# Patient Record
Sex: Female | Born: 1950
Health system: Southern US, Community
[De-identification: ages and names within clinical notes are randomized; demographics above are authoritative.]

## PROBLEM LIST (undated history)

## (undated) DIAGNOSIS — K429 Umbilical hernia without obstruction or gangrene: Secondary | ICD-10-CM

## (undated) DIAGNOSIS — I872 Venous insufficiency (chronic) (peripheral): Secondary | ICD-10-CM

## (undated) DIAGNOSIS — Z8719 Personal history of other diseases of the digestive system: Secondary | ICD-10-CM

## (undated) DIAGNOSIS — D649 Anemia, unspecified: Secondary | ICD-10-CM

## (undated) DIAGNOSIS — I340 Nonrheumatic mitral (valve) insufficiency: Secondary | ICD-10-CM

## (undated) DIAGNOSIS — K449 Diaphragmatic hernia without obstruction or gangrene: Secondary | ICD-10-CM

## (undated) DIAGNOSIS — G5793 Unspecified mononeuropathy of bilateral lower limbs: Secondary | ICD-10-CM

## (undated) DIAGNOSIS — E785 Hyperlipidemia, unspecified: Secondary | ICD-10-CM

## (undated) DIAGNOSIS — IMO0002 Reserved for concepts with insufficient information to code with codable children: Secondary | ICD-10-CM

## (undated) DIAGNOSIS — I739 Peripheral vascular disease, unspecified: Secondary | ICD-10-CM

## (undated) DIAGNOSIS — F32A Depression, unspecified: Secondary | ICD-10-CM

## (undated) DIAGNOSIS — I071 Rheumatic tricuspid insufficiency: Secondary | ICD-10-CM

## (undated) DIAGNOSIS — R011 Cardiac murmur, unspecified: Secondary | ICD-10-CM

## (undated) DIAGNOSIS — M109 Gout, unspecified: Secondary | ICD-10-CM

## (undated) DIAGNOSIS — M1990 Unspecified osteoarthritis, unspecified site: Secondary | ICD-10-CM

## (undated) DIAGNOSIS — F329 Major depressive disorder, single episode, unspecified: Secondary | ICD-10-CM

## (undated) DIAGNOSIS — R0600 Dyspnea, unspecified: Secondary | ICD-10-CM

## (undated) DIAGNOSIS — F1190 Opioid use, unspecified, uncomplicated: Secondary | ICD-10-CM

## (undated) DIAGNOSIS — T8859XA Other complications of anesthesia, initial encounter: Secondary | ICD-10-CM

## (undated) DIAGNOSIS — I509 Heart failure, unspecified: Secondary | ICD-10-CM

## (undated) DIAGNOSIS — R32 Unspecified urinary incontinence: Secondary | ICD-10-CM

## (undated) DIAGNOSIS — I69354 Hemiplegia and hemiparesis following cerebral infarction affecting left non-dominant side: Secondary | ICD-10-CM

## (undated) DIAGNOSIS — G4733 Obstructive sleep apnea (adult) (pediatric): Secondary | ICD-10-CM

## (undated) DIAGNOSIS — R760 Raised antibody titer: Secondary | ICD-10-CM

## (undated) DIAGNOSIS — F419 Anxiety disorder, unspecified: Secondary | ICD-10-CM

## (undated) DIAGNOSIS — N183 Chronic kidney disease, stage 3 unspecified: Secondary | ICD-10-CM

## (undated) DIAGNOSIS — I779 Disorder of arteries and arterioles, unspecified: Secondary | ICD-10-CM

## (undated) DIAGNOSIS — I480 Paroxysmal atrial fibrillation: Secondary | ICD-10-CM

## (undated) DIAGNOSIS — R0789 Other chest pain: Secondary | ICD-10-CM

## (undated) DIAGNOSIS — J45909 Unspecified asthma, uncomplicated: Secondary | ICD-10-CM

## (undated) DIAGNOSIS — E039 Hypothyroidism, unspecified: Secondary | ICD-10-CM

## (undated) DIAGNOSIS — E119 Type 2 diabetes mellitus without complications: Secondary | ICD-10-CM

## (undated) DIAGNOSIS — I82409 Acute embolism and thrombosis of unspecified deep veins of unspecified lower extremity: Secondary | ICD-10-CM

## (undated) DIAGNOSIS — I2721 Secondary pulmonary arterial hypertension: Secondary | ICD-10-CM

## (undated) DIAGNOSIS — K760 Fatty (change of) liver, not elsewhere classified: Secondary | ICD-10-CM

## (undated) DIAGNOSIS — Z87442 Personal history of urinary calculi: Secondary | ICD-10-CM

## (undated) DIAGNOSIS — R609 Edema, unspecified: Secondary | ICD-10-CM

## (undated) DIAGNOSIS — Z8614 Personal history of Methicillin resistant Staphylococcus aureus infection: Secondary | ICD-10-CM

## (undated) DIAGNOSIS — G4730 Sleep apnea, unspecified: Secondary | ICD-10-CM

## (undated) DIAGNOSIS — G4700 Insomnia, unspecified: Secondary | ICD-10-CM

## (undated) DIAGNOSIS — IMO0001 Reserved for inherently not codable concepts without codable children: Secondary | ICD-10-CM

## (undated) DIAGNOSIS — I639 Cerebral infarction, unspecified: Secondary | ICD-10-CM

## (undated) DIAGNOSIS — I5032 Chronic diastolic (congestive) heart failure: Secondary | ICD-10-CM

## (undated) DIAGNOSIS — I82401 Acute embolism and thrombosis of unspecified deep veins of right lower extremity: Secondary | ICD-10-CM

## (undated) DIAGNOSIS — I10 Essential (primary) hypertension: Secondary | ICD-10-CM

## (undated) DIAGNOSIS — Z7901 Long term (current) use of anticoagulants: Secondary | ICD-10-CM

## (undated) DIAGNOSIS — Z8673 Personal history of transient ischemic attack (TIA), and cerebral infarction without residual deficits: Secondary | ICD-10-CM

## (undated) DIAGNOSIS — N200 Calculus of kidney: Secondary | ICD-10-CM

## (undated) DIAGNOSIS — L97529 Non-pressure chronic ulcer of other part of left foot with unspecified severity: Secondary | ICD-10-CM

## (undated) DIAGNOSIS — K219 Gastro-esophageal reflux disease without esophagitis: Secondary | ICD-10-CM

## (undated) DIAGNOSIS — I499 Cardiac arrhythmia, unspecified: Secondary | ICD-10-CM

## (undated) HISTORY — DX: Chronic kidney disease, stage 3 unspecified: N18.30

## (undated) HISTORY — DX: Hypothyroidism, unspecified: E03.9

## (undated) HISTORY — DX: Anxiety disorder, unspecified: F41.9

## (undated) HISTORY — DX: Other chest pain: R07.89

## (undated) HISTORY — DX: Nonrheumatic mitral (valve) insufficiency: I34.0

## (undated) HISTORY — PX: HERNIA REPAIR: SHX51

## (undated) HISTORY — DX: Rheumatic tricuspid insufficiency: I07.1

## (undated) HISTORY — PX: LITHOTRIPSY: SUR834

## (undated) HISTORY — DX: Non-pressure chronic ulcer of other part of left foot with unspecified severity: L97.529

## (undated) HISTORY — DX: Chronic diastolic (congestive) heart failure: I50.32

## (undated) HISTORY — DX: Insomnia, unspecified: G47.00

## (undated) HISTORY — DX: Acute embolism and thrombosis of unspecified deep veins of unspecified lower extremity: I82.409

## (undated) HISTORY — PX: UMBILICAL HERNIA REPAIR: SHX196

## (undated) HISTORY — DX: Venous insufficiency (chronic) (peripheral): I87.2

## (undated) HISTORY — DX: Reserved for concepts with insufficient information to code with codable children: IMO0002

## (undated) HISTORY — DX: Depression, unspecified: F32.A

## (undated) HISTORY — PX: CHOLECYSTECTOMY: SHX55

## (undated) HISTORY — DX: Paroxysmal atrial fibrillation: I48.0

## (undated) HISTORY — PX: KIDNEY STONE SURGERY: SHX686

## (undated) HISTORY — DX: Gastro-esophageal reflux disease without esophagitis: K21.9

## (undated) HISTORY — DX: Calculus of kidney: N20.0

## (undated) HISTORY — PX: TOTAL SHOULDER REPLACEMENT: SUR1217

## (undated) HISTORY — DX: Personal history of transient ischemic attack (TIA), and cerebral infarction without residual deficits: Z86.73

## (undated) HISTORY — DX: Unspecified osteoarthritis, unspecified site: M19.90

## (undated) HISTORY — DX: Secondary pulmonary arterial hypertension: I27.21

## (undated) HISTORY — PX: COLONOSCOPY: SHX174

## (undated) HISTORY — PX: TOTAL KNEE ARTHROPLASTY: SHX125

## (undated) HISTORY — PX: TONSILLECTOMY: SUR1361

## (undated) HISTORY — DX: Disorder of arteries and arterioles, unspecified: I77.9

## (undated) HISTORY — DX: Cardiac murmur, unspecified: R01.1

## (undated) HISTORY — PX: OTHER SURGICAL HISTORY: SHX169

## (undated) HISTORY — DX: Sleep apnea, unspecified: G47.30

## (undated) HISTORY — DX: Unspecified asthma, uncomplicated: J45.909

## (undated) HISTORY — DX: Reserved for inherently not codable concepts without codable children: IMO0001

## (undated) HISTORY — PX: KIDNEY SURGERY: SHX687

## (undated) HISTORY — DX: Type 2 diabetes mellitus without complications: E11.9

## (undated) HISTORY — DX: Major depressive disorder, single episode, unspecified: F32.9

## (undated) HISTORY — DX: Unspecified mononeuropathy of bilateral lower limbs: G57.93

## (undated) HISTORY — PX: ANKLE SURGERY: SHX546

## (undated) HISTORY — PX: HIATAL HERNIA REPAIR: SHX195

## (undated) HISTORY — PX: VAGINAL HYSTERECTOMY: SUR661

## (undated) HISTORY — PX: UPPER GI ENDOSCOPY: SHX6162

## (undated) HISTORY — DX: Cerebral infarction, unspecified: I63.9

## (undated) HISTORY — PX: SLEEVE GASTROPLASTY: SHX1101

---

## 2003-07-16 ENCOUNTER — Other Ambulatory Visit: Payer: Self-pay

## 2003-10-30 ENCOUNTER — Other Ambulatory Visit: Payer: Self-pay

## 2003-12-26 ENCOUNTER — Ambulatory Visit: Payer: Self-pay | Admitting: Anesthesiology

## 2004-02-06 ENCOUNTER — Ambulatory Visit: Payer: Self-pay | Admitting: Anesthesiology

## 2004-02-08 ENCOUNTER — Other Ambulatory Visit: Payer: Self-pay

## 2004-02-12 ENCOUNTER — Ambulatory Visit: Payer: Self-pay

## 2004-04-15 ENCOUNTER — Ambulatory Visit: Payer: Self-pay | Admitting: Internal Medicine

## 2004-04-19 ENCOUNTER — Ambulatory Visit: Payer: Self-pay | Admitting: Internal Medicine

## 2004-05-20 ENCOUNTER — Ambulatory Visit: Payer: Self-pay | Admitting: Anesthesiology

## 2004-09-16 ENCOUNTER — Ambulatory Visit: Payer: Self-pay | Admitting: Urology

## 2004-09-16 ENCOUNTER — Ambulatory Visit: Payer: Self-pay | Admitting: Anesthesiology

## 2004-12-17 ENCOUNTER — Ambulatory Visit: Payer: Self-pay | Admitting: Anesthesiology

## 2004-12-22 ENCOUNTER — Inpatient Hospital Stay: Payer: Self-pay | Admitting: Internal Medicine

## 2005-01-14 ENCOUNTER — Encounter: Admission: RE | Admit: 2005-01-14 | Discharge: 2005-01-14 | Payer: Self-pay | Admitting: Internal Medicine

## 2005-03-23 DIAGNOSIS — I480 Paroxysmal atrial fibrillation: Secondary | ICD-10-CM

## 2005-03-23 HISTORY — DX: Paroxysmal atrial fibrillation: I48.0

## 2005-06-02 ENCOUNTER — Ambulatory Visit: Payer: Self-pay | Admitting: Anesthesiology

## 2005-07-03 ENCOUNTER — Ambulatory Visit: Payer: Self-pay | Admitting: Urology

## 2005-07-16 ENCOUNTER — Other Ambulatory Visit: Payer: Self-pay

## 2005-07-16 ENCOUNTER — Ambulatory Visit: Payer: Self-pay | Admitting: Urology

## 2005-08-24 ENCOUNTER — Ambulatory Visit: Payer: Self-pay | Admitting: Urology

## 2005-11-09 ENCOUNTER — Ambulatory Visit: Payer: Self-pay | Admitting: Anesthesiology

## 2005-11-17 ENCOUNTER — Other Ambulatory Visit: Payer: Self-pay

## 2005-11-19 ENCOUNTER — Ambulatory Visit: Payer: Self-pay | Admitting: Urology

## 2005-12-14 ENCOUNTER — Ambulatory Visit: Payer: Self-pay | Admitting: Urology

## 2006-02-03 ENCOUNTER — Ambulatory Visit: Payer: Self-pay | Admitting: Specialist

## 2006-02-09 ENCOUNTER — Ambulatory Visit: Payer: Self-pay | Admitting: Internal Medicine

## 2006-03-23 DIAGNOSIS — Z8614 Personal history of Methicillin resistant Staphylococcus aureus infection: Secondary | ICD-10-CM

## 2006-03-23 HISTORY — DX: Personal history of Methicillin resistant Staphylococcus aureus infection: Z86.14

## 2006-04-15 ENCOUNTER — Ambulatory Visit: Payer: Self-pay | Admitting: Internal Medicine

## 2006-05-03 ENCOUNTER — Ambulatory Visit: Payer: Self-pay | Admitting: Gastroenterology

## 2006-05-10 ENCOUNTER — Ambulatory Visit: Payer: Self-pay | Admitting: Gastroenterology

## 2006-05-12 ENCOUNTER — Ambulatory Visit: Payer: Self-pay | Admitting: Anesthesiology

## 2006-08-18 ENCOUNTER — Ambulatory Visit: Payer: Self-pay | Admitting: Anesthesiology

## 2006-10-06 ENCOUNTER — Ambulatory Visit: Payer: Self-pay | Admitting: Anesthesiology

## 2006-11-08 ENCOUNTER — Ambulatory Visit: Payer: Self-pay | Admitting: Urology

## 2006-11-24 ENCOUNTER — Ambulatory Visit: Payer: Self-pay | Admitting: Anesthesiology

## 2006-12-08 ENCOUNTER — Ambulatory Visit: Payer: Self-pay | Admitting: Anesthesiology

## 2006-12-08 ENCOUNTER — Other Ambulatory Visit: Payer: Self-pay

## 2006-12-08 ENCOUNTER — Ambulatory Visit: Payer: Self-pay | Admitting: Urology

## 2006-12-09 ENCOUNTER — Ambulatory Visit: Payer: Self-pay | Admitting: Urology

## 2007-01-14 ENCOUNTER — Ambulatory Visit: Payer: Self-pay | Admitting: Internal Medicine

## 2007-02-14 ENCOUNTER — Ambulatory Visit: Payer: Self-pay | Admitting: Anesthesiology

## 2007-02-22 ENCOUNTER — Ambulatory Visit: Payer: Self-pay | Admitting: Gastroenterology

## 2007-03-11 IMAGING — CR DG CHEST 2V
1 series · 2 of 2 positions shown · non-contrast
Comparison: none

REASON FOR EXAM: pneumonia
COMMENTS:

[Series 1: view not recorded · 0.17mm/px · 2 of 2 slices shown]
[im 1/2]
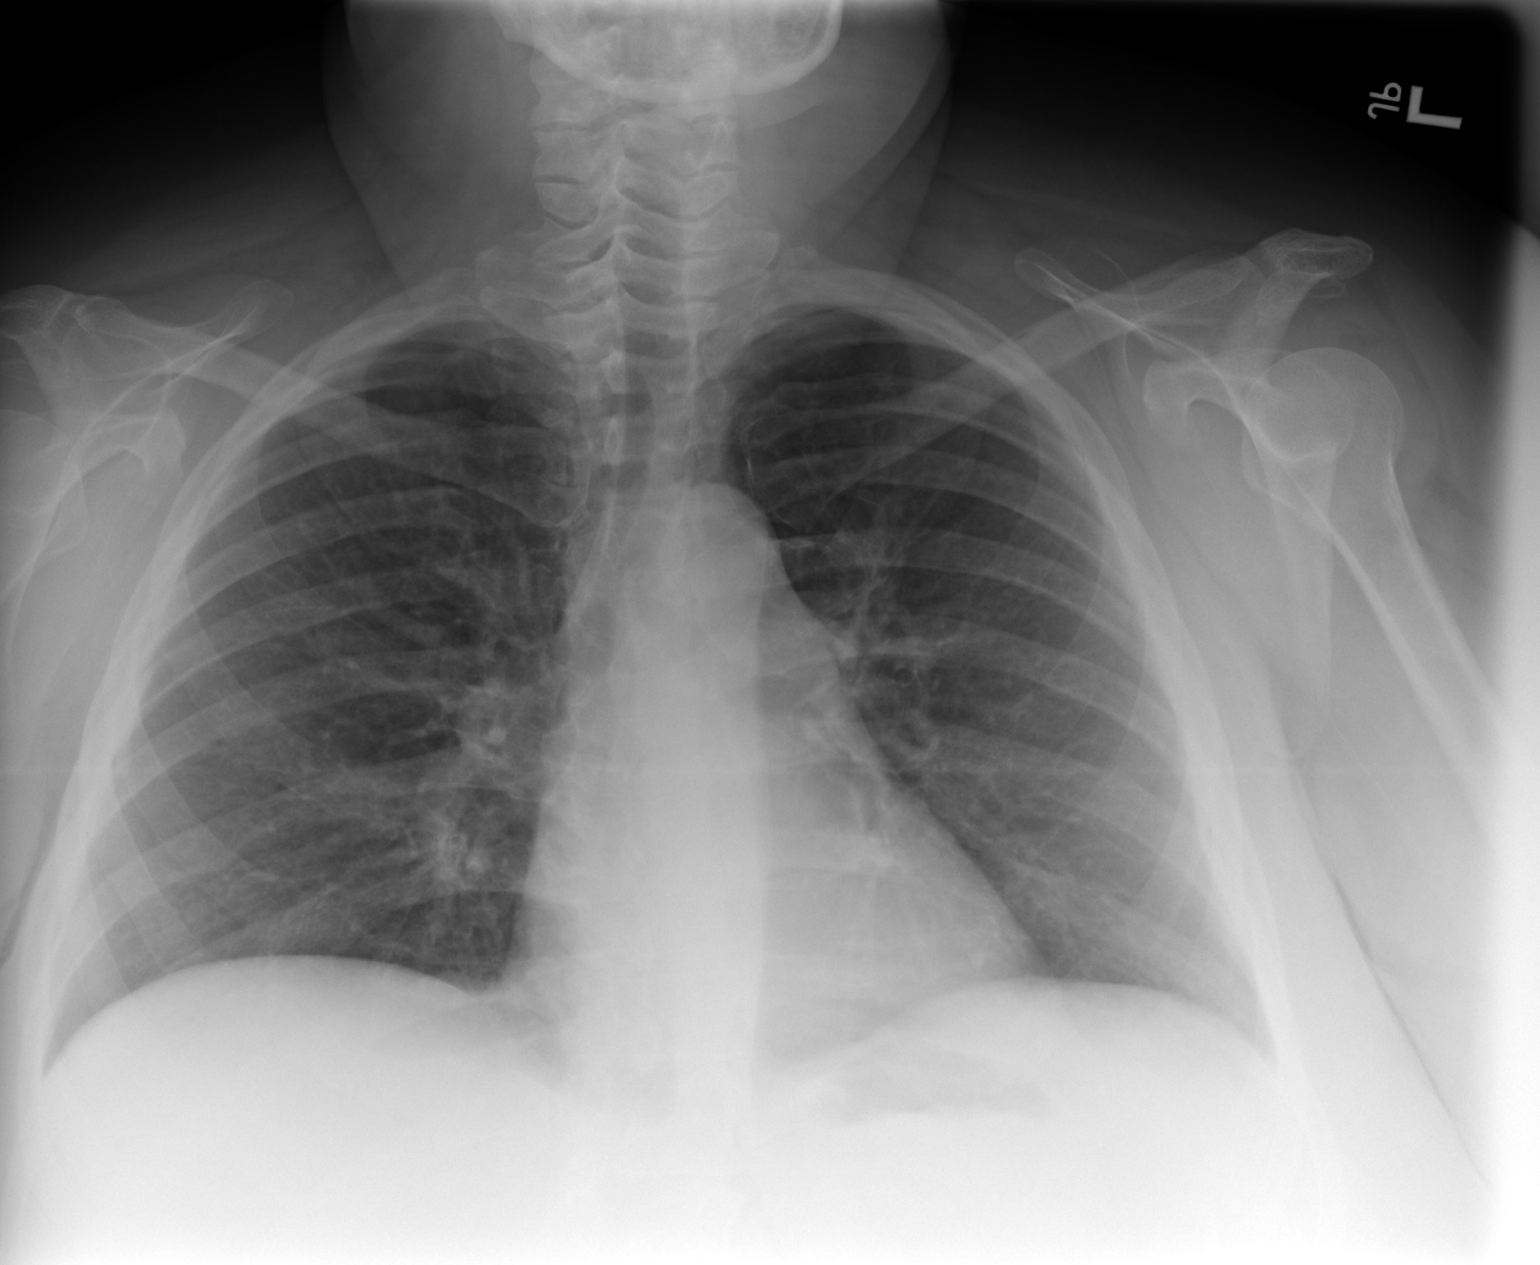
[im 2/2]
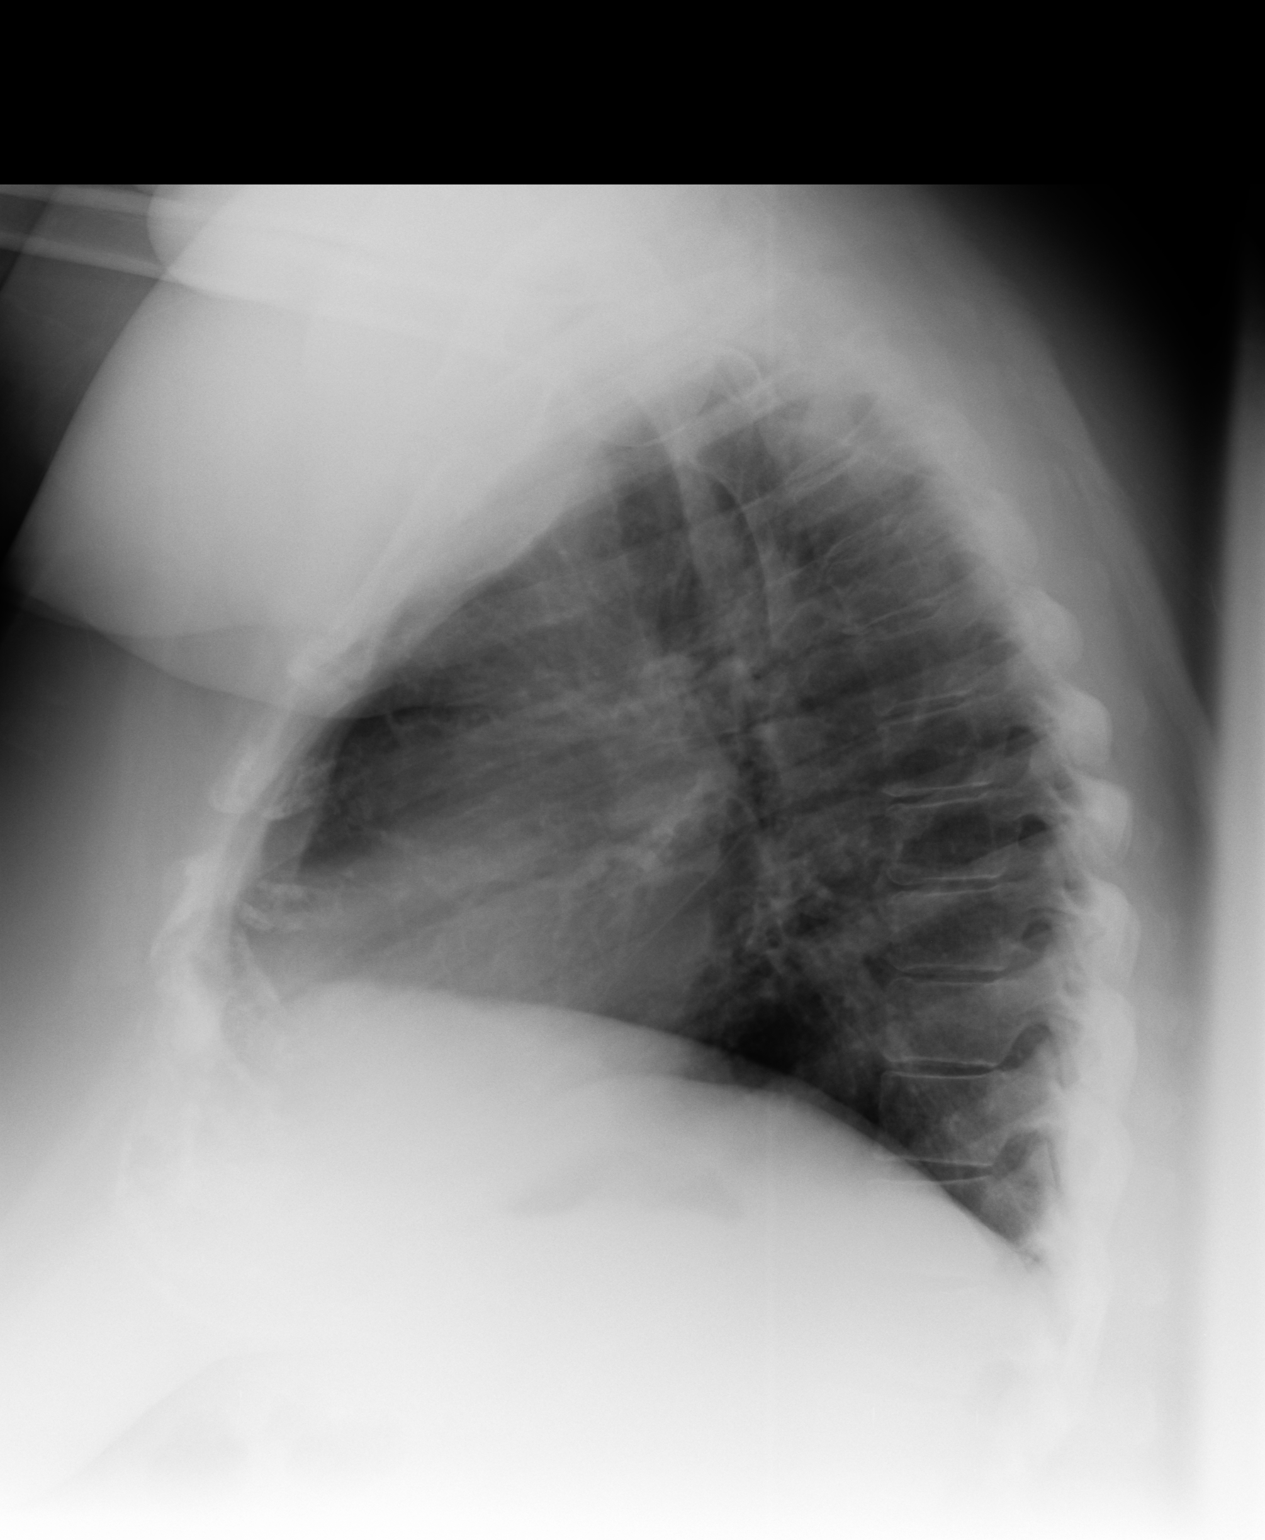

[2 of 2 positions shown; findings below may reference images not displayed]

PROCEDURE:     DXR - DXR CHEST PA (OR AP) AND LATERAL  - December 22, 2004  [DATE]

RESULT:     The current exam is compared to prior exam of 01-07-03.  The
lung fields are clear. Radiographically, no pneumonia is identified on the
current exam.  No pleural effusion or pneumothorax is seen. The heart,
mediastinal and osseous structures are normal in appearance.
IMPRESSION: 1)No significant abnormalities are noted.

## 2007-03-13 IMAGING — CT CT HEAD WITHOUT AND WITH CONTRAST
1 of 2 series · 13 of 30 positions shown, 17 images · non-contrast
Comparison: none

REASON FOR EXAM: Headache
COMMENTS:

[Series 2: without 5.0 h40s · axial · non-contrast · 0.40mm/px · z∈[+218,+343]mm · 13 of 31 slices shown, 17 images]
[im 3/31  brain]
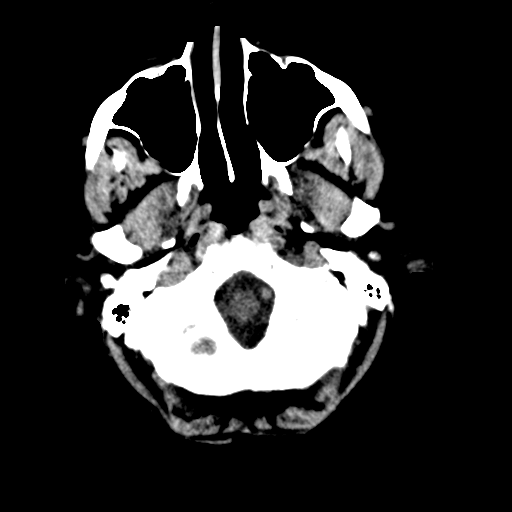
[im 3/31  bone]
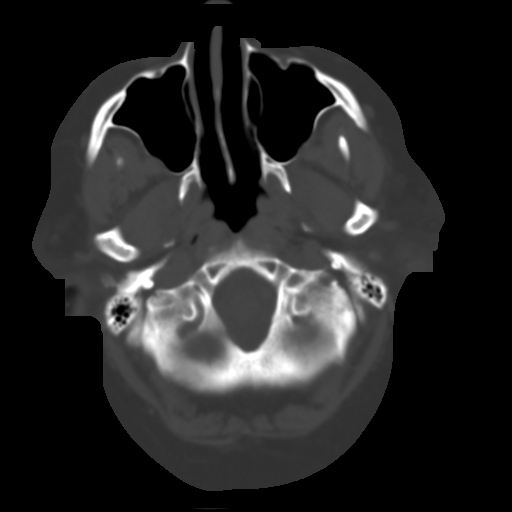
[im 5/31  brain]
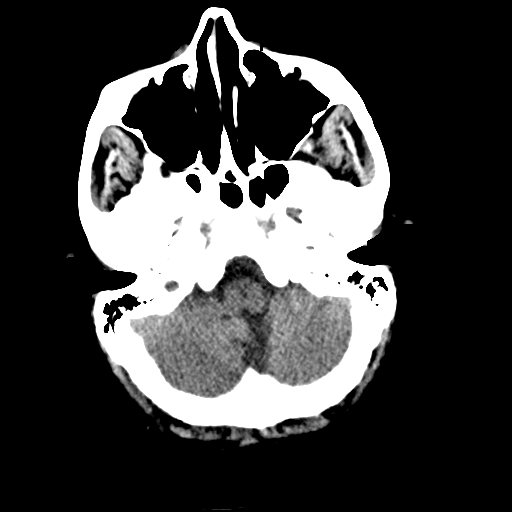
[im 7/31  brain]
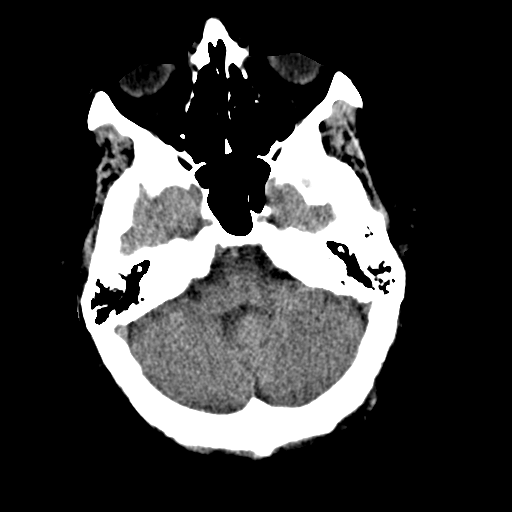
[im 9/31  brain]
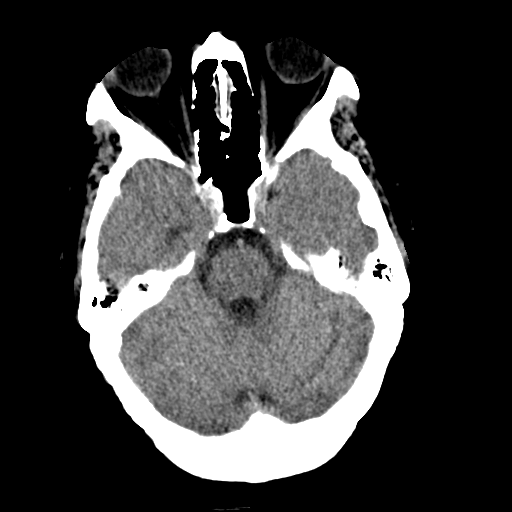
[im 11/31  brain]
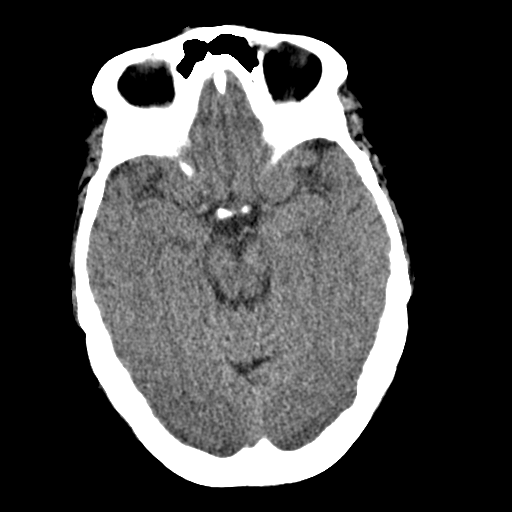
[im 11/31  bone]
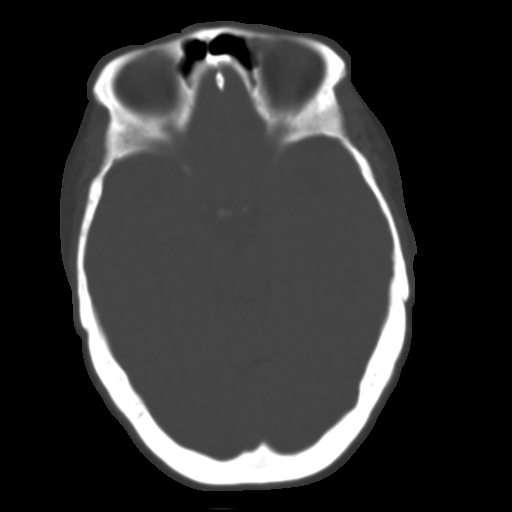
[im 13/31  brain]
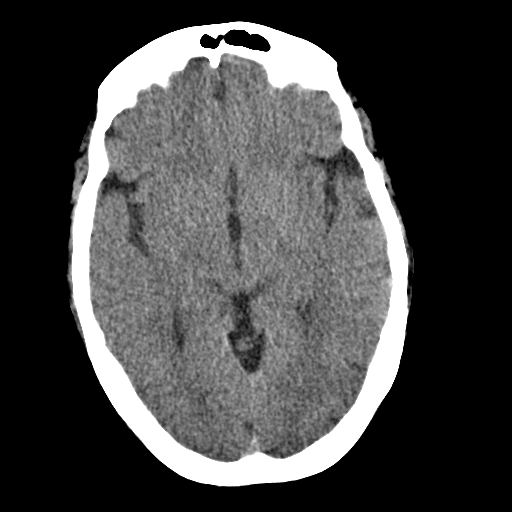
[im 16/31  brain]
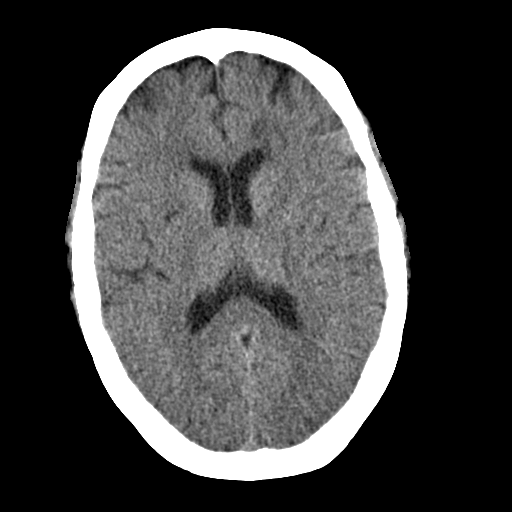
[im 18/31  brain]
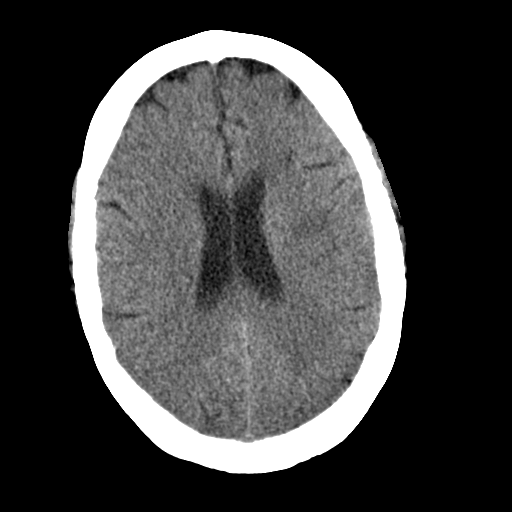
[im 20/31  brain]
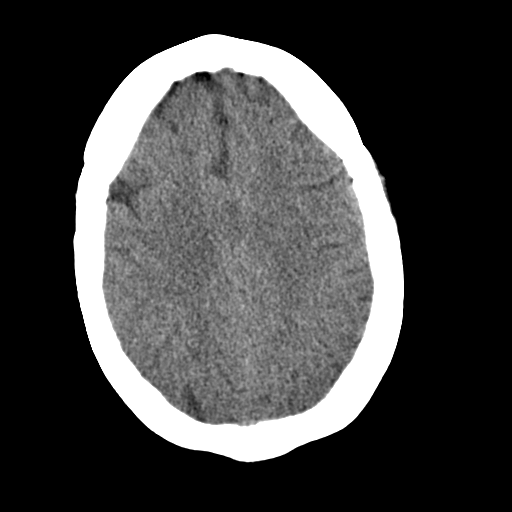
[im 20/31  bone]
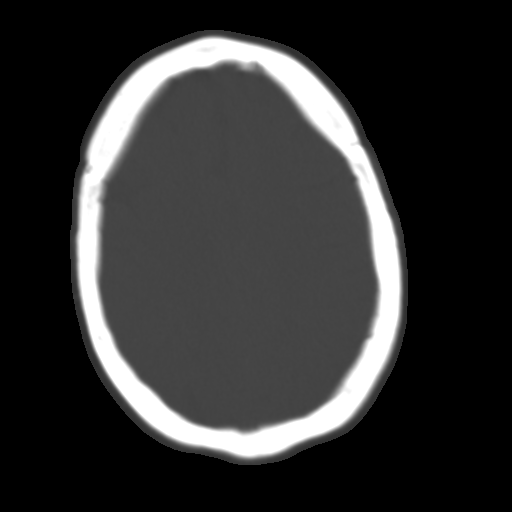
[im 22/31  brain]
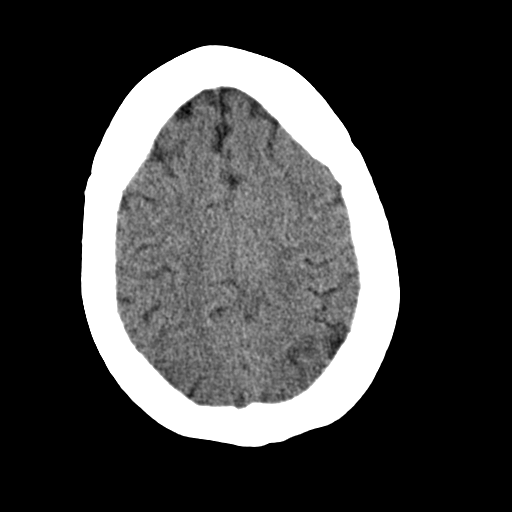
[im 24/31  brain]
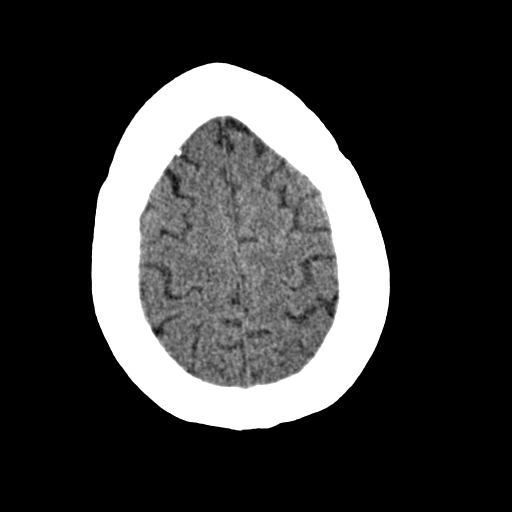
[im 26/31  brain]
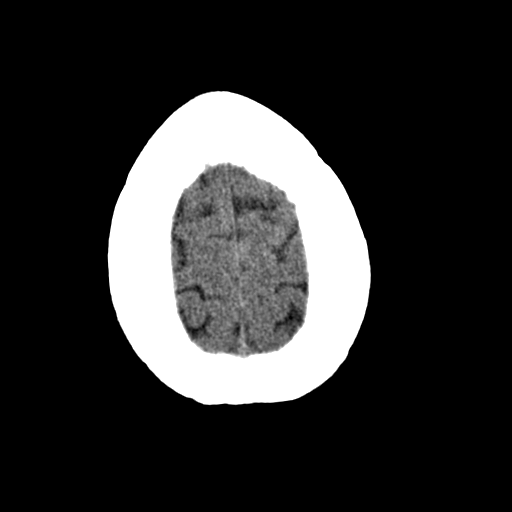
[im 28/31  brain]
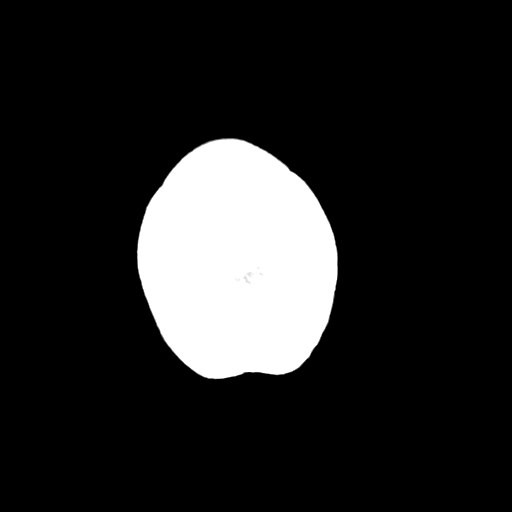
[im 28/31  bone]
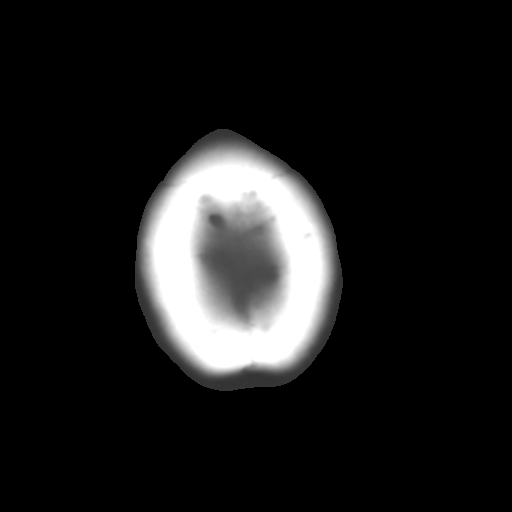

[13 of 30 positions shown; findings below may reference images not displayed]

PROCEDURE:     CT  - CT HEAD W/WO  - December 24, 2004 [DATE]

RESULT:     Pre and post contrast images were obtained of the head pre and
immediate intravenous administration of 50 cc's of Tsovue-DJJ.

There is no evidence of acute hemorrhage. An area of low attenuation
projects along the LEFT apical insular cortex. This does not extend to the
periphery of the gray matter area and appears to be within the white matter
likely representing a region of focal white matter ischemia. A focal region
of infarction other than small vessel ischemia cannot be completely
excluded. A second area of focal low attenuation projects along the anterior
region of the external capsule on the RIGHT. This appears to have the
appearance consistent with a focal region of lacunar infarction. This area
appears subacute to chronic while the area described on the LEFT appears
more subacute. Age related changes are evident demonstrated by mild cortical
atrophy and mild periventricular small vessel white matter ischemia.

Evaluation of the brain status post contrast administration demonstrates no
evidence of enhancing masses or regions of abnormal parenchymal enhancement.
IMPRESSION: 1.     Age related changes.
2.     Findings either representing focal regions of small vessel white
matter disease/lacunar infarction particularly on the RIGHT versus focal
areas of nonlacunar infarction particularly on the LEFT. The region on the
LEFT appears to be more subacute than the RIGHT.

## 2007-04-03 IMAGING — MR MR HEAD WO/W CM
8 of 9 series · 32 of 48 positions shown · IV contrast (omniscan)
Comparison: none

CLINICAL DATA: 54 year old female with headaches and abnormal CT scan performed [HOSPITAL], dated 12/24/04.
MRI BRAIN WITHOUT AND WITH CONTRAST:
TECHNIQUE: Multiplanar and multiecho pulse sequences of the brain and surrounding structures were obtained according to standard protocol before and after administration of intravenous contrast.
Contrast:  20 cc Omniscan.

[Series 2: t1_se_sag · sagittal · 5.0mm · 0.45mm/px · 2 of 19 slices shown]
[im 1/19]
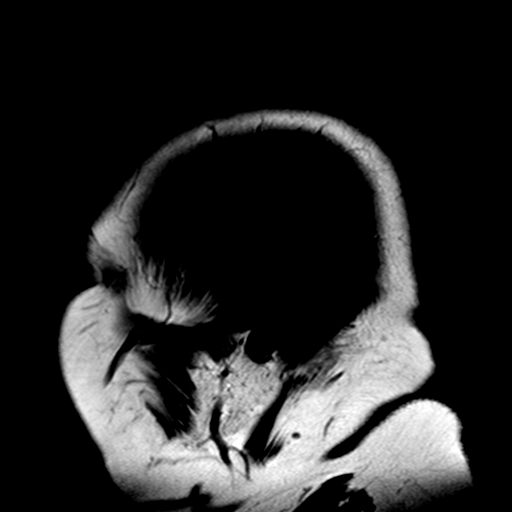
[im 19/19]
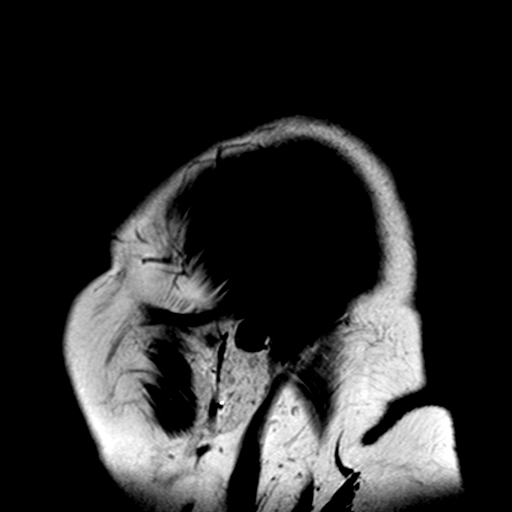

[Series 3: t2_tse_tra_512 · axial · 5.0mm · 0.45mm/px · z∈[-47,+83]mm · 3 of 21 slices shown]
[im 1/21]
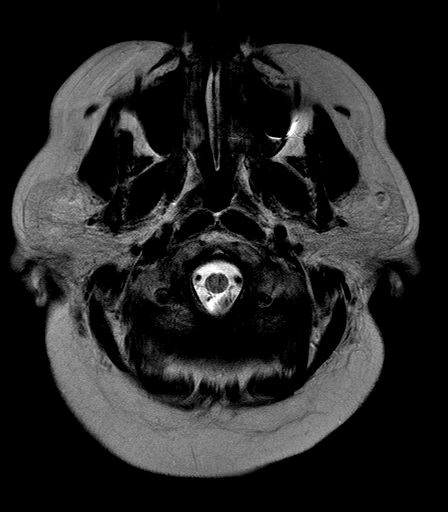
[im 11/21]
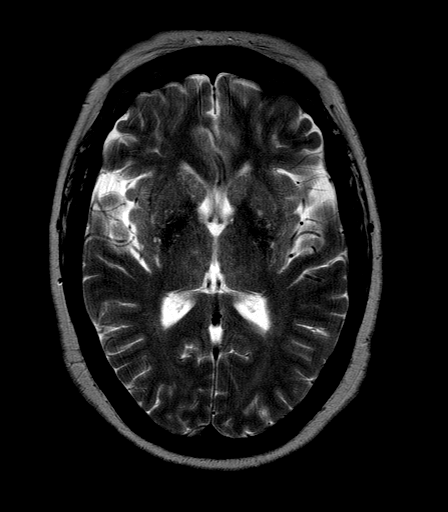
[im 21/21]
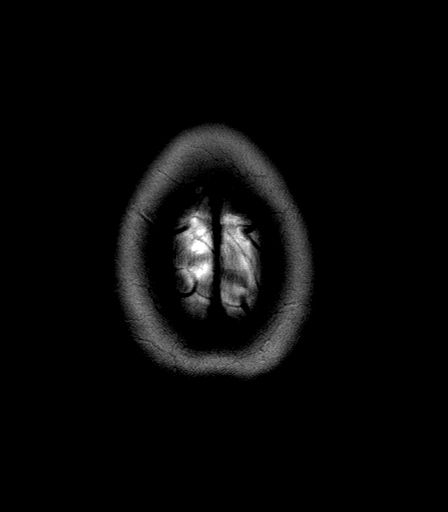

[Series 4: FLAIR · axial · 5.0mm · 0.45mm/px · z∈[-48,+82]mm · 3 of 21 slices shown]
[im 1/21]
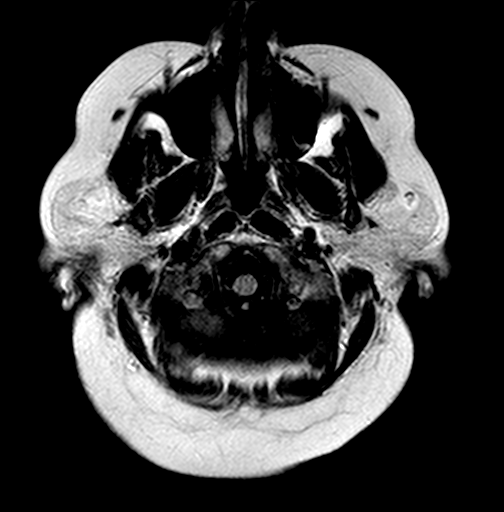
[im 11/21]
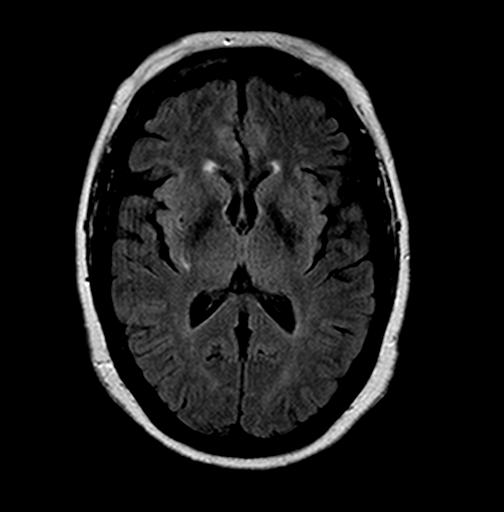
[im 21/21]
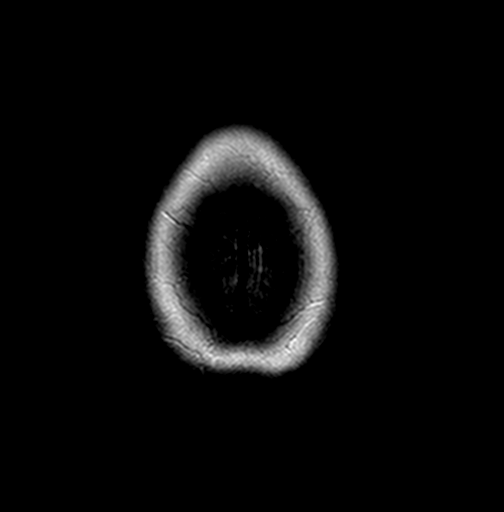

[Series 5: ep2d_diff_(id)_trace · axial · 5.0mm · 1.80mm/px · z∈[-48,+82]mm · 8 of 63 slices shown]
[im 1/63]
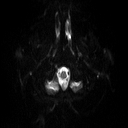
[im 8/63]
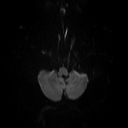
[im 16/63]
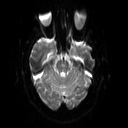
[im 24/63]
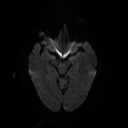
[im 39/63]
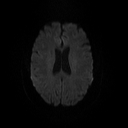
[im 47/63]
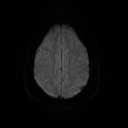
[im 55/63]
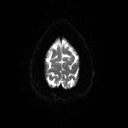
[im 63/63]
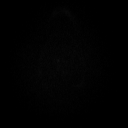

[Series 6: ep2d_diff_(id)_trace_adc · axial · 5.0mm · 1.80mm/px · z∈[-48,+82]mm · 3 of 21 slices shown]
[im 1/21]
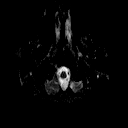
[im 11/21]
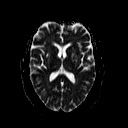
[im 21/21]
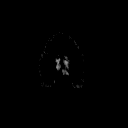

[Series 7: t1_mpr_tra · axial · 2.0mm · 0.45mm/px · z∈[-61,+97]mm · 8 of 80 slices shown]
[im 1/80]
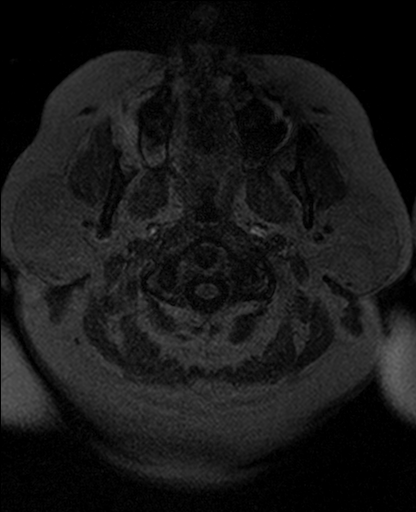
[im 16/80]
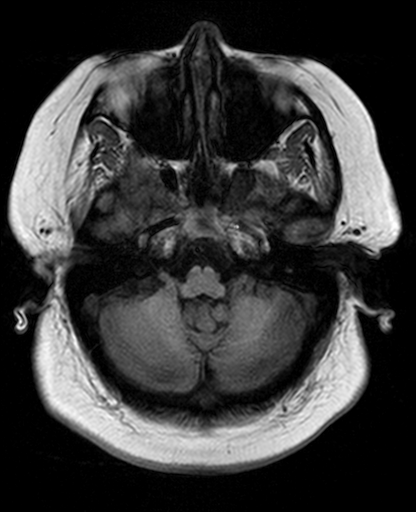
[im 24/80]
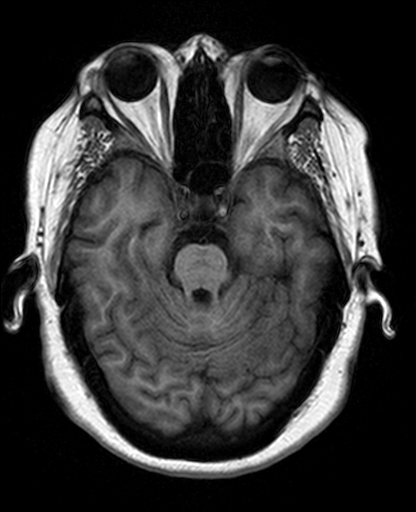
[im 32/80]
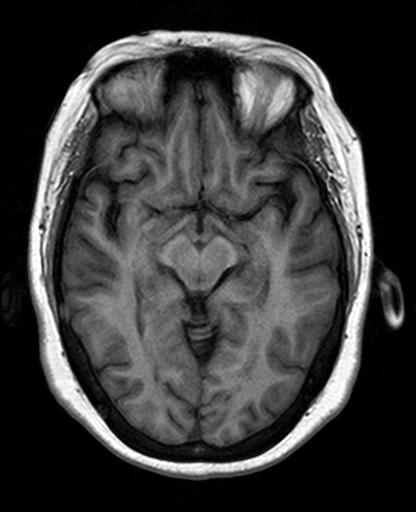
[im 48/80]
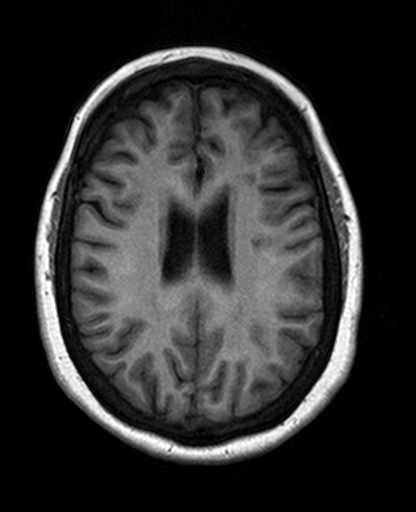
[im 56/80]
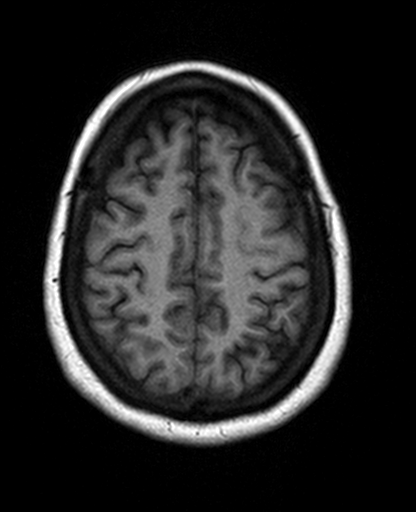
[im 64/80]
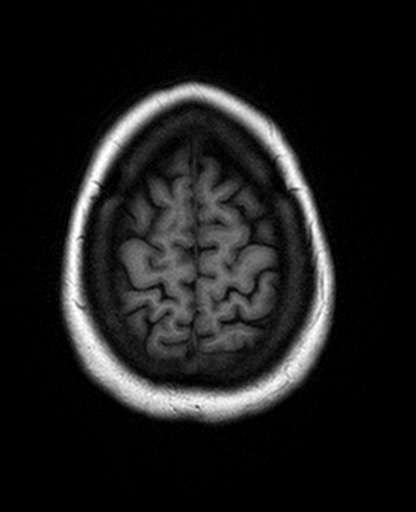
[im 80/80]
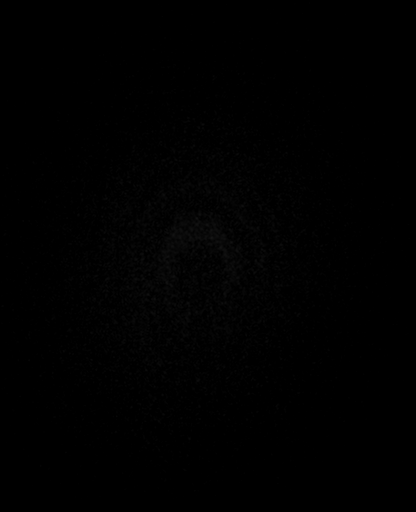

[Series 8: T2 · coronal · non-contrast · 5.0mm · 0.45mm/px · 3 of 22 slices shown]
[im 1/22]
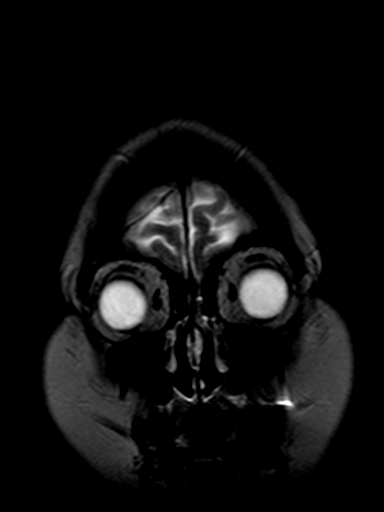
[im 11/22]
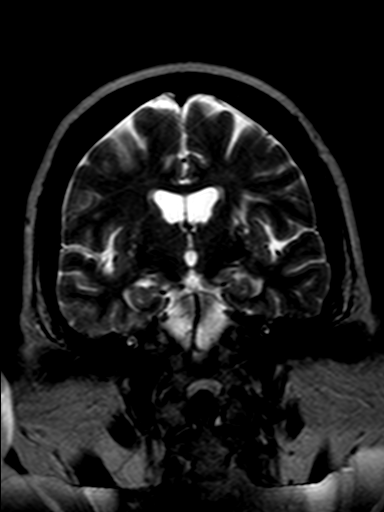
[im 22/22]
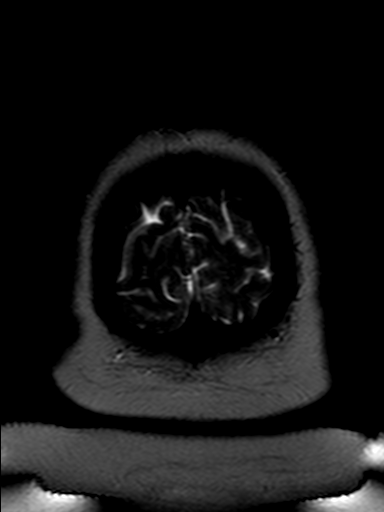

[Series 9: post t1_mpr_tra · axial · 2.0mm · 0.45mm/px · z∈[-61,-31]mm · 2 of 80 slices shown]
[im 1/80]
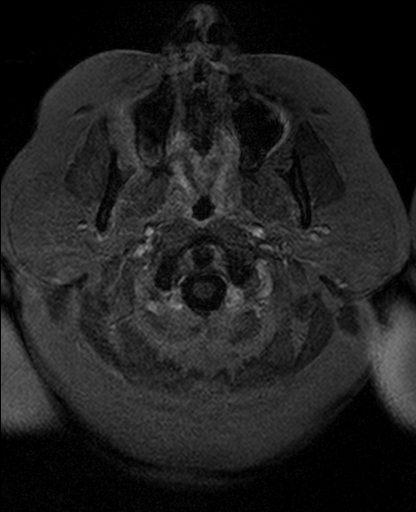
[im 16/80]
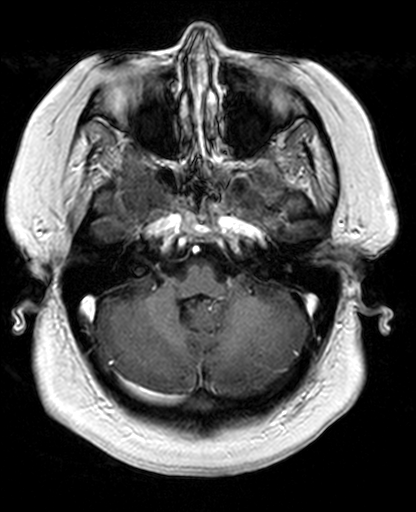

[32 of 48 positions shown; findings below may reference images not displayed]

FINDINGS: Diffusion weighted images demonstrate no evidence for acute infarction.  The areas reported on the previous CT scan within the external capsule and corona radiata bilaterally, left greater than right are compatible with remote lacunar infarcts.  There is only minimal subcortical white matter disease otherwise.  No acute intracranial abnormality is seen.  Specifically, there is no evidence for acute infarct, hemorrhage, mass, hydrocephalus or extra-axial fluid collection.  Flow is present in the major intracranial arteries and dural sinuses.  There is mucosal thickening involving the sphenoid sinuses bilaterally and posterior ethmoid air cells on the left.   Globes and orbits are intact. 
Post contrast images demonstrate no areas of abnormal enhancement.
IMPRESSION: 1.  Bilateral remote lacunar infarcts of the corona radiata, left greater than right. 
2.  Slightly greater than expected other subcortical T2 hyperintensities.  These may also represent an ischemic process.  These most likely reflect the sequela of chronic microvascular ischemia.   It is conceivable that all of these lesions are secondary to a demyelinating process although this is considered much less likely given the lack of lesions at the ventricular surface.

## 2007-04-14 ENCOUNTER — Ambulatory Visit: Payer: Self-pay | Admitting: Anesthesiology

## 2007-07-13 ENCOUNTER — Ambulatory Visit: Payer: Self-pay | Admitting: Anesthesiology

## 2007-09-15 ENCOUNTER — Ambulatory Visit: Payer: Self-pay | Admitting: Anesthesiology

## 2007-09-20 IMAGING — CT CT ABDOMEN AND PELVIS WITHOUT AND WITH CONTRAST
2 of 4 series · 13 of 32 positions shown, 18 images · non-contrast
Comparison: none

REASON FOR EXAM: Bladder CA, Pt diabetic on Metformin
COMMENTS:

[Series 2: without · axial · non-contrast · 0.94mm/px · z∈[-600,-248]mm · 8 of 58 slices shown, 13 images]
[im 7/58  soft-tissue]
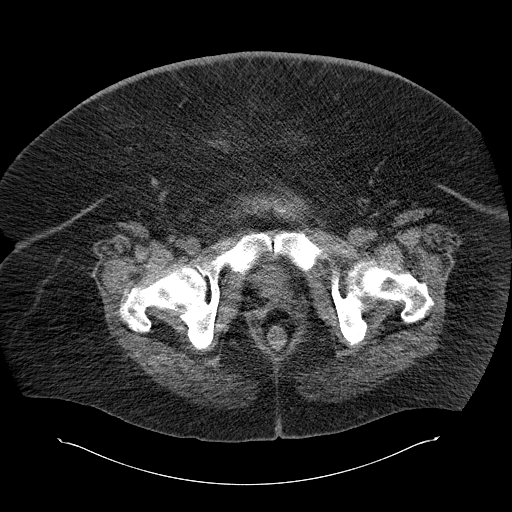
[im 7/58  bone]
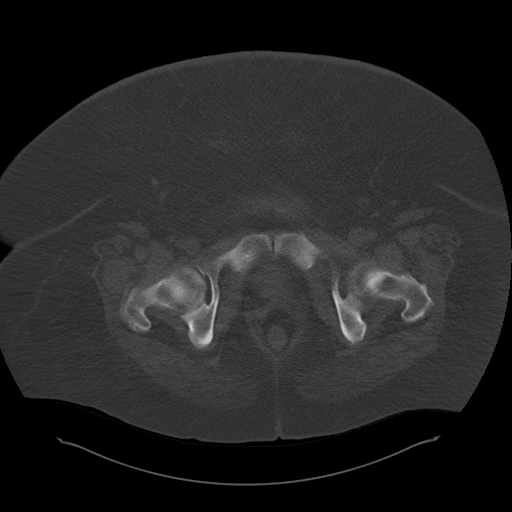
[im 13/58  soft-tissue]
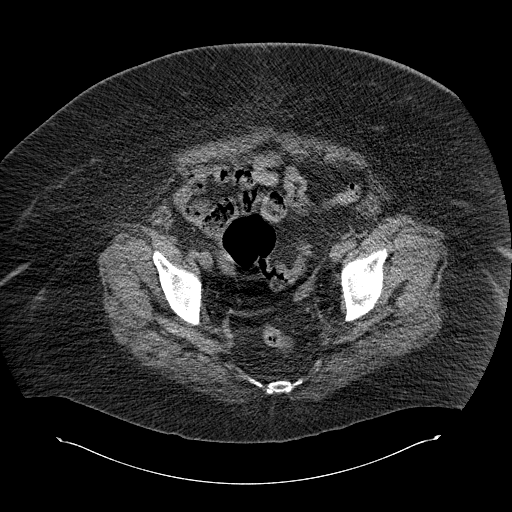
[im 20/58  soft-tissue]
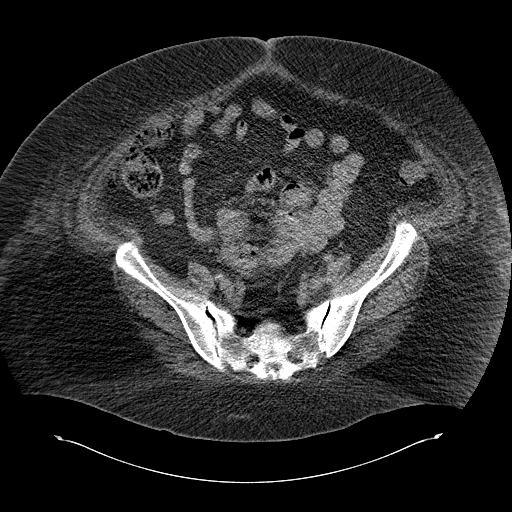
[im 26/58  soft-tissue]
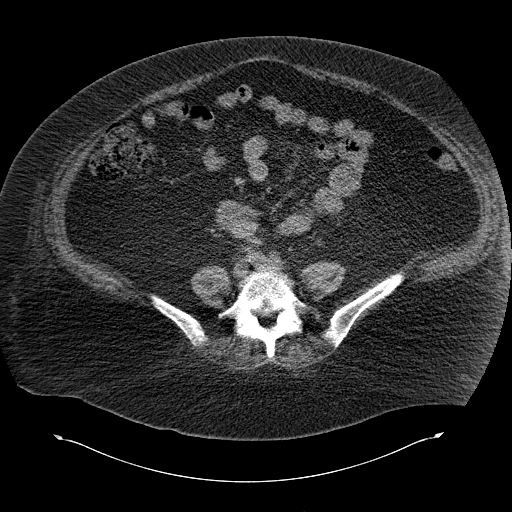
[im 32/58  soft-tissue]
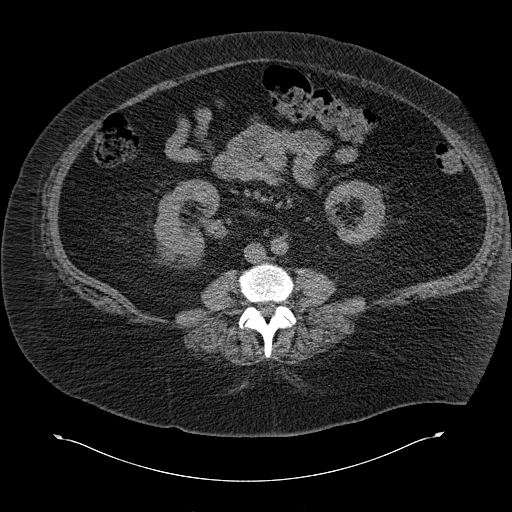
[im 32/58  lung]
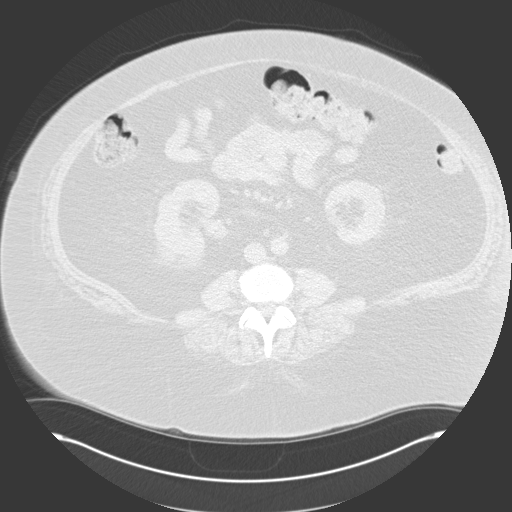
[im 39/58  soft-tissue]
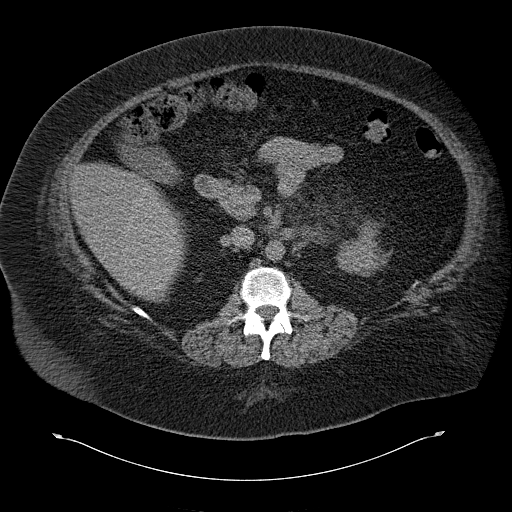
[im 39/58  lung]
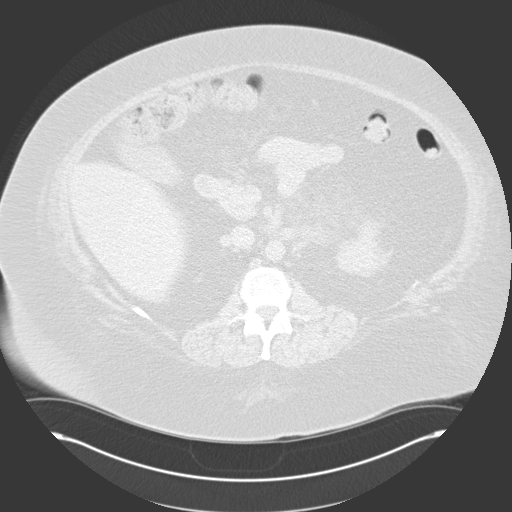
[im 45/58  soft-tissue]
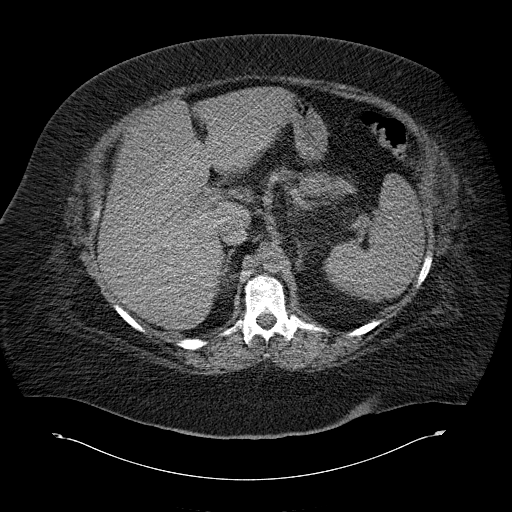
[im 45/58  lung]
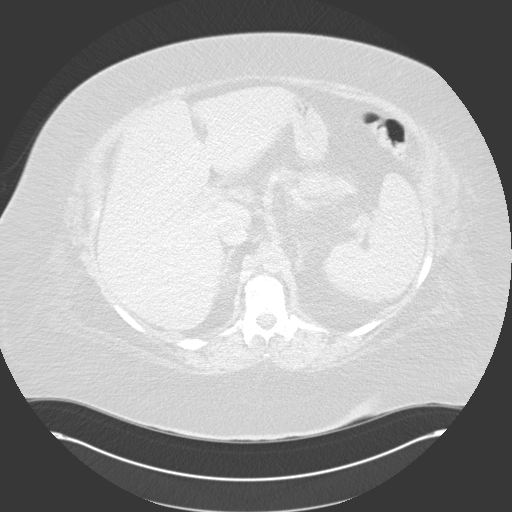
[im 51/58  soft-tissue]
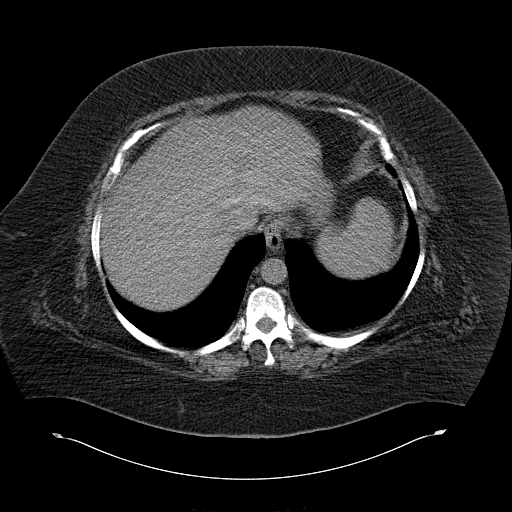
[im 51/58  lung]
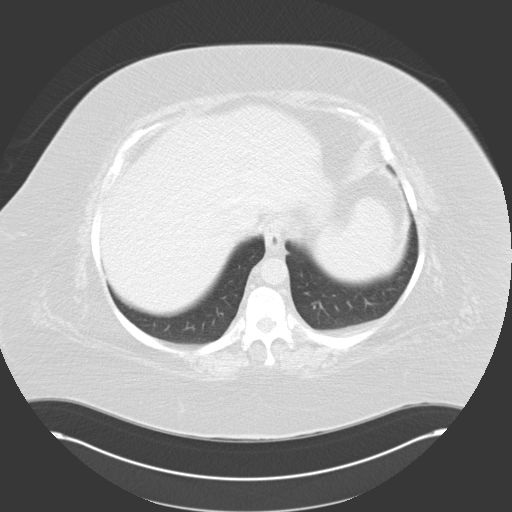

[Series 4: with · axial · 0.94mm/px · z∈[-600,-400]mm · 5 of 58 slices shown]
[im 7/58  soft-tissue]
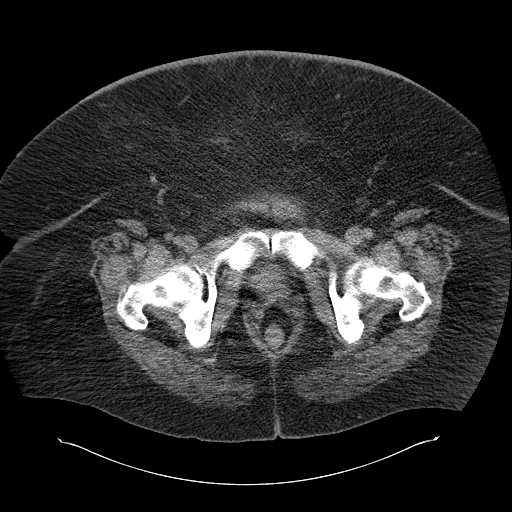
[im 13/58  soft-tissue]
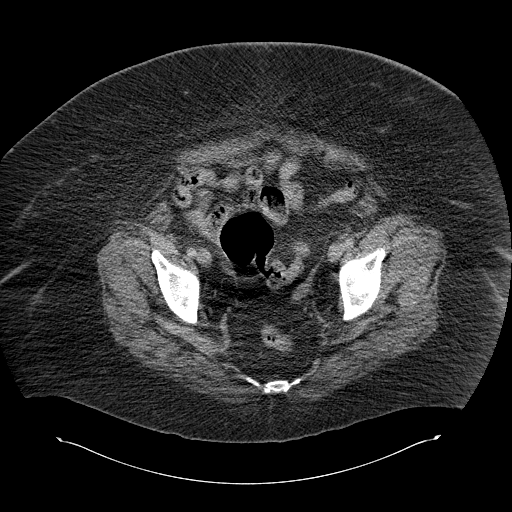
[im 20/58  soft-tissue]
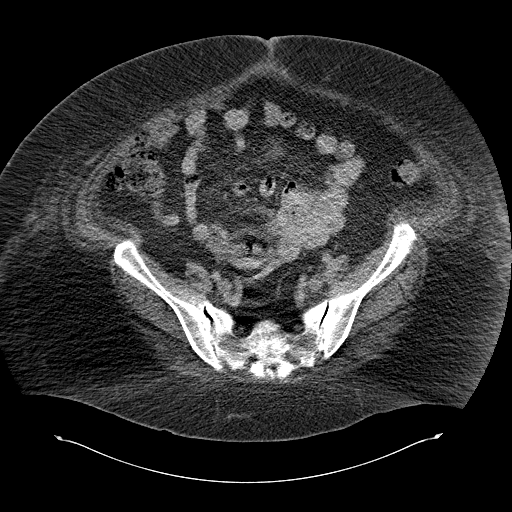
[im 26/58  soft-tissue]
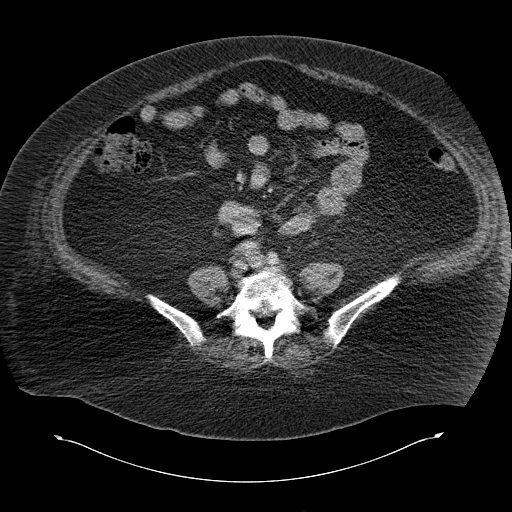
[im 32/58  soft-tissue]
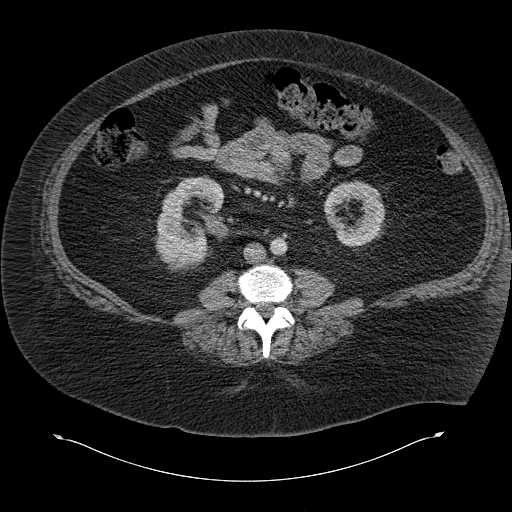

[13 of 32 positions shown; findings below may reference images not displayed]

PROCEDURE:     CT  - CT ABDOMEN / PELVIS  W/WO  - July 03, 2005  [DATE]

RESULT:          Spiral 8 mm sections were obtained from the lung bases
through the pubic symphysis pre, immediate and delay intravenous
administration of 100 ml of Asovue-Q77. (This study was compared to a
previous study dated 09/16/2004).

Evaluation of the lung bases demonstrates no gross abnormalities.

Evaluation of the RIGHT kidney demonstrates small calcified densities
projecting within the renal pelvis.  These appear to correlate with the
previously described calcifications within the posterior central pole
medullary portion of the RIGHT kidney and appear to have migrated into the
region of the renal pelvis.  No further RIGHT renal calculi are identified.
There is dilatation of the renal pelvis without evidence of hydronephrosis.
No evidence of RIGHT renal mass is appreciated.  Evaluation of the LEFT
kidney demonstrates no evidence of hydronephrosis, calculi, nor masses.

Evaluation of the liver, spleen, and LEFT adrenal is unremarkable.

Evaluation of the RIGHT adrenal demonstrates a mass with Hounsfield units of
58, unchanged when compared to the previous study, indicative of a lipoma or
possibly an adrenal adenoma.  Each would represent benign entities.  The
pancreas once again demonstrates an area of mild stranding within the
peripancreatic fat which almost has a loculated appearance.  There appears
to be evidence of possible mild free fluid within this area.  These findings
are unchanged when compared to the previous study dated 09/16/2004 and may
represent the sequela of chronic pancreatic inflammation or possibly
scarring resulting from previous pancreatic inflammation and/or infection.
Alternatively, there does appear to be an element of a peripheral wall.
This area may represent the sequela of a lipoma.  The area has been stable
over various studies at least since 4440, which appears to be consistent
with a benign finding.  Evaluation of the pelvis demonstrates no evidence of
free fluid, drainable loculated fluid collections, masses nor adenopathy.
There is mild diverticulosis within the pelvis.  This study is degraded by
artifact secondary to the patient's body habitus.  The patient is morbidly
obese.
IMPRESSION: 1.     Nonobstructing calculi involving the RIGHT kidney.
2.     No evidence of renal masses nor hydronephrosis.
3.     Findings which appear to be consistent with a RIGHT adrenal lipoma
versus an adenoma.
4.     Stable, loculated area of mixed fat in the region of the pancreatic
tail.  Again this may represent chronic inflammation of the pancreatic tail
or changes secondary to prior inflammation, or possibly even an etiology
such as lipoma; and this area does appear to be stable as compared to
previous studies dated 09/16/2004, 08/30/2003, and 06/12/2003.

## 2007-11-11 IMAGING — CT CT STONE STUDY
1 of 2 series · 14 of 32 positions shown, 18 images · non-contrast
Comparison: none

REASON FOR EXAM: Kidney Stone
COMMENTS:

[Series 3: soft tissue · axial · 0.89mm/px · z∈[-759,-381]mm · 14 of 143 slices shown, 18 images]
[im 11/143  soft-tissue]
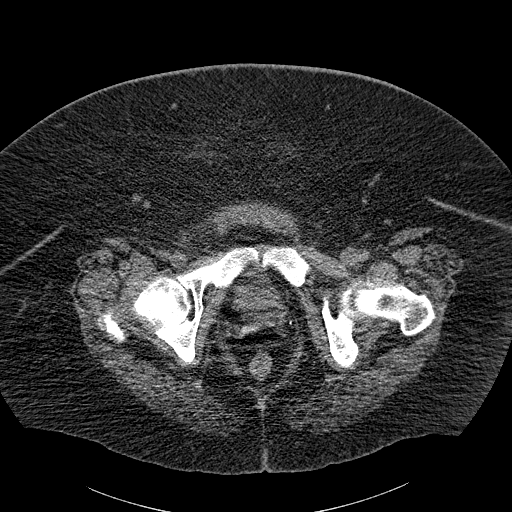
[im 11/143  bone]
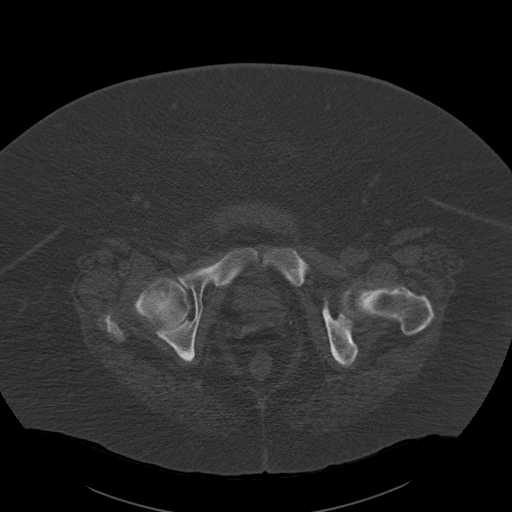
[im 22/143  soft-tissue]
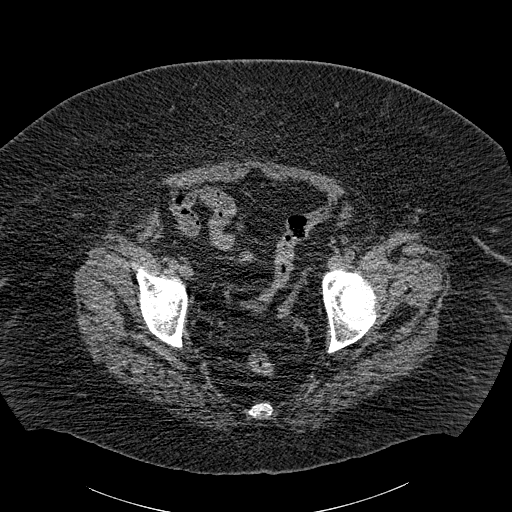
[im 32/143  soft-tissue]
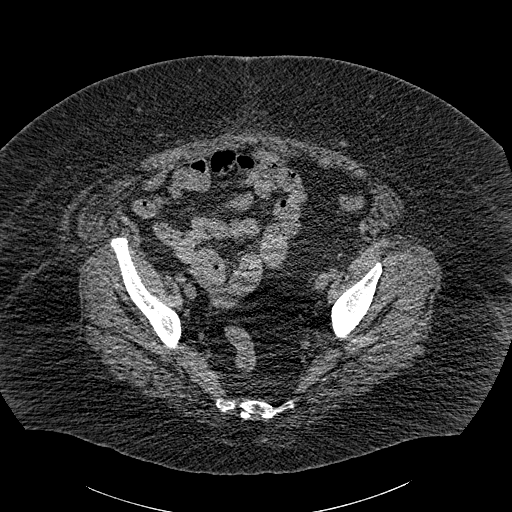
[im 43/143  soft-tissue]
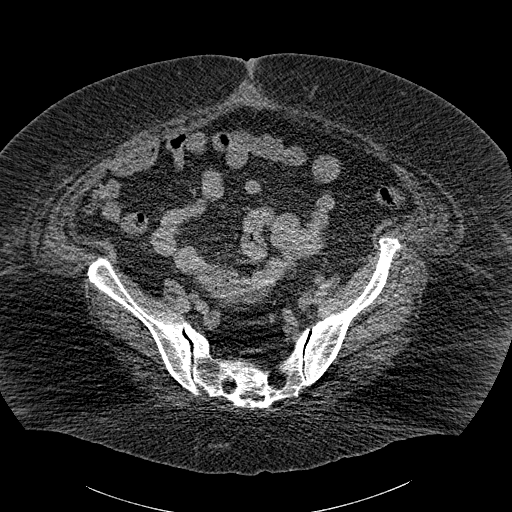
[im 53/143  soft-tissue]
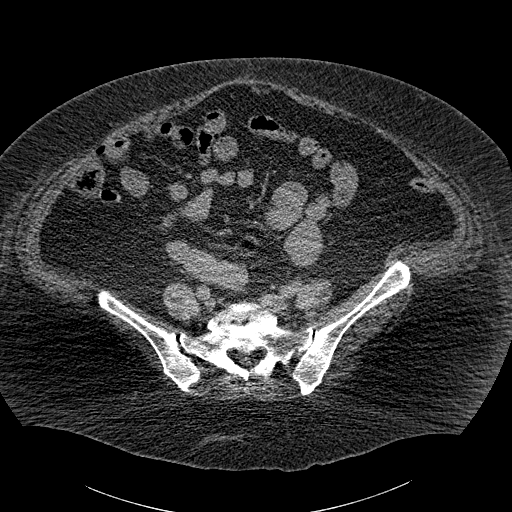
[im 64/143  soft-tissue]
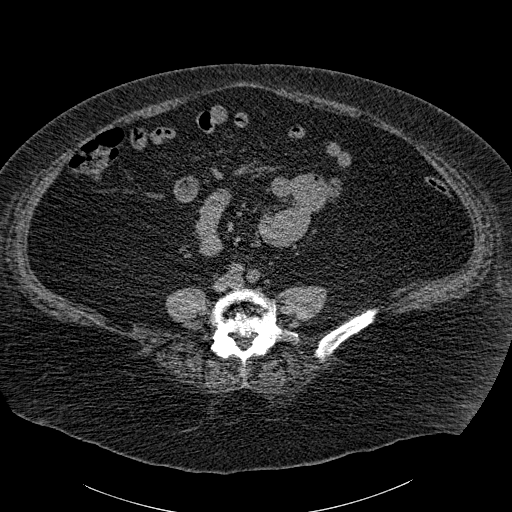
[im 79/143  soft-tissue]
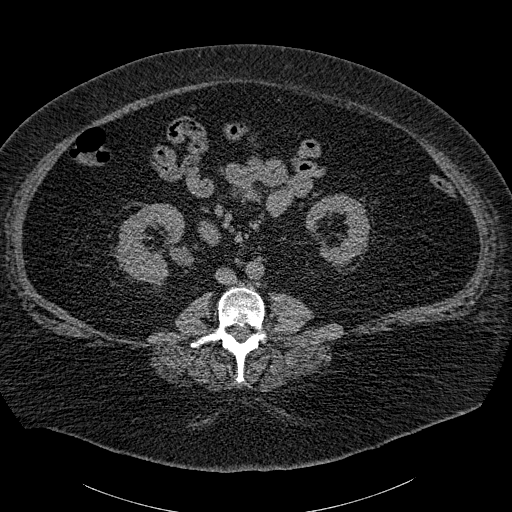
[im 90/143  soft-tissue]
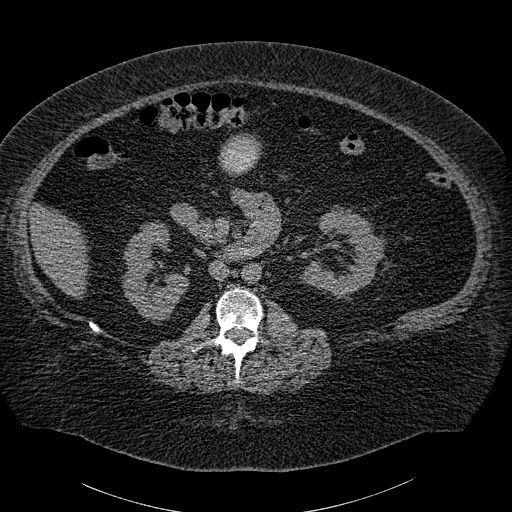
[im 100/143  soft-tissue]
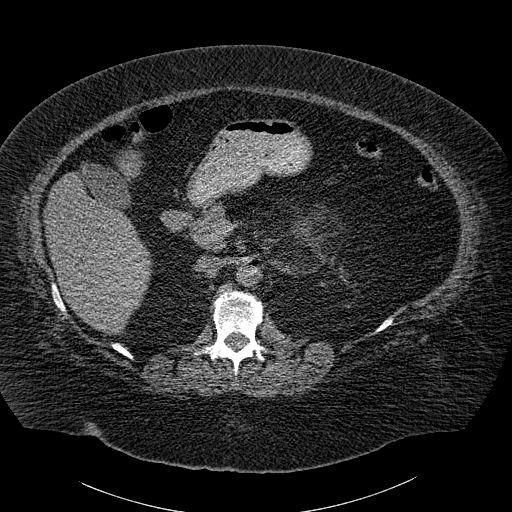
[im 100/143  bone]
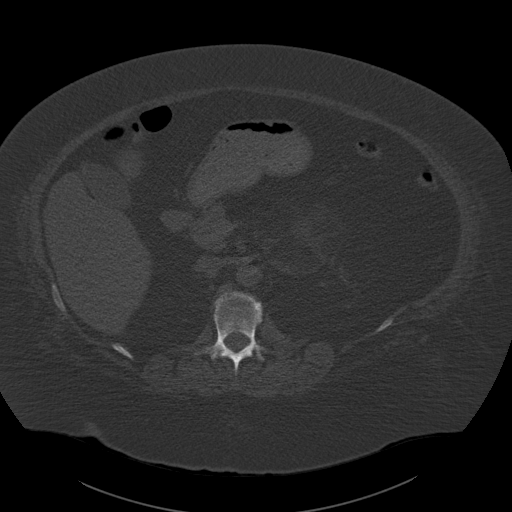
[im 111/143  soft-tissue]
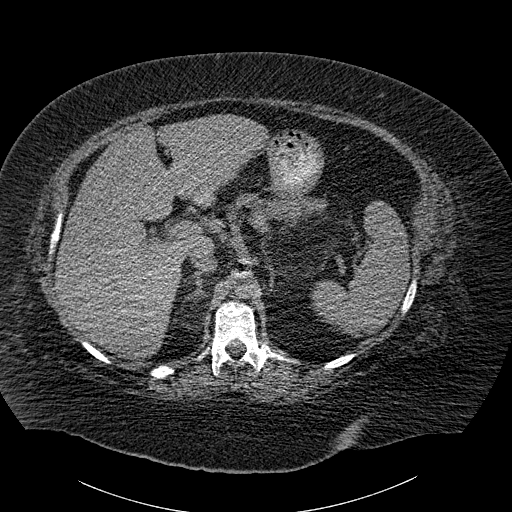
[im 121/143  soft-tissue]
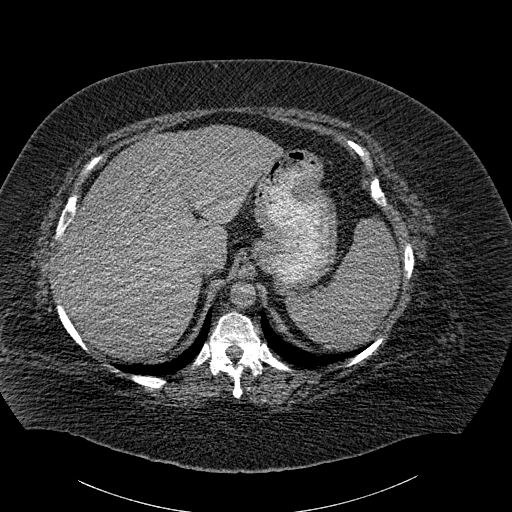
[im 121/143  lung]
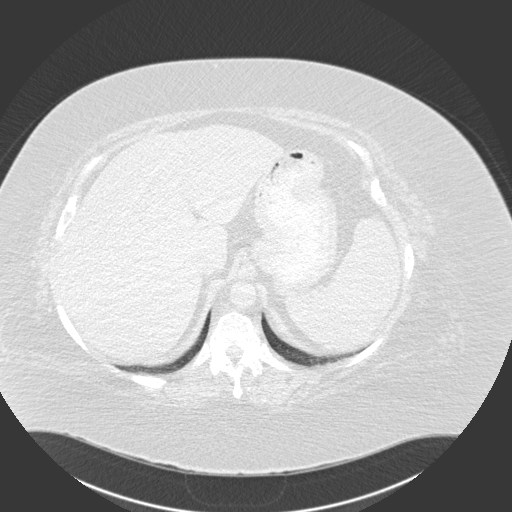
[im 127/143  lung]
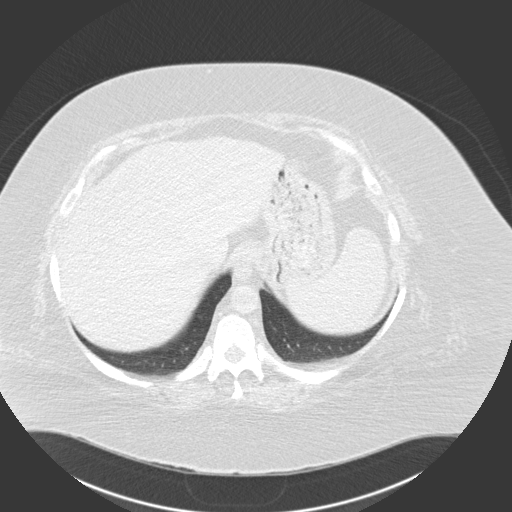
[im 132/143  soft-tissue]
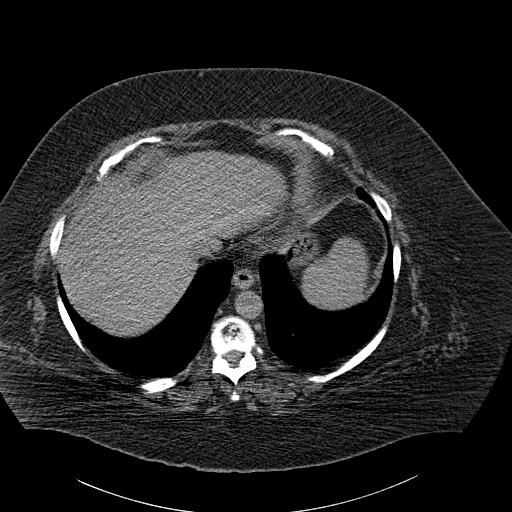
[im 132/143  lung]
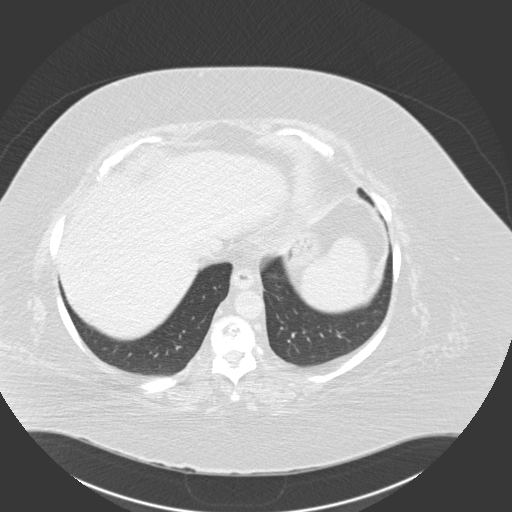
[im 137/143  lung]
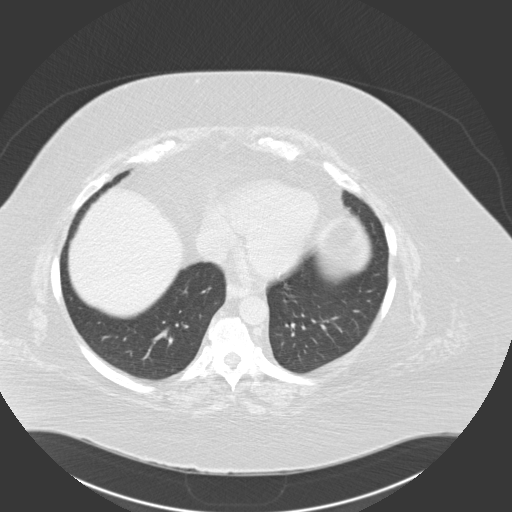

[14 of 32 positions shown; findings below may reference images not displayed]

PROCEDURE:     CT  - CT ABDOMEN /PELVIS WO (STONE)  - August 24, 2005  [DATE]

RESULT:        The patient is being evaluated for RIGHT flank and RIGHT
lower quadrant discomfort.  The study was tailored to evaluate the patient
for urinary tract stones.

There is a calcified stone in the renal pelvis distally on the RIGHT that
appears to lie just proximal to the ureteropelvic junction.  It measures
approximately 6 mm in greatest dimension.  It is not currently producing
obstruction but, if mobile could be expected to intermittently obstruct the
ureteropelvic junction.  There is a punctate calcification in the midpole
cortex of the RIGHT kidney.  The perinephric fat is normal in appearance
bilaterally.  The LEFT renal collecting system is not obstructed.  Along the
expected course of both ureters I see no abnormal calcifications.  The
urinary bladder is nondistended. There is a phlebolith in the LEFT aspect of
the pelvis.  There is no free fluid in the pelvis.  The sigmoid colon and
rectum are normal in appearance.

The unopacified loops of small and large bowel exhibit no acute abnormality.
 There is a structure that likely reflects the appendix seen on images 99
through 103.  No inflammatory change is seen surrounding it.  The caliber of
the abdominal aorta is normal.  The liver, spleen, and partially distended
stomach are normal.  There is a tiny calcified gallstone within the
gallbladder. The gallbladder itself is only partially distended.  The
pancreas is normal in appearance.  I see no adrenal masses.
IMPRESSION: 1.     There is a 6 mm diameter densely calcified stone in the renal pelvis
on the RIGHT just proximal to the ureteropelvic junction.  It is not
currently producing obstruction but could certainly produce such obstruction
if the patient was in the upright position.  No significant change in the
surrounding perinephric fat is seen.  There is a punctate midpole
calcification on the RIGHT.  The LEFT kidney is normal in appearance.
2.     I do not see acute abnormality elsewhere within the abdomen or
pelvic.  There is a punctate calcification in the gallbladder lumen that
likely reflects a tiny stone.

## 2008-01-09 ENCOUNTER — Ambulatory Visit: Payer: Self-pay | Admitting: Anesthesiology

## 2008-03-02 IMAGING — CT CT ABDOMEN AND PELVIS WITHOUT AND WITH CONTRAST
2 of 4 series · 14 of 32 positions shown, 19 images · non-contrast
Comparison: none

REASON FOR EXAM: Right Flank Pain  Diabetic Metformin
COMMENTS:

PROCEDURE:     CT  - CT ABDOMEN / PELVIS  W/WO  - December 14, 2005  [DATE]
RESULT:
REASON FOR CONSULTATION: RIGHT flank pain.
TECHNIQUE: Axial images are obtained from the hemidiaphragm to the pubic
symphysis pre and post intravenous injection of contrast material.

[Series 2: without · axial · non-contrast · 0.90mm/px · z∈[+486,+854]mm · 8 of 60 slices shown, 13 images]
[im 7/60  soft-tissue]
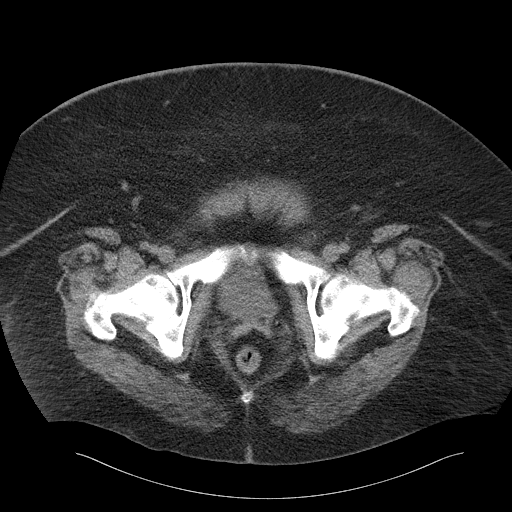
[im 7/60  bone]
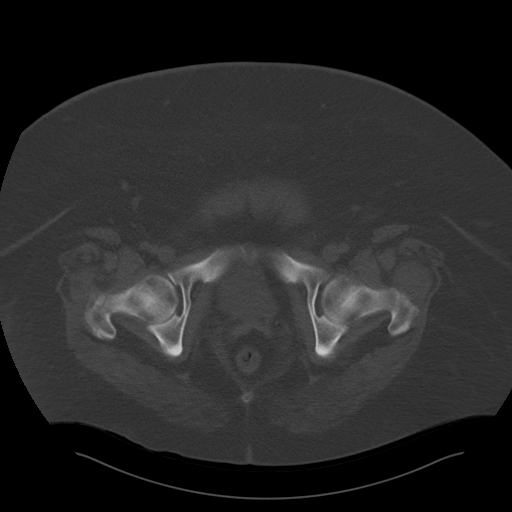
[im 14/60  soft-tissue]
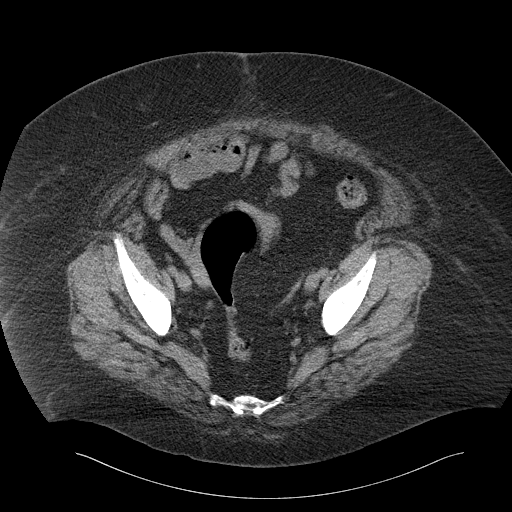
[im 20/60  soft-tissue]
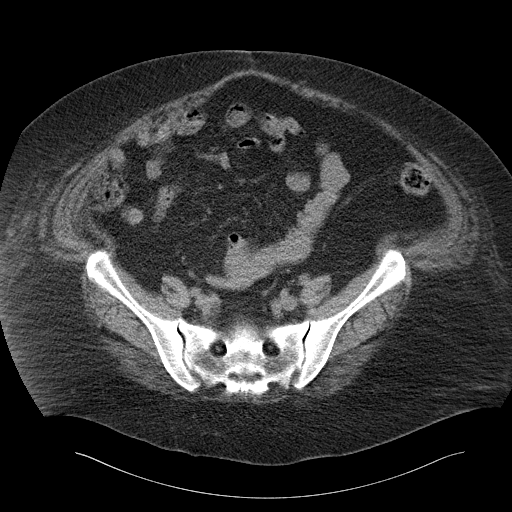
[im 27/60  soft-tissue]
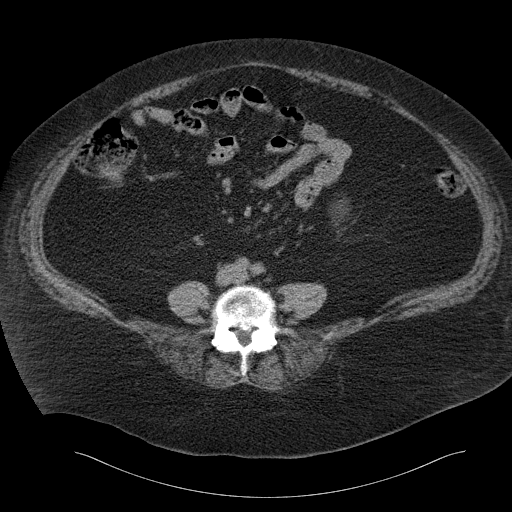
[im 33/60  soft-tissue]
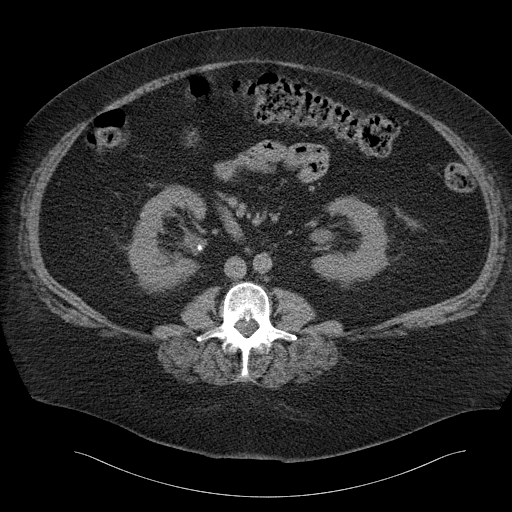
[im 33/60  lung]
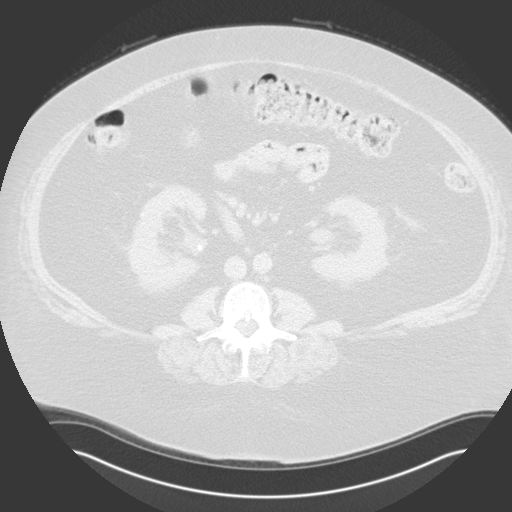
[im 40/60  soft-tissue]
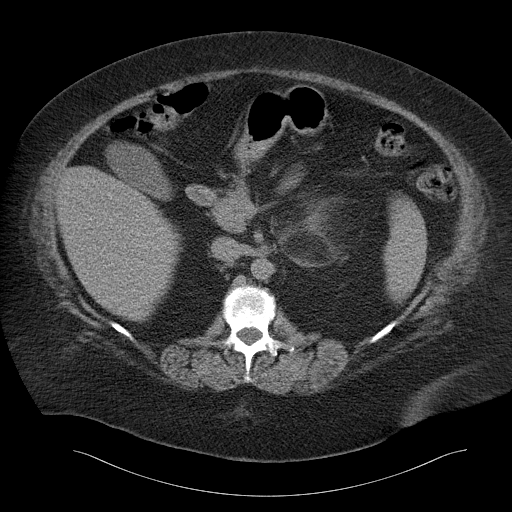
[im 40/60  lung]
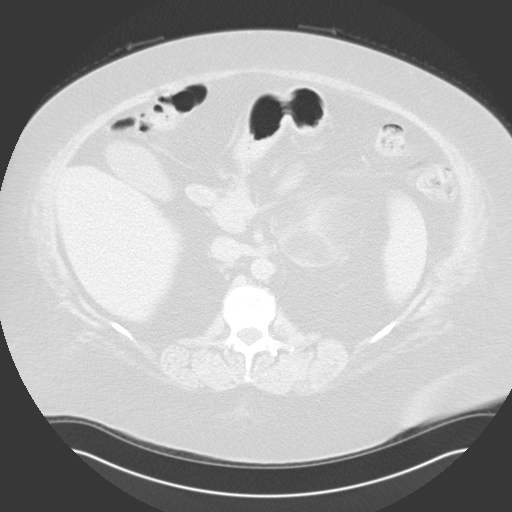
[im 46/60  soft-tissue]
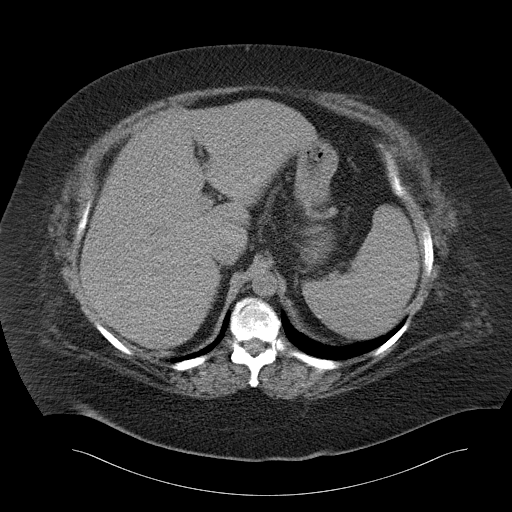
[im 46/60  lung]
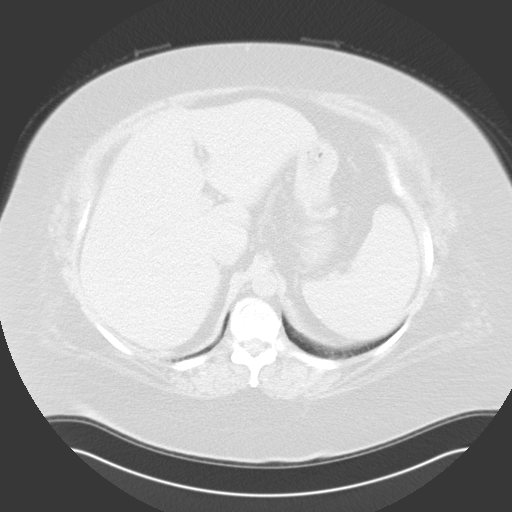
[im 53/60  soft-tissue]
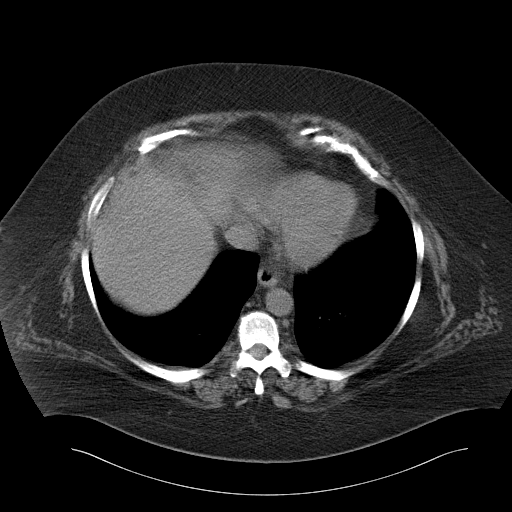
[im 53/60  lung]
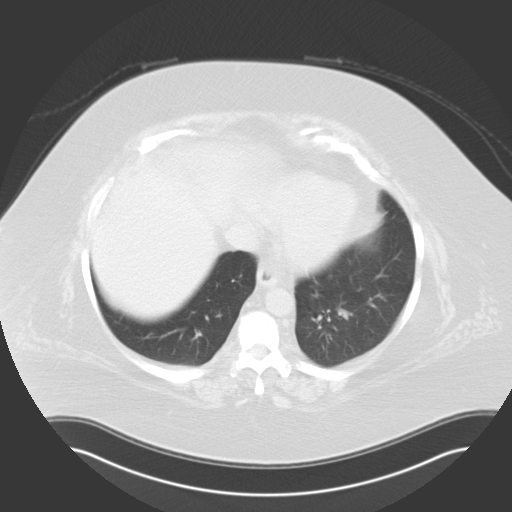

[Series 3: with · axial · 0.90mm/px · z∈[+486,+750]mm · 6 of 60 slices shown]
[im 7/60  soft-tissue]
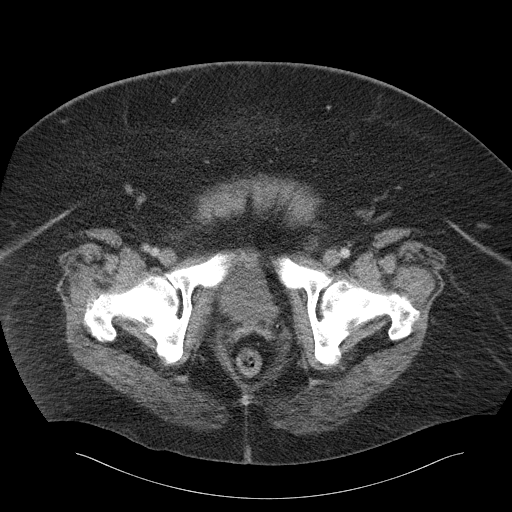
[im 14/60  soft-tissue]
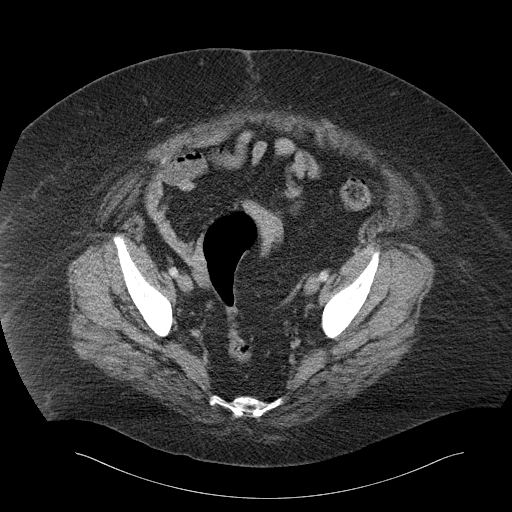
[im 20/60  soft-tissue]
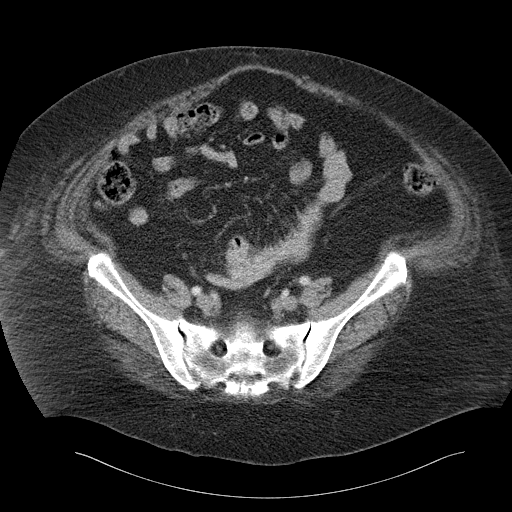
[im 27/60  soft-tissue]
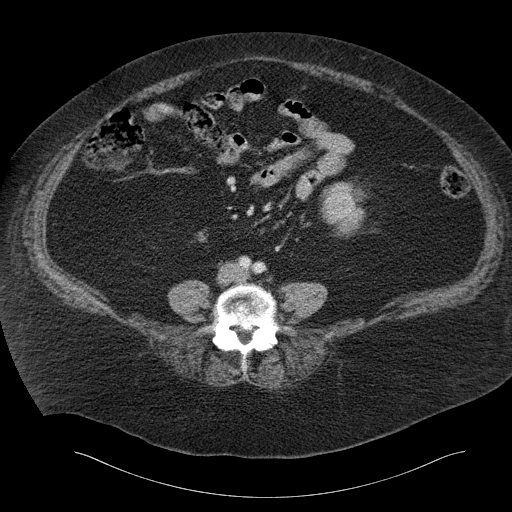
[im 33/60  soft-tissue]
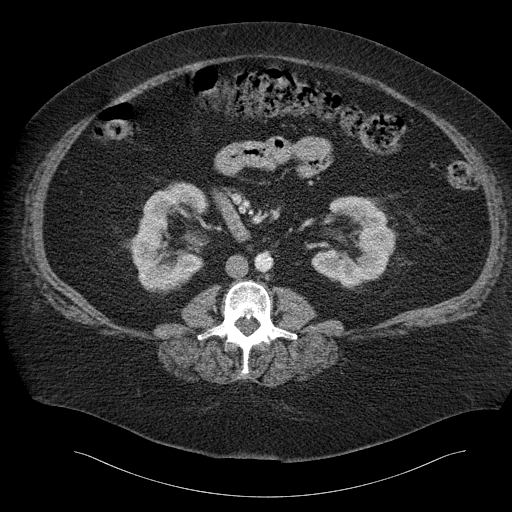
[im 40/60  soft-tissue]
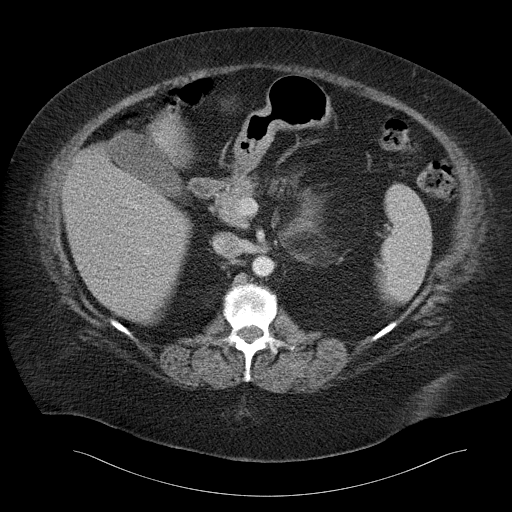

[14 of 32 positions shown; findings below may reference images not displayed]

FINDINGS: On the unenhanced images there is noted an approximately 4.5 mm
calculus in the mid pole of the RIGHT kidney posteriorly as well as an
approximately 3 mm calculus in the RIGHT renal pelvis.  No hydronephrosis is
noted.  There appears there may be some mild distention of the mid pole
calyx posteriorly where the calculus is located.  This calculus was not seen
on the previous study of 08-24-05.  No masses are identified in either kidney.
 Both kidneys excrete the contrast.

There is again noted a lesion in the RIGHT adrenal probably representing a
lipoma appearing unchanged from previous exam. In addition there is an area
near the tail of the pancreas, which reveals some stranding and loculated
appearance of the fat in this region.  It appears similar to prior studies
and may represent post inflammation with scarring.  The possibility of a
lipoma may also be a consideration.  It does appear stable.  Calcification
in the circumflex arteries is identified.
IMPRESSION: 1)4.5 mm calculus in the mid pole of the RIGHT kidney posteriorly with some
mild dilatation of the calyx.  There is also noted a 3 mm calculus in the
RIGHT renal pelvis which is non-obstructing and approximately in the region
seen previously.

2)Stable lesion in the RIGHT adrenal as well as near the tail of the
pancreas as seen on previous exams.

## 2008-03-27 ENCOUNTER — Ambulatory Visit: Payer: Self-pay | Admitting: Internal Medicine

## 2008-05-28 ENCOUNTER — Ambulatory Visit: Payer: Self-pay | Admitting: Anesthesiology

## 2008-06-22 ENCOUNTER — Inpatient Hospital Stay: Payer: Self-pay | Admitting: Internal Medicine

## 2008-07-02 IMAGING — US ABDOMEN ULTRASOUND
1 series · 17 of 25 positions shown · non-contrast
Comparison: none

REASON FOR EXAM: Epigastric Pain
COMMENTS:

[Series 1: abdomen ultrasound · 17 of 51 slices shown]
[im 1/51]
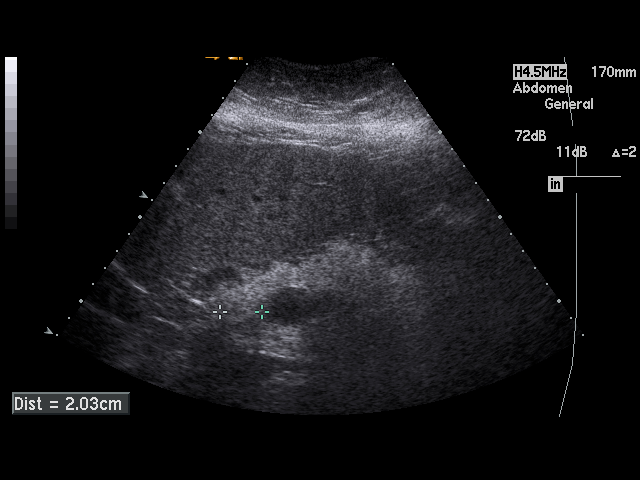
[im 5/51]
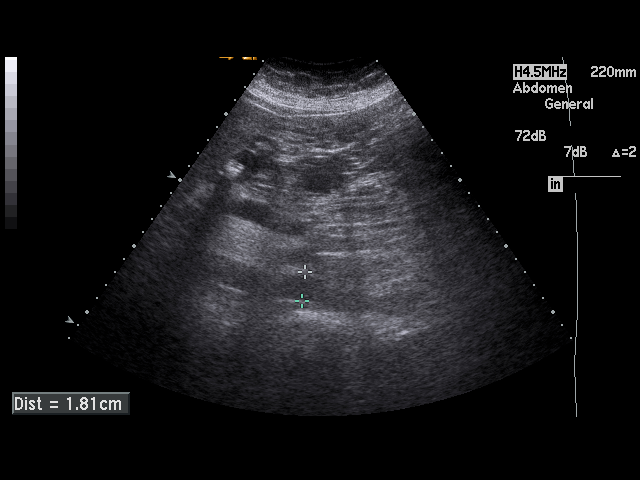
[im 7/51]
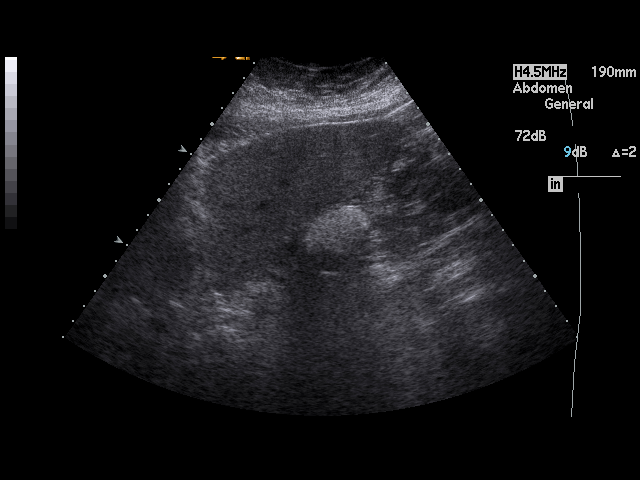
[im 11/51]
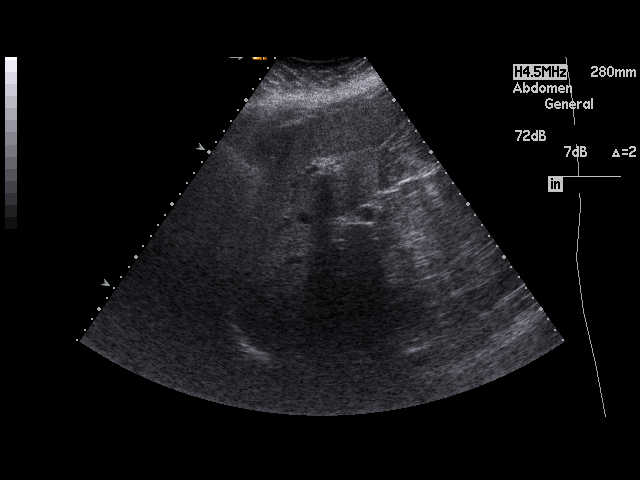
[im 13/51]
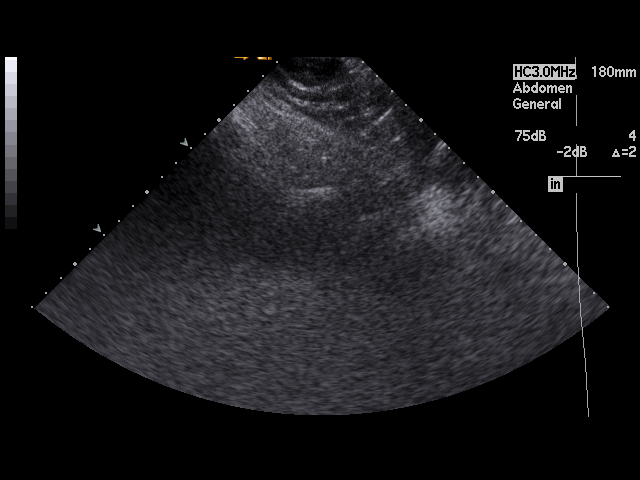
[im 17/51]
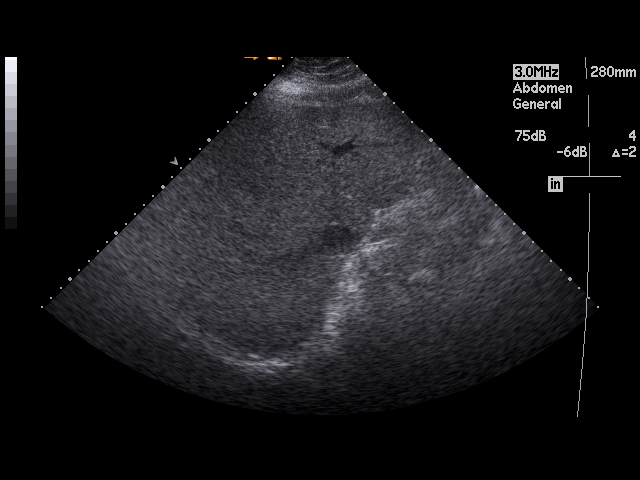
[im 19/51]
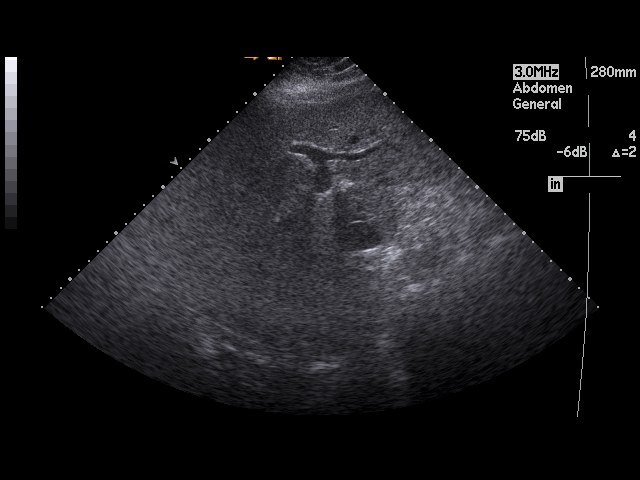
[im 23/51]
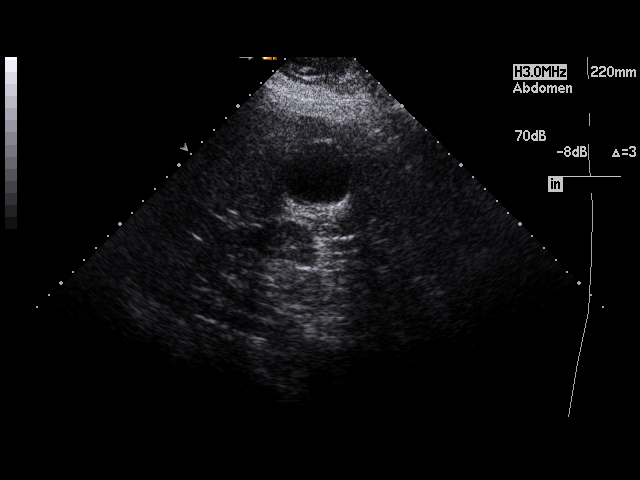
[im 26/51]
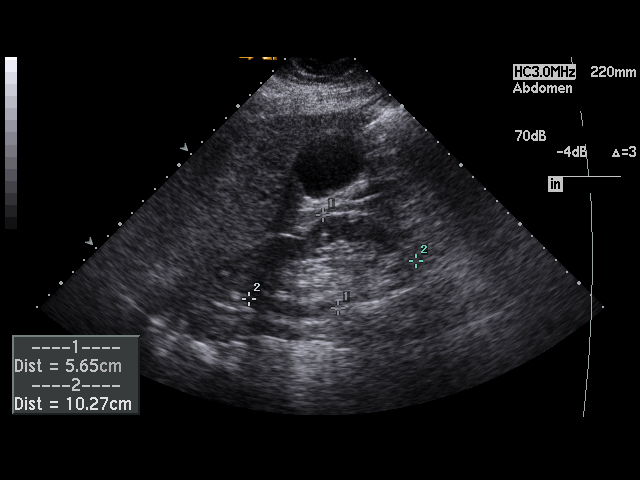
[im 28/51]
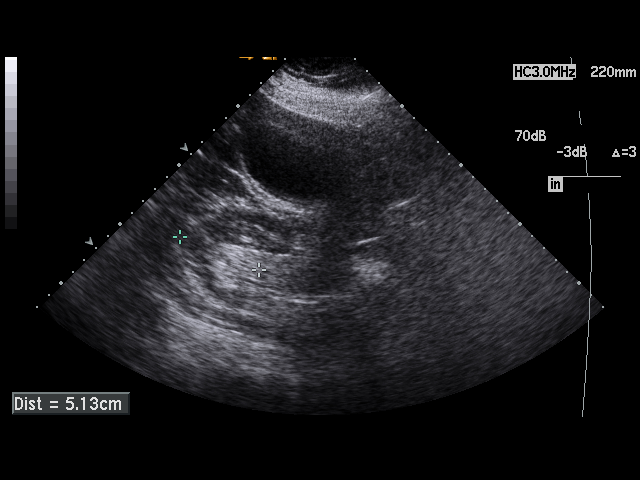
[im 32/51]
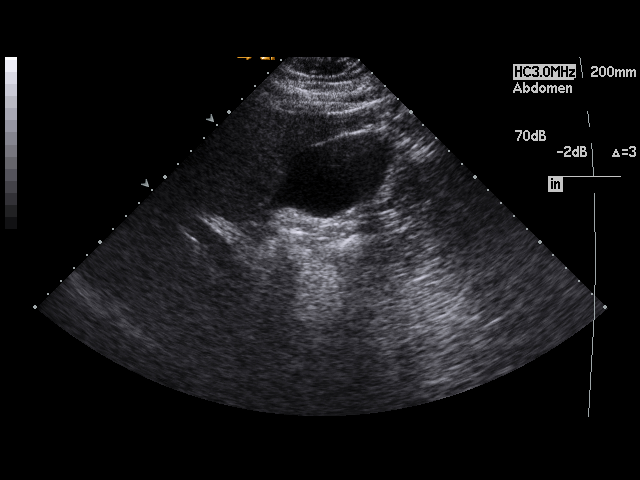
[im 34/51]
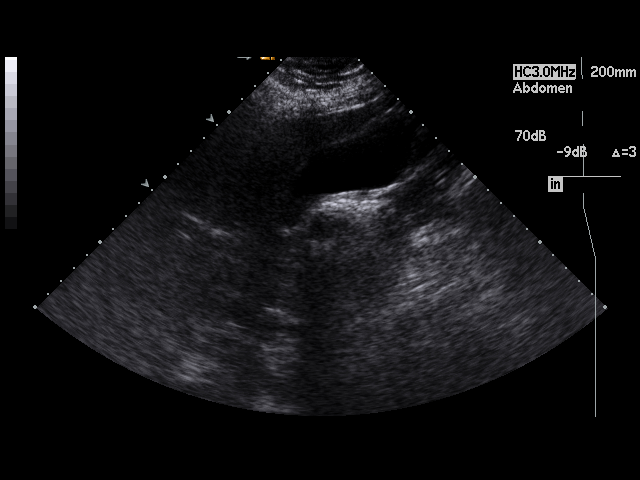
[im 38/51]
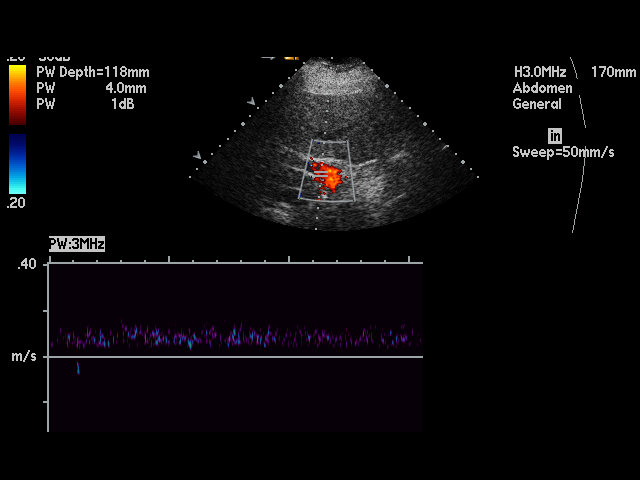
[im 40/51]
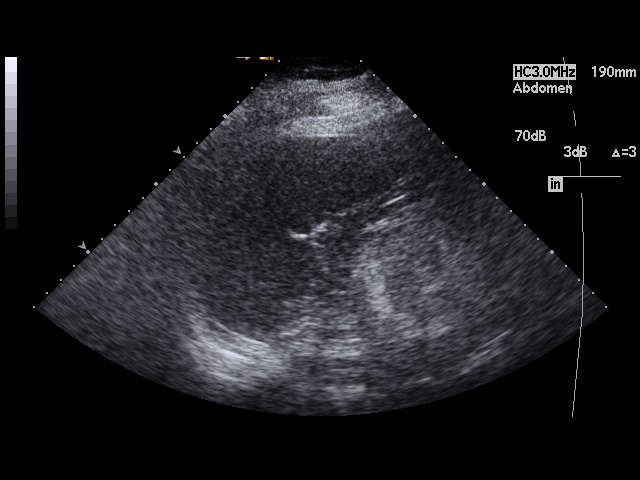
[im 44/51]
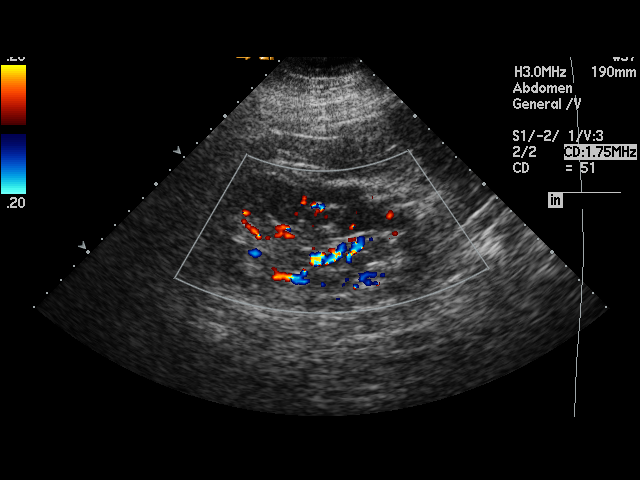
[im 46/51]
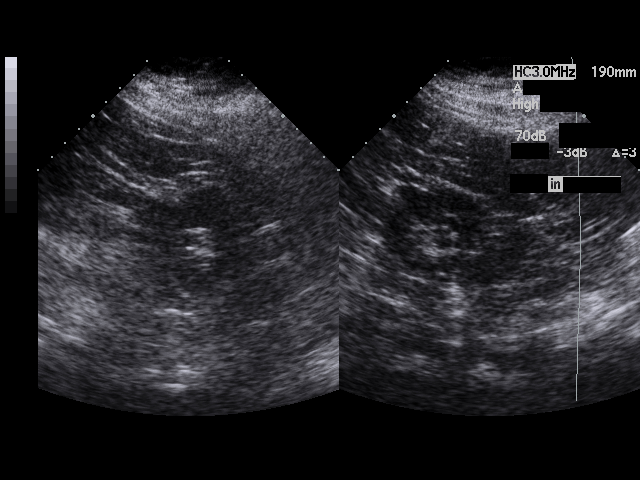
[im 51/51]
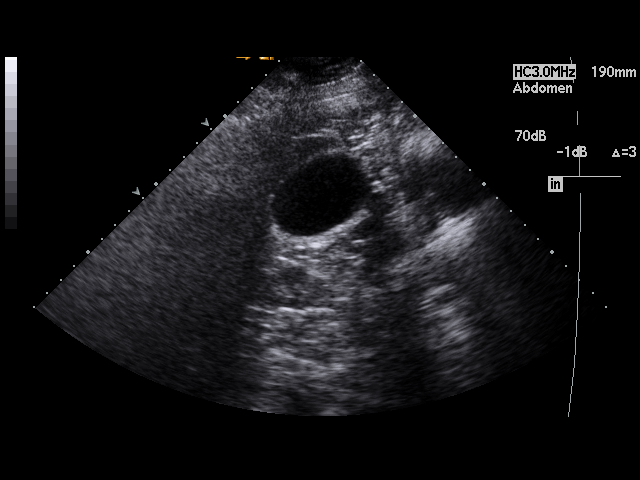

[17 of 25 positions shown; findings below may reference images not displayed]

PROCEDURE:     US  - US ABDOMEN GENERAL SURVEY  - April 15, 2006  [DATE]

RESULT:       The patient is complaining of epigastric discomfort.
The gallbladder is adequately distended with no evidence of stones, wall
thickening or pericholecystic fluid.  The liver demonstrates changes
consistent with fatty infiltration.  Portal venous flow is normal in
direction toward the liver.  The pancreas could only be partially
demonstrated due to the presence of bowel gas.  No pancreatic ductal
dilation is seen.  The spleen is normal in size and echotexture measuring
11.8 cm in length.  Survey views of the abdominal aorta are normal.

The RIGHT kidney measures 10.3 cm in length and the LEFT kidney 12.2 cm in
length.  The patient does have a history of urinary tract stones but
currently there is no evidence of obstruction.   Some cortical thinning of
the RIGHT kidney is noted.  The patient reportedly has undergone multiple
lithotripsies.
IMPRESSION: 1.     The study is somewhat limited due to the patient's body habitus and
bowel gas. I do not see evidence of gallstones or common bile duct dilation.
 The duct measures 3.7 mm in diameter.
2.     The liver demonstrates fatty infiltration.  The observed portions of
the pancreas are normal.

## 2008-07-27 IMAGING — NM NUCLEAR MEDICINE HEPATOHBILIARY INCLUDE GB
2 series · 12 of 12 positions shown · non-contrast
Comparison: none

REASON FOR EXAM: Nausea, vomiting, abdominal pain
COMMENTS:

[Series 1: gallbladder dynamic · 4.80mm/px · 6 of 60 frames shown]
[frame 6/60]
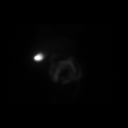
[frame 16/60]
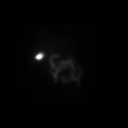
[frame 26/60]
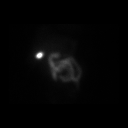
[frame 36/60]
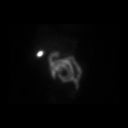
[frame 46/60]
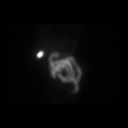
[frame 56/60]
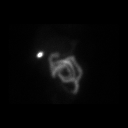

[Series 1: gallbladder dynamic (results) · 4.80mm/px · 6 of 60 frames shown]
[frame 6/60]
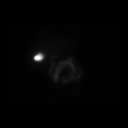
[frame 16/60]
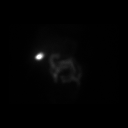
[frame 26/60]
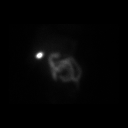
[frame 36/60]
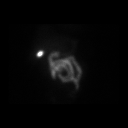
[frame 46/60]
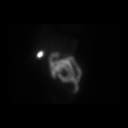
[frame 56/60]
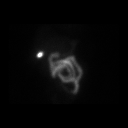

[12 of 12 positions shown; findings below may reference images not displayed]

PROCEDURE:     NM  - NM HEPATO WITH GB EJECT FRACTION  - May 10, 2006 [DATE]

RESULT:     Following intravenous administration of 8.03 mCi of Technetium
99m Choletec, there is noted prompt visualization of tracer activity in the
liver at 3 minutes. Tracer activity is visualized in the gallbladder, common
duct and proximal small bowel at 20 minutes.

The gallbladder ejection fraction at 30 minutes measures 76% which is in the
normal range.
IMPRESSION: 1.     Normal Hepatobiliary Scan.
2.     Normal gallbladder ejection fraction.

## 2008-11-05 ENCOUNTER — Ambulatory Visit: Payer: Self-pay | Admitting: Internal Medicine

## 2008-11-12 ENCOUNTER — Ambulatory Visit: Payer: Self-pay | Admitting: Internal Medicine

## 2008-11-16 ENCOUNTER — Ambulatory Visit: Payer: Self-pay | Admitting: Internal Medicine

## 2008-11-16 ENCOUNTER — Other Ambulatory Visit: Payer: Self-pay | Admitting: Internal Medicine

## 2008-11-23 ENCOUNTER — Ambulatory Visit: Payer: Self-pay | Admitting: Internal Medicine

## 2008-11-30 ENCOUNTER — Ambulatory Visit: Payer: Self-pay | Admitting: Internal Medicine

## 2008-12-07 ENCOUNTER — Ambulatory Visit: Payer: Self-pay | Admitting: Internal Medicine

## 2008-12-14 ENCOUNTER — Ambulatory Visit: Payer: Self-pay | Admitting: Internal Medicine

## 2008-12-18 ENCOUNTER — Ambulatory Visit: Payer: Self-pay | Admitting: Internal Medicine

## 2008-12-25 ENCOUNTER — Ambulatory Visit: Payer: Self-pay | Admitting: Internal Medicine

## 2008-12-28 ENCOUNTER — Ambulatory Visit: Payer: Self-pay | Admitting: Podiatrist

## 2009-01-25 IMAGING — CT CT ABDOMEN AND PELVIS WITHOUT AND WITH CONTRAST
2 of 6 series · 13 of 32 positions shown, 19 images · non-contrast
Comparison: none

REASON FOR EXAM: Kidney stone
        Call report to:  [phone]
COMMENTS:

[Series 2: stone · axial · 0.98mm/px · z∈[-175,+209]mm · 9 of 160 slices shown, 15 images]
[im 16/160  soft-tissue]
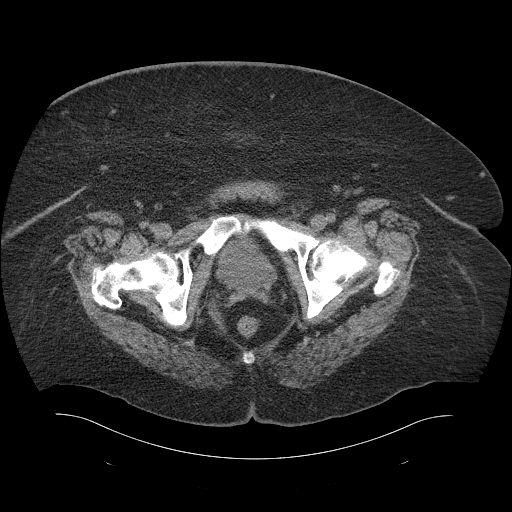
[im 16/160  bone]
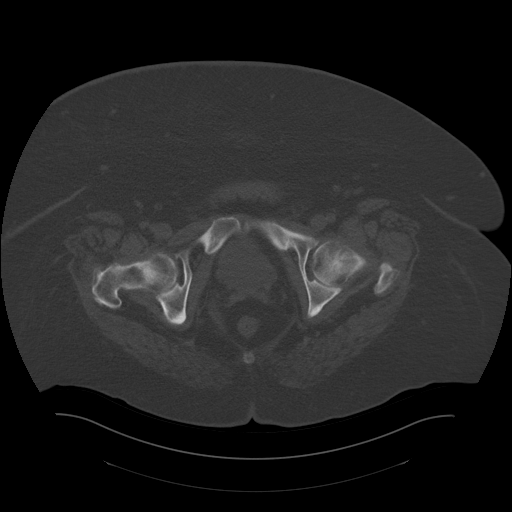
[im 32/160  soft-tissue]
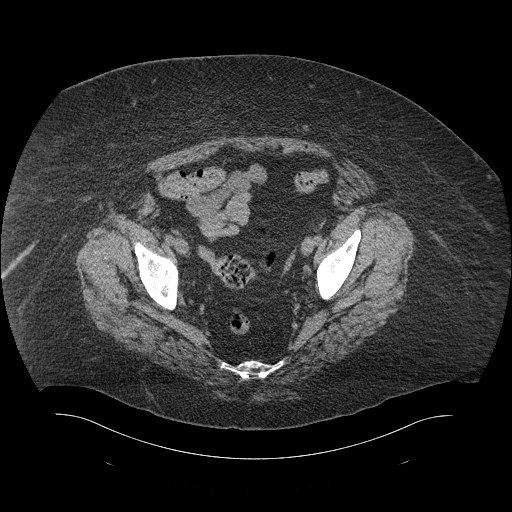
[im 48/160  soft-tissue]
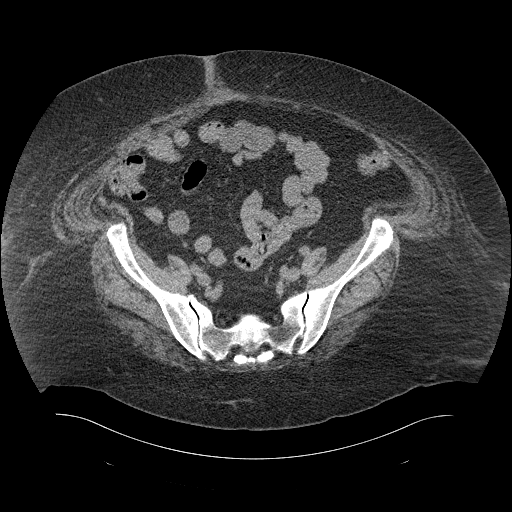
[im 64/160  soft-tissue]
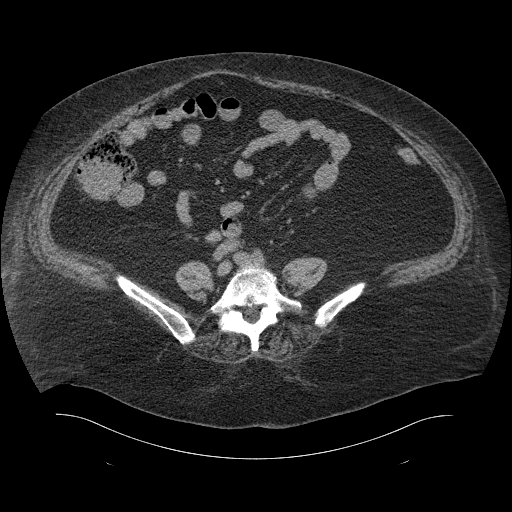
[im 80/160  soft-tissue]
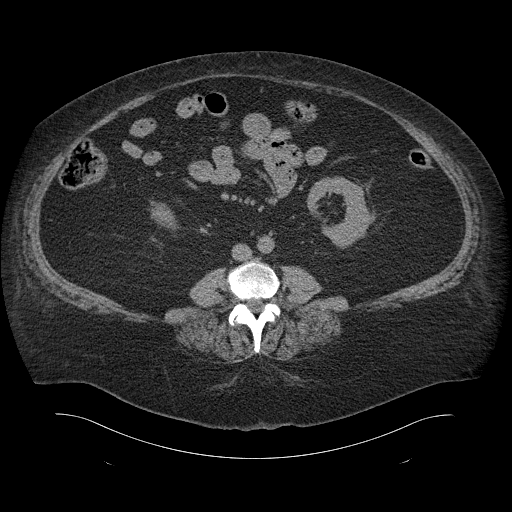
[im 96/160  soft-tissue]
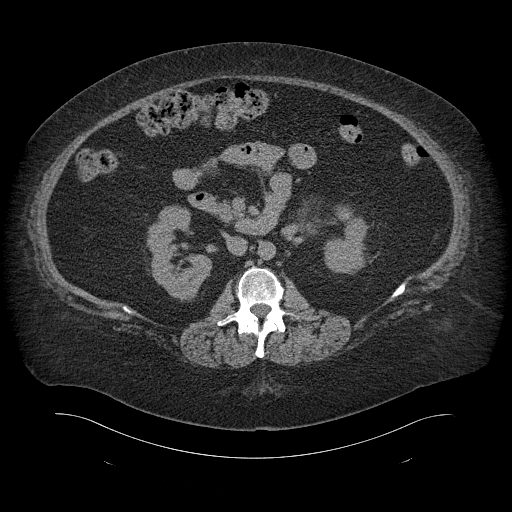
[im 96/160  lung]
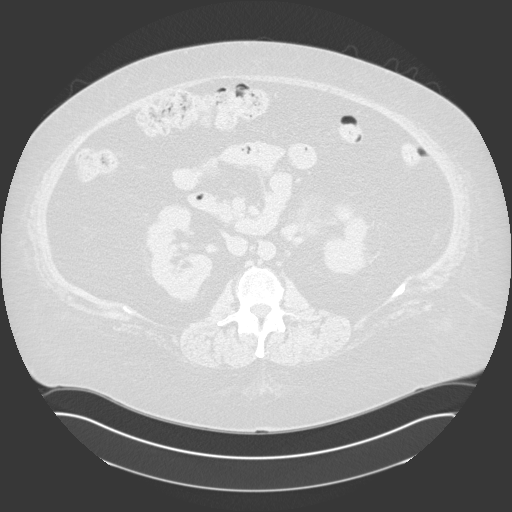
[im 112/160  soft-tissue]
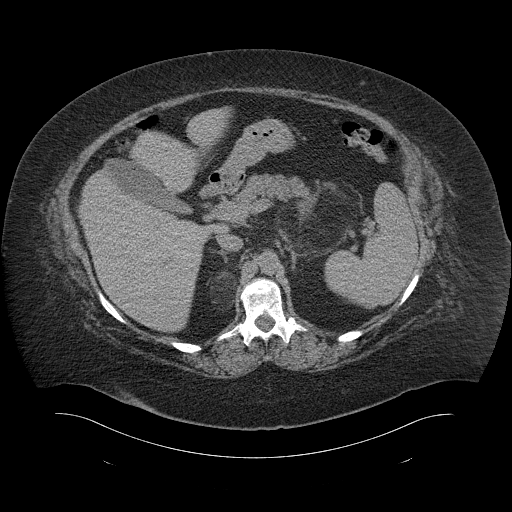
[im 112/160  lung]
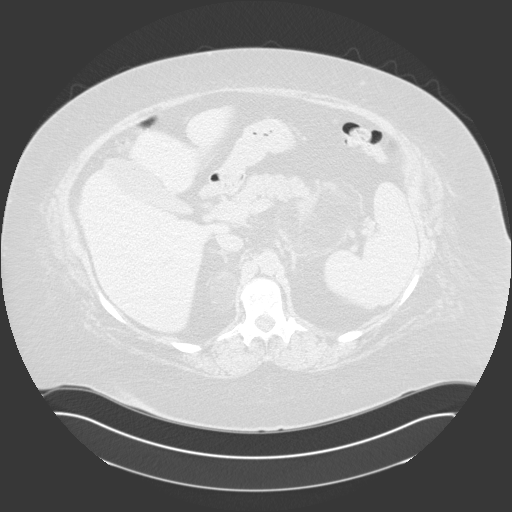
[im 128/160  soft-tissue]
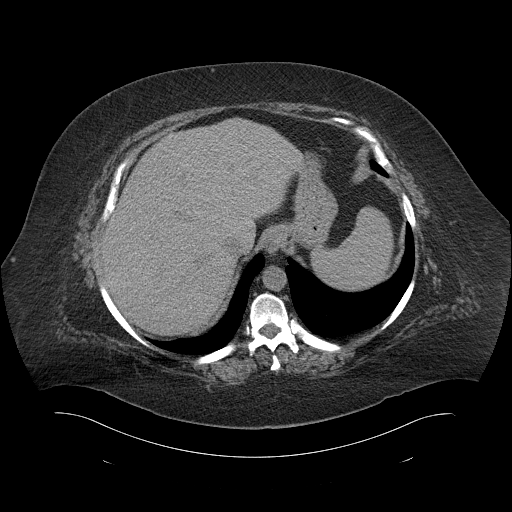
[im 128/160  lung]
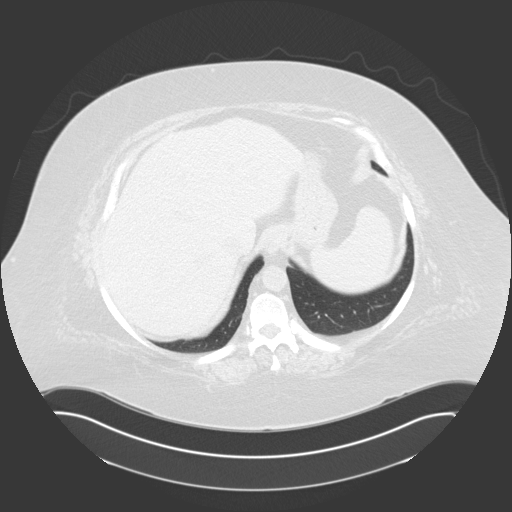
[im 144/160  soft-tissue]
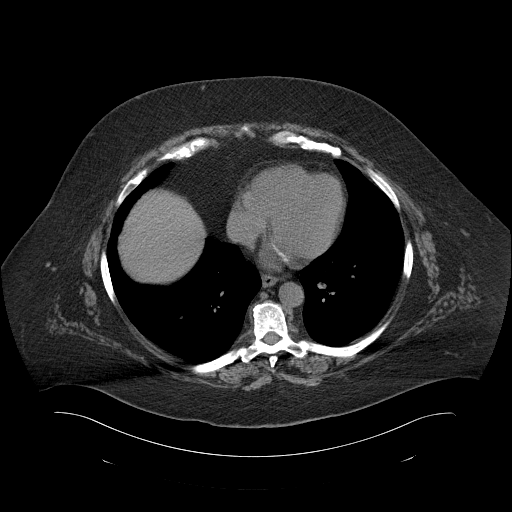
[im 144/160  lung]
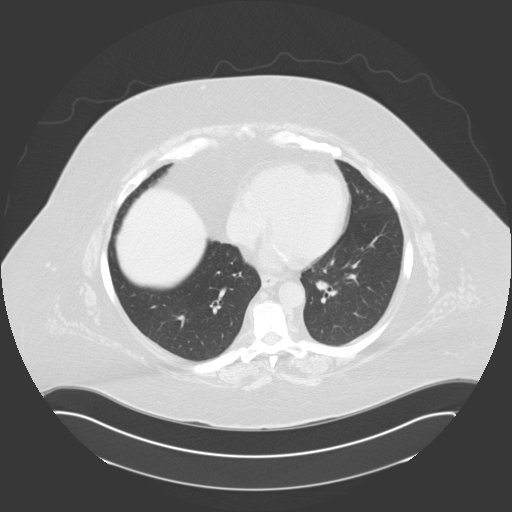
[im 144/160  bone]
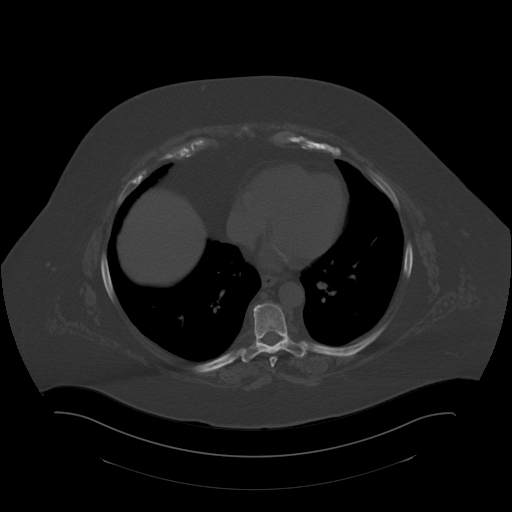

[Series 8: with · axial · 0.98mm/px · z∈[-186,+64]mm · 4 of 101 slices shown]
[im 17/101  soft-tissue]
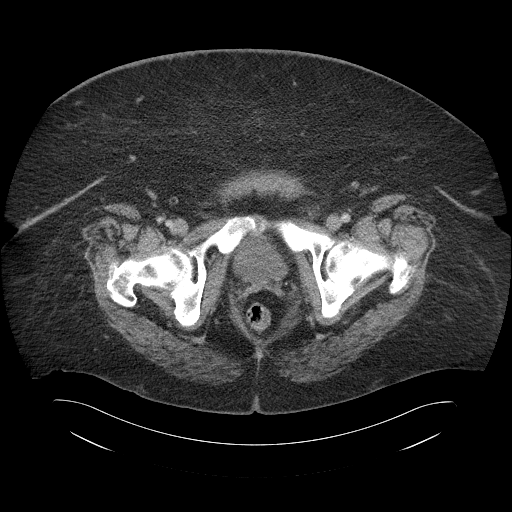
[im 34/101  soft-tissue]
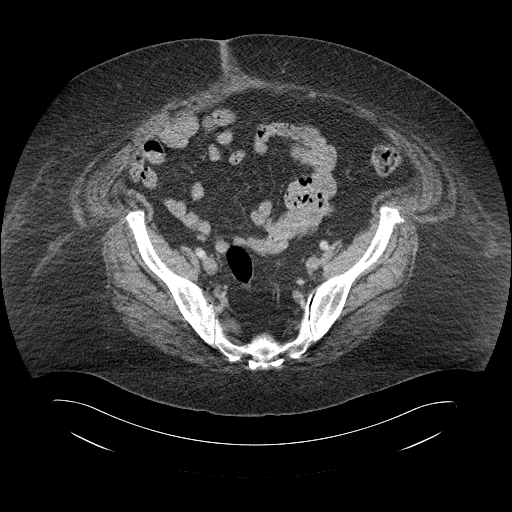
[im 51/101  soft-tissue]
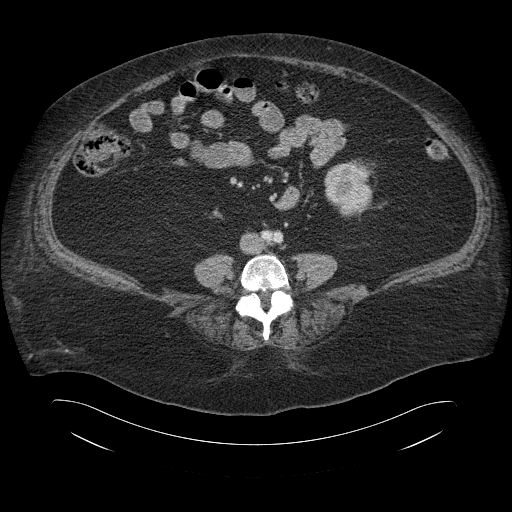
[im 67/101  soft-tissue]
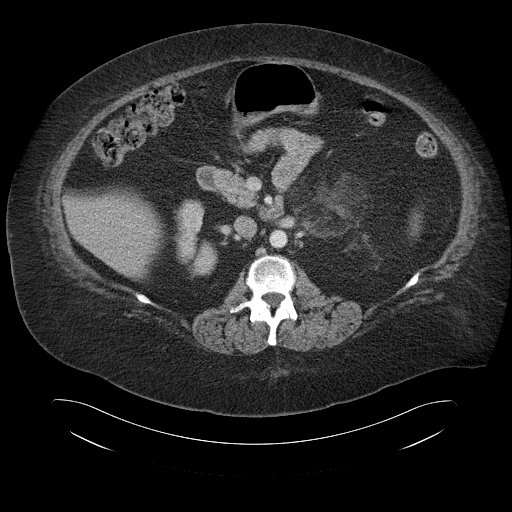

[13 of 32 positions shown; findings below may reference images not displayed]

PROCEDURE:     CT  - CT ABDOMEN / PELVIS  W/WO  - November 08, 2006  [DATE]

RESULT:     Helical, 3.0 mm sections as well as post contrasted and delayed
5.0 mm sections were obtained from the lung bases through the pubic
symphysis. The 3.0 mm sections were performed pre intravenous administration
of contrast. The patient received 100 ml of Psovue-DQE.

Comparison is made to a previous study dated 12/14/2005.

Evaluation of the lung bases demonstrates no gross abnormalities.

Evaluation of the RIGHT kidney demonstrates a 1.01 cm calculus within the
renal pelvis. On the previous study, a calculus was identified in this
region, smaller and measuring approximately 4.0 mm. There is no evidence of
hydronephrosis. No evidence of RIGHT or LEFT renal masses is identified.
Evaluation of the liver, spleen and pancreas is unremarkable. A primarily
fat containing mass is appreciated involving the RIGHT adrenal, stable when
compared to the previous study, and likely representing an angiomyolipoma. A
second, fat containing mass projects within the anterior retroperitoneal
region on the LEFT and again stable when compared to the previous study. No
new masses, free fluid or drainable loculated fluid collections are
identified. Evaluation of the pelvis demonstrates no evidence of free fluid
or drainable, loculated fluid collections, masses or adenopathy.
IMPRESSION: 1.     Non-obstructing calculus involving the RIGHT kidney.
2.     Stable, primarily fat containing masses within the retroperitoneal
region on the LEFT and involving the RIGHT adrenal. Note, these areas of fat
deposition may be secondary to the patient's body habitus. The patient is
morbidly obese.

## 2009-02-25 ENCOUNTER — Ambulatory Visit: Payer: Self-pay | Admitting: Anesthesiology

## 2009-03-28 ENCOUNTER — Ambulatory Visit: Payer: Self-pay | Admitting: Anesthesiology

## 2009-04-02 IMAGING — US ABDOMEN ULTRASOUND
1 series · 17 of 25 positions shown · non-contrast
Comparison: none

REASON FOR EXAM: RUQ pain
COMMENTS:

[Series 1: abdomen ultrasound · 17 of 57 slices shown]
[im 1/57]
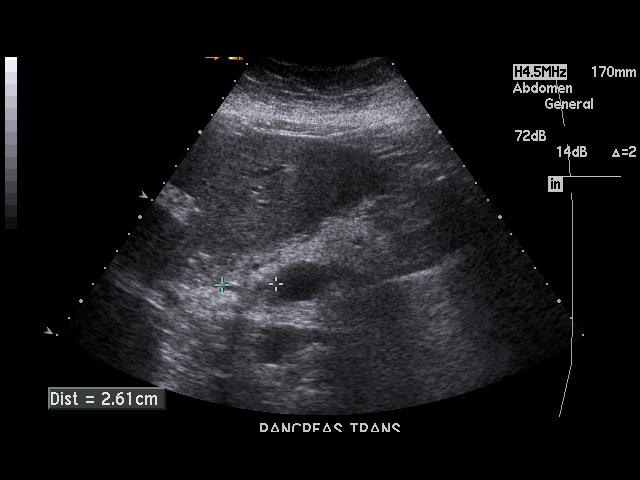
[im 5/57]
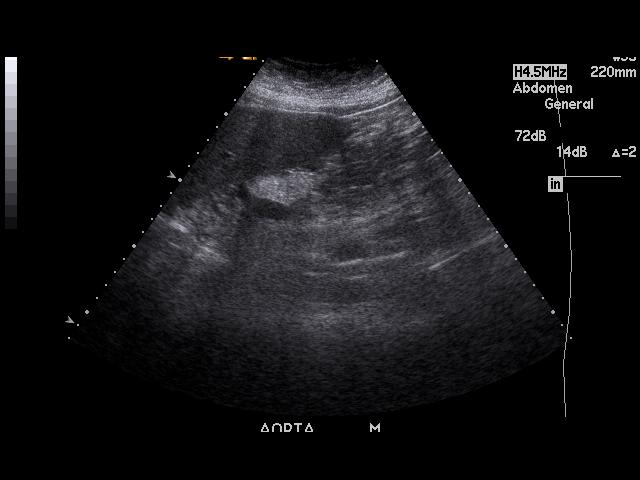
[im 8/57]
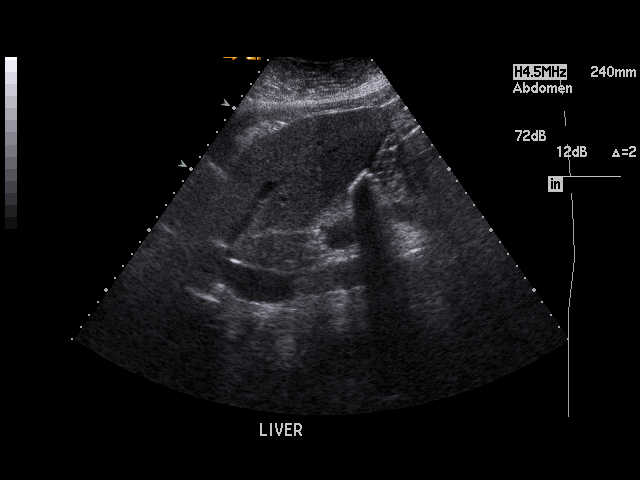
[im 12/57]
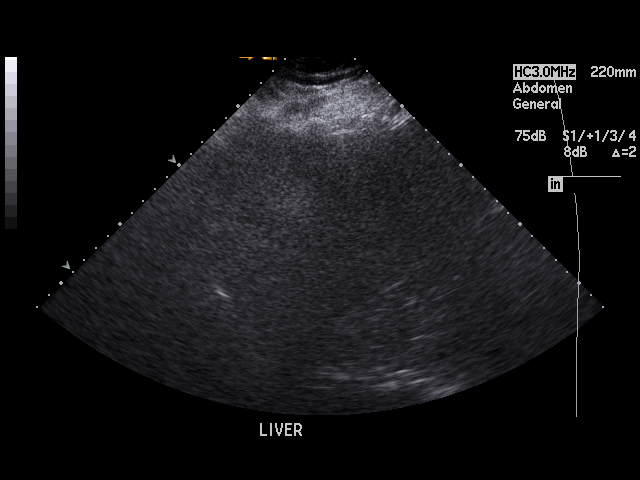
[im 15/57]
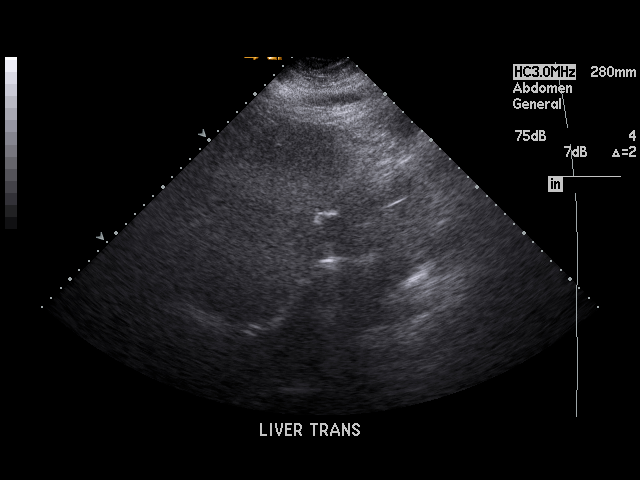
[im 19/57]
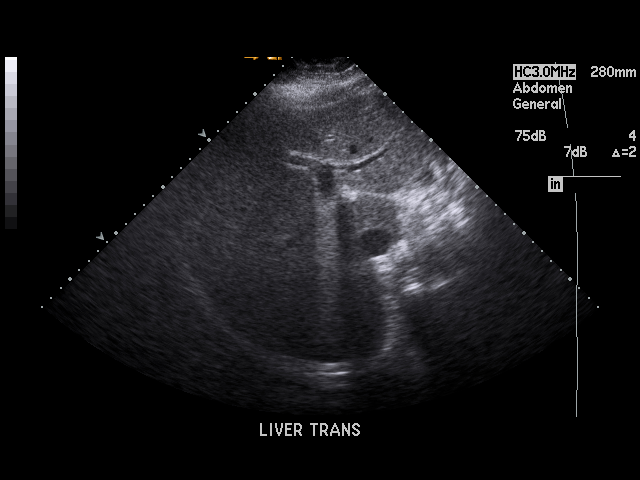
[im 22/57]
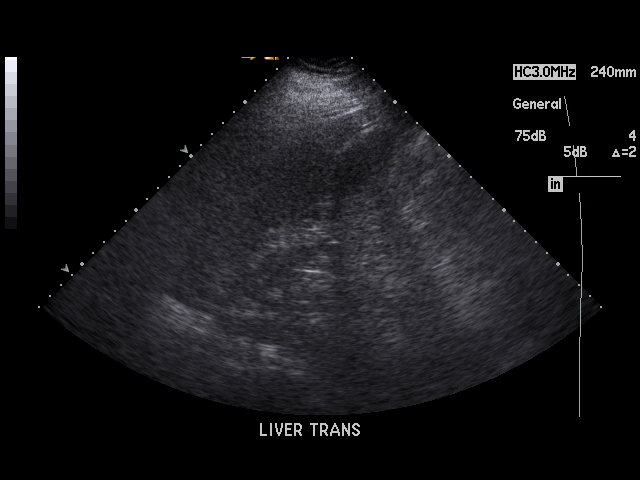
[im 26/57]
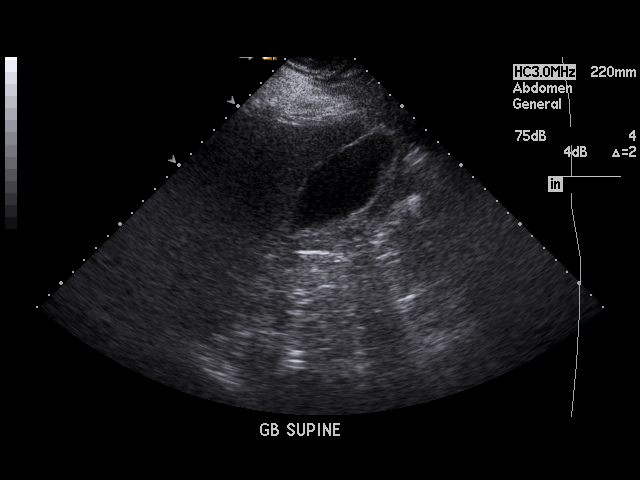
[im 29/57]
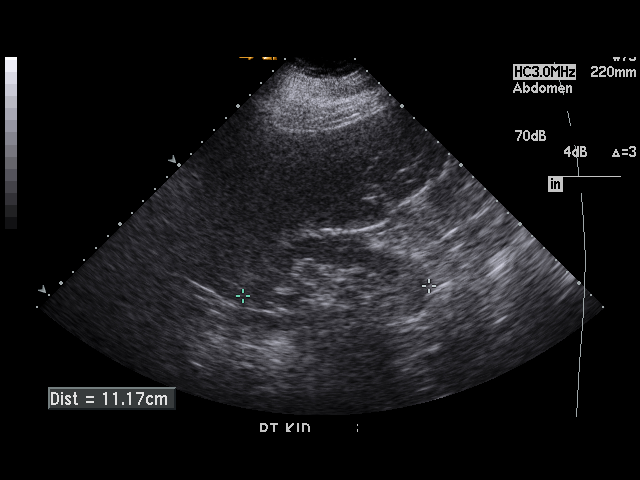
[im 31/57]
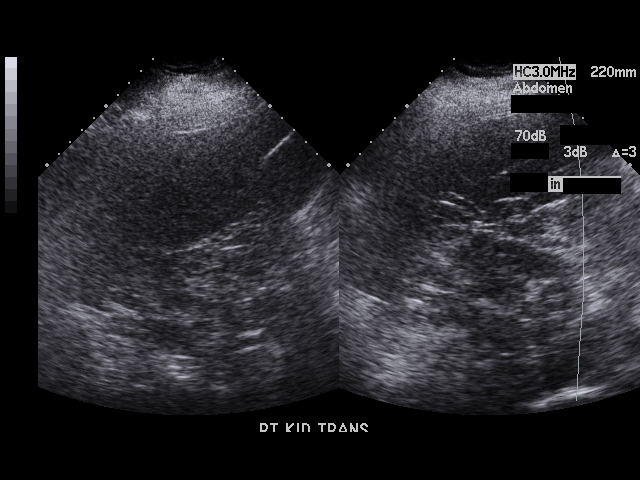
[im 36/57]
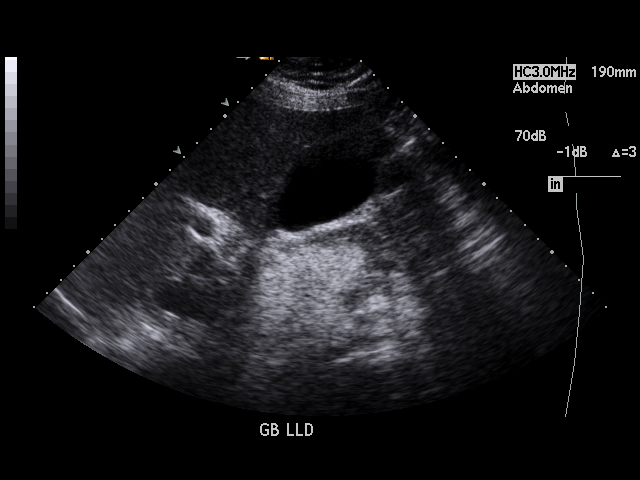
[im 38/57]
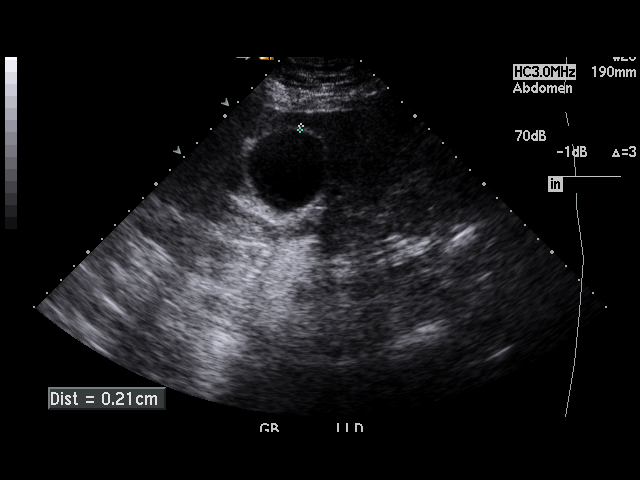
[im 43/57]
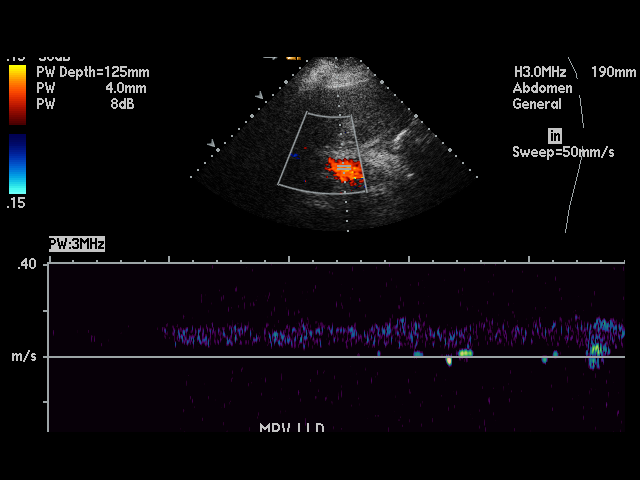
[im 45/57]
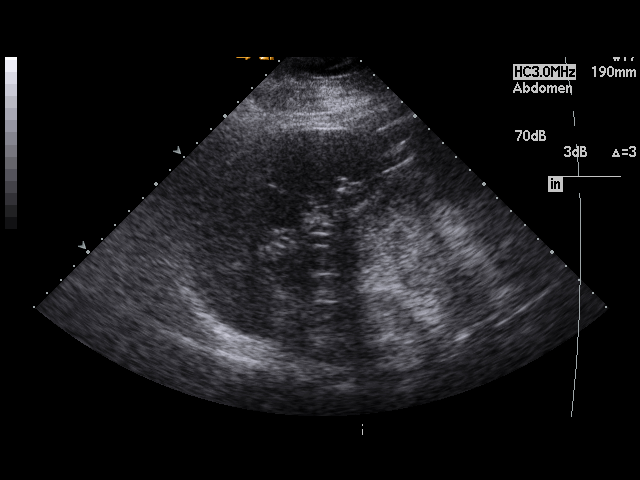
[im 50/57]
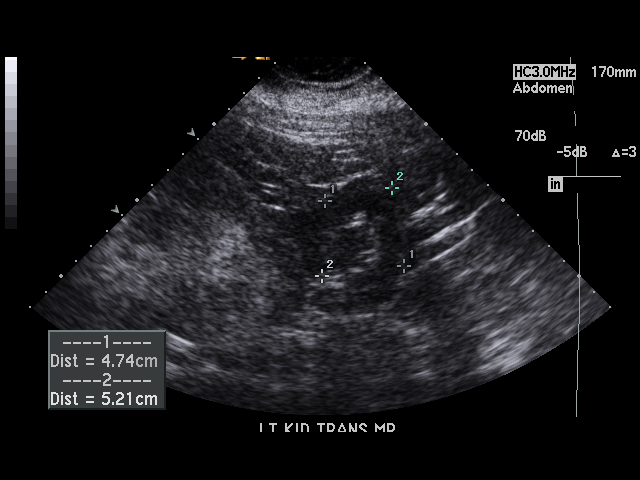
[im 52/57]
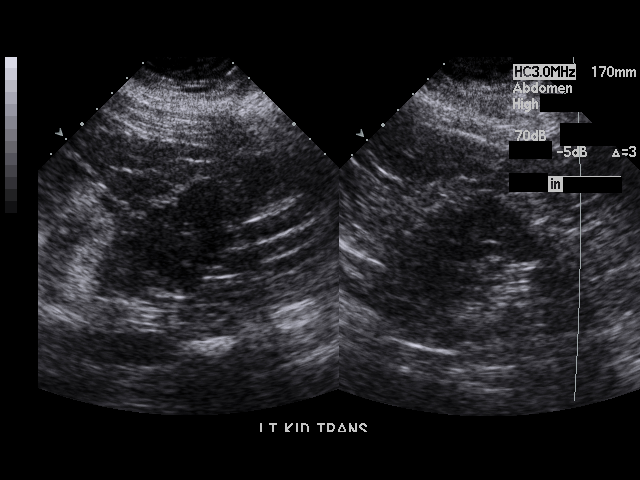
[im 57/57]
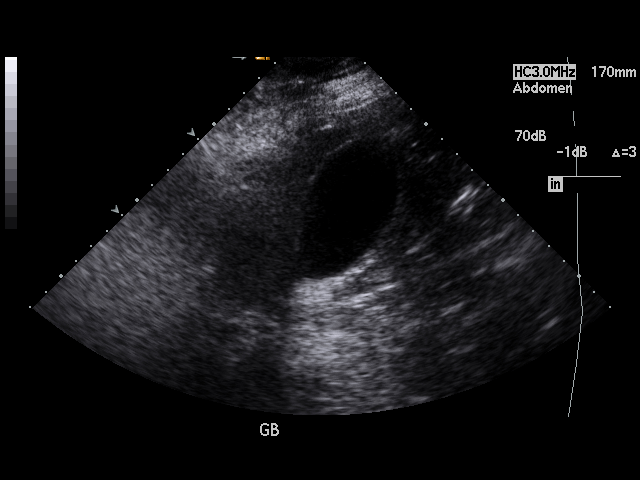

[17 of 25 positions shown; findings below may reference images not displayed]

PROCEDURE:     US  - US ABDOMEN GENERAL SURVEY  - January 14, 2007  [DATE]

RESULT:     The liver exhibits relatively homogeneous echo pattern
suggesting fatty infiltration. No mass or ductal dilation is seen. Portal
venous flow is normal in direction toward the liver. Limited evaluation of
the pancreas was obtained due to bowel gas. The gallbladder is adequately
distended with no evidence of stones, wall thickening, or pericholecystic
fluid. The common bile duct is normal at 2.1 mm in diameter. The spleen,
abdominal aorta, and kidneys are normal in appearance. There is no evidence
of ascites.
IMPRESSION: 1. I see no evidence of gallstones. There is fatty infiltration of the
liver.
2. I see no acute abnormality elsewhere within the abdomen.

## 2009-04-09 ENCOUNTER — Ambulatory Visit: Payer: Self-pay | Admitting: Internal Medicine

## 2009-07-29 ENCOUNTER — Ambulatory Visit: Payer: Self-pay | Admitting: Anesthesiology

## 2009-09-05 ENCOUNTER — Ambulatory Visit: Payer: Self-pay | Admitting: Anesthesiology

## 2009-10-18 ENCOUNTER — Encounter: Payer: Self-pay | Admitting: Orthopedic Surgery

## 2009-10-21 ENCOUNTER — Encounter: Payer: Self-pay | Admitting: Orthopedic Surgery

## 2009-11-21 ENCOUNTER — Encounter: Payer: Self-pay | Admitting: Orthopedic Surgery

## 2009-12-21 ENCOUNTER — Encounter: Payer: Self-pay | Admitting: Orthopedic Surgery

## 2010-02-21 ENCOUNTER — Ambulatory Visit: Payer: Self-pay | Admitting: Anesthesiology

## 2010-06-05 ENCOUNTER — Ambulatory Visit: Payer: Self-pay | Admitting: Internal Medicine

## 2010-06-14 IMAGING — MG MM CAD SCREENING MAMMO
1 series · 8 of 8 positions shown · non-contrast
Comparison: none

REASON FOR EXAM: scr mammo
COMMENTS:

[R CC · right · 8 of 8 slices shown]
[im 1/8]
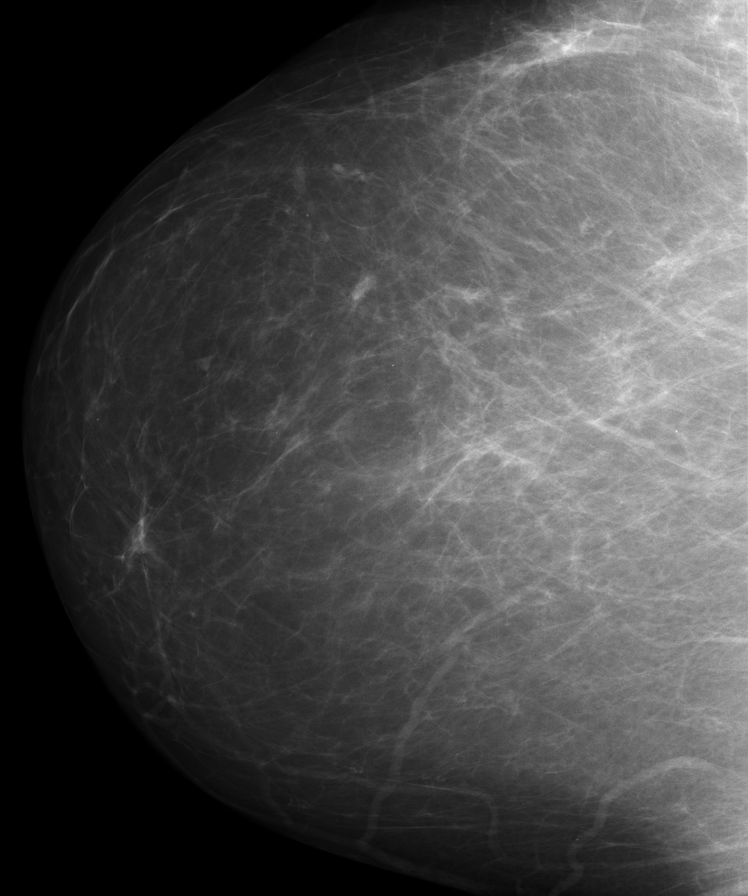
[im 2/8]
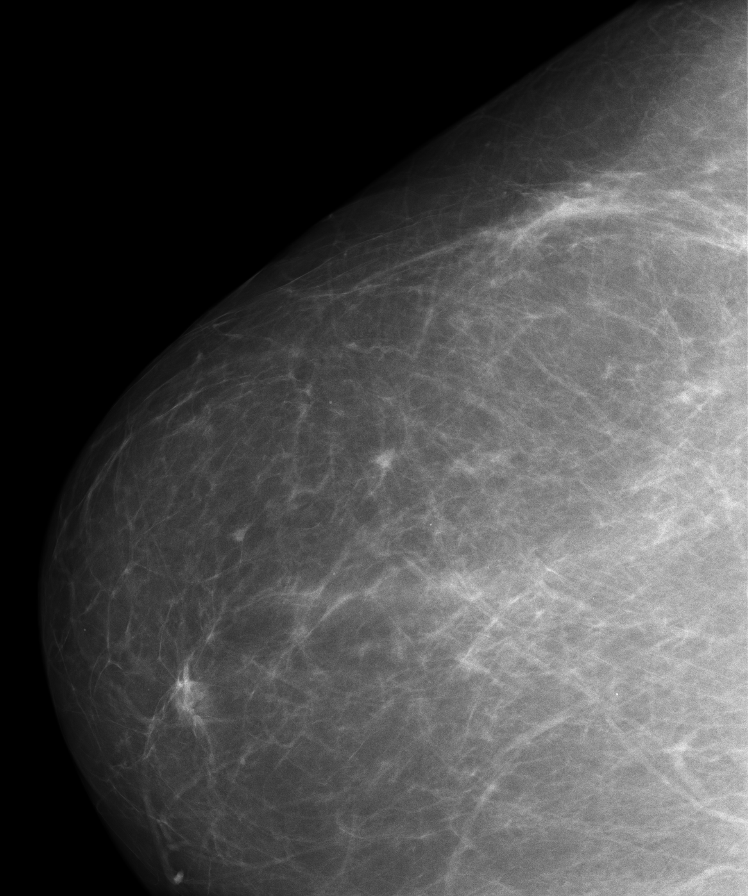
[im 3/8]
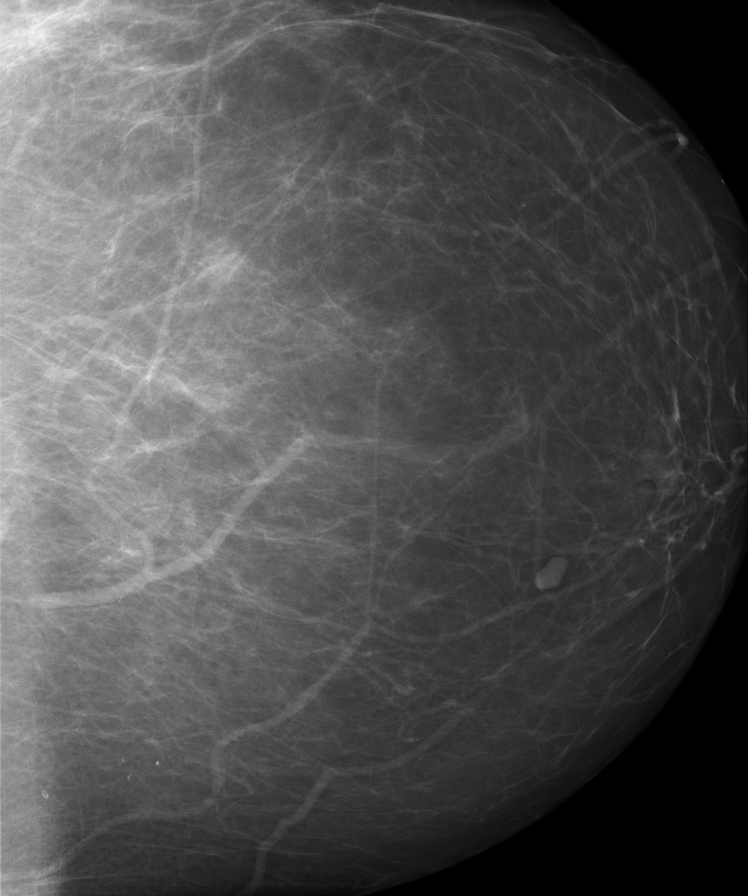
[im 4/8]
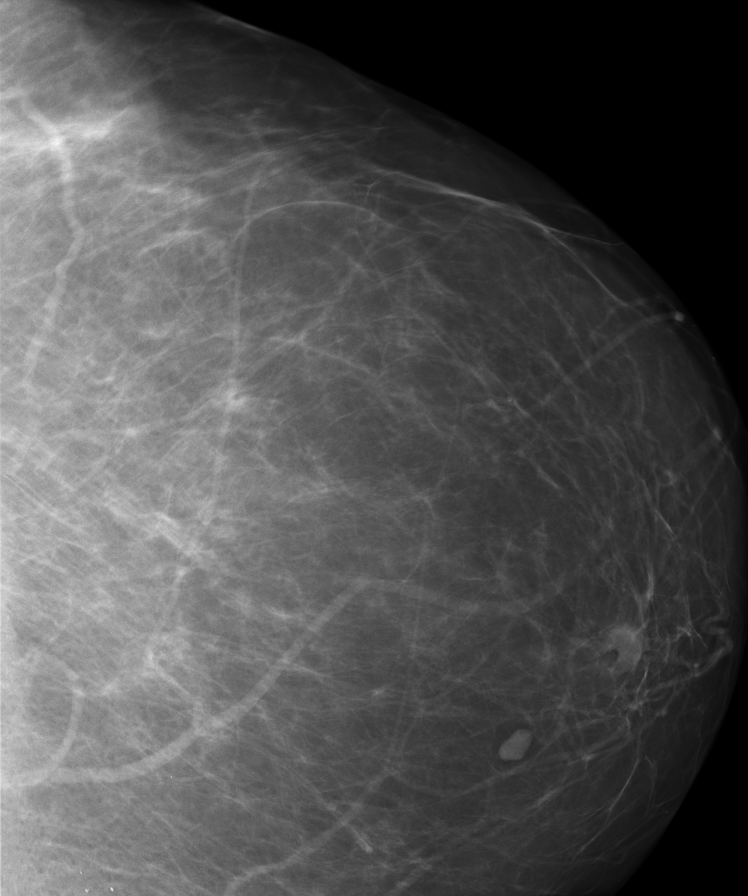
[im 5/8]
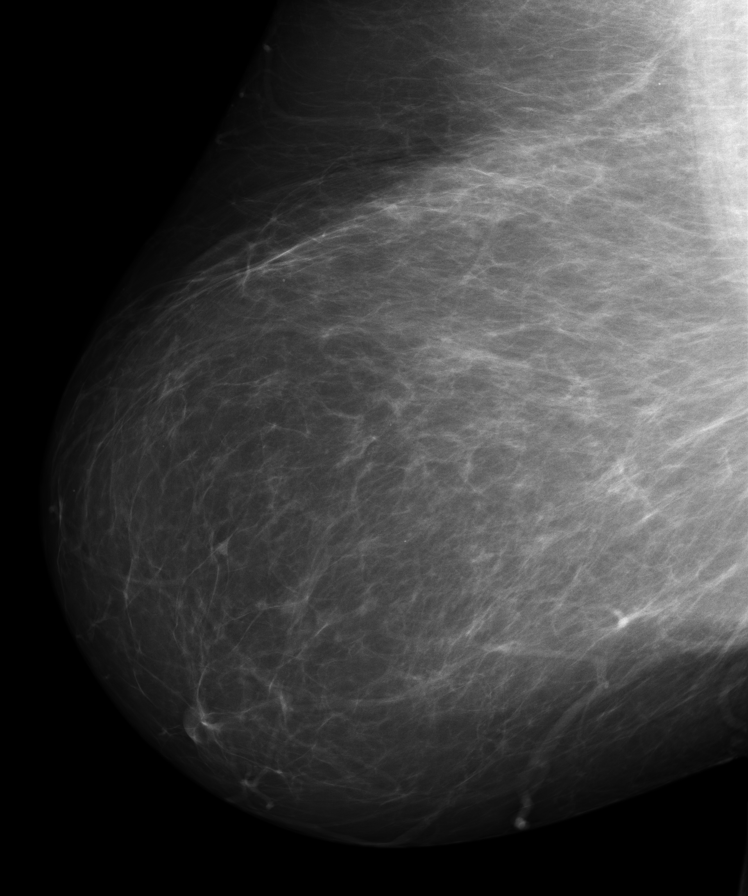
[im 6/8]
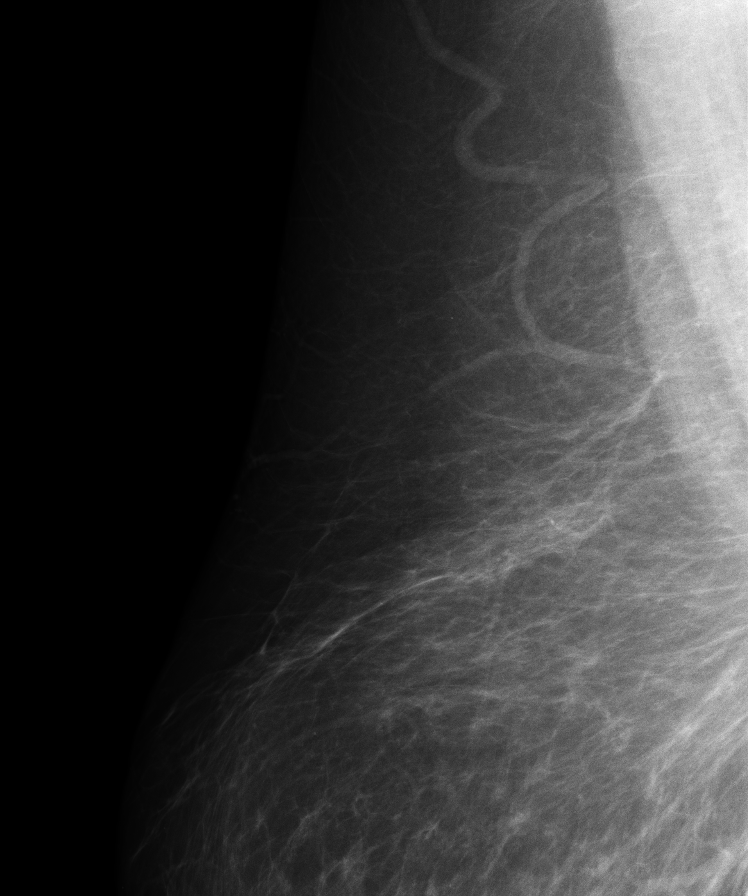
[im 7/8]
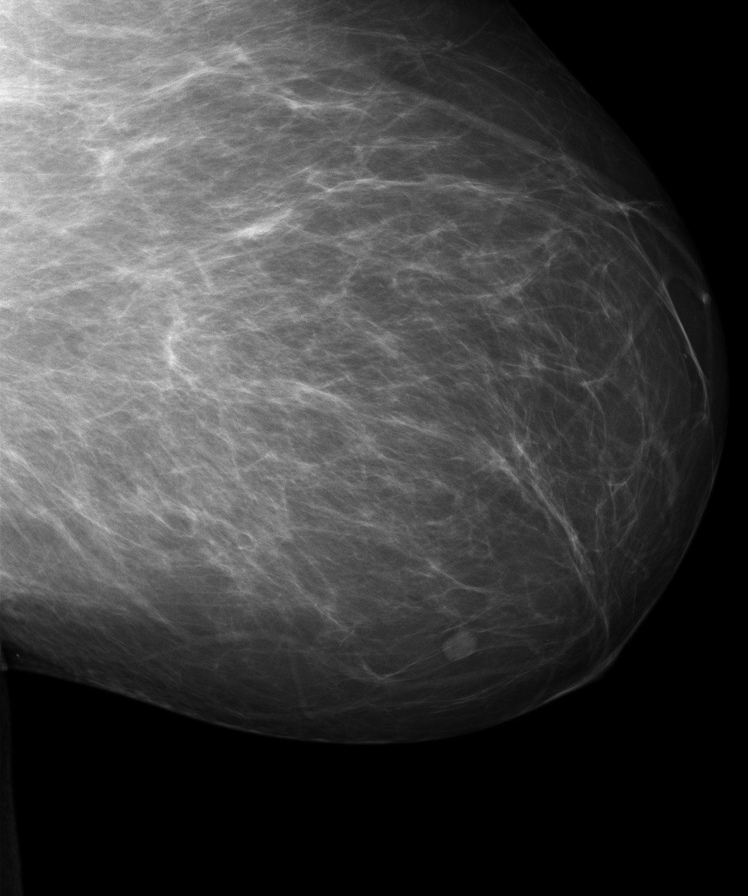
[im 8/8]
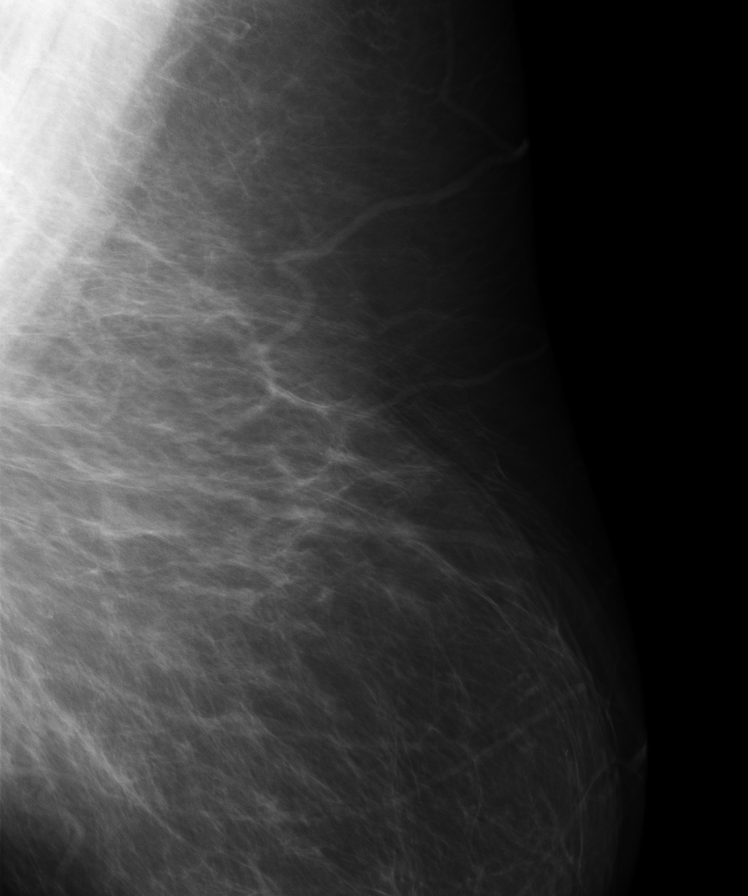

[8 of 8 positions shown; findings below may reference images not displayed]

PROCEDURE:     MAM - MAM DGTL SCREENING MAMMO W/CAD  - March 27, 2008 [DATE]

RESULT:       Film screen images are utilized for comparison dated 04/15/04
as well as 02/09/06.  Nodular density along the inferior medial aspect of
the LEFT breast anteriorly is unchanged. The breasts exhibit a mildly dense
parenchymal pattern.  There is no developing density, dominant mass or
malignant-appearing calcification.
IMPRESSION: 1.     Stable, benign-appearing bilateral mammogram.
2.     BI-RADS:  Category 2-Benign Finding.
3.     Please continue to encourage annual mammographic followup.

A NEGATIVE MAMMOGRAM REPORT DOES NOT PRECLUDE BIOPSY OR OTHER EVALUATION OF
A CLINICALLY PALPABLE OR OTHERWISE SUSPICIOUS MASS OR LESION.  BREAST CANCER
MAY NOT BE DETECTED BY MAMMOGRAPHY IN UP TO 10% OF CASES.

## 2010-07-02 ENCOUNTER — Ambulatory Visit: Payer: Self-pay | Admitting: Anesthesiology

## 2010-08-13 ENCOUNTER — Ambulatory Visit: Payer: Self-pay | Admitting: Urology

## 2010-08-26 ENCOUNTER — Encounter: Payer: Self-pay | Admitting: Nurse Practitioner

## 2010-08-28 ENCOUNTER — Ambulatory Visit: Payer: Self-pay | Admitting: Urology

## 2010-09-01 ENCOUNTER — Ambulatory Visit: Payer: Self-pay | Admitting: Gastroenterology

## 2010-09-03 ENCOUNTER — Ambulatory Visit: Payer: Self-pay | Admitting: Anesthesiology

## 2010-09-09 IMAGING — US US EXTREM LOW VENOUS*L*
1 series · 17 of 23 positions shown · non-contrast
Comparison: none

REASON FOR EXAM: left leg pain/swelling, h/o dvt
COMMENTS:

[Series 1: us extrem low venous*left* · 17 of 23 slices shown]
[im 1/23]
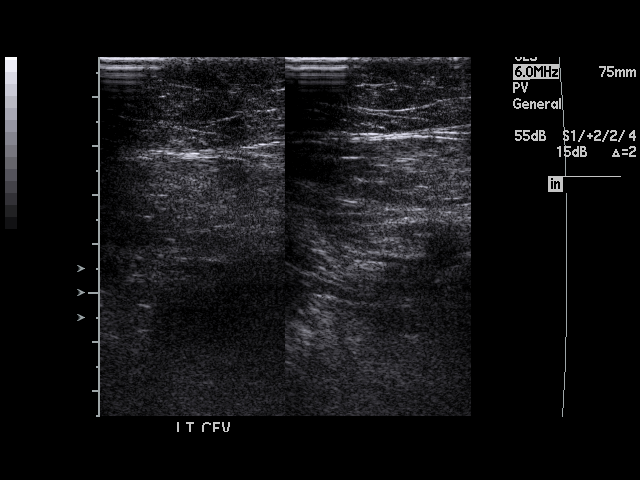
[im 3/23]
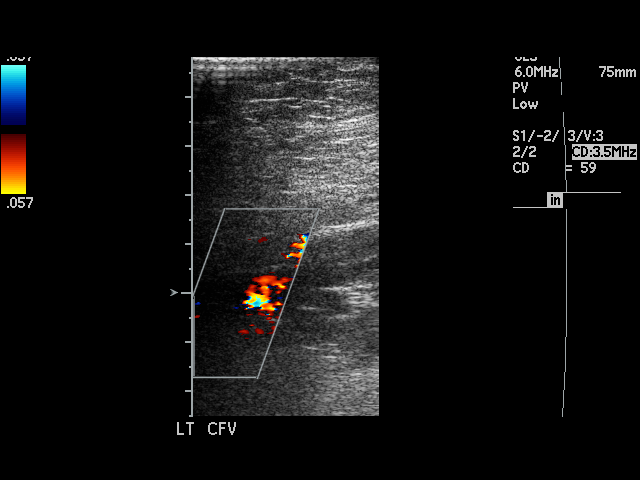
[im 4/23]
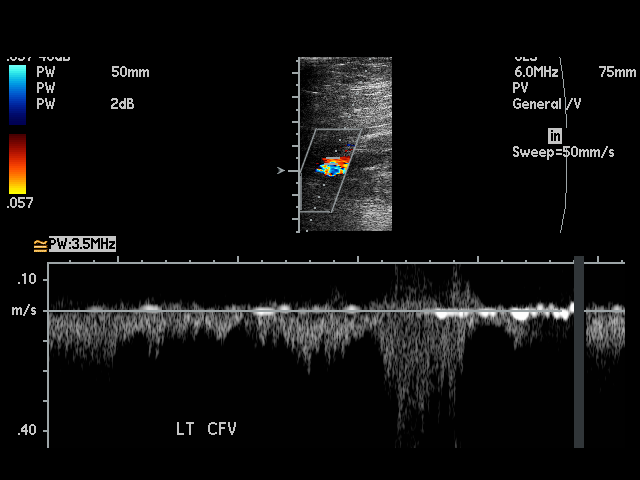
[im 5/23]
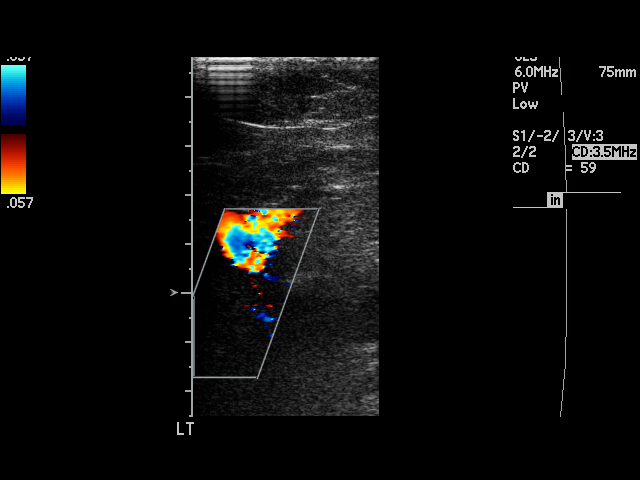
[im 7/23]
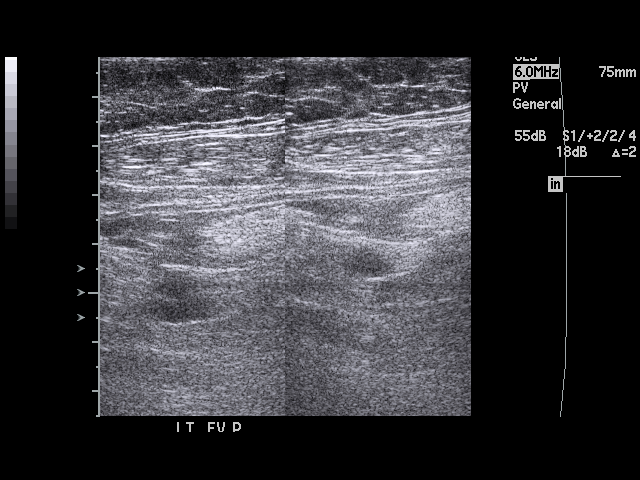
[im 8/23]
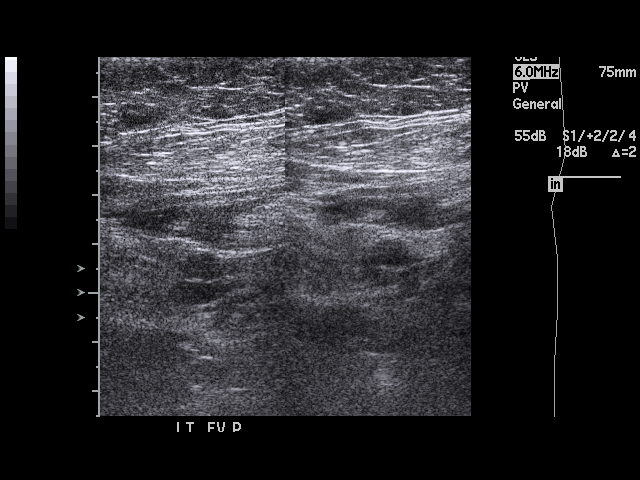
[im 9/23]
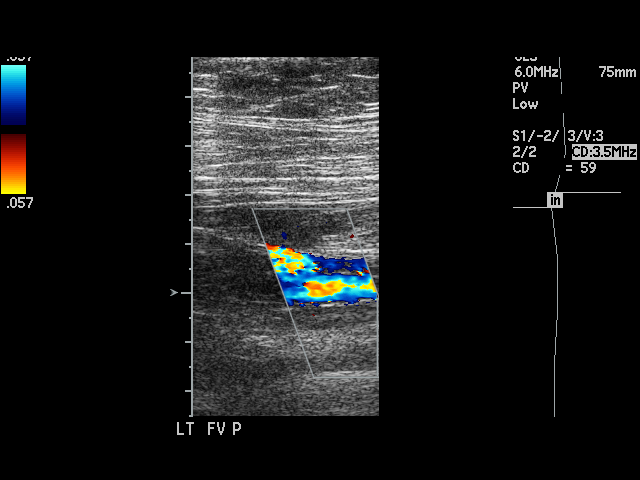
[im 11/23]
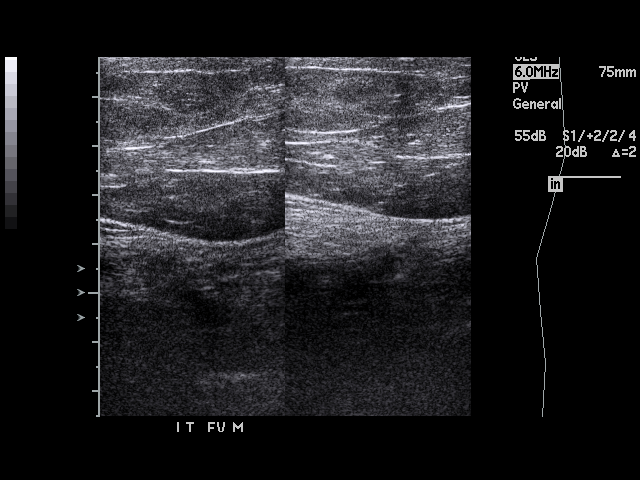
[im 12/23]
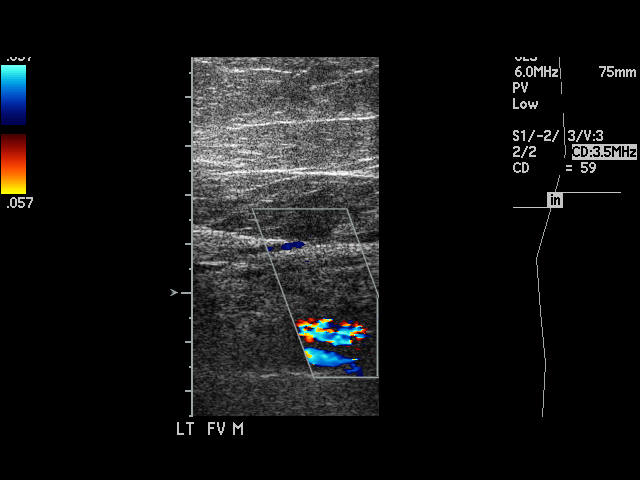
[im 13/23]
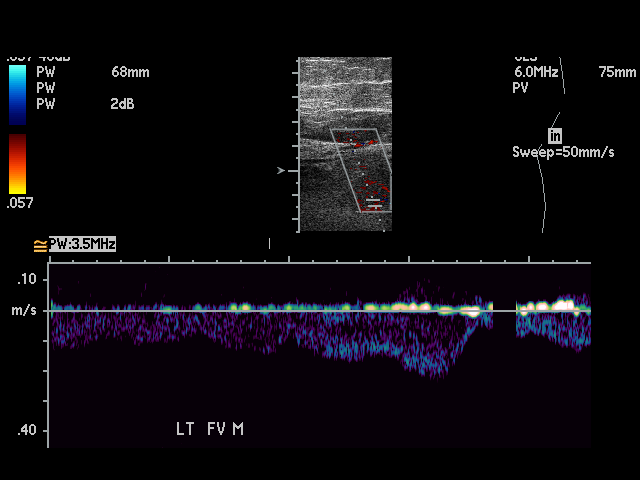
[im 15/23]
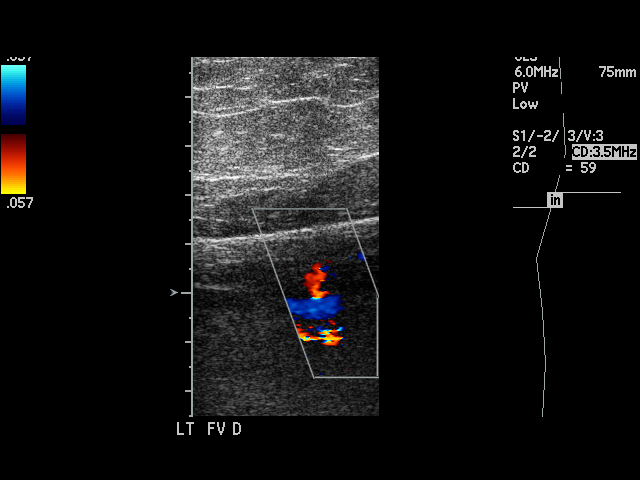
[im 16/23]
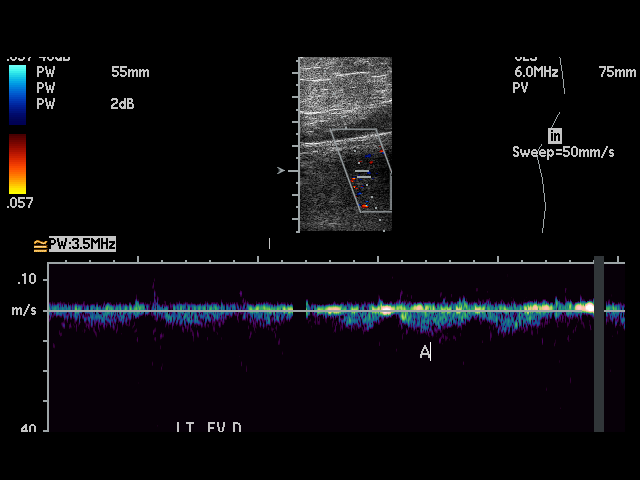
[im 17/23]
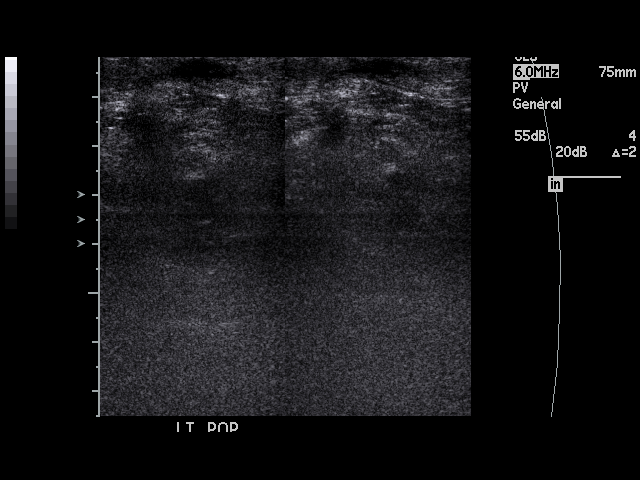
[im 19/23]
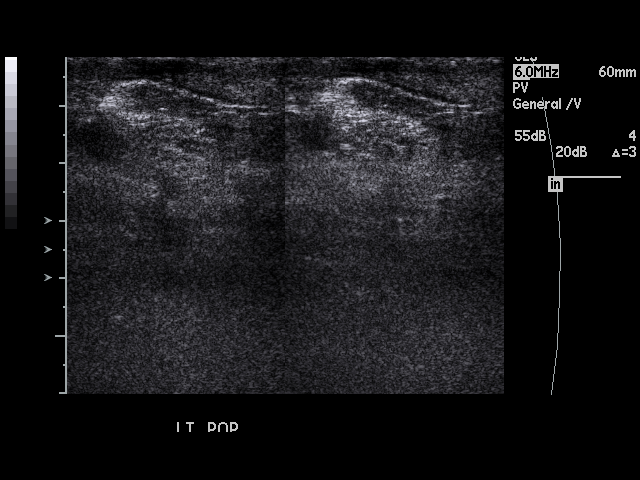
[im 20/23]
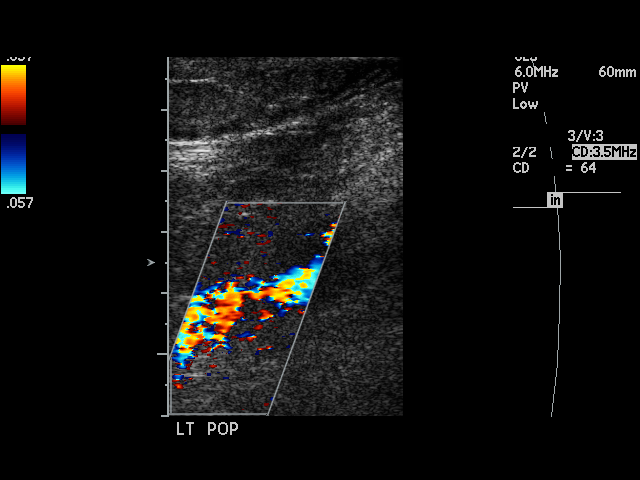
[im 21/23]
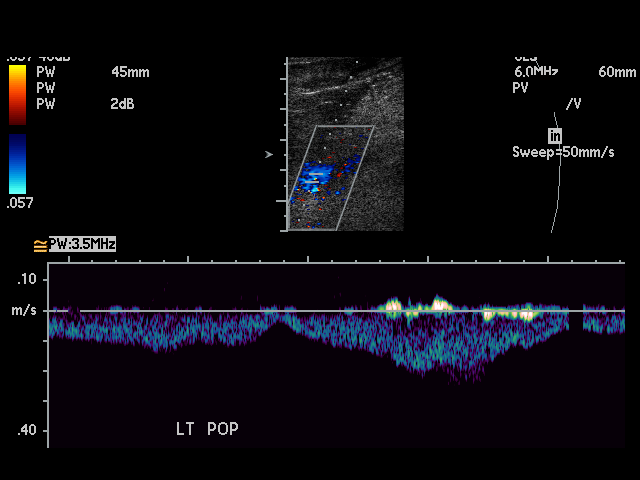
[im 23/23]
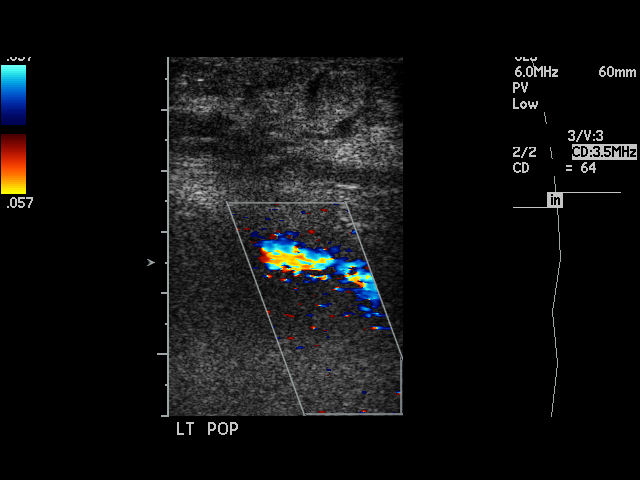

[17 of 23 positions shown; findings below may reference images not displayed]

PROCEDURE:     US  - US DOPPLER LOW EXTR LEFT  - June 22, 2008  [DATE]

RESULT:     Compression Doppler ultrasound of the deep venous system of the
left leg is performed from the inguinal through the popliteal region. The
deep venous system is fully compressible. The color and spectral Doppler
appearance is normal. There is no abnormal fluid collection. There is
increased proximal flow during distal calf compression.
IMPRESSION: 1. No evidence of left lower extremity deep vein thrombosis.

## 2010-09-18 ENCOUNTER — Ambulatory Visit: Payer: Self-pay | Admitting: Gastroenterology

## 2010-10-13 ENCOUNTER — Ambulatory Visit: Payer: Self-pay | Admitting: Urology

## 2011-02-23 ENCOUNTER — Ambulatory Visit: Payer: Self-pay | Admitting: Anesthesiology

## 2011-03-24 HISTORY — PX: TOTAL KNEE ARTHROPLASTY: SHX125

## 2011-03-24 HISTORY — PX: JOINT REPLACEMENT: SHX530

## 2011-04-16 ENCOUNTER — Other Ambulatory Visit: Payer: Self-pay | Admitting: Gastroenterology

## 2011-04-16 LAB — CLOSTRIDIUM DIFFICILE BY PCR

## 2011-04-17 LAB — WBCS, STOOL

## 2011-04-18 LAB — STOOL CULTURE

## 2011-04-22 ENCOUNTER — Ambulatory Visit: Payer: Self-pay | Admitting: Anesthesiology

## 2011-05-29 ENCOUNTER — Ambulatory Visit: Payer: Self-pay | Admitting: Gastroenterology

## 2011-05-29 LAB — PROTIME-INR
INR: 1
Prothrombin Time: 13.1 secs (ref 11.5–14.7)

## 2011-06-02 LAB — PATHOLOGY REPORT

## 2011-06-27 IMAGING — MG MM CAD SCREENING MAMMO
1 series · 8 of 8 positions shown · non-contrast
Comparison: none

REASON FOR EXAM: SCR
COMMENTS:

[Series 654: R CC · right · 8 of 8 slices shown]
[im 1/8]
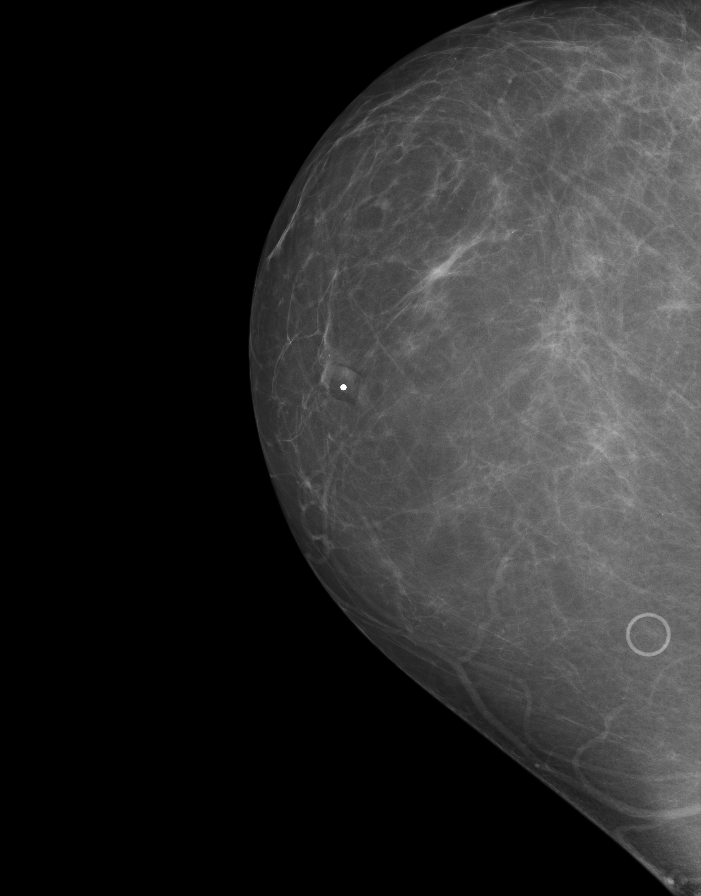
[im 2/8]
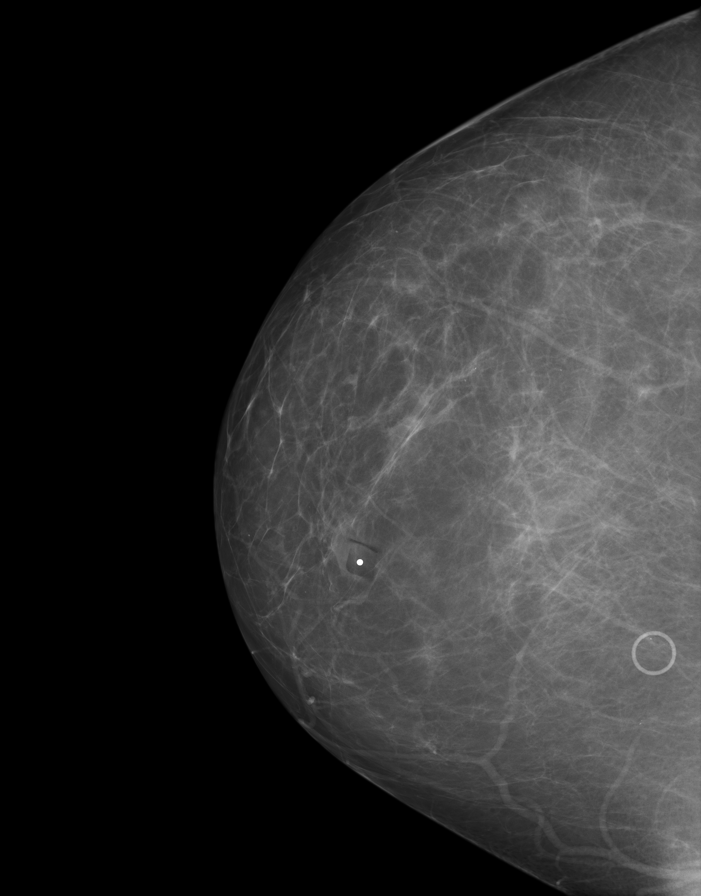
[im 3/8]
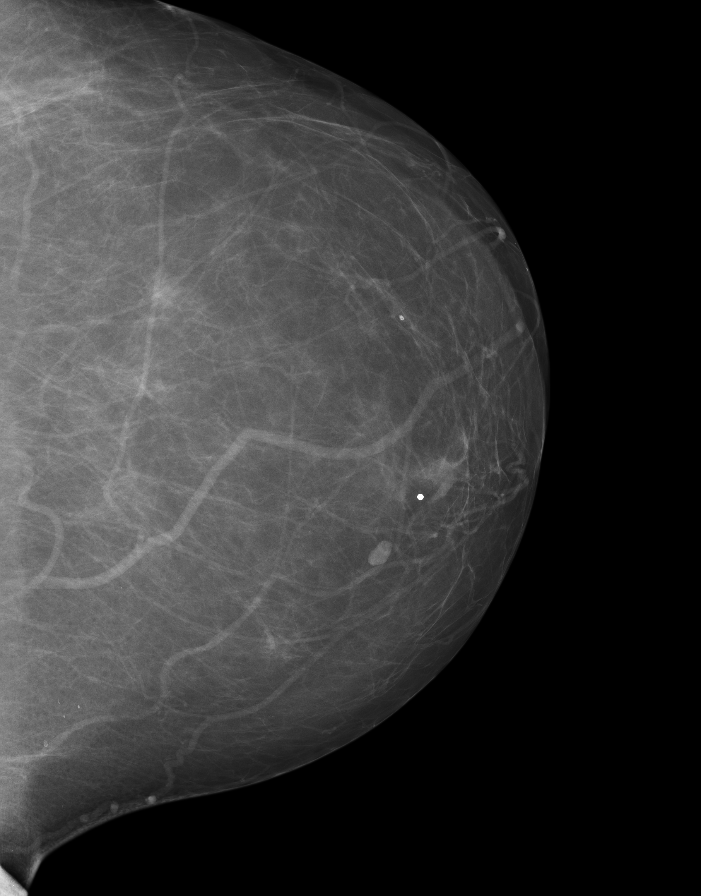
[im 4/8]
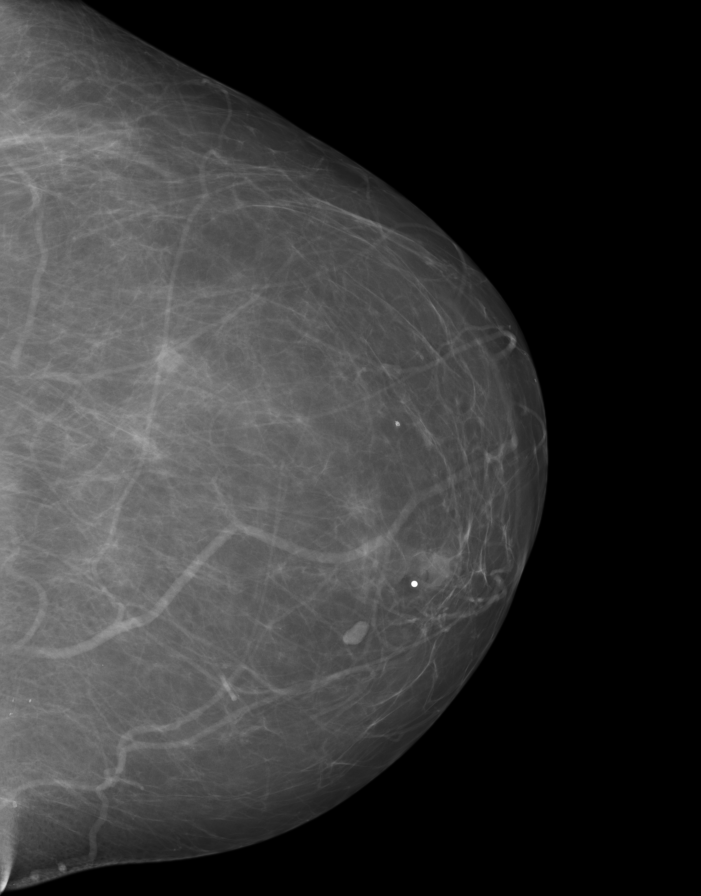
[im 5/8]
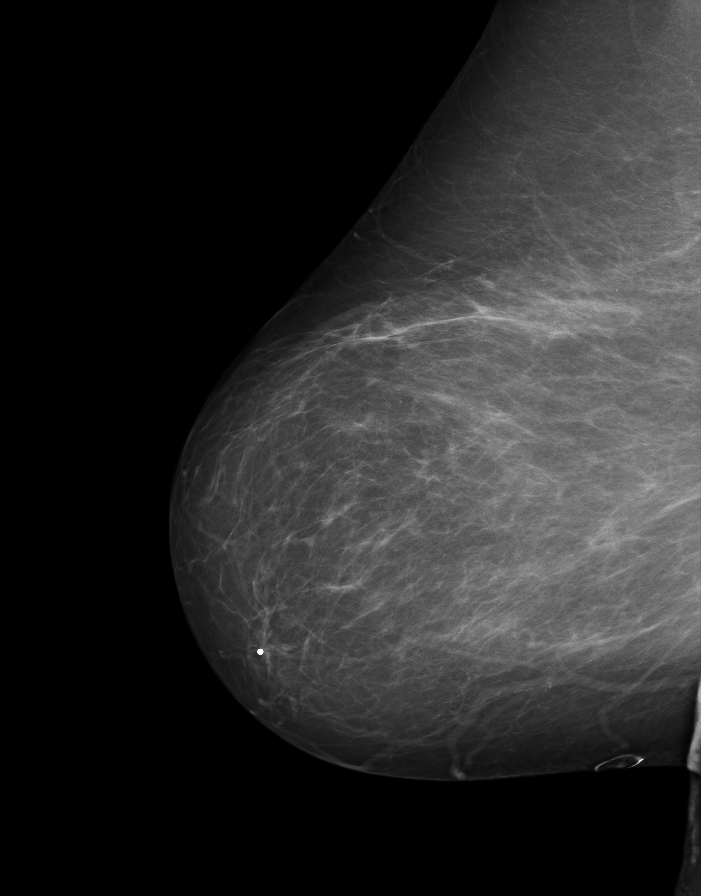
[im 6/8]
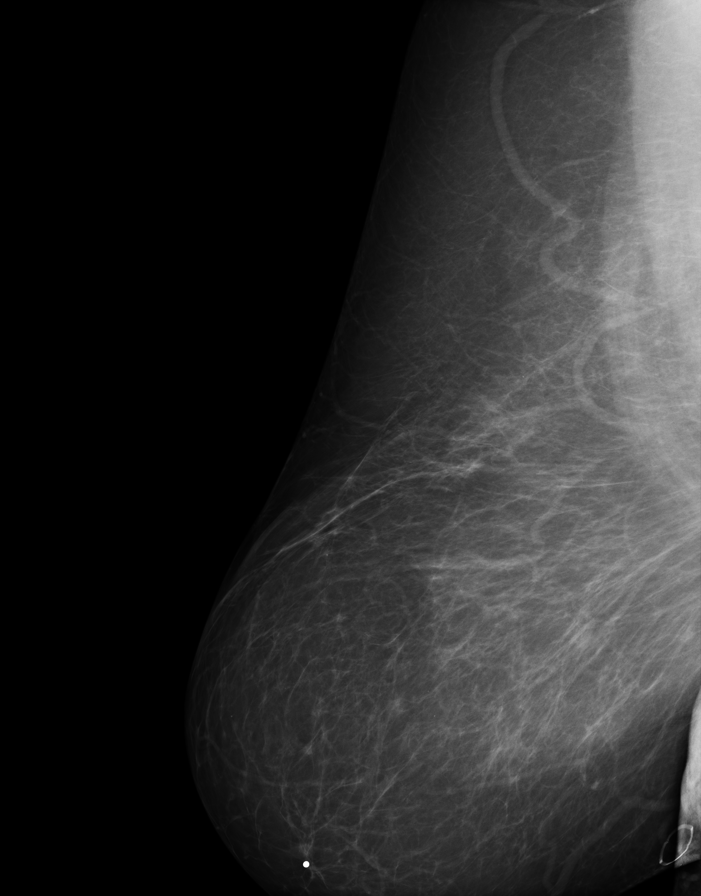
[im 7/8]
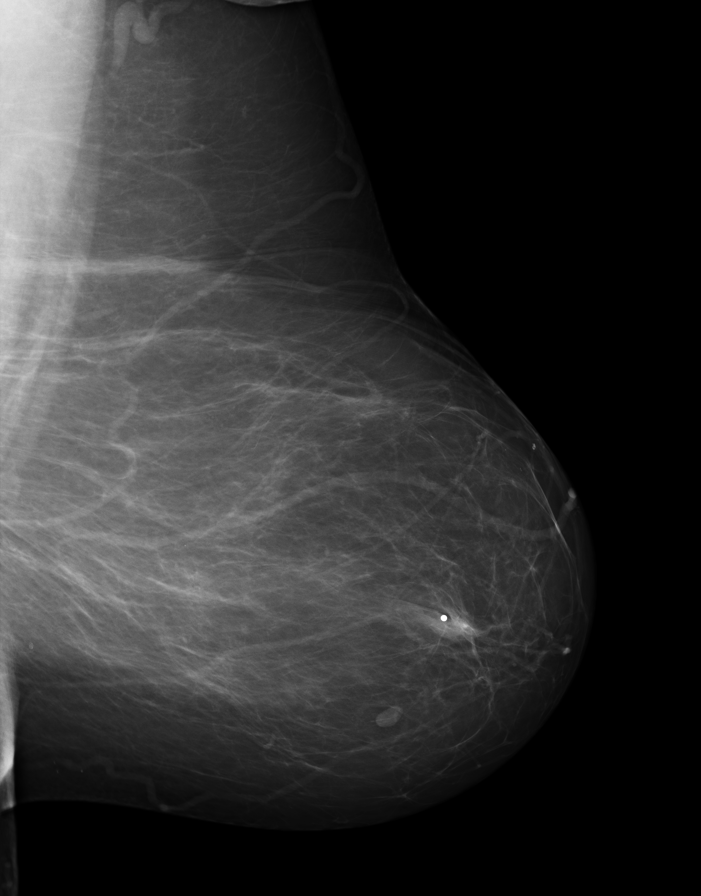
[im 8/8]
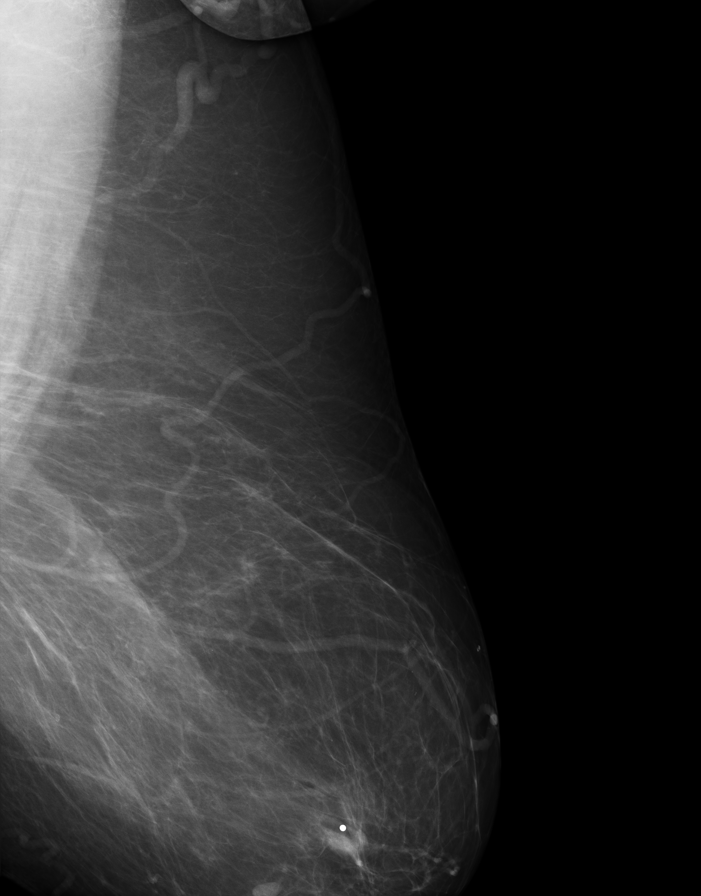

[8 of 8 positions shown; findings below may reference images not displayed]

PROCEDURE:     MAM - MAM DGTL SCREENING MAMMO W/CAD  - April 09, 2009  [DATE]

RESULT:     Comparison is made to the previous analog images of 02/09/2006
and to digital images from 03/27/2008.

The breasts exhibit a mildly dense parenchymal pattern. Lesion markers are
present over the right breast. The appearance is stable with no developing
density, dominant mass or malignant calcification. Nipple markers are
present bilaterally.
IMPRESSION: Stable, benign appearing bilateral mammogram.

BI-RADS: Category 2 - Benign Findings

RECOMMENDATION:  Please continue to encourage annual mammographic follow-up.

Thank you for this opportunity to contribute to the care of your patient.

A NEGATIVE MAMMOGRAM REPORT DOES NOT PRECLUDE BIOPSY OR OTHER EVALUATION OF
A CLINICALLY PALPABLE OR OTHERWISE SUSPICIOUS MASS OR LESION. BREAST CANCER
MAY NOT BE DETECTED BY MAMMOGRAPHY IN UP TO 10% OF CASES.

## 2011-07-21 ENCOUNTER — Ambulatory Visit: Payer: Self-pay | Admitting: Surgery

## 2011-07-21 LAB — CBC WITH DIFFERENTIAL/PLATELET
Basophil #: 0 10*3/uL (ref 0.0–0.1)
Basophil %: 0.7 %
Eosinophil #: 0.1 10*3/uL (ref 0.0–0.7)
Eosinophil %: 2.2 %
HCT: 34.2 % — ABNORMAL LOW (ref 35.0–47.0)
HGB: 11.2 g/dL — ABNORMAL LOW (ref 12.0–16.0)
Lymphocyte #: 0.9 10*3/uL — ABNORMAL LOW (ref 1.0–3.6)
Lymphocyte %: 14.3 %
MCH: 27.2 pg (ref 26.0–34.0)
MCHC: 32.6 g/dL (ref 32.0–36.0)
MCV: 84 fL (ref 80–100)
Monocyte #: 0.4 x10 3/mm (ref 0.2–0.9)
Monocyte %: 7.2 %
Neutrophil #: 4.6 10*3/uL (ref 1.4–6.5)
Neutrophil %: 75.6 %
Platelet: 235 10*3/uL (ref 150–440)
RBC: 4.1 10*6/uL (ref 3.80–5.20)
RDW: 16.3 % — ABNORMAL HIGH (ref 11.5–14.5)
WBC: 6.1 10*3/uL (ref 3.6–11.0)

## 2011-07-28 ENCOUNTER — Ambulatory Visit: Payer: Self-pay | Admitting: Surgery

## 2011-07-28 LAB — PROTIME-INR
INR: 0.9
Prothrombin Time: 12.1 secs (ref 11.5–14.7)

## 2011-07-29 LAB — PATHOLOGY REPORT

## 2011-08-25 ENCOUNTER — Ambulatory Visit: Payer: Self-pay | Admitting: Pain Medicine

## 2011-08-28 ENCOUNTER — Ambulatory Visit: Payer: Self-pay | Admitting: Gastroenterology

## 2011-12-01 IMAGING — CR Imaging study
1 series · 1 of 1 positions shown · non-contrast
Comparison: 02/04/2010 ultrasound

CLINICAL DATA: Abdominal pain

NUCLEAR MEDICINE HEPATOBILIARY IMAGING WITH GALLBLADDER EF
TECHNIQUE: Sequential images of the abdomen were obtained [DATE] minutes following intravenous administration of
radiopharmaceutical.  After slow intravenous infusion of
micrograms Cholecystokinin, gallbladder ejection fraction was
determined.
Radiopharmaceutical:  5.64 mCi Lc-BBm Choletec

[view not recorded]
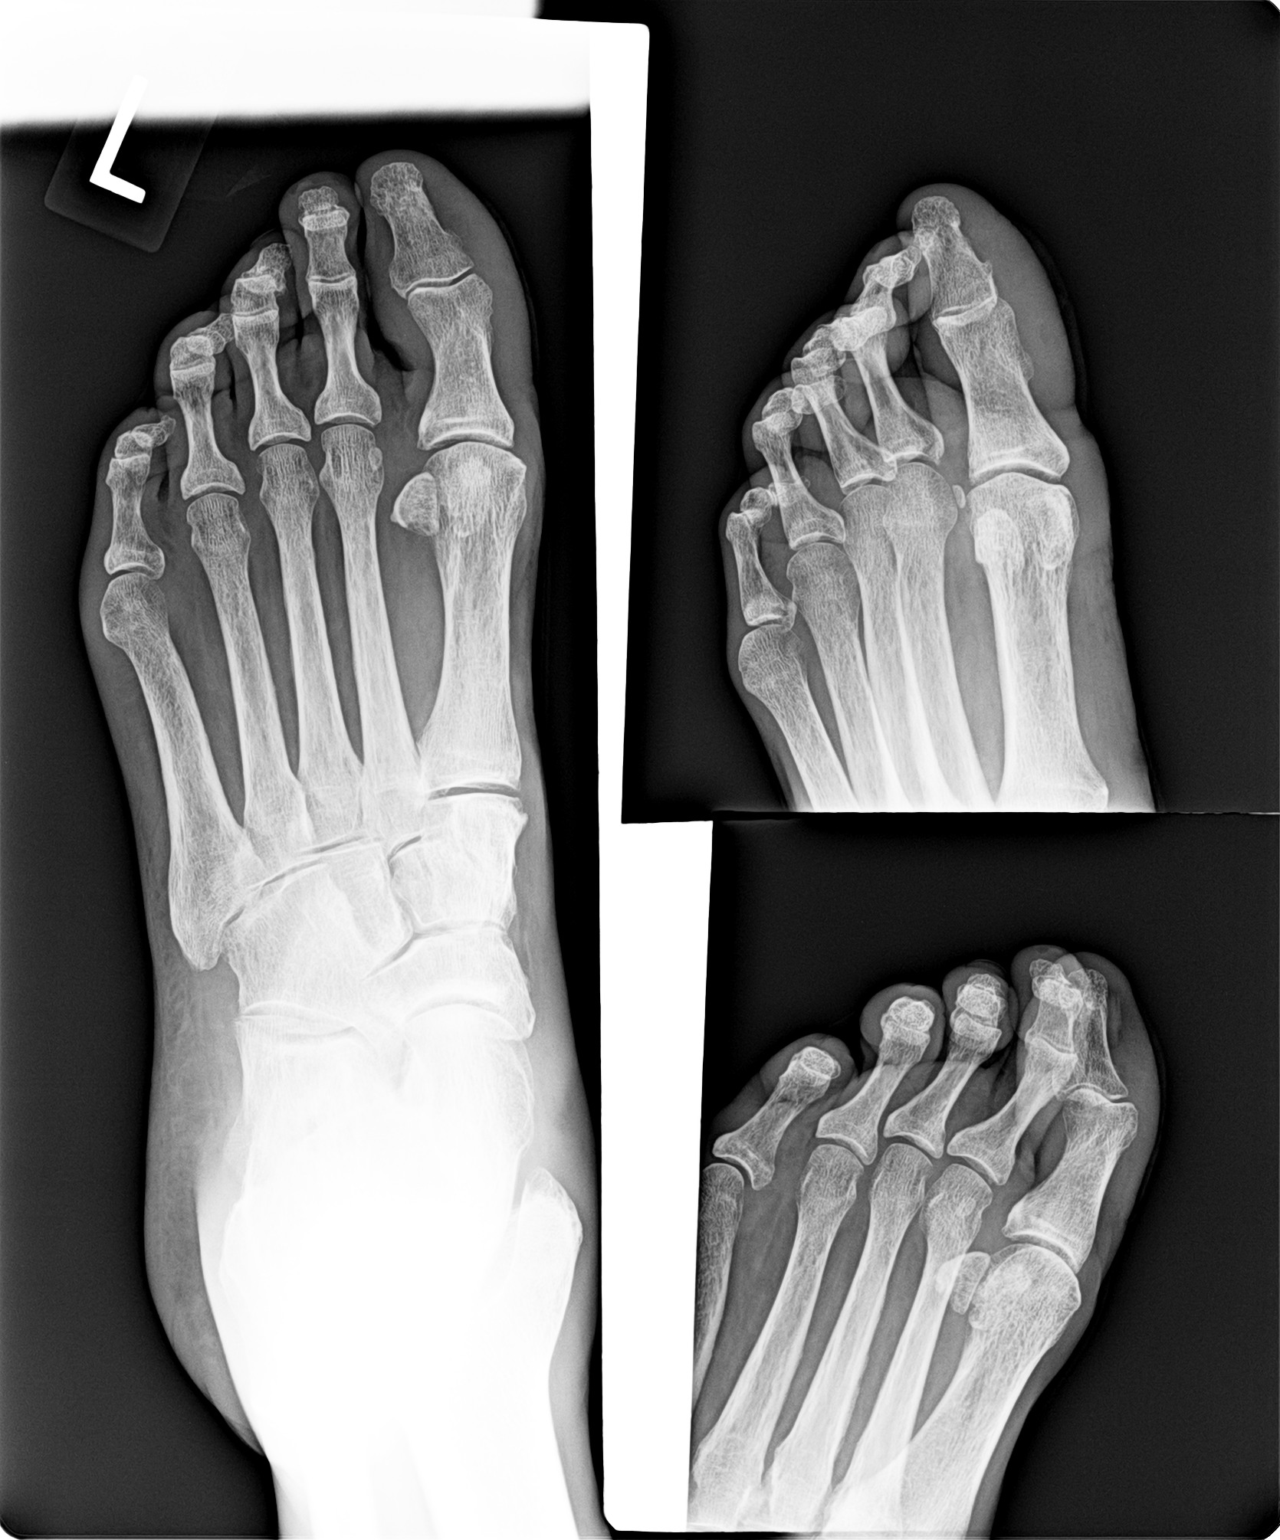

[1 of 1 positions shown; findings below may reference images not displayed]

FINDINGS: Normal hepatic uptake and excretion of the radiotracer
into the biliary system.  Gallbladder is well visualized.  Activity
progresses into the small bowel.  Therefore the cystic duct and
common bile duct are patent.

CCK was administered and a gallbladder ejection fraction was
calculated after 30 minutes measuring 86%.  This is within normal
limits.

The patient did not have symptoms during CCK infusion.
IMPRESSION: Normal hepatobiliary scan.

## 2011-12-22 ENCOUNTER — Ambulatory Visit: Payer: Self-pay | Admitting: Specialist

## 2011-12-28 IMAGING — CR Imaging study
2 series · 2 of 2 positions shown · non-contrast
Comparison: None.

CLINICAL DATA: Preop for surgery on a left gluteal cyst, former
smoker

CHEST - 2 VIEW

[view not recorded (1 of 2)]
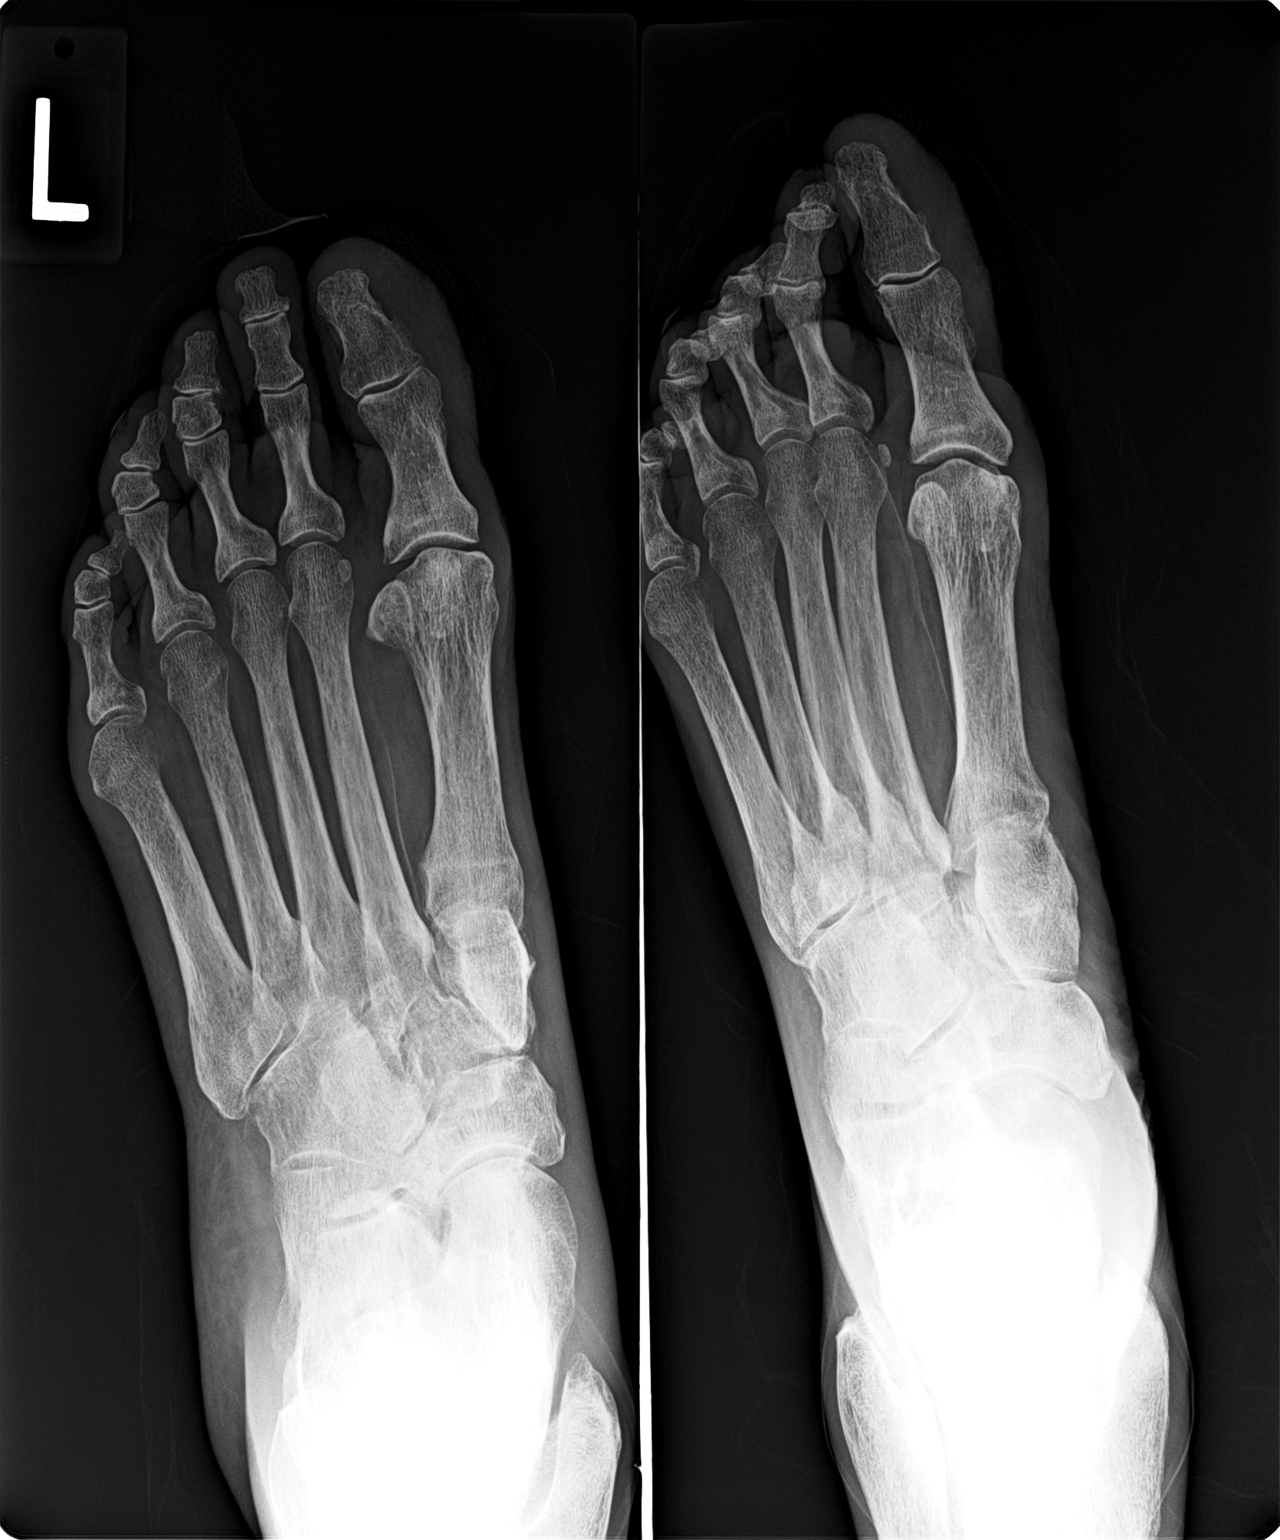

[view not recorded (2 of 2)]
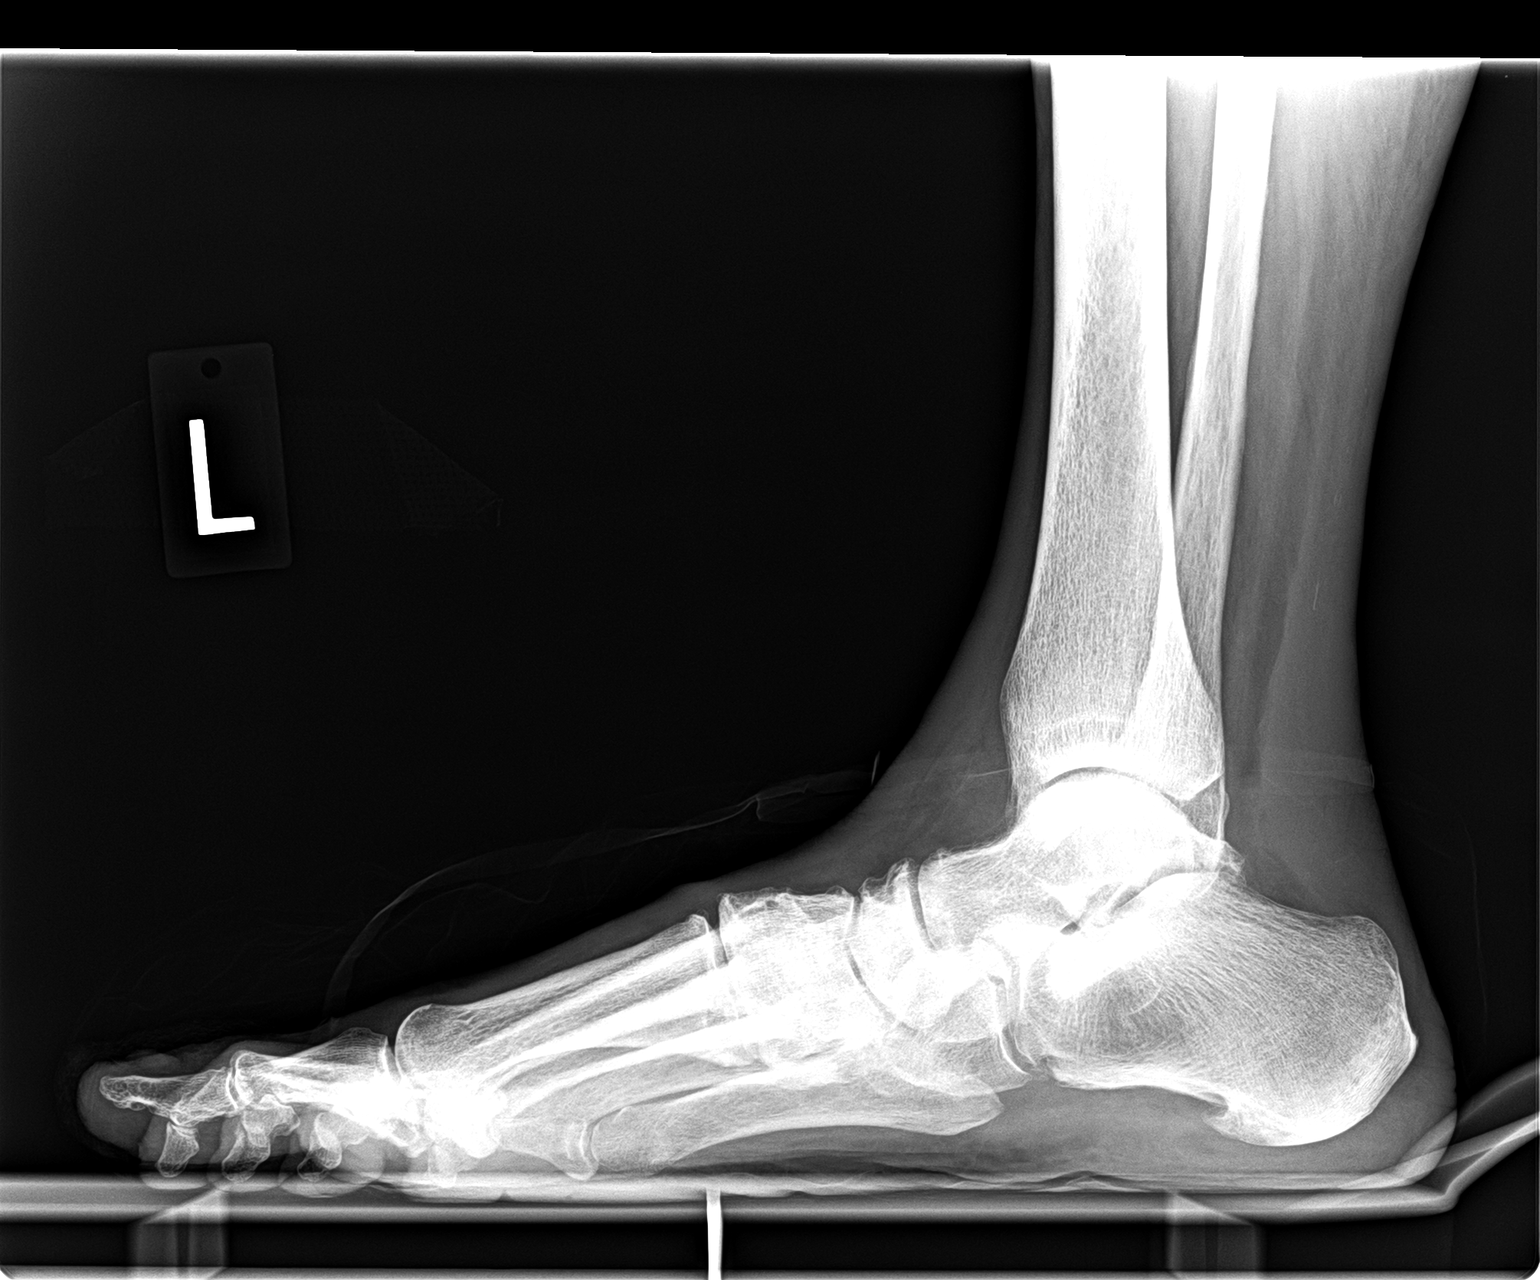

[2 of 2 positions shown; findings below may reference images not displayed]

FINDINGS: The lungs are clear.  Mediastinal contours appear normal.
The heart is within normal limits in size.  No bony abnormality is
seen.
IMPRESSION: No active lung disease.

## 2011-12-29 ENCOUNTER — Ambulatory Visit: Payer: Self-pay | Admitting: Specialist

## 2011-12-29 LAB — POTASSIUM: Potassium: 4.4 mmol/L (ref 3.5–5.1)

## 2012-01-06 ENCOUNTER — Encounter: Payer: Self-pay | Admitting: Specialist

## 2012-01-11 ENCOUNTER — Ambulatory Visit: Payer: Self-pay | Admitting: Pain Medicine

## 2012-01-19 ENCOUNTER — Ambulatory Visit: Payer: Self-pay | Admitting: Pain Medicine

## 2012-01-22 ENCOUNTER — Encounter: Payer: Self-pay | Admitting: Specialist

## 2012-02-11 ENCOUNTER — Ambulatory Visit: Payer: Self-pay | Admitting: Pain Medicine

## 2012-03-25 ENCOUNTER — Encounter: Payer: Self-pay | Admitting: Specialist

## 2012-04-23 ENCOUNTER — Encounter: Payer: Self-pay | Admitting: Specialist

## 2012-05-02 ENCOUNTER — Ambulatory Visit: Payer: Self-pay | Admitting: Pain Medicine

## 2012-05-26 ENCOUNTER — Encounter: Payer: Self-pay | Admitting: Orthopedic Surgery

## 2012-06-14 ENCOUNTER — Encounter: Payer: Self-pay | Admitting: Specialist

## 2012-06-21 ENCOUNTER — Encounter: Payer: Self-pay | Admitting: Specialist

## 2012-07-21 ENCOUNTER — Encounter: Payer: Self-pay | Admitting: Specialist

## 2012-08-08 ENCOUNTER — Ambulatory Visit: Payer: Self-pay

## 2012-08-10 DIAGNOSIS — I482 Chronic atrial fibrillation, unspecified: Secondary | ICD-10-CM | POA: Insufficient documentation

## 2012-08-12 ENCOUNTER — Ambulatory Visit: Payer: Self-pay | Admitting: Bariatrics

## 2012-08-12 LAB — COMPREHENSIVE METABOLIC PANEL
Albumin: 3.7 g/dL (ref 3.4–5.0)
Alkaline Phosphatase: 95 U/L (ref 50–136)
Anion Gap: 6 — ABNORMAL LOW (ref 7–16)
BUN: 22 mg/dL — ABNORMAL HIGH (ref 7–18)
Bilirubin,Total: 0.3 mg/dL (ref 0.2–1.0)
Calcium, Total: 8.8 mg/dL (ref 8.5–10.1)
Chloride: 106 mmol/L (ref 98–107)
Creatinine: 0.72 mg/dL (ref 0.60–1.30)
EGFR (African American): >60
EGFR (Non-African Amer.): >60
SGOT(AST): 26 U/L (ref 15–37)
SGPT (ALT): 33 U/L (ref 12–78)
Sodium: 140 mmol/L (ref 136–145)

## 2012-08-12 LAB — CBC WITH DIFFERENTIAL/PLATELET
Basophil #: 0 10*3/uL (ref 0.0–0.1)
Basophil %: 0.8 %
Eosinophil #: 0.2 10*3/uL (ref 0.0–0.7)
HGB: 11.9 g/dL — ABNORMAL LOW (ref 12.0–16.0)
Lymphocyte %: 12.1 %
MCH: 28.6 pg (ref 26.0–34.0)
MCV: 85 fL (ref 80–100)
Monocyte #: 0.5 x10 3/mm (ref 0.2–0.9)
Platelet: 210 10*3/uL (ref 150–440)
RBC: 4.15 10*6/uL (ref 3.80–5.20)
WBC: 5.8 10*3/uL (ref 3.6–11.0)

## 2012-08-12 LAB — PHOSPHORUS: Phosphorus: 3.4 mg/dL (ref 2.5–4.9)

## 2012-08-12 LAB — HEMOGLOBIN A1C: Hemoglobin A1C: 7.7 % — ABNORMAL HIGH (ref 4.2–6.3)

## 2012-08-12 LAB — LIPASE, BLOOD: Lipase: 120 U/L (ref 73–393)

## 2012-08-12 LAB — FERRITIN: Ferritin (ARMC): 19 ng/mL (ref 8–388)

## 2012-08-12 LAB — IRON AND TIBC
Iron Saturation: 18 %
Iron: 74 ug/dL (ref 50–170)

## 2012-08-12 LAB — PROTIME-INR
INR: 1.1
Prothrombin Time: 14.4 secs (ref 11.5–14.7)

## 2012-08-12 LAB — FOLATE: Folic Acid: 19.2 ng/mL (ref 3.1–100.0)

## 2012-08-18 ENCOUNTER — Ambulatory Visit: Payer: Self-pay | Admitting: Pain Medicine

## 2012-08-21 ENCOUNTER — Encounter: Payer: Self-pay | Admitting: Specialist

## 2012-08-22 IMAGING — MG MM CAD SCREENING MAMMO
1 series · 6 of 6 positions shown · non-contrast
Comparison: none

REASON FOR EXAM: SCR
COMMENTS:

PROCEDURE:     MAM - MAM DGTL SCREENING MAMMO W/CAD  - June 05, 2010  [DATE]
RESULT:

[R CC · right · 6 of 6 slices shown]
[im 1/6]
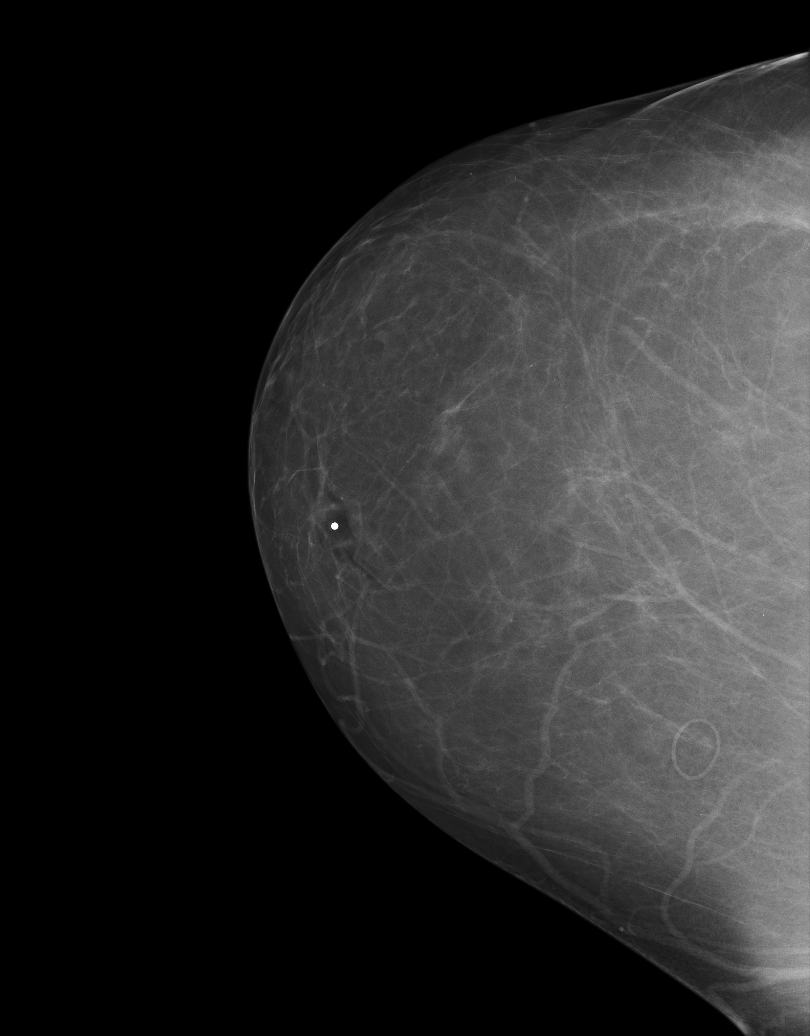
[im 2/6]
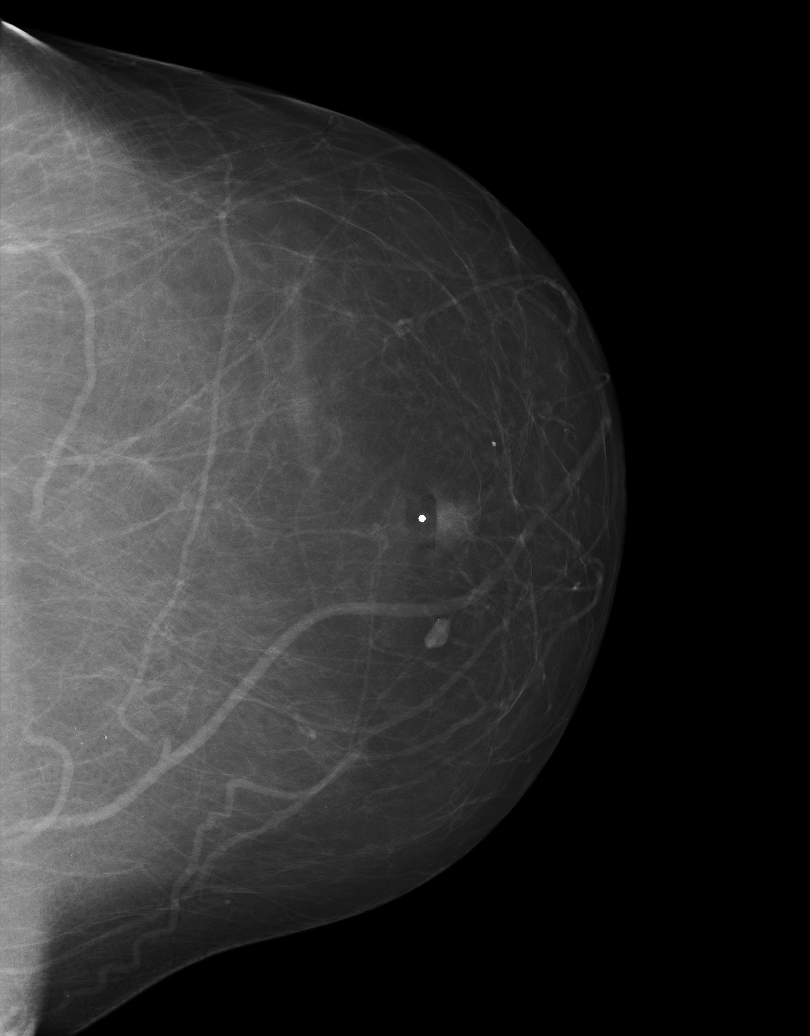
[im 3/6]
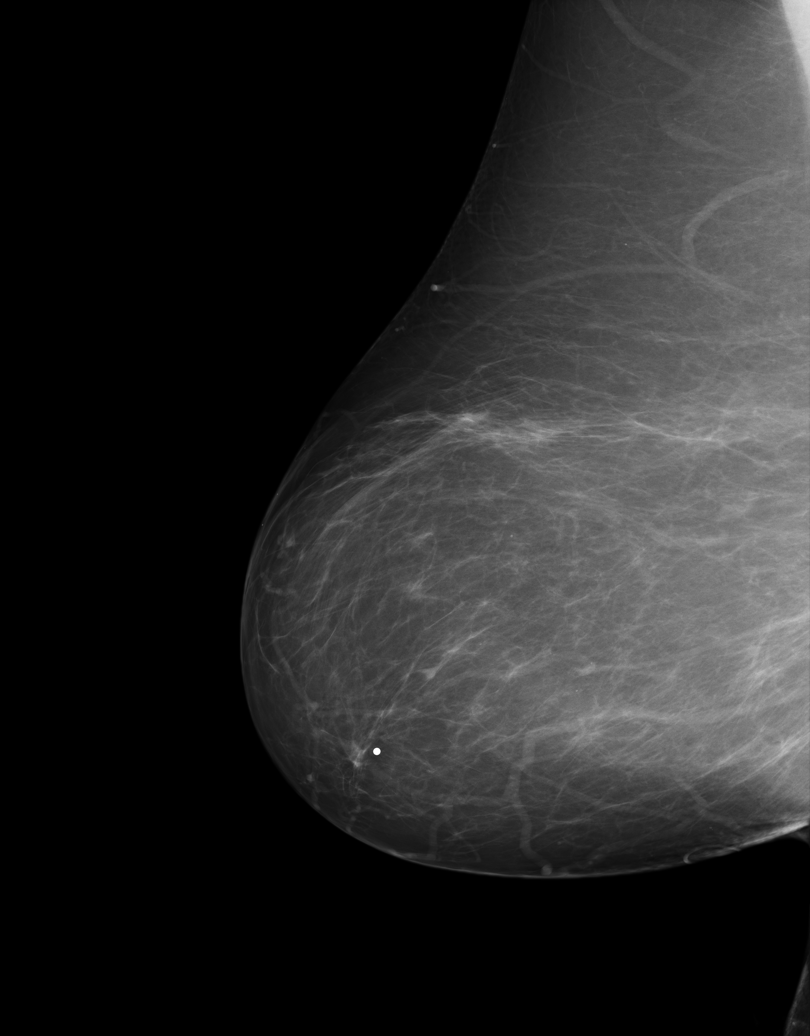
[im 4/6]
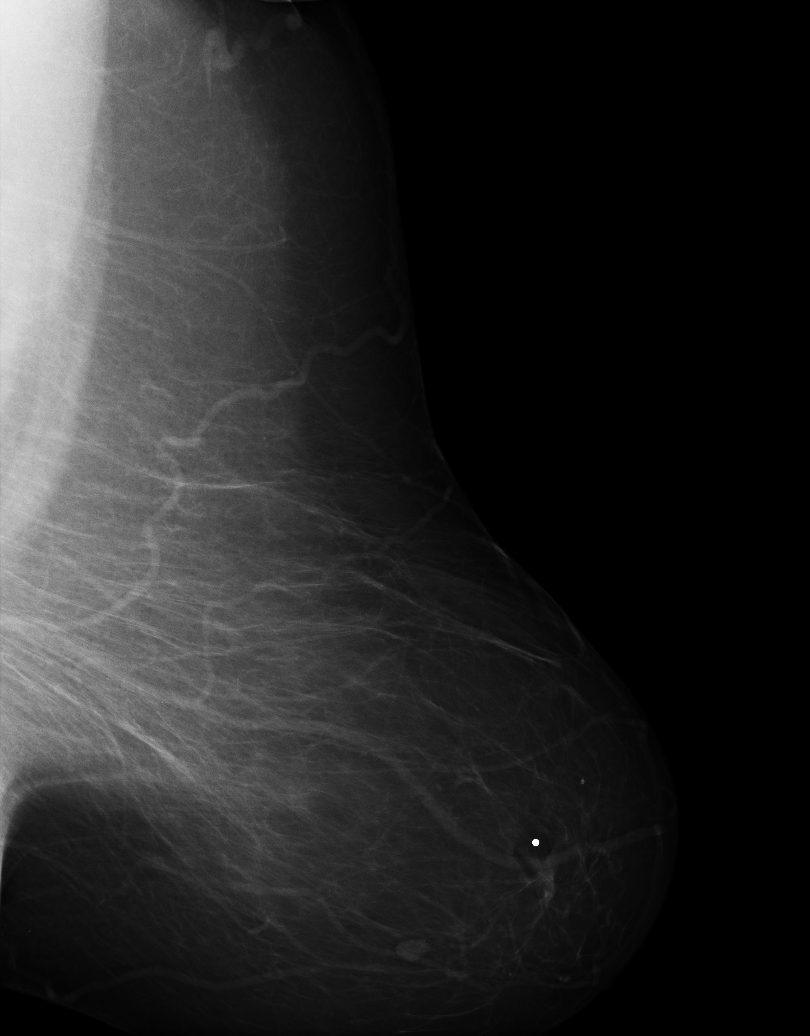
[im 5/6]
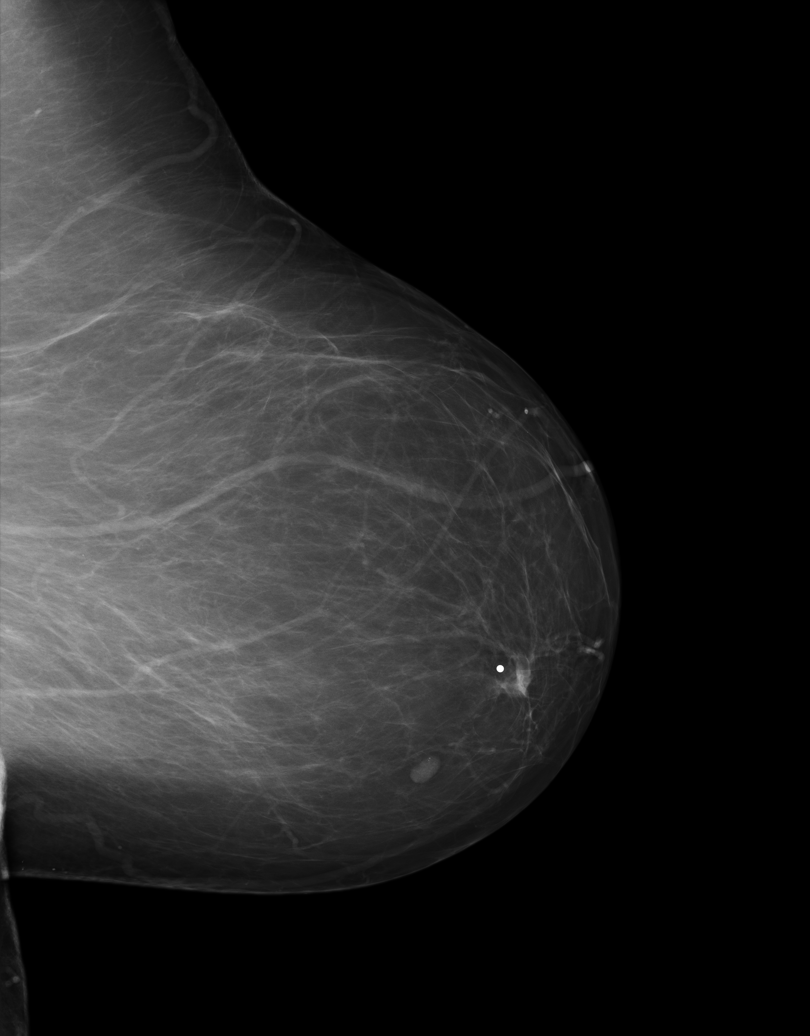
[im 6/6]
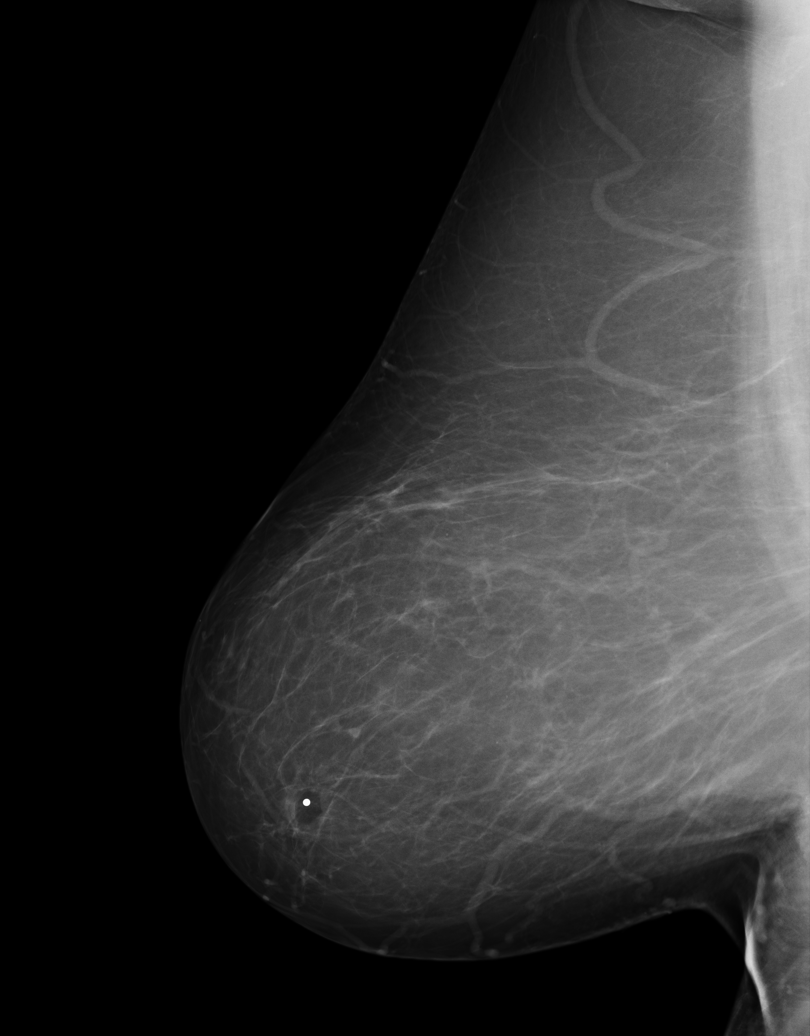

[6 of 6 positions shown; findings below may reference images not displayed]

FINDINGS: Comparison is made to analog images dated 02/09/2006 and to
digital images performed on 04/09/2009 as well as 03/27/2008.

The breasts exhibit a mildly dense parenchymal pattern with a stable,
elliptical nodular density in the anterior third of the left breast inferior
and medial to the nipple. Nipple markers are placed bilaterally as the
nipple is not in profile. Scattered, benign appearing calcifications are
present. A skin lesion is marked medially on the right breast.
IMPRESSION: Stable, benign appearing bilateral mammogram.

BI-RADS: Category 2 - Benign Findings

RECOMMENDATION:  Please continue to encourage annual mammographic follow-up.

Thank you for this opportunity to contribute to the care of your patient.

A NEGATIVE MAMMOGRAM REPORT DOES NOT PRECLUDE BIOPSY OR OTHER EVALUATION OF
A CLINICALLY PALPABLE OR OTHERWISE SUSPICIOUS MASS OR LESION. BREAST CANCER
MAY NOT BE DETECTED BY MAMMOGRAPHY IN UP TO 10% OF CASES.

## 2012-08-23 ENCOUNTER — Ambulatory Visit: Payer: Self-pay

## 2012-08-24 ENCOUNTER — Ambulatory Visit: Payer: Self-pay | Admitting: Pain Medicine

## 2012-08-25 ENCOUNTER — Ambulatory Visit: Payer: Self-pay | Admitting: Bariatrics

## 2012-09-07 ENCOUNTER — Ambulatory Visit: Payer: Self-pay | Admitting: Urology

## 2012-09-20 ENCOUNTER — Ambulatory Visit: Payer: Self-pay | Admitting: Bariatrics

## 2012-09-20 ENCOUNTER — Encounter: Payer: Self-pay | Admitting: Specialist

## 2012-09-29 ENCOUNTER — Other Ambulatory Visit: Payer: Self-pay | Admitting: Podiatry

## 2012-09-30 LAB — SYNOVIAL CELL COUNT + DIFF, W/ CRYSTALS
Basophil: 0 %
Eosinophil: 0 %
Lymphocytes: 44 %
Neutrophils: 50 %
Nucleated Cell Count: 253 /mm3
Other Cells BF: 0 %
Other Mononuclear Cells: 6 %

## 2012-10-21 ENCOUNTER — Encounter: Payer: Self-pay | Admitting: Specialist

## 2012-10-30 IMAGING — CR DG ABDOMEN 1V
1 series · 2 of 2 positions shown · non-contrast
Comparison: none

REASON FOR EXAM: kidney stone
COMMENTS:

[Series 1: view not recorded · 0.17mm/px · 2 of 2 slices shown]
[im 1/2]
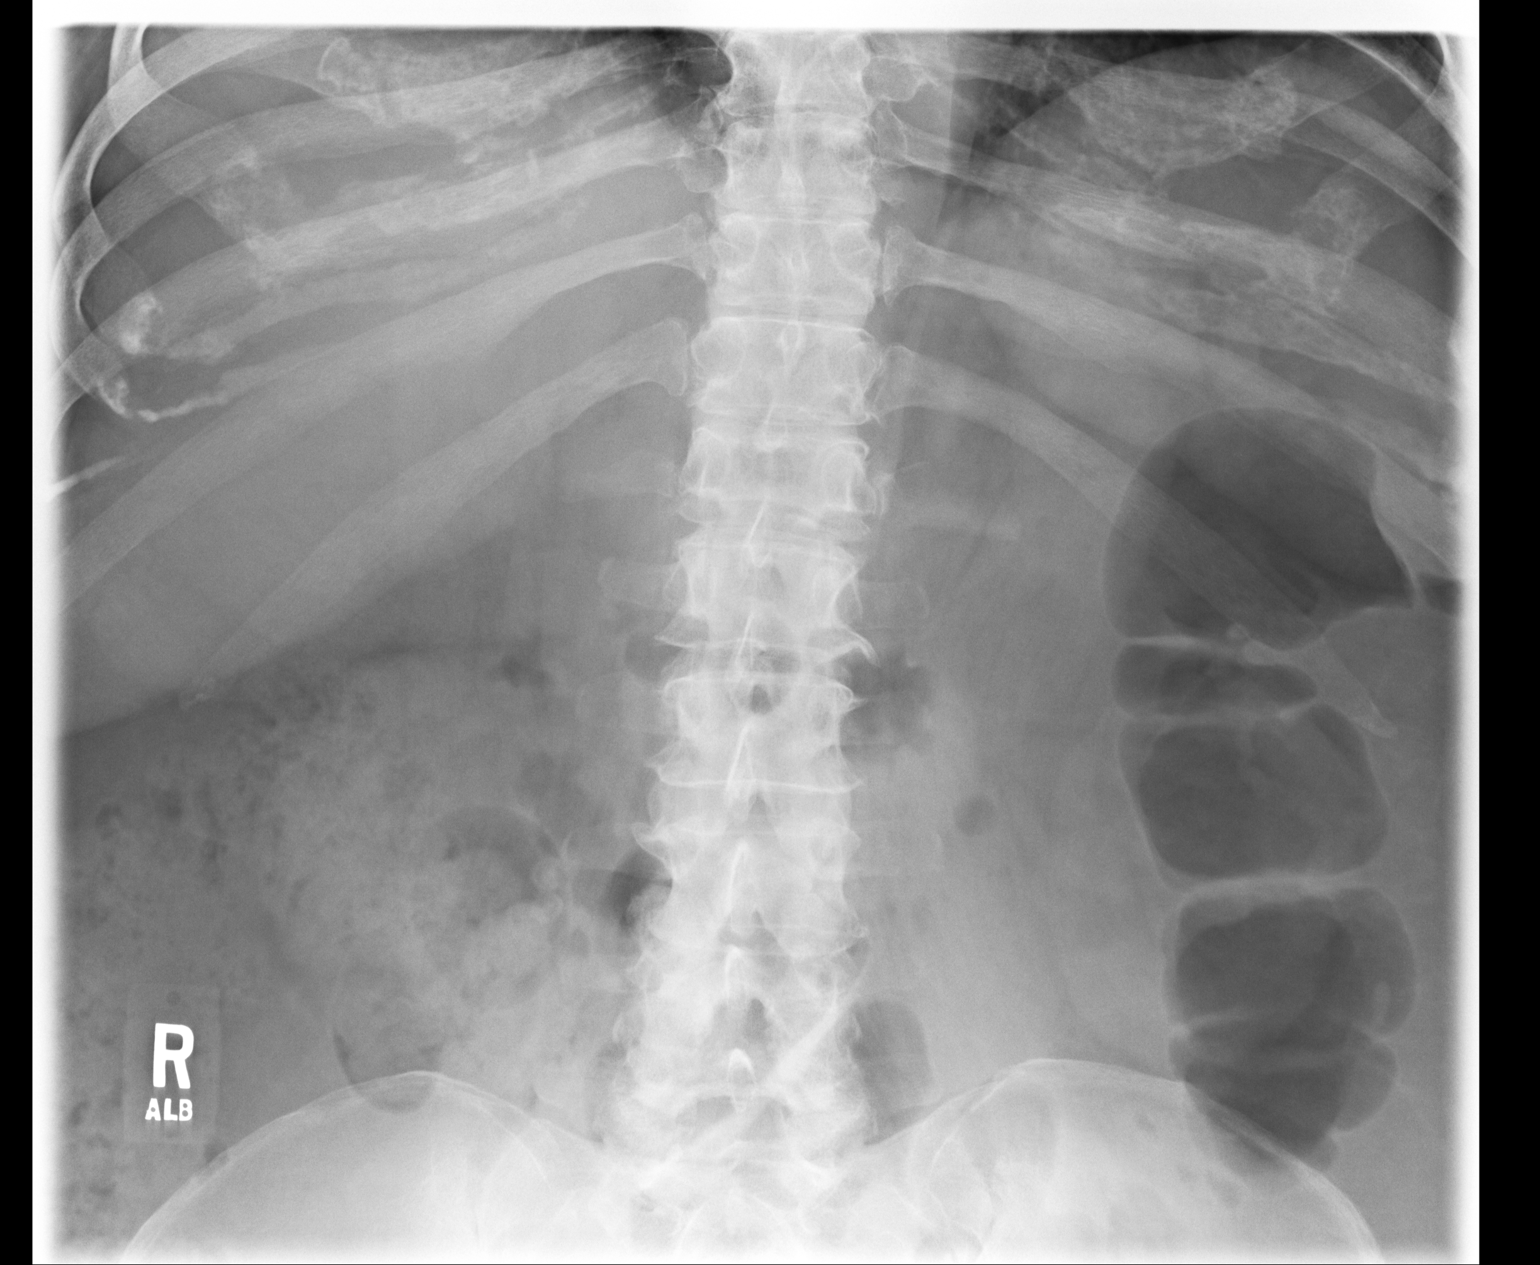
[im 2/2]
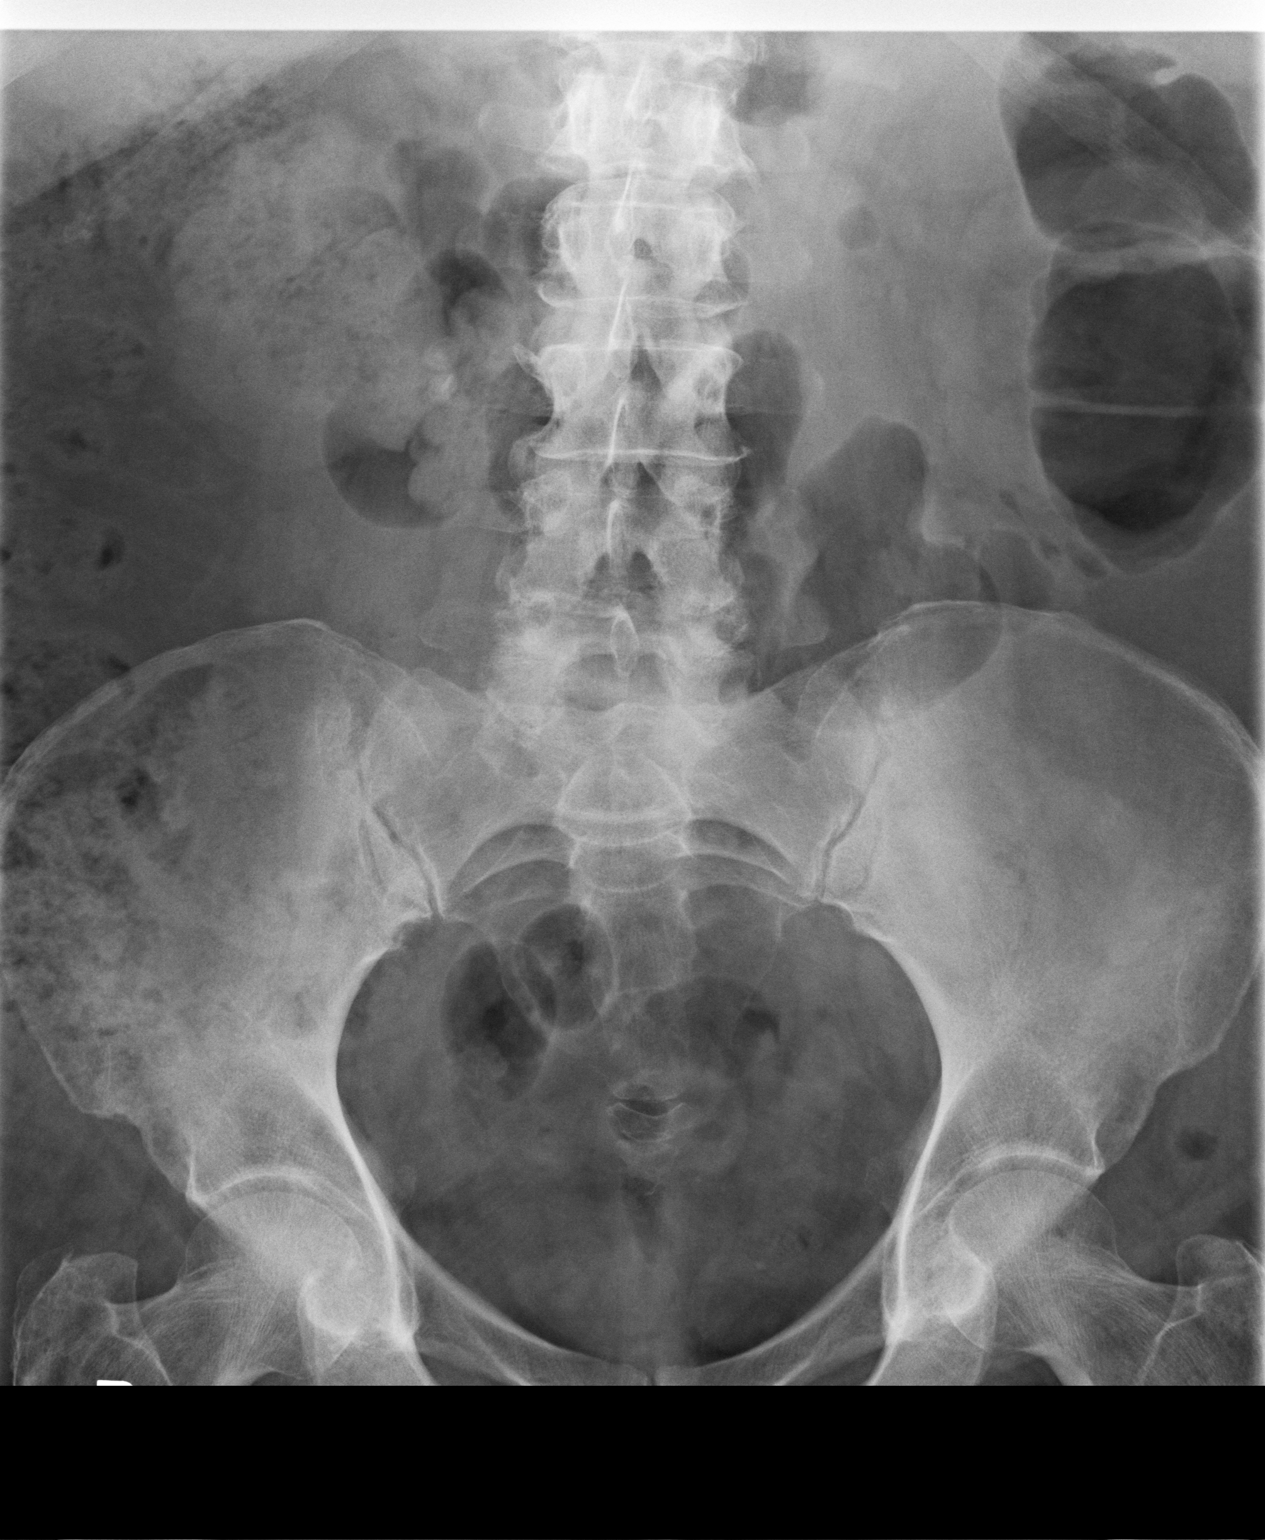

[2 of 2 positions shown; findings below may reference images not displayed]

PROCEDURE:     MDR - MDR KIDNEY URETER BLADDER  - August 13, 2010  [DATE]

RESULT:     Comparison is made to a prior exam of 12/09/2006. The current
exam reveals no definite intrarenal calcifications. Small stones or faint
stones could be present and obscured. There are noted two concentric
densities on the right near the level of the L3 lumbar transverse process.
These likely represent artifact or possibly gallstones.
IMPRESSION: 1. No definite renal or ureteral calcifications are identified by routine
radiography.
2. There are two concentric radiodensities at the level of the L3 lumbar
transverse process on the right and which likely represents artifact or
possibly gallstones.

## 2012-11-09 ENCOUNTER — Ambulatory Visit: Payer: Self-pay | Admitting: Gastroenterology

## 2012-11-14 IMAGING — CR DG IVP HYPERTENSIVE
1 series · 8 of 10 positions shown · non-contrast
Comparison: none

REASON FOR EXAM: gross hematuria
COMMENTS:

[Series 1: view not recorded · 0.17mm/px · 8 of 17 slices shown]
[im 1/17]
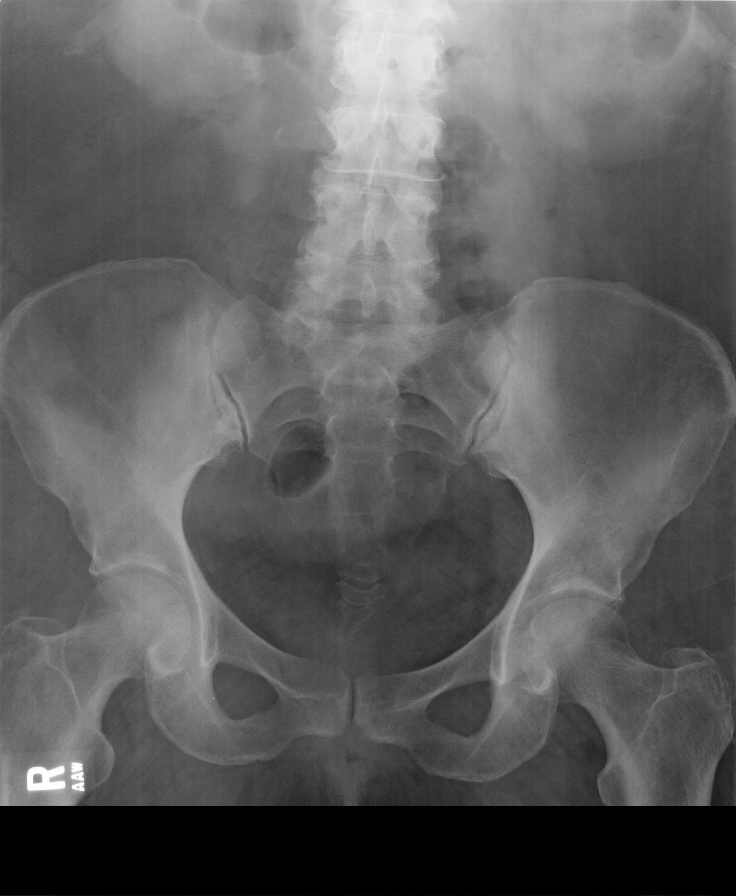
[im 2/17]
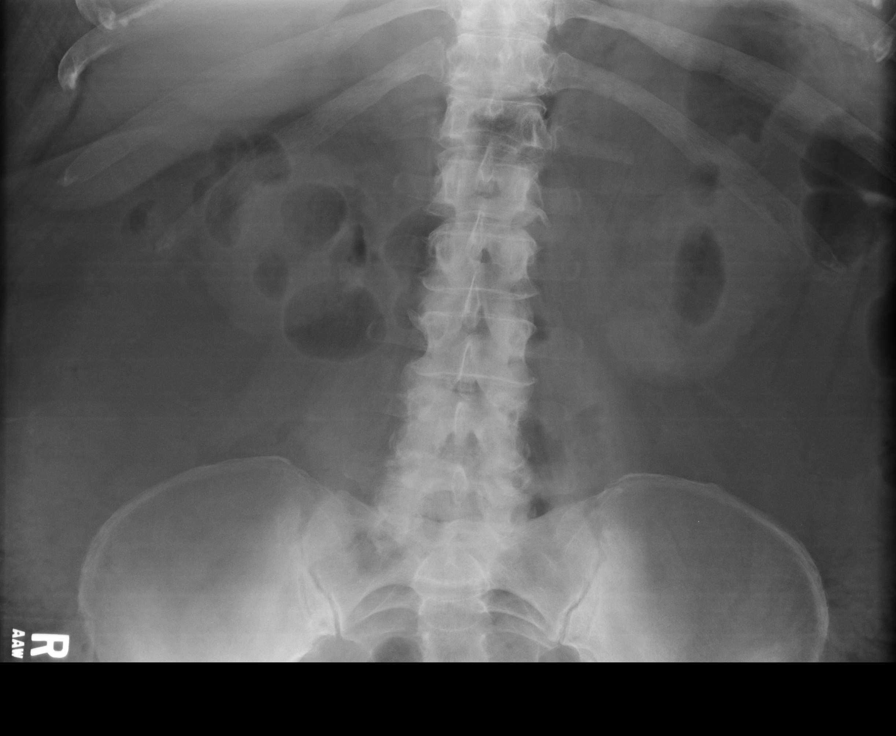
[im 4/17]
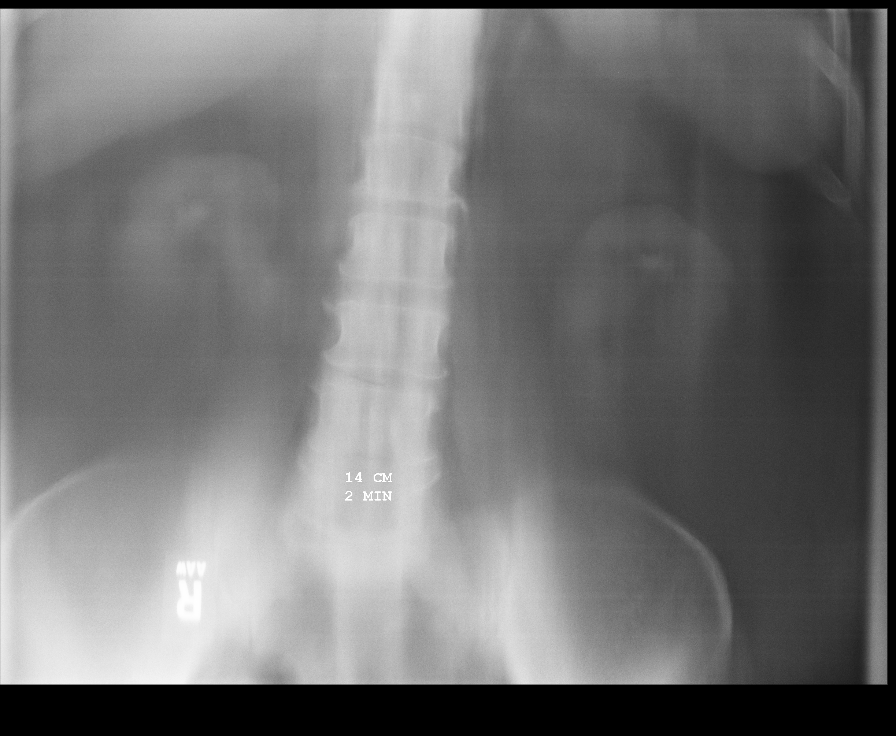
[im 6/17]
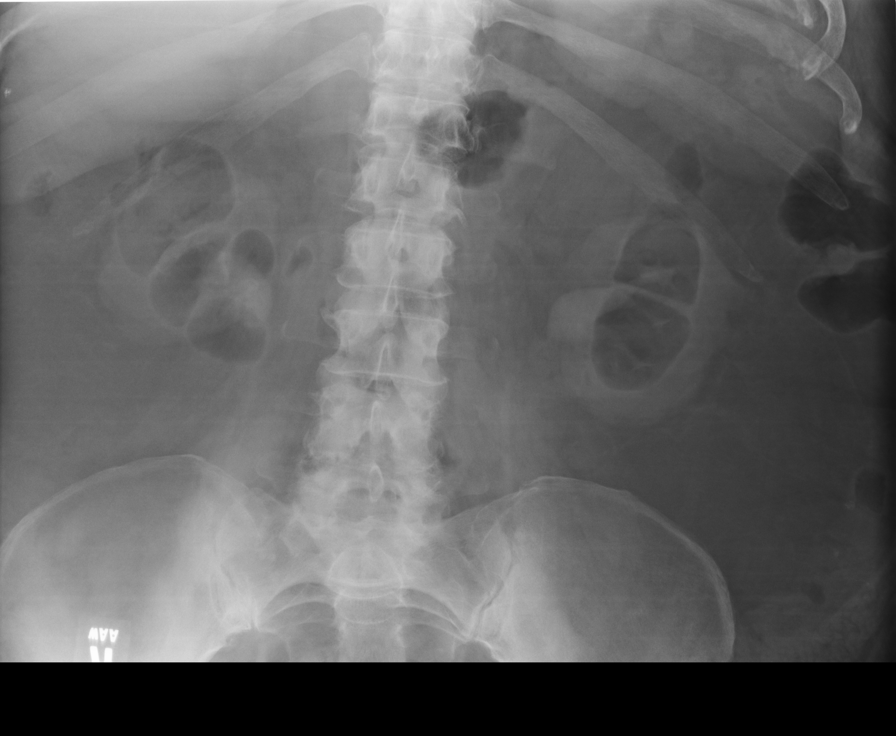
[im 8/17]
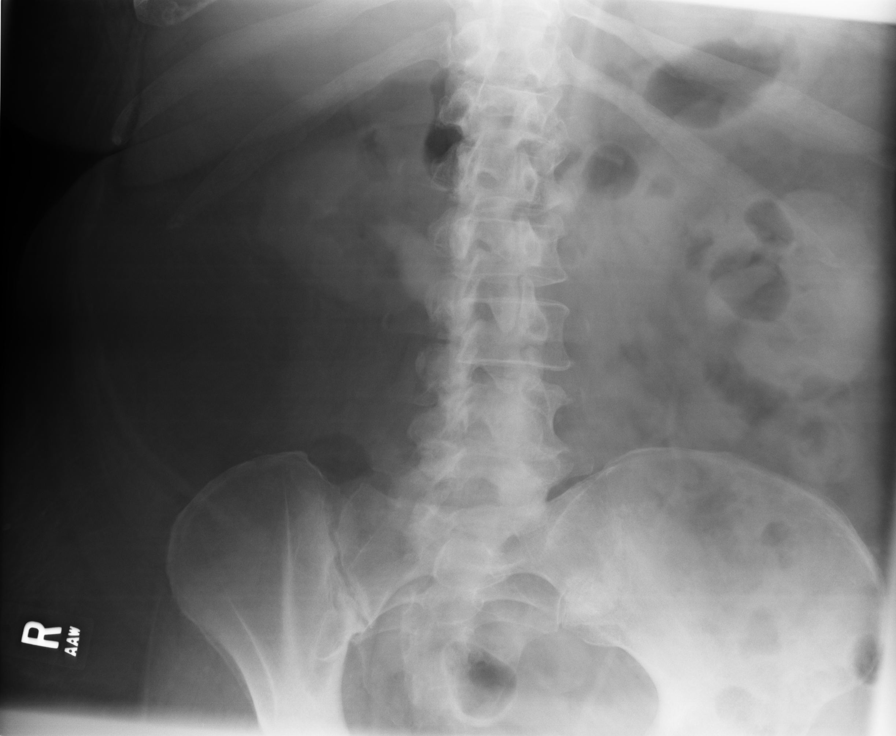
[im 9/17]
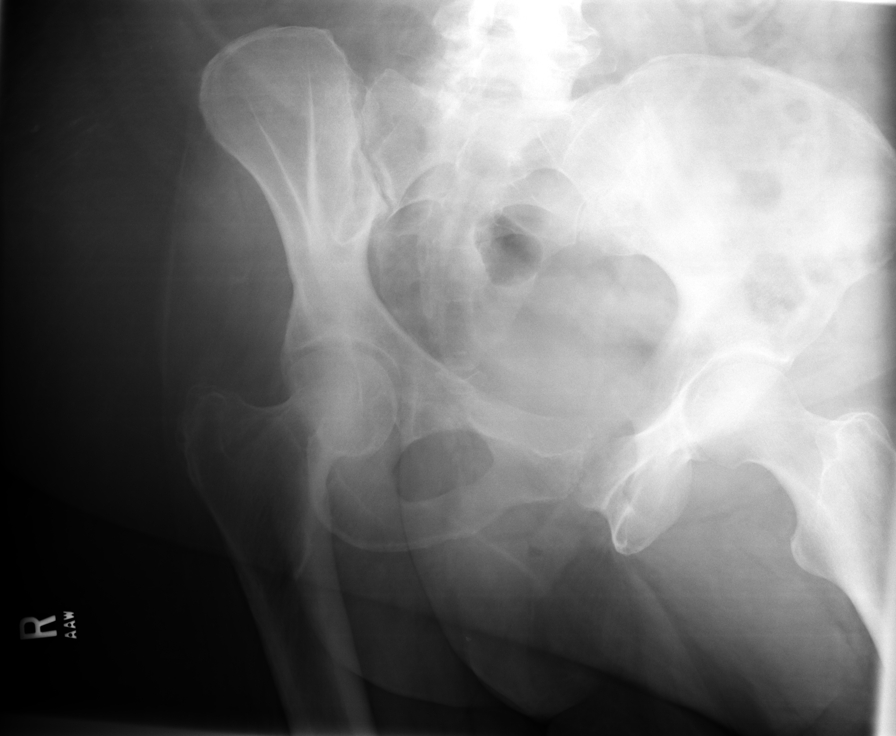
[im 11/17]
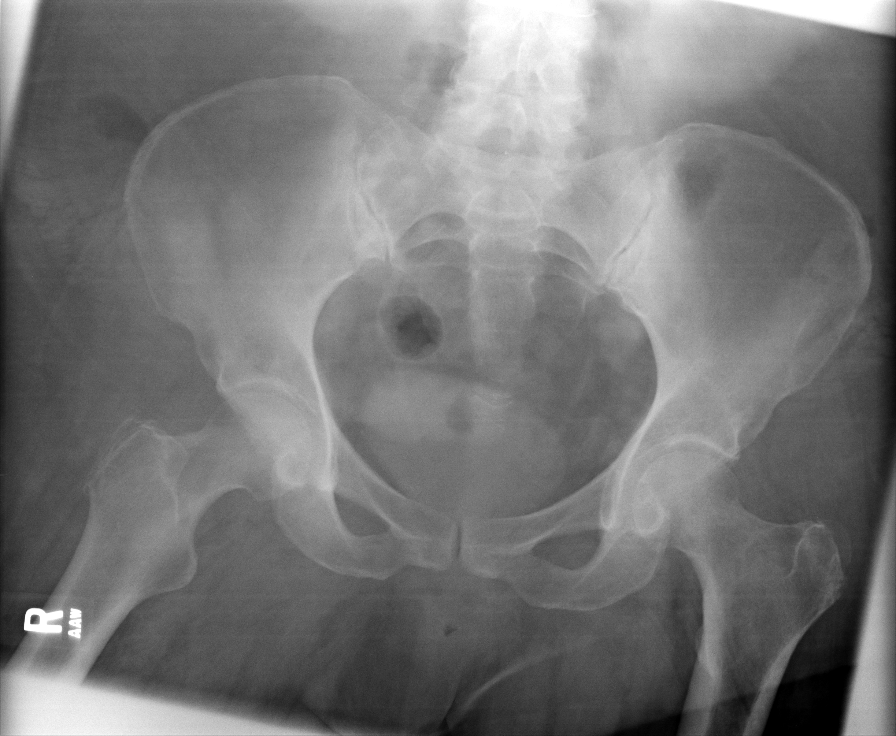
[im 13/17]
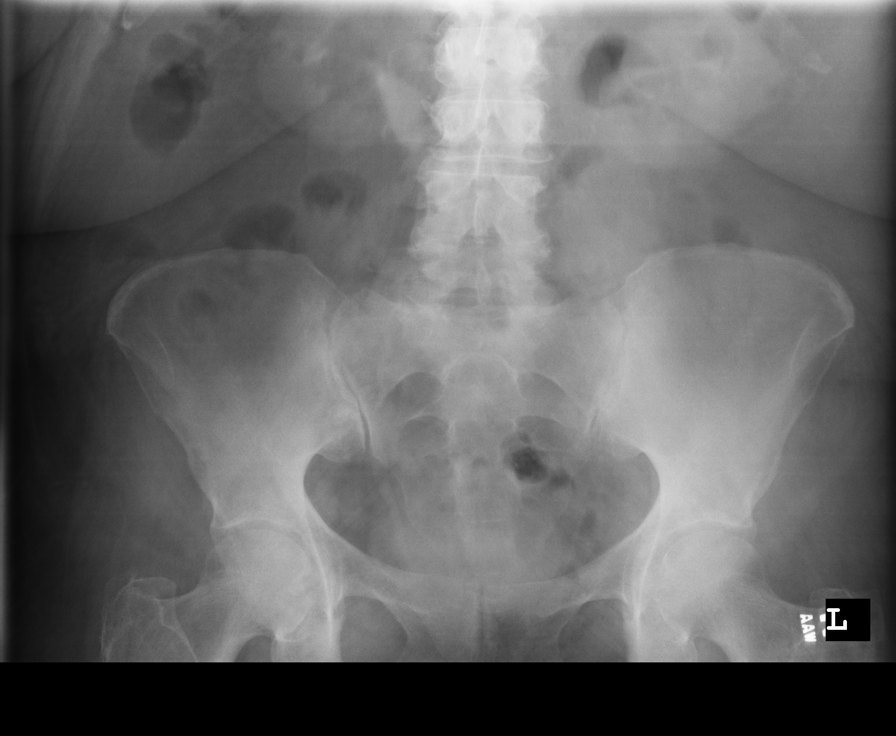

[8 of 10 positions shown; findings below may reference images not displayed]

PROCEDURE:     DXR - DXR INTRAVENOUS UROGRAPHY (IVP)  - August 28, 2010 [DATE]

RESULT:     The anticipated procedure was discussed with Ms. Calva. The
patient voiced her willingness to proceed. The scout film reveals a normal
bowel gas pattern. No abnormal calcifications are demonstrated over the
kidneys. Mild degenerative change of the lumbar spine is present.

Following intravenous administration of 50 cc of [id number] the renal
outlines are reasonably well defined. The right kidney measures
approximately 9.8 cm in length and the left kidney approximately 10.1cm. The
intrarenal collecting systems are poorly demonstrated. There is no evidence
of obstruction. There is fractional visualization of the ureters. The
partially distended urinary bladder is grossly normal. The post void film
reveals no significant residual urinary bladder volume.
IMPRESSION: 1. The study is quite limited due to the patient's body habitus.
2. I do not see definite contour deformities of either kidney. Five detail
of the renal collecting systems is lacking though there is no evidence of
obstruction.
3. There is fractional visualization of the ureters.
4. The limited evaluation of the urinary bladder possible on this study
reveals no acute abnormality.

Consideration could be given performing CT scanning if the patient's
hematuria persists and remains unexplained.

## 2012-11-16 ENCOUNTER — Ambulatory Visit: Payer: Self-pay | Admitting: Bariatrics

## 2012-11-16 DIAGNOSIS — I499 Cardiac arrhythmia, unspecified: Secondary | ICD-10-CM

## 2012-11-16 LAB — CBC WITH DIFFERENTIAL/PLATELET
Basophil #: 0 10*3/uL (ref 0.0–0.1)
Basophil %: 0.5 %
Eosinophil #: 0.3 10*3/uL (ref 0.0–0.7)
Eosinophil %: 4 %
HCT: 41.3 % (ref 35.0–47.0)
HGB: 13.8 g/dL (ref 12.0–16.0)
Lymphocyte #: 1 10*3/uL (ref 1.0–3.6)
Lymphocyte %: 12.9 %
MCH: 29.5 pg (ref 26.0–34.0)
MCHC: 33.5 g/dL (ref 32.0–36.0)
MCV: 88 fL (ref 80–100)
Monocyte #: 0.6 x10 3/mm (ref 0.2–0.9)
Monocyte %: 7.1 %
Neutrophil #: 6 10*3/uL (ref 1.4–6.5)
Neutrophil %: 75.5 %
Platelet: 254 10*3/uL (ref 150–440)
RBC: 4.7 10*6/uL (ref 3.80–5.20)
RDW: 14.2 % (ref 11.5–14.5)
WBC: 8 10*3/uL (ref 3.6–11.0)

## 2012-11-16 LAB — BASIC METABOLIC PANEL
Anion Gap: 7 (ref 7–16)
BUN: 28 mg/dL — ABNORMAL HIGH (ref 7–18)
Calcium, Total: 9.4 mg/dL (ref 8.5–10.1)
Chloride: 103 mmol/L (ref 98–107)
Co2: 28 mmol/L (ref 21–32)
Creatinine: 1.22 mg/dL (ref 0.60–1.30)
EGFR (African American): 55 — ABNORMAL LOW
EGFR (Non-African Amer.): 47 — ABNORMAL LOW
Glucose: 96 mg/dL (ref 65–99)
Osmolality: 281 (ref 275–301)
Potassium: 4.2 mmol/L (ref 3.5–5.1)
Sodium: 138 mmol/L (ref 136–145)

## 2012-11-18 IMAGING — CR DG UGI W/ KUB
2 series · 15 of 24 positions shown · non-contrast
Comparison: none

REASON FOR EXAM: dysphagia epigastric pain
COMMENTS:

[Series 1: run · 14 of 34 slices shown]
[im 1/34]
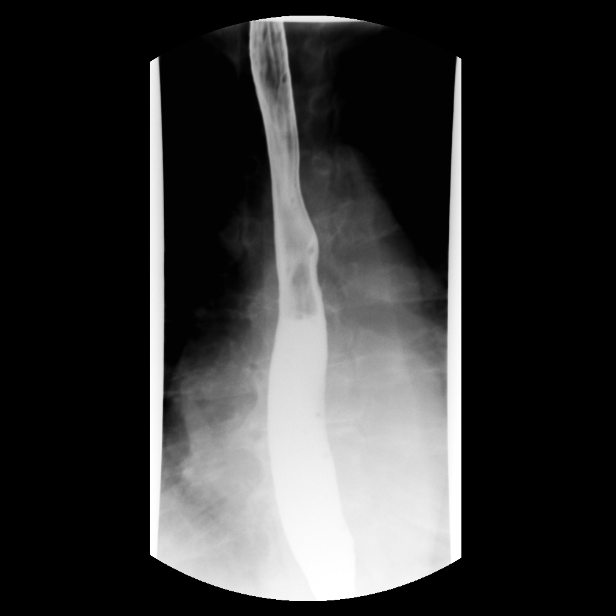
[im 4/34]
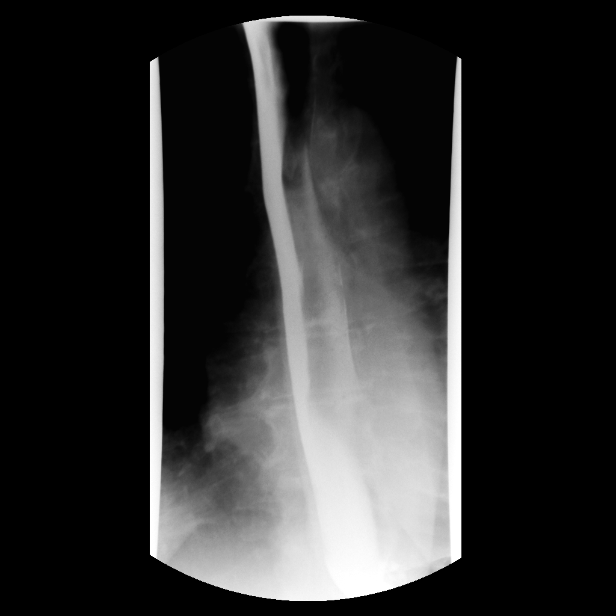
[im 7/34]
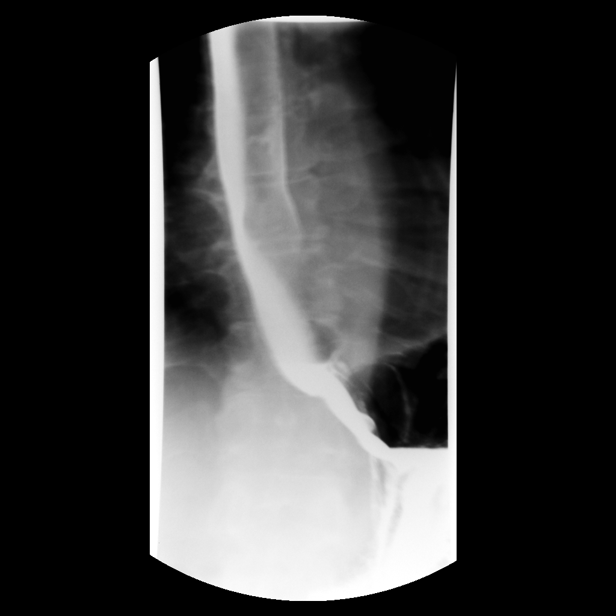
[im 8/34]
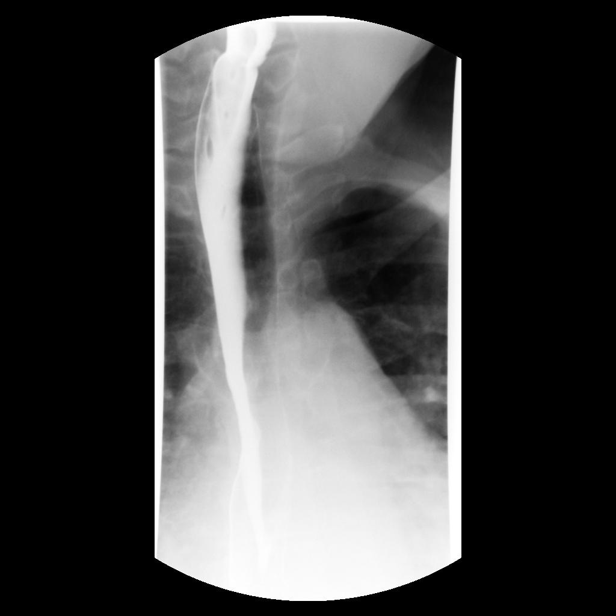
[im 11/34]
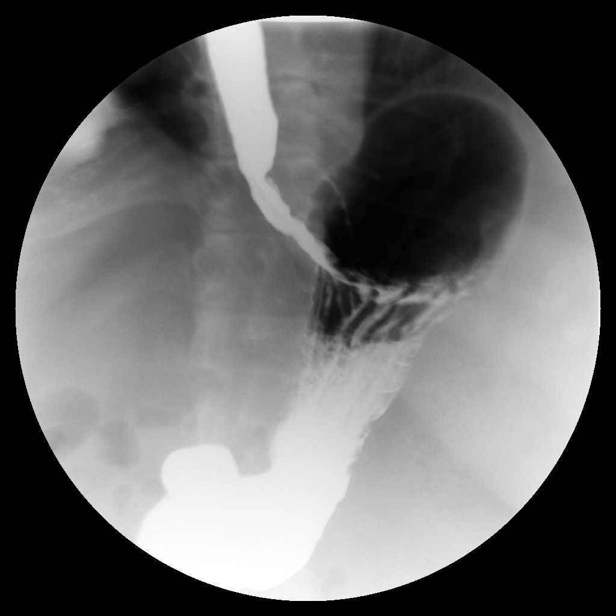
[im 13/34]
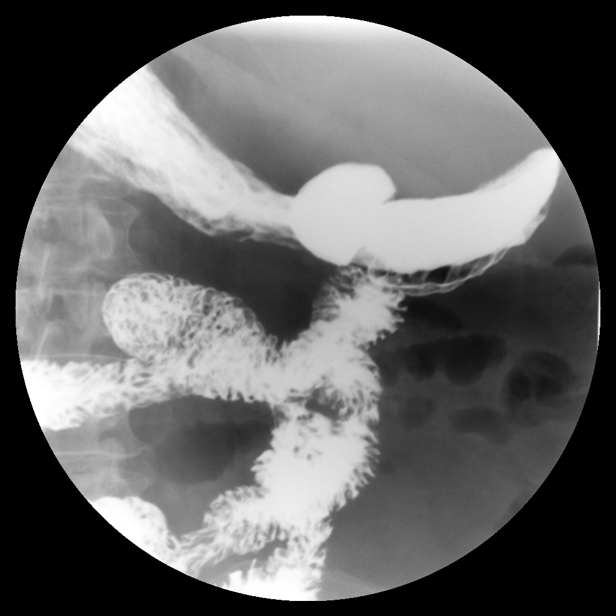
[im 16/34]
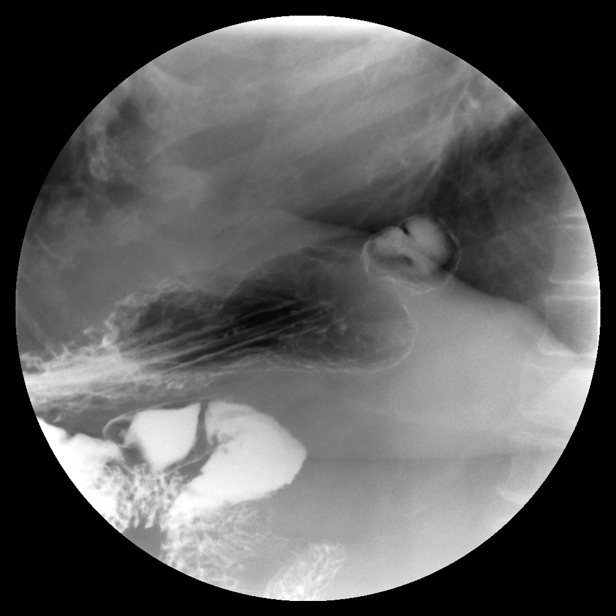
[im 19/34]
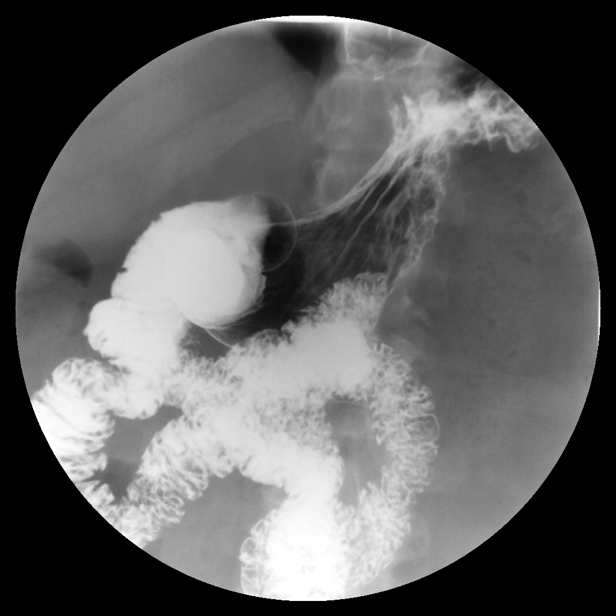
[im 20/34]
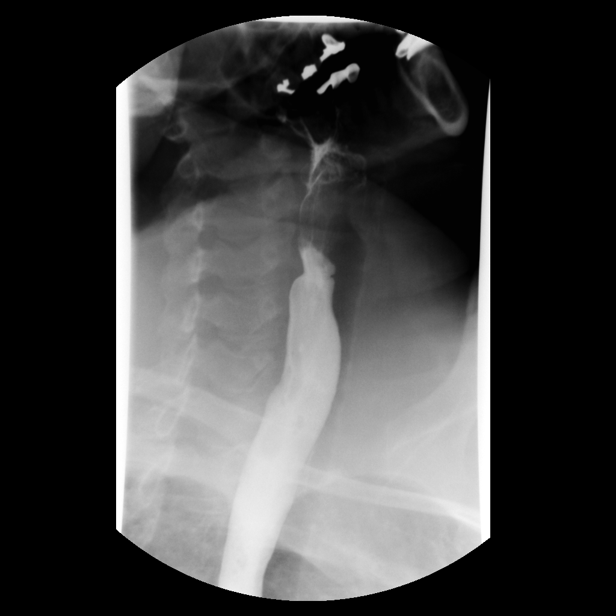
[im 23/34]
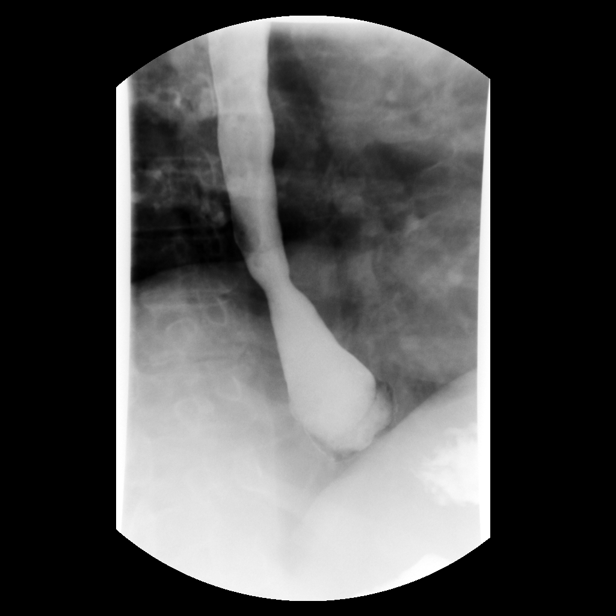
[im 25/34]
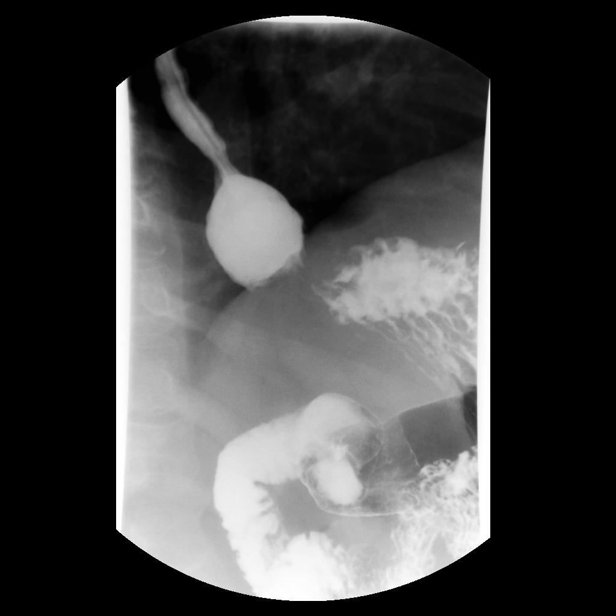
[im 28/34]
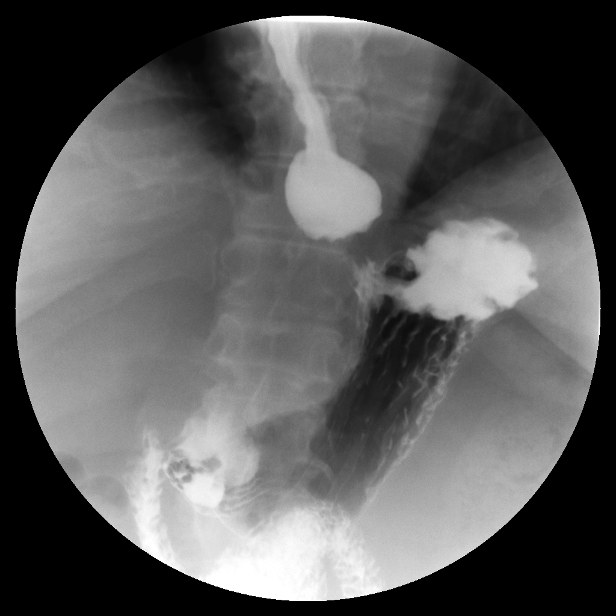
[im 31/34]
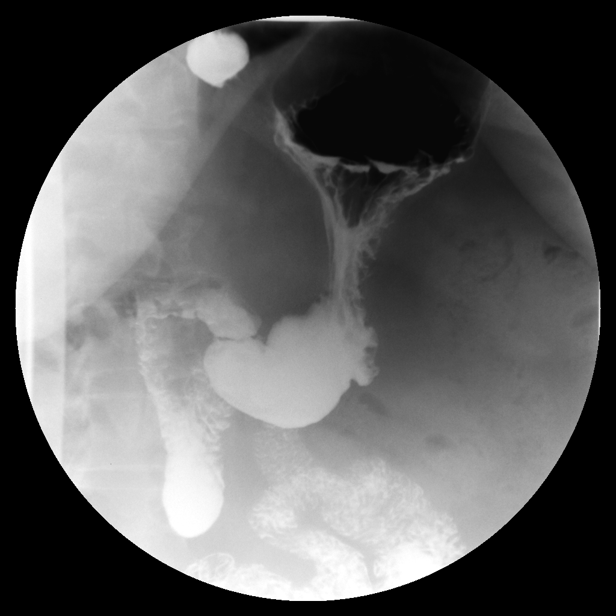
[im 32/34]
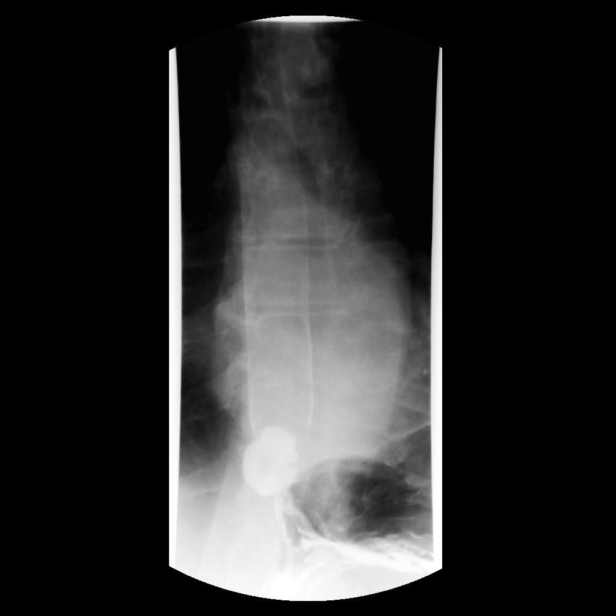

[view not recorded]
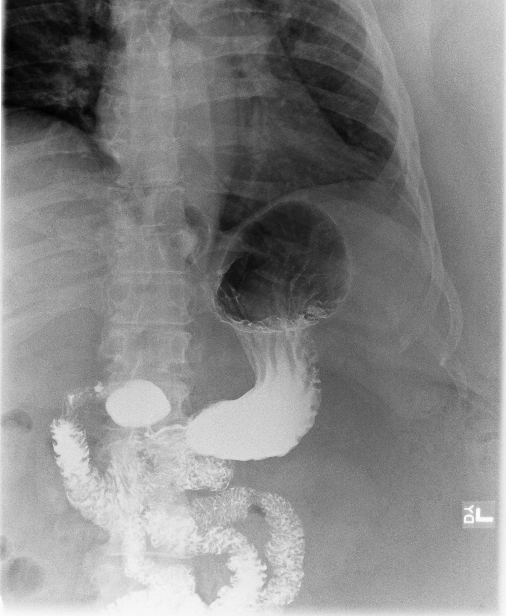

[15 of 24 positions shown; findings below may reference images not displayed]

PROCEDURE:     FL  - FL UPPER GI W/ BARIUM SWALLOW  - September 01, 2010  [DATE]

RESULT:     Upper GI with barium swallow is performed in the standard
fashion. The patient easily ingested the liquid barium. The esophagus
distends normally without evidence of a focal mucosal abnormality. A small
sliding-type hiatal hernia is present. Gastroesophageal reflux is
demonstrated into the proximal esophagus. A 12.5 mm barium-impregnated
tablet passes easily through the esophagus into the stomach. The patient had
difficulty retaining the CO2 distention in the stomach because of burping,
therefore, the mucosal surface is somewhat difficult to evaluate. No
definite gross abnormality is seen. The proximal small bowel is unremarkable.
IMPRESSION: 1. Small sliding-type hiatal hernia.
2. Gastroesophageal reflux.

## 2012-11-21 ENCOUNTER — Encounter: Payer: Self-pay | Admitting: Specialist

## 2012-11-22 ENCOUNTER — Inpatient Hospital Stay: Payer: Self-pay | Admitting: Bariatrics

## 2012-11-22 LAB — TROPONIN I: Troponin-I: <0.02 ng/mL

## 2012-11-23 LAB — CBC WITH DIFFERENTIAL/PLATELET
Basophil %: 0.3 %
Eosinophil #: 0.1 10*3/uL (ref 0.0–0.7)
Eosinophil %: 1.2 %
HCT: 38.9 % (ref 35.0–47.0)
HGB: 13 g/dL (ref 12.0–16.0)
Lymphocyte %: 8.4 %
MCV: 88 fL (ref 80–100)
Monocyte #: 0.5 x10 3/mm (ref 0.2–0.9)
Monocyte %: 5.9 %
Neutrophil #: 7.1 10*3/uL — ABNORMAL HIGH (ref 1.4–6.5)
Platelet: 278 10*3/uL (ref 150–440)
RBC: 4.4 10*6/uL (ref 3.80–5.20)
RDW: 14.6 % — ABNORMAL HIGH (ref 11.5–14.5)
WBC: 8.5 10*3/uL (ref 3.6–11.0)

## 2012-11-23 LAB — BASIC METABOLIC PANEL
BUN: 24 mg/dL — ABNORMAL HIGH (ref 7–18)
Chloride: 106 mmol/L (ref 98–107)
EGFR (African American): >60
Osmolality: 284 (ref 275–301)
Potassium: 3.7 mmol/L (ref 3.5–5.1)

## 2012-11-23 LAB — MAGNESIUM: Magnesium: 2 mg/dL

## 2012-11-24 LAB — PATHOLOGY REPORT

## 2012-12-05 IMAGING — US ABDOMEN ULTRASOUND LIMITED
1 series · 17 of 25 positions shown · non-contrast
Comparison: none

REASON FOR EXAM: epigastric pain
COMMENTS:

[Series 1: abdomen ultrasound limited · 17 of 73 slices shown]
[im 1/73]
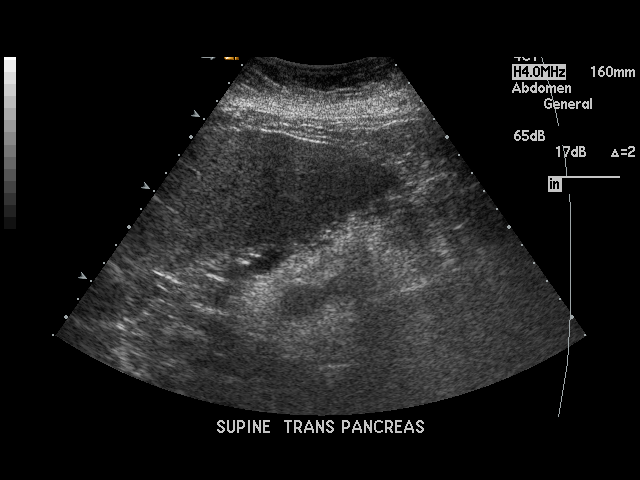
[im 7/73]
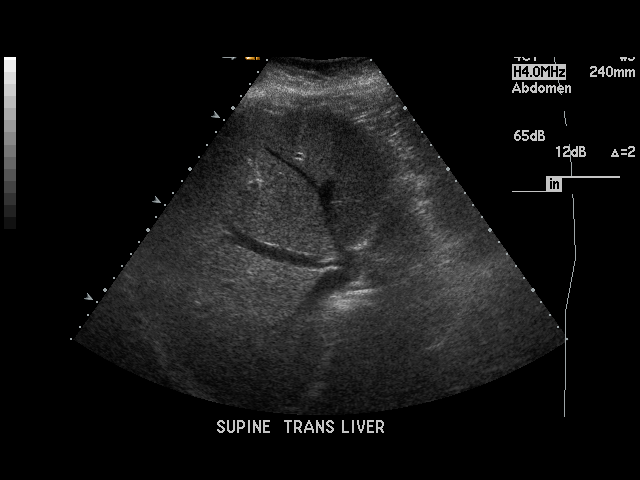
[im 10/73]
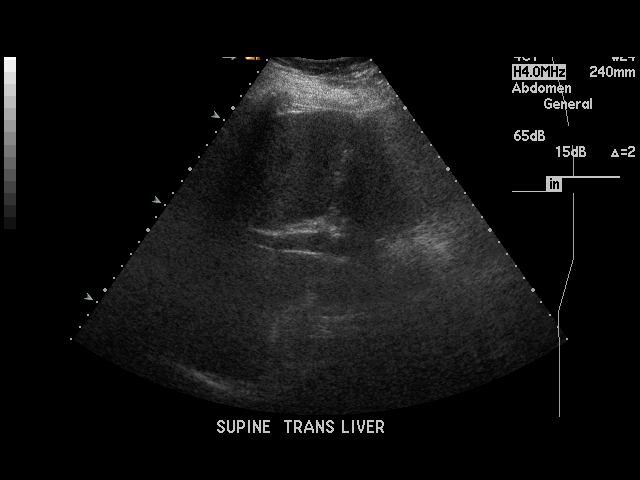
[im 16/73]
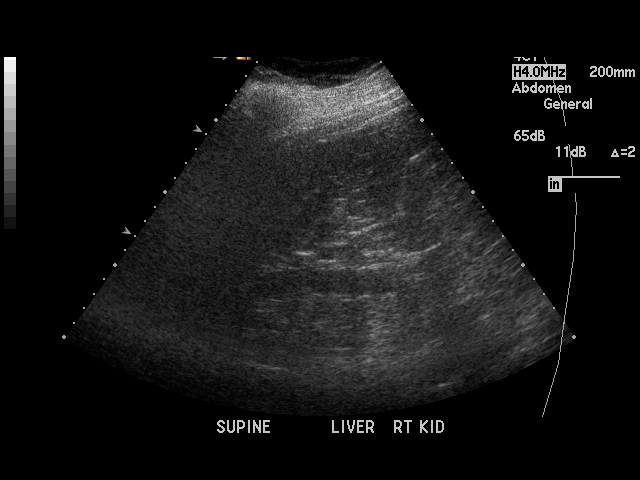
[im 19/73]
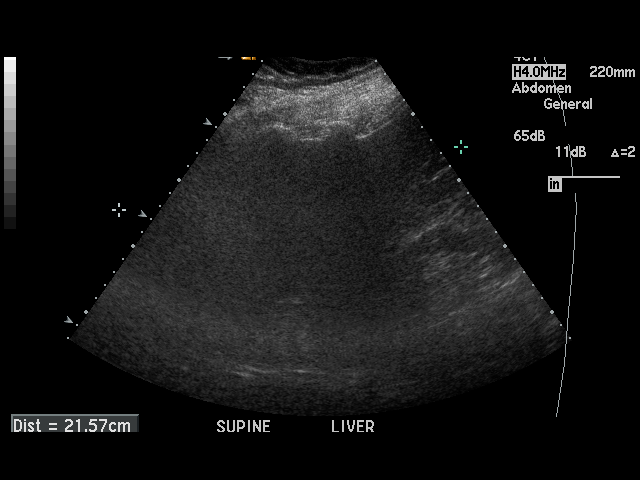
[im 25/73]
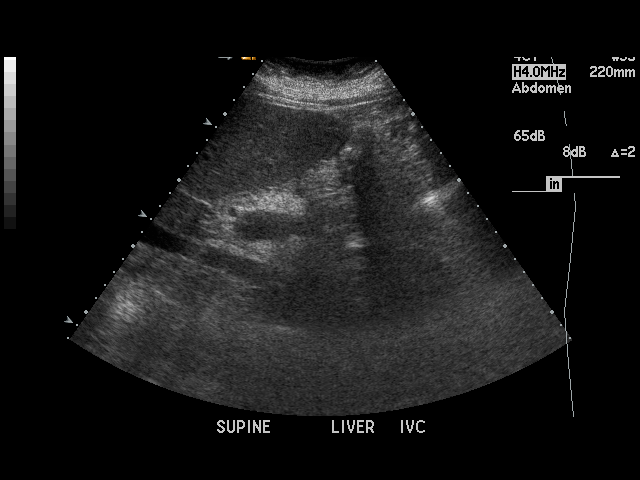
[im 28/73]
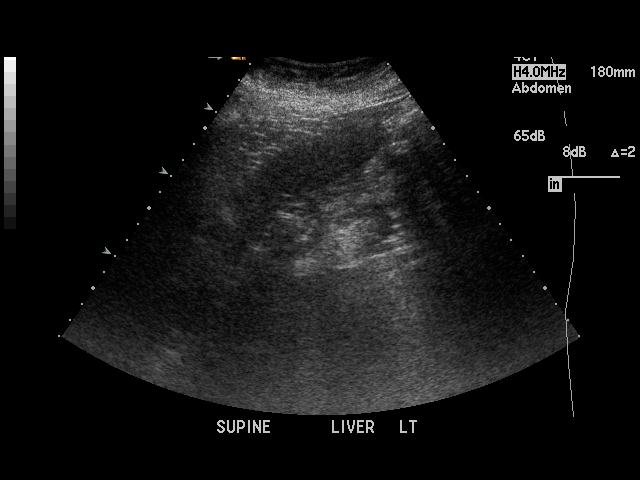
[im 34/73]
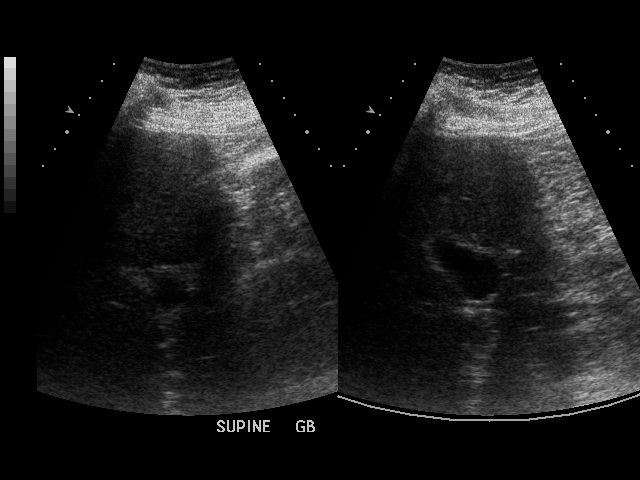
[im 37/73]
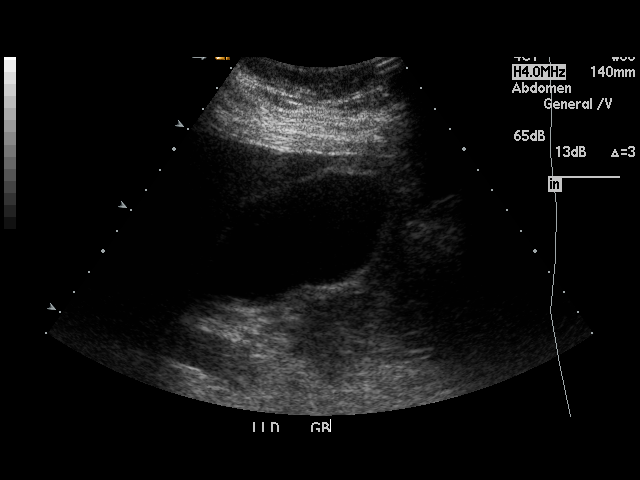
[im 40/73]
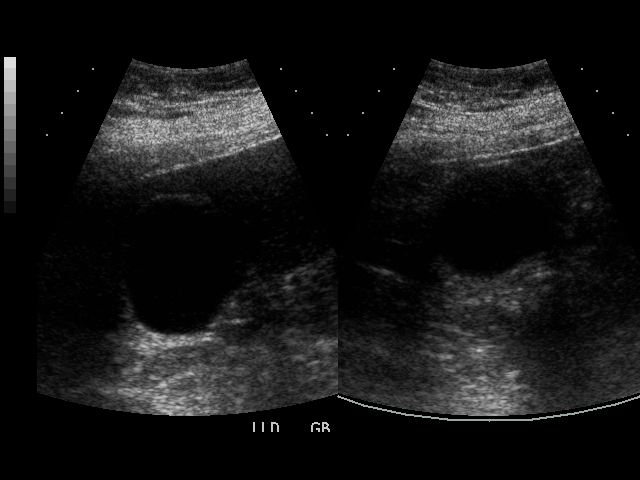
[im 46/73]
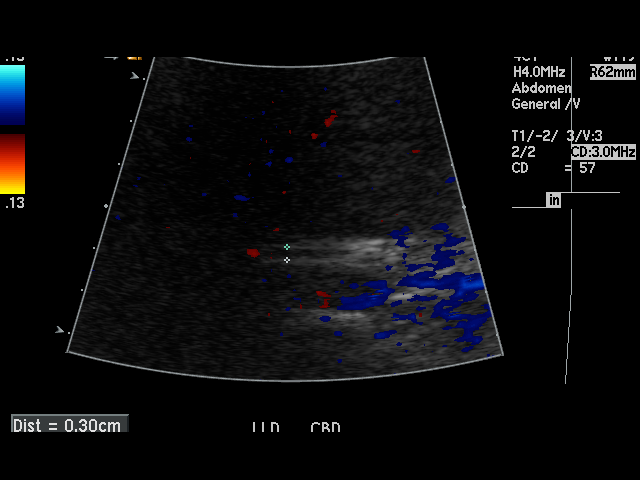
[im 49/73]
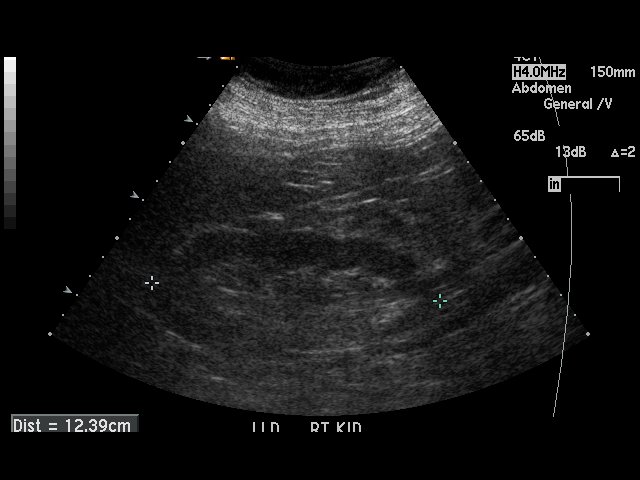
[im 55/73]
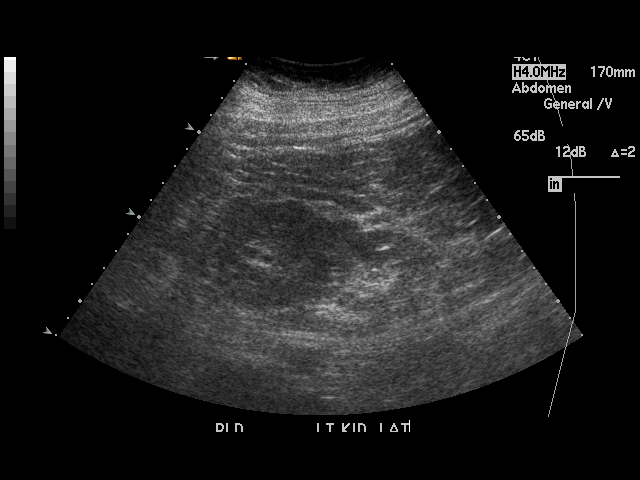
[im 58/73]
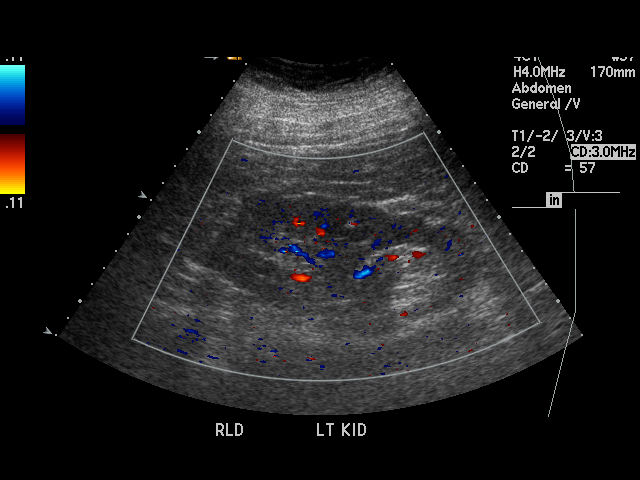
[im 64/73]
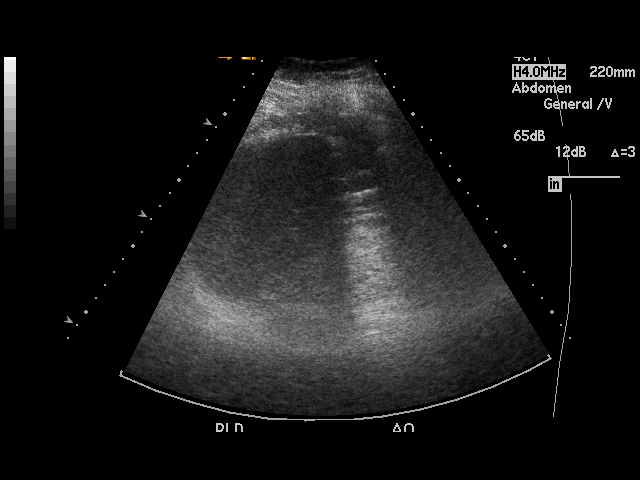
[im 67/73]
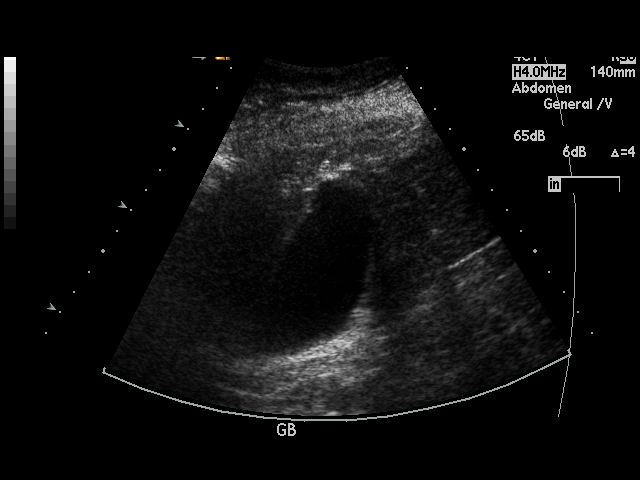
[im 73/73]
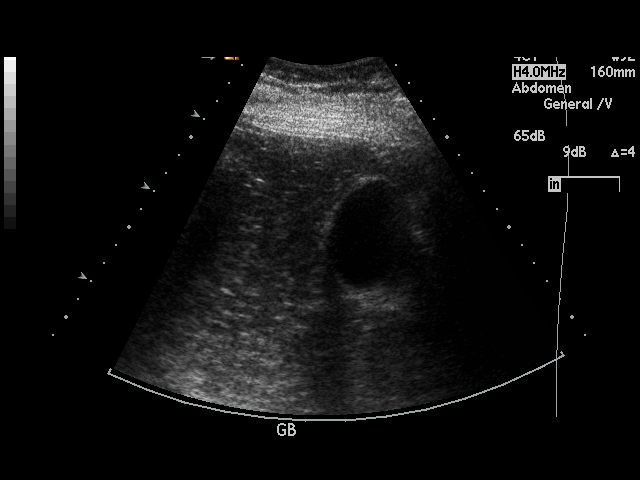

[17 of 25 positions shown; findings below may reference images not displayed]

PROCEDURE:     BAINES - BAINES ABDOMEN UPPER GENERAL  - September 18, 2010 [DATE]

RESULT:     The liver appears hyperechogenic, suspicious for fatty
infiltration. No focal hepatic mass lesions are seen. The pancreas is
obscured by bowel gas and is not visualized adequately for evaluation. The
abdominal aorta and inferior vena cava are partially obscured by the bowel
gas. The visualized portion shows no significant abnormalities. Spleen size
is within normal limits. No gallstones are seen. There is no thickening of
the gallbladder wall. The common bile duct measures 3.7 mm in diameter which
is within normal limits. The kidneys show no hydronephrosis. There is no
ascites. Sagittally, the right kidney measures 12.39 cm and the left also
measures 12.39 cm.
IMPRESSION: 1. Probable fatty infiltration of the liver.
2. No gallstones are seen and no pericholecystic fluid is noted.
3. Common bile duct is normal in size.
4. The pancreas and portions of the abdominal aorta and inferior vena cava
are obscured by bowel gas.

## 2012-12-05 IMAGING — NM NUCLEAR MEDICINE HEPATOHBILIARY INCLUDE GB
2 series · 12 of 12 positions shown · non-contrast
Comparison: none

REASON FOR EXAM: epigastric pain
COMMENTS:

[Series 1000: gallbladder dynamic (results) · 4.80mm/px · 6 of 60 frames shown]
[frame 6/60]
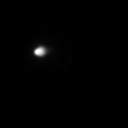
[frame 16/60]
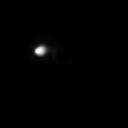
[frame 26/60]
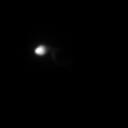
[frame 36/60]
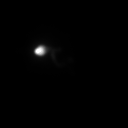
[frame 46/60]
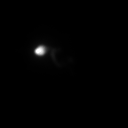
[frame 56/60]
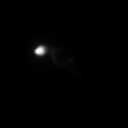

[Series 1000: gallbladder dynamic · 4.80mm/px · 6 of 60 frames shown]
[frame 6/60]
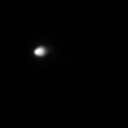
[frame 16/60]
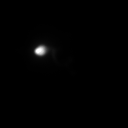
[frame 26/60]
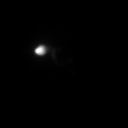
[frame 36/60]
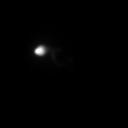
[frame 46/60]
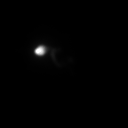
[frame 56/60]
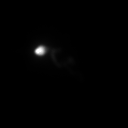

[12 of 12 positions shown; findings below may reference images not displayed]

PROCEDURE:     KNM - KNM HEPATO W/GB EJECT FRACTION  - September 18, 2010 [DATE]

RESULT:     Following intravenous administration of 7.83 mCi Uc-GGm
Choletec, there is noted prompt visualization of tracer activity in the
liver at 3 minutes. At 20 minutes, tracer activity is visualized in the
gallbladder and in the common duct. Tracer activity is observed in the small
bowel at 86 minutes. There is observed progressive clearance of tracer
activity from the liver throughout the examination.

The gallbladder ejection fraction measures 22% which is below the normal
range.
IMPRESSION: 1. Visualization of tracer activity in the small bowel is somewhat delayed
which is a nonspecific finding. There is prompt visualization of tracer
activity in the gallbladder and common duct.
2. The gallbladder ejection fraction measures 22% which is in the
hypokinetic range.

## 2012-12-14 ENCOUNTER — Ambulatory Visit: Payer: Self-pay | Admitting: Bariatrics

## 2012-12-21 ENCOUNTER — Encounter: Payer: Self-pay | Admitting: Specialist

## 2012-12-21 ENCOUNTER — Ambulatory Visit: Payer: Self-pay | Admitting: Bariatrics

## 2012-12-30 IMAGING — CT CT STONE STUDY
1 of 2 series · 15 of 32 positions shown, 19 images · non-contrast
Comparison: 11/08/2006, 12/14/2005

REASON FOR EXAM: right flank pain
COMMENTS:

PROCEDURE:     KCT - KCT ABDOMEN/PELVIS WO (STONE)  - October 13, 2010  [DATE]
RESULT:     Indication: Right flank pain
TECHNIQUE: Multiple axial images from the lung bases to the symphysis pubis
were obtained without oral and without intravenous contrast.

[Series 2: stonelg 3.0 i40s 3 · axial · 0.97mm/px · z∈[-517,-103]mm · 15 of 156 slices shown, 19 images]
[im 12/156  soft-tissue]
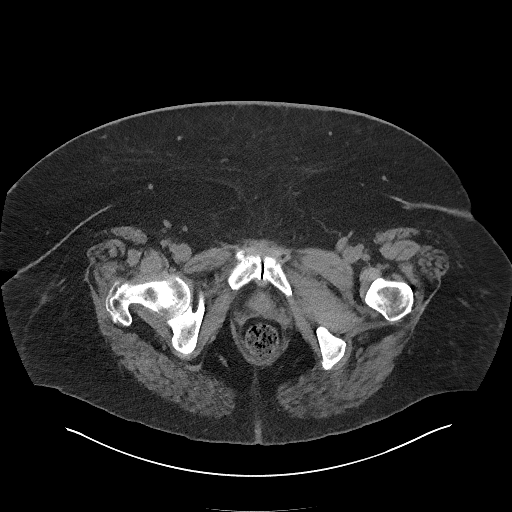
[im 12/156  bone]
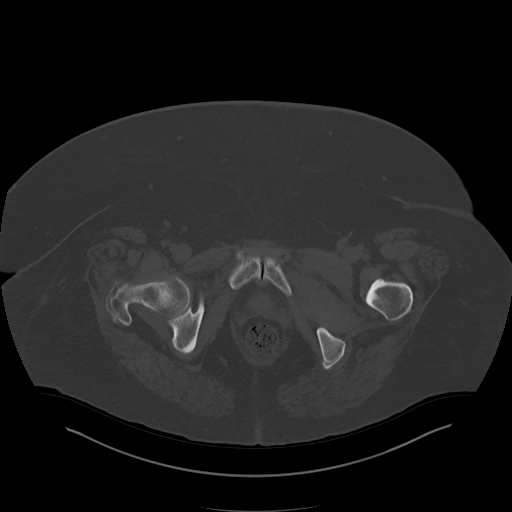
[im 23/156  soft-tissue]
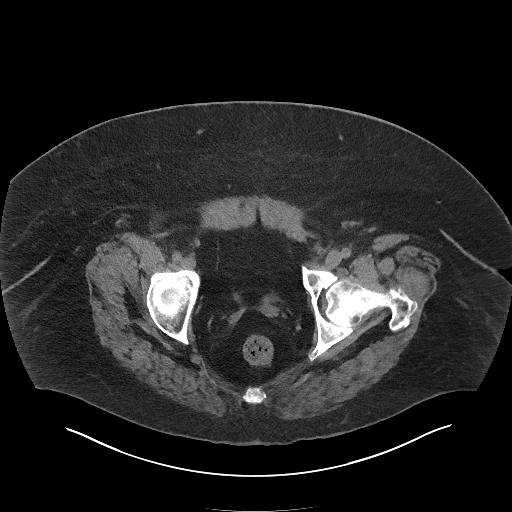
[im 35/156  soft-tissue]
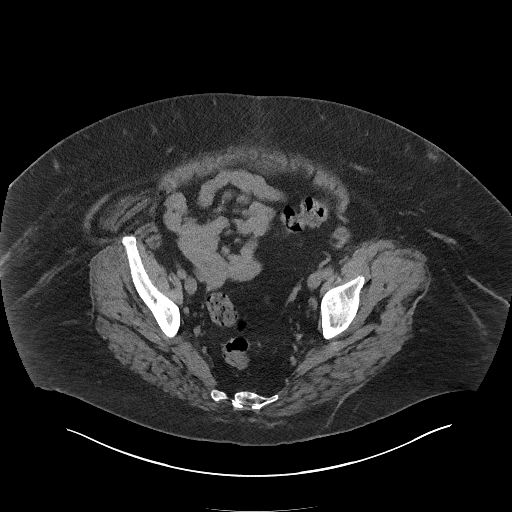
[im 46/156  soft-tissue]
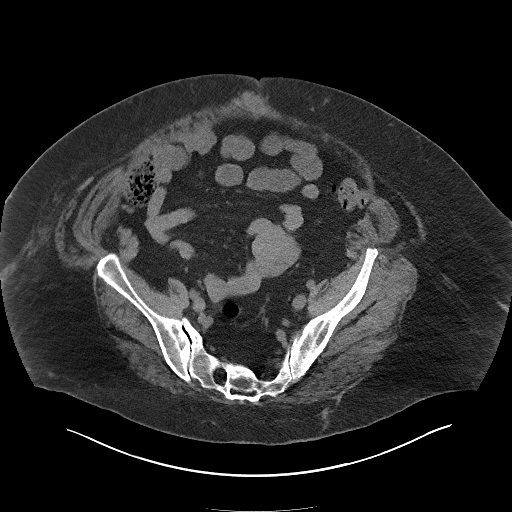
[im 58/156  soft-tissue]
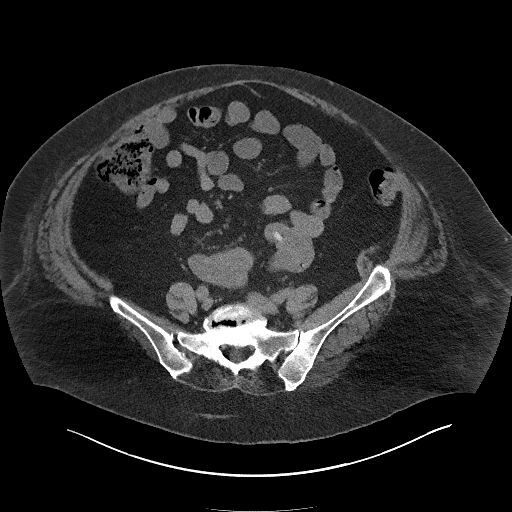
[im 69/156  soft-tissue]
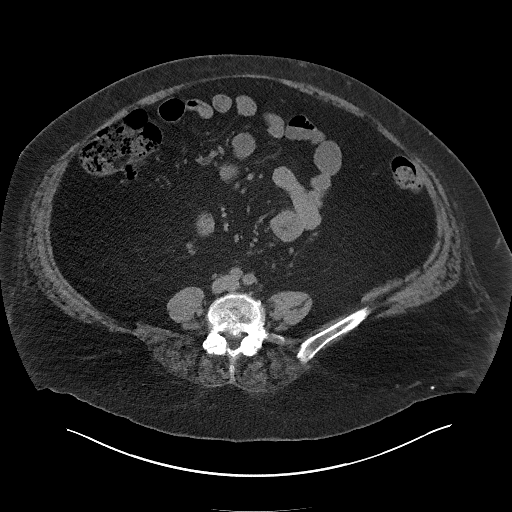
[im 81/156  soft-tissue]
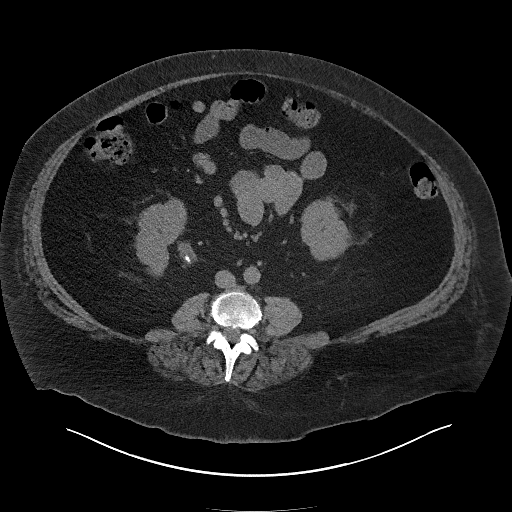
[im 92/156  soft-tissue]
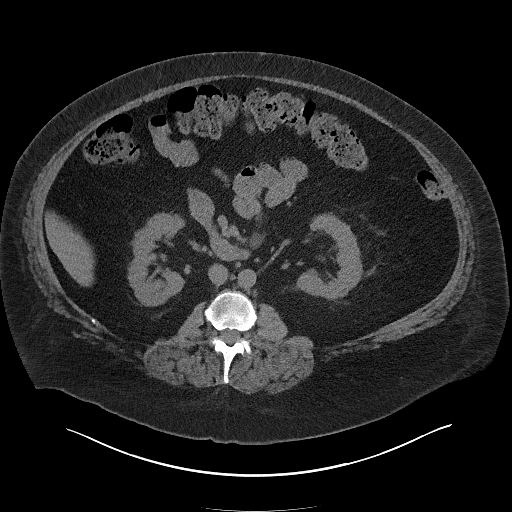
[im 104/156  soft-tissue]
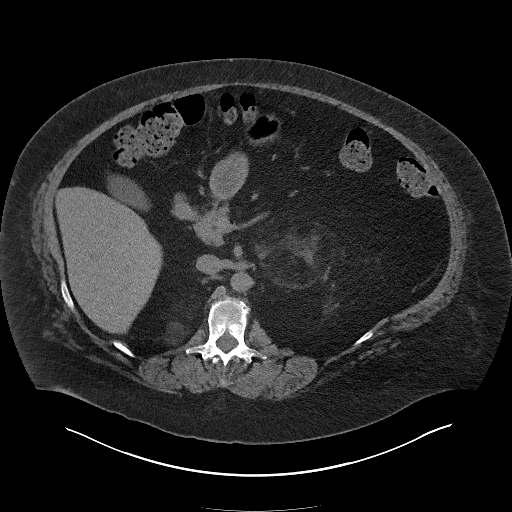
[im 104/156  bone]
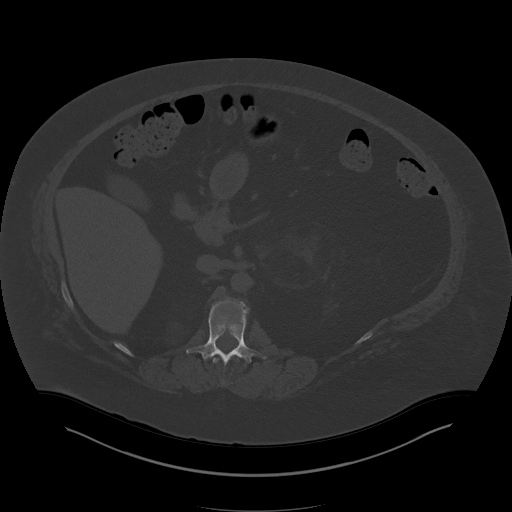
[im 115/156  soft-tissue]
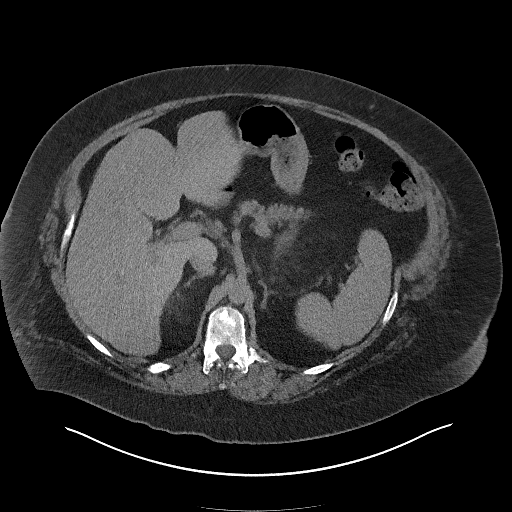
[im 127/156  soft-tissue]
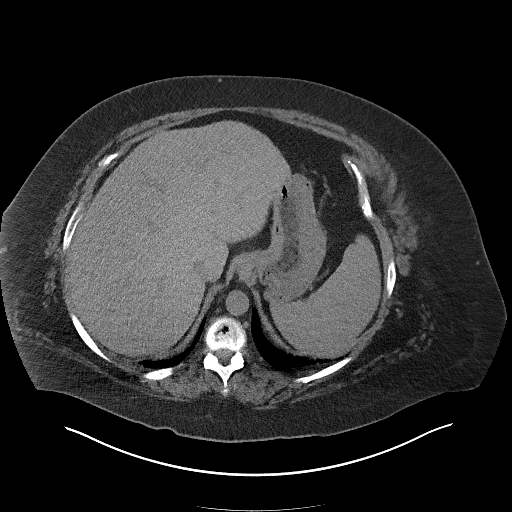
[im 133/156  lung]
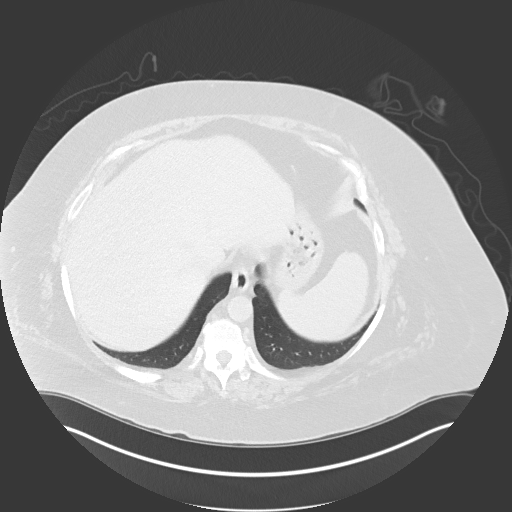
[im 138/156  soft-tissue]
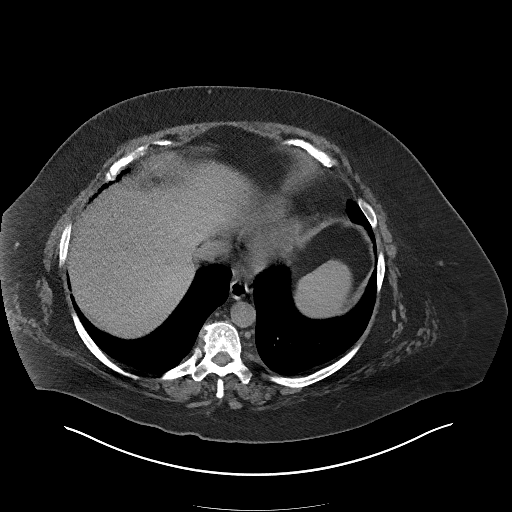
[im 138/156  lung]
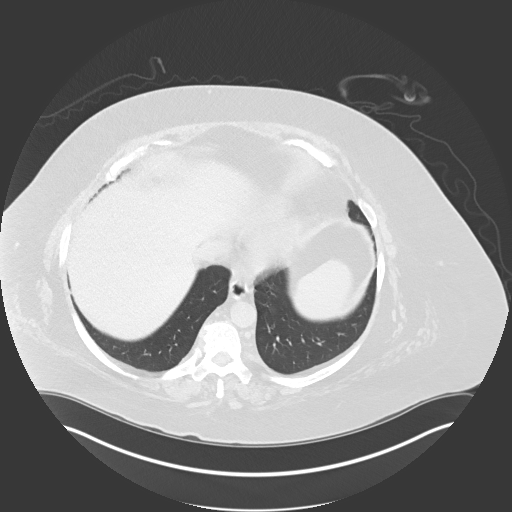
[im 144/156  lung]
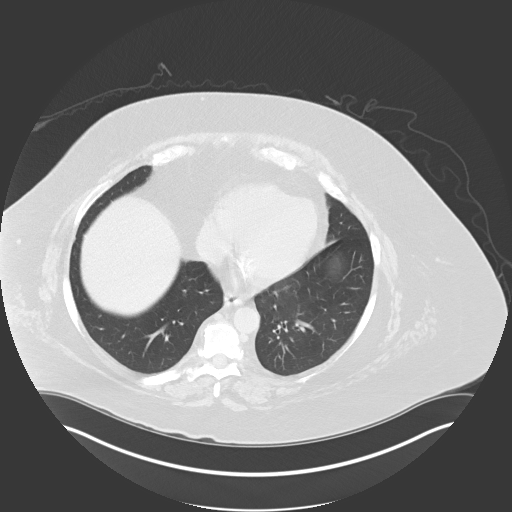
[im 150/156  soft-tissue]
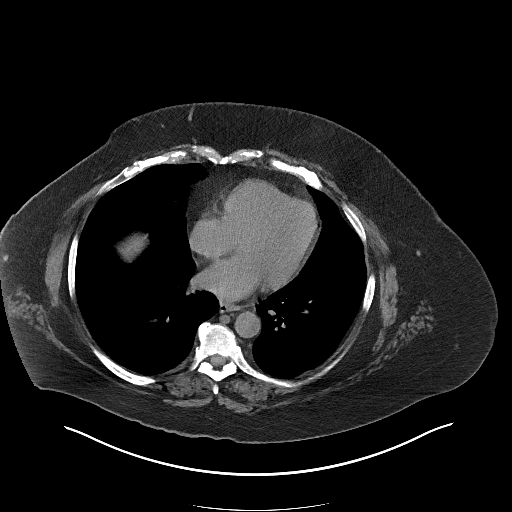
[im 150/156  lung]
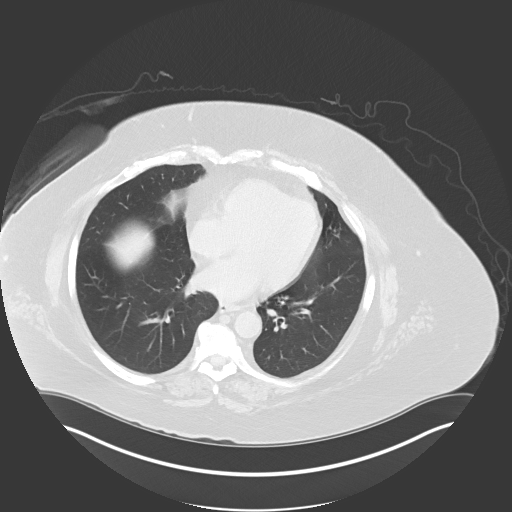

[15 of 32 positions shown; findings below may reference images not displayed]

FINDINGS: The lung bases are clear. There is no pleural or pericardial effusions.

There is a 8mm right UPJ calculus. No obstructive uropathy. No perinephric
stranding is seen. The kidneys are symmetric in size without evidence for
exophytic mass. The bladder is unremarkable.

The liver demonstrates no focal abnormality. The gallbladder is
unremarkable. The spleen demonstrates no focal abnormality. The  pancreas is
normal.

There is a right adrenal mass measures 6.6 x 4.9 cm with macroscopic fat
most consistent with a myolipoma. There is a 8.3 x 7.5 cm heterogeneous left
adrenal mass with macroscopic fat within it most consistent with a
myolipoma. The left adrenal mass is unchanged compared with 11/08/2006. The
right adrenal mass has significantly enlarged compared with the prior
examination at which time the mass measured 4.4 cm.

The unopacified stomach, duodenum, small intestine, and large intestine are
unremarkable, but evaluation is limited by lack of oral contrast.  There is
no pneumoperitoneum, pneumatosis, or portal venous gas. There is no
abdominal or pelvic free fluid. There is no lymphadenopathy.

The abdominal aorta is normal in caliber with atherosclerosis.

The osseous structures are unremarkable.
IMPRESSION: 1. There is an 8mm right UPJ calculus.

2. Bilateral adrenal masses with macroscopic fat within them. There has been
mild interval enlargement of the right adrenal mass. The masses are most
consistent with myolipoma is.

## 2013-01-19 ENCOUNTER — Ambulatory Visit: Payer: Self-pay | Admitting: Pain Medicine

## 2013-01-21 ENCOUNTER — Encounter: Payer: Self-pay | Admitting: Specialist

## 2013-01-23 ENCOUNTER — Encounter: Payer: Self-pay | Admitting: Surgery

## 2013-02-07 ENCOUNTER — Ambulatory Visit: Payer: Self-pay

## 2013-02-09 ENCOUNTER — Ambulatory Visit (INDEPENDENT_AMBULATORY_CARE_PROVIDER_SITE_OTHER): Payer: BC Managed Care – PPO | Admitting: Podiatrist

## 2013-02-09 ENCOUNTER — Encounter: Payer: Self-pay | Admitting: Podiatrist

## 2013-02-09 VITALS — BP 152/83 | HR 78 | Resp 16 | Ht 65.0 in | Wt 260.0 lb

## 2013-02-09 DIAGNOSIS — E1149 Type 2 diabetes mellitus with other diabetic neurological complication: Secondary | ICD-10-CM

## 2013-02-09 DIAGNOSIS — L97509 Non-pressure chronic ulcer of other part of unspecified foot with unspecified severity: Secondary | ICD-10-CM

## 2013-02-09 DIAGNOSIS — M2040 Other hammer toe(s) (acquired), unspecified foot: Secondary | ICD-10-CM

## 2013-02-09 DIAGNOSIS — Q6650 Congenital pes planus, unspecified foot: Secondary | ICD-10-CM

## 2013-02-09 DIAGNOSIS — M2030 Hallux varus (acquired), unspecified foot: Secondary | ICD-10-CM

## 2013-02-09 DIAGNOSIS — M2032 Hallux varus (acquired), left foot: Secondary | ICD-10-CM

## 2013-02-09 NOTE — Patient Instructions (Signed)
[facility] in [place]-- call for an appointment to get fitted for your diabetic shoes and inserts.  Call if any problems or concerns arise.

## 2013-02-09 NOTE — Progress Notes (Signed)
Subjective: [name] presents today for diabetic foot evaluation and for evaluation of right third toe and right hallux. She states she's lost 60 pounds after a gastric surgery and that she's been working out by walking on the treadmill about 4 miles at a time. She states her diabetic shoes and inserts are about 63 years old and she wonders if she needs another pair. Objective: Neurovascular status is unchanged with palpable pedal pulses DP and PT bilateral and profound peripheral neuropathy noted bilateral especially distal digits. 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 The right foot has more rectus appearance of digits however contracture deformity is present. Very thin foot type with fat pad atrophy is noted and pes planovalgus deformity is also present bilateral. Of note there is a stage I pressure ulcer on the distal lateral tip of the right third toe presumably from friction of her shoe gear. No other ulcerations are present especially on this left foot where in the past she's had multiple ulcerations which have required surgical offloading to heal. Assessment: Diabetes with neuropathy, ulceration, hammertoe contracture, hallux malleus, pes planus Plan: Discussed the ulceration on the right third toe debrided the area and applied Iodosorb and a Band-Aid. Instructed her to keep an eye on this area and dispensed a pad for offloading under the toes 2 and 3 right foot. Also wrote a prescription for some diabetic [facility] to [facility] [place]. Gave her a prescription for the shoes and she will call if she has any trouble obtaining them. Otherwise she'll be seen back for recheck as needed.  Marlowe Aschoff DPM

## 2013-02-20 ENCOUNTER — Encounter: Payer: Self-pay | Admitting: Specialist

## 2013-05-12 ENCOUNTER — Ambulatory Visit: Payer: Self-pay | Admitting: Pain Medicine

## 2013-07-25 ENCOUNTER — Encounter: Payer: Self-pay | Admitting: *Deleted

## 2013-07-26 ENCOUNTER — Ambulatory Visit (INDEPENDENT_AMBULATORY_CARE_PROVIDER_SITE_OTHER): Payer: BC Managed Care – PPO | Admitting: Cardiology

## 2013-07-26 ENCOUNTER — Encounter: Payer: Self-pay | Admitting: Cardiology

## 2013-07-26 ENCOUNTER — Encounter (INDEPENDENT_AMBULATORY_CARE_PROVIDER_SITE_OTHER): Payer: Self-pay

## 2013-07-26 VITALS — BP 138/82 | HR 79 | Ht 65.0 in | Wt 256.2 lb

## 2013-07-26 DIAGNOSIS — R011 Cardiac murmur, unspecified: Secondary | ICD-10-CM

## 2013-07-26 DIAGNOSIS — L74519 Primary focal hyperhidrosis, unspecified: Secondary | ICD-10-CM

## 2013-07-26 DIAGNOSIS — I10 Essential (primary) hypertension: Secondary | ICD-10-CM

## 2013-07-26 DIAGNOSIS — I4891 Unspecified atrial fibrillation: Secondary | ICD-10-CM

## 2013-07-26 DIAGNOSIS — R61 Generalized hyperhidrosis: Secondary | ICD-10-CM

## 2013-07-26 NOTE — Patient Instructions (Signed)
Your physician has recommended that you wear an event monitor for 1 week. Event monitors are medical devices that record the heart's electrical activity. Doctors most often [place] these monitors to diagnose arrhythmias. Arrhythmias are problems with the speed or rhythm of the heartbeat. The monitor is a small, portable device. You can wear one while you do your normal daily activities. This is usually used to diagnose what is causing palpitations/syncope (passing out).  Please call if they do not call about your monitor within a week.    Your physician has requested that you have an echocardiogram. Echocardiography is a painless test that uses sound waves to create images of your heart. It provides your doctor with information about the size and shape of your heart and how well your heart's chambers and valves are working. This procedure takes approximately one hour. There are no restrictions for this procedure.   Your physician recommends that you schedule a follow-up appointment in:  1 month

## 2013-07-27 ENCOUNTER — Encounter: Payer: Self-pay | Admitting: Cardiology

## 2013-07-27 DIAGNOSIS — R61 Generalized hyperhidrosis: Secondary | ICD-10-CM | POA: Insufficient documentation

## 2013-07-27 DIAGNOSIS — I10 Essential (primary) hypertension: Secondary | ICD-10-CM | POA: Insufficient documentation

## 2013-07-27 DIAGNOSIS — R011 Cardiac murmur, unspecified: Secondary | ICD-10-CM | POA: Insufficient documentation

## 2013-07-27 DIAGNOSIS — I480 Paroxysmal atrial fibrillation: Secondary | ICD-10-CM | POA: Insufficient documentation

## 2013-07-27 NOTE — Assessment & Plan Note (Signed)
At best I can see any potential cardiac etiology from her history, and the only medication that I can see that has this as a side effect is Cymbalta, which she will not stop it is not listed as a side effect of Pradaxa. To evaluate for possible abnormal cardiac etiology we'll check a 2-D echocardiogram just to make sure the cardiac structure and function is okay I will also have her wear monitor for a week just to ensure that the spells of sweating are not related to paroxysms of atrial fibrillation.

## 2013-07-27 NOTE — Assessment & Plan Note (Addendum)
She is unclear of what her symptoms are with this. Just to confirm that her sweating spells are not related to a her atrial fibrillation, I will have her wear monitor for a week to see if there is any association. With long-standing A. fib, we'll check echocardiogram to assess LV function and size as well as left atrial size. She is stable on Pradaxa for anticoagulation. Not currently on any rate or rhythm control, it does not seem to be symptomatic. We'll not make any adjustments, she proved to have paroxysms.

## 2013-07-27 NOTE — Assessment & Plan Note (Signed)
I did not do this on exam, it was listed as prior history. Basal affect her exam is limited by her body habitus and echocardiogram will help to see if is any structural or valvular abnormalities.

## 2013-07-27 NOTE — Assessment & Plan Note (Signed)
Controlled on ACE inhibitor

## 2013-07-27 NOTE — Progress Notes (Signed)
PATIENT: Cornelious Diven MRN: [id number] DOB: 12/06/1950 PCP: Gillian Scarce, MD  Clinic Note: Chief Complaint  Patient presents with  . OTHER    Ref by Defrancesco c/o uncontrolled sweating. Meds reviewed verbally with pt.    HPI: Keymani Glynn is a 63 y.o. female with a PMH below who presents today for consultation to discuss cause of excessive sweating. She has a long-standing history of what sounds like paroxysmal atrial fibrillation. She is not on any rate or rhythm control agents, but is anticoagulated with Robaxin. She really is unaware of what symptoms she would have from atrial fibrillation. The main point for referral is to decide if there is any potential cardiac or cardiac medication related cause for her uncontrolled swelling.  She is a very pleasant morbidly obese woman who is status post a sleeve gastrectomy back in September 2014 weighted roughly 50 pound weight loss (however from the beginning of the initial evaluation is close to her pounds down). She had initially come off of all her IVs medications, but restarted metformin in the wintertime. She is significant diabetic peripheral neuropathy that is decently controlled with Cymbalta.  She relates that she has been under a lot of social stress at home with difficulties with her spouse. He had a dramatic change in personality after the death of his mother and I think sister. Since then he "has not been the same man she married ". She indicates it is somewhat verbally abusive and very stubborn and unyielding. He is also quite jealous, suggesting that she is trying to lose weight or exercising in order to impress another man.  Over last few weeks she has started noticing "short spells"where she will break out into a profuse sweat despite doing much of anything.  She says this was just as if she didn't come back inside from working in the garden. The spells last several minutes, but not necessarily associated with any sensation  of palpitations or irregular heartbeats. No real dyspnea or chest discomfort associated with them. She will have maybe 4-5 episodes a week. She denies any dizziness or syncope/near-syncope associated with them. She does have intermittent episodes of strange sensations in her chest that may be palpitations or tightness that are not usually with exertion. She actually exercises routinely now several days a week and denies any chest tightness or pressure associated with it. She does have mild exertional dyspnea along with intermittent episodes of resting chest discomfort. She has sleep apnea and uses oxygen plus CPAP at night. Otherwise no PND, orthopnea or or significant edema. She does have venous stasis changes in bilateral lower extremities with the left exiting grade than the right but minimal edema it is usually more on the right. She easily controlled with when necessary diuretic is not listed.  She really does not notice sensation of atrial fibrillation, stating that she was diagnosed about 8 years ago and has been on anticoagulation since. She denies any melena, hematochezia, hematuria, epistaxis or significant bruising. She has peripheral neuropathy the pain in her feet but no claudication.  Past Medical History  Diagnosis Date  . Sinus problem   . Swelling   . Thyroid disease   . Diabetes   . Venous stasis dermatitis of both lower extremities   . Hx of blood clots   . HBP (high blood pressure)   . Kidney disease   . Depression   . Dysthymic disorder   . Paroxysmal atrial fibrillation 2007    On axilla  . Asthma   .  GERD (gastroesophageal reflux disease)   . Heart murmur     No echocardiogram    Prior Cardiac Evaluation and Past Surgical History: Past Surgical History  Procedure Laterality Date  . Hernia repair    . Ankle surgery Right   . Vaginal hysterectomy    . Sleeve gastroplasty    . Lithotripsy    . Kidney surgery      Allergies  Allergen Reactions  . Aspirin Hives   . Penicillins Hives  . Terramycin [Oxytetracycline] Hives    Current Outpatient Prescriptions  Medication Sig Dispense Refill  . ALPRAZolam (XANAX) 0.25 MG tablet Take 0.25 mg by mouth as needed.       Marland Kitchen buPROPion (WELLBUTRIN XL) 300 MG 24 hr tablet Take 300 mg by mouth daily.      . dabigatran (PRADAXA) 150 MG CAPS capsule Take 150 mg by mouth 2 (two) times daily.      . DULoxetine (CYMBALTA) 60 MG capsule Take 60 mg by mouth daily.      . Fluticasone-Salmeterol (ADVAIR) 250-50 MCG/DOSE AEPB Inhale 2 puffs into the lungs 2 (two) times daily.      Marland Kitchen levothyroxine (SYNTHROID, LEVOTHROID) 200 MCG tablet Take 200 mcg by mouth every other day.       . metFORMIN (GLUCOPHAGE) 1000 MG tablet Take 1,000 mg by mouth daily after breakfast.      . metFORMIN (GLUCOPHAGE) 500 MG tablet Take 500 mg by mouth daily at 10 pm.      . montelukast (SINGULAIR) 10 MG tablet Take 10 mg by mouth at bedtime.      . quinapril (ACCUPRIL) 20 MG tablet Take 20 mg by mouth at bedtime.      Marland Kitchen zolpidem (AMBIEN CR) 12.5 MG CR tablet Take 12.5 mg by mouth at bedtime as needed for sleep.       No current facility-administered medications for this visit.    History   Social History Narrative   She is currently married -- for 28 years. Does not work. Does not smoke or take alcohol. She never smoked. She exercises at least 3 days a week since before her gastric surgery.   Marital status reviewed in history of present illness.    family history includes Skin cancer in her father.  ROS: A comprehensive Review of Systems - Negative except Symptoms noted in history of present illness. Additionally she has intermittent abdominal discomfort since her surgery that is rare she denies any problems with her diet, but occasionally has changes in bowel movements. No flushing or rashes associated with eating  PHYSICAL EXAM BP 138/82  Pulse 79  Ht 5\' 5"  (1.651 m)  Wt 256 lb 4 oz (116.234 kg)  BMI 42.64 kg/m2 General appearance:  alert, cooperative, appears stated age, no distress, moderately obese and A pleasant mood with normal affect, but easily moved to tears but certainty she discusses. Answers questions appropriately. Neck: no adenopathy, no carotid bruit, no JVD and supple, symmetrical, trachea midline Lungs: clear to auscultation bilaterally, normal percussion bilaterally and Nonlabored, good air movement Heart: Overall quite distant heart sounds, sounds like normal S1 and S2. I do not hear a significant murmur although there may be a soft systolic murmur heard at the right external border. No rubs or gallops heard. Unable to palpate PMI due to body habitus. Abdomen: Obese but otherwise soft/NT/M.D./NA BS. No HSM palpable due to obesity there Extremities: edema trace (right greater than left), no ulcers, gangrene; varicose veins noted and venous stasis dermatitis  noted Pulses: 2+ and symmetric Neurologic: Alert and oriented X 3, normal strength and tone. Normal symmetric reflexes. Normal coordination and gait   Adult ECG Report  Rate: 79 ;  Rhythm: This appears to be at sinus rhythm but somewhat T waves look inferior biphasic suggesting possible ectopic atrial rhythm; with occasional PACs and PVCs  QRS Axis: 50 ;  PR Interval: 160 msec ;  QRS Duration: 90 msec ; QTc: 413 msec;  Voltages: Normal  Conduction Disturbances: none  Other Abnormalities: Atypicals/left normal T-wave axis suggesting possible ectopic atrial rhythm versus sinus rhythm   Narrative Interpretation: Relatively normal EKG  Recent Labs: None  ASSESSMENT / PLAN: Very pleasant 63 year old morbidly obese woman with a long-standing history of paroxysmal A. fib (mostly asymptomatic) who is referred for hyperhidrosis.   Hyperhidrosis - unclear etiology At best I can see any potential cardiac etiology from her history, and the only medication that I can see that has this as a side effect is Cymbalta, which she will not stop it is not listed as a side  effect of Pradaxa. To evaluate for possible abnormal cardiac etiology we'll check a 2-D echocardiogram just to make sure the cardiac structure and function is okay I will also have her wear monitor for a week just to ensure that the spells of sweating are not related to paroxysms of atrial fibrillation.   A-fib She is unclear of what her symptoms are with this. Just to confirm that her sweating spells are not related to a her atrial fibrillation, I will have her wear monitor for a week to see if there is any association. With long-standing A. fib, we'll check echocardiogram to assess LV function and size as well as left atrial size. She is stable on Pradaxa for anticoagulation. Not currently on any rate or rhythm control, it does not seem to be symptomatic. We'll not make any adjustments, she proved to have paroxysms.  Essential hypertension Controlled on ACE inhibitor  Heart murmur I did not do this on exam, it was listed as prior history. Basal affect her exam is limited by her body habitus and echocardiogram will help to see if is any structural or valvular abnormalities.   Orders Placed This Encounter  Procedures  . EKG 12-Lead    Order Specific Question:  Where should this test be performed    Answer:  [facility]  . Cardiac event monitor    Standing Status: Future     Number of Occurrences:      Standing Expiration Date: 07/26/2014    Scheduling Instructions:     1 week  . 2D Echocardiogram without contrast    Standing Status: Future     Number of Occurrences:      Standing Expiration Date: 07/26/2014    Order Specific Question:  Type of Echo    Answer:  Complete    Order Specific Question:  Where should this test be performed    Answer:  [facility]    Order Specific Question:  Reason for exam-Echo    Answer:  [facility]  427.31    Followup: Roughly 4-6 weeks if study is abnormal, otherwise when necessary    DAVID W. [name], M.D., M.S. Interventional  [facility]

## 2013-08-03 ENCOUNTER — Other Ambulatory Visit (INDEPENDENT_AMBULATORY_CARE_PROVIDER_SITE_OTHER): Payer: BC Managed Care – PPO

## 2013-08-03 ENCOUNTER — Other Ambulatory Visit: Payer: Self-pay

## 2013-08-03 DIAGNOSIS — R011 Cardiac murmur, unspecified: Secondary | ICD-10-CM

## 2013-08-03 DIAGNOSIS — I4891 Unspecified atrial fibrillation: Secondary | ICD-10-CM

## 2013-08-03 DIAGNOSIS — I059 Rheumatic mitral valve disease, unspecified: Secondary | ICD-10-CM

## 2013-08-03 HISTORY — PX: TRANSTHORACIC ECHOCARDIOGRAM: SHX275

## 2013-08-03 NOTE — Progress Notes (Signed)
Quick Note:  Overall heart function looks good.  There is a possible Vegetation - infection -- on one of the heart valves.   To better assess this - we need to do a more invasive Echocardiogram -- TEE. Also need to check blood cultures to evaluate for possible bacteria in the blood that could infect a valve.  [name], MD    ______

## 2013-08-04 ENCOUNTER — Telehealth: Payer: Self-pay | Admitting: *Deleted

## 2013-08-04 ENCOUNTER — Encounter: Payer: Self-pay | Admitting: *Deleted

## 2013-08-04 DIAGNOSIS — Z01812 Encounter for preprocedural laboratory examination: Secondary | ICD-10-CM

## 2013-08-04 NOTE — Telephone Encounter (Signed)
Overall heart function looks good.  There is a possible Vegetation - infection -- on one of the heart valves.   To better assess this - we need to do a more invasive Echocardiogram -- TEE. Also need to check blood cultures to evaluate for possible bacteria in the blood that could infect a valve.  [name], MD

## 2013-08-04 NOTE — Telephone Encounter (Signed)
Your physician has requested that you have a TEE. During a TEE, sound waves are used to create images of your heart. It provides your doctor with information about the size and shape of your heart and how well your heart's chambers and valves are working. In this test, a transducer is attached to the end of a flexible tube that's guided down your throat and into your esophagus (the tube leading from you mouth to your stomach) to get a more detailed image of your heart. You are not awake for the procedure. Please see the instruction sheet given to you today. For further information please visit [email or web address].   Instructions and labs left at front desk for patient  Lab orders left at front desk for patient  Patient verbalized understanding

## 2013-08-06 DIAGNOSIS — I4891 Unspecified atrial fibrillation: Secondary | ICD-10-CM

## 2013-08-07 ENCOUNTER — Other Ambulatory Visit: Payer: Self-pay

## 2013-08-07 ENCOUNTER — Telehealth: Payer: Self-pay

## 2013-08-07 ENCOUNTER — Telehealth: Payer: Self-pay | Admitting: *Deleted

## 2013-08-07 LAB — CBC WITH DIFFERENTIAL/PLATELET
BASOS ABS: 0.1 10*3/uL (ref 0.0–0.1)
Basophil %: 1 %
Eosinophil #: 0.2 10*3/uL (ref 0.0–0.7)
Eosinophil %: 3.8 %
HCT: 38.7 % (ref 35.0–47.0)
HGB: 12.7 g/dL (ref 12.0–16.0)
Lymphocyte #: 1 10*3/uL (ref 1.0–3.6)
Lymphocyte %: 16 %
MCH: 29.8 pg (ref 26.0–34.0)
MCHC: 33 g/dL (ref 32.0–36.0)
MCV: 90 fL (ref 80–100)
MONO ABS: 0.4 x10 3/mm (ref 0.2–0.9)
MONOS PCT: 6.7 %
NEUTROS ABS: 4.7 10*3/uL (ref 1.4–6.5)
NEUTROS PCT: 72.5 %
PLATELETS: 233 10*3/uL (ref 150–440)
RBC: 4.28 10*6/uL (ref 3.80–5.20)
RDW: 14.7 % — ABNORMAL HIGH (ref 11.5–14.5)
WBC: 6.4 10*3/uL (ref 3.6–11.0)

## 2013-08-07 LAB — BASIC METABOLIC PANEL
Anion Gap: 5 — ABNORMAL LOW (ref 7–16)
BUN: 19 mg/dL — AB (ref 7–18)
CREATININE: 0.78 mg/dL (ref 0.60–1.30)
Calcium, Total: 8.6 mg/dL (ref 8.5–10.1)
Chloride: 106 mmol/L (ref 98–107)
Co2: 28 mmol/L (ref 21–32)
EGFR (Non-African Amer.): >60
Glucose: 174 mg/dL — ABNORMAL HIGH (ref 65–99)
Osmolality: 284 (ref 275–301)
POTASSIUM: 4 mmol/L (ref 3.5–5.1)
SODIUM: 139 mmol/L (ref 136–145)

## 2013-08-07 LAB — PROTIME-INR
INR: 1
Prothrombin Time: 13.1 secs (ref 11.5–14.7)

## 2013-08-07 NOTE — Telephone Encounter (Signed)
Pt called and wanted you to know she has a "really really bad gag reflex', also she has a repaird hiatal hearnia. Also, she asked what is done "if there is an infection in my heart".

## 2013-08-07 NOTE — Telephone Encounter (Signed)
[facility] called stating that pt is currently on abx prescribed by her PCP.  They will not be able to draw blood cultures on pt but will proceed w/ drawing other labs.

## 2013-08-07 NOTE — Telephone Encounter (Signed)
Patient just wanted to let Dr. [name] know she had a hiatal hernia repair in the past, she also has terrible "gag reflex" and is concerned this will interfere with her TEE. She would like to know if she has an infection in her heart, what will the treatment be?

## 2013-08-07 NOTE — Telephone Encounter (Signed)
Pt will have cultures drawn tomorrow after procure

## 2013-08-07 NOTE — Telephone Encounter (Signed)
Informed patient that per Dr. Rockey Situ her gag reflex will not be bothered by this procedure  Patient verbalized understanding

## 2013-08-07 NOTE — Telephone Encounter (Signed)
I still wanted cultures   [name], MD

## 2013-08-07 NOTE — Telephone Encounter (Signed)
Patient called and wants to know if start exercise class? And is paperwork for lab ready? Call patient please.

## 2013-08-07 NOTE — Telephone Encounter (Signed)
OK --  thnx

## 2013-08-08 ENCOUNTER — Ambulatory Visit: Payer: Self-pay | Admitting: Cardiovascular Disease

## 2013-08-08 DIAGNOSIS — I059 Rheumatic mitral valve disease, unspecified: Secondary | ICD-10-CM

## 2013-08-08 HISTORY — PX: TRANSESOPHAGEAL ECHOCARDIOGRAM: SHX273

## 2013-08-09 ENCOUNTER — Encounter: Payer: Self-pay | Admitting: Cardiovascular Disease

## 2013-08-09 NOTE — Progress Notes (Signed)
Quick Note:  TEE done 5/19 - looked pretty good.  There was concern about something seen on 2D Echo - involving the Mitral Valve. This did not appear to look like a "vegetation"/infectoin -- so probably not causing the sweating. I.e no Endocarditis. The valve has some minor irregularities -- mild regurgitation (probably normal for age) with mild calcifications.  Good News.  [name], MD   ______

## 2013-08-13 LAB — CULTURE, BLOOD (SINGLE)

## 2013-08-13 IMAGING — CR DG CHOLANGIOGRAM OPERATIVE
2 series · 15 of 20 positions shown · non-contrast
Comparison: none

REASON FOR EXAM: cholelithiasis
COMMENTS:

PROCEDURE:     DXR - DXR CHOLANGIOGRAM OP (INITIAL)  - July 28, 2011  [DATE]
RESULT:     Intra-Operative Cholangiogram reveals a widely patent common
bile duct. Contrast empties into the duodenum. Minimal prominence of the
common bile duct is noted.

[Series 6: cont. · 7 of 17 frames shown (1 of 2)]
[frame 1/17]
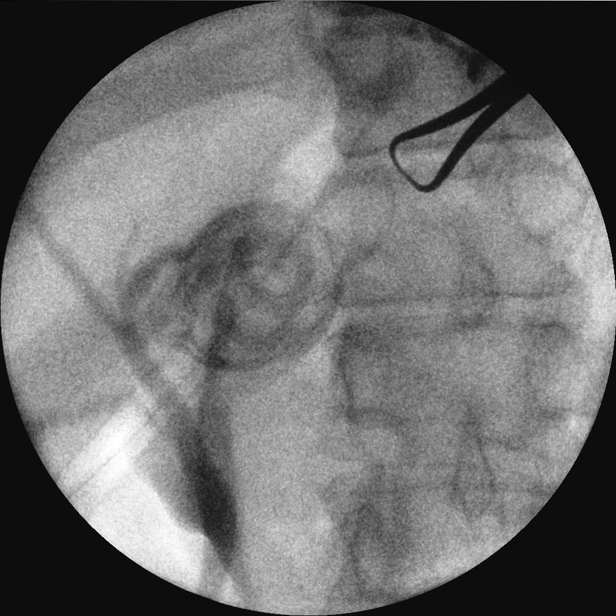
[frame 4/17]
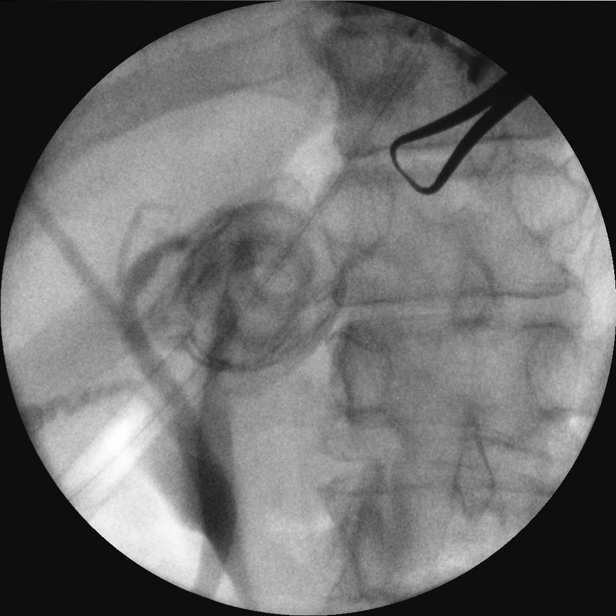
[frame 6/17]
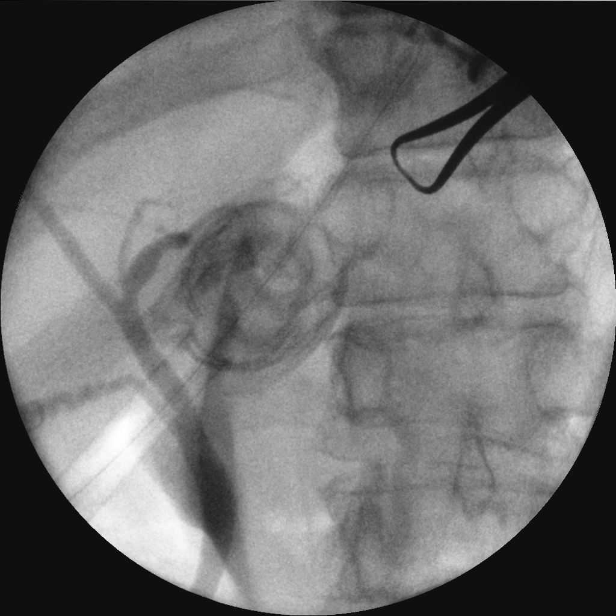
[frame 8/17]
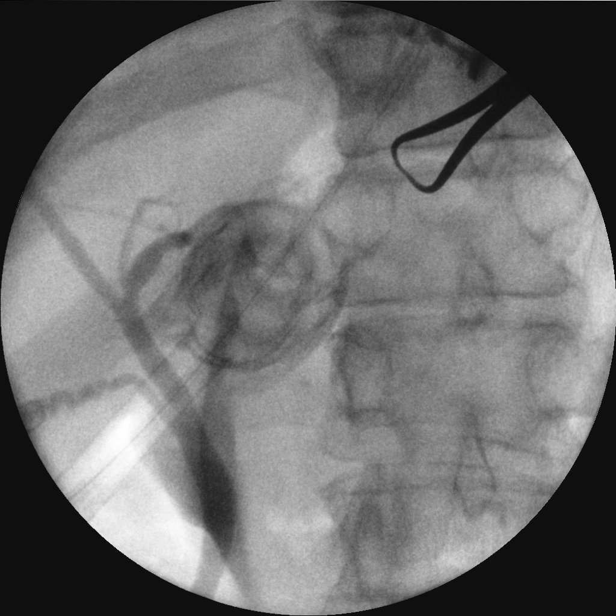
[frame 11/17]
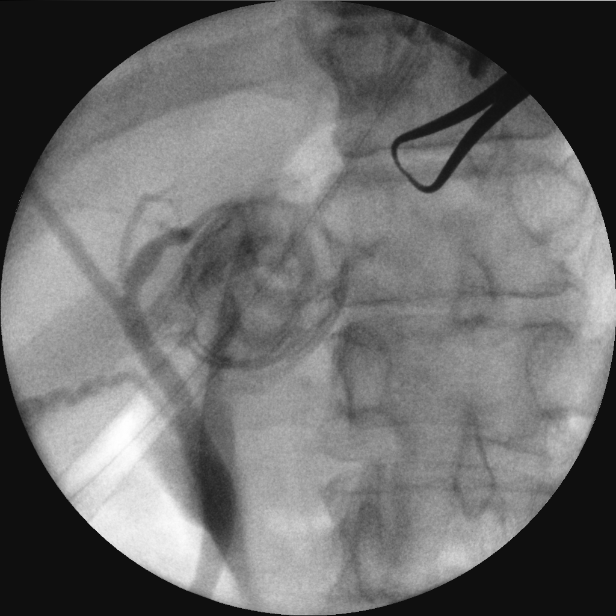
[frame 13/17]
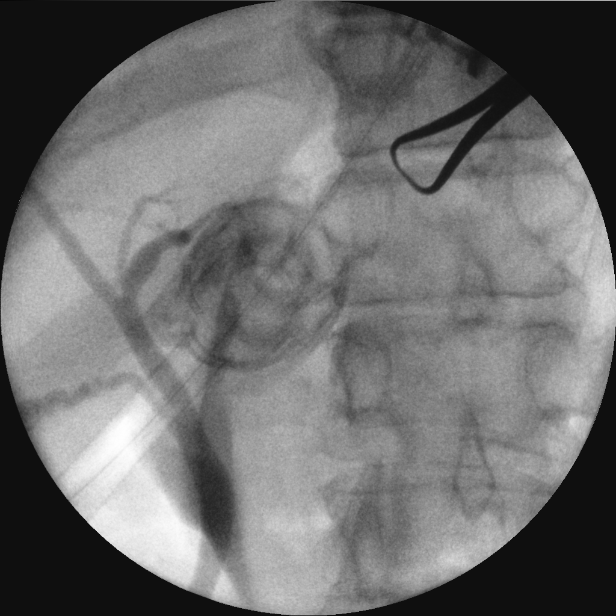
[frame 15/17]
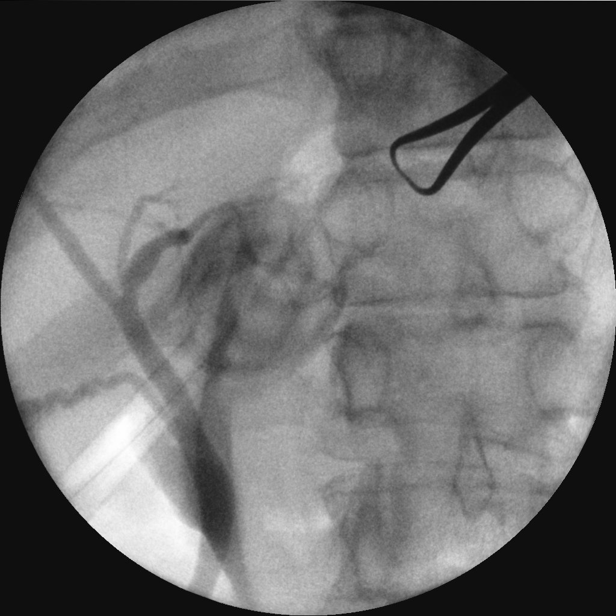

[Series 15: cont. · 8 of 15 frames shown (2 of 2)]
[frame 1/15]
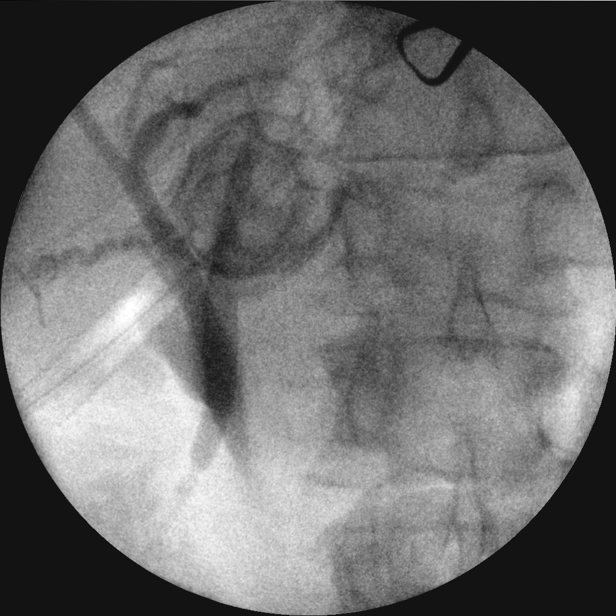
[frame 2/15]
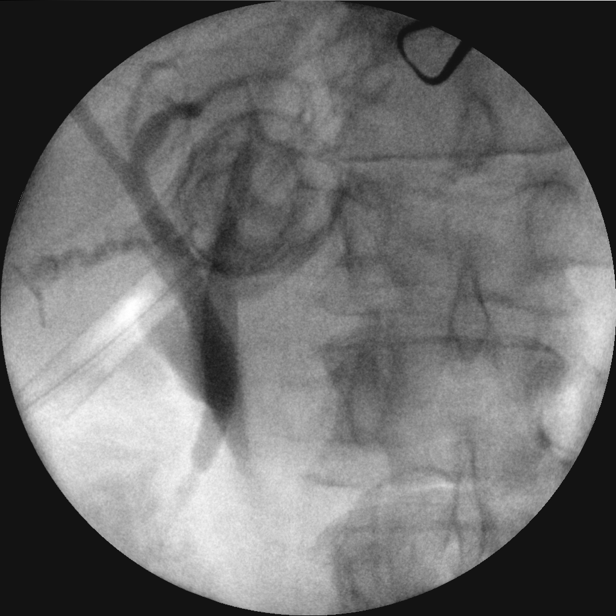
[frame 4/15]
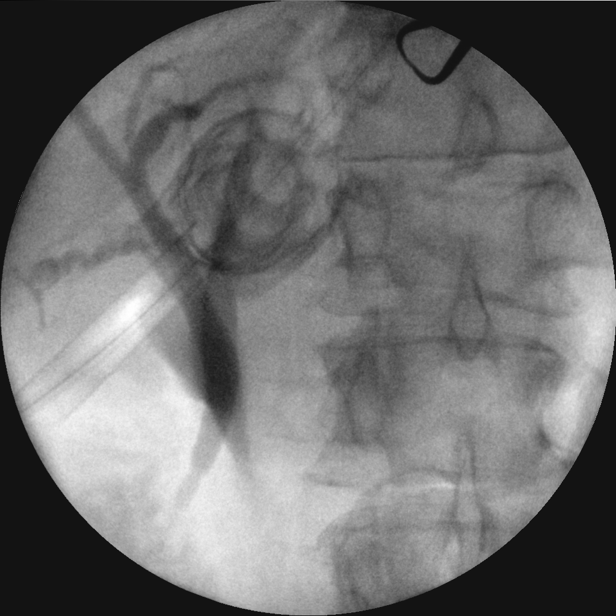
[frame 7/15]
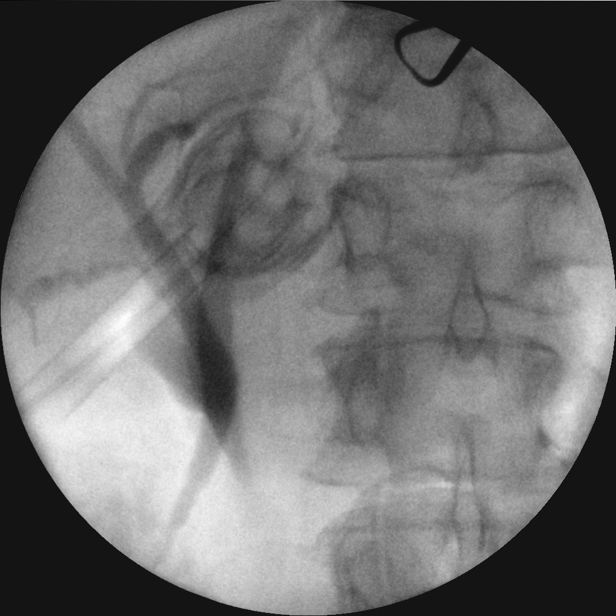
[frame 8/15]
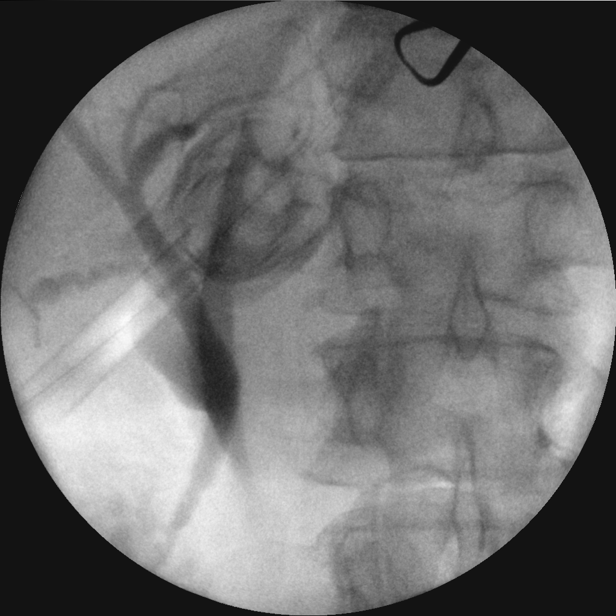
[frame 10/15]
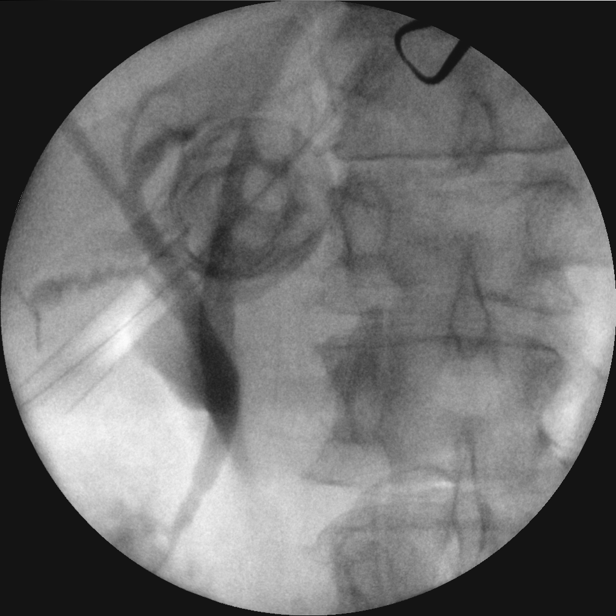
[frame 13/15]
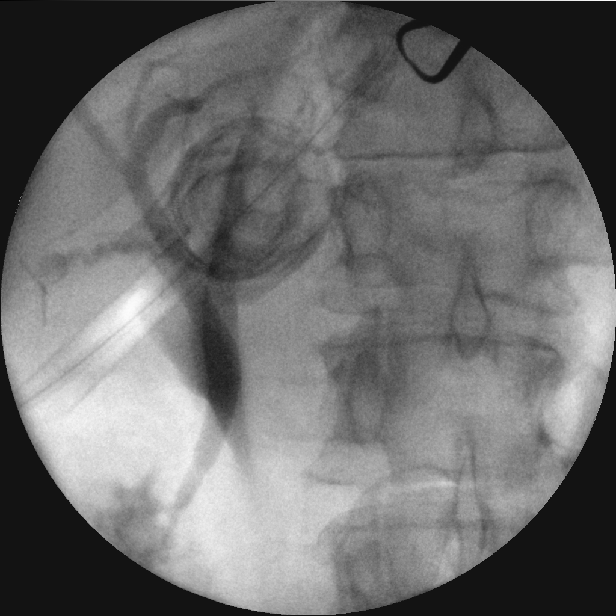
[frame 15/15]
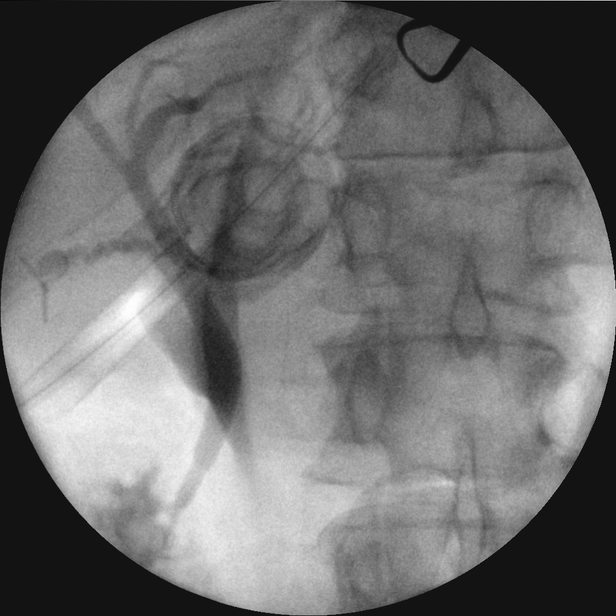

[15 of 20 positions shown; findings below may reference images not displayed]

IMPRESSION: Prominent common bile duct. Widely patent common bile duct.

## 2013-08-29 ENCOUNTER — Telehealth: Payer: Self-pay

## 2013-08-29 NOTE — Telephone Encounter (Signed)
Pt would like monitor results, and also, she has an appt tomorrow she asks if it is ok that she cancel. Please call and advise.

## 2013-08-29 NOTE — Telephone Encounter (Signed)
Informed patient that Dr. [name] will review results with her at appt tomorrow  Patient verbalized understanding

## 2013-08-30 ENCOUNTER — Ambulatory Visit: Payer: BC Managed Care – PPO | Admitting: Cardiology

## 2013-08-31 ENCOUNTER — Other Ambulatory Visit: Payer: Self-pay

## 2013-08-31 ENCOUNTER — Ambulatory Visit (INDEPENDENT_AMBULATORY_CARE_PROVIDER_SITE_OTHER): Payer: BC Managed Care – PPO

## 2013-08-31 DIAGNOSIS — I4891 Unspecified atrial fibrillation: Secondary | ICD-10-CM

## 2013-08-31 NOTE — Telephone Encounter (Signed)
She is on - pradaxa.  My bad.  Only 3 episodes of Afib - will need to correlate with her Sx.  [name], MD

## 2013-08-31 NOTE — Telephone Encounter (Signed)
Informed patient that her holter monitor showed "mostly normal sinus rhythm with sinus arrythmia and PVC's. 3 short runs of afib" Patient verbalized understanding

## 2013-09-01 NOTE — Telephone Encounter (Signed)
LVM 09/01/13

## 2013-09-25 ENCOUNTER — Inpatient Hospital Stay: Payer: Self-pay | Admitting: Family Medicine

## 2013-09-25 LAB — COMPREHENSIVE METABOLIC PANEL
ALT: 27 U/L (ref 12–78)
Albumin: 3.7 g/dL (ref 3.4–5.0)
Alkaline Phosphatase: 102 U/L
Anion Gap: 5 — ABNORMAL LOW (ref 7–16)
BUN: 16 mg/dL (ref 7–18)
Bilirubin,Total: 0.3 mg/dL (ref 0.2–1.0)
CHLORIDE: 103 mmol/L (ref 98–107)
Calcium, Total: 9 mg/dL (ref 8.5–10.1)
Co2: 31 mmol/L (ref 21–32)
Creatinine: 0.87 mg/dL (ref 0.60–1.30)
Glucose: 106 mg/dL — ABNORMAL HIGH (ref 65–99)
Osmolality: 279 (ref 275–301)
POTASSIUM: 4.5 mmol/L (ref 3.5–5.1)
SGOT(AST): 24 U/L (ref 15–37)
SODIUM: 139 mmol/L (ref 136–145)
Total Protein: 7.5 g/dL (ref 6.4–8.2)

## 2013-09-25 LAB — CBC
HCT: 38.5 % (ref 35.0–47.0)
HGB: 13 g/dL (ref 12.0–16.0)
MCH: 30.5 pg (ref 26.0–34.0)
MCHC: 33.7 g/dL (ref 32.0–36.0)
MCV: 91 fL (ref 80–100)
Platelet: 285 10*3/uL (ref 150–440)
RBC: 4.26 10*6/uL (ref 3.80–5.20)
RDW: 13.4 % (ref 11.5–14.5)
WBC: 7.8 10*3/uL (ref 3.6–11.0)

## 2013-09-26 LAB — CBC WITH DIFFERENTIAL/PLATELET
BASOS ABS: 0.1 10*3/uL (ref 0.0–0.1)
Basophil %: 1.2 %
Eosinophil #: 0.3 10*3/uL (ref 0.0–0.7)
Eosinophil %: 6.1 %
HCT: 33.9 % — ABNORMAL LOW (ref 35.0–47.0)
HGB: 11.3 g/dL — AB (ref 12.0–16.0)
LYMPHS ABS: 1.2 10*3/uL (ref 1.0–3.6)
LYMPHS PCT: 22 %
MCH: 30.2 pg (ref 26.0–34.0)
MCHC: 33.4 g/dL (ref 32.0–36.0)
MCV: 91 fL (ref 80–100)
Monocyte #: 0.5 x10 3/mm (ref 0.2–0.9)
Monocyte %: 9.3 %
Neutrophil #: 3.4 10*3/uL (ref 1.4–6.5)
Neutrophil %: 61.4 %
PLATELETS: 212 10*3/uL (ref 150–440)
RBC: 3.73 10*6/uL — ABNORMAL LOW (ref 3.80–5.20)
RDW: 13.5 % (ref 11.5–14.5)
WBC: 5.6 10*3/uL (ref 3.6–11.0)

## 2013-09-26 LAB — BASIC METABOLIC PANEL
Anion Gap: 6 — ABNORMAL LOW (ref 7–16)
BUN: 16 mg/dL (ref 7–18)
CALCIUM: 8.4 mg/dL — AB (ref 8.5–10.1)
CHLORIDE: 105 mmol/L (ref 98–107)
CREATININE: 0.9 mg/dL (ref 0.60–1.30)
Co2: 29 mmol/L (ref 21–32)
EGFR (Non-African Amer.): >60
GLUCOSE: 134 mg/dL — AB (ref 65–99)
Osmolality: 283 (ref 275–301)
POTASSIUM: 3.9 mmol/L (ref 3.5–5.1)
Sodium: 140 mmol/L (ref 136–145)

## 2013-09-27 LAB — VANCOMYCIN, TROUGH
VANCOMYCIN, TROUGH: 15 ug/mL (ref 10–20)
Vancomycin, Trough: 22 ug/mL (ref 10–20)

## 2013-09-27 LAB — CREATININE, SERUM: Creatinine: 0.84 mg/dL (ref 0.60–1.30)

## 2013-09-28 LAB — CBC WITH DIFFERENTIAL/PLATELET
BASOS PCT: 1.1 %
Basophil #: 0.1 10*3/uL (ref 0.0–0.1)
EOS PCT: 6.6 %
Eosinophil #: 0.4 10*3/uL (ref 0.0–0.7)
HCT: 38.2 % (ref 35.0–47.0)
HGB: 12.5 g/dL (ref 12.0–16.0)
LYMPHS PCT: 22.4 %
Lymphocyte #: 1.4 10*3/uL (ref 1.0–3.6)
MCH: 30.8 pg (ref 26.0–34.0)
MCHC: 32.9 g/dL (ref 32.0–36.0)
MCV: 94 fL (ref 80–100)
MONO ABS: 0.5 x10 3/mm (ref 0.2–0.9)
MONOS PCT: 8.2 %
NEUTROS PCT: 61.7 %
Neutrophil #: 4 10*3/uL (ref 1.4–6.5)
PLATELETS: 256 10*3/uL (ref 150–440)
RBC: 4.08 10*6/uL (ref 3.80–5.20)
RDW: 13.4 % (ref 11.5–14.5)
WBC: 6.5 10*3/uL (ref 3.6–11.0)

## 2013-09-29 LAB — WOUND AEROBIC CULTURE

## 2013-09-30 LAB — CULTURE, BLOOD (SINGLE)

## 2013-10-04 ENCOUNTER — Encounter: Payer: Self-pay | Admitting: Cardiology

## 2013-10-04 ENCOUNTER — Ambulatory Visit (INDEPENDENT_AMBULATORY_CARE_PROVIDER_SITE_OTHER): Payer: BC Managed Care – PPO | Admitting: Cardiology

## 2013-10-04 VITALS — BP 124/78 | HR 62 | Ht 65.0 in | Wt 251.2 lb

## 2013-10-04 DIAGNOSIS — R011 Cardiac murmur, unspecified: Secondary | ICD-10-CM

## 2013-10-04 DIAGNOSIS — I872 Venous insufficiency (chronic) (peripheral): Secondary | ICD-10-CM

## 2013-10-04 DIAGNOSIS — R61 Generalized hyperhidrosis: Secondary | ICD-10-CM

## 2013-10-04 DIAGNOSIS — L74519 Primary focal hyperhidrosis, unspecified: Secondary | ICD-10-CM

## 2013-10-04 DIAGNOSIS — I8310 Varicose veins of unspecified lower extremity with inflammation: Secondary | ICD-10-CM

## 2013-10-04 DIAGNOSIS — I4891 Unspecified atrial fibrillation: Secondary | ICD-10-CM

## 2013-10-04 DIAGNOSIS — I10 Essential (primary) hypertension: Secondary | ICD-10-CM

## 2013-10-04 DIAGNOSIS — I480 Paroxysmal atrial fibrillation: Secondary | ICD-10-CM

## 2013-10-04 DIAGNOSIS — R0789 Other chest pain: Secondary | ICD-10-CM

## 2013-10-04 NOTE — Patient Instructions (Signed)
Your physician wants you to follow-up in: 1 year with Dr. [name]. You will receive a reminder letter in the mail two months in advance. If you don't receive a letter, please call our office to schedule the follow-up appointment.   Your physician recommends that you continue on your current medications as directed. Please refer to the Current Medication list given to you today.  If you feel that you are in rapid afib and it is persistent please visit the ED  If you feel like you are having more episodes than normal please call us [phone]

## 2013-10-06 ENCOUNTER — Encounter: Payer: Self-pay | Admitting: Cardiology

## 2013-10-06 NOTE — Assessment & Plan Note (Signed)
Well-controlled on ACE inhibitor. 

## 2013-10-06 NOTE — Assessment & Plan Note (Signed)
I am still not sure what was causing her symptoms. Clearly nothing really cardiac can explain it.  She will is not noticed A. fib for many years, and no suspicion that the A. fib and alcohol to be symptomatic. In the absence of endocarditis or any significant issues with a heart would defer to her other care providers to figure out the swelling.

## 2013-10-06 NOTE — Progress Notes (Signed)
PATIENT: [name] MRN: [id number] DOB: 01/02/1951 PCP: Gillian Scarce, MD  Clinic Note: Chief Complaint  Patient presents with  . other    Follow up from [place], [place] & was at [facility] last week for 4 days with leg infection. Meds reviewed by the patient verbally.     HPI: [name] is a 63 y.o. female with a PMH below who presents today for muscle back in early May to evaluate episodes of excessive sweating and shaking. She presently has a long-standing history of PAF on Pradaxa for anticoagulation with a rate or rhythm control.   We did have her wear a CardioNet event monitor which did show several episodes of short bursts of A. fib as well as frequent PACs and PVCs. She also had an echocardiogram performed which suggested a possible mitral valve lesion. Because there was concern that this could easily represent subacute bacterial endocarditis (SBE), blood cultures were checked and a TEE was performed which did not show any abnormal findings. Blood cultures were negative.  Interval History: Since her last visit she really has not been noticing much in the way of problems as far as the sweating and jitteriness. Those are much less frequent. She is in trouble however with bilateral lower leg infections for which she antibiotics. She apparently has been on 2 long road trips which have led to worsening lower extremity edema and what seems like stasis ulcers. She has been referred to the vein the vascular specialists for evaluation. My suspicion is that this is probably venous stasis and not peripheral arterial disease.  She really does not notice sensation of atrial fibrillation, stating that she was diagnosed about 8 years ago and has been on anticoagulation since -- the monitor suggested A. fib, she didn't notice any problems. She says this is definitely cut back on her coffee intake and they said that made him then or the problem with her sweating. They could also have been  exacerbating her A. fib. She had been drinking lots of iced coffee She denies any melena, hematochezia, hematuria, epistaxis or significant bruising.  She denies any chest tightness/ pressure or dyspnea at rest or exertion unless he overexerts or self he is short of breath.  Past Medical History  Diagnosis Date  . Sinus problem   . Swelling   . Thyroid disease   . Diabetes   . Venous stasis dermatitis of both lower extremities   . Hx of blood clots   . HBP (high blood pressure)   . Kidney disease   . Depression   . Dysthymic disorder   . Paroxysmal atrial fibrillation 2007    In June 2015, Cardiac Event Monitor: Mostly SR/sinus arrhythmia with PVCs that are frequent. Short bursts of A. fib lasting several minutes  . Asthma   . GERD (gastroesophageal reflux disease)   . Heart murmur     Echocardiogram June 2015: Mild MR (possible vegetation seen on TTE, not seen on TEE), normal LV size with moderate concentric LVH. Normal function EF 60-65%. Normal diastolic function. Mild LA dilation.    Prior Cardiac Evaluation and Past Surgical History: Past Surgical History  Procedure Laterality Date  . Hernia repair    . Ankle surgery Right   . Vaginal hysterectomy    . Sleeve gastroplasty    . Lithotripsy    . Kidney surgery    . Transthoracic echocardiogram  08/03/2013    Mild-Moderate concentric LVH, EF 60-65%. Normal diastolic function. Mild LA dilation. Mild  MR with possible vegetation  - not confirmed on TEE   . Transesophageal echocardiogram  08/08/2013    Mild LVH, EF 60-65%. Moderate LA dilation and mild RA dilation. Mild MR with no evidence of stenosis and no evidence of endocarditis. A false chordae is noted.    Allergies  Allergen Reactions  . Aspirin Hives  . Penicillins Hives  . Terramycin [Oxytetracycline] Hives    Current Outpatient Prescriptions  Medication Sig Dispense Refill  . ALPRAZolam (XANAX) 0.25 MG tablet Take 0.5 mg by mouth as needed.       Marland Kitchen buPROPion  (WELLBUTRIN XL) 300 MG 24 hr tablet Take 300 mg by mouth daily.      . dabigatran (PRADAXA) 150 MG CAPS capsule Take 150 mg by mouth 2 (two) times daily.      Marland Kitchen doxycycline (VIBRA-TABS) 100 MG tablet Take 100 mg by mouth 2 (two) times daily.       . DULoxetine (CYMBALTA) 60 MG capsule Take 60 mg by mouth daily.      . Fluticasone-Salmeterol (ADVAIR) 250-50 MCG/DOSE AEPB Inhale 2 puffs into the lungs 2 (two) times daily.      Marland Kitchen levothyroxine (SYNTHROID, LEVOTHROID) 150 MCG tablet Take 150 mcg by mouth daily before breakfast.      . metFORMIN (GLUCOPHAGE) 1000 MG tablet Take 1,000 mg by mouth daily after breakfast.      . metFORMIN (GLUCOPHAGE) 500 MG tablet Take 500 mg by mouth daily at 10 pm.      . montelukast (SINGULAIR) 10 MG tablet Take 10 mg by mouth at bedtime.      . quinapril (ACCUPRIL) 20 MG tablet Take 20 mg by mouth at bedtime.      Marland Kitchen zolpidem (AMBIEN CR) 12.5 MG CR tablet Take 12.5 mg by mouth at bedtime as needed for sleep.       No current facility-administered medications for this visit.   Social and Family History Reviewed in Epic  ROS: A comprehensive Review of Systems - Negative except Symptoms noted in history of present illness. Intermittent abdominal discomfort since her surgery that is rare she denies any problems with her diet, but occasionally has changes in bowel movements. No flushing or rashes associated with eating  PHYSICAL EXAM BP 124/78  Pulse 62  Ht 5\' 5"  (1.651 m)  Wt 251 lb 4 oz (113.966 kg)  BMI 41.81 kg/m2 General appearance: alert, cooperative, appears stated age, no distress, moderately obese and A pleasant mood with normal affect, but easily moved to tears but certainty she discusses. Answers questions appropriately. Neck: no adenopathy, no carotid bruit, no JVD and supple, symmetrical, trachea midline Lungs: clear to auscultation bilaterally, normal percussion bilaterally and Nonlabored, good air movement Heart: Distant, but mostly normal S1 and S2.  No notable murmur heard. No rubs or gallops heard. Unable to palpate PMI due to body habitus. Abdomen: Obese but otherwise soft/NT/M.D./NA BS. No HSM palpable due to obesity there Extremities: She actually does not have any edema but had bilateral venous stasis changes with multiple little bandages from recent infections. Pulses: 2+ and symmetric Neurologic: Alert and oriented X 3, normal strength and tone. Normal symmetric reflexes. Normal coordination and gait   Adult ECG Report - not checked  Cardiac Event Monitor: Mostly SR/sinus arrhythmia with PVCs that are frequent. Short bursts of A. fib lasting several minutes  Recent Labs: None  ASSESSMENT / PLAN: Very pleasant 63 year old morbidly obese woman with a long-standing history of paroxysmal A. fib (mostly asymptomatic) following up  from her initial cardiac evaluation. Still no clear etiology for her seemingly increased swelling episodes. Activities have improved but partly because of her stopping her significant coffee intake. She has asymptomatic A. fib, with no major problems with anticoagulation. She did have what appears to be venous stasis dermatitis with recent exacerbations after long road trips. Scheduled to see vascular surgery.   Hyperhidrosis - unclear etiology I am still not sure what was causing her symptoms. Clearly nothing really cardiac can explain it.  She will is not noticed A. fib for many years, and no suspicion that the A. fib and alcohol to be symptomatic. In the absence of endocarditis or any significant issues with a heart would defer to her other care providers to figure out the swelling.  Paroxysmal atrial fibrillation Relatively asymptomatic. Short bursts noted on monitor. Anticoagulated with Pradaxa, no problems with bleeding. At this point I don't think she needs any rate control or rhythm control.  Essential hypertension Well controlled on ACE inhibitor.  Heart murmur The only potential source for murmur  would be much regurgitation. I do not hear that today, but is most likely what the cause was. The only valvular lesion that could explain it would be mitral regurgitation  Venous stasis dermatitis of both lower extremities Currently referred to vascular surgery. She does not have much leg edema, unless she is on a long trip. His current episode is resulting from her recent trip.  I recommended that she wear support stockings/compression stockings and interest she keeps her feet elevated.   Orders Placed This Encounter  Procedures  . EKG 12-Lead    Followup: Roughly 4-6 weeks if study is abnormal, otherwise when necessary  [name], M.D., M.S. Interventional Cardiologist   Pager # [phone]

## 2013-10-06 NOTE — Assessment & Plan Note (Signed)
Currently referred to vascular surgery. She does not have much leg edema, unless she is on a long trip. His current episode is resulting from her recent trip.  I recommended that she wear support stockings/compression stockings and interest she keeps her feet elevated.

## 2013-10-06 NOTE — Assessment & Plan Note (Signed)
Relatively asymptomatic. Short bursts noted on monitor. Anticoagulated with Pradaxa, no problems with bleeding. At this point I don't think she needs any rate control or rhythm control.

## 2013-10-06 NOTE — Assessment & Plan Note (Signed)
The only potential source for murmur would be much regurgitation. I do not hear that today, but is most likely what the cause was. The only valvular lesion that could explain it would be mitral regurgitation

## 2013-10-19 DIAGNOSIS — G4730 Sleep apnea, unspecified: Secondary | ICD-10-CM | POA: Insufficient documentation

## 2013-10-19 DIAGNOSIS — G4733 Obstructive sleep apnea (adult) (pediatric): Secondary | ICD-10-CM | POA: Insufficient documentation

## 2013-10-27 ENCOUNTER — Ambulatory Visit: Payer: Self-pay | Admitting: Pain Medicine

## 2013-11-14 IMAGING — US ABDOMEN ULTRASOUND
1 series · 14 of 25 positions shown · non-contrast
Comparison: none

REASON FOR EXAM: epigastric pain
COMMENTS:

PROCEDURE:     US  - US ABDOMEN GENERAL SURVEY  - August 28, 2011  [DATE]
RESULT:     Comparison: None
TECHNIQUE: Multiple gray-scale and color-flow Doppler images of the abdomen
are presented for review.

[Series 1: abdomen ultrasound · 0.31mm/px · 14 of 60 slices shown]
[im 1/60]
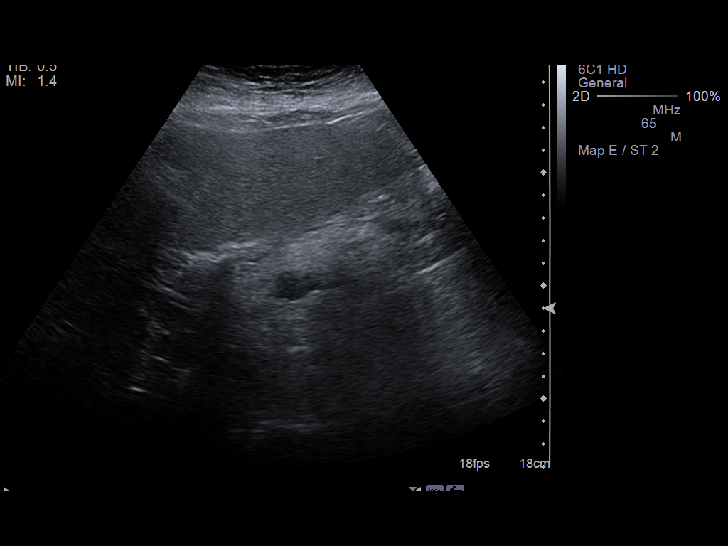
[im 5/60]
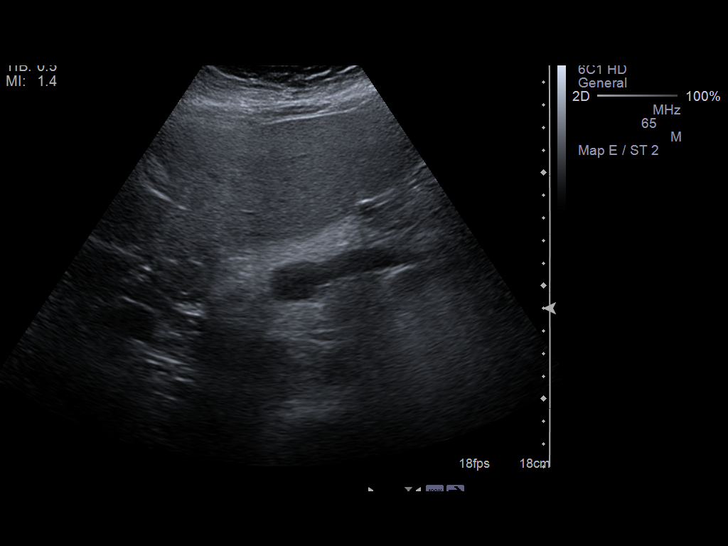
[im 10/60]
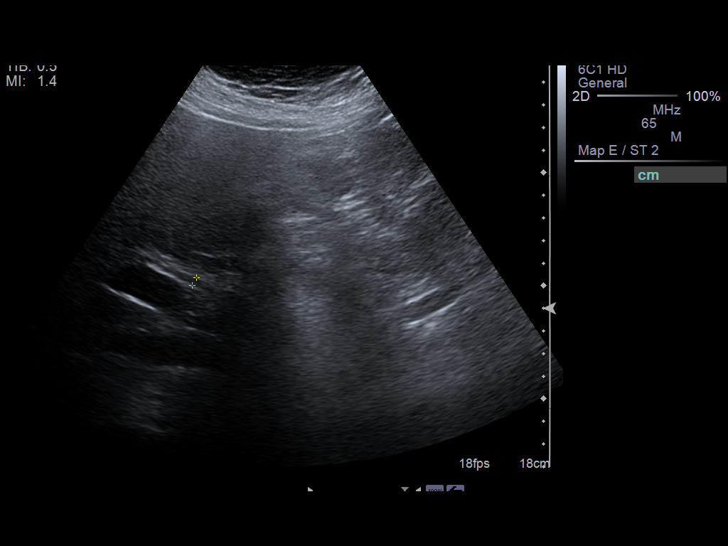
[im 15/60]
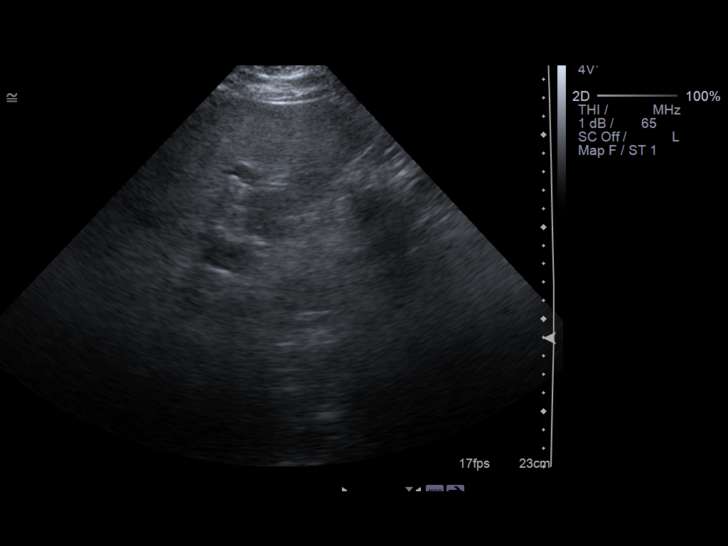
[im 20/60]
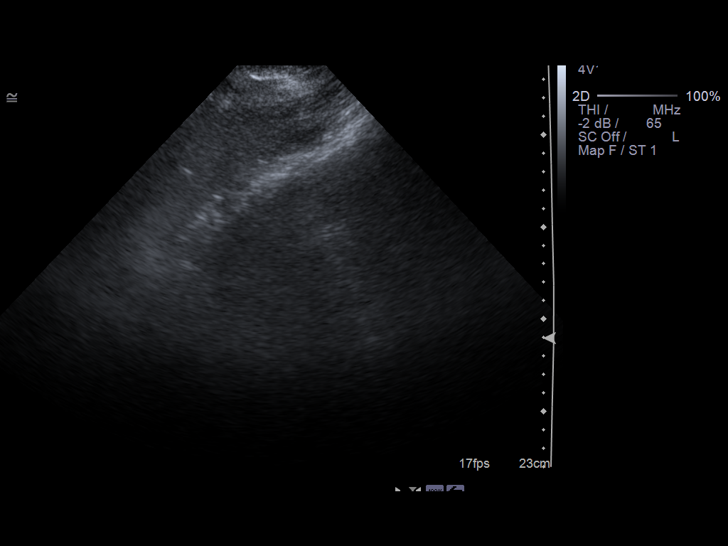
[im 23/60]
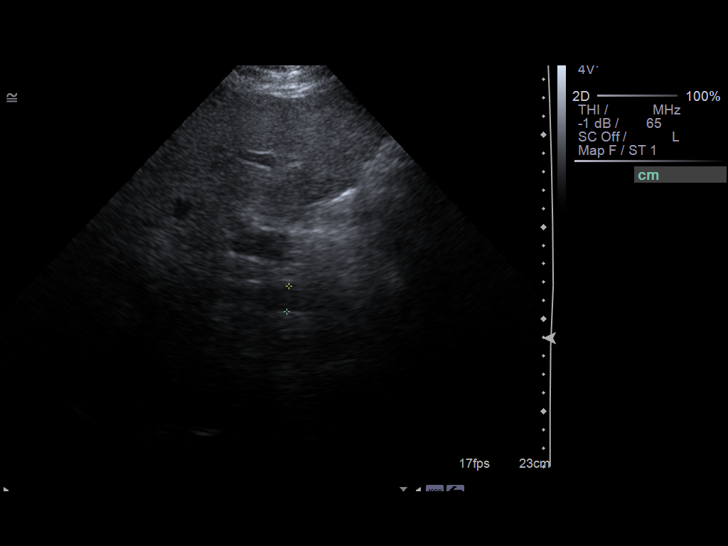
[im 28/60]
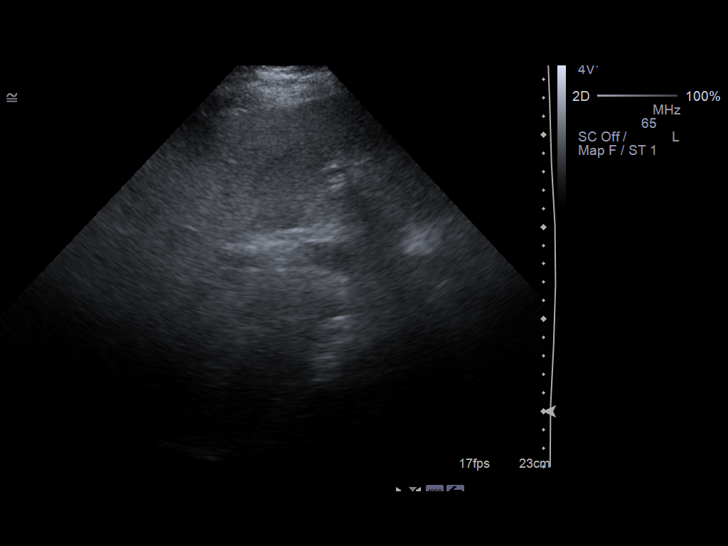
[im 32/60]
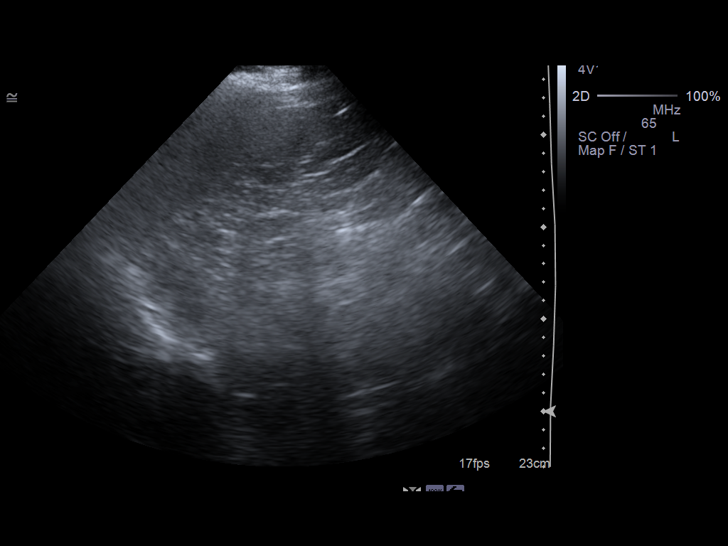
[im 37/60]
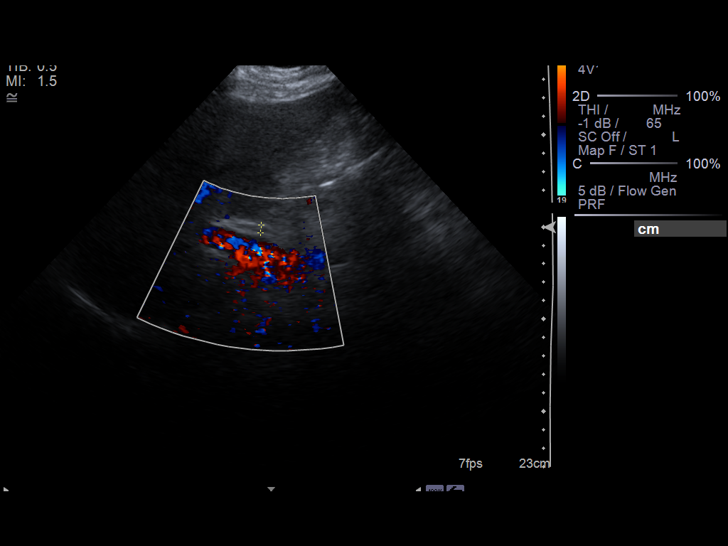
[im 40/60]
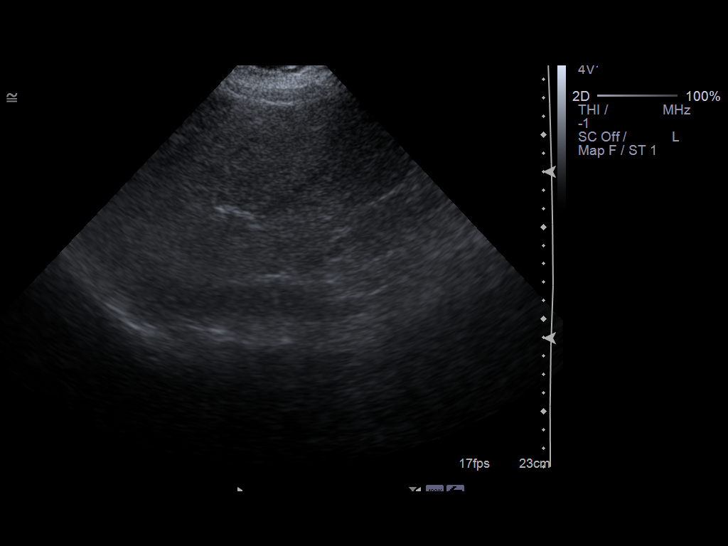
[im 45/60]
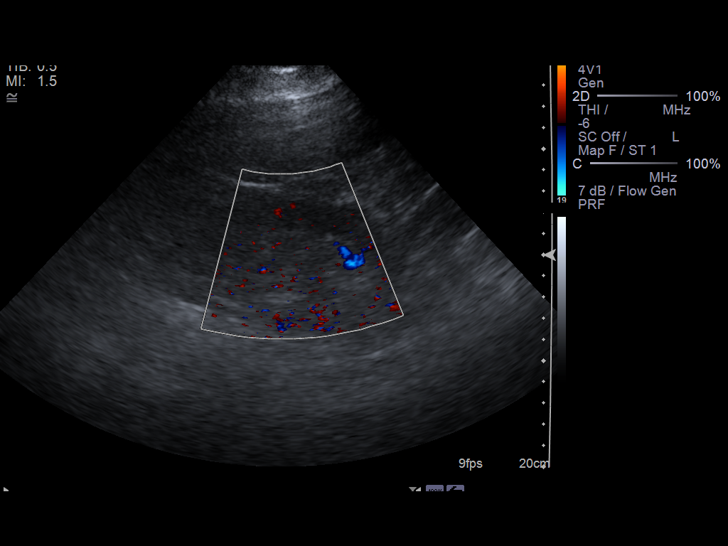
[im 50/60]
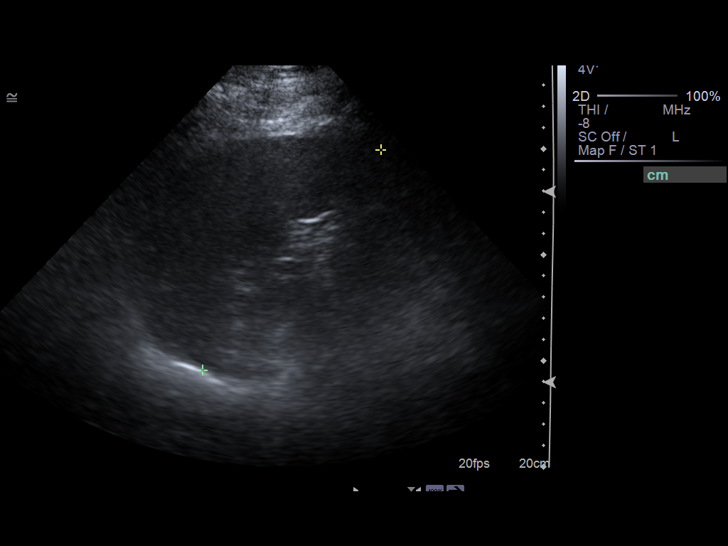
[im 55/60]
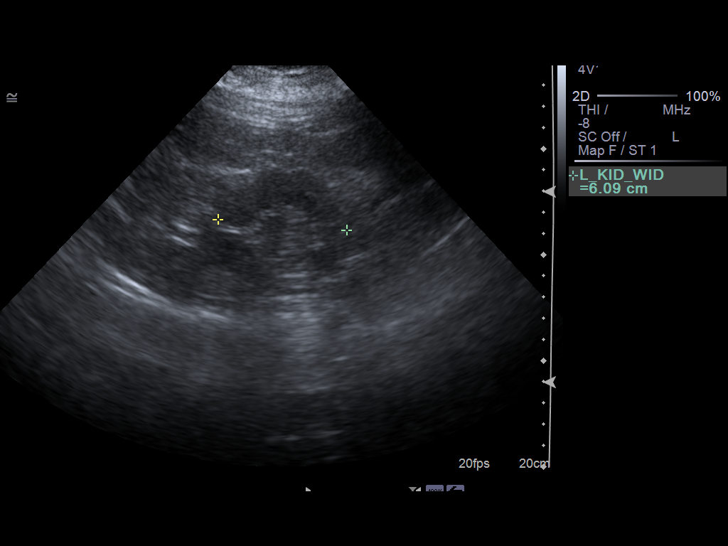
[im 60/60]
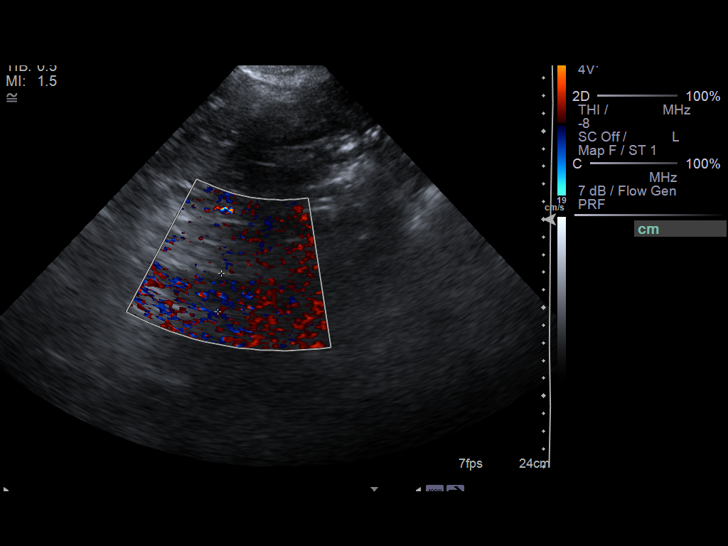

[14 of 25 positions shown; findings below may reference images not displayed]

FINDINGS: The liver is increased in echogenicity as can be seen with hepatic
steatosis. The liver is without evidence of a focal hepatic lesion.

There is evidence of prior cholecystectomy. There is no intra- or
extrahepatic biliary ductal dilatation. The common duct measures up 4.3 mm
in maximal diameter.

The tail of the pancreas is not visualized. The visualized portion of the
pancreas is normal in echogenicity. The spleen is unremarkable. Bilateral
kidneys are normal in echogenicity and size. The right kidney measures
x 6.8 x 6.4 cm. The left kidney measures 12 x 6 x 6 cm. There are no renal
calculi or hydronephrosis. The abdominal aorta and IVC are unremarkable.
IMPRESSION: 1. Prior cholecystectomy. No intrahepatic or extrahepatic biliary ductal
dilatation.
2. Hepatic steatosis.

## 2013-11-17 ENCOUNTER — Encounter: Payer: Self-pay | Admitting: Surgery

## 2013-11-20 ENCOUNTER — Encounter: Payer: Self-pay | Admitting: Surgery

## 2013-11-20 ENCOUNTER — Ambulatory Visit (INDEPENDENT_AMBULATORY_CARE_PROVIDER_SITE_OTHER)
Admission: RE | Admit: 2013-11-20 | Discharge: 2013-11-20 | Disposition: A | Discharge status: Home / Self Care | Payer: BC Managed Care – PPO | Source: Ambulatory Visit | Attending: Surgery | Admitting: Surgery

## 2013-11-20 ENCOUNTER — Ambulatory Visit (HOSPITAL_COMMUNITY)
Admission: RE | Admit: 2013-11-20 | Discharge: 2013-11-20 | Disposition: A | Discharge status: Home / Self Care | Payer: BC Managed Care – PPO | Source: Ambulatory Visit | Attending: Surgery | Admitting: Surgery

## 2013-11-20 ENCOUNTER — Ambulatory Visit (INDEPENDENT_AMBULATORY_CARE_PROVIDER_SITE_OTHER): Payer: BC Managed Care – PPO | Admitting: Surgery

## 2013-11-20 VITALS — BP 143/58 | HR 70 | Ht 65.0 in | Wt 260.0 lb

## 2013-11-20 DIAGNOSIS — E1142 Type 2 diabetes mellitus with diabetic polyneuropathy: Secondary | ICD-10-CM | POA: Insufficient documentation

## 2013-11-20 DIAGNOSIS — Z86718 Personal history of other venous thrombosis and embolism: Secondary | ICD-10-CM | POA: Insufficient documentation

## 2013-11-20 DIAGNOSIS — E1149 Type 2 diabetes mellitus with other diabetic neurological complication: Secondary | ICD-10-CM | POA: Diagnosis not present

## 2013-11-20 DIAGNOSIS — I4891 Unspecified atrial fibrillation: Secondary | ICD-10-CM | POA: Insufficient documentation

## 2013-11-20 DIAGNOSIS — Z9884 Bariatric surgery status: Secondary | ICD-10-CM | POA: Insufficient documentation

## 2013-11-20 DIAGNOSIS — L97909 Non-pressure chronic ulcer of unspecified part of unspecified lower leg with unspecified severity: Secondary | ICD-10-CM

## 2013-11-20 DIAGNOSIS — I739 Peripheral vascular disease, unspecified: Secondary | ICD-10-CM | POA: Insufficient documentation

## 2013-11-20 NOTE — Progress Notes (Signed)
Patient name: [name] MRN: [id number] DOB: 08-15-1950 Sex: female   Referred by: Dr. Nadine Counts  Reason for referral:  Chief Complaint  Patient presents with  . New Evaluation    venous vs. arterial     HISTORY OF PRESENT ILLNESS: This is a 63 yo female who comes in with a 4 week history of non-healing wounds to bilateral lower extremity.  She has a history of a right leg DVT 20 years ago.  She states that she has been on her legs excessively recently which led to worsening edema  And ultimately bilateral ulcers.  She occasionally wears compression stockings.  She has a history of bariatric surgery abd had lost 70 pounds in the last year.  She has diabetes and suffers from peripheral neuropathy in both feet. She has a history of venous stasis ulcers which have healed in the past.  She suffers from atrial fib and is on pradaxa   Past Medical History  Diagnosis Date  . Sinus problem   . Swelling   . Thyroid disease   . Diabetes   . Venous stasis dermatitis of both lower extremities   . Hx of blood clots   . HBP (high blood pressure)   . Kidney disease   . Depression   . Dysthymic disorder   . Paroxysmal atrial fibrillation 2007    In June 2015, Cardiac Event Monitor: Mostly SR/sinus arrhythmia with PVCs that are frequent. Short bursts of A. fib lasting several minutes  . Asthma   . GERD (gastroesophageal reflux disease)   . Heart murmur     Echocardiogram June 2015: Mild MR (possible vegetation seen on TTE, not seen on TEE), normal LV size with moderate concentric LVH. Normal function EF 60-65%. Normal diastolic function. Mild LA dilation.  . DVT (deep venous thrombosis)     Past Surgical History  Procedure Laterality Date  . Hernia repair    . Ankle surgery Right   . Vaginal hysterectomy    . Sleeve gastroplasty    . Lithotripsy    . Kidney surgery    . Transthoracic echocardiogram  08/03/2013    Mild-Moderate concentric LVH, EF 60-65%. Normal diastolic  function. Mild LA dilation. Mild MR with possible vegetation  - not confirmed on TEE   . Transesophageal echocardiogram  08/08/2013    Mild LVH, EF 60-65%. Moderate LA dilation and mild RA dilation. Mild MR with no evidence of stenosis and no evidence of endocarditis. A false chordae is noted.    History   Social History  . Marital Status: Married    Spouse Name: N/A    Number of Children: N/A  . Years of Education: N/A   Occupational History  . Not on file.   Social History Main Topics  . Smoking status: Never Smoker   . Smokeless tobacco: Never Used  . Alcohol Use: No  . Drug Use: No  . Sexual Activity: Not on file   Other Topics Concern  . Not on file   Social History Narrative   She is currently married -- for 28 years. Does not work. Does not smoke or take alcohol. She never smoked. She exercises at least 3 days a week since before her gastric surgery.   Marital status reviewed in history of present illness.    Family History  Problem Relation Age of Onset  . Skin cancer Father   . Deep vein thrombosis Father   . Heart disease Father   .  Diabetes Father   . Hypertension Father   . Peripheral vascular disease Father   . Varicose Veins Mother     Allergies as of 11/20/2013 - Review Complete 11/20/2013  Allergen Reaction Noted  . Aspirin Hives 02/09/2013  . Penicillins Hives 02/09/2013  . Terramycin [oxytetracycline] Hives 02/09/2013    Current Outpatient Prescriptions on File Prior to Visit  Medication Sig Dispense Refill  . ALPRAZolam (XANAX) 0.25 MG tablet Take 0.5 mg by mouth as needed.       Marland Kitchen buPROPion (WELLBUTRIN XL) 300 MG 24 hr tablet Take 300 mg by mouth daily.      . dabigatran (PRADAXA) 150 MG CAPS capsule Take 150 mg by mouth 2 (two) times daily.      . DULoxetine (CYMBALTA) 60 MG capsule Take 60 mg by mouth daily.      . Fluticasone-Salmeterol (ADVAIR) 250-50 MCG/DOSE AEPB Inhale 2 puffs into the lungs 2 (two) times daily.      Marland Kitchen levothyroxine  (SYNTHROID, LEVOTHROID) 150 MCG tablet Take 150 mcg by mouth daily before breakfast.      . metFORMIN (GLUCOPHAGE) 1000 MG tablet Take 1,000 mg by mouth daily after breakfast.      . metFORMIN (GLUCOPHAGE) 500 MG tablet Take 500 mg by mouth daily at 10 pm.      . montelukast (SINGULAIR) 10 MG tablet Take 10 mg by mouth at bedtime.      . quinapril (ACCUPRIL) 20 MG tablet Take 20 mg by mouth at bedtime.      Marland Kitchen zolpidem (AMBIEN CR) 12.5 MG CR tablet Take 12.5 mg by mouth at bedtime as needed for sleep.      Marland Kitchen doxycycline (VIBRA-TABS) 100 MG tablet Take 100 mg by mouth 2 (two) times daily.        No current facility-administered medications on file prior to visit.     REVIEW OF SYSTEMS: Cardiovascular: No chest pain, chest pressure, palpitations, orthopnea, or dyspnea on exertion. No claudication or rest pain, positive history of DVT Pulmonary: No productive cough, asthma or wheezing. Neurologic: No weakness, paresthesias, aphasia, or amaurosis. No dizziness. Hematologic: No bleeding problems or clotting disorders. Musculoskeletal: No joint pain or joint swelling. Gastrointestinal: No blood in stool or hematemesis Genitourinary: No dysuria or hematuria. Psychiatric:: No history of major depression. Integumentary: No rashes or ulcers. Constitutional: No fever or chills.  PHYSICAL EXAMINATION: General: The patient appears their stated age.  Vital signs are BP 143/58  Pulse 70  Ht 5\' 5"  (1.651 m)  Wt 260 lb (117.935 kg)  BMI 43.27 kg/m2  SpO2 98% HEENT:  No gross abnormalities Pulmonary: Respirations are non-labored Abdomen: Soft and non-tender  Musculoskeletal: There are no major deformities.   Neurologic: No focal weakness or paresthesias are detected, Skin: bilateral LE ulcers which are small. Psychiatric: The patient has normal affect. Cardiovascular: There is a regular rate and rhythm without significant murmur appreciated.palpable pedal pulses.  Bilateral edema  Diagnostic  Studies: She is to return later this afternoon for arterial and venous duplex studies  Assessment:  Bilateral lower extremity ulcers Plan: The patient will return later today for venous reflux studies and arterial dopplers.  I am going to place her in bilateral UNNA boots to decrease her leg swelling in an attempt to heal her ulcers.  Once they have healed she will need to go into bilateral compression.  She would be a candidate for laser ablation of her superficial venous system if her doppler studies are positive for reflux.  She  will return for a wound check in a few weeks.     Eldridge Abrahams, M.D. [facility] Office: [phone] Pager:  [phone]

## 2013-11-23 ENCOUNTER — Encounter: Payer: Self-pay | Admitting: Surgery

## 2013-12-08 ENCOUNTER — Emergency Department: Payer: Self-pay | Admitting: Emergency Medicine

## 2013-12-08 ENCOUNTER — Encounter: Payer: Self-pay | Admitting: Surgery

## 2013-12-11 ENCOUNTER — Ambulatory Visit (INDEPENDENT_AMBULATORY_CARE_PROVIDER_SITE_OTHER): Payer: BC Managed Care – PPO | Admitting: Surgery

## 2013-12-11 ENCOUNTER — Encounter: Payer: Self-pay | Admitting: Surgery

## 2013-12-11 VITALS — BP 116/82 | HR 115 | Resp 16 | Ht 65.5 in | Wt 255.0 lb

## 2013-12-11 DIAGNOSIS — L97909 Non-pressure chronic ulcer of unspecified part of unspecified lower leg with unspecified severity: Secondary | ICD-10-CM

## 2013-12-11 DIAGNOSIS — E1351 Other specified diabetes mellitus with diabetic peripheral angiopathy without gangrene: Secondary | ICD-10-CM | POA: Insufficient documentation

## 2013-12-11 NOTE — Progress Notes (Signed)
Patient name: [name] MRN: [id number] DOB: Feb 26, 1951 Sex: female     Chief Complaint  Patient presents with  . Re-evaluation    f/u    HISTORY OF PRESENT ILLNESS: This is a 63 year old female who initially presented with a one month history of nonhealing wounds to both lower extremities.  She has a history of a right leg DVT 20 years ago.  She had recently been on her legs excessively, which led to worsening edema and her ulcers.  She occasionally or compression stockings.  She has a history of bariatric surgery recently and has lost 7 pounds in the last year.  I placed her in bilateral Unna boots to assist with healing.  She also received arterial and venous Doppler studies.  She is back today for followup.  Both legs no longer have nonhealing venous stasis ulcers.  She did recently have an accident on the elliptical which necessitated sutures in her left leg.  Past Medical History  Diagnosis Date  . Sinus problem   . Swelling   . Thyroid disease   . Diabetes   . Venous stasis dermatitis of both lower extremities   . Hx of blood clots   . HBP (high blood pressure)   . Kidney disease   . Depression   . Dysthymic disorder   . Paroxysmal atrial fibrillation 2007    In June 2015, Cardiac Event Monitor: Mostly SR/sinus arrhythmia with PVCs that are frequent. Short bursts of A. fib lasting several minutes  . Asthma   . GERD (gastroesophageal reflux disease)   . Heart murmur     Echocardiogram June 2015: Mild MR (possible vegetation seen on TTE, not seen on TEE), normal LV size with moderate concentric LVH. Normal function EF 60-65%. Normal diastolic function. Mild LA dilation.  . DVT (deep venous thrombosis)     Past Surgical History  Procedure Laterality Date  . Hernia repair    . Ankle surgery Right   . Vaginal hysterectomy    . Sleeve gastroplasty    . Lithotripsy    . Kidney surgery    . Transthoracic echocardiogram  08/03/2013    Mild-Moderate concentric LVH,  EF 60-65%. Normal diastolic function. Mild LA dilation. Mild MR with possible vegetation  - not confirmed on TEE   . Transesophageal echocardiogram  08/08/2013    Mild LVH, EF 60-65%. Moderate LA dilation and mild RA dilation. Mild MR with no evidence of stenosis and no evidence of endocarditis. A false chordae is noted.    History   Social History  . Marital Status: Married    Spouse Name: N/A    Number of Children: N/A  . Years of Education: N/A   Occupational History  . Not on file.   Social History Main Topics  . Smoking status: Never Smoker   . Smokeless tobacco: Never Used  . Alcohol Use: No  . Drug Use: No  . Sexual Activity: Not on file   Other Topics Concern  . Not on file   Social History Narrative   She is currently married -- for 28 years. Does not work. Does not smoke or take alcohol. She never smoked. She exercises at least 3 days a week since before her gastric surgery.   Marital status reviewed in history of present illness.    Family History  Problem Relation Age of Onset  . Skin cancer Father   . Deep vein thrombosis Father   . Heart disease Father   .  Diabetes Father   . Hypertension Father   . Peripheral vascular disease Father   . Varicose Veins Mother     Allergies as of 12/11/2013 - Review Complete 12/11/2013  Allergen Reaction Noted  . Aspirin Hives 02/09/2013  . Penicillins Hives 02/09/2013  . Terramycin [oxytetracycline] Hives 02/09/2013    Current Outpatient Prescriptions on File Prior to Visit  Medication Sig Dispense Refill  . ALPRAZolam (XANAX) 0.25 MG tablet Take 0.5 mg by mouth as needed.       Marland Kitchen buPROPion (WELLBUTRIN XL) 300 MG 24 hr tablet Take 300 mg by mouth daily.      . dabigatran (PRADAXA) 150 MG CAPS capsule Take 150 mg by mouth 2 (two) times daily.      Marland Kitchen dexlansoprazole (DEXILANT) 60 MG capsule Take 60 mg by mouth daily.      . DULoxetine (CYMBALTA) 60 MG capsule Take 60 mg by mouth daily.      . Fluticasone-Salmeterol  (ADVAIR) 250-50 MCG/DOSE AEPB Inhale 2 puffs into the lungs 2 (two) times daily.      Marland Kitchen levothyroxine (SYNTHROID, LEVOTHROID) 150 MCG tablet Take 150 mcg by mouth daily before breakfast.      . metFORMIN (GLUCOPHAGE) 1000 MG tablet Take 1,000 mg by mouth daily after breakfast.      . metFORMIN (GLUCOPHAGE) 500 MG tablet Take 500 mg by mouth daily at 10 pm.      . montelukast (SINGULAIR) 10 MG tablet Take 10 mg by mouth at bedtime.      . quinapril (ACCUPRIL) 20 MG tablet Take 20 mg by mouth at bedtime.      Marland Kitchen zolpidem (AMBIEN CR) 12.5 MG CR tablet Take 12.5 mg by mouth at bedtime as needed for sleep.      Marland Kitchen doxycycline (VIBRA-TABS) 100 MG tablet Take 100 mg by mouth 2 (two) times daily.        No current facility-administered medications on file prior to visit.     REVIEW OF SYSTEMS: Please see history of present illness, otherwise no changes from prior visit  PHYSICAL EXAMINATION:   Vital signs are BP 116/82  Pulse 115  Resp 16  Ht 5' 5.5" (1.664 m)  Wt 255 lb (115.667 kg)  BMI 41.77 kg/m2 General: The patient appears their stated age. HEENT:  No gross abnormalities Pulmonary:  Non labored breathing Musculoskeletal: There are no major deformities. Neurologic: No focal weakness or paresthesias are detected, Skin: Sutures in left lower extremity without erythema.Marland Kitchen Psychiatric: The patient has normal affect. Cardiovascular: Palpable pedal pulse   Diagnostic Studies Venous study: The patient has deep vein reflux in the right leg.  No evidence of thrombosis was identified.  Trace reflux in the superficial system on the right.  None in the left.  Arterial study: No significant arterial disease identified.  Assessment: Bilateral lower extremity ulcers. Plan: The patient has successfully completed a course of the Unna boot therapy which successfully heal the ulcers in both legs.  I have recommended compression stockings to prevent future ulcerations.  I discussed that no significant  superficial system reflux was identified, and therefore she would not be a candidate for laser ablation.  Her reflux is primarily in the deep system on the right.  This is treated with compression stockings.  I also discussed that weight loss may alleviate the need to wear compression stockings.  She will follow up with me on an as-needed basis.  Eldridge Abrahams, M.D. [facility] Office: [phone]  Pager:  [phone]

## 2013-12-18 ENCOUNTER — Ambulatory Visit: Payer: BC Managed Care – PPO | Admitting: Surgery

## 2014-07-04 ENCOUNTER — Emergency Department
Admit: 2014-07-04 | Disposition: A | Discharge status: Home / Self Care | Payer: Self-pay | Admitting: Emergency Medicine

## 2014-07-04 LAB — PROTIME-INR
INR: 1.5
PROTHROMBIN TIME: 18 s — AB

## 2014-07-10 NOTE — Op Note (Signed)
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   PROCEDURE PERFORMED: Repair of the chronic rupture of the left thumb MP joint ulnar collateral ligament rupture.   SURGEON: Park Breed, MD    ANESTHESIA: General endotracheal.   COMPLICATIONS: None.   DRAINS: None.   ESTIMATED BLOOD LOSS: None.   OPERATIVE PROCEDURE: The patient was brought to the operating room where she underwent satisfactory general endotracheal anesthesia in the supine position. The left arm was prepped and draped in a sterile fashion. Esmarch was applied. The tourniquet was inflated to 300 mmHg. Tourniquet time was 44 minutes. A dorsal lateral incision was made over the MP joint of the left thumb and dissection was carried out bluntly through subcutaneous tissue. Sensory nerve was protected and retracted. The investing fascia was identified and the distal end was lifted up and was divided sharply and reflected dorsally and volarly. This showed the scarred remnants of the ulnar collateral ligament. By careful dissection under loupe magnification, the ligament was freed up and followed out to its original attachment area. There was noted to be significant instability. The soft tissues were released from the base of the proximal phalanx and the bone was roughened up. The joint was irrigated and granulation tissue removed. The joint was reduced and a 0.62 C wire was passed across the joint to stabilize it. Next, a mini Mitek anchor was inserted into the base of the proximal phalanx and was felt to be securely fixed. The 0 Ethibond sutures were brought up through the ligament and tied down snugly advancing the ligament back to its original bed along with the scar tissue which had developed. The  4-0 Vicryl was used to reinforce dorsally and volarly. The wound was irrigated. The investing fascia was closed with 4-0 Vicryl. The skin was closed with running 5-0 nylon. 0.5% Marcaine was placed in the wound and a pin was cut off and was bent and buried beneath the skin. A dry sterile dressing with thumb spica splint was applied. The tourniquet was deflated with good return of blood flow to the hand. The patient was awakened and taken to recovery in good condition.   ____________________________ Park Breed, MD hem:drc D: 12/29/2011 10:52:46 ET T: 12/29/2011 11:13:11 ET JOB#: [id number]  cc: Park Breed, MD, <Dictator> Park Breed MD ELECTRONICALLY SIGNED 12/29/2011 13:09
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
## 2014-07-13 NOTE — Op Note (Signed)
PATIENT NAME:  [name], [name] MR#:  [id number] DATE OF BIRTH:  November 30, 1950  DATE OF PROCEDURE:  11/23/2012  PROCEDURE PERFORMED: Laparoscopic sleeve gastrectomy with repair of hiatal hernia.   PHYSICIAN IN ATTENDANCE: Legrand Como A. Duke Salvia, M.D.   ASSISTANT:  Link Snuffer, PA  PREOPERATIVE DIAGNOSIS: Morbid obesity with a BMI greater than 45, associated comorbidities of hypertension, diabetes and obstructive sleep apnea, presence of hiatal hernia with gastroesophageal reflux disease.   POSTOPERATIVE DIAGNOSIS: Morbid obesity with a BMI greater than 45, associated comorbidities of hypertension, diabetes and obstructive sleep apnea, presence of hiatal hernia with gastroesophageal reflux disease.   PROCEDURE IN DETAIL: The patient was brought to the operating room and placed in the supine position. General anesthesia was obtained with orotracheal intubation. The patient had a Foley catheter inserted. TED hose and Thromboguards were applied. A foot board applied at the end of the operative bed. The patient's chest and abdomen were sterilely prepped and draped. A 5 mm Optiview trocar was introduced under direct visualization in the left upper quadrant of the abdomen. Pneumoperitoneum (Dictation Anomaly) <<established>  carbon dioxide. Three additional trocars were then introduced across the upper abdomen. A Nathanson liver retractor introduced through a subxiphoid wound. Upon elevation of the left lobe liver there was noted to be a moderate fat pad in the region of the GE junction. This included full visualization of the hiatus. The fat pad was placed on downward traction and was mobilized from the anterior stomach wall by use of the Harmonic scalpel. It was also freed from the undersurface of the left hemidiaphragm with use of the Harmonic scalpel. This fat pad was excised from the abdominal cavity at this time. The hiatal hernia noted on prior upper gastrointestinal series identified. The patient had division of  the hepatic ligament by use of the Harmonic scalpel. The intact peritoneum across the anterior hiatus was also divided by use of the Harmonic scalpel. Blunt dissection was used to reduce the herniated peritoneum and also separate the anterior esophagus away from the overlying pericardium. Division of the peritoneum was performed just lateral to the right crus, and blunt dissection used to reduce herniated lesser sac fatty tissue. Mobilization of the distal esophagus was then performed sweeping away from the right pleural by use of the Harmonic scalpel and blunt dissection, posterior mobilization away from the aorta was performed in similar fashion. The patient then had division of several phrenoesophageal ligaments along the margin of the left crus. This was then followed by additional mobilization of the lymphovascular attachments to the lateral aspect of the esophagus on both the right and left side. This was extended into the lower mediastinum over a distance of approximately 6 to 7 cm resulting in delivery of approximately 1.5 cm of esophagus lying comfortably in the abdominal cavity. Two interrupted 0 Ethibond sutures placed posteriorly to approximate the crus. Next,  the pylorus was identified. 4 cm proximal to this, several gastric vascular pedicles divided by use of the Harmonic scalpel and peritoneal attachments to the pancreas divided in similar fashion. This was performed proximally over a distance of approximately 6 cm, at which point and ViSiGi  device was directed into the level of the antrum. This was then used as a template for creation of a medially based gastric tube. A green load stapler was placed initially and directed in a relative transverse direction in an effort to avoid narrowing in the region of the incisura. Next, a more vertical line of staples were placed parallel to  the ViSiGi device, also paralleling the lesser curvature of the stomach. The staple line being brought out just lateral  to the angle of His, leaving a small residual dog ear of at this point. The patient had ViSiGi device partially withdrawn and then and insufflation with carbon dioxide performed with the stomach occluded distally. No evidence of air leak noted with a saline bath. The stomach was decompressed and the dilator withdrawn. The patient then had approximation of the divided aspects of the gastrosplenic and portions of the gastrocolic ligament to the lateral aspects of the sleeve was done to fix the sleeve and the stomach and reduce symptoms for reherniation to the lower mediastinum. This was also done in an effort to avoid any rotation of the sleeve beyond the smooth consistent staple line that developed. At this time the lateral stomach delivered through the 12 mm right upper abdominal trocar defect. Following removal, the staple line was inspected for hemostasis and found to be adequate. Pneumoperitoneum relieved, the trocars were removed. The wound was closed with 4-0 Monocryl in the dermis followed by Dermabond. The patient taken to recovery, having tolerated the procedure well with estimated blood loss 25 to 50 mL.    ____________________________ Venia Carbon. Duke Salvia, MD mat:cc D: 11/23/2012 22:52:08 ET T: 11/23/2012 23:27:46 ET JOB#: [id number]  cc: Legrand Como A. Duke Salvia, MD, <Dictator> [name], MD Ladora Daniel MD ELECTRONICALLY SIGNED 11/29/2012 10:26

## 2014-07-14 NOTE — Consult Note (Signed)
CHIEF COMPLAINT and HISTORY:  Subjective/Chief Complaint BLE ulcerations   History of Present Illness Asked to see patient regarding BLE ulcerations and cellulitis.  Started about 3 weeks ago and did have two long car trips prior to her developing these ulcers.  has a history of RLE DVT many years ago.  Has undergone batiatric surgery with good results so far.  Has lost about 100 pounds and this has helped the pain and swelling in her legs.  Ulcers have improved but not resolved with ABx and local dressing/wound care and she is being discharged today. The pain is better but not entirely resolved.  We are asked to see her to set up outpatient arterial and venous work up.   PAST MEDICAL/SURGICAL HISTORY:  Past Medical History:   cellulitis left leg:    depression/anxiety:    CPOD:    OSA:    peripheral neuropathy:    tendonitis:    DVT:    hypothyroidism:    DM  Type II:    HTN:    Other, see comments:    Left foot surgery: 10-Jan-2010   Left knee replacement: 19-Sep-2009   kidney stones with lithotripsy x 4:    colonoscopies with multiple polypectomy:    hysterectomy:    hernia repair:   ALLERGIES:  Allergies:  Penicillin: Hives  Terramycin: Hives  ASA: Hives  Morphine: Alt Ment Status, GI Distress  Tape: Itching  HOME MEDICATIONS:  Home Medications: Medication Instructions Status  Adoxa 100 mg oral tablet 1 tab(s) orally 2 times a day x 7 days Active  acetaminophen-HYDROcodone 325 mg-5 mg oral tablet 1 tab(s) orally every 6 hours, As Needed, pain , As needed, pain Active  fluticasone-salmeterol 250 mcg-50 mcg inhalation powder 1 puff(s) inhaled 2 times a day Active  levothyroxine 200 mcg (0.2 mg) oral tablet 1 tab(s) orally  Active  Cymbalta 60 mg oral delayed release capsule 1 cap(s) orally once a day (at bedtime) at 8pm Active  Singulair 10 mg oral tablet 1 tab(s) orally once a day (in the evening) Active  Wellbutrin XL 300 mg/24 hours oral tablet,  extended release 1 tab(s) orally every 24 hours in the morning Active  Myrbetriq 25 mg oral tablet, extended release 1 tab(s) orally once a day (in the morning) Active  metFORMIN 500 mg oral tablet 1 tab(s) orally once a day (at bedtime) Active  ALPRAZolam 0.5 mg oral tablet 1 tab(s) orally 2 times a day, As Needed Active  gabapentin 600 mg oral tablet 2 tab(s) orally 4 times a day Active  zolpidem 12.5 mg oral tablet, extended release 1 tab(s) orally once a day (at bedtime) Active  Pradaxa 150 mg oral capsule 1 cap(s) orally 2 times a day Active  Dexilant 60 mg oral delayed release capsule 1 cap(s) orally once a day Active  metFORMIN 1000 mg oral tablet 1 tab(s) orally once a day (in the morning) Active  quinapril 20 mg oral tablet 1 tab(s) orally once a day Active   Family and Social History:  Family History Non-Contributory   Social History negative tobacco, negative ETOH   Place of Living Home   Review of Systems:  Subjective/Chief Complaint LE ulcers LE pain and swelling, improving weight loss (intentional)   Fever/Chills Yes   Cough No   Sputum No   Abdominal Pain No   Diarrhea No   Constipation No   Nausea/Vomiting No   SOB/DOE No   Chest Pain No   Telemetry Reviewed NSR  Dysuria No   Tolerating PT Yes   Tolerating Diet Yes   Medications/Allergies Reviewed Medications/Allergies reviewed   Physical Exam:  GEN well developed, well nourished   HEENT pink conjunctivae, moist oral mucosa   NECK No masses  trachea midline   RESP normal resp effort  no use of accessory muscles   CARD regular rate  no JVD   VASCULAR ACCESS none   ABD denies tenderness  soft   GU no superpubic tenderness   LYMPH negative neck, negative axillae   EXTR negative cyanosis/clubbing, positive edema, her swelling is mild at this point.  Her ulcers are mid calf and are dressed.  2+ left DP, 1+ left PT.  1+ right DP and PT   SKIN positive ulcers, skin turgor good    NEURO cranial nerves intact, follows commands, motor/sensory function intact   PSYCH alert, A+O to time, place, person   LABS:  Laboratory Results: Hepatic:    06-Jul-15 15:40, Comprehensive Metabolic Panel  Bilirubin, Total 0.3  Alkaline Phosphatase 102  45-117  NOTE: New Reference Range  02/10/13  SGPT (ALT) 27  SGOT (AST) 24  Total Protein, Serum 7.5  Albumin, Serum 3.7  TDMs:    08-Jul-15 13:39, Vancomycin, Trough LAB  Vancomycin, Trough LAB 22    08-Jul-15 20:28, Vancomycin, Trough LAB  Vancomycin, Trough LAB 15  Result(s) reported on 27 Sep 2013 at 09:19PM.  Routine Micro:    06-Jul-15 19:15, Blood Culture  Micro Text Report   BLOOD CULTURE    COMMENT                   NO GROWTH IN 48 HOURS     ANTIBIOTIC  Culture Comment   NO GROWTH IN 48 HOURS   Result(s) reported on 27 Sep 2013 at 07:00PM.    06-Jul-15 19:16, Wound Aerobic Culture  Micro Text Report   WOUND AEROBIC CULTURE    COMMENT                   NO GROWTH IN 3 DAYS    GRAM STAIN                NO WHITE BLOOD CELLS    GRAM STAIN                NO ORGANISMS SEEN     ANTIBIOTIC  Specimen Source rt leg  Culture Comment   NO GROWTH IN 3 DAYS  Gram Stain 1   NO WHITE BLOOD CELLS  Gram Stain 2   NO ORGANISMS SEEN   Result(s) reported on 28 Sep 2013 at 10:35AM.  Micro Text Report   WOUND AEROBIC CULTURE    COMMENT                   NO GROWTH IN 3 DAYS    GRAM STAIN                NO WHITE BLOOD CELLS    GRAM STAIN                NO ORGANISMS SEEN     ANTIBIOTIC  Specimen Source left leg  Culture Comment   NO GROWTH IN 3 DAYS  Gram Stain 1   NO WHITE BLOOD CELLS  Gram Stain 2   NO ORGANISMS SEEN   Result(s) reported on 28 Sep 2013 at 10:34AM.    06-Jul-15 20:29, Blood Culture  Micro Text Report   BLOOD CULTURE  COMMENT                   NO GROWTH IN 48 HOURS     ANTIBIOTIC  Culture Comment   NO GROWTH IN 48 HOURS   Result(s) reported on 27 Sep 2013 at 08:00PM.  Routine Chem:     06-Jul-15 15:40, Comprehensive Metabolic Panel  Glucose, Serum 106  BUN 16  Creatinine (comp) 0.87  Sodium, Serum 139  Potassium, Serum 4.5  Chloride, Serum 103  CO2, Serum 31  Calcium (Total), Serum 9.0  Osmolality (calc) 279  eGFR (African American) >60  eGFR (Non-African American) >60  eGFR values <1m/min/1.73 m2 may be an indication of chronic  kidney disease (CKD).  Calculated eGFR is useful in patients with stable renal function.  The eGFR calculation will not be reliable in acutely ill patients  when serum creatinine is changing rapidly. It is not useful in   patients on dialysis. The eGFR calculation may not be applicable  to patients at the low and high extremes of body sizes, pregnant  women, and vegetarians.  Anion Gap 5    07-Jul-15 056:31 Basic Metabolic Panel (w/Total Calcium)  Glucose, Serum 134  BUN 16  Creatinine (comp) 0.90  Sodium, Serum 140  Potassium, Serum 3.9  Chloride, Serum 105  CO2, Serum 29  Calcium (Total), Serum 8.4  Anion Gap 6  Osmolality (calc) 283  eGFR (African American) >60  eGFR (Non-African American) >60  eGFR values <662mmin/1.73 m2 may be an indication of chronic  kidney disease (CKD).  Calculated eGFR is useful in patients with stable renal function.  The eGFR calculation will not be reliable in acutely ill patients  when serum creatinine is changing rapidly. It is not useful in   patients on dialysis. The eGFR calculation may not be applicable  to patients at the low and high extremes of body sizes, pregnant  women, and vegetarians.    08-Jul-15 13:39, Vancomycin, Trough LAB  Result Comment   [facility] - RESULTS VERIFIED BY REPEAT TESTING.   - NOTIFIED OF CRITICAL VALUE   - Notified JaVioleta Gelinas 1435 on   - 09/27/13.SFJ   - READ-BACK PROCESS PERFORMED.   Result(s) reported on 27 Sep 2013 at 02:38PM.    08-Jul-15 20:28, Creatinine Serum  Creatinine (comp) 0.84  eGFR (African American) >60  eGFR (Non-African American)  >60  eGFR values <6066min/1.73 m2 may be an indication of chronic  kidney disease (CKD).  Calculated eGFR is useful in patients with stable renal function.  The eGFR calculation will not be reliable in acutely ill patients  when serum creatinine is changing rapidly. It is not useful in   patients on dialysis. The eGFR calculation may not be applicable  to patients at the low and high extremes of body sizes, pregnant  women, and vegetarians.  Routine Hem:    06-Jul-15 15:40, Hemogram, Platelet Count  WBC (CBC) 7.8  RBC (CBC) 4.26  Hemoglobin (CBC) 13.0  Hematocrit (CBC) 38.5  Platelet Count (CBC) 285  Result(s) reported on 25 Sep 2013 at 03:55PM.  MCV 91  MCH 30.5  MCHC 33.7  RDW 13.4    07-Jul-15 04:28, CBC Profile  WBC (CBC) 5.6  RBC (CBC) 3.73  Hemoglobin (CBC) 11.3  Hematocrit (CBC) 33.9  Platelet Count (CBC) 212  MCV 91  MCH 30.2  MCHC 33.4  RDW 13.5  Neutrophil % 61.4  Lymphocyte % 22.0  Monocyte % 9.3  Eosinophil % 6.1  Basophil % 1.2  Neutrophil # 3.4  Lymphocyte # 1.2  Monocyte # 0.5  Eosinophil # 0.3  Basophil # 0.1  Result(s) reported on 26 Sep 2013 at 05:38AM.    09-Jul-15 05:10, CBC Profile  WBC (CBC) 6.5  RBC (CBC) 4.08  Hemoglobin (CBC) 12.5  Hematocrit (CBC) 38.2  Platelet Count (CBC) 256  MCV 94  MCH 30.8  MCHC 32.9  RDW 13.4  Neutrophil % 61.7  Lymphocyte % 22.4  Monocyte % 8.2  Eosinophil % 6.6  Basophil % 1.1  Neutrophil # 4.0  Lymphocyte # 1.4  Monocyte # 0.5  Eosinophil # 0.4  Basophil # 0.1  Result(s) reported on 28 Sep 2013 at 06:51AM.   RADIOLOGY:  Radiology Results: XRay:    20-Aug-14 10:41, Upper GI With Barium Swallow  Upper GI With Barium Swallow  REASON FOR EXAM:    dysphagia and GERD  COMMENTS:       PROCEDURE: FL  - FL UPPER GI W/ BARIUM SWALLOW  - Nov 09 2012 10:41AM     RESULT: Single contrast barium swallow and upper GI exam is performed. No   gas producing crystals were available and therefore the study is  somewhat   limited with regards to the mucosa. The patient easily ingested a liquid   barium which passed through the esophagus to the stomach. There is no   esophageal stenosis. There is moderate prominence of the cricopharyngeus  causing approximately 50% narrowing from the dorsal aspect of the upper   esophagus. No hiatal hernia was identified. The patient ingested a 12.5   mm barium-impregnated tablet which passed through the esophagus into the   stomach without delay. The stomach is nondistended. No gross gastric   mucosal abnormalities appreciated. Contrast flows into normal appearing     proximal small bowel. No gastroesophageal reflux could be demonstrated   with reflux maneuvers. The patient was unable to tolerate prone RAO   swallow.    IMPRESSION:   1. Mild prominence of the cricopharyngeus muscle narrowing the upper   esophagus.  2. Other findings as discussed above.    Dictation Site: 1        Verified By: Sundra Aland, M.D., MD    03-Sep-14 12:29, Upper GI  Upper GI  REASON FOR EXAM:    evaluate for leak s/p sleeve gastrectomy  COMMENTS:       PROCEDURE: FL  - FL UPPER GI  - Nov 23 2012 12:29PM     RESULT: Limited Gastrografin upper GI is performed following gastric   surgery. Gastrografin flows through the esophagus into the stomach and   quickly into the small bowel without evidence of extravasation or   abnormal collection to suggest extravasation. The if the patient develops   symptoms concerning for perforation or extravasation then further   followup with CT would be suggested.    IMPRESSION:  Please see above.    Dictation Site: 1    Verified By: Sundra Aland, M.D., MD  LabUnknown:    20-Aug-14 10:41, Upper GI With Barium Swallow  PACS Image    03-Sep-14 12:29, Upper GI  PACS Image    18-Nov-14 16:06, Screening Digital Mammogram  PACS Image    19-May-15 08:04, TEE  PACS [place]:    18-Nov-14 16:06, Screening Digital  Mammogram  Screening Digital Mammogram  REASON FOR EXAM:    SCR MAMMO NO ORDER  COMMENTS:       PROCEDURE: MAM - MAM DGTL SCRN MAM  NO ORDER W/CAD  - Feb 07 2013  4:06PM     CLINICAL DATA:  Screening.    EXAM:  DIGITAL SCREENING BILATERAL MAMMOGRAM WITH CAD    COMPARISON:  Previous exam(s).    ACR Breast Density Category b: There are scattered areas of  fibroglandular density.  FINDINGS:  There are no findings suspicious for malignancy. Images were  processed with CAD.     IMPRESSION:  No mammographic evidence of malignancy. A result letter of this  screening mammogram will be mailed directly to the patient.    RECOMMENDATION:  Screening mammogram in one year. (Code:SM-B-01Y)    BI-RADS CATEGORY  1: Negative      Electronically Signed    By: Lillia Mountain M.D.    On: 02/08/2013 08:32         Verified By: Mordecai Rasmussen L. ARCEO, M.D.,   ASSESSMENT AND PLAN:  Assessment/Admission Diagnosis BLE ulcerations, likely venous stasis variety Hx DVT RLE other comorbidities as above   Plan Agree with outpatient work up for arterial and venous disease of the LE to assess for causative/contributing factors.  Suspect venous disease and would benefit from elevation and compression I suspect.  Will do ABIs and venous duplex as outpatient.   level 4   Electronic Signatures: [name] (MD)  (Signed 09-Jul-15 10:57)  Authored: Chief Complaint and History, PAST MEDICAL/SURGICAL HISTORY, ALLERGIES, HOME MEDICATIONS, Family and Social History, Review of Systems, Physical Exam, LABS, RADIOLOGY, Assessment and Plan   Last Updated: 09-Jul-15 10:57 by [name] (MD)
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
## 2014-07-15 NOTE — Op Note (Signed)
PATIENT NAME:  [name], WARTH MR#:  [id number] DATE OF BIRTH:  April 30, 1950  DATE OF PROCEDURE:  07/28/2011  PREOPERATIVE DIAGNOSIS: Chronic cholecystitis.   POSTOPERATIVE DIAGNOSIS: Chronic cholecystitis.   PROCEDURE: Laparoscopic cholecystectomy, cholangiogram.   SURGEON: Loreli Dollar, MD   ANESTHESIA: General.   INDICATIONS: This 64 year old female has a history of epigastric pains and abnormally low gallbladder ejection fraction of 22% and surgery was recommended for definitive treatment.   DESCRIPTION OF PROCEDURE: The patient was placed on the operating table in the supine position under general endotracheal anesthesia. The abdomen was very large and was prepared with ChloraPrep and draped in a sterile manner.   A supraumbilical incision was made just approximately 5 cm superior to the umbilicus and carried down to the deep fascia which was grasped with laryngeal hook and elevated. A Veress needle was inserted into the peritoneal cavity, aspirated, and irrigated with a saline solution. Next, the 10 mm cannula was inserted. The 10 mm 0 degree laparoscope was inserted to view the peritoneal cavity. This was beneath the omentum and the scope was manipulated to bring it out from under the omentum and view the liver. It did have an appearance of a fatty liver. The patient was placed in the reverse Trendelenburg position and turned a few degrees to the left. Another incision was made in the epigastrium just to the right of the midline to introduce an 11 mm cannula. Two incisions were made in the lateral aspect of the right upper quadrant to introduce two 5-mm cannulas.   The gallbladder was grasped and retracted towards the right shoulder. Multiple adhesions were taken down with blunt and sharp dissection. Next, another incision was made in the left paramedian area to insert an 11 mm cannula and a five finger fan retractor which was used to retract the transverse colon and omentum for exposure.  Further dissection was carried down to the infundibulum. The pouch of Morison was retracted inferiorly and laterally. The porta hepatis was demonstrated. The cystic artery was dissected free from surrounding structures and follow its course up onto the gallbladder. It appeared that the cystic artery was in the way of further significant dissection and, therefore, the cystic artery was divided between endoclips. This allowed further exposure of the cystic duct which was dissected free from surrounding structures. The neck of the gallbladder was mobilized with incision of visceral peritoneum and subsequently a critical view of safety was demonstrated. An Endoclip was placed across the cystic duct adjacent to the neck of the gallbladder. An incision was made in the cystic duct to introduce a Reddick catheter. Half-strength Conray-60 dye was injected as the cholangiogram was done with fluoroscopy viewing the biliary tree and prompt flow of dye into the duodenum. No retained stones were seen. The Reddick catheter was removed. The cystic duct was doubly ligated with endoclips and divided. The gallbladder was dissected free from the liver with blunt and sharp dissection and also use of electrocautery. Gallbladder was completely separated. Hemostasis was intact. The site was irrigated with heparinized saline solution and aspirated. Gallbladder was delivered out through the supraumbilical port. It was opened and suctioned and with some manipulation was completely removed and submitted in formalin for routine pathology. The right upper quadrant was further inspected. Hemostasis was intact. The cannulas were removed. Carbon dioxide was allowed to escape from the peritoneal cavity. The fascial defect at the supraumbilical incision was closed with a 0 Vicryl figure-of-eight suture. The supraumbilical incision was closed with a single  4-0 chromic suture. However, there appeared to be quite a bit of tension on this and it was  further repaired with interrupted 4-0 nylon simple sutures. The other four incisions were closed with 4-0 and 5-0 chromic subcuticular sutures, benzoin, and Steri-Strips. Dressings were applied with paper tape. The patient tolerated surgery satisfactorily and was then prepared for transfer to the recovery room.    ____________________________ Lenna Sciara. Rochel Brome, MD jws:drc D: 07/28/2011 09:31:53 ET T: 07/28/2011 11:35:25 ET JOB#: [id number]  cc: Loreli Dollar, MD, <Dictator> Loreli Dollar MD ELECTRONICALLY SIGNED 07/28/2011 21:17

## 2014-08-24 ENCOUNTER — Other Ambulatory Visit: Payer: Self-pay | Admitting: Urology

## 2014-08-24 ENCOUNTER — Other Ambulatory Visit: Payer: Self-pay | Admitting: Family Medicine

## 2014-08-24 DIAGNOSIS — J4540 Moderate persistent asthma, uncomplicated: Secondary | ICD-10-CM

## 2014-08-30 ENCOUNTER — Telehealth: Payer: Self-pay

## 2014-08-30 DIAGNOSIS — E1142 Type 2 diabetes mellitus with diabetic polyneuropathy: Secondary | ICD-10-CM

## 2014-08-30 DIAGNOSIS — J4540 Moderate persistent asthma, uncomplicated: Secondary | ICD-10-CM

## 2014-08-30 DIAGNOSIS — E039 Hypothyroidism, unspecified: Secondary | ICD-10-CM

## 2014-08-30 DIAGNOSIS — E1151 Type 2 diabetes mellitus with diabetic peripheral angiopathy without gangrene: Secondary | ICD-10-CM

## 2014-08-30 DIAGNOSIS — K219 Gastro-esophageal reflux disease without esophagitis: Secondary | ICD-10-CM

## 2014-08-30 NOTE — Telephone Encounter (Signed)
I agree that patient should find out if any weight loss medications are covered by her insurance.

## 2014-08-30 NOTE — Telephone Encounter (Signed)
Spoke with patient about new [employer] will not cover Belviq, patient asked if we could switch medication to something that is covered. Informed patient to call Insurance and ask what would be covered by them and call us back with medications names.

## 2014-08-31 DIAGNOSIS — E1151 Type 2 diabetes mellitus with diabetic peripheral angiopathy without gangrene: Secondary | ICD-10-CM

## 2014-08-31 DIAGNOSIS — J4540 Moderate persistent asthma, uncomplicated: Secondary | ICD-10-CM | POA: Insufficient documentation

## 2014-08-31 DIAGNOSIS — E1142 Type 2 diabetes mellitus with diabetic polyneuropathy: Secondary | ICD-10-CM | POA: Insufficient documentation

## 2014-08-31 DIAGNOSIS — E039 Hypothyroidism, unspecified: Secondary | ICD-10-CM | POA: Insufficient documentation

## 2014-08-31 DIAGNOSIS — J45909 Unspecified asthma, uncomplicated: Secondary | ICD-10-CM | POA: Insufficient documentation

## 2014-08-31 HISTORY — DX: Type 2 diabetes mellitus with diabetic peripheral angiopathy without gangrene: E11.51

## 2014-08-31 MED ORDER — PANTOPRAZOLE SODIUM 40 MG PO TBEC: 40.0000 mg | DELAYED_RELEASE_TABLET | Freq: Every day | ORAL | Status: DC

## 2014-08-31 NOTE — Telephone Encounter (Signed)
Protonix ordered. She is most welcome.

## 2014-08-31 NOTE — Telephone Encounter (Signed)
Contacted this patient to inform her of the message listed below. She has tried Omeprazole for 90 days and it does not work. She states that she keeps a book with a list of medication she has tried, but wants [place] to call her back with the alternatives that they are suggesting so she can tell us if she has tried it or not. I told her that I would pass the message on to Dr. Nadine Counts and then I will give her a call back.   She also made mention that she does not like mail order and prefers to use her local pharmacy ([facility])

## 2014-08-31 NOTE — Telephone Encounter (Signed)
Please contact Mrs Piotrowski again and inform her that her new Medicare insurance will not cover Dexilant 60mg  which she is on. Do I have permission to switch it to an alternative medication on their formulary? Send it to local pharmacy or which mail order pharmacy?

## 2014-08-31 NOTE — Telephone Encounter (Signed)
Patient stated that she will try the Protonix and she stated, "please tell her thanks for being so wonderful."

## 2014-08-31 NOTE — Telephone Encounter (Signed)
The letter I received did not mention which medications they would cover as an alternative for Dexilant. If she would prefer for me to send this medication to her local pharmacy as a 90day supply I can try that first before we switch to something other than Omeprazole if that has not worked in the past. Other option would be Protonix.

## 2014-09-07 ENCOUNTER — Other Ambulatory Visit: Payer: Self-pay | Admitting: Obstetrics and Gynecology

## 2014-09-07 NOTE — Telephone Encounter (Signed)
Can pt have refill?  

## 2014-09-08 ENCOUNTER — Other Ambulatory Visit: Payer: Self-pay | Admitting: Family Medicine

## 2014-09-09 NOTE — Telephone Encounter (Signed)
Patient will need to have her primary care physician refill her Wellbutrin.

## 2014-09-10 ENCOUNTER — Other Ambulatory Visit: Payer: Self-pay | Admitting: Family Medicine

## 2014-09-10 DIAGNOSIS — F418 Other specified anxiety disorders: Secondary | ICD-10-CM

## 2014-09-10 NOTE — Telephone Encounter (Signed)
Spoke with pt who stated she was needing a refill and PA on myrbetriq not wellbutrin. Made pt aware we would not be able to refill wellbutrin.

## 2014-09-17 ENCOUNTER — Other Ambulatory Visit: Payer: Self-pay

## 2014-09-18 ENCOUNTER — Ambulatory Visit: Payer: Self-pay | Admitting: Family Medicine

## 2014-09-19 ENCOUNTER — Ambulatory Visit: Payer: Self-pay | Admitting: Family Medicine

## 2014-09-20 ENCOUNTER — Telehealth: Payer: Self-pay | Admitting: *Deleted

## 2014-09-20 NOTE — Telephone Encounter (Signed)
Spoke with pt and made aware nurse does not have PA for myrbetriq. Pt stated husband brought PA paper over June 10. Front desk states they do not have PA paperwork either. Per [name] nurse gave pt 2 weeks worth of myrbetriq samples. Pt stated she would have pharmacy fax over PA again. Cw,lpn

## 2014-09-20 NOTE — Telephone Encounter (Signed)
Pt called to see about the update on the prior auth for her medication Myrbetriq. She states that she has tried to call several times and failed to get through . She has 10 pills left. She is requesting a call back.

## 2014-09-21 ENCOUNTER — Ambulatory Visit (INDEPENDENT_AMBULATORY_CARE_PROVIDER_SITE_OTHER): Payer: PPO | Admitting: Family Medicine

## 2014-09-21 ENCOUNTER — Other Ambulatory Visit: Payer: Self-pay | Admitting: Family Medicine

## 2014-09-21 ENCOUNTER — Encounter: Payer: Self-pay | Admitting: Family Medicine

## 2014-09-21 VITALS — HR 80 | Temp 98.2°F | Resp 18 | Ht 65.75 in | Wt 251.6 lb

## 2014-09-21 DIAGNOSIS — I739 Peripheral vascular disease, unspecified: Secondary | ICD-10-CM | POA: Insufficient documentation

## 2014-09-21 DIAGNOSIS — L97901 Non-pressure chronic ulcer of unspecified part of unspecified lower leg limited to breakdown of skin: Secondary | ICD-10-CM

## 2014-09-21 DIAGNOSIS — R8299 Other abnormal findings in urine: Secondary | ICD-10-CM | POA: Diagnosis not present

## 2014-09-21 DIAGNOSIS — Z872 Personal history of diseases of the skin and subcutaneous tissue: Secondary | ICD-10-CM | POA: Insufficient documentation

## 2014-09-21 DIAGNOSIS — I8312 Varicose veins of left lower extremity with inflammation: Secondary | ICD-10-CM

## 2014-09-21 DIAGNOSIS — F32A Depression, unspecified: Secondary | ICD-10-CM | POA: Insufficient documentation

## 2014-09-21 DIAGNOSIS — F418 Other specified anxiety disorders: Secondary | ICD-10-CM | POA: Insufficient documentation

## 2014-09-21 DIAGNOSIS — I999 Unspecified disorder of circulatory system: Secondary | ICD-10-CM | POA: Insufficient documentation

## 2014-09-21 DIAGNOSIS — I10 Essential (primary) hypertension: Secondary | ICD-10-CM | POA: Diagnosis not present

## 2014-09-21 DIAGNOSIS — IMO0002 Reserved for concepts with insufficient information to code with codable children: Secondary | ICD-10-CM | POA: Insufficient documentation

## 2014-09-21 DIAGNOSIS — Z1239 Encounter for other screening for malignant neoplasm of breast: Secondary | ICD-10-CM | POA: Diagnosis not present

## 2014-09-21 DIAGNOSIS — E1151 Type 2 diabetes mellitus with diabetic peripheral angiopathy without gangrene: Secondary | ICD-10-CM

## 2014-09-21 DIAGNOSIS — B372 Candidiasis of skin and nail: Secondary | ICD-10-CM | POA: Insufficient documentation

## 2014-09-21 DIAGNOSIS — I8311 Varicose veins of right lower extremity with inflammation: Secondary | ICD-10-CM

## 2014-09-21 DIAGNOSIS — N898 Other specified noninflammatory disorders of vagina: Secondary | ICD-10-CM | POA: Insufficient documentation

## 2014-09-21 DIAGNOSIS — Z9889 Other specified postprocedural states: Secondary | ICD-10-CM | POA: Insufficient documentation

## 2014-09-21 DIAGNOSIS — R140 Abdominal distension (gaseous): Secondary | ICD-10-CM | POA: Insufficient documentation

## 2014-09-21 DIAGNOSIS — I872 Venous insufficiency (chronic) (peripheral): Secondary | ICD-10-CM

## 2014-09-21 DIAGNOSIS — E1159 Type 2 diabetes mellitus with other circulatory complications: Secondary | ICD-10-CM | POA: Diagnosis not present

## 2014-09-21 DIAGNOSIS — E039 Hypothyroidism, unspecified: Secondary | ICD-10-CM | POA: Diagnosis not present

## 2014-09-21 DIAGNOSIS — R82998 Other abnormal findings in urine: Secondary | ICD-10-CM | POA: Insufficient documentation

## 2014-09-21 DIAGNOSIS — Z9181 History of falling: Secondary | ICD-10-CM | POA: Insufficient documentation

## 2014-09-21 DIAGNOSIS — Z86718 Personal history of other venous thrombosis and embolism: Secondary | ICD-10-CM | POA: Insufficient documentation

## 2014-09-21 DIAGNOSIS — E119 Type 2 diabetes mellitus without complications: Secondary | ICD-10-CM | POA: Insufficient documentation

## 2014-09-21 DIAGNOSIS — L239 Allergic contact dermatitis, unspecified cause: Secondary | ICD-10-CM | POA: Insufficient documentation

## 2014-09-21 DIAGNOSIS — Z13820 Encounter for screening for osteoporosis: Secondary | ICD-10-CM

## 2014-09-21 DIAGNOSIS — G5793 Unspecified mononeuropathy of bilateral lower limbs: Secondary | ICD-10-CM | POA: Insufficient documentation

## 2014-09-21 DIAGNOSIS — K219 Gastro-esophageal reflux disease without esophagitis: Secondary | ICD-10-CM | POA: Insufficient documentation

## 2014-09-21 DIAGNOSIS — N189 Chronic kidney disease, unspecified: Secondary | ICD-10-CM | POA: Insufficient documentation

## 2014-09-21 DIAGNOSIS — G4700 Insomnia, unspecified: Secondary | ICD-10-CM | POA: Insufficient documentation

## 2014-09-21 DIAGNOSIS — F419 Anxiety disorder, unspecified: Secondary | ICD-10-CM

## 2014-09-21 DIAGNOSIS — F329 Major depressive disorder, single episode, unspecified: Secondary | ICD-10-CM | POA: Insufficient documentation

## 2014-09-21 LAB — POCT URINALYSIS DIPSTICK
BILIRUBIN UA: NEGATIVE
Glucose, UA: NEGATIVE
Ketones, UA: NEGATIVE
LEUKOCYTES UA: NEGATIVE
NITRITE UA: NEGATIVE
PH UA: 6
Protein, UA: NEGATIVE
RBC UA: NEGATIVE
SPEC GRAV UA: 1.02
Urobilinogen, UA: 0.2

## 2014-09-21 NOTE — Progress Notes (Signed)
Name: [name]   MRN: [id number]    DOB: 11-23-1950   Date:09/21/2014       Progress Note  Subjective  Chief Complaint  Chief Complaint  Patient presents with  . Follow-up    patient is here for a 31-month f/u  . Rash    back area that is very itchy. Questions contact dermatitis.  . Sore    patient has bilateral leg sores.  . Referral    patient wants a mammorgram and dexa scan    HPI  Patient is here for routine follow up of Hypertension. First diagnosed with hypertension several years ago. Current anti-hypertension medication regimen includes dietary modification, weight management and quinapril 20mg . Patient is following physician recommended management. Not checking blood pressure outside of physician office.  Associated symptoms do not include headache, dizziness, nausea, lower extremity swelling, shortness of breath, chest pain. Related medical issues include CKD stage II, Diabetes Type II, Peripheral vascular disease.  Patient is here for routine follow up of Diabetes Type II.  Current diabetes medication regimen includes Metformin 1000 mg po bid. Patient is taking medications as instructed. Overall the patient feels that their blood glucose is well controlled. Not checking home blood glucose. She is checking her feet daily and sees a podiatry specialist as well. Has diabetic foot wear. Complications from diabetes include peripheral circulatory issues, neuropathy, CKD stage II, propensity for cellulitis infections. Currently is experiencing some open wounds on her bilateral legs.   Rash: Patient complains of rash involving the trunk. Rash started several weeks ago. Appearance of rash at onset: Texture of lesion(s): raised. Rash has not changed over time Initial distribution: trunk.  Discomfort associated with rash: is pruritic.  Associated symptoms: none. Denies: none. Patient has not had previous evaluation of rash. Patient has had previous treatment.  Response to treatment:  steroid topical cream has worked reasonably well in the past. Patient has not had contacts with similar rash. Patient has not identified precipitant. Patient has not had new exposures (soaps, lotions, laundry detergents, foods, medications, plants, insects or animals.)  Depression: Worsening due to recent familial and financial stressors in life. Denies suicidal ideations.    Past Medical History  Diagnosis Date  . Sinus problem   . Swelling   . Thyroid disease   . Diabetes   . Venous stasis dermatitis of both lower extremities   . Hx of blood clots   . HBP (high blood pressure)   . Kidney disease   . Depression   . Dysthymic disorder   . Paroxysmal atrial fibrillation 2007    In June 2015, Cardiac Event Monitor: Mostly SR/sinus arrhythmia with PVCs that are frequent. Short bursts of A. fib lasting several minutes  . Asthma   . GERD (gastroesophageal reflux disease)   . Heart murmur     Echocardiogram June 2015: Mild MR (possible vegetation seen on TTE, not seen on TEE), normal LV size with moderate concentric LVH. Normal function EF 60-65%. Normal diastolic function. Mild LA dilation.  . DVT (deep venous thrombosis)   . Insomnia   . Atrial fibrillation, controlled   . Cystocele   . Neuropathy involving both lower extremities   . Anxiety and depression   . Obesity, Class II, BMI 35-39.9, with comorbidity     Past Surgical History  Procedure Laterality Date  . Hernia repair    . Ankle surgery Right   . Vaginal hysterectomy    . Sleeve gastroplasty    . Lithotripsy    .  Kidney surgery    . Transthoracic echocardiogram  08/03/2013    Mild-Moderate concentric LVH, EF 60-65%. Normal diastolic function. Mild LA dilation. Mild MR with possible vegetation  - not confirmed on TEE   . Transesophageal echocardiogram  08/08/2013    Mild LVH, EF 60-65%. Moderate LA dilation and mild RA dilation. Mild MR with no evidence of stenosis and no evidence of endocarditis. A false chordae is  noted.    Family History  Problem Relation Age of Onset  . Skin cancer Father   . Diabetes Father   . Hypertension Father   . Peripheral vascular disease Father   . Cancer Father   . Cerebral aneurysm Father   . Varicose Veins Mother   . Kidney disease Mother   . Mental illness Sister   . Cancer Maternal Aunt     breast    History   Social History  . Marital Status: Married    Spouse Name: N/A  . Number of Children: N/A  . Years of Education: N/A   Occupational History  . Not on file.   Social History Main Topics  . Smoking status: Never Smoker   . Smokeless tobacco: Never Used  . Alcohol Use: No  . Drug Use: No  . Sexual Activity: Not on file   Other Topics Concern  . Not on file   Social History Narrative   She is currently married -- for 28 years. Does not work. Does not smoke or take alcohol. She never smoked. She exercises at least 3 days a week since before her gastric surgery.   Marital status reviewed in history of present illness.     Current outpatient prescriptions:  .  ALPRAZolam (XANAX) 0.5 MG tablet, Take 1 tablet by mouth 2 (two) times daily as needed., Disp: , Rfl:  .  buPROPion (WELLBUTRIN XL) 300 MG 24 hr tablet, Take 1 tablet (300 mg total) by mouth daily., Disp: 30 tablet, Rfl: 1 .  dabigatran (PRADAXA) 150 MG CAPS capsule, Take 150 mg by mouth 2 (two) times daily., Disp: , Rfl:  .  DULoxetine (CYMBALTA) 60 MG capsule, TAKE 1 CAPSULE EVERY DAY, Disp: 30 capsule, Rfl: 5 .  estradiol (ESTRACE VAGINAL) 0.1 MG/GM vaginal cream, Place 1 application vaginally 2 (two) times a week., Disp: , Rfl:  .  Fluticasone-Salmeterol (ADVAIR) 250-50 MCG/DOSE AEPB, Inhale 2 puffs into the lungs 2 (two) times daily., Disp: , Rfl:  .  levothyroxine (SYNTHROID, LEVOTHROID) 150 MCG tablet, Take 150 mcg by mouth daily before breakfast., Disp: , Rfl:  .  Lorcaserin HCl (BELVIQ) 10 MG TABS, Take 1 tablet by mouth 2 (two) times daily., Disp: , Rfl:  .  meloxicam (MOBIC)  7.5 MG tablet, Take 7.5 mg by mouth daily., Disp: , Rfl:  .  metFORMIN (GLUCOPHAGE) 1000 MG tablet, Take 1 tablet by mouth 2 (two) times daily., Disp: , Rfl:  .  montelukast (SINGULAIR) 10 MG tablet, TAKE ONE TABLET EVERY DAY, Disp: 90 tablet, Rfl: 2 .  MYRBETRIQ 25 MG TB24 tablet, TAKE ONE TABLET EVERY DAY, Disp: 30 tablet, Rfl: 3 .  pantoprazole (PROTONIX) 40 MG tablet, Take 1 tablet (40 mg total) by mouth daily., Disp: 90 tablet, Rfl: 2 .  quinapril (ACCUPRIL) 20 MG tablet, Take 20 mg by mouth at bedtime., Disp: , Rfl:  .  zolpidem (AMBIEN CR) 12.5 MG CR tablet, Take 12.5 mg by mouth at bedtime as needed for sleep., Disp: , Rfl:   Allergies  Allergen Reactions  .  Aspirin Hives  . Penicillins Hives  . Terramycin [Oxytetracycline] Hives  . Other Rash    pineapple     ROS  CONSTITUTIONAL: No significant weight changes, fever, chills, weakness or fatigue.  HEENT:  - Eyes: No visual changes.  - Ears: No auditory changes. No pain.  - Nose: No sneezing, congestion, runny nose. - Throat: No sore throat. No changes in swallowing. SKIN: Yes rash or itching.  CARDIOVASCULAR: No chest pain, chest pressure or chest discomfort. No palpitations or edema.  RESPIRATORY: No shortness of breath, cough or sputum.  GASTROINTESTINAL: No anorexia, nausea, vomiting. No changes in bowel habits. No abdominal pain or blood.  GENITOURINARY: No dysuria. No frequency. No discharge.  NEUROLOGICAL: No headache, dizziness, syncope, paralysis, ataxia, numbness or tingling in the extremities. No memory changes. No change in bowel or bladder control.  MUSCULOSKELETAL: No joint pain. No muscle pain. HEMATOLOGIC: No anemia, bleeding or bruising.  LYMPHATICS: No enlarged lymph nodes.  PSYCHIATRIC: Yes change in mood. No change in sleep pattern.  ENDOCRINOLOGIC: No reports of sweating, cold or heat intolerance. No polyuria or polydipsia.   Objective  Filed Vitals:   09/21/14 1515  Pulse: 80  Temp: 98.2 F  (36.8 C)  TempSrc: Oral  Resp: 18  Height: 5' 5.75" (1.67 m)  Weight: 251 lb 9.6 oz (114.125 kg)  SpO2: 97%   Body mass index is 40.92 kg/(m^2).  Depression screen PHQ 2/9 09/21/2014  Decreased Interest 1  Down, Depressed, Hopeless 1  PHQ - 2 Score 2  Altered sleeping 0  Tired, decreased energy 0  Change in appetite 0  Feeling bad or failure about yourself  1  Trouble concentrating 2  Moving slowly or fidgety/restless 0  Suicidal thoughts 0  PHQ-9 Score 5  Difficult doing work/chores Somewhat difficult      Physical Exam  Constitutional: Patient is obese and well-nourished. In no distress.  HEENT:  - Head: Normocephalic and atraumatic.  - Ears: Bilateral TMs gray, no erythema or effusion - Nose: Nasal mucosa moist - Mouth/Throat: Oropharynx is clear and moist. No tonsillar hypertrophy or erythema. No post nasal drainage.  - Eyes: Conjunctivae clear, EOM movements normal. PERRLA. No scleral icterus.  Neck: Normal range of motion. Neck supple. No JVD present. No thyromegaly present.  Cardiovascular: Normal rate, regular rhythm and normal heart sounds.  No murmur heard.  Pulmonary/Chest: Effort normal and breath sounds normal. No respiratory distress. Musculoskeletal: Normal range of motion bilateral UE and LE, no joint effusions. Peripheral vascular: Bilateral LE no edema, thinning of skin with 1+ (diminished) pedal pulses. Over bilateral mid tibial region 0.57mm ulcer w/o surrounding skin erythema or tenderness. Neurological: CN II-XII grossly intact with no focal deficits. Alert and oriented to person, place, and time. Coordination, balance, strength, speech and gait are normal.  Skin: Skin is warm and dry. Middle of back behind bra line rough patch w/o vesicles or raised lesions Psychiatric: Patient has a normal mood and affect. Behavior is normal in office today. Judgment and thought content normal in office today.   Assessment & Plan  1. Hypertension goal BP (blood  pressure) < 150/90 Well controled continue current regimen.  - CBC with Differential/Platelet - Comprehensive metabolic panel  2. DM (diabetes mellitus) type II controlled peripheral vascular disorder Due for routine blood work.   - Lipid panel - HgB A1c  3. Venous stasis dermatitis of both lower extremities Needs close monitoring, recommended topical triple abx with daily compression hose wearing.  4. Screening for osteoporosis  -  DG Bone Density; Future  5. Breast cancer screening  - MM Digital Screening; Future  6. Dark urine Udip unremarkable   - POCT Urinalysis Dipstick  7. Hypothyroidism, adult Due for blood work.  - TSH - T3, free - T4, free  8. Ulcer of lower extremity, unspecified laterality, limited to breakdown of skin Needs close monitoring, recommended topical triple abx with daily compression hose wearing. No signs or symptoms of infection today.

## 2014-09-23 ENCOUNTER — Telehealth: Payer: Self-pay | Admitting: Urology

## 2014-09-23 NOTE — Telephone Encounter (Signed)
Patient's insurance is requesting that she try the medications listed on their formulary first before they will pay for Myrbetriq.  I need to know what those medications are.

## 2014-09-24 IMAGING — MR MR ABDOMEN WO/W CM
12 series · 48 of 48 positions shown · IV contrast (multihance)
Comparison: none

REASON FOR EXAM: adrenal masses
COMMENTS:

PROCEDURE:     MMR - MMR ABDOMEN WO/W CONTRAST  - September 07, 2012  [DATE]
RESULT:     Comparison: CT of the abdomen and pelvis 08/23/2012, 11/08/2006
TECHNIQUE: Standard abdominal MRI protocol, before and after the
administration of 20 mL Multihance intravenous contrast.

[Series 2: T2 · axial · 8.0mm · 1.00mm/px · z∈[+50,+155]mm · 3 of 39 slices shown]
[im 1/39]
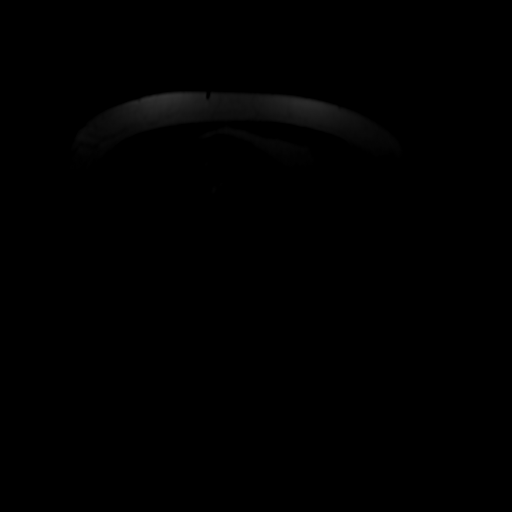
[im 20/39]
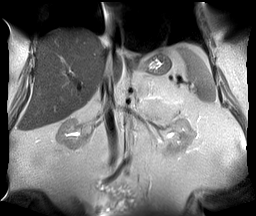
[im 39/39]
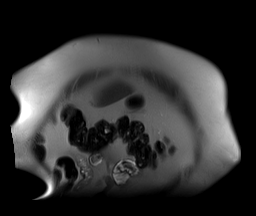

[Series 3: T1 · axial · 8.0mm · 1.00mm/px · z∈[+50,+185]mm · 3 of 39 slices shown (1 of 3)]
[im 1/39]
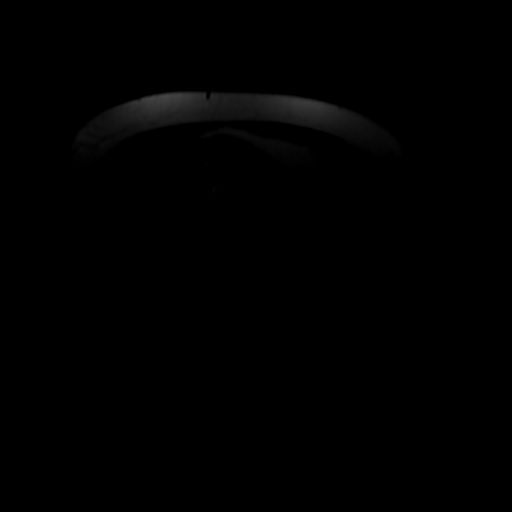
[im 20/39]
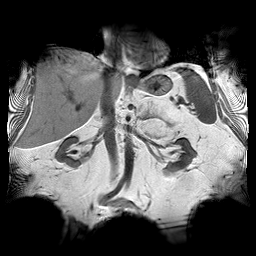
[im 39/39]
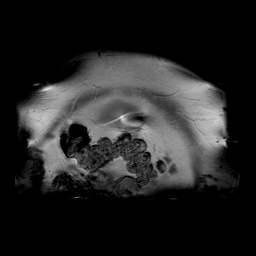

[Series 5: T2 fat-sat · axial · 6.0mm · 1.38mm/px · z∈[-160,+187]mm · 2 of 42 slices shown]
[im 1/42]
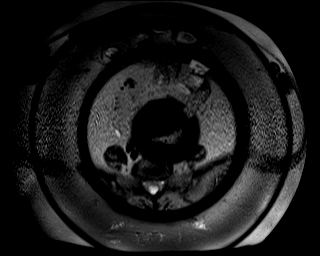
[im 42/42]
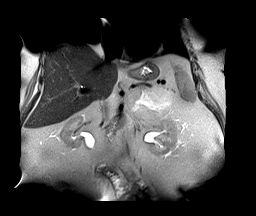

[Series 8: T1 dynamic · axial · 3.5mm · 1.38mm/px · z∈[-151,+186]mm · 5 of 81 slices shown (1 of 2)]
[im 1/81]
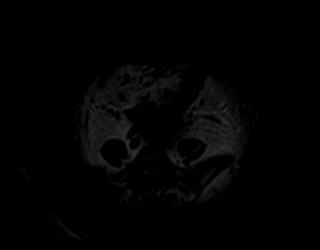
[im 21/81]
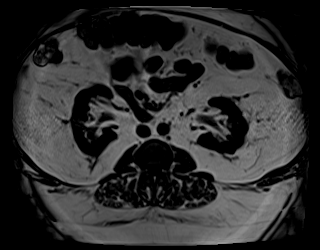
[im 41/81]
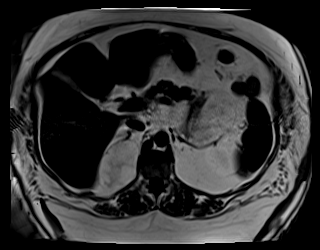
[im 61/81]
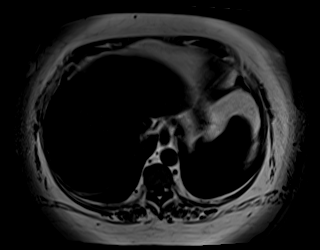
[im 81/81]
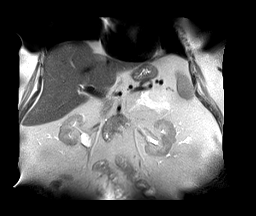

[Series 9: T1 dynamic · axial · 3.5mm · 1.38mm/px · z∈[-151,+186]mm · 5 of 81 slices shown (2 of 2)]
[im 1/81]
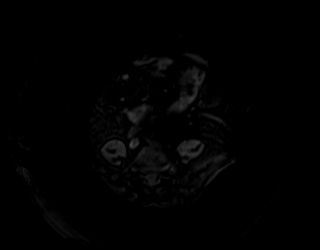
[im 21/81]
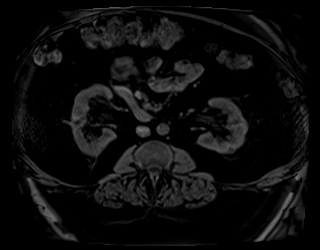
[im 41/81]
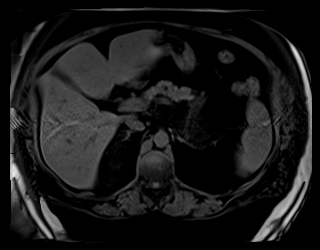
[im 61/81]
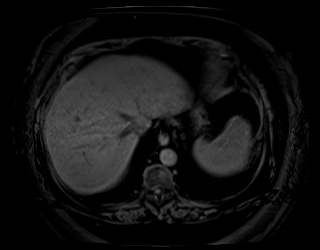
[im 81/81]
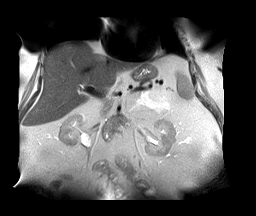

[Series 10: T1 dynamic fat-sat · axial · 3.5mm · 1.38mm/px · z∈[-171,+190]mm · 5 of 89 slices shown (1 of 5)]
[im 1/89]
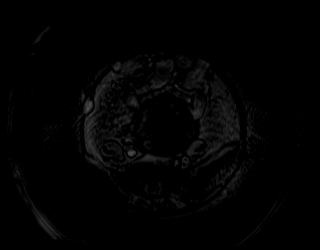
[im 23/89]
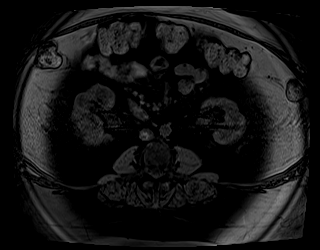
[im 45/89]
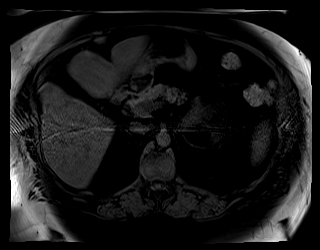
[im 67/89]
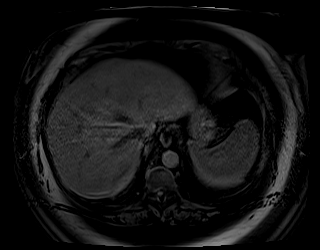
[im 89/89]
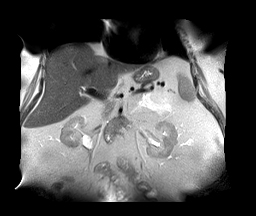

[Series 11: T1 dynamic fat-sat · axial · 3.5mm · 1.41mm/px · z∈[-171,+195]mm · 5 of 89 slices shown (2 of 5)]
[im 1/89]
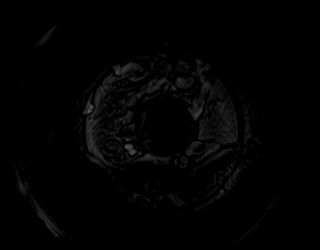
[im 23/89]
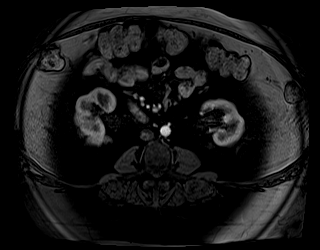
[im 45/89]
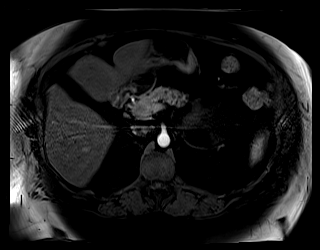
[im 67/89]
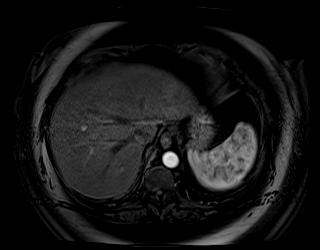
[im 89/89]
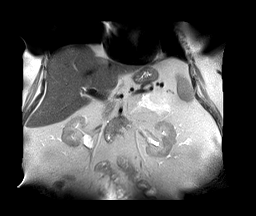

[Series 12: T1 dynamic fat-sat · axial · 3.5mm · 1.41mm/px · z∈[-171,+195]mm · 5 of 89 slices shown (3 of 5)]
[im 1/89]
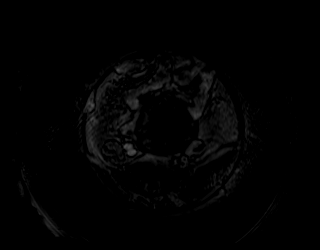
[im 23/89]
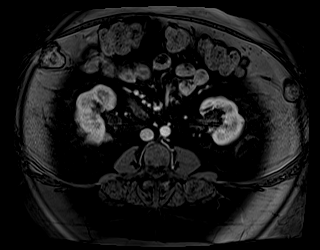
[im 45/89]
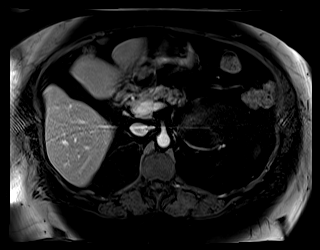
[im 67/89]
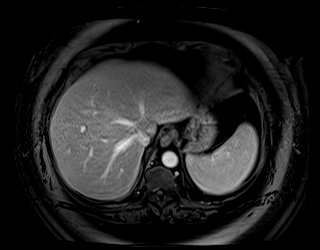
[im 89/89]
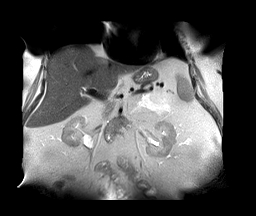

[Series 13: T1 dynamic fat-sat · axial · 3.5mm · 1.41mm/px · z∈[-171,+195]mm · 5 of 89 slices shown (4 of 5)]
[im 1/89]
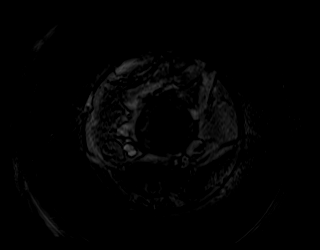
[im 23/89]
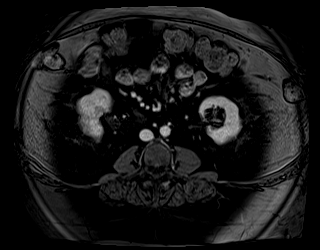
[im 45/89]
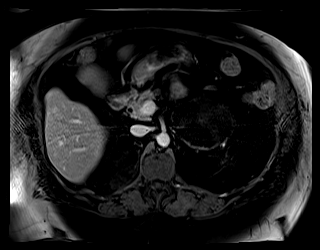
[im 67/89]
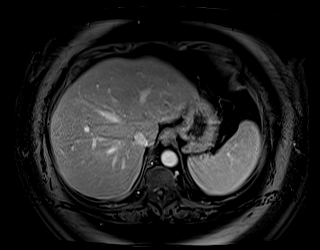
[im 89/89]
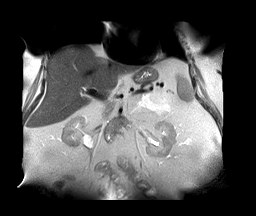

[Series 14: T1 dynamic fat-sat · axial · 8.0mm · 1.00mm/px · z∈[+50,+173]mm · 6 of 97 slices shown (5 of 5)]
[im 1/97]
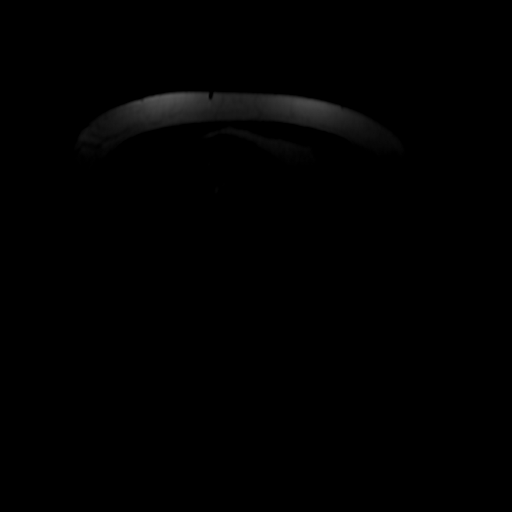
[im 20/97]
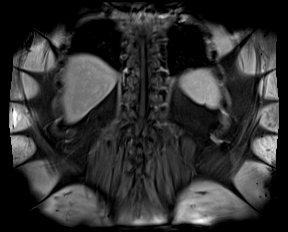
[im 39/97]
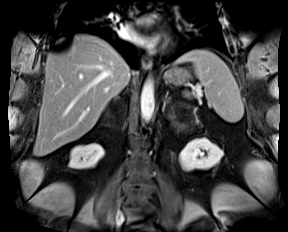
[im 58/97]
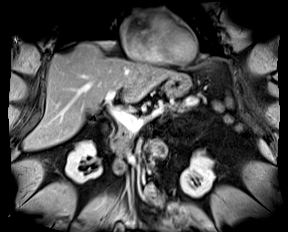
[im 77/97]
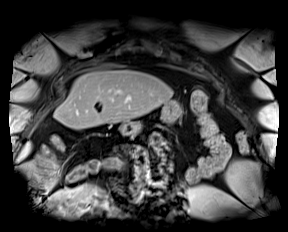
[im 97/97]
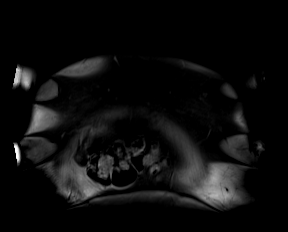

[Series 5003: T1 · axial · 6.0mm · 0.86mm/px · z∈[-160,+192]mm · 2 of 42 slices shown (2 of 3)]
[im 1/42]
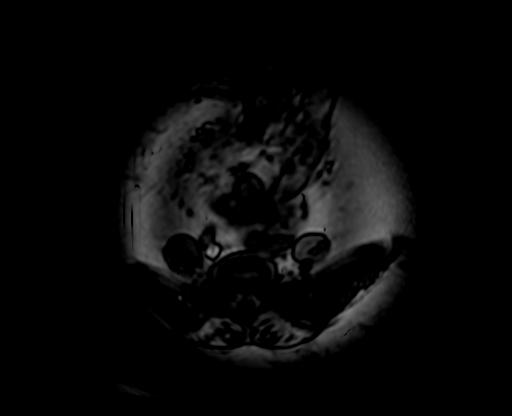
[im 42/42]
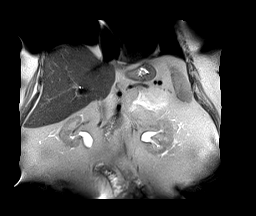

[Series 5005: T1 · axial · 6.0mm · 0.86mm/px · z∈[-160,+192]mm · 2 of 42 slices shown (3 of 3)]
[im 1/42]
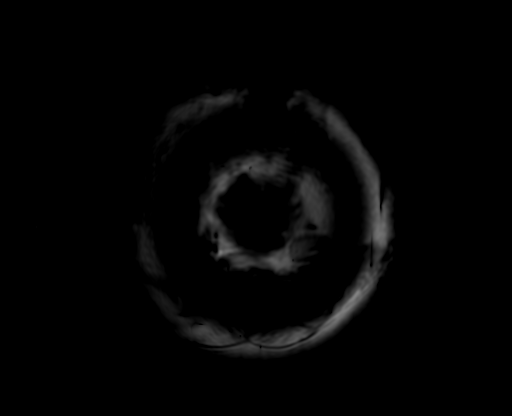
[im 42/42]
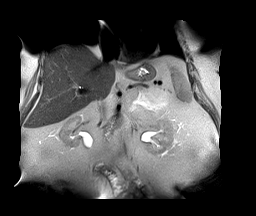

[48 of 48 positions shown; findings below may reference images not displayed]

FINDINGS: There are large masses in the bilateral adrenal glands. The masses contain
macroscopic fat. The right adrenal mass measures 7.3 x 4.9 cm in greatest
axial dimension. The left adrenal mass measures 9.0 x 8.2 cm and greatest
axial dimension. Of the right adrenal mass is increased in size from 1114.
The left adrenal mass is similar to prior. There are does not appear to be
significant areas of enhancement within these masses. Subcentimeter nodule
in the medial limb of the left adrenal gland is too small to characterize,
but similar to priors.

The liver, spleen, and pancreas are unremarkable. There is a small T2
hyperintense structure in the left kidney which is too small to
characterize, but likely represent a cyst.
IMPRESSION: There are large bilateral adrenal masses which contain macroscopic fat.
These most likely represent adrenal myelolipomas. The right adrenal mass has
increased in size from 1114. The left adrenal mass is similar to prior.

## 2014-09-25 NOTE — Telephone Encounter (Signed)
Called insurance co. Waiting for return call/fax

## 2014-09-28 ENCOUNTER — Telehealth: Payer: Self-pay

## 2014-09-28 ENCOUNTER — Other Ambulatory Visit: Payer: Self-pay | Admitting: Bariatrics

## 2014-09-28 DIAGNOSIS — R12 Heartburn: Secondary | ICD-10-CM

## 2014-09-28 NOTE — Telephone Encounter (Signed)
Patient called stating that she needed a copy of her last 2 notes to be faxed to [phone]. She was not sure which of the counselors at the [facility] she will be seeing but their names are Miguel Dibble and Darletta Moll. She stated they were the only ones in [facility] that took her insurance and she really needed to see someone due to her worsening situation at home.  The requested information was printed and faxed. Confirmation was received.

## 2014-10-03 ENCOUNTER — Ambulatory Visit: Payer: Self-pay

## 2014-10-08 ENCOUNTER — Telehealth: Payer: Self-pay | Admitting: Family Medicine

## 2014-10-08 ENCOUNTER — Other Ambulatory Visit: Payer: Self-pay | Admitting: Family Medicine

## 2014-10-08 NOTE — Telephone Encounter (Signed)
Patient was informed and stated if she doesn't get it today then she will get it tomorrow.

## 2014-10-08 NOTE — Telephone Encounter (Signed)
Pt received a letter requesting that she serve for jury duty. She is requesting that you write her a doctor statement stating that she is not able to due to her legs.

## 2014-10-08 NOTE — Telephone Encounter (Signed)
Please let [name] know that her requested letter is ready to be picked up.

## 2014-10-10 ENCOUNTER — Ambulatory Visit
Admission: RE | Admit: 2014-10-10 | Discharge: 2014-10-10 | Disposition: A | Discharge status: Home / Self Care | Payer: PPO | Source: Ambulatory Visit | Attending: Bariatrics | Admitting: Bariatrics

## 2014-10-10 DIAGNOSIS — K449 Diaphragmatic hernia without obstruction or gangrene: Secondary | ICD-10-CM | POA: Diagnosis not present

## 2014-10-10 DIAGNOSIS — R12 Heartburn: Secondary | ICD-10-CM

## 2014-10-10 DIAGNOSIS — K228 Other specified diseases of esophagus: Secondary | ICD-10-CM | POA: Diagnosis not present

## 2014-10-10 LAB — CBC WITH DIFFERENTIAL/PLATELET
Basophils Absolute: 0.1 10*3/uL (ref 0.0–0.2)
Basos: 1 %
EOS (ABSOLUTE): 0.4 10*3/uL (ref 0.0–0.4)
EOS: 6 %
HEMOGLOBIN: 12.5 g/dL (ref 11.1–15.9)
Hematocrit: 36.3 % (ref 34.0–46.6)
Immature Grans (Abs): 0 10*3/uL (ref 0.0–0.1)
Immature Granulocytes: 0 %
Lymphocytes Absolute: 1.2 10*3/uL (ref 0.7–3.1)
Lymphs: 20 %
MCH: 28.7 pg (ref 26.6–33.0)
MCHC: 34.4 g/dL (ref 31.5–35.7)
MCV: 83 fL (ref 79–97)
MONOS ABS: 0.5 10*3/uL (ref 0.1–0.9)
Monocytes: 9 %
Neutrophils Absolute: 3.8 10*3/uL (ref 1.4–7.0)
Neutrophils: 64 %
Platelets: 263 10*3/uL (ref 150–379)
RBC: 4.36 x10E6/uL (ref 3.77–5.28)
RDW: 14.8 % (ref 12.3–15.4)
WBC: 5.9 10*3/uL (ref 3.4–10.8)

## 2014-10-10 LAB — HEMOGLOBIN A1C
Est. average glucose Bld gHb Est-mCnc: 143 mg/dL
HEMOGLOBIN A1C: 6.6 % — AB (ref 4.8–5.6)

## 2014-10-11 ENCOUNTER — Encounter: Payer: Self-pay | Admitting: Family Medicine

## 2014-10-11 LAB — COMPREHENSIVE METABOLIC PANEL
A/G RATIO: 1.8 (ref 1.1–2.5)
ALBUMIN: 4.2 g/dL (ref 3.6–4.8)
ALK PHOS: 88 IU/L (ref 39–117)
ALT: 17 IU/L (ref 0–32)
AST: 22 IU/L (ref 0–40)
BILIRUBIN TOTAL: 0.3 mg/dL (ref 0.0–1.2)
BUN/Creatinine Ratio: 20 (ref 11–26)
BUN: 16 mg/dL (ref 8–27)
CO2: 23 mmol/L (ref 18–29)
Calcium: 9.3 mg/dL (ref 8.7–10.3)
Chloride: 102 mmol/L (ref 97–108)
Creatinine, Ser: 0.79 mg/dL (ref 0.57–1.00)
GFR calc Af Amer: 91 mL/min/{1.73_m2} (ref >59)
GFR calc non Af Amer: 79 mL/min/{1.73_m2} (ref >59)
GLOBULIN, TOTAL: 2.3 g/dL (ref 1.5–4.5)
Glucose: 122 mg/dL — ABNORMAL HIGH (ref 65–99)
Potassium: 4.9 mmol/L (ref 3.5–5.2)
Sodium: 143 mmol/L (ref 134–144)
TOTAL PROTEIN: 6.5 g/dL (ref 6.0–8.5)

## 2014-10-11 LAB — LIPID PANEL
Chol/HDL Ratio: 2 ratio units (ref 0.0–4.4)
Cholesterol, Total: 185 mg/dL (ref 100–199)
HDL: 94 mg/dL (ref >39)
LDL CALC: 73 mg/dL (ref 0–99)
Triglycerides: 89 mg/dL (ref 0–149)
VLDL Cholesterol Cal: 18 mg/dL (ref 5–40)

## 2014-10-11 LAB — T4, FREE: FREE T4: 1.33 ng/dL (ref 0.82–1.77)

## 2014-10-11 LAB — T3, FREE: T3, Free: 2.1 pg/mL (ref 2.0–4.4)

## 2014-10-11 LAB — TSH: TSH: 1.75 u[IU]/mL (ref 0.450–4.500)

## 2014-10-15 ENCOUNTER — Other Ambulatory Visit: Payer: Self-pay | Admitting: Family Medicine

## 2014-10-15 DIAGNOSIS — F418 Other specified anxiety disorders: Secondary | ICD-10-CM

## 2014-10-17 ENCOUNTER — Ambulatory Visit
Admission: RE | Admit: 2014-10-17 | Discharge: 2014-10-17 | Disposition: A | Discharge status: Home / Self Care | Payer: PPO | Source: Ambulatory Visit | Attending: Family Medicine | Admitting: Family Medicine

## 2014-10-17 ENCOUNTER — Encounter: Payer: Self-pay | Admitting: Family Medicine

## 2014-10-17 DIAGNOSIS — Z1231 Encounter for screening mammogram for malignant neoplasm of breast: Secondary | ICD-10-CM | POA: Insufficient documentation

## 2014-10-17 DIAGNOSIS — Z1239 Encounter for other screening for malignant neoplasm of breast: Secondary | ICD-10-CM

## 2014-10-17 DIAGNOSIS — Z13820 Encounter for screening for osteoporosis: Secondary | ICD-10-CM | POA: Insufficient documentation

## 2014-10-18 ENCOUNTER — Telehealth: Payer: Self-pay

## 2014-10-18 NOTE — Telephone Encounter (Signed)
Gerda Diss from [place] called and needs a prior auth [id number]  EOCID# [id number]

## 2014-10-22 ENCOUNTER — Ambulatory Visit: Payer: PPO | Admitting: Licensed Clinical Social Worker

## 2014-10-24 ENCOUNTER — Ambulatory Visit (INDEPENDENT_AMBULATORY_CARE_PROVIDER_SITE_OTHER): Payer: PPO | Admitting: Licensed Clinical Social Worker

## 2014-10-24 DIAGNOSIS — Z630 Problems in relationship with spouse or partner: Secondary | ICD-10-CM | POA: Diagnosis not present

## 2014-10-24 DIAGNOSIS — F32A Depression, unspecified: Secondary | ICD-10-CM

## 2014-10-24 DIAGNOSIS — F329 Major depressive disorder, single episode, unspecified: Secondary | ICD-10-CM

## 2014-10-24 NOTE — Telephone Encounter (Signed)
[name] is not to be done until pt is seen. Pt has an appt on file. Pt is aware of need for visit prior to PA being completed.
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
## 2014-10-25 ENCOUNTER — Encounter: Payer: Self-pay | Admitting: Licensed Clinical Social Worker

## 2014-10-25 DIAGNOSIS — Z630 Problems in relationship with spouse or partner: Secondary | ICD-10-CM | POA: Insufficient documentation

## 2014-10-26 IMAGING — CR DG ABDOMEN 1V
1 series · 3 of 3 positions shown · non-contrast
Comparison: none

REASON FOR EXAM: kidney stone
COMMENTS:

PROCEDURE:     KDR - KDXR KIDNEY URETER BLADDER  - August 08, 2012  [DATE]
RESULT:     Comparison: 08/13/2010

[Series 1: supine kub · 0.17mm/px · 3 of 3 slices shown]
[im 1/3]
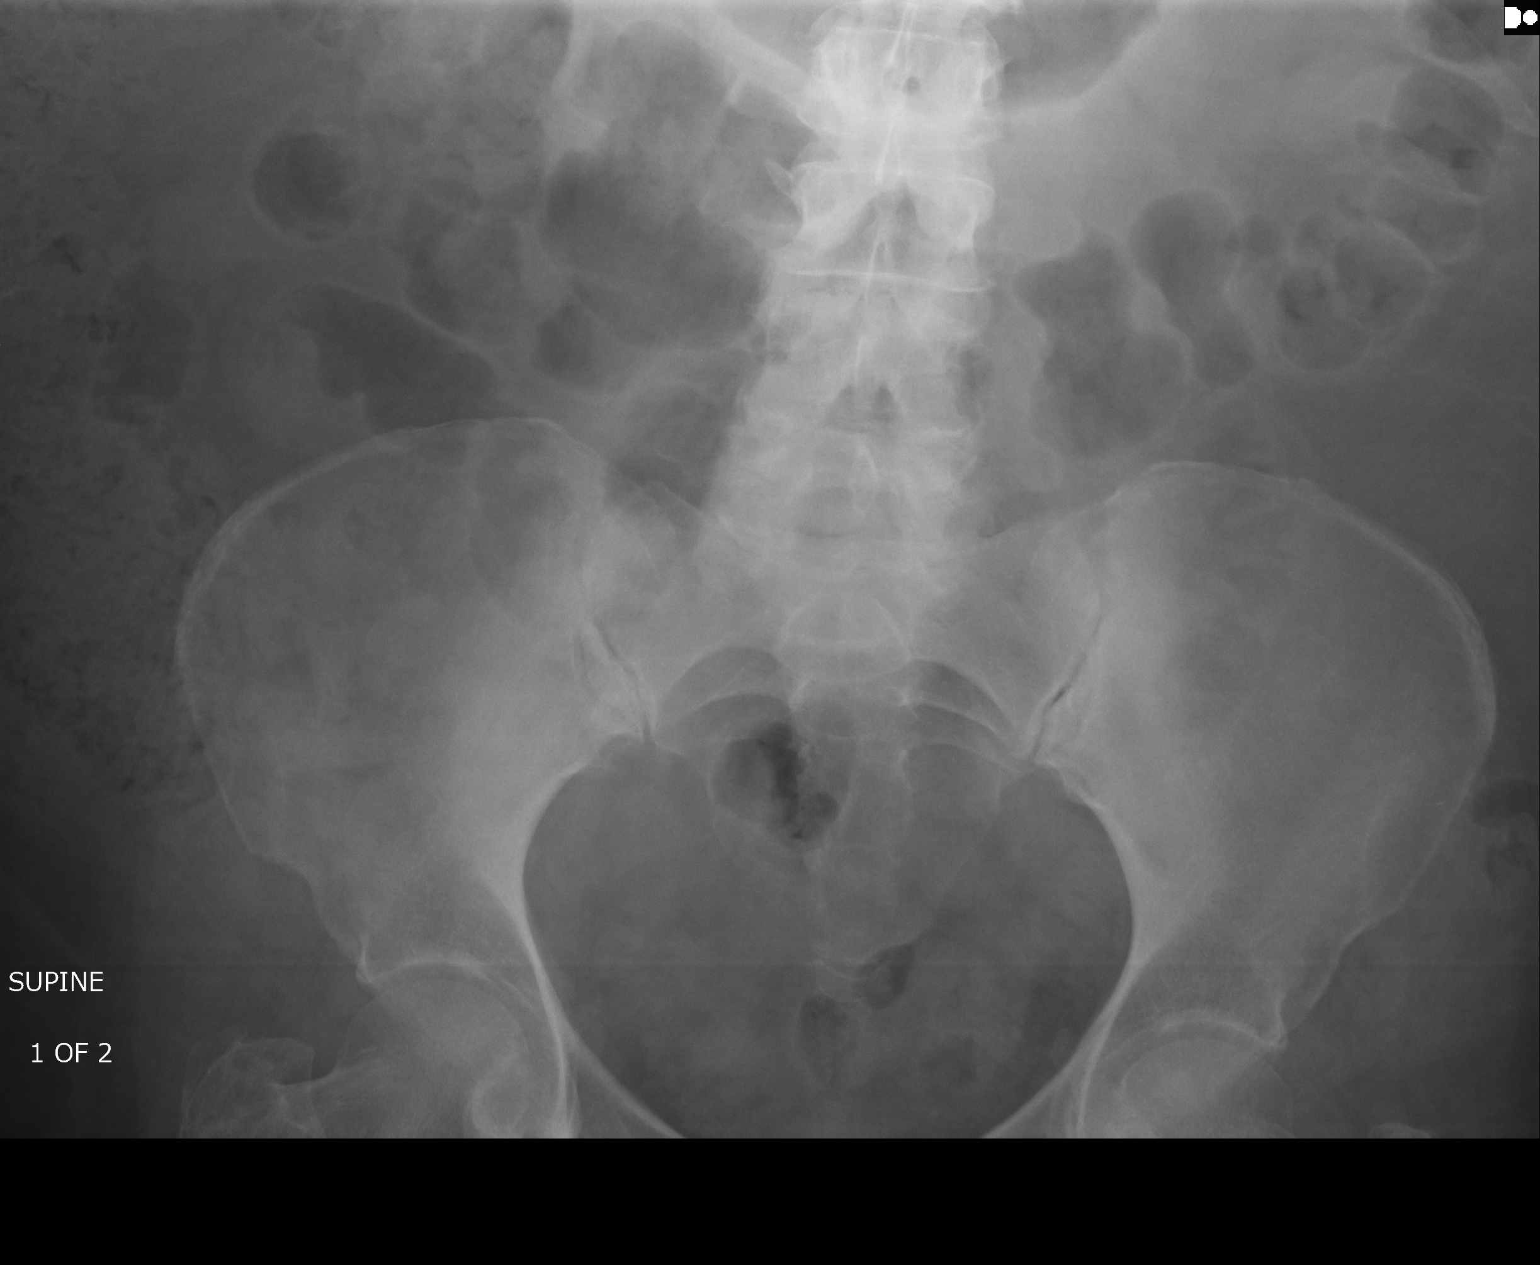
[im 2/3]
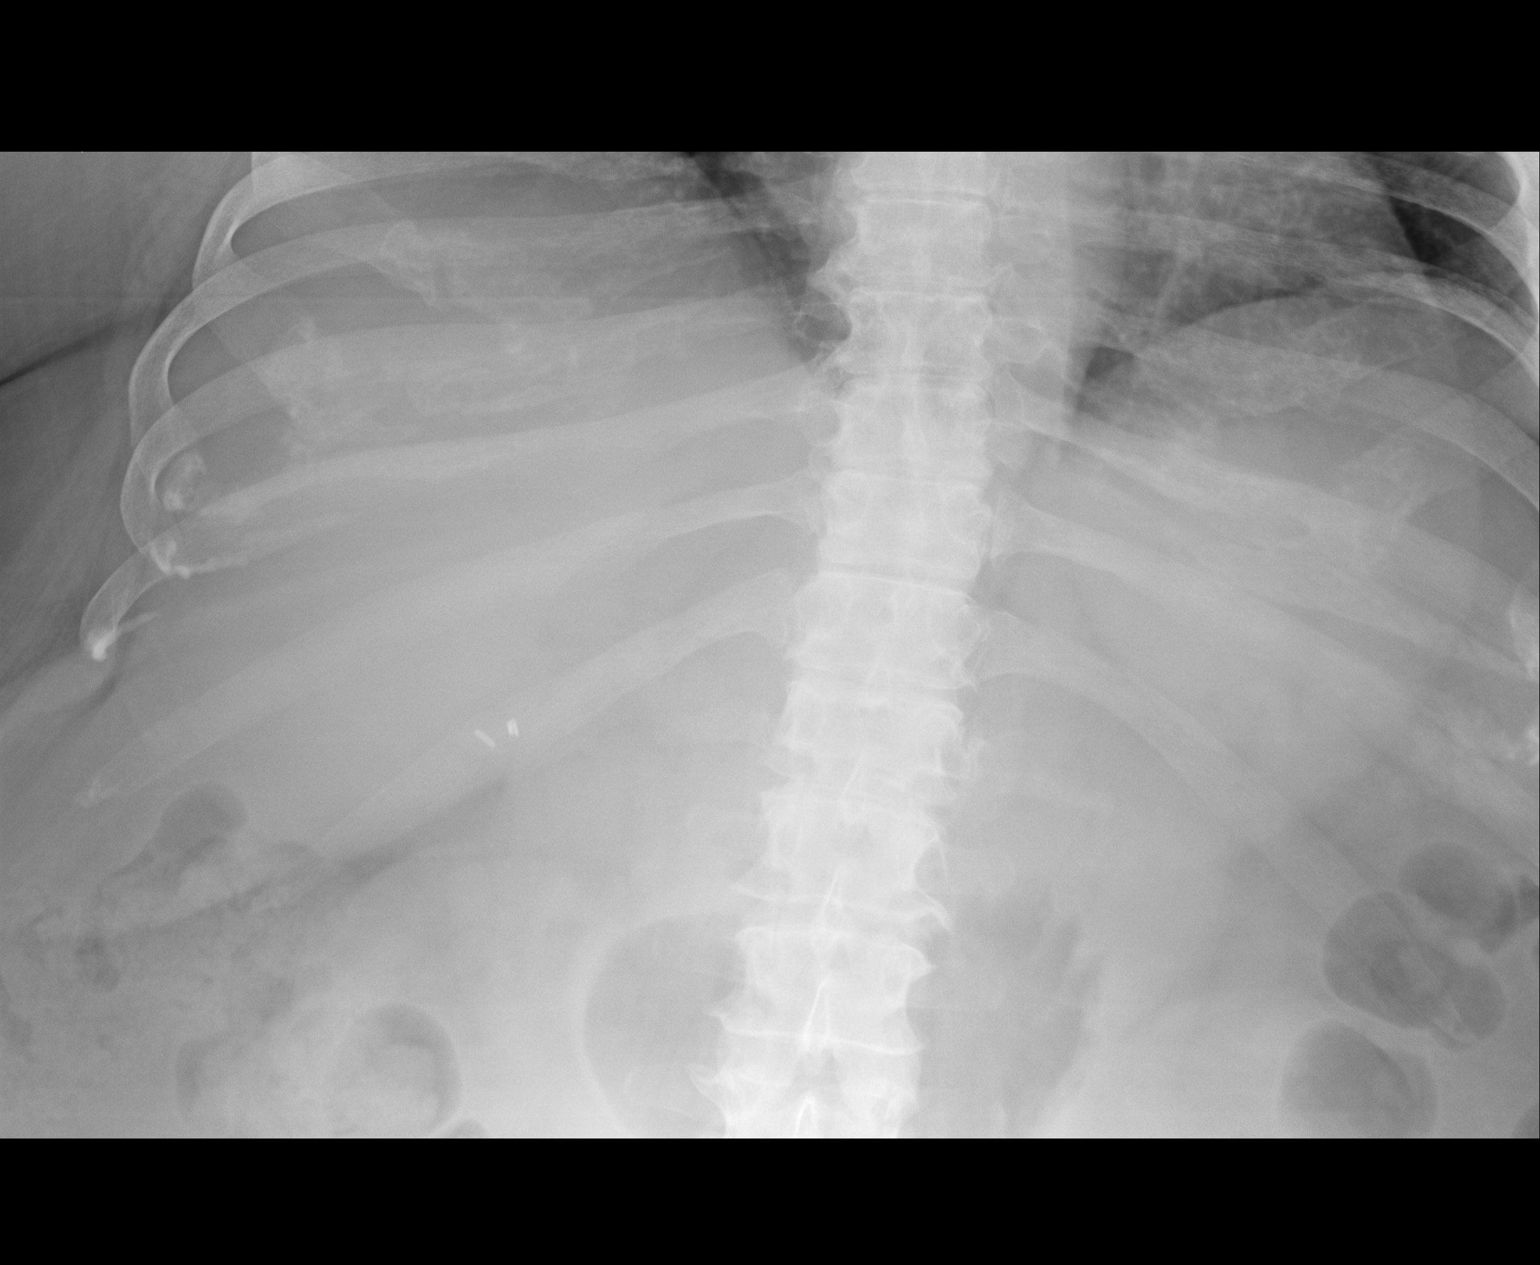
[im 3/3]
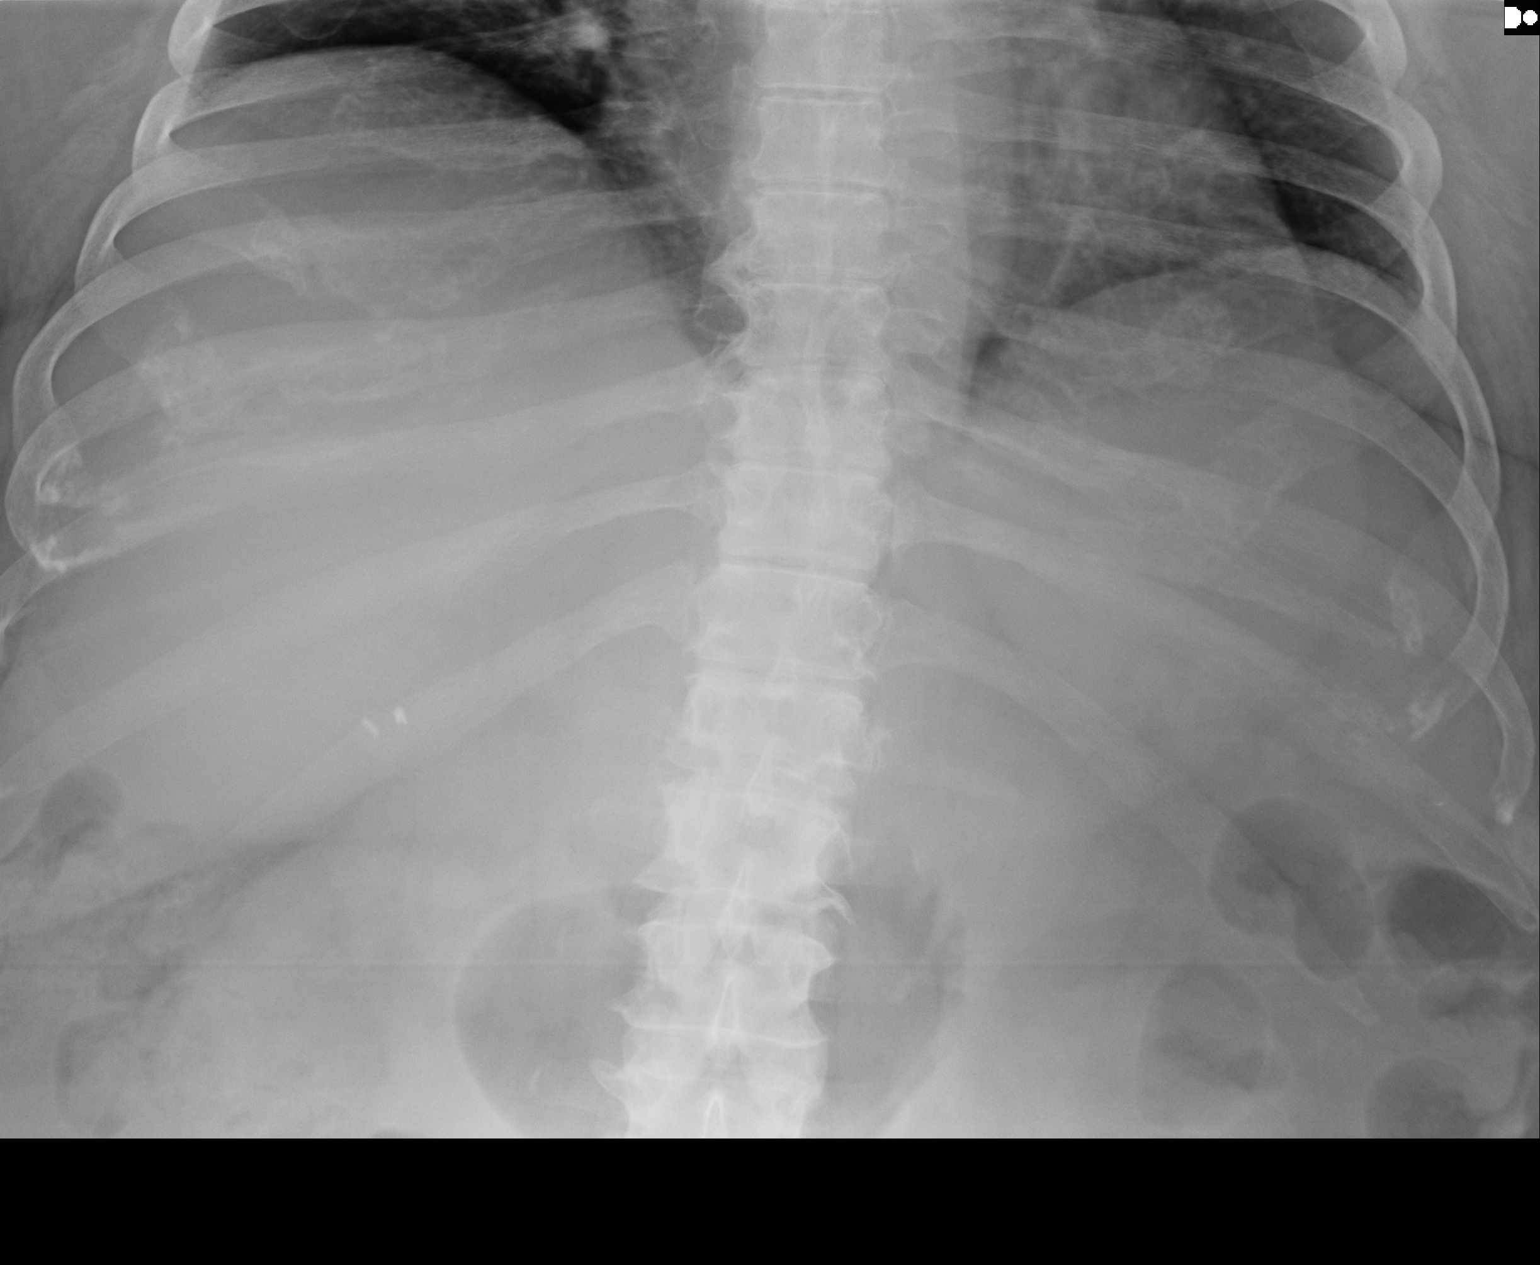

[3 of 3 positions shown; findings below may reference images not displayed]

FINDINGS: Surgical clips are seen from prior cholecystectomy. No definite calculi seen
overlying the kidneys or expected course of the ureters. Nonobstructed bowel
gas pattern.
IMPRESSION: No definite renal calculi.

[REDACTED]

## 2014-10-30 IMAGING — CR DG CHEST 2V
1 series · 2 of 2 positions shown · non-contrast
Comparison: none

REASON FOR EXAM: sleep apnea, DM2, morbid obesity
COMMENTS:

PROCEDURE:     DXR - DXR CHEST PA (OR AP) AND LATERAL  - August 12, 2012 [DATE]
RESULT:     Comparison: None

[Series 2: w chest pa · 0.14mm/px · 2 of 2 slices shown]
[im 1/2]
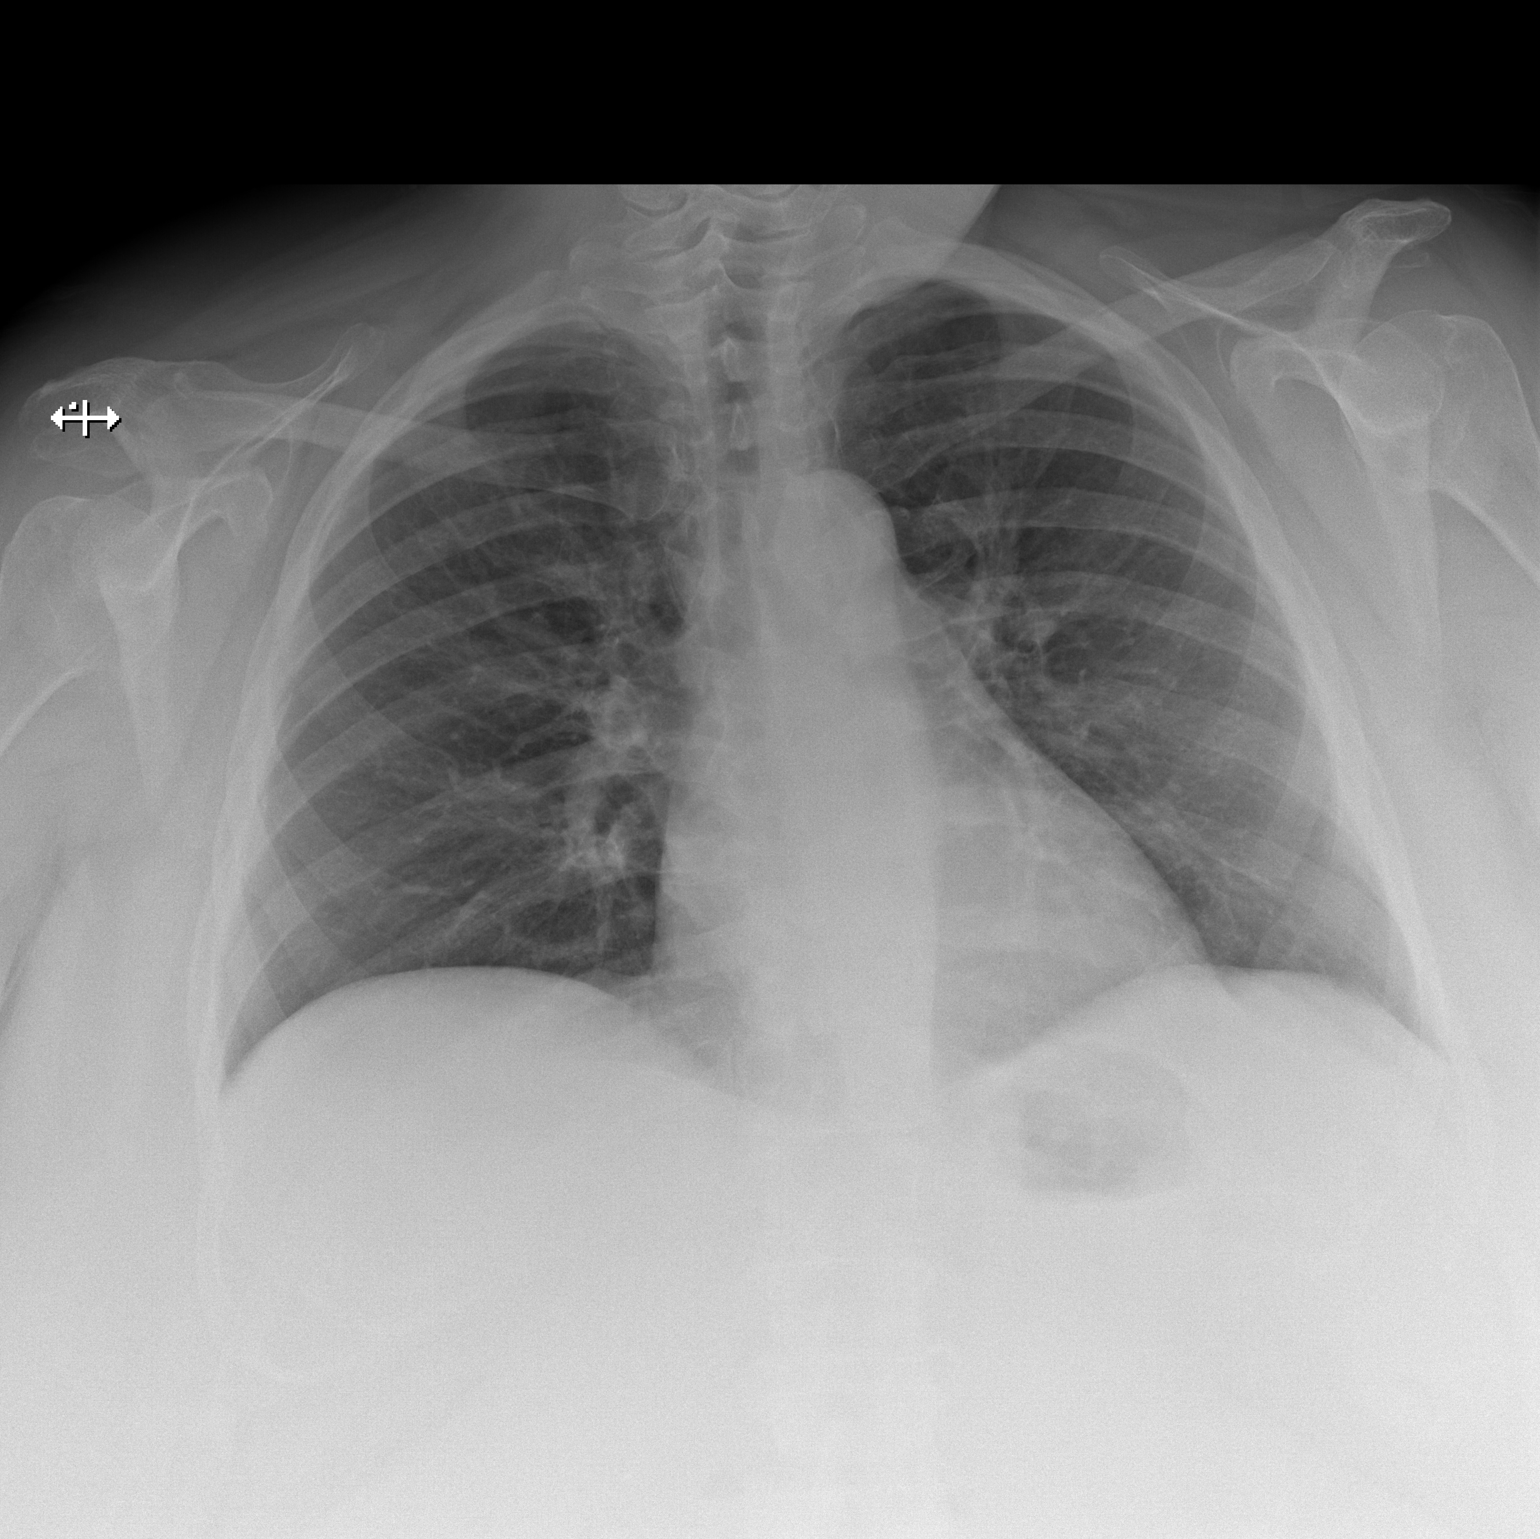
[im 2/2]
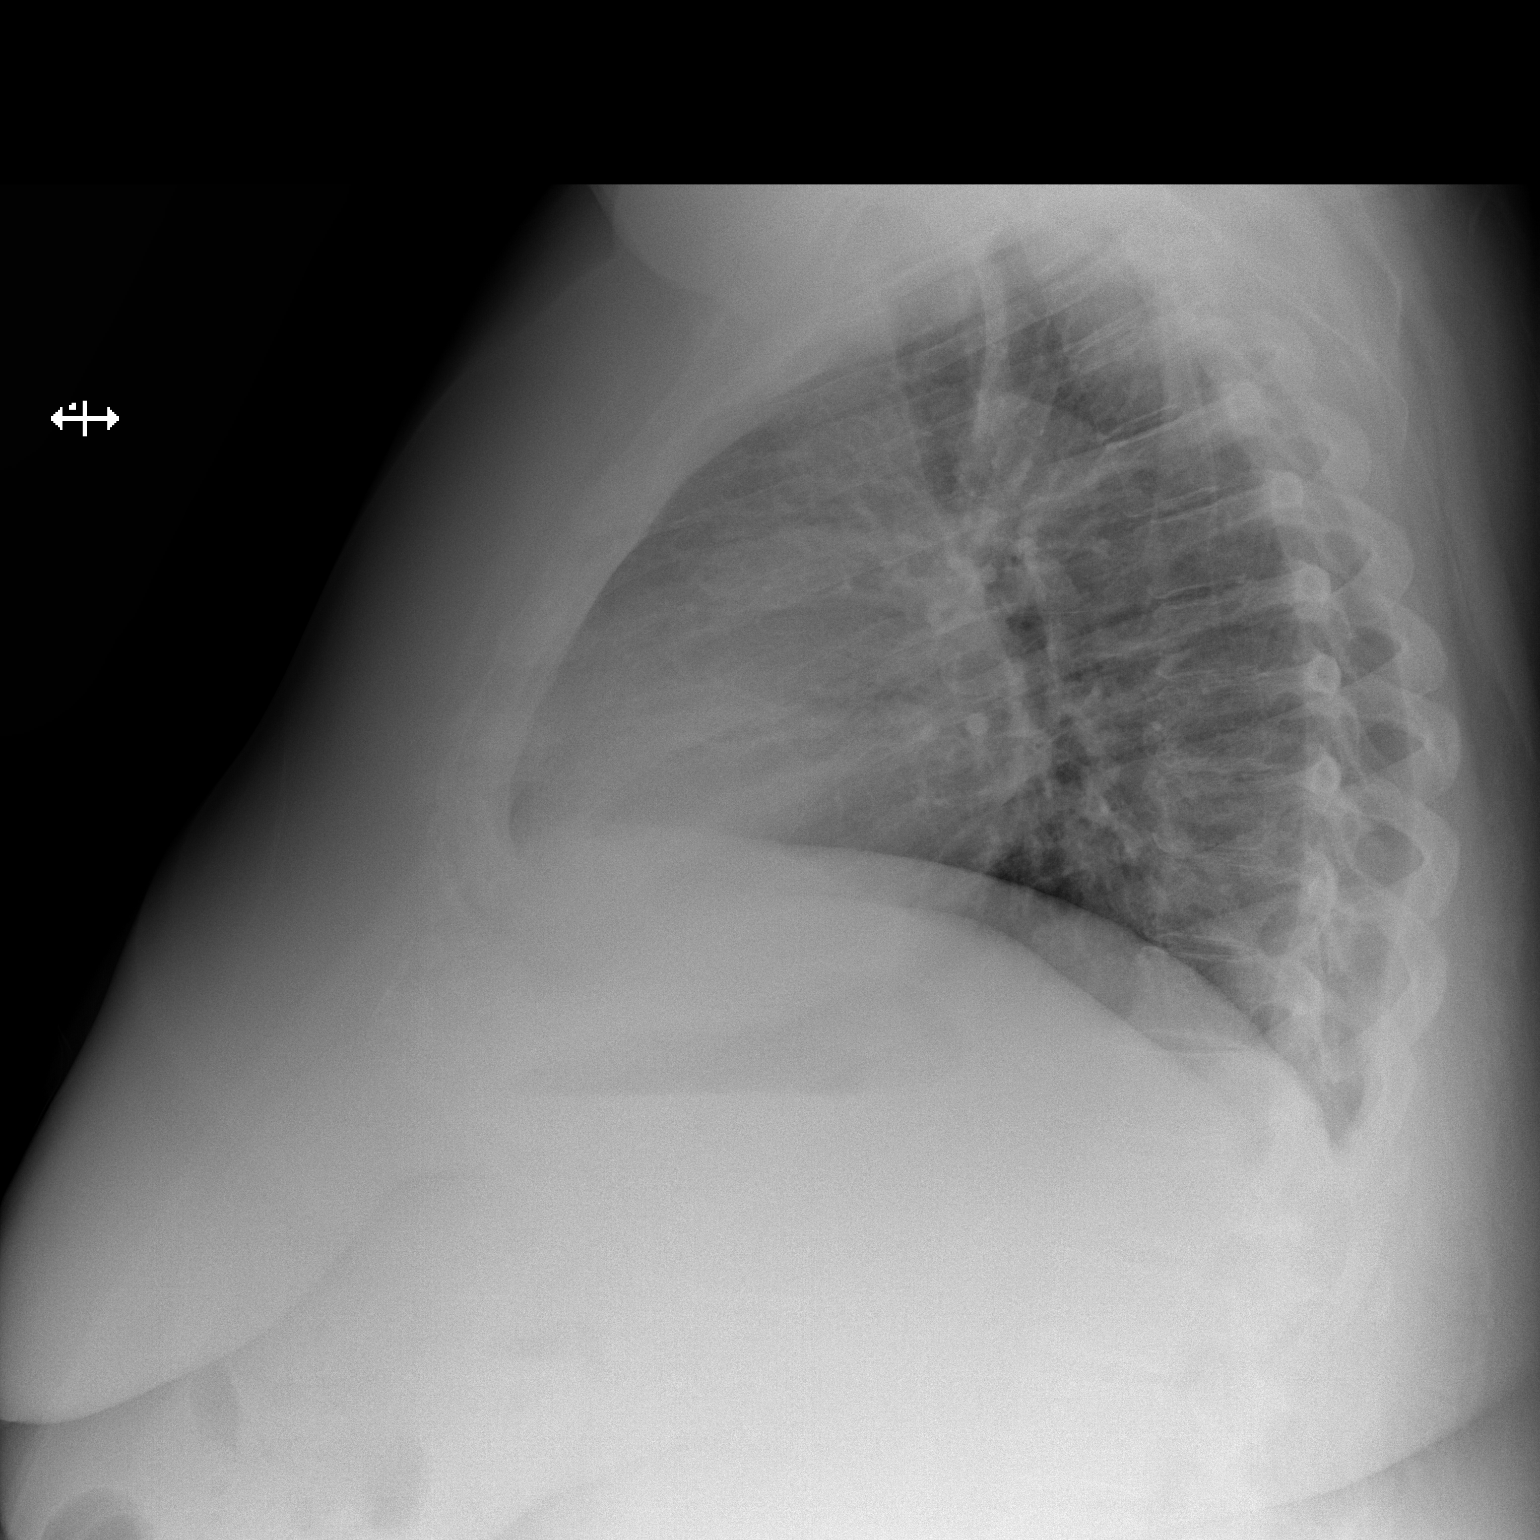

[2 of 2 positions shown; findings below may reference images not displayed]

FINDINGS: PA and lateral chest radiographs are provided.  There is no focal
parenchymal opacity, pleural effusion, or pneumothorax. The heart and
mediastinum are unremarkable.  The osseous structures are unremarkable.
IMPRESSION: No acute disease of the che[REDACTED]

## 2014-11-01 ENCOUNTER — Encounter: Payer: Self-pay | Admitting: *Deleted

## 2014-11-01 ENCOUNTER — Other Ambulatory Visit: Payer: Self-pay | Admitting: *Deleted

## 2014-11-01 ENCOUNTER — Telehealth: Payer: Self-pay

## 2014-11-01 ENCOUNTER — Ambulatory Visit (INDEPENDENT_AMBULATORY_CARE_PROVIDER_SITE_OTHER): Payer: PPO | Admitting: Family Medicine

## 2014-11-01 VITALS — BP 132/82 | HR 80 | Temp 97.8°F | Resp 18 | Wt 249.7 lb

## 2014-11-01 DIAGNOSIS — E1151 Type 2 diabetes mellitus with diabetic peripheral angiopathy without gangrene: Secondary | ICD-10-CM

## 2014-11-01 DIAGNOSIS — E1159 Type 2 diabetes mellitus with other circulatory complications: Secondary | ICD-10-CM | POA: Diagnosis not present

## 2014-11-01 DIAGNOSIS — E119 Type 2 diabetes mellitus without complications: Secondary | ICD-10-CM | POA: Diagnosis not present

## 2014-11-01 NOTE — Telephone Encounter (Signed)
Please speak with [name] before her appointment today. I see that she has made an appointment to discuss diabetic shoes however back in NOv 2015 (see [employer] messages) we decided that any diabetic shoes need to be discussed with [name] as I have no means of order custom diabetic shoes. Please clarify this with her prior to her appointment.   ALSO I messaged her through mychart about her normal DEXA bone density scan but looks like she never read it, please let her know.  Dr. Nadine Counts

## 2014-11-01 NOTE — Progress Notes (Signed)
Name: [name]   MRN: [id number]    DOB: Apr 24, 1950   Date:11/01/2014       Progress Note  Subjective  Chief Complaint  Chief Complaint  Patient presents with  . Diabetes    patient is here to complete paperwork for diabetic shoes and inserts     HPI  [name] is a pleasant 64 year old female who is here today with a form to be completed with exam of her feet for diabetic shoes. She has a current relevant medical diagnosis of DM II Controlled with peripheral neuropathy and circulatory dysfunction, CKD stage II, Hypertension, venous statis dermatitis, history of LE ulcers with reoccurance, Hypothyroidism.  Her most recent Hba1c on 10/10/14 was 6.6% meeting goal of Hba1c <8% age adjusted. Current diabetes medication regimen includes Metformin 1000 mg po bid along with diet and lifestyle changes. Patient is taking medications as instructed. Overall the patient feels that their blood glucose is well controlled. Not checking home blood glucose regularly. She is checking her feet daily and sees a podiatry specialist as well. She notes dryness to her feet, abrasions, early stages on ulcers, crooked toes and flat feet and reduced sensation in her feet.  Patient Active Problem List   Diagnosis Date Noted  . Comprehensive diabetic foot examination, type 2 DM, encounter for 11/01/2014  . Marital problems 10/25/2014  . Depression 10/24/2014  . Anxiety and depression 09/21/2014  . Atrial fibrillation, controlled 09/21/2014  . Chronic kidney disease 09/21/2014  . Allergic contact dermatitis 09/21/2014  . Bladder cystocele 09/21/2014  . Gastro-esophageal reflux disease without esophagitis 09/21/2014  . History of cellulitis 09/21/2014  . H/O deep venous thrombosis 09/21/2014  . Personal history of surgery to heart and great vessels, presenting hazards to health 09/21/2014  . H/O fall 09/21/2014  . Cannot sleep 09/21/2014  . Neuropathy involving both lower extremities 09/21/2014  .  Extreme obesity 09/21/2014  . Hernia, rectovaginal 09/21/2014  . Vascular disorder of lower extremity 09/21/2014  . Candidal dermatitis 09/21/2014  . Dryness of vagina 09/21/2014  . Ulcer of lower extremity 09/21/2014  . DM (diabetes mellitus) type II controlled peripheral vascular disorder 08/31/2014  . Diabetic peripheral neuropathy associated with type 2 diabetes mellitus 08/31/2014  . Asthma, moderate persistent, well-controlled 08/31/2014  . Hypothyroidism, adult 08/31/2014  . Peripheral vascular disease due to secondary diabetes mellitus 12/11/2013  . Apnea, sleep 10/19/2013  . Venous stasis dermatitis of both lower extremities 10/06/2013  . Paroxysmal atrial fibrillation 07/27/2013  . Hypertension goal BP (blood pressure) < 150/90 07/27/2013  . Hyperhidrosis - unclear etiology 07/27/2013    Social History  Substance Use Topics  . Smoking status: Never Smoker   . Smokeless tobacco: Never Used  . Alcohol Use: No     Current outpatient prescriptions:  .  ALPRAZolam (XANAX) 0.5 MG tablet, Take 1 tablet by mouth 2 (two) times daily as needed., Disp: , Rfl:  .  buPROPion (WELLBUTRIN XL) 300 MG 24 hr tablet, TAKE ONE TABLET BY MOUTH EVERY DAY, Disp: 30 tablet, Rfl: 5 .  dabigatran (PRADAXA) 150 MG CAPS capsule, Take 150 mg by mouth 2 (two) times daily., Disp: , Rfl:  .  DULoxetine (CYMBALTA) 60 MG capsule, TAKE 1 CAPSULE EVERY DAY, Disp: 30 capsule, Rfl: 5 .  estradiol (ESTRACE VAGINAL) 0.1 MG/GM vaginal cream, Place 1 application vaginally 2 (two) times a week., Disp: , Rfl:  .  Fluticasone-Salmeterol (ADVAIR) 250-50 MCG/DOSE AEPB, Inhale 2 puffs into the lungs 2 (two) times  daily., Disp: , Rfl:  .  gabapentin (NEURONTIN) 600 MG tablet, , Disp: , Rfl:  .  HYDROcodone-acetaminophen (NORCO/VICODIN) 5-325 MG per tablet, , Disp: , Rfl:  .  levothyroxine (SYNTHROID, LEVOTHROID) 150 MCG tablet, Take 150 mcg by mouth daily before breakfast., Disp: , Rfl:  .  Lorcaserin HCl (BELVIQ) 10  MG TABS, Take 1 tablet by mouth 2 (two) times daily., Disp: , Rfl:  .  meloxicam (MOBIC) 7.5 MG tablet, Take 7.5 mg by mouth daily., Disp: , Rfl:  .  metFORMIN (GLUCOPHAGE) 1000 MG tablet, Take 1 tablet by mouth 2 (two) times daily., Disp: , Rfl:  .  montelukast (SINGULAIR) 10 MG tablet, TAKE ONE TABLET EVERY DAY, Disp: 90 tablet, Rfl: 2 .  MYRBETRIQ 25 MG TB24 tablet, TAKE ONE TABLET EVERY DAY, Disp: 30 tablet, Rfl: 3 .  pantoprazole (PROTONIX) 40 MG tablet, Take 1 tablet (40 mg total) by mouth daily., Disp: 90 tablet, Rfl: 2 .  quinapril (ACCUPRIL) 20 MG tablet, Take 20 mg by mouth at bedtime., Disp: , Rfl:  .  warfarin (COUMADIN) 5 MG tablet, , Disp: , Rfl:  .  zolpidem (AMBIEN CR) 12.5 MG CR tablet, Take 12.5 mg by mouth at bedtime as needed for sleep., Disp: , Rfl:   Past Surgical History  Procedure Laterality Date  . Hernia repair    . Ankle surgery Right   . Vaginal hysterectomy    . Sleeve gastroplasty    . Lithotripsy    . Kidney surgery    . Transthoracic echocardiogram  08/03/2013    Mild-Moderate concentric LVH, EF 60-65%. Normal diastolic function. Mild LA dilation. Mild MR with possible vegetation  - not confirmed on TEE   . Transesophageal echocardiogram  08/08/2013    Mild LVH, EF 60-65%. Moderate LA dilation and mild RA dilation. Mild MR with no evidence of stenosis and no evidence of endocarditis. A false chordae is noted.  . Tonsillectomy    . Total knee arthroplasty      Family History  Problem Relation Age of Onset  . Skin cancer Father   . Diabetes Father   . Hypertension Father   . Peripheral vascular disease Father   . Cancer Father   . Cerebral aneurysm Father   . Varicose Veins Mother   . Kidney disease Mother   . Mental illness Sister   . Cancer Maternal Aunt     breast  . Breast cancer Maternal Aunt     Allergies  Allergen Reactions  . Aspirin Hives  . Penicillins Hives  . Terramycin [Oxytetracycline] Hives  . Other Rash    pineapple      Review of Systems  CONSTITUTIONAL: No significant weight changes, fever, chills, weakness or fatigue.  HEENT:  - Eyes: No visual changes.  - Ears: No auditory changes. No pain.  - Nose: No sneezing, congestion, runny nose. - Throat: No sore throat. No changes in swallowing. SKIN: No rash or itching.  CARDIOVASCULAR: No chest pain, chest pressure or chest discomfort. No palpitations or edema.  RESPIRATORY: No shortness of breath, cough or sputum.  GASTROINTESTINAL: No anorexia, nausea, vomiting. No changes in bowel habits. No abdominal pain or blood.  GENITOURINARY: No dysuria. No frequency. No discharge. NEUROLOGICAL: No headache, dizziness, syncope, paralysis, ataxia. Yes numbness or tingling in the lower extremities. No memory changes. No change in bowel or bladder control.  MUSCULOSKELETAL: No joint pain. No muscle pain. HEMATOLOGIC: No anemia, bleeding or bruising.  LYMPHATICS: No enlarged lymph nodes.  PSYCHIATRIC: No change in mood. No change in sleep pattern.  ENDOCRINOLOGIC: No reports of sweating, cold or heat intolerance. No polyuria or polydipsia.     Objective  BP 132/82 mmHg  Pulse 80  Temp(Src) 97.8 F (36.6 C) (Oral)  Resp 18  Wt 249 lb 11.2 oz (113.263 kg)  SpO2 97% Body mass index is 40.61 kg/(m^2).  Physical Exam  Constitutional: Patient is obese and well-nourished. In no distress.  HEENT:  - Head: Normocephalic and atraumatic.  - Ears: Bilateral TMs gray, no erythema or effusion - Nose: Nasal mucosa moist - Mouth/Throat: Oropharynx is clear and moist. No tonsillar hypertrophy or erythema. No post nasal drainage.  - Eyes: Conjunctivae clear, EOM movements normal. PERRLA. No scleral icterus.  Neck: Normal range of motion. Neck supple. No JVD present. No thyromegaly present.  Cardiovascular: Normal rate, regular rhythm and normal heart sounds.  No murmur heard.  Pulmonary/Chest: Effort normal and breath sounds normal. No respiratory  distress. Musculoskeletal: Normal range of motion bilateral UE and LE, no joint effusions. Peripheral vascular: Bilateral LE no edema.  Atrophic shiny hairless skin on bother lower extremities with early stages of ulcers over tibial region measuring about 0.5cm with no surrounding skin erythema.  Neurological: CN II-XII grossly intact with no focal deficits. Alert and oriented to person, place, and time. Coordination and strength are normal. Poor balance due to diminished sensation in feet, waddling gait.  Skin: Skin is warm and dry.  Psychiatric: Patient has a happy mood and affect today. Behavior is normal in office today. Judgment and thought content normal in office today.  Diabetic Foot Exam - Simple   Simple Foot Form  Diabetic Foot exam was performed with the following findings:  Yes 11/01/2014  3:23 PM  Visual Inspection  See comments:  Yes  Sensation Testing  See comments:  Yes  Pulse Check  See comments:  Yes  Comments  Early stages of flat feet associated with Charcot foot deformity in both feet with RIGHT foot 5th digit folded under, LEFT foot 1st digit overlapping 2nd digit with crooked 2nd-4th digits. Callus formation over right foot distal aspect of toes and plantar surface of left foot 1st metatarsal pad area. Diminished monofilament testing bilaterally with absent posterior tibialis and dorsalis pedis pulses bilaterally. No current open wounds or ulcers but has pre-ulcerative lesion over right foot 5th digit and distal ends of digits 1-3.  Continues to have abrasion to ventral surface of toes due to abrasion against regular shoes as well as continued propensity for lower extremity ulcers and foot ulcers.       Recent Results (from the past 2160 hour(s))  POCT Urinalysis Dipstick     Status: Normal   Collection Time: 09/21/14  4:13 PM  Result Value Ref Range   Color, UA YELLOW    Clarity, UA CLEAR    Glucose, UA NEGATIVE    Bilirubin, UA NEGATIVE    Ketones, UA NEGATIVE     Spec Grav, UA 1.020    Blood, UA NEGATIVE    pH, UA 6.0    Protein, UA NEGATIVE    Urobilinogen, UA 0.2    Nitrite, UA NEGATIVE    Leukocytes, UA Negative Negative  CBC with Differential/Platelet     Status: None   Collection Time: 10/10/14  8:22 AM  Result Value Ref Range   WBC 5.9 3.4 - 10.8 x10E3/uL   RBC 4.36 3.77 - 5.28 x10E6/uL   Hemoglobin 12.5 11.1 - 15.9 g/dL   Hematocrit  36.3 34.0 - 46.6 %   MCV 83 79 - 97 fL   MCH 28.7 26.6 - 33.0 pg   MCHC 34.4 31.5 - 35.7 g/dL   RDW 14.8 12.3 - 15.4 %   Platelets 263 150 - 379 x10E3/uL   Neutrophils 64 %   Lymphs 20 %   Monocytes 9 %   Eos 6 %   Basos 1 %   Neutrophils Absolute 3.8 1.4 - 7.0 x10E3/uL   Lymphocytes Absolute 1.2 0.7 - 3.1 x10E3/uL   Monocytes Absolute 0.5 0.1 - 0.9 x10E3/uL   EOS (ABSOLUTE) 0.4 0.0 - 0.4 x10E3/uL   Basophils Absolute 0.1 0.0 - 0.2 x10E3/uL   Immature Granulocytes 0 %   Immature Grans (Abs) 0.0 0.0 - 0.1 x10E3/uL  Comprehensive metabolic panel     Status: Abnormal   Collection Time: 10/10/14  8:22 AM  Result Value Ref Range   Glucose 122 (H) 65 - 99 mg/dL   BUN 16 8 - 27 mg/dL   Creatinine, Ser 0.79 0.57 - 1.00 mg/dL   GFR calc non Af Amer 79 >59 mL/min/1.73   GFR calc Af Amer 91 >59 mL/min/1.73   BUN/Creatinine Ratio 20 11 - 26   Sodium 143 134 - 144 mmol/L   Potassium 4.9 3.5 - 5.2 mmol/L   Chloride 102 97 - 108 mmol/L   CO2 23 18 - 29 mmol/L   Calcium 9.3 8.7 - 10.3 mg/dL   Total Protein 6.5 6.0 - 8.5 g/dL   Albumin 4.2 3.6 - 4.8 g/dL   Globulin, Total 2.3 1.5 - 4.5 g/dL   Albumin/Globulin Ratio 1.8 1.1 - 2.5   Bilirubin Total 0.3 0.0 - 1.2 mg/dL   Alkaline Phosphatase 88 39 - 117 IU/L   AST 22 0 - 40 IU/L   ALT 17 0 - 32 IU/L  TSH     Status: None   Collection Time: 10/10/14  8:22 AM  Result Value Ref Range   TSH 1.750 0.450 - 4.500 uIU/mL  Lipid panel     Status: None   Collection Time: 10/10/14  8:22 AM  Result Value Ref Range   Cholesterol, Total 185 100 - 199 mg/dL    Triglycerides 89 0 - 149 mg/dL   HDL 94 >39 mg/dL    Comment: According to ATP-III Guidelines, HDL-C >59 mg/dL is considered a negative risk factor for CHD.    VLDL Cholesterol Cal 18 5 - 40 mg/dL   LDL Calculated 73 0 - 99 mg/dL   Chol/HDL Ratio 2.0 0.0 - 4.4 ratio units    Comment:                                   T. Chol/HDL Ratio                                             Men  Women                               1/2 Avg.Risk  3.4    3.3                                   Avg.Risk  5.0    4.4                                2X Avg.Risk  9.6    7.1                                3X Avg.Risk 23.4   11.0   T3, free     Status: None   Collection Time: 10/10/14  8:22 AM  Result Value Ref Range   T3, Free 2.1 2.0 - 4.4 pg/mL  T4, free     Status: None   Collection Time: 10/10/14  8:22 AM  Result Value Ref Range   Free T4 1.33 0.82 - 1.77 ng/dL  HgB A1c     Status: Abnormal   Collection Time: 10/10/14  8:22 AM  Result Value Ref Range   Hgb A1c MFr Bld 6.6 (H) 4.8 - 5.6 %    Comment:          Pre-diabetes: 5.7 - 6.4          Diabetes: >6.4          Glycemic control for adults with diabetes: <7.0    Est. average glucose Bld gHb Est-mCnc 143 mg/dL     Assessment & Plan  1. DM (diabetes mellitus) type II controlled peripheral vascular disorder Patient's Hba1c goal is  <8%, reasonable due to being elderly and is at risk for hypoglycemic events.   Encouraged patient to continue efforts on checking blood glucose on a daily basis. Fasting blood glucose control goal is 100-140mg /dL and post prandial blood glucose control is 180mg /dL.  Reviewed diet, exercise, lifestyle changes and current medication regimen pertaining to diabetes with the patient.    2. Comprehensive diabetic foot examination, type 2 DM, encounter for [name] would benefit from diabetic shoes to prevent further complications from her diabetes type two progressive disorder. She has evidence of poor  circulation, peripheral neuropathy with evidence of callus formation, dryness, pre-ulceration lesions, redness and history of previous foot ulceration.  Custom molded shows with custom fabricated inserts ordered.

## 2014-11-01 NOTE — Telephone Encounter (Signed)
Tried to contact this patient regarding her appt for diabetic shoes but there was no answer. A message was left asking her to give [place] a call prior to her appt.

## 2014-11-08 ENCOUNTER — Other Ambulatory Visit: Payer: Self-pay | Admitting: Family Medicine

## 2014-11-08 DIAGNOSIS — F419 Anxiety disorder, unspecified: Secondary | ICD-10-CM

## 2014-11-08 DIAGNOSIS — F329 Major depressive disorder, single episode, unspecified: Secondary | ICD-10-CM

## 2014-11-09 ENCOUNTER — Encounter: Payer: Self-pay | Admitting: Urology

## 2014-11-09 ENCOUNTER — Ambulatory Visit (INDEPENDENT_AMBULATORY_CARE_PROVIDER_SITE_OTHER): Payer: PPO | Admitting: Urology

## 2014-11-09 VITALS — BP 177/85 | HR 71 | Resp 18 | Ht 64.0 in | Wt 253.1 lb

## 2014-11-09 DIAGNOSIS — D3501 Benign neoplasm of right adrenal gland: Secondary | ICD-10-CM | POA: Diagnosis not present

## 2014-11-09 DIAGNOSIS — N3946 Mixed incontinence: Secondary | ICD-10-CM | POA: Diagnosis not present

## 2014-11-09 DIAGNOSIS — D3502 Benign neoplasm of left adrenal gland: Secondary | ICD-10-CM

## 2014-11-09 DIAGNOSIS — D1779 Benign lipomatous neoplasm of other sites: Secondary | ICD-10-CM

## 2014-11-09 DIAGNOSIS — N952 Postmenopausal atrophic vaginitis: Secondary | ICD-10-CM | POA: Diagnosis not present

## 2014-11-09 LAB — URINALYSIS, COMPLETE
Bilirubin, UA: NEGATIVE
Glucose, UA: NEGATIVE
Ketones, UA: NEGATIVE
LEUKOCYTES UA: NEGATIVE
Nitrite, UA: NEGATIVE
PH UA: 6 (ref 5.0–7.5)
PROTEIN UA: NEGATIVE
RBC, UA: NEGATIVE
Specific Gravity, UA: 1.025 (ref 1.005–1.030)
Urobilinogen, Ur: 0.2 mg/dL (ref 0.2–1.0)

## 2014-11-09 LAB — BLADDER SCAN AMB NON-IMAGING

## 2014-11-09 LAB — MICROSCOPIC EXAMINATION
BACTERIA UA: NONE SEEN
RBC MICROSCOPIC, UA: NONE SEEN /HPF (ref 0–?)
WBC, UA: NONE SEEN /hpf (ref 0–?)

## 2014-11-09 MED ORDER — MIRABEGRON ER 25 MG PO TB24: 25.0000 mg | ORAL_TABLET | Freq: Every day | ORAL | Status: DC

## 2014-11-09 NOTE — Progress Notes (Addendum)
11/09/2014 11:29 AM   [name] [birth year] [id number]  Referring provider: Bobetta Lime, MD [address] [place], [place] [address]  Chief Complaint  Patient presents with  . Urinary Incontinence    HPI: Patient is a 64 year old white female with a history of urinary incontinence, renal stones and bilateral adrenal myolipomas who presents today for 1 year follow-up.  Patient has symptoms of stress and urge incontinence. She loses urine if she laughs, coughs or sneezes. She also loses urine if she cannot make it to the bathroom on time when she feels the urge to urinate.  She had been tried on several anticholinergics in the past, such as: Oxybutynin, Gelnique and Enablex.  She is currently on Myrbetriq and findings this medication the most effective in controlling her urinary incontinence.  She would like to continue this medication.  Her PVR on today's exam is 136 mL.  She is not experiencing suprapubic pain, gross hematuria or dysuria.  She has not had any recent flank pain or passage of stones.  A CT scan performed in 2014 did not note any nephrolithiasis.  UA today is unremarkable.     PMH: Past Medical History  Diagnosis Date  . Sinus problem   . Swelling   . Thyroid disease   . Diabetes   . Venous stasis dermatitis of both lower extremities   . Hx of blood clots   . HBP (high blood pressure)   . Kidney disease   . Depression   . Dysthymic disorder   . Paroxysmal atrial fibrillation 2007    In June 2015, Cardiac Event Monitor: Mostly SR/sinus arrhythmia with PVCs that are frequent. Short bursts of A. fib lasting several minutes  . Asthma   . GERD (gastroesophageal reflux disease)   . Heart murmur     Echocardiogram June 2015: Mild MR (possible vegetation seen on TTE, not seen on TEE), normal LV size with moderate concentric LVH. Normal function EF 60-65%. Normal diastolic function. Mild LA dilation.  . DVT (deep venous thrombosis)   . Insomnia    . Atrial fibrillation, controlled   . Cystocele   . Neuropathy involving both lower extremities   . Anxiety and depression   . Obesity, Class II, BMI 35-39.9, with comorbidity   . Cholecystolithiasis   . Vaginitis   . Kidney stone   . Adrenal mass     Surgical History: Past Surgical History  Procedure Laterality Date  . Hernia repair    . Ankle surgery Right   . Vaginal hysterectomy    . Sleeve gastroplasty    . Lithotripsy    . Kidney surgery    . Transthoracic echocardiogram  08/03/2013    Mild-Moderate concentric LVH, EF 60-65%. Normal diastolic function. Mild LA dilation. Mild MR with possible vegetation  - not confirmed on TEE   . Transesophageal echocardiogram  08/08/2013    Mild LVH, EF 60-65%. Moderate LA dilation and mild RA dilation. Mild MR with no evidence of stenosis and no evidence of endocarditis. A false chordae is noted.  . Tonsillectomy    . Total knee arthroplasty      Home Medications:    Medication List       This list is accurate as of: 11/09/14 11:29 AM.  Always use your most recent med list.               ALPRAZolam 0.5 MG tablet  Commonly known as:  XANAX  TAKE ONE TABLET TWICE DAILY  AS NEEDED     BELVIQ 10 MG Tabs  Generic drug:  Lorcaserin HCl  Take 1 tablet by mouth 2 (two) times daily.     buPROPion 300 MG 24 hr tablet  Commonly known as:  WELLBUTRIN XL  TAKE ONE TABLET BY MOUTH EVERY DAY     dabigatran 150 MG Caps capsule  Commonly known as:  PRADAXA  Take 150 mg by mouth 2 (two) times daily.     DULoxetine 60 MG capsule  Commonly known as:  CYMBALTA  TAKE 1 CAPSULE EVERY DAY     ESTRACE VAGINAL 0.1 MG/GM vaginal cream  Generic drug:  estradiol  Place 1 application vaginally 2 (two) times a week.     gabapentin 600 MG tablet  Commonly known as:  NEURONTIN     HYDROcodone-acetaminophen 5-325 MG per tablet  Commonly known as:  NORCO/VICODIN     levothyroxine 150 MCG tablet  Commonly known as:  SYNTHROID, LEVOTHROID    Take 150 mcg by mouth daily before breakfast.     meloxicam 7.5 MG tablet  Commonly known as:  MOBIC  Take 7.5 mg by mouth daily.     metFORMIN 1000 MG tablet  Commonly known as:  GLUCOPHAGE  Take 1 tablet by mouth 2 (two) times daily.     montelukast 10 MG tablet  Commonly known as:  SINGULAIR  TAKE ONE TABLET EVERY DAY     MYRBETRIQ 25 MG Tb24 tablet  Generic drug:  mirabegron ER  TAKE ONE TABLET EVERY DAY     pantoprazole 40 MG tablet  Commonly known as:  PROTONIX  Take 1 tablet (40 mg total) by mouth daily.     quinapril 20 MG tablet  Commonly known as:  ACCUPRIL  Take 20 mg by mouth at bedtime.     warfarin 5 MG tablet  Commonly known as:  COUMADIN     zolpidem 12.5 MG CR tablet  Commonly known as:  AMBIEN CR  Take 12.5 mg by mouth at bedtime as needed for sleep.        Allergies:  Allergies  Allergen Reactions  . Aspirin Hives  . Penicillins Hives  . Terramycin [Oxytetracycline] Hives    Family History: Family History  Problem Relation Age of Onset  . Skin cancer Father   . Diabetes Father   . Hypertension Father   . Peripheral vascular disease Father   . Cancer Father   . Cerebral aneurysm Father   . Varicose Veins Mother   . Kidney disease Mother   . Mental illness Sister   . Cancer Maternal Aunt     breast  . Breast cancer Maternal Aunt     Social History:  reports that she has never smoked. She has never used smokeless tobacco. She reports that she does not drink alcohol or use illicit drugs.  ROS: UROLOGY Frequent Urination?: No Hard to postpone urination?: Yes Burning/pain with urination?: No Get up at night to urinate?: No Leakage of urine?: Yes Urine stream starts and stops?: No Trouble starting stream?: No Do you have to strain to urinate?: No Blood in urine?: No Urinary tract infection?: No Sexually transmitted disease?: No Injury to kidneys or bladder?: No Painful intercourse?: No Weak stream?: No Currently pregnant?:  No Vaginal bleeding?: No Last menstrual period?: n  Gastrointestinal Nausea?: No Vomiting?: No Indigestion/heartburn?: Yes Diarrhea?: Yes Constipation?: No  Constitutional Fever: No Night sweats?: No Weight loss?: No Fatigue?: No  Skin Skin rash/lesions?: No Itching?: No  Eyes  Blurred vision?: No Double vision?: No  Ears/Nose/Throat Sore throat?: No Sinus problems?: Yes  Hematologic/Lymphatic Swollen glands?: No Easy bruising?: Yes  Cardiovascular Leg swelling?: No Chest pain?: No  Respiratory Cough?: No Shortness of breath?: No  Endocrine Excessive thirst?: No  Musculoskeletal Back pain?: No Joint pain?: No  Neurological Headaches?: No Dizziness?: No  Psychologic Depression?: No Anxiety?: Yes  Physical Exam: BP 177/85 mmHg  Pulse 71  Resp 18  Ht 5\' 4"  (1.626 m)  Wt 253 lb 1.6 oz (114.805 kg)  BMI 43.42 kg/m2  GU:  Atrophic external genitalia.  Normal urethral meatus. No urethral masses and/or tenderness. No bladder fullness or masses. Grade I cystocele noted. No vaginal lesions or discharge. Normal rectal tone, no masses. Normal anus and perineum.   Laboratory Data: Results for orders placed or performed in visit on 11/09/14  Microscopic Examination  Result Value Ref Range   WBC, UA None seen 0 -  5 /hpf   RBC, UA None seen 0 -  2 /hpf   Epithelial Cells (non renal) 0-10 0 - 10 /hpf   Bacteria, UA None seen None seen/Few  Urinalysis, Complete  Result Value Ref Range   Specific Gravity, UA 1.025 1.005 - 1.030   pH, UA 6.0 5.0 - 7.5   Color, UA Yellow Yellow   Appearance Ur Clear Clear   Leukocytes, UA Negative Negative   Protein, UA Negative Negative/Trace   Glucose, UA Negative Negative   Ketones, UA Negative Negative   RBC, UA Negative Negative   Bilirubin, UA Negative Negative   Urobilinogen, Ur 0.2 0.2 - 1.0 mg/dL   Nitrite, UA Negative Negative   Microscopic Examination See below:   BLADDER SCAN AMB NON-IMAGING  Result  Value Ref Range   Scan Result 148ml    Lab Results  Component Value Date   CREATININE 0.79 10/10/2014   Lab Results  Component Value Date   HGBA1C 6.6* 10/10/2014    Assessment & Plan:    1. Mixed urinary incontinence:   Patient is a good success with her Myrbetriq 25 mg daily. I have sent a prescription in to her pharmacy. I also given her samples of Myrbetriq 25 mg (#28) shows she would not lose continuity of therapy well we are acquiring a prior authorization for the Myrbetriq medication.  She will try return in one year for symptom recheck and PVR.  - Urinalysis, Complete - BLADDER SCAN AMB NON-IMAGING  2. Atrophic vaginitis:   Patient was given a sample of vaginal estrogen cream and instructed to apply 0.5mg  (pea-sized amount)  just inside the vaginal introitus with a finger-tip every night for two weeks and then Monday, Wednesday and Friday nights.  I explained to the patient that vaginally administered estrogen, which causes only a slight increase in the blood estrogen levels, have fewer contraindications and adverse systemic effects that oral HT.    3. Bilateral adrenal myelolipomas:   I discussed with Dr. Pilar Jarvis if further evaluation of her bilateral adrenal myelolipoma was warranted. He recommended an MR of the adrenal glands for follow-up.     Zara Council, [facility] [address], [place], [place] [address] [phone]

## 2014-11-10 IMAGING — CT CT ABD-PELV W/O CM
1 of 2 series · 15 of 32 positions shown, 19 images · non-contrast
Comparison: 11/08/2006, 12/14/2005

REASON FOR EXAM: Right flank pain Eval for stone
COMMENTS:

PROCEDURE:     KCT - KCT ABDOMEN/PELVIS WO  - August 23, 2012  [DATE]
RESULT:     Indication: Right flank pain
TECHNIQUE: Multiple axial images from the lung bases to the symphysis pubis
were obtained without oral and without intravenous contrast.

[Series 2: abdpel_xl 3.0 i40s 3 · axial · 0.98mm/px · z∈[-1168,-739]mm · 15 of 161 slices shown, 19 images]
[im 12/161  soft-tissue]
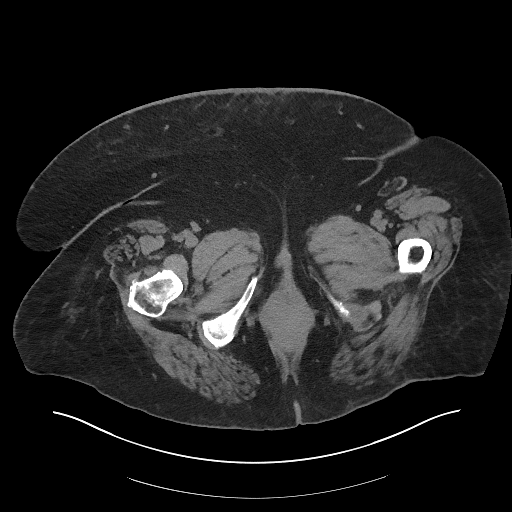
[im 12/161  bone]
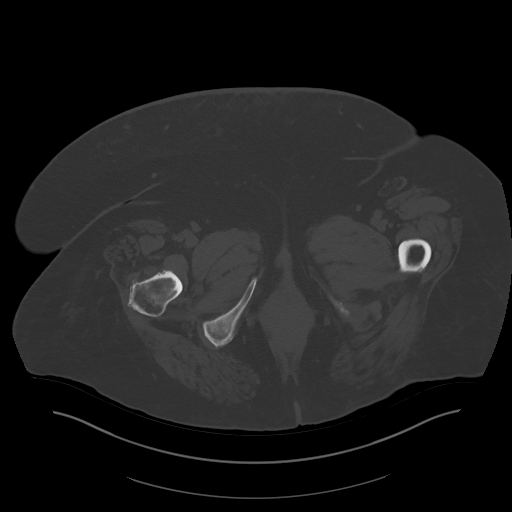
[im 24/161  soft-tissue]
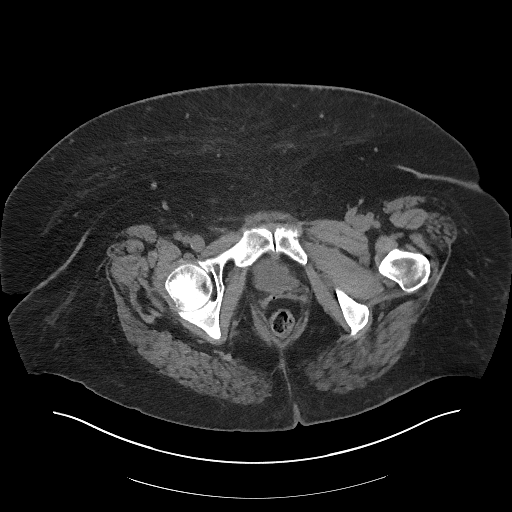
[im 36/161  soft-tissue]
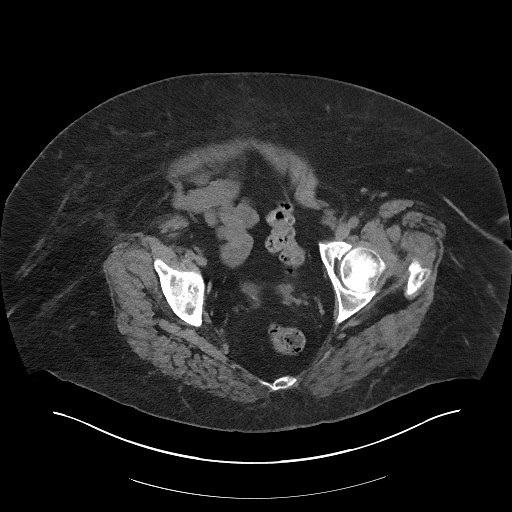
[im 48/161  soft-tissue]
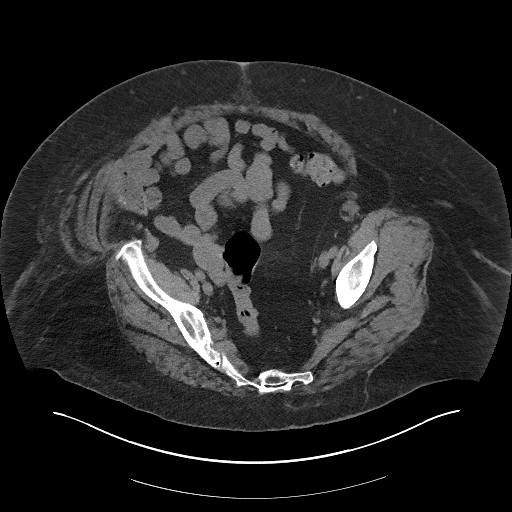
[im 60/161  soft-tissue]
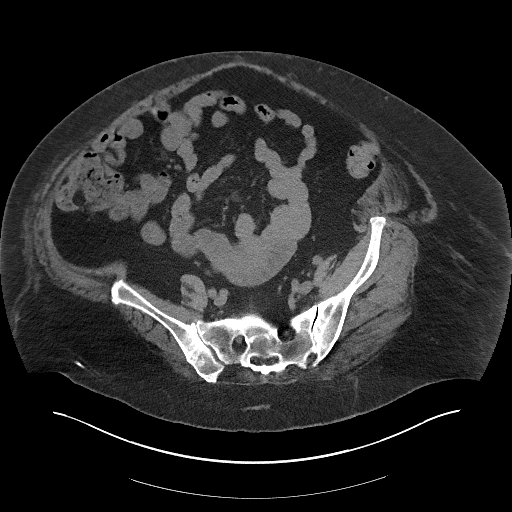
[im 72/161  soft-tissue]
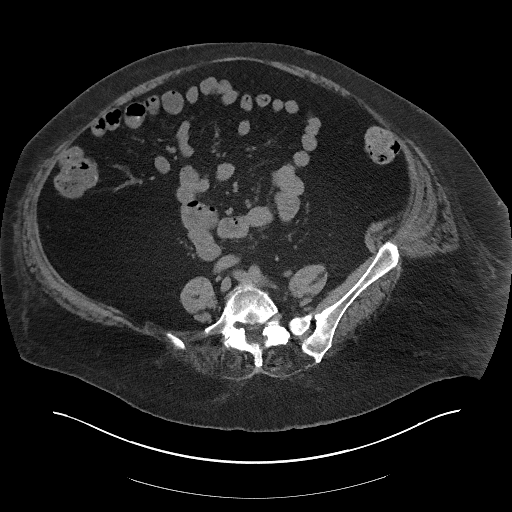
[im 83/161  soft-tissue]
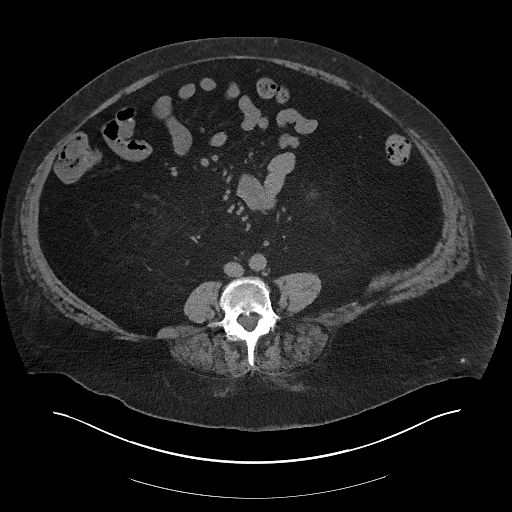
[im 95/161  soft-tissue]
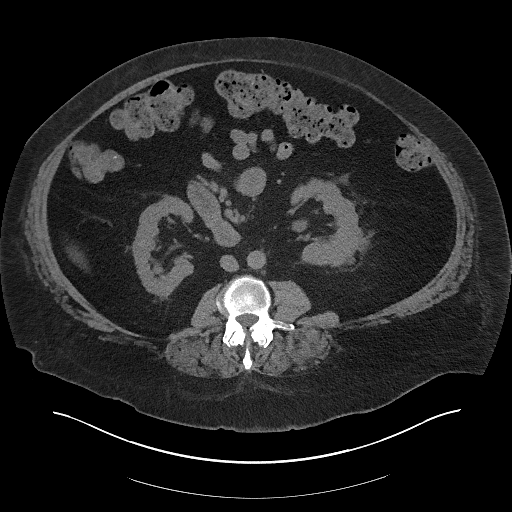
[im 107/161  soft-tissue]
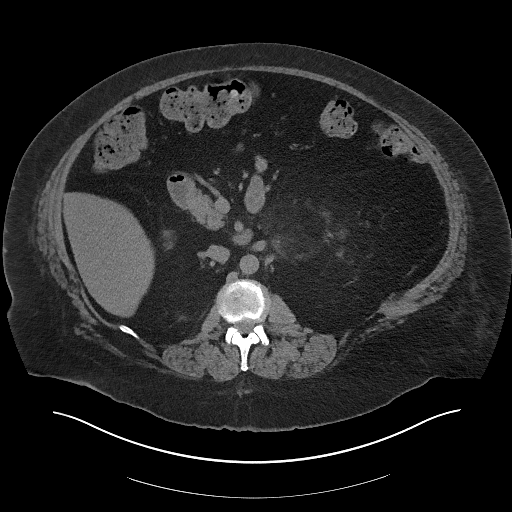
[im 107/161  bone]
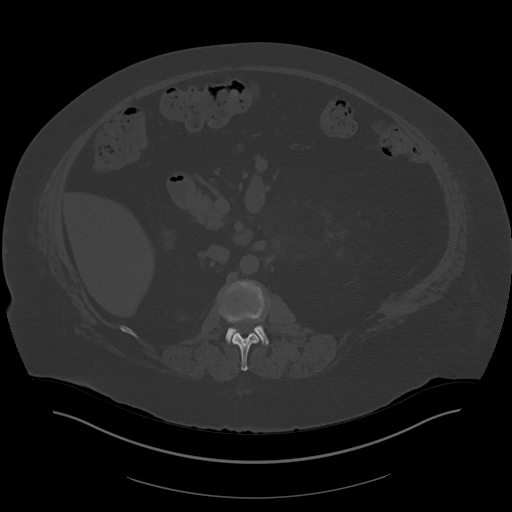
[im 119/161  soft-tissue]
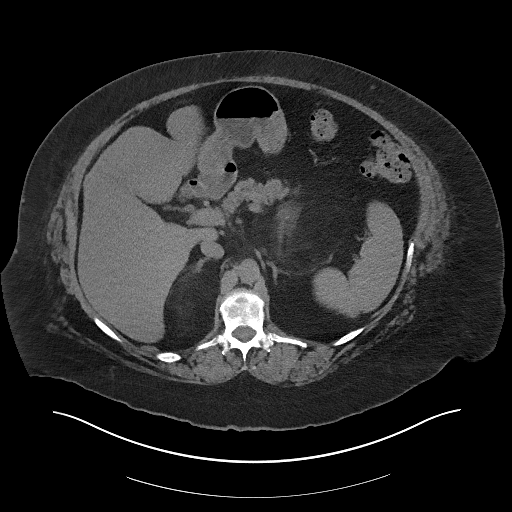
[im 131/161  soft-tissue]
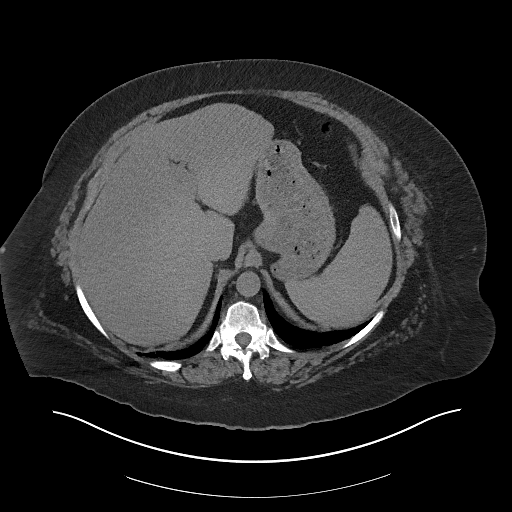
[im 137/161  lung]
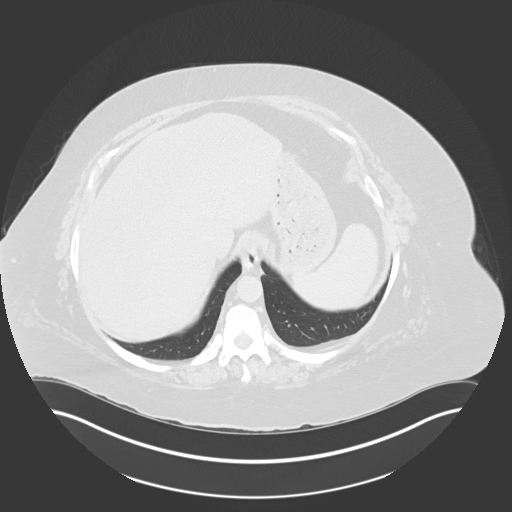
[im 143/161  soft-tissue]
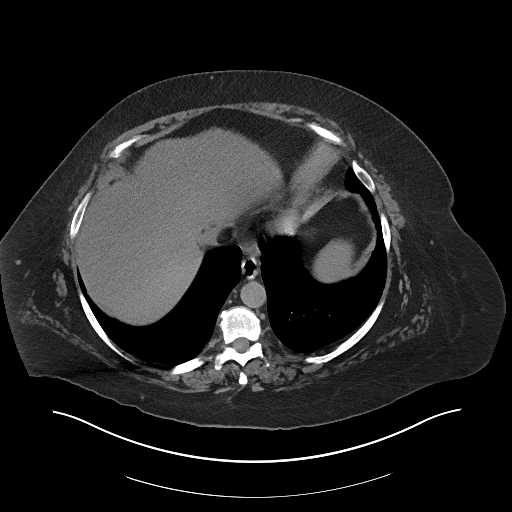
[im 143/161  lung]
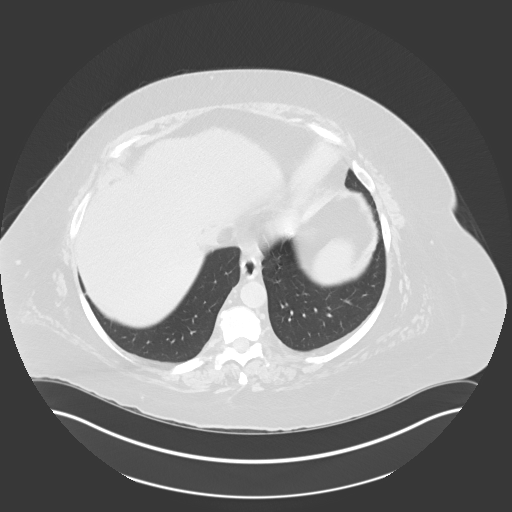
[im 149/161  lung]
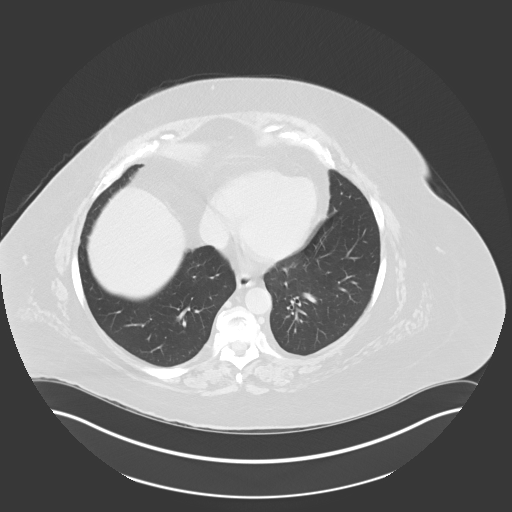
[im 155/161  soft-tissue]
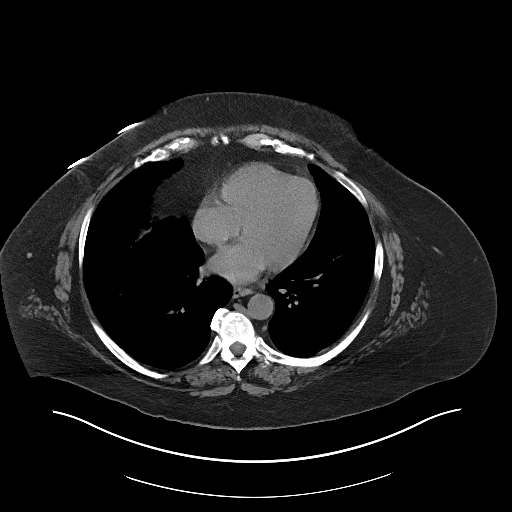
[im 155/161  lung]
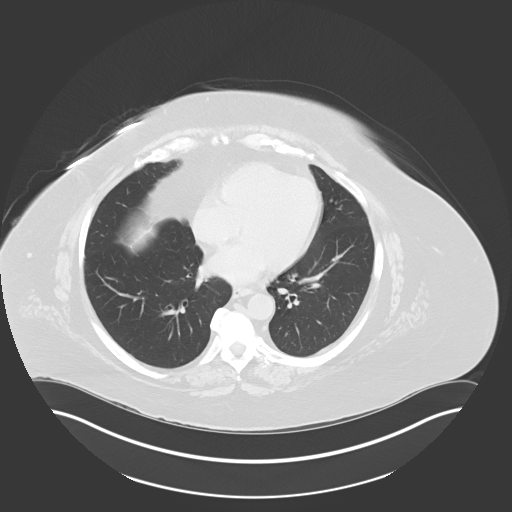

[15 of 32 positions shown; findings below may reference images not displayed]

FINDINGS: The lung bases are clear. There is no pleural or pericardial effusions.

No nephrolithiasis. No obstructive uropathy. No perinephric stranding is
seen. The kidneys are symmetric in size without evidence for exophytic mass.
The bladder is unremarkable.

The liver demonstrates no focal abnormality. The gallbladder is
unremarkable. The spleen demonstrates no focal abnormality. The pancreas is
normal.

There is a right adrenal mass measures 6.6 x 4.9 cm with macroscopic fat
most consistent with a myelolipoma. There is a 8.9 x 8.5 cm heterogeneous
left adrenal mass with macroscopic fat within it most consistent with a
myelolipoma.

The unopacified stomach, duodenum, small intestine, and large intestine are
unremarkable, but evaluation is limited by lack of oral contrast. There is
no pneumoperitoneum, pneumatosis, or portal venous gas. There is no
abdominal or pelvic free fluid. There is no lymphadenopathy.

The abdominal aorta is normal in caliber with atherosclerosis.

The osseous structures are unremarkable.
IMPRESSION: 1. No nephrolithiasis.

2. Bilateral adrenal masses with macroscopic fat within them. The masses are
most consistent with myelolipoma. The right adrenal mass has significantly
enlarged compared with the prior examination of 11/08/2006, at which time the
mass measured 4.4 cm.

## 2014-11-14 ENCOUNTER — Ambulatory Visit: Payer: Self-pay | Admitting: Licensed Clinical Social Worker

## 2014-11-21 ENCOUNTER — Ambulatory Visit: Payer: PPO | Admitting: Licensed Clinical Social Worker

## 2014-11-26 DIAGNOSIS — N952 Postmenopausal atrophic vaginitis: Secondary | ICD-10-CM | POA: Insufficient documentation

## 2014-11-26 DIAGNOSIS — N3946 Mixed incontinence: Secondary | ICD-10-CM | POA: Insufficient documentation

## 2014-11-28 ENCOUNTER — Encounter: Payer: Self-pay | Admitting: Family Medicine

## 2014-11-28 ENCOUNTER — Ambulatory Visit (INDEPENDENT_AMBULATORY_CARE_PROVIDER_SITE_OTHER): Payer: PPO | Admitting: Family Medicine

## 2014-11-28 ENCOUNTER — Ambulatory Visit: Payer: Self-pay | Admitting: Cardiology

## 2014-11-28 VITALS — BP 128/64 | HR 81 | Temp 98.5°F | Resp 16 | Ht 64.0 in | Wt 249.4 lb

## 2014-11-28 DIAGNOSIS — L03115 Cellulitis of right lower limb: Secondary | ICD-10-CM | POA: Insufficient documentation

## 2014-11-28 DIAGNOSIS — Z23 Encounter for immunization: Secondary | ICD-10-CM

## 2014-11-28 MED ORDER — SULFAMETHOXAZOLE-TRIMETHOPRIM 800-160 MG PO TABS: 1.0000 | ORAL_TABLET | Freq: Two times a day (BID) | ORAL | Status: DC

## 2014-11-28 MED ORDER — CEFTRIAXONE SODIUM 1 G IJ SOLR
1.0000 g | Freq: Once | INTRAMUSCULAR | Status: AC
  Administered 2014-11-28: 1 g via INTRAMUSCULAR

## 2014-11-28 NOTE — Patient Instructions (Signed)

## 2014-11-28 NOTE — Progress Notes (Signed)
Name: [name]   MRN: [id number]    DOB: 06/27/1950   Date:11/28/2014       Progress Note  Subjective  Chief Complaint  Chief Complaint  Patient presents with  . Cellulitis    HPI  [name] is a pleasant 64 year old female who is here today with recurrent cellulitis of her lower extremities. Right leg only this time. Not associated with fevers, chills, nausea, vomiting. Does feel fatigued. Onset 3 days ago but rapidly worsened overnight last night into this morning. She has a current relevant medical diagnosis of DM II Controlled with peripheral neuropathy and circulatory dysfunction, CKD stage II, Hypertension, venous statis dermatitis, history of LE ulcers with reoccurance, Hypothyroidism. Her most recent Hba1c on 10/10/14 was 6.6% meeting goal of Hba1c <8% age adjusted. Current diabetes medication regimen includes Metformin 1000 mg po bid along with diet and lifestyle changes. Patient is taking medications as instructed. Overall the patient feels that their blood glucose is well controlled. Not checking home blood glucose regularly. She is checking her feet daily and sees a podiatry specialist as well. She notes dryness to her feet, abrasions, early stages on ulcers, crooked toes and flat feet and reduced sensation in her feet.  Patient Active Problem List   Diagnosis Date Noted  . Mixed incontinence 11/26/2014  . Atrophic vaginitis 11/26/2014  . Myelolipoma of right adrenal gland 11/26/2014  . Myelolipoma of adrenal gland 11/26/2014  . Comprehensive diabetic foot examination, type 2 DM, encounter for 11/01/2014  . Marital problems 10/25/2014  . Depression 10/24/2014  . Anxiety and depression 09/21/2014  . Atrial fibrillation, controlled 09/21/2014  . Chronic kidney disease 09/21/2014  . Allergic contact dermatitis 09/21/2014  . Bladder cystocele 09/21/2014  . Gastro-esophageal reflux disease without esophagitis 09/21/2014  . History of cellulitis 09/21/2014  . H/O  deep venous thrombosis 09/21/2014  . Personal history of surgery to heart and great vessels, presenting hazards to health 09/21/2014  . H/O fall 09/21/2014  . Cannot sleep 09/21/2014  . Neuropathy involving both lower extremities 09/21/2014  . Extreme obesity 09/21/2014  . Hernia, rectovaginal 09/21/2014  . Vascular disorder of lower extremity 09/21/2014  . Candidal dermatitis 09/21/2014  . Dryness of vagina 09/21/2014  . Ulcer of lower extremity 09/21/2014  . DM (diabetes mellitus) type II controlled peripheral vascular disorder 08/31/2014  . Diabetic peripheral neuropathy associated with type 2 diabetes mellitus 08/31/2014  . Asthma, moderate persistent, well-controlled 08/31/2014  . Hypothyroidism, adult 08/31/2014  . Peripheral vascular disease due to secondary diabetes mellitus 12/11/2013  . Apnea, sleep 10/19/2013  . Venous stasis dermatitis of both lower extremities 10/06/2013  . Paroxysmal atrial fibrillation 07/27/2013  . Hypertension goal BP (blood pressure) < 150/90 07/27/2013  . Hyperhidrosis - unclear etiology 07/27/2013    Social History  Substance Use Topics  . Smoking status: Never Smoker   . Smokeless tobacco: Never Used  . Alcohol Use: No     Current outpatient prescriptions:  .  ALPRAZolam (XANAX) 0.5 MG tablet, TAKE ONE TABLET TWICE DAILY AS NEEDED, Disp: 60 tablet, Rfl: 5 .  buPROPion (WELLBUTRIN XL) 300 MG 24 hr tablet, TAKE ONE TABLET BY MOUTH EVERY DAY, Disp: 30 tablet, Rfl: 5 .  dabigatran (PRADAXA) 150 MG CAPS capsule, Take 150 mg by mouth 2 (two) times daily., Disp: , Rfl:  .  DULoxetine (CYMBALTA) 60 MG capsule, TAKE 1 CAPSULE EVERY DAY, Disp: 30 capsule, Rfl: 5 .  estradiol (ESTRACE VAGINAL) 0.1 MG/GM vaginal cream, Place 1  application vaginally 2 (two) times a week., Disp: , Rfl:  .  gabapentin (NEURONTIN) 600 MG tablet, , Disp: , Rfl:  .  HYDROcodone-acetaminophen (NORCO/VICODIN) 5-325 MG per tablet, , Disp: , Rfl:  .  levothyroxine (SYNTHROID,  LEVOTHROID) 150 MCG tablet, Take 150 mcg by mouth daily before breakfast., Disp: , Rfl:  .  Lorcaserin HCl (BELVIQ) 10 MG TABS, Take 1 tablet by mouth 2 (two) times daily., Disp: , Rfl:  .  meloxicam (MOBIC) 7.5 MG tablet, Take 7.5 mg by mouth daily., Disp: , Rfl:  .  metFORMIN (GLUCOPHAGE) 1000 MG tablet, Take 1 tablet by mouth 2 (two) times daily., Disp: , Rfl:  .  mirabegron ER (MYRBETRIQ) 25 MG TB24 tablet, Take 1 tablet (25 mg total) by mouth daily., Disp: 30 tablet, Rfl: 12 .  montelukast (SINGULAIR) 10 MG tablet, TAKE ONE TABLET EVERY DAY, Disp: 90 tablet, Rfl: 2 .  MYRBETRIQ 25 MG TB24 tablet, TAKE ONE TABLET EVERY DAY, Disp: 30 tablet, Rfl: 3 .  pantoprazole (PROTONIX) 40 MG tablet, Take 1 tablet (40 mg total) by mouth daily., Disp: 90 tablet, Rfl: 2 .  quinapril (ACCUPRIL) 20 MG tablet, Take 20 mg by mouth at bedtime., Disp: , Rfl:  .  warfarin (COUMADIN) 5 MG tablet, , Disp: , Rfl:  .  zolpidem (AMBIEN CR) 12.5 MG CR tablet, Take 12.5 mg by mouth at bedtime as needed for sleep., Disp: , Rfl:   Past Surgical History  Procedure Laterality Date  . Hernia repair    . Ankle surgery Right   . Vaginal hysterectomy    . Sleeve gastroplasty    . Lithotripsy    . Kidney surgery    . Transthoracic echocardiogram  08/03/2013    Mild-Moderate concentric LVH, EF 60-65%. Normal diastolic function. Mild LA dilation. Mild MR with possible vegetation  - not confirmed on TEE   . Transesophageal echocardiogram  08/08/2013    Mild LVH, EF 60-65%. Moderate LA dilation and mild RA dilation. Mild MR with no evidence of stenosis and no evidence of endocarditis. A false chordae is noted.  . Tonsillectomy    . Total knee arthroplasty      Family History  Problem Relation Age of Onset  . Skin cancer Father   . Diabetes Father   . Hypertension Father   . Peripheral vascular disease Father   . Cancer Father   . Cerebral aneurysm Father   . Varicose Veins Mother   . Kidney disease Mother   . Mental  illness Sister   . Cancer Maternal Aunt     breast  . Breast cancer Maternal Aunt     Allergies  Allergen Reactions  . Aspirin Hives  . Penicillins Hives  . Terramycin [Oxytetracycline] Hives     Review of Systems  CONSTITUTIONAL: No significant weight changes, fever, chills, weakness. Yes fatigue.  HEENT:  - Eyes: No visual changes.  - Ears: No auditory changes. No pain.  - Nose: No sneezing, congestion, runny nose. - Throat: No sore throat. No changes in swallowing. SKIN: Yes skin infection of right leg. CARDIOVASCULAR: No chest pain, chest pressure or chest discomfort. No palpitations or edema.  RESPIRATORY: No shortness of breath, cough or sputum.  GASTROINTESTINAL: No anorexia, nausea, vomiting. No changes in bowel habits. No abdominal pain or blood.  GENITOURINARY: No dysuria. No frequency. No discharge.  NEUROLOGICAL: No headache, dizziness, syncope, paralysis, ataxia, numbness or tingling in the extremities. No memory changes. No change in bowel or bladder control.  MUSCULOSKELETAL: Chronic joint pain. No muscle pain. HEMATOLOGIC: No anemia, bleeding or bruising.  LYMPHATICS: No enlarged lymph nodes.  PSYCHIATRIC: No change in mood. No change in sleep pattern.  ENDOCRINOLOGIC: No reports of sweating, cold or heat intolerance. No polyuria or polydipsia.     Objective  BP 128/64 mmHg  Pulse 81  Temp(Src) 98.5 F (36.9 C)  Resp 16  Ht 5\' 4"  (1.626 m)  Wt 249 lb 7 oz (113.144 kg)  BMI 42.79 kg/m2  SpO2 94% Body mass index is 42.79 kg/(m^2).  Physical Exam  Constitutional: Patient obese and well-nourished. In no distress.  HEENT:  - Head: Normocephalic and atraumatic.  - Ears: Bilateral TMs gray, no erythema or effusion - Nose: Nasal mucosa moist - Mouth/Throat: Oropharynx is clear and moist. No tonsillar hypertrophy or erythema. No post nasal drainage.  - Eyes: Conjunctivae clear, EOM movements normal. PERRLA. No scleral icterus.  Neck: Normal range of  motion. Neck supple. No JVD present. No thyromegaly present.  Cardiovascular: Normal rate, regular rhythm and normal heart sounds.  No murmur heard.  Pulmonary/Chest: Effort normal and breath sounds normal. No respiratory distress. Musculoskeletal: Normal range of motion bilateral UE and LE, no joint effusions. Peripheral vascular: Bilateral LE no edema. Neurological: CN II-XII grossly intact with no focal deficits. Alert and oriented to person, place, and time. Coordination, balance, strength, speech and gait are normal.  Skin: Right lower extremity with tenderness, warmth, erythema, weeping clear fluid in areas, well demarcated border of area of erythema at upper shins not involving the knee or foot. Entire circumference of leg. Psychiatric: Patient has a normal mood and affect. Behavior is normal in office today. Judgment and thought content normal in office today.   Recent Results (from the past 2160 hour(s))  POCT Urinalysis Dipstick     Status: Normal   Collection Time: 09/21/14  4:13 PM  Result Value Ref Range   Color, UA YELLOW    Clarity, UA CLEAR    Glucose, UA NEGATIVE    Bilirubin, UA NEGATIVE    Ketones, UA NEGATIVE    Spec Grav, UA 1.020    Blood, UA NEGATIVE    pH, UA 6.0    Protein, UA NEGATIVE    Urobilinogen, UA 0.2    Nitrite, UA NEGATIVE    Leukocytes, UA Negative Negative  CBC with Differential/Platelet     Status: None   Collection Time: 10/10/14  8:22 AM  Result Value Ref Range   WBC 5.9 3.4 - 10.8 x10E3/uL   RBC 4.36 3.77 - 5.28 x10E6/uL   Hemoglobin 12.5 11.1 - 15.9 g/dL   Hematocrit 36.3 34.0 - 46.6 %   MCV 83 79 - 97 fL   MCH 28.7 26.6 - 33.0 pg   MCHC 34.4 31.5 - 35.7 g/dL   RDW 14.8 12.3 - 15.4 %   Platelets 263 150 - 379 x10E3/uL   Neutrophils 64 %   Lymphs 20 %   Monocytes 9 %   Eos 6 %   Basos 1 %   Neutrophils Absolute 3.8 1.4 - 7.0 x10E3/uL   Lymphocytes Absolute 1.2 0.7 - 3.1 x10E3/uL   Monocytes Absolute 0.5 0.1 - 0.9 x10E3/uL   EOS  (ABSOLUTE) 0.4 0.0 - 0.4 x10E3/uL   Basophils Absolute 0.1 0.0 - 0.2 x10E3/uL   Immature Granulocytes 0 %   Immature Grans (Abs) 0.0 0.0 - 0.1 x10E3/uL  Comprehensive metabolic panel     Status: Abnormal   Collection Time: 10/10/14  8:22 AM  Result Value Ref Range   Glucose 122 (H) 65 - 99 mg/dL   BUN 16 8 - 27 mg/dL   Creatinine, Ser 0.79 0.57 - 1.00 mg/dL   GFR calc non Af Amer 79 >59 mL/min/1.73   GFR calc Af Amer 91 >59 mL/min/1.73   BUN/Creatinine Ratio 20 11 - 26   Sodium 143 134 - 144 mmol/L   Potassium 4.9 3.5 - 5.2 mmol/L   Chloride 102 97 - 108 mmol/L   CO2 23 18 - 29 mmol/L   Calcium 9.3 8.7 - 10.3 mg/dL   Total Protein 6.5 6.0 - 8.5 g/dL   Albumin 4.2 3.6 - 4.8 g/dL   Globulin, Total 2.3 1.5 - 4.5 g/dL   Albumin/Globulin Ratio 1.8 1.1 - 2.5   Bilirubin Total 0.3 0.0 - 1.2 mg/dL   Alkaline Phosphatase 88 39 - 117 IU/L   AST 22 0 - 40 IU/L   ALT 17 0 - 32 IU/L  TSH     Status: None   Collection Time: 10/10/14  8:22 AM  Result Value Ref Range   TSH 1.750 0.450 - 4.500 uIU/mL  Lipid panel     Status: None   Collection Time: 10/10/14  8:22 AM  Result Value Ref Range   Cholesterol, Total 185 100 - 199 mg/dL   Triglycerides 89 0 - 149 mg/dL   HDL 94 >39 mg/dL    Comment: According to ATP-III Guidelines, HDL-C >59 mg/dL is considered a negative risk factor for CHD.    VLDL Cholesterol Cal 18 5 - 40 mg/dL   LDL Calculated 73 0 - 99 mg/dL   Chol/HDL Ratio 2.0 0.0 - 4.4 ratio units    Comment:                                   T. Chol/HDL Ratio                                             Men  Women                               1/2 Avg.Risk  3.4    3.3                                   Avg.Risk  5.0    4.4                                2X Avg.Risk  9.6    7.1                                3X Avg.Risk 23.4   11.0   T3, free     Status: None   Collection Time: 10/10/14  8:22 AM  Result Value Ref Range   T3, Free 2.1 2.0 - 4.4 pg/mL  T4, free     Status: None    Collection Time: 10/10/14  8:22 AM  Result Value Ref Range   Free T4 1.33 0.82 - 1.77 ng/dL  HgB A1c  Status: Abnormal   Collection Time: 10/10/14  8:22 AM  Result Value Ref Range   Hgb A1c MFr Bld 6.6 (H) 4.8 - 5.6 %    Comment:          Pre-diabetes: 5.7 - 6.4          Diabetes: >6.4          Glycemic control for adults with diabetes: <7.0    Est. average glucose Bld gHb Est-mCnc 143 mg/dL  Urinalysis, Complete     Status: None   Collection Time: 11/09/14 11:00 AM  Result Value Ref Range   Specific Gravity, UA 1.025 1.005 - 1.030   pH, UA 6.0 5.0 - 7.5   Color, UA Yellow Yellow   Appearance Ur Clear Clear   Leukocytes, UA Negative Negative   Protein, UA Negative Negative/Trace   Glucose, UA Negative Negative   Ketones, UA Negative Negative   RBC, UA Negative Negative   Bilirubin, UA Negative Negative   Urobilinogen, Ur 0.2 0.2 - 1.0 mg/dL   Nitrite, UA Negative Negative   Microscopic Examination See below:   Microscopic Examination     Status: None   Collection Time: 11/09/14 11:00 AM  Result Value Ref Range   WBC, UA None seen 0 -  5 /hpf   RBC, UA None seen 0 -  2 /hpf   Epithelial Cells (non renal) 0-10 0 - 10 /hpf   Bacteria, UA None seen None seen/Few  BLADDER SCAN AMB NON-IMAGING     Status: None   Collection Time: 11/09/14 11:28 AM  Result Value Ref Range   Scan Result 156ml      Assessment & Plan  1. Cellulitis of right lower extremity Has cellulitis infection of her legs about 2-3 times a year due to other medical co morbidities such as DM with neuropathy, poor circulation in legs, venous stasis dermatitis. Today I gave her Rocephin 1 gram in office and she is to start Bactrim DS tomorrow for 10 days and follow up with me closely. Vitals stable.  - sulfamethoxazole-trimethoprim (BACTRIM DS,SEPTRA DS) 800-160 MG per tablet; Take 1 tablet by mouth 2 (two) times daily.  Dispense: 20 tablet; Refill: 0 - cefTRIAXone (ROCEPHIN) injection 1 g; Inject 1 g into  the muscle once.  2. Need for influenza vaccination  - Flu vaccine HIGH DOSE PF (Fluzone High dose)

## 2014-12-02 ENCOUNTER — Telehealth: Payer: Self-pay | Admitting: Urology

## 2014-12-02 DIAGNOSIS — E278 Other specified disorders of adrenal gland: Secondary | ICD-10-CM

## 2014-12-02 NOTE — Telephone Encounter (Signed)
Patient has a history of adrenal myelolipomas.  I discussed with Dr. Pilar Jarvis if any further follow-up was needed for these lesions. He recommended an adrenal ultrasound.  These contact the patient to schedule the ultrasound.

## 2014-12-05 ENCOUNTER — Ambulatory Visit (INDEPENDENT_AMBULATORY_CARE_PROVIDER_SITE_OTHER): Payer: PPO | Admitting: Family Medicine

## 2014-12-05 ENCOUNTER — Encounter: Payer: Self-pay | Admitting: Family Medicine

## 2014-12-05 VITALS — BP 148/80 | HR 78 | Temp 98.7°F | Resp 16 | Ht 63.0 in | Wt 248.8 lb

## 2014-12-05 DIAGNOSIS — L03115 Cellulitis of right lower limb: Secondary | ICD-10-CM

## 2014-12-05 DIAGNOSIS — I8311 Varicose veins of right lower extremity with inflammation: Secondary | ICD-10-CM

## 2014-12-05 DIAGNOSIS — R600 Localized edema: Secondary | ICD-10-CM | POA: Diagnosis not present

## 2014-12-05 DIAGNOSIS — I8312 Varicose veins of left lower extremity with inflammation: Secondary | ICD-10-CM

## 2014-12-05 DIAGNOSIS — I872 Venous insufficiency (chronic) (peripheral): Secondary | ICD-10-CM

## 2014-12-05 NOTE — Progress Notes (Signed)
Name: [name]   MRN: [id number]    DOB: March 14, 1951   Date:12/05/2014       Progress Note  Subjective  Chief Complaint  Chief Complaint  Patient presents with  . Follow-up    patient is here for a 63-month follow-up. patient also wants to review her bone density results.    HPI  [name] is a pleasant 64 year old female who is here today with follow up of recurrent cellulitis of her lower extremities. Right leg only this time. Not associated with fevers, chills, nausea, vomiting. Does feel fatigued. Onset 10 days ago slowly improving. Given IM Rocephin 1 gram in office last week and is on oral Bactrim DS. She has a current relevant medical diagnosis of DM II Controlled with peripheral neuropathy and circulatory dysfunction, CKD stage II, Hypertension, venous statis dermatitis, history of LE ulcers with reoccurance, Hypothyroidism. She is checking her feet daily and sees a podiatry specialist as well. She notes dryness to her feet, abrasions, early stages on ulcers, crooked toes and flat feet and reduced sensation in her feet.  Recently DEXA scan was done 10/17/14 showing no osteopenia.   Past Medical History  Diagnosis Date  . Sinus problem   . Swelling   . Thyroid disease   . Diabetes   . Venous stasis dermatitis of both lower extremities   . Hx of blood clots   . HBP (high blood pressure)   . Kidney disease   . Depression   . Dysthymic disorder   . Paroxysmal atrial fibrillation 2007    In June 2015, Cardiac Event Monitor: Mostly SR/sinus arrhythmia with PVCs that are frequent. Short bursts of A. fib lasting several minutes  . Asthma   . GERD (gastroesophageal reflux disease)   . Heart murmur     Echocardiogram June 2015: Mild MR (possible vegetation seen on TTE, not seen on TEE), normal LV size with moderate concentric LVH. Normal function EF 60-65%. Normal diastolic function. Mild LA dilation.  . DVT (deep venous thrombosis)   . Insomnia   . Atrial fibrillation,  controlled   . Cystocele   . Neuropathy involving both lower extremities   . Anxiety and depression   . Obesity, Class II, BMI 35-39.9, with comorbidity   . Cholecystolithiasis   . Vaginitis   . Kidney stone   . Adrenal mass     Past Surgical History  Procedure Laterality Date  . Hernia repair    . Ankle surgery Right   . Vaginal hysterectomy    . Sleeve gastroplasty    . Lithotripsy    . Kidney surgery    . Transthoracic echocardiogram  08/03/2013    Mild-Moderate concentric LVH, EF 60-65%. Normal diastolic function. Mild LA dilation. Mild MR with possible vegetation  - not confirmed on TEE   . Transesophageal echocardiogram  08/08/2013    Mild LVH, EF 60-65%. Moderate LA dilation and mild RA dilation. Mild MR with no evidence of stenosis and no evidence of endocarditis. A false chordae is noted.  . Tonsillectomy    . Total knee arthroplasty      Family History  Problem Relation Age of Onset  . Skin cancer Father   . Diabetes Father   . Hypertension Father   . Peripheral vascular disease Father   . Cancer Father   . Cerebral aneurysm Father   . Varicose Veins Mother   . Kidney disease Mother   . Mental illness Sister   . Cancer  Maternal Aunt     breast  . Breast cancer Maternal Aunt     Social History   Social History  . Marital Status: Married    Spouse Name: N/A  . Number of Children: N/A  . Years of Education: N/A   Occupational History  . Not on file.   Social History Main Topics  . Smoking status: Never Smoker   . Smokeless tobacco: Never Used  . Alcohol Use: No  . Drug Use: No  . Sexual Activity: Not on file   Other Topics Concern  . Not on file   Social History Narrative   She is currently married -- for 28 years. Does not work. Does not smoke or take alcohol. She never smoked. She exercises at least 3 days a week since before her gastric surgery.   Marital status reviewed in history of present illness.     Current outpatient  prescriptions:  .  ALPRAZolam (XANAX) 0.5 MG tablet, TAKE ONE TABLET TWICE DAILY AS NEEDED, Disp: 60 tablet, Rfl: 5 .  buPROPion (WELLBUTRIN XL) 300 MG 24 hr tablet, TAKE ONE TABLET BY MOUTH EVERY DAY, Disp: 30 tablet, Rfl: 5 .  dabigatran (PRADAXA) 150 MG CAPS capsule, Take 150 mg by mouth 2 (two) times daily., Disp: , Rfl:  .  DULoxetine (CYMBALTA) 60 MG capsule, TAKE 1 CAPSULE EVERY DAY, Disp: 30 capsule, Rfl: 5 .  estradiol (ESTRACE VAGINAL) 0.1 MG/GM vaginal cream, Place 1 application vaginally 2 (two) times a week., Disp: , Rfl:  .  gabapentin (NEURONTIN) 600 MG tablet, , Disp: , Rfl:  .  HYDROcodone-acetaminophen (NORCO/VICODIN) 5-325 MG per tablet, , Disp: , Rfl:  .  levothyroxine (SYNTHROID, LEVOTHROID) 150 MCG tablet, Take 150 mcg by mouth daily before breakfast., Disp: , Rfl:  .  Lorcaserin HCl (BELVIQ) 10 MG TABS, Take 1 tablet by mouth 2 (two) times daily., Disp: , Rfl:  .  meloxicam (MOBIC) 7.5 MG tablet, Take 7.5 mg by mouth daily., Disp: , Rfl:  .  metFORMIN (GLUCOPHAGE) 1000 MG tablet, Take 1 tablet by mouth 2 (two) times daily., Disp: , Rfl:  .  mirabegron ER (MYRBETRIQ) 25 MG TB24 tablet, Take 1 tablet (25 mg total) by mouth daily., Disp: 30 tablet, Rfl: 12 .  montelukast (SINGULAIR) 10 MG tablet, TAKE ONE TABLET EVERY DAY, Disp: 90 tablet, Rfl: 2 .  MYRBETRIQ 25 MG TB24 tablet, TAKE ONE TABLET EVERY DAY, Disp: 30 tablet, Rfl: 3 .  pantoprazole (PROTONIX) 40 MG tablet, Take 1 tablet (40 mg total) by mouth daily., Disp: 90 tablet, Rfl: 2 .  quinapril (ACCUPRIL) 20 MG tablet, Take 20 mg by mouth at bedtime., Disp: , Rfl:  .  sulfamethoxazole-trimethoprim (BACTRIM DS,SEPTRA DS) 800-160 MG per tablet, Take 1 tablet by mouth 2 (two) times daily., Disp: 20 tablet, Rfl: 0 .  warfarin (COUMADIN) 5 MG tablet, , Disp: , Rfl:  .  zolpidem (AMBIEN CR) 12.5 MG CR tablet, Take 12.5 mg by mouth at bedtime as needed for sleep., Disp: , Rfl:   Allergies  Allergen Reactions  . Aspirin Hives   . Penicillins Hives  . Terramycin [Oxytetracycline] Hives     ROS  CONSTITUTIONAL: No significant weight changes, fever, chills, weakness. Yes fatigue.  HEENT:  - Eyes: No visual changes.  - Ears: No auditory changes. No pain.  - Nose: No sneezing, congestion, runny nose. - Throat: No sore throat. No changes in swallowing. SKIN: Yes skin infection of right leg. CARDIOVASCULAR: No chest pain, chest  pressure or chest discomfort. No palpitations or edema.  RESPIRATORY: No shortness of breath, cough or sputum.  GASTROINTESTINAL: No anorexia, nausea, vomiting. No changes in bowel habits. No abdominal pain or blood.  GENITOURINARY: No dysuria. No frequency. No discharge.  NEUROLOGICAL: No headache, dizziness, syncope, paralysis, ataxia, numbness or tingling in the extremities. No memory changes. No change in bowel or bladder control.  MUSCULOSKELETAL: Chronic joint pain. No muscle pain. HEMATOLOGIC: No anemia, bleeding or bruising.  LYMPHATICS: No enlarged lymph nodes.  PSYCHIATRIC: No change in mood. No change in sleep pattern.  ENDOCRINOLOGIC: No reports of sweating, cold or heat intolerance. No polyuria or polydipsia.    Objective  Filed Vitals:   12/05/14 1103  BP: 148/80  Pulse: 78  Temp: 98.7 F (37.1 C)  TempSrc: Oral  Resp: 16  Height: 5\' 3"  (1.6 m)  Weight: 248 lb 12.8 oz (112.855 kg)  SpO2: 98%    Physical Exam  Constitutional: Patient obese and well-nourished. In no distress.  HEENT:  - Head: Normocephalic and atraumatic.  - Ears: Bilateral TMs gray, no erythema or effusion - Nose: Nasal mucosa moist - Mouth/Throat: Oropharynx is clear and moist. No tonsillar hypertrophy or erythema. No post nasal drainage.  - Eyes: Conjunctivae clear, EOM movements normal. PERRLA. No scleral icterus.  Neck: Normal range of motion. Neck supple. No JVD present. No thyromegaly present.  Cardiovascular: Normal rate, regular rhythm and normal heart sounds. No murmur  heard.  Pulmonary/Chest: Effort normal and breath sounds normal. No respiratory distress. Musculoskeletal: Normal range of motion bilateral UE and LE, no joint effusions. Peripheral vascular: Bilateral LE no edema. Neurological: CN II-XII grossly intact with no focal deficits. Alert and oriented to person, place, and time. Coordination, balance, strength, speech and gait are normal.  Skin: Right lower extremity with IMPROVED tenderness, warmth, erythema, RECEDING well demarcated border of area of erythema at upper shins not involving the knee or foot. NO LONGER entire circumference of leg. Some mild swelling at right calf area with negative Holman's sign. Psychiatric: Patient has a normal mood and affect. Behavior is normal in office today. Judgment and thought content normal in office today.    Assessment & Plan  1. Venous stasis dermatitis of both lower extremities Keep legs elevated whenever possible to promote circulation.  2. Cellulitis of right lower extremity Significant improvement, finished antibiotics.  3. Edema of right lower extremity Mild edema right calf area, instructed patient to use her Furosemide 20 mg 1/2 to 1 tablets by mouth daily for 3-5 days.

## 2014-12-05 NOTE — Telephone Encounter (Signed)
Spoke with pt and MRI ordered instead of ultrasound, Per Dr. Pilar Jarvis and [name]. Pt made aware.

## 2014-12-14 ENCOUNTER — Ambulatory Visit
Admission: RE | Admit: 2014-12-14 | Discharge: 2014-12-14 | Disposition: A | Discharge status: Home / Self Care | Payer: PPO | Source: Ambulatory Visit | Attending: Urology | Admitting: Urology

## 2014-12-14 ENCOUNTER — Other Ambulatory Visit
Admission: RE | Admit: 2014-12-14 | Discharge: 2014-12-14 | Disposition: A | Discharge status: Home / Self Care | Payer: PPO | Source: Ambulatory Visit | Attending: Urology | Admitting: Urology

## 2014-12-14 ENCOUNTER — Other Ambulatory Visit: Payer: Self-pay | Admitting: Urology

## 2014-12-14 DIAGNOSIS — Z7689 Persons encountering health services in other specified circumstances: Secondary | ICD-10-CM | POA: Insufficient documentation

## 2014-12-14 DIAGNOSIS — E278 Other specified disorders of adrenal gland: Secondary | ICD-10-CM

## 2014-12-14 DIAGNOSIS — E279 Disorder of adrenal gland, unspecified: Secondary | ICD-10-CM | POA: Insufficient documentation

## 2014-12-14 DIAGNOSIS — N289 Disorder of kidney and ureter, unspecified: Secondary | ICD-10-CM

## 2014-12-14 LAB — CREATININE, SERUM
CREATININE: 0.71 mg/dL (ref 0.44–1.00)
GFR calc Af Amer: >60 mL/min (ref >60)
GFR calc non Af Amer: >60 mL/min (ref >60)

## 2014-12-14 LAB — BUN: BUN: 18 mg/dL (ref 6–20)

## 2014-12-14 MED ORDER — GADOBENATE DIMEGLUMINE 529 MG/ML IV SOLN
20.0000 mL | Freq: Once | INTRAVENOUS | Status: AC | PRN
Start: 2014-12-14 — End: 2014-12-14
  Administered 2014-12-14: 20 mL via INTRAVENOUS

## 2014-12-17 ENCOUNTER — Ambulatory Visit
Admission: RE | Admit: 2014-12-17 | Discharge: 2014-12-17 | Disposition: A | Discharge status: Home / Self Care | Payer: PPO | Source: Ambulatory Visit | Attending: Family Medicine | Admitting: Family Medicine

## 2014-12-17 ENCOUNTER — Ambulatory Visit (INDEPENDENT_AMBULATORY_CARE_PROVIDER_SITE_OTHER): Payer: PPO | Admitting: Family Medicine

## 2014-12-17 ENCOUNTER — Encounter: Payer: Self-pay | Admitting: Family Medicine

## 2014-12-17 VITALS — BP 142/76 | HR 68 | Temp 97.0°F | Resp 18 | Ht 64.0 in | Wt 248.3 lb

## 2014-12-17 DIAGNOSIS — R05 Cough: Secondary | ICD-10-CM

## 2014-12-17 DIAGNOSIS — I739 Peripheral vascular disease, unspecified: Secondary | ICD-10-CM | POA: Diagnosis not present

## 2014-12-17 DIAGNOSIS — Z23 Encounter for immunization: Secondary | ICD-10-CM | POA: Diagnosis not present

## 2014-12-17 DIAGNOSIS — R058 Other specified cough: Secondary | ICD-10-CM

## 2014-12-17 DIAGNOSIS — B373 Candidiasis of vulva and vagina: Secondary | ICD-10-CM

## 2014-12-17 DIAGNOSIS — E1159 Type 2 diabetes mellitus with other circulatory complications: Secondary | ICD-10-CM

## 2014-12-17 DIAGNOSIS — L98499 Non-pressure chronic ulcer of skin of other sites with unspecified severity: Secondary | ICD-10-CM

## 2014-12-17 DIAGNOSIS — F339 Major depressive disorder, recurrent, unspecified: Secondary | ICD-10-CM | POA: Diagnosis not present

## 2014-12-17 DIAGNOSIS — I70209 Unspecified atherosclerosis of native arteries of extremities, unspecified extremity: Secondary | ICD-10-CM

## 2014-12-17 DIAGNOSIS — B3731 Acute candidiasis of vulva and vagina: Secondary | ICD-10-CM

## 2014-12-17 DIAGNOSIS — E1151 Type 2 diabetes mellitus with diabetic peripheral angiopathy without gangrene: Secondary | ICD-10-CM

## 2014-12-17 MED ORDER — PREDNISONE 10 MG PO TABS: 10.0000 mg | ORAL_TABLET | Freq: Every day | ORAL | Status: DC

## 2014-12-17 MED ORDER — FLUCONAZOLE 150 MG PO TABS
150.0000 mg | ORAL_TABLET | ORAL | Status: DC
Start: 2014-12-17 — End: 2015-01-02

## 2014-12-17 MED ORDER — LEVOFLOXACIN 500 MG PO TABS: 500.0000 mg | ORAL_TABLET | Freq: Every day | ORAL | Status: DC

## 2014-12-17 NOTE — Progress Notes (Signed)
Name: [name]   MRN: [id number]    DOB: November 18, 1950   Date:12/17/2014       Progress Note  Subjective  Chief Complaint  Chief Complaint  Patient presents with  . URI    Onset Friday, Cough-Green mucus, Right Ear pressure/popping, wheezing and headache. Unchanged.  Been taking Tussionex perles from a previous visit and a old inhaler Proair, which helped symptoms  . Foot Ulcer    Right Foot and on second toe, Onset-2 weeks getting worst and painful.    . Wound Check    Would like to check bilateral calves due to having cellulitis and see her legs have improved since taking the Bactrim.     HPI   URI: started last week, initially just clear rhinorrhea, followed by facial pressure and right ear pain, however over the past few days she has noticed a productive cough, green sputum, and fatigue . She has also noticed some SOB with activity and used a rescue inhaler with mild improvement. Also taking Tessalon perles to help with the cough  PAD with ulceration: seen by Dr. Nadine Counts a couple of weeks ago with cellulitis of right leg, she was given Sulfa and redness and pain has decreased but still has some ulcerations on right calf.  Also has a second left toe ulcer that started after wearing poor fitted shoes, the blood blister ruptured and now has an ulcer, it has been present for the past month. She sees Dr. Jens Som, also has a vascular surgeon in [place]  Major Depression: she is not in remission, symptoms started 35 years ago when she lost twin sons ( premature kids ), she felt devastated.  She remarried, did well for a while, but over the past 8- 10 years she started to feel worse again, also married not doing well over the past couple of years, because of his gambling problems. She has crying spells.   Patient Active Problem List   Diagnosis Date Noted  . Atherosclerotic PVD with ulceration 12/17/2014  . Productive cough 12/17/2014  . Edema of right lower extremity 12/05/2014  .  Need for influenza vaccination 11/28/2014  . Mixed incontinence 11/26/2014  . Atrophic vaginitis 11/26/2014  . Myelolipoma of right adrenal gland 11/26/2014  . Comprehensive diabetic foot examination, type 2 DM, encounter for 11/01/2014  . Marital problems 10/25/2014  . Major depression, recurrent, chronic 10/24/2014  . Anxiety and depression 09/21/2014  . Chronic kidney disease 09/21/2014  . Allergic contact dermatitis 09/21/2014  . Bladder cystocele 09/21/2014  . Gastro-esophageal reflux disease without esophagitis 09/21/2014  . History of cellulitis 09/21/2014  . H/O deep venous thrombosis 09/21/2014  . Personal history of surgery to heart and great vessels, presenting hazards to health 09/21/2014  . H/O fall 09/21/2014  . Cannot sleep 09/21/2014  . Neuropathy involving both lower extremities 09/21/2014  . Extreme obesity 09/21/2014  . Hernia, rectovaginal 09/21/2014  . Vascular disorder of lower extremity 09/21/2014  . Candidal dermatitis 09/21/2014  . Dryness of vagina 09/21/2014  . Ulcer of lower extremity 09/21/2014  . DM (diabetes mellitus) type II controlled peripheral vascular disorder 08/31/2014  . Diabetic peripheral neuropathy associated with type 2 diabetes mellitus 08/31/2014  . Asthma, moderate persistent, well-controlled 08/31/2014  . Hypothyroidism, adult 08/31/2014  . Peripheral vascular disease due to secondary diabetes mellitus 12/11/2013  . Apnea, sleep 10/19/2013  . Venous stasis dermatitis of both lower extremities 10/06/2013  . Paroxysmal atrial fibrillation 07/27/2013  . Hypertension goal BP (blood pressure) < 150/90  07/27/2013  . Hyperhidrosis - unclear etiology 07/27/2013    Past Surgical History  Procedure Laterality Date  . Hernia repair    . Ankle surgery Right   . Vaginal hysterectomy    . Sleeve gastroplasty    . Lithotripsy    . Kidney surgery    . Transthoracic echocardiogram  08/03/2013    Mild-Moderate concentric LVH, EF 60-65%. Normal  diastolic function. Mild LA dilation. Mild MR with possible vegetation  - not confirmed on TEE   . Transesophageal echocardiogram  08/08/2013    Mild LVH, EF 60-65%. Moderate LA dilation and mild RA dilation. Mild MR with no evidence of stenosis and no evidence of endocarditis. A false chordae is noted.  . Tonsillectomy    . Total knee arthroplasty      Family History  Problem Relation Age of Onset  . Skin cancer Father   . Diabetes Father   . Hypertension Father   . Peripheral vascular disease Father   . Cancer Father   . Cerebral aneurysm Father   . Varicose Veins Mother   . Kidney disease Mother   . Mental illness Sister   . Cancer Maternal Aunt     breast  . Breast cancer Maternal Aunt     Social History   Social History  . Marital Status: Married    Spouse Name: N/A  . Number of Children: N/A  . Years of Education: N/A   Occupational History  . Not on file.   Social History Main Topics  . Smoking status: Never Smoker   . Smokeless tobacco: Never Used  . Alcohol Use: No  . Drug Use: No  . Sexual Activity: No   Other Topics Concern  . Not on file   Social History Narrative   She is currently married -- for 28 years. Does not work. Does not smoke or take alcohol. She never smoked. She exercises at least 3 days a week since before her gastric surgery.   Marital status reviewed in history of present illness.     Current outpatient prescriptions:  .  ALPRAZolam (XANAX) 0.5 MG tablet, TAKE ONE TABLET TWICE DAILY AS NEEDED, Disp: 60 tablet, Rfl: 5 .  buPROPion (WELLBUTRIN XL) 300 MG 24 hr tablet, TAKE ONE TABLET BY MOUTH EVERY DAY, Disp: 30 tablet, Rfl: 5 .  dabigatran (PRADAXA) 150 MG CAPS capsule, Take 150 mg by mouth 2 (two) times daily., Disp: , Rfl:  .  DULoxetine (CYMBALTA) 60 MG capsule, TAKE 1 CAPSULE EVERY DAY, Disp: 30 capsule, Rfl: 5 .  estradiol (ESTRACE VAGINAL) 0.1 MG/GM vaginal cream, Place 1 application vaginally 2 (two) times a week., Disp: , Rfl:   .  gabapentin (NEURONTIN) 600 MG tablet, , Disp: , Rfl:  .  HYDROcodone-acetaminophen (NORCO/VICODIN) 5-325 MG per tablet, , Disp: , Rfl:  .  levothyroxine (SYNTHROID, LEVOTHROID) 150 MCG tablet, Take 150 mcg by mouth daily before breakfast., Disp: , Rfl:  .  Lorcaserin HCl (BELVIQ) 10 MG TABS, Take 1 tablet by mouth 2 (two) times daily., Disp: , Rfl:  .  meloxicam (MOBIC) 7.5 MG tablet, Take 7.5 mg by mouth daily., Disp: , Rfl:  .  metFORMIN (GLUCOPHAGE) 1000 MG tablet, Take 1 tablet by mouth 2 (two) times daily., Disp: , Rfl:  .  mirabegron ER (MYRBETRIQ) 25 MG TB24 tablet, Take 1 tablet (25 mg total) by mouth daily., Disp: 30 tablet, Rfl: 12 .  montelukast (SINGULAIR) 10 MG tablet, TAKE ONE TABLET EVERY DAY, Disp: 90  tablet, Rfl: 2 .  pantoprazole (PROTONIX) 40 MG tablet, Take 1 tablet (40 mg total) by mouth daily., Disp: 90 tablet, Rfl: 2 .  quinapril (ACCUPRIL) 20 MG tablet, Take 20 mg by mouth at bedtime., Disp: , Rfl:  .  zolpidem (AMBIEN CR) 12.5 MG CR tablet, Take 12.5 mg by mouth at bedtime as needed for sleep., Disp: , Rfl:  .  fluconazole (DIFLUCAN) 150 MG tablet, Take 1 tablet (150 mg total) by mouth every other day., Disp: 3 tablet, Rfl: 0 .  levofloxacin (LEVAQUIN) 500 MG tablet, Take 1 tablet (500 mg total) by mouth daily., Disp: 7 tablet, Rfl: 0 .  predniSONE (DELTASONE) 10 MG tablet, Take 1 tablet (10 mg total) by mouth daily with breakfast., Disp: 10 tablet, Rfl: 0  Allergies  Allergen Reactions  . Aspirin Hives  . Penicillins Hives  . Terramycin [Oxytetracycline] Hives     ROS  Constitutional: Negative for fever or weight change.  Respiratory:  Positive for cough and shortness of breath.   Cardiovascular: Negative for chest pain or palpitations.  Gastrointestinal: Negative for abdominal pain, she has a history of IBS and having diarrhea, explained risk of c.difi and the need to have stools checked if no improvement  Musculoskeletal: Positive  for gait problem no   joint swelling.  Skin:  Positive  for rash.  Neurological: Negative for dizziness or headache.  No other specific complaints in a complete review of systems (except as listed in HPI above).   Objective  Filed Vitals:   12/17/14 1500  BP: 142/76  Pulse: 68  Temp: 97 F (36.1 C)  TempSrc: Oral  Resp: 18  Height: '5\' 4"'  (1.626 m)  Weight: 248 lb 4.8 oz (112.628 kg)  SpO2: 97%    Body mass index is 42.6 kg/(m^2).  Physical Exam  Constitutional: Patient appears well-developed and well-nourished. Obese  No distress.  HEENT: head atraumatic, normocephalic, pupils equal and reactive to light, ears normal TM neck supple, throat within normal limits Cardiovascular: Normal rate, regular rhythm and normal heart sounds.  No murmur heard. No BLE edema.She has an ulcer on tip of left second toe, stasis dermatitis on both legs, some ulceration on right posterior calf.  Pulmonary/Chest: Effort normal and breath sounds normal. No respiratory distress. Abdominal: Soft.  There is no tenderness. Psychiatric: Patient has a normal mood and affect. behavior is normal. Judgment and thought content normal.   Recent Results (from the past 2160 hour(s))  POCT Urinalysis Dipstick     Status: Normal   Collection Time: 09/21/14  4:13 PM  Result Value Ref Range   Color, UA YELLOW    Clarity, UA CLEAR    Glucose, UA NEGATIVE    Bilirubin, UA NEGATIVE    Ketones, UA NEGATIVE    Spec Grav, UA 1.020    Blood, UA NEGATIVE    pH, UA 6.0    Protein, UA NEGATIVE    Urobilinogen, UA 0.2    Nitrite, UA NEGATIVE    Leukocytes, UA Negative Negative  CBC with Differential/Platelet     Status: None   Collection Time: 10/10/14  8:22 AM  Result Value Ref Range   WBC 5.9 3.4 - 10.8 x10E3/uL   RBC 4.36 3.77 - 5.28 x10E6/uL   Hemoglobin 12.5 11.1 - 15.9 g/dL   Hematocrit 36.3 34.0 - 46.6 %   MCV 83 79 - 97 fL   MCH 28.7 26.6 - 33.0 pg   MCHC 34.4 31.5 - 35.7 g/dL   RDW  14.8 12.3 - 15.4 %   Platelets 263 150  - 379 x10E3/uL   Neutrophils 64 %   Lymphs 20 %   Monocytes 9 %   Eos 6 %   Basos 1 %   Neutrophils Absolute 3.8 1.4 - 7.0 x10E3/uL   Lymphocytes Absolute 1.2 0.7 - 3.1 x10E3/uL   Monocytes Absolute 0.5 0.1 - 0.9 x10E3/uL   EOS (ABSOLUTE) 0.4 0.0 - 0.4 x10E3/uL   Basophils Absolute 0.1 0.0 - 0.2 x10E3/uL   Immature Granulocytes 0 %   Immature Grans (Abs) 0.0 0.0 - 0.1 x10E3/uL  Comprehensive metabolic panel     Status: Abnormal   Collection Time: 10/10/14  8:22 AM  Result Value Ref Range   Glucose 122 (H) 65 - 99 mg/dL   BUN 16 8 - 27 mg/dL   Creatinine, Ser 0.79 0.57 - 1.00 mg/dL   GFR calc non Af Amer 79 >59 mL/min/1.73   GFR calc Af Amer 91 >59 mL/min/1.73   BUN/Creatinine Ratio 20 11 - 26   Sodium 143 134 - 144 mmol/L   Potassium 4.9 3.5 - 5.2 mmol/L   Chloride 102 97 - 108 mmol/L   CO2 23 18 - 29 mmol/L   Calcium 9.3 8.7 - 10.3 mg/dL   Total Protein 6.5 6.0 - 8.5 g/dL   Albumin 4.2 3.6 - 4.8 g/dL   Globulin, Total 2.3 1.5 - 4.5 g/dL   Albumin/Globulin Ratio 1.8 1.1 - 2.5   Bilirubin Total 0.3 0.0 - 1.2 mg/dL   Alkaline Phosphatase 88 39 - 117 IU/L   AST 22 0 - 40 IU/L   ALT 17 0 - 32 IU/L  TSH     Status: None   Collection Time: 10/10/14  8:22 AM  Result Value Ref Range   TSH 1.750 0.450 - 4.500 uIU/mL  Lipid panel     Status: None   Collection Time: 10/10/14  8:22 AM  Result Value Ref Range   Cholesterol, Total 185 100 - 199 mg/dL   Triglycerides 89 0 - 149 mg/dL   HDL 94 >39 mg/dL    Comment: According to ATP-III Guidelines, HDL-C >59 mg/dL is considered a negative risk factor for CHD.    VLDL Cholesterol Cal 18 5 - 40 mg/dL   LDL Calculated 73 0 - 99 mg/dL   Chol/HDL Ratio 2.0 0.0 - 4.4 ratio units    Comment:                                   T. Chol/HDL Ratio                                             Men  Women                               1/2 Avg.Risk  3.4    3.3                                   Avg.Risk  5.0    4.4  2X Avg.Risk  9.6    7.1                                3X Avg.Risk 23.4   11.0   T3, free     Status: None   Collection Time: 10/10/14  8:22 AM  Result Value Ref Range   T3, Free 2.1 2.0 - 4.4 pg/mL  T4, free     Status: None   Collection Time: 10/10/14  8:22 AM  Result Value Ref Range   Free T4 1.33 0.82 - 1.77 ng/dL  HgB A1c     Status: Abnormal   Collection Time: 10/10/14  8:22 AM  Result Value Ref Range   Hgb A1c MFr Bld 6.6 (H) 4.8 - 5.6 %    Comment:          Pre-diabetes: 5.7 - 6.4          Diabetes: >6.4          Glycemic control for adults with diabetes: <7.0    Est. average glucose Bld gHb Est-mCnc 143 mg/dL  Urinalysis, Complete     Status: None   Collection Time: 11/09/14 11:00 AM  Result Value Ref Range   Specific Gravity, UA 1.025 1.005 - 1.030   pH, UA 6.0 5.0 - 7.5   Color, UA Yellow Yellow   Appearance Ur Clear Clear   Leukocytes, UA Negative Negative   Protein, UA Negative Negative/Trace   Glucose, UA Negative Negative   Ketones, UA Negative Negative   RBC, UA Negative Negative   Bilirubin, UA Negative Negative   Urobilinogen, Ur 0.2 0.2 - 1.0 mg/dL   Nitrite, UA Negative Negative   Microscopic Examination See below:   Microscopic Examination     Status: None   Collection Time: 11/09/14 11:00 AM  Result Value Ref Range   WBC, UA None seen 0 -  5 /hpf   RBC, UA None seen 0 -  2 /hpf   Epithelial Cells (non renal) 0-10 0 - 10 /hpf   Bacteria, UA None seen None seen/Few  BLADDER SCAN AMB NON-IMAGING     Status: None   Collection Time: 11/09/14 11:28 AM  Result Value Ref Range   Scan Result 152m   BUN     Status: None   Collection Time: 12/14/14 11:32 AM  Result Value Ref Range   BUN 18 6 - 20 mg/dL  Creatinine, serum     Status: None   Collection Time: 12/14/14 11:32 AM  Result Value Ref Range   Creatinine, Ser 0.71 0.44 - 1.00 mg/dL   GFR calc non Af Amer >60 >60 mL/min   GFR calc Af Amer >60 >60 mL/min    Comment: (NOTE) The eGFR has been  calculated using the CKD EPI equation. This calculation has not been validated in all clinical situations. eGFR's persistently <60 mL/min signify possible Chronic Kidney Disease.      PHQ2/9: [facility] 11/28/2014 09/21/2014  Decreased Interest 0 1  Down, Depressed, Hopeless 0 1  PHQ - 2 Score 0 2  Altered sleeping - 0  Tired, decreased energy - 0  Change in appetite - 0  Feeling bad or failure about yourself  - 1  Trouble concentrating - 2  Moving slowly or fidgety/restless - 0  Suicidal thoughts - 0  PHQ-9 Score - 5  Difficult doing work/chores - Somewhat difficult     Fall Risk:  Fall Risk  11/28/2014 09/21/2014  Falls in the past year? No Yes  Number falls in past yr: - 1  Injury with Fall? - No      Assessment & Plan  1. Productive cough  Possible CAP, versus Bronchitis, we will check CXR, discussed the risk of hyperglycemia with prednisone - predniSONE (DELTASONE) 10 MG tablet; Take 1 tablet (10 mg total) by mouth daily with breakfast.  Dispense: 10 tablet; Refill: 0 - levofloxacin (LEVAQUIN) 500 MG tablet; Take 1 tablet (500 mg total) by mouth daily.  Dispense: 7 tablet; Refill: 0 -CXR  2. Atherosclerotic PVD with ulceration   keep follow up with vascular surgeon - Ambulatory referral to Podiatry - levofloxacin (LEVAQUIN) 500 MG tablet; Take 1 tablet (500 mg total) by mouth daily.  Dispense: 7 tablet; Refill: 0  3. DM (diabetes mellitus) type II controlled peripheral vascular disorder   drink water, eat healthy  4. Major depression, recurrent, chronic  Needs to resume counseling , but can't afford it  5. Yeast vaginitis   from recent antibiotic use - fluconazole (DIFLUCAN) 150 MG tablet; Take 1 tablet (150 mg total) by mouth every other day.  Dispense: 3 tablet; Refill: 0  6. Need for pneumococcal vaccination  - Pneumococcal polysaccharide vaccine 23-valent greater than or equal to 2yo subcutaneous/IM

## 2014-12-18 ENCOUNTER — Telehealth: Payer: Self-pay

## 2014-12-18 NOTE — Telephone Encounter (Signed)
Patient phone line was busy on house phone and cell phone patient was not available.

## 2014-12-18 NOTE — Progress Notes (Signed)
Patient notified

## 2014-12-18 NOTE — Telephone Encounter (Signed)
-----   Message from Steele Sizer, MD sent at 12/18/2014  3:53 PM EDT ----- Normal CXR

## 2014-12-21 ENCOUNTER — Encounter: Payer: Self-pay | Admitting: Family Medicine

## 2014-12-21 ENCOUNTER — Ambulatory Visit (INDEPENDENT_AMBULATORY_CARE_PROVIDER_SITE_OTHER): Payer: PPO | Admitting: Family Medicine

## 2014-12-21 VITALS — BP 140/70 | HR 74 | Temp 97.7°F | Resp 16 | Ht 64.0 in | Wt 246.6 lb

## 2014-12-21 DIAGNOSIS — R058 Other specified cough: Secondary | ICD-10-CM

## 2014-12-21 DIAGNOSIS — R05 Cough: Secondary | ICD-10-CM

## 2014-12-21 DIAGNOSIS — L97521 Non-pressure chronic ulcer of other part of left foot limited to breakdown of skin: Secondary | ICD-10-CM | POA: Diagnosis not present

## 2014-12-21 DIAGNOSIS — L97529 Non-pressure chronic ulcer of other part of left foot with unspecified severity: Secondary | ICD-10-CM | POA: Insufficient documentation

## 2014-12-21 DIAGNOSIS — I999 Unspecified disorder of circulatory system: Secondary | ICD-10-CM

## 2014-12-21 NOTE — Progress Notes (Signed)
Name: [name]   MRN: [id number]    DOB: [birth year]   Date:12/21/2014       Progress Note  Subjective  Chief Complaint  Chief Complaint  Patient presents with  . Follow-up    patient is here to follow-up. she stated that she is getting better, but she was only able to take 2 days of the prednisone due to it making her very sick.     HPI   [name] is a pleasant 64 year old female who is here today with follow up of recurrent cellulitis of her lower extremities, leg and foot ulcers, mood changes, recent URI. She saw my colleague yesterday and was placed on Levaquin plus prednisone but she was not able to tolerate the prednisone. CXR did not show infiltrates or fluid.  Not associated with fevers, chills, nausea, vomiting. She has a current relevant medical diagnosis of DM II reasonably controlled for age with peripheral neuropathy and circulatory dysfunction, CKD stage II, Hypertension, venous statis dermatitis, history of LE ulcers with reoccurance, Hypothyroidism. She is checking her feet daily and sees a podiatry specialist as well at [facility] but she has not returned as she is considering waiting to see a new provider coming in October or go back to Dr. Lucky Cowboy [facility]. She notes dryness to her feet, abrasions, early stages on ulcers, crooked toes and flat feet and reduced sensation in her feet.   Past Medical History  Diagnosis Date  . Sinus problem   . Swelling   . Thyroid disease   . Diabetes   . Venous stasis dermatitis of both lower extremities   . Hx of blood clots   . HBP (high blood pressure)   . Kidney disease   . Depression   . Dysthymic disorder   . Paroxysmal atrial fibrillation 2007    In June 2015, Cardiac Event Monitor: Mostly SR/sinus arrhythmia with PVCs that are frequent. Short bursts of A. fib lasting several minutes  . Asthma   . GERD (gastroesophageal reflux disease)   . Heart murmur     Echocardiogram June 2015: Mild MR (possible  vegetation seen on TTE, not seen on TEE), normal LV size with moderate concentric LVH. Normal function EF 60-65%. Normal diastolic function. Mild LA dilation.  . DVT (deep venous thrombosis)   . Insomnia   . Atrial fibrillation, controlled   . Cystocele   . Neuropathy involving both lower extremities   . Anxiety and depression   . Obesity, Class II, BMI 35-39.9, with comorbidity   . Cholecystolithiasis   . Vaginitis   . Kidney stone   . Adrenal mass     Patient Active Problem List   Diagnosis Date Noted  . Atherosclerotic PVD with ulceration 12/17/2014  . Productive cough 12/17/2014  . Edema of right lower extremity 12/05/2014  . Need for influenza vaccination 11/28/2014  . Mixed incontinence 11/26/2014  . Atrophic vaginitis 11/26/2014  . Myelolipoma of right adrenal gland 11/26/2014  . Comprehensive diabetic foot examination, type 2 DM, encounter for 11/01/2014  . Marital problems 10/25/2014  . Major depression, recurrent, chronic 10/24/2014  . Anxiety and depression 09/21/2014  . Chronic kidney disease 09/21/2014  . Allergic contact dermatitis 09/21/2014  . Bladder cystocele 09/21/2014  . Gastro-esophageal reflux disease without esophagitis 09/21/2014  . History of cellulitis 09/21/2014  . H/O deep venous thrombosis 09/21/2014  . Personal history of surgery to heart and great vessels, presenting hazards to health 09/21/2014  . H/O  fall 09/21/2014  . Cannot sleep 09/21/2014  . Neuropathy involving both lower extremities 09/21/2014  . Extreme obesity 09/21/2014  . Hernia, rectovaginal 09/21/2014  . Vascular disorder of lower extremity 09/21/2014  . Candidal dermatitis 09/21/2014  . Dryness of vagina 09/21/2014  . Ulcer of lower extremity 09/21/2014  . DM (diabetes mellitus) type II controlled peripheral vascular disorder 08/31/2014  . Diabetic peripheral neuropathy associated with type 2 diabetes mellitus 08/31/2014  . Asthma, moderate persistent, well-controlled  08/31/2014  . Hypothyroidism, adult 08/31/2014  . Peripheral vascular disease due to secondary diabetes mellitus 12/11/2013  . Apnea, sleep 10/19/2013  . Venous stasis dermatitis of both lower extremities 10/06/2013  . Paroxysmal atrial fibrillation 07/27/2013  . Hypertension goal BP (blood pressure) < 150/90 07/27/2013  . Hyperhidrosis - unclear etiology 07/27/2013    Social History  Substance Use Topics  . Smoking status: Never Smoker   . Smokeless tobacco: Never Used  . Alcohol Use: No     Current outpatient prescriptions:  .  ALPRAZolam (XANAX) 0.5 MG tablet, TAKE ONE TABLET TWICE DAILY AS NEEDED, Disp: 60 tablet, Rfl: 5 .  buPROPion (WELLBUTRIN XL) 300 MG 24 hr tablet, TAKE ONE TABLET BY MOUTH EVERY DAY, Disp: 30 tablet, Rfl: 5 .  dabigatran (PRADAXA) 150 MG CAPS capsule, Take 150 mg by mouth 2 (two) times daily., Disp: , Rfl:  .  DULoxetine (CYMBALTA) 60 MG capsule, TAKE 1 CAPSULE EVERY DAY, Disp: 30 capsule, Rfl: 5 .  estradiol (ESTRACE VAGINAL) 0.1 MG/GM vaginal cream, Place 1 application vaginally 2 (two) times a week., Disp: , Rfl:  .  fluconazole (DIFLUCAN) 150 MG tablet, Take 1 tablet (150 mg total) by mouth every other day., Disp: 3 tablet, Rfl: 0 .  gabapentin (NEURONTIN) 600 MG tablet, , Disp: , Rfl:  .  HYDROcodone-acetaminophen (NORCO/VICODIN) 5-325 MG per tablet, , Disp: , Rfl:  .  levofloxacin (LEVAQUIN) 500 MG tablet, Take 1 tablet (500 mg total) by mouth daily., Disp: 7 tablet, Rfl: 0 .  levothyroxine (SYNTHROID, LEVOTHROID) 150 MCG tablet, Take 150 mcg by mouth daily before breakfast., Disp: , Rfl:  .  Lorcaserin HCl (BELVIQ) 10 MG TABS, Take 1 tablet by mouth 2 (two) times daily., Disp: , Rfl:  .  meloxicam (MOBIC) 7.5 MG tablet, Take 7.5 mg by mouth daily., Disp: , Rfl:  .  metFORMIN (GLUCOPHAGE) 1000 MG tablet, Take 1 tablet by mouth 2 (two) times daily., Disp: , Rfl:  .  mirabegron ER (MYRBETRIQ) 25 MG TB24 tablet, Take 1 tablet (25 mg total) by mouth daily.,  Disp: 30 tablet, Rfl: 12 .  montelukast (SINGULAIR) 10 MG tablet, TAKE ONE TABLET EVERY DAY, Disp: 90 tablet, Rfl: 2 .  pantoprazole (PROTONIX) 40 MG tablet, Take 1 tablet (40 mg total) by mouth daily., Disp: 90 tablet, Rfl: 2 .  predniSONE (DELTASONE) 10 MG tablet, Take 1 tablet (10 mg total) by mouth daily with breakfast., Disp: 10 tablet, Rfl: 0 .  quinapril (ACCUPRIL) 20 MG tablet, Take 20 mg by mouth at bedtime., Disp: , Rfl:  .  zolpidem (AMBIEN CR) 12.5 MG CR tablet, Take 12.5 mg by mouth at bedtime as needed for sleep., Disp: , Rfl:   Allergies  Allergen Reactions  . Aspirin Hives  . Penicillins Hives  . Terramycin [Oxytetracycline] Hives    Review of Systems  Positive for toe sore, improving cough, chest congestion and nasal congestion as mentioned in HPI, otherwise all systems reviewed and are negative.  Objective  BP 140/70 mmHg  Pulse 74  Temp(Src) 97.7 F (36.5 C) (Oral)  Resp 16  Ht 5\' 4"  (1.626 m)  Wt 246 lb 9.6 oz (111.857 kg)  BMI 42.31 kg/m2  SpO2 97%  Body mass index is 42.31 kg/(m^2).   Physical Exam  Constitutional: Patient obese and well-nourished. In no distress.  Cardiovascular: Normal rate, regular rhythm and normal heart sounds. No murmur heard.  Pulmonary/Chest: Effort normal and breath sounds normal. No respiratory distress. Peripheral vascular: Bilateral LE no edema. Skin: Right lower extremity with RESOLVED tenderness, warmth, erythema, RESOLVED well demarcated border of area of erythema at upper shins not involving the knee or foot. Left foot with 3rd digit tip abrasion non infected, limited to breakdown of skin with good granulation tissue formation.  Psychiatric: Patient has a normal mood and affect. Behavior is normal in office today. Judgment and thought content normal in office today.    Assessment & Plan  1. Vascular disorder of lower extremity She is agreeable to see if we can consult with vascular specialist again near future  (she did not want referral today as she is busy with upcoming weddings in October).  2. Productive cough Finish antibiotic, improving.   3. Ulcer of toe of left foot, limited to breakdown of skin Stable, discussed appropriate foot wear.

## 2014-12-24 ENCOUNTER — Telehealth: Payer: Self-pay | Admitting: *Deleted

## 2014-12-24 ENCOUNTER — Ambulatory Visit: Payer: Self-pay | Admitting: Urology

## 2014-12-24 ENCOUNTER — Encounter: Payer: Self-pay | Admitting: Urology

## 2014-12-24 NOTE — Telephone Encounter (Signed)
-----   Message from Nori Riis, PA-C sent at 12/24/2014 11:23 AM EDT ----- Please let the patient know that her adrenal myelolipomas are stable and no further follow up is noted.

## 2014-12-24 NOTE — Telephone Encounter (Signed)
Spoke with patient and gave results. Patient ok with outcome.

## 2015-01-02 ENCOUNTER — Other Ambulatory Visit: Payer: Self-pay | Admitting: Family Medicine

## 2015-01-02 ENCOUNTER — Telehealth: Payer: Self-pay | Admitting: Family Medicine

## 2015-01-02 DIAGNOSIS — B3731 Acute candidiasis of vulva and vagina: Secondary | ICD-10-CM

## 2015-01-02 DIAGNOSIS — B373 Candidiasis of vulva and vagina: Secondary | ICD-10-CM

## 2015-01-02 MED ORDER — FLUCONAZOLE 150 MG PO TABS: 150.0000 mg | ORAL_TABLET | Freq: Once | ORAL | Status: DC

## 2015-01-02 NOTE — Telephone Encounter (Signed)
Refill request was sent to Dr. Ashany Sundaram for approval and submission.  

## 2015-01-02 NOTE — Telephone Encounter (Signed)
Sent Diflucan to [facility]

## 2015-01-02 NOTE — Telephone Encounter (Signed)
PT JUST FINISHED UP AN ANTIOBITIC AND NOW HAS A YEAST INFECTION AND SHE IS ASKING FOR SOMETHING TO GET RID OF IT. SHE IS USING SOMETHING OVER THE COUNTER AND IT IS NOT WORKING. [place].

## 2015-01-03 NOTE — Telephone Encounter (Signed)
LFT MESS ABOUT RX °

## 2015-01-07 ENCOUNTER — Other Ambulatory Visit: Payer: Self-pay | Admitting: Family Medicine

## 2015-01-07 DIAGNOSIS — M7540 Impingement syndrome of unspecified shoulder: Secondary | ICD-10-CM | POA: Insufficient documentation

## 2015-01-07 DIAGNOSIS — M79673 Pain in unspecified foot: Secondary | ICD-10-CM | POA: Insufficient documentation

## 2015-01-16 ENCOUNTER — Ambulatory Visit: Payer: Self-pay | Admitting: Cardiology

## 2015-01-16 ENCOUNTER — Ambulatory Visit: Payer: PPO | Attending: Pain Medicine | Admitting: Pain Medicine

## 2015-01-16 ENCOUNTER — Encounter: Payer: Self-pay | Admitting: Pain Medicine

## 2015-01-16 ENCOUNTER — Other Ambulatory Visit: Payer: Self-pay | Admitting: Pain Medicine

## 2015-01-16 VITALS — BP 174/75 | HR 62 | Temp 98.2°F | Resp 20 | Ht 65.0 in | Wt 242.0 lb

## 2015-01-16 DIAGNOSIS — M25511 Pain in right shoulder: Secondary | ICD-10-CM | POA: Diagnosis not present

## 2015-01-16 DIAGNOSIS — K219 Gastro-esophageal reflux disease without esophagitis: Secondary | ICD-10-CM | POA: Diagnosis not present

## 2015-01-16 DIAGNOSIS — M79672 Pain in left foot: Secondary | ICD-10-CM | POA: Insufficient documentation

## 2015-01-16 DIAGNOSIS — Z79899 Other long term (current) drug therapy: Secondary | ICD-10-CM

## 2015-01-16 DIAGNOSIS — F1190 Opioid use, unspecified, uncomplicated: Secondary | ICD-10-CM | POA: Diagnosis not present

## 2015-01-16 DIAGNOSIS — M25519 Pain in unspecified shoulder: Secondary | ICD-10-CM | POA: Diagnosis present

## 2015-01-16 DIAGNOSIS — I480 Paroxysmal atrial fibrillation: Secondary | ICD-10-CM | POA: Diagnosis not present

## 2015-01-16 DIAGNOSIS — F418 Other specified anxiety disorders: Secondary | ICD-10-CM | POA: Diagnosis not present

## 2015-01-16 DIAGNOSIS — G5710 Meralgia paresthetica, unspecified lower limb: Secondary | ICD-10-CM | POA: Insufficient documentation

## 2015-01-16 DIAGNOSIS — E669 Obesity, unspecified: Secondary | ICD-10-CM | POA: Diagnosis not present

## 2015-01-16 DIAGNOSIS — E039 Hypothyroidism, unspecified: Secondary | ICD-10-CM | POA: Diagnosis not present

## 2015-01-16 DIAGNOSIS — Z5181 Encounter for therapeutic drug level monitoring: Secondary | ICD-10-CM

## 2015-01-16 DIAGNOSIS — Z79891 Long term (current) use of opiate analgesic: Secondary | ICD-10-CM | POA: Diagnosis not present

## 2015-01-16 DIAGNOSIS — M79671 Pain in right foot: Secondary | ICD-10-CM | POA: Diagnosis not present

## 2015-01-16 DIAGNOSIS — I129 Hypertensive chronic kidney disease with stage 1 through stage 4 chronic kidney disease, or unspecified chronic kidney disease: Secondary | ICD-10-CM | POA: Diagnosis not present

## 2015-01-16 DIAGNOSIS — G8929 Other chronic pain: Secondary | ICD-10-CM | POA: Insufficient documentation

## 2015-01-16 DIAGNOSIS — G5711 Meralgia paresthetica, right lower limb: Secondary | ICD-10-CM

## 2015-01-16 DIAGNOSIS — E1140 Type 2 diabetes mellitus with diabetic neuropathy, unspecified: Secondary | ICD-10-CM | POA: Diagnosis not present

## 2015-01-16 DIAGNOSIS — E1142 Type 2 diabetes mellitus with diabetic polyneuropathy: Secondary | ICD-10-CM

## 2015-01-16 MED ORDER — HYDROCODONE-ACETAMINOPHEN 5-325 MG PO TABS: 1.0000 | ORAL_TABLET | Freq: Three times a day (TID) | ORAL | Status: DC | PRN

## 2015-01-16 MED ORDER — MELATONIN 10 MG PO CAPS: 20.0000 mg | ORAL_CAPSULE | Freq: Every evening | ORAL | Status: DC | PRN

## 2015-01-16 MED ORDER — DULOXETINE HCL 60 MG PO CPEP: 60.0000 mg | ORAL_CAPSULE | Freq: Every day | ORAL | Status: DC

## 2015-01-16 MED ORDER — GABAPENTIN 800 MG PO TABS: 800.0000 mg | ORAL_TABLET | Freq: Four times a day (QID) | ORAL | Status: DC

## 2015-01-16 MED ORDER — ZOLPIDEM TARTRATE ER 12.5 MG PO TBCR: 12.5000 mg | EXTENDED_RELEASE_TABLET | Freq: Every evening | ORAL | Status: DC | PRN

## 2015-01-16 NOTE — Patient Instructions (Signed)
GENERAL RISKS AND COMPLICATIONS  What are the risk, side effects and possible complications? Generally speaking, most procedures are safe.  However, with any procedure there are risks, side effects, and the possibility of complications.  The risks and complications are dependent upon the sites that are lesioned, or the type of nerve block to be performed.  The closer the procedure is to the spine, the more serious the risks are.  Great care is taken when placing the radio frequency needles, block needles or lesioning probes, but sometimes complications can occur. 1. Infection: Any time there is an injection through the skin, there is a risk of infection.  This is why sterile conditions are used for these blocks.  There are four possible types of infection. 1. Localized skin infection. 2. Central Nervous System Infection-This can be in the form of Meningitis, which can be deadly. 3. Epidural Infections-This can be in the form of an epidural abscess, which can cause pressure inside of the spine, causing compression of the spinal cord with subsequent paralysis. This would require an emergency surgery to decompress, and there are no guarantees that the patient would recover from the paralysis. 4. Discitis-This is an infection of the intervertebral discs.  It occurs in about 1% of discography procedures.  It is difficult to treat and it may lead to surgery.        2. Pain: the needles have to go through skin and soft tissues, will cause soreness.       3. Damage to internal structures:  The nerves to be lesioned may be near blood vessels or    other nerves which can be potentially damaged.       4. Bleeding: Bleeding is more common if the patient is taking blood thinners such as  aspirin, Coumadin, Ticiid, Plavix, etc., or if he/she have some genetic predisposition  such as hemophilia. Bleeding into the spinal canal can cause compression of the spinal  cord with subsequent paralysis.  This would require an  emergency surgery to  decompress and there are no guarantees that the patient would recover from the  paralysis.       5. Pneumothorax:  Puncturing of a lung is a possibility, every time a needle is introduced in  the area of the chest or upper back.  Pneumothorax refers to free air around the  collapsed lung(s), inside of the thoracic cavity (chest cavity).  Another two possible  complications related to a similar event would include: Hemothorax and Chylothorax.   These are variations of the Pneumothorax, where instead of air around the collapsed  lung(s), you may have blood or chyle, respectively.       6. Spinal headaches: They may occur with any procedures in the area of the spine.       7. Persistent CSF (Cerebro-Spinal Fluid) leakage: This is a rare problem, but may occur  with prolonged intrathecal or epidural catheters either due to the formation of a fistulous  track or a dural tear.       8. Nerve damage: By working so close to the spinal cord, there is always a possibility of  nerve damage, which could be as serious as a permanent spinal cord injury with  paralysis.       9. Death:  Although rare, severe deadly allergic reactions known as "Anaphylactic  reaction" can occur to any of the medications used.      10. Worsening of the symptoms:  We can always make thing worse.    What are the chances of something like this happening? Chances of any of this occuring are extremely low.  By statistics, you have more of a chance of getting killed in a motor vehicle accident: while driving to the hospital than any of the above occurring .  Nevertheless, you should be aware that they are possibilities.  In general, it is similar to taking a shower.  Everybody knows that you can slip, hit your head and get killed.  Does that mean that you should not shower again?  Nevertheless always keep in mind that statistics do not mean anything if you happen to be on the wrong side of them.  Even if a procedure has a 1  (one) in a 1,000,000 (million) chance of going wrong, it you happen to be that one..Also, keep in mind that by statistics, you have more of a chance of having something go wrong when taking medications.  Who should not have this procedure? If you are on a blood thinning medication (e.g. Coumadin, Plavix, see list of "Blood Thinners"), or if you have an active infection going on, you should not have the procedure.  If you are taking any blood thinners, please inform your physician.  How should I prepare for this procedure?  Do not eat or drink anything at least six hours prior to the procedure.  Bring a driver with you .  It cannot be a taxi.  Come accompanied by an adult that can drive you back, and that is strong enough to help you if your legs get weak or numb from the local anesthetic.  Take all of your medicines the morning of the procedure with just enough water to swallow them.  If you have diabetes, make sure that you are scheduled to have your procedure done first thing in the morning, whenever possible.  If you have diabetes, take only half of your insulin dose and notify our nurse that you have done so as soon as you arrive at the clinic.  If you are diabetic, but only take blood sugar pills (oral hypoglycemic), then do not take them on the morning of your procedure.  You may take them after you have had the procedure.  Do not take aspirin or any aspirin-containing medications, at least eleven (11) days prior to the procedure.  They may prolong bleeding.  Wear loose fitting clothing that may be easy to take off and that you would not mind if it got stained with Betadine or blood.  Do not wear any jewelry or perfume  Remove any nail coloring.  It will interfere with some of our monitoring equipment.  NOTE: Remember that this is not meant to be interpreted as a complete list of all possible complications.  Unforeseen problems may occur.  BLOOD THINNERS The following drugs  contain aspirin or other products, which can cause increased bleeding during surgery and should not be taken for 2 weeks prior to and 1 week after surgery.  If you should need take something for relief of minor pain, you may take acetaminophen which is found in Tylenol,m Datril, Anacin-3 and Panadol. It is not blood thinner. The products listed below are.  Do not take any of the products listed below in addition to any listed on your instruction sheet.  A.P.C or A.P.C with Codeine Codeine Phosphate Capsules #3 Ibuprofen Ridaura  ABC compound Congesprin Imuran rimadil  Advil Cope Indocin Robaxisal  Alka-Seltzer Effervescent Pain Reliever and Antacid Coricidin or Coricidin-D  Indomethacin Rufen    Alka-Seltzer plus Cold Medicine Cosprin Ketoprofen S-A-C Tablets  Anacin Analgesic Tablets or Capsules Coumadin Korlgesic Salflex  Anacin Extra Strength Analgesic tablets or capsules CP-2 Tablets Lanoril Salicylate  Anaprox Cuprimine Capsules Levenox Salocol  Anexsia-D Dalteparin Magan Salsalate  Anodynos Darvon compound Magnesium Salicylate Sine-off  Ansaid Dasin Capsules Magsal Sodium Salicylate  Anturane Depen Capsules Marnal Soma  APF Arthritis pain formula Dewitt's Pills Measurin Stanback  Argesic Dia-Gesic Meclofenamic Sulfinpyrazone  Arthritis Bayer Timed Release Aspirin Diclofenac Meclomen Sulindac  Arthritis pain formula Anacin Dicumarol Medipren Supac  Analgesic (Safety coated) Arthralgen Diffunasal Mefanamic Suprofen  Arthritis Strength Bufferin Dihydrocodeine Mepro Compound Suprol  Arthropan liquid Dopirydamole Methcarbomol with Aspirin Synalgos  ASA tablets/Enseals Disalcid Micrainin Tagament  Ascriptin Doan's Midol Talwin  Ascriptin A/D Dolene Mobidin Tanderil  Ascriptin Extra Strength Dolobid Moblgesic Ticlid  Ascriptin with Codeine Doloprin or Doloprin with Codeine Momentum Tolectin  Asperbuf Duoprin Mono-gesic Trendar  Aspergum Duradyne Motrin or Motrin IB Triminicin  Aspirin  plain, buffered or enteric coated Durasal Myochrisine Trigesic  Aspirin Suppositories Easprin Nalfon Trillsate  Aspirin with Codeine Ecotrin Regular or Extra Strength Naprosyn Uracel  Atromid-S Efficin Naproxen Ursinus  Auranofin Capsules Elmiron Neocylate Vanquish  Axotal Emagrin Norgesic Verin  Azathioprine Empirin or Empirin with Codeine Normiflo Vitamin E  Azolid Emprazil Nuprin Voltaren  Bayer Aspirin plain, buffered or children's or timed BC Tablets or powders Encaprin Orgaran Warfarin Sodium  Buff-a-Comp Enoxaparin Orudis Zorpin  Buff-a-Comp with Codeine Equegesic Os-Cal-Gesic   Buffaprin Excedrin plain, buffered or Extra Strength Oxalid   Bufferin Arthritis Strength Feldene Oxphenbutazone   Bufferin plain or Extra Strength Feldene Capsules Oxycodone with Aspirin   Bufferin with Codeine Fenoprofen Fenoprofen Pabalate or Pabalate-SF   Buffets II Flogesic Panagesic   Buffinol plain or Extra Strength Florinal or Florinal with Codeine Panwarfarin   Buf-Tabs Flurbiprofen Penicillamine   Butalbital Compound Four-way cold tablets Penicillin   Butazolidin Fragmin Pepto-Bismol   Carbenicillin Geminisyn Percodan   Carna Arthritis Reliever Geopen Persantine   Carprofen Gold's salt Persistin   Chloramphenicol Goody's Phenylbutazone   Chloromycetin Haltrain Piroxlcam   Clmetidine heparin Plaquenil   Cllnoril Hyco-pap Ponstel   Clofibrate Hydroxy chloroquine Propoxyphen         Before stopping any of these medications, be sure to consult the physician who ordered them.  Some, such as Coumadin (Warfarin) are ordered to prevent or treat serious conditions such as "deep thrombosis", "pumonary embolisms", and other heart problems.  The amount of time that you may need off of the medication may also vary with the medication and the reason for which you were taking it.  If you are taking any of these medications, please make sure you notify your pain physician before you undergo any  procedures.         Lateral Femoral Cutaneous Nerve Block Patient Information  Description: The lateral femoral cutaneous nerve of the thigh is a purely sensory nerve that can become entrapped or irritated for a number of reasons.  The pain associated with this condition is called meralgia paraesthetica.  Patients affected with this syndrome have burning pain or abnormal sensation along the lateral aspect of the thigh.  The pain can be worsened by prolonged walking, standing, or constrictive garments around the house.   The diagnosis can be confirmed and treatment initiated by blocking the nerve with local anesthetic (like Novocaine).  At times, a steroid solution may be injected at the same time.  The site of injection is through a tiny needle in the left,  lower quadrant of the abdomen.   The entire block usually lasts less than 5 minutes.  Conditions which may be treated by lateral femoral cutaneous nerve block:   Meralagia paraesthetica  Preparation for the injection:  1. Do not eat any solid food or dairy products within 6 hours of your appointment.  2. You may drink clear liquids up to 2 hours before appointment.  Clear liquids include water, black coffee, juice or soda. No milk or cream please. 3. You may take your regular medication, including pain medications, with a sip of water before your appointment.  Diabetics should hold regular insulin (if taken separately) and take 1/2 normal NPH dose the morning of the procedure.  Carry some sugar containing items with you to your appointment. 4. A driver must accompany you and be prepared to drive you home after your procedure. 5. Bring all you current medications with you 6. An IV may be inserted and sedation may be given at the discretion of the physician. 7. A blood pressure cuff, EKG and other monitors will often be applied during the procedure.  Some patients may need to have extra oxygen administered for a short period. 8. You  will be asked to provide medical information, including your allergies and medications, prior to the procedure.  We must know immediately if you are taking blood thinners (like Coumadin/Warfarin) or if you allergic to IV iodine contrast (dye)  We must know if you could possible be pregnant.   Possible side-effects:   Bleeding from needle site  Infection (rate, may require surgery)  Nerve injury (rare)  Numbness and Tingling (temporary)  Light-headedness (temporary)  Pain at injection site (several day)  Decreased blood pressure (rare, temporary)  Weakness in leg (temporary)  Call if you experience:  Hives or difficulty breathing (go to the emergency room)  Inflammation or drainage at the injection site(s)  Please note:  Although the local anesthetic injected can often make your leg feel good for several hours after the injection,  The pain may return.  It takes 3-7 days for steroids to work.  You may not notice any pain relief for at least one week.  If effective, we will often do a series of injections spaced 3-6 weeks apart to maximally decrease your pain.  If you have any questions, please cll [phone] [facility]

## 2015-01-16 NOTE — Progress Notes (Signed)
Safety precautions to be maintained throughout the outpatient stay will include: orient to surroundings, keep bed in low position, maintain call bell within reach at all times, provide assistance with transfer out of bed and ambulation.  

## 2015-01-16 NOTE — Progress Notes (Signed)
Did not bring pills for pill count

## 2015-01-19 NOTE — Progress Notes (Signed)
Patient's Name: [name] MRN: [id number] DOB: [birth year] DOS: 01/16/2015  Primary Reason(s) for Visit: Encounter for Medication Management. CC: Shoulder Pain   HPI:   [name] is a 64 y.o. year old, female patient, who returns today as an established patient. She has Paroxysmal atrial fibrillation ([place]); Hypertension goal BP (blood pressure) < 150/90; Hyperhidrosis - unclear etiology; Venous stasis dermatitis of both lower extremities; Peripheral vascular disease due to secondary diabetes mellitus ([place]); Apnea, sleep; DM (diabetes mellitus) type II controlled peripheral vascular disorder ([place]); Diabetic peripheral neuropathy associated with type 2 diabetes mellitus ([place]); Asthma, moderate persistent, well-controlled; Hypothyroidism, adult; Anxiety and depression; Chronic kidney disease; Allergic contact dermatitis; Bladder cystocele; Gastro-esophageal reflux disease without esophagitis; History of cellulitis; H/O deep venous thrombosis; Personal history of surgery to heart and great vessels, presenting hazards to health; H/O fall; Cannot sleep; Neuropathy involving both lower extremities ([place]); Extreme obesity ([place]); Hernia, rectovaginal; Vascular disorder of lower extremity; Candidal dermatitis; Dryness of vagina; Ulcer of lower extremity ([place]); Major depression, recurrent, chronic ([place]); Marital problems; Comprehensive diabetic foot examination, type 2 DM, encounter for [facility]); Mixed incontinence; Atrophic vaginitis; Myelolipoma of right adrenal gland; Edema of right lower extremity; Atherosclerotic PVD with ulceration ([place]); Productive cough; Ulcer of toe of left foot ([place]); Chronic pain; Long term current use of opiate analgesic; Long term prescription opiate use; Opiate use; Encounter for therapeutic drug level monitoring; Lateral femoral cutaneous entrapment syndrome; Bilateral foot pain; Chronic right shoulder pain; and Diabetic peripheral neuropathy ([place]) on her problem list.. Her primarily  concern today is the Shoulder Pain   Today's Pain Score: 9  Pain Type: Chronic pain Pain Location: Shoulder (right shoulder worse than left.) Pain Orientation: Right Pain Descriptors / Indicators: Sharp, Shooting, Stabbing, Aching, Numbness, Tightness Pain Frequency: Constant     Date of Last Visit: Date of Last Visit: 09/26/14 Service Provided on Last Visit: Service Provided on Last Visit: Med Refill  Pharmacotherapy Review: Side-effects or Adverse reactions: None reported. Effectiveness: Described as relatively effective, allowing for increase in activities of daily living (ADL). Onset of action: Within expected pharmacological parameters. Duration of action: Within normal limits for medication. Peak effect: Timing and results are as within normal expected parameters. [place] PMP: Compliant with practice rules and regulations. DST: Compliant with practice rules and regulations. Lab work: No new labs ordered by our practice. Treatment compliance: Compliant. Substance Use Disorder (SUD) Risk Level: Low Planned course of action: Continue therapy as is.  Allergies: [name] is allergic to aspirin; penicillins; and terramycin.  Meds: The patient has a current medication list which includes the following prescription(s): alprazolam, bupropion, duloxetine, estradiol, fluconazole, gabapentin, hydrocodone-acetaminophen, levothyroxine, meloxicam, metformin, metformin, mirabegron er, montelukast, pantoprazole, pradaxa, quinapril, zolpidem, gabapentin, hydrocodone-acetaminophen, hydrocodone-acetaminophen, and melatonin. Requested Prescriptions   Signed Prescriptions Disp Refills  . HYDROcodone-acetaminophen (NORCO/VICODIN) 5-325 MG tablet 90 tablet 0    Sig: Take 1 tablet by mouth every 8 (eight) hours as needed.  . DULoxetine (CYMBALTA) 60 MG capsule 30 capsule 5    Sig: Take 1 capsule (60 mg total) by mouth daily.  Marland Kitchen gabapentin (NEURONTIN) 800 MG tablet 120 tablet 5    Sig: Take 1  tablet (800 mg total) by mouth every 6 (six) hours.  Marland Kitchen HYDROcodone-acetaminophen (NORCO) 5-325 MG tablet 90 tablet 0    Sig: Take 1 tablet by mouth every 8 (eight) hours as needed for moderate pain.  Marland Kitchen HYDROcodone-acetaminophen (NORCO) 5-325 MG tablet 90 tablet 0    Sig: Take 1 tablet by mouth every 8 (  eight) hours as needed for moderate pain.  Marland Kitchen zolpidem (AMBIEN CR) 12.5 MG CR tablet 30 tablet 2    Sig: Take 1 tablet (12.5 mg total) by mouth at bedtime as needed for sleep.  . Melatonin 10 MG CAPS 60 capsule 2    Sig: Take 20 mg by mouth at bedtime as needed.    ROS: Constitutional: Afebrile, no chills, well hydrated and well nourished Gastrointestinal: negative Musculoskeletal:negative Neurological: negative Behavioral/Psych: negative  PFSH: Medical:  [name]  has a past medical history of Sinus problem; Swelling; Thyroid disease; Diabetes ([place]); Venous stasis dermatitis of both lower extremities; blood clots; HBP (high blood pressure); Kidney disease; Depression; Dysthymic disorder; Paroxysmal atrial fibrillation ([place]) (2007); Asthma; GERD (gastroesophageal reflux disease); Heart murmur; DVT (deep venous thrombosis) ([place]); Insomnia; Atrial fibrillation, controlled ([place]); Cystocele; Neuropathy involving both lower extremities ([place]); Anxiety and depression; Obesity, Class II, BMI 35-39.9, with comorbidity ([place]); Cholecystolithiasis; Vaginitis; Kidney stone; Adrenal mass ([place]); Sleep apnea; Reflux; Anxiety; and Arthritis. Family: family history includes Breast cancer in her maternal aunt; Cancer in her father and maternal aunt; Cerebral aneurysm in her father; Diabetes in her father; Hypertension in her father; Kidney disease in her mother; Mental illness in her sister; Peripheral vascular disease in her father; Skin cancer in her father; Varicose Veins in her mother. Surgical:  has past surgical history that includes Hernia repair; Ankle surgery (Right); Vaginal hysterectomy; Sleeve  Gastroplasty; Lithotripsy; Kidney surgery; transthoracic echocardiogram (08/03/2013); transesophageal echocardiogram (08/08/2013); Tonsillectomy; Total knee arthroplasty; Hiatal hernia repair; and Joint replacement. Tobacco:  reports that she has never smoked. She has never used smokeless tobacco. Alcohol:  reports that she does not drink alcohol. Drug:  reports that she does not use illicit drugs.  Physical Exam: Vitals:  Today's Vitals   01/16/15 1445 01/16/15 1447  BP: 174/75   Pulse: 62   Temp: 98.2 F (36.8 C)   Resp: 20   Height: 5\' 5"  (1.651 m)   Weight: 242 lb (109.77 kg)   SpO2: 100%   PainSc: 9  9   PainLoc: Shoulder   Calculated BMI: Body mass index is 40.27 kg/(m^2). General appearance: alert, cooperative, appears stated age and mild distress Eyes: conjunctivae/corneas clear. PERRL, EOM's intact. Fundi benign. Lungs: No evidence respiratory distress, no audible rales or ronchi and no use of accessory muscles of respiration Neck: no adenopathy, no carotid bruit, no JVD, supple, symmetrical, trachea midline and thyroid not enlarged, symmetric, no tenderness/mass/nodules Back: symmetric, no curvature. ROM normal. No CVA tenderness. Extremities: extremities normal, atraumatic, no cyanosis or edema Pulses: 2+ and symmetric Skin: Skin color, texture, turgor normal. No rashes or lesions Neurologic: Gait: Antalgic    Assessment: Encounter Diagnosis:  Primary Diagnosis: Chronic pain [G89.29]  Plan: [name] was seen today for shoulder pain.  Diagnoses and all orders for this visit:  Chronic pain -     HYDROcodone-acetaminophen (NORCO/VICODIN) 5-325 MG tablet; Take 1 tablet by mouth every 8 (eight) hours as needed. -     HYDROcodone-acetaminophen (NORCO) 5-325 MG tablet; Take 1 tablet by mouth every 8 (eight) hours as needed for moderate pain. -     HYDROcodone-acetaminophen (NORCO) 5-325 MG tablet; Take 1 tablet by mouth every 8 (eight) hours as needed for moderate  pain.  Long term current use of opiate analgesic  Long term prescription opiate use  Opiate use  Encounter for therapeutic drug level monitoring  Lateral femoral cutaneous entrapment syndrome, right  Bilateral foot pain  Chronic right shoulder pain  Depression with anxiety -  DULoxetine (CYMBALTA) 60 MG capsule; Take 1 capsule (60 mg total) by mouth daily.  Diabetic peripheral neuropathy (HCC)  Other orders -     gabapentin (NEURONTIN) 800 MG tablet; Take 1 tablet (800 mg total) by mouth every 6 (six) hours. -     zolpidem (AMBIEN CR) 12.5 MG CR tablet; Take 1 tablet (12.5 mg total) by mouth at bedtime as needed for sleep. -     Melatonin 10 MG CAPS; Take 20 mg by mouth at bedtime as needed.     Patient Instructions   GENERAL RISKS AND COMPLICATIONS  What are the risk, side effects and possible complications? Generally speaking, most procedures are safe.  However, with any procedure there are risks, side effects, and the possibility of complications.  The risks and complications are dependent upon the sites that are lesioned, or the type of nerve block to be performed.  The closer the procedure is to the spine, the more serious the risks are.  Great care is taken when placing the radio frequency needles, block needles or lesioning probes, but sometimes complications can occur. 1. Infection: Any time there is an injection through the skin, there is a risk of infection.  This is why sterile conditions are used for these blocks.  There are four possible types of infection. 1. Localized skin infection. 2. Central Nervous System Infection-This can be in the form of Meningitis, which can be deadly. 3. Epidural Infections-This can be in the form of an epidural abscess, which can cause pressure inside of the spine, causing compression of the spinal cord with subsequent paralysis. This would require an emergency surgery to decompress, and there are no guarantees that the patient would  recover from the paralysis. 4. Discitis-This is an infection of the intervertebral discs.  It occurs in about 1% of discography procedures.  It is difficult to treat and it may lead to surgery.        2. Pain: the needles have to go through skin and soft tissues, will cause soreness.       3. Damage to internal structures:  The nerves to be lesioned may be near blood vessels or    other nerves which can be potentially damaged.       4. Bleeding: Bleeding is more common if the patient is taking blood thinners such as  aspirin, Coumadin, Ticiid, Plavix, etc., or if he/she have some genetic predisposition  such as hemophilia. Bleeding into the spinal canal can cause compression of the spinal  cord with subsequent paralysis.  This would require an emergency surgery to  decompress and there are no guarantees that the patient would recover from the  paralysis.       5. Pneumothorax:  Puncturing of a lung is a possibility, every time a needle is introduced in  the area of the chest or upper back.  Pneumothorax refers to free air around the  collapsed lung(s), inside of the thoracic cavity (chest cavity).  Another two possible  complications related to a similar event would include: Hemothorax and Chylothorax.   These are variations of the Pneumothorax, where instead of air around the collapsed  lung(s), you may have blood or chyle, respectively.       6. Spinal headaches: They may occur with any procedures in the area of the spine.       7. Persistent CSF (Cerebro-Spinal Fluid) leakage: This is a rare problem, but may occur  with prolonged intrathecal or epidural catheters either due to  the formation of a fistulous  track or a dural tear.       8. Nerve damage: By working so close to the spinal cord, there is always a possibility of  nerve damage, which could be as serious as a permanent spinal cord injury with  paralysis.       9. Death:  Although rare, severe deadly allergic reactions known as "Anaphylactic   reaction" can occur to any of the medications used.      10. Worsening of the symptoms:  We can always make thing worse.  What are the chances of something like this happening? Chances of any of this occuring are extremely low.  By statistics, you have more of a chance of getting killed in a motor vehicle accident: while driving to the hospital than any of the above occurring .  Nevertheless, you should be aware that they are possibilities.  In general, it is similar to taking a shower.  Everybody knows that you can slip, hit your head and get killed.  Does that mean that you should not shower again?  Nevertheless always keep in mind that statistics do not mean anything if you happen to be on the wrong side of them.  Even if a procedure has a 1 (one) in a 1,000,000 (million) chance of going wrong, it you happen to be that one..Also, keep in mind that by statistics, you have more of a chance of having something go wrong when taking medications.  Who should not have this procedure? If you are on a blood thinning medication (e.g. Coumadin, Plavix, see list of "Blood Thinners"), or if you have an active infection going on, you should not have the procedure.  If you are taking any blood thinners, please inform your physician.  How should I prepare for this procedure?  Do not eat or drink anything at least six hours prior to the procedure.  Bring a driver with you .  It cannot be a taxi.  Come accompanied by an adult that can drive you back, and that is strong enough to help you if your legs get weak or numb from the local anesthetic.  Take all of your medicines the morning of the procedure with just enough water to swallow them.  If you have diabetes, make sure that you are scheduled to have your procedure done first thing in the morning, whenever possible.  If you have diabetes, take only half of your insulin dose and notify our nurse that you have done so as soon as you arrive at the clinic.  If  you are diabetic, but only take blood sugar pills (oral hypoglycemic), then do not take them on the morning of your procedure.  You may take them after you have had the procedure.  Do not take aspirin or any aspirin-containing medications, at least eleven (11) days prior to the procedure.  They may prolong bleeding.  Wear loose fitting clothing that may be easy to take off and that you would not mind if it got stained with Betadine or blood.  Do not wear any jewelry or perfume  Remove any nail coloring.  It will interfere with some of our monitoring equipment.  NOTE: Remember that this is not meant to be interpreted as a complete list of all possible complications.  Unforeseen problems may occur.  BLOOD THINNERS The following drugs contain aspirin or other products, which can cause increased bleeding during surgery and should not be taken for 2 weeks prior  to and 1 week after surgery.  If you should need take something for relief of minor pain, you may take acetaminophen which is found in Tylenol,m Datril, Anacin-3 and Panadol. It is not blood thinner. The products listed below are.  Do not take any of the products listed below in addition to any listed on your instruction sheet.  A.P.C or A.P.C with Codeine Codeine Phosphate Capsules #3 Ibuprofen Ridaura  ABC compound Congesprin Imuran rimadil  Advil Cope Indocin Robaxisal  Alka-Seltzer Effervescent Pain Reliever and Antacid Coricidin or Coricidin-D  Indomethacin Rufen  Alka-Seltzer plus Cold Medicine Cosprin Ketoprofen S-A-C Tablets  Anacin Analgesic Tablets or Capsules Coumadin Korlgesic Salflex  Anacin Extra Strength Analgesic tablets or capsules CP-2 Tablets Lanoril Salicylate  Anaprox Cuprimine Capsules Levenox Salocol  Anexsia-D Dalteparin Magan Salsalate  Anodynos Darvon compound Magnesium Salicylate Sine-off  Ansaid Dasin Capsules Magsal Sodium Salicylate  Anturane Depen Capsules Marnal Soma  APF Arthritis pain formula Dewitt's  Pills Measurin Stanback  Argesic Dia-Gesic Meclofenamic Sulfinpyrazone  Arthritis Bayer Timed Release Aspirin Diclofenac Meclomen Sulindac  Arthritis pain formula Anacin Dicumarol Medipren Supac  Analgesic (Safety coated) Arthralgen Diffunasal Mefanamic Suprofen  Arthritis Strength Bufferin Dihydrocodeine Mepro Compound Suprol  Arthropan liquid Dopirydamole Methcarbomol with Aspirin Synalgos  ASA tablets/Enseals Disalcid Micrainin Tagament  Ascriptin Doan's Midol Talwin  Ascriptin A/D Dolene Mobidin Tanderil  Ascriptin Extra Strength Dolobid Moblgesic Ticlid  Ascriptin with Codeine Doloprin or Doloprin with Codeine Momentum Tolectin  Asperbuf Duoprin Mono-gesic Trendar  Aspergum Duradyne Motrin or Motrin IB Triminicin  Aspirin plain, buffered or enteric coated Durasal Myochrisine Trigesic  Aspirin Suppositories Easprin Nalfon Trillsate  Aspirin with Codeine Ecotrin Regular or Extra Strength Naprosyn Uracel  Atromid-S Efficin Naproxen Ursinus  Auranofin Capsules Elmiron Neocylate Vanquish  Axotal Emagrin Norgesic Verin  Azathioprine Empirin or Empirin with Codeine Normiflo Vitamin E  Azolid Emprazil Nuprin Voltaren  Bayer Aspirin plain, buffered or children's or timed BC Tablets or powders Encaprin Orgaran Warfarin Sodium  Buff-a-Comp Enoxaparin Orudis Zorpin  Buff-a-Comp with Codeine Equegesic Os-Cal-Gesic   Buffaprin Excedrin plain, buffered or Extra Strength Oxalid   Bufferin Arthritis Strength Feldene Oxphenbutazone   Bufferin plain or Extra Strength Feldene Capsules Oxycodone with Aspirin   Bufferin with Codeine Fenoprofen Fenoprofen Pabalate or Pabalate-SF   Buffets II Flogesic Panagesic   Buffinol plain or Extra Strength Florinal or Florinal with Codeine Panwarfarin   Buf-Tabs Flurbiprofen Penicillamine   Butalbital Compound Four-way cold tablets Penicillin   Butazolidin Fragmin Pepto-Bismol   Carbenicillin Geminisyn Percodan   Carna Arthritis Reliever Geopen Persantine    Carprofen Gold's salt Persistin   Chloramphenicol Goody's Phenylbutazone   Chloromycetin Haltrain Piroxlcam   Clmetidine heparin Plaquenil   Cllnoril Hyco-pap Ponstel   Clofibrate Hydroxy chloroquine Propoxyphen         Before stopping any of these medications, be sure to consult the physician who ordered them.  Some, such as Coumadin (Warfarin) are ordered to prevent or treat serious conditions such as "deep thrombosis", "pumonary embolisms", and other heart problems.  The amount of time that you may need off of the medication may also vary with the medication and the reason for which you were taking it.  If you are taking any of these medications, please make sure you notify your pain physician before you undergo any procedures.         Lateral Femoral Cutaneous Nerve Block Patient Information  Description: The lateral femoral cutaneous nerve of the thigh is a purely sensory nerve that can become entrapped or  irritated for a number of reasons.  The pain associated with this condition is called meralgia paraesthetica.  Patients affected with this syndrome have burning pain or abnormal sensation along the lateral aspect of the thigh.  The pain can be worsened by prolonged walking, standing, or constrictive garments around the house.   The diagnosis can be confirmed and treatment initiated by blocking the nerve with local anesthetic (like Novocaine).  At times, a steroid solution may be injected at the same time.  The site of injection is through a tiny needle in the left, lower quadrant of the abdomen.   The entire block usually lasts less than 5 minutes.  Conditions which may be treated by lateral femoral cutaneous nerve block:   Meralagia paraesthetica  Preparation for the injection:  1. Do not eat any solid food or dairy products within 6 hours of your appointment.  2. You may drink clear liquids up to 2 hours before appointment.  Clear liquids include water, black coffee, juice  or soda. No milk or cream please. 3. You may take your regular medication, including pain medications, with a sip of water before your appointment.  Diabetics should hold regular insulin (if taken separately) and take 1/2 normal NPH dose the morning of the procedure.  Carry some sugar containing items with you to your appointment. 4. A driver must accompany you and be prepared to drive you home after your procedure. 5. Bring all you current medications with you 6. An IV may be inserted and sedation may be given at the discretion of the physician. 7. A blood pressure cuff, EKG and other monitors will often be applied during the procedure.  Some patients may need to have extra oxygen administered for a short period. 8. You will be asked to provide medical information, including your allergies and medications, prior to the procedure.  We must know immediately if you are taking blood thinners (like Coumadin/Warfarin) or if you allergic to IV iodine contrast (dye)  We must know if you could possible be pregnant.   Possible side-effects:   Bleeding from needle site  Infection (rate, may require surgery)  Nerve injury (rare)  Numbness and Tingling (temporary)  Light-headedness (temporary)  Pain at injection site (several day)  Decreased blood pressure (rare, temporary)  Weakness in leg (temporary)  Call if you experience:  Hives or difficulty breathing (go to the emergency room)  Inflammation or drainage at the injection site(s)  Please note:  Although the local anesthetic injected can often make your leg feel good for several hours after the injection,  The pain may return.  It takes 3-7 days for steroids to work.  You may not notice any pain relief for at least one week.  If effective, we will often do a series of injections spaced 3-6 weeks apart to maximally decrease your pain.  If you have any questions, please cll [phone] [facility]     Medications discontinued today:  Medications Discontinued During This Encounter  Medication Reason  . HYDROcodone-acetaminophen (NORCO/VICODIN) 5-325 MG per tablet Reorder  . DULoxetine (CYMBALTA) 60 MG capsule Reorder  . zolpidem (AMBIEN CR) 12.5 MG CR tablet Reorder  . Lorcaserin HCl (BELVIQ) 10 MG TABS Error  . predniSONE (DELTASONE) 10 MG tablet Error  . levofloxacin (LEVAQUIN) 500 MG tablet Error   Medications administered today:  [name] had no medications administered during this visit.  Primary Care Physician: Bobetta Lime, MD Location: [facility]  Facility Note by: Kathlen Brunswick. Dossie Arbour, M.D, DABA, DABAPM, DABPM, DABIPP, FIPP

## 2015-01-24 ENCOUNTER — Other Ambulatory Visit: Payer: Self-pay | Admitting: Specialist

## 2015-01-24 DIAGNOSIS — M7541 Impingement syndrome of right shoulder: Secondary | ICD-10-CM

## 2015-01-25 LAB — TOXASSURE SELECT 13 (MW), URINE: PDF: 0

## 2015-01-27 IMAGING — RF DG UGI W/ KUB
9 of 10 series · 14 of 23 positions shown · non-contrast
Comparison: none

REASON FOR EXAM: dysphagia and GERD
COMMENTS:

[Series 1: fluoro_barium 2fps_bw · 0.17mm/px · 2 of 20 frames shown (1 of 9)]
[frame 4/20]
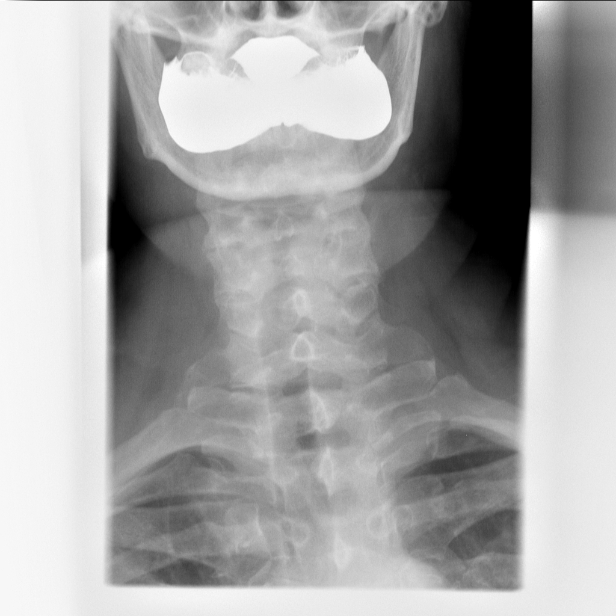
[frame 11/20]
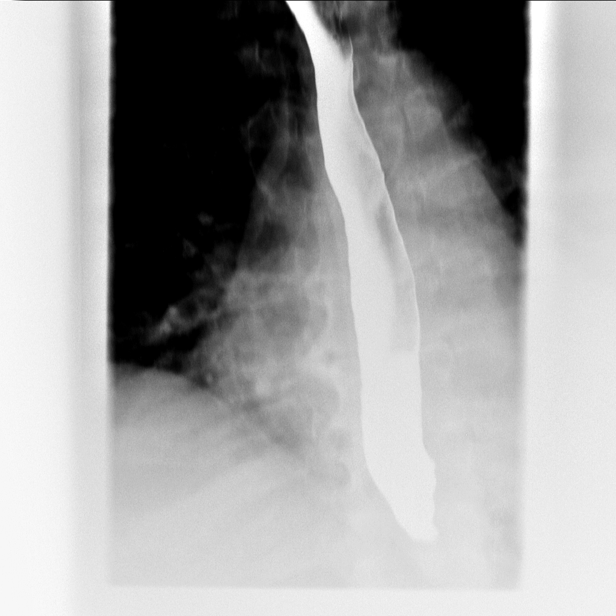

[Series 2: fluoro_barium 2fps_bw · 0.17mm/px · 3 of 18 frames shown (2 of 9)]
[frame 3/18]
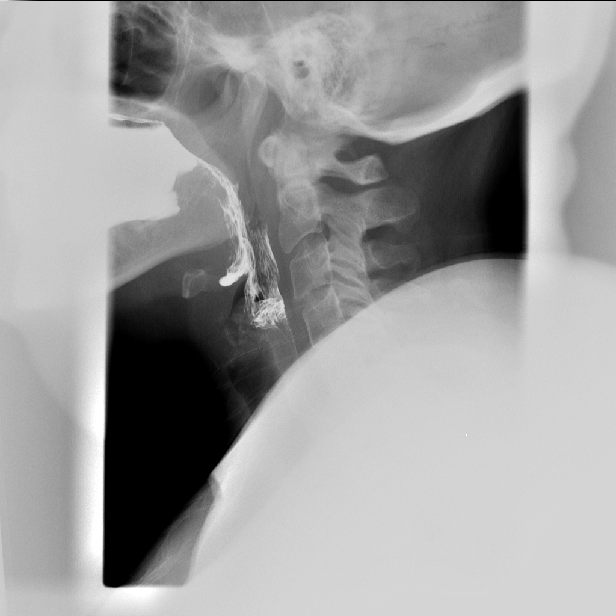
[frame 10/18]
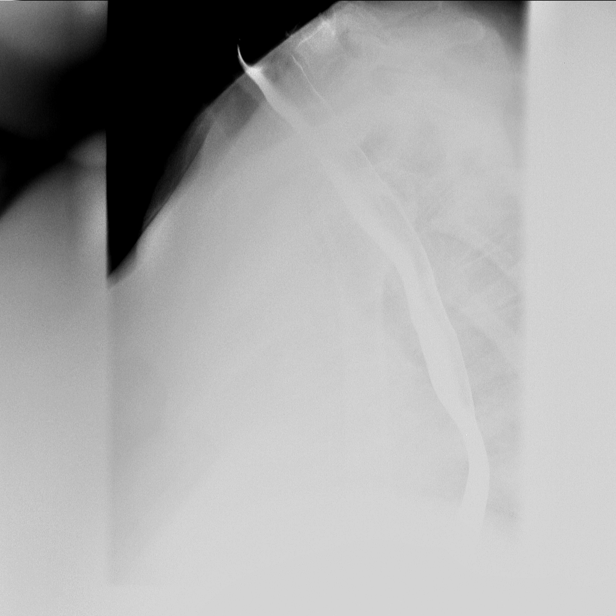
[frame 18/18]
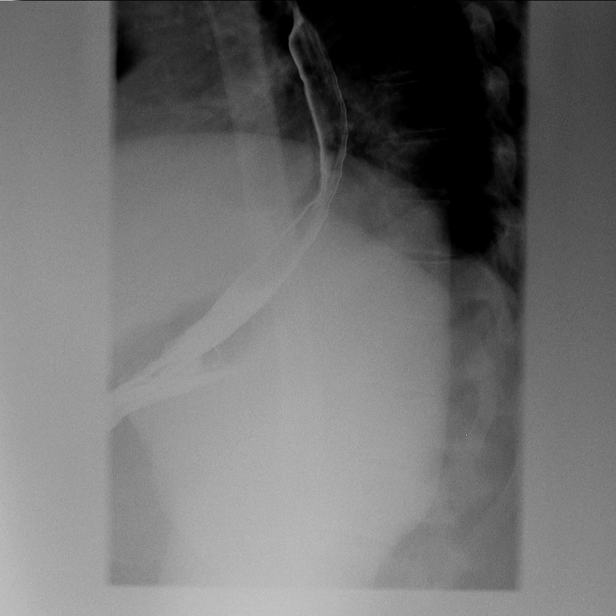

[Series 3: fluoro_barium 2fps_bw · 0.17mm/px · 2 of 20 frames shown (3 of 9)]
[frame 11/20]
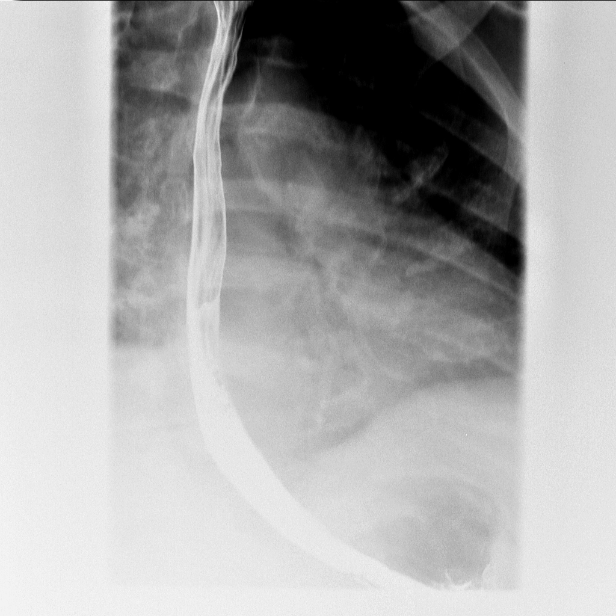
[frame 18/20]
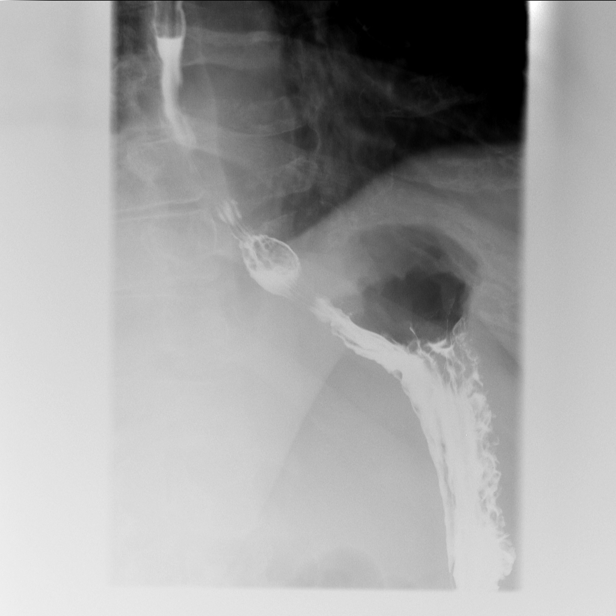

[Series 4: fluoro_barium 2fps_bw · 0.17mm/px · 2 of 6 frames shown (4 of 9)]
[frame 4/6]
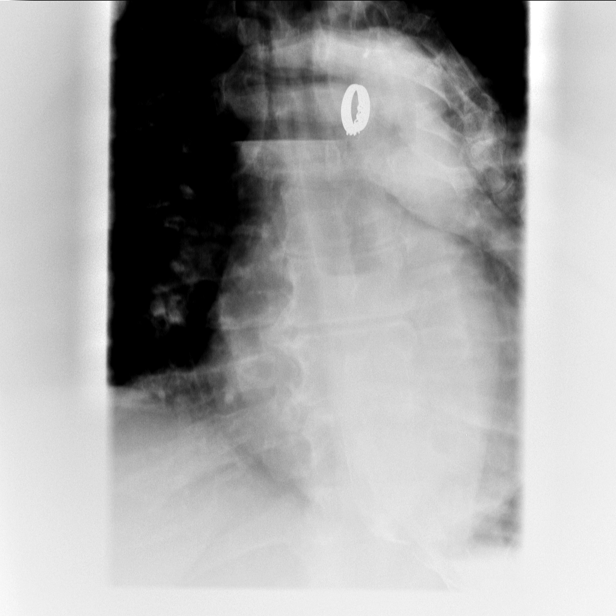
[frame 6/6]
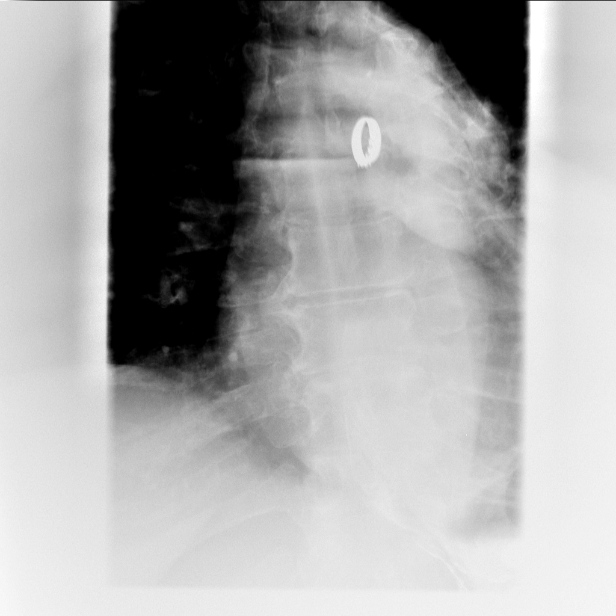

[Series 5: fluoro_barium 2fps_bw · 0.17mm/px · 1 of 15 frames shown (5 of 9)]
[frame 8/15]
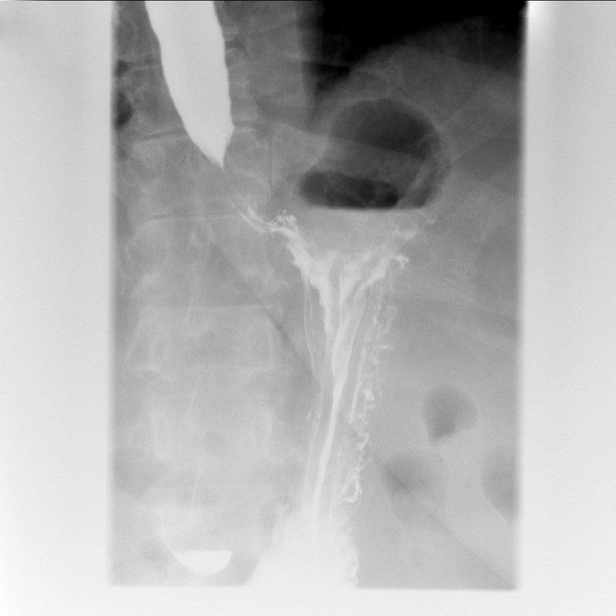

[Series 6: fluoro_barium 2fps_bw · 0.17mm/px · 1 of 1 slices shown (6 of 9)]
[im 1/1]
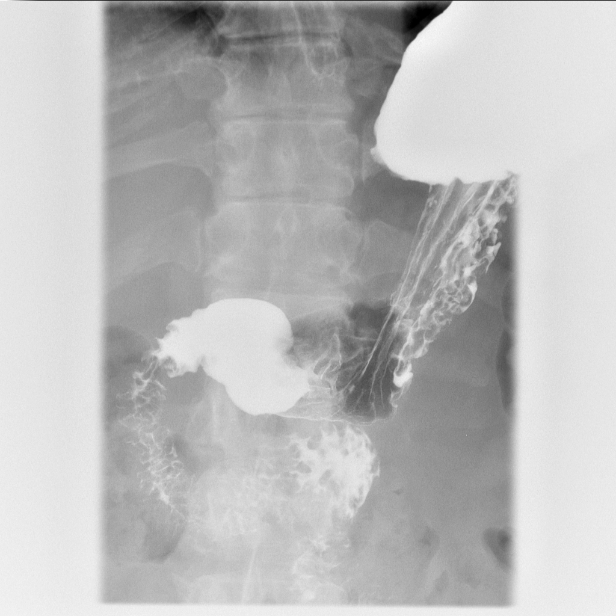

[Series 7: fluoro_barium 2fps_bw · 0.17mm/px · 1 of 1 slices shown (7 of 9)]
[im 1/1]
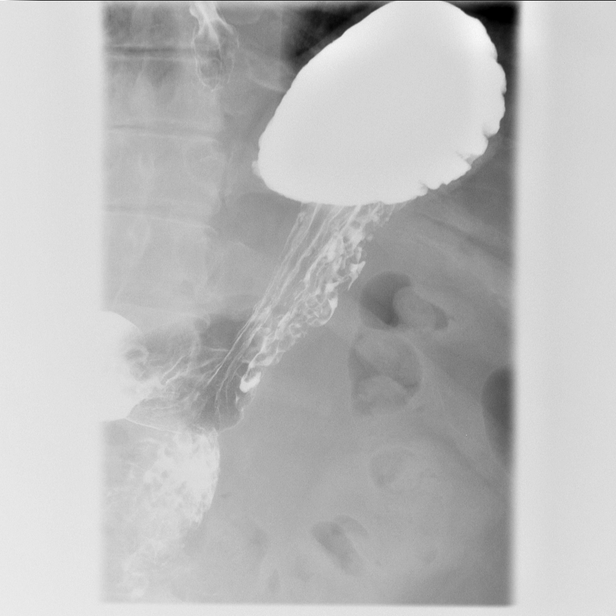

[Series 9: fluoro_barium 2fps_bw · 0.18mm/px · 1 of 2 frames shown (8 of 9)]
[frame 1/2]
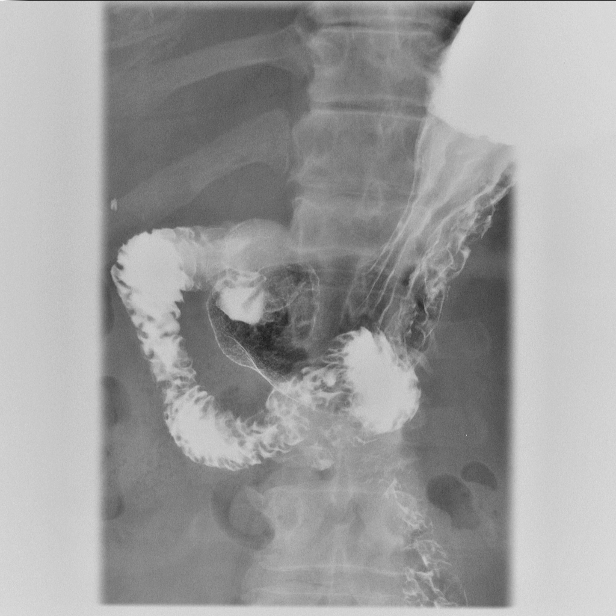

[Series 10: fluoro_barium 2fps_bw · 0.17mm/px · 1 of 1 slices shown (9 of 9)]
[im 1/1]
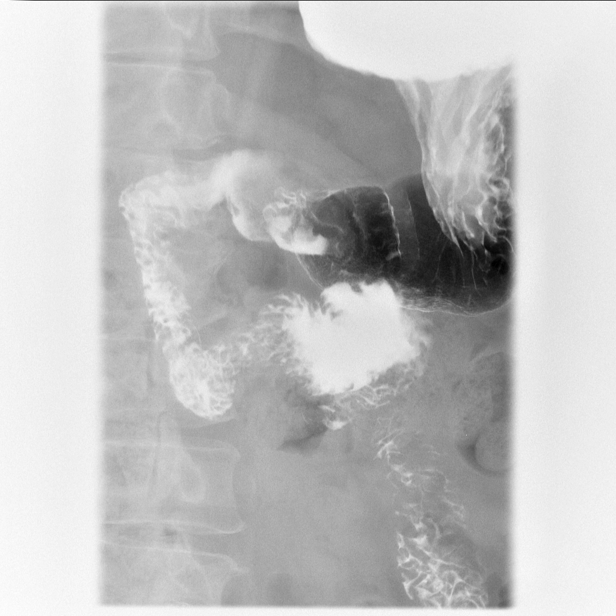

[14 of 23 positions shown; findings below may reference images not displayed]

PROCEDURE:     FL  - FL UPPER GI W/ BARIUM SWALLOW  - November 09, 2012 [DATE]

RESULT:     Single contrast barium swallow and upper GI exam is performed.
No gas producing crystals were available and therefore the study is somewhat
limited with regards to the mucosa. The patient easily ingested a liquid
barium which passed through the esophagus to the stomach. There is no
esophageal stenosis. There is moderate prominence of the cricopharyngeus
causing approximately 50% narrowing from the dorsal aspect of the upper
esophagus. No hiatal hernia was identified. The patient ingested a 12.5 mm
barium-impregnated tablet which passed through the esophagus into the
stomach without delay. The stomach is nondistended. No gross gastric mucosal
abnormalities appreciated. Contrast flows into normal appearing proximal
small bowel. No gastroesophageal reflux could be demonstrated with reflux
maneuvers. The patient was unable to tolerate prone RAO swallow.
IMPRESSION: 1. Mild prominence of the cricopharyngeus muscle narrowing the upper
esophagus.
2. Other findings as discussed above.

[REDACTED]

## 2015-02-06 ENCOUNTER — Ambulatory Visit: Payer: Self-pay | Admitting: Cardiology

## 2015-02-06 ENCOUNTER — Ambulatory Visit: Payer: Self-pay | Admitting: Family Medicine

## 2015-02-06 ENCOUNTER — Ambulatory Visit
Admission: RE | Admit: 2015-02-06 | Discharge: 2015-02-06 | Disposition: A | Discharge status: Home / Self Care | Payer: PPO | Source: Ambulatory Visit | Attending: Specialist | Admitting: Specialist

## 2015-02-06 DIAGNOSIS — M7541 Impingement syndrome of right shoulder: Secondary | ICD-10-CM | POA: Insufficient documentation

## 2015-02-07 ENCOUNTER — Other Ambulatory Visit: Payer: Self-pay | Admitting: Pain Medicine

## 2015-02-10 IMAGING — RF DG UGI W/O KUB
5 of 6 series · 15 of 17 positions shown · non-contrast
Comparison: none

REASON FOR EXAM: evaluate for leak s/p sleeve gastrectomy
COMMENTS:

[Series 11: fluoro_barium 2fps_bw · 0.17mm/px · 2 of 2 frames shown (1 of 5)]
[frame 1/2]
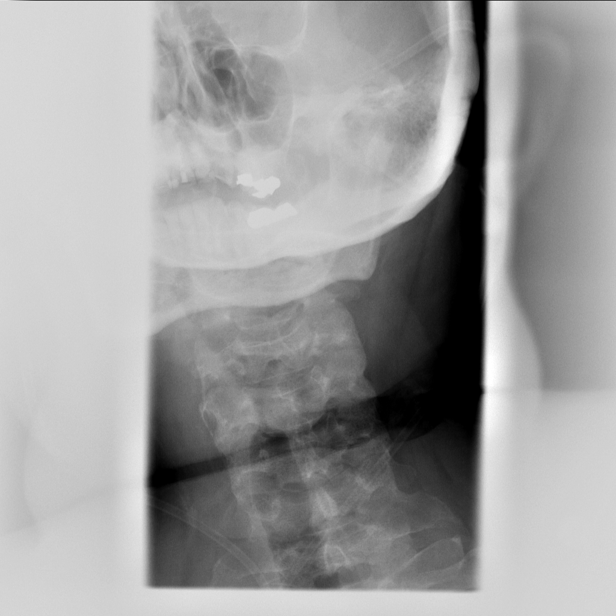
[frame 2/2]
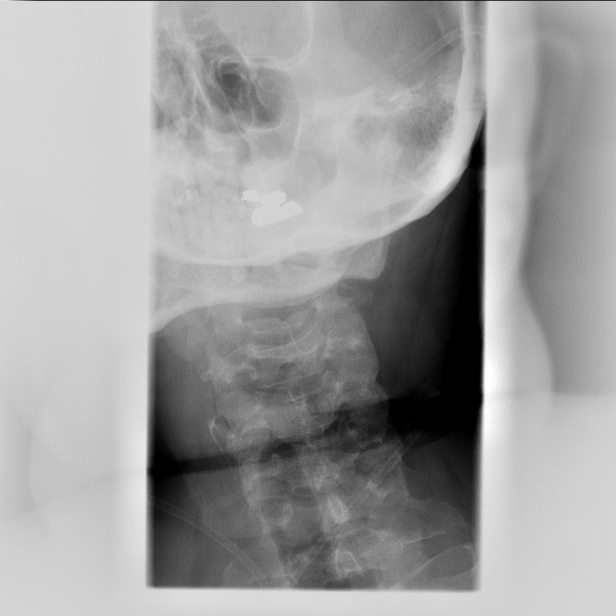

[Series 12: fluoro_barium 2fps_bw · 0.17mm/px · 3 of 20 frames shown (2 of 5)]
[frame 4/20]
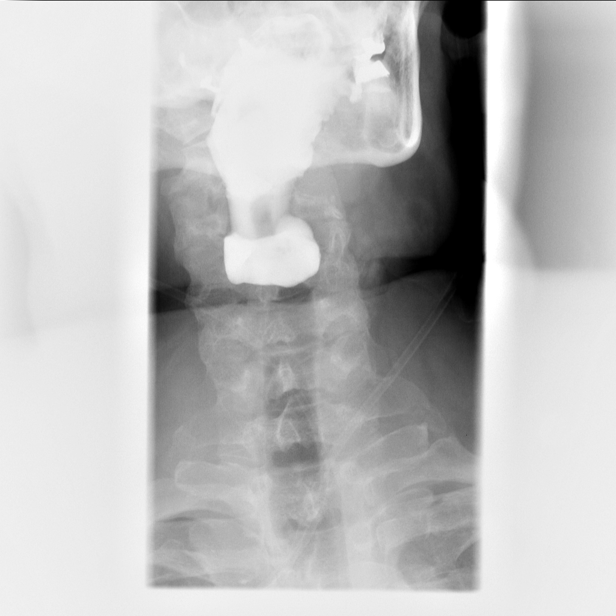
[frame 11/20]
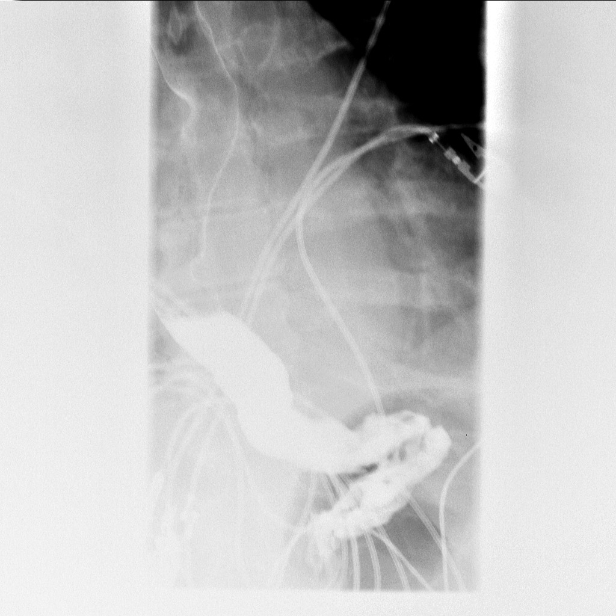
[frame 18/20]
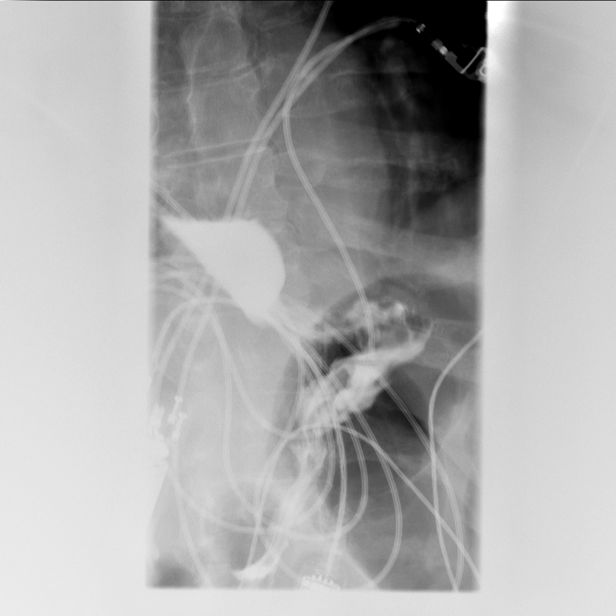

[Series 13: fluoro_barium 2fps_bw · 0.17mm/px · 2 of 2 frames shown (3 of 5)]
[frame 1/2]
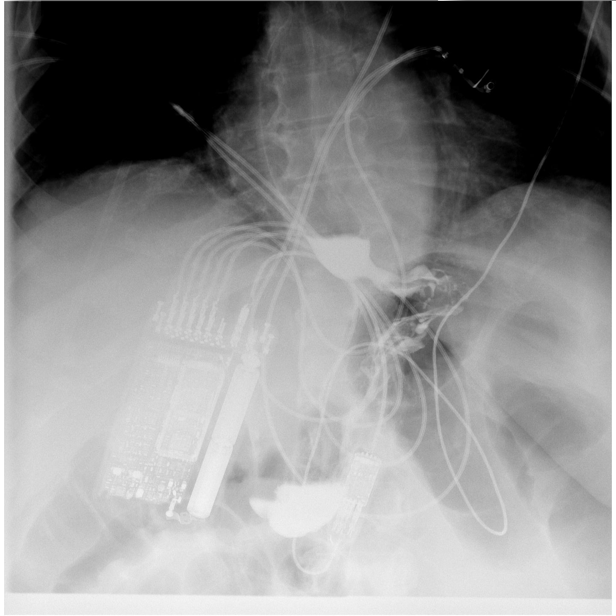
[frame 2/2]
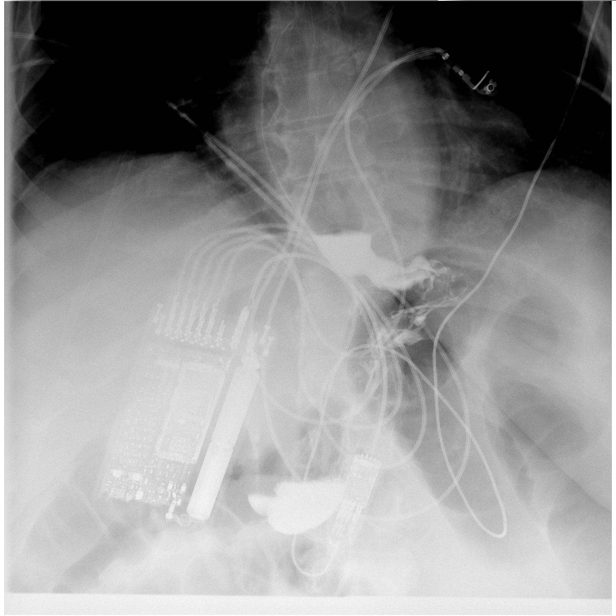

[Series 14: fluoro_barium 2fps_bw · 0.17mm/px · 4 of 17 frames shown (4 of 5)]
[frame 3/17]
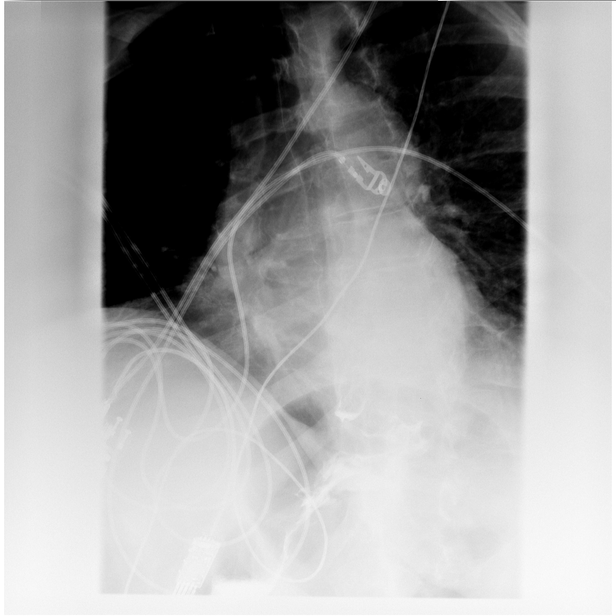
[frame 5/17]
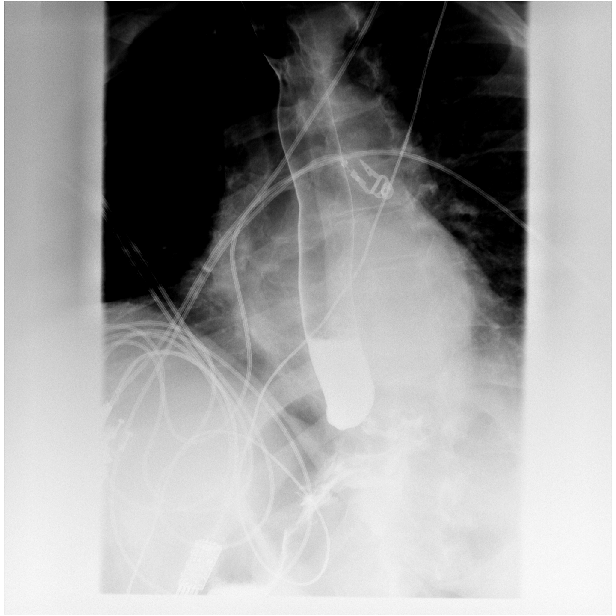
[frame 9/17]
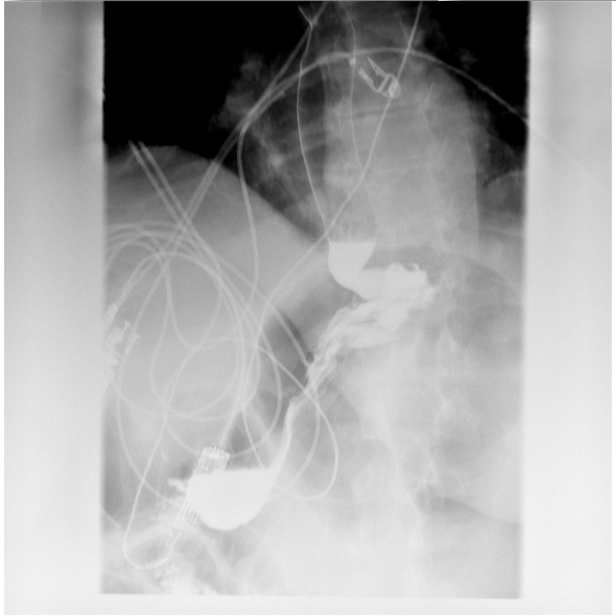
[frame 15/17]
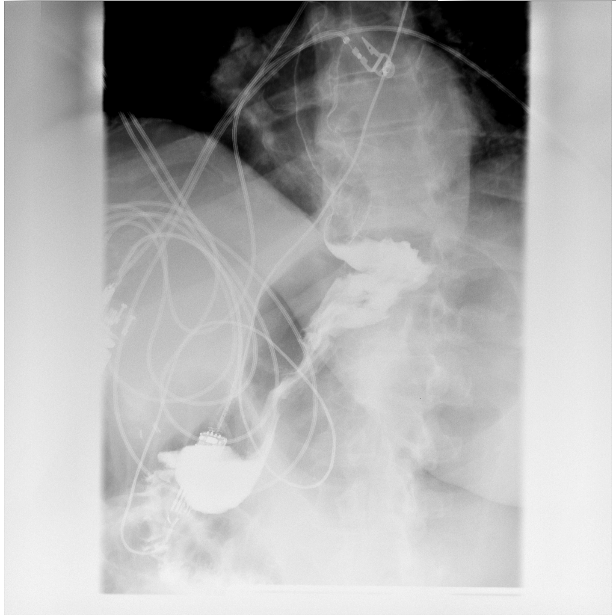

[Series 16: fluoro_barium 2fps_bw · 0.17mm/px · 4 of 20 frames shown (5 of 5)]
[frame 4/20]
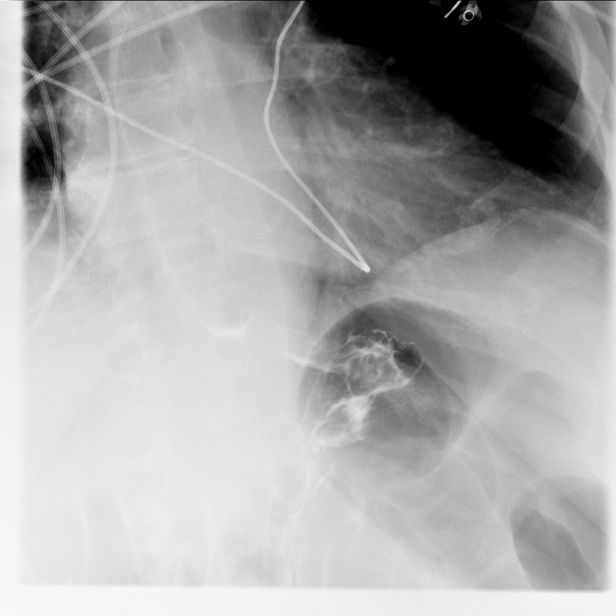
[frame 9/20]
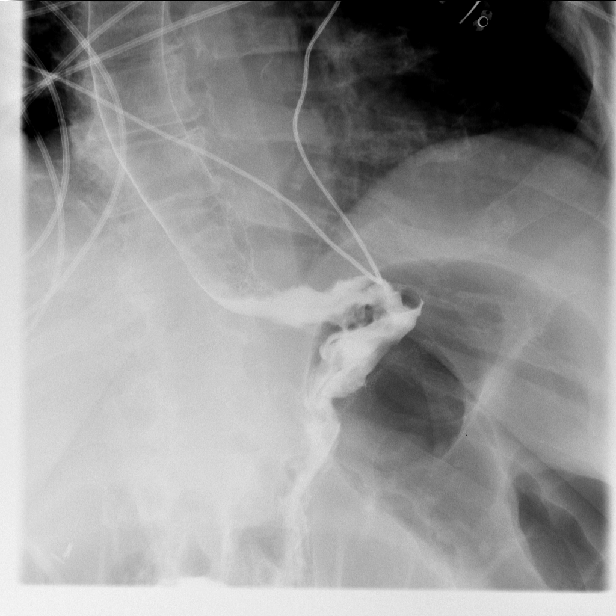
[frame 11/20]
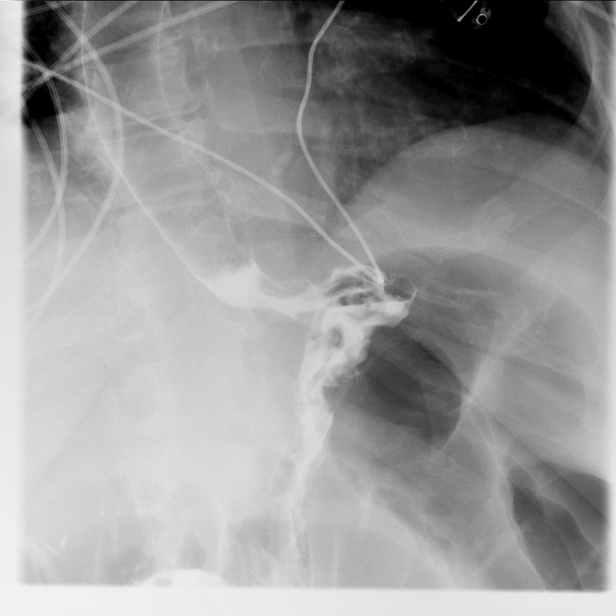
[frame 18/20]
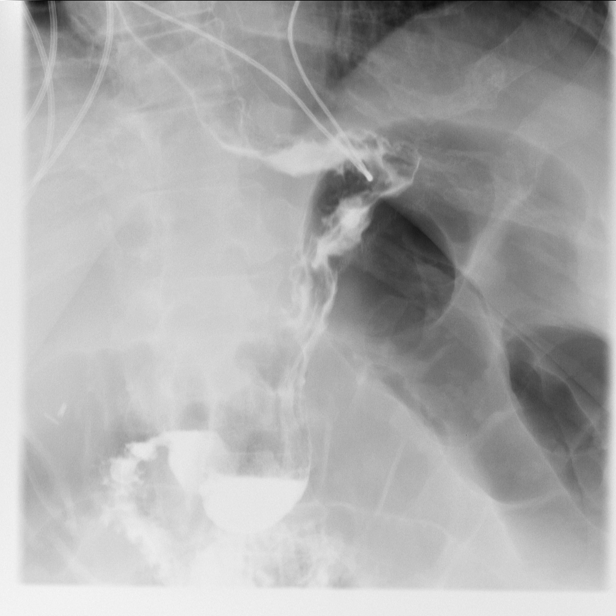

[15 of 17 positions shown; findings below may reference images not displayed]

PROCEDURE:     FL  - FL UPPER GI  - November 23, 2012 [DATE]

RESULT:     Limited Gastrografin upper GI is performed following gastric
surgery. Gastrografin flows through the esophagus into the stomach and
quickly into the small bowel without evidence of extravasation or abnormal
collection to suggest extravasation. The if the patient develops symptoms
concerning for perforation or extravasation then further followup with CT
would be suggested.
IMPRESSION: Please see above.

[REDACTED]

## 2015-02-11 ENCOUNTER — Other Ambulatory Visit: Payer: Self-pay | Admitting: Family Medicine

## 2015-02-12 ENCOUNTER — Other Ambulatory Visit: Payer: Self-pay | Admitting: Pain Medicine

## 2015-02-12 DIAGNOSIS — M19019 Primary osteoarthritis, unspecified shoulder: Secondary | ICD-10-CM

## 2015-02-20 ENCOUNTER — Telehealth: Payer: Self-pay | Admitting: Family Medicine

## 2015-02-20 NOTE — Telephone Encounter (Signed)
Patient has cough/congestion. Think it is due to the people breaking down her bathroom and finding mold. She is requesting antibotic and a refill on her tessalon pearls (only 4 pills left). She is taking prednisone (only 6 left) 30 mg for 5 days. I did give her an appointment for tomorrow afternoon with Dr Rutherford Nail just in case you did not respond to this message in time. And told her if you did call her something in, then we would cancel the appointment. Please send to [facility]. She did give [place] permission to speak with her husband when we call back

## 2015-02-21 ENCOUNTER — Ambulatory Visit (INDEPENDENT_AMBULATORY_CARE_PROVIDER_SITE_OTHER): Payer: PPO | Admitting: Family Medicine

## 2015-02-21 ENCOUNTER — Encounter: Payer: Self-pay | Admitting: Family Medicine

## 2015-02-21 VITALS — BP 154/86 | HR 84 | Temp 97.9°F | Resp 18 | Ht 65.0 in | Wt 258.9 lb

## 2015-02-21 DIAGNOSIS — J0100 Acute maxillary sinusitis, unspecified: Secondary | ICD-10-CM | POA: Diagnosis not present

## 2015-02-21 DIAGNOSIS — J45909 Unspecified asthma, uncomplicated: Secondary | ICD-10-CM

## 2015-02-21 DIAGNOSIS — S0083XA Contusion of other part of head, initial encounter: Secondary | ICD-10-CM

## 2015-02-21 DIAGNOSIS — J40 Bronchitis, not specified as acute or chronic: Secondary | ICD-10-CM | POA: Diagnosis not present

## 2015-02-21 MED ORDER — LEVOFLOXACIN 500 MG PO TABS: 500.0000 mg | ORAL_TABLET | Freq: Every day | ORAL | Status: DC

## 2015-02-21 MED ORDER — ALBUTEROL SULFATE (2.5 MG/3ML) 0.083% IN NEBU: 2.5000 mg | INHALATION_SOLUTION | Freq: Four times a day (QID) | RESPIRATORY_TRACT | Status: DC | PRN

## 2015-02-21 MED ORDER — BENZONATATE 100 MG PO CAPS: 100.0000 mg | ORAL_CAPSULE | Freq: Two times a day (BID) | ORAL | Status: DC | PRN

## 2015-02-21 MED ORDER — PREDNISONE 20 MG PO TABS: 20.0000 mg | ORAL_TABLET | Freq: Every day | ORAL | Status: DC

## 2015-02-21 NOTE — Telephone Encounter (Signed)
Keep appointment with Dr. Rutherford Nail at 1:30 today.
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
## 2015-02-22 ENCOUNTER — Ambulatory Visit: Payer: Self-pay | Admitting: Family Medicine

## 2015-02-25 ENCOUNTER — Ambulatory Visit: Payer: Self-pay | Admitting: Family Medicine

## 2015-02-25 ENCOUNTER — Telehealth: Payer: Self-pay | Admitting: Family Medicine

## 2015-02-25 DIAGNOSIS — B3731 Acute candidiasis of vulva and vagina: Secondary | ICD-10-CM

## 2015-02-25 DIAGNOSIS — B373 Candidiasis of vulva and vagina: Secondary | ICD-10-CM

## 2015-02-25 MED ORDER — FLUCONAZOLE 150 MG PO TABS: 150.0000 mg | ORAL_TABLET | Freq: Once | ORAL | Status: DC

## 2015-02-25 NOTE — Telephone Encounter (Signed)
Patient was prescribed antibiotic last week and now have developed yeast infection and thrush in the mouth. Requesting something be sent to [facility].

## 2015-02-25 NOTE — Telephone Encounter (Signed)
Diflucan sent to pharmacy.

## 2015-02-25 NOTE — Telephone Encounter (Signed)
Refill request was sent to Dr. Ashany Sundaram for approval and submission.  

## 2015-03-06 ENCOUNTER — Ambulatory Visit: Payer: Self-pay | Admitting: Cardiology

## 2015-03-06 ENCOUNTER — Other Ambulatory Visit: Payer: Self-pay | Admitting: Family Medicine

## 2015-03-21 ENCOUNTER — Other Ambulatory Visit: Payer: Self-pay | Admitting: Obstetrics and Gynecology

## 2015-03-26 ENCOUNTER — Encounter: Payer: Self-pay | Admitting: Family Medicine

## 2015-03-26 ENCOUNTER — Ambulatory Visit (INDEPENDENT_AMBULATORY_CARE_PROVIDER_SITE_OTHER): Payer: PPO | Admitting: Family Medicine

## 2015-03-26 VITALS — BP 144/72 | HR 85 | Temp 98.9°F | Resp 16 | Ht 65.0 in | Wt 270.0 lb

## 2015-03-26 DIAGNOSIS — J209 Acute bronchitis, unspecified: Secondary | ICD-10-CM | POA: Diagnosis not present

## 2015-03-26 DIAGNOSIS — L03115 Cellulitis of right lower limb: Secondary | ICD-10-CM

## 2015-03-26 DIAGNOSIS — S80811A Abrasion, right lower leg, initial encounter: Secondary | ICD-10-CM | POA: Insufficient documentation

## 2015-03-26 DIAGNOSIS — J45901 Unspecified asthma with (acute) exacerbation: Secondary | ICD-10-CM | POA: Diagnosis not present

## 2015-03-26 DIAGNOSIS — J45909 Unspecified asthma, uncomplicated: Secondary | ICD-10-CM | POA: Insufficient documentation

## 2015-03-26 DIAGNOSIS — G5793 Unspecified mononeuropathy of bilateral lower limbs: Secondary | ICD-10-CM

## 2015-03-26 MED ORDER — CEPHALEXIN 500 MG PO CAPS: 500.0000 mg | ORAL_CAPSULE | Freq: Three times a day (TID) | ORAL | Status: DC

## 2015-03-26 MED ORDER — BENZONATATE 100 MG PO CAPS: 100.0000 mg | ORAL_CAPSULE | Freq: Three times a day (TID) | ORAL | Status: DC | PRN
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
## 2015-03-26 NOTE — Addendum Note (Signed)
Addended by: Bobetta Lime on: 03/26/2015 12:44 PM   Modules accepted: Miquel Dunn

## 2015-03-29 ENCOUNTER — Encounter: Payer: Self-pay | Admitting: Family Medicine

## 2015-03-29 ENCOUNTER — Other Ambulatory Visit: Payer: Self-pay | Admitting: Family Medicine

## 2015-03-29 ENCOUNTER — Ambulatory Visit (INDEPENDENT_AMBULATORY_CARE_PROVIDER_SITE_OTHER): Payer: PPO | Admitting: Family Medicine

## 2015-03-29 VITALS — BP 114/68 | HR 72 | Temp 98.7°F | Resp 18 | Ht 65.0 in | Wt 270.0 lb

## 2015-03-29 DIAGNOSIS — J45901 Unspecified asthma with (acute) exacerbation: Secondary | ICD-10-CM

## 2015-03-29 DIAGNOSIS — S80811D Abrasion, right lower leg, subsequent encounter: Secondary | ICD-10-CM | POA: Diagnosis not present

## 2015-03-29 DIAGNOSIS — B379 Candidiasis, unspecified: Secondary | ICD-10-CM | POA: Insufficient documentation

## 2015-03-29 DIAGNOSIS — J209 Acute bronchitis, unspecified: Secondary | ICD-10-CM | POA: Diagnosis not present

## 2015-03-29 DIAGNOSIS — T3695XA Adverse effect of unspecified systemic antibiotic, initial encounter: Secondary | ICD-10-CM

## 2015-03-29 MED ORDER — FLUCONAZOLE 150 MG PO TABS: 150.0000 mg | ORAL_TABLET | Freq: Once | ORAL | Status: DC

## 2015-03-29 MED ORDER — PREDNISONE 10 MG (21) PO TBPK: ORAL_TABLET | ORAL | Status: DC

## 2015-03-29 MED ORDER — ALBUTEROL SULFATE HFA 108 (90 BASE) MCG/ACT IN AERS: 1.0000 | INHALATION_SPRAY | RESPIRATORY_TRACT | Status: DC | PRN

## 2015-03-29 MED ORDER — LEVOFLOXACIN 500 MG PO TABS: 500.0000 mg | ORAL_TABLET | Freq: Every day | ORAL | Status: DC

## 2015-03-29 NOTE — Progress Notes (Signed)
Name: [name]   MRN: [id number]    DOB: June 23, 1950   Date:03/29/2015       Progress Note  Subjective  Chief Complaint  Chief Complaint  Patient presents with  . URI    HPI  Patient is here today with concerns regarding the following symptoms congestion, sinus pressure, productive cough which is worsening, sob and achiness that started 2 weeks ago.  Associated with fatigue and malaise. Not associated with fever. Has tried the following home remedies: tessalon pearles  Right shin abrasion healing well.   Past Medical History  Diagnosis Date  . Sinus problem   . Swelling   . Thyroid disease   . Diabetes ([place])   . Venous stasis dermatitis of both lower extremities   . Hx of blood clots   . HBP (high blood pressure)   . Kidney disease   . Depression   . Dysthymic disorder   . Paroxysmal atrial fibrillation ([place]) 2007    In June 2015, Cardiac Event Monitor: Mostly SR/sinus arrhythmia with PVCs that are frequent. Short bursts of A. fib lasting several minutes  . Asthma   . GERD (gastroesophageal reflux disease)   . Heart murmur     Echocardiogram June 2015: Mild MR (possible vegetation seen on TTE, not seen on TEE), normal LV size with moderate concentric LVH. Normal function EF 60-65%. Normal diastolic function. Mild LA dilation.  . DVT (deep venous thrombosis) ([place])   . Insomnia   . Atrial fibrillation, controlled ([place])   . Cystocele   . Neuropathy involving both lower extremities ([place])   . Anxiety and depression   . Obesity, Class II, BMI 35-39.9, with comorbidity ([place])   . Cholecystolithiasis   . Vaginitis   . Kidney stone   . Adrenal mass ([place])   . Sleep apnea   . Reflux   . Anxiety   . Arthritis     Social History  Substance Use Topics  . Smoking status: Never Smoker   . Smokeless tobacco: Never Used  . Alcohol Use: No     Current outpatient prescriptions:  .  albuterol (PROVENTIL) (2.5 MG/3ML) 0.083% nebulizer solution, Take 3 mLs (2.5 mg total) by  nebulization every 6 (six) hours as needed for wheezing or shortness of breath., Disp: 150 mL, Rfl: 1 .  ALPRAZolam (XANAX) 0.5 MG tablet, TAKE ONE TABLET TWICE DAILY AS NEEDED, Disp: 60 tablet, Rfl: 5 .  benzonatate (TESSALON) 100 MG capsule, Take 1 capsule (100 mg total) by mouth 3 (three) times daily as needed for cough., Disp: 30 capsule, Rfl: 0 .  buPROPion (WELLBUTRIN XL) 300 MG 24 hr tablet, TAKE ONE TABLET BY MOUTH EVERY DAY, Disp: 30 tablet, Rfl: 5 .  cephALEXin (KEFLEX) 500 MG capsule, Take 1 capsule (500 mg total) by mouth 3 (three) times daily., Disp: 30 capsule, Rfl: 0 .  DULoxetine (CYMBALTA) 60 MG capsule, Take 1 capsule (60 mg total) by mouth daily., Disp: 30 capsule, Rfl: 5 .  ESTRACE VAGINAL 0.1 MG/GM vaginal cream, INSERT 1 GRAM VAGINALLY 2 TIMES A WEEK, Disp: 42.5 g, Rfl: 0 .  gabapentin (NEURONTIN) 800 MG tablet, Take 1 tablet (800 mg total) by mouth every 6 (six) hours., Disp: 120 tablet, Rfl: 5 .  HYDROcodone-acetaminophen (NORCO) 5-325 MG tablet, Take 1 tablet by mouth every 8 (eight) hours as needed for moderate pain., Disp: 90 tablet, Rfl: 0 .  levothyroxine (SYNTHROID, LEVOTHROID) 150 MCG tablet, TAKE ONE TABLET EVERY MORNING, Disp: 30 tablet, Rfl: 5 .  Melatonin 10 MG CAPS, Take 20 mg by mouth at bedtime as needed., Disp: 60 capsule, Rfl: 2 .  meloxicam (MOBIC) 7.5 MG tablet, Take 7.5 mg by mouth daily., Disp: , Rfl:  .  metFORMIN (GLUCOPHAGE) 1000 MG tablet, TAKE ONE TABLET TWICE DAILY, Disp: 180 tablet, Rfl: 2 .  metFORMIN (GLUCOPHAGE) 500 MG tablet, Take 500 mg by mouth at bedtime., Disp: , Rfl:  .  mirabegron ER (MYRBETRIQ) 25 MG TB24 tablet, Take 1 tablet (25 mg total) by mouth daily., Disp: 30 tablet, Rfl: 12 .  montelukast (SINGULAIR) 10 MG tablet, TAKE ONE TABLET EVERY DAY, Disp: 90 tablet, Rfl: 2 .  pantoprazole (PROTONIX) 40 MG tablet, Take 1 tablet (40 mg total) by mouth daily., Disp: 90 tablet, Rfl: 2 .  PRADAXA 150 MG CAPS capsule, TAKE 1 CAPSULE BY MOUTH  TWICE DAILY, Disp: 180 capsule, Rfl: 5 .  quinapril (ACCUPRIL) 20 MG tablet, TAKE ONE TABLET BY MOUTH EVERY DAY, Disp: 90 tablet, Rfl: 2 .  zolpidem (AMBIEN CR) 12.5 MG CR tablet, Take 1 tablet (12.5 mg total) by mouth at bedtime as needed for sleep., Disp: 30 tablet, Rfl: 2  Allergies  Allergen Reactions  . Aspirin Hives  . Penicillins Hives  . Terramycin [Oxytetracycline] Hives    ROS  Positive for fatigue, nasal congestion, sinus pressure, ear fullness, cough as mentioned in HPI, otherwise all systems reviewed and are negative.  Objective  Filed Vitals:   03/29/15 1512  BP: 114/68  Pulse: 72  Temp: 98.7 F (37.1 C)  TempSrc: Oral  Resp: 18  Height: 5\' 5"  (1.651 m)  Weight: 270 lb (122.471 kg)  SpO2: 97%   Body mass index is 44.93 kg/(m^2).   Physical Exam  Constitutional: Patient is obese and well-nourished. In no acute distress but does appear to be fatigued from acute illness. HEENT:  - Head: Normocephalic and atraumatic.  - Ears: RIGHT TM bulging with minimal clear exudate, LEFT TM bulging with minimal clear exudate.  - Nose: Nasal mucosa boggy and congested.  - Mouth/Throat: Oropharynx is moist with slight erythema of bilateral tonsils without hypertrophy or exudates. Post nasal drainage present.  - Eyes: Conjunctivae clear, EOM movements normal. PERRLA. No scleral icterus.  Neck: Normal range of motion. Neck supple. No JVD present. No thyromegaly present. No local lymphadenopathy. Cardiovascular: Regular rate, regular rhythm with no murmurs heard.  Pulmonary/Chest: Effort normal and breath sounds decreased tidal volume with end expiratory wheezing.  Peripheral vascular: Bilateral LE no edema. Skin: Right lower extremity mid shin 1 cm abrasion with surround skin IMPROVED tenderness, warmth, erythema. Left foot with 3rd digit tip abrasion non infected, limited to breakdown of skin with good granulation tissue formation.  Psychiatric: Patient has a stable mood and  affect. Behavior is normal in office today. Judgment and thought content normal in office today.   Assessment & Plan  1. Acute bronchitis with asthma with acute exacerbation Etiologies include initial allergic rhinitis or viral infection progressing to superimposed bacterial infection. Instructed patient on increasing hydration, nasal saline spray, steam inhalation, NSAID if tolerated and not contraindicated.   - levofloxacin (LEVAQUIN) 500 MG tablet; Take 1 tablet (500 mg total) by mouth daily.  Dispense: 10 tablet; Refill: 0 - predniSONE (STERAPRED UNI-PAK 21 TAB) 10 MG (21) TBPK tablet; Use as directed in a 6 day taper PredPak  Dispense: 21 tablet; Refill: 0 - albuterol (PROVENTIL HFA;VENTOLIN HFA) 108 (90 Base) MCG/ACT inhaler; Inhale 1-2 puffs into the lungs every 4 (four) hours as needed  for wheezing or shortness of breath.  Dispense: 8 g; Refill: 2  2. Antibiotic-induced yeast infection  - fluconazole (DIFLUCAN) 150 MG tablet; Take 1 tablet (150 mg total) by mouth once.  Dispense: 1 tablet; Refill: 0  3. Abrasion of right lower extremity, subsequent encounter Improved but slow to heal. Stop keflex for now and continue topical wound care only.

## 2015-04-04 DIAGNOSIS — M542 Cervicalgia: Secondary | ICD-10-CM | POA: Diagnosis not present

## 2015-04-04 DIAGNOSIS — M5412 Radiculopathy, cervical region: Secondary | ICD-10-CM | POA: Diagnosis not present

## 2015-04-10 DIAGNOSIS — M5412 Radiculopathy, cervical region: Secondary | ICD-10-CM | POA: Diagnosis not present

## 2015-04-10 DIAGNOSIS — M542 Cervicalgia: Secondary | ICD-10-CM | POA: Diagnosis not present

## 2015-04-15 DIAGNOSIS — M5412 Radiculopathy, cervical region: Secondary | ICD-10-CM | POA: Diagnosis not present

## 2015-04-15 DIAGNOSIS — M7541 Impingement syndrome of right shoulder: Secondary | ICD-10-CM | POA: Diagnosis not present

## 2015-04-17 ENCOUNTER — Ambulatory Visit: Payer: PPO | Attending: Pain Medicine | Admitting: Pain Medicine

## 2015-04-17 ENCOUNTER — Other Ambulatory Visit: Payer: Self-pay | Admitting: Pain Medicine

## 2015-04-17 ENCOUNTER — Encounter: Payer: Self-pay | Admitting: Pain Medicine

## 2015-04-17 VITALS — BP 140/72 | HR 79 | Temp 98.1°F | Resp 16 | Ht 65.5 in | Wt 242.0 lb

## 2015-04-17 DIAGNOSIS — F418 Other specified anxiety disorders: Secondary | ICD-10-CM | POA: Diagnosis not present

## 2015-04-17 DIAGNOSIS — G4700 Insomnia, unspecified: Secondary | ICD-10-CM | POA: Diagnosis not present

## 2015-04-17 DIAGNOSIS — E039 Hypothyroidism, unspecified: Secondary | ICD-10-CM | POA: Diagnosis not present

## 2015-04-17 DIAGNOSIS — F1190 Opioid use, unspecified, uncomplicated: Secondary | ICD-10-CM | POA: Diagnosis not present

## 2015-04-17 DIAGNOSIS — K219 Gastro-esophageal reflux disease without esophagitis: Secondary | ICD-10-CM | POA: Insufficient documentation

## 2015-04-17 DIAGNOSIS — Z86718 Personal history of other venous thrombosis and embolism: Secondary | ICD-10-CM | POA: Diagnosis not present

## 2015-04-17 DIAGNOSIS — G8929 Other chronic pain: Secondary | ICD-10-CM | POA: Diagnosis not present

## 2015-04-17 DIAGNOSIS — E1140 Type 2 diabetes mellitus with diabetic neuropathy, unspecified: Secondary | ICD-10-CM | POA: Diagnosis not present

## 2015-04-17 DIAGNOSIS — E1142 Type 2 diabetes mellitus with diabetic polyneuropathy: Secondary | ICD-10-CM

## 2015-04-17 DIAGNOSIS — Z79891 Long term (current) use of opiate analgesic: Secondary | ICD-10-CM | POA: Diagnosis not present

## 2015-04-17 DIAGNOSIS — Z5181 Encounter for therapeutic drug level monitoring: Secondary | ICD-10-CM

## 2015-04-17 DIAGNOSIS — Z79899 Other long term (current) drug therapy: Secondary | ICD-10-CM | POA: Diagnosis not present

## 2015-04-17 DIAGNOSIS — R600 Localized edema: Secondary | ICD-10-CM

## 2015-04-17 DIAGNOSIS — M25511 Pain in right shoulder: Secondary | ICD-10-CM | POA: Insufficient documentation

## 2015-04-17 DIAGNOSIS — R1030 Lower abdominal pain, unspecified: Secondary | ICD-10-CM | POA: Insufficient documentation

## 2015-04-17 DIAGNOSIS — I129 Hypertensive chronic kidney disease with stage 1 through stage 4 chronic kidney disease, or unspecified chronic kidney disease: Secondary | ICD-10-CM | POA: Insufficient documentation

## 2015-04-17 DIAGNOSIS — I480 Paroxysmal atrial fibrillation: Secondary | ICD-10-CM | POA: Insufficient documentation

## 2015-04-17 DIAGNOSIS — R609 Edema, unspecified: Secondary | ICD-10-CM | POA: Insufficient documentation

## 2015-04-17 DIAGNOSIS — E118 Type 2 diabetes mellitus with unspecified complications: Secondary | ICD-10-CM | POA: Insufficient documentation

## 2015-04-17 DIAGNOSIS — G4701 Insomnia due to medical condition: Secondary | ICD-10-CM

## 2015-04-17 DIAGNOSIS — I739 Peripheral vascular disease, unspecified: Secondary | ICD-10-CM | POA: Insufficient documentation

## 2015-04-17 LAB — CBC WITH DIFFERENTIAL/PLATELET
BASOS ABS: 0.1 10*3/uL (ref 0–0.1)
BASOS PCT: 1 %
Eosinophils Absolute: 0.3 10*3/uL (ref 0–0.7)
Eosinophils Relative: 3 %
HEMATOCRIT: 40.6 % (ref 35.0–47.0)
Hemoglobin: 13 g/dL (ref 12.0–16.0)
Lymphocytes Relative: 10 %
Lymphs Abs: 0.8 10*3/uL — ABNORMAL LOW (ref 1.0–3.6)
MCH: 27.3 pg (ref 26.0–34.0)
MCHC: 32 g/dL (ref 32.0–36.0)
MCV: 85.2 fL (ref 80.0–100.0)
MONO ABS: 0.5 10*3/uL (ref 0.2–0.9)
Monocytes Relative: 6 %
NEUTROS ABS: 6.8 10*3/uL — AB (ref 1.4–6.5)
NEUTROS PCT: 80 %
PLATELETS: 242 10*3/uL (ref 150–440)
RBC: 4.76 MIL/uL (ref 3.80–5.20)
RDW: 14.3 % (ref 11.5–14.5)
WBC: 8.5 10*3/uL (ref 3.6–11.0)

## 2015-04-17 LAB — COMPREHENSIVE METABOLIC PANEL
ALBUMIN: 4.3 g/dL (ref 3.5–5.0)
ALT: 15 U/L (ref 14–54)
ANION GAP: 10 (ref 5–15)
AST: 16 U/L (ref 15–41)
Alkaline Phosphatase: 82 U/L (ref 38–126)
BUN: 32 mg/dL — ABNORMAL HIGH (ref 6–20)
CALCIUM: 9.6 mg/dL (ref 8.9–10.3)
CHLORIDE: 102 mmol/L (ref 101–111)
CO2: 28 mmol/L (ref 22–32)
Creatinine, Ser: 0.9 mg/dL (ref 0.44–1.00)
GFR calc non Af Amer: >60 mL/min (ref >60)
GLUCOSE: 138 mg/dL — AB (ref 65–99)
POTASSIUM: 5 mmol/L (ref 3.5–5.1)
SODIUM: 140 mmol/L (ref 135–145)
Total Bilirubin: 0.7 mg/dL (ref 0.3–1.2)
Total Protein: 7.3 g/dL (ref 6.5–8.1)

## 2015-04-17 LAB — SEDIMENTATION RATE: SED RATE: 12 mm/h (ref 0–30)

## 2015-04-17 LAB — C-REACTIVE PROTEIN: CRP: 0.6 mg/dL (ref <1.0)

## 2015-04-17 LAB — MAGNESIUM: MAGNESIUM: 1.8 mg/dL (ref 1.7–2.4)

## 2015-04-17 MED ORDER — ZOLPIDEM TARTRATE ER 12.5 MG PO TBCR: 12.5000 mg | EXTENDED_RELEASE_TABLET | Freq: Every evening | ORAL | Status: DC | PRN

## 2015-04-17 MED ORDER — HYDROCODONE-ACETAMINOPHEN 5-325 MG PO TABS: 1.0000 | ORAL_TABLET | Freq: Three times a day (TID) | ORAL | Status: DC | PRN

## 2015-04-17 MED ORDER — DULOXETINE HCL 60 MG PO CPEP: 60.0000 mg | ORAL_CAPSULE | Freq: Every day | ORAL | Status: DC

## 2015-04-17 MED ORDER — GABAPENTIN 800 MG PO TABS: 800.0000 mg | ORAL_TABLET | Freq: Four times a day (QID) | ORAL | Status: DC

## 2015-04-17 MED ORDER — MELATONIN 10 MG PO CAPS: 20.0000 mg | ORAL_CAPSULE | Freq: Every evening | ORAL | Status: DC | PRN

## 2015-04-17 NOTE — Assessment & Plan Note (Signed)
The patient presents today 04/17/2015 to [facility] with redness and edema of the left foot and a small skin lesion. Because the patient has a history of diabetic peripheral neuropathy, there is a concerned that there may be cellulitis and a possible infection. The patient does have a podiatrist and she has been instructed to follow up with him as soon as possible. To facilitate the workup of the podiatrist, today we have ordered a CBC with differential and an x-ray of the left foot to assess for the possibility of osteomyelitis.

## 2015-04-17 NOTE — Progress Notes (Signed)
Patient's Name: [name] MRN: [id number] DOB: 1950/06/09 DOS: 04/17/2015  Primary Reason(s) for Visit: Encounter for Medication Management CC: Peripheral Neuropathy and Groin Pain   HPI  [name] is a 65 y.o. year old, female patient, who returns today as an established patient. She has Paroxysmal atrial fibrillation ([place]); Hypertension goal BP (blood pressure) < 150/90; Hyperhidrosis - unclear etiology; Venous stasis dermatitis of both lower extremities; Peripheral vascular disease due to secondary diabetes mellitus ([place]); Apnea, sleep; DM (diabetes mellitus) type II controlled peripheral vascular disorder ([place]); Diabetic peripheral neuropathy associated with type 2 diabetes mellitus ([place]); Asthma, moderate persistent, well-controlled; Hypothyroidism, adult; Anxiety and depression; Chronic kidney disease; Allergic contact dermatitis; Bladder cystocele; Gastro-esophageal reflux disease without esophagitis; History of cellulitis; H/O deep venous thrombosis; Personal history of surgery to heart and great vessels, presenting hazards to health; H/O fall; Cannot sleep; Neuropathy involving both lower extremities ([place]); Extreme obesity ([place]); Hernia, rectovaginal; Vascular disorder of lower extremity; Candidal dermatitis; Dryness of vagina; Ulcer of lower extremity ([place]); Major depression, recurrent, chronic ([place]); Marital problems; Comprehensive diabetic foot examination, type 2 DM, encounter for [facility]); Mixed incontinence; Atrophic vaginitis; Myelolipoma of right adrenal gland; Edema of right lower extremity; Atherosclerotic PVD with ulceration ([place]); Ulcer of toe of left foot ([place]); Chronic pain; Long term current use of opiate analgesic; Long term prescription opiate use; Opiate use; Encounter for therapeutic drug level monitoring; Lateral femoral cutaneous entrapment syndrome; Foot pain (Bilateral); Chronic shoulder pain (Right); Diabetic peripheral neuropathy ([place]); Abrasion of right lower extremity;  Acute bronchitis with asthma with acute exacerbation; Antibiotic-induced yeast infection; Insomnia secondary to chronic pain; Edema of left foot; and Diabetic foot (HCC) (Left) on her problem list.. Her primarily concern today is the Peripheral Neuropathy and Groin Pain   The patient returns to the clinics today for pharmacological management of her chronic pain. Today she presents with redness and edema of the left foot which is of concern since she does have a history of severe diabetic peripheral neuropathy. Physical examination would suggest for her to have a diabetic foot with some edema and cellulitis. The patient indicates having a podiatrist and having requested an appointment with him. We'll go ahead and order a CBC and differential as well as an x-ray of the foot to evaluate for possible osteomyelitis.  With arise to her other medications, the patient seems to be doing well and she indicates that she is not quite taking as many pills as we have been prescribing her. She indicates that she takes the hydrocodone when necessary and she uses about 50 pills per month. The same is true with the Ambien, since she started taking the melatonin. She is now down to possibly 20 pills per month. The patient is well aware of the problems with Ambien, both in combination with the opioids and by itself in the form of sleepwalking. She indicates that her husband keeps an eye on her.  Reported Pain Score: 3  (last pain medication last hs) Reported level is inconsistent with clinical obrservations. Pain Type: Chronic pain Pain Location: Groin Pain Orientation: Right Pain Descriptors / Indicators: Burning, Throbbing, Tingling (feels like she stuck her foot into electrical socket.  groin pain radiates down the side of the R leg) Pain Frequency: Intermittent  Date of Last Visit: 01/16/15 Service Provided on Last Visit: Med Refill  Pharmacotherapy  Medication(s): The patient currently takes Norco 5/325 one  tablet every 8 hours when necessary for the pain. We have been prescribing this with the patient. She  indicates using approximately 50 tablets per month. Onset of action: Within expected pharmacological parameters Time to Peak effect: Timing and results are as within normal expected parameters Analgesic Effect: More than 50% Activity Facilitation: Medication(s) allow patient to sit, stand, walk, and do the basic ADLs Perceived Effectiveness: Described as relatively effective, allowing for increase in activities of daily living (ADL) Side-effects or Adverse reactions: None reported Duration of action: Within normal limits for medication [place] PMP: Compliant with practice rules and regulations UDS Results: The patient's last UDS done on 01/16/2015 came back with unexpected results as no hydrocodone was found in the sample. However, because the patient takes the medication on a when necessary basis, this result is not necessarily evidence of noncompliance or opioid diversion. UDS Interpretation: Patient appears to be compliant with practice rules and regulations Medication Assessment Form: Reviewed. Patient indicates being compliant with therapy Treatment compliance: Compliant Substance Use Disorder (SUD) Risk Level: Low Pharmacologic Plan: Continue therapy as is  Lab Work: Illicit Drugs No results found for: THCU, COCAINSCRNUR, PCPSCRNUR, MDMA, AMPHETMU, METHADONE, ETOH  Inflammation Markers Lab Results  Component Value Date   ESRSEDRATE 12 04/17/2015   CRP 0.6 04/17/2015    Renal Function Lab Results  Component Value Date   BUN 32* 04/17/2015   CREATININE 0.90 04/17/2015   GFRAA >60 04/17/2015   GFRNONAA >60 04/17/2015    Hepatic Function Lab Results  Component Value Date   AST 16 04/17/2015   ALT 15 04/17/2015   ALBUMIN 4.3 04/17/2015    Electrolytes Lab Results  Component Value Date   NA 140 04/17/2015   K 5.0 04/17/2015   CL 102 04/17/2015   CALCIUM 9.6 04/17/2015    MG 1.8 04/17/2015    Allergies  [name] is allergic to aspirin; penicillins; and terramycin.  Meds  The patient has a current medication list which includes the following prescription(s): albuterol, albuterol, alprazolam, bupropion, duloxetine, estrace vaginal, gabapentin, hydrocodone-acetaminophen, levothyroxine, melatonin, metformin, metformin, mirabegron er, pantoprazole, pradaxa, quinapril, zolpidem, hydrocodone-acetaminophen, hydrocodone-acetaminophen, and montelukast.  Current Outpatient Prescriptions on File Prior to Visit  Medication Sig  . albuterol (PROVENTIL HFA;VENTOLIN HFA) 108 (90 Base) MCG/ACT inhaler Inhale 1-2 puffs into the lungs every 4 (four) hours as needed for wheezing or shortness of breath.  Marland Kitchen albuterol (PROVENTIL) (2.5 MG/3ML) 0.083% nebulizer solution Take 3 mLs (2.5 mg total) by nebulization every 6 (six) hours as needed for wheezing or shortness of breath.  . ALPRAZolam (XANAX) 0.5 MG tablet TAKE ONE TABLET TWICE DAILY AS NEEDED  . buPROPion (WELLBUTRIN XL) 300 MG 24 hr tablet TAKE ONE TABLET BY MOUTH EVERY DAY  . ESTRACE VAGINAL 0.1 MG/GM vaginal cream INSERT 1 GRAM VAGINALLY 2 TIMES A WEEK  . levothyroxine (SYNTHROID, LEVOTHROID) 150 MCG tablet TAKE ONE TABLET EVERY MORNING  . metFORMIN (GLUCOPHAGE) 1000 MG tablet TAKE ONE TABLET TWICE DAILY (Patient taking differently: 1000 mg in the morning and 500 mg qhs)  . metFORMIN (GLUCOPHAGE) 500 MG tablet Take 500 mg by mouth at bedtime.  . mirabegron ER (MYRBETRIQ) 25 MG TB24 tablet Take 1 tablet (25 mg total) by mouth daily.  . pantoprazole (PROTONIX) 40 MG tablet Take 1 tablet (40 mg total) by mouth daily.  Marland Kitchen PRADAXA 150 MG CAPS capsule TAKE 1 CAPSULE BY MOUTH TWICE DAILY  . quinapril (ACCUPRIL) 20 MG tablet TAKE ONE TABLET BY MOUTH EVERY DAY  . montelukast (SINGULAIR) 10 MG tablet TAKE ONE TABLET EVERY DAY   No current facility-administered medications on file prior to visit.  ROS  Constitutional:  Afebrile, no chills, well hydrated and well nourished Gastrointestinal: negative Musculoskeletal:negative Neurological: negative Behavioral/Psych: negative  PFSH  Medical:  Ms. Vasilakis  has a past medical history of Sinus problem; Swelling; Thyroid disease; Diabetes ([place]); Venous stasis dermatitis of both lower extremities; blood clots; HBP (high blood pressure); Kidney disease; Depression; Dysthymic disorder; Paroxysmal atrial fibrillation ([place]) (2007); Asthma; GERD (gastroesophageal reflux disease); Heart murmur; DVT (deep venous thrombosis) ([place]); Insomnia; Atrial fibrillation, controlled ([place]); Cystocele; Neuropathy involving both lower extremities ([place]); Anxiety and depression; Obesity, Class II, BMI 35-39.9, with comorbidity ([place]); Cholecystolithiasis; Vaginitis; Kidney stone; Adrenal mass ([place]); Sleep apnea; Reflux; Anxiety; and Arthritis. Family: family history includes Breast cancer in her maternal aunt; Cancer in her father and maternal aunt; Cerebral aneurysm in her father; Diabetes in her father; Hypertension in her father; Kidney disease in her mother; Mental illness in her sister; Peripheral vascular disease in her father; Skin cancer in her father; Varicose Veins in her mother. Surgical:  has past surgical history that includes Hernia repair; Ankle surgery (Right); Vaginal hysterectomy; Sleeve Gastroplasty; Lithotripsy; Kidney surgery; transthoracic echocardiogram (08/03/2013); transesophageal echocardiogram (08/08/2013); Tonsillectomy; Total knee arthroplasty; Hiatal hernia repair; and Joint replacement. Tobacco:  reports that she has never smoked. She has never used smokeless tobacco. Alcohol:  reports that she does not drink alcohol. Drug:  reports that she does not use illicit drugs.  Physical Exam  Vitals:  Today's Vitals   04/17/15 1054  BP: 140/72  Pulse: 79  Temp: 98.1 F (36.7 C)  TempSrc: Oral  Resp: 16  Height: 5' 5.5" (1.664 m)  Weight: 242 lb (109.77 kg)  SpO2:  98%  PainSc: 3     Calculated BMI: Body mass index is 39.64 kg/(m^2).  General appearance: alert, cooperative, appears stated age, no distress and morbidly obese Eyes: PERLA Respiratory: No evidence respiratory distress, no audible rales or ronchi and no use of accessory muscles of respiration  Cervical Spine Inspection: Normal anatomy Alignment: Symetrical ROM: Adequate  Upper Extremities Inspection: No gross anomalies detected ROM: Adequate Sensory: Normal Motor: Unremarkable  Thoracic Spine Inspection: No gross anomalies detected Alignment: Symetrical ROM: Adequate Palpation: WNL  Lumbar Spine Inspection: No gross anomalies detected Alignment: Symetrical ROM: Decreased Gait: Antalgic (limping)  Lower Extremities Inspection: The patient presents with edema and redness of the left foot with a small skin lesion that at this point does not look grossly infected, but because the patient has a diabetic peripheral neuropathy, I am concerned that this may be a cellulitis. ROM: Grossly normal Sensory: Impaired due to the diabetic peripheral neuropathy.  Assessment & Plan  Primary Diagnosis & Pertinent Problem List: The primary encounter diagnosis was Chronic pain. Diagnoses of Diabetic peripheral neuropathy ([place]), Long term current use of opiate analgesic, Encounter for therapeutic drug level monitoring, Insomnia secondary to chronic pain, Edema of left foot, Diabetic peripheral neuropathy associated with type 2 diabetes mellitus ([place]), Diabetic foot ([place]) (Left), and Depression with anxiety were also pertinent to this visit.  Visit Diagnosis: 1. Chronic pain   2. Diabetic peripheral neuropathy ([place])   3. Long term current use of opiate analgesic   4. Encounter for therapeutic drug level monitoring   5. Insomnia secondary to chronic pain   6. Edema of left foot   7. Diabetic peripheral neuropathy associated with type 2 diabetes mellitus ([place])   8. Diabetic foot (HCC)  (Left)   9. Depression with anxiety     Assessment: Diabetic foot ([place]) (Left) The patient presents today 04/17/2015 to  [facility] with redness and edema of the left foot and a small skin lesion. Because the patient has a history of diabetic peripheral neuropathy, there is a concerned that there may be cellulitis and a possible infection. The patient does have a podiatrist and she has been instructed to follow up with him as soon as possible. To facilitate the workup of the podiatrist, today we have ordered a CBC with differential and an x-ray of the left foot to assess for the possibility of osteomyelitis.    Plan of Care  Pharmacotherapy (Medications Ordered): Meds ordered this encounter  Medications  . DISCONTD: HYDROcodone-acetaminophen (NORCO) 5-325 MG tablet    Sig: Take 1 tablet by mouth every 8 (eight) hours as needed for moderate pain or severe pain.    Dispense:  90 tablet    Refill:  0    Do not place this medication, or any other prescription from our practice, on "Automatic Refill". Patient may have prescription filled one day early if pharmacy is closed on scheduled refill date. Do not fill until: 04/17/15 To last until: 05/17/15  . gabapentin (NEURONTIN) 800 MG tablet    Sig: Take 1 tablet (800 mg total) by mouth every 6 (six) hours.    Dispense:  120 tablet    Refill:  5    Do not place this medication, or any other prescription from our practice, on "Automatic Refill". Patient may have prescription filled one day early if pharmacy is closed on scheduled refill date.  . Melatonin 10 MG CAPS    Sig: Take 20 mg by mouth at bedtime as needed.    Dispense:  60 capsule    Refill:  2    Do not place this medication, or any other prescription from our practice, on "Automatic Refill". Patient may have prescription filled one day early if pharmacy is closed on scheduled refill date.  Marland Kitchen DISCONTD: zolpidem (AMBIEN CR) 12.5 MG CR tablet    Sig:  Take 1 tablet (12.5 mg total) by mouth at bedtime as needed for sleep.    Dispense:  30 tablet    Refill:  2    Do not place this medication, or any other prescription from our practice, on "Automatic Refill". Patient may have prescription filled one day early if pharmacy is closed on scheduled refill date.  Marland Kitchen DISCONTD: HYDROcodone-acetaminophen (NORCO/VICODIN) 5-325 MG tablet    Sig: Take 1 tablet by mouth every 8 (eight) hours as needed for moderate pain or severe pain.    Dispense:  90 tablet    Refill:  0    Do not place this medication, or any other prescription from our practice, on "Automatic Refill". Patient may have prescription filled one day early if pharmacy is closed on scheduled refill date. Do not fill until: 05/17/15 To last until: 06/13/15  . DISCONTD: HYDROcodone-acetaminophen (NORCO/VICODIN) 5-325 MG tablet    Sig: Take 1 tablet by mouth every 8 (eight) hours as needed for moderate pain or severe pain.    Dispense:  90 tablet    Refill:  0    Do not place this medication, or any other prescription from our practice, on "Automatic Refill". Patient may have prescription filled one day early if pharmacy is closed on scheduled refill date. Do not fill until: 06/13/15 To last until: 07/13/15  . HYDROcodone-acetaminophen (NORCO) 5-325 MG tablet    Sig: Take 1 tablet by mouth every 8 (eight) hours as needed for moderate pain or  severe pain.    Dispense:  50 tablet    Refill:  0    Do not place this medication, or any other prescription from our practice, on "Automatic Refill". Patient may have prescription filled one day early if pharmacy is closed on scheduled refill date. Do not fill until: 04/17/15 To last until: 05/17/15  . HYDROcodone-acetaminophen (NORCO/VICODIN) 5-325 MG tablet    Sig: Take 1 tablet by mouth every 8 (eight) hours as needed for moderate pain or severe pain.    Dispense:  50 tablet    Refill:  0    Do not place this medication, or any other prescription  from our practice, on "Automatic Refill". Patient may have prescription filled one day early if pharmacy is closed on scheduled refill date. Do not fill until: 05/17/15 To last until: 06/13/15  . HYDROcodone-acetaminophen (NORCO/VICODIN) 5-325 MG tablet    Sig: Take 1 tablet by mouth every 8 (eight) hours as needed for moderate pain or severe pain.    Dispense:  50 tablet    Refill:  0    Do not place this medication, or any other prescription from our practice, on "Automatic Refill". Patient may have prescription filled one day early if pharmacy is closed on scheduled refill date. Do not fill until: 06/13/15 To last until: 07/13/15  . zolpidem (AMBIEN CR) 12.5 MG CR tablet    Sig: Take 1 tablet (12.5 mg total) by mouth at bedtime as needed for sleep.    Dispense:  20 tablet    Refill:  2    Do not place this medication, or any other prescription from our practice, on "Automatic Refill". Patient may have prescription filled one day early if pharmacy is closed on scheduled refill date.  . DULoxetine (CYMBALTA) 60 MG capsule    Sig: Take 1 capsule (60 mg total) by mouth daily.    Dispense:  30 capsule    Refill:  2    Do not place this medication, or any other prescription from our practice, on "Automatic Refill". Patient may have prescription filled one day early if pharmacy is closed on scheduled refill date.    Lab-work & Procedure Ordered: Orders Placed This Encounter  Procedures  . DG Foot 2 Views Left    Standing Status: Future     Number of Occurrences:      Standing Expiration Date: 06/14/2016    Scheduling Instructions:     Please evaluate for osteomyelitis of the left foot. The patient has a podiatrist. Please relay the results to her podiatrist.    Order Specific Question:  Reason for Exam (SYMPTOM  OR DIAGNOSIS REQUIRED)    Answer:  Left diabetic foot. Left foot pain. Left foot cellulitis.    Order Specific Question:  Preferred imaging location?    Answer:  [facility]  . Drugs of abuse screen w/o alc, rtn urine-sln    Volume: 10 ml(s). Minimum 3 ml of urine is needed. Document temperature of fresh sample. Indications: Long term (current) use of opiate analgesic (Z79.891) Test#: [id number] ([employer] Select-13)  . Comprehensive metabolic panel    Order Specific Question:  Has the patient fasted?    Answer:  No  . C-reactive protein  . Magnesium  . Sedimentation rate  . Vitamin B12    Indication: Bone Pain (M89.9)  . Vitamin D pnl(25-hydrxy+1,25-dihy)-bld  . CBC with Differential/Platelet    Imaging Ordered: DG FOOT 2 VIEWS LEFT  Interventional Therapies: Scheduled: None required this time. PRN  Procedures: None planned.    Referral(s) or Consult(s): She is pending to see her podiatrist to take care of her left foot.  Medications administered during this visit: Ms. Swilley had no medications administered during this visit.  Future Appointments Date Time Provider [place]  04/24/2015 2:15 PM [name], MD [facility]  07/10/2015 9:20 AM Milinda Pointer, MD [facility] None    Primary Care Physician: Bobetta Lime, MD Location: [facility] Facility Note by: Kathlen Brunswick. Dossie Arbour, M.D, DABA, DABAPM, DABPM, DABIPP, FIPP

## 2015-04-17 NOTE — Patient Instructions (Signed)
Instructed to go get labs drawn in Pre admit testing today.

## 2015-04-17 NOTE — Progress Notes (Signed)
Patient here for medication refill.    States she has noticed after she takes pain medication and lays down that as it begins to work she notices twitches in different areas of her body, mainly in the abdomen

## 2015-04-22 LAB — PLEASE NOTE

## 2015-04-23 DIAGNOSIS — E1149 Type 2 diabetes mellitus with other diabetic neurological complication: Secondary | ICD-10-CM | POA: Diagnosis not present

## 2015-04-23 DIAGNOSIS — B351 Tinea unguium: Secondary | ICD-10-CM | POA: Diagnosis not present

## 2015-04-23 DIAGNOSIS — D2372 Other benign neoplasm of skin of left lower limb, including hip: Secondary | ICD-10-CM | POA: Diagnosis not present

## 2015-04-24 ENCOUNTER — Ambulatory Visit (INDEPENDENT_AMBULATORY_CARE_PROVIDER_SITE_OTHER): Payer: PPO | Admitting: Cardiology

## 2015-04-24 ENCOUNTER — Encounter: Payer: Self-pay | Admitting: Cardiology

## 2015-04-24 VITALS — BP 168/90 | HR 80 | Ht 65.0 in | Wt 264.2 lb

## 2015-04-24 DIAGNOSIS — E1351 Other specified diabetes mellitus with diabetic peripheral angiopathy without gangrene: Secondary | ICD-10-CM

## 2015-04-24 DIAGNOSIS — I480 Paroxysmal atrial fibrillation: Secondary | ICD-10-CM

## 2015-04-24 DIAGNOSIS — I10 Essential (primary) hypertension: Secondary | ICD-10-CM

## 2015-04-24 DIAGNOSIS — R079 Chest pain, unspecified: Secondary | ICD-10-CM

## 2015-04-24 DIAGNOSIS — Z7901 Long term (current) use of anticoagulants: Secondary | ICD-10-CM

## 2015-04-24 HISTORY — PX: NM GATED MYOVIEW (ARMC HX): HXRAD1856

## 2015-04-24 MED ORDER — METOPROLOL TARTRATE 25 MG PO TABS
25.0000 mg | ORAL_TABLET | Freq: Two times a day (BID) | ORAL | Status: DC
Start: 2015-04-24 — End: 2015-07-11

## 2015-04-24 NOTE — Assessment & Plan Note (Signed)
On Pradaxa. No bleeding issues.

## 2015-04-24 NOTE — Progress Notes (Signed)
PCP: Bobetta Lime, MD  Clinic Note: Chief Complaint  Patient presents with  . other    1 yr f/u c/io chest pain and sob . Meds reviewed verbally with pt.  . Atrial Fibrillation    HPI: [name] is a 65 y.o. female with a PMH below who presents today for prolonged follow-up for PAF. She is on Ranexa for anticoagulation. She noted to have short bursts of A. fib with frequent PACs and PVCs noted on CardioNet monitor.  When I saw her back in 2015, will return to evaluate excessive sweating and shaking episodes. He did an echocardiogram which showed suggestion of possible mitral valve lesion that says "we was followed up by a TEE to rule out SBE. This was felt to be negative and blood cultures are negative.  She was relatively a symptomatically for A. fib.  [name] was last seen in July 2015.  Recent Hospitalizations: None  Studies Reviewed: None  Stressed out b/c her son had MI this weekend - PCI to LAD  Interval History: [name] returns today, mostly because she is scared about her son's recent MI. But she also is noticing that she is more symptomatic with the age of ablation and she had been before. She says that currently she is she is in A. fib, she notices a "a little different uneasy sensation" in her chest. Is not a chest pain or pressure, but she has noted a little bit that when she exerts herself. She doesn't notice this "uneasiness " as much when she is active or exerting herself, but has had some chest discomfort with exerting herself. Now notices irregular Heartbeats -- notes more when at rest & near end of the day.  No chest pain at rest, but does note occasional episodes with exertion. Also notes some shortness of breath with rest or exertion.  No PND, orthopnea with mild intermittent edema.  No she does occasionally feel hard pounding beats if she is anxious, and is occasionally dizzy. No generalized weakness or syncope/near syncope.  No TIA/amaurosis fugax  symptoms. No melena, hematochezia, hematuria, or epstaxis. No claudication.  ROS: A comprehensive was performed. Pertinent cardiac symptoms noted above Review of Systems  Constitutional: Negative for malaise/fatigue.  HENT: Negative for hearing loss and nosebleeds.   Respiratory: Negative for cough, shortness of breath and wheezing.   Cardiovascular: Positive for leg swelling.       Per history of present illness  Gastrointestinal: Negative for heartburn and constipation.  Musculoskeletal: Positive for joint pain. Negative for myalgias and falls.  Neurological: Positive for dizziness. Negative for headaches.  Psychiatric/Behavioral: Negative for depression. The patient is nervous/anxious. The patient does not have insomnia.   All other systems reviewed and are negative. -- she had CT scans to evaluate adrenal masses back in September. Thought to be benign  Past Medical History  Diagnosis Date  . Sinus problem   . Swelling   . Thyroid disease   . Diabetes ([place])   . Venous stasis dermatitis of both lower extremities   . Hx of blood clots   . HBP (high blood pressure)   . Kidney disease   . Depression   . Dysthymic disorder   . Paroxysmal atrial fibrillation ([place]) 2007    In June 2015, Cardiac Event Monitor: Mostly SR/sinus arrhythmia with PVCs that are frequent. Short bursts of A. fib lasting several minutes  . Asthma   . GERD (gastroesophageal reflux disease)   . Heart murmur  Echocardiogram June 2015: Mild MR (possible vegetation seen on TTE, not seen on TEE), normal LV size with moderate concentric LVH. Normal function EF 60-65%. Normal diastolic function. Mild LA dilation.  . DVT (deep venous thrombosis) ([place])   . Insomnia   . Atrial fibrillation, controlled ([place])   . Cystocele   . Neuropathy involving both lower extremities ([place])   . Anxiety and depression   . Obesity, Class II, BMI 35-39.9, with comorbidity ([place])   . Cholecystolithiasis   . Vaginitis   . Kidney  stone   . Adrenal mass ([place])   . Sleep apnea   . Reflux   . Anxiety   . Arthritis      Past Surgical History  Procedure Laterality Date  . Hernia repair    . Ankle surgery Right   . Vaginal hysterectomy    . Sleeve gastroplasty    . Lithotripsy    . Kidney surgery    . Transthoracic echocardiogram  08/03/2013    Mild-Moderate concentric LVH, EF 60-65%. Normal diastolic function. Mild LA dilation. Mild MR with possible vegetation  - not confirmed on TEE   . Transesophageal echocardiogram  08/08/2013    Mild LVH, EF 60-65%. Moderate LA dilation and mild RA dilation. Mild MR with no evidence of stenosis and no evidence of endocarditis. A false chordae is noted.  . Tonsillectomy    . Total knee arthroplasty    . Hiatal hernia repair    . Joint replacement      left knee replacement    Prior to Admission medications   Medication Sig Start Date End Date Taking? Authorizing Provider  albuterol (PROVENTIL HFA;VENTOLIN HFA) 108 (90 Base) MCG/ACT inhaler Inhale 1-2 puffs into the lungs every 4 (four) hours as needed for wheezing or shortness of breath. 03/29/15  Yes Bobetta Lime, MD  albuterol (PROVENTIL) (2.5 MG/3ML) 0.083% nebulizer solution Take 3 mLs (2.5 mg total) by nebulization every 6 (six) hours as needed for wheezing or shortness of breath. 02/21/15  Yes Ashok Norris, MD  ALPRAZolam Duanne Moron) 0.5 MG tablet TAKE ONE TABLET TWICE DAILY AS NEEDED 11/08/14  Yes Bobetta Lime, MD  buPROPion (WELLBUTRIN XL) 300 MG 24 hr tablet TAKE ONE TABLET BY MOUTH EVERY DAY 03/29/15  Yes Bobetta Lime, MD  DULoxetine (CYMBALTA) 60 MG capsule Take 1 capsule (60 mg total) by mouth daily. 04/17/15  Yes Milinda Pointer, MD  ESTRACE VAGINAL 0.1 MG/GM vaginal cream INSERT 1 GRAM VAGINALLY 2 TIMES A WEEK 03/21/15  Yes Alanda Slim Defrancesco, MD  gabapentin (NEURONTIN) 800 MG tablet Take 1 tablet (800 mg total) by mouth every 6 (six) hours. 04/17/15  Yes Milinda Pointer, MD  HYDROcodone-acetaminophen  (NORCO/VICODIN) 5-325 MG tablet Take 1 tablet by mouth every 8 (eight) hours as needed for moderate pain or severe pain. 04/17/15  Yes Milinda Pointer, MD  levothyroxine (SYNTHROID, LEVOTHROID) 150 MCG tablet TAKE ONE TABLET EVERY MORNING 01/07/15  Yes Bobetta Lime, MD  Melatonin 10 MG CAPS Take 20 mg by mouth at bedtime as needed. 04/17/15  Yes Milinda Pointer, MD  metFORMIN (GLUCOPHAGE) 1000 MG tablet TAKE ONE TABLET TWICE DAILY Patient taking differently: 1000 mg daily every morning. 03/06/15  Yes Bobetta Lime, MD  metFORMIN (GLUCOPHAGE) 500 MG tablet Take 500 mg by mouth at bedtime.   Yes Historical Provider, MD  mirabegron ER (MYRBETRIQ) 25 MG TB24 tablet Take 1 tablet (25 mg total) by mouth daily. 11/09/14  Yes Shannon A McGowan, PA-C  montelukast (SINGULAIR) 10 MG tablet TAKE  ONE TABLET EVERY DAY 08/24/14  Yes Bobetta Lime, MD  pantoprazole (PROTONIX) 40 MG tablet Take 1 tablet (40 mg total) by mouth daily. 08/31/14  Yes Bobetta Lime, MD  PRADAXA 150 MG CAPS capsule TAKE 1 CAPSULE BY MOUTH TWICE DAILY 01/07/15  Yes Bobetta Lime, MD  quinapril (ACCUPRIL) 20 MG tablet TAKE ONE TABLET BY MOUTH EVERY DAY 02/11/15  Yes Bobetta Lime, MD  zolpidem (AMBIEN CR) 12.5 MG CR tablet Take 1 tablet (12.5 mg total) by mouth at bedtime as needed for sleep. 04/17/15  Yes Milinda Pointer, MD    Allergies  Allergen Reactions  . Aspirin Hives  . Penicillins Hives  . Terramycin [Oxytetracycline] Hives    Social History   Social History  . Marital Status: Married    Spouse Name: N/A  . Number of Children: N/A  . Years of Education: N/A   Social History Main Topics  . Smoking status: Never Smoker   . Smokeless tobacco: Never Used  . Alcohol Use: No  . Drug Use: No  . Sexual Activity: No   Other Topics Concern  . None   Social History Narrative   She is currently married -- for 28 years. Does not work. Does not smoke or take alcohol. She never smoked. She exercises at least 3  days a week since before her gastric surgery.   Marital status reviewed in history of present illness.   Family History  Problem Relation Age of Onset  . Skin cancer Father   . Diabetes Father   . Hypertension Father   . Peripheral vascular disease Father   . Cancer Father   . Cerebral aneurysm Father   . Varicose Veins Mother   . Kidney disease Mother   . Mental illness Sister   . Cancer Maternal Aunt     breast  . Breast cancer Maternal Aunt     Wt Readings from Last 3 Encounters:  04/24/15 264 lb 4 oz (119.863 kg)  04/17/15 242 lb (109.77 kg)  03/29/15 270 lb (122.471 kg)    PHYSICAL EXAM BP 168/90 mmHg  Pulse 80  Ht 5\' 5"  (1.651 m)  Wt 264 lb 4 oz (119.863 kg)  BMI 43.97 kg/m2 General appearance: alert, cooperative, appears stated age, no distress, moderately obese and A pleasant mood with normal affect. Answers questions appropriately. Neck: no adenopathy, no carotid bruit, no JVD and supple, symmetrical, trachea midline Lungs: clear to auscultation bilaterally, normal percussion bilaterally and Nonlabored, good air movement Heart: Irregularly irregular. Normal rate. Distant, but mostly normal S1 and S2. No notable murmur heard. No rubs or gallops heard. Unable to palpate PMI due to body habitus. Abdomen: Obese but otherwise soft/NT/M.D./NA BS. No HSM palpable due to obesity there Extremities: She actually does not have any edema but had bilateral venous stasis changes with multiple little bandages from recent infections. Pulses: 2+ and symmetric Neurologic: Alert and oriented X 3, normal strength and tone. Normal symmetric reflexes. Normal coordination and gait    Adult ECG Report  Rate: 80 ;  Rhythm: atrial fibrillation and Otherwise normal intervals, durations and voltage.;   Narrative Interpretation: Atrial fibrillation is replaced sinus rhythm   Other studies Reviewed: Additional studies/ records that were reviewed today include:  Recent Labs:  Checked by  PCP Lab Results  Component Value Date   CHOL 185 10/10/2014   HDL 94 10/10/2014   LDLCALC 73 10/10/2014   TRIG 89 10/10/2014   CHOLHDL 2.0 10/10/2014    ASSESSMENT / PLAN: Problem  List Items Addressed This Visit    Peripheral vascular disease due to secondary diabetes mellitus ([place]) (Chronic)   Relevant Medications   metoprolol tartrate (LOPRESSOR) 25 MG tablet   Paroxysmal atrial fibrillation ([facility]) (Chronic)    Currently in A. fib and is noticing symptoms. More noticeable now than before. Rate seems well controlled, but she does notice the uneasiness now. At this point I think with her having some chest discomfort, we need to investigate for possible ischemic etiology with a Myoview stress test. This will allow [place] to determine a more appropriate treatment plan including potential antiarrhythmics. For now I will add metoprolol 25 mg twice a day. If her Myoview is negative, would consider flecainide with initial thought of it being a pill in the pocket medication.  If her symptoms persist, we may talk about cardioversion.      Relevant Medications   metoprolol tartrate (LOPRESSOR) 25 MG tablet   Other Relevant Orders   EKG 12-Lead   Hypertension goal BP (blood pressure) < 150/90 - Primary    Blood pressures, high today. She thinks that she it may be considered she is anxious about her son, but I think that gives [place] room to consider beta blocker. Will start metoprolol 25 mg twice a day along with ACE inhibitor.      Relevant Medications   metoprolol tartrate (LOPRESSOR) 25 MG tablet   Chronic anticoagulation  (Chronic)    On Pradaxa. No bleeding issues.      Chest pain with moderate risk for cardiac etiology (Chronic)    She didn't initially come out until maybe about this chest discomfort, however when I asked her more detail she does notice having some chest tightness spells. Didn't get a sense as to always be associated with exertion, but she did mention it as part of  instrument and a question. With now what seems like more frequent episodes of A. fib, need to exclude ischemic etiology.  Plan: We'll attempt Treadmill Myoview, but low threshold to switch to [employer]      Relevant Orders   NM Myocar Multi W/Spect W/Wall Motion / EF      Current medicines are reviewed at length with the patient today. (+/- concerns) is having some swelling. Was wondering about a fluid pill The following changes have been made:   Start metoprolol 25 mg twice a day  Studies Ordered:   Orders Placed This Encounter  Procedures  . NM Myocar Multi W/Spect W/Wall Motion / EF  . EKG 12-Lead   ROV ~2 months   Zeth Buday, Leonie Green, M.D., M.S. Interventional Cardiologist   Pager # [phone] Phone # [phone] [address] [place], [place] [address]

## 2015-04-24 NOTE — Assessment & Plan Note (Signed)
She didn't initially come out until maybe about this chest discomfort, however when I asked her more detail she does notice having some chest tightness spells. Didn't get a sense as to always be associated with exertion, but she did mention it as part of instrument and a question. With now what seems like more frequent episodes of A. fib, need to exclude ischemic etiology.  Plan: We'll attempt Treadmill Myoview, but low threshold to switch to [employer]

## 2015-04-24 NOTE — Assessment & Plan Note (Addendum)
Currently in A. fib and is noticing symptoms. More noticeable now than before. Rate seems well controlled, but she does notice the uneasiness now. At this point I think with her having some chest discomfort, we need to investigate for possible ischemic etiology with a Myoview stress test. This will allow [place] to determine a more appropriate treatment plan including potential antiarrhythmics. For now I will add metoprolol 25 mg twice a day. If her Myoview is negative, would consider flecainide with initial thought of it being a pill in the pocket medication.  If her symptoms persist, we may talk about cardioversion.

## 2015-04-24 NOTE — Patient Instructions (Addendum)
.Medication Instructions:  Your physician has recommended you make the following change in your medication:  START taking metoprolol 25mg  twice daily   Labwork: none  Testing/Procedures: Your physician has requested that you have a lexiscan myoview. For further information please visit [email or web address]. Please follow instruction sheet, as given.  [place]  Your caregiver has ordered a Stress Test with nuclear imaging. The purpose of this test is to evaluate the blood supply to your heart muscle. This procedure is referred to as a "Non-Invasive Stress Test." This is because other than having an IV started in your vein, nothing is inserted or "invades" your body. Cardiac stress tests are done to find areas of poor blood flow to the heart by determining the extent of coronary artery disease (CAD). Some patients exercise on a treadmill, which naturally increases the blood flow to your heart, while others who are  unable to walk on a treadmill due to physical limitations have a pharmacologic/chemical stress agent called Lexiscan . This medicine will mimic walking on a treadmill by temporarily increasing your coronary blood flow.   Please note: these test may take anywhere between 2-4 hours to complete  PLEASE REPORT TO [place] AT THE FIRST DESK WILL DIRECT YOU WHERE TO GO  Date of Procedure:____Wednesday, Feb 8__________________  Arrival Time for Procedure:_______7:15am__________________  Instructions regarding medication:   __xx__ : Hold diabetes medication morning of procedure  _xx___:  Hold metoprolol the night before procedure and morning of procedure   PLEASE NOTIFY THE OFFICE AT LEAST 24 HOURS IN ADVANCE IF YOU ARE UNABLE TO KEEP YOUR APPOINTMENT.  [phone] AND  PLEASE NOTIFY NUCLEAR MEDICINE AT [facility] AT LEAST 24 HOURS IN ADVANCE IF YOU ARE UNABLE TO KEEP YOUR APPOINTMENT. [phone]  How to prepare for your Myoview test:   Do not  eat or drink after midnight  No caffeine for 24 hours prior to test  No smoking 24 hours prior to test.  Your medication may be taken with water.  If your doctor stopped a medication because of this test, do not take that medication.  Ladies, please do not wear dresses.  Skirts or pants are appropriate. Please wear a short sleeve shirt.  No perfume, cologne or lotion.  Wear comfortable walking shoes. No heels!            Follow-Up: Your physician recommends that you schedule a follow-up appointment in: 2 months with Dr. [name].    Any Other Special Instructions Will Be Listed Below (If Applicable).     If you need a refill on your cardiac medications before your next appointment, please call your pharmacy.  Cardiac Nuclear Scanning A cardiac nuclear scan is used to check your heart for problems, such as the following:  A portion of the heart is not getting enough blood.  Part of the heart muscle has died, which happens with a heart attack.  The heart wall is not working normally.  In this test, a radioactive dye (tracer) is injected into your bloodstream. After the tracer has traveled to your heart, a scanning device is used to measure how much of the tracer is absorbed by or distributed to various areas of your heart. LET [facility] CARE PROVIDER KNOW ABOUT:  Any allergies you have.  All medicines you are taking, including vitamins, herbs, eye drops, creams, and over-the-counter medicines.  Previous problems you or members of your family have had with the use of anesthetics.  Any blood disorders you  have.  Previous surgeries you have had.  Medical conditions you have.  RISKS AND COMPLICATIONS Generally, this is a safe procedure. However, as with any procedure, problems can occur. Possible problems include:   Serious chest pain.  Rapid heartbeat.  Sensation of warmth in your chest. This usually passes quickly. BEFORE THE PROCEDURE Ask your health  care provider about changing or stopping your regular medicines. PROCEDURE This procedure is usually done at a hospital and takes 2-4 hours.  An IV tube is inserted into one of your veins.  Your health care provider will inject a small amount of radioactive tracer through the tube.  You will then wait for 20-40 minutes while the tracer travels through your bloodstream.  You will lie down on an exam table so images of your heart can be taken. Images will be taken for about 15-20 minutes.  You will exercise on a treadmill or stationary bike. While you exercise, your heart activity will be monitored with an electrocardiogram (ECG), and your blood pressure will be checked.  If you are unable to exercise, you may be given a medicine to make your heart beat faster.  When blood flow to your heart has peaked, tracer will again be injected through the IV tube.  After 20-40 minutes, you will get back on the exam table and have more images taken of your heart.  When the procedure is over, your IV tube will be removed. AFTER THE PROCEDURE  You will likely be able to leave shortly after the test. Unless your health care provider tells you otherwise, you may return to your normal schedule, including diet, activities, and medicines.  Make sure you find out how and when you will get your test results.   This information is not intended to replace advice given to you by your health care provider. Make sure you discuss any questions you have with your health care provider.   Document Released: 04/03/2004 Document Revised: 03/14/2013 Document Reviewed: 02/15/2013 Elsevier Interactive Patient Education [employer].

## 2015-04-24 NOTE — Assessment & Plan Note (Signed)
Blood pressures, high today. She thinks that she it may be considered she is anxious about her son, but I think that gives [place] room to consider beta blocker. Will start metoprolol 25 mg twice a day along with ACE inhibitor.

## 2015-04-26 ENCOUNTER — Other Ambulatory Visit: Payer: Self-pay | Admitting: Family Medicine

## 2015-04-27 LAB — TOXASSURE SELECT 13 (MW), URINE: PDF: 0

## 2015-04-27 IMAGING — MG MM DIGITAL SCREENING BILAT W/ CAD
1 series · 7 of 7 positions shown · non-contrast
Comparison: Previous exam(s).

CLINICAL DATA: Screening.

EXAM:
DIGITAL SCREENING BILATERAL MAMMOGRAM WITH CAD

[R CC · right · 7 of 7 slices shown]
[im 1/7]
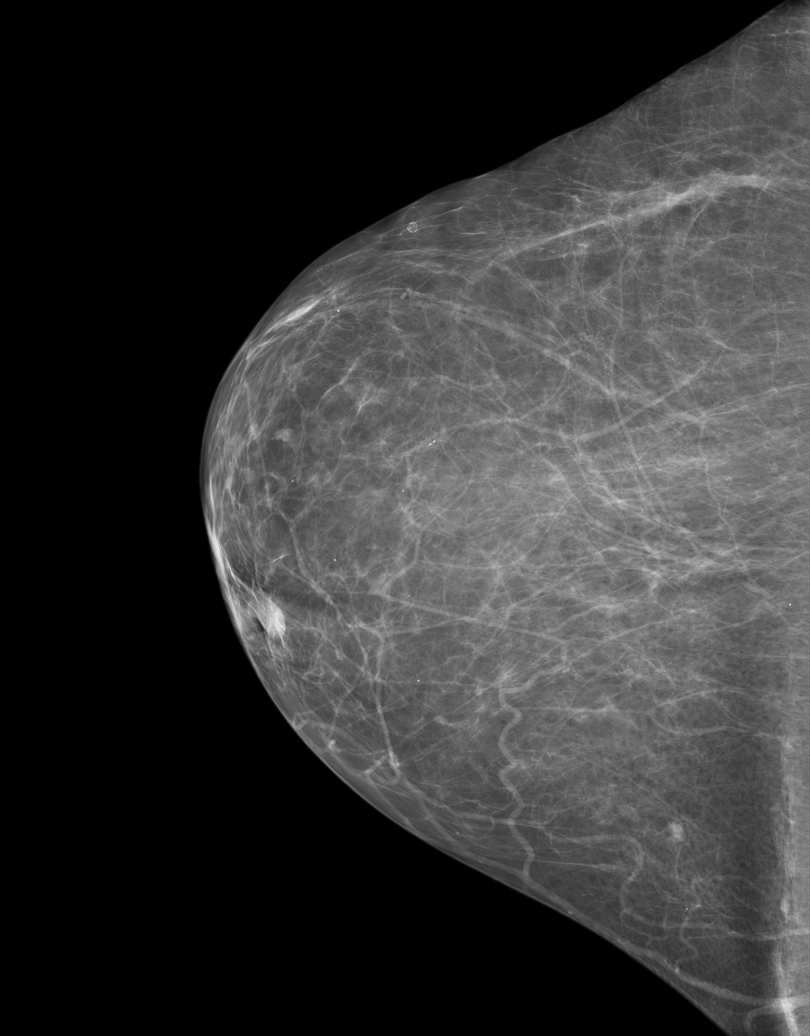
[im 2/7]
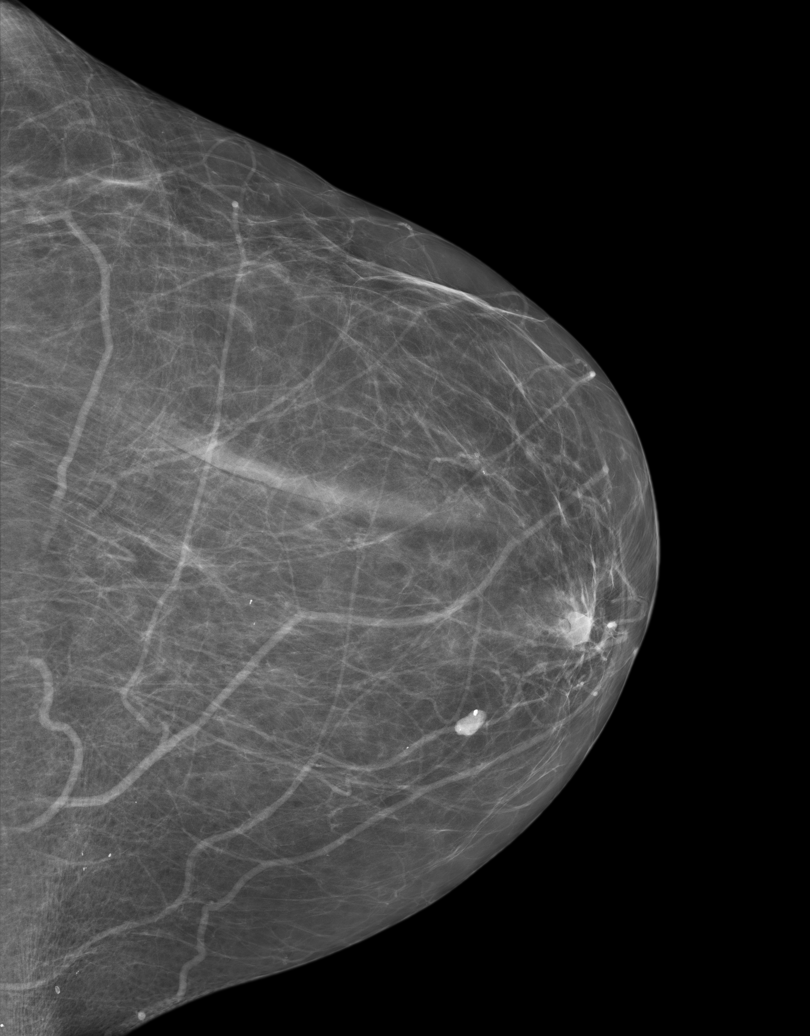
[im 3/7]
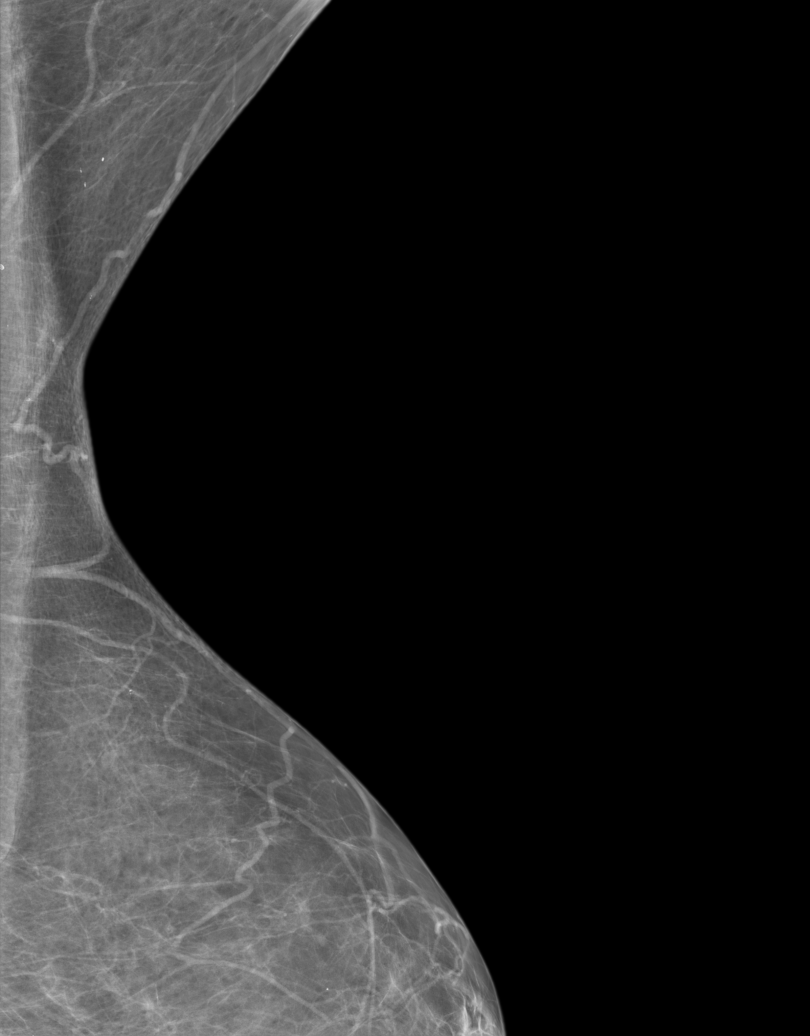
[im 4/7]
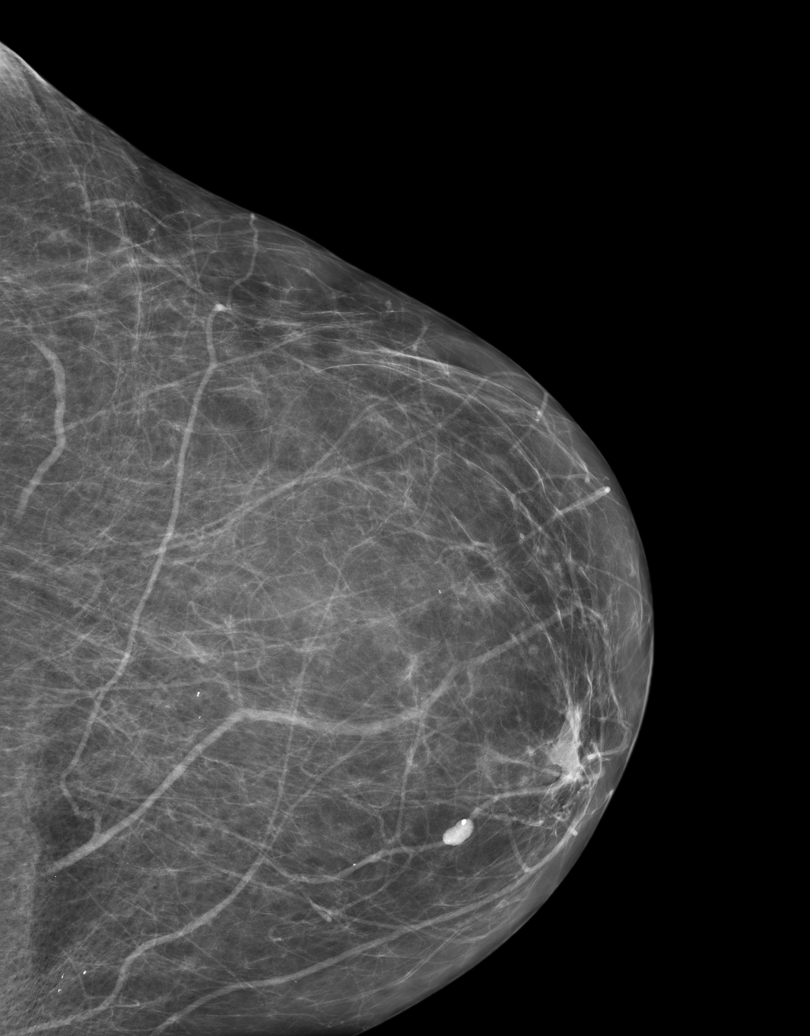
[im 5/7]
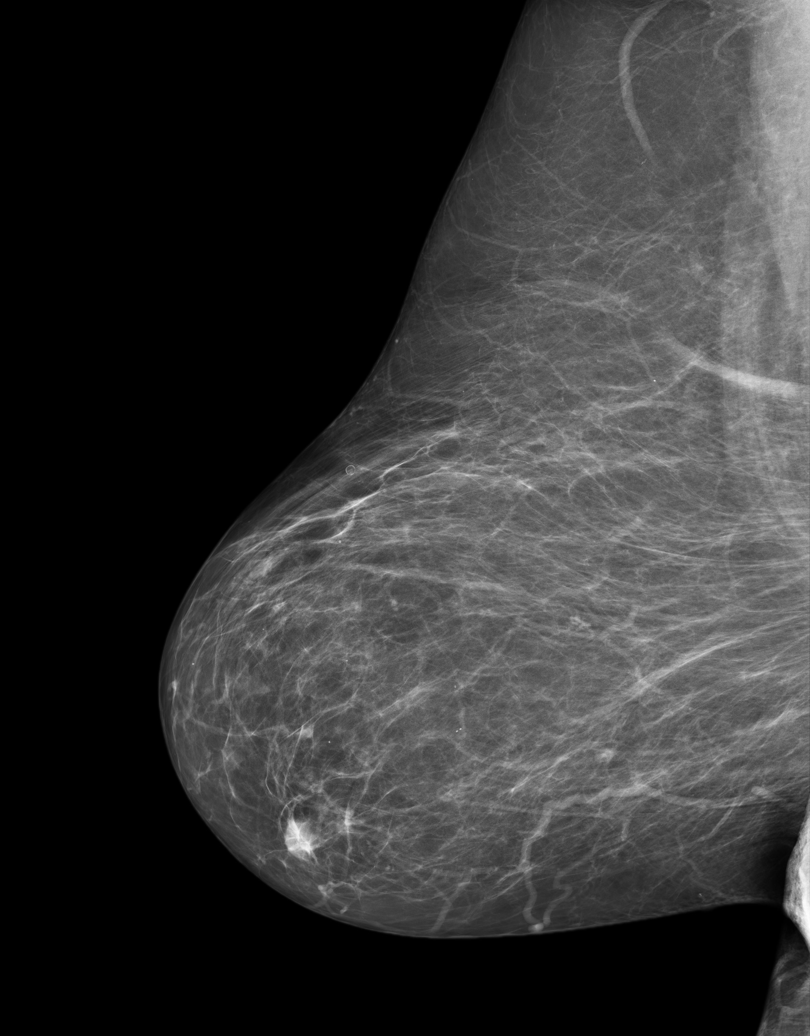
[im 6/7]
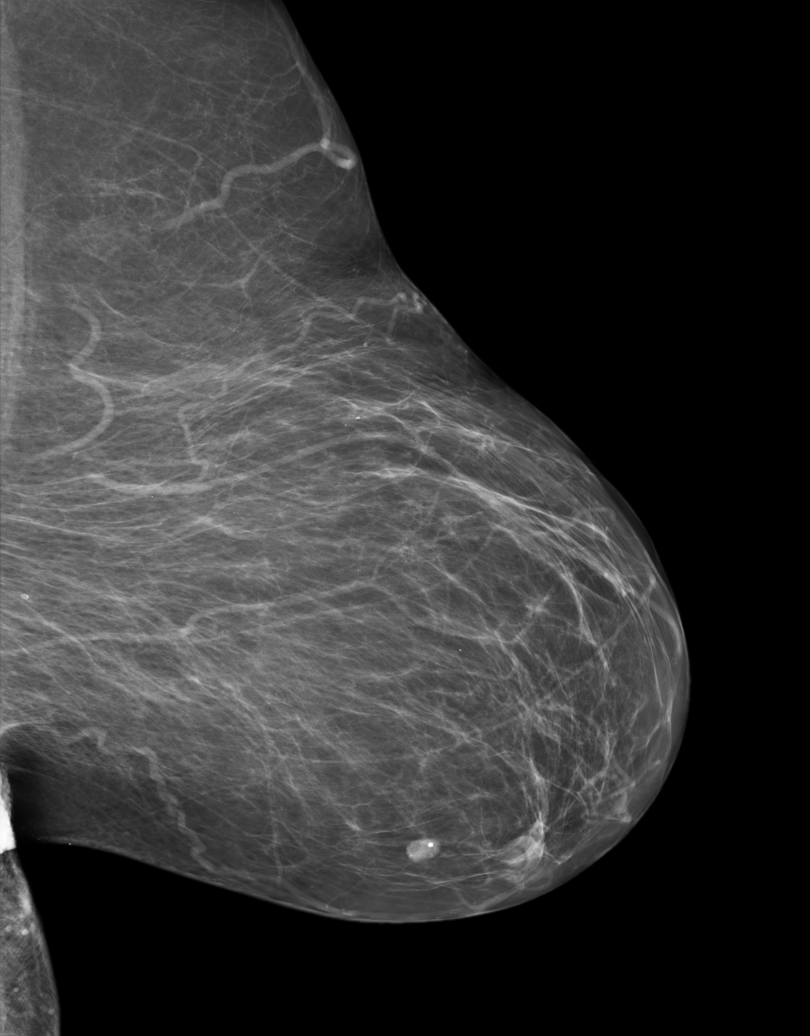
[im 7/7]
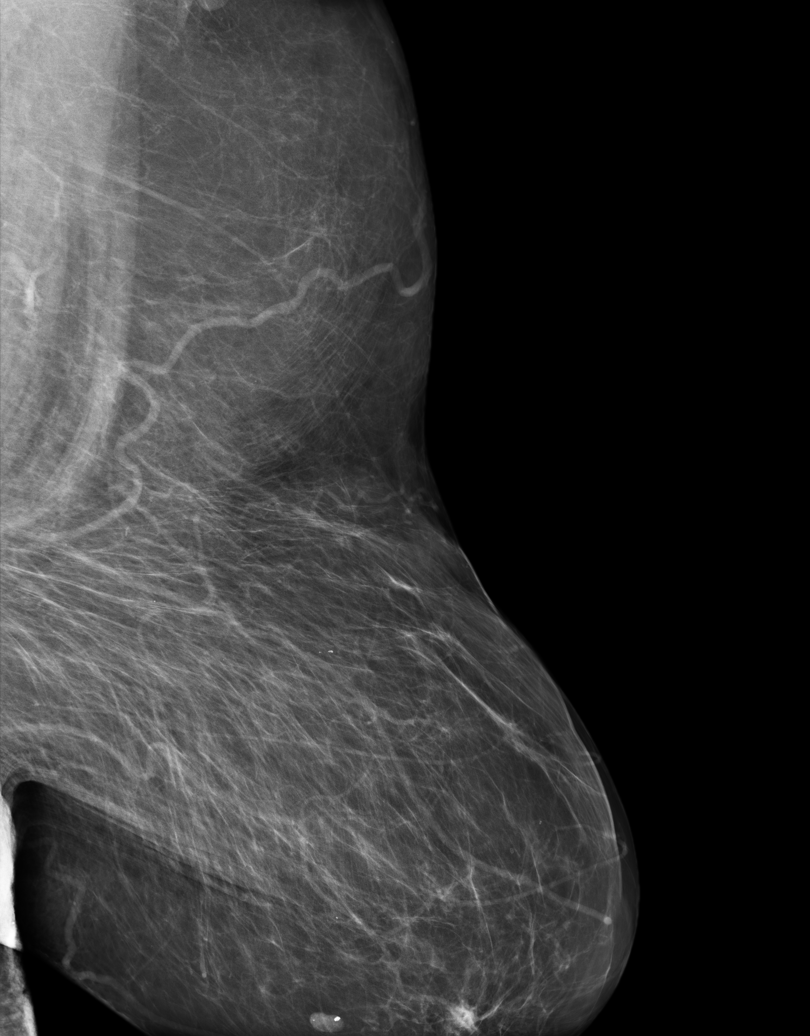

[7 of 7 positions shown; findings below may reference images not displayed]

ACR Breast Density Category b: There are scattered areas of
fibroglandular density.
FINDINGS: There are no findings suspicious for malignancy. Images were
processed with CAD.
IMPRESSION: No mammographic evidence of malignancy. A result letter of this
screening mammogram will be mailed directly to the patient.

RECOMMENDATION:
Screening mammogram in one year. (Code:GW-[id number])

BI-RADS CATEGORY  1: Negative

## 2015-04-30 ENCOUNTER — Telehealth: Payer: Self-pay

## 2015-04-30 NOTE — Telephone Encounter (Signed)
Pt called back to confirm [facility] Feb 8. Reviewed arrival time, meds and instructions. Pt verbalized understanding.

## 2015-04-30 NOTE — Telephone Encounter (Signed)
Left message on pt home and cell VM regarding treadmill myoview

## 2015-05-01 ENCOUNTER — Encounter
Admission: RE | Admit: 2015-05-01 | Discharge: 2015-05-01 | Disposition: A | Discharge status: Home / Self Care | Payer: PPO | Source: Ambulatory Visit | Attending: Cardiology | Admitting: Cardiology

## 2015-05-01 DIAGNOSIS — R079 Chest pain, unspecified: Secondary | ICD-10-CM | POA: Diagnosis not present

## 2015-05-01 MED ORDER — REGADENOSON 0.4 MG/5ML IV SOLN
0.4000 mg | Freq: Once | INTRAVENOUS | Status: AC
  Administered 2015-05-01: 0.4 mg via INTRAVENOUS

## 2015-05-01 MED ORDER — TECHNETIUM TC 99M SESTAMIBI - CARDIOLITE
31.6300 | Freq: Once | INTRAVENOUS | Status: AC | PRN
  Administered 2015-05-01: 09:00:00 31.63 via INTRAVENOUS

## 2015-05-01 MED ORDER — TECHNETIUM TC 99M SESTAMIBI - CARDIOLITE
14.0100 | Freq: Once | INTRAVENOUS | Status: AC | PRN
  Administered 2015-05-01: 08:00:00 14.01 via INTRAVENOUS

## 2015-05-02 LAB — NM MYOCAR MULTI W/SPECT W/WALL MOTION / EF
CHL CUP NUCLEAR SSS: 2
CHL CUP STRESS STAGE 2 GRADE: 0 %
CHL CUP STRESS STAGE 2 SPEED: 0 mph
CHL CUP STRESS STAGE 3 GRADE: 0 %
CHL CUP STRESS STAGE 3 SPEED: 0 mph
CHL CUP STRESS STAGE 4 GRADE: 0 %
CHL CUP STRESS STAGE 4 HR: 86 {beats}/min
CHL CUP STRESS STAGE 4 SPEED: 0 mph
CHL CUP STRESS STAGE 6 GRADE: 0 %
CHL CUP STRESS STAGE 6 HR: 77 {beats}/min
CHL CUP STRESS STAGE 6 SBP: 168 mmHg
CHL CUP STRESS STAGE 6 SPEED: 0 mph
CSEPPHR: 86 {beats}/min
CSEPPMHR: 55 %
Estimated workload: 1 METS
Exercise duration (min): 0 min
Exercise duration (sec): 0 s
LVDIAVOL: 85 mL
LVSYSVOL: 25 mL
MPHR: 155 {beats}/min
NUC STRESS TID: 1.08
Percent HR: 60 %
Rest HR: 66 {beats}/min
SDS: 0
SRS: 10
Stage 2 HR: 66 {beats}/min
Stage 3 HR: 66 {beats}/min
Stage 5 Grade: 0 %
Stage 5 HR: 83 {beats}/min
Stage 5 Speed: 0 mph
Stage 6 DBP: 61 mmHg

## 2015-05-02 NOTE — Progress Notes (Signed)
Quick Note:  Stress Test looked good!! No sign of significant Heart Artery Disease. Pump function is normal. There was some mild artifact that is related to breast tissue, but no evidence of reduced blood flow with rest or stress.  Good news!!.  [name], MD  ______

## 2015-05-04 NOTE — Progress Notes (Signed)
Quick Note:   Normal fasting (NPO x 8 hours) glucose levels are between 65-99 mg/dl, with 2 hour fasting, levels are usually less than 140 mg/dl. Any random blood glucose level greater than 200 mg/dl is considered to be Diabetes.  BUN levels between 7 to 20 mg/dL (2.5 to 7.1 mmol/L) are considered normal. Elevated blood urea nitrogen can also be due to: urinary tract obstruction; congestive heart failure or recent heart attack; gastrointestinal bleeding; dehydration; shock; severe burns; certain medications, such as corticosteroids and some antibiotics; and/or a high protein diet.  BUN-to-creatinine ratio >20:1 (BUN dispropertionally higher than the creatinine levels) suggests prerenal azotemia (dehydration or renal hypoperfusion), while <10:1 levels suggest renal damage.  ______ 

## 2015-05-04 NOTE — Progress Notes (Signed)
Quick Note:  Mildly abnormal. No significance. ______

## 2015-05-04 NOTE — Progress Notes (Signed)
Quick Note:  Lab results reviewed and found to be within normal limits. ______ 

## 2015-05-06 ENCOUNTER — Encounter: Payer: Self-pay | Admitting: Family Medicine

## 2015-05-22 ENCOUNTER — Ambulatory Visit
Admission: RE | Admit: 2015-05-22 | Discharge: 2015-05-22 | Disposition: A | Discharge status: Home / Self Care | Payer: PPO | Source: Ambulatory Visit | Attending: Family Medicine | Admitting: Family Medicine

## 2015-05-22 ENCOUNTER — Ambulatory Visit (INDEPENDENT_AMBULATORY_CARE_PROVIDER_SITE_OTHER): Payer: PPO | Admitting: Family Medicine

## 2015-05-22 ENCOUNTER — Encounter: Payer: Self-pay | Admitting: Family Medicine

## 2015-05-22 VITALS — BP 126/84 | HR 64 | Temp 97.8°F | Resp 14 | Wt 268.0 lb

## 2015-05-22 DIAGNOSIS — X58XXXA Exposure to other specified factors, initial encounter: Secondary | ICD-10-CM | POA: Diagnosis not present

## 2015-05-22 DIAGNOSIS — Z96652 Presence of left artificial knee joint: Secondary | ICD-10-CM | POA: Diagnosis not present

## 2015-05-22 DIAGNOSIS — S0992XA Unspecified injury of nose, initial encounter: Secondary | ICD-10-CM | POA: Insufficient documentation

## 2015-05-22 DIAGNOSIS — L03115 Cellulitis of right lower limb: Secondary | ICD-10-CM

## 2015-05-22 DIAGNOSIS — S0083XA Contusion of other part of head, initial encounter: Secondary | ICD-10-CM | POA: Diagnosis not present

## 2015-05-22 DIAGNOSIS — M79662 Pain in left lower leg: Secondary | ICD-10-CM | POA: Insufficient documentation

## 2015-05-22 DIAGNOSIS — S8991XA Unspecified injury of right lower leg, initial encounter: Secondary | ICD-10-CM | POA: Diagnosis not present

## 2015-05-22 DIAGNOSIS — M25561 Pain in right knee: Secondary | ICD-10-CM

## 2015-05-22 DIAGNOSIS — M7989 Other specified soft tissue disorders: Secondary | ICD-10-CM | POA: Diagnosis not present

## 2015-05-22 DIAGNOSIS — W19XXXA Unspecified fall, initial encounter: Secondary | ICD-10-CM | POA: Diagnosis not present

## 2015-05-22 DIAGNOSIS — M898X6 Other specified disorders of bone, lower leg: Secondary | ICD-10-CM | POA: Insufficient documentation

## 2015-05-22 DIAGNOSIS — S0993XA Unspecified injury of face, initial encounter: Secondary | ICD-10-CM | POA: Diagnosis not present

## 2015-05-22 DIAGNOSIS — M8580 Other specified disorders of bone density and structure, unspecified site: Secondary | ICD-10-CM | POA: Diagnosis not present

## 2015-05-22 DIAGNOSIS — M1711 Unilateral primary osteoarthritis, right knee: Secondary | ICD-10-CM | POA: Insufficient documentation

## 2015-05-22 DIAGNOSIS — S8992XA Unspecified injury of left lower leg, initial encounter: Secondary | ICD-10-CM | POA: Diagnosis not present

## 2015-05-22 MED ORDER — HYDROCODONE-ACETAMINOPHEN 10-325 MG PO TABS: 1.0000 | ORAL_TABLET | Freq: Three times a day (TID) | ORAL | Status: DC | PRN

## 2015-05-22 MED ORDER — CEPHALEXIN 500 MG PO CAPS: 500.0000 mg | ORAL_CAPSULE | Freq: Four times a day (QID) | ORAL | Status: DC

## 2015-05-23 NOTE — Progress Notes (Signed)
Name: SHARNAE WINFREE   MRN: [id number]    DOB: 16-Dec-1950   Date:05/23/2015       Progress Note  Subjective  Chief Complaint  Chief Complaint  Patient presents with  . Cellulitis    right leg ongoing (maybe ulcer)  . Cyst    knot on left leg cleaning house was picking up chair and fell and hit leg.  Bruised with knot  . Fall    Today in lobby fell on right hand, now having  right shoulder pain, knee pain and also hit nose which is turning purple already. Patient currently using ice packs    HPI  Mrs Nefertari Rebman is a pleasant 65 year old female who is here today with follow up of recurrent cellulitis of her lower extremities, leg and foot ulcers, and new traumatic skin lesions.   She has recurrent falls, not associated with dizziness but poor balance. Determined to be due to peripheral neuropathy, joint pain, central obesity with thin lower extremities. She was picking up and moving chairs in her home about 4 days ago and she nearly had another fall but the chair came down on her left anterior shin area leaving a large bruise and swelling which has come down in size. Then as she was walking into our clinic waiting room she had another accidental fall. [place] forward onto right knee and face. Her nose is bruised, hurts, right knee hurts.   She has a current relevant medical diagnosis of DM II reasonably controlled for age with peripheral neuropathy and circulatory dysfunction, CKD stage II, Hypertension, venous statis dermatitis, history of LE ulcers with reoccurance, Hypothyroidism, chronic propensity for cellulitis of lower extremities. Has has health team advantage comprehensive analysis early Nov 2016 including Echo, ABI, blood work which should be scanned into her chart. ABI was normal I believe.   She is checking her feet daily and in the past has seen podiatry specialist as well at [facility], previously also seen by Dr. Lucky Cowboy at [facility]. She notes dryness to her feet, abrasions,  early stages on ulcers, crooked toes and flat feet and reduced sensation in her feet.This is her baseline.  Past Medical History  Diagnosis Date  . Sinus problem   . Swelling   . Thyroid disease   . Diabetes ([place])   . Venous stasis dermatitis of both lower extremities   . Hx of blood clots   . HBP (high blood pressure)   . Kidney disease   . Depression   . Dysthymic disorder   . Paroxysmal atrial fibrillation ([place]) 2007    In June 2015, Cardiac Event Monitor: Mostly SR/sinus arrhythmia with PVCs that are frequent. Short bursts of A. fib lasting several minutes  . Asthma   . GERD (gastroesophageal reflux disease)   . Heart murmur     Echocardiogram June 2015: Mild MR (possible vegetation seen on TTE, not seen on TEE), normal LV size with moderate concentric LVH. Normal function EF 60-65%. Normal diastolic function. Mild LA dilation.  . DVT (deep venous thrombosis) ([place])   . Insomnia   . Atrial fibrillation, controlled ([place])   . Cystocele   . Neuropathy involving both lower extremities ([place])   . Anxiety and depression   . Obesity, Class II, BMI 35-39.9, with comorbidity ([place])   . Cholecystolithiasis   . Vaginitis   . Kidney stone   . Adrenal mass ([place])   . Sleep apnea   . Reflux   . Anxiety   .  Arthritis     Patient Active Problem List   Diagnosis Date Noted  . Accidental fall 05/22/2015  . Right anterior knee pain 05/22/2015  . Pain in tibia 05/22/2015  . Injury of nose 05/22/2015  . Blunt trauma of nose 05/22/2015  . Chronic anticoagulation  04/24/2015  . Chest pain with moderate risk for cardiac etiology 04/24/2015  . Insomnia secondary to chronic pain 04/17/2015  . Edema of left foot 04/17/2015  . Diabetic foot ([place]) (Left) 04/17/2015  . Antibiotic-induced yeast infection 03/29/2015  . Abrasion of right lower extremity 03/26/2015  . Chronic pain 01/16/2015  . Long term current use of opiate analgesic 01/16/2015  . Long term prescription opiate use 01/16/2015   . Opiate use 01/16/2015  . Encounter for therapeutic drug level monitoring 01/16/2015  . Lateral femoral cutaneous entrapment syndrome 01/16/2015  . Foot pain (Bilateral) 01/16/2015  . Chronic shoulder pain (Right) 01/16/2015  . Diabetic peripheral neuropathy ([place]) 01/16/2015  . Ulcer of toe of left foot ([place]) 12/21/2014  . Atherosclerotic PVD with ulceration ([place]) 12/17/2014  . Edema of right lower extremity 12/05/2014  . Mixed incontinence 11/26/2014  . Atrophic vaginitis 11/26/2014  . Myelolipoma of right adrenal gland 11/26/2014  . Comprehensive diabetic foot examination, type 2 DM, encounter for ([place]) 11/01/2014  . Marital problems 10/25/2014  . Major depression, recurrent, chronic ([place]) 10/24/2014  . Anxiety and depression 09/21/2014  . Chronic kidney disease 09/21/2014  . Allergic contact dermatitis 09/21/2014  . Bladder cystocele 09/21/2014  . Gastro-esophageal reflux disease without esophagitis 09/21/2014  . History of cellulitis 09/21/2014  . H/O deep venous thrombosis 09/21/2014  . Personal history of surgery to heart and great vessels, presenting hazards to health 09/21/2014  . H/O fall 09/21/2014  . Cannot sleep 09/21/2014  . Neuropathy involving both lower extremities ([place]) 09/21/2014  . Extreme obesity ([place]) 09/21/2014  . Hernia, rectovaginal 09/21/2014  . Vascular disorder of lower extremity 09/21/2014  . Candidal dermatitis 09/21/2014  . Dryness of vagina 09/21/2014  . Ulcer of lower extremity ([place]) 09/21/2014  . DM (diabetes mellitus) type II controlled peripheral vascular disorder ([place]) 08/31/2014  . Diabetic peripheral neuropathy associated with type 2 diabetes mellitus ([place]) 08/31/2014  . Asthma, moderate persistent, well-controlled 08/31/2014  . Hypothyroidism, adult 08/31/2014  . Peripheral vascular disease due to secondary diabetes mellitus ([place]) 12/11/2013  . Apnea, sleep 10/19/2013  . Venous stasis dermatitis of both lower extremities 10/06/2013  .  Paroxysmal atrial fibrillation ([place]) 07/27/2013  . Hypertension goal BP (blood pressure) < 150/90 07/27/2013  . Hyperhidrosis - unclear etiology 07/27/2013    Social History  Substance Use Topics  . Smoking status: Never Smoker   . Smokeless tobacco: Never Used  . Alcohol Use: No     Current outpatient prescriptions:  .  albuterol (PROVENTIL HFA;VENTOLIN HFA) 108 (90 Base) MCG/ACT inhaler, Inhale 1-2 puffs into the lungs every 4 (four) hours as needed for wheezing or shortness of breath., Disp: 8 g, Rfl: 2 .  albuterol (PROVENTIL) (2.5 MG/3ML) 0.083% nebulizer solution, Take 3 mLs (2.5 mg total) by nebulization every 6 (six) hours as needed for wheezing or shortness of breath., Disp: 150 mL, Rfl: 1 .  ALPRAZolam (XANAX) 0.5 MG tablet, TAKE ONE TABLET TWICE DAILY AS NEEDED, Disp: 60 tablet, Rfl: 5 .  buPROPion (WELLBUTRIN XL) 300 MG 24 hr tablet, TAKE ONE TABLET BY MOUTH EVERY DAY, Disp: 30 tablet, Rfl: 5 .  buPROPion (WELLBUTRIN XL) 300 MG 24 hr tablet, TAKE ONE TABLET BY MOUTH  EVERY DAY, Disp: 30 tablet, Rfl: 5 .  cephALEXin (KEFLEX) 500 MG capsule, Take 1 capsule (500 mg total) by mouth 4 (four) times daily., Disp: 40 capsule, Rfl: 0 .  DULoxetine (CYMBALTA) 60 MG capsule, Take 1 capsule (60 mg total) by mouth daily., Disp: 30 capsule, Rfl: 2 .  ESTRACE VAGINAL 0.1 MG/GM vaginal cream, INSERT 1 GRAM VAGINALLY 2 TIMES A WEEK, Disp: 42.5 g, Rfl: 0 .  gabapentin (NEURONTIN) 800 MG tablet, Take 1 tablet (800 mg total) by mouth every 6 (six) hours., Disp: 120 tablet, Rfl: 5 .  HYDROcodone-acetaminophen (NORCO) 10-325 MG tablet, Take 1 tablet by mouth every 8 (eight) hours as needed., Disp: 40 tablet, Rfl: 0 .  HYDROcodone-acetaminophen (NORCO/VICODIN) 5-325 MG tablet, Take 1 tablet by mouth every 8 (eight) hours as needed for moderate pain or severe pain., Disp: 50 tablet, Rfl: 0 .  levothyroxine (SYNTHROID, LEVOTHROID) 150 MCG tablet, TAKE ONE TABLET EVERY MORNING, Disp: 30 tablet, Rfl: 5 .   Melatonin 10 MG CAPS, Take 20 mg by mouth at bedtime as needed., Disp: 60 capsule, Rfl: 2 .  metFORMIN (GLUCOPHAGE) 1000 MG tablet, TAKE ONE TABLET TWICE DAILY (Patient taking differently: 1000 mg daily every morning.), Disp: 180 tablet, Rfl: 2 .  metFORMIN (GLUCOPHAGE) 500 MG tablet, Take 500 mg by mouth at bedtime., Disp: , Rfl:  .  metoprolol tartrate (LOPRESSOR) 25 MG tablet, Take 1 tablet (25 mg total) by mouth 2 (two) times daily., Disp: 60 tablet, Rfl: 3 .  mirabegron ER (MYRBETRIQ) 25 MG TB24 tablet, Take 1 tablet (25 mg total) by mouth daily., Disp: 30 tablet, Rfl: 12 .  montelukast (SINGULAIR) 10 MG tablet, TAKE ONE TABLET EVERY DAY, Disp: 90 tablet, Rfl: 2 .  pantoprazole (PROTONIX) 40 MG tablet, Take 1 tablet (40 mg total) by mouth daily., Disp: 90 tablet, Rfl: 2 .  PRADAXA 150 MG CAPS capsule, TAKE 1 CAPSULE BY MOUTH TWICE DAILY, Disp: 180 capsule, Rfl: 5 .  quinapril (ACCUPRIL) 20 MG tablet, TAKE ONE TABLET BY MOUTH EVERY DAY, Disp: 90 tablet, Rfl: 2 .  zolpidem (AMBIEN CR) 12.5 MG CR tablet, Take 1 tablet (12.5 mg total) by mouth at bedtime as needed for sleep., Disp: 20 tablet, Rfl: 2  Past Surgical History  Procedure Laterality Date  . Hernia repair    . Ankle surgery Right   . Vaginal hysterectomy    . Sleeve gastroplasty    . Lithotripsy    . Kidney surgery    . Transthoracic echocardiogram  08/03/2013    Mild-Moderate concentric LVH, EF 60-65%. Normal diastolic function. Mild LA dilation. Mild MR with possible vegetation  - not confirmed on TEE   . Transesophageal echocardiogram  08/08/2013    Mild LVH, EF 60-65%. Moderate LA dilation and mild RA dilation. Mild MR with no evidence of stenosis and no evidence of endocarditis. A false chordae is noted.  . Tonsillectomy    . Total knee arthroplasty    . Hiatal hernia repair    . Joint replacement      left knee replacement    Family History  Problem Relation Age of Onset  . Skin cancer Father   . Diabetes Father   .  Hypertension Father   . Peripheral vascular disease Father   . Cancer Father   . Cerebral aneurysm Father   . Varicose Veins Mother   . Kidney disease Mother   . Mental illness Sister   . Cancer Maternal Aunt     breast  .  Breast cancer Maternal Aunt     Allergies  Allergen Reactions  . Aspirin Hives  . Penicillins Hives  . Terramycin [Oxytetracycline] Hives     Review of Systems  CONSTITUTIONAL: No significant weight changes, fever, chills, weakness or fatigue.  HEENT:  - Eyes: No visual changes.  - Ears: No auditory changes. No pain.  - Nose: No sneezing, congestion, runny nose. - Throat: No sore throat. No changes in swallowing. SKIN: No rash or itching.  CARDIOVASCULAR: No chest pain, chest pressure or chest discomfort. No palpitations or edema.  RESPIRATORY: No shortness of breath, cough or sputum.  GASTROINTESTINAL: No anorexia, nausea, vomiting. No changes in bowel habits. No abdominal pain or blood.  GENITOURINARY: No dysuria. No frequency. No discharge. NEUROLOGICAL: No headache, dizziness, syncope, paralysis, ataxia. Yes numbness or tingling in the extremities. No memory changes. No change in bowel or bladder control.  MUSCULOSKELETAL: Yes joint pain. No muscle pain. HEMATOLOGIC: No anemia, bleeding. Yes bruising.  LYMPHATICS: No enlarged lymph nodes.  PSYCHIATRIC: No change in mood. No change in sleep pattern.  ENDOCRINOLOGIC: No reports of sweating, cold or heat intolerance. No polyuria or polydipsia.     Objective  BP 126/84 mmHg  Pulse 64  Temp(Src) 97.8 F (36.6 C) (Oral)  Resp 14  Wt 268 lb (121.564 kg)  SpO2 95% Body mass index is 44.6 kg/(m^2).  Physical Exam  Constitutional: Patient remains obese and well-nourished. In no distress.  HEENT:  - Head: Normocephalic and atraumatic.  - Ears: Bilateral TMs gray, no erythema or effusion - Nose: Nasal mucosa moist, no bruising, nasal bone intact with bruising of overlying skin. - Mouth/Throat:  Oropharynx is clear and moist. No tonsillar hypertrophy or erythema. No post nasal drainage.  - Eyes: Conjunctivae clear, EOM movements normal. PERRLA. No scleral icterus.  Neck: Normal range of motion. Neck supple. No JVD present. No thyromegaly present.  Cardiovascular: Normal rate, regular rhythm and normal heart sounds.  No murmur heard.  Pulmonary/Chest: Effort normal and breath sounds normal. No respiratory distress. Musculoskeletal: Normal range of motion bilateral UE and LE, no joint effusions. Right knee abrasion and tenderness over patella. Left knee at baseline.  Peripheral vascular: Bilateral LE no edema. Chronic venous stasis dermatitis from high ankle area to 2/3 up tibia/fibia bilaterally. Right LE mid tibia 1 cm ulcer with good granulation tissue and clear drainage with slight erythema of surrounding skin. Left LE mid tibia 3cm raised soft swelling with surrounding bruising. Neurological: CN II-XII grossly intact with no focal deficits. Alert and oriented to person, place, and time. Coordination, balance, strength, speech and gait are normal.  Skin: Skin is warm and dry. No rash noted. No erythema.  Psychiatric: Patient has a normal mood and affect. Behavior is normal in office today. Judgment and thought content normal in office today.   Recent Results (from the past 2160 hour(s))  Please Note     Status: None   Collection Time: 04/17/15 10:50 AM  Result Value Ref Range   Please Note Comment     Comment: The date recorded on the requisition indicates the sample(s) received were greater than 30 hours old upon arrival in our laboratory.   ToxASSURE Select 13 (MW), Urine     Status: None   Collection Time: 04/17/15 10:50 AM  Result Value Ref Range   Report Summary FINAL     Comment: ==================================================================== TOXASSURE SELECT 13 (MW) ==================================================================== Specimen Alert Insufficient  sample volume to complete testing for Barbiturates. ==================================================================== Test  Result       Flag       Units Drug Absent but Declared for Prescription Verification   Alprazolam                     Not Detected UNEXPECTED ng/mg creat   Hydrocodone                    Not Detected UNEXPECTED ng/mg creat ==================================================================== Test                      Result    Flag   Units      Ref Range   Creatinine              312              mg/dL      >=20 ==================================================================== Declared Medications:  The flagging and interpretation on this report are based on the  following declared medications.  Unexpected results may arise from   inaccuracies in the declared medications.  **Note: The testing scope of this panel includes these medications:  Alprazolam (Xanax)  Hydrocodone  **Note: The testing scope of this panel does not include following  reported medications:  Bupropion (Wellbutrin)  Gabapentin  Zolpidem ==================================================================== For clinical consultation, please call [phone]. ====================================================================    PDF .   Comprehensive metabolic panel     Status: Abnormal   Collection Time: 04/17/15 12:37 PM  Result Value Ref Range   Sodium 140 135 - 145 mmol/L   Potassium 5.0 3.5 - 5.1 mmol/L   Chloride 102 101 - 111 mmol/L   CO2 28 22 - 32 mmol/L   Glucose, Bld 138 (H) 65 - 99 mg/dL   BUN 32 (H) 6 - 20 mg/dL   Creatinine, Ser 0.90 0.44 - 1.00 mg/dL   Calcium 9.6 8.9 - 10.3 mg/dL   Total Protein 7.3 6.5 - 8.1 g/dL   Albumin 4.3 3.5 - 5.0 g/dL   AST 16 15 - 41 U/L   ALT 15 14 - 54 U/L   Alkaline Phosphatase 82 38 - 126 U/L   Total Bilirubin 0.7 0.3 - 1.2 mg/dL   GFR calc non Af Amer >60 >60 mL/min   GFR calc Af Amer >60 >60 mL/min     Comment: (NOTE) The eGFR has been calculated using the CKD EPI equation. This calculation has not been validated in all clinical situations. eGFR's persistently <60 mL/min signify possible Chronic Kidney Disease.    Anion gap 10 5 - 15  C-reactive protein     Status: None   Collection Time: 04/17/15 12:37 PM  Result Value Ref Range   CRP 0.6 <1.0 mg/dL    Comment: Performed at [facility]  Magnesium     Status: None   Collection Time: 04/17/15 12:37 PM  Result Value Ref Range   Magnesium 1.8 1.7 - 2.4 mg/dL  Sedimentation rate     Status: None   Collection Time: 04/17/15 12:37 PM  Result Value Ref Range   Sed Rate 12 0 - 30 mm/hr  CBC with Differential/Platelet     Status: Abnormal   Collection Time: 04/17/15 12:37 PM  Result Value Ref Range   WBC 8.5 3.6 - 11.0 K/uL   RBC 4.76 3.80 - 5.20 MIL/uL   Hemoglobin 13.0 12.0 - 16.0 g/dL   HCT 40.6 35.0 - 47.0 %   MCV 85.2 80.0 - 100.0 fL   MCH  27.3 26.0 - 34.0 pg   MCHC 32.0 32.0 - 36.0 g/dL   RDW 14.3 11.5 - 14.5 %   Platelets 242 150 - 440 K/uL   Neutrophils Relative % 80 %   Neutro Abs 6.8 (H) 1.4 - 6.5 K/uL   Lymphocytes Relative 10 %   Lymphs Abs 0.8 (L) 1.0 - 3.6 K/uL   Monocytes Relative 6 %   Monocytes Absolute 0.5 0.2 - 0.9 K/uL   Eosinophils Relative 3 %   Eosinophils Absolute 0.3 0 - 0.7 K/uL   Basophils Relative 1 %   Basophils Absolute 0.1 0 - 0.1 K/uL  NM Myocar Multi W/Spect W/Wall Motion / EF     Status: None   Collection Time: 05/01/15 10:28 AM  Result Value Ref Range   Rest HR 66 bpm   Rest BP 153/60 mmHg   Exercise duration (min) 0 min   Exercise duration (sec) 0 sec   Estimated workload 1.0 METS   Peak HR 86 BPM   Peak BP  mmHg   MPHR 155 bpm   Percent HR 60 %   RPE     LV Systolic Volume 25 mL   TID 0.45    LV Diastolic Volume 85 mL   LHR     SSS 2    SRS 10    SDS 0    Phase 1 name PREINFSN    Stage 1 Name SUPINE    Stage 1 Time 00:00:01    Phase 2 Name [place]    Stage 2  Name HYPERV.    Stage 2 Time 00:00:11    Stage 2 Speed 0.0 mph   Stage 2 Grade 0.0 %   Stage 2 HR 66 bpm   Phase 3 Name Infusion    Stage 3 Name DOSE 1    Stage 3 Attribute Baseline    Stage 3 Time 00:00:00    Stage 3 Speed 0.0 mph   Stage 3 Grade 0.0 %   Stage 3 HR 66 bpm   Phase 4 Name Infusion    Stage 4 Name DOSE 1    Stage 4 Attribute Peak    Stage 4 Time 00:01:09    Stage 4 Speed 0.0 mph   Stage 4 Grade 0.0 %   Stage 4 HR 86 bpm   Phase 5 Name POSTINFSN    Stage 5 Attribute Recovery 79mn    Stage 5 Time 00:01:06    Stage 5 Speed 0.0 mph   Stage 5 Grade 0.0 %   Stage 5 HR 83 bpm   Phase 6 Name POSTINFSN    Stage 6 Time 00:04:31    Stage 6 Speed 0.0 mph   Stage 6 Grade 0.0 %   Stage 6 HR 77 bpm   Stage 6 SBP 168 mmHg   Stage 6 DBP 61 mmHg   Percent of predicted max HR 55 %     Assessment & Plan  1. Accidental fall Needs to use cane or walker more consistently.   - HYDROcodone-acetaminophen (NORCO) 10-325 MG tablet; Take 1 tablet by mouth every 8 (eight) hours as needed.  Dispense: 40 tablet; Refill: 0  2. Right anterior knee pain Will rule out fracture of right knee, left tibia, facial bones.  - DG Knee Complete 4 Views Right; Future - HYDROcodone-acetaminophen (NORCO) 10-325 MG tablet; Take 1 tablet by mouth every 8 (eight) hours as needed.  Dispense: 40 tablet; Refill: 0  3. Pain in tibia, left Soft hematoma spontaneously  resolving.  - DG Tibia/Fibula Left; Future - HYDROcodone-acetaminophen (NORCO) 10-325 MG tablet; Take 1 tablet by mouth every 8 (eight) hours as needed.  Dispense: 40 tablet; Refill: 0  4. Blunt trauma of nose, initial encounter Continue Ice prn (given in clinic)  - DG Facial Bones Complete; Future - HYDROcodone-acetaminophen (NORCO) 10-325 MG tablet; Take 1 tablet by mouth every 8 (eight) hours as needed.  Dispense: 40 tablet; Refill: 0  5. Cellulitis of right lower extremity Early stages of cellulitis. Chronic intermittent  disorder due to peripheral neuropathy and compromised circulation. Restart Keflex  - cephALEXin (KEFLEX) 500 MG capsule; Take 1 capsule (500 mg total) by mouth 4 (four) times daily.  Dispense: 40 capsule; Refill: 0

## 2015-05-24 ENCOUNTER — Telehealth: Payer: Self-pay

## 2015-05-24 DIAGNOSIS — B379 Candidiasis, unspecified: Secondary | ICD-10-CM

## 2015-05-24 DIAGNOSIS — T3695XA Adverse effect of unspecified systemic antibiotic, initial encounter: Principal | ICD-10-CM

## 2015-05-24 MED ORDER — FLUCONAZOLE 150 MG PO TABS: 150.0000 mg | ORAL_TABLET | ORAL | Status: DC

## 2015-05-24 NOTE — Telephone Encounter (Signed)
Rx sent to local pharmacy.  

## 2015-05-24 NOTE — Telephone Encounter (Signed)
Patient called states has yeast infection wants to see about getting 2 rounds of diflucan?

## 2015-05-27 DIAGNOSIS — L03116 Cellulitis of left lower limb: Secondary | ICD-10-CM | POA: Diagnosis not present

## 2015-05-27 DIAGNOSIS — J4520 Mild intermittent asthma, uncomplicated: Secondary | ICD-10-CM | POA: Diagnosis not present

## 2015-05-27 DIAGNOSIS — G4733 Obstructive sleep apnea (adult) (pediatric): Secondary | ICD-10-CM | POA: Diagnosis not present

## 2015-05-27 DIAGNOSIS — E6609 Other obesity due to excess calories: Secondary | ICD-10-CM | POA: Diagnosis not present

## 2015-05-29 ENCOUNTER — Telehealth: Payer: Self-pay | Admitting: Family Medicine

## 2015-05-29 DIAGNOSIS — I999 Unspecified disorder of circulatory system: Secondary | ICD-10-CM

## 2015-05-29 NOTE — Telephone Encounter (Signed)
I have placed referral to  [facility], in general due to her ulcers and hematoma and circulatory issues in her legs I want her to re establish care with them.

## 2015-05-29 NOTE — Telephone Encounter (Signed)
Patient is still having problems with her leg. She would like some advise and a return call.

## 2015-05-29 NOTE — Telephone Encounter (Signed)
Patient states spot has decreased in size but still having a lot of pain wants to see about a referral to vascular?

## 2015-05-31 DIAGNOSIS — G4730 Sleep apnea, unspecified: Secondary | ICD-10-CM | POA: Diagnosis not present

## 2015-05-31 DIAGNOSIS — G4733 Obstructive sleep apnea (adult) (pediatric): Secondary | ICD-10-CM | POA: Diagnosis not present

## 2015-06-04 DIAGNOSIS — M79609 Pain in unspecified limb: Secondary | ICD-10-CM | POA: Diagnosis not present

## 2015-06-04 DIAGNOSIS — I10 Essential (primary) hypertension: Secondary | ICD-10-CM | POA: Diagnosis not present

## 2015-06-04 DIAGNOSIS — M549 Dorsalgia, unspecified: Secondary | ICD-10-CM | POA: Diagnosis not present

## 2015-06-04 DIAGNOSIS — L97211 Non-pressure chronic ulcer of right calf limited to breakdown of skin: Secondary | ICD-10-CM | POA: Diagnosis not present

## 2015-06-04 DIAGNOSIS — M47812 Spondylosis without myelopathy or radiculopathy, cervical region: Secondary | ICD-10-CM | POA: Diagnosis not present

## 2015-06-04 DIAGNOSIS — M19011 Primary osteoarthritis, right shoulder: Secondary | ICD-10-CM | POA: Diagnosis not present

## 2015-06-04 DIAGNOSIS — M542 Cervicalgia: Secondary | ICD-10-CM | POA: Diagnosis not present

## 2015-06-04 DIAGNOSIS — S161XXA Strain of muscle, fascia and tendon at neck level, initial encounter: Secondary | ICD-10-CM | POA: Diagnosis not present

## 2015-06-04 DIAGNOSIS — E119 Type 2 diabetes mellitus without complications: Secondary | ICD-10-CM | POA: Diagnosis not present

## 2015-06-04 DIAGNOSIS — I87091 Postthrombotic syndrome with other complications of right lower extremity: Secondary | ICD-10-CM | POA: Diagnosis not present

## 2015-06-04 DIAGNOSIS — M79601 Pain in right arm: Secondary | ICD-10-CM | POA: Diagnosis not present

## 2015-06-18 DIAGNOSIS — E119 Type 2 diabetes mellitus without complications: Secondary | ICD-10-CM | POA: Diagnosis not present

## 2015-06-18 DIAGNOSIS — I890 Lymphedema, not elsewhere classified: Secondary | ICD-10-CM | POA: Diagnosis not present

## 2015-06-18 DIAGNOSIS — I10 Essential (primary) hypertension: Secondary | ICD-10-CM | POA: Diagnosis not present

## 2015-06-18 DIAGNOSIS — M549 Dorsalgia, unspecified: Secondary | ICD-10-CM | POA: Diagnosis not present

## 2015-06-18 DIAGNOSIS — M7989 Other specified soft tissue disorders: Secondary | ICD-10-CM | POA: Diagnosis not present

## 2015-06-18 DIAGNOSIS — M79609 Pain in unspecified limb: Secondary | ICD-10-CM | POA: Diagnosis not present

## 2015-06-18 DIAGNOSIS — I87091 Postthrombotic syndrome with other complications of right lower extremity: Secondary | ICD-10-CM | POA: Diagnosis not present

## 2015-06-18 DIAGNOSIS — L97211 Non-pressure chronic ulcer of right calf limited to breakdown of skin: Secondary | ICD-10-CM | POA: Diagnosis not present

## 2015-06-20 ENCOUNTER — Other Ambulatory Visit: Payer: Self-pay | Admitting: Vascular Surgery

## 2015-06-21 ENCOUNTER — Ambulatory Visit: Payer: Self-pay | Admitting: Family Medicine

## 2015-06-24 ENCOUNTER — Encounter
Admission: RE | Admit: 2015-06-24 | Discharge: 2015-06-24 | Disposition: A | Discharge status: Home / Self Care | Payer: PPO | Source: Ambulatory Visit | Attending: Vascular Surgery | Admitting: Vascular Surgery

## 2015-06-24 ENCOUNTER — Other Ambulatory Visit: Payer: Self-pay

## 2015-06-24 DIAGNOSIS — Z79899 Other long term (current) drug therapy: Secondary | ICD-10-CM | POA: Diagnosis not present

## 2015-06-24 DIAGNOSIS — G4730 Sleep apnea, unspecified: Secondary | ICD-10-CM | POA: Diagnosis not present

## 2015-06-24 DIAGNOSIS — Z90710 Acquired absence of both cervix and uterus: Secondary | ICD-10-CM | POA: Diagnosis not present

## 2015-06-24 DIAGNOSIS — Z7951 Long term (current) use of inhaled steroids: Secondary | ICD-10-CM | POA: Diagnosis not present

## 2015-06-24 DIAGNOSIS — K219 Gastro-esophageal reflux disease without esophagitis: Secondary | ICD-10-CM

## 2015-06-24 DIAGNOSIS — Z833 Family history of diabetes mellitus: Secondary | ICD-10-CM | POA: Diagnosis not present

## 2015-06-24 DIAGNOSIS — Z841 Family history of disorders of kidney and ureter: Secondary | ICD-10-CM | POA: Diagnosis not present

## 2015-06-24 DIAGNOSIS — I10 Essential (primary) hypertension: Secondary | ICD-10-CM | POA: Diagnosis not present

## 2015-06-24 DIAGNOSIS — E119 Type 2 diabetes mellitus without complications: Secondary | ICD-10-CM | POA: Diagnosis not present

## 2015-06-24 DIAGNOSIS — Z7984 Long term (current) use of oral hypoglycemic drugs: Secondary | ICD-10-CM | POA: Diagnosis not present

## 2015-06-24 DIAGNOSIS — S8012XA Contusion of left lower leg, initial encounter: Secondary | ICD-10-CM | POA: Diagnosis not present

## 2015-06-24 DIAGNOSIS — I739 Peripheral vascular disease, unspecified: Secondary | ICD-10-CM | POA: Diagnosis not present

## 2015-06-24 DIAGNOSIS — Z823 Family history of stroke: Secondary | ICD-10-CM | POA: Diagnosis not present

## 2015-06-24 DIAGNOSIS — Y929 Unspecified place or not applicable: Secondary | ICD-10-CM | POA: Diagnosis not present

## 2015-06-24 DIAGNOSIS — W208XXA Other cause of strike by thrown, projected or falling object, initial encounter: Secondary | ICD-10-CM | POA: Diagnosis not present

## 2015-06-24 DIAGNOSIS — M7541 Impingement syndrome of right shoulder: Secondary | ICD-10-CM | POA: Diagnosis not present

## 2015-06-24 DIAGNOSIS — Y9389 Activity, other specified: Secondary | ICD-10-CM | POA: Diagnosis not present

## 2015-06-24 DIAGNOSIS — Z8249 Family history of ischemic heart disease and other diseases of the circulatory system: Secondary | ICD-10-CM | POA: Diagnosis not present

## 2015-06-24 DIAGNOSIS — Z9049 Acquired absence of other specified parts of digestive tract: Secondary | ICD-10-CM | POA: Diagnosis not present

## 2015-06-24 DIAGNOSIS — Z6841 Body Mass Index (BMI) 40.0 and over, adult: Secondary | ICD-10-CM | POA: Diagnosis not present

## 2015-06-24 LAB — CBC WITH DIFFERENTIAL/PLATELET
BASOS ABS: 0 10*3/uL (ref 0–0.1)
Basophils Relative: 1 %
EOS ABS: 0.2 10*3/uL (ref 0–0.7)
EOS PCT: 3 %
HCT: 36.3 % (ref 35.0–47.0)
Hemoglobin: 12 g/dL (ref 12.0–16.0)
LYMPHS PCT: 11 %
Lymphs Abs: 0.9 10*3/uL — ABNORMAL LOW (ref 1.0–3.6)
MCH: 28.1 pg (ref 26.0–34.0)
MCHC: 33 g/dL (ref 32.0–36.0)
MCV: 85.3 fL (ref 80.0–100.0)
Monocytes Absolute: 0.7 10*3/uL (ref 0.2–0.9)
Monocytes Relative: 9 %
Neutro Abs: 6.6 10*3/uL — ABNORMAL HIGH (ref 1.4–6.5)
Neutrophils Relative %: 76 %
PLATELETS: 264 10*3/uL (ref 150–440)
RBC: 4.25 MIL/uL (ref 3.80–5.20)
RDW: 14.6 % — AB (ref 11.5–14.5)
WBC: 8.5 10*3/uL (ref 3.6–11.0)

## 2015-06-24 LAB — BASIC METABOLIC PANEL
Anion gap: 8 (ref 5–15)
BUN: 21 mg/dL — AB (ref 6–20)
CO2: 25 mmol/L (ref 22–32)
CREATININE: 0.76 mg/dL (ref 0.44–1.00)
Calcium: 9 mg/dL (ref 8.9–10.3)
Chloride: 103 mmol/L (ref 101–111)
Glucose, Bld: 116 mg/dL — ABNORMAL HIGH (ref 65–99)
POTASSIUM: 4.3 mmol/L (ref 3.5–5.1)
SODIUM: 136 mmol/L (ref 135–145)

## 2015-06-24 LAB — SURGICAL PCR SCREEN
MRSA, PCR: NEGATIVE
Staphylococcus aureus: POSITIVE — AB

## 2015-06-24 LAB — APTT: APTT: 42 s — AB (ref 24–36)

## 2015-06-24 LAB — ABO/RH: ABO/RH(D): AB POS

## 2015-06-24 LAB — PROTIME-INR
INR: 1.24
Prothrombin Time: 15.8 seconds — ABNORMAL HIGH (ref 11.4–15.0)

## 2015-06-24 NOTE — Patient Instructions (Signed)
  Your procedure is scheduled on: 06/27/15 Report to Day Surgery. To find out your arrival time please call [phone] between 1PM - 3PM on 06/26/15.  Remember: Instructions that are not followed completely may result in serious medical risk, up to and including death, or upon the discretion of your surgeon and anesthesiologist your surgery may need to be rescheduled.    ___x_ 1. Do not eat food or drink liquids after midnight. No gum chewing or hard candies.     ___x_ 2. No Alcohol for 24 hours before or after surgery.   ____ 3. Bring all medications with you on the day of surgery if instructed.    ___x_ 4. Notify your doctor if there is any change in your medical condition     (cold, fever, infections).     Do not wear jewelry, make-up, hairpins, clips or nail polish.  Do not wear lotions, powders, or perfumes. You may wear deodorant.  Do not shave 48 hours prior to surgery. Men may shave face and neck.  Do not bring valuables to the hospital.    [facility] is not responsible for any belongings or valuables.               Contacts, dentures or bridgework may not be worn into surgery.  Leave your suitcase in the car. After surgery it may be brought to your room.  For patients admitted to the hospital, discharge time is determined by your                treatment team.   Patients discharged the day of surgery will not be allowed to drive home.   Please read over the following fact sheets that you were given:   MRSA Information and Surgical Site Infection Prevention   ____ Take these medicines the morning of surgery with A SIP OF WATER:    1. Xanax if needed  2. Norco if needed  3. Bupropion ( Wellbutrin)  4.Neurontin ( Gabapentin)  5.Synthroid  6.Metoprolol             7.Protonix ____ Fleet Enema (as directed)   _x___ Use CHG Soap as directed  __x__ Use inhalers on the day of surgery  __x__ Stop metformin 2 days prior to surgery    ____ Take 1/2 of usual insulin dose  the night before surgery and none on the morning of surgery.   __x__ Stop on Pradaxa on 4/4  ____ Stop Anti-inflammatories on    __x__ Stop supplements until after surgery.    ___x_ Bring C-Pap to the hospital.

## 2015-06-24 NOTE — Pre-Procedure Instructions (Signed)
Will see Dr. [name] on 4/5 for reevaluation of A FIb. since addition of metoprolol to medication regime.  Requested cardiac clearance.

## 2015-06-24 NOTE — Pre-Procedure Instructions (Signed)
Patient Demographics  Reason for Visit  Encounter Details  Social Hx  Last Filed Vital Signs  Progress Notes  Plan of Treatment  Visit Diagnoses  Historical Medications  Document Information Encounter Summary - [name] [id number] y.o. Female)As of Apr. 03, 2017 Patient Demographics Patient Address Communication Language Race / Ethnicity  [address] [place], [place] [address]  [phone] (Home) [email or web address]  English (Preferred) White / Not Hispanic or Latino  Reason for Visit Reason Comments  COPD   Sleep Apnea   Encounter Details Date Type Department Care Team Description  05/27/2015 Initial consult [facility]  [place], [place] [address]  [phone]  Erby Pian, [place]  [place], [place] [address]  [phone]  [phone] (Fax)  Obstructive sleep apnea syndrome (Primary Dx);Mild intermittent asthma without complication;Non morbid obesity due to excess calories;Left leg cellulitis  Social History - as of this encounter Tobacco Use Types Packs/Day Years Used Date  Never Smoker      Smokeless Tobacco: Never Used      Alcohol Use Drinks/Week oz/Week Comments  No      Birth Sex Date Recorded  Unknown   Last Filed Vital Signs - in this encounter Vital Sign Reading Time Taken  Blood Pressure 169/80 05/27/2015 3:11 PM EST  Pulse 61 05/27/2015 3:11 PM EST  Temperature 36.4 C (97.6 F) 05/27/2015 3:11 PM EST  Respiratory Rate - -  Oxygen Saturation 96% 05/27/2015 3:11 PM EST  Inhaled Oxygen Concentration - -  Weight 121.6 kg (268 lb) 05/27/2015 3:11 PM EST  Height 162.6 cm (5\' 4" ) 05/27/2015 3:11 PM EST  Body Mass Index 46 05/27/2015 3:11 PM EST  Progress Notes - in this encounter Erby Pian, MD - 05/27/2015 2:45 PM EST Formatting of this note may be different from the original.  COPD and Sleep Apnea  History of Present  Illness: [name] is a 65 y.o. female that presents to clinic for copd and sleep apnea evaluation. Her copd is stable, wearing her cpap, need supplies. She has lost some weight. Today no wheezing or sob, no chest pain, ectopy, syncope, hemoptysis, there is swelling more so in the left leg, there is increase hyperemia. Recently drop a chair on her left foot. She also recently fell at a local doctor's office hit face, right shoulder, right breast and right lower extremity.   Current Medications:  Current Outpatient Prescriptions  Medication Sig Dispense Refill  . albuterol (PROVENTIL) 2.5 mg /3 mL (0.083 %) nebulizer solution Inhale 1 vial using nebulizer every six hours as needed  . albuterol 90 mcg/actuation inhaler Inhale 2 puffs using inhaler every four to six hours as needed  . ALPRAZolam (XANAX) 0.5 MG tablet once daily as needed.  Marland Kitchen buPROPion (WELLBUTRIN XL) 300 MG XL tablet once daily.  . dabigatran (PRADAXA) 75 mg capsule Take 75 mg by mouth 2 (two) times daily.  . DULoxetine (CYMBALTA) 60 MG DR capsule once daily.  . fluticasone-salmeterol (ADVAIR HFA) 115-21 mcg/actuation inhaler 2 inhalations every 12 (twelve) hours.  . gabapentin (NEURONTIN) 600 MG tablet 4 (four) times daily.  Marland Kitchen levothyroxine (SYNTHROID, LEVOTHROID) 200 MCG tablet once daily.  . metFORMIN (GLUCOPHAGE) 1000 MG tablet 2 (two) times daily.  . metoprolol tartrate (LOPRESSOR) 25 MG tablet  . mirabegron (MYRBETRIQ) 25 mg Tb24 Take 25 mg by mouth once daily.  . montelukast (SINGULAIR) 10 mg tablet Take 10 mg by mouth once daily.  Marland Kitchen  pantoprazole (PROTONIX) 40 MG DR tablet Take 40 mg by mouth once daily.  . quinapril (ACCUPRIL) 20 MG tablet once daily.  Marland Kitchen zolpidem (AMBIEN CR) 12.5 MG CR tablet at bedtime   No current facility-administered medications for this visit.   Problem List:  Patient Active Problem List  Diagnosis  . Sleep apnea   History: Past Medical History  Diagnosis Date  . Abnormal antibody  titer  history of anti E antibody  . Acalculous cholecystitis  . Asthma without status asthmaticus  . Atrial fibrillation , unspecified (CMS-HCC)  On treatment with Pradaxa  . COPD (chronic obstructive pulmonary disease) , unspecified (CMS-HCC)  . Depression, unspecified  . Dermatophytosis of nail  . Diabetes mellitus with neuropathy (CMS-HCC)  . Edema  . GERD (gastroesophageal reflux disease)  . History of anemia  blood transfusion July 2011  . History of blood transfusion 08/2009  thinks she has IgE antibodies  . History of DVT (deep vein thrombosis)  right leg  . Hyperlipidemia, unspecified  . Hypersomnia with sleep apnea, unspecified  . Hypertension  . Hypothyroidism, unspecified  . Kidney stones  . Morbid obesity (CMS-HCC)  . Sleep apnea  on CPAP  . Urinary incontinence   Past Surgical History  Procedure Laterality Date  . Hysterectomy 1985  Transvaginal hysterectomy with anterior and posterior colporrhaphy  . Surgical repair of rectocele and cystocele 1995  . Foot surgery 2011, 2012  . Knee replacement June 2011  . Tonsillectomy 1965  . Ankle surgery 1992  . Smr  . Gastric sleeve procedure  . Laparoscopic cholecystectomy 2013  . Colonoscopy 05/29/2011  . Egd 05/29/2011   Family History  Problem Relation Age of Onset  . Cancer Father  . Hypertension Other  . Coronary artery disease Other  . Diabetes mellitus Other  . Gout Other  . Thyroid disease Other  . Cancer Other  family member who had cancer of head  . Stroke Other  . Breast cancer Neg Hx  . Colon cancer Neg Hx  . Uterine cancer Neg Hx  . Ovarian cancer Neg Hx  . Liver disease Neg Hx  . Ulcers Neg Hx   Social History   Social History  . Marital status: Married  Spouse name: N/A  . Number of children: N/A  . Years of education: N/A   Social History Main Topics  . Smoking status: Never Smoker  . Smokeless tobacco: Never Used  . Alcohol use No  . Drug use: No  . Sexual activity: Defer    Other Topics Concern  . None   Social History Narrative   Allergies:  Aspirin; Other; Penicillin v potassium; and Terramycin [oxytetracycline]  Review of Systems: As per above. No new associated cardiopulmonary, GI, GU, dermatological symptoms today. No focal neurological symptoms or psychological changes.   Physical Exam: Visit Vitals  . BP 169/80  . Pulse 61  . Temp 36.4 C (97.6 F) (Oral)  . Ht 162.6 cm (5\' 4" )  . Wt (!) 121.6 kg (268 lb)  . SpO2 96%  . BMI 46 kg/m2  (!) 121.6 kg (268 lb) 96% General: NAD. Able to speak in complete sentences without cough or dyspnea HEENT: Normocephalic, nontraumatic. Extraocular movements intact, ecchomotic nose, right orbit NECK: Supple. No JVD, nodes, thyromegaly  CV: RRR no murmurs, gallops, rubs PULM: Normal respiratory effort, Clear to auscultation bilaterally without wheezing or crackles EXTREMITIES: trace l> r edema, increase hyperemia and warmth of the left leg, no cyanosis or Homans' signs ABD: Obese, non  tender SKIN: Fair turgor. No rashes, right breast bruise LYMPHATIC: No nodes NEURO: No gross deficits, no change PSYCH: Appropriate affect, alert, oriented   Impression: Sleep apnea, obstruction, on cpap Asthma, stable (mild intermittent asthma) Obesity, trying to loose weight Left leg cellulitis, on keflex, may need to switch to clindamicin vs levaquinin a diabetic (sugars controlled)  plan -continue to loose weight -continue cpap, refill for supplies given -complete keflex, f/u with pcp -stay on the rest of meds as is -follow up in 6 months    Plan of Treatment - as of this encounter Upcoming Encounters Upcoming Encounters  Date Type Specialty Care Team Description  11/27/2015 Office Visit Pulmonology Erby Pian, MD  [place]  [place], [place] [address]  [phone]  [phone] (Fax)    Visit Diagnoses - in this encounter Diagnosis   Obstructive sleep apnea syndrome - Primary  Obstructive sleep apnea (adult) (pediatric)   Mild intermittent asthma without complication  Non morbid obesity due to excess calories  Left leg cellulitis  Historical Medications - added in this encounter This list may reflect changes made after this encounter.  Medication Sig. Disp. Refills Start Date End Date  metoprolol tartrate (LOPRESSOR) 25 MG tablet     05/21/2015   Document Information Service Providers Document Coverage Dates Mar. 06, 2017 - Mar. 06, 2017 [place] [phone] (Work) [place], [place] [address] Encounter Providers Erby Pian MD (Attending) [phone] (Work) [phone] (Fax)  [place], [place] [address] Encounter Date Mar. 06, 2017 - Mar. 06, 2017

## 2015-06-24 NOTE — Pre-Procedure Instructions (Signed)
Dr. Bunnie Domino office notified of need for wound vac supplies to be sent with the patient on the day of surgery.  Still working on approval from Insurance underwriter.  Will arrange for home health if wound vac support needed.

## 2015-06-25 ENCOUNTER — Other Ambulatory Visit: Payer: PPO

## 2015-06-25 MED ORDER — PANTOPRAZOLE SODIUM 40 MG PO TBEC: 40.0000 mg | DELAYED_RELEASE_TABLET | Freq: Every day | ORAL | Status: DC

## 2015-06-25 NOTE — Pre-Procedure Instructions (Signed)
Faxed results of PCR screen to Dr Lucky Cowboy. Receipt confirmation received

## 2015-06-25 NOTE — Telephone Encounter (Signed)
I will talk to patient about use of this med at next appt; Rx sent with warnings

## 2015-06-26 ENCOUNTER — Telehealth: Payer: Self-pay | Admitting: Cardiology

## 2015-06-26 ENCOUNTER — Encounter: Payer: Self-pay | Admitting: Cardiology

## 2015-06-26 ENCOUNTER — Ambulatory Visit (INDEPENDENT_AMBULATORY_CARE_PROVIDER_SITE_OTHER): Payer: PPO | Admitting: Cardiology

## 2015-06-26 VITALS — BP 150/88 | HR 58 | Ht 64.0 in | Wt 280.0 lb

## 2015-06-26 DIAGNOSIS — I10 Essential (primary) hypertension: Secondary | ICD-10-CM | POA: Diagnosis not present

## 2015-06-26 DIAGNOSIS — I480 Paroxysmal atrial fibrillation: Secondary | ICD-10-CM

## 2015-06-26 DIAGNOSIS — R079 Chest pain, unspecified: Secondary | ICD-10-CM

## 2015-06-26 DIAGNOSIS — Z7901 Long term (current) use of anticoagulants: Secondary | ICD-10-CM | POA: Diagnosis not present

## 2015-06-26 DIAGNOSIS — E1351 Other specified diabetes mellitus with diabetic peripheral angiopathy without gangrene: Secondary | ICD-10-CM

## 2015-06-26 DIAGNOSIS — Z01810 Encounter for preprocedural cardiovascular examination: Secondary | ICD-10-CM

## 2015-06-26 MED ORDER — FLECAINIDE ACETATE 100 MG PO TABS: 200.0000 mg | ORAL_TABLET | ORAL | Status: DC | PRN

## 2015-06-26 NOTE — Progress Notes (Signed)
PCP: Bobetta Lime, MD  Clinic Note: Chief Complaint  Patient presents with  . Pre-op Exam    Cardiac clearance. Meds reviewed verbally with pt.  . Atrial Fibrillation    HPI: [name] is a 65 y.o. female with a PMH below who presents today for prolonged follow-up for PAF. She is on Ranexa for anticoagulation. She noted to have short bursts of A. fib with frequent PACs and PVCs noted on CardioNet monitor. She has now become more symptomatic with her atrial fibrillation symptoms  [name] was last seen in Feb 2017 for recurrent Afib.  Stressed out b/c her son had MI this weekend - PCI to LAD. B/c of an uneasiness sensation in her chest and recurrence of A. fib, we decided to have her undergo nuclear stress test to exclude ischemic etiology. Plan was to follow-up after this to see if potentially consider using flecainide as he did maintenance medication or pill in the pocket.   Recent Hospitalizations: None  Studies Reviewed:   Myoview February 2017: Study Result     1. There was no ST segment deviation noted during stress. 2. No T wave inversion was noted during stress. 3. Defect 1: There is a defect present in the apex location. Likely breast attenuation. 4. The study is normal. 5. This is a low risk study. 6. The left ventricular ejection fraction is normal (55-65%). 7. A-fib on baseline ECG.    Interval History: [name] returns today, doing much better since starting BB.  Feels like Heart is @ better pace & rhythm. On the go --> dropped a chair on her leg - is due for I&D of lesion on L LE tomorrow.  Is needing pre-op.  She says that she is aware when she is in A. fib, she notices a "a little different uneasy sensation" in her chest. Is not a chest pain or pressure, but she has noted a little bit that when she exerts herself. She doesn't notice this "uneasiness " as much when she is active or exerting herself, but has had some chest discomfort with exerting  herself. Now notices irregular Heartbeats -- notes more when at rest & near end of the day. She has not however noticed any recurrence of symptoms that would suggest A. fib.    No chest pain at rest, but does note occasional episodes with exertion. Also notes some shortness of breath with rest or exertion.  No PND, orthopnea with mild intermittent edema.  No she does occasionally feel hard pounding beats if she is anxious, and is occasionally dizzy. No generalized weakness or syncope/near syncope.  No TIA/amaurosis fugax symptoms. No limiting claudication.  ROS: A comprehensive was performed. Pertinent cardiac symptoms noted above Review of Systems  Constitutional: Negative for malaise/fatigue.  HENT: Negative for hearing loss and nosebleeds.   Respiratory: Negative for cough, shortness of breath and wheezing.   Cardiovascular: Positive for leg swelling.       Per history of present illness  Gastrointestinal: Negative for heartburn, constipation, blood in stool and melena.  Genitourinary: Negative for hematuria.  Musculoskeletal: Positive for joint pain. Negative for myalgias and falls.       She is a large boil on her left leg from her injury  Neurological: Positive for dizziness. Negative for headaches.  Psychiatric/Behavioral: Negative for depression. The patient is nervous/anxious. The patient does not have insomnia.   All other systems reviewed and are negative.   Past Medical History  Diagnosis Date  .  Sinus problem   . Swelling   . Thyroid disease   . Diabetes ([place])   . Venous stasis dermatitis of both lower extremities   . Hx of blood clots   . HBP (high blood pressure)   . Kidney disease   . Depression   . Dysthymic disorder   . Paroxysmal atrial fibrillation ([place]) 2007    In June 2015, Cardiac Event Monitor: Mostly SR/sinus arrhythmia with PVCs that are frequent. Short bursts of A. fib lasting several minutes  . Asthma   . GERD (gastroesophageal reflux disease)   .  Heart murmur     Echocardiogram June 2015: Mild MR (possible vegetation seen on TTE, not seen on TEE), normal LV size with moderate concentric LVH. Normal function EF 60-65%. Normal diastolic function. Mild LA dilation.  . DVT (deep venous thrombosis) ([place])   . Insomnia   . Atrial fibrillation, controlled ([place])   . Cystocele   . Neuropathy involving both lower extremities ([place])   . Anxiety and depression   . Obesity, Class II, BMI 35-39.9, with comorbidity ([place])   . Cholecystolithiasis   . Vaginitis   . Kidney stone   . Adrenal mass ([place])   . Sleep apnea   . Reflux   . Anxiety   . Arthritis      Past Surgical History  Procedure Laterality Date  . Hernia repair    . Ankle surgery Right   . Vaginal hysterectomy    . Sleeve gastroplasty    . Lithotripsy    . Kidney surgery    . Transthoracic echocardiogram  08/03/2013    Mild-Moderate concentric LVH, EF 60-65%. Normal diastolic function. Mild LA dilation. Mild MR with possible vegetation  - not confirmed on TEE   . Transesophageal echocardiogram  08/08/2013    Mild LVH, EF 60-65%. Moderate LA dilation and mild RA dilation. Mild MR with no evidence of stenosis and no evidence of endocarditis. A false chordae is noted.  . Tonsillectomy    . Total knee arthroplasty    . Hiatal hernia repair    . Joint replacement      left knee replacement  . Cholecystectomy    . I&d extremity Left 06/27/2015    Procedure: IRRIGATION AND DEBRIDEMENT EXTREMITY            ( CALF HEMATOMA ) POSSIBLE WOUND VAC;  Surgeon: [name], MD;  Location: [facility] ORS;  Service: Vascular;  Laterality: Left;  . Application of wound vac Left 06/27/2015    Procedure: APPLICATION OF WOUND VAC ( POSSIBLE ) ;  Surgeon: [name], MD;  Location: [facility] ORS;  Service: Vascular;  Laterality: Left;    Prior to Admission medications   Medication Sig Start Date End Date Taking? Authorizing Provider  albuterol (PROVENTIL HFA;VENTOLIN HFA) 108 (90 Base) MCG/ACT inhaler Inhale  1-2 puffs into the lungs every 4 (four) hours as needed for wheezing or shortness of breath. 03/29/15  Yes Bobetta Lime, MD  albuterol (PROVENTIL) (2.5 MG/3ML) 0.083% nebulizer solution Take 3 mLs (2.5 mg total) by nebulization every 6 (six) hours as needed for wheezing or shortness of breath. 02/21/15  Yes Ashok Norris, MD  ALPRAZolam Duanne Moron) 0.5 MG tablet TAKE ONE TABLET TWICE DAILY AS NEEDED 11/08/14  Yes Bobetta Lime, MD  buPROPion (WELLBUTRIN XL) 300 MG 24 hr tablet TAKE ONE TABLET BY MOUTH EVERY DAY 03/29/15  Yes Bobetta Lime, MD  DULoxetine (CYMBALTA) 60 MG capsule Take 1 capsule (60 mg total) by mouth  daily. 04/17/15  Yes Milinda Pointer, MD  ESTRACE VAGINAL 0.1 MG/GM vaginal cream INSERT 1 GRAM VAGINALLY 2 TIMES A WEEK 03/21/15  Yes Alanda Slim Defrancesco, MD  gabapentin (NEURONTIN) 800 MG tablet Take 1 tablet (800 mg total) by mouth every 6 (six) hours. 04/17/15  Yes Milinda Pointer, MD  HYDROcodone-acetaminophen (NORCO/VICODIN) 5-325 MG tablet Take 1 tablet by mouth every 8 (eight) hours as needed for moderate pain or severe pain. 04/17/15  Yes Milinda Pointer, MD  levothyroxine (SYNTHROID, LEVOTHROID) 150 MCG tablet TAKE ONE TABLET EVERY MORNING 01/07/15  Yes Bobetta Lime, MD  Melatonin 10 MG CAPS Take 20 mg by mouth at bedtime as needed. 04/17/15  Yes Milinda Pointer, MD  metFORMIN (GLUCOPHAGE) 1000 MG tablet TAKE ONE TABLET TWICE DAILY Patient taking differently: 1000 mg daily every morning. 03/06/15  Yes Bobetta Lime, MD  metFORMIN (GLUCOPHAGE) 500 MG tablet Take 500 mg by mouth at bedtime.   Yes Historical Provider, MD  mirabegron ER (MYRBETRIQ) 25 MG TB24 tablet Take 1 tablet (25 mg total) by mouth daily. 11/09/14  Yes Shannon A McGowan, PA-C  montelukast (SINGULAIR) 10 MG tablet TAKE ONE TABLET EVERY DAY 08/24/14  Yes Bobetta Lime, MD  pantoprazole (PROTONIX) 40 MG tablet Take 1 tablet (40 mg total) by mouth daily. 08/31/14  Yes Bobetta Lime, MD  PRADAXA 150 MG CAPS  capsule TAKE 1 CAPSULE BY MOUTH TWICE DAILY 01/07/15  Yes Bobetta Lime, MD  quinapril (ACCUPRIL) 20 MG tablet TAKE ONE TABLET BY MOUTH EVERY DAY 02/11/15  Yes Bobetta Lime, MD  zolpidem (AMBIEN CR) 12.5 MG CR tablet Take 1 tablet (12.5 mg total) by mouth at bedtime as needed for sleep. 04/17/15  Yes Milinda Pointer, MD    Allergies  Allergen Reactions  . Aspirin Hives  . Penicillins Hives  . Terramycin [Oxytetracycline] Hives    Social History   Social History  . Marital Status: Married    Spouse Name: N/A  . Number of Children: N/A  . Years of Education: N/A   Social History Main Topics  . Smoking status: Never Smoker   . Smokeless tobacco: Never Used  . Alcohol Use: No  . Drug Use: No  . Sexual Activity: No   Other Topics Concern  . None   Social History Narrative   She is currently married -- for 28 years. Does not work. Does not smoke or take alcohol. She never smoked. She exercises at least 3 days a week since before her gastric surgery.   Marital status reviewed in history of present illness.   Family History  Problem Relation Age of Onset  . Skin cancer Father   . Diabetes Father   . Hypertension Father   . Peripheral vascular disease Father   . Cancer Father   . Cerebral aneurysm Father   . Varicose Veins Mother   . Kidney disease Mother   . Mental illness Sister   . Cancer Maternal Aunt     breast  . Breast cancer Maternal Aunt     Wt Readings from Last 3 Encounters:  06/27/15 260 lb (117.935 kg)  06/26/15 280 lb (127.007 kg)  06/24/15 261 lb (118.389 kg)    PHYSICAL EXAM BP 150/88 mmHg  Pulse 58  Ht 5\' 4"  (1.626 m)  Wt 280 lb (127.007 kg)  BMI 48.04 kg/m2 General appearance: alert, cooperative, appears stated age, no distress, moderately obese and A pleasant mood with normal affect. Answers questions appropriately. Neck: no adenopathy, no carotid bruit, no JVD and  supple, symmetrical, trachea midline Lungs: clear to auscultation  bilaterally, normal percussion bilaterally and Nonlabored, good air movement Heart: Irregularly irregular. Normal rate. Distant, but mostly normal S1 and S2. No notable murmur heard. No rubs or gallops heard. Unable to palpate PMI due to body habitus. Abdomen: Obese but otherwise soft/NT/M.D./NA BS. No HSM palpable due to obesity there Extremities: She actually does not have any edema but had bilateral venous stasis changes with multiple little bandages from recent infections. Pulses: 2+ and symmetric Neurologic: Alert and oriented X 3, normal strength and tone. Normal symmetric reflexes. Normal coordination and gait    Adult ECG Report  Rate: 58;  Rhythm: sinus bradycardia and Otherwise normal intervals, durations and voltage.;   Narrative Interpretation: Atrial fibrillation is replaced by sinus rhythm   Other studies Reviewed: Additional studies/ records that were reviewed today include:  Recent Labs:  Checked by PCP Lab Results  Component Value Date   CHOL 185 10/10/2014   HDL 94 10/10/2014   LDLCALC 73 10/10/2014   TRIG 89 10/10/2014   CHOLHDL 2.0 10/10/2014    ASSESSMENT / PLAN: Problem List Items Addressed This Visit    Preoperative cardiovascular examination    Recent Myoview was negative. This would significantly reduced risk overall. From a cardiovascular risk standpoint.  PREOPERATIVE CARDIAC RISK ASSESSMENT   Revised Cardiac Risk Index:  High Risk Surgery: no; I&D of leg wound  Defined as Intraperitoneal, intrathoracic or suprainguinal vascular  Active CAD: no; Recent Myoview negative & no prior history  CHF: no; edema is more c/w venous stasis  Cerebrovascular Disease: no;   Diabetes: yes; On Insulin: no  CKD (Cr >~ 2): no; <1  Total: 0  Estimated Risk of Adverse Outcome: VERY LOW  Estimated Risk of MI, PE, VF/VT (Cardiac Arrest), Complete Heart Block: <1 %; she does have a history of PAF which would put her at risk for recurrent A. Fib, if this occurs,  I would use amiodarone to treat perioperatively. She is on Pradaxa was going to be held 48 hours preprocedure.  As for any other preoperative evaluation, she just had a stress test so no further evaluation is required.       Peripheral vascular disease due to secondary diabetes mellitus ([place]) (Chronic)    Thankfully she is not really having a lot of limiting claudication. Just concerned about the healing process at the lesion.      Relevant Medications   flecainide (TAMBOCOR) 100 MG tablet   Other Relevant Orders   EKG 12-Lead (Completed)   Paroxysmal atrial fibrillation (HCC) - Primary (Chronic)    No recurrent episodes. It would appear that she converted on a blocker. At this point I would try to avoid antiarrhythmic, but based on her level of symptoms, they'll be the next option. Low risk Myoview stress test would suggest nonischemic etiology.  I will start pill in the pocket flecainide.  Her CHADS2VASC2 score is ~4 --  she has been stable on Pradaxa with no notable bleeding. I did say that if she has another accident like she had word care doctor and leg, she can stop it temporarily to allow for bleeding or bruising to stop.          Relevant Medications   flecainide (TAMBOCOR) 100 MG tablet   Other Relevant Orders   EKG 12-Lead (Completed)   Hypertension goal BP (blood pressure) < 150/90    Pretty much right at the upper limit of what I am listing as her goal. She  is on beta blocker and ACE inhibitor. We may need to consider gradually titrating up, or adding another agent. I would consider a diuretic since she has had issues with edema.      Relevant Medications   flecainide (TAMBOCOR) 100 MG tablet   Other Relevant Orders   EKG 12-Lead (Completed)   Chronic anticoagulation  (Chronic)    Stable on Pradaxa      Relevant Orders   EKG 12-Lead (Completed)   Chest pain with low risk for cardiac etiology    Negative Myoview. This would suggest nonischemic discomfort. She  could just be symptomatic from A. fib.         Current medicines are reviewed at length with the patient today. (+/- concerns) none  Medication Instructions:  Your physician has recommended you make the following change in your medication:  START taking flecainide as needed for afib. If needed, take 200mg  flecainide with an additional dose of metoprolol. If symptoms are not improved, may take 100mg  flecainide SIX HOURS AFTER THE FIRST DOSE.  If symptoms continue to persist, contact our office.    Continue metoprolol 25 mg twice a day  Studies Ordered:   Orders Placed This Encounter  Procedures  . EKG 12-Lead   ROV ~4 months   Winfred Iiams, Leonie Green, M.D., M.S. Interventional Cardiologist   Pager # [phone] Phone # [phone] [place],  [address]

## 2015-06-26 NOTE — Patient Instructions (Signed)
Medication Instructions:  Your physician has recommended you make the following change in your medication:  START taking flecainide as needed for afib. If needed, take 200mg  flecainide with an additional dose of metoprolol. If symptoms are not improved, may take 100mg  flecainide SIX HOURS AFTER THE FIRST DOSE.  If symptoms continue to persist, contact our office.    Labwork: none  Testing/Procedures: none  Follow-Up: Your physician wants you to follow-up in: 4 months with Dr. [name].  You will receive a reminder letter in the mail two months in advance. If you don't receive a letter, please call our office to schedule the follow-up appointment.   Any Other Special Instructions Will Be Listed Below (If Applicable).     If you need a refill on your cardiac medications before your next appointment, please call your pharmacy.

## 2015-06-26 NOTE — Telephone Encounter (Signed)
Faxed clearance for surgery tomorrow to [facility] [phone]

## 2015-06-27 ENCOUNTER — Encounter
Admission: RE | Disposition: A | Discharge status: Home / Self Care | Payer: Self-pay | Source: Ambulatory Visit | Attending: Vascular Surgery

## 2015-06-27 ENCOUNTER — Ambulatory Visit
Admission: RE | Admit: 2015-06-27 | Discharge: 2015-06-27 | Disposition: A | Discharge status: Home / Self Care | Payer: PPO | Source: Ambulatory Visit | Attending: Vascular Surgery | Admitting: Vascular Surgery

## 2015-06-27 ENCOUNTER — Encounter: Payer: Self-pay | Admitting: *Deleted

## 2015-06-27 ENCOUNTER — Ambulatory Visit: Payer: PPO | Admitting: Certified Registered"

## 2015-06-27 DIAGNOSIS — L97211 Non-pressure chronic ulcer of right calf limited to breakdown of skin: Secondary | ICD-10-CM | POA: Diagnosis not present

## 2015-06-27 DIAGNOSIS — Z7984 Long term (current) use of oral hypoglycemic drugs: Secondary | ICD-10-CM | POA: Insufficient documentation

## 2015-06-27 DIAGNOSIS — S8012XA Contusion of left lower leg, initial encounter: Secondary | ICD-10-CM | POA: Insufficient documentation

## 2015-06-27 DIAGNOSIS — M7989 Other specified soft tissue disorders: Secondary | ICD-10-CM | POA: Diagnosis not present

## 2015-06-27 DIAGNOSIS — F418 Other specified anxiety disorders: Secondary | ICD-10-CM | POA: Diagnosis not present

## 2015-06-27 DIAGNOSIS — Z833 Family history of diabetes mellitus: Secondary | ICD-10-CM | POA: Insufficient documentation

## 2015-06-27 DIAGNOSIS — I10 Essential (primary) hypertension: Secondary | ICD-10-CM | POA: Diagnosis not present

## 2015-06-27 DIAGNOSIS — Y9389 Activity, other specified: Secondary | ICD-10-CM | POA: Insufficient documentation

## 2015-06-27 DIAGNOSIS — Z90710 Acquired absence of both cervix and uterus: Secondary | ICD-10-CM | POA: Insufficient documentation

## 2015-06-27 DIAGNOSIS — Y929 Unspecified place or not applicable: Secondary | ICD-10-CM | POA: Insufficient documentation

## 2015-06-27 DIAGNOSIS — Z9049 Acquired absence of other specified parts of digestive tract: Secondary | ICD-10-CM | POA: Insufficient documentation

## 2015-06-27 DIAGNOSIS — I87091 Postthrombotic syndrome with other complications of right lower extremity: Secondary | ICD-10-CM | POA: Diagnosis not present

## 2015-06-27 DIAGNOSIS — Z79899 Other long term (current) drug therapy: Secondary | ICD-10-CM | POA: Insufficient documentation

## 2015-06-27 DIAGNOSIS — Z841 Family history of disorders of kidney and ureter: Secondary | ICD-10-CM | POA: Insufficient documentation

## 2015-06-27 DIAGNOSIS — I739 Peripheral vascular disease, unspecified: Secondary | ICD-10-CM | POA: Insufficient documentation

## 2015-06-27 DIAGNOSIS — Z7951 Long term (current) use of inhaled steroids: Secondary | ICD-10-CM | POA: Insufficient documentation

## 2015-06-27 DIAGNOSIS — E119 Type 2 diabetes mellitus without complications: Secondary | ICD-10-CM | POA: Diagnosis not present

## 2015-06-27 DIAGNOSIS — Z6841 Body Mass Index (BMI) 40.0 and over, adult: Secondary | ICD-10-CM | POA: Insufficient documentation

## 2015-06-27 DIAGNOSIS — W208XXA Other cause of strike by thrown, projected or falling object, initial encounter: Secondary | ICD-10-CM | POA: Insufficient documentation

## 2015-06-27 DIAGNOSIS — Z823 Family history of stroke: Secondary | ICD-10-CM | POA: Insufficient documentation

## 2015-06-27 DIAGNOSIS — K219 Gastro-esophageal reflux disease without esophagitis: Secondary | ICD-10-CM | POA: Insufficient documentation

## 2015-06-27 DIAGNOSIS — M7981 Nontraumatic hematoma of soft tissue: Secondary | ICD-10-CM | POA: Diagnosis not present

## 2015-06-27 DIAGNOSIS — I890 Lymphedema, not elsewhere classified: Secondary | ICD-10-CM | POA: Diagnosis not present

## 2015-06-27 DIAGNOSIS — Z8249 Family history of ischemic heart disease and other diseases of the circulatory system: Secondary | ICD-10-CM | POA: Insufficient documentation

## 2015-06-27 DIAGNOSIS — G4730 Sleep apnea, unspecified: Secondary | ICD-10-CM | POA: Insufficient documentation

## 2015-06-27 DIAGNOSIS — M549 Dorsalgia, unspecified: Secondary | ICD-10-CM | POA: Diagnosis not present

## 2015-06-27 DIAGNOSIS — J45909 Unspecified asthma, uncomplicated: Secondary | ICD-10-CM | POA: Diagnosis not present

## 2015-06-27 DIAGNOSIS — T8189XA Other complications of procedures, not elsewhere classified, initial encounter: Secondary | ICD-10-CM | POA: Diagnosis not present

## 2015-06-27 DIAGNOSIS — M79609 Pain in unspecified limb: Secondary | ICD-10-CM | POA: Diagnosis not present

## 2015-06-27 HISTORY — PX: APPLICATION OF WOUND VAC: SHX5189

## 2015-06-27 HISTORY — PX: I & D EXTREMITY: SHX5045

## 2015-06-27 LAB — GLUCOSE, CAPILLARY
GLUCOSE-CAPILLARY: 170 mg/dL — AB (ref 65–99)
Glucose-Capillary: 155 mg/dL — ABNORMAL HIGH (ref 65–99)

## 2015-06-27 SURGERY — IRRIGATION AND DEBRIDEMENT EXTREMITY
Anesthesia: General | Laterality: Left | Wound class: Clean Contaminated

## 2015-06-27 MED ORDER — FLUCONAZOLE 100 MG PO TABS: 100.0000 mg | ORAL_TABLET | Freq: Every day | ORAL | Status: AC

## 2015-06-27 MED ORDER — MIDAZOLAM HCL 2 MG/2ML IJ SOLN
INTRAMUSCULAR | Status: DC | PRN
  Administered 2015-06-27: 1 mg via INTRAVENOUS

## 2015-06-27 MED ORDER — ONDANSETRON HCL 4 MG/2ML IJ SOLN: 4.0000 mg | Freq: Once | INTRAMUSCULAR | Status: DC | PRN

## 2015-06-27 MED ORDER — ALFENTANIL 500 MCG/ML IJ INJ
INJECTION | INTRAMUSCULAR | Status: DC | PRN
  Administered 2015-06-27: 500 ug via INTRAVENOUS

## 2015-06-27 MED ORDER — LIDOCAINE HCL 2 % EX GEL
CUTANEOUS | Status: DC | PRN
  Administered 2015-06-27: 1 via TOPICAL

## 2015-06-27 MED ORDER — CIPROFLOXACIN HCL 500 MG PO TABS: 500.0000 mg | ORAL_TABLET | Freq: Two times a day (BID) | ORAL | Status: DC

## 2015-06-27 MED ORDER — ONDANSETRON HCL 4 MG/2ML IJ SOLN
INTRAMUSCULAR | Status: DC | PRN
  Administered 2015-06-27: 4 mg via INTRAVENOUS

## 2015-06-27 MED ORDER — PROPOFOL 10 MG/ML IV BOLUS
INTRAVENOUS | Status: DC | PRN
  Administered 2015-06-27: 200 mg via INTRAVENOUS

## 2015-06-27 MED ORDER — SODIUM CHLORIDE 0.9 % IV SOLN
INTRAVENOUS | Status: DC
  Administered 2015-06-27 (×2): via INTRAVENOUS

## 2015-06-27 MED ORDER — FENTANYL CITRATE (PF) 100 MCG/2ML IJ SOLN
INTRAMUSCULAR | Status: AC
  Filled 2015-06-27: qty 2

## 2015-06-27 MED ORDER — HYDROCODONE-ACETAMINOPHEN 5-325 MG PO TABS: 1.0000 | ORAL_TABLET | Freq: Four times a day (QID) | ORAL | Status: DC | PRN

## 2015-06-27 MED ORDER — HYDROCODONE-ACETAMINOPHEN 5-325 MG PO TABS
1.0000 | ORAL_TABLET | Freq: Four times a day (QID) | ORAL | Status: DC | PRN
Start: 2015-06-27 — End: 2015-07-12

## 2015-06-27 MED ORDER — VANCOMYCIN HCL IN DEXTROSE 1-5 GM/200ML-% IV SOLN
INTRAVENOUS | Status: AC
  Filled 2015-06-27: qty 200

## 2015-06-27 MED ORDER — EPHEDRINE SULFATE 50 MG/ML IJ SOLN
INTRAMUSCULAR | Status: DC | PRN
  Administered 2015-06-27: 10 mg via INTRAVENOUS

## 2015-06-27 MED ORDER — FENTANYL CITRATE (PF) 100 MCG/2ML IJ SOLN
25.0000 ug | INTRAMUSCULAR | Status: DC | PRN
  Administered 2015-06-27 (×5): 25 ug via INTRAVENOUS

## 2015-06-27 MED ORDER — VANCOMYCIN HCL IN DEXTROSE 1-5 GM/200ML-% IV SOLN
1000.0000 mg | Freq: Once | INTRAVENOUS | Status: AC
  Administered 2015-06-27: 1000 mg via INTRAVENOUS

## 2015-06-27 MED ORDER — HYDROCODONE-ACETAMINOPHEN 5-325 MG PO TABS
ORAL_TABLET | ORAL | Status: AC
  Administered 2015-06-27: 1
  Filled 2015-06-27: qty 1

## 2015-06-27 MED ORDER — LIDOCAINE HCL (CARDIAC) 20 MG/ML IV SOLN
INTRAVENOUS | Status: DC | PRN
  Administered 2015-06-27: 30 mg via INTRAVENOUS

## 2015-06-27 SURGICAL SUPPLY — 19 items
CANISTER SUCT 1200ML W/VALVE (MISCELLANEOUS) ×2 IMPLANT
DRAPE INCISE IOBAN 66X45 STRL (DRAPES) ×2 IMPLANT
DRAPE LAPAROTOMY T 102X78X121 (DRAPES) ×2 IMPLANT
ELECT REM PT RETURN 9FT ADLT (ELECTROSURGICAL) ×2
ELECTRODE REM PT RTRN 9FT ADLT (ELECTROSURGICAL) ×1 IMPLANT
GLOVE BIO SURGEON STRL SZ7 (GLOVE) ×2 IMPLANT
GOWN STRL REUS W/ TWL LRG LVL3 (GOWN DISPOSABLE) ×1 IMPLANT
GOWN STRL REUS W/ TWL XL LVL3 (GOWN DISPOSABLE) ×1 IMPLANT
GOWN STRL REUS W/TWL LRG LVL3 (GOWN DISPOSABLE) ×2
GOWN STRL REUS W/TWL XL LVL3 (GOWN DISPOSABLE) ×2
KIT RM TURNOVER STRD PROC AR (KITS) ×2 IMPLANT
NS IRRIG 1000ML POUR BTL (IV SOLUTION) ×2 IMPLANT
PACK BASIN MINOR ARMC (MISCELLANEOUS) ×2 IMPLANT
SOL PREP PVP 2OZ (MISCELLANEOUS) ×2
SOLUTION PREP PVP 2OZ (MISCELLANEOUS) ×1 IMPLANT
SPONGE LAP 18X18 5 PK (GAUZE/BANDAGES/DRESSINGS) ×2 IMPLANT
SUT VIC AB 3-0 SH 27 (SUTURE) ×2
SUT VIC AB 3-0 SH 27X BRD (SUTURE) IMPLANT
SYR BULB IRRIG 60ML STRL (SYRINGE) ×1 IMPLANT

## 2015-06-27 NOTE — Transfer of Care (Signed)
Immediate Anesthesia Transfer of Care Note  Patient: [name]  Procedure(s) Performed: Procedure(s): IRRIGATION AND DEBRIDEMENT EXTREMITY            ( CALF HEMATOMA ) POSSIBLE WOUND VAC (Left) APPLICATION OF WOUND VAC ( POSSIBLE )  (Left)  Patient Location: PACU  Anesthesia Type:General  Level of Consciousness: awake, oriented and patient cooperative  Airway & Oxygen Therapy: Patient Spontanous Breathing and Patient connected to face mask oxygen  Post-op Assessment: Report given to RN and Post -op Vital signs reviewed and stable  Post vital signs: Reviewed and stable  Last Vitals:  Filed Vitals:   06/27/15 1037 06/27/15 1330  BP: 185/92 131/66  Pulse: 52 63  Temp: 36.3 C 36.6 C  Resp: 16 15    Complications: No apparent anesthesia complications

## 2015-06-27 NOTE — Discharge Instructions (Signed)

## 2015-06-27 NOTE — Progress Notes (Signed)
Supply rep here earlier and went over things with spouse.  Dorene Sorrow took OR needed supplies with her to OR.  Other supplies put in postop with pt's other belongings.

## 2015-06-27 NOTE — Op Note (Addendum)
    OPERATIVE NOTE   PROCEDURE: 1. Evacuation of hematoma left calf and VAC dressing placement  PRE-OPERATIVE DIAGNOSIS: Hematoma left anterior calf with skin breakdown  POST-OPERATIVE DIAGNOSIS: Same as above  SURGEON: Leotis Pain, MD  ASSISTANT(S): Hezzie Bump, PA-C  ANESTHESIA: general  ESTIMATED BLOOD LOSS: 20 cc  FINDING(S): None  SPECIMEN(S):  none  INDICATIONS:   [name] is a 65 y.o. female who presents with a hematoma of the left calf that is not improving and now has areas of skin breakdown.  This needs to be evacuated to save the skin and promote healing and improved pain.  Risks and benefits are discussed and she desires to proceed.  DESCRIPTION: After obtaining full informed written consent, the patient was brought back to the operating room and placed supine upon the operating table.  The patient received IV antibiotics prior to induction.  After obtaining adequate anesthesia, the patient was prepped and draped in the standard fashion for.  An oblique incision was created overlying the hematoma in the area of skin breakdown. This was about the size of a golf ball.  About 20-30 cc of currant jelly appearing hematoma was removed.  There was no purulent material or necrosis.  The wound was copiously irrigated with sterile saline.  The subcutaneous tissues were closed with 3-0 Vicryl.  A VAC dressing was then cut to fit the wound and connected to suction with a good seal obtained with the dressing. The patient was then awakened from anesthesia and taken to the recovery room in stable condition having tolerated the procedure well.  COMPLICATIONS: none  CONDITION: stable  [name],[name]  06/27/2015, 1:10 PM

## 2015-06-27 NOTE — Progress Notes (Signed)
Order for SCDs cancelled as per Dr. Bunnie Domino instructions after looking at both patient's legs

## 2015-06-27 NOTE — Anesthesia Procedure Notes (Signed)
Procedure Name: LMA Insertion Date/Time: 06/27/2015 12:39 PM Performed by: Courtney Paris Pre-anesthesia Checklist: Patient identified, Emergency Drugs available, Suction available, Patient being monitored and Timeout performed Patient Re-evaluated:Patient Re-evaluated prior to inductionOxygen Delivery Method: Circle system utilized Preoxygenation: Pre-oxygenation with 100% oxygen Intubation Type: Combination inhalational/ intravenous induction Ventilation: Mask ventilation without difficulty LMA: LMA inserted LMA Size: 3.0 Grade View: Grade III Tube type: Oral Number of attempts: 1 Placement Confirmation: positive ETCO2,  CO2 detector and breath sounds checked- equal and bilateral Tube secured with: Tape Dental Injury: Teeth and Oropharynx as per pre-operative assessment

## 2015-06-27 NOTE — Anesthesia Postprocedure Evaluation (Signed)
Anesthesia Post Note  Patient: [name]  Procedure(s) Performed: Procedure(s) (LRB): IRRIGATION AND DEBRIDEMENT EXTREMITY            ( CALF HEMATOMA ) POSSIBLE WOUND VAC (Left) APPLICATION OF WOUND VAC ( POSSIBLE )  (Left)  Patient location during evaluation: PACU Anesthesia Type: General Level of consciousness: awake Pain management: pain level controlled Respiratory status: spontaneous breathing Cardiovascular status: blood pressure returned to baseline Anesthetic complications: no    Last Vitals:  Filed Vitals:   06/27/15 1037 06/27/15 1330  BP: 185/92 131/66  Pulse: 52 63  Temp: 36.3 C 36.6 C  Resp: 16 15    Last Pain:  Filed Vitals:   06/27/15 1336  PainSc: 0-No pain                 VAN STAVEREN,[name]

## 2015-06-27 NOTE — H&P (Signed)
  [facility] History & Physical Update  The patient was interviewed and re-examined.  The patient's previous History and Physical has been reviewed and is unchanged.  There is no change in the plan of care. We plan to proceed with the scheduled procedure.  [name],[name], MD  06/27/2015, 12:10 PM

## 2015-06-27 NOTE — Anesthesia Preprocedure Evaluation (Signed)
Anesthesia Evaluation  Patient identified by MRN, date of birth, ID band Patient awake    Reviewed: Allergy & Precautions, NPO status , Patient's Chart, lab work & pertinent test results  Airway Mallampati: III       Dental  (+) Teeth Intact   Pulmonary sleep apnea ,     + decreased breath sounds      Cardiovascular Exercise Tolerance: Poor hypertension, Pt. on home beta blockers + Peripheral Vascular Disease   Rhythm:Regular     Neuro/Psych Anxiety Depression    GI/Hepatic Neg liver ROS, GERD  ,  Endo/Other  diabetes, Type 2, Oral Hypoglycemic AgentsHypothyroidism Morbid obesity  Renal/GU      Musculoskeletal   Abdominal (+) + obese,   Peds  Hematology   Anesthesia Other Findings   Reproductive/Obstetrics                             Anesthesia Physical Anesthesia Plan  ASA: III  Anesthesia Plan: General   Post-op Pain Management:    Induction: Intravenous  Airway Management Planned: LMA  Additional Equipment:   Intra-op Plan:   Post-operative Plan: Extubation in OR  Informed Consent: I have reviewed the patients History and Physical, chart, labs and discussed the procedure including the risks, benefits and alternatives for the proposed anesthesia with the patient or authorized representative who has indicated his/her understanding and acceptance.     Plan Discussed with: CRNA  Anesthesia Plan Comments:         Anesthesia Quick Evaluation

## 2015-06-29 ENCOUNTER — Encounter: Payer: Self-pay | Admitting: Cardiology

## 2015-06-29 LAB — TYPE AND SCREEN
ABO/RH(D): AB POS
Antibody Screen: POSITIVE
PT AG Type: POSITIVE
Unit division: 0

## 2015-06-29 NOTE — Assessment & Plan Note (Addendum)
No recurrent episodes. It would appear that she converted on a blocker. At this point I would try to avoid antiarrhythmic, but based on her level of symptoms, they'll be the next option. Low risk Myoview stress test would suggest nonischemic etiology.  I will start pill in the pocket flecainide.  Her CHADS2VASC2 score is ~4 --  she has been stable on Pradaxa with no notable bleeding. I did say that if she has another accident like she had word care doctor and leg, she can stop it temporarily to allow for bleeding or bruising to stop.

## 2015-06-29 NOTE — Assessment & Plan Note (Signed)
Negative Myoview. This would suggest nonischemic discomfort. She could just be symptomatic from A. fib.

## 2015-06-29 NOTE — Assessment & Plan Note (Signed)
Stable on Pradaxa

## 2015-06-29 NOTE — Assessment & Plan Note (Signed)
Pretty much right at the upper limit of what I am listing as her goal. She is on beta blocker and ACE inhibitor. We may need to consider gradually titrating up, or adding another agent. I would consider a diuretic since she has had issues with edema.

## 2015-06-29 NOTE — Assessment & Plan Note (Signed)
Thankfully she is not really having a lot of limiting claudication. Just concerned about the healing process at the lesion.

## 2015-06-29 NOTE — Assessment & Plan Note (Signed)
Recent Myoview was negative. This would significantly reduced risk overall. From a cardiovascular risk standpoint.  PREOPERATIVE CARDIAC RISK ASSESSMENT   Revised Cardiac Risk Index:  High Risk Surgery: no; I&D of leg wound  Defined as Intraperitoneal, intrathoracic or suprainguinal vascular  Active CAD: no; Recent Myoview negative & no prior history  CHF: no; edema is more c/w venous stasis  Cerebrovascular Disease: no;   Diabetes: yes; On Insulin: no  CKD (Cr >~ 2): no; <1  Total: 0  Estimated Risk of Adverse Outcome: VERY LOW  Estimated Risk of MI, PE, VF/VT (Cardiac Arrest), Complete Heart Block: <1 %; she does have a history of PAF which would put her at risk for recurrent A. Fib, if this occurs, I would use amiodarone to treat perioperatively. She is on Pradaxa was going to be held 48 hours preprocedure.  As for any other preoperative evaluation, she just had a stress test so no further evaluation is required.

## 2015-06-30 DIAGNOSIS — Z7984 Long term (current) use of oral hypoglycemic drugs: Secondary | ICD-10-CM | POA: Diagnosis not present

## 2015-06-30 DIAGNOSIS — S81801D Unspecified open wound, right lower leg, subsequent encounter: Secondary | ICD-10-CM | POA: Diagnosis not present

## 2015-06-30 DIAGNOSIS — I890 Lymphedema, not elsewhere classified: Secondary | ICD-10-CM | POA: Diagnosis not present

## 2015-06-30 DIAGNOSIS — S81802D Unspecified open wound, left lower leg, subsequent encounter: Secondary | ICD-10-CM | POA: Diagnosis not present

## 2015-06-30 DIAGNOSIS — E119 Type 2 diabetes mellitus without complications: Secondary | ICD-10-CM | POA: Diagnosis not present

## 2015-06-30 DIAGNOSIS — Z7901 Long term (current) use of anticoagulants: Secondary | ICD-10-CM | POA: Diagnosis not present

## 2015-06-30 DIAGNOSIS — F329 Major depressive disorder, single episode, unspecified: Secondary | ICD-10-CM | POA: Diagnosis not present

## 2015-06-30 DIAGNOSIS — J449 Chronic obstructive pulmonary disease, unspecified: Secondary | ICD-10-CM | POA: Diagnosis not present

## 2015-06-30 DIAGNOSIS — I10 Essential (primary) hypertension: Secondary | ICD-10-CM | POA: Diagnosis not present

## 2015-07-05 DIAGNOSIS — I10 Essential (primary) hypertension: Secondary | ICD-10-CM | POA: Diagnosis not present

## 2015-07-05 DIAGNOSIS — J449 Chronic obstructive pulmonary disease, unspecified: Secondary | ICD-10-CM | POA: Diagnosis not present

## 2015-07-05 DIAGNOSIS — S81802D Unspecified open wound, left lower leg, subsequent encounter: Secondary | ICD-10-CM | POA: Diagnosis not present

## 2015-07-05 DIAGNOSIS — E119 Type 2 diabetes mellitus without complications: Secondary | ICD-10-CM | POA: Diagnosis not present

## 2015-07-05 DIAGNOSIS — I890 Lymphedema, not elsewhere classified: Secondary | ICD-10-CM | POA: Diagnosis not present

## 2015-07-05 DIAGNOSIS — S81801D Unspecified open wound, right lower leg, subsequent encounter: Secondary | ICD-10-CM | POA: Diagnosis not present

## 2015-07-05 DIAGNOSIS — Z7901 Long term (current) use of anticoagulants: Secondary | ICD-10-CM | POA: Diagnosis not present

## 2015-07-05 DIAGNOSIS — F329 Major depressive disorder, single episode, unspecified: Secondary | ICD-10-CM | POA: Diagnosis not present

## 2015-07-05 DIAGNOSIS — Z7984 Long term (current) use of oral hypoglycemic drugs: Secondary | ICD-10-CM | POA: Diagnosis not present

## 2015-07-10 ENCOUNTER — Encounter: Payer: PPO | Admitting: Pain Medicine

## 2015-07-11 ENCOUNTER — Other Ambulatory Visit: Payer: Self-pay | Admitting: *Deleted

## 2015-07-11 DIAGNOSIS — L97211 Non-pressure chronic ulcer of right calf limited to breakdown of skin: Secondary | ICD-10-CM | POA: Diagnosis not present

## 2015-07-11 DIAGNOSIS — M7989 Other specified soft tissue disorders: Secondary | ICD-10-CM | POA: Diagnosis not present

## 2015-07-11 DIAGNOSIS — M79609 Pain in unspecified limb: Secondary | ICD-10-CM | POA: Diagnosis not present

## 2015-07-11 DIAGNOSIS — M549 Dorsalgia, unspecified: Secondary | ICD-10-CM | POA: Diagnosis not present

## 2015-07-11 DIAGNOSIS — E119 Type 2 diabetes mellitus without complications: Secondary | ICD-10-CM | POA: Diagnosis not present

## 2015-07-11 DIAGNOSIS — I87091 Postthrombotic syndrome with other complications of right lower extremity: Secondary | ICD-10-CM | POA: Diagnosis not present

## 2015-07-11 DIAGNOSIS — I10 Essential (primary) hypertension: Secondary | ICD-10-CM | POA: Diagnosis not present

## 2015-07-11 DIAGNOSIS — M7981 Nontraumatic hematoma of soft tissue: Secondary | ICD-10-CM | POA: Diagnosis not present

## 2015-07-11 DIAGNOSIS — I890 Lymphedema, not elsewhere classified: Secondary | ICD-10-CM | POA: Diagnosis not present

## 2015-07-11 MED ORDER — METOPROLOL TARTRATE 25 MG PO TABS: 25.0000 mg | ORAL_TABLET | Freq: Two times a day (BID) | ORAL | Status: DC

## 2015-07-12 ENCOUNTER — Ambulatory Visit: Payer: PPO | Attending: Pain Medicine | Admitting: Pain Medicine

## 2015-07-12 ENCOUNTER — Encounter: Payer: Self-pay | Admitting: Pain Medicine

## 2015-07-12 VITALS — BP 136/50 | HR 57 | Temp 98.0°F | Resp 18 | Ht 64.0 in | Wt 260.0 lb

## 2015-07-12 DIAGNOSIS — M25511 Pain in right shoulder: Secondary | ICD-10-CM | POA: Diagnosis not present

## 2015-07-12 DIAGNOSIS — M79606 Pain in leg, unspecified: Secondary | ICD-10-CM | POA: Diagnosis not present

## 2015-07-12 DIAGNOSIS — Z5181 Encounter for therapeutic drug level monitoring: Secondary | ICD-10-CM | POA: Diagnosis not present

## 2015-07-12 DIAGNOSIS — I872 Venous insufficiency (chronic) (peripheral): Secondary | ICD-10-CM | POA: Insufficient documentation

## 2015-07-12 DIAGNOSIS — R61 Generalized hyperhidrosis: Secondary | ICD-10-CM | POA: Diagnosis not present

## 2015-07-12 DIAGNOSIS — F1190 Opioid use, unspecified, uncomplicated: Secondary | ICD-10-CM

## 2015-07-12 DIAGNOSIS — N8110 Cystocele, unspecified: Secondary | ICD-10-CM | POA: Diagnosis not present

## 2015-07-12 DIAGNOSIS — E039 Hypothyroidism, unspecified: Secondary | ICD-10-CM | POA: Insufficient documentation

## 2015-07-12 DIAGNOSIS — N952 Postmenopausal atrophic vaginitis: Secondary | ICD-10-CM | POA: Insufficient documentation

## 2015-07-12 DIAGNOSIS — I480 Paroxysmal atrial fibrillation: Secondary | ICD-10-CM | POA: Diagnosis not present

## 2015-07-12 DIAGNOSIS — I129 Hypertensive chronic kidney disease with stage 1 through stage 4 chronic kidney disease, or unspecified chronic kidney disease: Secondary | ICD-10-CM | POA: Diagnosis not present

## 2015-07-12 DIAGNOSIS — Z7901 Long term (current) use of anticoagulants: Secondary | ICD-10-CM | POA: Diagnosis not present

## 2015-07-12 DIAGNOSIS — E1142 Type 2 diabetes mellitus with diabetic polyneuropathy: Secondary | ICD-10-CM

## 2015-07-12 DIAGNOSIS — J4540 Moderate persistent asthma, uncomplicated: Secondary | ICD-10-CM | POA: Insufficient documentation

## 2015-07-12 DIAGNOSIS — G4700 Insomnia, unspecified: Secondary | ICD-10-CM | POA: Insufficient documentation

## 2015-07-12 DIAGNOSIS — Z86718 Personal history of other venous thrombosis and embolism: Secondary | ICD-10-CM | POA: Diagnosis not present

## 2015-07-12 DIAGNOSIS — Z90710 Acquired absence of both cervix and uterus: Secondary | ICD-10-CM | POA: Diagnosis not present

## 2015-07-12 DIAGNOSIS — G8929 Other chronic pain: Secondary | ICD-10-CM

## 2015-07-12 DIAGNOSIS — F418 Other specified anxiety disorders: Secondary | ICD-10-CM | POA: Insufficient documentation

## 2015-07-12 DIAGNOSIS — Z79891 Long term (current) use of opiate analgesic: Secondary | ICD-10-CM | POA: Diagnosis not present

## 2015-07-12 DIAGNOSIS — G4701 Insomnia due to medical condition: Secondary | ICD-10-CM

## 2015-07-12 DIAGNOSIS — K219 Gastro-esophageal reflux disease without esophagitis: Secondary | ICD-10-CM | POA: Insufficient documentation

## 2015-07-12 DIAGNOSIS — M25561 Pain in right knee: Secondary | ICD-10-CM | POA: Insufficient documentation

## 2015-07-12 MED ORDER — GABAPENTIN 800 MG PO TABS: 800.0000 mg | ORAL_TABLET | Freq: Four times a day (QID) | ORAL | Status: DC

## 2015-07-12 MED ORDER — HYDROCODONE-ACETAMINOPHEN 5-325 MG PO TABS: 1.0000 | ORAL_TABLET | Freq: Three times a day (TID) | ORAL | Status: DC | PRN

## 2015-07-12 MED ORDER — MELATONIN 10 MG PO CAPS: 20.0000 mg | ORAL_CAPSULE | Freq: Every evening | ORAL | Status: DC | PRN

## 2015-07-12 MED ORDER — DULOXETINE HCL 60 MG PO CPEP: 60.0000 mg | ORAL_CAPSULE | Freq: Every day | ORAL | Status: DC

## 2015-07-12 NOTE — Patient Instructions (Signed)
Pain Management Discharge Instructions  General Discharge Instructions :  If you need to reach your doctor call: Monday-Friday 8:00 am - 4:00 pm at [phone] or toll free [phone].  After clinic hours [phone] to have operator reach doctor.  Bring all of your medication bottles to all your appointments in the pain clinic.  To cancel or reschedule your appointment with Pain Management please remember to call 24 hours in advance to avoid a fee.  Refer to the educational materials which you have been given on: General Risks, I had my Procedure. Discharge Instructions, Post Sedation.  Post Procedure Instructions:  The drugs you were given will stay in your system until tomorrow, so for the next 24 hours you should not drive, make any legal decisions or drink any alcoholic beverages.  You may eat anything you prefer, but it is better to start with liquids then soups and crackers, and gradually work up to solid foods.  Please notify your doctor immediately if you have any unusual bleeding, trouble breathing or pain that is not related to your normal pain.  Depending on the type of procedure that was done, some parts of your body may feel week and/or numb.  This usually clears up by tonight or the next day.  Walk with the use of an assistive device or accompanied by an adult for the 24 hours.  You may use ice on the affected area for the first 24 hours.  Put ice in a Ziploc bag and cover with a towel and place against area 15 minutes on 15 minutes off.  You may switch to heat after 24 hours. 

## 2015-07-12 NOTE — Progress Notes (Signed)
Patient's Name: [name]  Patient type: Established  MRN: [id number]  Service setting: Ambulatory outpatient  DOB: 1950-12-18  Location: [facility]  DOS: 07/12/2015  Primary Care Physician: Dawn Lime, MD  Note by: [name]. [name], M.D, DABA, DABAPM, DABPM, DABIPP, FIPP  Referring Physician: Bobetta Lime, MD  Specialty: Board-Certified Interventional Pain Management     Primary Reason(s) for Visit: Encounter for prescription drug management (Level of risk: moderate) CC: Leg Pain   HPI  [name] is a 65 y.o. year old, female patient, who returns today as an established patient. She has Paroxysmal atrial fibrillation ([place]); Hypertension goal BP (blood pressure) < 150/90; Hyperhidrosis - unclear etiology; Venous stasis dermatitis of both lower extremities; Peripheral vascular disease due to secondary diabetes mellitus ([place]); Apnea, sleep; DM (diabetes mellitus) type II controlled peripheral vascular disorder ([place]); Diabetic peripheral neuropathy associated with type 2 diabetes mellitus ([place]); Asthma, moderate persistent, well-controlled; Hypothyroidism, adult; Anxiety and depression; Chronic kidney disease; Allergic contact dermatitis; Bladder cystocele; Gastro-esophageal reflux disease without esophagitis; History of cellulitis; H/O deep venous thrombosis; Personal history of surgery to heart and great vessels, presenting hazards to health; H/O fall; Cannot sleep; Neuropathy involving both lower extremities ([place]); Extreme obesity ([place]); Hernia, rectovaginal; Vascular disorder of lower extremity; Candidal dermatitis; Dryness of vagina; Ulcer of lower extremity ([place]); Major depression, recurrent, chronic ([place]); Marital problems; Comprehensive diabetic foot examination, type 2 DM, encounter for [facility]); Mixed incontinence; Atrophic vaginitis; Myelolipoma of right adrenal gland; Edema of right lower extremity; Atherosclerotic PVD with ulceration ([place]); Ulcer of  toe of left foot ([place]); Chronic pain; Long term current use of opiate analgesic; Long term prescription opiate use; Opiate use; Encounter for therapeutic drug level monitoring; Lateral femoral cutaneous entrapment syndrome; Foot pain (Bilateral); Chronic shoulder pain (Right); Diabetic peripheral neuropathy ([place]); Abrasion of right lower extremity; Antibiotic-induced yeast infection; Insomnia secondary to chronic pain; Edema of left foot; Diabetic foot (HCC) (Left); Chronic anticoagulation ; Chest pain with low risk for cardiac etiology; Accidental fall; Right anterior knee pain; Pain in tibia; Injury of nose; Blunt trauma of nose; and Preoperative cardiovascular examination on her problem list.. Her primarily concern today is the Leg Pain   Pain Assessment: Self-Reported Pain Score: 5  Reported level is compatible with observation Pain Type: Chronic pain Pain Location: Leg Pain Orientation: Left, Right Pain Descriptors / Indicators: Sore, Aching Pain Frequency: Constant  The patient comes into the clinics today for pharmacological management of her chronic pain. The patient recently had a surgery to remove a large hematoma from her left leg. Specifically the hematoma seemed to have been in the area of the shin. Surgery was done on 06/27/2015.  Date of Last Visit: 04/17/15 Service Provided on Last Visit: Med Refill  Controlled Substance Pharmacotherapy Assessment & REMS (Risk Evaluation and Mitigation Strategy)  Analgesic: Hydrocodone/APAP 5/325 one tablet every 8 hours (15 mg/day) Pill Count: Pills remaining: Pt did not bring today- states she has about 14 pills remaining  Pt fell on 05/22/15 believes she tore right rotator cuff- [name] following pt MME/day: 15 mg/day Date of Last Visit: 04/17/15 Pharmacokinetics: Onset of action (Liberation/Absorption): Within expected pharmacological parameters Time to Peak effect (Distribution): Timing and results are as within normal expected  parameters Duration of action (Metabolism/Excretion): Within normal limits for medication Pharmacodynamics: Analgesic Effect: More than 50% Activity Facilitation: Medication(s) allow patient to sit, stand, walk, and do the basic ADLs Perceived Effectiveness: Described as relatively effective, allowing for increase in activities of daily living (  ADL) Side-effects or Adverse reactions: None reported Monitoring: [place] PMP: Online review of the past 32-month period conducted. Compliant with practice rules and regulations UDS Results/interpretation: The patient's last UDS was done on 04/17/2015 and it came back abnormal due to the fact that they have declared hydrocodone and alprazolam was not detected in the sample. Medication Assessment Form: Reviewed. Patient indicates being compliant with therapy Treatment compliance: Compliant Risk Assessment: Aberrant Behavior: None observed today Substance Use Disorder (SUD) Risk Level: No change since last visit Risk of opioid abuse or dependence: 0.7-3.0% with doses ? 36 MME/day and 6.1-26% with doses ? 120 MME/day. Opioid Risk Tool (ORT) Score: Total Score: 0 Low Risk for SUD (Score <3) Depression Scale Score: PHQ-2: PHQ-2 Total Score: 0 No depression (0) PHQ-9: PHQ-9 Total Score: 0 No depression (0-4)  Pharmacologic Plan: No change in therapy, at this time  Laboratory Chemistry  Inflammation Markers Lab Results  Component Value Date   ESRSEDRATE 12 04/17/2015   CRP 0.6 04/17/2015    Renal Function Lab Results  Component Value Date   BUN 21* 06/24/2015   CREATININE 0.76 06/24/2015   GFRAA >60 06/24/2015   GFRNONAA >60 06/24/2015    Hepatic Function Lab Results  Component Value Date   AST 16 04/17/2015   ALT 15 04/17/2015   ALBUMIN 4.3 04/17/2015    Electrolytes Lab Results  Component Value Date   NA 136 06/24/2015   K 4.3 06/24/2015   CL 103 06/24/2015   CALCIUM 9.0 06/24/2015   MG 1.8 04/17/2015    Pain Modulating  Vitamins No results found for: VD25OH, VD125OH2TOT, [id number], [id number], VITAMINB12  Coagulation Parameters Lab Results  Component Value Date   INR 1.24 06/24/2015   LABPROT 15.8* 06/24/2015    Note: I personally reviewed the above data. Results shared with patient.  Meds  The patient has a current medication list which includes the following prescription(s): albuterol, albuterol, alprazolam, bupropion, duloxetine, estrace vaginal, flecainide, gabapentin, hydrocodone-acetaminophen, hydrocodone-acetaminophen, levothyroxine, melatonin, metoprolol tartrate, mirabegron er, montelukast, pantoprazole, quinapril, and hydrocodone-acetaminophen.  Current Outpatient Prescriptions on File Prior to Visit  Medication Sig  . albuterol (PROVENTIL HFA;VENTOLIN HFA) 108 (90 Base) MCG/ACT inhaler Inhale 1-2 puffs into the lungs every 4 (four) hours as needed for wheezing or shortness of breath.  Marland Kitchen albuterol (PROVENTIL) (2.5 MG/3ML) 0.083% nebulizer solution Take 3 mLs (2.5 mg total) by nebulization every 6 (six) hours as needed for wheezing or shortness of breath.  . ALPRAZolam (XANAX) 0.5 MG tablet TAKE ONE TABLET TWICE DAILY AS NEEDED  . buPROPion (WELLBUTRIN XL) 300 MG 24 hr tablet TAKE ONE TABLET BY MOUTH EVERY DAY  . ESTRACE VAGINAL 0.1 MG/GM vaginal cream INSERT 1 GRAM VAGINALLY 2 TIMES A WEEK  . flecainide (TAMBOCOR) 100 MG tablet Take 2 tablets (200 mg total) by mouth as needed (for afib. May take additional 100mg  flecainide six hours later if symptoms persist.).  Marland Kitchen levothyroxine (SYNTHROID, LEVOTHROID) 150 MCG tablet TAKE ONE TABLET EVERY MORNING  . metoprolol tartrate (LOPRESSOR) 25 MG tablet Take 1 tablet (25 mg total) by mouth 2 (two) times daily.  . mirabegron ER (MYRBETRIQ) 25 MG TB24 tablet Take 1 tablet (25 mg total) by mouth daily.  . montelukast (SINGULAIR) 10 MG tablet TAKE ONE TABLET EVERY DAY  . pantoprazole (PROTONIX) 40 MG tablet Take 1 tablet (40 mg total) by mouth daily.  Caution:prolonged use may increase risk of pneumonia, colitis, osteoporosis, anemia  . quinapril (ACCUPRIL) 20 MG tablet TAKE ONE TABLET BY MOUTH EVERY DAY  No current facility-administered medications on file prior to visit.    ROS  Constitutional: Afebrile, no chills, well hydrated and well nourished Gastrointestinal: No upper or lower GI bleeding, no nausea, no vomiting and no acute GI distress Musculoskeletal: No acute joint swelling or redness, no acute loss of range of motion and no acute onset weakness Neurological: Denies any acute onset apraxia, no episodes of paralysis, no acute loss of coordination, no acute loss of consciousness and no acute onset aphasia, dysarthria, agnosia, or amnesia  Allergies  Ms. Nedrow is allergic to aspirin; penicillins; and terramycin.  [place]  Medical:  Ms. Hupp  has a past medical history of Sinus problem; Swelling; Thyroid disease; Diabetes ([place]); Venous stasis dermatitis of both lower extremities; blood clots; HBP (high blood pressure); Kidney disease; Depression; Dysthymic disorder; Paroxysmal atrial fibrillation ([place]) (2007); Asthma; GERD (gastroesophageal reflux disease); Heart murmur; DVT (deep venous thrombosis) ([place]); Insomnia; Atrial fibrillation, controlled ([place]); Cystocele; Neuropathy involving both lower extremities ([place]); Anxiety and depression; Obesity, Class II, BMI 35-39.9, with comorbidity ([place]); Cholecystolithiasis; Vaginitis; Kidney stone; Adrenal mass ([place]); Sleep apnea; Reflux; Anxiety; and Arthritis. Family: family history includes Breast cancer in her maternal aunt; Cancer in her father and maternal aunt; Cerebral aneurysm in her father; Diabetes in her father; Hypertension in her father; Kidney disease in her mother; Mental illness in her sister; Peripheral vascular disease in her father; Skin cancer in her father; Varicose Veins in her mother. Surgical:  has past surgical history that includes Hernia repair; Ankle surgery  (Right); Vaginal hysterectomy; Sleeve Gastroplasty; Lithotripsy; Kidney surgery; transthoracic echocardiogram (08/03/2013); transesophageal echocardiogram (08/08/2013); Tonsillectomy; Total knee arthroplasty; Hiatal hernia repair; Joint replacement; Cholecystectomy; I&D extremity (Left, 06/27/2015); Application if wound vac (Left, 06/27/2015); and eremoval of left hematoma. Tobacco:  reports that she has never smoked. She has never used smokeless tobacco. Alcohol:  reports that she does not drink alcohol. Drug:  reports that she does not use illicit drugs.  Physical Examination  Constitutional Vitals: Blood pressure 136/50, pulse 57, temperature 98 F (36.7 C), resp. rate 18, height 5\' 4"  (1.626 m), weight 260 lb (117.935 kg), SpO2 98 %. Calculated BMI: Body mass index is 44.61 kg/(m^2). (>40 kg/m2) Extreme obesity (Class III) - 254% higher incidence of chronic pain General appearance: Alert, oriented x 3, in no apparent acute distress. Well nourished, well hydrated Eyes: PERLA Respiratory: No evidence respiratory distress, no audible rales or ronchi and no use of accessory muscles of respiration Psych: Alert, oriented to person, oriented to place and oriented to time  Cervical Spine Exam  Inspection: Normal anatomy, no anomalies observed Cervical Lordosis: Normal Alignment: Symetrical Functional ROM: Within functional limits [facility]) AROM: WFL Sensory: No sensory anomalies reported or detected  Upper Extremity Exam    Right  Left  Inspection: No gross anomalies detected  Inspection: No gross anomalies detected  Functional ROM: Adequate  Functional ROM: Adequate  AROM: Adequate  AROM: Adequate  Sensory: No sensory anomalies reported or detected  Sensory: No sensory anomalies reported or detected  Motor: Unremarkable  Motor: Unremarkable  Vascular: Normal skin color, temperature, and hair growth. No peripheral edema or cyanosis  Vascular: Normal skin color, temperature, and hair growth. No  peripheral edema or cyanosis   Thoracic Spine  Inspection: No gross anomalies detected Alignment: Symetrical Functional ROM: Within functional limits [facility]) AROM: Adequate Palpation: WNL  Lumbar Spine  Inspection: No gross anomalies detected Alignment: Symetrical Functional ROM: Within functional limits [facility]) AROM: Adequate Sensory: No sensory anomalies reported or detected Palpation: WNL  Provocative Tests: Lumbar Hyperextension and rotation test: deferred Patrick's Maneuver: deferred  Gait Assessment  Gait: Antalgic (limping)  Lower Extremities    Right  Left  Inspection: No gross anomalies detected  Inspection: Clear evidence of some recent surgical intervention on the distal left leg.  Functional ROM: Within functional limits [facility])  Functional ROM: Within functional limits [facility])  AROM: Adequate  AROM: Adequate  Sensory: No sensory anomalies reported or detected  Sensory: No sensory anomalies reported or detected  Motor: Unremarkable  Motor: Unremarkable  Toe walk (S1): WNL  Toe walk (S1): WNL  Heal walk (L5): WNL  Heal walk (L5): WNL   Assessment & Plan  Primary Diagnosis & Pertinent Problem List: The primary encounter diagnosis was Chronic pain. Diagnoses of Encounter for therapeutic drug level monitoring, Long term current use of opiate analgesic, Diabetic peripheral neuropathy ([place]), Depression with anxiety, and Insomnia secondary to chronic pain were also pertinent to this visit.  Visit Diagnosis: 1. Chronic pain   2. Encounter for therapeutic drug level monitoring   3. Long term current use of opiate analgesic   4. Diabetic peripheral neuropathy ([place])   5. Depression with anxiety   6. Insomnia secondary to chronic pain     Problems updated and reviewed during this visit: No problems updated.  Problem-specific Plan(s): No problem-specific assessment & plan notes found for this encounter.  No new assessment & plan notes have been filed under this hospital  service since the last note was generated. Service: Pain Management   Plan of Care   Problem List Items Addressed This Visit      High   Chronic pain - Primary (Chronic)   Relevant Medications   gabapentin (NEURONTIN) 800 MG tablet   DULoxetine (CYMBALTA) 60 MG capsule   HYDROcodone-acetaminophen (NORCO/VICODIN) 5-325 MG tablet   HYDROcodone-acetaminophen (NORCO) 5-325 MG tablet   HYDROcodone-acetaminophen (NORCO/VICODIN) 5-325 MG tablet   Diabetic peripheral neuropathy (HCC) (Chronic)   Relevant Medications   gabapentin (NEURONTIN) 800 MG tablet   DULoxetine (CYMBALTA) 60 MG capsule     Medium   Encounter for therapeutic drug level monitoring   Long term current use of opiate analgesic (Chronic)   Relevant Orders   ToxASSURE Select 13 (MW), Urine     Low   Insomnia secondary to chronic pain (Chronic)   Relevant Medications   gabapentin (NEURONTIN) 800 MG tablet   DULoxetine (CYMBALTA) 60 MG capsule   Melatonin 10 MG CAPS   HYDROcodone-acetaminophen (NORCO/VICODIN) 5-325 MG tablet   HYDROcodone-acetaminophen (NORCO) 5-325 MG tablet   HYDROcodone-acetaminophen (NORCO/VICODIN) 5-325 MG tablet    Other Visit Diagnoses    Depression with anxiety        Relevant Medications    DULoxetine (CYMBALTA) 60 MG capsule        Pharmacotherapy (Medications Ordered): Meds ordered this encounter  Medications  . gabapentin (NEURONTIN) 800 MG tablet    Sig: Take 1 tablet (800 mg total) by mouth every 6 (six) hours.    Dispense:  120 tablet    Refill:  2    Do not place this medication, or any other prescription from our practice, on "Automatic Refill". Patient may have prescription filled one day early if pharmacy is closed on scheduled refill date.  . DULoxetine (CYMBALTA) 60 MG capsule    Sig: Take 1 capsule (60 mg total) by mouth daily.    Dispense:  30 capsule    Refill:  2    Do not place this medication, or any other  prescription from our practice, on "Automatic Refill".  Patient may have prescription filled one day early if pharmacy is closed on scheduled refill date.  . Melatonin 10 MG CAPS    Sig: Take 20 mg by mouth at bedtime as needed.    Dispense:  60 capsule    Refill:  2    Do not place this medication, or any other prescription from our practice, on "Automatic Refill". Patient may have prescription filled one day early if pharmacy is closed on scheduled refill date.  Marland Kitchen HYDROcodone-acetaminophen (NORCO/VICODIN) 5-325 MG tablet    Sig: Take 1 tablet by mouth every 8 (eight) hours as needed for severe pain.    Dispense:  50 tablet    Refill:  0    Do not add this medication to the electronic "Automatic Refill" notification system. Patient may have prescription filled one day early if pharmacy is closed on scheduled refill date. Do not fill until: 09/11/15 To last until: 10/11/15  . HYDROcodone-acetaminophen (NORCO) 5-325 MG tablet    Sig: Take 1 tablet by mouth every 8 (eight) hours as needed for severe pain.    Dispense:  50 tablet    Refill:  0    Do not add this medication to the electronic "Automatic Refill" notification system. Patient may have prescription filled one day early if pharmacy is closed on scheduled refill date. Do not fill until: 07/13/15 To last until: 08/12/15  . HYDROcodone-acetaminophen (NORCO/VICODIN) 5-325 MG tablet    Sig: Take 1 tablet by mouth every 8 (eight) hours as needed for severe pain.    Dispense:  50 tablet    Refill:  0    Do not add this medication to the electronic "Automatic Refill" notification system. Patient may have prescription filled one day early if pharmacy is closed on scheduled refill date. Do not fill until: 08/12/15 To last until: 09/11/15    [facility] & Procedure Ordered: Orders Placed This Encounter  Procedures  . ToxASSURE Select 13 (MW), Urine    Imaging Ordered: None  Interventional Therapies: Scheduled:  None at this time.    Considering:  None at this time.    PRN Procedures:   None at this time.    Referral(s) or Consult(s): None at this time.  New Prescriptions   HYDROCODONE-ACETAMINOPHEN (NORCO/VICODIN) 5-325 MG TABLET    Take 1 tablet by mouth every 8 (eight) hours as needed for severe pain.    Medications administered during this visit: Ms. Kellman had no medications administered during this visit.  Future Appointments Date Time Provider [place]  07/26/2015 11:45 AM [facility]  09/30/2015 11:00 AM Milinda Pointer, MD [facility] None    Primary Care Physician: Dawn Lime, MD Location: [facility] Note by: [name]. [name], M.D, DABA, DABAPM, DABPM, DABIPP, FIPP  Pain Score Disclaimer: We use the NRS-11 scale. This is a self-reported, subjective measurement of pain severity with only modest accuracy. It is used primarily to identify changes within a particular patient. It must be understood that outpatient pain scales are significantly less accurate that those used for research, where they can be applied under ideal controlled circumstances with minimal exposure to variables. In reality, the score is likely to be a combination of pain intensity and pain affect, where pain affect describes the degree of emotional arousal or changes in action readiness caused by the sensory experience of pain. Factors such as social and work situation, setting, emotional state, anxiety levels, expectation, and prior pain  experience may influence pain perception and show large inter-individual differences that may also be affected by time variables.

## 2015-07-12 NOTE — Progress Notes (Signed)
Safety precautions to be maintained throughout the outpatient stay will include: orient to surroundings, keep bed in low position, maintain call bell within reach at all times, provide assistance with transfer out of bed and ambulation.  Surgery to remove large hematoma from left leg/shin 06/27/15 Pills remaining: Pt did not bring today- states she has about 14 pills remaining Pt fell on 05/22/15 believes she tore right rotator cuff- Dr Sabra Heck following pt

## 2015-07-15 ENCOUNTER — Telehealth: Payer: Self-pay | Admitting: Cardiology

## 2015-07-15 ENCOUNTER — Other Ambulatory Visit: Payer: Self-pay | Admitting: Orthopedic Surgery

## 2015-07-15 DIAGNOSIS — I10 Essential (primary) hypertension: Secondary | ICD-10-CM | POA: Diagnosis not present

## 2015-07-15 DIAGNOSIS — M7541 Impingement syndrome of right shoulder: Secondary | ICD-10-CM

## 2015-07-15 DIAGNOSIS — I890 Lymphedema, not elsewhere classified: Secondary | ICD-10-CM | POA: Diagnosis not present

## 2015-07-15 DIAGNOSIS — F329 Major depressive disorder, single episode, unspecified: Secondary | ICD-10-CM | POA: Diagnosis not present

## 2015-07-15 DIAGNOSIS — S81802D Unspecified open wound, left lower leg, subsequent encounter: Secondary | ICD-10-CM | POA: Diagnosis not present

## 2015-07-15 DIAGNOSIS — S81801D Unspecified open wound, right lower leg, subsequent encounter: Secondary | ICD-10-CM | POA: Diagnosis not present

## 2015-07-15 DIAGNOSIS — E119 Type 2 diabetes mellitus without complications: Secondary | ICD-10-CM | POA: Diagnosis not present

## 2015-07-15 DIAGNOSIS — Z7901 Long term (current) use of anticoagulants: Secondary | ICD-10-CM | POA: Diagnosis not present

## 2015-07-15 DIAGNOSIS — J449 Chronic obstructive pulmonary disease, unspecified: Secondary | ICD-10-CM | POA: Diagnosis not present

## 2015-07-15 DIAGNOSIS — Z7984 Long term (current) use of oral hypoglycemic drugs: Secondary | ICD-10-CM | POA: Diagnosis not present

## 2015-07-15 NOTE — Telephone Encounter (Signed)
Spoke with michelle, when she saw the pt at 6 pm on Friday the pt c/o sleeping all day and fatigue. The pt reported her bp earlier in the day as 108/40, michelle got 140/70. Today the pt feels a lot better, but did not take the metoprolol Saturday or Sunday. Today bp was 130/90 with pulse 64. Sharyn Lull will see the pt again on Wednesday, the pt does not have a way to check her bp at home. Will make dr harding aware.

## 2015-07-15 NOTE — Telephone Encounter (Signed)
New message     Pt c/o BP issue: STAT if pt c/o blurred vision, one-sided weakness or slurred speech  1. What are your last 5 BP readings? 104/40 last Friday at [place], 140/70 at 6pm friday 2. Are you having any other symptoms (ex. Dizziness, headache, blurred vision, passed out)? Dizziness, headache and feeling tired friday 3. What is your BP issue?  Pt did not take her metoprolol over the weekend--bp today 130/90 HR 64--no dizziness or headache or fatigue today.  Please advise

## 2015-07-15 NOTE — Telephone Encounter (Signed)
IF BP is low - would not take ACE-I.  Otherwise = lets try 1/2 dose of Metoprolol  HARDING, Leonie Green, MD

## 2015-07-16 ENCOUNTER — Encounter: Payer: Self-pay | Admitting: *Deleted

## 2015-07-16 NOTE — Telephone Encounter (Signed)
Spoke with pt, aware of dr harding's recommendations. Pt understands to take 1/2 metoprolol twice daily and to not take the quinipril if bp is low.

## 2015-07-17 ENCOUNTER — Telehealth: Payer: Self-pay | Admitting: Family Medicine

## 2015-07-17 ENCOUNTER — Telehealth: Payer: Self-pay

## 2015-07-17 MED ORDER — BUTOCONAZOLE NITRATE (1 DOSE) 2 % VA CREA: 1.0000 | TOPICAL_CREAM | Freq: Every day | VAGINAL | Status: AC

## 2015-07-17 NOTE — Telephone Encounter (Signed)
I really would prefer not using oral diflucan; I talked with patient at length about this; please see if another pharmacy has in stock and we can transfer Rx there

## 2015-07-17 NOTE — Telephone Encounter (Signed)
I talked with pharmacist first; he agrees there is a level 2 drug-drug interaction between flecainide and fluconazole; will use butoconazole instead I talked with patient; she has flecainide on-hand to use if spell hits her, but not taking regularly; I'd rather use topical, 3 days instead of regular 6 days Yogurt, probiotics discussed Can re-visit the fluconazole if needed, but would rather try topical

## 2015-07-17 NOTE — Telephone Encounter (Signed)
Pt said that she is having a hard raging yeast infection and she has tried [employer], and [employer] ( sorry about the spelling). She said that it has not done any good and needs the pill diflucan. She had surgery and dr Nadine Counts usually gave her 2 one to take then and then 1 two to three days later and that always worked for her.

## 2015-07-17 NOTE — Telephone Encounter (Signed)
Pharmacy called states they will not have the yeast infection cream until tomorrow.  Patient insisted she needed something today, wants to see about getting oral diflucan in stead?

## 2015-07-18 ENCOUNTER — Other Ambulatory Visit: Payer: Self-pay | Admitting: Family Medicine

## 2015-07-18 DIAGNOSIS — I87091 Postthrombotic syndrome with other complications of right lower extremity: Secondary | ICD-10-CM | POA: Diagnosis not present

## 2015-07-18 DIAGNOSIS — M7989 Other specified soft tissue disorders: Secondary | ICD-10-CM | POA: Diagnosis not present

## 2015-07-18 DIAGNOSIS — L97211 Non-pressure chronic ulcer of right calf limited to breakdown of skin: Secondary | ICD-10-CM | POA: Diagnosis not present

## 2015-07-18 DIAGNOSIS — I10 Essential (primary) hypertension: Secondary | ICD-10-CM | POA: Diagnosis not present

## 2015-07-18 DIAGNOSIS — M549 Dorsalgia, unspecified: Secondary | ICD-10-CM | POA: Diagnosis not present

## 2015-07-18 DIAGNOSIS — M79609 Pain in unspecified limb: Secondary | ICD-10-CM | POA: Diagnosis not present

## 2015-07-18 DIAGNOSIS — E119 Type 2 diabetes mellitus without complications: Secondary | ICD-10-CM | POA: Diagnosis not present

## 2015-07-18 DIAGNOSIS — M7981 Nontraumatic hematoma of soft tissue: Secondary | ICD-10-CM | POA: Diagnosis not present

## 2015-07-18 DIAGNOSIS — I890 Lymphedema, not elsewhere classified: Secondary | ICD-10-CM | POA: Diagnosis not present

## 2015-07-18 NOTE — Telephone Encounter (Signed)
I don't have any record of seeing this patient on Friday I would need to see her for this to discuss the ambien refill, but I won't guarantee a refill; she is on other controlled substances, and I don't think it's a good idea that she take this This is a controlled substance and I would love to get her off of this and discuss other options for dealing with insomnia If someone else prescribes her Lorrin Mais, please have her contact that office For the pantoprazole, please ask her if she is aware of possible serious health risks associated with this medicine I'd like to get her off of this too; I'll allow just one month, but we'll talk about that at her appt too

## 2015-07-18 NOTE — Telephone Encounter (Signed)
Pt notified will call back to schedule an appt and she actually recieves the ambien from her pain dr.

## 2015-07-18 NOTE — Telephone Encounter (Signed)
Needs Ambien called in please, did not get when she came to appt on Friday, please let patient know when this is done  [phone]

## 2015-07-19 ENCOUNTER — Other Ambulatory Visit: Payer: Self-pay

## 2015-07-19 ENCOUNTER — Telehealth: Payer: Self-pay | Admitting: Cardiology

## 2015-07-19 DIAGNOSIS — J4540 Moderate persistent asthma, uncomplicated: Secondary | ICD-10-CM

## 2015-07-19 LAB — TOXASSURE SELECT 13 (MW), URINE: PDF: 0

## 2015-07-19 NOTE — Telephone Encounter (Signed)
Nurse from [facility] Saw pt Wednesday she was told that If pt BP high to take metoprolol if its low then not to take it  Would like some some examples of the high's and lows Please call back with that.

## 2015-07-19 NOTE — Telephone Encounter (Signed)
[facility] nurse is requesting parameters for when pt should take metoprolol.   Attempted to call back, but no answer, as it is after business hours.

## 2015-07-20 MED ORDER — LEVOTHYROXINE SODIUM 150 MCG PO TABS: 150.0000 ug | ORAL_TABLET | Freq: Every morning | ORAL | Status: DC

## 2015-07-20 MED ORDER — MONTELUKAST SODIUM 10 MG PO TABS: 10.0000 mg | ORAL_TABLET | Freq: Every day | ORAL | Status: DC

## 2015-07-20 NOTE — Telephone Encounter (Signed)
Last TSH reviewed, July 2016; normal; will be due for repeat thyroid check in July

## 2015-07-23 NOTE — Telephone Encounter (Signed)
Left message on machine for Lucina Mellow at [facility]  to contact the office.

## 2015-07-25 ENCOUNTER — Telehealth: Payer: Self-pay

## 2015-07-25 ENCOUNTER — Telehealth: Payer: Self-pay | Admitting: *Deleted

## 2015-07-25 DIAGNOSIS — F329 Major depressive disorder, single episode, unspecified: Secondary | ICD-10-CM | POA: Diagnosis not present

## 2015-07-25 DIAGNOSIS — E119 Type 2 diabetes mellitus without complications: Secondary | ICD-10-CM | POA: Diagnosis not present

## 2015-07-25 DIAGNOSIS — I890 Lymphedema, not elsewhere classified: Secondary | ICD-10-CM | POA: Diagnosis not present

## 2015-07-25 DIAGNOSIS — J449 Chronic obstructive pulmonary disease, unspecified: Secondary | ICD-10-CM | POA: Diagnosis not present

## 2015-07-25 DIAGNOSIS — S81801D Unspecified open wound, right lower leg, subsequent encounter: Secondary | ICD-10-CM | POA: Diagnosis not present

## 2015-07-25 DIAGNOSIS — S81802D Unspecified open wound, left lower leg, subsequent encounter: Secondary | ICD-10-CM | POA: Diagnosis not present

## 2015-07-25 DIAGNOSIS — Z7984 Long term (current) use of oral hypoglycemic drugs: Secondary | ICD-10-CM | POA: Diagnosis not present

## 2015-07-25 DIAGNOSIS — I10 Essential (primary) hypertension: Secondary | ICD-10-CM | POA: Diagnosis not present

## 2015-07-25 DIAGNOSIS — Z7901 Long term (current) use of anticoagulants: Secondary | ICD-10-CM | POA: Diagnosis not present

## 2015-07-25 NOTE — Telephone Encounter (Signed)
Spoke with patients husband, that [name] is no longer prescribed by Dr Dossie Arbour.   Melatonin has been escribed to pharmacy for rest.

## 2015-07-25 NOTE — Telephone Encounter (Signed)
Attempted to call patient regarding message she left about not receiving some medications at her last appointment.  Left her a message to call us back.

## 2015-07-25 NOTE — Telephone Encounter (Signed)
Spoke with patients husband, [name].  The Rx in question was for [place]. Informed that Dr Dossie Arbour is no longer prescribing Ambien,  Melatonin 10 mg has been escribed to pharmacy.  Patients husband verbalizes u/o information.

## 2015-07-25 NOTE — Telephone Encounter (Signed)
RETURNED THE PT CALL LM ON VM MADE HER AWARE THAT I WOULD ROUTE THIS MESSAGE TO THE NURSES STATION  ABOUT HER NEEDING HER PRESCRIPTIONS THAT SHE DID NOT RECEIVE ON 07/19/2015.Marland KitchenMarland KitchenMarland KitchenTD

## 2015-07-26 ENCOUNTER — Telehealth: Payer: Self-pay | Admitting: Cardiovascular Disease

## 2015-07-26 ENCOUNTER — Ambulatory Visit
Admission: RE | Admit: 2015-07-26 | Discharge: 2015-07-26 | Disposition: A | Discharge status: Home / Self Care | Payer: PPO | Source: Ambulatory Visit | Attending: Orthopedic Surgery | Admitting: Orthopedic Surgery

## 2015-07-26 DIAGNOSIS — M19011 Primary osteoarthritis, right shoulder: Secondary | ICD-10-CM | POA: Insufficient documentation

## 2015-07-26 DIAGNOSIS — S46219A Strain of muscle, fascia and tendon of other parts of biceps, unspecified arm, initial encounter: Secondary | ICD-10-CM | POA: Diagnosis not present

## 2015-07-26 DIAGNOSIS — X58XXXA Exposure to other specified factors, initial encounter: Secondary | ICD-10-CM | POA: Diagnosis not present

## 2015-07-26 DIAGNOSIS — M75101 Unspecified rotator cuff tear or rupture of right shoulder, not specified as traumatic: Secondary | ICD-10-CM | POA: Diagnosis not present

## 2015-07-26 DIAGNOSIS — M7541 Impingement syndrome of right shoulder: Secondary | ICD-10-CM

## 2015-07-26 NOTE — Telephone Encounter (Signed)
S/w Lucina Mellow from [facility] who had called earlier regarding parameters for pt BP meds. Pt takes 1/2 metoprolol BID and quinapril daily.  She was instructed to hold quinapril if BP low. Sharyn Lull calling for pt for parameters for holding quinapril. Reviewed BP that would require holding meds till next dose. Sharyn Lull verbalized understanding w/no further questions.

## 2015-07-30 ENCOUNTER — Encounter: Payer: Self-pay | Admitting: Family Medicine

## 2015-07-30 ENCOUNTER — Other Ambulatory Visit: Payer: Self-pay

## 2015-07-30 ENCOUNTER — Ambulatory Visit: Payer: Self-pay | Admitting: Family Medicine

## 2015-07-30 ENCOUNTER — Ambulatory Visit (INDEPENDENT_AMBULATORY_CARE_PROVIDER_SITE_OTHER): Payer: PPO | Admitting: Family Medicine

## 2015-07-30 VITALS — BP 126/84 | HR 62 | Resp 16 | Wt 268.0 lb

## 2015-07-30 DIAGNOSIS — G8929 Other chronic pain: Secondary | ICD-10-CM | POA: Diagnosis not present

## 2015-07-30 DIAGNOSIS — I10 Essential (primary) hypertension: Secondary | ICD-10-CM | POA: Diagnosis not present

## 2015-07-30 DIAGNOSIS — Z79891 Long term (current) use of opiate analgesic: Secondary | ICD-10-CM | POA: Diagnosis not present

## 2015-07-30 DIAGNOSIS — Z79899 Other long term (current) drug therapy: Secondary | ICD-10-CM | POA: Diagnosis not present

## 2015-07-30 DIAGNOSIS — Z5181 Encounter for therapeutic drug level monitoring: Secondary | ICD-10-CM

## 2015-07-30 DIAGNOSIS — F339 Major depressive disorder, recurrent, unspecified: Secondary | ICD-10-CM

## 2015-07-30 DIAGNOSIS — M7541 Impingement syndrome of right shoulder: Secondary | ICD-10-CM | POA: Diagnosis not present

## 2015-07-30 DIAGNOSIS — E1351 Other specified diabetes mellitus with diabetic peripheral angiopathy without gangrene: Secondary | ICD-10-CM

## 2015-07-30 DIAGNOSIS — G4701 Insomnia due to medical condition: Secondary | ICD-10-CM | POA: Diagnosis not present

## 2015-07-30 DIAGNOSIS — E039 Hypothyroidism, unspecified: Secondary | ICD-10-CM

## 2015-07-30 DIAGNOSIS — E1151 Type 2 diabetes mellitus with diabetic peripheral angiopathy without gangrene: Secondary | ICD-10-CM | POA: Diagnosis not present

## 2015-07-30 DIAGNOSIS — K219 Gastro-esophageal reflux disease without esophagitis: Secondary | ICD-10-CM | POA: Diagnosis not present

## 2015-07-30 MED ORDER — PANTOPRAZOLE SODIUM 40 MG PO TBEC: 40.0000 mg | DELAYED_RELEASE_TABLET | Freq: Two times a day (BID) | ORAL | Status: DC

## 2015-07-30 MED ORDER — METFORMIN HCL 1000 MG PO TABS: ORAL_TABLET | ORAL | Status: DC

## 2015-07-30 NOTE — Progress Notes (Signed)
BP 126/84 mmHg  Pulse 62  Resp 16  Wt 268 lb (121.564 kg)  SpO2 95%   Subjective:    Patient ID: [name], female    DOB: 1951-03-13, 65 y.o.   MRN: [id number]  HPI: [name] is a 65 y.o. female  Chief Complaint  Patient presents with  . Medication Refill  . Leg Pain    bilateral, has wounds that are weeping has recently saw vascular but not giving answers  . Gastroesophageal Reflux    patienjt states after everything she eats   She is right-handed; she has a partially torn right rotator cuff; Dr. Sabra Heck gave her an injection this morning in the shoulder; going on for ten years  Diabetes; on metformin; checks FSBS infrequently; last A1c was 5.8; steroids run sugar up; this A1c won't be as good this time she says  She has had issues with her leg; surgery on the left leg; Dr. Lucky Cowboy; huge hematoma; he found infection underneath  HTN; has been low; Dr. [name] put her on metoprolol and BP was running low; 104/50, 120/40, just real low; he told her to adjust to just half of a pill twice a day if low, but now used to it and taking whole pill BID  Still has really bad indigestion even with the pantoprazole; she had bariatric srugery in 2014; he repaired a hiatal hernia; then last fall had EGD, barium swallow; tried omeprazole and dexilant and aciphex; no ulcers; no NSAIDs; HOB is elevated; drinks coffee, having one cup instead of two cups  She has Xanax on her medicine list, in addition to hydrocodone; I asked why she is on Xanax; she relates some marital issues; went to counseling; her 57 year old son had a heart attack; she has stress in her life; not undergoing counseling right now   Depression screen [facility] 2/9 07/30/2015 07/12/2015 02/21/2015 01/16/2015 11/28/2014  Decreased Interest 0 0 0 0 0  Down, Depressed, Hopeless 1 0 0 0 0  PHQ - 2 Score 1 0 0 0 0  Altered sleeping - - - - -  Tired, decreased energy - - - - -  Change in appetite - - - - -  Feeling bad or failure  about yourself  - - - - -  Trouble concentrating - - - - -  Moving slowly or fidgety/restless - - - - -  Suicidal thoughts - - - - -  PHQ-9 Score - - - - -  Difficult doing work/chores - - - - -   Relevant past medical, surgical, family and social history reviewed Past Medical History  Diagnosis Date  . Hypothyroidism   . Diabetes ([place])   . Venous stasis dermatitis of both lower extremities   . Kidney disease   . Depression   . Dysthymic disorder   . Paroxysmal atrial fibrillation ([place]) 2007    In June 2015, Cardiac Event Monitor: Mostly SR/sinus arrhythmia with PVCs that are frequent. Short bursts of A. fib lasting several minutes  . Asthma   . GERD (gastroesophageal reflux disease)   . Heart murmur     Echocardiogram June 2015: Mild MR (possible vegetation seen on TTE, not seen on TEE), normal LV size with moderate concentric LVH. Normal function EF 60-65%. Normal diastolic function. Mild LA dilation.  . DVT (deep venous thrombosis) ([place])   . Insomnia   . Atrial fibrillation, controlled ([place])   . Cystocele   . Neuropathy involving both lower extremities ([place])   .  Anxiety and depression   . Obesity, Class II, BMI 35-39.9, with comorbidity ([place])   . Kidney stone   . Adrenal mass ([place])   . Sleep apnea   . Arthritis    Past Surgical History  Procedure Laterality Date  . Hernia repair    . Ankle surgery Right   . Vaginal hysterectomy    . Sleeve gastroplasty    . Lithotripsy    . Kidney surgery    . Transthoracic echocardiogram  08/03/2013    Mild-Moderate concentric LVH, EF 60-65%. Normal diastolic function. Mild LA dilation. Mild MR with possible vegetation  - not confirmed on TEE   . Transesophageal echocardiogram  08/08/2013    Mild LVH, EF 60-65%. Moderate LA dilation and mild RA dilation. Mild MR with no evidence of stenosis and no evidence of endocarditis. A false chordae is noted.  . Tonsillectomy    . Total knee arthroplasty    . Hiatal hernia repair    . Joint  replacement      left knee replacement  . Cholecystectomy    . I&d extremity Left 06/27/2015    Procedure: IRRIGATION AND DEBRIDEMENT EXTREMITY            ( CALF HEMATOMA ) POSSIBLE WOUND VAC;  Surgeon: [name], MD;  Location: [facility] ORS;  Service: Vascular;  Laterality: Left;  . Application of wound vac Left 06/27/2015    Procedure: APPLICATION OF WOUND VAC ( POSSIBLE ) ;  Surgeon: [name], MD;  Location: [facility] ORS;  Service: Vascular;  Laterality: Left;  . Removal of left hematoma     Family History  Problem Relation Age of Onset  . Skin cancer Father   . Diabetes Father   . Hypertension Father   . Peripheral vascular disease Father   . Cancer Father   . Cerebral aneurysm Father   . Alcohol abuse Father   . Varicose Veins Mother   . Kidney disease Mother   . Arthritis Mother   . Mental illness Sister   . Cancer Maternal Aunt     breast  . Breast cancer Maternal Aunt   . Arthritis Maternal Grandmother   . Hypertension Maternal Grandmother   . Diabetes Maternal Grandmother   . Arthritis Maternal Grandfather   . Heart disease Maternal Grandfather   . Hypertension Maternal Grandfather   . Arthritis Paternal Grandmother   . Hypertension Paternal Grandmother   . Diabetes Paternal Grandmother   . Arthritis Paternal Grandfather   . Hypertension Paternal Grandfather    Social History  Substance Use Topics  . Smoking status: Never Smoker   . Smokeless tobacco: Never Used  . Alcohol Use: No   Interim medical history since last visit reviewed. Allergies and medications reviewed  Review of Systems Per HPI unless specifically indicated above     Objective:    BP 126/84 mmHg  Pulse 62  Resp 16  Wt 268 lb (121.564 kg)  SpO2 95%  Wt Readings from Last 3 Encounters:  08/28/15 276 lb 8 oz (125.42 kg)  07/30/15 268 lb (121.564 kg)  07/12/15 260 lb (117.935 kg)   Body mass index is 45.98 kg/(m^2).  Physical Exam  Constitutional: She appears well-developed and  well-nourished. No distress.  Morbidly obese  HENT:  Head: Atraumatic.  Eyes: No scleral icterus.  Neck: No JVD present.  Cardiovascular: Normal rate and regular rhythm.   Pulmonary/Chest: Effort normal and breath sounds normal.  Abdominal: She exhibits no distension.  Neurological: She  is alert.  Skin: No pallor.  Psychiatric: She has a normal mood and affect.   Results for orders placed or performed in visit on 07/12/15  ToxASSURE Select 55 (MW), Urine  Result Value Ref Range   ToxAssure Select 13 FINAL    PDF .       Assessment & Plan:   Problem List Items Addressed This Visit      Cardiovascular and Mediastinum   DM (diabetes mellitus) type II controlled peripheral vascular disorder ([place]) - Primary (Chronic)    Check A1c; foot exam by MD; sees vascular doctor      Relevant Medications   metFORMIN (GLUCOPHAGE) 1000 MG tablet   Other Relevant Orders   Hgb A1c w/o eAG   Lipid Panel w/o Chol/HDL Ratio   Hypertension goal BP (blood pressure) < 150/90    Managed by cardiologist; I would prefer her systolic goal be less than 140, but I will not change the problem as he has it listed      Peripheral vascular disease due to secondary diabetes mellitus (HCC) (Chronic)    Sees vascular doctor       Relevant Medications   metFORMIN (GLUCOPHAGE) 1000 MG tablet     Digestive   Gastro-esophageal reflux disease without esophagitis    Long-term PPI use can have health consequences; avoid triggers      Relevant Medications   pantoprazole (PROTONIX) 40 MG tablet     Endocrine   Hypothyroidism, adult    Check TSH      Relevant Orders   TSH     Other   Insomnia secondary to chronic pain (Chronic)    Warned patient about the concurrent use ambien plus the benzo plus the narcotic; if she is going to stay on the narcotic, I strongly urge her to stop the [place]; warning of Feb 2012 study discussed      Long term current use of opiate analgesic (Chronic)    Warned about  the risk of concurrent use of opiates and benzos; showed her the FDA release from Nov 21, 2014; advised her to come of of benzo and to stop using [place]      RESOLVED: Long term prescription opiate use (Chronic)    Advised of FDA press release      Major depression, recurrent, chronic ([place])    Not a good idea to be on long-term benzodiazepines, as these can have a depressing effect; continue effexor and bupropion; recommended counseling      RESOLVED: Medication monitoring encounter   Relevant Orders   Comprehensive metabolic panel      Follow up plan: Return in about 3 months (around 10/30/2015) for 40 minute visit for multiple issues.  An after-visit summary was printed and given to the patient at [facility].  Please see the patient instructions which may contain other information and recommendations beyond what is mentioned above in the assessment and plan.  Meds ordered this encounter  Medications  . DISCONTD: metFORMIN (GLUCOPHAGE) 1000 MG tablet    Sig: Take 1,000 mg by mouth daily with breakfast.  . pantoprazole (PROTONIX) 40 MG tablet    Sig: Take 1 tablet (40 mg total) by mouth 2 (two) times daily. If needed; Caution:prolonged use may increase risk of pneumonia, colitis, osteoporosis, anemia    Dispense:  60 tablet    Refill:  0    New instructions  . metFORMIN (GLUCOPHAGE) 1000 MG tablet    Sig: One pill by mouth every morning about 15 minutes  before morning meal, and two pills 15 minutes before evening meal    Dispense:  90 tablet    Refill:  3   Orders Placed This Encounter  Procedures  . Comprehensive metabolic panel  . Hgb A1c w/o eAG  . Lipid Panel w/o Chol/HDL Ratio  . TSH

## 2015-07-30 NOTE — Assessment & Plan Note (Signed)
Check A1c; foot exam by MD; sees vascular doctor

## 2015-07-30 NOTE — Assessment & Plan Note (Signed)
Check TSH 

## 2015-07-30 NOTE — Assessment & Plan Note (Signed)
Advised of FDA press release

## 2015-07-30 NOTE — Assessment & Plan Note (Signed)
Sees vascular doctor 

## 2015-07-30 NOTE — Telephone Encounter (Signed)
Pt forgot to ask for a refill

## 2015-07-30 NOTE — Patient Instructions (Addendum)
Try melatonin 3 mg every night at the exact same time of night for 3 weeks to reset your clock Take the pantoprazole twice a day Avoid the trigger foods and drinks Have labs done today If you have not heard anything from my staff in a week about any orders/referrals/studies from today, please contact us here to follow-up [phone] I will recommend that you no longer take zolpidem or alprazolam

## 2015-07-31 DIAGNOSIS — I10 Essential (primary) hypertension: Secondary | ICD-10-CM | POA: Diagnosis not present

## 2015-07-31 DIAGNOSIS — J449 Chronic obstructive pulmonary disease, unspecified: Secondary | ICD-10-CM | POA: Diagnosis not present

## 2015-07-31 DIAGNOSIS — I890 Lymphedema, not elsewhere classified: Secondary | ICD-10-CM | POA: Diagnosis not present

## 2015-07-31 DIAGNOSIS — Z7984 Long term (current) use of oral hypoglycemic drugs: Secondary | ICD-10-CM | POA: Diagnosis not present

## 2015-07-31 DIAGNOSIS — F329 Major depressive disorder, single episode, unspecified: Secondary | ICD-10-CM | POA: Diagnosis not present

## 2015-07-31 DIAGNOSIS — S81802D Unspecified open wound, left lower leg, subsequent encounter: Secondary | ICD-10-CM | POA: Diagnosis not present

## 2015-07-31 DIAGNOSIS — E119 Type 2 diabetes mellitus without complications: Secondary | ICD-10-CM | POA: Diagnosis not present

## 2015-07-31 DIAGNOSIS — S81801D Unspecified open wound, right lower leg, subsequent encounter: Secondary | ICD-10-CM | POA: Diagnosis not present

## 2015-07-31 DIAGNOSIS — Z7901 Long term (current) use of anticoagulants: Secondary | ICD-10-CM | POA: Diagnosis not present

## 2015-07-31 NOTE — Telephone Encounter (Signed)
This needs to come from Dr. Enzo Bi; he prescribed this and last refill said patient needed to schedule her annual

## 2015-08-05 ENCOUNTER — Other Ambulatory Visit: Payer: Self-pay | Admitting: Obstetrics and Gynecology

## 2015-08-05 ENCOUNTER — Telehealth: Payer: Self-pay | Admitting: Family Medicine

## 2015-08-05 ENCOUNTER — Other Ambulatory Visit: Payer: Self-pay | Admitting: Family Medicine

## 2015-08-05 ENCOUNTER — Other Ambulatory Visit
Admission: RE | Admit: 2015-08-05 | Discharge: 2015-08-05 | Disposition: A | Discharge status: Home / Self Care | Payer: PPO | Source: Ambulatory Visit | Attending: Family Medicine | Admitting: Family Medicine

## 2015-08-05 DIAGNOSIS — Z5181 Encounter for therapeutic drug level monitoring: Secondary | ICD-10-CM | POA: Insufficient documentation

## 2015-08-05 DIAGNOSIS — L0390 Cellulitis, unspecified: Secondary | ICD-10-CM | POA: Insufficient documentation

## 2015-08-05 DIAGNOSIS — E1151 Type 2 diabetes mellitus with diabetic peripheral angiopathy without gangrene: Secondary | ICD-10-CM | POA: Diagnosis not present

## 2015-08-05 DIAGNOSIS — L03119 Cellulitis of unspecified part of limb: Secondary | ICD-10-CM

## 2015-08-05 LAB — COMPREHENSIVE METABOLIC PANEL
ALK PHOS: 79 U/L (ref 38–126)
ALT: 14 U/L (ref 14–54)
ANION GAP: 6 (ref 5–15)
AST: 14 U/L — ABNORMAL LOW (ref 15–41)
Albumin: 3.8 g/dL (ref 3.5–5.0)
BILIRUBIN TOTAL: 0.4 mg/dL (ref 0.3–1.2)
BUN: 23 mg/dL — ABNORMAL HIGH (ref 6–20)
CALCIUM: 8.7 mg/dL — AB (ref 8.9–10.3)
CO2: 26 mmol/L (ref 22–32)
Chloride: 107 mmol/L (ref 101–111)
Creatinine, Ser: 0.8 mg/dL (ref 0.44–1.00)
Glucose, Bld: 146 mg/dL — ABNORMAL HIGH (ref 65–99)
POTASSIUM: 4.8 mmol/L (ref 3.5–5.1)
Sodium: 139 mmol/L (ref 135–145)
Total Protein: 7 g/dL (ref 6.5–8.1)

## 2015-08-05 LAB — TSH: TSH: 1.565 u[IU]/mL (ref 0.350–4.500)

## 2015-08-05 LAB — LIPID PANEL
Cholesterol: 173 mg/dL (ref 0–200)
HDL: 97 mg/dL (ref >40)
LDL CALC: 66 mg/dL (ref 0–99)
TRIGLYCERIDES: 50 mg/dL (ref <150)
Total CHOL/HDL Ratio: 1.8 RATIO
VLDL: 10 mg/dL (ref 0–40)

## 2015-08-05 NOTE — Telephone Encounter (Signed)
Add-on faxed to lab [facility]

## 2015-08-05 NOTE — Telephone Encounter (Signed)
Order entered; please fax or have them take out of queue, however that works; thank you

## 2015-08-05 NOTE — Telephone Encounter (Signed)
Patient has reason to believe that she has cellulitis. A order was written for lab work for fasting. Asking that you could call an order in for white blood count to see what is going on with the leg. Patient is there now

## 2015-08-06 DIAGNOSIS — M25511 Pain in right shoulder: Secondary | ICD-10-CM | POA: Diagnosis not present

## 2015-08-06 DIAGNOSIS — M549 Dorsalgia, unspecified: Secondary | ICD-10-CM | POA: Diagnosis not present

## 2015-08-06 DIAGNOSIS — M79609 Pain in unspecified limb: Secondary | ICD-10-CM | POA: Diagnosis not present

## 2015-08-06 DIAGNOSIS — I890 Lymphedema, not elsewhere classified: Secondary | ICD-10-CM | POA: Diagnosis not present

## 2015-08-06 DIAGNOSIS — I10 Essential (primary) hypertension: Secondary | ICD-10-CM | POA: Diagnosis not present

## 2015-08-06 DIAGNOSIS — M7981 Nontraumatic hematoma of soft tissue: Secondary | ICD-10-CM | POA: Diagnosis not present

## 2015-08-06 DIAGNOSIS — L97211 Non-pressure chronic ulcer of right calf limited to breakdown of skin: Secondary | ICD-10-CM | POA: Diagnosis not present

## 2015-08-06 DIAGNOSIS — I87091 Postthrombotic syndrome with other complications of right lower extremity: Secondary | ICD-10-CM | POA: Diagnosis not present

## 2015-08-06 DIAGNOSIS — M7989 Other specified soft tissue disorders: Secondary | ICD-10-CM | POA: Diagnosis not present

## 2015-08-06 DIAGNOSIS — M5412 Radiculopathy, cervical region: Secondary | ICD-10-CM | POA: Insufficient documentation

## 2015-08-06 DIAGNOSIS — E119 Type 2 diabetes mellitus without complications: Secondary | ICD-10-CM | POA: Diagnosis not present

## 2015-08-06 LAB — HEMOGLOBIN A1C: Hgb A1c MFr Bld: 6.6 % — ABNORMAL HIGH (ref 4.0–6.0)

## 2015-08-12 ENCOUNTER — Telehealth: Payer: Self-pay | Admitting: Family Medicine

## 2015-08-12 DIAGNOSIS — S81801D Unspecified open wound, right lower leg, subsequent encounter: Secondary | ICD-10-CM | POA: Diagnosis not present

## 2015-08-12 DIAGNOSIS — Z7984 Long term (current) use of oral hypoglycemic drugs: Secondary | ICD-10-CM | POA: Diagnosis not present

## 2015-08-12 DIAGNOSIS — F329 Major depressive disorder, single episode, unspecified: Secondary | ICD-10-CM | POA: Diagnosis not present

## 2015-08-12 DIAGNOSIS — S81802D Unspecified open wound, left lower leg, subsequent encounter: Secondary | ICD-10-CM | POA: Diagnosis not present

## 2015-08-12 DIAGNOSIS — Z7901 Long term (current) use of anticoagulants: Secondary | ICD-10-CM | POA: Diagnosis not present

## 2015-08-12 DIAGNOSIS — J449 Chronic obstructive pulmonary disease, unspecified: Secondary | ICD-10-CM | POA: Diagnosis not present

## 2015-08-12 DIAGNOSIS — E119 Type 2 diabetes mellitus without complications: Secondary | ICD-10-CM | POA: Diagnosis not present

## 2015-08-12 DIAGNOSIS — I890 Lymphedema, not elsewhere classified: Secondary | ICD-10-CM | POA: Diagnosis not present

## 2015-08-12 DIAGNOSIS — I10 Essential (primary) hypertension: Secondary | ICD-10-CM | POA: Diagnosis not present

## 2015-08-12 NOTE — Telephone Encounter (Signed)
PT ASKING FOR HER LAB RESULTS

## 2015-08-16 ENCOUNTER — Telehealth: Payer: Self-pay | Admitting: Family Medicine

## 2015-08-16 NOTE — Telephone Encounter (Signed)
Pt nlotified ?

## 2015-08-16 NOTE — Telephone Encounter (Signed)
Patient states she has not seen Dr. Enzo Bi at [facility] in years due to having a hysterectomy. PCP has been filling vaginal cream for patient.

## 2015-08-16 NOTE — Telephone Encounter (Signed)
ESTRACE PT IS NEEDING TO BE REFILLED. PHARM IS [facility]. SAID THAT SHE IS NEEDING IT BAD. SHE IS TOTALLY OUT AND WHEN WIPING SHE IS NOW SEEING BLOOD FROM THE VAGINAL AREA.CAN LEAVE A MESSAGE IF NO ANSWER.

## 2015-08-16 NOTE — Telephone Encounter (Signed)
I don't mean to argue with her; Dr. Enzo Bi filled this for her in December of 2016 and his note says patient should be seen; please please please ask her to contact Dr. Thamas Jaegers office

## 2015-08-16 NOTE — Telephone Encounter (Signed)
Left voice mail

## 2015-08-16 NOTE — Telephone Encounter (Signed)
This request needs to be managed by Dr. Enzo Bi; I'm not sure how we got in the middle of this; she is supposed to be getting this from his office; please encourage her to not be mad at [place] for not refilling it; this is not our medicine to manage; she needs to work this out with Dr. Enzo Bi please; if she needs an appointment with him, I trust him and I am sure it is for good reason; please have her call his office and arrange to be seen

## 2015-08-20 ENCOUNTER — Telehealth: Payer: Self-pay | Admitting: Obstetrics and Gynecology

## 2015-08-20 NOTE — Telephone Encounter (Signed)
Patient called requesting estrace samples. Thanks

## 2015-08-20 NOTE — Telephone Encounter (Signed)
Pt aware sample up front for pick up. Has an appt 6/14.

## 2015-08-23 DIAGNOSIS — S81801D Unspecified open wound, right lower leg, subsequent encounter: Secondary | ICD-10-CM | POA: Diagnosis not present

## 2015-08-23 DIAGNOSIS — Z7984 Long term (current) use of oral hypoglycemic drugs: Secondary | ICD-10-CM | POA: Diagnosis not present

## 2015-08-23 DIAGNOSIS — I890 Lymphedema, not elsewhere classified: Secondary | ICD-10-CM | POA: Diagnosis not present

## 2015-08-23 DIAGNOSIS — F329 Major depressive disorder, single episode, unspecified: Secondary | ICD-10-CM | POA: Diagnosis not present

## 2015-08-23 DIAGNOSIS — J449 Chronic obstructive pulmonary disease, unspecified: Secondary | ICD-10-CM | POA: Diagnosis not present

## 2015-08-23 DIAGNOSIS — S81802D Unspecified open wound, left lower leg, subsequent encounter: Secondary | ICD-10-CM | POA: Diagnosis not present

## 2015-08-23 DIAGNOSIS — I10 Essential (primary) hypertension: Secondary | ICD-10-CM | POA: Diagnosis not present

## 2015-08-23 DIAGNOSIS — Z7901 Long term (current) use of anticoagulants: Secondary | ICD-10-CM | POA: Diagnosis not present

## 2015-08-23 DIAGNOSIS — E119 Type 2 diabetes mellitus without complications: Secondary | ICD-10-CM | POA: Diagnosis not present

## 2015-08-26 ENCOUNTER — Other Ambulatory Visit: Payer: Self-pay

## 2015-08-26 DIAGNOSIS — S81801D Unspecified open wound, right lower leg, subsequent encounter: Secondary | ICD-10-CM | POA: Diagnosis not present

## 2015-08-26 DIAGNOSIS — S81802D Unspecified open wound, left lower leg, subsequent encounter: Secondary | ICD-10-CM | POA: Diagnosis not present

## 2015-08-26 DIAGNOSIS — I10 Essential (primary) hypertension: Secondary | ICD-10-CM | POA: Diagnosis not present

## 2015-08-26 DIAGNOSIS — Z7901 Long term (current) use of anticoagulants: Secondary | ICD-10-CM | POA: Diagnosis not present

## 2015-08-26 DIAGNOSIS — I890 Lymphedema, not elsewhere classified: Secondary | ICD-10-CM | POA: Diagnosis not present

## 2015-08-26 DIAGNOSIS — F329 Major depressive disorder, single episode, unspecified: Secondary | ICD-10-CM | POA: Diagnosis not present

## 2015-08-26 DIAGNOSIS — J449 Chronic obstructive pulmonary disease, unspecified: Secondary | ICD-10-CM | POA: Diagnosis not present

## 2015-08-26 DIAGNOSIS — Z7984 Long term (current) use of oral hypoglycemic drugs: Secondary | ICD-10-CM | POA: Diagnosis not present

## 2015-08-26 DIAGNOSIS — E119 Type 2 diabetes mellitus without complications: Secondary | ICD-10-CM | POA: Diagnosis not present

## 2015-08-26 MED ORDER — QUINAPRIL HCL 20 MG PO TABS: 20.0000 mg | ORAL_TABLET | Freq: Every day | ORAL | Status: DC

## 2015-08-26 NOTE — Telephone Encounter (Signed)
Cr and K+ from Aug 05, 2015 reviewed; Rx approved

## 2015-08-28 ENCOUNTER — Encounter: Payer: Self-pay | Admitting: Family Medicine

## 2015-08-28 ENCOUNTER — Ambulatory Visit (INDEPENDENT_AMBULATORY_CARE_PROVIDER_SITE_OTHER): Payer: PPO | Admitting: Family Medicine

## 2015-08-28 VITALS — BP 152/70 | HR 59 | Temp 98.0°F | Ht 65.0 in | Wt 276.5 lb

## 2015-08-28 DIAGNOSIS — M12811 Other specific arthropathies, not elsewhere classified, right shoulder: Secondary | ICD-10-CM | POA: Diagnosis not present

## 2015-08-28 DIAGNOSIS — M24111 Other articular cartilage disorders, right shoulder: Secondary | ICD-10-CM | POA: Diagnosis not present

## 2015-08-28 DIAGNOSIS — Z0001 Encounter for general adult medical examination with abnormal findings: Secondary | ICD-10-CM

## 2015-08-28 DIAGNOSIS — M7581 Other shoulder lesions, right shoulder: Secondary | ICD-10-CM | POA: Insufficient documentation

## 2015-08-28 DIAGNOSIS — M19011 Primary osteoarthritis, right shoulder: Secondary | ICD-10-CM | POA: Diagnosis not present

## 2015-08-28 DIAGNOSIS — Z0000 Encounter for general adult medical examination without abnormal findings: Secondary | ICD-10-CM

## 2015-08-28 DIAGNOSIS — M75111 Incomplete rotator cuff tear or rupture of right shoulder, not specified as traumatic: Secondary | ICD-10-CM | POA: Diagnosis not present

## 2015-08-28 NOTE — Progress Notes (Signed)
Pre visit review using our clinic review tool, if applicable. No additional management support is needed unless otherwise documented below in the visit note. 

## 2015-08-28 NOTE — Patient Instructions (Addendum)
It was nice to see you today.  Continue your current medications.  Be sure to follow up with Dr. Lucky Cowboy.   Call regarding refills or other concerns. Ask for Ashleigh  Follow up:  Return in about 6 months (around 02/27/2016), or if symptoms worsen or fail to improve.  Take care  Dr. Lacinda Axon

## 2015-08-29 ENCOUNTER — Telehealth: Payer: Self-pay | Admitting: *Deleted

## 2015-08-29 ENCOUNTER — Other Ambulatory Visit: Payer: Self-pay

## 2015-08-29 DIAGNOSIS — Z7984 Long term (current) use of oral hypoglycemic drugs: Secondary | ICD-10-CM | POA: Diagnosis not present

## 2015-08-29 DIAGNOSIS — Z7901 Long term (current) use of anticoagulants: Secondary | ICD-10-CM | POA: Diagnosis not present

## 2015-08-29 DIAGNOSIS — I10 Essential (primary) hypertension: Secondary | ICD-10-CM | POA: Diagnosis not present

## 2015-08-29 DIAGNOSIS — F329 Major depressive disorder, single episode, unspecified: Secondary | ICD-10-CM | POA: Diagnosis not present

## 2015-08-29 DIAGNOSIS — E119 Type 2 diabetes mellitus without complications: Secondary | ICD-10-CM | POA: Diagnosis not present

## 2015-08-29 DIAGNOSIS — F419 Anxiety disorder, unspecified: Secondary | ICD-10-CM

## 2015-08-29 DIAGNOSIS — L97812 Non-pressure chronic ulcer of other part of right lower leg with fat layer exposed: Secondary | ICD-10-CM | POA: Diagnosis not present

## 2015-08-29 DIAGNOSIS — J449 Chronic obstructive pulmonary disease, unspecified: Secondary | ICD-10-CM | POA: Diagnosis not present

## 2015-08-29 DIAGNOSIS — I87091 Postthrombotic syndrome with other complications of right lower extremity: Secondary | ICD-10-CM | POA: Diagnosis not present

## 2015-08-29 NOTE — Assessment & Plan Note (Signed)
Will need additional pneumococcal vaccine later this year. Advised to get eye exam. Remainder of preventative healthcare up-to-date except for hepatitis C screening.

## 2015-08-29 NOTE — Telephone Encounter (Signed)
This states that PCP is [employer]

## 2015-08-29 NOTE — Telephone Encounter (Signed)
Medical clearance needs to come from cardiology.

## 2015-08-29 NOTE — Progress Notes (Addendum)
Subjective:  Patient ID: [name], female    DOB: 04/23/1950  Age: 65 y.o. MRN: [id number]  CC: Establish care  HPI BOBBIE ROBECK is a 65 y.o. female presents to the clinic today to establish care. Patient has a complicated medical history and is followed by several specialists.  Preventative Healthcare  Pap smear: Up to date.  Mammogram: Up to date.  Colonoscopy: Up to date.   Immunizations  Tetanus - Up to date.  Pneumococcal - Will need additional one later this year.  Flu - Up to date.  Zoster - Up to date.  Labs: Has had recent labs.  Exercise: Not currently.  Alcohol use: See below.   Smoking/tobacco use: See below.   STD/HIV testing: Up to date.   Regular dental exams: Yes.   Wears seat belt: Yes.   Needs eye exam.  PMH, Surgical Hx, Family Hx, Social History reviewed and updated as below.  Past Medical History  Diagnosis Date  . Hypothyroidism   . Diabetes ([place])   . Venous stasis dermatitis of both lower extremities   . Kidney disease   . Depression   . Dysthymic disorder   . Paroxysmal atrial fibrillation ([place]) 2007    In June 2015, Cardiac Event Monitor: Mostly SR/sinus arrhythmia with PVCs that are frequent. Short bursts of A. fib lasting several minutes  . Asthma   . GERD (gastroesophageal reflux disease)   . Heart murmur     Echocardiogram June 2015: Mild MR (possible vegetation seen on TTE, not seen on TEE), normal LV size with moderate concentric LVH. Normal function EF 60-65%. Normal diastolic function. Mild LA dilation.  . DVT (deep venous thrombosis) ([place])   . Insomnia   . Atrial fibrillation, controlled ([place])   . Cystocele   . Neuropathy involving both lower extremities ([place])   . Anxiety and depression   . Obesity, Class II, BMI 35-39.9, with comorbidity ([place])   . Kidney stone   . Adrenal mass ([place])   . Sleep apnea   . Arthritis    Past Surgical History  Procedure Laterality Date  . Hernia repair    . Ankle surgery  Right   . Vaginal hysterectomy    . Sleeve gastroplasty    . Lithotripsy    . Kidney surgery    . Transthoracic echocardiogram  08/03/2013    Mild-Moderate concentric LVH, EF 60-65%. Normal diastolic function. Mild LA dilation. Mild MR with possible vegetation  - not confirmed on TEE   . Transesophageal echocardiogram  08/08/2013    Mild LVH, EF 60-65%. Moderate LA dilation and mild RA dilation. Mild MR with no evidence of stenosis and no evidence of endocarditis. A false chordae is noted.  . Tonsillectomy    . Total knee arthroplasty    . Hiatal hernia repair    . Joint replacement      left knee replacement  . Cholecystectomy    . I&d extremity Left 06/27/2015    Procedure: IRRIGATION AND DEBRIDEMENT EXTREMITY            ( CALF HEMATOMA ) POSSIBLE WOUND VAC;  Surgeon: [name], MD;  Location: [facility] ORS;  Service: Vascular;  Laterality: Left;  . Application of wound vac Left 06/27/2015    Procedure: APPLICATION OF WOUND VAC ( POSSIBLE ) ;  Surgeon: [name], MD;  Location: [facility] ORS;  Service: Vascular;  Laterality: Left;  . Removal of left hematoma     Family History  Problem Relation Age of Onset  . Skin cancer Father   . Diabetes Father   . Hypertension Father   . Peripheral vascular disease Father   . Cancer Father   . Cerebral aneurysm Father   . Alcohol abuse Father   . Varicose Veins Mother   . Kidney disease Mother   . Arthritis Mother   . Mental illness Sister   . Cancer Maternal Aunt     breast  . Breast cancer Maternal Aunt   . Arthritis Maternal Grandmother   . Hypertension Maternal Grandmother   . Diabetes Maternal Grandmother   . Arthritis Maternal Grandfather   . Heart disease Maternal Grandfather   . Hypertension Maternal Grandfather   . Arthritis Paternal Grandmother   . Hypertension Paternal Grandmother   . Diabetes Paternal Grandmother   . Arthritis Paternal Grandfather   . Hypertension Paternal Grandfather    Social History  Substance Use Topics    . Smoking status: Never Smoker   . Smokeless tobacco: Never Used  . Alcohol Use: No   Review of Systems  Gastrointestinal: Positive for diarrhea.  Endocrine: Positive for polydipsia.  Genitourinary: Positive for frequency.       Urinary incontinence.  Skin:       Lower extremity wounds.  Psychiatric/Behavioral:       Anxiety.  All other systems reviewed and are negative.  Objective:   Today's Vitals: BP 152/70 mmHg  Pulse 59  Temp(Src) 98 F (36.7 C) (Oral)  Ht 5\' 5"  (1.651 m)  Wt 276 lb 8 oz (125.42 kg)  BMI 46.01 kg/m2  SpO2 93%  Physical Exam  Constitutional: She is oriented to person, place, and time.  Morbidly obese female no acute distress.  HENT:  Head: Normocephalic and atraumatic.  Mouth/Throat: Oropharynx is clear and moist.  Eyes: Conjunctivae are normal. No scleral icterus.  Neck: Neck supple.  Cardiovascular: Normal rate and regular rhythm.   DP pulses palpable bilaterally. Cannot palpate PT pulses.   Pulmonary/Chest: Effort normal. She has no wheezes. She has no rales.  Abdominal: Soft. She exhibits no distension. There is no tenderness. There is no rebound and no guarding.  Neurological: She is alert and oriented to person, place, and time.  Skin:  Left lower extremity with evidence of chronic venous insufficiency. Right lower extremity with Unna boot in place. Patient states that she's had a recent blister.   Psychiatric: She has a normal mood and affect.  Vitals reviewed.  Assessment & Plan:   Problem List Items Addressed This Visit    Encounter for preventative adult health care exam with abnormal findings - Primary    Will need additional pneumococcal vaccine later this year. Advised to get eye exam. Remainder of preventative healthcare up-to-date except for hepatitis C screening.         Outpatient Encounter Prescriptions as of 08/28/2015  Medication Sig  . albuterol (PROVENTIL HFA;VENTOLIN HFA) 108 (90 Base) MCG/ACT inhaler Inhale 1-2  puffs into the lungs every 4 (four) hours as needed for wheezing or shortness of breath.  Marland Kitchen albuterol (PROVENTIL) (2.5 MG/3ML) 0.083% nebulizer solution Take 3 mLs (2.5 mg total) by nebulization every 6 (six) hours as needed for wheezing or shortness of breath.  . ALPRAZolam (XANAX) 0.5 MG tablet TAKE ONE TABLET TWICE DAILY AS NEEDED  . buPROPion (WELLBUTRIN XL) 300 MG 24 hr tablet TAKE ONE TABLET BY MOUTH EVERY DAY  . DULoxetine (CYMBALTA) 60 MG capsule Take 1 capsule (60 mg total) by mouth daily.  Marland Kitchen  ESTRACE VAGINAL 0.1 MG/GM vaginal cream INSERT 1 GRAM VAGINALLY 2 TIMES A WEEK  . flecainide (TAMBOCOR) 100 MG tablet Take 2 tablets (200 mg total) by mouth as needed (for afib. May take additional 100mg  flecainide six hours later if symptoms persist.).  Marland Kitchen gabapentin (NEURONTIN) 800 MG tablet Take 1 tablet (800 mg total) by mouth every 6 (six) hours.  Marland Kitchen HYDROcodone-acetaminophen (NORCO) 5-325 MG tablet Take 1 tablet by mouth every 8 (eight) hours as needed for severe pain.  Marland Kitchen HYDROcodone-acetaminophen (NORCO/VICODIN) 5-325 MG tablet Take 1 tablet by mouth every 8 (eight) hours as needed for severe pain.  Marland Kitchen HYDROcodone-acetaminophen (NORCO/VICODIN) 5-325 MG tablet Take 1 tablet by mouth every 8 (eight) hours as needed for severe pain.  Marland Kitchen levothyroxine (SYNTHROID, LEVOTHROID) 150 MCG tablet Take 1 tablet (150 mcg total) by mouth every morning.  . Melatonin 10 MG CAPS Take 20 mg by mouth at bedtime as needed.  . metFORMIN (GLUCOPHAGE) 1000 MG tablet One pill by mouth every morning about 15 minutes before morning meal, and two pills 15 minutes before evening meal  . metoprolol tartrate (LOPRESSOR) 25 MG tablet Take 1 tablet (25 mg total) by mouth 2 (two) times daily.  . mirabegron ER (MYRBETRIQ) 25 MG TB24 tablet Take 1 tablet (25 mg total) by mouth daily.  . montelukast (SINGULAIR) 10 MG tablet Take 1 tablet (10 mg total) by mouth daily.  . pantoprazole (PROTONIX) 40 MG tablet Take 1 tablet (40 mg total)  by mouth 2 (two) times daily. If needed; Caution:prolonged use may increase risk of pneumonia, colitis, osteoporosis, anemia  . quinapril (ACCUPRIL) 20 MG tablet Take 1 tablet (20 mg total) by mouth daily.   No facility-administered encounter medications on file as of 08/28/2015.    Follow-up: Return in about 6 months (around 02/27/2016), or if symptoms worsen or fail to improve.  [place]

## 2015-08-29 NOTE — Telephone Encounter (Signed)
Patient has established care at another practice Rx for Xanax denied

## 2015-08-29 NOTE — Telephone Encounter (Signed)
Are we able to give.

## 2015-08-29 NOTE — Telephone Encounter (Signed)
Patient requested a medical clarence to be faxed over to Dr. Roland Rack at [facility] [place]

## 2015-08-29 NOTE — Telephone Encounter (Signed)
Attempted to call patient but line was busy.

## 2015-08-30 NOTE — Telephone Encounter (Signed)
Patient is aware 

## 2015-08-30 NOTE — Telephone Encounter (Signed)
Attempted to call patient but unable to reach patient and no machine to leave a message.

## 2015-09-02 NOTE — Assessment & Plan Note (Signed)
Not a good idea to be on long-term benzodiazepines, as these can have a depressing effect; continue effexor and bupropion; recommended counseling

## 2015-09-02 NOTE — Assessment & Plan Note (Signed)
Long-term PPI use can have health consequences; avoid triggers

## 2015-09-02 NOTE — Assessment & Plan Note (Signed)
Warned about the risk of concurrent use of opiates and benzos; showed her the FDA release from Nov 21, 2014; advised her to come of of benzo and to stop using [place]

## 2015-09-02 NOTE — Assessment & Plan Note (Signed)
Warned patient about the concurrent use ambien plus the benzo plus the narcotic; if she is going to stay on the narcotic, I strongly urge her to stop the [place]; warning of Feb 2012 study discussed

## 2015-09-02 NOTE — Assessment & Plan Note (Signed)
Managed by cardiologist; I would prefer her systolic goal be less than 140, but I will not change the problem as he has it listed

## 2015-09-03 DIAGNOSIS — I890 Lymphedema, not elsewhere classified: Secondary | ICD-10-CM | POA: Diagnosis not present

## 2015-09-04 ENCOUNTER — Ambulatory Visit: Payer: Self-pay | Admitting: Obstetrics and Gynecology

## 2015-09-11 ENCOUNTER — Encounter: Payer: Self-pay | Admitting: Cardiology

## 2015-09-11 ENCOUNTER — Ambulatory Visit (INDEPENDENT_AMBULATORY_CARE_PROVIDER_SITE_OTHER): Payer: PPO | Admitting: Cardiology

## 2015-09-11 VITALS — BP 168/72 | HR 58 | Ht 65.0 in | Wt 278.2 lb

## 2015-09-11 DIAGNOSIS — Z01810 Encounter for preprocedural cardiovascular examination: Secondary | ICD-10-CM | POA: Diagnosis not present

## 2015-09-11 DIAGNOSIS — Z7901 Long term (current) use of anticoagulants: Secondary | ICD-10-CM | POA: Diagnosis not present

## 2015-09-11 DIAGNOSIS — I480 Paroxysmal atrial fibrillation: Secondary | ICD-10-CM

## 2015-09-11 DIAGNOSIS — I10 Essential (primary) hypertension: Secondary | ICD-10-CM | POA: Diagnosis not present

## 2015-09-11 DIAGNOSIS — E1351 Other specified diabetes mellitus with diabetic peripheral angiopathy without gangrene: Secondary | ICD-10-CM

## 2015-09-11 DIAGNOSIS — M25511 Pain in right shoulder: Secondary | ICD-10-CM

## 2015-09-11 DIAGNOSIS — G8929 Other chronic pain: Secondary | ICD-10-CM

## 2015-09-11 NOTE — Assessment & Plan Note (Signed)
It does not sound like she's had any recurrent episodes of A. fib. She remains on stable dose of beta blocker. Still has pill in the pocket flecainide for when necessary dosing. Low risk/negative Myoview suggesting nonischemic etiology. Normal EF.   This patients CHA2DS2-VASc Score and unadjusted Ischemic Stroke Rate (% per year) is equal to 4.8 % stroke rate/year from a score of 4 - on Pradaxa for anticoagulation. No active bleeding. Would be okay to hold if she has any significant bleeding. Would be okay to hold for surgery 48 hours. Would need to be restarted when safe post op.

## 2015-09-11 NOTE — Patient Instructions (Addendum)
Medication Instructions:  Your physician recommends that you continue on your current medications as directed. Please refer to the Current Medication list given to you today.   Labwork: none  Testing/Procedures: none  Follow-Up: Your physician wants you to follow-up in: six months. You will receive a reminder letter in the mail two months in advance. If you don't receive a letter, please call our office to schedule the follow-up appointment.   Any Other Special Instructions Will Be Listed Below (If Applicable). You are at low risk from a cardiac standpoint, for your upcoming procedure. No further testing has been ordered.      If you need a refill on your cardiac medications before your next appointment, please call your pharmacy.

## 2015-09-11 NOTE — Assessment & Plan Note (Signed)
No active bleeds on Pradaxa. Will need to be held 48 hours preop.

## 2015-09-11 NOTE — Assessment & Plan Note (Signed)
ABIs per vascular medicine would suggest good in-line flow. She has good pulses. I suspect that she probably has more of venous stasis then PAD. Continue to follow up with vascular surgeon.

## 2015-09-11 NOTE — Assessment & Plan Note (Signed)
It would appear that she has significant degenerative changes in the shoulder with potential plans for reconstructive surgery. Here for preop assessment. -- This would be a low risk surgery from cardiac standpoint.

## 2015-09-11 NOTE — Assessment & Plan Note (Addendum)
I do agree that it would be nicer to have her blood pressure little bit lower. Currently she is having some pain issues and therefore I'm reluctant to adjust her medications for her preoperative visit. She really isn't on a blood pressure medication besides the metoprolol which is a low dose, and quinapril 20 mg. May need to consider HCTZ to chlorthalidone during follow-up visits.  We chose a higher systolic pressure to allow for permissive hypertension and avoid dizziness/near syncope. Truly a goal would be 140/80.

## 2015-09-11 NOTE — Assessment & Plan Note (Addendum)
As noted in my last clinic visit. The patient is a low risk patient for low risk surgery.  Recent Myoview was negative. This would significantly reduced risk overall. From a cardiovascular risk standpoint.  PREOPERATIVE CARDIAC RISK ASSESSMENT   Revised Cardiac Risk Index:  High Risk Surgery: no; R shoulder surgery  Defined as Intraperitoneal, intrathoracic or suprainguinal vascular  Active CAD: no; Recent Myoview negative & no prior history  CHF: no; edema is more c/w venous stasis  Cerebrovascular Disease: no;   Diabetes: yes; On Insulin: no  CKD (Cr >~ 2): no; <1  Total: 0  Estimated Risk of Adverse Outcome: VERY LOW  Estimated Risk of MI, PE, VF/VT (Cardiac Arrest), Complete Heart Block: <1 %; she does have a history of PAF which would put her at risk for recurrent A. Fib, if this occurs, I would use amiodarone to treat perioperatively. She is on Pradaxa was going to be held 48 hours preprocedure.  As for any other preoperative evaluation, she just had a stress test so no further evaluation is required.

## 2015-09-11 NOTE — Progress Notes (Signed)
PCP: [name], DO  Clinic Note: Chief Complaint  Patient presents with  . Pre-op Exam    Shoulder surgery  . Atrial Fibrillation    On Pradaxa    HPI: [name] is a 65 y.o. female with a PMH below who presents today for two-month follow-up for atrial fibrillation. She is on Pradaxa for anticoagulation of A. Fib.[name] was last seen on 06/29/2015. Has maintained Well. Fenofibrate she was quite upset about the fact that her son had just been admitted, pain over the weekend she had a sensation of recurrence of A. fib. Therefore went forward with a stress test to exclude ischemic etiology. Stress test in May 2017 showed breast attenuation but low risk study with normal EF. I last saw her, she was doing much better since starting a beta blocker. I guess I actually saw her preoperatively in April. She notes that she is usually aware when she is in nature fibrillation that she notes a "little different uneasy sensation in her chest ". She denies it being a chest tightness or pressure, just in the uneasiness feeling.  Recent Hospitalizations:  She had a brief operation on 06/27/2015 for hematoma of the left calf. She has chronic lower extremity edema and wears compression stockings. Apparently she had recurrent ulcerations cellulitis initially the right shin and calf. She dropped a chair on her leg and suffered a large hematoma on the left leg.   Studies Reviewed: No new studies  Interval History: [name] presents today earlier than expected for her routine follow-up, as a preoperative evaluation for shoulder surgery. She apparently has seen a new orthopedic surgeon who recommends surgery on her right shoulder for persistent pain. Apparently she has some osteophytes and rotator cuff damage. She has recovered relatively well from her left leg hematoma, but is now having issues with her right lower leg with a small thumb sized ulcer. She currently has a Unaboot wrap in  place. As result of these in June related issues, she has not been as active as usual, and is very upset about the fact that she has gained a significant amount of weight as noted below. She hates not being up to do her routine chores around the house, and is hoping to take advantage the fact that she needs to be off her feet because of her leg issues to go ahead and get her shoulder surgery done in order to double up her convalescence. This way she can get back to doing things sooner. Besides getting a little bit of short of breath if she pushes it, she really is doing fine from a cardiac standpoint. He has not noted any irregular feeling heartbeats or palpitations. Nothing to suggest any recurrence of A. fib. She is not had even think about using flecainide. She denies any chest tightness or pressure with rest or exertion. No resting exertional dyspnea. No PND, orthopnea or edema. No lightheadedness, dizziness or 6B/near syncope, TIAs amaurosis fugax.   No melena, hematochezia, hematuria, or epstaxis. No claudication - she has been followed by the vascular surgeons, who felt that she does not have significant PAD based on vascular ultrasound.  ROS: A comprehensive was performed. Review of Systems  Constitutional: Negative for weight loss (Weight gain).       Overall just frustrated about not being up to get out and about.  HENT: Negative for congestion and nosebleeds.   Eyes: Negative for blurred vision.  Respiratory: Negative for cough, sputum  production, shortness of breath and wheezing.   Cardiovascular: Negative for orthopnea and leg swelling (She has venous stasis changes, but no significant edema.).       Per history of present illness  Gastrointestinal: Negative for constipation, blood in stool and melena.  Genitourinary: Negative for dysuria, frequency and hematuria.  Musculoskeletal: Positive for joint pain (Her hips bother her, as do her legs. Discussed Unaboot on right leg above.).    Skin: Positive for rash (Venous stasis dermatitis bilaterally. Stable). Negative for itching.  Neurological: Negative for dizziness, focal weakness, loss of consciousness and headaches.  Endo/Heme/Allergies: Bruises/bleeds easily.  Psychiatric/Behavioral: Negative for depression and memory loss. The patient is not nervous/anxious and does not have insomnia.   All other systems reviewed and are negative.   Past Medical History  Diagnosis Date  . Hypothyroidism   . Diabetes ([place])   . Venous stasis dermatitis of both lower extremities   . Kidney disease   . Depression   . Dysthymic disorder   . Paroxysmal atrial fibrillation ([place]) 2007    In June 2015, Cardiac Event Monitor: Mostly SR/sinus arrhythmia with PVCs that are frequent. Short bursts of A. fib lasting several minutes;; CHA2DS2-VASc Score and unadjusted Ischemic Stroke Rate (% per year) is equal to 4.8 % stroke rate/year from a score of 4 (1 pt each for age, female, diabetes, hypertension)  . Asthma   . GERD (gastroesophageal reflux disease)   . Heart murmur     Echocardiogram June 2015: Mild MR (possible vegetation seen on TTE, not seen on TEE), normal LV size with moderate concentric LVH. Normal function EF 60-65%. Normal diastolic function. Mild LA dilation.  . DVT (deep venous thrombosis) ([place])   . Insomnia   . Cystocele   . Neuropathy involving both lower extremities ([place])   . Anxiety and depression   . Obesity, Class II, BMI 35-39.9, with comorbidity ([place])   . Kidney stone   . Adrenal mass ([place])   . Sleep apnea   . Arthritis     Past Surgical History  Procedure Laterality Date  . Hernia repair    . Ankle surgery Right   . Vaginal hysterectomy    . Sleeve gastroplasty    . Lithotripsy    . Kidney surgery    . Transthoracic echocardiogram  08/03/2013    Mild-Moderate concentric LVH, EF 60-65%. Normal diastolic function. Mild LA dilation. Mild MR with possible vegetation  - not confirmed on TEE   . Transesophageal  echocardiogram  08/08/2013    Mild LVH, EF 60-65%. Moderate LA dilation and mild RA dilation. Mild MR with no evidence of stenosis and no evidence of endocarditis. A false chordae is noted.  . Tonsillectomy    . Total knee arthroplasty    . Hiatal hernia repair    . Joint replacement      left knee replacement  . Cholecystectomy    . I&d extremity Left 06/27/2015    Procedure: IRRIGATION AND DEBRIDEMENT EXTREMITY            ( CALF HEMATOMA ) POSSIBLE WOUND VAC;  Surgeon: [name], MD;  Location: [facility] ORS;  Service: Vascular;  Laterality: Left;  . Application of wound vac Left 06/27/2015    Procedure: APPLICATION OF WOUND VAC ( POSSIBLE ) ;  Surgeon: [name], MD;  Location: [facility] ORS;  Service: Vascular;  Laterality: Left;  . Removal of left hematoma    . Nm gated myoview ([facility] hx)  February 2017  Likely breast attenuation. LOW RISK study. Normal EF 55-60%.   ABIs showed right ABI 1.16 and left showed 1.2. Venous duplex the right lower 70 was notable for no DVT, SVT or reflux.   Prior to Admission medications   Medication Sig Start Date End Date Taking? Authorizing Provider  albuterol (PROVENTIL HFA;VENTOLIN HFA) 108 (90 Base) MCG/ACT inhaler Inhale 1-2 puffs into the lungs every 4 (four) hours as needed for wheezing or shortness of breath. 03/29/15   Bobetta Lime, MD  albuterol (PROVENTIL) (2.5 MG/3ML) 0.083% nebulizer solution Take 3 mLs (2.5 mg total) by nebulization every 6 (six) hours as needed for wheezing or shortness of breath. 02/21/15   Ashok Norris, MD  ALPRAZolam Duanne Moron) 0.5 MG tablet TAKE ONE TABLET TWICE DAILY AS NEEDED 11/08/14   Bobetta Lime, MD  buPROPion (WELLBUTRIN XL) 300 MG 24 hr tablet TAKE ONE TABLET BY MOUTH EVERY DAY 03/29/15   Bobetta Lime, MD  DULoxetine (CYMBALTA) 60 MG capsule Take 1 capsule (60 mg total) by mouth daily. 07/12/15   Milinda Pointer, MD  ESTRACE VAGINAL 0.1 MG/GM vaginal cream INSERT 1 GRAM VAGINALLY 2 TIMES A WEEK 03/21/15   Alanda Slim  Defrancesco, MD  flecainide (TAMBOCOR) 100 MG tablet Take 2 tablets (200 mg total) by mouth as needed (for afib. May take additional 100mg  flecainide six hours later if symptoms persist.). 06/26/15   [name], MD  gabapentin (NEURONTIN) 800 MG tablet Take 1 tablet (800 mg total) by mouth every 6 (six) hours. 07/12/15   Milinda Pointer, MD  HYDROcodone-acetaminophen (NORCO) 5-325 MG tablet Take 1 tablet by mouth every 8 (eight) hours as needed for severe pain. 07/12/15   Milinda Pointer, MD  HYDROcodone-acetaminophen (NORCO/VICODIN) 5-325 MG tablet Take 1 tablet by mouth every 8 (eight) hours as needed for severe pain. 07/12/15   Milinda Pointer, MD  HYDROcodone-acetaminophen (NORCO/VICODIN) 5-325 MG tablet Take 1 tablet by mouth every 8 (eight) hours as needed for severe pain. 07/12/15   Milinda Pointer, MD  levothyroxine (SYNTHROID, LEVOTHROID) 150 MCG tablet Take 1 tablet (150 mcg total) by mouth every morning. 07/20/15   Arnetha Courser, MD  Melatonin 10 MG CAPS Take 20 mg by mouth at bedtime as needed. 07/12/15   Milinda Pointer, MD  metFORMIN (GLUCOPHAGE) 1000 MG tablet One pill by mouth every morning about 15 minutes before morning meal, and two pills 15 minutes before evening meal 07/30/15   Arnetha Courser, MD  metoprolol tartrate (LOPRESSOR) 25 MG tablet Take 1 tablet (25 mg total) by mouth 2 (two) times daily. 07/11/15   [name], MD  mirabegron ER (MYRBETRIQ) 25 MG TB24 tablet Take 1 tablet (25 mg total) by mouth daily. 11/09/14   [name] A McGowan, PA-C  montelukast (SINGULAIR) 10 MG tablet Take 1 tablet (10 mg total) by mouth daily. 07/20/15   Arnetha Courser, MD  pantoprazole (PROTONIX) 40 MG tablet Take 1 tablet (40 mg total) by mouth 2 (two) times daily. If needed; Caution:prolonged use may increase risk of pneumonia, colitis, osteoporosis, anemia 07/30/15   Arnetha Courser, MD  quinapril (ACCUPRIL) 20 MG tablet Take 1 tablet (20 mg total) by mouth daily. 08/26/15   Arnetha Courser, MD      Allergies  Allergen Reactions  . Aspirin Hives  . Penicillins Hives  . Terramycin [Oxytetracycline] Hives    Social History   Social History  . Marital Status: Married    Spouse Name: N/A  . Number of Children: N/A  .  Years of Education: N/A   Social History Main Topics  . Smoking status: Never Smoker   . Smokeless tobacco: Never Used  . Alcohol Use: No  . Drug Use: No  . Sexual Activity: No   Other Topics Concern  . None   Social History Narrative   She is currently married -- for 28 years. Does not work. Does not smoke or take alcohol. She never smoked. She exercises at least 3 days a week since before her gastric surgery.   Marital status reviewed in history of present illness.   family history includes Alcohol abuse in her father; Arthritis in her maternal grandfather, maternal grandmother, mother, paternal grandfather, and paternal grandmother; Breast cancer in her maternal aunt; Cancer in her father and maternal aunt; Cerebral aneurysm in her father; Diabetes in her father, maternal grandmother, and paternal grandmother; Heart disease in her maternal grandfather; Hypertension in her father, maternal grandfather, maternal grandmother, paternal grandfather, and paternal grandmother; Kidney disease in her mother; Mental illness in her sister; Peripheral vascular disease in her father; Skin cancer in her father; Varicose Veins in her mother.   Wt Readings from Last 3 Encounters:  09/11/15 278 lb 4 oz (126.213 kg)  08/28/15 276 lb 8 oz (125.42 kg)  07/30/15 268 lb (121.564 kg)    PHYSICAL EXAM BP 168/72 mmHg  Pulse 58  Ht 5\' 5"  (1.651 m)  Wt 278 lb 4 oz (126.213 kg)  BMI 46.30 kg/m2 General appearance: alert, cooperative, appears stated age, no distress, moderately obese and A pleasant mood with normal affect. Answers questions appropriately. Neck: no adenopathy, no carotid bruit, no JVD and supple, symmetrical, trachea midline Lungs: clear to auscultation  bilaterally, normal percussion bilaterally and Nonlabored, good air movement Heart: Irregularly irregular. Normal rate. Distant, but mostly normal S1 and S2. No notable murmur heard. No rubs or gallops heard. Unable to palpate PMI due to body habitus. Abdomen: Obese but otherwise soft/NT/M.D./NA BS. No HSM palpable due to obesity there Extremities: She actually does not have any edema but had bilateral venous stasis changes- Left leg lesion looks stable with stable scar. Right leg has a Haematologist wrap. Little bit tender but no edema. Pulses: 2+ and symmetric Neurologic: Alert and oriented X 3, normal strength and tone. Normal symmetric reflexes. Normal coordination and gait   Adult ECG Report  Rate: 58 ;  Rhythm: sinus bradycardia and Borderline low voltage. Otherwise normal axis, intervals and durations.;   Narrative Interpretation: Normal/stable EKG.   Other studies Reviewed: Additional studies/ records that were reviewed today include:  Recent Labs:   - Followed by PCP Lab Results  Component Value Date   CHOL 173 08/05/2015   HDL 97 08/05/2015   LDLCALC 66 08/05/2015   TRIG 50 08/05/2015   CHOLHDL 1.8 08/05/2015    ASSESSMENT / PLAN: Problem List Items Addressed This Visit    Preoperative cardiovascular examination    As noted in my last clinic visit. The patient is a low risk patient for low risk surgery.  Recent Myoview was negative. This would significantly reduced risk overall. From a cardiovascular risk standpoint.  PREOPERATIVE CARDIAC RISK ASSESSMENT   Revised Cardiac Risk Index:  High Risk Surgery: no; R shoulder surgery  Defined as Intraperitoneal, intrathoracic or suprainguinal vascular  Active CAD: no; Recent Myoview negative & no prior history  CHF: no; edema is more c/w venous stasis  Cerebrovascular Disease: no;   Diabetes: yes; On Insulin: no  CKD (Cr >~ 2): no; <1  Total:  0  Estimated Risk of Adverse Outcome: VERY LOW  Estimated Risk of MI,  PE, VF/VT (Cardiac Arrest), Complete Heart Block: <1 %; she does have a history of PAF which would put her at risk for recurrent A. Fib, if this occurs, I would use amiodarone to treat perioperatively. She is on Pradaxa was going to be held 48 hours preprocedure.  As for any other preoperative evaluation, she just had a stress test so no further evaluation is required.      Peripheral vascular disease due to secondary diabetes mellitus (HCC) (Chronic)    ABIs per vascular medicine would suggest good in-line flow. She has good pulses. I suspect that she probably has more of venous stasis then PAD. Continue to follow up with vascular surgeon.      Paroxysmal atrial fibrillation (HCC) - Primary (Chronic)    It does not sound like she's had any recurrent episodes of A. fib. She remains on stable dose of beta blocker. Still has pill in the pocket flecainide for when necessary dosing. Low risk/negative Myoview suggesting nonischemic etiology. Normal EF.   This patients CHA2DS2-VASc Score and unadjusted Ischemic Stroke Rate (% per year) is equal to 4.8 % stroke rate/year from a score of 4 - on Pradaxa for anticoagulation. No active bleeding. Would be okay to hold if she has any significant bleeding. Would be okay to hold for surgery 48 hours. Would need to be restarted when safe post op.      Relevant Orders   EKG 12-Lead   Hypertension goal BP (blood pressure) < 150/90 (Chronic)    I do agree that it would be nicer to have her blood pressure little bit lower. Currently she is having some pain issues and therefore I'm reluctant to adjust her medications for her preoperative visit. She really isn't on a blood pressure medication besides the metoprolol which is a low dose, and quinapril 20 mg. May need to consider HCTZ to chlorthalidone during follow-up visits.  We chose a higher systolic pressure to allow for permissive hypertension and avoid dizziness/near syncope. Truly a goal would be 140/80.       Relevant Orders   EKG 12-Lead   Chronic shoulder pain (Right) (Chronic)    It would appear that she has significant degenerative changes in the shoulder with potential plans for reconstructive surgery. Here for preop assessment. -- This would be a low risk surgery from cardiac standpoint.      Chronic anticoagulation  (Chronic)    No active bleeds on Pradaxa. Will need to be held 48 hours preop.      Relevant Orders   EKG 12-Lead      Current medicines are reviewed at length with the patient today. (+/- concerns) none - except when to hold Pradaxa pre-op The following changes have been made: OK to hold 48 hrs pre-op & restart when safe post-op. Studies Ordered:   Orders Placed This Encounter  Procedures  . EKG 12-Lead      Glenetta Hew, M.D., M.S. Interventional Cardiologist   Pager # [phone] Phone # [phone] [address] [place], [place] [address]

## 2015-09-12 ENCOUNTER — Telehealth: Payer: Self-pay | Admitting: Cardiology

## 2015-09-12 NOTE — Telephone Encounter (Signed)
Called Christy to obtain call back number for [facility] to contact their staff to see what is needed.  Phone # listed was the fax # Unable to see where a clearance note was faxed via [employer] - no letter created or documentation of anything being faxed.  Can send office visit note, if that is what is needed, but not able to tell from operator message what is being requested  [facility] @ [phone] to see what is needed.  Had to leave a message.

## 2015-09-12 NOTE — Telephone Encounter (Signed)
New message     The nurse was faxing a clearance for the pt the office called and their printer got jammed the office is asking for the paper work to be re-faxed at [phone]

## 2015-09-13 ENCOUNTER — Other Ambulatory Visit: Payer: Self-pay

## 2015-09-13 ENCOUNTER — Telehealth: Payer: Self-pay | Admitting: Cardiovascular Disease

## 2015-09-13 NOTE — Telephone Encounter (Signed)
Received clearance from PAT for right shoulder arthroplasty. Needs medical clearance to be off blood thinners.  Placed in MD basket

## 2015-09-13 NOTE — Telephone Encounter (Signed)
Pt sees Dr. [name].

## 2015-09-16 ENCOUNTER — Telehealth: Payer: Self-pay | Admitting: Cardiology

## 2015-09-16 NOTE — Telephone Encounter (Signed)
OFFICE NOTE HAS BEEN ROUTED TO [facility]

## 2015-09-16 NOTE — Telephone Encounter (Signed)
Request for surgical clearance:  1. What type of surgery is being performed? Shoulder replacement  2. When is this surgery scheduled? No waiting for cardiac clearance  3. Are there any medications that need to be held prior to surgery and how long?  4. Name of physician performing surgery?  [facility]  5. What is your office phone and fax number?  Fax [phone]  Patient is call for the second time, please call patient.  She is unable to schedule her surgery until she gets cardiac clearance. 6.

## 2015-09-16 NOTE — Telephone Encounter (Signed)
Pls see my most recent note - has Pre-op evaluation.  Glenetta Hew, MD

## 2015-09-17 ENCOUNTER — Encounter: Payer: Self-pay | Admitting: Pain Medicine

## 2015-09-17 ENCOUNTER — Ambulatory Visit: Payer: Self-pay | Admitting: Obstetrics and Gynecology

## 2015-09-17 ENCOUNTER — Ambulatory Visit: Payer: PPO | Attending: Pain Medicine | Admitting: Pain Medicine

## 2015-09-17 VITALS — BP 161/55 | HR 56 | Temp 98.3°F | Resp 18 | Ht 65.0 in | Wt 270.0 lb

## 2015-09-17 DIAGNOSIS — K219 Gastro-esophageal reflux disease without esophagitis: Secondary | ICD-10-CM | POA: Diagnosis not present

## 2015-09-17 DIAGNOSIS — M1711 Unilateral primary osteoarthritis, right knee: Secondary | ICD-10-CM

## 2015-09-17 DIAGNOSIS — Z7984 Long term (current) use of oral hypoglycemic drugs: Secondary | ICD-10-CM | POA: Diagnosis not present

## 2015-09-17 DIAGNOSIS — M67911 Unspecified disorder of synovium and tendon, right shoulder: Secondary | ICD-10-CM

## 2015-09-17 DIAGNOSIS — M792 Neuralgia and neuritis, unspecified: Secondary | ICD-10-CM

## 2015-09-17 DIAGNOSIS — F418 Other specified anxiety disorders: Secondary | ICD-10-CM | POA: Diagnosis not present

## 2015-09-17 DIAGNOSIS — M7541 Impingement syndrome of right shoulder: Secondary | ICD-10-CM | POA: Insufficient documentation

## 2015-09-17 DIAGNOSIS — I10 Essential (primary) hypertension: Secondary | ICD-10-CM | POA: Insufficient documentation

## 2015-09-17 DIAGNOSIS — G8929 Other chronic pain: Secondary | ICD-10-CM | POA: Diagnosis not present

## 2015-09-17 DIAGNOSIS — G4700 Insomnia, unspecified: Secondary | ICD-10-CM | POA: Diagnosis not present

## 2015-09-17 DIAGNOSIS — E1142 Type 2 diabetes mellitus with diabetic polyneuropathy: Secondary | ICD-10-CM | POA: Diagnosis not present

## 2015-09-17 DIAGNOSIS — G4701 Insomnia due to medical condition: Secondary | ICD-10-CM

## 2015-09-17 DIAGNOSIS — M549 Dorsalgia, unspecified: Secondary | ICD-10-CM | POA: Diagnosis not present

## 2015-09-17 DIAGNOSIS — M7981 Nontraumatic hematoma of soft tissue: Secondary | ICD-10-CM | POA: Diagnosis not present

## 2015-09-17 DIAGNOSIS — N952 Postmenopausal atrophic vaginitis: Secondary | ICD-10-CM | POA: Diagnosis not present

## 2015-09-17 DIAGNOSIS — Z79891 Long term (current) use of opiate analgesic: Secondary | ICD-10-CM

## 2015-09-17 DIAGNOSIS — M2550 Pain in unspecified joint: Secondary | ICD-10-CM

## 2015-09-17 DIAGNOSIS — I739 Peripheral vascular disease, unspecified: Secondary | ICD-10-CM | POA: Diagnosis not present

## 2015-09-17 DIAGNOSIS — M159 Polyosteoarthritis, unspecified: Secondary | ICD-10-CM

## 2015-09-17 DIAGNOSIS — I480 Paroxysmal atrial fibrillation: Secondary | ICD-10-CM | POA: Diagnosis not present

## 2015-09-17 DIAGNOSIS — N8110 Cystocele, unspecified: Secondary | ICD-10-CM | POA: Insufficient documentation

## 2015-09-17 DIAGNOSIS — M19011 Primary osteoarthritis, right shoulder: Secondary | ICD-10-CM

## 2015-09-17 DIAGNOSIS — M7581 Other shoulder lesions, right shoulder: Secondary | ICD-10-CM

## 2015-09-17 DIAGNOSIS — M150 Primary generalized (osteo)arthritis: Secondary | ICD-10-CM

## 2015-09-17 DIAGNOSIS — Z79899 Other long term (current) drug therapy: Secondary | ICD-10-CM

## 2015-09-17 DIAGNOSIS — M12811 Other specific arthropathies, not elsewhere classified, right shoulder: Secondary | ICD-10-CM

## 2015-09-17 DIAGNOSIS — Z6841 Body Mass Index (BMI) 40.0 and over, adult: Secondary | ICD-10-CM | POA: Insufficient documentation

## 2015-09-17 DIAGNOSIS — M75110 Incomplete rotator cuff tear or rupture of unspecified shoulder, not specified as traumatic: Secondary | ICD-10-CM | POA: Insufficient documentation

## 2015-09-17 DIAGNOSIS — J4540 Moderate persistent asthma, uncomplicated: Secondary | ICD-10-CM | POA: Diagnosis not present

## 2015-09-17 DIAGNOSIS — L97211 Non-pressure chronic ulcer of right calf limited to breakdown of skin: Secondary | ICD-10-CM | POA: Diagnosis not present

## 2015-09-17 DIAGNOSIS — I890 Lymphedema, not elsewhere classified: Secondary | ICD-10-CM | POA: Diagnosis not present

## 2015-09-17 DIAGNOSIS — M7989 Other specified soft tissue disorders: Secondary | ICD-10-CM | POA: Diagnosis not present

## 2015-09-17 DIAGNOSIS — Z5181 Encounter for therapeutic drug level monitoring: Secondary | ICD-10-CM | POA: Diagnosis not present

## 2015-09-17 DIAGNOSIS — M25519 Pain in unspecified shoulder: Secondary | ICD-10-CM | POA: Diagnosis not present

## 2015-09-17 DIAGNOSIS — G5711 Meralgia paresthetica, right lower limb: Secondary | ICD-10-CM

## 2015-09-17 DIAGNOSIS — M75101 Unspecified rotator cuff tear or rupture of right shoulder, not specified as traumatic: Secondary | ICD-10-CM

## 2015-09-17 DIAGNOSIS — E119 Type 2 diabetes mellitus without complications: Secondary | ICD-10-CM | POA: Diagnosis not present

## 2015-09-17 DIAGNOSIS — M79609 Pain in unspecified limb: Secondary | ICD-10-CM | POA: Diagnosis not present

## 2015-09-17 DIAGNOSIS — M25511 Pain in right shoulder: Secondary | ICD-10-CM

## 2015-09-17 DIAGNOSIS — L239 Allergic contact dermatitis, unspecified cause: Secondary | ICD-10-CM | POA: Diagnosis not present

## 2015-09-17 DIAGNOSIS — Z86718 Personal history of other venous thrombosis and embolism: Secondary | ICD-10-CM | POA: Insufficient documentation

## 2015-09-17 DIAGNOSIS — E039 Hypothyroidism, unspecified: Secondary | ICD-10-CM | POA: Diagnosis not present

## 2015-09-17 DIAGNOSIS — E1140 Type 2 diabetes mellitus with diabetic neuropathy, unspecified: Secondary | ICD-10-CM | POA: Insufficient documentation

## 2015-09-17 DIAGNOSIS — I87091 Postthrombotic syndrome with other complications of right lower extremity: Secondary | ICD-10-CM | POA: Diagnosis not present

## 2015-09-17 DIAGNOSIS — M7918 Myalgia, other site: Secondary | ICD-10-CM

## 2015-09-17 DIAGNOSIS — D1779 Benign lipomatous neoplasm of other sites: Secondary | ICD-10-CM | POA: Diagnosis not present

## 2015-09-17 DIAGNOSIS — M8949 Other hypertrophic osteoarthropathy, multiple sites: Secondary | ICD-10-CM

## 2015-09-17 DIAGNOSIS — G4730 Sleep apnea, unspecified: Secondary | ICD-10-CM | POA: Diagnosis not present

## 2015-09-17 DIAGNOSIS — M25561 Pain in right knee: Secondary | ICD-10-CM

## 2015-09-17 DIAGNOSIS — M791 Myalgia: Secondary | ICD-10-CM

## 2015-09-17 MED ORDER — HYDROCODONE-ACETAMINOPHEN 5-325 MG PO TABS: 1.0000 | ORAL_TABLET | Freq: Three times a day (TID) | ORAL | Status: DC | PRN

## 2015-09-17 MED ORDER — GABAPENTIN 800 MG PO TABS: 800.0000 mg | ORAL_TABLET | Freq: Four times a day (QID) | ORAL | Status: DC

## 2015-09-17 MED ORDER — MELATONIN 10 MG PO CAPS: 20.0000 mg | ORAL_CAPSULE | Freq: Every evening | ORAL | Status: DC | PRN

## 2015-09-17 MED ORDER — DULOXETINE HCL 60 MG PO CPEP: 60.0000 mg | ORAL_CAPSULE | Freq: Every day | ORAL | Status: DC

## 2015-09-17 NOTE — Progress Notes (Signed)
Patient's Name: [name]  Patient type: Established  MRN: [id number]  Service setting: Ambulatory outpatient  DOB: [birth year]  Location: [facility]  DOS: 09/17/2015  Primary Care Physician: [name], DO  Note by: Kathlen Brunswick. Dossie Arbour, M.D, DABA, [place], DABPM, Milagros Evener, FIPP  Referring Physician: Bobetta Lime, MD  Specialty: Board-Certified Interventional Pain Management  Last Visit to Pain Management: 07/25/2015   Primary Reason(s) for Visit: Encounter for prescription drug management (Level of risk: moderate) CC: Shoulder Pain   HPI  [name] is a 65 y.o. year old, female patient, who returns today as an established patient. She has Paroxysmal atrial fibrillation ([place]); Hypertension goal BP (blood pressure) < 150/90; Peripheral vascular disease due to secondary diabetes mellitus ([place]); Apnea, sleep; DM (diabetes mellitus) type II controlled peripheral vascular disorder ([place]); Diabetic peripheral neuropathy associated with type 2 diabetes mellitus ([place]); Asthma, moderate persistent, well-controlled; Hypothyroidism, adult; Anxiety and depression; Allergic contact dermatitis; Bladder cystocele; Gastro-esophageal reflux disease without esophagitis; H/O deep venous thrombosis; Extreme obesity ([place]); Hernia, rectovaginal; Major depression, recurrent, chronic ([place]); Mixed incontinence; Atrophic vaginitis; Myelolipoma of right adrenal gland; Chronic pain; Long term current use of opiate analgesic; Opiate use (15 MME/Day); Encounter for therapeutic drug level monitoring; Lateral femoral cutaneous entrapment syndrome (Right); Chronic shoulder pain (Right); Insomnia secondary to chronic pain; Chronic anticoagulation ; Encounter for preventative adult health care exam with abnormal findings; Preoperative cardiovascular examination; Shoulder Glenoid labral tear (Right); Other shoulder lesions (Right); Rotator cuff syndrome (Right); Rotator cuff partial tear (Right);  Neuropathy of right lateral femoral cutaneous nerve; Rotator cuff Tendinopathy (Right); Osteoarthritis of shoulder (Right); Long term prescription opiate use; Morbid obesity with BMI of 40.0-44.9, adult ([place]); Chronic knee pain (Right); Osteoarthritis of knee (Right); Chronic prescription benzodiazepine use; Neurogenic pain; Joint pain; Arthralgia; Musculoskeletal pain; and Osteoarthritis, multiple sites on her problem list.. Her primarily concern today is the Shoulder Pain   Pain Assessment: Self-Reported Pain Score: 4 , clinically she looks like a 2/10. Reported level is inconsistent with clinical obrservations Information on the proper use of the pain score provided to the patient today. Pain Type: Chronic pain Pain Location: Shoulder Pain Orientation: Right Pain Descriptors / Indicators: Sharp Pain Frequency: Constant  The patient comes into the clinics today for pharmacological management of her chronic pain. I last saw this patient on 07/12/2015. The patient  reports that she does not use illicit drugs. Her body mass index is 44.93 kg/(m^2).  Date of Last Visit: 07/12/15 Service Provided on Last Visit: Med Refill  Controlled Substance Pharmacotherapy Assessment & REMS (Risk Evaluation and Mitigation Strategy)  Analgesic: Hydrocodone/APAP 5/325 one tablet every 8 hours (15 mg/day) MME/day: 15 mg/day Pill Count: Did not bring medication bottles to this appointment. Pharmacokinetics: Onset of action (Liberation/Absorption): Within expected pharmacological parameters Time to Peak effect (Distribution): Timing and results are as within normal expected parameters Duration of action (Metabolism/Excretion): Within normal limits for medication Pharmacodynamics: Analgesic Effect: More than 50% Activity Facilitation: Medication(s) allow patient to sit, stand, walk, and do the basic ADLs Perceived Effectiveness: Described as relatively effective, allowing for increase in activities of daily  living (ADL) Side-effects or Adverse reactions: None reported Monitoring: [place] PMP: Online review of the past 61-month period conducted. Compliant with practice rules and regulations Last UDS on record: TOXASSURE SELECT 13  Date Value Ref Range Status  07/12/2015 FINAL  Final    Comment:    ==================================================================== TOXASSURE SELECT 13 (MW) ==================================================================== Specimen Alert Insufficient specimen volume received to complete testing  for Barbiturates. ==================================================================== Test                             Result       Flag       Units Drug Present and Declared for Prescription Verification   Alprazolam                     25           EXPECTED   ng/mg creat   Alpha-hydroxyalprazolam        30           EXPECTED   ng/mg creat    Source of alprazolam is a scheduled prescription medication.    Alpha-hydroxyalprazolam is an expected metabolite of alprazolam.   Hydrocodone                    558          EXPECTED   ng/mg creat   Norhydrocodone                 463          EXPECTED   ng/mg creat    Sources of hydrocodone include scheduled prescription    medications. Norhydrocodone is an expected metabolite of    hydrocodone. ==================================================================== Test                      Result    Flag   Units      Ref Range   Creatinine              226              mg/dL      >=20 ==================================================================== Declared Medications:  The flagging and interpretation on this report are based on the  following declared medications.  Unexpected results may arise from  inaccuracies in the declared medications.  **Note: The testing scope of this panel includes these medications:  Alprazolam (Xanax)  Hydrocodone (Norco)  **Note: The testing scope of this panel does not include following   reported medications:  Acetaminophen (Norco)  Albuterol  Albuterol (Proventil)  Bupropion (Wellbutrin)  Duloxetine (Cymbalta)  Estradiol  Flecainide (Tambocor)  Gabapentin  Levothyroxine  Melatonin  Metoprolol (Lopressor)  Mirabegron (Myrbetriq)  Montelukast (Singulair)  Pantoprazole (Protonix)  Quinapril ==================================================================== For clinical consultation, please call [phone]. ====================================================================    UDS interpretation: Compliant Patient informed of the CDC guidelines and recommendations to stay away from the concomitant use of benzodiazepines and opioids due to the increased risk of respiratory depression and death. Medication Assessment Form: Reviewed. Patient indicates being compliant with therapy Treatment compliance: Compliant Risk Assessment: Aberrant Behavior: None observed today Substance Use Disorder (SUD) Risk Level: Moderate Risk of opioid abuse or dependence: 0.7-3.0% with doses ? 36 MME/day and 6.1-26% with doses ? 120 MME/day. Opioid Risk Tool (ORT) Score: Total Score: 5 Moderate Risk for SUD (Score between 4-7) Depression Scale Score: PHQ-2: PHQ-2 Total Score: 0 No depression (0) PHQ-9: PHQ-9 Total Score: 0 No depression (0-4)  Pharmacologic Plan: No change in therapy, at this time  Laboratory Chemistry  Inflammation Markers Lab Results  Component Value Date   ESRSEDRATE 12 04/17/2015   CRP 0.6 04/17/2015    Renal Function Lab Results  Component Value Date   BUN 23* 08/05/2015   CREATININE 0.80 08/05/2015   GFRAA >60 08/05/2015   GFRNONAA >60 08/05/2015  Hepatic Function Lab Results  Component Value Date   AST 14* 08/05/2015   ALT 14 08/05/2015   ALBUMIN 3.8 08/05/2015    Electrolytes Lab Results  Component Value Date   NA 139 08/05/2015   K 4.8 08/05/2015   CL 107 08/05/2015   CALCIUM 8.7* 08/05/2015   MG 1.8 04/17/2015    Pain  Modulating Vitamins No results found for: VD25OH, VD125OH2TOT, [id number], [id number], VITAMINB12  Coagulation Parameters Lab Results  Component Value Date   INR 1.24 06/24/2015   LABPROT 15.8* 06/24/2015   APTT 42* 06/24/2015   PLT 264 06/24/2015    Note: Labs Reviewed.  Recent Diagnostic Imaging  Shoulder Imaging: Shoulder-R MR wo contrast:  Results for orders placed during the hospital encounter of 07/26/15  MR Shoulder Right Wo Contrast   Narrative CLINICAL DATA:  Impingement syndrome of the right shoulder. Decreased range of motion and arm weakness. The patient fell May 22, 2015.  EXAM: MRI OF THE RIGHT SHOULDER WITHOUT CONTRAST  TECHNIQUE: Multiplanar, multisequence MR imaging of the shoulder was performed. No intravenous contrast was administered.  COMPARISON:  MRI dated 02/06/2015  FINDINGS: Rotator cuff: Chronic extensive severe rotator cuff tendinopathy/tendinosis as previously demonstrated. Unchanged extensive partial-thickness articular surface tear involving the infraspinatus and supraspinous tendons. Intrasubstance ganglion noted at the musculotendinous junction of the infraspinatus, unchanged. The tear extends almost all the way through the anterior aspect of the supraspinatus tendon. Subscapularis tendon is intact.  Muscles: Slight atrophy of the infraspinatus and supraspinous muscles  Biceps long head: Moderate tendinopathy of the intra-articular portion of the tendon. Partial tear of the tendon at the glenoid.  Acromioclavicular Joint: Os acromiale with secondary arthritic changes. Slight degenerative changes at the [facility] joint.  Glenohumeral Joint: Severe osteoarthritis with full-thickness cartilage loss and prominent marginal osteophytes on the humeral head. Moderate joint effusion with debris in the joint.  Labrum: Diffusely severely degenerated with a tear of the labrum at the biceps anchor as well as an irregular tear of the  severely degenerated posterior superior aspects of the labrum. Inferior labrum cannot be defined.  Bones: Cystic degenerative changes in the greater tuberosity of the proximal humerus.  IMPRESSION: No significant change in the appearance of the right shoulder since the prior study.  1. Severe osteoarthritis of the glenohumeral joint. 2. Severe degeneration of the long head of the biceps tendon with a partial tear of the biceps origin. 3. Extensive partial-thickness articular surface tears of the infraspinatus and supraspinous tendons, unchanged. 4. Os acromiale which could predispose to impingement.   Electronically Signed   By: Lorriane Shire M.D.   On: 07/26/2015 12:33    Knee Imaging: Knee-R DG 4 views:  Results for orders placed during the hospital encounter of 05/22/15  DG Knee Complete 4 Views Right   Narrative CLINICAL DATA:  Acute right knee pain after fall today.  EXAM: RIGHT KNEE - COMPLETE 4+ VIEW  COMPARISON:  None.  FINDINGS: There is no evidence of fracture, dislocation, or joint effusion. Moderate narrowing of both joint spaces is noted. Mild narrowing of patellofemoral space is noted. Soft tissues are unremarkable.  IMPRESSION: Moderate degenerative joint disease. No acute abnormality seen in the right knee.   Electronically Signed   By: Marijo Conception, M.D.   On: 05/23/2015 08:16    Note: Imaging reviewed.  Meds  The patient has a current medication list which includes the following prescription(s): albuterol, albuterol, bupropion, duloxetine, flecainide, gabapentin, hydrocodone-acetaminophen, hydrocodone-acetaminophen, hydrocodone-acetaminophen, levothyroxine, melatonin, metformin, metoprolol tartrate, mirabegron  er, montelukast, pantoprazole, quinapril, alprazolam, and estrace vaginal.  Current Outpatient Prescriptions on File Prior to Visit  Medication Sig  . albuterol (PROVENTIL HFA;VENTOLIN HFA) 108 (90 Base) MCG/ACT inhaler Inhale 1-2  puffs into the lungs every 4 (four) hours as needed for wheezing or shortness of breath.  Marland Kitchen albuterol (PROVENTIL) (2.5 MG/3ML) 0.083% nebulizer solution Take 3 mLs (2.5 mg total) by nebulization every 6 (six) hours as needed for wheezing or shortness of breath.  Marland Kitchen buPROPion (WELLBUTRIN XL) 300 MG 24 hr tablet TAKE ONE TABLET BY MOUTH EVERY DAY  . flecainide (TAMBOCOR) 100 MG tablet Take 2 tablets (200 mg total) by mouth as needed (for afib. May take additional 100mg  flecainide six hours later if symptoms persist.).  Marland Kitchen levothyroxine (SYNTHROID, LEVOTHROID) 150 MCG tablet Take 1 tablet (150 mcg total) by mouth every morning.  . metFORMIN (GLUCOPHAGE) 1000 MG tablet One pill by mouth every morning about 15 minutes before morning meal, and two pills 15 minutes before evening meal (Patient taking differently: 1 pill by mouth every morning about 15 minutes before morning meal, and 1/2 tablet 15 minutes before evening meal)  . metoprolol tartrate (LOPRESSOR) 25 MG tablet Take 1 tablet (25 mg total) by mouth 2 (two) times daily.  . mirabegron ER (MYRBETRIQ) 25 MG TB24 tablet Take 1 tablet (25 mg total) by mouth daily.  . montelukast (SINGULAIR) 10 MG tablet Take 1 tablet (10 mg total) by mouth daily.  . pantoprazole (PROTONIX) 40 MG tablet Take 1 tablet (40 mg total) by mouth 2 (two) times daily. If needed; Caution:prolonged use may increase risk of pneumonia, colitis, osteoporosis, anemia  . quinapril (ACCUPRIL) 20 MG tablet Take 1 tablet (20 mg total) by mouth daily.   No current facility-administered medications on file prior to visit.    ROS  Constitutional: Denies any fever or chills Gastrointestinal: No reported hemesis, hematochezia, vomiting, or acute GI distress Musculoskeletal: Denies any acute onset joint swelling, redness, loss of ROM, or weakness Neurological: No reported episodes of acute onset apraxia, aphasia, dysarthria, agnosia, amnesia, paralysis, loss of coordination, or loss of  consciousness  Allergies  Ms. Zapalac is allergic to aspirin; penicillins; and terramycin.  [place]  Medical:  Ms. Beltz  has a past medical history of Hypothyroidism; Diabetes ([place]); Venous stasis dermatitis of both lower extremities; Kidney disease; Depression; Dysthymic disorder; Paroxysmal atrial fibrillation ([place]) (2007); Asthma; GERD (gastroesophageal reflux disease); Heart murmur; DVT (deep venous thrombosis) ([place]); Insomnia; Cystocele; Neuropathy involving both lower extremities ([place]); Anxiety and depression; Obesity, Class II, BMI 35-39.9, with comorbidity ([place]); Kidney stone; Adrenal mass ([place]); Sleep apnea; and Arthritis. Family: family history includes Alcohol abuse in her father; Arthritis in her maternal grandfather, maternal grandmother, mother, paternal grandfather, and paternal grandmother; Breast cancer in her maternal aunt; Cancer in her father and maternal aunt; Cerebral aneurysm in her father; Diabetes in her father, maternal grandmother, and paternal grandmother; Heart disease in her maternal grandfather; Hypertension in her father, maternal grandfather, maternal grandmother, paternal grandfather, and paternal grandmother; Kidney disease in her mother; Mental illness in her sister; Peripheral vascular disease in her father; Skin cancer in her father; Varicose Veins in her mother. Surgical:  has past surgical history that includes Hernia repair; Ankle surgery (Right); Vaginal hysterectomy; Sleeve Gastroplasty; Lithotripsy; Kidney surgery; transthoracic echocardiogram (08/03/2013); transesophageal echocardiogram (08/08/2013); Tonsillectomy; Total knee arthroplasty; Hiatal hernia repair; Joint replacement; Cholecystectomy; I&D extremity (Left, 06/27/2015); Application if wound vac (Left, 06/27/2015); removal of left hematoma; and NM GATED MYOVIEW [facility] HX) (February 2017). Tobacco:  reports that she has never smoked. She has never used smokeless tobacco. Alcohol:  reports that she does not  drink alcohol. Drug:  reports that she does not use illicit drugs.  Constitutional Exam  Vitals: Blood pressure 161/55, pulse 56, temperature 98.3 F (36.8 C), temperature source Oral, resp. rate 18, height 5\' 5"  (1.651 m), weight 270 lb (122.471 kg), SpO2 97 %. General appearance: Well nourished, well developed, and well hydrated. In no acute distress Calculated BMI/Body habitus: Body mass index is 44.93 kg/(m^2). (>40 kg/m2) Extreme obesity (Class III) - 254% higher incidence of chronic pain Psych/Mental status: Alert and oriented x 3 (person, place, & time) Eyes: PERLA Respiratory: No evidence of acute respiratory distress  Cervical Spine Exam  Inspection: No masses, redness, or swelling Alignment: Symmetrical ROM: Functional: ROM is within functional limits [facility]) Stability: No instability detected Muscle strength & Tone: Functionally intact Sensory: Unimpaired Palpation: No complaints of tenderness  Upper Extremity (UE) Exam    Side: Right upper extremity  Side: Left upper extremity  Inspection: No masses, redness, swelling, or asymmetry  Inspection: No masses, redness, swelling, or asymmetry  ROM:  ROM:  Functional: Decreased ROM at the level of the shoulder   Functional: ROM is within functional limits [facility])  Muscle strength & Tone: Guarding  Muscle strength & Tone: Functionally intact  Sensory: Unimpaired  Sensory: Unimpaired  Palpation: Non-contributory  Palpation: Non-contributory   Thoracic Spine Exam  Inspection: No masses, redness, or swelling Alignment: Symmetrical ROM: Functional: ROM is within functional limits [facility]) Stability: No instability detected Sensory: Unimpaired Muscle strength & Tone: Functionally intact Palpation: No complaints of tenderness  Lumbar Spine Exam  Inspection: No masses, redness, or swelling Alignment: Symmetrical ROM: Functional: ROM is within functional limits [facility]) Stability: No instability detected Muscle strength & Tone:  Functionally intact Sensory: Unimpaired Palpation: No complaints of tenderness Provocative Tests: Lumbar Hyperextension and rotation test: deferred Patrick's Maneuver: deferred  Gait & Posture Assessment  Ambulation: Unassisted Gait: Unaffected Posture: WNL  Lower Extremity Exam    Side: Right lower extremity  Side: Left lower extremity  Inspection: No masses, redness, swelling, or asymmetry ROM:  Inspection: No masses, redness, swelling, or asymmetry ROM:  Functional: ROM is within functional limits [facility])  Functional: ROM is within functional limits [facility])  Muscle strength & Tone: Functionally intact  Muscle strength & Tone: Functionally intact  Sensory: Unimpaired  Sensory: Unimpaired  Palpation: Non-contributory  Palpation: Non-contributory   Assessment & Plan  Primary Diagnosis & Pertinent Problem List: The primary encounter diagnosis was Chronic pain. Diagnoses of Long term current use of opiate analgesic, Encounter for therapeutic drug level monitoring, Diabetic peripheral neuropathy ([place]), Insomnia secondary to chronic pain, Depression with anxiety, Rotator cuff partial tear (Right), Rotator cuff syndrome of right shoulder, Chronic shoulder pain (Right), Other shoulder lesions (Right), Rotator cuff tear arthropathy of right shoulder, Lateral femoral cutaneous entrapment syndrome, right, Neuropathy of right lateral femoral cutaneous nerve, Rotator cuff Tendinopathy (Right), Primary osteoarthritis of right shoulder, Long term prescription opiate use, Morbid obesity with BMI of 40.0-44.9, adult (HCC), Chronic knee pain (Right), Primary osteoarthritis of right knee, Chronic prescription benzodiazepine use, Neurogenic pain, Joint pain, Arthralgia, Musculoskeletal pain, and Primary osteoarthritis involving multiple joints were also pertinent to this visit.  Visit Diagnosis: 1. Chronic pain   2. Long term current use of opiate analgesic   3. Encounter for therapeutic drug level monitoring    4. Diabetic peripheral neuropathy ([place])   5. Insomnia secondary to chronic pain   6.  Depression with anxiety   7. Rotator cuff partial tear (Right)   8. Rotator cuff syndrome of right shoulder   9. Chronic shoulder pain (Right)   10. Other shoulder lesions (Right)   11. Rotator cuff tear arthropathy of right shoulder   12. Lateral femoral cutaneous entrapment syndrome, right   13. Neuropathy of right lateral femoral cutaneous nerve   14. Rotator cuff Tendinopathy (Right)   15. Primary osteoarthritis of right shoulder   16. Long term prescription opiate use   17. Morbid obesity with BMI of 40.0-44.9, adult ([place])   18. Chronic knee pain (Right)   19. Primary osteoarthritis of right knee   20. Chronic prescription benzodiazepine use   21. Neurogenic pain   22. Joint pain   23. Arthralgia   24. Musculoskeletal pain   25. Primary osteoarthritis involving multiple joints     Problems updated and reviewed during this visit: Problem  Rotator cuff syndrome (Right)  Rotator cuff partial tear (Right)  Rotator cuff Tendinopathy (Right)  Osteoarthritis of shoulder (Right)  Chronic knee pain (Right)  Osteoarthritis of knee (Right)  Neurogenic Pain  Joint Pain  Arthralgia  Musculoskeletal Pain  Osteoarthritis, Multiple Sites  Shoulder Glenoid labral tear (Right)  Other shoulder lesions (Right)   Severe rotator cuff tendinopathy/tendinosis; extensive partial thickness articular surface tear involving the infraspinatus and supraspinatus tendons; patient at high risk for full-thickness tear, particularly near the anterior attachment region of the supraspinatus tendon; Moderate tendinopathy involving the intra-articular portion of the long head biceps tendon; Degenerated and likely torn superior labrum; Advanced glenohumeral joint degenerative changes; Moderate AC joint degenerative changes and type 2 acromion; Mild to moderate subacromial/subdeltoid bursitis.   Lateral femoral cutaneous  entrapment syndrome (Right)  Chronic shoulder pain (Right)  Long Term Prescription Opiate Use  Chronic Prescription Benzodiazepine Use  Morbid Obesity With Bmi of 40.0-44.9, Adult (Hcc)  H/O Deep Venous Thrombosis   Right leg   DM (diabetes mellitus) type II controlled peripheral vascular disorder (HCC)  Peripheral vascular disease due to secondary diabetes mellitus (HCC)  Neuropathy of Right Lateral Femoral Cutaneous Nerve    Problem-specific Plan(s): Lateral femoral cutaneous entrapment syndrome (Right) Successfully treated in the past with nerve blocks and radiofrequency ablation.   No new assessment & plan notes have been filed under this hospital service since the last note was generated. Service: Pain Management   Plan of Care   Problem List Items Addressed This Visit      High   Arthralgia (Chronic)   Chronic knee pain (Right) (Chronic)   Relevant Orders   GENICULAR NERVE BLOCK   KNEE INJECTION   KNEE INJECTION   Chronic pain - Primary (Chronic)   Relevant Medications   gabapentin (NEURONTIN) 800 MG tablet   DULoxetine (CYMBALTA) 60 MG capsule   HYDROcodone-acetaminophen (NORCO/VICODIN) 5-325 MG tablet   HYDROcodone-acetaminophen (NORCO/VICODIN) 5-325 MG tablet   HYDROcodone-acetaminophen (NORCO/VICODIN) 5-325 MG tablet   Chronic shoulder pain (Right) (Chronic)   Relevant Orders   SHOULDER INJECTION   SUPRASCAPULAR NERVE BLOCK   Joint pain (Chronic)   Lateral femoral cutaneous entrapment syndrome (Right) (Chronic)    Successfully treated in the past with nerve blocks and radiofrequency ablation.      Relevant Medications   gabapentin (NEURONTIN) 800 MG tablet   DULoxetine (CYMBALTA) 60 MG capsule   Musculoskeletal pain (Chronic)   Neurogenic pain (Chronic)   Osteoarthritis of knee (Right) (Chronic)   Relevant Medications   HYDROcodone-acetaminophen (NORCO/VICODIN) 5-325 MG tablet   HYDROcodone-acetaminophen (NORCO/VICODIN)  5-325 MG tablet    HYDROcodone-acetaminophen (NORCO/VICODIN) 5-325 MG tablet   Other Relevant Orders   GENICULAR NERVE BLOCK   KNEE INJECTION   KNEE INJECTION   Osteoarthritis of shoulder (Right) (Chronic)   Relevant Medications   HYDROcodone-acetaminophen (NORCO/VICODIN) 5-325 MG tablet   HYDROcodone-acetaminophen (NORCO/VICODIN) 5-325 MG tablet   HYDROcodone-acetaminophen (NORCO/VICODIN) 5-325 MG tablet   Other Relevant Orders   SHOULDER INJECTION   SUPRASCAPULAR NERVE BLOCK   Osteoarthritis, multiple sites (Chronic)   Relevant Medications   HYDROcodone-acetaminophen (NORCO/VICODIN) 5-325 MG tablet   HYDROcodone-acetaminophen (NORCO/VICODIN) 5-325 MG tablet   HYDROcodone-acetaminophen (NORCO/VICODIN) 5-325 MG tablet   Other shoulder lesions (Right) (Chronic)   Relevant Orders   SHOULDER INJECTION   SUPRASCAPULAR NERVE BLOCK   Rotator cuff partial tear (Right) (Chronic)   Relevant Orders   SHOULDER INJECTION   SUPRASCAPULAR NERVE BLOCK   Rotator cuff syndrome (Right) (Chronic)   Relevant Orders   SHOULDER INJECTION   SUPRASCAPULAR NERVE BLOCK   Rotator cuff Tendinopathy (Right) (Chronic)   Relevant Orders   SHOULDER INJECTION   SUPRASCAPULAR NERVE BLOCK     Medium   Chronic prescription benzodiazepine use (Chronic)   Encounter for therapeutic drug level monitoring   Long term current use of opiate analgesic (Chronic)   Relevant Orders   ToxASSURE Select 13 (MW), Urine   Long term prescription opiate use (Chronic)     Low   Insomnia secondary to chronic pain (Chronic)   Relevant Medications   gabapentin (NEURONTIN) 800 MG tablet   Melatonin 10 MG CAPS   DULoxetine (CYMBALTA) 60 MG capsule   HYDROcodone-acetaminophen (NORCO/VICODIN) 5-325 MG tablet   HYDROcodone-acetaminophen (NORCO/VICODIN) 5-325 MG tablet   HYDROcodone-acetaminophen (NORCO/VICODIN) 5-325 MG tablet   Morbid obesity with BMI of 40.0-44.9, adult (HCC) (Chronic)     Unprioritized   Neuropathy of right lateral femoral  cutaneous nerve   Relevant Medications   gabapentin (NEURONTIN) 800 MG tablet   DULoxetine (CYMBALTA) 60 MG capsule    Other Visit Diagnoses    Diabetic peripheral neuropathy (HCC)  (Chronic)       Relevant Medications    gabapentin (NEURONTIN) 800 MG tablet    DULoxetine (CYMBALTA) 60 MG capsule    Depression with anxiety        Relevant Medications    DULoxetine (CYMBALTA) 60 MG capsule    Rotator cuff partial tear (Right)  (Chronic)       Relevant Orders    SHOULDER INJECTION    SUPRASCAPULAR NERVE BLOCK        Pharmacotherapy (Medications Ordered): Meds ordered this encounter  Medications  . gabapentin (NEURONTIN) 800 MG tablet    Sig: Take 1 tablet (800 mg total) by mouth every 6 (six) hours.    Dispense:  120 tablet    Refill:  2    Do not place this medication, or any other prescription from our practice, on "Automatic Refill". Patient may have prescription filled one day early if pharmacy is closed on scheduled refill date.  . Melatonin 10 MG CAPS    Sig: Take 20 mg by mouth at bedtime as needed.    Dispense:  60 capsule    Refill:  2    Do not place this medication, or any other prescription from our practice, on "Automatic Refill". Patient may have prescription filled one day early if pharmacy is closed on scheduled refill date.  . DULoxetine (CYMBALTA) 60 MG capsule    Sig: Take 1 capsule (60 mg total) by mouth  daily.    Dispense:  30 capsule    Refill:  2    Do not place this medication, or any other prescription from our practice, on "Automatic Refill". Patient may have prescription filled one day early if pharmacy is closed on scheduled refill date.  Marland Kitchen HYDROcodone-acetaminophen (NORCO/VICODIN) 5-325 MG tablet    Sig: Take 1 tablet by mouth every 8 (eight) hours as needed for severe pain.    Dispense:  50 tablet    Refill:  0    Do not add this medication to the electronic "Automatic Refill" notification system. Patient may have prescription filled one day early  if pharmacy is closed on scheduled refill date. Do not fill until: 10/11/15 To last until: 11/10/15  . HYDROcodone-acetaminophen (NORCO/VICODIN) 5-325 MG tablet    Sig: Take 1 tablet by mouth every 8 (eight) hours as needed for severe pain.    Dispense:  50 tablet    Refill:  0    Do not add this medication to the electronic "Automatic Refill" notification system. Patient may have prescription filled one day early if pharmacy is closed on scheduled refill date. Do not fill until: 11/10/15 To last until: 12/10/15  . HYDROcodone-acetaminophen (NORCO/VICODIN) 5-325 MG tablet    Sig: Take 1 tablet by mouth every 8 (eight) hours as needed for severe pain.    Dispense:  50 tablet    Refill:  0    Do not add this medication to the electronic "Automatic Refill" notification system. Patient may have prescription filled one day early if pharmacy is closed on scheduled refill date. Do not fill until: 12/10/15 To last until: 01/09/16    [facility] & Procedure Ordered: Orders Placed This Encounter  Procedures  . GENICULAR NERVE BLOCK  . KNEE INJECTION  . KNEE INJECTION  . SHOULDER INJECTION  . SUPRASCAPULAR NERVE BLOCK  . ToxASSURE Select 13 (MW), Urine    Imaging Ordered: None  Interventional Therapies: Scheduled:  None at this time.    Considering:   1. Diagnostic right intra-articular knee injection with local anesthetic and steroid under fluoroscopic guidance, no sedation.  2. Possible series of 5 Hyalgan knee injections under fluoroscopic guidance, no sedation.  3. Diagnostic right-sided genicular nerve block under fluoroscopic guidance and IV sedation.  4. Possible right-sided genicular nerve radiofrequency ablation under fluoroscopic guidance and IV sedation.  5. Palliative right intra-articular shoulder joint injection with local anesthetic and steroid under fluoroscopic guidance, no sedation.  6. Diagnostic right-sided suprascapular nerve block under fluoroscopic guidance, with or  without sedation.  7. Possible right-sided suprascapular nerve radiofrequency ablation under fluoroscopic guidance and IV sedation.  8. Palliative right lateral femoral cutaneous nerve under fluoroscopic guidance, with or without sedation.  9. Palliative repeat right sided lateral femoral continuous nerve radiofrequency ablation under fluoroscopic guidance and IV sedation.    PRN Procedures:   1. Diagnostic right intra-articular knee injection with local anesthetic and steroid under fluoroscopic guidance, no sedation.  2. Possible series of 5 Hyalgan knee injections under fluoroscopic guidance, no sedation.  3. Diagnostic right-sided genicular nerve block under fluoroscopic guidance and IV sedation.  4. Palliative right intra-articular shoulder joint injection with local anesthetic and steroid under fluoroscopic guidance, no sedation.  5. Diagnostic right-sided suprascapular nerve block under fluoroscopic guidance, with or without sedation.  6. Palliative right lateral femoral cutaneous nerve under fluoroscopic guidance, with or without sedation.  7. Palliative repeat right sided lateral femoral continuous nerve radiofrequency ablation under fluoroscopic guidance and IV sedation.  Referral(s) or Consult(s): None at this time.  New Prescriptions   No medications on file    Medications administered during this visit: Ms. Aland had no medications administered during this visit.  Requested PM Follow-up: Return in about 3 months (around 12/23/2015) for Medication Management, (3-Mo), Procedure (PRN - Patient will call).  Future Appointments Date Time Provider [place]  09/26/2015 10:45 AM [facility] PAT[facility] None  12/18/2015 11:20 AM Milinda Pointer, MD [facility] None    Primary Care Physician: [name], DO Location: [facility] Facility Note by: Kathlen Brunswick. Dossie Arbour, M.D, DABA, DABAPM, DABPM, DABIPP, FIPP  Pain Score Disclaimer: We use the NRS-11  scale. This is a self-reported, subjective measurement of pain severity with only modest accuracy. It is used primarily to identify changes within a particular patient. It must be understood that outpatient pain scales are significantly less accurate that those used for research, where they can be applied under ideal controlled circumstances with minimal exposure to variables. In reality, the score is likely to be a combination of pain intensity and pain affect, where pain affect describes the degree of emotional arousal or changes in action readiness caused by the sensory experience of pain. Factors such as social and work situation, setting, emotional state, anxiety levels, expectation, and prior pain experience may influence pain perception and show large inter-individual differences that may also be affected by time variables.  Patient instructions provided during this appointment: There are no Patient Instructions on file for this visit.

## 2015-09-17 NOTE — Progress Notes (Signed)
Safety precautions to be maintained throughout the outpatient stay will include: orient to surroundings, keep bed in low position, maintain call bell within reach at all times, provide assistance with transfer out of bed and ambulation.  Did not bring medication bottles to this appointment.

## 2015-09-17 NOTE — Telephone Encounter (Signed)
Notified patient that we have the requested form for clearance and that Dr. [name] will be here tomorrow to sign and then someone will fax it over to Dr. Nicholaus Bloom office. Office visit note was routed over to Dr. Nicholaus Bloom office and clearance form to be signed tomorrow when he is here in the office. Number to Dr. Nicholaus Bloom office is [phone] and staff contacts are Tiffany and/or [place] if needed. Patient verbalized understanding of our conversation and had no further questions at this time.

## 2015-09-17 NOTE — Telephone Encounter (Signed)
Spoke with patient and let her know that Dr. [name] had put her cardiac clearance in her most recent office visit note and that I faxed that over to Dr. Nicholaus Bloom office. Patient states that someone from that office said that they need the actual signed form. She provided me with a name and number for Dr. Nicholaus Bloom office and I will call over to discuss with them.

## 2015-09-17 NOTE — Telephone Encounter (Signed)
Per Pam, RN, most recent office notes have been faxed to [facility]

## 2015-09-18 ENCOUNTER — Other Ambulatory Visit: Payer: Self-pay | Admitting: Family Medicine

## 2015-09-18 NOTE — Telephone Encounter (Signed)
Refilled previously by Brayton Mars, MD on 03/21/15 . Last seen by Dr.Cook on 08/28/15. Please advise?

## 2015-09-18 NOTE — Telephone Encounter (Signed)
Faxed clearance to [facility], [phone] and [place], [phone]

## 2015-09-20 ENCOUNTER — Other Ambulatory Visit: Payer: Self-pay

## 2015-09-20 DIAGNOSIS — F329 Major depressive disorder, single episode, unspecified: Secondary | ICD-10-CM

## 2015-09-20 DIAGNOSIS — F32A Depression, unspecified: Secondary | ICD-10-CM

## 2015-09-20 DIAGNOSIS — F419 Anxiety disorder, unspecified: Secondary | ICD-10-CM

## 2015-09-20 MED ORDER — ALPRAZOLAM 0.5 MG PO TABS: ORAL_TABLET | ORAL | Status: DC

## 2015-09-21 ENCOUNTER — Other Ambulatory Visit: Payer: Self-pay | Admitting: Family Medicine

## 2015-09-23 DIAGNOSIS — M159 Polyosteoarthritis, unspecified: Secondary | ICD-10-CM | POA: Insufficient documentation

## 2015-09-23 DIAGNOSIS — M8949 Other hypertrophic osteoarthropathy, multiple sites: Secondary | ICD-10-CM | POA: Insufficient documentation

## 2015-09-23 DIAGNOSIS — M150 Primary generalized (osteo)arthritis: Secondary | ICD-10-CM

## 2015-09-23 DIAGNOSIS — Z79899 Other long term (current) drug therapy: Secondary | ICD-10-CM | POA: Insufficient documentation

## 2015-09-23 DIAGNOSIS — Z6841 Body Mass Index (BMI) 40.0 and over, adult: Secondary | ICD-10-CM

## 2015-09-23 DIAGNOSIS — G5711 Meralgia paresthetica, right lower limb: Secondary | ICD-10-CM | POA: Insufficient documentation

## 2015-09-23 DIAGNOSIS — M2550 Pain in unspecified joint: Secondary | ICD-10-CM | POA: Insufficient documentation

## 2015-09-23 DIAGNOSIS — E66813 Obesity, class 3: Secondary | ICD-10-CM | POA: Insufficient documentation

## 2015-09-23 NOTE — Assessment & Plan Note (Signed)
Successfully treated in the past with nerve blocks and radiofrequency ablation.

## 2015-09-25 DIAGNOSIS — I87091 Postthrombotic syndrome with other complications of right lower extremity: Secondary | ICD-10-CM | POA: Diagnosis not present

## 2015-09-25 DIAGNOSIS — F329 Major depressive disorder, single episode, unspecified: Secondary | ICD-10-CM | POA: Diagnosis not present

## 2015-09-25 DIAGNOSIS — Z7901 Long term (current) use of anticoagulants: Secondary | ICD-10-CM | POA: Diagnosis not present

## 2015-09-25 DIAGNOSIS — Z7984 Long term (current) use of oral hypoglycemic drugs: Secondary | ICD-10-CM | POA: Diagnosis not present

## 2015-09-25 DIAGNOSIS — E119 Type 2 diabetes mellitus without complications: Secondary | ICD-10-CM | POA: Diagnosis not present

## 2015-09-25 DIAGNOSIS — I10 Essential (primary) hypertension: Secondary | ICD-10-CM | POA: Diagnosis not present

## 2015-09-25 DIAGNOSIS — L97812 Non-pressure chronic ulcer of other part of right lower leg with fat layer exposed: Secondary | ICD-10-CM | POA: Diagnosis not present

## 2015-09-25 DIAGNOSIS — J449 Chronic obstructive pulmonary disease, unspecified: Secondary | ICD-10-CM | POA: Diagnosis not present

## 2015-09-26 ENCOUNTER — Encounter
Admission: RE | Admit: 2015-09-26 | Discharge: 2015-09-26 | Disposition: A | Discharge status: Home / Self Care | Payer: PPO | Source: Ambulatory Visit | Attending: Surgery | Admitting: Surgery

## 2015-09-26 DIAGNOSIS — Z01812 Encounter for preprocedural laboratory examination: Secondary | ICD-10-CM | POA: Insufficient documentation

## 2015-09-26 HISTORY — DX: Personal history of other diseases of the digestive system: Z87.19

## 2015-09-26 HISTORY — DX: Peripheral vascular disease, unspecified: I73.9

## 2015-09-26 LAB — BASIC METABOLIC PANEL
ANION GAP: 4 — AB (ref 5–15)
BUN: 17 mg/dL (ref 6–20)
CALCIUM: 8.7 mg/dL — AB (ref 8.9–10.3)
CO2: 29 mmol/L (ref 22–32)
Chloride: 105 mmol/L (ref 101–111)
Creatinine, Ser: 0.84 mg/dL (ref 0.44–1.00)
GFR calc Af Amer: >60 mL/min (ref >60)
GFR calc non Af Amer: >60 mL/min (ref >60)
GLUCOSE: 111 mg/dL — AB (ref 65–99)
Potassium: 5.1 mmol/L (ref 3.5–5.1)
Sodium: 138 mmol/L (ref 135–145)

## 2015-09-26 LAB — URINALYSIS COMPLETE WITH MICROSCOPIC (ARMC ONLY)
Bacteria, UA: NONE SEEN
GLUCOSE, UA: NEGATIVE mg/dL
Hgb urine dipstick: NEGATIVE
Nitrite: NEGATIVE
Protein, ur: 30 mg/dL — AB
Specific Gravity, Urine: 1.028 (ref 1.005–1.030)
pH: 5 (ref 5.0–8.0)

## 2015-09-26 LAB — CBC
HEMATOCRIT: 34.8 % — AB (ref 35.0–47.0)
Hemoglobin: 11.4 g/dL — ABNORMAL LOW (ref 12.0–16.0)
MCH: 27.9 pg (ref 26.0–34.0)
MCHC: 32.8 g/dL (ref 32.0–36.0)
MCV: 85 fL (ref 80.0–100.0)
Platelets: 200 10*3/uL (ref 150–440)
RBC: 4.09 MIL/uL (ref 3.80–5.20)
RDW: 15.6 % — AB (ref 11.5–14.5)
WBC: 4.9 10*3/uL (ref 3.6–11.0)

## 2015-09-26 LAB — PROTIME-INR
INR: 1.18
Prothrombin Time: 15.2 seconds — ABNORMAL HIGH (ref 11.4–15.0)

## 2015-09-26 LAB — SURGICAL PCR SCREEN
MRSA, PCR: NEGATIVE
Staphylococcus aureus: POSITIVE — AB

## 2015-09-26 NOTE — Patient Instructions (Addendum)
  Your procedure is scheduled [id number] 18, 2017 (Tuesday) Report to Same Day Surgery 2nd floor Medical Mall To find out your arrival time please call [phone] between 1PM - 3PM on October 07, 2015 (Monday)  Remember: Instructions that are not followed completely may result in serious medical risk, up to and including death, or upon the discretion of your surgeon and anesthesiologist your surgery may need to be rescheduled.    _x___ 1. Do not eat food or drink liquids after midnight. No gum chewing or hard candies.     __x__ 2. No Alcohol for 24 hours before or after surgery.   __x__3. No Smoking for 24 prior to surgery.   ____  4. Bring all medications with you on the day of surgery if instructed.    __x__ 5. Notify your doctor if there is any change in your medical condition     (cold, fever, infections).     Do not wear jewelry, make-up, hairpins, clips or nail polish.  Do not wear lotions, powders, or perfumes. You may wear deodorant.  Do not shave 48 hours prior to surgery. Men may shave face and neck.  Do not bring valuables to the hospital.    [facility] is not responsible for any belongings or valuables.               Contacts, dentures or bridgework may not be worn into surgery.  Leave your suitcase in the car. After surgery it may be brought to your room.  For patients admitted to the hospital, discharge time is determined by your treatment team.   Patients discharged the day of surgery will not be allowed to drive home.    Please read over the following fact sheets that you were given:   [facility] Preparing for Surgery and or MRSA Information   _x___ Take these medicines the morning of surgery with A SIP OF WATER:    1. Quinapril  2. Gabapentin  3. Metoprolol  4. Protonix (Protonix at bedtime on July 17)  5.  6.  ____ Fleet Enema (as directed)   _x___ Use CHG Soap or sage wipes as directed on instruction sheet   _x___ Use inhalers on the day of surgery and  bring to hospital day of surgery (Albuterol nebulizer the morning of surgery, and bring your Albuterol inhaler with you to hospital)  _x___ Stop metformin 2 days prior to surgery (Stop Metformin on July 16 )    ____ Take 1/2 of usual insulin dose the night before surgery and none on the morning of  surgery         _x__ Stop aspirin or coumadin, or plavix (Stop Pradaxa on July 16 per Dr. [name] instructions)  _x__ Stop Anti-inflammatories such as Advil, Aleve, Ibuprofen, Motrin, Naproxen,          Naprosyn, Goodies powders or aspirin products. Ok to take Tylenol.   _x___ Stop supplements until after surgery.  (Stop Melatonin one week prior to surgery)  _x___ Bring C-Pap to the hospital.

## 2015-09-27 LAB — TOXASSURE SELECT 13 (MW), URINE: PDF: 0

## 2015-09-27 NOTE — Pre-Procedure Instructions (Signed)
Spoke with Kieth Brightly in blood bank regarding Anti E result, lab will take care of this, SDS does not need to do anything further.

## 2015-09-27 NOTE — Pre-Procedure Instructions (Signed)
Received call from Dr. Nicholaus Bloom nurse Tiffany, Dr. Roland Rack ordered to cancel Clindamycin and give Vancomycin 1 gram IVPB on call to OR.

## 2015-09-27 NOTE — Pre-Procedure Instructions (Signed)
Nasal swab results faxed to Dr. Roland Rack, spoke with Tiffany, yes she received the fax and will give it to Dr. Roland Rack to review. Tiffany will call with orders.

## 2015-09-28 LAB — URINE CULTURE: Culture: >=100000 — AB

## 2015-09-30 ENCOUNTER — Encounter: Payer: PPO | Admitting: Pain Medicine

## 2015-09-30 NOTE — Pre-Procedure Instructions (Signed)
Urine culture, and sensitivity report faxed to Dr. Roland Rack office.

## 2015-10-01 DIAGNOSIS — M7989 Other specified soft tissue disorders: Secondary | ICD-10-CM | POA: Diagnosis not present

## 2015-10-01 DIAGNOSIS — I10 Essential (primary) hypertension: Secondary | ICD-10-CM | POA: Diagnosis not present

## 2015-10-01 DIAGNOSIS — M7981 Nontraumatic hematoma of soft tissue: Secondary | ICD-10-CM | POA: Diagnosis not present

## 2015-10-01 DIAGNOSIS — I87091 Postthrombotic syndrome with other complications of right lower extremity: Secondary | ICD-10-CM | POA: Diagnosis not present

## 2015-10-01 DIAGNOSIS — M549 Dorsalgia, unspecified: Secondary | ICD-10-CM | POA: Diagnosis not present

## 2015-10-01 DIAGNOSIS — I890 Lymphedema, not elsewhere classified: Secondary | ICD-10-CM | POA: Diagnosis not present

## 2015-10-01 DIAGNOSIS — L97211 Non-pressure chronic ulcer of right calf limited to breakdown of skin: Secondary | ICD-10-CM | POA: Diagnosis not present

## 2015-10-01 DIAGNOSIS — E119 Type 2 diabetes mellitus without complications: Secondary | ICD-10-CM | POA: Diagnosis not present

## 2015-10-01 DIAGNOSIS — M79609 Pain in unspecified limb: Secondary | ICD-10-CM | POA: Diagnosis not present

## 2015-10-08 ENCOUNTER — Encounter
Admission: RE | Disposition: A | Discharge status: Skilled Nursing Facility | Payer: Self-pay | Source: Ambulatory Visit | Attending: Internal Medicine

## 2015-10-08 ENCOUNTER — Inpatient Hospital Stay: Payer: PPO | Admitting: Anesthesiology

## 2015-10-08 ENCOUNTER — Encounter: Payer: Self-pay | Admitting: *Deleted

## 2015-10-08 ENCOUNTER — Inpatient Hospital Stay: Payer: PPO

## 2015-10-08 ENCOUNTER — Inpatient Hospital Stay
Admission: RE | Admit: 2015-10-08 | Discharge: 2015-10-13 | DRG: 483 | Disposition: A | Discharge status: Skilled Nursing Facility | Payer: PPO | Source: Ambulatory Visit | Attending: Internal Medicine | Admitting: Internal Medicine

## 2015-10-08 DIAGNOSIS — I959 Hypotension, unspecified: Secondary | ICD-10-CM | POA: Diagnosis not present

## 2015-10-08 DIAGNOSIS — Z87442 Personal history of urinary calculi: Secondary | ICD-10-CM

## 2015-10-08 DIAGNOSIS — G8918 Other acute postprocedural pain: Secondary | ICD-10-CM | POA: Diagnosis not present

## 2015-10-08 DIAGNOSIS — I480 Paroxysmal atrial fibrillation: Secondary | ICD-10-CM | POA: Diagnosis present

## 2015-10-08 DIAGNOSIS — Z96611 Presence of right artificial shoulder joint: Secondary | ICD-10-CM

## 2015-10-08 DIAGNOSIS — Z7902 Long term (current) use of antithrombotics/antiplatelets: Secondary | ICD-10-CM

## 2015-10-08 DIAGNOSIS — G4733 Obstructive sleep apnea (adult) (pediatric): Secondary | ICD-10-CM | POA: Diagnosis not present

## 2015-10-08 DIAGNOSIS — F329 Major depressive disorder, single episode, unspecified: Secondary | ICD-10-CM | POA: Diagnosis present

## 2015-10-08 DIAGNOSIS — J189 Pneumonia, unspecified organism: Secondary | ICD-10-CM | POA: Diagnosis not present

## 2015-10-08 DIAGNOSIS — R0902 Hypoxemia: Secondary | ICD-10-CM | POA: Diagnosis not present

## 2015-10-08 DIAGNOSIS — E1140 Type 2 diabetes mellitus with diabetic neuropathy, unspecified: Secondary | ICD-10-CM | POA: Diagnosis not present

## 2015-10-08 DIAGNOSIS — Z79899 Other long term (current) drug therapy: Secondary | ICD-10-CM | POA: Diagnosis not present

## 2015-10-08 DIAGNOSIS — Z86718 Personal history of other venous thrombosis and embolism: Secondary | ICD-10-CM | POA: Diagnosis not present

## 2015-10-08 DIAGNOSIS — K921 Melena: Secondary | ICD-10-CM | POA: Diagnosis not present

## 2015-10-08 DIAGNOSIS — F419 Anxiety disorder, unspecified: Secondary | ICD-10-CM | POA: Diagnosis present

## 2015-10-08 DIAGNOSIS — E875 Hyperkalemia: Secondary | ICD-10-CM | POA: Diagnosis not present

## 2015-10-08 DIAGNOSIS — F341 Dysthymic disorder: Secondary | ICD-10-CM | POA: Diagnosis not present

## 2015-10-08 DIAGNOSIS — M7581 Other shoulder lesions, right shoulder: Secondary | ICD-10-CM | POA: Diagnosis not present

## 2015-10-08 DIAGNOSIS — J440 Chronic obstructive pulmonary disease with acute lower respiratory infection: Secondary | ICD-10-CM | POA: Diagnosis present

## 2015-10-08 DIAGNOSIS — R0602 Shortness of breath: Secondary | ICD-10-CM

## 2015-10-08 DIAGNOSIS — I10 Essential (primary) hypertension: Secondary | ICD-10-CM | POA: Diagnosis present

## 2015-10-08 DIAGNOSIS — K625 Hemorrhage of anus and rectum: Secondary | ICD-10-CM | POA: Diagnosis not present

## 2015-10-08 DIAGNOSIS — M75111 Incomplete rotator cuff tear or rupture of right shoulder, not specified as traumatic: Secondary | ICD-10-CM | POA: Diagnosis not present

## 2015-10-08 DIAGNOSIS — K219 Gastro-esophageal reflux disease without esophagitis: Secondary | ICD-10-CM | POA: Diagnosis present

## 2015-10-08 DIAGNOSIS — Z471 Aftercare following joint replacement surgery: Secondary | ICD-10-CM | POA: Diagnosis not present

## 2015-10-08 DIAGNOSIS — M19011 Primary osteoarthritis, right shoulder: Principal | ICD-10-CM | POA: Diagnosis present

## 2015-10-08 DIAGNOSIS — Z6841 Body Mass Index (BMI) 40.0 and over, adult: Secondary | ICD-10-CM

## 2015-10-08 DIAGNOSIS — Z7984 Long term (current) use of oral hypoglycemic drugs: Secondary | ICD-10-CM

## 2015-10-08 DIAGNOSIS — E039 Hypothyroidism, unspecified: Secondary | ICD-10-CM | POA: Diagnosis present

## 2015-10-08 DIAGNOSIS — M12811 Other specific arthropathies, not elsewhere classified, right shoulder: Secondary | ICD-10-CM | POA: Diagnosis not present

## 2015-10-08 DIAGNOSIS — E1151 Type 2 diabetes mellitus with diabetic peripheral angiopathy without gangrene: Secondary | ICD-10-CM | POA: Diagnosis not present

## 2015-10-08 DIAGNOSIS — R710 Precipitous drop in hematocrit: Secondary | ICD-10-CM | POA: Diagnosis not present

## 2015-10-08 DIAGNOSIS — E66813 Obesity, class 3: Secondary | ICD-10-CM

## 2015-10-08 DIAGNOSIS — Z96652 Presence of left artificial knee joint: Secondary | ICD-10-CM | POA: Diagnosis present

## 2015-10-08 DIAGNOSIS — K5900 Constipation, unspecified: Secondary | ICD-10-CM | POA: Diagnosis not present

## 2015-10-08 DIAGNOSIS — Z79890 Hormone replacement therapy (postmenopausal): Secondary | ICD-10-CM | POA: Diagnosis not present

## 2015-10-08 DIAGNOSIS — M25811 Other specified joint disorders, right shoulder: Secondary | ICD-10-CM | POA: Diagnosis not present

## 2015-10-08 DIAGNOSIS — Z8261 Family history of arthritis: Secondary | ICD-10-CM

## 2015-10-08 DIAGNOSIS — J441 Chronic obstructive pulmonary disease with (acute) exacerbation: Secondary | ICD-10-CM | POA: Diagnosis present

## 2015-10-08 DIAGNOSIS — K922 Gastrointestinal hemorrhage, unspecified: Secondary | ICD-10-CM | POA: Diagnosis not present

## 2015-10-08 DIAGNOSIS — Z833 Family history of diabetes mellitus: Secondary | ICD-10-CM

## 2015-10-08 DIAGNOSIS — Z96619 Presence of unspecified artificial shoulder joint: Secondary | ICD-10-CM

## 2015-10-08 DIAGNOSIS — Z8249 Family history of ischemic heart disease and other diseases of the circulatory system: Secondary | ICD-10-CM

## 2015-10-08 DIAGNOSIS — M25511 Pain in right shoulder: Secondary | ICD-10-CM | POA: Diagnosis not present

## 2015-10-08 HISTORY — PX: REVERSE SHOULDER ARTHROPLASTY: SHX5054

## 2015-10-08 LAB — URINALYSIS COMPLETE WITH MICROSCOPIC (ARMC ONLY)
BACTERIA UA: NONE SEEN
BILIRUBIN URINE: NEGATIVE
GLUCOSE, UA: NEGATIVE mg/dL
Hgb urine dipstick: NEGATIVE
Ketones, ur: NEGATIVE mg/dL
LEUKOCYTES UA: NEGATIVE
Nitrite: NEGATIVE
Protein, ur: NEGATIVE mg/dL
SQUAMOUS EPITHELIAL / LPF: NONE SEEN
Specific Gravity, Urine: 1.013 (ref 1.005–1.030)
WBC, UA: NONE SEEN WBC/hpf (ref 0–5)
pH: 6 (ref 5.0–8.0)

## 2015-10-08 LAB — GLUCOSE, CAPILLARY
GLUCOSE-CAPILLARY: 134 mg/dL — AB (ref 65–99)
GLUCOSE-CAPILLARY: 142 mg/dL — AB (ref 65–99)

## 2015-10-08 SURGERY — ARTHROPLASTY, SHOULDER, TOTAL, REVERSE
Anesthesia: Regional | Site: Shoulder | Laterality: Right | Wound class: Clean

## 2015-10-08 MED ORDER — GABAPENTIN 800 MG PO TABS
800.0000 mg | ORAL_TABLET | Freq: Four times a day (QID) | ORAL | Status: DC
  Filled 2015-10-08 (×2): qty 1

## 2015-10-08 MED ORDER — BUPIVACAINE-EPINEPHRINE (PF) 0.5% -1:200000 IJ SOLN
INTRAMUSCULAR | Status: AC
Start: 2015-10-08 — End: 2015-10-08
  Filled 2015-10-08: qty 30

## 2015-10-08 MED ORDER — ENOXAPARIN SODIUM 40 MG/0.4ML ~~LOC~~ SOLN: 40.0000 mg | SUBCUTANEOUS | Status: DC

## 2015-10-08 MED ORDER — SODIUM CHLORIDE 0.9 % IJ SOLN
INTRAMUSCULAR | Status: AC
  Filled 2015-10-08: qty 50

## 2015-10-08 MED ORDER — MEPERIDINE HCL 25 MG/ML IJ SOLN: 6.2500 mg | INTRAMUSCULAR | Status: DC | PRN

## 2015-10-08 MED ORDER — KETOROLAC TROMETHAMINE 30 MG/ML IJ SOLN
30.0000 mg | Freq: Once | INTRAMUSCULAR | Status: AC
  Administered 2015-10-08: 30 mg via INTRAVENOUS

## 2015-10-08 MED ORDER — FLEET ENEMA 7-19 GM/118ML RE ENEM: 1.0000 | ENEMA | Freq: Once | RECTAL | Status: DC | PRN

## 2015-10-08 MED ORDER — VANCOMYCIN HCL IN DEXTROSE 1-5 GM/200ML-% IV SOLN
1000.0000 mg | Freq: Once | INTRAVENOUS | Status: AC
  Administered 2015-10-08: 1000 mg via INTRAVENOUS

## 2015-10-08 MED ORDER — POTASSIUM CHLORIDE IN NACL 20-0.9 MEQ/L-% IV SOLN
INTRAVENOUS | Status: DC
  Administered 2015-10-08: 14:00:00 via INTRAVENOUS
  Filled 2015-10-08 (×4): qty 1000

## 2015-10-08 MED ORDER — LISINOPRIL 20 MG PO TABS
20.0000 mg | ORAL_TABLET | Freq: Every day | ORAL | Status: DC
  Administered 2015-10-09 – 2015-10-10 (×2): 20 mg via ORAL
  Filled 2015-10-08 (×2): qty 1

## 2015-10-08 MED ORDER — BUPIVACAINE HCL (PF) 0.5 % IJ SOLN
INTRAMUSCULAR | Status: DC | PRN
  Administered 2015-10-08: 25 mL via PERINEURAL

## 2015-10-08 MED ORDER — MONTELUKAST SODIUM 10 MG PO TABS
10.0000 mg | ORAL_TABLET | Freq: Every day | ORAL | Status: DC
  Administered 2015-10-08 – 2015-10-13 (×6): 10 mg via ORAL
  Filled 2015-10-08 (×6): qty 1

## 2015-10-08 MED ORDER — FLECAINIDE ACETATE 100 MG PO TABS
200.0000 mg | ORAL_TABLET | ORAL | Status: DC | PRN
  Administered 2015-10-10: 200 mg via ORAL
  Filled 2015-10-08: qty 2

## 2015-10-08 MED ORDER — EPHEDRINE SULFATE 50 MG/ML IJ SOLN
INTRAMUSCULAR | Status: DC | PRN
  Administered 2015-10-08: 10 mg via INTRAVENOUS

## 2015-10-08 MED ORDER — FENTANYL CITRATE (PF) 100 MCG/2ML IJ SOLN
INTRAMUSCULAR | Status: DC | PRN
  Administered 2015-10-08: 250 ug via INTRAVENOUS

## 2015-10-08 MED ORDER — MELATONIN 5 MG PO TABS
20.0000 mg | ORAL_TABLET | Freq: Every evening | ORAL | Status: DC | PRN
  Filled 2015-10-08: qty 4

## 2015-10-08 MED ORDER — LIDOCAINE HCL (CARDIAC) 20 MG/ML IV SOLN
INTRAVENOUS | Status: DC | PRN
  Administered 2015-10-08: 50 mg via INTRAVENOUS

## 2015-10-08 MED ORDER — HYDROMORPHONE HCL 1 MG/ML IJ SOLN: 0.2500 mg | INTRAMUSCULAR | Status: DC | PRN

## 2015-10-08 MED ORDER — NEOMYCIN-POLYMYXIN B GU 40-200000 IR SOLN
Status: DC | PRN
Start: 2015-10-08 — End: 2015-10-08
  Administered 2015-10-08: 16 mL

## 2015-10-08 MED ORDER — TRANEXAMIC ACID 1000 MG/10ML IV SOLN
INTRAVENOUS | Status: AC
  Filled 2015-10-08: qty 10

## 2015-10-08 MED ORDER — ACETAMINOPHEN 500 MG PO TABS
1000.0000 mg | ORAL_TABLET | Freq: Four times a day (QID) | ORAL | Status: AC
  Administered 2015-10-08 – 2015-10-09 (×4): 1000 mg via ORAL
  Filled 2015-10-08 (×4): qty 2

## 2015-10-08 MED ORDER — LACTATED RINGERS IV SOLN
INTRAVENOUS | Status: DC | PRN
  Administered 2015-10-08: 08:00:00 via INTRAVENOUS

## 2015-10-08 MED ORDER — LEVOTHYROXINE SODIUM 150 MCG PO TABS
150.0000 ug | ORAL_TABLET | Freq: Every morning | ORAL | Status: DC
  Administered 2015-10-08 – 2015-10-10 (×3): 150 ug via ORAL
  Filled 2015-10-08 (×3): qty 1

## 2015-10-08 MED ORDER — KETOROLAC TROMETHAMINE 15 MG/ML IJ SOLN
15.0000 mg | Freq: Four times a day (QID) | INTRAMUSCULAR | Status: DC
Start: 2015-10-08 — End: 2015-10-10
  Administered 2015-10-08 – 2015-10-10 (×9): 15 mg via INTRAVENOUS
  Filled 2015-10-08 (×8): qty 1

## 2015-10-08 MED ORDER — METOCLOPRAMIDE HCL 10 MG PO TABS: 5.0000 mg | ORAL_TABLET | Freq: Three times a day (TID) | ORAL | Status: DC | PRN

## 2015-10-08 MED ORDER — GABAPENTIN 400 MG PO CAPS
800.0000 mg | ORAL_CAPSULE | Freq: Four times a day (QID) | ORAL | Status: DC
  Administered 2015-10-08 – 2015-10-13 (×21): 800 mg via ORAL
  Filled 2015-10-08 (×21): qty 2

## 2015-10-08 MED ORDER — ESTRADIOL 0.1 MG/GM VA CREA
1.0000 | TOPICAL_CREAM | Freq: Every day | VAGINAL | Status: DC
  Administered 2015-10-08 – 2015-10-10 (×3): 1 via VAGINAL
  Filled 2015-10-08: qty 42.5

## 2015-10-08 MED ORDER — FENTANYL CITRATE (PF) 100 MCG/2ML IJ SOLN: 25.0000 ug | INTRAMUSCULAR | Status: DC | PRN

## 2015-10-08 MED ORDER — DOCUSATE SODIUM 100 MG PO CAPS
100.0000 mg | ORAL_CAPSULE | Freq: Two times a day (BID) | ORAL | Status: DC
  Administered 2015-10-08 – 2015-10-13 (×10): 100 mg via ORAL
  Filled 2015-10-08 (×11): qty 1

## 2015-10-08 MED ORDER — KETOROLAC TROMETHAMINE 30 MG/ML IJ SOLN
INTRAMUSCULAR | Status: AC
  Administered 2015-10-08: 30 mg via INTRAVENOUS
  Filled 2015-10-08: qty 1

## 2015-10-08 MED ORDER — OXYCODONE HCL 5 MG PO TABS
5.0000 mg | ORAL_TABLET | ORAL | Status: DC | PRN
  Administered 2015-10-08: 10 mg via ORAL
  Administered 2015-10-09 (×3): 5 mg via ORAL
  Administered 2015-10-09: 10 mg via ORAL
  Administered 2015-10-10: 5 mg via ORAL
  Administered 2015-10-10: 10 mg via ORAL
  Administered 2015-10-10 – 2015-10-12 (×7): 5 mg via ORAL
  Administered 2015-10-12 – 2015-10-13 (×3): 10 mg via ORAL
  Administered 2015-10-13: 5 mg via ORAL
  Administered 2015-10-13: 10 mg via ORAL
  Filled 2015-10-08 (×3): qty 1
  Filled 2015-10-08 (×3): qty 2
  Filled 2015-10-08 (×2): qty 1
  Filled 2015-10-08 (×2): qty 2
  Filled 2015-10-08: qty 1
  Filled 2015-10-08: qty 2
  Filled 2015-10-08: qty 1
  Filled 2015-10-08: qty 2
  Filled 2015-10-08 (×5): qty 1

## 2015-10-08 MED ORDER — OXYCODONE HCL 5 MG/5ML PO SOLN: 5.0000 mg | Freq: Once | ORAL | Status: DC | PRN

## 2015-10-08 MED ORDER — MIRABEGRON ER 25 MG PO TB24
25.0000 mg | ORAL_TABLET | Freq: Every day | ORAL | Status: DC
  Administered 2015-10-08 – 2015-10-13 (×6): 25 mg via ORAL
  Filled 2015-10-08 (×7): qty 1

## 2015-10-08 MED ORDER — BUPIVACAINE-EPINEPHRINE (PF) 0.5% -1:200000 IJ SOLN
INTRAMUSCULAR | Status: DC | PRN
  Administered 2015-10-08: 30 mL via PERINEURAL

## 2015-10-08 MED ORDER — METFORMIN HCL 500 MG PO TABS
1000.0000 mg | ORAL_TABLET | Freq: Every day | ORAL | Status: DC
  Administered 2015-10-09: 1000 mg via ORAL
  Filled 2015-10-08: qty 2

## 2015-10-08 MED ORDER — VANCOMYCIN HCL IN DEXTROSE 1-5 GM/200ML-% IV SOLN
INTRAVENOUS | Status: AC
  Filled 2015-10-08: qty 200

## 2015-10-08 MED ORDER — ACETAMINOPHEN 650 MG RE SUPP: 650.0000 mg | Freq: Four times a day (QID) | RECTAL | Status: DC | PRN

## 2015-10-08 MED ORDER — DULOXETINE HCL 60 MG PO CPEP
60.0000 mg | ORAL_CAPSULE | Freq: Every day | ORAL | Status: DC
  Administered 2015-10-08 – 2015-10-13 (×6): 60 mg via ORAL
  Filled 2015-10-08 (×6): qty 1

## 2015-10-08 MED ORDER — SUGAMMADEX SODIUM 200 MG/2ML IV SOLN
INTRAVENOUS | Status: DC | PRN
  Administered 2015-10-08: 200 mg via INTRAVENOUS

## 2015-10-08 MED ORDER — DEXAMETHASONE SODIUM PHOSPHATE 4 MG/ML IJ SOLN
INTRAMUSCULAR | Status: DC | PRN
  Administered 2015-10-08: 10 mg via INTRAVENOUS

## 2015-10-08 MED ORDER — QUINAPRIL HCL 10 MG PO TABS: 20.0000 mg | ORAL_TABLET | Freq: Every day | ORAL | Status: DC

## 2015-10-08 MED ORDER — VANCOMYCIN HCL IN DEXTROSE 1-5 GM/200ML-% IV SOLN
1000.0000 mg | Freq: Two times a day (BID) | INTRAVENOUS | Status: AC
  Administered 2015-10-08: 1000 mg via INTRAVENOUS
  Filled 2015-10-08: qty 200

## 2015-10-08 MED ORDER — SODIUM CHLORIDE 0.9 % IV SOLN
INTRAVENOUS | Status: DC
  Administered 2015-10-08: 07:00:00 via INTRAVENOUS

## 2015-10-08 MED ORDER — BUPIVACAINE LIPOSOME 1.3 % IJ SUSP
INTRAMUSCULAR | Status: AC
  Filled 2015-10-08: qty 20

## 2015-10-08 MED ORDER — METOPROLOL TARTRATE 25 MG PO TABS
25.0000 mg | ORAL_TABLET | Freq: Two times a day (BID) | ORAL | Status: DC
  Administered 2015-10-08 – 2015-10-13 (×10): 25 mg via ORAL
  Filled 2015-10-08 (×10): qty 1

## 2015-10-08 MED ORDER — HYDROMORPHONE HCL 1 MG/ML IJ SOLN
INTRAMUSCULAR | Status: DC | PRN
  Administered 2015-10-08: 1 mg via INTRAVENOUS

## 2015-10-08 MED ORDER — BISACODYL 10 MG RE SUPP: 10.0000 mg | Freq: Every day | RECTAL | Status: DC | PRN

## 2015-10-08 MED ORDER — ACETAMINOPHEN 10 MG/ML IV SOLN
INTRAVENOUS | Status: AC
  Filled 2015-10-08: qty 100

## 2015-10-08 MED ORDER — TRANEXAMIC ACID 1000 MG/10ML IV SOLN
INTRAVENOUS | Status: DC | PRN
  Administered 2015-10-08: 1000 mg via INTRAVENOUS

## 2015-10-08 MED ORDER — ALBUTEROL SULFATE HFA 108 (90 BASE) MCG/ACT IN AERS: 1.0000 | INHALATION_SPRAY | RESPIRATORY_TRACT | Status: DC | PRN

## 2015-10-08 MED ORDER — ONDANSETRON HCL 4 MG PO TABS: 4.0000 mg | ORAL_TABLET | Freq: Four times a day (QID) | ORAL | Status: DC | PRN

## 2015-10-08 MED ORDER — PANTOPRAZOLE SODIUM 40 MG PO TBEC
40.0000 mg | DELAYED_RELEASE_TABLET | Freq: Every day | ORAL | Status: DC
  Administered 2015-10-08 – 2015-10-13 (×6): 40 mg via ORAL
  Filled 2015-10-08 (×6): qty 1

## 2015-10-08 MED ORDER — MIDAZOLAM HCL 2 MG/2ML IJ SOLN
INTRAMUSCULAR | Status: DC | PRN
  Administered 2015-10-08 (×2): 2 mg via INTRAVENOUS

## 2015-10-08 MED ORDER — PROPOFOL 10 MG/ML IV BOLUS
INTRAVENOUS | Status: DC | PRN
  Administered 2015-10-08: 200 mg via INTRAVENOUS

## 2015-10-08 MED ORDER — DABIGATRAN ETEXILATE MESYLATE 150 MG PO CAPS
150.0000 mg | ORAL_CAPSULE | Freq: Two times a day (BID) | ORAL | Status: DC
  Administered 2015-10-08 – 2015-10-11 (×6): 150 mg via ORAL
  Filled 2015-10-08 (×7): qty 1

## 2015-10-08 MED ORDER — ALBUTEROL SULFATE (2.5 MG/3ML) 0.083% IN NEBU
2.5000 mg | INHALATION_SOLUTION | Freq: Four times a day (QID) | RESPIRATORY_TRACT | Status: DC | PRN
  Administered 2015-10-09 – 2015-10-12 (×6): 2.5 mg via RESPIRATORY_TRACT
  Filled 2015-10-08 (×5): qty 3

## 2015-10-08 MED ORDER — NEOMYCIN-POLYMYXIN B GU 40-200000 IR SOLN
Status: AC
  Filled 2015-10-08: qty 20

## 2015-10-08 MED ORDER — ONDANSETRON HCL 4 MG/2ML IJ SOLN: 4.0000 mg | Freq: Four times a day (QID) | INTRAMUSCULAR | Status: DC | PRN

## 2015-10-08 MED ORDER — SODIUM CHLORIDE 0.9 % IV SOLN
INTRAVENOUS | Status: DC | PRN
  Administered 2015-10-08: 60 mL

## 2015-10-08 MED ORDER — GLYCOPYRROLATE 0.2 MG/ML IJ SOLN
INTRAMUSCULAR | Status: DC | PRN
  Administered 2015-10-08: 0.2 mg via INTRAVENOUS

## 2015-10-08 MED ORDER — BUPROPION HCL ER (XL) 150 MG PO TB24
300.0000 mg | ORAL_TABLET | Freq: Every day | ORAL | Status: DC
  Administered 2015-10-08 – 2015-10-13 (×6): 300 mg via ORAL
  Filled 2015-10-08 (×6): qty 2

## 2015-10-08 MED ORDER — PROMETHAZINE HCL 25 MG/ML IJ SOLN: 6.2500 mg | INTRAMUSCULAR | Status: DC | PRN

## 2015-10-08 MED ORDER — OXYCODONE HCL 5 MG PO TABS: 5.0000 mg | ORAL_TABLET | Freq: Once | ORAL | Status: DC | PRN

## 2015-10-08 MED ORDER — ACETAMINOPHEN 325 MG PO TABS
650.0000 mg | ORAL_TABLET | Freq: Four times a day (QID) | ORAL | Status: DC | PRN
  Administered 2015-10-11 – 2015-10-13 (×6): 650 mg via ORAL
  Filled 2015-10-08 (×6): qty 2

## 2015-10-08 MED ORDER — ACETAMINOPHEN 10 MG/ML IV SOLN
INTRAVENOUS | Status: DC | PRN
  Administered 2015-10-08: 1000 mg via INTRAVENOUS

## 2015-10-08 MED ORDER — DIPHENHYDRAMINE HCL 12.5 MG/5ML PO ELIX: 12.5000 mg | ORAL_SOLUTION | ORAL | Status: DC | PRN

## 2015-10-08 MED ORDER — METOCLOPRAMIDE HCL 5 MG/ML IJ SOLN
5.0000 mg | Freq: Three times a day (TID) | INTRAMUSCULAR | Status: DC | PRN
Start: 2015-10-08 — End: 2015-10-13

## 2015-10-08 MED ORDER — HYDROMORPHONE HCL 1 MG/ML IJ SOLN
0.5000 mg | INTRAMUSCULAR | Status: DC | PRN
Start: 2015-10-08 — End: 2015-10-09

## 2015-10-08 MED ORDER — ALPRAZOLAM 0.5 MG PO TABS
0.5000 mg | ORAL_TABLET | Freq: Two times a day (BID) | ORAL | Status: DC | PRN
  Administered 2015-10-09 – 2015-10-13 (×5): 0.5 mg via ORAL
  Filled 2015-10-08 (×5): qty 1

## 2015-10-08 MED ORDER — MAGNESIUM HYDROXIDE 400 MG/5ML PO SUSP
30.0000 mL | Freq: Every day | ORAL | Status: DC | PRN
  Administered 2015-10-09: 30 mL via ORAL
  Filled 2015-10-08 (×2): qty 30

## 2015-10-08 MED ORDER — ONDANSETRON HCL 4 MG/2ML IJ SOLN
INTRAMUSCULAR | Status: DC | PRN
  Administered 2015-10-08: 4 mg via INTRAVENOUS

## 2015-10-08 MED ORDER — ROCURONIUM BROMIDE 100 MG/10ML IV SOLN
INTRAVENOUS | Status: DC | PRN
  Administered 2015-10-08: 50 mg via INTRAVENOUS
  Administered 2015-10-08: 10 mg via INTRAVENOUS

## 2015-10-08 SURGICAL SUPPLY — 75 items
BASEPLATE GLENOSPHERE 25 (Plate) ×1 IMPLANT
BEARING HUIMERAL STRL 44-36MM (Orthopedic Implant) IMPLANT
BIT DRILL TWIST 2.7 (BIT) ×1 IMPLANT
BLADE SAGITTAL WIDE XTHICK NO (BLADE) ×2 IMPLANT
BRNG HUM STD 36-44 STRL LF (Orthopedic Implant) ×1 IMPLANT
CANISTER SUCT 1200ML W/VALVE (MISCELLANEOUS) ×2 IMPLANT
CANISTER SUCT 3000ML PPV (MISCELLANEOUS) ×4 IMPLANT
CATH TRAY METER 16FR LF (MISCELLANEOUS) ×2 IMPLANT
CHLORAPREP W/TINT 26ML (MISCELLANEOUS) ×2 IMPLANT
COOLER POLAR GLACIER W/PUMP (MISCELLANEOUS) ×2 IMPLANT
CRADLE LAMINECT ARM (MISCELLANEOUS) ×2 IMPLANT
DRAPE IMP U-DRAPE 54X76 (DRAPES) ×4 IMPLANT
DRAPE INCISE IOBAN 66X45 STRL (DRAPES) ×4 IMPLANT
DRAPE INCISE IOBAN 66X60 STRL (DRAPES) ×2 IMPLANT
DRAPE SHEET LG 3/4 BI-LAMINATE (DRAPES) ×4 IMPLANT
DRAPE TABLE BACK 80X90 (DRAPES) ×2 IMPLANT
DRSG OPSITE POSTOP 4X8 (GAUZE/BANDAGES/DRESSINGS) ×2 IMPLANT
ELECT CAUTERY BLADE 6.4 (BLADE) ×2 IMPLANT
GLENOID SPHERE STD STRL 36MM (Orthopedic Implant) ×1 IMPLANT
GLOVE BIO SURGEON STRL SZ7.5 (GLOVE) ×4 IMPLANT
GLOVE BIO SURGEON STRL SZ8 (GLOVE) ×4 IMPLANT
GLOVE BIOGEL PI IND STRL 8 (GLOVE) ×5 IMPLANT
GLOVE BIOGEL PI INDICATOR 8 (GLOVE) ×5
GLOVE INDICATOR 8.0 STRL GRN (GLOVE) ×2 IMPLANT
GOWN STRL REUS W/ TWL LRG LVL3 (GOWN DISPOSABLE) ×2 IMPLANT
GOWN STRL REUS W/ TWL XL LVL3 (GOWN DISPOSABLE) ×1 IMPLANT
GOWN STRL REUS W/TWL LRG LVL3 (GOWN DISPOSABLE) ×4
GOWN STRL REUS W/TWL XL LVL3 (GOWN DISPOSABLE) ×2
HANDPIECE INTERPULSE COAX TIP (DISPOSABLE) ×2
HOOD PEEL AWAY FLYTE STAYCOOL (MISCELLANEOUS) ×6 IMPLANT
HUMERAL BEARING STRL 44-36MM (Orthopedic Implant) ×2 IMPLANT
KIT RM TURNOVER STRD PROC AR (KITS) ×2 IMPLANT
KIT STABILIZATION SHOULDER (MISCELLANEOUS) ×2 IMPLANT
MASK FACE SPIDER DISP (MASK) ×2 IMPLANT
NDL 18GX1X1/2 (RX/OR ONLY) (NEEDLE) ×1 IMPLANT
NDL HYPO 25X1 1.5 SAFETY (NEEDLE) ×1 IMPLANT
NDL MAYO CATGUT SZ1 (NEEDLE) ×1 IMPLANT
NDL MAYO CATGUT SZ4 TPR NDL (NEEDLE) ×1 IMPLANT
NDL MAYO CATGUT SZ5 (NEEDLE)
NDL SAFETY 22GX1.5 (NEEDLE) ×2 IMPLANT
NDL SUT 5 .5 CRC TPR PNT MAYO (NEEDLE) IMPLANT
NEEDLE 18GX1X1/2 (RX/OR ONLY) (NEEDLE) ×2 IMPLANT
NEEDLE HYPO 25X1 1.5 SAFETY (NEEDLE) ×2 IMPLANT
NEEDLE MAYO CATGUT SZ1 (NEEDLE) IMPLANT
NEEDLE MAYO CATGUT SZ4 (NEEDLE) IMPLANT
NEEDLE SPNL 20GX3.5 QUINCKE YW (NEEDLE) ×2 IMPLANT
NS IRRIG 1000ML POUR BTL (IV SOLUTION) ×2 IMPLANT
PACK ARTHROSCOPY SHOULDER (MISCELLANEOUS) ×2 IMPLANT
PAD WRAPON POLAR SHDR UNIV (MISCELLANEOUS) ×1 IMPLANT
PIN THREADED REVERSE (PIN) ×2 IMPLANT
SCREW BONE LOCKING 4.75X35X3.5 (Screw) ×1 IMPLANT
SCREW BONE LOCKING 4.75X40X3.5 (Screw) ×2 IMPLANT
SCREW BONE STRL 6.5MMX45MM (Screw) ×1 IMPLANT
SCREW NON-LOCK 4.75MMX15MM (Screw) ×2 IMPLANT
SCREW NON-LOCK 4.75X20X3.5 (Screw) ×2 IMPLANT
SET HNDPC FAN SPRY TIP SCT (DISPOSABLE) ×1 IMPLANT
SLING ULTRA II M (MISCELLANEOUS) ×2 IMPLANT
SOL .9 NS 3000ML IRR  AL (IV SOLUTION) ×1
SOL .9 NS 3000ML IRR AL (IV SOLUTION) ×1
SOL .9 NS 3000ML IRR UROMATIC (IV SOLUTION) ×1 IMPLANT
SPONGE LAP 18X18 5 PK (GAUZE/BANDAGES/DRESSINGS) ×2 IMPLANT
STAPLER SKIN PROX 35W (STAPLE) ×2 IMPLANT
STEM HUMERAL STRL 8MMX83MM (Stem) ×2 IMPLANT
SUT ETHIBOND 0 MO6 C/R (SUTURE) ×2 IMPLANT
SUT ETHIBOND NAB CT1 #1 30IN (SUTURE) ×2 IMPLANT
SUT FIBERWIRE #2 38 BLUE 1/2 (SUTURE) ×8
SUT VIC AB 0 CT1 36 (SUTURE) ×4 IMPLANT
SUT VIC AB 2-0 CT1 27 (SUTURE) ×4
SUT VIC AB 2-0 CT1 TAPERPNT 27 (SUTURE) ×2 IMPLANT
SUTURE FIBERWR #2 38 BLUE 1/2 (SUTURE) ×4 IMPLANT
SYR 30ML LL (SYRINGE) ×6 IMPLANT
SYRINGE 10CC LL (SYRINGE) ×2 IMPLANT
TRAY HUM STD 44MM (Orthopedic Implant) ×2 IMPLANT
TUBE CONNECTING 6X3/16 (MISCELLANEOUS) ×2 IMPLANT
WRAPON POLAR PAD SHDR UNIV (MISCELLANEOUS) ×2

## 2015-10-08 NOTE — Anesthesia Postprocedure Evaluation (Signed)
Anesthesia Post Note  Patient: [name]  Procedure(s) Performed: Procedure(s) (LRB): REVERSE SHOULDER ARTHROPLASTY (Right)  Patient location during evaluation: PACU Anesthesia Type: General and Regional Level of consciousness: awake and alert and oriented Pain management: pain level controlled Vital Signs Assessment: post-procedure vital signs reviewed and stable Respiratory status: spontaneous breathing, nonlabored ventilation and respiratory function stable Cardiovascular status: blood pressure returned to baseline and stable Postop Assessment: no signs of nausea or vomiting Anesthetic complications: no    Last Vitals:  Filed Vitals:   10/08/15 1116 10/08/15 1131  BP: 176/84 170/79  Pulse: 57 54  Temp:  36.4 C  Resp: 11 10    Last Pain:  Filed Vitals:   10/08/15 1133  PainSc: 0-No pain                 Zale Marcotte

## 2015-10-08 NOTE — H&P (Signed)
Paper H&P to be scanned into permanent record. H&P reviewed. No changes. 

## 2015-10-08 NOTE — NC FL2 (Signed)
[place] LEVEL OF CARE SCREENING TOOL     IDENTIFICATION  Patient Name: [name] Birthdate: [birth year] Sex: female Admission Date (Current Location): 10/08/2015  [place] and [place] Number:  Engineering geologist and Address:  [facility], [address] [place], [place] [address]      Provider Number: [id number]  Attending Physician Name and Address:  [name], MD  Relative Name and Phone Number:       Current Level of Care: Hospital Recommended Level of Care: [place] Prior Approval Number:    Date Approved/Denied:   PASRR Number:  ([id number])  Discharge Plan: SNF    Current Diagnoses: Patient Active Problem List   Diagnosis Date Noted  . Status post reverse total shoulder replacement 10/08/2015  . Rotator cuff syndrome (Right) 09/23/2015  . Rotator cuff partial tear (Right) 09/23/2015  . 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 Rotator cuff Tendinopathy (Right) 09/23/2015  . Osteoarthritis of shoulder (Right) 09/23/2015  . Long term prescription opiate use 09/23/2015  . Morbid obesity with BMI of 40.0-44.9, adult ([place]) 09/23/2015  . Chronic knee pain (Right) 09/23/2015  . Osteoarthritis of knee (Right) 09/23/2015  . Chronic prescription benzodiazepine use 09/23/2015  . Neurogenic pain 09/23/2015  . Joint pain 09/23/2015  . Arthralgia 09/23/2015  . Musculoskeletal pain 09/23/2015  . Osteoarthritis, multiple sites 09/23/2015  . Preoperative cardiovascular examination 09/11/2015  . Encounter for preventative adult health care exam with abnormal findings 08/29/2015  . Shoulder Glenoid labral tear (Right) 08/28/2015  . Other shoulder lesions (Right) 08/28/2015  . Chronic anticoagulation  04/24/2015  . Insomnia secondary to chronic pain 04/17/2015  . Chronic pain 01/16/2015  . Long term current use of opiate analgesic 01/16/2015  . Opiate use (15 MME/Day) 01/16/2015  .  Encounter for therapeutic drug level monitoring 01/16/2015  . Lateral femoral cutaneous entrapment syndrome (Right) 01/16/2015  . Chronic shoulder pain (Right) 01/16/2015  . Mixed incontinence 11/26/2014  . Atrophic vaginitis 11/26/2014  . Myelolipoma of right adrenal gland 11/26/2014  . Major depression, recurrent, chronic ([place]) 10/24/2014  . Anxiety and depression 09/21/2014  . Allergic contact dermatitis 09/21/2014  . Bladder cystocele 09/21/2014  . Gastro-esophageal reflux disease without esophagitis 09/21/2014  . H/O deep venous thrombosis 09/21/2014  . Extreme obesity ([place]) 09/21/2014  . Hernia, rectovaginal 09/21/2014  . DM (diabetes mellitus) type II controlled peripheral vascular disorder ([place]) 08/31/2014  . Diabetic peripheral neuropathy associated with type 2 diabetes mellitus ([place]) 08/31/2014  . Asthma, moderate persistent, well-controlled 08/31/2014  . Hypothyroidism, adult 08/31/2014  . Peripheral vascular disease due to secondary diabetes mellitus ([place]) 12/11/2013  . Apnea, sleep 10/19/2013  . Paroxysmal atrial fibrillation ([place]) 07/27/2013  . Hypertension goal BP (blood pressure) < 150/90 07/27/2013    Orientation RESPIRATION BLADDER Height & Weight     Self, Time, Situation, Place  O2 (1 Liter Oxygen ) Continent Weight: 280 lb (127.007 kg) Height:  5\' 5"  (165.1 cm)  BEHAVIORAL SYMPTOMS/MOOD NEUROLOGICAL BOWEL NUTRITION STATUS   (none )  (none ) Continent Diet (Diet: Heart Healthy/ Carb Modified )  AMBULATORY STATUS COMMUNICATION OF NEEDS Skin   Extensive Assist Verbally Surgical wounds (Incision: Right Shoulder )                       Personal Care Assistance Level of Assistance  Bathing, Feeding, Dressing Bathing Assistance: Limited assistance Feeding assistance: Independent Dressing Assistance: Limited assistance  Functional Limitations Info  Sight, Hearing, Speech Sight Info: Adequate Hearing Info: Adequate Speech Info: Adequate    SPECIAL  CARE FACTORS FREQUENCY  PT (By licensed PT), OT (By licensed OT)     PT Frequency:  (5) OT Frequency:  (5)            Contractures      Additional Factors Info  Code Status, Allergies Code Status Info:  (Full Code. ) Allergies Info:  (Aspirin, Penicillins, Terramycin)           Current Medications (10/08/2015):  This is the current hospital active medication list Current Facility-Administered Medications  Medication Dose Route Frequency Provider Last Rate Last Dose  . 0.9 % NaCl with KCl 20 mEq/ L  infusion   Intravenous Continuous [name], MD 100 mL/hr at 10/08/15 1344    . acetaminophen (TYLENOL) tablet 650 mg  650 mg Oral Q6H PRN [name], MD       Or  . acetaminophen (TYLENOL) suppository 650 mg  650 mg Rectal Q6H PRN [name], MD      . acetaminophen (TYLENOL) tablet 1,000 mg  1,000 mg Oral Q6H [name], MD   1,000 mg at 10/08/15 1707  . albuterol (PROVENTIL) (2.5 MG/3ML) 0.083% nebulizer solution 2.5 mg  2.5 mg Nebulization Q6H PRN [name], MD      . ALPRAZolam Duanne Moron) tablet 0.5 mg  0.5 mg Oral BID PRN [name], MD      . bisacodyl (DULCOLAX) suppository 10 mg  10 mg Rectal Daily PRN [name], MD      . buPROPion (WELLBUTRIN XL) 24 hr tablet 300 mg  300 mg Oral Daily [name], MD   300 mg at 10/08/15 1317  . dabigatran (PRADAXA) capsule 150 mg  150 mg Oral BID [name], MD      . diphenhydrAMINE (BENADRYL) 12.5 MG/5ML elixir 12.5-25 mg  12.5-25 mg Oral Q4H PRN [name], MD      . docusate sodium (COLACE) capsule 100 mg  100 mg Oral BID [name], MD   100 mg at 10/08/15 1317  . DULoxetine (CYMBALTA) DR capsule 60 mg  60 mg Oral Daily [name], MD   60 mg at 10/08/15 1317  . estradiol (ESTRACE) vaginal cream 1 Applicatorful  1 Applicatorful Vaginal Daily [name], MD   1 Applicatorful at [id number] 1344  . flecainide (TAMBOCOR) tablet 200 mg  200 mg Oral PRN [name], MD      . gabapentin (NEURONTIN) capsule 800 mg   800 mg Oral Q6H [name], MD   800 mg at 10/08/15 1707  . HYDROmorphone (DILAUDID) injection 0.5-1 mg  0.5-1 mg Intravenous Q2H PRN [name], MD      . ketorolac (TORADOL) 15 MG/ML injection 15 mg  15 mg Intravenous Q6H [name], MD   15 mg at 10/08/15 1706  . levothyroxine (SYNTHROID, LEVOTHROID) tablet 150 mcg  150 mcg Oral q morning - 10a [name], MD   150 mcg at 10/08/15 1318  . [START ON 10/09/2015] lisinopril (PRINIVIL,ZESTRIL) tablet 20 mg  20 mg Oral Daily [place] Cork Poggi, MD      . magnesium hydroxide (MILK OF MAGNESIA) suspension 30 mL  30 mL Oral Daily PRN [name], MD      . Melatonin TABS 20 mg  20 mg Oral QHS PRN [name], MD      . [  START ON 10/09/2015] metFORMIN (GLUCOPHAGE) tablet 1,000 mg  1,000 mg Oral Q breakfast [name], MD      . metoCLOPramide (REGLAN) tablet 5-10 mg  5-10 mg Oral Q8H PRN [name], MD       Or  . metoCLOPramide (REGLAN) injection 5-10 mg  5-10 mg Intravenous Q8H PRN [name], MD      . metoprolol tartrate (LOPRESSOR) tablet 25 mg  25 mg Oral BID [name], MD      . mirabegron [facility]) tablet 25 mg  25 mg Oral Daily [name], MD   25 mg at 10/08/15 1316  . montelukast (SINGULAIR) tablet 10 mg  10 mg Oral Daily [name], MD   10 mg at 10/08/15 1317  . ondansetron (ZOFRAN) tablet 4 mg  4 mg Oral Q6H PRN [name], MD       Or  . ondansetron [facility]) injection 4 mg  4 mg Intravenous Q6H PRN [name], MD      . oxyCODONE (Oxy IR/ROXICODONE) immediate release tablet 5-10 mg  5-10 mg Oral Q3H PRN [name], MD   10 mg at 10/08/15 1701  . pantoprazole (PROTONIX) EC tablet 40 mg  40 mg Oral Daily [name], MD   40 mg at 10/08/15 1318  . sodium phosphate (FLEET) 7-19 GM/118ML enema 1 enema  1 enema Rectal Once PRN [name], MD      . vancomycin (VANCOCIN) 1-5 GM/200ML-% IVPB           . vancomycin (VANCOCIN) IVPB 1000 mg/200 mL premix  1,000 mg Intravenous Q12H [name], MD          Discharge Medications: Please see discharge summary for a list of discharge medications.  Relevant Imaging Results:  Relevant Lab Results:   Additional Information  (SSN: [id number])  Elijiah Mickley, Bronwen Betters, LCSW

## 2015-10-08 NOTE — Anesthesia Preprocedure Evaluation (Signed)
Anesthesia Evaluation  Patient identified by MRN, date of birth, ID band Patient awake    Reviewed: Allergy & Precautions, NPO status , Patient's Chart, lab work & pertinent test results, reviewed documented beta blocker date and time   History of Anesthesia Complications Negative for: history of anesthetic complications  Airway Mallampati: II  TM Distance: >3 FB Neck ROM: Full    Dental no notable dental hx.  2 crowns on lower molars :   Pulmonary asthma , sleep apnea and Continuous Positive Airway Pressure Ventilation ,    Pulmonary exam normal - rhonchi (-) decreased breath sounds(-) wheezing      Cardiovascular Exercise Tolerance: Good hypertension, Pt. on home beta blockers + dysrhythmias (atrial fibrillation, on pradaxea, held since 7/15)  Rhythm:Regular Rate:Bradycardia - Systolic murmurs and - Diastolic murmurs    Neuro/Psych PSYCHIATRIC DISORDERS Anxiety Depression  Neuromuscular disease    GI/Hepatic Neg liver ROS, GERD  Controlled,  Endo/Other  diabetes, Well Controlled, Type 2, Oral Hypoglycemic AgentsHypothyroidism (synthroid for replacement)   Renal/GU Renal disease (nephrolithiasis)  negative genitourinary   Musculoskeletal  (+) Arthritis , Osteoarthritis,    Abdominal (+) + obese,   Peds  Hematology negative hematology ROS (+)   Anesthesia Other Findings   Reproductive/Obstetrics                             Anesthesia Physical Anesthesia Plan  ASA: III  Anesthesia Plan: General and Regional   Post-op Pain Management: GA combined w/ Regional for post-op pain   Induction: Intravenous  Airway Management Planned: Oral ETT  Additional Equipment:   Intra-op Plan:   Post-operative Plan: Extubation in OR  Informed Consent: I have reviewed the patients History and Physical, chart, labs and discussed the procedure including the risks, benefits and alternatives for the  proposed anesthesia with the patient or authorized representative who has indicated his/her understanding and acceptance.   Dental advisory given  Plan Discussed with:   Anesthesia Plan Comments:         Anesthesia Quick Evaluation

## 2015-10-08 NOTE — Op Note (Signed)
10/08/2015  10:26 AM  Patient:   [name]  Pre-Op Diagnosis:   Impingement/tendinopathy with cuff arthropathy, right shoulder.  Post-Op Diagnosis:   Same.  Procedure:   Reverse right total shoulder arthroplasty.  Surgeon:   Pascal Lux, MD  Assistant:   Cameron Proud, PA-C  Anesthesia:   General endotracheal with an interscalene block placed preoperatively by the anesthesiologist.  Findings:   As above.  Complications:   None  EBL:  250 cc  Fluids:   1800 cc crystalloid  UOP:  350 cc  TT:   None  Drains:   None  Closure:   Staples  Implants:   All press-fit Biomet Comprehensive system with a #8 mini-humeral stem, a 44 mm humeral tray with a standard insert, and a mini-base plate with a 36 mm glenosphere.  Brief Clinical Note:   The patient is a 65 year old female with a long history of progressively worsening right shoulder pain. Her symptoms have progressed despite medications, activity modification, injections, etc. Her history and examination are consistent with impingement/tendinopathy with advanced degenerative joint disease the glenohumeral joint. The patient presents at this time for a reverse right total shoulder arthroplasty.  Procedure:   The patient was brought into the operating room and lain in the supine position on the OR table. After adequate IV sedation was achieved, an interscalene block was placed preoperatively by the anesthesiologist. The patient then underwent general endotracheal intubation and anesthesia before a Foley catheter was inserted and the patient repositioned in the beach chair position using the beach chair positioner. The right shoulder and upper extremity were prepped with ChloraPrep solution before being draped sterilely. Preoperative antibiotics were administered. A standard anterior approach to the shoulder was made through an approximately 4-5 inch incision. The incision was carried down through the subcutaneous tissues to  expose the deltopectoral fascia. The interval between the deltoid and pectoralis muscles was identified and this plane developed, retracting the cephalic vein laterally with the deltoid muscle. The conjoined tendon was identified. Its lateral margin was dissected and the Kolbel self-retraining retractor inserted. The "three sisters" were identified and cauterized. Bursal tissues were removed to improve visualization. The subscapularis tendon was released from its attachment to the lesser tuberosity 1 cm proximal to its insertion and several tagging sutures placed. The inferior capsule was released with care after identifying and protecting the axillary nerve. The proximal humeral cut was made at approximately 30 of retroversion using the extra-medullary guide.   Attention was redirected to the glenoid. The labrum was debrided circumferentially before the center of the glenoid was marked with electrocautery. The guidewire was drilled into the glenoid neck using the appropriate guide. After verifying its position, it was overreamed with the mini-baseplate reamer to create a flat surface. The permanent mini-baseplate was impacted into place. It was stabilized with a 45 x 6.5 mm central screw and four peripheral screws. Locking screws were placed superiorly and inferiorly while nonlocking screws were placed anteriorly and posteriorly. The permanent 36 mm glenosphere was then impacted into place and its Morse taper locking mechanism verified using manual distraction.  Attention was directed to the humeral side. The humeral canal was reamed sequentially beginning with the end-cutting reamer then progressing from a 4 mm reamer up to an 8 mm reamer. This provided excellent circumferential chatter. The canal was broached beginning with a #5 broach and progressing to a #8 broach. This was left in place and a trial reduction performed using the standard trial humeral platform. The  arm demonstrated excellent range of  motion as the hand could be brought across the chest to the opposite shoulder and brought to the top of the patient's head and to the patient's ear. The shoulder appeared stable throughout this range of motion. The joint was dislocated and the trial components removed. The permanent #8 mini-stem was impacted into place with care taken to maintain the appropriate version. The permanent 44 mm humeral platform with the standard insert was put together on the back table and impacted into place. Again, the [facility] taper locking mechanism was verified using manual distraction. The shoulder was relocated using firm two finger pressure and again placed through a range of motion with the findings as described above.  The wound was copiously irrigated with bacitracin saline solution using the jet lavage system before a total of 20 cc of Exparel diluted out to 60 cc with normal saline and 30 cc of 0.5% Sensorcaine with epinephrine was injected into the pericapsular and peri-incisional tissues to help with postoperative analgesia. The subscapularis tendon was reapproximated using #2 FiberWire interrupted sutures. The deltopectoral interval was closed using 2-0 Vicryl interrupted sutures before the subcutaneous tissues also were closed using 2-0 Vicryl interrupted sutures. The skin was closed using staples. Prior to closing the skin, 1 g of transexemic acid in 10 cc of normal saline was injected intra-articularly to help with postoperative bleeding. A sterile occlusive dressing was applied to the wound before the arm was placed into a shoulder immobilizer with an abduction pillow. A Polar Care system also was applied to the shoulder. The patient was then transferred back to a hospital bed before being awakened, extubated, and returned to the recovery room in satisfactory condition after tolerating the procedure well.

## 2015-10-08 NOTE — Anesthesia Procedure Notes (Addendum)
Anesthesia Regional Block:  Interscalene brachial plexus block  Pre-Anesthetic Checklist: ,, timeout performed, Correct Patient, Correct Site, Correct Laterality, Correct Procedure, Correct Position, site marked, Risks and benefits discussed,  Surgical consent,  Pre-op evaluation,  At surgeon's request and post-op pain management  Laterality: Right  Prep: chloraprep       Needles:  Injection technique: Single-shot  Needle Type: Stimiplex     Needle Length: 5cm 5 cm Needle Gauge: 22 and 22 G    Additional Needles:  Procedures: ultrasound guided (picture in chart) Interscalene brachial plexus block Narrative:  Start time: 10/08/2015 7:10 AM End time: 10/08/2015 7:25 AM Injection made incrementally with aspirations every 5 mL.  Performed by: Personally  Anesthesiologist: PENWARDEN, AMY  Additional Notes: Functioning IV was confirmed and monitors were applied.  A 43mm 22ga Stimuplex needle was used. Sterile prep and drape,hand hygiene and sterile gloves were used.  Negative aspiration and negative test dose prior to incremental administration of local anesthetic. The patient tolerated the procedure well.      Procedure Name: Intubation Date/Time: 10/08/2015 7:36 AM Performed by: Rosaria Ferries, Kavion Mancinas Pre-anesthesia Checklist: Patient identified, Emergency Drugs available, Suction available and Patient being monitored Patient Re-evaluated:Patient Re-evaluated prior to inductionOxygen Delivery Method: Circle system utilized Preoxygenation: Pre-oxygenation with 100% oxygen Intubation Type: IV induction Laryngoscope Size: Mac and 3 Grade View: Grade I Tube type: Oral Tube size: 7.0 mm Number of attempts: 1 Placement Confirmation: ETT inserted through vocal cords under direct vision,  positive ETCO2 and breath sounds checked- equal and bilateral Secured at: 21 cm Tube secured with: Tape Dental Injury: Teeth and Oropharynx as per pre-operative assessment

## 2015-10-08 NOTE — Evaluation (Signed)
Physical Therapy Evaluation Patient Details Name: [name] MRN: [id number] DOB: 10-07-1950 Today's Date: 10/08/2015   History of Present Illness  Pt is a 65 yr old female s/p elective R reverse total shoulder replacement 7/18. PMH significant for HTN, DM, peripheral neuropathy, bilat venous stasis dermatitis, paroxysmal AFib, asthma, h/o DVTs, and chronic pain.     Clinical Impression  Prior to admission, pt was mod I versus I with functional mobility and ADLs. Pt has been ambulating household distances with a single point cane in the RUE x 3-4 days due to LE pain, prior to that no AD. Pt lives with her spouse in a one story home with 4 STE; though husband has decreased capacity to provide physical assist. Currently pt is mod A for bed mobility, and CGA for sit <> stand transfers and ambulation x36ft. Pt endorses 5/10 "discomfort, not pain" in the R shoulder, but is declining pain medication at this time.  Pt presents with generalized weakness/deconditioning contributing to decreased independence with functional mobility. Pt would benefit from skilled PT to address noted impairments and functional limitations. Pending progress with acute PT (gait/stair training and LE strengthening), recommend pt discharge to STR when medically appropriate.     Follow Up Recommendations SNF (pending progress)    Equipment Recommendations   Will further assess   Recommendations for Other Services       Precautions / Restrictions Precautions Precautions: Fall;Shoulder Type of Shoulder Precautions: Reverse total shoulder Precaution Comments: Shoulder immobilizer with abduction pillow worn at all times (Right) Restrictions Weight Bearing Restrictions: Yes RUE Weight Bearing: Non weight bearing      Mobility  Bed Mobility Overal bed mobility: Needs Assistance Bed Mobility: Supine to Sit Supine to sit: Mod assist;HOB elevated General bed mobility comments: Vc's for technique; mod assist at trunk  to assume upright; pt able to manage bilat LEs   Transfers Overall transfer level: Needs assistance Equipment used: 1 person hand held assist Transfers: Sit to/from Stand (x 2 trials) Sit to Stand: Min guard General transfer comment: Vc's for technique; increased time; min guard for safety  Ambulation/Gait Ambulation/Gait assistance: Min guard Ambulation Distance (Feet): 10 Feet   Gait Pattern/deviations: Step-through pattern;Decreased step length - right;Decreased step length - left Gait velocity: Decreased   General Gait Details: Intermittent LUE support on IV pole; vc's for sequencing; increased time; min guard for safety  Stairs Stairs:  (Need to assess prior to d/c)  Wheelchair Mobility    Modified Rankin (Stroke Patients Only)       Balance Overall balance assessment: Needs assistance Sitting-balance support: Feet supported;Single upper extremity supported Sitting balance-Leahy Scale: Fair     Standing balance support: No upper extremity supported Standing balance-Leahy Scale: Fair      Pertinent Vitals/Pain Pain Assessment: 0-10 Pain Score: 5  Pain Location: R shoulder Pain Descriptors / Indicators: Discomfort Pain Intervention(s): Limited activity within patient's tolerance;Monitored during session;Repositioned;Ice applied (Pt declining pain meds at end of session; "it's not pain, it's just uncomfortable)  Pt SaO2 dropped from 93% on 1L O2 to 79% on room air following 50ft of ambulation. Pt immediately put back on 1L O2 and SaO2 immediately climbed back up to 96%. Pt asymptomatic.     Home Living Family/patient expects to be discharged to:: Private residence Living Arrangements: Spouse/significant other Available Help at Discharge: Family (decreased capacity to provide physical assist) Type of Home: House Home Access: Stairs to enter Entrance Stairs-Rails: Right Entrance Stairs-Number of Steps: 4 Home Layout: One level Home Equipment:  Walker - 2  wheels;Cane - single point;Bedside commode;Shower seat;Hand held shower head      Prior Function Level of Independence: Independent;Independent with assistive device(s)  Comments: Using cane x 3-4 days d/t LE pain, prior to that independent     Hand Dominance        Extremity/Trunk Assessment   Upper Extremity Assessment: Defer to OT evaluation    Lower Extremity Assessment: Generalized weakness Decreased sensation in stocking pattern bilat LEs (right worse than left)  Cervical / Trunk Assessment: Normal    Communication   Communication: No difficulties  Cognition Arousal/Alertness: Lethargic Behavior During Therapy: WFL for tasks assessed/performed;Flat affect Overall Cognitive Status: Within Functional Limits for tasks assessed    General Comments General comments (skin integrity, edema, etc.): Ace bandaging to RLE mid calf, pt reports wound care team managing ulcer. R shoulder with polar care and shoulder immobilizer + abduction pillow in place. Honeycomb bandage with scant bloody drainage.    Exercises General Exercises - Lower Extremity Ankle Circles/Pumps: AROM Quad Sets: AROM Gluteal Sets: AROM Short Arc Quad: AROM Heel Slides: AROM Hip ABduction/ADduction: AROM All exercises performed bilaterally x 10 reps in supine.      Assessment/Plan    PT Assessment Patient needs continued PT services  PT Diagnosis Difficulty walking;Generalized weakness   PT Problem List Decreased strength;Decreased activity tolerance;Decreased balance;Decreased mobility;Decreased knowledge of use of DME  PT Treatment Interventions DME instruction;Gait training;Stair training;Functional mobility training;Therapeutic activities;Therapeutic exercise;Balance training;Patient/family education   PT Goals (Current goals can be found in the Care Plan section) Acute Rehab PT Goals Patient Stated Goal: To return to PLOF PT Goal Formulation: With patient Time For Goal Achievement:  10/22/15 Potential to Achieve Goals: Good    Frequency BID   Barriers to discharge Assist levels         End of Session Equipment Utilized During Treatment: Gait belt;Oxygen (1L O2) Activity Tolerance: Patient limited by lethargy;Patient limited by fatigue Patient left: in chair;with call bell/phone within reach;with chair alarm set;with SCD's reapplied (Polar care and shoulder immobilizer + abduction pillow in place) Nurse Communication: Mobility status;Precautions         Time: [id number] PT Time Calculation (min) (ACUTE ONLY): 45 min   Charges:         PT G Codes:        Nthony Lefferts, SPT 10/08/2015, 4:57 PM

## 2015-10-08 NOTE — Transfer of Care (Signed)
Immediate Anesthesia Transfer of Care Note  Patient: [name]  Procedure(s) Performed: Procedure(s): REVERSE SHOULDER ARTHROPLASTY (Right)  Patient Location: PACU  Anesthesia Type:General  Level of Consciousness: awake and patient cooperative  Airway & Oxygen Therapy: Patient Spontanous Breathing and Patient connected to face mask oxygen  Post-op Assessment: Report given to RN and Post -op Vital signs reviewed and stable  Post vital signs: Reviewed and stable  Last Vitals:  Filed Vitals:   10/08/15 0615 10/08/15 1046  BP: 197/97 169/48  Pulse: 60   Temp: 36.4 C 36.2 C  Resp:  11    Last Pain:  Filed Vitals:   10/08/15 1048  PainSc: 2          Complications: No apparent anesthesia complications

## 2015-10-09 ENCOUNTER — Encounter: Payer: Self-pay | Admitting: Radiology

## 2015-10-09 ENCOUNTER — Inpatient Hospital Stay: Payer: PPO

## 2015-10-09 DIAGNOSIS — J449 Chronic obstructive pulmonary disease, unspecified: Secondary | ICD-10-CM | POA: Diagnosis not present

## 2015-10-09 DIAGNOSIS — G4730 Sleep apnea, unspecified: Secondary | ICD-10-CM | POA: Diagnosis not present

## 2015-10-09 DIAGNOSIS — R0602 Shortness of breath: Secondary | ICD-10-CM | POA: Diagnosis not present

## 2015-10-09 DIAGNOSIS — E119 Type 2 diabetes mellitus without complications: Secondary | ICD-10-CM | POA: Diagnosis not present

## 2015-10-09 LAB — CBC WITH DIFFERENTIAL/PLATELET
BASOS ABS: 0.1 10*3/uL (ref 0–0.1)
Eosinophils Absolute: 0 10*3/uL (ref 0–0.7)
Eosinophils Relative: 0 %
HEMATOCRIT: 32 % — AB (ref 35.0–47.0)
HEMOGLOBIN: 10.6 g/dL — AB (ref 12.0–16.0)
Lymphs Abs: 0.4 10*3/uL — ABNORMAL LOW (ref 1.0–3.6)
MCH: 28.6 pg (ref 26.0–34.0)
MCHC: 33.2 g/dL (ref 32.0–36.0)
MCV: 86.2 fL (ref 80.0–100.0)
MONO ABS: 1 10*3/uL — AB (ref 0.2–0.9)
Monocytes Relative: 9 %
NEUTROS ABS: 9.8 10*3/uL — AB (ref 1.4–6.5)
Platelets: 234 10*3/uL (ref 150–440)
RBC: 3.71 MIL/uL — ABNORMAL LOW (ref 3.80–5.20)
RDW: 16.2 % — AB (ref 11.5–14.5)
WBC: 11.3 10*3/uL — ABNORMAL HIGH (ref 3.6–11.0)

## 2015-10-09 LAB — BASIC METABOLIC PANEL
ANION GAP: 5 (ref 5–15)
BUN: 20 mg/dL (ref 6–20)
CALCIUM: 8.2 mg/dL — AB (ref 8.9–10.3)
CHLORIDE: 105 mmol/L (ref 101–111)
CO2: 25 mmol/L (ref 22–32)
Creatinine, Ser: 0.97 mg/dL (ref 0.44–1.00)
GFR calc non Af Amer: 60 mL/min — ABNORMAL LOW (ref >60)
GLUCOSE: 126 mg/dL — AB (ref 65–99)
POTASSIUM: 6.8 mmol/L — AB (ref 3.5–5.1)
Sodium: 135 mmol/L (ref 135–145)

## 2015-10-09 LAB — GLUCOSE, CAPILLARY
GLUCOSE-CAPILLARY: 119 mg/dL — AB (ref 65–99)
GLUCOSE-CAPILLARY: 122 mg/dL — AB (ref 65–99)
GLUCOSE-CAPILLARY: 124 mg/dL — AB (ref 65–99)

## 2015-10-09 LAB — BRAIN NATRIURETIC PEPTIDE: B NATRIURETIC PEPTIDE 5: 157 pg/mL — AB (ref 0.0–100.0)

## 2015-10-09 LAB — POTASSIUM: Potassium: 5.5 mmol/L — ABNORMAL HIGH (ref 3.5–5.1)

## 2015-10-09 MED ORDER — INSULIN ASPART 100 UNIT/ML ~~LOC~~ SOLN
0.0000 [IU] | Freq: Three times a day (TID) | SUBCUTANEOUS | Status: DC
  Administered 2015-10-09 – 2015-10-10 (×2): 1 [IU] via SUBCUTANEOUS
  Administered 2015-10-11: 2 [IU] via SUBCUTANEOUS
  Administered 2015-10-11 (×2): 1 [IU] via SUBCUTANEOUS
  Administered 2015-10-12: 2 [IU] via SUBCUTANEOUS
  Filled 2015-10-09 (×5): qty 1
  Filled 2015-10-09: qty 2

## 2015-10-09 MED ORDER — LEVOFLOXACIN IN D5W 500 MG/100ML IV SOLN
500.0000 mg | INTRAVENOUS | Status: DC
  Administered 2015-10-09 – 2015-10-10 (×2): 500 mg via INTRAVENOUS
  Filled 2015-10-09 (×3): qty 100

## 2015-10-09 MED ORDER — IOPAMIDOL (ISOVUE-370) INJECTION 76%
100.0000 mL | Freq: Once | INTRAVENOUS | Status: AC | PRN
  Administered 2015-10-09: 100 mL via INTRAVENOUS

## 2015-10-09 MED ORDER — SODIUM CHLORIDE 0.9 % IV SOLN
INTRAVENOUS | Status: DC
  Administered 2015-10-09: 09:00:00 via INTRAVENOUS

## 2015-10-09 NOTE — Progress Notes (Signed)
PT Cancellation Note  Patient Details Name: [name] MRN: [id number] DOB: Aug 27, 1950   Cancelled Treatment:    Reason Eval/Treat Not Completed: Medical issues which prohibited therapy. Pt with potassium of 6.8 mmol/L and pt not on telemetry. Plan in place to redraw labs later today. Per PT guidelines for elevated potassium, will hold pt at this time. Plan to re-attempt PT treatment when pt is medically appropriate.   Rickie Gange, SPT 10/09/2015, 9:24 AM

## 2015-10-09 NOTE — Care Management Note (Addendum)
Case Management Note  Patient Details  Name: [name] MRN: [id number] Date of Birth: January 24, 1951  Subjective/Objective:                  Met with patient to discuss discharge planning. RN at bedside has patient has some SOB but able to complete full sentences- some audible wheezes noted. She actually talks a lot. O2 is acute. She at first was refusing SNF. She is currently open to [facility] for nursing through vascular for leg dressing changes. She though they were out of network however the home health charges are set by her co-pay through her insurance ~$25/person/visit. I explained that sometimes her insurance would cover SNF at a better rate. She is considering SNF; CSW updated. [facility] is aware that patient has been admitted for shoulder surgery. She will need resumption of HHRN orders and possibly HHPT/OT/SW orders (new) at discharge if she does not agree to SNF. She states she lives with her husband. She states she thinks she has a cane that she can use to ambulate with. PT is recommending SNF at this time.   Action/Plan: RNCM will continue to follow.   Expected Discharge Date:  10/09/15               Expected Discharge Plan:     In-House Referral:     Discharge planning Services     Post Acute Care Choice:  Home Health, Durable Medical Equipment Choice offered to:  Patient  DME Arranged:    DME Agency:     HH Arranged:  RN, PT, Social Work [employer] Agency:  [place]  Status of Service:  In process, will continue to follow  If discussed at Long Length of Stay Meetings, dates discussed:    Additional Comments:  Marshell Garfinkel, RN 10/09/2015, 12:36 PM

## 2015-10-09 NOTE — Clinical Social Work Placement (Signed)
   CLINICAL SOCIAL WORK PLACEMENT  NOTE  Date:  10/09/2015  Patient Details  Name: [name] MRN: [id number] Date of Birth: [birth year]  Clinical Social Work is seeking post-discharge placement for this patient at the [place] level of care (*CSW will initial, date and re-position this form in  chart as items are completed):  Yes   Patient/family provided with [place] Work Department's list of facilities offering this level of care within the geographic area requested by the patient (or if unable, by the patient's family).  Yes   Patient/family informed of their freedom to choose among providers that offer the needed level of care, that participate in Medicare, Medicaid or managed care program needed by the patient, have an available bed and are willing to accept the patient.  Yes   Patient/family informed of [place]'s ownership interest in [facility] and [facility], as well as of the fact that they are under no obligation to receive care at these facilities.  PASRR submitted to EDS on 10/08/15     PASRR number received on 10/08/15     Existing PASRR number confirmed on       FL2 transmitted to all facilities in geographic area requested by pt/family on 10/08/15     FL2 transmitted to all facilities within larger geographic area on       Patient informed that his/her managed care company has contracts with or will negotiate with certain facilities, including the following:        Yes   Patient/family informed of bed offers received.  Patient chooses bed at  [facility] )     Physician recommends and patient chooses bed at      Patient to be transferred to   on  .  Patient to be transferred to facility by       Patient family notified on   of transfer.  Name of family member notified:        PHYSICIAN       Additional Comment:    _______________________________________________ Dulce Martian, Bronwen Betters,  LCSW 10/09/2015, 5:10 PM

## 2015-10-09 NOTE — Progress Notes (Signed)
Pt IV fluids stopped this AM due to elevated BP. MD aware. Will continue to monitor.

## 2015-10-09 NOTE — Progress Notes (Signed)
PT Cancellation Note  Patient Details Name: [name] MRN: [id number] DOB: [birth year]   Cancelled Treatment:    Reason Eval/Treat Not Completed: Medical issues which prohibited therapy. Potassium redrawn, still elevated at 5.5 mmol/L. Pt remains not on telemetry, and is pending CT angio of the chest. Will re-attempt PT treatment this afternoon if pt is medically appropriate.    Javaun Dimperio, SPT  10/09/2015, 11:12 AM

## 2015-10-09 NOTE — Progress Notes (Signed)
Ct showes possible pna- start antibiotics

## 2015-10-09 NOTE — Progress Notes (Addendum)
Physical Therapy Treatment Patient Details Name: Dawn RANUM MRN: [id number] DOB: 02/22/1951 Today's Date: 10/09/2015    History of Present Illness      PT Comments    Spoke with nursing prior to treatment in regards to pt's medical stability in regards to PT treatment. Nursing notes CT scan negative for pulmonary embolism and pt able to be seen. Pt complains of fatigue and general malaise, but agreeable to PT. Pt also notes difficulty breathing/SOB with audible wheezing sounds with exhalation. Pt requires extensive assist for elevating trunk in bed to seated position. Pt takes increased time, but able to mobilize to edge of bed with use of left upper extremity and foot rail. Pt notes right sling feels ill fitted and requests assist. Sling readjusted for more comfortable fit. Mild dizziness with sit that lessens within 5 minutes of sitting. Pt ambulates with min A for steadiness and single point cane; occasional retropulsive tendencies in stand. Ambulation unsteady with small stride and non confident steps and guarded body posture. Pt educated in seated/long sit exercises for Bilateral lower extremities and hand and wrist exercises for right upper extremity. Continue PT to progress strength, endurance, balance to improve functional mobility.   Follow Up Recommendations  SNF     Equipment Recommendations       Recommendations for Other Services       Precautions / Restrictions Precautions Precautions: Fall;Shoulder Type of Shoulder Precautions: Reverse total shoulder Restrictions Weight Bearing Restrictions: Yes RUE Weight Bearing: Non weight bearing    Mobility  Bed Mobility Overal bed mobility: Needs Assistance Bed Mobility: Supine to Sit     Supine to sit: Mod assist;HOB elevated     General bed mobility comments: MIld dizziness noted with sit that does less after 5 minutes sitting. Pt requires assist primarily for trunk elevation. Uses LLE on footboard rail to assist  scooting to edge of bed  Transfers Overall transfer level: Needs assistance Equipment used: 1 person hand held assist;Straight cane Transfers: Sit to/from Stand Sit to Stand: Min assist         General transfer comment: Stands without much physical assist, but requires some assist to steady self in stand initially and situate with [facility] for ambulation  Ambulation/Gait Ambulation/Gait assistance: Min guard;Min assist Ambulation Distance (Feet): 28 Feet Assistive device: Straight cane Gait Pattern/deviations: Step-through pattern;Step-to pattern;Decreased step length - right;Decreased step length - left;Leaning posteriorly Gait velocity: Decreased Gait velocity interpretation: <1.8 ft/sec, indicative of risk for recurrent falls General Gait Details: Slow, effortfull steps with unsteady pattern and inconsistent step lengths; pt also noted to have mild sudden posterior LOB episodes requiring assistance to correct. Breathing quite labored. O2 sats deceased to 90/% on room air with short ambulation.    Stairs            Wheelchair Mobility    Modified Rankin (Stroke Patients Only)       Balance Overall balance assessment: Needs assistance Sitting-balance support: Feet supported;Single extremity supported Sitting balance-Leahy Scale: Fair     Standing balance support: Single extremity supported Standing balance-Leahy Scale: Fair (Fair (-)) Standing balance comment: mild occasional posterior POB requiring Min A for correction                    Cognition Arousal/Alertness: Awake/alert (Fatigued) Behavior During Therapy: WFL for tasks assessed/performed Overall Cognitive Status: Within Functional Limits for tasks assessed  Exercises General Exercises - Upper Extremity Wrist Flexion: AROM;Right;20 reps;Seated Wrist Extension: AROM;Right;20 reps;Seated Digit Composite Flexion: AROM;Right;10 reps General Exercises - Lower  Extremity Ankle Circles/Pumps: AROM;Both;20 reps (long sit) Quad Sets: Strengthening;Both;20 reps (long sit) Gluteal Sets: Strengthening;Both;20 reps (long sit) Long Arc Quad: AROM;Both;10 reps;Seated Hip Flexion/Marching: AROM;Both;10 reps;Seated Other Exercises Other Exercises: Ball squeeze R 2 sets of 10 Other Exercises: Education on tightening sling and positioning RUE in chair while in sling Other Exercises: R wrist radial/ulnar deviation and cw/ccw circles 20x each Other Exercises: Education on benefits of sitting up in chair    General Comments        Pertinent Vitals/Pain Pain Assessment: No/denies pain    Home Living                      Prior Function            PT Goals (current goals can now be found in the care plan section) Progress towards PT goals: Progressing toward goals (slowly)    Frequency  BID    PT Plan Current plan remains appropriate    Co-evaluation             End of Session Equipment Utilized During Treatment: Gait belt;Oxygen Activity Tolerance: Patient limited by fatigue;Other (comment) (SOB) Patient left: in chair;with call bell/phone within reach;with chair alarm set;with family/visitor present     Time: [id number] PT Time Calculation (min) (ACUTE ONLY): 34 min  Charges:  $Gait Training: 8-22 mins $Therapeutic Exercise: 8-22 mins                    G Codes:      Charlaine Dalton, PTA 10/09/2015, 3:33 PM

## 2015-10-09 NOTE — H&P (Signed)
[facility] HOSPITALIST  Medical Consultation  [name] [id number] DOB: 09/23/1950 DOA: 10/08/2015 PCP: [name], DO   Requesting physician:  Dr. Roland Rack Date of consultation: 09/07/2015 Reason for consultation: sob CHIEF COMPLAINT:  No chief complaint on file.   HISTORY OF PRESENT ILLNESS: [name]  is a 65 y.o. female with a known history of  COPD, atrial fibrillation, sleep apnea, history of DVT in the past, GERD and anxiety and depression who is admitted by orthopedics forImpingement/tendinopathy with cuff arthropathy, right shoulder. Patient underwent reverse right total arthroplasty yesterday we were consult to see the patient because of shortness of breath. Patient reports that she has been having episodes of intermittent shortness of breath for the past few months. Last week she had similar type of spell where she started having cough and shortness of breath took her breathing treatment and then things did improve. She also relates or shortness of breath laying flat. Also sometimes with activity. She is currently denying any wheezing or coughing. No chest pain or palpitations. Her heart rate is under control.      PAST MEDICAL HISTORY:   Past Medical History  Diagnosis Date  . Hypothyroidism   . Diabetes ([place])   . Venous stasis dermatitis of both lower extremities   . Kidney disease   . Depression   . Dysthymic disorder   . Paroxysmal atrial fibrillation ([facility]) 2007    In June 2015, Cardiac Event Monitor: Mostly SR/sinus arrhythmia with PVCs that are frequent. Short bursts of A. fib lasting several minutes;; CHA2DS2-VASc Score and unadjusted Ischemic Stroke Rate (% per year) is equal to 4.8 % stroke rate/year from a score of 4 (1 pt each for age, female, diabetes, hypertension)  . Asthma   . GERD (gastroesophageal reflux disease)   . Heart murmur     Echocardiogram June 2015: Mild MR (possible vegetation seen on TTE, not seen on TEE), normal LV size with moderate  concentric LVH. Normal function EF 60-65%. Normal diastolic function. Mild LA dilation.  . DVT (deep venous thrombosis) ([facility])     Righ calf  . Insomnia   . Cystocele   . Neuropathy involving both lower extremities ([place])   . Anxiety and depression   . Obesity, Class II, BMI 35-39.9, with comorbidity ([place])   . Kidney stone   . Adrenal mass ([place])   . Arthritis   . Peripheral vascular disease ([place])   . Sleep apnea     use C-PAP  . Thromboembolic deterrent (TED) stockings in place on both lower extremities   . Bronchitis   . Anxiety   . History of IBS     PAST SURGICAL HISTORY: Past Surgical History  Procedure Laterality Date  . Hernia repair    . Ankle surgery Right   . Vaginal hysterectomy    . Sleeve gastroplasty    . Lithotripsy    . Kidney surgery    . Transthoracic echocardiogram  08/03/2013    Mild-Moderate concentric LVH, EF 60-65%. Normal diastolic function. Mild LA dilation. Mild MR with possible vegetation  - not confirmed on TEE   . Transesophageal echocardiogram  08/08/2013    Mild LVH, EF 60-65%. Moderate LA dilation and mild RA dilation. Mild MR with no evidence of stenosis and no evidence of endocarditis. A false chordae is noted.  . Tonsillectomy    . Total knee arthroplasty    . Hiatal hernia repair    . Joint replacement      left knee replacement  .  Cholecystectomy    . I&d extremity Left 06/27/2015    Procedure: IRRIGATION AND DEBRIDEMENT EXTREMITY            ( CALF HEMATOMA ) POSSIBLE WOUND VAC;  Surgeon: [name], MD;  Location: [facility] ORS;  Service: Vascular;  Laterality: Left;  . Application of wound vac Left 06/27/2015    Procedure: APPLICATION OF WOUND VAC ( POSSIBLE ) ;  Surgeon: [name], MD;  Location: [facility] ORS;  Service: Vascular;  Laterality: Left;  . Removal of left hematoma    . Nm gated myoview ([facility] hx)  February 2017    Likely breast attenuation. LOW RISK study. Normal EF 55-60%.  . Reverse shoulder arthroplasty Right 10/08/2015     Procedure: REVERSE SHOULDER ARTHROPLASTY;  Surgeon: [name], MD;  Location: [facility] ORS;  Service: Orthopedics;  Laterality: Right;    SOCIAL HISTORY:  Social History  Substance Use Topics  . Smoking status: Never Smoker   . Smokeless tobacco: Never Used  . Alcohol Use: No    FAMILY HISTORY:  Family History  Problem Relation Age of Onset  . Skin cancer Father   . Diabetes Father   . Hypertension Father   . Peripheral vascular disease Father   . Cancer Father   . Cerebral aneurysm Father   . Alcohol abuse Father   . Varicose Veins Mother   . Kidney disease Mother   . Arthritis Mother   . Mental illness Sister   . Cancer Maternal Aunt     breast  . Breast cancer Maternal Aunt   . Arthritis Maternal Grandmother   . Hypertension Maternal Grandmother   . Diabetes Maternal Grandmother   . Arthritis Maternal Grandfather   . Heart disease Maternal Grandfather   . Hypertension Maternal Grandfather   . Arthritis Paternal Grandmother   . Hypertension Paternal Grandmother   . Diabetes Paternal Grandmother   . Arthritis Paternal Grandfather   . Hypertension Paternal Grandfather     DRUG ALLERGIES:  Allergies  Allergen Reactions  . Aspirin Hives  . Penicillins Hives  . Terramycin [Oxytetracycline] Hives    REVIEW OF SYSTEMS:   CONSTITUTIONAL: No fever, fatigue or weakness.  EYES: No blurred or double vision.  EARS, NOSE, AND THROAT: No tinnitus or ear pain.  RESPIRATORY: No cough, Positive shortness of breath, no wheezing or hemoptysis.  CARDIOVASCULAR: No chest pain, orthopnea, edema.  GASTROINTESTINAL: No nausea, vomiting, diarrhea or abdominal pain.  GENITOURINARY: No dysuria, hematuria.  ENDOCRINE: No polyuria, nocturia,  HEMATOLOGY: No anemia, easy bruising or bleeding SKIN: No rash or lesion. MUSCULOSKELETAL: No joint pain or arthritis. Has sling in place  NEUROLOGIC: No tingling, numbness, weakness.  PSYCHIATRY: No anxiety or depression.   MEDICATIONS AT  HOME:  Prior to Admission medications   Medication Sig Start Date End Date Taking? Authorizing Provider  albuterol (PROVENTIL HFA;VENTOLIN HFA) 108 (90 Base) MCG/ACT inhaler Inhale 1-2 puffs into the lungs every 4 (four) hours as needed for wheezing or shortness of breath. 03/29/15  Yes Bobetta Lime, MD  albuterol (PROVENTIL) (2.5 MG/3ML) 0.083% nebulizer solution Take 3 mLs (2.5 mg total) by nebulization every 6 (six) hours as needed for wheezing or shortness of breath. 02/21/15  Yes Ashok Norris, MD  ALPRAZolam Duanne Moron) 0.5 MG tablet TAKE ONE TABLET TWICE DAILY AS NEEDED 09/20/15  Yes [name], DO  buPROPion (WELLBUTRIN XL) 300 MG 24 hr tablet TAKE ONE TABLET BY MOUTH EVERY DAY 03/29/15  Yes Bobetta Lime, MD  dabigatran (  PRADAXA) 150 MG CAPS capsule Take 150 mg by mouth 2 (two) times daily.   Yes Historical Provider, MD  DULoxetine (CYMBALTA) 60 MG capsule Take 1 capsule (60 mg total) by mouth daily. 09/17/15  Yes Milinda Pointer, MD  gabapentin (NEURONTIN) 800 MG tablet Take 1 tablet (800 mg total) by mouth every 6 (six) hours. 09/17/15  Yes Milinda Pointer, MD  HYDROcodone-acetaminophen (NORCO/VICODIN) 5-325 MG tablet Take 1 tablet by mouth every 8 (eight) hours as needed for severe pain. 09/17/15  Yes Milinda Pointer, MD  levothyroxine (SYNTHROID, LEVOTHROID) 150 MCG tablet Take 1 tablet (150 mcg total) by mouth every morning. 07/20/15  Yes Arnetha Courser, MD  Melatonin 10 MG CAPS Take 20 mg by mouth at bedtime as needed. 09/17/15  Yes Milinda Pointer, MD  metoprolol tartrate (LOPRESSOR) 25 MG tablet Take 1 tablet (25 mg total) by mouth 2 (two) times daily. 07/11/15  Yes [name], MD  mirabegron ER (MYRBETRIQ) 25 MG TB24 tablet Take 1 tablet (25 mg total) by mouth daily. 11/09/14  Yes Shannon A McGowan, PA-C  montelukast (SINGULAIR) 10 MG tablet Take 1 tablet (10 mg total) by mouth daily. Patient taking differently: Take 10 mg by mouth at bedtime.  07/20/15  Yes Arnetha Courser, MD   pantoprazole (PROTONIX) 40 MG tablet TAKE 1 TABLET BY MOUTH TWO TIMES DAILY IF NEEDED. CAUTION: PROLONGED USE MAY INCREASE RISK OF PNEUMONIA, COLITIS, OSTEOPOROSIS, ANEMIA 09/25/15  Yes Jayce G Cook, DO  quinapril (ACCUPRIL) 20 MG tablet Take 1 tablet (20 mg total) by mouth daily. 08/26/15  Yes Arnetha Courser, MD  ESTRACE VAGINAL 0.1 MG/GM vaginal cream INSERT 1 GRAM VAGINALLY TWICE A WEEK 09/18/15   [name], DO  flecainide (TAMBOCOR) 100 MG tablet Take 2 tablets (200 mg total) by mouth as needed (for afib. May take additional 100mg  flecainide six hours later if symptoms persist.). 06/26/15   [name], MD  metFORMIN (GLUCOPHAGE) 1000 MG tablet One pill by mouth every morning about 15 minutes before morning meal, and two pills 15 minutes before evening meal Patient taking differently: Take 1,000 mg by mouth daily with breakfast. 500 mg Metformin daily  before supper 07/30/15   Arnetha Courser, MD      PHYSICAL EXAMINATION:   VITAL SIGNS: Blood pressure 157/91, pulse 70, temperature 97.8 F (36.6 C), temperature source Oral, resp. rate 20, height 5\' 5"  (1.651 m), weight 127.007 kg (280 lb), SpO2 96 %.  GENERAL:  65 y.o.-year-old patient lying in the bed with no acute distress.  EYES: Pupils equal, round, reactive to light and accommodation. No scleral icterus. Extraocular muscles intact.  HEENT: Head atraumatic, normocephalic. Oropharynx and nasopharynx clear.  NECK:  Supple, no jugular venous distention. No thyroid enlargement, no tenderness.  LUNGS: Decreased breath sounds bilaterally, no wheezing, rales,rhonchi or crepitation. No use of accessory muscles of respiration.  CARDIOVASCULAR: S1, S2 normal. No murmurs, rubs, or gallops.  ABDOMEN: Soft, nontender, nondistended. Bowel sounds present. No organomegaly or mass.  EXTREMITIES: No pedal edema, cyanosis, or clubbing.  NEUROLOGIC: Cranial nerves II through XII are intact. Muscle strength 5/5 in all extremities. Sensation intact. Gait not  checked.  PSYCHIATRIC: The patient is alert and oriented x 3.  SKIN: No obvious rash, lesion, or ulcer.   LABORATORY PANEL:   CBC  Recent Labs Lab 10/09/15 0607  WBC 11.3*  HGB 10.6*  HCT 32.0*  PLT 234  MCV 86.2  MCH 28.6  MCHC 33.2  RDW 16.2*  LYMPHSABS 0.4*  MONOABS 1.0*  EOSABS 0.0  BASOSABS 0.1   ------------------------------------------------------------------------------------------------------------------  Chemistries   Recent Labs Lab 10/09/15 0607 10/09/15 0857  NA 135  --   K 6.8* 5.5*  CL 105  --   CO2 25  --   GLUCOSE 126*  --   BUN 20  --   CREATININE 0.97  --   CALCIUM 8.2*  --    ------------------------------------------------------------------------------------------------------------------ estimated creatinine clearance is 76.6 mL/min (by C-G formula based on Cr of 0.97). ------------------------------------------------------------------------------------------------------------------ No results for input(s): TSH, T4TOTAL, T3FREE, THYROIDAB in the last 72 hours.  Invalid input(s): FREET3   Coagulation profile No results for input(s): INR, PROTIME in the last 168 hours. ------------------------------------------------------------------------------------------------------------------- No results for input(s): DDIMER in the last 72 hours. -------------------------------------------------------------------------------------------------------------------  Cardiac Enzymes No results for input(s): CKMB, TROPONINI, MYOGLOBIN in the last 168 hours.  Invalid input(s): CK ------------------------------------------------------------------------------------------------------------------ Invalid input(s): POCBNP  ---------------------------------------------------------------------------------------------------------------  Urinalysis    Component Value Date/Time   COLORURINE YELLOW* 10/08/2015 0759   APPEARANCEUR CLEAR* 10/08/2015 0759    APPEARANCEUR Clear 11/09/2014 1100   LABSPEC 1.013 10/08/2015 0759   PHURINE 6.0 10/08/2015 0759   GLUCOSEU NEGATIVE 10/08/2015 0759   HGBUR NEGATIVE 10/08/2015 0759   BILIRUBINUR NEGATIVE 10/08/2015 0759   BILIRUBINUR Negative 11/09/2014 1100   BILIRUBINUR NEGATIVE 09/21/2014 1613   KETONESUR NEGATIVE 10/08/2015 0759   PROTEINUR NEGATIVE 10/08/2015 0759   PROTEINUR Negative 11/09/2014 1100   PROTEINUR NEGATIVE 09/21/2014 1613   UROBILINOGEN 0.2 09/21/2014 1613   NITRITE NEGATIVE 10/08/2015 0759   NITRITE Negative 11/09/2014 1100   NITRITE NEGATIVE 09/21/2014 1613   LEUKOCYTESUR NEGATIVE 10/08/2015 0759   LEUKOCYTESUR Negative 11/09/2014 1100     RADIOLOGY: Dg Shoulder Right Port  10/08/2015  CLINICAL DATA:  Postoperative evaluation of the right shoulder. EXAM: PORTABLE RIGHT SHOULDER - 2+ VIEW COMPARISON:  Shoulder MRI 07/26/2015 FINDINGS: Two views of the right shoulder demonstrate postoperative changes compatible with right shoulder arthroplasty. Hardware appears intact. Overlying surgical staple line. Visualized right hemi thorax is unremarkable. IMPRESSION: Patient status post right shoulder arthroplasty. Electronically Signed   By: Lovey Newcomer M.D.   On: 10/08/2015 13:17    EKG: Orders placed or performed in visit on 09/11/15  . EKG 12-Lead    IMPRESSION AND PLAN: Patient is a morbidly obese 65 year old white female with shortness of breath  1. Shortness of breath likely exacerbation of her chronic dyspnea likely multifactorial At this time I agree with the CT scan of the chest that is been ordered we'll follow up on the results to rule out PE as well as other pathology I will stop her IV fluids Check a BNP level Continue nebulizers as currently being done there is no evidence of wheezing sound hold off on steroids for now Recommend incentive spirometry  2. Diabetes type 2 I will place her on sliding scale insulin continue metformin  3. Hyperkalemia potassium is  improved repeat BMP in the morning  4. Atrial fibrillation continue flecainide and  predexa  5. Sleep apnea continue CPAP at bedtime  6. CODE STATUS full   All the records are reviewed and case discussed with ED provider. Management plans discussed with the patient, family and they are in agreement.  CODE STATUS:    Code Status Orders        Start     Ordered   10/08/15 1207  Full code   Continuous     10/08/15 1206    Code Status History  Date Active Date Inactive Code Status Order ID Comments User Context   This patient has a current code status but no historical code status.    Advance Directive Documentation        Most Recent Value   Type of Advance Directive  Healthcare Power of Attorney, Living will   Pre-existing out of facility DNR order (yellow form or pink MOST form)     "MOST" Form in Place?         TOTAL TIME TAKING CARE OF THIS PATIENT:55 minutes.    Dustin Flock M.D on 10/09/2015 at 11:37 AM  Between 7am to 6pm - Pager - [phone]  After 6pm go to [email or web address] - password [email or web address] [facility]  Office  [phone]  CC: Primary care physician; [name], DO

## 2015-10-09 NOTE — Progress Notes (Signed)
Subjective: 1 Day Post-Op Procedure(s) (LRB): REVERSE SHOULDER ARTHROPLASTY (Right) Patient reports pain as mild.   Patient is well, but has had some minor complaints of wheezing and hyperkalemia Plan is to go Home after hospital stay, however PT recommending SNF at this time.  Negative for chest pain but positive for shortness of breath.  Pt has history of COPD, uses nebulizers, inhalers as well as CPAP at home. Fever: no Gastrointestinal:Negative for nausea and vomiting  Objective: Vital signs in last 24 hours: Temp:  [96.9 F (36.1 C)-98.6 F (37 C)] 97.9 F (36.6 C) (07/19 0436) Pulse Rate:  [52-89] 57 (07/19 0436) Resp:  [10-18] 16 (07/19 0436) BP: (135-176)/(48-85) 137/76 mmHg (07/19 0436) SpO2:  [92 %-100 %] 92 % (07/19 0436) Weight:  [127.007 kg (280 lb)] 127.007 kg (280 lb) (07/18 1248)  Intake/Output from previous day:  Intake/Output Summary (Last 24 hours) at 10/09/15 0752 Last data filed at 10/09/15 0641  Gross per 24 hour  Intake   1900 ml  Output   1850 ml  Net     50 ml    Intake/Output this shift:    Labs:  Recent Labs  10/09/15 0607  HGB 10.6*    Recent Labs  10/09/15 0607  WBC 11.3*  RBC 3.71*  HCT 32.0*  PLT PENDING    Recent Labs  10/09/15 0607  NA 135  K 6.8*  CL 105  CO2 25  BUN 20  CREATININE 0.97  GLUCOSE 126*  CALCIUM 8.2*   No results for input(s): LABPT, INR in the last 72 hours.   EXAM General - Patient is Alert, Appropriate and Oriented Extremity - ABD soft Sensation intact distally Incision: dressing C/D/I No cellulitis present  Dressing/Incision - clean, dry, no drainage Motor Function - intact, moving foot and toes well on exam.   Heart auscultation reveals no murmur or irregular heartbeat. Scattered wheezing throughout the lungs. Pt is intact to light touch in the distribution of the axillary nerve to the right upper extremity. Negative Homan's to bilateral lower extremities.  Past Medical History   Diagnosis Date  . Hypothyroidism   . Diabetes ([place])   . Venous stasis dermatitis of both lower extremities   . Kidney disease   . Depression   . Dysthymic disorder   . Paroxysmal atrial fibrillation ([place]) 2007    In June 2015, Cardiac Event Monitor: Mostly SR/sinus arrhythmia with PVCs that are frequent. Short bursts of A. fib lasting several minutes;; CHA2DS2-VASc Score and unadjusted Ischemic Stroke Rate (% per year) is equal to 4.8 % stroke rate/year from a score of 4 (1 pt each for age, female, diabetes, hypertension)  . Asthma   . GERD (gastroesophageal reflux disease)   . Heart murmur     Echocardiogram June 2015: Mild MR (possible vegetation seen on TTE, not seen on TEE), normal LV size with moderate concentric LVH. Normal function EF 60-65%. Normal diastolic function. Mild LA dilation.  . DVT (deep venous thrombosis) (HCC)     Righ calf  . Insomnia   . Cystocele   . Neuropathy involving both lower extremities ([place])   . Anxiety and depression   . Obesity, Class II, BMI 35-39.9, with comorbidity ([place])   . Kidney stone   . Adrenal mass ([place])   . Arthritis   . Peripheral vascular disease ([place])   . Sleep apnea     use C-PAP  . Thromboembolic deterrent (TED) stockings in place on both lower extremities   . Bronchitis   .  Anxiety   . History of IBS    Assessment/Plan: 1 Day Post-Op Procedure(s) (LRB): REVERSE SHOULDER ARTHROPLASTY (Right) Active Problems:   Status post reverse total shoulder replacement  Estimated body mass index is 46.59 kg/(m^2) as calculated from the following:   Height as of this encounter: 5\' 5"  (1.651 m).   Weight as of this encounter: 127.007 kg (280 lb). Advance diet Up with therapy   K+ 6.8 this morning.  Order for repeat potassium placed, IVF switched to 0.9% sodium chloride without K+ Pt wheezing this morning, denies severe chest pain.  Encouraged incentive spirometer.  Will order for nebulizer treatment, same as nebulizer treatments she  receives at home.  If continued wheezing will obtain chest x-ray. Up with PT today, PT currently recommending SNF, pt would like to go home with husband. Plan will be for continued monitoring today due to HyperK and respiratory complaints.  Depending on condition will plan for discharge tomorrow.  DVT Prophylaxis - TED hose and Pradaxa Non-weightbearing to the right upper extremity  J. Cameron Proud, PA-C [facility] 10/09/2015, 7:52 AM

## 2015-10-09 NOTE — Clinical Social Work Note (Signed)
Clinical Social Work Assessment  Patient Details  Name: [name] MRN: [id number] Date of Birth: 04/14/1950  Date of referral:  10/09/15               Reason for consult:  Facility Placement                Permission sought to share information with:  Facility Contact Representative Permission granted to share information::  Yes, Verbal Permission Granted  Name::      Skilled Nursing Facility   Agency::   [place]   Relationship::     Contact Information:     Housing/Transportation Living arrangements for the past 2 months:  Single Family Home Source of Information:  Patient, Spouse Patient Interpreter Needed:  None Criminal Activity/Legal Involvement Pertinent to Current Situation/Hospitalization:  No - Comment as needed Significant Relationships:  Spouse Lives with:  Spouse Do you feel safe going back to the place where you live?  Yes Need for family participation in patient care:  Yes (Comment)  Care giving concerns:  Patient lives in [place] with her husband Jeffrey.     Social Worker assessment / plan:  Clinical Social Worker (CSW) received SNF consult. PT is recommending SNF. CSW met with patient and her husband was at bedside. Patient was alert and oriented and was sitting up in the chair. CSW introduced self and explained role of CSW department. Per patient she lives with her husband and has been [facility]. Patient reported that she has been having a rough couple of months with wounds on her legs that won't heal. Per patient Dr. [name] put a wound vac on her leg at one point. Patient does not have a wound vac now. CSW explained that PT is recommending SNF. Patient is agreeable to SNF search in [place]. CSW explained that patient's [employer] insurance will have to approve SNF stay. Patient verbalized her understanding.   FL2 complete and faxed out. CSW presented bed offers. Patient chose [facility]. Kim admissions coordinator at  [facility] is aware of above. CSW left Health Team case manager a voicemail making her aware of above. CSW will continue to follow and assist as needed.   Employment status:  Disabled (Comment on whether or not currently receiving Disability), Retired Insurance information:  Managed Medicare PT Recommendations:  Skilled Nursing Facility Information / Referral to community resources:  Skilled Nursing Facility  Patient/Family's Response to care:  Patient and her husband are agreeable for patient to go to [facility].   Patient/Family's Understanding of and Emotional Response to Diagnosis, Current Treatment, and Prognosis:  Patient and husband were pleasant and thanked CSW for visit.   Emotional Assessment Appearance:  Appears stated age Attitude/Demeanor/Rapport:    Affect (typically observed):  Accepting, Adaptable, Pleasant Orientation:  Oriented to Self, Oriented to Place, Oriented to  Time, Oriented to Situation Alcohol / Substance use:  Not Applicable Psych involvement (Current and /or in the community):  No (Comment)  Discharge Needs  Concerns to be addressed:  Discharge Planning Concerns Readmission within the last 30 days:  No Current discharge risk:  Dependent with Mobility Barriers to Discharge:  Continued Medical Work up   ,  G, LCSW 10/09/2015, 5:11 PM  

## 2015-10-09 NOTE — Progress Notes (Signed)
OT Cancellation Note  Patient Details Name: [name] MRN: [id number] DOB: [birth year]   Cancelled Treatment:    Reason Eval/Treat Not Completed: Medical issues which prohibited therapy Pt with elevated potassium of 5.5 which is contraindicated for therapy.  CT of chest indicated no PE but differential considerations include atelectasis versus pneumonia including aspiration pneumonia.  Will monitor potassium level and provide evaluation when in normal range.  Chrys Racer, OTR/L ascom [phone] 10/09/2015, 1:06 PM

## 2015-10-10 ENCOUNTER — Encounter
Admission: RE | Admit: 2015-10-10 | Discharge: 2015-10-10 | Disposition: A | Discharge status: Home / Self Care | Payer: PPO | Source: Ambulatory Visit | Attending: Internal Medicine | Admitting: Internal Medicine

## 2015-10-10 DIAGNOSIS — J449 Chronic obstructive pulmonary disease, unspecified: Secondary | ICD-10-CM | POA: Diagnosis not present

## 2015-10-10 DIAGNOSIS — J189 Pneumonia, unspecified organism: Secondary | ICD-10-CM | POA: Diagnosis not present

## 2015-10-10 DIAGNOSIS — G4730 Sleep apnea, unspecified: Secondary | ICD-10-CM | POA: Diagnosis not present

## 2015-10-10 DIAGNOSIS — R300 Dysuria: Secondary | ICD-10-CM | POA: Insufficient documentation

## 2015-10-10 DIAGNOSIS — E119 Type 2 diabetes mellitus without complications: Secondary | ICD-10-CM | POA: Diagnosis not present

## 2015-10-10 LAB — TYPE AND SCREEN
ABO/RH(D): AB POS
ANTIBODY SCREEN: POSITIVE
UNIT DIVISION: 0
Unit division: 0

## 2015-10-10 LAB — GLUCOSE, CAPILLARY
GLUCOSE-CAPILLARY: 134 mg/dL — AB (ref 65–99)
Glucose-Capillary: 119 mg/dL — ABNORMAL HIGH (ref 65–99)
Glucose-Capillary: 144 mg/dL — ABNORMAL HIGH (ref 65–99)
Glucose-Capillary: 128 mg/dL — ABNORMAL HIGH (ref 65–99)

## 2015-10-10 LAB — BASIC METABOLIC PANEL
ANION GAP: 6 (ref 5–15)
BUN: 20 mg/dL (ref 6–20)
CHLORIDE: 104 mmol/L (ref 101–111)
CO2: 26 mmol/L (ref 22–32)
CREATININE: 0.84 mg/dL (ref 0.44–1.00)
Calcium: 8.5 mg/dL — ABNORMAL LOW (ref 8.9–10.3)
GFR calc non Af Amer: >60 mL/min (ref >60)
Glucose, Bld: 130 mg/dL — ABNORMAL HIGH (ref 65–99)
Potassium: 5.5 mmol/L — ABNORMAL HIGH (ref 3.5–5.1)
SODIUM: 136 mmol/L (ref 135–145)

## 2015-10-10 MED ORDER — AMLODIPINE BESYLATE 5 MG PO TABS
5.0000 mg | ORAL_TABLET | Freq: Every day | ORAL | Status: DC
  Administered 2015-10-10 – 2015-10-11 (×2): 5 mg via ORAL
  Filled 2015-10-10: qty 1

## 2015-10-10 MED ORDER — SODIUM POLYSTYRENE SULFONATE 15 GM/60ML PO SUSP
15.0000 g | Freq: Once | ORAL | Status: AC
  Administered 2015-10-10: 15 g via ORAL
  Filled 2015-10-10: qty 60

## 2015-10-10 MED ORDER — LISINOPRIL 20 MG PO TABS: 20.0000 mg | ORAL_TABLET | Freq: Once | ORAL | Status: DC

## 2015-10-10 MED ORDER — OXYCODONE HCL 5 MG PO TABS: 5.0000 mg | ORAL_TABLET | ORAL | Status: DC | PRN

## 2015-10-10 MED ORDER — LISINOPRIL 20 MG PO TABS: 40.0000 mg | ORAL_TABLET | Freq: Every day | ORAL | Status: DC

## 2015-10-10 MED ORDER — LEVOTHYROXINE SODIUM 150 MCG PO TABS
150.0000 ug | ORAL_TABLET | Freq: Every day | ORAL | Status: DC
  Administered 2015-10-11 – 2015-10-13 (×3): 150 ug via ORAL
  Filled 2015-10-10 (×3): qty 1

## 2015-10-10 MED ORDER — LEVOFLOXACIN 500 MG PO TABS: 500.0000 mg | ORAL_TABLET | Freq: Every day | ORAL | Status: DC

## 2015-10-10 MED ORDER — LISINOPRIL 20 MG PO TABS
20.0000 mg | ORAL_TABLET | Freq: Every day | ORAL | Status: DC
  Administered 2015-10-11 – 2015-10-13 (×3): 20 mg via ORAL
  Filled 2015-10-10 (×3): qty 1

## 2015-10-10 NOTE — Progress Notes (Signed)
Per MD patient will likely be stable for D/C tomorrow. Plan is for patient to D/C to [facility]. Kim admissions coordinator at [facility] is aware of above. CSW left Health Team case manager a voicemail making her aware of above. CSW will continue to follow and assist as needed.   [employer], LCSW [phone]

## 2015-10-10 NOTE — Discharge Summary (Addendum)
Physician Discharge Summary  Patient ID: MAHNOOR FITZNER MRN: [id number] DOB/AGE: June 11, 1950 65 y.o.  Admit date: 10/08/2015 Discharge date: 10/11/15  Admission Diagnoses:  primary osteoarthritis Impingement/tendinopathy with cuff arthropathy of the right shoulder  Discharge Diagnoses: Patient Active Problem List   Diagnosis Date Noted  . Status post reverse total shoulder replacement 10/08/2015  . Rotator cuff syndrome (Right) 09/23/2015  . Rotator cuff partial tear (Right) 09/23/2015  . 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 Rotator cuff Tendinopathy (Right) 09/23/2015  . Osteoarthritis of shoulder (Right) 09/23/2015  . Long term prescription opiate use 09/23/2015  . Morbid obesity with BMI of 40.0-44.9, adult ([place]) 09/23/2015  . Chronic knee pain (Right) 09/23/2015  . Osteoarthritis of knee (Right) 09/23/2015  . Chronic prescription benzodiazepine use 09/23/2015  . Neurogenic pain 09/23/2015  . Joint pain 09/23/2015  . Arthralgia 09/23/2015  . Musculoskeletal pain 09/23/2015  . Osteoarthritis, multiple sites 09/23/2015  . Preoperative cardiovascular examination 09/11/2015  . Encounter for preventative adult health care exam with abnormal findings 08/29/2015  . Shoulder Glenoid labral tear (Right) 08/28/2015  . Other shoulder lesions (Right) 08/28/2015  . Chronic anticoagulation  04/24/2015  . Insomnia secondary to chronic pain 04/17/2015  . Chronic pain 01/16/2015  . Long term current use of opiate analgesic 01/16/2015  . Opiate use (15 MME/Day) 01/16/2015  . Encounter for therapeutic drug level monitoring 01/16/2015  . Lateral femoral cutaneous entrapment syndrome (Right) 01/16/2015  . Chronic shoulder pain (Right) 01/16/2015  . Mixed incontinence 11/26/2014  . Atrophic vaginitis 11/26/2014  . Myelolipoma of right adrenal gland 11/26/2014  . Major depression, recurrent, chronic ([place]) 10/24/2014  . Anxiety and depression 09/21/2014  .  Allergic contact dermatitis 09/21/2014  . Bladder cystocele 09/21/2014  . Gastro-esophageal reflux disease without esophagitis 09/21/2014  . H/O deep venous thrombosis 09/21/2014  . Extreme obesity ([place]) 09/21/2014  . Hernia, rectovaginal 09/21/2014  . DM (diabetes mellitus) type II controlled peripheral vascular disorder ([place]) 08/31/2014  . Diabetic peripheral neuropathy associated with type 2 diabetes mellitus ([place]) 08/31/2014  . Asthma, moderate persistent, well-controlled 08/31/2014  . Hypothyroidism, adult 08/31/2014  . Peripheral vascular disease due to secondary diabetes mellitus ([place]) 12/11/2013  . Apnea, sleep 10/19/2013  . Paroxysmal atrial fibrillation ([place]) 07/27/2013  . Hypertension goal BP (blood pressure) < 150/90 07/27/2013  Impingement/tendinopathy with cuff arthropathy of the right shoulder    Past Medical History  Diagnosis Date  . Hypothyroidism   . Diabetes ([place])   . Venous stasis dermatitis of both lower extremities   . Kidney disease   . Depression   . Dysthymic disorder   . Paroxysmal atrial fibrillation ([place]) 2007    In June 2015, Cardiac Event Monitor: Mostly SR/sinus arrhythmia with PVCs that are frequent. Short bursts of A. fib lasting several minutes;; CHA2DS2-VASc Score and unadjusted Ischemic Stroke Rate (% per year) is equal to 4.8 % stroke rate/year from a score of 4 (1 pt each for age, female, diabetes, hypertension)  . Asthma   . GERD (gastroesophageal reflux disease)   . Heart murmur     Echocardiogram June 2015: Mild MR (possible vegetation seen on TTE, not seen on TEE), normal LV size with moderate concentric LVH. Normal function EF 60-65%. Normal diastolic function. Mild LA dilation.  . DVT (deep venous thrombosis) (HCC)     Righ calf  . Insomnia   . Cystocele   . Neuropathy involving both lower extremities ([place])   . Anxiety and depression   . Obesity,  Class II, BMI 35-39.9, with comorbidity ([place])   . Kidney stone   . Adrenal mass ([place])    . Arthritis   . Peripheral vascular disease ([place])   . Sleep apnea     use C-PAP  . Thromboembolic deterrent (TED) stockings in place on both lower extremities   . Bronchitis   . Anxiety   . History of IBS     Transfusion: None   Consultants (if any): Treatment Team:  Dustin Flock, MD . Dr. Patrick Jupiter  Discharged Condition: Improved  Hospital Course: ROCKY VOGELSONG is an 65 y.o. female who was admitted 10/08/2015 with a diagnosis of Impingement/tendinopathy with cuff arthropathy of the right shoulder and went to the operating room on 10/08/2015 and underwent the above named procedures.  Medical consult was obtained 2/2 wheezing and a-fib by Dr. Ninfa Linden. Pt was also noted to be positive for blood. Dr. Patrick Jupiter evaluated pt. NSR. pradaxa was d/c 2/2 blood in stool. hgb has come back up and pradaxa was restarted. Pt denied any cp or sob.   Surgeries: Procedure(s): REVERSE SHOULDER ARTHROPLASTY on 10/08/2015 Patient tolerated the surgery well. Taken to PACU where she was stabilized and then transferred to the orthopedic floor.  Patient was continued on her Pradaxa 150mg  twice daily. Foot pumps applied bilaterally at 80 mm. Heels elevated on bed with rolled towels. No evidence of DVT. Negative Homan. Physical therapy started on day #1 for gait training and transfer. OT started day #1 for ADL and assisted devices.  Patient's Foley was removed on POD1  Post-op course was complicated by Hyperkalemia, first measurement 6.8, repeat measurement was 5.5, given Kayexalate for hyperkalemia  Pt had increased SOB while hospitalized, Internal Med consult placed, CT of chest negative for PE, did demonstrate possible pneumonia.  Started on Levaquin   Implants: All press-fit [employer] Comprehensive system with a #8 mini-humeral stem, a 44 mm humeral tray with a standard insert, and a mini-base plate with a 36 mm glenosphere.  She was given perioperative antibiotics:  Anti-infectives    Start      Dose/Rate Route Frequency Ordered Stop   10/10/15 0000  levofloxacin (LEVAQUIN) 500 MG tablet     500 mg Oral Daily 10/10/15 1207     10/09/15 1830  levofloxacin (LEVAQUIN) IVPB 500 mg     500 mg 100 mL/hr over 60 Minutes Intravenous Every 24 hours 10/09/15 1817     10/08/15 1900  vancomycin (VANCOCIN) IVPB 1000 mg/200 mL premix     1,000 mg 200 mL/hr over 60 Minutes Intravenous Every 12 hours 10/08/15 1206 10/08/15 2021   10/08/15 0650  vancomycin (VANCOCIN) 1-5 GM/200ML-% IVPB    Comments:  Rexanne Mano: cabinet override      10/08/15 0650 10/08/15 1859   10/08/15 0215  vancomycin (VANCOCIN) IVPB 1000 mg/200 mL premix     1,000 mg 200 mL/hr over 60 Minutes Intravenous  Once 10/08/15 0206 10/08/15 0755    .  She was given sequential compression devices, early ambulation, and Pradaxa for DVT prophylaxis.  She benefited maximally from the hospital stay and there were no complications.    Recent vital signs:  Filed Vitals:   10/10/15 0412 10/10/15 0805  BP: 177/74 179/74  Pulse: 58 57  Temp: 97.5 F (36.4 C) 98.4 F (36.9 C)  Resp: 18 18    Recent laboratory studies:  Lab Results  Component Value Date   HGB 10.6* 10/09/2015   HGB 11.4* 09/26/2015   HGB 12.0 06/24/2015   Lab  Results  Component Value Date   WBC 11.3* 10/09/2015   PLT 234 10/09/2015   Lab Results  Component Value Date   INR 1.18 09/26/2015   Lab Results  Component Value Date   NA 136 10/10/2015   K 5.5* 10/10/2015   CL 104 10/10/2015   CO2 26 10/10/2015   BUN 20 10/10/2015   CREATININE 0.84 10/10/2015   GLUCOSE 130* 10/10/2015    Discharge Medications:     Medication List    STOP taking these medications        HYDROcodone-acetaminophen 5-325 MG tablet  Commonly known as:  NORCO/VICODIN      TAKE these medications        albuterol (2.5 MG/3ML) 0.083% nebulizer solution  Commonly known as:  PROVENTIL  Take 3 mLs (2.5 mg total) by nebulization every 6 (six) hours as needed for  wheezing or shortness of breath.     albuterol 108 (90 Base) MCG/ACT inhaler  Commonly known as:  PROVENTIL HFA;VENTOLIN HFA  Inhale 1-2 puffs into the lungs every 4 (four) hours as needed for wheezing or shortness of breath.     ALPRAZolam 0.5 MG tablet  Commonly known as:  XANAX  TAKE ONE TABLET TWICE DAILY AS NEEDED     buPROPion 300 MG 24 hr tablet  Commonly known as:  WELLBUTRIN XL  TAKE ONE TABLET BY MOUTH EVERY DAY     dabigatran 150 MG Caps capsule  Commonly known as:  PRADAXA  Take 150 mg by mouth 2 (two) times daily.     DULoxetine 60 MG capsule  Commonly known as:  CYMBALTA  Take 1 capsule (60 mg total) by mouth daily.     ESTRACE VAGINAL 0.1 MG/GM vaginal cream  Generic drug:  estradiol  INSERT 1 GRAM VAGINALLY TWICE A WEEK     flecainide 100 MG tablet  Commonly known as:  TAMBOCOR  Take 2 tablets (200 mg total) by mouth as needed (for afib. May take additional 100mg  flecainide six hours later if symptoms persist.).     gabapentin 800 MG tablet  Commonly known as:  NEURONTIN  Take 1 tablet (800 mg total) by mouth every 6 (six) hours.     levofloxacin 500 MG tablet  Commonly known as:  LEVAQUIN  Take 1 tablet (500 mg total) by mouth daily.     levothyroxine 150 MCG tablet  Commonly known as:  SYNTHROID, LEVOTHROID  Take 1 tablet (150 mcg total) by mouth every morning.     Melatonin 10 MG Caps  Take 20 mg by mouth at bedtime as needed.     metFORMIN 1000 MG tablet  Commonly known as:  GLUCOPHAGE  One pill by mouth every morning about 15 minutes before morning meal, and two pills 15 minutes before evening meal     metoprolol tartrate 25 MG tablet  Commonly known as:  LOPRESSOR  Take 1 tablet (25 mg total) by mouth 2 (two) times daily.     mirabegron ER 25 MG Tb24 tablet  Commonly known as:  MYRBETRIQ  Take 1 tablet (25 mg total) by mouth daily.     montelukast 10 MG tablet  Commonly known as:  SINGULAIR  Take 1 tablet (10 mg total) by mouth daily.      oxyCODONE 5 MG immediate release tablet  Commonly known as:  Oxy IR/ROXICODONE  Take 1-2 tablets (5-10 mg total) by mouth every 4 (four) hours as needed for breakthrough pain.     pantoprazole 40 MG  tablet  Commonly known as:  PROTONIX  TAKE 1 TABLET BY MOUTH TWO TIMES DAILY IF NEEDED. CAUTION: PROLONGED USE MAY INCREASE RISK OF PNEUMONIA, COLITIS, OSTEOPOROSIS, ANEMIA     quinapril 20 MG tablet  Commonly known as:  ACCUPRIL  Take 1 tablet (20 mg total) by mouth daily.        Diagnostic Studies: Ct Angio Chest Pe W Or Wo Contrast  10/09/2015  CLINICAL DATA:  Recent right shoulder surgery.  Shortness of breath. EXAM: CT ANGIOGRAPHY CHEST WITH CONTRAST TECHNIQUE: Multidetector CT imaging of the chest was performed using the standard protocol during bolus administration of intravenous contrast. Multiplanar CT image reconstructions and MIPs were obtained to evaluate the vascular anatomy. CONTRAST:  100 mL Isovue 370 COMPARISON:  None. FINDINGS: Mediastinum/Lymph Nodes: No pulmonary emboli or thoracic aortic dissection identified. Normal heart size. No pericardial effusion. No masses or pathologically enlarged lymph nodes identified. Lungs/Pleura: No pleural effusion or pneumothorax. Left lower lobe airspace disease. Airspace disease in the posterior segment of the right upper lobe with small area of hyperdense material within the airspace disease. There is a small area of reticular nodular airspace disease in the left upper lobe which may be secondary to an infectious or inflammatory etiology. Elevation of the right diaphragm. Upper abdomen: Large bilateral fat containing adrenal masses most compatible with myelolipomas which are incompletely visualized. There is evidence of prior gastric bypass. There is a small hiatal hernia. Musculoskeletal: No chest wall mass or suspicious bone lesions identified. There is mild lower thoracic spine spondylosis. There is a chronic T12 vertebral body compression  fracture. Total reverse right shoulder arthroplasty. Review of the MIP images confirms the above findings. IMPRESSION: 1. No evidence pulmonary embolus. 2. Airspace disease in the posterior segment of the right upper lobe and right lower lobe with a small area of hyperdense material in the right upper lobe airspace disease. Differential considerations include atelectasis versus pneumonia including aspiration pneumonia. 3. Reticular nodular airspace disease in the left upper lobe which may be secondary to an infectious or inflammatory etiology. Electronically Signed   By: Kathreen Devoid   On: 10/09/2015 12:53   Dg Chest Port 1 View  10/09/2015  CLINICAL DATA:  COPD EXAM: PORTABLE CHEST 1 VIEW COMPARISON:  12/17/2014 FINDINGS: 1148 hours. Marked asymmetric elevation right hemidiaphragm. The cardio pericardial silhouette is enlarged. Vascular congestion without overt airspace pulmonary edema. There is some atelectasis in the right lower lung. Patient is status post right shoulder replacement. Telemetry leads overlie the chest. IMPRESSION: Marked asymmetric elevation right hemidiaphragm. Cardiomegaly. Electronically Signed   By: Misty Stanley M.D.   On: 10/09/2015 12:14   Dg Shoulder Right Port  10/08/2015  CLINICAL DATA:  Postoperative evaluation of the right shoulder. EXAM: PORTABLE RIGHT SHOULDER - 2+ VIEW COMPARISON:  Shoulder MRI 07/26/2015 FINDINGS: Two views of the right shoulder demonstrate postoperative changes compatible with right shoulder arthroplasty. Hardware appears intact. Overlying surgical staple line. Visualized right hemi thorax is unremarkable. IMPRESSION: Patient status post right shoulder arthroplasty. Electronically Signed   By: Lovey Newcomer M.D.   On: 10/08/2015 13:17   Disposition: Plan will be for discharge to [facility] tomorrow.  Pt will received two doses of 500mg  Levaquin, will prescribe 8 more doses for after discharge.  Pt will continue prn inhalers and nebulizer treatments.   Follow-up with PCP if continues SOB or worsening symptoms.  Pt will follow-up with [facility] in 10-14 days.      Follow-up Information    Follow up  with [facility] .   Specialty:  [place] information:   [address] [place] [phone]      Follow up with Judson Roch, PA-C In 10 days.   Specialty:  Physician Assistant   Why:  For suture removal, For wound re-check   Contact information:   [place] [address] [phone]       Follow up with [name], DO.   Specialty:  Family Medicine   Why:  For evaluation of shortness of breath if persistent.   Contact information:   [place] [address] [phone]      Signed: Judson Roch PA-C 10/10/2015, 12:09 PM

## 2015-10-10 NOTE — Progress Notes (Signed)
Metformin held per orders, hold for 48hrs after CT contrast.

## 2015-10-10 NOTE — Progress Notes (Signed)
Subjective: 2 Days Post-Op Procedure(s) (LRB): REVERSE SHOULDER ARTHROPLASTY (Right) Patient reports pain as mild.   Patient is well, but has had some minor complaints of wheezing and hyperkalemia Plan is to go to [facility] following discharge from the hospital. Negative for chest pain but positive for shortness of breath.  Pt has history of COPD, uses nebulizers, inhalers as well as CPAP at home. Fever: no Gastrointestinal:Negative for nausea and vomiting  Objective: Vital signs in last 24 hours: Temp:  [97.5 F (36.4 C)-98.4 F (36.9 C)] 98.4 F (36.9 C) (07/20 0805) Pulse Rate:  [57-79] 57 (07/20 0805) Resp:  [18-22] 18 (07/20 0805) BP: (170-179)/(63-77) 179/74 mmHg (07/20 0805) SpO2:  [95 %-100 %] 100 % (07/20 0805) FiO2 (%):  [28 %] 28 % (07/19 1702)  Intake/Output from previous day:  Intake/Output Summary (Last 24 hours) at 10/10/15 1215 Last data filed at 10/10/15 0900  Gross per 24 hour  Intake    820 ml  Output   1250 ml  Net   -430 ml    Intake/Output this shift: Total I/O In: 240 [P.O.:240] Out: 300 [Urine:300]  Labs:  Recent Labs  10/09/15 0607  HGB 10.6*    Recent Labs  10/09/15 0607  WBC 11.3*  RBC 3.71*  HCT 32.0*  PLT 234    Recent Labs  10/09/15 0607 10/09/15 0857 10/10/15 0414  NA 135  --  136  K 6.8* 5.5* 5.5*  CL 105  --  104  CO2 25  --  26  BUN 20  --  20  CREATININE 0.97  --  0.84  GLUCOSE 126*  --  130*  CALCIUM 8.2*  --  8.5*   No results for input(s): LABPT, INR in the last 72 hours.   EXAM General - Patient is Alert, Appropriate and Oriented Extremity - ABD soft Sensation intact distally Incision: dressing C/D/I No cellulitis present  Dressing/Incision - clean, dry, no drainage Motor Function - intact, moving foot and toes well on exam.   Heart auscultation reveals no murmur or irregular heartbeat. Scattered wheezing throughout the lungs. Pt is intact to light touch in the distribution of the axillary nerve to  the right upper extremity. Negative Homan's to bilateral lower extremities.  Past Medical History  Diagnosis Date  . Hypothyroidism   . Diabetes ([place])   . Venous stasis dermatitis of both lower extremities   . Kidney disease   . Depression   . Dysthymic disorder   . Paroxysmal atrial fibrillation ([place]) 2007    In June 2015, Cardiac Event Monitor: Mostly SR/sinus arrhythmia with PVCs that are frequent. Short bursts of A. fib lasting several minutes;; CHA2DS2-VASc Score and unadjusted Ischemic Stroke Rate (% per year) is equal to 4.8 % stroke rate/year from a score of 4 (1 pt each for age, female, diabetes, hypertension)  . Asthma   . GERD (gastroesophageal reflux disease)   . Heart murmur     Echocardiogram June 2015: Mild MR (possible vegetation seen on TTE, not seen on TEE), normal LV size with moderate concentric LVH. Normal function EF 60-65%. Normal diastolic function. Mild LA dilation.  . DVT (deep venous thrombosis) ([facility])     Righ calf  . Insomnia   . Cystocele   . Neuropathy involving both lower extremities ([place])   . Anxiety and depression   . Obesity, Class II, BMI 35-39.9, with comorbidity ([place])   . Kidney stone   . Adrenal mass ([place])   . Arthritis   . Peripheral  vascular disease ([place])   . Sleep apnea     use C-PAP  . Thromboembolic deterrent (TED) stockings in place on both lower extremities   . Bronchitis   . Anxiety   . History of IBS    Assessment/Plan: 2 Days Post-Op Procedure(s) (LRB): REVERSE SHOULDER ARTHROPLASTY (Right) Active Problems:   Status post reverse total shoulder replacement  Estimated body mass index is 46.59 kg/(m^2) as calculated from the following:   Height as of this encounter: 5\' 5"  (1.651 m).   Weight as of this encounter: 127.007 kg (280 lb). Advance diet Up with therapy   K+ 5.5 today.  Internal Med order Kayexalate, will recheck BMP tomorrow morning PT wheezing improved today, currently on 2L of O2, Chest CT negative for PE.   Possible pneumonia, started on Levaquin. Plan will be for discharge to [facility] tomorrow. [facility] input.  DVT Prophylaxis - TED hose and Pradaxa Non-weightbearing to the right upper extremity  J. Cameron Proud, PA-C [facility] 10/10/2015, 12:15 PM

## 2015-10-10 NOTE — Evaluation (Signed)
Occupational Therapy Evaluation Patient Details Name: [name] MRN: [id number] DOB: 06/15/1950 Today's Date: 10/10/2015    History of Present Illness Pt is a 65 yr old female s/p elective R reverse total shoulder replacement 7/18. PMH significant for HTN, DM, peripheral neuropathy, bilat venous stasis dermatitis, paroxysmal AFib, asthma, h/o DVTs, and chronic pain.    Clinical Impression    Patient was evaluated this date by OT, she lives with husband who is able to assist as needed. Patient was independent prior to admission including driving and was retired.Patient has orders for RUE immobilized with ice in place. Patient presents with decreased strength, decreased ability to perform self care tasks and will need further instructions for managing daily self care tasks with use of one hand only. Patient will benefit from skilled OT care to address these limitations and improve independence in daily tasks to return home with family. Rec SNF for continued rehab.    Follow Up Recommendations  SNF    Equipment Recommendations       Recommendations for Other Services       Precautions / Restrictions Precautions Precautions: Fall;Shoulder Type of Shoulder Precautions: Reverse total shoulder Precaution Comments: Shoulder immobilizer with abduction pillow worn at all times (Right) Restrictions Weight Bearing Restrictions: Yes RUE Weight Bearing: Non weight bearing      Mobility Bed Mobility                  Transfers                      Balance                                            ADL Overall ADL's : Needs assistance/impaired Eating/Feeding: Set up;Minimal assistance   Grooming: Wash/dry hands;Wash/dry face;Oral care;Applying deodorant;Brushing hair;Minimal assistance;Set up;Adhering to UE precautions             Upper Body Dressing Details (indicate cue type and reason): deferred since immoblilizer in  place---reviewd dressing tech and position of immobilizer with pt and husband                   General ADL Comments: Pt is limited due to immobilizer on RUE after shoulder surgery which is to be worn at all times.  Pt with SOB siting and adjusted immobilizer with improved comfort and less SOB.  Pt and husband instructed in dressing tech and how pto suppoort RUE during ADLs .       Vision     Perception     Praxis      Pertinent Vitals/Pain Pain Assessment: 0-10 Pain Score: 5  Pain Location: R shoulder Pain Descriptors / Indicators: Constant;Discomfort Pain Intervention(s): Limited activity within patient's tolerance;Monitored during session;Premedicated before session;Ice applied     Hand Dominance Right   Extremity/Trunk Assessment Upper Extremity Assessment Upper Extremity Assessment: RUE deficits/detail RUE Deficits / Details: R hand and wrist movements good while in immoblizer---exercises deferred to Pt   Lower Extremity Assessment Lower Extremity Assessment: Defer to PT evaluation       Communication Communication Communication: No difficulties   Cognition Arousal/Alertness: Awake/alert Behavior During Therapy: WFL for tasks assessed/performed Overall Cognitive Status: Within Functional Limits for tasks assessed                     General Comments  Exercises       Shoulder Instructions      Home Living Family/patient expects to be discharged to:: Private residence Living Arrangements: Spouse/significant other Available Help at Discharge: Family Type of Home: House Home Access: Stairs to enter Technical brewer of Steps: 4 Entrance Stairs-Rails: Right Home Layout: One level     Bathroom Shower/Tub: Tub/shower unit Shower/tub characteristics: Architectural technologist: Standard Bathroom Accessibility: Yes How Accessible: Accessible via walker Home Equipment: Gilford Rile - 2 wheels;Cane - single point;Bedside commode;Shower  seat;Hand held shower head          Prior Functioning/Environment Level of Independence: Independent;Independent with assistive device(s)        Comments: Using cane x 3-4 days d/t LE pain    OT Diagnosis: Acute pain;Other (comment) (RUE immobilized )   OT Problem List: Decreased strength;Decreased range of motion;Decreased activity tolerance;Cardiopulmonary status limiting activity;Pain   OT Treatment/Interventions: Self-care/ADL training;DME and/or AE instruction;Patient/family education;Therapeutic activities    OT Goals(Current goals can be found in the care plan section) Acute Rehab OT Goals Patient Stated Goal: To return to PLOF OT Goal Formulation: With patient/family Time For Goal Achievement: 10/24/15 Potential to Achieve Goals: Good ADL Goals Pt Will Perform Lower Body Dressing: with supervision;with caregiver independent in assisting;sit to/from stand;with adaptive equipment Pt Will Transfer to Toilet: with supervision;regular height toilet  OT Frequency: Min 1X/week   Barriers to D/C:            Co-evaluation              End of Session    Activity Tolerance: Patient limited by pain Patient left: in chair;with call bell/phone within reach;with chair alarm set;with family/visitor present   Time: 1115-1145 OT Time Calculation (min): 30 min Charges:  OT General Charges $OT Visit: 1 Procedure OT Evaluation $OT Eval Moderate Complexity: 1 Procedure OT Treatments $Self Care/Home Management : 8-22 mins G-Codes:     Chrys Racer, OTR/L ascom [phone]   Wofford,Susan 10/10/2015, 11:56 AM

## 2015-10-10 NOTE — Progress Notes (Signed)
Physical Therapy Treatment Patient Details Name: [name] MRN: [id number] DOB: December 05, 1950 Today's Date: 10/10/2015    History of Present Illness Pt is a 65 yr old female s/p elective R reverse total shoulder replacement 7/18. PMH significant for HTN, DM, peripheral neuropathy, bilat venous stasis dermatitis, paroxysmal AFib, asthma, h/o DVTs, and chronic pain.     PT Comments    Pt agreeable to PT. Pt continues with wheezing sounds and noted shortness of breath with activity. O2 sats 100% on 2 liters; ambulation of 25 feet on room air with desaturation to 87%. O2 replaced for session. Pt requires Min A for toileting , transfers and ambulation. Pt notes she is used to using cane in the right hand for ambulation, so using it in the left makes ambulation more unsteady. Further educated in hand, wrist and lower extremity exercises and encouraged use of incentive spirometer. Plan to see pt this afternoon for continued work on strength, endurance and balance for improved functional mobility.   Follow Up Recommendations        Equipment Recommendations       Recommendations for Other Services       Precautions / Restrictions Precautions Precautions: Fall;Shoulder Type of Shoulder Precautions: Reverse total shoulder Precaution Comments: Shoulder immobilizer with abduction pillow worn at all times (Right) Restrictions Weight Bearing Restrictions: Yes RUE Weight Bearing: Non weight bearing    Mobility  Bed Mobility               General bed mobility comments: Not tested; up in chair  Transfers Overall transfer level: Needs assistance Equipment used: Straight cane Transfers: Sit to/from Stand Sit to Stand: Min assist         General transfer comment: Increased time and effort with several push off attempts before attaining stand from both recliner and bedside commode.   Ambulation/Gait Ambulation/Gait assistance: Min assist Ambulation Distance (Feet): 25 Feet  (performed 2x) Assistive device: Straight cane Gait Pattern/deviations: Step-through pattern;Decreased step length - right;Decreased step length - left;Decreased stride length;Leaning posteriorly (partial step through; R/L sway; occasional post'r mild LOB) Gait velocity: Decreased Gait velocity interpretation: <1.8 ft/sec, indicative of risk for recurrent falls General Gait Details: Slow, effortful; lacks confidence. Mildly unsteady with occasional post'r tendencies of mild sudden LOB. First walk on room air with O2 desaturation to 87%. Recovers quickly on 2L to 96%    Stairs            Wheelchair Mobility    Modified Rankin (Stroke Patients Only)       Balance           Standing balance support: Single extremity supported Standing balance-Leahy Scale: Fair (Fair (-))                      Cognition Arousal/Alertness: Awake/alert Behavior During Therapy: WFL for tasks assessed/performed Overall Cognitive Status: Within Functional Limits for tasks assessed                      Exercises General Exercises - Lower Extremity Ankle Circles/Pumps: AROM;Both;20 reps (long sit) Quad Sets: Strengthening;Both;20 reps (long sit) Gluteal Sets: Strengthening;Both;20 reps (long sit) Long Arc Quad: AROM;Both;20 reps;Seated Heel Slides: AROM;Both;20 reps (long sit) Hip ABduction/ADduction: AROM;Both;20 reps;Seated Other Exercises Other Exercises: Reviewed R hand ball squeeze, wrist arom and hand arom Other Exercises: Set up and 1 person assist in stand for hygiene post toileting    General Comments        Pertinent  Vitals/Pain Pain Assessment: 0-10 Pain Score: 5  Pain Location: R shoulder/arm Pain Descriptors / Indicators: Aching;Other (Comment) (uncomfortable) Pain Intervention(s): Repositioned;Premedicated before session;Monitored during session    [place] expects to be discharged to:: Private residence Living Arrangements:  Spouse/significant other Available Help at Discharge: Family Type of Home: House Home Access: Stairs to enter Entrance Stairs-Rails: Right Home Layout: One level Home Equipment: Environmental consultant - 2 wheels;Cane - single point;Bedside commode;Shower seat;Hand held shower head      Prior Function Level of Independence: Independent;Independent with assistive device(s)      Comments: Using cane x 3-4 days d/t LE pain   PT Goals (current goals can now be found in the care plan section) Acute Rehab PT Goals Patient Stated Goal: To return to PLOF Progress towards PT goals: Progressing toward goals    Frequency  BID    PT Plan Current plan remains appropriate    Co-evaluation             End of Session Equipment Utilized During Treatment: Gait belt;Oxygen Activity Tolerance: Patient limited by fatigue;Other (comment) (SOB) Patient left: in chair;with call bell/phone within reach;with chair alarm set     Time: 1021-1100 PT Time Calculation (min) (ACUTE ONLY): 39 min  Charges:  $Gait Training: 8-22 mins $Therapeutic Exercise: 8-22 mins $Therapeutic Activity: 8-22 mins                    G Codes:      Charlaine Dalton, PTA 10/10/2015, 1:31 PM

## 2015-10-10 NOTE — Progress Notes (Signed)
Physical Therapy Treatment Patient Details Name: [name] MRN: [id number] DOB: Jul 15, 1950 Today's Date: 10/10/2015    History of Present Illness Pt is a 65 yr old female s/p elective R reverse total shoulder replacement 7/18. PMH significant for HTN, DM, peripheral neuropathy, bilat venous stasis dermatitis, paroxysmal AFib, asthma, h/o DVTs, and chronic pain.     PT Comments    Pt notes just returning to bed and having a breathing treatment, which has helped her to feel better. Pt lethargic and notes fatigue from being up in chair and days activities. Spouse has several questions regarding exercises. Pt/spoused re educated on supine bed exercises and encouraged performance as able throughout the day. Continue PT to progress strength, endurance and out of bed activities/ambulation to improve functional mobility with safe O2 saturation levels.   Follow Up Recommendations        Equipment Recommendations       Recommendations for Other Services       Precautions / Restrictions Precautions Precautions: Fall;Shoulder Type of Shoulder Precautions: Reverse total shoulder Precaution Comments: Shoulder immobilizer with abduction pillow worn at all times (Right) Restrictions Weight Bearing Restrictions: Yes RUE Weight Bearing: Non weight bearing    Mobility  Bed Mobility               General bed mobility comments: Not tested; just returned to bed   Transfers Overall transfer level: Needs assistance Equipment used: Straight cane Transfers: Sit to/from Stand Sit to Stand: Min assist         General transfer comment: Increased time and effort with several push off attempts before attaining stand from both recliner and bedside commode.   Ambulation/Gait Ambulation/Gait assistance: Min assist Ambulation Distance (Feet): 25 Feet (performed 2x) Assistive device: Straight cane Gait Pattern/deviations: Step-through pattern;Decreased step length - right;Decreased step  length - left;Decreased stride length;Leaning posteriorly (partial step through; R/L sway; occasional post'r mild LOB) Gait velocity: Decreased Gait velocity interpretation: <1.8 ft/sec, indicative of risk for recurrent falls General Gait Details: Slow, effortful; lacks confidence. Mildly unsteady with occasional post'r tendencies of mild sudden LOB. First walk on room air with O2 desaturation to 87%. Recovers quickly on 2L to 96%    Stairs            Wheelchair Mobility    Modified Rankin (Stroke Patients Only)       Balance           Standing balance support: Single extremity supported Standing balance-Leahy Scale: Fair (Fair (-))                      Cognition Arousal/Alertness: Lethargic Behavior During Therapy: Flat affect Overall Cognitive Status: Within Functional Limits for tasks assessed                      Exercises General Exercises - Lower Extremity Ankle Circles/Pumps: AROM;Both;20 reps (long sit) Quad Sets: Strengthening;Both;20 reps (long sit) Gluteal Sets: Strengthening;Both;20 reps (long sit) Long Arc Quad: AROM;Both;20 reps;Seated Heel Slides: AROM;Both;20 reps (long sit) Hip ABduction/ADduction: AROM;Both;20 reps;Seated Other Exercises Other Exercises: Reviewed supine exercises with pt/spouse and encouraged performance as able throughout the day Other Exercises: Set up and 1 person assist in stand for hygiene post toileting    General Comments        Pertinent Vitals/Pain Pain Assessment: 0-10 Pain Score:  (does not quantify, but improved from morning) Pain Location: R shoulder Pain Descriptors / Indicators: Aching;Other (Comment) (uncomfortable) Pain Intervention(s): Repositioned;Premedicated before  session;Monitored during session    Home Living                      Prior Function            PT Goals (current goals can now be found in the care plan section) Progress towards PT goals: Progressing toward  goals    Frequency  BID    PT Plan Current plan remains appropriate    Co-evaluation             End of Session Equipment Utilized During Treatment: Gait belt;Oxygen Activity Tolerance: Patient limited by fatigue;Other (comment) (SOB) Patient left: in chair;with call bell/phone within reach;with chair alarm set     Time: [id number] PT Time Calculation (min) (ACUTE ONLY): 9 min  Charges:  $Gait Training: 8-22 mins $Therapeutic Exercise: 8-22 mins $Therapeutic Activity: 8-22 mins                    G Codes:      Charlaine Dalton 10/10/2015, 5:09 PM

## 2015-10-10 NOTE — Discharge Instructions (Signed)
Shoulder Joint Replacement, Care After Refer to this sheet in the next few weeks. These instructions provide you with information on caring for yourself after your procedure. Your health care provider may also give you more specific instructions. Your treatment has been planned according to current medical practices, but problems sometimes occur. Call your health care provider if you have any problems or questions after your procedure. WHAT TO EXPECT AFTER THE PROCEDURE After your procedure, your arm and shoulder will typically be stiff and bruised. This will improve over time. HOME CARE INSTRUCTIONS   You may resume your normal diet and activities as directed by your surgeon.  You should regain full use of your shoulder in 6 weeks.  Your arm will be in a sling. You will need to wear this for 4-6 weeks after surgery.  Wear the sling every night for at least the first month, or as instructed by your surgeon.  Do not use your arm to push yourself up in bed or from a chair. This requires too much muscle.  Follow the program of home exercises suggested. Do the exercises 4-5 times a day for a month or as directed.  Try not to overuse your shoulder. Overusing the shoulder is easy to do if this is the first time you have been pain free in a long time. Early overuse of the shoulder may result in later problems.  Do not lift anything heavier than a cup of coffee for the first 6 weeks after surgery.  Ask for help. Your health care provider may be able to suggest a clinic or agency for this if you do not have home support.  Do not participate in contact sports or do any heavy lifting (more than 10 lb [4.5 kg]) for at least 6 months, or as directed.  Apply ice to the injured area for the first 2 days after surgery:  Put ice in a plastic bag.  Place a towel between your skin and the bag.  Leave the ice on for 20 minutes, 2-3 times a day.  Change dressings if necessary or as directed.  Only  take over-the-counter or prescription medicines for pain, discomfort, or fever as directed by your health care provider.  Keep all follow-up appointments as directed. SEEK MEDICAL CARE IF:  You have redness, swelling, or increasing pain in the wound.  You see pus coming from the wound.  You have a fever.  You notice a bad smell coming from the wound or dressing.  The edges of the wound break open after sutures or staples have been removed.  You have increasing pain with movement of the shoulder. SEEK IMMEDIATE MEDICAL CARE IF:   You develop a rash.  You have chest pain or shortness of breath.  You have any reaction or side effects to medicine given. MAKE SURE YOU:  Understand these instructions.  Will watch your condition.  Will get help right away if you are not doing well or get worse.   This information is not intended to replace advice given to you by your health care provider. Make sure you discuss any questions you have with your health care provider.   Document Released: 09/26/2004 Document Revised: 03/14/2013 Document Reviewed: 10/06/2012 Elsevier Interactive Patient Education [employer].

## 2015-10-10 NOTE — Progress Notes (Addendum)
[facility] [place] NAME: [name]    MR#:  [id number]  DATE OF BIRTH:  [birth year]  SUBJECTIVE:  CHIEF COMPLAINT:  Patient is resting comfortably with it. Pain is manageable  REVIEW OF SYSTEMS:  CONSTITUTIONAL: No fever, fatigue or weakness.  EYES: No blurred or double vision.  EARS, NOSE, AND THROAT: No tinnitus or ear pain.  RESPIRATORY: No cough, shortness of breath, wheezing or hemoptysis.  CARDIOVASCULAR: No chest pain, orthopnea, edema.  GASTROINTESTINAL: No nausea, vomiting, diarrhea or abdominal pain.  GENITOURINARY: No dysuria, hematuria.  ENDOCRINE: No polyuria, nocturia,  HEMATOLOGY: No anemia, easy bruising or bleeding SKIN: No rash or lesion. MUSCULOSKELETAL: Left shoulder pain is manageable NEUROLOGIC: No tingling, numbness, weakness.  PSYCHIATRY: No anxiety or depression.   DRUG ALLERGIES:   Allergies  Allergen Reactions  . Aspirin Hives  . Penicillins Hives  . Terramycin [Oxytetracycline] Hives    VITALS:  Blood pressure 168/68, pulse 62, temperature 98.4 F (36.9 C), temperature source Oral, resp. rate 18, height 5\' 5"  (1.651 m), weight 127.007 kg (280 lb), SpO2 98 %.  PHYSICAL EXAMINATION:  GENERAL:  65 y.o.-year-old patient lying in the bed with no acute distress.  EYES: Pupils equal, round, reactive to light and accommodation. No scleral icterus. Extraocular muscles intact.  HEENT: Head atraumatic, normocephalic. Oropharynx and nasopharynx clear.  NECK:  Supple, no jugular venous distention. No thyroid enlargement, no tenderness.  LUNGS: Moderate breath sounds bilaterally, no wheezing, rales,rhonchi or crepitation. No use of accessory muscles of respiration.  CARDIOVASCULAR: S1, S2 normal. No murmurs, rubs, or gallops.  ABDOMEN: Soft, nontender, nondistended. Bowel sounds present. No organomegaly or mass.  EXTREMITIES:Shoulder is tender to touch with the decreased range of motion, in sling for  support No pedal edema, cyanosis, or clubbing.  NEUROLOGIC: Cranial nerves II through XII are intact. Muscle strength 5/5 in all extremities. Sensation intact. Gait not checked.  PSYCHIATRIC: The patient is alert and oriented x 3.  SKIN: No obvious rash, lesion, or ulcer.    LABORATORY PANEL:   CBC  Recent Labs Lab 10/09/15 0607  WBC 11.3*  HGB 10.6*  HCT 32.0*  PLT 234   ------------------------------------------------------------------------------------------------------------------  Chemistries   Recent Labs Lab 10/10/15 0414  NA 136  K 5.5*  CL 104  CO2 26  GLUCOSE 130*  BUN 20  CREATININE 0.84  CALCIUM 8.5*   ------------------------------------------------------------------------------------------------------------------  Cardiac Enzymes No results for input(s): TROPONINI in the last 168 hours. ------------------------------------------------------------------------------------------------------------------  RADIOLOGY:  Ct Angio Chest Pe W Or Wo Contrast  10/09/2015  CLINICAL DATA:  Recent right shoulder surgery.  Shortness of breath. EXAM: CT ANGIOGRAPHY CHEST WITH CONTRAST TECHNIQUE: Multidetector CT imaging of the chest was performed using the standard protocol during bolus administration of intravenous contrast. Multiplanar CT image reconstructions and MIPs were obtained to evaluate the vascular anatomy. CONTRAST:  100 mL Isovue 370 COMPARISON:  None. FINDINGS: Mediastinum/Lymph Nodes: No pulmonary emboli or thoracic aortic dissection identified. Normal heart size. No pericardial effusion. No masses or pathologically enlarged lymph nodes identified. Lungs/Pleura: No pleural effusion or pneumothorax. Left lower lobe airspace disease. Airspace disease in the posterior segment of the right upper lobe with small area of hyperdense material within the airspace disease. There is a small area of reticular nodular airspace disease in the left upper lobe which may be  secondary to an infectious or inflammatory etiology. Elevation of the right diaphragm. Upper abdomen: Large bilateral fat containing adrenal masses most compatible with myelolipomas  which are incompletely visualized. There is evidence of prior gastric bypass. There is a small hiatal hernia. Musculoskeletal: No chest wall mass or suspicious bone lesions identified. There is mild lower thoracic spine spondylosis. There is a chronic T12 vertebral body compression fracture. Total reverse right shoulder arthroplasty. Review of the MIP images confirms the above findings. IMPRESSION: 1. No evidence pulmonary embolus. 2. Airspace disease in the posterior segment of the right upper lobe and right lower lobe with a small area of hyperdense material in the right upper lobe airspace disease. Differential considerations include atelectasis versus pneumonia including aspiration pneumonia. 3. Reticular nodular airspace disease in the left upper lobe which may be secondary to an infectious or inflammatory etiology. Electronically Signed   By: Kathreen Devoid   On: 10/09/2015 12:53   Dg Chest Port 1 View  10/09/2015  CLINICAL DATA:  COPD EXAM: PORTABLE CHEST 1 VIEW COMPARISON:  12/17/2014 FINDINGS: 1148 hours. Marked asymmetric elevation right hemidiaphragm. The cardio pericardial silhouette is enlarged. Vascular congestion without overt airspace pulmonary edema. There is some atelectasis in the right lower lung. Patient is status post right shoulder replacement. Telemetry leads overlie the chest. IMPRESSION: Marked asymmetric elevation right hemidiaphragm. Cardiomegaly. Electronically Signed   By: Misty Stanley M.D.   On: 10/09/2015 12:14    EKG:   Orders placed or performed in visit on 09/11/15  . EKG 12-Lead    ASSESSMENT AND PLAN:   1. Acute pneumonia per CT chest Clinically improving with current IV antibiotics Incentive spirometry Continue nebulizers as currently being done there is no evidence of wheezing sound  hold off on steroids   2. Diabetes type 2  sliding scale insulin continue metformin  3. Hyperkalemia potassium is 5.5, Kayexalate given repeat BMP in the morning  4. Atrial fibrillation continue flecainide and predexa  5. Sleep apnea continue CPAP at bedtime  6. Essential hypertension, blood pressure is elevated continue metoprolol and Continue lisinopril 20 mg by mouth once daily. Amlodipine  5 mg is added to the regimen  7.status post shoulder replacement -follow-up with orthopedics       All the records are reviewed and case discussed with Care Management/Social Workerr. Management plans discussed with the patient, family and they are in agreement.  CODE STATUS: fc   TOTAL TIME TAKING CARE OF THIS PATIENT: 36 minutes.   POSSIBLE D/C IN 1-2 DAYS, DEPENDING ON CLINICAL CONDITION.  Note: This dictation was prepared with Dragon dictation along with smaller phrase technology. Any transcriptional errors that result from this process are unintentional.   Nicholes Mango M.D on 10/10/2015 at 3:12 PM  Between 7am to 6pm - Pager - [phone] After 6pm go to [email or web address] - password [email or web address] [facility]  Office  [phone]  CC: Primary care physician; [name], DO

## 2015-10-11 DIAGNOSIS — J441 Chronic obstructive pulmonary disease with (acute) exacerbation: Secondary | ICD-10-CM | POA: Diagnosis not present

## 2015-10-11 DIAGNOSIS — K922 Gastrointestinal hemorrhage, unspecified: Secondary | ICD-10-CM

## 2015-10-11 DIAGNOSIS — Z96611 Presence of right artificial shoulder joint: Secondary | ICD-10-CM | POA: Diagnosis not present

## 2015-10-11 DIAGNOSIS — I480 Paroxysmal atrial fibrillation: Secondary | ICD-10-CM

## 2015-10-11 DIAGNOSIS — J449 Chronic obstructive pulmonary disease, unspecified: Secondary | ICD-10-CM | POA: Diagnosis not present

## 2015-10-11 DIAGNOSIS — Z6841 Body Mass Index (BMI) 40.0 and over, adult: Secondary | ICD-10-CM

## 2015-10-11 DIAGNOSIS — R0602 Shortness of breath: Secondary | ICD-10-CM

## 2015-10-11 DIAGNOSIS — J189 Pneumonia, unspecified organism: Secondary | ICD-10-CM | POA: Diagnosis not present

## 2015-10-11 DIAGNOSIS — G4730 Sleep apnea, unspecified: Secondary | ICD-10-CM | POA: Diagnosis not present

## 2015-10-11 LAB — CBC
HCT: 32.9 % — ABNORMAL LOW (ref 35.0–47.0)
HEMATOCRIT: 31.3 % — AB (ref 35.0–47.0)
HEMOGLOBIN: 10.8 g/dL — AB (ref 12.0–16.0)
HEMOGLOBIN: 10.3 g/dL — AB (ref 12.0–16.0)
MCH: 28.1 pg (ref 26.0–34.0)
MCH: 27.9 pg (ref 26.0–34.0)
MCHC: 32.9 g/dL (ref 32.0–36.0)
MCHC: 32.9 g/dL (ref 32.0–36.0)
MCV: 85.3 fL (ref 80.0–100.0)
MCV: 84.8 fL (ref 80.0–100.0)
Platelets: 221 10*3/uL (ref 150–440)
Platelets: 206 10*3/uL (ref 150–440)
RBC: 3.85 MIL/uL (ref 3.80–5.20)
RBC: 3.69 MIL/uL — ABNORMAL LOW (ref 3.80–5.20)
RDW: 15.8 % — ABNORMAL HIGH (ref 11.5–14.5)
RDW: 16.1 % — AB (ref 11.5–14.5)
WBC: 9.4 10*3/uL (ref 3.6–11.0)
WBC: 8 10*3/uL (ref 3.6–11.0)

## 2015-10-11 LAB — GLUCOSE, CAPILLARY
GLUCOSE-CAPILLARY: 130 mg/dL — AB (ref 65–99)
GLUCOSE-CAPILLARY: 190 mg/dL — AB (ref 65–99)
Glucose-Capillary: 126 mg/dL — ABNORMAL HIGH (ref 65–99)
Glucose-Capillary: 157 mg/dL — ABNORMAL HIGH (ref 65–99)

## 2015-10-11 LAB — BASIC METABOLIC PANEL
ANION GAP: 8 (ref 5–15)
BUN: 15 mg/dL (ref 6–20)
CHLORIDE: 102 mmol/L (ref 101–111)
CO2: 27 mmol/L (ref 22–32)
CREATININE: 0.6 mg/dL (ref 0.44–1.00)
Calcium: 8.4 mg/dL — ABNORMAL LOW (ref 8.9–10.3)
GFR calc non Af Amer: >60 mL/min (ref >60)
Glucose, Bld: 170 mg/dL — ABNORMAL HIGH (ref 65–99)
Potassium: 4 mmol/L (ref 3.5–5.1)
Sodium: 137 mmol/L (ref 135–145)

## 2015-10-11 LAB — SURGICAL PATHOLOGY

## 2015-10-11 LAB — OCCULT BLOOD X 1 CARD TO LAB, STOOL: Fecal Occult Bld: POSITIVE — AB

## 2015-10-11 MED ORDER — AMLODIPINE BESYLATE 10 MG PO TABS
10.0000 mg | ORAL_TABLET | Freq: Every day | ORAL | Status: DC
  Administered 2015-10-12 – 2015-10-13 (×2): 10 mg via ORAL
  Filled 2015-10-11 (×2): qty 1

## 2015-10-11 MED ORDER — PREDNISONE 50 MG PO TABS
50.0000 mg | ORAL_TABLET | Freq: Every day | ORAL | Status: DC
  Administered 2015-10-11 – 2015-10-12 (×2): 50 mg via ORAL
  Filled 2015-10-11 (×2): qty 1

## 2015-10-11 MED ORDER — LEVOFLOXACIN 500 MG PO TABS
500.0000 mg | ORAL_TABLET | Freq: Every day | ORAL | Status: DC
  Administered 2015-10-11 – 2015-10-12 (×2): 500 mg via ORAL
  Filled 2015-10-11 (×2): qty 1

## 2015-10-11 MED ORDER — IPRATROPIUM-ALBUTEROL 0.5-2.5 (3) MG/3ML IN SOLN
3.0000 mL | Freq: Four times a day (QID) | RESPIRATORY_TRACT | Status: DC
  Administered 2015-10-11 – 2015-10-13 (×6): 3 mL via RESPIRATORY_TRACT
  Filled 2015-10-11 (×7): qty 3

## 2015-10-11 MED ORDER — FLUCONAZOLE 50 MG PO TABS
150.0000 mg | ORAL_TABLET | Freq: Once | ORAL | Status: AC
  Administered 2015-10-11: 150 mg via ORAL
  Filled 2015-10-11: qty 3

## 2015-10-11 NOTE — Progress Notes (Signed)
Pt iv no longer working. Iv removed. Pt refusing to have new iv inserted.

## 2015-10-11 NOTE — Care Management (Signed)
Spoke with patient/husband regarding potential discharge today. Patient has audible wheezes (just ambulated to toilet). Husband does not want patient to discharge. I spoke with nurse after talking with Dr. [name]- patient has had NEB treatment and still wheezing. MD will see patient ~ 10 minutes.

## 2015-10-11 NOTE — Progress Notes (Signed)
Subjective: 3 Days Post-Op Procedure(s) (LRB): REVERSE SHOULDER ARTHROPLASTY (Right) Patient reports pain as mild.   Patient is well, but has had some minor complaints of wheezing. O2 slightly decreased from yesterday.  Plan is to go to [facility] following discharge from the hospital. Negative for chest pain but positive for shortness of breath.  Pt has history of COPD, uses nebulizers, inhalers as well as CPAP at home. Fever: no Gastrointestinal:Negative for nausea and vomiting  Objective: Vital signs in last 24 hours: Temp:  [97.8 F (36.6 C)-98.4 F (36.9 C)] 98.3 F (36.8 C) (07/21 0423) Pulse Rate:  [57-74] 65 (07/21 0423) Resp:  [18-19] 18 (07/21 0423) BP: (164-187)/(68-96) 164/68 mmHg (07/21 0423) SpO2:  [91 %-100 %] 91 % (07/21 0423) FiO2 (%):  [28 %] 28 % (07/20 1515)  Intake/Output from previous day:  Intake/Output Summary (Last 24 hours) at 10/11/15 0800 Last data filed at 10/11/15 0749  Gross per 24 hour  Intake    720 ml  Output   1100 ml  Net   -380 ml    Intake/Output this shift: Total I/O In: -  Out: 250 [Urine:250]  Labs:  Recent Labs  10/09/15 0607 10/11/15 0618  HGB 10.6* 10.8*    Recent Labs  10/09/15 0607 10/11/15 0618  WBC 11.3* 9.4  RBC 3.71* 3.85  HCT 32.0* 32.9*  PLT 234 221    Recent Labs  10/10/15 0414 10/11/15 0618  NA 136 137  K 5.5* 4.0  CL 104 102  CO2 26 27  BUN 20 15  CREATININE 0.84 0.60  GLUCOSE 130* 170*  CALCIUM 8.5* 8.4*   No results for input(s): LABPT, INR in the last 72 hours.   EXAM General - Patient is Alert, Appropriate and Oriented Extremity - ABD soft Sensation intact distally Incision: dressing C/D/I No cellulitis present  NVI RUI - homans sign bilaterally Lungs: Expiratory wheezing Dressing/Incision - clean, dry, no drainage Motor Function - intact, moving foot, fingers,and toes well on exam.    Past Medical History  Diagnosis Date  . Hypothyroidism   . Diabetes ([place])   . Venous  stasis dermatitis of both lower extremities   . Kidney disease   . Depression   . Dysthymic disorder   . Paroxysmal atrial fibrillation ([place]) 2007    In June 2015, Cardiac Event Monitor: Mostly SR/sinus arrhythmia with PVCs that are frequent. Short bursts of A. fib lasting several minutes;; CHA2DS2-VASc Score and unadjusted Ischemic Stroke Rate (% per year) is equal to 4.8 % stroke rate/year from a score of 4 (1 pt each for age, female, diabetes, hypertension)  . Asthma   . GERD (gastroesophageal reflux disease)   . Heart murmur     Echocardiogram June 2015: Mild MR (possible vegetation seen on TTE, not seen on TEE), normal LV size with moderate concentric LVH. Normal function EF 60-65%. Normal diastolic function. Mild LA dilation.  . DVT (deep venous thrombosis) (H[facility])     Righ calf  . Insomnia   . Cystocele   . Neuropathy involving both lower extremities ([place])   . Anxiety and depression   . Obesity, Class II, BMI 35-39.9, with comorbidity ([place])   . Kidney stone   . Adrenal mass ([place])   . Arthritis   . Peripheral vascular disease ([place])   . Sleep apnea     use C-PAP  . Thromboembolic deterrent (TED) stockings in place on both lower extremities   . Bronchitis   . Anxiety   . History of  IBS    Assessment/Plan: 3 Days Post-Op Procedure(s) (LRB): REVERSE SHOULDER ARTHROPLASTY (Right) Active Problems:   Status post reverse total shoulder replacement  Estimated body mass index is 46.59 kg/(m^2) as calculated from the following:   Height as of this encounter: 5\' 5"  (1.651 m).   Weight as of this encounter: 127.007 kg (280 lb). Advance diet Up with therapy   Hypokalemia resolved K+4.0 today.  PT with continued wheezing, currently on 2L of O2, Chest CT negative for PE.  Possible PNA, started on Levaquin. O2 down to 91 on 2 liters. Discharge to [facility] today pending medical clearance from internal medicine.    DVT Prophylaxis - TED hose and Pradaxa Non-weightbearing to the right  upper extremity  Duanne Guess, PA-C [facility] 10/11/2015, 8:00 AM

## 2015-10-11 NOTE — Consult Note (Signed)
   [facility] CM Inpatient Consult   10/11/2015  BG ZINN [birth year] [id number]   Patient screened for potential [place] Management services. Patient is eligible for [place]. Electronic medical record reveals patient's discharge plan is SNF. [facility] Care Management services not appropriate at this time. If patient's post hospital needs change please place a [facility] Care Management consult. For questions please contact:   Hau Sanor RN, [facility] Liaison  [phone]) Business Mobile [phone]) Toll free office

## 2015-10-11 NOTE — Consult Note (Signed)
Cardiology Consultation Note  Patient ID: [name], MRN: [id number], DOB/AGE: 10/26/1950 65 y.o. Admit date: 10/08/2015   Date of Consult: 10/11/2015 Primary Physician: [name], DO Primary Cardiologist: Dr. [name], MD Requesting Physician: Dr. [name], MD  Chief Complaint: Planned right arthroplasty  Reason for Consult: GI bleed on Pradaxa s/p Kayexalate for hyperkalemia    HPI: 65 y.o. female with h/o PAF on Pradaxa, COPD, PVD 2/2 DM, HTN, chronic shoulder pain, hypothyroidism, prior DVT, obesity, OSA on CPAP, anxiety, and venous stasis who presented to [facility] on 7/19 for planned orthopedic surgery for impingment/tendonopathy with cuff arthopathy s/p right toal arthroplasty on 7/18 who subsequently developed SOB in the post operative coarse found to be PNA with bronchoconstriction on CT. She subsequently developed heme positive stool this afternoon. Cardiology is consulted for management of Pradaxa with heme positive stool.   Patient was last seen by Dr. [name], MD on 09/11/15 for pre-operative evaluation for the above shoulder surgery. She was doing well at that time from a cardiac standpoint. Most recent stress test from 07/2015 showed breast attenuation but low risk with normal EF. Prior hospital admission for left leg hematoma which healed well. As a result of her hematoma and shoulder pain she has not been as active as usual leading to weight gain. She had not had any symptoms to suggest recurrent Afib as of June 2017.   She underwent successful right total arthroplasty on 7/18. Post operatively she was found to have a PNA on chest CT performed for SOB, study was negative for PE in the post operative state. She has been improving with IV antibiotics, incentive spirometry and nebs per IM. She was restarted on Pradaxa post operatively in the evening of 7/18 and has continued on it since in the post operative coarse, with the last dose being at 8:49 AM today. IM was notified by heme positive  stool this afternoon. She denies any history of melena or BRBPR at home. She had been constipated requiring multiple agents in an effort to have a BM. She was also treated with Kayexalate on 7/20 for her hyperkalemia which has led to large, frequent BM's this afternoon. HGB 2 weeks ago was 11.4, on 7/19 HGB was 10.6 and this morning HGB was 10.8. Currently, with improved breathing and on the bedside toilet.    Remaining labs were significant for a potassium of 6.8 on 7/19-->4.0 today s/p Kayexalate on 7/20.     Past Medical History  Diagnosis Date  . Hypothyroidism   . Diabetes ([place])   . Venous stasis dermatitis of both lower extremities   . Kidney disease   . Depression   . Dysthymic disorder   . Paroxysmal atrial fibrillation ([place]) 2007    In June 2015, Cardiac Event Monitor: Mostly SR/sinus arrhythmia with PVCs that are frequent. Short bursts of A. fib lasting several minutes;; CHA2DS2-VASc Score and unadjusted Ischemic Stroke Rate (% per year) is equal to 4.8 % stroke rate/year from a score of 4 (1 pt each for age, female, diabetes, hypertension)  . Asthma   . GERD (gastroesophageal reflux disease)   . Heart murmur     Echocardiogram June 2015: Mild MR (possible vegetation seen on TTE, not seen on TEE), normal LV size with moderate concentric LVH. Normal function EF 60-65%. Normal diastolic function. Mild LA dilation.  . DVT (deep venous thrombosis) (HCC)     Righ calf  . Insomnia   . Cystocele   . Neuropathy involving both lower extremities (  [place])   . Anxiety and depression   . Obesity, Class II, BMI 35-39.9, with comorbidity ([place])   . Kidney stone   . Adrenal mass ([place])   . Arthritis   . Peripheral vascular disease ([place])   . Sleep apnea     use C-PAP  . Thromboembolic deterrent (TED) stockings in place on both lower extremities   . Bronchitis   . Anxiety   . History of IBS       Most Recent Cardiac Studies: As above   Surgical History:  Past Surgical History    Procedure Laterality Date  . Hernia repair    . Ankle surgery Right   . Vaginal hysterectomy    . Sleeve gastroplasty    . Lithotripsy    . Kidney surgery    . Transthoracic echocardiogram  08/03/2013    Mild-Moderate concentric LVH, EF 60-65%. Normal diastolic function. Mild LA dilation. Mild MR with possible vegetation  - not confirmed on TEE   . Transesophageal echocardiogram  08/08/2013    Mild LVH, EF 60-65%. Moderate LA dilation and mild RA dilation. Mild MR with no evidence of stenosis and no evidence of endocarditis. A false chordae is noted.  . Tonsillectomy    . Total knee arthroplasty    . Hiatal hernia repair    . Joint replacement      left knee replacement  . Cholecystectomy    . I&d extremity Left 06/27/2015    Procedure: IRRIGATION AND DEBRIDEMENT EXTREMITY            ( CALF HEMATOMA ) POSSIBLE WOUND VAC;  Surgeon: [name], MD;  Location: [facility] ORS;  Service: Vascular;  Laterality: Left;  . Application of wound vac Left 06/27/2015    Procedure: APPLICATION OF WOUND VAC ( POSSIBLE ) ;  Surgeon: [name], MD;  Location: [facility] ORS;  Service: Vascular;  Laterality: Left;  . Removal of left hematoma    . Nm gated myoview ([facility] hx)  February 2017    Likely breast attenuation. LOW RISK study. Normal EF 55-60%.  . Reverse shoulder arthroplasty Right 10/08/2015    Procedure: REVERSE SHOULDER ARTHROPLASTY;  Surgeon: [name], MD;  Location: [facility] ORS;  Service: Orthopedics;  Laterality: Right;     Home Meds: Prior to Admission medications   Medication Sig Start Date End Date Taking? Authorizing Provider  albuterol (PROVENTIL HFA;VENTOLIN HFA) 108 (90 Base) MCG/ACT inhaler Inhale 1-2 puffs into the lungs every 4 (four) hours as needed for wheezing or shortness of breath. 03/29/15  Yes Bobetta Lime, MD  albuterol (PROVENTIL) (2.5 MG/3ML) 0.083% nebulizer solution Take 3 mLs (2.5 mg total) by nebulization every 6 (six) hours as needed for wheezing or shortness of breath.  02/21/15  Yes Ashok Norris, MD  ALPRAZolam Duanne Moron) 0.5 MG tablet TAKE ONE TABLET TWICE DAILY AS NEEDED 09/20/15  Yes Jayce G Cook, DO  buPROPion (WELLBUTRIN XL) 300 MG 24 hr tablet TAKE ONE TABLET BY MOUTH EVERY DAY 03/29/15  Yes Bobetta Lime, MD  dabigatran (PRADAXA) 150 MG CAPS capsule Take 150 mg by mouth 2 (two) times daily.   Yes Historical Provider, MD  DULoxetine (CYMBALTA) 60 MG capsule Take 1 capsule (60 mg total) by mouth daily. 09/17/15  Yes Milinda Pointer, MD  gabapentin (NEURONTIN) 800 MG tablet Take 1 tablet (800 mg total) by mouth every 6 (six) hours. 09/17/15  Yes Milinda Pointer, MD  HYDROcodone-acetaminophen (NORCO/VICODIN) 5-325 MG tablet Take 1 tablet by mouth every 8 (eight)  hours as needed for severe pain. 09/17/15  Yes Milinda Pointer, MD  levothyroxine (SYNTHROID, LEVOTHROID) 150 MCG tablet Take 1 tablet (150 mcg total) by mouth every morning. 07/20/15  Yes Arnetha Courser, MD  Melatonin 10 MG CAPS Take 20 mg by mouth at bedtime as needed. 09/17/15  Yes Milinda Pointer, MD  metoprolol tartrate (LOPRESSOR) 25 MG tablet Take 1 tablet (25 mg total) by mouth 2 (two) times daily. 07/11/15  Yes [name], MD  mirabegron ER (MYRBETRIQ) 25 MG TB24 tablet Take 1 tablet (25 mg total) by mouth daily. 11/09/14  Yes Shannon A McGowan, PA-C  montelukast (SINGULAIR) 10 MG tablet Take 1 tablet (10 mg total) by mouth daily. Patient taking differently: Take 10 mg by mouth at bedtime.  07/20/15  Yes Arnetha Courser, MD  pantoprazole (PROTONIX) 40 MG tablet TAKE 1 TABLET BY MOUTH TWO TIMES DAILY IF NEEDED. CAUTION: PROLONGED USE MAY INCREASE RISK OF PNEUMONIA, COLITIS, OSTEOPOROSIS, ANEMIA 09/25/15  Yes Jayce G Cook, DO  quinapril (ACCUPRIL) 20 MG tablet Take 1 tablet (20 mg total) by mouth daily. 08/26/15  Yes Arnetha Courser, MD  ESTRACE VAGINAL 0.1 MG/GM vaginal cream INSERT 1 GRAM VAGINALLY TWICE A WEEK 09/18/15   [name], DO  flecainide (TAMBOCOR) 100 MG tablet Take 2 tablets (200 mg  total) by mouth as needed (for afib. May take additional 100mg  flecainide six hours later if symptoms persist.). 06/26/15   [name], MD  levofloxacin (LEVAQUIN) 500 MG tablet Take 1 tablet (500 mg total) by mouth daily. 10/10/15   Lattie Corns, PA-C  metFORMIN (GLUCOPHAGE) 1000 MG tablet One pill by mouth every morning about 15 minutes before morning meal, and two pills 15 minutes before evening meal Patient taking differently: Take 1,000 mg by mouth daily with breakfast. 500 mg Metformin daily  before supper 07/30/15   Arnetha Courser, MD  oxyCODONE (OXY IR/ROXICODONE) 5 MG immediate release tablet Take 1-2 tablets (5-10 mg total) by mouth every 4 (four) hours as needed for breakthrough pain. 10/10/15   Lattie Corns, PA-C    Inpatient Medications:  . [START ON 10/12/2015] amLODipine  10 mg Oral Daily  . buPROPion  300 mg Oral Daily  . dabigatran  150 mg Oral BID  . docusate sodium  100 mg Oral BID  . DULoxetine  60 mg Oral Daily  . estradiol  1 Applicatorful Vaginal Daily  . gabapentin  800 mg Oral Q6H  . insulin aspart  0-9 Units Subcutaneous TID WC  . ipratropium-albuterol  3 mL Nebulization Q6H  . levofloxacin  500 mg Oral Daily  . levothyroxine  150 mcg Oral QAC breakfast  . lisinopril  20 mg Oral Daily  . metoprolol tartrate  25 mg Oral BID  . mirabegron ER  25 mg Oral Daily  . montelukast  10 mg Oral Daily  . pantoprazole  40 mg Oral Daily  . predniSONE  50 mg Oral Q breakfast      Allergies:  Allergies  Allergen Reactions  . Aspirin Hives  . Penicillins Hives  . Terramycin [Oxytetracycline] Hives    Social History   Social History  . Marital Status: Married    Spouse Name: N/A  . Number of Children: N/A  . Years of Education: N/A   Occupational History  . Not on file.   Social History Main Topics  . Smoking status: Never Smoker   . Smokeless tobacco: Never Used  . Alcohol Use: No  .  Drug Use: No  . Sexual Activity: No   Other Topics Concern    . Not on file   Social History Narrative   She is currently married -- for 28 years. Does not work. Does not smoke or take alcohol. She never smoked. She exercises at least 3 days a week since before her gastric surgery.   Marital status reviewed in history of present illness.     Family History  Problem Relation Age of Onset  . Skin cancer Father   . Diabetes Father   . Hypertension Father   . Peripheral vascular disease Father   . Cancer Father   . Cerebral aneurysm Father   . Alcohol abuse Father   . Varicose Veins Mother   . Kidney disease Mother   . Arthritis Mother   . Mental illness Sister   . Cancer Maternal Aunt     breast  . Breast cancer Maternal Aunt   . Arthritis Maternal Grandmother   . Hypertension Maternal Grandmother   . Diabetes Maternal Grandmother   . Arthritis Maternal Grandfather   . Heart disease Maternal Grandfather   . Hypertension Maternal Grandfather   . Arthritis Paternal Grandmother   . Hypertension Paternal Grandmother   . Diabetes Paternal Grandmother   . Arthritis Paternal Grandfather   . Hypertension Paternal Grandfather      Review of Systems: Review of Systems  Constitutional: Positive for malaise/fatigue. Negative for fever, chills, weight loss and diaphoresis.  HENT: Negative for congestion.   Eyes: Negative for discharge and redness.  Respiratory: Positive for cough and shortness of breath. Negative for sputum production and wheezing.   Cardiovascular: Negative for chest pain, palpitations, orthopnea, claudication, leg swelling and PND.  Gastrointestinal: Positive for melena. Negative for heartburn, nausea, vomiting, abdominal pain and blood in stool.  Genitourinary: Negative for hematuria.  Musculoskeletal: Negative for myalgias and falls.  Skin: Negative for rash.  Neurological: Positive for weakness. Negative for dizziness, tingling, tremors, sensory change, speech change, focal weakness and loss of consciousness.   Endo/Heme/Allergies: Bruises/bleeds easily.  Psychiatric/Behavioral: Negative for substance abuse. The patient is not nervous/anxious.   All other systems reviewed and are negative.   Labs: No results for input(s): CKTOTAL, CKMB, TROPONINI in the last 72 hours. Lab Results  Component Value Date   WBC 9.4 10/11/2015   HGB 10.8* 10/11/2015   HCT 32.9* 10/11/2015   MCV 85.3 10/11/2015   PLT 221 10/11/2015     Recent Labs Lab 10/11/15 0618  NA 137  K 4.0  CL 102  CO2 27  BUN 15  CREATININE 0.60  CALCIUM 8.4*  GLUCOSE 170*   Lab Results  Component Value Date   CHOL 173 08/05/2015   HDL 97 08/05/2015   LDLCALC 66 08/05/2015   TRIG 50 08/05/2015   No results found for: DDIMER  Radiology/Studies:  Ct Angio Chest Pe W Or Wo Contrast  10/09/2015  CLINICAL DATA:  Recent right shoulder surgery.  Shortness of breath. EXAM: CT ANGIOGRAPHY CHEST WITH CONTRAST TECHNIQUE: Multidetector CT imaging of the chest was performed using the standard protocol during bolus administration of intravenous contrast. Multiplanar CT image reconstructions and MIPs were obtained to evaluate the vascular anatomy. CONTRAST:  100 mL Isovue 370 COMPARISON:  None. FINDINGS: Mediastinum/Lymph Nodes: No pulmonary emboli or thoracic aortic dissection identified. Normal heart size. No pericardial effusion. No masses or pathologically enlarged lymph nodes identified. Lungs/Pleura: No pleural effusion or pneumothorax. Left lower lobe airspace disease. Airspace disease in the posterior segment  of the right upper lobe with small area of hyperdense material within the airspace disease. There is a small area of reticular nodular airspace disease in the left upper lobe which may be secondary to an infectious or inflammatory etiology. Elevation of the right diaphragm. Upper abdomen: Large bilateral fat containing adrenal masses most compatible with myelolipomas which are incompletely visualized. There is evidence of prior  gastric bypass. There is a small hiatal hernia. Musculoskeletal: No chest wall mass or suspicious bone lesions identified. There is mild lower thoracic spine spondylosis. There is a chronic T12 vertebral body compression fracture. Total reverse right shoulder arthroplasty. Review of the MIP images confirms the above findings. IMPRESSION: 1. No evidence pulmonary embolus. 2. Airspace disease in the posterior segment of the right upper lobe and right lower lobe with a small area of hyperdense material in the right upper lobe airspace disease. Differential considerations include atelectasis versus pneumonia including aspiration pneumonia. 3. Reticular nodular airspace disease in the left upper lobe which may be secondary to an infectious or inflammatory etiology. Electronically Signed   By: Kathreen Devoid   On: 10/09/2015 12:53   Dg Chest Port 1 View  10/09/2015  CLINICAL DATA:  COPD EXAM: PORTABLE CHEST 1 VIEW COMPARISON:  12/17/2014 FINDINGS: 1148 hours. Marked asymmetric elevation right hemidiaphragm. The cardio pericardial silhouette is enlarged. Vascular congestion without overt airspace pulmonary edema. There is some atelectasis in the right lower lung. Patient is status post right shoulder replacement. Telemetry leads overlie the chest. IMPRESSION: Marked asymmetric elevation right hemidiaphragm. Cardiomegaly. Electronically Signed   By: Misty Stanley M.D.   On: 10/09/2015 12:14   Dg Shoulder Right Port  10/08/2015  CLINICAL DATA:  Postoperative evaluation of the right shoulder. EXAM: PORTABLE RIGHT SHOULDER - 2+ VIEW COMPARISON:  Shoulder MRI 07/26/2015 FINDINGS: Two views of the right shoulder demonstrate postoperative changes compatible with right shoulder arthroplasty. Hardware appears intact. Overlying surgical staple line. Visualized right hemi thorax is unremarkable. IMPRESSION: Patient status post right shoulder arthroplasty. Electronically Signed   By: Lovey Newcomer M.D.   On: 10/08/2015 13:17     EKG: Interpreted by me showed: From 09/11/15 - sinus bradycardia, 58 bpm, no acute st/t changes Telemetry: Interpreted by me showed: Not on telemetry   Weights: Filed Weights   10/08/15 1248  Weight: 280 lb (127.007 kg)     Physical Exam: Blood pressure 82/67, pulse 62, temperature 97.8 F (36.6 C), temperature source Oral, resp. rate 22, height 5\' 5"  (1.651 m), weight 280 lb (127.007 kg), SpO2 93 %. Body mass index is 46.59 kg/(m^2). General: Well developed, well nourished, in no acute distress. Head: Normocephalic, atraumatic, sclera non-icteric, no xanthomas, nares are without discharge.  Neck: Negative for carotid bruits. JVD not elevated. Lungs: Coarse breath sounds. Breathing is unlabored. Heart: RRR with S1 S2. No murmurs, rubs, or gallops appreciated. Abdomen: Obese, soft, non-tender, non-distended with normoactive bowel sounds. No hepatomegaly. No rebound/guarding. No obvious abdominal masses. Msk:  Strength and tone appear normal for age. Extremities: No clubbing or cyanosis. No edema. Distal pedal pulses are 2+ and equal bilaterally. Neuro: Alert and oriented X 3. No facial asymmetry. No focal deficit. Moves all extremities spontaneously. Psych:  Responds to questions appropriately with a normal affect.    Assessment and Plan:  Principal Problem:   Status post reverse total shoulder replacement Active Problems:   Chronic anticoagulation    GI bleed   COPD exacerbation ([facility])   Paroxysmal atrial fibrillation ([facility])   CAP (community acquired pneumonia)  Morbid obesity with BMI of 40.0-44.9, adult ([facility])    1. GI bleed: -Heme positive stool. Uncertain at this time if this is related to her increased BM's s/p Doculax, Fleet's enema, and Kayexalate vs a lesion along the GI tract -Multiple large stools this afternoon possibly leading to irritation and bleeding -HGB appears stable at this time when compared to prior results and is c/w peri-operative bleeding -Given  the GI bleed this may be stable or trend down with further evaluations -Continue close monitoring of CBC -Hold Pradaxa for now until it is demonstrated HGB is stable -Recommend GI consult to evaluate GI bleed   2. PAF: -Currently, in sinus rhythm -Continue Lopressor for rate control -Pradaxa on hold per #1 -CHADS2VASc at least 5 (HTN, age x 1, DM, vascular disease, sex category)   3. Acute respiratory distress with hypoxia/CAP/COPD exacerbation: -Improving -Wean oxygen as able -Per IM   4. Right shoulder arthroplasty: -Per orthopedics   5. HTN: -Currently hypotensive with SBP in the 80's -Hold Norvasc -Decrease Lopressor to 12.5 mg bid for rate control until BP improves   6. Venous insufficieny: -Compression stockings advised   7. Obesity/OSA: -On CPAP   8. Hypothyroidism: -On replacement   9. DM: -Per IM    Signed, Christell Faith, PA-C [facility] Pager: [phone] 10/11/2015, 4:47 PM

## 2015-10-11 NOTE — Progress Notes (Signed)
PHARMACIST - PHYSICIAN COMMUNICATION DR:   Posey Pronto CONCERNING: Antibiotic IV to Oral Route Change Policy  RECOMMENDATION: This patient is receiving Levofloxacin by the intravenous route.  Based on criteria approved by the Pharmacy and Therapeutics Committee, the antibiotic(s) is/are being converted to the equivalent oral dose form(s).   DESCRIPTION: These criteria include:  Patient being treated for a respiratory tract infection, urinary tract infection, cellulitis or clostridium difficile associated diarrhea if on metronidazole  The patient is not neutropenic and does not exhibit a GI malabsorption state  The patient is eating (either orally or via tube) and/or has been taking other orally administered medications for a least 24 hours  The patient is improving clinically and has a Tmax < 100.5  If you have questions about this conversion, please contact the Pharmacy Department  []   [phone] )  [facility] [x]   [phone] )  [facility] []   [phone] )  [facility] []   [phone] )  [facility] []   [phone] )  [facility]    [name], PharmD

## 2015-10-11 NOTE — Progress Notes (Signed)
Pt stool is positive for blood. Dr [name] is aware and states she will consult cardiology. I verified with patient she takes pradaxa 150 twice a day. Dr [name] made aware of drug and dose.

## 2015-10-11 NOTE — Progress Notes (Signed)
Occupational Therapy Treatment Patient Details Name: [name] MRN: [id number] DOB: 18-Sep-1950 Today's Date: 10/11/2015    History of present illness Pt is a 65 yr old female s/p elective R reverse total shoulder replacement 7/18. PMH significant for HTN, DM, peripheral neuropathy, bilat venous stasis dermatitis, paroxysmal AFib, asthma, h/o DVTs, and chronic pain.    OT comments  Pt. Continues to present with limitations with basic ADL tasks secondary to her dominant right UE being immobilized following shoulder surgery. Pt. Continues to benefit skilled OT services for ADL and A/E training, functional mobility for ADLs, work simplification techniques, energy conservation, and pt. Education about home modifications.   Follow Up Recommendations  SNF    Equipment Recommendations       Recommendations for Other Services      Precautions / Restrictions Precautions Precautions: Fall Type of Shoulder Precautions: Reverse total shoulder Precaution Comments: Shoulder immobilizer with abduction pillow worn at all times (Right) Restrictions Weight Bearing Restrictions: Yes RUE Weight Bearing: Non weight bearing          Balance                                   ADL Overall ADL's : Needs assistance/impaired Eating/Feeding: Minimal assistance               Upper Body Dressing : Total assistance       Toilet Transfer: Maximal assistance;Supervision/safety                    Vision                     Perception     Praxis      Cognition   Behavior During Therapy: WFL for tasks assessed/performed Overall Cognitive Status: Within Functional Limits for tasks assessed                       Extremity/Trunk Assessment               Exercises   Shoulder Instructions       General Comments      Pertinent Vitals/ Pain       Pain Assessment: 0-10 Pain Score: 4  Pain Location: R shoulder   Home Living                                           Prior Functioning/Environment              Frequency Min 1X/week     Progress Toward Goals  OT Goals(current goals can now be found in the care plan section)     Acute Rehab OT Goals Patient Stated Goal: To return to PLOF OT Goal Formulation: With patient/family Time For Goal Achievement: 10/24/15 Potential to Achieve Goals: Good  Plan Discharge plan remains appropriate    Co-evaluation                 End of Session     Activity Tolerance Patient limited by pain   Patient Left in chair;with call bell/phone within reach;with chair alarm set;with family/visitor present   Nurse Communication          Time: 0300-0330 OT Time Calculation (min): 30 min  Charges: OT Treatments $Self Care/Home Management :  23-37 mins   Harrel Carina, MS, OTR/L  Harrel Carina 10/11/2015, 3:37 PM

## 2015-10-11 NOTE — Progress Notes (Addendum)
[facility] [place] NAME: [name]    MR#:  [id number]  DATE OF BIRTH:  Oct 10, 1950  SUBJECTIVE:  CHIEF COMPLAINT:  Patient is resting comfortably. Denies any chest pain or shortness of breath. Shoulder pain is manageable  REVIEW OF SYSTEMS:  CONSTITUTIONAL: No fever, fatigue or weakness.  EYES: No blurred or double vision.  EARS, NOSE, AND THROAT: No tinnitus or ear pain.  RESPIRATORY: No cough, shortness of breath, wheezing or hemoptysis.  CARDIOVASCULAR: No chest pain, orthopnea, edema.  GASTROINTESTINAL: No nausea, vomiting, diarrhea or abdominal pain.  GENITOURINARY: No dysuria, hematuria.  ENDOCRINE: No polyuria, nocturia,  HEMATOLOGY: No anemia, easy bruising or bleeding SKIN: No rash or lesion. MUSCULOSKELETAL:Shoulder pain is well tolerated with the current pain regimen NEUROLOGIC: No tingling, numbness, weakness.  PSYCHIATRY: No anxiety or depression.   DRUG ALLERGIES:   Allergies  Allergen Reactions  . Aspirin Hives  . Penicillins Hives  . Terramycin [Oxytetracycline] Hives    VITALS:  Blood pressure 156/58, pulse 54, temperature 98.3 F (36.8 C), temperature source Oral, resp. rate 18, height 5\' 5"  (1.651 m), weight 127.007 kg (280 lb), SpO2 99 %.  PHYSICAL EXAMINATION:  GENERAL:  65 y.o.-year-old patient lying in the bed with no acute distress.  EYES: Pupils equal, round, reactive to light and accommodation. No scleral icterus. Extraocular muscles intact.  HEENT: Head atraumatic, normocephalic. Oropharynx and nasopharynx clear.  NECK:  Supple, no jugular venous distention. No thyroid enlargement, no tenderness.  LUNGS: Moderate breath sounds bilaterally, no wheezing, rales,rhonchi or crepitation. No use of accessory muscles of respiration.  CARDIOVASCULAR: S1, S2 normal. No murmurs, rubs, or gallops.  ABDOMEN: Soft, nontender, nondistended. Bowel sounds present. No organomegaly or mass.  EXTREMITIES: No  pedal edema, cyanosis, or clubbing.  NEUROLOGIC: Cranial nerves II through XII are intact. Muscle strength 5/5 in all extremities. Sensation intact. Gait not checked.  PSYCHIATRIC: The patient is alert and oriented x 3.  SKIN: No obvious rash, lesion, or ulcer.    LABORATORY PANEL:   CBC  Recent Labs Lab 10/11/15 0618  WBC 9.4  HGB 10.8*  HCT 32.9*  PLT 221   ------------------------------------------------------------------------------------------------------------------  Chemistries   Recent Labs Lab 10/11/15 0618  NA 137  K 4.0  CL 102  CO2 27  GLUCOSE 170*  BUN 15  CREATININE 0.60  CALCIUM 8.4*   ------------------------------------------------------------------------------------------------------------------  Cardiac Enzymes No results for input(s): TROPONINI in the last 168 hours. ------------------------------------------------------------------------------------------------------------------  RADIOLOGY:  Ct Angio Chest Pe W Or Wo Contrast  10/09/2015  CLINICAL DATA:  Recent right shoulder surgery.  Shortness of breath. EXAM: CT ANGIOGRAPHY CHEST WITH CONTRAST TECHNIQUE: Multidetector CT imaging of the chest was performed using the standard protocol during bolus administration of intravenous contrast. Multiplanar CT image reconstructions and MIPs were obtained to evaluate the vascular anatomy. CONTRAST:  100 mL Isovue 370 COMPARISON:  None. FINDINGS: Mediastinum/Lymph Nodes: No pulmonary emboli or thoracic aortic dissection identified. Normal heart size. No pericardial effusion. No masses or pathologically enlarged lymph nodes identified. Lungs/Pleura: No pleural effusion or pneumothorax. Left lower lobe airspace disease. Airspace disease in the posterior segment of the right upper lobe with small area of hyperdense material within the airspace disease. There is a small area of reticular nodular airspace disease in the left upper lobe which may be secondary to an  infectious or inflammatory etiology. Elevation of the right diaphragm. Upper abdomen: Large bilateral fat containing adrenal masses most compatible with myelolipomas which are incompletely  visualized. There is evidence of prior gastric bypass. There is a small hiatal hernia. Musculoskeletal: No chest wall mass or suspicious bone lesions identified. There is mild lower thoracic spine spondylosis. There is a chronic T12 vertebral body compression fracture. Total reverse right shoulder arthroplasty. Review of the MIP images confirms the above findings. IMPRESSION: 1. No evidence pulmonary embolus. 2. Airspace disease in the posterior segment of the right upper lobe and right lower lobe with a small area of hyperdense material in the right upper lobe airspace disease. Differential considerations include atelectasis versus pneumonia including aspiration pneumonia. 3. Reticular nodular airspace disease in the left upper lobe which may be secondary to an infectious or inflammatory etiology. Electronically Signed   By: Kathreen Devoid   On: 10/09/2015 12:53    EKG:   Orders placed or performed in visit on 09/11/15  . EKG 12-Lead    ASSESSMENT AND PLAN:   1. Acute pneumonia per CT chestWith the bronchoconstriction Clinically improving with current IV antibiotics, continue the same Prednisone is added to the regimen including breathing treatments Incentive spirometry  2. Diabetes type 2  sliding scale insulin continue metformin  3. Hyperkalemia resolved, Kayexalate given   4. Atrial fibrillation continue flecainide and predexa  5. Sleep apnea continue CPAP at bedtime  6. Essential hypertension, blood pressure is elevated continue metoprolol and Continue lisinopril 20 mg by mouth once daily. Amlodipine  5 mg is added to the regimen, blood pressure is better we will increase the amlodipine to 10 mg  7.status post shoulder replacement -follow-up with orthopedics       All the records are reviewed  and case discussed with Care Management/Social Workerr. Management plans discussed with the patient, family and they are in agreement.  CODE STATUS: fc   TOTAL TIME TAKING CARE OF THIS PATIENT: 36 minutes.   POSSIBLE D/C IN 1-2 DAYS, DEPENDING ON CLINICAL CONDITION.  Note: This dictation was prepared with Dragon dictation along with smaller phrase technology. Any transcriptional errors that result from this process are unintentional.   Nicholes Mango M.D on 10/11/2015 at 12:33 PM  Between 7am to 6pm - Pager - [phone] After 6pm go to [email or web address] - password [email or web address] [facility]  Office  [phone]  CC: Primary care physician; [name], DO

## 2015-10-11 NOTE — Progress Notes (Signed)
Pt Respiratory status improved greatly this afternoon. Cardiology consulted as patient has had some bloody stools this afternoon. Holding pradaxa per cardiology.

## 2015-10-11 NOTE — Progress Notes (Signed)
Pt states she will self-initiate her home cpap.

## 2015-10-11 NOTE — Care Management Important Message (Signed)
Important Message  Patient Details  Name: [name] MRN: [id number] Date of Birth: 1951-01-15   Medicare Important Message Given:  Yes    Marshell Garfinkel, RN 10/11/2015, 8:52 AM

## 2015-10-11 NOTE — Progress Notes (Signed)
Patients stool appears to have blood in it. Sent stool to lab Dr [name] aware

## 2015-10-11 NOTE — Progress Notes (Signed)
Physical Therapy Treatment Patient Details Name: DECLAN ADAMSON MRN: [id number] DOB: 1950/11/09 Today's Date: 10-17-15    History of Present Illness Pt is a 65 yr old female s/p elective R reverse total shoulder replacement 7/18. PMH significant for HTN, DM, peripheral neuropathy, bilat venous stasis dermatitis, paroxysmal AFib, asthma, h/o DVTs, and chronic pain.     PT Comments    Pt in bed agreeable to therapy.  Supine to sit with minA for truncal support.  Pt performed ankle pumps, sit sit to stand x3 with minA.  Pt also performed open/close hands, wrist flexion/ext, "chugs" (per request of Dr. Roland Rack) within pt's tolerance.  Pt requesting to use BR, ambulated to BR and back to bed approx total 9f and returned to bed with minA.  Pt left in bed with husband present and all current needs met.   Follow Up Recommendations        Equipment Recommendations       Recommendations for Other Services       Precautions / Restrictions Precautions Precautions: Fall;Shoulder Type of Shoulder Precautions: Reverse total shoulder Precaution Comments: Shoulder immobilizer with abduction pillow worn at all times (Right) Restrictions Weight Bearing Restrictions: Yes RUE Weight Bearing: Non weight bearing    Mobility  Bed Mobility                  Transfers Overall transfer level: Needs assistance Equipment used: 1 person hand held assist Transfers: Sit to/from Stand              Ambulation/Gait Ambulation/Gait assistance: Min assist Ambulation Distance (Feet): 20 Feet Assistive device: 1 person hand held assist Gait Pattern/deviations: Step-through pattern   Gait velocity interpretation: Below normal speed for age/gender     Stairs            Wheelchair Mobility    Modified Rankin (Stroke Patients Only)       Balance                                    Cognition Arousal/Alertness: Awake/alert Behavior During Therapy: WFL for tasks  assessed/performed Overall Cognitive Status: Within Functional Limits for tasks assessed                      Exercises      General Comments        Pertinent Vitals/Pain Pain Assessment: 0-10 Pain Score: 5  Pain Location: R shoulder    Home Living                      Prior Function            PT Goals (current goals can now be found in the care plan section)      Frequency       PT Plan      Co-evaluation             End of Session           Time: [address]-4742PT Time Calculation (min) (ACUTE ONLY): 29 min  Charges:                       G Codes:      Joei Frangos 7Jul 27, 2017 12:40 PM Anastacio Bua, PTA

## 2015-10-11 NOTE — Progress Notes (Addendum)
Plan is for patient to D/C to [facility] tomorrow. Per Kim admissions coordinator at [facility] patient can come to room 222-B. RN will call report at [phone]. Health Team authorization has been received. Auth # [id number]. Clinical Education officer, museum (CSW) sent D/C orders to [employer] today via [employer]. Patient is aware of above. CSW contacted patient's husband [name] a left him a voicemail making him are of above. CSW will continue to follow and assist as needed.   [employer], LCSW [phone]

## 2015-10-12 ENCOUNTER — Inpatient Hospital Stay: Payer: PPO

## 2015-10-12 ENCOUNTER — Encounter: Payer: Self-pay | Admitting: Cardiology

## 2015-10-12 DIAGNOSIS — R0602 Shortness of breath: Secondary | ICD-10-CM | POA: Diagnosis not present

## 2015-10-12 DIAGNOSIS — I480 Paroxysmal atrial fibrillation: Secondary | ICD-10-CM | POA: Diagnosis not present

## 2015-10-12 DIAGNOSIS — J189 Pneumonia, unspecified organism: Secondary | ICD-10-CM | POA: Diagnosis not present

## 2015-10-12 DIAGNOSIS — K625 Hemorrhage of anus and rectum: Secondary | ICD-10-CM

## 2015-10-12 DIAGNOSIS — J441 Chronic obstructive pulmonary disease with (acute) exacerbation: Secondary | ICD-10-CM | POA: Diagnosis not present

## 2015-10-12 DIAGNOSIS — J449 Chronic obstructive pulmonary disease, unspecified: Secondary | ICD-10-CM | POA: Diagnosis not present

## 2015-10-12 DIAGNOSIS — G4730 Sleep apnea, unspecified: Secondary | ICD-10-CM | POA: Diagnosis not present

## 2015-10-12 LAB — CBC
HCT: 31.2 % — ABNORMAL LOW (ref 35.0–47.0)
HEMOGLOBIN: 10.5 g/dL — AB (ref 12.0–16.0)
MCH: 28.3 pg (ref 26.0–34.0)
MCHC: 33.5 g/dL (ref 32.0–36.0)
MCV: 84.2 fL (ref 80.0–100.0)
PLATELETS: 258 10*3/uL (ref 150–440)
RBC: 3.7 MIL/uL — ABNORMAL LOW (ref 3.80–5.20)
RDW: 16.3 % — ABNORMAL HIGH (ref 11.5–14.5)
WBC: 8.6 10*3/uL (ref 3.6–11.0)

## 2015-10-12 LAB — GLUCOSE, CAPILLARY
GLUCOSE-CAPILLARY: 118 mg/dL — AB (ref 65–99)
Glucose-Capillary: 177 mg/dL — ABNORMAL HIGH (ref 65–99)
Glucose-Capillary: 188 mg/dL — ABNORMAL HIGH (ref 65–99)
Glucose-Capillary: 247 mg/dL — ABNORMAL HIGH (ref 65–99)

## 2015-10-12 MED ORDER — METHYLPREDNISOLONE SODIUM SUCC 125 MG IJ SOLR
60.0000 mg | Freq: Three times a day (TID) | INTRAMUSCULAR | Status: DC
  Administered 2015-10-12 – 2015-10-13 (×4): 60 mg via INTRAVENOUS
  Filled 2015-10-12 (×4): qty 2

## 2015-10-12 MED ORDER — INSULIN ASPART 100 UNIT/ML ~~LOC~~ SOLN
0.0000 [IU] | Freq: Every day | SUBCUTANEOUS | Status: DC
  Administered 2015-10-12: 2 [IU] via SUBCUTANEOUS
  Filled 2015-10-12: qty 2

## 2015-10-12 MED ORDER — INSULIN ASPART 100 UNIT/ML ~~LOC~~ SOLN
0.0000 [IU] | Freq: Three times a day (TID) | SUBCUTANEOUS | Status: DC
  Administered 2015-10-12: 3 [IU] via SUBCUTANEOUS
  Administered 2015-10-13: 8 [IU] via SUBCUTANEOUS
  Administered 2015-10-13: 5 [IU] via SUBCUTANEOUS
  Filled 2015-10-12: qty 5
  Filled 2015-10-12: qty 3
  Filled 2015-10-12: qty 8

## 2015-10-12 MED ORDER — DABIGATRAN ETEXILATE MESYLATE 150 MG PO CAPS
150.0000 mg | ORAL_CAPSULE | Freq: Two times a day (BID) | ORAL | Status: DC
  Administered 2015-10-12 – 2015-10-13 (×2): 150 mg via ORAL
  Filled 2015-10-12 (×2): qty 1

## 2015-10-12 NOTE — Progress Notes (Signed)
[facility] [place] NAME: [name]    MR#:  [id number]  DATE OF BIRTH:  07-Nov-1950  SUBJECTIVE:  CHIEF COMPLAINT:  Patient is desaturating with min exertion Denies any chest pain or shortness of breath. Shoulder pain is manageable  REVIEW OF SYSTEMS:  CONSTITUTIONAL: No fever, fatigue or weakness.  EYES: No blurred or double vision.  EARS, NOSE, AND THROAT: No tinnitus or ear pain.  RESPIRATORY: No cough, shortness of breath,  reports wheezing, no hemoptysis.  CARDIOVASCULAR: No chest pain, orthopnea, edema.  GASTROINTESTINAL: No nausea, vomiting, diarrhea or abdominal pain.  GENITOURINARY: No dysuria, hematuria.  ENDOCRINE: No polyuria, nocturia,  HEMATOLOGY: No anemia, easy bruising or bleeding SKIN: No rash or lesion. MUSCULOSKELETAL:Shoulder pain is well tolerated with the current pain regimen NEUROLOGIC: No tingling, numbness, weakness.  PSYCHIATRY: No anxiety or depression.   DRUG ALLERGIES:   Allergies  Allergen Reactions  . Aspirin Hives  . Penicillins Hives  . Terramycin [Oxytetracycline] Hives    VITALS:  Blood pressure 150/54, pulse 64, temperature 97.9 F (36.6 C), temperature source Oral, resp. rate 20, height 5\' 5"  (1.651 m), weight 127.007 kg (280 lb), SpO2 92 %.  PHYSICAL EXAMINATION:  GENERAL:  65 y.o.-year-old patient lying in the bed with no acute distress.  EYES: Pupils equal, round, reactive to light and accommodation. No scleral icterus. Extraocular muscles intact.  HEENT: Head atraumatic, normocephalic. Oropharynx and nasopharynx clear.  NECK:  Supple, no jugular venous distention. No thyroid enlargement, no tenderness.  LUNGS: Moderate breath sounds bilaterally, min wheezing, no  rales,rhonchi or crepitation. No use of accessory muscles of respiration.  CARDIOVASCULAR: S1, S2 normal. No murmurs, rubs, or gallops.  ABDOMEN: Soft, nontender, nondistended. Bowel sounds present. No organomegaly or  mass.  EXTREMITIES: No pedal edema, cyanosis, or clubbing.  NEUROLOGIC: Cranial nerves II through XII are intact. Muscle strength 5/5 in all extremities. Sensation intact. Gait not checked.  PSYCHIATRIC: The patient is alert and oriented x 3.  SKIN: No obvious rash, lesion, or ulcer.    LABORATORY PANEL:   CBC  Recent Labs Lab 10/12/15 0545  WBC 8.6  HGB 10.5*  HCT 31.2*  PLT 258   ------------------------------------------------------------------------------------------------------------------  Chemistries   Recent Labs Lab 10/11/15 0618  NA 137  K 4.0  CL 102  CO2 27  GLUCOSE 170*  BUN 15  CREATININE 0.60  CALCIUM 8.4*   ------------------------------------------------------------------------------------------------------------------  Cardiac Enzymes No results for input(s): TROPONINI in the last 168 hours. ------------------------------------------------------------------------------------------------------------------  RADIOLOGY:  Dg Chest 2 View  10/12/2015  CLINICAL DATA:  Shortness of breath EXAM: CHEST  2 VIEW COMPARISON:  October 09, 2015 FINDINGS: There is elevation of the right hemidiaphragm. Cardiomegaly remains. The hila and mediastinum are unchanged. No pulmonary nodules or masses. No focal infiltrate or overt edema. IMPRESSION: No active cardiopulmonary disease. Electronically Signed   By: Dorise Bullion III M.D   On: 10/12/2015 13:38    EKG:   Orders placed or performed in visit on 09/11/15  . EKG 12-Lead    ASSESSMENT AND PLAN:   1. Acute pneumonia per CT chestWith the bronchoconstriction Rpt cxr with no activedisease - 7/22 Clinically improving with current IV antibiotics, continue the same Solumedrol iv and  breathing treatments Incentive spirometry  2. Diabetes type 2  sliding scale insulin continue metformin  3. Hyperkalemia resolved, Kayexalate given   4. Atrial fibrillation and h/o DVT  FOBT positive but HB stable Cardio  recommended to continue flecainide and  pradaxa Rpt OP cbc and prn GI f/u   5. Sleep apnea continue CPAP at bedtime  6. Essential hypertension, blood pressure is elevated continue metoprolol and Continue lisinopril 20 mg by mouth once daily. Amlodipine  5 mg is added to the regimen, blood pressure is better we will increase the amlodipine to 10 mg  7.status post shoulder replacement -follow-up with orthopedics       All the records are reviewed and case discussed with Care Management/Social Workerr. Management plans discussed with the patient, family and they are in agreement.  CODE STATUS: fc   TOTAL TIME TAKING CARE OF THIS PATIENT: 36 minutes.   POSSIBLE D/C IN 1-2 DAYS, DEPENDING ON CLINICAL CONDITION.  Note: This dictation was prepared with Dragon dictation along with smaller phrase technology. Any transcriptional errors that result from this process are unintentional.   Nicholes Mango M.D on 10/12/2015 at 4:39 PM  Between 7am to 6pm - Pager - [phone] After 6pm go to [email or web address] - password [email or web address] [facility]  Office  [phone]  CC: Primary care physician; [name], DO

## 2015-10-12 NOTE — Clinical Social Work Note (Signed)
CSW was notified by RN that pt will be staying another night. CSW will update facility. CSW will continued to follow.   Darden Dates, MSW, LCSW  Clinical Social Worker  [phone]

## 2015-10-12 NOTE — Progress Notes (Signed)
Patient Name: [name] Date of Encounter: 10/12/2015  Hospital Problem List     Principal Problem:   Status post reverse total shoulder replacement Active Problems:   Paroxysmal atrial fibrillation ([place]); CHA2DS2-Vasc Score 5. On Pradaxa   Chronic anticoagulation    GI bleed   Morbid obesity with BMI of 40.0-44.9, adult ([place])   CAP (community acquired pneumonia)   COPD exacerbation ([place])   Shortness of breath   Patient with known history of PAF on Pradaxa for anticoagulation who is now status post right shoulder arthroplasty. She was noted to have guaiac positive stools after restarting Pradaxa - in the setting of significant constipation. She is noted to have a postop pneumonia following her shoulder surgery and is now being treated with antibiotics. She had a mild drop in hemoglobin that is now stable. She is maintaining sinus rhythm with controlled rate on beta blocker.   Subjective   Breathing better after regular breathing Rx. No further blood in stool. No sensation of rapid HR.  No PND/orthopnea or edema. (crhonic compression stockings 2/2 venous insufficiency) Frustrated with what was hopefully going to be a 1 day OP surgery & still being in patient.  SaO2 dropped off of O2 with ambulation  Inpatient Medications    . amLODipine  10 mg Oral Daily  . buPROPion  300 mg Oral Daily  . docusate sodium  100 mg Oral BID  . DULoxetine  60 mg Oral Daily  . estradiol  1 Applicatorful Vaginal Daily  . gabapentin  800 mg Oral Q6H  . insulin aspart  0-9 Units Subcutaneous TID WC  . ipratropium-albuterol  3 mL Nebulization Q6H  . levofloxacin  500 mg Oral Daily  . levothyroxine  150 mcg Oral QAC breakfast  . lisinopril  20 mg Oral Daily  . metoprolol tartrate  25 mg Oral BID  . mirabegron ER  25 mg Oral Daily  . montelukast  10 mg Oral Daily  . pantoprazole  40 mg Oral Daily  . predniSONE  50 mg Oral Q breakfast    Vital Signs    Filed Vitals:   10/11/15 1931  10/11/15 2038 10/12/15 0347 10/12/15 0816  BP: 138/69  165/67 156/61  Pulse: 66  66 70  Temp: 98.7 F (37.1 C)   97.6 F (36.4 C)  TempSrc: Oral   Oral  Resp: 20  18 20   Height:      Weight:      SpO2: 100% 100% 91% 93%   No intake or output data in the 24 hours ending 10/12/15 0924 Filed Weights   10/08/15 1248  Weight: 280 lb (127.007 kg)    Physical Exam    General: Pleasant, NAD. Neuro: Alert and oriented X 3. R arm immobilized with ice pack & brace. Psych: Normal Mood and affect. HEENT:  Normal  Neck: Supple without bruits or JVD. Lungs:  Resp regular and unlabored, CTA. - mild upper airways "wheeze/stridor". Heart: RRR, normal S1 and S2. No s3, s4, or murmurs. Abdomen: Soft, non-tender, non-distended, BS + x 4.  Extremities: No clubbing, cyanosis or edema. Compression stockings in place  Labs    CBC  Recent Labs  10/11/15 1951 10/12/15 0545  WBC 8.0 8.6  HGB 10.3* 10.5*  HCT 31.3* 31.2*  MCV 84.8 84.2  PLT 206 0000000   Basic Metabolic Panel  Recent Labs  10/10/15 0414 10/11/15 0618  NA 136 137  K 5.5* 4.0  CL 104 102  CO2 26 27  GLUCOSE 130* 170*  BUN 20 15  CREATININE 0.84 0.60  CALCIUM 8.5* 8.4*   Liver Function Tests No results for input(s): AST, ALT, ALKPHOS, BILITOT, PROT, ALBUMIN in the last 72 hours. No results for input(s): LIPASE, AMYLASE in the last 72 hours. Cardiac Enzymes No results for input(s): CKTOTAL, CKMB, CKMBINDEX, TROPONINI in the last 72 hours. BNP Invalid input(s): POCBNP D-Dimer No results for input(s): DDIMER in the last 72 hours. Hemoglobin A1C No results for input(s): HGBA1C in the last 72 hours. Fasting Lipid Panel No results for input(s): CHOL, HDL, LDLCALC, TRIG, CHOLHDL, LDLDIRECT in the last 72 hours. Thyroid Function Tests No results for input(s): TSH, T4TOTAL, T3FREE, THYROIDAB in the last 72 hours.  Invalid input(s): FREET3  Telemetry    Not on tele  ECG    No current EKG  Radiology    No new  imaging.  Assessment & Plan    Principal Problem:   Status post reverse total shoulder replacement  - Per ortho Active Problems:    Paroxysmal atrial fibrillation ([facility]); CHA2DS2-Vasc Score 5. On Pradaxa / Chronic anticoagulation   Continues to maintain sinus rhythm on beta blocker.  Hemoglobin remained stable, per yesterday's recommendations I agree with restarting Pradaxa. Can recheck hemoglobin as outpatient next week.  Also has history of DVT with lymphedema. End continued use of compression pumps. For now continue to use compression hose and SCDs. - Restart Pradaxa    GI bleed - in the setting of constipation with several large BM. Hemoglobin remained stable. Recommend outpatient follow-up with either PCP plus minus GI consultation.      CAP (community acquired pneumonia) - per medicine service   COPD exacerbation ([facility])/  Shortness of breath - per medicine service; still requiring O2 - suspect that she will need Home O2 on d/c    Continue CPAP    Greater than 50% was spent in counseling and coordination of care with patient Total time of patient: 15 min   Signed, [name], M.D., M.S.  [employer]  [address] [place], [place] [address] [phone] Fax [phone]

## 2015-10-12 NOTE — Progress Notes (Signed)
Occupational Therapy Treatment Patient Details Name: [name] MRN: [id number] DOB: November 01, 1950 Today's Date: 10/12/2015    History of present illness Pt is a 65 yr old female s/p elective R reverse total shoulder replacement 7/18. PMH significant for HTN, DM, peripheral neuropathy, bilat venous stasis dermatitis, paroxysmal AFib, asthma, h/o DVTs, and chronic pain.    OT comments  Pt. Continues to be limited by her dominant RUE being immobilized. Pt. Does have difficulty using her nondominant LUE for ADL tasks. Pt. Is able to complete transfers without difficulty, however has limited activity tolerance. O2 was 93% on room air. Pt. Continues to benefit from skilled OT services for ADL training, A/E training, energy conservation, and pt. Education about home modification to improve ADL functioning.   Follow Up Recommendations  SNF    Equipment Recommendations       Recommendations for Other Services      Precautions / Restrictions Precautions Precautions: Fall Type of Shoulder Precautions: Reverse total shoulder Shoulder Interventions: Shoulder sling/immobilizer;At all times Precaution Comments: Shoulder immobilizer with abduction pillow worn at all times (Right) Restrictions Weight Bearing Restrictions: Yes RUE Weight Bearing: Non weight bearing       Mobility   Balance    ADL   Eating/Feeding: Minimal assistance with set-up   Grooming: Moderate assistance            UE dressing: Limited by immobilizer.   Lower Body Dressing: Moderate assistance secondary to immobilizer. Reviewed A/E use for LE dressing.   Toilet Transfer: Pt. Is able to transfer without difficulty, however has difficulty using her LUE to reach to the right for clothing negotiation.             General ADL Comments: Pt. dominant RUE is immobilized which limits ADLs.      Vision                     Perception     Praxis      Cognition   Behavior During Therapy: WFL for  tasks assessed/performed Overall Cognitive Status: Within Functional Limits for tasks assessed                       Extremity/Trunk Assessment               Exercises   Shoulder Instructions       General Comments      Pertinent Vitals/ Pain       Pain Assessment: 0-10 Pain Score: 0-No pain Pain Location: R shoulder Pain Descriptors / Indicators: Aching Pain Intervention(s): Limited activity within patient's tolerance;Monitored during session;Ice applied  Home Living                                          Prior Functioning/Environment              Frequency Min 1X/week     Progress Toward Goals  OT Goals(current goals can now be found in the care plan section)     Acute Rehab OT Goals Patient Stated Goal: To return to PLOF OT Goal Formulation: With patient Time For Goal Achievement: 10/24/15 Potential to Achieve Goals: Good  Plan Discharge plan remains appropriate    Co-evaluation                 End of Session     Activity Tolerance Patient  limited by pain   Patient Left in chair;with call bell/phone within reach;with chair alarm set;with family/visitor present   Nurse Communication          Time: [phone] OT Time Calculation (min): 30 min  Charges: OT General Charges $OT Visit: 1 Procedure OT Treatments $Self Care/Home Management : 23-37 mins   Harrel Carina, MS, OTR/L   Harrel Carina 10/12/2015, 9:56 AM

## 2015-10-12 NOTE — Care Management Note (Signed)
Case Management Note  Patient Details  Name: [name] MRN: [id number] Date of Birth: 11-07-1950  Subjective/Objective:      Mr Forbess called and reported that he does not think [name] is well enough to return to [facility] today.  Apparently she desatted to 85% with PT this morning. April is calling the [facility] and requesting oxygen after she obtains the qualifying sats for new oxygen.           Action/Plan:   Expected Discharge Date:  10/09/15               Expected Discharge Plan:     In-House Referral:     Discharge planning Services     Post Acute Care Choice:  Home Health, Durable Medical Equipment Choice offered to:  Patient  DME Arranged:    DME Agency:     HH Arranged:  RN, PT, Social Work [employer] Agency:  [facility]  Status of Service:  In process, will continue to follow  If discussed at Long Length of Stay Meetings, dates discussed:    Additional Comments:  Koryn Charlot A, RN 10/12/2015, 9:07 AM

## 2015-10-12 NOTE — Progress Notes (Signed)
PT Cancellation Note  Patient Details Name: [name] MRN: [id number] DOB: 01/23/1951   Cancelled Treatment:    Reason Eval/Treat Not Completed: Medical issues which prohibited therapy. Per RN's note at 1110, PT attempted to f/u around 1308. Patient was being taken for chest X-ray following use of BSC with RN. Will f/u later if appropriate.   Dorice Lamas, PT, DPT 10/12/2015, 1:38 PM

## 2015-10-12 NOTE — Progress Notes (Addendum)
Subjective: 4 Days Post-Op Procedure(s) (LRB): REVERSE SHOULDER ARTHROPLASTY (Right) Patient reports pain as mild.   Patient is well, and has had no acute complaints or problems Continue physical therapy today.  Plan is to go Rehab after hospital stay. no nausea and no vomiting Patient denies any chest pains or shortness of breath. Pt states she slept very good last evening . No complaints.  Objective: Vital signs in last 24 hours: Temp:  [97.8 F (36.6 C)-98.7 F (37.1 C)] 98.7 F (37.1 C) (07/21 1931) Pulse Rate:  [54-69] 66 (07/22 0347) Resp:  [18-22] 18 (07/22 0347) BP: (82-181)/(58-69) 165/67 mmHg (07/22 0347) SpO2:  [91 %-100 %] 91 % (07/22 0347) well approximated incision Heels are non tender and elevated off the bed using rolled towels Intake/Output from previous day: 07/21 0701 - 07/22 0700 In: 240 [P.O.:240] Out: 350 [Urine:350] Intake/Output this shift:     Recent Labs  10/11/15 0618 10/11/15 1951 10/12/15 0545  HGB 10.8* 10.3* 10.5*    Recent Labs  10/11/15 1951 10/12/15 0545  WBC 8.0 8.6  RBC 3.69* 3.70*  HCT 31.3* 31.2*  PLT 206 258    Recent Labs  10/10/15 0414 10/11/15 0618  NA 136 137  K 5.5* 4.0  CL 104 102  CO2 26 27  BUN 20 15  CREATININE 0.84 0.60  GLUCOSE 130* 170*  CALCIUM 8.5* 8.4*   No results for input(s): LABPT, INR in the last 72 hours.  EXAM General - Patient is Alert, Appropriate and Oriented Extremity - Neurologically intact Neurovascular intact Sensation intact distally Intact pulses distally Dorsiflexion/Plantar flexion intact Compartment soft Dressing - dressing C/D/I Motor Function - intact, moving foot and toes well on exam.    Past Medical History  Diagnosis Date  . Hypothyroidism   . Diabetes ([place])   . Venous stasis dermatitis of both lower extremities   . Kidney disease   . Depression   . Dysthymic disorder   . Paroxysmal atrial fibrillation ([place]) 2007    In June 2015, Cardiac Event  Monitor: Mostly SR/sinus arrhythmia with PVCs that are frequent. Short bursts of A. fib lasting several minutes;; CHA2DS2-VASc Score and unadjusted Ischemic Stroke Rate (% per year) is equal to 4.8 % stroke rate/year from a score of 4 (1 pt each for age, female, diabetes, hypertension)  . Asthma   . GERD (gastroesophageal reflux disease)   . Heart murmur     Echocardiogram June 2015: Mild MR (possible vegetation seen on TTE, not seen on TEE), normal LV size with moderate concentric LVH. Normal function EF 60-65%. Normal diastolic function. Mild LA dilation.  . DVT (deep venous thrombosis) (HCC)     Righ calf  . Insomnia   . Cystocele   . Neuropathy involving both lower extremities ([place])   . Anxiety and depression   . Obesity, Class II, BMI 35-39.9, with comorbidity ([place])   . Kidney stone   . Adrenal mass ([place])   . Arthritis   . Peripheral vascular disease ([place])   . Sleep apnea     use C-PAP  . Thromboembolic deterrent (TED) stockings in place on both lower extremities   . Bronchitis   . Anxiety   . History of IBS     Assessment/Plan: 4 Days Post-Op Procedure(s) (LRB): REVERSE SHOULDER ARTHROPLASTY (Right) Principal Problem:   Status post reverse total shoulder replacement Active Problems:   Paroxysmal atrial fibrillation (HCC)   Chronic anticoagulation    Morbid obesity with BMI of 40.0-44.9, adult ([place])  CAP (community acquired pneumonia)   GI bleed   COPD exacerbation (HCC)   Shortness of breath  Estimated body mass index is 46.59 kg/(m^2) as calculated from the following:   Height as of this encounter: 5\' 5"  (1.651 m).   Weight as of this encounter: 127.007 kg (280 lb). Up with therapy Discharge to SNF  Labs: reviewed. Hgb up 10.5 DVT Prophylaxis - Foot Pumps and TED hose Weight-Bearing as tolerated   Restart Pradaxa Monitor hgb in rehab  [place]. Teviston McCulloch 10/12/2015, 7:13 AM

## 2015-10-12 NOTE — Progress Notes (Signed)
Dr. [name] notified patient is unable to tolerated room air. O2 saturation is 83 room air. O2 at 2L, saturation is 95. Patient having expiratory wheezes. Dr. Lenon Ahmadi to give breathing treatment and will round on patient.

## 2015-10-12 NOTE — Progress Notes (Signed)
Vance Peper, PA notified of patient's O2 saturation dropping to 85% with rest and then 0000000 with excertion on room air with physical therapy. Physical therapy placed patient back on 2L of O2. Rogers Blocker also notified that patient's husband is requesting patient to stay another night. Rogers Blocker acknowledged and stated to reevaluated patient's O2 saturation this afternoon.

## 2015-10-12 NOTE — Progress Notes (Signed)
Physical Therapy Treatment Patient Details Name: [name] MRN: [id number] DOB: 02-15-1951 Today's Date: 10/12/2015    History of Present Illness Pt is a 65 yr old female s/p elective R reverse total shoulder replacement 7/18. PMH significant for HTN, DM, peripheral neuropathy, bilat venous stasis dermatitis, paroxysmal AFib, asthma, h/o DVTs, and chronic pain.     PT Comments    Patient is a pleasant [name] s/p R reverse total shoulder replacement on 08 October 2015. Patient is progressing towards goals with good tolerance. Able to ambulate 60'x2 after rest break performing exercises with the first bout being without oxygen and the second bout being on 2L oxygen. She did experience drop in oxygen saturation levels at rest and during ambulation in today's session. Resting saturations ~85-86%, dropping to 83-84% with ambulation ~30'. When on 2L O2, oxygen saturations remained ~92-93%. Denies dizziness but lacks awareness of potential complications caused from low oxygen saturations. Reviewed signs/symptoms and explained precautions to take to prevent falls/injury if symptoms occur. Informed nurse of oxygen saturations who contacted PA.  Follow Up Recommendations  SNF     Equipment Recommendations       Recommendations for Other Services       Precautions / Restrictions Precautions Precautions: Fall Type of Shoulder Precautions: Reverse total shoulder Shoulder Interventions: Shoulder sling/immobilizer;At all times Restrictions Weight Bearing Restrictions: Yes RUE Weight Bearing: Non weight bearing    Mobility  Bed Mobility               General bed mobility comments: Not tested  Transfers Overall transfer level: Modified independent Equipment used: None Transfers: Sit to/from Stand Sit to Stand: Modified independent (Device/Increase time);From elevated surface         General transfer comment: Patient moved from sit to stand with use of L UE to push from  surface.  Ambulation/Gait Ambulation/Gait assistance: Min guard Ambulation Distance (Feet): 60 Feet Assistive device: None       General Gait Details: Patient ambulates 45' once prior to and once after completion of exercises at decreased cadence with wide BOS and decreased arm swing. Oxygen saturations dropped from 86% to 83-84% when tested at 30' without O2. When ambulating after exercises on 2L O2, saturations remained ~92-93%.   Stairs            Wheelchair Mobility    Modified Rankin (Stroke Patients Only)       Balance Overall balance assessment: No apparent balance deficits (not formally assessed) Sitting-balance support: Feet supported;Single extremity supported Sitting balance-Leahy Scale: Good       Standing balance-Leahy Scale: Fair Standing balance comment: Has tendency to wall/furniture grab                    Cognition Arousal/Alertness: Awake/alert Behavior During Therapy: WFL for tasks assessed/performed Overall Cognitive Status: Within Functional Limits for tasks assessed                      Exercises Other Exercises Other Exercises: Pendulums x2 min.; AROM elbow flexion/extension, pronation/supination, wrist flexion/extension/radial deviation/ulnar deviation/gripping x20    General Comments        Pertinent Vitals/Pain Pain Assessment: 0-10 Pain Score: 4  Pain Location: R shoulder Pain Descriptors / Indicators: Aching Pain Intervention(s): Limited activity within patient's tolerance;Monitored during session;Ice applied    Home Living                      Prior Function  PT Goals (current goals can now be found in the care plan section) Acute Rehab PT Goals Patient Stated Goal: To return to PLOF PT Goal Formulation: With patient Time For Goal Achievement: 10/22/15 Potential to Achieve Goals: Good Progress towards PT goals: Progressing toward goals    Frequency  BID    PT Plan Current plan  remains appropriate    Co-evaluation             End of Session Equipment Utilized During Treatment: Gait belt;Oxygen Activity Tolerance: Patient tolerated treatment well Patient left: in chair;with call bell/phone within reach     Time: 0820-0853 PT Time Calculation (min) (ACUTE ONLY): 33 min  Charges:  $Gait Training: 8-22 mins $Therapeutic Exercise: 8-22 mins                    G Codes:      Dorice Lamas, PT, DPT 10/12/2015, 9:40 AM

## 2015-10-13 DIAGNOSIS — I10 Essential (primary) hypertension: Secondary | ICD-10-CM | POA: Diagnosis not present

## 2015-10-13 DIAGNOSIS — Z96611 Presence of right artificial shoulder joint: Secondary | ICD-10-CM | POA: Diagnosis not present

## 2015-10-13 DIAGNOSIS — N3946 Mixed incontinence: Secondary | ICD-10-CM | POA: Diagnosis not present

## 2015-10-13 DIAGNOSIS — E039 Hypothyroidism, unspecified: Secondary | ICD-10-CM | POA: Diagnosis not present

## 2015-10-13 DIAGNOSIS — I872 Venous insufficiency (chronic) (peripheral): Secondary | ICD-10-CM | POA: Diagnosis not present

## 2015-10-13 DIAGNOSIS — N952 Postmenopausal atrophic vaginitis: Secondary | ICD-10-CM | POA: Diagnosis not present

## 2015-10-13 DIAGNOSIS — J189 Pneumonia, unspecified organism: Secondary | ICD-10-CM | POA: Diagnosis not present

## 2015-10-13 DIAGNOSIS — R6889 Other general symptoms and signs: Secondary | ICD-10-CM | POA: Diagnosis not present

## 2015-10-13 DIAGNOSIS — F419 Anxiety disorder, unspecified: Secondary | ICD-10-CM | POA: Diagnosis not present

## 2015-10-13 DIAGNOSIS — Z471 Aftercare following joint replacement surgery: Secondary | ICD-10-CM | POA: Diagnosis not present

## 2015-10-13 DIAGNOSIS — E1140 Type 2 diabetes mellitus with diabetic neuropathy, unspecified: Secondary | ICD-10-CM | POA: Diagnosis not present

## 2015-10-13 DIAGNOSIS — J4540 Moderate persistent asthma, uncomplicated: Secondary | ICD-10-CM | POA: Diagnosis not present

## 2015-10-13 DIAGNOSIS — Z7984 Long term (current) use of oral hypoglycemic drugs: Secondary | ICD-10-CM | POA: Diagnosis not present

## 2015-10-13 DIAGNOSIS — J441 Chronic obstructive pulmonary disease with (acute) exacerbation: Secondary | ICD-10-CM | POA: Diagnosis not present

## 2015-10-13 DIAGNOSIS — Z9989 Dependence on other enabling machines and devices: Secondary | ICD-10-CM | POA: Diagnosis not present

## 2015-10-13 DIAGNOSIS — E1151 Type 2 diabetes mellitus with diabetic peripheral angiopathy without gangrene: Secondary | ICD-10-CM | POA: Diagnosis not present

## 2015-10-13 DIAGNOSIS — R279 Unspecified lack of coordination: Secondary | ICD-10-CM | POA: Diagnosis not present

## 2015-10-13 DIAGNOSIS — F339 Major depressive disorder, recurrent, unspecified: Secondary | ICD-10-CM | POA: Diagnosis not present

## 2015-10-13 DIAGNOSIS — K219 Gastro-esophageal reflux disease without esophagitis: Secondary | ICD-10-CM | POA: Diagnosis not present

## 2015-10-13 DIAGNOSIS — Z9981 Dependence on supplemental oxygen: Secondary | ICD-10-CM | POA: Diagnosis not present

## 2015-10-13 DIAGNOSIS — R0602 Shortness of breath: Secondary | ICD-10-CM | POA: Diagnosis not present

## 2015-10-13 DIAGNOSIS — R262 Difficulty in walking, not elsewhere classified: Secondary | ICD-10-CM | POA: Diagnosis not present

## 2015-10-13 DIAGNOSIS — G4730 Sleep apnea, unspecified: Secondary | ICD-10-CM | POA: Diagnosis not present

## 2015-10-13 DIAGNOSIS — I480 Paroxysmal atrial fibrillation: Secondary | ICD-10-CM | POA: Diagnosis not present

## 2015-10-13 DIAGNOSIS — M6281 Muscle weakness (generalized): Secondary | ICD-10-CM | POA: Diagnosis not present

## 2015-10-13 DIAGNOSIS — Z7401 Bed confinement status: Secondary | ICD-10-CM | POA: Diagnosis not present

## 2015-10-13 LAB — GLUCOSE, CAPILLARY
GLUCOSE-CAPILLARY: 296 mg/dL — AB (ref 65–99)
Glucose-Capillary: 221 mg/dL — ABNORMAL HIGH (ref 65–99)

## 2015-10-13 LAB — CBC
HEMATOCRIT: 31.7 % — AB (ref 35.0–47.0)
HEMOGLOBIN: 10.5 g/dL — AB (ref 12.0–16.0)
MCH: 27.9 pg (ref 26.0–34.0)
MCHC: 33.1 g/dL (ref 32.0–36.0)
MCV: 84.3 fL (ref 80.0–100.0)
Platelets: 272 10*3/uL (ref 150–440)
RBC: 3.76 MIL/uL — ABNORMAL LOW (ref 3.80–5.20)
RDW: 16.5 % — ABNORMAL HIGH (ref 11.5–14.5)
WBC: 6.6 10*3/uL (ref 3.6–11.0)

## 2015-10-13 MED ORDER — QUINAPRIL HCL 20 MG PO TABS: 40.0000 mg | ORAL_TABLET | Freq: Every day | ORAL | 1 refills | Status: DC

## 2015-10-13 MED ORDER — FLUTICASONE-SALMETEROL 115-21 MCG/ACT IN AERO: 2.0000 | INHALATION_SPRAY | Freq: Two times a day (BID) | RESPIRATORY_TRACT | 12 refills | Status: DC

## 2015-10-13 MED ORDER — PREDNISONE 10 MG (21) PO TBPK: 10.0000 mg | ORAL_TABLET | Freq: Every day | ORAL | 0 refills | Status: DC

## 2015-10-13 NOTE — Progress Notes (Signed)
Physical Therapy Treatment Patient Details Name: [name] MRN: [id number] DOB: [birth year] Today's Date: 10/13/2015    History of Present Illness Pt is a 65 yr old female s/p elective R reverse total shoulder replacement 7/18. PMH significant for HTN, DM, peripheral neuropathy, bilat venous stasis dermatitis, paroxysmal AFib, asthma, h/o DVTs, and chronic pain.     PT Comments    Pt did well with PT and was able to increase distance but still had a drop in O2 (on room air) to the mid 80s.  She shows good motivation and overall effort but is anxious because of fatigue/O2 and is having some shoulder pain.  Pt will likely need O2 on discharge.  Follow Up Recommendations  SNF     Equipment Recommendations       Recommendations for Other Services       Precautions / Restrictions Precautions Precautions: Shoulder Type of Shoulder Precautions: Reverse total shoulder Shoulder Interventions: Shoulder sling/immobilizer;At all times Restrictions Weight Bearing Restrictions: Yes RUE Weight Bearing: Non weight bearing    Mobility  Bed Mobility               General bed mobility comments: Pt in recliner on arrival  Transfers Overall transfer level: Modified independent Equipment used: None Transfers: Sit to/from Stand Sit to Stand: Modified independent (Device/Increase time);From elevated surface         General transfer comment: Pt able to get to stand with good relative confidence and no assist  Ambulation/Gait Ambulation/Gait assistance: Supervision Ambulation Distance (Feet): 125 Feet Assistive device: None       General Gait Details: Pt with slow, but steady ambulation.  Her O2 was 88-90% before session, stays steady for ~50 ft and then drops as low as 84% with increased effort.  She does report feeling very fatigued by getting back to the room and was in the mid 80s (on room air) after sitting.    Stairs            Wheelchair Mobility    Modified  Rankin (Stroke Patients Only)       Balance                                    Cognition Arousal/Alertness: Awake/alert Behavior During Therapy: WFL for tasks assessed/performed Overall Cognitive Status: Within Functional Limits for tasks assessed                      Exercises General Exercises - Upper Extremity Shoulder Flexion: PROM;10 reps;Other (comment) (to ~75 in scaption plane, gentle end range oscillation) Shoulder ABduction: PROM;10 reps (to ~ 65 deg with gentle end range oscillation) Elbow Flexion: AROM;Strengthening;10 reps Elbow Extension: AROM;Strengthening;10 reps    General Comments        Pertinent Vitals/Pain Pain Assessment: 0-10 Pain Score: 6     Home Living                      Prior Function            PT Goals (current goals can now be found in the care plan section) Progress towards PT goals: Progressing toward goals    Frequency  BID    PT Plan Current plan remains appropriate    Co-evaluation             End of Session Equipment Utilized During Treatment: Gait belt Activity Tolerance: Patient  limited by fatigue Patient left: with chair alarm set;with call bell/phone within reach     Time: 0939-1010 PT Time Calculation (min) (ACUTE ONLY): 31 min  Charges:  $Gait Training: 8-22 mins $Therapeutic Exercise: 8-22 mins                    G Codes:      Kreg Shropshire, DPT 10/13/2015, 12:55 PM

## 2015-10-13 NOTE — Progress Notes (Signed)
Clinical Social Worker was informed that patient will be medically ready to discharge to [facility]. Patient in a agreement with plan. CSW called Maudie Mercury- Admissions Coordinator at [facility] to confirm that patient's bed is ready. Provided patient's room number 222 and number to call for report [phone] . All discharge information faxed to [facility] via [employer].   Call to patient's husband- [name], informed him patient would discharge to [facility]. RN will call report and patient will discharge to [facility] via husband around 11:30 AM  Ernest Pine, MSW, [place], [place] Social Worker [phone]

## 2015-10-13 NOTE — Care Management Note (Signed)
Case Management Note  Patient Details  Name: [name] MRN: [id number] Date of Birth: 11-Nov-1950  Subjective/Objective:   Discharge to SNF in 1-2 days per MD note.                  Action/Plan:   Expected Discharge Date:  10/09/15               Expected Discharge Plan:     In-House Referral:     Discharge planning Services     Post Acute Care Choice:  Home Health, Durable Medical Equipment Choice offered to:  Patient  DME Arranged:    DME Agency:     HH Arranged:  RN, PT, Social Work [employer] Agency:  [facility]  Status of Service:  In process, will continue to follow  If discussed at Long Length of Stay Meetings, dates discussed:    Additional Comments:  Nashley Cordoba A, RN 10/13/2015, 8:06 AM

## 2015-10-13 NOTE — Progress Notes (Signed)
Subjective: 5 Days Post-Op Procedure(s) (LRB): REVERSE SHOULDER ARTHROPLASTY (Right) Patient reports pain as mild.   Patient is much better today since starting the steriod . no more wheezing. resting very well. thinks she is ready to go to rehab We will start therapy today.  Plan is to go Rehab after hospital stay. no nausea and no vomiting Patient denies any chest pains or shortness of breath. Pt states the shoulder is 100 % better since adjusting the polar care  Objective: Vital signs in last 24 hours: Temp:  [97.5 F (36.4 C)-97.9 F (36.6 C)] 97.6 F (36.4 C) (07/23 0516) Pulse Rate:  [60-112] 70 (07/23 0516) Resp:  [18-20] 20 (07/23 0516) BP: (146-183)/(54-74) 183/63 (07/23 0516) SpO2:  [83 %-97 %] 93 % (07/23 0516) well approximated incision Heels are non tender and elevated off the bed using rolled towels Intake/Output from previous day: 07/22 0701 - 07/23 0700 In: 600 [P.O.:600] Out: 550 [Urine:550] Intake/Output this shift: No intake/output data recorded.   Recent Labs  10/11/15 0618 10/11/15 1951 10/12/15 0545 10/13/15 0440  HGB 10.8* 10.3* 10.5* 10.5*    Recent Labs  10/12/15 0545 10/13/15 0440  WBC 8.6 6.6  RBC 3.70* 3.76*  HCT 31.2* 31.7*  PLT 258 272    Recent Labs  10/11/15 0618  NA 137  K 4.0  CL 102  CO2 27  BUN 15  CREATININE 0.60  GLUCOSE 170*  CALCIUM 8.4*   No results for input(s): LABPT, INR in the last 72 hours.  EXAM General - Patient is Alert, Appropriate and Oriented Extremity - Neurologically intact Neurovascular intact Sensation intact distally Intact pulses distally Dorsiflexion/Plantar flexion intact Compartment soft Dressing - scant drainage Motor Function - intact, moving foot and toes well on exam.    Past Medical History:  Diagnosis Date  . Adrenal mass ([place])   . Anxiety   . Anxiety and depression   . Arthritis   . Asthma   . Bronchitis   . Cystocele   . Depression   . Diabetes ([place])   . DVT  (deep venous thrombosis) (HCC)    Righ calf  . Dysthymic disorder   . GERD (gastroesophageal reflux disease)   . Heart murmur    Echocardiogram June 2015: Mild MR (possible vegetation seen on TTE, not seen on TEE), normal LV size with moderate concentric LVH. Normal function EF 60-65%. Normal diastolic function. Mild LA dilation.  Marland Kitchen History of IBS   . Hypothyroidism   . Insomnia   . Kidney disease   . Kidney stone   . Neuropathy involving both lower extremities ([place])   . Obesity, Class II, BMI 35-39.9, with comorbidity ([place])   . Paroxysmal atrial fibrillation ([place]) 2007   In June 2015, Cardiac Event Monitor: Mostly SR/sinus arrhythmia with PVCs that are frequent. Short bursts of A. fib lasting several minutes;; CHA2DS2-VASc Score = 5 (age, Female, PVD, DM, HTN)  . Peripheral vascular disease ([place])   . Sleep apnea    use C-PAP  . Thromboembolic deterrent (TED) stockings in place on both lower extremities   . Venous stasis dermatitis of both lower extremities     Assessment/Plan: 5 Days Post-Op Procedure(s) (LRB): REVERSE SHOULDER ARTHROPLASTY (Right) Principal Problem:   Status post reverse total shoulder replacement Active Problems:   Paroxysmal atrial fibrillation (HCC); CHA2DS2-Vasc Score 5. On Pradaxa   Chronic anticoagulation    Morbid obesity with BMI of 40.0-44.9, adult ([place])   CAP (community acquired pneumonia)   GI bleed  COPD exacerbation (HCC)   Shortness of breath  Estimated body mass index is 46.59 kg/m as calculated from the following:   Height as of this encounter: 5\' 5"  (1.651 m).   Weight as of this encounter: 127 kg (280 lb). Up with therapy Discharge home with home health  Labs: cxr shows no change from one taken on 10/09/15 DVT Prophylaxis - pradaxa Cleared to go to rehab if ok with medicine Pt needs a bowel movement today prior to d/c Please change dressing prior to d/c  [name] R. [employer] [place] 10/13/2015, 7:38 AM

## 2015-10-13 NOTE — Progress Notes (Signed)
CSW was informed that patient's O2 stats dropped while ambulating. CSW called patient's husband and he feels it'll be safer to transport her via EMS. CSW completed EMS form.   Ernest Pine, MSW, LCSW, [place] Clinical Social Worker [phone]

## 2015-10-13 NOTE — Progress Notes (Addendum)
[facility] [place] NAME: [name]    MR#:  [id number]  DATE OF BIRTH:  March 17, 1951  SUBJECTIVE:  CHIEF COMPLAINT:  Patient is Saturating at 90% on 2 L while ambulating today feels better and wants to be discharged  REVIEW OF SYSTEMS:  CONSTITUTIONAL: No fever, fatigue or weakness.  EYES: No blurred or double vision.  EARS, NOSE, AND THROAT: No tinnitus or ear pain.  RESPIRATORY: No cough, shortness of breath,  reports wheezing, no hemoptysis.  CARDIOVASCULAR: No chest pain, orthopnea, edema.  GASTROINTESTINAL: No nausea, vomiting, diarrhea or abdominal pain.  GENITOURINARY: No dysuria, hematuria.  ENDOCRINE: No polyuria, nocturia,  HEMATOLOGY: No anemia, easy bruising or bleeding SKIN: No rash or lesion. MUSCULOSKELETAL:Shoulder pain is well tolerated with the current pain regimen NEUROLOGIC: No tingling, numbness, weakness.  PSYCHIATRY: No anxiety or depression.   DRUG ALLERGIES:   Allergies  Allergen Reactions  . Aspirin Hives  . Penicillins Hives  . Terramycin [Oxytetracycline] Hives    VITALS:  Blood pressure (!) 178/78, pulse 68, temperature 97.8 F (36.6 C), temperature source Oral, resp. rate 18, height 5\' 5"  (1.651 m), weight 127 kg (280 lb), SpO2 (!) 84 %.  PHYSICAL EXAMINATION:  GENERAL:  65 y.o.-year-old patient lying in the bed with no acute distress.  EYES: Pupils equal, round, reactive to light and accommodation. No scleral icterus. Extraocular muscles intact.  HEENT: Head atraumatic, normocephalic. Oropharynx and nasopharynx clear.  NECK:  Supple, no jugular venous distention. No thyroid enlargement, no tenderness.  LUNGS: Moderate breath sounds bilaterally, min wheezing, no  rales,rhonchi or crepitation. No use of accessory muscles of respiration.  CARDIOVASCULAR: S1, S2 normal. No murmurs, rubs, or gallops.  ABDOMEN: Soft, nontender, nondistended. Bowel sounds present. No organomegaly or mass.   EXTREMITIES: No pedal edema, cyanosis, or clubbing.  NEUROLOGIC: Cranial nerves II through XII are intact. Muscle strength 5/5 in all extremities. Sensation intact. Gait not checked.  PSYCHIATRIC: The patient is alert and oriented x 3.  SKIN: No obvious rash, lesion, or ulcer.    LABORATORY PANEL:   CBC  Recent Labs Lab 10/13/15 0440  WBC 6.6  HGB 10.5*  HCT 31.7*  PLT 272   ------------------------------------------------------------------------------------------------------------------  Chemistries   Recent Labs Lab 10/11/15 0618  NA 137  K 4.0  CL 102  CO2 27  GLUCOSE 170*  BUN 15  CREATININE 0.60  CALCIUM 8.4*   ------------------------------------------------------------------------------------------------------------------  Cardiac Enzymes No results for input(s): TROPONINI in the last 168 hours. ------------------------------------------------------------------------------------------------------------------  RADIOLOGY:  Dg Chest 2 View  Result Date: 10/12/2015 CLINICAL DATA:  Shortness of breath EXAM: CHEST  2 VIEW COMPARISON:  October 09, 2015 FINDINGS: There is elevation of the right hemidiaphragm. Cardiomegaly remains. The hila and mediastinum are unchanged. No pulmonary nodules or masses. No focal infiltrate or overt edema. IMPRESSION: No active cardiopulmonary disease. Electronically Signed   By: [name] M.D   On: 10/12/2015 13:38    EKG:   Orders placed or performed in visit on 09/11/15  . EKG 12-Lead    ASSESSMENT AND PLAN:   1. Acute pneumonia per CT chestWith the bronchoconstriction Rpt cxr with no activedisease - 7/22 Clinically improving with current antibiotics, continue the same Solumedrol iv Placed Or 2 by mouth prednisone and  continue breathing treatments Incentive spirometry Patient needs to be discharged with 2 L of oxygen via nasal cannula which can be tapered at the facility  2. Diabetes type 2  sliding scale insulin  continue metformin  3. Hyperkalemia resolved, Kayexalate given   4. Atrial fibrillation and h/o DVT  FOBT positive but HB stable Cardio recommended to continue flecainide and pradaxa Rpt OP cbc and prn GI f/u   5. Sleep apnea continue CPAP at bedtime  6. Essential hypertension, blood pressure is elevated continue metoprolol and Continue lisinopril 20 mg by mouth once daily. Amlodipine  5 mg is added to the regimen, blood pressure is better we will increase the amlodipine to 10 mg  7.status post shoulder replacement -follow-up with orthopedics   Okay to discharge patient from medical standpoint. We will sign off. Inform Dr. Roland [name] the attending physician      All the records are reviewed and case discussed with Care Management/Social Workerr. Management plans discussed with the patient, family and they are in agreement.  CODE STATUS: fc   TOTAL TIME TAKING CARE OF THIS PATIENT: 36 minutes.     Note: This dictation was prepared with Dragon dictation along with smaller phrase technology. Any transcriptional errors that result from this process are unintentional.   [name] M.D on 10/13/2015 at 1:18 PM  Between 7am to 6pm - Pager - [phone] After 6pm go to [email or web address] - password [email or web address] [facility]  Office  [phone]  CC: Primary care physician; Dawn Spikes, DO

## 2015-10-13 NOTE — Discharge Planning (Signed)
Patient 90% on RA at rest. Patient 84% ambulating on RA. Patient 90% on 2L when ambulating. Patient will need 2L with activity if discharged today.

## 2015-10-13 NOTE — Care Management Note (Deleted)
Case Management Note  Patient Details  Name: [name] MRN: [id number] Date of Birth: Jan 04, 1951  Subjective/Objective:                    Action/Plan:   Expected Discharge Date:  10/09/15               Expected Discharge Plan:     In-House Referral:     Discharge planning Services     Post Acute Care Choice:  Home Health, Durable Medical Equipment Choice offered to:  Patient  DME Arranged:    DME Agency:     HH Arranged:  RN, PT, Social Work [employer] Agency:  [place]  Status of Service:  In process, will continue to follow  If discussed at Long Length of Stay Meetings, dates discussed:    Additional Comments:  Adelyna Brockman A, RN 10/13/2015, 8:05 AM

## 2015-10-13 NOTE — Discharge Planning (Signed)
Patient IV removed. DC papers, printed and placed in packet to facility.  Med changes discussed with patient and told of Advair script sent to pharm.  All other scripts sent in packet and told of suggested FU appts.  Report called to [place], S/W Jewel Baize, LPN.  EMS contacted to transport on 2L O2. Going to room 222.  RN assessment and VS revealed stability for DC to facility.

## 2015-10-13 NOTE — Clinical Social Work Placement (Addendum)
   CLINICAL SOCIAL WORK PLACEMENT  NOTE  Date:  10/13/2015  Patient Details  Name: [name] MRN: [id number] Date of Birth: October 26, 1950  Clinical Social Work is seeking post-discharge placement for this patient at the [place] level of care (*CSW will initial, date and re-position this form in  chart as items are completed):  Yes   Patient/family provided with [facility] Work Department's list of facilities offering this level of care within the geographic area requested by the patient (or if unable, by the patient's family).  Yes   Patient/family informed of their freedom to choose among providers that offer the needed level of care, that participate in Medicare, Medicaid or managed care program needed by the patient, have an available bed and are willing to accept the patient.  Yes   Patient/family informed of [facility]'s ownership interest in [facility] and [facility], as well as of the fact that they are under no obligation to receive care at these facilities.  PASRR submitted to EDS on 10/08/15     PASRR number received on 10/08/15     Existing PASRR number confirmed on       FL2 transmitted to all facilities in geographic area requested by pt/family on 10/08/15     FL2 transmitted to all facilities within larger geographic area on       Patient informed that his/her managed care company has contracts with or will negotiate with certain facilities, including the following:        Yes   Patient/family informed of bed offers received.  Patient chooses bed at  [facility] )     Physician recommends and patient chooses bed at      Patient to be transferred to  ([facility]) on 10/13/15.  Patient to be transferred to facility by HusbandMerry Proud     Patient family notified on 10/13/15 of transfer.  Name of family member notified:   [name]- Husband )     PHYSICIAN       Additional Comment:     _______________________________________________ Baldemar Lenis, LCSW 10/13/2015, 10:28 AM

## 2015-10-14 DIAGNOSIS — F329 Major depressive disorder, single episode, unspecified: Secondary | ICD-10-CM | POA: Diagnosis not present

## 2015-10-14 DIAGNOSIS — M159 Polyosteoarthritis, unspecified: Secondary | ICD-10-CM | POA: Diagnosis not present

## 2015-10-14 DIAGNOSIS — I82723 Chronic embolism and thrombosis of deep veins of upper extremity, bilateral: Secondary | ICD-10-CM | POA: Diagnosis not present

## 2015-10-14 DIAGNOSIS — J45901 Unspecified asthma with (acute) exacerbation: Secondary | ICD-10-CM | POA: Diagnosis not present

## 2015-10-14 DIAGNOSIS — J189 Pneumonia, unspecified organism: Secondary | ICD-10-CM | POA: Diagnosis not present

## 2015-10-14 DIAGNOSIS — I739 Peripheral vascular disease, unspecified: Secondary | ICD-10-CM | POA: Diagnosis not present

## 2015-10-14 DIAGNOSIS — E119 Type 2 diabetes mellitus without complications: Secondary | ICD-10-CM | POA: Diagnosis not present

## 2015-10-16 ENCOUNTER — Telehealth: Payer: Self-pay

## 2015-10-16 ENCOUNTER — Other Ambulatory Visit: Payer: Self-pay | Admitting: Family Medicine

## 2015-10-16 DIAGNOSIS — J4540 Moderate persistent asthma, uncomplicated: Secondary | ICD-10-CM

## 2015-10-16 DIAGNOSIS — R300 Dysuria: Secondary | ICD-10-CM | POA: Diagnosis not present

## 2015-10-16 LAB — URINALYSIS COMPLETE WITH MICROSCOPIC (ARMC ONLY)
BILIRUBIN URINE: NEGATIVE
Bacteria, UA: NONE SEEN
GLUCOSE, UA: NEGATIVE mg/dL
HGB URINE DIPSTICK: NEGATIVE
KETONES UR: NEGATIVE mg/dL
LEUKOCYTES UA: NEGATIVE
NITRITE: NEGATIVE
Protein, ur: NEGATIVE mg/dL
Specific Gravity, Urine: 1.018 (ref 1.005–1.030)
pH: 5 (ref 5.0–8.0)

## 2015-10-16 NOTE — Telephone Encounter (Signed)
Unable to reach patient.  Attempt to follow up with transitional care management.  Left detailed message (HIPPA compliant) regarding and requesting a call back.

## 2015-10-17 LAB — URINE CULTURE: Culture: NO GROWTH

## 2015-10-18 ENCOUNTER — Other Ambulatory Visit: Payer: Self-pay

## 2015-10-18 DIAGNOSIS — M7989 Other specified soft tissue disorders: Secondary | ICD-10-CM | POA: Diagnosis not present

## 2015-10-18 DIAGNOSIS — I10 Essential (primary) hypertension: Secondary | ICD-10-CM | POA: Diagnosis not present

## 2015-10-18 DIAGNOSIS — E119 Type 2 diabetes mellitus without complications: Secondary | ICD-10-CM | POA: Diagnosis not present

## 2015-10-18 DIAGNOSIS — I890 Lymphedema, not elsewhere classified: Secondary | ICD-10-CM | POA: Diagnosis not present

## 2015-10-18 DIAGNOSIS — L97211 Non-pressure chronic ulcer of right calf limited to breakdown of skin: Secondary | ICD-10-CM | POA: Diagnosis not present

## 2015-10-18 DIAGNOSIS — M79609 Pain in unspecified limb: Secondary | ICD-10-CM | POA: Diagnosis not present

## 2015-10-18 DIAGNOSIS — J4540 Moderate persistent asthma, uncomplicated: Secondary | ICD-10-CM

## 2015-10-18 DIAGNOSIS — I87091 Postthrombotic syndrome with other complications of right lower extremity: Secondary | ICD-10-CM | POA: Diagnosis not present

## 2015-10-18 DIAGNOSIS — M549 Dorsalgia, unspecified: Secondary | ICD-10-CM | POA: Diagnosis not present

## 2015-10-18 DIAGNOSIS — M7981 Nontraumatic hematoma of soft tissue: Secondary | ICD-10-CM | POA: Diagnosis not present

## 2015-10-22 ENCOUNTER — Encounter
Admission: RE | Admit: 2015-10-22 | Discharge: 2015-10-22 | Disposition: A | Discharge status: Home / Self Care | Payer: PPO | Source: Ambulatory Visit | Attending: Internal Medicine | Admitting: Internal Medicine

## 2015-10-22 DIAGNOSIS — J189 Pneumonia, unspecified organism: Secondary | ICD-10-CM | POA: Diagnosis not present

## 2015-10-22 DIAGNOSIS — Z9989 Dependence on other enabling machines and devices: Secondary | ICD-10-CM | POA: Diagnosis not present

## 2015-10-22 DIAGNOSIS — R262 Difficulty in walking, not elsewhere classified: Secondary | ICD-10-CM | POA: Diagnosis not present

## 2015-10-22 DIAGNOSIS — N3946 Mixed incontinence: Secondary | ICD-10-CM | POA: Diagnosis not present

## 2015-10-22 DIAGNOSIS — F419 Anxiety disorder, unspecified: Secondary | ICD-10-CM | POA: Diagnosis not present

## 2015-10-22 DIAGNOSIS — E1151 Type 2 diabetes mellitus with diabetic peripheral angiopathy without gangrene: Secondary | ICD-10-CM | POA: Diagnosis not present

## 2015-10-22 DIAGNOSIS — N952 Postmenopausal atrophic vaginitis: Secondary | ICD-10-CM | POA: Diagnosis not present

## 2015-10-22 DIAGNOSIS — F339 Major depressive disorder, recurrent, unspecified: Secondary | ICD-10-CM | POA: Diagnosis not present

## 2015-10-22 DIAGNOSIS — I480 Paroxysmal atrial fibrillation: Secondary | ICD-10-CM | POA: Diagnosis not present

## 2015-10-22 DIAGNOSIS — Z96611 Presence of right artificial shoulder joint: Secondary | ICD-10-CM | POA: Diagnosis not present

## 2015-10-22 DIAGNOSIS — Z7984 Long term (current) use of oral hypoglycemic drugs: Secondary | ICD-10-CM | POA: Diagnosis not present

## 2015-10-22 DIAGNOSIS — E1140 Type 2 diabetes mellitus with diabetic neuropathy, unspecified: Secondary | ICD-10-CM | POA: Diagnosis not present

## 2015-10-22 DIAGNOSIS — G4730 Sleep apnea, unspecified: Secondary | ICD-10-CM | POA: Diagnosis not present

## 2015-10-22 DIAGNOSIS — J4540 Moderate persistent asthma, uncomplicated: Secondary | ICD-10-CM | POA: Diagnosis not present

## 2015-10-22 DIAGNOSIS — Z9981 Dependence on supplemental oxygen: Secondary | ICD-10-CM | POA: Diagnosis not present

## 2015-10-22 DIAGNOSIS — K219 Gastro-esophageal reflux disease without esophagitis: Secondary | ICD-10-CM | POA: Diagnosis not present

## 2015-10-22 DIAGNOSIS — I10 Essential (primary) hypertension: Secondary | ICD-10-CM | POA: Diagnosis not present

## 2015-10-22 DIAGNOSIS — R279 Unspecified lack of coordination: Secondary | ICD-10-CM | POA: Diagnosis not present

## 2015-10-22 DIAGNOSIS — I872 Venous insufficiency (chronic) (peripheral): Secondary | ICD-10-CM | POA: Diagnosis not present

## 2015-10-22 DIAGNOSIS — Z471 Aftercare following joint replacement surgery: Secondary | ICD-10-CM | POA: Diagnosis not present

## 2015-10-22 DIAGNOSIS — N390 Urinary tract infection, site not specified: Secondary | ICD-10-CM | POA: Insufficient documentation

## 2015-10-22 DIAGNOSIS — E039 Hypothyroidism, unspecified: Secondary | ICD-10-CM | POA: Diagnosis not present

## 2015-10-22 DIAGNOSIS — M6281 Muscle weakness (generalized): Secondary | ICD-10-CM | POA: Diagnosis not present

## 2015-10-23 DIAGNOSIS — E1151 Type 2 diabetes mellitus with diabetic peripheral angiopathy without gangrene: Secondary | ICD-10-CM | POA: Diagnosis not present

## 2015-10-23 DIAGNOSIS — N390 Urinary tract infection, site not specified: Secondary | ICD-10-CM | POA: Diagnosis not present

## 2015-10-23 LAB — GLUCOSE, CAPILLARY: GLUCOSE-CAPILLARY: 154 mg/dL — AB (ref 65–99)

## 2015-10-24 DIAGNOSIS — E1151 Type 2 diabetes mellitus with diabetic peripheral angiopathy without gangrene: Secondary | ICD-10-CM | POA: Diagnosis not present

## 2015-10-24 LAB — GLUCOSE, CAPILLARY: Glucose-Capillary: 144 mg/dL — ABNORMAL HIGH (ref 65–99)

## 2015-10-25 DIAGNOSIS — E1151 Type 2 diabetes mellitus with diabetic peripheral angiopathy without gangrene: Secondary | ICD-10-CM | POA: Diagnosis not present

## 2015-10-25 LAB — GLUCOSE, CAPILLARY: GLUCOSE-CAPILLARY: 125 mg/dL — AB (ref 65–99)

## 2015-10-26 DIAGNOSIS — E1151 Type 2 diabetes mellitus with diabetic peripheral angiopathy without gangrene: Secondary | ICD-10-CM | POA: Diagnosis not present

## 2015-10-26 LAB — GLUCOSE, CAPILLARY
GLUCOSE-CAPILLARY: 172 mg/dL — AB (ref 65–99)
GLUCOSE-CAPILLARY: 193 mg/dL — AB (ref 65–99)

## 2015-10-27 DIAGNOSIS — E1151 Type 2 diabetes mellitus with diabetic peripheral angiopathy without gangrene: Secondary | ICD-10-CM | POA: Diagnosis not present

## 2015-10-27 LAB — URINALYSIS COMPLETE WITH MICROSCOPIC (ARMC ONLY)
BACTERIA UA: NONE SEEN
Bilirubin Urine: NEGATIVE
GLUCOSE, UA: NEGATIVE mg/dL
HGB URINE DIPSTICK: NEGATIVE
Ketones, ur: NEGATIVE mg/dL
Nitrite: NEGATIVE
PH: 5 (ref 5.0–8.0)
Protein, ur: NEGATIVE mg/dL
Specific Gravity, Urine: 1.01 (ref 1.005–1.030)

## 2015-10-27 LAB — GLUCOSE, CAPILLARY: Glucose-Capillary: 129 mg/dL — ABNORMAL HIGH (ref 65–99)

## 2015-10-28 DIAGNOSIS — E1151 Type 2 diabetes mellitus with diabetic peripheral angiopathy without gangrene: Secondary | ICD-10-CM | POA: Diagnosis not present

## 2015-10-28 LAB — GLUCOSE, CAPILLARY: Glucose-Capillary: 172 mg/dL — ABNORMAL HIGH (ref 65–99)

## 2015-10-29 ENCOUNTER — Non-Acute Institutional Stay (SKILLED_NURSING_FACILITY): Payer: PPO | Admitting: Gerontology

## 2015-10-29 DIAGNOSIS — N952 Postmenopausal atrophic vaginitis: Secondary | ICD-10-CM

## 2015-10-29 DIAGNOSIS — M19011 Primary osteoarthritis, right shoulder: Secondary | ICD-10-CM

## 2015-10-29 LAB — URINE CULTURE

## 2015-10-29 NOTE — Progress Notes (Signed)
Location:      Place of Service:  SNF (31) Provider:  Toni Arthurs, NP-C  Dawn Spikes, DO  Patient Care Team: Dawn Spikes, DO as PCP - General (Family Medicine) [name], MD as Consulting Physician (Cardiology) [name], MD as Consulting Physician (Surgery)  Extended Emergency Contact Information Primary Emergency Contact: Gulledge,Jeffrey Address: [address]          [name] [address] [name] Phone: [phone] Mobile Phone: [phone] Relation: Spouse Secondary Emergency Contact: Katheren Puller Address: [address] [name] [address] [place] Phone: [phone] Relation: Daughter  Code Status:  Full Goals of care: Advanced Directive information Advanced Directives 10/08/2015  Does patient have an advance directive? Yes  Type of Paramedic of [place];Living will  Does patient want to make changes to advanced directive? Yes - information given  Copy of advanced directive(s) in chart? No - copy requested  Would patient like information on creating an advanced directive? -     Chief Complaint  Patient presents with  . Medical Management of Chronic Issues    HPI:  Pt is a 65 y.o. female seen today for medical management of chronic diseases. She is here for rehab following a Reverse Total Shoulder Arthroplasty. Her arm is in a sling. No edema noted. Fingers warm. Pt c/o vaginal burning with no particular relation to voiding. Reports it as very uncomfortable. Does not itch. no discharge. Pt has a known history of Atrophic Vaginitis and should be on Estrace Cream twice weekly. However, it is documented that she has been refusing this while here. Symptoms most likely related to the AV with urine irritation. No other c/o.      Past Medical History:  Diagnosis Date  . Adrenal mass ([place])   . Anxiety   . Anxiety and depression   . Arthritis   . Asthma   .  Bronchitis   . Cystocele   . Depression   . Diabetes ([place])   . DVT (deep venous thrombosis) ([facility])    Righ calf  . Dysthymic disorder   . GERD (gastroesophageal reflux disease)   . Heart murmur    Echocardiogram June 2015: Mild MR (possible vegetation seen on TTE, not seen on TEE), normal LV size with moderate concentric LVH. Normal function EF 60-65%. Normal diastolic function. Mild LA dilation.  [name] History of IBS   . Hypothyroidism   . Insomnia   . Kidney disease   . Kidney stone   . Neuropathy involving both lower extremities ([name])   . Obesity, Class II, BMI 35-39.9, with comorbidity ([place])   . Paroxysmal atrial fibrillation ([name]) 2007   In June 2015, Cardiac Event Monitor: Mostly SR/sinus arrhythmia with PVCs that are frequent. Short bursts of A. fib lasting several minutes;; CHA2DS2-VASc Score = 5 (age, Female, PVD, DM, HTN)  . Peripheral vascular disease ([name])   . Sleep apnea    use C-PAP  . Thromboembolic deterrent (TED) stockings in place on both lower extremities   . Venous stasis dermatitis of both lower extremities    Past Surgical History:  Procedure Laterality Date  . ANKLE SURGERY Right   . APPLICATION OF WOUND VAC Left 06/27/2015   Procedure: APPLICATION OF WOUND VAC ( POSSIBLE ) ;  Surgeon: [name], MD;  Location: [facility] ORS;  Service: Vascular;  Laterality: Left;  . CHOLECYSTECTOMY    .  HERNIA REPAIR    . HIATAL HERNIA REPAIR    . I&D EXTREMITY Left 06/27/2015   Procedure: IRRIGATION AND DEBRIDEMENT EXTREMITY            ( CALF HEMATOMA ) POSSIBLE WOUND VAC;  Surgeon: [name], MD;  Location: [facility] ORS;  Service: Vascular;  Laterality: Left;  . JOINT REPLACEMENT     left knee replacement  . KIDNEY SURGERY    . LITHOTRIPSY    . NM [name] HX)  February 2017   Likely breast attenuation. LOW RISK study. Normal EF 55-60%.  . removal of left hematoma    . REVERSE SHOULDER ARTHROPLASTY Right 10/08/2015   Procedure: REVERSE SHOULDER ARTHROPLASTY;  Surgeon:  [name], MD;  Location: [facility] ORS;  Service: Orthopedics;  Laterality: Right;  . SLEEVE GASTROPLASTY    . TONSILLECTOMY    . TOTAL KNEE ARTHROPLASTY    . TRANSESOPHAGEAL ECHOCARDIOGRAM  08/08/2013   Mild LVH, EF 60-65%. Moderate LA dilation and mild RA dilation. Mild MR with no evidence of stenosis and no evidence of endocarditis. A false chordae is noted.  . TRANSTHORACIC ECHOCARDIOGRAM  08/03/2013   Mild-Moderate concentric LVH, EF 60-65%. Normal diastolic function. Mild LA dilation. Mild MR with possible vegetation  - not confirmed on TEE   . VAGINAL HYSTERECTOMY      Allergies  Allergen Reactions  . Aspirin Hives  . Penicillins Hives  . Terramycin [Oxytetracycline] Hives      Medication List       Accurate as of 10/29/15  6:03 PM. Always use your most recent med list.          albuterol (2.5 MG/3ML) 0.083% nebulizer solution Commonly known as:  PROVENTIL Take 3 mLs (2.5 mg total) by nebulization every 6 (six) hours as needed for wheezing or shortness of breath.   albuterol 108 (90 Base) MCG/ACT inhaler Commonly known as:  PROVENTIL HFA;VENTOLIN HFA Inhale 1-2 puffs into the lungs every 4 (four) hours as needed for wheezing or shortness of breath.   ALPRAZolam 0.5 MG tablet Commonly known as:  XANAX TAKE ONE TABLET TWICE DAILY AS NEEDED   buPROPion 300 MG 24 hr tablet Commonly known as:  WELLBUTRIN XL TAKE ONE TABLET BY MOUTH EVERY DAY   dabigatran 150 MG Caps capsule Commonly known as:  PRADAXA Take 150 mg by mouth 2 (two) times daily.   DULoxetine 60 MG capsule Commonly known as:  CYMBALTA Take 1 capsule (60 mg total) by mouth daily.   ESTRACE VAGINAL 0.1 MG/GM vaginal cream Generic drug:  estradiol INSERT 1 GRAM VAGINALLY TWICE A WEEK   flecainide 100 MG tablet Commonly known as:  TAMBOCOR Take 2 tablets (200 mg total) by mouth as needed (for afib. May take additional 100mg  flecainide six hours later if symptoms persist.).   fluticasone-salmeterol  115-21 MCG/ACT inhaler Commonly known as:  ADVAIR HFA Inhale 2 puffs into the lungs 2 (two) times daily.   gabapentin 800 MG tablet Commonly known as:  NEURONTIN Take 1 tablet (800 mg total) by mouth every 6 (six) hours.   levofloxacin 500 MG tablet Commonly known as:  LEVAQUIN Take 1 tablet (500 mg total) by mouth daily.   levothyroxine 150 MCG tablet Commonly known as:  SYNTHROID, LEVOTHROID Take 1 tablet (150 mcg total) by mouth every morning.   Melatonin 10 MG Caps Take 20 mg by mouth at bedtime as needed.   meloxicam 15 MG tablet Commonly known as:  MOBIC Take by mouth.  metFORMIN 1000 MG tablet Commonly known as:  GLUCOPHAGE One pill by mouth every morning about 15 minutes before morning meal, and two pills 15 minutes before evening meal   metoprolol tartrate 25 MG tablet Commonly known as:  LOPRESSOR Take 1 tablet (25 mg total) by mouth 2 (two) times daily.   mirabegron ER 25 MG Tb24 tablet Commonly known as:  MYRBETRIQ Take 1 tablet (25 mg total) by mouth daily.   montelukast 10 MG tablet Commonly known as:  SINGULAIR Take 1 tablet (10 mg total) by mouth daily.   oxyCODONE 5 MG immediate release tablet Commonly known as:  Oxy IR/ROXICODONE Take 1-2 tablets (5-10 mg total) by mouth every 4 (four) hours as needed for breakthrough pain.   pantoprazole 40 MG tablet Commonly known as:  PROTONIX TAKE 1 TABLET BY MOUTH TWO TIMES DAILY IF NEEDED. CAUTION: PROLONGED USE MAY INCREASE RISK OF PNEUMONIA, COLITIS, OSTEOPOROSIS, ANEMIA   predniSONE 10 MG (21) Tbpk tablet Commonly known as:  STERAPRED UNI-PAK 21 TAB Take 1 tablet (10 mg total) by mouth daily. Take 6 tablets by mouth for 1 day followed by  5 tablets by mouth for 1 day followed by  4 tablets by mouth for 1 day followed by  3 tablets by mouth for 1 day followed by  2 tablets by mouth for 1 day followed by  1 tablet by mouth for a day and stop   quinapril 20 MG tablet Commonly known as:  ACCUPRIL Take 2  tablets (40 mg total) by mouth daily.   zolpidem 12.5 MG CR tablet Commonly known as:  AMBIEN CR Take by mouth.       Review of Systems  Constitutional: Negative for activity change, appetite change, chills, diaphoresis and fever.  HENT: Negative for congestion, sneezing, sore throat, trouble swallowing and voice change.   Respiratory: Negative for apnea, cough, choking, chest tightness, shortness of breath and wheezing.   Cardiovascular: Negative for chest pain, palpitations and leg swelling.  Gastrointestinal: Negative for abdominal distention, abdominal pain, constipation, diarrhea and nausea.  Genitourinary: Positive for vaginal pain (burning). Negative for difficulty urinating, dysuria, frequency and urgency.  Musculoskeletal: Positive for arthralgias (typical arthritis) and joint swelling. Negative for back pain, gait problem and myalgias.  Skin: Negative for color change, pallor, rash and wound.  Neurological: Negative for dizziness, tremors, syncope, speech difficulty, weakness, numbness and headaches.  Psychiatric/Behavioral: Negative for agitation and behavioral problems.  All other systems reviewed and are negative.   Immunization History  Administered Date(s) Administered  . Influenza, High Dose Seasonal PF 11/28/2014  . Pneumococcal Polysaccharide-23 12/17/2014   Pertinent  Health Maintenance Due  Topic Date Due  . OPHTHALMOLOGY EXAM  06/08/2014  . INFLUENZA VACCINE  10/22/2015  . FOOT EXAM  11/01/2015  . PNA vac Low Risk Adult (2 of 2 - PCV13) 12/17/2015  . HEMOGLOBIN A1C  02/05/2016  . MAMMOGRAM  10/16/2016  . COLONOSCOPY  03/23/2020  . DEXA SCAN  Completed   Fall Risk  09/17/2015 07/30/2015 07/12/2015 04/17/2015 03/26/2015  Falls in the past year? Yes Yes Yes Yes Yes  Number falls in past yr: 1 2 or more 2 or more 1 2 or more  Injury with Fall? No Yes Yes No Yes  Risk Factor Category  - - High Fall Risk - -  Risk for fall due to : - - Impaired mobility;Impaired  balance/gait - -  Follow up - - Education provided;Falls prevention discussed - -   Functional Status Survey:  Vitals:   10/29/15 1756  BP: (!) 144/73  Pulse: 89  Resp: 18  Temp: 97.9 F (36.6 C)  SpO2: 91%  Weight: 255 lb 8 oz (115.9 kg)   Body mass index is 42.52 kg/m. Physical Exam  Constitutional: She is oriented to person, place, and time. Vital signs are normal. She appears well-developed and well-nourished. She is active and cooperative. She does not appear ill. No distress.  HENT:  Head: Normocephalic and atraumatic.  Mouth/Throat: Uvula is midline, oropharynx is clear and moist and mucous membranes are normal. Mucous membranes are not pale, not dry and not cyanotic.  Eyes: Conjunctivae, EOM and lids are normal. Pupils are equal, round, and reactive to light.  Neck: Trachea normal, normal range of motion and full passive range of motion without pain. Neck supple. No JVD present. No tracheal deviation, no edema and no erythema present. No thyromegaly present.  Cardiovascular: Normal rate, regular rhythm, normal heart sounds, intact distal pulses and normal pulses.  Exam reveals no gallop, no distant heart sounds and no friction rub.   No murmur heard. Pulmonary/Chest: Effort normal and breath sounds normal. No accessory muscle usage. No respiratory distress. She has no wheezes. She has no rales. She exhibits no tenderness.  Abdominal: Soft. Normal appearance and bowel sounds are normal. She exhibits no distension and no ascites. There is no tenderness. There is no guarding.  Musculoskeletal: She exhibits edema and tenderness.       Right shoulder: She exhibits decreased range of motion, tenderness, swelling, laceration (surgical incision), pain and decreased strength. She exhibits normal pulse.  Expected osteoarthritis, stiffness; Right reverse total shoulder arthroplasty  Neurological: She is alert and oriented to person, place, and time. She has normal strength.  Skin:  Skin is warm, dry and intact. No rash noted. She is not diaphoretic. No cyanosis or erythema. No pallor. Nails show no clubbing.  Psychiatric: She has a normal mood and affect. Her speech is normal and behavior is normal. Judgment and thought content normal. Cognition and memory are normal.  Nursing note and vitals reviewed.   Labs reviewed:  Recent Labs  04/17/15 1237  10/09/15 0607 10/09/15 0857 10/10/15 0414 10/11/15 0618  NA 140  < > 135  --  136 137  K 5.0  < > 6.8* 5.5* 5.5* 4.0  CL 102  < > 105  --  104 102  CO2 28  < > 25  --  26 27  GLUCOSE 138*  < > 126*  --  130* 170*  BUN 32*  < > 20  --  20 15  CREATININE 0.90  < > 0.97  --  0.84 0.60  CALCIUM 9.6  < > 8.2*  --  8.5* 8.4*  MG 1.8  --   --   --   --   --   < > = values in this interval not displayed.  Recent Labs  04/17/15 1237 08/05/15 1058  AST 16 14*  ALT 15 14  ALKPHOS 82 79  BILITOT 0.7 0.4  PROT 7.3 7.0  ALBUMIN 4.3 3.8    Recent Labs  04/17/15 1237 06/24/15 0957  10/09/15 0607  10/11/15 1951 10/12/15 0545 10/13/15 0440  WBC 8.5 8.5  < > 11.3*  < > 8.0 8.6 6.6  NEUTROABS 6.8* 6.6*  --  9.8*  --   --   --   --   HGB 13.0 12.0  < > 10.6*  < > 10.3* 10.5* 10.5*  HCT 40.6  36.3  < > 32.0*  < > 31.3* 31.2* 31.7*  MCV 85.2 85.3  < > 86.2  < > 84.8 84.2 84.3  PLT 242 264  < > 234  < > 206 258 272  < > = values in this interval not displayed. Lab Results  Component Value Date   TSH 1.565 08/05/2015   Lab Results  Component Value Date   HGBA1C 6.6 (H) 08/05/2015   Lab Results  Component Value Date   CHOL 173 08/05/2015   HDL 97 08/05/2015   LDLCALC 66 08/05/2015   TRIG 50 08/05/2015   CHOLHDL 1.8 08/05/2015    Significant Diagnostic Results in last 30 days:  Dg Chest 2 View  Result Date: 10/12/2015 CLINICAL DATA:  Shortness of breath EXAM: CHEST  2 VIEW COMPARISON:  October 09, 2015 FINDINGS: There is elevation of the right hemidiaphragm. Cardiomegaly remains. The hila and mediastinum are  unchanged. No pulmonary nodules or masses. No focal infiltrate or overt edema. IMPRESSION: No active cardiopulmonary disease. Electronically Signed   By: Dorise Bullion III M.D   On: 10/12/2015 13:38   Ct Angio Chest Pe W Or Wo Contrast  Result Date: 10/09/2015 CLINICAL DATA:  Recent right shoulder surgery.  Shortness of breath. EXAM: CT ANGIOGRAPHY CHEST WITH CONTRAST TECHNIQUE: Multidetector CT imaging of the chest was performed using the standard protocol during bolus administration of intravenous contrast. Multiplanar CT image reconstructions and MIPs were obtained to evaluate the vascular anatomy. CONTRAST:  100 mL Isovue 370 COMPARISON:  None. FINDINGS: Mediastinum/Lymph Nodes: No pulmonary emboli or thoracic aortic dissection identified. Normal heart size. No pericardial effusion. No masses or pathologically enlarged lymph nodes identified. Lungs/Pleura: No pleural effusion or pneumothorax. Left lower lobe airspace disease. Airspace disease in the posterior segment of the right upper lobe with small area of hyperdense material within the airspace disease. There is a small area of reticular nodular airspace disease in the left upper lobe which may be secondary to an infectious or inflammatory etiology. Elevation of the right diaphragm. Upper abdomen: Large bilateral fat containing adrenal masses most compatible with myelolipomas which are incompletely visualized. There is evidence of prior gastric bypass. There is a small hiatal hernia. Musculoskeletal: No chest wall mass or suspicious bone lesions identified. There is mild lower thoracic spine spondylosis. There is a chronic T12 vertebral body compression fracture. Total reverse right shoulder arthroplasty. Review of the MIP images confirms the above findings. IMPRESSION: 1. No evidence pulmonary embolus. 2. Airspace disease in the posterior segment of the right upper lobe and right lower lobe with a small area of hyperdense material in the right upper  lobe airspace disease. Differential considerations include atelectasis versus pneumonia including aspiration pneumonia. 3. Reticular nodular airspace disease in the left upper lobe which may be secondary to an infectious or inflammatory etiology. Electronically Signed   By: Kathreen Devoid   On: 10/09/2015 12:53   Dg Chest Port 1 View  Result Date: 10/09/2015 CLINICAL DATA:  COPD EXAM: PORTABLE CHEST 1 VIEW COMPARISON:  12/17/2014 FINDINGS: 1148 hours. Marked asymmetric elevation right hemidiaphragm. The cardio pericardial silhouette is enlarged. Vascular congestion without overt airspace pulmonary edema. There is some atelectasis in the right lower lung. Patient is status post right shoulder replacement. Telemetry leads overlie the chest. IMPRESSION: Marked asymmetric elevation right hemidiaphragm. Cardiomegaly. Electronically Signed   By: Misty Stanley M.D.   On: 10/09/2015 12:14   Dg Shoulder Right Port  Result Date: 10/08/2015 CLINICAL DATA:  Postoperative evaluation of the  right shoulder. EXAM: PORTABLE RIGHT SHOULDER - 2+ VIEW COMPARISON:  Shoulder MRI 07/26/2015 FINDINGS: Two views of the right shoulder demonstrate postoperative changes compatible with right shoulder arthroplasty. Hardware appears intact. Overlying surgical staple line. Visualized right hemi thorax is unremarkable. IMPRESSION: Patient status post right shoulder arthroplasty. Electronically Signed   By: Lovey Newcomer M.D.   On: 10/08/2015 13:17    Assessment/Plan 1. Atrophic vaginitis Pt has been refusing the Estrace cream which she should be getting twice weekly. Pt c/o vaginal area burning, not necessarily with urination. Pt should resume use of estrace cream. Check UA, C&S in case there is also a UTI.  2. Primary osteoarthritis of right shoulder Pain well controlled. Continue wearing sling. Exercise elbow frequently. Ice to shoulder prn. Continue working with therapies  Family/ staff Communication:   Total Time: 25  minutes  Documentation: 15 minutes  Face to Face: 10 minutes  Family/Phone:   Labs/tests ordered:  UA, C&S  Medication list reviewed and assessed for continued appropriateness. Monthly medication orders reviewed and signed.  Vikki Ports, NP-C Geriatrics [facility] [phone], [place] [address] Cell Phone (Mon-Fri 8am-5pm):  [phone] On Call:  [phone] & follow prompts after 5pm & weekends Office Phone:  [phone] Office Fax:  [phone]

## 2015-10-30 DIAGNOSIS — E1151 Type 2 diabetes mellitus with diabetic peripheral angiopathy without gangrene: Secondary | ICD-10-CM | POA: Diagnosis not present

## 2015-10-30 LAB — GLUCOSE, CAPILLARY: Glucose-Capillary: 151 mg/dL — ABNORMAL HIGH (ref 65–99)

## 2015-10-31 DIAGNOSIS — E1151 Type 2 diabetes mellitus with diabetic peripheral angiopathy without gangrene: Secondary | ICD-10-CM | POA: Diagnosis not present

## 2015-10-31 LAB — GLUCOSE, CAPILLARY: GLUCOSE-CAPILLARY: 154 mg/dL — AB (ref 65–99)

## 2015-11-01 DIAGNOSIS — J45909 Unspecified asthma, uncomplicated: Secondary | ICD-10-CM | POA: Diagnosis not present

## 2015-11-01 DIAGNOSIS — M129 Arthropathy, unspecified: Secondary | ICD-10-CM | POA: Diagnosis not present

## 2015-11-01 DIAGNOSIS — E1151 Type 2 diabetes mellitus with diabetic peripheral angiopathy without gangrene: Secondary | ICD-10-CM | POA: Diagnosis not present

## 2015-11-01 DIAGNOSIS — Z96619 Presence of unspecified artificial shoulder joint: Secondary | ICD-10-CM | POA: Diagnosis not present

## 2015-11-01 DIAGNOSIS — E069 Thyroiditis, unspecified: Secondary | ICD-10-CM | POA: Diagnosis not present

## 2015-11-01 DIAGNOSIS — I10 Essential (primary) hypertension: Secondary | ICD-10-CM | POA: Diagnosis not present

## 2015-11-01 LAB — GLUCOSE, CAPILLARY: GLUCOSE-CAPILLARY: 121 mg/dL — AB (ref 65–99)

## 2015-11-02 DIAGNOSIS — E1151 Type 2 diabetes mellitus with diabetic peripheral angiopathy without gangrene: Secondary | ICD-10-CM | POA: Diagnosis not present

## 2015-11-02 LAB — GLUCOSE, CAPILLARY: Glucose-Capillary: 122 mg/dL — ABNORMAL HIGH (ref 65–99)

## 2015-11-05 ENCOUNTER — Telehealth: Payer: Self-pay

## 2015-11-05 DIAGNOSIS — Z96611 Presence of right artificial shoulder joint: Secondary | ICD-10-CM | POA: Diagnosis not present

## 2015-11-05 NOTE — Telephone Encounter (Addendum)
Transition Care Management Follow-up Telephone Call   Date discharged? 11/02/15   How have you been since you were released from the hospital? INTERMITTENT SOB.   TEARFUL.     Do you understand why you were in the hospital? YES, RIGHT SHOULDER SURGERY.  INCREASED SOB.   Do you understand the discharge instructions? YES, MOVE SLOWLY BETWEEN ACTIVITIES.  FOLLOW UP WITH PCP FOR SOB. PHYSICAL THERAPY AS DIRECTED.   Where were you discharged to? Home.   Items Reviewed:  Medications reviewed: YES, STOP TAKING HYDROCODONE-ACETAMINOPHEN 5-325MG .  TAKING ALL SCHEDULED MEDICATIONS AS DIRECTED, NO PROBLEMS.  Allergies reviewed: YES, NO CHANGES.  Dietary changes reviewed: NO SALT .  Referrals reviewed: YES APPOINTMENTS SCHEDULED WITH PCP, PULMONARY AND ORTHO.   Functional Questionnaire:   Activities of Daily Living (ADLs):   She states they are independent in the following: Bathing, dressing, self feeding, toileting. States they require assistance with the following: Ambulating (walker/cane in use), meal prep. Husband assists.   Any transportation issues/concerns?: NO.   Any patient concerns? YES, DISCUSS THE POSSIBILITY OF INCREASED DROP IN OXYGEN TO BE RELATED TO ANXIETY. BLOODY STOOLS WHILE HOSPITALIZED.    Confirmed importance and date/time of follow-up visits scheduled YES, appointment scheduled 11/06/15 at 11:30.  Provider Appointment booked with Dr. Lacinda Axon (PCP).  Confirmed with patient if condition begins to worsen call PCP or go to the ER.  Patient was given the office number and encouraged to call back with question or concerns.  : YES, PATIENT VERBALIZED UNDERSTANDING.

## 2015-11-06 ENCOUNTER — Ambulatory Visit (INDEPENDENT_AMBULATORY_CARE_PROVIDER_SITE_OTHER): Payer: PPO

## 2015-11-06 ENCOUNTER — Encounter: Payer: Self-pay | Admitting: Family Medicine

## 2015-11-06 ENCOUNTER — Ambulatory Visit (INDEPENDENT_AMBULATORY_CARE_PROVIDER_SITE_OTHER): Payer: PPO | Admitting: Family Medicine

## 2015-11-06 VITALS — BP 128/81 | HR 70 | Temp 97.7°F | Wt 256.1 lb

## 2015-11-06 DIAGNOSIS — R0609 Other forms of dyspnea: Secondary | ICD-10-CM

## 2015-11-06 DIAGNOSIS — R0600 Dyspnea, unspecified: Secondary | ICD-10-CM | POA: Insufficient documentation

## 2015-11-06 DIAGNOSIS — Z96611 Presence of right artificial shoulder joint: Secondary | ICD-10-CM | POA: Diagnosis not present

## 2015-11-06 DIAGNOSIS — J189 Pneumonia, unspecified organism: Secondary | ICD-10-CM

## 2015-11-06 DIAGNOSIS — R0602 Shortness of breath: Secondary | ICD-10-CM | POA: Diagnosis not present

## 2015-11-06 LAB — CBC
HCT: 35.1 % — ABNORMAL LOW (ref 36.0–46.0)
Hemoglobin: 11.4 g/dL — ABNORMAL LOW (ref 12.0–15.0)
MCHC: 32.5 g/dL (ref 30.0–36.0)
MCV: 85 fl (ref 78.0–100.0)
Platelets: 396 10*3/uL (ref 150.0–400.0)
RBC: 4.13 Mil/uL (ref 3.87–5.11)
RDW: 16.6 % — AB (ref 11.5–15.5)
WBC: 5.8 10*3/uL (ref 4.0–10.5)

## 2015-11-06 NOTE — Assessment & Plan Note (Signed)
Resolved. Completed antibiotics and prednisone.

## 2015-11-06 NOTE — Assessment & Plan Note (Signed)
New problem (to me). Likely multifactorial in nature - recent pneumonia, hospitalization/deconditioning, morbid obesity, paroxysmal A. fib, underlying asthma. Chest x-ray today. Rechecking CBC. Medications reviewed and reconciled today. Advise compliance. Advised pulmonary follow-up as well.

## 2015-11-06 NOTE — Progress Notes (Signed)
Pre visit review using our clinic review tool, if applicable. No additional management support is needed unless otherwise documented below in the visit note. 

## 2015-11-06 NOTE — Assessment & Plan Note (Signed)
Doing well from Ortho perspective. Has scheduled follow up with Ortho.

## 2015-11-06 NOTE — Progress Notes (Signed)
Subjective:  Patient ID: [name], female    DOB: 11-Jul-1950  Age: 65 y.o. MRN: [id number]  CC: Follow up from Hospitalization/rehabilitation stay, SOB  HPI:  65 year old female with morbid obesity, DM 2 with complications, asthma, sleep apnea, chronic pain, paroxysmal A. fib presents for follow-up from recent hospitalization and post rehabilitation.  Patient was recently admitted from 7/18 to 7/21.  Summary: Admitted for R shoulder replacement. Successful surgery. Hospital course complicated by shortness of breath, hyperkalemia, anemia/+FOBT. Patient's shortness of breath was found to be secondary to pneumonia and A. fib. She was treated with IV antibiotics and transitions to PO Levaquin.  Hyperkalemia was postoperative and resolved with Kayexalate. Hemoglobin remained stable in regards to anemia and positive FOBT (x 1). Pradaxa was restarted.   Following hospitalization she went to SNF for rehab. The patient states she remained on oxygen for quite some time during her rehabilitation stay and this was subsequently discontinued. Patient states that she progressed well with rehabilitation.  Patient presents today for follow-up. Patient states that since her hospitalization she has been experiencing shortness of breath. This has continued following hospitalization and rehabilitation. Patient states that she has been having shortness of breath on exertion. No reports of PND or orthopnea. No reports of fevers or chills. Patient states that it comes and goes. Sometimes occurs with minimal activity and sometimes with more strenuous activity. No known relieving factors other than rest. Patient does have an upcoming pulmonology visit. Patient or his compliance with her medications and has finished her course of antibiotics and prednisone. She has also followed up with her orthopedist.  Social Hx   Social History   Social History  . Marital status: Married    Spouse name: N/A  . Number of  children: N/A  . Years of education: N/A   Social History Main Topics  . Smoking status: Never Smoker  . Smokeless tobacco: Never Used  . Alcohol use No  . Drug use: No  . Sexual activity: No   Other Topics Concern  . None   Social History Narrative   She is currently married -- for 28 years. Does not work. Does not smoke or take alcohol. She never smoked. She exercises at least 3 days a week since before her gastric surgery.   Marital status reviewed in history of present illness.   Review of Systems  Constitutional: Negative.   Respiratory: Positive for shortness of breath.   Cardiovascular: Negative for chest pain.       No PND, orthopnea.    Objective:  BP 128/81 (BP Location: Left Arm, Patient Position: Sitting, Cuff Size: Normal)   Pulse 70   Temp 97.7 F (36.5 C) (Oral)   Wt 256 lb 2 oz (116.2 kg)   SpO2 98%   BMI 42.62 kg/m   BP/Weight 11/06/2015 10/29/2015 [id number]  Systolic BP 0000000 123456 [id number]  Diastolic BP 81 73 82  Wt. (Lbs) 256.13 255.5 -  BMI 42.62 42.52 -   Physical Exam  Constitutional: She is oriented to person, place, and time.  Morbidly obese female in no acute distress.  Cardiovascular: Normal rate and regular rhythm.   Trace LE edema.   Pulmonary/Chest: Effort normal. She has no wheezes. She has no rales.  Musculoskeletal:  Right arm in sling.  Neurological: She is alert and oriented to person, place, and time.  Psychiatric: She has a normal mood and affect.  Vitals reviewed.  Lab Results  Component Value Date   WBC 6.6 10/13/2015  HGB 10.5 (L) 10/13/2015   HCT 31.7 (L) 10/13/2015   PLT 272 10/13/2015   GLUCOSE 170 (H) 10/11/2015   CHOL 173 08/05/2015   TRIG 50 08/05/2015   HDL 97 08/05/2015   LDLCALC 66 08/05/2015   ALT 14 08/05/2015   AST 14 (L) 08/05/2015   NA 137 10/11/2015   K 4.0 10/11/2015   CL 102 10/11/2015   CREATININE 0.60 10/11/2015   BUN 15 10/11/2015   CO2 27 10/11/2015   TSH 1.565 08/05/2015   INR 1.18 09/26/2015    HGBA1C 6.6 (H) 08/05/2015    Assessment & Plan:   Problem List Items Addressed This Visit    CAP (community acquired pneumonia)    Resolved. Completed antibiotics and prednisone.       DOE (dyspnea on exertion)    New problem (to me). Likely multifactorial in nature - recent pneumonia, hospitalization/deconditioning, morbid obesity, paroxysmal A. fib, underlying asthma. Chest x-ray today. Rechecking CBC. Medications reviewed and reconciled today. Advise compliance. Advised pulmonary follow-up as well.      Relevant Orders   CBC   DG Chest 2 View (Completed)   Status post reverse total shoulder replacement - Primary    Doing well from Ortho perspective. Has scheduled follow up with Ortho.       Other Visit Diagnoses   None.    Follow-up: 1-3 months.   [place]

## 2015-11-06 NOTE — Patient Instructions (Signed)
Continue your medications.  Be sure to follow up with Dr. Raul Del.  Follow up with me in 1-3 months.  Take care  Dr. Lacinda Axon

## 2015-11-07 ENCOUNTER — Telehealth: Payer: Self-pay | Admitting: Family Medicine

## 2015-11-07 DIAGNOSIS — R05 Cough: Secondary | ICD-10-CM | POA: Diagnosis not present

## 2015-11-07 DIAGNOSIS — J45998 Other asthma: Secondary | ICD-10-CM | POA: Diagnosis not present

## 2015-11-07 DIAGNOSIS — R0609 Other forms of dyspnea: Secondary | ICD-10-CM | POA: Diagnosis not present

## 2015-11-07 DIAGNOSIS — G4733 Obstructive sleep apnea (adult) (pediatric): Secondary | ICD-10-CM | POA: Diagnosis not present

## 2015-11-07 NOTE — Telephone Encounter (Signed)
Left detailed message stating that results have not been completed yet.

## 2015-11-07 NOTE — Telephone Encounter (Signed)
Pt called to get her lab and chest results from 11/06/15. Pt states it's ok to leave msg.   Please advise?   Call pt @ [phone]. Thank you!

## 2015-11-08 ENCOUNTER — Other Ambulatory Visit: Payer: Self-pay | Admitting: Family Medicine

## 2015-11-12 DIAGNOSIS — Z96611 Presence of right artificial shoulder joint: Secondary | ICD-10-CM | POA: Diagnosis not present

## 2015-11-14 DIAGNOSIS — Z96611 Presence of right artificial shoulder joint: Secondary | ICD-10-CM | POA: Diagnosis not present

## 2015-11-21 DIAGNOSIS — Z96611 Presence of right artificial shoulder joint: Secondary | ICD-10-CM | POA: Diagnosis not present

## 2015-11-22 DIAGNOSIS — Z96611 Presence of right artificial shoulder joint: Secondary | ICD-10-CM | POA: Diagnosis not present

## 2015-11-23 ENCOUNTER — Other Ambulatory Visit: Payer: Self-pay | Admitting: Family Medicine

## 2015-11-26 DIAGNOSIS — E119 Type 2 diabetes mellitus without complications: Secondary | ICD-10-CM | POA: Diagnosis not present

## 2015-11-26 DIAGNOSIS — L97211 Non-pressure chronic ulcer of right calf limited to breakdown of skin: Secondary | ICD-10-CM | POA: Diagnosis not present

## 2015-11-26 DIAGNOSIS — M79609 Pain in unspecified limb: Secondary | ICD-10-CM | POA: Diagnosis not present

## 2015-11-26 DIAGNOSIS — M549 Dorsalgia, unspecified: Secondary | ICD-10-CM | POA: Diagnosis not present

## 2015-11-26 DIAGNOSIS — M7981 Nontraumatic hematoma of soft tissue: Secondary | ICD-10-CM | POA: Diagnosis not present

## 2015-11-26 DIAGNOSIS — I890 Lymphedema, not elsewhere classified: Secondary | ICD-10-CM | POA: Diagnosis not present

## 2015-11-26 DIAGNOSIS — I87091 Postthrombotic syndrome with other complications of right lower extremity: Secondary | ICD-10-CM | POA: Diagnosis not present

## 2015-11-26 DIAGNOSIS — I10 Essential (primary) hypertension: Secondary | ICD-10-CM | POA: Diagnosis not present

## 2015-11-26 DIAGNOSIS — M7989 Other specified soft tissue disorders: Secondary | ICD-10-CM | POA: Diagnosis not present

## 2015-11-27 DIAGNOSIS — Z96611 Presence of right artificial shoulder joint: Secondary | ICD-10-CM | POA: Diagnosis not present

## 2015-12-03 ENCOUNTER — Ambulatory Visit: Payer: Self-pay | Admitting: Urology

## 2015-12-04 DIAGNOSIS — Z96611 Presence of right artificial shoulder joint: Secondary | ICD-10-CM | POA: Diagnosis not present

## 2015-12-05 ENCOUNTER — Ambulatory Visit (INDEPENDENT_AMBULATORY_CARE_PROVIDER_SITE_OTHER): Payer: PPO | Admitting: Urology

## 2015-12-05 ENCOUNTER — Encounter: Payer: Self-pay | Admitting: Urology

## 2015-12-05 VITALS — BP 140/86 | HR 67 | Ht 63.0 in | Wt 252.4 lb

## 2015-12-05 DIAGNOSIS — R32 Unspecified urinary incontinence: Secondary | ICD-10-CM

## 2015-12-05 DIAGNOSIS — D3502 Benign neoplasm of left adrenal gland: Secondary | ICD-10-CM

## 2015-12-05 DIAGNOSIS — N952 Postmenopausal atrophic vaginitis: Secondary | ICD-10-CM | POA: Diagnosis not present

## 2015-12-05 DIAGNOSIS — D3501 Benign neoplasm of right adrenal gland: Secondary | ICD-10-CM | POA: Diagnosis not present

## 2015-12-05 DIAGNOSIS — N3946 Mixed incontinence: Secondary | ICD-10-CM | POA: Diagnosis not present

## 2015-12-05 DIAGNOSIS — D1779 Benign lipomatous neoplasm of other sites: Secondary | ICD-10-CM

## 2015-12-05 LAB — BLADDER SCAN AMB NON-IMAGING: Scan Result: 0

## 2015-12-05 MED ORDER — OXYBUTYNIN CHLORIDE ER 10 MG PO TB24: 10.0000 mg | ORAL_TABLET | Freq: Every day | ORAL | 12 refills | Status: DC

## 2015-12-05 MED ORDER — MIRABEGRON ER 25 MG PO TB24: 25.0000 mg | ORAL_TABLET | Freq: Every day | ORAL | 12 refills | Status: DC

## 2015-12-05 NOTE — Progress Notes (Signed)
12/05/2015 2:39 PM   [name] [birth year] [id number]  Referring provider: [name], DO [address] [place],  [address]  Chief Complaint  Patient presents with  . Urinary Incontinence    1 month follow up  . Vaginal Atrophy    HPI: Patient is a 65 year old Caucasian female with a history of urinary incontinence, history renal stones and bilateral adrenal myolipomas who presents today for 1 year follow-up.  Urinary incontinence Patient has symptoms of stress and urge incontinence. She loses urine if she laughs, coughs or sneezes. She also loses urine if she cannot make it to the bathroom on time when she feels the urge to urinate.  She had been tried on several anticholinergics in the past, such as: Oxybutynin, Gelnique and Enablex.  She is currently on Myrbetriq and findings this medication the most effective in controlling her urinary incontinence.  She would like to continue this medication.  She has also been placed back on her oxybutynin.  She is finding this combination very effective.  Her PVR on today's exam is 0 mL.  She is not experiencing suprapubic pain, gross hematuria or dysuria.  Vaginal atrophy She is using vaginal estrogen cream three nights weekly.   History of kidney stones She has not had any recent flank pain or gross hematuria.  A CT scan performed in 2014 did not note any nephrolithiasis.   Bilateral adrenal myolipomas MRI performed on 12/14/2014 noted bilateral adrenal masses containing areas of macroscopic fat are compatible with benign adrenal myelolipoma. When compared with study from 08/23/2012 these demonstrate only minimal increase in size and are compatible with a benign process.  No further monitoring is needed.       PMH: Past Medical History:  Diagnosis Date  . Adrenal mass ([place])   . Anxiety   . Anxiety and depression   . Arthritis   . Asthma   . Bronchitis   . Cystocele   . Depression   . Diabetes ([place])   .  DVT (deep venous thrombosis) ([facility])    Righ calf  . Dysthymic disorder   . GERD (gastroesophageal reflux disease)   . Heart murmur    Echocardiogram June 2015: Mild MR (possible vegetation seen on TTE, not seen on TEE), normal LV size with moderate concentric LVH. Normal function EF 60-65%. Normal diastolic function. Mild LA dilation.  [place] Kitchen History of IBS   . Hypothyroidism   . Insomnia   . Kidney disease   . Kidney stone   . Neuropathy involving both lower extremities ([place])   . Obesity, Class II, BMI 35-39.9, with comorbidity ([place])   . Paroxysmal atrial fibrillation ([place]) 2007   In June 2015, Cardiac Event Monitor: Mostly SR/sinus arrhythmia with PVCs that are frequent. Short bursts of A. fib lasting several minutes;; CHA2DS2-VASc Score = 5 (age, Female, PVD, DM, HTN)  . Peripheral vascular disease ([place])   . Sleep apnea    use C-PAP  . Thromboembolic deterrent (TED) stockings in place on both lower extremities   . Venous stasis dermatitis of both lower extremities     Surgical History: Past Surgical History:  Procedure Laterality Date  . ANKLE SURGERY Right   . APPLICATION OF WOUND VAC Left 06/27/2015   Procedure: APPLICATION OF WOUND VAC ( POSSIBLE ) ;  Surgeon: [name], MD;  Location: [facility] ORS;  Service: Vascular;  Laterality: Left;  . CHOLECYSTECTOMY    . HERNIA REPAIR    . HIATAL HERNIA REPAIR    .  I&D EXTREMITY Left 06/27/2015   Procedure: IRRIGATION AND DEBRIDEMENT EXTREMITY            ( CALF HEMATOMA ) POSSIBLE WOUND VAC;  Surgeon: [name], MD;  Location: [facility] ORS;  Service: Vascular;  Laterality: Left;  . JOINT REPLACEMENT     left knee replacement  . KIDNEY SURGERY    . LITHOTRIPSY    . NM [facility] HX)  February 2017   Likely breast attenuation. LOW RISK study. Normal EF 55-60%.  . removal of left hematoma    . REVERSE SHOULDER ARTHROPLASTY Right 10/08/2015   Procedure: REVERSE SHOULDER ARTHROPLASTY;  Surgeon: [name], MD;  Location: [facility] ORS;   Service: Orthopedics;  Laterality: Right;  . SLEEVE GASTROPLASTY    . TONSILLECTOMY    . TOTAL KNEE ARTHROPLASTY    . TRANSESOPHAGEAL ECHOCARDIOGRAM  08/08/2013   Mild LVH, EF 60-65%. Moderate LA dilation and mild RA dilation. Mild MR with no evidence of stenosis and no evidence of endocarditis. A false chordae is noted.  . TRANSTHORACIC ECHOCARDIOGRAM  08/03/2013   Mild-Moderate concentric LVH, EF 60-65%. Normal diastolic function. Mild LA dilation. Mild MR with possible vegetation  - not confirmed on TEE   . VAGINAL HYSTERECTOMY      Home Medications:    Medication List       Accurate as of 12/05/15  2:39 PM. Always use your most recent med list.          acetaminophen 325 MG tablet Commonly known as:  TYLENOL Take by mouth.   albuterol (2.5 MG/3ML) 0.083% nebulizer solution Commonly known as:  PROVENTIL Take 3 mLs (2.5 mg total) by nebulization every 6 (six) hours as needed for wheezing or shortness of breath.   albuterol 108 (90 Base) MCG/ACT inhaler Commonly known as:  PROVENTIL HFA;VENTOLIN HFA Inhale 1-2 puffs into the lungs every 4 (four) hours as needed for wheezing or shortness of breath.   ALPRAZolam 0.5 MG tablet Commonly known as:  XANAX TAKE ONE TABLET TWICE DAILY AS NEEDED   buPROPion 300 MG 24 hr tablet Commonly known as:  WELLBUTRIN XL TAKE ONE TABLET BY MOUTH EVERY DAY   dabigatran 150 MG Caps capsule Commonly known as:  PRADAXA Take 150 mg by mouth 2 (two) times daily.   DULoxetine 60 MG capsule Commonly known as:  CYMBALTA Take 1 capsule (60 mg total) by mouth daily.   ESTRACE VAGINAL 0.1 MG/GM vaginal cream Generic drug:  estradiol INSERT 1 GRAM VAGINALLY TWICE A WEEK   flecainide 100 MG tablet Commonly known as:  TAMBOCOR Take 2 tablets (200 mg total) by mouth as needed (for afib. May take additional 100mg  flecainide six hours later if symptoms persist.).   fluticasone-salmeterol 115-21 MCG/ACT inhaler Commonly known as:  ADVAIR  HFA Inhale 2 puffs into the lungs 2 (two) times daily.   gabapentin 800 MG tablet Commonly known as:  NEURONTIN Take 1 tablet (800 mg total) by mouth every 6 (six) hours.   levothyroxine 150 MCG tablet Commonly known as:  SYNTHROID, LEVOTHROID Take 1 tablet (150 mcg total) by mouth every morning.   Melatonin 10 MG Caps Take 20 mg by mouth at bedtime as needed.   meloxicam 15 MG tablet Commonly known as:  MOBIC Take by mouth.   metFORMIN 1000 MG tablet Commonly known as:  GLUCOPHAGE One pill by mouth every morning about 15 minutes before morning meal, and two pills 15 minutes before evening meal   metoprolol tartrate 25  MG tablet Commonly known as:  LOPRESSOR Take 1 tablet (25 mg total) by mouth 2 (two) times daily.   mirabegron ER 25 MG Tb24 tablet Commonly known as:  MYRBETRIQ Take 1 tablet (25 mg total) by mouth daily.   montelukast 10 MG tablet Commonly known as:  SINGULAIR Take 1 tablet (10 mg total) by mouth daily.   oxybutynin 10 MG 24 hr tablet Commonly known as:  DITROPAN-XL Take 1 tablet (10 mg total) by mouth daily.   oxyCODONE 5 MG immediate release tablet Commonly known as:  Oxy IR/ROXICODONE Take 1-2 tablets (5-10 mg total) by mouth every 4 (four) hours as needed for breakthrough pain.   pantoprazole 40 MG tablet Commonly known as:  PROTONIX TAKE ONE TABLET BY MOUTH TWICE DAILY AS NEEDED. CAUTION: PROLONGED USE MAY INCREASE RISKOF PNEUMONIA,COLITIS,ANEMIA,OSTEOPOROSIS   potassium chloride 10 MEQ tablet Commonly known as:  K-DUR,KLOR-CON Take by mouth.   quinapril 20 MG tablet Commonly known as:  ACCUPRIL Take 2 tablets (40 mg total) by mouth daily.   zolpidem 12.5 MG CR tablet Commonly known as:  AMBIEN CR Take by mouth.       Allergies:  Allergies  Allergen Reactions  . Aspirin Hives  . Penicillins Hives  . Terramycin [Oxytetracycline] Hives    Family History: Family History  Problem Relation Age of Onset  . Skin cancer Father   .  Diabetes Father   . Hypertension Father   . Peripheral vascular disease Father   . Cancer Father   . Cerebral aneurysm Father   . Alcohol abuse Father   . Varicose Veins Mother   . Kidney disease Mother   . Arthritis Mother   . Mental illness Sister   . Cancer Maternal Aunt     breast  . Breast cancer Maternal Aunt   . Arthritis Maternal Grandmother   . Hypertension Maternal Grandmother   . Diabetes Maternal Grandmother   . Arthritis Maternal Grandfather   . Heart disease Maternal Grandfather   . Hypertension Maternal Grandfather   . Arthritis Paternal Grandmother   . Hypertension Paternal Grandmother   . Diabetes Paternal Grandmother   . Arthritis Paternal Grandfather   . Hypertension Paternal Grandfather     Social History:  reports that she has never smoked. She has never used smokeless tobacco. She reports that she does not drink alcohol or use drugs.  ROS: UROLOGY Frequent Urination?: No Hard to postpone urination?: Yes Burning/pain with urination?: No Get up at night to urinate?: No Leakage of urine?: Yes Urine stream starts and stops?: No Trouble starting stream?: No Do you have to strain to urinate?: No Blood in urine?: No Urinary tract infection?: No Sexually transmitted disease?: No Injury to kidneys or bladder?: No Painful intercourse?: No Weak stream?: No Currently pregnant?: No Vaginal bleeding?: No Last menstrual period?: n  Gastrointestinal Nausea?: No Vomiting?: No Indigestion/heartburn?: No Diarrhea?: Yes Constipation?: No  Constitutional Fever: No Night sweats?: No Weight loss?: No Fatigue?: Yes  Skin Skin rash/lesions?: No Itching?: Yes  Eyes Blurred vision?: No Double vision?: No  Ears/Nose/Throat Sore throat?: No Sinus problems?: No  Hematologic/Lymphatic Swollen glands?: No Easy bruising?: Yes  Cardiovascular Leg swelling?: Yes Chest pain?: No  Respiratory Cough?: No Shortness of breath?:  Yes  Endocrine Excessive thirst?: No  Musculoskeletal Back pain?: Yes Joint pain?: Yes  Neurological Headaches?: No Dizziness?: No  Psychologic Depression?: Yes Anxiety?: Yes  Physical Exam: BP 140/86   Pulse 67   Ht 5\' 3"  (1.6 m)  Wt 252 lb 6.4 oz (114.5 kg)   BMI 44.71 kg/m   Constitutional: Well nourished. Alert and oriented, No acute distress. HEENT: [place] AT, moist mucus membranes. Trachea midline, no masses. Cardiovascular: No clubbing, cyanosis, or edema. Respiratory: Normal respiratory effort, no increased work of breathing. Skin: No rashes, bruises or suspicious lesions. Lymph: No cervical or inguinal adenopathy. Neurologic: Grossly intact, no focal deficits, moving all 4 extremities. Psychiatric: Normal mood and affect.   Laboratory Data: Results for orders placed or performed in visit on 12/05/15  BLADDER SCAN AMB NON-IMAGING  Result Value Ref Range   Scan Result 0    Lab Results  Component Value Date   CREATININE 0.79 10/10/2014   Lab Results  Component Value Date   HGBA1C 6.6* 10/10/2014    Assessment & Plan:    1. Mixed urinary incontinence  - BLADDER SCAN AMB NON-IMAGING  - continue Myrbetriq  25 mg daily, refills given  - continue oxybutynin XL 10 mg daily, refills given  2. Atrophic vaginitis  - continue vaginal estrogen cream three nights weekly  3. Bilateral adrenal myelolipomas  - found to benign on MR  - no further follow up needed   Zara Council, PA-C  [facility] [address] [place], [place] [address] [phone]

## 2015-12-06 DIAGNOSIS — Z96611 Presence of right artificial shoulder joint: Secondary | ICD-10-CM | POA: Diagnosis not present

## 2015-12-12 DIAGNOSIS — Z96611 Presence of right artificial shoulder joint: Secondary | ICD-10-CM | POA: Diagnosis not present

## 2015-12-16 DIAGNOSIS — Z96611 Presence of right artificial shoulder joint: Secondary | ICD-10-CM | POA: Diagnosis not present

## 2015-12-18 ENCOUNTER — Encounter: Payer: Self-pay | Admitting: Pain Medicine

## 2015-12-18 ENCOUNTER — Ambulatory Visit: Payer: PPO | Attending: Pain Medicine | Admitting: Pain Medicine

## 2015-12-18 ENCOUNTER — Ambulatory Visit: Payer: PPO | Attending: Specialist

## 2015-12-18 VITALS — BP 160/60 | HR 78 | Temp 97.7°F | Resp 16 | Ht 64.0 in | Wt 252.0 lb

## 2015-12-18 DIAGNOSIS — M25511 Pain in right shoulder: Secondary | ICD-10-CM | POA: Insufficient documentation

## 2015-12-18 DIAGNOSIS — Z7984 Long term (current) use of oral hypoglycemic drugs: Secondary | ICD-10-CM | POA: Insufficient documentation

## 2015-12-18 DIAGNOSIS — F1190 Opioid use, unspecified, uncomplicated: Secondary | ICD-10-CM | POA: Diagnosis not present

## 2015-12-18 DIAGNOSIS — Z880 Allergy status to penicillin: Secondary | ICD-10-CM | POA: Insufficient documentation

## 2015-12-18 DIAGNOSIS — Z7901 Long term (current) use of anticoagulants: Secondary | ICD-10-CM | POA: Insufficient documentation

## 2015-12-18 DIAGNOSIS — J4540 Moderate persistent asthma, uncomplicated: Secondary | ICD-10-CM | POA: Diagnosis not present

## 2015-12-18 DIAGNOSIS — G4733 Obstructive sleep apnea (adult) (pediatric): Secondary | ICD-10-CM | POA: Diagnosis not present

## 2015-12-18 DIAGNOSIS — Z886 Allergy status to analgesic agent status: Secondary | ICD-10-CM | POA: Diagnosis not present

## 2015-12-18 DIAGNOSIS — M25512 Pain in left shoulder: Secondary | ICD-10-CM | POA: Diagnosis not present

## 2015-12-18 DIAGNOSIS — F418 Other specified anxiety disorders: Secondary | ICD-10-CM | POA: Insufficient documentation

## 2015-12-18 DIAGNOSIS — G4701 Insomnia due to medical condition: Secondary | ICD-10-CM | POA: Diagnosis not present

## 2015-12-18 DIAGNOSIS — E1142 Type 2 diabetes mellitus with diabetic polyneuropathy: Secondary | ICD-10-CM | POA: Diagnosis not present

## 2015-12-18 DIAGNOSIS — Z79899 Other long term (current) drug therapy: Secondary | ICD-10-CM | POA: Insufficient documentation

## 2015-12-18 DIAGNOSIS — M79673 Pain in unspecified foot: Secondary | ICD-10-CM | POA: Insufficient documentation

## 2015-12-18 DIAGNOSIS — E1151 Type 2 diabetes mellitus with diabetic peripheral angiopathy without gangrene: Secondary | ICD-10-CM | POA: Diagnosis not present

## 2015-12-18 DIAGNOSIS — K219 Gastro-esophageal reflux disease without esophagitis: Secondary | ICD-10-CM | POA: Diagnosis not present

## 2015-12-18 DIAGNOSIS — I10 Essential (primary) hypertension: Secondary | ICD-10-CM | POA: Diagnosis not present

## 2015-12-18 DIAGNOSIS — I480 Paroxysmal atrial fibrillation: Secondary | ICD-10-CM | POA: Insufficient documentation

## 2015-12-18 DIAGNOSIS — Z6841 Body Mass Index (BMI) 40.0 and over, adult: Secondary | ICD-10-CM | POA: Diagnosis not present

## 2015-12-18 DIAGNOSIS — G8929 Other chronic pain: Secondary | ICD-10-CM | POA: Diagnosis not present

## 2015-12-18 MED ORDER — GABAPENTIN 800 MG PO TABS: 800.0000 mg | ORAL_TABLET | Freq: Four times a day (QID) | ORAL | 2 refills | Status: DC

## 2015-12-18 MED ORDER — DULOXETINE HCL 60 MG PO CPEP: 60.0000 mg | ORAL_CAPSULE | Freq: Every day | ORAL | 2 refills | Status: DC

## 2015-12-18 MED ORDER — HYDROCODONE-ACETAMINOPHEN 5-325 MG PO TABS: 1.0000 | ORAL_TABLET | Freq: Three times a day (TID) | ORAL | 0 refills | Status: DC | PRN

## 2015-12-18 MED ORDER — MELATONIN 10 MG PO CAPS: 20.0000 mg | ORAL_CAPSULE | Freq: Every evening | ORAL | 2 refills | Status: DC | PRN

## 2015-12-18 NOTE — Patient Instructions (Signed)
Pain Management Discharge Instructions  General Discharge Instructions :  If you need to reach your doctor call: Monday-Friday 8:00 am - 4:00 pm at [phone] or toll free [phone].  After clinic hours [phone] to have operator reach doctor.  Bring all of your medication bottles to all your appointments in the pain clinic.  To cancel or reschedule your appointment with Pain Management please remember to call 24 hours in advance to avoid a fee.  Refer to the educational materials which you have been given on: General Risks, I had my Procedure. Discharge Instructions, Post Sedation.  Post Procedure Instructions:  The drugs you were given will stay in your system until tomorrow, so for the next 24 hours you should not drive, make any legal decisions or drink any alcoholic beverages.  You may eat anything you prefer, but it is better to start with liquids then soups and crackers, and gradually work up to solid foods.  Please notify your doctor immediately if you have any unusual bleeding, trouble breathing or pain that is not related to your normal pain.  Depending on the type of procedure that was done, some parts of your body may feel week and/or numb.  This usually clears up by tonight or the next day.  Walk with the use of an assistive device or accompanied by an adult for the 24 hours.  You may use ice on the affected area for the first 24 hours.  Put ice in a Ziploc bag and cover with a towel and place against area 15 minutes on 15 minutes off.  You may switch to heat after 24 hours. 

## 2015-12-18 NOTE — Progress Notes (Signed)
Patient here for medication management.  Patient states that she is s/p shoulder surgery and was prescribed oxycodone 5 mg to be taken 1 -2 tablets for pain.  Patient does not have bottle states she was prescribed 30 tablets and still has 22 tablets.  She also states she would like to discuss taking Ambien as you asked her not to take this any longer.   Safety precautions to be maintained throughout the outpatient stay will include: orient to surroundings, keep bed in low position, maintain call bell within reach at all times, provide assistance with transfer out of bed and ambulation.

## 2015-12-18 NOTE — Progress Notes (Signed)
Patient's Name: [name]  MRN: [id number]  Referring Provider: [name], DO  DOB: 1950-09-21  PCP: [name], DO  DOS: 12/18/2015  Note by: Kathlen Brunswick. Dossie Arbour, MD  Service setting: Ambulatory outpatient  Specialty: Interventional Pain Management  Location: [facility] (AMB) Pain Management Facility    Patient type: Established   Primary Reason(s) for Visit: Encounter for prescription drug management (Level of risk: moderate) CC: Foot Pain (bilateral neuropathy) and Shoulder Pain (s/p shoulder surgery)  HPI  Ms. Biddle is a 64 y.o. year old, female patient, who comes today for an initial evaluation. She has Paroxysmal atrial fibrillation ([place]); CHA2DS2-Vasc Score 5. On Pradaxa; Hypertension goal BP (blood pressure) < 150/90; Peripheral vascular disease due to secondary diabetes mellitus ([place]); Apnea, sleep; DM (diabetes mellitus) type II controlled peripheral vascular disorder ([place]); Diabetic peripheral neuropathy associated with type 2 diabetes mellitus ([place]); Asthma, moderate persistent, well-controlled; Hypothyroidism, adult; Anxiety and depression; Bladder cystocele; Gastro-esophageal reflux disease without esophagitis; H/O deep venous thrombosis; Major depression, recurrent, chronic ([place]); Mixed incontinence; Atrophic vaginitis; Myelolipoma of right adrenal gland; Chronic pain; Opiate use (15 MME/Day); Lateral femoral cutaneous entrapment syndrome (Right); Other shoulder lesions (Right); Neuropathy of right lateral femoral cutaneous nerve; Morbid obesity with BMI of 40.0-44.9, adult ([place]); Chronic prescription benzodiazepine use; Osteoarthritis, multiple sites; Status post reverse total shoulder replacement; CAP (community acquired pneumonia); and DOE (dyspnea on exertion) on her problem list.. Her primarily concern today is the Foot Pain (bilateral neuropathy) and Shoulder Pain (s/p shoulder surgery)  Pain Assessment: Self-Reported Pain Score: 3 /10             Reported level is  compatible with observation.       Pain Type: Chronic pain Pain Location: Foot Pain Orientation: Right, Left Pain Descriptors / Indicators: Burning (prickly, stinging pain) Pain Frequency: Intermittent  The patient comes into the clinics today for pharmacological management of her chronic pain. I last saw this patient on 09/17/2015. The patient  reports that she does not use drugs. Her body mass index is 43.26 kg/m. Today the patient requested an increase in her pain medications indicating that she has been taking 2 at a time. This increase was denied. The patient did get information regarding "[facility] ".  Date of Last Visit: 09/17/15 Service Provided on Last Visit: Med Refill  Controlled Substance Pharmacotherapy Assessment & REMS (Risk Evaluation and Mitigation Strategy)  Analgesic: Hydrocodone/APAP 5/325 one tablet every 8 hours (15 mg/day) MME/day: 15 mg/day Pill Count: The patient did not bring her pills or empty bottles. Patient states that she is s/p shoulder surgery and was prescribed oxycodone 5 mg to be taken 1 -2 tablets for pain.  Patient does not have bottle states she was prescribed 30 tablets and still has 22 tablets.Patient informed of the need to bring in them for pill count and also informed that this is a requirement. The patient was given a final warning with regards to this and informed that should she did not bring them in, we will not do refills.  Pharmacokinetics: Onset of action (Liberation/Absorption): Within expected pharmacological parameters Time to Peak effect (Distribution): Timing and results are as within normal expected parameters Duration of action (Metabolism/Excretion): Within normal limits for medication Pharmacodynamics: Analgesic Effect: More than 50% Activity Facilitation: Medication(s) allow patient to sit, stand, walk, and do the basic ADLs Perceived Effectiveness: Described as relatively effective, allowing for increase in activities of daily  living (ADL) Side-effects or Adverse reactions: None reported Monitoring: [place] PMP: Online  review of the past 34-month period conducted. Compliant with practice rules and regulations List of all UDS test(s) done:  Lab Results  Component Value Date   TOXASSSELUR FINAL 09/17/2015   TOXASSSELUR FINAL 07/12/2015   TOXASSSELUR FINAL 04/17/2015   [place] FINAL 01/16/2015   Last UDS on record: ToxAssure Select 13  Date Value Ref Range Status  09/17/2015 FINAL  Final    Comment:    ==================================================================== TOXASSURE SELECT 13 (MW) ==================================================================== Test                             Result       Flag       Units Drug Absent but Declared for Prescription Verification   Alprazolam                     Not Detected UNEXPECTED ng/mg creat   Hydrocodone                    Not Detected UNEXPECTED ng/mg creat ==================================================================== Test                      Result    Flag   Units      Ref Range   Creatinine              116              mg/dL      >=20 ==================================================================== Declared Medications:  The flagging and interpretation on this report are based on the  following declared medications.  Unexpected results may arise from  inaccuracies in the declared medications.  **Note: The testing scope of this panel includes these medications:  Alprazolam (Xanax)  Hydrocodone (Hydrocodone-Acetaminophen)  **Note: The testing scope of this panel does not include following  reported medications:  Acetaminophen (Hydrocodone-Acetaminophen)  Albuterol  Albuterol (Proventil)  Bupropion (Wellbutrin)  Duloxetine (Cymbalta)  Estradiol  Flecainide (Tambocor)  Gabapentin  Levothyroxine  Melatonin  Metformin  Metoprolol  Mirabegron (Myrbetriq)  Montelukast (Singulair)  Pantoprazole (Protonix)   Quinapril ==================================================================== For clinical consultation, please call [phone]. ====================================================================    UDS interpretation: Compliant          Medication Assessment Form: Reviewed. Patient indicates being compliant with therapy Treatment compliance: Compliant Risk Assessment: Aberrant Behavior: None observed today Substance Use Disorder (SUD) Risk Level: No change since last visit Risk of opioid abuse or dependence: 0.7-3.0% with doses ? 36 MME/day and 6.1-26% with doses ? 120 MME/day. Opioid Risk Tool (ORT) Score: 4   Moderate Risk for SUD (Score between 4-7) Depression Scale Score: PHQ-2: 0   No depression (0) PHQ-9: 0   No depression (0-4)  Pharmacologic Plan: No change in therapy, at this time  Laboratory Chemistry  Inflammation Markers Lab Results  Component Value Date   ESRSEDRATE 12 04/17/2015   CRP 0.6 04/17/2015   Renal Function Lab Results  Component Value Date   BUN 15 10/11/2015   CREATININE 0.60 10/11/2015   GFRAA >60 10/11/2015   GFRNONAA >60 10/11/2015   Hepatic Function Lab Results  Component Value Date   AST 14 (L) 08/05/2015   ALT 14 08/05/2015   ALBUMIN 3.8 08/05/2015   Electrolytes Lab Results  Component Value Date   NA 137 10/11/2015   K 4.0 10/11/2015   CL 102 10/11/2015   CALCIUM 8.4 (L) 10/11/2015   MG 1.8 04/17/2015   Pain Modulating Vitamins No results  found for: Marveen Reeks [id number], [id number], Almyra Free, [id number], [id number] Coagulation Parameters Lab Results  Component Value Date   INR 1.18 09/26/2015   LABPROT 15.2 (H) 09/26/2015   APTT 42 (H) 06/24/2015   PLT 396.0 11/06/2015   Cardiovascular Lab Results  Component Value Date   BNP 157.0 (H) 10/09/2015   HGB 11.4 (L) 11/06/2015   HCT 35.1 (L) 11/06/2015   Note: Lab results reviewed.  Recent Diagnostic Imaging  No results found. Meds  The  patient has a current medication list which includes the following prescription(s): acetaminophen, albuterol, albuterol, alprazolam, bupropion, dabigatran, duloxetine, estrace vaginal, fluticasone-salmeterol, gabapentin, levothyroxine, melatonin, meloxicam, metformin, metoprolol tartrate, mirabegron er, montelukast, oxybutynin, pantoprazole, quinapril, hydrocodone-acetaminophen, hydrocodone-acetaminophen, and hydrocodone-acetaminophen.  Current Outpatient Prescriptions on File Prior to Visit  Medication Sig  . acetaminophen (TYLENOL) 325 MG tablet Take 650 mg by mouth 2 (two) times daily.   Marland Kitchen albuterol (PROVENTIL HFA;VENTOLIN HFA) 108 (90 Base) MCG/ACT inhaler Inhale 1-2 puffs into the lungs every 4 (four) hours as needed for wheezing or shortness of breath.  Marland Kitchen albuterol (PROVENTIL) (2.5 MG/3ML) 0.083% nebulizer solution Take 3 mLs (2.5 mg total) by nebulization every 6 (six) hours as needed for wheezing or shortness of breath.  . ALPRAZolam (XANAX) 0.5 MG tablet TAKE ONE TABLET TWICE DAILY AS NEEDED  . buPROPion (WELLBUTRIN XL) 300 MG 24 hr tablet TAKE ONE TABLET BY MOUTH EVERY DAY  . dabigatran (PRADAXA) 150 MG CAPS capsule Take 150 mg by mouth 2 (two) times daily.  Marland Kitchen ESTRACE VAGINAL 0.1 MG/GM vaginal cream INSERT 1 GRAM VAGINALLY TWICE A WEEK  . fluticasone-salmeterol (ADVAIR HFA) 115-21 MCG/ACT inhaler Inhale 2 puffs into the lungs 2 (two) times daily.  Marland Kitchen levothyroxine (SYNTHROID, LEVOTHROID) 150 MCG tablet Take 1 tablet (150 mcg total) by mouth every morning.  . meloxicam (MOBIC) 15 MG tablet Take 15 mg by mouth daily.   . metFORMIN (GLUCOPHAGE) 1000 MG tablet One pill by mouth every morning about 15 minutes before morning meal, and two pills 15 minutes before evening meal (Patient taking differently: Take 1,000 mg by mouth daily with breakfast. 500 mg Metformin daily  before supper)  . metoprolol tartrate (LOPRESSOR) 25 MG tablet Take 1 tablet (25 mg total) by mouth 2 (two) times daily.  .  mirabegron ER (MYRBETRIQ) 25 MG TB24 tablet Take 1 tablet (25 mg total) by mouth daily.  . montelukast (SINGULAIR) 10 MG tablet Take 1 tablet (10 mg total) by mouth daily. (Patient taking differently: Take 10 mg by mouth at bedtime. )  . oxybutynin (DITROPAN-XL) 10 MG 24 hr tablet Take 1 tablet (10 mg total) by mouth daily.  . pantoprazole (PROTONIX) 40 MG tablet TAKE ONE TABLET BY MOUTH TWICE DAILY AS NEEDED. CAUTION: PROLONGED USE MAY INCREASE RISKOF PNEUMONIA,COLITIS,ANEMIA,OSTEOPOROSIS  . quinapril (ACCUPRIL) 20 MG tablet Take 2 tablets (40 mg total) by mouth daily.   No current facility-administered medications on file prior to visit.    ROS  Constitutional: Denies any fever or chills Gastrointestinal: No reported hemesis, hematochezia, vomiting, or acute GI distress Musculoskeletal: Denies any acute onset joint swelling, redness, loss of ROM, or weakness Neurological: No reported episodes of acute onset apraxia, aphasia, dysarthria, agnosia, amnesia, paralysis, loss of coordination, or loss of consciousness  Allergies  Ms. Melley is allergic to aspirin; penicillins; and terramycin [oxytetracycline].  [place]  Medical:  Ms. Mahdavi  has a past medical history of Adrenal mass ([place]); Anxiety; Anxiety and depression; Arthritis; Asthma; Bronchitis; Cystocele; Depression; Diabetes ([place]); DVT (deep venous  thrombosis) ([place]); Dysthymic disorder; GERD (gastroesophageal reflux disease); Heart murmur; History of IBS; Hypothyroidism; Insomnia; Kidney disease; Kidney stone; Neuropathy involving both lower extremities ([place]); Obesity, Class II, BMI 35-39.9, with comorbidity ([place]); Paroxysmal atrial fibrillation ([place]) (2007); Peripheral vascular disease ([place]); Sleep apnea; Thromboembolic deterrent (TED) stockings in place on both lower extremities; and Venous stasis dermatitis of both lower extremities. Family: family history includes Alcohol abuse in her father; Arthritis in her maternal grandfather,  maternal grandmother, mother, paternal grandfather, and paternal grandmother; Breast cancer in her maternal aunt; Cancer in her father and maternal aunt; Cerebral aneurysm in her father; Diabetes in her father, maternal grandmother, and paternal grandmother; Heart disease in her maternal grandfather; Hypertension in her father, maternal grandfather, maternal grandmother, paternal grandfather, and paternal grandmother; Kidney disease in her mother; Mental illness in her sister; Peripheral vascular disease in her father; Skin cancer in her father; Varicose Veins in her mother. Surgical:  has a past surgical history that includes Hernia repair; Ankle surgery (Right); Vaginal hysterectomy; Sleeve Gastroplasty; Lithotripsy; Kidney surgery; transthoracic echocardiogram (08/03/2013); transesophageal echocardiogram (08/08/2013); Tonsillectomy; Total knee arthroplasty; Hiatal hernia repair; Joint replacement; Cholecystectomy; I&D extremity (Left, 06/27/2015); Application if wound vac (Left, 06/27/2015); removal of left hematoma; NM GATED MYOVIEW ([facility] HX) (February 2017); and Reverse shoulder arthroplasty (Right, 10/08/2015). Tobacco:  reports that she has never smoked. She has never used smokeless tobacco. Alcohol:  reports that she does not drink alcohol. Drug:  reports that she does not use drugs.  Constitutional Exam  General appearance: Well nourished, well developed, and well hydrated. In no acute distress Vitals:   12/18/15 1130  BP: (!) 160/60  Pulse: 78  Resp: 16  Temp: 97.7 F (36.5 C)  TempSrc: Oral  SpO2: 91%  Weight: 252 lb (114.3 kg)  Height: 5\' 4"  (1.626 m)  BMI Assessment: Estimated body mass index is 43.26 kg/m as calculated from the following:   Height as of this encounter: 5\' 4"  (1.626 m).   Weight as of this encounter: 252 lb (114.3 kg).   BMI interpretation: (>40 kg/m2) = Extreme obesity (Class III): This range is associated with a 254% higher incidence of chronic pain. BMI Readings  from Last 4 Encounters:  12/18/15 43.26 kg/m  12/05/15 44.71 kg/m  11/06/15 42.62 kg/m  10/29/15 42.52 kg/m   Wt Readings from Last 4 Encounters:  12/18/15 252 lb (114.3 kg)  12/05/15 252 lb 6.4 oz (114.5 kg)  11/06/15 256 lb 2 oz (116.2 kg)  10/29/15 255 lb 8 oz (115.9 kg)  Psych/Mental status: Alert and oriented x 3 (person, place, & time) Eyes: PERLA Respiratory: No evidence of acute respiratory distress  Cervical Spine Exam  Inspection: No masses, redness, or swelling Alignment: Symmetrical Functional ROM: Unrestricted ROM Stability: No instability detected Muscle strength & Tone: Functionally intact Sensory: Unimpaired Palpation: Non-contributory  Upper Extremity (UE) Exam    Side: Right upper extremity  Side: Left upper extremity  Inspection: No masses, redness, swelling, or asymmetry  Inspection: No masses, redness, swelling, or asymmetry  Functional ROM: Unrestricted ROM         Functional ROM: Unrestricted ROM          Muscle strength & Tone: Functionally intact  Muscle strength & Tone: Functionally intact  Sensory: Unimpaired  Sensory: Unimpaired  Palpation: Non-contributory  Palpation: Non-contributory   Thoracic Spine Exam  Inspection: No masses, redness, or swelling Alignment: Symmetrical Functional ROM: Unrestricted ROM Stability: No instability detected Sensory: Unimpaired Muscle strength & Tone: Functionally intact Palpation: Non-contributory  Lumbar  Spine Exam  Inspection: No masses, redness, or swelling Alignment: Symmetrical Functional ROM: Unrestricted ROM Stability: No instability detected Muscle strength & Tone: Functionally intact Sensory: Unimpaired Palpation: Non-contributory Provocative Tests: Lumbar Hyperextension and rotation test: evaluation deferred today       Patrick's Maneuver: evaluation deferred today              Gait & Posture Assessment  Ambulation: Unassisted Gait: Relatively normal for age and body habitus Posture:  WNL   Lower Extremity Exam    Side: Right lower extremity  Side: Left lower extremity  Inspection: No masses, redness, swelling, or asymmetry  Inspection: No masses, redness, swelling, or asymmetry  Functional ROM: Unrestricted ROM          Functional ROM: Unrestricted ROM          Muscle strength & Tone: Functionally intact  Muscle strength & Tone: Functionally intact  Sensory: Unimpaired  Sensory: Unimpaired  Palpation: Non-contributory  Palpation: Non-contributory   Assessment  Primary Diagnosis & Pertinent Problem List: The primary encounter diagnosis was Chronic pain. Diagnoses of Opiate use (15 MME/Day), Diabetic peripheral neuropathy associated with type 2 diabetes mellitus ([place]), Diabetic peripheral neuropathy ([place]), Depression with anxiety, and Insomnia secondary to chronic pain were also pertinent to this visit.  Visit Diagnosis: 1. Chronic pain   2. Opiate use (15 MME/Day)   3. Diabetic peripheral neuropathy associated with type 2 diabetes mellitus ([place])   4. Diabetic peripheral neuropathy ([place])   5. Depression with anxiety   6. Insomnia secondary to chronic pain    Plan of Care  Pharmacotherapy (Medications Ordered): Meds ordered this encounter  Medications  . gabapentin (NEURONTIN) 800 MG tablet    Sig: Take 1 tablet (800 mg total) by mouth every 6 (six) hours.    Dispense:  120 tablet    Refill:  2    Do not place this medication, or any other prescription from our practice, on "Automatic Refill". Patient may have prescription filled one day early if pharmacy is closed on scheduled refill date.  . DULoxetine (CYMBALTA) 60 MG capsule    Sig: Take 1 capsule (60 mg total) by mouth daily.    Dispense:  30 capsule    Refill:  2    Do not place this medication, or any other prescription from our practice, on "Automatic Refill". Patient may have prescription filled one day early if pharmacy is closed on scheduled refill date.  . Melatonin 10 MG CAPS    Sig: Take 20 mg by  mouth at bedtime as needed.    Dispense:  60 capsule    Refill:  2    Do not place this medication, or any other prescription from our practice, on "Automatic Refill". Patient may have prescription filled one day early if pharmacy is closed on scheduled refill date.  Marland Kitchen HYDROcodone-acetaminophen (NORCO/VICODIN) 5-325 MG tablet    Sig: Take 1 tablet by mouth every 8 (eight) hours as needed for severe pain.    Dispense:  90 tablet    Refill:  0    Do not add this medication to the electronic "Automatic Refill" notification system. Patient may have prescription filled one day early if pharmacy is closed on scheduled refill date. Do not fill until: 12/18/15 To last until: 01/17/16  . HYDROcodone-acetaminophen (NORCO/VICODIN) 5-325 MG tablet    Sig: Take 1 tablet by mouth every 8 (eight) hours as needed for severe pain.    Dispense:  90 tablet    Refill:  0    Do not add this medication to the electronic "Automatic Refill" notification system. Patient may have prescription filled one day early if pharmacy is closed on scheduled refill date. Do not fill until: 01/17/16 To last until: 02/16/16  . HYDROcodone-acetaminophen (NORCO/VICODIN) 5-325 MG tablet    Sig: Take 1 tablet by mouth every 8 (eight) hours as needed for severe pain.    Dispense:  90 tablet    Refill:  0    Do not add this medication to the electronic "Automatic Refill" notification system. Patient may have prescription filled one day early if pharmacy is closed on scheduled refill date. Do not fill until: 02/16/16 To last until: 03/17/16   New Prescriptions   HYDROCODONE-ACETAMINOPHEN (NORCO/VICODIN) 5-325 MG TABLET    Take 1 tablet by mouth every 8 (eight) hours as needed for severe pain.   HYDROCODONE-ACETAMINOPHEN (NORCO/VICODIN) 5-325 MG TABLET    Take 1 tablet by mouth every 8 (eight) hours as needed for severe pain.   HYDROCODONE-ACETAMINOPHEN (NORCO/VICODIN) 5-325 MG TABLET    Take 1 tablet by mouth every 8 (eight) hours  as needed for severe pain.   Medications administered during this visit: Ms. Tenner had no medications administered during this visit. Lab-work, Procedure(s), & Referral(s) Ordered: No orders of the defined types were placed in this encounter.  Imaging & Referral(s) Ordered: None  Interventional Therapies: Scheduled:  None at this time.    Considering:  Diagnostic right intra-articular knee injection with local anesthetic and steroid under fluoroscopic guidance, no sedation. Possible series of 5 Hyalgan knee injections under fluoroscopic guidance, no sedation.  Diagnostic right-sided genicular nerve block under fluoroscopic guidance and IV sedation.  Possible right-sided genicular nerve radiofrequency ablation under fluoroscopic guidance and IV sedation.  Palliative right intra-articular shoulder joint injection with local anesthetic and steroid under fluoroscopic guidance, no sedation.  Diagnostic right-sided suprascapular nerve block under fluoroscopic guidance, with or without sedation.  Possible right-sided suprascapular nerve radiofrequency ablation under fluoroscopic guidance and IV sedation.  Palliative right lateral femoral cutaneous nerve under fluoroscopic guidance, with or without sedation.  Palliative repeat right sided lateral femoral continuous nerve radiofrequency ablation under fluoroscopic guidance and IV sedation.    PRN Procedures:  Diagnostic right intra-articular knee injection with local anesthetic and steroid under fluoroscopic guidance, no sedation.  Possible series of 5 Hyalgan knee injections under fluoroscopic guidance, no sedation.  Diagnostic right-sided genicular nerve block under fluoroscopic guidance and IV sedation.  Palliative right intra-articular shoulder joint injection with local anesthetic and steroid under fluoroscopic guidance, no sedation.  Diagnostic right-sided suprascapular nerve block under fluoroscopic guidance, with or without sedation.   Palliative right lateral femoral cutaneous nerve under fluoroscopic guidance, with or without sedation.  Palliative repeat right sided lateral femoral continuous nerve radiofrequency ablation under fluoroscopic guidance and IV sedation.    Requested PM Follow-up: Return in 3 months (on 03/04/2016) for Med-Mgmt.  Future Appointments Date Time Provider [place]  01/27/2016 1:30 PM [name], DO [facility] None  03/04/2016 1:00 PM Milinda Pointer, MD [facility] None  03/05/2016 11:15 AM Wellington Hampshire, MD [facility]  12/03/2016 2:45 PM Nori Riis, PA-C [facility] None   Primary Care Physician: [name], DO Location: [facility] Management Facility Note by: Kathlen Brunswick Dossie Arbour, M.D, DABA, DABAPM, DABPM, DABIPP, FIPP  Pain Score Disclaimer: We use the NRS-11 scale. This is a self-reported, subjective measurement of pain severity with only modest accuracy. It is used primarily to identify changes within a  particular patient. It must be understood that outpatient pain scales are significantly less accurate that those used for research, where they can be applied under ideal controlled circumstances with minimal exposure to variables. In reality, the score is likely to be a combination of pain intensity and pain affect, where pain affect describes the degree of emotional arousal or changes in action readiness caused by the sensory experience of pain. Factors such as social and work situation, setting, emotional state, anxiety levels, expectation, and prior pain experience may influence pain perception and show large inter-individual differences that may also be affected by time variables.  Patient instructions provided during this appointment: Patient Instructions  Pain Management Discharge Instructions  General Discharge Instructions :  If you need to reach your doctor call: Monday-Friday 8:00 am - 4:00 pm at [phone] or toll free [phone].   After clinic hours [phone] to have operator reach doctor.  Bring all of your medication bottles to all your appointments in the pain clinic.  To cancel or reschedule your appointment with Pain Management please remember to call 24 hours in advance to avoid a fee.  Refer to the educational materials which you have been given on: General Risks, I had my Procedure. Discharge Instructions, Post Sedation.  Post Procedure Instructions:  The drugs you were given will stay in your system until tomorrow, so for the next 24 hours you should not drive, make any legal decisions or drink any alcoholic beverages.  You may eat anything you prefer, but it is better to start with liquids then soups and crackers, and gradually work up to solid foods.  Please notify your doctor immediately if you have any unusual bleeding, trouble breathing or pain that is not related to your normal pain.  Depending on the type of procedure that was done, some parts of your body may feel week and/or numb.  This usually clears up by tonight or the next day.  Walk with the use of an assistive device or accompanied by an adult for the 24 hours.  You may use ice on the affected area for the first 24 hours.  Put ice in a Ziploc bag and cover with a towel and place against area 15 minutes on 15 minutes off.  You may switch to heat after 24 hours.

## 2015-12-20 ENCOUNTER — Other Ambulatory Visit: Payer: Self-pay | Admitting: Family Medicine

## 2015-12-20 ENCOUNTER — Other Ambulatory Visit: Payer: Self-pay | Admitting: Cardiology

## 2015-12-20 DIAGNOSIS — J4540 Moderate persistent asthma, uncomplicated: Secondary | ICD-10-CM

## 2015-12-24 DIAGNOSIS — B351 Tinea unguium: Secondary | ICD-10-CM | POA: Diagnosis not present

## 2015-12-24 DIAGNOSIS — D2372 Other benign neoplasm of skin of left lower limb, including hip: Secondary | ICD-10-CM | POA: Diagnosis not present

## 2015-12-24 DIAGNOSIS — I739 Peripheral vascular disease, unspecified: Secondary | ICD-10-CM | POA: Diagnosis not present

## 2015-12-24 DIAGNOSIS — Z96611 Presence of right artificial shoulder joint: Secondary | ICD-10-CM | POA: Diagnosis not present

## 2015-12-26 ENCOUNTER — Ambulatory Visit (INDEPENDENT_AMBULATORY_CARE_PROVIDER_SITE_OTHER): Payer: PPO | Admitting: Vascular Surgery

## 2015-12-26 ENCOUNTER — Encounter (INDEPENDENT_AMBULATORY_CARE_PROVIDER_SITE_OTHER): Payer: Self-pay | Admitting: Vascular Surgery

## 2015-12-26 VITALS — BP 150/91 | HR 68 | Resp 16 | Ht 65.0 in | Wt 262.0 lb

## 2015-12-26 DIAGNOSIS — I890 Lymphedema, not elsewhere classified: Secondary | ICD-10-CM | POA: Insufficient documentation

## 2015-12-26 DIAGNOSIS — E1151 Type 2 diabetes mellitus with diabetic peripheral angiopathy without gangrene: Secondary | ICD-10-CM

## 2015-12-26 DIAGNOSIS — I10 Essential (primary) hypertension: Secondary | ICD-10-CM

## 2015-12-26 NOTE — Progress Notes (Signed)
Subjective:    Patient ID: [name], female    DOB: 1950/10/08, 65 y.o.   MRN: [id number] Chief Complaint  Patient presents with  . Re-evaluation    Skin graft    Patient presents for a one month wound follow up. She has two very small superficial wounds located on her right calf and two very small superficial wounds on her left calf. She is wearing her compression stockings on a daily basis and elevating her legs. She is also using her lymphedema pump on a daily basis. Denies any fever, nausea or vomiting. No cellulitis or worsening of her wounds since last visit per patient.     Review of Systems  Constitutional: Negative.   HENT: Negative.   Eyes: Negative.   Respiratory: Negative.   Cardiovascular: Negative.   Gastrointestinal: Negative.   Endocrine: Negative.   Genitourinary: Negative.   Musculoskeletal: Negative.   Skin: Positive for wound (Bilateral Calves.).  Allergic/Immunologic: Negative.   Neurological: Negative.   Hematological: Negative.   Psychiatric/Behavioral: Negative.        Objective:   Physical Exam  Constitutional: She is oriented to person, place, and time. She appears well-developed and well-nourished.  HENT:  Head: Normocephalic and atraumatic.  Eyes: Conjunctivae and EOM are normal. Pupils are equal, round, and reactive to light.  Neck: Normal range of motion.  Cardiovascular: Normal rate, regular rhythm, normal heart sounds and intact distal pulses.   Pulmonary/Chest: Effort normal and breath sounds normal.  Abdominal: Soft. Bowel sounds are normal.  Musculoskeletal: Normal range of motion. She exhibits edema (No Edema Noted. ).  Neurological: She is alert and oriented to person, place, and time. She has normal reflexes.  Skin: Skin is warm and dry.     Two small very superficial ulcers on the front of each calf. All granulating well. No drainage noted. Healing well.   Psychiatric: She has a normal mood and affect. Her behavior is  normal. Judgment and thought content normal.   BP (!) 150/91 (BP Location: Right Arm)   Pulse 68   Resp 16   Ht 5\' 5"  (1.651 m)   Wt 262 lb (118.8 kg)   BMI 43.60 kg/m   Past Medical History:  Diagnosis Date  . Adrenal mass ([place])   . Anxiety   . Anxiety and depression   . Arthritis   . Asthma   . Bronchitis   . Cystocele   . Depression   . Diabetes ([place])   . DVT (deep venous thrombosis) (HCC)    Righ calf  . Dysthymic disorder   . GERD (gastroesophageal reflux disease)   . Heart murmur    Echocardiogram June 2015: Mild MR (possible vegetation seen on TTE, not seen on TEE), normal LV size with moderate concentric LVH. Normal function EF 60-65%. Normal diastolic function. Mild LA dilation.  Marland Kitchen History of IBS   . Hypothyroidism   . Insomnia   . Kidney disease   . Kidney stone   . Neuropathy involving both lower extremities   . Obesity, Class II, BMI 35-39.9, with comorbidity   . Paroxysmal atrial fibrillation ([place]) 2007   In June 2015, Cardiac Event Monitor: Mostly SR/sinus arrhythmia with PVCs that are frequent. Short bursts of A. fib lasting several minutes;; CHA2DS2-VASc Score = 5 (age, Female, PVD, DM, HTN)  . Peripheral vascular disease ([place])   . Sleep apnea    use C-PAP  . Thromboembolic deterrent (TED) stockings in place on both lower extremities   .  Venous stasis dermatitis of both lower extremities    Social History   Social History  . Marital status: Married    Spouse name: N/A  . Number of children: N/A  . Years of education: N/A   Occupational History  . Not on file.   Social History Main Topics  . Smoking status: Never Smoker  . Smokeless tobacco: Never Used  . Alcohol use No  . Drug use: No  . Sexual activity: No   Other Topics Concern  . Not on file   Social History Narrative   She is currently married -- for 28 years. Does not work. Does not smoke or take alcohol. She never smoked. She exercises at least 3 days a week since before her  gastric surgery.   Marital status reviewed in history of present illness.   Past Surgical History:  Procedure Laterality Date  . ANKLE SURGERY Right   . APPLICATION OF WOUND VAC Left 06/27/2015   Procedure: APPLICATION OF WOUND VAC ( POSSIBLE ) ;  Surgeon: [name], MD;  Location: [facility] ORS;  Service: Vascular;  Laterality: Left;  . CHOLECYSTECTOMY    . HERNIA REPAIR    . HIATAL HERNIA REPAIR    . I&D EXTREMITY Left 06/27/2015   Procedure: IRRIGATION AND DEBRIDEMENT EXTREMITY            ( CALF HEMATOMA ) POSSIBLE WOUND VAC;  Surgeon: [name], MD;  Location: [facility] ORS;  Service: Vascular;  Laterality: Left;  . JOINT REPLACEMENT     left knee replacement  . KIDNEY SURGERY    . LITHOTRIPSY    . NM [facility] HX)  February 2017   Likely breast attenuation. LOW RISK study. Normal EF 55-60%.  . removal of left hematoma    . REVERSE SHOULDER ARTHROPLASTY Right 10/08/2015   Procedure: REVERSE SHOULDER ARTHROPLASTY;  Surgeon: [name], MD;  Location: [facility] ORS;  Service: Orthopedics;  Laterality: Right;  . SLEEVE GASTROPLASTY    . TONSILLECTOMY    . TOTAL KNEE ARTHROPLASTY    . TRANSESOPHAGEAL ECHOCARDIOGRAM  08/08/2013   Mild LVH, EF 60-65%. Moderate LA dilation and mild RA dilation. Mild MR with no evidence of stenosis and no evidence of endocarditis. A false chordae is noted.  . TRANSTHORACIC ECHOCARDIOGRAM  08/03/2013   Mild-Moderate concentric LVH, EF 60-65%. Normal diastolic function. Mild LA dilation. Mild MR with possible vegetation  - not confirmed on TEE   . VAGINAL HYSTERECTOMY     Family History  Problem Relation Age of Onset  . Skin cancer Father   . Diabetes Father   . Hypertension Father   . Peripheral vascular disease Father   . Cancer Father   . Cerebral aneurysm Father   . Alcohol abuse Father   . Varicose Veins Mother   . Kidney disease Mother   . Arthritis Mother   . Mental illness Sister   . Cancer Maternal Aunt     breast  . Breast cancer  Maternal Aunt   . Arthritis Maternal Grandmother   . Hypertension Maternal Grandmother   . Diabetes Maternal Grandmother   . Arthritis Maternal Grandfather   . Heart disease Maternal Grandfather   . Hypertension Maternal Grandfather   . Arthritis Paternal Grandmother   . Hypertension Paternal Grandmother   . Diabetes Paternal Grandmother   . Arthritis Paternal Grandfather   . Hypertension Paternal Grandfather    Allergies  Allergen Reactions  . Aspirin Hives  . Penicillins Hives  .  Terramycin [Oxytetracycline] Hives      Assessment & Plan:  Patient presents for a one month wound follow up. She has two very small superficial wounds located on her right calf and two very small superficial wounds on her left calf. She is wearing her compression stockings on a daily basis and elevating her legs. She is also using her lymphedema pump on a daily basis. Denies any fever, nausea or vomiting. No cellulitis or worsening of her wounds since last visit per patient.   1. Hypertension goal BP (blood pressure) < 150/90 - Stable Encouraged good control as its slows the progression of atherosclerotic disease  2. DM (diabetes mellitus) type II controlled peripheral vascular disorder (HCC) - Stable Encouraged good control as its slows the progression of atherosclerotic disease  3. Lymphedema - Stable Ulcerations seem to be healing. No edema noted on examination. Continue elevation, compression stockings and lymphedema pump daily.   Current Outpatient Prescriptions on File Prior to Visit  Medication Sig Dispense Refill  . acetaminophen (TYLENOL) 325 MG tablet Take 650 mg by mouth 2 (two) times daily.     Marland Kitchen albuterol (PROVENTIL HFA;VENTOLIN HFA) 108 (90 Base) MCG/ACT inhaler Inhale 1-2 puffs into the lungs every 4 (four) hours as needed for wheezing or shortness of breath. 8 g 2  . albuterol (PROVENTIL) (2.5 MG/3ML) 0.083% nebulizer solution Take 3 mLs (2.5 mg total) by nebulization every 6 (six)  hours as needed for wheezing or shortness of breath. 150 mL 1  . ALPRAZolam (XANAX) 0.5 MG tablet TAKE ONE TABLET TWICE DAILY AS NEEDED 60 tablet 5  . buPROPion (WELLBUTRIN XL) 300 MG 24 hr tablet TAKE ONE TABLET BY MOUTH EVERY DAY 30 tablet 5  . dabigatran (PRADAXA) 150 MG CAPS capsule Take 150 mg by mouth 2 (two) times daily.    . DULoxetine (CYMBALTA) 60 MG capsule Take 1 capsule (60 mg total) by mouth daily. 30 capsule 2  . ESTRACE VAGINAL 0.1 MG/GM vaginal cream INSERT 1 GRAM VAGINALLY TWICE A WEEK 42.5 g 6  . gabapentin (NEURONTIN) 800 MG tablet Take 1 tablet (800 mg total) by mouth every 6 (six) hours. 120 tablet 2  . HYDROcodone-acetaminophen (NORCO/VICODIN) 5-325 MG tablet Take 1 tablet by mouth every 8 (eight) hours as needed for severe pain. 90 tablet 0  . [START ON 01/17/2016] HYDROcodone-acetaminophen (NORCO/VICODIN) 5-325 MG tablet Take 1 tablet by mouth every 8 (eight) hours as needed for severe pain. 90 tablet 0  . [START ON 02/16/2016] HYDROcodone-acetaminophen (NORCO/VICODIN) 5-325 MG tablet Take 1 tablet by mouth every 8 (eight) hours as needed for severe pain. 90 tablet 0  . levothyroxine (SYNTHROID, LEVOTHROID) 150 MCG tablet TAKE ONE TABLET BY MOUTH EVERY MORNING 90 tablet 1  . Melatonin 10 MG CAPS Take 20 mg by mouth at bedtime as needed. 60 capsule 2  . meloxicam (MOBIC) 15 MG tablet Take 15 mg by mouth daily.     . metFORMIN (GLUCOPHAGE) 1000 MG tablet One pill by mouth every morning about 15 minutes before morning meal, and two pills 15 minutes before evening meal (Patient taking differently: Take 1,000 mg by mouth daily with breakfast. 500 mg Metformin daily  before supper) 90 tablet 3  . metoprolol tartrate (LOPRESSOR) 25 MG tablet TAKE ONE TABLET TWICE DAILY 60 tablet 0  . mirabegron ER (MYRBETRIQ) 25 MG TB24 tablet Take 1 tablet (25 mg total) by mouth daily. 30 tablet 12  . montelukast (SINGULAIR) 10 MG tablet TAKE ONE TABLET BY MOUTH EVERY DAY  30 tablet 0  . oxybutynin  (DITROPAN-XL) 10 MG 24 hr tablet Take 1 tablet (10 mg total) by mouth daily. 30 tablet 12  . pantoprazole (PROTONIX) 40 MG tablet TAKE ONE TABLET BY MOUTH TWICE DAILY AS NEEDED. CAUTION: PROLONGED USE MAY INCREASE RISKOF PNEUMONIA,COLITIS,ANEMIA,OSTEOPOROSIS 60 tablet 1  . quinapril (ACCUPRIL) 20 MG tablet Take 2 tablets (40 mg total) by mouth daily. 90 tablet 1  . fluticasone-salmeterol (ADVAIR HFA) 115-21 MCG/ACT inhaler Inhale 2 puffs into the lungs 2 (two) times daily. (Patient not taking: Reported on 12/26/2015) 1 Inhaler 12   No current facility-administered medications on file prior to visit.     There are no Patient Instructions on file for this visit. Return in about 1 month (around 01/26/2016) for Wound Check.  KIMBERLY A STEGMAYER, PA-C

## 2015-12-27 DIAGNOSIS — Z96611 Presence of right artificial shoulder joint: Secondary | ICD-10-CM | POA: Diagnosis not present

## 2015-12-30 DIAGNOSIS — Z96611 Presence of right artificial shoulder joint: Secondary | ICD-10-CM | POA: Diagnosis not present

## 2016-01-06 DIAGNOSIS — Z96611 Presence of right artificial shoulder joint: Secondary | ICD-10-CM | POA: Diagnosis not present

## 2016-01-09 DIAGNOSIS — Z96611 Presence of right artificial shoulder joint: Secondary | ICD-10-CM | POA: Diagnosis not present

## 2016-01-20 ENCOUNTER — Other Ambulatory Visit: Payer: Self-pay | Admitting: Family Medicine

## 2016-01-20 DIAGNOSIS — J4540 Moderate persistent asthma, uncomplicated: Secondary | ICD-10-CM

## 2016-01-20 NOTE — Telephone Encounter (Signed)
Historical medication. Pt last seen 11/06/15. Please advise?

## 2016-01-22 DIAGNOSIS — Z96611 Presence of right artificial shoulder joint: Secondary | ICD-10-CM | POA: Diagnosis not present

## 2016-01-27 ENCOUNTER — Encounter: Payer: Self-pay | Admitting: Family Medicine

## 2016-01-27 ENCOUNTER — Ambulatory Visit (INDEPENDENT_AMBULATORY_CARE_PROVIDER_SITE_OTHER): Payer: PPO | Admitting: Family Medicine

## 2016-01-27 DIAGNOSIS — E1151 Type 2 diabetes mellitus with diabetic peripheral angiopathy without gangrene: Secondary | ICD-10-CM

## 2016-01-27 DIAGNOSIS — I480 Paroxysmal atrial fibrillation: Secondary | ICD-10-CM

## 2016-01-27 DIAGNOSIS — J4540 Moderate persistent asthma, uncomplicated: Secondary | ICD-10-CM

## 2016-01-27 DIAGNOSIS — I10 Essential (primary) hypertension: Secondary | ICD-10-CM

## 2016-01-27 NOTE — Assessment & Plan Note (Signed)
Stable on metoprolol & Pradaxa.

## 2016-01-27 NOTE — Progress Notes (Signed)
Subjective:  Patient ID: [name], female    DOB: 1950-06-18  Age: 65 y.o. MRN: [id number]  CC: Follow up  HPI:  65 year old female with a complicated PMH including chronic pain, Afib, PAD, HTN, DM-2, Asthma, OSA, Depression/anxiety presents for follow up.  HTN  Well controlled on Accupril, Metoprolol.  DM-2 with complications  Sugars well controlled - 110's-120's.  Compliant with metformin. Tolerating without difficulty.  Afib  Stable on Pradaxa and metoprolol.   Asthma  Has been coughing and wheezing recently.  Endorses compliance with Advair.  Social Hx   Social History   Social History  . Marital status: Married    Spouse name: N/A  . Number of children: N/A  . Years of education: N/A   Social History Main Topics  . Smoking status: Never Smoker  . Smokeless tobacco: Never Used  . Alcohol use No  . Drug use: No  . Sexual activity: No   Other Topics Concern  . None   Social History Narrative   She is currently married -- for 28 years. Does not work. Does not smoke or take alcohol. She never smoked. She exercises at least 3 days a week since before her gastric surgery.   Marital status reviewed in history of present illness.   Review of Systems  Respiratory: Positive for cough and wheezing.   Musculoskeletal:       Shoulder pain.   Objective:  BP 140/62   Pulse 62   Wt 252 lb (114.3 kg)   SpO2 90%   BMI 41.93 kg/m   BP/Weight 01/27/2016 12/26/2015 [id number]  Systolic BP [id number]  Diastolic BP 62 91 60  Wt. (Lbs) 252 262 252  BMI 41.93 43.6 43.26   Physical Exam  Constitutional: She is oriented to person, place, and time.  Morbidly obese female in no acute distress.  Cardiovascular: Normal rate and regular rhythm.   Pulmonary/Chest: Effort normal.  Faint expiratory wheezing.  Neurological: She is alert and oriented to person, place, and time.  Psychiatric: She has a normal mood and affect.  Vitals reviewed.  Lab Results    Component Value Date   WBC 5.8 11/06/2015   HGB 11.4 (L) 11/06/2015   HCT 35.1 (L) 11/06/2015   PLT 396.0 11/06/2015   GLUCOSE 170 (H) 10/11/2015   CHOL 173 08/05/2015   TRIG 50 08/05/2015   HDL 97 08/05/2015   LDLCALC 66 08/05/2015   ALT 14 08/05/2015   AST 14 (L) 08/05/2015   NA 137 10/11/2015   K 4.0 10/11/2015   CL 102 10/11/2015   CREATININE 0.60 10/11/2015   BUN 15 10/11/2015   CO2 27 10/11/2015   TSH 1.565 08/05/2015   INR 1.18 09/26/2015   HGBA1C 6.6 (H) 08/05/2015    Assessment & Plan:   Problem List Items Addressed This Visit    Paroxysmal atrial fibrillation ([place]); CHA2DS2-Vasc Score 5. On Pradaxa (Chronic)    Stable on metoprolol & Pradaxa.      Hypertension goal BP (blood pressure) < 150/90 (Chronic)    Stable and at goal. Continue Accupril and metoprolol.      DM (diabetes mellitus) type II controlled peripheral vascular disorder (HCC) (Chronic)    A1c ago. Sugars well controlled. Continue metformin.      Asthma, moderate persistent, well-controlled    Mild wheezing on exam per Advise continue use of Advair. Advised to use albuterol over the next few days.        Follow-up:  Return in about 3 months (around 04/28/2016).  [place]

## 2016-01-27 NOTE — Assessment & Plan Note (Addendum)
A1c ago. Sugars well controlled. Continue metformin.

## 2016-01-27 NOTE — Assessment & Plan Note (Signed)
Stable and at goal. Continue Accupril and metoprolol.

## 2016-01-27 NOTE — Assessment & Plan Note (Signed)
Mild wheezing on exam per Advise continue use of Advair. Advised to use albuterol over the next few days.

## 2016-01-27 NOTE — Patient Instructions (Signed)
You're doing well.  Continue your meds.  Follow up in 3 months.  Take care  Dr. Lacinda Axon

## 2016-01-28 DIAGNOSIS — Z96611 Presence of right artificial shoulder joint: Secondary | ICD-10-CM | POA: Diagnosis not present

## 2016-01-29 ENCOUNTER — Ambulatory Visit (INDEPENDENT_AMBULATORY_CARE_PROVIDER_SITE_OTHER): Payer: PPO | Admitting: Vascular Surgery

## 2016-01-29 ENCOUNTER — Ambulatory Visit: Payer: PPO | Attending: Specialist

## 2016-01-29 DIAGNOSIS — G4761 Periodic limb movement disorder: Secondary | ICD-10-CM | POA: Diagnosis not present

## 2016-01-29 DIAGNOSIS — G4733 Obstructive sleep apnea (adult) (pediatric): Secondary | ICD-10-CM | POA: Insufficient documentation

## 2016-01-30 DIAGNOSIS — S90821A Blister (nonthermal), right foot, initial encounter: Secondary | ICD-10-CM | POA: Diagnosis not present

## 2016-01-30 DIAGNOSIS — I739 Peripheral vascular disease, unspecified: Secondary | ICD-10-CM | POA: Diagnosis not present

## 2016-01-30 DIAGNOSIS — Z96611 Presence of right artificial shoulder joint: Secondary | ICD-10-CM | POA: Diagnosis not present

## 2016-02-04 DIAGNOSIS — Z96611 Presence of right artificial shoulder joint: Secondary | ICD-10-CM | POA: Diagnosis not present

## 2016-02-12 DIAGNOSIS — Z96611 Presence of right artificial shoulder joint: Secondary | ICD-10-CM | POA: Diagnosis not present

## 2016-02-14 ENCOUNTER — Other Ambulatory Visit: Payer: Self-pay | Admitting: Family Medicine

## 2016-02-14 DIAGNOSIS — J4540 Moderate persistent asthma, uncomplicated: Secondary | ICD-10-CM

## 2016-02-18 ENCOUNTER — Other Ambulatory Visit: Payer: Self-pay | Admitting: Specialist

## 2016-02-18 DIAGNOSIS — H524 Presbyopia: Secondary | ICD-10-CM | POA: Diagnosis not present

## 2016-02-18 DIAGNOSIS — H52223 Regular astigmatism, bilateral: Secondary | ICD-10-CM | POA: Diagnosis not present

## 2016-02-18 DIAGNOSIS — H2513 Age-related nuclear cataract, bilateral: Secondary | ICD-10-CM | POA: Diagnosis not present

## 2016-02-18 DIAGNOSIS — H5213 Myopia, bilateral: Secondary | ICD-10-CM | POA: Diagnosis not present

## 2016-02-18 DIAGNOSIS — J4520 Mild intermittent asthma, uncomplicated: Secondary | ICD-10-CM

## 2016-02-18 DIAGNOSIS — G4733 Obstructive sleep apnea (adult) (pediatric): Secondary | ICD-10-CM | POA: Diagnosis not present

## 2016-02-18 DIAGNOSIS — Z9989 Dependence on other enabling machines and devices: Secondary | ICD-10-CM | POA: Diagnosis not present

## 2016-02-18 DIAGNOSIS — E669 Obesity, unspecified: Secondary | ICD-10-CM | POA: Diagnosis not present

## 2016-02-18 DIAGNOSIS — E119 Type 2 diabetes mellitus without complications: Secondary | ICD-10-CM | POA: Diagnosis not present

## 2016-02-18 LAB — HM DIABETES EYE EXAM

## 2016-02-25 ENCOUNTER — Ambulatory Visit: Payer: PPO | Attending: Pain Medicine | Admitting: Pain Medicine

## 2016-02-25 ENCOUNTER — Encounter: Payer: Self-pay | Admitting: Pain Medicine

## 2016-02-25 VITALS — BP 190/75 | HR 59 | Temp 98.4°F | Resp 16 | Ht 64.0 in | Wt 247.0 lb

## 2016-02-25 DIAGNOSIS — M1990 Unspecified osteoarthritis, unspecified site: Secondary | ICD-10-CM | POA: Diagnosis not present

## 2016-02-25 DIAGNOSIS — E1142 Type 2 diabetes mellitus with diabetic polyneuropathy: Secondary | ICD-10-CM | POA: Insufficient documentation

## 2016-02-25 DIAGNOSIS — J4540 Moderate persistent asthma, uncomplicated: Secondary | ICD-10-CM | POA: Insufficient documentation

## 2016-02-25 DIAGNOSIS — Z7984 Long term (current) use of oral hypoglycemic drugs: Secondary | ICD-10-CM | POA: Insufficient documentation

## 2016-02-25 DIAGNOSIS — I10 Essential (primary) hypertension: Secondary | ICD-10-CM | POA: Diagnosis not present

## 2016-02-25 DIAGNOSIS — G894 Chronic pain syndrome: Secondary | ICD-10-CM | POA: Diagnosis not present

## 2016-02-25 DIAGNOSIS — F418 Other specified anxiety disorders: Secondary | ICD-10-CM | POA: Insufficient documentation

## 2016-02-25 DIAGNOSIS — G8929 Other chronic pain: Secondary | ICD-10-CM | POA: Insufficient documentation

## 2016-02-25 DIAGNOSIS — F1190 Opioid use, unspecified, uncomplicated: Secondary | ICD-10-CM

## 2016-02-25 DIAGNOSIS — I480 Paroxysmal atrial fibrillation: Secondary | ICD-10-CM | POA: Insufficient documentation

## 2016-02-25 DIAGNOSIS — M79671 Pain in right foot: Secondary | ICD-10-CM | POA: Diagnosis not present

## 2016-02-25 DIAGNOSIS — Z79899 Other long term (current) drug therapy: Secondary | ICD-10-CM | POA: Diagnosis not present

## 2016-02-25 DIAGNOSIS — E1151 Type 2 diabetes mellitus with diabetic peripheral angiopathy without gangrene: Secondary | ICD-10-CM | POA: Insufficient documentation

## 2016-02-25 DIAGNOSIS — I890 Lymphedema, not elsewhere classified: Secondary | ICD-10-CM | POA: Insufficient documentation

## 2016-02-25 DIAGNOSIS — K219 Gastro-esophageal reflux disease without esophagitis: Secondary | ICD-10-CM | POA: Diagnosis not present

## 2016-02-25 DIAGNOSIS — E039 Hypothyroidism, unspecified: Secondary | ICD-10-CM | POA: Diagnosis not present

## 2016-02-25 DIAGNOSIS — Z7902 Long term (current) use of antithrombotics/antiplatelets: Secondary | ICD-10-CM | POA: Diagnosis not present

## 2016-02-25 DIAGNOSIS — Z96619 Presence of unspecified artificial shoulder joint: Secondary | ICD-10-CM | POA: Diagnosis not present

## 2016-02-25 DIAGNOSIS — Z6841 Body Mass Index (BMI) 40.0 and over, adult: Secondary | ICD-10-CM | POA: Insufficient documentation

## 2016-02-25 DIAGNOSIS — G4730 Sleep apnea, unspecified: Secondary | ICD-10-CM | POA: Insufficient documentation

## 2016-02-25 DIAGNOSIS — M25511 Pain in right shoulder: Secondary | ICD-10-CM | POA: Diagnosis not present

## 2016-02-25 DIAGNOSIS — Z79891 Long term (current) use of opiate analgesic: Secondary | ICD-10-CM | POA: Insufficient documentation

## 2016-02-25 MED ORDER — HYDROCODONE-ACETAMINOPHEN 5-325 MG PO TABS: 1.0000 | ORAL_TABLET | Freq: Three times a day (TID) | ORAL | 0 refills | Status: DC | PRN

## 2016-02-25 MED ORDER — DULOXETINE HCL 60 MG PO CPEP: 60.0000 mg | ORAL_CAPSULE | Freq: Every day | ORAL | 2 refills | Status: DC

## 2016-02-25 MED ORDER — GABAPENTIN 800 MG PO TABS: 800.0000 mg | ORAL_TABLET | Freq: Four times a day (QID) | ORAL | 0 refills | Status: DC

## 2016-02-25 NOTE — Patient Instructions (Signed)
GENERAL RISKS AND COMPLICATIONS  What are the risk, side effects and possible complications? Generally speaking, most procedures are safe.  However, with any procedure there are risks, side effects, and the possibility of complications.  The risks and complications are dependent upon the sites that are lesioned, or the type of nerve block to be performed.  The closer the procedure is to the spine, the more serious the risks are.  Great care is taken when placing the radio frequency needles, block needles or lesioning probes, but sometimes complications can occur. 1. Infection: Any time there is an injection through the skin, there is a risk of infection.  This is why sterile conditions are used for these blocks.  There are four possible types of infection. 1. Localized skin infection. 2. Central Nervous System Infection-This can be in the form of Meningitis, which can be deadly. 3. Epidural Infections-This can be in the form of an epidural abscess, which can cause pressure inside of the spine, causing compression of the spinal cord with subsequent paralysis. This would require an emergency surgery to decompress, and there are no guarantees that the patient would recover from the paralysis. 4. Discitis-This is an infection of the intervertebral discs.  It occurs in about 1% of discography procedures.  It is difficult to treat and it may lead to surgery.        2. Pain: the needles have to go through skin and soft tissues, will cause soreness.       3. Damage to internal structures:  The nerves to be lesioned may be near blood vessels or    other nerves which can be potentially damaged.       4. Bleeding: Bleeding is more common if the patient is taking blood thinners such as  aspirin, Coumadin, Ticiid, Plavix, etc., or if he/she have some genetic predisposition  such as hemophilia. Bleeding into the spinal canal can cause compression of the spinal  cord with subsequent paralysis.  This would require an  emergency surgery to  decompress and there are no guarantees that the patient would recover from the  paralysis.       5. Pneumothorax:  Puncturing of a lung is a possibility, every time a needle is introduced in  the area of the chest or upper back.  Pneumothorax refers to free air around the  collapsed lung(s), inside of the thoracic cavity (chest cavity).  Another two possible  complications related to a similar event would include: Hemothorax and Chylothorax.   These are variations of the Pneumothorax, where instead of air around the collapsed  lung(s), you may have blood or chyle, respectively.       6. Spinal headaches: They may occur with any procedures in the area of the spine.       7. Persistent CSF (Cerebro-Spinal Fluid) leakage: This is a rare problem, but may occur  with prolonged intrathecal or epidural catheters either due to the formation of a fistulous  track or a dural tear.       8. Nerve damage: By working so close to the spinal cord, there is always a possibility of  nerve damage, which could be as serious as a permanent spinal cord injury with  paralysis.       9. Death:  Although rare, severe deadly allergic reactions known as "Anaphylactic  reaction" can occur to any of the medications used.      10. Worsening of the symptoms:  We can always make thing worse.    What are the chances of something like this happening? Chances of any of this occuring are extremely low.  By statistics, you have more of a chance of getting killed in a motor vehicle accident: while driving to the hospital than any of the above occurring .  Nevertheless, you should be aware that they are possibilities.  In general, it is similar to taking a shower.  Everybody knows that you can slip, hit your head and get killed.  Does that mean that you should not shower again?  Nevertheless always keep in mind that statistics do not mean anything if you happen to be on the wrong side of them.  Even if a procedure has a 1  (one) in a 1,000,000 (million) chance of going wrong, it you happen to be that one..Also, keep in mind that by statistics, you have more of a chance of having something go wrong when taking medications.  Who should not have this procedure? If you are on a blood thinning medication (e.g. Coumadin, Plavix, see list of "Blood Thinners"), or if you have an active infection going on, you should not have the procedure.  If you are taking any blood thinners, please inform your physician.  How should I prepare for this procedure?  Do not eat or drink anything at least six hours prior to the procedure.  Bring a driver with you .  It cannot be a taxi.  Come accompanied by an adult that can drive you back, and that is strong enough to help you if your legs get weak or numb from the local anesthetic.  Take all of your medicines the morning of the procedure with just enough water to swallow them.  If you have diabetes, make sure that you are scheduled to have your procedure done first thing in the morning, whenever possible.  If you have diabetes, take only half of your insulin dose and notify our nurse that you have done so as soon as you arrive at the clinic.  If you are diabetic, but only take blood sugar pills (oral hypoglycemic), then do not take them on the morning of your procedure.  You may take them after you have had the procedure.  Do not take aspirin or any aspirin-containing medications, at least eleven (11) days prior to the procedure.  They may prolong bleeding.  Wear loose fitting clothing that may be easy to take off and that you would not mind if it got stained with Betadine or blood.  Do not wear any jewelry or perfume  Remove any nail coloring.  It will interfere with some of our monitoring equipment.  NOTE: Remember that this is not meant to be interpreted as a complete list of all possible complications.  Unforeseen problems may occur.  BLOOD THINNERS The following drugs  contain aspirin or other products, which can cause increased bleeding during surgery and should not be taken for 2 weeks prior to and 1 week after surgery.  If you should need take something for relief of minor pain, you may take acetaminophen which is found in Tylenol,m Datril, Anacin-3 and Panadol. It is not blood thinner. The products listed below are.  Do not take any of the products listed below in addition to any listed on your instruction sheet.  A.P.C or A.P.C with Codeine Codeine Phosphate Capsules #3 Ibuprofen Ridaura  ABC compound Congesprin Imuran rimadil  Advil Cope Indocin Robaxisal  Alka-Seltzer Effervescent Pain Reliever and Antacid Coricidin or Coricidin-D  Indomethacin Rufen    Alka-Seltzer plus Cold Medicine Cosprin Ketoprofen S-A-C Tablets  Anacin Analgesic Tablets or Capsules Coumadin Korlgesic Salflex  Anacin Extra Strength Analgesic tablets or capsules CP-2 Tablets Lanoril Salicylate  Anaprox Cuprimine Capsules Levenox Salocol  Anexsia-D Dalteparin Magan Salsalate  Anodynos Darvon compound Magnesium Salicylate Sine-off  Ansaid Dasin Capsules Magsal Sodium Salicylate  Anturane Depen Capsules Marnal Soma  APF Arthritis pain formula Dewitt's Pills Measurin Stanback  Argesic Dia-Gesic Meclofenamic Sulfinpyrazone  Arthritis Bayer Timed Release Aspirin Diclofenac Meclomen Sulindac  Arthritis pain formula Anacin Dicumarol Medipren Supac  Analgesic (Safety coated) Arthralgen Diffunasal Mefanamic Suprofen  Arthritis Strength Bufferin Dihydrocodeine Mepro Compound Suprol  Arthropan liquid Dopirydamole Methcarbomol with Aspirin Synalgos  ASA tablets/Enseals Disalcid Micrainin Tagament  Ascriptin Doan's Midol Talwin  Ascriptin A/D Dolene Mobidin Tanderil  Ascriptin Extra Strength Dolobid Moblgesic Ticlid  Ascriptin with Codeine Doloprin or Doloprin with Codeine Momentum Tolectin  Asperbuf Duoprin Mono-gesic Trendar  Aspergum Duradyne Motrin or Motrin IB Triminicin  Aspirin  plain, buffered or enteric coated Durasal Myochrisine Trigesic  Aspirin Suppositories Easprin Nalfon Trillsate  Aspirin with Codeine Ecotrin Regular or Extra Strength Naprosyn Uracel  Atromid-S Efficin Naproxen Ursinus  Auranofin Capsules Elmiron Neocylate Vanquish  Axotal Emagrin Norgesic Verin  Azathioprine Empirin or Empirin with Codeine Normiflo Vitamin E  Azolid Emprazil Nuprin Voltaren  Bayer Aspirin plain, buffered or children's or timed BC Tablets or powders Encaprin Orgaran Warfarin Sodium  Buff-a-Comp Enoxaparin Orudis Zorpin  Buff-a-Comp with Codeine Equegesic Os-Cal-Gesic   Buffaprin Excedrin plain, buffered or Extra Strength Oxalid   Bufferin Arthritis Strength Feldene Oxphenbutazone   Bufferin plain or Extra Strength Feldene Capsules Oxycodone with Aspirin   Bufferin with Codeine Fenoprofen Fenoprofen Pabalate or Pabalate-SF   Buffets II Flogesic Panagesic   Buffinol plain or Extra Strength Florinal or Florinal with Codeine Panwarfarin   Buf-Tabs Flurbiprofen Penicillamine   Butalbital Compound Four-way cold tablets Penicillin   Butazolidin Fragmin Pepto-Bismol   Carbenicillin Geminisyn Percodan   Carna Arthritis Reliever Geopen Persantine   Carprofen Gold's salt Persistin   Chloramphenicol Goody's Phenylbutazone   Chloromycetin Haltrain Piroxlcam   Clmetidine heparin Plaquenil   Cllnoril Hyco-pap Ponstel   Clofibrate Hydroxy chloroquine Propoxyphen         Before stopping any of these medications, be sure to consult the physician who ordered them.  Some, such as Coumadin (Warfarin) are ordered to prevent or treat serious conditions such as "deep thrombosis", "pumonary embolisms", and other heart problems.  The amount of time that you may need off of the medication may also vary with the medication and the reason for which you were taking it.  If you are taking any of these medications, please make sure you notify your pain physician before you undergo any  procedures.          

## 2016-02-25 NOTE — Progress Notes (Signed)
Nursing Pain Medication Assessment:  Safety precautions to be maintained throughout the outpatient stay will include: orient to surroundings, keep bed in low position, maintain call bell within reach at all times, provide assistance with transfer out of bed and ambulation.  Medication Inspection Compliance: Pill count conducted under aseptic conditions, in front of the patient. Neither the pills nor the bottle was removed from the patient's sight at any time. Once count was completed pills were immediately returned to the patient in their original bottle.  Medication: Hydrocodone/APAP Pill Count: 12 of 90 pills remain Bottle Appearance: Standard pharmacy container. Clearly labeled. Filled Date: 33 / 14 / 2017 Medication last intake:02-24-16 at 2200

## 2016-02-25 NOTE — Progress Notes (Signed)
Patient's Name: [name]  MRN: [id number]  Referring Provider: [name], DO  DOB: Dec 21, 1950  PCP: [name], DO  DOS: 02/25/2016  Note by: Kathlen Brunswick. Dossie Arbour, MD  Service setting: Ambulatory outpatient  Specialty: Interventional Pain Management  Location: [facility] (AMB) Pain Management Facility    Patient type: Established   Primary Reason(s) for Visit: Encounter for prescription drug management (Level of risk: moderate) CC: Foot Pain (both feet)  HPI  [name] is a 65 y.o. year old, female patient, who comes today for a medication management evaluation. She has Paroxysmal atrial fibrillation ([place]); CHA2DS2-Vasc Score 5. On Pradaxa; Hypertension goal BP (blood pressure) < 150/90; Peripheral vascular disease due to secondary diabetes mellitus ([place]); Apnea, sleep; DM (diabetes mellitus) type II controlled peripheral vascular disorder ([place]); Diabetic peripheral neuropathy associated with type 2 diabetes mellitus ([place]); Asthma, moderate persistent, well-controlled; Hypothyroidism, adult; Anxiety and depression; Bladder cystocele; Gastro-esophageal reflux disease without esophagitis; Mixed incontinence; Atrophic vaginitis; Myelolipoma of right adrenal gland; Opiate use (15 MME/Day); Lateral femoral cutaneous entrapment syndrome (Right); Other shoulder lesions (Right); Neuropathy of right lateral femoral cutaneous nerve; Morbid obesity with BMI of 40.0-44.9, adult ([place]); Chronic prescription benzodiazepine use; Osteoarthritis, multiple sites; Status post reverse total shoulder replacement; DOE (dyspnea on exertion); Lymphedema; Chronic pain syndrome; Long term current use of opiate analgesic; Long term prescription opiate use; and Chronic right shoulder pain on her problem list. Her primarily concern today is the Foot Pain (both feet)  Pain Assessment: Self-Reported Pain Score: 6 /10 Clinically the patient looks like a 2/10 Reported level is inconsistent with clinical observations. Information  on the proper use of the pain score provided to the patient today. Pain Type: Chronic pain Pain Location: Foot Pain Orientation: Right, Left Pain Descriptors / Indicators: Aching, Burning, Constant (stinging and sharp pain at times) Pain Frequency: Constant  [name] was last seen on 12/18/2015 for medication management. During today's appointment we reviewed [name]'s chronic pain status, as well as her outpatient medication regimen. She continues to be morbidly obese, which is affecting her health. She gets out of breath, even getting up from a chair. She comes in today having problems with her feet secondary to her diabetic peripheral neuropathy and poor blood flow. She seems to be having a small skin lesion over her left great toe. She is seeing the podiatrist for this. She denies any problems with her medications. Her pain problem seems to be stable. Does have some chronic shoulder pain and she recently had a reverse replacement. Even though she continues to have pain in the right shoulder, she says that it is much better. Today we gave her the option of a diagnostic suprascapular nerve block with the possibility of RFA. She indicates that she will research it and think about it.  The patient  reports that she does not use drugs. Her body mass index is 42.4 kg/m.  Further details on both, my assessment(s), as well as the proposed treatment plan, please see below.  Controlled Substance Pharmacotherapy Assessment REMS (Risk Evaluation and Mitigation Strategy)  Analgesic:Hydrocodone/APAP 5/325 one tablet every 8 hours (15 mg/day) MME/day:15 mg/day Landis Martins, RN  02/25/2016 12:05 PM  Sign at close encounter Nursing Pain Medication Assessment:  Safety precautions to be maintained throughout the outpatient stay will include: orient to surroundings, keep bed in low position, maintain call bell within reach at all times, provide assistance with transfer out of bed and ambulation.   Medication Inspection Compliance: Pill count conducted  under aseptic conditions, in front of the patient. Neither the pills nor the bottle was removed from the patient's sight at any time. Once count was completed pills were immediately returned to the patient in their original bottle.  Medication: Hydrocodone/APAP Pill Count: 12 of 90 pills remain Bottle Appearance: Standard pharmacy container. Clearly labeled. Filled Date: 67 / 14 / 2017 Medication last intake:02-24-16 at [place], [place]  02/25/2016 11:36 AM  Sign at close encounter Nursing Pain Medication Assessment:  Safety precautions to be maintained throughout the outpatient stay will include: orient to surroundings, keep bed in low position, maintain call bell within reach at all times, provide assistance with transfer out of bed and ambulation.  Medication Inspection Compliance: [name] did not comply with our request to bring her pills to be counted. She was reminded that bringing the medication bottles, even when empty, is a requirement. Pill Count: No pills available to be counted today. Bottle Appearance: No container available. Did not bring bottle(s) to appointment. Medication: None brought in. Filled Date: N/A Medication last intake: 02/25/2016   Pharmacokinetics: Liberation and absorption (onset of action): WNL Distribution (time to peak effect): WNL Metabolism and excretion (duration of action): WNL         Pharmacodynamics: Desired effects: Analgesia: Ms. Wissner reports >50% benefit. Functional ability: Patient reports that medication allows her to accomplish basic ADLs Clinically meaningful improvement in function (CMIF): Sustained CMIF goals met Perceived effectiveness: Described as relatively effective, allowing for increase in activities of daily living (ADL) Undesirable effects: Side-effects or Adverse reactions: None reported Monitoring: [place] PMP: Online review of the past 64-monthperiod conducted.  Compliant with practice rules and regulations List of all UDS test(s) done:  Lab Results  Component Value Date   TOXASSSELUR FINAL 09/17/2015   TOXASSSELUR FINAL 07/12/2015   TOXASSSELUR FINAL 04/17/2015   TSwitzerFINAL 01/16/2015   Last UDS on record: Tox[employer] Select 13  Date Value Ref Range Status  09/17/2015 FINAL  Final    Comment:    ==================================================================== TOXASSURE SELECT 13 (MW) ==================================================================== Test                             Result       Flag       Units Drug Absent but Declared for Prescription Verification   Alprazolam                     Not Detected UNEXPECTED ng/mg creat   Hydrocodone                    Not Detected UNEXPECTED ng/mg creat ==================================================================== Test                      Result    Flag   Units      Ref Range   Creatinine              116              mg/dL      >=20 ==================================================================== Declared Medications:  The flagging and interpretation on this report are based on the  following declared medications.  Unexpected results may arise from  inaccuracies in the declared medications.  **Note: The testing scope of this panel includes these medications:  Alprazolam (Xanax)  Hydrocodone (Hydrocodone-Acetaminophen)  **Note: The testing scope of this panel does not include following  reported medications:  Acetaminophen (  Hydrocodone-Acetaminophen)  Albuterol  Albuterol (Proventil)  Bupropion (Wellbutrin)  Duloxetine (Cymbalta)  Estradiol  Flecainide (Tambocor)  Gabapentin  Levothyroxine  Melatonin  Metformin  Metoprolol  Mirabegron (Myrbetriq)  Montelukast (Singulair)  Pantoprazole (Protonix)  Quinapril ==================================================================== For clinical consultation, please call [phone]. ====================================================================    UDS interpretation: Compliant          Medication Assessment Form: Reviewed. Patient indicates being compliant with therapy Treatment compliance: Compliant Risk Assessment Profile: Aberrant behavior: See prior evaluations. None observed or detected today Comorbid factors increasing risk of overdose: See prior notes. No additional risks detected today Risk of substance use disorder (SUD): Low Opioid Risk Tool (ORT) Total Score: 4  Interpretation Table:  Score <3 = Low Risk for SUD  Score between 4-7 = Moderate Risk for SUD  Score >8 = High Risk for Opioid Abuse   Risk Mitigation Strategies:  Patient Counseling: Covered Patient-Prescriber Agreement (PPA): Present and active  Notification to other healthcare providers: Done  Pharmacologic Plan: No change in therapy, at this time  Laboratory Chemistry  Inflammation Markers Lab Results  Component Value Date   ESRSEDRATE 12 04/17/2015   CRP 0.6 04/17/2015   Renal Function Lab Results  Component Value Date   BUN 15 10/11/2015   CREATININE 0.60 10/11/2015   GFRAA >60 10/11/2015   GFRNONAA >60 10/11/2015   Hepatic Function Lab Results  Component Value Date   AST 14 (L) 08/05/2015   ALT 14 08/05/2015   ALBUMIN 3.8 08/05/2015   Electrolytes Lab Results  Component Value Date   NA 137 10/11/2015   K 4.0 10/11/2015   CL 102 10/11/2015   CALCIUM 8.4 (L) 10/11/2015   MG 1.8 04/17/2015   Pain Modulating Vitamins No results found for: Maralyn Sago [id number], [id number], [id number], 25OHVITD1, 25OHVITD2, 25OHVITD3, VITAMINB12 Coagulation Parameters Lab Results  Component Value Date   INR 1.18 09/26/2015   LABPROT 15.2 (H) 09/26/2015   APTT 42 (H) 06/24/2015   PLT 396.0 11/06/2015   Cardiovascular Lab Results  Component Value Date   BNP 157.0 (H) 10/09/2015   HGB 11.4 (L) 11/06/2015   HCT 35.1 (L) 11/06/2015   Note: Lab results  reviewed.  Recent Diagnostic Imaging Review  No results found. Note: Imaging results reviewed.          Meds  The patient has a current medication list which includes the following prescription(s): acetaminophen, albuterol, albuterol, alprazolam, bupropion, duloxetine, estrace vaginal, flecainide, fluticasone-salmeterol, hydrocodone-acetaminophen, hydrocodone-acetaminophen, hydrocodone-acetaminophen, levothyroxine, melatonin, meloxicam, metformin, metoprolol tartrate, mirabegron er, montelukast, oxybutynin, pantoprazole, pradaxa, quinapril, and gabapentin.  Current Outpatient Prescriptions on File Prior to Visit  Medication Sig  . acetaminophen (TYLENOL) 325 MG tablet Take 650 mg by mouth 2 (two) times daily.   Marland Kitchen albuterol (PROVENTIL HFA;VENTOLIN HFA) 108 (90 Base) MCG/ACT inhaler Inhale 1-2 puffs into the lungs every 4 (four) hours as needed for wheezing or shortness of breath.  Marland Kitchen albuterol (PROVENTIL) (2.5 MG/3ML) 0.083% nebulizer solution Take 3 mLs (2.5 mg total) by nebulization every 6 (six) hours as needed for wheezing or shortness of breath.  . ALPRAZolam (XANAX) 0.5 MG tablet TAKE ONE TABLET TWICE DAILY AS NEEDED  . buPROPion (WELLBUTRIN XL) 300 MG 24 hr tablet TAKE ONE TABLET BY MOUTH EVERY DAY  . ESTRACE VAGINAL 0.1 MG/GM vaginal cream INSERT 1 GRAM VAGINALLY TWICE A WEEK  . fluticasone-salmeterol (ADVAIR HFA) 115-21 MCG/ACT inhaler Inhale 2 puffs into the lungs 2 (two) times daily.  Marland Kitchen levothyroxine (SYNTHROID, LEVOTHROID) 150 MCG tablet TAKE ONE TABLET BY MOUTH  EVERY MORNING  . Melatonin 10 MG CAPS Take 20 mg by mouth at bedtime as needed.  . meloxicam (MOBIC) 15 MG tablet Take 15 mg by mouth daily.   . metFORMIN (GLUCOPHAGE) 1000 MG tablet One pill by mouth every morning about 15 minutes before morning meal, and two pills 15 minutes before evening meal (Patient taking differently: Take 1,000 mg by mouth daily with breakfast. 500 mg Metformin daily  before supper)  . metoprolol  tartrate (LOPRESSOR) 25 MG tablet TAKE ONE TABLET TWICE DAILY  . mirabegron ER (MYRBETRIQ) 25 MG TB24 tablet Take 1 tablet (25 mg total) by mouth daily.  . montelukast (SINGULAIR) 10 MG tablet TAKE 1 TABLET BY MOUTH DAILY  . oxybutynin (DITROPAN-XL) 10 MG 24 hr tablet Take 1 tablet (10 mg total) by mouth daily.  . pantoprazole (PROTONIX) 40 MG tablet TAKE ONE TABLET TWICE DAILY AS NEEDED  . PRADAXA 150 MG CAPS capsule TAKE 1 CAPSULE TWICE DAILY  . quinapril (ACCUPRIL) 20 MG tablet Take 2 tablets (40 mg total) by mouth daily.   No current facility-administered medications on file prior to visit.    ROS  Constitutional: Denies any fever or chills Gastrointestinal: No reported hemesis, hematochezia, vomiting, or acute GI distress Musculoskeletal: Denies any acute onset joint swelling, redness, loss of ROM, or weakness Neurological: No reported episodes of acute onset apraxia, aphasia, dysarthria, agnosia, amnesia, paralysis, loss of coordination, or loss of consciousness  Allergies  Ms. Tremper is allergic to aspirin; penicillins; and terramycin [oxytetracycline].  [place]  Drug: Ms. Thorson  reports that she does not use drugs. Alcohol:  reports that she does not drink alcohol. Tobacco:  reports that she has never smoked. She has never used smokeless tobacco. Medical:  has a past medical history of Adrenal mass ([place]); Anxiety; Anxiety and depression; Arthritis; Asthma; Bronchitis; Cystocele; Depression; Diabetes ([place]); DVT (deep venous thrombosis) ([place]); Dysthymic disorder; GERD (gastroesophageal reflux disease); Heart murmur; History of IBS; Hypothyroidism; Insomnia; Kidney disease; Kidney stone; Neuropathy involving both lower extremities; Obesity, Class II, BMI 35-39.9, with comorbidity; Paroxysmal atrial fibrillation ([place]) (2007); Peripheral vascular disease ([place]); Sleep apnea; Thromboembolic deterrent (TED) stockings in place on both lower extremities; and Venous stasis dermatitis of both  lower extremities. Family: family history includes Alcohol abuse in her father; Arthritis in her maternal grandfather, maternal grandmother, mother, paternal grandfather, and paternal grandmother; Breast cancer in her maternal aunt; Cancer in her father and maternal aunt; Cerebral aneurysm in her father; Diabetes in her father, maternal grandmother, and paternal grandmother; Heart disease in her maternal grandfather; Hypertension in her father, maternal grandfather, maternal grandmother, paternal grandfather, and paternal grandmother; Kidney disease in her mother; Mental illness in her sister; Peripheral vascular disease in her father; Skin cancer in her father; Varicose Veins in her mother.  Past Surgical History:  Procedure Laterality Date  . ANKLE SURGERY Right   . APPLICATION OF WOUND VAC Left 06/27/2015   Procedure: APPLICATION OF WOUND VAC ( POSSIBLE ) ;  Surgeon: [name], MD;  Location: [facility] ORS;  Service: Vascular;  Laterality: Left;  . CHOLECYSTECTOMY    . HERNIA REPAIR    . HIATAL HERNIA REPAIR    . I&D EXTREMITY Left 06/27/2015   Procedure: IRRIGATION AND DEBRIDEMENT EXTREMITY            ( CALF HEMATOMA ) POSSIBLE WOUND VAC;  Surgeon: [name], MD;  Location: [facility] ORS;  Service: Vascular;  Laterality: Left;  . JOINT REPLACEMENT     left knee  replacement  . KIDNEY SURGERY    . LITHOTRIPSY    . [facility] HX)  February 2017   Likely breast attenuation. LOW RISK study. Normal EF 55-60%.  . removal of left hematoma    . REVERSE SHOULDER ARTHROPLASTY Right 10/08/2015   Procedure: REVERSE SHOULDER ARTHROPLASTY;  Surgeon: [name], MD;  Location: [facility] ORS;  Service: Orthopedics;  Laterality: Right;  . SLEEVE GASTROPLASTY    . TONSILLECTOMY    . TOTAL KNEE ARTHROPLASTY    . TRANSESOPHAGEAL ECHOCARDIOGRAM  08/08/2013   Mild LVH, EF 60-65%. Moderate LA dilation and mild RA dilation. Mild MR with no evidence of stenosis and no evidence of endocarditis. A false chordae is  noted.  . TRANSTHORACIC ECHOCARDIOGRAM  08/03/2013   Mild-Moderate concentric LVH, EF 60-65%. Normal diastolic function. Mild LA dilation. Mild MR with possible vegetation  - not confirmed on TEE   . VAGINAL HYSTERECTOMY     Constitutional Exam  General appearance: Well nourished, well developed, and well hydrated. In no apparent acute distress Vitals:   02/25/16 1111  BP: (!) 190/75  Pulse: (!) 59  Resp: 16  Temp: 98.4 F (36.9 C)  TempSrc: Oral  SpO2: 98%  Weight: 247 lb (112 kg)  Height: '5\' 4"'  (1.626 m)   BMI Assessment: Estimated body mass index is 42.4 kg/m as calculated from the following:   Height as of this encounter: '5\' 4"'  (1.626 m).   Weight as of this encounter: 247 lb (112 kg).  BMI interpretation table: BMI level Category Range association with higher incidence of chronic pain  <18 kg/m2 Underweight   18.5-24.9 kg/m2 Ideal body weight   25-29.9 kg/m2 Overweight Increased incidence by 20%  30-34.9 kg/m2 Obese (Class I) Increased incidence by 68%  35-39.9 kg/m2 Severe obesity (Class II) Increased incidence by 136%  >40 kg/m2 Extreme obesity (Class III) Increased incidence by 254%   BMI Readings from Last 4 Encounters:  02/25/16 42.40 kg/m  01/27/16 41.93 kg/m  12/26/15 43.60 kg/m  12/18/15 43.26 kg/m   Wt Readings from Last 4 Encounters:  02/25/16 247 lb (112 kg)  01/27/16 252 lb (114.3 kg)  12/26/15 262 lb (118.8 kg)  12/18/15 252 lb (114.3 kg)  Psych/Mental status: Alert, oriented x 3 (person, place, & time) Eyes: PERLA Respiratory: No evidence of acute respiratory distress  Cervical Spine Exam  Inspection: No masses, redness, or swelling Alignment: Symmetrical Functional ROM: Unrestricted ROM Stability: No instability detected Muscle strength & Tone: Functionally intact Sensory: Unimpaired Palpation: Non-contributory  Upper Extremity (UE) Exam    Side: Right upper extremity  Side: Left upper extremity  Inspection: No masses, redness,  swelling, or asymmetry  Inspection: No masses, redness, swelling, or asymmetry  Functional ROM: Unrestricted ROM          Functional ROM: Unrestricted ROM          Muscle strength & Tone: Functionally intact  Muscle strength & Tone: Functionally intact  Sensory: Unimpaired  Sensory: Unimpaired  Palpation: Non-contributory  Palpation: Non-contributory   Thoracic Spine Exam  Inspection: No masses, redness, or swelling Alignment: Symmetrical Functional ROM: Unrestricted ROM Stability: No instability detected Sensory: Unimpaired Muscle strength & Tone: Functionally intact Palpation: Non-contributory  Lumbar Spine Exam  Inspection: No masses, redness, or swelling Alignment: Symmetrical Functional ROM: Unrestricted ROM Stability: No instability detected Muscle strength & Tone: Functionally intact Sensory: Unimpaired Palpation: Non-contributory Provocative Tests: Lumbar Hyperextension and rotation test: evaluation deferred today       Patrick's Maneuver:  evaluation deferred today              Gait & Posture Assessment  Ambulation: Unassisted Gait: Relatively normal for age and body habitus Posture: WNL   Lower Extremity Exam    Side: Right lower extremity  Side: Left lower extremity  Inspection: No masses, redness, swelling, or asymmetry  Inspection: No masses, redness, swelling, or asymmetry  Functional ROM: Unrestricted ROM          Functional ROM: Unrestricted ROM          Muscle strength & Tone: Functionally intact  Muscle strength & Tone: Functionally intact  Sensory: Unimpaired  Sensory: Unimpaired  Palpation: Non-contributory  Palpation: Non-contributory   Assessment  Primary Diagnosis & Pertinent Problem List: The primary encounter diagnosis was Chronic pain syndrome. Diagnoses of Diabetic peripheral neuropathy associated with type 2 diabetes mellitus ([place]), Long term current use of opiate analgesic, Long term prescription opiate use, Opiate use (15 MME/Day), Depression  with anxiety, and Chronic right shoulder pain were also pertinent to this visit.  Visit Diagnosis: 1. Chronic pain syndrome   2. Diabetic peripheral neuropathy associated with type 2 diabetes mellitus ([place])   3. Long term current use of opiate analgesic   4. Long term prescription opiate use   5. Opiate use (15 MME/Day)   6. Depression with anxiety   7. Chronic right shoulder pain    Plan of Care  Pharmacotherapy (Medications Ordered): Meds ordered this encounter  Medications  . HYDROcodone-acetaminophen (NORCO/VICODIN) 5-325 MG tablet    Sig: Take 1 tablet by mouth every 8 (eight) hours as needed for severe pain.    Dispense:  90 tablet    Refill:  0    Do not add this medication to the electronic "Automatic Refill" notification system. Patient may have prescription filled one day early if pharmacy is closed on scheduled refill date. Do not fill until: 04/16/16 To last until: 05/16/16  . HYDROcodone-acetaminophen (NORCO/VICODIN) 5-325 MG tablet    Sig: Take 1 tablet by mouth every 8 (eight) hours as needed for severe pain.    Dispense:  90 tablet    Refill:  0    Do not add this medication to the electronic "Automatic Refill" notification system. Patient may have prescription filled one day early if pharmacy is closed on scheduled refill date. Do not fill until: 05/16/16 To last until: 06/15/16  . HYDROcodone-acetaminophen (NORCO/VICODIN) 5-325 MG tablet    Sig: Take 1 tablet by mouth every 8 (eight) hours as needed for severe pain.    Dispense:  90 tablet    Refill:  0    Do not add this medication to the electronic "Automatic Refill" notification system. Patient may have prescription filled one day early if pharmacy is closed on scheduled refill date. Do not fill until: 03/17/16 To last until: 04/16/16  . DULoxetine (CYMBALTA) 60 MG capsule    Sig: Take 1 capsule (60 mg total) by mouth daily.    Dispense:  30 capsule    Refill:  2    Do not place this medication, or any  other prescription from our practice, on "Automatic Refill". Patient may have prescription filled one day early if pharmacy is closed on scheduled refill date.  . gabapentin (NEURONTIN) 800 MG tablet    Sig: Take 1 tablet (800 mg total) by mouth 4 (four) times daily.    Dispense:  90 tablet    Refill:  0    Do not place this medication, or  any other prescription from our practice, on "Automatic Refill". Patient may have prescription filled one day early if pharmacy is closed on scheduled refill date.   New Prescriptions   GABAPENTIN (NEURONTIN) 800 MG TABLET    Take 1 tablet (800 mg total) by mouth 4 (four) times daily.   Medications administered today: Ms. Delatte had no medications administered during this visit. Lab-work, procedure(s), and/or referral(s): Orders Placed This Encounter  Procedures  . SUPRASCAPULAR NERVE BLOCK   Imaging and/or referral(s): None  Interventional therapies: Planned, scheduled, and/or pending:   None at this time. (Always stop Pradaxa x 5 days prior to any blocks.)   Considering:   Diagnostic Right suprascapular nerve block under fluoro. No sedation. Diagnostic right intra-articular knee injection with local anesthetic and steroid under fluoroscopic guidance, no sedation. Possible series of 5 Hyalgan knee injections under fluoroscopic guidance, no sedation.  Diagnostic right-sided genicular nerve block under fluoroscopic guidance and IV sedation.  Possible right-sided genicular nerve radiofrequency ablation under fluoroscopic guidance and IV sedation.  Palliative right intra-articular shoulder joint injection with local anesthetic and steroid under fluoroscopic guidance, no sedation.  Diagnostic right-sided suprascapular nerve block under fluoroscopic guidance, with or without sedation.  Possible right-sided suprascapular nerve radiofrequency ablation under fluoroscopic guidance and IV sedation.  Palliative right lateral femoral cutaneous nerve under  fluoroscopic guidance, with or without sedation.  Palliative repeat right sided lateral femoral continuous nerve radiofrequency ablation under fluoroscopic guidance and IV sedation.    Palliative PRN treatment(s):   Diagnostic right intra-articular knee injection with local anesthetic and steroid under fluoroscopic guidance, no sedation.  Possible series of 5 Hyalgan knee injections under fluoroscopic guidance, no sedation.  Diagnostic right-sided genicular nerve block under fluoroscopic guidance and IV sedation.  Palliative right intra-articular shoulder joint injection with local anesthetic and steroid under fluoroscopic guidance, no sedation.  Diagnostic right-sided suprascapular nerve block under fluoroscopic guidance, with or without sedation.  Palliative right lateral femoral cutaneous nerve under fluoroscopic guidance, with or without sedation.  Palliative repeat right sided lateral femoral continuous nerve radiofrequency ablation under fluoroscopic guidance and IV sedation.    Provider-requested follow-up: Return in about 3 months (around 05/25/2016) for Med-Mgmt, in addition, (PRN) procedure.  Future Appointments Date Time Provider [place]  03/05/2016 11:15 AM Wellington Hampshire, MD [facility]  04/28/2016 2:30 PM [name], DO [facility] None  12/03/2016 2:45 PM Nori Riis, PA-C [facility] None   Primary Care Physician: [name], DO Location: [facility] Outpatient Pain Management Facility Note by: Kathlen Brunswick Dossie Arbour, M.D, DABA, DABAPM, DABPM, DABIPP, FIPP Date: 02/25/16; Time: 12:17 PM  Pain Score Disclaimer: We use the NRS-11 scale. This is a self-reported, subjective measurement of pain severity with only modest accuracy. It is used primarily to identify changes within a particular patient. It must be understood that outpatient pain scales are significantly less accurate that those used for research, where they can be applied under ideal controlled  circumstances with minimal exposure to variables. In reality, the score is likely to be a combination of pain intensity and pain affect, where pain affect describes the degree of emotional arousal or changes in action readiness caused by the sensory experience of pain. Factors such as social and work situation, setting, emotional state, anxiety levels, expectation, and prior pain experience may influence pain perception and show large inter-individual differences that may also be affected by time variables.  Patient instructions provided during this appointment: There are no Patient Instructions on file for this visit.

## 2016-02-25 NOTE — Progress Notes (Signed)
Nursing Pain Medication Assessment:  Safety precautions to be maintained throughout the outpatient stay will include: orient to surroundings, keep bed in low position, maintain call bell within reach at all times, provide assistance with transfer out of bed and ambulation.  Medication Inspection Compliance: [name] did not comply with our request to bring her pills to be counted. She was reminded that bringing the medication bottles, even when empty, is a requirement. Pill Count: No pills available to be counted today. Bottle Appearance: No container available. Did not bring bottle(s) to appointment. Medication: None brought in. Filled Date: N/A Medication last intake: 02/25/2016

## 2016-02-27 DIAGNOSIS — L97511 Non-pressure chronic ulcer of other part of right foot limited to breakdown of skin: Secondary | ICD-10-CM | POA: Diagnosis not present

## 2016-02-27 DIAGNOSIS — I739 Peripheral vascular disease, unspecified: Secondary | ICD-10-CM | POA: Diagnosis not present

## 2016-02-27 DIAGNOSIS — S90821A Blister (nonthermal), right foot, initial encounter: Secondary | ICD-10-CM | POA: Diagnosis not present

## 2016-03-04 ENCOUNTER — Encounter: Payer: PPO | Admitting: Pain Medicine

## 2016-03-05 ENCOUNTER — Ambulatory Visit (INDEPENDENT_AMBULATORY_CARE_PROVIDER_SITE_OTHER): Payer: PPO | Admitting: Cardiovascular Disease

## 2016-03-05 VITALS — BP 140/85 | HR 50 | Ht 66.0 in | Wt 254.2 lb

## 2016-03-05 DIAGNOSIS — I480 Paroxysmal atrial fibrillation: Secondary | ICD-10-CM | POA: Diagnosis not present

## 2016-03-05 DIAGNOSIS — G4733 Obstructive sleep apnea (adult) (pediatric): Secondary | ICD-10-CM | POA: Diagnosis not present

## 2016-03-05 DIAGNOSIS — I10 Essential (primary) hypertension: Secondary | ICD-10-CM | POA: Diagnosis not present

## 2016-03-05 NOTE — Progress Notes (Signed)
Cardiology Office Note   Date:  03/05/2016   ID:  [name], DOB [birth year], MRN [id number]  PCP:  [name], DO  Cardiologist:   Kathlyn Sacramento, MD   Chief Complaint  Patient presents with  . other    66mo f/u. Dr. [name] pt. Pt states she had a fall yesterday and is sore. Pt also c/o SOB and states she has a new CPAP machine today. Reviewed meds with pt verbally.      History of Present Illness: DIKSHA FEO is a 65 y.o. female who presents for a follow-up visit regarding paroxysmal atrial fibrillation. She was previously followed by Dr. [name]. No previous history of ischemic heart disease.She had a nuclear stress test in February 2017 which was normal. She has chronic medical conditions that include diabetes mellitus, hypertension, hyperlipidemia, obesity and previous DVT. She also has sleep apnea and has been on CPAP for many years. She problems with orthopnea and hypoxia after shoulder surgery last summer and she is about to get a new CPAP machine. She has been doing reasonably well overall. She reports occasional palpitations but no prolonged tachycardia. She denies any chest pain. She has stable exertional dyspnea.    Past Medical History:  Diagnosis Date  . Adrenal mass ([place])   . Anxiety   . Anxiety and depression   . Arthritis   . Asthma   . Bronchitis   . Cystocele   . Depression   . Diabetes ([place])   . DVT (deep venous thrombosis) ([facility])    Righ calf  . Dysthymic disorder   . GERD (gastroesophageal reflux disease)   . Heart murmur    Echocardiogram June 2015: Mild MR (possible vegetation seen on TTE, not seen on TEE), normal LV size with moderate concentric LVH. Normal function EF 60-65%. Normal diastolic function. Mild LA dilation.  Marland Kitchen History of IBS   . Hypothyroidism   . Insomnia   . Kidney disease   . Kidney stone   . Neuropathy involving both lower extremities   . Obesity, Class II, BMI 35-39.9, with comorbidity   . Paroxysmal atrial  fibrillation ([place]) 2007   In June 2015, Cardiac Event Monitor: Mostly SR/sinus arrhythmia with PVCs that are frequent. Short bursts of A. fib lasting several minutes;; CHA2DS2-VASc Score = 5 (age, Female, PVD, DM, HTN)  . Peripheral vascular disease ([place])   . Sleep apnea    use C-PAP  . Thromboembolic deterrent (TED) stockings in place on both lower extremities   . Venous stasis dermatitis of both lower extremities     Past Surgical History:  Procedure Laterality Date  . ANKLE SURGERY Right   . APPLICATION OF WOUND VAC Left 06/27/2015   Procedure: APPLICATION OF WOUND VAC ( POSSIBLE ) ;  Surgeon: [name], MD;  Location: [facility] ORS;  Service: Vascular;  Laterality: Left;  . CHOLECYSTECTOMY    . HERNIA REPAIR    . HIATAL HERNIA REPAIR    . I&D EXTREMITY Left 06/27/2015   Procedure: IRRIGATION AND DEBRIDEMENT EXTREMITY            ( CALF HEMATOMA ) POSSIBLE WOUND VAC;  Surgeon: [name], MD;  Location: [facility] ORS;  Service: Vascular;  Laterality: Left;  . JOINT REPLACEMENT     left knee replacement  . KIDNEY SURGERY    . LITHOTRIPSY    . NM [facility] HX)  February 2017   Likely breast attenuation. LOW RISK study. Normal EF  55-60%.  . removal of left hematoma    . REVERSE SHOULDER ARTHROPLASTY Right 10/08/2015   Procedure: REVERSE SHOULDER ARTHROPLASTY;  Surgeon: [name], MD;  Location: [facility] ORS;  Service: Orthopedics;  Laterality: Right;  . SLEEVE GASTROPLASTY    . TONSILLECTOMY    . TOTAL KNEE ARTHROPLASTY    . TRANSESOPHAGEAL ECHOCARDIOGRAM  08/08/2013   Mild LVH, EF 60-65%. Moderate LA dilation and mild RA dilation. Mild MR with no evidence of stenosis and no evidence of endocarditis. A false chordae is noted.  . TRANSTHORACIC ECHOCARDIOGRAM  08/03/2013   Mild-Moderate concentric LVH, EF 60-65%. Normal diastolic function. Mild LA dilation. Mild MR with possible vegetation  - not confirmed on TEE   . VAGINAL HYSTERECTOMY       Current Outpatient Prescriptions    Medication Sig Dispense Refill  . acetaminophen (TYLENOL) 325 MG tablet Take 650 mg by mouth 2 (two) times daily.     Marland Kitchen albuterol (PROVENTIL HFA;VENTOLIN HFA) 108 (90 Base) MCG/ACT inhaler Inhale 1-2 puffs into the lungs every 4 (four) hours as needed for wheezing or shortness of breath. 8 g 2  . albuterol (PROVENTIL) (2.5 MG/3ML) 0.083% nebulizer solution Take 3 mLs (2.5 mg total) by nebulization every 6 (six) hours as needed for wheezing or shortness of breath. 150 mL 1  . ALPRAZolam (XANAX) 0.5 MG tablet TAKE ONE TABLET TWICE DAILY AS NEEDED 60 tablet 5  . buPROPion (WELLBUTRIN XL) 300 MG 24 hr tablet TAKE ONE TABLET BY MOUTH EVERY DAY 30 tablet 5  . [START ON 03/17/2016] DULoxetine (CYMBALTA) 60 MG capsule Take 1 capsule (60 mg total) by mouth daily. 30 capsule 2  . ESTRACE VAGINAL 0.1 MG/GM vaginal cream INSERT 1 GRAM VAGINALLY TWICE A WEEK 42.5 g 6  . fluticasone-salmeterol (ADVAIR HFA) 115-21 MCG/ACT inhaler Inhale 2 puffs into the lungs 2 (two) times daily. 1 Inhaler 12  . [START ON 03/17/2016] gabapentin (NEURONTIN) 800 MG tablet Take 1 tablet (800 mg total) by mouth 4 (four) times daily. 90 tablet 0  . [START ON 04/16/2016] HYDROcodone-acetaminophen (NORCO/VICODIN) 5-325 MG tablet Take 1 tablet by mouth every 8 (eight) hours as needed for severe pain. 90 tablet 0  . [START ON 05/16/2016] HYDROcodone-acetaminophen (NORCO/VICODIN) 5-325 MG tablet Take 1 tablet by mouth every 8 (eight) hours as needed for severe pain. 90 tablet 0  . [START ON 03/17/2016] HYDROcodone-acetaminophen (NORCO/VICODIN) 5-325 MG tablet Take 1 tablet by mouth every 8 (eight) hours as needed for severe pain. 90 tablet 0  . levothyroxine (SYNTHROID, LEVOTHROID) 150 MCG tablet TAKE ONE TABLET BY MOUTH EVERY MORNING 90 tablet 1  . Melatonin 10 MG CAPS Take 20 mg by mouth at bedtime as needed. 60 capsule 2  . meloxicam (MOBIC) 15 MG tablet Take 15 mg by mouth daily.     . metFORMIN (GLUCOPHAGE) 1000 MG tablet One pill by  mouth every morning about 15 minutes before morning meal, and two pills 15 minutes before evening meal (Patient taking differently: Take 1,000 mg by mouth daily with breakfast. 500 mg Metformin daily  before supper) 90 tablet 3  . metoprolol tartrate (LOPRESSOR) 25 MG tablet TAKE ONE TABLET TWICE DAILY 60 tablet 0  . mirabegron ER (MYRBETRIQ) 25 MG TB24 tablet Take 1 tablet (25 mg total) by mouth daily. 30 tablet 12  . montelukast (SINGULAIR) 10 MG tablet TAKE 1 TABLET BY MOUTH DAILY 90 tablet 0  . oxybutynin (DITROPAN-XL) 10 MG 24 hr tablet Take 1 tablet (10 mg total) by  mouth daily. 30 tablet 12  . pantoprazole (PROTONIX) 40 MG tablet TAKE ONE TABLET TWICE DAILY AS NEEDED 180 tablet 2  . PRADAXA 150 MG CAPS capsule TAKE 1 CAPSULE TWICE DAILY 180 capsule 1  . quinapril (ACCUPRIL) 20 MG tablet Take 2 tablets (40 mg total) by mouth daily. 90 tablet 1   No current facility-administered medications for this visit.     Allergies:   Aspirin; Penicillins; and Terramycin [oxytetracycline]    Social History:  The patient  reports that she has never smoked. She has never used smokeless tobacco. She reports that she does not drink alcohol or use drugs.   Family History:  The patient's family history includes Alcohol abuse in her father; Arthritis in her maternal grandfather, maternal grandmother, mother, paternal grandfather, and paternal grandmother; Breast cancer in her maternal aunt; Cancer in her father and maternal aunt; Cerebral aneurysm in her father; Diabetes in her father, maternal grandmother, and paternal grandmother; Heart disease in her maternal grandfather; Hypertension in her father, maternal grandfather, maternal grandmother, paternal grandfather, and paternal grandmother; Kidney disease in her mother; Mental illness in her sister; Peripheral vascular disease in her father; Skin cancer in her father; Varicose Veins in her mother.    ROS:  Please see the history of present illness.    Otherwise, review of systems are positive for none.   All other systems are reviewed and negative.    PHYSICAL EXAM: VS:  BP 140/85 (BP Location: Left Arm, Patient Position: Sitting, Cuff Size: Normal)   Pulse (!) 50   Ht 5\' 6"  (1.676 m)   Wt 254 lb 4 oz (115.3 kg)   BMI 41.04 kg/m  , BMI Body mass index is 41.04 kg/m. GEN: Well nourished, well developed, in no acute distress  HEENT: normal  Neck: no JVD, carotid bruits, or masses Cardiac: RRR; no murmurs, rubs, or gallops,no edema  Respiratory:  clear to auscultation bilaterally, normal work of breathing GI: soft, nontender, nondistended, + BS MS: no deformity or atrophy  Skin: warm and dry, no rash Neuro:  Strength and sensation are intact Psych: euthymic mood, full affect   EKG:  EKG is ordered today. The ekg ordered today demonstrates sinus rhythm with sinus arrhythmia and first-degree AV block.   Recent Labs: 04/17/2015: Magnesium 1.8 08/05/2015: ALT 14; TSH 1.565 10/09/2015: B Natriuretic Peptide 157.0 10/11/2015: BUN 15; Creatinine, Ser 0.60; Potassium 4.0; Sodium 137 11/06/2015: Hemoglobin 11.4; Platelets 396.0    Lipid Panel    Component Value Date/Time   CHOL 173 08/05/2015 1058   CHOL 185 10/10/2014 0822   TRIG 50 08/05/2015 1058   HDL 97 08/05/2015 1058   HDL 94 10/10/2014 0822   CHOLHDL 1.8 08/05/2015 1058   VLDL 10 08/05/2015 1058   LDLCALC 66 08/05/2015 1058   LDLCALC 73 10/10/2014 0822      Wt Readings from Last 3 Encounters:  03/05/16 254 lb 4 oz (115.3 kg)  02/25/16 247 lb (112 kg)  01/27/16 252 lb (114.3 kg)       No flowsheet data found.    ASSESSMENT AND PLAN:  1.  Paroxysmal atrial fibrillation: Currently in normal sinus rhythm with minimal palpitations. Continue metoprolol 25 mg twice daily. She is tolerating anticoagulation with Pradaxa. She has normal renal function.  2. Essential hypertension: Blood pressure is borderline elevated. We can consider a thiazide diuretic if blood  pressure continues to be high.    Disposition:   FU with me in 6 months  Signed,  Rogue Jury  Fletcher Anon, MD  03/05/2016 11:58 AM    [facility]

## 2016-03-05 NOTE — Patient Instructions (Signed)
Medication Instructions: Continue same medications.   Labwork: None.   Procedures/Testing: None.   Follow-Up: 6 months with Dr. Valon Glasscock.   Any Additional Special Instructions Will Be Listed Below (If Applicable).     If you need a refill on your cardiac medications before your next appointment, please call your pharmacy.   

## 2016-03-10 ENCOUNTER — Ambulatory Visit: Payer: PPO

## 2016-03-13 ENCOUNTER — Other Ambulatory Visit: Payer: Self-pay | Admitting: Family Medicine

## 2016-03-13 NOTE — Telephone Encounter (Signed)
Pt last refill of levothyroxine is on 12/20/15. Pt last OV was 01/27/16, last labs in house were 10/10/14.

## 2016-03-13 NOTE — Telephone Encounter (Signed)
Pt last refill on levothyroxine was on 12/20/15, pt last OV was on 02/26/16 and last in house labs were in 09/2014. PT has scheduled appt on 04/28/16. Ok to refill?

## 2016-03-23 DIAGNOSIS — J986 Disorders of diaphragm: Secondary | ICD-10-CM

## 2016-03-23 HISTORY — DX: Disorders of diaphragm: J98.6

## 2016-03-26 ENCOUNTER — Other Ambulatory Visit: Payer: Self-pay | Admitting: Family Medicine

## 2016-03-31 DIAGNOSIS — D2372 Other benign neoplasm of skin of left lower limb, including hip: Secondary | ICD-10-CM | POA: Diagnosis not present

## 2016-03-31 DIAGNOSIS — B351 Tinea unguium: Secondary | ICD-10-CM | POA: Diagnosis not present

## 2016-03-31 DIAGNOSIS — L97511 Non-pressure chronic ulcer of other part of right foot limited to breakdown of skin: Secondary | ICD-10-CM | POA: Diagnosis not present

## 2016-04-05 DIAGNOSIS — G4733 Obstructive sleep apnea (adult) (pediatric): Secondary | ICD-10-CM | POA: Diagnosis not present

## 2016-04-22 ENCOUNTER — Other Ambulatory Visit: Payer: Self-pay | Admitting: Family Medicine

## 2016-04-22 DIAGNOSIS — M25511 Pain in right shoulder: Secondary | ICD-10-CM | POA: Diagnosis not present

## 2016-04-22 DIAGNOSIS — Z96611 Presence of right artificial shoulder joint: Secondary | ICD-10-CM | POA: Diagnosis not present

## 2016-04-22 DIAGNOSIS — F329 Major depressive disorder, single episode, unspecified: Secondary | ICD-10-CM

## 2016-04-22 DIAGNOSIS — J4540 Moderate persistent asthma, uncomplicated: Secondary | ICD-10-CM

## 2016-04-22 DIAGNOSIS — F419 Anxiety disorder, unspecified: Secondary | ICD-10-CM

## 2016-04-23 ENCOUNTER — Other Ambulatory Visit: Payer: Self-pay | Admitting: Family Medicine

## 2016-04-24 DIAGNOSIS — R0609 Other forms of dyspnea: Secondary | ICD-10-CM | POA: Diagnosis not present

## 2016-04-24 DIAGNOSIS — G4733 Obstructive sleep apnea (adult) (pediatric): Secondary | ICD-10-CM | POA: Diagnosis not present

## 2016-04-24 DIAGNOSIS — J4520 Mild intermittent asthma, uncomplicated: Secondary | ICD-10-CM | POA: Diagnosis not present

## 2016-04-24 MED ORDER — ALPRAZOLAM 0.5 MG PO TABS
0.5000 mg | ORAL_TABLET | Freq: Two times a day (BID) | ORAL | 0 refills | Status: DC | PRN
Start: 2016-04-24 — End: 2016-06-16

## 2016-04-24 NOTE — Addendum Note (Signed)
Addended by: [name] on: 04/24/2016 04:32 PM   Modules accepted: Orders

## 2016-04-24 NOTE — Telephone Encounter (Signed)
Refilled on 01/20/16. Pt last seen 01/27/16. Please advise?

## 2016-04-24 NOTE — Telephone Encounter (Signed)
faxed

## 2016-04-24 NOTE — Telephone Encounter (Signed)
Xanax refilled- 09/20/15  singulair refilled- 02/17/16.  Pt last seen 01/27/16. Please advise?

## 2016-04-28 ENCOUNTER — Other Ambulatory Visit: Payer: Self-pay | Admitting: Family Medicine

## 2016-04-28 ENCOUNTER — Ambulatory Visit: Payer: PPO | Admitting: Family Medicine

## 2016-05-06 DIAGNOSIS — G4733 Obstructive sleep apnea (adult) (pediatric): Secondary | ICD-10-CM | POA: Diagnosis not present

## 2016-05-13 DIAGNOSIS — Z96611 Presence of right artificial shoulder joint: Secondary | ICD-10-CM | POA: Diagnosis not present

## 2016-05-13 DIAGNOSIS — G5621 Lesion of ulnar nerve, right upper limb: Secondary | ICD-10-CM | POA: Diagnosis not present

## 2016-05-13 DIAGNOSIS — G5601 Carpal tunnel syndrome, right upper limb: Secondary | ICD-10-CM | POA: Diagnosis not present

## 2016-05-13 DIAGNOSIS — M25511 Pain in right shoulder: Secondary | ICD-10-CM | POA: Diagnosis not present

## 2016-05-19 ENCOUNTER — Ambulatory Visit: Payer: PPO | Attending: Pain Medicine | Admitting: Pain Medicine

## 2016-05-19 ENCOUNTER — Encounter: Payer: Self-pay | Admitting: Pain Medicine

## 2016-05-19 VITALS — BP 182/71 | HR 60 | Temp 98.0°F | Resp 18 | Ht 65.0 in | Wt 260.0 lb

## 2016-05-19 DIAGNOSIS — I480 Paroxysmal atrial fibrillation: Secondary | ICD-10-CM | POA: Diagnosis not present

## 2016-05-19 DIAGNOSIS — M159 Polyosteoarthritis, unspecified: Secondary | ICD-10-CM

## 2016-05-19 DIAGNOSIS — G5711 Meralgia paresthetica, right lower limb: Secondary | ICD-10-CM | POA: Insufficient documentation

## 2016-05-19 DIAGNOSIS — J4540 Moderate persistent asthma, uncomplicated: Secondary | ICD-10-CM | POA: Insufficient documentation

## 2016-05-19 DIAGNOSIS — E1142 Type 2 diabetes mellitus with diabetic polyneuropathy: Secondary | ICD-10-CM

## 2016-05-19 DIAGNOSIS — Z841 Family history of disorders of kidney and ureter: Secondary | ICD-10-CM | POA: Insufficient documentation

## 2016-05-19 DIAGNOSIS — Z7901 Long term (current) use of anticoagulants: Secondary | ICD-10-CM | POA: Insufficient documentation

## 2016-05-19 DIAGNOSIS — G894 Chronic pain syndrome: Secondary | ICD-10-CM | POA: Diagnosis not present

## 2016-05-19 DIAGNOSIS — M79671 Pain in right foot: Secondary | ICD-10-CM | POA: Diagnosis not present

## 2016-05-19 DIAGNOSIS — Z818 Family history of other mental and behavioral disorders: Secondary | ICD-10-CM | POA: Insufficient documentation

## 2016-05-19 DIAGNOSIS — M7581 Other shoulder lesions, right shoulder: Secondary | ICD-10-CM | POA: Diagnosis not present

## 2016-05-19 DIAGNOSIS — M150 Primary generalized (osteo)arthritis: Secondary | ICD-10-CM

## 2016-05-19 DIAGNOSIS — Z811 Family history of alcohol abuse and dependence: Secondary | ICD-10-CM | POA: Diagnosis not present

## 2016-05-19 DIAGNOSIS — Z79899 Other long term (current) drug therapy: Secondary | ICD-10-CM | POA: Diagnosis not present

## 2016-05-19 DIAGNOSIS — E039 Hypothyroidism, unspecified: Secondary | ICD-10-CM | POA: Diagnosis not present

## 2016-05-19 DIAGNOSIS — I890 Lymphedema, not elsewhere classified: Secondary | ICD-10-CM | POA: Insufficient documentation

## 2016-05-19 DIAGNOSIS — Z79891 Long term (current) use of opiate analgesic: Secondary | ICD-10-CM | POA: Insufficient documentation

## 2016-05-19 DIAGNOSIS — Z96611 Presence of right artificial shoulder joint: Secondary | ICD-10-CM | POA: Insufficient documentation

## 2016-05-19 DIAGNOSIS — Z6841 Body Mass Index (BMI) 40.0 and over, adult: Secondary | ICD-10-CM | POA: Diagnosis not present

## 2016-05-19 DIAGNOSIS — Z881 Allergy status to other antibiotic agents status: Secondary | ICD-10-CM | POA: Insufficient documentation

## 2016-05-19 DIAGNOSIS — Z8249 Family history of ischemic heart disease and other diseases of the circulatory system: Secondary | ICD-10-CM | POA: Insufficient documentation

## 2016-05-19 DIAGNOSIS — G4701 Insomnia due to medical condition: Secondary | ICD-10-CM | POA: Diagnosis not present

## 2016-05-19 DIAGNOSIS — Z886 Allergy status to analgesic agent status: Secondary | ICD-10-CM | POA: Insufficient documentation

## 2016-05-19 DIAGNOSIS — G8929 Other chronic pain: Secondary | ICD-10-CM

## 2016-05-19 DIAGNOSIS — F418 Other specified anxiety disorders: Secondary | ICD-10-CM | POA: Insufficient documentation

## 2016-05-19 DIAGNOSIS — K219 Gastro-esophageal reflux disease without esophagitis: Secondary | ICD-10-CM | POA: Insufficient documentation

## 2016-05-19 DIAGNOSIS — G4700 Insomnia, unspecified: Secondary | ICD-10-CM | POA: Insufficient documentation

## 2016-05-19 DIAGNOSIS — M8949 Other hypertrophic osteoarthropathy, multiple sites: Secondary | ICD-10-CM

## 2016-05-19 DIAGNOSIS — M25511 Pain in right shoulder: Secondary | ICD-10-CM | POA: Diagnosis not present

## 2016-05-19 DIAGNOSIS — M1990 Unspecified osteoarthritis, unspecified site: Secondary | ICD-10-CM | POA: Insufficient documentation

## 2016-05-19 DIAGNOSIS — Z8261 Family history of arthritis: Secondary | ICD-10-CM | POA: Insufficient documentation

## 2016-05-19 DIAGNOSIS — I10 Essential (primary) hypertension: Secondary | ICD-10-CM | POA: Insufficient documentation

## 2016-05-19 DIAGNOSIS — E1151 Type 2 diabetes mellitus with diabetic peripheral angiopathy without gangrene: Secondary | ICD-10-CM | POA: Insufficient documentation

## 2016-05-19 DIAGNOSIS — N3946 Mixed incontinence: Secondary | ICD-10-CM | POA: Insufficient documentation

## 2016-05-19 DIAGNOSIS — Z96652 Presence of left artificial knee joint: Secondary | ICD-10-CM | POA: Insufficient documentation

## 2016-05-19 DIAGNOSIS — I878 Other specified disorders of veins: Secondary | ICD-10-CM | POA: Insufficient documentation

## 2016-05-19 DIAGNOSIS — Z7984 Long term (current) use of oral hypoglycemic drugs: Secondary | ICD-10-CM | POA: Insufficient documentation

## 2016-05-19 DIAGNOSIS — F1190 Opioid use, unspecified, uncomplicated: Secondary | ICD-10-CM

## 2016-05-19 DIAGNOSIS — Z880 Allergy status to penicillin: Secondary | ICD-10-CM | POA: Diagnosis not present

## 2016-05-19 MED ORDER — HYDROCODONE-ACETAMINOPHEN 5-325 MG PO TABS: 1.0000 | ORAL_TABLET | Freq: Three times a day (TID) | ORAL | 0 refills | Status: DC | PRN

## 2016-05-19 MED ORDER — GABAPENTIN 800 MG PO TABS: 800.0000 mg | ORAL_TABLET | Freq: Four times a day (QID) | ORAL | 0 refills | Status: DC

## 2016-05-19 MED ORDER — DULOXETINE HCL 60 MG PO CPEP: 60.0000 mg | ORAL_CAPSULE | Freq: Every day | ORAL | 0 refills | Status: DC

## 2016-05-19 MED ORDER — MELATONIN 10 MG PO CAPS
20.0000 mg | ORAL_CAPSULE | Freq: Every evening | ORAL | 2 refills | Status: DC | PRN
Start: 2016-06-15 — End: 2016-08-18

## 2016-05-19 NOTE — Progress Notes (Signed)
Patient's Name: [name]  MRN: [id number]  Referring Provider: [name], DO  DOB: [birth year]  PCP: Dawn Spikes, DO  DOS: 05/19/2016  Note by: [name]. Dawn Arbour, MD  Service setting: Ambulatory outpatient  Specialty: Interventional Pain Management  Location: [facility] (AMB) Pain Management Facility    Patient type: Established   Primary Reason(s) for Visit: Encounter for prescription drug management (Level of risk: moderate) CC: Foot Pain (bilateral) and Hand Pain (right)  HPI  [name] is a 66 y.o. year old, female patient, who comes today for a medication management evaluation. She has Paroxysmal atrial fibrillation ([place]); CHA2DS2-Vasc Score 5. On Pradaxa; Hypertension goal BP (blood pressure) < 150/90; Peripheral vascular disease due to secondary diabetes mellitus ([place]); Apnea, sleep; DM (diabetes mellitus) type II controlled peripheral vascular disorder ([place]); Diabetic peripheral neuropathy associated with type 2 diabetes mellitus ([place]); Asthma, moderate persistent, well-controlled; Hypothyroidism, adult; Anxiety and depression; Bladder cystocele; Gastro-esophageal reflux disease without esophagitis; Mixed incontinence; Atrophic vaginitis; Myelolipoma of right adrenal gland; Opiate use (15 MME/Day); Lateral femoral cutaneous entrapment syndrome (Right); Other shoulder lesions (Right); Neuropathy of right lateral femoral cutaneous nerve; Morbid obesity with BMI of 40.0-44.9, adult ([place]); Chronic prescription benzodiazepine use; Osteoarthritis, multiple sites; Status post reverse total shoulder replacement; DOE (dyspnea on exertion); Lymphedema; Chronic pain syndrome; Long term current use of opiate analgesic; Long term prescription opiate use; Chronic right shoulder pain; and Rotator cuff tendinitis, right on her problem list. Her primarily concern today is the Foot Pain (bilateral) and Hand Pain (right)  Pain Assessment: Self-Reported Pain Score: 4 /10 Clinically the patient looks like a  2/10 Reported level is inconsistent with clinical observations. Information on the proper use of the pain scale provided to the patient today Pain Type: Chronic pain Pain Location: Foot Pain Orientation: Right, Left Pain Descriptors / Indicators: Burning, Numbness Pain Frequency: Intermittent  [name] was last scheduled for an appointment on 02/25/2016 for medication management. During today's appointment we reviewed [name]'s chronic pain status, as well as her outpatient medication regimen.  The patient  reports that she does not use drugs. Her body mass index is 43.27 kg/m.  Further details on both, my assessment(s), as well as the proposed treatment plan, please see below.  Controlled Substance Pharmacotherapy Assessment REMS (Risk Evaluation and Mitigation Strategy)  Analgesic:Hydrocodone/APAP 5/325 one tablet every 8 hours (15 mg/day) MME/day:15 mg/day Dawn Martins, RN  05/19/2016  1:55 PM  Sign at close encounter Nursing Pain Medication Assessment:  Safety precautions to be maintained throughout the outpatient stay will include: orient to surroundings, keep bed in low position, maintain call bell within reach at all times, provide assistance with transfer out of bed and ambulation.  Medication Inspection Compliance: Pill count conducted under aseptic conditions, in front of the patient. Neither the pills nor the bottle was removed from the patient's sight at any time. Once count was completed pills were immediately returned to the patient in their original bottle.  Medication: Hydrocodone/APAP Pill/Patch Count: 28 of 90 pills remain Bottle Appearance: Standard pharmacy container. Clearly labeled. Filled Date: 02 / 13 / 2018 Last Medication intake:  Today   Patient states she had 40 pills, believes a family member who has back pain may have taken it.   Pharmacokinetics: Liberation and absorption (onset of action): WNL Distribution (time to peak effect):  WNL Metabolism and excretion (duration of action): WNL         Pharmacodynamics: Desired effects: Analgesia: Ms. Hoppel reports >50% benefit. Functional  ability: Patient reports that medication allows her to accomplish basic ADLs Clinically meaningful improvement in function (CMIF): Sustained CMIF goals met Perceived effectiveness: Described as relatively effective, allowing for increase in activities of daily living (ADL) Undesirable effects: Side-effects or Adverse reactions: None reported Monitoring: [place] PMP: Online review of the past 26-monthperiod conducted. Compliant with practice rules and regulations List of all UDS test(s) done:  Lab Results  Component Value Date   TOXASSSELUR FINAL 09/17/2015   TOXASSSELUR FINAL 07/12/2015   TOXASSSELUR FINAL 04/17/2015   [place] 01/16/2015   Last UDS on record: ToxAssure Select 13  Date Value Ref Range Status  09/17/2015 FINAL  Final    Comment:    ==================================================================== TOXASSURE SELECT 13 (MW) ==================================================================== Test                             Result       Flag       Units Drug Absent but Declared for Prescription Verification   Alprazolam                     Not Detected UNEXPECTED ng/mg creat   Hydrocodone                    Not Detected UNEXPECTED ng/mg creat ==================================================================== Test                      Result    Flag   Units      Ref Range   Creatinine              116              mg/dL      >=20 ==================================================================== Declared Medications:  The flagging and interpretation on this report are based on the  following declared medications.  Unexpected results may arise from  inaccuracies in the declared medications.  **Note: The testing scope of this panel includes these medications:  Alprazolam (Xanax)  Hydrocodone  (Hydrocodone-Acetaminophen)  **Note: The testing scope of this panel does not include following  reported medications:  Acetaminophen (Hydrocodone-Acetaminophen)  Albuterol  Albuterol (Proventil)  Bupropion (Wellbutrin)  Duloxetine (Cymbalta)  Estradiol  Flecainide (Tambocor)  Gabapentin  Levothyroxine  Melatonin  Metformin  Metoprolol  Mirabegron (Myrbetriq)  Montelukast (Singulair)  Pantoprazole (Protonix)  Quinapril ==================================================================== For clinical consultation, please call [phone] ====================================================================    UDS interpretation: Compliant          Medication Assessment Form: Reviewed. Patient indicates being compliant with therapy Treatment compliance: Compliant Risk Assessment Profile: Aberrant behavior: See prior evaluations. None observed or detected today Comorbid factors increasing risk of overdose: See prior notes. No additional risks detected today Risk of substance use disorder (SUD): Low Opioid Risk Tool (ORT) Total Score: 3  Interpretation Table:  Score <3 = Low Risk for SUD  Score between 4-7 = Moderate Risk for SUD  Score >8 = High Risk for Opioid Abuse   Risk Mitigation Strategies:  Patient Counseling: Covered Patient-Prescriber Agreement (PPA): Present and active  Notification to other healthcare providers: Done  Pharmacologic Plan: No change in therapy, at this time  Laboratory Chemistry  Inflammation Markers Lab Results  Component Value Date   ESRSEDRATE 12 04/17/2015   CRP 0.6 04/17/2015   Renal Function Lab Results  Component Value Date   BUN 15 10/11/2015   CREATININE 0.60 10/11/2015   GFRAA >60 10/11/2015  GFRNONAA >60 10/11/2015   Hepatic Function Lab Results  Component Value Date   AST 14 (L) 08/05/2015   ALT 14 08/05/2015   ALBUMIN 3.8 08/05/2015   Electrolytes Lab Results  Component Value Date   NA 137 10/11/2015   K  4.0 10/11/2015   CL 102 10/11/2015   CALCIUM 8.4 (L) 10/11/2015   MG 1.8 04/17/2015   Pain Modulating Vitamins No results found for: Maralyn Sago [id number], [id number], [id number], 25OHVITD1, 25OHVITD2, 25OHVITD3, VITAMINB12 Coagulation Parameters Lab Results  Component Value Date   INR 1.18 09/26/2015   LABPROT 15.2 (H) 09/26/2015   APTT 42 (H) 06/24/2015   PLT 396.0 11/06/2015   Cardiovascular Lab Results  Component Value Date   BNP 157.0 (H) 10/09/2015   HGB 11.4 (L) 11/06/2015   HCT 35.1 (L) 11/06/2015   Note: Lab results reviewed.  Recent Diagnostic Imaging Review  No results found. Note: Imaging results reviewed.          Meds  The patient has a current medication list which includes the following prescription(s): acetaminophen, albuterol, albuterol, alprazolam, bupropion, dabigatran, duloxetine, estrace vaginal, fluticasone-salmeterol, gabapentin, hydrocodone-acetaminophen, hydrocodone-acetaminophen, hydrocodone-acetaminophen, levothyroxine, melatonin, meloxicam, metformin, metoprolol tartrate, mirabegron er, montelukast, oxybutynin, pantoprazole, and quinapril.  Current Outpatient Prescriptions on File Prior to Visit  Medication Sig  . acetaminophen (TYLENOL) 325 MG tablet Take 650 mg by mouth 2 (two) times daily.   Marland Kitchen albuterol (PROVENTIL HFA;VENTOLIN HFA) 108 (90 Base) MCG/ACT inhaler Inhale 1-2 puffs into the lungs every 4 (four) hours as needed for wheezing or shortness of breath.  Marland Kitchen albuterol (PROVENTIL) (2.5 MG/3ML) 0.083% nebulizer solution Take 3 mLs (2.5 mg total) by nebulization every 6 (six) hours as needed for wheezing or shortness of breath.  . ALPRAZolam (XANAX) 0.5 MG tablet Take 1 tablet (0.5 mg total) by mouth 2 (two) times daily as needed.  Marland Kitchen buPROPion (WELLBUTRIN XL) 300 MG 24 hr tablet TAKE 1 TABLET BY MOUTH DAILY  . dabigatran (PRADAXA) 150 MG CAPS capsule TAKE 1 CAPSULE BY MOUTH TWICE DAILY  . ESTRACE VAGINAL 0.1 MG/GM vaginal cream INSERT 1 GRAM  VAGINALLY TWICE A WEEK  . fluticasone-salmeterol (ADVAIR HFA) 115-21 MCG/ACT inhaler Inhale 2 puffs into the lungs 2 (two) times daily.  Marland Kitchen levothyroxine (SYNTHROID, LEVOTHROID) 150 MCG tablet TAKE ONE TABLET BY MOUTH EVERY MORNING  . meloxicam (MOBIC) 15 MG tablet Take 15 mg by mouth daily.   . metFORMIN (GLUCOPHAGE) 1000 MG tablet TAKE 1 TABLET BY MOUTH TWICE DAILY  . metoprolol tartrate (LOPRESSOR) 25 MG tablet TAKE ONE TABLET TWICE DAILY  . mirabegron ER (MYRBETRIQ) 25 MG TB24 tablet Take 1 tablet (25 mg total) by mouth daily.  . montelukast (SINGULAIR) 10 MG tablet TAKE ONE TABLET BY MOUTH EVERY DAY  . oxybutynin (DITROPAN-XL) 10 MG 24 hr tablet Take 1 tablet (10 mg total) by mouth daily.  . pantoprazole (PROTONIX) 40 MG tablet TAKE ONE TABLET TWICE DAILY AS NEEDED  . quinapril (ACCUPRIL) 20 MG tablet Take 2 tablets (40 mg total) by mouth daily.   No current facility-administered medications on file prior to visit.    ROS  Constitutional: Denies any fever or chills Gastrointestinal: No reported hemesis, hematochezia, vomiting, or acute GI distress Musculoskeletal: Denies any acute onset joint swelling, redness, loss of ROM, or weakness Neurological: No reported episodes of acute onset apraxia, aphasia, dysarthria, agnosia, amnesia, paralysis, loss of coordination, or loss of consciousness  Allergies  Ms. Basara is allergic to aspirin; penicillins; and terramycin [oxytetracycline].  PFSH  Drug: Ms.  Regnier  reports that she does not use drugs. Alcohol:  reports that she does not drink alcohol. Tobacco:  reports that she has never smoked. She has never used smokeless tobacco. Medical:  has a past medical history of Adrenal mass ([place]); Anxiety; Anxiety and depression; Arthritis; Asthma; Bronchitis; Cystocele; Depression; Diabetes ([place]); DVT (deep venous thrombosis) ([place]); Dysthymic disorder; GERD (gastroesophageal reflux disease); Heart murmur; History of IBS; Hypothyroidism; Insomnia;  Kidney disease; Kidney stone; Neuropathy involving both lower extremities; Obesity, Class II, BMI 35-39.9, with comorbidity; Paroxysmal atrial fibrillation ([place]) (2007); Peripheral vascular disease ([place]); Sleep apnea; Thromboembolic deterrent (TED) stockings in place on both lower extremities; and Venous stasis dermatitis of both lower extremities. Family: family history includes Alcohol abuse in her father; Arthritis in her maternal grandfather, maternal grandmother, mother, paternal grandfather, and paternal grandmother; Breast cancer in her maternal aunt; Cancer in her father and maternal aunt; Cerebral aneurysm in her father; Diabetes in her father, maternal grandmother, and paternal grandmother; Heart disease in her maternal grandfather; Hypertension in her father, maternal grandfather, maternal grandmother, paternal grandfather, and paternal grandmother; Kidney disease in her mother; Mental illness in her sister; Peripheral vascular disease in her father; Skin cancer in her father; Varicose Veins in her mother.  Past Surgical History:  Procedure Laterality Date  . ANKLE SURGERY Right   . APPLICATION OF WOUND VAC Left 06/27/2015   Procedure: APPLICATION OF WOUND VAC ( POSSIBLE ) ;  Surgeon: [name], MD;  Location: [facility] ORS;  Service: Vascular;  Laterality: Left;  . CHOLECYSTECTOMY    . HERNIA REPAIR    . HIATAL HERNIA REPAIR    . I&D EXTREMITY Left 06/27/2015   Procedure: IRRIGATION AND DEBRIDEMENT EXTREMITY            ( CALF HEMATOMA ) POSSIBLE WOUND VAC;  Surgeon: [name], MD;  Location: [facility] ORS;  Service: Vascular;  Laterality: Left;  . JOINT REPLACEMENT     left knee replacement  . KIDNEY SURGERY    . LITHOTRIPSY    . NM [facility] HX)  February 2017   Likely breast attenuation. LOW RISK study. Normal EF 55-60%.  . removal of left hematoma    . REVERSE SHOULDER ARTHROPLASTY Right 10/08/2015   Procedure: REVERSE SHOULDER ARTHROPLASTY;  Surgeon: [name], MD;  Location:  [facility] ORS;  Service: Orthopedics;  Laterality: Right;  . SLEEVE GASTROPLASTY    . TONSILLECTOMY    . TOTAL KNEE ARTHROPLASTY    . TRANSESOPHAGEAL ECHOCARDIOGRAM  08/08/2013   Mild LVH, EF 60-65%. Moderate LA dilation and mild RA dilation. Mild MR with no evidence of stenosis and no evidence of endocarditis. A false chordae is noted.  . TRANSTHORACIC ECHOCARDIOGRAM  08/03/2013   Mild-Moderate concentric LVH, EF 60-65%. Normal diastolic function. Mild LA dilation. Mild MR with possible vegetation  - not confirmed on TEE   . VAGINAL HYSTERECTOMY     Constitutional Exam  General appearance: Well nourished, well developed, and well hydrated. In no apparent acute distress Vitals:   05/19/16 1344  BP: (!) 182/71  Pulse: 60  Resp: 18  Temp: 98 F (36.7 C)  TempSrc: Oral  SpO2: 94%  Weight: 260 lb (117.9 kg)  Height: '5\' 5"'  (1.651 m)   BMI Assessment: Estimated body mass index is 43.27 kg/m as calculated from the following:   Height as of this encounter: '5\' 5"'  (1.651 m).   Weight as of this encounter: 260 lb (117.9 kg).  BMI interpretation table: BMI level  Category Range association with higher incidence of chronic pain  <18 kg/m2 Underweight   18.5-24.9 kg/m2 Ideal body weight   25-29.9 kg/m2 Overweight Increased incidence by 20%  30-34.9 kg/m2 Obese (Class I) Increased incidence by 68%  35-39.9 kg/m2 Severe obesity (Class II) Increased incidence by 136%  >40 kg/m2 Extreme obesity (Class III) Increased incidence by 254%   BMI Readings from Last 4 Encounters:  05/19/16 43.27 kg/m  03/05/16 41.04 kg/m  02/25/16 42.40 kg/m  01/27/16 41.93 kg/m   Wt Readings from Last 4 Encounters:  05/19/16 260 lb (117.9 kg)  03/05/16 254 lb 4 oz (115.3 kg)  02/25/16 247 lb (112 kg)  01/27/16 252 lb (114.3 kg)  Psych/Mental status: Alert, oriented x 3 (person, place, & time)       Eyes: PERLA Respiratory: No evidence of acute respiratory distress  Cervical Spine Exam  Inspection: No  masses, redness, or swelling Alignment: Symmetrical Functional ROM: Unrestricted ROM Stability: No instability detected Muscle strength & Tone: Functionally intact Sensory: Unimpaired Palpation: Non-contributory  Upper Extremity (UE) Exam    Side: Right upper extremity  Side: Left upper extremity  Inspection: No masses, redness, swelling, or asymmetry. No contractures  Inspection: No masses, redness, swelling, or asymmetry. No contractures  Functional ROM: Unrestricted ROM          Functional ROM: Unrestricted ROM          Muscle strength & Tone: Functionally intact  Muscle strength & Tone: Functionally intact  Sensory: Unimpaired  Sensory: Unimpaired  Palpation: Euthermic  Palpation: Euthermic  Specialized Test(s): Deferred         Specialized Test(s): Deferred          Thoracic Spine Exam  Inspection: No masses, redness, or swelling Alignment: Symmetrical Functional ROM: Unrestricted ROM Stability: No instability detected Sensory: Unimpaired Muscle strength & Tone: Functionally intact Palpation: Non-contributory  Lumbar Spine Exam  Inspection: No masses, redness, or swelling Alignment: Symmetrical Functional ROM: Unrestricted ROM Stability: No instability detected Muscle strength & Tone: Functionally intact Sensory: Unimpaired Palpation: Non-contributory Provocative Tests: Lumbar Hyperextension and rotation test: evaluation deferred today       Patrick's Maneuver: evaluation deferred today              Gait & Posture Assessment  Ambulation: Unassisted Gait: Relatively normal for age and body habitus Posture: WNL   Lower Extremity Exam    Side: Right lower extremity  Side: Left lower extremity  Inspection: No masses, redness, swelling, or asymmetry. No contractures  Inspection: No masses, redness, swelling, or asymmetry. No contractures  Functional ROM: Unrestricted ROM          Functional ROM: Unrestricted ROM          Muscle strength & Tone: Functionally intact   Muscle strength & Tone: Functionally intact  Sensory: Unimpaired  Sensory: Unimpaired  Palpation: No palpable anomalies  Palpation: No palpable anomalies   Assessment  Primary Diagnosis & Pertinent Problem List: The primary encounter diagnosis was Chronic pain syndrome. Diagnoses of Meralgia paresthetica of right side, Chronic right shoulder pain, Primary osteoarthritis involving multiple joints, Other shoulder lesions (Right), Long term current use of opiate analgesic, Opiate use (15 MME/Day), Depression with anxiety, Diabetic peripheral neuropathy associated with type 2 diabetes mellitus ([place]), and Insomnia secondary to chronic pain were also pertinent to this visit.  Status Diagnosis  Controlled Controlled Controlled 1. Chronic pain syndrome   2. Meralgia paresthetica of right side   3. Chronic right shoulder pain   4. Primary  osteoarthritis involving multiple joints   5. Other shoulder lesions (Right)   6. Long term current use of opiate analgesic   7. Opiate use (15 MME/Day)   8. Depression with anxiety   9. Diabetic peripheral neuropathy associated with type 2 diabetes mellitus ([place])   10. Insomnia secondary to chronic pain      Plan of Care  Pharmacotherapy (Medications Ordered): Meds ordered this encounter  Medications  . HYDROcodone-acetaminophen (NORCO/VICODIN) 5-325 MG tablet    Sig: Take 1 tablet by mouth every 8 (eight) hours as needed for severe pain.    Dispense:  90 tablet    Refill:  0    Do not add this medication to the electronic "Automatic Refill" notification system. Patient may have prescription filled one day early if pharmacy is closed on scheduled refill date. Do not fill until: 08/14/16 To last until: 09/13/16  . HYDROcodone-acetaminophen (NORCO/VICODIN) 5-325 MG tablet    Sig: Take 1 tablet by mouth every 8 (eight) hours as needed for severe pain.    Dispense:  90 tablet    Refill:  0    Do not add this medication to the electronic "Automatic Refill"  notification system. Patient may have prescription filled one day early if pharmacy is closed on scheduled refill date. Do not fill until: 07/15/16 To last until: 08/14/16  . HYDROcodone-acetaminophen (NORCO/VICODIN) 5-325 MG tablet    Sig: Take 1 tablet by mouth every 8 (eight) hours as needed for severe pain.    Dispense:  90 tablet    Refill:  0    Do not add this medication to the electronic "Automatic Refill" notification system. Patient may have prescription filled one day early if pharmacy is closed on scheduled refill date. Do not fill until: 06/15/16 To last until: 07/15/16  . DULoxetine (CYMBALTA) 60 MG capsule    Sig: Take 1 capsule (60 mg total) by mouth daily.    Dispense:  90 capsule    Refill:  0    Do not place this medication, or any other prescription from our practice, on "Automatic Refill". Patient may have prescription filled one day early if pharmacy is closed on scheduled refill date.  . gabapentin (NEURONTIN) 800 MG tablet    Sig: Take 1 tablet (800 mg total) by mouth 4 (four) times daily.    Dispense:  360 tablet    Refill:  0    Do not place this medication, or any other prescription from our practice, on "Automatic Refill". Patient may have prescription filled one day early if pharmacy is closed on scheduled refill date.  . Melatonin 10 MG CAPS    Sig: Take 20 mg by mouth at bedtime as needed.    Dispense:  60 capsule    Refill:  2    Do not place this medication, or any other prescription from our practice, on "Automatic Refill". Patient may have prescription filled one day early if pharmacy is closed on scheduled refill date.   New Prescriptions   No medications on file   Medications administered today: Ms. Coakley had no medications administered during this visit. Lab-work, procedure(s), and/or referral(s): Orders Placed This Encounter  Procedures  . ToxASSURE Select 13 (MW), Urine   Imaging and/or referral(s): None  Interventional  therapies: Planned, scheduled, and/or pending:   Not at this time. (Always stop Pradaxa x 5 days prior to any blocks.)   Considering:   Diagnostic Right suprascapular nerve block Diagnostic right intra-articular knee injection  Possible series  of 5 Hyalgan knee injections Diagnostic right-sided genicular nerve block Possible right-sided genicular nerve radiofrequency ablation  Palliative right intra-articular shoulder joint injection  Diagnostic right-sided suprascapular nerve block  Possible right-sided suprascapular nerve radiofrequency ablation  Palliative right lateral femoral cutaneous nerve block Palliative repeat right sided lateral femoral continuous nerve radiofrequency ablation    Palliative PRN treatment(s):   Diagnostic right intra-articular knee injection Possible series of 5 Hyalgan knee injections  Diagnostic right-sided genicular nerve block  Palliative right intra-articular shoulder joint injection  Diagnostic right-sided suprascapular nerve block  Palliative right lateral femoral cutaneous nerve block Palliative repeat right sided lateral femoral continuous nerve radiofrequency ablation    Provider-requested follow-up: Return in about 3 months (around 08/16/2016) for (Nurse Practitioner) Med-Mgmt, in addition, (PRN) procedure.  Future Appointments Date Time Provider [place]  05/22/2016 1:30 PM Dawn Spikes, DO [id number] None  12/03/2016 2:45 PM Nori Riis, PA-C [facility] None   Primary Care Physician: Dawn Spikes, DO Location: [facility] Outpatient Pain Management Facility Note by: [name] [name], M.D, DABA, DABAPM, DABPM, DABIPP, FIPP Date: 05/19/2016; Time: 3:42 PM  Pain Score Disclaimer: We use the NRS-11 scale. This is a self-reported, subjective measurement of pain severity with only modest accuracy. It is used primarily to identify changes within a particular patient. It must be understood that outpatient pain scales are significantly less  accurate that those used for research, where they can be applied under ideal controlled circumstances with minimal exposure to variables. In reality, the score is likely to be a combination of pain intensity and pain affect, where pain affect describes the degree of emotional arousal or changes in action readiness caused by the sensory experience of pain. Factors such as social and work situation, setting, emotional state, anxiety levels, expectation, and prior pain experience may influence pain perception and show large inter-individual differences that may also be affected by time variables.  Patient instructions provided during this appointment: Patient Instructions   You were given a prescription today for 3 Hydrocodone.  You have prescriptions at the pharmacy that can be picked on on 3- 26-18    Pain Score  Introduction: The pain score used by this practice is the Verbal Numerical Rating Scale (VNRS-11). This is an 11-point scale. It is for adults and children 10 years or older. There are significant differences in how the pain score is reported, used, and applied. Forget everything you learned in the past and learn this scoring system.  General Information: The scale should reflect your current level of pain. Unless you are specifically asked for the level of your worst pain, or your average pain. If you are asked for one of these two, then it should be understood that it is over the past 24 hours.  Basic Activities of Daily Living (ADL): Personal hygiene, dressing, eating, transferring, and using restroom.  Instructions: Most patients tend to report their level of pain as a combination of two factors, their physical pain and their psychosocial pain. This last one is also known as "suffering" and it is reflection of how physical pain affects you socially and psychologically. From now on, report them separately. From this point on, when asked to report your pain level, report only your physical  pain. Use the following table for reference.  Pain Clinic Pain Levels (0-5/10)  Pain Level Score Description  No Pain 0   Mild pain 1 Nagging, annoying, but does not interfere with basic activities of daily living (ADL). Patients are able to eat,  bathe, get dressed, toileting (being able to get on and off the toilet and perform personal hygiene functions), transfer (move in and out of bed or a chair without assistance), and maintain continence (able to control bladder and bowel functions). Blood pressure and heart rate are unaffected. A normal heart rate for a healthy adult ranges from 60 to 100 bpm (beats per minute).   Mild to moderate pain 2 Noticeable and distracting. Impossible to hide from other people. More frequent flare-ups. Still possible to adapt and function close to normal. It can be very annoying and may have occasional stronger flare-ups. With discipline, patients may get used to it and adapt.   Moderate pain 3 Interferes significantly with activities of daily living (ADL). It becomes difficult to feed, bathe, get dressed, get on and off the toilet or to perform personal hygiene functions. Difficult to get in and out of bed or a chair without assistance. Very distracting. With effort, it can be ignored when deeply involved in activities.   Moderately severe pain 4 Impossible to ignore for more than a few minutes. With effort, patients may still be able to manage work or participate in some social activities. Very difficult to concentrate. Signs of autonomic nervous system discharge are evident: dilated pupils (mydriasis); mild sweating (diaphoresis); sleep interference. Heart rate becomes elevated (>115 bpm). Diastolic blood pressure (lower number) rises above 100 mmHg. Patients find relief in laying down and not moving.   Severe pain 5 Intense and extremely unpleasant. Associated with frowning face and frequent crying. Pain overwhelms the senses.  Ability to do any activity or maintain  social relationships becomes significantly limited. Conversation becomes difficult. Pacing back and forth is common, as getting into a comfortable position is nearly impossible. Pain wakes you up from deep sleep. Physical signs will be obvious: pupillary dilation; increased sweating; goosebumps; brisk reflexes; cold, clammy hands and feet; nausea, vomiting or dry heaves; loss of appetite; significant sleep disturbance with inability to fall asleep or to remain asleep. When persistent, significant weight loss is observed due to the complete loss of appetite and sleep deprivation.  Blood pressure and heart rate becomes significantly elevated. Caution: If elevated blood pressure triggers a pounding headache associated with blurred vision, then the patient should immediately seek attention at an urgent or emergency care unit, as these may be signs of an impending stroke.    Emergency Department Pain Levels (6-10/10)  Emergency Room Pain 6 Severely limiting. Requires emergency care and should not be seen or managed at an outpatient pain management facility. Communication becomes difficult and requires great effort. Assistance to reach the emergency department may be required. Facial flushing and profuse sweating along with potentially dangerous increases in heart rate and blood pressure will be evident.   Distressing pain 7 Self-care is very difficult. Assistance is required to transport, or use restroom. Assistance to reach the emergency department will be required. Tasks requiring coordination, such as bathing and getting dressed become very difficult.   Disabling pain 8 Self-care is no longer possible. At this level, pain is disabling. The individual is unable to do even the most "basic" activities such as walking, eating, bathing, dressing, transferring to a bed, or toileting. Fine motor skills are lost. It is difficult to think clearly.   Incapacitating pain 9 Pain becomes incapacitating. Thought  processing is no longer possible. Difficult to remember your own name. Control of movement and coordination are lost.   The worst pain imaginable 10 At this level, most patients pass  out from pain. When this level is reached, collapse of the autonomic nervous system occurs, leading to a sudden drop in blood pressure and heart rate. This in turn results in a temporary and dramatic drop in blood flow to the brain, leading to a loss of consciousness. Fainting is one of the body's self defense mechanisms. Passing out puts the brain in a calmed state and causes it to shut down for a while, in order to begin the healing process.    Summary: 1. Refer to this scale when providing [place] with your pain level. 2. Be accurate and careful when reporting your pain level. This will help with your care. 3. Over-reporting your pain level will lead to loss of credibility. 4. Even a level of 1/10 means that there is pain and will be treated at our facility. 5. High, inaccurate reporting will be documented as "Symptom Exaggeration", leading to loss of credibility and suspicions of possible secondary gains such as obtaining more narcotics, or wanting to appear disabled, for fraudulent reasons. 6. Only pain levels of 5 or below will be seen at our facility. 7. Pain levels of 6 and above will be sent to the Emergency Department and the appointment cancelled. _____________________________________________________________________________________________

## 2016-05-19 NOTE — Progress Notes (Signed)
Nursing Pain Medication Assessment:  Safety precautions to be maintained throughout the outpatient stay will include: orient to surroundings, keep bed in low position, maintain call bell within reach at all times, provide assistance with transfer out of bed and ambulation.  Medication Inspection Compliance: Pill count conducted under aseptic conditions, in front of the patient. Neither the pills nor the bottle was removed from the patient's sight at any time. Once count was completed pills were immediately returned to the patient in their original bottle.  Medication: Hydrocodone/APAP Pill/Patch Count: 28 of 90 pills remain Bottle Appearance: Standard pharmacy container. Clearly labeled. Filled Date: 02 / 13 / 2018 Last Medication intake:  Today   Patient states she had 40 pills, believes a family member who has back pain may have taken it.

## 2016-05-19 NOTE — Patient Instructions (Addendum)
You were given a prescription today for 3 Hydrocodone.  You have prescriptions at the pharmacy that can be picked on on 3- 26-18    Pain Score  Introduction: The pain score used by this practice is the Verbal Numerical Rating Scale (VNRS-11). This is an 11-point scale. It is for adults and children 10 years or older. There are significant differences in how the pain score is reported, used, and applied. Forget everything you learned in the past and learn this scoring system.  General Information: The scale should reflect your current level of pain. Unless you are specifically asked for the level of your worst pain, or your average pain. If you are asked for one of these two, then it should be understood that it is over the past 24 hours.  Basic Activities of Daily Living (ADL): Personal hygiene, dressing, eating, transferring, and using restroom.  Instructions: Most patients tend to report their level of pain as a combination of two factors, their physical pain and their psychosocial pain. This last one is also known as "suffering" and it is reflection of how physical pain affects you socially and psychologically. From now on, report them separately. From this point on, when asked to report your pain level, report only your physical pain. Use the following table for reference.  Pain Clinic Pain Levels (0-5/10)  Pain Level Score Description  No Pain 0   Mild pain 1 Nagging, annoying, but does not interfere with basic activities of daily living (ADL). Patients are able to eat, bathe, get dressed, toileting (being able to get on and off the toilet and perform personal hygiene functions), transfer (move in and out of bed or a chair without assistance), and maintain continence (able to control bladder and bowel functions). Blood pressure and heart rate are unaffected. A normal heart rate for a healthy adult ranges from 60 to 100 bpm (beats per minute).   Mild to moderate pain 2 Noticeable and  distracting. Impossible to hide from other people. More frequent flare-ups. Still possible to adapt and function close to normal. It can be very annoying and may have occasional stronger flare-ups. With discipline, patients may get used to it and adapt.   Moderate pain 3 Interferes significantly with activities of daily living (ADL). It becomes difficult to feed, bathe, get dressed, get on and off the toilet or to perform personal hygiene functions. Difficult to get in and out of bed or a chair without assistance. Very distracting. With effort, it can be ignored when deeply involved in activities.   Moderately severe pain 4 Impossible to ignore for more than a few minutes. With effort, patients may still be able to manage work or participate in some social activities. Very difficult to concentrate. Signs of autonomic nervous system discharge are evident: dilated pupils (mydriasis); mild sweating (diaphoresis); sleep interference. Heart rate becomes elevated (>115 bpm). Diastolic blood pressure (lower number) rises above 100 mmHg. Patients find relief in laying down and not moving.   Severe pain 5 Intense and extremely unpleasant. Associated with frowning face and frequent crying. Pain overwhelms the senses.  Ability to do any activity or maintain social relationships becomes significantly limited. Conversation becomes difficult. Pacing back and forth is common, as getting into a comfortable position is nearly impossible. Pain wakes you up from deep sleep. Physical signs will be obvious: pupillary dilation; increased sweating; goosebumps; brisk reflexes; cold, clammy hands and feet; nausea, vomiting or dry heaves; loss of appetite; significant sleep disturbance with inability to fall  asleep or to remain asleep. When persistent, significant weight loss is observed due to the complete loss of appetite and sleep deprivation.  Blood pressure and heart rate becomes significantly elevated. Caution: If elevated  blood pressure triggers a pounding headache associated with blurred vision, then the patient should immediately seek attention at an urgent or emergency care unit, as these may be signs of an impending stroke.    Emergency Department Pain Levels (6-10/10)  Emergency Room Pain 6 Severely limiting. Requires emergency care and should not be seen or managed at an outpatient pain management facility. Communication becomes difficult and requires great effort. Assistance to reach the emergency department may be required. Facial flushing and profuse sweating along with potentially dangerous increases in heart rate and blood pressure will be evident.   Distressing pain 7 Self-care is very difficult. Assistance is required to transport, or use restroom. Assistance to reach the emergency department will be required. Tasks requiring coordination, such as bathing and getting dressed become very difficult.   Disabling pain 8 Self-care is no longer possible. At this level, pain is disabling. The individual is unable to do even the most "basic" activities such as walking, eating, bathing, dressing, transferring to a bed, or toileting. Fine motor skills are lost. It is difficult to think clearly.   Incapacitating pain 9 Pain becomes incapacitating. Thought processing is no longer possible. Difficult to remember your own name. Control of movement and coordination are lost.   The worst pain imaginable 10 At this level, most patients pass out from pain. When this level is reached, collapse of the autonomic nervous system occurs, leading to a sudden drop in blood pressure and heart rate. This in turn results in a temporary and dramatic drop in blood flow to the brain, leading to a loss of consciousness. Fainting is one of the body's self defense mechanisms. Passing out puts the brain in a calmed state and causes it to shut down for a while, in order to begin the healing process.    Summary: 1. Refer to this scale when  providing [place] with your pain level. 2. Be accurate and careful when reporting your pain level. This will help with your care. 3. Over-reporting your pain level will lead to loss of credibility. 4. Even a level of 1/10 means that there is pain and will be treated at our facility. 5. High, inaccurate reporting will be documented as "Symptom Exaggeration", leading to loss of credibility and suspicions of possible secondary gains such as obtaining more narcotics, or wanting to appear disabled, for fraudulent reasons. 6. Only pain levels of 5 or below will be seen at our facility. 7. Pain levels of 6 and above will be sent to the Emergency Department and the appointment cancelled. _____________________________________________________________________________________________

## 2016-05-21 ENCOUNTER — Other Ambulatory Visit: Payer: Self-pay | Admitting: Pain Medicine

## 2016-05-21 ENCOUNTER — Other Ambulatory Visit: Payer: Self-pay | Admitting: Family Medicine

## 2016-05-21 ENCOUNTER — Other Ambulatory Visit: Payer: Self-pay | Admitting: Cardiology

## 2016-05-21 DIAGNOSIS — E1142 Type 2 diabetes mellitus with diabetic polyneuropathy: Secondary | ICD-10-CM

## 2016-05-21 DIAGNOSIS — Z1231 Encounter for screening mammogram for malignant neoplasm of breast: Secondary | ICD-10-CM

## 2016-05-22 ENCOUNTER — Encounter: Payer: Self-pay | Admitting: Family Medicine

## 2016-05-22 ENCOUNTER — Ambulatory Visit (INDEPENDENT_AMBULATORY_CARE_PROVIDER_SITE_OTHER): Payer: PPO | Admitting: Family Medicine

## 2016-05-22 VITALS — BP 126/92 | HR 51 | Temp 98.2°F | Wt 263.8 lb

## 2016-05-22 DIAGNOSIS — R0600 Dyspnea, unspecified: Secondary | ICD-10-CM

## 2016-05-22 DIAGNOSIS — G4733 Obstructive sleep apnea (adult) (pediatric): Secondary | ICD-10-CM

## 2016-05-22 DIAGNOSIS — E1151 Type 2 diabetes mellitus with diabetic peripheral angiopathy without gangrene: Secondary | ICD-10-CM

## 2016-05-22 DIAGNOSIS — K219 Gastro-esophageal reflux disease without esophagitis: Secondary | ICD-10-CM | POA: Diagnosis not present

## 2016-05-22 DIAGNOSIS — R0609 Other forms of dyspnea: Secondary | ICD-10-CM

## 2016-05-22 DIAGNOSIS — E039 Hypothyroidism, unspecified: Secondary | ICD-10-CM | POA: Diagnosis not present

## 2016-05-22 DIAGNOSIS — I10 Essential (primary) hypertension: Secondary | ICD-10-CM

## 2016-05-22 DIAGNOSIS — D649 Anemia, unspecified: Secondary | ICD-10-CM

## 2016-05-22 NOTE — Progress Notes (Signed)
Subjective:  Patient ID: [name], female    DOB: November 19, 1950  Age: 66 y.o. MRN: [id number]  CC: Follow up  HPI:  66 year old female with a past medical history of chronic pain, atrial fibrillation, peripheral artery disease, hypertension, DM 2, asthma, OSA presents for follow up.  Worsening reflux, recent nausea/vomiting  Patient reports recent nausea and vomiting following breakfast yesterday.  At a biscuit and subsequently became nauseated and vomited.  Reports recent worsening reflux despite PPI.  No fever, chills. No abdominal pain.  DM-2  Has been stable on metformin.  In need of A1C today.  Hypertension  Stable on Accupril, metoprolol.  OSA  Stable on CPAP  DOE  Continues to have DOE  Afib stable per cardiology.  Pulmonary states appears stable.  Working out at [employer] trying to lose weight.  Social Hx   Social History   Social History  . Marital status: Married    Spouse name: N/A  . Number of children: N/A  . Years of education: N/A   Social History Main Topics  . Smoking status: Never Smoker  . Smokeless tobacco: Never Used  . Alcohol use No  . Drug use: No  . Sexual activity: No   Other Topics Concern  . None   Social History Narrative   She is currently married -- for 28 years. Does not work. Does not smoke or take alcohol. She never smoked. She exercises at least 3 days a week since before her gastric surgery.   Marital status reviewed in history of present illness.   Review of Systems  Constitutional: Negative.   Respiratory: Positive for shortness of breath.   Gastrointestinal: Negative for nausea and vomiting.   Objective:  BP (!) 126/92   Pulse (!) 51   Temp 98.2 F (36.8 C) (Oral)   Wt 263 lb 12.8 oz (119.7 kg)   SpO2 98%   BMI 43.90 kg/m   BP/Weight 05/22/2016 05/19/2016 10/93/2355  Systolic BP 732 202 542  Diastolic BP 92 71 85  Wt. (Lbs) 263.8 260 254.25  BMI 43.9 43.27 41.04    Physical Exam    Constitutional: She is oriented to person, place, and time. She appears well-developed. No distress.  Cardiovascular: Normal rate and regular rhythm.   Pulmonary/Chest: Effort normal and breath sounds normal.  Abdominal: Soft. She exhibits no distension. There is no tenderness.  Neurological: She is alert and oriented to person, place, and time.  Psychiatric: She has a normal mood and affect.  Vitals reviewed.  Lab Results  Component Value Date   WBC 5.8 11/06/2015   HGB 11.4 (L) 11/06/2015   HCT 35.1 (L) 11/06/2015   PLT 396.0 11/06/2015   GLUCOSE 170 (H) 10/11/2015   CHOL 173 08/05/2015   TRIG 50 08/05/2015   HDL 97 08/05/2015   LDLCALC 66 08/05/2015   ALT 14 08/05/2015   AST 14 (L) 08/05/2015   NA 137 10/11/2015   K 4.0 10/11/2015   CL 102 10/11/2015   CREATININE 0.60 10/11/2015   BUN 15 10/11/2015   CO2 27 10/11/2015   TSH 1.565 08/05/2015   INR 1.18 09/26/2015   HGBA1C 6.6 (H) 08/05/2015    Assessment & Plan:   Problem List Items Addressed This Visit    Gastro-esophageal reflux disease without esophagitis    Recent worsening. Stopping Protonix. Trial of Dexilant.      Essential hypertension    Stable. Continue Accupril and Metoprolol.       DOE (dyspnea  on exertion)    Multifactorial. Cardiac and pulmonary disease processes appear stable. Advised continued exercise and weight loss.       DM (diabetes mellitus) type II controlled peripheral vascular disorder (HCC) - Primary (Chronic)    Stable. A1C today. Continue Metformin.      Relevant Orders   Hemoglobin A1c   Comp Met (CMET)   Lipid panel   Apnea, sleep    Stable on CPAP.       Other Visit Diagnoses    Hypothyroidism, unspecified type       Relevant Orders   TSH   Anemia, unspecified type       Relevant Orders   CBC      No orders of the defined types were placed in this encounter.   Follow-up: No Follow-up on file.  [place]

## 2016-05-22 NOTE — Patient Instructions (Addendum)
We'll try the Dexilant (don't take the protonix).  We will call with your lab results.  Follow up in 3 - 6 months.  Take care  Dr. Lacinda Axon

## 2016-05-22 NOTE — Progress Notes (Signed)
Pre visit review using our clinic review tool, if applicable. No additional management support is needed unless otherwise documented below in the visit note. 

## 2016-05-22 NOTE — Telephone Encounter (Signed)
Rx(s) sent to pharmacy electronically.  

## 2016-05-24 LAB — TOXASSURE SELECT 13 (MW), URINE

## 2016-05-24 NOTE — Assessment & Plan Note (Signed)
Multifactorial. Cardiac and pulmonary disease processes appear stable. Advised continued exercise and weight loss.

## 2016-05-24 NOTE — Assessment & Plan Note (Signed)
Stable. A1C today. Continue Metformin.

## 2016-05-24 NOTE — Assessment & Plan Note (Signed)
Recent worsening. Stopping Protonix. Trial of Dexilant.

## 2016-05-24 NOTE — Assessment & Plan Note (Signed)
Stable. Continue Accupril and Metoprolol.

## 2016-05-24 NOTE — Assessment & Plan Note (Signed)
Stable on CPAP 

## 2016-05-26 ENCOUNTER — Other Ambulatory Visit: Payer: Self-pay | Admitting: Family Medicine

## 2016-06-03 DIAGNOSIS — G4733 Obstructive sleep apnea (adult) (pediatric): Secondary | ICD-10-CM | POA: Diagnosis not present

## 2016-06-04 ENCOUNTER — Telehealth: Payer: Self-pay

## 2016-06-04 ENCOUNTER — Other Ambulatory Visit: Payer: PPO

## 2016-06-04 NOTE — Telephone Encounter (Signed)
Pts pharmacy did not receive an e script from Dr Dossie Arbour. Pt said her pharmacy called her yesterday. Please call pts pharmacy @ [phone]. Please call pt as well to give her an update on what is going on The meds the pt needs are Cymbalta and gabapentin

## 2016-06-04 NOTE — Telephone Encounter (Signed)
Confirmed with pharmacy that both prescriptions were sent on 05-19-16, with enough to last 3 months. Patient notified.

## 2016-06-09 ENCOUNTER — Ambulatory Visit (INDEPENDENT_AMBULATORY_CARE_PROVIDER_SITE_OTHER): Payer: PPO

## 2016-06-09 ENCOUNTER — Ambulatory Visit (INDEPENDENT_AMBULATORY_CARE_PROVIDER_SITE_OTHER): Payer: PPO | Admitting: Family Medicine

## 2016-06-09 VITALS — BP 158/70 | HR 59 | Temp 97.7°F | Wt 255.2 lb

## 2016-06-09 DIAGNOSIS — R7981 Abnormal blood-gas level: Secondary | ICD-10-CM

## 2016-06-09 DIAGNOSIS — J069 Acute upper respiratory infection, unspecified: Secondary | ICD-10-CM | POA: Diagnosis not present

## 2016-06-09 DIAGNOSIS — R0902 Hypoxemia: Secondary | ICD-10-CM | POA: Diagnosis not present

## 2016-06-09 DIAGNOSIS — D649 Anemia, unspecified: Secondary | ICD-10-CM

## 2016-06-09 DIAGNOSIS — E039 Hypothyroidism, unspecified: Secondary | ICD-10-CM

## 2016-06-09 DIAGNOSIS — E1151 Type 2 diabetes mellitus with diabetic peripheral angiopathy without gangrene: Secondary | ICD-10-CM

## 2016-06-09 LAB — CBC
HEMATOCRIT: 37.2 % (ref 36.0–46.0)
Hemoglobin: 11.7 g/dL — ABNORMAL LOW (ref 12.0–15.0)
MCHC: 31.3 g/dL (ref 30.0–36.0)
MCV: 77.1 fl — AB (ref 78.0–100.0)
Platelets: 294 10*3/uL (ref 150.0–400.0)
RBC: 4.83 Mil/uL (ref 3.87–5.11)
RDW: 16.8 % — ABNORMAL HIGH (ref 11.5–15.5)
WBC: 7.2 10*3/uL (ref 4.0–10.5)

## 2016-06-09 LAB — COMPREHENSIVE METABOLIC PANEL
ALBUMIN: 4.2 g/dL (ref 3.5–5.2)
ALK PHOS: 83 U/L (ref 39–117)
ALT: 9 U/L (ref 0–35)
AST: 11 U/L (ref 0–37)
BUN: 19 mg/dL (ref 6–23)
CHLORIDE: 106 meq/L (ref 96–112)
CO2: 27 mEq/L (ref 19–32)
Calcium: 9.4 mg/dL (ref 8.4–10.5)
Creatinine, Ser: 0.86 mg/dL (ref 0.40–1.20)
GFR: 70.01 mL/min (ref >60.00)
GLUCOSE: 118 mg/dL — AB (ref 70–99)
POTASSIUM: 5.3 meq/L — AB (ref 3.5–5.1)
SODIUM: 141 meq/L (ref 135–145)
TOTAL PROTEIN: 6.5 g/dL (ref 6.0–8.3)
Total Bilirubin: 0.3 mg/dL (ref 0.2–1.2)

## 2016-06-09 LAB — LIPID PANEL
CHOLESTEROL: 191 mg/dL (ref 0–200)
HDL: 81.4 mg/dL (ref >39.00)
LDL CALC: 79 mg/dL (ref 0–99)
NonHDL: 109.29
Total CHOL/HDL Ratio: 2
Triglycerides: 150 mg/dL — ABNORMAL HIGH (ref 0.0–149.0)
VLDL: 30 mg/dL (ref 0.0–40.0)

## 2016-06-09 LAB — HEMOGLOBIN A1C: HEMOGLOBIN A1C: 6.9 % — AB (ref 4.6–6.5)

## 2016-06-09 LAB — TSH: TSH: 1.06 u[IU]/mL (ref 0.35–4.50)

## 2016-06-09 MED ORDER — LEVOCETIRIZINE DIHYDROCHLORIDE 5 MG PO TABS: 5.0000 mg | ORAL_TABLET | Freq: Every evening | ORAL | 0 refills | Status: DC

## 2016-06-09 MED ORDER — IPRATROPIUM BROMIDE 0.06 % NA SOLN: 2.0000 | Freq: Four times a day (QID) | NASAL | 12 refills | Status: DC

## 2016-06-09 NOTE — Assessment & Plan Note (Signed)
Oxygen saturation low normal initially. On repeat was 95%. Chest x-ray today.

## 2016-06-09 NOTE — Progress Notes (Signed)
Subjective:  Patient ID: [name], female    DOB: June 24, 1950  Age: 66 y.o. MRN: [id number]  CC: URI  HPI:  66 year old female with extensive past medical history including chronic pain, DM 2, peripheral vascular disease, hypertension, hyperlipidemia, morbid obesity, asthma presents with upper respiratory symptoms.  Patient reports that over the weekend she and her husband sprayed some chemicals for ants. On Sunday she developed sore throat, runny nose, and cough. No fever. No body aches. She reports associated fatigue. She's been using Sudafed without significant improvement. No other associated symptoms. No other complaints or concerns at this time.   Social Hx   Social History   Social History  . Marital status: Married    Spouse name: N/A  . Number of children: N/A  . Years of education: N/A   Social History Main Topics  . Smoking status: Never Smoker  . Smokeless tobacco: Never Used  . Alcohol use No  . Drug use: No  . Sexual activity: No   Other Topics Concern  . Not on file   Social History Narrative   She is currently married -- for 28 years. Does not work. Does not smoke or take alcohol. She never smoked. She exercises at least 3 days a week since before her gastric surgery.   Marital status reviewed in history of present illness.    Review of Systems  Constitutional: Positive for fatigue.  HENT: Positive for rhinorrhea and sore throat.   Respiratory: Positive for cough.    Objective:  BP (!) 158/70   Pulse (!) 59   Temp 97.7 F (36.5 C)   Wt 255 lb 3.2 oz (115.8 kg)   SpO2 91%   BMI 42.47 kg/m   BP/Weight 06/09/2016 05/22/2016 2/58/5277  Systolic BP 824 235 361  Diastolic BP 70 92 71  Wt. (Lbs) 255.2 263.8 260  BMI 42.47 43.9 43.27   Physical Exam  Constitutional: She is oriented to person, place, and time. She appears well-developed. No distress.  HENT:  Mouth/Throat: Oropharynx is clear and moist.  Normal TM's bilaterally.  Neck: Neck  supple.  Cardiovascular:  Bradycardic. Regular rhythm.  Pulmonary/Chest: Effort normal. She has no wheezes. She has no rales.  Neurological: She is alert and oriented to person, place, and time.  Psychiatric: She has a normal mood and affect.  Vitals reviewed.   Lab Results  Component Value Date   WBC 5.8 11/06/2015   HGB 11.4 (L) 11/06/2015   HCT 35.1 (L) 11/06/2015   PLT 396.0 11/06/2015   GLUCOSE 170 (H) 10/11/2015   CHOL 173 08/05/2015   TRIG 50 08/05/2015   HDL 97 08/05/2015   LDLCALC 66 08/05/2015   ALT 14 08/05/2015   AST 14 (L) 08/05/2015   NA 137 10/11/2015   K 4.0 10/11/2015   CL 102 10/11/2015   CREATININE 0.60 10/11/2015   BUN 15 10/11/2015   CO2 27 10/11/2015   TSH 1.565 08/05/2015   INR 1.18 09/26/2015   HGBA1C 6.6 (H) 08/05/2015    Assessment & Plan:   Problem List Items Addressed This Visit    DM (diabetes mellitus) type II controlled peripheral vascular disorder ([place]) (Chronic)   Upper respiratory tract infection - Primary    New problem. Likely viral versus allergic. Treating with Atrovent nasal spray and xyzal.       Relevant Orders   DG Chest 2 View   Low oxygen saturation    Oxygen saturation low normal initially. On repeat was 95%.  Chest x-ray today.      Relevant Orders   DG Chest 2 View    Other Visit Diagnoses    Anemia, unspecified type       Hypothyroidism, unspecified type          Meds ordered this encounter  Medications  . ipratropium (ATROVENT) 0.06 % nasal spray    Sig: Place 2 sprays into both nostrils 4 (four) times daily.    Dispense:  15 mL    Refill:  12  . levocetirizine (XYZAL) 5 MG tablet    Sig: Take 1 tablet (5 mg total) by mouth every evening.    Dispense:  30 tablet    Refill:  0    Follow-up: PRN  [place]

## 2016-06-09 NOTE — Patient Instructions (Addendum)
Medications as prescribed.  We will call with your xray results.  Take care  Dr. Lacinda Axon

## 2016-06-09 NOTE — Assessment & Plan Note (Signed)
New problem. Likely viral versus allergic. Treating with Atrovent nasal spray and xyzal.

## 2016-06-11 ENCOUNTER — Other Ambulatory Visit: Payer: Self-pay | Admitting: Family Medicine

## 2016-06-11 DIAGNOSIS — D509 Iron deficiency anemia, unspecified: Secondary | ICD-10-CM

## 2016-06-16 ENCOUNTER — Other Ambulatory Visit: Payer: Self-pay | Admitting: Family Medicine

## 2016-06-16 ENCOUNTER — Other Ambulatory Visit: Payer: Self-pay | Admitting: Pain Medicine

## 2016-06-16 DIAGNOSIS — F329 Major depressive disorder, single episode, unspecified: Secondary | ICD-10-CM

## 2016-06-16 DIAGNOSIS — G894 Chronic pain syndrome: Secondary | ICD-10-CM

## 2016-06-16 DIAGNOSIS — F419 Anxiety disorder, unspecified: Secondary | ICD-10-CM

## 2016-06-17 NOTE — Telephone Encounter (Signed)
Refilled: 04/24/16 Last OV: 05/22/16 Last Labs: 06/09/16 Future OV: none Please advise?

## 2016-06-18 ENCOUNTER — Ambulatory Visit: Payer: PPO

## 2016-06-18 NOTE — Telephone Encounter (Signed)
faxed

## 2016-06-30 ENCOUNTER — Other Ambulatory Visit: Payer: Self-pay | Admitting: Family Medicine

## 2016-06-30 ENCOUNTER — Telehealth: Payer: Self-pay | Admitting: *Deleted

## 2016-06-30 MED ORDER — DEXLANSOPRAZOLE 30 MG PO CPDR: 30.0000 mg | DELAYED_RELEASE_CAPSULE | Freq: Every day | ORAL | 1 refills | Status: DC

## 2016-06-30 NOTE — Telephone Encounter (Signed)
Patient requested a Rx for Dexilant, pt stated that she has been taking sample's and now need a Rx [facility]   Pt contact [phone]

## 2016-06-30 NOTE — Telephone Encounter (Signed)
Rx sent 

## 2016-07-01 ENCOUNTER — Other Ambulatory Visit: Payer: PPO

## 2016-07-02 ENCOUNTER — Other Ambulatory Visit (INDEPENDENT_AMBULATORY_CARE_PROVIDER_SITE_OTHER): Payer: PPO

## 2016-07-02 DIAGNOSIS — D509 Iron deficiency anemia, unspecified: Secondary | ICD-10-CM | POA: Diagnosis not present

## 2016-07-03 ENCOUNTER — Other Ambulatory Visit: Payer: Self-pay | Admitting: Family Medicine

## 2016-07-03 LAB — CBC
HCT: 34.7 % — ABNORMAL LOW (ref 36.0–46.0)
HEMOGLOBIN: 10.8 g/dL — AB (ref 12.0–15.0)
MCHC: 31.1 g/dL (ref 30.0–36.0)
MCV: 78.7 fl (ref 78.0–100.0)
PLATELETS: 296 10*3/uL (ref 150.0–400.0)
RBC: 4.41 Mil/uL (ref 3.87–5.11)
RDW: 18.9 % — ABNORMAL HIGH (ref 11.5–15.5)
WBC: 7.8 10*3/uL (ref 4.0–10.5)

## 2016-07-03 LAB — IRON,TIBC AND FERRITIN PANEL
%SAT: 9 % — AB (ref 11–50)
Ferritin: 8 ng/mL — ABNORMAL LOW (ref 20–288)
Iron: 41 ug/dL — ABNORMAL LOW (ref 45–160)
TIBC: 471 ug/dL — AB (ref 250–450)

## 2016-07-03 MED ORDER — FERROUS SULFATE 325 (65 FE) MG PO TABS: 325.0000 mg | ORAL_TABLET | Freq: Two times a day (BID) | ORAL | 3 refills | Status: DC

## 2016-07-04 DIAGNOSIS — G4733 Obstructive sleep apnea (adult) (pediatric): Secondary | ICD-10-CM | POA: Diagnosis not present

## 2016-07-06 ENCOUNTER — Ambulatory Visit
Admission: RE | Admit: 2016-07-06 | Discharge: 2016-07-06 | Disposition: A | Discharge status: Home / Self Care | Payer: PPO | Source: Ambulatory Visit | Attending: Family Medicine | Admitting: Family Medicine

## 2016-07-06 DIAGNOSIS — Z1231 Encounter for screening mammogram for malignant neoplasm of breast: Secondary | ICD-10-CM | POA: Diagnosis not present

## 2016-07-07 ENCOUNTER — Telehealth: Payer: Self-pay | Admitting: Family Medicine

## 2016-07-07 MED ORDER — LEVOCETIRIZINE DIHYDROCHLORIDE 5 MG PO TABS: 5.0000 mg | ORAL_TABLET | Freq: Every evening | ORAL | 3 refills | Status: DC

## 2016-07-07 NOTE — Telephone Encounter (Signed)
Pt called requesting a refill on levocetirizine (XYZAL) 5 MG tablet. Please advise, thank you!  [place], [place]  Call pt @ [phone]

## 2016-07-07 NOTE — Telephone Encounter (Signed)
Refill sent to pharmacy.   

## 2016-07-13 DIAGNOSIS — G5621 Lesion of ulnar nerve, right upper limb: Secondary | ICD-10-CM | POA: Diagnosis not present

## 2016-07-22 ENCOUNTER — Other Ambulatory Visit: Payer: Self-pay | Admitting: Family Medicine

## 2016-07-23 ENCOUNTER — Other Ambulatory Visit: Payer: Self-pay | Admitting: Family Medicine

## 2016-07-23 DIAGNOSIS — F329 Major depressive disorder, single episode, unspecified: Secondary | ICD-10-CM

## 2016-07-23 DIAGNOSIS — F419 Anxiety disorder, unspecified: Secondary | ICD-10-CM

## 2016-07-24 NOTE — Telephone Encounter (Signed)
Refilled: 06/18/16 Last OV: 05/22/16 Last Labs: 07/02/16 Future OV: none Please advise?

## 2016-07-24 NOTE — Telephone Encounter (Signed)
faxed

## 2016-07-28 ENCOUNTER — Encounter
Admission: RE | Admit: 2016-07-28 | Discharge: 2016-07-28 | Disposition: A | Discharge status: Home / Self Care | Payer: PPO | Source: Ambulatory Visit | Attending: Surgery | Admitting: Surgery

## 2016-07-28 DIAGNOSIS — E119 Type 2 diabetes mellitus without complications: Secondary | ICD-10-CM | POA: Diagnosis not present

## 2016-07-28 DIAGNOSIS — Z01812 Encounter for preprocedural laboratory examination: Secondary | ICD-10-CM | POA: Diagnosis not present

## 2016-07-28 HISTORY — DX: Personal history of Methicillin resistant Staphylococcus aureus infection: Z86.14

## 2016-07-28 LAB — SURGICAL PCR SCREEN
MRSA, PCR: NEGATIVE
STAPHYLOCOCCUS AUREUS: NEGATIVE

## 2016-07-28 LAB — POTASSIUM: Potassium: 4.8 mmol/L (ref 3.5–5.1)

## 2016-07-28 NOTE — Patient Instructions (Signed)
  Your procedure is scheduled on: Thurs. 08/06/16 Report to Day Surgery. To find out your arrival time please call [phone] between 1PM - 3PM on Wed. 08/05/16.  Remember: Instructions that are not followed completely may result in serious medical risk, up to and including death, or upon the discretion of your surgeon and anesthesiologist your surgery may need to be rescheduled.    __x__ 1. Do not eat food or drink liquids after midnight. No gum chewing or hard candies.     __x__ 2. No Alcohol for 24 hours before or after surgery.   ____ 3. Do Not Smoke For 24 Hours Prior to Your Surgery.   ____ 4. Bring all medications with you on the day of surgery if instructed.    __x__ 5. Notify your doctor if there is any change in your medical condition     (cold, fever, infections).       Do not wear jewelry, make-up, hairpins, clips or nail polish.  Do not wear lotions, powders, or perfumes. You may wear deodorant.  Do not shave 48 hours prior to surgery. Men may shave face and neck.  Do not bring valuables to the hospital.    [facility] is not responsible for any belongings or valuables.               Contacts, dentures or bridgework may not be worn into surgery.  Leave your suitcase in the car. After surgery it may be brought to your room.  For patients admitted to the hospital, discharge time is determined by your                treatment team.   Patients discharged the day of surgery will not be allowed to drive home.   Please read over the following fact sheets that you were given:      __x__ Take these medicines the morning of surgery with A SIP OF WATER:    1. ALPRAZolam (XANAX) 0.5 MG tablet  2. buPROPion (WELLBUTRIN XL) 300 MG 24 hr tablet  3. metoprolol tartrate (LOPRESSOR) 25 MG tablet              4.Dexlansoprazole 30 MG capsule  5.gabapentin (NEURONTIN) 800 MG tablet  6.HYDROcodone-acetaminophen (NORCO/VICODIN) 5-325 MG tablet             7.mirabegron ER (MYRBETRIQ)  25 MG TB24 tablet              8.quinapril (ACCUPRIL) 20 MG tablet ____ Fleet Enema (as directed)   _x___ Use CHG Soap as directed  _x___ Use inhalers on the day of surgery  __x__ Stop metformin 2 days prior to surgery    ____ Take 1/2 of usual insulin dose the night before surgery and none on the morning of surgery.   _x___ Stop Pradaxa on 08/02/16  _x___ Stop Anti-inflammatories on 5/10 meloxicam (MOBIC) 15 MG tablet    ____ Stop supplements until after surgery.    _x __ Bring C-Pap to the hospital.

## 2016-08-03 DIAGNOSIS — G4733 Obstructive sleep apnea (adult) (pediatric): Secondary | ICD-10-CM | POA: Diagnosis not present

## 2016-08-05 MED ORDER — CLINDAMYCIN PHOSPHATE 900 MG/50ML IV SOLN
900.0000 mg | Freq: Once | INTRAVENOUS | Status: AC
  Administered 2016-08-06: 900 mg via INTRAVENOUS

## 2016-08-06 ENCOUNTER — Ambulatory Visit: Payer: PPO | Admitting: Anesthesiology

## 2016-08-06 ENCOUNTER — Encounter: Payer: Self-pay | Admitting: *Deleted

## 2016-08-06 ENCOUNTER — Ambulatory Visit: Payer: PPO

## 2016-08-06 ENCOUNTER — Encounter
Admission: RE | Disposition: A | Discharge status: Home / Self Care | Payer: Self-pay | Source: Ambulatory Visit | Attending: Surgery

## 2016-08-06 ENCOUNTER — Observation Stay
Admission: RE | Admit: 2016-08-06 | Discharge: 2016-08-07 | Disposition: A | Discharge status: Home / Self Care | Payer: PPO | Source: Ambulatory Visit | Attending: Surgery | Admitting: Surgery

## 2016-08-06 DIAGNOSIS — G4733 Obstructive sleep apnea (adult) (pediatric): Secondary | ICD-10-CM | POA: Insufficient documentation

## 2016-08-06 DIAGNOSIS — R0689 Other abnormalities of breathing: Secondary | ICD-10-CM

## 2016-08-06 DIAGNOSIS — F419 Anxiety disorder, unspecified: Secondary | ICD-10-CM | POA: Diagnosis not present

## 2016-08-06 DIAGNOSIS — K219 Gastro-esophageal reflux disease without esophagitis: Secondary | ICD-10-CM | POA: Insufficient documentation

## 2016-08-06 DIAGNOSIS — Z886 Allergy status to analgesic agent status: Secondary | ICD-10-CM | POA: Insufficient documentation

## 2016-08-06 DIAGNOSIS — Z7984 Long term (current) use of oral hypoglycemic drugs: Secondary | ICD-10-CM | POA: Diagnosis not present

## 2016-08-06 DIAGNOSIS — F329 Major depressive disorder, single episode, unspecified: Secondary | ICD-10-CM | POA: Diagnosis not present

## 2016-08-06 DIAGNOSIS — I10 Essential (primary) hypertension: Secondary | ICD-10-CM | POA: Diagnosis not present

## 2016-08-06 DIAGNOSIS — I739 Peripheral vascular disease, unspecified: Secondary | ICD-10-CM | POA: Diagnosis not present

## 2016-08-06 DIAGNOSIS — Z6841 Body Mass Index (BMI) 40.0 and over, adult: Secondary | ICD-10-CM | POA: Diagnosis not present

## 2016-08-06 DIAGNOSIS — E1151 Type 2 diabetes mellitus with diabetic peripheral angiopathy without gangrene: Secondary | ICD-10-CM | POA: Diagnosis not present

## 2016-08-06 DIAGNOSIS — Z7901 Long term (current) use of anticoagulants: Secondary | ICD-10-CM | POA: Insufficient documentation

## 2016-08-06 DIAGNOSIS — I480 Paroxysmal atrial fibrillation: Secondary | ICD-10-CM | POA: Diagnosis not present

## 2016-08-06 DIAGNOSIS — R0902 Hypoxemia: Secondary | ICD-10-CM | POA: Diagnosis not present

## 2016-08-06 DIAGNOSIS — M1990 Unspecified osteoarthritis, unspecified site: Secondary | ICD-10-CM | POA: Insufficient documentation

## 2016-08-06 DIAGNOSIS — E039 Hypothyroidism, unspecified: Secondary | ICD-10-CM | POA: Insufficient documentation

## 2016-08-06 DIAGNOSIS — Z880 Allergy status to penicillin: Secondary | ICD-10-CM | POA: Insufficient documentation

## 2016-08-06 DIAGNOSIS — Z79891 Long term (current) use of opiate analgesic: Secondary | ICD-10-CM | POA: Insufficient documentation

## 2016-08-06 DIAGNOSIS — R0600 Dyspnea, unspecified: Secondary | ICD-10-CM | POA: Diagnosis not present

## 2016-08-06 DIAGNOSIS — G4700 Insomnia, unspecified: Secondary | ICD-10-CM | POA: Diagnosis not present

## 2016-08-06 DIAGNOSIS — E119 Type 2 diabetes mellitus without complications: Secondary | ICD-10-CM | POA: Diagnosis not present

## 2016-08-06 DIAGNOSIS — G4730 Sleep apnea, unspecified: Secondary | ICD-10-CM | POA: Diagnosis not present

## 2016-08-06 DIAGNOSIS — K589 Irritable bowel syndrome without diarrhea: Secondary | ICD-10-CM | POA: Diagnosis not present

## 2016-08-06 DIAGNOSIS — J449 Chronic obstructive pulmonary disease, unspecified: Secondary | ICD-10-CM | POA: Diagnosis not present

## 2016-08-06 DIAGNOSIS — R0602 Shortness of breath: Secondary | ICD-10-CM | POA: Diagnosis present

## 2016-08-06 DIAGNOSIS — Z8614 Personal history of Methicillin resistant Staphylococcus aureus infection: Secondary | ICD-10-CM | POA: Diagnosis not present

## 2016-08-06 DIAGNOSIS — J45909 Unspecified asthma, uncomplicated: Secondary | ICD-10-CM | POA: Insufficient documentation

## 2016-08-06 DIAGNOSIS — G894 Chronic pain syndrome: Secondary | ICD-10-CM | POA: Insufficient documentation

## 2016-08-06 DIAGNOSIS — E1142 Type 2 diabetes mellitus with diabetic polyneuropathy: Secondary | ICD-10-CM | POA: Diagnosis not present

## 2016-08-06 DIAGNOSIS — G5621 Lesion of ulnar nerve, right upper limb: Principal | ICD-10-CM | POA: Insufficient documentation

## 2016-08-06 DIAGNOSIS — I82509 Chronic embolism and thrombosis of unspecified deep veins of unspecified lower extremity: Secondary | ICD-10-CM | POA: Insufficient documentation

## 2016-08-06 HISTORY — PX: ULNAR NERVE TRANSPOSITION: SHX2595

## 2016-08-06 LAB — GLUCOSE, CAPILLARY
Glucose-Capillary: 161 mg/dL — ABNORMAL HIGH (ref 65–99)
Glucose-Capillary: 127 mg/dL — ABNORMAL HIGH (ref 65–99)

## 2016-08-06 LAB — CBC
HCT: 36.1 % (ref 35.0–47.0)
Hemoglobin: 11.4 g/dL — ABNORMAL LOW (ref 12.0–16.0)
MCH: 25.6 pg — ABNORMAL LOW (ref 26.0–34.0)
MCHC: 31.7 g/dL — AB (ref 32.0–36.0)
MCV: 81 fL (ref 80.0–100.0)
PLATELETS: 200 10*3/uL (ref 150–440)
RBC: 4.46 MIL/uL (ref 3.80–5.20)
RDW: 21.7 % — AB (ref 11.5–14.5)
WBC: 4.8 10*3/uL (ref 3.6–11.0)

## 2016-08-06 LAB — CREATININE, SERUM
CREATININE: 0.89 mg/dL (ref 0.44–1.00)
GFR calc Af Amer: >60 mL/min (ref >60)

## 2016-08-06 SURGERY — ULNAR NERVE DECOMPRESSION/TRANSPOSITION
Anesthesia: General | Site: Elbow | Laterality: Right | Wound class: Clean

## 2016-08-06 MED ORDER — IPRATROPIUM-ALBUTEROL 0.5-2.5 (3) MG/3ML IN SOLN
RESPIRATORY_TRACT | Status: AC
  Administered 2016-08-06: 3 mL via RESPIRATORY_TRACT
  Filled 2016-08-06: qty 3

## 2016-08-06 MED ORDER — MOMETASONE FURO-FORMOTEROL FUM 200-5 MCG/ACT IN AERO
2.0000 | INHALATION_SPRAY | Freq: Two times a day (BID) | RESPIRATORY_TRACT | Status: DC
  Administered 2016-08-06 – 2016-08-07 (×2): 2 via RESPIRATORY_TRACT
  Filled 2016-08-06: qty 8.8

## 2016-08-06 MED ORDER — GABAPENTIN 400 MG PO CAPS
800.0000 mg | ORAL_CAPSULE | Freq: Four times a day (QID) | ORAL | Status: DC
  Administered 2016-08-06 – 2016-08-07 (×2): 800 mg via ORAL
  Filled 2016-08-06 (×2): qty 2

## 2016-08-06 MED ORDER — IPRATROPIUM BROMIDE 0.06 % NA SOLN
2.0000 | Freq: Two times a day (BID) | NASAL | Status: DC | PRN
  Filled 2016-08-06: qty 15

## 2016-08-06 MED ORDER — BISACODYL 10 MG RE SUPP: 10.0000 mg | Freq: Every day | RECTAL | Status: DC | PRN

## 2016-08-06 MED ORDER — ALBUTEROL SULFATE (2.5 MG/3ML) 0.083% IN NEBU: 3.0000 mL | INHALATION_SOLUTION | RESPIRATORY_TRACT | Status: DC | PRN

## 2016-08-06 MED ORDER — ESTRADIOL 0.1 MG/GM VA CREA
1.0000 | TOPICAL_CREAM | VAGINAL | Status: DC
  Filled 2016-08-06: qty 42.5

## 2016-08-06 MED ORDER — DEXAMETHASONE SODIUM PHOSPHATE 10 MG/ML IJ SOLN
INTRAMUSCULAR | Status: AC
  Filled 2016-08-06: qty 1

## 2016-08-06 MED ORDER — METFORMIN HCL 500 MG PO TABS
1000.0000 mg | ORAL_TABLET | Freq: Every day | ORAL | Status: DC
  Administered 2016-08-07: 1000 mg via ORAL
  Filled 2016-08-06: qty 2

## 2016-08-06 MED ORDER — ROCURONIUM BROMIDE 100 MG/10ML IV SOLN
INTRAVENOUS | Status: DC | PRN
  Administered 2016-08-06: 30 mg via INTRAVENOUS
  Administered 2016-08-06: 10 mg via INTRAVENOUS

## 2016-08-06 MED ORDER — FERROUS SULFATE 325 (65 FE) MG PO TABS
325.0000 mg | ORAL_TABLET | Freq: Two times a day (BID) | ORAL | Status: DC
  Administered 2016-08-07: 325 mg via ORAL
  Filled 2016-08-06: qty 1

## 2016-08-06 MED ORDER — ONDANSETRON HCL 4 MG/2ML IJ SOLN
INTRAMUSCULAR | Status: AC
  Filled 2016-08-06: qty 2

## 2016-08-06 MED ORDER — IPRATROPIUM-ALBUTEROL 0.5-2.5 (3) MG/3ML IN SOLN
3.0000 mL | Freq: Once | RESPIRATORY_TRACT | Status: AC
  Administered 2016-08-06: 3 mL via RESPIRATORY_TRACT

## 2016-08-06 MED ORDER — OXYCODONE HCL 5 MG PO TABS
5.0000 mg | ORAL_TABLET | ORAL | Status: DC | PRN
  Administered 2016-08-06 – 2016-08-07 (×3): 5 mg via ORAL
  Filled 2016-08-06 (×3): qty 1

## 2016-08-06 MED ORDER — SEVOFLURANE IN SOLN
RESPIRATORY_TRACT | Status: AC
  Filled 2016-08-06: qty 250

## 2016-08-06 MED ORDER — MIRABEGRON ER 25 MG PO TB24
25.0000 mg | ORAL_TABLET | Freq: Every day | ORAL | Status: DC
  Administered 2016-08-07: 25 mg via ORAL
  Filled 2016-08-06: qty 1

## 2016-08-06 MED ORDER — ACETAMINOPHEN 10 MG/ML IV SOLN
INTRAVENOUS | Status: AC
  Filled 2016-08-06: qty 100

## 2016-08-06 MED ORDER — PROPOFOL 10 MG/ML IV BOLUS
INTRAVENOUS | Status: AC
  Filled 2016-08-06: qty 20

## 2016-08-06 MED ORDER — CETIRIZINE HCL 10 MG PO TABS
10.0000 mg | ORAL_TABLET | Freq: Every evening | ORAL | Status: DC
  Administered 2016-08-06: 10 mg via ORAL
  Filled 2016-08-06: qty 1

## 2016-08-06 MED ORDER — NEOMYCIN-POLYMYXIN B GU 40-200000 IR SOLN
Status: AC
  Filled 2016-08-06: qty 2

## 2016-08-06 MED ORDER — DABIGATRAN ETEXILATE MESYLATE 150 MG PO CAPS
150.0000 mg | ORAL_CAPSULE | Freq: Two times a day (BID) | ORAL | Status: DC
  Administered 2016-08-07: 150 mg via ORAL
  Filled 2016-08-06 (×2): qty 1

## 2016-08-06 MED ORDER — SUGAMMADEX SODIUM 500 MG/5ML IV SOLN
INTRAVENOUS | Status: DC | PRN
  Administered 2016-08-06: 230 mg via INTRAVENOUS

## 2016-08-06 MED ORDER — DOCUSATE SODIUM 100 MG PO CAPS
100.0000 mg | ORAL_CAPSULE | Freq: Two times a day (BID) | ORAL | Status: DC
  Administered 2016-08-07: 100 mg via ORAL
  Filled 2016-08-06: qty 1

## 2016-08-06 MED ORDER — ACETAMINOPHEN 500 MG PO TABS
1000.0000 mg | ORAL_TABLET | Freq: Three times a day (TID) | ORAL | Status: DC | PRN
Start: 2016-08-06 — End: 2016-08-06

## 2016-08-06 MED ORDER — DEXAMETHASONE SODIUM PHOSPHATE 10 MG/ML IJ SOLN
INTRAMUSCULAR | Status: DC | PRN
  Administered 2016-08-06: 10 mg via INTRAVENOUS

## 2016-08-06 MED ORDER — SUGAMMADEX SODIUM 500 MG/5ML IV SOLN
INTRAVENOUS | Status: AC
  Filled 2016-08-06: qty 5

## 2016-08-06 MED ORDER — LIDOCAINE HCL (CARDIAC) 20 MG/ML IV SOLN
INTRAVENOUS | Status: DC | PRN
  Administered 2016-08-06: 100 mg via INTRAVENOUS

## 2016-08-06 MED ORDER — PANTOPRAZOLE SODIUM 40 MG PO TBEC
40.0000 mg | DELAYED_RELEASE_TABLET | Freq: Every day | ORAL | Status: DC
  Administered 2016-08-07: 40 mg via ORAL
  Filled 2016-08-06: qty 1

## 2016-08-06 MED ORDER — ALBUTEROL SULFATE (2.5 MG/3ML) 0.083% IN NEBU: 2.5000 mg | INHALATION_SOLUTION | Freq: Four times a day (QID) | RESPIRATORY_TRACT | Status: DC | PRN

## 2016-08-06 MED ORDER — GABAPENTIN 800 MG PO TABS
800.0000 mg | ORAL_TABLET | Freq: Four times a day (QID) | ORAL | Status: DC
  Filled 2016-08-06: qty 1

## 2016-08-06 MED ORDER — METFORMIN HCL 500 MG PO TABS
500.0000 mg | ORAL_TABLET | Freq: Every day | ORAL | Status: DC
  Administered 2016-08-06: 500 mg via ORAL
  Filled 2016-08-06: qty 1

## 2016-08-06 MED ORDER — BUPIVACAINE HCL (PF) 0.5 % IJ SOLN
INTRAMUSCULAR | Status: DC | PRN
  Administered 2016-08-06: 15 mL

## 2016-08-06 MED ORDER — FENTANYL CITRATE (PF) 100 MCG/2ML IJ SOLN
INTRAMUSCULAR | Status: AC
  Administered 2016-08-06: 25 ug via INTRAVENOUS
  Filled 2016-08-06: qty 2

## 2016-08-06 MED ORDER — METOCLOPRAMIDE HCL 5 MG/ML IJ SOLN: 5.0000 mg | Freq: Three times a day (TID) | INTRAMUSCULAR | Status: DC | PRN

## 2016-08-06 MED ORDER — LIDOCAINE HCL (PF) 2 % IJ SOLN
INTRAMUSCULAR | Status: AC
  Filled 2016-08-06: qty 2

## 2016-08-06 MED ORDER — ACETAMINOPHEN 10 MG/ML IV SOLN
INTRAVENOUS | Status: DC | PRN
  Administered 2016-08-06: 1000 mg via INTRAVENOUS

## 2016-08-06 MED ORDER — CLINDAMYCIN PHOSPHATE 900 MG/50ML IV SOLN
INTRAVENOUS | Status: AC
  Filled 2016-08-06: qty 50

## 2016-08-06 MED ORDER — FLUCONAZOLE 100 MG PO TABS: 100.0000 mg | ORAL_TABLET | Freq: Every day | ORAL | 1 refills | Status: DC

## 2016-08-06 MED ORDER — ALPRAZOLAM 0.5 MG PO TABS: 0.5000 mg | ORAL_TABLET | Freq: Two times a day (BID) | ORAL | Status: DC | PRN

## 2016-08-06 MED ORDER — MELOXICAM 7.5 MG PO TABS
15.0000 mg | ORAL_TABLET | Freq: Every day | ORAL | Status: DC
  Administered 2016-08-07: 15 mg via ORAL
  Filled 2016-08-06: qty 2

## 2016-08-06 MED ORDER — POTASSIUM CHLORIDE IN NACL 20-0.9 MEQ/L-% IV SOLN
INTRAVENOUS | Status: DC
  Filled 2016-08-06 (×3): qty 1000

## 2016-08-06 MED ORDER — PROPOFOL 10 MG/ML IV BOLUS
INTRAVENOUS | Status: DC | PRN
  Administered 2016-08-06: 100 mg via INTRAVENOUS

## 2016-08-06 MED ORDER — FENTANYL CITRATE (PF) 100 MCG/2ML IJ SOLN
INTRAMUSCULAR | Status: DC | PRN
  Administered 2016-08-06: 100 ug via INTRAVENOUS

## 2016-08-06 MED ORDER — OXYBUTYNIN CHLORIDE ER 10 MG PO TB24
10.0000 mg | ORAL_TABLET | Freq: Every day | ORAL | Status: DC
  Administered 2016-08-06: 10 mg via ORAL
  Filled 2016-08-06: qty 1

## 2016-08-06 MED ORDER — DULOXETINE HCL 60 MG PO CPEP
60.0000 mg | ORAL_CAPSULE | Freq: Every evening | ORAL | Status: DC
  Administered 2016-08-06: 60 mg via ORAL
  Filled 2016-08-06: qty 1

## 2016-08-06 MED ORDER — ACETAMINOPHEN 650 MG RE SUPP: 650.0000 mg | Freq: Four times a day (QID) | RECTAL | Status: DC | PRN

## 2016-08-06 MED ORDER — QUINAPRIL HCL 10 MG PO TABS
20.0000 mg | ORAL_TABLET | Freq: Every day | ORAL | Status: DC
  Administered 2016-08-07: 20 mg via ORAL
  Filled 2016-08-06: qty 2

## 2016-08-06 MED ORDER — METOCLOPRAMIDE HCL 10 MG PO TABS: 5.0000 mg | ORAL_TABLET | Freq: Three times a day (TID) | ORAL | Status: DC | PRN

## 2016-08-06 MED ORDER — ONDANSETRON HCL 4 MG/2ML IJ SOLN: 4.0000 mg | Freq: Four times a day (QID) | INTRAMUSCULAR | Status: DC | PRN

## 2016-08-06 MED ORDER — ACETAMINOPHEN 325 MG PO TABS: 650.0000 mg | ORAL_TABLET | Freq: Four times a day (QID) | ORAL | Status: DC | PRN

## 2016-08-06 MED ORDER — ONDANSETRON HCL 4 MG PO TABS: 4.0000 mg | ORAL_TABLET | Freq: Four times a day (QID) | ORAL | Status: DC | PRN

## 2016-08-06 MED ORDER — OXYCODONE HCL 5 MG PO TABS: 5.0000 mg | ORAL_TABLET | ORAL | 0 refills | Status: DC | PRN

## 2016-08-06 MED ORDER — NEOMYCIN-POLYMYXIN B GU 40-200000 IR SOLN
Status: DC | PRN
  Administered 2016-08-06: 2 mL

## 2016-08-06 MED ORDER — MONTELUKAST SODIUM 10 MG PO TABS
10.0000 mg | ORAL_TABLET | Freq: Every evening | ORAL | Status: DC
  Administered 2016-08-06: 10 mg via ORAL
  Filled 2016-08-06: qty 1

## 2016-08-06 MED ORDER — ONDANSETRON HCL 4 MG/2ML IJ SOLN: 4.0000 mg | Freq: Once | INTRAMUSCULAR | Status: DC | PRN

## 2016-08-06 MED ORDER — LEVOTHYROXINE SODIUM 75 MCG PO TABS
150.0000 ug | ORAL_TABLET | Freq: Every morning | ORAL | Status: DC
  Administered 2016-08-07: 150 ug via ORAL
  Filled 2016-08-06: qty 2

## 2016-08-06 MED ORDER — FLEET ENEMA 7-19 GM/118ML RE ENEM: 1.0000 | ENEMA | Freq: Once | RECTAL | Status: DC | PRN

## 2016-08-06 MED ORDER — DIPHENHYDRAMINE HCL 12.5 MG/5ML PO ELIX: 12.5000 mg | ORAL_SOLUTION | ORAL | Status: DC | PRN

## 2016-08-06 MED ORDER — EPHEDRINE SULFATE 50 MG/ML IJ SOLN
INTRAMUSCULAR | Status: DC | PRN
Start: 2016-08-06 — End: 2016-08-06
  Administered 2016-08-06 (×3): 10 mg via INTRAVENOUS

## 2016-08-06 MED ORDER — POTASSIUM CHLORIDE IN NACL 20-0.9 MEQ/L-% IV SOLN
INTRAVENOUS | Status: DC
  Administered 2016-08-06: 21:00:00 via INTRAVENOUS
  Filled 2016-08-06 (×2): qty 1000

## 2016-08-06 MED ORDER — FUROSEMIDE 10 MG/ML IJ SOLN
20.0000 mg | Freq: Once | INTRAMUSCULAR | Status: AC
  Administered 2016-08-06: 20 mg via INTRAVENOUS

## 2016-08-06 MED ORDER — HYDROCORTISONE 1 % EX CREA
1.0000 "application " | TOPICAL_CREAM | Freq: Every day | CUTANEOUS | Status: DC | PRN
  Filled 2016-08-06: qty 28

## 2016-08-06 MED ORDER — IPRATROPIUM-ALBUTEROL 0.5-2.5 (3) MG/3ML IN SOLN
3.0000 mL | RESPIRATORY_TRACT | Status: DC
  Administered 2016-08-06: 3 mL via RESPIRATORY_TRACT

## 2016-08-06 MED ORDER — FENTANYL CITRATE (PF) 100 MCG/2ML IJ SOLN
25.0000 ug | INTRAMUSCULAR | Status: DC | PRN
  Administered 2016-08-06 (×2): 25 ug via INTRAVENOUS

## 2016-08-06 MED ORDER — SODIUM CHLORIDE 0.9 % IV SOLN
INTRAVENOUS | Status: DC
  Administered 2016-08-06: 12:00:00 via INTRAVENOUS

## 2016-08-06 MED ORDER — BUPIVACAINE HCL (PF) 0.5 % IJ SOLN
INTRAMUSCULAR | Status: AC
  Filled 2016-08-06: qty 30

## 2016-08-06 MED ORDER — ONDANSETRON HCL 4 MG/2ML IJ SOLN
INTRAMUSCULAR | Status: DC | PRN
  Administered 2016-08-06: 4 mg via INTRAVENOUS

## 2016-08-06 MED ORDER — SUCCINYLCHOLINE CHLORIDE 20 MG/ML IJ SOLN
INTRAMUSCULAR | Status: DC | PRN
  Administered 2016-08-06: 100 mg via INTRAVENOUS

## 2016-08-06 MED ORDER — MAGNESIUM HYDROXIDE 400 MG/5ML PO SUSP: 30.0000 mL | Freq: Every day | ORAL | Status: DC | PRN

## 2016-08-06 MED ORDER — BUPROPION HCL ER (XL) 300 MG PO TB24
300.0000 mg | ORAL_TABLET | Freq: Every day | ORAL | Status: DC
  Administered 2016-08-07: 300 mg via ORAL
  Filled 2016-08-06: qty 1

## 2016-08-06 MED ORDER — ROCURONIUM BROMIDE 50 MG/5ML IV SOLN
INTRAVENOUS | Status: AC
  Filled 2016-08-06: qty 1

## 2016-08-06 MED ORDER — METOPROLOL TARTRATE 25 MG PO TABS
25.0000 mg | ORAL_TABLET | Freq: Two times a day (BID) | ORAL | Status: DC
  Administered 2016-08-06 – 2016-08-07 (×2): 25 mg via ORAL
  Filled 2016-08-06 (×2): qty 1

## 2016-08-06 MED ORDER — FUROSEMIDE 10 MG/ML IJ SOLN
INTRAMUSCULAR | Status: AC
  Administered 2016-08-06: 20 mg via INTRAVENOUS
  Filled 2016-08-06: qty 2

## 2016-08-06 MED ORDER — FENTANYL CITRATE (PF) 100 MCG/2ML IJ SOLN
INTRAMUSCULAR | Status: AC
  Filled 2016-08-06: qty 2

## 2016-08-06 MED ORDER — MIDAZOLAM HCL 2 MG/2ML IJ SOLN
INTRAMUSCULAR | Status: DC | PRN
  Administered 2016-08-06: 2 mg via INTRAVENOUS

## 2016-08-06 MED ORDER — MIDAZOLAM HCL 2 MG/2ML IJ SOLN
INTRAMUSCULAR | Status: AC
  Filled 2016-08-06: qty 2

## 2016-08-06 MED ORDER — IPRATROPIUM-ALBUTEROL 0.5-2.5 (3) MG/3ML IN SOLN
3.0000 mL | Freq: Four times a day (QID) | RESPIRATORY_TRACT | Status: DC
  Administered 2016-08-07: 3 mL via RESPIRATORY_TRACT
  Filled 2016-08-06 (×2): qty 3

## 2016-08-06 SURGICAL SUPPLY — 31 items
BANDAGE ELASTIC 4 LF NS (GAUZE/BANDAGES/DRESSINGS) ×2 IMPLANT
BNDG CMPR MED 5X4 ELC HKLP NS (GAUZE/BANDAGES/DRESSINGS) ×1
BNDG COHESIVE 4X5 TAN STRL (GAUZE/BANDAGES/DRESSINGS) ×2 IMPLANT
BNDG ESMARK 4X12 TAN STRL LF (GAUZE/BANDAGES/DRESSINGS) ×2 IMPLANT
CANISTER SUCT 1200ML W/VALVE (MISCELLANEOUS) ×2 IMPLANT
CHLORAPREP W/TINT 26ML (MISCELLANEOUS) ×2 IMPLANT
CORD BIP STRL DISP 12FT (MISCELLANEOUS) ×2 IMPLANT
CUFF TOURN 18 STER (MISCELLANEOUS) ×2 IMPLANT
CUFF TOURN 24 STER (MISCELLANEOUS) IMPLANT
ELECT REM PT RETURN 9FT ADLT (ELECTROSURGICAL) ×2
ELECTRODE REM PT RTRN 9FT ADLT (ELECTROSURGICAL) ×1 IMPLANT
FORCEPS JEWEL BIP 4-3/4 STR (INSTRUMENTS) ×2 IMPLANT
GAUZE PETRO XEROFOAM 1X8 (MISCELLANEOUS) ×2 IMPLANT
GAUZE SPONGE 4X4 12PLY STRL (GAUZE/BANDAGES/DRESSINGS) ×2 IMPLANT
GLOVE INDICATOR 8.0 STRL GRN (GLOVE) ×2 IMPLANT
GOWN STRL REUS W/ TWL LRG LVL3 (GOWN DISPOSABLE) ×1 IMPLANT
GOWN STRL REUS W/ TWL XL LVL3 (GOWN DISPOSABLE) ×1 IMPLANT
GOWN STRL REUS W/TWL LRG LVL3 (GOWN DISPOSABLE) ×2
GOWN STRL REUS W/TWL XL LVL3 (GOWN DISPOSABLE) ×2
KIT RM TURNOVER STRD PROC AR (KITS) ×2 IMPLANT
LOOP RED MAXI  1X406MM (MISCELLANEOUS) ×1
LOOP VESSEL MAXI 1X406 RED (MISCELLANEOUS) ×1 IMPLANT
NS IRRIG 500ML POUR BTL (IV SOLUTION) ×2 IMPLANT
PACK EXTREMITY ARMC (MISCELLANEOUS) ×2 IMPLANT
PAD ABD DERMACEA PRESS 5X9 (GAUZE/BANDAGES/DRESSINGS) IMPLANT
PAD CAST CTTN 4X4 STRL (SOFTGOODS) IMPLANT
PADDING CAST COTTON 4X4 STRL (SOFTGOODS)
SLING ARM LRG DEEP (SOFTGOODS) ×2 IMPLANT
STAPLER SKIN PROX 35W (STAPLE) ×2 IMPLANT
STOCKINETTE IMPERVIOUS 9X36 MD (GAUZE/BANDAGES/DRESSINGS) ×2 IMPLANT
SUT VIC AB 2-0 CT1 36 (SUTURE) ×2 IMPLANT

## 2016-08-06 NOTE — Anesthesia Post-op Follow-up Note (Cosign Needed)
Anesthesia QCDR form completed.        

## 2016-08-06 NOTE — Anesthesia Postprocedure Evaluation (Signed)
Anesthesia Post Note  Patient: [name]  Procedure(s) Performed: Procedure(s) (LRB): ULNAR NERVE DECOMPRESSION/TRANSPOSITION (Right)  Patient location during evaluation: PACU Anesthesia Type: General Level of consciousness: awake and alert Pain management: pain level controlled Vital Signs Assessment: post-procedure vital signs reviewed and stable Respiratory status: spontaneous breathing, nonlabored ventilation, respiratory function stable and patient connected to nasal cannula oxygen Cardiovascular status: blood pressure returned to baseline and stable Postop Assessment: no signs of nausea or vomiting Anesthetic complications: no     Last Vitals:  Vitals:   08/06/16 1532 08/06/16 1533  BP: 125/89 125/89  Pulse: (!) 57 (!) 58  Resp: 12 (!) 6  Temp:      Last Pain:  Vitals:   08/06/16 1532  TempSrc:   PainSc: [place]

## 2016-08-06 NOTE — Progress Notes (Signed)
Patient's Name: [name]  MRN: [id number]  Referring Provider: [name], DO  DOB: 18-Nov-1950  PCP: [name], DO  DOS: 08/18/2016  Note by: Vevelyn Francois NP  Service setting: Ambulatory outpatient  Specialty: Interventional Pain Management  Location: [facility] (AMB) Pain Management Facility    Patient type: Established    Primary Reason(s) for Visit: Encounter for prescription drug management (Level of risk: moderate) CC: Foot Pain (bilaterally- left worse); Knee Pain (bilaterally); and Extremity Laceration (right)  HPI  [name] is a 66 y.o. year old, female patient, who comes today for a medication management evaluation. She has Paroxysmal atrial fibrillation ([place]); CHA2DS2-Vasc Score 5. On Pradaxa; Essential hypertension; Peripheral vascular disease due to secondary diabetes mellitus ([place]); Apnea, sleep; DM (diabetes mellitus) type II controlled peripheral vascular disorder ([place]); Diabetic peripheral neuropathy associated with type 2 diabetes mellitus ([place]); Asthma, moderate persistent, well-controlled; Hypothyroidism, adult; Anxiety and depression; Bladder cystocele; Gastro-esophageal reflux disease without esophagitis; Mixed incontinence; Atrophic vaginitis; Opiate use (15 MME/Day); Lateral femoral cutaneous entrapment syndrome (Right); Other shoulder lesions (Right); Neuropathy of right lateral femoral cutaneous nerve; Morbid obesity with BMI of 40.0-44.9, adult ([place]); Chronic prescription benzodiazepine use; Osteoarthritis, multiple sites; Status post reverse total shoulder replacement; DOE (dyspnea on exertion); Chronic pain syndrome; Long term current use of opiate analgesic; Chronic right shoulder pain; Upper respiratory tract infection; Low oxygen saturation; and Shortness of breath on her problem list. Her primarily concern today is the Foot Pain (bilaterally- left worse); Knee Pain (bilaterally); and Extremity Laceration (right)  Pain Assessment: Self-Reported Pain Score: 5  /10 Clinically the patient looks like a 2/10 Reported level is inconsistent with clinical observations. Information on the proper use of the pain scale provided to the patient today Pain Type: Chronic pain Pain Location: Foot Pain Orientation: Right, Left ( left worse) Pain Descriptors / Indicators: Burning, Constant, Throbbing Pain Frequency: Constant  [name] was last scheduled for an appointment on 05/19/16 for medication management. During today's appointment we reviewed [name]'s chronic pain status, as well as her outpatient medication regimen. She has chronic feet pain. She admits that this is related to her diabetes. She also admits that her Diabetes is under control. She has numbness, tingling and weakness. She states that she is no longer needing sleep aides. She is very excited that she is able to rest without additional assistance.   The patient  reports that she does not use drugs. Her body mass index is 41.91 kg/m.  Further details on both, my assessment(s), as well as the proposed treatment plan, please see below.  Controlled Substance Pharmacotherapy Assessment REMS (Risk Evaluation and Mitigation Strategy)  Analgesic:Hydrocodone/APAP 5/325 one tablet every 8 hours (15 mg/day) MME/day:15 mg/day Dawn Specking, RN  08/18/2016 11:59 AM  Sign at close encounter Nursing Pain Medication Assessment:  Safety precautions to be maintained throughout the outpatient stay will include: orient to surroundings, keep bed in low position, maintain call bell within reach at all times, provide assistance with transfer out of bed and ambulation.  Medication Inspection Compliance: Pill count conducted under aseptic conditions, in front of the patient. Neither the pills nor the bottle was removed from the patient's sight at any time. Once count was completed pills were immediately returned to the patient in their original bottle.  Medication: See above Pill/Patch Count: 74 of 90 pills  remain Pill/Patch Appearance: Markings consistent with prescribed medication Bottle Appearance: Standard pharmacy container. Clearly labeled. Filled Date: 5 / 76 / 2018 Last  Medication intake:  Today   Pharmacokinetics: Liberation and absorption (onset of action): WNL Distribution (time to peak effect): WNL Metabolism and excretion (duration of action): WNL         Pharmacodynamics: Desired effects: Analgesia: Ms. Shortridge reports >50% benefit. Functional ability: Patient reports that medication allows her to accomplish basic ADLs Clinically meaningful improvement in function (CMIF): Sustained CMIF goals met Perceived effectiveness: Described as relatively effective, allowing for increase in activities of daily living (ADL) Undesirable effects: Side-effects or Adverse reactions: None reported Monitoring: [place] PMP: Online review of the past 21-monthperiod conducted. Compliant with practice rules and regulations List of all UDS test(s) done:  Lab Results  Component Value Date   TOXASSSELUR FINAL 05/19/2016   [place] 09/17/2015   TOXASSSELUR FINAL 07/12/2015   TOXASSSELUR FINAL 04/17/2015   TOXASSSELUR FINAL 01/16/2015   Last UDS on record: ToxAssure Select 13  Date Value Ref Range Status  05/19/2016 FINAL  Final    Comment:    ==================================================================== TOXASSURE SELECT 13 (MW) ==================================================================== Test                             Result       Flag       Units Drug Present and Declared for Prescription Verification   Alpha-hydroxyalprazolam        92           EXPECTED   ng/mg creat    Alpha-hydroxyalprazolam is an expected metabolite of alprazolam.    Source of alprazolam is a scheduled prescription medication. Drug Absent but Declared for Prescription Verification   Hydrocodone                    Not Detected UNEXPECTED ng/mg  creat ==================================================================== Test                      Result    Flag   Units      Ref Range   Creatinine              106              mg/dL      >=20 ==================================================================== Declared Medications:  The flagging and interpretation on this report are based on the  following declared medications.  Unexpected results may arise from  inaccuracies in the declared medications.  **Note: The testing scope of this panel includes these medications:  Alprazolam  Hydrocodone (Hydrocodone-Acetaminophen)  **Note: The testing scope of this panel does not include following  reported medications:  Acetaminophen  Acetaminophen (Hydrocodone-Acetaminophen)  Albuterol  Bupropion  Dabigatran  Duloxetine  Estradiol  Fluticasone  Gabapentin  Levothyroxine  Melatonin  Meloxicam  Metformin  Metoprolol  Mirabegron  Montelukast  Oxybutynin  Pantoprazole  Quinapril ==================================================================== For clinical consultation, please call ([phone] ====================================================================    UDS interpretation: Compliant          Medication Assessment Form: Reviewed. Patient indicates being compliant with therapy Treatment compliance: Compliant Risk Assessment Profile: Aberrant behavior: See prior evaluations. None observed or detected today Comorbid factors increasing risk of overdose: See prior notes. No additional risks detected today Risk of substance use disorder (SUD): Low Opioid Risk Tool (ORT) Total Score: 3  Interpretation Table:  Score <3 = Low Risk for SUD  Score between 4-7 = Moderate Risk for SUD  Score >8 = High Risk for Opioid Abuse   Risk Mitigation Strategies:  Patient  Counseling: Covered Patient-Prescriber Agreement (PPA): Present and active  Notification to other healthcare providers: Done  Pharmacologic Plan: No  change in therapy, at this time  Laboratory Chemistry  Inflammation Markers Lab Results  Component Value Date   CRP 0.6 04/17/2015   ESRSEDRATE 12 04/17/2015   (CRP: Acute Phase) (ESR: Chronic Phase) Renal Function Markers Lab Results  Component Value Date   BUN 19 06/09/2016   CREATININE 0.89 08/06/2016   GFRAA >60 08/06/2016   GFRNONAA >60 08/06/2016   Hepatic Function Markers Lab Results  Component Value Date   AST 11 06/09/2016   ALT 9 06/09/2016   ALBUMIN 4.2 06/09/2016   ALKPHOS 83 06/09/2016   Electrolytes Lab Results  Component Value Date   NA 141 06/09/2016   K 4.8 07/28/2016   CL 106 06/09/2016   CALCIUM 9.4 06/09/2016   MG 1.8 04/17/2015   Neuropathy Markers No results found for: [id number] Bone Pathology Markers Lab Results  Component Value Date   ALKPHOS 83 06/09/2016   CALCIUM 9.4 06/09/2016   Coagulation Parameters Lab Results  Component Value Date   INR 1.18 09/26/2015   LABPROT 15.2 (H) 09/26/2015   APTT 42 (H) 06/24/2015   PLT 200 08/06/2016   Cardiovascular Markers Lab Results  Component Value Date   BNP 157.0 (H) 10/09/2015   HGB 11.4 (L) 08/06/2016   HCT 36.1 08/06/2016   Note: Lab results reviewed.  Recent Diagnostic Imaging Review  No results found. Note: Imaging results reviewed.          Meds  The patient has a current medication list which includes the following prescription(s): acetaminophen, albuterol, albuterol, alprazolam, bupropion, dabigatran, dexlansoprazole, duloxetine, estrace vaginal, ferrous sulfate, fluconazole, fluticasone-salmeterol, hydrocortisone cream, ipratropium, levocetirizine, levothyroxine, meloxicam, metformin, metoprolol tartrate, mirabegron er, montelukast, oxybutynin, quinapril, gabapentin, hydrocodone-acetaminophen, hydrocodone-acetaminophen, and hydrocodone-acetaminophen.  Current Outpatient Prescriptions on File Prior to Visit  Medication Sig  . acetaminophen (TYLENOL) 500 MG tablet Take 1,000  mg by mouth every 8 (eight) hours as needed for mild pain.  Marland Kitchen albuterol (PROVENTIL HFA;VENTOLIN HFA) 108 (90 Base) MCG/ACT inhaler Inhale 1-2 puffs into the lungs every 4 (four) hours as needed for wheezing or shortness of breath. (Patient taking differently: Inhale 2 puffs into the lungs every 4 (four) hours as needed for wheezing or shortness of breath. )  . albuterol (PROVENTIL) (2.5 MG/3ML) 0.083% nebulizer solution Take 3 mLs (2.5 mg total) by nebulization every 6 (six) hours as needed for wheezing or shortness of breath.  . ALPRAZolam (XANAX) 0.5 MG tablet TAKE ONE TABLET TWICE DAILY AS NEEDED  . buPROPion (WELLBUTRIN XL) 300 MG 24 hr tablet TAKE ONE TABLET BY MOUTH EVERY DAY  . dabigatran (PRADAXA) 150 MG CAPS capsule TAKE 1 CAPSULE BY MOUTH TWICE DAILY  . Dexlansoprazole 30 MG capsule Take 1 capsule (30 mg total) by mouth daily.  Marland Kitchen ESTRACE VAGINAL 0.1 MG/GM vaginal cream INSERT 1 GRAM VAGINALLY TWICE A WEEK  . ferrous sulfate 325 (65 FE) MG tablet Take 1 tablet (325 mg total) by mouth 2 (two) times daily.  . fluconazole (DIFLUCAN) 100 MG tablet Take 1 tablet (100 mg total) by mouth daily.  . fluticasone-salmeterol (ADVAIR HFA) 115-21 MCG/ACT inhaler Inhale 2 puffs into the lungs 2 (two) times daily.  . hydrocortisone cream 1 % Apply 1 application topically daily as needed for itching.  Marland Kitchen ipratropium (ATROVENT) 0.06 % nasal spray Place 2 sprays into both nostrils 4 (four) times daily. (Patient taking differently: Place 2 sprays into both nostrils 2 (  two) times daily as needed for rhinitis. )  . levocetirizine (XYZAL) 5 MG tablet Take 1 tablet (5 mg total) by mouth every evening.  Marland Kitchen levothyroxine (SYNTHROID, LEVOTHROID) 150 MCG tablet TAKE ONE TABLET BY MOUTH EVERY MORNING  . meloxicam (MOBIC) 15 MG tablet Take 15 mg by mouth daily.   . metFORMIN (GLUCOPHAGE) 1000 MG tablet TAKE ONE TABLET BY MOUTH TWICE DAILY (Patient taking differently: TAKE ONE TABLET IN THE MORNING AND HALF TABLET AT NIGHT)   . metoprolol tartrate (LOPRESSOR) 25 MG tablet TAKE ONE TABLET TWICE DAILY  . mirabegron ER (MYRBETRIQ) 25 MG TB24 tablet Take 1 tablet (25 mg total) by mouth daily.  . montelukast (SINGULAIR) 10 MG tablet TAKE ONE TABLET BY MOUTH EVERY DAY  . oxybutynin (DITROPAN-XL) 10 MG 24 hr tablet Take 1 tablet (10 mg total) by mouth daily. (Patient taking differently: Take 10 mg by mouth at bedtime. )  . quinapril (ACCUPRIL) 20 MG tablet TAKE ONE TABLET EVERY DAY   No current facility-administered medications on file prior to visit.    ROS  Constitutional: Denies any fever or chills Gastrointestinal: No reported hemesis, hematochezia, vomiting, or acute GI distress Musculoskeletal: Denies any acute onset joint swelling, redness, loss of ROM, or weakness Neurological: No reported episodes of acute onset apraxia, aphasia, dysarthria, agnosia, amnesia, paralysis, loss of coordination, or loss of consciousness  Allergies  Ms. Bribiesca is allergic to penicillins; aspirin; and terramycin [oxytetracycline].  [place]  Drug: Ms. Wahler  reports that she does not use drugs. Alcohol:  reports that she does not drink alcohol. Tobacco:  reports that she has never smoked. She has never used smokeless tobacco. Medical:  has a past medical history of Anxiety and depression; Arthritis; Asthma; Cystocele; Depression; Diabetes ([place]); DVT (deep venous thrombosis) ([place]); GERD (gastroesophageal reflux disease); Heart murmur; History of IBS; History of methicillin resistant staphylococcus aureus (MRSA) (2008); Hypothyroidism; Insomnia; Kidney stone; Neuropathy involving both lower extremities; Obesity, Class II, BMI 35-39.9, with comorbidity; Paroxysmal atrial fibrillation ([place]) (2007); Peripheral vascular disease ([place]); Sleep apnea; and Venous stasis dermatitis of both lower extremities. Family: family history includes Alcohol abuse in her father; Arthritis in her maternal grandfather, maternal grandmother, mother, paternal  grandfather, and paternal grandmother; Breast cancer in her maternal aunt; Cancer in her father and maternal aunt; Cerebral aneurysm in her father; Diabetes in her father, maternal grandmother, and paternal grandmother; Heart disease in her maternal grandfather; Hypertension in her father, maternal grandfather, maternal grandmother, paternal grandfather, and paternal grandmother; Kidney disease in her mother; Mental illness in her sister; Peripheral vascular disease in her father; Skin cancer in her father; Varicose Veins in her mother.  Past Surgical History:  Procedure Laterality Date  . ANKLE SURGERY Right   . APPLICATION OF WOUND VAC Left 06/27/2015   Procedure: APPLICATION OF WOUND VAC ( POSSIBLE ) ;  Surgeon: [name], MD;  Location: [facility] ORS;  Service: Vascular;  Laterality: Left;  . arm surgery     right    . CHOLECYSTECTOMY    . HERNIA REPAIR     umbilical  . HIATAL HERNIA REPAIR    . I&D EXTREMITY Left 06/27/2015   Procedure: IRRIGATION AND DEBRIDEMENT EXTREMITY            ( CALF HEMATOMA ) POSSIBLE WOUND VAC;  Surgeon: [name], MD;  Location: [facility] ORS;  Service: Vascular;  Laterality: Left;  . JOINT REPLACEMENT     left knee replacement  . KIDNEY SURGERY Right  kidney stones  . LITHOTRIPSY    . NM [facility] HX)  February 2017   Likely breast attenuation. LOW RISK study. Normal EF 55-60%.  . removal of left hematoma Left    leg  . REVERSE SHOULDER ARTHROPLASTY Right 10/08/2015   Procedure: REVERSE SHOULDER ARTHROPLASTY;  Surgeon: [name], MD;  Location: [facility] ORS;  Service: Orthopedics;  Laterality: Right;  . SLEEVE GASTROPLASTY    . TONSILLECTOMY    . TOTAL KNEE ARTHROPLASTY    . TRANSESOPHAGEAL ECHOCARDIOGRAM  08/08/2013   Mild LVH, EF 60-65%. Moderate LA dilation and mild RA dilation. Mild MR with no evidence of stenosis and no evidence of endocarditis. A false chordae is noted.  . TRANSTHORACIC ECHOCARDIOGRAM  08/03/2013   Mild-Moderate concentric LVH,  EF 60-65%. Normal diastolic function. Mild LA dilation. Mild MR with possible vegetation  - not confirmed on TEE   . ULNAR NERVE TRANSPOSITION Right 08/06/2016   Procedure: ULNAR NERVE DECOMPRESSION/TRANSPOSITION;  Surgeon: [name], MD;  Location: [facility] ORS;  Service: Orthopedics;  Laterality: Right;  Marland Kitchen VAGINAL HYSTERECTOMY     Constitutional Exam  General appearance: Well nourished, well developed, and well hydrated. In no apparent acute distress Vitals:   08/18/16 1144  BP: 138/65  Pulse: (!) 54  Resp: 16  Temp: 98.2 F (36.8 C)  SpO2: 99%  Weight: 248 lb (112.5 kg)  Height: 5' 4.5" (1.638 m)   BMI Assessment: Estimated body mass index is 41.91 kg/m as calculated from the following:   Height as of this encounter: 5' 4.5" (1.638 m).   Weight as of this encounter: 248 lb (112.5 kg).  BMI interpretation table: BMI level Category Range association with higher incidence of chronic pain  <18 kg/m2 Underweight   18.5-24.9 kg/m2 Ideal body weight   25-29.9 kg/m2 Overweight Increased incidence by 20%  30-34.9 kg/m2 Obese (Class I) Increased incidence by 68%  35-39.9 kg/m2 Severe obesity (Class II) Increased incidence by 136%  >40 kg/m2 Extreme obesity (Class III) Increased incidence by 254%   BMI Readings from Last 4 Encounters:  08/18/16 41.91 kg/m  08/06/16 42.90 kg/m  07/28/16 41.30 kg/m  06/09/16 42.47 kg/m   Wt Readings from Last 4 Encounters:  08/18/16 248 lb (112.5 kg)  08/06/16 257 lb 12.8 oz (116.9 kg)  07/28/16 252 lb (114.3 kg)  06/09/16 255 lb 3.2 oz (115.8 kg)  Psych/Mental status: Alert, oriented x 3 (person, place, & time)       Eyes: PERLA Respiratory: No evidence of acute respiratory distress  Upper Extremity (UE) Exam    Side: Right upper extremity  Side: Left upper extremity  Inspection: Ace wrap patent  Inspection: No masses, redness, swelling, or asymmetry. No contractures  Functional ROM: Unrestricted ROM          Functional ROM: Unrestricted  ROM          Muscle strength & Tone: Functionally intact  Muscle strength & Tone: Functionally intact  Sensory: Unimpaired  Sensory: Unimpaired  Palpation: No palpable anomalies              Palpation: No palpable anomalies              Specialized Test(s): Deferred         Specialized Test(s): Deferred          Gait & Posture Assessment  Ambulation: Unassisted Gait: Relatively normal for age and body habitus Posture: WNL   Lower Extremity Exam    Side: Right lower  extremity  Side: Left lower extremity  Inspection: discloration is legs.  deformity in toes,  Inspection: discloration is legs.  deformity in toes,   Functional ROM: Unrestricted ROM          Functional ROM: Unrestricted ROM          Muscle strength & Tone: Functionally intact  Muscle strength & Tone: Functionally intact  Sensory: Unimpaired  Sensory: Unimpaired  Palpation: No palpable anomalies 2+ pulses  Palpation: No palpable anomalies 2 + pulses   Assessment  Primary Diagnosis & Pertinent Problem List: The primary encounter diagnosis was Primary osteoarthritis involving multiple joints. Diagnoses of Diabetic peripheral neuropathy associated with type 2 diabetes mellitus ([place]), Chronic pain syndrome, Insomnia secondary to chronic pain, and Depression with anxiety were also pertinent to this visit.  Status Diagnosis  Controlled Controlled Controlled 1. Primary osteoarthritis involving multiple joints   2. Diabetic peripheral neuropathy associated with type 2 diabetes mellitus ([place])   3. Chronic pain syndrome   4. Insomnia secondary to chronic pain   5. Depression with anxiety     Problems updated and reviewed during this visit: Problem  Shortness of Breath  Paroxysmal atrial fibrillation ([place]); CHA2DS2-Vasc Score 5. On Pradaxa   This patients CHA2DS2-VASc Score and unadjusted Ischemic Stroke Rate (% per year) is equal to 7.2 % stroke rate/year from a score of 5  Above score calculated as 1 point each if present  [CHF, HTN, DM, Vascular=MI/PAD/Aortic Plaque, Age if 65-74, or Female] Above score calculated as 2 points each if present [Age > 75, or Stroke/TIA/TE]     Plan of Care  Pharmacotherapy (Medications Ordered): Meds ordered this encounter  Medications  . HYDROcodone-acetaminophen (NORCO/VICODIN) 5-325 MG tablet    Sig: Take 1 tablet by mouth every 8 (eight) hours as needed for severe pain.    Dispense:  90 tablet    Refill:  0    Do not add this medication to the electronic "Automatic Refill" notification system. Patient may have prescription filled one day early if pharmacy is closed on scheduled refill date. Do not fill until: 09/13/16 To last until: 10/13/16    Order Specific Question:   Supervising Provider    Answer:   Milinda Pointer [phone]  . HYDROcodone-acetaminophen (NORCO/VICODIN) 5-325 MG tablet    Sig: Take 1 tablet by mouth every 8 (eight) hours as needed for severe pain.    Dispense:  90 tablet    Refill:  0    Do not add this medication to the electronic "Automatic Refill" notification system. Patient may have prescription filled one day early if pharmacy is closed on scheduled refill date. Do not fill until: 10/13/16 To last until: 11/12/16    Order Specific Question:   Supervising Provider    Answer:   Milinda Pointer [phone]  . HYDROcodone-acetaminophen (NORCO/VICODIN) 5-325 MG tablet    Sig: Take 1 tablet by mouth every 8 (eight) hours as needed for severe pain.    Dispense:  90 tablet    Refill:  0    Do not add this medication to the electronic "Automatic Refill" notification system. Patient may have prescription filled one day early if pharmacy is closed on scheduled refill date. Do not fill until: 11/12/16 To last until: 12/12/16    Order Specific Question:   Supervising Provider    Answer:   Milinda Pointer [phone]  . gabapentin (NEURONTIN) 800 MG tablet    Sig: Take 1 tablet (800 mg total) by mouth 4 (four) times daily.  Dispense:  360 tablet     Refill:  0    Do not place this medication, or any other prescription from our practice, on "Automatic Refill". Patient may have prescription filled one day early if pharmacy is closed on scheduled refill date.    Order Specific Question:   Supervising Provider    Answer:   Milinda Pointer [phone]  . DULoxetine (CYMBALTA) 60 MG capsule    Sig: Take 1 capsule (60 mg total) by mouth daily.    Dispense:  90 capsule    Refill:  0    Do not place this medication, or any other prescription from our practice, on "Automatic Refill". Patient may have prescription filled one day early if pharmacy is closed on scheduled refill date.    Order Specific Question:   Supervising Provider    Answer:   Milinda Pointer [[id number]]   New Prescriptions   No medications on file   Medications administered today: Ms. Klahn had no medications administered during this visit. Lab-work, procedure(s), and/or referral(s): No orders of the defined types were placed in this encounter.  Imaging and/or referral(s): None  Interventional therapies: Planned, scheduled, and/or pending:   Continue with current regimen  (Always stop Pradaxa x 5 days prior to any blocks.)   Considering:   Diagnostic Right suprascapular nerve block Diagnostic right intra-articular knee injection  Possible series of 5 Hyalgan knee injections Diagnostic right-sided genicular nerve block Possible right-sided genicular nerve radiofrequency ablation  Palliative right intra-articular shoulder joint injection  Diagnostic right-sided suprascapular nerve block  Possible right-sided suprascapular nerve radiofrequency ablation  Palliative right lateral femoral cutaneous nerve block Palliative repeat right sided lateral femoral continuous nerve radiofrequency ablation   Palliative PRN treatment(s):   Diagnostic right intra-articular knee injection Possible series of 5 Hyalgan knee injections  Diagnostic right-sided genicular nerve  block  Palliative right intra-articular shoulder joint injection  Diagnostic right-sided suprascapular nerve block  Palliative right lateral femoral cutaneous nerve block Palliative repeat right sided lateral femoral continuous nerve radiofrequency ablation   Provider-requested follow-up: Return in about 3 months (around 11/18/2016) for Medication Mgmt.  Future Appointments Date Time Provider [place]  12/03/2016 2:45 PM McGowan, Marlowe Kays None   Primary Care Physician: [name], DO Location: [facility] Outpatient Pain Management Facility Note by: Vevelyn Francois NP Date: 08/18/2016; Time: 12:19 PM  Pain Score Disclaimer: We use the NRS-11 scale. This is a self-reported, subjective measurement of pain severity with only modest accuracy. It is used primarily to identify changes within a particular patient. It must be understood that outpatient pain scales are significantly less accurate that those used for research, where they can be applied under ideal controlled circumstances with minimal exposure to variables. In reality, the score is likely to be a combination of pain intensity and pain affect, where pain affect describes the degree of emotional arousal or changes in action readiness caused by the sensory experience of pain. Factors such as social and work situation, setting, emotional state, anxiety levels, expectation, and prior pain experience may influence pain perception and show large inter-individual differences that may also be affected by time variables.  Patient instructions provided during this appointment: Patient Instructions  ____________________________________________________________________________________________  Medication Rules  Applies to: All patients receiving prescriptions (written or electronic).  Pharmacy of record: Pharmacy where electronic prescriptions will be sent. If written prescriptions are taken to a different pharmacy, please  inform the nursing staff. The pharmacy listed in the electronic medical record should be  the one where you would like electronic prescriptions to be sent.  Prescription refills: Only during scheduled appointments. Applies to both, written and electronic prescriptions.  NOTE: The following applies primarily to controlled substances (Opioid Pain Medications)  Patient's responsibilities: 1. Pain Pills: Bring all pain pills to every appointment (except for procedure appointments). 2. Pill Bottles: Bring pills in original pharmacy bottle. Always bring newest bottle. Bring bottle, even if empty. 3. Medication refills: You are responsible for knowing and keeping track of what medications you need refilled. The day before your appointment, write a list of all prescriptions that need to be refilled. Bring that list to your appointment and give it to the admitting nurse. Prescriptions will be written only during appointments. If you forget a medication, it will not be "Called in", "Faxed", or "electronically sent". You will need to get another appointment to get these prescribed. 4. Prescription Accuracy: You are responsible for carefully inspecting your prescriptions before leaving our office. Have the discharge nurse carefully go over each prescription with you, before taking them home. Make sure that your name is accurately spelled, that your address is correct. Check the name and dose of your medication to make sure it is accurate. Check the number of pills, and the written instructions to make sure they are clear and accurate. Make sure that you are given enough medication to last until your next medication refill appointment. 5. Taking Medication: Take medication as prescribed. Never take more pills than instructed. Never take medication more frequently than prescribed. Taking less pills or less frequently is permitted and encouraged, when it comes to controlled substances (written prescriptions).  6. Inform  other Doctors: Always inform, all of your healthcare providers, of all the medications you take. 7. Pain Medication from other Providers: You are not allowed to accept any additional pain medication from any other Doctor or Healthcare provider. There are two exceptions to this rule. (see below) In the event that you require additional pain medication, you are responsible for notifying us, as stated below. 8. Medication Agreement: You are responsible for carefully reading and following our Medication Agreement. This must be signed before receiving any prescriptions from our practice. Safely store a copy of your signed Agreement. Violations to the Agreement will result in no further prescriptions. (Additional copies of our Medication Agreement are available upon request.) 9. Laws, Rules, & Regulations: All patients are expected to follow all Federal and [employer], Rules, [employer]. Ignorance of the Laws does not constitute a valid excuse.  Exceptions: There are only two exceptions to the rule of not receiving pain medications from other Healthcare Providers. 1. Exception #1 (Emergencies): In the event of an emergency (i.e.: accident requiring emergency care), you are allowed to receive additional pain medication. However, you are responsible for: As soon as you are able, call our office [phone], at any time of the day or night, and leave a message stating your name, the date and nature of the emergency, and the name and dose of the medication prescribed. In the event that your call is answered by a member of our staff, make sure to document and save the date, time, and the name of the person that took your information.  2. Exception #2 (Planned Surgery): In the event that you are scheduled by another doctor or dentist to have any type of surgery or procedure, you are allowed (for a period no longer than 30 days), to receive additional pain medication, for the acute post-op pain.  However,  in this case, you are responsible for picking up a copy of our "Post-op Pain Management for Surgeons" handout, and giving it to your surgeon or dentist. This document is available at our office, and does not require an appointment to obtain it. Simply go to our office during business hours (Monday-Thursday from 8:00 AM to 4:00 PM) (Friday 8:00 AM to 12:00 Noon) or if you have a scheduled appointment with [place], prior to your surgery, and ask for it by name. In addition, you will need to provide [place] with your name, name of your surgeon, type of surgery, and date of procedure or surgery.  _____________________________________________________________________________________________

## 2016-08-06 NOTE — Op Note (Signed)
08/06/2016  1:58 PM  Patient:   [name]  Pre-Op Diagnosis:   Right cubital tunnel syndrome.  Post-Op Diagnosis:   Same  Procedure:   Subcutaneous anterior transposition ulnar nerve, right elbow.  Surgeon:   Pascal Lux, MD  Assistant:   None  Anesthesia:   GET  Findings:   As above.  Complications:   None  EBL:   3 cc  Fluids:   700 cc crystalloid  TT:   46 minutes at 250 mmHg  Drains:   None  Closure:   Staples  Brief Clinical Note:   The patient is a 66 year old female with a history of progressively worsening ulnar-sided right hand pain and paresthesias. The patient's history and examination were consistent with cubital tunnel syndrome, confirmed by an EMG. The patient presents at this time for subcutaneous anterior transposition of the ulnar nerve at the right elbow.  Procedure:   The patient was brought into the operating room and lain in the supine position. After adequate general laryngal mask anesthesia was obtained, the patient's right upper extremity was prepped with ChloraPrep solution before being draped sterilely. Preoperative antibiotics were administered. After performing a timeout to verify the appropriate surgical site, the limb was exsanguinated with an Esmarch and the tourniquet inflated to 250 mmHg. An approximately 7-8 cm curvilinear incision was made along the course of the ulnar nerve posterior to the medial epicondyle. The incision was carried down through the subcutaneous tissues with care taken to avoid the small branches of the medial antebrachial nerve to expose the sheath overlying the cubital tunnel. The ulnar nerve was identified at the proximal end of the tunnel and was dissected free. The nerve was then carefully followed as the roof of the cubital tunnel was released from proximal to distal. Distally, the fascia overlying the pronator muscle was released for several centimeters. The nerve was clearly thickened just proximal to the  pronator fascia, indicating the most likely source of the nerve compression. A vessel loop was passed around the nerve and used to provide gentle traction on the nerve while circumferential dissection was carried out under loupe magnification using bipolar electrocautery and tenotomy scissors.   Once the nerve was fully mobilized, the anterior tissues were elevated as a flap just superficial to the fascia overlying the flexor wad and a pocket created to accept the nerve. Care was taken to be sure that there was no undue tension along the nerve either proximally or distally. The nerve was carefully retracted while several #0 Vicryl interrupted sutures were placed to reapproximate the flap to the medial epicondylar soft tissues, thereby creating a "sling" for the ulnar nerve. The cubital tunnel itself was reapproximated using several #0 Vicryl interrupted sutures in order to prevent the nerve from falling back into the cubital tunnel.   The wound was copiously irrigated with sterile saline solution before the subcutaneous tissues were closed using 2-0 Vicryl interrupted sutures. The skin was closed using staples. A total of 15 cc of 0.5% plain Sensorcaine was injected in and around the incision to help with postoperative analgesia. A sterile bulky dressing was applied to the arm before the patient was placed into a sling. The patient was then awakened, extubated, and returned to the recovery room in satisfactory condition after tolerating the procedure well.

## 2016-08-06 NOTE — Transfer of Care (Signed)
Immediate Anesthesia Transfer of Care Note  Patient: [name]  Procedure(s) Performed: Procedure(s): ULNAR NERVE DECOMPRESSION/TRANSPOSITION (Right)  Patient Location: PACU  Anesthesia Type:General  Level of Consciousness: awake  Airway & Oxygen Therapy: Patient connected to face mask oxygen  Post-op Assessment: Post -op Vital signs reviewed and stable  Post vital signs: stable  Last Vitals:  Vitals:   08/06/16 1110 08/06/16 1400  BP: (!) 161/78 132/69  Pulse: (!) 56 60  Resp: 16 16  Temp: 36.8 C 36.3 C    Last Pain:  Vitals:   08/06/16 1400  TempSrc: Temporal  PainSc:          Complications: No apparent anesthesia complications

## 2016-08-06 NOTE — Anesthesia Procedure Notes (Signed)
Procedure Name: Intubation Date/Time: 08/06/2016 12:41 PM Performed by: Nelda Marseille Pre-anesthesia Checklist: Patient identified, Patient being monitored, Timeout performed, Emergency Drugs available and Suction available Patient Re-evaluated:Patient Re-evaluated prior to inductionOxygen Delivery Method: Circle system utilized Preoxygenation: Pre-oxygenation with 100% oxygen Intubation Type: IV induction Ventilation: Mask ventilation without difficulty Laryngoscope Size: Mac, 3 and McGraph Grade View: Grade III Tube type: Oral Tube size: 7.0 mm Number of attempts: 1 Airway Equipment and Method: Stylet Placement Confirmation: ETT inserted through vocal cords under direct vision,  positive ETCO2 and breath sounds checked- equal and bilateral Secured at: 21 cm Tube secured with: Tape Dental Injury: Teeth and Oropharynx as per pre-operative assessment

## 2016-08-06 NOTE — Anesthesia Preprocedure Evaluation (Signed)
Anesthesia Evaluation  Patient identified by MRN, date of birth, ID band Patient awake    Reviewed: Allergy & Precautions, NPO status , Patient's Chart, lab work & pertinent test results, reviewed documented beta blocker date and time   Airway Mallampati: III  TM Distance: >3 FB     Dental  (+) Chipped   Pulmonary asthma , sleep apnea and Continuous Positive Airway Pressure Ventilation ,           Cardiovascular hypertension, Pt. on home beta blockers + Peripheral Vascular Disease and + DOE  + dysrhythmias Atrial Fibrillation + Valvular Problems/Murmurs      Neuro/Psych PSYCHIATRIC DISORDERS Depression  Neuromuscular disease    GI/Hepatic GERD  ,  Endo/Other  diabetes, Type 2  Renal/GU Renal disease     Musculoskeletal  (+) Arthritis ,   Abdominal   Peds  Hematology   Anesthesia Other Findings Gastric sleeve. Takes narcs and valium.  Reproductive/Obstetrics                             Anesthesia Physical Anesthesia Plan  ASA: III  Anesthesia Plan: General   Post-op Pain Management:    Induction: Intravenous  Airway Management Planned: Oral ETT and LMA  Additional Equipment:   Intra-op Plan:   Post-operative Plan:   Informed Consent: I have reviewed the patients History and Physical, chart, labs and discussed the procedure including the risks, benefits and alternatives for the proposed anesthesia with the patient or authorized representative who has indicated his/her understanding and acceptance.     Plan Discussed with: CRNA  Anesthesia Plan Comments:         Anesthesia Quick Evaluation

## 2016-08-06 NOTE — Discharge Instructions (Addendum)
Keep dressing dry and intact. Keep arm elevated above heart level.  Use sling for two weeks, then as needed for comfort. May shower after dressing removed on postop day 4 (Monday). Cover staples with Band-Aids or ace wrap after drying off. Apply ice to affected area frequently. Take pain medication as prescribed when needed.  Return for follow-up in 10-14 days or as scheduled.  AMBULATORY SURGERY  DISCHARGE INSTRUCTIONS   1) The drugs that you were given will stay in your system until tomorrow so for the next 24 hours you should not:  A) Drive an automobile B) Make any legal decisions C) Drink any alcoholic beverage   2) You may resume regular meals tomorrow.  Today it is better to start with liquids and gradually work up to solid foods.  You may eat anything you prefer, but it is better to start with liquids, then soup and crackers, and gradually work up to solid foods.   3) Please notify your doctor immediately if you have any unusual bleeding, trouble breathing, redness and pain at the surgery site, drainage, fever, or pain not relieved by medication.    4) Additional Instructions:        Please contact your physician with any problems or Same Day Surgery at [phone], Monday through Friday 6 am to 4 pm, or [place] at [facility] number at [phone].

## 2016-08-06 NOTE — H&P (Signed)
Paper H&P to be scanned into permanent record. H&P reviewed and patient re-examined. No changes. 

## 2016-08-06 NOTE — H&P (Signed)
Patient Demographics  [name], is a 65 y.o. female   MRN: [id number]   DOB - 1950-07-29  Admit Date - 08/06/2016    Outpatient Primary MD for the patient is [name], DO  Consult requested in the Hospital by Poggi, Marshall Cork, MD, On 08/06/2016    Reason for consult ; hypoxia     chief complaint ; persistent hypoxia after surgery for ulnar nerve transposition today.   History of present illness: 66 year old female patient with history of multiple medical problems of hypothyroidism, GERD, diabetes mellitus type 2, history of morbid obesity Zestril post gastric sleeve, history of sleep apnea on CPAP at night had right cubital tunnel syndrome and did have anterior transposition of ulnarnerve by orthopedic today. Patient  received general anesthesia. After surgery patient remained persistently hypoxic since 1 PM today. O2 sat dropped to 84% on ambulation, stat medical consult requested for persistent hypoxia. She had told me that previously she developed hypoxia during previous surgery and then it took about 1-2 weeks for her to improve oxygenation. I recommend her to stay overnight and have social worker to arrange home oxygen on 2 L tomorrow morning. And patient is agreeable for that. I spoke with Dr. Roland Rack. He is going to admit the patient. Past Medical History:  Diagnosis Date  . Anxiety and depression   . Arthritis   . Asthma   . Cystocele   . Depression   . Diabetes ([place])   . DVT (deep venous thrombosis) ([facility])    Righ calf  . GERD (gastroesophageal reflux disease)   . Heart murmur    Echocardiogram June 2015: Mild MR (possible vegetation seen on TTE, not seen on TEE), normal LV size with moderate concentric LVH. Normal function EF 60-65%. Normal diastolic function. Mild LA dilation.  Marland Kitchen History of IBS   . History  of methicillin resistant staphylococcus aureus (MRSA) 2008  . Hypothyroidism   . Insomnia   . Kidney stone    kidney stones with lithotripsy  . Neuropathy involving both lower extremities   . Obesity, Class II, BMI 35-39.9, with comorbidity   . Paroxysmal atrial fibrillation ([place]) 2007   In June 2015, Cardiac Event Monitor: Mostly SR/sinus arrhythmia with PVCs that are frequent. Short bursts of A. fib lasting several minutes;; CHA2DS2-VASc Score = 5 (age, Female, PVD, DM, HTN)  . Peripheral vascular disease ([place])   . Sleep apnea    use C-PAP  . Venous stasis dermatitis of both lower extremities       Past Surgical History:  Procedure Laterality Date  . ANKLE SURGERY Right   . APPLICATION OF WOUND VAC Left 06/27/2015   Procedure: APPLICATION OF WOUND VAC ( POSSIBLE ) ;  Surgeon: [name], MD;  Location: [facility] ORS;  Service: Vascular;  Laterality: Left;  . CHOLECYSTECTOMY    . HERNIA REPAIR     umbilical  . HIATAL HERNIA REPAIR    . I&D EXTREMITY Left 06/27/2015   Procedure: IRRIGATION AND DEBRIDEMENT EXTREMITY            (  CALF HEMATOMA ) POSSIBLE WOUND VAC;  Surgeon: [name], MD;  Location: [facility] ORS;  Service: Vascular;  Laterality: Left;  . JOINT REPLACEMENT     left knee replacement  . KIDNEY SURGERY Right    kidney stones  . LITHOTRIPSY    . NM [facility] HX)  February 2017   Likely breast attenuation. LOW RISK study. Normal EF 55-60%.  . removal of left hematoma Left    leg  . REVERSE SHOULDER ARTHROPLASTY Right 10/08/2015   Procedure: REVERSE SHOULDER ARTHROPLASTY;  Surgeon: [name], MD;  Location: [facility] ORS;  Service: Orthopedics;  Laterality: Right;  . SLEEVE GASTROPLASTY    . TONSILLECTOMY    . TOTAL KNEE ARTHROPLASTY    . TRANSESOPHAGEAL ECHOCARDIOGRAM  08/08/2013   Mild LVH, EF 60-65%. Moderate LA dilation and mild RA dilation. Mild MR with no evidence of stenosis and no evidence of endocarditis. A false chordae is noted.  . TRANSTHORACIC  ECHOCARDIOGRAM  08/03/2013   Mild-Moderate concentric LVH, EF 60-65%. Normal diastolic function. Mild LA dilation. Mild MR with possible vegetation  - not confirmed on TEE   . VAGINAL HYSTERECTOMY          Review of Systems    In addition to the HPI above, No Fever-chills, No Headache, No changes with Vision or hearing, No problems swallowing food or Liquids, No Chest pain, Cough or Shortness of Breath, No Abdominal pain, No Nausea or Vommitting, Bowel movements are regular, No Blood in stool or Urine, No dysuria, No new skin rashes or bruises, No new joints pains-aches,  No new weakness, tingling, numbness in any extremity, No recent weight gain or loss, No polyuria, polydypsia or polyphagia, No significant Mental Stressors.  A full 10 point Review of Systems was done, except as stated above, all other Review of Systems were negative.   Social History Social History  Substance Use Topics  . Smoking status: Never Smoker  . Smokeless tobacco: Never Used  . Alcohol use No     Family History Family History  Problem Relation Age of Onset  . Skin cancer Father   . Diabetes Father   . Hypertension Father   . Peripheral vascular disease Father   . Cancer Father   . Cerebral aneurysm Father   . Alcohol abuse Father   . Varicose Veins Mother   . Kidney disease Mother   . Arthritis Mother   . Mental illness Sister   . Cancer Maternal Aunt        breast  . Breast cancer Maternal Aunt   . Arthritis Maternal Grandmother   . Hypertension Maternal Grandmother   . Diabetes Maternal Grandmother   . Arthritis Maternal Grandfather   . Heart disease Maternal Grandfather   . Hypertension Maternal Grandfather   . Arthritis Paternal Grandmother   . Hypertension Paternal Grandmother   . Diabetes Paternal Grandmother   . Arthritis Paternal Grandfather   . Hypertension Paternal Grandfather      Prior to Admission medications   Medication Sig Start Date End Date Taking?  Authorizing Provider  acetaminophen (TYLENOL) 500 MG tablet Take 1,000 mg by mouth every 8 (eight) hours as needed for mild pain.   Yes [provider]  albuterol (PROVENTIL HFA;VENTOLIN HFA) 108 (90 Base) MCG/ACT inhaler Inhale 1-2 puffs into the lungs every 4 (four) hours as needed for wheezing or shortness of breath. Patient taking differently: Inhale 2 puffs into the lungs every 4 (four) hours as needed for wheezing  or shortness of breath.  03/29/15  Yes Bobetta Lime, MD  albuterol (PROVENTIL) (2.5 MG/3ML) 0.083% nebulizer solution Take 3 mLs (2.5 mg total) by nebulization every 6 (six) hours as needed for wheezing or shortness of breath. 02/21/15  Yes Ashok Norris, MD  ALPRAZolam Duanne Moron) 0.5 MG tablet TAKE ONE TABLET TWICE DAILY AS NEEDED 07/24/16  Yes Cook, Jayce G, DO  buPROPion (WELLBUTRIN XL) 300 MG 24 hr tablet TAKE ONE TABLET BY MOUTH EVERY DAY 07/22/16  Yes Cook, Jayce G, DO  dabigatran (PRADAXA) 150 MG CAPS capsule TAKE 1 CAPSULE BY MOUTH TWICE DAILY 04/24/16  Yes Cook, Jayce G, DO  Dexlansoprazole 30 MG capsule Take 1 capsule (30 mg total) by mouth daily. 06/30/16  Yes Cook, Jayce G, DO  DULoxetine (CYMBALTA) 60 MG capsule Take 1 capsule (60 mg total) by mouth daily. Patient taking differently: Take 60 mg by mouth every evening.  06/15/16 09/13/16 Yes Milinda Pointer, MD  ESTRACE VAGINAL 0.1 MG/GM vaginal cream INSERT 1 GRAM VAGINALLY TWICE A WEEK 09/18/15  Yes Cook, Jayce G, DO  ferrous sulfate 325 (65 FE) MG tablet Take 1 tablet (325 mg total) by mouth 2 (two) times daily. 07/03/16  Yes Cook, Jayce G, DO  fluticasone-salmeterol (ADVAIR HFA) 341-96 MCG/ACT inhaler Inhale 2 puffs into the lungs 2 (two) times daily. 10/13/15  Yes Gouru, Illene Silver, MD  gabapentin (NEURONTIN) 800 MG tablet Take 1 tablet (800 mg total) by mouth 4 (four) times daily. 06/15/16 09/13/16 Yes Milinda Pointer, MD  HYDROcodone-acetaminophen (NORCO/VICODIN) 5-325 MG tablet Take 1 tablet by mouth every 8 (eight)  hours as needed for severe pain. 08/14/16 09/13/16 Yes Milinda Pointer, MD  HYDROcodone-acetaminophen (NORCO/VICODIN) 5-325 MG tablet Take 1 tablet by mouth every 8 (eight) hours as needed for severe pain. 07/15/16 08/14/16 Yes Milinda Pointer, MD  hydrocortisone cream 1 % Apply 1 application topically daily as needed for itching.   Yes [provider]  ipratropium (ATROVENT) 0.06 % nasal spray Place 2 sprays into both nostrils 4 (four) times daily. Patient taking differently: Place 2 sprays into both nostrils 2 (two) times daily as needed for rhinitis.  06/09/16  Yes Cook, Jayce G, DO  levocetirizine (XYZAL) 5 MG tablet Take 1 tablet (5 mg total) by mouth every evening. 07/07/16  Yes Cook, Jayce G, DO  levothyroxine (SYNTHROID, LEVOTHROID) 150 MCG tablet TAKE ONE TABLET BY MOUTH EVERY MORNING 07/22/16  Yes Cook, Jayce G, DO  meloxicam (MOBIC) 15 MG tablet Take 15 mg by mouth daily.  08/22/15  Yes [provider]  metFORMIN (GLUCOPHAGE) 1000 MG tablet TAKE ONE TABLET BY MOUTH TWICE DAILY Patient taking differently: TAKE ONE TABLET IN THE MORNING AND HALF TABLET AT NIGHT 05/21/16  Yes Crecencio Mc, MD  metoprolol tartrate (LOPRESSOR) 25 MG tablet TAKE ONE TABLET TWICE DAILY 07/01/16  Yes Cook, Jayce G, DO  mirabegron ER (MYRBETRIQ) 25 MG TB24 tablet Take 1 tablet (25 mg total) by mouth daily. 12/05/15  Yes McGowan, [name] A, PA-C  montelukast (SINGULAIR) 10 MG tablet TAKE ONE TABLET BY MOUTH EVERY DAY 04/24/16  Yes Cook, Jayce G, DO  oxybutynin (DITROPAN-XL) 10 MG 24 hr tablet Take 1 tablet (10 mg total) by mouth daily. Patient taking differently: Take 10 mg by mouth at bedtime.  12/05/15  Yes McGowan, [name] A, PA-C  quinapril (ACCUPRIL) 20 MG tablet TAKE ONE TABLET EVERY DAY 05/26/16  Yes Cook, Jayce G, DO  fluconazole (DIFLUCAN) 100 MG tablet Take 1 tablet (100 mg total) by mouth daily.  08/06/16   Poggi, Marshall Cork, MD  HYDROcodone-acetaminophen (NORCO/VICODIN) 5-325 MG tablet Take 1 tablet  by mouth every 8 (eight) hours as needed for severe pain. 06/15/16 07/15/16  Milinda Pointer, MD  Melatonin 10 MG CAPS Take 20 mg by mouth at bedtime as needed. Patient not taking: Reported on 07/23/2016 06/15/16 09/13/16  Milinda Pointer, MD  oxyCODONE (ROXICODONE) 5 MG immediate release tablet Take 1-2 tablets (5-10 mg total) by mouth every 4 (four) hours as needed for severe pain. 08/06/16   Poggi, Marshall Cork, MD    Anti-infectives    Start     Dose/Rate Route Frequency Ordered Stop   08/06/16 1048  clindamycin (CLEOCIN) 900 MG/50ML IVPB    Comments:  LEWIS, CINDY: cabinet override      08/06/16 1048 08/06/16 1233   08/06/16 0000  fluconazole (DIFLUCAN) 100 MG tablet     100 mg Oral Daily 08/06/16 1143     08/05/16 2215  clindamycin (CLEOCIN) IVPB 900 mg     900 mg 100 mL/hr over 30 Minutes Intravenous  Once 08/05/16 2204 08/06/16 1303      Scheduled Meds: . ipratropium-albuterol  3 mL Nebulization Q4H   Continuous Infusions: . sodium chloride 50 mL/hr at 08/06/16 1144  . 0.9 % NaCl with KCl 20 mEq / L     PRN Meds:.fentaNYL (SUBLIMAZE) injection, metoCLOPramide **OR** metoCLOPramide (REGLAN) injection, ondansetron (ZOFRAN) IV, ondansetron **OR** ondansetron (ZOFRAN) IV, oxyCODONE  Allergies  Allergen Reactions  . Penicillins Hives, Shortness Of Breath and Swelling    Facial swelling Has patient had a PCN reaction causing immediate rash, facial/tongue/throat swelling, SOB or lightheadedness with hypotension: Yes Has patient had a PCN reaction causing severe rash involving mucus membranes or skin necrosis: Yes Has patient had a PCN reaction that required hospitalization No Has patient had a PCN reaction occurring within the last 10 years: No If all of the above answers are "NO", then may proceed with Cephalosporin use.   . Aspirin Hives  . Terramycin [Oxytetracycline] Hives    Physical Exam  Vitals  Blood pressure 124/70, pulse 62, temperature 97.4 F (36.3 C), resp. rate  18, height 5\' 5"  (1.651 m), weight 114.3 kg (252 lb), SpO2 94 %.   1. General ;alert,awake, oriented. 2. Normal affect and insight, Not Suicidal or Homicidal, Awake Alert, Oriented X 3.  3. No F.N deficits, ALL C.Nerves Intact, Strength 5/5 all 4 extremities, Sensation intact all 4 extremities, Plantars down going.  4. Ears and Eyes appear Normal, Conjunctivae clear, PERRLA. Moist Oral Mucosa.  5. Supple Neck, No JVD, No cervical lymphadenopathy appriciated, No Carotid Bruits.  6. Symmetrical Chest wall movement, Good air movement bilaterally, CTAB.  7. RRR, No Gallops, Rubs or Murmurs, No Parasternal Heave.  8. Positive Bowel Sounds, Abdomen Soft, No tenderness, No organomegaly appriciated,No rebound -guarding or rigidity.  9.  No Cyanosis, Normal Skin Turgor, No Skin Rash or Bruise.  10. Good muscle tone,  joints appear normal , no effusions, Normal ROM.  11. No Palpable Lymph Nodes in Neck or Axillae    Data Review  CBC No results for input(s): WBC, HGB, HCT, PLT, MCV, MCH, MCHC, RDW, LYMPHSABS, MONOABS, EOSABS, BASOSABS, BANDABS in the last 168 hours.  Invalid input(s): NEUTRABS, BANDSABD ------------------------------------------------------------------------------------------------------------------  Chemistries  No results for input(s): NA, K, CL, CO2, GLUCOSE, BUN, CREATININE, CALCIUM, MG, AST, ALT, ALKPHOS, BILITOT in the last 168 hours.  Invalid input(s): GFRCGP ------------------------------------------------------------------------------------------------------------------ CrCl cannot be calculated (Patient's most recent lab result is older than the  maximum 21 days allowed.). ------------------------------------------------------------------------------------------------------------------ No results for input(s): TSH, T4TOTAL, T3FREE, THYROIDAB in the last 72 hours.  Invalid input(s): FREET3   Coagulation profile No results for input(s): INR, PROTIME in the  last 168 hours. ------------------------------------------------------------------------------------------------------------------- No results for input(s): DDIMER in the last 72 hours. -------------------------------------------------------------------------------------------------------------------  Cardiac Enzymes No results for input(s): CKMB, TROPONINI, MYOGLOBIN in the last 168 hours.  Invalid input(s): CK ------------------------------------------------------------------------------------------------------------------ Invalid input(s): POCBNP   ---------------------------------------------------------------------------------------------------------------  Urinalysis    Component Value Date/Time   COLORURINE STRAW (A) 10/27/2015 1250   APPEARANCEUR CLEAR (A) 10/27/2015 1250   APPEARANCEUR Clear 11/09/2014 1100   LABSPEC 1.010 10/27/2015 1250   PHURINE 5.0 10/27/2015 1250   GLUCOSEU NEGATIVE 10/27/2015 1250   HGBUR NEGATIVE 10/27/2015 1250   BILIRUBINUR NEGATIVE 10/27/2015 1250   BILIRUBINUR Negative 11/09/2014 1100   KETONESUR NEGATIVE 10/27/2015 1250   PROTEINUR NEGATIVE 10/27/2015 1250   UROBILINOGEN 0.2 09/21/2014 1613   NITRITE NEGATIVE 10/27/2015 1250   LEUKOCYTESUR TRACE (A) 10/27/2015 1250   LEUKOCYTESUR Negative 11/09/2014 1100     Imaging results:   X-ray Chest Pa Or Ap  Result Date: 08/06/2016 CLINICAL DATA:  Difficulty breathing after anesthesia today. EXAM: CHEST 1 VIEW COMPARISON:  06/09/2016 FINDINGS: The cardiomediastinal silhouette is unchanged, with the heart being upper limits of normal in size. There is moderate chronic elevation of the right hemidiaphragm. No airspace consolidation, edema, pleural effusion, or pneumothorax is identified. Prior right shoulder arthroplasty and thoracic spondylosis are noted. IMPRESSION: No active disease. Electronically Signed   By: [name] M.D.   On: 08/06/2016 17:05       Assessment & Plan    1.  Persistent hypoxia persistent hypoxia after surgery for right cubital tunnel syndrome with anterior ulnar nerve transposition. IK due to combination of anesthesia and also morbid obesity. I recommend her to stay overnight and have case manager to arrange for home oxygen because O2 sat dropped  To below  88% on ambulation; continue incentive spirometry.  #2 obstructive sleep apnea: Continue CPAP at night.  #3 history of asthma: Continue nebulizers while in the hospital,  #4 history of depression: Continue Wellbutrin, Cymbalta #5. History of hypothyroidism: Continue Synthroid. #6 overactive bladder: Continue methotrexate. History of DVT: Patient is on Pradaxa: Continue that.    DVT Prophylaxis Heparin -   AM Labs Ordered, also please review Full Orders  Family Communication: Plan discussed with patient and and Dr.Poggi/   Thank you for the consult, we will follow the patient with you in the Hospital.   [facility] M.D on 08/06/2016 at 6:22 PM  Note: This dictation was prepared with Dragon dictation along with smaller phrase technology. Any transcriptional errors that result from this process are unintentional.

## 2016-08-07 ENCOUNTER — Encounter: Payer: Self-pay | Admitting: Surgery

## 2016-08-07 DIAGNOSIS — E119 Type 2 diabetes mellitus without complications: Secondary | ICD-10-CM | POA: Diagnosis not present

## 2016-08-07 DIAGNOSIS — I10 Essential (primary) hypertension: Secondary | ICD-10-CM | POA: Diagnosis not present

## 2016-08-07 DIAGNOSIS — E039 Hypothyroidism, unspecified: Secondary | ICD-10-CM | POA: Diagnosis not present

## 2016-08-07 DIAGNOSIS — J45909 Unspecified asthma, uncomplicated: Secondary | ICD-10-CM | POA: Diagnosis not present

## 2016-08-07 DIAGNOSIS — E069 Thyroiditis, unspecified: Secondary | ICD-10-CM | POA: Diagnosis not present

## 2016-08-07 DIAGNOSIS — G4730 Sleep apnea, unspecified: Secondary | ICD-10-CM | POA: Diagnosis not present

## 2016-08-07 DIAGNOSIS — R0602 Shortness of breath: Secondary | ICD-10-CM | POA: Diagnosis not present

## 2016-08-07 DIAGNOSIS — Z96619 Presence of unspecified artificial shoulder joint: Secondary | ICD-10-CM | POA: Diagnosis not present

## 2016-08-07 DIAGNOSIS — G5621 Lesion of ulnar nerve, right upper limb: Secondary | ICD-10-CM | POA: Insufficient documentation

## 2016-08-07 DIAGNOSIS — J449 Chronic obstructive pulmonary disease, unspecified: Secondary | ICD-10-CM | POA: Diagnosis not present

## 2016-08-07 DIAGNOSIS — R0600 Dyspnea, unspecified: Secondary | ICD-10-CM | POA: Diagnosis not present

## 2016-08-07 DIAGNOSIS — M129 Arthropathy, unspecified: Secondary | ICD-10-CM | POA: Diagnosis not present

## 2016-08-07 DIAGNOSIS — R0902 Hypoxemia: Secondary | ICD-10-CM | POA: Diagnosis not present

## 2016-08-07 NOTE — Progress Notes (Signed)
SATURATION QUALIFICATIONS: (This note is used to comply with regulatory documentation for home oxygen)  Patient Saturations on Room Air at Rest = 90%  Patient Saturations on Room Air while Ambulating = 85%  Patient Saturations on 2 Liters of oxygen while Ambulating = 93%  Please briefly explain why patient needs home oxygen:  Patient unable to ambulate without desating to 85% while on room air.   Deri Fuelling, RN

## 2016-08-07 NOTE — Progress Notes (Signed)
New Admit  Arrival Method: From PACU Mental Orientation: A&OX4 Telemetry:  NO Assessment: Lungs clear. S1 S2 regular. 2 L O2 acute. SOB with exertion.  Skin: Abrasion to R great toe and L ankle. L ankle redness. Iv: 20 G L arm Pain: assessed, medications administered. Safety Measures: Bed low. Bed alarm on. Fall risk signage. Call bell in reach. Bedside table within reach. Not impulsive.  Admission: Complete 1A Orientation: Complete Family: Updated  Pt refused bowel medications related to diarrhea X2 weeks. Educated. VSS. Will monitor.

## 2016-08-07 NOTE — Evaluation (Signed)
Physical Therapy Evaluation Patient Details Name: [name] MRN: [id number] DOB: Apr 23, 1950 Today's Date: 08/07/2016   History of Present Illness  Pt is a 66 y/o F who presented with R cubital tunnel syndrome and is s/p anterior transposition of ulnar nerve on 08/06/16.  After surgery pt remained hypoxic.  Pt's PMH includes DVT, obesity, neuropathy, ankle surgery, L TKA, R rTSA.    Clinical Impression  Patient is s/p above surgery resulting in functional limitations due to the deficits listed below (see PT Problem List). [name] was Ind PTA.  She currently requires min assist to exit bed on the R side and min assist for stair training today. SpO2 94% initially ambulating on RA, drops to 85% on RA at end of stair training, remains 93% or higher after bumped up to 2L for remainder of ambulation.  Patient will benefit from skilled PT to increase their independence and safety with mobility to allow discharge to the venue listed below.      Follow Up Recommendations Outpatient PT (although pt declines due to financial reasons)    Equipment Recommendations  None recommended by PT    Recommendations for Other Services       Precautions / Restrictions Precautions Precautions: Other (comment);Fall Precaution Comments: Monitor O2, impulsive Required Braces or Orthoses: Sling (no specific orders on when to wear sling) Restrictions Weight Bearing Restrictions: Yes RUE Weight Bearing: Non weight bearing Other Position/Activity Restrictions: Pt is to not fully extend R elbow.  She is allowed to lift objects up to the weight of a book.      Mobility  Bed Mobility Overal bed mobility: Needs Assistance Bed Mobility: Supine to Sit     Supine to sit: Min assist;HOB elevated     General bed mobility comments: Min assist to elevate trunk, pt exiting bed on her R as she is moving quickly to get to [facility].    Transfers Overall transfer level: Needs assistance Equipment used:  None Transfers: Sit to/from Stand Sit to Stand: Supervision         General transfer comment: Supervision for safety as pt moves quickly to standing to transfer to [name].  After ambulating, provided supervision for safety to sit.  No cues required as pt uses LUE to reach back for armrest and demonstrates safe technique.  Ambulation/Gait Ambulation/Gait assistance: Supervision Ambulation Distance (Feet): 250 Feet Assistive device: None Gait Pattern/deviations: WFL(Within Functional Limits)   Gait velocity interpretation: at or above normal speed for age/gender General Gait Details: As exiting room she requires cues to be mindful of position of RUE to avoid bumping elbow into doorframe.  No unsteadiness noticed when ambulating, only mild instability noticed with higher level balance activities when ambulating.  SpO2 94% initially ambulating on RA, drops to 85% on RA at end of stair training, remains 93% or higher after bumped up to 2L for remainder of ambulation.  Stairs Stairs: Yes Stairs assistance: Min assist Stair Management: One rail Right;Forwards;Step to pattern Number of Stairs: 3 General stair comments: Min assist [place] as pt unable to utilize R railing during ascent.  Pt uses railing on descent with min guard for safety.    Wheelchair Mobility    Modified Rankin (Stroke Patients Only)       Balance Overall balance assessment: Needs assistance;History of Falls Sitting-balance support: No upper extremity supported;Feet supported Sitting balance-Leahy Scale: Good     Standing balance support: No upper extremity supported;During functional activity Standing balance-Leahy Scale: Good  Standardized Balance Assessment Standardized Balance Assessment : Dynamic Gait Index   Dynamic Gait Index Level Surface: Normal Change in Gait Speed: Normal Gait with Horizontal Head Turns: Mild Impairment Gait with Vertical Head Turns: Normal Gait and Pivot  Turn: Normal Step Over Obstacle: Normal Step Around Obstacles: Normal Steps: Moderate Impairment Total Score: 21       Pertinent Vitals/Pain Pain Assessment: No/denies pain    Home Living Family/patient expects to be discharged to:: Private residence Living Arrangements: Spouse/significant other Available Help at Discharge: Family;Available 24 hours/day (husband, although limited physically (back pain)) Type of Home: House Home Access: Stairs to enter Entrance Stairs-Rails: Right Entrance Stairs-Number of Steps: 3 Home Layout: One level Home Equipment: Walker - 2 wheels;Cane - single point;Bedside commode;Shower seat Additional Comments: Pt additionally has 5 steps in the back with Bil rails but she has to walk over grassy area to get to these steps    Prior Function Level of Independence: Independent         Comments: Pt reports she was not using AD PTA.  She was ind with bathing, dressing, cooking, cleaning, driving.   2 falls in the past 6 months, one when playing cornhole with her grandson     Hand Dominance        Extremity/Trunk Assessment   Upper Extremity Assessment Upper Extremity Assessment: RUE deficits/detail RUE Deficits / Details: In sling, limited assessment.  Able to move fingers, flex and extend elbow angainst gravity, flex shoulder against gravity RUE: Unable to fully assess due to immobilization    Lower Extremity Assessment Lower Extremity Assessment: Overall WFL for tasks assessed       Communication   Communication: No difficulties  Cognition Arousal/Alertness: Awake/alert Behavior During Therapy: WFL for tasks assessed/performed;Impulsive Overall Cognitive Status: Within Functional Limits for tasks assessed                                        General Comments      Exercises     Assessment/Plan    PT Assessment Patient needs continued PT services  PT Problem List Decreased strength;Decreased range of  motion;Decreased balance;Cardiopulmonary status limiting activity;Decreased knowledge of precautions;Decreased safety awareness       PT Treatment Interventions DME instruction;Gait training;Stair training;Functional mobility training;Therapeutic activities;Therapeutic exercise;Balance training;Neuromuscular re-education;Patient/family education;Manual techniques    PT Goals (Current goals can be found in the Care Plan section)  Acute Rehab PT Goals Patient Stated Goal: to go home and return to gym as soon as possible PT Goal Formulation: With patient Time For Goal Achievement: 08/21/16 Potential to Achieve Goals: Good    Frequency Min 2X/week   Barriers to discharge        Co-evaluation               AM-PAC PT "6 Clicks" Daily Activity  Outcome Measure Difficulty turning over in bed (including adjusting bedclothes, sheets and blankets)?: Total Difficulty moving from lying on back to sitting on the side of the bed? : Total Difficulty sitting down on and standing up from a chair with arms (e.g., wheelchair, bedside commode, etc,.)?: A Little Help needed moving to and from a bed to chair (including a wheelchair)?: A Little Help needed walking in hospital room?: A Little Help needed climbing 3-5 steps with a railing? : A Little 6 Click Score: 14    End of Session Equipment Utilized During Treatment: Gait belt;Oxygen  Activity Tolerance: Patient tolerated treatment well Patient left: in chair;with call bell/phone within reach;with chair alarm set;Other (comment) (with CM in room) Nurse Communication: Mobility status;Other (comment) (SpO2) PT Visit Diagnosis: Unsteadiness on feet (R26.81);History of falling (Z91.81)    Time: 9373-4287 PT Time Calculation (min) (ACUTE ONLY): 29 min   Charges:   PT Evaluation $PT Eval Low Complexity: 1 Procedure PT Treatments $Gait Training: 8-22 mins   PT G Codes:   PT G-Codes **NOT FOR INPATIENT CLASS** Functional Assessment Tool  Used: Clinical judgement;AM-PAC 6 Clicks Basic Mobility;Dynamic Gait Index Functional Limitation: Mobility: Walking and moving around Mobility: Walking and Moving Around Current Status [phone]): At least 40 percent but less than 60 percent impaired, limited or restricted Mobility: Walking and Moving Around Goal Status [phone]): At least 1 percent but less than 20 percent impaired, limited or restricted    Collie Siad PT, DPT 08/07/2016, 9:55 AM

## 2016-08-07 NOTE — Progress Notes (Signed)
Pt O2 sats are 100 % rm air at rest. Drop to 84% when getting up to bedside commode.

## 2016-08-07 NOTE — Progress Notes (Signed)
[name] to be D/C'd Home per MD order.  Discussed with the patient and all questions fully answered.  VSS, Skin clean, dry and intact without evidence of skin break down, no evidence of skin tears noted. IV catheter discontinued intact. Site without signs and symptoms of complications. Dressing and pressure applied.  An After Visit Summary was printed and given to the patient. Patient received prescription.  D/c education completed with patient/family including follow up instructions, medication list, d/c activities limitations if indicated, with other d/c instructions as indicated by MD - patient able to verbalize understanding, all questions fully answered.   Patient instructed to return to ED, call 911, or call MD for any changes in condition.   Patient escorted via [place], and D/C home via private auto.  [name] 08/07/2016 2:05 PM

## 2016-08-07 NOTE — Progress Notes (Addendum)
[place] [facility] [place] NAME: [name]    MR#:  [id number]  DATE OF BIRTH:  1950/06/25  SUBJECTIVE:  Patient was admitted after surgery when she was found to be hypoxic. She feels much better now sats 99 on 2 L nasal Oxygen.  REVIEW OF SYSTEMS:   Review of Systems  Constitutional: Negative for chills, fever and weight loss.  HENT: Negative for ear discharge, ear pain and nosebleeds.   Eyes: Negative for blurred vision, pain and discharge.  Respiratory: Negative for sputum production, shortness of breath, wheezing and stridor.   Cardiovascular: Negative for chest pain, palpitations, orthopnea and PND.  Gastrointestinal: Negative for abdominal pain, diarrhea, nausea and vomiting.  Genitourinary: Negative for frequency and urgency.  Musculoskeletal: Negative for back pain and joint pain.  Neurological: Negative for sensory change, speech change, focal weakness and weakness.  Psychiatric/Behavioral: Negative for depression and hallucinations. The patient is not nervous/anxious.    Tolerating Diet:yes Tolerating PT: not needed  DRUG ALLERGIES:   Allergies  Allergen Reactions  . Penicillins Hives, Shortness Of Breath and Swelling    Facial swelling Has patient had a PCN reaction causing immediate rash, facial/tongue/throat swelling, SOB or lightheadedness with hypotension: Yes Has patient had a PCN reaction causing severe rash involving mucus membranes or skin necrosis: Yes Has patient had a PCN reaction that required hospitalization No Has patient had a PCN reaction occurring within the last 10 years: No If all of the above answers are "NO", then may proceed with Cephalosporin use.   . Aspirin Hives  . Terramycin [Oxytetracycline] Hives    VITALS:  Blood pressure (!) 157/69, pulse 63, temperature 98 F (36.7 C), temperature source Oral, resp. rate 16, height 5\' 5"  (1.651 m), weight 116.9 kg (257 lb 12.8 oz), SpO2 100  %.  PHYSICAL EXAMINATION:   Physical Exam  GENERAL:  66 y.o.-year-old patient lying in the bed with no acute distress. obese EYES: Pupils equal, round, reactive to light and accommodation. No scleral icterus. Extraocular muscles intact.  HEENT: Head atraumatic, normocephalic. Oropharynx and nasopharynx clear.  NECK:  Supple, no jugular venous distention. No thyroid enlargement, no tenderness.  LUNGS: Normal breath sounds bilaterally, no wheezing, rales, rhonchi. No use of accessory muscles of respiration.  CARDIOVASCULAR: S1, S2 normal. No murmurs, rubs, or gallops.  ABDOMEN: Soft, nontender, nondistended. Bowel sounds present. No organomegaly or mass.  EXTREMITIES: No cyanosis, clubbing or edema b/l.   Right arm dressing+ surgical NEUROLOGIC: Cranial nerves II through XII are intact. No focal Motor or sensory deficits b/l.   PSYCHIATRIC:  patient is alert and oriented x 3.  SKIN: No obvious rash, lesion, or ulcer.   LABORATORY PANEL:  CBC  Recent Labs Lab 08/06/16 2054  WBC 4.8  HGB 11.4*  HCT 36.1  PLT 200    Chemistries   Recent Labs Lab 08/06/16 2054  CREATININE 0.89   Cardiac Enzymes No results for input(s): TROPONINI in the last 168 hours. RADIOLOGY:  X-ray Chest Pa Or Ap  Result Date: 08/06/2016 CLINICAL DATA:  Difficulty breathing after anesthesia today. EXAM: CHEST 1 VIEW COMPARISON:  06/09/2016 FINDINGS: The cardiomediastinal silhouette is unchanged, with the heart being upper limits of normal in size. There is moderate chronic elevation of the right hemidiaphragm. No airspace consolidation, edema, pleural effusion, or pneumothorax is identified. Prior right shoulder arthroplasty and thoracic spondylosis are noted. IMPRESSION: No active disease. Electronically Signed   By: [name] M.D.   On: 08/06/2016  17:05   ASSESSMENT AND PLAN:   66 year old female patient with history of multiple medical problems of hypothyroidism, GERD, diabetes mellitus type 2,COPD,  history of morbid obesity s/pl post gastric sleeve, history of sleep apnea on CPAP at night  Is s/p anterior transposition of ulnar nerve by orthopedic. Patient  received general anesthesia. After surgery patient remained persistently hypoxic and O2 sat dropped to 84% on ambulation  1. Hypoxia post op after surgery sats dropped down in the 80s. Patient has history of COPD, sleep apnea and under the effect of anesthesia could've dropped his sats down -She will go home with oxygen. -Care management for oxygen -No wheezing,  2. Type 2 diabetes continue home meds  3. Hypothyroidism continue Synthroid  4. Status post anterior transposition of right ulnar nerve per Dr. Arne Cleveland from medical standpoint for discharge. Thank you for the consult Case discussed with Care Management/Social Worker. Management plans discussed with the patient, family and they are in agreement.  CODE STATUS: full  DVT Prophylaxis: ambulatory  TOTAL TIME TAKING CARE OF THIS PATIENT: *30* minutes.  >50% time spent on counselling and coordination of care  POSSIBLE D/C IN *1-2* DAYS, DEPENDING ON CLINICAL CONDITION.  Note: This dictation was prepared with Dragon dictation along with smaller phrase technology. Any transcriptional errors that result from this process are unintentional.  Myrtle Haller M.D on 08/07/2016 at 1:10 PM  Between 7am to 6pm - Pager - [phone]  After 6pm go to [email or web address] - password [email or web address] [facility]  Office  [phone]  CC: Primary care physician; [name], DO

## 2016-08-07 NOTE — Discharge Summary (Signed)
Physician Discharge Summary  Patient ID: [name] MRN: [id number] DOB/AGE: 67-06-1950 67 y.o.  Admit date: 08/06/2016 Discharge date: 08/07/2016  Admission Diagnoses:  entrapment of right ulnar nerve,cubital tunnel syndrome Hypoxia.  Discharge Diagnoses: Patient Active Problem List   Diagnosis Date Noted  . Shortness of breath 08/06/2016  . Upper respiratory tract infection 06/09/2016  . Low oxygen saturation 06/09/2016  . Chronic pain syndrome 02/25/2016  . Long term current use of opiate analgesic 02/25/2016  . Chronic right shoulder pain 02/25/2016  . DOE (dyspnea on exertion) 11/06/2015  . Status post reverse total shoulder replacement 10/08/2015  . 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 Morbid obesity with BMI of 40.0-44.9, adult ([place]) 09/23/2015  . Chronic prescription benzodiazepine use 09/23/2015  . Osteoarthritis, multiple sites 09/23/2015  . Other shoulder lesions (Right) 08/28/2015  . Opiate use (15 MME/Day) 01/16/2015  . Lateral femoral cutaneous entrapment syndrome (Right) 01/16/2015  . Mixed incontinence 11/26/2014  . Atrophic vaginitis 11/26/2014  . Anxiety and depression 09/21/2014  . Bladder cystocele 09/21/2014  . Gastro-esophageal reflux disease without esophagitis 09/21/2014  . DM (diabetes mellitus) type II controlled peripheral vascular disorder ([place]) 08/31/2014  . Diabetic peripheral neuropathy associated with type 2 diabetes mellitus ([place]) 08/31/2014  . Asthma, moderate persistent, well-controlled 08/31/2014  . Hypothyroidism, adult 08/31/2014  . Peripheral vascular disease due to secondary diabetes mellitus ([place]) 12/11/2013  . Apnea, sleep 10/19/2013  . Paroxysmal atrial fibrillation ([place]); CHA2DS2-Vasc Score 5. On Pradaxa 07/27/2013  . Essential hypertension 07/27/2013  Hypoxia.  Past Medical History:  Diagnosis Date  . Anxiety and depression   . Arthritis   . Asthma   . Cystocele   . Depression   .  Diabetes ([place])   . DVT (deep venous thrombosis) (HCC)    Righ calf  . GERD (gastroesophageal reflux disease)   . Heart murmur    Echocardiogram June 2015: Mild MR (possible vegetation seen on TTE, not seen on TEE), normal LV size with moderate concentric LVH. Normal function EF 60-65%. Normal diastolic function. Mild LA dilation.  Marland Kitchen History of IBS   . History of methicillin resistant staphylococcus aureus (MRSA) 2008  . Hypothyroidism   . Insomnia   . Kidney stone    kidney stones with lithotripsy  . Neuropathy involving both lower extremities   . Obesity, Class II, BMI 35-39.9, with comorbidity   . Paroxysmal atrial fibrillation ([place]) 2007   In June 2015, Cardiac Event Monitor: Mostly SR/sinus arrhythmia with PVCs that are frequent. Short bursts of A. fib lasting several minutes;; CHA2DS2-VASc Score = 5 (age, Female, PVD, DM, HTN)  . Peripheral vascular disease ([place])   . Sleep apnea    use C-PAP  . Venous stasis dermatitis of both lower extremities    Transfusion: None.   Consultants (if any):   Discharged Condition: Improved  Hospital Course: [name] is an 66 y.o. female who was admitted 08/06/2016 with a diagnosis of Right cubital tunnel syndrome in addition to hypoxia and went to the operating room on 08/06/2016 and underwent the above named procedures.    Surgeries: Procedure(s): ULNAR NERVE DECOMPRESSION/TRANSPOSITION on 08/06/2016 Patient tolerated the surgery well. Taken to PACU where she was stabilized and then transferred to the orthopedic floor after the patient was hypoxic following surgery.  She remained on Pradaxa 150mg  capsule twice daily. Foot pumps applied bilaterally at 80 mm. Heels elevated on bed with rolled towels. No evidence of DVT. Negative [place]. Respiratory Therapy started on POD1.  Patient's IV was d/c on POD1.  Implants: None.  She was given perioperative antibiotics:  Anti-infectives    Start     Dose/Rate Route Frequency Ordered Stop    08/06/16 1048  clindamycin (CLEOCIN) 900 MG/50ML IVPB    Comments:  LEWIS, CINDY: cabinet override      08/06/16 1048 08/06/16 1233   08/06/16 0000  fluconazole (DIFLUCAN) 100 MG tablet     100 mg Oral Daily 08/06/16 1143     08/05/16 2215  clindamycin (CLEOCIN) IVPB 900 mg     900 mg 100 mL/hr over 30 Minutes Intravenous  Once 08/05/16 2204 08/06/16 1303    .  She was given sequential compression devices, early ambulation, and Pradaxa for DVT prophylaxis.  She benefited maximally from the hospital stay and there were no complications.    Recent vital signs:  Vitals:   08/06/16 2049 08/06/16 2308  BP: (!) 131/59 (!) 148/69  Pulse: 75 68  Resp: 19 14  Temp: 97.9 F (36.6 C) 97.7 F (36.5 C)    Recent laboratory studies:  Lab Results  Component Value Date   HGB 11.4 (L) 08/06/2016   HGB 10.8 (L) 07/02/2016   HGB 11.7 (L) 06/09/2016   Lab Results  Component Value Date   WBC 4.8 08/06/2016   PLT 200 08/06/2016   Lab Results  Component Value Date   INR 1.18 09/26/2015   Lab Results  Component Value Date   NA 141 06/09/2016   K 4.8 07/28/2016   CL 106 06/09/2016   CO2 27 06/09/2016   BUN 19 06/09/2016   CREATININE 0.89 08/06/2016   GLUCOSE 118 (H) 06/09/2016    Discharge Medications:   Allergies as of 08/07/2016      Reactions   Penicillins Hives, Shortness Of Breath, Swelling   Facial swelling Has patient had a PCN reaction causing immediate rash, facial/tongue/throat swelling, SOB or lightheadedness with hypotension: Yes Has patient had a PCN reaction causing severe rash involving mucus membranes or skin necrosis: Yes Has patient had a PCN reaction that required hospitalization No Has patient had a PCN reaction occurring within the last 10 years: No If all of the above answers are "NO", then may proceed with Cephalosporin use.   Aspirin Hives   Terramycin [oxytetracycline] Hives      Medication List    TAKE these medications   acetaminophen 500 MG  tablet Commonly known as:  TYLENOL Take 1,000 mg by mouth every 8 (eight) hours as needed for mild pain.   albuterol (2.5 MG/3ML) 0.083% nebulizer solution Commonly known as:  PROVENTIL Take 3 mLs (2.5 mg total) by nebulization every 6 (six) hours as needed for wheezing or shortness of breath. What changed:  Another medication with the same name was changed. Make sure you understand how and when to take each.   albuterol 108 (90 Base) MCG/ACT inhaler Commonly known as:  PROVENTIL HFA;VENTOLIN HFA Inhale 1-2 puffs into the lungs every 4 (four) hours as needed for wheezing or shortness of breath. What changed:  how much to take   ALPRAZolam 0.5 MG tablet Commonly known as:  XANAX TAKE ONE TABLET TWICE DAILY AS NEEDED   buPROPion 300 MG 24 hr tablet Commonly known as:  WELLBUTRIN XL TAKE ONE TABLET BY MOUTH EVERY DAY   dabigatran 150 MG Caps capsule Commonly known as:  PRADAXA TAKE 1 CAPSULE BY MOUTH TWICE DAILY   Dexlansoprazole 30 MG capsule Take 1 capsule (30 mg total) by mouth daily.  DULoxetine 60 MG capsule Commonly known as:  CYMBALTA Take 1 capsule (60 mg total) by mouth daily. What changed:  when to take this   ESTRACE VAGINAL 0.1 MG/GM vaginal cream Generic drug:  estradiol INSERT 1 GRAM VAGINALLY TWICE A WEEK   ferrous sulfate 325 (65 FE) MG tablet Take 1 tablet (325 mg total) by mouth 2 (two) times daily.   fluconazole 100 MG tablet Commonly known as:  DIFLUCAN Take 1 tablet (100 mg total) by mouth daily.   fluticasone-salmeterol 115-21 MCG/ACT inhaler Commonly known as:  ADVAIR HFA Inhale 2 puffs into the lungs 2 (two) times daily.   gabapentin 800 MG tablet Commonly known as:  NEURONTIN Take 1 tablet (800 mg total) by mouth 4 (four) times daily.   HYDROcodone-acetaminophen 5-325 MG tablet Commonly known as:  NORCO/VICODIN Take 1 tablet by mouth every 8 (eight) hours as needed for severe pain. What changed:  Another medication with the same name was  removed. Continue taking this medication, and follow the directions you see here.   HYDROcodone-acetaminophen 5-325 MG tablet Commonly known as:  NORCO/VICODIN Take 1 tablet by mouth every 8 (eight) hours as needed for severe pain. Start taking on:  08/14/2016 What changed:  Another medication with the same name was removed. Continue taking this medication, and follow the directions you see here.   hydrocortisone cream 1 % Apply 1 application topically daily as needed for itching.   ipratropium 0.06 % nasal spray Commonly known as:  ATROVENT Place 2 sprays into both nostrils 4 (four) times daily. What changed:  when to take this  reasons to take this   levocetirizine 5 MG tablet Commonly known as:  XYZAL Take 1 tablet (5 mg total) by mouth every evening.   levothyroxine 150 MCG tablet Commonly known as:  SYNTHROID, LEVOTHROID TAKE ONE TABLET BY MOUTH EVERY MORNING   Melatonin 10 MG Caps Take 20 mg by mouth at bedtime as needed.   meloxicam 15 MG tablet Commonly known as:  MOBIC Take 15 mg by mouth daily.   metFORMIN 1000 MG tablet Commonly known as:  GLUCOPHAGE TAKE ONE TABLET BY MOUTH TWICE DAILY What changed:  See the new instructions.   metoprolol tartrate 25 MG tablet Commonly known as:  LOPRESSOR TAKE ONE TABLET TWICE DAILY   mirabegron ER 25 MG Tb24 tablet Commonly known as:  MYRBETRIQ Take 1 tablet (25 mg total) by mouth daily.   montelukast 10 MG tablet Commonly known as:  SINGULAIR TAKE ONE TABLET BY MOUTH EVERY DAY   oxybutynin 10 MG 24 hr tablet Commonly known as:  DITROPAN-XL Take 1 tablet (10 mg total) by mouth daily. What changed:  when to take this   oxyCODONE 5 MG immediate release tablet Commonly known as:  ROXICODONE Take 1-2 tablets (5-10 mg total) by mouth every 4 (four) hours as needed for severe pain.   quinapril 20 MG tablet Commonly known as:  ACCUPRIL TAKE ONE TABLET EVERY DAY       Diagnostic Studies: X-ray Chest Pa Or  Ap  Result Date: 08/06/2016 CLINICAL DATA:  Difficulty breathing after anesthesia today. EXAM: CHEST 1 VIEW COMPARISON:  06/09/2016 FINDINGS: The cardiomediastinal silhouette is unchanged, with the heart being upper limits of normal in size. There is moderate chronic elevation of the right hemidiaphragm. No airspace consolidation, edema, pleural effusion, or pneumothorax is identified. Prior right shoulder arthroplasty and thoracic spondylosis are noted. IMPRESSION: No active disease. Electronically Signed   By: Seymour Bars.D.  On: 08/06/2016 17:05   Disposition: Plan is for discharge home today on Home O2.  Follow-up Information    Poggi, Marshall Cork, MD. Go on 08/19/2016.   Specialty:  Surgery Why:  at 3:15pm with Doretha Imus information: [place] [facility] [place] [id number] [phone]          Signed: Judson Roch PA-C 08/07/2016, 8:04 AM

## 2016-08-07 NOTE — Care Management Obs Status (Signed)
[place] NOTIFICATION   Patient Details  Name: [name] MRN: [id number] Date of Birth: [birth year]   Medicare Observation Status Notification Given:  Yes    Jolly Mango, RN 08/07/2016, 9:35 AM

## 2016-08-07 NOTE — Care Management Note (Signed)
Case Management Note  Patient Details  Name: [name] MRN: [id number] Date of Birth: 1950/05/26  Subjective/Objective:  Discharging today                  Action/Plan: New home O2. Ordered from [facility]. COPD Hx. Qualifying O2 sats. Order for home health RN and PT. Pt not recommending home PT. She only wants Therapist, sports. Requests [facility]. Referral to sent to [facility] with [facility] .   Expected Discharge Date:  08/07/16               Expected Discharge Plan:  [place]  In-House Referral:     Discharge planning Services  CM Consult  Post Acute Care Choice:  Home Health Choice offered to:  Patient  DME Arranged:    DME Agency:     HH Arranged:  RN Kevin Agency:  [facility]  Status of Service:  Completed, signed off  If discussed at [employer] of Stay Meetings, dates discussed:    Additional Comments:  Jolly Mango, RN 08/07/2016, 11:26 AM

## 2016-08-07 NOTE — Progress Notes (Addendum)
Subjective: 1 Day Post-Op Procedure(s) (LRB): ULNAR NERVE DECOMPRESSION/TRANSPOSITION (Right) Patient reports pain as mild.   Patient is well, and has had no acute complaints or problems Plan is to go Home after hospital stay. Negative for chest pain and shortness of breath Fever: no Gastrointestinal:Negative for nausea and vomiting Currently in bed on room air, SpO2 100%.  Objective: Vital signs in last 24 hours: Temp:  [97.4 F (36.3 C)-98.6 F (37 C)] 97.7 F (36.5 C) (05/17 2308) Pulse Rate:  [55-75] 68 (05/17 2308) Resp:  [6-21] 14 (05/17 2308) BP: (110-161)/(41-90) 148/69 (05/17 2308) SpO2:  [88 %-100 %] 100 % (05/18 0607) Weight:  [114.3 kg (252 lb)-116.9 kg (257 lb 12.8 oz)] 116.9 kg (257 lb 12.8 oz) (05/17 2019)  Intake/Output from previous day:  Intake/Output Summary (Last 24 hours) at 08/07/16 0801 Last data filed at 08/07/16 0600  Gross per 24 hour  Intake          1448.33 ml  Output              103 ml  Net          1345.33 ml    Intake/Output this shift: No intake/output data recorded.  Labs:  Recent Labs  08/06/16 2054  HGB 11.4*    Recent Labs  08/06/16 2054  WBC 4.8  RBC 4.46  HCT 36.1  PLT 200    Recent Labs  08/06/16 2054  CREATININE 0.89   No results for input(s): LABPT, INR in the last 72 hours.   EXAM General - Patient is Alert, Appropriate and Oriented Extremity - ABD soft Sensation intact distally Intact pulses distally Dorsiflexion/Plantar flexion intact Incision: dressing C/D/I No cellulitis present Dressing/Incision - clean, dry, no drainage Motor Function - intact, moving foot and toes well on exam.  Past Medical History:  Diagnosis Date  . Anxiety and depression   . Arthritis   . Asthma   . Cystocele   . Depression   . Diabetes ([place])   . DVT (deep venous thrombosis) (HCC)    Righ calf  . GERD (gastroesophageal reflux disease)   . Heart murmur    Echocardiogram June 2015: Mild MR (possible vegetation seen  on TTE, not seen on TEE), normal LV size with moderate concentric LVH. Normal function EF 60-65%. Normal diastolic function. Mild LA dilation.  Marland Kitchen History of IBS   . History of methicillin resistant staphylococcus aureus (MRSA) 2008  . Hypothyroidism   . Insomnia   . Kidney stone    kidney stones with lithotripsy  . Neuropathy involving both lower extremities   . Obesity, Class II, BMI 35-39.9, with comorbidity   . Paroxysmal atrial fibrillation ([place]) 2007   In June 2015, Cardiac Event Monitor: Mostly SR/sinus arrhythmia with PVCs that are frequent. Short bursts of A. fib lasting several minutes;; CHA2DS2-VASc Score = 5 (age, Female, PVD, DM, HTN)  . Peripheral vascular disease ([place])   . Sleep apnea    use C-PAP  . Venous stasis dermatitis of both lower extremities    Assessment/Plan: 1 Day Post-Op Procedure(s) (LRB): ULNAR NERVE DECOMPRESSION/TRANSPOSITION (Right) Active Problems:   Shortness of breath History of COPD.   Estimated body mass index is 42.9 kg/m as calculated from the following:   Height as of this encounter: 5\' 5"  (1.651 m).   Weight as of this encounter: 116.9 kg (257 lb 12.8 oz). Advance diet   Patient was admitted overnight for observation due to hypoxia following surgery.  Has a history of hypoxia  following anesthesia, requiring home oxygen for several weeks after her reverse shoulder replacement.  Patient does have a history of COPD requiring oxygen in the past. Plan will be for discharge home today on home oxygen and follow-up with pulmonologist as outpatient.  DVT Prophylaxis - Foot Pumps, TED hose and Pradaxa Non-weightbearing to the right arm.  Raquel Indio Santilli, PA-C [facility] 08/07/2016, 8:01 AM

## 2016-08-10 ENCOUNTER — Telehealth: Payer: Self-pay | Admitting: Family Medicine

## 2016-08-10 DIAGNOSIS — R2689 Other abnormalities of gait and mobility: Secondary | ICD-10-CM | POA: Diagnosis not present

## 2016-08-10 DIAGNOSIS — I10 Essential (primary) hypertension: Secondary | ICD-10-CM | POA: Diagnosis not present

## 2016-08-10 DIAGNOSIS — R0602 Shortness of breath: Secondary | ICD-10-CM | POA: Diagnosis not present

## 2016-08-10 DIAGNOSIS — M1990 Unspecified osteoarthritis, unspecified site: Secondary | ICD-10-CM | POA: Diagnosis not present

## 2016-08-10 DIAGNOSIS — Z48811 Encounter for surgical aftercare following surgery on the nervous system: Secondary | ICD-10-CM | POA: Diagnosis not present

## 2016-08-10 DIAGNOSIS — F419 Anxiety disorder, unspecified: Secondary | ICD-10-CM | POA: Diagnosis not present

## 2016-08-10 DIAGNOSIS — M6281 Muscle weakness (generalized): Secondary | ICD-10-CM | POA: Diagnosis not present

## 2016-08-10 DIAGNOSIS — G894 Chronic pain syndrome: Secondary | ICD-10-CM | POA: Diagnosis not present

## 2016-08-10 NOTE — Telephone Encounter (Signed)
Please advise, thanks.

## 2016-08-10 NOTE — Telephone Encounter (Signed)
From [facility] called and stated that there is a delay in pt's care. They would like verbal orders to start pt's care today. Please advise, thank you!  Call [phone]

## 2016-08-11 NOTE — Telephone Encounter (Signed)
Ben from [facility] called and stated that they would like to get order for PT for xfering training and skilled nursing for surgical incision management. Please advise, thank you!  Call [place] @ [phone]

## 2016-08-11 NOTE — Telephone Encounter (Signed)
Spoke with Suezanne Jacquet for orders approval for therapy

## 2016-08-12 DIAGNOSIS — G4733 Obstructive sleep apnea (adult) (pediatric): Secondary | ICD-10-CM | POA: Diagnosis not present

## 2016-08-13 DIAGNOSIS — R2689 Other abnormalities of gait and mobility: Secondary | ICD-10-CM | POA: Diagnosis not present

## 2016-08-13 DIAGNOSIS — M1990 Unspecified osteoarthritis, unspecified site: Secondary | ICD-10-CM | POA: Diagnosis not present

## 2016-08-13 DIAGNOSIS — I10 Essential (primary) hypertension: Secondary | ICD-10-CM | POA: Diagnosis not present

## 2016-08-13 DIAGNOSIS — M6281 Muscle weakness (generalized): Secondary | ICD-10-CM | POA: Diagnosis not present

## 2016-08-13 DIAGNOSIS — G894 Chronic pain syndrome: Secondary | ICD-10-CM | POA: Diagnosis not present

## 2016-08-13 DIAGNOSIS — R0602 Shortness of breath: Secondary | ICD-10-CM | POA: Diagnosis not present

## 2016-08-13 DIAGNOSIS — F419 Anxiety disorder, unspecified: Secondary | ICD-10-CM | POA: Diagnosis not present

## 2016-08-13 DIAGNOSIS — Z48811 Encounter for surgical aftercare following surgery on the nervous system: Secondary | ICD-10-CM | POA: Diagnosis not present

## 2016-08-18 ENCOUNTER — Encounter: Payer: Self-pay | Admitting: Nurse Practitioner

## 2016-08-18 ENCOUNTER — Ambulatory Visit: Payer: PPO | Attending: Nurse Practitioner | Admitting: Nurse Practitioner

## 2016-08-18 VITALS — BP 138/65 | HR 54 | Temp 98.2°F | Resp 16 | Ht 64.5 in | Wt 248.0 lb

## 2016-08-18 DIAGNOSIS — M1991 Primary osteoarthritis, unspecified site: Secondary | ICD-10-CM | POA: Diagnosis not present

## 2016-08-18 DIAGNOSIS — M159 Polyosteoarthritis, unspecified: Secondary | ICD-10-CM

## 2016-08-18 DIAGNOSIS — E039 Hypothyroidism, unspecified: Secondary | ICD-10-CM | POA: Diagnosis not present

## 2016-08-18 DIAGNOSIS — I480 Paroxysmal atrial fibrillation: Secondary | ICD-10-CM | POA: Diagnosis not present

## 2016-08-18 DIAGNOSIS — M8949 Other hypertrophic osteoarthropathy, multiple sites: Secondary | ICD-10-CM

## 2016-08-18 DIAGNOSIS — G8929 Other chronic pain: Secondary | ICD-10-CM | POA: Diagnosis not present

## 2016-08-18 DIAGNOSIS — M25561 Pain in right knee: Secondary | ICD-10-CM | POA: Insufficient documentation

## 2016-08-18 DIAGNOSIS — M25562 Pain in left knee: Secondary | ICD-10-CM | POA: Diagnosis not present

## 2016-08-18 DIAGNOSIS — G894 Chronic pain syndrome: Secondary | ICD-10-CM | POA: Insufficient documentation

## 2016-08-18 DIAGNOSIS — F418 Other specified anxiety disorders: Secondary | ICD-10-CM

## 2016-08-18 DIAGNOSIS — K219 Gastro-esophageal reflux disease without esophagitis: Secondary | ICD-10-CM | POA: Insufficient documentation

## 2016-08-18 DIAGNOSIS — G4701 Insomnia due to medical condition: Secondary | ICD-10-CM | POA: Diagnosis not present

## 2016-08-18 DIAGNOSIS — K589 Irritable bowel syndrome without diarrhea: Secondary | ICD-10-CM | POA: Insufficient documentation

## 2016-08-18 DIAGNOSIS — Z880 Allergy status to penicillin: Secondary | ICD-10-CM | POA: Diagnosis not present

## 2016-08-18 DIAGNOSIS — T148XXA Other injury of unspecified body region, initial encounter: Secondary | ICD-10-CM | POA: Diagnosis not present

## 2016-08-18 DIAGNOSIS — M79672 Pain in left foot: Secondary | ICD-10-CM | POA: Diagnosis not present

## 2016-08-18 DIAGNOSIS — X58XXXA Exposure to other specified factors, initial encounter: Secondary | ICD-10-CM | POA: Diagnosis not present

## 2016-08-18 DIAGNOSIS — Z7901 Long term (current) use of anticoagulants: Secondary | ICD-10-CM | POA: Insufficient documentation

## 2016-08-18 DIAGNOSIS — Z7984 Long term (current) use of oral hypoglycemic drugs: Secondary | ICD-10-CM | POA: Insufficient documentation

## 2016-08-18 DIAGNOSIS — Z79899 Other long term (current) drug therapy: Secondary | ICD-10-CM | POA: Insufficient documentation

## 2016-08-18 DIAGNOSIS — M150 Primary generalized (osteo)arthritis: Secondary | ICD-10-CM | POA: Diagnosis not present

## 2016-08-18 DIAGNOSIS — M79671 Pain in right foot: Secondary | ICD-10-CM | POA: Insufficient documentation

## 2016-08-18 DIAGNOSIS — Z6841 Body Mass Index (BMI) 40.0 and over, adult: Secondary | ICD-10-CM | POA: Diagnosis not present

## 2016-08-18 DIAGNOSIS — E1142 Type 2 diabetes mellitus with diabetic polyneuropathy: Secondary | ICD-10-CM | POA: Diagnosis not present

## 2016-08-18 MED ORDER — DULOXETINE HCL 60 MG PO CPEP: 60.0000 mg | ORAL_CAPSULE | Freq: Every day | ORAL | 0 refills | Status: DC

## 2016-08-18 MED ORDER — HYDROCODONE-ACETAMINOPHEN 5-325 MG PO TABS: 1.0000 | ORAL_TABLET | Freq: Three times a day (TID) | ORAL | 0 refills | Status: DC | PRN

## 2016-08-18 MED ORDER — GABAPENTIN 800 MG PO TABS: 800.0000 mg | ORAL_TABLET | Freq: Four times a day (QID) | ORAL | 0 refills | Status: DC

## 2016-08-18 NOTE — Progress Notes (Signed)
Nursing Pain Medication Assessment:  Safety precautions to be maintained throughout the outpatient stay will include: orient to surroundings, keep bed in low position, maintain call bell within reach at all times, provide assistance with transfer out of bed and ambulation.  Medication Inspection Compliance: Pill count conducted under aseptic conditions, in front of the patient. Neither the pills nor the bottle was removed from the patient's sight at any time. Once count was completed pills were immediately returned to the patient in their original bottle.  Medication: See above Pill/Patch Count: 74 of 90 pills remain Pill/Patch Appearance: Markings consistent with prescribed medication Bottle Appearance: Standard pharmacy container. Clearly labeled. Filled Date: 5 / 26 / 2018 Last Medication intake:  Today

## 2016-08-18 NOTE — Patient Instructions (Addendum)
____________________________________________________________________________________________  Medication Rules  Applies to: All patients receiving prescriptions (written or electronic).  Pharmacy of record: Pharmacy where electronic prescriptions will be sent. If written prescriptions are taken to a different pharmacy, please inform the nursing staff. The pharmacy listed in the electronic medical record should be the one where you would like electronic prescriptions to be sent.  Prescription refills: Only during scheduled appointments. Applies to both, written and electronic prescriptions.  NOTE: The following applies primarily to controlled substances (Opioid Pain Medications)  Patient's responsibilities: 1. Pain Pills: Bring all pain pills to every appointment (except for procedure appointments). 2. Pill Bottles: Bring pills in original pharmacy bottle. Always bring newest bottle. Bring bottle, even if empty. 3. Medication refills: You are responsible for knowing and keeping track of what medications you need refilled. The day before your appointment, write a list of all prescriptions that need to be refilled. Bring that list to your appointment and give it to the admitting nurse. Prescriptions will be written only during appointments. If you forget a medication, it will not be "Called in", "Faxed", or "electronically sent". You will need to get another appointment to get these prescribed. 4. Prescription Accuracy: You are responsible for carefully inspecting your prescriptions before leaving our office. Have the discharge nurse carefully go over each prescription with you, before taking them home. Make sure that your name is accurately spelled, that your address is correct. Check the name and dose of your medication to make sure it is accurate. Check the number of pills, and the written instructions to make sure they are clear and accurate. Make sure that you are given enough medication to last  until your next medication refill appointment. 5. Taking Medication: Take medication as prescribed. Never take more pills than instructed. Never take medication more frequently than prescribed. Taking less pills or less frequently is permitted and encouraged, when it comes to controlled substances (written prescriptions).  6. Inform other Doctors: Always inform, all of your healthcare providers, of all the medications you take. 7. Pain Medication from other Providers: You are not allowed to accept any additional pain medication from any other Doctor or Healthcare provider. There are two exceptions to this rule. (see below) In the event that you require additional pain medication, you are responsible for notifying us, as stated below. 8. Medication Agreement: You are responsible for carefully reading and following our Medication Agreement. This must be signed before receiving any prescriptions from our practice. Safely store a copy of your signed Agreement. Violations to the Agreement will result in no further prescriptions. (Additional copies of our Medication Agreement are available upon request.) 9. Laws, Rules, & Regulations: All patients are expected to follow all Federal and State Laws, Statutes, Rules, & Regulations. Ignorance of the Laws does not constitute a valid excuse.  Exceptions: There are only two exceptions to the rule of not receiving pain medications from other Healthcare Providers. 1. Exception #1 (Emergencies): In the event of an emergency (i.e.: accident requiring emergency care), you are allowed to receive additional pain medication. However, you are responsible for: As soon as you are able, call our office [phone], at any time of the day or night, and leave a message stating your name, the date and nature of the emergency, and the name and dose of the medication prescribed. In the event that your call is answered by a member of our staff, make sure to document and save the date,  time, and the name of the person that   took your information.  2. Exception #2 (Planned Surgery): In the event that you are scheduled by another doctor or dentist to have any type of surgery or procedure, you are allowed (for a period no longer than 30 days), to receive additional pain medication, for the acute post-op pain. However, in this case, you are responsible for picking up a copy of our "Post-op Pain Management for Surgeons" handout, and giving it to your surgeon or dentist. This document is available at our office, and does not require an appointment to obtain it. Simply go to our office during business hours (Monday-Thursday from 8:00 AM to 4:00 PM) (Friday 8:00 AM to 12:00 Noon) or if you have a scheduled appointment with us, prior to your surgery, and ask for it by name. In addition, you will need to provide us with your name, name of your surgeon, type of surgery, and date of procedure or surgery.  _____________________________________________________________________________________________  ____________________________________________________________________________________________  Pain Scale  Introduction: The pain score used by this practice is the Verbal Numerical Rating Scale (VNRS-11). This is an 11-point scale. It is for adults and children 10 years or older. There are significant differences in how the pain score is reported, used, and applied. Forget everything you learned in the past and learn this scoring system.  General Information: The scale should reflect your current level of pain. Unless you are specifically asked for the level of your worst pain, or your average pain. If you are asked for one of these two, then it should be understood that it is over the past 24 hours.  Basic Activities of Daily Living (ADL): Personal hygiene, dressing, eating, transferring, and using restroom.  Instructions: Most patients tend to report their level of pain as a combination of two  factors, their physical pain and their psychosocial pain. This last one is also known as "suffering" and it is reflection of how physical pain affects you socially and psychologically. From now on, report them separately. From this point on, when asked to report your pain level, report only your physical pain. Use the following table for reference.  Pain Clinic Pain Levels (0-5/10)  Pain Level Score  Description  No Pain 0   Mild pain 1 Nagging, annoying, but does not interfere with basic activities of daily living (ADL). Patients are able to eat, bathe, get dressed, toileting (being able to get on and off the toilet and perform personal hygiene functions), transfer (move in and out of bed or a chair without assistance), and maintain continence (able to control bladder and bowel functions). Blood pressure and heart rate are unaffected. A normal heart rate for a healthy adult ranges from 60 to 100 bpm (beats per minute).   Mild to moderate pain 2 Noticeable and distracting. Impossible to hide from other people. More frequent flare-ups. Still possible to adapt and function close to normal. It can be very annoying and may have occasional stronger flare-ups. With discipline, patients may get used to it and adapt.   Moderate pain 3 Interferes significantly with activities of daily living (ADL). It becomes difficult to feed, bathe, get dressed, get on and off the toilet or to perform personal hygiene functions. Difficult to get in and out of bed or a chair without assistance. Very distracting. With effort, it can be ignored when deeply involved in activities.   Moderately severe pain 4 Impossible to ignore for more than a few minutes. With effort, patients may still be able to manage work or participate in   some social activities. Very difficult to concentrate. Signs of autonomic nervous system discharge are evident: dilated pupils (mydriasis); mild sweating (diaphoresis); sleep interference. Heart rate becomes  elevated (>115 bpm). Diastolic blood pressure (lower number) rises above 100 mmHg. Patients find relief in laying down and not moving.   Severe pain 5 Intense and extremely unpleasant. Associated with frowning face and frequent crying. Pain overwhelms the senses.  Ability to do any activity or maintain social relationships becomes significantly limited. Conversation becomes difficult. Pacing back and forth is common, as getting into a comfortable position is nearly impossible. Pain wakes you up from deep sleep. Physical signs will be obvious: pupillary dilation; increased sweating; goosebumps; brisk reflexes; cold, clammy hands and feet; nausea, vomiting or dry heaves; loss of appetite; significant sleep disturbance with inability to fall asleep or to remain asleep. When persistent, significant weight loss is observed due to the complete loss of appetite and sleep deprivation.  Blood pressure and heart rate becomes significantly elevated. Caution: If elevated blood pressure triggers a pounding headache associated with blurred vision, then the patient should immediately seek attention at an urgent or emergency care unit, as these may be signs of an impending stroke.    Emergency Department Pain Levels (6-10/10)  Emergency Room Pain 6 Severely limiting. Requires emergency care and should not be seen or managed at an outpatient pain management facility. Communication becomes difficult and requires great effort. Assistance to reach the emergency department may be required. Facial flushing and profuse sweating along with potentially dangerous increases in heart rate and blood pressure will be evident.   Distressing pain 7 Self-care is very difficult. Assistance is required to transport, or use restroom. Assistance to reach the emergency department will be required. Tasks requiring coordination, such as bathing and getting dressed become very difficult.   Disabling pain 8 Self-care is no longer possible. At  this level, pain is disabling. The individual is unable to do even the most "basic" activities such as walking, eating, bathing, dressing, transferring to a bed, or toileting. Fine motor skills are lost. It is difficult to think clearly.   Incapacitating pain 9 Pain becomes incapacitating. Thought processing is no longer possible. Difficult to remember your own name. Control of movement and coordination are lost.   The worst pain imaginable 10 At this level, most patients pass out from pain. When this level is reached, collapse of the autonomic nervous system occurs, leading to a sudden drop in blood pressure and heart rate. This in turn results in a temporary and dramatic drop in blood flow to the brain, leading to a loss of consciousness. Fainting is one of the body's self defense mechanisms. Passing out puts the brain in a calmed state and causes it to shut down for a while, in order to begin the healing process.    Summary: 1. Refer to this scale when providing us with your pain level. 2. Be accurate and careful when reporting your pain level. This will help with your care. 3. Over-reporting your pain level will lead to loss of credibility. 4. Even a level of 1/10 means that there is pain and will be treated at our facility. 5. High, inaccurate reporting will be documented as "Symptom Exaggeration", leading to loss of credibility and suspicions of possible secondary gains such as obtaining more narcotics, or wanting to appear disabled, for fraudulent reasons. 6. Only pain levels of 5 or below will be seen at our facility. 7. Pain levels of 6 and above will be   sent to the Emergency Department and the appointment cancelled. _____________________________________________________________________________________________   

## 2016-08-19 DIAGNOSIS — R0902 Hypoxemia: Secondary | ICD-10-CM | POA: Diagnosis not present

## 2016-08-19 DIAGNOSIS — J4530 Mild persistent asthma, uncomplicated: Secondary | ICD-10-CM | POA: Diagnosis not present

## 2016-08-19 DIAGNOSIS — R0609 Other forms of dyspnea: Secondary | ICD-10-CM | POA: Diagnosis not present

## 2016-08-20 ENCOUNTER — Telehealth: Payer: Self-pay | Admitting: Family Medicine

## 2016-08-20 NOTE — Telephone Encounter (Signed)
Ben from [facility] called and stated that patient declined discharge assessment as she has a lot of family matters that she needs to tend to. Please advise, thank you!  Call [place] @ [phone].

## 2016-08-21 NOTE — Telephone Encounter (Signed)
Please advise ,FYI.  

## 2016-08-27 ENCOUNTER — Telehealth: Payer: Self-pay | Admitting: *Deleted

## 2016-08-27 NOTE — Telephone Encounter (Signed)
Karrie Meres from [facility] reported that pt was discharged from physical therapy, due to pt avoiding appt's.  [employer] [phone]

## 2016-08-27 NOTE — Telephone Encounter (Signed)
Noted, forwarded as a FYI to PCP, thanks

## 2016-09-03 DIAGNOSIS — G4733 Obstructive sleep apnea (adult) (pediatric): Secondary | ICD-10-CM | POA: Diagnosis not present

## 2016-09-05 ENCOUNTER — Emergency Department: Payer: PPO

## 2016-09-05 ENCOUNTER — Inpatient Hospital Stay
Admission: EM | Admit: 2016-09-05 | Discharge: 2016-09-09 | DRG: 300 | Disposition: A | Discharge status: Home Health | Payer: PPO | Attending: Internal Medicine | Admitting: Internal Medicine

## 2016-09-05 ENCOUNTER — Encounter: Payer: Self-pay | Admitting: Emergency Medicine

## 2016-09-05 DIAGNOSIS — I10 Essential (primary) hypertension: Secondary | ICD-10-CM | POA: Diagnosis present

## 2016-09-05 DIAGNOSIS — Z6841 Body Mass Index (BMI) 40.0 and over, adult: Secondary | ICD-10-CM | POA: Diagnosis not present

## 2016-09-05 DIAGNOSIS — Z96619 Presence of unspecified artificial shoulder joint: Secondary | ICD-10-CM | POA: Diagnosis not present

## 2016-09-05 DIAGNOSIS — F32A Depression, unspecified: Secondary | ICD-10-CM | POA: Diagnosis present

## 2016-09-05 DIAGNOSIS — R0902 Hypoxemia: Secondary | ICD-10-CM | POA: Diagnosis present

## 2016-09-05 DIAGNOSIS — B9561 Methicillin susceptible Staphylococcus aureus infection as the cause of diseases classified elsewhere: Secondary | ICD-10-CM | POA: Diagnosis not present

## 2016-09-05 DIAGNOSIS — I480 Paroxysmal atrial fibrillation: Secondary | ICD-10-CM | POA: Diagnosis present

## 2016-09-05 DIAGNOSIS — E1142 Type 2 diabetes mellitus with diabetic polyneuropathy: Secondary | ICD-10-CM | POA: Diagnosis not present

## 2016-09-05 DIAGNOSIS — E039 Hypothyroidism, unspecified: Secondary | ICD-10-CM | POA: Diagnosis present

## 2016-09-05 DIAGNOSIS — L03116 Cellulitis of left lower limb: Secondary | ICD-10-CM

## 2016-09-05 DIAGNOSIS — K219 Gastro-esophageal reflux disease without esophagitis: Secondary | ICD-10-CM | POA: Diagnosis present

## 2016-09-05 DIAGNOSIS — M25572 Pain in left ankle and joints of left foot: Secondary | ICD-10-CM | POA: Diagnosis not present

## 2016-09-05 DIAGNOSIS — F1390 Sedative, hypnotic, or anxiolytic use, unspecified, uncomplicated: Secondary | ICD-10-CM | POA: Diagnosis present

## 2016-09-05 DIAGNOSIS — J45909 Unspecified asthma, uncomplicated: Secondary | ICD-10-CM | POA: Diagnosis not present

## 2016-09-05 DIAGNOSIS — Z880 Allergy status to penicillin: Secondary | ICD-10-CM

## 2016-09-05 DIAGNOSIS — G4730 Sleep apnea, unspecified: Secondary | ICD-10-CM | POA: Diagnosis not present

## 2016-09-05 DIAGNOSIS — L02612 Cutaneous abscess of left foot: Secondary | ICD-10-CM | POA: Diagnosis not present

## 2016-09-05 DIAGNOSIS — F329 Major depressive disorder, single episode, unspecified: Secondary | ICD-10-CM | POA: Diagnosis present

## 2016-09-05 DIAGNOSIS — L0390 Cellulitis, unspecified: Secondary | ICD-10-CM | POA: Diagnosis not present

## 2016-09-05 DIAGNOSIS — Z87442 Personal history of urinary calculi: Secondary | ICD-10-CM

## 2016-09-05 DIAGNOSIS — Z886 Allergy status to analgesic agent status: Secondary | ICD-10-CM

## 2016-09-05 DIAGNOSIS — Z8249 Family history of ischemic heart disease and other diseases of the circulatory system: Secondary | ICD-10-CM

## 2016-09-05 DIAGNOSIS — E119 Type 2 diabetes mellitus without complications: Secondary | ICD-10-CM | POA: Diagnosis not present

## 2016-09-05 DIAGNOSIS — Z833 Family history of diabetes mellitus: Secondary | ICD-10-CM

## 2016-09-05 DIAGNOSIS — Z96652 Presence of left artificial knee joint: Secondary | ICD-10-CM | POA: Diagnosis present

## 2016-09-05 DIAGNOSIS — J4541 Moderate persistent asthma with (acute) exacerbation: Secondary | ICD-10-CM | POA: Diagnosis present

## 2016-09-05 DIAGNOSIS — F418 Other specified anxiety disorders: Secondary | ICD-10-CM | POA: Diagnosis present

## 2016-09-05 DIAGNOSIS — M129 Arthropathy, unspecified: Secondary | ICD-10-CM | POA: Diagnosis not present

## 2016-09-05 DIAGNOSIS — Z96611 Presence of right artificial shoulder joint: Secondary | ICD-10-CM | POA: Diagnosis present

## 2016-09-05 DIAGNOSIS — J4540 Moderate persistent asthma, uncomplicated: Secondary | ICD-10-CM | POA: Diagnosis present

## 2016-09-05 DIAGNOSIS — Z9981 Dependence on supplemental oxygen: Secondary | ICD-10-CM | POA: Diagnosis not present

## 2016-09-05 DIAGNOSIS — R0600 Dyspnea, unspecified: Secondary | ICD-10-CM | POA: Diagnosis not present

## 2016-09-05 DIAGNOSIS — Z86718 Personal history of other venous thrombosis and embolism: Secondary | ICD-10-CM | POA: Diagnosis not present

## 2016-09-05 DIAGNOSIS — E069 Thyroiditis, unspecified: Secondary | ICD-10-CM | POA: Diagnosis not present

## 2016-09-05 DIAGNOSIS — E1169 Type 2 diabetes mellitus with other specified complication: Secondary | ICD-10-CM | POA: Diagnosis not present

## 2016-09-05 DIAGNOSIS — Z888 Allergy status to other drugs, medicaments and biological substances status: Secondary | ICD-10-CM

## 2016-09-05 DIAGNOSIS — J449 Chronic obstructive pulmonary disease, unspecified: Secondary | ICD-10-CM | POA: Diagnosis not present

## 2016-09-05 DIAGNOSIS — E1151 Type 2 diabetes mellitus with diabetic peripheral angiopathy without gangrene: Principal | ICD-10-CM | POA: Diagnosis present

## 2016-09-05 DIAGNOSIS — G8918 Other acute postprocedural pain: Secondary | ICD-10-CM | POA: Diagnosis not present

## 2016-09-05 DIAGNOSIS — E1351 Other specified diabetes mellitus with diabetic peripheral angiopathy without gangrene: Secondary | ICD-10-CM | POA: Diagnosis present

## 2016-09-05 DIAGNOSIS — Z8614 Personal history of Methicillin resistant Staphylococcus aureus infection: Secondary | ICD-10-CM

## 2016-09-05 DIAGNOSIS — M1712 Unilateral primary osteoarthritis, left knee: Secondary | ICD-10-CM | POA: Diagnosis not present

## 2016-09-05 DIAGNOSIS — Z79891 Long term (current) use of opiate analgesic: Secondary | ICD-10-CM

## 2016-09-05 DIAGNOSIS — G894 Chronic pain syndrome: Secondary | ICD-10-CM | POA: Diagnosis present

## 2016-09-05 DIAGNOSIS — R0602 Shortness of breath: Secondary | ICD-10-CM | POA: Diagnosis not present

## 2016-09-05 DIAGNOSIS — F419 Anxiety disorder, unspecified: Secondary | ICD-10-CM | POA: Diagnosis not present

## 2016-09-05 DIAGNOSIS — M65072 Abscess of tendon sheath, left ankle and foot: Secondary | ICD-10-CM | POA: Diagnosis not present

## 2016-09-05 DIAGNOSIS — Z7984 Long term (current) use of oral hypoglycemic drugs: Secondary | ICD-10-CM

## 2016-09-05 DIAGNOSIS — K589 Irritable bowel syndrome without diarrhea: Secondary | ICD-10-CM | POA: Diagnosis present

## 2016-09-05 DIAGNOSIS — M79662 Pain in left lower leg: Secondary | ICD-10-CM | POA: Diagnosis not present

## 2016-09-05 DIAGNOSIS — Z79899 Other long term (current) drug therapy: Secondary | ICD-10-CM

## 2016-09-05 DIAGNOSIS — L0291 Cutaneous abscess, unspecified: Secondary | ICD-10-CM | POA: Diagnosis not present

## 2016-09-05 DIAGNOSIS — G4733 Obstructive sleep apnea (adult) (pediatric): Secondary | ICD-10-CM | POA: Diagnosis present

## 2016-09-05 LAB — GLUCOSE, CAPILLARY: GLUCOSE-CAPILLARY: 173 mg/dL — AB (ref 65–99)

## 2016-09-05 LAB — URINALYSIS, COMPLETE (UACMP) WITH MICROSCOPIC
Bacteria, UA: NONE SEEN
Bilirubin Urine: NEGATIVE
GLUCOSE, UA: NEGATIVE mg/dL
HGB URINE DIPSTICK: NEGATIVE
KETONES UR: NEGATIVE mg/dL
LEUKOCYTES UA: NEGATIVE
NITRITE: NEGATIVE
Protein, ur: NEGATIVE mg/dL
RBC / HPF: NONE SEEN RBC/hpf (ref 0–5)
Specific Gravity, Urine: 1.014 (ref 1.005–1.030)
Squamous Epithelial / LPF: NONE SEEN
pH: 5 (ref 5.0–8.0)

## 2016-09-05 LAB — CBC
HCT: 35.9 % (ref 35.0–47.0)
HEMOGLOBIN: 12 g/dL (ref 12.0–16.0)
MCH: 27.7 pg (ref 26.0–34.0)
MCHC: 33.5 g/dL (ref 32.0–36.0)
MCV: 82.7 fL (ref 80.0–100.0)
Platelets: 220 10*3/uL (ref 150–440)
RBC: 4.35 MIL/uL (ref 3.80–5.20)
RDW: 19.3 % — ABNORMAL HIGH (ref 11.5–14.5)
WBC: 10.8 10*3/uL (ref 3.6–11.0)

## 2016-09-05 LAB — BASIC METABOLIC PANEL
ANION GAP: 8 (ref 5–15)
BUN: 22 mg/dL — ABNORMAL HIGH (ref 6–20)
CALCIUM: 9 mg/dL (ref 8.9–10.3)
CO2: 26 mmol/L (ref 22–32)
Chloride: 100 mmol/L — ABNORMAL LOW (ref 101–111)
Creatinine, Ser: 1.01 mg/dL — ABNORMAL HIGH (ref 0.44–1.00)
GFR calc Af Amer: >60 mL/min (ref >60)
GFR, EST NON AFRICAN AMERICAN: 57 mL/min — AB (ref >60)
Glucose, Bld: 190 mg/dL — ABNORMAL HIGH (ref 65–99)
Potassium: 4.8 mmol/L (ref 3.5–5.1)
SODIUM: 134 mmol/L — AB (ref 135–145)

## 2016-09-05 LAB — TROPONIN I
Troponin I: <0.03 ng/mL (ref <0.03)
Troponin I: <0.03 ng/mL (ref <0.03)

## 2016-09-05 MED ORDER — INSULIN ASPART 100 UNIT/ML ~~LOC~~ SOLN
0.0000 [IU] | Freq: Three times a day (TID) | SUBCUTANEOUS | Status: DC
  Administered 2016-09-06: 2 [IU] via SUBCUTANEOUS
  Administered 2016-09-06: 3 [IU] via SUBCUTANEOUS
  Administered 2016-09-07: 8 [IU] via SUBCUTANEOUS
  Administered 2016-09-07: 3 [IU] via SUBCUTANEOUS
  Administered 2016-09-08: 5 [IU] via SUBCUTANEOUS
  Administered 2016-09-08: 3 [IU] via SUBCUTANEOUS
  Administered 2016-09-08: 5 [IU] via SUBCUTANEOUS
  Administered 2016-09-09: 3 [IU] via SUBCUTANEOUS
  Filled 2016-09-05 (×8): qty 1

## 2016-09-05 MED ORDER — OXYBUTYNIN CHLORIDE ER 5 MG PO TB24
10.0000 mg | ORAL_TABLET | Freq: Every day | ORAL | Status: DC
  Administered 2016-09-05 – 2016-09-08 (×4): 10 mg via ORAL
  Filled 2016-09-05: qty 2
  Filled 2016-09-05 (×3): qty 1
  Filled 2016-09-05: qty 2

## 2016-09-05 MED ORDER — GABAPENTIN 400 MG PO CAPS
800.0000 mg | ORAL_CAPSULE | Freq: Four times a day (QID) | ORAL | Status: DC
  Administered 2016-09-05 – 2016-09-09 (×14): 800 mg via ORAL
  Filled 2016-09-05 (×15): qty 2

## 2016-09-05 MED ORDER — VANCOMYCIN HCL IN DEXTROSE 750-5 MG/150ML-% IV SOLN
750.0000 mg | Freq: Two times a day (BID) | INTRAVENOUS | Status: DC
  Administered 2016-09-06: 750 mg via INTRAVENOUS
  Filled 2016-09-05 (×3): qty 150

## 2016-09-05 MED ORDER — BUPROPION HCL ER (XL) 300 MG PO TB24
300.0000 mg | ORAL_TABLET | Freq: Every day | ORAL | Status: DC
  Administered 2016-09-06 – 2016-09-09 (×3): 300 mg via ORAL
  Filled 2016-09-05: qty 1
  Filled 2016-09-05: qty 2

## 2016-09-05 MED ORDER — HYDROCODONE-ACETAMINOPHEN 5-325 MG PO TABS
1.0000 | ORAL_TABLET | Freq: Once | ORAL | Status: AC
  Administered 2016-09-05: 1 via ORAL
  Filled 2016-09-05: qty 1

## 2016-09-05 MED ORDER — ONDANSETRON 4 MG PO TBDP
4.0000 mg | ORAL_TABLET | Freq: Once | ORAL | Status: AC
  Administered 2016-09-05: 4 mg via ORAL
  Filled 2016-09-05: qty 1

## 2016-09-05 MED ORDER — CLINDAMYCIN HCL 150 MG PO CAPS
300.0000 mg | ORAL_CAPSULE | Freq: Once | ORAL | Status: AC
  Administered 2016-09-05: 300 mg via ORAL
  Filled 2016-09-05: qty 2

## 2016-09-05 MED ORDER — PANTOPRAZOLE SODIUM 40 MG PO TBEC
40.0000 mg | DELAYED_RELEASE_TABLET | Freq: Every day | ORAL | Status: DC
  Administered 2016-09-06 – 2016-09-09 (×3): 40 mg via ORAL
  Filled 2016-09-05 (×3): qty 1

## 2016-09-05 MED ORDER — HYDROCODONE-ACETAMINOPHEN 5-325 MG PO TABS
1.0000 | ORAL_TABLET | Freq: Three times a day (TID) | ORAL | Status: DC | PRN
  Administered 2016-09-05: 1 via ORAL
  Filled 2016-09-05: qty 1

## 2016-09-05 MED ORDER — HYDROCODONE-ACETAMINOPHEN 7.5-325 MG PO TABS
1.0000 | ORAL_TABLET | Freq: Once | ORAL | Status: AC
  Administered 2016-09-05: 1 via ORAL
  Filled 2016-09-05: qty 1

## 2016-09-05 MED ORDER — SENNOSIDES-DOCUSATE SODIUM 8.6-50 MG PO TABS: 1.0000 | ORAL_TABLET | Freq: Every evening | ORAL | Status: DC | PRN

## 2016-09-05 MED ORDER — HYDROCODONE-ACETAMINOPHEN 5-325 MG PO TABS
1.0000 | ORAL_TABLET | Freq: Four times a day (QID) | ORAL | Status: DC | PRN
  Administered 2016-09-06 – 2016-09-09 (×7): 1 via ORAL
  Filled 2016-09-05 (×7): qty 1

## 2016-09-05 MED ORDER — MIRABEGRON ER 25 MG PO TB24
25.0000 mg | ORAL_TABLET | Freq: Every day | ORAL | Status: DC
  Administered 2016-09-06 – 2016-09-09 (×3): 25 mg via ORAL
  Filled 2016-09-05 (×3): qty 1

## 2016-09-05 MED ORDER — INSULIN ASPART 100 UNIT/ML ~~LOC~~ SOLN
0.0000 [IU] | Freq: Every day | SUBCUTANEOUS | Status: DC
  Administered 2016-09-06 – 2016-09-08 (×3): 2 [IU] via SUBCUTANEOUS
  Filled 2016-09-05 (×3): qty 1

## 2016-09-05 MED ORDER — IOPAMIDOL (ISOVUE-370) INJECTION 76%
75.0000 mL | Freq: Once | INTRAVENOUS | Status: AC | PRN
  Administered 2016-09-05: 75 mL via INTRAVENOUS

## 2016-09-05 MED ORDER — MONTELUKAST SODIUM 10 MG PO TABS
10.0000 mg | ORAL_TABLET | Freq: Every day | ORAL | Status: DC
  Administered 2016-09-05 – 2016-09-08 (×4): 10 mg via ORAL
  Filled 2016-09-05 (×4): qty 1

## 2016-09-05 MED ORDER — SODIUM CHLORIDE 0.9 % IV SOLN
1500.0000 mg | Freq: Once | INTRAVENOUS | Status: AC
  Administered 2016-09-05: 1500 mg via INTRAVENOUS
  Filled 2016-09-05: qty 1500

## 2016-09-05 MED ORDER — LEVOTHYROXINE SODIUM 150 MCG PO TABS
150.0000 ug | ORAL_TABLET | Freq: Every day | ORAL | Status: DC
  Administered 2016-09-06 – 2016-09-09 (×4): 150 ug via ORAL
  Filled 2016-09-05 (×4): qty 3

## 2016-09-05 MED ORDER — DABIGATRAN ETEXILATE MESYLATE 150 MG PO CAPS
150.0000 mg | ORAL_CAPSULE | Freq: Two times a day (BID) | ORAL | Status: DC
  Administered 2016-09-05: 150 mg via ORAL
  Filled 2016-09-05 (×2): qty 1

## 2016-09-05 MED ORDER — DULOXETINE HCL 60 MG PO CPEP
60.0000 mg | ORAL_CAPSULE | Freq: Every day | ORAL | Status: DC
  Administered 2016-09-06 – 2016-09-09 (×3): 60 mg via ORAL
  Filled 2016-09-05 (×3): qty 2

## 2016-09-05 MED ORDER — IPRATROPIUM-ALBUTEROL 0.5-2.5 (3) MG/3ML IN SOLN: 3.0000 mL | RESPIRATORY_TRACT | Status: DC | PRN

## 2016-09-05 MED ORDER — MOMETASONE FURO-FORMOTEROL FUM 200-5 MCG/ACT IN AERO
2.0000 | INHALATION_SPRAY | Freq: Two times a day (BID) | RESPIRATORY_TRACT | Status: DC
  Administered 2016-09-05 – 2016-09-09 (×7): 2 via RESPIRATORY_TRACT
  Filled 2016-09-05: qty 8.8

## 2016-09-05 MED ORDER — IPRATROPIUM-ALBUTEROL 0.5-2.5 (3) MG/3ML IN SOLN
3.0000 mL | Freq: Once | RESPIRATORY_TRACT | Status: AC
  Administered 2016-09-05: 3 mL via RESPIRATORY_TRACT
  Filled 2016-09-05: qty 3

## 2016-09-05 MED ORDER — FERROUS SULFATE 325 (65 FE) MG PO TABS
325.0000 mg | ORAL_TABLET | Freq: Two times a day (BID) | ORAL | Status: DC
  Administered 2016-09-05 – 2016-09-09 (×6): 325 mg via ORAL
  Filled 2016-09-05 (×7): qty 1

## 2016-09-05 MED ORDER — ALPRAZOLAM 0.5 MG PO TABS
0.5000 mg | ORAL_TABLET | Freq: Two times a day (BID) | ORAL | Status: DC | PRN
  Administered 2016-09-06 – 2016-09-08 (×2): 0.5 mg via ORAL
  Filled 2016-09-05 (×3): qty 1

## 2016-09-05 MED ORDER — LORATADINE 10 MG PO TABS: 10.0000 mg | ORAL_TABLET | Freq: Every day | ORAL | Status: DC | PRN

## 2016-09-05 MED ORDER — QUINAPRIL HCL 10 MG PO TABS
20.0000 mg | ORAL_TABLET | Freq: Every day | ORAL | Status: DC
  Administered 2016-09-06 – 2016-09-09 (×3): 20 mg via ORAL
  Filled 2016-09-05 (×3): qty 2

## 2016-09-05 MED ORDER — METOPROLOL TARTRATE 25 MG PO TABS
25.0000 mg | ORAL_TABLET | Freq: Two times a day (BID) | ORAL | Status: DC
  Administered 2016-09-05 – 2016-09-09 (×7): 25 mg via ORAL
  Filled 2016-09-05 (×7): qty 1

## 2016-09-05 NOTE — ED Notes (Signed)
Patient transported to Ultrasound 

## 2016-09-05 NOTE — Clinical Social Work Note (Signed)
CSW received consult for "home needs." RNCM would assist with needs in the home for home health. CSW has updated RNCM. Please consult should CSW needs arise. CSW is signing off.  Santiago Bumpers, MSW, Latanya Presser [phone]

## 2016-09-05 NOTE — ED Notes (Signed)
Pt up to restroom with assistance, pt able to give urine sample. Pt reports to this RN and Romie Minus that her balance has been off for a few weeks. Pt seen Dr. Lacinda Axon and he thought it was possibly inner ear, and if she continued to have issues with balance he was going to treat her for inner ear.  Pt very short of breath when ambulating short distance from bed beside toilet and back.

## 2016-09-05 NOTE — ED Notes (Signed)
Pt ambulated with pulse ox. Pt's O2 dropped to 84-85%. When pt was placed back in bed her O2 level on RA was 87-88%. Pt placed back on 2L and stat at 93%.

## 2016-09-05 NOTE — Progress Notes (Signed)
Pharmacy Antibiotic Note  [name] is a 66 y.o. female admitted on 09/05/2016 with cellulitis.  Pharmacy has been consulted for Vancomcyin dosing. Patient received vancomycin 1500mg  X 1 dose in ED.   Plan: DW: 78Kg    Ke: 0.061   T1/2: 11.4   Vd: 55   Will order vancomycin 750mg  IV every 12 hours with 9 hour stack dosing. Calculated trough at Css is 12.9. Trough ordered prior to 5th dose. Expect some accumulation. Monitor renal function and adjust dose as needed.   Weight: 248 lb (112.5 kg)  Temp (24hrs), Avg:99 F (37.2 C), Min:99 F (37.2 C), Max:99 F (37.2 C)   Recent Labs Lab 09/05/16 0933  WBC 10.8  CREATININE 1.01*    Estimated Creatinine Clearance: 67.9 mL/min (A) (by C-G formula based on SCr of 1.01 mg/dL (H)).    Allergies  Allergen Reactions  . Penicillins Hives, Shortness Of Breath and Swelling    Facial swelling Has patient had a PCN reaction causing immediate rash, facial/tongue/throat swelling, SOB or lightheadedness with hypotension: Yes Has patient had a PCN reaction causing severe rash involving mucus membranes or skin necrosis: Yes Has patient had a PCN reaction that required hospitalization No Has patient had a PCN reaction occurring within the last 10 years: No If all of the above answers are "NO", then may proceed with Cephalosporin use.   . Aspirin Hives  . Terramycin [Oxytetracycline] Hives    Antimicrobials this admission: 6/16 vancomycin >>   Dose adjustments this admission:   Microbiology results:  Thank you for allowing pharmacy to be a part of this patient's care.  Pernell Dupre, PharmD, BCPS Clinical Pharmacist 09/05/2016 4:24 PM

## 2016-09-05 NOTE — ED Triage Notes (Signed)
Pt to ED via [facility] from home for generalized weakness, right arm pain, and left leg/ankle pain and redness. Pt states that her symptoms started last night. Pt does not appear to be in distress at this time. VSS, Pt A & O x 4.

## 2016-09-05 NOTE — H&P (Signed)
PCP:   [name], DO   Chief Complaint:  LLE pain  HPI: This is a 66 year old female with history of diabetes mellitus, history shows as I do some gardening, in the evening she started feeling unwell. She states she is not confused but she was just unable to get herself together. This morning she woke up she severe pain in her left lower extremity and she was unable to stand. On examination extremity she was red, warm and tender to touch. She denies fevers, she does have chills. She came to the ER.  Review of Systems:  The patient denies anorexia, fever, weight loss,, vision loss, decreased hearing, hoarseness, chest pain, syncope, dyspnea on exertion, peripheral edema, balance deficits, hemoptysis, abdominal pain, melena, hematochezia, severe indigestion/heartburn, hematuria, incontinence, genital sores, leg pain, suspicious skin lesions, transient blindness, difficulty walking, depression, unusual weight change, abnormal bleeding, enlarged lymph nodes, angioedema, and breast masses.  Past Medical History: Past Medical History:  Diagnosis Date  . Anxiety and depression   . Arthritis   . Asthma   . Cystocele   . Depression   . Diabetes ([place])   . DVT (deep venous thrombosis) (HCC)    Righ calf  . GERD (gastroesophageal reflux disease)   . Heart murmur    Echocardiogram June 2015: Mild MR (possible vegetation seen on TTE, not seen on TEE), normal LV size with moderate concentric LVH. Normal function EF 60-65%. Normal diastolic function. Mild LA dilation.  Marland Kitchen History of IBS   . History of methicillin resistant staphylococcus aureus (MRSA) 2008  . Hypothyroidism   . Insomnia   . Kidney stone    kidney stones with lithotripsy  . Neuropathy involving both lower extremities   . Obesity, Class II, BMI 35-39.9, with comorbidity   . Paroxysmal atrial fibrillation ([place]) 2007   In June 2015, Cardiac Event Monitor: Mostly SR/sinus arrhythmia with PVCs that are frequent. Short bursts of A.  fib lasting several minutes;; CHA2DS2-VASc Score = 5 (age, Female, PVD, DM, HTN)  . Peripheral vascular disease ([place])   . Sleep apnea    use C-PAP  . Venous stasis dermatitis of both lower extremities    Past Surgical History:  Procedure Laterality Date  . ANKLE SURGERY Right   . APPLICATION OF WOUND VAC Left 06/27/2015   Procedure: APPLICATION OF WOUND VAC ( POSSIBLE ) ;  Surgeon: [name], MD;  Location: [facility] ORS;  Service: Vascular;  Laterality: Left;  . arm surgery     right    . CHOLECYSTECTOMY    . HERNIA REPAIR     umbilical  . HIATAL HERNIA REPAIR    . I&D EXTREMITY Left 06/27/2015   Procedure: IRRIGATION AND DEBRIDEMENT EXTREMITY            ( CALF HEMATOMA ) POSSIBLE WOUND VAC;  Surgeon: [name], MD;  Location: [facility] ORS;  Service: Vascular;  Laterality: Left;  . JOINT REPLACEMENT     left knee replacement  . KIDNEY SURGERY Right    kidney stones  . LITHOTRIPSY    . NM [facility] HX)  February 2017   Likely breast attenuation. LOW RISK study. Normal EF 55-60%.  . removal of left hematoma Left    leg  . REVERSE SHOULDER ARTHROPLASTY Right 10/08/2015   Procedure: REVERSE SHOULDER ARTHROPLASTY;  Surgeon: [name], MD;  Location: [facility] ORS;  Service: Orthopedics;  Laterality: Right;  . SLEEVE GASTROPLASTY    . TONSILLECTOMY    .  TOTAL KNEE ARTHROPLASTY    . TRANSESOPHAGEAL ECHOCARDIOGRAM  08/08/2013   Mild LVH, EF 60-65%. Moderate LA dilation and mild RA dilation. Mild MR with no evidence of stenosis and no evidence of endocarditis. A false chordae is noted.  . TRANSTHORACIC ECHOCARDIOGRAM  08/03/2013   Mild-Moderate concentric LVH, EF 60-65%. Normal diastolic function. Mild LA dilation. Mild MR with possible vegetation  - not confirmed on TEE   . ULNAR NERVE TRANSPOSITION Right 08/06/2016   Procedure: ULNAR NERVE DECOMPRESSION/TRANSPOSITION;  Surgeon: [name], MD;  Location: [facility] ORS;  Service: Orthopedics;  Laterality: Right;  Marland Kitchen VAGINAL HYSTERECTOMY       Medications: Prior to Admission medications   Medication Sig Start Date End Date Taking? Authorizing Provider  acetaminophen (TYLENOL) 500 MG tablet Take 1,000 mg by mouth every 8 (eight) hours as needed for mild pain.   Yes [provider]  albuterol (PROVENTIL HFA;VENTOLIN HFA) 108 (90 Base) MCG/ACT inhaler Inhale 1-2 puffs into the lungs every 4 (four) hours as needed for wheezing or shortness of breath. Patient taking differently: Inhale 2 puffs into the lungs every 4 (four) hours as needed for wheezing or shortness of breath.  03/29/15  Yes Bobetta Lime, MD  albuterol (PROVENTIL) (2.5 MG/3ML) 0.083% nebulizer solution Take 3 mLs (2.5 mg total) by nebulization every 6 (six) hours as needed for wheezing or shortness of breath. 02/21/15  Yes Ashok Norris, MD  ALPRAZolam Duanne Moron) 0.5 MG tablet TAKE ONE TABLET TWICE DAILY AS NEEDED 07/24/16  Yes Cook, Jayce G, DO  buPROPion (WELLBUTRIN XL) 300 MG 24 hr tablet TAKE ONE TABLET BY MOUTH EVERY DAY 07/22/16  Yes Cook, Jayce G, DO  dabigatran (PRADAXA) 150 MG CAPS capsule TAKE 1 CAPSULE BY MOUTH TWICE DAILY 04/24/16  Yes Cook, Jayce G, DO  Dexlansoprazole 30 MG capsule Take 1 capsule (30 mg total) by mouth daily. 06/30/16  Yes Cook, Jayce G, DO  DULoxetine (CYMBALTA) 60 MG capsule Take 1 capsule (60 mg total) by mouth daily. 09/13/16 12/12/16 Yes King, Diona Foley, NP  ESTRACE VAGINAL 0.1 MG/GM vaginal cream INSERT 1 GRAM VAGINALLY TWICE A WEEK Patient taking differently: INSERT 1 GRAM VAGINALLY TWICE A WEEK (Tuesday and Thursday) 09/18/15  Yes Thersa Salt G, DO  ferrous sulfate 325 (65 FE) MG tablet Take 1 tablet (325 mg total) by mouth 2 (two) times daily. 07/03/16  Yes Cook, Jayce G, DO  fluticasone-salmeterol (ADVAIR HFA) 270-62 MCG/ACT inhaler Inhale 2 puffs into the lungs 2 (two) times daily. 10/13/15  Yes Gouru, Illene Silver, MD  gabapentin (NEURONTIN) 800 MG tablet Take 1 tablet (800 mg total) by mouth 4 (four) times daily. Patient taking  differently: Take 600 mg by mouth 4 (four) times daily.  09/13/16 12/12/16 Yes Vevelyn Francois, NP  HYDROcodone-acetaminophen (NORCO/VICODIN) 5-325 MG tablet Take 1 tablet by mouth every 8 (eight) hours as needed for severe pain. 09/13/16 10/13/16 Yes Vevelyn Francois, NP  hydrocortisone cream 1 % Apply 1 application topically daily as needed for itching.   Yes [provider]  levocetirizine (XYZAL) 5 MG tablet Take 1 tablet (5 mg total) by mouth every evening. Patient taking differently: Take 5 mg by mouth daily as needed.  07/07/16  Yes Cook, Jayce G, DO  levothyroxine (SYNTHROID, LEVOTHROID) 150 MCG tablet TAKE ONE TABLET BY MOUTH EVERY MORNING 07/22/16  Yes Cook, Jayce G, DO  meloxicam (MOBIC) 15 MG tablet Take 15 mg by mouth daily.  08/22/15  Yes [provider]  metFORMIN (GLUCOPHAGE) 1000 MG  tablet TAKE ONE TABLET BY MOUTH TWICE DAILY Patient taking differently: TAKE ONE TABLET IN THE MORNING AND HALF TABLET AT NIGHT 05/21/16  Yes Crecencio Mc, MD  metoprolol tartrate (LOPRESSOR) 25 MG tablet TAKE ONE TABLET TWICE DAILY 07/01/16  Yes Cook, Jayce G, DO  mirabegron ER (MYRBETRIQ) 25 MG TB24 tablet Take 1 tablet (25 mg total) by mouth daily. 12/05/15  Yes McGowan, [name] A, PA-C  montelukast (SINGULAIR) 10 MG tablet TAKE ONE TABLET BY MOUTH EVERY DAY 04/24/16  Yes Cook, Jayce G, DO  oxybutynin (DITROPAN-XL) 10 MG 24 hr tablet Take 1 tablet (10 mg total) by mouth daily. Patient taking differently: Take 10 mg by mouth at bedtime.  12/05/15  Yes McGowan, [name] A, PA-C  quinapril (ACCUPRIL) 20 MG tablet TAKE ONE TABLET EVERY DAY 05/26/16  Yes Cook, Jayce G, DO  HYDROcodone-acetaminophen (NORCO/VICODIN) 5-325 MG tablet Take 1 tablet by mouth every 8 (eight) hours as needed for severe pain. Patient not taking: Reported on 09/05/2016 10/13/16 11/12/16  Vevelyn Francois, NP  HYDROcodone-acetaminophen (NORCO/VICODIN) 5-325 MG tablet Take 1 tablet by mouth every 8 (eight) hours as needed for severe  pain. Patient not taking: Reported on 09/05/2016 11/12/16 12/12/16  Vevelyn Francois, NP  ipratropium (ATROVENT) 0.06 % nasal spray Place 2 sprays into both nostrils 4 (four) times daily. Patient not taking: Reported on 09/05/2016 06/09/16   [name], DO    Allergies:   Allergies  Allergen Reactions  . Penicillins Hives, Shortness Of Breath and Swelling    Facial swelling Has patient had a PCN reaction causing immediate rash, facial/tongue/throat swelling, SOB or lightheadedness with hypotension: Yes Has patient had a PCN reaction causing severe rash involving mucus membranes or skin necrosis: Yes Has patient had a PCN reaction that required hospitalization No Has patient had a PCN reaction occurring within the last 10 years: No If all of the above answers are "NO", then may proceed with Cephalosporin use.   . Aspirin Hives  . Terramycin [Oxytetracycline] Hives    Social History:  reports that she has never smoked. She has never used smokeless tobacco. She reports that she does not drink alcohol or use drugs.  Family History: Family History  Problem Relation Age of Onset  . Skin cancer Father   . Diabetes Father   . Hypertension Father   . Peripheral vascular disease Father   . Cancer Father   . Cerebral aneurysm Father   . Alcohol abuse Father   . Varicose Veins Mother   . Kidney disease Mother   . Arthritis Mother   . Mental illness Sister   . Cancer Maternal Aunt        breast  . Breast cancer Maternal Aunt   . Arthritis Maternal Grandmother   . Hypertension Maternal Grandmother   . Diabetes Maternal Grandmother   . Arthritis Maternal Grandfather   . Heart disease Maternal Grandfather   . Hypertension Maternal Grandfather   . Arthritis Paternal Grandmother   . Hypertension Paternal Grandmother   . Diabetes Paternal Grandmother   . Arthritis Paternal Grandfather   . Hypertension Paternal Grandfather     Physical Exam: Vitals:   09/05/16 0916 09/05/16 0923  09/05/16 1000 09/05/16 1030  BP: (!) 149/67  124/67 (!) 144/65  Pulse: 81  80 82  Resp: 16  (!) 26 (!) 21  Temp: 99 F (37.2 C)     TempSrc: Oral     SpO2: 97%  98% 95%  Weight:  112.5  kg (248 lb)      General:  Alert and oriented times three, well developed and nourished, no acute distress Eyes: PERRLA, pink conjunctiva, no scleral icterus ENT: Moist oral mucosa, neck supple, no thyromegaly Lungs: clear to ascultation, no wheeze, no crackles, no use of accessory muscles Cardiovascular: regular rate and rhythm, no regurgitation, no gallops, no murmurs. No carotid bruits, no JVD Abdomen: soft, positive BS, non-tender, non-distended, no organomegaly, not an acute abdomen GU: not examined Neuro: CN II - XII grossly intact, sensation intact Musculoskeletal: strength 5/5 all extremities, no clubbing, cyanosis or edema, redness left lower extremity, tenderness to palpation Skin: no rash, no subcutaneous crepitation, no decubitus Psych: appropriate patient   Labs on Admission:   Recent Labs  09/05/16 0933  NA 134*  K 4.8  CL 100*  CO2 26  GLUCOSE 190*  BUN 22*  CREATININE 1.01*  CALCIUM 9.0   No results for input(s): AST, ALT, ALKPHOS, BILITOT, PROT, ALBUMIN in the last 72 hours. No results for input(s): LIPASE, AMYLASE in the last 72 hours.  Recent Labs  09/05/16 0933  WBC 10.8  HGB 12.0  HCT 35.9  MCV 82.7  PLT 220    Recent Labs  09/05/16 0933 09/05/16 1219  TROPONINI <0.03 <0.03   Invalid input(s): POCBNP No results for input(s): DDIMER in the last 72 hours. No results for input(s): HGBA1C in the last 72 hours. No results for input(s): CHOL, HDL, LDLCALC, TRIG, CHOLHDL, LDLDIRECT in the last 72 hours. No results for input(s): TSH, T4TOTAL, T3FREE, THYROIDAB in the last 72 hours.  Invalid input(s): FREET3 No results for input(s): VITAMINB12, FOLATE, FERRITIN, TIBC, IRON, RETICCTPCT in the last 72 hours.  Micro Results: No results found for this or any  previous visit (from the past 240 hour(s)).   Radiological Exams on Admission: Dg Chest 2 View  Result Date: 09/05/2016 CLINICAL DATA:  generalized weakness, right arm pain, and left leg/ankle pain and redness. Pt states that her symptoms started last night. SOB x 1 mo. Pt states she has ulnar surgery to right arm on 08/06/2016 and has been SOB since. EXAM: CHEST  2 VIEW COMPARISON:  08/06/2016 FINDINGS: Heart size is normal. Shallow lung inflation. Elevation of the right hemidiaphragm is stable. There is mild perihilar peribronchial thickening. There are no focal consolidations or pleural effusions. No pulmonary edema. Status post right reverse shoulder arthroplasty. IMPRESSION: 1. Bronchitic changes. 2.  No evidence for acute  abnormality. Electronically Signed   By: Nolon Nations M.D.   On: 09/05/2016 12:51   Ct Angio Chest Pe W Or Wo Contrast  Result Date: 09/05/2016 CLINICAL DATA:  Hypoxia. EXAM: CT ANGIOGRAPHY CHEST WITH CONTRAST TECHNIQUE: Multidetector CT imaging of the chest was performed using the standard protocol during bolus administration of intravenous contrast. Multiplanar CT image reconstructions and MIPs were obtained to evaluate the vascular anatomy. CONTRAST:  75 cc Isovue 370 intravenously. COMPARISON:  Chest radiograph 09/05/2016 FINDINGS: Cardiovascular: Satisfactory opacification of the pulmonary arteries to the segmental level. No evidence of pulmonary embolism. Mildly enlarged heart. No pericardial effusion. Mitral valve annular calcifications. Mediastinum/Nodes: No enlarged mediastinal, hilar, or axillary lymph nodes. Trachea and esophagus demonstrate no significant findings. Thyroid not visualized. Lungs/Pleura: Low lung volumes. Secondary crowding of the interstitial markings. Right lower lobe atelectasis. Upper Abdomen: Postcholecystectomy at and bariatric surgery. 1.2 cm probable left adrenal adenoma. Otherwise normal. Musculoskeletal: No chest wall abnormality. No acute or  significant osseous findings. Findings compatible with diffuse idiopathic skeletal hyperostosis of the thoracic  spine Review of the MIP images confirms the above findings. IMPRESSION: No evidence of pulmonary embolus. Mildly enlarged heart with mitral valve annular calcifications. Low lung volumes.  Right lower lobe atelectasis. Electronically Signed   By: Fidela Salisbury M.D.   On: 09/05/2016 15:04   US Venous Img Lower Unilateral Left  Result Date: 09/05/2016 CLINICAL DATA:  Left lower extremity pain and erythema EXAM: LEFT LOWER EXTREMITY VENOUS DOPPLER ULTRASOUND TECHNIQUE: Gray-scale sonography with graded compression, as well as color Doppler and duplex ultrasound were performed to evaluate the lower extremity deep venous systems from the level of the common femoral vein and including the common femoral, femoral, profunda femoral, popliteal and calf veins including the posterior tibial, peroneal and gastrocnemius veins when visible. The superficial great saphenous vein was also interrogated. Spectral Doppler was utilized to evaluate flow at rest and with distal augmentation maneuvers in the common femoral, femoral and popliteal veins. COMPARISON:  06/22/2008 left lower extremity venous Doppler scan. FINDINGS: Contralateral Common Femoral Vein: Respiratory phasicity is normal and symmetric with the symptomatic side. No evidence of thrombus. Normal compressibility. Common Femoral Vein: No evidence of thrombus. Normal compressibility, respiratory phasicity and response to augmentation. Saphenofemoral Junction: No evidence of thrombus. Normal compressibility and flow on color Doppler imaging. Profunda Femoral Vein: No evidence of thrombus. Normal compressibility and flow on color Doppler imaging. Femoral Vein: No evidence of thrombus. Normal compressibility, respiratory phasicity and response to augmentation. Popliteal Vein: No evidence of thrombus. Normal compressibility, respiratory phasicity and  response to augmentation. Calf Veins: No evidence of thrombus. Normal compressibility and flow on color Doppler imaging. Superficial Great Saphenous Vein: No evidence of thrombus. Normal compressibility and flow on color Doppler imaging. Venous Reflux:  None. Other Findings:  None. IMPRESSION: No evidence of DVT within the left lower extremity. Electronically Signed   By: Ilona Sorrel M.D.   On: 09/05/2016 12:03    Assessment/Plan Present on Admission: . Cellulitis LLE -Admit to MedSurg -Rocephin and vancomycin ordered  . Anxiety and depression -History of.  Marland Kitchen Apnea, sleep -CPAP QHS  . Asthma, moderate persistent, well-controlled -Stable, home medications resumed -Oxygen as needed -DuoNeb PRN  . Chronic pain syndrome -Stable, home medications resumed  . Diabetic peripheral neuropathy associated with type 2 diabetes mellitus (HCC) -ADA diet, sliding-scale insulin -Home medications resumed  . Essential hypertension -Stable, home medications resumed -PRN Blood pressure medication  . Hypothyroidism, adult -Stable, home medications resumed  . Paroxysmal atrial fibrillation ([place]); CHA2DS2-Vasc Score 5.  -On Pradaxa  . Peripheral vascular disease due to secondary diabetes mellitus ([place]) -aware   Marquesa Rath 09/05/2016, 3:48 PM

## 2016-09-05 NOTE — ED Provider Notes (Signed)
[facility] Emergency Department Provider Note  ____________________________________________   First MD Initiated Contact with Patient 09/05/16 1005     (approximate)  I have reviewed the triage vital signs and the nursing notes.   HISTORY  Chief Complaint Arm Pain; Weakness; and Leg Pain  HPI [name] is a 66 y.o. female history of multiple medical problems in the past including DVT, diabetes, hypoxia with a home oxygen concentrator, neuropathy and nerve decompression  Patient reports that for the last day she noted feeling generally weak, she's been expressing pain in her right arm but reports this is chronic and is treated by the pain medicine clinic, she is also noticed yesterday she began experiencing some discomfort and redness around her left lower leg and ankle. She reports she was outside in the yard and was spraying for bugs using a spray, when she came in to how she felt a little tired, she's felt chills last night, and increased fatigue. She has shortness of breath, reports this is at her relative baseline as she knows she is supposed to and has a home oxygen concentrator but doesn't use it frequently.  No chest pain. Reports pain in the right upper arm which is chronic and due to ulnar nerve problems and shoulder problems. No new chest pain or trouble breathing. She does report the left lower leg looks red. She is able to walk on it but it is sore and making it a little more difficult for her to walk.  No new numbness in the leg. No cold or blue foot. She does have chronic numbness from about the mid shins down due to neuropathy  Past Medical History:  Diagnosis Date  . Anxiety and depression   . Arthritis   . Asthma   . Cystocele   . Depression   . Diabetes ([place])   . DVT (deep venous thrombosis) (HCC)    Righ calf  . GERD (gastroesophageal reflux disease)   . Heart murmur    Echocardiogram June 2015: Mild MR (possible vegetation seen  on TTE, not seen on TEE), normal LV size with moderate concentric LVH. Normal function EF 60-65%. Normal diastolic function. Mild LA dilation.  Marland Kitchen History of IBS   . History of methicillin resistant staphylococcus aureus (MRSA) 2008  . Hypothyroidism   . Insomnia   . Kidney stone    kidney stones with lithotripsy  . Neuropathy involving both lower extremities   . Obesity, Class II, BMI 35-39.9, with comorbidity   . Paroxysmal atrial fibrillation ([place]) 2007   In June 2015, Cardiac Event Monitor: Mostly SR/sinus arrhythmia with PVCs that are frequent. Short bursts of A. fib lasting several minutes;; CHA2DS2-VASc Score = 5 (age, Female, PVD, DM, HTN)  . Peripheral vascular disease ([place])   . Sleep apnea    use C-PAP  . Venous stasis dermatitis of both lower extremities     Patient Active Problem List   Diagnosis Date Noted  . Shortness of breath 08/06/2016  . Upper respiratory tract infection 06/09/2016  . Low oxygen saturation 06/09/2016  . Chronic pain syndrome 02/25/2016  . Long term current use of opiate analgesic 02/25/2016  . Chronic right shoulder pain 02/25/2016  . DOE (dyspnea on exertion) 11/06/2015  . Status post reverse total shoulder replacement 10/08/2015  . 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 Morbid obesity with BMI of 40.0-44.9, adult ([place]) 09/23/2015  . Chronic prescription benzodiazepine use 09/23/2015  . Osteoarthritis, multiple sites  09/23/2015  . Other shoulder lesions (Right) 08/28/2015  . Opiate use (15 MME/Day) 01/16/2015  . Lateral femoral cutaneous entrapment syndrome (Right) 01/16/2015  . Mixed incontinence 11/26/2014  . Atrophic vaginitis 11/26/2014  . Anxiety and depression 09/21/2014  . Bladder cystocele 09/21/2014  . Gastro-esophageal reflux disease without esophagitis 09/21/2014  . DM (diabetes mellitus) type II controlled peripheral vascular disorder ([place]) 08/31/2014  . Diabetic peripheral neuropathy associated with  type 2 diabetes mellitus ([place]) 08/31/2014  . Asthma, moderate persistent, well-controlled 08/31/2014  . Hypothyroidism, adult 08/31/2014  . Peripheral vascular disease due to secondary diabetes mellitus ([place]) 12/11/2013  . Apnea, sleep 10/19/2013  . Paroxysmal atrial fibrillation ([place]); CHA2DS2-Vasc Score 5. On Pradaxa 07/27/2013  . Essential hypertension 07/27/2013    Past Surgical History:  Procedure Laterality Date  . ANKLE SURGERY Right   . APPLICATION OF WOUND VAC Left 06/27/2015   Procedure: APPLICATION OF WOUND VAC ( POSSIBLE ) ;  Surgeon: [name], MD;  Location: [facility] ORS;  Service: Vascular;  Laterality: Left;  . arm surgery     right    . CHOLECYSTECTOMY    . HERNIA REPAIR     umbilical  . HIATAL HERNIA REPAIR    . I&D EXTREMITY Left 06/27/2015   Procedure: IRRIGATION AND DEBRIDEMENT EXTREMITY            ( CALF HEMATOMA ) POSSIBLE WOUND VAC;  Surgeon: [name], MD;  Location: [facility] ORS;  Service: Vascular;  Laterality: Left;  . JOINT REPLACEMENT     left knee replacement  . KIDNEY SURGERY Right    kidney stones  . LITHOTRIPSY    . NM [facility] HX)  February 2017   Likely breast attenuation. LOW RISK study. Normal EF 55-60%.  . removal of left hematoma Left    leg  . REVERSE SHOULDER ARTHROPLASTY Right 10/08/2015   Procedure: REVERSE SHOULDER ARTHROPLASTY;  Surgeon: [name], MD;  Location: [facility] ORS;  Service: Orthopedics;  Laterality: Right;  . SLEEVE GASTROPLASTY    . TONSILLECTOMY    . TOTAL KNEE ARTHROPLASTY    . TRANSESOPHAGEAL ECHOCARDIOGRAM  08/08/2013   Mild LVH, EF 60-65%. Moderate LA dilation and mild RA dilation. Mild MR with no evidence of stenosis and no evidence of endocarditis. A false chordae is noted.  . TRANSTHORACIC ECHOCARDIOGRAM  08/03/2013   Mild-Moderate concentric LVH, EF 60-65%. Normal diastolic function. Mild LA dilation. Mild MR with possible vegetation  - not confirmed on TEE   . ULNAR NERVE TRANSPOSITION Right 08/06/2016    Procedure: ULNAR NERVE DECOMPRESSION/TRANSPOSITION;  Surgeon: [name], MD;  Location: [facility] ORS;  Service: Orthopedics;  Laterality: Right;  Marland Kitchen VAGINAL HYSTERECTOMY      Prior to Admission medications   Medication Sig Start Date End Date Taking? Authorizing Provider  acetaminophen (TYLENOL) 500 MG tablet Take 1,000 mg by mouth every 8 (eight) hours as needed for mild pain.    [provider]  albuterol (PROVENTIL HFA;VENTOLIN HFA) 108 (90 Base) MCG/ACT inhaler Inhale 1-2 puffs into the lungs every 4 (four) hours as needed for wheezing or shortness of breath. Patient taking differently: Inhale 2 puffs into the lungs every 4 (four) hours as needed for wheezing or shortness of breath.  03/29/15   Bobetta Lime, MD  albuterol (PROVENTIL) (2.5 MG/3ML) 0.083% nebulizer solution Take 3 mLs (2.5 mg total) by nebulization every 6 (six) hours as needed for wheezing or shortness of breath. 02/21/15   Ashok Norris, MD  ALPRAZolam (  XANAX) 0.5 MG tablet TAKE ONE TABLET TWICE DAILY AS NEEDED 07/24/16   Thersa Salt G, DO  buPROPion (WELLBUTRIN XL) 300 MG 24 hr tablet TAKE ONE TABLET BY MOUTH EVERY DAY 07/22/16   [name], DO  dabigatran (PRADAXA) 150 MG CAPS capsule TAKE 1 CAPSULE BY MOUTH TWICE DAILY 04/24/16   Thersa Salt G, DO  Dexlansoprazole 30 MG capsule Take 1 capsule (30 mg total) by mouth daily. 06/30/16   [name], DO  DULoxetine (CYMBALTA) 60 MG capsule Take 1 capsule (60 mg total) by mouth daily. 09/13/16 12/12/16  Vevelyn Francois, NP  ESTRACE VAGINAL 0.1 MG/GM vaginal cream INSERT 1 GRAM VAGINALLY TWICE A WEEK 09/18/15   Thersa Salt G, DO  ferrous sulfate 325 (65 FE) MG tablet Take 1 tablet (325 mg total) by mouth 2 (two) times daily. 07/03/16   [name], DO  fluconazole (DIFLUCAN) 100 MG tablet Take 1 tablet (100 mg total) by mouth daily. 08/06/16   Poggi, Marshall Cork, MD  fluticasone-salmeterol (ADVAIR HFA) 875-64 MCG/ACT inhaler Inhale 2 puffs into the lungs 2 (two) times daily.  10/13/15   Nicholes Mango, MD  gabapentin (NEURONTIN) 800 MG tablet Take 1 tablet (800 mg total) by mouth 4 (four) times daily. 09/13/16 12/12/16  Vevelyn Francois, NP  HYDROcodone-acetaminophen (NORCO/VICODIN) 5-325 MG tablet Take 1 tablet by mouth every 8 (eight) hours as needed for severe pain. 09/13/16 10/13/16  Vevelyn Francois, NP  HYDROcodone-acetaminophen (NORCO/VICODIN) 5-325 MG tablet Take 1 tablet by mouth every 8 (eight) hours as needed for severe pain. 10/13/16 11/12/16  Vevelyn Francois, NP  HYDROcodone-acetaminophen (NORCO/VICODIN) 5-325 MG tablet Take 1 tablet by mouth every 8 (eight) hours as needed for severe pain. 11/12/16 12/12/16  Vevelyn Francois, NP  hydrocortisone cream 1 % Apply 1 application topically daily as needed for itching.    [provider]  ipratropium (ATROVENT) 0.06 % nasal spray Place 2 sprays into both nostrils 4 (four) times daily. Patient taking differently: Place 2 sprays into both nostrils 2 (two) times daily as needed for rhinitis.  06/09/16   [name], DO  levocetirizine (XYZAL) 5 MG tablet Take 1 tablet (5 mg total) by mouth every evening. 07/07/16   [name], DO  levothyroxine (SYNTHROID, LEVOTHROID) 150 MCG tablet TAKE ONE TABLET BY MOUTH EVERY MORNING 07/22/16   [name], DO  meloxicam (MOBIC) 15 MG tablet Take 15 mg by mouth daily.  08/22/15   [provider]  metFORMIN (GLUCOPHAGE) 1000 MG tablet TAKE ONE TABLET BY MOUTH TWICE DAILY Patient taking differently: TAKE ONE TABLET IN THE MORNING AND HALF TABLET AT NIGHT 05/21/16   Crecencio Mc, MD  metoprolol tartrate (LOPRESSOR) 25 MG tablet TAKE ONE TABLET TWICE DAILY 07/01/16   Thersa Salt G, DO  mirabegron ER (MYRBETRIQ) 25 MG TB24 tablet Take 1 tablet (25 mg total) by mouth daily. 12/05/15   McGowan, [name] A, PA-C  montelukast (SINGULAIR) 10 MG tablet TAKE ONE TABLET BY MOUTH EVERY DAY 04/24/16   Thersa Salt G, DO  oxybutynin (DITROPAN-XL) 10 MG 24 hr tablet Take 1 tablet (10 mg total) by  mouth daily. Patient taking differently: Take 10 mg by mouth at bedtime.  12/05/15   Zara Council A, PA-C  quinapril (ACCUPRIL) 20 MG tablet TAKE ONE TABLET EVERY DAY 05/26/16   [name], DO    Allergies Penicillins; Aspirin; and Terramycin [oxytetracycline]  Family History  Problem Relation Age of Onset  .  Skin cancer Father   . Diabetes Father   . Hypertension Father   . Peripheral vascular disease Father   . Cancer Father   . Cerebral aneurysm Father   . Alcohol abuse Father   . Varicose Veins Mother   . Kidney disease Mother   . Arthritis Mother   . Mental illness Sister   . Cancer Maternal Aunt        breast  . Breast cancer Maternal Aunt   . Arthritis Maternal Grandmother   . Hypertension Maternal Grandmother   . Diabetes Maternal Grandmother   . Arthritis Maternal Grandfather   . Heart disease Maternal Grandfather   . Hypertension Maternal Grandfather   . Arthritis Paternal Grandmother   . Hypertension Paternal Grandmother   . Diabetes Paternal Grandmother   . Arthritis Paternal Grandfather   . Hypertension Paternal Grandfather     Social History Social History  Substance Use Topics  . Smoking status: Never Smoker  . Smokeless tobacco: Never Used  . Alcohol use No    Review of Systems Constitutional: No feverBut some fatigue and chills Eyes: No visual changes. ENT: No sore throat. Cardiovascular: Denies chest pain. Respiratory: Denies shortness of breath. Gastrointestinal: No abdominal pain.  No nausea, no vomiting.  No diarrhea.  No constipation. Genitourinary: Negative for dysuria. Musculoskeletal: Negative for back pain. Skin: See history of present illness. No tick bites. I removed a text from her body. Neurological: Negative for headaches, focal weakness or numbness.    ____________________________________________   PHYSICAL EXAM:  VITAL SIGNS: ED Triage Vitals  Enc Vitals Group     BP 09/05/16 0916 (!) 149/67     Pulse Rate  09/05/16 0916 81     Resp 09/05/16 0916 16     Temp 09/05/16 0916 99 F (37.2 C)     Temp Source 09/05/16 0916 Oral     SpO2 09/05/16 0916 97 %     Weight 09/05/16 0923 248 lb (112.5 kg)     Height --      Head Circumference --      Peak Flow --      Pain Score 09/05/16 0915 8     Pain Loc --      Pain Edu? --      Excl. in [place]? --     Constitutional: Alert and oriented. Well appearing and in no acute distress.She does appear chronically ill, but in no acute extremities. Patient, her husband, and her friend are all very pleasant. Eyes: Conjunctivae are normal. Head: Atraumatic. Nose: No congestion/rhinnorhea. Mouth/Throat: Mucous membranes are moist. Neck: No stridor.   Cardiovascular: Normal rate, regular rhythm. Grossly normal heart sounds.  Good peripheral circulation. Respiratory: Normal respiratory effort.  No retractions. Lungs CTAB. Saturation of 97% on room air, 99% on the 2 L which she reports is supposed to be her baseline Gastrointestinal: Soft and nontender. No distention. Musculoskeletal:   Lower Extremities  No edema. Normal DP/PT pulses bilateral with good cap refill.  Normal neuro-motor function lower extremities bilateral.  RIGHT Right lower extremity demonstrates normal strength, good use of all muscles. No edema bruising or contusions of the right hip, right knee, right ankle. Full range of motion of the right lower extremity without pain. No pain on axial loading. No evidence of trauma.  LEFT Left lower extremity demonstrates normal strength, good use of all muscles. No edema bruising or contusions of the hip,  knee, ankle. There is however venous stasis changes, and also an area of  redness with blanching erythema and tenderness over the left posterior ankle, posterior calf without crepitance or drainage. There is a small area that looks like possibly a cold bug bite or small area of scabbing in the region of the right ankle. The patient denies tenderness to  [facility] test and is able to dorsi and plantarflex the left ankle well. Full range of motion of the left lower extremity. No pain on axial loading. No evidence of trauma.   Neurologic:  Normal speech and language. No gross focal neurologic deficits are appreciated.  Skin:  Skin is warm, dry and intact. No rash noted. Psychiatric: Mood and affect are normal. Speech and behavior are normal.  ____________________________________________   LABS (all labs ordered are listed, but only abnormal results are displayed)  Labs Reviewed  BASIC METABOLIC PANEL - Abnormal; Notable for the following:       Result Value   Sodium 134 (*)    Chloride 100 (*)    Glucose, Bld 190 (*)    BUN 22 (*)    Creatinine, Ser 1.01 (*)    GFR calc non Af Amer 57 (*)    All other components within normal limits  CBC - Abnormal; Notable for the following:    RDW 19.3 (*)    All other components within normal limits  URINALYSIS, COMPLETE (UACMP) WITH MICROSCOPIC - Abnormal; Notable for the following:    Color, Urine YELLOW (*)    APPearance CLEAR (*)    All other components within normal limits  TROPONIN I  TROPONIN I  ROCKY MTN SPOTTED FVR ABS PNL(IGG+IGM)  CBG MONITORING, ED   ____________________________________________  EKG  Reviewed and interpreted by me 9:23 AM Normal sinus rhythm suspected, though there are areas where questionable extra H reveal P waves may be present on the EKG. Believe it is likely sinus rhythm Heart rate 80 QRS 95 QTC 4:30 T-wave inversions in 1 to appear new compared with previous. There is questionably some minimal T-wave depression in V5  Compared to the patient's previous EKG from 03/05/2016, there are new significant changes including no new right axis deviation, and condition T-wave inversions in 1 and 2 and possibly slight depression in V5 are new compared with previous. Of note however, the patient is not complaining of any cardiac symptoms. She does complain of pain  in the right elbow and right shoulder, but reports these are chronic pains which seem to be unchanged recently  ____________________________________________  RADIOLOGY  Dg Chest 2 View  Result Date: 09/05/2016 CLINICAL DATA:  generalized weakness, right arm pain, and left leg/ankle pain and redness. Pt states that her symptoms started last night. SOB x 1 mo. Pt states she has ulnar surgery to right arm on 08/06/2016 and has been SOB since. EXAM: CHEST  2 VIEW COMPARISON:  08/06/2016 FINDINGS: Heart size is normal. Shallow lung inflation. Elevation of the right hemidiaphragm is stable. There is mild perihilar peribronchial thickening. There are no focal consolidations or pleural effusions. No pulmonary edema. Status post right reverse shoulder arthroplasty. IMPRESSION: 1. Bronchitic changes. 2.  No evidence for acute  abnormality. Electronically Signed   By: Nolon Nations M.D.   On: 09/05/2016 12:51   US Venous Img Lower Unilateral Left  Result Date: 09/05/2016 CLINICAL DATA:  Left lower extremity pain and erythema EXAM: LEFT LOWER EXTREMITY VENOUS DOPPLER ULTRASOUND TECHNIQUE: Gray-scale sonography with graded compression, as well as color Doppler and duplex ultrasound were performed to evaluate the lower extremity deep venous systems from  the level of the common femoral vein and including the common femoral, femoral, profunda femoral, popliteal and calf veins including the posterior tibial, peroneal and gastrocnemius veins when visible. The superficial great saphenous vein was also interrogated. Spectral Doppler was utilized to evaluate flow at rest and with distal augmentation maneuvers in the common femoral, femoral and popliteal veins. COMPARISON:  06/22/2008 left lower extremity venous Doppler scan. FINDINGS: Contralateral Common Femoral Vein: Respiratory phasicity is normal and symmetric with the symptomatic side. No evidence of thrombus. Normal compressibility. Common Femoral Vein: No evidence  of thrombus. Normal compressibility, respiratory phasicity and response to augmentation. Saphenofemoral Junction: No evidence of thrombus. Normal compressibility and flow on color Doppler imaging. Profunda Femoral Vein: No evidence of thrombus. Normal compressibility and flow on color Doppler imaging. Femoral Vein: No evidence of thrombus. Normal compressibility, respiratory phasicity and response to augmentation. Popliteal Vein: No evidence of thrombus. Normal compressibility, respiratory phasicity and response to augmentation. Calf Veins: No evidence of thrombus. Normal compressibility and flow on color Doppler imaging. Superficial Great Saphenous Vein: No evidence of thrombus. Normal compressibility and flow on color Doppler imaging. Venous Reflux:  None. Other Findings:  None. IMPRESSION: No evidence of DVT within the left lower extremity. Electronically Signed   By: Ilona Sorrel M.D.   On: 09/05/2016 12:03    ____________________________________________   PROCEDURES  Procedure(s) performed: None  Procedures  Critical Care performed: No  ____________________________________________   INITIAL IMPRESSION / ASSESSMENT AND PLAN / ED COURSE  Pertinent labs & imaging results that were available during my care of the patient were reviewed by me and considered in my medical decision making (see chart for details).       ----------------------------------------- 2:18 PM on 09/05/2016 -----------------------------------------  Cellulitis of the left lower extremity noted. No evidence of DVT. No traumatic injury or evidence of fracture or Achilles rupture.  Patient also reported feeling short of breath with ambulation in the ER. Her lungs sound quite clear, but while walking even on her baseline 2 L she desaturated to 84% and became quite dyspneic. I will give her DuoNeb here. Because of the noted hypoxemia on her baseline saturation, discussed with the patient and her husband and we will  admit her for further workup. She has had a previous admission where she had hypoxia, but was discharged, and she may have COPD but a clear defining diagnosis to me is not fully realize. Discussed with the hospitalist service, and they plan to admit and obtain CT imaging to exclude pulmonary embolism and further workup. ____________________________________________   FINAL CLINICAL IMPRESSION(S) / ED DIAGNOSES  Final diagnoses:  Hypoxia  Cellulitis of left lower extremity without foot      NEW MEDICATIONS STARTED DURING THIS VISIT:  New Prescriptions   No medications on file     Note:  This document was prepared using Dragon voice recognition software and may include unintentional dictation errors.     Delman Kitten, MD 09/05/16 [phone]

## 2016-09-05 NOTE — Progress Notes (Signed)
Patient using home cpap unit. No distress noted. Tolerating well

## 2016-09-06 ENCOUNTER — Inpatient Hospital Stay: Payer: PPO

## 2016-09-06 LAB — BASIC METABOLIC PANEL
Anion gap: 6 (ref 5–15)
BUN: 14 mg/dL (ref 6–20)
CALCIUM: 8.6 mg/dL — AB (ref 8.9–10.3)
CHLORIDE: 103 mmol/L (ref 101–111)
CO2: 26 mmol/L (ref 22–32)
CREATININE: 0.89 mg/dL (ref 0.44–1.00)
GFR calc Af Amer: >60 mL/min (ref >60)
GFR calc non Af Amer: >60 mL/min (ref >60)
GLUCOSE: 185 mg/dL — AB (ref 65–99)
Potassium: 5.1 mmol/L (ref 3.5–5.1)
Sodium: 135 mmol/L (ref 135–145)

## 2016-09-06 LAB — GLUCOSE, CAPILLARY
Glucose-Capillary: 190 mg/dL — ABNORMAL HIGH (ref 65–99)
Glucose-Capillary: 114 mg/dL — ABNORMAL HIGH (ref 65–99)
Glucose-Capillary: 144 mg/dL — ABNORMAL HIGH (ref 65–99)
Glucose-Capillary: 249 mg/dL — ABNORMAL HIGH (ref 65–99)

## 2016-09-06 LAB — CBC
HCT: 37.5 % (ref 35.0–47.0)
Hemoglobin: 12.4 g/dL (ref 12.0–16.0)
MCH: 27.4 pg (ref 26.0–34.0)
MCHC: 33 g/dL (ref 32.0–36.0)
MCV: 83 fL (ref 80.0–100.0)
PLATELETS: 185 10*3/uL (ref 150–440)
RBC: 4.51 MIL/uL (ref 3.80–5.20)
RDW: 19.2 % — AB (ref 11.5–14.5)
WBC: 10.7 10*3/uL (ref 3.6–11.0)

## 2016-09-06 MED ORDER — SODIUM CHLORIDE 0.9 % IV SOLN
1.0000 g | Freq: Three times a day (TID) | INTRAVENOUS | Status: DC
  Administered 2016-09-06 – 2016-09-07 (×3): 1 g via INTRAVENOUS
  Filled 2016-09-06 (×7): qty 1

## 2016-09-06 MED ORDER — VANCOMYCIN HCL IN DEXTROSE 1-5 GM/200ML-% IV SOLN
1000.0000 mg | Freq: Two times a day (BID) | INTRAVENOUS | Status: DC
  Administered 2016-09-06 – 2016-09-09 (×6): 1000 mg via INTRAVENOUS
  Filled 2016-09-06 (×7): qty 200

## 2016-09-06 MED ORDER — GADOBENATE DIMEGLUMINE 529 MG/ML IV SOLN
20.0000 mL | Freq: Once | INTRAVENOUS | Status: AC | PRN
  Administered 2016-09-06: 20 mL via INTRAVENOUS

## 2016-09-06 MED ORDER — PREDNISONE 50 MG PO TABS
50.0000 mg | ORAL_TABLET | Freq: Every day | ORAL | Status: DC
  Administered 2016-09-06 – 2016-09-08 (×2): 50 mg via ORAL
  Filled 2016-09-06 (×2): qty 1

## 2016-09-06 MED ORDER — NYSTATIN 100000 UNIT/ML MT SUSP
5.0000 mL | Freq: Four times a day (QID) | OROMUCOSAL | Status: DC
  Administered 2016-09-06 – 2016-09-09 (×9): 500000 [IU] via ORAL
  Filled 2016-09-06 (×10): qty 5

## 2016-09-06 MED ORDER — VANCOMYCIN HCL IN DEXTROSE 1-5 GM/200ML-% IV SOLN
1000.0000 mg | Freq: Two times a day (BID) | INTRAVENOUS | Status: DC
  Filled 2016-09-06 (×3): qty 200

## 2016-09-06 MED ORDER — ONDANSETRON HCL 4 MG/2ML IJ SOLN
4.0000 mg | Freq: Four times a day (QID) | INTRAMUSCULAR | Status: DC | PRN
Start: 2016-09-06 — End: 2016-09-09
  Administered 2016-09-06: 4 mg via INTRAVENOUS
  Filled 2016-09-06: qty 2

## 2016-09-06 NOTE — Progress Notes (Addendum)
[facility] NAME: [name]    MR#:  [id number]  DATE OF BIRTH:  [birth year]  SUBJECTIVE:  CHIEF COMPLAINT:   Chief Complaint  Patient presents with  . Arm Pain  . Weakness  . Leg Pain   Still has pain left leg and foot. Some wheezing and cough. Feels very weak.  REVIEW OF SYSTEMS:    Review of Systems  Constitutional: Positive for malaise/fatigue. Negative for chills and fever.  HENT: Negative for sore throat.   Eyes: Negative for blurred vision, double vision and pain.  Respiratory: Negative for cough, hemoptysis, shortness of breath and wheezing.   Cardiovascular: Negative for chest pain, palpitations, orthopnea and leg swelling.  Gastrointestinal: Negative for abdominal pain, constipation, diarrhea, heartburn, nausea and vomiting.  Genitourinary: Negative for dysuria and hematuria.  Musculoskeletal: Positive for joint pain. Negative for back pain.  Skin: Negative for rash.  Neurological: Positive for weakness. Negative for sensory change, speech change, focal weakness and headaches.  Endo/Heme/Allergies: Does not bruise/bleed easily.  Psychiatric/Behavioral: Negative for depression. The patient is not nervous/anxious.     DRUG ALLERGIES:   Allergies  Allergen Reactions  . Penicillins Hives, Shortness Of Breath and Swelling    Facial swelling Has patient had a PCN reaction causing immediate rash, facial/tongue/throat swelling, SOB or lightheadedness with hypotension: Yes Has patient had a PCN reaction causing severe rash involving mucus membranes or skin necrosis: Yes Has patient had a PCN reaction that required hospitalization No Has patient had a PCN reaction occurring within the last 10 years: No If all of the above answers are "NO", then may proceed with Cephalosporin use.   . Aspirin Hives  . Terramycin [Oxytetracycline] Hives    VITALS:  Blood pressure (!) 162/73, pulse 78, temperature 97.7 F (36.5 C),  temperature source Oral, resp. rate 18, height 5\' 5"  (1.651 m), weight 119.7 kg (264 lb), SpO2 (!) 84 %.  PHYSICAL EXAMINATION:   Physical Exam  GENERAL:  66 y.o.-year-old patient lying in the bed with no acute distress.  EYES: Pupils equal, round, reactive to light and accommodation. No scleral icterus. Extraocular muscles intact.  HEENT: Head atraumatic, normocephalic. Oropharynx and nasopharynx clear.  NECK:  Supple, no jugular venous distention. No thyroid enlargement, no tenderness.  LUNGS: Bilateral wheezing.  CARDIOVASCULAR: S1, S2 normal. No murmurs, rubs, or gallops.  ABDOMEN: Soft, nontender, nondistended. Bowel sounds present. No organomegaly or mass.  EXTREMITIES: Left leg redness extending from foot to mid leg. Swelling and fluctuance in the posterior ankle. NEUROLOGIC: Cranial nerves II through XII are intact. No focal Motor or sensory deficits b/l.   PSYCHIATRIC: The patient is alert and oriented x 3.  SKIN: No obvious rash, lesion, or ulcer.   LABORATORY PANEL:   CBC  Recent Labs Lab 09/06/16 0424  WBC 10.7  HGB 12.4  HCT 37.5  PLT 185   ------------------------------------------------------------------------------------------------------------------ Chemistries   Recent Labs Lab 09/06/16 0424  NA 135  K 5.1  CL 103  CO2 26  GLUCOSE 185*  BUN 14  CREATININE 0.89  CALCIUM 8.6*   ------------------------------------------------------------------------------------------------------------------  Cardiac Enzymes  Recent Labs Lab 09/05/16 1219  TROPONINI <0.03   ------------------------------------------------------------------------------------------------------------------  RADIOLOGY:  Dg Chest 2 View  Result Date: 09/05/2016 CLINICAL DATA:  generalized weakness, right arm pain, and left leg/ankle pain and redness. Pt states that her symptoms started last night. SOB x 1 mo. Pt states she has ulnar surgery to right arm on 08/06/2016 and has been  SOB since. EXAM: CHEST  2 VIEW COMPARISON:  08/06/2016 FINDINGS: Heart size is normal. Shallow lung inflation. Elevation of the right hemidiaphragm is stable. There is mild perihilar peribronchial thickening. There are no focal consolidations or pleural effusions. No pulmonary edema. Status post right reverse shoulder arthroplasty. IMPRESSION: 1. Bronchitic changes. 2.  No evidence for acute  abnormality. Electronically Signed   By: Nolon Nations M.D.   On: 09/05/2016 12:51   Ct Angio Chest Pe W Or Wo Contrast  Result Date: 09/05/2016 CLINICAL DATA:  Hypoxia. EXAM: CT ANGIOGRAPHY CHEST WITH CONTRAST TECHNIQUE: Multidetector CT imaging of the chest was performed using the standard protocol during bolus administration of intravenous contrast. Multiplanar CT image reconstructions and MIPs were obtained to evaluate the vascular anatomy. CONTRAST:  75 cc Isovue 370 intravenously. COMPARISON:  Chest radiograph 09/05/2016 FINDINGS: Cardiovascular: Satisfactory opacification of the pulmonary arteries to the segmental level. No evidence of pulmonary embolism. Mildly enlarged heart. No pericardial effusion. Mitral valve annular calcifications. Mediastinum/Nodes: No enlarged mediastinal, hilar, or axillary lymph nodes. Trachea and esophagus demonstrate no significant findings. Thyroid not visualized. Lungs/Pleura: Low lung volumes. Secondary crowding of the interstitial markings. Right lower lobe atelectasis. Upper Abdomen: Postcholecystectomy at and bariatric surgery. 1.2 cm probable left adrenal adenoma. Otherwise normal. Musculoskeletal: No chest wall abnormality. No acute or significant osseous findings. Findings compatible with diffuse idiopathic skeletal hyperostosis of the thoracic spine Review of the MIP images confirms the above findings. IMPRESSION: No evidence of pulmonary embolus. Mildly enlarged heart with mitral valve annular calcifications. Low lung volumes.  Right lower lobe atelectasis. Electronically  Signed   By: Fidela Salisbury M.D.   On: 09/05/2016 15:04   US Venous Img Lower Unilateral Left  Result Date: 09/05/2016 CLINICAL DATA:  Left lower extremity pain and erythema EXAM: LEFT LOWER EXTREMITY VENOUS DOPPLER ULTRASOUND TECHNIQUE: Gray-scale sonography with graded compression, as well as color Doppler and duplex ultrasound were performed to evaluate the lower extremity deep venous systems from the level of the common femoral vein and including the common femoral, femoral, profunda femoral, popliteal and calf veins including the posterior tibial, peroneal and gastrocnemius veins when visible. The superficial great saphenous vein was also interrogated. Spectral Doppler was utilized to evaluate flow at rest and with distal augmentation maneuvers in the common femoral, femoral and popliteal veins. COMPARISON:  06/22/2008 left lower extremity venous Doppler scan. FINDINGS: Contralateral Common Femoral Vein: Respiratory phasicity is normal and symmetric with the symptomatic side. No evidence of thrombus. Normal compressibility. Common Femoral Vein: No evidence of thrombus. Normal compressibility, respiratory phasicity and response to augmentation. Saphenofemoral Junction: No evidence of thrombus. Normal compressibility and flow on color Doppler imaging. Profunda Femoral Vein: No evidence of thrombus. Normal compressibility and flow on color Doppler imaging. Femoral Vein: No evidence of thrombus. Normal compressibility, respiratory phasicity and response to augmentation. Popliteal Vein: No evidence of thrombus. Normal compressibility, respiratory phasicity and response to augmentation. Calf Veins: No evidence of thrombus. Normal compressibility and flow on color Doppler imaging. Superficial Great Saphenous Vein: No evidence of thrombus. Normal compressibility and flow on color Doppler imaging. Venous Reflux:  None. Other Findings:  None. IMPRESSION: No evidence of DVT within the left lower extremity.  Electronically Signed   By: Ilona Sorrel M.D.   On: 09/05/2016 12:03     ASSESSMENT AND PLAN:   *Assessment/Plan Present on Admission:  . Cellulitis LLE with possible abscess posterior ankle area - vancomycin. Add meropenem - Discussed with Dr. [name] regarding InD. He is ordering an  MRI.  . Anxiety and depression -History of.  Marland Kitchen Apnea, sleep -CPAP QHS  . Acute Asthma exacerbation - prednisone - Nebulizers -Wean O2 as tolerated  . Chronic pain syndrome -Stable, home medications resumed  . Diabetic peripheral neuropathy associated with type 2 diabetes mellitus (HCC) -ADA diet, sliding-scale insulin -Home medications resumed  . Essential hypertension -Stable, home medications resumed -PRN Blood pressure medication  . Hypothyroidism, adult -Stable, home medications resumed  . Paroxysmal atrial fibrillation ([place]); CHA2DS2-Vasc Score 5.  -On Pradaxa  . Peripheral vascular disease due to secondary diabetes mellitus ([place]) -aware  All the records are reviewed and case discussed with Care Management/Social Worker Management plans discussed with the patient, family and they are in agreement.  CODE STATUS: FULL CODE  DVT Prophylaxis: SCDs  TOTAL TIME TAKING CARE OF THIS PATIENT: 35 minutes.   POSSIBLE D/C IN 1-2 DAYS, DEPENDING ON CLINICAL CONDITION.  Hillary Bow R M.D on 09/06/2016 at 10:53 AM  Between 7am to 6pm - Pager - [phone]  After 6pm go to [email or web address] - password [email or web address] [facility]  Office  [phone]  CC: Primary care physician; [name], DO  Note: This dictation was prepared with Dragon dictation along with smaller phrase technology. Any transcriptional errors that result from this process are unintentional.

## 2016-09-06 NOTE — Progress Notes (Signed)
Pharmacy Antibiotic Note  [name] is a 66 y.o. female admitted on 09/05/2016 with cellulitis of LLE with possible abscess to posterior ankle.  Pharmacy was consulted for vancomycin 6/17, now consulted for meropenem. Patient received vancomycin 1500mg  X 1 dose in ED on 6/17.   Patient does report PCN allergy that includes hives, SOB, and swelling. MD is aware.   Plan: NEW: DW: 78Kg    Ke: 0.070   T1/2: 9.9   Vd: 57  Will increase  Vancomycin to  1g IV every 12 hours due to abscess. Calculated trough at Css is 15. Trough ordered prior to 5th dose. Expect some accumulation. Monitor renal function and adjust dose as needed.   Will start meropenem 1g IV every 8 hours. Will discuss with nurse patients PMH of allergy to PCN and S/Sx to look out for during administration.   Height: 5\' 5"  (165.1 cm) Weight: 264 lb (119.7 kg) IBW/kg (Calculated) : 57  Temp (24hrs), Avg:98.6 F (37 C), Min:97.7 F (36.5 C), Max:99.6 F (37.6 C)   Recent Labs Lab 09/05/16 0933 09/06/16 0424  WBC 10.8 10.7  CREATININE 1.01* 0.89    Estimated Creatinine Clearance: 79.5 mL/min (by C-G formula based on SCr of 0.89 mg/dL).    Allergies  Allergen Reactions  . Penicillins Hives, Shortness Of Breath and Swelling    Facial swelling Has patient had a PCN reaction causing immediate rash, facial/tongue/throat swelling, SOB or lightheadedness with hypotension: Yes Has patient had a PCN reaction causing severe rash involving mucus membranes or skin necrosis: Yes Has patient had a PCN reaction that required hospitalization No Has patient had a PCN reaction occurring within the last 10 years: No If all of the above answers are "NO", then may proceed with Cephalosporin use.   . Aspirin Hives  . Terramycin [Oxytetracycline] Hives    Antimicrobials this admission: 6/16 vancomycin >>  6/17 meropenem >>  Dose adjustments this admission:   Microbiology results:  Thank you for allowing pharmacy to be a  part of this patient's care.  Pernell Dupre, PharmD, BCPS Clinical Pharmacist 09/06/2016 11:16 AM

## 2016-09-06 NOTE — Consult Note (Signed)
Patient Demographics  [name], is a 66 y.o. female   MRN: [id number]   DOB - 03/21/1951  Admit Date - 09/05/2016    Outpatient Primary MD for the patient is [name], DO  Consult requested in the Hospital by Hillary Bow, MD, On 09/06/2016    Reason for consult cellulitis to the left lower extremity concentrated along the Achilles tendon posterior ankle   With History of -  Past Medical History:  Diagnosis Date  . Anxiety and depression   . Arthritis   . Asthma   . Cystocele   . Depression   . Diabetes ([place])   . DVT (deep venous thrombosis) (HCC)    Righ calf  . GERD (gastroesophageal reflux disease)   . Heart murmur    Echocardiogram June 2015: Mild MR (possible vegetation seen on TTE, not seen on TEE), normal LV size with moderate concentric LVH. Normal function EF 60-65%. Normal diastolic function. Mild LA dilation.  Marland Kitchen History of IBS   . History of methicillin resistant staphylococcus aureus (MRSA) 2008  . Hypothyroidism   . Insomnia   . Kidney stone    kidney stones with lithotripsy  . Neuropathy involving both lower extremities   . Obesity, Class II, BMI 35-39.9, with comorbidity   . Paroxysmal atrial fibrillation ([place]) 2007   In June 2015, Cardiac Event Monitor: Mostly SR/sinus arrhythmia with PVCs that are frequent. Short bursts of A. fib lasting several minutes;; CHA2DS2-VASc Score = 5 (age, Female, PVD, DM, HTN)  . Peripheral vascular disease ([place])   . Sleep apnea    use C-PAP  . Venous stasis dermatitis of both lower extremities       Past Surgical History:  Procedure Laterality Date  . ANKLE SURGERY Right   . APPLICATION OF WOUND VAC Left 06/27/2015   Procedure: APPLICATION OF WOUND VAC ( POSSIBLE ) ;  Surgeon: [name], MD;  Location: [facility] ORS;  Service: Vascular;   Laterality: Left;  . arm surgery     right    . CHOLECYSTECTOMY    . HERNIA REPAIR     umbilical  . HIATAL HERNIA REPAIR    . I&D EXTREMITY Left 06/27/2015   Procedure: IRRIGATION AND DEBRIDEMENT EXTREMITY            ( CALF HEMATOMA ) POSSIBLE WOUND VAC;  Surgeon: [name], MD;  Location: [facility] ORS;  Service: Vascular;  Laterality: Left;  . JOINT REPLACEMENT     left knee replacement  . KIDNEY SURGERY Right    kidney stones  . LITHOTRIPSY    . NM [facility] HX)  February 2017   Likely breast attenuation. LOW RISK study. Normal EF 55-60%.  . removal of left hematoma Left    leg  . REVERSE SHOULDER ARTHROPLASTY Right 10/08/2015   Procedure: REVERSE SHOULDER ARTHROPLASTY;  Surgeon: [name], MD;  Location: [facility] ORS;  Service: Orthopedics;  Laterality: Right;  . SLEEVE GASTROPLASTY    . TONSILLECTOMY    . TOTAL KNEE ARTHROPLASTY    . TRANSESOPHAGEAL ECHOCARDIOGRAM  08/08/2013   Mild LVH, EF 60-65%. Moderate LA dilation and mild RA dilation. Mild MR with no  evidence of stenosis and no evidence of endocarditis. A false chordae is noted.  . TRANSTHORACIC ECHOCARDIOGRAM  08/03/2013   Mild-Moderate concentric LVH, EF 60-65%. Normal diastolic function. Mild LA dilation. Mild MR with possible vegetation  - not confirmed on TEE   . ULNAR NERVE TRANSPOSITION Right 08/06/2016   Procedure: ULNAR NERVE DECOMPRESSION/TRANSPOSITION;  Surgeon: [name], MD;  Location: [facility] ORS;  Service: Orthopedics;  Laterality: Right;  Marland Kitchen VAGINAL HYSTERECTOMY      in for   Chief Complaint  Patient presents with  . Arm Pain  . Weakness  . Leg Pain     HPI  [name]  is a 66 y.o. female, Patient states she was working in the yard on Friday and may have developed some rubbing or blister formation on the back of her heel but has no open wound. She states the pain increased over the course of the 24 hours and came to emergency room. She was admitted through the ER yesterday. She has been on  vancomycin since yesterday. Has a history of MRSA    Social History Social History  Substance Use Topics  . Smoking status: Never Smoker  . Smokeless tobacco: Never Used  . Alcohol use No     Family History Family History  Problem Relation Age of Onset  . Skin cancer Father   . Diabetes Father   . Hypertension Father   . Peripheral vascular disease Father   . Cancer Father   . Cerebral aneurysm Father   . Alcohol abuse Father   . Varicose Veins Mother   . Kidney disease Mother   . Arthritis Mother   . Mental illness Sister   . Cancer Maternal Aunt        breast  . Breast cancer Maternal Aunt   . Arthritis Maternal Grandmother   . Hypertension Maternal Grandmother   . Diabetes Maternal Grandmother   . Arthritis Maternal Grandfather   . Heart disease Maternal Grandfather   . Hypertension Maternal Grandfather   . Arthritis Paternal Grandmother   . Hypertension Paternal Grandmother   . Diabetes Paternal Grandmother   . Arthritis Paternal Grandfather   . Hypertension Paternal Grandfather      Prior to Admission medications   Medication Sig Start Date End Date Taking? Authorizing Provider  acetaminophen (TYLENOL) 500 MG tablet Take 1,000 mg by mouth every 8 (eight) hours as needed for mild pain.   Yes [provider]  albuterol (PROVENTIL HFA;VENTOLIN HFA) 108 (90 Base) MCG/ACT inhaler Inhale 1-2 puffs into the lungs every 4 (four) hours as needed for wheezing or shortness of breath. Patient taking differently: Inhale 2 puffs into the lungs every 4 (four) hours as needed for wheezing or shortness of breath.  03/29/15  Yes Bobetta Lime, MD  albuterol (PROVENTIL) (2.5 MG/3ML) 0.083% nebulizer solution Take 3 mLs (2.5 mg total) by nebulization every 6 (six) hours as needed for wheezing or shortness of breath. 02/21/15  Yes Ashok Norris, MD  ALPRAZolam Duanne Moron) 0.5 MG tablet TAKE ONE TABLET TWICE DAILY AS NEEDED 07/24/16  Yes Cook, Jayce G, DO  buPROPion  (WELLBUTRIN XL) 300 MG 24 hr tablet TAKE ONE TABLET BY MOUTH EVERY DAY 07/22/16  Yes Cook, Jayce G, DO  dabigatran (PRADAXA) 150 MG CAPS capsule TAKE 1 CAPSULE BY MOUTH TWICE DAILY 04/24/16  Yes Cook, Jayce G, DO  Dexlansoprazole 30 MG capsule Take 1 capsule (30 mg total) by mouth daily. 06/30/16  Yes Cook, Jayce G, DO  DULoxetine (CYMBALTA)  60 MG capsule Take 1 capsule (60 mg total) by mouth daily. 09/13/16 12/12/16 Yes King, Diona Foley, NP  ESTRACE VAGINAL 0.1 MG/GM vaginal cream INSERT 1 GRAM VAGINALLY TWICE A WEEK Patient taking differently: INSERT 1 GRAM VAGINALLY TWICE A WEEK (Tuesday and Thursday) 09/18/15  Yes Thersa Salt G, DO  ferrous sulfate 325 (65 FE) MG tablet Take 1 tablet (325 mg total) by mouth 2 (two) times daily. 07/03/16  Yes Cook, Jayce G, DO  fluticasone-salmeterol (ADVAIR HFA) 542-70 MCG/ACT inhaler Inhale 2 puffs into the lungs 2 (two) times daily. 10/13/15  Yes Gouru, Illene Silver, MD  gabapentin (NEURONTIN) 800 MG tablet Take 1 tablet (800 mg total) by mouth 4 (four) times daily. Patient taking differently: Take 600 mg by mouth 4 (four) times daily.  09/13/16 12/12/16 Yes Vevelyn Francois, NP  HYDROcodone-acetaminophen (NORCO/VICODIN) 5-325 MG tablet Take 1 tablet by mouth every 8 (eight) hours as needed for severe pain. 09/13/16 10/13/16 Yes Vevelyn Francois, NP  hydrocortisone cream 1 % Apply 1 application topically daily as needed for itching.   Yes [provider]  levocetirizine (XYZAL) 5 MG tablet Take 1 tablet (5 mg total) by mouth every evening. Patient taking differently: Take 5 mg by mouth daily as needed.  07/07/16  Yes Cook, Jayce G, DO  levothyroxine (SYNTHROID, LEVOTHROID) 150 MCG tablet TAKE ONE TABLET BY MOUTH EVERY MORNING 07/22/16  Yes Cook, Jayce G, DO  meloxicam (MOBIC) 15 MG tablet Take 15 mg by mouth daily.  08/22/15  Yes [provider]  metFORMIN (GLUCOPHAGE) 1000 MG tablet TAKE ONE TABLET BY MOUTH TWICE DAILY Patient taking differently: TAKE ONE TABLET IN THE  MORNING AND HALF TABLET AT NIGHT 05/21/16  Yes Crecencio Mc, MD  metoprolol tartrate (LOPRESSOR) 25 MG tablet TAKE ONE TABLET TWICE DAILY 07/01/16  Yes Cook, Jayce G, DO  mirabegron ER (MYRBETRIQ) 25 MG TB24 tablet Take 1 tablet (25 mg total) by mouth daily. 12/05/15  Yes McGowan, [name] A, PA-C  montelukast (SINGULAIR) 10 MG tablet TAKE ONE TABLET BY MOUTH EVERY DAY 04/24/16  Yes Cook, Jayce G, DO  oxybutynin (DITROPAN-XL) 10 MG 24 hr tablet Take 1 tablet (10 mg total) by mouth daily. Patient taking differently: Take 10 mg by mouth at bedtime.  12/05/15  Yes McGowan, [name] A, PA-C  quinapril (ACCUPRIL) 20 MG tablet TAKE ONE TABLET EVERY DAY 05/26/16  Yes Cook, Jayce G, DO  HYDROcodone-acetaminophen (NORCO/VICODIN) 5-325 MG tablet Take 1 tablet by mouth every 8 (eight) hours as needed for severe pain. Patient not taking: Reported on 09/05/2016 10/13/16 11/12/16  Vevelyn Francois, NP  HYDROcodone-acetaminophen (NORCO/VICODIN) 5-325 MG tablet Take 1 tablet by mouth every 8 (eight) hours as needed for severe pain. Patient not taking: Reported on 09/05/2016 11/12/16 12/12/16  Vevelyn Francois, NP  ipratropium (ATROVENT) 0.06 % nasal spray Place 2 sprays into both nostrils 4 (four) times daily. Patient not taking: Reported on 09/05/2016 06/09/16   [name], DO    Anti-infectives    Start     Dose/Rate Route Frequency Ordered Stop   09/06/16 1400  meropenem (MERREM) 1 g in sodium chloride 0.9 % 100 mL IVPB     1 g 200 mL/hr over 30 Minutes Intravenous Every 8 hours 09/06/16 1103     09/06/16 0200  vancomycin (VANCOCIN) IVPB 750 mg/150 ml premix     750 mg 150 mL/hr over 60 Minutes Intravenous Every 12 hours 09/05/16 1621     09/05/16 1530  vancomycin (  VANCOCIN) 1,500 mg in sodium chloride 0.9 % 500 mL IVPB     1,500 mg 250 mL/hr over 120 Minutes Intravenous  Once 09/05/16 1518 09/05/16 1741   09/05/16 1045  clindamycin (CLEOCIN) capsule 300 mg     300 mg Oral  Once 09/05/16 1033 09/05/16 1044       Scheduled Meds: . buPROPion  300 mg Oral Daily  . DULoxetine  60 mg Oral Daily  . ferrous sulfate  325 mg Oral BID  . gabapentin  800 mg Oral QID  . insulin aspart  0-15 Units Subcutaneous TID WC  . insulin aspart  0-5 Units Subcutaneous QHS  . levothyroxine  150 mcg Oral QAC breakfast  . metoprolol tartrate  25 mg Oral BID  . mirabegron ER  25 mg Oral Daily  . mometasone-formoterol  2 puff Inhalation BID  . montelukast  10 mg Oral QHS  . nystatin  5 mL Oral QID  . oxybutynin  10 mg Oral QHS  . pantoprazole  40 mg Oral Daily  . predniSONE  50 mg Oral Q breakfast  . quinapril  20 mg Oral Daily   Continuous Infusions: . meropenem (MERREM) IV    . vancomycin 750 mg (09/06/16 0157)   PRN Meds:.ALPRAZolam, HYDROcodone-acetaminophen, ipratropium-albuterol, loratadine, senna-docusate  Allergies  Allergen Reactions  . Penicillins Hives, Shortness Of Breath and Swelling    Facial swelling Has patient had a PCN reaction causing immediate rash, facial/tongue/throat swelling, SOB or lightheadedness with hypotension: Yes Has patient had a PCN reaction causing severe rash involving mucus membranes or skin necrosis: Yes Has patient had a PCN reaction that required hospitalization No Has patient had a PCN reaction occurring within the last 10 years: No If all of the above answers are "NO", then may proceed with Cephalosporin use.   . Aspirin Hives  . Terramycin [Oxytetracycline] Hives    Physical Exam  Vitals  Blood pressure (!) 162/73, pulse 78, temperature 97.7 F (36.5 C), temperature source Oral, resp. rate 18, height 5\' 5"  (1.651 m), weight 119.7 kg (264 lb), SpO2 (!) 84 %.  Lower Extremity exam:  Vascular:We'll difficult to palpate on the left due to swelling. Lightly palpable to the right.  Dermatological: Patient has no open wounds to the posterior Achilles on the left there is extensive erythema redness and some fluctuance around the region as well. Very tender to  palpation.  Neurological: History of peripheral neuropathy to both lower extremities secondary to diabetes  Ortho: Achilles tendon is intact and functional she can dorsiflex and plantarflex free but it is painful do that on the left.  Data Review  CBC  Recent Labs Lab 09/05/16 0933 09/06/16 0424  WBC 10.8 10.7  HGB 12.0 12.4  HCT 35.9 37.5  PLT 220 185  MCV 82.7 83.0  MCH 27.7 27.4  MCHC 33.5 33.0  RDW 19.3* 19.2*   ------------------------------------------------------------------------------------------------------------------  Chemistries   Recent Labs Lab 09/05/16 0933 09/06/16 0424  NA 134* 135  K 4.8 5.1  CL 100* 103  CO2 26 26  GLUCOSE 190* 185*  BUN 22* 14  CREATININE 1.01* 0.89  CALCIUM 9.0 8.6*   -------- Assessment & Plan: Does seem to be some tautness and potential fluid either around the Achilles tendon or possibly in the Achilles tendon sheath at this timeframe. Significant cellulitis to the left lower extremity at this timeframe. Plan: Patient toes are eaten today and also is on Pradaxa for anticoagulation. Also likely get more information about the fluid levels before  we proceed with any type of I&D. She may need an I&D to overcome the infection but I need to see if it's involved within the tendon sheath or if it is external to the Achilles tendon tendon sheath itself. I'll go and order an MRI stat today showing get that information and see if she starts respond to her IV antibiotics and the side if she needs an incision and drainage once we get information on the MRI. We'll also go and stop her anticoagulant this timeframe.  Active Problems:   Paroxysmal atrial fibrillation (HCC); CHA2DS2-Vasc Score 5. On Pradaxa   Essential hypertension   Peripheral vascular disease due to secondary diabetes mellitus (HCC)   Apnea, sleep   Diabetic peripheral neuropathy associated with type 2 diabetes mellitus (HCC)   Asthma, moderate persistent, well-controlled    Hypothyroidism, adult   Anxiety and depression   Chronic prescription benzodiazepine use   Chronic pain syndrome   Long term current use of opiate analgesic   Cellulitis   Family Communication: Plan discussed with patient and family  Vinessa Macconnell G M.D on 09/06/2016 at 11:06 AM  Thank you for the consult, we will follow the patient with you in the Hospital.

## 2016-09-07 ENCOUNTER — Inpatient Hospital Stay: Payer: PPO | Admitting: Anesthesiology

## 2016-09-07 ENCOUNTER — Encounter
Admission: EM | Disposition: A | Discharge status: Home Health | Payer: Self-pay | Source: Home / Self Care | Attending: Internal Medicine

## 2016-09-07 ENCOUNTER — Encounter: Payer: Self-pay | Admitting: Anesthesiology

## 2016-09-07 HISTORY — PX: IRRIGATION AND DEBRIDEMENT ABSCESS: SHX5252

## 2016-09-07 LAB — GLUCOSE, CAPILLARY
GLUCOSE-CAPILLARY: 180 mg/dL — AB (ref 65–99)
Glucose-Capillary: 139 mg/dL — ABNORMAL HIGH (ref 65–99)
Glucose-Capillary: 287 mg/dL — ABNORMAL HIGH (ref 65–99)
Glucose-Capillary: 228 mg/dL — ABNORMAL HIGH (ref 65–99)

## 2016-09-07 LAB — MRSA PCR SCREENING: MRSA by PCR: NEGATIVE

## 2016-09-07 SURGERY — IRRIGATION AND DEBRIDEMENT ABSCESS
Anesthesia: Regional | Laterality: Left | Wound class: Contaminated

## 2016-09-07 MED ORDER — MORPHINE SULFATE (PF) 2 MG/ML IV SOLN
2.0000 mg | INTRAVENOUS | Status: DC | PRN
  Administered 2016-09-07 – 2016-09-09 (×4): 2 mg via INTRAVENOUS
  Filled 2016-09-07 (×4): qty 1

## 2016-09-07 MED ORDER — SODIUM CHLORIDE 0.9 % IV SOLN
INTRAVENOUS | Status: DC | PRN
  Administered 2016-09-07: 12:00:00 via INTRAVENOUS

## 2016-09-07 MED ORDER — LIDOCAINE HCL (CARDIAC) 20 MG/ML IV SOLN
INTRAVENOUS | Status: DC | PRN
  Administered 2016-09-07: 100 mg via INTRAVENOUS

## 2016-09-07 MED ORDER — ONDANSETRON HCL 4 MG/2ML IJ SOLN
INTRAMUSCULAR | Status: AC
  Filled 2016-09-07: qty 2

## 2016-09-07 MED ORDER — DEXAMETHASONE SODIUM PHOSPHATE 10 MG/ML IJ SOLN
INTRAMUSCULAR | Status: AC
  Filled 2016-09-07: qty 1

## 2016-09-07 MED ORDER — ROPIVACAINE HCL 5 MG/ML IJ SOLN
INTRAMUSCULAR | Status: AC
  Filled 2016-09-07: qty 30

## 2016-09-07 MED ORDER — LIDOCAINE HCL (PF) 1 % IJ SOLN
INTRAMUSCULAR | Status: AC
  Filled 2016-09-07: qty 5

## 2016-09-07 MED ORDER — FENTANYL CITRATE (PF) 100 MCG/2ML IJ SOLN: 25.0000 ug | INTRAMUSCULAR | Status: DC | PRN

## 2016-09-07 MED ORDER — PROPOFOL 10 MG/ML IV BOLUS
INTRAVENOUS | Status: AC
  Filled 2016-09-07: qty 20

## 2016-09-07 MED ORDER — BUPIVACAINE HCL (PF) 0.5 % IJ SOLN
INTRAMUSCULAR | Status: AC
  Filled 2016-09-07: qty 30

## 2016-09-07 MED ORDER — ONDANSETRON HCL 4 MG/2ML IJ SOLN
INTRAMUSCULAR | Status: DC | PRN
  Administered 2016-09-07: 4 mg via INTRAVENOUS

## 2016-09-07 MED ORDER — LIDOCAINE HCL (PF) 2 % IJ SOLN
INTRAMUSCULAR | Status: AC
  Filled 2016-09-07: qty 2

## 2016-09-07 MED ORDER — ROCURONIUM BROMIDE 100 MG/10ML IV SOLN
INTRAVENOUS | Status: DC | PRN
  Administered 2016-09-07: 30 mg via INTRAVENOUS

## 2016-09-07 MED ORDER — PREMIER PROTEIN SHAKE
11.0000 [oz_av] | ORAL | Status: DC
  Administered 2016-09-08 – 2016-09-09 (×2): 11 [oz_av] via ORAL

## 2016-09-07 MED ORDER — PROPOFOL 10 MG/ML IV BOLUS
INTRAVENOUS | Status: DC | PRN
  Administered 2016-09-07: 50 mg via INTRAVENOUS
  Administered 2016-09-07: 90 mg via INTRAVENOUS

## 2016-09-07 MED ORDER — DEXAMETHASONE SODIUM PHOSPHATE 10 MG/ML IJ SOLN
INTRAMUSCULAR | Status: DC | PRN
  Administered 2016-09-07: 6 mg via INTRAVENOUS

## 2016-09-07 MED ORDER — FENTANYL CITRATE (PF) 100 MCG/2ML IJ SOLN
INTRAMUSCULAR | Status: AC
  Filled 2016-09-07: qty 2

## 2016-09-07 MED ORDER — FENTANYL CITRATE (PF) 100 MCG/2ML IJ SOLN
INTRAMUSCULAR | Status: DC | PRN
  Administered 2016-09-07: 25 ug via INTRAVENOUS
  Administered 2016-09-07: 50 ug via INTRAVENOUS
  Administered 2016-09-07: 25 ug via INTRAVENOUS

## 2016-09-07 MED ORDER — SUGAMMADEX SODIUM 200 MG/2ML IV SOLN
INTRAVENOUS | Status: DC | PRN
  Administered 2016-09-07: 200 mg via INTRAVENOUS

## 2016-09-07 MED ORDER — ONDANSETRON HCL 4 MG/2ML IJ SOLN: 4.0000 mg | Freq: Once | INTRAMUSCULAR | Status: DC | PRN

## 2016-09-07 MED ORDER — DEXTROSE 5 % IV SOLN
1.0000 g | INTRAVENOUS | Status: DC
  Administered 2016-09-07: 1 g via INTRAVENOUS
  Filled 2016-09-07 (×2): qty 10

## 2016-09-07 MED ORDER — LIDOCAINE HCL (PF) 1 % IJ SOLN
INTRAMUSCULAR | Status: AC
  Filled 2016-09-07: qty 30

## 2016-09-07 SURGICAL SUPPLY — 40 items
BAG COUNTER SPONGE EZ (MISCELLANEOUS) ×2 IMPLANT
BAG SPNG 4X4 CLR HAZ (MISCELLANEOUS) ×1
BANDAGE ELASTIC 3 LF NS (GAUZE/BANDAGES/DRESSINGS) ×1 IMPLANT
BANDAGE ELASTIC 4 LF NS (GAUZE/BANDAGES/DRESSINGS) ×2 IMPLANT
BANDAGE STRETCH 3X4.1 STRL (GAUZE/BANDAGES/DRESSINGS) ×2 IMPLANT
BNDG CMPR MED 5X3 ELC HKLP NS (GAUZE/BANDAGES/DRESSINGS)
BNDG CMPR MED 5X4 ELC HKLP NS (GAUZE/BANDAGES/DRESSINGS) ×1
BNDG ESMARK 4X12 TAN STRL LF (GAUZE/BANDAGES/DRESSINGS) ×2 IMPLANT
BNDG GAUZE 4.5X4.1 6PLY STRL (MISCELLANEOUS) ×2 IMPLANT
CANISTER SUCT 1200ML W/VALVE (MISCELLANEOUS) ×2 IMPLANT
CUFF TOURN 18 STER (MISCELLANEOUS) ×2 IMPLANT
CUFF TOURN DUAL PL 12 NO SLV (MISCELLANEOUS) IMPLANT
DRAPE FLUOR MINI C-ARM 54X84 (DRAPES) ×1 IMPLANT
DURAPREP 26ML APPLICATOR (WOUND CARE) ×2 IMPLANT
ELECT REM PT RETURN 9FT ADLT (ELECTROSURGICAL) ×2
ELECTRODE REM PT RTRN 9FT ADLT (ELECTROSURGICAL) ×1 IMPLANT
GAUZE PETRO XEROFOAM 1X8 (MISCELLANEOUS) IMPLANT
GAUZE SPONGE 4X4 12PLY STRL (GAUZE/BANDAGES/DRESSINGS) ×2 IMPLANT
GLOVE BIO SURGEON STRL SZ8 (GLOVE) ×4 IMPLANT
GLOVE INDICATOR 8.0 STRL GRN (GLOVE) ×4 IMPLANT
GOWN STRL REUS W/ TWL LRG LVL3 (GOWN DISPOSABLE) ×1 IMPLANT
GOWN STRL REUS W/TWL LRG LVL3 (GOWN DISPOSABLE) ×2
KIT RM TURNOVER STRD PROC AR (KITS) ×2 IMPLANT
LABEL OR SOLS (LABEL) ×2 IMPLANT
NEEDLE FILTER BLUNT 18X 1/2SAF (NEEDLE) ×1
NEEDLE FILTER BLUNT 18X1 1/2 (NEEDLE) ×1 IMPLANT
NEEDLE HYPO 25X1 1.5 SAFETY (NEEDLE) ×2 IMPLANT
NS IRRIG 500ML POUR BTL (IV SOLUTION) ×2 IMPLANT
PACK EXTREMITY ARMC (MISCELLANEOUS) ×2 IMPLANT
STOCKINETTE STRL 6IN 960660 (GAUZE/BANDAGES/DRESSINGS) ×2 IMPLANT
SUT ETHILON 3-0 FS-10 30 BLK (SUTURE) ×2
SUT ETHILON 4-0 (SUTURE)
SUT ETHILON 4-0 FS2 18XMFL BLK (SUTURE)
SUT VIC AB 3-0 SH 27 (SUTURE)
SUT VIC AB 3-0 SH 27X BRD (SUTURE) IMPLANT
SUT VIC AB 4-0 FS2 27 (SUTURE) ×1 IMPLANT
SUTURE EHLN 3-0 FS-10 30 BLK (SUTURE) ×1 IMPLANT
SUTURE ETHLN 4-0 FS2 18XMF BLK (SUTURE) ×1 IMPLANT
SYR 3ML LL SCALE MARK (SYRINGE) IMPLANT
SYRINGE 10CC LL (SYRINGE) ×3 IMPLANT

## 2016-09-07 NOTE — Progress Notes (Signed)
[place] [facility] [place] NAME: [name]    MR#:  [id number]  DATE OF BIRTH:  1950-06-18  SUBJECTIVE:  CHIEF COMPLAINT:   Chief Complaint  Patient presents with  . Arm Pain  . Weakness  . Leg Pain   Complains of pain left foot. Nothing by mouth for surgery.    REVIEW OF SYSTEMS:    Review of Systems  Constitutional: Positive for malaise/fatigue. Negative for chills and fever.  HENT: Negative for sore throat.   Eyes: Negative for blurred vision, double vision and pain.  Respiratory: Negative for cough, hemoptysis, shortness of breath and wheezing.   Cardiovascular: Negative for chest pain, palpitations, orthopnea and leg swelling.  Gastrointestinal: Negative for abdominal pain, constipation, diarrhea, heartburn, nausea and vomiting.  Genitourinary: Negative for dysuria and hematuria.  Musculoskeletal: Positive for joint pain. Negative for back pain.  Skin: Negative for rash.  Neurological: Positive for weakness. Negative for sensory change, speech change, focal weakness and headaches.  Endo/Heme/Allergies: Does not bruise/bleed easily.  Psychiatric/Behavioral: Negative for depression. The patient is not nervous/anxious.     DRUG ALLERGIES:   Allergies  Allergen Reactions  . Penicillins Hives, Shortness Of Breath and Swelling    Facial swelling Has patient had a PCN reaction causing immediate rash, facial/tongue/throat swelling, SOB or lightheadedness with hypotension: Yes Has patient had a PCN reaction causing severe rash involving mucus membranes or skin necrosis: Yes Has patient had a PCN reaction that required hospitalization No Has patient had a PCN reaction occurring within the last 10 years: No If all of the above answers are "NO", then may proceed with Cephalosporin use.   . Aspirin Hives  . Terramycin [Oxytetracycline] Hives    VITALS:  Blood pressure 125/67, pulse 65, temperature 98.1 F (36.7 C), temperature  source Oral, resp. rate 20, height 5\' 5"  (1.651 m), weight 119.7 kg (264 lb), SpO2 93 %.  PHYSICAL EXAMINATION:   Physical Exam  GENERAL:  66 y.o.-year-old patient lying in the bed with no acute distress.  EYES: Pupils equal, round, reactive to light and accommodation. No scleral icterus. Extraocular muscles intact.  HEENT: Head atraumatic, normocephalic. Oropharynx and nasopharynx clear.  NECK:  Supple, no jugular venous distention. No thyroid enlargement, no tenderness.  LUNGS: Bilateral wheezing.  CARDIOVASCULAR: S1, S2 normal. No murmurs, rubs, or gallops.  ABDOMEN: Soft, nontender, nondistended. Bowel sounds present. No organomegaly or mass.  EXTREMITIES: Left leg redness extending from foot to mid leg. Swelling and fluctuance in the posterior ankle. NEUROLOGIC: Cranial nerves II through XII are intact. No focal Motor or sensory deficits b/l.   PSYCHIATRIC: The patient is alert and oriented x 3.  SKIN: No obvious rash, lesion, or ulcer.   LABORATORY PANEL:   CBC  Recent Labs Lab 09/06/16 0424  WBC 10.7  HGB 12.4  HCT 37.5  PLT 185   ------------------------------------------------------------------------------------------------------------------ Chemistries   Recent Labs Lab 09/06/16 0424  NA 135  K 5.1  CL 103  CO2 26  GLUCOSE 185*  BUN 14  CREATININE 0.89  CALCIUM 8.6*   ------------------------------------------------------------------------------------------------------------------  Cardiac Enzymes  Recent Labs Lab 09/05/16 1219  TROPONINI <0.03   ------------------------------------------------------------------------------------------------------------------  RADIOLOGY:  Dg Chest 2 View  Result Date: 09/05/2016 CLINICAL DATA:  generalized weakness, right arm pain, and left leg/ankle pain and redness. Pt states that her symptoms started last night. SOB x 1 mo. Pt states she has ulnar surgery to right arm on 08/06/2016 and has been SOB since. EXAM:  CHEST  2 VIEW COMPARISON:  08/06/2016 FINDINGS: Heart size is normal. Shallow lung inflation. Elevation of the right hemidiaphragm is stable. There is mild perihilar peribronchial thickening. There are no focal consolidations or pleural effusions. No pulmonary edema. Status post right reverse shoulder arthroplasty. IMPRESSION: 1. Bronchitic changes. 2.  No evidence for acute  abnormality. Electronically Signed   By: Nolon Nations M.D.   On: 09/05/2016 12:51   Ct Angio Chest Pe W Or Wo Contrast  Result Date: 09/05/2016 CLINICAL DATA:  Hypoxia. EXAM: CT ANGIOGRAPHY CHEST WITH CONTRAST TECHNIQUE: Multidetector CT imaging of the chest was performed using the standard protocol during bolus administration of intravenous contrast. Multiplanar CT image reconstructions and MIPs were obtained to evaluate the vascular anatomy. CONTRAST:  75 cc Isovue 370 intravenously. COMPARISON:  Chest radiograph 09/05/2016 FINDINGS: Cardiovascular: Satisfactory opacification of the pulmonary arteries to the segmental level. No evidence of pulmonary embolism. Mildly enlarged heart. No pericardial effusion. Mitral valve annular calcifications. Mediastinum/Nodes: No enlarged mediastinal, hilar, or axillary lymph nodes. Trachea and esophagus demonstrate no significant findings. Thyroid not visualized. Lungs/Pleura: Low lung volumes. Secondary crowding of the interstitial markings. Right lower lobe atelectasis. Upper Abdomen: Postcholecystectomy at and bariatric surgery. 1.2 cm probable left adrenal adenoma. Otherwise normal. Musculoskeletal: No chest wall abnormality. No acute or significant osseous findings. Findings compatible with diffuse idiopathic skeletal hyperostosis of the thoracic spine Review of the MIP images confirms the above findings. IMPRESSION: No evidence of pulmonary embolus. Mildly enlarged heart with mitral valve annular calcifications. Low lung volumes.  Right lower lobe atelectasis. Electronically Signed   By:  Fidela Salisbury M.D.   On: 09/05/2016 15:04   Mr Ankle Left W Wo Contrast  Result Date: 09/06/2016 CLINICAL DATA:  Pain, redness and swelling involving the left ankle area since Friday. EXAM: MRI OF THE LEFT ANKLE WITHOUT AND WITH CONTRAST TECHNIQUE: Multiplanar, multisequence MR imaging of the ankle was performed before and after the administration of intravenous contrast. CONTRAST:  38mL MULTIHANCE GADOBENATE DIMEGLUMINE 529 MG/ML IV SOLN COMPARISON:  None. FINDINGS: Examination is limited by patient motion. TENDONS Peroneal: Intact. Posteromedial: Intact. Anterior: Intact. Achilles: Mild tendinopathy but no tear. Plantar Fascia: Intact. LIGAMENTS Lateral: Intact Medial: Intact CARTILAGE Ankle Joint: Mild degenerative changes but no full-thickness chondral defect or osteochondral lesion. Small joint effusion. Subtalar Joints/Sinus Tarsi: Moderate subtalar joint degenerative changes with subchondral cysts. There is also invagination of vascular tissue into the calcaneus along the posterior margin of the sinus tarsi E. there is a small amount of fluid and edema in the sinus tarsi E but the cervical and interosseous ligaments are grossly intact. Bones: No stress fracture or AVN. Moderate midfoot degenerative changes. No findings to suggest septic arthritis or osteomyelitis. Other: Subcutaneous soft tissue swelling/ edema suggesting cellulitis. Diffuse fatty atrophy of the foot and ankle musculature but no findings for myofasciitis or pyomyositis. IMPRESSION: IMPRESSION 1. Diffuse subcutaneous soft tissue swelling/edema/fluid suggesting cellulitis. No findings for focal soft tissue abscess, pyomyositis, septic arthritis or osteomyelitis. 2. Midfoot and hindfoot degenerative changes and mild ankle joint degenerative changes. No stress fracture or AVN. 3. Intact medial and lateral ankle ligaments and tendons. Electronically Signed   By: Marijo Sanes M.D.   On: 09/06/2016 16:49   US Venous Img Lower Unilateral  Left  Result Date: 09/05/2016 CLINICAL DATA:  Left lower extremity pain and erythema EXAM: LEFT LOWER EXTREMITY VENOUS DOPPLER ULTRASOUND TECHNIQUE: Gray-scale sonography with graded compression, as well as color Doppler and duplex ultrasound were performed to evaluate the lower  extremity deep venous systems from the level of the common femoral vein and including the common femoral, femoral, profunda femoral, popliteal and calf veins including the posterior tibial, peroneal and gastrocnemius veins when visible. The superficial great saphenous vein was also interrogated. Spectral Doppler was utilized to evaluate flow at rest and with distal augmentation maneuvers in the common femoral, femoral and popliteal veins. COMPARISON:  06/22/2008 left lower extremity venous Doppler scan. FINDINGS: Contralateral Common Femoral Vein: Respiratory phasicity is normal and symmetric with the symptomatic side. No evidence of thrombus. Normal compressibility. Common Femoral Vein: No evidence of thrombus. Normal compressibility, respiratory phasicity and response to augmentation. Saphenofemoral Junction: No evidence of thrombus. Normal compressibility and flow on color Doppler imaging. Profunda Femoral Vein: No evidence of thrombus. Normal compressibility and flow on color Doppler imaging. Femoral Vein: No evidence of thrombus. Normal compressibility, respiratory phasicity and response to augmentation. Popliteal Vein: No evidence of thrombus. Normal compressibility, respiratory phasicity and response to augmentation. Calf Veins: No evidence of thrombus. Normal compressibility and flow on color Doppler imaging. Superficial Great Saphenous Vein: No evidence of thrombus. Normal compressibility and flow on color Doppler imaging. Venous Reflux:  None. Other Findings:  None. IMPRESSION: No evidence of DVT within the left lower extremity. Electronically Signed   By: Ilona Sorrel M.D.   On: 09/05/2016 12:03     ASSESSMENT AND PLAN:    *Assessment/Plan Present on Admission:  . Cellulitis LLE with possible abscess posterior ankle area - On vancomycin and meropenem - Consult podiatry. Patient had MRI of the foot and ankle with no fluid collection. - Operating room today to drain superficial fluid collection over the Achilles tendon.  Marland Kitchen Anxiety and depression On home medications  . Apnea, sleep -CPAP QHS  . Acute Asthma exacerbation - improving - prednisone - Nebulizers -Wean O2 as tolerated. Uses oxygen as needed at home  . Chronic pain syndrome -Stable, home medications resumed  . Diabetic peripheral neuropathy associated with type 2 diabetes mellitus (HCC) -ADA diet, sliding-scale insulin -Home medications resumed  . Essential hypertension -Stable, home medications resumed -PRN Blood pressure medication  . Hypothyroidism, adult -Stable, home medications resumed  . Paroxysmal atrial fibrillation ([place]); CHA2DS2-Vasc Score 5.  -On Pradaxa at home. Pradaxa held for surgery. Resume after surgery.  . Peripheral vascular disease due to secondary diabetes mellitus ([place]) -aware  All the records are reviewed and case discussed with Care Management/Social Worker Management plans discussed with the patient, family and they are in agreement.  CODE STATUS: FULL CODE  DVT Prophylaxis: SCDs  TOTAL TIME TAKING CARE OF THIS PATIENT: 35 minutes.   POSSIBLE D/C IN 1-2 DAYS, DEPENDING ON CLINICAL CONDITION.  Hillary Bow R M.D on 09/07/2016 at 10:31 AM  Between 7am to 6pm - Pager - [phone]  After 6pm go to [email or web address] - password [email or web address] [facility]  Office  [phone]  CC: Primary care physician; [name], DO  Note: This dictation was prepared with Dragon dictation along with smaller phrase technology. Any transcriptional errors that result from this process are unintentional.

## 2016-09-07 NOTE — Op Note (Signed)
Operative note   Surgeon: Dr. [name], DPM.    Assistant: None    Preop diagnosis: Cellulitis with abscess Achilles area left heel    Postop diagnosis: Same    Procedure:   1. I&D abscess deep left Achilles region          EBL: Less than 10 cc    Anesthesia:general with popliteal block delivered by the anesthesia team    Hemostasis: Ankle tourniquet 250 mils mercury pressure for 15 minutes    Specimen: Culture from the Achilles area to the left foot    Complications: None    Operative indications: Abscess cellulitis left Achilles    Procedure:  Patient was brought into the OR and placed on the operating table in thesupine position. After anesthesia was obtained theleft lower extremity was prepped and draped in usual sterile fashion.  Operative Report: This brought the operating placed on the OR table in the supine position. This point after popliteal block and general anesthesia was achieved by the anesthesia team attention was directed to the posterior left Achilles region where a blister and abscess area was palpable. A 5 cm linear incision was made along the posterior oh medial aspect of the Achilles tendon and initially serous drainage was encountered but one the incision was made through the subcutaneous tissue fact pus drained from the area approximately removed proximal 5 6 cc from this area. This was then cultured. This point a hemostat was used to probe mediolaterally there is very mild sinus tracking mediolaterally on the area. Some damage and erosion of the tendon sheath was noted with some exposure of the Achilles tendon in the area secondary to the infection. Tendons appear to be healthy and viable and intact. At this time the area was compressed on all regions of the wound to try to see if any further purulence was noted. Some small area was encountered proximally incision was lengthened proximally by about 1 and 1.5 cm. This area was then cleaned tissues were  checked and all tissues remaining appeared to be healthy. The area was then copiously irrigated with thousand cc normal saline, using a pulse lavage system. Tourniquet was released and all tissues were clean in appearance and good bleeding was noted to the area. At this time the 2 small sinus tracts mediolaterally packed with iodoform gauze. The incision was then closed using 3-0 nylon simple interrupted sutures leaving one area approximately 5 mm in length open for the packing. A sterile dressing was applied across the area consisting of 4 x 4's conformation Kerlix.    Patient tolerated the procedure and anesthesia well.  Was transported from the OR to the PACU with all vital signs stable and vascular status intact. To be discharged per routine protocol.  Will follow up in approximately 1 week in the outpatient clinic.

## 2016-09-07 NOTE — Consult Note (Signed)
[facility] Infectious Disease     Reason for Consult:LLE cellulitis and abscess    Referring Physician: Boykin Reaper Date of Admission:  09/05/2016   Active Problems:   Paroxysmal atrial fibrillation ([place]); CHA2DS2-Vasc Score 5. On Pradaxa   Essential hypertension   Peripheral vascular disease due to secondary diabetes mellitus ([place])   Apnea, sleep   Diabetic peripheral neuropathy associated with type 2 diabetes mellitus (HCC)   Asthma, moderate persistent, well-controlled   Hypothyroidism, adult   Anxiety and depression   Chronic prescription benzodiazepine use   Chronic pain syndrome   Long term current use of opiate analgesic   Cellulitis   HPI: [name] is a 66 y.o. female admitted with acute LLE pain and swelling and found to have infection of heel near achilles. She had on admit wbc 10 and no fevers. Had I and D done 6/18 with findings of abscess down to tendon but no bony involvement and tendon appeared healthy. She is on IV vanco and meropenem. MRSA PCR neg. Cx pending.  She is allergic to PCN.  Past Medical History:  Diagnosis Date  . Anxiety and depression   . Arthritis   . Asthma   . Cystocele   . Depression   . Diabetes ([place])   . DVT (deep venous thrombosis) (HCC)    Righ calf  . GERD (gastroesophageal reflux disease)   . Heart murmur    Echocardiogram June 2015: Mild MR (possible vegetation seen on TTE, not seen on TEE), normal LV size with moderate concentric LVH. Normal function EF 60-65%. Normal diastolic function. Mild LA dilation.  Marland Kitchen History of IBS   . History of methicillin resistant staphylococcus aureus (MRSA) 2008  . Hypothyroidism   . Insomnia   . Kidney stone    kidney stones with lithotripsy  . Neuropathy involving both lower extremities   . Obesity, Class II, BMI 35-39.9, with comorbidity   . Paroxysmal atrial fibrillation ([place]) 2007   In June 2015, Cardiac Event Monitor: Mostly SR/sinus arrhythmia with PVCs that are frequent. Short  bursts of A. fib lasting several minutes;; CHA2DS2-VASc Score = 5 (age, Female, PVD, DM, HTN)  . Peripheral vascular disease ([place])   . Sleep apnea    use C-PAP  . Venous stasis dermatitis of both lower extremities    Past Surgical History:  Procedure Laterality Date  . ANKLE SURGERY Right   . APPLICATION OF WOUND VAC Left 06/27/2015   Procedure: APPLICATION OF WOUND VAC ( POSSIBLE ) ;  Surgeon: [name], MD;  Location: [facility] ORS;  Service: Vascular;  Laterality: Left;  . arm surgery     right    . CHOLECYSTECTOMY    . HERNIA REPAIR     umbilical  . HIATAL HERNIA REPAIR    . I&D EXTREMITY Left 06/27/2015   Procedure: IRRIGATION AND DEBRIDEMENT EXTREMITY            ( CALF HEMATOMA ) POSSIBLE WOUND VAC;  Surgeon: [name], MD;  Location: [facility] ORS;  Service: Vascular;  Laterality: Left;  . JOINT REPLACEMENT     left knee replacement  . KIDNEY SURGERY Right    kidney stones  . LITHOTRIPSY    . NM [facility] HX)  February 2017   Likely breast attenuation. LOW RISK study. Normal EF 55-60%.  . removal of left hematoma Left    leg  . REVERSE SHOULDER ARTHROPLASTY Right 10/08/2015   Procedure: REVERSE SHOULDER ARTHROPLASTY;  Surgeon: Marshall Cork  Poggi, MD;  Location: [facility] ORS;  Service: Orthopedics;  Laterality: Right;  . SLEEVE GASTROPLASTY    . TONSILLECTOMY    . TOTAL KNEE ARTHROPLASTY    . TRANSESOPHAGEAL ECHOCARDIOGRAM  08/08/2013   Mild LVH, EF 60-65%. Moderate LA dilation and mild RA dilation. Mild MR with no evidence of stenosis and no evidence of endocarditis. A false chordae is noted.  . TRANSTHORACIC ECHOCARDIOGRAM  08/03/2013   Mild-Moderate concentric LVH, EF 60-65%. Normal diastolic function. Mild LA dilation. Mild MR with possible vegetation  - not confirmed on TEE   . ULNAR NERVE TRANSPOSITION Right 08/06/2016   Procedure: ULNAR NERVE DECOMPRESSION/TRANSPOSITION;  Surgeon: [name], MD;  Location: [facility] ORS;  Service: Orthopedics;  Laterality: Right;  Marland Kitchen VAGINAL  HYSTERECTOMY     Social History  Substance Use Topics  . Smoking status: Never Smoker  . Smokeless tobacco: Never Used  . Alcohol use No   Family History  Problem Relation Age of Onset  . Skin cancer Father   . Diabetes Father   . Hypertension Father   . Peripheral vascular disease Father   . Cancer Father   . Cerebral aneurysm Father   . Alcohol abuse Father   . Varicose Veins Mother   . Kidney disease Mother   . Arthritis Mother   . Mental illness Sister   . Cancer Maternal Aunt        breast  . Breast cancer Maternal Aunt   . Arthritis Maternal Grandmother   . Hypertension Maternal Grandmother   . Diabetes Maternal Grandmother   . Arthritis Maternal Grandfather   . Heart disease Maternal Grandfather   . Hypertension Maternal Grandfather   . Arthritis Paternal Grandmother   . Hypertension Paternal Grandmother   . Diabetes Paternal Grandmother   . Arthritis Paternal Grandfather   . Hypertension Paternal Grandfather     Allergies:  Allergies  Allergen Reactions  . Penicillins Hives, Shortness Of Breath and Swelling    Facial swelling Has patient had a PCN reaction causing immediate rash, facial/tongue/throat swelling, SOB or lightheadedness with hypotension: Yes Has patient had a PCN reaction causing severe rash involving mucus membranes or skin necrosis: Yes Has patient had a PCN reaction that required hospitalization No Has patient had a PCN reaction occurring within the last 10 years: No If all of the above answers are "NO", then may proceed with Cephalosporin use.   . Aspirin Hives  . Terramycin [Oxytetracycline] Hives    Current antibiotics: Antibiotics Given (last 72 hours)    Date/Time Action Medication Dose Rate   09/05/16 1044 Given   clindamycin (CLEOCIN) capsule 300 mg 300 mg    09/05/16 1541 New Bag/Given   vancomycin (VANCOCIN) 1,500 mg in sodium chloride 0.9 % 500 mL IVPB 1,500 mg 250 mL/hr   09/06/16 0157 New Bag/Given   vancomycin (VANCOCIN)  IVPB 750 mg/150 ml premix 750 mg 150 mL/hr   09/06/16 1653 New Bag/Given   meropenem (MERREM) 1 g in sodium chloride 0.9 % 100 mL IVPB 1 g 200 mL/hr   09/06/16 1654 New Bag/Given   vancomycin (VANCOCIN) IVPB 1000 mg/200 mL premix 1,000 mg 200 mL/hr   09/06/16 2141 New Bag/Given   meropenem (MERREM) 1 g in sodium chloride 0.9 % 100 mL IVPB 1 g 200 mL/hr   09/07/16 0332 New Bag/Given   vancomycin (VANCOCIN) IVPB 1000 mg/200 mL premix 1,000 mg 200 mL/hr   09/07/16 0541 New Bag/Given   meropenem (MERREM) 1 g in sodium chloride 0.9 %  100 mL IVPB 1 g 200 mL/hr   09/07/16 1643 New Bag/Given   vancomycin (VANCOCIN) IVPB 1000 mg/200 mL premix 1,000 mg 200 mL/hr      MEDICATIONS: . buPROPion  300 mg Oral Daily  . DULoxetine  60 mg Oral Daily  . ferrous sulfate  325 mg Oral BID  . gabapentin  800 mg Oral QID  . insulin aspart  0-15 Units Subcutaneous TID WC  . insulin aspart  0-5 Units Subcutaneous QHS  . levothyroxine  150 mcg Oral QAC breakfast  . lidocaine (PF)      . metoprolol tartrate  25 mg Oral BID  . mirabegron ER  25 mg Oral Daily  . mometasone-formoterol  2 puff Inhalation BID  . montelukast  10 mg Oral QHS  . nystatin  5 mL Oral QID  . oxybutynin  10 mg Oral QHS  . pantoprazole  40 mg Oral Daily  . predniSONE  50 mg Oral Q breakfast  . [START ON 09/08/2016] protein supplement shake  11 oz Oral Q24H  . quinapril  20 mg Oral Daily  . ropivacaine (PF) 5 mg/mL (0.5%)        Review of Systems - 11 systems reviewed and negative per HPI   OBJECTIVE: Temp:  [97.3 F (36.3 C)-98.2 F (36.8 C)] 98.1 F (36.7 C) (06/18 1545) Pulse Rate:  [65-84] 73 (06/18 1545) Resp:  [14-29] 18 (06/18 1545) BP: (125-150)/(59-93) 130/70 (06/18 1545) SpO2:  [92 %-100 %] 94 % (06/18 1450) Physical Exam  Constitutional:  oriented to person, place, and time.obese, morbidly  HENT: [place]/AT, PERRLA, no scleral icterus Mouth/Throat: Oropharynx is clear and moist. No oropharyngeal exudate.   Cardiovascular: Normal rate, regular rhythm and normal heart sounds. Exam reveals no gallop and no friction rub.  No murmur heard.  Pulmonary/Chest: Effort normal and breath sounds normal. No respiratory distress.  has no wheezes.  Neck = supple, no nuchal rigidity Abdominal: Soft. Bowel sounds are normal.  exhibits no distension. There is no tenderness.  Lymphadenopathy: no cervical adenopathy. No axillary adenopathy Neurological: alert and oriented to person, place, and time.  Skin: LLE wrapped post op. Has erythema extending up to mid shin and calf. RLE with chronic venous stasis changes Psychiatric: a normal mood and affect.  behavior is normal.    LABS: Results for orders placed or performed during the hospital encounter of 09/05/16 (from the past 48 hour(s))  Glucose, capillary     Status: Abnormal   Collection Time: 09/05/16  9:56 PM  Result Value Ref Range   Glucose-Capillary 173 (H) 65 - 99 mg/dL  Basic metabolic panel     Status: Abnormal   Collection Time: 09/06/16  4:24 AM  Result Value Ref Range   Sodium 135 135 - 145 mmol/L   Potassium 5.1 3.5 - 5.1 mmol/L   Chloride 103 101 - 111 mmol/L   CO2 26 22 - 32 mmol/L   Glucose, Bld 185 (H) 65 - 99 mg/dL   BUN 14 6 - 20 mg/dL   Creatinine, Ser 0.89 0.44 - 1.00 mg/dL   Calcium 8.6 (L) 8.9 - 10.3 mg/dL   GFR calc non Af Amer >60 >60 mL/min   GFR calc Af Amer >60 >60 mL/min    Comment: (NOTE) The eGFR has been calculated using the CKD EPI equation. This calculation has not been validated in all clinical situations. eGFR's persistently <60 mL/min signify possible Chronic Kidney Disease.    Anion gap 6 5 - 15  CBC  Status: Abnormal   Collection Time: 09/06/16  4:24 AM  Result Value Ref Range   WBC 10.7 3.6 - 11.0 K/uL   RBC 4.51 3.80 - 5.20 MIL/uL   Hemoglobin 12.4 12.0 - 16.0 g/dL   HCT 37.5 35.0 - 47.0 %   MCV 83.0 80.0 - 100.0 fL   MCH 27.4 26.0 - 34.0 pg   MCHC 33.0 32.0 - 36.0 g/dL   RDW 19.2 (H) 11.5 -  14.5 %   Platelets 185 150 - 440 K/uL  Glucose, capillary     Status: Abnormal   Collection Time: 09/06/16  7:38 AM  Result Value Ref Range   Glucose-Capillary 190 (H) 65 - 99 mg/dL  Glucose, capillary     Status: Abnormal   Collection Time: 09/06/16 11:45 AM  Result Value Ref Range   Glucose-Capillary 114 (H) 65 - 99 mg/dL  Glucose, capillary     Status: Abnormal   Collection Time: 09/06/16  4:51 PM  Result Value Ref Range   Glucose-Capillary 144 (H) 65 - 99 mg/dL  Glucose, capillary     Status: Abnormal   Collection Time: 09/06/16  9:23 PM  Result Value Ref Range   Glucose-Capillary 249 (H) 65 - 99 mg/dL  Glucose, capillary     Status: Abnormal   Collection Time: 09/07/16  7:28 AM  Result Value Ref Range   Glucose-Capillary 180 (H) 65 - 99 mg/dL  MRSA PCR Screening     Status: None   Collection Time: 09/07/16 10:12 AM  Result Value Ref Range   MRSA by PCR NEGATIVE NEGATIVE    Comment:        The GeneXpert MRSA Assay (FDA approved for NASAL specimens only), is one component of a comprehensive MRSA colonization surveillance program. It is not intended to diagnose MRSA infection nor to guide or monitor treatment for MRSA infections.   Glucose, capillary     Status: Abnormal   Collection Time: 09/07/16  1:39 PM  Result Value Ref Range   Glucose-Capillary 139 (H) 65 - 99 mg/dL  Glucose, capillary     Status: Abnormal   Collection Time: 09/07/16  4:42 PM  Result Value Ref Range   Glucose-Capillary 287 (H) 65 - 99 mg/dL   No components found for: ESR, C REACTIVE PROTEIN MICRO: Recent Results (from the past 720 hour(s))  Culture, blood (Routine X 2) w Reflex to ID Panel     Status: None (Preliminary result)   Collection Time: 09/05/16  3:47 PM  Result Value Ref Range Status   Specimen Description BLOOD BLOOD RIGHT WRIST  Final   Special Requests   Final    BOTTLES DRAWN AEROBIC AND ANAEROBIC Blood Culture adequate volume   Culture NO GROWTH 2 DAYS  Final   Report  Status PENDING  Incomplete  Culture, blood (Routine X 2) w Reflex to ID Panel     Status: None (Preliminary result)   Collection Time: 09/05/16  3:47 PM  Result Value Ref Range Status   Specimen Description BLOOD BLOOD LEFT ARM  Final   Special Requests   Final    BOTTLES DRAWN AEROBIC AND ANAEROBIC Blood Culture adequate volume   Culture NO GROWTH 2 DAYS  Final   Report Status PENDING  Incomplete  MRSA PCR Screening     Status: None   Collection Time: 09/07/16 10:12 AM  Result Value Ref Range Status   MRSA by PCR NEGATIVE NEGATIVE Final    Comment:  The GeneXpert MRSA Assay (FDA approved for NASAL specimens only), is one component of a comprehensive MRSA colonization surveillance program. It is not intended to diagnose MRSA infection nor to guide or monitor treatment for MRSA infections.     IMAGING: Dg Chest 2 View  Result Date: 09/05/2016 CLINICAL DATA:  generalized weakness, right arm pain, and left leg/ankle pain and redness. Pt states that her symptoms started last night. SOB x 1 mo. Pt states she has ulnar surgery to right arm on 08/06/2016 and has been SOB since. EXAM: CHEST  2 VIEW COMPARISON:  08/06/2016 FINDINGS: Heart size is normal. Shallow lung inflation. Elevation of the right hemidiaphragm is stable. There is mild perihilar peribronchial thickening. There are no focal consolidations or pleural effusions. No pulmonary edema. Status post right reverse shoulder arthroplasty. IMPRESSION: 1. Bronchitic changes. 2.  No evidence for acute  abnormality. Electronically Signed   By: Elizabeth  Brown M.D.   On: 09/05/2016 12:51   Ct Angio Chest Pe W Or Wo Contrast  Result Date: 09/05/2016 CLINICAL DATA:  Hypoxia. EXAM: CT ANGIOGRAPHY CHEST WITH CONTRAST TECHNIQUE: Multidetector CT imaging of the chest was performed using the standard protocol during bolus administration of intravenous contrast. Multiplanar CT image reconstructions and MIPs were obtained to evaluate the  vascular anatomy. CONTRAST:  75 cc Isovue 370 intravenously. COMPARISON:  Chest radiograph 09/05/2016 FINDINGS: Cardiovascular: Satisfactory opacification of the pulmonary arteries to the segmental level. No evidence of pulmonary embolism. Mildly enlarged heart. No pericardial effusion. Mitral valve annular calcifications. Mediastinum/Nodes: No enlarged mediastinal, hilar, or axillary lymph nodes. Trachea and esophagus demonstrate no significant findings. Thyroid not visualized. Lungs/Pleura: Low lung volumes. Secondary crowding of the interstitial markings. Right lower lobe atelectasis. Upper Abdomen: Postcholecystectomy at and bariatric surgery. 1.2 cm probable left adrenal adenoma. Otherwise normal. Musculoskeletal: No chest wall abnormality. No acute or significant osseous findings. Findings compatible with diffuse idiopathic skeletal hyperostosis of the thoracic spine Review of the MIP images confirms the above findings. IMPRESSION: No evidence of pulmonary embolus. Mildly enlarged heart with mitral valve annular calcifications. Low lung volumes.  Right lower lobe atelectasis. Electronically Signed   By: Dobrinka  Dimitrova M.D.   On: 09/05/2016 15:04   Mr Ankle Left W Wo Contrast  Result Date: 09/06/2016 CLINICAL DATA:  Pain, redness and swelling involving the left ankle area since Friday. EXAM: MRI OF THE LEFT ANKLE WITHOUT AND WITH CONTRAST TECHNIQUE: Multiplanar, multisequence MR imaging of the ankle was performed before and after the administration of intravenous contrast. CONTRAST:  20mL MULTIHANCE GADOBENATE DIMEGLUMINE 529 MG/ML IV SOLN COMPARISON:  None. FINDINGS: Examination is limited by patient motion. TENDONS Peroneal: Intact. Posteromedial: Intact. Anterior: Intact. Achilles: Mild tendinopathy but no tear. Plantar Fascia: Intact. LIGAMENTS Lateral: Intact Medial: Intact CARTILAGE Ankle Joint: Mild degenerative changes but no full-thickness chondral defect or osteochondral lesion. Small joint  effusion. Subtalar Joints/Sinus Tarsi: Moderate subtalar joint degenerative changes with subchondral cysts. There is also invagination of vascular tissue into the calcaneus along the posterior margin of the sinus tarsi E. there is a small amount of fluid and edema in the sinus tarsi E but the cervical and interosseous ligaments are grossly intact. Bones: No stress fracture or AVN. Moderate midfoot degenerative changes. No findings to suggest septic arthritis or osteomyelitis. Other: Subcutaneous soft tissue swelling/ edema suggesting cellulitis. Diffuse fatty atrophy of the foot and ankle musculature but no findings for myofasciitis or pyomyositis. IMPRESSION: IMPRESSION 1. Diffuse subcutaneous soft tissue swelling/edema/fluid suggesting cellulitis. No findings for focal soft tissue abscess,   pyomyositis, septic arthritis or osteomyelitis. 2. Midfoot and hindfoot degenerative changes and mild ankle joint degenerative changes. No stress fracture or AVN. 3. Intact medial and lateral ankle ligaments and tendons. Electronically Signed   By: Marijo Sanes M.D.   On: 09/06/2016 16:49   US Venous Img Lower Unilateral Left  Result Date: 09/05/2016 CLINICAL DATA:  Left lower extremity pain and erythema EXAM: LEFT LOWER EXTREMITY VENOUS DOPPLER ULTRASOUND TECHNIQUE: Gray-scale sonography with graded compression, as well as color Doppler and duplex ultrasound were performed to evaluate the lower extremity deep venous systems from the level of the common femoral vein and including the common femoral, femoral, profunda femoral, popliteal and calf veins including the posterior tibial, peroneal and gastrocnemius veins when visible. The superficial great saphenous vein was also interrogated. Spectral Doppler was utilized to evaluate flow at rest and with distal augmentation maneuvers in the common femoral, femoral and popliteal veins. COMPARISON:  06/22/2008 left lower extremity venous Doppler scan. FINDINGS: Contralateral  Common Femoral Vein: Respiratory phasicity is normal and symmetric with the symptomatic side. No evidence of thrombus. Normal compressibility. Common Femoral Vein: No evidence of thrombus. Normal compressibility, respiratory phasicity and response to augmentation. Saphenofemoral Junction: No evidence of thrombus. Normal compressibility and flow on color Doppler imaging. Profunda Femoral Vein: No evidence of thrombus. Normal compressibility and flow on color Doppler imaging. Femoral Vein: No evidence of thrombus. Normal compressibility, respiratory phasicity and response to augmentation. Popliteal Vein: No evidence of thrombus. Normal compressibility, respiratory phasicity and response to augmentation. Calf Veins: No evidence of thrombus. Normal compressibility and flow on color Doppler imaging. Superficial Great Saphenous Vein: No evidence of thrombus. Normal compressibility and flow on color Doppler imaging. Venous Reflux:  None. Other Findings:  None. IMPRESSION: No evidence of DVT within the left lower extremity. Electronically Signed   By: Ilona Sorrel M.D.   On: 09/05/2016 12:03    Assessment:   [name] is a 66 y.o. female with L leg cellulitis and abscess collection near achilles tendon sheath now s/p I and D 6/18 with cultures pending. MRI showed no osteomyelitis. MRSA PCR neg. PCN allergic.   Recommendations Continue vanco Can change meropenem to ceftraixone  WIll base final abx recs on culture results but likely can be treated for this skin and soft tissue infection with oral abx.  Thank you very much for allowing me to participate in the care of this patient. Please call with questions.   Cheral Marker. Ola Spurr, MD

## 2016-09-07 NOTE — Anesthesia Procedure Notes (Signed)
Procedure Name: Intubation Date/Time: 09/07/2016 12:49 PM Performed by: Darlyne Russian Pre-anesthesia Checklist: Patient identified, Emergency Drugs available, Suction available, Patient being monitored and Timeout performed Patient Re-evaluated:Patient Re-evaluated prior to inductionOxygen Delivery Method: Circle system utilized Preoxygenation: Pre-oxygenation with 100% oxygen Intubation Type: IV induction Ventilation: Mask ventilation without difficulty Laryngoscope Size: Mac and 3 Grade View: Grade I Tube type: Oral Number of attempts: 1 Airway Equipment and Method: Stylet Placement Confirmation: ETT inserted through vocal cords under direct vision,  positive ETCO2 and breath sounds checked- equal and bilateral Secured at: 23 cm Tube secured with: Tape Dental Injury: Teeth and Oropharynx as per pre-operative assessment

## 2016-09-07 NOTE — Progress Notes (Signed)
Pharmacy Antibiotic Note  [name] is a 66 y.o. female admitted on 09/05/2016 with cellulitis of LLE with possible abscess to posterior ankle.  Pharmacy was consulted for vancomycin 6/17, now consulted for meropenem. Patient received vancomycin 1500mg  X 1 dose in ED on 6/17.   Patient does report PCN allergy that includes hives, SOB, and swelling. MD is aware.   Plan: NEW: DW: 78Kg    Ke: 0.070   T1/2: 9.9   Vd: 57  Will increase  Vancomycin to  1g IV every 12 hours due to abscess. Calculated trough at Css is 15. Trough ordered prior to 5th dose. Expect some accumulation. Monitor renal function and adjust dose as needed.   Will start meropenem 1g IV every 8 hours. Will discuss with nurse patients PMH of allergy to PCN and S/Sx to look out for during administration.   6/18: ID consulted. D/c meropenem, start Ceftriaxone 1 gram IV Q24h.   Height: 5\' 5"  (165.1 cm) Weight: 264 lb (119.7 kg) IBW/kg (Calculated) : 57  Temp (24hrs), Avg:97.9 F (36.6 C), Min:97.3 F (36.3 C), Max:98.2 F (36.8 C)   Recent Labs Lab 09/05/16 0933 09/06/16 0424  WBC 10.8 10.7  CREATININE 1.01* 0.89    Estimated Creatinine Clearance: 79.5 mL/min (by C-G formula based on SCr of 0.89 mg/dL).    Allergies  Allergen Reactions  . Penicillins Hives, Shortness Of Breath and Swelling    Facial swelling Has patient had a PCN reaction causing immediate rash, facial/tongue/throat swelling, SOB or lightheadedness with hypotension: Yes Has patient had a PCN reaction causing severe rash involving mucus membranes or skin necrosis: Yes Has patient had a PCN reaction that required hospitalization No Has patient had a PCN reaction occurring within the last 10 years: No If all of the above answers are "NO", then may proceed with Cephalosporin use.   . Aspirin Hives  . Terramycin [Oxytetracycline] Hives    Antimicrobials this admission: 6/16 vancomycin >>  6/17 meropenem >> 6/18 CTX 6/18 >>  Dose  adjustments this admission:   Microbiology results:  Thank you for allowing pharmacy to be a part of this patient's care.  Noralee Space, PharmD, BCPS Clinical Pharmacist 09/07/2016 8:04 PM

## 2016-09-07 NOTE — Progress Notes (Signed)
Patient in surgery report given to OR nurse. New IV in place. Patient alert and oriented no acute distress noted.

## 2016-09-07 NOTE — Progress Notes (Signed)
Patient Demographics  [name], is a 66 y.o. female   MRN: [id number]   DOB - 01/12/1951  Admit Date - 09/05/2016    Outpatient Primary MD for the patient is [name], DO  Consult requested in the Hospital by Hillary Bow, MD, On 09/07/2016     With History of -  Past Medical History:  Diagnosis Date  . Anxiety and depression   . Arthritis   . Asthma   . Cystocele   . Depression   . Diabetes ([place])   . DVT (deep venous thrombosis) (HCC)    Righ calf  . GERD (gastroesophageal reflux disease)   . Heart murmur    Echocardiogram June 2015: Mild MR (possible vegetation seen on TTE, not seen on TEE), normal LV size with moderate concentric LVH. Normal function EF 60-65%. Normal diastolic function. Mild LA dilation.  Marland Kitchen History of IBS   . History of methicillin resistant staphylococcus aureus (MRSA) 2008  . Hypothyroidism   . Insomnia   . Kidney stone    kidney stones with lithotripsy  . Neuropathy involving both lower extremities   . Obesity, Class II, BMI 35-39.9, with comorbidity   . Paroxysmal atrial fibrillation ([place]) 2007   In June 2015, Cardiac Event Monitor: Mostly SR/sinus arrhythmia with PVCs that are frequent. Short bursts of A. fib lasting several minutes;; CHA2DS2-VASc Score = 5 (age, Female, PVD, DM, HTN)  . Peripheral vascular disease ([place])   . Sleep apnea    use C-PAP  . Venous stasis dermatitis of both lower extremities       Past Surgical History:  Procedure Laterality Date  . ANKLE SURGERY Right   . APPLICATION OF WOUND VAC Left 06/27/2015   Procedure: APPLICATION OF WOUND VAC ( POSSIBLE ) ;  Surgeon: [name], MD;  Location: [facility] ORS;  Service: Vascular;  Laterality: Left;  . arm surgery     right    . CHOLECYSTECTOMY    . HERNIA REPAIR     umbilical  . HIATAL HERNIA  REPAIR    . I&D EXTREMITY Left 06/27/2015   Procedure: IRRIGATION AND DEBRIDEMENT EXTREMITY            ( CALF HEMATOMA ) POSSIBLE WOUND VAC;  Surgeon: [name], MD;  Location: [facility] ORS;  Service: Vascular;  Laterality: Left;  . JOINT REPLACEMENT     left knee replacement  . KIDNEY SURGERY Right    kidney stones  . LITHOTRIPSY    . NM [facility] HX)  February 2017   Likely breast attenuation. LOW RISK study. Normal EF 55-60%.  . removal of left hematoma Left    leg  . REVERSE SHOULDER ARTHROPLASTY Right 10/08/2015   Procedure: REVERSE SHOULDER ARTHROPLASTY;  Surgeon: [name], MD;  Location: [facility] ORS;  Service: Orthopedics;  Laterality: Right;  . SLEEVE GASTROPLASTY    . TONSILLECTOMY    . TOTAL KNEE ARTHROPLASTY    . TRANSESOPHAGEAL ECHOCARDIOGRAM  08/08/2013   Mild LVH, EF 60-65%. Moderate LA dilation and mild RA dilation. Mild MR with no evidence of stenosis and no evidence of endocarditis. A false chordae is noted.  . TRANSTHORACIC ECHOCARDIOGRAM  08/03/2013   Mild-Moderate concentric LVH, EF 60-65%. Normal diastolic function. Mild LA dilation. Mild MR with possible vegetation  - not confirmed on TEE   . ULNAR NERVE TRANSPOSITION Right 08/06/2016   Procedure: ULNAR NERVE DECOMPRESSION/TRANSPOSITION;  Surgeon: [name], MD;  Location: [facility] ORS;  Service: Orthopedics;  Laterality: Right;  Marland Kitchen VAGINAL HYSTERECTOMY      in for   Chief Complaint  Patient presents with  . Arm Pain  . Weakness  . Leg Pain     HPI  [name]  is a 66 y.o. female, Admitted through the emergency room yesterday. She has cellulitis to the left foot and leg states her pain is worse this morning it was yesterday. She is on IV antibiotics this point. She states that she started developing problems after she works out in her yard and she thought appear shoes may rub her Achilles area.    Social History Social History  Substance Use Topics  . Smoking status: Never Smoker  . Smokeless  tobacco: Never Used  . Alcohol use No    Family History Family History  Problem Relation Age of Onset  . Skin cancer Father   . Diabetes Father   . Hypertension Father   . Peripheral vascular disease Father   . Cancer Father   . Cerebral aneurysm Father   . Alcohol abuse Father   . Varicose Veins Mother   . Kidney disease Mother   . Arthritis Mother   . Mental illness Sister   . Cancer Maternal Aunt        breast  . Breast cancer Maternal Aunt   . Arthritis Maternal Grandmother   . Hypertension Maternal Grandmother   . Diabetes Maternal Grandmother   . Arthritis Maternal Grandfather   . Heart disease Maternal Grandfather   . Hypertension Maternal Grandfather   . Arthritis Paternal Grandmother   . Hypertension Paternal Grandmother   . Diabetes Paternal Grandmother   . Arthritis Paternal Grandfather   . Hypertension Paternal Grandfather       Prior to Admission medications   Medication Sig Start Date End Date Taking? Authorizing Provider  acetaminophen (TYLENOL) 500 MG tablet Take 1,000 mg by mouth every 8 (eight) hours as needed for mild pain.   Yes [provider]  albuterol (PROVENTIL HFA;VENTOLIN HFA) 108 (90 Base) MCG/ACT inhaler Inhale 1-2 puffs into the lungs every 4 (four) hours as needed for wheezing or shortness of breath. Patient taking differently: Inhale 2 puffs into the lungs every 4 (four) hours as needed for wheezing or shortness of breath.  03/29/15  Yes Bobetta Lime, MD  albuterol (PROVENTIL) (2.5 MG/3ML) 0.083% nebulizer solution Take 3 mLs (2.5 mg total) by nebulization every 6 (six) hours as needed for wheezing or shortness of breath. 02/21/15  Yes Ashok Norris, MD  ALPRAZolam Duanne Moron) 0.5 MG tablet TAKE ONE TABLET TWICE DAILY AS NEEDED 07/24/16  Yes Cook, Jayce G, DO  buPROPion (WELLBUTRIN XL) 300 MG 24 hr tablet TAKE ONE TABLET BY MOUTH EVERY DAY 07/22/16  Yes Cook, Jayce G, DO  dabigatran (PRADAXA) 150 MG CAPS capsule TAKE 1 CAPSULE BY MOUTH  TWICE DAILY 04/24/16  Yes Cook, Jayce G, DO  Dexlansoprazole 30 MG capsule Take 1 capsule (30 mg total) by mouth daily. 06/30/16  Yes Cook, Jayce G, DO  DULoxetine (CYMBALTA) 60 MG capsule Take 1 capsule (60 mg total) by mouth daily. 09/13/16 12/12/16 Yes King, Diona Foley, NP  ESTRACE VAGINAL 0.1 MG/GM vaginal cream INSERT 1 GRAM VAGINALLY TWICE A WEEK Patient taking differently: INSERT 1 GRAM VAGINALLY TWICE A WEEK (Tuesday and Thursday) 09/18/15  Yes Thersa Salt G, DO  ferrous sulfate 325 (65 FE) MG tablet Take 1 tablet (325 mg total) by mouth 2 (two) times daily. 07/03/16  Yes [name], DO  fluticasone-salmeterol (ADVAIR HFA) 115-21 MCG/ACT inhaler Inhale 2 puffs into the lungs 2 (two) times daily. 10/13/15  Yes Gouru, Illene Silver, MD  gabapentin (NEURONTIN) 800 MG tablet Take 1 tablet (800 mg total) by mouth 4 (four) times daily. Patient taking differently: Take 600 mg by mouth 4 (four) times daily.  09/13/16 12/12/16 Yes Vevelyn Francois, NP  HYDROcodone-acetaminophen (NORCO/VICODIN) 5-325 MG tablet Take 1 tablet by mouth every 8 (eight) hours as needed for severe pain. 09/13/16 10/13/16 Yes Vevelyn Francois, NP  hydrocortisone cream 1 % Apply 1 application topically daily as needed for itching.   Yes [provider]  levocetirizine (XYZAL) 5 MG tablet Take 1 tablet (5 mg total) by mouth every evening. Patient taking differently: Take 5 mg by mouth daily as needed.  07/07/16  Yes Cook, Jayce G, DO  levothyroxine (SYNTHROID, LEVOTHROID) 150 MCG tablet TAKE ONE TABLET BY MOUTH EVERY MORNING 07/22/16  Yes Cook, Jayce G, DO  meloxicam (MOBIC) 15 MG tablet Take 15 mg by mouth daily.  08/22/15  Yes [provider]  metFORMIN (GLUCOPHAGE) 1000 MG tablet TAKE ONE TABLET BY MOUTH TWICE DAILY Patient taking differently: TAKE ONE TABLET IN THE MORNING AND HALF TABLET AT NIGHT 05/21/16  Yes Crecencio Mc, MD  metoprolol tartrate (LOPRESSOR) 25 MG tablet TAKE ONE TABLET TWICE DAILY 07/01/16  Yes Cook, Jayce G,  DO  mirabegron ER (MYRBETRIQ) 25 MG TB24 tablet Take 1 tablet (25 mg total) by mouth daily. 12/05/15  Yes McGowan, [name] A, PA-C  montelukast (SINGULAIR) 10 MG tablet TAKE ONE TABLET BY MOUTH EVERY DAY 04/24/16  Yes Cook, Jayce G, DO  oxybutynin (DITROPAN-XL) 10 MG 24 hr tablet Take 1 tablet (10 mg total) by mouth daily. Patient taking differently: Take 10 mg by mouth at bedtime.  12/05/15  Yes McGowan, [name] A, PA-C  quinapril (ACCUPRIL) 20 MG tablet TAKE ONE TABLET EVERY DAY 05/26/16  Yes Cook, Jayce G, DO  HYDROcodone-acetaminophen (NORCO/VICODIN) 5-325 MG tablet Take 1 tablet by mouth every 8 (eight) hours as needed for severe pain. Patient not taking: Reported on 09/05/2016 10/13/16 11/12/16  Vevelyn Francois, NP  HYDROcodone-acetaminophen (NORCO/VICODIN) 5-325 MG tablet Take 1 tablet by mouth every 8 (eight) hours as needed for severe pain. Patient not taking: Reported on 09/05/2016 11/12/16 12/12/16  Vevelyn Francois, NP  ipratropium (ATROVENT) 0.06 % nasal spray Place 2 sprays into both nostrils 4 (four) times daily. Patient not taking: Reported on 09/05/2016 06/09/16   [name], DO    Anti-infectives    Start     Dose/Rate Route Frequency Ordered Stop   09/06/16 1600  vancomycin (VANCOCIN) IVPB 1000 mg/200 mL premix     1,000 mg 200 mL/hr over 60 Minutes Intravenous Every 12 hours 09/06/16 1552     09/06/16 1400  meropenem (MERREM) 1 g in sodium chloride 0.9 % 100 mL IVPB     1 g 200 mL/hr over 30 Minutes Intravenous Every 8 hours 09/06/16 1103     09/06/16 1400  vancomycin (VANCOCIN) IVPB 1000 mg/200 mL premix  Status:  Discontinued     1,000 mg 200 mL/hr over 60 Minutes Intravenous Every 12 hours 09/06/16 1122 09/06/16 1552   09/06/16 0200  vancomycin (VANCOCIN) IVPB 750 mg/150 ml premix  Status:  Discontinued     750 mg 150 mL/hr over 60 Minutes Intravenous Every 12 hours 09/05/16 1621 09/06/16 1122   09/05/16 1530  vancomycin (VANCOCIN) 1,500 mg in sodium chloride 0.9 % 500 mL IVPB  1,500 mg 250 mL/hr over 120 Minutes Intravenous  Once 09/05/16 1518 09/05/16 1741   09/05/16 1045  clindamycin (CLEOCIN) capsule 300 mg     300 mg Oral  Once 09/05/16 1033 09/05/16 1044      Scheduled Meds: . buPROPion  300 mg Oral Daily  . DULoxetine  60 mg Oral Daily  . ferrous sulfate  325 mg Oral BID  . gabapentin  800 mg Oral QID  . insulin aspart  0-15 Units Subcutaneous TID WC  . insulin aspart  0-5 Units Subcutaneous QHS  . levothyroxine  150 mcg Oral QAC breakfast  . metoprolol tartrate  25 mg Oral BID  . mirabegron ER  25 mg Oral Daily  . mometasone-formoterol  2 puff Inhalation BID  . montelukast  10 mg Oral QHS  . nystatin  5 mL Oral QID  . oxybutynin  10 mg Oral QHS  . pantoprazole  40 mg Oral Daily  . predniSONE  50 mg Oral Q breakfast  . quinapril  20 mg Oral Daily   Continuous Infusions: . meropenem (MERREM) IV Stopped (09/07/16 7169)  . vancomycin Stopped (09/07/16 0432)   PRN Meds:.ALPRAZolam, HYDROcodone-acetaminophen, ipratropium-albuterol, loratadine, morphine injection, ondansetron (ZOFRAN) IV, senna-docusate  Allergies  Allergen Reactions  . Penicillins Hives, Shortness Of Breath and Swelling    Facial swelling Has patient had a PCN reaction causing immediate rash, facial/tongue/throat swelling, SOB or lightheadedness with hypotension: Yes Has patient had a PCN reaction causing severe rash involving mucus membranes or skin necrosis: Yes Has patient had a PCN reaction that required hospitalization No Has patient had a PCN reaction occurring within the last 10 years: No If all of the above answers are "NO", then may proceed with Cephalosporin use.   . Aspirin Hives  . Terramycin [Oxytetracycline] Hives    Physical Exam  Vitals  Blood pressure 125/67, pulse 65, temperature 98.1 F (36.7 C), temperature source Oral, resp. rate 20, height 5\' 5"  (1.651 m), weight 119.7 kg (264 lb), SpO2 93 %.  Lower Extremity exam:  Vascular:Patient does have  palpable pulses bilaterally are diminished.  Dermatological: The posterior wound is starting to organize more there is an area of fluctuance posterior to the Achilles tendon that is likely going to have fluid and is a palpable at this point. She states does hurt more and that region at this point. She complains quite a bit and asked for pain medicine.  Neurological: Patient likely has a level of diabetic neuropathy but complains of pain in that Achilles region. She didn't allow me to really palpate a much yesterday.  Ortho: No gross deformities this point. Patient does have overall what appears to be some muscle weakness likely associated with sensorimotor neuropathy due to diabetes.  Data Review  CBC  Recent Labs Lab 09/05/16 0933 09/06/16 0424  WBC 10.8 10.7  HGB 12.0 12.4  HCT 35.9 37.5  PLT 220 185  MCV 82.7 83.0  MCH 27.7 27.4  MCHC 33.5 33.0  RDW 19.3* 19.2*   ------------------------------------------------------------------------------------------------------------------  Chemistries   Recent Labs Lab 09/05/16 0933 09/06/16 0424  NA 134* 135  K 4.8 5.1  CL 100* 103  CO2 26 26  GLUCOSE 190* 185*  BUN 22* 14  CREATININE 1.01* 0.89  CALCIUM 9.0 8.6*   --------Imaging results:     Assessment & Plan : He will the MRI didn't show specific abscess appears to be more superficial eyes with palpable fluid underneath the skin external to the Achilles tendon and the posterior  Achilles region. Think this area needs to be drained and hopefully repaired for she estimates skin damage but she may end up with some skin sloughing in the region regardless. He still has significant cellulitis with maybe a little improved today since the IV antibiotics.  Active Problems:   Paroxysmal atrial fibrillation (HCC); CHA2DS2-Vasc Score 5. On Pradaxa   Essential hypertension   Peripheral vascular disease due to secondary diabetes mellitus ([place])   Apnea, sleep   Diabetic peripheral  neuropathy associated with type 2 diabetes mellitus (HCC)   Asthma, moderate persistent, well-controlled   Hypothyroidism, adult   Anxiety and depression   Chronic prescription benzodiazepine use   Chronic pain syndrome   Long term current use of opiate analgesic   Cellulitis     Family Communication: Plan discussed with patient .   Perry Mount M.D on 09/07/2016 at 8:36 AM

## 2016-09-07 NOTE — Transfer of Care (Signed)
Immediate Anesthesia Transfer of Care Note  Patient: [name]  Procedure(s) Performed: Procedure(s): IRRIGATION AND DEBRIDEMENT ABSCESS LEFT HEEL (Left)  Patient Location: PACU  Anesthesia Type:General  Level of Consciousness: awake and patient cooperative  Airway & Oxygen Therapy: Patient Spontanous Breathing and Patient connected to nasal cannula oxygen  Post-op Assessment: Report given to RN and Post -op Vital signs reviewed and stable  Post vital signs: Reviewed and stable  Last Vitals:  Vitals:   09/07/16 1200 09/07/16 1332  BP: (!) 144/71 (!) 139/93  Pulse: 83 84  Resp: 20 14  Temp: 36.7 C 36.3 C    Last Pain:  Vitals:   09/07/16 1332  TempSrc: Temporal  PainSc: 0-No pain         Complications: No apparent anesthesia complications

## 2016-09-07 NOTE — Anesthesia Procedure Notes (Signed)
Anesthesia Regional Block: Popliteal block   Pre-Anesthetic Checklist: ,, timeout performed, Correct Patient, Correct Site, Correct Laterality, Correct Procedure, Correct Position, site marked, Risks and benefits discussed,  Surgical consent,  Pre-op evaluation,  At surgeon's request and post-op pain management  Laterality: Left and Lower  Prep: chloraprep       Needles:  Injection technique: Single-shot  Needle Type: Echogenic Stimulator Needle     Needle Length: 10cm  Needle Gauge: 22     Additional Needles:   Procedures: ultrasound guided,,,,,,,,  Narrative:  Start time: 09/07/2016 12:40 PM End time: 09/07/2016 12:44 PM  Performed by: Personally  Anesthesiologist: Martha Clan  Additional Notes: Patient identified, correct laterality and site verified.  Patient tolerated the procedure well without complications.  No signs of intravascular injection.  Good spread on ultrasound, picture in chart.

## 2016-09-07 NOTE — Anesthesia Post-op Follow-up Note (Cosign Needed)
Anesthesia QCDR form completed.        

## 2016-09-07 NOTE — Progress Notes (Signed)
Initial Nutrition Assessment  DOCUMENTATION CODES:   Morbid obesity  INTERVENTION:  Provide Premier Protein po once daily, each supplement provides 160 kcal and 30 grams of protein. Patient prefers chocolate.  Encouraged small, frequent meals throughout the day. Encouraged patient to eat a good protein source at each meal.  Encouraged patient to start taking her vitamin/mineral supplements again. Discussed they are meant to be taken for the rest of her life as she has decreased ability to absorb these nutrients and is at risk for deficiency.   Encouraged patient to follow-up with weight loss center.   NUTRITION DIAGNOSIS:   Increased nutrient needs (protein, calcium, vitamin D, B vitamins (especially B12)) related to altered GI function as evidenced by  (hx of sleeve gastrectomy).  GOAL:   Patient will meet greater than or equal to 90% of their needs  MONITOR:   PO intake, Supplement acceptance, Labs, Weight trends, I & O's  REASON FOR ASSESSMENT:   Malnutrition Screening Tool    ASSESSMENT:   66 year old female with PMHx of hypothyroidism, DM, venous stasis dermatitis of both lower extremities, paroxysmal atrial fibrillation, GERD, hx of DVT, anxiety, depression, PVD, hx of IBW, hx of laparoscopic sleeve gastrectomy on 11/23/2012 per chart, now admitted with cellulitis to left lower extremity.   -Per chart plan is for irrigation and debridement of abscess on left heel today.  Spoke with patient, husband, and other family members at bedside. Patient reports her appetite is good. She usually eats two meals per day. She will have fruit and cottage cheese in the morning. Later has another meal. Reports she eats protein at her meals but has stopped drinking her protein shakes. Reports she was recently re-reading her material from her weight loss center and would like to start eating small, frequent meals during the day. Patient is reporting her operation was 1.5 years ago, but per  chart it looks like it was in 2014. She reports she has lost about 100 lbs since the operation but weight loss has slowed down. Per chart patient was 260 lbs in 2014, so she is actually weight stable. She reports she does not follow-up with weight loss center anymore. Also has stopped taking her vitamin/mineral supplements. Only takes iron now. Patient is requesting a protein shake daily when she is able to eat after her operation.  Meal Completion: 75-100% before being made NPO  Medications reviewed and include: Wellbutrin, ferrous sulfate 325 mg BID, Novolog 0-15 units TID, Novolog 0-5 units QHS, levothyroxine, pantoprazole, prednisone 50 mg daily, vancomycin.  Labs reviewed: CBG 114-249 past 24 hrs. HgbA1c 6.9 on 06/09/2016.  Nutrition-Focused physical exam completed. Findings are mild fat depletion in upper arm region, mild-moderate muscle depletion, and mild edema. Difficult to truly assess muscle status in setting of obesity, but patient reports she feels like she has lost a significant amount of muscle.  Patient does not meet the criteria for malnutrition at this time.  Diet Order:  Diet NPO time specified  Skin:  Wound (see comment) (cellulitis left lower leg)  Last BM:  PTA (09/04/2016 per chart)  Height:   Ht Readings from Last 1 Encounters:  09/05/16 5\' 5"  (1.651 m)    Weight:   Wt Readings from Last 1 Encounters:  09/05/16 264 lb (119.7 kg)    Ideal Body Weight:  56.8 kg  BMI:  Body mass index is 43.93 kg/m.  Estimated Nutritional Needs:   Kcal:  1910-2250 (MSJ x 1.1-1.3)  Protein:  95-120 grams (0.8-1 grams/kg  ABW)  Fluid:  1.9-2.2 L/day  EDUCATION NEEDS:   Education needs addressed  Willey Blade, MS, RD, LDN Pager: [phone] After Hours Pager: [phone]

## 2016-09-07 NOTE — Anesthesia Preprocedure Evaluation (Signed)
Anesthesia Evaluation  Patient identified by MRN, date of birth, ID band Patient awake    Reviewed: Allergy & Precautions, NPO status , Patient's Chart, lab work & pertinent test results, reviewed documented beta blocker date and time   History of Anesthesia Complications Negative for: history of anesthetic complications  Airway Mallampati: II  TM Distance: >3 FB Neck ROM: Full    Dental  (+) Chipped, Dental Advidsory Given 2 crowns on lower molars :   Pulmonary shortness of breath and with exertion, asthma , sleep apnea and Continuous Positive Airway Pressure Ventilation , COPD,  COPD inhaler, neg recent URI,           Cardiovascular Exercise Tolerance: Good hypertension, Pt. on home beta blockers (-) angina+ Peripheral Vascular Disease and + DOE  (-) CAD, (-) Past MI, (-) Cardiac Stents and (-) CABG + dysrhythmias (atrial fibrillation, on pradaxea, held since 7/15) Atrial Fibrillation + Valvular Problems/Murmurs  - Systolic murmurs    Neuro/Psych neg Seizures PSYCHIATRIC DISORDERS Anxiety Depression  Neuromuscular disease    GI/Hepatic Neg liver ROS, GERD  Controlled,  Endo/Other  diabetes, Well Controlled, Type 2, Oral Hypoglycemic AgentsHypothyroidism (synthroid for replacement) Morbid obesity  Renal/GU Renal disease (nephrolithiasis)  negative genitourinary   Musculoskeletal  (+) Arthritis , Osteoarthritis,    Abdominal (+) + obese,   Peds  Hematology negative hematology ROS (+)   Anesthesia Other Findings Past Medical History: No date: Anxiety and depression No date: Arthritis No date: Asthma No date: Cystocele No date: Depression No date: Diabetes ([place]) No date: DVT (deep venous thrombosis) (HCC)     Comment: Righ calf No date: GERD (gastroesophageal reflux disease) No date: Heart murmur     Comment: Echocardiogram June 2015: Mild MR (possible               vegetation seen on TTE, not seen on TEE),            normal LV size with moderate concentric LVH.               Normal function EF 60-65%. Normal diastolic               function. Mild LA dilation. No date: History of IBS 2008: History of methicillin resistant staphylococcu* No date: Hypothyroidism No date: Insomnia No date: Kidney stone     Comment: kidney stones with lithotripsy No date: Neuropathy involving both lower extremities No date: Obesity, Class II, BMI 35-39.9, with comorbidi* 2007: Paroxysmal atrial fibrillation ([place])     Comment: In June 2015, Cardiac Event Monitor: Mostly               SR/sinus arrhythmia with PVCs that are               frequent. Short bursts of A. fib lasting               several minutes;; CHA2DS2-VASc Score = 5 (age,               Female, PVD, DM, HTN) No date: Peripheral vascular disease ([place]) No date: Sleep apnea     Comment: use C-PAP No date: Venous stasis dermatitis of both lower extremi*   Reproductive/Obstetrics                             Anesthesia Physical  Anesthesia Plan  ASA: III  Anesthesia Plan: General and Regional   Post-op Pain Management: GA combined w/ Regional  for post-op pain   Induction: Intravenous  PONV Risk Score and Plan: 2 and Ondansetron and Dexamethasone  Airway Management Planned: Oral ETT  Additional Equipment:   Intra-op Plan:   Post-operative Plan: Extubation in OR  Informed Consent: I have reviewed the patients History and Physical, chart, labs and discussed the procedure including the risks, benefits and alternatives for the proposed anesthesia with the patient or authorized representative who has indicated his/her understanding and acceptance.   Dental advisory given  Plan Discussed with: Surgeon and CRNA  Anesthesia Plan Comments:         Anesthesia Quick Evaluation

## 2016-09-08 ENCOUNTER — Encounter: Payer: Self-pay | Admitting: Podiatry

## 2016-09-08 LAB — CBC WITH DIFFERENTIAL/PLATELET
BASOS PCT: 0 %
Basophils Absolute: 0 10*3/uL (ref 0–0.1)
Eosinophils Absolute: 0 10*3/uL (ref 0–0.7)
Eosinophils Relative: 0 %
HEMATOCRIT: 33.3 % — AB (ref 35.0–47.0)
HEMOGLOBIN: 11.2 g/dL — AB (ref 12.0–16.0)
LYMPHS ABS: 0.3 10*3/uL — AB (ref 1.0–3.6)
Lymphocytes Relative: 4 %
MCH: 28.3 pg (ref 26.0–34.0)
MCHC: 33.7 g/dL (ref 32.0–36.0)
MCV: 83.9 fL (ref 80.0–100.0)
MONO ABS: 0.4 10*3/uL (ref 0.2–0.9)
Monocytes Relative: 6 %
NEUTROS ABS: 6.8 10*3/uL — AB (ref 1.4–6.5)
NEUTROS PCT: 90 %
Platelets: 221 10*3/uL (ref 150–440)
RBC: 3.96 MIL/uL (ref 3.80–5.20)
RDW: 18.5 % — AB (ref 11.5–14.5)
WBC: 7.5 10*3/uL (ref 3.6–11.0)

## 2016-09-08 LAB — BASIC METABOLIC PANEL
Anion gap: 7 (ref 5–15)
BUN: 21 mg/dL — ABNORMAL HIGH (ref 6–20)
CALCIUM: 8.9 mg/dL (ref 8.9–10.3)
CHLORIDE: 100 mmol/L — AB (ref 101–111)
CO2: 29 mmol/L (ref 22–32)
CREATININE: 0.71 mg/dL (ref 0.44–1.00)
GFR calc non Af Amer: >60 mL/min (ref >60)
GLUCOSE: 232 mg/dL — AB (ref 65–99)
Potassium: 5.3 mmol/L — ABNORMAL HIGH (ref 3.5–5.1)
Sodium: 136 mmol/L (ref 135–145)

## 2016-09-08 LAB — SEDIMENTATION RATE: Sed Rate: 58 mm/hr — ABNORMAL HIGH (ref 0–30)

## 2016-09-08 LAB — VANCOMYCIN, TROUGH: VANCOMYCIN TR: 17 ug/mL (ref 15–20)

## 2016-09-08 LAB — ROCKY MTN SPOTTED FVR ABS PNL(IGG+IGM)
RMSF IGM: 0.2 {index} (ref 0.00–0.89)
RMSF IgG: NEGATIVE

## 2016-09-08 LAB — GLUCOSE, CAPILLARY
GLUCOSE-CAPILLARY: 240 mg/dL — AB (ref 65–99)
GLUCOSE-CAPILLARY: 245 mg/dL — AB (ref 65–99)
Glucose-Capillary: 213 mg/dL — ABNORMAL HIGH (ref 65–99)
Glucose-Capillary: 232 mg/dL — ABNORMAL HIGH (ref 65–99)

## 2016-09-08 LAB — C-REACTIVE PROTEIN: CRP: 14.7 mg/dL — ABNORMAL HIGH (ref <1.0)

## 2016-09-08 MED ORDER — METFORMIN HCL 500 MG PO TABS
1000.0000 mg | ORAL_TABLET | Freq: Every day | ORAL | Status: DC
  Administered 2016-09-09: 1000 mg via ORAL
  Filled 2016-09-08: qty 2

## 2016-09-08 MED ORDER — METFORMIN HCL 500 MG PO TABS
500.0000 mg | ORAL_TABLET | Freq: Every day | ORAL | Status: DC
  Administered 2016-09-08 – 2016-09-09 (×2): 500 mg via ORAL
  Filled 2016-09-08 (×2): qty 1

## 2016-09-08 NOTE — Progress Notes (Signed)
Inpatient Diabetes Program Recommendations  AACE/ADA: New Consensus Statement on Inpatient Glycemic Control (2015)  Target Ranges:  Prepandial:   less than 140 mg/dL      Peak postprandial:   less than 180 mg/dL (1-2 hours)      Critically ill patients:  140 - 180 mg/dL   Lab Results  Component Value Date   GLUCAP 213 (H) 09/08/2016   HGBA1C 6.9 (H) 06/09/2016    Review of Glycemic Control:  Results for MODESTINE, SCHERZINGER (MRN [id number]) as of 09/08/2016 11:12  Ref. Range 09/07/2016 13:39 09/07/2016 16:42 09/07/2016 21:14 09/08/2016 07:28  Glucose-Capillary Latest Ref Range: 65 - 99 mg/dL 139 (H) 287 (H) 228 (H) 213 (H)    Diabetes history: Type 2 diabetes Outpatient Diabetes medications: Metformin 500 mg bid Current orders for Inpatient glycemic control:  Novolog moderate tid with meals and HS Prednisone 50 mg daily Inpatient Diabetes Program Recommendations:    While on steroids, consider adding Novolog 4 units tid with meals meals.    Thanks, Adah Perl, RN, BC-ADM Inpatient Diabetes Coordinator Pager [phone] (8a-5p)

## 2016-09-08 NOTE — Progress Notes (Signed)
Patient Demographics  [name], is a 66 y.o. female   MRN: [id number]   DOB - February 07, 1951  Admit Date - 09/05/2016    Outpatient Primary MD for the patient is [name], DO  Consult requested in the Hospital by Fritzi Mandes, MD, On 09/08/2016     With History of -  Past Medical History:  Diagnosis Date  . Anxiety and depression   . Arthritis   . Asthma   . Cystocele   . Depression   . Diabetes ([place])   . DVT (deep venous thrombosis) (HCC)    Righ calf  . GERD (gastroesophageal reflux disease)   . Heart murmur    Echocardiogram June 2015: Mild MR (possible vegetation seen on TTE, not seen on TEE), normal LV size with moderate concentric LVH. Normal function EF 60-65%. Normal diastolic function. Mild LA dilation.  Marland Kitchen History of IBS   . History of methicillin resistant staphylococcus aureus (MRSA) 2008  . Hypothyroidism   . Insomnia   . Kidney stone    kidney stones with lithotripsy  . Neuropathy involving both lower extremities   . Obesity, Class II, BMI 35-39.9, with comorbidity   . Paroxysmal atrial fibrillation ([place]) 2007   In June 2015, Cardiac Event Monitor: Mostly SR/sinus arrhythmia with PVCs that are frequent. Short bursts of A. fib lasting several minutes;; CHA2DS2-VASc Score = 5 (age, Female, PVD, DM, HTN)  . Peripheral vascular disease ([place])   . Sleep apnea    use C-PAP  . Venous stasis dermatitis of both lower extremities       Past Surgical History:  Procedure Laterality Date  . ANKLE SURGERY Right   . APPLICATION OF WOUND VAC Left 06/27/2015   Procedure: APPLICATION OF WOUND VAC ( POSSIBLE ) ;  Surgeon: [name], MD;  Location: [facility];  Service: Vascular;  Laterality: Left;  . arm surgery     right    . CHOLECYSTECTOMY    . HERNIA REPAIR     umbilical  . HIATAL HERNIA  REPAIR    . I&D EXTREMITY Left 06/27/2015   Procedure: IRRIGATION AND DEBRIDEMENT EXTREMITY            ( CALF HEMATOMA ) POSSIBLE WOUND VAC;  Surgeon: [name], MD;  Location: [facility] OR[facility];  Service: Vascular;  Laterality: Left;  . IRRIGATION AND DEBRIDEMENT ABSCESS Left 09/07/2016   Procedure: IRRIGATION AND DEBRIDEMENT ABSCESS LEFT HEEL;  Surgeon: [name], DPM;  Location: [facility];  Service: Podiatry;  Laterality: Left;  . JOINT REPLACEMENT     left knee replacement  . KIDNEY SURGERY Right    kidney stones  . LITHOTRIPSY    . NM [facility] HX)  February 2017   Likely breast attenuation. LOW RISK study. Normal EF 55-60%.  . removal of left hematoma Left    leg  . REVERSE SHOULDER ARTHROPLASTY Right 10/08/2015   Procedure: REVERSE SHOULDER ARTHROPLASTY;  Surgeon: [name], MD;  Location: [facility] OR[facility];  Service: Orthopedics;  Laterality: Right;  . SLEEVE GASTROPLASTY    . TONSILLECTOMY    . TOTAL KNEE ARTHROPLASTY    . TRANSESOPHAGEAL ECHOCARDIOGRAM  08/08/2013  Mild LVH, EF 60-65%. Moderate LA dilation and mild RA dilation. Mild MR with no evidence of stenosis and no evidence of endocarditis. A false chordae is noted.  . TRANSTHORACIC ECHOCARDIOGRAM  08/03/2013   Mild-Moderate concentric LVH, EF 60-65%. Normal diastolic function. Mild LA dilation. Mild MR with possible vegetation  - not confirmed on TEE   . ULNAR NERVE TRANSPOSITION Right 08/06/2016   Procedure: ULNAR NERVE DECOMPRESSION/TRANSPOSITION;  Surgeon: [name], MD;  Location: [facility] OR[facility];  Service: Orthopedics;  Laterality: Right;  Marland Kitchen VAGINAL HYSTERECTOMY      in for   Chief Complaint  Patient presents with  . Arm Pain  . Weakness  . Leg Pain     HPI  Clotile Whittington  is a 66 y.o. female, One day status post I&D of an abscess from left Achilles. She was admitted to the hospital over the weekend with redness cellulitis and pain to the back of the left Achilles. She denies any open wound on the region and  this started actually been working in the yard 7.   Social History Social History  Substance Use Topics  . Smoking status: Never Smoker  . Smokeless tobacco: Never Used  . Alcohol use No     Family History Family History  Problem Relation Age of Onset  . Skin cancer Father   . Diabetes Father   . Hypertension Father   . Peripheral vascular disease Father   . Cancer Father   . Cerebral aneurysm Father   . Alcohol abuse Father   . Varicose Veins Mother   . Kidney disease Mother   . Arthritis Mother   . Mental illness Sister   . Cancer Maternal Aunt        breast  . Breast cancer Maternal Aunt   . Arthritis Maternal Grandmother   . Hypertension Maternal Grandmother   . Diabetes Maternal Grandmother   . Arthritis Maternal Grandfather   . Heart disease Maternal Grandfather   . Hypertension Maternal Grandfather   . Arthritis Paternal Grandmother   . Hypertension Paternal Grandmother   . Diabetes Paternal Grandmother   . Arthritis Paternal Grandfather   . Hypertension Paternal Grandfather      Prior to Admission medications   Medication Sig Start Date End Date Taking? Authorizing Provider  acetaminophen (TYLENOL) 500 MG tablet Take 1,000 mg by mouth every 8 (eight) hours as needed for mild pain.   Yes [provider]  albuterol (PROVENTIL HFA;VENTOLIN HFA) 108 (90 Base) MCG/ACT inhaler Inhale 1-2 puffs into the lungs every 4 (four) hours as needed for wheezing or shortness of breath. Patient taking differently: Inhale 2 puffs into the lungs every 4 (four) hours as needed for wheezing or shortness of breath.  03/29/15  Yes Bobetta Lime, MD  albuterol (PROVENTIL) (2.5 MG/3ML) 0.083% nebulizer solution Take 3 mLs (2.5 mg total) by nebulization every 6 (six) hours as needed for wheezing or shortness of breath. 02/21/15  Yes Ashok Norris, MD  ALPRAZolam Duanne Moron) 0.5 MG tablet TAKE ONE TABLET TWICE DAILY AS NEEDED 07/24/16  Yes Cook, Jayce G, DO  buPROPion (WELLBUTRIN  XL) 300 MG 24 hr tablet TAKE ONE TABLET BY MOUTH EVERY DAY 07/22/16  Yes Cook, Jayce G, DO  dabigatran (PRADAXA) 150 MG CAPS capsule TAKE 1 CAPSULE BY MOUTH TWICE DAILY 04/24/16  Yes Cook, Jayce G, DO  Dexlansoprazole 30 MG capsule Take 1 capsule (30 mg total) by mouth daily. 06/30/16  Yes Cook, Jayce G, DO  DULoxetine (CYMBALTA) 60 MG  capsule Take 1 capsule (60 mg total) by mouth daily. 09/13/16 12/12/16 Yes King, Diona Foley, NP  ESTRACE VAGINAL 0.1 MG/GM vaginal cream INSERT 1 GRAM VAGINALLY TWICE A WEEK Patient taking differently: INSERT 1 GRAM VAGINALLY TWICE A WEEK (Tuesday and Thursday) 09/18/15  Yes Thersa Salt G, DO  ferrous sulfate 325 (65 FE) MG tablet Take 1 tablet (325 mg total) by mouth 2 (two) times daily. 07/03/16  Yes Cook, Jayce G, DO  fluticasone-salmeterol (ADVAIR HFA) 932-67 MCG/ACT inhaler Inhale 2 puffs into the lungs 2 (two) times daily. 10/13/15  Yes Gouru, Illene Silver, MD  gabapentin (NEURONTIN) 800 MG tablet Take 1 tablet (800 mg total) by mouth 4 (four) times daily. Patient taking differently: Take 600 mg by mouth 4 (four) times daily.  09/13/16 12/12/16 Yes Vevelyn Francois, NP  HYDROcodone-acetaminophen (NORCO/VICODIN) 5-325 MG tablet Take 1 tablet by mouth every 8 (eight) hours as needed for severe pain. 09/13/16 10/13/16 Yes Vevelyn Francois, NP  hydrocortisone cream 1 % Apply 1 application topically daily as needed for itching.   Yes [provider]  levocetirizine (XYZAL) 5 MG tablet Take 1 tablet (5 mg total) by mouth every evening. Patient taking differently: Take 5 mg by mouth daily as needed.  07/07/16  Yes Cook, Jayce G, DO  levothyroxine (SYNTHROID, LEVOTHROID) 150 MCG tablet TAKE ONE TABLET BY MOUTH EVERY MORNING 07/22/16  Yes Cook, Jayce G, DO  meloxicam (MOBIC) 15 MG tablet Take 15 mg by mouth daily.  08/22/15  Yes [provider]  metFORMIN (GLUCOPHAGE) 1000 MG tablet TAKE ONE TABLET BY MOUTH TWICE DAILY Patient taking differently: TAKE ONE TABLET IN THE MORNING AND  HALF TABLET AT NIGHT 05/21/16  Yes Crecencio Mc, MD  metoprolol tartrate (LOPRESSOR) 25 MG tablet TAKE ONE TABLET TWICE DAILY 07/01/16  Yes Cook, Jayce G, DO  mirabegron ER (MYRBETRIQ) 25 MG TB24 tablet Take 1 tablet (25 mg total) by mouth daily. 12/05/15  Yes McGowan, [name] A, PA-C  montelukast (SINGULAIR) 10 MG tablet TAKE ONE TABLET BY MOUTH EVERY DAY 04/24/16  Yes Cook, Jayce G, DO  oxybutynin (DITROPAN-XL) 10 MG 24 hr tablet Take 1 tablet (10 mg total) by mouth daily. Patient taking differently: Take 10 mg by mouth at bedtime.  12/05/15  Yes McGowan, [name] A, PA-C  quinapril (ACCUPRIL) 20 MG tablet TAKE ONE TABLET EVERY DAY 05/26/16  Yes Cook, Jayce G, DO  HYDROcodone-acetaminophen (NORCO/VICODIN) 5-325 MG tablet Take 1 tablet by mouth every 8 (eight) hours as needed for severe pain. Patient not taking: Reported on 09/05/2016 10/13/16 11/12/16  Vevelyn Francois, NP  HYDROcodone-acetaminophen (NORCO/VICODIN) 5-325 MG tablet Take 1 tablet by mouth every 8 (eight) hours as needed for severe pain. Patient not taking: Reported on 09/05/2016 11/12/16 12/12/16  Vevelyn Francois, NP  ipratropium (ATROVENT) 0.06 % nasal spray Place 2 sprays into both nostrils 4 (four) times daily. Patient not taking: Reported on 09/05/2016 06/09/16   [name], DO    Anti-infectives    Start     Dose/Rate Route Frequency Ordered Stop   09/07/16 2100  cefTRIAXone (ROCEPHIN) 1 g in dextrose 5 % 50 mL IVPB     1 g 120 mL/hr over 30 Minutes Intravenous Every 24 hours 09/07/16 2003     09/06/16 1600  vancomycin (VANCOCIN) IVPB 1000 mg/200 mL premix     1,000 mg 200 mL/hr over 60 Minutes Intravenous Every 12 hours 09/06/16 1552     09/06/16 1400  meropenem (MERREM) 1 g  in sodium chloride 0.9 % 100 mL IVPB  Status:  Discontinued     1 g 200 mL/hr over 30 Minutes Intravenous Every 8 hours 09/06/16 1103 09/07/16 1942   09/06/16 1400  vancomycin (VANCOCIN) IVPB 1000 mg/200 mL premix  Status:  Discontinued     1,000 mg 200  mL/hr over 60 Minutes Intravenous Every 12 hours 09/06/16 1122 09/06/16 1552   09/06/16 0200  vancomycin (VANCOCIN) IVPB 750 mg/150 ml premix  Status:  Discontinued     750 mg 150 mL/hr over 60 Minutes Intravenous Every 12 hours 09/05/16 1621 09/06/16 1122   09/05/16 1530  vancomycin (VANCOCIN) 1,500 mg in sodium chloride 0.9 % 500 mL IVPB     1,500 mg 250 mL/hr over 120 Minutes Intravenous  Once 09/05/16 1518 09/05/16 1741   09/05/16 1045  clindamycin (CLEOCIN) capsule 300 mg     300 mg Oral  Once 09/05/16 1033 09/05/16 1044      Scheduled Meds: . buPROPion  300 mg Oral Daily  . DULoxetine  60 mg Oral Daily  . ferrous sulfate  325 mg Oral BID  . gabapentin  800 mg Oral QID  . insulin aspart  0-15 Units Subcutaneous TID WC  . insulin aspart  0-5 Units Subcutaneous QHS  . levothyroxine  150 mcg Oral QAC breakfast  . metoprolol tartrate  25 mg Oral BID  . mirabegron ER  25 mg Oral Daily  . mometasone-formoterol  2 puff Inhalation BID  . montelukast  10 mg Oral QHS  . nystatin  5 mL Oral QID  . oxybutynin  10 mg Oral QHS  . pantoprazole  40 mg Oral Daily  . predniSONE  50 mg Oral Q breakfast  . protein supplement shake  11 oz Oral Q24H  . quinapril  20 mg Oral Daily   Continuous Infusions: . cefTRIAXone (ROCEPHIN) 1 g IVPB Stopped (09/07/16 2224)  . vancomycin Stopped (09/08/16 0516)   PRN Meds:.ALPRAZolam, HYDROcodone-acetaminophen, ipratropium-albuterol, loratadine, morphine injection, ondansetron (ZOFRAN) IV, senna-docusate  Allergies  Allergen Reactions  . Penicillins Hives, Shortness Of Breath and Swelling    Facial swelling Has patient had a PCN reaction causing immediate rash, facial/tongue/throat swelling, SOB or lightheadedness with hypotension: Yes Has patient had a PCN reaction causing severe rash involving mucus membranes or skin necrosis: Yes Has patient had a PCN reaction that required hospitalization No Has patient had a PCN reaction occurring within the last  10 years: No If all of the above answers are "NO", then may proceed with Cephalosporin use.   . Aspirin Hives  . Terramycin [Oxytetracycline] Hives    Physical Exam  Vitals  Blood pressure (!) 167/129, pulse (!) 54, temperature 98.1 F (36.7 C), temperature source Oral, resp. rate 20, height 5\' 5"  (1.651 m), weight 119.7 kg (264 lb), SpO2 100 %.  Lower Extremity exam:Wound is examined today packing was removed bluntly with minimal purulence. The incision margins are intact there is some exposed tissue that shows exposed tendon the area but is due to damage around that region due to the infection and damage to the soft tissue. Represents about a 1 cm area. Other portions incision looked to be okay. Overall cellulitis has improved some. White count was also reduced down 7.5 from 10.8 Data Review  CBC  Recent Labs Lab 09/05/16 0933 09/06/16 0424 09/08/16 0351  WBC 10.8 10.7 7.5  HGB 12.0 12.4 11.2*  HCT 35.9 37.5 33.3*  PLT 220 185 221  MCV 82.7 83.0 83.9  MCH 27.7  27.4 28.3  MCHC 33.5 33.0 33.7  RDW 19.3* 19.2* 18.5*  LYMPHSABS  --   --  0.3*  MONOABS  --   --  0.4  EOSABS  --   --  0.0  BASOSABS  --   --  0.0   ------------------------------------------------------------------------------------------------------------------  Chemistries   Recent Labs Lab 09/05/16 0933 09/06/16 0424 09/08/16 0351  NA 134* 135 136  K 4.8 5.1 5.3*  CL 100* 103 100*  CO2 26 26 29   GLUCOSE 190* 185* 232*  BUN 22* 14 21*  CREATININE 1.01* 0.89 0.71  CALCIUM 9.0 8.6* 8.9   ------------------------------------------------------------------------------------------------------------------ estimated creatinine clearance is 88.4 mL/min (by C-G formula based on SCr of 0.71 mg/dL). ------------------------------------------------------------------------------------------------------------------ No results for input(s): TSH, T4TOTAL, T3FREE, THYROIDAB in the last 72 hours.  Invalid  input(s): FREET3   Coagulation profile No results for input(s): INR, PROTIME in the last 168 hours. ------------------------------------------------------------------------------------------------------------------- No results for input(s): DDIMER in the last 72 hours. -------------------------------------------------------------------------------------------------------------------  Cardiac Enzymes  Recent Labs Lab 09/05/16 0933 09/05/16 1219  TROPONINI <0.03 <0.03   ------------------------------------------------------------------------------------------------------------------ Invalid input(s): POCBNP   ---------------------------------------------------------------------------------------------------------------  Urinalysis    Component Value Date/Time   COLORURINE YELLOW (A) 09/05/2016 0933   APPEARANCEUR CLEAR (A) 09/05/2016 0933   APPEARANCEUR Clear 11/09/2014 1100   LABSPEC 1.014 09/05/2016 0933   PHURINE 5.0 09/05/2016 0933   GLUCOSEU NEGATIVE 09/05/2016 0933   HGBUR NEGATIVE 09/05/2016 0933   BILIRUBINUR NEGATIVE 09/05/2016 0933   BILIRUBINUR Negative 11/09/2014 1100   KETONESUR NEGATIVE 09/05/2016 0933   PROTEINUR NEGATIVE 09/05/2016 0933   UROBILINOGEN 0.2 09/21/2014 1613   NITRITE NEGATIVE 09/05/2016 0933   LEUKOCYTESUR NEGATIVE 09/05/2016 0933   LEUKOCYTESUR Negative 11/09/2014 1100     Imaging results:   Mr Ankle Left W Wo Contrast  Result Date: 09/06/2016 CLINICAL DATA:  Pain, redness and swelling involving the left ankle area since Friday. EXAM: MRI OF THE LEFT ANKLE WITHOUT AND WITH CONTRAST TECHNIQUE: Multiplanar, multisequence MR imaging of the ankle was performed before and after the administration of intravenous contrast. CONTRAST:  30mL MULTIHANCE GADOBENATE DIMEGLUMINE 529 MG/ML IV SOLN COMPARISON:  None. FINDINGS: Examination is limited by patient motion. TENDONS Peroneal: Intact. Posteromedial: Intact. Anterior: Intact. Achilles: Mild  tendinopathy but no tear. Plantar Fascia: Intact. LIGAMENTS Lateral: Intact Medial: Intact CARTILAGE Ankle Joint: Mild degenerative changes but no full-thickness chondral defect or osteochondral lesion. Small joint effusion. Subtalar Joints/Sinus Tarsi: Moderate subtalar joint degenerative changes with subchondral cysts. There is also invagination of vascular tissue into the calcaneus along the posterior margin of the sinus tarsi E. there is a small amount of fluid and edema in the sinus tarsi E but the cervical and interosseous ligaments are grossly intact. Bones: No stress fracture or AVN. Moderate midfoot degenerative changes. No findings to suggest septic arthritis or osteomyelitis. Other: Subcutaneous soft tissue swelling/ edema suggesting cellulitis. Diffuse fatty atrophy of the foot and ankle musculature but no findings for myofasciitis or pyomyositis. IMPRESSION: IMPRESSION 1. Diffuse subcutaneous soft tissue swelling/edema/fluid suggesting cellulitis. No findings for focal soft tissue abscess, pyomyositis, septic arthritis or osteomyelitis. 2. Midfoot and hindfoot degenerative changes and mild ankle joint degenerative changes. No stress fracture or AVN. 3. Intact medial and lateral ankle ligaments and tendons. Electronically Signed   By: Marijo Sanes M.D.   On: 09/06/2016 16:49    Assessment & Plan: Overall the cellulitis is improved some wound looks fairly clean. Packing removed today. Plan: Remove packing redressed with wet-to-dry saline dressing. I will let her start  to get up and transferred to the chair maybe get some bathroom privileges with a walker. She'll need a postop shoe also. She may be better suited to get her rehabilitation for her care await Dr. Ola Spurr evaluation regarding whether she needs continued IV antibiotics or oral antibiotics. We will see her again tomorrow change dressing and reevaluate.  Active Problems:   Paroxysmal atrial fibrillation (HCC); CHA2DS2-Vasc Score 5. On  Pradaxa   Essential hypertension   Peripheral vascular disease due to secondary diabetes mellitus (HCC)   Apnea, sleep   Diabetic peripheral neuropathy associated with type 2 diabetes mellitus (HCC)   Asthma, moderate persistent, well-controlled   Hypothyroidism, adult   Anxiety and depression   Chronic prescription benzodiazepine use   Chronic pain syndrome   Long term current use of opiate analgesic   Cellulitis     Family Communication: Plan discussed with patient    Perry Mount M.D on 09/08/2016 at 12:53 PM  Thank you for the consult, we will follow the patient with you in the Hospital.

## 2016-09-08 NOTE — Progress Notes (Signed)
[place] INFECTIOUS DISEASE PROGRESS NOTE Date of Admission:  09/05/2016     ID: [name] is a 66 y.o. female with LLE cellulitis and abscess Active Problems:   Paroxysmal atrial fibrillation ([place]); CHA2DS2-Vasc Score 5. On Pradaxa   Essential hypertension   Peripheral vascular disease due to secondary diabetes mellitus ([place])   Apnea, sleep   Diabetic peripheral neuropathy associated with type 2 diabetes mellitus (HCC)   Asthma, moderate persistent, well-controlled   Hypothyroidism, adult   Anxiety and depression   Chronic prescription benzodiazepine use   Chronic pain syndrome   Long term current use of opiate analgesic   Cellulitis   Subjective: No fevers, less pain.   ROS  Eleven systems are reviewed and negative except per hpi  Medications:  Antibiotics Given (last 72 hours)    Date/Time Action Medication Dose Rate   09/05/16 1541 New Bag/Given   vancomycin (VANCOCIN) 1,500 mg in sodium chloride 0.9 % 500 mL IVPB 1,500 mg 250 mL/hr   09/06/16 0157 New Bag/Given   vancomycin (VANCOCIN) IVPB 750 mg/150 ml premix 750 mg 150 mL/hr   09/06/16 1653 New Bag/Given   meropenem (MERREM) 1 g in sodium chloride 0.9 % 100 mL IVPB 1 g 200 mL/hr   09/06/16 1654 New Bag/Given   vancomycin (VANCOCIN) IVPB 1000 mg/200 mL premix 1,000 mg 200 mL/hr   09/06/16 2141 New Bag/Given   meropenem (MERREM) 1 g in sodium chloride 0.9 % 100 mL IVPB 1 g 200 mL/hr   09/07/16 0332 New Bag/Given   vancomycin (VANCOCIN) IVPB 1000 mg/200 mL premix 1,000 mg 200 mL/hr   09/07/16 0541 New Bag/Given   meropenem (MERREM) 1 g in sodium chloride 0.9 % 100 mL IVPB 1 g 200 mL/hr   09/07/16 1643 New Bag/Given   vancomycin (VANCOCIN) IVPB 1000 mg/200 mL premix 1,000 mg 200 mL/hr   09/07/16 2154 New Bag/Given   cefTRIAXone (ROCEPHIN) 1 g in dextrose 5 % 50 mL IVPB 1 g 120 mL/hr   09/08/16 0416 New Bag/Given   vancomycin (VANCOCIN) IVPB 1000 mg/200 mL premix 1,000 mg 200 mL/hr     . buPROPion  300  mg Oral Daily  . DULoxetine  60 mg Oral Daily  . ferrous sulfate  325 mg Oral BID  . gabapentin  800 mg Oral QID  . insulin aspart  0-15 Units Subcutaneous TID WC  . insulin aspart  0-5 Units Subcutaneous QHS  . levothyroxine  150 mcg Oral QAC breakfast  . [START ON 09/09/2016] metFORMIN  1,000 mg Oral Q breakfast  . metFORMIN  500 mg Oral Q supper  . metoprolol tartrate  25 mg Oral BID  . mirabegron ER  25 mg Oral Daily  . mometasone-formoterol  2 puff Inhalation BID  . montelukast  10 mg Oral QHS  . nystatin  5 mL Oral QID  . oxybutynin  10 mg Oral QHS  . pantoprazole  40 mg Oral Daily  . protein supplement shake  11 oz Oral Q24H  . quinapril  20 mg Oral Daily    Objective: Vital signs in last 24 hours: Temp:  [98.1 F (36.7 C)] 98.1 F (36.7 C) (06/19 1217) Pulse Rate:  [54-73] 54 (06/19 1217) Resp:  [18-22] 20 (06/19 1217) BP: (101-171)/(70-129) 167/129 (06/19 1217) SpO2:  [94 %-100 %] 100 % (06/19 1217) Constitutional:  oriented to person, place, and time.obese, morbidly  HENT: [place]/AT, PERRLA, no scleral icterus Mouth/Throat: Oropharynx is clear and moist. No oropharyngeal exudate.  Cardiovascular: Normal  rate, regular rhythm and normal heart sounds. Exam reveals no gallop and no friction rub.  No murmur heard.  Pulmonary/Chest: Effort normal and breath sounds normal. No respiratory distress.  has no wheezes.  Neck = supple, no nuchal rigidity Abdominal: Soft. Bowel sounds are normal.  exhibits no distension. There is no tenderness.  Lymphadenopathy: no cervical adenopathy. No axillary adenopathy Neurological: alert and oriented to person, place, and time.  Skin: LLE had much improved erythema. The incision is well coapted and intact, min ss drainage RLE with chronic venous stasis changes Psychiatric: a normal mood and affect.  behavior is normal.   Lab Results  Recent Labs  09/06/16 0424 09/08/16 0351  WBC 10.7 7.5  HGB 12.4 11.2*  HCT 37.5 33.3*  NA 135 136   K 5.1 5.3*  CL 103 100*  CO2 26 29  BUN 14 21*  CREATININE 0.89 0.71    Microbiology: Results for orders placed or performed during the hospital encounter of 09/05/16  Culture, blood (Routine X 2) w Reflex to ID Panel     Status: None (Preliminary result)   Collection Time: 09/05/16  3:47 PM  Result Value Ref Range Status   Specimen Description BLOOD BLOOD RIGHT WRIST  Final   Special Requests   Final    BOTTLES DRAWN AEROBIC AND ANAEROBIC Blood Culture adequate volume   Culture NO GROWTH 3 DAYS  Final   Report Status PENDING  Incomplete  Culture, blood (Routine X 2) w Reflex to ID Panel     Status: None (Preliminary result)   Collection Time: 09/05/16  3:47 PM  Result Value Ref Range Status   Specimen Description BLOOD BLOOD LEFT ARM  Final   Special Requests   Final    BOTTLES DRAWN AEROBIC AND ANAEROBIC Blood Culture adequate volume   Culture NO GROWTH 3 DAYS  Final   Report Status PENDING  Incomplete  MRSA PCR Screening     Status: None   Collection Time: 09/07/16 10:12 AM  Result Value Ref Range Status   MRSA by PCR NEGATIVE NEGATIVE Final    Comment:        The GeneXpert MRSA Assay (FDA approved for NASAL specimens only), is one component of a comprehensive MRSA colonization surveillance program. It is not intended to diagnose MRSA infection nor to guide or monitor treatment for MRSA infections.   Aerobic/Anaerobic Culture (surgical/deep wound)     Status: None (Preliminary result)   Collection Time: 09/07/16  1:08 PM  Result Value Ref Range Status   Specimen Description ABSCESS LEFT HEEL ABSCESS  Final   Special Requests NONE  Final   Gram Stain   Final    ABUNDANT WBC PRESENT,BOTH PMN AND MONONUCLEAR RARE GRAM POSITIVE COCCI IN PAIRS IN SINGLES    Culture   Final    FEW STAPHYLOCOCCUS AUREUS SUSCEPTIBILITIES TO FOLLOW Performed at [facility], [place] [address], [place], [place] [address]    Report Status PENDING  Incomplete      Studies/Results: Mr Ankle Left W Wo Contrast  Result Date: 09/06/2016 CLINICAL DATA:  Pain, redness and swelling involving the left ankle area since Friday. EXAM: MRI OF THE LEFT ANKLE WITHOUT AND WITH CONTRAST TECHNIQUE: Multiplanar, multisequence MR imaging of the ankle was performed before and after the administration of intravenous contrast. CONTRAST:  58mL MULTIHANCE GADOBENATE DIMEGLUMINE 529 MG/ML IV SOLN COMPARISON:  None. FINDINGS: Examination is limited by patient motion. TENDONS Peroneal: Intact. Posteromedial: Intact. Anterior: Intact. Achilles: Mild tendinopathy but  no tear. Plantar Fascia: Intact. LIGAMENTS Lateral: Intact Medial: Intact CARTILAGE Ankle Joint: Mild degenerative changes but no full-thickness chondral defect or osteochondral lesion. Small joint effusion. Subtalar Joints/Sinus Tarsi: Moderate subtalar joint degenerative changes with subchondral cysts. There is also invagination of vascular tissue into the calcaneus along the posterior margin of the sinus tarsi E. there is a small amount of fluid and edema in the sinus tarsi E but the cervical and interosseous ligaments are grossly intact. Bones: No stress fracture or AVN. Moderate midfoot degenerative changes. No findings to suggest septic arthritis or osteomyelitis. Other: Subcutaneous soft tissue swelling/ edema suggesting cellulitis. Diffuse fatty atrophy of the foot and ankle musculature but no findings for myofasciitis or pyomyositis. IMPRESSION: IMPRESSION 1. Diffuse subcutaneous soft tissue swelling/edema/fluid suggesting cellulitis. No findings for focal soft tissue abscess, pyomyositis, septic arthritis or osteomyelitis. 2. Midfoot and hindfoot degenerative changes and mild ankle joint degenerative changes. No stress fracture or AVN. 3. Intact medial and lateral ankle ligaments and tendons. Electronically Signed   By: Marijo Sanes M.D.   On: 09/06/2016 16:49    Assessment/Plan: CHRISTALYN GOERTZ is a 66 y.o. female  with L leg cellulitis and abscess collection near achilles tendon sheath now s/p I and D 6/18 with cultures Staph aureus (sensis Pending) . MRI showed no osteomyelitis. MRSA PCR neg. PCN allergic.  Cellulitis regressing and wound intact.  I think this can be treated with oral abx once we see sensitivities - likely will be MSSA since MRSA PCR negative.   Recommendations Continue vanco Can dc ceftriaxone  WIll base final abx recs on culture results but likely can be treated for this skin and soft tissue infection with oral abx. - likley keflex which she has tolerated in past. Would plan on 2 week course with close fu. Thank you very much for the consult. Will follow with you.  Malita Ignasiak P   09/08/2016, 3:33 PM

## 2016-09-08 NOTE — Progress Notes (Signed)
Pharmacy Antibiotic Note  [name] is a 66 y.o. female admitted on 09/05/2016 with cellulitis of LLE with possible abscess to posterior ankle.  Pharmacy was consulted for vancomycin 6/17, now consulted for meropenem. Patient received vancomycin 1500mg  X 1 dose in ED on 6/17.   Patient does report PCN allergy that includes hives, SOB, and swelling. MD is aware.   Plan: NEW: DW: 78Kg    Ke: 0.070   T1/2: 9.9   Vd: 57  Will increase  Vancomycin to  1g IV every 12 hours due to abscess. Calculated trough at Css is 15. Trough ordered prior to 5th dose. Expect some accumulation. Monitor renal function and adjust dose as needed.   6/19 @ 0400 VT 17 therapeutic. Drawn 45 minutes too early from prior dose given but level still within range. Will continue current regimen and recheck VT 6/20 @ 0330.  Will start meropenem 1g IV every 8 hours. Will discuss with nurse patients PMH of allergy to PCN and S/Sx to look out for during administration.   6/18: ID consulted. D/c meropenem, start Ceftriaxone 1 gram IV Q24h.   Height: 5\' 5"  (165.1 cm) Weight: 264 lb (119.7 kg) IBW/kg (Calculated) : 57  Temp (24hrs), Avg:97.8 F (36.6 C), Min:97.3 F (36.3 C), Max:98.1 F (36.7 C)   Recent Labs Lab 09/05/16 0933 09/06/16 0424 09/08/16 0351  WBC 10.8 10.7 7.5  CREATININE 1.01* 0.89 0.71  VANCOTROUGH  --   --  17    Estimated Creatinine Clearance: 88.4 mL/min (by C-G formula based on SCr of 0.71 mg/dL).    Allergies  Allergen Reactions  . Penicillins Hives, Shortness Of Breath and Swelling    Facial swelling Has patient had a PCN reaction causing immediate rash, facial/tongue/throat swelling, SOB or lightheadedness with hypotension: Yes Has patient had a PCN reaction causing severe rash involving mucus membranes or skin necrosis: Yes Has patient had a PCN reaction that required hospitalization No Has patient had a PCN reaction occurring within the last 10 years: No If all of the above  answers are "NO", then may proceed with Cephalosporin use.   . Aspirin Hives  . Terramycin [Oxytetracycline] Hives    Antimicrobials this admission: 6/16 vancomycin >>  6/17 meropenem >> 6/18 CTX 6/18 >>  Dose adjustments this admission:   Microbiology results:  Thank you for allowing pharmacy to be a part of this patient's care.  Tobie Lords, PharmD, BCPS Clinical Pharmacist 09/08/2016 6:08 AM

## 2016-09-08 NOTE — Anesthesia Postprocedure Evaluation (Signed)
Anesthesia Post Note  Patient: [name]  Procedure(s) Performed: Procedure(s) (LRB): IRRIGATION AND DEBRIDEMENT ABSCESS LEFT HEEL (Left)  Patient location during evaluation: PACU Anesthesia Type: Regional and General Level of consciousness: awake and alert Pain management: pain level controlled Vital Signs Assessment: post-procedure vital signs reviewed and stable Respiratory status: spontaneous breathing, nonlabored ventilation, respiratory function stable and patient connected to nasal cannula oxygen Cardiovascular status: blood pressure returned to baseline and stable Postop Assessment: no signs of nausea or vomiting Anesthetic complications: no     Last Vitals:  Vitals:   09/08/16 0600 09/08/16 1217  BP: 130/90 (!) 167/129  Pulse:  (!) 54  Resp:  20  Temp:  36.7 C    Last Pain:  Vitals:   09/08/16 1217  TempSrc: Oral  PainSc:                  Martha Clan

## 2016-09-08 NOTE — NC FL2 (Signed)
[place] LEVEL OF CARE SCREENING TOOL     IDENTIFICATION  Patient Name: [name] Birthdate: 06/12/1950 Sex: female Admission Date (Current Location): 09/05/2016  [facility] and [place] Number:  Engineering geologist and Address:  [facility], [address] [place], [place] [address]      Provider Number: [id number]  Attending Physician Name and Address:  Fritzi Mandes, MD  Relative Name and Phone Number:       Current Level of Care: Hospital Recommended Level of Care: [place] Prior Approval Number:    Date Approved/Denied:   PASRR Number:    Discharge Plan: SNF    Current Diagnoses: Patient Active Problem List   Diagnosis Date Noted  . Cellulitis 09/05/2016  . Shortness of breath 08/06/2016  . Upper respiratory tract infection 06/09/2016  . Low oxygen saturation 06/09/2016  . Chronic pain syndrome 02/25/2016  . Long term current use of opiate analgesic 02/25/2016  . Chronic right shoulder pain 02/25/2016  . DOE (dyspnea on exertion) 11/06/2015  . Status post reverse total shoulder replacement 10/08/2015  . 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 Morbid obesity with BMI of 40.0-44.9, adult ([place]) 09/23/2015  . Chronic prescription benzodiazepine use 09/23/2015  . Osteoarthritis, multiple sites 09/23/2015  . Other shoulder lesions (Right) 08/28/2015  . Opiate use (15 MME/Day) 01/16/2015  . Lateral femoral cutaneous entrapment syndrome (Right) 01/16/2015  . Mixed incontinence 11/26/2014  . Atrophic vaginitis 11/26/2014  . Anxiety and depression 09/21/2014  . Bladder cystocele 09/21/2014  . Gastro-esophageal reflux disease without esophagitis 09/21/2014  . DM (diabetes mellitus) type II controlled peripheral vascular disorder ([place]) 08/31/2014  . Diabetic peripheral neuropathy associated with type 2 diabetes mellitus ([place]) 08/31/2014  . Asthma, moderate persistent, well-controlled  08/31/2014  . Hypothyroidism, adult 08/31/2014  . Peripheral vascular disease due to secondary diabetes mellitus ([place]) 12/11/2013  . Apnea, sleep 10/19/2013  . Paroxysmal atrial fibrillation ([place]); CHA2DS2-Vasc Score 5. On Pradaxa 07/27/2013  . Essential hypertension 07/27/2013    Orientation RESPIRATION BLADDER Height & Weight     Self, Time, Situation, Place   (cpap at night) Incontinent Weight: 264 lb (119.7 kg) Height:  5\' 5"  (165.1 cm)  BEHAVIORAL SYMPTOMS/MOOD NEUROLOGICAL BOWEL NUTRITION STATUS   (none)  (none) Continent Diet (carb modified)  AMBULATORY STATUS COMMUNICATION OF NEEDS Skin   Extensive Assist Verbally  (cellulitis with abscess on lower extremity)                       Personal Care Assistance Level of Assistance  Bathing, Dressing Bathing Assistance: Limited assistance   Dressing Assistance: Limited assistance     Functional Limitations Info   (no issues)          SPECIAL CARE FACTORS FREQUENCY  PT (By licensed PT)                    Contractures Contractures Info: Not present    Additional Factors Info  Code Status, Allergies Code Status Info: full Allergies Info: pcns, asa, terramycin           Current Medications (09/08/2016):  This is the current hospital active medication list Current Facility-Administered Medications  Medication Dose Route Frequency Provider Last Rate Last Dose  . ALPRAZolam Duanne Moron) tablet 0.5 mg  0.5 mg Oral BID PRN Quintella Baton, MD   0.5 mg at 09/08/16 0403  . buPROPion (WELLBUTRIN XL) 24 hr tablet 300 mg  300  mg Oral Daily Quintella Baton, MD   300 mg at 09/08/16 1227  . DULoxetine (CYMBALTA) DR capsule 60 mg  60 mg Oral Daily Crosley, Debby, MD   60 mg at 09/08/16 1039  . ferrous sulfate tablet 325 mg  325 mg Oral BID Quintella Baton, MD   325 mg at 09/08/16 1039  . gabapentin (NEURONTIN) capsule 800 mg  800 mg Oral QID Quintella Baton, MD   800 mg at 09/08/16 1548  . HYDROcodone-acetaminophen  (NORCO/VICODIN) 5-325 MG per tablet 1 tablet  1 tablet Oral Q6H PRN Lance Coon, MD   1 tablet at 09/08/16 1042  . insulin aspart (novoLOG) injection 0-15 Units  0-15 Units Subcutaneous TID WC Quintella Baton, MD   5 Units at 09/08/16 1226  . insulin aspart (novoLOG) injection 0-5 Units  0-5 Units Subcutaneous QHS Quintella Baton, MD   2 Units at 09/07/16 2148  . ipratropium-albuterol (DUONEB) 0.5-2.5 (3) MG/3ML nebulizer solution 3 mL  3 mL Nebulization Q4H PRN Crosley, Debby, MD      . levothyroxine (SYNTHROID, LEVOTHROID) tablet 150 mcg  150 mcg Oral QAC breakfast Quintella Baton, MD   150 mcg at 09/08/16 0832  . loratadine (CLARITIN) tablet 10 mg  10 mg Oral Daily PRN Quintella Baton, MD      . Derrill Memo ON 09/09/2016] metFORMIN (GLUCOPHAGE) tablet 1,000 mg  1,000 mg Oral Q breakfast Fritzi Mandes, MD      . metFORMIN (GLUCOPHAGE) tablet 500 mg  500 mg Oral Q supper Fritzi Mandes, MD      . metoprolol tartrate (LOPRESSOR) tablet 25 mg  25 mg Oral BID Claria Dice, Debby, MD   25 mg at 09/08/16 1058  . mirabegron ER (MYRBETRIQ) tablet 25 mg  25 mg Oral Daily Crosley, Debby, MD   25 mg at 09/08/16 0636  . mometasone-formoterol (DULERA) 200-5 MCG/ACT inhaler 2 puff  2 puff Inhalation BID Quintella Baton, MD   2 puff at 09/08/16 0833  . montelukast (SINGULAIR) tablet 10 mg  10 mg Oral QHS Crosley, Debby, MD   10 mg at 09/07/16 2149  . morphine 2 MG/ML injection 2 mg  2 mg Intravenous Q4H PRN Hillary Bow, MD   2 mg at 09/07/16 0834  . nystatin (MYCOSTATIN) 100000 UNIT/ML suspension 500,000 Units  5 mL Oral QID Hillary Bow, MD   500,000 Units at 09/08/16 1548  . ondansetron (ZOFRAN) injection 4 mg  4 mg Intravenous Q6H PRN Hillary Bow, MD   4 mg at 09/06/16 1535  . oxybutynin (DITROPAN-XL) 24 hr tablet 10 mg  10 mg Oral QHS Crosley, Debby, MD   10 mg at 09/07/16 2148  . pantoprazole (PROTONIX) EC tablet 40 mg  40 mg Oral Daily Crosley, Debby, MD   40 mg at 09/08/16 1058  . protein supplement (PREMIER  PROTEIN) liquid  11 oz Oral Q24H Sudini, Alveta Heimlich, MD   11 oz at 09/08/16 1102  . quinapril (ACCUPRIL) tablet 20 mg  20 mg Oral Daily Crosley, Debby, MD   20 mg at 09/08/16 1058  . senna-docusate (Senokot-S) tablet 1 tablet  1 tablet Oral QHS PRN Crosley, Debby, MD      . vancomycin (VANCOCIN) IVPB 1000 mg/200 mL premix  1,000 mg Intravenous Q12H Hallaji, Sheema M, RPH 200 mL/hr at 09/08/16 1548 1,000 mg at 09/08/16 1548     Discharge Medications: Please see discharge summary for a list of discharge medications.  Relevant Imaging Results:  Relevant Lab Results:   Additional Information ss: [id number]  [facility]  Nicholaus Corolla, LCSW

## 2016-09-08 NOTE — Clinical Social Work Note (Signed)
Clinical Social Work Assessment  Patient Details  Name: [name] MRN: [id number] Date of Birth: 01-10-1951  Date of referral:  09/08/16               Reason for consult:  Facility Placement                Permission sought to share information with:  Chartered certified accountant granted to share information::  Yes, Verbal Permission Granted  Name::        Agency::     Relationship::     Contact Information:     Housing/Transportation Living arrangements for the past 2 months:  Single Family Home Source of Information:  Patient Patient Interpreter Needed:  None Criminal Activity/Legal Involvement Pertinent to Current Situation/Hospitalization:  No - Comment as needed Significant Relationships:  Spouse Lives with:  Spouse Do you feel safe going back to the place where you live?  Yes Need for family participation in patient care:  No (Coment)  Care giving concerns:  Patient resides at home with her husband who is in poor health with back issues and cannot do much.   Social Worker assessment / plan:  PT assessed patient and have recommended STR. CSW spoke with patient this afternoon and introduced self and stated purpose of visit. Patient was very pleasant and talkative. Patient stated that she might not want to do rehab after all and stated that if she requires IV antibiotics, she will choose rehab. Patient states she has been to [facility] for rehab in the past. Patient gave permission for CSW to conduct a bed search.   Employment status:  Retired Forensic scientist:  Managed Care PT Recommendations:  [place] / Referral to community resources:     Patient/Family's Response to care:  Patient expressed appreciation for CSW assistance.  Patient/Family's Understanding of and Emotional Response to Diagnosis, Current Treatment, and Prognosis:  Patient is aware of her limitations and believes she may require rehab but is wanting to wait  for her cultures to return to know for sure.  Emotional Assessment Appearance:  Appears stated age Attitude/Demeanor/Rapport:   (pleasant and cooperative) Affect (typically observed):  Accepting, Adaptable, Calm, Pleasant Orientation:  Oriented to Self, Oriented to Place, Oriented to Situation, Oriented to  Time Alcohol / Substance use:  Not Applicable Psych involvement (Current and /or in the community):  No (Comment)  Discharge Needs  Concerns to be addressed:  Care Coordination Readmission within the last 30 days:  No Current discharge risk:  None Barriers to Discharge:  No Barriers Identified   Shela Leff, LCSW 09/08/2016, 4:40 PM

## 2016-09-08 NOTE — Progress Notes (Signed)
[facility] [place] NAME: [name]    MR#:  [id number]  DATE OF BIRTH:  1950-09-03  SUBJECTIVE:  Complains of pain left foot. POD # 1 REVIEW OF SYSTEMS:    Review of Systems  Constitutional: Positive for malaise/fatigue. Negative for chills and fever.  HENT: Negative for sore throat.   Eyes: Negative for blurred vision, double vision and pain.  Respiratory: Negative for cough, hemoptysis, shortness of breath and wheezing.   Cardiovascular: Negative for chest pain, palpitations, orthopnea and leg swelling.  Gastrointestinal: Negative for abdominal pain, constipation, diarrhea, heartburn, nausea and vomiting.  Genitourinary: Negative for dysuria and hematuria.  Musculoskeletal: Positive for joint pain. Negative for back pain.  Skin: Negative for rash.  Neurological: Positive for weakness. Negative for sensory change, speech change, focal weakness and headaches.  Endo/Heme/Allergies: Does not bruise/bleed easily.  Psychiatric/Behavioral: Negative for depression. The patient is not nervous/anxious.     DRUG ALLERGIES:   Allergies  Allergen Reactions  . Penicillins Hives, Shortness Of Breath and Swelling    Facial swelling Has patient had a PCN reaction causing immediate rash, facial/tongue/throat swelling, SOB or lightheadedness with hypotension: Yes Has patient had a PCN reaction causing severe rash involving mucus membranes or skin necrosis: Yes Has patient had a PCN reaction that required hospitalization No Has patient had a PCN reaction occurring within the last 10 years: No If all of the above answers are "NO", then may proceed with Cephalosporin use.   . Aspirin Hives  . Terramycin [Oxytetracycline] Hives    VITALS:  Blood pressure (!) 167/129, pulse (!) 54, temperature 98.1 F (36.7 C), temperature source Oral, resp. rate 20, height 5\' 5"  (1.651 m), weight 119.7 kg (264 lb), SpO2 100 %.  PHYSICAL EXAMINATION:    Physical Exam  GENERAL:  66 y.o.-year-old patient lying in the bed with no acute distress.  EYES: Pupils equal, round, reactive to light and accommodation. No scleral icterus. Extraocular muscles intact.  HEENT: Head atraumatic, normocephalic. Oropharynx and nasopharynx clear.  NECK:  Supple, no jugular venous distention. No thyroid enlargement, no tenderness.  LUNGS: Bilateral wheezing.  CARDIOVASCULAR: S1, S2 normal. No murmurs, rubs, or gallops.  ABDOMEN: Soft, nontender, nondistended. Bowel sounds present. No organomegaly or mass.  EXTREMITIES: Left leg post surgical dressing + NEUROLOGIC: Cranial nerves II through XII are intact. No focal Motor or sensory deficits b/l.   PSYCHIATRIC: The patient is alert and oriented x 3.  SKIN: No obvious rash, lesion, or ulcer.   LABORATORY PANEL:   CBC  Recent Labs Lab 09/08/16 0351  WBC 7.5  HGB 11.2*  HCT 33.3*  PLT 221   ------------------------------------------------------------------------------------------------------------------ Chemistries   Recent Labs Lab 09/08/16 0351  NA 136  K 5.3*  CL 100*  CO2 29  GLUCOSE 232*  BUN 21*  CREATININE 0.71  CALCIUM 8.9   ------------------------------------------------------------------------------------------------------------------  Cardiac Enzymes  Recent Labs Lab 09/05/16 1219  TROPONINI <0.03   ------------------------------------------------------------------------------------------------------------------  RADIOLOGY:  No results found.   ASSESSMENT AND PLAN:   *Assessment/Plan Present on Admission:  . Cellulitis LLE with left abscess posterior ankle area - On vancomycin and meropenem---now on vanc and ceftriaxone - Consulted podiatry. Patient had MRI of the foot and ankle with no fluid collection. - s/p I and D of superficial fluid collection over the Achilles tendon. -Fluid cx positive for staph aureus  . Anxiety and depression On home  medications  . Apnea, sleep -CPAP QHS  . Acute Asthma exacerbation -  improving - prednisone - Nebulizers -Wean O2 as tolerated. Uses oxygen as needed at home  . Chronic pain syndrome -Stable, home medications resumed  . Diabetic peripheral neuropathy associated with type 2 diabetes mellitus (HCC) -ADA diet, sliding-scale insulin -Home medications resumed  . Essential hypertension -Stable, home medications resumed -PRN Blood pressure medication  . Hypothyroidism, adult -Stable, home medications resumed  . Paroxysmal atrial fibrillation ([place]); CHA2DS2-Vasc Score 5.  -On Pradaxa at home. Pradaxa held for surgery. Resume after surgery.  . Peripheral vascular disease due to secondary diabetes mellitus ([place]) -aware  PT recommends SNF  All the records are reviewed and case discussed with Care Management/Social Worker Management plans discussed with the patient, family and they are in agreement.  CODE STATUS: FULL CODE  DVT Prophylaxis: SCDs  TOTAL TIME TAKING CARE OF THIS PATIENT: 25 minutes.   POSSIBLE D/C IN 1-2 DAYS, DEPENDING ON CLINICAL CONDITION.  Zoraida Havrilla M.D on 09/08/2016 at 6:34 PM  Between 7am to 6pm - Pager - [phone]  After 6pm go to [email or web address] - password [email or web address] [facility]  Office  [phone]  CC: Primary care physician; [name], DO  Note: This dictation was prepared with Dragon dictation along with smaller phrase technology. Any transcriptional errors that result from this process are unintentional.

## 2016-09-08 NOTE — Evaluation (Signed)
Physical Therapy Evaluation Patient Details Name: [name] MRN: [id number] DOB: 1950-09-21 Today's Date: 09/08/2016   History of Present Illness  Pt is a 66 y/o F who presented with pain in LLE.  She is now s/p I&D of abscess of L achilles after being admitted with cellulitis to [facility].  Pt's PMH includes R ankle surgery, L TKA, R rTSA, R ulnar nerve transposition, depression, neuropathy BLEs, obesity, paroxysmal a-fib, PVD, venous stasis dermatitis BLEs.      Clinical Impression  Pt admitted with above diagnosis. Pt currently with functional limitations due to the deficits listed below (see PT Problem List).  [name] demonstrates significant unsteadiness and is impulsive with transfers.  She has unrealistic expectations given her current mobility status.  She had LOB when pivoting to the chair, resulting in her sitting quickly on the bed.  Despite this, she seems to have no concerns about going up 4 steps to enter her home with the assist of her husband who is unable to physically assist her.  Spoke with pt about the importance of ensuring safety and decreasing her risk of falling, and thus, the recommendation for SNF at d/c.  Pt will benefit from skilled PT to increase their independence and safety with mobility to allow discharge to the venue listed below.      Follow Up Recommendations SNF    Equipment Recommendations  Rolling walker with 5" wheels;Other (comment) (Bariatric RW (pt only has standard walker))    Recommendations for Other Services       Precautions / Restrictions Precautions Precautions: Fall Required Braces or Orthoses: Other Brace/Splint Other Brace/Splint: post op shoe Restrictions Weight Bearing Restrictions: Yes LLE Weight Bearing: Partial weight bearing (per phone conversatin with Dr. [name]) LLE Partial Weight Bearing Percentage or Pounds: 50 Other Position/Activity Restrictions: Up with bathroom privileges      Mobility  Bed Mobility Overal  bed mobility: Needs Assistance Bed Mobility: Supine to Sit     Supine to sit: Min guard;HOB elevated     General bed mobility comments: HOB elevated and pt uses bed rail to pull into sitting with increased time and effort.  Transfers Overall transfer level: Needs assistance Equipment used: Rolling walker (2 wheeled) Transfers: Sit to/from [facility] Sit to Stand: Min assist Stand pivot transfers: Min assist       General transfer comment: Pt requires min assist to steady and cues for proper hand placement with sit<>stand transfer.  Pt instructed to take her time and to slow down when pivoting but pt is impulsive, thus leading to LOB and pt sitting quickly back down on EOB.  Repeated stand pivot again with pt moving more cautiously but still unsteady and moves too quickly.   Ambulation/Gait             General Gait Details: Not safe to attempt at this time.  Stairs            Wheelchair Mobility    Modified Rankin (Stroke Patients Only)       Balance Overall balance assessment: Needs assistance;History of Falls Sitting-balance support: No upper extremity supported;Feet supported Sitting balance-Leahy Scale: Good     Standing balance support: Bilateral upper extremity supported;During functional activity Standing balance-Leahy Scale: Poor Standing balance comment: Unsteady and requires physical assist for static and dynamic activities with LOB with dynamic activity as documented above  Pertinent Vitals/Pain Pain Assessment: 0-10 Pain Score: 6  Pain Location: L ankle region Pain Descriptors / Indicators: Burning;Grimacing Pain Intervention(s): Limited activity within patient's tolerance;Monitored during session;Repositioned;Patient requesting pain meds-RN notified    Home Living Family/patient expects to be discharged to:: Private residence Living Arrangements: Spouse/significant other Available Help  at Discharge:  (husband unable to provide physical assist) Type of Home: House Home Access: Stairs to enter Entrance Stairs-Rails: Right Entrance Stairs-Number of Steps: 4 Home Layout: One level Home Equipment: Cane - single point;Bedside commode;Shower seat;Walker - standard Additional Comments: Pt has 4 steps in the front with a sidewalk leading to the steps and a R rail.  She has 4 steps in the back without a sidwalk but has Bil railings that she can reach at the same time.  Pt reports she cannot pull heavily with RUE due to h/o R ulnar nerve transposition.    Prior Function Level of Independence: Independent         Comments: Pt reports one fall in the past year when playing cornhole in sandals, falling posteriorly when her sandal slipped off.  She ambulates without AD.  She is independent with all aspects of mobility.     Hand Dominance        Extremity/Trunk Assessment   Upper Extremity Assessment Upper Extremity Assessment: RUE deficits/detail;LUE deficits/detail RUE Deficits / Details: Strength grossly 4-/5, h/o R ulnar nerve transposition LUE Deficits / Details: Strength grossly 4/5    Lower Extremity Assessment Lower Extremity Assessment: RLE deficits/detail;LLE deficits/detail RLE Deficits / Details: Strength grossly 4/5  LLE Deficits / Details: Did not formally test L ankle/foot, wrapped painful.  Strength otherwise grossly 4-/5       Communication   Communication: No difficulties  Cognition Arousal/Alertness: Awake/alert Behavior During Therapy: WFL for tasks assessed/performed;Impulsive (Moves quickly with transfers) Overall Cognitive Status: Within Functional Limits for tasks assessed                                        General Comments General comments (skin integrity, edema, etc.): BP 132/105 at start of session, RN made aware.  SpO2 91% or higher on RA with activity.    Exercises     Assessment/Plan    PT Assessment Patient  needs continued PT services  PT Problem List Decreased strength;Decreased range of motion;Decreased activity tolerance;Decreased balance;Decreased mobility;Decreased knowledge of use of DME;Decreased safety awareness;Decreased knowledge of precautions;Pain;Obesity       PT Treatment Interventions DME instruction;Gait training;Stair training;Functional mobility training;Therapeutic activities;Therapeutic exercise;Balance training;Patient/family education;Wheelchair mobility training    PT Goals (Current goals can be found in the Care Plan section)  Acute Rehab PT Goals Patient Stated Goal: to go home and become more independent PT Goal Formulation: With patient Time For Goal Achievement: 09/22/16 Potential to Achieve Goals: Fair    Frequency Min 2X/week   Barriers to discharge Inaccessible home environment;Decreased caregiver support Husband unable to provide physical assist and pt has 4 steps to enter home    Co-evaluation               AM-PAC PT "6 Clicks" Daily Activity  Outcome Measure Difficulty turning over in bed (including adjusting bedclothes, sheets and blankets)?: A Little Difficulty moving from lying on back to sitting on the side of the bed? : A Little Difficulty sitting down on and standing up from a chair with arms (e.g., wheelchair, bedside commode, etc,.)?: A  Little Help needed moving to and from a bed to chair (including a wheelchair)?: Total Help needed walking in hospital room?: A Lot Help needed climbing 3-5 steps with a railing? : Total 6 Click Score: 13    End of Session Equipment Utilized During Treatment: Gait belt Activity Tolerance: Patient limited by fatigue Patient left: in chair;with call bell/phone within reach;with chair alarm set Nurse Communication: Mobility status;Weight bearing status;Other (comment);Patient requests pain meds (SpO2, BP) PT Visit Diagnosis: Unsteadiness on feet (R26.81);Muscle weakness (generalized) (M62.81);Other  abnormalities of gait and mobility (R26.89);History of falling (Z91.81);Pain Pain - Right/Left: Left Pain - part of body: Ankle and joints of foot    Time: 6063-0160 PT Time Calculation (min) (ACUTE ONLY): 37 min   Charges:   PT Evaluation $PT Eval Low Complexity: 1 Procedure PT Treatments $Therapeutic Activity: 8-22 mins   PT G Codes:        Collie Siad PT, DPT 09/08/2016, 4:37 PM

## 2016-09-09 ENCOUNTER — Telehealth: Payer: Self-pay | Admitting: Family Medicine

## 2016-09-09 LAB — GLUCOSE, CAPILLARY
Glucose-Capillary: 154 mg/dL — ABNORMAL HIGH (ref 65–99)
Glucose-Capillary: 112 mg/dL — ABNORMAL HIGH (ref 65–99)
Glucose-Capillary: 119 mg/dL — ABNORMAL HIGH (ref 65–99)

## 2016-09-09 LAB — VANCOMYCIN, TROUGH: VANCOMYCIN TR: 22 ug/mL — AB (ref 15–20)

## 2016-09-09 MED ORDER — HYDROCODONE-ACETAMINOPHEN 5-325 MG PO TABS: 1.0000 | ORAL_TABLET | Freq: Four times a day (QID) | ORAL | 0 refills | Status: DC | PRN

## 2016-09-09 MED ORDER — HYDROCODONE-ACETAMINOPHEN 5-325 MG PO TABS
1.0000 | ORAL_TABLET | Freq: Four times a day (QID) | ORAL | Status: DC | PRN
  Administered 2016-09-09: 2 via ORAL
  Filled 2016-09-09: qty 2

## 2016-09-09 MED ORDER — CEPHALEXIN 500 MG PO CAPS
500.0000 mg | ORAL_CAPSULE | Freq: Three times a day (TID) | ORAL | Status: DC
  Administered 2016-09-09: 500 mg via ORAL
  Filled 2016-09-09 (×2): qty 1

## 2016-09-09 MED ORDER — VANCOMYCIN HCL IN DEXTROSE 750-5 MG/150ML-% IV SOLN
750.0000 mg | Freq: Two times a day (BID) | INTRAVENOUS | Status: DC
  Filled 2016-09-09: qty 150

## 2016-09-09 MED ORDER — CEPHALEXIN 500 MG PO CAPS: 500.0000 mg | ORAL_CAPSULE | Freq: Three times a day (TID) | ORAL | 0 refills | Status: AC

## 2016-09-09 NOTE — Progress Notes (Signed)
Patient Demographics  [name], is a 66 y.o. female   MRN: [id number]   DOB - 1951-01-18  Admit Date - 09/05/2016    Outpatient Primary MD for the patient is [name], DO  Consult requested in the Hospital by Fritzi Mandes, MD, On 09/09/2016  With History of -  Past Medical History:  Diagnosis Date  . Anxiety and depression   . Arthritis   . Asthma   . Cystocele   . Depression   . Diabetes ([place])   . DVT (deep venous thrombosis) (HCC)    Righ calf  . GERD (gastroesophageal reflux disease)   . Heart murmur    Echocardiogram June 2015: Mild MR (possible vegetation seen on TTE, not seen on TEE), normal LV size with moderate concentric LVH. Normal function EF 60-65%. Normal diastolic function. Mild LA dilation.  Marland Kitchen History of IBS   . History of methicillin resistant staphylococcus aureus (MRSA) 2008  . Hypothyroidism   . Insomnia   . Kidney stone    kidney stones with lithotripsy  . Neuropathy involving both lower extremities   . Obesity, Class II, BMI 35-39.9, with comorbidity   . Paroxysmal atrial fibrillation ([place]) 2007   In June 2015, Cardiac Event Monitor: Mostly SR/sinus arrhythmia with PVCs that are frequent. Short bursts of A. fib lasting several minutes;; CHA2DS2-VASc Score = 5 (age, Female, PVD, DM, HTN)  . Peripheral vascular disease ([place])   . Sleep apnea    use C-PAP  . Venous stasis dermatitis of both lower extremities       Past Surgical History:  Procedure Laterality Date  . ANKLE SURGERY Right   . APPLICATION OF WOUND VAC Left 06/27/2015   Procedure: APPLICATION OF WOUND VAC ( POSSIBLE ) ;  Surgeon: [name], MD;  Location: [facility] ORS;  Service: Vascular;  Laterality: Left;  . arm surgery     right    . CHOLECYSTECTOMY    . HERNIA REPAIR     umbilical  . HIATAL HERNIA REPAIR     . I&D EXTREMITY Left 06/27/2015   Procedure: IRRIGATION AND DEBRIDEMENT EXTREMITY            ( CALF HEMATOMA ) POSSIBLE WOUND VAC;  Surgeon: [name], MD;  Location: [facility] ORS;  Service: Vascular;  Laterality: Left;  . IRRIGATION AND DEBRIDEMENT ABSCESS Left 09/07/2016   Procedure: IRRIGATION AND DEBRIDEMENT ABSCESS LEFT HEEL;  Surgeon: [name], DPM;  Location: [facility] ORS;  Service: Podiatry;  Laterality: Left;  . JOINT REPLACEMENT     left knee replacement  . KIDNEY SURGERY Right    kidney stones  . LITHOTRIPSY    . NM [facility] HX)  February 2017   Likely breast attenuation. LOW RISK study. Normal EF 55-60%.  . removal of left hematoma Left    leg  . REVERSE SHOULDER ARTHROPLASTY Right 10/08/2015   Procedure: REVERSE SHOULDER ARTHROPLASTY;  Surgeon: [name], MD;  Location: [facility] ORS;  Service: Orthopedics;  Laterality: Right;  . SLEEVE GASTROPLASTY    . TONSILLECTOMY    . TOTAL KNEE ARTHROPLASTY    . TRANSESOPHAGEAL ECHOCARDIOGRAM  08/08/2013   Mild LVH, EF  60-65%. Moderate LA dilation and mild RA dilation. Mild MR with no evidence of stenosis and no evidence of endocarditis. A false chordae is noted.  . TRANSTHORACIC ECHOCARDIOGRAM  08/03/2013   Mild-Moderate concentric LVH, EF 60-65%. Normal diastolic function. Mild LA dilation. Mild MR with possible vegetation  - not confirmed on TEE   . ULNAR NERVE TRANSPOSITION Right 08/06/2016   Procedure: ULNAR NERVE DECOMPRESSION/TRANSPOSITION;  Surgeon: [name], MD;  Location: [facility] ORS;  Service: Orthopedics;  Laterality: Right;  Marland Kitchen VAGINAL HYSTERECTOMY      in for   Chief Complaint  Patient presents with  . Arm Pain  . Weakness  . Leg Pain     HPI  [name]  is a 66 y.o. female, 2 days status post incision and drainage from posterior Achilles region. She is gradually improving has been afebrile and has had daily dressing changes. Dr. Ola Spurr been consulted and then is checking on antibiotic  therapy.     Social History Social History  Substance Use Topics  . Smoking status: Never Smoker  . Smokeless tobacco: Never Used  . Alcohol use No     Family History Family History  Problem Relation Age of Onset  . Skin cancer Father   . Diabetes Father   . Hypertension Father   . Peripheral vascular disease Father   . Cancer Father   . Cerebral aneurysm Father   . Alcohol abuse Father   . Varicose Veins Mother   . Kidney disease Mother   . Arthritis Mother   . Mental illness Sister   . Cancer Maternal Aunt        breast  . Breast cancer Maternal Aunt   . Arthritis Maternal Grandmother   . Hypertension Maternal Grandmother   . Diabetes Maternal Grandmother   . Arthritis Maternal Grandfather   . Heart disease Maternal Grandfather   . Hypertension Maternal Grandfather   . Arthritis Paternal Grandmother   . Hypertension Paternal Grandmother   . Diabetes Paternal Grandmother   . Arthritis Paternal Grandfather   . Hypertension Paternal Grandfather      Prior to Admission medications   Medication Sig Start Date End Date Taking? Authorizing Provider  acetaminophen (TYLENOL) 500 MG tablet Take 1,000 mg by mouth every 8 (eight) hours as needed for mild pain.   Yes [provider]  albuterol (PROVENTIL HFA;VENTOLIN HFA) 108 (90 Base) MCG/ACT inhaler Inhale 1-2 puffs into the lungs every 4 (four) hours as needed for wheezing or shortness of breath. Patient taking differently: Inhale 2 puffs into the lungs every 4 (four) hours as needed for wheezing or shortness of breath.  03/29/15  Yes Bobetta Lime, MD  albuterol (PROVENTIL) (2.5 MG/3ML) 0.083% nebulizer solution Take 3 mLs (2.5 mg total) by nebulization every 6 (six) hours as needed for wheezing or shortness of breath. 02/21/15  Yes Ashok Norris, MD  ALPRAZolam Duanne Moron) 0.5 MG tablet TAKE ONE TABLET TWICE DAILY AS NEEDED 07/24/16  Yes Cook, Jayce G, DO  buPROPion (WELLBUTRIN XL) 300 MG 24 hr tablet TAKE ONE  TABLET BY MOUTH EVERY DAY 07/22/16  Yes Cook, Jayce G, DO  dabigatran (PRADAXA) 150 MG CAPS capsule TAKE 1 CAPSULE BY MOUTH TWICE DAILY 04/24/16  Yes Cook, Jayce G, DO  Dexlansoprazole 30 MG capsule Take 1 capsule (30 mg total) by mouth daily. 06/30/16  Yes Cook, Jayce G, DO  DULoxetine (CYMBALTA) 60 MG capsule Take 1 capsule (60 mg total) by mouth daily. 09/13/16 12/12/16 Yes King, Diona Foley,  NP  ESTRACE VAGINAL 0.1 MG/GM vaginal cream INSERT 1 GRAM VAGINALLY TWICE A WEEK Patient taking differently: INSERT 1 GRAM VAGINALLY TWICE A WEEK (Tuesday and Thursday) 09/18/15  Yes Thersa Salt G, DO  ferrous sulfate 325 (65 FE) MG tablet Take 1 tablet (325 mg total) by mouth 2 (two) times daily. 07/03/16  Yes Cook, Jayce G, DO  fluticasone-salmeterol (ADVAIR HFA) 242-68 MCG/ACT inhaler Inhale 2 puffs into the lungs 2 (two) times daily. 10/13/15  Yes Gouru, Illene Silver, MD  gabapentin (NEURONTIN) 800 MG tablet Take 1 tablet (800 mg total) by mouth 4 (four) times daily. Patient taking differently: Take 600 mg by mouth 4 (four) times daily.  09/13/16 12/12/16 Yes Vevelyn Francois, NP  hydrocortisone cream 1 % Apply 1 application topically daily as needed for itching.   Yes [provider]  levocetirizine (XYZAL) 5 MG tablet Take 1 tablet (5 mg total) by mouth every evening. Patient taking differently: Take 5 mg by mouth daily as needed.  07/07/16  Yes Cook, Jayce G, DO  levothyroxine (SYNTHROID, LEVOTHROID) 150 MCG tablet TAKE ONE TABLET BY MOUTH EVERY MORNING 07/22/16  Yes Cook, Jayce G, DO  meloxicam (MOBIC) 15 MG tablet Take 15 mg by mouth daily.  08/22/15  Yes [provider]  metFORMIN (GLUCOPHAGE) 1000 MG tablet TAKE ONE TABLET BY MOUTH TWICE DAILY Patient taking differently: TAKE ONE TABLET IN THE MORNING AND HALF TABLET AT NIGHT 05/21/16  Yes Crecencio Mc, MD  metoprolol tartrate (LOPRESSOR) 25 MG tablet TAKE ONE TABLET TWICE DAILY 07/01/16  Yes Cook, Jayce G, DO  mirabegron ER (MYRBETRIQ) 25 MG TB24 tablet  Take 1 tablet (25 mg total) by mouth daily. 12/05/15  Yes McGowan, [name] A, PA-C  montelukast (SINGULAIR) 10 MG tablet TAKE ONE TABLET BY MOUTH EVERY DAY 04/24/16  Yes Cook, Jayce G, DO  oxybutynin (DITROPAN-XL) 10 MG 24 hr tablet Take 1 tablet (10 mg total) by mouth daily. Patient taking differently: Take 10 mg by mouth at bedtime.  12/05/15  Yes McGowan, [name] A, PA-C  quinapril (ACCUPRIL) 20 MG tablet TAKE ONE TABLET EVERY DAY 05/26/16  Yes Cook, Jayce G, DO  cephALEXin (KEFLEX) 500 MG capsule Take 1 capsule (500 mg total) by mouth every 8 (eight) hours. 09/09/16 09/25/16  Fritzi Mandes, MD  HYDROcodone-acetaminophen (NORCO/VICODIN) 5-325 MG tablet Take 1 tablet by mouth every 8 (eight) hours as needed for severe pain. Patient not taking: Reported on 09/05/2016 10/13/16 11/12/16  Vevelyn Francois, NP  HYDROcodone-acetaminophen (NORCO/VICODIN) 5-325 MG tablet Take 1 tablet by mouth every 8 (eight) hours as needed for severe pain. Patient not taking: Reported on 09/05/2016 11/12/16 12/12/16  Vevelyn Francois, NP  HYDROcodone-acetaminophen (NORCO/VICODIN) 5-325 MG tablet Take 1-2 tablets by mouth every 6 (six) hours as needed for severe pain. 09/09/16 10/09/16  Fritzi Mandes, MD  ipratropium (ATROVENT) 0.06 % nasal spray Place 2 sprays into both nostrils 4 (four) times daily. Patient not taking: Reported on 09/05/2016 06/09/16   [name], DO    Anti-infectives    Start     Dose/Rate Route Frequency Ordered Stop   09/09/16 1600  vancomycin (VANCOCIN) IVPB 750 mg/150 ml premix  Status:  Discontinued     750 mg 150 mL/hr over 60 Minutes Intravenous Every 12 hours 09/09/16 0613 09/09/16 1054   09/09/16 1400  cephALEXin (KEFLEX) capsule 500 mg     500 mg Oral Every 8 hours 09/09/16 1041     09/09/16 0000  cephALEXin (KEFLEX) 500 MG  capsule     500 mg Oral Every 8 hours 09/09/16 1041 09/25/16 2359   09/07/16 2100  cefTRIAXone (ROCEPHIN) 1 g in dextrose 5 % 50 mL IVPB  Status:  Discontinued     1 g 120 mL/hr over  30 Minutes Intravenous Every 24 hours 09/07/16 2003 09/08/16 1546   09/06/16 1600  vancomycin (VANCOCIN) IVPB 1000 mg/200 mL premix  Status:  Discontinued     1,000 mg 200 mL/hr over 60 Minutes Intravenous Every 12 hours 09/06/16 1552 09/09/16 0613   09/06/16 1400  meropenem (MERREM) 1 g in sodium chloride 0.9 % 100 mL IVPB  Status:  Discontinued     1 g 200 mL/hr over 30 Minutes Intravenous Every 8 hours 09/06/16 1103 09/07/16 1942   09/06/16 1400  vancomycin (VANCOCIN) IVPB 1000 mg/200 mL premix  Status:  Discontinued     1,000 mg 200 mL/hr over 60 Minutes Intravenous Every 12 hours 09/06/16 1122 09/06/16 1552   09/06/16 0200  vancomycin (VANCOCIN) IVPB 750 mg/150 ml premix  Status:  Discontinued     750 mg 150 mL/hr over 60 Minutes Intravenous Every 12 hours 09/05/16 1621 09/06/16 1122   09/05/16 1530  vancomycin (VANCOCIN) 1,500 mg in sodium chloride 0.9 % 500 mL IVPB     1,500 mg 250 mL/hr over 120 Minutes Intravenous  Once 09/05/16 1518 09/05/16 1741   09/05/16 1045  clindamycin (CLEOCIN) capsule 300 mg     300 mg Oral  Once 09/05/16 1033 09/05/16 1044      Scheduled Meds: . buPROPion  300 mg Oral Daily  . cephALEXin  500 mg Oral Q8H  . DULoxetine  60 mg Oral Daily  . ferrous sulfate  325 mg Oral BID  . gabapentin  800 mg Oral QID  . insulin aspart  0-15 Units Subcutaneous TID WC  . insulin aspart  0-5 Units Subcutaneous QHS  . levothyroxine  150 mcg Oral QAC breakfast  . metFORMIN  1,000 mg Oral Q breakfast  . metFORMIN  500 mg Oral Q supper  . metoprolol tartrate  25 mg Oral BID  . mirabegron ER  25 mg Oral Daily  . mometasone-formoterol  2 puff Inhalation BID  . montelukast  10 mg Oral QHS  . nystatin  5 mL Oral QID  . oxybutynin  10 mg Oral QHS  . pantoprazole  40 mg Oral Daily  . protein supplement shake  11 oz Oral Q24H  . quinapril  20 mg Oral Daily   Continuous Infusions: PRN Meds:.ALPRAZolam, HYDROcodone-acetaminophen, ipratropium-albuterol, loratadine,  morphine injection, ondansetron (ZOFRAN) IV, senna-docusate  Allergies  Allergen Reactions  . Penicillins Hives, Shortness Of Breath and Swelling    Facial swelling Has patient had a PCN reaction causing immediate rash, facial/tongue/throat swelling, SOB or lightheadedness with hypotension: Yes Has patient had a PCN reaction causing severe rash involving mucus membranes or skin necrosis: Yes Has patient had a PCN reaction that required hospitalization No Has patient had a PCN reaction occurring within the last 10 years: No If all of the above answers are "NO", then may proceed with Cephalosporin use.   . Aspirin Hives  . Terramycin [Oxytetracycline] Hives    Physical Exam  Vitals  Blood pressure (!) 162/64, pulse 78, temperature 97.6 F (36.4 C), temperature source Oral, resp. rate 20, height 5\' 5"  (1.651 m), weight 119.7 kg (264 lb), SpO2 (!) 76 %.  Lower Extremity exam:Examination of the left frontal leg shows that overall the cellulitis is vastly improved. The incision  and drainage area stabilizing her still some drainage coming from the open area which is expected point. No heavy purulence is noted drain from the area with compression. Other aspects incision looked be stable. White count has continued to come down stay down.  Data Review  CBC  Recent Labs Lab 09/05/16 0933 09/06/16 0424 09/08/16 0351  WBC 10.8 10.7 7.5  HGB 12.0 12.4 11.2*  HCT 35.9 37.5 33.3*  PLT 220 185 221  MCV 82.7 83.0 83.9  MCH 27.7 27.4 28.3  MCHC 33.5 33.0 33.7  RDW 19.3* 19.2* 18.5*  LYMPHSABS  --   --  0.3*  MONOABS  --   --  0.4  EOSABS  --   --  0.0  BASOSABS  --   --  0.0   ------------------------------------------------------------------------------------------------------------------  Chemistries   Recent Labs Lab 09/05/16 0933 09/06/16 0424 09/08/16 0351  NA 134* 135 136  K 4.8 5.1 5.3*  CL 100* 103 100*  CO2 26 26 29   GLUCOSE 190* 185* 232*  BUN 22* 14 21*   CREATININE 1.01* 0.89 0.71  CALCIUM 9.0 8.6* 8.9   ----------------------------------------------------------------------------------------  Assessment & Plan: Overall the patient is stabilizing. Wound is still erythematous with some drainage but it is improved. Recommend a daily wet-to-dry dressing with significant padding along the Achilles tendon to return to [facility]. type of pressure or irritation to the area. Patient also needs to sit or lie with the Achilles area and the heel suspended so there is no pressure whatsoever. Okay for her to have bathroom privileges and moved from bed to the couch for example. These appointments see me next week. Should wear the postop shoe anytime she is ambulating. Antibiotics as per Dr. Ola Spurr.    Family Communication: Plan discussed with patient   Perry Mount M.D on 09/09/2016 at 12:47 PM  Thank you for the consult, we will follow the patient with you in the Hospital.

## 2016-09-09 NOTE — Discharge Instructions (Signed)
Left heel dressing changes per Dr Selina Cooley recommendations

## 2016-09-09 NOTE — Care Management Note (Signed)
Case Management Note  Patient Details  Name: CURRY DULSKI MRN: [id number] Date of Birth: 1951-03-20   Patient admitted with cellulitis.  Patient lives at home with spouse.  PCP Lacinda Axon.  Patient has cant, BSC, and shower seat in the home.  PT has assessed patient and recommended SNF.  Patient has declined SNF, and wishes to return home with home health services. Patient has been open with [facility] in the past and would like to use them again.  Home health orders placed for RN, PT, and RN.  Sarah with [facility] notified of referral.  Corene Cornea with [facility] to deliver walker prior to discharge.  Family to transport at discharge.  RNCM signing off.   Subjective/Objective:                    Action/Plan:   Expected Discharge Date:                  Expected Discharge Plan:  [place]  In-House Referral:     Discharge planning Services  CM Consult  Post Acute Care Choice:  Durable Medical Equipment, Home Health Choice offered to:  Patient  DME Arranged:  Walker rolling DME Agency:  [place] Arranged:  RN, PT [facility] Agency:  [place] ([facility])  Status of Service:  Completed, signed off  If discussed at [place] of Stay Meetings, dates discussed:    Additional CommentsBeverly Sessions, RN 09/09/2016, 2:06 PM

## 2016-09-09 NOTE — Progress Notes (Signed)
Pharmacy Antibiotic Note  [name] is a 66 y.o. female admitted on 09/05/2016 with cellulitis of LLE with possible abscess to posterior ankle.  Pharmacy was consulted for vancomycin 6/17, now consulted for meropenem. Patient received vancomycin 1500mg  X 1 dose in ED on 6/17.   Patient does report PCN allergy that includes hives, SOB, and swelling. MD is aware.   Plan: NEW: DW: 78Kg    Ke: 0.070   T1/2: 9.9   Vd: 57  Will increase  Vancomycin to  1g IV every 12 hours due to abscess. Calculated trough at Css is 15. Trough ordered prior to 5th dose. Expect some accumulation. Monitor renal function and adjust dose as needed.   6/19 @ 0400 VT 17 therapeutic. Drawn 45 minutes too early from prior dose given but level still within range. Will continue current regimen and recheck VT 6/20 @ 0330.  6/20 @ 0330 VT 22 slightly supratherapeutic, drawn appropriately, patient may be accumulating. Will decrease dose to vanc 750 mg IV q12h for a Css of 16.5 mcg/mL. Will redraw VT 6/22 @ 0330 prior to 4th dose.  Will start meropenem 1g IV every 8 hours. Will discuss with nurse patients PMH of allergy to PCN and S/Sx to look out for during administration.   6/18: ID consulted. D/c meropenem, start Ceftriaxone 1 gram IV Q24h.   Height: 5\' 5"  (165.1 cm) Weight: 264 lb (119.7 kg) IBW/kg (Calculated) : 57  Temp (24hrs), Avg:98.1 F (36.7 C), Min:97.7 F (36.5 C), Max:98.5 F (36.9 C)   Recent Labs Lab 09/05/16 0933 09/06/16 0424 09/08/16 0351 09/09/16 0329  WBC 10.8 10.7 7.5  --   CREATININE 1.01* 0.89 0.71  --   VANCOTROUGH  --   --  17 22*    Estimated Creatinine Clearance: 88.4 mL/min (by C-G formula based on SCr of 0.71 mg/dL).    Allergies  Allergen Reactions  . Penicillins Hives, Shortness Of Breath and Swelling    Facial swelling Has patient had a PCN reaction causing immediate rash, facial/tongue/throat swelling, SOB or lightheadedness with hypotension: Yes Has patient had a PCN  reaction causing severe rash involving mucus membranes or skin necrosis: Yes Has patient had a PCN reaction that required hospitalization No Has patient had a PCN reaction occurring within the last 10 years: No If all of the above answers are "NO", then may proceed with Cephalosporin use.   . Aspirin Hives  . Terramycin [Oxytetracycline] Hives    Antimicrobials this admission: 6/16 vancomycin >>  6/17 meropenem >> 6/18 CTX 6/18 >>  Dose adjustments this admission:   Microbiology results:  Thank you for allowing pharmacy to be a part of this patient's care.  Tobie Lords, PharmD, BCPS Clinical Pharmacist 09/09/2016 6:13 AM

## 2016-09-09 NOTE — Clinical Social Work Note (Signed)
Patient has informed Dr. Posey Pronto that she does not want to consider STR now. Patient to discharge home with home health. RN CM is aware. Shela Leff MSW,LCSW [phone]

## 2016-09-09 NOTE — Progress Notes (Signed)
Pt to be discharged per MD order. IV removed. Instructions reviewed with pt and verbalizes understanding. Scripts given to pt. Spring set up and has given pt walker and BSC. Pt demonstrates feedback on proper dressing changes.

## 2016-09-09 NOTE — Progress Notes (Signed)
[facility] [place] NAME: [name]    MR#:  [id number]  DATE OF BIRTH:  Nov 25, 1950  DATE OF ADMISSION:  09/05/2016 ADMITTING PHYSICIAN: Quintella Baton, MD  DATE OF DISCHARGE: 09/09/16  PRIMARY CARE PHYSICIAN: [name], DO    ADMISSION DIAGNOSIS:  Hypoxemia [R09.02] Hypoxia [R09.02] Cellulitis of left lower extremity without foot [L03.116]  DISCHARGE DIAGNOSIS:  Left Achilles tendon abscess/cellulitis s/p I and D MSSA infection (abscess)  SECONDARY DIAGNOSIS:   Past Medical History:  Diagnosis Date  . Anxiety and depression   . Arthritis   . Asthma   . Cystocele   . Depression   . Diabetes ([place])   . DVT (deep venous thrombosis) (HCC)    Righ calf  . GERD (gastroesophageal reflux disease)   . Heart murmur    Echocardiogram June 2015: Mild MR (possible vegetation seen on TTE, not seen on TEE), normal LV size with moderate concentric LVH. Normal function EF 60-65%. Normal diastolic function. Mild LA dilation.  Marland Kitchen History of IBS   . History of methicillin resistant staphylococcus aureus (MRSA) 2008  . Hypothyroidism   . Insomnia   . Kidney stone    kidney stones with lithotripsy  . Neuropathy involving both lower extremities   . Obesity, Class II, BMI 35-39.9, with comorbidity   . Paroxysmal atrial fibrillation ([place]) 2007   In June 2015, Cardiac Event Monitor: Mostly SR/sinus arrhythmia with PVCs that are frequent. Short bursts of A. fib lasting several minutes;; CHA2DS2-VASc Score = 5 (age, Female, PVD, DM, HTN)  . Peripheral vascular disease ([place])   . Sleep apnea    use C-PAP  . Venous stasis dermatitis of both lower extremities     HOSPITAL COURSE:  66 year old female with history of diabetes mellitus, history shows as I do some gardening, in the evening she started feeling unwell. She states she is not confused but she was just unable to get herself together. This morning she woke up she severe pain in her  left lower extremity and she was unable to stand.   . Cellulitis LLE with left abscess posterior ankle area - On vancomycin and meropenem---now on vanc and ceftriaxone----> po keflex 500 mg tid - Consulted podiatry. Patient had MRI of the foot and ankle with no fluid collection. - s/p I and D of superficial fluid collection over the Achilles tendon. -Fluid cx positive for staph aureus methicillin sensitive -f/u ID as outpt  . Anxiety and depression On home medications  . Apnea, sleep -CPAP QHS  . Acute Asthma exacerbation - improving - prednisone - Nebulizers -Wean O2 as tolerated. Uses oxygen as needed at home  . Chronic pain syndrome -Stable, home medications resumed  . Diabetic peripheral neuropathy associated with type 2 diabetes mellitus (HCC) -ADA diet, sliding-scale insulin -Home medications resumed  . Essential hypertension -Stable, home medications resumed -PRNBlood pressure medication  . Hypothyroidism, adult -Stable, home medications resumed  . Paroxysmal atrial fibrillation ([place]); CHA2DS2-Vasc Score 5.  -On Pradaxa at home. Pradaxa held for surgery. Resume after surgery.  . Peripheral vascular disease due to secondary diabetes mellitus ([place]) -aware  PT recommends SNF, but pt changed her mind and now wants to go home. HHPT and RN for dressing changes   CONSULTS OBTAINED:  Treatment Team:  Leonel Ramsay, MD  DRUG ALLERGIES:   Allergies  Allergen Reactions  . Penicillins Hives, Shortness Of Breath and Swelling    Facial swelling Has patient had a  PCN reaction causing immediate rash, facial/tongue/throat swelling, SOB or lightheadedness with hypotension: Yes Has patient had a PCN reaction causing severe rash involving mucus membranes or skin necrosis: Yes Has patient had a PCN reaction that required hospitalization No Has patient had a PCN reaction occurring within the last 10 years: No If all of the above answers are "NO", then may  proceed with Cephalosporin use.   . Aspirin Hives  . Terramycin [Oxytetracycline] Hives    DISCHARGE MEDICATIONS:   Current Discharge Medication List    START taking these medications   Details  cephALEXin (KEFLEX) 500 MG capsule Take 1 capsule (500 mg total) by mouth every 8 (eight) hours. Qty: 48 capsule, Refills: 0      CONTINUE these medications which have CHANGED   Details  HYDROcodone-acetaminophen (NORCO/VICODIN) 5-325 MG tablet Take 1-2 tablets by mouth every 6 (six) hours as needed for severe pain. Qty: 20 tablet, Refills: 0   Associated Diagnoses: Chronic pain syndrome      CONTINUE these medications which have NOT CHANGED   Details  acetaminophen (TYLENOL) 500 MG tablet Take 1,000 mg by mouth every 8 (eight) hours as needed for mild pain.    albuterol (PROVENTIL HFA;VENTOLIN HFA) 108 (90 Base) MCG/ACT inhaler Inhale 1-2 puffs into the lungs every 4 (four) hours as needed for wheezing or shortness of breath. Qty: 8 g, Refills: 2   Associated Diagnoses: Acute bronchitis with asthma with acute exacerbation    albuterol (PROVENTIL) (2.5 MG/3ML) 0.083% nebulizer solution Take 3 mLs (2.5 mg total) by nebulization every 6 (six) hours as needed for wheezing or shortness of breath. Qty: 150 mL, Refills: 1   Associated Diagnoses: Asthma, unspecified asthma severity, uncomplicated    ALPRAZolam (XANAX) 0.5 MG tablet TAKE ONE TABLET TWICE DAILY AS NEEDED Qty: 60 tablet, Refills: 0   Associated Diagnoses: Anxiety and depression    buPROPion (WELLBUTRIN XL) 300 MG 24 hr tablet TAKE ONE TABLET BY MOUTH EVERY DAY Qty: 90 tablet, Refills: 1    dabigatran (PRADAXA) 150 MG CAPS capsule TAKE 1 CAPSULE BY MOUTH TWICE DAILY Qty: 180 capsule, Refills: 3    Dexlansoprazole 30 MG capsule Take 1 capsule (30 mg total) by mouth daily. Qty: 90 capsule, Refills: 1    DULoxetine (CYMBALTA) 60 MG capsule Take 1 capsule (60 mg total) by mouth daily. Qty: 90 capsule, Refills: 0    Associated Diagnoses: Depression with anxiety    ESTRACE VAGINAL 0.1 MG/GM vaginal cream INSERT 1 GRAM VAGINALLY TWICE A WEEK Qty: 42.5 g, Refills: 6    ferrous sulfate 325 (65 FE) MG tablet Take 1 tablet (325 mg total) by mouth 2 (two) times daily. Qty: 60 tablet, Refills: 3    fluticasone-salmeterol (ADVAIR HFA) 115-21 MCG/ACT inhaler Inhale 2 puffs into the lungs 2 (two) times daily. Qty: 1 Inhaler, Refills: 12    gabapentin (NEURONTIN) 800 MG tablet Take 1 tablet (800 mg total) by mouth 4 (four) times daily. Qty: 360 tablet, Refills: 0   Associated Diagnoses: Diabetic peripheral neuropathy associated with type 2 diabetes mellitus (HCC)    hydrocortisone cream 1 % Apply 1 application topically daily as needed for itching.    levocetirizine (XYZAL) 5 MG tablet Take 1 tablet (5 mg total) by mouth every evening. Qty: 90 tablet, Refills: 3    levothyroxine (SYNTHROID, LEVOTHROID) 150 MCG tablet TAKE ONE TABLET BY MOUTH EVERY MORNING Qty: 90 tablet, Refills: 3    meloxicam (MOBIC) 15 MG tablet Take 15 mg by mouth daily.  metFORMIN (GLUCOPHAGE) 1000 MG tablet TAKE ONE TABLET BY MOUTH TWICE DAILY Qty: 180 tablet, Refills: 1    metoprolol tartrate (LOPRESSOR) 25 MG tablet TAKE ONE TABLET TWICE DAILY Qty: 180 tablet, Refills: 3    mirabegron ER (MYRBETRIQ) 25 MG TB24 tablet Take 1 tablet (25 mg total) by mouth daily. Qty: 30 tablet, Refills: 12    montelukast (SINGULAIR) 10 MG tablet TAKE ONE TABLET BY MOUTH EVERY DAY Qty: 90 tablet, Refills: 3   Associated Diagnoses: Asthma, moderate persistent, well-controlled    oxybutynin (DITROPAN-XL) 10 MG 24 hr tablet Take 1 tablet (10 mg total) by mouth daily. Qty: 30 tablet, Refills: 12    quinapril (ACCUPRIL) 20 MG tablet TAKE ONE TABLET EVERY DAY Qty: 90 tablet, Refills: 3    ipratropium (ATROVENT) 0.06 % nasal spray Place 2 sprays into both nostrils 4 (four) times daily. Qty: 15 mL, Refills: 12        If you experience  worsening of your admission symptoms, develop shortness of breath, life threatening emergency, suicidal or homicidal thoughts you must seek medical attention immediately by calling 911 or calling your MD immediately  if symptoms less severe.  You Must read complete instructions/literature along with all the possible adverse reactions/side effects for all the Medicines you take and that have been prescribed to you. Take any new Medicines after you have completely understood and accept all the possible adverse reactions/side effects.   Please note  You were cared for by a hospitalist during your hospital stay. If you have any questions about your discharge medications or the care you received while you were in the hospital after you are discharged, you can call the unit and asked to speak with the hospitalist on call if the hospitalist that took care of you is not available. Once you are discharged, your primary care physician will handle any further medical issues. Please note that NO REFILLS for any discharge medications will be authorized once you are discharged, as it is imperative that you return to your primary care physician (or establish a relationship with a primary care physician if you do not have one) for your aftercare needs so that they can reassess your need for medications and monitor your lab values. Today   SUBJECTIVE   Doing well.requesting to go home  VITAL SIGNS:  Blood pressure (!) 156/72, pulse (!) 58, temperature 98.5 F (36.9 C), temperature source Oral, resp. rate 20, height 5\' 5"  (1.651 m), weight 119.7 kg (264 lb), SpO2 94 %.  I/O:    Intake/Output Summary (Last 24 hours) at 09/09/16 1047 Last data filed at 09/09/16 0924  Gross per 24 hour  Intake              840 ml  Output             1200 ml  Net             -360 ml    PHYSICAL EXAMINATION:  GENERAL:  66 y.o.-year-old patient lying in the bed with no acute distress.  EYES: Pupils equal, round, reactive to  light and accommodation. No scleral icterus. Extraocular muscles intact.  HEENT: Head atraumatic, normocephalic. Oropharynx and nasopharynx clear.  NECK:  Supple, no jugular venous distention. No thyroid enlargement, no tenderness.  LUNGS: Normal breath sounds bilaterally, no wheezing, rales,rhonchi or crepitation. No use of accessory muscles of respiration.  CARDIOVASCULAR: S1, S2 normal. No murmurs, rubs, or gallops.  ABDOMEN: Soft, non-tender, non-distended. Bowel sounds present. No organomegaly or  mass.  EXTREMITIES: No pedal edema, cyanosis, or clubbing. Left LE dressing + NEUROLOGIC: Cranial nerves II through XII are intact. Muscle strength 5/5 in all extremities. Sensation intact. Gait not checked.  PSYCHIATRIC: The patient is alert and oriented x 3.  SKIN: No obvious rash, lesion, or ulcer.   DATA REVIEW:   CBC   Recent Labs Lab 09/08/16 0351  WBC 7.5  HGB 11.2*  HCT 33.3*  PLT 221    Chemistries   Recent Labs Lab 09/08/16 0351  NA 136  K 5.3*  CL 100*  CO2 29  GLUCOSE 232*  BUN 21*  CREATININE 0.71  CALCIUM 8.9    Microbiology Results   Recent Results (from the past 240 hour(s))  Culture, blood (Routine X 2) w Reflex to ID Panel     Status: None (Preliminary result)   Collection Time: 09/05/16  3:47 PM  Result Value Ref Range Status   Specimen Description BLOOD BLOOD RIGHT WRIST  Final   Special Requests   Final    BOTTLES DRAWN AEROBIC AND ANAEROBIC Blood Culture adequate volume   Culture NO GROWTH 4 DAYS  Final   Report Status PENDING  Incomplete  Culture, blood (Routine X 2) w Reflex to ID Panel     Status: None (Preliminary result)   Collection Time: 09/05/16  3:47 PM  Result Value Ref Range Status   Specimen Description BLOOD BLOOD LEFT ARM  Final   Special Requests   Final    BOTTLES DRAWN AEROBIC AND ANAEROBIC Blood Culture adequate volume   Culture NO GROWTH 4 DAYS  Final   Report Status PENDING  Incomplete  MRSA PCR Screening     Status:  None   Collection Time: 09/07/16 10:12 AM  Result Value Ref Range Status   MRSA by PCR NEGATIVE NEGATIVE Final    Comment:        The GeneXpert MRSA Assay (FDA approved for NASAL specimens only), is one component of a comprehensive MRSA colonization surveillance program. It is not intended to diagnose MRSA infection nor to guide or monitor treatment for MRSA infections.   Aerobic/Anaerobic Culture (surgical/deep wound)     Status: None (Preliminary result)   Collection Time: 09/07/16  1:08 PM  Result Value Ref Range Status   Specimen Description ABSCESS LEFT HEEL ABSCESS  Final   Special Requests NONE  Final   Gram Stain   Final    ABUNDANT WBC PRESENT,BOTH PMN AND MONONUCLEAR RARE GRAM POSITIVE COCCI IN PAIRS IN SINGLES Performed at [facility], [place] [address] [place], [place] [address]    Culture FEW STAPHYLOCOCCUS AUREUS  Final   Report Status PENDING  Incomplete   Organism ID, Bacteria STAPHYLOCOCCUS AUREUS  Final      Susceptibility   Staphylococcus aureus - MIC*    CIPROFLOXACIN <=0.5 SENSITIVE Sensitive     ERYTHROMYCIN >=8 RESISTANT Resistant     GENTAMICIN <=0.5 SENSITIVE Sensitive     OXACILLIN <=0.25 SENSITIVE Sensitive     TETRACYCLINE >=16 RESISTANT Resistant     VANCOMYCIN <=0.5 SENSITIVE Sensitive     TRIMETH/SULFA <=10 SENSITIVE Sensitive     CLINDAMYCIN >=8 RESISTANT Resistant     RIFAMPIN <=0.5 SENSITIVE Sensitive     Inducible Clindamycin NEGATIVE Sensitive     * FEW STAPHYLOCOCCUS AUREUS    RADIOLOGY:  No results found.   Management plans discussed with the patient, family and they are in agreement.  CODE STATUS:     Code Status Orders  Start     Ordered   09/05/16 1629  Full code  Continuous     09/05/16 1628    Code Status History    Date Active Date Inactive Code Status Order ID Comments User Context   08/06/2016  8:21 PM 08/07/2016  5:27 PM Full Code [id number]  [name], MD Inpatient   08/06/2016  2:03 PM 08/06/2016   8:21 PM Full Code [id number]  [name], MD Inpatient   10/08/2015 12:06 PM 10/13/2015  5:34 PM Full Code [id number]  Poggi, Marshall Cork, MD Inpatient    Advance Directive Documentation     Most Recent Value  Type of Advance Directive  Healthcare Power of [place], Living will  Pre-existing out of facility DNR order (yellow form or pink MOST form)  -  "MOST" Form in Place?  -      TOTAL TIME TAKING CARE OF THIS PATIENT: *40* minutes.    Poseidon Pam M.D on 09/09/2016 at 10:47 AM  Between 7am to 6pm - Pager - [phone] After 6pm go to [email or web address] - password [email or web address] [facility]  Office  [phone]  CC: Primary care physician; [name], DO

## 2016-09-09 NOTE — Care Management Important Message (Signed)
Important Message  Patient Details  Name: [name] MRN: [id number] Date of Birth: 1950/05/20   Medicare Important Message Given:  Yes    Beverly Sessions, RN 09/09/2016, 10:43 AM

## 2016-09-09 NOTE — Telephone Encounter (Signed)
HFU/ Dx was left heel infection/ Pt is scheduled for 09/21/2016 at 11:15am. Thank you!

## 2016-09-10 DIAGNOSIS — Z96619 Presence of unspecified artificial shoulder joint: Secondary | ICD-10-CM | POA: Diagnosis not present

## 2016-09-10 DIAGNOSIS — R0600 Dyspnea, unspecified: Secondary | ICD-10-CM | POA: Diagnosis not present

## 2016-09-10 DIAGNOSIS — L0390 Cellulitis, unspecified: Secondary | ICD-10-CM | POA: Diagnosis not present

## 2016-09-10 DIAGNOSIS — R0602 Shortness of breath: Secondary | ICD-10-CM | POA: Diagnosis not present

## 2016-09-10 DIAGNOSIS — L03116 Cellulitis of left lower limb: Secondary | ICD-10-CM | POA: Diagnosis not present

## 2016-09-10 DIAGNOSIS — E1169 Type 2 diabetes mellitus with other specified complication: Secondary | ICD-10-CM | POA: Diagnosis not present

## 2016-09-10 DIAGNOSIS — J45909 Unspecified asthma, uncomplicated: Secondary | ICD-10-CM | POA: Diagnosis not present

## 2016-09-10 DIAGNOSIS — E069 Thyroiditis, unspecified: Secondary | ICD-10-CM | POA: Diagnosis not present

## 2016-09-10 DIAGNOSIS — M129 Arthropathy, unspecified: Secondary | ICD-10-CM | POA: Diagnosis not present

## 2016-09-10 DIAGNOSIS — I10 Essential (primary) hypertension: Secondary | ICD-10-CM | POA: Diagnosis not present

## 2016-09-10 LAB — CULTURE, BLOOD (ROUTINE X 2)
CULTURE: NO GROWTH
Culture: NO GROWTH
SPECIAL REQUESTS: ADEQUATE
Special Requests: ADEQUATE

## 2016-09-11 DIAGNOSIS — L03116 Cellulitis of left lower limb: Secondary | ICD-10-CM | POA: Diagnosis not present

## 2016-09-11 DIAGNOSIS — E1169 Type 2 diabetes mellitus with other specified complication: Secondary | ICD-10-CM | POA: Diagnosis not present

## 2016-09-12 LAB — AEROBIC/ANAEROBIC CULTURE W GRAM STAIN (SURGICAL/DEEP WOUND)

## 2016-09-12 LAB — AEROBIC/ANAEROBIC CULTURE (SURGICAL/DEEP WOUND)

## 2016-09-12 NOTE — Discharge Summary (Signed)
[facility] NAME: [name]    MR#:  [id number]  DATE OF BIRTH:  1950/10/29  DATE OF ADMISSION:  09/05/2016    ADMITTING PHYSICIAN: Quintella Baton, MD  DATE OF DISCHARGE: 09/09/16  PRIMARY CARE PHYSICIAN: [name], DO    ADMISSION DIAGNOSIS:  Hypoxemia [R09.02] Hypoxia [R09.02] Cellulitis of left lower extremity without foot [L03.116]  DISCHARGE DIAGNOSIS:  Left Achilles tendon abscess/cellulitis s/p I and D MSSA infection (abscess)  SECONDARY DIAGNOSIS:       Past Medical History:  Diagnosis Date  . Anxiety and depression   . Arthritis   . Asthma   . Cystocele   . Depression   . Diabetes ([place])   . DVT (deep venous thrombosis) (HCC)    Righ calf  . GERD (gastroesophageal reflux disease)   . Heart murmur    Echocardiogram June 2015: Mild MR (possible vegetation seen on TTE, not seen on TEE), normal LV size with moderate concentric LVH. Normal function EF 60-65%. Normal diastolic function. Mild LA dilation.  Marland Kitchen History of IBS   . History of methicillin resistant staphylococcus aureus (MRSA) 2008  . Hypothyroidism   . Insomnia   . Kidney stone    kidney stones with lithotripsy  . Neuropathy involving both lower extremities   . Obesity, Class II, BMI 35-39.9, with comorbidity   . Paroxysmal atrial fibrillation ([place]) 2007   In June 2015, Cardiac Event Monitor: Mostly SR/sinus arrhythmia with PVCs that are frequent. Short bursts of A. fib lasting several minutes;; CHA2DS2-VASc Score = 5 (age, Female, PVD, DM, HTN)  . Peripheral vascular disease ([place])   . Sleep apnea    use C-PAP  . Venous stasis dermatitis of both lower extremities     HOSPITAL COURSE:  66 year old female with history of diabetes mellitus, history shows as I do some gardening, in the evening she started feeling unwell. She states she is not confused but she was just unable to get herself together.  This morning she woke up she severe pain in her left lower extremity and she was unable to stand.   .Cellulitis LLE with leftabscess posterior ankle area - On vancomycin and meropenem---now on vanc and ceftriaxone----> po keflex 500 mg tid - Consultedpodiatry. Patient had MRI of the foot and ankle with no fluid collection. - s/p I and D ofsuperficial fluid collection over the Achilles tendon. -Fluid cx positive for staph aureus methicillin sensitive -f/u ID as outpt  .Anxiety and depression On home medications  .Apnea, sleep -CPAP QHS  .Acute Asthma exacerbation - improving - prednisone - Nebulizers -Wean O2 as tolerated. Uses oxygen as needed at home  .Chronic pain syndrome -Stable, home medications resumed  .Diabetic peripheral neuropathy associated with type 2 diabetes mellitus (HCC) -ADA diet, sliding-scale insulin -Home medications resumed  .Essential hypertension -Stable, home medications resumed -PRNBlood pressure medication  .Hypothyroidism, adult -Stable, home medications resumed  .Paroxysmal atrial fibrillation ([place]); CHA2DS2-Vasc Score 5.  -On Pradaxa at home. Pradaxa held for surgery. Resume after surgery.  .Peripheral vascular disease due to secondary diabetes mellitus ([place]) -aware  PT recommends SNF, but pt changed her mind and now wants to go home. HHPT and RN for dressing changes   CONSULTS OBTAINED:  Treatment Team:  Leonel Ramsay, MD  DRUG ALLERGIES:        Allergies  Allergen Reactions  . Penicillins Hives, Shortness Of Breath and Swelling    Facial swelling Has patient had a PCN  reaction causing immediate rash, facial/tongue/throat swelling, SOB or lightheadedness with hypotension: Yes Has patient had a PCN reaction causing severe rash involving mucus membranes or skin necrosis: Yes Has patient had a PCN reaction that required hospitalization No Has patient had a PCN reaction occurring within the last  10 years: No If all of the above answers are "NO", then may proceed with Cephalosporin use.   . Aspirin Hives  . Terramycin [Oxytetracycline] Hives    DISCHARGE MEDICATIONS:       Current Discharge Medication List        START taking these medications   Details  cephALEXin (KEFLEX) 500 MG capsule Take 1 capsule (500 mg total) by mouth every 8 (eight) hours. Qty: 48 capsule, Refills: 0          CONTINUE these medications which have CHANGED   Details  HYDROcodone-acetaminophen (NORCO/VICODIN) 5-325 MG tablet Take 1-2 tablets by mouth every 6 (six) hours as needed for severe pain. Qty: 20 tablet, Refills: 0   Associated Diagnoses: Chronic pain syndrome          CONTINUE these medications which have NOT CHANGED   Details  acetaminophen (TYLENOL) 500 MG tablet Take 1,000 mg by mouth every 8 (eight) hours as needed for mild pain.    albuterol (PROVENTIL HFA;VENTOLIN HFA) 108 (90 Base) MCG/ACT inhaler Inhale 1-2 puffs into the lungs every 4 (four) hours as needed for wheezing or shortness of breath. Qty: 8 g, Refills: 2   Associated Diagnoses: Acute bronchitis with asthma with acute exacerbation    albuterol (PROVENTIL) (2.5 MG/3ML) 0.083% nebulizer solution Take 3 mLs (2.5 mg total) by nebulization every 6 (six) hours as needed for wheezing or shortness of breath. Qty: 150 mL, Refills: 1   Associated Diagnoses: Asthma, unspecified asthma severity, uncomplicated    ALPRAZolam (XANAX) 0.5 MG tablet TAKE ONE TABLET TWICE DAILY AS NEEDED Qty: 60 tablet, Refills: 0   Associated Diagnoses: Anxiety and depression    buPROPion (WELLBUTRIN XL) 300 MG 24 hr tablet TAKE ONE TABLET BY MOUTH EVERY DAY Qty: 90 tablet, Refills: 1    dabigatran (PRADAXA) 150 MG CAPS capsule TAKE 1 CAPSULE BY MOUTH TWICE DAILY Qty: 180 capsule, Refills: 3    Dexlansoprazole 30 MG capsule Take 1 capsule (30 mg total) by mouth daily. Qty: 90 capsule, Refills: 1    DULoxetine  (CYMBALTA) 60 MG capsule Take 1 capsule (60 mg total) by mouth daily. Qty: 90 capsule, Refills: 0   Associated Diagnoses: Depression with anxiety    ESTRACE VAGINAL 0.1 MG/GM vaginal cream INSERT 1 GRAM VAGINALLY TWICE A WEEK Qty: 42.5 g, Refills: 6    ferrous sulfate 325 (65 FE) MG tablet Take 1 tablet (325 mg total) by mouth 2 (two) times daily. Qty: 60 tablet, Refills: 3    fluticasone-salmeterol (ADVAIR HFA) 115-21 MCG/ACT inhaler Inhale 2 puffs into the lungs 2 (two) times daily. Qty: 1 Inhaler, Refills: 12    gabapentin (NEURONTIN) 800 MG tablet Take 1 tablet (800 mg total) by mouth 4 (four) times daily. Qty: 360 tablet, Refills: 0   Associated Diagnoses: Diabetic peripheral neuropathy associated with type 2 diabetes mellitus (HCC)    hydrocortisone cream 1 % Apply 1 application topically daily as needed for itching.    levocetirizine (XYZAL) 5 MG tablet Take 1 tablet (5 mg total) by mouth every evening. Qty: 90 tablet, Refills: 3    levothyroxine (SYNTHROID, LEVOTHROID) 150 MCG tablet TAKE ONE TABLET BY MOUTH EVERY MORNING Qty: 90 tablet, Refills: 3  meloxicam (MOBIC) 15 MG tablet Take 15 mg by mouth daily.     metFORMIN (GLUCOPHAGE) 1000 MG tablet TAKE ONE TABLET BY MOUTH TWICE DAILY Qty: 180 tablet, Refills: 1    metoprolol tartrate (LOPRESSOR) 25 MG tablet TAKE ONE TABLET TWICE DAILY Qty: 180 tablet, Refills: 3    mirabegron ER (MYRBETRIQ) 25 MG TB24 tablet Take 1 tablet (25 mg total) by mouth daily. Qty: 30 tablet, Refills: 12    montelukast (SINGULAIR) 10 MG tablet TAKE ONE TABLET BY MOUTH EVERY DAY Qty: 90 tablet, Refills: 3   Associated Diagnoses: Asthma, moderate persistent, well-controlled    oxybutynin (DITROPAN-XL) 10 MG 24 hr tablet Take 1 tablet (10 mg total) by mouth daily. Qty: 30 tablet, Refills: 12    quinapril (ACCUPRIL) 20 MG tablet TAKE ONE TABLET EVERY DAY Qty: 90 tablet, Refills: 3    ipratropium (ATROVENT) 0.06 % nasal  spray Place 2 sprays into both nostrils 4 (four) times daily. Qty: 15 mL, Refills: 12        If you experience worsening of your admission symptoms, develop shortness of breath, life threatening emergency, suicidal or homicidal thoughts you must seek medical attention immediately by calling 911 or calling your MD immediately  if symptoms less severe.  You Must read complete instructions/literature along with all the possible adverse reactions/side effects for all the Medicines you take and that have been prescribed to you. Take any new Medicines after you have completely understood and accept all the possible adverse reactions/side effects.   Please note  You were cared for by a hospitalist during your hospital stay. If you have any questions about your discharge medications or the care you received while you were in the hospital after you are discharged, you can call the unit and asked to speak with the hospitalist on call if the hospitalist that took care of you is not available. Once you are discharged, your primary care physician will handle any further medical issues. Please note that NO REFILLS for any discharge medications will be authorized once you are discharged, as it is imperative that you return to your primary care physician (or establish a relationship with a primary care physician if you do not have one) for your aftercare needs so that they can reassess your need for medications and monitor your lab values. Today   SUBJECTIVE   Doing well.requesting to go home  VITAL SIGNS:  Blood pressure (!) 156/72, pulse (!) 58, temperature 98.5 F (36.9 C), temperature source Oral, resp. rate 20, height 5\' 5"  (1.651 m), weight 119.7 kg (264 lb), SpO2 94 %.  I/O:    Intake/Output Summary (Last 24 hours) at 09/09/16 1047 Last data filed at 09/09/16 0924  Gross per 24 hour  Intake              840 ml  Output             1200 ml  Net             -360 ml    PHYSICAL  EXAMINATION:  GENERAL:  66 y.o.-year-old patient lying in the bed with no acute distress.  EYES: Pupils equal, round, reactive to light and accommodation. No scleral icterus. Extraocular muscles intact.  HEENT: Head atraumatic, normocephalic. Oropharynx and nasopharynx clear.  NECK:  Supple, no jugular venous distention. No thyroid enlargement, no tenderness.  LUNGS: Normal breath sounds bilaterally, no wheezing, rales,rhonchi or crepitation. No use of accessory muscles of respiration.  CARDIOVASCULAR: S1, S2 normal. No  murmurs, rubs, or gallops.  ABDOMEN: Soft, non-tender, non-distended. Bowel sounds present. No organomegaly or mass.  EXTREMITIES: No pedal edema, cyanosis, or clubbing. Left LE dressing + NEUROLOGIC: Cranial nerves II through XII are intact. Muscle strength 5/5 in all extremities. Sensation intact. Gait not checked.  PSYCHIATRIC: The patient is alert and oriented x 3.  SKIN: No obvious rash, lesion, or ulcer.   DATA REVIEW:   CBC   Last Labs    Recent Labs Lab 09/08/16 0351  WBC 7.5  HGB 11.2*  HCT 33.3*  PLT 221      Chemistries   Last Labs    Recent Labs Lab 09/08/16 0351  NA 136  K 5.3*  CL 100*  CO2 29  GLUCOSE 232*  BUN 21*  CREATININE 0.71  CALCIUM 8.9      Microbiology Results          Recent Results (from the past 240 hour(s))  Culture, blood (Routine X 2) w Reflex to ID Panel     Status: None (Preliminary result)   Collection Time: 09/05/16  3:47 PM  Result Value Ref Range Status   Specimen Description BLOOD BLOOD RIGHT WRIST  Final   Special Requests   Final    BOTTLES DRAWN AEROBIC AND ANAEROBIC Blood Culture adequate volume   Culture NO GROWTH 4 DAYS  Final   Report Status PENDING  Incomplete  Culture, blood (Routine X 2) w Reflex to ID Panel     Status: None (Preliminary result)   Collection Time: 09/05/16  3:47 PM  Result Value Ref Range Status   Specimen Description BLOOD BLOOD LEFT ARM  Final    Special Requests   Final    BOTTLES DRAWN AEROBIC AND ANAEROBIC Blood Culture adequate volume   Culture NO GROWTH 4 DAYS  Final   Report Status PENDING  Incomplete  MRSA PCR Screening     Status: None   Collection Time: 09/07/16 10:12 AM  Result Value Ref Range Status   MRSA by PCR NEGATIVE NEGATIVE Final    Comment:        The GeneXpert MRSA Assay (FDA approved for NASAL specimens only), is one component of a comprehensive MRSA colonization surveillance program. It is not intended to diagnose MRSA infection nor to guide or monitor treatment for MRSA infections.   Aerobic/Anaerobic Culture (surgical/deep wound)     Status: None (Preliminary result)   Collection Time: 09/07/16  1:08 PM  Result Value Ref Range Status   Specimen Description ABSCESS LEFT HEEL ABSCESS  Final   Special Requests NONE  Final   Gram Stain   Final    ABUNDANT WBC PRESENT,BOTH PMN AND MONONUCLEAR RARE GRAM POSITIVE COCCI IN PAIRS IN SINGLES Performed at [facility], [place] [address], [place], [place] [address]    Culture FEW STAPHYLOCOCCUS AUREUS  Final   Report Status PENDING  Incomplete   Organism ID, Bacteria STAPHYLOCOCCUS AUREUS  Final      Susceptibility   Staphylococcus aureus - MIC*    CIPROFLOXACIN <=0.5 SENSITIVE Sensitive     ERYTHROMYCIN >=8 RESISTANT Resistant     GENTAMICIN <=0.5 SENSITIVE Sensitive     OXACILLIN <=0.25 SENSITIVE Sensitive     TETRACYCLINE >=16 RESISTANT Resistant     VANCOMYCIN <=0.5 SENSITIVE Sensitive     TRIMETH/SULFA <=10 SENSITIVE Sensitive     CLINDAMYCIN >=8 RESISTANT Resistant     RIFAMPIN <=0.5 SENSITIVE Sensitive     Inducible Clindamycin NEGATIVE Sensitive     *  FEW STAPHYLOCOCCUS AUREUS    RADIOLOGY:  Imaging Results (Last 48 hours)  No results found.     Management plans discussed with the patient, family and they are in agreement.  CODE STATUS:           Code Status  Orders        Start     Ordered   09/05/16 1629  Full code  Continuous     09/05/16 1628                      Code Status History    Date Active Date Inactive Code Status Order ID Comments User Context   08/06/2016  8:21 PM 08/07/2016  5:27 PM Full Code [id number]  [name], MD Inpatient   08/06/2016  2:03 PM 08/06/2016  8:21 PM Full Code [id number]  [name], MD Inpatient   10/08/2015 12:06 PM 10/13/2015  5:34 PM Full Code [id number]  Poggi, Marshall Cork, MD Inpatient          Advance Directive Documentation     Most Recent Value  Type of Advance Directive  Healthcare Power of [place], Living will  Pre-existing out of facility DNR order (yellow form or pink MOST form)  -  "MOST" Form in Place?  -      TOTAL TIME TAKING CARE OF THIS PATIENT: *40* minutes.    Ericca Labra M.D on 09/09/2016 at 10:47 AM  Between 7am to 6pm - Pager - [phone] After 6pm go to [email or web address] - password [email or web address] [email or web address]  Office  [phone]  CC: Primary care physician; [name], DO

## 2016-09-16 ENCOUNTER — Telehealth: Payer: Self-pay | Admitting: Family Medicine

## 2016-09-16 NOTE — Telephone Encounter (Signed)
Ben [phone] called from [facility] home [facility] regarding pt refusing PT. Reason why pt spouse refusal for PT eval he believes that pt will not be able to do therapy because pt has wounds on her feet. Thank you!

## 2016-09-16 NOTE — Telephone Encounter (Signed)
Tried calling [name] to get more info but was unable to get more info.

## 2016-09-21 ENCOUNTER — Ambulatory Visit (INDEPENDENT_AMBULATORY_CARE_PROVIDER_SITE_OTHER): Payer: PPO | Admitting: Family Medicine

## 2016-09-21 ENCOUNTER — Other Ambulatory Visit: Payer: Self-pay | Admitting: Podiatry

## 2016-09-21 ENCOUNTER — Ambulatory Visit: Payer: PPO | Admitting: Family Medicine

## 2016-09-21 DIAGNOSIS — M65072 Abscess of tendon sheath, left ankle and foot: Secondary | ICD-10-CM | POA: Insufficient documentation

## 2016-09-21 DIAGNOSIS — E1143 Type 2 diabetes mellitus with diabetic autonomic (poly)neuropathy: Secondary | ICD-10-CM | POA: Diagnosis not present

## 2016-09-21 NOTE — Progress Notes (Signed)
Subjective:  Patient ID: [name], female    DOB: Feb 05, 1951  Age: 66 y.o. MRN: [id number]  CC: Hospital follow up  HPI:  66 year old female with an extensive PMH including DM-2 with complications, PAD, HTN, Afib, Asthma, Chronic pain, GERD, Morbid obesity presents for hospital follow up.   Patient admitted from 6/26 - 6/20.  She presented with severe left lower extremity pain (posterior ankle/achilles). Found to have cellulitis and abscess. Started on IV antibiotics. Podiatry consulted and she underwent I & D. Culture revealed MSSA. Discharged home on PO Keflex.  Has ID follow up scheduled.   Patient presents today for follow up. She has seen podiatry for follow up. Remains on Keflex. Cipro recently added. Has open wound of the posterior heel. Achilles is visible. Current plan for surgery 7/6 (Debridment, Application of wound vac). Patient endorses compliance with antibiotic. No fever. She is very concerned about wound healing and losing her foot. She is frustrated that she cannot be active/do much as it was recommended that she rest. Reports significant pain.  Social Hx   Social History   Social History  . Marital status: Married    Spouse name: N/A  . Number of children: N/A  . Years of education: N/A   Social History Main Topics  . Smoking status: Never Smoker  . Smokeless tobacco: Never Used  . Alcohol use No  . Drug use: No  . Sexual activity: No   Other Topics Concern  . Not on file   Social History Narrative   She is currently married -- for 28 years. Does not work. Does not smoke or take alcohol. She never smoked. She exercises at least 3 days a week since before her gastric surgery.   Marital status reviewed in history of present illness.    Review of Systems  Constitutional: Negative for fever.  Musculoskeletal:       Pain - posterior heel/ankle.  Skin: Positive for wound.     Objective:  BP 118/82 (BP Location: Left Arm, Patient Position: Sitting,  Cuff Size: Large)   Pulse 66   Temp 97.8 F (36.6 C) (Oral)   Wt 263 lb (119.3 kg)   BMI 43.77 kg/m   BP/Weight 09/21/2016 09/09/2016 0/86/5784  Systolic BP 696 295 -  Diastolic BP 82 64 -  Wt. (Lbs) 263 - 264  BMI 43.77 - 43.93    Physical Exam  Constitutional: She is oriented to person, place, and time. She appears well-developed. No distress.  HENT:  Head: Normocephalic and atraumatic.  Pulmonary/Chest: Effort normal. No respiratory distress.  Neurological: She is alert and oriented to person, place, and time.  Skin:  Left foot/ankle - open wound with exposed achilles tendon.   Psychiatric: She has a normal mood and affect.  Vitals reviewed.   Lab Results  Component Value Date   WBC 7.5 09/08/2016   HGB 11.2 (L) 09/08/2016   HCT 33.3 (L) 09/08/2016   PLT 221 09/08/2016   GLUCOSE 232 (H) 09/08/2016   CHOL 191 06/09/2016   TRIG 150.0 (H) 06/09/2016   HDL 81.40 06/09/2016   LDLCALC 79 06/09/2016   ALT 9 06/09/2016   AST 11 06/09/2016   NA 136 09/08/2016   K 5.3 (H) 09/08/2016   CL 100 (L) 09/08/2016   CREATININE 0.71 09/08/2016   BUN 21 (H) 09/08/2016   CO2 29 09/08/2016   TSH 1.06 06/09/2016   INR 1.18 09/26/2015   HGBA1C 6.9 (H) 06/09/2016  Assessment & Plan:   Problem List Items Addressed This Visit      Musculoskeletal and Integument   Abscess of tendon of left foot    New problem. Hospital course reviewed. Meds reconciled. Continue Cipro and Keflex. Lengthy discussion about treatment options regarding her wound. Discussed case with podiatrist. Options - Surgery and wound vac, remove portion of achilles to get wound to heal, plastic surgery referral. Podiatry to discuss tomorrow (patient has office visit).          Meds ordered this encounter  Medications  . ciprofloxacin (CIPRO) 500 MG tablet    Sig: Take by mouth.  . fluticasone-salmeterol (ADVAIR HFA) 115-21 MCG/ACT inhaler    Sig: Inhale 2 puffs into the lungs every 12 (twelve) hours.  .  cetirizine (ZYRTEC) 10 MG tablet    Sig: Take 10 tablets by mouth daily.    Follow-up: PRN  30 minutes were spent face-to-face with the patient during this encounter and over half of that time was spent on counseling regarding treatment options to close/heal her wound. Case discussed with Podiatrist as well.   [place]

## 2016-09-21 NOTE — Assessment & Plan Note (Signed)
New problem. Hospital course reviewed. Meds reconciled. Continue Cipro and Keflex. Lengthy discussion about treatment options regarding her wound. Discussed case with podiatrist. Options - Surgery and wound vac, remove portion of achilles to get wound to heal, plastic surgery referral. Podiatry to discuss tomorrow (patient has office visit).

## 2016-09-22 ENCOUNTER — Telehealth: Payer: Self-pay | Admitting: *Deleted

## 2016-09-22 NOTE — Telephone Encounter (Signed)
Melissa can you advise on what I see and who I need to direct her too?

## 2016-09-22 NOTE — Telephone Encounter (Signed)
I don't know about this

## 2016-09-22 NOTE — Telephone Encounter (Signed)
I see a referral not by [place], by sharon wilkes, Please advise?

## 2016-09-22 NOTE — Telephone Encounter (Signed)
Patient is checking on her referral for wound care.  Pt contact  [phone]

## 2016-09-24 DIAGNOSIS — M65072 Abscess of tendon sheath, left ankle and foot: Secondary | ICD-10-CM | POA: Diagnosis not present

## 2016-09-24 NOTE — OR Nursing (Signed)
Pt contacted and pre-op instruction reviewed from previous visit. Pt stated she took metformin today 09-24-2016 and pradaxa on 09-22-2016. Pt also stated that she uses a walker and was slow getting to the door and the [employer] personnel left a note and did not leave wound vac supplies despite she was yelling for that she was coming. She said the office was working on this situation. Denna Haggard RN in [place] notified of the above situation re: wound vac supplies by Idamae Lusher RN.

## 2016-09-25 ENCOUNTER — Ambulatory Visit: Payer: PPO | Admitting: Anesthesiology

## 2016-09-25 ENCOUNTER — Observation Stay
Admission: RE | Admit: 2016-09-25 | Discharge: 2016-09-26 | Disposition: A | Discharge status: Home Health | Payer: PPO | Source: Ambulatory Visit | Attending: Podiatry | Admitting: Podiatry

## 2016-09-25 ENCOUNTER — Encounter
Admission: RE | Disposition: A | Discharge status: Home Health | Payer: Self-pay | Source: Ambulatory Visit | Attending: Podiatry

## 2016-09-25 ENCOUNTER — Encounter: Payer: Self-pay | Admitting: *Deleted

## 2016-09-25 DIAGNOSIS — M65172 Other infective (teno)synovitis, left ankle and foot: Secondary | ICD-10-CM | POA: Diagnosis not present

## 2016-09-25 DIAGNOSIS — F419 Anxiety disorder, unspecified: Secondary | ICD-10-CM | POA: Insufficient documentation

## 2016-09-25 DIAGNOSIS — Z885 Allergy status to narcotic agent status: Secondary | ICD-10-CM | POA: Insufficient documentation

## 2016-09-25 DIAGNOSIS — E1152 Type 2 diabetes mellitus with diabetic peripheral angiopathy with gangrene: Principal | ICD-10-CM | POA: Insufficient documentation

## 2016-09-25 DIAGNOSIS — J449 Chronic obstructive pulmonary disease, unspecified: Secondary | ICD-10-CM | POA: Diagnosis not present

## 2016-09-25 DIAGNOSIS — L02612 Cutaneous abscess of left foot: Secondary | ICD-10-CM | POA: Diagnosis not present

## 2016-09-25 DIAGNOSIS — M1990 Unspecified osteoarthritis, unspecified site: Secondary | ICD-10-CM | POA: Diagnosis not present

## 2016-09-25 DIAGNOSIS — F329 Major depressive disorder, single episode, unspecified: Secondary | ICD-10-CM | POA: Diagnosis not present

## 2016-09-25 DIAGNOSIS — Z881 Allergy status to other antibiotic agents status: Secondary | ICD-10-CM | POA: Insufficient documentation

## 2016-09-25 DIAGNOSIS — Z886 Allergy status to analgesic agent status: Secondary | ICD-10-CM | POA: Insufficient documentation

## 2016-09-25 DIAGNOSIS — L03119 Cellulitis of unspecified part of limb: Secondary | ICD-10-CM

## 2016-09-25 DIAGNOSIS — L03116 Cellulitis of left lower limb: Secondary | ICD-10-CM | POA: Diagnosis not present

## 2016-09-25 DIAGNOSIS — L02619 Cutaneous abscess of unspecified foot: Secondary | ICD-10-CM | POA: Diagnosis present

## 2016-09-25 DIAGNOSIS — Z90710 Acquired absence of both cervix and uterus: Secondary | ICD-10-CM | POA: Insufficient documentation

## 2016-09-25 DIAGNOSIS — E875 Hyperkalemia: Secondary | ICD-10-CM | POA: Insufficient documentation

## 2016-09-25 DIAGNOSIS — J45909 Unspecified asthma, uncomplicated: Secondary | ICD-10-CM | POA: Diagnosis not present

## 2016-09-25 DIAGNOSIS — Z880 Allergy status to penicillin: Secondary | ICD-10-CM | POA: Insufficient documentation

## 2016-09-25 DIAGNOSIS — K589 Irritable bowel syndrome without diarrhea: Secondary | ICD-10-CM | POA: Insufficient documentation

## 2016-09-25 DIAGNOSIS — Z79899 Other long term (current) drug therapy: Secondary | ICD-10-CM | POA: Insufficient documentation

## 2016-09-25 DIAGNOSIS — G4710 Hypersomnia, unspecified: Secondary | ICD-10-CM | POA: Diagnosis not present

## 2016-09-25 DIAGNOSIS — I96 Gangrene, not elsewhere classified: Secondary | ICD-10-CM | POA: Insufficient documentation

## 2016-09-25 DIAGNOSIS — M65072 Abscess of tendon sheath, left ankle and foot: Secondary | ICD-10-CM

## 2016-09-25 DIAGNOSIS — I872 Venous insufficiency (chronic) (peripheral): Secondary | ICD-10-CM | POA: Insufficient documentation

## 2016-09-25 DIAGNOSIS — G4730 Sleep apnea, unspecified: Secondary | ICD-10-CM | POA: Diagnosis not present

## 2016-09-25 DIAGNOSIS — L97529 Non-pressure chronic ulcer of other part of left foot with unspecified severity: Secondary | ICD-10-CM | POA: Diagnosis not present

## 2016-09-25 DIAGNOSIS — E039 Hypothyroidism, unspecified: Secondary | ICD-10-CM | POA: Insufficient documentation

## 2016-09-25 DIAGNOSIS — Z6841 Body Mass Index (BMI) 40.0 and over, adult: Secondary | ICD-10-CM | POA: Diagnosis not present

## 2016-09-25 DIAGNOSIS — I480 Paroxysmal atrial fibrillation: Secondary | ICD-10-CM | POA: Insufficient documentation

## 2016-09-25 DIAGNOSIS — E1143 Type 2 diabetes mellitus with diabetic autonomic (poly)neuropathy: Secondary | ICD-10-CM | POA: Insufficient documentation

## 2016-09-25 DIAGNOSIS — K219 Gastro-esophageal reflux disease without esophagitis: Secondary | ICD-10-CM | POA: Insufficient documentation

## 2016-09-25 DIAGNOSIS — Z9884 Bariatric surgery status: Secondary | ICD-10-CM | POA: Insufficient documentation

## 2016-09-25 DIAGNOSIS — E785 Hyperlipidemia, unspecified: Secondary | ICD-10-CM | POA: Diagnosis not present

## 2016-09-25 DIAGNOSIS — Z86718 Personal history of other venous thrombosis and embolism: Secondary | ICD-10-CM | POA: Insufficient documentation

## 2016-09-25 DIAGNOSIS — Z8614 Personal history of Methicillin resistant Staphylococcus aureus infection: Secondary | ICD-10-CM | POA: Insufficient documentation

## 2016-09-25 DIAGNOSIS — Z8249 Family history of ischemic heart disease and other diseases of the circulatory system: Secondary | ICD-10-CM | POA: Insufficient documentation

## 2016-09-25 DIAGNOSIS — M7662 Achilles tendinitis, left leg: Secondary | ICD-10-CM | POA: Diagnosis not present

## 2016-09-25 DIAGNOSIS — Z9989 Dependence on other enabling machines and devices: Secondary | ICD-10-CM | POA: Insufficient documentation

## 2016-09-25 DIAGNOSIS — Z7984 Long term (current) use of oral hypoglycemic drugs: Secondary | ICD-10-CM | POA: Insufficient documentation

## 2016-09-25 DIAGNOSIS — I10 Essential (primary) hypertension: Secondary | ICD-10-CM | POA: Insufficient documentation

## 2016-09-25 HISTORY — PX: INCISION AND DRAINAGE OF WOUND: SHX1803

## 2016-09-25 HISTORY — PX: APPLICATION OF WOUND VAC: SHX5189

## 2016-09-25 LAB — CBC WITH DIFFERENTIAL/PLATELET
Basophils Absolute: 0.1 10*3/uL (ref 0–0.1)
Basophils Relative: 1 %
Eosinophils Absolute: 0.3 10*3/uL (ref 0–0.7)
Eosinophils Relative: 5 %
HEMATOCRIT: 36.1 % (ref 35.0–47.0)
HEMOGLOBIN: 11.7 g/dL — AB (ref 12.0–16.0)
LYMPHS ABS: 1 10*3/uL (ref 1.0–3.6)
Lymphocytes Relative: 18 %
MCH: 27.5 pg (ref 26.0–34.0)
MCHC: 32.5 g/dL (ref 32.0–36.0)
MCV: 84.6 fL (ref 80.0–100.0)
Monocytes Absolute: 0.5 10*3/uL (ref 0.2–0.9)
Monocytes Relative: 8 %
NEUTROS ABS: 3.8 10*3/uL (ref 1.4–6.5)
NEUTROS PCT: 68 %
Platelets: 258 10*3/uL (ref 150–440)
RBC: 4.27 MIL/uL (ref 3.80–5.20)
RDW: 17.4 % — ABNORMAL HIGH (ref 11.5–14.5)
WBC: 5.6 10*3/uL (ref 3.6–11.0)

## 2016-09-25 LAB — GLUCOSE, CAPILLARY
GLUCOSE-CAPILLARY: 99 mg/dL (ref 65–99)
GLUCOSE-CAPILLARY: 93 mg/dL (ref 65–99)
GLUCOSE-CAPILLARY: 112 mg/dL — AB (ref 65–99)
GLUCOSE-CAPILLARY: 124 mg/dL — AB (ref 65–99)
Glucose-Capillary: 125 mg/dL — ABNORMAL HIGH (ref 65–99)

## 2016-09-25 LAB — BASIC METABOLIC PANEL
ANION GAP: 7 (ref 5–15)
BUN: 16 mg/dL (ref 6–20)
CO2: 28 mmol/L (ref 22–32)
Calcium: 8.6 mg/dL — ABNORMAL LOW (ref 8.9–10.3)
Chloride: 105 mmol/L (ref 101–111)
Creatinine, Ser: 1.01 mg/dL — ABNORMAL HIGH (ref 0.44–1.00)
GFR calc Af Amer: >60 mL/min (ref >60)
GFR, EST NON AFRICAN AMERICAN: 56 mL/min — AB (ref >60)
Glucose, Bld: 133 mg/dL — ABNORMAL HIGH (ref 65–99)
POTASSIUM: 5.5 mmol/L — AB (ref 3.5–5.1)
SODIUM: 140 mmol/L (ref 135–145)

## 2016-09-25 SURGERY — IRRIGATION AND DEBRIDEMENT WOUND
Anesthesia: General | Laterality: Left

## 2016-09-25 MED ORDER — LORATADINE 10 MG PO TABS
10.0000 mg | ORAL_TABLET | Freq: Every evening | ORAL | Status: DC
  Administered 2016-09-25: 10 mg via ORAL
  Filled 2016-09-25 (×3): qty 1

## 2016-09-25 MED ORDER — BUPIVACAINE HCL (PF) 0.5 % IJ SOLN
INTRAMUSCULAR | Status: AC
  Filled 2016-09-25: qty 30

## 2016-09-25 MED ORDER — ROCURONIUM BROMIDE 100 MG/10ML IV SOLN
INTRAVENOUS | Status: DC | PRN
  Administered 2016-09-25: 30 mg via INTRAVENOUS

## 2016-09-25 MED ORDER — SODIUM CHLORIDE 0.9 % IV SOLN
INTRAVENOUS | Status: DC
  Administered 2016-09-25: 07:00:00 via INTRAVENOUS

## 2016-09-25 MED ORDER — FENTANYL CITRATE (PF) 100 MCG/2ML IJ SOLN
INTRAMUSCULAR | Status: AC
  Filled 2016-09-25: qty 2

## 2016-09-25 MED ORDER — ALBUTEROL SULFATE (2.5 MG/3ML) 0.083% IN NEBU: 3.0000 mL | INHALATION_SOLUTION | RESPIRATORY_TRACT | Status: DC | PRN

## 2016-09-25 MED ORDER — PROPOFOL 10 MG/ML IV BOLUS
INTRAVENOUS | Status: AC
  Filled 2016-09-25: qty 20

## 2016-09-25 MED ORDER — LIDOCAINE HCL (CARDIAC) 20 MG/ML IV SOLN
INTRAVENOUS | Status: DC | PRN
  Administered 2016-09-25: 40 mg via INTRAVENOUS

## 2016-09-25 MED ORDER — LIDOCAINE HCL (PF) 2 % IJ SOLN
INTRAMUSCULAR | Status: AC
  Filled 2016-09-25: qty 2

## 2016-09-25 MED ORDER — QUINAPRIL HCL 10 MG PO TABS
20.0000 mg | ORAL_TABLET | Freq: Every day | ORAL | Status: DC
  Filled 2016-09-25 (×2): qty 2

## 2016-09-25 MED ORDER — ALPRAZOLAM 0.5 MG PO TABS: 0.5000 mg | ORAL_TABLET | Freq: Two times a day (BID) | ORAL | Status: DC | PRN

## 2016-09-25 MED ORDER — MIDAZOLAM HCL 2 MG/2ML IJ SOLN
INTRAMUSCULAR | Status: DC | PRN
  Administered 2016-09-25: 1 mg via INTRAVENOUS

## 2016-09-25 MED ORDER — CALCIUM CARBONATE 1500 (600 CA) MG PO TABS
600.0000 mg | ORAL_TABLET | Freq: Two times a day (BID) | ORAL | Status: DC
  Administered 2016-09-25: 1500 mg via ORAL
  Filled 2016-09-25 (×3): qty 1

## 2016-09-25 MED ORDER — FENTANYL CITRATE (PF) 100 MCG/2ML IJ SOLN
INTRAMUSCULAR | Status: DC | PRN
  Administered 2016-09-25: 50 ug via INTRAVENOUS

## 2016-09-25 MED ORDER — FAMOTIDINE 20 MG PO TABS
20.0000 mg | ORAL_TABLET | Freq: Once | ORAL | Status: AC
  Administered 2016-09-25: 20 mg via ORAL

## 2016-09-25 MED ORDER — CEPHALEXIN 500 MG PO CAPS
500.0000 mg | ORAL_CAPSULE | Freq: Three times a day (TID) | ORAL | Status: DC
  Administered 2016-09-25 – 2016-09-26 (×4): 500 mg via ORAL
  Filled 2016-09-25 (×4): qty 1

## 2016-09-25 MED ORDER — FERROUS SULFATE 325 (65 FE) MG PO TABS
325.0000 mg | ORAL_TABLET | Freq: Two times a day (BID) | ORAL | Status: DC
Start: 2016-09-25 — End: 2016-09-26
  Administered 2016-09-25 – 2016-09-26 (×3): 325 mg via ORAL
  Filled 2016-09-25 (×3): qty 1

## 2016-09-25 MED ORDER — FENTANYL CITRATE (PF) 100 MCG/2ML IJ SOLN
25.0000 ug | INTRAMUSCULAR | Status: DC | PRN
  Administered 2016-09-25 (×4): 25 ug via INTRAVENOUS

## 2016-09-25 MED ORDER — PROPOFOL 10 MG/ML IV BOLUS
INTRAVENOUS | Status: DC | PRN
  Administered 2016-09-25: 100 mg via INTRAVENOUS

## 2016-09-25 MED ORDER — ONDANSETRON HCL 4 MG/2ML IJ SOLN: 4.0000 mg | Freq: Once | INTRAMUSCULAR | Status: DC | PRN

## 2016-09-25 MED ORDER — PIPERACILLIN-TAZOBACTAM 3.375 G IVPB
INTRAVENOUS | Status: AC
  Filled 2016-09-25: qty 50

## 2016-09-25 MED ORDER — OXYCODONE-ACETAMINOPHEN 7.5-325 MG PO TABS: 1.0000 | ORAL_TABLET | ORAL | 0 refills | Status: DC | PRN

## 2016-09-25 MED ORDER — OXYBUTYNIN CHLORIDE ER 5 MG PO TB24
10.0000 mg | ORAL_TABLET | Freq: Every day | ORAL | Status: DC
  Administered 2016-09-25: 10 mg via ORAL
  Filled 2016-09-25: qty 2

## 2016-09-25 MED ORDER — FENTANYL CITRATE (PF) 100 MCG/2ML IJ SOLN
INTRAMUSCULAR | Status: AC
  Administered 2016-09-25: 25 ug via INTRAVENOUS
  Filled 2016-09-25: qty 2

## 2016-09-25 MED ORDER — MIRABEGRON ER 25 MG PO TB24
25.0000 mg | ORAL_TABLET | Freq: Every day | ORAL | Status: DC
Start: 2016-09-26 — End: 2016-09-26
  Administered 2016-09-26: 25 mg via ORAL
  Filled 2016-09-25: qty 1

## 2016-09-25 MED ORDER — LEVOTHYROXINE SODIUM 150 MCG PO TABS
150.0000 ug | ORAL_TABLET | Freq: Every morning | ORAL | Status: DC
  Administered 2016-09-25: 150 ug via ORAL
  Filled 2016-09-25 (×2): qty 1

## 2016-09-25 MED ORDER — LIDOCAINE HCL (PF) 1 % IJ SOLN
INTRAMUSCULAR | Status: AC
  Filled 2016-09-25: qty 30

## 2016-09-25 MED ORDER — ONDANSETRON HCL 4 MG/2ML IJ SOLN
INTRAMUSCULAR | Status: AC
  Filled 2016-09-25: qty 2

## 2016-09-25 MED ORDER — ROCURONIUM BROMIDE 50 MG/5ML IV SOLN
INTRAVENOUS | Status: AC
  Filled 2016-09-25: qty 1

## 2016-09-25 MED ORDER — MOMETASONE FURO-FORMOTEROL FUM 200-5 MCG/ACT IN AERO
2.0000 | INHALATION_SPRAY | Freq: Two times a day (BID) | RESPIRATORY_TRACT | Status: DC
  Administered 2016-09-25 – 2016-09-26 (×2): 2 via RESPIRATORY_TRACT
  Filled 2016-09-25: qty 8.8

## 2016-09-25 MED ORDER — ALBUTEROL SULFATE (2.5 MG/3ML) 0.083% IN NEBU: 2.5000 mg | INHALATION_SOLUTION | Freq: Four times a day (QID) | RESPIRATORY_TRACT | Status: DC | PRN

## 2016-09-25 MED ORDER — SUGAMMADEX SODIUM 200 MG/2ML IV SOLN
INTRAVENOUS | Status: DC | PRN
  Administered 2016-09-25: 238.6 mg via INTRAVENOUS

## 2016-09-25 MED ORDER — BUPROPION HCL ER (XL) 300 MG PO TB24
300.0000 mg | ORAL_TABLET | Freq: Every day | ORAL | Status: DC
  Administered 2016-09-25 – 2016-09-26 (×2): 300 mg via ORAL
  Filled 2016-09-25 (×2): qty 1

## 2016-09-25 MED ORDER — INSULIN ASPART 100 UNIT/ML ~~LOC~~ SOLN: 0.0000 [IU] | Freq: Three times a day (TID) | SUBCUTANEOUS | Status: DC

## 2016-09-25 MED ORDER — MORPHINE SULFATE (PF) 2 MG/ML IV SOLN: 2.0000 mg | INTRAVENOUS | Status: DC | PRN

## 2016-09-25 MED ORDER — PANTOPRAZOLE SODIUM 40 MG PO TBEC
40.0000 mg | DELAYED_RELEASE_TABLET | Freq: Every day | ORAL | Status: DC
  Administered 2016-09-26: 40 mg via ORAL
  Filled 2016-09-25: qty 1

## 2016-09-25 MED ORDER — MIDAZOLAM HCL 2 MG/2ML IJ SOLN
INTRAMUSCULAR | Status: AC
  Filled 2016-09-25: qty 2

## 2016-09-25 MED ORDER — GABAPENTIN 400 MG PO CAPS
800.0000 mg | ORAL_CAPSULE | Freq: Four times a day (QID) | ORAL | Status: DC
  Administered 2016-09-25 – 2016-09-26 (×6): 800 mg via ORAL
  Filled 2016-09-25 (×12): qty 2

## 2016-09-25 MED ORDER — MONTELUKAST SODIUM 10 MG PO TABS
10.0000 mg | ORAL_TABLET | Freq: Every evening | ORAL | Status: DC
  Administered 2016-09-25: 10 mg via ORAL
  Filled 2016-09-25: qty 1

## 2016-09-25 MED ORDER — ONDANSETRON HCL 4 MG/2ML IJ SOLN
INTRAMUSCULAR | Status: DC | PRN
  Administered 2016-09-25: 4 mg via INTRAVENOUS

## 2016-09-25 MED ORDER — OXYCODONE-ACETAMINOPHEN 7.5-325 MG PO TABS
1.0000 | ORAL_TABLET | ORAL | Status: DC | PRN
  Administered 2016-09-25: 1 via ORAL
  Administered 2016-09-25 – 2016-09-26 (×2): 2 via ORAL
  Filled 2016-09-25 (×3): qty 2

## 2016-09-25 MED ORDER — FAMOTIDINE 20 MG PO TABS
ORAL_TABLET | ORAL | Status: AC
  Filled 2016-09-25: qty 1

## 2016-09-25 MED ORDER — METOPROLOL TARTRATE 25 MG PO TABS
25.0000 mg | ORAL_TABLET | Freq: Two times a day (BID) | ORAL | Status: DC
  Administered 2016-09-25 – 2016-09-26 (×3): 25 mg via ORAL
  Filled 2016-09-25 (×3): qty 1

## 2016-09-25 MED ORDER — SUGAMMADEX SODIUM 200 MG/2ML IV SOLN
INTRAVENOUS | Status: AC
  Filled 2016-09-25: qty 2

## 2016-09-25 MED ORDER — HYDROCODONE-ACETAMINOPHEN 5-325 MG PO TABS
1.0000 | ORAL_TABLET | Freq: Four times a day (QID) | ORAL | Status: DC | PRN
  Administered 2016-09-25: 2 via ORAL
  Filled 2016-09-25: qty 2

## 2016-09-25 MED ORDER — CYANOCOBALAMIN 500 MCG PO TABS
500.0000 ug | ORAL_TABLET | Freq: Every day | ORAL | Status: DC
  Administered 2016-09-25 – 2016-09-26 (×2): 500 ug via ORAL
  Filled 2016-09-25 (×2): qty 1

## 2016-09-25 MED ORDER — LIDOCAINE HCL 1 % IJ SOLN
INTRAMUSCULAR | Status: DC | PRN
  Administered 2016-09-25: 3.5 mL

## 2016-09-25 MED ORDER — DULOXETINE HCL 60 MG PO CPEP
60.0000 mg | ORAL_CAPSULE | Freq: Every day | ORAL | Status: DC
  Administered 2016-09-25 – 2016-09-26 (×2): 60 mg via ORAL
  Filled 2016-09-25 (×2): qty 1

## 2016-09-25 MED ORDER — BUPIVACAINE HCL (PF) 0.5 % IJ SOLN
INTRAMUSCULAR | Status: DC | PRN
  Administered 2016-09-25: 3.5 mL

## 2016-09-25 MED ORDER — SUGAMMADEX SODIUM 500 MG/5ML IV SOLN
INTRAVENOUS | Status: AC
  Filled 2016-09-25: qty 5

## 2016-09-25 MED ORDER — LACTATED RINGERS IV SOLN
INTRAVENOUS | Status: DC | PRN
  Administered 2016-09-25: 08:00:00 via INTRAVENOUS

## 2016-09-25 MED ORDER — CIPROFLOXACIN HCL 500 MG PO TABS
500.0000 mg | ORAL_TABLET | Freq: Two times a day (BID) | ORAL | Status: DC
  Administered 2016-09-25 – 2016-09-26 (×3): 500 mg via ORAL
  Filled 2016-09-25 (×3): qty 1

## 2016-09-25 MED ORDER — METFORMIN HCL 500 MG PO TABS
500.0000 mg | ORAL_TABLET | Freq: Two times a day (BID) | ORAL | Status: DC
  Administered 2016-09-25 – 2016-09-26 (×2): 500 mg via ORAL
  Filled 2016-09-25 (×4): qty 1

## 2016-09-25 SURGICAL SUPPLY — 43 items
BAG COUNTER SPONGE EZ (MISCELLANEOUS) IMPLANT
BAG SPNG 4X4 CLR HAZ (MISCELLANEOUS)
BANDAGE ELASTIC 3 LF NS (GAUZE/BANDAGES/DRESSINGS) ×2 IMPLANT
BANDAGE ELASTIC 4 LF NS (GAUZE/BANDAGES/DRESSINGS) ×2 IMPLANT
BANDAGE STRETCH 3X4.1 STRL (GAUZE/BANDAGES/DRESSINGS) ×2 IMPLANT
BNDG CMPR MED 5X3 ELC HKLP NS (GAUZE/BANDAGES/DRESSINGS) ×1
BNDG CMPR MED 5X4 ELC HKLP NS (GAUZE/BANDAGES/DRESSINGS) ×1
BNDG ESMARK 4X12 TAN STRL LF (GAUZE/BANDAGES/DRESSINGS) ×2 IMPLANT
BNDG GAUZE 4.5X4.1 6PLY STRL (MISCELLANEOUS) ×2 IMPLANT
CANISTER SUCT 1200ML W/VALVE (MISCELLANEOUS) ×2 IMPLANT
CUFF TOURN 18 STER (MISCELLANEOUS) ×1 IMPLANT
CUFF TOURN 34 STER (MISCELLANEOUS) ×2 IMPLANT
CUFF TOURN DUAL PL 12 NO SLV (MISCELLANEOUS) ×1 IMPLANT
DRAPE FLUOR MINI C-ARM 54X84 (DRAPES) ×1 IMPLANT
DURAPREP 26ML APPLICATOR (WOUND CARE) ×2 IMPLANT
ELECT REM PT RETURN 9FT ADLT (ELECTROSURGICAL) ×2
ELECTRODE REM PT RTRN 9FT ADLT (ELECTROSURGICAL) ×1 IMPLANT
GAUZE PETRO XEROFOAM 1X8 (MISCELLANEOUS) ×1 IMPLANT
GAUZE SPONGE 4X4 12PLY STRL (GAUZE/BANDAGES/DRESSINGS) ×2 IMPLANT
GLOVE BIO SURGEON STRL SZ8 (GLOVE) ×2 IMPLANT
GLOVE INDICATOR 8.0 STRL GRN (GLOVE) ×2 IMPLANT
GOWN STRL REUS W/ TWL LRG LVL3 (GOWN DISPOSABLE) ×1 IMPLANT
GOWN STRL REUS W/TWL LRG LVL3 (GOWN DISPOSABLE) ×2
KIT RM TURNOVER STRD PROC AR (KITS) ×2 IMPLANT
LABEL OR SOLS (LABEL) ×1 IMPLANT
NDL FILTER BLUNT 18X1 1/2 (NEEDLE) ×1 IMPLANT
NDL HYPO 25X1 1.5 SAFETY (NEEDLE) ×2 IMPLANT
NEEDLE FILTER BLUNT 18X 1/2SAF (NEEDLE) ×1
NEEDLE FILTER BLUNT 18X1 1/2 (NEEDLE) ×1 IMPLANT
NEEDLE HYPO 25X1 1.5 SAFETY (NEEDLE) ×4 IMPLANT
NS IRRIG 500ML POUR BTL (IV SOLUTION) ×2 IMPLANT
PACK EXTREMITY ARMC (MISCELLANEOUS) ×2 IMPLANT
STOCKINETTE STRL 6IN 960660 (GAUZE/BANDAGES/DRESSINGS) ×2 IMPLANT
SUT ETHILON 3-0 FS-10 30 BLK (SUTURE) ×2
SUT ETHILON 4-0 (SUTURE) ×2
SUT ETHILON 4-0 FS2 18XMFL BLK (SUTURE) ×1
SUT VIC AB 3-0 SH 27 (SUTURE) ×2
SUT VIC AB 3-0 SH 27X BRD (SUTURE) ×1 IMPLANT
SUT VIC AB 4-0 FS2 27 (SUTURE) ×1 IMPLANT
SUTURE EHLN 3-0 FS-10 30 BLK (SUTURE) ×1 IMPLANT
SUTURE ETHLN 4-0 FS2 18XMF BLK (SUTURE) ×1 IMPLANT
SYR 3ML LL SCALE MARK (SYRINGE) IMPLANT
SYRINGE 10CC LL (SYRINGE) ×2 IMPLANT

## 2016-09-25 NOTE — Anesthesia Procedure Notes (Signed)
Date/Time: 09/25/2016 8:13 AM Performed by: Allean Found

## 2016-09-25 NOTE — Anesthesia Post-op Follow-up Note (Cosign Needed)
Anesthesia QCDR form completed.        

## 2016-09-25 NOTE — Transfer of Care (Signed)
Immediate Anesthesia Transfer of Care Note  Patient: [name]  Procedure(s) Performed: Procedure(s): IRRIGATION AND DEBRIDEMENT - PARTIAL RESECTION OF ACHILLES TENDON WITH WOUND VAC APPLICATION (Left) APPLICATION OF WOUND VAC (Left)  Patient Location: PACU  Anesthesia Type:General  Level of Consciousness: awake  Airway & Oxygen Therapy: Patient Spontanous Breathing and Patient connected to face mask oxygen  Post-op Assessment: Report given to RN and Post -op Vital signs reviewed and stable  Post vital signs: Reviewed and stable  Last Vitals:  Vitals:   09/25/16 0627 09/25/16 0842  BP: (!) 157/78   Pulse: 67   Resp: 20 (!) 21  Temp: (!) 36.3 C (!) 36.2 C    Last Pain:  Vitals:   09/25/16 0627  TempSrc: Tympanic  PainSc: 4          Complications: No apparent anesthesia complications

## 2016-09-25 NOTE — Anesthesia Procedure Notes (Signed)
Procedure Name: Intubation Date/Time: 09/25/2016 7:45 AM Performed by: Allean Found Pre-anesthesia Checklist: Patient identified, Emergency Drugs available, Suction available, Patient being monitored and Timeout performed Patient Re-evaluated:Patient Re-evaluated prior to inductionOxygen Delivery Method: Circle system utilized Preoxygenation: Pre-oxygenation with 100% oxygen Intubation Type: IV induction Ventilation: Oral airway inserted - appropriate to patient size Laryngoscope Size: Mac and 3 Grade View: Grade II Tube type: Oral Tube size: 7.0 mm Number of attempts: 1 Airway Equipment and Method: Stylet Placement Confirmation: ETT inserted through vocal cords under direct vision,  positive ETCO2 and breath sounds checked- equal and bilateral Secured at: 22 cm Tube secured with: Tape Dental Injury: Teeth and Oropharynx as per pre-operative assessment

## 2016-09-25 NOTE — Progress Notes (Signed)
Pt placed on home CPAP unit. CPAP plugged into red outlet.

## 2016-09-25 NOTE — Care Management Note (Signed)
Case Management Note  Patient Details  Name: [name] MRN: [id number] Date of Birth: 06-15-1950  Subjective/Objective:  [facility] consult received for discharge planning needs. Spoke with patient regarding home health. She is agreeable. Will discharge home with a wound vac. Has diabetic foot wound with necrotic Achilles tendon  Prefers [facility]. She lives at home with her spouse. Uses a cane and a walker. Is able to provide her own adls. PCP is Dr. Lacinda Axon. Will follow progression.                   Action/Plan:   Expected Discharge Date:                  Expected Discharge Plan:  [facility]  In-House Referral:     Discharge planning Services  CM Consult  Post Acute Care Choice:  Home Health Choice offered to:  Patient  DME Arranged:    DME Agency:     HH Arranged:  PT, RN HH Agency:  [facility]  Status of Service:  In process, will continue to follow  If discussed at Long Length of Stay Meetings, dates discussed:    Additional Comments:  Jolly Mango, RN 09/25/2016, 3:06 PM

## 2016-09-25 NOTE — Consult Note (Signed)
[facility] NAME: [name]    MR#:  [id number]  DATE OF BIRTH:  [birth year]  DATE OF ADMISSION:  09/25/2016  PRIMARY CARE PHYSICIAN: [name], DO   CONSULT REQUESTING/REFERRING PHYSICIAN: Dr. [name]  REASON FOR CONSULT: Afib, HNT, DM  CHIEF COMPLAINT:  No chief complaint on file.  Left ankle pain  HISTORY OF PRESENT ILLNESS:  66 year old female with an extensive PMH including DM-2 with complications, PAD, HTN, Afib, Asthma, Chronic pain, GERD, Morbid obesity presents to the hospital for debridement and resection of left achillis tendon and wound vac placement by Podiatry.  POD - 0  Patient admitted from 6/26 - 6/20.  She presented with severe left lower extremity pain (posterior ankle/achilles). Found to have cellulitis and abscess. Started on IV antibiotics. Podiatry consulted and she underwent I & D. Culture revealed MSSA. Discharged home on PO Keflex.  Had ID/Podiatry follow up scheduled.   Due to no improvement she has returned for resection and debridement achillis tendon.  Last dose of pradaxa was yesterday.  Doesn't want insulin the hospital. Wants to continue metformin  PAST MEDICAL HISTORY:   Past Medical History:  Diagnosis Date  . Anxiety and depression   . Arthritis   . Asthma   . Cystocele   . Depression   . Diabetes ([place])   . DVT (deep venous thrombosis) (HCC)    Righ calf  . GERD (gastroesophageal reflux disease)   . Heart murmur    Echocardiogram June 2015: Mild MR (possible vegetation seen on TTE, not seen on TEE), normal LV size with moderate concentric LVH. Normal function EF 60-65%. Normal diastolic function. Mild LA dilation.  Marland Kitchen History of IBS   . History of methicillin resistant staphylococcus aureus (MRSA) 2008  . Hypothyroidism   . Insomnia   . Kidney stone    kidney stones with lithotripsy  . Neuropathy involving both lower extremities   . Obesity, Class II, BMI 35-39.9, with  comorbidity   . Paroxysmal atrial fibrillation ([place]) 2007   In June 2015, Cardiac Event Monitor: Mostly SR/sinus arrhythmia with PVCs that are frequent. Short bursts of A. fib lasting several minutes;; CHA2DS2-VASc Score = 5 (age, Female, PVD, DM, HTN)  . Peripheral vascular disease ([place])   . Sleep apnea    use C-PAP  . Venous stasis dermatitis of both lower extremities     PAST SURGICAL HISTOIRY:   Past Surgical History:  Procedure Laterality Date  . ANKLE SURGERY Right   . APPLICATION OF WOUND VAC Left 06/27/2015   Procedure: APPLICATION OF WOUND VAC ( POSSIBLE ) ;  Surgeon: [name], MD;  Location: [facility] ORS;  Service: Vascular;  Laterality: Left;  . arm surgery     right    . CHOLECYSTECTOMY    . HERNIA REPAIR     umbilical  . HIATAL HERNIA REPAIR    . I&D EXTREMITY Left 06/27/2015   Procedure: IRRIGATION AND DEBRIDEMENT EXTREMITY            ( CALF HEMATOMA ) POSSIBLE WOUND VAC;  Surgeon: [name], MD;  Location: [facility] ORS;  Service: Vascular;  Laterality: Left;  . IRRIGATION AND DEBRIDEMENT ABSCESS Left 09/07/2016   Procedure: IRRIGATION AND DEBRIDEMENT ABSCESS LEFT HEEL;  Surgeon: [name], DPM;  Location: [facility] ORS;  Service: Podiatry;  Laterality: Left;  . JOINT REPLACEMENT     left knee replacement  . KIDNEY SURGERY Right  kidney stones  . LITHOTRIPSY    . NM [facility] HX)  February 2017   Likely breast attenuation. LOW RISK study. Normal EF 55-60%.  . removal of left hematoma Left    leg  . REVERSE SHOULDER ARTHROPLASTY Right 10/08/2015   Procedure: REVERSE SHOULDER ARTHROPLASTY;  Surgeon: [name], MD;  Location: [facility] ORS;  Service: Orthopedics;  Laterality: Right;  . SLEEVE GASTROPLASTY    . TONSILLECTOMY    . TOTAL KNEE ARTHROPLASTY    . TRANSESOPHAGEAL ECHOCARDIOGRAM  08/08/2013   Mild LVH, EF 60-65%. Moderate LA dilation and mild RA dilation. Mild MR with no evidence of stenosis and no evidence of endocarditis. A false chordae is noted.  .  TRANSTHORACIC ECHOCARDIOGRAM  08/03/2013   Mild-Moderate concentric LVH, EF 60-65%. Normal diastolic function. Mild LA dilation. Mild MR with possible vegetation  - not confirmed on TEE   . ULNAR NERVE TRANSPOSITION Right 08/06/2016   Procedure: ULNAR NERVE DECOMPRESSION/TRANSPOSITION;  Surgeon: [name], MD;  Location: [facility] ORS;  Service: Orthopedics;  Laterality: Right;  Marland Kitchen VAGINAL HYSTERECTOMY      SOCIAL HISTORY:   Social History  Substance Use Topics  . Smoking status: Never Smoker  . Smokeless tobacco: Never Used  . Alcohol use No    FAMILY HISTORY:   Family History  Problem Relation Age of Onset  . Skin cancer Father   . Diabetes Father   . Hypertension Father   . Peripheral vascular disease Father   . Cancer Father   . Cerebral aneurysm Father   . Alcohol abuse Father   . Varicose Veins Mother   . Kidney disease Mother   . Arthritis Mother   . Mental illness Sister   . Cancer Maternal Aunt        breast  . Breast cancer Maternal Aunt   . Arthritis Maternal Grandmother   . Hypertension Maternal Grandmother   . Diabetes Maternal Grandmother   . Arthritis Maternal Grandfather   . Heart disease Maternal Grandfather   . Hypertension Maternal Grandfather   . Arthritis Paternal Grandmother   . Hypertension Paternal Grandmother   . Diabetes Paternal Grandmother   . Arthritis Paternal Grandfather   . Hypertension Paternal Grandfather     DRUG ALLERGIES:   Allergies  Allergen Reactions  . Penicillins Hives, Shortness Of Breath and Swelling    Facial swelling Has patient had a PCN reaction causing immediate rash, facial/tongue/throat swelling, SOB or lightheadedness with hypotension: Yes Has patient had a PCN reaction causing severe rash involving mucus membranes or skin necrosis: Yes Has patient had a PCN reaction that required hospitalization No Has patient had a PCN reaction occurring within the last 10 years: No If all of the above answers are "NO", then  may proceed with Cephalosporin use.   . Aspirin Hives  . Terramycin [Oxytetracycline] Hives    REVIEW OF SYSTEMS:   ROS  CONSTITUTIONAL: No fever, fatigue or weakness.  EYES: No blurred or double vision.  EARS, NOSE, AND THROAT: No tinnitus or ear pain.  RESPIRATORY: No cough, shortness of breath, wheezing or hemoptysis.  CARDIOVASCULAR: No chest pain, orthopnea, edema.  GASTROINTESTINAL: No nausea, vomiting, diarrhea or abdominal pain.  GENITOURINARY: No dysuria, hematuria.  ENDOCRINE: No polyuria, nocturia,  HEMATOLOGY: No anemia, easy bruising or bleeding SKIN: No rash or lesion. MUSCULOSKELETAL: Left ankle pain NEUROLOGIC: No tingling, numbness, weakness.  PSYCHIATRY: No anxiety or depression.   MEDICATIONS AT HOME:   Prior to Admission medications  Medication Sig Start Date End Date Taking? Authorizing Provider  acetaminophen (TYLENOL) 500 MG tablet Take 1,000 mg by mouth every 8 (eight) hours as needed for mild pain.   Yes [provider]  albuterol (PROVENTIL HFA;VENTOLIN HFA) 108 (90 Base) MCG/ACT inhaler Inhale 1-2 puffs into the lungs every 4 (four) hours as needed for wheezing or shortness of breath. Patient taking differently: Inhale 2 puffs into the lungs every 4 (four) hours as needed for wheezing or shortness of breath.  03/29/15  Yes Bobetta Lime, MD  albuterol (PROVENTIL) (2.5 MG/3ML) 0.083% nebulizer solution Take 3 mLs (2.5 mg total) by nebulization every 6 (six) hours as needed for wheezing or shortness of breath. 02/21/15  Yes Ashok Norris, MD  ALPRAZolam Duanne Moron) 0.5 MG tablet TAKE ONE TABLET TWICE DAILY AS NEEDED 07/24/16  Yes Cook, Jayce G, DO  buPROPion (WELLBUTRIN XL) 300 MG 24 hr tablet TAKE ONE TABLET BY MOUTH EVERY DAY 07/22/16  Yes Cook, Jayce G, DO  calcium carbonate (OSCAL) 1500 (600 Ca) MG TABS tablet Take 600 mg of elemental calcium by mouth 2 (two) times daily with a meal.   Yes [provider]  cephALEXin (KEFLEX) 500 MG  capsule Take 1 capsule (500 mg total) by mouth every 8 (eight) hours. 09/09/16 09/25/16 Yes Fritzi Mandes, MD  ciprofloxacin (CIPRO) 500 MG tablet Take 500 mg by mouth 2 (two) times daily.  09/21/16 10/01/16 Yes [provider]  cyanocobalamin 500 MCG tablet Take 500 mcg by mouth daily.   Yes [provider]  dabigatran (PRADAXA) 150 MG CAPS capsule TAKE 1 CAPSULE BY MOUTH TWICE DAILY 04/24/16  Yes Cook, Jayce G, DO  Dexlansoprazole 30 MG capsule Take 1 capsule (30 mg total) by mouth daily. 06/30/16  Yes Cook, Jayce G, DO  DULoxetine (CYMBALTA) 60 MG capsule Take 1 capsule (60 mg total) by mouth daily. 09/13/16 12/12/16 Yes King, Diona Foley, NP  ESTRACE VAGINAL 0.1 MG/GM vaginal cream INSERT 1 GRAM VAGINALLY TWICE A WEEK Patient taking differently: INSERT 1 GRAM VAGINALLY TWICE A WEEK (Tuesday and Thursday) 09/18/15  Yes Thersa Salt G, DO  ferrous sulfate 325 (65 FE) MG tablet Take 1 tablet (325 mg total) by mouth 2 (two) times daily. 07/03/16  Yes Cook, Jayce G, DO  Fluticasone-Salmeterol (ADVAIR) 250-50 MCG/DOSE AEPB Inhale 1 puff into the lungs 2 (two) times daily.   Yes [provider]  gabapentin (NEURONTIN) 800 MG tablet Take 1 tablet (800 mg total) by mouth 4 (four) times daily. 09/13/16 12/12/16 Yes Vevelyn Francois, NP  HYDROcodone-acetaminophen (NORCO/VICODIN) 5-325 MG tablet Take 1-2 tablets by mouth every 6 (six) hours as needed for severe pain. 09/09/16 10/09/16 Yes Fritzi Mandes, MD  hydrocortisone cream 1 % Apply 1 application topically daily as needed for itching.   Yes [provider]  levocetirizine (XYZAL) 5 MG tablet Take 1 tablet (5 mg total) by mouth every evening. 07/07/16  Yes Cook, Jayce G, DO  levothyroxine (SYNTHROID, LEVOTHROID) 150 MCG tablet TAKE ONE TABLET BY MOUTH EVERY MORNING 07/22/16  Yes Cook, Jayce G, DO  meloxicam (MOBIC) 15 MG tablet Take 15 mg by mouth daily.  08/22/15  Yes [provider]  metFORMIN (GLUCOPHAGE) 1000 MG tablet TAKE ONE TABLET  BY MOUTH TWICE DAILY Patient taking differently: TAKE ONE TABLET IN THE MORNING AND HALF TABLET AT NIGHT 05/21/16  Yes Crecencio Mc, MD  metoprolol tartrate (LOPRESSOR) 25 MG tablet TAKE ONE TABLET TWICE DAILY 07/01/16  Yes Lacinda Axon, Jayce G, DO  mirabegron  ER (MYRBETRIQ) 25 MG TB24 tablet Take 1 tablet (25 mg total) by mouth daily. 12/05/15  Yes McGowan, [name] A, PA-C  montelukast (SINGULAIR) 10 MG tablet TAKE ONE TABLET BY MOUTH EVERY DAY Patient taking differently: TAKE ONE TABLET BY MOUTH EVERY evening 04/24/16  Yes Cook, Jayce G, DO  Multiple Vitamin (MULTIVITAMIN WITH MINERALS) TABS tablet Take 1 tablet by mouth daily.   Yes [provider]  oxybutynin (DITROPAN-XL) 10 MG 24 hr tablet Take 1 tablet (10 mg total) by mouth daily. Patient taking differently: Take 10 mg by mouth at bedtime.  12/05/15  Yes McGowan, [name] A, PA-C  quinapril (ACCUPRIL) 20 MG tablet TAKE ONE TABLET EVERY DAY 05/26/16  Yes Lacinda Axon, Jayce G, DO  fluticasone-salmeterol (ADVAIR HFA) 115-21 MCG/ACT inhaler Inhale 2 puffs into the lungs 2 (two) times daily. Patient not taking: Reported on 09/24/2016 10/13/15   Nicholes Mango, MD  ipratropium (ATROVENT) 0.06 % nasal spray Place 2 sprays into both nostrils 4 (four) times daily. Patient not taking: Reported on 09/05/2016 06/09/16   [name], DO  oxyCODONE-acetaminophen (PERCOCET) 7.5-325 MG tablet Take 1 tablet by mouth every 4 (four) hours as needed for severe pain. 09/25/16   Troxler, Rodman Key, DPM      VITAL SIGNS:  Blood pressure (!) 109/46, pulse 66, temperature (!) 97.4 F (36.3 C), resp. rate 17, SpO2 97 %.  PHYSICAL EXAMINATION:  GENERAL:  66 y.o.-year-old patient lying in the bed with no acute distress. Obese EYES: Pupils equal, round, reactive to light and accommodation. No scleral icterus. Extraocular muscles intact.  HEENT: Head atraumatic, normocephalic. Oropharynx and nasopharynx clear.  NECK:  Supple, no jugular venous distention. No thyroid enlargement, no  tenderness.  LUNGS: Normal breath sounds bilaterally, no wheezing, rales,rhonchi or crepitation. No use of accessory muscles of respiration.  CARDIOVASCULAR: S1, S2 normal. No murmurs, rubs, or gallops.  ABDOMEN: Soft, nontender, nondistended. Bowel sounds present. No organomegaly or mass.  EXTREMITIES: No pedal edema, cyanosis, or clubbing.  NEUROLOGIC: Cranial nerves II through XII are intact. Muscle strength 5/5 in all extremities. Sensation intact. Gait not checked.  PSYCHIATRIC: The patient is alert and oriented x 3.  SKIN: No obvious rash, lesion, or ulcer.  Left foot wound vac  LABORATORY PANEL:   CBC No results for input(s): WBC, HGB, HCT, PLT in the last 168 hours. ------------------------------------------------------------------------------------------------------------------  Chemistries  No results for input(s): NA, K, CL, CO2, GLUCOSE, BUN, CREATININE, CALCIUM, MG, AST, ALT, ALKPHOS, BILITOT in the last 168 hours.  Invalid input(s): GFRCGP ------------------------------------------------------------------------------------------------------------------  Cardiac Enzymes No results for input(s): TROPONINI in the last 168 hours. ------------------------------------------------------------------------------------------------------------------  RADIOLOGY:  No results found.  EKG:   Orders placed or performed during the hospital encounter of 09/05/16  . ED EKG  . ED EKG  . EKG 12-Lead  . EKG 12-Lead    IMPRESSION AND PLAN:   * Left achillis tendon infection S/p debridement and vac placed. On PO abx Per podiatry  * DM2 Metformin held SSI ADA diet  * Afib Continue metoprolol Pradaxa held. Restart likely tomorrow if no further surgeries planned.  * HTN Cot[place]ue home meds  * DVT prophylaxis Lovenox if pradaxa not started.  All the records are reviewed and case discussed with Consulting provider. Management plans discussed with the patient, family and  they are in agreement.  CODE STATUS: FULL CODE  TOTAL TIME TAKING CARE OF THIS PATIENT: 40 minutes.    Hillary Bow R M.D on 09/25/2016 at 10:20 AM  Between 7am to  6pm - Pager - [phone]  After 6pm go to [email or web address] - password [email or web address] [facility]  Office  [phone]  CC: Primary care Physician: [name], DO  Note: This dictation was prepared with Dragon dictation along with smaller phrase technology. Any transcriptional errors that result from this process are unintentional.

## 2016-09-25 NOTE — Anesthesia Postprocedure Evaluation (Signed)
Anesthesia Post Note  Patient: [name]  Procedure(s) Performed: Procedure(s) (LRB): IRRIGATION AND DEBRIDEMENT - PARTIAL RESECTION OF ACHILLES TENDON WITH WOUND VAC APPLICATION (Left) APPLICATION OF WOUND VAC (Left)  Patient location during evaluation: PACU Anesthesia Type: General Level of consciousness: awake and alert Pain management: pain level controlled Vital Signs Assessment: post-procedure vital signs reviewed and stable Respiratory status: spontaneous breathing, nonlabored ventilation, respiratory function stable and patient connected to nasal cannula oxygen Cardiovascular status: blood pressure returned to baseline and stable Postop Assessment: no signs of nausea or vomiting Anesthetic complications: no     Last Vitals:  Vitals:   09/25/16 0926 09/25/16 0941  BP: (!) 117/44 (!) 109/46  Pulse: 69 66  Resp: 18 17  Temp:  (!) 36.3 C    Last Pain:  Vitals:   09/25/16 0941  TempSrc:   PainSc: 3                  Martha Clan

## 2016-09-25 NOTE — Care Management Obs Status (Signed)
[facility] NOTIFICATION   Patient Details  Name: [name] MRN: [id number] Date of Birth: 14-Mar-1951   Medicare Observation Status Notification Given:  Yes    Jolly Mango, RN 09/25/2016, 12:05 PM

## 2016-09-25 NOTE — Anesthesia Preprocedure Evaluation (Signed)
Anesthesia Evaluation  Patient identified by MRN, date of birth, ID band Patient awake    Reviewed: Allergy & Precautions, NPO status , Patient's Chart, lab work & pertinent test results, reviewed documented beta blocker date and time   History of Anesthesia Complications Negative for: history of anesthetic complications  Airway Mallampati: II  TM Distance: >3 FB Neck ROM: Full    Dental  (+) Chipped, Dental Advidsory Given 2 crowns on lower molars :   Pulmonary shortness of breath and with exertion, asthma , sleep apnea and Continuous Positive Airway Pressure Ventilation , COPD,  COPD inhaler, neg recent URI,           Cardiovascular Exercise Tolerance: Good hypertension, Pt. on home beta blockers (-) angina+ Peripheral Vascular Disease and + DOE  (-) CAD, (-) Past MI, (-) Cardiac Stents and (-) CABG + dysrhythmias (atrial fibrillation, on pradaxea, held since 7/15) Atrial Fibrillation + Valvular Problems/Murmurs  - Systolic murmurs    Neuro/Psych neg Seizures PSYCHIATRIC DISORDERS  Neuromuscular disease    GI/Hepatic Neg liver ROS, GERD  Controlled,  Endo/Other  diabetes, Well Controlled, Type 2, Oral Hypoglycemic AgentsHypothyroidism (synthroid for replacement) Morbid obesity  Renal/GU Renal disease (nephrolithiasis)  negative genitourinary   Musculoskeletal  (+) Arthritis , Osteoarthritis,    Abdominal (+) + obese,   Peds  Hematology negative hematology ROS (+)   Anesthesia Other Findings Past Medical History: No date: Anxiety and depression No date: Arthritis No date: Asthma No date: Cystocele No date: Depression No date: Diabetes ([place]) No date: DVT (deep venous thrombosis) (HCC)     Comment: Righ calf No date: GERD (gastroesophageal reflux disease) No date: Heart murmur     Comment: Echocardiogram June 2015: Mild MR (possible               vegetation seen on TTE, not seen on TEE),               normal LV  size with moderate concentric LVH.               Normal function EF 60-65%. Normal diastolic               function. Mild LA dilation. No date: History of IBS 2008: History of methicillin resistant staphylococcu* No date: Hypothyroidism No date: Insomnia No date: Kidney stone     Comment: kidney stones with lithotripsy No date: Neuropathy involving both lower extremities No date: Obesity, Class II, BMI 35-39.9, with comorbidi* 2007: Paroxysmal atrial fibrillation ([place])     Comment: In June 2015, Cardiac Event Monitor: Mostly               SR/sinus arrhythmia with PVCs that are               frequent. Short bursts of A. fib lasting               several minutes;; CHA2DS2-VASc Score = 5 (age,               Female, PVD, DM, HTN) No date: Peripheral vascular disease ([place]) No date: Sleep apnea     Comment: use C-PAP No date: Venous stasis dermatitis of both lower extremi*   Reproductive/Obstetrics                             Anesthesia Physical  Anesthesia Plan  ASA: III  Anesthesia Plan: General   Post-op Pain Management:    Induction:  Intravenous  PONV Risk Score and Plan: 3 and Ondansetron and Dexamethasone  Airway Management Planned: Oral ETT  Additional Equipment:   Intra-op Plan:   Post-operative Plan: Extubation in OR  Informed Consent: I have reviewed the patients History and Physical, chart, labs and discussed the procedure including the risks, benefits and alternatives for the proposed anesthesia with the patient or authorized representative who has indicated his/her understanding and acceptance.   Dental advisory given  Plan Discussed with: Surgeon and CRNA  Anesthesia Plan Comments:         Anesthesia Quick Evaluation

## 2016-09-25 NOTE — Op Note (Signed)
Operative note   Surgeon: Dr. [name], DPM.    Assistant: None    Preop diagnosis: Diabetic foot wound with necrotic Achilles tendon    Postop diagnosis: Same    Procedure:   1. Resection of necrotic Achilles tendon with debridement of wound excisional and application of wound VAC           EBL: Approximately 15 cc    Anesthesia:general delivered by the anesthesia obtained with local anesthetic delivered by me consisting of 8 cc of lidocaine and Marcaine 5050 mixture    Hemostasis: Thigh tourniquet at 325 mg pressure for 30 minutes    Specimen: Necrotic Achilles tendon left heel    Complications: None    Operative indications: Diabetic wound with necrotic Achilles tendon preventing closure of the wound itself.    Procedure:  Patient was brought into the OR and placed on the operating table in thesupine position. After anesthesia was obtained theleft lower extremity was prepped and draped in usual sterile fashion.  Operative Report: This time attention directed to the posterior left Achilles tendon where a 6.5 cm wound was noted in length with about 4 cm in width with exposed necrotic Achilles tendon. This point the wound was extended proximally and distally for 2 cm Direction. The Achilles tendon was then isolated and separated the large portion of the tendon and the wound was necrotic I debrided part of it than better tendon structures were noted in tear fairly but would likely become necrotic due to lack of blood flow and lack of ability to get granulation tissue across the area. At this point I elected to resect the Achilles tendon but approximately a 5 cm portion of the tendon getting to healthier tissue at the Institute incision margins both rocks landed distally. Once this was removed the area was copiously irrigated and no necrotic tissue was present. Closure to the distal and proximal portions wounds with 4-0 nylon simple ruptured sutures. This left about a 3 cm open  wound that was packed with a wound VAC sponge and wound VAC was applied 125 mmHg pressure continuous she is point to stay overnight for observation should be undergone home. Further dressings placed across the area with gauze pads and an Ace wrap to give her more cushioning around the wound VAC hose.    Patient tolerated the procedure and anesthesia well.  Was transported from the OR to the PACU with all vital signs stable and vascular status intact. To be discharged per routine protocol.  Will follow up in approximately 1 week in the outpatient clinic.

## 2016-09-25 NOTE — Progress Notes (Signed)
Admission: Patient alert and oriented. Left foot wrapped in ace wrap. Prevena wound vac in place. Patient not complaining of any pain at this time other than where the MD placed a tourniquet on her left thigh during surgery. Patient toes dusky, weak pulses. Patient has personal CPAP in room to  Use at night time. Patient on 2LO2. Lung and heart sounds normal. Oriented to room and call bell system.   Deri Fuelling, RN

## 2016-09-25 NOTE — H&P (Signed)
H and P has been reviewed and no changes are noted. Pt. States she has taken Augmentin and ampicillin before with no problems.  I reiterated to the pt. Her [potenmtial complications and she again states she wants to proceed with resection of tendon and wound vac.  I explained to her that she will have increased weakness and will need to use a walker or cane for the foreseeable future.  She understands this and wants to proceed.

## 2016-09-26 DIAGNOSIS — I4891 Unspecified atrial fibrillation: Secondary | ICD-10-CM | POA: Diagnosis not present

## 2016-09-26 DIAGNOSIS — E069 Thyroiditis, unspecified: Secondary | ICD-10-CM | POA: Diagnosis not present

## 2016-09-26 DIAGNOSIS — L0390 Cellulitis, unspecified: Secondary | ICD-10-CM | POA: Diagnosis not present

## 2016-09-26 DIAGNOSIS — Z96619 Presence of unspecified artificial shoulder joint: Secondary | ICD-10-CM | POA: Diagnosis not present

## 2016-09-26 DIAGNOSIS — I10 Essential (primary) hypertension: Secondary | ICD-10-CM | POA: Diagnosis not present

## 2016-09-26 DIAGNOSIS — M129 Arthropathy, unspecified: Secondary | ICD-10-CM | POA: Diagnosis not present

## 2016-09-26 DIAGNOSIS — R0602 Shortness of breath: Secondary | ICD-10-CM | POA: Diagnosis not present

## 2016-09-26 DIAGNOSIS — J45909 Unspecified asthma, uncomplicated: Secondary | ICD-10-CM | POA: Diagnosis not present

## 2016-09-26 DIAGNOSIS — L02611 Cutaneous abscess of right foot: Secondary | ICD-10-CM | POA: Diagnosis not present

## 2016-09-26 DIAGNOSIS — E1152 Type 2 diabetes mellitus with diabetic peripheral angiopathy with gangrene: Secondary | ICD-10-CM | POA: Diagnosis not present

## 2016-09-26 DIAGNOSIS — E119 Type 2 diabetes mellitus without complications: Secondary | ICD-10-CM | POA: Diagnosis not present

## 2016-09-26 LAB — POTASSIUM
POTASSIUM: 5.1 mmol/L (ref 3.5–5.1)
Potassium: 5.4 mmol/L — ABNORMAL HIGH (ref 3.5–5.1)

## 2016-09-26 LAB — GLUCOSE, CAPILLARY
GLUCOSE-CAPILLARY: 131 mg/dL — AB (ref 65–99)
GLUCOSE-CAPILLARY: 129 mg/dL — AB (ref 65–99)
GLUCOSE-CAPILLARY: 121 mg/dL — AB (ref 65–99)

## 2016-09-26 MED ORDER — SODIUM POLYSTYRENE SULFONATE 15 GM/60ML PO SUSP
30.0000 g | Freq: Once | ORAL | Status: AC
  Administered 2016-09-26: 30 g via ORAL
  Filled 2016-09-26: qty 120

## 2016-09-26 MED ORDER — DABIGATRAN ETEXILATE MESYLATE 150 MG PO CAPS
150.0000 mg | ORAL_CAPSULE | Freq: Two times a day (BID) | ORAL | Status: DC
  Administered 2016-09-26: 150 mg via ORAL
  Filled 2016-09-26 (×2): qty 1

## 2016-09-26 MED ORDER — INSULIN ASPART 100 UNIT/ML ~~LOC~~ SOLN
0.0000 [IU] | Freq: Three times a day (TID) | SUBCUTANEOUS | Status: DC
  Administered 2016-09-26: 2 [IU] via SUBCUTANEOUS
  Filled 2016-09-26: qty 1

## 2016-09-26 MED ORDER — ADULT MULTIVITAMIN W/MINERALS CH
1.0000 | ORAL_TABLET | Freq: Every day | ORAL | Status: DC
  Administered 2016-09-26: 1 via ORAL
  Filled 2016-09-26: qty 1

## 2016-09-26 MED ORDER — MELOXICAM 7.5 MG PO TABS
15.0000 mg | ORAL_TABLET | Freq: Every day | ORAL | Status: DC
  Administered 2016-09-26: 15 mg via ORAL
  Filled 2016-09-26: qty 1

## 2016-09-26 NOTE — Consult Note (Signed)
HCPOA/AD materials dropped off with patient.  CH available to review as needed. 

## 2016-09-26 NOTE — Progress Notes (Addendum)
[facility] [place] NAME: [name]    MR#:  [id number]  DATE OF BIRTH:  [birth year]  SUBJECTIVE:  Doing well . Some pain on the left foot. Wound vac+  REVIEW OF SYSTEMS:   Review of Systems  Constitutional: Negative for chills, fever and weight loss.  HENT: Negative for ear discharge, ear pain and nosebleeds.   Eyes: Negative for blurred vision, pain and discharge.  Respiratory: Negative for sputum production, shortness of breath, wheezing and stridor.   Cardiovascular: Negative for chest pain, palpitations, orthopnea and PND.  Gastrointestinal: Negative for abdominal pain, diarrhea, nausea and vomiting.  Genitourinary: Negative for frequency and urgency.  Musculoskeletal: Negative for back pain and joint pain.  Neurological: Negative for sensory change, speech change, focal weakness and weakness.  Psychiatric/Behavioral: Negative for depression and hallucinations. The patient is not nervous/anxious.    Tolerating Diet:yes Tolerating PT: to be initiated  DRUG ALLERGIES:   Allergies  Allergen Reactions  . Penicillins Hives, Shortness Of Breath and Swelling    Facial swelling Has patient had a PCN reaction causing immediate rash, facial/tongue/throat swelling, SOB or lightheadedness with hypotension: Yes Has patient had a PCN reaction causing severe rash involving mucus membranes or skin necrosis: Yes Has patient had a PCN reaction that required hospitalization No Has patient had a PCN reaction occurring within the last 10 years: No If all of the above answers are "NO", then may proceed with Cephalosporin use.   . Aspirin Hives  . Terramycin [Oxytetracycline] Hives    VITALS:  Blood pressure (!) 110/53, pulse (!) 59, temperature 97.6 F (36.4 C), temperature source Oral, resp. rate 16, height 5\' 5"  (1.651 m), weight 117.9 kg (260 lb), SpO2 100 %.  PHYSICAL EXAMINATION:   Physical Exam  GENERAL:  66 y.o.-year-old  patient lying in the bed with no acute distress.  EYES: Pupils equal, round, reactive to light and accommodation. No scleral icterus. Extraocular muscles intact.  HEENT: Head atraumatic, normocephalic. Oropharynx and nasopharynx clear.  NECK:  Supple, no jugular venous distention. No thyroid enlargement, no tenderness.  LUNGS: Normal breath sounds bilaterally, no wheezing, rales, rhonchi. No use of accessory muscles of respiration.  CARDIOVASCULAR: S1, S2 normal. No murmurs, rubs, or gallops.  ABDOMEN: Soft, nontender, nondistended. Bowel sounds present. No organomegaly or mass.  EXTREMITIES: No cyanosis, clubbing or edema b/l.    NEUROLOGIC: Cranial nerves II through XII are intact. No focal Motor or sensory deficits b/l.   PSYCHIATRIC:  patient is alert and oriented x 3.  SKIN: No obvious rash, lesion, or ulcer.   LABORATORY PANEL:  CBC  Recent Labs Lab 09/25/16 1058  WBC 5.6  HGB 11.7*  HCT 36.1  PLT 258    Chemistries   Recent Labs Lab 09/25/16 1058  NA 140  K 5.5*  CL 105  CO2 28  GLUCOSE 133*  BUN 16  CREATININE 1.01*  CALCIUM 8.6*   Cardiac Enzymes No results for input(s): TROPONINI in the last 168 hours. RADIOLOGY:  No results found. ASSESSMENT AND PLAN:   * Left achillis tendon infection s/p surgery POD#1 S/p debridement and vac placed. On PO abx keflex and cipro Per podiatry  * DM2 Metformin resumed SSI ADA diet Sugars well controlled  *hyperkalemia K 5.5 Recheck K today---if >5.1 give kayexalate  * Afib Continue metoprolol Pradaxa  Resumed. D/w dr troxler * HTN Cotinnue home meds  * DVT prophylaxis -on pradaxa  PT to be started.  Case discussed  with Care Management/Social Worker. Management plans discussed with the patient, family and they are in agreement.  CODE STATUS: full    TOTAL TIME TAKING CARE OF THIS PATIENT: 30 minutes.  >50% time spent on counselling and coordination of care  POSSIBLE D/C IN 1 DAYS, DEPENDING ON  CLINICAL CONDITION.  Note: This dictation was prepared with Dragon dictation along with smaller phrase technology. Any transcriptional errors that result from this process are unintentional.  Laelia Angelo M.D on 09/26/2016 at 8:33 AM  Between 7am to 6pm - Pager - [phone]  After 6pm go to [email or web address] - password [email or web address] [facility]  Office  [phone]  CC: Primary care physician; [name], DO

## 2016-09-26 NOTE — Progress Notes (Signed)
Patient Demographics  [name], is a 66 y.o. female   MRN: [id number]   DOB - October 13, 1950  Admit Date - 09/25/2016    Outpatient Primary MD for the patient is [name], DO  Consult requested in the Hospital by [name], DPM, On 09/26/2016     With History of -  Past Medical History:  Diagnosis Date  . Anxiety and depression   . Arthritis   . Asthma   . Cystocele   . Depression   . Diabetes ([place])   . DVT (deep venous thrombosis) (HCC)    Righ calf  . GERD (gastroesophageal reflux disease)   . Heart murmur    Echocardiogram June 2015: Mild MR (possible vegetation seen on TTE, not seen on TEE), normal LV size with moderate concentric LVH. Normal function EF 60-65%. Normal diastolic function. Mild LA dilation.  Marland Kitchen History of IBS   . History of methicillin resistant staphylococcus aureus (MRSA) 2008  . Hypothyroidism   . Insomnia   . Kidney stone    kidney stones with lithotripsy  . Neuropathy involving both lower extremities   . Obesity, Class II, BMI 35-39.9, with comorbidity   . Paroxysmal atrial fibrillation ([place]) 2007   In June 2015, Cardiac Event Monitor: Mostly SR/sinus arrhythmia with PVCs that are frequent. Short bursts of A. fib lasting several minutes;; CHA2DS2-VASc Score = 5 (age, Female, PVD, DM, HTN)  . Peripheral vascular disease ([place])   . Sleep apnea    use C-PAP  . Venous stasis dermatitis of both lower extremities       Past Surgical History:  Procedure Laterality Date  . ANKLE SURGERY Right   . APPLICATION OF WOUND VAC Left 06/27/2015   Procedure: APPLICATION OF WOUND VAC ( POSSIBLE ) ;  Surgeon: [name], MD;  Location: [facility];  Service: Vascular;  Laterality: Left;  . APPLICATION OF WOUND VAC Left 09/25/2016   Procedure: APPLICATION OF WOUND VAC;  Surgeon: [name], DPM;  Location: [facility];  Service: Podiatry;  Laterality: Left;  . arm surgery     right    . CHOLECYSTECTOMY    . HERNIA REPAIR     umbilical  . HIATAL HERNIA REPAIR    . I&D EXTREMITY Left 06/27/2015   Procedure: IRRIGATION AND DEBRIDEMENT EXTREMITY            ( CALF HEMATOMA ) POSSIBLE WOUND VAC;  Surgeon: [name], MD;  Location: [facility];  Service: Vascular;  Laterality: Left;  . INCISION AND DRAINAGE OF WOUND Left 09/25/2016   Procedure: IRRIGATION AND DEBRIDEMENT - PARTIAL RESECTION OF ACHILLES TENDON WITH WOUND VAC APPLICATION;  Surgeon: [name], DPM;  Location: [facility];  Service: Podiatry;  Laterality: Left;  . IRRIGATION AND DEBRIDEMENT ABSCESS Left 09/07/2016   Procedure: IRRIGATION AND DEBRIDEMENT ABSCESS LEFT HEEL;  Surgeon: [name], DPM;  Location: [facility];  Service: Podiatry;  Laterality: Left;  . JOINT REPLACEMENT     left knee replacement  . KIDNEY SURGERY Right    kidney stones  . LITHOTRIPSY    . NM [facility] HX)  February 2017   Likely breast attenuation. LOW RISK  study. Normal EF 55-60%.  . removal of left hematoma Left    leg  . REVERSE SHOULDER ARTHROPLASTY Right 10/08/2015   Procedure: REVERSE SHOULDER ARTHROPLASTY;  Surgeon: [name], MD;  Location: [facility];  Service: Orthopedics;  Laterality: Right;  . SLEEVE GASTROPLASTY    . TONSILLECTOMY    . TOTAL KNEE ARTHROPLASTY    . TRANSESOPHAGEAL ECHOCARDIOGRAM  08/08/2013   Mild LVH, EF 60-65%. Moderate LA dilation and mild RA dilation. Mild MR with no evidence of stenosis and no evidence of endocarditis. A false chordae is noted.  . TRANSTHORACIC ECHOCARDIOGRAM  08/03/2013   Mild-Moderate concentric LVH, EF 60-65%. Normal diastolic function. Mild LA dilation. Mild MR with possible vegetation  - not confirmed on TEE   . ULNAR NERVE TRANSPOSITION Right 08/06/2016   Procedure: ULNAR NERVE DECOMPRESSION/TRANSPOSITION;  Surgeon: [name], MD;  Location: [facility];  Service:  Orthopedics;  Laterality: Right;  Marland Kitchen VAGINAL HYSTERECTOMY      in for   No chief complaint on file.    HPI  [name]  is a 66 y.o. female, Underwent surgery yesterday to remove the necrotic tendon from the Achilles left heel with application of wound VAC  Social History Social History  Substance Use Topics  . Smoking status: Never Smoker  . Smokeless tobacco: Never Used  . Alcohol use No     Family History Family History  Problem Relation Age of Onset  . Skin cancer Father   . Diabetes Father   . Hypertension Father   . Peripheral vascular disease Father   . Cancer Father   . Cerebral aneurysm Father   . Alcohol abuse Father   . Varicose Veins Mother   . Kidney disease Mother   . Arthritis Mother   . Mental illness Sister   . Cancer Maternal Aunt        breast  . Breast cancer Maternal Aunt   . Arthritis Maternal Grandmother   . Hypertension Maternal Grandmother   . Diabetes Maternal Grandmother   . Arthritis Maternal Grandfather   . Heart disease Maternal Grandfather   . Hypertension Maternal Grandfather   . Arthritis Paternal Grandmother   . Hypertension Paternal Grandmother   . Diabetes Paternal Grandmother   . Arthritis Paternal Grandfather   . Hypertension Paternal Grandfather      Prior to Admission medications   Medication Sig Start Date End Date Taking? Authorizing Provider  acetaminophen (TYLENOL) 500 MG tablet Take 1,000 mg by mouth every 8 (eight) hours as needed for mild pain.   Yes [provider]  albuterol (PROVENTIL HFA;VENTOLIN HFA) 108 (90 Base) MCG/ACT inhaler Inhale 1-2 puffs into the lungs every 4 (four) hours as needed for wheezing or shortness of breath. Patient taking differently: Inhale 2 puffs into the lungs every 4 (four) hours as needed for wheezing or shortness of breath.  03/29/15  Yes Bobetta Lime, MD  albuterol (PROVENTIL) (2.5 MG/3ML) 0.083% nebulizer solution Take 3 mLs (2.5 mg total) by nebulization every 6  (six) hours as needed for wheezing or shortness of breath. 02/21/15  Yes Ashok Norris, MD  ALPRAZolam Duanne Moron) 0.5 MG tablet TAKE ONE TABLET TWICE DAILY AS NEEDED 07/24/16  Yes Cook, Jayce G, DO  buPROPion (WELLBUTRIN XL) 300 MG 24 hr tablet TAKE ONE TABLET BY MOUTH EVERY DAY 07/22/16  Yes Lacinda Axon, Jayce G, DO  calcium carbonate (OSCAL) 1500 (600 Ca) MG TABS tablet Take 600 mg of elemental calcium by mouth 2 (two) times daily with  a meal.   Yes [provider]  ciprofloxacin (CIPRO) 500 MG tablet Take 500 mg by mouth 2 (two) times daily.  09/21/16 10/01/16 Yes [provider]  cyanocobalamin 500 MCG tablet Take 500 mcg by mouth daily.   Yes [provider]  dabigatran (PRADAXA) 150 MG CAPS capsule TAKE 1 CAPSULE BY MOUTH TWICE DAILY 04/24/16  Yes Cook, Jayce G, DO  Dexlansoprazole 30 MG capsule Take 1 capsule (30 mg total) by mouth daily. 06/30/16  Yes Cook, Jayce G, DO  DULoxetine (CYMBALTA) 60 MG capsule Take 1 capsule (60 mg total) by mouth daily. 09/13/16 12/12/16 Yes King, Diona Foley, NP  ESTRACE VAGINAL 0.1 MG/GM vaginal cream INSERT 1 GRAM VAGINALLY TWICE A WEEK Patient taking differently: INSERT 1 GRAM VAGINALLY TWICE A WEEK (Tuesday and Thursday) 09/18/15  Yes Thersa Salt G, DO  ferrous sulfate 325 (65 FE) MG tablet Take 1 tablet (325 mg total) by mouth 2 (two) times daily. 07/03/16  Yes Cook, Jayce G, DO  Fluticasone-Salmeterol (ADVAIR) 250-50 MCG/DOSE AEPB Inhale 1 puff into the lungs 2 (two) times daily.   Yes [provider]  gabapentin (NEURONTIN) 800 MG tablet Take 1 tablet (800 mg total) by mouth 4 (four) times daily. 09/13/16 12/12/16 Yes Vevelyn Francois, NP  HYDROcodone-acetaminophen (NORCO/VICODIN) 5-325 MG tablet Take 1-2 tablets by mouth every 6 (six) hours as needed for severe pain. 09/09/16 10/09/16 Yes Fritzi Mandes, MD  hydrocortisone cream 1 % Apply 1 application topically daily as needed for itching.   Yes [provider]  levocetirizine (XYZAL) 5 MG  tablet Take 1 tablet (5 mg total) by mouth every evening. 07/07/16  Yes Cook, Jayce G, DO  levothyroxine (SYNTHROID, LEVOTHROID) 150 MCG tablet TAKE ONE TABLET BY MOUTH EVERY MORNING 07/22/16  Yes Cook, Jayce G, DO  meloxicam (MOBIC) 15 MG tablet Take 15 mg by mouth daily.  08/22/15  Yes [provider]  metFORMIN (GLUCOPHAGE) 1000 MG tablet TAKE ONE TABLET BY MOUTH TWICE DAILY Patient taking differently: TAKE ONE TABLET IN THE MORNING AND HALF TABLET AT NIGHT 05/21/16  Yes Crecencio Mc, MD  metoprolol tartrate (LOPRESSOR) 25 MG tablet TAKE ONE TABLET TWICE DAILY 07/01/16  Yes Cook, Jayce G, DO  mirabegron ER (MYRBETRIQ) 25 MG TB24 tablet Take 1 tablet (25 mg total) by mouth daily. 12/05/15  Yes McGowan, [name] A, PA-C  montelukast (SINGULAIR) 10 MG tablet TAKE ONE TABLET BY MOUTH EVERY DAY Patient taking differently: TAKE ONE TABLET BY MOUTH EVERY evening 04/24/16  Yes Cook, Jayce G, DO  Multiple Vitamin (MULTIVITAMIN WITH MINERALS) TABS tablet Take 1 tablet by mouth daily.   Yes [provider]  oxybutynin (DITROPAN-XL) 10 MG 24 hr tablet Take 1 tablet (10 mg total) by mouth daily. Patient taking differently: Take 10 mg by mouth at bedtime.  12/05/15  Yes McGowan, [name] A, PA-C  quinapril (ACCUPRIL) 20 MG tablet TAKE ONE TABLET EVERY DAY 05/26/16  Yes Cook, Jayce G, DO  oxyCODONE-acetaminophen (PERCOCET) 7.5-325 MG tablet Take 1 tablet by mouth every 4 (four) hours as needed for severe pain. 09/25/16   [name], DPM    Anti-infectives    Start     Dose/Rate Route Frequency Ordered Stop   09/25/16 1400  cephALEXin (KEFLEX) capsule 500 mg     500 mg Oral Every 8 hours 09/25/16 0953     09/25/16 1000  ciprofloxacin (CIPRO) tablet 500 mg     500 mg Oral 2 times daily 09/25/16 0953  09/25/16 0716  piperacillin-tazobactam (ZOSYN) 3.375 GM/50ML IVPB    Comments:  Rexanne Mano   : cabinet override      09/25/16 0716 09/25/16 1929      Scheduled Meds: . buPROPion  300 mg  Oral Daily  . calcium carbonate  600 mg of elemental calcium Oral BID WC  . cephALEXin  500 mg Oral Q8H  . ciprofloxacin  500 mg Oral BID  . cyanocobalamin  500 mcg Oral Daily  . dabigatran  150 mg Oral BID  . DULoxetine  60 mg Oral Daily  . ferrous sulfate  325 mg Oral BID  . gabapentin  800 mg Oral QID  . insulin aspart  0-9 Units Subcutaneous TID WC  . levothyroxine  150 mcg Oral q morning - 10a  . loratadine  10 mg Oral QPM  . meloxicam  15 mg Oral Daily  . metFORMIN  500 mg Oral BID WC  . metoprolol tartrate  25 mg Oral BID  . mirabegron ER  25 mg Oral Daily  . mometasone-formoterol  2 puff Inhalation BID  . montelukast  10 mg Oral QPM  . multivitamin with minerals  1 tablet Oral Daily  . oxybutynin  10 mg Oral QHS  . pantoprazole  40 mg Oral Daily  . quinapril  20 mg Oral Daily  . sodium polystyrene  30 g Oral Once   Continuous Infusions: PRN Meds:.albuterol, ALPRAZolam, morphine injection, oxyCODONE-acetaminophen  Allergies  Allergen Reactions  . Penicillins Hives, Shortness Of Breath and Swelling    Facial swelling Has patient had a PCN reaction causing immediate rash, facial/tongue/throat swelling, SOB or lightheadedness with hypotension: Yes Has patient had a PCN reaction causing severe rash involving mucus membranes or skin necrosis: Yes Has patient had a PCN reaction that required hospitalization No Has patient had a PCN reaction occurring within the last 10 years: No If all of the above answers are "NO", then may proceed with Cephalosporin use.   . Aspirin Hives  . Terramycin [Oxytetracycline] Hives    Physical Exam  Vitals  Blood pressure (!) 110/53, pulse (!) 59, temperature 97.6 F (36.4 C), temperature source Oral, resp. rate 16, height 5\' 5"  (1.651 m), weight 117.9 kg (260 lb), SpO2 100 %.  Lower Extremity exam:  Vascular:DP and PT pulses are +2 over 4 bilateral  Dermatological: Large wound left heel Achilles tendon has been resected due to  necrosis and devitalization and prohibition of wound healing to the left Achilles region  Neurological: Still sensate patient has some pain to the posterior left Achilles region  Ortho: Necrotic tendon exposed and large wounds causing lack of progress with wound healing and continued necrosis to the tendon.  Data Review  CBC  Recent Labs Lab 09/25/16 1058  WBC 5.6  HGB 11.7*  HCT 36.1  PLT 258  MCV 84.6  MCH 27.5  MCHC 32.5  RDW 17.4*  LYMPHSABS 1.0  MONOABS 0.5  EOSABS 0.3  BASOSABS 0.1   ------------------------------------------------------------------------------------------------------------------  Chemistries   Recent Labs Lab 09/25/16 1058 09/26/16 0829  NA 140  --   K 5.5* 5.4*  CL 105  --   CO2 28  --   GLUCOSE 133*  --   BUN 16  --   CREATININE 1.01*  --   CALCIUM 8.6*  --    ------------------------------------------------------------------------------------Assessment & Plan: We have physical therapy to work with her on transferring today. She'll be undergone what she can transfer using a walker partial weightbearing on that left foot.  I will make sure the wound VAC is changed every 3 days. She will need home health to accomplish this. She is to return in to my office about a week from Monday or Tuesday.  Active Problems:   Cellulitis and abscess of foot     Family Communication: Plan discussed with patient   Perry Mount M.D on 09/26/2016 at 10:11 AM  Thank you for the consult, we will follow the patient with you in the Hospital.

## 2016-09-26 NOTE — Progress Notes (Signed)
Physical Therapy Evaluation Patient Details Name: [name] MRN: [id number] DOB: 26-Jul-1950 Today's Date: 09/26/2016   History of Present Illness  Patient is a 66 y.o. female admitted 96-30 JUN with L LE cellulitis. Returned 06 JUL for resection of necrotic Achilles tendon with debridement of wound excisional and application of wound VAC. PMH includes PVD secondary to DM, hypothyroidism, essential HTN, chronic pain, anxiety, and depression.  Clinical Impression  Patient admitted for above listed reasons. On evaluation, patient demonstrates need for CGA to perform bed mobility. Required verbal cues for sequencing with sit to stand transfer, initially demonstrating posterior lean and returning to sitting. Upon second attempt, patient better able to weightshift and maintain precautions with minimal assistance. Required increased time and verbal cues with CGA to transfer to bedside chair. Because patient is not at baseline level of function and is significantly limited in ambulation, it is believed that she will benefit from skilled and progressive PT to improve gait, muscle strength/endurance, and function. Patient will benefit from f/u HHPT with adaptive equipment upon d/c when medically ready.    Follow Up Recommendations Home health PT    Equipment Recommendations  Wheelchair (measurements PT);Other (comment) (Tub bench)    Recommendations for Other Services       Precautions / Restrictions Precautions Precautions: Fall Required Braces or Orthoses: Other Brace/Splint Other Brace/Splint: Post-op shoe Restrictions Weight Bearing Restrictions: Yes LLE Weight Bearing: Partial weight bearing Other Position/Activity Restrictions: Transfers only      Mobility  Bed Mobility Overal bed mobility: Needs Assistance Bed Mobility: Supine to Sit     Supine to sit: Min guard;HOB elevated     General bed mobility comments: Patient utilized bed rail to move from supine to sit. Able to  weightshift independently.  Transfers Overall transfer level: Needs assistance Equipment used: Rolling walker (2 wheeled) Transfers: Sit to/from [facility] Sit to Stand: Min assist Stand pivot transfers: Min guard       General transfer comment: Patient initially demonstrated posterior lean upon sit to stand transfer. Upon second attempt, patient better able to weightshift and maintain WB precautions. Required CGA to move from bed to chair with verbal cues for sequencing to maintain WB precautions.  Ambulation/Gait             General Gait Details: Deferred   Stairs            Wheelchair Mobility    Modified Rankin (Stroke Patients Only)       Balance Overall balance assessment: Needs assistance Sitting-balance support: No upper extremity supported Sitting balance-Leahy Scale: Good     Standing balance support: Bilateral upper extremity supported Standing balance-Leahy Scale: Fair                               Pertinent Vitals/Pain Pain Assessment: Faces Faces Pain Scale: Hurts little more Pain Location: L foot Pain Descriptors / Indicators: Aching Pain Intervention(s): Limited activity within patient's tolerance;Monitored during session    [name] expects to be discharged to:: Private residence Living Arrangements: Spouse/significant other Available Help at Discharge: Family;Available 24 hours/day Type of Home: House Home Access: Stairs to enter Entrance Stairs-Rails: Can reach both Entrance Stairs-Number of Steps: 4 Home Layout: One level Home Equipment: Walker - 2 wheels;Bedside commode;Shower seat      Prior Function Level of Independence: Needs assistance   Gait / Transfers Assistance Needed: Performed for household distances with AD  ADL's / Homemaking  Assistance Needed: Performed by husband, Christa See Dominance        Extremity/Trunk Assessment   Upper Extremity  Assessment Upper Extremity Assessment: Generalized weakness;Overall [facility] for tasks assessed    Lower Extremity Assessment Lower Extremity Assessment: Generalized weakness;Overall WFL for tasks assessed;LLE deficits/detail LLE Deficits / Details: Not assessed due to immobilization; capillary refill intact LLE: Unable to fully assess due to immobilization       Communication   Communication: No difficulties  Cognition Arousal/Alertness: Awake/alert Behavior During Therapy: WFL for tasks assessed/performed Overall Cognitive Status: Within Functional Limits for tasks assessed                                        General Comments      Exercises     Assessment/Plan    PT Assessment Patient needs continued PT services  PT Problem List Decreased strength;Decreased range of motion;Decreased activity tolerance;Decreased balance;Decreased mobility;Decreased knowledge of use of DME;Decreased safety awareness;Pain;Decreased skin integrity;Decreased knowledge of precautions       PT Treatment Interventions Gait training;DME instruction;Stair training;Functional mobility training;Therapeutic activities;Therapeutic exercise;Balance training;Patient/family education;Wheelchair mobility training    PT Goals (Current goals can be found in the Care Plan section)  Acute Rehab PT Goals Patient Stated Goal: To return to PLOF PT Goal Formulation: With patient Time For Goal Achievement: 10/10/16 Potential to Achieve Goals: Good    Frequency 7X/week   Barriers to discharge        Co-evaluation               AM-PAC PT "6 Clicks" Daily Activity  Outcome Measure Difficulty turning over in bed (including adjusting bedclothes, sheets and blankets)?: None Difficulty moving from lying on back to sitting on the side of the bed? : A Little Difficulty sitting down on and standing up from a chair with arms (e.g., wheelchair, bedside commode, etc,.)?: A Little Help needed  moving to and from a bed to chair (including a wheelchair)?: A Little Help needed walking in hospital room?: Total Help needed climbing 3-5 steps with a railing? : Total 6 Click Score: 15    End of Session Equipment Utilized During Treatment: Gait belt Activity Tolerance: Patient limited by fatigue Patient left: in chair;with call bell/phone within reach;with chair alarm set Nurse Communication: Mobility status PT Visit Diagnosis: Unsteadiness on feet (R26.81);Muscle weakness (generalized) (M62.81);Difficulty in walking, not elsewhere classified (R26.2);Pain Pain - Right/Left: Left Pain - part of body: Ankle and joints of foot    Time: 1009-1040 PT Time Calculation (min) (ACUTE ONLY): 31 min   Charges:   PT Evaluation $PT Eval Low Complexity: 1 Procedure     PT G Codes:   PT G-Codes **NOT FOR INPATIENT CLASS** Functional Assessment Tool Used: AM-PAC 6 Clicks Basic Mobility;Clinical judgement Functional Limitation: Mobility: Walking and moving around Mobility: Walking and Moving Around Current Status (J6283): At least 40 percent but less than 60 percent impaired, limited or restricted Mobility: Walking and Moving Around Goal Status [phone]): At least 20 percent but less than 40 percent impaired, limited or restricted      Dorice Lamas, PT, DPT 09/26/2016, 10:52 AM

## 2016-09-26 NOTE — Care Management Note (Signed)
Case Management Note  Patient Details  Name: VIRGILENE STRYKER MRN: [id number] Date of Birth: Mar 08, 1951  Subjective/Objective:       Call to Melene Muller at [facility]  Requesting a [facility] to be delivered to Ms [place] room 147 today. Call to [place] at [facility] requesting HH-PT, RN. Updated Kristy per orders to change wound vac every 3 days.            Action/Plan:   Expected Discharge Date:     09/26/16            Expected Discharge Plan:  [place]  09/26/16  In-House Referral:     Discharge planning Services  CM Consult  Post Acute Care Choice:  Home Health Choice offered to:  Patient  DME Arranged:    DME Agency:     HH Arranged:  PT, RN [place] Agency:  [place]  [facility]  Status of Service:  In process, will continue to follow  Completed  If discussed at Long Length of Stay Meetings, dates discussed:    Additional Comments:  Corvin Sorbo A, RN 09/26/2016, 10:33 AM

## 2016-09-26 NOTE — Progress Notes (Signed)
Pts potasium 5.4 MD Patel notified. Orders received to give the Kayexalate and recheck potasium at 1400. Order placed.

## 2016-09-26 NOTE — Discharge Instructions (Signed)
1.  Wound vac needs to be changed every 3 days. 2. Patient is to use a walker when ambulating and mostly just transferred to bedside chair or toilet. She did have postop shoe on foot when putting weight on it. Recommend partial weightbearing on the left foot. 3. Return to my office a week from Monday for evaluation. 4. Do not take the hydrocodone while using the oxycodone. If you need to take oxycodone for your immediate postop pain, avoid utilizing the hydrocodone during that time frame. 5. Call our office phone number at [phone] any problems or questions develop.

## 2016-09-26 NOTE — Progress Notes (Signed)
Pt questioning PO abx order, stating she has enough for tomm, and that her pharmacy is closed.  MD Troxler notified. Per MD have pt take abx tomm, and Monday she is to call the doctors office and they will call her order into the pharmacy. Pt notified and verbalized understanding.

## 2016-09-26 NOTE — Care Management Note (Signed)
Case Management Note  Patient Details  Name: [name] MRN: [id number] Date of Birth: Dec 21, 1950  Subjective/Objective:      [facility]-PT has recommended a home wheelchair. Dr Fritzi Mandes was updated and is putting in an order for a wheelchair with adjustable legs. Melene Muller at [facility] was updated and will deliver a wheelchair to [name] [facility] room 147.               Action/Plan:   Expected Discharge Date:  09/26/16               Expected Discharge Plan:  [place]  In-House Referral:     Discharge planning Services  CM Consult  Post Acute Care Choice:  Home Health Choice offered to:  Patient  DME Arranged:   Wheelchair DME Agency:   [facility]  HH Arranged:  PT, RN [place] Agency:  [facility]  Status of Service:  In process, will continue to follow  Completed  If discussed at Long Length of Stay Meetings, dates discussed:    Additional Comments:  Isiac Breighner A, RN 09/26/2016, 4:03 PM

## 2016-09-26 NOTE — Discharge Summary (Signed)
Physician Discharge Summary  Patient ID: [name] MRN: [id number] DOB/AGE: [birth year] 66 y.o.  Admit date: 09/25/2016 Discharge date: 09/26/2016  Admission Diagnoses:Dehisced diabetic wound left heel with exposed necrotic Achilles tendon secondary to cellulitis and abscess of the left foot  Discharge Diagnoses:  Active Problems:   Cellulitis and abscess of foot   Discharged Condition: good  Hospital Course: The patient came in yesterday morning for surgery underwent surgery on the left Achilles area to remove the necrotic tendon and remove and debride all infected skin soft tissue tendon and muscle from the region. Wound VAC was placed on the area following surgery. She tolerated the rotated the procedures and anesthesia well. She is resting comfortably in her bed this morning. Still having some pain with the heel but is controlled by medicines.  Consults: Internal medicine was consulted to manage her diabetes and internal medicine these during her hospital stay Significant Diagnostic Studies: labs:   Treatments: surgery: Physical therapy also for gait training and transfer with partial weightbearing the left foot  Discharge Exam: Blood pressure (!) 110/53, pulse (!) 59, temperature 97.6 F (36.4 C), temperature source Oral, resp. rate 16, height 5\' 5"  (1.651 m), weight 117.9 kg (260 lb), SpO2 100 %. Incision/Wound: Wound VAC is intact to the left heel she is stable from overall standpoint just needs to get up and transfer to a bedside toilet and she should be out of be discharged.  Disposition: 06-Home-Health Care Svc     Signed: Jaqueline Uber G 09/26/2016, 10:00 AM

## 2016-09-26 NOTE — Progress Notes (Signed)
Pts potasium 5.1. MD Posey Pronto notified Per  MD pt  is ok to discharge. Orders received to discharge patient.

## 2016-09-28 DIAGNOSIS — E1151 Type 2 diabetes mellitus with diabetic peripheral angiopathy without gangrene: Secondary | ICD-10-CM | POA: Diagnosis not present

## 2016-09-28 DIAGNOSIS — Z7901 Long term (current) use of anticoagulants: Secondary | ICD-10-CM | POA: Diagnosis not present

## 2016-09-28 DIAGNOSIS — Z6841 Body Mass Index (BMI) 40.0 and over, adult: Secondary | ICD-10-CM | POA: Diagnosis not present

## 2016-09-28 DIAGNOSIS — B9561 Methicillin susceptible Staphylococcus aureus infection as the cause of diseases classified elsewhere: Secondary | ICD-10-CM | POA: Diagnosis not present

## 2016-09-28 DIAGNOSIS — I480 Paroxysmal atrial fibrillation: Secondary | ICD-10-CM | POA: Diagnosis not present

## 2016-09-28 DIAGNOSIS — E1142 Type 2 diabetes mellitus with diabetic polyneuropathy: Secondary | ICD-10-CM | POA: Diagnosis not present

## 2016-09-28 DIAGNOSIS — E11621 Type 2 diabetes mellitus with foot ulcer: Secondary | ICD-10-CM | POA: Diagnosis not present

## 2016-09-28 DIAGNOSIS — Z794 Long term (current) use of insulin: Secondary | ICD-10-CM | POA: Diagnosis not present

## 2016-09-28 DIAGNOSIS — Z9981 Dependence on supplemental oxygen: Secondary | ICD-10-CM | POA: Diagnosis not present

## 2016-09-28 DIAGNOSIS — J45909 Unspecified asthma, uncomplicated: Secondary | ICD-10-CM | POA: Diagnosis not present

## 2016-09-28 DIAGNOSIS — Z8614 Personal history of Methicillin resistant Staphylococcus aureus infection: Secondary | ICD-10-CM | POA: Diagnosis not present

## 2016-09-28 DIAGNOSIS — F418 Other specified anxiety disorders: Secondary | ICD-10-CM | POA: Diagnosis not present

## 2016-09-28 DIAGNOSIS — L03116 Cellulitis of left lower limb: Secondary | ICD-10-CM | POA: Diagnosis not present

## 2016-09-28 DIAGNOSIS — L97423 Non-pressure chronic ulcer of left heel and midfoot with necrosis of muscle: Secondary | ICD-10-CM | POA: Diagnosis not present

## 2016-09-28 DIAGNOSIS — I10 Essential (primary) hypertension: Secondary | ICD-10-CM | POA: Diagnosis not present

## 2016-09-28 LAB — SURGICAL PATHOLOGY

## 2016-09-29 ENCOUNTER — Other Ambulatory Visit: Payer: Self-pay | Admitting: Family Medicine

## 2016-09-29 ENCOUNTER — Telehealth: Payer: Self-pay | Admitting: Family Medicine

## 2016-09-29 DIAGNOSIS — F419 Anxiety disorder, unspecified: Secondary | ICD-10-CM

## 2016-09-29 DIAGNOSIS — F329 Major depressive disorder, single episode, unspecified: Secondary | ICD-10-CM

## 2016-09-29 NOTE — Telephone Encounter (Signed)
Filled last on 07/24/16 #60 with no refills.  Last OV was 09/21/16. Please advise, thanks

## 2016-09-29 NOTE — Telephone Encounter (Signed)
Pt spouse called and stated that he is concerned about wife. Spouse stated that the last few surgeries they have noticed that her breathing has been labored. Pt just got out of surgery a few days ago and they just took her o2 sat and it was 74. The family also states that the patient is disoriented and saying off the wall things. Spouse did call Dr. Selina Cooley office and they basically told them to call our office about this. Pt's family is very concerned. Dr. troxler's office called back while we were on the phone, Dr. troxler is now suggesting that they go to the ED. [name] (spouse) would like Dr. Jonathon Jordan opinion now. Please advise, thank you!  Call [facility] @ [phone]

## 2016-09-29 NOTE — Telephone Encounter (Signed)
I spoke with the husband.  Patient is currently on 2L [place] at home.  When she wears her O2 she seems to be okay. He says that at times she seems to be having what he would like to call "Mini strokes" she seems to phase in and out, sometimes her eyes roll and she losses focus.  She needs increase assistance with ADLs at home, he has been helping with bathing and at times she acts as though she is going to just fall back opposite of what they are doing.  He checked her O2 with the oxygen and it was 96-97% and then without it, it was in the mid 70% range.  He said he has rule out the possibility of over medication as the pain medications had a total of 30 pills and there are 29 in the bottle. He said that he is concerned that even small damage will lead to long term effects with his wife.  I agreed and he agreed to take her to the ED, he had to pick up a 1 year old granddaughter and then they would be on there way.  He will follow up with you after they are seen. thanks

## 2016-09-29 NOTE — Telephone Encounter (Signed)
Please advise, thanks.

## 2016-09-29 NOTE — Telephone Encounter (Signed)
Agree. Go directly to ER immediately. Call 911.

## 2016-09-30 ENCOUNTER — Emergency Department
Admission: EM | Admit: 2016-09-30 | Discharge: 2016-09-30 | Disposition: A | Discharge status: Home / Self Care | Payer: PPO | Attending: Emergency Medicine | Admitting: Emergency Medicine

## 2016-09-30 ENCOUNTER — Encounter: Payer: Self-pay | Admitting: Emergency Medicine

## 2016-09-30 ENCOUNTER — Emergency Department: Payer: PPO

## 2016-09-30 DIAGNOSIS — E039 Hypothyroidism, unspecified: Secondary | ICD-10-CM | POA: Diagnosis not present

## 2016-09-30 DIAGNOSIS — J45909 Unspecified asthma, uncomplicated: Secondary | ICD-10-CM | POA: Insufficient documentation

## 2016-09-30 DIAGNOSIS — E119 Type 2 diabetes mellitus without complications: Secondary | ICD-10-CM | POA: Diagnosis not present

## 2016-09-30 DIAGNOSIS — R0602 Shortness of breath: Secondary | ICD-10-CM | POA: Diagnosis not present

## 2016-09-30 DIAGNOSIS — R002 Palpitations: Secondary | ICD-10-CM

## 2016-09-30 DIAGNOSIS — G8929 Other chronic pain: Secondary | ICD-10-CM | POA: Insufficient documentation

## 2016-09-30 DIAGNOSIS — M7989 Other specified soft tissue disorders: Secondary | ICD-10-CM | POA: Diagnosis not present

## 2016-09-30 DIAGNOSIS — L97322 Non-pressure chronic ulcer of left ankle with fat layer exposed: Secondary | ICD-10-CM | POA: Diagnosis not present

## 2016-09-30 LAB — CBC
HEMATOCRIT: 34.6 % — AB (ref 35.0–47.0)
Hemoglobin: 11.8 g/dL — ABNORMAL LOW (ref 12.0–16.0)
MCH: 28.4 pg (ref 26.0–34.0)
MCHC: 33.9 g/dL (ref 32.0–36.0)
MCV: 83.6 fL (ref 80.0–100.0)
Platelets: 261 10*3/uL (ref 150–440)
RBC: 4.14 MIL/uL (ref 3.80–5.20)
RDW: 17.1 % — AB (ref 11.5–14.5)
WBC: 5.8 10*3/uL (ref 3.6–11.0)

## 2016-09-30 LAB — BASIC METABOLIC PANEL
ANION GAP: 7 (ref 5–15)
BUN: 16 mg/dL (ref 6–20)
CALCIUM: 9.3 mg/dL (ref 8.9–10.3)
CO2: 30 mmol/L (ref 22–32)
Chloride: 101 mmol/L (ref 101–111)
Creatinine, Ser: 0.97 mg/dL (ref 0.44–1.00)
GFR calc Af Amer: >60 mL/min (ref >60)
GFR calc non Af Amer: 59 mL/min — ABNORMAL LOW (ref >60)
GLUCOSE: 113 mg/dL — AB (ref 65–99)
Potassium: 5.1 mmol/L (ref 3.5–5.1)
Sodium: 138 mmol/L (ref 135–145)

## 2016-09-30 LAB — BRAIN NATRIURETIC PEPTIDE: B Natriuretic Peptide: 216 pg/mL — ABNORMAL HIGH (ref 0.0–100.0)

## 2016-09-30 LAB — TROPONIN I: Troponin I: <0.03 ng/mL (ref <0.03)

## 2016-09-30 MED ORDER — IOPAMIDOL (ISOVUE-370) INJECTION 76%
75.0000 mL | Freq: Once | INTRAVENOUS | Status: AC | PRN
  Administered 2016-09-30: 75 mL via INTRAVENOUS

## 2016-09-30 NOTE — ED Notes (Signed)
Pt returned from CT °

## 2016-09-30 NOTE — ED Triage Notes (Signed)
Arrives from PCP, Dr. Silvestre Gunner office to r/o blood clot.  Patient has recent foot surgeries and husband states he has been checking her oxygen saturations, which have been running from 70's-90's.  Patient denies any complaints today.

## 2016-09-30 NOTE — ED Provider Notes (Signed)
[facility] Emergency Department Provider Note  ____________________________________________  Time seen: Approximately 7:51 PM  I have reviewed the triage vital signs and the nursing notes.   HISTORY  Chief Complaint sent by pcp    HPI CHARRON COULTAS is a 66 y.o. female with recent incision and drainage of the left Achilles for abscess presenting with shortness of breath and hypoxia. The patient reports that she was admitted last week for surgery 2 and currently has a wound VAC over the left Achilles. Since arriving home, she has been checking her oxygen saturations at home with a pulse oximeter that she got from a friend, and has had readings as low as 79% on room air. She does have a history of sleep apnea and uses oxygen as needed. She has not been symptomatic during the times that she has checked her oxygen level, but does report occasional shortness of breath. Over the past several months, she also has 3-4 episodes weekly of a heart racing sensation that any chest pain or tightness which resolved spontaneously after several minutes. She is unsure whether her shortness of breath episodes are related to her palpitations.  She has had some swelling in the LLE on the side of her surgeries.  Past Medical History:  Diagnosis Date  . Anxiety and depression   . Arthritis   . Asthma   . Cystocele   . Depression   . Diabetes ([place])   . DVT (deep venous thrombosis) (HCC)    Righ calf  . GERD (gastroesophageal reflux disease)   . Heart murmur    Echocardiogram June 2015: Mild MR (possible vegetation seen on TTE, not seen on TEE), normal LV size with moderate concentric LVH. Normal function EF 60-65%. Normal diastolic function. Mild LA dilation.  Marland Kitchen History of IBS   . History of methicillin resistant staphylococcus aureus (MRSA) 2008  . Hypothyroidism   . Insomnia   . Kidney stone    kidney stones with lithotripsy  . Neuropathy involving both lower extremities    . Obesity, Class II, BMI 35-39.9, with comorbidity   . Paroxysmal atrial fibrillation ([place]) 2007   In June 2015, Cardiac Event Monitor: Mostly SR/sinus arrhythmia with PVCs that are frequent. Short bursts of A. fib lasting several minutes;; CHA2DS2-VASc Score = 5 (age, Female, PVD, DM, HTN)  . Peripheral vascular disease ([place])   . Sleep apnea    use C-PAP  . Venous stasis dermatitis of both lower extremities     Patient Active Problem List   Diagnosis Date Noted  . Cellulitis and abscess of foot 09/25/2016  . Abscess of tendon of left foot 09/21/2016  . Chronic pain syndrome 02/25/2016  . Long term current use of opiate analgesic 02/25/2016  . Chronic right shoulder pain 02/25/2016  . DOE (dyspnea on exertion) 11/06/2015  . Status post reverse total shoulder replacement 10/08/2015  . 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 Morbid obesity with BMI of 40.0-44.9, adult ([place]) 09/23/2015  . Chronic prescription benzodiazepine use 09/23/2015  . Osteoarthritis, multiple sites 09/23/2015  . Other shoulder lesions (Right) 08/28/2015  . Opiate use (15 MME/Day) 01/16/2015  . Mixed incontinence 11/26/2014  . Atrophic vaginitis 11/26/2014  . Anxiety and depression 09/21/2014  . Bladder cystocele 09/21/2014  . Gastro-esophageal reflux disease without esophagitis 09/21/2014  . DM (diabetes mellitus) type II controlled peripheral vascular disorder ([place]) 08/31/2014  . Diabetic peripheral neuropathy associated with type 2 diabetes mellitus ([place]) 08/31/2014  . Asthma, moderate  persistent, well-controlled 08/31/2014  . Hypothyroidism, adult 08/31/2014  . Peripheral vascular disease due to secondary diabetes mellitus ([place]) 12/11/2013  . Apnea, sleep 10/19/2013  . Paroxysmal atrial fibrillation ([place]); CHA2DS2-Vasc Score 5. On Pradaxa 07/27/2013  . Essential hypertension 07/27/2013    Past Surgical History:  Procedure Laterality Date  . ANKLE SURGERY Right   .  APPLICATION OF WOUND VAC Left 06/27/2015   Procedure: APPLICATION OF WOUND VAC ( POSSIBLE ) ;  Surgeon: [name], MD;  Location: [facility] ORS;  Service: Vascular;  Laterality: Left;  . APPLICATION OF WOUND VAC Left 09/25/2016   Procedure: APPLICATION OF WOUND VAC;  Surgeon: [name], DPM;  Location: [facility] ORS;  Service: Podiatry;  Laterality: Left;  . arm surgery     right    . CHOLECYSTECTOMY    . HERNIA REPAIR     umbilical  . HIATAL HERNIA REPAIR    . I&D EXTREMITY Left 06/27/2015   Procedure: IRRIGATION AND DEBRIDEMENT EXTREMITY            ( CALF HEMATOMA ) POSSIBLE WOUND VAC;  Surgeon: [name], MD;  Location: [facility] ORS;  Service: Vascular;  Laterality: Left;  . INCISION AND DRAINAGE OF WOUND Left 09/25/2016   Procedure: IRRIGATION AND DEBRIDEMENT - PARTIAL RESECTION OF ACHILLES TENDON WITH WOUND VAC APPLICATION;  Surgeon: [name], DPM;  Location: [facility] ORS;  Service: Podiatry;  Laterality: Left;  . IRRIGATION AND DEBRIDEMENT ABSCESS Left 09/07/2016   Procedure: IRRIGATION AND DEBRIDEMENT ABSCESS LEFT HEEL;  Surgeon: [name], DPM;  Location: [facility] ORS;  Service: Podiatry;  Laterality: Left;  . JOINT REPLACEMENT     left knee replacement  . KIDNEY SURGERY Right    kidney stones  . LITHOTRIPSY    . NM [facility] HX)  February 2017   Likely breast attenuation. LOW RISK study. Normal EF 55-60%.  . removal of left hematoma Left    leg  . REVERSE SHOULDER ARTHROPLASTY Right 10/08/2015   Procedure: REVERSE SHOULDER ARTHROPLASTY;  Surgeon: [name], MD;  Location: [facility] ORS;  Service: Orthopedics;  Laterality: Right;  . SLEEVE GASTROPLASTY    . TONSILLECTOMY    . TOTAL KNEE ARTHROPLASTY    . TRANSESOPHAGEAL ECHOCARDIOGRAM  08/08/2013   Mild LVH, EF 60-65%. Moderate LA dilation and mild RA dilation. Mild MR with no evidence of stenosis and no evidence of endocarditis. A false chordae is noted.  . TRANSTHORACIC ECHOCARDIOGRAM  08/03/2013   Mild-Moderate  concentric LVH, EF 60-65%. Normal diastolic function. Mild LA dilation. Mild MR with possible vegetation  - not confirmed on TEE   . ULNAR NERVE TRANSPOSITION Right 08/06/2016   Procedure: ULNAR NERVE DECOMPRESSION/TRANSPOSITION;  Surgeon: [name], MD;  Location: [facility] ORS;  Service: Orthopedics;  Laterality: Right;  Marland Kitchen VAGINAL HYSTERECTOMY      Current Outpatient Rx  . Order #: [id number] Class: Historical Med  . Order #: [id number] Class: Normal  . Order #: [id number] Class: Normal  . Order #: [id number] Class: Print  . Order #: [id number] Class: Normal  . Order #: [id number] Class: Historical Med  . Order #: [id number] Class: Historical Med  . Order #: [id number] Class: Historical Med  . Order #: [id number] Class: Historical Med  . Order #: [id number] Class: Normal  . Order #: [id number] Class: Normal  . Order #: [id number] Class: Normal  . Order #: [id number] Class: Normal  . Order #: [id number] Class: Normal  . Order #: [id number] Class: Historical Med  . Order #: [id number] Class: Normal  . Order #: [id number] Class: Normal  . Order #:  [id number] Class: Historical Med  . Order #: [id number] Class: Normal  . Order #: [id number] Class: Normal  . Order #: [id number] Class: Normal  . Order #: [id number] Class: Normal  . Order #: [id number] Class: Historical Med  . Order #: [id number] Class: Normal  . Order #: [id number] Class: Print  . Order #: [id number] Class: Normal  . Order #: [id number] Class: Normal  . Order #: [id number] Class: Historical Med    Allergies Penicillins; Aspirin; and Terramycin [oxytetracycline]  Family History  Problem Relation Age of Onset  . Skin cancer Father   . Diabetes Father   . Hypertension Father   . Peripheral vascular disease Father   . Cancer Father   . Cerebral aneurysm Father   . Alcohol abuse Father   . Varicose Veins Mother   . Kidney disease Mother   . Arthritis Mother   . Mental illness Sister   . Cancer Maternal Aunt        breast  . Breast cancer Maternal Aunt   .  Arthritis Maternal Grandmother   . Hypertension Maternal Grandmother   . Diabetes Maternal Grandmother   . Arthritis Maternal Grandfather   . Heart disease Maternal Grandfather   . Hypertension Maternal Grandfather   . Arthritis Paternal Grandmother   . Hypertension Paternal Grandmother   . Diabetes Paternal Grandmother   . Arthritis Paternal Grandfather   . Hypertension Paternal Grandfather     Social History Social History  Substance Use Topics  . Smoking status: Never Smoker  . Smokeless tobacco: Never Used  . Alcohol use No    Review of Systems Constitutional: No fever/chills. No lightheadedness or syncope. Eyes: No visual changes. ENT: No sore throat. No congestion or rhinorrhea. Cardiovascular: Denies chest pain. Positive palpitations. Respiratory: Positive shortness of breath.  No cough. Gastrointestinal: No abdominal pain.  No nausea, no vomiting.  No diarrhea.  No constipation. Genitourinary: Negative for dysuria. Musculoskeletal: Negative for back pain. Positive left Achilles abscess status post surgery. Skin: Negative for rash. Neurological: Negative for headaches. No focal numbness, tingling or weakness.     ____________________________________________   PHYSICAL EXAM:  VITAL SIGNS: ED Triage Vitals  Enc Vitals Group     BP 09/30/16 1848 (!) 145/68     Pulse Rate 09/30/16 1848 92     Resp 09/30/16 1848 18     Temp 09/30/16 1848 97.8 F (36.6 C)     Temp Source 09/30/16 1848 Oral     SpO2 09/30/16 1848 100 %     Weight 09/30/16 1849 260 lb (117.9 kg)     Height 09/30/16 1849 5\' 5"  (1.651 m)     Head Circumference --      Peak Flow --      Pain Score --      Pain Loc --      Pain Edu? --      Excl. in [place]? --     Constitutional: Alert and oriented. Chronically ill appearing and in no acute distress. Answers questions appropriately. Eyes: Conjunctivae are normal.  EOMI. No scleral icterus. Head: Atraumatic. Nose: No  congestion/rhinnorhea. Mouth/Throat: Mucous membranes are moist.  Neck: No stridor.  Supple.  No JVD. No meningismus. Cardiovascular: Normal rate, regular rhythm. No murmurs, rubs or gallops.  Respiratory: Normal respiratory effort.  No accessory muscle use or retractions. Lungs CTAB.  No wheezes, rales or ronchi. Gastrointestinal: Morbidly obese. Soft, nontender and nondistended.  No guarding or rebound.  No peritoneal signs. Musculoskeletal: The patient has a wound VAC on the left ankle without any significant  drainage. She has some erythema and purple discoloration that is consistent with peripheral vascular disease from the mid tibia downwards on both legs. She is able to move her toes bilaterally and has normal DP and PT pulse on the left leg. She is able to move her legs without any significant discomfort. Neurologic:  A&Ox3.  Speech is clear.  Face and smile are symmetric.  EOMI.  Moves all extremities well. Skin:  Skin is warm, dry and intact. No rash noted. Psychiatric: Mood and affect are normal.  ____________________________________________   LABS (all labs ordered are listed, but only abnormal results are displayed)  Labs Reviewed  CBC - Abnormal; Notable for the following:       Result Value   Hemoglobin 11.8 (*)    HCT 34.6 (*)    RDW 17.1 (*)    All other components within normal limits  BASIC METABOLIC PANEL - Abnormal; Notable for the following:    Glucose, Bld 113 (*)    GFR calc non Af Amer 59 (*)    All other components within normal limits  BRAIN NATRIURETIC PEPTIDE - Abnormal; Notable for the following:    B Natriuretic Peptide 216.0 (*)    All other components within normal limits  TROPONIN I  URINALYSIS, COMPLETE (UACMP) WITH MICROSCOPIC   ____________________________________________  EKG  ED ECG REPORT I, Eula Listen, the attending physician, personally viewed and interpreted this ECG.   Date: 09/30/2016  EKG Time: 2118  Rate: 56  Rhythm:  sinus bradycardia  Axis: normal  Intervals:first-degree A-V block   ST&T Change: No STEMI  ____________________________________________  RADIOLOGY  Ct Angio Chest Pe W And/or Wo Contrast  Result Date: 09/30/2016 CLINICAL DATA:  Hypoxia and shortness of breath. EXAM: CT ANGIOGRAPHY CHEST WITH CONTRAST TECHNIQUE: Multidetector CT imaging of the chest was performed using the standard protocol during bolus administration of intravenous contrast. Multiplanar CT image reconstructions and MIPs were obtained to evaluate the vascular anatomy. CONTRAST:  75 cc Isovue 370 IV COMPARISON:  Chest CT PE protocol 4 weeks prior 09/05/2016. Additional prior exams reviewed. FINDINGS: Cardiovascular: There are no filling defects within the pulmonary arteries to suggest pulmonary embolus. Mild multi chamber cardiomegaly. Mitral annulus calcifications again seen. Tortuous thoracic aorta without aneurysm or dissection. No pericardial effusion. Mediastinum/Nodes: No mediastinal or hilar adenopathy. The esophagus is decompressed, small hiatal hernia. Trachea and mainstem bronchi are patent. Lungs/Pleura: Again seen eventration of right hemidiaphragm with adjacent atelectasis of the right middle and lower lobes. Minimal dependent left lower lobe atelectasis. No pulmonary edema, pleural effusion or focal consolidation. No pleural fluid. Upper Abdomen: The liver is prominent size. Postcholecystectomy. Post gastric surgery. Bilateral fat containing adrenal masses consist with myelolipomas, unchanged from 11/2014 MRI. Musculoskeletal: Degenerative change in the spine. Mild compression deformity at the T12 is chronic and unchanged from July 2017 chest CT. No acute abnormality. Review of the MIP images confirms the above findings. IMPRESSION: 1. No pulmonary embolus or acute abnormality. 2. Stable chronic elevation of right hemidiaphragm with middle and lower lobe atelectasis. 3. Additional chronic findings as described. Electronically  Signed   By: Jeb Levering M.D.   On: 09/30/2016 22:05   US Venous Img Lower Bilateral  Result Date: 09/30/2016 CLINICAL DATA:  66 y/o F; pain and swelling of the lower extremities. EXAM: BILATERAL LOWER EXTREMITY VENOUS DOPPLER ULTRASOUND TECHNIQUE: Gray-scale sonography with graded compression, as well as color Doppler and duplex ultrasound were performed to evaluate the lower extremity deep venous systems from the level  of the common femoral vein and including the common femoral, femoral, profunda femoral, popliteal and calf veins including the posterior tibial, peroneal and gastrocnemius veins when visible. The superficial great saphenous vein was also interrogated. Spectral Doppler was utilized to evaluate flow at rest and with distal augmentation maneuvers in the common femoral, femoral and popliteal veins. COMPARISON:  None. FINDINGS: RIGHT LOWER EXTREMITY Common Femoral Vein: No evidence of thrombus. Normal compressibility, respiratory phasicity and response to augmentation. Saphenofemoral Junction: No evidence of thrombus. Normal compressibility and flow on color Doppler imaging. Profunda Femoral Vein: No evidence of thrombus. Normal compressibility and flow on color Doppler imaging. Femoral Vein: No evidence of thrombus. Normal compressibility, respiratory phasicity and response to augmentation. Popliteal Vein: No evidence of thrombus. Normal compressibility, respiratory phasicity and response to augmentation. Calf Veins: Limited visibility of the peroneal vein. Otherwise no evidence for thrombosis. Superficial Great Saphenous Vein: No evidence of thrombus. Normal compressibility and flow on color Doppler imaging. Venous Reflux:  None. Other Findings:  None. LEFT LOWER EXTREMITY Common Femoral Vein: No evidence of thrombus. Normal compressibility, respiratory phasicity and response to augmentation. Saphenofemoral Junction: No evidence of thrombus. Normal compressibility and flow on color Doppler  imaging. Profunda Femoral Vein: No evidence of thrombus. Normal compressibility and flow on color Doppler imaging. Femoral Vein: No evidence of thrombus. Normal compressibility, respiratory phasicity and response to augmentation. Popliteal Vein: No evidence of thrombus. Normal compressibility, respiratory phasicity and response to augmentation. Calf Veins: Not visualized due to overlying bandages. Superficial Great Saphenous Vein: No evidence of thrombus. Normal compressibility and flow on color Doppler imaging. Venous Reflux:  None. Other Findings:  None. IMPRESSION: No evidence of DVT within either lower extremity. Limited visibility of calf veins. Electronically Signed   By: Kristine Garbe M.D.   On: 09/30/2016 20:32    ____________________________________________   PROCEDURES  Procedure(s) performed: None  Procedures  Critical Care performed: No ____________________________________________   INITIAL IMPRESSION / ASSESSMENT AND PLAN / ED COURSE  Pertinent labs & imaging results that were available during my care of the patient were reviewed by me and considered in my medical decision making (see chart for details).  66 y.o. female with recent Achilles IND presenting with intermittent episodes of hypoxia, shortness of breath and palpitations. The patient is a poor historian and the timing of her symptoms is unclear, it is also unclear whether the pulse oximeter that she has at home is working adequately. Here, the patient has had oxygen saturations of greater than 98% on room air for the entirety of her stay. However, she is at risk for PE so a get a CT scan of her chest, basic labs, cardiac evaluation, and reevaluate the patient for final disposition.  ----------------------------------------- 9:22 PM on 09/30/2016 -----------------------------------------  No evidence of DVT.  ----------------------------------------- 10:45 PM on  09/30/2016 -----------------------------------------  The patient does not have a PE on her CT scan. She has never had an O2 sat less than 100% since she has been here. The patient has no evidence of infection or other cause for shortness of breath. I will plan to have her see her primary care physician tomorrow. The patient will be discharged in stable condition. Return precautions as well as follow-up instructions were discussed.  ____________________________________________  FINAL CLINICAL IMPRESSION(S) / ED DIAGNOSES  Final diagnoses:  Shortness of breath  Palpitations         NEW MEDICATIONS STARTED DURING THIS VISIT:  New Prescriptions   No medications on file  Eula Listen, MD 09/30/16 [phone]

## 2016-09-30 NOTE — ED Notes (Signed)
Pt went to CT

## 2016-09-30 NOTE — ED Notes (Signed)
Pt went to ultrasound.

## 2016-09-30 NOTE — Discharge Instructions (Signed)
Please obtain a new pulse oximeter if you continue to want to check your oxygen level at home.  Return to the emergency department if you develop severe pain, shortness of breath, lightheadedness or fainting, fever, or any other symptoms concerning to you.

## 2016-10-01 ENCOUNTER — Telehealth: Payer: Self-pay | Admitting: Family Medicine

## 2016-10-01 NOTE — Telephone Encounter (Signed)
fyi

## 2016-10-01 NOTE — Telephone Encounter (Signed)
Karrie Meres from [facility] called and stated that pt was supposed to have a physical therapy evaluation but pt states that she cannot put weight on her foot for another 3 to 4 weeks.

## 2016-10-03 DIAGNOSIS — G4733 Obstructive sleep apnea (adult) (pediatric): Secondary | ICD-10-CM | POA: Diagnosis not present

## 2016-10-07 DIAGNOSIS — L02619 Cutaneous abscess of unspecified foot: Secondary | ICD-10-CM | POA: Diagnosis not present

## 2016-10-07 DIAGNOSIS — E069 Thyroiditis, unspecified: Secondary | ICD-10-CM | POA: Diagnosis not present

## 2016-10-07 DIAGNOSIS — Z96619 Presence of unspecified artificial shoulder joint: Secondary | ICD-10-CM | POA: Diagnosis not present

## 2016-10-07 DIAGNOSIS — L03119 Cellulitis of unspecified part of limb: Secondary | ICD-10-CM | POA: Diagnosis not present

## 2016-10-07 DIAGNOSIS — R0609 Other forms of dyspnea: Secondary | ICD-10-CM | POA: Diagnosis not present

## 2016-10-07 DIAGNOSIS — M65072 Abscess of tendon sheath, left ankle and foot: Secondary | ICD-10-CM | POA: Diagnosis not present

## 2016-10-07 DIAGNOSIS — J45909 Unspecified asthma, uncomplicated: Secondary | ICD-10-CM | POA: Diagnosis not present

## 2016-10-07 DIAGNOSIS — M129 Arthropathy, unspecified: Secondary | ICD-10-CM | POA: Diagnosis not present

## 2016-10-07 DIAGNOSIS — R0602 Shortness of breath: Secondary | ICD-10-CM | POA: Diagnosis not present

## 2016-10-07 DIAGNOSIS — I10 Essential (primary) hypertension: Secondary | ICD-10-CM | POA: Diagnosis not present

## 2016-10-13 ENCOUNTER — Telehealth: Payer: Self-pay | Admitting: Nurse Practitioner

## 2016-10-13 NOTE — Telephone Encounter (Signed)
[facility] secondary to multiple prescriptions written by Dr. [name]. Pharmacist wanting to make me aware. Information was verified and okay secondary to cellulitis and repeat I&D procedures.

## 2016-10-21 ENCOUNTER — Telehealth: Payer: Self-pay | Admitting: Pain Medicine

## 2016-10-21 DIAGNOSIS — L97322 Non-pressure chronic ulcer of left ankle with fat layer exposed: Secondary | ICD-10-CM | POA: Diagnosis not present

## 2016-10-21 NOTE — Telephone Encounter (Signed)
Spoke with Jenny Reichmann at Dr [name] office.  States patient came for appointment and is continuing to c/o pain in leg and foot past normal post op period.  She had a achilles resection done 4 weeks ago but still has a wound vac.  Jenny Reichmann states that part of her incision is healed and part is still open.  They would like some advise from Dr Dossie Arbour regarding medications and how to handle it since she is under contract with [place].  Will forward to Dr Dossie Arbour and discuss with him.

## 2016-10-21 NOTE — Telephone Encounter (Signed)
Jenny Reichmann or Kim at Dr. Selina Cooley office would like to speak with nurse regarding [name], she was in their office today. Please call before 12:30 or after 1:30

## 2016-10-21 NOTE — Telephone Encounter (Signed)
Spoke with Dr Dossie Arbour.  He states that they can cover her for post op pain.  He states that if they order what she is already taking from here, the insurance may not cover it and it would be better to order something else to cover her post op pain as long as he feels it is needed.  It should be taken as needed only.  Cindy at Dr Georgette Shell office notified.

## 2016-10-22 DIAGNOSIS — Z6841 Body Mass Index (BMI) 40.0 and over, adult: Secondary | ICD-10-CM | POA: Diagnosis not present

## 2016-10-22 DIAGNOSIS — Z9981 Dependence on supplemental oxygen: Secondary | ICD-10-CM | POA: Diagnosis not present

## 2016-10-22 DIAGNOSIS — I480 Paroxysmal atrial fibrillation: Secondary | ICD-10-CM | POA: Diagnosis not present

## 2016-10-22 DIAGNOSIS — J45909 Unspecified asthma, uncomplicated: Secondary | ICD-10-CM | POA: Diagnosis not present

## 2016-10-22 DIAGNOSIS — Z7901 Long term (current) use of anticoagulants: Secondary | ICD-10-CM | POA: Diagnosis not present

## 2016-10-22 DIAGNOSIS — E1142 Type 2 diabetes mellitus with diabetic polyneuropathy: Secondary | ICD-10-CM | POA: Diagnosis not present

## 2016-10-22 DIAGNOSIS — E1151 Type 2 diabetes mellitus with diabetic peripheral angiopathy without gangrene: Secondary | ICD-10-CM | POA: Diagnosis not present

## 2016-10-22 DIAGNOSIS — Z8614 Personal history of Methicillin resistant Staphylococcus aureus infection: Secondary | ICD-10-CM | POA: Diagnosis not present

## 2016-10-22 DIAGNOSIS — I10 Essential (primary) hypertension: Secondary | ICD-10-CM | POA: Diagnosis not present

## 2016-10-22 DIAGNOSIS — L03116 Cellulitis of left lower limb: Secondary | ICD-10-CM | POA: Diagnosis not present

## 2016-10-22 DIAGNOSIS — B9561 Methicillin susceptible Staphylococcus aureus infection as the cause of diseases classified elsewhere: Secondary | ICD-10-CM | POA: Diagnosis not present

## 2016-10-22 DIAGNOSIS — F418 Other specified anxiety disorders: Secondary | ICD-10-CM | POA: Diagnosis not present

## 2016-10-22 DIAGNOSIS — L97423 Non-pressure chronic ulcer of left heel and midfoot with necrosis of muscle: Secondary | ICD-10-CM | POA: Diagnosis not present

## 2016-10-22 DIAGNOSIS — Z794 Long term (current) use of insulin: Secondary | ICD-10-CM | POA: Diagnosis not present

## 2016-10-22 DIAGNOSIS — E11621 Type 2 diabetes mellitus with foot ulcer: Secondary | ICD-10-CM | POA: Diagnosis not present

## 2016-10-23 DIAGNOSIS — M65072 Abscess of tendon sheath, left ankle and foot: Secondary | ICD-10-CM | POA: Diagnosis not present

## 2016-10-24 DIAGNOSIS — M65072 Abscess of tendon sheath, left ankle and foot: Secondary | ICD-10-CM | POA: Diagnosis not present

## 2016-10-25 DIAGNOSIS — M65072 Abscess of tendon sheath, left ankle and foot: Secondary | ICD-10-CM | POA: Diagnosis not present

## 2016-10-27 DIAGNOSIS — Z96619 Presence of unspecified artificial shoulder joint: Secondary | ICD-10-CM | POA: Diagnosis not present

## 2016-10-27 DIAGNOSIS — M129 Arthropathy, unspecified: Secondary | ICD-10-CM | POA: Diagnosis not present

## 2016-10-27 DIAGNOSIS — L0390 Cellulitis, unspecified: Secondary | ICD-10-CM | POA: Diagnosis not present

## 2016-10-27 DIAGNOSIS — E069 Thyroiditis, unspecified: Secondary | ICD-10-CM | POA: Diagnosis not present

## 2016-10-27 DIAGNOSIS — R0602 Shortness of breath: Secondary | ICD-10-CM | POA: Diagnosis not present

## 2016-10-27 DIAGNOSIS — I10 Essential (primary) hypertension: Secondary | ICD-10-CM | POA: Diagnosis not present

## 2016-10-27 DIAGNOSIS — J45909 Unspecified asthma, uncomplicated: Secondary | ICD-10-CM | POA: Diagnosis not present

## 2016-10-27 DIAGNOSIS — L02611 Cutaneous abscess of right foot: Secondary | ICD-10-CM | POA: Diagnosis not present

## 2016-10-29 ENCOUNTER — Telehealth: Payer: Self-pay | Admitting: Family Medicine

## 2016-10-29 DIAGNOSIS — M79672 Pain in left foot: Secondary | ICD-10-CM | POA: Diagnosis not present

## 2016-10-29 DIAGNOSIS — S9032XA Contusion of left foot, initial encounter: Secondary | ICD-10-CM | POA: Diagnosis not present

## 2016-10-29 NOTE — Telephone Encounter (Signed)
Ben from [facility] called and stated that patient was supposed to have a PT evaluation yesterday and today and have not been able to get a response, they have left a vm. Please advise, thank you!  Call [place] @ [phone]

## 2016-10-30 ENCOUNTER — Telehealth: Payer: Self-pay | Admitting: *Deleted

## 2016-10-30 NOTE — Telephone Encounter (Signed)
Karrie Meres from [facility] reported pt refusing physical therapy , due to co-payment and a wound on her foot  Contact [phone]

## 2016-11-03 DIAGNOSIS — G4733 Obstructive sleep apnea (adult) (pediatric): Secondary | ICD-10-CM | POA: Diagnosis not present

## 2016-11-07 DIAGNOSIS — E069 Thyroiditis, unspecified: Secondary | ICD-10-CM | POA: Diagnosis not present

## 2016-11-07 DIAGNOSIS — I10 Essential (primary) hypertension: Secondary | ICD-10-CM | POA: Diagnosis not present

## 2016-11-07 DIAGNOSIS — R0602 Shortness of breath: Secondary | ICD-10-CM | POA: Diagnosis not present

## 2016-11-07 DIAGNOSIS — J45909 Unspecified asthma, uncomplicated: Secondary | ICD-10-CM | POA: Diagnosis not present

## 2016-11-07 DIAGNOSIS — Z96619 Presence of unspecified artificial shoulder joint: Secondary | ICD-10-CM | POA: Diagnosis not present

## 2016-11-07 DIAGNOSIS — M129 Arthropathy, unspecified: Secondary | ICD-10-CM | POA: Diagnosis not present

## 2016-11-11 DIAGNOSIS — L97511 Non-pressure chronic ulcer of other part of right foot limited to breakdown of skin: Secondary | ICD-10-CM | POA: Diagnosis not present

## 2016-11-11 DIAGNOSIS — E1143 Type 2 diabetes mellitus with diabetic autonomic (poly)neuropathy: Secondary | ICD-10-CM | POA: Diagnosis not present

## 2016-11-11 DIAGNOSIS — M65072 Abscess of tendon sheath, left ankle and foot: Secondary | ICD-10-CM | POA: Diagnosis not present

## 2016-11-17 DIAGNOSIS — E11621 Type 2 diabetes mellitus with foot ulcer: Secondary | ICD-10-CM | POA: Diagnosis not present

## 2016-11-18 ENCOUNTER — Ambulatory Visit: Payer: PPO | Attending: Nurse Practitioner | Admitting: Nurse Practitioner

## 2016-11-18 ENCOUNTER — Encounter: Payer: Self-pay | Admitting: Nurse Practitioner

## 2016-11-18 VITALS — BP 155/71 | HR 58 | Temp 97.8°F | Resp 16 | Ht 65.5 in | Wt 252.0 lb

## 2016-11-18 DIAGNOSIS — I4891 Unspecified atrial fibrillation: Secondary | ICD-10-CM | POA: Insufficient documentation

## 2016-11-18 DIAGNOSIS — Z9049 Acquired absence of other specified parts of digestive tract: Secondary | ICD-10-CM | POA: Insufficient documentation

## 2016-11-18 DIAGNOSIS — D649 Anemia, unspecified: Secondary | ICD-10-CM | POA: Diagnosis not present

## 2016-11-18 DIAGNOSIS — G4733 Obstructive sleep apnea (adult) (pediatric): Secondary | ICD-10-CM | POA: Diagnosis not present

## 2016-11-18 DIAGNOSIS — Z5181 Encounter for therapeutic drug level monitoring: Secondary | ICD-10-CM | POA: Diagnosis not present

## 2016-11-18 DIAGNOSIS — Z9889 Other specified postprocedural states: Secondary | ICD-10-CM | POA: Insufficient documentation

## 2016-11-18 DIAGNOSIS — M1710 Unilateral primary osteoarthritis, unspecified knee: Secondary | ICD-10-CM | POA: Diagnosis not present

## 2016-11-18 DIAGNOSIS — X58XXXA Exposure to other specified factors, initial encounter: Secondary | ICD-10-CM | POA: Insufficient documentation

## 2016-11-18 DIAGNOSIS — Z880 Allergy status to penicillin: Secondary | ICD-10-CM | POA: Insufficient documentation

## 2016-11-18 DIAGNOSIS — M719 Bursopathy, unspecified: Secondary | ICD-10-CM | POA: Insufficient documentation

## 2016-11-18 DIAGNOSIS — M179 Osteoarthritis of knee, unspecified: Secondary | ICD-10-CM | POA: Insufficient documentation

## 2016-11-18 DIAGNOSIS — K449 Diaphragmatic hernia without obstruction or gangrene: Secondary | ICD-10-CM | POA: Diagnosis not present

## 2016-11-18 DIAGNOSIS — J45909 Unspecified asthma, uncomplicated: Secondary | ICD-10-CM | POA: Insufficient documentation

## 2016-11-18 DIAGNOSIS — S7000XA Contusion of unspecified hip, initial encounter: Secondary | ICD-10-CM | POA: Diagnosis not present

## 2016-11-18 DIAGNOSIS — M7520 Bicipital tendinitis, unspecified shoulder: Secondary | ICD-10-CM | POA: Insufficient documentation

## 2016-11-18 DIAGNOSIS — M4804 Spinal stenosis, thoracic region: Secondary | ICD-10-CM | POA: Diagnosis not present

## 2016-11-18 DIAGNOSIS — M7540 Impingement syndrome of unspecified shoulder: Secondary | ICD-10-CM | POA: Diagnosis not present

## 2016-11-18 DIAGNOSIS — E1142 Type 2 diabetes mellitus with diabetic polyneuropathy: Secondary | ICD-10-CM | POA: Diagnosis not present

## 2016-11-18 DIAGNOSIS — E119 Type 2 diabetes mellitus without complications: Secondary | ICD-10-CM | POA: Insufficient documentation

## 2016-11-18 DIAGNOSIS — M1991 Primary osteoarthritis, unspecified site: Secondary | ICD-10-CM | POA: Insufficient documentation

## 2016-11-18 DIAGNOSIS — L02612 Cutaneous abscess of left foot: Secondary | ICD-10-CM | POA: Insufficient documentation

## 2016-11-18 DIAGNOSIS — F418 Other specified anxiety disorders: Secondary | ICD-10-CM | POA: Diagnosis not present

## 2016-11-18 DIAGNOSIS — N200 Calculus of kidney: Secondary | ICD-10-CM | POA: Insufficient documentation

## 2016-11-18 DIAGNOSIS — E1351 Other specified diabetes mellitus with diabetic peripheral angiopathy without gangrene: Secondary | ICD-10-CM | POA: Diagnosis not present

## 2016-11-18 DIAGNOSIS — M5412 Radiculopathy, cervical region: Secondary | ICD-10-CM | POA: Diagnosis not present

## 2016-11-18 DIAGNOSIS — G894 Chronic pain syndrome: Secondary | ICD-10-CM | POA: Diagnosis not present

## 2016-11-18 DIAGNOSIS — M25579 Pain in unspecified ankle and joints of unspecified foot: Secondary | ICD-10-CM | POA: Insufficient documentation

## 2016-11-18 DIAGNOSIS — G5600 Carpal tunnel syndrome, unspecified upper limb: Secondary | ICD-10-CM | POA: Insufficient documentation

## 2016-11-18 DIAGNOSIS — Z87442 Personal history of urinary calculi: Secondary | ICD-10-CM | POA: Diagnosis not present

## 2016-11-18 DIAGNOSIS — Z90710 Acquired absence of both cervix and uterus: Secondary | ICD-10-CM | POA: Diagnosis not present

## 2016-11-18 DIAGNOSIS — R12 Heartburn: Secondary | ICD-10-CM | POA: Insufficient documentation

## 2016-11-18 DIAGNOSIS — I82409 Acute embolism and thrombosis of unspecified deep veins of unspecified lower extremity: Secondary | ICD-10-CM | POA: Insufficient documentation

## 2016-11-18 DIAGNOSIS — M7550 Bursitis of unspecified shoulder: Secondary | ICD-10-CM

## 2016-11-18 MED ORDER — GABAPENTIN 800 MG PO TABS: 800.0000 mg | ORAL_TABLET | Freq: Four times a day (QID) | ORAL | 0 refills | Status: DC

## 2016-11-18 MED ORDER — HYDROCODONE-ACETAMINOPHEN 5-325 MG PO TABS: 1.0000 | ORAL_TABLET | Freq: Three times a day (TID) | ORAL | 0 refills | Status: DC | PRN

## 2016-11-18 MED ORDER — DULOXETINE HCL 60 MG PO CPEP: 60.0000 mg | ORAL_CAPSULE | Freq: Every day | ORAL | 0 refills | Status: DC

## 2016-11-18 NOTE — Progress Notes (Signed)
Patient is s/p staph infection on left heel that has been treated via surgery and wound therapy by Dr [name].

## 2016-11-18 NOTE — Progress Notes (Signed)
Patient's Name: [name]  MRN: [id number]  Referring Provider: [name], DO  DOB: 01/15/1951  PCP: [name], DO  DOS: 11/18/2016  Note by: Vevelyn Francois NP  Service setting: Ambulatory outpatient  Specialty: Interventional Pain Management  Location: [facility] (AMB) Pain Management Facility    Patient type: Established    Primary Reason(s) for Visit: Encounter for prescription drug management. (Level of risk: moderate)  CC: Foot Pain (neuropathies) and Hand Pain (neuropathies)  HPI  [name] is a 66 y.o. year old, female patient, who comes today for a medication management evaluation. She has  Peripheral vascular disease due to secondary diabetes mellitus ([place]); Apnea, sleep; DM (diabetes mellitus) type II controlled peripheral vascular disorder ([place]); Diabetic peripheral neuropathy associated with type 2 diabetes mellitus ([place]); Neuropathy of right lateral femoral cutaneous nerve; Osteoarthritis, multiple sites; Chronic pain syndrome; Cervical radiculitis; Contusion of hip; Cubital tunnel syndrome on right; Deep venous thrombosis of lower extremity ([place]); Diabetes mellitus ([place]); Disorder of bursae of shoulder region; Foot pain; Impingement syndrome of shoulder region; Pain in joint involving ankle and foot; Osteoarthritis of knee; and Spinal stenosis of thoracic region on her problem list. Her primarily concern today is the Foot Pain (neuropathies) and Hand Pain (neuropathies)  Pain Assessment: Location: Right, Left Foot (hands) Radiating:   Onset: More than a month ago Duration: Chronic pain Quality: Tightness, Burning (burning and stinging in feet, tightness in hands) Severity: 3 /10 (self-reported pain score)  Note: Reported level is compatible with observation.                   Effect on ADL: patient feels that gait is unstable d/t neuropathies.  Timing: Constant Modifying factors: medications help  [name] was last scheduled for an appointment on 10/13/2016 for  medication management. During today's appointment we reviewed [name]'s chronic pain status, as well as her outpatient medication regimen. She continues to have her left foot pain. She is hopeful that it healing as planned and overall her depression has improved. She admits that what is more tender now is 4 areas that she has developed. She admits that she is doing the wet to dry dressing changes since removal of the wound vac.  The patient  reports that she does not use drugs. Her body mass index is 41.3 kg/m.  Further details on both, my assessment(s), as well as the proposed treatment plan, please see below.  Controlled Substance Pharmacotherapy Assessment REMS (Risk Evaluation and Mitigation Strategy)  Analgesic:Hydrocodone/APAP 5/325 one tablet every 8 hours (15 mg/day) MME/day:15 mg/day  Dawn Billow, RN  11/18/2016 11:32 AM  Sign at close encounter Nursing Pain Medication Assessment:  Safety precautions to be maintained throughout the outpatient stay will include: orient to surroundings, keep bed in low position, maintain call bell within reach at all times, provide assistance with transfer out of bed and ambulation.  Medication Inspection Compliance: Pill count conducted under aseptic conditions, in front of the patient. Neither the pills nor the bottle was removed from the patient's sight at any time. Once count was completed pills were immediately returned to the patient in their original bottle.  Medication: Hydrocodone/APAP Pill/Patch Count: 77 of 90 pills remain Pill/Patch Appearance: Markings consistent with prescribed medication Bottle Appearance: Standard pharmacy container. Clearly labeled. Filled Date: 08 / 24 / 2018 Last Medication intake:  Clinton Gallant, RN  11/18/2016 11:21 AM  Sign at close encounter Patient is s/p staph infection on left heel that  has been treated via surgery and wound therapy by Dr [name].      Pharmacokinetics: Liberation and absorption (onset of action): WNL Distribution (time to peak effect): WNL Metabolism and excretion (duration of action): WNL         Pharmacodynamics: Desired effects: Analgesia: Ms. Enzor reports >50% benefit. Functional ability: Patient reports that medication allows her to accomplish basic ADLs Clinically meaningful improvement in function (CMIF): Sustained CMIF goals met Perceived effectiveness: Described as relatively effective, allowing for increase in activities of daily living (ADL) Undesirable effects: Side-effects or Adverse reactions: None reported Monitoring: [place] PMP: Online review of the past 69-monthperiod conducted. Compliant with practice rules and regulations List of all UDS test(s) done:  Lab Results  Component Value Date   TOXASSSELUR FINAL 05/19/2016   TCayeyFINAL 09/17/2015   TOXASSSELUR FINAL 07/12/2015   TOXASSSELUR FINAL 04/17/2015   TOXASSSELUR FINAL 01/16/2015   Last UDS on record: ToxAssure Select 13  Date Value Ref Range Status  05/19/2016 FINAL  Final    Comment:    ==================================================================== TOXASSURE SELECT 13 (MW) ==================================================================== Test                             Result       Flag       Units Drug Present and Declared for Prescription Verification   Alpha-hydroxyalprazolam        92           EXPECTED   ng/mg creat    Alpha-hydroxyalprazolam is an expected metabolite of alprazolam.    Source of alprazolam is a scheduled prescription medication. Drug Absent but Declared for Prescription Verification   Hydrocodone                    Not Detected UNEXPECTED ng/mg creat ==================================================================== Test                      Result    Flag   Units      Ref Range   Creatinine              106              mg/dL       >=20 ==================================================================== Declared Medications:  The flagging and interpretation on this report are based on the  following declared medications.  Unexpected results may arise from  inaccuracies in the declared medications.  **Note: The testing scope of this panel includes these medications:  Alprazolam  Hydrocodone (Hydrocodone-Acetaminophen)  **Note: The testing scope of this panel does not include following  reported medications:  Acetaminophen  Acetaminophen (Hydrocodone-Acetaminophen)  Albuterol  Bupropion  Dabigatran  Duloxetine  Estradiol  Fluticasone  Gabapentin  Levothyroxine  Melatonin  Meloxicam  Metformin  Metoprolol  Mirabegron  Montelukast  Oxybutynin  Pantoprazole  Quinapril ==================================================================== For clinical consultation, please call ([phone] ====================================================================    UDS interpretation: Compliant          Medication Assessment Form: Reviewed. Patient indicates being compliant with therapy Treatment compliance: Compliant Risk Assessment Profile: Aberrant behavior: See prior evaluations. None observed or detected today Comorbid factors increasing risk of overdose: See prior notes. No additional risks detected today Risk of substance use disorder (SUD): Low     Opioid Risk Tool - 11/18/16 1126      Family History of Substance Abuse   Alcohol Negative   Illegal Drugs Positive Female  daughter  Rx Drugs Negative     Personal History of Substance Abuse   Alcohol Negative   Illegal Drugs Negative   Rx Drugs Negative     Psychological Disease   Psychological Disease Positive   ADD Negative   OCD Negative   Bipolar Negative   Schizophrenia Negative   Depression Positive  patient states that she was very depressed until about 3 weeks ago after having undergone surgery and imobility after surgery       Total Score   Opioid Risk Tool Scoring 5   Opioid Risk Interpretation Moderate Risk     ORT Scoring interpretation table:  Score <3 = Low Risk for SUD  Score between 4-7 = Moderate Risk for SUD  Score >8 = High Risk for Opioid Abuse   Risk Mitigation Strategies:  Patient Counseling: Covered Patient-Prescriber Agreement (PPA): Present and active  Notification to other healthcare providers: Done  Pharmacologic Plan: No change in therapy, at this time  Laboratory Chemistry  Inflammation Markers (CRP: Acute Phase) (ESR: Chronic Phase) Lab Results  Component Value Date   CRP 14.7 (H) 09/08/2016   ESRSEDRATE 58 (H) 09/08/2016                 Renal Function Markers Lab Results  Component Value Date   BUN 16 09/30/2016   CREATININE 0.97 09/30/2016   GFRAA >60 09/30/2016   GFRNONAA 59 (L) 09/30/2016                 Hepatic Function Markers Lab Results  Component Value Date   AST 11 06/09/2016   ALT 9 06/09/2016   ALBUMIN 4.2 06/09/2016   ALKPHOS 83 06/09/2016                 Electrolytes Lab Results  Component Value Date   NA 138 09/30/2016   K 5.1 09/30/2016   CL 101 09/30/2016   CALCIUM 9.3 09/30/2016   MG 1.8 04/17/2015                 Neuropathy Markers No results found for: [id number]               Bone Pathology Markers Lab Results  Component Value Date   ALKPHOS 83 06/09/2016   CALCIUM 9.3 09/30/2016                 Coagulation Parameters Lab Results  Component Value Date   INR 1.18 09/26/2015   LABPROT 15.2 (H) 09/26/2015   APTT 42 (H) 06/24/2015   PLT 261 09/30/2016                 Cardiovascular Markers Lab Results  Component Value Date   BNP 216.0 (H) 09/30/2016   HGB 11.8 (L) 09/30/2016   HCT 34.6 (L) 09/30/2016                 Note: Lab results reviewed.  Recent Diagnostic Imaging Review  Ct Angio Chest Pe W And/or Wo Contrast  Result Date: 09/30/2016 CLINICAL DATA:  Hypoxia and shortness of breath. EXAM: CT ANGIOGRAPHY  CHEST WITH CONTRAST TECHNIQUE: Multidetector CT imaging of the chest was performed using the standard protocol during bolus administration of intravenous contrast. Multiplanar CT image reconstructions and MIPs were obtained to evaluate the vascular anatomy. CONTRAST:  75 cc Isovue 370 IV COMPARISON:  Chest CT PE protocol 4 weeks prior 09/05/2016. Additional prior exams reviewed. FINDINGS: Cardiovascular: There are no filling defects within the pulmonary arteries to suggest pulmonary embolus.  Mild multi chamber cardiomegaly. Mitral annulus calcifications again seen. Tortuous thoracic aorta without aneurysm or dissection. No pericardial effusion. Mediastinum/Nodes: No mediastinal or hilar adenopathy. The esophagus is decompressed, small hiatal hernia. Trachea and mainstem bronchi are patent. Lungs/Pleura: Again seen eventration of right hemidiaphragm with adjacent atelectasis of the right middle and lower lobes. Minimal dependent left lower lobe atelectasis. No pulmonary edema, pleural effusion or focal consolidation. No pleural fluid. Upper Abdomen: The liver is prominent size. Postcholecystectomy. Post gastric surgery. Bilateral fat containing adrenal masses consist with myelolipomas, unchanged from 11/2014 MRI. Musculoskeletal: Degenerative change in the spine. Mild compression deformity at the T12 is chronic and unchanged from July 2017 chest CT. No acute abnormality. Review of the MIP images confirms the above findings. IMPRESSION: 1. No pulmonary embolus or acute abnormality. 2. Stable chronic elevation of right hemidiaphragm with middle and lower lobe atelectasis. 3. Additional chronic findings as described. Electronically Signed   By: Jeb Levering M.D.   On: 09/30/2016 22:05   US Venous Img Lower Bilateral  Result Date: 09/30/2016 CLINICAL DATA:  66 y/o F; pain and swelling of the lower extremities. EXAM: BILATERAL LOWER EXTREMITY VENOUS DOPPLER ULTRASOUND TECHNIQUE: Gray-scale sonography with graded  compression, as well as color Doppler and duplex ultrasound were performed to evaluate the lower extremity deep venous systems from the level of the common femoral vein and including the common femoral, femoral, profunda femoral, popliteal and calf veins including the posterior tibial, peroneal and gastrocnemius veins when visible. The superficial great saphenous vein was also interrogated. Spectral Doppler was utilized to evaluate flow at rest and with distal augmentation maneuvers in the common femoral, femoral and popliteal veins. COMPARISON:  None. FINDINGS: RIGHT LOWER EXTREMITY Common Femoral Vein: No evidence of thrombus. Normal compressibility, respiratory phasicity and response to augmentation. Saphenofemoral Junction: No evidence of thrombus. Normal compressibility and flow on color Doppler imaging. Profunda Femoral Vein: No evidence of thrombus. Normal compressibility and flow on color Doppler imaging. Femoral Vein: No evidence of thrombus. Normal compressibility, respiratory phasicity and response to augmentation. Popliteal Vein: No evidence of thrombus. Normal compressibility, respiratory phasicity and response to augmentation. Calf Veins: Limited visibility of the peroneal vein. Otherwise no evidence for thrombosis. Superficial Great Saphenous Vein: No evidence of thrombus. Normal compressibility and flow on color Doppler imaging. Venous Reflux:  None. Other Findings:  None. LEFT LOWER EXTREMITY Common Femoral Vein: No evidence of thrombus. Normal compressibility, respiratory phasicity and response to augmentation. Saphenofemoral Junction: No evidence of thrombus. Normal compressibility and flow on color Doppler imaging. Profunda Femoral Vein: No evidence of thrombus. Normal compressibility and flow on color Doppler imaging. Femoral Vein: No evidence of thrombus. Normal compressibility, respiratory phasicity and response to augmentation. Popliteal Vein: No evidence of thrombus. Normal compressibility,  respiratory phasicity and response to augmentation. Calf Veins: Not visualized due to overlying bandages. Superficial Great Saphenous Vein: No evidence of thrombus. Normal compressibility and flow on color Doppler imaging. Venous Reflux:  None. Other Findings:  None. IMPRESSION: No evidence of DVT within either lower extremity. Limited visibility of calf veins. Electronically Signed   By: Kristine Garbe M.D.   On: 09/30/2016 20:32   Note: Imaging results reviewed.          Meds   Current Outpatient Prescriptions:  .  albuterol (PROVENTIL HFA;VENTOLIN HFA) 108 (90 Base) MCG/ACT inhaler, Inhale 1-2 puffs into the lungs every 4 (four) hours as needed for wheezing or shortness of breath. (Patient taking differently: Inhale 2 puffs into the lungs every 4 (four)  hours as needed for wheezing or shortness of breath. ), Disp: 8 g, Rfl: 2 .  albuterol (PROVENTIL) (2.5 MG/3ML) 0.083% nebulizer solution, Take 3 mLs (2.5 mg total) by nebulization every 6 (six) hours as needed for wheezing or shortness of breath., Disp: 150 mL, Rfl: 1 .  ALPRAZolam (XANAX) 0.5 MG tablet, TAKE ONE TABLET TWICE DAILY AS NEEDED, Disp: 60 tablet, Rfl: 1 .  buPROPion (WELLBUTRIN XL) 300 MG 24 hr tablet, TAKE ONE TABLET BY MOUTH EVERY DAY, Disp: 90 tablet, Rfl: 1 .  calcium carbonate (OSCAL) 1500 (600 Ca) MG TABS tablet, Take 600 mg of elemental calcium by mouth 2 (two) times daily with a meal., Disp: , Rfl:  .  cyanocobalamin 500 MCG tablet, Take 500 mcg by mouth daily., Disp: , Rfl:  .  dabigatran (PRADAXA) 150 MG CAPS capsule, TAKE 1 CAPSULE BY MOUTH TWICE DAILY, Disp: 180 capsule, Rfl: 3 .  Dexlansoprazole 30 MG capsule, Take 1 capsule (30 mg total) by mouth daily., Disp: 90 capsule, Rfl: 1 .  [START ON 12/13/2016] DULoxetine (CYMBALTA) 60 MG capsule, Take 1 capsule (60 mg total) by mouth daily., Disp: 90 capsule, Rfl: 0 .  ESTRACE VAGINAL 0.1 MG/GM vaginal cream, INSERT 1 GRAM VAGINALLY TWICE A WEEK (Patient taking  differently: INSERT 1 GRAM VAGINALLY TWICE A WEEK (Tuesday and Thursday)), Disp: 42.5 g, Rfl: 6 .  [START ON 12/13/2016] gabapentin (NEURONTIN) 800 MG tablet, Take 1 tablet (800 mg total) by mouth 4 (four) times daily., Disp: 360 tablet, Rfl: 0 .  hydrocortisone cream 1 %, Apply 1 application topically daily as needed for itching., Disp: , Rfl:  .  levocetirizine (XYZAL) 5 MG tablet, Take 1 tablet (5 mg total) by mouth every evening., Disp: 90 tablet, Rfl: 3 .  levothyroxine (SYNTHROID, LEVOTHROID) 150 MCG tablet, TAKE ONE TABLET BY MOUTH EVERY MORNING, Disp: 90 tablet, Rfl: 3 .  meloxicam (MOBIC) 15 MG tablet, Take 15 mg by mouth daily. , Disp: , Rfl:  .  metFORMIN (GLUCOPHAGE) 1000 MG tablet, TAKE ONE TABLET BY MOUTH TWICE DAILY (Patient taking differently: TAKE ONE TABLET IN THE MORNING AND HALF TABLET AT NIGHT), Disp: 180 tablet, Rfl: 1 .  metoprolol tartrate (LOPRESSOR) 25 MG tablet, TAKE ONE TABLET TWICE DAILY, Disp: 180 tablet, Rfl: 3 .  mirabegron ER (MYRBETRIQ) 25 MG TB24 tablet, Take 1 tablet (25 mg total) by mouth daily., Disp: 30 tablet, Rfl: 12 .  montelukast (SINGULAIR) 10 MG tablet, TAKE ONE TABLET BY MOUTH EVERY DAY (Patient taking differently: TAKE ONE TABLET BY MOUTH EVERY evening), Disp: 90 tablet, Rfl: 3 .  Multiple Vitamin (MULTIVITAMIN WITH MINERALS) TABS tablet, Take 1 tablet by mouth daily., Disp: , Rfl:  .  oxybutynin (DITROPAN-XL) 10 MG 24 hr tablet, Take 1 tablet (10 mg total) by mouth daily. (Patient taking differently: Take 10 mg by mouth at bedtime. ), Disp: 30 tablet, Rfl: 12 .  quinapril (ACCUPRIL) 20 MG tablet, TAKE ONE TABLET EVERY DAY, Disp: 90 tablet, Rfl: 3 .  [START ON 12/13/2016] HYDROcodone-acetaminophen (NORCO/VICODIN) 5-325 MG tablet, Take 1 tablet by mouth every 8 (eight) hours as needed for severe pain., Disp: 90 tablet, Rfl: 0 .  [START ON 01/12/2017] HYDROcodone-acetaminophen (NORCO/VICODIN) 5-325 MG tablet, Take 1 tablet by mouth every 8 (eight) hours as  needed for severe pain., Disp: 90 tablet, Rfl: 0 .  [START ON 02/11/2017] HYDROcodone-acetaminophen (NORCO/VICODIN) 5-325 MG tablet, Take 1 tablet by mouth every 8 (eight) hours as needed for severe pain., Disp: 90 tablet, Rfl:  0  ROS  Constitutional: Denies any fever or chills Gastrointestinal: No reported hemesis, hematochezia, vomiting, or acute GI distress Musculoskeletal: Denies any acute onset joint swelling, redness, loss of ROM, or weakness Neurological: No reported episodes of acute onset apraxia, aphasia, dysarthria, agnosia, amnesia, paralysis, loss of coordination, or loss of consciousness  Allergies  Ms. Bardwell is allergic to penicillins; aspirin; and terramycin [oxytetracycline].  [place]  Drug: Ms. Koelzer  reports that she does not use drugs. Alcohol:  reports that she does not drink alcohol. Tobacco:  reports that she has never smoked. She has never used smokeless tobacco. Medical:  has a past medical history of Anxiety and depression; Arthritis; Asthma; Cystocele; Depression; Diabetes ([place]); DVT (deep venous thrombosis) ([place]); GERD (gastroesophageal reflux disease); Heart murmur; History of IBS; History of methicillin resistant staphylococcus aureus (MRSA) (2008); Hypothyroidism; Insomnia; Kidney stone; Neuropathy involving both lower extremities; Obesity, Class II, BMI 35-39.9, with comorbidity; Paroxysmal atrial fibrillation ([place]) (2007); Peripheral vascular disease ([place]); Sleep apnea; and Venous stasis dermatitis of both lower extremities. Surgical: Ms. Hird  has a past surgical history that includes Ankle surgery (Right); Vaginal hysterectomy; Sleeve Gastroplasty; Lithotripsy; Kidney surgery (Right); transthoracic echocardiogram (08/03/2013); transesophageal echocardiogram (08/08/2013); Tonsillectomy; Total knee arthroplasty; Hiatal hernia repair; Joint replacement; Cholecystectomy; I&D extremity (Left, 06/27/2015); Application if wound vac (Left, 06/27/2015); removal of left  hematoma (Left); NM GATED MYOVIEW [facility] HX) (February 2017); Reverse shoulder arthroplasty (Right, 10/08/2015); Hernia repair; Ulnar nerve transposition (Right, 08/06/2016); arm surgery; Irrigation and debridement abscess (Left, 09/07/2016); Incision and drainage of wound (Left, 08/25/4648); and Application if wound vac (Left, 09/25/2016). Family: family history includes Alcohol abuse in her father; Arthritis in her maternal grandfather, maternal grandmother, mother, paternal grandfather, and paternal grandmother; Breast cancer in her maternal aunt; Cancer in her father and maternal aunt; Cerebral aneurysm in her father; Diabetes in her father, maternal grandmother, and paternal grandmother; Heart disease in her maternal grandfather; Hypertension in her father, maternal grandfather, maternal grandmother, paternal grandfather, and paternal grandmother; Kidney disease in her mother; Mental illness in her sister; Peripheral vascular disease in her father; Skin cancer in her father; Varicose Veins in her mother.  Constitutional Exam  General appearance: Well nourished, well developed, and well hydrated. In no apparent acute distress Vitals:   11/18/16 1113  BP: (!) 155/71  Pulse: (!) 58  Resp: 16  Temp: 97.8 F (36.6 C)  TempSrc: Oral  SpO2: 98%  Weight: 252 lb (114.3 kg)  Height: 5' 5.5" (1.664 m)   Psych/Mental status: Alert, oriented x 3 (person, place, & time)       Eyes: PERLA Respiratory: No evidence of acute respiratory distress  Thoracic Spine Area Exam  Skin & Axial Inspection: No masses, redness, or swelling Alignment: Symmetrical Functional ROM: Unrestricted ROM Stability: No instability detected Muscle Tone/Strength: Functionally intact. No obvious neuro-muscular anomalies detected. Sensory (Neurological): Unimpaired Muscle strength & Tone: No palpable anomalies   Gait & Posture Assessment  Ambulation: Patient ambulates using a walker Gait: Relatively normal for age and body  habitus Posture: WNL   Lower Extremity Exam    Side: Right lower extremity  Side: Left lower extremity  Skin & Extremity Inspection: Skin color, temperature, and hair growth are WNL. No peripheral edema or cyanosis. No masses, redness, swelling, asymmetry, or associated skin lesions. No contractures.  Skin & Extremity Inspection: Ace wrap and surgical shoe worn  Functional ROM: Unrestricted ROM          Functional ROM: Unrestricted ROM  Muscle Tone/Strength: Functionally intact. No obvious neuro-muscular anomalies detected.  Muscle Tone/Strength: Functionally intact. No obvious neuro-muscular anomalies detected.  Sensory (Neurological): Unimpaired  Sensory (Neurological): Unimpaired  Palpation: No palpable anomalies  Palpation: No palpable anomalies   Assessment  Primary Diagnosis & Pertinent Problem List: The primary encounter diagnosis was Diabetic peripheral neuropathy associated with type 2 diabetes mellitus ([place]). Diagnoses of Peripheral vascular disease due to secondary diabetes mellitus ([place]), Spinal stenosis of thoracic region, Depression with anxiety, and Chronic pain syndrome were also pertinent to this visit.  Status Diagnosis  Controlled Controlled Controlled 1. Diabetic peripheral neuropathy associated with type 2 diabetes mellitus ([place])   2. Peripheral vascular disease due to secondary diabetes mellitus ([place])   3. Spinal stenosis of thoracic region   4. Depression with anxiety   5. Chronic pain syndrome     Problems updated and reviewed during this visit: Problem  Osteoarthritis of Knee  Spinal Stenosis of Thoracic Region  Obstructive Sleep Apnea of Adult  Anemia  Asthma  Atrial Fibrillation ([facility])  Biceps Tendinitis  Carpal Tunnel Syndrome  Contusion of Hip  Deep Venous Thrombosis of Lower Extremity ([facility])  Diabetes Mellitus ([facility])  Disorder of Bursae of Shoulder Region  Heartburn  Hiatal Hernia  Pain in Joint Involving Ankle and Foot  Kidney Stone   Localized, Primary Osteoarthritis  Morbid Obesity ([facility])  Cellulitis and Abscess of Foot  Abscess of Tendon of Left Foot   Overview:  Last Assessment & Plan:  New problem. Hospital course reviewed. Meds reconciled. Continue Cipro and Keflex. Lengthy discussion about treatment options regarding her wound. Discussed case with podiatrist. Options - Surgery and wound vac, remove portion of achilles to get wound to heal, plastic surgery referral. Podiatry to discuss tomorrow (patient has office visit).    Cubital Tunnel Syndrome On Right  Chronic Prescription Benzodiazepine Use  Cervical Radiculitis  Foot Pain  Impingement Syndrome of Shoulder Region   Plan of Care  Pharmacotherapy (Medications Ordered): Meds ordered this encounter  Medications  . gabapentin (NEURONTIN) 800 MG tablet    Sig: Take 1 tablet (800 mg total) by mouth 4 (four) times daily.    Dispense:  360 tablet    Refill:  0    Do not place this medication, or any other prescription from our practice, on "Automatic Refill". Patient may have prescription filled one day early if pharmacy is closed on scheduled refill date.    Order Specific Question:   Supervising Provider    Answer:   Milinda Pointer [phone]  . DULoxetine (CYMBALTA) 60 MG capsule    Sig: Take 1 capsule (60 mg total) by mouth daily.    Dispense:  90 capsule    Refill:  0    Do not place this medication, or any other prescription from our practice, on "Automatic Refill". Patient may have prescription filled one day early if pharmacy is closed on scheduled refill date.    Order Specific Question:   Supervising Provider    Answer:   Milinda Pointer [phone]  . HYDROcodone-acetaminophen (NORCO/VICODIN) 5-325 MG tablet    Sig: Take 1 tablet by mouth every 8 (eight) hours as needed for severe pain.    Dispense:  90 tablet    Refill:  0    Do not add this medication to the electronic "Automatic Refill" notification system. Patient may have prescription  filled one day early if pharmacy is closed on scheduled refill date. Do not fill until:12/13/2016 To last until: 01/12/2017    Order  Specific Question:   Supervising Provider    Answer:   Milinda Pointer [phone]  . HYDROcodone-acetaminophen (NORCO/VICODIN) 5-325 MG tablet    Sig: Take 1 tablet by mouth every 8 (eight) hours as needed for severe pain.    Dispense:  90 tablet    Refill:  0    Do not add this medication to the electronic "Automatic Refill" notification system. Patient may have prescription filled one day early if pharmacy is closed on scheduled refill date. Do not fill until: 01/12/2017 To last until: 02/11/2017    Order Specific Question:   Supervising Provider    Answer:   Milinda Pointer [phone]  . HYDROcodone-acetaminophen (NORCO/VICODIN) 5-325 MG tablet    Sig: Take 1 tablet by mouth every 8 (eight) hours as needed for severe pain.    Dispense:  90 tablet    Refill:  0    Do not add this medication to the electronic "Automatic Refill" notification system. Patient may have prescription filled one day early if pharmacy is closed on scheduled refill date. Do not fill until: 02/11/2017 To last until: 03/13/2017    Order Specific Question:   Supervising Provider    Answer:   Milinda Pointer [phone]   New Prescriptions   No medications on file   Medications administered today: Ms. Critcher had no medications administered during this visit. Lab-work, procedure(s), and/or referral(s): No orders of the defined types were placed in this encounter.  Imaging and/or referral(s): None  Interventional therapies: Planned, scheduled, and/or pending:   Continue with current regimen (Always stop Pradaxa x 5 days prior to any blocks.)   Considering:   Diagnostic Right suprascapular nerve block Diagnostic right intra-articular knee injection Possible series of 5 Hyalgan knee injections Diagnostic right-sided genicular nerve block Possible right-sided genicular  nerve radiofrequencyablation  Palliative right intra-articular shoulder joint injection Diagnostic right-sided suprascapular nerve block Possible right-sided suprascapular nerve radiofrequencyablation  Palliative right lateral femoral cutaneous nerve block Palliative repeat right sided lateral femoral continuous nerve radiofrequencyablation   Palliative PRN treatment(s):   Diagnostic right intra-articular knee injection Possible series of 5 Hyalgan knee injections Diagnostic right-sided genicular nerve block Palliative right intra-articular shoulder joint injection Diagnostic right-sided suprascapular nerve block Palliative right lateral femoral cutaneous nerve block Palliative repeat right sided lateral femoral continuous nerve radiofrequencyablation   Provider-requested follow-up: Return in about 3 months (around 02/18/2017) for MedMgmt.  Future Appointments Date Time Provider [place]  12/03/2016 2:45 PM Nori Riis, PA-C [facility] None  01/11/2017 2:00 PM Leone Haven, MD [facility] None  02/18/2017 9:00 AM Vevelyn Francois, NP [facility] None   Primary Care Physician: [name], DO Location: [facility] Note by: Vevelyn Francois NP Date: 11/18/2016; Time: 12:45 PM  Pain Score Disclaimer: We use the NRS-11 scale. This is a self-reported, subjective measurement of pain severity with only modest accuracy. It is used primarily to identify changes within a particular patient. It must be understood that outpatient pain scales are significantly less accurate that those used for research, where they can be applied under ideal controlled circumstances with minimal exposure to variables. In reality, the score is likely to be a combination of pain intensity and pain affect, where pain affect describes the degree of emotional arousal or changes in action readiness caused by the sensory experience of pain. Factors such as social and work  situation, setting, emotional state, anxiety levels, expectation, and prior pain experience may influence pain perception and show large inter-individual differences that  may also be affected by time variables.  Patient instructions provided during this appointment: Patient Instructions    ____________________________________________________________________________________________  Medication Rules  Applies to: All patients receiving prescriptions (written or electronic).  Pharmacy of record: Pharmacy where electronic prescriptions will be sent. If written prescriptions are taken to a different pharmacy, please inform the nursing staff. The pharmacy listed in the electronic medical record should be the one where you would like electronic prescriptions to be sent.  Prescription refills: Only during scheduled appointments. Applies to both, written and electronic prescriptions.  NOTE: The following applies primarily to controlled substances (Opioid* Pain Medications).   Patient's responsibilities: 1. Pain Pills: Bring all pain pills to every appointment (except for procedure appointments). 2. Pill Bottles: Bring pills in original pharmacy bottle. Always bring newest bottle. Bring bottle, even if empty. 3. Medication refills: You are responsible for knowing and keeping track of what medications you need refilled. The day before your appointment, write a list of all prescriptions that need to be refilled. Bring that list to your appointment and give it to the admitting nurse. Prescriptions will be written only during appointments. If you forget a medication, it will not be "Called in", "Faxed", or "electronically sent". You will need to get another appointment to get these prescribed. 4. Prescription Accuracy: You are responsible for carefully inspecting your prescriptions before leaving our office. Have the discharge nurse carefully go over each prescription with you, before taking them home. Make  sure that your name is accurately spelled, that your address is correct. Check the name and dose of your medication to make sure it is accurate. Check the number of pills, and the written instructions to make sure they are clear and accurate. Make sure that you are given enough medication to last until your next medication refill appointment. 5. Taking Medication: Take medication as prescribed. Never take more pills than instructed. Never take medication more frequently than prescribed. Taking less pills or less frequently is permitted and encouraged, when it comes to controlled substances (written prescriptions).  6. Inform other Doctors: Always inform, all of your healthcare providers, of all the medications you take. 7. Pain Medication from other Providers: You are not allowed to accept any additional pain medication from any other Doctor or Healthcare provider. There are two exceptions to this rule. (see below) In the event that you require additional pain medication, you are responsible for notifying us, as stated below. 8. Medication Agreement: You are responsible for carefully reading and following our Medication Agreement. This must be signed before receiving any prescriptions from our practice. Safely store a copy of your signed Agreement. Violations to the Agreement will result in no further prescriptions. (Additional copies of our Medication Agreement are available upon request.) 9. Laws, Rules, & Regulations: All patients are expected to follow all Federal and [employer], Rules, [employer]. Ignorance of the Laws does not constitute a valid excuse. The use of any illegal substances is prohibited. 10. Adopted CDC guidelines & recommendations: Target dosing levels will be at or below 60 MME/day. Use of benzodiazepines** is not recommended.  Exceptions: There are only two exceptions to the rule of not receiving pain medications from other Healthcare Providers. 1. Exception #1  (Emergencies): In the event of an emergency (i.e.: accident requiring emergency care), you are allowed to receive additional pain medication. However, you are responsible for: As soon as you are able, call our office [phone], at any time of the day or night, and leave a  message stating your name, the date and nature of the emergency, and the name and dose of the medication prescribed. In the event that your call is answered by a member of our staff, make sure to document and save the date, time, and the name of the person that took your information.  2. Exception #2 (Planned Surgery): In the event that you are scheduled by another doctor or dentist to have any type of surgery or procedure, you are allowed (for a period no longer than 30 days), to receive additional pain medication, for the acute post-op pain. However, in this case, you are responsible for picking up a copy of our "Post-op Pain Management for Surgeons" handout, and giving it to your surgeon or dentist. This document is available at our office, and does not require an appointment to obtain it. Simply go to our office during business hours (Monday-Thursday from 8:00 AM to 4:00 PM) (Friday 8:00 AM to 12:00 Noon) or if you have a scheduled appointment with [place], prior to your surgery, and ask for it by name. In addition, you will need to provide [place] with your name, name of your surgeon, type of surgery, and date of procedure or surgery.  *Opioid medications include: morphine, codeine, oxycodone, oxymorphone, hydrocodone, hydromorphone, meperidine, tramadol, tapentadol, buprenorphine, fentanyl, methadone. **Benzodiazepine medications include: diazepam (Valium), alprazolam (Xanax), clonazepam (Klonopine), lorazepam (Ativan), clorazepate (Tranxene), chlordiazepoxide (Librium), estazolam (Prosom), oxazepam (Serax), temazepam (Restoril), triazolam  (Halcion)  ____________________________________________________________________________________________  BMI Assessment: Estimated body mass index is 41.3 kg/m as calculated from the following:   Height as of this encounter: 5' 5.5" (1.664 m).   Weight as of this encounter: 252 lb (114.3 kg).  BMI interpretation table: BMI level Category Range association with higher incidence of chronic pain  <18 kg/m2 Underweight   18.5-24.9 kg/m2 Ideal body weight   25-29.9 kg/m2 Overweight Increased incidence by 20%  30-34.9 kg/m2 Obese (Class I) Increased incidence by 68%  35-39.9 kg/m2 Severe obesity (Class II) Increased incidence by 136%  >40 kg/m2 Extreme obesity (Class III) Increased incidence by 254%   BMI Readings from Last 4 Encounters:  11/18/16 41.30 kg/m  09/30/16 43.27 kg/m  09/25/16 43.27 kg/m  09/21/16 43.77 kg/m   Wt Readings from Last 4 Encounters:  11/18/16 252 lb (114.3 kg)  09/30/16 260 lb (117.9 kg)  09/25/16 260 lb (117.9 kg)  09/21/16 263 lb (119.3 kg)   You were given 3 prescriptions for Hydrocodone today. A prescription for Cymbalta and Neurontin was sent to your pharmacy.

## 2016-11-18 NOTE — Patient Instructions (Addendum)
____________________________________________________________________________________________  Medication Rules  Applies to: All patients receiving prescriptions (written or electronic).  Pharmacy of record: Pharmacy where electronic prescriptions will be sent. If written prescriptions are taken to a different pharmacy, please inform the nursing staff. The pharmacy listed in the electronic medical record should be the one where you would like electronic prescriptions to be sent.  Prescription refills: Only during scheduled appointments. Applies to both, written and electronic prescriptions.  NOTE: The following applies primarily to controlled substances (Opioid* Pain Medications).   Patient's responsibilities: 1. Pain Pills: Bring all pain pills to every appointment (except for procedure appointments). 2. Pill Bottles: Bring pills in original pharmacy bottle. Always bring newest bottle. Bring bottle, even if empty. 3. Medication refills: You are responsible for knowing and keeping track of what medications you need refilled. The day before your appointment, write a list of all prescriptions that need to be refilled. Bring that list to your appointment and give it to the admitting nurse. Prescriptions will be written only during appointments. If you forget a medication, it will not be "Called in", "Faxed", or "electronically sent". You will need to get another appointment to get these prescribed. 4. Prescription Accuracy: You are responsible for carefully inspecting your prescriptions before leaving our office. Have the discharge nurse carefully go over each prescription with you, before taking them home. Make sure that your name is accurately spelled, that your address is correct. Check the name and dose of your medication to make sure it is accurate. Check the number of pills, and the written instructions to make sure they are clear and accurate. Make sure that you are given enough medication to  last until your next medication refill appointment. 5. Taking Medication: Take medication as prescribed. Never take more pills than instructed. Never take medication more frequently than prescribed. Taking less pills or less frequently is permitted and encouraged, when it comes to controlled substances (written prescriptions).  6. Inform other Doctors: Always inform, all of your healthcare providers, of all the medications you take. 7. Pain Medication from other Providers: You are not allowed to accept any additional pain medication from any other Doctor or Healthcare provider. There are two exceptions to this rule. (see below) In the event that you require additional pain medication, you are responsible for notifying us, as stated below. 8. Medication Agreement: You are responsible for carefully reading and following our Medication Agreement. This must be signed before receiving any prescriptions from our practice. Safely store a copy of your signed Agreement. Violations to the Agreement will result in no further prescriptions. (Additional copies of our Medication Agreement are available upon request.) 9. Laws, Rules, & Regulations: All patients are expected to follow all Federal and State Laws, Statutes, Rules, & Regulations. Ignorance of the Laws does not constitute a valid excuse. The use of any illegal substances is prohibited. 10. Adopted CDC guidelines & recommendations: Target dosing levels will be at or below 60 MME/day. Use of benzodiazepines** is not recommended.  Exceptions: There are only two exceptions to the rule of not receiving pain medications from other Healthcare Providers. 1. Exception #1 (Emergencies): In the event of an emergency (i.e.: accident requiring emergency care), you are allowed to receive additional pain medication. However, you are responsible for: As soon as you are able, call our office [phone], at any time of the day or night, and leave a message stating your  name, the date and nature of the emergency, and the name and dose of the medication   prescribed. In the event that your call is answered by a member of our staff, make sure to document and save the date, time, and the name of the person that took your information.  2. Exception #2 (Planned Surgery): In the event that you are scheduled by another doctor or dentist to have any type of surgery or procedure, you are allowed (for a period no longer than 30 days), to receive additional pain medication, for the acute post-op pain. However, in this case, you are responsible for picking up a copy of our "Post-op Pain Management for Surgeons" handout, and giving it to your surgeon or dentist. This document is available at our office, and does not require an appointment to obtain it. Simply go to our office during business hours (Monday-Thursday from 8:00 AM to 4:00 PM) (Friday 8:00 AM to 12:00 Noon) or if you have a scheduled appointment with [place], prior to your surgery, and ask for it by name. In addition, you will need to provide [place] with your name, name of your surgeon, type of surgery, and date of procedure or surgery.  *Opioid medications include: morphine, codeine, oxycodone, oxymorphone, hydrocodone, hydromorphone, meperidine, tramadol, tapentadol, buprenorphine, fentanyl, methadone. **Benzodiazepine medications include: diazepam (Valium), alprazolam (Xanax), clonazepam (Klonopine), lorazepam (Ativan), clorazepate (Tranxene), chlordiazepoxide (Librium), estazolam (Prosom), oxazepam (Serax), temazepam (Restoril), triazolam (Halcion)  ____________________________________________________________________________________________  BMI Assessment: Estimated body mass index is 41.3 kg/m as calculated from the following:   Height as of this encounter: 5' 5.5" (1.664 m).   Weight as of this encounter: 252 lb (114.3 kg).  BMI interpretation table: BMI level Category Range association with higher incidence of chronic  pain  <18 kg/m2 Underweight   18.5-24.9 kg/m2 Ideal body weight   25-29.9 kg/m2 Overweight Increased incidence by 20%  30-34.9 kg/m2 Obese (Class I) Increased incidence by 68%  35-39.9 kg/m2 Severe obesity (Class II) Increased incidence by 136%  >40 kg/m2 Extreme obesity (Class III) Increased incidence by 254%   BMI Readings from Last 4 Encounters:  11/18/16 41.30 kg/m  09/30/16 43.27 kg/m  09/25/16 43.27 kg/m  09/21/16 43.77 kg/m   Wt Readings from Last 4 Encounters:  11/18/16 252 lb (114.3 kg)  09/30/16 260 lb (117.9 kg)  09/25/16 260 lb (117.9 kg)  09/21/16 263 lb (119.3 kg)   You were given 3 prescriptions for Hydrocodone today. A prescription for Cymbalta and Neurontin was sent to your pharmacy.

## 2016-11-18 NOTE — Progress Notes (Signed)
Nursing Pain Medication Assessment:  Safety precautions to be maintained throughout the outpatient stay will include: orient to surroundings, keep bed in low position, maintain call bell within reach at all times, provide assistance with transfer out of bed and ambulation.  Medication Inspection Compliance: Pill count conducted under aseptic conditions, in front of the patient. Neither the pills nor the bottle was removed from the patient's sight at any time. Once count was completed pills were immediately returned to the patient in their original bottle.  Medication: Hydrocodone/APAP Pill/Patch Count: 77 of 90 pills remain Pill/Patch Appearance: Markings consistent with prescribed medication Bottle Appearance: Standard pharmacy container. Clearly labeled. Filled Date: 08 / 24 / 2018 Last Medication intake:  Yesterday

## 2016-11-21 ENCOUNTER — Other Ambulatory Visit: Payer: Self-pay | Admitting: Family Medicine

## 2016-11-21 DIAGNOSIS — F419 Anxiety disorder, unspecified: Secondary | ICD-10-CM

## 2016-11-21 DIAGNOSIS — F329 Major depressive disorder, single episode, unspecified: Secondary | ICD-10-CM

## 2016-11-24 NOTE — Telephone Encounter (Signed)
Please advise for refill, last OV was 09/21/16, thanks

## 2016-11-25 DIAGNOSIS — L97322 Non-pressure chronic ulcer of left ankle with fat layer exposed: Secondary | ICD-10-CM | POA: Diagnosis not present

## 2016-11-25 DIAGNOSIS — M65072 Abscess of tendon sheath, left ankle and foot: Secondary | ICD-10-CM | POA: Diagnosis not present

## 2016-11-25 DIAGNOSIS — E1143 Type 2 diabetes mellitus with diabetic autonomic (poly)neuropathy: Secondary | ICD-10-CM | POA: Diagnosis not present

## 2016-11-25 NOTE — Telephone Encounter (Signed)
Script faxed.

## 2016-11-27 ENCOUNTER — Ambulatory Visit (INDEPENDENT_AMBULATORY_CARE_PROVIDER_SITE_OTHER): Payer: PPO | Admitting: Family Medicine

## 2016-11-27 ENCOUNTER — Encounter: Payer: Self-pay | Admitting: Family Medicine

## 2016-11-27 DIAGNOSIS — Z96619 Presence of unspecified artificial shoulder joint: Secondary | ICD-10-CM | POA: Diagnosis not present

## 2016-11-27 DIAGNOSIS — E1142 Type 2 diabetes mellitus with diabetic polyneuropathy: Secondary | ICD-10-CM | POA: Diagnosis not present

## 2016-11-27 DIAGNOSIS — I480 Paroxysmal atrial fibrillation: Secondary | ICD-10-CM | POA: Diagnosis not present

## 2016-11-27 DIAGNOSIS — Z8614 Personal history of Methicillin resistant Staphylococcus aureus infection: Secondary | ICD-10-CM | POA: Diagnosis not present

## 2016-11-27 DIAGNOSIS — L02611 Cutaneous abscess of right foot: Secondary | ICD-10-CM | POA: Diagnosis not present

## 2016-11-27 DIAGNOSIS — L97423 Non-pressure chronic ulcer of left heel and midfoot with necrosis of muscle: Secondary | ICD-10-CM | POA: Diagnosis not present

## 2016-11-27 DIAGNOSIS — B9561 Methicillin susceptible Staphylococcus aureus infection as the cause of diseases classified elsewhere: Secondary | ICD-10-CM | POA: Diagnosis not present

## 2016-11-27 DIAGNOSIS — K219 Gastro-esophageal reflux disease without esophagitis: Secondary | ICD-10-CM

## 2016-11-27 DIAGNOSIS — F418 Other specified anxiety disorders: Secondary | ICD-10-CM | POA: Diagnosis not present

## 2016-11-27 DIAGNOSIS — F32A Depression, unspecified: Secondary | ICD-10-CM

## 2016-11-27 DIAGNOSIS — F419 Anxiety disorder, unspecified: Secondary | ICD-10-CM | POA: Diagnosis not present

## 2016-11-27 DIAGNOSIS — Z23 Encounter for immunization: Secondary | ICD-10-CM | POA: Diagnosis not present

## 2016-11-27 DIAGNOSIS — Z9981 Dependence on supplemental oxygen: Secondary | ICD-10-CM | POA: Diagnosis not present

## 2016-11-27 DIAGNOSIS — M129 Arthropathy, unspecified: Secondary | ICD-10-CM | POA: Diagnosis not present

## 2016-11-27 DIAGNOSIS — J45909 Unspecified asthma, uncomplicated: Secondary | ICD-10-CM | POA: Diagnosis not present

## 2016-11-27 DIAGNOSIS — R0602 Shortness of breath: Secondary | ICD-10-CM | POA: Diagnosis not present

## 2016-11-27 DIAGNOSIS — F329 Major depressive disorder, single episode, unspecified: Secondary | ICD-10-CM

## 2016-11-27 DIAGNOSIS — E11621 Type 2 diabetes mellitus with foot ulcer: Secondary | ICD-10-CM | POA: Diagnosis not present

## 2016-11-27 DIAGNOSIS — L03116 Cellulitis of left lower limb: Secondary | ICD-10-CM | POA: Diagnosis not present

## 2016-11-27 DIAGNOSIS — Z6841 Body Mass Index (BMI) 40.0 and over, adult: Secondary | ICD-10-CM | POA: Diagnosis not present

## 2016-11-27 DIAGNOSIS — E1151 Type 2 diabetes mellitus with diabetic peripheral angiopathy without gangrene: Secondary | ICD-10-CM | POA: Diagnosis not present

## 2016-11-27 DIAGNOSIS — Z794 Long term (current) use of insulin: Secondary | ICD-10-CM | POA: Diagnosis not present

## 2016-11-27 DIAGNOSIS — L0390 Cellulitis, unspecified: Secondary | ICD-10-CM | POA: Diagnosis not present

## 2016-11-27 DIAGNOSIS — Z7901 Long term (current) use of anticoagulants: Secondary | ICD-10-CM | POA: Diagnosis not present

## 2016-11-27 DIAGNOSIS — I10 Essential (primary) hypertension: Secondary | ICD-10-CM | POA: Diagnosis not present

## 2016-11-27 DIAGNOSIS — E069 Thyroiditis, unspecified: Secondary | ICD-10-CM | POA: Diagnosis not present

## 2016-11-27 MED ORDER — DEXLANSOPRAZOLE 30 MG PO CPDR: 60.0000 mg | DELAYED_RELEASE_CAPSULE | Freq: Every day | ORAL | 1 refills | Status: DC

## 2016-11-27 NOTE — Patient Instructions (Addendum)
Dexilant 60 mg daily.  Continue your other medications.  Best of luck  Take care  Dr. Lacinda Axon

## 2016-11-28 ENCOUNTER — Other Ambulatory Visit: Payer: Self-pay | Admitting: Family Medicine

## 2016-11-29 NOTE — Assessment & Plan Note (Signed)
Stable.  Continue metformin. 

## 2016-11-29 NOTE — Assessment & Plan Note (Signed)
Worsening. Increasing Dexilant. If persists will need EGD.

## 2016-11-29 NOTE — Progress Notes (Signed)
Subjective:  Patient ID: [name], female    DOB: September 24, 1950  Age: 66 y.o. MRN: [id number]  CC: Follow up  HPI:  66 year old female with an extensive PMH presents for follow up.  DM-2 with complications   Most recent A1C at goal (6.9).   Compliant with Metformin.   GERD  Reports continued symptoms.   Worse with certain foods.  Compliant with Dexilant.  Depression  Has recently been worsening (following wound and subsequent surgery/removal of a portion of her achilles).  She states that she has been down, and crying a lot.   Upset with lack of family help/involvement.   Compliant with Wellbutrin and Cymbalta.  Social Hx   Social History   Social History  . Marital status: Married    Spouse name: N/A  . Number of children: N/A  . Years of education: N/A   Social History Main Topics  . Smoking status: Never Smoker  . Smokeless tobacco: Never Used  . Alcohol use No  . Drug use: No  . Sexual activity: No   Other Topics Concern  . None   Social History Narrative   She is currently married -- for 28 years. Does not work. Does not smoke or take alcohol. She never smoked. She exercises at least 3 days a week since before her gastric surgery.   Marital status reviewed in history of present illness.    Review of Systems  Constitutional: Negative.   Gastrointestinal:       GERD.     Objective:  BP (!) 152/88 (BP Location: Left Arm, Patient Position: Sitting, Cuff Size: Large)   Pulse (!) 58   Temp 98.5 F (36.9 C) (Oral)   Resp 16   Wt 264 lb 4 oz (119.9 kg)   SpO2 94%   BMI 43.30 kg/m   BP/Weight 11/27/2016 11/18/2016 9/41/7408  Systolic BP 144 818 563  Diastolic BP 88 71 149  Wt. (Lbs) 264.25 252 260  BMI 43.3 41.3 43.27    Physical Exam  Constitutional: She is oriented to person, place, and time. She appears well-developed. No distress.  Cardiovascular: Normal rate and regular rhythm.   Pulmonary/Chest: Effort normal. She has no wheezes.  She has no rales.  Neurological: She is alert and oriented to person, place, and time.  Psychiatric:  Normal affect. Depressed mood.  Vitals reviewed.   Lab Results  Component Value Date   WBC 5.8 09/30/2016   HGB 11.8 (L) 09/30/2016   HCT 34.6 (L) 09/30/2016   PLT 261 09/30/2016   GLUCOSE 113 (H) 09/30/2016   CHOL 191 06/09/2016   TRIG 150.0 (H) 06/09/2016   HDL 81.40 06/09/2016   LDLCALC 79 06/09/2016   ALT 9 06/09/2016   AST 11 06/09/2016   NA 138 09/30/2016   K 5.1 09/30/2016   CL 101 09/30/2016   CREATININE 0.97 09/30/2016   BUN 16 09/30/2016   CO2 30 09/30/2016   TSH 1.06 06/09/2016   INR 1.18 09/26/2015   HGBA1C 6.9 (H) 06/09/2016    Assessment & Plan:   Problem List Items Addressed This Visit    DM (diabetes mellitus) type II, controlled, with peripheral vascular disorder ([place]) (Chronic)    Stable. Continue metformin.       Anxiety and depression    Depression has been worse as of late, but is slowly improving with improvement in recent wound. Continue current meds. Not interested in med change or addition.      Gastro-esophageal reflux  disease without esophagitis    Worsening. Increasing Dexilant. If persists will need EGD.      Relevant Medications   Dexlansoprazole 30 MG capsule    Other Visit Diagnoses    Need for pneumococcal vaccination       Relevant Orders   Pneumococcal conjugate vaccine 13-valent IM (Completed)      Meds ordered this encounter  Medications  . Dexlansoprazole 30 MG capsule    Sig: Take 2 capsules (60 mg total) by mouth daily.    Dispense:  180 capsule    Refill:  1     Follow-up: Return in about 3 months (around 02/26/2017).  [place]

## 2016-11-29 NOTE — Assessment & Plan Note (Signed)
Depression has been worse as of late, but is slowly improving with improvement in recent wound. Continue current meds. Not interested in med change or addition.

## 2016-12-02 NOTE — Progress Notes (Signed)
12/03/2016 3:44 PM   [name] [birth year] [id number]  Referring provider: [name], DO [address] [place], [place] [address]  Chief Complaint  Patient presents with  . Urinary Incontinence    1 year follow up  . Vaginitis    HPI: Patient is a 66 year old Caucasian female with a history of urinary incontinence, vaginal atrophy and history of renal stones who presents today for 1 year follow-up.  Urinary incontinence Patient has been on Myrbetriq 25 mg and oxybutynin XL 10 mg daily. Her PVR on today's exam is 0 mL.  She is not experiencing suprapubic pain, gross hematuria or dysuria.  The patient has been experiencing urgency x 4-7, frequency x 8 or more, is restricting fluids to avoid visits to the restroom, is engaging in toilet mapping, incontinence x 8 or more and nocturia x 0-3.  Her PVR is 0 mL.  she states recently she has noted that she has started to have her urge incontinence in the morning. She states she does not experience nocturia.  Today she states she is having some burning with urination. It started yesterday.  Her UA today is positive for greater than 30 WBCs greater than 30 RBCs per high-power field.    Vaginal atrophy She is using vaginal estrogen cream three nights weekly.   History of kidney stones She has not had any recent flank pain or gross hematuria.  A CT scan performed in 2014 did not note any nephrolithiasis.     PMH: Past Medical History:  Diagnosis Date  . Anxiety and depression   . Arthritis   . Asthma   . Cystocele   . Depression   . Diabetes ([place])   . DVT (deep venous thrombosis) (HCC)    Righ calf  . GERD (gastroesophageal reflux disease)   . Heart murmur    Echocardiogram June 2015: Mild MR (possible vegetation seen on TTE, not seen on TEE), normal LV size with moderate concentric LVH. Normal function EF 60-65%. Normal diastolic function. Mild LA dilation.  Marland Kitchen History of IBS   . History of methicillin resistant  staphylococcus aureus (MRSA) 2008  . Hypothyroidism   . Insomnia   . Kidney stone    kidney stones with lithotripsy  . Neuropathy involving both lower extremities   . Obesity, Class II, BMI 35-39.9, with comorbidity   . Paroxysmal atrial fibrillation ([place]) 2007   In June 2015, Cardiac Event Monitor: Mostly SR/sinus arrhythmia with PVCs that are frequent. Short bursts of A. fib lasting several minutes;; CHA2DS2-VASc Score = 5 (age, Female, PVD, DM, HTN)  . Peripheral vascular disease ([place])   . Sleep apnea    use C-PAP  . Venous stasis dermatitis of both lower extremities     Surgical History: Past Surgical History:  Procedure Laterality Date  . ANKLE SURGERY Right   . APPLICATION OF WOUND VAC Left 06/27/2015   Procedure: APPLICATION OF WOUND VAC ( POSSIBLE ) ;  Surgeon: [name], MD;  Location: [facility];  Service: Vascular;  Laterality: Left;  . APPLICATION OF WOUND VAC Left 09/25/2016   Procedure: APPLICATION OF WOUND VAC;  Surgeon: [name], DPM;  Location: [facility];  Service: Podiatry;  Laterality: Left;  . arm surgery     right    . CHOLECYSTECTOMY    . HERNIA REPAIR     umbilical  . HIATAL HERNIA REPAIR    . I&D EXTREMITY Left 06/27/2015   Procedure: IRRIGATION AND DEBRIDEMENT EXTREMITY            (  CALF HEMATOMA ) POSSIBLE WOUND VAC;  Surgeon: [name], MD;  Location: [facility];  Service: Vascular;  Laterality: Left;  . INCISION AND DRAINAGE OF WOUND Left 09/25/2016   Procedure: IRRIGATION AND DEBRIDEMENT - PARTIAL RESECTION OF ACHILLES TENDON WITH WOUND VAC APPLICATION;  Surgeon: [name], DPM;  Location: [facility];  Service: Podiatry;  Laterality: Left;  . IRRIGATION AND DEBRIDEMENT ABSCESS Left 09/07/2016   Procedure: IRRIGATION AND DEBRIDEMENT ABSCESS LEFT HEEL;  Surgeon: [name], DPM;  Location: [facility];  Service: Podiatry;  Laterality: Left;  . JOINT REPLACEMENT     left knee replacement  . KIDNEY SURGERY Right    kidney stones  . LITHOTRIPSY      . NM [facility] HX)  February 2017   Likely breast attenuation. LOW RISK study. Normal EF 55-60%.  . removal of left hematoma Left    leg  . REVERSE SHOULDER ARTHROPLASTY Right 10/08/2015   Procedure: REVERSE SHOULDER ARTHROPLASTY;  Surgeon: [name], MD;  Location: [facility];  Service: Orthopedics;  Laterality: Right;  . SLEEVE GASTROPLASTY    . TONSILLECTOMY    . TOTAL KNEE ARTHROPLASTY    . TRANSESOPHAGEAL ECHOCARDIOGRAM  08/08/2013   Mild LVH, EF 60-65%. Moderate LA dilation and mild RA dilation. Mild MR with no evidence of stenosis and no evidence of endocarditis. A false chordae is noted.  . TRANSTHORACIC ECHOCARDIOGRAM  08/03/2013   Mild-Moderate concentric LVH, EF 60-65%. Normal diastolic function. Mild LA dilation. Mild MR with possible vegetation  - not confirmed on TEE   . ULNAR NERVE TRANSPOSITION Right 08/06/2016   Procedure: ULNAR NERVE DECOMPRESSION/TRANSPOSITION;  Surgeon: [name], MD;  Location: [facility];  Service: Orthopedics;  Laterality: Right;  Marland Kitchen VAGINAL HYSTERECTOMY      Home Medications:  Allergies as of 12/03/2016      Reactions   Penicillins Hives, Shortness Of Breath, Swelling   Facial swelling Has patient had a PCN reaction causing immediate rash, facial/tongue/throat swelling, SOB or lightheadedness with hypotension: Yes Has patient had a PCN reaction causing severe rash involving mucus membranes or skin necrosis: Yes Has patient had a PCN reaction that required hospitalization No Has patient had a PCN reaction occurring within the last 10 years: No If all of the above answers are "NO", then may proceed with Cephalosporin use.   Aspirin Hives   Terramycin [oxytetracycline] Hives      Medication List       Accurate as of 12/03/16  3:44 PM. Always use your most recent med list.          albuterol (2.5 MG/3ML) 0.083% nebulizer solution Commonly known as:  PROVENTIL Take 3 mLs (2.5 mg total) by nebulization every 6 (six) hours as  needed for wheezing or shortness of breath.   albuterol 108 (90 Base) MCG/ACT inhaler Commonly known as:  PROVENTIL HFA;VENTOLIN HFA Inhale 1-2 puffs into the lungs every 4 (four) hours as needed for wheezing or shortness of breath.   ALPRAZolam 0.5 MG tablet Commonly known as:  XANAX TAKE 1 TABLET BY MOUTH TWICE DAILY AS NEEDED   buPROPion 300 MG 24 hr tablet Commonly known as:  WELLBUTRIN XL TAKE ONE TABLET BY MOUTH EVERY DAY   calcium carbonate 1500 (600 Ca) MG Tabs tablet Commonly known as:  OSCAL Take 600 mg of elemental calcium by mouth 2 (two) times daily with a meal.   cetirizine 10 MG tablet Commonly known as:  ZYRTEC cetirizine 10 mg tablet  TAKE 1  TABLET ONCE A DAY ORALLY   cyanocobalamin 500 MCG tablet Take 500 mcg by mouth daily.   dabigatran 150 MG Caps capsule Commonly known as:  PRADAXA TAKE 1 CAPSULE BY MOUTH TWICE DAILY   Dexlansoprazole 30 MG capsule Take 2 capsules (60 mg total) by mouth daily.   DULoxetine 60 MG capsule Commonly known as:  CYMBALTA Take 1 capsule (60 mg total) by mouth daily. Start taking on:  12/13/2016   estradiol 0.1 MG/GM vaginal cream Commonly known as:  ESTRACE Place 1 Applicatorful vaginally 3 (three) times a week.   gabapentin 800 MG tablet Commonly known as:  NEURONTIN Take 1 tablet (800 mg total) by mouth 4 (four) times daily. Start taking on:  12/13/2016   HYDROcodone-acetaminophen 5-325 MG tablet Commonly known as:  NORCO/VICODIN Take 1 tablet by mouth every 8 (eight) hours as needed for severe pain. Start taking on:  12/13/2016   HYDROcodone-acetaminophen 5-325 MG tablet Commonly known as:  NORCO/VICODIN Take 1 tablet by mouth every 8 (eight) hours as needed for severe pain. Start taking on:  01/12/2017   HYDROcodone-acetaminophen 5-325 MG tablet Commonly known as:  NORCO/VICODIN Take 1 tablet by mouth every 8 (eight) hours as needed for severe pain. Start taking on:  02/11/2017   hydrocortisone cream 1  % Apply 1 application topically daily as needed for itching.   levocetirizine 5 MG tablet Commonly known as:  XYZAL Take 1 tablet (5 mg total) by mouth every evening.   levothyroxine 150 MCG tablet Commonly known as:  SYNTHROID, LEVOTHROID TAKE ONE TABLET BY MOUTH EVERY MORNING   meloxicam 15 MG tablet Commonly known as:  MOBIC Take 15 mg by mouth daily.   metFORMIN 1000 MG tablet Commonly known as:  GLUCOPHAGE TAKE ONE TABLET BY MOUTH TWICE DAILY   metoprolol tartrate 25 MG tablet Commonly known as:  LOPRESSOR TAKE ONE TABLET TWICE DAILY   mirabegron ER 25 MG Tb24 tablet Commonly known as:  MYRBETRIQ Take 1 tablet (25 mg total) by mouth daily.   montelukast 10 MG tablet Commonly known as:  SINGULAIR TAKE ONE TABLET BY MOUTH EVERY DAY   multivitamin with minerals Tabs tablet Take 1 tablet by mouth daily.   nitrofurantoin (macrocrystal-monohydrate) 100 MG capsule Commonly known as:  MACROBID Take 1 capsule (100 mg total) by mouth every 12 (twelve) hours.   oxybutynin 15 MG 24 hr tablet Commonly known as:  DITROPAN XL Take 1 tablet (15 mg total) by mouth at bedtime.   quinapril 20 MG tablet Commonly known as:  [place] ONE TABLET EVERY DAY            Discharge Care Instructions        Start     Ordered   12/04/16 0000  estradiol (ESTRACE) 0.1 MG/GM vaginal cream  3 times weekly    Question:  Supervising Provider  Answer:  Hollice Espy   12/03/16 1540   12/03/16 0000  BLADDER SCAN AMB NON-IMAGING     12/03/16 1448   12/03/16 0000  Urinalysis, Complete     12/03/16 1512   12/03/16 0000  oxybutynin (DITROPAN XL) 15 MG 24 hr tablet  Daily at bedtime    Question:  Supervising Provider  Answer:  Hollice Espy   12/03/16 1540   12/03/16 0000  CULTURE, URINE COMPREHENSIVE     12/03/16 1541   12/03/16 0000  nitrofurantoin, macrocrystal-monohydrate, (MACROBID) 100 MG capsule  Every 12 hours    Question:  Supervising Provider  Answer:  Hollice Espy  12/03/16 1543      Allergies:  Allergies  Allergen Reactions  . Penicillins Hives, Shortness Of Breath and Swelling    Facial swelling Has patient had a PCN reaction causing immediate rash, facial/tongue/throat swelling, SOB or lightheadedness with hypotension: Yes Has patient had a PCN reaction causing severe rash involving mucus membranes or skin necrosis: Yes Has patient had a PCN reaction that required hospitalization No Has patient had a PCN reaction occurring within the last 10 years: No If all of the above answers are "NO", then may proceed with Cephalosporin use.   . Aspirin Hives  . Terramycin [Oxytetracycline] Hives    Family History: Family History  Problem Relation Age of Onset  . Skin cancer Father   . Diabetes Father   . Hypertension Father   . Peripheral vascular disease Father   . Cancer Father   . Cerebral aneurysm Father   . Alcohol abuse Father   . Varicose Veins Mother   . Kidney disease Mother   . Arthritis Mother   . Mental illness Sister   . Cancer Maternal Aunt        breast  . Breast cancer Maternal Aunt   . Arthritis Maternal Grandmother   . Hypertension Maternal Grandmother   . Diabetes Maternal Grandmother   . Arthritis Maternal Grandfather   . Heart disease Maternal Grandfather   . Hypertension Maternal Grandfather   . Arthritis Paternal Grandmother   . Hypertension Paternal Grandmother   . Diabetes Paternal Grandmother   . Arthritis Paternal Grandfather   . Hypertension Paternal Grandfather   . Bladder Cancer Neg Hx   . Kidney cancer Neg Hx     Social History:  reports that she has never smoked. She has never used smokeless tobacco. She reports that she does not drink alcohol or use drugs.  ROS: UROLOGY Frequent Urination?: No Hard to postpone urination?: No Burning/pain with urination?: Yes Get up at night to urinate?: No Leakage of urine?: Yes Urine stream starts and stops?: No Trouble starting stream?: No Do you have to  strain to urinate?: No Blood in urine?: No Urinary tract infection?: No Sexually transmitted disease?: No Injury to kidneys or bladder?: No Painful intercourse?: No Weak stream?: No Currently pregnant?: No Vaginal bleeding?: No Last menstrual period?: n  Gastrointestinal Nausea?: No Vomiting?: No Indigestion/heartburn?: Yes Diarrhea?: No Constipation?: No  Constitutional Fever: No Night sweats?: No Weight loss?: No Fatigue?: No  Skin Skin rash/lesions?: No Itching?: No  Eyes Blurred vision?: No Double vision?: No  Ears/Nose/Throat Sore throat?: No Sinus problems?: Yes  Hematologic/Lymphatic Swollen glands?: No Easy bruising?: No  Cardiovascular Leg swelling?: Yes Chest pain?: No  Respiratory Cough?: No Shortness of breath?: No  Endocrine Excessive thirst?: No  Musculoskeletal Back pain?: No Joint pain?: Yes  Neurological Headaches?: No Dizziness?: No  Psychologic Depression?: No Anxiety?: Yes  Physical Exam: BP (!) 180/73   Pulse 61   Ht 5' 5.5" (1.664 m)   Wt 264 lb 9.6 oz (120 kg)   BMI 43.36 kg/m   Constitutional: Well nourished. Alert and oriented, No acute distress. HEENT: [place] AT, moist mucus membranes. Trachea midline, no masses. Cardiovascular: No clubbing, cyanosis, or edema. Respiratory: Normal respiratory effort, no increased work of breathing. GI: Abdomen is soft, non tender, non distended, no abdominal masses. Liver and spleen not palpable.  No hernias appreciated.  Stool sample for occult testing is not indicated.   GU: No CVA tenderness.  No bladder fullness or masses.  Atrophic external genitalia,  normal pubic hair distribution, no lesions.  Normal urethral meatus, no lesions, no prolapse, no discharge.   No urethral masses, tenderness and/or tenderness. No bladder fullness, tenderness or masses. Normal vagina mucosa, good estrogen effect, no discharge, no lesions, good pelvic support, no cystocele or rectocele noted.  Cervix,  uterus and adnexa are surgically absent.  Anus and perineum are without rashes or lesions.    Skin: No rashes, bruises or suspicious lesions. Lymph: No cervical or inguinal adenopathy. Neurologic: Grossly intact, no focal deficits, moving all 4 extremities. Psychiatric: Normal mood and affect.   Laboratory Data: Results for orders placed or performed in visit on 12/03/16  BLADDER SCAN AMB NON-IMAGING  Result Value Ref Range   Scan Result 0    I have reviewed the labs  Assessment & Plan:    1. Mixed urinary incontinence  - BLADDER SCAN AMB NON-IMAGING  - continue Myrbetriq  25 mg daily, refills given  - increase oxybutynin XL 15 mg daily, script given  2. Atrophic vaginitis  - continue vaginal estrogen cream three nights weekly  3. Dysuria  -UA is suspicious for infection  - Will send for culture - we will try ascribe an antibiotic at this time as patient is symptomatic and we have adequate whether pending and I'm not certain when we'll be able to obtain urine culture results  - Macrobid send to pharmacy   Zara Council, [facility] [address], [place], [place] [address] [phone]

## 2016-12-03 ENCOUNTER — Ambulatory Visit: Payer: PPO | Admitting: Urology

## 2016-12-03 ENCOUNTER — Encounter: Payer: Self-pay | Admitting: Urology

## 2016-12-03 VITALS — BP 180/73 | HR 61 | Ht 65.5 in | Wt 264.6 lb

## 2016-12-03 DIAGNOSIS — R300 Dysuria: Secondary | ICD-10-CM

## 2016-12-03 DIAGNOSIS — N3946 Mixed incontinence: Secondary | ICD-10-CM | POA: Diagnosis not present

## 2016-12-03 DIAGNOSIS — N952 Postmenopausal atrophic vaginitis: Secondary | ICD-10-CM

## 2016-12-03 LAB — BLADDER SCAN AMB NON-IMAGING: Scan Result: 0

## 2016-12-03 MED ORDER — OXYBUTYNIN CHLORIDE ER 15 MG PO TB24: 15.0000 mg | ORAL_TABLET | Freq: Every day | ORAL | 3 refills | Status: DC

## 2016-12-03 MED ORDER — MIRABEGRON ER 25 MG PO TB24: 25.0000 mg | ORAL_TABLET | Freq: Every day | ORAL | 12 refills | Status: DC

## 2016-12-03 MED ORDER — NITROFURANTOIN MONOHYD MACRO 100 MG PO CAPS: 100.0000 mg | ORAL_CAPSULE | Freq: Two times a day (BID) | ORAL | 0 refills | Status: DC

## 2016-12-03 MED ORDER — ESTRADIOL 0.1 MG/GM VA CREA: 1.0000 | TOPICAL_CREAM | VAGINAL | 12 refills | Status: DC

## 2016-12-04 DIAGNOSIS — G4733 Obstructive sleep apnea (adult) (pediatric): Secondary | ICD-10-CM | POA: Diagnosis not present

## 2016-12-04 LAB — URINALYSIS, COMPLETE
Bilirubin, UA: NEGATIVE
GLUCOSE, UA: NEGATIVE
NITRITE UA: NEGATIVE
Specific Gravity, UA: >1.03 — ABNORMAL HIGH (ref 1.005–1.030)
UUROB: 0.2 mg/dL (ref 0.2–1.0)
pH, UA: 5.5 (ref 5.0–7.5)

## 2016-12-04 LAB — MICROSCOPIC EXAMINATION

## 2016-12-07 ENCOUNTER — Telehealth: Payer: Self-pay | Admitting: Urology

## 2016-12-07 ENCOUNTER — Telehealth: Payer: Self-pay

## 2016-12-07 DIAGNOSIS — N390 Urinary tract infection, site not specified: Secondary | ICD-10-CM

## 2016-12-07 LAB — CULTURE, URINE COMPREHENSIVE

## 2016-12-07 MED ORDER — SULFAMETHOXAZOLE-TRIMETHOPRIM 800-160 MG PO TABS: 1.0000 | ORAL_TABLET | Freq: Two times a day (BID) | ORAL | 0 refills | Status: AC

## 2016-12-07 NOTE — Telephone Encounter (Signed)
Patient called the office today to request diflucan to be sent to the [facility] pharmacy.   She has a yeast infection as a result of taking the antibiotics that were prescribed.  She says that it happens every time that she takes an antibiotic.

## 2016-12-07 NOTE — Telephone Encounter (Signed)
It is fine to send in a script for Diflucan 150 mg one tablet.

## 2016-12-07 NOTE — Telephone Encounter (Signed)
-----   Message from Nori Riis, PA-C sent at 12/07/2016 12:22 PM EDT ----- Please let Mrs. Iannello know that she has an UTI.  She needs to start Septra DS bid x seven days.

## 2016-12-07 NOTE — Telephone Encounter (Signed)
See previous note

## 2016-12-07 NOTE — Telephone Encounter (Signed)
Spoke with pt in reference to +ucx and needing abx. Made aware abx were sent to pharmacy. Pt requested diflucan with abx. Pt stated that she has a horrible yeast infection in her groin area. Pt stated she has used nystatin cream and powder all weekend and now has open sores.

## 2016-12-08 DIAGNOSIS — I10 Essential (primary) hypertension: Secondary | ICD-10-CM | POA: Diagnosis not present

## 2016-12-08 DIAGNOSIS — J45909 Unspecified asthma, uncomplicated: Secondary | ICD-10-CM | POA: Diagnosis not present

## 2016-12-08 DIAGNOSIS — Z96619 Presence of unspecified artificial shoulder joint: Secondary | ICD-10-CM | POA: Diagnosis not present

## 2016-12-08 DIAGNOSIS — M129 Arthropathy, unspecified: Secondary | ICD-10-CM | POA: Diagnosis not present

## 2016-12-08 DIAGNOSIS — E069 Thyroiditis, unspecified: Secondary | ICD-10-CM | POA: Diagnosis not present

## 2016-12-08 DIAGNOSIS — R0602 Shortness of breath: Secondary | ICD-10-CM | POA: Diagnosis not present

## 2016-12-08 MED ORDER — FLUCONAZOLE 150 MG PO TABS: 150.0000 mg | ORAL_TABLET | Freq: Once | ORAL | 0 refills | Status: AC

## 2016-12-08 NOTE — Telephone Encounter (Signed)
Made pt aware diflucan was sent to pharmacy. Pt voiced understanding.

## 2016-12-09 DIAGNOSIS — M65072 Abscess of tendon sheath, left ankle and foot: Secondary | ICD-10-CM | POA: Diagnosis not present

## 2016-12-09 DIAGNOSIS — L97322 Non-pressure chronic ulcer of left ankle with fat layer exposed: Secondary | ICD-10-CM | POA: Diagnosis not present

## 2016-12-09 DIAGNOSIS — E1143 Type 2 diabetes mellitus with diabetic autonomic (poly)neuropathy: Secondary | ICD-10-CM | POA: Diagnosis not present

## 2016-12-23 DIAGNOSIS — E1143 Type 2 diabetes mellitus with diabetic autonomic (poly)neuropathy: Secondary | ICD-10-CM | POA: Diagnosis not present

## 2016-12-23 DIAGNOSIS — L97322 Non-pressure chronic ulcer of left ankle with fat layer exposed: Secondary | ICD-10-CM | POA: Diagnosis not present

## 2016-12-23 DIAGNOSIS — M65072 Abscess of tendon sheath, left ankle and foot: Secondary | ICD-10-CM | POA: Diagnosis not present

## 2016-12-27 DIAGNOSIS — I10 Essential (primary) hypertension: Secondary | ICD-10-CM | POA: Diagnosis not present

## 2016-12-27 DIAGNOSIS — L0390 Cellulitis, unspecified: Secondary | ICD-10-CM | POA: Diagnosis not present

## 2016-12-27 DIAGNOSIS — L02611 Cutaneous abscess of right foot: Secondary | ICD-10-CM | POA: Diagnosis not present

## 2016-12-27 DIAGNOSIS — Z96619 Presence of unspecified artificial shoulder joint: Secondary | ICD-10-CM | POA: Diagnosis not present

## 2016-12-27 DIAGNOSIS — J45909 Unspecified asthma, uncomplicated: Secondary | ICD-10-CM | POA: Diagnosis not present

## 2016-12-27 DIAGNOSIS — R0602 Shortness of breath: Secondary | ICD-10-CM | POA: Diagnosis not present

## 2016-12-27 DIAGNOSIS — R0609 Other forms of dyspnea: Secondary | ICD-10-CM | POA: Diagnosis not present

## 2016-12-27 DIAGNOSIS — E069 Thyroiditis, unspecified: Secondary | ICD-10-CM | POA: Diagnosis not present

## 2016-12-27 DIAGNOSIS — M129 Arthropathy, unspecified: Secondary | ICD-10-CM | POA: Diagnosis not present

## 2016-12-27 IMAGING — RF DG UGI W/O KUB
14 of 15 series · 14 of 15 positions shown · non-contrast
Comparison: Upper GI series of November 23, 2012

CLINICAL DATA: Intermittent dysphagia as well as heartburn and
reflux, history of bariatric surgery in November 2012 with hiatal
hernia repair at that time.

EXAM:
UPPER GI SERIES WITHOUT KUB
TECHNIQUE: Routine upper GI series was performed with thin/high density/water
soluble barium. Effervescent crystals and a 13 mm barium tablet were
administered.
FLUOROSCOPY TIME:  Fluoroscopy Time (in minutes and seconds): 1
minutes, 12 seconds
Number of Acquired Images:  15

[Series 1: fluoro_barium singleshot_bw · 0.17mm/px · 1 of 1 slices shown (1 of 14)]
[im 1/1]
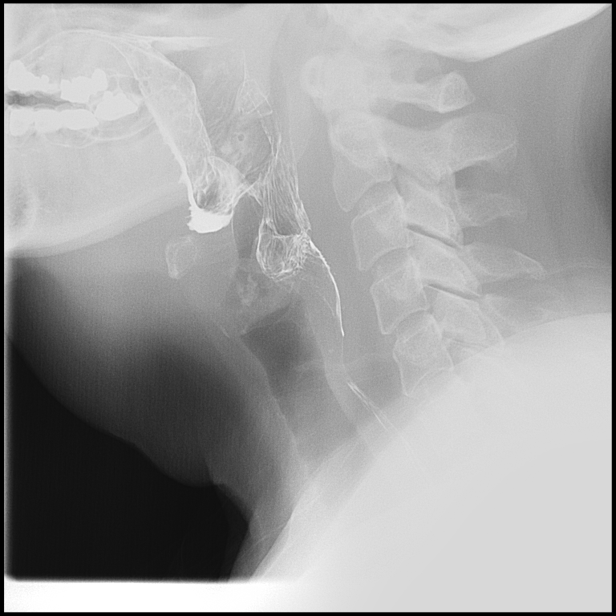

[Series 2: fluoro_barium singleshot_bw · 0.17mm/px · 1 of 1 slices shown (2 of 14)]
[im 1/1]
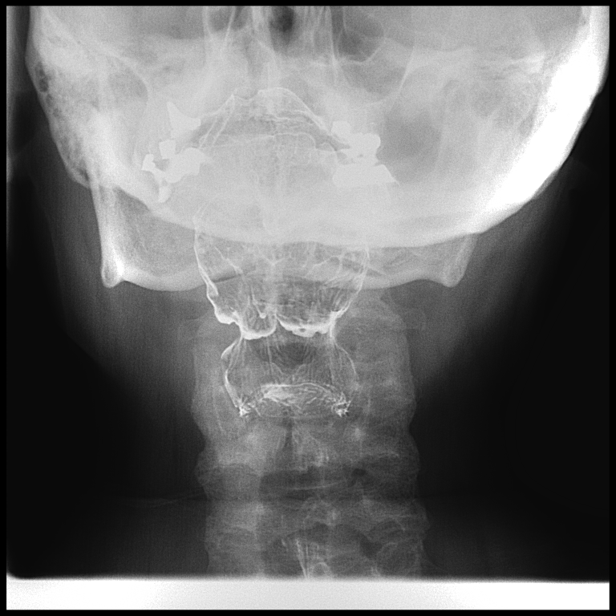

[Series 3: fluoro_barium singleshot_bw · 0.17mm/px · 1 of 1 slices shown (3 of 14)]
[im 1/1]
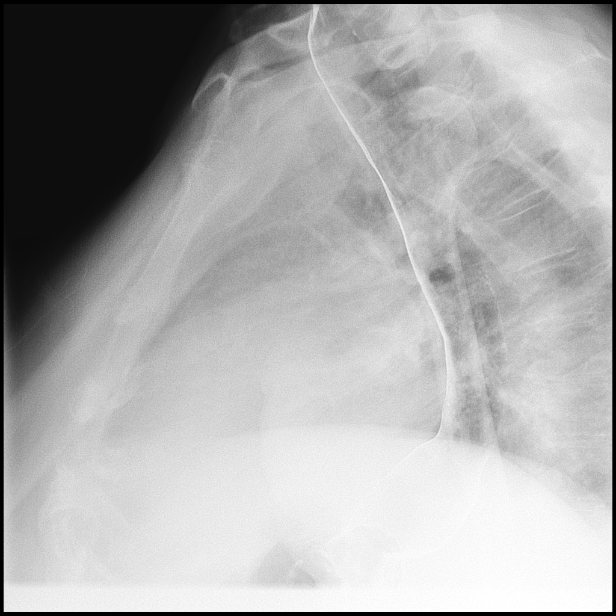

[Series 4: fluoro_barium singleshot_bw · 0.17mm/px · 1 of 1 slices shown (4 of 14)]
[im 1/1]
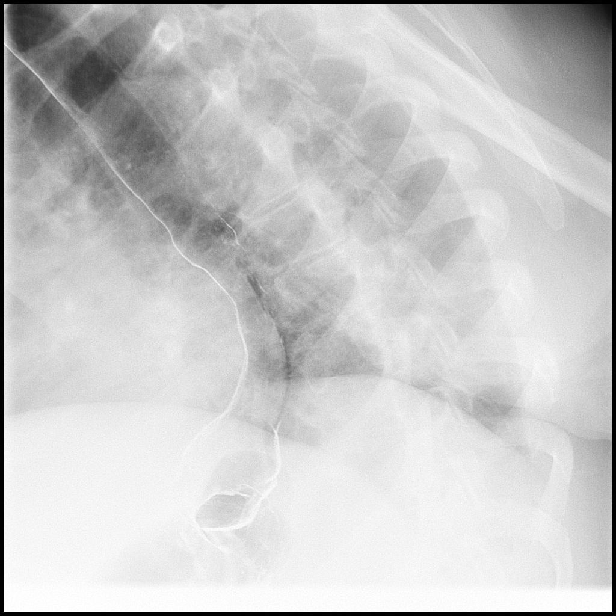

[Series 5: fluoro_barium singleshot_bw · 0.17mm/px · 1 of 1 slices shown (5 of 14)]
[im 1/1]
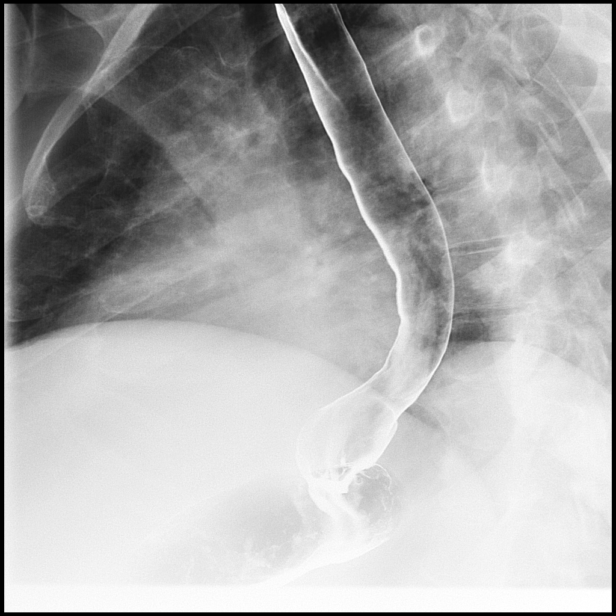

[Series 6: fluoro_barium singleshot_bw · 0.17mm/px · 1 of 1 slices shown (6 of 14)]
[im 1/1]
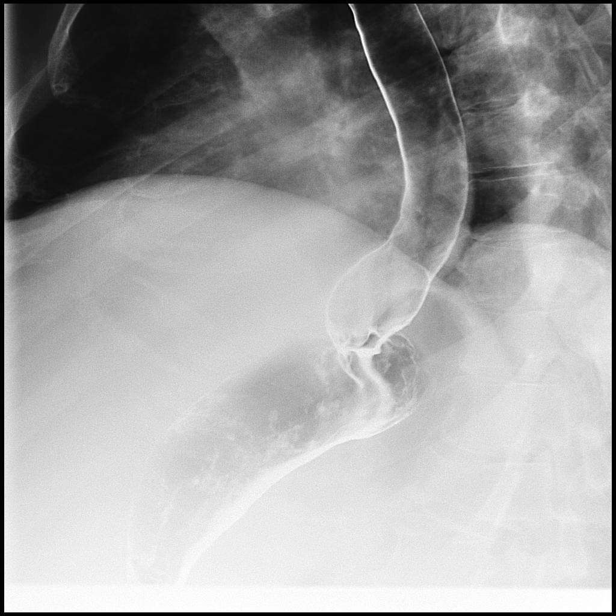

[Series 7: fluoro_barium singleshot_bw · 0.17mm/px · 1 of 1 slices shown (7 of 14)]
[im 1/1]
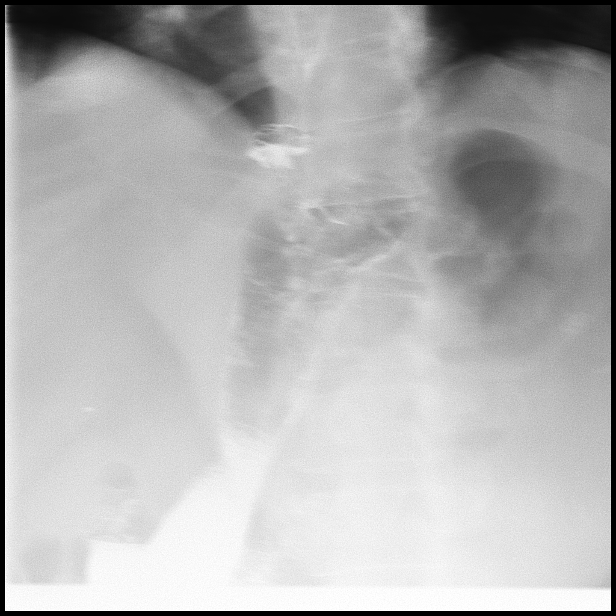

[Series 9: fluoro_barium singleshot_bw · 0.19mm/px · 1 of 1 slices shown (8 of 14)]
[im 1/1]
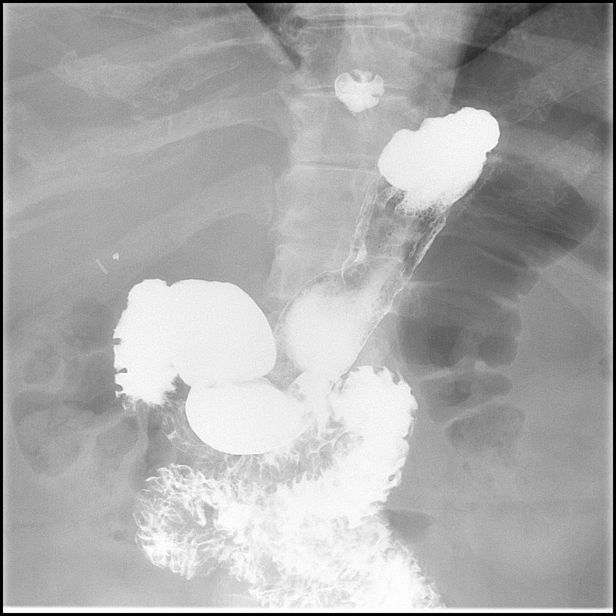

[Series 10: fluoro_barium singleshot_bw · 0.19mm/px · 1 of 1 slices shown (9 of 14)]
[im 1/1]
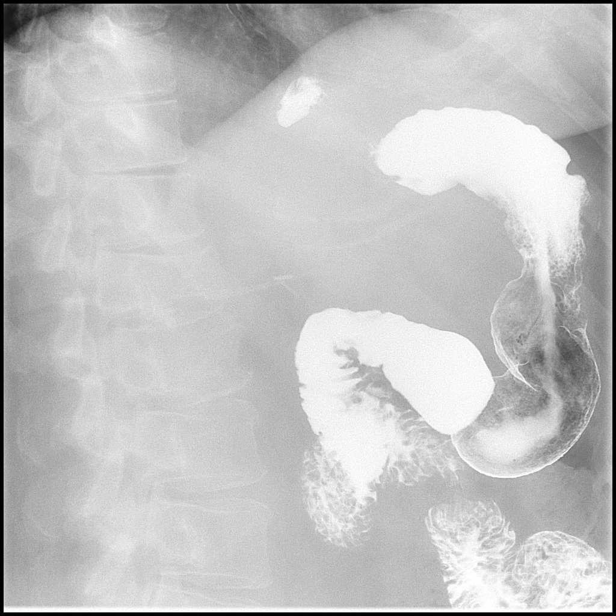

[Series 11: fluoro_barium singleshot_bw · 0.19mm/px · 1 of 1 slices shown (10 of 14)]
[im 1/1]
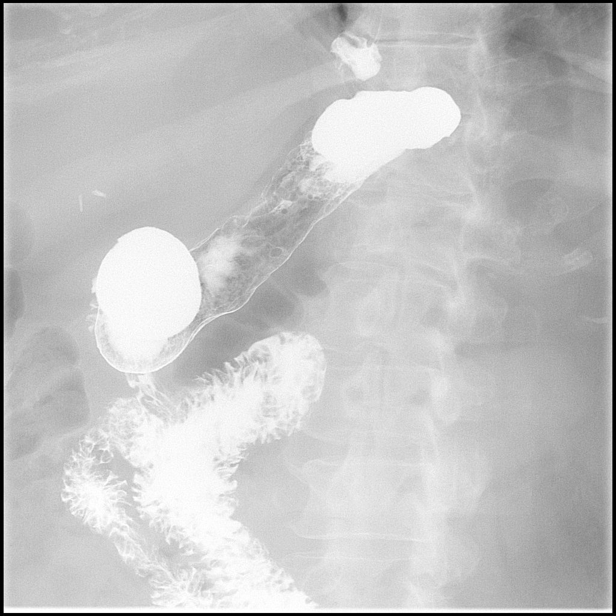

[Series 12: fluoro_barium singleshot_bw · 0.18mm/px · 1 of 1 slices shown (11 of 14)]
[im 1/1]
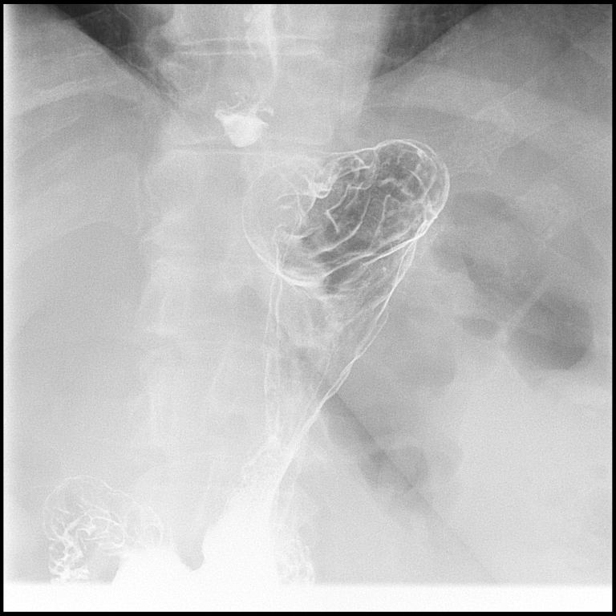

[Series 13: fluoro_barium singleshot_bw · 0.19mm/px · 1 of 1 slices shown (12 of 14)]
[im 1/1]
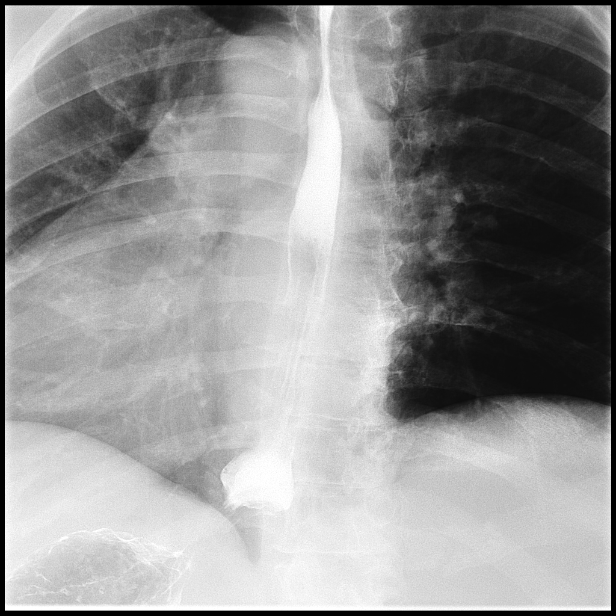

[Series 14: fluoro_barium singleshot_bw · 0.19mm/px · 1 of 1 slices shown (13 of 14)]
[im 1/1]
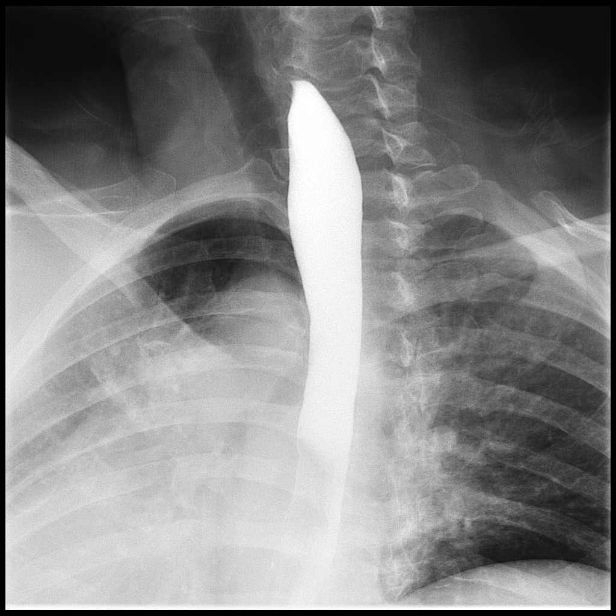

[Series 15: fluoro_barium singleshot_bw · 0.19mm/px · 1 of 1 slices shown (14 of 14)]
[im 1/1]
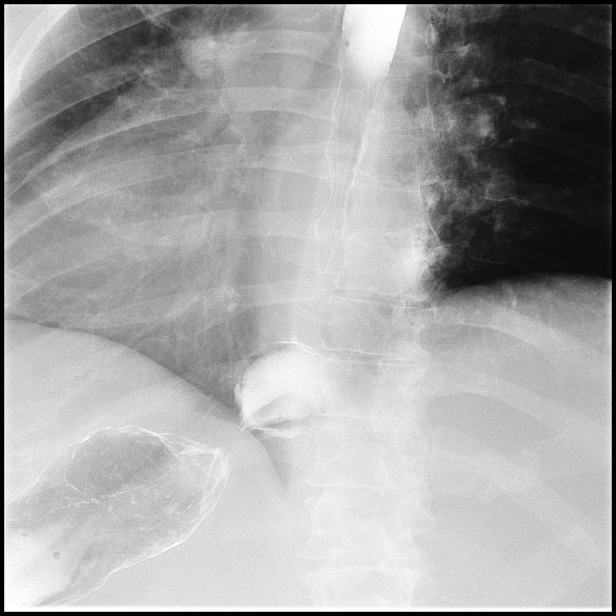

[14 of 15 positions shown; findings below may reference images not displayed]

FINDINGS: The patient ingested the thick and thin barium and gas forming
crystals without difficulty. The cervical esophagus distended well.
There was no laryngeal penetration of the barium. There was a
prominent cricopharyngeus muscle impression. The thoracic esophagus
distended well. There was mild stasis of the barium in the esophagus
in the prone position. A moderate-sized reducible hiatal hernia was
observed. No reflux was demonstrated. There was no fixed stricture
nor evidence of esophagitis.

The patient was unable to retain the gas created by the effervescent
crystals. The stomach exhibited a normal appearance allowing for
limited distention. No ulcer niche was observed. Gastric emptying
was prompt. The duodenal bulb and C sweep were normal. The barium
tablet passed promptly from the mouth to the stomach.
IMPRESSION: 1. Mild changes of presbyesophagus. Prominent cricopharyngeus muscle
consistent with chronic reflux though no reflux was observed today.
There is no evidence of esophagitis.
2. Moderate-sized reducible hiatal hernia without evidence of
mucosal irregularity.
3. The stomach and duodenum are grossly normal given the limited
distention.

## 2016-12-28 DIAGNOSIS — I10 Essential (primary) hypertension: Secondary | ICD-10-CM | POA: Diagnosis not present

## 2016-12-28 DIAGNOSIS — E11621 Type 2 diabetes mellitus with foot ulcer: Secondary | ICD-10-CM | POA: Diagnosis not present

## 2016-12-28 DIAGNOSIS — I480 Paroxysmal atrial fibrillation: Secondary | ICD-10-CM | POA: Diagnosis not present

## 2016-12-28 DIAGNOSIS — L03116 Cellulitis of left lower limb: Secondary | ICD-10-CM | POA: Diagnosis not present

## 2016-12-28 DIAGNOSIS — L97423 Non-pressure chronic ulcer of left heel and midfoot with necrosis of muscle: Secondary | ICD-10-CM | POA: Diagnosis not present

## 2016-12-28 DIAGNOSIS — E1142 Type 2 diabetes mellitus with diabetic polyneuropathy: Secondary | ICD-10-CM | POA: Diagnosis not present

## 2016-12-28 DIAGNOSIS — B9561 Methicillin susceptible Staphylococcus aureus infection as the cause of diseases classified elsewhere: Secondary | ICD-10-CM | POA: Diagnosis not present

## 2016-12-28 DIAGNOSIS — F418 Other specified anxiety disorders: Secondary | ICD-10-CM | POA: Diagnosis not present

## 2016-12-28 DIAGNOSIS — E1151 Type 2 diabetes mellitus with diabetic peripheral angiopathy without gangrene: Secondary | ICD-10-CM | POA: Diagnosis not present

## 2016-12-28 DIAGNOSIS — J45909 Unspecified asthma, uncomplicated: Secondary | ICD-10-CM | POA: Diagnosis not present

## 2017-01-01 ENCOUNTER — Other Ambulatory Visit: Payer: Self-pay | Admitting: Family Medicine

## 2017-01-01 DIAGNOSIS — F419 Anxiety disorder, unspecified: Secondary | ICD-10-CM

## 2017-01-01 DIAGNOSIS — F329 Major depressive disorder, single episode, unspecified: Secondary | ICD-10-CM

## 2017-01-01 NOTE — Telephone Encounter (Signed)
Consistently filled by Dr. Lacinda Axon previously. Refill will be given.

## 2017-01-01 NOTE — Telephone Encounter (Signed)
Last OV  11/27/2016 Next OV 01/11/2017 Last refill 11/25/2016

## 2017-01-03 IMAGING — MG MM DIGITAL SCREENING BILAT W/ CAD
8 series · 8 of 8 positions shown · non-contrast
Comparison: Previous exam(s).

CLINICAL DATA: Screening.

EXAM:
DIGITAL SCREENING BILATERAL MAMMOGRAM WITH CAD

[L CC (1 of 2)]
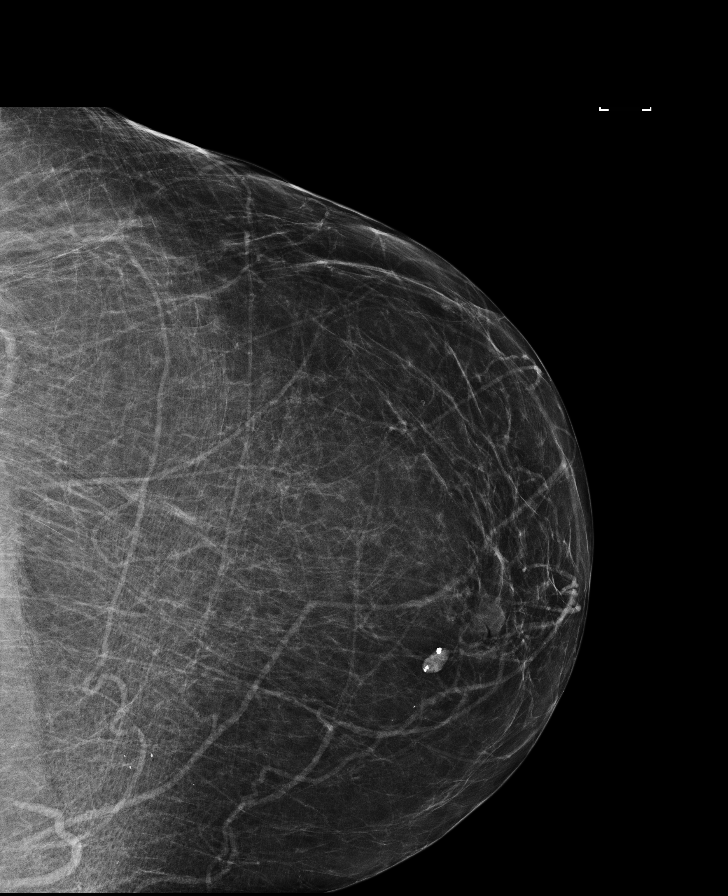

[R XCCL]
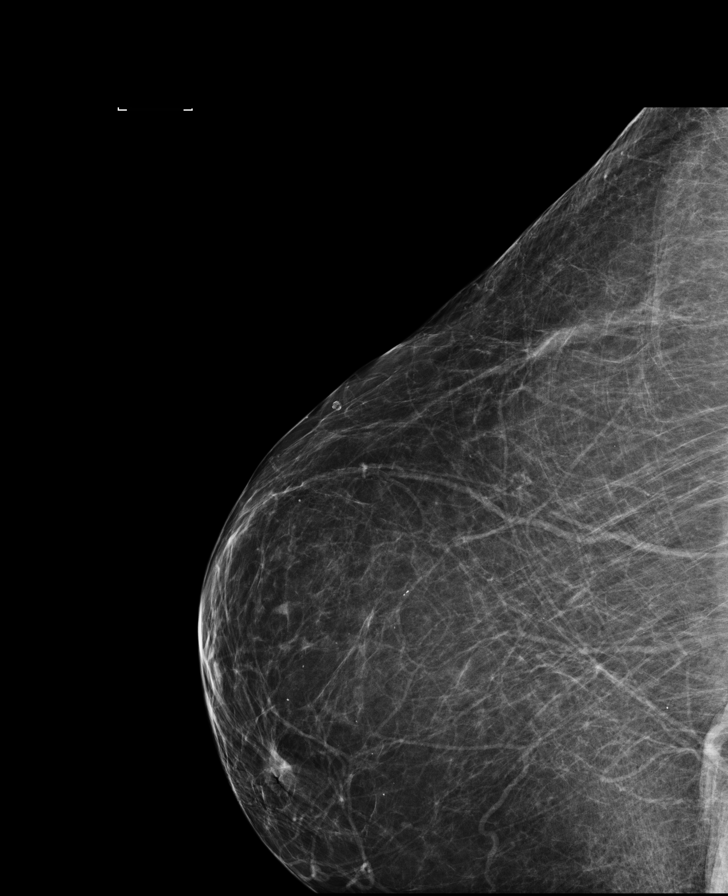

[L CC (2 of 2)]
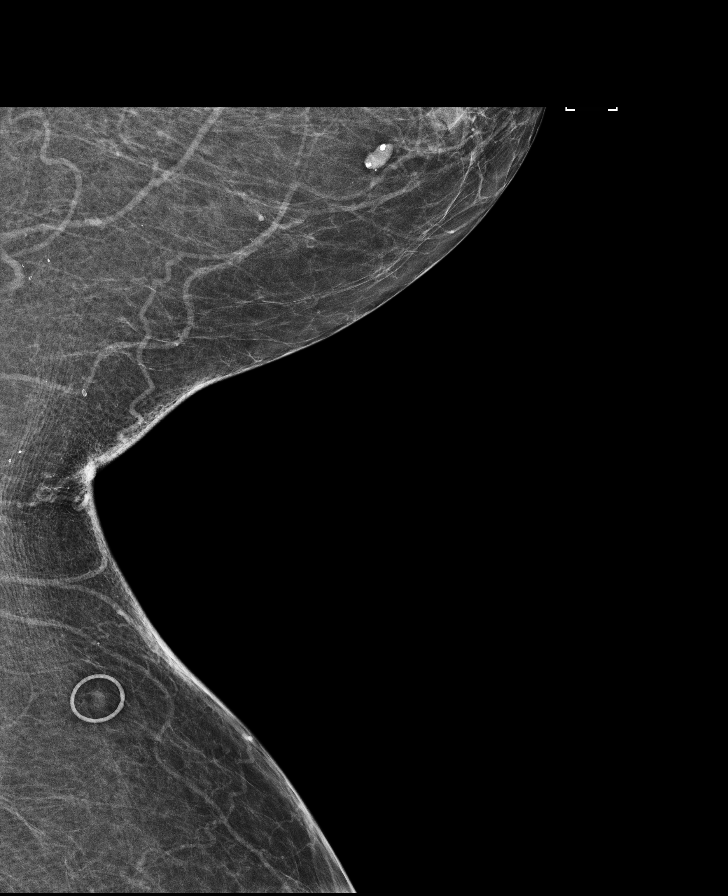

[R MLO (1 of 2)]
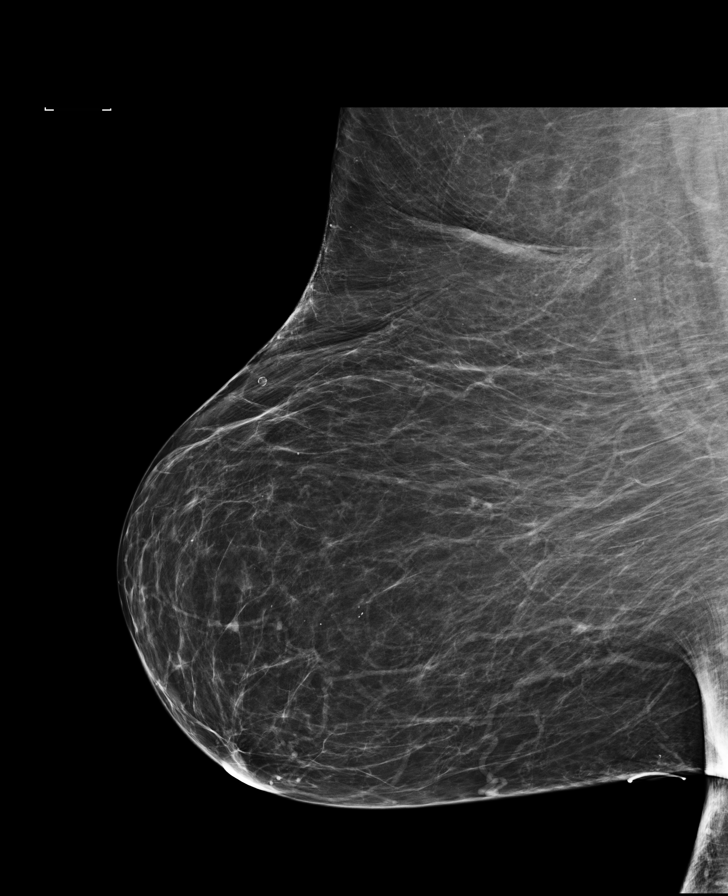

[R MLO (2 of 2)]
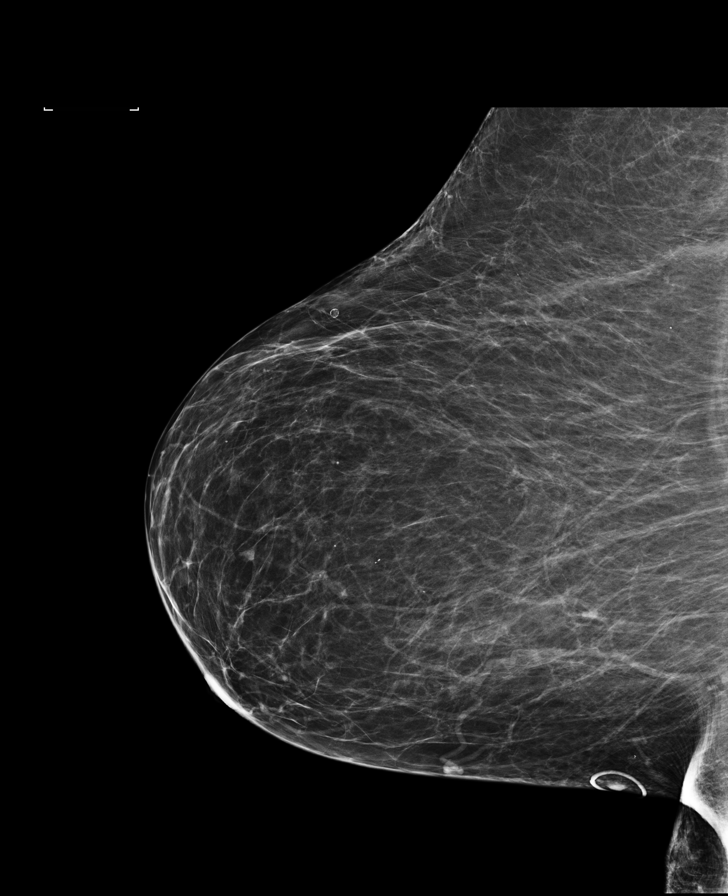

[R CV]
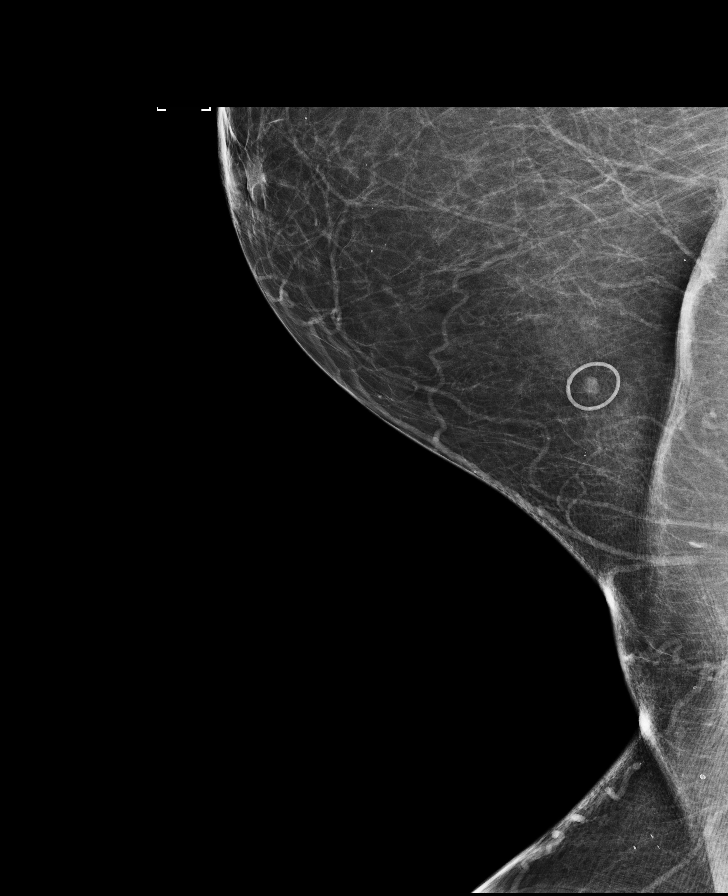

[L MLO (1 of 2)]
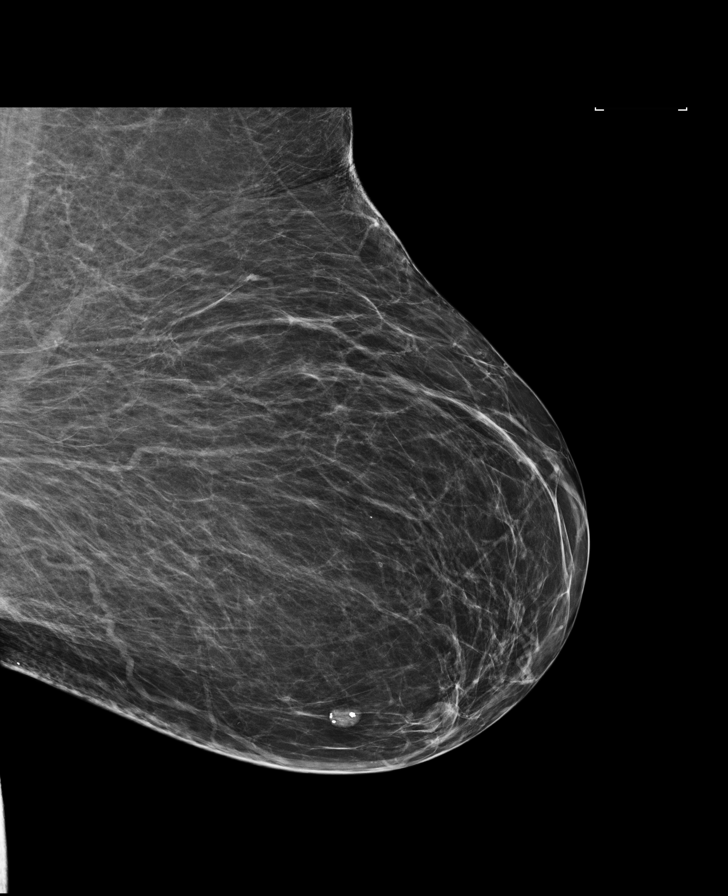

[L MLO (2 of 2)]
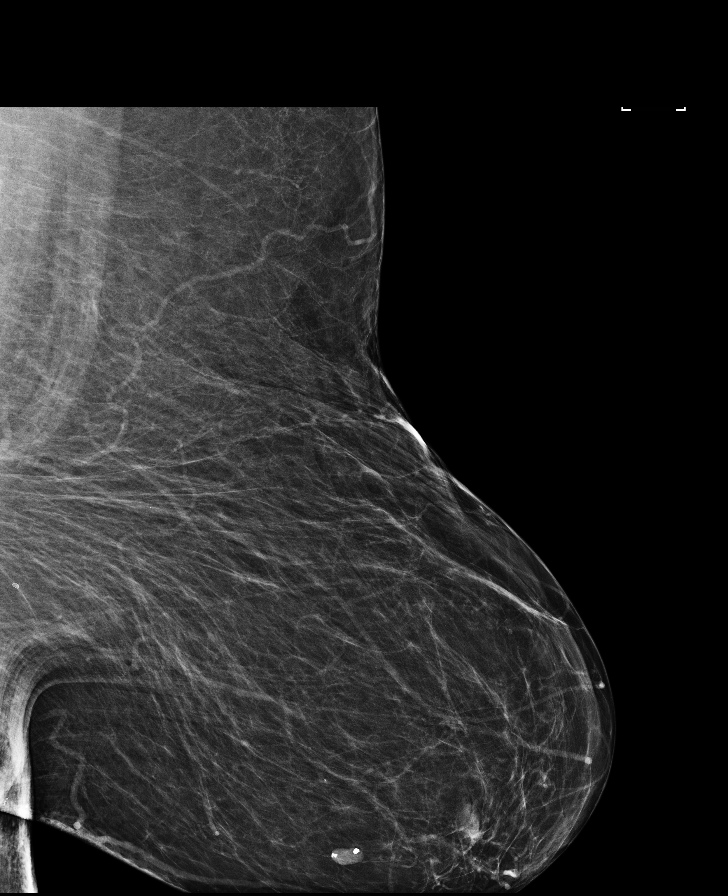

[8 of 8 positions shown; findings below may reference images not displayed]

ACR Breast Density Category b: There are scattered areas of
fibroglandular density.
FINDINGS: There are no findings suspicious for malignancy. Images were
processed with CAD.
IMPRESSION: No mammographic evidence of malignancy. A result letter of this
screening mammogram will be mailed directly to the patient.

RECOMMENDATION:
Screening mammogram in one year. (Code:AS-G-LCT)

BI-RADS CATEGORY  1: Negative.

## 2017-01-07 ENCOUNTER — Telehealth: Payer: Self-pay | Admitting: Cardiovascular Disease

## 2017-01-07 ENCOUNTER — Encounter
Admission: RE | Admit: 2017-01-07 | Discharge: 2017-01-07 | Disposition: A | Discharge status: Home / Self Care | Payer: PPO | Source: Ambulatory Visit | Attending: Anesthesiology | Admitting: Anesthesiology

## 2017-01-07 DIAGNOSIS — I10 Essential (primary) hypertension: Secondary | ICD-10-CM | POA: Diagnosis not present

## 2017-01-07 DIAGNOSIS — Z01812 Encounter for preprocedural laboratory examination: Secondary | ICD-10-CM | POA: Insufficient documentation

## 2017-01-07 DIAGNOSIS — M129 Arthropathy, unspecified: Secondary | ICD-10-CM | POA: Diagnosis not present

## 2017-01-07 DIAGNOSIS — J45909 Unspecified asthma, uncomplicated: Secondary | ICD-10-CM | POA: Diagnosis not present

## 2017-01-07 DIAGNOSIS — K529 Noninfective gastroenteritis and colitis, unspecified: Secondary | ICD-10-CM | POA: Diagnosis not present

## 2017-01-07 DIAGNOSIS — E069 Thyroiditis, unspecified: Secondary | ICD-10-CM | POA: Diagnosis not present

## 2017-01-07 DIAGNOSIS — D509 Iron deficiency anemia, unspecified: Secondary | ICD-10-CM | POA: Diagnosis not present

## 2017-01-07 DIAGNOSIS — R0602 Shortness of breath: Secondary | ICD-10-CM | POA: Diagnosis not present

## 2017-01-07 DIAGNOSIS — Z96619 Presence of unspecified artificial shoulder joint: Secondary | ICD-10-CM | POA: Diagnosis not present

## 2017-01-07 DIAGNOSIS — R1013 Epigastric pain: Secondary | ICD-10-CM | POA: Diagnosis not present

## 2017-01-07 LAB — BASIC METABOLIC PANEL
Anion gap: 11 (ref 5–15)
BUN: 20 mg/dL (ref 6–20)
CALCIUM: 8.6 mg/dL — AB (ref 8.9–10.3)
CO2: 24 mmol/L (ref 22–32)
Chloride: 105 mmol/L (ref 101–111)
Creatinine, Ser: 0.88 mg/dL (ref 0.44–1.00)
GFR calc Af Amer: >60 mL/min (ref >60)
GLUCOSE: 129 mg/dL — AB (ref 65–99)
POTASSIUM: 4.7 mmol/L (ref 3.5–5.1)
SODIUM: 140 mmol/L (ref 135–145)

## 2017-01-07 NOTE — Telephone Encounter (Signed)
Clearance for 10/22 colonoscopy/EGD with Loistine Simas, MD at [facility] placed in MD basket.

## 2017-01-07 NOTE — Care Management (Signed)
Pt who is well known to me. Obesity. Hx of AF and PVCs. Sleep apnea, she does use CPAP. She had had gastric sleeve. Last echo showed EF  Of 65. In the most recent EKG I do see p waves. So the Afib must be paroxsymal. Seen by Ar[facility] 4 months ago and was given the green light. Her saturations have always dropped after surgery. She needed to be kept overnight. She does not use O2 at home at present. Last K was 5.1.  Do not need further cardiology consult. Will get Met B to check on K. Warned her that if saturations fall  We may have to keep her overnight.  Proceed with EGD and colonoscopy.  MST

## 2017-01-07 NOTE — Telephone Encounter (Signed)
Faxed clearance to [phone] Pt cleared for colonoscopy and my hold pradaxa 2 days prior

## 2017-01-11 ENCOUNTER — Ambulatory Visit: Payer: PPO | Admitting: Anesthesiology

## 2017-01-11 ENCOUNTER — Encounter: Payer: Self-pay | Admitting: *Deleted

## 2017-01-11 ENCOUNTER — Ambulatory Visit
Admission: RE | Admit: 2017-01-11 | Discharge: 2017-01-11 | Disposition: A | Discharge status: Home / Self Care | Payer: PPO | Source: Ambulatory Visit | Attending: Gastroenterology | Admitting: Gastroenterology

## 2017-01-11 ENCOUNTER — Ambulatory Visit: Payer: PPO | Admitting: Family Medicine

## 2017-01-11 ENCOUNTER — Encounter
Admission: RE | Disposition: A | Discharge status: Home / Self Care | Payer: Self-pay | Source: Ambulatory Visit | Attending: Gastroenterology

## 2017-01-11 DIAGNOSIS — Z6841 Body Mass Index (BMI) 40.0 and over, adult: Secondary | ICD-10-CM | POA: Insufficient documentation

## 2017-01-11 DIAGNOSIS — D509 Iron deficiency anemia, unspecified: Secondary | ICD-10-CM | POA: Insufficient documentation

## 2017-01-11 DIAGNOSIS — Z538 Procedure and treatment not carried out for other reasons: Secondary | ICD-10-CM | POA: Diagnosis not present

## 2017-01-11 DIAGNOSIS — K317 Polyp of stomach and duodenum: Secondary | ICD-10-CM | POA: Insufficient documentation

## 2017-01-11 DIAGNOSIS — E785 Hyperlipidemia, unspecified: Secondary | ICD-10-CM | POA: Diagnosis not present

## 2017-01-11 DIAGNOSIS — E039 Hypothyroidism, unspecified: Secondary | ICD-10-CM | POA: Diagnosis not present

## 2017-01-11 DIAGNOSIS — F329 Major depressive disorder, single episode, unspecified: Secondary | ICD-10-CM | POA: Diagnosis not present

## 2017-01-11 DIAGNOSIS — Z886 Allergy status to analgesic agent status: Secondary | ICD-10-CM | POA: Insufficient documentation

## 2017-01-11 DIAGNOSIS — E1140 Type 2 diabetes mellitus with diabetic neuropathy, unspecified: Secondary | ICD-10-CM | POA: Diagnosis not present

## 2017-01-11 DIAGNOSIS — F419 Anxiety disorder, unspecified: Secondary | ICD-10-CM | POA: Diagnosis not present

## 2017-01-11 DIAGNOSIS — K580 Irritable bowel syndrome with diarrhea: Secondary | ICD-10-CM | POA: Insufficient documentation

## 2017-01-11 DIAGNOSIS — Z7984 Long term (current) use of oral hypoglycemic drugs: Secondary | ICD-10-CM | POA: Diagnosis not present

## 2017-01-11 DIAGNOSIS — D131 Benign neoplasm of stomach: Secondary | ICD-10-CM | POA: Diagnosis not present

## 2017-01-11 DIAGNOSIS — Z9884 Bariatric surgery status: Secondary | ICD-10-CM | POA: Diagnosis not present

## 2017-01-11 DIAGNOSIS — D649 Anemia, unspecified: Secondary | ICD-10-CM | POA: Diagnosis not present

## 2017-01-11 DIAGNOSIS — K2960 Other gastritis without bleeding: Secondary | ICD-10-CM | POA: Diagnosis not present

## 2017-01-11 DIAGNOSIS — I480 Paroxysmal atrial fibrillation: Secondary | ICD-10-CM | POA: Diagnosis not present

## 2017-01-11 DIAGNOSIS — K219 Gastro-esophageal reflux disease without esophagitis: Secondary | ICD-10-CM | POA: Diagnosis not present

## 2017-01-11 DIAGNOSIS — I872 Venous insufficiency (chronic) (peripheral): Secondary | ICD-10-CM | POA: Diagnosis not present

## 2017-01-11 DIAGNOSIS — E1151 Type 2 diabetes mellitus with diabetic peripheral angiopathy without gangrene: Secondary | ICD-10-CM | POA: Diagnosis not present

## 2017-01-11 DIAGNOSIS — R011 Cardiac murmur, unspecified: Secondary | ICD-10-CM | POA: Diagnosis not present

## 2017-01-11 DIAGNOSIS — G4730 Sleep apnea, unspecified: Secondary | ICD-10-CM | POA: Diagnosis not present

## 2017-01-11 DIAGNOSIS — K210 Gastro-esophageal reflux disease with esophagitis: Secondary | ICD-10-CM | POA: Diagnosis not present

## 2017-01-11 DIAGNOSIS — K2980 Duodenitis without bleeding: Secondary | ICD-10-CM | POA: Diagnosis not present

## 2017-01-11 DIAGNOSIS — Z888 Allergy status to other drugs, medicaments and biological substances status: Secondary | ICD-10-CM | POA: Insufficient documentation

## 2017-01-11 DIAGNOSIS — Z79899 Other long term (current) drug therapy: Secondary | ICD-10-CM | POA: Insufficient documentation

## 2017-01-11 DIAGNOSIS — K2970 Gastritis, unspecified, without bleeding: Secondary | ICD-10-CM | POA: Diagnosis not present

## 2017-01-11 DIAGNOSIS — Z86010 Personal history of colonic polyps: Secondary | ICD-10-CM | POA: Diagnosis not present

## 2017-01-11 DIAGNOSIS — Z87442 Personal history of urinary calculi: Secondary | ICD-10-CM | POA: Insufficient documentation

## 2017-01-11 DIAGNOSIS — G4700 Insomnia, unspecified: Secondary | ICD-10-CM | POA: Insufficient documentation

## 2017-01-11 DIAGNOSIS — M1990 Unspecified osteoarthritis, unspecified site: Secondary | ICD-10-CM | POA: Insufficient documentation

## 2017-01-11 DIAGNOSIS — Z8614 Personal history of Methicillin resistant Staphylococcus aureus infection: Secondary | ICD-10-CM | POA: Insufficient documentation

## 2017-01-11 DIAGNOSIS — J45909 Unspecified asthma, uncomplicated: Secondary | ICD-10-CM | POA: Diagnosis not present

## 2017-01-11 DIAGNOSIS — I10 Essential (primary) hypertension: Secondary | ICD-10-CM | POA: Insufficient documentation

## 2017-01-11 DIAGNOSIS — Z86718 Personal history of other venous thrombosis and embolism: Secondary | ICD-10-CM | POA: Insufficient documentation

## 2017-01-11 DIAGNOSIS — E669 Obesity, unspecified: Secondary | ICD-10-CM | POA: Diagnosis not present

## 2017-01-11 DIAGNOSIS — Z7901 Long term (current) use of anticoagulants: Secondary | ICD-10-CM | POA: Insufficient documentation

## 2017-01-11 DIAGNOSIS — Z880 Allergy status to penicillin: Secondary | ICD-10-CM | POA: Insufficient documentation

## 2017-01-11 DIAGNOSIS — I739 Peripheral vascular disease, unspecified: Secondary | ICD-10-CM | POA: Diagnosis not present

## 2017-01-11 DIAGNOSIS — E119 Type 2 diabetes mellitus without complications: Secondary | ICD-10-CM | POA: Diagnosis not present

## 2017-01-11 DIAGNOSIS — K2950 Unspecified chronic gastritis without bleeding: Secondary | ICD-10-CM | POA: Diagnosis not present

## 2017-01-11 DIAGNOSIS — R1013 Epigastric pain: Secondary | ICD-10-CM | POA: Diagnosis not present

## 2017-01-11 DIAGNOSIS — Z885 Allergy status to narcotic agent status: Secondary | ICD-10-CM | POA: Insufficient documentation

## 2017-01-11 DIAGNOSIS — K2900 Acute gastritis without bleeding: Secondary | ICD-10-CM | POA: Diagnosis not present

## 2017-01-11 DIAGNOSIS — R197 Diarrhea, unspecified: Secondary | ICD-10-CM | POA: Diagnosis not present

## 2017-01-11 HISTORY — DX: Hyperlipidemia, unspecified: E78.5

## 2017-01-11 HISTORY — DX: Cardiac arrhythmia, unspecified: I49.9

## 2017-01-11 HISTORY — PX: COLONOSCOPY WITH PROPOFOL: SHX5780

## 2017-01-11 HISTORY — DX: Essential (primary) hypertension: I10

## 2017-01-11 HISTORY — DX: Unspecified urinary incontinence: R32

## 2017-01-11 HISTORY — DX: Edema, unspecified: R60.9

## 2017-01-11 HISTORY — DX: Raised antibody titer: R76.0

## 2017-01-11 HISTORY — PX: ESOPHAGOGASTRODUODENOSCOPY (EGD) WITH PROPOFOL: SHX5813

## 2017-01-11 LAB — GLUCOSE, CAPILLARY: Glucose-Capillary: 136 mg/dL — ABNORMAL HIGH (ref 65–99)

## 2017-01-11 SURGERY — ESOPHAGOGASTRODUODENOSCOPY (EGD) WITH PROPOFOL
Anesthesia: General

## 2017-01-11 MED ORDER — LIDOCAINE HCL (PF) 2 % IJ SOLN
INTRAMUSCULAR | Status: AC
  Filled 2017-01-11: qty 10

## 2017-01-11 MED ORDER — BUTAMBEN-TETRACAINE-BENZOCAINE 2-2-14 % EX AERO
INHALATION_SPRAY | CUTANEOUS | Status: AC
  Filled 2017-01-11: qty 5

## 2017-01-11 MED ORDER — SODIUM CHLORIDE 0.9 % IV SOLN: INTRAVENOUS | Status: DC

## 2017-01-11 MED ORDER — MIDAZOLAM HCL 2 MG/2ML IJ SOLN
INTRAMUSCULAR | Status: DC | PRN
  Administered 2017-01-11: 2 mg via INTRAVENOUS

## 2017-01-11 MED ORDER — LIDOCAINE HCL (CARDIAC) 20 MG/ML IV SOLN
INTRAVENOUS | Status: DC | PRN
Start: 2017-01-11 — End: 2017-01-11
  Administered 2017-01-11: 30 mg via INTRAVENOUS

## 2017-01-11 MED ORDER — IPRATROPIUM-ALBUTEROL 0.5-2.5 (3) MG/3ML IN SOLN
RESPIRATORY_TRACT | Status: AC
  Administered 2017-01-11: 3 mL via RESPIRATORY_TRACT
  Filled 2017-01-11: qty 3

## 2017-01-11 MED ORDER — CLINDAMYCIN PHOSPHATE 600 MG/50ML IV SOLN
INTRAVENOUS | Status: AC
  Administered 2017-01-11: 600 mg via INTRAVENOUS
  Filled 2017-01-11: qty 50

## 2017-01-11 MED ORDER — MIDAZOLAM HCL 2 MG/2ML IJ SOLN
INTRAMUSCULAR | Status: AC
  Filled 2017-01-11: qty 2

## 2017-01-11 MED ORDER — PROPOFOL 500 MG/50ML IV EMUL
INTRAVENOUS | Status: DC | PRN
  Administered 2017-01-11: 100 ug/kg/min via INTRAVENOUS

## 2017-01-11 MED ORDER — FENTANYL CITRATE (PF) 100 MCG/2ML IJ SOLN
INTRAMUSCULAR | Status: AC
  Filled 2017-01-11: qty 2

## 2017-01-11 MED ORDER — FENTANYL CITRATE (PF) 100 MCG/2ML IJ SOLN
INTRAMUSCULAR | Status: DC | PRN
  Administered 2017-01-11: 50 ug via INTRAVENOUS

## 2017-01-11 MED ORDER — CLINDAMYCIN PHOSPHATE 300 MG/2ML IJ SOLN
600.0000 mg | Freq: Once | INTRAMUSCULAR | Status: DC
  Filled 2017-01-11: qty 4

## 2017-01-11 MED ORDER — SODIUM CHLORIDE 0.9 % IV SOLN
INTRAVENOUS | Status: DC
  Administered 2017-01-11: 09:00:00 via INTRAVENOUS

## 2017-01-11 MED ORDER — IPRATROPIUM-ALBUTEROL 0.5-2.5 (3) MG/3ML IN SOLN
3.0000 mL | Freq: Four times a day (QID) | RESPIRATORY_TRACT | Status: DC
Start: 2017-01-11 — End: 2017-01-11
  Administered 2017-01-11: 3 mL via RESPIRATORY_TRACT

## 2017-01-11 MED ORDER — CLINDAMYCIN PHOSPHATE 600 MG/50ML IV SOLN
600.0000 mg | Freq: Once | INTRAVENOUS | Status: AC
  Administered 2017-01-11: 600 mg via INTRAVENOUS

## 2017-01-11 MED ORDER — PROPOFOL 500 MG/50ML IV EMUL
INTRAVENOUS | Status: AC
  Filled 2017-01-11: qty 50

## 2017-01-11 NOTE — Anesthesia Procedure Notes (Signed)
Performed by: Vaughan Sine Pre-anesthesia Checklist: Patient identified, Emergency Drugs available, Patient being monitored, Suction available and Timeout performed Patient Re-evaluated:Patient Re-evaluated prior to induction Oxygen Delivery Method: Nasal cannula Preoxygenation: Pre-oxygenation with 100% oxygen Induction Type: IV induction Airway Equipment and Method: Bite block Placement Confirmation: CO2 detector and positive ETCO2

## 2017-01-11 NOTE — Anesthesia Post-op Follow-up Note (Signed)
Anesthesia QCDR form completed.        

## 2017-01-11 NOTE — Anesthesia Postprocedure Evaluation (Signed)
Anesthesia Post Note  Patient: [name]  Procedure(s) Performed: ESOPHAGOGASTRODUODENOSCOPY (EGD) WITH PROPOFOL (N/A ) COLONOSCOPY WITH PROPOFOL (N/A )  Patient location during evaluation: Endoscopy Anesthesia Type: General Level of consciousness: awake and alert and oriented Pain management: pain level controlled Vital Signs Assessment: post-procedure vital signs reviewed and stable Respiratory status: spontaneous breathing, nonlabored ventilation and respiratory function stable Cardiovascular status: blood pressure returned to baseline and stable Postop Assessment: no signs of nausea or vomiting Anesthetic complications: no     Last Vitals:  Vitals:   01/11/17 1130 01/11/17 1140  BP: (!) 165/84 135/71  Pulse:  94  Resp: 19 20  Temp:    SpO2: 96% 91%    Last Pain:  Vitals:   01/11/17 1100  TempSrc: Tympanic                 Alexys Lobello

## 2017-01-11 NOTE — Anesthesia Preprocedure Evaluation (Signed)
Anesthesia Evaluation  Patient identified by MRN, date of birth, ID band Patient awake    Reviewed: Allergy & Precautions, NPO status , Patient's Chart, lab work & pertinent test results  History of Anesthesia Complications Negative for: history of anesthetic complications  Airway Mallampati: II  TM Distance: >3 FB Neck ROM: Full    Dental  (+) Poor Dentition   Pulmonary asthma , sleep apnea and Continuous Positive Airway Pressure Ventilation ,    breath sounds clear to auscultation- rhonchi (-) wheezing      Cardiovascular hypertension, Pt. on medications + Peripheral Vascular Disease  (-) CAD, (-) Past MI and (-) Cardiac Stents + dysrhythmias Atrial Fibrillation  Rhythm:Regular Rate:Normal - Systolic murmurs and - Diastolic murmurs    Neuro/Psych PSYCHIATRIC DISORDERS Anxiety Depression    GI/Hepatic Neg liver ROS, GERD  ,  Endo/Other  diabetes, Oral Hypoglycemic AgentsHypothyroidism   Renal/GU Renal disease: hx of nephrolithiasis.     Musculoskeletal  (+) Arthritis ,   Abdominal (+) + obese,   Peds  Hematology  (+) anemia ,   Anesthesia Other Findings Past Medical History: No date: Abnormal antibody titer No date: Anxiety and depression No date: Arthritis No date: Asthma No date: Cystocele No date: Depression No date: Diabetes ([place]) No date: DVT (deep venous thrombosis) (HCC)     Comment:  Righ calf No date: Dysrhythmia No date: Edema No date: GERD (gastroesophageal reflux disease) No date: Heart murmur     Comment:  Echocardiogram June 2015: Mild MR (possible vegetation               seen on TTE, not seen on TEE), normal LV size with               moderate concentric LVH. Normal function EF 60-65%.               Normal diastolic function. Mild LA dilation. No date: History of IBS 2008: History of methicillin resistant staphylococcus aureus (MRSA) No date: Hyperlipidemia No date: Hypertension No  date: Hypothyroidism No date: Insomnia No date: Kidney stone     Comment:  kidney stones with lithotripsy No date: Neuropathy involving both lower extremities No date: Obesity, Class II, BMI 35-39.9, with comorbidity 2007: Paroxysmal atrial fibrillation ([place])     Comment:  In June 2015, Cardiac Event Monitor: Mostly SR/sinus               arrhythmia with PVCs that are frequent. Short bursts of               A. fib lasting several minutes;; CHA2DS2-VASc Score = 5               (age, Female, PVD, DM, HTN) No date: Peripheral vascular disease (HCC) No date: Sleep apnea     Comment:  use C-PAP No date: Urinary incontinence No date: Venous stasis dermatitis of both lower extremities   Reproductive/Obstetrics                             Anesthesia Physical Anesthesia Plan  ASA: III  Anesthesia Plan: General   Post-op Pain Management:    Induction: Intravenous  PONV Risk Score and Plan: 2 and Propofol infusion  Airway Management Planned: Natural Airway  Additional Equipment:   Intra-op Plan:   Post-operative Plan:   Informed Consent: I have reviewed the patients History and Physical, chart, labs and discussed the procedure including the risks, benefits and  alternatives for the proposed anesthesia with the patient or authorized representative who has indicated his/her understanding and acceptance.   Dental advisory given  Plan Discussed with: CRNA and Anesthesiologist  Anesthesia Plan Comments:         Anesthesia Quick Evaluation

## 2017-01-11 NOTE — H&P (Addendum)
Outpatient short stay form Pre-procedure 01/11/2017 10:03 AM [name] MD  Primary Physician: Dr. Roanna Epley  Reason for visit:  EGD and colonoscopy  History of present illness:  Patient is a 66 year old female presenting today as above. She has a history of some iron deficiency anemia. There's been some dyspepsia as well. She has a history of chronic diarrhea for many years. She has had a colonoscopy in the past that indicated colon polyps. She is also had a gastric sleeve procedure. She does take DEXA Lantz regularly.  She does take Pradaxa however she has held that her last dose being 3 days ago. Renal function is normal. She takes no other blood thinning agents or aspirin.    Current Facility-Administered Medications:  .  0.9 %  sodium chloride infusion, , Intravenous, Continuous, [name], MD, Last Rate: 20 mL/hr at 01/11/17 0923 .  0.9 %  sodium chloride infusion, , Intravenous, Continuous, [name], MD .  butamben-tetracaine-benzocaine (CETACAINE) 04-24-12 % spray, , , ,   Prescriptions Prior to Admission  Medication Sig Dispense Refill Last Dose  . albuterol (PROVENTIL HFA;VENTOLIN HFA) 108 (90 Base) MCG/ACT inhaler Inhale 1-2 puffs into the lungs every 4 (four) hours as needed for wheezing or shortness of breath. (Patient taking differently: Inhale 2 puffs into the lungs every 4 (four) hours as needed for wheezing or shortness of breath. ) 8 g 2 01/11/2017 at 0600  . ALPRAZolam (XANAX) 0.5 MG tablet TAKE ONE TABLET BY MOUTH TWICE DAILY AS NEEDED 60 tablet 0 Past Week at Unknown time  . buPROPion (WELLBUTRIN XL) 300 MG 24 hr tablet TAKE ONE TABLET BY MOUTH EVERY DAY 90 tablet 1 01/10/2017 at Unknown time  . calcium carbonate (OSCAL) 1500 (600 Ca) MG TABS tablet Take 600 mg of elemental calcium by mouth 2 (two) times daily with a meal.   Past Week at Unknown time  . cyanocobalamin 500 MCG tablet Take 500 mcg by mouth daily.   Past Week at Unknown time  .  DULoxetine (CYMBALTA) 60 MG capsule Take 1 capsule (60 mg total) by mouth daily. 90 capsule 0 01/10/2017 at Unknown time  . gabapentin (NEURONTIN) 800 MG tablet Take 1 tablet (800 mg total) by mouth 4 (four) times daily. 360 tablet 0 01/10/2017 at Unknown time  . levocetirizine (XYZAL) 5 MG tablet Take 1 tablet (5 mg total) by mouth every evening. 90 tablet 3 Past Week at Unknown time  . levothyroxine (SYNTHROID, LEVOTHROID) 150 MCG tablet TAKE ONE TABLET BY MOUTH EVERY MORNING 90 tablet 3 01/11/2017 at 0600  . meloxicam (MOBIC) 15 MG tablet Take 15 mg by mouth daily.    01/10/2017 at Unknown time  . metFORMIN (GLUCOPHAGE) 1000 MG tablet TAKE ONE TABLET BY MOUTH TWICE DAILY (Patient taking differently: TAKE ONE TABLET IN THE MORNING AND HALF TABLET AT NIGHT) 180 tablet 1 Past Week at Unknown time  . metoprolol tartrate (LOPRESSOR) 25 MG tablet TAKE ONE TABLET TWICE DAILY 180 tablet 3 01/10/2017 at Unknown time  . mirabegron ER (MYRBETRIQ) 25 MG TB24 tablet Take 1 tablet (25 mg total) by mouth daily. 30 tablet 12 01/11/2017 at 0600  . montelukast (SINGULAIR) 10 MG tablet TAKE ONE TABLET BY MOUTH EVERY DAY (Patient taking differently: TAKE ONE TABLET BY MOUTH EVERY evening) 90 tablet 3 01/10/2017 at Unknown time  . Multiple Vitamin (MULTIVITAMIN WITH MINERALS) TABS tablet Take 1 tablet by mouth daily.   Past Week at Unknown time  . oxybutynin (DITROPAN XL)  15 MG 24 hr tablet Take 1 tablet (15 mg total) by mouth at bedtime. 90 tablet 3 01/10/2017 at Unknown time  . quinapril (ACCUPRIL) 20 MG tablet TAKE ONE TABLET EVERY DAY 90 tablet 3 01/11/2017 at 0600  . albuterol (PROVENTIL) (2.5 MG/3ML) 0.083% nebulizer solution Take 3 mLs (2.5 mg total) by nebulization every 6 (six) hours as needed for wheezing or shortness of breath. 150 mL 1 Taking  . cetirizine (ZYRTEC) 10 MG tablet cetirizine 10 mg tablet  TAKE 1 TABLET ONCE A DAY ORALLY   Not Taking  . dabigatran (PRADAXA) 150 MG CAPS capsule TAKE 1 CAPSULE BY  MOUTH TWICE DAILY 180 capsule 3 01/08/2017  . Dexlansoprazole 30 MG capsule Take 2 capsules (60 mg total) by mouth daily. 180 capsule 1 Taking  . estradiol (ESTRACE) 0.1 MG/GM vaginal cream Place 1 Applicatorful vaginally 3 (three) times a week. 42.5 g 12   . HYDROcodone-acetaminophen (NORCO/VICODIN) 5-325 MG tablet Take 1 tablet by mouth every 8 (eight) hours as needed for severe pain. 90 tablet 0 Taking  . [START ON 01/12/2017] HYDROcodone-acetaminophen (NORCO/VICODIN) 5-325 MG tablet Take 1 tablet by mouth every 8 (eight) hours as needed for severe pain. (Patient not taking: Reported on 12/03/2016) 90 tablet 0 Not Taking  . [START ON 02/11/2017] HYDROcodone-acetaminophen (NORCO/VICODIN) 5-325 MG tablet Take 1 tablet by mouth every 8 (eight) hours as needed for severe pain. (Patient not taking: Reported on 12/03/2016) 90 tablet 0 Not Taking  . hydrocortisone cream 1 % Apply 1 application topically daily as needed for itching.   Taking  . nitrofurantoin, macrocrystal-monohydrate, (MACROBID) 100 MG capsule Take 1 capsule (100 mg total) by mouth every 12 (twelve) hours. (Patient not taking: Reported on 01/11/2017) 14 capsule 0 Completed Course at Unknown time     Allergies  Allergen Reactions  . Penicillins Hives, Shortness Of Breath and Swelling    Facial swelling Has patient had a PCN reaction causing immediate rash, facial/tongue/throat swelling, SOB or lightheadedness with hypotension: Yes Has patient had a PCN reaction causing severe rash involving mucus membranes or skin necrosis: Yes Has patient had a PCN reaction that required hospitalization No Has patient had a PCN reaction occurring within the last 10 years: No If all of the above answers are "NO", then may proceed with Cephalosporin use.   . Aspirin Hives  . Morphine And Related Other (See Comments)    Patient becomes very confused  . Terramycin [Oxytetracycline] Hives     Past Medical History:  Diagnosis Date  . Abnormal  antibody titer   . Anxiety and depression   . Arthritis   . Asthma   . Cystocele   . Depression   . Diabetes ([place])   . DVT (deep venous thrombosis) (HCC)    Righ calf  . Dysrhythmia   . Edema   . GERD (gastroesophageal reflux disease)   . Heart murmur    Echocardiogram June 2015: Mild MR (possible vegetation seen on TTE, not seen on TEE), normal LV size with moderate concentric LVH. Normal function EF 60-65%. Normal diastolic function. Mild LA dilation.  Marland Kitchen History of IBS   . History of methicillin resistant staphylococcus aureus (MRSA) 2008  . Hyperlipidemia   . Hypertension   . Hypothyroidism   . Insomnia   . Kidney stone    kidney stones with lithotripsy  . Neuropathy involving both lower extremities   . Obesity, Class II, BMI 35-39.9, with comorbidity   . Paroxysmal atrial fibrillation ([place]) 2007   In  June 2015, Cardiac Event Monitor: Mostly SR/sinus arrhythmia with PVCs that are frequent. Short bursts of A. fib lasting several minutes;; CHA2DS2-VASc Score = 5 (age, Female, PVD, DM, HTN)  . Peripheral vascular disease ([place])   . Sleep apnea    use C-PAP  . Urinary incontinence   . Venous stasis dermatitis of both lower extremities     Review of systems:      Physical Exam    Heart and lungs: Regular rate and rhythm without rub or gallop, lungs are bilaterally clear.    HEENT: Normocephalic atraumatic eyes are anicteric    Other:     Pertinant exam for procedure: Soft nontender nondistended bowel sounds positive normoactive.    Planned proceedures: EGD, colonoscopy and indicated procedures. I have discussed the risks benefits and complications of procedures to include not limited to bleeding, infection, perforation and the risk of sedation and the patient wishes to proceed.    [name], MD Gastroenterology 01/11/2017  10:03 AM

## 2017-01-11 NOTE — Transfer of Care (Signed)
Immediate Anesthesia Transfer of Care Note  Patient: [name]  Procedure(s) Performed: ESOPHAGOGASTRODUODENOSCOPY (EGD) WITH PROPOFOL (N/A ) COLONOSCOPY WITH PROPOFOL (N/A )  Patient Location: PACU  Anesthesia Type:General  Level of Consciousness: awake and sedated  Airway & Oxygen Therapy: Patient Spontanous Breathing and Patient connected to nasal cannula oxygen  Post-op Assessment: Report given to RN and Post -op Vital signs reviewed and stable  Post vital signs: Reviewed and stable  Last Vitals:  Vitals:   01/11/17 0850  BP: (!) 178/69  Pulse: 69  Resp: 18  Temp: 37.6 C  SpO2: 98%    Last Pain:  Vitals:   01/11/17 0850  TempSrc: Tympanic         Complications: No apparent anesthesia complications

## 2017-01-11 NOTE — Op Note (Signed)
[facility] Gastroenterology Patient Name: [name] Procedure Date: 01/11/2017 9:58 AM MRN: [id number] Account #: [id number] Date of Birth: 03/11/1951 Admit Type: Outpatient Age: 66 Room: [facility] ENDO ROOM 1 Gender: Female Note Status: Finalized Procedure:            Upper GI endoscopy Indications:          Dyspepsia Providers:            [name], MD Referring MD:         Angela Adam. Caryl Bis (Referring MD) Medicines:            Monitored Anesthesia Care Complications:        No immediate complications. Procedure:            Pre-Anesthesia Assessment:                       - ASA Grade Assessment: III - A patient with severe                        systemic disease.                       After obtaining informed consent, the endoscope was                        passed under direct vision. Throughout the procedure,                        the patient's blood pressure, pulse, and oxygen                        saturations were monitored continuously. The Endoscope                        was introduced through the mouth, and advanced to the                        third part of duodenum. The upper GI endoscopy was                        accomplished without difficulty. The patient tolerated                        the procedure well. Findings:      The Z-line was variable. Biopsies were taken with a cold forceps for       histology.      prominant cricopharyngeus noted      Patchy mild inflammation characterized by congestion (edema) and       erythema was found in the gastric body and in the gastric antrum.       Biopsies were taken with a cold forceps for histology.      Diffuse mild mucosal variance characterized by smoothness was found in       the second portion of the duodenum and in the third portion of the       duodenum. Biopsies were taken with a cold forceps for histology.      Localized granular mucosa was found in the duodenal bulb. Biopsies  were       taken with a cold forceps for histology.      Evidence of a sleeve gastrectomy was found  in the gastric body. This was       characterized by healthy appearing mucosa.      Single, adjacent to scar 3 mm sessile polyps with no bleeding and no       stigmata of recent bleeding were found in the gastric body. Biopsies       were taken with a cold forceps for histology. Impression:           - Z-line variable. Biopsied.                       - Bile gastritis. Biopsied.                       - Mucosal variant in the duodenum. Biopsied.                       - Granular mucosa in the duodenal bulb. Biopsied.                       - A sleeve gastrectomy was found, characterized by                        healthy appearing mucosa.                       - Single, adjacent to scar gastric polyps. Biopsied. Recommendation:       - Perform a colonoscopy today.                       - Return to GI clinic in 1 month. Procedure Code(s):    --- Professional ---                       [phone], Esophagogastroduodenoscopy, flexible, transoral;                        with biopsy, single or multiple Diagnosis Code(s):    --- Professional ---                       K22.8, Other specified diseases of esophagus                       K29.60, Other gastritis without bleeding                       K31.89, Other diseases of stomach and duodenum                       Z98.84, Bariatric surgery status                       K31.7, Polyp of stomach and duodenum                       R10.13, Epigastric pain CPT copyright 2016 American [employer]. All rights reserved. The codes documented in this report are preliminary and upon coder review may  be revised to meet current compliance requirements. [name], MD 01/11/2017 10:58:56 AM This report has been signed electronically. Number of Addenda: 0 Note Initiated On: 01/11/2017 9:58 AM      [facility]

## 2017-01-11 NOTE — Op Note (Signed)
[facility] Gastroenterology Patient Name: [name] Procedure Date: 01/11/2017 9:58 AM MRN: [id number] Account #: [id number] Date of Birth: 14-Aug-1950 Admit Type: Outpatient Age: 66 Room: [facility] ENDO ROOM 1 Gender: Female Note Status: Finalized Procedure:            Colonoscopy Indications:          Chronic diarrhea, Iron deficiency anemia Providers:            [name], MD Referring MD:         Angela Adam. Caryl Bis (Referring MD) Medicines:            Monitored Anesthesia Care Complications:        No immediate complications. Procedure:            Pre-Anesthesia Assessment:                       - ASA Grade Assessment: III - A patient with severe                        systemic disease.                       After obtaining informed consent, the colonoscope was                        passed under direct vision. Throughout the procedure,                        the patient's blood pressure, pulse, and oxygen                        saturations were monitored continuously. The                        Colonoscope was introduced through the anus with the                        intention of advancing to the cecum. The scope was                        advanced to the sigmoid colon before the procedure was                        aborted. Medications were given. The colonoscopy was                        extremely difficult due to poor bowel prep with stool                        present. The patient tolerated the procedure well. The                        quality of the bowel preparation was poor. Findings:      Extensive amounts of liquid solid stool was found in the rectum and in       the sigmoid colon, precluding visualization.      The digital rectal exam was normal. Impression:           - Preparation of the colon was poor.                       -  Stool in the rectum and in the sigmoid colon.                       - No specimens collected.                        - The procedure was aborted due to poor bowel prep with                        stool present. Recommendation:       - Discharge patient to home.                       - reprep and reschedule. Procedure Code(s):    --- Professional ---                       [phone], 11, Colonoscopy, flexible; diagnostic, including                        collection of specimen(s) by brushing or washing, when                        performed (separate procedure) Diagnosis Code(s):    --- Professional ---                       Z53.8, Procedure and treatment not carried out for                        other reasons                       K52.9, Noninfective gastroenteritis and colitis,                        unspecified                       D50.9, Iron deficiency anemia, unspecified CPT copyright 2016 American Medical Association. All rights reserved. The codes documented in this report are preliminary and upon coder review may  be revised to meet current compliance requirements. [name], MD 01/11/2017 11:05:51 AM This report has been signed electronically. Number of Addenda: 0 Note Initiated On: 01/11/2017 9:58 AM Total Procedure Duration: 0 hours 1 minute 7 seconds       [facility]

## 2017-01-12 ENCOUNTER — Encounter: Payer: Self-pay | Admitting: Gastroenterology

## 2017-01-13 LAB — SURGICAL PATHOLOGY

## 2017-01-14 ENCOUNTER — Other Ambulatory Visit: Payer: Self-pay | Admitting: Family Medicine

## 2017-01-15 NOTE — Telephone Encounter (Signed)
Last OV was 11/27/16, last refill was 07/22/16, #90 with 1 refill, next appt is December , thanks

## 2017-01-27 DIAGNOSIS — R0600 Dyspnea, unspecified: Secondary | ICD-10-CM | POA: Diagnosis not present

## 2017-01-27 DIAGNOSIS — Z96619 Presence of unspecified artificial shoulder joint: Secondary | ICD-10-CM | POA: Diagnosis not present

## 2017-01-27 DIAGNOSIS — L97322 Non-pressure chronic ulcer of left ankle with fat layer exposed: Secondary | ICD-10-CM | POA: Diagnosis not present

## 2017-01-27 DIAGNOSIS — R0602 Shortness of breath: Secondary | ICD-10-CM | POA: Diagnosis not present

## 2017-01-27 DIAGNOSIS — M129 Arthropathy, unspecified: Secondary | ICD-10-CM | POA: Diagnosis not present

## 2017-01-27 DIAGNOSIS — E069 Thyroiditis, unspecified: Secondary | ICD-10-CM | POA: Diagnosis not present

## 2017-01-27 DIAGNOSIS — M65072 Abscess of tendon sheath, left ankle and foot: Secondary | ICD-10-CM | POA: Diagnosis not present

## 2017-01-27 DIAGNOSIS — J45909 Unspecified asthma, uncomplicated: Secondary | ICD-10-CM | POA: Diagnosis not present

## 2017-01-27 DIAGNOSIS — L0390 Cellulitis, unspecified: Secondary | ICD-10-CM | POA: Diagnosis not present

## 2017-01-27 DIAGNOSIS — I10 Essential (primary) hypertension: Secondary | ICD-10-CM | POA: Diagnosis not present

## 2017-01-27 DIAGNOSIS — E1143 Type 2 diabetes mellitus with diabetic autonomic (poly)neuropathy: Secondary | ICD-10-CM | POA: Diagnosis not present

## 2017-01-27 DIAGNOSIS — L02611 Cutaneous abscess of right foot: Secondary | ICD-10-CM | POA: Diagnosis not present

## 2017-02-01 ENCOUNTER — Ambulatory Visit: Payer: Self-pay

## 2017-02-01 NOTE — Telephone Encounter (Signed)
Pt. called and reported falling about 5 days ago, and scraped left elbow area on pavement; described all the skin as being scraped off.  Reported a bloody drainage.  Unable to describe appearance of wound, due to being covered with a dressing at this time.  Stated she has been cleaning with NS and covering with clean gauze dressings daily.  Denies fever.  Unsure if there is swelling when questioned.  Pt. Voiced concern with being diabetic, and concern for infection.  Appt. Given  for 11/13 @ [place].  Care advice given per protocol.  Pt. Verb. Understanding.  Agrees with plan.         Reason for Disposition . Can't move injured arm normally (bend or straighten completely)  Answer Assessment - Initial Assessment Questions 1. MECHANISM: "How did the injury happen?"     Fell last Wednesday; had injury to left elbow; skin was scraped off  2. ONSET: "When did the injury happen?" (Minutes or hours ago)      Last week on Wednesday, 11/7 3. LOCATION: "Where is the injury located?"      Left elbow 4. APPEARANCE of INJURY: "What does the injury look like?"      Bloody drainage, very sore, unsure about swelling, unsure about appearance due to bandage in place.   5. SEVERITY: "Can you use the arm normally?"      Very sore, and hard to move it. 6. SWELLING or BRUISING: "is there any swelling or bruising?" If so, ask: "How large is it? (e.g., inches, centimeters)      Entire area surrounding elbow  7. PAIN: "Is there pain?" If so, ask: "How bad is the pain?"    (Scale 1-10; or mild, moderate, severe)     moderate 8. TETANUS: For any breaks in the skin, ask: "When was the last tetanus booster?"     Reported rec'd Tetanus booster within past few years.  9. OTHER SYMPTOMS: "Do you have any other symptoms?"  (e.g., numbness in hand)     No fever/ chills ; no change in sensation of left arm 10. PREGNANCY: "Is there any chance you are pregnant?" "When was your last menstrual period?"        no  Protocols used: ARM INJURY-A-AH

## 2017-02-02 ENCOUNTER — Encounter: Payer: Self-pay | Admitting: Internal Medicine

## 2017-02-02 ENCOUNTER — Other Ambulatory Visit: Payer: Self-pay | Admitting: Family Medicine

## 2017-02-02 ENCOUNTER — Ambulatory Visit (INDEPENDENT_AMBULATORY_CARE_PROVIDER_SITE_OTHER): Payer: PPO | Admitting: Internal Medicine

## 2017-02-02 VITALS — BP 150/86 | HR 58 | Temp 97.8°F | Wt 258.2 lb

## 2017-02-02 DIAGNOSIS — F329 Major depressive disorder, single episode, unspecified: Secondary | ICD-10-CM

## 2017-02-02 DIAGNOSIS — S59902A Unspecified injury of left elbow, initial encounter: Secondary | ICD-10-CM

## 2017-02-02 DIAGNOSIS — W19XXXA Unspecified fall, initial encounter: Secondary | ICD-10-CM

## 2017-02-02 DIAGNOSIS — F419 Anxiety disorder, unspecified: Secondary | ICD-10-CM

## 2017-02-02 MED ORDER — CEPHALEXIN 500 MG PO CAPS: 500.0000 mg | ORAL_CAPSULE | Freq: Three times a day (TID) | ORAL | 0 refills | Status: DC

## 2017-02-02 MED ORDER — ONDANSETRON HCL 4 MG PO TABS: 4.0000 mg | ORAL_TABLET | Freq: Three times a day (TID) | ORAL | 0 refills | Status: DC | PRN

## 2017-02-02 MED ORDER — TRIAMCINOLONE ACETONIDE 0.1 % EX CREA: 1.0000 "application " | TOPICAL_CREAM | Freq: Two times a day (BID) | CUTANEOUS | 1 refills | Status: DC | PRN

## 2017-02-02 NOTE — Patient Instructions (Signed)
Please try the cream on your skin. If it gets worse, start the antibiotic (cephalexin).

## 2017-02-02 NOTE — Telephone Encounter (Signed)
Refilled for 30 days 

## 2017-02-02 NOTE — Telephone Encounter (Signed)
Last filled 01/02/17  Next office visit 02/25/17 Last office visit 11/27/16

## 2017-02-02 NOTE — Progress Notes (Signed)
Subjective:    Patient ID: [name], female    DOB: April 27, 1950, 66 y.o.   MRN: [id number]  HPI Here due to injury to left elbow   [place] due to ongoing problems after Achilles surgery on right Got foot hung up on hole in asphalt 6 days ago Went down and scraped up arm Got home and cleaned it up and put on bandages Now feels it is like chronic skin problem she has had in past  Some pain Some blood but no other discharge (though some stuff on the bandages when she takes it off) Told by son and friends to get this checked Does seem some worse in past couple of days  Current Outpatient Medications on File Prior to Visit  Medication Sig Dispense Refill  . albuterol (PROVENTIL HFA;VENTOLIN HFA) 108 (90 Base) MCG/ACT inhaler Inhale 1-2 puffs into the lungs every 4 (four) hours as needed for wheezing or shortness of breath. (Patient taking differently: Inhale 2 puffs into the lungs every 4 (four) hours as needed for wheezing or shortness of breath. ) 8 g 2  . albuterol (PROVENTIL) (2.5 MG/3ML) 0.083% nebulizer solution Take 3 mLs (2.5 mg total) by nebulization every 6 (six) hours as needed for wheezing or shortness of breath. 150 mL 1  . ALPRAZolam (XANAX) 0.5 MG tablet TAKE ONE TABLET BY MOUTH TWICE DAILY AS NEEDED 60 tablet 0  . buPROPion (WELLBUTRIN XL) 300 MG 24 hr tablet TAKE 1 TABLET BY MOUTH DAILY 90 tablet 0  . calcium carbonate (OSCAL) 1500 (600 Ca) MG TABS tablet Take 600 mg of elemental calcium by mouth 2 (two) times daily with a meal.    . cyanocobalamin 500 MCG tablet Take 500 mcg by mouth daily.    . dabigatran (PRADAXA) 150 MG CAPS capsule TAKE 1 CAPSULE BY MOUTH TWICE DAILY 180 capsule 3  . Dexlansoprazole 30 MG capsule Take 2 capsules (60 mg total) by mouth daily. 180 capsule 1  . DULoxetine (CYMBALTA) 60 MG capsule Take 1 capsule (60 mg total) by mouth daily. 90 capsule 0  . estradiol (ESTRACE) 0.1 MG/GM vaginal cream Place 1 Applicatorful vaginally 3 (three) times a week.  42.5 g 12  . gabapentin (NEURONTIN) 800 MG tablet Take 1 tablet (800 mg total) by mouth 4 (four) times daily. 360 tablet 0  . HYDROcodone-acetaminophen (NORCO/VICODIN) 5-325 MG tablet Take 1 tablet by mouth every 8 (eight) hours as needed for severe pain. 90 tablet 0  . [START ON 02/11/2017] HYDROcodone-acetaminophen (NORCO/VICODIN) 5-325 MG tablet Take 1 tablet by mouth every 8 (eight) hours as needed for severe pain. 90 tablet 0  . levocetirizine (XYZAL) 5 MG tablet Take 1 tablet (5 mg total) by mouth every evening. 90 tablet 3  . levothyroxine (SYNTHROID, LEVOTHROID) 150 MCG tablet TAKE ONE TABLET BY MOUTH EVERY MORNING 90 tablet 3  . meloxicam (MOBIC) 15 MG tablet Take 15 mg by mouth daily.     . metFORMIN (GLUCOPHAGE) 1000 MG tablet TAKE ONE TABLET BY MOUTH TWICE DAILY (Patient taking differently: TAKE ONE TABLET IN THE MORNING AND HALF TABLET AT NIGHT) 180 tablet 1  . metoprolol tartrate (LOPRESSOR) 25 MG tablet TAKE ONE TABLET TWICE DAILY 180 tablet 3  . mirabegron ER (MYRBETRIQ) 25 MG TB24 tablet Take 1 tablet (25 mg total) by mouth daily. 30 tablet 12  . montelukast (SINGULAIR) 10 MG tablet TAKE ONE TABLET BY MOUTH EVERY DAY (Patient taking differently: TAKE ONE TABLET BY MOUTH EVERY evening) 90 tablet 3  .  Multiple Vitamin (MULTIVITAMIN WITH MINERALS) TABS tablet Take 1 tablet by mouth daily.    Marland Kitchen oxybutynin (DITROPAN XL) 15 MG 24 hr tablet Take 1 tablet (15 mg total) by mouth at bedtime. 90 tablet 3  . quinapril (ACCUPRIL) 20 MG tablet TAKE ONE TABLET EVERY DAY 90 tablet 3   No current facility-administered medications on file prior to visit.     Allergies  Allergen Reactions  . Penicillins Hives, Shortness Of Breath and Swelling    Facial swelling Has patient had a PCN reaction causing immediate rash, facial/tongue/throat swelling, SOB or lightheadedness with hypotension: Yes Has patient had a PCN reaction causing severe rash involving mucus membranes or skin necrosis: Yes Has  patient had a PCN reaction that required hospitalization No Has patient had a PCN reaction occurring within the last 10 years: No If all of the above answers are "NO", then may proceed with Cephalosporin use.   . Aspirin Hives  . Morphine And Related Other (See Comments)    Patient becomes very confused  . Terramycin [Oxytetracycline] Hives    Past Medical History:  Diagnosis Date  . Abnormal antibody titer   . Anxiety and depression   . Arthritis   . Asthma   . Cystocele   . Depression   . Diabetes ([place])   . DVT (deep venous thrombosis) ([facility])    Righ calf  . Dysrhythmia   . Edema   . GERD (gastroesophageal reflux disease)   . Heart murmur    Echocardiogram June 2015: Mild MR (possible vegetation seen on TTE, not seen on TEE), normal LV size with moderate concentric LVH. Normal function EF 60-65%. Normal diastolic function. Mild LA dilation.  Marland Kitchen History of IBS   . History of methicillin resistant staphylococcus aureus (MRSA) 2008  . Hyperlipidemia   . Hypertension   . Hypothyroidism   . Insomnia   . Kidney stone    kidney stones with lithotripsy  . Neuropathy involving both lower extremities   . Obesity, Class II, BMI 35-39.9, with comorbidity   . Paroxysmal atrial fibrillation ([place]) 2007   In June 2015, Cardiac Event Monitor: Mostly SR/sinus arrhythmia with PVCs that are frequent. Short bursts of A. fib lasting several minutes;; CHA2DS2-VASc Score = 5 (age, Female, PVD, DM, HTN)  . Peripheral vascular disease ([place])   . Sleep apnea    use C-PAP  . Urinary incontinence   . Venous stasis dermatitis of both lower extremities     Past Surgical History:  Procedure Laterality Date  . ANKLE SURGERY Right   . arm surgery     right    . CHOLECYSTECTOMY    . COLONOSCOPY    . HERNIA REPAIR     umbilical  . HIATAL HERNIA REPAIR    . JOINT REPLACEMENT     left knee replacement  . KIDNEY SURGERY Right    kidney stones  . LITHOTRIPSY    . NM [facility] HX)   February 2017   Likely breast attenuation. LOW RISK study. Normal EF 55-60%.  . removal of left hematoma Left    leg  . SLEEVE GASTROPLASTY    . TONSILLECTOMY    . TOTAL KNEE ARTHROPLASTY    . TRANSESOPHAGEAL ECHOCARDIOGRAM  08/08/2013   Mild LVH, EF 60-65%. Moderate LA dilation and mild RA dilation. Mild MR with no evidence of stenosis and no evidence of endocarditis. A false chordae is noted.  . TRANSTHORACIC ECHOCARDIOGRAM  08/03/2013   Mild-Moderate concentric LVH, EF 60-65%.  Normal diastolic function. Mild LA dilation. Mild MR with possible vegetation  - not confirmed on TEE   . UPPER GI ENDOSCOPY    . VAGINAL HYSTERECTOMY      Family History  Problem Relation Age of Onset  . Skin cancer Father   . Diabetes Father   . Hypertension Father   . Peripheral vascular disease Father   . Cancer Father   . Cerebral aneurysm Father   . Alcohol abuse Father   . Varicose Veins Mother   . Kidney disease Mother   . Arthritis Mother   . Mental illness Sister   . Cancer Maternal Aunt        breast  . Breast cancer Maternal Aunt   . Arthritis Maternal Grandmother   . Hypertension Maternal Grandmother   . Diabetes Maternal Grandmother   . Arthritis Maternal Grandfather   . Heart disease Maternal Grandfather   . Hypertension Maternal Grandfather   . Arthritis Paternal Grandmother   . Hypertension Paternal Grandmother   . Diabetes Paternal Grandmother   . Arthritis Paternal Grandfather   . Hypertension Paternal Grandfather   . Bladder Cancer Neg Hx   . Kidney cancer Neg Hx     Social History   Socioeconomic History  . Marital status: Married    Spouse name: Not on file  . Number of children: Not on file  . Years of education: Not on file  . Highest education level: Not on file  Social Needs  . Financial resource strain: Not on file  . Food insecurity - worry: Not on file  . Food insecurity - inability: Not on file  . Transportation needs - medical: Not on file  .  Transportation needs - non-medical: Not on file  Occupational History  . Not on file  Tobacco Use  . Smoking status: Never Smoker  . Smokeless tobacco: Never Used  Substance and Sexual Activity  . Alcohol use: No    Alcohol/week: 0.0 oz  . Drug use: No  . Sexual activity: No  Other Topics Concern  . Not on file  Social History Narrative   She is currently married -- for 28 years. Does not work. Does not smoke or take alcohol. She never smoked. She exercises at least 3 days a week since before her gastric surgery.   Marital status reviewed in history of present illness.   Review of Systems Some chronic rash on arms---?eczema Hasn't felt sick No apparent fever Some nausea and hard to eat. Still on dexilant. Asked her to discuss with Dr Gustavo Lah    Objective:   Physical Exam  Musculoskeletal:  Good ROM of left elbow Mild restriction of ROM in shoulder  Skin:  Bruise along extensor left forearm Small open area with clean eschar over the left elbow. Also red scaling areas that are slightly warm and tender around this          Assessment & Plan:

## 2017-02-02 NOTE — Assessment & Plan Note (Signed)
Mild left shoulder pain Skin issues as well Appears to be more eczematous but may be early cellulitis Will try TAC--- if worsens, start cephalexin (has tolerated this in past)

## 2017-02-03 DIAGNOSIS — G4733 Obstructive sleep apnea (adult) (pediatric): Secondary | ICD-10-CM | POA: Diagnosis not present

## 2017-02-03 NOTE — Telephone Encounter (Signed)
rx has been printed, signed and faxed.  

## 2017-02-10 DIAGNOSIS — G4733 Obstructive sleep apnea (adult) (pediatric): Secondary | ICD-10-CM | POA: Diagnosis not present

## 2017-02-15 DIAGNOSIS — L03119 Cellulitis of unspecified part of limb: Secondary | ICD-10-CM | POA: Diagnosis not present

## 2017-02-15 DIAGNOSIS — L97511 Non-pressure chronic ulcer of other part of right foot limited to breakdown of skin: Secondary | ICD-10-CM | POA: Diagnosis not present

## 2017-02-15 DIAGNOSIS — L02619 Cutaneous abscess of unspecified foot: Secondary | ICD-10-CM | POA: Diagnosis not present

## 2017-02-16 ENCOUNTER — Other Ambulatory Visit: Payer: Self-pay | Admitting: Nurse Practitioner

## 2017-02-16 ENCOUNTER — Other Ambulatory Visit: Payer: Self-pay | Admitting: Family Medicine

## 2017-02-16 DIAGNOSIS — F418 Other specified anxiety disorders: Secondary | ICD-10-CM

## 2017-02-17 ENCOUNTER — Encounter: Payer: PPO | Admitting: Nurse Practitioner

## 2017-02-17 DIAGNOSIS — L97322 Non-pressure chronic ulcer of left ankle with fat layer exposed: Secondary | ICD-10-CM | POA: Diagnosis not present

## 2017-02-18 ENCOUNTER — Ambulatory Visit: Payer: PPO | Admitting: Nurse Practitioner

## 2017-02-18 ENCOUNTER — Ambulatory Visit: Payer: PPO | Attending: Nurse Practitioner | Admitting: Nurse Practitioner

## 2017-02-18 ENCOUNTER — Encounter: Payer: Self-pay | Admitting: Nurse Practitioner

## 2017-02-18 ENCOUNTER — Other Ambulatory Visit: Payer: Self-pay

## 2017-02-18 VITALS — BP 127/57 | HR 52 | Temp 98.7°F | Resp 18 | Ht 65.5 in | Wt 258.0 lb

## 2017-02-18 DIAGNOSIS — I872 Venous insufficiency (chronic) (peripheral): Secondary | ICD-10-CM | POA: Diagnosis not present

## 2017-02-18 DIAGNOSIS — R32 Unspecified urinary incontinence: Secondary | ICD-10-CM | POA: Insufficient documentation

## 2017-02-18 DIAGNOSIS — E039 Hypothyroidism, unspecified: Secondary | ICD-10-CM | POA: Diagnosis not present

## 2017-02-18 DIAGNOSIS — K589 Irritable bowel syndrome without diarrhea: Secondary | ICD-10-CM | POA: Insufficient documentation

## 2017-02-18 DIAGNOSIS — Z79891 Long term (current) use of opiate analgesic: Secondary | ICD-10-CM | POA: Diagnosis not present

## 2017-02-18 DIAGNOSIS — G894 Chronic pain syndrome: Secondary | ICD-10-CM | POA: Diagnosis not present

## 2017-02-18 DIAGNOSIS — Z886 Allergy status to analgesic agent status: Secondary | ICD-10-CM | POA: Diagnosis not present

## 2017-02-18 DIAGNOSIS — E1142 Type 2 diabetes mellitus with diabetic polyneuropathy: Secondary | ICD-10-CM | POA: Diagnosis not present

## 2017-02-18 DIAGNOSIS — E785 Hyperlipidemia, unspecified: Secondary | ICD-10-CM | POA: Diagnosis not present

## 2017-02-18 DIAGNOSIS — Z7901 Long term (current) use of anticoagulants: Secondary | ICD-10-CM | POA: Insufficient documentation

## 2017-02-18 DIAGNOSIS — Z880 Allergy status to penicillin: Secondary | ICD-10-CM | POA: Insufficient documentation

## 2017-02-18 DIAGNOSIS — E1351 Other specified diabetes mellitus with diabetic peripheral angiopathy without gangrene: Secondary | ICD-10-CM

## 2017-02-18 DIAGNOSIS — Z888 Allergy status to other drugs, medicaments and biological substances status: Secondary | ICD-10-CM | POA: Insufficient documentation

## 2017-02-18 DIAGNOSIS — E1151 Type 2 diabetes mellitus with diabetic peripheral angiopathy without gangrene: Secondary | ICD-10-CM | POA: Diagnosis not present

## 2017-02-18 DIAGNOSIS — Z9889 Other specified postprocedural states: Secondary | ICD-10-CM | POA: Diagnosis not present

## 2017-02-18 DIAGNOSIS — Z9049 Acquired absence of other specified parts of digestive tract: Secondary | ICD-10-CM | POA: Insufficient documentation

## 2017-02-18 DIAGNOSIS — I10 Essential (primary) hypertension: Secondary | ICD-10-CM | POA: Diagnosis not present

## 2017-02-18 DIAGNOSIS — G5603 Carpal tunnel syndrome, bilateral upper limbs: Secondary | ICD-10-CM | POA: Insufficient documentation

## 2017-02-18 DIAGNOSIS — Z87442 Personal history of urinary calculi: Secondary | ICD-10-CM | POA: Diagnosis not present

## 2017-02-18 DIAGNOSIS — Z7984 Long term (current) use of oral hypoglycemic drugs: Secondary | ICD-10-CM | POA: Diagnosis not present

## 2017-02-18 DIAGNOSIS — Z885 Allergy status to narcotic agent status: Secondary | ICD-10-CM | POA: Insufficient documentation

## 2017-02-18 DIAGNOSIS — F418 Other specified anxiety disorders: Secondary | ICD-10-CM | POA: Insufficient documentation

## 2017-02-18 DIAGNOSIS — Z79899 Other long term (current) drug therapy: Secondary | ICD-10-CM | POA: Insufficient documentation

## 2017-02-18 DIAGNOSIS — Z96659 Presence of unspecified artificial knee joint: Secondary | ICD-10-CM | POA: Insufficient documentation

## 2017-02-18 DIAGNOSIS — I480 Paroxysmal atrial fibrillation: Secondary | ICD-10-CM | POA: Diagnosis not present

## 2017-02-18 DIAGNOSIS — M4804 Spinal stenosis, thoracic region: Secondary | ICD-10-CM | POA: Diagnosis not present

## 2017-02-18 MED ORDER — DULOXETINE HCL 60 MG PO CPEP: 60.0000 mg | ORAL_CAPSULE | Freq: Every day | ORAL | 0 refills | Status: DC

## 2017-02-18 MED ORDER — HYDROCODONE-ACETAMINOPHEN 5-325 MG PO TABS: 1.0000 | ORAL_TABLET | Freq: Three times a day (TID) | ORAL | 0 refills | Status: DC | PRN

## 2017-02-18 MED ORDER — GABAPENTIN 800 MG PO TABS: 800.0000 mg | ORAL_TABLET | Freq: Four times a day (QID) | ORAL | 0 refills | Status: DC

## 2017-02-18 NOTE — Progress Notes (Signed)
Nursing Pain Medication Assessment:  Safety precautions to be maintained throughout the outpatient stay will include: orient to surroundings, keep bed in low position, maintain call bell within reach at all times, provide assistance with transfer out of bed and ambulation.  Medication Inspection Compliance: Pill count conducted under aseptic conditions, in front of the patient. Neither the pills nor the bottle was removed from the patient's sight at any time. Once count was completed pills were immediately returned to the patient in their original bottle.  Medication: See above Pill/Patch Count: 60 of 90 pills remain Pill/Patch Appearance: Markings consistent with prescribed medication Bottle Appearance: Standard pharmacy container. Clearly labeled. Filled Date: 95 / 23/ 2018 Last Medication intake:  Today

## 2017-02-18 NOTE — Patient Instructions (Addendum)
____________________________________________________________________________________________  Medication Rules  Applies to: All patients receiving prescriptions (written or electronic).  Pharmacy of record: Pharmacy where electronic prescriptions will be sent. If written prescriptions are taken to a different pharmacy, please inform the nursing staff. The pharmacy listed in the electronic medical record should be the one where you would like electronic prescriptions to be sent.  Prescription refills: Only during scheduled appointments. Applies to both, written and electronic prescriptions.  NOTE: The following applies primarily to controlled substances (Opioid* Pain Medications).   Patient's responsibilities: 1. Pain Pills: Bring all pain pills to every appointment (except for procedure appointments). 2. Pill Bottles: Bring pills in original pharmacy bottle. Always bring newest bottle. Bring bottle, even if empty. 3. Medication refills: You are responsible for knowing and keeping track of what medications you need refilled. The day before your appointment, write a list of all prescriptions that need to be refilled. Bring that list to your appointment and give it to the admitting nurse. Prescriptions will be written only during appointments. If you forget a medication, it will not be "Called in", "Faxed", or "electronically sent". You will need to get another appointment to get these prescribed. 4. Prescription Accuracy: You are responsible for carefully inspecting your prescriptions before leaving our office. Have the discharge nurse carefully go over each prescription with you, before taking them home. Make sure that your name is accurately spelled, that your address is correct. Check the name and dose of your medication to make sure it is accurate. Check the number of pills, and the written instructions to make sure they are clear and accurate. Make sure that you are given enough medication to  last until your next medication refill appointment. 5. Taking Medication: Take medication as prescribed. Never take more pills than instructed. Never take medication more frequently than prescribed. Taking less pills or less frequently is permitted and encouraged, when it comes to controlled substances (written prescriptions).  6. Inform other Doctors: Always inform, all of your healthcare providers, of all the medications you take. 7. Pain Medication from other Providers: You are not allowed to accept any additional pain medication from any other Doctor or Healthcare provider. There are two exceptions to this rule. (see below) In the event that you require additional pain medication, you are responsible for notifying us, as stated below. 8. Medication Agreement: You are responsible for carefully reading and following our Medication Agreement. This must be signed before receiving any prescriptions from our practice. Safely store a copy of your signed Agreement. Violations to the Agreement will result in no further prescriptions. (Additional copies of our Medication Agreement are available upon request.) 9. Laws, Rules, & Regulations: All patients are expected to follow all Federal and State Laws, Statutes, Rules, & Regulations. Ignorance of the Laws does not constitute a valid excuse. The use of any illegal substances is prohibited. 10. Adopted CDC guidelines & recommendations: Target dosing levels will be at or below 60 MME/day. Use of benzodiazepines** is not recommended.  Exceptions: There are only two exceptions to the rule of not receiving pain medications from other Healthcare Providers. 1. Exception #1 (Emergencies): In the event of an emergency (i.e.: accident requiring emergency care), you are allowed to receive additional pain medication. However, you are responsible for: As soon as you are able, call our office [phone], at any time of the day or night, and leave a message stating your  name, the date and nature of the emergency, and the name and dose of the medication   prescribed. In the event that your call is answered by a member of our staff, make sure to document and save the date, time, and the name of the person that took your information.  2. Exception #2 (Planned Surgery): In the event that you are scheduled by another doctor or dentist to have any type of surgery or procedure, you are allowed (for a period no longer than 30 days), to receive additional pain medication, for the acute post-op pain. However, in this case, you are responsible for picking up a copy of our "Post-op Pain Management for Surgeons" handout, and giving it to your surgeon or dentist. This document is available at our office, and does not require an appointment to obtain it. Simply go to our office during business hours (Monday-Thursday from 8:00 AM to 4:00 PM) (Friday 8:00 AM to 12:00 Noon) or if you have a scheduled appointment with [place], prior to your surgery, and ask for it by name. In addition, you will need to provide [place] with your name, name of your surgeon, type of surgery, and date of procedure or surgery.  *Opioid medications include: morphine, codeine, oxycodone, oxymorphone, hydrocodone, hydromorphone, meperidine, tramadol, tapentadol, buprenorphine, fentanyl, methadone. **Benzodiazepine medications include: diazepam (Valium), alprazolam (Xanax), clonazepam (Klonopine), lorazepam (Ativan), clorazepate (Tranxene), chlordiazepoxide (Librium), estazolam (Prosom), oxazepam (Serax), temazepam (Restoril), triazolam (Halcion)  ____________________________________________________________________________________________   BMI Assessment: Estimated body mass index is 42.28 kg/m as calculated from the following:   Height as of this encounter: 5' 5.5" (1.664 m).   Weight as of this encounter: 258 lb (117 kg).  BMI interpretation table: BMI level Category Range association with higher incidence of chronic  pain  <18 kg/m2 Underweight   18.5-24.9 kg/m2 Ideal body weight   25-29.9 kg/m2 Overweight Increased incidence by 20%  30-34.9 kg/m2 Obese (Class I) Increased incidence by 68%  35-39.9 kg/m2 Severe obesity (Class II) Increased incidence by 136%  >40 kg/m2 Extreme obesity (Class III) Increased incidence by 254%   BMI Readings from Last 4 Encounters:  02/18/17 42.28 kg/m  02/02/17 42.32 kg/m  01/11/17 43.26 kg/m  12/03/16 43.36 kg/m   Wt Readings from Last 4 Encounters:  02/18/17 258 lb (117 kg)  02/02/17 258 lb 4 oz (117.1 kg)  01/11/17 264 lb (119.7 kg)  12/03/16 264 lb 9.6 oz (120 kg)   Prescriptions for Cymbalta and Gabapentin were sent to your pharmacy. You were given 3 prescriptions for Hydrocodone today.

## 2017-02-18 NOTE — Progress Notes (Signed)
Patient's Name: [name]  MRN: [id number]  Referring Provider: [name], DO  DOB: Dec 27, 1950  PCP: Leone Haven, MD  DOS: 02/18/2017  Note by: Vevelyn Francois NP  Service setting: Ambulatory outpatient  Specialty: Interventional Pain Management  Location: [facility] (AMB) Pain Management Facility    Patient type: Established    Primary Reason(s) for Visit: Encounter for prescription drug management. (Level of risk: moderate)  CC: Foot Pain (bilateral)  HPI  Ms. Kirchhoff is a 66 y.o. year old, female patient, who comes today for a medication management evaluation. She has Paroxysmal atrial fibrillation ([place]); CHA2DS2-Vasc Score 5. On Pradaxa; Essential hypertension; Peripheral vascular disease due to secondary diabetes mellitus ([place]); Apnea, sleep; DM (diabetes mellitus) type II, controlled, with peripheral vascular disorder ([place]); Diabetic peripheral neuropathy associated with type 2 diabetes mellitus ([place]); Asthma, moderate persistent, well-controlled; Hypothyroidism, adult; Anxiety and depression; Bladder cystocele; Gastro-esophageal reflux disease without esophagitis; Mixed incontinence; Atrophic vaginitis; Opiate use (15 MME/Day); Other shoulder lesions (Right); Neuropathy of right lateral femoral cutaneous nerve; Morbid obesity with BMI of 40.0-44.9, adult ([place]); Chronic prescription benzodiazepine use; Osteoarthritis, multiple sites; Status post reverse total shoulder replacement; Chronic pain syndrome; Long term current use of opiate analgesic; Chronic right shoulder pain; Abscess of tendon of left foot; Anemia; Asthma; Carpal tunnel syndrome; Cervical radiculitis; Cubital tunnel syndrome on right; Impingement syndrome of shoulder region; Pain in joint involving ankle and foot; Obstructive sleep apnea of adult; Spinal stenosis of thoracic region; and Fall with injury on their problem list. Her primarily concern today is the Foot Pain (bilateral)  Pain Assessment: Location: Right, Left  Foot Radiating: does not radiate Onset: More than a month ago Duration: Chronic pain Quality: Aching, Discomfort, Moaning Severity: 9 /10 (self-reported pain score)  Note: Reported level is compatible with observation. Clinically the patient looks like a 3/10 A 3/10 is viewed as "Moderate" and described as significantly interfering with activities of daily living (ADL). It becomes difficult to feed, bathe, get dressed, get on and off the toilet or to perform personal hygiene functions. Difficult to get in and out of bed or a chair without assistance. Very distracting. With effort, it can be ignored when deeply involved in activities. Information on the proper use of the pain scale provided to the patient today. When using our objective Pain Scale, levels between 6 and 10/10 are said to belong in an emergency room, as it progressively worsens from a 6/10, described as severely limiting, requiring emergency care not usually available at an outpatient pain management facility. At a 6/10 level, communication becomes difficult and requires great effort. Assistance to reach the emergency department may be required. Facial flushing and profuse sweating along with potentially dangerous increases in heart rate and blood pressure will be evident. Effect on ADL: walking, sleeping Timing: Constant Modifying factors: medications  Ms. Co was last scheduled for an appointment on 02/16/2017 for medication management. During today's appointment we reviewed Ms. Carsey's chronic pain status, as well as her outpatient medication regimen. She continues to have left foot ulcers. She is being followed by Dr [name]. She states that he performed and I&D on yesterday in the office. She states that she was going to ask for additional pain medication however she did not. She is requesting a change in her current regimen.   The patient  reports that she does not use drugs. Her body mass index is 42.28 kg/m.  Further  details on both, my assessment(s), as well as the proposed treatment plan,  please see below.  Controlled Substance Pharmacotherapy Assessment REMS (Risk Evaluation and Mitigation Strategy)  Analgesic:Hydrocodone/APAP 5/325 one tablet every 8 hours (15 mg/day) MME/day:15 mg/day    Ignatius Specking, RN  02/18/2017 12:20 PM  Sign at close encounter Nursing Pain Medication Assessment:  Safety precautions to be maintained throughout the outpatient stay will include: orient to surroundings, keep bed in low position, maintain call bell within reach at all times, provide assistance with transfer out of bed and ambulation.  Medication Inspection Compliance: Pill count conducted under aseptic conditions, in front of the patient. Neither the pills nor the bottle was removed from the patient's sight at any time. Once count was completed pills were immediately returned to the patient in their original bottle.  Medication: See above Pill/Patch Count: 60 of 90 pills remain Pill/Patch Appearance: Markings consistent with prescribed medication Bottle Appearance: Standard pharmacy container. Clearly labeled. Filled Date: 52 / 23/ 2018 Last Medication intake:  Today   Pharmacokinetics: Liberation and absorption (onset of action): WNL Distribution (time to peak effect): WNL Metabolism and excretion (duration of action): WNL         Pharmacodynamics: Desired effects: Analgesia: Ms. Homer reports >50% benefit. Functional ability: Patient reports that medication allows her to accomplish basic ADLs Clinically meaningful improvement in function (CMIF): Sustained CMIF goals met Perceived effectiveness: Described as relatively effective, allowing for increase in activities of daily living (ADL) Undesirable effects: Side-effects or Adverse reactions: None reported Monitoring: [place] PMP: Online review of the past 29-monthperiod conducted. Compliant with practice rules and regulations Last UDS on record: No  results found for: SUMMARY UDS interpretation: Compliant          Medication Assessment Form: Reviewed. Patient indicates being compliant with therapy Treatment compliance: Compliant Risk Assessment Profile: Aberrant behavior: See prior evaluations. None observed or detected today Comorbid factors increasing risk of overdose: See prior notes. No additional risks detected today Risk of substance use disorder (SUD): Low Opioid Risk Tool - 02/18/17 1217      Family History of Substance Abuse   Alcohol  Negative    Illegal Drugs  Negative    Rx Drugs  Negative      Personal History of Substance Abuse   Alcohol  Negative    Illegal Drugs  Negative    Rx Drugs  Negative      Total Score   Opioid Risk Tool Scoring  0    Opioid Risk Interpretation  Low Risk      ORT Scoring interpretation table:  Score <3 = Low Risk for SUD  Score between 4-7 = Moderate Risk for SUD  Score >8 = High Risk for Opioid Abuse   Risk Mitigation Strategies:  Patient Counseling: Covered Patient-Prescriber Agreement (PPA): Present and active  Notification to other healthcare providers: Done  Pharmacologic Plan: No change in therapy, at this time  Laboratory Chemistry  Inflammation Markers (CRP: Acute Phase) (ESR: Chronic Phase) Lab Results  Component Value Date   CRP 14.7 (H) 09/08/2016   ESRSEDRATE 58 (H) 09/08/2016                 Rheumatology Markers No results found for: RF, ANA, LRush Barer LY[facility]IGGIGMAB, [facility]             Renal Function Markers Lab Results  Component Value Date   BUN 20 01/07/2017   CREATININE 0.88 01/07/2017   GFRAA >60 01/07/2017   GFRNONAA >60 01/07/2017  Hepatic Function Markers Lab Results  Component Value Date   AST 11 06/09/2016   ALT 9 06/09/2016   ALBUMIN 4.2 06/09/2016   ALKPHOS 83 06/09/2016   LIPASE 120 08/12/2012                 Electrolytes Lab Results  Component Value Date   NA 140 01/07/2017   K 4.7 01/07/2017    CL 105 01/07/2017   CALCIUM 8.6 (L) 01/07/2017   MG 1.8 04/17/2015   PHOS 3.4 08/12/2012                 Neuropathy Markers Lab Results  Component Value Date   HGBA1C 6.9 (H) 06/09/2016                 Bone Pathology Markers No results found for: [place], [id number], [id number], [id number], 25OHVITD1, 25OHVITD2, 25OHVITD3, TESTOFREE, TESTOSTERONE               Coagulation Parameters Lab Results  Component Value Date   INR 1.18 09/26/2015   LABPROT 15.2 (H) 09/26/2015   APTT 42 (H) 06/24/2015   PLT 261 09/30/2016                 Cardiovascular Markers Lab Results  Component Value Date   BNP 216.0 (H) 09/30/2016   TROPONINI <0.03 09/30/2016   HGB 11.8 (L) 09/30/2016   HCT 34.6 (L) 09/30/2016                 CA Markers No results found for: CEA, CA125, LABCA2               Note: Lab results reviewed.  Recent Diagnostic Imaging Results  CT Angio Chest PE W and/or Wo Contrast CLINICAL DATA:  Hypoxia and shortness of breath.  EXAM: CT ANGIOGRAPHY CHEST WITH CONTRAST  TECHNIQUE: Multidetector CT imaging of the chest was performed using the standard protocol during bolus administration of intravenous contrast. Multiplanar CT image reconstructions and MIPs were obtained to evaluate the vascular anatomy.  CONTRAST:  75 cc Isovue 370 IV  COMPARISON:  Chest CT PE protocol 4 weeks prior 09/05/2016. Additional prior exams reviewed.  FINDINGS: Cardiovascular: There are no filling defects within the pulmonary arteries to suggest pulmonary embolus. Mild multi chamber cardiomegaly. Mitral annulus calcifications again seen. Tortuous thoracic aorta without aneurysm or dissection. No pericardial effusion.  Mediastinum/Nodes: No mediastinal or hilar adenopathy. The esophagus is decompressed, small hiatal hernia. Trachea and mainstem bronchi are patent.  Lungs/Pleura: Again seen eventration of right hemidiaphragm with adjacent atelectasis of the right middle and lower  lobes. Minimal dependent left lower lobe atelectasis. No pulmonary edema, pleural effusion or focal consolidation. No pleural fluid.  Upper Abdomen: The liver is prominent size. Postcholecystectomy. Post gastric surgery. Bilateral fat containing adrenal masses consist with myelolipomas, unchanged from 11/2014 MRI.  Musculoskeletal: Degenerative change in the spine. Mild compression deformity at the T12 is chronic and unchanged from July 2017 chest CT. No acute abnormality.  Review of the MIP images confirms the above findings.  IMPRESSION: 1. No pulmonary embolus or acute abnormality. 2. Stable chronic elevation of right hemidiaphragm with middle and lower lobe atelectasis. 3. Additional chronic findings as described.  Electronically Signed   By: Jeb Levering M.D.   On: 09/30/2016 22:05 US Venous Img Lower Bilateral CLINICAL DATA:  66 y/o F; pain and swelling of the lower extremities.  EXAM: BILATERAL LOWER EXTREMITY VENOUS DOPPLER ULTRASOUND  TECHNIQUE: Gray-scale sonography with graded compression, as well as color  Doppler and duplex ultrasound were performed to evaluate the lower extremity deep venous systems from the level of the common femoral vein and including the common femoral, femoral, profunda femoral, popliteal and calf veins including the posterior tibial, peroneal and gastrocnemius veins when visible. The superficial great saphenous vein was also interrogated. Spectral Doppler was utilized to evaluate flow at rest and with distal augmentation maneuvers in the common femoral, femoral and popliteal veins.  COMPARISON:  None.  FINDINGS: RIGHT LOWER EXTREMITY  Common Femoral Vein: No evidence of thrombus. Normal compressibility, respiratory phasicity and response to augmentation.  Saphenofemoral Junction: No evidence of thrombus. Normal compressibility and flow on color Doppler imaging.  Profunda Femoral Vein: No evidence of thrombus.  Normal compressibility and flow on color Doppler imaging.  Femoral Vein: No evidence of thrombus. Normal compressibility, respiratory phasicity and response to augmentation.  Popliteal Vein: No evidence of thrombus. Normal compressibility, respiratory phasicity and response to augmentation.  Calf Veins: Limited visibility of the peroneal vein. Otherwise no evidence for thrombosis.  Superficial Great Saphenous Vein: No evidence of thrombus. Normal compressibility and flow on color Doppler imaging.  Venous Reflux:  None.  Other Findings:  None.  LEFT LOWER EXTREMITY  Common Femoral Vein: No evidence of thrombus. Normal compressibility, respiratory phasicity and response to augmentation.  Saphenofemoral Junction: No evidence of thrombus. Normal compressibility and flow on color Doppler imaging.  Profunda Femoral Vein: No evidence of thrombus. Normal compressibility and flow on color Doppler imaging.  Femoral Vein: No evidence of thrombus. Normal compressibility, respiratory phasicity and response to augmentation.  Popliteal Vein: No evidence of thrombus. Normal compressibility, respiratory phasicity and response to augmentation.  Calf Veins: Not visualized due to overlying bandages.  Superficial Great Saphenous Vein: No evidence of thrombus. Normal compressibility and flow on color Doppler imaging.  Venous Reflux:  None.  Other Findings:  None.  IMPRESSION: No evidence of DVT within either lower extremity. Limited visibility of calf veins.  Electronically Signed   By: Kristine Garbe M.D.   On: 09/30/2016 20:32  Complexity Note: Imaging results reviewed. Results shared with [name], using Layman's terms.                         Meds   Current Outpatient Medications:  .  albuterol (PROVENTIL HFA;VENTOLIN HFA) 108 (90 Base) MCG/ACT inhaler, Inhale 1-2 puffs into the lungs every 4 (four) hours as needed for wheezing or shortness of breath. (Patient  taking differently: Inhale 2 puffs into the lungs every 4 (four) hours as needed for wheezing or shortness of breath. ), Disp: 8 g, Rfl: 2 .  albuterol (PROVENTIL) (2.5 MG/3ML) 0.083% nebulizer solution, Take 3 mLs (2.5 mg total) by nebulization every 6 (six) hours as needed for wheezing or shortness of breath., Disp: 150 mL, Rfl: 1 .  ALPRAZolam (XANAX) 0.5 MG tablet, TAKE 1 TABLET BY MOUTH TWICE DAILY AS NEEDED, Disp: 60 tablet, Rfl: 0 .  buPROPion (WELLBUTRIN XL) 300 MG 24 hr tablet, TAKE 1 TABLET BY MOUTH DAILY, Disp: 90 tablet, Rfl: 0 .  calcium carbonate (OSCAL) 1500 (600 Ca) MG TABS tablet, Take 600 mg of elemental calcium by mouth 2 (two) times daily with a meal., Disp: , Rfl:  .  cyanocobalamin 500 MCG tablet, Take 500 mcg by mouth daily., Disp: , Rfl:  .  dabigatran (PRADAXA) 150 MG CAPS capsule, TAKE 1 CAPSULE BY MOUTH TWICE DAILY, Disp: 180 capsule, Rfl: 3 .  dexlansoprazole (DEXILANT) 60 MG capsule,  Take 1 capsule (60 mg total) by mouth daily. MUST KEEP NEXT APPT FOR 90 DAY SUPPLY, Disp: 30 capsule, Rfl: 0 .  [START ON 03/14/2017] DULoxetine (CYMBALTA) 60 MG capsule, Take 1 capsule (60 mg total) by mouth daily., Disp: 90 capsule, Rfl: 0 .  estradiol (ESTRACE) 0.1 MG/GM vaginal cream, Place 1 Applicatorful vaginally 3 (three) times a week., Disp: 42.5 g, Rfl: 12 .  fluconazole (DIFLUCAN) 150 MG tablet, Take by mouth., Disp: , Rfl:  .  [START ON 03/14/2017] gabapentin (NEURONTIN) 800 MG tablet, Take 1 tablet (800 mg total) by mouth 4 (four) times daily., Disp: 360 tablet, Rfl: 0 .  [START ON 05/13/2017] HYDROcodone-acetaminophen (NORCO/VICODIN) 5-325 MG tablet, Take 1 tablet by mouth every 8 (eight) hours as needed for severe pain., Disp: 90 tablet, Rfl: 0 .  levocetirizine (XYZAL) 5 MG tablet, Take 1 tablet (5 mg total) by mouth every evening., Disp: 90 tablet, Rfl: 3 .  levothyroxine (SYNTHROID, LEVOTHROID) 150 MCG tablet, TAKE ONE TABLET BY MOUTH EVERY MORNING, Disp: 90 tablet, Rfl: 3 .   meloxicam (MOBIC) 15 MG tablet, Take 15 mg by mouth daily. , Disp: , Rfl:  .  metFORMIN (GLUCOPHAGE) 1000 MG tablet, TAKE ONE TABLET BY MOUTH TWICE DAILY (Patient taking differently: TAKE ONE TABLET IN THE MORNING AND HALF TABLET AT NIGHT), Disp: 180 tablet, Rfl: 1 .  metoprolol tartrate (LOPRESSOR) 25 MG tablet, TAKE ONE TABLET TWICE DAILY, Disp: 180 tablet, Rfl: 3 .  mirabegron ER (MYRBETRIQ) 25 MG TB24 tablet, Take 1 tablet (25 mg total) by mouth daily., Disp: 30 tablet, Rfl: 12 .  montelukast (SINGULAIR) 10 MG tablet, TAKE ONE TABLET BY MOUTH EVERY DAY (Patient taking differently: TAKE ONE TABLET BY MOUTH EVERY evening), Disp: 90 tablet, Rfl: 3 .  Multiple Vitamin (MULTIVITAMIN WITH MINERALS) TABS tablet, Take 1 tablet by mouth daily., Disp: , Rfl:  .  ondansetron (ZOFRAN) 4 MG tablet, Take 1 tablet (4 mg total) every 8 (eight) hours as needed by mouth for nausea or vomiting., Disp: 20 tablet, Rfl: 0 .  oxybutynin (DITROPAN XL) 15 MG 24 hr tablet, Take 1 tablet (15 mg total) by mouth at bedtime., Disp: 90 tablet, Rfl: 3 .  quinapril (ACCUPRIL) 20 MG tablet, TAKE ONE TABLET EVERY DAY, Disp: 90 tablet, Rfl: 3 .  sulfamethoxazole-trimethoprim (BACTRIM,SEPTRA) 400-80 MG tablet, Take 1 tablet by mouth 2 (two) times daily., Disp: , Rfl:  .  triamcinolone cream (KENALOG) 0.1 %, Apply 1 application 2 (two) times daily as needed topically., Disp: 45 g, Rfl: 1 .  [START ON 04/13/2017] HYDROcodone-acetaminophen (NORCO/VICODIN) 5-325 MG tablet, Take 1 tablet by mouth every 8 (eight) hours as needed for severe pain., Disp: 90 tablet, Rfl: 0 .  [START ON 03/14/2017] HYDROcodone-acetaminophen (NORCO/VICODIN) 5-325 MG tablet, Take 1 tablet by mouth every 8 (eight) hours as needed for severe pain., Disp: 90 tablet, Rfl: 0  ROS  Constitutional: Denies any fever or chills Gastrointestinal: No reported hemesis, hematochezia, vomiting, or acute GI distress Musculoskeletal: Denies any acute onset joint swelling,  redness, loss of ROM, or weakness Neurological: No reported episodes of acute onset apraxia, aphasia, dysarthria, agnosia, amnesia, paralysis, loss of coordination, or loss of consciousness  Allergies  Ms. Schwake is allergic to penicillins; aspirin; morphine and related; and terramycin [oxytetracycline].  [place]  Drug: Ms. Neault  reports that she does not use drugs. Alcohol:  reports that she does not drink alcohol. Tobacco:  reports that  has never smoked. she has never used smokeless tobacco. Medical:  has a past medical history of Abnormal antibody titer, Anxiety and depression, Arthritis, Asthma, Cystocele, Depression, Diabetes ([place]), DVT (deep venous thrombosis) ([place]), Dysrhythmia, Edema, GERD (gastroesophageal reflux disease), Heart murmur, History of IBS, History of methicillin resistant staphylococcus aureus (MRSA) (2008), Hyperlipidemia, Hypertension, Hypothyroidism, Insomnia, Kidney stone, Neuropathy involving both lower extremities, Obesity, Class II, BMI 35-39.9, with comorbidity, Paroxysmal atrial fibrillation ([place]) (2007), Peripheral vascular disease ([place]), Sleep apnea, Urinary incontinence, and Venous stasis dermatitis of both lower extremities. Surgical: Ms. Ashworth  has a past surgical history that includes Ankle surgery (Right); Vaginal hysterectomy; Sleeve Gastroplasty; Lithotripsy; Kidney surgery (Right); transthoracic echocardiogram (08/03/2013); transesophageal echocardiogram (08/08/2013); Tonsillectomy; Total knee arthroplasty; Hiatal hernia repair; Joint replacement; Cholecystectomy; I&D extremity (Left, 06/27/2015); Application if wound vac (Left, 06/27/2015); removal of left hematoma (Left); NM GATED MYOVIEW [facility] HX) (February 2017); Reverse shoulder arthroplasty (Right, 10/08/2015); Hernia repair; Ulnar nerve transposition (Right, 08/06/2016); arm surgery; Irrigation and debridement abscess (Left, 09/07/2016); Incision and drainage of wound (Left, 09/25/2016); Application if wound vac  (Left, 09/25/2016); Colonoscopy; Upper gi endoscopy; Esophagogastroduodenoscopy (egd) with propofol (N/A, 01/11/2017); and Colonoscopy with propofol (N/A, 01/11/2017). Family: family history includes Alcohol abuse in her father; Arthritis in her maternal grandfather, maternal grandmother, mother, paternal grandfather, and paternal grandmother; Breast cancer in her maternal aunt; Cancer in her father and maternal aunt; Cerebral aneurysm in her father; Diabetes in her father, maternal grandmother, and paternal grandmother; Heart disease in her maternal grandfather; Hypertension in her father, maternal grandfather, maternal grandmother, paternal grandfather, and paternal grandmother; Kidney disease in her mother; Mental illness in her sister; Peripheral vascular disease in her father; Skin cancer in her father; Varicose Veins in her mother.  Constitutional Exam  General appearance: Well nourished, well developed, and well hydrated. In no apparent acute distress Vitals:   02/18/17 1203  BP: (!) 127/57  Pulse: (!) 52  Resp: 18  Temp: 98.7 F (37.1 C)  SpO2: 98%  Weight: 258 lb (117 kg)  Height: 5' 5.5" (1.664 m)   Psych/Mental status: Alert, oriented x 3 (person, place, & time)       Eyes: PERLA Respiratory: No evidence of acute respiratory distress   Thoracic Spine Area Exam  Skin & Axial Inspection: No masses, redness, or swelling Alignment: Symmetrical Functional ROM: Unrestricted ROM Stability: No instability detected Muscle Tone/Strength: Functionally intact. No obvious neuro-muscular anomalies detected. Sensory (Neurological): Unimpaired Muscle strength & Tone: No palpable anomalies  Gait & Posture Assessment  Ambulation: Patient came in today in a wheel chair Gait: Relatively normal for age and body habitus Posture: WNL   Lower Extremity Exam    Side: Right lower extremity  Side: Left lower extremity  Skin & Extremity Inspection: Skin color, temperature, and hair growth are WNL.  No peripheral edema or cyanosis. No masses, redness, swelling, asymmetry, or associated skin lesions. No contractures.  Skin & Extremity Inspection: foot   Functional ROM: Unrestricted ROM          Functional ROM: Unrestricted ROM          Muscle Tone/Strength: Functionally intact. No obvious neuro-muscular anomalies detected.  Muscle Tone/Strength: Functionally intact. No obvious neuro-muscular anomalies detected.  Sensory (Neurological): Unimpaired  Sensory (Neurological): Unimpaired  Palpation: No palpable anomalies  Palpation: No palpable anomalies   Assessment  Primary Diagnosis & Pertinent Problem List: The primary encounter diagnosis was Diabetic peripheral neuropathy associated with type 2 diabetes mellitus ([place]). Diagnoses of Peripheral vascular disease due to secondary diabetes mellitus ([place]), Spinal stenosis of thoracic region, Chronic pain syndrome, Depression  with anxiety, and Long term current use of opiate analgesic were also pertinent to this visit.  Status Diagnosis  Controlled Worsening Controlled 1. Diabetic peripheral neuropathy associated with type 2 diabetes mellitus ([place])   2. Peripheral vascular disease due to secondary diabetes mellitus ([place])   3. Spinal stenosis of thoracic region   4. Chronic pain syndrome   5. Depression with anxiety   6. Long term current use of opiate analgesic     Problems updated and reviewed during this visit: No problems updated. Plan of Care  Pharmacotherapy (Medications Ordered): Meds ordered this encounter  Medications  . HYDROcodone-acetaminophen (NORCO/VICODIN) 5-325 MG tablet    Sig: Take 1 tablet by mouth every 8 (eight) hours as needed for severe pain.    Dispense:  90 tablet    Refill:  0    Do not add this medication to the electronic "Automatic Refill" notification system. Patient may have prescription filled one day early if pharmacy is closed on scheduled refill date. Do not fill until: 05/13/2017 To last until: 06/12/2017     Order Specific Question:   Supervising Provider    Answer:   Milinda Pointer [phone]  . gabapentin (NEURONTIN) 800 MG tablet    Sig: Take 1 tablet (800 mg total) by mouth 4 (four) times daily.    Dispense:  360 tablet    Refill:  0    Do not place this medication, or any other prescription from our practice, on "Automatic Refill". Patient may have prescription filled one day early if pharmacy is closed on scheduled refill date.    Order Specific Question:   Supervising Provider    Answer:   Milinda Pointer [phone]  . DULoxetine (CYMBALTA) 60 MG capsule    Sig: Take 1 capsule (60 mg total) by mouth daily.    Dispense:  90 capsule    Refill:  0    Do not place this medication, or any other prescription from our practice, on "Automatic Refill". Patient may have prescription filled one day early if pharmacy is closed on scheduled refill date.    Order Specific Question:   Supervising Provider    Answer:   Milinda Pointer [phone]  . HYDROcodone-acetaminophen (NORCO/VICODIN) 5-325 MG tablet    Sig: Take 1 tablet by mouth every 8 (eight) hours as needed for severe pain.    Dispense:  90 tablet    Refill:  0    Do not add this medication to the electronic "Automatic Refill" notification system. Patient may have prescription filled one day early if pharmacy is closed on scheduled refill date. Do not fill until:04/13/2017 To last until: 05/13/2017    Order Specific Question:   Supervising Provider    Answer:   Milinda Pointer [phone]  . HYDROcodone-acetaminophen (NORCO/VICODIN) 5-325 MG tablet    Sig: Take 1 tablet by mouth every 8 (eight) hours as needed for severe pain.    Dispense:  90 tablet    Refill:  0    Do not add this medication to the electronic "Automatic Refill" notification system. Patient may have prescription filled one day early if pharmacy is closed on scheduled refill date. Do not fill until: 03/14/2017 To last until: 04/13/2017    Order Specific Question:    Supervising Provider    Answer:   Milinda Pointer [phone]  This SmartLink is deprecated. Use AVSMEDLIST instead to display the medication list for a patient. Medications administered today: [name] had no medications administered during this visit. Lab-work, procedure(s),  and/or referral(s): No orders of the defined types were placed in this encounter.  Imaging and/or referral(s): None  Interventional therapies: Planned, scheduled, and/or pending:  Continue with current regimen, Discussed with Dr Lowella Dandy, no change for the treatment of the chronic pain . She was given written instruction for her acute; procedural pain treatment.  (Always stop Pradaxa x 5 days prior to any blocks.)   Considering:  Diagnostic Right suprascapular nerve block Diagnostic right intra-articular knee injection Possible series of 5 Hyalgan knee injections Diagnostic right-sided genicular nerve block Possible right-sided genicular nerve radiofrequencyablation  Palliative right intra-articular shoulder joint injection Diagnostic right-sided suprascapular nerve block Possible right-sided suprascapular nerve radiofrequencyablation  Palliative right lateral femoral cutaneous nerve block Palliative repeat right sided lateral femoral continuous nerve radiofrequencyablation   Palliative PRN treatment(s):  Diagnostic right intra-articular knee injection Possible series of 5 Hyalgan knee injections Diagnostic right-sided genicular nerve block Palliative right intra-articular shoulder joint injection Diagnostic right-sided suprascapular nerve block Palliative right lateral femoral cutaneous nerve block Palliative repeat right sided lateral femoral continuous nerve radiofrequencyablation    Provider-requested follow-up: Return in about 3 months (around 05/20/2017) for MedMgmt.  Future Appointments  Date Time Provider Theodore  02/25/2017 10:30 AM Leone Haven, MD [id number]  05/18/2017 10:30 AM Vevelyn Francois, NP [facility] None   Primary Care Physician: Leone Haven, MD Location: [facility] Note by: Vevelyn Francois NP Date: 02/18/2017; Time: 3:13 PM  Pain Score Disclaimer: We use the NRS-11 scale. This is a self-reported, subjective measurement of pain severity with only modest accuracy. It is used primarily to identify changes within a particular patient. It must be understood that outpatient pain scales are significantly less accurate that those used for research, where they can be applied under ideal controlled circumstances with minimal exposure to variables. In reality, the score is likely to be a combination of pain intensity and pain affect, where pain affect describes the degree of emotional arousal or changes in action readiness caused by the sensory experience of pain. Factors such as social and work situation, setting, emotional state, anxiety levels, expectation, and prior pain experience may influence pain perception and show large inter-individual differences that may also be affected by time variables.  Patient instructions provided during this appointment: Patient Instructions    ____________________________________________________________________________________________  Medication Rules  Applies to: All patients receiving prescriptions (written or electronic).  Pharmacy of record: Pharmacy where electronic prescriptions will be sent. If written prescriptions are taken to a different pharmacy, please inform the nursing staff. The pharmacy listed in the electronic medical record should be the one where you would like electronic prescriptions to be sent.  Prescription refills: Only during scheduled appointments. Applies to both, written and electronic prescriptions.  NOTE: The following applies primarily to controlled substances (Opioid* Pain Medications).   Patient's responsibilities: 1. Pain Pills:  Bring all pain pills to every appointment (except for procedure appointments). 2. Pill Bottles: Bring pills in original pharmacy bottle. Always bring newest bottle. Bring bottle, even if empty. 3. Medication refills: You are responsible for knowing and keeping track of what medications you need refilled. The day before your appointment, write a list of all prescriptions that need to be refilled. Bring that list to your appointment and give it to the admitting nurse. Prescriptions will be written only during appointments. If you forget a medication, it will not be "Called in", "Faxed", or "electronically sent". You will need to get another appointment to get these prescribed.  4. Prescription Accuracy: You are responsible for carefully inspecting your prescriptions before leaving our office. Have the discharge nurse carefully go over each prescription with you, before taking them home. Make sure that your name is accurately spelled, that your address is correct. Check the name and dose of your medication to make sure it is accurate. Check the number of pills, and the written instructions to make sure they are clear and accurate. Make sure that you are given enough medication to last until your next medication refill appointment. 5. Taking Medication: Take medication as prescribed. Never take more pills than instructed. Never take medication more frequently than prescribed. Taking less pills or less frequently is permitted and encouraged, when it comes to controlled substances (written prescriptions).  6. Inform other Doctors: Always inform, all of your healthcare providers, of all the medications you take. 7. Pain Medication from other Providers: You are not allowed to accept any additional pain medication from any other Doctor or Healthcare provider. There are two exceptions to this rule. (see below) In the event that you require additional pain medication, you are responsible for notifying us, as stated  below. 8. Medication Agreement: You are responsible for carefully reading and following our Medication Agreement. This must be signed before receiving any prescriptions from our practice. Safely store a copy of your signed Agreement. Violations to the Agreement will result in no further prescriptions. (Additional copies of our Medication Agreement are available upon request.) 9. Laws, Rules, & Regulations: All patients are expected to follow all Federal and [employer], Rules, [employer]. Ignorance of the Laws does not constitute a valid excuse. The use of any illegal substances is prohibited. 10. Adopted CDC guidelines & recommendations: Target dosing levels will be at or below 60 MME/day. Use of benzodiazepines** is not recommended.  Exceptions: There are only two exceptions to the rule of not receiving pain medications from other Healthcare Providers. 1. Exception #1 (Emergencies): In the event of an emergency (i.e.: accident requiring emergency care), you are allowed to receive additional pain medication. However, you are responsible for: As soon as you are able, call our office [phone], at any time of the day or night, and leave a message stating your name, the date and nature of the emergency, and the name and dose of the medication prescribed. In the event that your call is answered by a member of our staff, make sure to document and save the date, time, and the name of the person that took your information.  2. Exception #2 (Planned Surgery): In the event that you are scheduled by another doctor or dentist to have any type of surgery or procedure, you are allowed (for a period no longer than 30 days), to receive additional pain medication, for the acute post-op pain. However, in this case, you are responsible for picking up a copy of our "Post-op Pain Management for Surgeons" handout, and giving it to your surgeon or dentist. This document is available at our office, and does not  require an appointment to obtain it. Simply go to our office during business hours (Monday-Thursday from 8:00 AM to 4:00 PM) (Friday 8:00 AM to 12:00 Noon) or if you have a scheduled appointment with [place], prior to your surgery, and ask for it by name. In addition, you will need to provide [place] with your name, name of your surgeon, type of surgery, and date of procedure or surgery.  *Opioid medications include: morphine, codeine, oxycodone, oxymorphone, hydrocodone, hydromorphone, meperidine, tramadol,  tapentadol, buprenorphine, fentanyl, methadone. **Benzodiazepine medications include: diazepam (Valium), alprazolam (Xanax), clonazepam (Klonopine), lorazepam (Ativan), clorazepate (Tranxene), chlordiazepoxide (Librium), estazolam (Prosom), oxazepam (Serax), temazepam (Restoril), triazolam (Halcion)  ____________________________________________________________________________________________   BMI Assessment: Estimated body mass index is 42.28 kg/m as calculated from the following:   Height as of this encounter: 5' 5.5" (1.664 m).   Weight as of this encounter: 258 lb (117 kg).  BMI interpretation table: BMI level Category Range association with higher incidence of chronic pain  <18 kg/m2 Underweight   18.5-24.9 kg/m2 Ideal body weight   25-29.9 kg/m2 Overweight Increased incidence by 20%  30-34.9 kg/m2 Obese (Class I) Increased incidence by 68%  35-39.9 kg/m2 Severe obesity (Class II) Increased incidence by 136%  >40 kg/m2 Extreme obesity (Class III) Increased incidence by 254%   BMI Readings from Last 4 Encounters:  02/18/17 42.28 kg/m  02/02/17 42.32 kg/m  01/11/17 43.26 kg/m  12/03/16 43.36 kg/m   Wt Readings from Last 4 Encounters:  02/18/17 258 lb (117 kg)  02/02/17 258 lb 4 oz (117.1 kg)  01/11/17 264 lb (119.7 kg)  12/03/16 264 lb 9.6 oz (120 kg)   Prescriptions for Cymbalta and Gabapentin were sent to your pharmacy. You were given 3 prescriptions for Hydrocodone  today.

## 2017-02-19 ENCOUNTER — Encounter: Payer: Self-pay | Admitting: *Deleted

## 2017-02-22 ENCOUNTER — Encounter: Payer: Self-pay | Admitting: Anesthesiology

## 2017-02-22 ENCOUNTER — Ambulatory Visit: Payer: PPO | Admitting: Anesthesiology

## 2017-02-22 ENCOUNTER — Encounter
Admission: RE | Disposition: A | Discharge status: Home / Self Care | Payer: Self-pay | Source: Ambulatory Visit | Attending: Gastroenterology

## 2017-02-22 ENCOUNTER — Ambulatory Visit
Admission: RE | Admit: 2017-02-22 | Discharge: 2017-02-22 | Disposition: A | Discharge status: Home / Self Care | Payer: PPO | Source: Ambulatory Visit | Attending: Gastroenterology | Admitting: Gastroenterology

## 2017-02-22 DIAGNOSIS — Z87442 Personal history of urinary calculi: Secondary | ICD-10-CM | POA: Diagnosis not present

## 2017-02-22 DIAGNOSIS — I872 Venous insufficiency (chronic) (peripheral): Secondary | ICD-10-CM | POA: Diagnosis not present

## 2017-02-22 DIAGNOSIS — E1151 Type 2 diabetes mellitus with diabetic peripheral angiopathy without gangrene: Secondary | ICD-10-CM | POA: Diagnosis not present

## 2017-02-22 DIAGNOSIS — Z539 Procedure and treatment not carried out, unspecified reason: Secondary | ICD-10-CM | POA: Diagnosis not present

## 2017-02-22 DIAGNOSIS — Z7901 Long term (current) use of anticoagulants: Secondary | ICD-10-CM | POA: Insufficient documentation

## 2017-02-22 DIAGNOSIS — G4700 Insomnia, unspecified: Secondary | ICD-10-CM | POA: Insufficient documentation

## 2017-02-22 DIAGNOSIS — Z6841 Body Mass Index (BMI) 40.0 and over, adult: Secondary | ICD-10-CM | POA: Diagnosis not present

## 2017-02-22 DIAGNOSIS — Z791 Long term (current) use of non-steroidal anti-inflammatories (NSAID): Secondary | ICD-10-CM | POA: Insufficient documentation

## 2017-02-22 DIAGNOSIS — J45909 Unspecified asthma, uncomplicated: Secondary | ICD-10-CM | POA: Insufficient documentation

## 2017-02-22 DIAGNOSIS — F329 Major depressive disorder, single episode, unspecified: Secondary | ICD-10-CM | POA: Diagnosis not present

## 2017-02-22 DIAGNOSIS — F419 Anxiety disorder, unspecified: Secondary | ICD-10-CM | POA: Insufficient documentation

## 2017-02-22 DIAGNOSIS — I10 Essential (primary) hypertension: Secondary | ICD-10-CM | POA: Diagnosis not present

## 2017-02-22 DIAGNOSIS — E1140 Type 2 diabetes mellitus with diabetic neuropathy, unspecified: Secondary | ICD-10-CM | POA: Insufficient documentation

## 2017-02-22 DIAGNOSIS — E039 Hypothyroidism, unspecified: Secondary | ICD-10-CM | POA: Insufficient documentation

## 2017-02-22 DIAGNOSIS — Z886 Allergy status to analgesic agent status: Secondary | ICD-10-CM | POA: Insufficient documentation

## 2017-02-22 DIAGNOSIS — I480 Paroxysmal atrial fibrillation: Secondary | ICD-10-CM | POA: Diagnosis not present

## 2017-02-22 DIAGNOSIS — Z881 Allergy status to other antibiotic agents status: Secondary | ICD-10-CM | POA: Insufficient documentation

## 2017-02-22 DIAGNOSIS — Z538 Procedure and treatment not carried out for other reasons: Secondary | ICD-10-CM | POA: Diagnosis not present

## 2017-02-22 DIAGNOSIS — Z880 Allergy status to penicillin: Secondary | ICD-10-CM | POA: Insufficient documentation

## 2017-02-22 DIAGNOSIS — Z79890 Hormone replacement therapy (postmenopausal): Secondary | ICD-10-CM | POA: Insufficient documentation

## 2017-02-22 DIAGNOSIS — Z79899 Other long term (current) drug therapy: Secondary | ICD-10-CM | POA: Insufficient documentation

## 2017-02-22 DIAGNOSIS — Z7984 Long term (current) use of oral hypoglycemic drugs: Secondary | ICD-10-CM | POA: Insufficient documentation

## 2017-02-22 DIAGNOSIS — G4730 Sleep apnea, unspecified: Secondary | ICD-10-CM | POA: Diagnosis not present

## 2017-02-22 DIAGNOSIS — D509 Iron deficiency anemia, unspecified: Secondary | ICD-10-CM | POA: Diagnosis not present

## 2017-02-22 DIAGNOSIS — Z885 Allergy status to narcotic agent status: Secondary | ICD-10-CM | POA: Insufficient documentation

## 2017-02-22 DIAGNOSIS — Z86718 Personal history of other venous thrombosis and embolism: Secondary | ICD-10-CM | POA: Diagnosis not present

## 2017-02-22 DIAGNOSIS — K589 Irritable bowel syndrome without diarrhea: Secondary | ICD-10-CM | POA: Insufficient documentation

## 2017-02-22 HISTORY — PX: COLONOSCOPY WITH PROPOFOL: SHX5780

## 2017-02-22 LAB — GLUCOSE, CAPILLARY: GLUCOSE-CAPILLARY: 111 mg/dL — AB (ref 65–99)

## 2017-02-22 SURGERY — COLONOSCOPY WITH PROPOFOL
Anesthesia: General

## 2017-02-22 MED ORDER — SODIUM CHLORIDE 0.9 % IV SOLN: INTRAVENOUS | Status: DC

## 2017-02-22 MED ORDER — LIDOCAINE HCL (CARDIAC) 20 MG/ML IV SOLN
INTRAVENOUS | Status: DC | PRN
  Administered 2017-02-22: 100 mg via INTRAVENOUS

## 2017-02-22 MED ORDER — SODIUM CHLORIDE 0.9 % IV SOLN
INTRAVENOUS | Status: DC
  Administered 2017-02-22 (×2): via INTRAVENOUS

## 2017-02-22 MED ORDER — LIDOCAINE HCL (PF) 2 % IJ SOLN
INTRAMUSCULAR | Status: AC
Start: 2017-02-22 — End: 2017-02-22
  Filled 2017-02-22: qty 10

## 2017-02-22 MED ORDER — CLINDAMYCIN PHOSPHATE 600 MG/50ML IV SOLN
INTRAVENOUS | Status: AC
  Administered 2017-02-22: 600 mg
  Filled 2017-02-22: qty 50

## 2017-02-22 MED ORDER — PROPOFOL 500 MG/50ML IV EMUL
INTRAVENOUS | Status: AC
  Filled 2017-02-22: qty 50

## 2017-02-22 MED ORDER — PROPOFOL 500 MG/50ML IV EMUL
INTRAVENOUS | Status: DC | PRN
  Administered 2017-02-22: 120 ug/kg/min via INTRAVENOUS

## 2017-02-22 MED ORDER — PROPOFOL 10 MG/ML IV BOLUS
INTRAVENOUS | Status: DC | PRN
  Administered 2017-02-22: 40 mg via INTRAVENOUS

## 2017-02-22 MED ORDER — CLINDAMYCIN PHOSPHATE 300 MG/2ML IJ SOLN
600.0000 mg | Freq: Once | INTRAMUSCULAR | Status: DC
  Filled 2017-02-22: qty 4

## 2017-02-22 NOTE — Anesthesia Preprocedure Evaluation (Addendum)
Anesthesia Evaluation  Patient identified by MRN, date of birth, ID band Patient awake    Reviewed: Allergy & Precautions, NPO status , Patient's Chart, lab work & pertinent test results, reviewed documented beta blocker date and time   Airway Mallampati: III  TM Distance: >3 FB     Dental  (+) Chipped   Pulmonary asthma , sleep apnea ,           Cardiovascular hypertension, Pt. on medications and Pt. on home beta blockers + Peripheral Vascular Disease  + dysrhythmias Atrial Fibrillation + Valvular Problems/Murmurs      Neuro/Psych PSYCHIATRIC DISORDERS Anxiety Depression  Neuromuscular disease    GI/Hepatic GERD  ,  Endo/Other  diabetes, Type 2Hypothyroidism Morbid obesity  Renal/GU Renal disease     Musculoskeletal  (+) Arthritis ,   Abdominal   Peds  Hematology  (+) anemia ,   Anesthesia Other Findings Frequent PVCS. Ef 64. Gastric sleeve.  Reproductive/Obstetrics                            Anesthesia Physical Anesthesia Plan  ASA: II  Anesthesia Plan: General   Post-op Pain Management:    Induction: Intravenous  PONV Risk Score and Plan:   Airway Management Planned: Oral ETT  Additional Equipment:   Intra-op Plan:   Post-operative Plan:   Informed Consent: I have reviewed the patients History and Physical, chart, labs and discussed the procedure including the risks, benefits and alternatives for the proposed anesthesia with the patient or authorized representative who has indicated his/her understanding and acceptance.     Plan Discussed with: CRNA  Anesthesia Plan Comments:         Anesthesia Quick Evaluation

## 2017-02-22 NOTE — Anesthesia Post-op Follow-up Note (Signed)
Anesthesia QCDR form completed.        

## 2017-02-22 NOTE — Op Note (Signed)
[facility] Patient Name: [name] Procedure Date: 02/22/2017 3:47 PM MRN: [id number] Account #: [id number] Date of Birth: 1951-01-29 Admit Type: Outpatient Age: 66 Room: [facility] ENDO ROOM 3 Gender: Female Note Status: Finalized Procedure:            Colonoscopy Indications:          Iron deficiency anemia Providers:            [name], MD Referring MD:         Angela Adam. Caryl Bis (Referring MD) Medicines:            Monitored Anesthesia Care Complications:        No immediate complications. Procedure:            Pre-Anesthesia Assessment:                       - ASA Grade Assessment: III - A patient with severe                        systemic disease.                       After obtaining informed consent, the colonoscope was                        passed under direct vision. Throughout the procedure,                        the patient's blood pressure, pulse, and oxygen                        saturations were monitored continuously. The Olympus                        PCF-H180AL colonoscope ( S#: [id number] ) was introduced                        through the anus with the intention of advancing to the                        cecum. The scope was advanced to the sigmoid colon                        before the procedure was aborted. Medications were                        given. The colonoscopy was unusually difficult due to                        poor bowel prep. The patient tolerated the procedure                        well. The quality of the bowel preparation was                        inadequate. Findings:      The scope was carefully advanced to about 50 cm, proximal sigmoid       region. At that point, the prep became quite poor. A large amount of       semi-liquid stool was found in  the rectum, in the sigmoid colon and in       the descending colon, precluding visualization. This could not be rinsed       adequately for proper  evaluation. Impression:           - Preparation of the colon was inadequate.                       - Stool in the rectum, in the sigmoid colon and in the                        descending colon.                       - No specimens collected. Recommendation:       - Discharge patient to home.                       - reprep and reschedule [name], MD 02/22/2017 4:26:49 PM This report has been signed electronically. Number of Addenda: 0 Note Initiated On: 02/22/2017 3:47 PM Total Procedure Duration: 0 hours 13 minutes 36 seconds       [facility]

## 2017-02-22 NOTE — Anesthesia Postprocedure Evaluation (Signed)
Anesthesia Post Note  Patient: [name]  Procedure(s) Performed: COLONOSCOPY WITH PROPOFOL (N/A )  Patient location during evaluation: Endoscopy Anesthesia Type: General Level of consciousness: awake and alert and oriented Pain management: pain level controlled Vital Signs Assessment: post-procedure vital signs reviewed and stable Respiratory status: spontaneous breathing, nonlabored ventilation and respiratory function stable Cardiovascular status: blood pressure returned to baseline and stable Postop Assessment: no signs of nausea or vomiting Anesthetic complications: no     Last Vitals:  Vitals:   02/22/17 1645 02/22/17 1655  BP:  (!) 153/82  Pulse:    Resp:    Temp:    SpO2: 94% 95%    Last Pain:  Vitals:   02/22/17 1625  TempSrc: Tympanic                 Mykah Shin

## 2017-02-22 NOTE — OR Nursing (Signed)
Procedure not completed due to poor prep. Got 50 cm in colon.

## 2017-02-22 NOTE — Transfer of Care (Signed)
Immediate Anesthesia Transfer of Care Note  Patient: [name]  Procedure(s) Performed: COLONOSCOPY WITH PROPOFOL (N/A )  Patient Location: Endoscopy Unit  Anesthesia Type:General  Level of Consciousness: awake, oriented, drowsy and patient cooperative  Airway & Oxygen Therapy: Patient Spontanous Breathing and Patient connected to nasal cannula oxygen  Post-op Assessment: Report given to RN and Post -op Vital signs reviewed and stable  Post vital signs: Reviewed and stable  Last Vitals:  Vitals:   02/22/17 1543  BP: (!) 164/84  Pulse: 63  Resp: 16  Temp: (!) 35.6 C  SpO2: 97%    Last Pain:  Vitals:   02/22/17 1543  TempSrc: Tympanic         Complications: No apparent anesthesia complications

## 2017-02-22 NOTE — H&P (Signed)
Outpatient short stay form Pre-procedure 02/22/2017 3:57 PM [name] MD  Primary Physician: Dr. Roanna Epley   Reason for visit:  Colonoscopy  History of present illness:  Patient is a 66 year old female presenting today as above. She tolerated her prep well. Pradaxa which has been held for 2 days. She has normal renal function. She takes no other blood thinning agents or aspirin products. She has been found to be iron deficient. She does take DEXA labs as well as meloxicam.    Current Facility-Administered Medications:  .  0.9 %  sodium chloride infusion, , Intravenous, Continuous, [name], MD, Last Rate: 20 mL/hr at 02/22/17 1540 .  0.9 %  sodium chloride infusion, , Intravenous, Continuous, [name], MD .  0.9 %  sodium chloride infusion, , Intravenous, Continuous, [name], MD .  clindamycin (CLEOCIN) injection 600 mg, 600 mg, Intravenous, Once, [name], MD  Medications Prior to Admission  Medication Sig Dispense Refill Last Dose  . albuterol (PROVENTIL HFA;VENTOLIN HFA) 108 (90 Base) MCG/ACT inhaler Inhale 1-2 puffs into the lungs every 4 (four) hours as needed for wheezing or shortness of breath. (Patient taking differently: Inhale 2 puffs into the lungs every 4 (four) hours as needed for wheezing or shortness of breath. ) 8 g 2 02/21/2017 at Unknown time  . ALPRAZolam (XANAX) 0.5 MG tablet TAKE 1 TABLET BY MOUTH TWICE DAILY AS NEEDED 60 tablet 0 Past Week at Unknown time  . buPROPion (WELLBUTRIN XL) 300 MG 24 hr tablet TAKE 1 TABLET BY MOUTH DAILY 90 tablet 0 02/21/2017 at Unknown time  . calcium carbonate (OSCAL) 1500 (600 Ca) MG TABS tablet Take 600 mg of elemental calcium by mouth 2 (two) times daily with a meal.   Past Week at Unknown time  . dabigatran (PRADAXA) 150 MG CAPS capsule TAKE 1 CAPSULE BY MOUTH TWICE DAILY 180 capsule 3 Past Week at Unknown time  . dexlansoprazole (DEXILANT) 60 MG capsule Take 1 capsule (60 mg total) by  mouth daily. MUST KEEP NEXT APPT FOR 90 DAY SUPPLY 30 capsule 0 02/22/2017 at Unknown time  . [START ON 03/14/2017] DULoxetine (CYMBALTA) 60 MG capsule Take 1 capsule (60 mg total) by mouth daily. 90 capsule 0 02/21/2017 at Unknown time  . [START ON 03/14/2017] gabapentin (NEURONTIN) 800 MG tablet Take 1 tablet (800 mg total) by mouth 4 (four) times daily. 360 tablet 0 02/21/2017 at Unknown time  . [START ON 05/13/2017] HYDROcodone-acetaminophen (NORCO/VICODIN) 5-325 MG tablet Take 1 tablet by mouth every 8 (eight) hours as needed for severe pain. 90 tablet 0 Past Week at Unknown time  . levocetirizine (XYZAL) 5 MG tablet Take 1 tablet (5 mg total) by mouth every evening. 90 tablet 3 Past Month at Unknown time  . levothyroxine (SYNTHROID, LEVOTHROID) 150 MCG tablet TAKE ONE TABLET BY MOUTH EVERY MORNING 90 tablet 3 02/21/2017 at Unknown time  . meloxicam (MOBIC) 15 MG tablet Take 15 mg by mouth daily.    02/21/2017 at Unknown time  . metoprolol tartrate (LOPRESSOR) 25 MG tablet TAKE ONE TABLET TWICE DAILY 180 tablet 3 02/21/2017 at Unknown time  . mirabegron ER (MYRBETRIQ) 25 MG TB24 tablet Take 1 tablet (25 mg total) by mouth daily. 30 tablet 12 02/22/2017 at Unknown time  . oxybutynin (DITROPAN XL) 15 MG 24 hr tablet Take 1 tablet (15 mg total) by mouth at bedtime. 90 tablet 3 02/21/2017 at Unknown time  . quinapril (ACCUPRIL) 20 MG tablet TAKE ONE TABLET  EVERY DAY 90 tablet 3 02/22/2017 at Unknown time  . sulfamethoxazole-trimethoprim (BACTRIM,SEPTRA) 400-80 MG tablet Take 1 tablet by mouth 2 (two) times daily.   Past Week at Unknown time  . albuterol (PROVENTIL) (2.5 MG/3ML) 0.083% nebulizer solution Take 3 mLs (2.5 mg total) by nebulization every 6 (six) hours as needed for wheezing or shortness of breath. 150 mL 1 Taking  . cyanocobalamin 500 MCG tablet Take 500 mcg by mouth daily.   Taking  . estradiol (ESTRACE) 0.1 MG/GM vaginal cream Place 1 Applicatorful vaginally 3 (three) times a week. 42.5 g 12  Taking  . [START ON 04/13/2017] HYDROcodone-acetaminophen (NORCO/VICODIN) 5-325 MG tablet Take 1 tablet by mouth every 8 (eight) hours as needed for severe pain. 90 tablet 0   . [START ON 03/14/2017] HYDROcodone-acetaminophen (NORCO/VICODIN) 5-325 MG tablet Take 1 tablet by mouth every 8 (eight) hours as needed for severe pain. 90 tablet 0   . metFORMIN (GLUCOPHAGE) 1000 MG tablet TAKE ONE TABLET BY MOUTH TWICE DAILY (Patient taking differently: TAKE ONE TABLET IN THE MORNING AND HALF TABLET AT NIGHT) 180 tablet 1 Taking  . montelukast (SINGULAIR) 10 MG tablet TAKE ONE TABLET BY MOUTH EVERY DAY (Patient taking differently: TAKE ONE TABLET BY MOUTH EVERY evening) 90 tablet 3 Taking  . Multiple Vitamin (MULTIVITAMIN WITH MINERALS) TABS tablet Take 1 tablet by mouth daily.   Taking  . ondansetron (ZOFRAN) 4 MG tablet Take 1 tablet (4 mg total) every 8 (eight) hours as needed by mouth for nausea or vomiting. 20 tablet 0 Taking  . triamcinolone cream (KENALOG) 0.1 % Apply 1 application 2 (two) times daily as needed topically. 45 g 1 Taking     Allergies  Allergen Reactions  . Penicillins Hives, Shortness Of Breath and Swelling    Facial swelling Has patient had a PCN reaction causing immediate rash, facial/tongue/throat swelling, SOB or lightheadedness with hypotension: Yes Has patient had a PCN reaction causing severe rash involving mucus membranes or skin necrosis: Yes Has patient had a PCN reaction that required hospitalization No Has patient had a PCN reaction occurring within the last 10 years: No If all of the above answers are "NO", then may proceed with Cephalosporin use.   . Aspirin Hives  . Morphine And Related Other (See Comments)    Patient becomes very confused  . Terramycin [Oxytetracycline] Hives     Past Medical History:  Diagnosis Date  . Abnormal antibody titer   . Anxiety and depression   . Arthritis   . Asthma   . Cystocele   . Depression   . Diabetes ([place])   . DVT  (deep venous thrombosis) ([facility])    Righ calf  . Dysrhythmia   . Edema   . GERD (gastroesophageal reflux disease)   . Heart murmur    Echocardiogram June 2015: Mild MR (possible vegetation seen on TTE, not seen on TEE), normal LV size with moderate concentric LVH. Normal function EF 60-65%. Normal diastolic function. Mild LA dilation.  Marland Kitchen History of IBS   . History of methicillin resistant staphylococcus aureus (MRSA) 2008  . Hyperlipidemia   . Hypertension   . Hypothyroidism   . Insomnia   . Kidney stone    kidney stones with lithotripsy  . Neuropathy involving both lower extremities   . Obesity, Class II, BMI 35-39.9, with comorbidity   . Paroxysmal atrial fibrillation ([place]) 2007   In June 2015, Cardiac Event Monitor: Mostly SR/sinus arrhythmia with PVCs that are frequent. Short bursts of A.  fib lasting several minutes;; CHA2DS2-VASc Score = 5 (age, Female, PVD, DM, HTN)  . Peripheral vascular disease ([place])   . Sleep apnea    use C-PAP  . Urinary incontinence   . Venous stasis dermatitis of both lower extremities     Review of systems:      Physical Exam    Heart and lungs: Regular rate and rhythm without rub or gallop, lungs are bilaterally clear.    HEENT: Normocephalic atraumatic eyes are anicteric    Other:     Pertinant exam for procedure: Soft nontender nondistended bowel sounds positive normoactive    Planned proceedures: Colonoscopy and indicated procedures. I have discussed the risks benefits and complications of procedures to include not limited to bleeding, infection, perforation and the risk of sedation and the patient wishes to proceed.    [name], MD Gastroenterology 02/22/2017  3:57 PM

## 2017-02-23 ENCOUNTER — Encounter: Payer: Self-pay | Admitting: Gastroenterology

## 2017-02-23 DIAGNOSIS — L97322 Non-pressure chronic ulcer of left ankle with fat layer exposed: Secondary | ICD-10-CM | POA: Diagnosis not present

## 2017-02-25 ENCOUNTER — Ambulatory Visit: Payer: PPO | Admitting: Family Medicine

## 2017-02-26 DIAGNOSIS — I10 Essential (primary) hypertension: Secondary | ICD-10-CM | POA: Diagnosis not present

## 2017-02-26 DIAGNOSIS — J45909 Unspecified asthma, uncomplicated: Secondary | ICD-10-CM | POA: Diagnosis not present

## 2017-02-26 DIAGNOSIS — R0602 Shortness of breath: Secondary | ICD-10-CM | POA: Diagnosis not present

## 2017-02-26 DIAGNOSIS — M129 Arthropathy, unspecified: Secondary | ICD-10-CM | POA: Diagnosis not present

## 2017-02-26 DIAGNOSIS — E069 Thyroiditis, unspecified: Secondary | ICD-10-CM | POA: Diagnosis not present

## 2017-02-26 DIAGNOSIS — L02611 Cutaneous abscess of right foot: Secondary | ICD-10-CM | POA: Diagnosis not present

## 2017-02-26 DIAGNOSIS — Z96619 Presence of unspecified artificial shoulder joint: Secondary | ICD-10-CM | POA: Diagnosis not present

## 2017-02-26 DIAGNOSIS — L0390 Cellulitis, unspecified: Secondary | ICD-10-CM | POA: Diagnosis not present

## 2017-03-02 ENCOUNTER — Other Ambulatory Visit: Payer: Self-pay | Admitting: Family Medicine

## 2017-03-02 IMAGING — MR MR ABDOMEN WO/W CM
18 of 19 series · 42 of 48 positions shown · IV contrast (20ML MULTIHANCE)
Comparison: MRI from 09/07/2012

CLINICAL DATA: Adrenal mass identified on ultrasound.

EXAM:
MRI ABDOMEN WITHOUT AND WITH CONTRAST
TECHNIQUE: Multiplanar multisequence MR imaging of the abdomen was performed
both before and after the administration of intravenous contrast.
CONTRAST:  20mL MULTIHANCE GADOBENATE DIMEGLUMINE 529 MG/ML IV SOLN

[Series 2: cor ssfse / · coronal · 7.0mm · 1.56mm/px · 1 of 30 slices shown]
[im 1/30]
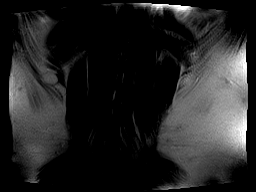

[Series 3: T2 · axial · 6.0mm · 1.48mm/px · 1 of 23 slices shown]
[im 1/23]
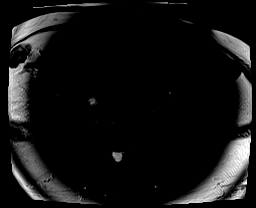

[Series 7: DWI · axial · 6.0mm · 2.00mm/px · z∈[-96,+63]mm · 4 of 69 slices shown (1 of 2)]
[im 1/69]
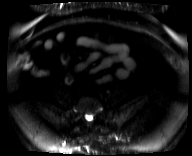
[im 23/69]
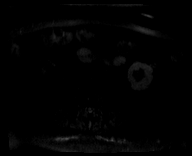
[im 46/69]
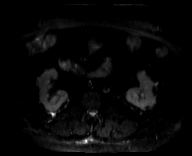
[im 69/69]
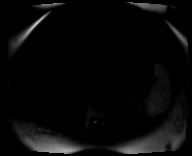

[Series 8: DWI · axial · 6.0mm · 2.00mm/px · 1 of 23 slices shown (2 of 2)]
[im 1/23]
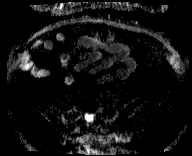

[Series 9: axial dynamic pre · axial · non-contrast · 4.0mm · 1.19mm/px · z∈[-94,+62]mm · 2 of 40 slices shown]
[im 1/40]
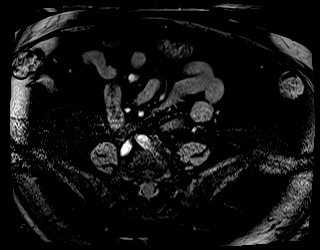
[im 40/40]
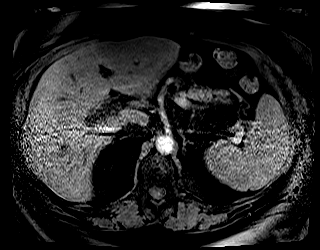

[Series 10: axial dynamic post · axial · 4.0mm · 1.19mm/px · z∈[-94,+62]mm · 2 of 40 slices shown (1 of 3)]
[im 1/40]
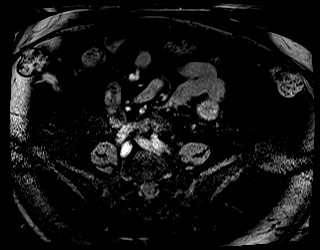
[im 40/40]
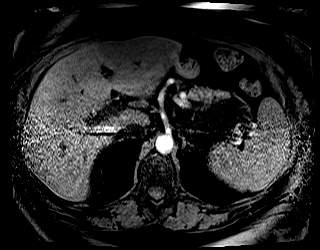

[Series 11: axial dynamic post · axial · 4.0mm · 1.19mm/px · z∈[-94,+62]mm · 3 of 40 slices shown (2 of 3)]
[im 1/40]
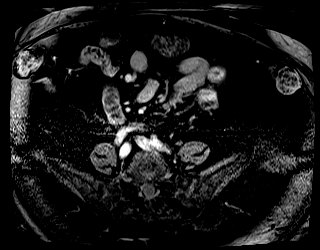
[im 20/40]
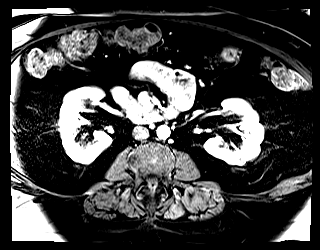
[im 40/40]
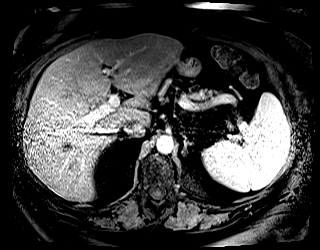

[Series 12: axial dynamic post · axial · 4.0mm · 1.19mm/px · z∈[-94,+62]mm · 3 of 40 slices shown (3 of 3)]
[im 1/40]
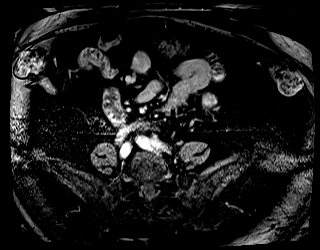
[im 20/40]
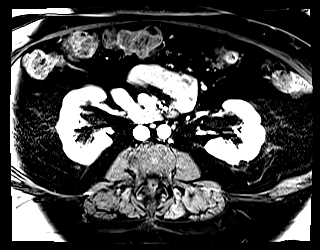
[im 40/40]
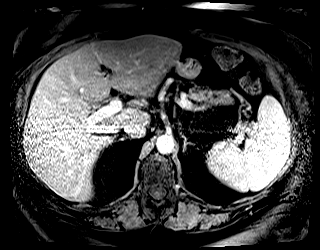

[Series 14: axial ssfse / · axial · 6.0mm · 1.19mm/px · z∈[-96,+63]mm · 2 of 23 slices shown]
[im 1/23]
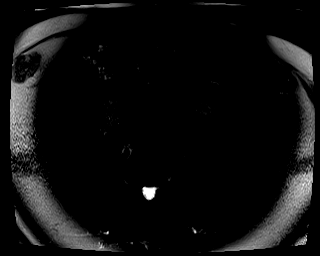
[im 23/23]
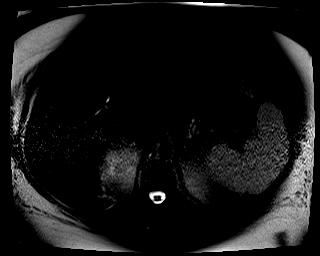

[Series 15: axial dynamic 3 · axial · 4.0mm · 1.19mm/px · z∈[-94,+62]mm · 3 of 40 slices shown]
[im 1/40]
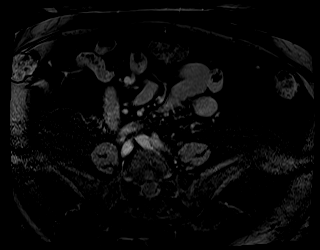
[im 20/40]
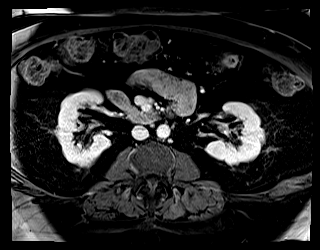
[im 40/40]
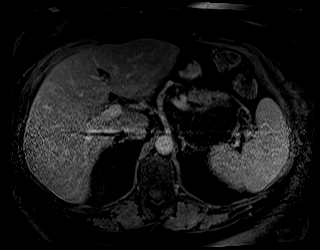

[Series 100: out of phase · axial · 6.0mm · 0.74mm/px · z∈[-96,+63]mm · 2 of 23 slices shown (1 of 2)]
[im 1/23]
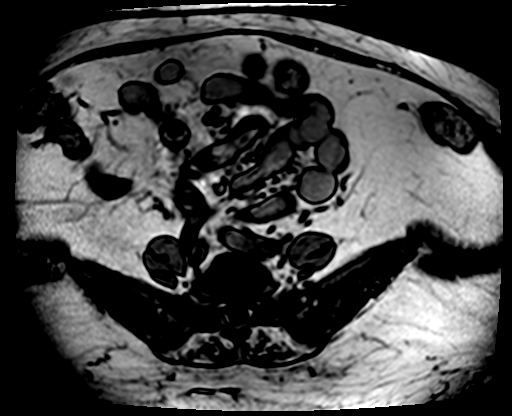
[im 23/23]
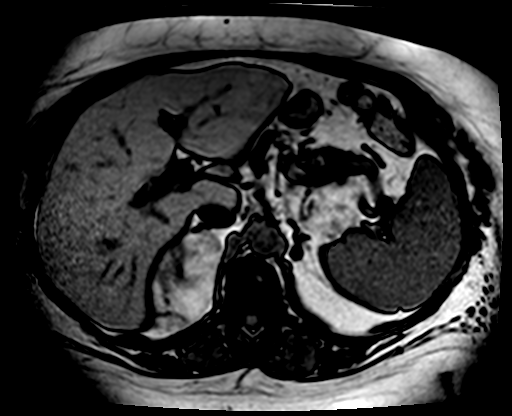

[Series 101: in phase axial · axial · 6.0mm · 0.74mm/px · z∈[-96,+63]mm · 2 of 23 slices shown]
[im 1/23]
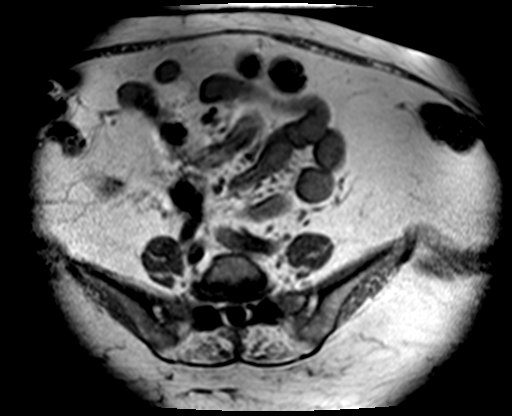
[im 23/23]
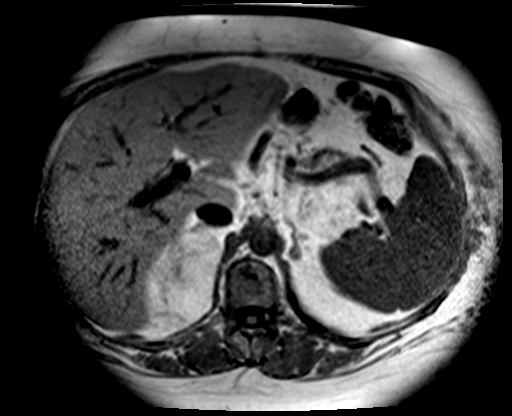

[Series 102: out of phase · coronal · 7.0mm · 0.78mm/px · 2 of 30 slices shown (2 of 2)]
[im 1/30]
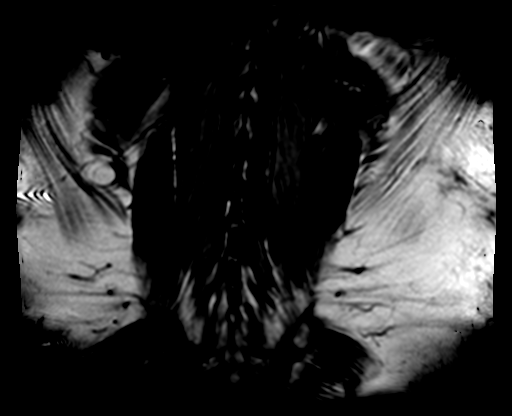
[im 30/30]
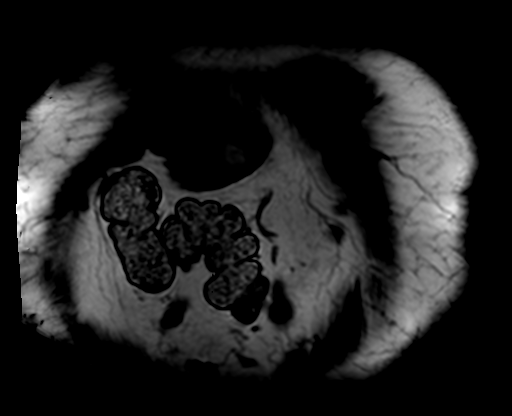

[Series 103: in phase cor · coronal · 7.0mm · 0.78mm/px · 2 of 30 slices shown]
[im 1/30]
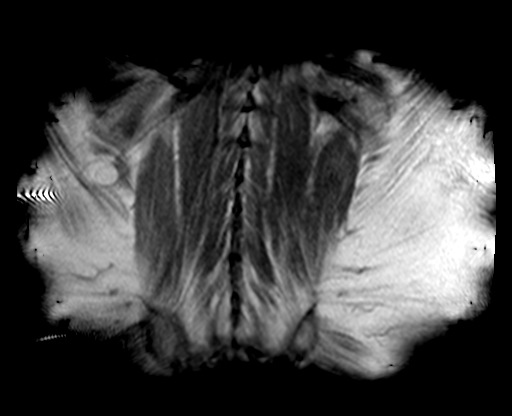
[im 30/30]
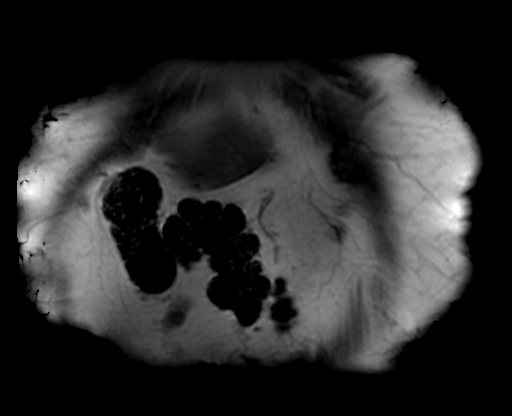

[Series 108: axial sub - · axial · 4.0mm · 1.19mm/px · z∈[-94,+62]mm · 3 of 40 slices shown (1 of 4)]
[im 1/40]
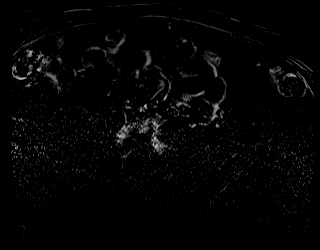
[im 20/40]
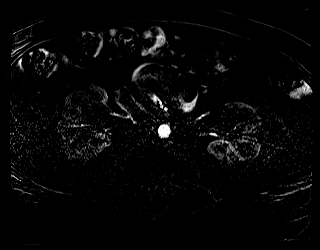
[im 40/40]
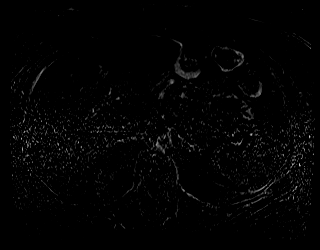

[Series 109: axial sub - · axial · 4.0mm · 1.19mm/px · z∈[-94,+62]mm · 3 of 40 slices shown (2 of 4)]
[im 1/40]
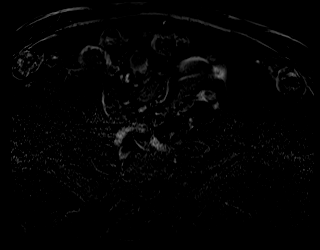
[im 20/40]
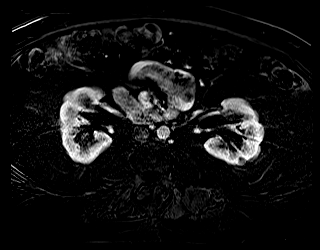
[im 40/40]
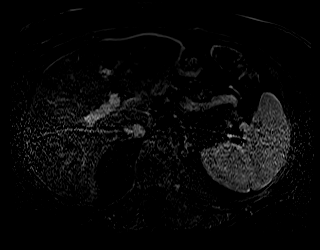

[Series 110: axial sub - · axial · 4.0mm · 1.19mm/px · z∈[-94,+62]mm · 3 of 40 slices shown (3 of 4)]
[im 1/40]
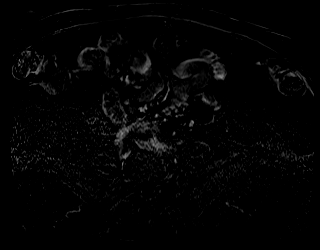
[im 20/40]
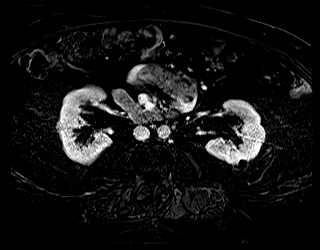
[im 40/40]
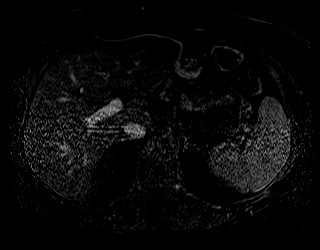

[Series 111: axial sub - · axial · 4.0mm · 1.19mm/px · z∈[-94,+62]mm · 3 of 40 slices shown (4 of 4)]
[im 1/40]
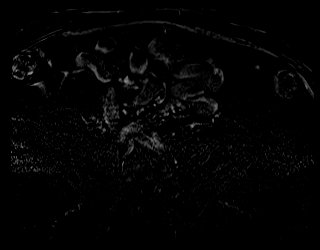
[im 20/40]
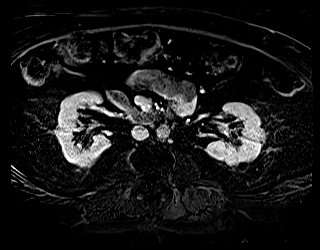
[im 40/40]
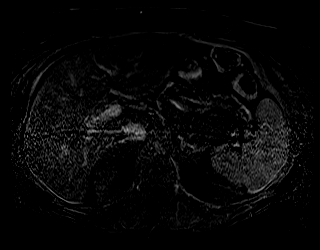

[42 of 48 positions shown; findings below may reference images not displayed]

FINDINGS: Lower chest:  No pleural effusions identified.

Hepatobiliary: The visualized portions of the liver appear normal.
No focal liver abnormality noted.

Pancreas: Unremarkable appearance of the pancreas.

Spleen: The visualized portions of the spleen appear normal.

Adrenals/Urinary Tract: There are large bilateral adrenal masses. As
previously these contain areas of macroscopic fat and most likely
represent adrenal myelolipomas. Index left adrenal mass measures
x 7.6 cm. Previously 9.1 x 7.6 cm. The right adrenal mass 8 x
cm. This is compared with 7.7 cm x 4.7 cm. Unremarkable appearance
of the right kidney. Small cyst in the left kidney is noted, similar
to previous exam.

Stomach/Bowel: The stomach and visualized upper abdominal bowel
loops are negative.

Vascular/Lymphatic: Normal caliber of the abdominal aorta. No upper
abdominal adenopathy.

Other: There is no free fluid or fluid collections identified within
the upper abdomen.

Musculoskeletal: Normal signal from within the bone marrow.
IMPRESSION: 1. Bilateral adrenal masses containing areas of macroscopic fat are
compatible with benign adrenal myelolipoma. When compared with study
from 08/23/2012 these demonstrate only minimal increase in size and
are compatible with a benign process.

## 2017-03-04 DIAGNOSIS — L97322 Non-pressure chronic ulcer of left ankle with fat layer exposed: Secondary | ICD-10-CM | POA: Diagnosis not present

## 2017-03-04 DIAGNOSIS — E1143 Type 2 diabetes mellitus with diabetic autonomic (poly)neuropathy: Secondary | ICD-10-CM | POA: Diagnosis not present

## 2017-03-05 DIAGNOSIS — G4733 Obstructive sleep apnea (adult) (pediatric): Secondary | ICD-10-CM | POA: Diagnosis not present

## 2017-03-05 IMAGING — CR DG CHEST 2V
1 series · 2 of 2 positions shown · non-contrast
Comparison: 08/12/2012.

CLINICAL DATA: Productive cough, crackling on the right side. Onset
[REDACTED]. Recent rhinorrhea.

EXAM:
CHEST  2 VIEW

[Series 1: dg chest 2 view · 0.14mm/px · 2 of 2 slices shown]
[im 1/2]
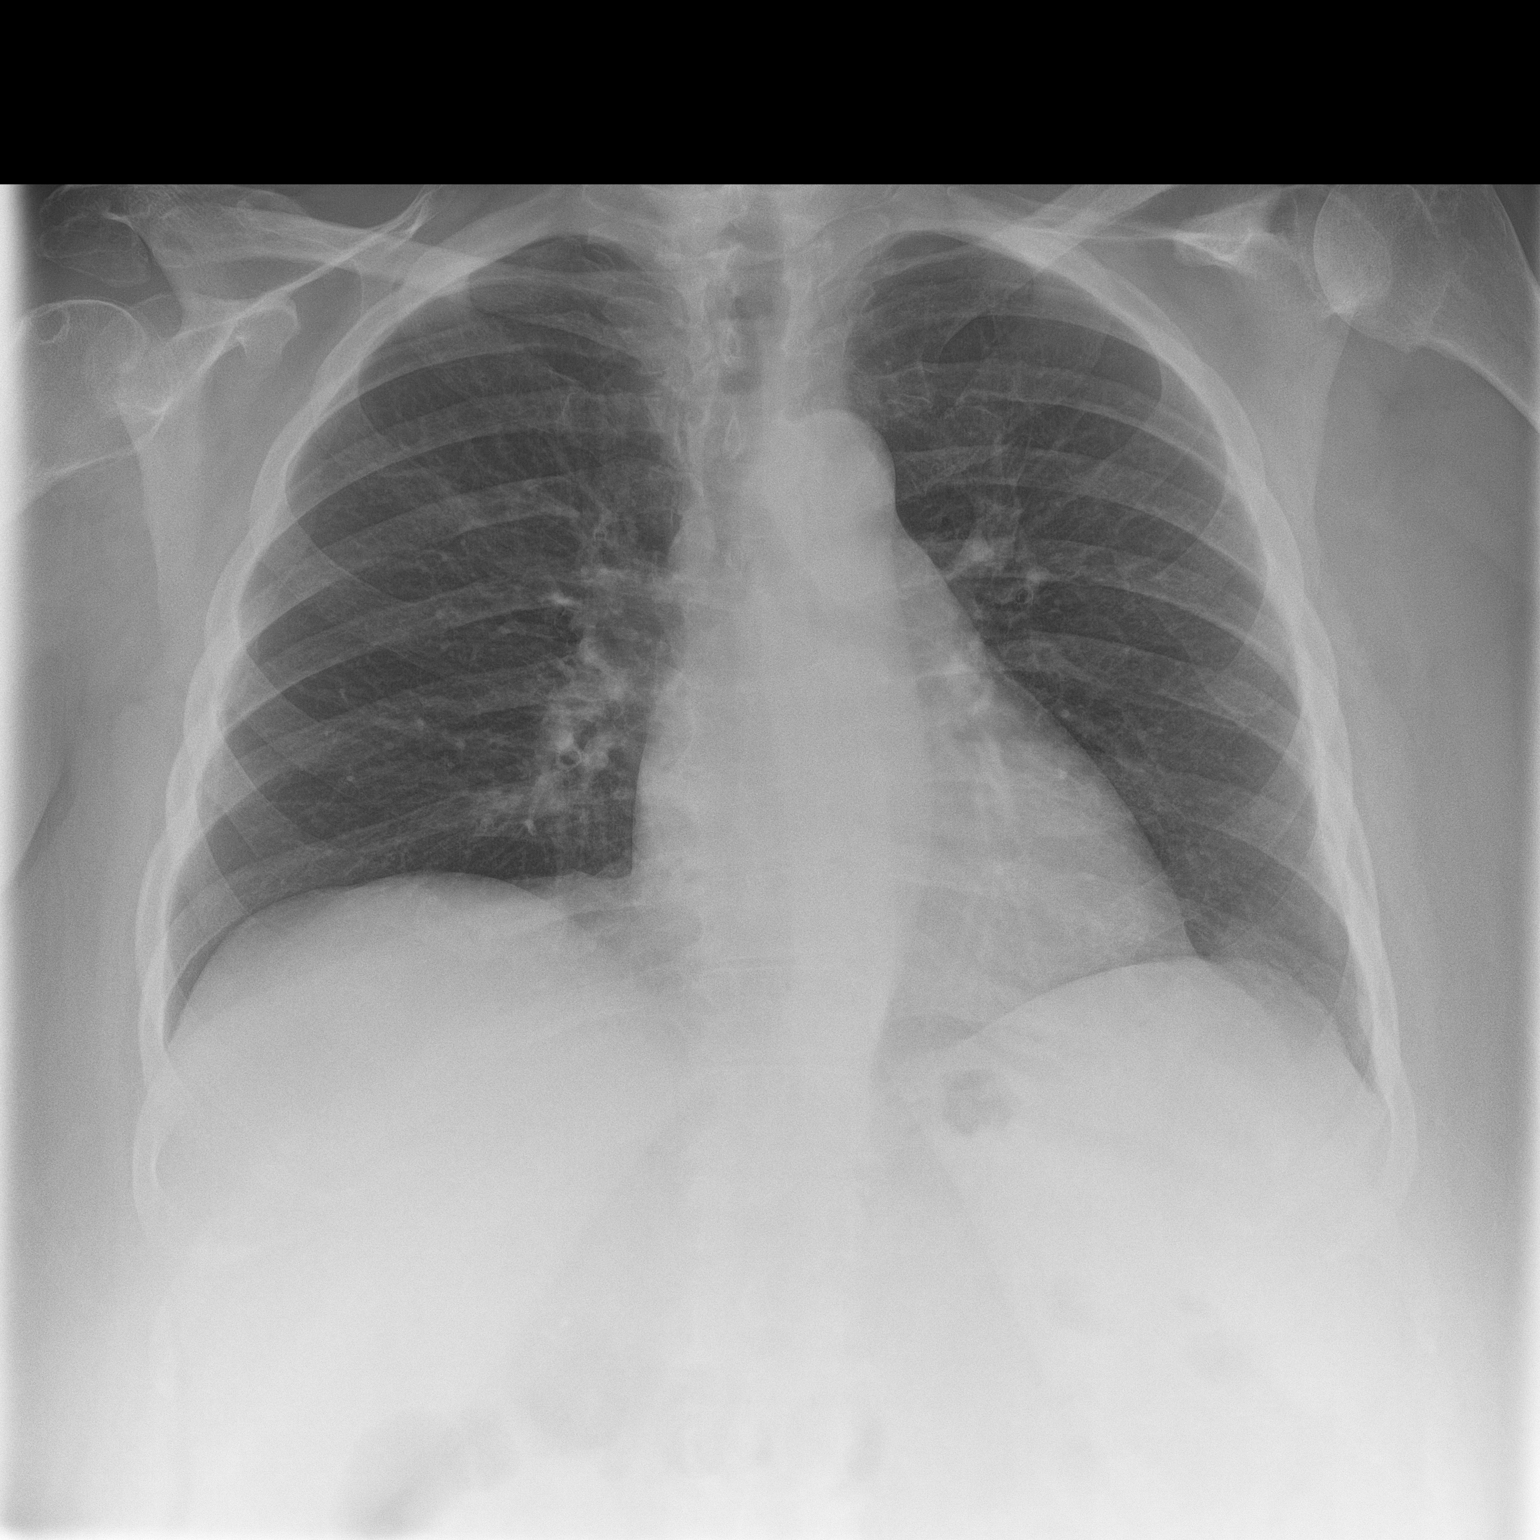
[im 2/2]
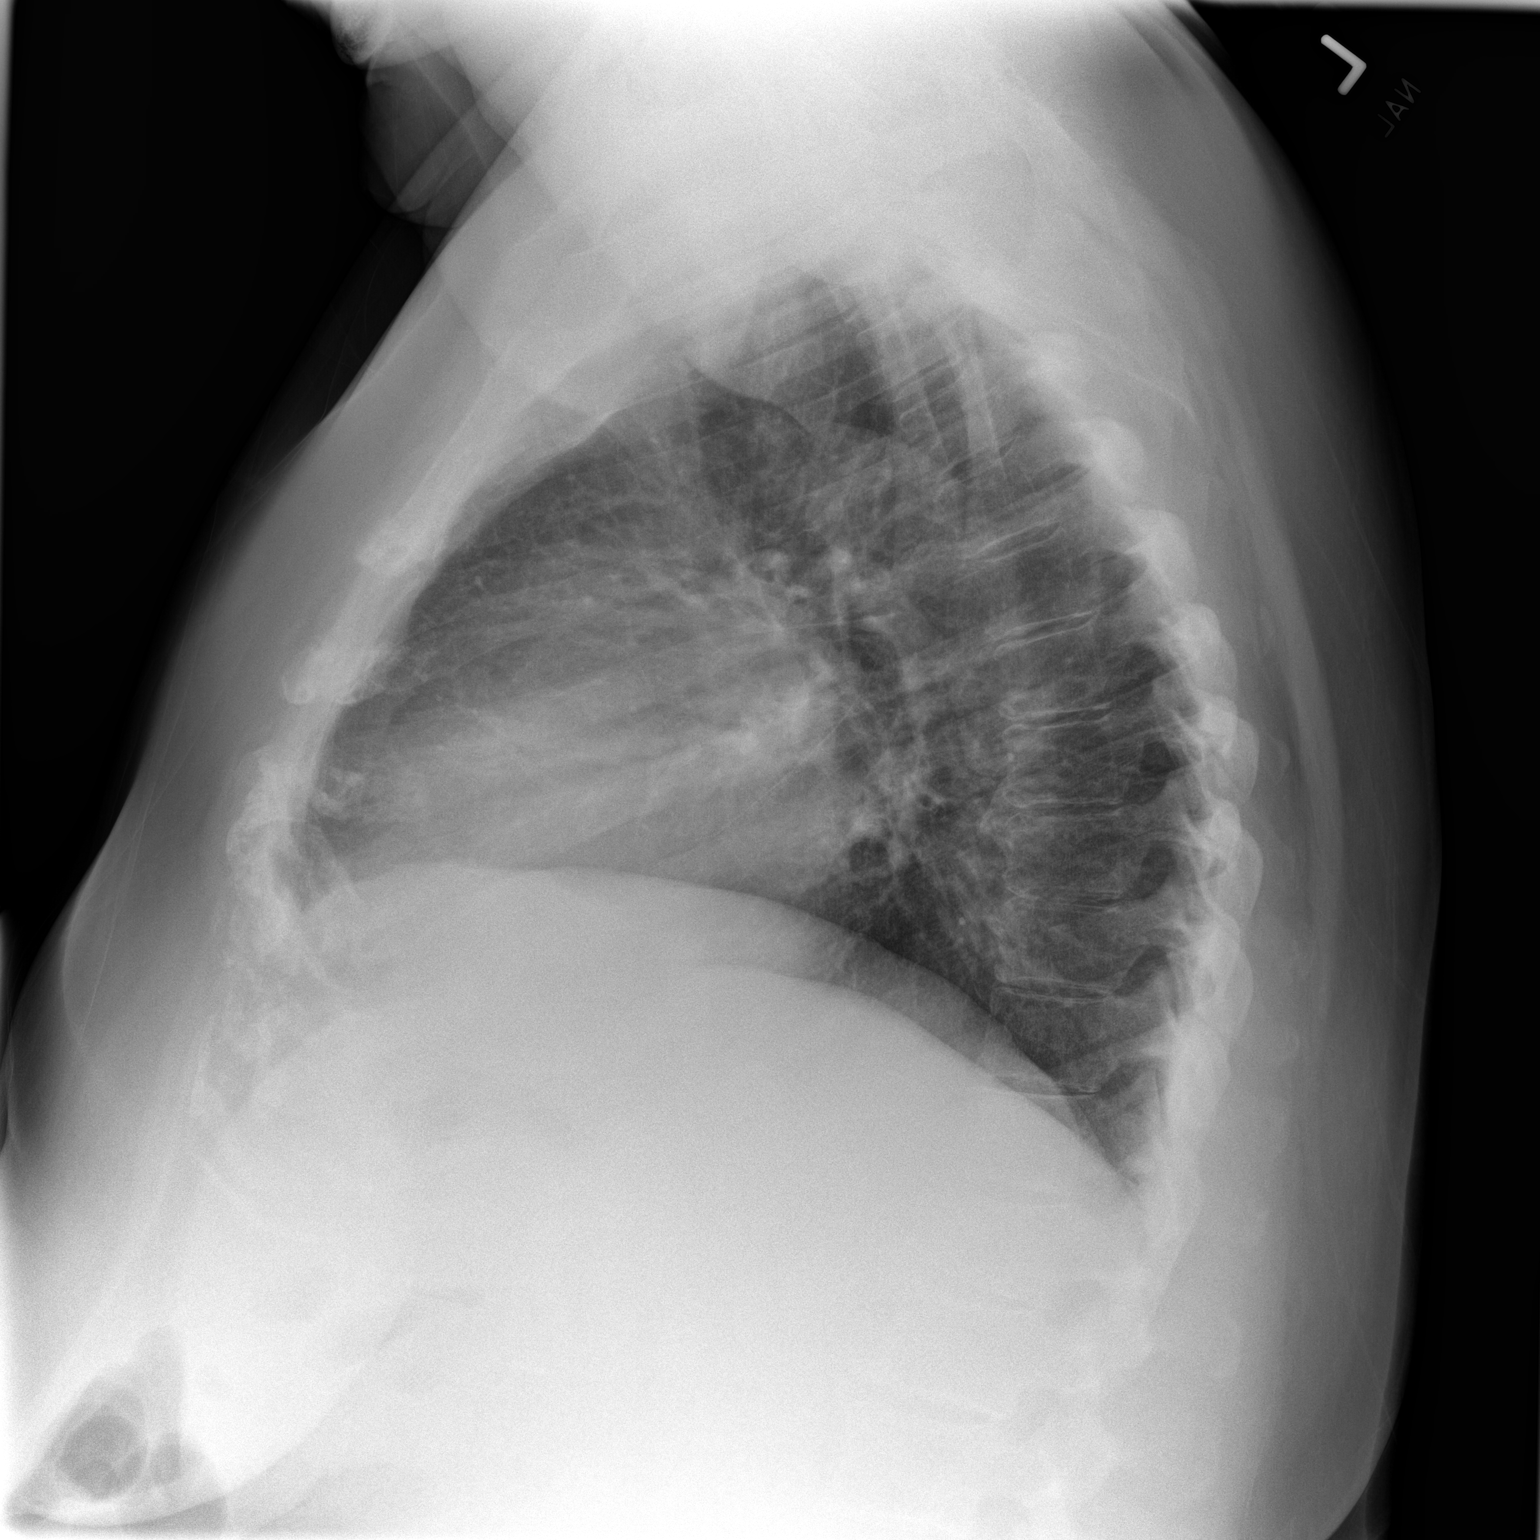

[2 of 2 positions shown; findings below may reference images not displayed]

FINDINGS: Trachea is midline. Heart size normal. Lungs are clear. No pleural
fluid.
IMPRESSION: No acute findings.

## 2017-03-19 ENCOUNTER — Ambulatory Visit: Payer: PPO | Admitting: Internal Medicine

## 2017-03-29 DIAGNOSIS — M129 Arthropathy, unspecified: Secondary | ICD-10-CM | POA: Diagnosis not present

## 2017-03-29 DIAGNOSIS — J45909 Unspecified asthma, uncomplicated: Secondary | ICD-10-CM | POA: Diagnosis not present

## 2017-03-29 DIAGNOSIS — I10 Essential (primary) hypertension: Secondary | ICD-10-CM | POA: Diagnosis not present

## 2017-03-29 DIAGNOSIS — R0602 Shortness of breath: Secondary | ICD-10-CM | POA: Diagnosis not present

## 2017-03-29 DIAGNOSIS — L0390 Cellulitis, unspecified: Secondary | ICD-10-CM | POA: Diagnosis not present

## 2017-03-29 DIAGNOSIS — L02611 Cutaneous abscess of right foot: Secondary | ICD-10-CM | POA: Diagnosis not present

## 2017-03-29 DIAGNOSIS — Z96619 Presence of unspecified artificial shoulder joint: Secondary | ICD-10-CM | POA: Diagnosis not present

## 2017-03-29 DIAGNOSIS — E069 Thyroiditis, unspecified: Secondary | ICD-10-CM | POA: Diagnosis not present

## 2017-03-31 DIAGNOSIS — L97511 Non-pressure chronic ulcer of other part of right foot limited to breakdown of skin: Secondary | ICD-10-CM | POA: Diagnosis not present

## 2017-03-31 DIAGNOSIS — E1143 Type 2 diabetes mellitus with diabetic autonomic (poly)neuropathy: Secondary | ICD-10-CM | POA: Diagnosis not present

## 2017-04-05 DIAGNOSIS — G4733 Obstructive sleep apnea (adult) (pediatric): Secondary | ICD-10-CM | POA: Diagnosis not present

## 2017-04-09 DIAGNOSIS — Z9181 History of falling: Secondary | ICD-10-CM | POA: Diagnosis not present

## 2017-04-09 DIAGNOSIS — M25552 Pain in left hip: Secondary | ICD-10-CM | POA: Diagnosis not present

## 2017-04-09 DIAGNOSIS — S76012A Strain of muscle, fascia and tendon of left hip, initial encounter: Secondary | ICD-10-CM | POA: Diagnosis not present

## 2017-04-14 ENCOUNTER — Ambulatory Visit: Payer: PPO | Admitting: Physical Therapy

## 2017-04-14 ENCOUNTER — Other Ambulatory Visit: Payer: Self-pay | Admitting: Family Medicine

## 2017-04-15 ENCOUNTER — Ambulatory Visit: Payer: PPO | Admitting: Internal Medicine

## 2017-04-15 DIAGNOSIS — Z0289 Encounter for other administrative examinations: Secondary | ICD-10-CM

## 2017-04-15 NOTE — Telephone Encounter (Signed)
Last OV 11/27/16 with Dr.Cook last filled 01/15/17 90 0rf

## 2017-04-15 NOTE — Telephone Encounter (Signed)
Okay to refill? (Former [employer] patient).  Pt has no showed or cancelled several appt.   No future appt scheduled.

## 2017-04-19 ENCOUNTER — Encounter: Payer: PPO | Admitting: Physical Therapy

## 2017-04-19 ENCOUNTER — Encounter: Payer: Self-pay | Admitting: Emergency Medicine

## 2017-04-19 ENCOUNTER — Emergency Department: Payer: PPO

## 2017-04-19 ENCOUNTER — Other Ambulatory Visit: Payer: Self-pay

## 2017-04-19 ENCOUNTER — Emergency Department
Admission: EM | Admit: 2017-04-19 | Discharge: 2017-04-19 | Disposition: A | Discharge status: Home / Self Care | Payer: PPO | Attending: Emergency Medicine | Admitting: Emergency Medicine

## 2017-04-19 DIAGNOSIS — T148XXA Other injury of unspecified body region, initial encounter: Secondary | ICD-10-CM | POA: Insufficient documentation

## 2017-04-19 DIAGNOSIS — J45909 Unspecified asthma, uncomplicated: Secondary | ICD-10-CM | POA: Insufficient documentation

## 2017-04-19 DIAGNOSIS — Y939 Activity, unspecified: Secondary | ICD-10-CM | POA: Diagnosis not present

## 2017-04-19 DIAGNOSIS — Y999 Unspecified external cause status: Secondary | ICD-10-CM | POA: Diagnosis not present

## 2017-04-19 DIAGNOSIS — S76011A Strain of muscle, fascia and tendon of right hip, initial encounter: Secondary | ICD-10-CM | POA: Diagnosis not present

## 2017-04-19 DIAGNOSIS — Z79899 Other long term (current) drug therapy: Secondary | ICD-10-CM | POA: Diagnosis not present

## 2017-04-19 DIAGNOSIS — Z96652 Presence of left artificial knee joint: Secondary | ICD-10-CM | POA: Insufficient documentation

## 2017-04-19 DIAGNOSIS — M79605 Pain in left leg: Secondary | ICD-10-CM | POA: Diagnosis present

## 2017-04-19 DIAGNOSIS — E039 Hypothyroidism, unspecified: Secondary | ICD-10-CM | POA: Diagnosis not present

## 2017-04-19 DIAGNOSIS — Y929 Unspecified place or not applicable: Secondary | ICD-10-CM | POA: Diagnosis not present

## 2017-04-19 DIAGNOSIS — E119 Type 2 diabetes mellitus without complications: Secondary | ICD-10-CM | POA: Insufficient documentation

## 2017-04-19 DIAGNOSIS — S76012A Strain of muscle, fascia and tendon of left hip, initial encounter: Secondary | ICD-10-CM | POA: Diagnosis not present

## 2017-04-19 DIAGNOSIS — Z7984 Long term (current) use of oral hypoglycemic drugs: Secondary | ICD-10-CM | POA: Diagnosis not present

## 2017-04-19 DIAGNOSIS — I10 Essential (primary) hypertension: Secondary | ICD-10-CM | POA: Insufficient documentation

## 2017-04-19 DIAGNOSIS — S7002XA Contusion of left hip, initial encounter: Secondary | ICD-10-CM | POA: Diagnosis not present

## 2017-04-19 DIAGNOSIS — W19XXXA Unspecified fall, initial encounter: Secondary | ICD-10-CM | POA: Diagnosis not present

## 2017-04-19 DIAGNOSIS — M7981 Nontraumatic hematoma of soft tissue: Secondary | ICD-10-CM | POA: Diagnosis not present

## 2017-04-19 DIAGNOSIS — R0602 Shortness of breath: Secondary | ICD-10-CM | POA: Diagnosis not present

## 2017-04-19 DIAGNOSIS — R05 Cough: Secondary | ICD-10-CM | POA: Diagnosis not present

## 2017-04-19 MED ORDER — OXYCODONE-ACETAMINOPHEN 5-325 MG PO TABS: 1.0000 | ORAL_TABLET | Freq: Four times a day (QID) | ORAL | 0 refills | Status: DC | PRN

## 2017-04-19 MED ORDER — GABAPENTIN 300 MG PO CAPS
900.0000 mg | ORAL_CAPSULE | Freq: Once | ORAL | Status: AC
Start: 2017-04-19 — End: 2017-04-19
  Administered 2017-04-19: 900 mg via ORAL
  Filled 2017-04-19 (×2): qty 3

## 2017-04-19 MED ORDER — OXYCODONE-ACETAMINOPHEN 5-325 MG PO TABS
1.0000 | ORAL_TABLET | Freq: Once | ORAL | Status: AC
  Administered 2017-04-19: 1 via ORAL
  Filled 2017-04-19: qty 1

## 2017-04-19 MED ORDER — KETOROLAC TROMETHAMINE 60 MG/2ML IM SOLN
30.0000 mg | Freq: Once | INTRAMUSCULAR | Status: AC
  Administered 2017-04-19: 30 mg via INTRAMUSCULAR
  Filled 2017-04-19: qty 2

## 2017-04-19 NOTE — ED Notes (Addendum)
Pt. Verbalizes understanding of d/c instructions, medications, and follow-up. VS stable.  Pt. In NAD at time of d/c and denies further concerns regarding this visit. Pt. Stable at the time of discharge with all belongings accounted for. Pt advised to return to the ED at any time for emergent concerns, or for new/worsening symptoms.   Pt attempting to find ride home

## 2017-04-19 NOTE — Discharge Instructions (Signed)
Please keep your appointment to see physical therapy in 2 days as scheduled.  Take your pain medication as needed for severe symptoms and follow-up with your primary care physician as needed.  Return to the emergency department for any concerns.  It was a pleasure to take care of you today, and thank you for coming to our emergency department.  If you have any questions or concerns before leaving please ask the nurse to grab me and I'm more than happy to go through your aftercare instructions again.  If you were prescribed any opioid pain medication today such as Norco, Vicodin, Percocet, morphine, hydrocodone, or oxycodone please make sure you do not drive when you are taking this medication as it can alter your ability to drive safely.  If you have any concerns once you are home that you are not improving or are in fact getting worse before you can make it to your follow-up appointment, please do not hesitate to call [phone] and come back for further evaluation.  Darel Hong, MD  Results for orders placed or performed during the hospital encounter of 02/22/17  Glucose, capillary  Result Value Ref Range   Glucose-Capillary 111 (H) 65 - 99 mg/dL   Dg Chest 1 View  Result Date: 04/19/2017 CLINICAL DATA:  Cough and shortness of breath EXAM: CHEST 1 VIEW COMPARISON:  Chest CT 09/30/2016 FINDINGS: Shallow lung inflation. The heart size and mediastinal contours are within normal limits. Both lungs are clear. Right shoulder arthroplasty. IMPRESSION: Shallow lung inflation without focal airspace disease. Electronically Signed   By: Ulyses Jarred M.D.   On: 04/19/2017 04:55   Ct Pelvis Wo Contrast  Result Date: 04/19/2017 CLINICAL DATA:  Continued left leg and hip pain 4 weeks after a fall. Redness and bruising to the lower legs. EXAM: CT PELVIS WITHOUT CONTRAST TECHNIQUE: Multidetector CT imaging of the pelvis was performed following the standard protocol without intravenous contrast. COMPARISON:  MRI  abdomen 12/14/2014. CT abdomen and pelvis 08/23/2012 FINDINGS: Urinary Tract: Bladder is decompressed. No bladder wall thickening or filling defect identified. Bowel: Visualized colon and small bowel are not abnormally distended and no inflammatory changes are appreciated. Appendix is normal. Vascular/Lymphatic: Iliac arteries and veins are not dilated. Reproductive: Uterus is surgically absent. No abnormal adnexal masses. Other: No free fluid in the pelvis pelvic wall musculature appears intact. Scarring along the midline consistent with surgical scarring. Musculoskeletal: There is infiltration in the subcutaneous fat over the left hip and left groin region with a small intramuscular hematoma within the distal psoas muscle. No evidence of acute fracture or dislocation. No destructive or expansile bone lesions. Degenerative changes in the lower lumbar spine. IMPRESSION: 1. No evidence of acute fracture or dislocation of the left hip. 2. Soft tissue injury to the left hip area with infiltration in the subcutaneous fat over the left hip and groin and small intramuscular hematoma in the distal psoas muscle. Muscular avulsion, contusion, or tear could be present. Electronically Signed   By: Lucienne Capers M.D.   On: 04/19/2017 05:47

## 2017-04-19 NOTE — ED Notes (Signed)
ED Provider at bedside. 

## 2017-04-19 NOTE — ED Triage Notes (Signed)
Pt bib [facility] from home d/t continued left leg/hip pain x4 weeks following fall. Pt has been seen by PCP multiple times d/t pain. Pt left lower leg reddened, bruising to bilateral lower legs. Wound to left heel, followed by Dr [name].

## 2017-04-19 NOTE — ED Notes (Signed)
Patient transported to X-ray 

## 2017-04-19 NOTE — ED Notes (Signed)
Family at bedside. 

## 2017-04-19 NOTE — ED Notes (Addendum)
Pt states taking 5mg hydrocodone/tylenol @0000  and again @0200 . Pt appears anxious.   Pt also c/o "bronchitis"

## 2017-04-19 NOTE — ED Provider Notes (Signed)
[facility] Emergency Department Provider Note  ____________________________________________   First MD Initiated Contact with Patient 04/19/17 [phone]     (approximate)  I have reviewed the triage vital signs and the nursing notes.   HISTORY  Chief Complaint Leg Pain (left)   HPI [name] is a 67 y.o. female who comes to the emergency department via EMS with severe left hip pain for the past 4 weeks.  Her symptoms began initially when she fell multiple times onto her left hip.  She was seen by her primary care physician who diagnosed her with a muscle strain and recommended exercises.  The exercises did not work and she subsequently was followed with her orthopedic surgeon who obtained x-rays which were negative and also recommended exercises.  The patient has been taking hydrocodone at home with some relief however at this evening her symptoms became severe and were unrelieved by 10 mg of hydrocodone so she called 911.  She is able to bear weight.  No numbness or weakness.  Her pain is in her left lateral hip.  It is nonradiating.  Past Medical History:  Diagnosis Date  . Abnormal antibody titer   . Anxiety and depression   . Arthritis   . Asthma   . Cystocele   . Depression   . Diabetes ([place])   . DVT (deep venous thrombosis) (HCC)    Righ calf  . Dysrhythmia   . Edema   . GERD (gastroesophageal reflux disease)   . Heart murmur    Echocardiogram June 2015: Mild MR (possible vegetation seen on TTE, not seen on TEE), normal LV size with moderate concentric LVH. Normal function EF 60-65%. Normal diastolic function. Mild LA dilation.  Marland Kitchen History of IBS   . History of methicillin resistant staphylococcus aureus (MRSA) 2008  . Hyperlipidemia   . Hypertension   . Hypothyroidism   . Insomnia   . Kidney stone    kidney stones with lithotripsy  . Neuropathy involving both lower extremities   . Obesity, Class II, BMI 35-39.9, with comorbidity   .  Paroxysmal atrial fibrillation ([place]) 2007   In June 2015, Cardiac Event Monitor: Mostly SR/sinus arrhythmia with PVCs that are frequent. Short bursts of A. fib lasting several minutes;; CHA2DS2-VASc Score = 5 (age, Female, PVD, DM, HTN)  . Peripheral vascular disease ([place])   . Sleep apnea    use C-PAP  . Urinary incontinence   . Venous stasis dermatitis of both lower extremities     Patient Active Problem List   Diagnosis Date Noted  . Fall with injury 02/02/2017  . Anemia 11/18/2016  . Asthma 11/18/2016  . Carpal tunnel syndrome 11/18/2016  . Pain in joint involving ankle and foot 11/18/2016  . Obstructive sleep apnea of adult 11/18/2016  . Spinal stenosis of thoracic region 11/18/2016  . Abscess of tendon of left foot 09/21/2016  . Cubital tunnel syndrome on right 08/07/2016  . Chronic pain syndrome 02/25/2016  . Long term current use of opiate analgesic 02/25/2016  . Chronic right shoulder pain 02/25/2016  . Status post reverse total shoulder replacement 10/08/2015  . 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 Morbid obesity with BMI of 40.0-44.9, adult ([place]) 09/23/2015  . Chronic prescription benzodiazepine use 09/23/2015  . Osteoarthritis, multiple sites 09/23/2015  . Other shoulder lesions (Right) 08/28/2015  . Cervical radiculitis 08/06/2015  . Opiate use (15 MME/Day) 01/16/2015  . Impingement syndrome of shoulder region 01/07/2015  . Mixed incontinence  11/26/2014  . Atrophic vaginitis 11/26/2014  . Anxiety and depression 09/21/2014  . Bladder cystocele 09/21/2014  . Gastro-esophageal reflux disease without esophagitis 09/21/2014  . DM (diabetes mellitus) type II, controlled, with peripheral vascular disorder ([place]) 08/31/2014  . Diabetic peripheral neuropathy associated with type 2 diabetes mellitus ([place]) 08/31/2014  . Asthma, moderate persistent, well-controlled 08/31/2014  . Hypothyroidism, adult 08/31/2014  . Peripheral vascular disease due to  secondary diabetes mellitus ([place]) 12/11/2013  . Apnea, sleep 10/19/2013  . Paroxysmal atrial fibrillation ([place]); CHA2DS2-Vasc Score 5. On Pradaxa 07/27/2013  . Essential hypertension 07/27/2013    Past Surgical History:  Procedure Laterality Date  . ANKLE SURGERY Right   . APPLICATION OF WOUND VAC Left 06/27/2015   Procedure: APPLICATION OF WOUND VAC ( POSSIBLE ) ;  Surgeon: [name], MD;  Location: [facility] ORS;  Service: Vascular;  Laterality: Left;  . APPLICATION OF WOUND VAC Left 09/25/2016   Procedure: APPLICATION OF WOUND VAC;  Surgeon: [name], DPM;  Location: [facility] ORS;  Service: Podiatry;  Laterality: Left;  . arm surgery     right    . CHOLECYSTECTOMY    . COLONOSCOPY    . COLONOSCOPY WITH PROPOFOL N/A 01/11/2017   Procedure: COLONOSCOPY WITH PROPOFOL;  Surgeon: [name], MD;  Location: [facility] ENDOSCOPY;  Service: Endoscopy;  Laterality: N/A;  . COLONOSCOPY WITH PROPOFOL N/A 02/22/2017   Procedure: COLONOSCOPY WITH PROPOFOL;  Surgeon: [name], MD;  Location: [facility] ENDOSCOPY;  Service: Endoscopy;  Laterality: N/A;  . ESOPHAGOGASTRODUODENOSCOPY (EGD) WITH PROPOFOL N/A 01/11/2017   Procedure: ESOPHAGOGASTRODUODENOSCOPY (EGD) WITH PROPOFOL;  Surgeon: [name], MD;  Location[facility] ENDOSCOPY;  Service: Endoscopy;  Laterality: N/A;  . HERNIA REPAIR     umbilical  . HIATAL HERNIA REPAIR    . I&D EXTREMITY Left 06/27/2015   Procedure: IRRIGATION AND DEBRIDEMENT EXTREMITY            ( CALF HEMATOMA ) POSSIBLE WOUND VAC;  Surgeon: [name], MD;  Location: [facility] ORS;  Service: Vascular;  Laterality: Left;  . INCISION AND DRAINAGE OF WOUND Left 09/25/2016   Procedure: IRRIGATION AND DEBRIDEMENT - PARTIAL RESECTION OF ACHILLES TENDON WITH WOUND VAC APPLICATION;  Surgeon: [name], DPM;  Location: [facility] ORS;  Service: Podiatry;  Laterality: Left;  . IRRIGATION AND DEBRIDEMENT ABSCESS Left 09/07/2016   Procedure: IRRIGATION AND DEBRIDEMENT ABSCESS LEFT HEEL;   Surgeon: [name], DPM;  Location: [facility] ORS;  Service: Podiatry;  Laterality: Left;  . JOINT REPLACEMENT     left knee replacement  . KIDNEY SURGERY Right    kidney stones  . LITHOTRIPSY    . NM [facility] HX)  February 2017   Likely breast attenuation. LOW RISK study. Normal EF 55-60%.  . removal of left hematoma Left    leg  . REVERSE SHOULDER ARTHROPLASTY Right 10/08/2015   Procedure: REVERSE SHOULDER ARTHROPLASTY;  Surgeon: [name], MD;  Location: [facility] ORS;  Service: Orthopedics;  Laterality: Right;  . SLEEVE GASTROPLASTY    . TONSILLECTOMY    . TOTAL KNEE ARTHROPLASTY    . TRANSESOPHAGEAL ECHOCARDIOGRAM  08/08/2013   Mild LVH, EF 60-65%. Moderate LA dilation and mild RA dilation. Mild MR with no evidence of stenosis and no evidence of endocarditis. A false chordae is noted.  . TRANSTHORACIC ECHOCARDIOGRAM  08/03/2013   Mild-Moderate concentric LVH, EF 60-65%. Normal diastolic function. Mild LA dilation. Mild MR with possible vegetation  - not confirmed on TEE   .  ULNAR NERVE TRANSPOSITION Right 08/06/2016   Procedure: ULNAR NERVE DECOMPRESSION/TRANSPOSITION;  Surgeon: [name], MD;  Location: [facility] ORS;  Service: Orthopedics;  Laterality: Right;  . UPPER GI ENDOSCOPY    . VAGINAL HYSTERECTOMY      Prior to Admission medications   Medication Sig Start Date End Date Taking? Authorizing Provider  albuterol (PROVENTIL HFA;VENTOLIN HFA) 108 (90 Base) MCG/ACT inhaler Inhale 1-2 puffs into the lungs every 4 (four) hours as needed for wheezing or shortness of breath. Patient taking differently: Inhale 2 puffs into the lungs every 4 (four) hours as needed for wheezing or shortness of breath.  03/29/15   Bobetta Lime, MD  albuterol (PROVENTIL) (2.5 MG/3ML) 0.083% nebulizer solution Take 3 mLs (2.5 mg total) by nebulization every 6 (six) hours as needed for wheezing or shortness of breath. 02/21/15   Ashok Norris, MD  ALPRAZolam Duanne Moron) 0.5 MG tablet TAKE 1  TABLET BY MOUTH TWICE DAILY AS NEEDED 02/02/17   Crecencio Mc, MD  buPROPion (WELLBUTRIN XL) 300 MG 24 hr tablet TAKE ONE TABLET BY MOUTH DAILY 04/15/17   [name], [name], MD  calcium carbonate (OSCAL) 1500 (600 Ca) MG TABS tablet Take 600 mg of elemental calcium by mouth 2 (two) times daily with a meal.    [provider]  cyanocobalamin 500 MCG tablet Take 500 mcg by mouth daily.    [provider]  dabigatran (PRADAXA) 150 MG CAPS capsule TAKE 1 CAPSULE BY MOUTH TWICE DAILY 04/24/16   Thersa Salt G, DO  dexlansoprazole (DEXILANT) 60 MG capsule Take 1 capsule (60 mg total) by mouth daily. MUST KEEP NEXT APPT FOR 90 DAY SUPPLY 02/17/17   Leone Haven, MD  DULoxetine (CYMBALTA) 60 MG capsule Take 1 capsule (60 mg total) by mouth daily. 03/14/17 06/12/17  Vevelyn Francois, NP  estradiol (ESTRACE) 0.1 MG/GM vaginal cream Place 1 Applicatorful vaginally 3 (three) times a week. 12/04/16   Zara Council A, PA-C  gabapentin (NEURONTIN) 800 MG tablet Take 1 tablet (800 mg total) by mouth 4 (four) times daily. 03/14/17 06/12/17  Vevelyn Francois, NP  levocetirizine (XYZAL) 5 MG tablet Take 1 tablet (5 mg total) by mouth every evening. 07/07/16   [name], DO  levothyroxine (SYNTHROID, LEVOTHROID) 150 MCG tablet TAKE 1 TABLET EVERY DAY ON EMPTY STOMACHWITH A GLASS OF WATER AT LEAST 30-60 MINBEFORE BREAKFAST 04/15/17   [name], [name], MD  meloxicam (MOBIC) 15 MG tablet Take 15 mg by mouth daily.  08/22/15   [provider]  metFORMIN (GLUCOPHAGE) 1000 MG tablet TAKE ONE TABLET BY MOUTH TWICE DAILY Patient taking differently: TAKE ONE TABLET IN THE MORNING AND HALF TABLET AT NIGHT 05/21/16   Crecencio Mc, MD  metoprolol tartrate (LOPRESSOR) 25 MG tablet TAKE 1 TABLET BY MOUTH TWICE DAILY 04/15/17   [name], [name], MD  mirabegron ER (MYRBETRIQ) 25 MG TB24 tablet Take 1 tablet (25 mg total) by mouth daily. 12/03/16   McGowan, [name] A, PA-C  montelukast  (SINGULAIR) 10 MG tablet TAKE ONE TABLET BY MOUTH EVERY DAY Patient taking differently: TAKE ONE TABLET BY MOUTH EVERY evening 04/24/16   [name], DO  Multiple Vitamin (MULTIVITAMIN WITH MINERALS) TABS tablet Take 1 tablet by mouth daily.    [provider]  ondansetron (ZOFRAN) 4 MG tablet Take 1 tablet (4 mg total) every 8 (eight) hours as needed by mouth for nausea or vomiting. 02/02/17   Venia Carbon, MD  oxybutynin (DITROPAN XL)  15 MG 24 hr tablet Take 1 tablet (15 mg total) by mouth at bedtime. 12/03/16   Zara Council A, PA-C  oxyCODONE-acetaminophen (PERCOCET/ROXICET) 5-325 MG tablet Take 1 tablet by mouth every 6 (six) hours as needed for severe pain. 04/19/17   Darel Hong, MD  quinapril (ACCUPRIL) 20 MG tablet Take 1 tablet (20 mg total) by mouth daily. MUST KEEP APPT THIS TIME FOR ADDITIONAL REFILLS 03/08/17   [name], [name], MD  sulfamethoxazole-trimethoprim (BACTRIM,SEPTRA) 400-80 MG tablet Take 1 tablet by mouth 2 (two) times daily.    [provider]  triamcinolone cream (KENALOG) 0.1 % Apply 1 application 2 (two) times daily as needed topically. 02/02/17   Venia Carbon, MD    Allergies Penicillins; Aspirin; Morphine and related; and Terramycin [oxytetracycline]  Family History  Problem Relation Age of Onset  . Skin cancer Father   . Diabetes Father   . Hypertension Father   . Peripheral vascular disease Father   . Cancer Father   . Cerebral aneurysm Father   . Alcohol abuse Father   . Varicose Veins Mother   . Kidney disease Mother   . Arthritis Mother   . Mental illness Sister   . Cancer Maternal Aunt        breast  . Breast cancer Maternal Aunt   . Arthritis Maternal Grandmother   . Hypertension Maternal Grandmother   . Diabetes Maternal Grandmother   . Arthritis Maternal Grandfather   . Heart disease Maternal Grandfather   . Hypertension Maternal Grandfather   . Arthritis Paternal Grandmother   . Hypertension  Paternal Grandmother   . Diabetes Paternal Grandmother   . Arthritis Paternal Grandfather   . Hypertension Paternal Grandfather   . Bladder Cancer Neg Hx   . Kidney cancer Neg Hx     Social History Social History   Tobacco Use  . Smoking status: Never Smoker  . Smokeless tobacco: Never Used  Substance Use Topics  . Alcohol use: No    Alcohol/week: 0.0 oz  . Drug use: No    Review of Systems Constitutional: No fever/chills Eyes: No visual changes. ENT: No sore throat. Cardiovascular: Denies chest pain. Respiratory: Denies shortness of breath. Gastrointestinal: No abdominal pain.  No nausea, no vomiting.  No diarrhea.  No constipation. Genitourinary: Negative for dysuria. Musculoskeletal: Positive for left hip pain Skin: Negative for rash. Neurological: Negative for headaches, focal weakness or numbness.   ____________________________________________   PHYSICAL EXAM:  VITAL SIGNS: ED Triage Vitals  Enc Vitals Group     BP      Pulse      Resp      Temp      Temp src      SpO2      Weight      Height      Head Circumference      Peak Flow      Pain Score      Pain Loc      Pain Edu?      Excl. in [place]?     Constitutional: Alert and oriented x4 wincing and uncomfortable appearing nontoxic no diaphoresis speaks in full clear sentences Eyes: PERRL EOMI. Head: Atraumatic. Nose: No congestion/rhinnorhea. Mouth/Throat: No trismus Neck: No stridor.   Cardiovascular: Normal rate, regular rhythm. Grossly normal heart sounds.  Good peripheral circulation. Respiratory: Normal respiratory effort.  No retractions. Lungs CTAB and moving good air Gastrointestinal: Obese abdomen soft nontender Musculoskeletal: Legs are equal in length.  No rotation.  Exquisitely tender over left lateral hip.  Neurovascularly intact Neurologic:  Normal speech and language. No gross focal neurologic deficits are appreciated. Skin:  Skin is warm, dry and intact. No rash  noted. Psychiatric: Mood and affect are normal. Speech and behavior are normal.    ____________________________________________   DIFFERENTIAL includes but not limited to  Hip fracture, hip dislocation, musculoskeletal strain ____________________________________________   LABS (all labs ordered are listed, but only abnormal results are displayed)  Labs Reviewed - No data to display   __________________________________________  EKG   ____________________________________________  RADIOLOGY  Chest x-ray reviewed by me with no acute disease CT scan of the pelvis reviewed by me with no bony fracture or dislocation but does show soft tissue swelling and hematoma in the left lateral hip ____________________________________________   PROCEDURES  Procedure(s) performed: no  Procedures  Critical Care performed: no  Observation: no ____________________________________________   INITIAL IMPRESSION / ASSESSMENT AND PLAN / ED COURSE  Pertinent labs & imaging results that were available during my care of the patient were reviewed by me and considered in my medical decision making (see chart for details).  The patient arrives with 1 month of intermittent progressive pain in the left lateral hip after a fall.  She is neurovascularly intact.  Previous x-ray was normal but concern for occult fracture so CT scan obtained today.  Fortunately no fracture noted but she does have evidence of muscular strain as well as hematoma.  Pain adequately controlled after single dose of Toradol and gabapentin.  She is a chronic pain patient and takes hydrocodone normally however we will increase her to oxycodone for the next several days.  She has physical therapy follow-up in less than 48 hours.  At this point she is medically stable for outpatient management.  Strict return precautions have been given and the patient verbalized understanding and agreement with the plan.       ____________________________________________   FINAL CLINICAL IMPRESSION(S) / ED DIAGNOSES  Final diagnoses:  Muscle strain  Intramuscular hematoma      NEW MEDICATIONS STARTED DURING THIS VISIT:  New Prescriptions   OXYCODONE-ACETAMINOPHEN (PERCOCET/ROXICET) 5-325 MG TABLET    Take 1 tablet by mouth every 6 (six) hours as needed for severe pain.     Note:  This document was prepared using Dragon voice recognition software and may include unintentional dictation errors.     Darel Hong, MD 04/19/17 [phone]

## 2017-04-20 ENCOUNTER — Ambulatory Visit (INDEPENDENT_AMBULATORY_CARE_PROVIDER_SITE_OTHER): Payer: PPO | Admitting: Internal Medicine

## 2017-04-20 ENCOUNTER — Encounter: Payer: Self-pay | Admitting: Internal Medicine

## 2017-04-20 ENCOUNTER — Other Ambulatory Visit: Payer: Self-pay

## 2017-04-20 VITALS — BP 158/80 | HR 85 | Temp 98.0°F | Ht 64.5 in | Wt 262.2 lb

## 2017-04-20 DIAGNOSIS — M25552 Pain in left hip: Secondary | ICD-10-CM

## 2017-04-20 DIAGNOSIS — Z13818 Encounter for screening for other digestive system disorders: Secondary | ICD-10-CM

## 2017-04-20 DIAGNOSIS — J4540 Moderate persistent asthma, uncomplicated: Secondary | ICD-10-CM

## 2017-04-20 DIAGNOSIS — E559 Vitamin D deficiency, unspecified: Secondary | ICD-10-CM

## 2017-04-20 DIAGNOSIS — I480 Paroxysmal atrial fibrillation: Secondary | ICD-10-CM | POA: Diagnosis not present

## 2017-04-20 DIAGNOSIS — E119 Type 2 diabetes mellitus without complications: Secondary | ICD-10-CM | POA: Diagnosis not present

## 2017-04-20 DIAGNOSIS — E1143 Type 2 diabetes mellitus with diabetic autonomic (poly)neuropathy: Secondary | ICD-10-CM | POA: Diagnosis not present

## 2017-04-20 DIAGNOSIS — E538 Deficiency of other specified B group vitamins: Secondary | ICD-10-CM

## 2017-04-20 DIAGNOSIS — G894 Chronic pain syndrome: Secondary | ICD-10-CM | POA: Diagnosis not present

## 2017-04-20 DIAGNOSIS — E039 Hypothyroidism, unspecified: Secondary | ICD-10-CM | POA: Diagnosis not present

## 2017-04-20 DIAGNOSIS — R269 Unspecified abnormalities of gait and mobility: Secondary | ICD-10-CM | POA: Diagnosis not present

## 2017-04-20 DIAGNOSIS — Z1159 Encounter for screening for other viral diseases: Secondary | ICD-10-CM

## 2017-04-20 DIAGNOSIS — L97511 Non-pressure chronic ulcer of other part of right foot limited to breakdown of skin: Secondary | ICD-10-CM | POA: Diagnosis not present

## 2017-04-20 DIAGNOSIS — I10 Essential (primary) hypertension: Secondary | ICD-10-CM

## 2017-04-20 MED ORDER — OXYCODONE-ACETAMINOPHEN 5-325 MG PO TABS: 1.0000 | ORAL_TABLET | Freq: Four times a day (QID) | ORAL | 0 refills | Status: DC | PRN

## 2017-04-20 MED ORDER — QUINAPRIL HCL 40 MG PO TABS
40.0000 mg | ORAL_TABLET | Freq: Every day | ORAL | 0 refills | Status: DC
Start: 2017-04-20 — End: 2017-07-16

## 2017-04-20 NOTE — Patient Instructions (Addendum)
Please try Robitussin DM or Mucinex DM for cough  Ask Dr. Raul Del about Spiriva since you are not consistently taking Advair    Hypertension Hypertension is another name for high blood pressure. High blood pressure forces your heart to work harder to pump blood. This can cause problems over time. There are two numbers in a blood pressure reading. There is a top number (systolic) over a bottom number (diastolic). It is best to have a blood pressure below 120/80. Healthy choices can help lower your blood pressure. You may need medicine to help lower your blood pressure if:  Your blood pressure cannot be lowered with healthy choices.  Your blood pressure is higher than 130/80.  Follow these instructions at home: Eating and drinking  If directed, follow the DASH eating plan. This diet includes: ? Filling half of your plate at each meal with fruits and vegetables. ? Filling one quarter of your plate at each meal with whole grains. Whole grains include whole wheat pasta, brown rice, and whole grain bread. ? Eating or drinking low-fat dairy products, such as skim milk or low-fat yogurt. ? Filling one quarter of your plate at each meal with low-fat (lean) proteins. Low-fat proteins include fish, skinless chicken, eggs, beans, and tofu. ? Avoiding fatty meat, cured and processed meat, or chicken with skin. ? Avoiding premade or processed food.  Eat less than 1,500 mg of salt (sodium) a day.  Limit alcohol use to no more than 1 drink a day for nonpregnant women and 2 drinks a day for men. One drink equals 12 oz of beer, 5 oz of wine, or 1 oz of hard liquor. Lifestyle  Work with your doctor to stay at a healthy weight or to lose weight. Ask your doctor what the best weight is for you.  Get at least 30 minutes of exercise that causes your heart to beat faster (aerobic exercise) most days of the week. This may include walking, swimming, or biking.  Get at least 30 minutes of exercise that  strengthens your muscles (resistance exercise) at least 3 days a week. This may include lifting weights or pilates.  Do not use any products that contain nicotine or tobacco. This includes cigarettes and e-cigarettes. If you need help quitting, ask your doctor.  Check your blood pressure at home as told by your doctor.  Keep all follow-up visits as told by your doctor. This is important. Medicines  Take over-the-counter and prescription medicines only as told by your doctor. Follow directions carefully.  Do not skip doses of blood pressure medicine. The medicine does not work as well if you skip doses. Skipping doses also puts you at risk for problems.  Ask your doctor about side effects or reactions to medicines that you should watch for. Contact a doctor if:  You think you are having a reaction to the medicine you are taking.  You have headaches that keep coming back (recurring).  You feel dizzy.  You have swelling in your ankles.  You have trouble with your vision. Get help right away if:  You get a very bad headache.  You start to feel confused.  You feel weak or numb.  You feel faint.  You get very bad pain in your: ? Chest. ? Belly (abdomen).  You throw up (vomit) more than once.  You have trouble breathing. Summary  Hypertension is another name for high blood pressure.  Making healthy choices can help lower blood pressure. If your blood pressure cannot be  controlled with healthy choices, you may need to take medicine. This information is not intended to replace advice given to you by your health care provider. Make sure you discuss any questions you have with your health care provider. Document Released: 08/26/2007 Document Revised: 02/05/2016 Document Reviewed: 02/05/2016 Elsevier Interactive Patient Education  2018 [employer]. Hip Pain The hip is the joint between the upper legs and the lower pelvis. The bones, cartilage, tendons, and muscles of your hip  joint support your body and allow you to move around. Hip pain can range from a minor ache to severe pain in one or both of your hips. The pain may be felt on the inside of the hip joint near the groin, or the outside near the buttocks and upper thigh. You may also have swelling or stiffness. Follow these instructions at home: Managing pain, stiffness, and swelling  If directed, apply ice to the injured area. ? Put ice in a plastic bag. ? Place a towel between your skin and the bag. ? Leave the ice on for 20 minutes, 2-3 times a day  Sleep with a pillow between your legs on your most comfortable side.  Avoid any activities that cause pain. General instructions  Take over-the-counter and prescription medicines only as told by your health care provider.  Do any exercises as told by your health care provider.  Record the following: ? How often you have hip pain. ? The location of your pain. ? What the pain feels like. ? What makes the pain worse.  Keep all follow-up visits as told by your health care provider. This is important. Contact a health care provider if:  You cannot put weight on your leg.  Your pain or swelling continues or gets worse after one week.  It gets harder to walk.  You have a fever. Get help right away if:  You fall.  You have a sudden increase in pain and swelling in your hip.  Your hip is red or swollen or very tender to touch. Summary  Hip pain can range from a minor ache to severe pain in one or both of your hips.  The pain may be felt on the inside of the hip joint near the groin, or the outside near the buttocks and upper thigh.  Avoid any activities that cause pain.  Record how often you have hip pain, the location of the pain, what makes it worse and what it feels like. This information is not intended to replace advice given to you by your health care provider. Make sure you discuss any questions you have with your health care  provider. Document Released: 08/27/2009 Document Revised: 02/10/2016 Document Reviewed: 02/10/2016 Elsevier Interactive Patient Education  2018 [employer].    Asthma, Adult Asthma is a condition of the lungs in which the airways tighten and narrow. Asthma can make it hard to breathe. Asthma cannot be cured, but medicine and lifestyle changes can help control it. Asthma may be started (triggered) by:  Animal skin flakes (dander).  Dust.  Cockroaches.  Pollen.  Mold.  Smoke.  Cleaning products.  Hair sprays or aerosol sprays.  Paint fumes or strong smells.  Cold air, weather changes, and winds.  Crying or laughing hard.  Stress.  Certain medicines or drugs.  Foods, such as dried fruit, potato chips, and sparkling grape juice.  Infections or conditions (colds, flu).  Exercise.  Certain medical conditions or diseases.  Exercise or tiring activities.  Follow these instructions at  home:  Take medicine as told by your doctor.  Use a peak flow meter as told by your doctor. A peak flow meter is a tool that measures how well the lungs are working.  Record and keep track of the peak flow meter's readings.  Understand and use the asthma action plan. An asthma action plan is a written plan for taking care of your asthma and treating your attacks.  To help prevent asthma attacks: ? Do not smoke. Stay away from secondhand smoke. ? Change your heating and air conditioning filter often. ? Limit your use of fireplaces and wood stoves. ? Get rid of pests (such as roaches and mice) and their droppings. ? Throw away plants if you see mold on them. ? Clean your floors. Dust regularly. Use cleaning products that do not smell. ? Have someone vacuum when you are not home. Use a vacuum cleaner with a HEPA filter if possible. ? Replace carpet with wood, tile, or vinyl flooring. Carpet can trap animal skin flakes and dust. ? Use allergy-proof pillows, mattress covers, and box  spring covers. ? Wash bed sheets and blankets every week in hot water and dry them in a dryer. ? Use blankets that are made of polyester or cotton. ? Clean bathrooms and kitchens with bleach. If possible, have someone repaint the walls in these rooms with mold-resistant paint. Keep out of the rooms that are being cleaned and painted. ? Wash hands often. Contact a doctor if:  You have make a whistling sound when breaking (wheeze), have shortness of breath, or have a cough even if taking medicine to prevent attacks.  The colored mucus you cough up (sputum) is thicker than usual.  The colored mucus you cough up changes from clear or white to yellow, green, gray, or bloody.  You have problems from the medicine you are taking such as: ? A rash. ? Itching. ? Swelling. ? Trouble breathing.  You need reliever medicines more than 2-3 times a week.  Your peak flow measurement is still at 50-79% of your personal best after following the action plan for 1 hour.  You have a fever. Get help right away if:  You seem to be worse and are not responding to medicine during an asthma attack.  You are short of breath even at rest.  You get short of breath when doing very little activity.  You have trouble eating, drinking, or talking.  You have chest pain.  You have a fast heartbeat.  Your lips or fingernails start to turn blue.  You are light-headed, dizzy, or faint.  Your peak flow is less than 50% of your personal best. This information is not intended to replace advice given to you by your health care provider. Make sure you discuss any questions you have with your health care provider. Document Released: 08/26/2007 Document Revised: 08/15/2015 Document Reviewed: 10/06/2012 Elsevier Interactive Patient Education  2017 [employer].

## 2017-04-20 NOTE — Progress Notes (Signed)
Pre visit review using our clinic review tool, if applicable. No additional management support is needed unless otherwise documented below in the visit note. 

## 2017-04-21 ENCOUNTER — Ambulatory Visit: Payer: PPO | Admitting: Physical Therapy

## 2017-04-21 ENCOUNTER — Telehealth: Payer: Self-pay | Admitting: Pain Medicine

## 2017-04-21 NOTE — Telephone Encounter (Signed)
Saw Dr. Roland Rack, on Friday orthopedic and he gave her some  Hydrocodone 7.5 meds for a inter muscular hemotoma,  Then she went to ED and got percocet 5.325. She saw PCP on 04-20-17 and was still in pain and got some more percocet. Wanted to call and let us know what is going on with meds. She stopped taking oxycodone. Was afraid of taking too much meds. Still in extreme pain. Please call and advise if she should still be taking the meds from pain clinic and then the percocets as break thru.

## 2017-04-22 ENCOUNTER — Encounter: Payer: Self-pay | Admitting: Internal Medicine

## 2017-04-22 ENCOUNTER — Encounter: Payer: PPO | Admitting: Physical Therapy

## 2017-04-22 DIAGNOSIS — M25552 Pain in left hip: Secondary | ICD-10-CM | POA: Insufficient documentation

## 2017-04-22 DIAGNOSIS — R269 Unspecified abnormalities of gait and mobility: Secondary | ICD-10-CM | POA: Insufficient documentation

## 2017-04-22 NOTE — Progress Notes (Signed)
Chief Complaint  Patient presents with  . Follow-up    Transferr of care   Follow up multiple problems. ED f/u for left hip pain and falls  1. C/o left hip pain s/p falls her 1st fall she slipped in the bathtub, the 2nd fall her walker got stuck one of the prongs in a hole causing her to fall, and then she fell 1 week ago on a slick floor at the hair salon. She saw dr. Roland Rack about left hip pain and Xray was neg and told to f/u in 6 weeks. Pain is 8/10. She is having trouble walking and this makes pain worse. She is having trouble sitting to standing.  she went to ED CT hip done + Soft tissue injury to the left hip area with infiltration in the subcutaneous fat over the left hip and groin and small intramuscular hematoma in the distal psoas muscle. Muscular avulsion, contusion, or tear could be present. Currently she is in severe pain and walking with cane. She is established with pain clinic and will call them today about outside narcotics Rx. She has a walker at home to use prn and she has PT schedule tomorrow [facility] 2. HTN elevated today on Lopressor 25 bid, Quinapril 20 mg qd  3. H/o bariatric surgery since surgery was 369 down to 252 # but since pain mobility limited.   4. C/o h/o asthma and cough with yellow green phlegm she reports she gets bronchitis at times. On prn Proair, not using Advair due to c/w yeast in mouth with use Her lung MD is Dr. Raul Del upcoming appt 05/2017 disc Spiriva as option for asthma. Nothing for cough tried.    Review of Systems  Constitutional: Negative for fever and weight loss.  HENT: Negative for hearing loss.   Respiratory: Positive for cough.   Cardiovascular: Positive for palpitations.  Gastrointestinal: Negative for abdominal pain.  Genitourinary:       +urinary incontinence   Musculoskeletal: Positive for falls.  Skin: Negative for rash.  Neurological: Positive for focal weakness.       Legs weak  Psychiatric/Behavioral: The patient is nervous/anxious.     Past Medical History:  Diagnosis Date  . Abnormal antibody titer   . Anxiety and depression   . Arthritis   . Asthma   . Cystocele   . Depression   . Diabetes ([place])   . DVT (deep venous thrombosis) (HCC)    Righ calf  . Dysrhythmia   . Edema   . GERD (gastroesophageal reflux disease)   . Heart murmur    Echocardiogram June 2015: Mild MR (possible vegetation seen on TTE, not seen on TEE), normal LV size with moderate concentric LVH. Normal function EF 60-65%. Normal diastolic function. Mild LA dilation.  Marland Kitchen History of IBS   . History of methicillin resistant staphylococcus aureus (MRSA) 2008  . Hyperlipidemia   . Hypertension   . Hypothyroidism   . Insomnia   . Kidney stone    kidney stones with lithotripsy  . Neuropathy involving both lower extremities   . Obesity, Class II, BMI 35-39.9, with comorbidity   . Paroxysmal atrial fibrillation ([place]) 2007   In June 2015, Cardiac Event Monitor: Mostly SR/sinus arrhythmia with PVCs that are frequent. Short bursts of A. fib lasting several minutes;; CHA2DS2-VASc Score = 5 (age, Female, PVD, DM, HTN)  . Peripheral vascular disease ([place])   . Sleep apnea    use C-PAP  . Urinary incontinence   . Venous stasis dermatitis  of both lower extremities    Past Surgical History:  Procedure Laterality Date  . ANKLE SURGERY Right   . APPLICATION OF WOUND VAC Left 06/27/2015   Procedure: APPLICATION OF WOUND VAC ( POSSIBLE ) ;  Surgeon: [name], MD;  Location: [facility] ORS;  Service: Vascular;  Laterality: Left;  . APPLICATION OF WOUND VAC Left 09/25/2016   Procedure: APPLICATION OF WOUND VAC;  Surgeon: [name], DPM;  Location: [facility] ORS;  Service: Podiatry;  Laterality: Left;  . arm surgery     right    . CHOLECYSTECTOMY    . COLONOSCOPY    . COLONOSCOPY WITH PROPOFOL N/A 01/11/2017   Procedure: COLONOSCOPY WITH PROPOFOL;  Surgeon: [name], MD;  Location: [facility] ENDOSCOPY;  Service: Endoscopy;  Laterality: N/A;  . COLONOSCOPY  WITH PROPOFOL N/A 02/22/2017   Procedure: COLONOSCOPY WITH PROPOFOL;  Surgeon: [name], MD;  Location: [facility] ENDOSCOPY;  Service: Endoscopy;  Laterality: N/A;  . ESOPHAGOGASTRODUODENOSCOPY (EGD) WITH PROPOFOL N/A 01/11/2017   Procedure: ESOPHAGOGASTRODUODENOSCOPY (EGD) WITH PROPOFOL;  Surgeon: [name], MD;  Location: [facility] ENDOSCOPY;  Service: Endoscopy;  Laterality: N/A;  . HERNIA REPAIR     umbilical  . HIATAL HERNIA REPAIR    . I&D EXTREMITY Left 06/27/2015   Procedure: IRRIGATION AND DEBRIDEMENT EXTREMITY            ( CALF HEMATOMA ) POSSIBLE WOUND VAC;  Surgeon: [name], MD;  Location: [facility] ORS;  Service: Vascular;  Laterality: Left;  . INCISION AND DRAINAGE OF WOUND Left 09/25/2016   Procedure: IRRIGATION AND DEBRIDEMENT - PARTIAL RESECTION OF ACHILLES TENDON WITH WOUND VAC APPLICATION;  Surgeon: [name], DPM;  Location: [facility] ORS;  Service: Podiatry;  Laterality: Left;  . IRRIGATION AND DEBRIDEMENT ABSCESS Left 09/07/2016   Procedure: IRRIGATION AND DEBRIDEMENT ABSCESS LEFT HEEL;  Surgeon: [name], DPM;  Location: [facility] ORS;  Service: Podiatry;  Laterality: Left;  . JOINT REPLACEMENT     left knee replacement  . KIDNEY SURGERY Right    kidney stones  . LITHOTRIPSY    . NM [facility] HX)  February 2017   Likely breast attenuation. LOW RISK study. Normal EF 55-60%.  . removal of left hematoma Left    leg  . REVERSE SHOULDER ARTHROPLASTY Right 10/08/2015   Procedure: REVERSE SHOULDER ARTHROPLASTY;  Surgeon: [name], MD;  Location: [facility] ORS;  Service: Orthopedics;  Laterality: Right;  . SLEEVE GASTROPLASTY    . TONSILLECTOMY    . TOTAL KNEE ARTHROPLASTY    . TRANSESOPHAGEAL ECHOCARDIOGRAM  08/08/2013   Mild LVH, EF 60-65%. Moderate LA dilation and mild RA dilation. Mild MR with no evidence of stenosis and no evidence of endocarditis. A false chordae is noted.  . TRANSTHORACIC ECHOCARDIOGRAM  08/03/2013   Mild-Moderate concentric LVH, EF  60-65%. Normal diastolic function. Mild LA dilation. Mild MR with possible vegetation  - not confirmed on TEE   . ULNAR NERVE TRANSPOSITION Right 08/06/2016   Procedure: ULNAR NERVE DECOMPRESSION/TRANSPOSITION;  Surgeon: [name], MD;  Location: [facility] ORS;  Service: Orthopedics;  Laterality: Right;  . UPPER GI ENDOSCOPY    . VAGINAL HYSTERECTOMY     Family History  Problem Relation Age of Onset  . Skin cancer Father   . Diabetes Father   . Hypertension Father   . Peripheral vascular disease Father   . Cancer Father   . Cerebral aneurysm Father   . Alcohol abuse Father   . Varicose Veins  Mother   . Kidney disease Mother   . Arthritis Mother   . Mental illness Sister   . Cancer Maternal Aunt        breast  . Breast cancer Maternal Aunt   . Arthritis Maternal Grandmother   . Hypertension Maternal Grandmother   . Diabetes Maternal Grandmother   . Arthritis Maternal Grandfather   . Heart disease Maternal Grandfather   . Hypertension Maternal Grandfather   . Arthritis Paternal Grandmother   . Hypertension Paternal Grandmother   . Diabetes Paternal Grandmother   . Arthritis Paternal Grandfather   . Hypertension Paternal Grandfather   . Bladder Cancer Neg Hx   . Kidney cancer Neg Hx    Social History   Socioeconomic History  . Marital status: Married    Spouse name: Not on file  . Number of children: Not on file  . Years of education: Not on file  . Highest education level: Not on file  Social Needs  . Financial resource strain: Not on file  . Food insecurity - worry: Not on file  . Food insecurity - inability: Not on file  . Transportation needs - medical: Not on file  . Transportation needs - non-medical: Not on file  Occupational History  . Not on file  Tobacco Use  . Smoking status: Never Smoker  . Smokeless tobacco: Never Used  Substance and Sexual Activity  . Alcohol use: No    Alcohol/week: 0.0 oz  . Drug use: No  . Sexual activity: No  Other Topics  Concern  . Not on file  Social History Narrative   She is currently married -- for 28 years. Does not work. Does not smoke or take alcohol. She never smoked. She exercises at least 3 days a week since before her gastric surgery.   Marital status reviewed in history of present illness.   Current Meds  Medication Sig  . albuterol (PROVENTIL HFA;VENTOLIN HFA) 108 (90 Base) MCG/ACT inhaler Inhale 1-2 puffs into the lungs every 4 (four) hours as needed for wheezing or shortness of breath. (Patient taking differently: Inhale 2 puffs into the lungs every 4 (four) hours as needed for wheezing or shortness of breath. )  . albuterol (PROVENTIL) (2.5 MG/3ML) 0.083% nebulizer solution Take 3 mLs (2.5 mg total) by nebulization every 6 (six) hours as needed for wheezing or shortness of breath.  . ALPRAZolam (XANAX) 0.5 MG tablet TAKE 1 TABLET BY MOUTH TWICE DAILY AS NEEDED  . buPROPion (WELLBUTRIN XL) 300 MG 24 hr tablet TAKE ONE TABLET BY MOUTH DAILY  . calcium carbonate (OSCAL) 1500 (600 Ca) MG TABS tablet Take 600 mg of elemental calcium by mouth 2 (two) times daily with a meal.  . cyanocobalamin 500 MCG tablet Take 500 mcg by mouth daily.  . dabigatran (PRADAXA) 150 MG CAPS capsule TAKE 1 CAPSULE BY MOUTH TWICE DAILY  . dexlansoprazole (DEXILANT) 60 MG capsule Take 1 capsule (60 mg total) by mouth daily. MUST KEEP NEXT APPT FOR 90 DAY SUPPLY  . DULoxetine (CYMBALTA) 60 MG capsule Take 1 capsule (60 mg total) by mouth daily.  Marland Kitchen estradiol (ESTRACE) 0.1 MG/GM vaginal cream Place 1 Applicatorful vaginally 3 (three) times a week.  . gabapentin (NEURONTIN) 800 MG tablet Take 1 tablet (800 mg total) by mouth 4 (four) times daily.  Marland Kitchen levocetirizine (XYZAL) 5 MG tablet Take 1 tablet (5 mg total) by mouth every evening.  Marland Kitchen levothyroxine (SYNTHROID, LEVOTHROID) 150 MCG tablet TAKE 1 TABLET EVERY DAY ON EMPTY  STOMACHWITH A GLASS OF WATER AT LEAST 30-60 MINBEFORE BREAKFAST  . meloxicam (MOBIC) 15 MG tablet Take 15 mg  by mouth daily.   . metFORMIN (GLUCOPHAGE) 1000 MG tablet TAKE ONE TABLET BY MOUTH TWICE DAILY (Patient taking differently: TAKE ONE TABLET IN THE MORNING AND HALF TABLET AT NIGHT)  . metoprolol tartrate (LOPRESSOR) 25 MG tablet TAKE 1 TABLET BY MOUTH TWICE DAILY  . mirabegron ER (MYRBETRIQ) 25 MG TB24 tablet Take 1 tablet (25 mg total) by mouth daily.  . montelukast (SINGULAIR) 10 MG tablet TAKE ONE TABLET BY MOUTH EVERY DAY (Patient taking differently: TAKE ONE TABLET BY MOUTH EVERY evening)  . Multiple Vitamin (MULTIVITAMIN WITH MINERALS) TABS tablet Take 1 tablet by mouth daily.  . ondansetron (ZOFRAN) 4 MG tablet Take 1 tablet (4 mg total) every 8 (eight) hours as needed by mouth for nausea or vomiting.  Marland Kitchen oxybutynin (DITROPAN XL) 15 MG 24 hr tablet Take 1 tablet (15 mg total) by mouth at bedtime.  Marland Kitchen oxyCODONE-acetaminophen (PERCOCET/ROXICET) 5-325 MG tablet Take 1 tablet by mouth every 6 (six) hours as needed for severe pain.  Marland Kitchen quinapril (ACCUPRIL) 40 MG tablet Take 1 tablet (40 mg total) by mouth daily. MUST KEEP APPT THIS TIME FOR ADDITIONAL REFILLS  . sulfamethoxazole-trimethoprim (BACTRIM,SEPTRA) 400-80 MG tablet Take 1 tablet by mouth 2 (two) times daily.  Marland Kitchen triamcinolone cream (KENALOG) 0.1 % Apply 1 application 2 (two) times daily as needed topically.  . [DISCONTINUED] quinapril (ACCUPRIL) 20 MG tablet Take 1 tablet (20 mg total) by mouth daily. MUST KEEP APPT THIS TIME FOR ADDITIONAL REFILLS   Allergies  Allergen Reactions  . Penicillins Hives, Shortness Of Breath and Swelling    Facial swelling Has patient had a PCN reaction causing immediate rash, facial/tongue/throat swelling, SOB or lightheadedness with hypotension: Yes Has patient had a PCN reaction causing severe rash involving mucus membranes or skin necrosis: Yes Has patient had a PCN reaction that required hospitalization No Has patient had a PCN reaction occurring within the last 10 years: No If all of the above answers  are "NO", then may proceed with Cephalosporin use.   . Aspirin Hives  . Morphine And Related Other (See Comments)    Patient becomes very confused  . Terramycin [Oxytetracycline] Hives   Recent Results (from the past 2160 hour(s))  Glucose, capillary     Status: Abnormal   Collection Time: 02/22/17  3:37 PM  Result Value Ref Range   Glucose-Capillary 111 (H) 65 - 99 mg/dL   Objective  Body mass index is 44.31 kg/m. Wt Readings from Last 3 Encounters:  04/20/17 262 lb 3.2 oz (118.9 kg)  04/19/17 262 lb (118.8 kg)  02/22/17 258 lb (117 kg)   Temp Readings from Last 3 Encounters:  04/20/17 98 F (36.7 C) (Oral)  04/19/17 97.6 F (36.4 C) (Oral)  02/22/17 97.9 F (36.6 C) (Tympanic)   BP Readings from Last 3 Encounters:  04/20/17 (!) 158/80  04/19/17 (!) 160/73  02/22/17 (!) 153/82   Pulse Readings from Last 3 Encounters:  04/20/17 85  04/19/17 80  02/22/17 66   o2 sat room air 92%   Physical Exam  Constitutional: She is oriented to person, place, and time and well-developed, well-nourished, and in no distress.  HENT:  Head: Normocephalic and atraumatic.  Mouth/Throat: Oropharynx is clear and moist and mucous membranes are normal.  Eyes: Conjunctivae are normal. Pupils are equal, round, and reactive to light.  Cardiovascular: Normal rate. An irregularly irregular rhythm present.  In AF  Pulmonary/Chest: Effort normal and breath sounds normal. She has no wheezes.  Musculoskeletal:       Left hip: She exhibits tenderness and bony tenderness.  Significant hip ttp pt gaits effected   Neurological: She is alert and oriented to person, place, and time.  Gait slowed walking with cane   Skin: Skin is warm, dry and intact.  Psychiatric: Mood, memory, affect and judgment normal.  Nursing note and vitals reviewed.   Assessment   1. Left hip pain with CT 03/2017 c/w intramuscular hematoma in distal psoas muscle vs muscular avulsion/contusion/tear possibly present  2.  Falls  3. HTN 4. Asthma  5. Hm  Plan  1.  Referred back to Dr. Roland Rack needs to see sooner than 6 weeks  Prn percocet call pain clinic and let them know   2. PT tomorrow, will also Rx OT at [facility] 3. Increase quinapril 20 to 40 mg qd, cont lopressor 25 bid f/u in 3 weeks  4. F/u Dr. Raul Del not using Advair due to yeast infections in mouth  Consider spiriva in future  For productive cough trial of Mucinex/DM or Robitussin DM no sx's fever, wheezing  Prn Albuterol  5.  Had flu, pna 23, prevnar  Consider Tdap and shingrix in future   DEXA 10/17/14 normal  Ask about pap at f/u  mammo neg 07/06/16 at f/u order another  Colonoscopy h/o polyps 02/22/17 prep not good needs to f/u and resch with [facility]   Need to check labs at f/u CMET, CBC, A1C, lipid, UA, urine protein, Vit D, B12, TSH, consider check Hep B/C Provider: Dr. Olivia Mackie [name]-[facility]

## 2017-04-22 NOTE — Telephone Encounter (Signed)
Attemtped to contact patient, message left.

## 2017-04-23 ENCOUNTER — Other Ambulatory Visit: Payer: Self-pay | Admitting: Family Medicine

## 2017-04-23 NOTE — Telephone Encounter (Signed)
Last office visit 04/20/17 Nov 05/13/17 Previously RX'd Dr Lacinda Axon

## 2017-04-25 IMAGING — MR MR SHOULDER*R* W/O CM
5 series · 40 of 40 positions shown · non-contrast
Comparison: None.

CLINICAL DATA: Chronic right shoulder pain and limited range of
motion.

EXAM:
MRI OF THE RIGHT SHOULDER WITHOUT CONTRAST
TECHNIQUE: Multiplanar, multisequence MR imaging of the shoulder was performed.
No intravenous contrast was administered.

[Series 6: T1 · oblique · 4.0mm · 0.55mm/px · 7 of 20 slices shown]
[im 1/20]
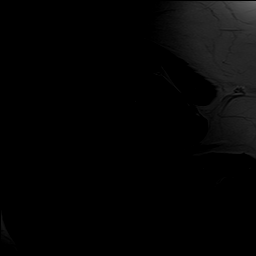
[im 4/20]
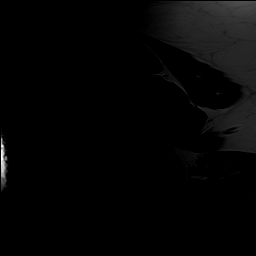
[im 7/20]
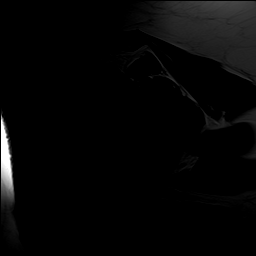
[im 10/20]
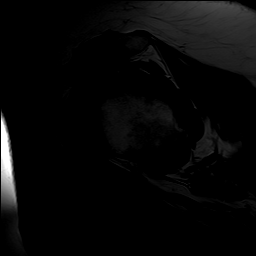
[im 13/20]
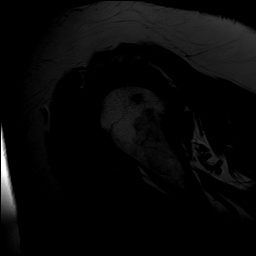
[im 16/20]
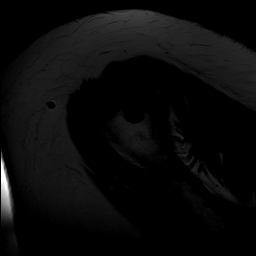
[im 20/20]
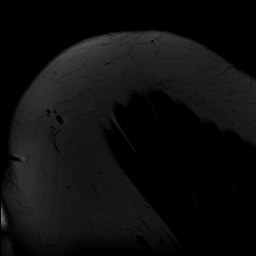

[Series 8: T2 fat-sat · oblique · 4.0mm · 0.62mm/px · 8 of 20 slices shown (1 of 2)]
[im 1/20]
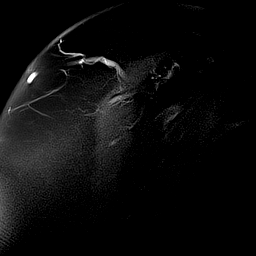
[im 3/20]
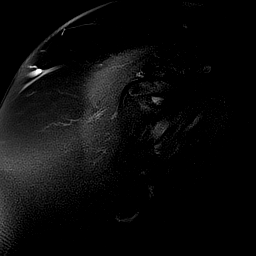
[im 6/20]
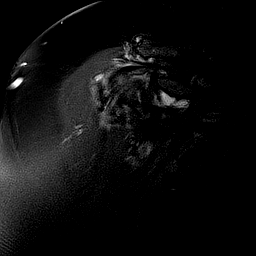
[im 9/20]
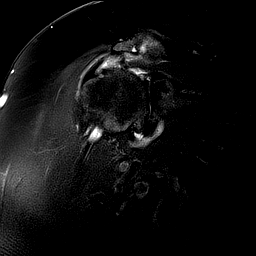
[im 11/20]
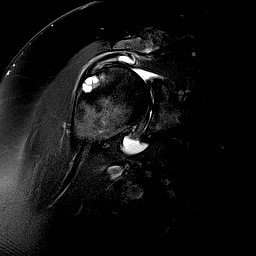
[im 14/20]
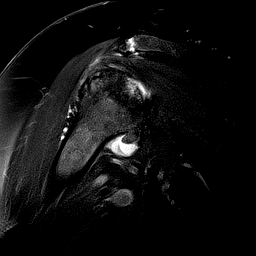
[im 17/20]
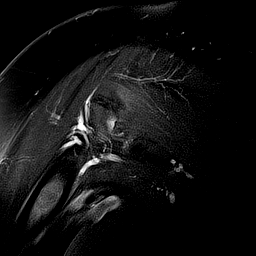
[im 20/20]
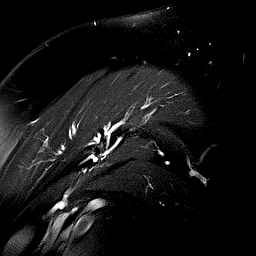

[Series 100: T2 · axial · 4.0mm · 0.55mm/px · z∈[-22,+72]mm · 9 of 24 slices shown]
[im 1/24]
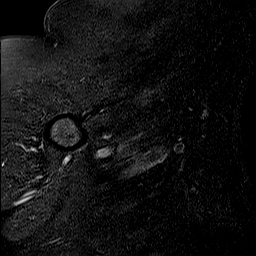
[im 3/24]
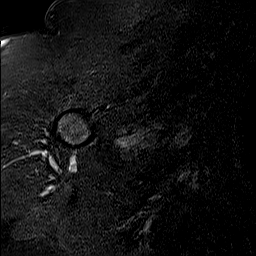
[im 6/24]
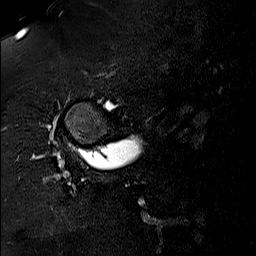
[im 9/24]
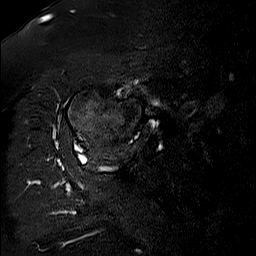
[im 12/24]
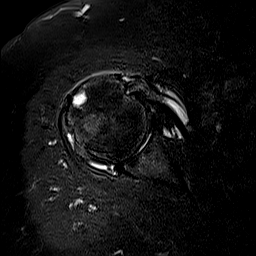
[im 15/24]
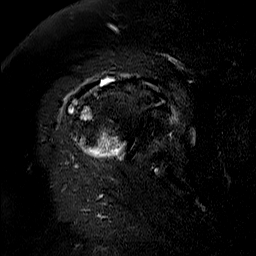
[im 18/24]
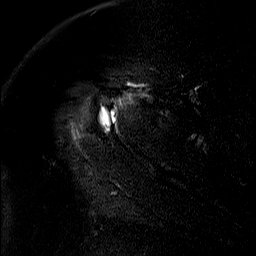
[im 21/24]
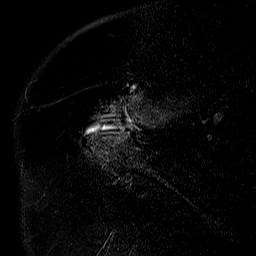
[im 24/24]
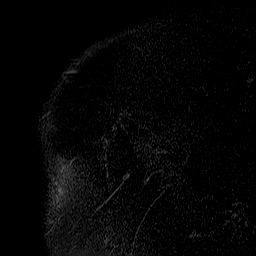

[Series 101: coronal obl pd_fil_1 · oblique · 4.0mm · 0.55mm/px · 8 of 20 slices shown]
[im 1/20]
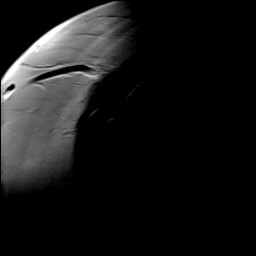
[im 3/20]
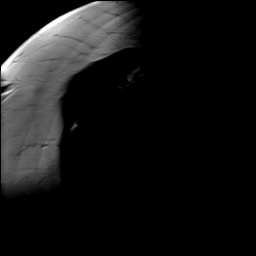
[im 6/20]
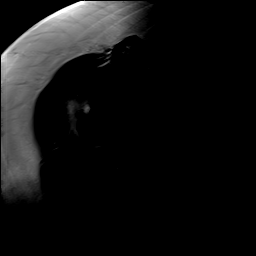
[im 9/20]
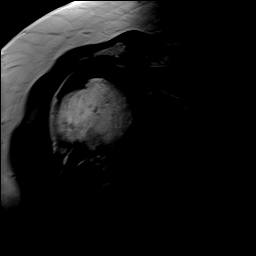
[im 11/20]
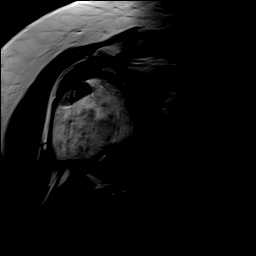
[im 14/20]
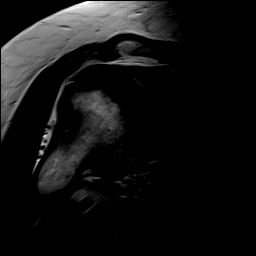
[im 17/20]
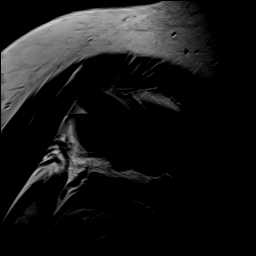
[im 20/20]
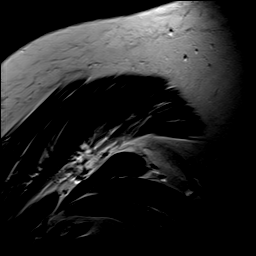

[Series 102: T2 fat-sat · oblique · 4.0mm · 0.55mm/px · 8 of 20 slices shown (2 of 2)]
[im 1/20]
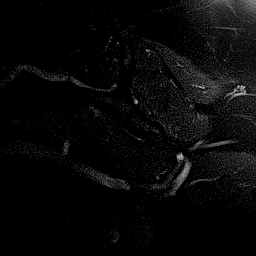
[im 3/20]
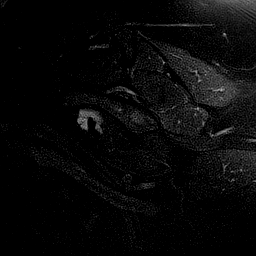
[im 6/20]
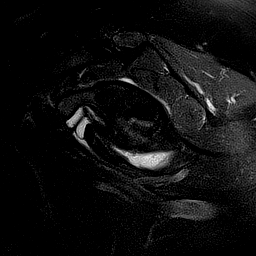
[im 9/20]
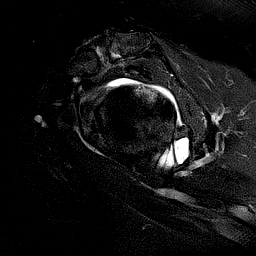
[im 11/20]
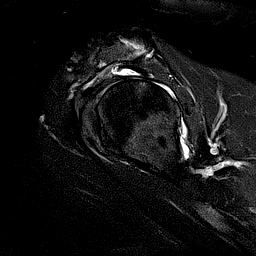
[im 14/20]
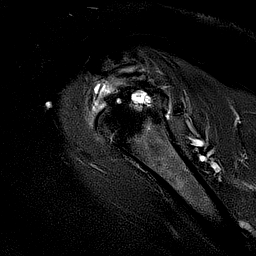
[im 17/20]
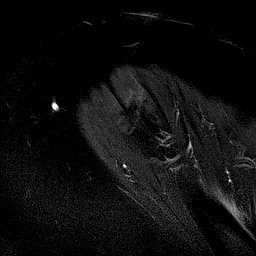
[im 20/20]
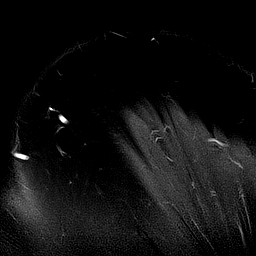

[40 of 40 positions shown; findings below may reference images not displayed]

FINDINGS: Rotator cuff: Severe rotator cuff tendinopathy/ tendinosis. There is
an extensive partial thickness articular surface tear involving the
infraspinatus and supraspinatus tendons with maximum retraction of
the articular fibers estimated at 3 cm. Intrasubstance ganglion cyst
is noted. The bursal surface is still intact but is quite thin and
anteriorly in the supraspinatus tendon and patient at high risk for
a full-thickness tear.

The sub scapularis tendon is intact.

Muscles:  Grossly normal.

Biceps long head: Intact. Moderate tendinopathy involving the
intra-articular portion.

Acromioclavicular Joint: Moderate AC joint degenerative changes.
Type 2 acromion with os acromial. No significant lateral downsloping
or undersurface spurring.

Glenohumeral Joint: Advanced degenerative changes with joint space
narrowing, degenerative chondrosis and significant spurring.

Labrum: Degenerated and likely torn superior labrum. The anterior
and posterior labor are grossly intact. Moderate degenerative
changes without obvious tear.

Bones: No acute bony findings. Subchondral cystic change noted in
the humeral head.

Other:  Mild to moderate subacromial/ subdeltoid bursitis.
IMPRESSION: 1. Severe rotator cuff tendinopathy/tendinosis. There is an
extensive partial thickness articular surface tear involving the
infraspinatus and supraspinatus tendons. Patient probably at high
risk for full-thickness tear, particularly near the anterior
attachment region of the supraspinatus tendon.
2. Moderate tendinopathy involving the intra-articular portion of
the long head biceps tendon.
3. Degenerated and likely torn superior labrum.
4. Advanced glenohumeral joint degenerative changes.
5. Moderate AC joint degenerative changes and type 2 acromion.
6. Mild to moderate subacromial/subdeltoid bursitis.

## 2017-04-26 ENCOUNTER — Encounter: Payer: Self-pay | Admitting: Physical Therapy

## 2017-04-26 ENCOUNTER — Other Ambulatory Visit: Payer: Self-pay | Admitting: Internal Medicine

## 2017-04-26 ENCOUNTER — Other Ambulatory Visit: Payer: Self-pay | Admitting: Family Medicine

## 2017-04-26 ENCOUNTER — Telehealth: Payer: Self-pay

## 2017-04-26 ENCOUNTER — Other Ambulatory Visit: Payer: Self-pay

## 2017-04-26 ENCOUNTER — Ambulatory Visit: Payer: PPO | Attending: Surgery | Admitting: Physical Therapy

## 2017-04-26 DIAGNOSIS — M6281 Muscle weakness (generalized): Secondary | ICD-10-CM | POA: Diagnosis not present

## 2017-04-26 DIAGNOSIS — M25552 Pain in left hip: Secondary | ICD-10-CM

## 2017-04-26 DIAGNOSIS — J4540 Moderate persistent asthma, uncomplicated: Secondary | ICD-10-CM

## 2017-04-26 DIAGNOSIS — R262 Difficulty in walking, not elsewhere classified: Secondary | ICD-10-CM | POA: Insufficient documentation

## 2017-04-26 NOTE — Telephone Encounter (Signed)
Copied from [place] [id number]. Topic: Inquiry >> Apr 26, 2017  1:00 PM [name], Dawn [place], [place] wrote: Reason for CRM: Patient calling because she would like to let Dr. Aundra Dubin know that she would like to have the walker with the seat, that they were talking about at her last visit. If she or her nurse could give her a call back about this at [phone]

## 2017-04-26 NOTE — Telephone Encounter (Signed)
Copied from [place] [id number]. Topic: Quick Communication - Rx Refill/Question >> Apr 26, 2017  1:06 PM Corie Chiquito, [place] wrote: Medication: Oxycodone   Has the patient contacted their pharmacy?No  Patient calling because she needs a refill on her Oxycodone. Did advise patient to contact her pharmacy as well for medication  Preferred Pharmacy (with phone number or street name): [facility] [address] [phone]   Agent: Please be advised that RX refills may take up to 3 business days. We ask that you follow-up with your pharmacy.

## 2017-04-26 NOTE — Therapy (Signed)
[place] MAIN [facility] SERVICES [address] [place], [place], [address] Phone: [phone]   Fax:  [phone]  Physical Therapy Evaluation  Patient Details  Name: [name] MRN: [id number] Date of Birth: 11-30-1950 Referring Provider: Dr. Roland Rack   Encounter Date: 04/26/2017  PT End of Session - 04/28/17 1259    Visit Number  1    Number of Visits  17    Date for PT Re-Evaluation  06/26/17    PT Start Time  1106    PT Stop Time  1206    PT Time Calculation (min)  60 min    Equipment Utilized During Treatment  Gait belt    Activity Tolerance  Patient limited by pain;Patient limited by lethargy    Behavior During Therapy  [facility] for tasks assessed/performed       Past Medical History:  Diagnosis Date  . Abnormal antibody titer   . Anxiety and depression   . Arthritis   . Asthma   . Cystocele   . Depression   . Diabetes ([place])   . DVT (deep venous thrombosis) (HCC)    Righ calf  . Dysrhythmia   . Edema   . GERD (gastroesophageal reflux disease)   . Heart murmur    Echocardiogram June 2015: Mild MR (possible vegetation seen on TTE, not seen on TEE), normal LV size with moderate concentric LVH. Normal function EF 60-65%. Normal diastolic function. Mild LA dilation.  Marland Kitchen History of IBS   . History of methicillin resistant staphylococcus aureus (MRSA) 2008  . Hyperlipidemia   . Hypertension   . Hypothyroidism   . Insomnia   . Kidney stone    kidney stones with lithotripsy  . Neuropathy involving both lower extremities   . Obesity, Class II, BMI 35-39.9, with comorbidity   . Paroxysmal atrial fibrillation ([place]) 2007   In June 2015, Cardiac Event Monitor: Mostly SR/sinus arrhythmia with PVCs that are frequent. Short bursts of A. fib lasting several minutes;; CHA2DS2-VASc Score = 5 (age, Female, PVD, DM, HTN)  . Peripheral vascular disease ([place])   . Sleep apnea    use C-PAP  . Urinary incontinence   . Venous stasis dermatitis of both  lower extremities     Past Surgical History:  Procedure Laterality Date  . ANKLE SURGERY Right   . APPLICATION OF WOUND VAC Left 06/27/2015   Procedure: APPLICATION OF WOUND VAC ( POSSIBLE ) ;  Surgeon: [name], MD;  Location: [facility] ORS;  Service: Vascular;  Laterality: Left;  . APPLICATION OF WOUND VAC Left 09/25/2016   Procedure: APPLICATION OF WOUND VAC;  Surgeon: [name], DPM;  Location: [facility] ORS;  Service: Podiatry;  Laterality: Left;  . arm surgery     right    . CHOLECYSTECTOMY    . COLONOSCOPY    . COLONOSCOPY WITH PROPOFOL N/A 01/11/2017   Procedure: COLONOSCOPY WITH PROPOFOL;  Surgeon: [name], MD;  Location[facility] ENDOSCOPY;  Service: Endoscopy;  Laterality: N/A;  . COLONOSCOPY WITH PROPOFOL N/A 02/22/2017   Procedure: COLONOSCOPY WITH PROPOFOL;  Surgeon: [name], MD;  Location: [facility] ENDOSCOPY;  Service: Endoscopy;  Laterality: N/A;  . ESOPHAGOGASTRODUODENOSCOPY (EGD) WITH PROPOFOL N/A 01/11/2017   Procedure: ESOPHAGOGASTRODUODENOSCOPY (EGD) WITH PROPOFOL;  Surgeon: [name], MD;  Location: [facility] ENDOSCOPY;  Service: Endoscopy;  Laterality: N/A;  . HERNIA REPAIR     umbilical  . HIATAL HERNIA REPAIR    . I&D EXTREMITY Left 06/27/2015   Procedure:  IRRIGATION AND DEBRIDEMENT EXTREMITY            ( CALF HEMATOMA ) POSSIBLE WOUND VAC;  Surgeon: [name], MD;  Location: [facility] ORS;  Service: Vascular;  Laterality: Left;  . INCISION AND DRAINAGE OF WOUND Left 09/25/2016   Procedure: IRRIGATION AND DEBRIDEMENT - PARTIAL RESECTION OF ACHILLES TENDON WITH WOUND VAC APPLICATION;  Surgeon: [name], DPM;  Location: [facility] ORS;  Service: Podiatry;  Laterality: Left;  . IRRIGATION AND DEBRIDEMENT ABSCESS Left 09/07/2016   Procedure: IRRIGATION AND DEBRIDEMENT ABSCESS LEFT HEEL;  Surgeon: [name], DPM;  Location: [facility] ORS;  Service: Podiatry;  Laterality: Left;  . JOINT REPLACEMENT     left knee replacement  . KIDNEY SURGERY Right    kidney  stones  . LITHOTRIPSY    . NM [facility] HX)  February 2017   Likely breast attenuation. LOW RISK study. Normal EF 55-60%.  . OTHER SURGICAL HISTORY     08/2016 or 09/2016 surgery on achilles tenso h/o staph infection heal. Dr. [name]  . removal of left hematoma Left    leg  . REVERSE SHOULDER ARTHROPLASTY Right 10/08/2015   Procedure: REVERSE SHOULDER ARTHROPLASTY;  Surgeon: [name], MD;  Location: [facility] ORS;  Service: Orthopedics;  Laterality: Right;  . SLEEVE GASTROPLASTY    . TONSILLECTOMY    . TOTAL KNEE ARTHROPLASTY    . TRANSESOPHAGEAL ECHOCARDIOGRAM  08/08/2013   Mild LVH, EF 60-65%. Moderate LA dilation and mild RA dilation. Mild MR with no evidence of stenosis and no evidence of endocarditis. A false chordae is noted.  . TRANSTHORACIC ECHOCARDIOGRAM  08/03/2013   Mild-Moderate concentric LVH, EF 60-65%. Normal diastolic function. Mild LA dilation. Mild MR with possible vegetation  - not confirmed on TEE   . ULNAR NERVE TRANSPOSITION Right 08/06/2016   Procedure: ULNAR NERVE DECOMPRESSION/TRANSPOSITION;  Surgeon: [name], MD;  Location: [facility] ORS;  Service: Orthopedics;  Laterality: Right;  . UPPER GI ENDOSCOPY    . VAGINAL HYSTERECTOMY      There were no vitals filed for this visit.   Subjective Assessment - 04/28/17 1258    Subjective  67 yo female presents to physical therapy with spc, husband, in wheelchair with acute L hip pain and hx of falls; 30 minutes late.    Patient is accompained by:  Family member    Pertinent History  Patient presents with hx of DM, PVD, peripheral neuropathy. In June 2018 patient suffered staph infection and subsequent necrosis of L Achilles tendon requiring partial resection. Patient has suffered three falls in last six months, including one where she felt a "pop" in the hip while getting out of the bathtub. Went to [facility] two days later and received x-ray, negative; diagnosed with muscle strain and was recommended exercises. ER visit  on 1/28 following third fall onto L hip, diagnosed with hematoma. Patient reports reduced sensation in feet L > R. Requires assistance to get into and out of bed since tendon surgery. Patient uses spc for ambulation and reports that small steps do not cause pain.    Limitations  Walking;Sitting;Standing;Lifting;House hold activities    How long can you sit comfortably?  1 hr    How long can you stand comfortably?  n/a    How long can you walk comfortably?  40 min    Diagnostic tests  x-ray, negative, CT scan showed torn muscle    Patient Stated Goals  Improve balance, reduce fall risk, and can go somewhere  without FOF    Currently in Pain?  Yes    Pain Score  6     Pain Location  Hip    Pain Orientation  Left    Pain Descriptors / Indicators  Aching    Pain Type  Acute pain    Pain Onset  1 to 4 weeks ago    Pain Frequency  Constant    Aggravating Factors   moving leg    Pain Relieving Factors  rest    Effect of Pain on Daily Activities  dependent or modified I with adls    Multiple Pain Sites  Yes    Pain Score  2    Pain Location  Hip    Pain Orientation  Right    Pain Descriptors / Indicators  Aching    Pain Type  Acute pain    Pain Onset  1 to 4 weeks ago    Pain Frequency  Intermittent    Aggravating Factors   stepping up with RLE            PAIN 6/10 current Highly irritable, musculoskeletal nature   POSTURE No abnormalities noted  GAIT Reduced step length B, reduced step height B, reduced stance time on L. Uses spc, rocker shoes B.   MOBILITY Bed Mobility: Requires max A for transferring supine > sidelying L/R Requires mod A for transferring sidelying > sitting EOB RLE able to assist with bridging and scooting  Independent with sit > stand, requires UE support Independent with stand > sit, requires UE support  BALANCE Steady with standing on stable and unstable surfaces Steady with feet together Cannot perform tandem stance, SLS   PROM / AROM /  MMT  RLE grossly WNL except R ankle globally hypomobile L hip painful with active and passive flexion (with and without knee flexion), adduction, abduction. L ankle globally hypomobile.   PALPATION / PAM Greatly reduced thickness of achilles tendon.    NEURO Sensation: light touch impaired over feet L > R Reflexes:  nt Coordination: gross motor intact    OUTCOME MEASURES  TEST SCORE INTERPRETATION  10MW 0.21 m/s Severe risk for falls; household walker  5xSTS 52.5 sec More likely to have balance dysfunction  LEFS 9/80 Very low function       PT Education - 04/28/17 1259    Education provided  Yes    Education Details  plan of care, recommendations    Person(s) Educated  Patient;Spouse    Methods  Explanation    Comprehension  Verbalized understanding;Returned demonstration;Need further instruction       PT Short Term Goals - 04/26/17 1502      PT SHORT TERM GOAL #1   Title  Patient will be independent in home exercise program to improve strength/mobility for better functional independence with bed mobility and ADLs.    Baseline  No HEP    Time  4    Period  Weeks    Status  New    Target Date  05/24/17      PT SHORT TERM GOAL #2   Title  Patient will report a worst pain score of 3/10 VAS in the L hip to improve tolerance with ADLs and reduce symptoms with bed mobility activities.      Baseline  6/10 with medication    Time  4    Period  Weeks    Status  New    Target Date  05/24/17      PT SHORT  TERM GOAL #3   Title  Patient will demonstrate sit to supine bed mobility, supine to sit bed mobility, and rolling L and R with minimal assistance for improved functional mobility and independence    Baseline  Patient requires mod-max A for bed mobility    Time  4    Period  Weeks    Status  New    Target Date  05/24/17        PT Long Term Goals - 04/26/17 1504      PT LONG TERM GOAL #1   Title  Patient (>33 years old) will improve 10-meter walk test to 0.6  meters/sec with least restrictive AD to demonstrate reduced fall risk and capacity for limited community ambulation.    Baseline  0.21 meters/sec    Time  8    Period  Weeks    Status  New    Target Date  06/21/17      PT LONG TERM GOAL #2   Title  Patient will improve LEFS score to 40/80 to demonstrate improved functional mobility and independence with ADLs    Baseline  9/80    Time  8    Period  Weeks    Status  New    Target Date  06/21/17      PT LONG TERM GOAL #3   Title  Patient will demonstrate ability to perform bed mobility independently for improved functional mobility and independence.    Baseline  Patient requires mod-max A for bed mobility    Time  8    Period  Weeks    Status  New    Target Date  06/21/17      PT LONG TERM GOAL #4   Title  --    Baseline  --    Time  --    Period  --    Status  --    Target Date  --          Plan - 04/28/17 1300    Clinical Impression Statement  This 67 yo female presents with L hip pain following 3 recent falls. She reports constant pain at 6/10 and is tender to palpation on the L hip. She has decreased mobility including transfers from sit to stand requiring definite UE support and bed mobility requiring max assist for sit to supine, supine to sit, and mod assist for rolling to the R with reports of increased pain during mobility. Patient's ROM and strength is WFL RLE; not tested in LLE due to pain with all passive or attempted active RLE movements in supine. Patient unsteady with gait with spc, bilateral rocker shoes, decreased step height, decreased step length, and inability to ambulate intermediate or long distances. Demonstrates decreased static balance (G-/F+) and decreased dynamic standing balance (G-/F+). Patient has low 10MW test (0.21 meters/sec) and decreased 5x sit-to-stand (52.5 sec) indicating a high fall risk. Patient will benefit from skilled PT to improve strength, ROM, functional mobility and gait; to improve  falls risk and safety at home; and to return to community ambulation.    Rehab Potential  Fair    Clinical Impairments Affecting Rehab Potential  - comorbidities, age, severity / + family support, acute injury    PT Frequency  2x / week    PT Duration  8 weeks    PT Treatment/Interventions  Aquatic Therapy;ADLs/Self Care Home Management;Cryotherapy;Electrical Stimulation;Moist Heat;Ultrasound;DME Instruction;Gait training;Stair training;Functional mobility training;Therapeutic activities;Therapeutic exercise;Balance training;Neuromuscular re-education;Patient/family education;Manual techniques;Compression bandaging;Scar mobilization;Passive range of motion;Dry needling;Energy  conservation;Taping    PT Next Visit Plan  Assess balance; bed mobility patient education    Consulted and Agree with Plan of Care  Patient       Patient will benefit from skilled therapeutic intervention in order to improve the following deficits and impairments:  Abnormal gait, Decreased activity tolerance, Decreased balance, Decreased endurance, Decreased coordination, Decreased mobility, Decreased range of motion, Decreased safety awareness, Difficulty walking, Decreased strength, Hypomobility, Impaired perceived functional ability, Impaired UE functional use, Obesity, Impaired flexibility, Pain  Visit Diagnosis: Pain in left hip  Difficulty in walking, not elsewhere classified  Muscle weakness (generalized)     Problem List Patient Active Problem List   Diagnosis Date Noted  . Pain of left hip joint 04/22/2017  . Abnormality of gait and mobility 04/22/2017  . Fall with injury 02/02/2017  . Anemia 11/18/2016  . Asthma 11/18/2016  . Carpal tunnel syndrome 11/18/2016  . Pain in joint involving ankle and foot 11/18/2016  . Obstructive sleep apnea of adult 11/18/2016  . Spinal stenosis of thoracic region 11/18/2016  . Abscess of tendon of left foot 09/21/2016  . Cubital tunnel syndrome on right 08/07/2016  .  Chronic pain syndrome 02/25/2016  . Long term current use of opiate analgesic 02/25/2016  . Chronic right shoulder pain 02/25/2016  . Status post reverse total shoulder replacement 10/08/2015  . 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 Morbid obesity with BMI of 40.0-44.9, adult ([place]) 09/23/2015  . Chronic prescription benzodiazepine use 09/23/2015  . Osteoarthritis, multiple sites 09/23/2015  . Other shoulder lesions (Right) 08/28/2015  . Cervical radiculitis 08/06/2015  . Opiate use (15 MME/Day) 01/16/2015  . Impingement syndrome of shoulder region 01/07/2015  . Mixed incontinence 11/26/2014  . Atrophic vaginitis 11/26/2014  . Anxiety and depression 09/21/2014  . Bladder cystocele 09/21/2014  . Gastro-esophageal reflux disease without esophagitis 09/21/2014  . DM (diabetes mellitus) type II, controlled, with peripheral vascular disorder ([place]) 08/31/2014  . Diabetic peripheral neuropathy associated with type 2 diabetes mellitus ([place]) 08/31/2014  . Asthma, moderate persistent, well-controlled 08/31/2014  . Hypothyroidism, adult 08/31/2014  . Peripheral vascular disease due to secondary diabetes mellitus ([place]) 12/11/2013  . Apnea, sleep 10/19/2013  . Paroxysmal atrial fibrillation ([place]); CHA2DS2-Vasc Score 5. On Pradaxa 07/27/2013  . Essential hypertension 07/27/2013   Virgia Land, SPT Alanson Puls, PT, DPT 04/28/2017, 1:01 PM  [place] MAIN [facility] SERVICES [address] [place], [place], [address] Phone: [phone]   Fax:  [phone]  Name: [name] MRN: [id number] Date of Birth: 1951/01/12

## 2017-04-26 NOTE — Telephone Encounter (Signed)
I can no longer refill this for her must see ortho [place] ortho Dr. Roland Rack  -when is ortho appt ? Please call needs to sch asap   and she is already scheduled and established with the pain clinic   Thanks [place]

## 2017-04-26 NOTE — Telephone Encounter (Signed)
Please advise 

## 2017-04-26 NOTE — Telephone Encounter (Signed)
Oxycodone refill Last OV: 04/20/17 with Dr. [name] Last Refill:04/20/17  #20 Pharmacy: [facility]

## 2017-04-27 ENCOUNTER — Telehealth: Payer: Self-pay | Admitting: *Deleted

## 2017-04-27 ENCOUNTER — Other Ambulatory Visit: Payer: Self-pay | Admitting: Internal Medicine

## 2017-04-27 DIAGNOSIS — L97511 Non-pressure chronic ulcer of other part of right foot limited to breakdown of skin: Secondary | ICD-10-CM | POA: Diagnosis not present

## 2017-04-27 DIAGNOSIS — E1143 Type 2 diabetes mellitus with diabetic autonomic (poly)neuropathy: Secondary | ICD-10-CM | POA: Diagnosis not present

## 2017-04-27 DIAGNOSIS — R269 Unspecified abnormalities of gait and mobility: Secondary | ICD-10-CM

## 2017-04-27 NOTE — Telephone Encounter (Signed)
Patient called to let us know that she had a fall at the beauty shop where she slipped on a slick floor and as a result has a torn muscle in her left leg that is being treated by Karlyn Agee and Dr Donnelly Stager.  She is currently receiving additional pain medicine and going to PT for recovery.  Oxycodone - apap 5-325 mg given for acute pain.

## 2017-04-27 NOTE — Telephone Encounter (Signed)
Pt. Given message from Dr. [name]. States she will contact Dr. Roland Rack for refill of pain medicine.

## 2017-04-27 NOTE — Telephone Encounter (Signed)
Called patient, left message for patient to return call back. She needs to be informed of below.  PEC may give and receive the information Dr Olivia Mackie requests.

## 2017-04-28 ENCOUNTER — Telehealth: Payer: Self-pay

## 2017-04-28 ENCOUNTER — Encounter: Payer: Self-pay | Admitting: Physical Therapy

## 2017-04-28 ENCOUNTER — Ambulatory Visit: Payer: PPO | Admitting: Physical Therapy

## 2017-04-28 DIAGNOSIS — M6281 Muscle weakness (generalized): Secondary | ICD-10-CM

## 2017-04-28 DIAGNOSIS — M25552 Pain in left hip: Secondary | ICD-10-CM | POA: Diagnosis not present

## 2017-04-28 DIAGNOSIS — R262 Difficulty in walking, not elsewhere classified: Secondary | ICD-10-CM

## 2017-04-28 NOTE — Telephone Encounter (Deleted)
Copied from [place] [phone]. Topic: Inquiry >> Apr 28, 2017 11:37 AM [name] wrote: Patient spouse checking status. Please advise

## 2017-04-28 NOTE — Addendum Note (Signed)
Addended by: Alanson Puls on: 04/28/2017 03:12 PM   Modules accepted: Orders

## 2017-04-28 NOTE — Patient Instructions (Signed)
[name],  Try these exercises with 50% force to start. When you can complete these exercises without pain, try 75% force. When you can do that pain-free, 100% force.   Abduction: Isometric - Bilateral (Sitting)    Position Patient: Keep legs slightly apart, feet flat. Helper: Place hands on outside of both knees. Motion - Cue patient to press legs apart. - Helper blocks movement. Hold _2_ seconds. Relax. Repeat _10_ times. Do _1_ sessions per day.  Copyright  VHI. All rights reserved.  Flexion: Isometric (Sitting)    Position Patient: Bend left knee to 90. Helper: Stabilize on top of thigh. Place other hand behind ankle. Motion -Cue patient to press ankle backward. -Helper blocks movement. Hold _2_ seconds. Relax. Repeat _10_ times. Repeat with other leg. Do _1_ sessions per day. Variation: Perform with knee bent to ___.  Copyright  VHI. All rights reserved.  Flexion: Isometric (Sitting)    Position Helper: Place hands on left thigh. Motion - Cue patient to lift leg. - Helper blocks movement. Hold _2_ seconds. Relax. Repeat _10_ times. Repeat with other leg. Do _1_ sessions per day.     Copyright  VHI. All rights reserved.

## 2017-04-28 NOTE — Telephone Encounter (Deleted)
Copied from [place] [phone]. Topic: Inquiry >> Apr 28, 2017 11:37 AM Synthia Innocent wrote: Patient spouse checking status. Please advise

## 2017-04-28 NOTE — Telephone Encounter (Signed)
Copied from [place] [phone]. Topic: Inquiry >> Apr 26, 2017  1:00 PM [place], Claiborne [place], [place] wrote: Reason for CRM: Patient calling because she would like to let Dr. Aundra Dubin know that she would like to have the walker with the seat, that they were talking about at her last visit. If she or her nurse could give her a call back about this at [phone]  >> Apr 28, 2017 11:37 AM Synthia Innocent wrote: Patient spouse checking status. Please advise

## 2017-04-28 NOTE — Therapy (Signed)
[place] MAIN [facility] SERVICES [address] [place], [place], [address] Phone: [phone]   Fax:  [phone]  Physical Therapy Treatment  Patient Details  Name: [name] MRN: [id number] Date of Birth: 01-13-1951 Referring Provider: Dr. Roland Rack   Encounter Date: 04/28/2017  PT End of Session - 04/28/17 1259    Visit Number  2   Number of Visits  17    Date for PT Re-Evaluation  06/26/17    PT Start Time  1100   PT Stop Time  1150    PT Time Calculation (min)  50   Equipment Utilized During Treatment  Gait belt    Activity Tolerance  Patient limited by pain;Patient limited by lethargy    Behavior During Therapy  [facility] for tasks assessed/performed       Past Medical History:  Diagnosis Date  . Abnormal antibody titer   . Anxiety and depression   . Arthritis   . Asthma   . Cystocele   . Depression   . Diabetes ([name])   . DVT (deep venous thrombosis) (HCC)    Righ calf  . Dysrhythmia   . Edema   . GERD (gastroesophageal reflux disease)   . Heart murmur    Echocardiogram June 2015: Mild MR (possible vegetation seen on TTE, not seen on TEE), normal LV size with moderate concentric LVH. Normal function EF 60-65%. Normal diastolic function. Mild LA dilation.  Marland Kitchen History of IBS   . History of methicillin resistant staphylococcus aureus (MRSA) 2008  . Hyperlipidemia   . Hypertension   . Hypothyroidism   . Insomnia   . Kidney stone    kidney stones with lithotripsy  . Neuropathy involving both lower extremities   . Obesity, Class II, BMI 35-39.9, with comorbidity   . Paroxysmal atrial fibrillation ([place]) 2007   In June 2015, Cardiac Event Monitor: Mostly SR/sinus arrhythmia with PVCs that are frequent. Short bursts of A. fib lasting several minutes;; CHA2DS2-VASc Score = 5 (age, Female, PVD, DM, HTN)  . Peripheral vascular disease ([place])   . Sleep apnea    use C-PAP  . Urinary incontinence   . Venous stasis dermatitis of both lower  extremities     Past Surgical History:  Procedure Laterality Date  . ANKLE SURGERY Right   . APPLICATION OF WOUND VAC Left 06/27/2015   Procedure: APPLICATION OF WOUND VAC ( POSSIBLE ) ;  Surgeon: [name], MD;  Location: [facility] ORS;  Service: Vascular;  Laterality: Left;  . APPLICATION OF WOUND VAC Left 09/25/2016   Procedure: APPLICATION OF WOUND VAC;  Surgeon: [name], DPM;  Location: [facility] ORS;  Service: Podiatry;  Laterality: Left;  . arm surgery     right    . CHOLECYSTECTOMY    . COLONOSCOPY    . COLONOSCOPY WITH PROPOFOL N/A 01/11/2017   Procedure: COLONOSCOPY WITH PROPOFOL;  Surgeon: [name], MD;  Location[facility] ENDOSCOPY;  Service: Endoscopy;  Laterality: N/A;  . COLONOSCOPY WITH PROPOFOL N/A 02/22/2017   Procedure: COLONOSCOPY WITH PROPOFOL;  Surgeon: [name], MD;  Location: [facility] ENDOSCOPY;  Service: Endoscopy;  Laterality: N/A;  . ESOPHAGOGASTRODUODENOSCOPY (EGD) WITH PROPOFOL N/A 01/11/2017   Procedure: ESOPHAGOGASTRODUODENOSCOPY (EGD) WITH PROPOFOL;  Surgeon: [name], MD;  Location: [facility] ENDOSCOPY;  Service: Endoscopy;  Laterality: N/A;  . HERNIA REPAIR     umbilical  . HIATAL HERNIA REPAIR    . I&D EXTREMITY Left 06/27/2015   Procedure: IRRIGATION AND DEBRIDEMENT EXTREMITY            (  CALF HEMATOMA ) POSSIBLE WOUND VAC;  Surgeon: [name], MD;  Location: [facility] ORS;  Service: Vascular;  Laterality: Left;  . INCISION AND DRAINAGE OF WOUND Left 09/25/2016   Procedure: IRRIGATION AND DEBRIDEMENT - PARTIAL RESECTION OF ACHILLES TENDON WITH WOUND VAC APPLICATION;  Surgeon: [name], DPM;  Location: [facility] ORS;  Service: Podiatry;  Laterality: Left;  . IRRIGATION AND DEBRIDEMENT ABSCESS Left 09/07/2016   Procedure: IRRIGATION AND DEBRIDEMENT ABSCESS LEFT HEEL;  Surgeon: [name], DPM;  Location: [facility] ORS;  Service: Podiatry;  Laterality: Left;  . JOINT REPLACEMENT     left knee replacement  . KIDNEY SURGERY Right    kidney  stones  . LITHOTRIPSY    . NM [facility] HX)  February 2017   Likely breast attenuation. LOW RISK study. Normal EF 55-60%.  . OTHER SURGICAL HISTORY     08/2016 or 09/2016 surgery on achilles tenso h/o staph infection heal. Dr. [name]  . removal of left hematoma Left    leg  . REVERSE SHOULDER ARTHROPLASTY Right 10/08/2015   Procedure: REVERSE SHOULDER ARTHROPLASTY;  Surgeon: [name], MD;  Location: [facility] ORS;  Service: Orthopedics;  Laterality: Right;  . SLEEVE GASTROPLASTY    . TONSILLECTOMY    . TOTAL KNEE ARTHROPLASTY    . TRANSESOPHAGEAL ECHOCARDIOGRAM  08/08/2013   Mild LVH, EF 60-65%. Moderate LA dilation and mild RA dilation. Mild MR with no evidence of stenosis and no evidence of endocarditis. A false chordae is noted.  . TRANSTHORACIC ECHOCARDIOGRAM  08/03/2013   Mild-Moderate concentric LVH, EF 60-65%. Normal diastolic function. Mild LA dilation. Mild MR with possible vegetation  - not confirmed on TEE   . ULNAR NERVE TRANSPOSITION Right 08/06/2016   Procedure: ULNAR NERVE DECOMPRESSION/TRANSPOSITION;  Surgeon: [name], MD;  Location: [facility] ORS;  Service: Orthopedics;  Laterality: Right;  . UPPER GI ENDOSCOPY    . VAGINAL HYSTERECTOMY      There were no vitals filed for this visit.  Subjective Assessment - 04/28/17 1258    Subjective  Patient reports no changes since last time. Reports that pain is always worst first thing in the morning. Reports stress incontinence with sit to stand. Patient is distressed by her growing disability and reliance on family for ADLs.   Patient is accompained by:  Family member    Pertinent History  Patient presents with hx of DM, PVD, peripheral neuropathy. In June 2018 patient suffered staph infection and subsequent necrosis of L Achilles tendon requiring partial resection. Patient has suffered three falls in last six months, including one where she felt a "pop" in the hip while getting out of the bathtub. Went to MD two days later  and received x-ray, negative; diagnosed with muscle strain and was recommended exercises. ER visit on 1/28 following third fall onto L hip, diagnosed with hematoma. Patient reports reduced sensation in feet L > R. Requires assistance to get into and out of bed since tendon surgery. Patient uses spc for ambulation and reports that small steps do not cause pain.    Limitations  Walking;Sitting;Standing;Lifting;House hold activities    How long can you sit comfortably?  1 hr    How long can you stand comfortably?  n/a    How long can you walk comfortably?  40 min    Diagnostic tests  x-ray, negative, CT scan showed torn muscle    Patient Stated Goals  Improve balance, reduce fall risk, and can go somewhere without FOF  Currently in Pain?  Yes    Pain Score  4   Pain Location  Hip    Pain Orientation  Left    Pain Descriptors / Indicators  Aching    Pain Type  Acute pain    Pain Onset  1 to 4 weeks ago    Pain Frequency  Constant    Aggravating Factors   moving leg    Pain Relieving Factors  rest    Effect of Pain on Daily Activities  dependent or modified I with adls    Multiple Pain Sites  Yes    Pain Score  2    Pain Location  Hip    Pain Orientation  Right    Pain Descriptors / Indicators  Aching    Pain Type  Acute pain    Pain Onset  1 to 4 weeks ago    Pain Frequency  Intermittent    Aggravating Factors   stepping up with RLE       Merrilee Jansky: 29/56. Indicates high fall risk (~100%), need for walker. Patient advised to resume using rolling walker for household mobility.  Standing in parallel bars: LLE: Standing hamstring curls x 10. Patient reported pain at end of set. Standing hip ext. x3, discontinued due to pain.  Patient presented with pain with active LLE hip flexion, abduction, adduction.  Strengthening of RLE limited by WB precautions over LLE due to ulcer on heel.  Seated: Patient reported pain with end-range hip flexion, adduction, and abduction and end-range knee  extension on LLE. Patient able to tolerate isometrics.  Isometric hip flexion at 90 deg. X 10, cues to hold 3 sec. Isometric knee ext. at 90-90 x 10, cues to hold 3 sec; patient reported pain. Reduced intensity to 50% maximal isometric contraction and completed set successfully. Isometric hip abduction x 10, cues to hold 3 sec. patient reported pain. Reduced intensity to 50% maximal isometric contraction and completed set successfully.  Patient advised to remain active with RLE and to perform isometric exercises with LLE to avoid atrophy during healing.  Isometrics added to HEP.    PT Education - 04/28/17 1259    Education provided  Yes    Education Details  plan of care, recommendations    Person(s) Educated  Patient;Spouse    Methods  Explanation    Comprehension  Verbalized understanding;Returned demonstration;Need further instruction       PT Short Term Goals - 04/26/17 1502      PT SHORT TERM GOAL #1   Title  Patient will be independent in home exercise program to improve strength/mobility for better functional independence with bed mobility and ADLs.    Baseline  No HEP    Time  4    Period  Weeks    Status  New    Target Date  05/24/17      PT SHORT TERM GOAL #2   Title  Patient will report a worst pain score of 3/10 VAS in the L hip to improve tolerance with ADLs and reduce symptoms with bed mobility activities.      Baseline  6/10 with medication    Time  4    Period  Weeks    Status  New    Target Date  05/24/17      PT SHORT TERM GOAL #3   Title  Patient will demonstrate sit to supine bed mobility, supine to sit bed mobility, and rolling L and R with minimal assistance for improved functional  mobility and independence    Baseline  Patient requires mod-max A for bed mobility    Time  4    Period  Weeks    Status  New    Target Date  05/24/17        PT Long Term Goals - 04/26/17 1504      PT LONG TERM GOAL #1   Title  Patient (>25 years old) will improve  10-meter walk test to 0.6 meters/sec with least restrictive AD to demonstrate reduced fall risk and capacity for limited community ambulation.    Baseline  0.21 meters/sec    Time  8    Period  Weeks    Status  New    Target Date  06/21/17      PT LONG TERM GOAL #2   Title  Patient will improve LEFS score to 40/80 to demonstrate improved functional mobility and independence with ADLs    Baseline  9/80    Time  8    Period  Weeks    Status  New    Target Date  06/21/17      PT LONG TERM GOAL #3   Title  Patient will demonstrate ability to perform bed mobility independently for improved functional mobility and independence.    Baseline  Patient requires mod-max A for bed mobility    Time  8    Period  Weeks    Status  New    Target Date  06/21/17      PT LONG TERM GOAL #4   Title  --    Baseline  --    Time  --    Period  --    Status  --    Target Date  --            Plan - 04/28/17 1925    Clinical Impression Statement  Patient presented with note from MD limiting WB on L heel secondary to diabetic ulcer. Patient completed [place] balance assessment with a score of 29/56 indicating a high fall risk, need for walker. Patient educated concerning need for AD and appropriate use of rolling walker reviewed. Patient has rolling walker at home and will begin using to reduce fall risk. Patient restricted in exercising LLE due to pain; able to perform submaximal isometrics in hip flexion, abduction, and knee extension in sitting; able to perform active knee flexion in standing. Patient limited in exercising RLE due to WB restrictions; advised to continue moving RLE through available ROM in non-WB positions. Isometrics added to HEP to limit LLE atrophy during healing. Incontinence episode noted after patient left (chair was wet). PT suspects possible discord between patient and husband due to patient's longstanding disability and patient report.     Rehab Potential  Fair    Clinical  Impairments Affecting Rehab Potential  - comorbidities, age, severity / + family support, acute injury    PT Frequency  2x / week    PT Duration  8 weeks    PT Treatment/Interventions  Aquatic Therapy;ADLs/Self Care Home Management;Cryotherapy;Electrical Stimulation;Moist Heat;Ultrasound;DME Instruction;Gait training;Stair training;Functional mobility training;Therapeutic activities;Therapeutic exercise;Balance training;Neuromuscular re-education;Patient/family education;Manual techniques;Compression bandaging;Scar mobilization;Passive range of motion;Dry needling;Energy conservation;Taping    PT Next Visit Plan  Strengthening, bed mobility and walker education    Consulted and Agree with Plan of Care  Patient       Patient will benefit from skilled therapeutic intervention in order to improve the following deficits and impairments:  Abnormal gait, Decreased activity tolerance, Decreased balance,  Decreased endurance, Decreased coordination, Decreased mobility, Decreased range of motion, Decreased safety awareness, Difficulty walking, Decreased strength, Hypomobility, Impaired perceived functional ability, Impaired UE functional use, Obesity, Impaired flexibility, Pain  Visit Diagnosis: Pain in left hip  Difficulty in walking, not elsewhere classified  Muscle weakness (generalized)     Problem List Patient Active Problem List   Diagnosis Date Noted  . Pain of left hip joint 04/22/2017  . Abnormality of gait and mobility 04/22/2017  . Fall with injury 02/02/2017  . Anemia 11/18/2016  . Asthma 11/18/2016  . Carpal tunnel syndrome 11/18/2016  . Pain in joint involving ankle and foot 11/18/2016  . Obstructive sleep apnea of adult 11/18/2016  . Spinal stenosis of thoracic region 11/18/2016  . Abscess of tendon of left foot 09/21/2016  . Cubital tunnel syndrome on right 08/07/2016  . Chronic pain syndrome 02/25/2016  . Long term current use of opiate analgesic 02/25/2016  . Chronic  right shoulder pain 02/25/2016  . Status post reverse total shoulder replacement 10/08/2015  . 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 Morbid obesity with BMI of 40.0-44.9, adult ([place]) 09/23/2015  . Chronic prescription benzodiazepine use 09/23/2015  . Osteoarthritis, multiple sites 09/23/2015  . Other shoulder lesions (Right) 08/28/2015  . Cervical radiculitis 08/06/2015  . Opiate use (15 MME/Day) 01/16/2015  . Impingement syndrome of shoulder region 01/07/2015  . Mixed incontinence 11/26/2014  . Atrophic vaginitis 11/26/2014  . Anxiety and depression 09/21/2014  . Bladder cystocele 09/21/2014  . Gastro-esophageal reflux disease without esophagitis 09/21/2014  . DM (diabetes mellitus) type II, controlled, with peripheral vascular disorder ([place]) 08/31/2014  . Diabetic peripheral neuropathy associated with type 2 diabetes mellitus ([place]) 08/31/2014  . Asthma, moderate persistent, well-controlled 08/31/2014  . Hypothyroidism, adult 08/31/2014  . Peripheral vascular disease due to secondary diabetes mellitus ([place]) 12/11/2013  . Apnea, sleep 10/19/2013  . Paroxysmal atrial fibrillation ([place]); CHA2DS2-Vasc Score 5. On Pradaxa 07/27/2013  . Essential hypertension 07/27/2013   Virgia Land, SPT This entire session was performed under direct supervision and direction of a licensed therapist/therapist assistant . I have personally read, edited and approve of the note as written. Alanson Puls, PT, DPT 04/29/2017, 9:46 AM  [place] MAIN [facility] SERVICES [address] [place], [place], [address] Phone: [phone]   Fax:  [phone]  Name: [name] MRN: [id number] Date of Birth: Feb 06, 1951

## 2017-04-29 DIAGNOSIS — J45909 Unspecified asthma, uncomplicated: Secondary | ICD-10-CM | POA: Diagnosis not present

## 2017-04-29 DIAGNOSIS — L0390 Cellulitis, unspecified: Secondary | ICD-10-CM | POA: Diagnosis not present

## 2017-04-29 DIAGNOSIS — I10 Essential (primary) hypertension: Secondary | ICD-10-CM | POA: Diagnosis not present

## 2017-04-29 DIAGNOSIS — L02611 Cutaneous abscess of right foot: Secondary | ICD-10-CM | POA: Diagnosis not present

## 2017-04-29 DIAGNOSIS — M129 Arthropathy, unspecified: Secondary | ICD-10-CM | POA: Diagnosis not present

## 2017-04-29 DIAGNOSIS — Z96619 Presence of unspecified artificial shoulder joint: Secondary | ICD-10-CM | POA: Diagnosis not present

## 2017-04-29 DIAGNOSIS — R0602 Shortness of breath: Secondary | ICD-10-CM | POA: Diagnosis not present

## 2017-04-29 DIAGNOSIS — E069 Thyroiditis, unspecified: Secondary | ICD-10-CM | POA: Diagnosis not present

## 2017-04-29 NOTE — Telephone Encounter (Signed)
Patients picked up RX today.

## 2017-04-29 NOTE — Telephone Encounter (Signed)
You have already done this correct?

## 2017-04-29 NOTE — Telephone Encounter (Signed)
I did it on 2/5 on printer printed again will sign again pt needs to pick it up   [place]

## 2017-04-30 DIAGNOSIS — M25551 Pain in right hip: Secondary | ICD-10-CM | POA: Insufficient documentation

## 2017-04-30 DIAGNOSIS — S76011A Strain of muscle, fascia and tendon of right hip, initial encounter: Secondary | ICD-10-CM | POA: Insufficient documentation

## 2017-04-30 DIAGNOSIS — S76012D Strain of muscle, fascia and tendon of left hip, subsequent encounter: Secondary | ICD-10-CM | POA: Diagnosis not present

## 2017-05-04 ENCOUNTER — Encounter: Payer: Self-pay | Admitting: Physical Therapy

## 2017-05-04 ENCOUNTER — Ambulatory Visit: Payer: PPO | Admitting: Physical Therapy

## 2017-05-04 NOTE — Therapy (Signed)
[place] MAIN [facility] SERVICES [address] [place], [place], [address] Phone: [phone]   Fax:  [phone]  Physical Therapy Treatment  Patient Details  Name: [name] MRN: [id number] Date of Birth: [birth year] Referring Provider: Dr. Roland Rack   Encounter Date: 05/04/2017    Past Medical History:  Diagnosis Date  . Abnormal antibody titer   . Anxiety and depression   . Arthritis   . Asthma   . Cystocele   . Depression   . Diabetes ([place])   . DVT (deep venous thrombosis) (HCC)    Righ calf  . Dysrhythmia   . Edema   . GERD (gastroesophageal reflux disease)   . Heart murmur    Echocardiogram June 2015: Mild MR (possible vegetation seen on TTE, not seen on TEE), normal LV size with moderate concentric LVH. Normal function EF 60-65%. Normal diastolic function. Mild LA dilation.  Marland Kitchen History of IBS   . History of methicillin resistant staphylococcus aureus (MRSA) 2008  . Hyperlipidemia   . Hypertension   . Hypothyroidism   . Insomnia   . Kidney stone    kidney stones with lithotripsy  . Neuropathy involving both lower extremities   . Obesity, Class II, BMI 35-39.9, with comorbidity   . Paroxysmal atrial fibrillation ([place]) 2007   In June 2015, Cardiac Event Monitor: Mostly SR/sinus arrhythmia with PVCs that are frequent. Short bursts of A. fib lasting several minutes;; CHA2DS2-VASc Score = 5 (age, Female, PVD, DM, HTN)  . Peripheral vascular disease ([place])   . Sleep apnea    use C-PAP  . Urinary incontinence   . Venous stasis dermatitis of both lower extremities     Past Surgical History:  Procedure Laterality Date  . ANKLE SURGERY Right   . APPLICATION OF WOUND VAC Left 06/27/2015   Procedure: APPLICATION OF WOUND VAC ( POSSIBLE ) ;  Surgeon: [name], MD;  Location: [facility] ORS;  Service: Vascular;  Laterality: Left;  . APPLICATION OF WOUND VAC Left 09/25/2016   Procedure: APPLICATION OF WOUND VAC;  Surgeon: [name],  DPM;  Location: [facility] ORS;  Service: Podiatry;  Laterality: Left;  . arm surgery     right    . CHOLECYSTECTOMY    . COLONOSCOPY    . COLONOSCOPY WITH PROPOFOL N/A 01/11/2017   Procedure: COLONOSCOPY WITH PROPOFOL;  Surgeon: [name], MD;  Location: [facility] ENDOSCOPY;  Service: Endoscopy;  Laterality: N/A;  . COLONOSCOPY WITH PROPOFOL N/A 02/22/2017   Procedure: COLONOSCOPY WITH PROPOFOL;  Surgeon: [name], MD;  Location: [facility] ENDOSCOPY;  Service: Endoscopy;  Laterality: N/A;  . ESOPHAGOGASTRODUODENOSCOPY (EGD) WITH PROPOFOL N/A 01/11/2017   Procedure: ESOPHAGOGASTRODUODENOSCOPY (EGD) WITH PROPOFOL;  Surgeon: [name], MD;  Location: [facility] ENDOSCOPY;  Service: Endoscopy;  Laterality: N/A;  . HERNIA REPAIR     umbilical  . HIATAL HERNIA REPAIR    . I&D EXTREMITY Left 06/27/2015   Procedure: IRRIGATION AND DEBRIDEMENT EXTREMITY            ( CALF HEMATOMA ) POSSIBLE WOUND VAC;  Surgeon: [name], MD;  Location: [facility] ORS;  Service: Vascular;  Laterality: Left;  . INCISION AND DRAINAGE OF WOUND Left 09/25/2016   Procedure: IRRIGATION AND DEBRIDEMENT - PARTIAL RESECTION OF ACHILLES TENDON WITH WOUND VAC APPLICATION;  Surgeon: [name], DPM;  Location: [facility] ORS;  Service: Podiatry;  Laterality: Left;  . IRRIGATION AND DEBRIDEMENT ABSCESS Left 09/07/2016   Procedure: IRRIGATION AND DEBRIDEMENT ABSCESS LEFT HEEL;  Surgeon: [name], DPM;  Location: [facility] ORS;  Service: Podiatry;  Laterality: Left;  . JOINT REPLACEMENT     left knee replacement  . KIDNEY SURGERY Right    kidney stones  . LITHOTRIPSY    . NM [facility] HX)  February 2017   Likely breast attenuation. LOW RISK study. Normal EF 55-60%.  . OTHER SURGICAL HISTORY     08/2016 or 09/2016 surgery on achilles tenso h/o staph infection heal. Dr. [name]  . removal of left hematoma Left    leg  . REVERSE SHOULDER ARTHROPLASTY Right 10/08/2015   Procedure: REVERSE SHOULDER ARTHROPLASTY;  Surgeon:  [name], MD;  Location: [facility] ORS;  Service: Orthopedics;  Laterality: Right;  . SLEEVE GASTROPLASTY    . TONSILLECTOMY    . TOTAL KNEE ARTHROPLASTY    . TRANSESOPHAGEAL ECHOCARDIOGRAM  08/08/2013   Mild LVH, EF 60-65%. Moderate LA dilation and mild RA dilation. Mild MR with no evidence of stenosis and no evidence of endocarditis. A false chordae is noted.  . TRANSTHORACIC ECHOCARDIOGRAM  08/03/2013   Mild-Moderate concentric LVH, EF 60-65%. Normal diastolic function. Mild LA dilation. Mild MR with possible vegetation  - not confirmed on TEE   . ULNAR NERVE TRANSPOSITION Right 08/06/2016   Procedure: ULNAR NERVE DECOMPRESSION/TRANSPOSITION;  Surgeon: [name], MD;  Location: [facility] ORS;  Service: Orthopedics;  Laterality: Right;  . UPPER GI ENDOSCOPY    . VAGINAL HYSTERECTOMY      There were no vitals filed for this visit.  Subjective Assessment - 05/04/17 1508    Subjective  67 yo female presents to physical therapy with spc, husband, in wheelchair with acute L hip pain and hx of falls;. She arrives to appointment but then needs to use the bathroom and is not able to return due to having diarrhea..    Patient is accompained by:  Family member    Pertinent History  Patient presents with hx of DM, PVD, peripheral neuropathy. In June 2018 patient suffered staph infection and subsequent necrosis of L Achilles tendon requiring partial resection. Patient has suffered three falls in last six months, including one where she felt a "pop" in the hip while getting out of the bathtub. Went to [facility] two days later and received x-ray, negative; diagnosed with muscle strain and was recommended exercises. ER visit on 1/28 following third fall onto L hip, diagnosed with hematoma. Patient reports reduced sensation in feet L > R. Requires assistance to get into and out of bed since tendon surgery. Patient uses spc for ambulation and reports that small steps do not cause pain.    Limitations   Walking;Sitting;Standing;Lifting;House hold activities    How long can you sit comfortably?  1 hr    How long can you stand comfortably?  n/a    How long can you walk comfortably?  40 min    Diagnostic tests  x-ray, negative, CT scan showed torn muscle    Patient Stated Goals  Improve balance, reduce fall risk, and can go somewhere without FOF    Pain Onset  1 to 4 weeks ago    Pain Onset  1 to 4 weeks ago                                PT Short Term Goals - 04/26/17 1502      PT SHORT TERM GOAL #1   Title  Patient will be  independent in home exercise program to improve strength/mobility for better functional independence with bed mobility and ADLs.    Baseline  No HEP    Time  4    Period  Weeks    Status  New    Target Date  05/24/17      PT SHORT TERM GOAL #2   Title  Patient will report a worst pain score of 3/10 VAS in the L hip to improve tolerance with ADLs and reduce symptoms with bed mobility activities.      Baseline  6/10 with medication    Time  4    Period  Weeks    Status  New    Target Date  05/24/17      PT SHORT TERM GOAL #3   Title  Patient will demonstrate sit to supine bed mobility, supine to sit bed mobility, and rolling L and R with minimal assistance for improved functional mobility and independence    Baseline  Patient requires mod-max A for bed mobility    Time  4    Period  Weeks    Status  New    Target Date  05/24/17        PT Long Term Goals - 04/26/17 1504      PT LONG TERM GOAL #1   Title  Patient (>74 years old) will improve 10-meter walk test to 0.6 meters/sec with least restrictive AD to demonstrate reduced fall risk and capacity for limited community ambulation.    Baseline  0.21 meters/sec    Time  8    Period  Weeks    Status  New    Target Date  06/21/17      PT LONG TERM GOAL #2   Title  Patient will improve LEFS score to 40/80 to demonstrate improved functional mobility and independence with ADLs     Baseline  9/80    Time  8    Period  Weeks    Status  New    Target Date  06/21/17      PT LONG TERM GOAL #3   Title  Patient will demonstrate ability to perform bed mobility independently for improved functional mobility and independence.    Baseline  Patient requires mod-max A for bed mobility    Time  8    Period  Weeks    Status  New    Target Date  06/21/17      PT LONG TERM GOAL #4   Title  --    Baseline  --    Time  --    Period  --    Status  --    Target Date  --              Patient will benefit from skilled therapeutic intervention in order to improve the following deficits and impairments:     Visit Diagnosis: No diagnosis found.     Problem List Patient Active Problem List   Diagnosis Date Noted  . Pain of left hip joint 04/22/2017  . Abnormality of gait and mobility 04/22/2017  . Fall with injury 02/02/2017  . Anemia 11/18/2016  . Asthma 11/18/2016  . Carpal tunnel syndrome 11/18/2016  . Pain in joint involving ankle and foot 11/18/2016  . Obstructive sleep apnea of adult 11/18/2016  . Spinal stenosis of thoracic region 11/18/2016  . Abscess of tendon of left foot 09/21/2016  . Cubital tunnel syndrome on right 08/07/2016  . Chronic pain syndrome  02/25/2016  . Long term current use of opiate analgesic 02/25/2016  . Chronic right shoulder pain 02/25/2016  . Status post reverse total shoulder replacement 10/08/2015  . 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 Morbid obesity with BMI of 40.0-44.9, adult ([place]) 09/23/2015  . Chronic prescription benzodiazepine use 09/23/2015  . Osteoarthritis, multiple sites 09/23/2015  . Other shoulder lesions (Right) 08/28/2015  . Cervical radiculitis 08/06/2015  . Opiate use (15 MME/Day) 01/16/2015  . Impingement syndrome of shoulder region 01/07/2015  . Mixed incontinence 11/26/2014  . Atrophic vaginitis 11/26/2014  . Anxiety and depression 09/21/2014  . Bladder cystocele  09/21/2014  . Gastro-esophageal reflux disease without esophagitis 09/21/2014  . DM (diabetes mellitus) type II, controlled, with peripheral vascular disorder ([place]) 08/31/2014  . Diabetic peripheral neuropathy associated with type 2 diabetes mellitus ([place]) 08/31/2014  . Asthma, moderate persistent, well-controlled 08/31/2014  . Hypothyroidism, adult 08/31/2014  . Peripheral vascular disease due to secondary diabetes mellitus ([place]) 12/11/2013  . Apnea, sleep 10/19/2013  . Paroxysmal atrial fibrillation ([place]); CHA2DS2-Vasc Score 5. On Pradaxa 07/27/2013  . Essential hypertension 07/27/2013    Alanson Puls, [place] DPT 05/04/2017, 3:14 PM  [place] MAIN [facility] SERVICES [address] [place], [place], [address] Phone: [phone]   Fax:  [phone]  Name: [name] MRN: [id number] Date of Birth: [birth year]

## 2017-05-05 DIAGNOSIS — L97322 Non-pressure chronic ulcer of left ankle with fat layer exposed: Secondary | ICD-10-CM | POA: Diagnosis not present

## 2017-05-05 DIAGNOSIS — L97511 Non-pressure chronic ulcer of other part of right foot limited to breakdown of skin: Secondary | ICD-10-CM | POA: Diagnosis not present

## 2017-05-05 DIAGNOSIS — E1143 Type 2 diabetes mellitus with diabetic autonomic (poly)neuropathy: Secondary | ICD-10-CM | POA: Diagnosis not present

## 2017-05-06 ENCOUNTER — Ambulatory Visit: Payer: PPO | Admitting: Physical Therapy

## 2017-05-06 VITALS — BP 184/86 | HR 46

## 2017-05-06 DIAGNOSIS — M25552 Pain in left hip: Secondary | ICD-10-CM

## 2017-05-06 DIAGNOSIS — R262 Difficulty in walking, not elsewhere classified: Secondary | ICD-10-CM

## 2017-05-06 DIAGNOSIS — M6281 Muscle weakness (generalized): Secondary | ICD-10-CM

## 2017-05-06 NOTE — Therapy (Addendum)
[place] MAIN [facility] SERVICES [address] [place], [place], [address] Phone: [phone]   Fax:  [phone]  Physical Therapy Treatment  Patient Details  Name: [name] MRN: [id number] Date of Birth: 1950/06/04 Referring Provider: Dr. Roland Rack   Encounter Date: 05/06/2017  PT End of Session - 05/06/17 1443    Visit Number  3    Number of Visits  17    Date for PT Re-Evaluation  06/26/17    PT Start Time  2831    PT Stop Time  1432    PT Time Calculation (min)  44 min    Equipment Utilized During Treatment  Gait belt    Activity Tolerance  Patient tolerated treatment well;Patient limited by fatigue    Behavior During Therapy  [facility] for tasks assessed/performed       Past Medical History:  Diagnosis Date  . Abnormal antibody titer   . Anxiety and depression   . Arthritis   . Asthma   . Cystocele   . Depression   . Diabetes ([place])   . DVT (deep venous thrombosis) (HCC)    Righ calf  . Dysrhythmia   . Edema   . GERD (gastroesophageal reflux disease)   . Heart murmur    Echocardiogram June 2015: Mild MR (possible vegetation seen on TTE, not seen on TEE), normal LV size with moderate concentric LVH. Normal function EF 60-65%. Normal diastolic function. Mild LA dilation.  Marland Kitchen History of IBS   . History of methicillin resistant staphylococcus aureus (MRSA) 2008  . Hyperlipidemia   . Hypertension   . Hypothyroidism   . Insomnia   . Kidney stone    kidney stones with lithotripsy  . Neuropathy involving both lower extremities   . Obesity, Class II, BMI 35-39.9, with comorbidity   . Paroxysmal atrial fibrillation ([place]) 2007   In June 2015, Cardiac Event Monitor: Mostly SR/sinus arrhythmia with PVCs that are frequent. Short bursts of A. fib lasting several minutes;; CHA2DS2-VASc Score = 5 (age, Female, PVD, DM, HTN)  . Peripheral vascular disease ([place])   . Sleep apnea    use C-PAP  . Urinary incontinence   . Venous stasis dermatitis  of both lower extremities     Past Surgical History:  Procedure Laterality Date  . ANKLE SURGERY Right   . APPLICATION OF WOUND VAC Left 06/27/2015   Procedure: APPLICATION OF WOUND VAC ( POSSIBLE ) ;  Surgeon: [name], MD;  Location: [facility] ORS;  Service: Vascular;  Laterality: Left;  . APPLICATION OF WOUND VAC Left 09/25/2016   Procedure: APPLICATION OF WOUND VAC;  Surgeon: [name], DPM;  Location: [facility] ORS;  Service: Podiatry;  Laterality: Left;  . arm surgery     right    . CHOLECYSTECTOMY    . COLONOSCOPY    . COLONOSCOPY WITH PROPOFOL N/A 01/11/2017   Procedure: COLONOSCOPY WITH PROPOFOL;  Surgeon: [name], MD;  Location[facility] ENDOSCOPY;  Service: Endoscopy;  Laterality: N/A;  . COLONOSCOPY WITH PROPOFOL N/A 02/22/2017   Procedure: COLONOSCOPY WITH PROPOFOL;  Surgeon: [name], MD;  Location: [facility] ENDOSCOPY;  Service: Endoscopy;  Laterality: N/A;  . ESOPHAGOGASTRODUODENOSCOPY (EGD) WITH PROPOFOL N/A 01/11/2017   Procedure: ESOPHAGOGASTRODUODENOSCOPY (EGD) WITH PROPOFOL;  Surgeon: [name], MD;  Location: [facility] ENDOSCOPY;  Service: Endoscopy;  Laterality: N/A;  . HERNIA REPAIR     umbilical  . HIATAL HERNIA REPAIR    . I&D EXTREMITY Left 06/27/2015   Procedure:  IRRIGATION AND DEBRIDEMENT EXTREMITY            ( CALF HEMATOMA ) POSSIBLE WOUND VAC;  Surgeon: [name], MD;  Location: [facility] ORS;  Service: Vascular;  Laterality: Left;  . INCISION AND DRAINAGE OF WOUND Left 09/25/2016   Procedure: IRRIGATION AND DEBRIDEMENT - PARTIAL RESECTION OF ACHILLES TENDON WITH WOUND VAC APPLICATION;  Surgeon: [name], DPM;  Location: [facility] ORS;  Service: Podiatry;  Laterality: Left;  . IRRIGATION AND DEBRIDEMENT ABSCESS Left 09/07/2016   Procedure: IRRIGATION AND DEBRIDEMENT ABSCESS LEFT HEEL;  Surgeon: [name], DPM;  Location: [facility] ORS;  Service: Podiatry;  Laterality: Left;  . JOINT REPLACEMENT     left knee replacement  . KIDNEY SURGERY Right     kidney stones  . LITHOTRIPSY    . NM [facility]al[facility] HX)  February 2017   Likely breast attenuation. LOW RISK study. Normal EF 55-60%.  . OTHER SURGICAL HISTORY     08/2016 or 09/2016 surgery on achilles tenso h/o staph infection heal. Dr. [name]  . removal of left hematoma Left    leg  . REVERSE SHOULDER ARTHROPLASTY Right 10/08/2015   Procedure: REVERSE SHOULDER ARTHROPLASTY;  Surgeon: [name], MD;  Location: [facility] ORS;  Service: Orthopedics;  Laterality: Right;  . SLEEVE GASTROPLASTY    . TONSILLECTOMY    . TOTAL KNEE ARTHROPLASTY    . TRANSESOPHAGEAL ECHOCARDIOGRAM  08/08/2013   Mild LVH, EF 60-65%. Moderate LA dilation and mild RA dilation. Mild MR with no evidence of stenosis and no evidence of endocarditis. A false chordae is noted.  . TRANSTHORACIC ECHOCARDIOGRAM  08/03/2013   Mild-Moderate concentric LVH, EF 60-65%. Normal diastolic function. Mild LA dilation. Mild MR with possible vegetation  - not confirmed on TEE   . ULNAR NERVE TRANSPOSITION Right 08/06/2016   Procedure: ULNAR NERVE DECOMPRESSION/TRANSPOSITION;  Surgeon: [name], MD;  Location: [facility] ORS;  Service: Orthopedics;  Laterality: Right;  . UPPER GI ENDOSCOPY    . VAGINAL HYSTERECTOMY      Vitals:   05/06/17 1359  BP: (!) 184/86  Pulse: (!) 46    Subjective Assessment - 05/06/17 1357    Subjective  Patient reports to PT without husband and in [facility]; did not bring walker but reports that she has two at home including one bariatric walker. Reports recent change in BP medication and MD alteration to rocker shoe to cut heel off. Reports that pain in LLE hip has decreased and become more focal with no more pain on medial aspect but RLE posterior hip "where the hamstrings attach" has started to ache. Patient believes this is due to taking additional weight through RLE. Pt reports that ulcer on plantar surface of LLE is almost healed but ulcer on posterior calf (achilles) is worse.    Patient is accompained  by:  Family member    Pertinent History  Patient presents with hx of DM, PVD, peripheral neuropathy. In June 2018 patient suffered staph infection and subsequent necrosis of L Achilles tendon requiring partial resection. Patient has suffered three falls in last six months, including one where she felt a "pop" in the hip while getting out of the bathtub. Went to MD two days later and received x-ray, negative; diagnosed with muscle strain and was recommended exercises. ER visit on 1/28 following third fall onto L hip, diagnosed with hematoma. Patient reports reduced sensation in feet L > R. Requires assistance to get into and out of bed since tendon surgery. Patient  uses spc for ambulation and reports that small steps do not cause pain.    Limitations  Walking;Sitting;Standing;Lifting;House hold activities    How long can you sit comfortably?  1 hr    How long can you stand comfortably?  n/a    How long can you walk comfortably?  40 min    Diagnostic tests  x-ray, negative, CT scan showed torn muscle    Patient Stated Goals  Improve balance, reduce fall risk, and can go somewhere without FOF    Currently in Pain?  Yes    Pain Score  4     Pain Location  Hip    Pain Orientation  Left    Pain Descriptors / Indicators  Aching    Pain Type  Acute pain    Pain Onset  1 to 4 weeks ago    Pain Frequency  Constant    Aggravating Factors   moving leg    Pain Relieving Factors  rest    Effect of Pain on Daily Activities  dependent or modified I with ADLs    Multiple Pain Sites  Yes    Pain Score  2    Pain Location  Hip    Pain Orientation  Right;Posterior    Pain Descriptors / Indicators  Aching    Pain Type  Acute pain    Pain Onset  1 to 4 weeks ago    Pain Frequency  Intermittent    Aggravating Factors   STS or stepping up with RLE       TREATMENT  Vitals: BP: 184/86 HR: 46  Gait Training: Ambulation with walker 60 ft; patient demonstrates BLE external rotation presenting danger of  tripping on walker legs. Patient instructed to reduce external rotation and step into the walker's base of support; able to perform with minimal cueing. Patient able to walk upright and demonstrates no postural abnormalities.  Therapeutic activities: Stand to sit transfers with walker: Patient demonstrates stability and moderate eccentric control during sitting. Brings posterior thighs into contact with transfer surface without cues; therapist provided cues to reach back during transfer for increased safety. Sit to stand transfers with walker:  Patient demonstrates good strength through BUEs for assist to stand. Patient pushes down on walker appropriately, does not pull walker to herself. Patient cued to bring walker into contact with sitting surface for additional safety before attempting to rise; advised to push off of surface for additional support.  Repeated stand-to-sit and sit-to-stand x 3 with minimal cueing. Patient demonstrated ability to transfer safely independently with superviison.  Bed Mobility: Sitting EOB to side-lying R: patient demonstrates rough understanding of cantilevering owing to prior instruction but struggled to lay down on her shoulder without rolling posterior; cued to scoot back while seated EOB for additional room and stability when transferring to side-lying R. Patient able to perform independently with cues. Side-lying R to supine: patient able to perform independently. Supine to side-lying R: therapist provided cues to bend knees, reach across with LUE; patient demonstrates sufficient strength but needs additional practice to master; required min-mod A to stack BLEs in side-lying. Side-lying R to sitting EOB: patient able to perform independently with min-mod cues to push through UEs.  Repeated bed mobility x 2. Patient requires additional instruction with BLE placement.  Patient reports that she is planning to install a vertical grab bar on edge of bed to assist  with bed mobility.  Patient reports that her husband assists her with bed mobility that she believes  she can do independently; advised to do what she can independently to foster increased self-efficacy and reduce caregiver burden.        PT Education - 05/06/17 1442    Education provided  Yes    Education Details  Gait with walker; sit<>stand with walker; bed mobility sit EOB <> side-lying R <> supine    Person(s) Educated  Patient    Methods  Explanation;Tactile cues;Verbal cues    Comprehension  Verbalized understanding;Verbal cues required;Tactile cues required;Need further instruction       PT Short Term Goals - 04/26/17 1502      PT SHORT TERM GOAL #1   Title  Patient will be independent in home exercise program to improve strength/mobility for better functional independence with bed mobility and ADLs.    Baseline  No HEP    Time  4    Period  Weeks    Status  New    Target Date  05/24/17      PT SHORT TERM GOAL #2   Title  Patient will report a worst pain score of 3/10 VAS in the L hip to improve tolerance with ADLs and reduce symptoms with bed mobility activities.      Baseline  6/10 with medication    Time  4    Period  Weeks    Status  New    Target Date  05/24/17      PT SHORT TERM GOAL #3   Title  Patient will demonstrate sit to supine bed mobility, supine to sit bed mobility, and rolling L and R with minimal assistance for improved functional mobility and independence    Baseline  Patient requires mod-max A for bed mobility    Time  4    Period  Weeks    Status  New    Target Date  05/24/17        PT Long Term Goals - 04/26/17 1504      PT LONG TERM GOAL #1   Title  Patient (>78 years old) will improve 10-meter walk test to 0.6 meters/sec with least restrictive AD to demonstrate reduced fall risk and capacity for limited community ambulation.    Baseline  0.21 meters/sec    Time  8    Period  Weeks    Status  New    Target Date  06/21/17      PT  LONG TERM GOAL #2   Title  Patient will improve LEFS score to 40/80 to demonstrate improved functional mobility and independence with ADLs    Baseline  9/80    Time  8    Period  Weeks    Status  New    Target Date  06/21/17      PT LONG TERM GOAL #3   Title  Patient will demonstrate ability to perform bed mobility independently for improved functional mobility and independence.    Baseline  Patient requires mod-max A for bed mobility    Time  8    Period  Weeks    Status  New    Target Date  06/21/17      PT LONG TERM GOAL #4   Title  --    Baseline  --    Time  --    Period  --    Status  --    Target Date  --            Plan - 05/06/17 1555    Clinical Impression  Statement  Patient presents without walker or husband today. Reports that LLE hip feels much better and ulcer on LLE plantar surface of foot is healing well but ulcer on LLE achilles is worsening. Patient demonstrated safe ambulation with min-mod cues with walker and safe sit to stand and stand to sit with walker with min-mod cueing. Patient struggles with bed mobility including sitting EOB to side-lying R and side-lying R to sitting EOB. Patient demonstrates sufficient strength and range of motion to perform but requires additional instruction and repetition to perform consistently. Patient's husband (with hx of LBP) indicated to this therapist in the waiting room that he pulls patient up by her arm when they are at home; this therapist requested that husband be present for next therapy session for instruction on safe transfers and assistance. Patient was adamant about wanting to return to full independence and wanting to work on her balance; patient educated on plan of care including needing to allow LLE to heal for improved mobility and balance. Patient would benefit from continued physical therapy to improve functional mobility, reduce caregiver burden, and improve strength and balance to reduce fall risk.    Rehab  Potential  Fair    Clinical Impairments Affecting Rehab Potential  - comorbidities, age, severity / + family support, acute injury    PT Frequency  2x / week    PT Duration  8 weeks    PT Treatment/Interventions  Aquatic Therapy;ADLs/Self Care Home Management;Cryotherapy;Electrical Stimulation;Moist Heat;Ultrasound;DME Instruction;Gait training;Stair training;Functional mobility training;Therapeutic activities;Therapeutic exercise;Balance training;Neuromuscular re-education;Patient/family education;Manual techniques;Compression bandaging;Scar mobilization;Passive range of motion;Dry needling;Energy conservation;Taping    PT Next Visit Plan  Bed mobility, LE strengthening    Consulted and Agree with Plan of Care  Patient       Patient will benefit from skilled therapeutic intervention in order to improve the following deficits and impairments:  Abnormal gait, Decreased activity tolerance, Decreased balance, Decreased endurance, Decreased coordination, Decreased mobility, Decreased range of motion, Decreased safety awareness, Difficulty walking, Decreased strength, Hypomobility, Impaired perceived functional ability, Impaired UE functional use, Obesity, Impaired flexibility, Pain  Visit Diagnosis: Pain in left hip  Difficulty in walking, not elsewhere classified  Muscle weakness (generalized)     Problem List Patient Active Problem List   Diagnosis Date Noted  . Pain of left hip joint 04/22/2017  . Abnormality of gait and mobility 04/22/2017  . Fall with injury 02/02/2017  . Anemia 11/18/2016  . Asthma 11/18/2016  . Carpal tunnel syndrome 11/18/2016  . Pain in joint involving ankle and foot 11/18/2016  . Obstructive sleep apnea of adult 11/18/2016  . Spinal stenosis of thoracic region 11/18/2016  . Abscess of tendon of left foot 09/21/2016  . Cubital tunnel syndrome on right 08/07/2016  . Chronic pain syndrome 02/25/2016  . Long term current use of opiate analgesic 02/25/2016  .  Chronic right shoulder pain 02/25/2016  . Status post reverse total shoulder replacement 10/08/2015  . 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 Morbid obesity with BMI of 40.0-44.9, adult ([place]) 09/23/2015  . Chronic prescription benzodiazepine use 09/23/2015  . Osteoarthritis, multiple sites 09/23/2015  . Other shoulder lesions (Right) 08/28/2015  . Cervical radiculitis 08/06/2015  . Opiate use (15 MME/Day) 01/16/2015  . Impingement syndrome of shoulder region 01/07/2015  . Mixed incontinence 11/26/2014  . Atrophic vaginitis 11/26/2014  . Anxiety and depression 09/21/2014  . Bladder cystocele 09/21/2014  . Gastro-esophageal reflux disease without esophagitis 09/21/2014  . DM (diabetes mellitus) type II, controlled, with peripheral  vascular disorder ([place]) 08/31/2014  . Diabetic peripheral neuropathy associated with type 2 diabetes mellitus ([place]) 08/31/2014  . Asthma, moderate persistent, well-controlled 08/31/2014  . Hypothyroidism, adult 08/31/2014  . Peripheral vascular disease due to secondary diabetes mellitus ([place]) 12/11/2013  . Apnea, sleep 10/19/2013  . Paroxysmal atrial fibrillation ([place]); CHA2DS2-Vasc Score 5. On Pradaxa 07/27/2013  . Essential hypertension 07/27/2013   Virgia Land, SPT 05/06/2017, 5:59 PM   This entire session was performed under direct supervision and direction of a licensed therapist/therapist assistant . I have personally read, edited and approve of the note as written. Collie Siad PT, [place] MAIN [facility] SERVICES [address] [place], [place], [address] Phone: [phone]   Fax:  [phone]  Name: Roux Brandy MRN: [id number] Date of Birth: [birth year]

## 2017-05-10 ENCOUNTER — Other Ambulatory Visit (INDEPENDENT_AMBULATORY_CARE_PROVIDER_SITE_OTHER): Payer: PPO

## 2017-05-10 DIAGNOSIS — E039 Hypothyroidism, unspecified: Secondary | ICD-10-CM

## 2017-05-10 DIAGNOSIS — E119 Type 2 diabetes mellitus without complications: Secondary | ICD-10-CM | POA: Diagnosis not present

## 2017-05-10 DIAGNOSIS — E559 Vitamin D deficiency, unspecified: Secondary | ICD-10-CM

## 2017-05-10 DIAGNOSIS — I10 Essential (primary) hypertension: Secondary | ICD-10-CM

## 2017-05-10 DIAGNOSIS — E538 Deficiency of other specified B group vitamins: Secondary | ICD-10-CM | POA: Diagnosis not present

## 2017-05-10 DIAGNOSIS — Z1159 Encounter for screening for other viral diseases: Secondary | ICD-10-CM

## 2017-05-10 DIAGNOSIS — Z13818 Encounter for screening for other digestive system disorders: Secondary | ICD-10-CM | POA: Diagnosis not present

## 2017-05-10 LAB — URINALYSIS, ROUTINE W REFLEX MICROSCOPIC
Bilirubin Urine: NEGATIVE
Hgb urine dipstick: NEGATIVE
NITRITE: NEGATIVE
PH: 5.5 (ref 5.0–8.0)
RBC / HPF: NONE SEEN (ref 0–?)
TOTAL PROTEIN, URINE-UPE24: NEGATIVE
URINE GLUCOSE: NEGATIVE
UROBILINOGEN UA: 0.2 (ref 0.0–1.0)

## 2017-05-10 LAB — CBC WITH DIFFERENTIAL/PLATELET
BASOS PCT: 1.5 % (ref 0.0–3.0)
Basophils Absolute: 0.1 10*3/uL (ref 0.0–0.1)
Eosinophils Absolute: 0.4 10*3/uL (ref 0.0–0.7)
Eosinophils Relative: 7.3 % — ABNORMAL HIGH (ref 0.0–5.0)
HEMATOCRIT: 33.9 % — AB (ref 36.0–46.0)
Hemoglobin: 10.9 g/dL — ABNORMAL LOW (ref 12.0–15.0)
LYMPHS ABS: 1.1 10*3/uL (ref 0.7–4.0)
LYMPHS PCT: 19.4 % (ref 12.0–46.0)
MCHC: 32.3 g/dL (ref 30.0–36.0)
MCV: 85 fl (ref 78.0–100.0)
Monocytes Absolute: 0.5 10*3/uL (ref 0.1–1.0)
Monocytes Relative: 9.2 % (ref 3.0–12.0)
NEUTROS ABS: 3.5 10*3/uL (ref 1.4–7.7)
NEUTROS PCT: 62.6 % (ref 43.0–77.0)
PLATELETS: 271 10*3/uL (ref 150.0–400.0)
RBC: 3.98 Mil/uL (ref 3.87–5.11)
RDW: 15.8 % — AB (ref 11.5–15.5)
WBC: 5.6 10*3/uL (ref 4.0–10.5)

## 2017-05-10 LAB — COMPREHENSIVE METABOLIC PANEL
ALT: 9 U/L (ref 0–35)
AST: 12 U/L (ref 0–37)
Albumin: 3.9 g/dL (ref 3.5–5.2)
Alkaline Phosphatase: 64 U/L (ref 39–117)
BUN: 29 mg/dL — ABNORMAL HIGH (ref 6–23)
CALCIUM: 9 mg/dL (ref 8.4–10.5)
CHLORIDE: 107 meq/L (ref 96–112)
CO2: 27 meq/L (ref 19–32)
Creatinine, Ser: 0.89 mg/dL (ref 0.40–1.20)
GFR: 67.1 mL/min (ref >60.00)
GLUCOSE: 108 mg/dL — AB (ref 70–99)
Potassium: 4.7 mEq/L (ref 3.5–5.1)
Sodium: 141 mEq/L (ref 135–145)
Total Bilirubin: 0.4 mg/dL (ref 0.2–1.2)
Total Protein: 6.6 g/dL (ref 6.0–8.3)

## 2017-05-10 LAB — LIPID PANEL
CHOLESTEROL: 149 mg/dL (ref 0–200)
HDL: 65.5 mg/dL (ref >39.00)
LDL Cholesterol: 67 mg/dL (ref 0–99)
NONHDL: 83.24
Total CHOL/HDL Ratio: 2
Triglycerides: 81 mg/dL (ref 0.0–149.0)
VLDL: 16.2 mg/dL (ref 0.0–40.0)

## 2017-05-10 LAB — MICROALBUMIN / CREATININE URINE RATIO
CREATININE, U: 158.8 mg/dL
MICROALB/CREAT RATIO: 1.7 mg/g (ref 0.0–30.0)
Microalb, Ur: 2.7 mg/dL — ABNORMAL HIGH (ref 0.0–1.9)

## 2017-05-10 LAB — VITAMIN B12: Vitamin B-12: 502 pg/mL (ref 211–911)

## 2017-05-10 LAB — HEMOGLOBIN A1C: HEMOGLOBIN A1C: 6.4 % (ref 4.6–6.5)

## 2017-05-10 LAB — VITAMIN D 25 HYDROXY (VIT D DEFICIENCY, FRACTURES): VITD: 32.15 ng/mL (ref 30.00–100.00)

## 2017-05-10 LAB — TSH: TSH: 6.17 u[IU]/mL — AB (ref 0.35–4.50)

## 2017-05-11 ENCOUNTER — Ambulatory Visit: Payer: PPO | Admitting: Physical Therapy

## 2017-05-11 ENCOUNTER — Other Ambulatory Visit: Payer: PPO

## 2017-05-11 LAB — HEPATITIS C ANTIBODY
HEP C AB: NONREACTIVE
SIGNAL TO CUT-OFF: 0.04 (ref <1.00)

## 2017-05-11 LAB — HEPATITIS B SURFACE ANTIGEN: Hepatitis B Surface Ag: NONREACTIVE

## 2017-05-11 LAB — HEPATITIS B SURFACE ANTIBODY, QUANTITATIVE: Hepatitis B-Post: <5 m[IU]/mL — ABNORMAL LOW (ref >=10)

## 2017-05-12 DIAGNOSIS — R0609 Other forms of dyspnea: Secondary | ICD-10-CM | POA: Diagnosis not present

## 2017-05-12 DIAGNOSIS — G4733 Obstructive sleep apnea (adult) (pediatric): Secondary | ICD-10-CM | POA: Diagnosis not present

## 2017-05-12 DIAGNOSIS — J4520 Mild intermittent asthma, uncomplicated: Secondary | ICD-10-CM | POA: Diagnosis not present

## 2017-05-13 ENCOUNTER — Ambulatory Visit: Payer: PPO | Admitting: Physical Therapy

## 2017-05-13 ENCOUNTER — Encounter: Payer: Self-pay | Admitting: Internal Medicine

## 2017-05-13 ENCOUNTER — Ambulatory Visit (INDEPENDENT_AMBULATORY_CARE_PROVIDER_SITE_OTHER): Payer: PPO | Admitting: Internal Medicine

## 2017-05-13 ENCOUNTER — Encounter: Payer: Self-pay | Admitting: Physical Therapy

## 2017-05-13 VITALS — BP 160/88 | HR 87 | Temp 98.1°F | Ht 65.5 in | Wt 257.8 lb

## 2017-05-13 DIAGNOSIS — D649 Anemia, unspecified: Secondary | ICD-10-CM

## 2017-05-13 DIAGNOSIS — E279 Disorder of adrenal gland, unspecified: Secondary | ICD-10-CM | POA: Diagnosis not present

## 2017-05-13 DIAGNOSIS — M6281 Muscle weakness (generalized): Secondary | ICD-10-CM

## 2017-05-13 DIAGNOSIS — E039 Hypothyroidism, unspecified: Secondary | ICD-10-CM

## 2017-05-13 DIAGNOSIS — M25552 Pain in left hip: Secondary | ICD-10-CM

## 2017-05-13 DIAGNOSIS — Z1231 Encounter for screening mammogram for malignant neoplasm of breast: Secondary | ICD-10-CM

## 2017-05-13 DIAGNOSIS — E278 Other specified disorders of adrenal gland: Secondary | ICD-10-CM

## 2017-05-13 DIAGNOSIS — K760 Fatty (change of) liver, not elsewhere classified: Secondary | ICD-10-CM | POA: Diagnosis not present

## 2017-05-13 DIAGNOSIS — I10 Essential (primary) hypertension: Secondary | ICD-10-CM

## 2017-05-13 DIAGNOSIS — I4891 Unspecified atrial fibrillation: Secondary | ICD-10-CM

## 2017-05-13 DIAGNOSIS — J4540 Moderate persistent asthma, uncomplicated: Secondary | ICD-10-CM

## 2017-05-13 DIAGNOSIS — R269 Unspecified abnormalities of gait and mobility: Secondary | ICD-10-CM | POA: Diagnosis not present

## 2017-05-13 DIAGNOSIS — R262 Difficulty in walking, not elsewhere classified: Secondary | ICD-10-CM

## 2017-05-13 MED ORDER — FERROUS GLUCONATE 324 (37.5 FE) MG PO TABS: 1.0000 | ORAL_TABLET | Freq: Every day | ORAL | 3 refills | Status: DC

## 2017-05-13 MED ORDER — LEVOTHYROXINE SODIUM 175 MCG PO TABS: 175.0000 ug | ORAL_TABLET | Freq: Every day | ORAL | 0 refills | Status: DC

## 2017-05-13 MED ORDER — AMLODIPINE BESYLATE 5 MG PO TABS: 5.0000 mg | ORAL_TABLET | Freq: Every day | ORAL | 3 refills | Status: DC

## 2017-05-13 NOTE — Progress Notes (Signed)
Pre visit review using our clinic review tool, if applicable. No additional management support is needed unless otherwise documented below in the visit note. 

## 2017-05-13 NOTE — Patient Instructions (Signed)
Heel Slides    Squeeze pelvic floor and hold. Slide left heel along bed towards bottom. Hold for _2_ seconds. Slide back to flat knee position. Repeat _10_ times. Do _10_ times a day. Repeat with other leg.    Copyright  VHI. All rights reserved.  Small Ball Marching Supine    Lie supine with small ball under tailbone, knees flexed. Raise one leg a few inches. Hold briefly, return and raise other leg. Keep hips stationary. Do _2_ sets of _10_ repetitions.  Copyright  VHI. All rights reserved.  ABDUCTION: Supine (Active)    Lie on back, legs straight. Draw right leg out to the side as far as possible. Complete _2_ sets of _10_ repetitions. Perform _1_ sessions per day.  Copyright  VHI. All rights reserved.  ADDUCTION: Supine (Active)    Lie on back, right leg out to the side as far as possible. Draw leg inward.  Complete _2_ sets of _10_ repetitions. Perform _1_ sessions per day.  Copyright  VHI. All rights reserved.

## 2017-05-13 NOTE — Therapy (Addendum)
[place] [facility] SERVICES [address] [place], [place], [address] Phone: [phone]   Fax:  [phone]  Physical Therapy Treatment  Patient Details  Name: [name] MRN: [id number] Date of Birth: September 09, 1950 Referring Provider: Dr. Roland Rack   Encounter Date: 05/13/2017  PT End of Session - 05/13/17 1259    Visit Number  4    Number of Visits  17    Date for PT Re-Evaluation  06/26/17    PT Start Time  1302    PT Stop Time  1856    PT Time Calculation (min)  45 min    Equipment Utilized During Treatment  Gait belt    Activity Tolerance  Patient tolerated treatment well;Patient limited by pain    Behavior During Therapy  [facility] for tasks assessed/performed       Past Medical History:  Diagnosis Date  . Abnormal antibody titer   . Anxiety and depression   . Arthritis   . Asthma   . Cystocele   . Depression   . Diabetes ([place])   . DVT (deep venous thrombosis) (HCC)    Righ calf  . Dysrhythmia   . Edema   . GERD (gastroesophageal reflux disease)   . Heart murmur    Echocardiogram June 2015: Mild MR (possible vegetation seen on TTE, not seen on TEE), normal LV size with moderate concentric LVH. Normal function EF 60-65%. Normal diastolic function. Mild LA dilation.  Marland Kitchen History of IBS   . History of methicillin resistant staphylococcus aureus (MRSA) 2008  . Hyperlipidemia   . Hypertension   . Hypothyroidism   . Insomnia   . Kidney stone    kidney stones with lithotripsy  . Neuropathy involving both lower extremities   . Obesity, Class II, BMI 35-39.9, with comorbidity   . Paroxysmal atrial fibrillation ([place]) 2007   In June 2015, Cardiac Event Monitor: Mostly SR/sinus arrhythmia with PVCs that are frequent. Short bursts of A. fib lasting several minutes;; CHA2DS2-VASc Score = 5 (age, Female, PVD, DM, HTN)  . Peripheral vascular disease ([place])   . Sleep apnea    use C-PAP  . Urinary incontinence   . Venous stasis dermatitis of  both lower extremities     Past Surgical History:  Procedure Laterality Date  . ANKLE SURGERY Right   . APPLICATION OF WOUND VAC Left 06/27/2015   Procedure: APPLICATION OF WOUND VAC ( POSSIBLE ) ;  Surgeon: [name], MD;  Location: [facility] ORS;  Service: Vascular;  Laterality: Left;  . APPLICATION OF WOUND VAC Left 09/25/2016   Procedure: APPLICATION OF WOUND VAC;  Surgeon: [name], DPM;  Location: [facility] ORS;  Service: Podiatry;  Laterality: Left;  . arm surgery     right    . CHOLECYSTECTOMY    . COLONOSCOPY    . COLONOSCOPY WITH PROPOFOL N/A 01/11/2017   Procedure: COLONOSCOPY WITH PROPOFOL;  Surgeon: [name], MD;  Location: [facility] ENDOSCOPY;  Service: Endoscopy;  Laterality: N/A;  . COLONOSCOPY WITH PROPOFOL N/A 02/22/2017   Procedure: COLONOSCOPY WITH PROPOFOL;  Surgeon: [name], MD;  Location: [facility] ENDOSCOPY;  Service: Endoscopy;  Laterality: N/A;  . ESOPHAGOGASTRODUODENOSCOPY (EGD) WITH PROPOFOL N/A 01/11/2017   Procedure: ESOPHAGOGASTRODUODENOSCOPY (EGD) WITH PROPOFOL;  Surgeon: [name], MD;  Location: [facility] ENDOSCOPY;  Service: Endoscopy;  Laterality: N/A;  . HERNIA REPAIR     umbilical  . HIATAL HERNIA REPAIR    . I&D EXTREMITY Left 06/27/2015   Procedure:  IRRIGATION AND DEBRIDEMENT EXTREMITY            ( CALF HEMATOMA ) POSSIBLE WOUND VAC;  Surgeon: [name], MD;  Location: [facility] ORS;  Service: Vascular;  Laterality: Left;  . INCISION AND DRAINAGE OF WOUND Left 09/25/2016   Procedure: IRRIGATION AND DEBRIDEMENT - PARTIAL RESECTION OF ACHILLES TENDON WITH WOUND VAC APPLICATION;  Surgeon: [name], DPM;  Location: [facility] ORS;  Service: Podiatry;  Laterality: Left;  . IRRIGATION AND DEBRIDEMENT ABSCESS Left 09/07/2016   Procedure: IRRIGATION AND DEBRIDEMENT ABSCESS LEFT HEEL;  Surgeon: [name], DPM;  Location: [facility] ORS;  Service: Podiatry;  Laterality: Left;  . JOINT REPLACEMENT     left knee replacement  . KIDNEY SURGERY Right     kidney stones  . LITHOTRIPSY    . NM [facility] HX)  February 2017   Likely breast attenuation. LOW RISK study. Normal EF 55-60%.  . OTHER SURGICAL HISTORY     08/2016 or 09/2016 surgery on achilles tenso h/o staph infection heal. Dr. [name]  . removal of left hematoma Left    leg  . REVERSE SHOULDER ARTHROPLASTY Right 10/08/2015   Procedure: REVERSE SHOULDER ARTHROPLASTY;  Surgeon: [name], MD;  Location: [facility] ORS;  Service: Orthopedics;  Laterality: Right;  . SLEEVE GASTROPLASTY    . TONSILLECTOMY    . TOTAL KNEE ARTHROPLASTY    . TRANSESOPHAGEAL ECHOCARDIOGRAM  08/08/2013   Mild LVH, EF 60-65%. Moderate LA dilation and mild RA dilation. Mild MR with no evidence of stenosis and no evidence of endocarditis. A false chordae is noted.  . TRANSTHORACIC ECHOCARDIOGRAM  08/03/2013   Mild-Moderate concentric LVH, EF 60-65%. Normal diastolic function. Mild LA dilation. Mild MR with possible vegetation  - not confirmed on TEE   . ULNAR NERVE TRANSPOSITION Right 08/06/2016   Procedure: ULNAR NERVE DECOMPRESSION/TRANSPOSITION;  Surgeon: [name], MD;  Location: [facility] ORS;  Service: Orthopedics;  Laterality: Right;  . UPPER GI ENDOSCOPY    . VAGINAL HYSTERECTOMY      There were no vitals filed for this visit.    Subjective Assessment - 05/13/17 1309    Subjective  Patient presents with husband but without walker. Ulcer on bottom of foot still not healed but has made progress. Patient reports not using walker at home. Pain in LLE greatly improved since last session with no pain at rest; RLE continues to bother patient. Ulcer is draining requiring additional changes of bandages but patient is optimistic that it will soon be resolved.    Patient is accompained by:  Family member    Pertinent History  Patient presents with hx of DM, PVD, peripheral neuropathy. In June 2018 patient suffered staph infection and subsequent necrosis of L Achilles tendon requiring partial resection. Patient  has suffered three falls in last six months, including one where she felt a "pop" in the hip while getting out of the bathtub. Went to [facility] two days later and received x-ray, negative; diagnosed with muscle strain and was recommended exercises. ER visit on 1/28 following third fall onto L hip, diagnosed with hematoma. Patient reports reduced sensation in feet L > R. Requires assistance to get into and out of bed since tendon surgery. Patient uses spc for ambulation and reports that small steps do not cause pain.    Limitations  Walking;Sitting;Standing;Lifting;House hold activities    How long can you sit comfortably?  1 hr    How long can you stand comfortably?  n/a  How long can you walk comfortably?  40 min    Diagnostic tests  x-ray, negative, CT scan showed torn muscle    Patient Stated Goals  Improve balance, reduce fall risk, and can go somewhere without FOF    Currently in Pain?  Yes    Pain Score  2     Pain Location  Hip    Pain Orientation  Right;Posterior    Pain Descriptors / Indicators  Aching    Pain Type  Acute pain    Pain Onset  1 to 4 weeks ago    Pain Frequency  Intermittent    Aggravating Factors   STS or stepping up with RLE    Pain Relieving Factors  rest    Multiple Pain Sites  No    Pain Onset  1 to 4 weeks ago      TREATMENT  Transfers/Bed Mobility:  Patient demonstrates safe transfer from standing with walker > sitting EOB.  Patient demonstrated difficulty with transitioning sitting EOB > sidelying R requiring min assist with RLE; able to transfer sidelying R to supine and scoot independently and without pain.  LLE presents with reduced pain allowing assessment: Hip AROM WFL for abduction, adduction; limited in flexion by pain.  Initiated supine strengthening for LLE:  Supine, RLE hook-lying: LLE hip abduction 2 x 10 on slide board to minimize friction; patient demonstrates compensatory external rotation of LLE but able to correct with cues LLE hip  adduction 2 x 10 on slide board to minimize friction LLE SAQ over bolster 2 x 10 LLE heel slides 2 x 10 BLE marches 2 x 10 in reduced (pain-free) ROM  Patient educated on bed mobility to transfer from supine to R sidelying; required min assist to roll hips into sidelying position. Able to transfer R sidelying > sitting EOB independently with difficulty. Able to transfer from sitting EOB > standing with walker with supervision; therapist cues to pull walker close to bed. Patient demonstrated good understanding but may require additional repetition to master.  Family member (husband) educated on assisting with transfers (not pulling on arm, supporting at hips and shoulders).  Gait assessment: Patient ambulated 100 ft requiring mod cues to correct BLE external rotation L>R.  HEP advanced with supine LLE strengthening exercises: hip adduction, abduction, heel slides, and marches.  Patient tolerated treatment well reporting no increased pain.    PT Education - 05/13/17 1259    Education provided  Yes    Education Details  Bed mobility; strengthening; HEP    Person(s) Educated  Patient;Spouse    Methods  Explanation;Demonstration;Verbal cues;Handout    Comprehension  Verbalized understanding;Returned demonstration;Verbal cues required       PT Short Term Goals - 04/26/17 1502      PT SHORT TERM GOAL #1   Title  Patient will be independent in home exercise program to improve strength/mobility for better functional independence with bed mobility and ADLs.    Baseline  No HEP    Time  4    Period  Weeks    Status  New    Target Date  05/24/17      PT SHORT TERM GOAL #2   Title  Patient will report a worst pain score of 3/10 VAS in the L hip to improve tolerance with ADLs and reduce symptoms with bed mobility activities.      Baseline  6/10 with medication    Time  4    Period  Weeks    Status  New    Target Date  05/24/17      PT SHORT TERM GOAL #3   Title  Patient will  demonstrate sit to supine bed mobility, supine to sit bed mobility, and rolling L and R with minimal assistance for improved functional mobility and independence    Baseline  Patient requires mod-max A for bed mobility    Time  4    Period  Weeks    Status  New    Target Date  05/24/17        PT Long Term Goals - 04/26/17 1504      PT LONG TERM GOAL #1   Title  Patient (>41 years old) will improve 10-meter walk test to 0.6 meters/sec with least restrictive AD to demonstrate reduced fall risk and capacity for limited community ambulation.    Baseline  0.21 meters/sec    Time  8    Period  Weeks    Status  New    Target Date  06/21/17      PT LONG TERM GOAL #2   Title  Patient will improve LEFS score to 40/80 to demonstrate improved functional mobility and independence with ADLs    Baseline  9/80    Time  8    Period  Weeks    Status  New    Target Date  06/21/17      PT LONG TERM GOAL #3   Title  Patient will demonstrate ability to perform bed mobility independently for improved functional mobility and independence.    Baseline  Patient requires mod-max A for bed mobility    Time  8    Period  Weeks    Status  New    Target Date  06/21/17      PT LONG TERM GOAL #4   Title  --    Baseline  --    Time  --    Period  --    Status  --    Target Date  --         Plan - 05/13/17 1551    Clinical Impression Statement  Patient presents with husband. Reports that LLE hip no longer painful at rest; ulcer on LLE plantar surface of foot is continuing to heal well. LLE presents with greatly reduced pain allowing assessment: hip AROM in abduction and adduction is WNL, though flexion continues to be limited by pain to ~45 deg. Patient tolerated supine hip and knee exercises well reporting only mild discomfort; advanced HEP with supine hip strengthening exercises; patient provided with handout. Patient able to transfer and perform bed mobility I or with min A demonstrating improved  functional mobility and suggesting reduced caregiver burden; however, patient remains reluctant to use walker at home. Therapist reaffirmed importance of using walker to reduce fall risk. Patient demonstrated ability to ambulate safely with walker with min cues to correct external rotation of BLE. Husband educated on improved body mechanics for assistance and demonstrated good comprehension. Patient will continue to benefit from skilled physical therapy to improve LLE strength, balance, and functional mobility.    Rehab Potential  Fair    Clinical Impairments Affecting Rehab Potential  - comorbidities, age, severity / + family support, acute injury    PT Frequency  2x / week    PT Duration  8 weeks    PT Treatment/Interventions  Aquatic Therapy;ADLs/Self Care Home Management;Cryotherapy;Electrical Stimulation;Moist Heat;Ultrasound;DME Instruction;Gait training;Stair training;Functional mobility training;Therapeutic activities;Therapeutic exercise;Balance training;Neuromuscular re-education;Patient/family education;Manual techniques;Compression bandaging;Scar mobilization;Passive range of  motion;Dry needling;Energy conservation;Taping    PT Next Visit Plan  LE strengthening    Consulted and Agree with Plan of Care  Patient       Patient will benefit from skilled therapeutic intervention in order to improve the following deficits and impairments:  Abnormal gait, Decreased activity tolerance, Decreased balance, Decreased endurance, Decreased coordination, Decreased mobility, Decreased range of motion, Decreased safety awareness, Difficulty walking, Decreased strength, Hypomobility, Impaired perceived functional ability, Impaired UE functional use, Obesity, Impaired flexibility, Pain  Visit Diagnosis: Pain in left hip  Difficulty in walking, not elsewhere classified  Muscle weakness (generalized)     Problem List Patient Active Problem List   Diagnosis Date Noted  . Pain of left hip joint  04/22/2017  . Abnormality of gait and mobility 04/22/2017  . Fall with injury 02/02/2017  . Anemia 11/18/2016  . Asthma 11/18/2016  . Carpal tunnel syndrome 11/18/2016  . Pain in joint involving ankle and foot 11/18/2016  . Obstructive sleep apnea of adult 11/18/2016  . Spinal stenosis of thoracic region 11/18/2016  . Abscess of tendon of left foot 09/21/2016  . Cubital tunnel syndrome on right 08/07/2016  . Chronic pain syndrome 02/25/2016  . Long term current use of opiate analgesic 02/25/2016  . Chronic right shoulder pain 02/25/2016  . Status post reverse total shoulder replacement 10/08/2015  . 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 Morbid obesity with BMI of 40.0-44.9, adult ([place]) 09/23/2015  . Chronic prescription benzodiazepine use 09/23/2015  . Osteoarthritis, multiple sites 09/23/2015  . Other shoulder lesions (Right) 08/28/2015  . Cervical radiculitis 08/06/2015  . Opiate use (15 MME/Day) 01/16/2015  . Impingement syndrome of shoulder region 01/07/2015  . Mixed incontinence 11/26/2014  . Atrophic vaginitis 11/26/2014  . Anxiety and depression 09/21/2014  . Bladder cystocele 09/21/2014  . Gastro-esophageal reflux disease without esophagitis 09/21/2014  . DM (diabetes mellitus) type II, controlled, with peripheral vascular disorder ([place]) 08/31/2014  . Diabetic peripheral neuropathy associated with type 2 diabetes mellitus ([place]) 08/31/2014  . Asthma, moderate persistent, well-controlled 08/31/2014  . Hypothyroidism, adult 08/31/2014  . Peripheral vascular disease due to secondary diabetes mellitus ([place]) 12/11/2013  . Apnea, sleep 10/19/2013  . Paroxysmal atrial fibrillation ([place]); CHA2DS2-Vasc Score 5. On Pradaxa 07/27/2013  . Essential hypertension 07/27/2013   Virgia Land, SPT 05/13/2017, 4:38 PM  This entire session was performed under direct supervision and direction of a licensed therapist/therapist assistant . I have personally read,  edited and approve of the note as written. [place], PT, DPT [facility] MAIN [facility] SERVICES [address] [place], [place], [address] Phone: [phone]   Fax:  [phone]  Name: [name] MRN: [id number] Date of Birth: [birth year]

## 2017-05-13 NOTE — Patient Instructions (Addendum)
Follow up in 6 weeks  Drink more water  Take Metoprolol 25 mg 2x per day Norvasc 5 mg in am  Quinapril 40 mg daily  All above for blood pressure    Tiotropium respiratory inhalation spray (Spiriva Respimat) What is this medicine? TIOTROPIUM (tee oh TRO pee um) is a bronchodilator. It helps open up the airways in your lungs to make it easier to breathe. This medicine is used to treat chronic obstructive pulmonary disease (COPD), including emphysema and chronic bronchitis. It is also used to treat asthma. Do not use this medicine for an acute attack. This medicine may be used for other purposes; ask your health care provider or pharmacist if you have questions. COMMON BRAND NAME(S): Spiriva Respimat What should I tell my health care provider before I take this medicine? They need to know if you have any of these conditions: -bladder problems or difficulty passing urine -glaucoma -kidney disease -prostate disease -an unusual or allergic reaction to tiotropium, ipratropium, atropine, other medicines, foods, dyes, or preservatives -pregnant or trying to get pregnant -breast-feeding How should I use this medicine? This medicine is inhaled through the mouth. It is used once per day. Follow the directions on the prescription label. Take your medicine at regular intervals. Do not take your medicine more often than directed. Do not stop taking except on your doctor's advice. Make sure that you are using your inhaler correctly. Ask you doctor or health care provider if you have any questions. Talk to your pediatrician regarding the use of this medicine in children. While this drug may be prescribed for children as young as 6 years for selected conditions, precautions do apply. Overdosage: If you think you have taken too much of this medicine contact a poison control center or emergency room at once. NOTE: This medicine is only for you. Do not share this medicine with others. What if I miss a  dose? If you miss a dose, use it as soon as you can. If it is almost time for your next dose, use only that dose and continue with your regular schedule. Do not use double or extra doses. What may interact with this medicine? This medicine may also interact with the following medications: -atropine -antihistamines for allergy, cough and cold -certain medicines for bladder problems like oxybutynin, tolterodine -certain medicines for stomach problems like dicyclomine, hyoscyamine -certain medicines for travel sickness like scopolamine -certain medicines for Parkinson's disease like benztropine, trihexyphenidyl -ipratropium This list may not describe all possible interactions. Give your health care provider a list of all the medicines, herbs, non-prescription drugs, or dietary supplements you use. Also tell them if you smoke, drink alcohol, or use illegal drugs. Some items may interact with your medicine. What should I watch for while using this medicine? Visit your doctor or health care professional for regular checks on your progress. Tell your doctor if your symptoms do not improve. Do not use more medicine than directed. If your symptoms get worse while you are using this medicine, call your doctor right away. Do not get the this medicine in your eyes. It can cause irritation, pain, or blurred vision. You may get dizzy. Do not drive, use machinery, or do anything that needs mental alertness until you know how this medicine affects you. Do not stand or sit up quickly, especially if you are an older patient. This reduces the risk of dizzy or fainting spells. Clean the inhaler as directed in the patient information sheet that comes with this medicine. What  side effects may I notice from receiving this medicine? Side effects that you should report to your doctor or health care professional as soon as possible: -allergic reactions like skin rash or hives, swelling of the face, lips, or  tongue -breathing problems right after inhaling your medicine -changes in vision -chest pain -difficulty breathing or wheezing that increases or does not go away -fast, irregular heartbeat -general ill feeling or flu-like symptoms -stomach pain -trouble passing urine or change in the amount of urine Side effects that usually do not require medical attention (report to your doctor or health care professional if they continue or are bothersome): -constipation -cough -dizziness -dry mouth -sore throat This list may not describe all possible side effects. Call your doctor for medical advice about side effects. You may report side effects to FDA at 1-800-FDA-1088. Where should I keep my medicine? Keep out of the reach of children. Store at a room temperature between 15 and 30 degrees C (59 and 86 degrees F). Do not freeze. The inhaler should be discarded after 3 months or when the inhaler becomes locked, whichever comes first. Throw away any unopened packages after the expiration date. NOTE: This sheet is a summary. It may not cover all possible information. If you have questions about this medicine, talk to your doctor, pharmacist, or health care provider.  2018 Elsevier/Gold Standard (2015-05-14 11:34:37)   Hypertension Hypertension, commonly called high blood pressure, is when the force of blood pumping through the arteries is too strong. The arteries are the blood vessels that carry blood from the heart throughout the body. Hypertension forces the heart to work harder to pump blood and may cause arteries to become narrow or stiff. Having untreated or uncontrolled hypertension can cause heart attacks, strokes, kidney disease, and other problems. A blood pressure reading consists of a higher number over a lower number. Ideally, your blood pressure should be below 120/80. The first ("top") number is called the systolic pressure. It is a measure of the pressure in your arteries as your heart  beats. The second ("bottom") number is called the diastolic pressure. It is a measure of the pressure in your arteries as the heart relaxes. What are the causes? The cause of this condition is not known. What increases the risk? Some risk factors for high blood pressure are under your control. Others are not. Factors you can change  Smoking.  Having type 2 diabetes mellitus, high cholesterol, or both.  Not getting enough exercise or physical activity.  Being overweight.  Having too much fat, sugar, calories, or salt (sodium) in your diet.  Drinking too much alcohol. Factors that are difficult or impossible to change  Having chronic kidney disease.  Having a family history of high blood pressure.  Age. Risk increases with age.  Race. You may be at higher risk if you are African-American.  Gender. Men are at higher risk than women before age 85. After age 42, women are at higher risk than men.  Having obstructive sleep apnea.  Stress. What are the signs or symptoms? Extremely high blood pressure (hypertensive crisis) may cause:  Headache.  Anxiety.  Shortness of breath.  Nosebleed.  Nausea and vomiting.  Severe chest pain.  Jerky movements you cannot control (seizures).  How is this diagnosed? This condition is diagnosed by measuring your blood pressure while you are seated, with your arm resting on a surface. The cuff of the blood pressure monitor will be placed directly against the skin of your upper arm  at the level of your heart. It should be measured at least twice using the same arm. Certain conditions can cause a difference in blood pressure between your right and left arms. Certain factors can cause blood pressure readings to be lower or higher than normal (elevated) for a short period of time:  When your blood pressure is higher when you are in a health care provider's office than when you are at home, this is called white coat hypertension. Most people  with this condition do not need medicines.  When your blood pressure is higher at home than when you are in a health care provider's office, this is called masked hypertension. Most people with this condition may need medicines to control blood pressure.  If you have a high blood pressure reading during one visit or you have normal blood pressure with other risk factors:  You may be asked to return on a different day to have your blood pressure checked again.  You may be asked to monitor your blood pressure at home for 1 week or longer.  If you are diagnosed with hypertension, you may have other blood or imaging tests to help your health care provider understand your overall risk for other conditions. How is this treated? This condition is treated by making healthy lifestyle changes, such as eating healthy foods, exercising more, and reducing your alcohol intake. Your health care provider may prescribe medicine if lifestyle changes are not enough to get your blood pressure under control, and if:  Your systolic blood pressure is above 130.  Your diastolic blood pressure is above 80.  Your personal target blood pressure may vary depending on your medical conditions, your age, and other factors. Follow these instructions at home: Eating and drinking  Eat a diet that is high in fiber and potassium, and low in sodium, added sugar, and fat. An example eating plan is called the DASH (Dietary Approaches to Stop Hypertension) diet. To eat this way: ? Eat plenty of fresh fruits and vegetables. Try to fill half of your plate at each meal with fruits and vegetables. ? Eat whole grains, such as whole wheat pasta, brown rice, or whole grain bread. Fill about one quarter of your plate with whole grains. ? Eat or drink low-fat dairy products, such as skim milk or low-fat yogurt. ? Avoid fatty cuts of meat, processed or cured meats, and poultry with skin. Fill about one quarter of your plate with lean  proteins, such as fish, chicken without skin, beans, eggs, and tofu. ? Avoid premade and processed foods. These tend to be higher in sodium, added sugar, and fat.  Reduce your daily sodium intake. Most people with hypertension should eat less than 1,500 mg of sodium a day.  Limit alcohol intake to no more than 1 drink a day for nonpregnant women and 2 drinks a day for men. One drink equals 12 oz of beer, 5 oz of wine, or 1 oz of hard liquor. Lifestyle  Work with your health care provider to maintain a healthy body weight or to lose weight. Ask what an ideal weight is for you.  Get at least 30 minutes of exercise that causes your heart to beat faster (aerobic exercise) most days of the week. Activities may include walking, swimming, or biking.  Include exercise to strengthen your muscles (resistance exercise), such as pilates or lifting weights, as part of your weekly exercise routine. Try to do these types of exercises for 30 minutes at least 3  days a week.  Do not use any products that contain nicotine or tobacco, such as cigarettes and e-cigarettes. If you need help quitting, ask your health care provider.  Monitor your blood pressure at home as told by your health care provider.  Keep all follow-up visits as told by your health care provider. This is important. Medicines  Take over-the-counter and prescription medicines only as told by your health care provider. Follow directions carefully. Blood pressure medicines must be taken as prescribed.  Do not skip doses of blood pressure medicine. Doing this puts you at risk for problems and can make the medicine less effective.  Ask your health care provider about side effects or reactions to medicines that you should watch for. Contact a health care provider if:  You think you are having a reaction to a medicine you are taking.  You have headaches that keep coming back (recurring).  You feel dizzy.  You have swelling in your  ankles.  You have trouble with your vision. Get help right away if:  You develop a severe headache or confusion.  You have unusual weakness or numbness.  You feel faint.  You have severe pain in your chest or abdomen.  You vomit repeatedly.  You have trouble breathing. Summary  Hypertension is when the force of blood pumping through your arteries is too strong. If this condition is not controlled, it may put you at risk for serious complications.  Your personal target blood pressure may vary depending on your medical conditions, your age, and other factors. For most people, a normal blood pressure is less than 120/80.  Hypertension is treated with lifestyle changes, medicines, or a combination of both. Lifestyle changes include weight loss, eating a healthy, low-sodium diet, exercising more, and limiting alcohol. This information is not intended to replace advice given to you by your health care provider. Make sure you discuss any questions you have with your health care provider. Document Released: 03/09/2005 Document Revised: 02/05/2016 Document Reviewed: 02/05/2016 Elsevier Interactive Patient Education  [employer].

## 2017-05-14 ENCOUNTER — Other Ambulatory Visit: Payer: Self-pay | Admitting: Internal Medicine

## 2017-05-14 ENCOUNTER — Telehealth: Payer: Self-pay | Admitting: Internal Medicine

## 2017-05-14 ENCOUNTER — Other Ambulatory Visit: Payer: Self-pay | Admitting: Family Medicine

## 2017-05-14 DIAGNOSIS — R269 Unspecified abnormalities of gait and mobility: Secondary | ICD-10-CM

## 2017-05-14 DIAGNOSIS — K219 Gastro-esophageal reflux disease without esophagitis: Secondary | ICD-10-CM

## 2017-05-14 MED ORDER — DEXLANSOPRAZOLE 60 MG PO CPDR: 1.0000 | DELAYED_RELEASE_CAPSULE | Freq: Every day | ORAL | 1 refills | Status: DC

## 2017-05-14 MED ORDER — DABIGATRAN ETEXILATE MESYLATE 150 MG PO CAPS: 150.0000 mg | ORAL_CAPSULE | Freq: Two times a day (BID) | ORAL | 0 refills | Status: DC

## 2017-05-14 NOTE — Telephone Encounter (Signed)
Pt states she has to get this rollin walker from a in network provider. [facility] Fax: [phone]  Pt states she needs the dr to fax this order to their office.     Pt states she fell again today and has decided she really needs this walker. Pt states she did not get hurt, hit her elbow. Will call back if she needs to see the dr next week.

## 2017-05-14 NOTE — Telephone Encounter (Signed)
Last OV 05/14/17 last filled by Dr.Sonnenberg 02/17/17 30 0rf

## 2017-05-14 NOTE — Telephone Encounter (Signed)
Copied from [place] [id number]. Topic: Inquiry >> May 14, 2017 10:58 AM Arletha Grippe wrote: Reason for CRM: pt has rx for a rolling walker with seat and brakes - in order for it to be covered under insurance, pt says she needs a code.  The letters DME 1039 are on the rx, pt wants to know if that is the code?

## 2017-05-16 ENCOUNTER — Encounter: Payer: Self-pay | Admitting: Internal Medicine

## 2017-05-16 DIAGNOSIS — E278 Other specified disorders of adrenal gland: Secondary | ICD-10-CM | POA: Insufficient documentation

## 2017-05-16 NOTE — Progress Notes (Signed)
Chief Complaint  Patient presents with  . Follow-up   Follow up  1. HTN uncontrolled on lopressor 25 mg bid, Quinalpril 40 mg qd of note imaging since 2014 at least with b/l adrenal masses c/w myelolipomas  2. Afib on Pradaxa started by former PCP needs refill she is establish with cardiology Dr. Fletcher Anon and prefer they manage anticoagulation will refill temp and defer further tx of Afrib to cards. Rate controlled today  3. Hypothyroidism uncontrolled on levo 150 advised not to take other medications/supplements with thyroid medications 4. Left hip better now having right buttock pain will have pt disc with ortho and also disc disc with ortho steroid inj to see if it will help. She reports gait is off since last fall and would like rolling walker with brakes and seat.  5. Asthma f/u with Dr. Raul Del and saw recently she is on Adviar 250/50, prn Albuterol and albuterol neb. Disc Spiriva today she could consider but she wants to wait 6. H/o fatty liver 08/2011 on [place] and needs hep B vaccine disc with pt Twinrix with h/o fatty liver and given Rx today to get at her pharmacy  7. She c/o urine odor w/o burning/frequency.   8. Reviewed labs and she has iron def anemia prev on ferrous sulfate + diarrhea will try ferrous gluconate    Review of Systems  Constitutional: Negative for weight loss.  HENT: Negative for hearing loss.   Eyes: Negative for blurred vision.  Respiratory: Positive for shortness of breath.   Cardiovascular: Negative for chest pain.  Gastrointestinal: Negative for abdominal pain.  Genitourinary:       +urine odor   Musculoskeletal: Positive for joint pain. Negative for falls.  Skin: Negative for rash.  Neurological: Negative for headaches.  Psychiatric/Behavioral: Negative for memory loss.   Past Medical History:  Diagnosis Date  . Abnormal antibody titer   . Anxiety and depression   . Arthritis   . Asthma   . Cystocele   . Depression   . Diabetes ([place])   . DVT (deep  venous thrombosis) (HCC)    Righ calf  . Dysrhythmia   . Edema   . GERD (gastroesophageal reflux disease)   . Heart murmur    Echocardiogram June 2015: Mild MR (possible vegetation seen on TTE, not seen on TEE), normal LV size with moderate concentric LVH. Normal function EF 60-65%. Normal diastolic function. Mild LA dilation.  Marland Kitchen History of IBS   . History of methicillin resistant staphylococcus aureus (MRSA) 2008  . Hyperlipidemia   . Hypertension   . Hypothyroidism   . Insomnia   . Kidney stone    kidney stones with lithotripsy  . Neuropathy involving both lower extremities   . Obesity, Class II, BMI 35-39.9, with comorbidity   . Paroxysmal atrial fibrillation ([place]) 2007   In June 2015, Cardiac Event Monitor: Mostly SR/sinus arrhythmia with PVCs that are frequent. Short bursts of A. fib lasting several minutes;; CHA2DS2-VASc Score = 5 (age, Female, PVD, DM, HTN)  . Peripheral vascular disease ([place])   . Sleep apnea    use C-PAP  . Urinary incontinence   . Venous stasis dermatitis of both lower extremities    Past Surgical History:  Procedure Laterality Date  . ANKLE SURGERY Right   . APPLICATION OF WOUND VAC Left 06/27/2015   Procedure: APPLICATION OF WOUND VAC ( POSSIBLE ) ;  Surgeon: [name], MD;  Location: [facility] ORS;  Service: Vascular;  Laterality: Left;  . APPLICATION  OF WOUND VAC Left 09/25/2016   Procedure: APPLICATION OF WOUND VAC;  Surgeon: [name], DPM;  Location: [facility] ORS;  Service: Podiatry;  Laterality: Left;  . arm surgery     right    . CHOLECYSTECTOMY    . COLONOSCOPY    . COLONOSCOPY WITH PROPOFOL N/A 01/11/2017   Procedure: COLONOSCOPY WITH PROPOFOL;  Surgeon: [name], MD;  Location: [facility] ENDOSCOPY;  Service: Endoscopy;  Laterality: N/A;  . COLONOSCOPY WITH PROPOFOL N/A 02/22/2017   Procedure: COLONOSCOPY WITH PROPOFOL;  Surgeon: [name], MD;  Location: [facility] ENDOSCOPY;  Service: Endoscopy;  Laterality: N/A;  .  ESOPHAGOGASTRODUODENOSCOPY (EGD) WITH PROPOFOL N/A 01/11/2017   Procedure: ESOPHAGOGASTRODUODENOSCOPY (EGD) WITH PROPOFOL;  Surgeon: [name], MD;  Location: [facility] ENDOSCOPY;  Service: Endoscopy;  Laterality: N/A;  . HERNIA REPAIR     umbilical  . HIATAL HERNIA REPAIR    . I&D EXTREMITY Left 06/27/2015   Procedure: IRRIGATION AND DEBRIDEMENT EXTREMITY            ( CALF HEMATOMA ) POSSIBLE WOUND VAC;  Surgeon: [name], MD;  Location: [facility] ORS;  Service: Vascular;  Laterality: Left;  . INCISION AND DRAINAGE OF WOUND Left 09/25/2016   Procedure: IRRIGATION AND DEBRIDEMENT - PARTIAL RESECTION OF ACHILLES TENDON WITH WOUND VAC APPLICATION;  Surgeon: [name], DPM;  Location: [facility] ORS;  Service: Podiatry;  Laterality: Left;  . IRRIGATION AND DEBRIDEMENT ABSCESS Left 09/07/2016   Procedure: IRRIGATION AND DEBRIDEMENT ABSCESS LEFT HEEL;  Surgeon: [name], DPM;  Location: [facility] ORS;  Service: Podiatry;  Laterality: Left;  . JOINT REPLACEMENT     left knee replacement  . KIDNEY SURGERY Right    kidney stones  . LITHOTRIPSY    . NM [facility] HX)  February 2017   Likely breast attenuation. LOW RISK study. Normal EF 55-60%.  . OTHER SURGICAL HISTORY     08/2016 or 09/2016 surgery on achilles tenso h/o staph infection heal. Dr. [name]  . removal of left hematoma Left    leg  . REVERSE SHOULDER ARTHROPLASTY Right 10/08/2015   Procedure: REVERSE SHOULDER ARTHROPLASTY;  Surgeon: [name], MD;  Location: [facility] ORS;  Service: Orthopedics;  Laterality: Right;  . SLEEVE GASTROPLASTY    . TONSILLECTOMY    . TOTAL KNEE ARTHROPLASTY    . TRANSESOPHAGEAL ECHOCARDIOGRAM  08/08/2013   Mild LVH, EF 60-65%. Moderate LA dilation and mild RA dilation. Mild MR with no evidence of stenosis and no evidence of endocarditis. A false chordae is noted.  . TRANSTHORACIC ECHOCARDIOGRAM  08/03/2013   Mild-Moderate concentric LVH, EF 60-65%. Normal diastolic function. Mild LA dilation. Mild MR  with possible vegetation  - not confirmed on TEE   . ULNAR NERVE TRANSPOSITION Right 08/06/2016   Procedure: ULNAR NERVE DECOMPRESSION/TRANSPOSITION;  Surgeon: [name], MD;  Location: [facility] ORS;  Service: Orthopedics;  Laterality: Right;  . UPPER GI ENDOSCOPY    . VAGINAL HYSTERECTOMY     Family History  Problem Relation Age of Onset  . Skin cancer Father   . Diabetes Father   . Hypertension Father   . Peripheral vascular disease Father   . Cancer Father   . Cerebral aneurysm Father   . Alcohol abuse Father   . Varicose Veins Mother   . Kidney disease Mother   . Arthritis Mother   . Mental illness Sister   . Cancer Maternal Aunt        breast  . Breast cancer  Maternal Aunt   . Arthritis Maternal Grandmother   . Hypertension Maternal Grandmother   . Diabetes Maternal Grandmother   . Arthritis Maternal Grandfather   . Heart disease Maternal Grandfather   . Hypertension Maternal Grandfather   . Arthritis Paternal Grandmother   . Hypertension Paternal Grandmother   . Diabetes Paternal Grandmother   . Arthritis Paternal Grandfather   . Hypertension Paternal Grandfather   . Bladder Cancer Neg Hx   . Kidney cancer Neg Hx    Social History   Socioeconomic History  . Marital status: Married    Spouse name: Not on file  . Number of children: Not on file  . Years of education: Not on file  . Highest education level: Not on file  Social Needs  . Financial resource strain: Not on file  . Food insecurity - worry: Not on file  . Food insecurity - inability: Not on file  . Transportation needs - medical: Not on file  . Transportation needs - non-medical: Not on file  Occupational History  . Not on file  Tobacco Use  . Smoking status: Never Smoker  . Smokeless tobacco: Never Used  Substance and Sexual Activity  . Alcohol use: No    Alcohol/week: 0.0 oz  . Drug use: No  . Sexual activity: No  Other Topics Concern  . Not on file  Social History Narrative   She is  currently married -- for 28 years. Does not work. Does not smoke or take alcohol. She never smoked. She exercises at least 3 days a week since before her gastric surgery.   Marital status reviewed in history of present illness.   Current Meds  Medication Sig  . albuterol (PROVENTIL HFA;VENTOLIN HFA) 108 (90 Base) MCG/ACT inhaler Inhale 1-2 puffs into the lungs every 4 (four) hours as needed for wheezing or shortness of breath. (Patient taking differently: Inhale 2 puffs into the lungs every 4 (four) hours as needed for wheezing or shortness of breath. )  . albuterol (PROVENTIL) (2.5 MG/3ML) 0.083% nebulizer solution Take 3 mLs (2.5 mg total) by nebulization every 6 (six) hours as needed for wheezing or shortness of breath.  . ALPRAZolam (XANAX) 0.5 MG tablet TAKE 1 TABLET BY MOUTH TWICE DAILY AS NEEDED  . buPROPion (WELLBUTRIN XL) 300 MG 24 hr tablet TAKE ONE TABLET BY MOUTH DAILY  . calcium carbonate (OSCAL) 1500 (600 Ca) MG TABS tablet Take 600 mg of elemental calcium by mouth 2 (two) times daily with a meal.  . cyanocobalamin 500 MCG tablet Take 500 mcg by mouth daily.  . dabigatran (PRADAXA) 150 MG CAPS capsule Take 1 capsule (150 mg total) by mouth 2 (two) times daily.  . DULoxetine (CYMBALTA) 60 MG capsule Take 1 capsule (60 mg total) by mouth daily.  Marland Kitchen estradiol (ESTRACE) 0.1 MG/GM vaginal cream Place 1 Applicatorful vaginally 3 (three) times a week.  . Fluticasone-Salmeterol (ADVAIR) 250-50 MCG/DOSE AEPB Inhale 1 puff into the lungs 2 (two) times daily.  Marland Kitchen gabapentin (NEURONTIN) 800 MG tablet Take 1 tablet (800 mg total) by mouth 4 (four) times daily.  Marland Kitchen levocetirizine (XYZAL) 5 MG tablet Take 1 tablet (5 mg total) by mouth every evening.  Marland Kitchen levothyroxine (SYNTHROID, LEVOTHROID) 175 MCG tablet Take 1 tablet (175 mcg total) by mouth daily before breakfast.  . meloxicam (MOBIC) 15 MG tablet Take 15 mg by mouth daily.   . metFORMIN (GLUCOPHAGE) 1000 MG tablet TAKE ONE TABLET BY MOUTH TWICE  DAILY (Patient taking differently: TAKE ONE  TABLET IN THE MORNING AND HALF TABLET AT NIGHT)  . metFORMIN (GLUCOPHAGE) 1000 MG tablet Take 1 tablet in the am and 1/2 tablet in the pm with food  . metoprolol tartrate (LOPRESSOR) 25 MG tablet TAKE 1 TABLET BY MOUTH TWICE DAILY  . mirabegron ER (MYRBETRIQ) 25 MG TB24 tablet Take 1 tablet (25 mg total) by mouth daily.  . montelukast (SINGULAIR) 10 MG tablet TAKE ONE TABLET BY MOUTH EVERY DAY  . Multiple Vitamin (MULTIVITAMIN WITH MINERALS) TABS tablet Take 1 tablet by mouth daily.  . ondansetron (ZOFRAN) 4 MG tablet Take 1 tablet (4 mg total) every 8 (eight) hours as needed by mouth for nausea or vomiting.  Marland Kitchen oxybutynin (DITROPAN XL) 15 MG 24 hr tablet Take 1 tablet (15 mg total) by mouth at bedtime.  Marland Kitchen oxyCODONE-acetaminophen (PERCOCET) 5-325 MG tablet Take 1 tablet by mouth every 6 (six) hours as needed for severe pain.  Marland Kitchen oxyCODONE-acetaminophen (PERCOCET/ROXICET) 5-325 MG tablet Take 1 tablet by mouth every 6 (six) hours as needed for severe pain.  Marland Kitchen quinapril (ACCUPRIL) 40 MG tablet Take 1 tablet (40 mg total) by mouth daily. MUST KEEP APPT THIS TIME FOR ADDITIONAL REFILLS  . sulfamethoxazole-trimethoprim (BACTRIM,SEPTRA) 400-80 MG tablet Take 1 tablet by mouth 2 (two) times daily.  Marland Kitchen triamcinolone cream (KENALOG) 0.1 % Apply 1 application 2 (two) times daily as needed topically.  . [DISCONTINUED] dabigatran (PRADAXA) 150 MG CAPS capsule TAKE 1 CAPSULE BY MOUTH TWICE DAILY  . [DISCONTINUED] dexlansoprazole (DEXILANT) 60 MG capsule Take 1 capsule (60 mg total) by mouth daily. MUST KEEP NEXT APPT FOR 90 DAY SUPPLY  . [DISCONTINUED] levothyroxine (SYNTHROID, LEVOTHROID) 150 MCG tablet TAKE 1 TABLET EVERY DAY ON EMPTY STOMACHWITH A GLASS OF WATER AT LEAST 30-60 MINBEFORE BREAKFAST   Allergies  Allergen Reactions  . Penicillins Hives, Shortness Of Breath and Swelling    Facial swelling Has patient had a PCN reaction causing immediate rash,  facial/tongue/throat swelling, SOB or lightheadedness with hypotension: Yes Has patient had a PCN reaction causing severe rash involving mucus membranes or skin necrosis: Yes Has patient had a PCN reaction that required hospitalization No Has patient had a PCN reaction occurring within the last 10 years: No If all of the above answers are "NO", then may proceed with Cephalosporin use.   . Aspirin Hives  . Morphine And Related Other (See Comments)    Patient becomes very confused  . Terramycin [Oxytetracycline] Hives   Recent Results (from the past 2160 hour(s))  Glucose, capillary     Status: Abnormal   Collection Time: 02/22/17  3:37 PM  Result Value Ref Range   Glucose-Capillary 111 (H) 65 - 99 mg/dL  Hepatitis C antibody     Status: None   Collection Time: 05/10/17  9:25 AM  Result Value Ref Range   Hepatitis C Ab NON-REACTIVE NON-REACTI   SIGNAL TO CUT-OFF 0.04 <1.00    Comment: . HCV antibody was non-reactive. There is no laboratory  evidence of HCV infection. . In most cases, no further action is required. However, if recent HCV exposure is suspected, a test for HCV RNA (test code [phone]) is suggested. . For additional information please refer to http://education.[facility].com/faq/FAQ22v1 (This link is being provided for informational/ educational purposes only.) .   Hepatitis B surface antigen     Status: None   Collection Time: 05/10/17  9:25 AM  Result Value Ref Range   Hepatitis B Surface Ag NON-REACTIVE NON-REACTI  Hepatitis B surface antibody  Status: Abnormal   Collection Time: 05/10/17  9:25 AM  Result Value Ref Range   Hepatitis B-Post <5 (L) > OR = 10 mIU/mL    Comment: . Patient does not have immunity to hepatitis B virus. . For additional information, please refer to http://education.[facility].com/faq/FAQ105 (This link is being provided for informational/ educational purposes only).   B12     Status: None   Collection Time:  05/10/17  9:25 AM  Result Value Ref Range   Vitamin B-12 502 211 - 911 pg/mL  Vitamin D (25 hydroxy)     Status: None   Collection Time: 05/10/17  9:25 AM  Result Value Ref Range   VITD 32.15 30.00 - 100.00 ng/mL  Urine Microalbumin w/creat. ratio     Status: Abnormal   Collection Time: 05/10/17  9:25 AM  Result Value Ref Range   Microalb, Ur 2.7 (H) 0.0 - 1.9 mg/dL   Creatinine,U 158.8 mg/dL   Microalb Creat Ratio 1.7 0.0 - 30.0 mg/g  TSH     Status: Abnormal   Collection Time: 05/10/17  9:25 AM  Result Value Ref Range   TSH 6.17 (H) 0.35 - 4.50 uIU/mL  Lipid panel     Status: None   Collection Time: 05/10/17  9:25 AM  Result Value Ref Range   Cholesterol 149 0 - 200 mg/dL    Comment: ATP III Classification       Desirable:  < 200 mg/dL               Borderline High:  200 - 239 mg/dL          High:  > = 240 mg/dL   Triglycerides 81.0 0.0 - 149.0 mg/dL    Comment: Normal:  <150 mg/dLBorderline High:  150 - 199 mg/dL   HDL 65.50 >39.00 mg/dL   VLDL 16.2 0.0 - 40.0 mg/dL   LDL Cholesterol 67 0 - 99 mg/dL   Total CHOL/HDL Ratio 2     Comment:                Men          Women1/2 Average Risk     3.4          3.3Average Risk          5.0          4.42X Average Risk          9.6          7.13X Average Risk          15.0          11.0                       NonHDL 83.24     Comment: NOTE:  Non-HDL goal should be 30 mg/dL higher than patient's LDL goal (i.e. LDL goal of < 70 mg/dL, would have non-HDL goal of < 100 mg/dL)  Hemoglobin A1c     Status: None   Collection Time: 05/10/17  9:25 AM  Result Value Ref Range   Hgb A1c MFr Bld 6.4 4.6 - 6.5 %    Comment: Glycemic Control Guidelines for People with Diabetes:Non Diabetic:  <6%Goal of Therapy: <7%Additional Action Suggested:  >8%   Urinalysis, Routine w reflex microscopic     Status: Abnormal   Collection Time: 05/10/17  9:25 AM  Result Value Ref Range   Color, Urine YELLOW Yellow;Lt. Yellow   APPearance Sl Cloudy (A) Clear  Specific Gravity, Urine >=1.030 (A) 1.000 - 1.030   pH 5.5 5.0 - 8.0   Total Protein, Urine NEGATIVE Negative   Urine Glucose NEGATIVE Negative   Ketones, ur TRACE (A) Negative   Bilirubin Urine NEGATIVE Negative   Hgb urine dipstick NEGATIVE Negative   Urobilinogen, UA 0.2 0.0 - 1.0   Leukocytes, UA SMALL (A) Negative   Nitrite NEGATIVE Negative   WBC, UA 11-20/hpf (A) 0-2/hpf   RBC / HPF none seen 0-2/hpf   Mucus, UA Presence of (A) None   Squamous Epithelial / LPF Few(5-10/hpf) (A) Rare(0-4/hpf)  CBC with Differential/Platelet     Status: Abnormal   Collection Time: 05/10/17  9:25 AM  Result Value Ref Range   WBC 5.6 4.0 - 10.5 K/uL   RBC 3.98 3.87 - 5.11 Mil/uL   Hemoglobin 10.9 (L) 12.0 - 15.0 g/dL   HCT 33.9 (L) 36.0 - 46.0 %   MCV 85.0 78.0 - 100.0 fl   MCHC 32.3 30.0 - 36.0 g/dL   RDW 15.8 (H) 11.5 - 15.5 %   Platelets 271.0 150.0 - 400.0 K/uL   Neutrophils Relative % 62.6 43.0 - 77.0 %   Lymphocytes Relative 19.4 12.0 - 46.0 %   Monocytes Relative 9.2 3.0 - 12.0 %   Eosinophils Relative 7.3 (H) 0.0 - 5.0 %   Basophils Relative 1.5 0.0 - 3.0 %   Neutro Abs 3.5 1.4 - 7.7 K/uL   Lymphs Abs 1.1 0.7 - 4.0 K/uL   Monocytes Absolute 0.5 0.1 - 1.0 K/uL   Eosinophils Absolute 0.4 0.0 - 0.7 K/uL   Basophils Absolute 0.1 0.0 - 0.1 K/uL  Comprehensive metabolic panel     Status: Abnormal   Collection Time: 05/10/17  9:25 AM  Result Value Ref Range   Sodium 141 135 - 145 mEq/L   Potassium 4.7 3.5 - 5.1 mEq/L   Chloride 107 96 - 112 mEq/L   CO2 27 19 - 32 mEq/L   Glucose, Bld 108 (H) 70 - 99 mg/dL   BUN 29 (H) 6 - 23 mg/dL   Creatinine, Ser 0.89 0.40 - 1.20 mg/dL   Total Bilirubin 0.4 0.2 - 1.2 mg/dL   Alkaline Phosphatase 64 39 - 117 U/L   AST 12 0 - 37 U/L   ALT 9 0 - 35 U/L   Total Protein 6.6 6.0 - 8.3 g/dL   Albumin 3.9 3.5 - 5.2 g/dL   Calcium 9.0 8.4 - 10.5 mg/dL   GFR 67.10 >60.00 mL/min   Objective  Body mass index is 42.25 kg/m. Wt Readings from Last 3  Encounters:  05/13/17 257 lb 12.8 oz (116.9 kg)  04/20/17 262 lb 3.2 oz (118.9 kg)  04/19/17 262 lb (118.8 kg)   Temp Readings from Last 3 Encounters:  05/13/17 98.1 F (36.7 C) (Oral)  04/20/17 98 F (36.7 C) (Oral)  04/19/17 97.6 F (36.4 C) (Oral)   BP Readings from Last 3 Encounters:  05/13/17 (!) 160/88  05/06/17 (!) 184/86  04/20/17 (!) 158/80   Pulse Readings from Last 3 Encounters:  05/13/17 87  05/06/17 (!) 46  04/20/17 85   O2 sat room air 90%  Physical Exam  Constitutional: She is oriented to person, place, and time and well-developed, well-nourished, and in no distress.  HENT:  Head: Normocephalic and atraumatic.  Mouth/Throat: Oropharynx is clear and moist.  Eyes: Conjunctivae are normal. Pupils are equal, round, and reactive to light.  Cardiovascular: Normal rate, regular rhythm and normal heart sounds.  Pulmonary/Chest: Effort normal and breath sounds normal.  Neurological: She is alert and oriented to person, place, and time.  Skin: Skin is warm and dry.     Psychiatric: Mood, memory, affect and judgment normal.  Nursing note and vitals reviewed.   Assessment   1. HTN uncontrolled of note adrenal masses since 2014 c/w myelolipoma b/l  2. Afib on Pradaxa  3. Hypothyroidism uncontrolled  4. Left hip pain now with right buttock pain  5. Asthma with c/o sob with exertion 6. H/o fatty liver  7. Iron def anemia  8. HM  Plan  1.  Cont Lopressor 25 bid, quinapril 40 mg qd, add norvasc 5 mg qd  Consider repeat adrenal imaging last done 2016  Will ask pt at f/u about use of CPAP if she is using CPAP or h/o OSA sleep studies in care everywhere last 01/2016 unable to see report  2. Refill pradaxa she is est with Dr. Fletcher Anon will ask cardiology take over anticoagulation management, Afib management and treatment if needed cont pradaxa if different agent preferred change to another anticoagulant prior PCP was refilling pradaxa but this is more appropriate for  cards to be managing last saw 12/2016 needs to sch f/u w/in next 3 months  3. Increase levo from 150 to 175 and repeat TSHin 6 weeks  4. Disc with ortho steroid inj  Rx rolling walker with seat and brakes today  5.  Disc spririva today could add on to advair and albuterol pt wants to wait for now  6. Given Rx Twinrix with h/o fatty liver can get at pharmacy  7.  Change ferrous sulfate  To ferrous gluconate hopefully will not have diarrhea side effect  8.  Had flu, pna 23, prevnar  Consider Tdap and shingrix in future  Given Rx twinrix today to get at pharmacy h/o fatty liver   DEXA 10/17/14 normal  Ask about pap at f/u if ever had abnormal  mammo neg 07/06/16 referred for another today  Colonoscopy h/o polyps 02/22/17 prep not good needs to f/u and resch with [facility] has appt 05/31/17    Spent > 30 minutes with pt and coordinating tx plan  Provider: Dr. Olivia Mackie [name]-Internal Medicine

## 2017-05-17 DIAGNOSIS — E1143 Type 2 diabetes mellitus with diabetic autonomic (poly)neuropathy: Secondary | ICD-10-CM | POA: Diagnosis not present

## 2017-05-17 DIAGNOSIS — L97511 Non-pressure chronic ulcer of other part of right foot limited to breakdown of skin: Secondary | ICD-10-CM | POA: Diagnosis not present

## 2017-05-17 NOTE — Telephone Encounter (Signed)
Please fax order   Thanks [place]

## 2017-05-17 NOTE — Telephone Encounter (Signed)
Please advise 

## 2017-05-18 ENCOUNTER — Ambulatory Visit: Payer: PPO | Admitting: Physical Therapy

## 2017-05-18 ENCOUNTER — Encounter: Payer: PPO | Admitting: Nurse Practitioner

## 2017-05-18 NOTE — Telephone Encounter (Signed)
Order has been reordered and faxed.

## 2017-05-20 ENCOUNTER — Ambulatory Visit: Payer: PPO | Admitting: Physical Therapy

## 2017-05-24 ENCOUNTER — Ambulatory Visit: Payer: PPO | Attending: Nurse Practitioner | Admitting: Nurse Practitioner

## 2017-05-24 ENCOUNTER — Telehealth: Payer: Self-pay | Admitting: *Deleted

## 2017-05-24 ENCOUNTER — Ambulatory Visit
Admission: RE | Admit: 2017-05-24 | Discharge: 2017-05-24 | Disposition: A | Discharge status: Home / Self Care | Payer: PPO | Source: Ambulatory Visit | Attending: Nurse Practitioner | Admitting: Nurse Practitioner

## 2017-05-24 ENCOUNTER — Encounter: Payer: Self-pay | Admitting: Nurse Practitioner

## 2017-05-24 VITALS — BP 150/56 | HR 51 | Temp 98.1°F | Resp 16 | Ht 65.5 in | Wt 248.0 lb

## 2017-05-24 DIAGNOSIS — E1142 Type 2 diabetes mellitus with diabetic polyneuropathy: Secondary | ICD-10-CM

## 2017-05-24 DIAGNOSIS — G8929 Other chronic pain: Secondary | ICD-10-CM | POA: Insufficient documentation

## 2017-05-24 DIAGNOSIS — Z5181 Encounter for therapeutic drug level monitoring: Secondary | ICD-10-CM | POA: Diagnosis not present

## 2017-05-24 DIAGNOSIS — F209 Schizophrenia, unspecified: Secondary | ICD-10-CM | POA: Diagnosis not present

## 2017-05-24 DIAGNOSIS — M25551 Pain in right hip: Secondary | ICD-10-CM

## 2017-05-24 DIAGNOSIS — I480 Paroxysmal atrial fibrillation: Secondary | ICD-10-CM | POA: Diagnosis not present

## 2017-05-24 DIAGNOSIS — Z7984 Long term (current) use of oral hypoglycemic drugs: Secondary | ICD-10-CM | POA: Insufficient documentation

## 2017-05-24 DIAGNOSIS — G894 Chronic pain syndrome: Secondary | ICD-10-CM | POA: Diagnosis not present

## 2017-05-24 DIAGNOSIS — Z886 Allergy status to analgesic agent status: Secondary | ICD-10-CM | POA: Insufficient documentation

## 2017-05-24 DIAGNOSIS — K219 Gastro-esophageal reflux disease without esophagitis: Secondary | ICD-10-CM | POA: Insufficient documentation

## 2017-05-24 DIAGNOSIS — I10 Essential (primary) hypertension: Secondary | ICD-10-CM | POA: Diagnosis not present

## 2017-05-24 DIAGNOSIS — G4733 Obstructive sleep apnea (adult) (pediatric): Secondary | ICD-10-CM | POA: Insufficient documentation

## 2017-05-24 DIAGNOSIS — J45909 Unspecified asthma, uncomplicated: Secondary | ICD-10-CM | POA: Insufficient documentation

## 2017-05-24 DIAGNOSIS — Z885 Allergy status to narcotic agent status: Secondary | ICD-10-CM | POA: Diagnosis not present

## 2017-05-24 DIAGNOSIS — Z880 Allergy status to penicillin: Secondary | ICD-10-CM | POA: Diagnosis not present

## 2017-05-24 DIAGNOSIS — E785 Hyperlipidemia, unspecified: Secondary | ICD-10-CM | POA: Diagnosis not present

## 2017-05-24 DIAGNOSIS — M25552 Pain in left hip: Secondary | ICD-10-CM | POA: Insufficient documentation

## 2017-05-24 DIAGNOSIS — E1151 Type 2 diabetes mellitus with diabetic peripheral angiopathy without gangrene: Secondary | ICD-10-CM | POA: Insufficient documentation

## 2017-05-24 DIAGNOSIS — M4804 Spinal stenosis, thoracic region: Secondary | ICD-10-CM | POA: Diagnosis not present

## 2017-05-24 DIAGNOSIS — E039 Hypothyroidism, unspecified: Secondary | ICD-10-CM | POA: Insufficient documentation

## 2017-05-24 DIAGNOSIS — F418 Other specified anxiety disorders: Secondary | ICD-10-CM | POA: Diagnosis not present

## 2017-05-24 DIAGNOSIS — Z7901 Long term (current) use of anticoagulants: Secondary | ICD-10-CM | POA: Diagnosis not present

## 2017-05-24 DIAGNOSIS — M79672 Pain in left foot: Secondary | ICD-10-CM | POA: Insufficient documentation

## 2017-05-24 DIAGNOSIS — Z79891 Long term (current) use of opiate analgesic: Secondary | ICD-10-CM | POA: Insufficient documentation

## 2017-05-24 DIAGNOSIS — G5600 Carpal tunnel syndrome, unspecified upper limb: Secondary | ICD-10-CM | POA: Insufficient documentation

## 2017-05-24 DIAGNOSIS — Z79899 Other long term (current) drug therapy: Secondary | ICD-10-CM | POA: Diagnosis not present

## 2017-05-24 DIAGNOSIS — M47817 Spondylosis without myelopathy or radiculopathy, lumbosacral region: Secondary | ICD-10-CM | POA: Insufficient documentation

## 2017-05-24 DIAGNOSIS — F429 Obsessive-compulsive disorder, unspecified: Secondary | ICD-10-CM | POA: Diagnosis not present

## 2017-05-24 DIAGNOSIS — M533 Sacrococcygeal disorders, not elsewhere classified: Secondary | ICD-10-CM | POA: Diagnosis not present

## 2017-05-24 DIAGNOSIS — K6289 Other specified diseases of anus and rectum: Secondary | ICD-10-CM | POA: Insufficient documentation

## 2017-05-24 MED ORDER — HYDROCODONE-ACETAMINOPHEN 5-325 MG PO TABS: 1.0000 | ORAL_TABLET | Freq: Three times a day (TID) | ORAL | 0 refills | Status: DC | PRN

## 2017-05-24 MED ORDER — ORPHENADRINE CITRATE 30 MG/ML IJ SOLN
INTRAMUSCULAR | Status: AC
  Filled 2017-05-24: qty 2

## 2017-05-24 MED ORDER — DULOXETINE HCL 60 MG PO CPEP: 60.0000 mg | ORAL_CAPSULE | Freq: Every day | ORAL | 0 refills | Status: DC

## 2017-05-24 MED ORDER — GABAPENTIN 800 MG PO TABS: 800.0000 mg | ORAL_TABLET | Freq: Four times a day (QID) | ORAL | 0 refills | Status: DC

## 2017-05-24 MED ORDER — KETOROLAC TROMETHAMINE 60 MG/2ML IM SOLN
INTRAMUSCULAR | Status: AC
  Filled 2017-05-24: qty 2

## 2017-05-24 MED ORDER — KETOROLAC TROMETHAMINE 60 MG/2ML IM SOLN
60.0000 mg | Freq: Once | INTRAMUSCULAR | Status: AC
  Administered 2017-05-24: 60 mg via INTRAMUSCULAR

## 2017-05-24 MED ORDER — ORPHENADRINE CITRATE 30 MG/ML IJ SOLN
60.0000 mg | Freq: Once | INTRAMUSCULAR | Status: AC
  Administered 2017-05-24: 60 mg via INTRAMUSCULAR

## 2017-05-24 NOTE — Progress Notes (Signed)
Please make the patient aware there are no acute fractures  See if the injection (Toradol and Norflex) will be helpful for her pain may last from 3-7 days Continue with therapy.] Thanks

## 2017-05-24 NOTE — Progress Notes (Signed)
Results were reviewed and found to be: mildly abnormal  No acute injury or pathology identified  Review would suggest interventional pain management techniques may be of benefit 

## 2017-05-24 NOTE — Progress Notes (Signed)
\  [id number] Pain Medication Assessment:  Safety precautions to be maintained throughout the outpatient stay will include: orient to surroundings, keep bed in low position, maintain call bell within reach at all times, provide assistance with transfer out of bed and ambulation.  Medication Inspection Compliance: Pill count conducted under aseptic conditions, in front of the patient. Neither the pills nor the bottle was removed from the patient's sight at any time. Once count was completed pills were immediately returned to the patient in their original bottle.  Medication: Hydrocodone/APAP Pill/Patch Count: 85 of 90 pills remain Pill/Patch Appearance: Markings consistent with prescribed medication Bottle Appearance: Standard pharmacy container. Clearly labeled. Filled Date: 02 / 25 / 2019 Last Medication intake:  Today   Patient has an empty bottle of hydrocodone - apap 7.5-325 prescribed by Dr Jeneen Rinks McGhee/orthopedics for pain caused by a fall.  Qty 15 filled on 05/20/17 sig; 1 table q 8 hours.

## 2017-05-24 NOTE — Telephone Encounter (Signed)
Spoke with patient to let her know there no fx's noted via the xray of the hip today.  Patient reports relief from toradol/norflex injection she received here.  Patient will continue to monitor and let us know if she is not improving.

## 2017-05-24 NOTE — Progress Notes (Signed)
Patient's Name: [name]  MRN: [id number]  Referring Provider: Leone Haven, MD  DOB: May 29, 1950  PCP: [name], [name], MD  DOS: 05/24/2017  Note by: Vevelyn Francois NP  Service setting: Ambulatory outpatient  Specialty: Interventional Pain Management  Location: [facility] (AMB) Pain Management Facility    Patient type: Established    Primary Reason(s) for Visit: Encounter for prescription drug management. (Level of risk: moderate)  CC: Rectal Pain (right, bottom of panty line) and Foot Pain (left, wearing a boot. )  HPI  [name] is a 67 y.o. yearear old, female patient, who comes today for a medication management evaluation. She has Paroxysmal atrial fibrillation ([place]); CHA2DS2-Vasc Score 5. On Pradaxa; Essential hypertension; Peripheral vascular disease due to secondary diabetes mellitus ([place]); Apnea, sleep; DM (diabetes mellitus) type II, controlled, with peripheral vascular disorder ([place]); Diabetic peripheral neuropathy associated with type 2 diabetes mellitus ([place]); Asthma, moderate persistent, well-controlled; Hypothyroidism, adult; Anxiety and depression; Bladder cystocele; Gastro-esophageal reflux disease without esophagitis; Mixed incontinence; Atrophic vaginitis; Opiate use (15 MME/Day); Other shoulder lesions (Right); Neuropathy of right lateral femoral cutaneous nerve; Morbid obesity with BMI of 40.0-44.9, adult ([place]); Chronic prescription benzodiazepine use; Osteoarthritis, multiple sites; Status post reverse total shoulder replacement; Chronic pain syndrome; Long term current use of opiate analgesic; Chronic right shoulder pain; Abscess of tendon of left foot; Anemia; Asthma; Carpal tunnel syndrome; Cervical radiculitis; Cubital tunnel syndrome on right; Impingement syndrome of shoulder region; Pain in joint involving ankle and foot; Obstructive sleep apnea of adult; Spinal stenosis of thoracic region; Fall with injury; Pain of left hip joint; Abnormality of gait and mobility;  Mass of both adrenal glands ([place]); and Fatty liver on their problem list. Her primarily concern today is the Rectal Pain (right, bottom of panty line) and Foot Pain (left, wearing a boot. )  Pain Assessment: Location: Right Buttocks Radiating: into right leg Onset: More than a month ago Duration: Chronic pain Quality: Discomfort, Constant, Other (Comment)(weakness ) Severity: 9 /10 (self-reported pain score)  Note: Reported level is compatible with observation. Clinically the patient looks like a 3/10 A 3/10 is viewed as "Moderate" and described as significantly interfering with activities of daily living (ADL). It becomes difficult to feed, bathe, get dressed, get on and off the toilet or to perform personal hygiene functions. Difficult to get in and out of bed or a chair without assistance. Very distracting. With effort, it can be ignored when deeply involved in activities. Information on the proper use of the pain scale provided to the patient today. When using our objective Pain Scale, levels between 6 and 10/10 are said to belong in an emergency room, as it progressively worsens from a 6/10, described as severely limiting, requiring emergency care not usually available at an outpatient pain management facility. At a 6/10 level, communication becomes difficult and requires great effort. Assistance to reach the emergency department may be required. Facial flushing and profuse sweating along with potentially dangerous increases in heart rate and blood pressure will be evident. Effect on ADL: patient is very unstable from an achilles tendon problem in left foot and is wearing a boot.  She has had a couple of falls d/t this problem.  Timing: Constant Modifying factors: rest, medications  [name] was last scheduled for an appointment on 02/18/2017 for medication management. During today's appointment we reviewed Ms. Level's chronic pain status, as well as her outpatient medication regimen. She is  having right hip pain and leg pain. She fell on 68/19  and 2/22. She was not seen by provider. She did have ortho to call her in some medication for acute pain which she feels like was effective for her pain . She does not feel like the Norco is effective alone at this dose. She is getting very depressed secondary to the pain and weakness. She admits that she has taken 2 of her Norco today.   The patient  reports that she does not use drugs. Her body mass index is 40.64 kg/m.  Further details on both, my assessment(s), as well as the proposed treatment plan, please see below.  Controlled Substance Pharmacotherapy Assessment REMS (Risk Evaluation and Mitigation Strategy)  Analgesic:Hydrocodone/APAP 5/325 one tablet every 8 hours (15 mg/day) MME/day:15 mg/day    Janett Billow, RN  05/24/2017 11:41 AM  Sign at close encounter Nursing Pain Medication Assessment:  Safety precautions to be maintained throughout the outpatient stay will include: orient to surroundings, keep bed in low position, maintain call bell within reach at all times, provide assistance with transfer out of bed and ambulation.  Medication Inspection Compliance: Pill count conducted under aseptic conditions, in front of the patient. Neither the pills nor the bottle was removed from the patient's sight at any time. Once count was completed pills were immediately returned to the patient in their original bottle.  Medication: Hydrocodone/APAP Pill/Patch Count: 85 of 90 pills remain Pill/Patch Appearance: Markings consistent with prescribed medication Bottle Appearance: Standard pharmacy container. Clearly labeled. Filled Date: 02 / 25 / 2019 Last Medication intake:  Today   Patient has an empty bottle of hydrocodone - apap 7.5-325 prescribed by Dr Jeneen Rinks McGhee/orthopedics for pain caused by a fall.  Qty 15 filled on 05/20/17 sig; 1 table q 8 hours.    Pharmacokinetics: Liberation and absorption (onset of action):  WNL Distribution (time to peak effect): WNL Metabolism and excretion (duration of action): WNL         Pharmacodynamics: Desired effects: Analgesia: Ms. Westerhold reports >50% benefit. Functional ability: Patient reports that medication allows her to accomplish basic ADLs Clinically meaningful improvement in function (CMIF): Sustained CMIF goals met Perceived effectiveness: Described as relatively effective, allowing for increase in activities of daily living (ADL) Undesirable effects: Side-effects or Adverse reactions: None reported Monitoring: [place] PMP: Online review of the past 23-monthperiod conducted. Compliant with practice rules and regulations Last UDS on record: No results found for: SUMMARY UDS interpretation: Compliant          Medication Assessment Form: Reviewed. Patient indicates being compliant with therapy Treatment compliance: Compliant Risk Assessment Profile: Aberrant behavior: See prior evaluations. None observed or detected today Comorbid factors increasing risk of overdose: See prior notes. No additional risks detected today Risk of substance use disorder (SUD): Low Opioid Risk Tool - 05/24/17 1010      Family History of Substance Abuse   Alcohol  Negative    Illegal Drugs  Negative    Rx Drugs  Negative      Personal History of Substance Abuse   Alcohol  Negative    Illegal Drugs  Negative    Rx Drugs  Negative      Psychological Disease   Psychological Disease  Positive    ADD  Negative    OCD  Negative    Bipolar  Negative    Schizophrenia  Negative    Depression  Positive patient takes wellbutrin and xanax for anxiety and depression   patient takes wellbutrin and xanax for anxiety and depression  Total Score   Opioid Risk Tool Scoring  3    Opioid Risk Interpretation  Low Risk      ORT Scoring interpretation table:  Score <3 = Low Risk for SUD  Score between 4-7 = Moderate Risk for SUD  Score >8 = High Risk for Opioid Abuse   Risk  Mitigation Strategies:  Patient Counseling: Covered Patient-Prescriber Agreement (PPA): Present and active  Notification to other healthcare providers: Done  Pharmacologic Plan: No change in therapy, at this time.             Laboratory Chemistry  Inflammation Markers (CRP: Acute Phase) (ESR: Chronic Phase) Lab Results  Component Value Date   CRP 14.7 (H) 09/08/2016   ESRSEDRATE 58 (H) 09/08/2016                         Rheumatology Markers No results found for: RF, ANA, LABURIC, URICUR, LYMEIGGIGMAB, LYMEABIGMQN              Renal Function Markers Lab Results  Component Value Date   BUN 29 (H) 05/10/2017   CREATININE 0.89 05/10/2017   GFRAA >60 01/07/2017   GFRNONAA >60 01/07/2017                 Hepatic Function Markers Lab Results  Component Value Date   AST 12 05/10/2017   ALT 9 05/10/2017   ALBUMIN 3.9 05/10/2017   ALKPHOS 64 05/10/2017   LIPASE 120 08/12/2012                 Electrolytes Lab Results  Component Value Date   NA 141 05/10/2017   K 4.7 05/10/2017   CL 107 05/10/2017   CALCIUM 9.0 05/10/2017   MG 1.8 04/17/2015   PHOS 3.4 08/12/2012                        Neuropathy Markers Lab Results  Component Value Date   VITAMINB12 502 05/10/2017   HGBA1C 6.4 05/10/2017                 Bone Pathology Markers Lab Results  Component Value Date   VD25OH 32.15 05/10/2017                         Coagulation Parameters Lab Results  Component Value Date   INR 1.18 09/26/2015   LABPROT 15.2 (H) 09/26/2015   APTT 42 (H) 06/24/2015   PLT 271.0 05/10/2017                 Cardiovascular Markers Lab Results  Component Value Date   BNP 216.0 (H) 09/30/2016   TROPONINI <0.03 09/30/2016   HGB 10.9 (L) 05/10/2017   HCT 33.9 (L) 05/10/2017                 CA Markers No results found for: CEA, CA125, LABCA2               Note: Lab results reviewed.  Recent Diagnostic Imaging Results  CT Pelvis Wo Contrast CLINICAL DATA:  Continued left leg and  hip pain 4 weeks after a fall. Redness and bruising to the lower legs.  EXAM: CT PELVIS WITHOUT CONTRAST  TECHNIQUE: Multidetector CT imaging of the pelvis was performed following the standard protocol without intravenous contrast.  COMPARISON:  MRI abdomen 12/14/2014. CT abdomen and pelvis 08/23/2012  FINDINGS: Urinary Tract: Bladder is decompressed. No bladder  wall thickening or filling defect identified.  Bowel: Visualized colon and small bowel are not abnormally distended and no inflammatory changes are appreciated. Appendix is normal.  Vascular/Lymphatic: Iliac arteries and veins are not dilated.  Reproductive: Uterus is surgically absent. No abnormal adnexal masses.  Other: No free fluid in the pelvis pelvic wall musculature appears intact. Scarring along the midline consistent with surgical scarring.  Musculoskeletal: There is infiltration in the subcutaneous fat over the left hip and left groin region with a small intramuscular hematoma within the distal psoas muscle. No evidence of acute fracture or dislocation. No destructive or expansile bone lesions. Degenerative changes in the lower lumbar spine.  IMPRESSION: 1. No evidence of acute fracture or dislocation of the left hip. 2. Soft tissue injury to the left hip area with infiltration in the subcutaneous fat over the left hip and groin and small intramuscular hematoma in the distal psoas muscle. Muscular avulsion, contusion, or tear could be present.  Electronically Signed   By: Lucienne Capers M.D.   On: 04/19/2017 05:47 DG Chest 1 View CLINICAL DATA:  Cough and shortness of breath  EXAM: CHEST 1 VIEW  COMPARISON:  Chest CT 09/30/2016  FINDINGS: Shallow lung inflation. The heart size and mediastinal contours are within normal limits. Both lungs are clear. Right shoulder arthroplasty.  IMPRESSION: Shallow lung inflation without focal airspace disease.  Electronically Signed   By: Ulyses Jarred  M.D.   On: 04/19/2017 04:55  Complexity Note: Imaging results reviewed. Results shared with [name], using Layman's terms.                         Meds   Current Outpatient Medications:  .  albuterol (PROVENTIL HFA;VENTOLIN HFA) 108 (90 Base) MCG/ACT inhaler, Inhale 1-2 puffs into the lungs every 4 (four) hours as needed for wheezing or shortness of breath. (Patient taking differently: Inhale 2 puffs into the lungs every 4 (four) hours as needed for wheezing or shortness of breath. ), Disp: 8 g, Rfl: 2 .  albuterol (PROVENTIL) (2.5 MG/3ML) 0.083% nebulizer solution, Take 3 mLs (2.5 mg total) by nebulization every 6 (six) hours as needed for wheezing or shortness of breath., Disp: 150 mL, Rfl: 1 .  ALPRAZolam (XANAX) 0.5 MG tablet, TAKE 1 TABLET BY MOUTH TWICE DAILY AS NEEDED, Disp: 60 tablet, Rfl: 0 .  amLODipine (NORVASC) 5 MG tablet, Take 1 tablet (5 mg total) by mouth daily., Disp: 90 tablet, Rfl: 3 .  buPROPion (WELLBUTRIN XL) 300 MG 24 hr tablet, TAKE ONE TABLET BY MOUTH DAILY, Disp: 30 tablet, Rfl: 0 .  calcium carbonate (OSCAL) 1500 (600 Ca) MG TABS tablet, Take 600 mg of elemental calcium by mouth 2 (two) times daily with a meal., Disp: , Rfl:  .  cyanocobalamin 500 MCG tablet, Take 500 mcg by mouth daily., Disp: , Rfl:  .  dabigatran (PRADAXA) 150 MG CAPS capsule, Take 1 capsule (150 mg total) by mouth 2 (two) times daily., Disp: 180 capsule, Rfl: 0 .  dexlansoprazole (DEXILANT) 60 MG capsule, Take 1 capsule (60 mg total) by mouth daily. Minutes before meal (lunch), Disp: 90 capsule, Rfl: 1 .  [START ON 06/12/2017] DULoxetine (CYMBALTA) 60 MG capsule, Take 1 capsule (60 mg total) by mouth daily., Disp: 90 capsule, Rfl: 0 .  estradiol (ESTRACE) 0.1 MG/GM vaginal cream, Place 1 Applicatorful vaginally 3 (three) times a week., Disp: 42.5 g, Rfl: 12 .  Ferrous Gluconate 324 (37.5 Fe) MG TABS, Take  1 tablet (324 mg total) by mouth daily with lunch. With food, Disp: 90 tablet, Rfl: 3 .   Fluticasone-Salmeterol (ADVAIR) 250-50 MCG/DOSE AEPB, Inhale 1 puff into the lungs 2 (two) times daily., Disp: , Rfl:  .  [START ON 06/10/2017] gabapentin (NEURONTIN) 800 MG tablet, Take 1 tablet (800 mg total) by mouth 4 (four) times daily., Disp: 360 tablet, Rfl: 0 .  HYDROcodone-acetaminophen (NORCO) 7.5-325 MG tablet, Take 1 tablet by mouth 3 (three) times daily., Disp: , Rfl:  .  levocetirizine (XYZAL) 5 MG tablet, Take 1 tablet (5 mg total) by mouth every evening., Disp: 90 tablet, Rfl: 3 .  levothyroxine (SYNTHROID, LEVOTHROID) 175 MCG tablet, Take 1 tablet (175 mcg total) by mouth daily before breakfast., Disp: 90 tablet, Rfl: 0 .  meloxicam (MOBIC) 15 MG tablet, Take 15 mg by mouth daily. , Disp: , Rfl:  .  metFORMIN (GLUCOPHAGE) 1000 MG tablet, TAKE ONE TABLET BY MOUTH TWICE DAILY (Patient taking differently: TAKE ONE TABLET IN THE MORNING AND HALF TABLET AT NIGHT), Disp: 180 tablet, Rfl: 1 .  metoprolol tartrate (LOPRESSOR) 25 MG tablet, TAKE 1 TABLET BY MOUTH TWICE DAILY, Disp: 30 tablet, Rfl: 0 .  mirabegron ER (MYRBETRIQ) 25 MG TB24 tablet, Take 1 tablet (25 mg total) by mouth daily., Disp: 30 tablet, Rfl: 12 .  montelukast (SINGULAIR) 10 MG tablet, TAKE ONE TABLET BY MOUTH EVERY DAY, Disp: 90 tablet, Rfl: 3 .  Multiple Vitamin (MULTIVITAMIN WITH MINERALS) TABS tablet, Take 1 tablet by mouth daily., Disp: , Rfl:  .  ondansetron (ZOFRAN) 4 MG tablet, Take 1 tablet (4 mg total) every 8 (eight) hours as needed by mouth for nausea or vomiting., Disp: 20 tablet, Rfl: 0 .  oxybutynin (DITROPAN XL) 15 MG 24 hr tablet, Take 1 tablet (15 mg total) by mouth at bedtime., Disp: 90 tablet, Rfl: 3 .  oxyCODONE-acetaminophen (PERCOCET) 5-325 MG tablet, Take 1 tablet by mouth every 6 (six) hours as needed for severe pain., Disp: 20 tablet, Rfl: 0 .  oxyCODONE-acetaminophen (PERCOCET/ROXICET) 5-325 MG tablet, Take 1 tablet by mouth every 6 (six) hours as needed for severe pain., Disp: 9 tablet, Rfl: 0 .   quinapril (ACCUPRIL) 40 MG tablet, Take 1 tablet (40 mg total) by mouth daily. MUST KEEP APPT THIS TIME FOR ADDITIONAL REFILLS, Disp: 90 tablet, Rfl: 0 .  triamcinolone cream (KENALOG) 0.1 %, Apply 1 application 2 (two) times daily as needed topically., Disp: 45 g, Rfl: 1 .  [START ON 09/10/2017] HYDROcodone-acetaminophen (NORCO/VICODIN) 5-325 MG tablet, Take 1 tablet by mouth every 8 (eight) hours as needed for severe pain., Disp: 90 tablet, Rfl: 0 .  [START ON 07/12/2017] HYDROcodone-acetaminophen (NORCO/VICODIN) 5-325 MG tablet, Take 1 tablet by mouth every 8 (eight) hours as needed for severe pain., Disp: 90 tablet, Rfl: 0 .  [START ON 06/12/2017] HYDROcodone-acetaminophen (NORCO/VICODIN) 5-325 MG tablet, Take 1 tablet by mouth every 8 (eight) hours as needed for severe pain., Disp: 90 tablet, Rfl: 0  ROS  Constitutional: Denies any fever or chills Gastrointestinal: No reported hemesis, hematochezia, vomiting, or acute GI distress Musculoskeletal: Denies any acute onset joint swelling, redness, loss of ROM, or weakness Neurological: No reported episodes of acute onset apraxia, aphasia, dysarthria, agnosia, amnesia, paralysis, loss of coordination, or loss of consciousness  Allergies  Ms. Friedt is allergic to penicillins; aspirin; morphine and related; and terramycin [oxytetracycline].  [place]  Drug: Ms. Tomson  reports that she does not use drugs. Alcohol:  reports that she does not drink  alcohol. Tobacco:  reports that  has never smoked. she has never used smokeless tobacco. Medical:  has a past medical history of Abnormal antibody titer, Anxiety and depression, Arthritis, Asthma, Cystocele, Depression, Diabetes ([place]), DVT (deep venous thrombosis) ([place]), Dysrhythmia, Edema, GERD (gastroesophageal reflux disease), Heart murmur, History of IBS, History of methicillin resistant staphylococcus aureus (MRSA) (2008), Hyperlipidemia, Hypertension, Hypothyroidism, Insomnia, Kidney stone, Neuropathy  involving both lower extremities, Obesity, Class II, BMI 35-39.9, with comorbidity, Paroxysmal atrial fibrillation ([place]) (2007), Peripheral vascular disease ([place]), Sleep apnea, Urinary incontinence, and Venous stasis dermatitis of both lower extremities. Surgical: Ms. Rath  has a past surgical history that includes Ankle surgery (Right); Vaginal hysterectomy; Sleeve Gastroplasty; Lithotripsy; Kidney surgery (Right); transthoracic echocardiogram (08/03/2013); transesophageal echocardiogram (08/08/2013); Tonsillectomy; Total knee arthroplasty; Hiatal hernia repair; Joint replacement; Cholecystectomy; I&D extremity (Left, 06/27/2015); Application if wound vac (Left, 06/27/2015); removal of left hematoma (Left); NM GATED MYOVIEW [facility] HX) (February 2017); Reverse shoulder arthroplasty (Right, 10/08/2015); Hernia repair; Ulnar nerve transposition (Right, 08/06/2016); arm surgery; Irrigation and debridement abscess (Left, 09/07/2016); Incision and drainage of wound (Left, 09/25/2016); Application if wound vac (Left, 09/25/2016); Colonoscopy; Upper gi endoscopy; Esophagogastroduodenoscopy (egd) with propofol (N/A, 01/11/2017); Colonoscopy with propofol (N/A, 01/11/2017); Colonoscopy with propofol (N/A, 02/22/2017); and OTHER SURGICAL HISTORY. Family: family history includes Alcohol abuse in her father; Arthritis in her maternal grandfather, maternal grandmother, mother, paternal grandfather, and paternal grandmother; Breast cancer in her maternal aunt; Cancer in her father and maternal aunt; Cerebral aneurysm in her father; Diabetes in her father, maternal grandmother, and paternal grandmother; Heart disease in her maternal grandfather; Hypertension in her father, maternal grandfather, maternal grandmother, paternal grandfather, and paternal grandmother; Kidney disease in her mother; Mental illness in her sister; Peripheral vascular disease in her father; Skin cancer in her father; Varicose Veins in her mother.  Constitutional  Exam  General appearance: Well nourished, well developed, and well hydrated. In no apparent acute distress Vitals:   05/24/17 0953  BP: (!) 150/56  Pulse: (!) 51  Resp: 16  Temp: 98.1 F (36.7 C)  TempSrc: Oral  SpO2: 98%  Weight: 248 lb (112.5 kg)  Height: 5' 5.5" (1.664 m)  Psych/Mental status: Alert, oriented x 3 (person, place, & time)       Eyes: PERLA Respiratory: No evidence of acute respiratory distress  Cervical Spine Area Exam  Skin & Axial Inspection: No masses, redness, edema, swelling, or associated skin lesions Alignment: Symmetrical Functional ROM: Unrestricted ROM      Stability: No instability detected Muscle Tone/Strength: Functionally intact. No obvious neuro-muscular anomalies detected. Sensory (Neurological): Unimpaired Palpation: No palpable anomalies              Upper Extremity (UE) Exam    Side: Right upper extremity  Side: Left upper extremity  Skin & Extremity Inspection: Skin color, temperature, and hair growth are WNL. No peripheral edema or cyanosis. No masses, redness, swelling, asymmetry, or associated skin lesions. No contractures.  Skin & Extremity Inspection: Skin color, temperature, and hair growth are WNL. No peripheral edema or cyanosis. No masses, redness, swelling, asymmetry, or associated skin lesions. No contractures.  Functional ROM: Unrestricted ROM          Functional ROM: Unrestricted ROM          Muscle Tone/Strength: Functionally intact. No obvious neuro-muscular anomalies detected.  Muscle Tone/Strength: Functionally intact. No obvious neuro-muscular anomalies detected.  Sensory (Neurological): Unimpaired          Sensory (Neurological): Unimpaired  Palpation: No palpable anomalies              Palpation: No palpable anomalies              Specialized Test(s): Deferred         Specialized Test(s): Deferred          Thoracic Spine Area Exam  Skin & Axial Inspection: No masses, redness, or swelling Alignment:  Symmetrical Functional ROM: Unrestricted ROM Stability: No instability detected Muscle Tone/Strength: Functionally intact. No obvious neuro-muscular anomalies detected. Sensory (Neurological): Unimpaired Muscle strength & Tone: No palpable anomalies  Lumbar Spine Area Exam  Skin & Axial Inspection: No masses, redness, or swelling Alignment: Symmetrical Functional ROM: Unrestricted ROM      Stability: No instability detected Muscle Tone/Strength: Functionally intact. No obvious neuro-muscular anomalies detected. Sensory (Neurological): Unimpaired Palpation: No palpable anomalies       Provocative Tests: Lumbar Hyperextension and rotation test: evaluation deferred today       Lumbar Lateral bending test: evaluation deferred today       Patrick's Maneuver: evaluation deferred today                    Gait & Posture Assessment  Ambulation: Patient came in today in a wheel chair Gait: Relatively normal for age and body habitus Posture: WNL   Lower Extremity Exam    Side: Right lower extremity  Side: Left lower extremity  Skin & Extremity Inspection:   Skin & Extremity Inspection:   Functional ROM: Painful restricted ROM          Functional ROM: Unrestricted ROM          Muscle Tone/Strength: Generalized lower extremity weakness  Muscle Tone/Strength: Generalized lower extremity weakness  Sensory (Neurological): Unimpaired  Sensory (Neurological): Unimpaired  Palpation: No palpable anomalies  Palpation: No palpable anomalies   Assessment  Primary Diagnosis & Pertinent Problem List: The primary encounter diagnosis was Chronic hip pain, right. Diagnoses of Pain in joint involving right pelvic region and thigh, Diabetic peripheral neuropathy associated with type 2 diabetes mellitus ([place]), Chronic pain syndrome, Spinal stenosis of thoracic region, Pain of left hip joint, and Depression with anxiety were also pertinent to this visit.  Status Diagnosis  Controlled Controlled Controlled 1.  Chronic hip pain, right   2. Pain in joint involving right pelvic region and thigh   3. Diabetic peripheral neuropathy associated with type 2 diabetes mellitus ([place])   4. Chronic pain syndrome   5. Spinal stenosis of thoracic region   6. Pain of left hip joint   7. Depression with anxiety     Problems updated and reviewed during this visit: No problems updated. Plan of Care  Pharmacotherapy (Medications Ordered): Meds ordered this encounter  Medications  . DULoxetine (CYMBALTA) 60 MG capsule    Sig: Take 1 capsule (60 mg total) by mouth daily.    Dispense:  90 capsule    Refill:  0    Do not place this medication, or any other prescription from our practice, on "Automatic Refill". Patient may have prescription filled one day early if pharmacy is closed on scheduled refill date.    Order Specific Question:   Supervising Provider    Answer:   Milinda Pointer [phone]  . gabapentin (NEURONTIN) 800 MG tablet    Sig: Take 1 tablet (800 mg total) by mouth 4 (four) times daily.    Dispense:  360 tablet    Refill:  0    Do  not place this medication, or any other prescription from our practice, on "Automatic Refill". Patient may have prescription filled one day early if pharmacy is closed on scheduled refill date.    Order Specific Question:   Supervising Provider    Answer:   Milinda Pointer [phone]  . HYDROcodone-acetaminophen (NORCO/VICODIN) 5-325 MG tablet    Sig: Take 1 tablet by mouth every 8 (eight) hours as needed for severe pain.    Dispense:  90 tablet    Refill:  0    Do not add this medication to the electronic "Automatic Refill" notification system. Patient may have prescription filled one day early if pharmacy is closed on scheduled refill date. Do not fill until:08/11/2017 To last until: 09/10/2017    Order Specific Question:   Supervising Provider    Answer:   Milinda Pointer [phone]  . HYDROcodone-acetaminophen (NORCO/VICODIN) 5-325 MG tablet    Sig: Take 1  tablet by mouth every 8 (eight) hours as needed for severe pain.    Dispense:  90 tablet    Refill:  0    Do not add this medication to the electronic "Automatic Refill" notification system. Patient may have prescription filled one day early if pharmacy is closed on scheduled refill date. Do not fill until: 07/12/2017 To last until: 08/11/2017    Order Specific Question:   Supervising Provider    Answer:   Milinda Pointer [phone]  . HYDROcodone-acetaminophen (NORCO/VICODIN) 5-325 MG tablet    Sig: Take 1 tablet by mouth every 8 (eight) hours as needed for severe pain.    Dispense:  90 tablet    Refill:  0    Do not add this medication to the electronic "Automatic Refill" notification system. Patient may have prescription filled one day early if pharmacy is closed on scheduled refill date. Do not fill until: 06/12/2017 To last until: 07/12/2017    Order Specific Question:   Supervising Provider    Answer:   Milinda Pointer [phone]  . orphenadrine (NORFLEX) injection 60 mg  . ketorolac (TORADOL) injection 60 mg   New Prescriptions   No medications on file   Medications administered today: We administered orphenadrine and ketorolac. Lab-work, procedure(s), and/or referral(s): Orders Placed This Encounter  Procedures  . DG HIP UNILAT W OR W/O PELVIS 2-3 VIEWS RIGHT  . DG Si Joints   Imaging and/or referral(s): None  Interventional therapies: Planned, scheduled, and/or pending:  Continue with current regimen (Always stop Pradaxa x 5 days prior to any blocks.)   Considering:  Diagnostic Right suprascapular nerve block Diagnostic right intra-articular knee injection Possible series of 5 Hyalgan knee injections Diagnostic right-sided genicular nerve block Possible right-sided genicular nerve radiofrequencyablation  Palliative right intra-articular shoulder joint injection Diagnostic right-sided suprascapular nerve block Possible right-sided suprascapular nerve  radiofrequencyablation  Palliative right lateral femoral cutaneous nerve block Palliative repeat right sided lateral femoral continuous nerve radiofrequencyablation   Palliative PRN treatment(s):  Diagnostic right intra-articular knee injection Possible series of 5 Hyalgan knee injections Diagnostic right-sided genicular nerve block Palliative right intra-articular shoulder joint injection Diagnostic right-sided suprascapular nerve block Palliative right lateral femoral cutaneous nerve block Palliative repeat right sided lateral femoral continuous nerve radiofrequencyablation      Provider-requested follow-up: Return in about 3 months (around 08/24/2017) for MedMgmt with Me Donella Stade Edison Pace).  Future Appointments  Date Time Provider [place]  05/25/2017  1:00 PM Alanson Puls, [place] [facility] None  05/25/2017  4:00 PM Wellington Hampshire, MD [facility]  05/27/2017  1:00  PM [place], Tappen S, PT [facility] None  06/01/2017  1:30 PM [place], Kristine S, PT [facility] None  06/03/2017  1:00 PM [place], Kristine S, PT [facility] None  06/08/2017  1:45 PM [place], Kristine S, PT [facility] None  06/15/2017  1:45 PM [place], Kristine S, PT [facility] None  06/17/2017  3:15 PM [place], Kristine S, PT [facility] None  06/22/2017  3:15 PM [place], Kristine S, PT [facility] None  06/24/2017  1:30 PM [name], [name], MD [facility]  06/29/2017  3:00 PM [place], Kristine S, PT [facility] None  07/01/2017  2:30 PM [place], Kristine S, PT [facility] None  07/06/2017  2:45 PM [place], Mul[place] S, PT [facility] None  07/08/2017  2:30 PM [place], Ashley S, PT [facility] None  07/13/2017  2:30 PM [place], Newton S, PT [facility] None  07/15/2017  2:30 PM [place], Kristine S, PT [facility] None  08/24/2017 12:45 PM Vevelyn Francois, NP [facility] None   Primary Care Physician: [name], [name], MD Location: [facility] Note by:  Vevelyn Francois NP Date: 05/24/2017; Time: 2:14 PM  Pain Score Disclaimer: We use the NRS-11 scale. This is a self-reported, subjective measurement of pain severity with only modest accuracy. It is used primarily to identify changes within a particular patient. It must be understood that outpatient pain scales are significantly less accurate that those used for research, where they can be applied under ideal controlled circumstances with minimal exposure to variables. In reality, the score is likely to be a combination of pain intensity and pain affect, where pain affect describes the degree of emotional arousal or changes in action readiness caused by the sensory experience of pain. Factors such as social and work situation, setting, emotional state, anxiety levels, expectation, and prior pain experience may influence pain perception and show large inter-individual differences that may also be affected by time variables.  Patient instructions provided during this appointment: Patient Instructions   ____________________________________________________________________________________________  Medication Rules  Applies to: All patients receiving prescriptions (written or electronic).  Pharmacy of record: Pharmacy where electronic prescriptions will be sent. If written prescriptions are taken to a different pharmacy, please inform the nursing staff. The pharmacy listed in the electronic medical record should be the one where you would like electronic prescriptions to be sent.  Prescription refills: Only during scheduled appointments. Applies to both, written and electronic prescriptions.  NOTE: The following applies primarily to controlled substances (Opioid* Pain Medications).   Patient's responsibilities: 1. Pain Pills: Bring all pain pills to every appointment (except for procedure appointments). 2. Pill Bottles: Bring pills in original pharmacy bottle. Always bring newest bottle. Bring bottle,  even if empty. 3. Medication refills: You are responsible for knowing and keeping track of what medications you need refilled. The day before your appointment, write a list of all prescriptions that need to be refilled. Bring that list to your appointment and give it to the admitting nurse. Prescriptions will be written only during appointments. If you forget a medication, it will not be "Called in", "Faxed", or "electronically sent". You will need to get another appointment to get these prescribed. 4. Prescription Accuracy: You are responsible for carefully inspecting your prescriptions before leaving our office. Have the discharge nurse carefully go over each prescription with you, before taking them home. Make sure that your name is accurately spelled, that your address is correct. Check the name and dose of your medication to make sure it is accurate. Check the number of pills, and the written  instructions to make sure they are clear and accurate. Make sure that you are given enough medication to last until your next medication refill appointment. 5. Taking Medication: Take medication as prescribed. Never take more pills than instructed. Never take medication more frequently than prescribed. Taking less pills or less frequently is permitted and encouraged, when it comes to controlled substances (written prescriptions).  6. Inform other Doctors: Always inform, all of your healthcare providers, of all the medications you take. 7. Pain Medication from other Providers: You are not allowed to accept any additional pain medication from any other Doctor or Healthcare provider. There are two exceptions to this rule. (see below) In the event that you require additional pain medication, you are responsible for notifying us, as stated below. 8. Medication Agreement: You are responsible for carefully reading and following our Medication Agreement. This must be signed before receiving any prescriptions from our  practice. Safely store a copy of your signed Agreement. Violations to the Agreement will result in no further prescriptions. (Additional copies of our Medication Agreement are available upon request.) 9. Laws, Rules, & Regulations: All patients are expected to follow all Federal and [employer], Rules, [employer]. Ignorance of the Laws does not constitute a valid excuse. The use of any illegal substances is prohibited. 10. Adopted CDC guidelines & recommendations: Target dosing levels will be at or below 60 MME/day. Use of benzodiazepines** is not recommended.  Exceptions: There are only two exceptions to the rule of not receiving pain medications from other Healthcare Providers. 1. Exception #1 (Emergencies): In the event of an emergency (i.e.: accident requiring emergency care), you are allowed to receive additional pain medication. However, you are responsible for: As soon as you are able, call our office [phone], at any time of the day or night, and leave a message stating your name, the date and nature of the emergency, and the name and dose of the medication prescribed. In the event that your call is answered by a member of our staff, make sure to document and save the date, time, and the name of the person that took your information.  2. Exception #2 (Planned Surgery): In the event that you are scheduled by another doctor or dentist to have any type of surgery or procedure, you are allowed (for a period no longer than 30 days), to receive additional pain medication, for the acute post-op pain. However, in this case, you are responsible for picking up a copy of our "Post-op Pain Management for Surgeons" handout, and giving it to your surgeon or dentist. This document is available at our office, and does not require an appointment to obtain it. Simply go to our office during business hours (Monday-Thursday from 8:00 AM to 4:00 PM) (Friday 8:00 AM to 12:00 Noon) or if you have a  scheduled appointment with [place], prior to your surgery, and ask for it by name. In addition, you will need to provide [place] with your name, name of your surgeon, type of surgery, and date of procedure or surgery.  *Opioid medications include: morphine, codeine, oxycodone, oxymorphone, hydrocodone, hydromorphone, meperidine, tramadol, tapentadol, buprenorphine, fentanyl, methadone. **Benzodiazepine medications include: diazepam (Valium), alprazolam (Xanax), clonazepam (Klonopine), lorazepam (Ativan), clorazepate (Tranxene), chlordiazepoxide (Librium), estazolam (Prosom), oxazepam (Serax), temazepam (Restoril), triazolam (Halcion) (Last updated: 05/20/2017) ____________________________________________________________________________________________    BMI Assessment: Estimated body mass index is 40.64 kg/m as calculated from the following:   Height as of this encounter: 5' 5.5" (1.664 m).   Weight as of this  encounter: 248 lb (112.5 kg).  BMI interpretation table: BMI level Category Range association with higher incidence of chronic pain  <18 kg/m2 Underweight   18.5-24.9 kg/m2 Ideal body weight   25-29.9 kg/m2 Overweight Increased incidence by 20%  30-34.9 kg/m2 Obese (Class I) Increased incidence by 68%  35-39.9 kg/m2 Severe obesity (Class II) Increased incidence by 136%  >40 kg/m2 Extreme obesity (Class III) Increased incidence by 254%   BMI Readings from Last 4 Encounters:  05/24/17 40.64 kg/m  05/13/17 42.25 kg/m  04/20/17 44.31 kg/m  04/19/17 44.28 kg/m   Wt Readings from Last 4 Encounters:  05/24/17 248 lb (112.5 kg)  05/13/17 257 lb 12.8 oz (116.9 kg)  04/20/17 262 lb 3.2 oz (118.9 kg)  04/19/17 262 lb (118.8 kg)

## 2017-05-24 NOTE — Patient Instructions (Addendum)
____________________________________________________________________________________________  Medication Rules  Applies to: All patients receiving prescriptions (written or electronic).  Pharmacy of record: Pharmacy where electronic prescriptions will be sent. If written prescriptions are taken to a different pharmacy, please inform the nursing staff. The pharmacy listed in the electronic medical record should be the one where you would like electronic prescriptions to be sent.  Prescription refills: Only during scheduled appointments. Applies to both, written and electronic prescriptions.  NOTE: The following applies primarily to controlled substances (Opioid* Pain Medications).   Patient's responsibilities: 1. Pain Pills: Bring all pain pills to every appointment (except for procedure appointments). 2. Pill Bottles: Bring pills in original pharmacy bottle. Always bring newest bottle. Bring bottle, even if empty. 3. Medication refills: You are responsible for knowing and keeping track of what medications you need refilled. The day before your appointment, write a list of all prescriptions that need to be refilled. Bring that list to your appointment and give it to the admitting nurse. Prescriptions will be written only during appointments. If you forget a medication, it will not be "Called in", "Faxed", or "electronically sent". You will need to get another appointment to get these prescribed. 4. Prescription Accuracy: You are responsible for carefully inspecting your prescriptions before leaving our office. Have the discharge nurse carefully go over each prescription with you, before taking them home. Make sure that your name is accurately spelled, that your address is correct. Check the name and dose of your medication to make sure it is accurate. Check the number of pills, and the written instructions to make sure they are clear and accurate. Make sure that you are given enough medication to last  until your next medication refill appointment. 5. Taking Medication: Take medication as prescribed. Never take more pills than instructed. Never take medication more frequently than prescribed. Taking less pills or less frequently is permitted and encouraged, when it comes to controlled substances (written prescriptions).  6. Inform other Doctors: Always inform, all of your healthcare providers, of all the medications you take. 7. Pain Medication from other Providers: You are not allowed to accept any additional pain medication from any other Doctor or Healthcare provider. There are two exceptions to this rule. (see below) In the event that you require additional pain medication, you are responsible for notifying us, as stated below. 8. Medication Agreement: You are responsible for carefully reading and following our Medication Agreement. This must be signed before receiving any prescriptions from our practice. Safely store a copy of your signed Agreement. Violations to the Agreement will result in no further prescriptions. (Additional copies of our Medication Agreement are available upon request.) 9. Laws, Rules, & Regulations: All patients are expected to follow all Federal and State Laws, Statutes, Rules, & Regulations. Ignorance of the Laws does not constitute a valid excuse. The use of any illegal substances is prohibited. 10. Adopted CDC guidelines & recommendations: Target dosing levels will be at or below 60 MME/day. Use of benzodiazepines** is not recommended.  Exceptions: There are only two exceptions to the rule of not receiving pain medications from other Healthcare Providers. 1. Exception #1 (Emergencies): In the event of an emergency (i.e.: accident requiring emergency care), you are allowed to receive additional pain medication. However, you are responsible for: As soon as you are able, call our office [phone], at any time of the day or night, and leave a message stating your name, the  date and nature of the emergency, and the name and dose of the medication   prescribed. In the event that your call is answered by a member of our staff, make sure to document and save the date, time, and the name of the person that took your information.  2. Exception #2 (Planned Surgery): In the event that you are scheduled by another doctor or dentist to have any type of surgery or procedure, you are allowed (for a period no longer than 30 days), to receive additional pain medication, for the acute post-op pain. However, in this case, you are responsible for picking up a copy of our "Post-op Pain Management for Surgeons" handout, and giving it to your surgeon or dentist. This document is available at our office, and does not require an appointment to obtain it. Simply go to our office during business hours (Monday-Thursday from 8:00 AM to 4:00 PM) (Friday 8:00 AM to 12:00 Noon) or if you have a scheduled appointment with [place], prior to your surgery, and ask for it by name. In addition, you will need to provide [place] with your name, name of your surgeon, type of surgery, and date of procedure or surgery.  *Opioid medications include: morphine, codeine, oxycodone, oxymorphone, hydrocodone, hydromorphone, meperidine, tramadol, tapentadol, buprenorphine, fentanyl, methadone. **Benzodiazepine medications include: diazepam (Valium), alprazolam (Xanax), clonazepam (Klonopine), lorazepam (Ativan), clorazepate (Tranxene), chlordiazepoxide (Librium), estazolam (Prosom), oxazepam (Serax), temazepam (Restoril), triazolam (Halcion) (Last updated: 05/20/2017) ____________________________________________________________________________________________    BMI Assessment: Estimated body mass index is 40.64 kg/m as calculated from the following:   Height as of this encounter: 5' 5.5" (1.664 m).   Weight as of this encounter: 248 lb (112.5 kg).  BMI interpretation table: BMI level Category Range association with higher  incidence of chronic pain  <18 kg/m2 Underweight   18.5-24.9 kg/m2 Ideal body weight   25-29.9 kg/m2 Overweight Increased incidence by 20%  30-34.9 kg/m2 Obese (Class I) Increased incidence by 68%  35-39.9 kg/m2 Severe obesity (Class II) Increased incidence by 136%  >40 kg/m2 Extreme obesity (Class III) Increased incidence by 254%   BMI Readings from Last 4 Encounters:  05/24/17 40.64 kg/m  05/13/17 42.25 kg/m  04/20/17 44.31 kg/m  04/19/17 44.28 kg/m   Wt Readings from Last 4 Encounters:  05/24/17 248 lb (112.5 kg)  05/13/17 257 lb 12.8 oz (116.9 kg)  04/20/17 262 lb 3.2 oz (118.9 kg)  04/19/17 262 lb (118.8 kg)

## 2017-05-25 ENCOUNTER — Ambulatory Visit: Payer: PPO | Attending: Surgery | Admitting: Physical Therapy

## 2017-05-25 ENCOUNTER — Encounter: Payer: Self-pay | Admitting: Physical Therapy

## 2017-05-25 ENCOUNTER — Encounter: Payer: Self-pay | Admitting: Cardiovascular Disease

## 2017-05-25 ENCOUNTER — Ambulatory Visit: Payer: PPO | Admitting: Cardiovascular Disease

## 2017-05-25 VITALS — BP 126/74 | HR 49 | Ht 65.0 in | Wt 251.8 lb

## 2017-05-25 DIAGNOSIS — R262 Difficulty in walking, not elsewhere classified: Secondary | ICD-10-CM

## 2017-05-25 DIAGNOSIS — I10 Essential (primary) hypertension: Secondary | ICD-10-CM

## 2017-05-25 DIAGNOSIS — I4891 Unspecified atrial fibrillation: Secondary | ICD-10-CM

## 2017-05-25 DIAGNOSIS — M6281 Muscle weakness (generalized): Secondary | ICD-10-CM | POA: Diagnosis not present

## 2017-05-25 DIAGNOSIS — M25552 Pain in left hip: Secondary | ICD-10-CM | POA: Insufficient documentation

## 2017-05-25 DIAGNOSIS — I480 Paroxysmal atrial fibrillation: Secondary | ICD-10-CM

## 2017-05-25 DIAGNOSIS — I739 Peripheral vascular disease, unspecified: Secondary | ICD-10-CM

## 2017-05-25 MED ORDER — DABIGATRAN ETEXILATE MESYLATE 150 MG PO CAPS: 150.0000 mg | ORAL_CAPSULE | Freq: Two times a day (BID) | ORAL | 2 refills | Status: DC

## 2017-05-25 NOTE — Therapy (Signed)
[place] MAIN [facility] SERVICES [address] [place], [place], [address] Phone: [phone]   Fax:  [phone]  Physical Therapy Treatment  Patient Details  Name: [name] MRN: [id number] Date of Birth: 11/19/1950 Referring Provider: Dr. Roland Rack   Encounter Date: 05/25/2017  PT End of Session - 05/25/17 1317    Visit Number  5    Number of Visits  17    Date for PT Re-Evaluation  06/26/17    PT Start Time  1310    PT Stop Time  1348    PT Time Calculation (min)  38 min    Equipment Utilized During Treatment  Gait belt    Activity Tolerance  Patient tolerated treatment well;Patient limited by pain    Behavior During Therapy  [facility] for tasks assessed/performed       Past Medical History:  Diagnosis Date  . Abnormal antibody titer   . Anxiety and depression   . Arthritis   . Asthma   . Cystocele   . Depression   . Diabetes ([place])   . DVT (deep venous thrombosis) (HCC)    Righ calf  . Dysrhythmia   . Edema   . GERD (gastroesophageal reflux disease)   . Heart murmur    Echocardiogram June 2015: Mild MR (possible vegetation seen on TTE, not seen on TEE), normal LV size with moderate concentric LVH. Normal function EF 60-65%. Normal diastolic function. Mild LA dilation.  Marland Kitchen History of IBS   . History of methicillin resistant staphylococcus aureus (MRSA) 2008  . Hyperlipidemia   . Hypertension   . Hypothyroidism   . Insomnia   . Kidney stone    kidney stones with lithotripsy  . Neuropathy involving both lower extremities   . Obesity, Class II, BMI 35-39.9, with comorbidity   . Paroxysmal atrial fibrillation ([place]) 2007   In June 2015, Cardiac Event Monitor: Mostly SR/sinus arrhythmia with PVCs that are frequent. Short bursts of A. fib lasting several minutes;; CHA2DS2-VASc Score = 5 (age, Female, PVD, DM, HTN)  . Peripheral vascular disease ([place])   . Sleep apnea    use C-PAP  . Urinary incontinence   . Venous stasis dermatitis of  both lower extremities     Past Surgical History:  Procedure Laterality Date  . ANKLE SURGERY Right   . APPLICATION OF WOUND VAC Left 06/27/2015   Procedure: APPLICATION OF WOUND VAC ( POSSIBLE ) ;  Surgeon: [name], MD;  Location: [facility] ORS;  Service: Vascular;  Laterality: Left;  . APPLICATION OF WOUND VAC Left 09/25/2016   Procedure: APPLICATION OF WOUND VAC;  Surgeon: [name], DPM;  Location: [facility] ORS;  Service: Podiatry;  Laterality: Left;  . arm surgery     right    . CHOLECYSTECTOMY    . COLONOSCOPY    . COLONOSCOPY WITH PROPOFOL N/A 01/11/2017   Procedure: COLONOSCOPY WITH PROPOFOL;  Surgeon: [name], MD;  Location: [facility] ENDOSCOPY;  Service: Endoscopy;  Laterality: N/A;  . COLONOSCOPY WITH PROPOFOL N/A 02/22/2017   Procedure: COLONOSCOPY WITH PROPOFOL;  Surgeon: [name], MD;  Location: [facility] ENDOSCOPY;  Service: Endoscopy;  Laterality: N/A;  . ESOPHAGOGASTRODUODENOSCOPY (EGD) WITH PROPOFOL N/A 01/11/2017   Procedure: ESOPHAGOGASTRODUODENOSCOPY (EGD) WITH PROPOFOL;  Surgeon: [name], MD;  Location: [facility] ENDOSCOPY;  Service: Endoscopy;  Laterality: N/A;  . HERNIA REPAIR     umbilical  . HIATAL HERNIA REPAIR    . I&D EXTREMITY Left 06/27/2015   Procedure:  IRRIGATION AND DEBRIDEMENT EXTREMITY            ( CALF HEMATOMA ) POSSIBLE WOUND VAC;  Surgeon: [name], MD;  Location: [facility] ORS;  Service: Vascular;  Laterality: Left;  . INCISION AND DRAINAGE OF WOUND Left 09/25/2016   Procedure: IRRIGATION AND DEBRIDEMENT - PARTIAL RESECTION OF ACHILLES TENDON WITH WOUND VAC APPLICATION;  Surgeon: [name], DPM;  Location: [facility] ORS;  Service: Podiatry;  Laterality: Left;  . IRRIGATION AND DEBRIDEMENT ABSCESS Left 09/07/2016   Procedure: IRRIGATION AND DEBRIDEMENT ABSCESS LEFT HEEL;  Surgeon: [name], DPM;  Location: [facility] ORS;  Service: Podiatry;  Laterality: Left;  . JOINT REPLACEMENT     left knee replacement  . KIDNEY SURGERY Right     kidney stones  . LITHOTRIPSY    . NM [facility] HX)  February 2017   Likely breast attenuation. LOW RISK study. Normal EF 55-60%.  . OTHER SURGICAL HISTORY     08/2016 or 09/2016 surgery on achilles tenso h/o staph infection heal. Dr. [name]  . removal of left hematoma Left    leg  . REVERSE SHOULDER ARTHROPLASTY Right 10/08/2015   Procedure: REVERSE SHOULDER ARTHROPLASTY;  Surgeon: [name], MD;  Location: [facility] ORS;  Service: Orthopedics;  Laterality: Right;  . SLEEVE GASTROPLASTY    . TONSILLECTOMY    . TOTAL KNEE ARTHROPLASTY    . TRANSESOPHAGEAL ECHOCARDIOGRAM  08/08/2013   Mild LVH, EF 60-65%. Moderate LA dilation and mild RA dilation. Mild MR with no evidence of stenosis and no evidence of endocarditis. A false chordae is noted.  . TRANSTHORACIC ECHOCARDIOGRAM  08/03/2013   Mild-Moderate concentric LVH, EF 60-65%. Normal diastolic function. Mild LA dilation. Mild MR with possible vegetation  - not confirmed on TEE   . ULNAR NERVE TRANSPOSITION Right 08/06/2016   Procedure: ULNAR NERVE DECOMPRESSION/TRANSPOSITION;  Surgeon: [name], MD;  Location: [facility] ORS;  Service: Orthopedics;  Laterality: Right;  . UPPER GI ENDOSCOPY    . VAGINAL HYSTERECTOMY      There were no vitals filed for this visit.  Subjective Assessment - 05/25/17 1313    Subjective  Patient reports that she did not use her RW after her last visit like she was educated to do. She was using her cane and she fell down on friday. She has reinjured her right  buttocks area and now having increased pain 7/10 with a pain pill.     Patient is accompained by:  Family member    Pertinent History  Patient presents with hx of DM, PVD, peripheral neuropathy. In June 2018 patient suffered staph infection and subsequent necrosis of L Achilles tendon requiring partial resection. Patient has suffered three falls in last six months, including one where she felt a "pop" in the hip while getting out of the bathtub. Went to  MD two days later and received x-ray, negative; diagnosed with muscle strain and was recommended exercises. ER visit on 1/28 following third fall onto L hip, diagnosed with hematoma. Patient reports reduced sensation in feet L > R. Requires assistance to get into and out of bed since tendon surgery. Patient uses spc for ambulation and reports that small steps do not cause pain.    Limitations  Walking;Sitting;Standing;Lifting;House hold activities    How long can you sit comfortably?  1 hr    How long can you stand comfortably?  n/a    How long can you walk comfortably?  40 min    Diagnostic tests  x-ray, negative, CT scan showed torn muscle    Patient Stated Goals  Improve balance, reduce fall risk, and can go somewhere without FOF    Pain Score  7     Pain Location  Buttocks    Pain Orientation  Right    Pain Descriptors / Indicators  Tender    Pain Type  Chronic pain    Pain Onset  More than a month ago    Aggravating Factors   sit to stand makes it worse    Pain Relieving Factors  rest , medications    Pain Onset  1 to 4 weeks ago       Treatment:  Side stepping left and right x 10 x 5 repetitions 3 way hip x 15 BLE  Step over 1/2 foam x 10 left and x 10 right  Sit to stand with CGA and pain in right buttocks x 5  Gait training with Rw and CGA and left walking shoe/brace 200 feet x 1, 100 feet x 1  Goals reviewed and outcome measures performed. Patient is making progress towards goals with pain limiting her progress and L foot ulcer also limiting her progress.                       PT Education - 05/25/17 1316    Education provided  Yes    Education Details  HEP    Person(s) Educated  Patient    Methods  Explanation;Demonstration;Verbal cues    Comprehension  Verbalized understanding;Returned demonstration;Verbal cues required       PT Short Term Goals - 04/26/17 1502      PT SHORT TERM GOAL #1   Title  Patient will be independent in home exercise  program to improve strength/mobility for better functional independence with bed mobility and ADLs.    Baseline  No HEP    Time  4    Period  Weeks    Status  New    Target Date  05/24/17      PT SHORT TERM GOAL #2   Title  Patient will report a worst pain score of 3/10 VAS in the L hip to improve tolerance with ADLs and reduce symptoms with bed mobility activities.      Baseline  6/10 with medication    Time  4    Period  Weeks    Status  New    Target Date  05/24/17      PT SHORT TERM GOAL #3   Title  Patient will demonstrate sit to supine bed mobility, supine to sit bed mobility, and rolling L and R with minimal assistance for improved functional mobility and independence    Baseline  Patient requires mod-max A for bed mobility    Time  4    Period  Weeks    Status  New    Target Date  05/24/17        PT Long Term Goals - 05/25/17 1317      PT LONG TERM GOAL #1   Title  Patient (>62 years old) will improve 10-meter walk test to 0.6 meters/sec with least restrictive AD to demonstrate reduced fall risk and capacity for limited community ambulation.    Baseline  0.21 meters/sec, 05/25/17 = .21 m/sec    Time  8    Period  Weeks    Status  On-going    Target Date  06/21/17      PT LONG TERM  GOAL #2   Title  Patient will improve LEFS score to 40/80 to demonstrate improved functional mobility and independence with ADLs    Baseline  9/80; 05/25/17= 12/80    Time  8    Period  Weeks    Status  On-going    Target Date  06/21/17      PT LONG TERM GOAL #3   Title  Patient will demonstrate ability to perform bed mobility independently for improved functional mobility and independence.    Baseline  Patient requires mod-max A for bed mobility, supine to sit with min asssit, and sit to supine with mod assist     Time  8    Period  Weeks    Status  On-going    Target Date  06/21/17              Patient will benefit from skilled therapeutic intervention in order to improve  the following deficits and impairments:     Visit Diagnosis: Pain in left hip  Difficulty in walking, not elsewhere classified  Muscle weakness (generalized)     Problem List Patient Active Problem List   Diagnosis Date Noted  . Mass of both adrenal glands ([place]) 05/16/2017  . Fatty liver 05/13/2017  . Pain of left hip joint 04/22/2017  . Abnormality of gait and mobility 04/22/2017  . Fall with injury 02/02/2017  . Anemia 11/18/2016  . Asthma 11/18/2016  . Carpal tunnel syndrome 11/18/2016  . Pain in joint involving ankle and foot 11/18/2016  . Obstructive sleep apnea of adult 11/18/2016  . Spinal stenosis of thoracic region 11/18/2016  . Abscess of tendon of left foot 09/21/2016  . Cubital tunnel syndrome on right 08/07/2016  . Chronic pain syndrome 02/25/2016  . Long term current use of opiate analgesic 02/25/2016  . Chronic right shoulder pain 02/25/2016  . Status post reverse total shoulder replacement 10/08/2015  . 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 Morbid obesity with BMI of 40.0-44.9, adult ([place]) 09/23/2015  . Chronic prescription benzodiazepine use 09/23/2015  . Osteoarthritis, multiple sites 09/23/2015  . Other shoulder lesions (Right) 08/28/2015  . Cervical radiculitis 08/06/2015  . Opiate use (15 MME/Day) 01/16/2015  . Impingement syndrome of shoulder region 01/07/2015  . Mixed incontinence 11/26/2014  . Atrophic vaginitis 11/26/2014  . Anxiety and depression 09/21/2014  . Bladder cystocele 09/21/2014  . Gastro-esophageal reflux disease without esophagitis 09/21/2014  . DM (diabetes mellitus) type II, controlled, with peripheral vascular disorder ([place]) 08/31/2014  . Diabetic peripheral neuropathy associated with type 2 diabetes mellitus ([place]) 08/31/2014  . Asthma, moderate persistent, well-controlled 08/31/2014  . Hypothyroidism, adult 08/31/2014  . Peripheral vascular disease due to secondary diabetes mellitus ([place]) 12/11/2013   . Apnea, sleep 10/19/2013  . Paroxysmal atrial fibrillation ([place]); CHA2DS2-Vasc Score 5. On Pradaxa 07/27/2013  . Essential hypertension 07/27/2013    Arelia Sneddon S,PT DPT 05/25/2017, 1:35 PM  [place] MAIN [facility] SERVICES [address] [place], [place], [address] Phone: [phone]   Fax:  [phone]  Name: [name] MRN: [id number] Date of Birth: [birth year]

## 2017-05-25 NOTE — Progress Notes (Signed)
Cardiology Office Note   Date:  05/25/2017   ID:  [name], DOB 18-Apr-1950, MRN [id number]  PCP:  [name], [name], MD  Cardiologist:   Kathlyn Sacramento, MD   Chief Complaint  Patient presents with  . OTHER    LS 2017 afib and elevated BP. Meds reviewed verbally with pt.      History of Present Illness: [name] is a 67 y.o. female who presents for a follow-up visit regarding paroxysmal atrial fibrillation. No previous history of ischemic heart disease. She had a nuclear stress test in February 2017 which was normal. She has chronic medical conditions that include diabetes mellitus, hypertension, hyperlipidemia, obesity and previous DVT. She also has sleep apnea and has been on CPAP for many years.   If she developed left foot infection with ulceration.  She is being treated by podiatry for diabetic foot ulcer with very slow improvement.  ABI in 2015 was normal. She denies Chest pain or significant shortness of breath.  She describes intermittent palpitations.  She continues to take Pradaxa for anticoagulation with no bleeding side effects.   Past Medical History:  Diagnosis Date  . Abnormal antibody titer   . Anxiety and depression   . Arthritis   . Asthma   . Cystocele   . Depression   . Diabetes ([place])   . DVT (deep venous thrombosis) (HCC)    Righ calf  . Dysrhythmia   . Edema   . GERD (gastroesophageal reflux disease)   . Heart murmur    Echocardiogram June 2015: Mild MR (possible vegetation seen on TTE, not seen on TEE), normal LV size with moderate concentric LVH. Normal function EF 60-65%. Normal diastolic function. Mild LA dilation.  Marland Kitchen History of IBS   . History of methicillin resistant staphylococcus aureus (MRSA) 2008  . Hyperlipidemia   . Hypertension   . Hypothyroidism   . Insomnia   . Kidney stone    kidney stones with lithotripsy  . Neuropathy involving both lower extremities   . Obesity, Class II, BMI 35-39.9, with comorbidity   .  Paroxysmal atrial fibrillation ([place]) 2007   In June 2015, Cardiac Event Monitor: Mostly SR/sinus arrhythmia with PVCs that are frequent. Short bursts of A. fib lasting several minutes;; CHA2DS2-VASc Score = 5 (age, Female, PVD, DM, HTN)  . Peripheral vascular disease ([place])   . Sleep apnea    use C-PAP  . Urinary incontinence   . Venous stasis dermatitis of both lower extremities     Past Surgical History:  Procedure Laterality Date  . ANKLE SURGERY Right   . APPLICATION OF WOUND VAC Left 06/27/2015   Procedure: APPLICATION OF WOUND VAC ( POSSIBLE ) ;  Surgeon: [name], MD;  Location: [facility] ORS;  Service: Vascular;  Laterality: Left;  . APPLICATION OF WOUND VAC Left 09/25/2016   Procedure: APPLICATION OF WOUND VAC;  Surgeon: [name], DPM;  Location: [facility] ORS;  Service: Podiatry;  Laterality: Left;  . arm surgery     right    . CHOLECYSTECTOMY    . COLONOSCOPY    . COLONOSCOPY WITH PROPOFOL N/A 01/11/2017   Procedure: COLONOSCOPY WITH PROPOFOL;  Surgeon: [name], MD;  Location: [facility] ENDOSCOPY;  Service: Endoscopy;  Laterality: N/A;  . COLONOSCOPY WITH PROPOFOL N/A 02/22/2017   Procedure: COLONOSCOPY WITH PROPOFOL;  Surgeon: [name], MD;  Location: [facility] ENDOSCOPY;  Service: Endoscopy;  Laterality: N/A;  . ESOPHAGOGASTRODUODENOSCOPY (EGD) WITH PROPOFOL N/A 01/11/2017   Procedure:  ESOPHAGOGASTRODUODENOSCOPY (EGD) WITH PROPOFOL;  Surgeon: [name], MD;  Location: [facility] ENDOSCOPY;  Service: Endoscopy;  Laterality: N/A;  . HERNIA REPAIR     umbilical  . HIATAL HERNIA REPAIR    . I&D EXTREMITY Left 06/27/2015   Procedure: IRRIGATION AND DEBRIDEMENT EXTREMITY            ( CALF HEMATOMA ) POSSIBLE WOUND VAC;  Surgeon: [name], MD;  Location: [facility] ORS;  Service: Vascular;  Laterality: Left;  . INCISION AND DRAINAGE OF WOUND Left 09/25/2016   Procedure: IRRIGATION AND DEBRIDEMENT - PARTIAL RESECTION OF ACHILLES TENDON WITH WOUND VAC APPLICATION;  Surgeon:  [name], DPM;  Location: [facility] ORS;  Service: Podiatry;  Laterality: Left;  . IRRIGATION AND DEBRIDEMENT ABSCESS Left 09/07/2016   Procedure: IRRIGATION AND DEBRIDEMENT ABSCESS LEFT HEEL;  Surgeon: [name], DPM;  Location: [facility] ORS;  Service: Podiatry;  Laterality: Left;  . JOINT REPLACEMENT     left knee replacement  . KIDNEY SURGERY Right    kidney stones  . LITHOTRIPSY    . NM [facility] HX)  February 2017   Likely breast attenuation. LOW RISK study. Normal EF 55-60%.  . OTHER SURGICAL HISTORY     08/2016 or 09/2016 surgery on achilles tenso h/o staph infection heal. Dr. [name]  . removal of left hematoma Left    leg  . REVERSE SHOULDER ARTHROPLASTY Right 10/08/2015   Procedure: REVERSE SHOULDER ARTHROPLASTY;  Surgeon: [name], MD;  Location: [facility] ORS;  Service: Orthopedics;  Laterality: Right;  . SLEEVE GASTROPLASTY    . TONSILLECTOMY    . TOTAL KNEE ARTHROPLASTY    . TOTAL SHOULDER REPLACEMENT Right   . TRANSESOPHAGEAL ECHOCARDIOGRAM  08/08/2013   Mild LVH, EF 60-65%. Moderate LA dilation and mild RA dilation. Mild MR with no evidence of stenosis and no evidence of endocarditis. A false chordae is noted.  . TRANSTHORACIC ECHOCARDIOGRAM  08/03/2013   Mild-Moderate concentric LVH, EF 60-65%. Normal diastolic function. Mild LA dilation. Mild MR with possible vegetation  - not confirmed on TEE   . ULNAR NERVE TRANSPOSITION Right 08/06/2016   Procedure: ULNAR NERVE DECOMPRESSION/TRANSPOSITION;  Surgeon: [name], MD;  Location: [facility] ORS;  Service: Orthopedics;  Laterality: Right;  . UPPER GI ENDOSCOPY    . VAGINAL HYSTERECTOMY       Current Outpatient Medications  Medication Sig Dispense Refill  . albuterol (PROVENTIL HFA;VENTOLIN HFA) 108 (90 Base) MCG/ACT inhaler Inhale 1-2 puffs into the lungs every 4 (four) hours as needed for wheezing or shortness of breath. (Patient taking differently: Inhale 2 puffs into the lungs every 4 (four) hours as needed  for wheezing or shortness of breath. ) 8 g 2  . albuterol (PROVENTIL) (2.5 MG/3ML) 0.083% nebulizer solution Take 3 mLs (2.5 mg total) by nebulization every 6 (six) hours as needed for wheezing or shortness of breath. 150 mL 1  . ALPRAZolam (XANAX) 0.5 MG tablet TAKE 1 TABLET BY MOUTH TWICE DAILY AS NEEDED 60 tablet 0  . amLODipine (NORVASC) 5 MG tablet Take 1 tablet (5 mg total) by mouth daily. 90 tablet 3  . buPROPion (WELLBUTRIN XL) 300 MG 24 hr tablet TAKE ONE TABLET BY MOUTH DAILY 30 tablet 0  . cyanocobalamin 500 MCG tablet Take 500 mcg by mouth daily.    . dabigatran (PRADAXA) 150 MG CAPS capsule Take 1 capsule (150 mg total) by mouth 2 (two) times daily. 180 capsule 2  . dexlansoprazole (DEXILANT) 60 MG capsule Take  1 capsule (60 mg total) by mouth daily. Minutes before meal (lunch) 90 capsule 1  . [START ON 06/12/2017] DULoxetine (CYMBALTA) 60 MG capsule Take 1 capsule (60 mg total) by mouth daily. 90 capsule 0  . estradiol (ESTRACE) 0.1 MG/GM vaginal cream Place 1 Applicatorful vaginally 3 (three) times a week. 42.5 g 12  . Ferrous Gluconate 324 (37.5 Fe) MG TABS Take 1 tablet (324 mg total) by mouth daily with lunch. With food 90 tablet 3  . Fluticasone-Salmeterol (ADVAIR) 250-50 MCG/DOSE AEPB Inhale 1 puff into the lungs 2 (two) times daily.    Derrill Memo ON 06/10/2017] gabapentin (NEURONTIN) 800 MG tablet Take 1 tablet (800 mg total) by mouth 4 (four) times daily. 360 tablet 0  . [START ON 07/12/2017] HYDROcodone-acetaminophen (NORCO/VICODIN) 5-325 MG tablet Take 1 tablet by mouth every 8 (eight) hours as needed for severe pain. 90 tablet 0  . levocetirizine (XYZAL) 5 MG tablet Take 1 tablet (5 mg total) by mouth every evening. 90 tablet 3  . levothyroxine (SYNTHROID, LEVOTHROID) 175 MCG tablet Take 1 tablet (175 mcg total) by mouth daily before breakfast. 90 tablet 0  . meloxicam (MOBIC) 15 MG tablet Take 15 mg by mouth daily.     . metFORMIN (GLUCOPHAGE) 1000 MG tablet TAKE ONE TABLET BY  MOUTH TWICE DAILY (Patient taking differently: TAKE ONE TABLET IN THE MORNING AND HALF TABLET AT NIGHT) 180 tablet 1  . metoprolol tartrate (LOPRESSOR) 25 MG tablet TAKE 1 TABLET BY MOUTH TWICE DAILY 30 tablet 0  . mirabegron ER (MYRBETRIQ) 25 MG TB24 tablet Take 1 tablet (25 mg total) by mouth daily. 30 tablet 12  . montelukast (SINGULAIR) 10 MG tablet TAKE ONE TABLET BY MOUTH EVERY DAY 90 tablet 3  . Multiple Vitamin (MULTIVITAMIN WITH MINERALS) TABS tablet Take 1 tablet by mouth daily.    . ondansetron (ZOFRAN) 4 MG tablet Take 1 tablet (4 mg total) every 8 (eight) hours as needed by mouth for nausea or vomiting. 20 tablet 0  . oxybutynin (DITROPAN XL) 15 MG 24 hr tablet Take 1 tablet (15 mg total) by mouth at bedtime. 90 tablet 3  . quinapril (ACCUPRIL) 40 MG tablet Take 1 tablet (40 mg total) by mouth daily. MUST KEEP APPT THIS TIME FOR ADDITIONAL REFILLS 90 tablet 0  . triamcinolone cream (KENALOG) 0.1 % Apply 1 application 2 (two) times daily as needed topically. 45 g 1   No current facility-administered medications for this visit.     Allergies:   Penicillins; Aspirin; Morphine and related; and Terramycin [oxytetracycline]    Social History:  The patient  reports that  has never smoked. she has never used smokeless tobacco. She reports that she does not drink alcohol or use drugs.   Family History:  The patient's family history includes Alcohol abuse in her father; Arthritis in her maternal grandfather, maternal grandmother, mother, paternal grandfather, and paternal grandmother; Breast cancer in her maternal aunt; Cancer in her father and maternal aunt; Cerebral aneurysm in her father; Diabetes in her father, maternal grandmother, and paternal grandmother; Heart disease in her maternal grandfather; Hypertension in her father, maternal grandfather, maternal grandmother, paternal grandfather, and paternal grandmother; Kidney disease in her mother; Mental illness in her sister; Peripheral  vascular disease in her father; Skin cancer in her father; Varicose Veins in her mother.    ROS:  Please see the history of present illness.   Otherwise, review of systems are positive for none.   All other systems are reviewed  and negative.    PHYSICAL EXAM: VS:  BP 126/74 (BP Location: Right Arm, Patient Position: Sitting, Cuff Size: Large)   Pulse (!) 49   Ht 5\' 5"  (1.651 m)   Wt 251 lb 12 oz (114.2 kg)   BMI 41.89 kg/m  , BMI Body mass index is 41.89 kg/m. GEN: Well nourished, well developed, in no acute distress  HEENT: normal  Neck: no JVD, carotid bruits, or masses Cardiac: RRR; no murmurs, rubs, or gallops,no edema  Respiratory:  clear to auscultation bilaterally, normal work of breathing GI: soft, nontender, nondistended, + BS MS: no deformity or atrophy  Skin: warm and dry, no rash Neuro:  Strength and sensation are intact Psych: euthymic mood, full affect Vascular: She has bilateral leg edema.  Posterior tibial pulse is palpable but weak.  EKG:  EKG is ordered today. The ekg ordered today demonstrates sinus bradycardia with a heart rate of 49 bpm.  Nonspecific ST and T wave changes.   Recent Labs: 09/30/2016: B Natriuretic Peptide 216.0 05/10/2017: ALT 9; BUN 29; Creatinine, Ser 0.89; Hemoglobin 10.9; Platelets 271.0; Potassium 4.7; Sodium 141; TSH 6.17    Lipid Panel    Component Value Date/Time   CHOL 149 05/10/2017 0925   CHOL 185 10/10/2014 0822   TRIG 81.0 05/10/2017 0925   HDL 65.50 05/10/2017 0925   HDL 94 10/10/2014 0822   CHOLHDL 2 05/10/2017 0925   VLDL 16.2 05/10/2017 0925   LDLCALC 67 05/10/2017 0925   LDLCALC 73 10/10/2014 0822      Wt Readings from Last 3 Encounters:  05/25/17 251 lb 12 oz (114.2 kg)  05/24/17 248 lb (112.5 kg)  05/13/17 257 lb 12.8 oz (116.9 kg)       No flowsheet data found.    ASSESSMENT AND PLAN:  1.  Paroxysmal atrial fibrillation: Currently in normal sinus rhythm with minimal palpitations. Continue  metoprolol 25 mg twice daily. She is tolerating anticoagulation with Pradaxa.  I reviewed her labs from last month which showed normal renal function.  There was mild anemia with a hemoglobin of 10.9.  She is mildly bradycardic but does not appear to be symptomatic.  2. Essential hypertension: Blood pressure was elevated recently but improved with the addition of amlodipine.  3.  Slow healing foot ulcer on the left side: Given her multiple risk factors, I requested lower extremity arterial Doppler to ensure no peripheral arterial disease contributing to slow healing.  4.  Mild anemia: Further evaluation by primary care physician as deemed appropriate.    Disposition:   FU with me in 6 months  Signed,  Kathlyn Sacramento, MD  05/25/2017 5:04 PM    [place]

## 2017-05-25 NOTE — Patient Instructions (Signed)
Medication Instructions:  Your physician recommends that you continue on your current medications as directed. Please refer to the Current Medication list given to you today.   Labwork: none  Testing/Procedures: Your physician has requested that you have a lower extremity arterial duplex. During this test, an ultrasound is used to evaluate arterial blood flow in the legs. Allow one hour for this exam. There are no restrictions or special instructions.   Follow-Up: Your physician wants you to follow-up in: 6 months with Dr. Fletcher Anon.  You will receive a reminder letter in the mail two months in advance. If you don't receive a letter, please call our office to schedule the follow-up appointment.   Any Other Special Instructions Will Be Listed Below (If Applicable).     If you need a refill on your cardiac medications before your next appointment, please call your pharmacy.

## 2017-05-27 ENCOUNTER — Ambulatory Visit: Payer: PPO | Admitting: Physical Therapy

## 2017-05-28 ENCOUNTER — Encounter: Payer: Self-pay | Admitting: *Deleted

## 2017-05-31 ENCOUNTER — Encounter
Admission: RE | Disposition: A | Discharge status: Home / Self Care | Payer: Self-pay | Source: Ambulatory Visit | Attending: Gastroenterology

## 2017-05-31 ENCOUNTER — Ambulatory Visit: Payer: PPO | Admitting: Certified Registered Nurse Anesthetist

## 2017-05-31 ENCOUNTER — Encounter: Payer: Self-pay | Admitting: *Deleted

## 2017-05-31 ENCOUNTER — Ambulatory Visit
Admission: RE | Admit: 2017-05-31 | Discharge: 2017-05-31 | Disposition: A | Discharge status: Home / Self Care | Payer: PPO | Source: Ambulatory Visit | Attending: Gastroenterology | Admitting: Gastroenterology

## 2017-05-31 ENCOUNTER — Encounter: Payer: PPO | Admitting: Physical Therapy

## 2017-05-31 ENCOUNTER — Other Ambulatory Visit: Payer: Self-pay | Admitting: Cardiovascular Disease

## 2017-05-31 DIAGNOSIS — E785 Hyperlipidemia, unspecified: Secondary | ICD-10-CM | POA: Insufficient documentation

## 2017-05-31 DIAGNOSIS — Z886 Allergy status to analgesic agent status: Secondary | ICD-10-CM | POA: Diagnosis not present

## 2017-05-31 DIAGNOSIS — G4700 Insomnia, unspecified: Secondary | ICD-10-CM | POA: Diagnosis not present

## 2017-05-31 DIAGNOSIS — I10 Essential (primary) hypertension: Secondary | ICD-10-CM | POA: Diagnosis not present

## 2017-05-31 DIAGNOSIS — Z79899 Other long term (current) drug therapy: Secondary | ICD-10-CM | POA: Insufficient documentation

## 2017-05-31 DIAGNOSIS — E1151 Type 2 diabetes mellitus with diabetic peripheral angiopathy without gangrene: Secondary | ICD-10-CM | POA: Diagnosis not present

## 2017-05-31 DIAGNOSIS — R197 Diarrhea, unspecified: Secondary | ICD-10-CM | POA: Diagnosis not present

## 2017-05-31 DIAGNOSIS — K5730 Diverticulosis of large intestine without perforation or abscess without bleeding: Secondary | ICD-10-CM | POA: Insufficient documentation

## 2017-05-31 DIAGNOSIS — Z7901 Long term (current) use of anticoagulants: Secondary | ICD-10-CM | POA: Diagnosis not present

## 2017-05-31 DIAGNOSIS — F329 Major depressive disorder, single episode, unspecified: Secondary | ICD-10-CM | POA: Diagnosis not present

## 2017-05-31 DIAGNOSIS — E039 Hypothyroidism, unspecified: Secondary | ICD-10-CM | POA: Insufficient documentation

## 2017-05-31 DIAGNOSIS — Z880 Allergy status to penicillin: Secondary | ICD-10-CM | POA: Diagnosis not present

## 2017-05-31 DIAGNOSIS — D125 Benign neoplasm of sigmoid colon: Secondary | ICD-10-CM | POA: Insufficient documentation

## 2017-05-31 DIAGNOSIS — I872 Venous insufficiency (chronic) (peripheral): Secondary | ICD-10-CM | POA: Diagnosis not present

## 2017-05-31 DIAGNOSIS — D126 Benign neoplasm of colon, unspecified: Secondary | ICD-10-CM | POA: Diagnosis not present

## 2017-05-31 DIAGNOSIS — Z885 Allergy status to narcotic agent status: Secondary | ICD-10-CM | POA: Diagnosis not present

## 2017-05-31 DIAGNOSIS — F418 Other specified anxiety disorders: Secondary | ICD-10-CM | POA: Diagnosis not present

## 2017-05-31 DIAGNOSIS — E859 Amyloidosis, unspecified: Secondary | ICD-10-CM | POA: Diagnosis not present

## 2017-05-31 DIAGNOSIS — G4730 Sleep apnea, unspecified: Secondary | ICD-10-CM | POA: Diagnosis not present

## 2017-05-31 DIAGNOSIS — F419 Anxiety disorder, unspecified: Secondary | ICD-10-CM | POA: Diagnosis not present

## 2017-05-31 DIAGNOSIS — J45909 Unspecified asthma, uncomplicated: Secondary | ICD-10-CM | POA: Diagnosis not present

## 2017-05-31 DIAGNOSIS — K5790 Diverticulosis of intestine, part unspecified, without perforation or abscess without bleeding: Secondary | ICD-10-CM | POA: Diagnosis not present

## 2017-05-31 DIAGNOSIS — E1140 Type 2 diabetes mellitus with diabetic neuropathy, unspecified: Secondary | ICD-10-CM | POA: Insufficient documentation

## 2017-05-31 DIAGNOSIS — D509 Iron deficiency anemia, unspecified: Secondary | ICD-10-CM | POA: Diagnosis not present

## 2017-05-31 DIAGNOSIS — Z7984 Long term (current) use of oral hypoglycemic drugs: Secondary | ICD-10-CM | POA: Diagnosis not present

## 2017-05-31 DIAGNOSIS — K589 Irritable bowel syndrome without diarrhea: Secondary | ICD-10-CM | POA: Insufficient documentation

## 2017-05-31 DIAGNOSIS — I480 Paroxysmal atrial fibrillation: Secondary | ICD-10-CM | POA: Diagnosis not present

## 2017-05-31 DIAGNOSIS — Z87442 Personal history of urinary calculi: Secondary | ICD-10-CM | POA: Insufficient documentation

## 2017-05-31 DIAGNOSIS — K635 Polyp of colon: Secondary | ICD-10-CM | POA: Diagnosis not present

## 2017-05-31 DIAGNOSIS — I739 Peripheral vascular disease, unspecified: Secondary | ICD-10-CM

## 2017-05-31 HISTORY — PX: COLONOSCOPY WITH PROPOFOL: SHX5780

## 2017-05-31 LAB — GLUCOSE, CAPILLARY: Glucose-Capillary: 151 mg/dL — ABNORMAL HIGH (ref 65–99)

## 2017-05-31 SURGERY — COLONOSCOPY WITH PROPOFOL
Anesthesia: General

## 2017-05-31 MED ORDER — LIDOCAINE HCL (PF) 1 % IJ SOLN
2.0000 mL | Freq: Once | INTRAMUSCULAR | Status: AC
Start: 2017-05-31 — End: 2017-05-31
  Administered 2017-05-31: 0.3 mL via INTRADERMAL

## 2017-05-31 MED ORDER — SODIUM CHLORIDE 0.9 % IV SOLN
INTRAVENOUS | Status: DC
  Administered 2017-05-31: 1000 mL via INTRAVENOUS

## 2017-05-31 MED ORDER — KETAMINE HCL 50 MG/ML IJ SOLN
INTRAMUSCULAR | Status: AC
  Filled 2017-05-31: qty 10

## 2017-05-31 MED ORDER — SODIUM CHLORIDE 0.9 % IV SOLN: INTRAVENOUS | Status: DC

## 2017-05-31 MED ORDER — KETAMINE HCL 50 MG/ML IJ SOLN
INTRAMUSCULAR | Status: DC | PRN
  Administered 2017-05-31: 10 mg via INTRAMUSCULAR

## 2017-05-31 MED ORDER — PHENYLEPHRINE HCL 10 MG/ML IJ SOLN
INTRAMUSCULAR | Status: AC
  Filled 2017-05-31: qty 1

## 2017-05-31 MED ORDER — PROPOFOL 10 MG/ML IV BOLUS
INTRAVENOUS | Status: AC
  Filled 2017-05-31: qty 20

## 2017-05-31 MED ORDER — LIDOCAINE HCL (CARDIAC) 20 MG/ML IV SOLN
INTRAVENOUS | Status: DC | PRN
  Administered 2017-05-31: 50 mg via INTRAVENOUS

## 2017-05-31 MED ORDER — LIDOCAINE HCL (PF) 1 % IJ SOLN
INTRAMUSCULAR | Status: AC
  Administered 2017-05-31: 0.3 mL via INTRADERMAL
  Filled 2017-05-31: qty 2

## 2017-05-31 MED ORDER — PROPOFOL 10 MG/ML IV BOLUS
INTRAVENOUS | Status: DC | PRN
  Administered 2017-05-31: 30 mg via INTRAVENOUS
  Administered 2017-05-31: 60 mg via INTRAVENOUS
  Administered 2017-05-31: 30 mg via INTRAVENOUS
  Administered 2017-05-31: 40 mg via INTRAVENOUS
  Administered 2017-05-31 (×3): 30 mg via INTRAVENOUS

## 2017-05-31 MED ORDER — SODIUM CHLORIDE 0.9 % IV SOLN
INTRAVENOUS | Status: DC | PRN
  Administered 2017-05-31: 09:00:00 via INTRAVENOUS

## 2017-05-31 MED ORDER — LIDOCAINE HCL (PF) 2 % IJ SOLN
INTRAMUSCULAR | Status: AC
  Filled 2017-05-31: qty 10

## 2017-05-31 NOTE — Anesthesia Post-op Follow-up Note (Signed)
Anesthesia QCDR form completed.        

## 2017-05-31 NOTE — H&P (Signed)
Outpatient short stay form Pre-procedure 05/31/2017 8:31 AM Dawn Sails MD  Primary Physician: Dr. Tommi Rumps  Reason for visit: Colonoscopy  History of present illness: Patient is a 67 year old female presenting today as above.  She has been presented before for colonoscopy recently however had to repeat the prep due to poor prep.  There is a history of iron deficiency anemia, dyspepsia and intermittent diarrhea.  Tolerated her prep well.  She takes Pradaxa that has been held for over 48 hours.  She takes no other blood thinning agent or aspirin product.    Current Facility-Administered Medications:  .  0.9 %  sodium chloride infusion, , Intravenous, Continuous, Dawn Sails, MD, Last Rate: 20 mL/hr at 05/31/17 0827, 1,000 mL at 05/31/17 0827 .  0.9 %  sodium chloride infusion, , Intravenous, Continuous, Dawn Sails, MD .  0.9 %  sodium chloride infusion, , Intravenous, Continuous, Dawn Sails, MD  Medications Prior to Admission  Medication Sig Dispense Refill Last Dose  . CALCIUM ALGINATE EX Apply topically.     . ferrous sulfate 325 (65 FE) MG tablet Take 325 mg by mouth daily with breakfast.     . potassium chloride (K-DUR,KLOR-CON) 10 MEQ tablet Take 10 mEq by mouth 2 (two) times daily.     Marland Kitchen albuterol (PROVENTIL HFA;VENTOLIN HFA) 108 (90 Base) MCG/ACT inhaler Inhale 1-2 puffs into the lungs every 4 (four) hours as needed for wheezing or shortness of breath. (Patient taking differently: Inhale 2 puffs into the lungs every 4 (four) hours as needed for wheezing or shortness of breath. ) 8 g 2 Taking  . albuterol (PROVENTIL) (2.5 MG/3ML) 0.083% nebulizer solution Take 3 mLs (2.5 mg total) by nebulization every 6 (six) hours as needed for wheezing or shortness of breath. 150 mL 1 Taking  . ALPRAZolam (XANAX) 0.5 MG tablet TAKE 1 TABLET BY MOUTH TWICE DAILY AS NEEDED 60 tablet 0 Taking  . amLODipine (NORVASC) 5 MG tablet Take 1 tablet (5 mg total) by mouth  daily. 90 tablet 3 Taking  . buPROPion (WELLBUTRIN XL) 300 MG 24 hr tablet TAKE ONE TABLET BY MOUTH DAILY 30 tablet 0 Taking  . cyanocobalamin 500 MCG tablet Take 500 mcg by mouth daily.   Taking  . dabigatran (PRADAXA) 150 MG CAPS capsule Take 1 capsule (150 mg total) by mouth 2 (two) times daily. 180 capsule 2 05/26/2017  . dexlansoprazole (DEXILANT) 60 MG capsule Take 1 capsule (60 mg total) by mouth daily. Minutes before meal (lunch) 90 capsule 1 Taking  . [START ON 06/12/2017] DULoxetine (CYMBALTA) 60 MG capsule Take 1 capsule (60 mg total) by mouth daily. 90 capsule 0 Taking  . estradiol (ESTRACE) 0.1 MG/GM vaginal cream Place 1 Applicatorful vaginally 3 (three) times a week. 42.5 g 12 Taking  . Ferrous Gluconate 324 (37.5 Fe) MG TABS Take 1 tablet (324 mg total) by mouth daily with lunch. With food 90 tablet 3 Taking  . Fluticasone-Salmeterol (ADVAIR) 250-50 MCG/DOSE AEPB Inhale 1 puff into the lungs 2 (two) times daily.   Taking  . [START ON 06/10/2017] gabapentin (NEURONTIN) 800 MG tablet Take 1 tablet (800 mg total) by mouth 4 (four) times daily. 360 tablet 0 Taking  . [START ON 07/12/2017] HYDROcodone-acetaminophen (NORCO/VICODIN) 5-325 MG tablet Take 1 tablet by mouth every 8 (eight) hours as needed for severe pain. 90 tablet 0 Taking  . levocetirizine (XYZAL) 5 MG tablet Take 1 tablet (5 mg total) by mouth every evening. 90 tablet 3 Taking  .  levothyroxine (SYNTHROID, LEVOTHROID) 175 MCG tablet Take 1 tablet (175 mcg total) by mouth daily before breakfast. 90 tablet 0 Taking  . meloxicam (MOBIC) 15 MG tablet Take 15 mg by mouth daily.    Taking  . metFORMIN (GLUCOPHAGE) 1000 MG tablet TAKE ONE TABLET BY MOUTH TWICE DAILY (Patient taking differently: TAKE ONE TABLET IN THE MORNING AND HALF TABLET AT NIGHT) 180 tablet 1 Taking  . metoprolol tartrate (LOPRESSOR) 25 MG tablet TAKE 1 TABLET BY MOUTH TWICE DAILY 30 tablet 0 Taking  . mirabegron ER (MYRBETRIQ) 25 MG TB24 tablet Take 1 tablet (25 mg  total) by mouth daily. 30 tablet 12 Taking  . montelukast (SINGULAIR) 10 MG tablet TAKE ONE TABLET BY MOUTH EVERY DAY 90 tablet 3 Taking  . Multiple Vitamin (MULTIVITAMIN WITH MINERALS) TABS tablet Take 1 tablet by mouth daily.   Taking  . ondansetron (ZOFRAN) 4 MG tablet Take 1 tablet (4 mg total) every 8 (eight) hours as needed by mouth for nausea or vomiting. 20 tablet 0 Taking  . oxybutynin (DITROPAN XL) 15 MG 24 hr tablet Take 1 tablet (15 mg total) by mouth at bedtime. 90 tablet 3 Taking  . quinapril (ACCUPRIL) 40 MG tablet Take 1 tablet (40 mg total) by mouth daily. MUST KEEP APPT THIS TIME FOR ADDITIONAL REFILLS 90 tablet 0 05/31/2017 at 0700  . triamcinolone cream (KENALOG) 0.1 % Apply 1 application 2 (two) times daily as needed topically. 45 g 1 Taking     Allergies  Allergen Reactions  . Penicillins Hives, Shortness Of Breath and Swelling    Facial swelling Has patient had a PCN reaction causing immediate rash, facial/tongue/throat swelling, SOB or lightheadedness with hypotension: Yes Has patient had a PCN reaction causing severe rash involving mucus membranes or skin necrosis: Yes Has patient had a PCN reaction that required hospitalization No Has patient had a PCN reaction occurring within the last 10 years: No If all of the above answers are "NO", then may proceed with Cephalosporin use.   . Aspirin Hives  . Morphine And Related Other (See Comments)    Patient becomes very confused  . Terramycin [Oxytetracycline] Hives     Past Medical History:  Diagnosis Date  . Abnormal antibody titer   . Anxiety and depression   . Arthritis   . Asthma   . Cystocele   . Depression   . Diabetes ([place])   . DVT (deep venous thrombosis) (HCC)    Righ calf  . Dysrhythmia   . Edema   . GERD (gastroesophageal reflux disease)   . Heart murmur    Echocardiogram June 2015: Mild MR (possible vegetation seen on TTE, not seen on TEE), normal LV size with moderate concentric LVH. Normal  function EF 60-65%. Normal diastolic function. Mild LA dilation.  Marland Kitchen History of IBS   . History of methicillin resistant staphylococcus aureus (MRSA) 2008  . Hyperlipidemia   . Hypertension   . Hypothyroidism   . Insomnia   . Kidney stone    kidney stones with lithotripsy  . Neuropathy involving both lower extremities   . Obesity, Class II, BMI 35-39.9, with comorbidity   . Paroxysmal atrial fibrillation ([name]) 2007   In June 2015, Cardiac Event Monitor: Mostly SR/sinus arrhythmia with PVCs that are frequent. Short bursts of A. fib lasting several minutes;; CHA2DS2-VASc Score = 5 (age, Female, PVD, DM, HTN)  . Peripheral vascular disease ([place])   . Sleep apnea    use C-PAP  . Urinary incontinence   .  Venous stasis dermatitis of both lower extremities     Review of systems:      Physical Exam    Heart and lungs: Regular rate and rhythm without rub or gallop, lungs are bilaterally clear    HEENT: Normocephalic atraumatic eyes are anicteric    Other:    Pertinant exam for procedure: Soft nontender nondistended bowel sounds positive normoactive    Planned proceedures: Colonoscopy and indicated procedures. I have discussed the risks benefits and complications of procedures to include not limited to bleeding, infection, perforation and the risk of sedation and the patient wishes to proceed.    Dawn Sails, MD Gastroenterology 05/31/2017  8:31 AM

## 2017-05-31 NOTE — Op Note (Signed)
[facility] Gastroenterology Patient Name: [name] Procedure Date: 05/31/2017 8:00 AM MRN: [id number] Account #: [id number] Date of Birth: [birth year] Admit Type: Outpatient Age: 67 Room: [facility] ENDO ROOM 1 Gender: Female Note Status: Finalized Procedure:            Colonoscopy Indications:          Clinically significant diarrhea of unexplained origin,                        Iron deficiency anemia Providers:            [name], MD Referring MD:         Angela Adam. Caryl Bis (Referring MD) Medicines:            Monitored Anesthesia Care Complications:        No immediate complications. Procedure:            Pre-Anesthesia Assessment:                       - ASA Grade Assessment: III - A patient with severe                        systemic disease.                       After obtaining informed consent, the colonoscope was                        passed under direct vision. Throughout the procedure,                        the patient's blood pressure, pulse, and oxygen                        saturations were monitored continuously. The                        Colonoscope was introduced through the anus and                        advanced to the the cecum, identified by appendiceal                        orifice and ileocecal valve. The patient tolerated the                        procedure well. The quality of the bowel preparation                        was good. The colonoscopy was performed without                        difficulty. Findings:      A few small-mouthed diverticula were found in the recto-sigmoid colon.      Four sessile polyps were found in the recto-sigmoid colon. The polyps       were 1 mm in size. These polyps were removed with a cold biopsy forceps.       Resection and retrieval were complete.      A 3 mm polyp was found in the descending colon. The polyp was sessile.  The polyp was removed with a cold biopsy forceps. Resection  and       retrieval were complete.      A 2 mm polyp was found in the sigmoid colon. The polyp was sessile. The       polyp was removed with a cold biopsy forceps. Resection and retrieval       were complete.      Biopsies for histology were taken with a cold forceps from the right       colon and left colon for evaluation of microscopic colitis.      The retroflexed view of the distal rectum and anal verge was normal and       showed no anal or rectal abnormalities. Impression:           - Diverticulosis in the recto-sigmoid colon.                       - Four 1 mm polyps at the recto-sigmoid colon, removed                        with a cold biopsy forceps. Resected and retrieved.                       - One 3 mm polyp in the descending colon, removed with                        a cold biopsy forceps. Resected and retrieved.                       - One 2 mm polyp in the sigmoid colon, removed with a                        cold biopsy forceps. Resected and retrieved.                       - Biopsies were taken with a cold forceps from the                        right colon and left colon for evaluation of                        microscopic colitis. Recommendation:       - Discharge patient to home. Procedure Code(s):    --- Professional ---                       [phone], Colonoscopy, flexible; with biopsy, single or                        multiple Diagnosis Code(s):    --- Professional ---                       D12.7, Benign neoplasm of rectosigmoid junction                       D12.4, Benign neoplasm of descending colon                       D12.5, Benign neoplasm of sigmoid colon  R19.7, Diarrhea, unspecified                       D50.9, Iron deficiency anemia, unspecified                       K57.30, Diverticulosis of large intestine without                        perforation or abscess without bleeding CPT copyright 2016 American Medical Association. All rights  reserved. The codes documented in this report are preliminary and upon coder review may  be revised to meet current compliance requirements. [name], MD 05/31/2017 9:19:38 AM This report has been signed electronically. Number of Addenda: 0 Note Initiated On: 05/31/2017 8:00 AM Scope Withdrawal Time: 0 hours 13 minutes 51 seconds  Total Procedure Duration: 0 hours 28 minutes 11 seconds       [facility]

## 2017-05-31 NOTE — Anesthesia Preprocedure Evaluation (Addendum)
Anesthesia Evaluation  Patient identified by MRN, date of birth, ID band Patient awake    Reviewed: Allergy & Precautions, NPO status , Patient's Chart, lab work & pertinent test results  History of Anesthesia Complications Negative for: history of anesthetic complications  Airway Mallampati: II  TM Distance: >3 FB Neck ROM: Full    Dental  (+) Poor Dentition, Chipped   Pulmonary asthma , sleep apnea and Continuous Positive Airway Pressure Ventilation ,    breath sounds clear to auscultation- rhonchi (-) wheezing      Cardiovascular hypertension, Pt. on medications + Peripheral Vascular Disease  (-) CAD, (-) Past MI and (-) Cardiac Stents + dysrhythmias Atrial Fibrillation + Valvular Problems/Murmurs  Rhythm:Regular Rate:Normal - Systolic murmurs and - Diastolic murmurs    Neuro/Psych PSYCHIATRIC DISORDERS Anxiety Depression  Neuromuscular disease    GI/Hepatic Neg liver ROS, GERD  ,  Endo/Other  diabetes, Oral Hypoglycemic AgentsHypothyroidism   Renal/GU Renal disease (hx of nephrolithiasis)     Musculoskeletal  (+) Arthritis ,   Abdominal (+) + obese,   Peds  Hematology  (+) anemia ,   Anesthesia Other Findings Past Medical History: No date: Abnormal antibody titer No date: Anxiety and depression No date: Arthritis No date: Asthma No date: Cystocele No date: Depression No date: Diabetes ([place]) No date: DVT (deep venous thrombosis) (HCC)     Comment:  Righ calf No date: Dysrhythmia No date: Edema No date: GERD (gastroesophageal reflux disease) No date: Heart murmur     Comment:  Echocardiogram June 2015: Mild MR (possible vegetation               seen on TTE, not seen on TEE), normal LV size with               moderate concentric LVH. Normal function EF 60-65%.               Normal diastolic function. Mild LA dilation. No date: History of IBS 2008: History of methicillin resistant staphylococcus aureus  (MRSA) No date: Hyperlipidemia No date: Hypertension No date: Hypothyroidism No date: Insomnia No date: Kidney stone     Comment:  kidney stones with lithotripsy No date: Neuropathy involving both lower extremities No date: Obesity, Class II, BMI 35-39.9, with comorbidity 2007: Paroxysmal atrial fibrillation ([place])     Comment:  In June 2015, Cardiac Event Monitor: Mostly SR/sinus               arrhythmia with PVCs that are frequent. Short bursts of               A. fib lasting several minutes;; CHA2DS2-VASc Score = 5               (age, Female, PVD, DM, HTN) No date: Peripheral vascular disease ([place]) No date: Sleep apnea     Comment:  use C-PAP No date: Urinary incontinence No date: Venous stasis dermatitis of both lower extremities   Reproductive/Obstetrics                            Anesthesia Physical  Anesthesia Plan  ASA: III  Anesthesia Plan: General   Post-op Pain Management:    Induction: Intravenous  PONV Risk Score and Plan: 2 and Propofol infusion  Airway Management Planned: Natural Airway  Additional Equipment:   Intra-op Plan:   Post-operative Plan:   Informed Consent: I have reviewed the patients History and Physical, chart, labs and discussed  the procedure including the risks, benefits and alternatives for the proposed anesthesia with the patient or authorized representative who has indicated his/her understanding and acceptance.   Dental advisory given  Plan Discussed with: CRNA and Anesthesiologist  Anesthesia Plan Comments: (Patient consented for risks of anesthesia including but not limited to:  - adverse reactions to medications - risk of intubation if required - damage to teeth, lips or other oral mucosa - sore throat or hoarseness - Damage to heart, brain, lungs or loss of life  Patient voiced understanding.)        Anesthesia Quick Evaluation

## 2017-05-31 NOTE — Anesthesia Postprocedure Evaluation (Signed)
Anesthesia Post Note  Patient: [name]  Procedure(s) Performed: COLONOSCOPY WITH PROPOFOL (N/A )  Patient location during evaluation: Endoscopy Anesthesia Type: General Level of consciousness: awake and alert Pain management: pain level controlled Vital Signs Assessment: post-procedure vital signs reviewed and stable Respiratory status: spontaneous breathing, nonlabored ventilation, respiratory function stable and patient connected to nasal cannula oxygen Cardiovascular status: blood pressure returned to baseline and stable Postop Assessment: no apparent nausea or vomiting Anesthetic complications: no     Last Vitals:  Vitals:   05/31/17 0944 05/31/17 0954  BP: 98/70 128/82  Pulse: (!) 57 (!) 55  Resp: 12 17  Temp:    SpO2: 98% 96%    Last Pain:  Vitals:   05/31/17 0924  TempSrc: Tympanic  PainSc:                  Precious Haws Terrin Imparato

## 2017-05-31 NOTE — Transfer of Care (Signed)
Immediate Anesthesia Transfer of Care Note  Patient: [name]  Procedure(s) Performed: COLONOSCOPY WITH PROPOFOL (N/A )  Patient Location: PACU and Endoscopy Unit  Anesthesia Type:General  Level of Consciousness: awake  Airway & Oxygen Therapy: Patient Spontanous Breathing  Post-op Assessment: Report given to RN  Post vital signs: stable  Last Vitals:  Vitals:   05/31/17 0810  BP: (!) 157/76  Pulse: 64  Resp: 17  Temp: (!) 35.8 C  SpO2: 99%    Last Pain:  Vitals:   05/31/17 0810  TempSrc: Tympanic  PainSc: 4          Complications: No apparent anesthesia complications

## 2017-06-01 ENCOUNTER — Encounter: Payer: Self-pay | Admitting: Physical Therapy

## 2017-06-01 ENCOUNTER — Ambulatory Visit: Payer: PPO | Admitting: Physical Therapy

## 2017-06-01 DIAGNOSIS — E1143 Type 2 diabetes mellitus with diabetic autonomic (poly)neuropathy: Secondary | ICD-10-CM | POA: Diagnosis not present

## 2017-06-01 DIAGNOSIS — R262 Difficulty in walking, not elsewhere classified: Secondary | ICD-10-CM

## 2017-06-01 DIAGNOSIS — M6281 Muscle weakness (generalized): Secondary | ICD-10-CM

## 2017-06-01 DIAGNOSIS — M25552 Pain in left hip: Secondary | ICD-10-CM

## 2017-06-01 DIAGNOSIS — L97511 Non-pressure chronic ulcer of other part of right foot limited to breakdown of skin: Secondary | ICD-10-CM | POA: Diagnosis not present

## 2017-06-01 NOTE — Therapy (Signed)
[place] MAIN [facility] SERVICES [address] [place], [place], [address] Phone: [phone]   Fax:  [phone]  Physical Therapy Treatment  Patient Details  Name: [name] MRN: [id number] Date of Birth: [birth year] Referring Provider: Dr. Roland Rack   Encounter Date: 06/01/2017  PT End of Session - 06/01/17 1445    Visit Number  6    Number of Visits  17    Date for PT Re-Evaluation  06/26/17    PT Start Time  0130    PT Stop Time  0215    PT Time Calculation (min)  45 min    Equipment Utilized During Treatment  Gait belt    Activity Tolerance  Patient tolerated treatment well;Patient limited by pain;Patient limited by fatigue    Behavior During Therapy  [facility] for tasks assessed/performed       Past Medical History:  Diagnosis Date  . Abnormal antibody titer   . Anxiety and depression   . Arthritis   . Asthma   . Cystocele   . Depression   . Diabetes ([place])   . DVT (deep venous thrombosis) (HCC)    Righ calf  . Dysrhythmia   . Edema   . GERD (gastroesophageal reflux disease)   . Heart murmur    Echocardiogram June 2015: Mild MR (possible vegetation seen on TTE, not seen on TEE), normal LV size with moderate concentric LVH. Normal function EF 60-65%. Normal diastolic function. Mild LA dilation.  Marland Kitchen History of IBS   . History of methicillin resistant staphylococcus aureus (MRSA) 2008  . Hyperlipidemia   . Hypertension   . Hypothyroidism   . Insomnia   . Kidney stone    kidney stones with lithotripsy  . Neuropathy involving both lower extremities   . Obesity, Class II, BMI 35-39.9, with comorbidity   . Paroxysmal atrial fibrillation ([place]) 2007   In June 2015, Cardiac Event Monitor: Mostly SR/sinus arrhythmia with PVCs that are frequent. Short bursts of A. fib lasting several minutes;; CHA2DS2-VASc Score = 5 (age, Female, PVD, DM, HTN)  . Peripheral vascular disease ([place])   . Sleep apnea    use C-PAP  . Urinary incontinence   .  Venous stasis dermatitis of both lower extremities     Past Surgical History:  Procedure Laterality Date  . ANKLE SURGERY Right   . APPLICATION OF WOUND VAC Left 06/27/2015   Procedure: APPLICATION OF WOUND VAC ( POSSIBLE ) ;  Surgeon: [name], MD;  Location: [facility] ORS;  Service: Vascular;  Laterality: Left;  . APPLICATION OF WOUND VAC Left 09/25/2016   Procedure: APPLICATION OF WOUND VAC;  Surgeon: [name], DPM;  Location: [facility] ORS;  Service: Podiatry;  Laterality: Left;  . arm surgery     right    . CHOLECYSTECTOMY    . COLONOSCOPY    . COLONOSCOPY WITH PROPOFOL N/A 01/11/2017   Procedure: COLONOSCOPY WITH PROPOFOL;  Surgeon: [name], MD;  Location: [facility] ENDOSCOPY;  Service: Endoscopy;  Laterality: N/A;  . COLONOSCOPY WITH PROPOFOL N/A 02/22/2017   Procedure: COLONOSCOPY WITH PROPOFOL;  Surgeon: [name], MD;  Location: [facility] ENDOSCOPY;  Service: Endoscopy;  Laterality: N/A;  . ESOPHAGOGASTRODUODENOSCOPY (EGD) WITH PROPOFOL N/A 01/11/2017   Procedure: ESOPHAGOGASTRODUODENOSCOPY (EGD) WITH PROPOFOL;  Surgeon: [name], MD;  Location: [facility] ENDOSCOPY;  Service: Endoscopy;  Laterality: N/A;  . HERNIA REPAIR     umbilical  . HIATAL HERNIA REPAIR    . I&D EXTREMITY Left 06/27/2015  Procedure: IRRIGATION AND DEBRIDEMENT EXTREMITY            ( CALF HEMATOMA ) POSSIBLE WOUND VAC;  Surgeon: [name], MD;  Location: [facility] ORS;  Service: Vascular;  Laterality: Left;  . INCISION AND DRAINAGE OF WOUND Left 09/25/2016   Procedure: IRRIGATION AND DEBRIDEMENT - PARTIAL RESECTION OF ACHILLES TENDON WITH WOUND VAC APPLICATION;  Surgeon: [name], DPM;  Location: [facility] ORS;  Service: Podiatry;  Laterality: Left;  . IRRIGATION AND DEBRIDEMENT ABSCESS Left 09/07/2016   Procedure: IRRIGATION AND DEBRIDEMENT ABSCESS LEFT HEEL;  Surgeon: [name], DPM;  Location: [facility] ORS;  Service: Podiatry;  Laterality: Left;  . JOINT REPLACEMENT  2013   left knee  replacement  . KIDNEY SURGERY Right    kidney stones  . LITHOTRIPSY    . NM [facility]meralda [facility] HX)  February 2017   Likely breast attenuation. LOW RISK study. Normal EF 55-60%.  . OTHER SURGICAL HISTORY     08/2016 or 09/2016 surgery on achilles tenso h/o staph infection heal. Dr. [name]  . removal of left hematoma Left    leg  . REVERSE SHOULDER ARTHROPLASTY Right 10/08/2015   Procedure: REVERSE SHOULDER ARTHROPLASTY;  Surgeon: [name], MD;  Location: [facility] ORS;  Service: Orthopedics;  Laterality: Right;  . SLEEVE GASTROPLASTY    . TONSILLECTOMY    . TOTAL KNEE ARTHROPLASTY    . TOTAL SHOULDER REPLACEMENT Right   . TRANSESOPHAGEAL ECHOCARDIOGRAM  08/08/2013   Mild LVH, EF 60-65%. Moderate LA dilation and mild RA dilation. Mild MR with no evidence of stenosis and no evidence of endocarditis. A false chordae is noted.  . TRANSTHORACIC ECHOCARDIOGRAM  08/03/2013   Mild-Moderate concentric LVH, EF 60-65%. Normal diastolic function. Mild LA dilation. Mild MR with possible vegetation  - not confirmed on TEE   . ULNAR NERVE TRANSPOSITION Right 08/06/2016   Procedure: ULNAR NERVE DECOMPRESSION/TRANSPOSITION;  Surgeon: [name], MD;  Location: [facility] ORS;  Service: Orthopedics;  Laterality: Right;  . UPPER GI ENDOSCOPY    . VAGINAL HYSTERECTOMY      There were no vitals filed for this visit.  Subjective Assessment - 06/01/17 1442    Subjective  Patient has been having a lot of dairrhea and weakness. She had a colonoscopy yesterday. She is having a lot of difficulty getting up into her bed that is 36 inches high and she has a step to get up.     Patient is accompained by:  Family member    Pertinent History  Patient presents with hx of DM, PVD, peripheral neuropathy. In June 2018 patient suffered staph infection and subsequent necrosis of L Achilles tendon requiring partial resection. Patient has suffered three falls in last six months, including one where she felt a "pop" in the hip while  getting out of the bathtub. Went to [facility] two days later and received x-ray, negative; diagnosed with muscle strain and was recommended exercises. ER visit on 1/28 following third fall onto L hip, diagnosed with hematoma. Patient reports reduced sensation in feet L > R. Requires assistance to get into and out of bed since tendon surgery. Patient uses spc for ambulation and reports that small steps do not cause pain.    Limitations  Walking;Sitting;Standing;Lifting;House hold activities    How long can you sit comfortably?  1 hr    How long can you stand comfortably?  n/a    How long can you walk comfortably?  40 min    Diagnostic tests  x-ray, negative, CT scan showed torn muscle    Patient Stated Goals  Improve balance, reduce fall risk, and can go somewhere without FOF    Currently in Pain?  No/denies    Pain Score  0-No pain    Pain Onset  More than a month ago    Pain Onset  1 to 4 weeks ago       Therapeutic activities: Patient performed step ups with 6 inch stool, 8 inch stool with UE support using parallel bars, using side crutch and RW, using a bar in front x 20 : various amounts of assist from sBA to mod assist to ascend  Ascending / descending steps with crutch on left side, crutch on right side and opposite side using railing and SBA   Patient ambulates with RW 30 feet x 3 with CGA and left cast shoe   Patient has left walking cast shoe and has pain in right buttocks during steps ups with RLE. She has fatigue with BUE and BLE during mobility training.                      PT Education - 06/01/17 1444    Education provided  Yes    Education Details  HEP    Person(s) Educated  Patient    Methods  Explanation    Comprehension  Verbalized understanding       PT Short Term Goals - 04/26/17 1502      PT SHORT TERM GOAL #1   Title  Patient will be independent in home exercise program to improve strength/mobility for better functional independence with bed  mobility and ADLs.    Baseline  No HEP    Time  4    Period  Weeks    Status  New    Target Date  05/24/17      PT SHORT TERM GOAL #2   Title  Patient will report a worst pain score of 3/10 VAS in the L hip to improve tolerance with ADLs and reduce symptoms with bed mobility activities.      Baseline  6/10 with medication    Time  4    Period  Weeks    Status  New    Target Date  05/24/17      PT SHORT TERM GOAL #3   Title  Patient will demonstrate sit to supine bed mobility, supine to sit bed mobility, and rolling L and R with minimal assistance for improved functional mobility and independence    Baseline  Patient requires mod-max A for bed mobility    Time  4    Period  Weeks    Status  New    Target Date  05/24/17        PT Long Term Goals - 05/25/17 1317      PT LONG TERM GOAL #1   Title  Patient (>101 years old) will improve 10-meter walk test to 0.6 meters/sec with least restrictive AD to demonstrate reduced fall risk and capacity for limited community ambulation.    Baseline  0.21 meters/sec, 05/25/17 = .21 m/sec    Time  8    Period  Weeks    Status  On-going    Target Date  06/21/17      PT LONG TERM GOAL #2   Title  Patient will improve LEFS score to 40/80 to demonstrate improved functional mobility and independence with ADLs    Baseline  9/80; 05/25/17= 12/80  Time  8    Period  Weeks    Status  On-going    Target Date  06/21/17      PT LONG TERM GOAL #3   Title  Patient will demonstrate ability to perform bed mobility independently for improved functional mobility and independence.    Baseline  Patient requires mod-max A for bed mobility, supine to sit with min asssit, and sit to supine with mod assist     Time  8    Period  Weeks    Status  On-going    Target Date  06/21/17            Plan - 06/01/17 1446    Clinical Impression Statement  Patient performed steps of different heights today , leading wiht right and left LE to see which leg she was  safer with . She was challenged by 6 inches and 8 inches and tried to ascend with different ways to support her UE's. She used side AD  to push up from and front AD to push up from . She ascended and descended steps with 1 rail and crutche on left side and crutch on right side. She was given other ideas to try getting up the steps of her home and into her bed. She is going to measure the step  heights and bed heights for nex visit. She will continue to benefit from skilled pt to improve mobility.     Rehab Potential  Fair    Clinical Impairments Affecting Rehab Potential  - comorbidities, age, severity / + family support, acute injury    PT Frequency  2x / week    PT Duration  8 weeks    PT Treatment/Interventions  Aquatic Therapy;ADLs/Self Care Home Management;Cryotherapy;Electrical Stimulation;Moist Heat;Ultrasound;DME Instruction;Gait training;Stair training;Functional mobility training;Therapeutic activities;Therapeutic exercise;Balance training;Neuromuscular re-education;Patient/family education;Manual techniques;Compression bandaging;Scar mobilization;Passive range of motion;Dry needling;Energy conservation;Taping    PT Next Visit Plan  LE strengthening    Consulted and Agree with Plan of Care  Patient       Patient will benefit from skilled therapeutic intervention in order to improve the following deficits and impairments:  Abnormal gait, Decreased activity tolerance, Decreased balance, Decreased endurance, Decreased coordination, Decreased mobility, Decreased range of motion, Decreased safety awareness, Difficulty walking, Decreased strength, Hypomobility, Impaired perceived functional ability, Impaired UE functional use, Obesity, Impaired flexibility, Pain  Visit Diagnosis: Pain in left hip  Difficulty in walking, not elsewhere classified  Muscle weakness (generalized)     Problem List Patient Active Problem List   Diagnosis Date Noted  . Mass of both adrenal glands ([place])  05/16/2017  . Fatty liver 05/13/2017  . Pain of left hip joint 04/22/2017  . Abnormality of gait and mobility 04/22/2017  . Fall with injury 02/02/2017  . Anemia 11/18/2016  . Asthma 11/18/2016  . Carpal tunnel syndrome 11/18/2016  . Pain in joint involving ankle and foot 11/18/2016  . Obstructive sleep apnea of adult 11/18/2016  . Spinal stenosis of thoracic region 11/18/2016  . Abscess of tendon of left foot 09/21/2016  . Cubital tunnel syndrome on right 08/07/2016  . Chronic pain syndrome 02/25/2016  . Long term current use of opiate analgesic 02/25/2016  . Chronic right shoulder pain 02/25/2016  . Status post reverse total shoulder replacement 10/08/2015  . 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 Morbid obesity with BMI of 40.0-44.9, adult ([place]) 09/23/2015  . Chronic prescription benzodiazepine use 09/23/2015  . Osteoarthritis, multiple sites 09/23/2015  . Other  shoulder lesions ([place]) 08/28/2015  . Cervical radiculitis 08/06/2015  . Opiate use (15 MME/Day) 01/16/2015  . Impingement syndrome of shoulder region 01/07/2015  . Mixed incontinence 11/26/2014  . Atrophic vaginitis 11/26/2014  . Anxiety and depression 09/21/2014  . Bladder cystocele 09/21/2014  . Gastro-esophageal reflux disease without esophagitis 09/21/2014  . DM (diabetes mellitus) type II, controlled, with peripheral vascular disorder ([place]) 08/31/2014  . Diabetic peripheral neuropathy associated with type 2 diabetes mellitus ([place]) 08/31/2014  . Asthma, moderate persistent, well-controlled 08/31/2014  . Hypothyroidism, adult 08/31/2014  . Peripheral vascular disease due to secondary diabetes mellitus ([place]) 12/11/2013  . Apnea, sleep 10/19/2013  . Paroxysmal atrial fibrillation ([place]); CHA2DS2-Vasc Score 5. On Pradaxa 07/27/2013  . Essential hypertension 07/27/2013    Alanson Puls, [place] DPT 06/01/2017, 2:51 PM  [place] MAIN [facility]  SERVICES [address] [place], [place], [address] Phone: [phone]   Fax:  [phone]  Name: Doriann Zuch MRN: [id number] Date of Birth: April 25, 1950

## 2017-06-02 DIAGNOSIS — L0390 Cellulitis, unspecified: Secondary | ICD-10-CM | POA: Diagnosis not present

## 2017-06-02 DIAGNOSIS — G4733 Obstructive sleep apnea (adult) (pediatric): Secondary | ICD-10-CM | POA: Diagnosis not present

## 2017-06-03 ENCOUNTER — Encounter: Payer: Self-pay | Admitting: Physical Therapy

## 2017-06-03 ENCOUNTER — Ambulatory Visit: Payer: PPO | Admitting: Physical Therapy

## 2017-06-03 DIAGNOSIS — M6281 Muscle weakness (generalized): Secondary | ICD-10-CM

## 2017-06-03 DIAGNOSIS — M25552 Pain in left hip: Secondary | ICD-10-CM

## 2017-06-03 DIAGNOSIS — R262 Difficulty in walking, not elsewhere classified: Secondary | ICD-10-CM

## 2017-06-03 NOTE — Therapy (Signed)
[place] [facility] [address] [place], [place], [address] Phone: [phone]   Fax:  [phone]  Physical Therapy Treatment  Patient Details  Name: [name] MRN: [id number] Date of Birth: [birth year] Referring Provider: Dr. Roland Rack   Encounter Date: 06/03/2017  PT End of Session - 06/03/17 1314    Visit Number  7    Number of Visits  17    Date for PT Re-Evaluation  06/26/17    PT Start Time  0105    PT Stop Time  0145    PT Time Calculation (min)  40 min    Equipment Utilized During Treatment  Gait belt    Activity Tolerance  Patient tolerated treatment well;Patient limited by pain;Patient limited by fatigue    Behavior During Therapy  [facility] for tasks assessed/performed       Past Medical History:  Diagnosis Date  . Abnormal antibody titer   . Anxiety and depression   . Arthritis   . Asthma   . Cystocele   . Depression   . Diabetes ([place])   . DVT (deep venous thrombosis) (HCC)    Righ calf  . Dysrhythmia   . Edema   . GERD (gastroesophageal reflux disease)   . Heart murmur    Echocardiogram June 2015: Mild MR (possible vegetation seen on TTE, not seen on TEE), normal LV size with moderate concentric LVH. Normal function EF 60-65%. Normal diastolic function. Mild LA dilation.  Marland Kitchen History of IBS   . History of methicillin resistant staphylococcus aureus (MRSA) 2008  . Hyperlipidemia   . Hypertension   . Hypothyroidism   . Insomnia   . Kidney stone    kidney stones with lithotripsy  . Neuropathy involving both lower extremities   . Obesity, Class II, BMI 35-39.9, with comorbidity   . Paroxysmal atrial fibrillation ([place]) 2007   In June 2015, Cardiac Event Monitor: Mostly SR/sinus arrhythmia with PVCs that are frequent. Short bursts of A. fib lasting several minutes;; CHA2DS2-VASc Score = 5 (age, Female, PVD, DM, HTN)  . Peripheral vascular disease ([place])   . Sleep apnea    use C-PAP  . Urinary incontinence   .  Venous stasis dermatitis of both lower extremities     Past Surgical History:  Procedure Laterality Date  . ANKLE SURGERY Right   . APPLICATION OF WOUND VAC Left 06/27/2015   Procedure: APPLICATION OF WOUND VAC ( POSSIBLE ) ;  Surgeon: [name], MD;  Location: [facility] ORS;  Service: Vascular;  Laterality: Left;  . APPLICATION OF WOUND VAC Left 09/25/2016   Procedure: APPLICATION OF WOUND VAC;  Surgeon: [name], DPM;  Location: [facility] ORS;  Service: Podiatry;  Laterality: Left;  . arm surgery     right    . CHOLECYSTECTOMY    . COLONOSCOPY    . COLONOSCOPY WITH PROPOFOL N/A 01/11/2017   Procedure: COLONOSCOPY WITH PROPOFOL;  Surgeon: [name], MD;  Location: [facility] ENDOSCOPY;  Service: Endoscopy;  Laterality: N/A;  . COLONOSCOPY WITH PROPOFOL N/A 02/22/2017   Procedure: COLONOSCOPY WITH PROPOFOL;  Surgeon: [name], MD;  Location: [facility] ENDOSCOPY;  Service: Endoscopy;  Laterality: N/A;  . COLONOSCOPY WITH PROPOFOL N/A 05/31/2017   Procedure: COLONOSCOPY WITH PROPOFOL;  Surgeon: [name], MD;  Location: [facility] ENDOSCOPY;  Service: Endoscopy;  Laterality: N/A;  . ESOPHAGOGASTRODUODENOSCOPY (EGD) WITH PROPOFOL N/A 01/11/2017   Procedure: ESOPHAGOGASTRODUODENOSCOPY (EGD) WITH PROPOFOL;  Surgeon: [name], MD;  Location: [facility]  ENDOSCOPY;  Service: Endoscopy;  Laterality: N/A;  . HERNIA REPAIR     umbilical  . HIATAL HERNIA REPAIR    . I&D EXTREMITY Left 06/27/2015   Procedure: IRRIGATION AND DEBRIDEMENT EXTREMITY            ( CALF HEMATOMA ) POSSIBLE WOUND VAC;  Surgeon: [name], MD;  Location: [facility] ORS;  Service: Vascular;  Laterality: Left;  . INCISION AND DRAINAGE OF WOUND Left 09/25/2016   Procedure: IRRIGATION AND DEBRIDEMENT - PARTIAL RESECTION OF ACHILLES TENDON WITH WOUND VAC APPLICATION;  Surgeon: [name], DPM;  Location: [facility] ORS;  Service: Podiatry;  Laterality: Left;  . IRRIGATION AND DEBRIDEMENT ABSCESS Left 09/07/2016   Procedure:  IRRIGATION AND DEBRIDEMENT ABSCESS LEFT HEEL;  Surgeon: [name], DPM;  Location: [facility] ORS;  Service: Podiatry;  Laterality: Left;  . JOINT REPLACEMENT  2013   left knee replacement  . KIDNEY SURGERY Right    kidney stones  . LITHOTRIPSY    . NM [facility] HX)  February 2017   Likely breast attenuation. LOW RISK study. Normal EF 55-60%.  . OTHER SURGICAL HISTORY     08/2016 or 09/2016 surgery on achilles tenso h/o staph infection heal. Dr. [name]  . removal of left hematoma Left    leg  . REVERSE SHOULDER ARTHROPLASTY Right 10/08/2015   Procedure: REVERSE SHOULDER ARTHROPLASTY;  Surgeon: [name], MD;  Location: [facility] ORS;  Service: Orthopedics;  Laterality: Right;  . SLEEVE GASTROPLASTY    . TONSILLECTOMY    . TOTAL KNEE ARTHROPLASTY    . TOTAL SHOULDER REPLACEMENT Right   . TRANSESOPHAGEAL ECHOCARDIOGRAM  08/08/2013   Mild LVH, EF 60-65%. Moderate LA dilation and mild RA dilation. Mild MR with no evidence of stenosis and no evidence of endocarditis. A false chordae is noted.  . TRANSTHORACIC ECHOCARDIOGRAM  08/03/2013   Mild-Moderate concentric LVH, EF 60-65%. Normal diastolic function. Mild LA dilation. Mild MR with possible vegetation  - not confirmed on TEE   . ULNAR NERVE TRANSPOSITION Right 08/06/2016   Procedure: ULNAR NERVE DECOMPRESSION/TRANSPOSITION;  Surgeon: [name], MD;  Location: [facility] ORS;  Service: Orthopedics;  Laterality: Right;  . UPPER GI ENDOSCOPY    . VAGINAL HYSTERECTOMY      There were no vitals filed for this visit.  Subjective Assessment - 06/03/17 1312    Subjective  Patient has been having a lot of dairrhea and weakness. She had a colonoscopy yesterday. She is having a lot of difficulty getting up into her bed that is 36 inches high and she has a step to get up.     Patient is accompained by:  Family member    Pertinent History  Patient presents with hx of DM, PVD, peripheral neuropathy. In June 2018 patient suffered staph infection  and subsequent necrosis of L Achilles tendon requiring partial resection. Patient has suffered three falls in last six months, including one where she felt a "pop" in the hip while getting out of the bathtub. Went to MD two days later and received x-ray, negative; diagnosed with muscle strain and was recommended exercises. ER visit on 1/28 following third fall onto L hip, diagnosed with hematoma. Patient reports reduced sensation in feet L > R. Requires assistance to get into and out of bed since tendon surgery. Patient uses spc for ambulation and reports that small steps do not cause pain.    Limitations  Walking;Sitting;Standing;Lifting;House hold activities    How long can you sit comfortably?  1 hr    How long can you stand comfortably?  n/a    How long can you walk comfortably?  40 min    Diagnostic tests  x-ray, negative, CT scan showed torn muscle    Patient Stated Goals  Improve balance, reduce fall risk, and can go somewhere without FOF    Currently in Pain?  Yes    Pain Score  7     Pain Location  Buttocks    Pain Orientation  Right    Pain Descriptors / Indicators  Aching    Pain Type  Chronic pain    Pain Onset  More than a month ago    Pain Frequency  Constant    Aggravating Factors   sit ti stand makes it worse    Pain Relieving Factors  rest, medications    Effect of Pain on Daily Activities  unable to walk as far    Multiple Pain Sites  No    Pain Onset  1 to 4 weeks ago       Treatment: Stepping up to 6 inch stool, leading with RLE with crutch in left UE and use of rollator on RUE ; practiced fwd step x 2 reps, attempted bwd step but she is not able to due to right buttock pain.   Therapeutic exercise: SAQ BLE x 10 x 2 with 2 lbs LAQ BLE x 10 x 2  SLR BLE x 10 x 2 Heel slides x 10 x 2 BLE Hip abd /add x 10 x 2 BLE  Seated hip flex x 10 BLE Patient needs CGA for supine to sit bed mobility .    CGA and Min to mod verbal cues used throughout with assist for mobility  listed above.                 PT Education - 06/03/17 1314    Education provided  Yes    Education Details  HEP    Person(s) Educated  Patient    Methods  Explanation;Demonstration;Tactile cues    Comprehension  Verbalized understanding;Returned demonstration       PT Short Term Goals - 04/26/17 1502      PT SHORT TERM GOAL #1   Title  Patient will be independent in home exercise program to improve strength/mobility for better functional independence with bed mobility and ADLs.    Baseline  No HEP    Time  4    Period  Weeks    Status  New    Target Date  05/24/17      PT SHORT TERM GOAL #2   Title  Patient will report a worst pain score of 3/10 VAS in the L hip to improve tolerance with ADLs and reduce symptoms with bed mobility activities.      Baseline  6/10 with medication    Time  4    Period  Weeks    Status  New    Target Date  05/24/17      PT SHORT TERM GOAL #3   Title  Patient will demonstrate sit to supine bed mobility, supine to sit bed mobility, and rolling L and R with minimal assistance for improved functional mobility and independence    Baseline  Patient requires mod-max A for bed mobility    Time  4    Period  Weeks    Status  New    Target Date  05/24/17        PT Long  Term Goals - 05/25/17 1317      PT LONG TERM GOAL #1   Title  Patient (>12 years old) will improve 10-meter walk test to 0.6 meters/sec with least restrictive AD to demonstrate reduced fall risk and capacity for limited community ambulation.    Baseline  0.21 meters/sec, 05/25/17 = .21 m/sec    Time  8    Period  Weeks    Status  On-going    Target Date  06/21/17      PT LONG TERM GOAL #2   Title  Patient will improve LEFS score to 40/80 to demonstrate improved functional mobility and independence with ADLs    Baseline  9/80; 05/25/17= 12/80    Time  8    Period  Weeks    Status  On-going    Target Date  06/21/17      PT LONG TERM GOAL #3   Title  Patient will  demonstrate ability to perform bed mobility independently for improved functional mobility and independence.    Baseline  Patient requires mod-max A for bed mobility, supine to sit with min asssit, and sit to supine with mod assist     Time  8    Period  Weeks    Status  On-going    Target Date  06/21/17            Plan - 06/03/17 1315    Clinical Impression Statement  Patient performs several ways to get up on one step to be able to get up on the step in her bedroom to get into her bed. She uses one side of the walker for support and a crutch to push up on. She performs supine exercises for strengthening and is able to progress her exercises. She has right buttocks pain during all movements with LLE or RLE. She will contiinue to benefit from skilled PT to improve mobiity and strength.     Rehab Potential  Fair    Clinical Impairments Affecting Rehab Potential  - comorbidities, age, severity / + family support, acute injury    PT Frequency  2x / week    PT Duration  8 weeks    PT Treatment/Interventions  Aquatic Therapy;ADLs/Self Care Home Management;Cryotherapy;Electrical Stimulation;Moist Heat;Ultrasound;DME Instruction;Gait training;Stair training;Functional mobility training;Therapeutic activities;Therapeutic exercise;Balance training;Neuromuscular re-education;Patient/family education;Manual techniques;Compression bandaging;Scar mobilization;Passive range of motion;Dry needling;Energy conservation;Taping    PT Next Visit Plan  LE strengthening    Consulted and Agree with Plan of Care  Patient       Patient will benefit from skilled therapeutic intervention in order to improve the following deficits and impairments:  Abnormal gait, Decreased activity tolerance, Decreased balance, Decreased endurance, Decreased coordination, Decreased mobility, Decreased range of motion, Decreased safety awareness, Difficulty walking, Decreased strength, Hypomobility, Impaired perceived functional  ability, Impaired UE functional use, Obesity, Impaired flexibility, Pain  Visit Diagnosis: Pain in left hip  Difficulty in walking, not elsewhere classified  Muscle weakness (generalized)     Problem List Patient Active Problem List   Diagnosis Date Noted  . Mass of both adrenal glands ([place]) 05/16/2017  . Fatty liver 05/13/2017  . Pain of left hip joint 04/22/2017  . Abnormality of gait and mobility 04/22/2017  . Fall with injury 02/02/2017  . Anemia 11/18/2016  . Asthma 11/18/2016  . Carpal tunnel syndrome 11/18/2016  . Pain in joint involving ankle and foot 11/18/2016  . Obstructive sleep apnea of adult 11/18/2016  . Spinal stenosis of thoracic region 11/18/2016  . Abscess of  tendon of left foot 09/21/2016  . Cubital tunnel syndrome on right 08/07/2016  . Chronic pain syndrome 02/25/2016  . Long term current use of opiate analgesic 02/25/2016  . Chronic right shoulder pain 02/25/2016  . Status post reverse total shoulder replacement 10/08/2015  . 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 Morbid obesity with BMI of 40.0-44.9, adult ([place]) 09/23/2015  . Chronic prescription benzodiazepine use 09/23/2015  . Osteoarthritis, multiple sites 09/23/2015  . Other shoulder lesions (Right) 08/28/2015  . Cervical radiculitis 08/06/2015  . Opiate use (15 MME/Day) 01/16/2015  . Impingement syndrome of shoulder region 01/07/2015  . Mixed incontinence 11/26/2014  . Atrophic vaginitis 11/26/2014  . Anxiety and depression 09/21/2014  . Bladder cystocele 09/21/2014  . Gastro-esophageal reflux disease without esophagitis 09/21/2014  . DM (diabetes mellitus) type II, controlled, with peripheral vascular disorder ([place]) 08/31/2014  . Diabetic peripheral neuropathy associated with type 2 diabetes mellitus ([place]) 08/31/2014  . Asthma, moderate persistent, well-controlled 08/31/2014  . Hypothyroidism, adult 08/31/2014  . Peripheral vascular disease due to secondary  diabetes mellitus ([place]) 12/11/2013  . Apnea, sleep 10/19/2013  . Paroxysmal atrial fibrillation ([place]); CHA2DS2-Vasc Score 5. On Pradaxa 07/27/2013  . Essential hypertension 07/27/2013    Alanson Puls, [place] DPT 06/03/2017, 1:54 PM  [place] [facility] SERVICES [address] [place], [place], [address] Phone: [phone]   Fax:  [phone]  Name: [name] MRN: [id number] Date of Birth: 14-Sep-1950

## 2017-06-04 LAB — SURGICAL PATHOLOGY

## 2017-06-08 ENCOUNTER — Ambulatory Visit (INDEPENDENT_AMBULATORY_CARE_PROVIDER_SITE_OTHER): Payer: PPO | Admitting: Internal Medicine

## 2017-06-08 ENCOUNTER — Encounter: Payer: Self-pay | Admitting: Internal Medicine

## 2017-06-08 ENCOUNTER — Ambulatory Visit: Payer: PPO | Admitting: Physical Therapy

## 2017-06-08 VITALS — BP 128/78 | HR 58 | Temp 97.8°F | Wt 255.0 lb

## 2017-06-08 DIAGNOSIS — N3001 Acute cystitis with hematuria: Secondary | ICD-10-CM | POA: Diagnosis not present

## 2017-06-08 DIAGNOSIS — R300 Dysuria: Secondary | ICD-10-CM | POA: Diagnosis not present

## 2017-06-08 DIAGNOSIS — R310 Gross hematuria: Secondary | ICD-10-CM

## 2017-06-08 DIAGNOSIS — N3289 Other specified disorders of bladder: Secondary | ICD-10-CM

## 2017-06-08 LAB — POC URINALSYSI DIPSTICK (AUTOMATED)
Glucose, UA: NEGATIVE
NITRITE UA: NEGATIVE
PH UA: 6 (ref 5.0–8.0)
Spec Grav, UA: >=1.03 — AB (ref 1.010–1.025)
UROBILINOGEN UA: 0.2 U/dL

## 2017-06-08 MED ORDER — NITROFURANTOIN MONOHYD MACRO 100 MG PO CAPS: 100.0000 mg | ORAL_CAPSULE | Freq: Two times a day (BID) | ORAL | 0 refills | Status: DC

## 2017-06-08 NOTE — Progress Notes (Signed)
HPI  Pt presents to the clinic today with c/o dysuria and hematuria. This started 4 days ago. The dysuria is intermittent. She has noticed blood in her urine and been having bladder spasms as well. She denies fever, chills, nausea or low back pain. She denies vaginal complaints. She has not tried anything OTC for her symptoms.     Review of Systems  Past Medical History:  Diagnosis Date  . Abnormal antibody titer   . Anxiety and depression   . Arthritis   . Asthma   . Cystocele   . Depression   . Diabetes ([place])   . DVT (deep venous thrombosis) (HCC)    Righ calf  . Dysrhythmia   . Edema   . GERD (gastroesophageal reflux disease)   . Heart murmur    Echocardiogram June 2015: Mild MR (possible vegetation seen on TTE, not seen on TEE), normal LV size with moderate concentric LVH. Normal function EF 60-65%. Normal diastolic function. Mild LA dilation.  Marland Kitchen History of IBS   . History of methicillin resistant staphylococcus aureus (MRSA) 2008  . Hyperlipidemia   . Hypertension   . Hypothyroidism   . Insomnia   . Kidney stone    kidney stones with lithotripsy  . Neuropathy involving both lower extremities   . Obesity, Class II, BMI 35-39.9, with comorbidity   . Paroxysmal atrial fibrillation ([place]) 2007   In June 2015, Cardiac Event Monitor: Mostly SR/sinus arrhythmia with PVCs that are frequent. Short bursts of A. fib lasting several minutes;; CHA2DS2-VASc Score = 5 (age, Female, PVD, DM, HTN)  . Peripheral vascular disease ([place])   . Sleep apnea    use C-PAP  . Urinary incontinence   . Venous stasis dermatitis of both lower extremities     Family History  Problem Relation Age of Onset  . Skin cancer Father   . Diabetes Father   . Hypertension Father   . Peripheral vascular disease Father   . Cancer Father   . Cerebral aneurysm Father   . Alcohol abuse Father   . Varicose Veins Mother   . Kidney disease Mother   . Arthritis Mother   . Mental illness Sister   . Cancer  Maternal Aunt        breast  . Breast cancer Maternal Aunt   . Arthritis Maternal Grandmother   . Hypertension Maternal Grandmother   . Diabetes Maternal Grandmother   . Arthritis Maternal Grandfather   . Heart disease Maternal Grandfather   . Hypertension Maternal Grandfather   . Arthritis Paternal Grandmother   . Hypertension Paternal Grandmother   . Diabetes Paternal Grandmother   . Arthritis Paternal Grandfather   . Hypertension Paternal Grandfather   . Bladder Cancer Neg Hx   . Kidney cancer Neg Hx     Social History   Socioeconomic History  . Marital status: Married    Spouse name: Not on file  . Number of children: Not on file  . Years of education: Not on file  . Highest education level: Not on file  Social Needs  . Financial resource strain: Not on file  . Food insecurity - worry: Not on file  . Food insecurity - inability: Not on file  . Transportation needs - medical: Not on file  . Transportation needs - non-medical: Not on file  Occupational History  . Not on file  Tobacco Use  . Smoking status: Never Smoker  . Smokeless tobacco: Never Used  Substance and Sexual Activity  .  Alcohol use: No    Alcohol/week: 0.0 oz  . Drug use: No  . Sexual activity: No  Other Topics Concern  . Not on file  Social History Narrative   She is currently married -- for 28 years. Does not work. Does not smoke or take alcohol. She never smoked. She exercises at least 3 days a week since before her gastric surgery.   Marital status reviewed in history of present illness.    Allergies  Allergen Reactions  . Penicillins Hives, Shortness Of Breath and Swelling    Facial swelling Has patient had a PCN reaction causing immediate rash, facial/tongue/throat swelling, SOB or lightheadedness with hypotension: Yes Has patient had a PCN reaction causing severe rash involving mucus membranes or skin necrosis: Yes Has patient had a PCN reaction that required hospitalization No Has  patient had a PCN reaction occurring within the last 10 years: No If all of the above answers are "NO", then may proceed with Cephalosporin use.   . Aspirin Hives  . Morphine And Related Other (See Comments)    Patient becomes very confused  . Terramycin [Oxytetracycline] Hives     Constitutional: Denies fever, malaise, fatigue, headache or abrupt weight changes.   GU: Pt reports bladder spasms,pain with urination and blood in urine. Denies burning sensation, odor or discharge.   No other specific complaints in a complete review of systems (except as listed in HPI above).    Objective:   Physical Exam  BP 128/78   Pulse (!) 58   Temp 97.8 F (36.6 C) (Oral)   Wt 255 lb (115.7 kg)   SpO2 100%   BMI 42.43 kg/m   Wt Readings from Last 3 Encounters:  06/08/17 255 lb (115.7 kg)  05/31/17 251 lb (113.9 kg)  05/25/17 251 lb 12 oz (114.2 kg)    General: Appears her stated age, obese in NAD. Cardiovascular: Normal rate and rhythm. S1,S2 noted.   Abdomen: Soft. Normal bowel sounds. No distention or masses noted.  Tender to palpation over the bladder area. No CVA tenderness.       Assessment & Plan:   Dysuria, Bladder Spasms, Blood in Urine secondary to UTI:  Urinalysis: 3+ leuks, 3+ blood Will send urine culture eRx sent if for Macrobid 100 mg BID x 5 days OK to take AZO OTC Drink plenty of fluids  RTC as needed or if symptoms persist. Webb Silversmith, NP

## 2017-06-08 NOTE — Patient Instructions (Signed)
Urinary Tract Infection, Adult °A urinary tract infection (UTI) is an infection of any part of the urinary tract. The urinary tract includes the: °· Kidneys. °· Ureters. °· Bladder. °· Urethra. ° °These organs make, store, and get rid of pee (urine) in the body. °Follow these instructions at home: °· Take over-the-counter and prescription medicines only as told by your doctor. °· If you were prescribed an antibiotic medicine, take it as told by your doctor. Do not stop taking the antibiotic even if you start to feel better. °· Avoid the following drinks: °? Alcohol. °? Caffeine. °? Tea. °? Carbonated drinks. °· Drink enough fluid to keep your pee clear or pale yellow. °· Keep all follow-up visits as told by your doctor. This is important. °· Make sure to: °? Empty your bladder often and completely. Do not to hold pee for long periods of time. °? Empty your bladder before and after sex. °? Wipe from front to back after a bowel movement if you are female. Use each tissue one time when you wipe. °Contact a doctor if: °· You have back pain. °· You have a fever. °· You feel sick to your stomach (nauseous). °· You throw up (vomit). °· Your symptoms do not get better after 3 days. °· Your symptoms go away and then come back. °Get help right away if: °· You have very bad back pain. °· You have very bad lower belly (abdominal) pain. °· You are throwing up and cannot keep down any medicines or water. °This information is not intended to replace advice given to you by your health care provider. Make sure you discuss any questions you have with your health care provider. °Document Released: 08/26/2007 Document Revised: 08/15/2015 Document Reviewed: 01/28/2015 °Elsevier Interactive Patient Education © 2018 [employer] ° °

## 2017-06-09 DIAGNOSIS — L97511 Non-pressure chronic ulcer of other part of right foot limited to breakdown of skin: Secondary | ICD-10-CM | POA: Diagnosis not present

## 2017-06-09 DIAGNOSIS — E1143 Type 2 diabetes mellitus with diabetic autonomic (poly)neuropathy: Secondary | ICD-10-CM | POA: Diagnosis not present

## 2017-06-10 LAB — URINE CULTURE
MICRO NUMBER:: 90345092
SPECIMEN QUALITY:: ADEQUATE

## 2017-06-11 ENCOUNTER — Other Ambulatory Visit: Payer: Self-pay | Admitting: Internal Medicine

## 2017-06-11 MED ORDER — LEVOFLOXACIN 250 MG PO TABS: 250.0000 mg | ORAL_TABLET | Freq: Every day | ORAL | 0 refills | Status: DC

## 2017-06-14 ENCOUNTER — Ambulatory Visit (INDEPENDENT_AMBULATORY_CARE_PROVIDER_SITE_OTHER): Payer: PPO

## 2017-06-14 DIAGNOSIS — I739 Peripheral vascular disease, unspecified: Secondary | ICD-10-CM

## 2017-06-15 ENCOUNTER — Ambulatory Visit: Payer: PPO

## 2017-06-15 VITALS — BP 135/68 | HR 60

## 2017-06-15 DIAGNOSIS — M25552 Pain in left hip: Secondary | ICD-10-CM | POA: Diagnosis not present

## 2017-06-15 DIAGNOSIS — M6281 Muscle weakness (generalized): Secondary | ICD-10-CM

## 2017-06-15 DIAGNOSIS — R262 Difficulty in walking, not elsewhere classified: Secondary | ICD-10-CM

## 2017-06-15 NOTE — Therapy (Signed)
[place] MAIN [facility] SERVICES [address] [place], [place], [address] Phone: [phone]   Fax:  [phone]  Physical Therapy Treatment  Patient Details  Name: [name] MRN: [id number] Date of Birth: 07/20/1950 Referring Provider: Dr. Roland Rack   Encounter Date: 06/15/2017  PT End of Session - 06/15/17 1424    Visit Number  8    Number of Visits  17    Date for PT Re-Evaluation  06/26/17    PT Start Time  4403    PT Stop Time  1430    PT Time Calculation (min)  43 min    Equipment Utilized During Treatment  Gait belt    Activity Tolerance  Patient tolerated treatment well;Patient limited by pain;Patient limited by fatigue    Behavior During Therapy  [facility] for tasks assessed/performed       Past Medical History:  Diagnosis Date  . Abnormal antibody titer   . Anxiety and depression   . Arthritis   . Asthma   . Cystocele   . Depression   . Diabetes ([place])   . DVT (deep venous thrombosis) (HCC)    Righ calf  . Dysrhythmia   . Edema   . GERD (gastroesophageal reflux disease)   . Heart murmur    Echocardiogram June 2015: Mild MR (possible vegetation seen on TTE, not seen on TEE), normal LV size with moderate concentric LVH. Normal function EF 60-65%. Normal diastolic function. Mild LA dilation.  Marland Kitchen History of IBS   . History of methicillin resistant staphylococcus aureus (MRSA) 2008  . Hyperlipidemia   . Hypertension   . Hypothyroidism   . Insomnia   . Kidney stone    kidney stones with lithotripsy  . Neuropathy involving both lower extremities   . Obesity, Class II, BMI 35-39.9, with comorbidity   . Paroxysmal atrial fibrillation ([place]) 2007   In June 2015, Cardiac Event Monitor: Mostly SR/sinus arrhythmia with PVCs that are frequent. Short bursts of A. fib lasting several minutes;; CHA2DS2-VASc Score = 5 (age, Female, PVD, DM, HTN)  . Peripheral vascular disease ([place] [address])   . Sleep apnea    use C-PAP  . Urinary incontinence   .  Venous stasis dermatitis of both lower extremities     Past Surgical History:  Procedure Laterality Date  . ANKLE SURGERY Right   . APPLICATION OF WOUND VAC Left 06/27/2015   Procedure: APPLICATION OF WOUND VAC ( POSSIBLE ) ;  Surgeon: [name], MD;  Location: [facility] ORS;  Service: Vascular;  Laterality: Left;  . APPLICATION OF WOUND VAC Left 09/25/2016   Procedure: APPLICATION OF WOUND VAC;  Surgeon: [name], DPM;  Location: [facility] ORS;  Service: Podiatry;  Laterality: Left;  . arm surgery     right    . CHOLECYSTECTOMY    . COLONOSCOPY    . COLONOSCOPY WITH PROPOFOL N/A 01/11/2017   Procedure: COLONOSCOPY WITH PROPOFOL;  Surgeon: [name], MD;  Location: [facility] ENDOSCOPY;  Service: Endoscopy;  Laterality: N/A;  . COLONOSCOPY WITH PROPOFOL N/A 02/22/2017   Procedure: COLONOSCOPY WITH PROPOFOL;  Surgeon: [name], MD;  Location: [facility] ENDOSCOPY;  Service: Endoscopy;  Laterality: N/A;  . COLONOSCOPY WITH PROPOFOL N/A 05/31/2017   Procedure: COLONOSCOPY WITH PROPOFOL;  Surgeon: [name], MD;  Location: [facility] ENDOSCOPY;  Service: Endoscopy;  Laterality: N/A;  . ESOPHAGOGASTRODUODENOSCOPY (EGD) WITH PROPOFOL N/A 01/11/2017   Procedure: ESOPHAGOGASTRODUODENOSCOPY (EGD) WITH PROPOFOL;  Surgeon: [name], MD;  Location: [facility]  ENDOSCOPY;  Service: Endoscopy;  Laterality: N/A;  . HERNIA REPAIR     umbilical  . HIATAL HERNIA REPAIR    . I&D EXTREMITY Left 06/27/2015   Procedure: IRRIGATION AND DEBRIDEMENT EXTREMITY            ( CALF HEMATOMA ) POSSIBLE WOUND VAC;  Surgeon: [name], MD;  Location: [facility] ORS;  Service: Vascular;  Laterality: Left;  . INCISION AND DRAINAGE OF WOUND Left 09/25/2016   Procedure: IRRIGATION AND DEBRIDEMENT - PARTIAL RESECTION OF ACHILLES TENDON WITH WOUND VAC APPLICATION;  Surgeon: [name], DPM;  Location: [facility] ORS;  Service: Podiatry;  Laterality: Left;  . IRRIGATION AND DEBRIDEMENT ABSCESS Left 09/07/2016   Procedure:  IRRIGATION AND DEBRIDEMENT ABSCESS LEFT HEEL;  Surgeon: [name], DPM;  Location: [facility] ORS;  Service: Podiatry;  Laterality: Left;  . JOINT REPLACEMENT  2013   left knee replacement  . KIDNEY SURGERY Right    kidney stones  . LITHOTRIPSY    . NM [facility] HX)  February 2017   Likely breast attenuation. LOW RISK study. Normal EF 55-60%.  . OTHER SURGICAL HISTORY     08/2016 or 09/2016 surgery on achilles tenso h/o staph infection heal. Dr. [name]  . removal of left hematoma Left    leg  . REVERSE SHOULDER ARTHROPLASTY Right 10/08/2015   Procedure: REVERSE SHOULDER ARTHROPLASTY;  Surgeon: [name], MD;  Location: [facility] ORS;  Service: Orthopedics;  Laterality: Right;  . SLEEVE GASTROPLASTY    . TONSILLECTOMY    . TOTAL KNEE ARTHROPLASTY    . TOTAL SHOULDER REPLACEMENT Right   . TRANSESOPHAGEAL ECHOCARDIOGRAM  08/08/2013   Mild LVH, EF 60-65%. Moderate LA dilation and mild RA dilation. Mild MR with no evidence of stenosis and no evidence of endocarditis. A false chordae is noted.  . TRANSTHORACIC ECHOCARDIOGRAM  08/03/2013   Mild-Moderate concentric LVH, EF 60-65%. Normal diastolic function. Mild LA dilation. Mild MR with possible vegetation  - not confirmed on TEE   . ULNAR NERVE TRANSPOSITION Right 08/06/2016   Procedure: ULNAR NERVE DECOMPRESSION/TRANSPOSITION;  Surgeon: [name], MD;  Location: [facility] ORS;  Service: Orthopedics;  Laterality: Right;  . UPPER GI ENDOSCOPY    . VAGINAL HYSTERECTOMY      Vitals:   06/15/17 1354  BP: 135/68  Pulse: 60  SpO2: 100%    Subjective Assessment - 06/15/17 1422    Subjective  Pt reports that last Monday she had a fall and landed on her R hip when bending forward to put down a roll of paper towels. She states that she has been having persistent R hip pain since that time but it is slightly improving. She reports her R hip pain as a 4/10.     Patient is accompained by:  Family member    Pertinent History  Patient presents  with hx of DM, PVD, peripheral neuropathy. In June 2018 patient suffered staph infection and subsequent necrosis of L Achilles tendon requiring partial resection. Patient has suffered three falls in last six months, including one where she felt a "pop" in the hip while getting out of the bathtub. Went to [facility] two days later and received x-ray, negative; diagnosed with muscle strain and was recommended exercises. ER visit on 1/28 following third fall onto L hip, diagnosed with hematoma. Patient reports reduced sensation in feet L > R. Requires assistance to get into and out of bed since tendon surgery. Patient uses spc for ambulation and reports that small  steps do not cause pain.    Limitations  Walking;Sitting;Standing;Lifting;House hold activities    How long can you sit comfortably?  1 hr    How long can you stand comfortably?  n/a    How long can you walk comfortably?  40 min    Diagnostic tests  x-ray, negative, CT scan showed torn muscle    Patient Stated Goals  Improve balance, reduce fall risk, and can go somewhere without FOF    Currently in Pain?  Yes    Pain Score  4     Pain Location  Hip    Pain Orientation  Right;Posterior    Pain Descriptors / Indicators  Stabbing    Pain Type  Chronic pain    Pain Onset  1 to 4 weeks ago    Pain Frequency  Intermittent    Multiple Pain Sites  No                No data recorded   Treatment:  Ther-ex  NuStep L1 x 5 minutes for warm-up during history; Extensive conversation regarding her posterior R hip pain s/p fall; Standing mini squats 2 x 10; Standing high knee marches 2 x 10; Seated LAQ with manual resistance BLE 2 x 10; Standing hip abduction 2 x 10 bilateral;  Intermittent seated rest breaks throughout session secondary to fatigue. CGA and Min to mod verbal cues used throughout.             PT Education - 06/15/17 1424    Education provided  Yes    Education Details  exercise form/technique    Person(s)  Educated  Patient    Methods  Explanation    Comprehension  Verbalized understanding       PT Short Term Goals - 04/26/17 1502      PT SHORT TERM GOAL #1   Title  Patient will be independent in home exercise program to improve strength/mobility for better functional independence with bed mobility and ADLs.    Baseline  No HEP    Time  4    Period  Weeks    Status  New    Target Date  05/24/17      PT SHORT TERM GOAL #2   Title  Patient will report a worst pain score of 3/10 VAS in the L hip to improve tolerance with ADLs and reduce symptoms with bed mobility activities.      Baseline  6/10 with medication    Time  4    Period  Weeks    Status  New    Target Date  05/24/17      PT SHORT TERM GOAL #3   Title  Patient will demonstrate sit to supine bed mobility, supine to sit bed mobility, and rolling L and R with minimal assistance for improved functional mobility and independence    Baseline  Patient requires mod-max A for bed mobility    Time  4    Period  Weeks    Status  New    Target Date  05/24/17        PT Long Term Goals - 05/25/17 1317      PT LONG TERM GOAL #1   Title  Patient (>32 years old) will improve 10-meter walk test to 0.6 meters/sec with least restrictive AD to demonstrate reduced fall risk and capacity for limited community ambulation.    Baseline  0.21 meters/sec, 05/25/17 = .21 m/sec    Time  8  Period  Weeks    Status  On-going    Target Date  06/21/17      PT LONG TERM GOAL #2   Title  Patient will improve LEFS score to 40/80 to demonstrate improved functional mobility and independence with ADLs    Baseline  9/80; 05/25/17= 12/80    Time  8    Period  Weeks    Status  On-going    Target Date  06/21/17      PT LONG TERM GOAL #3   Title  Patient will demonstrate ability to perform bed mobility independently for improved functional mobility and independence.    Baseline  Patient requires mod-max A for bed mobility, supine to sit with min asssit,  and sit to supine with mod assist     Time  8    Period  Weeks    Status  On-going    Target Date  06/21/17            Plan - 06/15/17 1424    Clinical Impression Statement  Pt requires multiple seated rest breaks throughout session. She complains of persistent R hip pain but it improves slightly as session progresses. Pt encouraged to follow-up with PCP if her hip pain is concerning or debilitating. Pt limited in activities today due to fatigue and pain. Pt encouraged to follow-up as scheduled.     Rehab Potential  Fair    Clinical Impairments Affecting Rehab Potential  - comorbidities, age, severity / + family support, acute injury    PT Frequency  2x / week    PT Duration  8 weeks    PT Treatment/Interventions  Aquatic Therapy;ADLs/Self Care Home Management;Cryotherapy;Electrical Stimulation;Moist Heat;Ultrasound;DME Instruction;Gait training;Stair training;Functional mobility training;Therapeutic activities;Therapeutic exercise;Balance training;Neuromuscular re-education;Patient/family education;Manual techniques;Compression bandaging;Scar mobilization;Passive range of motion;Dry needling;Energy conservation;Taping    PT Next Visit Plan  LE strengthening    Consulted and Agree with Plan of Care  Patient       Patient will benefit from skilled therapeutic intervention in order to improve the following deficits and impairments:  Abnormal gait, Decreased activity tolerance, Decreased balance, Decreased endurance, Decreased coordination, Decreased mobility, Decreased range of motion, Decreased safety awareness, Difficulty walking, Decreased strength, Hypomobility, Impaired perceived functional ability, Impaired UE functional use, Obesity, Impaired flexibility, Pain  Visit Diagnosis: Difficulty in walking, not elsewhere classified  Muscle weakness (generalized)     Problem List Patient Active Problem List   Diagnosis Date Noted  . Mass of both adrenal glands ([place]) 05/16/2017  .  Fatty liver 05/13/2017  . Pain of left hip joint 04/22/2017  . Abnormality of gait and mobility 04/22/2017  . Fall with injury 02/02/2017  . Anemia 11/18/2016  . Asthma 11/18/2016  . Carpal tunnel syndrome 11/18/2016  . Pain in joint involving ankle and foot 11/18/2016  . Obstructive sleep apnea of adult 11/18/2016  . Spinal stenosis of thoracic region 11/18/2016  . Abscess of tendon of left foot 09/21/2016  . Cubital tunnel syndrome on right 08/07/2016  . Chronic pain syndrome 02/25/2016  . Long term current use of opiate analgesic 02/25/2016  . Chronic right shoulder pain 02/25/2016  . Status post reverse total shoulder replacement 10/08/2015  . 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 Morbid obesity with BMI of 40.0-44.9, adult ([place]) 09/23/2015  . Chronic prescription benzodiazepine use 09/23/2015  . Osteoarthritis, multiple sites 09/23/2015  . Other shoulder lesions (Right) 08/28/2015  . Cervical radiculitis 08/06/2015  . Opiate use (15 MME/Day) 01/16/2015  .  Impingement syndrome of shoulder region 01/07/2015  . Mixed incontinence 11/26/2014  . Atrophic vaginitis 11/26/2014  . Anxiety and depression 09/21/2014  . Bladder cystocele 09/21/2014  . Gastro-esophageal reflux disease without esophagitis 09/21/2014  . DM (diabetes mellitus) type II, controlled, with peripheral vascular disorder ([place]) 08/31/2014  . Diabetic peripheral neuropathy associated with type 2 diabetes mellitus ([place]) 08/31/2014  . Asthma, moderate persistent, well-controlled 08/31/2014  . Hypothyroidism, adult 08/31/2014  . Peripheral vascular disease due to secondary diabetes mellitus ([place]) 12/11/2013  . Apnea, sleep 10/19/2013  . Paroxysmal atrial fibrillation ([place]); CHA2DS2-Vasc Score 5. On Pradaxa 07/27/2013  . Essential hypertension 07/27/2013   Phillips Grout PT, DPT   Claudio Mondry 06/15/2017, 4:34 PM  [place] MAIN [facility] SERVICES [address] [place], [place], [address] Phone: [phone]   Fax:  [phone]  Name: Kellina Dreese MRN: [id number] Date of Birth: 1950/11/03

## 2017-06-16 ENCOUNTER — Other Ambulatory Visit: Payer: Self-pay | Admitting: Family Medicine

## 2017-06-16 DIAGNOSIS — F329 Major depressive disorder, single episode, unspecified: Secondary | ICD-10-CM

## 2017-06-16 DIAGNOSIS — F419 Anxiety disorder, unspecified: Secondary | ICD-10-CM

## 2017-06-17 ENCOUNTER — Ambulatory Visit: Payer: PPO | Admitting: Physical Therapy

## 2017-06-17 NOTE — Telephone Encounter (Signed)
Last OV 05/13/17 last filled by Dr.Tullo 02/02/17 60 0rf

## 2017-06-22 ENCOUNTER — Ambulatory Visit: Payer: PPO | Admitting: Physical Therapy

## 2017-06-23 DIAGNOSIS — S76011D Strain of muscle, fascia and tendon of right hip, subsequent encounter: Secondary | ICD-10-CM | POA: Diagnosis not present

## 2017-06-23 DIAGNOSIS — E1143 Type 2 diabetes mellitus with diabetic autonomic (poly)neuropathy: Secondary | ICD-10-CM | POA: Diagnosis not present

## 2017-06-23 DIAGNOSIS — S76311D Strain of muscle, fascia and tendon of the posterior muscle group at thigh level, right thigh, subsequent encounter: Secondary | ICD-10-CM | POA: Diagnosis not present

## 2017-06-23 DIAGNOSIS — L97511 Non-pressure chronic ulcer of other part of right foot limited to breakdown of skin: Secondary | ICD-10-CM | POA: Diagnosis not present

## 2017-06-24 ENCOUNTER — Ambulatory Visit (INDEPENDENT_AMBULATORY_CARE_PROVIDER_SITE_OTHER): Payer: PPO | Admitting: Internal Medicine

## 2017-06-24 ENCOUNTER — Encounter: Payer: Self-pay | Admitting: Internal Medicine

## 2017-06-24 VITALS — BP 162/68 | HR 69 | Temp 98.2°F | Ht 65.0 in | Wt 254.4 lb

## 2017-06-24 DIAGNOSIS — E11621 Type 2 diabetes mellitus with foot ulcer: Secondary | ICD-10-CM | POA: Diagnosis not present

## 2017-06-24 DIAGNOSIS — L309 Dermatitis, unspecified: Secondary | ICD-10-CM

## 2017-06-24 DIAGNOSIS — L97422 Non-pressure chronic ulcer of left heel and midfoot with fat layer exposed: Secondary | ICD-10-CM | POA: Diagnosis not present

## 2017-06-24 DIAGNOSIS — E039 Hypothyroidism, unspecified: Secondary | ICD-10-CM | POA: Diagnosis not present

## 2017-06-24 DIAGNOSIS — E279 Disorder of adrenal gland, unspecified: Secondary | ICD-10-CM | POA: Diagnosis not present

## 2017-06-24 DIAGNOSIS — I10 Essential (primary) hypertension: Secondary | ICD-10-CM | POA: Diagnosis not present

## 2017-06-24 DIAGNOSIS — R799 Abnormal finding of blood chemistry, unspecified: Secondary | ICD-10-CM | POA: Diagnosis not present

## 2017-06-24 DIAGNOSIS — M7918 Myalgia, other site: Secondary | ICD-10-CM

## 2017-06-24 DIAGNOSIS — L97529 Non-pressure chronic ulcer of other part of left foot with unspecified severity: Secondary | ICD-10-CM

## 2017-06-24 DIAGNOSIS — E278 Other specified disorders of adrenal gland: Secondary | ICD-10-CM

## 2017-06-24 LAB — BASIC METABOLIC PANEL
BUN: 20 mg/dL (ref 6–23)
CHLORIDE: 104 meq/L (ref 96–112)
CO2: 30 mEq/L (ref 19–32)
Calcium: 9.1 mg/dL (ref 8.4–10.5)
Creatinine, Ser: 0.82 mg/dL (ref 0.40–1.20)
GFR: 73.73 mL/min (ref >60.00)
GLUCOSE: 114 mg/dL — AB (ref 70–99)
POTASSIUM: 4.5 meq/L (ref 3.5–5.1)
SODIUM: 141 meq/L (ref 135–145)

## 2017-06-24 LAB — TSH: TSH: 3.7 u[IU]/mL (ref 0.35–4.50)

## 2017-06-24 MED ORDER — CLOBETASOL PROPIONATE 0.05 % EX CREA: 1.0000 "application " | TOPICAL_CREAM | Freq: Two times a day (BID) | CUTANEOUS | 0 refills | Status: DC

## 2017-06-24 NOTE — Progress Notes (Signed)
Pre visit review using our clinic review tool, if applicable. No additional management support is needed unless otherwise documented below in the visit note. 

## 2017-06-24 NOTE — Patient Instructions (Signed)
Bland diet  Try Align probiotics or other options Try Clobetasol for your arms   Diarrhea, Adult Diarrhea is frequent loose and watery bowel movements. Diarrhea can make you feel weak and cause you to become dehydrated. Dehydration can make you tired and thirsty, cause you to have a dry mouth, and decrease how often you urinate. Diarrhea typically lasts 2-3 days. However, it can last longer if it is a sign of something more serious. It is important to treat your diarrhea as told by your health care provider. Follow these instructions at home: Eating and drinking  Follow these recommendations as told by your health care provider:  Take an oral rehydration solution (ORS). This is a drink that is sold at pharmacies and retail stores.  Drink clear fluids, such as water, ice chips, diluted fruit juice, and low-calorie sports drinks.  Eat bland, easy-to-digest foods in small amounts as you are able. These foods include bananas, applesauce, rice, lean meats, toast, and crackers.  Avoid drinking fluids that contain a lot of sugar or caffeine, such as energy drinks, sports drinks, and soda.  Avoid alcohol.  Avoid spicy or fatty foods.  General instructions  Drink enough fluid to keep your urine clear or pale yellow.  Wash your hands often. If soap and water are not available, use hand sanitizer.  Make sure that all people in your household wash their hands well and often.  Take over-the-counter and prescription medicines only as told by your health care provider.  Rest at home while you recover.  Watch your condition for any changes.  Take a warm bath to relieve any burning or pain from frequent diarrhea episodes.  Keep all follow-up visits as told by your health care provider. This is important. Contact a health care provider if:  You have a fever.  Your diarrhea gets worse.  You have new symptoms.  You cannot keep fluids down.  You feel light-headed or dizzy.  You have a  headache  You have muscle cramps. Get help right away if:  You have chest pain.  You feel extremely weak or you faint.  You have bloody or black stools or stools that look like tar.  You have severe pain, cramping, or bloating in your abdomen.  You have trouble breathing or you are breathing very quickly.  Your heart is beating very quickly.  Your skin feels cold and clammy.  You feel confused.  You have signs of dehydration, such as: ? Dark urine, very little urine, or no urine. ? Cracked lips. ? Dry mouth. ? Sunken eyes. ? Sleepiness. ? Weakness. This information is not intended to replace advice given to you by your health care provider. Make sure you discuss any questions you have with your health care provider. Document Released: 02/27/2002 Document Revised: 07/18/2015 Document Reviewed: 11/13/2014 Elsevier Interactive Patient Education  [employer].

## 2017-06-27 ENCOUNTER — Encounter: Payer: Self-pay | Admitting: Internal Medicine

## 2017-06-27 DIAGNOSIS — L309 Dermatitis, unspecified: Secondary | ICD-10-CM | POA: Insufficient documentation

## 2017-06-27 NOTE — Progress Notes (Addendum)
Chief Complaint  Patient presents with  . Follow-up   F/u  1. BP elevated today 170/82 repeat 162/68 pre pt she has had her medications today Quinapril 40 mg, Norvasc 5 mg qd, Metoprolol 25 mg bid. She reports she is under a lot of stress her 68 y.o daughter wants to sell  Her house and move in with her and her daughter has a daughter as well and her son is an alcoholic who may have just been in jail for DV but she states she did not rear her children that way  She denies increased salt intake.   2. C/o rash to arms L>R that itches and triamcinolone 0.1 % is not helping  3. She is going to Dr. Candelaria Stagers 06/30/17 for steroid injection right hamstring/posterior thigh/buttock region still having pain x months and will consider MRI with ortho if this does not help 4. Chronic would to left heel abscess + MSSA and left achilles ulceration since 08/2016 with ulcer to left heel slow to heel and left ankle s/p surgery slow to heel.Dr. [name] disc with pt about graft 5. She reports recent episode of n/v/diarrhea 2-3 x this week she tried zofran which helped. She is not sure if it is her nerves. She also was just on doxy for #4 per Dr. [name] x 4 weeks but was only to tolerate 3 weeks out of 4 weeks.   Review of Systems  Constitutional: Negative for weight loss.  HENT: Negative for hearing loss.   Eyes: Negative for blurred vision.  Respiratory: Negative for shortness of breath.   Cardiovascular: Negative for chest pain.  Gastrointestinal: Positive for diarrhea, nausea and vomiting. Negative for abdominal pain.  Musculoskeletal: Positive for joint pain.  Skin: Positive for rash.       +skin ulceration left achilles and left heel wound   Neurological: Negative for headaches.  Psychiatric/Behavioral: The patient is nervous/anxious.    Past Medical History:  Diagnosis Date  . Abnormal antibody titer   . Anxiety and depression   . Arthritis   . Asthma   . Cystocele   . Depression   . Diabetes ([place])    . DVT (deep venous thrombosis) (H[facility])    Righ calf  . Dysrhythmia   . Edema   . GERD (gastroesophageal reflux disease)   . Heart murmur    Echocardiogram June 2015: Mild MR (possible vegetation seen on TTE, not seen on TEE), normal LV size with moderate concentric LVH. Normal function EF 60-65%. Normal diastolic function. Mild LA dilation.  Marland Kitchen History of IBS   . History of methicillin resistant staphylococcus aureus (MRSA) 2008  . Hyperlipidemia   . Hypertension   . Hypothyroidism   . Insomnia   . Kidney stone    kidney stones with lithotripsy  . Neuropathy involving both lower extremities   . Obesity, Class II, BMI 35-39.9, with comorbidity   . Paroxysmal atrial fibrillation ([place]) 2007   In June 2015, Cardiac Event Monitor: Mostly SR/sinus arrhythmia with PVCs that are frequent. Short bursts of A. fib lasting several minutes;; CHA2DS2-VASc Score = 5 (age, Female, PVD, DM, HTN)  . Peripheral vascular disease ([place])   . Sleep apnea    use C-PAP  . Urinary incontinence   . Venous stasis dermatitis of both lower extremities    Past Surgical History:  Procedure Laterality Date  . ANKLE SURGERY Right   . APPLICATION OF WOUND VAC Left 06/27/2015   Procedure: APPLICATION OF WOUND VAC ( POSSIBLE ) ;  Surgeon: [name], MD;  Location: [facility] ORS;  Service: Vascular;  Laterality: Left;  . APPLICATION OF WOUND VAC Left 09/25/2016   Procedure: APPLICATION OF WOUND VAC;  Surgeon: [name], DPM;  Location: [facility] ORS;  Service: Podiatry;  Laterality: Left;  . arm surgery     right    . CHOLECYSTECTOMY    . COLONOSCOPY    . COLONOSCOPY WITH PROPOFOL N/A 01/11/2017   Procedure: COLONOSCOPY WITH PROPOFOL;  Surgeon: [name], MD;  Location: [facility] ENDOSCOPY;  Service: Endoscopy;  Laterality: N/A;  . COLONOSCOPY WITH PROPOFOL N/A 02/22/2017   Procedure: COLONOSCOPY WITH PROPOFOL;  Surgeon: [name], MD;  Location: [facility] ENDOSCOPY;  Service: Endoscopy;  Laterality: N/A;  .  COLONOSCOPY WITH PROPOFOL N/A 05/31/2017   Procedure: COLONOSCOPY WITH PROPOFOL;  Surgeon: [name], MD;  Location: [facility] ENDOSCOPY;  Service: Endoscopy;  Laterality: N/A;  . ESOPHAGOGASTRODUODENOSCOPY (EGD) WITH PROPOFOL N/A 01/11/2017   Procedure: ESOPHAGOGASTRODUODENOSCOPY (EGD) WITH PROPOFOL;  Surgeon: [name], MD;  Location: [facility] ENDOSCOPY;  Service: Endoscopy;  Laterality: N/A;  . HERNIA REPAIR     umbilical  . HIATAL HERNIA REPAIR    . I&D EXTREMITY Left 06/27/2015   Procedure: IRRIGATION AND DEBRIDEMENT EXTREMITY            ( CALF HEMATOMA ) POSSIBLE WOUND VAC;  Surgeon: [name], MD;  Location: [facility] ORS;  Service: Vascular;  Laterality: Left;  . INCISION AND DRAINAGE OF WOUND Left 09/25/2016   Procedure: IRRIGATION AND DEBRIDEMENT - PARTIAL RESECTION OF ACHILLES TENDON WITH WOUND VAC APPLICATION;  Surgeon: [name], DPM;  Location: [facility] ORS;  Service: Podiatry;  Laterality: Left;  . IRRIGATION AND DEBRIDEMENT ABSCESS Left 09/07/2016   Procedure: IRRIGATION AND DEBRIDEMENT ABSCESS LEFT HEEL;  Surgeon: [name], DPM;  Location: [facility] ORS;  Service: Podiatry;  Laterality: Left;  . JOINT REPLACEMENT  2013   left knee replacement  . KIDNEY SURGERY Right    kidney stones  . LITHOTRIPSY    . [facility] HX)  February 2017   Likely breast attenuation. LOW RISK study. Normal EF 55-60%.  . OTHER SURGICAL HISTORY     08/2016 or 09/2016 surgery on achilles tenso h/o staph infection heal. Dr. [name]  . removal of left hematoma Left    leg  . REVERSE SHOULDER ARTHROPLASTY Right 10/08/2015   Procedure: REVERSE SHOULDER ARTHROPLASTY;  Surgeon: [name], MD;  Location: [facility] ORS;  Service: Orthopedics;  Laterality: Right;  . SLEEVE GASTROPLASTY    . TONSILLECTOMY    . TOTAL KNEE ARTHROPLASTY    . TOTAL SHOULDER REPLACEMENT Right   . TRANSESOPHAGEAL ECHOCARDIOGRAM  08/08/2013   Mild LVH, EF 60-65%. Moderate LA dilation and mild RA dilation. Mild MR  with no evidence of stenosis and no evidence of endocarditis. A false chordae is noted.  . TRANSTHORACIC ECHOCARDIOGRAM  08/03/2013   Mild-Moderate concentric LVH, EF 60-65%. Normal diastolic function. Mild LA dilation. Mild MR with possible vegetation  - not confirmed on TEE   . ULNAR NERVE TRANSPOSITION Right 08/06/2016   Procedure: ULNAR NERVE DECOMPRESSION/TRANSPOSITION;  Surgeon: [name], MD;  Location: [facility] ORS;  Service: Orthopedics;  Laterality: Right;  . UPPER GI ENDOSCOPY    . VAGINAL HYSTERECTOMY     Family History  Problem Relation Age of Onset  . Skin cancer Father   . Diabetes Father   . Hypertension Father   . Peripheral vascular disease Father   . Cancer  Father   . Cerebral aneurysm Father   . Alcohol abuse Father   . Varicose Veins Mother   . Kidney disease Mother   . Arthritis Mother   . Mental illness Sister   . Cancer Maternal Aunt        breast  . Breast cancer Maternal Aunt   . Arthritis Maternal Grandmother   . Hypertension Maternal Grandmother   . Diabetes Maternal Grandmother   . Arthritis Maternal Grandfather   . Heart disease Maternal Grandfather   . Hypertension Maternal Grandfather   . Arthritis Paternal Grandmother   . Hypertension Paternal Grandmother   . Diabetes Paternal Grandmother   . Arthritis Paternal Grandfather   . Hypertension Paternal Grandfather   . Bladder Cancer Neg Hx   . Kidney cancer Neg Hx    Social History   Socioeconomic History  . Marital status: Married    Spouse name: Not on file  . Number of children: Not on file  . Years of education: Not on file  . Highest education level: Not on file  Occupational History  . Not on file  Social Needs  . Financial resource strain: Not on file  . Food insecurity:    Worry: Not on file    Inability: Not on file  . Transportation needs:    Medical: Not on file    Non-medical: Not on file  Tobacco Use  . Smoking status: Never Smoker  . Smokeless tobacco: Never Used    Substance and Sexual Activity  . Alcohol use: No    Alcohol/week: 0.0 oz  . Drug use: No  . Sexual activity: Never  Lifestyle  . Physical activity:    Days per week: Not on file    Minutes per session: Not on file  . Stress: Not on file  Relationships  . Social connections:    Talks on phone: Not on file    Gets together: Not on file    Attends religious service: Not on file    Active member of club or organization: Not on file    Attends meetings of clubs or organizations: Not on file    Relationship status: Not on file  . Intimate partner violence:    Fear of current or ex partner: Not on file    Emotionally abused: Not on file    Physically abused: Not on file    Forced sexual activity: Not on file  Other Topics Concern  . Not on file  Social History Narrative   She is currently married -- for 28 years. Does not work. Does not smoke or take alcohol. She never smoked. She exercises at least 3 days a week since before her gastric surgery.   Marital status reviewed in history of present illness.   She has children and grandchildren    Current Meds  Medication Sig  . albuterol (PROVENTIL HFA;VENTOLIN HFA) 108 (90 Base) MCG/ACT inhaler Inhale 1-2 puffs into the lungs every 4 (four) hours as needed for wheezing or shortness of breath. (Patient taking differently: Inhale 2 puffs into the lungs every 4 (four) hours as needed for wheezing or shortness of breath. )  . albuterol (PROVENTIL) (2.5 MG/3ML) 0.083% nebulizer solution Take 3 mLs (2.5 mg total) by nebulization every 6 (six) hours as needed for wheezing or shortness of breath.  . ALPRAZolam (XANAX) 0.5 MG tablet Take 1 tablet (0.5 mg total) by mouth 2 (two) times daily as needed for anxiety.  Marland Kitchen amLODipine (NORVASC) 5 MG tablet  Take 1 tablet (5 mg total) by mouth daily.  Marland Kitchen buPROPion (WELLBUTRIN XL) 300 MG 24 hr tablet TAKE ONE TABLET BY MOUTH DAILY  . CALCIUM ALGINATE EX Apply topically.  . cyanocobalamin 500 MCG tablet Take  500 mcg by mouth daily.  . dabigatran (PRADAXA) 150 MG CAPS capsule Take 1 capsule (150 mg total) by mouth 2 (two) times daily.  Marland Kitchen dexlansoprazole (DEXILANT) 60 MG capsule Take 1 capsule (60 mg total) by mouth daily. Minutes before meal (lunch)  . doxycycline (VIBRA-TABS) 100 MG tablet   . DULoxetine (CYMBALTA) 60 MG capsule Take 1 capsule (60 mg total) by mouth daily.  Marland Kitchen estradiol (ESTRACE) 0.1 MG/GM vaginal cream Place 1 Applicatorful vaginally 3 (three) times a week.  . Ferrous Gluconate 324 (37.5 Fe) MG TABS Take 1 tablet (324 mg total) by mouth daily with lunch. With food  . ferrous sulfate 325 (65 FE) MG tablet Take 325 mg by mouth daily with breakfast.  . fluconazole (DIFLUCAN) 150 MG tablet   . Fluticasone-Salmeterol (ADVAIR) 250-50 MCG/DOSE AEPB Inhale 1 puff into the lungs 2 (two) times daily.  Marland Kitchen gabapentin (NEURONTIN) 800 MG tablet Take 1 tablet (800 mg total) by mouth 4 (four) times daily.  Derrill Memo ON 07/12/2017] HYDROcodone-acetaminophen (NORCO/VICODIN) 5-325 MG tablet Take 1 tablet by mouth every 8 (eight) hours as needed for severe pain.  Marland Kitchen levocetirizine (XYZAL) 5 MG tablet Take 1 tablet (5 mg total) by mouth every evening.  Marland Kitchen levofloxacin (LEVAQUIN) 250 MG tablet Take 1 tablet (250 mg total) by mouth daily.  Marland Kitchen levothyroxine (SYNTHROID, LEVOTHROID) 175 MCG tablet Take 1 tablet (175 mcg total) by mouth daily before breakfast.  . meloxicam (MOBIC) 15 MG tablet Take 15 mg by mouth daily.   . metFORMIN (GLUCOPHAGE) 1000 MG tablet TAKE ONE TABLET BY MOUTH TWICE DAILY (Patient taking differently: TAKE ONE TABLET IN THE MORNING AND HALF TABLET AT NIGHT)  . metoprolol tartrate (LOPRESSOR) 25 MG tablet TAKE 1 TABLET BY MOUTH TWICE DAILY  . mirabegron ER (MYRBETRIQ) 25 MG TB24 tablet Take 1 tablet (25 mg total) by mouth daily.  . montelukast (SINGULAIR) 10 MG tablet TAKE ONE TABLET BY MOUTH EVERY DAY  . Multiple Vitamin (MULTIVITAMIN WITH MINERALS) TABS tablet Take 1 tablet by mouth daily.    . nitrofurantoin, macrocrystal-monohydrate, (MACROBID) 100 MG capsule Take 1 capsule (100 mg total) by mouth 2 (two) times daily.  . ondansetron (ZOFRAN) 4 MG tablet Take 1 tablet (4 mg total) every 8 (eight) hours as needed by mouth for nausea or vomiting.  Marland Kitchen oxybutynin (DITROPAN XL) 15 MG 24 hr tablet Take 1 tablet (15 mg total) by mouth at bedtime.  . potassium chloride (K-DUR,KLOR-CON) 10 MEQ tablet Take 10 mEq by mouth 2 (two) times daily.  . quinapril (ACCUPRIL) 40 MG tablet Take 1 tablet (40 mg total) by mouth daily. MUST KEEP APPT THIS TIME FOR ADDITIONAL REFILLS  . triamcinolone cream (KENALOG) 0.1 % Apply 1 application 2 (two) times daily as needed topically.   Allergies  Allergen Reactions  . Penicillins Hives, Shortness Of Breath and Swelling    Facial swelling Has patient had a PCN reaction causing immediate rash, facial/tongue/throat swelling, SOB or lightheadedness with hypotension: Yes Has patient had a PCN reaction causing severe rash involving mucus membranes or skin necrosis: Yes Has patient had a PCN reaction that required hospitalization No Has patient had a PCN reaction occurring within the last 10 years: No If all of the above answers are "NO", then may  proceed with Cephalosporin use.   . Aspirin Hives  . Morphine And Related Other (See Comments)    Patient becomes very confused  . Terramycin [Oxytetracycline] Hives   Recent Results (from the past 2160 hour(s))  Hepatitis C antibody     Status: None   Collection Time: 05/10/17  9:25 AM  Result Value Ref Range   Hepatitis C Ab NON-REACTIVE NON-REACTI   SIGNAL TO CUT-OFF 0.04 <1.00    Comment: . HCV antibody was non-reactive. There is no laboratory  evidence of HCV infection. . In most cases, no further action is required. However, if recent HCV exposure is suspected, a test for HCV RNA (test code [phone]) is suggested. . For additional information please refer  to http://education.[facility].com/faq/FAQ22v1 (This link is being provided for informational/ educational purposes only.) .   Hepatitis B surface antigen     Status: None   Collection Time: 05/10/17  9:25 AM  Result Value Ref Range   Hepatitis B Surface Ag NON-REACTIVE NON-REACTI  Hepatitis B surface antibody     Status: Abnormal   Collection Time: 05/10/17  9:25 AM  Result Value Ref Range   Hepatitis B-Post <5 (L) > OR = 10 mIU/mL    Comment: . Patient does not have immunity to hepatitis B virus. . For additional information, please refer to http://education.[facility].com/faq/FAQ105 (This link is being provided for informational/ educational purposes only).   B12     Status: None   Collection Time: 05/10/17  9:25 AM  Result Value Ref Range   Vitamin B-12 502 211 - 911 pg/mL  Vitamin D (25 hydroxy)     Status: None   Collection Time: 05/10/17  9:25 AM  Result Value Ref Range   VITD 32.15 30.00 - 100.00 ng/mL  Urine Microalbumin w/creat. ratio     Status: Abnormal   Collection Time: 05/10/17  9:25 AM  Result Value Ref Range   Microalb, Ur 2.7 (H) 0.0 - 1.9 mg/dL   Creatinine,U 158.8 mg/dL   Microalb Creat Ratio 1.7 0.0 - 30.0 mg/g  TSH     Status: Abnormal   Collection Time: 05/10/17  9:25 AM  Result Value Ref Range   TSH 6.17 (H) 0.35 - 4.50 uIU/mL  Lipid panel     Status: None   Collection Time: 05/10/17  9:25 AM  Result Value Ref Range   Cholesterol 149 0 - 200 mg/dL    Comment: ATP III Classification       Desirable:  < 200 mg/dL               Borderline High:  200 - 239 mg/dL          High:  > = 240 mg/dL   Triglycerides 81.0 0.0 - 149.0 mg/dL    Comment: Normal:  <150 mg/dLBorderline High:  150 - 199 mg/dL   HDL 65.50 >39.00 mg/dL   VLDL 16.2 0.0 - 40.0 mg/dL   LDL Cholesterol 67 0 - 99 mg/dL   Total CHOL/HDL Ratio 2     Comment:                Men          Women1/2 Average Risk     3.4          3.3Average Risk          5.0          4.42X Average  Risk          9.6  7.13X Average Risk          15.0          11.0                       NonHDL 83.24     Comment: NOTE:  Non-HDL goal should be 30 mg/dL higher than patient's LDL goal (i.e. LDL goal of < 70 mg/dL, would have non-HDL goal of < 100 mg/dL)  Hemoglobin A1c     Status: None   Collection Time: 05/10/17  9:25 AM  Result Value Ref Range   Hgb A1c MFr Bld 6.4 4.6 - 6.5 %    Comment: Glycemic Control Guidelines for People with Diabetes:Non Diabetic:  <6%Goal of Therapy: <7%Additional Action Suggested:  >8%   Urinalysis, Routine w reflex microscopic     Status: Abnormal   Collection Time: 05/10/17  9:25 AM  Result Value Ref Range   Color, Urine YELLOW Yellow;Lt. Yellow   APPearance Sl Cloudy (A) Clear   Specific Gravity, Urine >=1.030 (A) 1.000 - 1.030   pH 5.5 5.0 - 8.0   Total Protein, Urine NEGATIVE Negative   Urine Glucose NEGATIVE Negative   Ketones, ur TRACE (A) Negative   Bilirubin Urine NEGATIVE Negative   Hgb urine dipstick NEGATIVE Negative   Urobilinogen, UA 0.2 0.0 - 1.0   Leukocytes, UA SMALL (A) Negative   Nitrite NEGATIVE Negative   WBC, UA 11-20/hpf (A) 0-2/hpf   RBC / HPF none seen 0-2/hpf   Mucus, UA Presence of (A) None   Squamous Epithelial / LPF Few(5-10/hpf) (A) Rare(0-4/hpf)  CBC with Differential/Platelet     Status: Abnormal   Collection Time: 05/10/17  9:25 AM  Result Value Ref Range   WBC 5.6 4.0 - 10.5 K/uL   RBC 3.98 3.87 - 5.11 Mil/uL   Hemoglobin 10.9 (L) 12.0 - 15.0 g/dL   HCT 33.9 (L) 36.0 - 46.0 %   MCV 85.0 78.0 - 100.0 fl   MCHC 32.3 30.0 - 36.0 g/dL   RDW 15.8 (H) 11.5 - 15.5 %   Platelets 271.0 150.0 - 400.0 K/uL   Neutrophils Relative % 62.6 43.0 - 77.0 %   Lymphocytes Relative 19.4 12.0 - 46.0 %   Monocytes Relative 9.2 3.0 - 12.0 %   Eosinophils Relative 7.3 (H) 0.0 - 5.0 %   Basophils Relative 1.5 0.0 - 3.0 %   Neutro Abs 3.5 1.4 - 7.7 K/uL   Lymphs Abs 1.1 0.7 - 4.0 K/uL   Monocytes Absolute 0.5 0.1 - 1.0 K/uL    Eosinophils Absolute 0.4 0.0 - 0.7 K/uL   Basophils Absolute 0.1 0.0 - 0.1 K/uL  Comprehensive metabolic panel     Status: Abnormal   Collection Time: 05/10/17  9:25 AM  Result Value Ref Range   Sodium 141 135 - 145 mEq/L   Potassium 4.7 3.5 - 5.1 mEq/L   Chloride 107 96 - 112 mEq/L   CO2 27 19 - 32 mEq/L   Glucose, Bld 108 (H) 70 - 99 mg/dL   BUN 29 (H) 6 - 23 mg/dL   Creatinine, Ser 0.89 0.40 - 1.20 mg/dL   Total Bilirubin 0.4 0.2 - 1.2 mg/dL   Alkaline Phosphatase 64 39 - 117 U/L   AST 12 0 - 37 U/L   ALT 9 0 - 35 U/L   Total Protein 6.6 6.0 - 8.3 g/dL   Albumin 3.9 3.5 - 5.2 g/dL   Calcium 9.0 8.4 - 10.5  mg/dL   GFR 67.10 >60.00 mL/min  Glucose, capillary     Status: Abnormal   Collection Time: 05/31/17  8:13 AM  Result Value Ref Range   Glucose-Capillary 151 (H) 65 - 99 mg/dL  Surgical pathology     Status: None   Collection Time: 05/31/17  8:49 AM  Result Value Ref Range   SURGICAL PATHOLOGY      Surgical Pathology THIS IS AN ADDENDUM REPORT  CASE: [id number] PATIENT: [place] [facility]ology Report Addendum   Reason for Addendum #1:  Additional special stains  SPECIMEN SUBMITTED: A. Colon polyps (multiple), rectosigmoid; cbx B. Colon polyp, descending; cbx C. Colon, random right; cbxs D. Colon, random left; cbxs E. Colon polyp, proximal sigmoid; cbx  CLINICAL HISTORY: None provided  PRE-OPERATIVE DIAGNOSIS: IDA, diarrhea  POST-OPERATIVE DIAGNOSIS: Colon polyps, diverticulosis     DIAGNOSIS: A. COLON POLYPS, RECTOSIGMOID; COLD BIOPSY: - HYPERPLASTIC POLYPS (3). - CONGO RED STAIN TO BE REPORTED IN AN ADDENDUM. - NEGATIVE FOR DYSPLASIA AND MALIGNANCY.  B. COLON POLYP, DESCENDING; COLD BIOPSY: - COLONIC MUCOSA WITH FOCAL HYPERPLASTIC EPITHELIAL CHANGE. - MULTIPLE DEEPER SECTIONS EXAMINED. - NEGATIVE FOR DYSPLASIA AND MALIGNANCY.  C. COLON, RANDOM RIGHT; COLD BIOPSY: - COLONIC MUCOSA WITH FOCAL PROMINEN T LYMPHOID AGGREGATE. - NEGATIVE  FOR MICROSCOPIC COLITIS, DYSPLASIA, AND MALIGNANCY.  D. COLON, RANDOM LEFT; COLD BIOPSY: - UNREMARKABLE COLONIC MUCOSA. - NEGATIVE FOR MICROSCOPIC COLITIS, DYSPLASIA, AND MALIGNANCY.  E. COLON POLYP, PROXIMAL SIGMOID; COLD BIOPSY: - TUBULAR ADENOMA. - NEGATIVE FOR HIGH GRADE DYSPLASIA AND MALIGNANCY.   GROSS DESCRIPTION:  A. Labeled: rectosigmoid colon polyp multiple  Tissue fragment(s): multiple  Size: aggregate, 0.8 x 0.3 x 0.1 cm  Description: in formalin, pink fragments  Entirely submitted in 1 cassette(s).   B. Labeled: descending colon polyp C BX  Tissue fragment(s): 1  Size: 0.5 cm  Description: in formalin, tan fragment  Entirely submitted in 1 cassette(s).  C. Labeled: random right colon C BX  Tissue fragment(s): 3  Size: 0.2-0.3 cm  Description: in formalin, tan fragment  Entirely submitted in 1 cassette(s).  D. Labeled: random left colon C BX  Tissue fragment(s): 3  Size: 0.3-0.4 cm  Descript ion:  in formalin, Tan fragments  Entirely submitted in 1 cassette(s).  E. Labeled: proximal sigmoid colon polyp C BX  Tissue fragment(s): 2  Size: 0.3 and 0.4 cm  Description:  in formalin, tan fragment  Entirely submitted in 1 cassette(s).     Final Diagnosis performed by Quay Burow, MD.  Electronically signed 06/02/2017 3:53:06PM   The electronic signature indicates that the named Attending Pathologist has evaluated the specimen  Technical component performed at [facility], [address], [place], [place] [address] Lab: [phone] Dir: Rush Farmer, MD, MMM  Professional component performed at [facility], [facility], [place], [place], [place] [address] Lab: [phone] Dir: Dellia Nims. Rubinas, MD   ADDENDUM: A [place] Red stain is negative for amyloid. Stain control worked appropriately.    Addendum #1 performed by Quay Burow, MD.  Electronically signed 06/04/2017 4:11:19PM    Technical component  performed at [facility] , [address], [place], [place] [address] Lab: [phone] Dir: Rush Farmer, MD, MMM  Professional component performed at [facility], [facility], [place], [place], [place] [address] Lab: [phone] Dir: Dellia Nims. Rubinas, MD    POCT Urinalysis Dipstick (Automated)     Status: Abnormal   Collection Time: 06/08/17 12:49 PM  Result Value Ref Range   Color, UA amber  Clarity, UA cloudy    Glucose, UA neg    Bilirubin, UA +    Ketones, UA +    Spec Grav, UA >=1.030 (A) 1.010 - 1.025   Blood, UA large    pH, UA 6.0 5.0 - 8.0   Protein, UA ++    Urobilinogen, UA 0.2 0.2 or 1.0 E.U./dL   Nitrite, UA neg    Leukocytes, UA Large (3+) (A) Negative  Urine Culture     Status: Abnormal   Collection Time: 06/08/17 12:55 PM  Result Value Ref Range   MICRO NUMBER: [id number]    SPECIMEN QUALITY: ADEQUATE    Sample Source URINE    STATUS: FINAL    ISOLATE 1: ESBL Klebsiella pneumoniae (A)     Comment: Greater than 100,000 CFU/mL of Klebsiella pneumoniae (ESBL) ESBL RESULT:        The organism has been confirmed as an ESBL producer.      Susceptibility   Esbl klebsiella pneumoniae - URINE CULTURE, REFLEX    AMOX/CLAVULANIC >=32 Resistant     AMPICILLIN >=32 Resistant      Extended spectrum beta-lactamase (ESBL) producingorganisms demonstrate decreased activity withpenicillins, cephalosporins and aztreonam.    AMPICILLIN/SULBACTAM >=32 Resistant     CEFAZOLIN >=64 Resistant      Extended spectrum beta-lactamase (ESBL) producingorganisms demonstrate decreased activity withpenicillins, cephalosporins and aztreonam.For uncomplicated UTI caused by E. coli,K. pneumoniae or P. mirabilis: Cefazolin issusceptible if MIC <32 mcg/mL and predictssusceptible to the oral agents cefaclor, cefdinir,cefpodoxime, cefprozil, cefuroxime, cephalexinand loracarbef.    CEFEPIME  Resistant     CEFTRIAXONE >=64 Resistant     CIPROFLOXACIN 2 Intermediate      LEVOFLOXACIN 1 Sensitive     ERTAPENEM <=0.5 Sensitive     GENTAMICIN >=16 Resistant     IMIPENEM <=0.25 Sensitive     NITROFURANTOIN 128 Resistant     PIP/TAZO 16 Sensitive     TOBRAMYCIN >=16 Resistant     TRIMETH/SULFA* >=320 Resistant      Extended spectrum beta-lactamase (ESBL) producingorganisms demonstrate decreased activity withpenicillins, cephalosporins and aztreonam.For uncomplicated UTI caused by E. coli,K. pneumoniae or P. mirabilis: Cefazolin issusceptible if MIC <32 mcg/mL and predictssusceptible to the oral agents cefaclor, cefdinir,cefpodoxime, cefprozil, cefuroxime, cephalexinand loracarbef.Legend:S = Susceptible  I = IntermediateR = Resistant  NS = Not susceptible* = Not tested  NR = Not reportedNN = See antimicrobic comments  TSH     Status: None   Collection Time: 06/24/17  2:30 PM  Result Value Ref Range   TSH 3.70 0.35 - 4.50 uIU/mL  Basic Metabolic Panel (BMET)     Status: Abnormal   Collection Time: 06/24/17  2:30 PM  Result Value Ref Range   Sodium 141 135 - 145 mEq/L   Potassium 4.5 3.5 - 5.1 mEq/L   Chloride 104 96 - 112 mEq/L   CO2 30 19 - 32 mEq/L   Glucose, Bld 114 (H) 70 - 99 mg/dL   BUN 20 6 - 23 mg/dL   Creatinine, Ser 0.82 0.40 - 1.20 mg/dL   Calcium 9.1 8.4 - 10.5 mg/dL   GFR 73.73 >60.00 mL/min   Objective  Body mass index is 42.33 kg/m. Wt Readings from Last 3 Encounters:  06/24/17 254 lb 6.4 oz (115.4 kg)  06/08/17 255 lb (115.7 kg)  05/31/17 251 lb (113.9 kg)   Temp Readings from Last 3 Encounters:  06/24/17 98.2 F (36.8 C) (Oral)  06/08/17 97.8 F (36.6 C) (Oral)  05/31/17 (!) 97.2 F (36.2 C) (Tympanic)  BP Readings from Last 3 Encounters:  06/24/17 (!) 162/68  06/15/17 135/68  06/08/17 128/78   Pulse Readings from Last 3 Encounters:  06/24/17 69  06/15/17 60  06/08/17 (!) 58    Physical Exam  Constitutional: She is oriented to person, place, and time. She appears well-developed and well-nourished.  HENT:  Head:  Normocephalic and atraumatic.  Eyes: Pupils are equal, round, and reactive to light. Conjunctivae are normal.  Cardiovascular: Normal rate, regular rhythm and normal heart sounds.  Pulmonary/Chest: Effort normal and breath sounds normal.  Feet:  Left Foot:  Skin Integrity: Positive for ulcer.  Neurological: She is alert and oriented to person, place, and time.  Walking with walker today    Skin: Skin is warm. Rash noted.     Psychiatric: She has a normal mood and affect. Her speech is normal and behavior is normal. Judgment and thought content normal. Cognition and memory are normal.  Nursing note and vitals reviewed.   Assessment   1. HTn uncontrolled today h/o myeolipoma masses of adrenals 08/23/12 CT  2. Eczematous rash L>R forearm  3. Right buttock/thigh pain likely MSK etiology  4. Chronic left heel wound and left achilles wound  5. N/v/d improved  6. Hypothyroidism with elevated tsh  7. HM Plan  1.  On norvasc 5 mg qd, Quinapril 40 mg qd, metoprolol 25 mg bid  Log BP at home will call pt back prev BP readings at other offices normal  Repeat BP 162/68 lower  Consider change metoprolol to coreg in future  Consider repeat CT ab/pelvis with contrast to readdress adrenal masses for size   2. Change TMC to Clobetasol  3. appt 06/30/17 Dr. Candelaria Stagers steroid inj if this does not help MRI with ortho  4. F/u Dr. [name] will send him a message to see if he thinks plastics should address wounds and grafting since has been present since 08/2016  5. Monitor for now prn Zofran  Try Align probiotics OTC, bland diet  6. Check TSH Cont levothyroxine 175 mg qd  7.  Repeat BMET today h/o elevated BUN  Had flu, pna 23, prevnar  Consider Tdap and shingrix in future  Given Rx twinrix in past to get at pharmacy h/o fatty liver   DEXA 10/17/14 normal  Ask about pap at f/u if ever had abnormal  mammo neg 07/06/16 referred for another pending pt to sch   Colonoscopy h/o polyps 02/22/17 prep not  good needs to f/u and resch with [facility] has appt 05/31/17  Had due in 5 years from 05/31/17 [facility]   Provider: Dr. Olivia Mackie [name]-Internal Medicine

## 2017-06-28 ENCOUNTER — Encounter: Payer: Self-pay | Admitting: *Deleted

## 2017-06-29 ENCOUNTER — Ambulatory Visit: Payer: PPO | Attending: Surgery | Admitting: Physical Therapy

## 2017-06-29 ENCOUNTER — Encounter: Payer: Self-pay | Admitting: Physical Therapy

## 2017-06-29 DIAGNOSIS — M6281 Muscle weakness (generalized): Secondary | ICD-10-CM | POA: Insufficient documentation

## 2017-06-29 DIAGNOSIS — M25552 Pain in left hip: Secondary | ICD-10-CM

## 2017-06-29 DIAGNOSIS — R262 Difficulty in walking, not elsewhere classified: Secondary | ICD-10-CM | POA: Insufficient documentation

## 2017-06-29 NOTE — Addendum Note (Signed)
Addended by: Alanson Puls on: 06/29/2017 05:00 PM   Modules accepted: Orders

## 2017-06-29 NOTE — Therapy (Addendum)
[place] MAIN [facility] SERVICES [address] [place], [place], [address] Phone: [phone]   Fax:  [phone]  Physical Therapy Treatment  Patient Details  Name: [name] MRN: [id number] Date of Birth: 30-Sep-1950 Referring Provider: Dr. Roland Rack   Encounter Date: 06/29/2017  PT End of Session - 06/29/17 1627    Visit Number  9    Number of Visits  17    Date for PT Re-Evaluation  08/24/17    PT Start Time  0300    PT Stop Time  0342    PT Time Calculation (min)  42 min    Equipment Utilized During Treatment  Gait belt    Activity Tolerance  Patient tolerated treatment well;Patient limited by pain;Patient limited by fatigue    Behavior During Therapy  [facility] for tasks assessed/performed       Past Medical History:  Diagnosis Date  . Abnormal antibody titer   . Anxiety and depression   . Arthritis   . Asthma   . Cystocele   . Depression   . Diabetes ([place])   . DVT (deep venous thrombosis) (HCC)    Righ calf  . Dysrhythmia   . Edema   . GERD (gastroesophageal reflux disease)   . Heart murmur    Echocardiogram June 2015: Mild MR (possible vegetation seen on TTE, not seen on TEE), normal LV size with moderate concentric LVH. Normal function EF 60-65%. Normal diastolic function. Mild LA dilation.  Marland Kitchen History of IBS   . History of methicillin resistant staphylococcus aureus (MRSA) 2008  . Hyperlipidemia   . Hypertension   . Hypothyroidism   . Insomnia   . Kidney stone    kidney stones with lithotripsy  . Neuropathy involving both lower extremities   . Obesity, Class II, BMI 35-39.9, with comorbidity   . Paroxysmal atrial fibrillation ([place]) 2007   In June 2015, Cardiac Event Monitor: Mostly SR/sinus arrhythmia with PVCs that are frequent. Short bursts of A. fib lasting several minutes;; CHA2DS2-VASc Score = 5 (age, Female, PVD, DM, HTN)  . Peripheral vascular disease ([place])   . Sleep apnea    use C-PAP  . Urinary incontinence   .  Venous stasis dermatitis of both lower extremities     Past Surgical History:  Procedure Laterality Date  . ANKLE SURGERY Right   . APPLICATION OF WOUND VAC Left 06/27/2015   Procedure: APPLICATION OF WOUND VAC ( POSSIBLE ) ;  Surgeon: [name], MD;  Location: [facility] ORS;  Service: Vascular;  Laterality: Left;  . APPLICATION OF WOUND VAC Left 09/25/2016   Procedure: APPLICATION OF WOUND VAC;  Surgeon: [name], DPM;  Location: [facility] ORS;  Service: Podiatry;  Laterality: Left;  . arm surgery     right    . CHOLECYSTECTOMY    . COLONOSCOPY    . COLONOSCOPY WITH PROPOFOL N/A 01/11/2017   Procedure: COLONOSCOPY WITH PROPOFOL;  Surgeon: [name], MD;  Location: [facility] ENDOSCOPY;  Service: Endoscopy;  Laterality: N/A;  . COLONOSCOPY WITH PROPOFOL N/A 02/22/2017   Procedure: COLONOSCOPY WITH PROPOFOL;  Surgeon: [name], MD;  Location: [facility] ENDOSCOPY;  Service: Endoscopy;  Laterality: N/A;  . COLONOSCOPY WITH PROPOFOL N/A 05/31/2017   Procedure: COLONOSCOPY WITH PROPOFOL;  Surgeon: [name], MD;  Location: [facility] ENDOSCOPY;  Service: Endoscopy;  Laterality: N/A;  . ESOPHAGOGASTRODUODENOSCOPY (EGD) WITH PROPOFOL N/A 01/11/2017   Procedure: ESOPHAGOGASTRODUODENOSCOPY (EGD) WITH PROPOFOL;  Surgeon: [name], MD;  Location: [facility]  ENDOSCOPY;  Service: Endoscopy;  Laterality: N/A;  . HERNIA REPAIR     umbilical  . HIATAL HERNIA REPAIR    . I&D EXTREMITY Left 06/27/2015   Procedure: IRRIGATION AND DEBRIDEMENT EXTREMITY            ( CALF HEMATOMA ) POSSIBLE WOUND VAC;  Surgeon: [name], MD;  Location: [facility] ORS;  Service: Vascular;  Laterality: Left;  . INCISION AND DRAINAGE OF WOUND Left 09/25/2016   Procedure: IRRIGATION AND DEBRIDEMENT - PARTIAL RESECTION OF ACHILLES TENDON WITH WOUND VAC APPLICATION;  Surgeon: [name], DPM;  Location: [facility] ORS;  Service: Podiatry;  Laterality: Left;  . IRRIGATION AND DEBRIDEMENT ABSCESS Left 09/07/2016   Procedure:  IRRIGATION AND DEBRIDEMENT ABSCESS LEFT HEEL;  Surgeon: [name], DPM;  Location: [facility] ORS;  Service: Podiatry;  Laterality: Left;  . JOINT REPLACEMENT  2013   left knee replacement  . KIDNEY SURGERY Right    kidney stones  . LITHOTRIPSY    . NM [facility] HX)  February 2017   Likely breast attenuation. LOW RISK study. Normal EF 55-60%.  . OTHER SURGICAL HISTORY     08/2016 or 09/2016 surgery on achilles tenso h/o staph infection heal. Dr. [name]  . removal of left hematoma Left    leg  . REVERSE SHOULDER ARTHROPLASTY Right 10/08/2015   Procedure: REVERSE SHOULDER ARTHROPLASTY;  Surgeon: [name], MD;  Location: [facility] ORS;  Service: Orthopedics;  Laterality: Right;  . SLEEVE GASTROPLASTY    . TONSILLECTOMY    . TOTAL KNEE ARTHROPLASTY    . TOTAL SHOULDER REPLACEMENT Right   . TRANSESOPHAGEAL ECHOCARDIOGRAM  08/08/2013   Mild LVH, EF 60-65%. Moderate LA dilation and mild RA dilation. Mild MR with no evidence of stenosis and no evidence of endocarditis. A false chordae is noted.  . TRANSTHORACIC ECHOCARDIOGRAM  08/03/2013   Mild-Moderate concentric LVH, EF 60-65%. Normal diastolic function. Mild LA dilation. Mild MR with possible vegetation  - not confirmed on TEE   . ULNAR NERVE TRANSPOSITION Right 08/06/2016   Procedure: ULNAR NERVE DECOMPRESSION/TRANSPOSITION;  Surgeon: [name], MD;  Location: [facility] ORS;  Service: Orthopedics;  Laterality: Right;  . UPPER GI ENDOSCOPY    . VAGINAL HYSTERECTOMY      There were no vitals filed for this visit.  Subjective Assessment - 06/29/17 1624    Subjective  Patient is getting ready to have a cortisone shot next week.     Patient is accompained by:  Family member    Pertinent History  Patient presents with hx of DM, PVD, peripheral neuropathy. In June 2018 patient suffered staph infection and subsequent necrosis of L Achilles tendon requiring partial resection. Patient has suffered three falls in last six months, including  one where she felt a "pop" in the hip while getting out of the bathtub. Went to MD two days later and received x-ray, negative; diagnosed with muscle strain and was recommended exercises. ER visit on 1/28 following third fall onto L hip, diagnosed with hematoma. Patient reports reduced sensation in feet L > R. Requires assistance to get into and out of bed since tendon surgery. Patient uses spc for ambulation and reports that small steps do not cause pain.    Limitations  Walking;Sitting;Standing;Lifting;House hold activities    How long can you sit comfortably?  1 hr    How long can you stand comfortably?  n/a    How long can you walk comfortably?  40 min  Diagnostic tests  x-ray, negative, CT scan showed torn muscle    Patient Stated Goals  Improve balance, reduce fall risk, and can go somewhere without FOF    Currently in Pain?  Yes    Pain Score  4     Pain Location  Hip    Pain Orientation  Right    Pain Descriptors / Indicators  Shooting    Pain Type  Chronic pain    Pain Onset  1 to 4 weeks ago    Pain Frequency  Intermittent    Aggravating Factors   sit to stand     Pain Relieving Factors  rest, medication    Pain Onset  1 to 4 weeks ago       Treatments:  Sit to stand from various heights x 5  Standing squats x 10  Step ups to 5 inch stool with UE support x 10   Gait training with rollator 150 feet x 3 with decreased gait speed and left weight bearing guarding shoe.                        PT Education - 06/29/17 1626    Education provided  Yes    Education Details  exercise    Person(s) Educated  Patient    Methods  Explanation;Demonstration;Tactile cues;Verbal cues    Comprehension  Verbalized understanding;Returned demonstration       PT Short Term Goals - 04/26/17 1502      PT SHORT TERM GOAL #1   Title  Patient will be independent in home exercise program to improve strength/mobility for better functional independence with bed mobility and  ADLs.    Baseline  No HEP    Time  4    Period  Weeks    Status  New    Target Date  05/24/17      PT SHORT TERM GOAL #2   Title  Patient will report a worst pain score of 3/10 VAS in the L hip to improve tolerance with ADLs and reduce symptoms with bed mobility activities.      Baseline  6/10 with medication    Time  4    Period  Weeks    Status  New    Target Date  05/24/17      PT SHORT TERM GOAL #3   Title  Patient will demonstrate sit to supine bed mobility, supine to sit bed mobility, and rolling L and R with minimal assistance for improved functional mobility and independence    Baseline  Patient requires mod-max A for bed mobility    Time  4    Period  Weeks    Status  New    Target Date  05/24/17        PT Long Term Goals - 06/29/17 1649      PT LONG TERM GOAL #1   Title  Patient (>26 years old) will improve 10-meter walk test to 0.6 meters/sec with least restrictive AD to demonstrate reduced fall risk and capacity for limited community ambulation.    Baseline  0.21 meters/sec, 05/25/17 = .21 m/sec, . 40 m/sec 06/29/17    Time  8    Period  Weeks    Status  On-going    Target Date  08/24/17      PT LONG TERM GOAL #2   Title  Patient will improve LEFS score to 40/80 to demonstrate improved functional mobility and independence with ADLs  Baseline  9/80; 05/25/17= 12/80, 20/80 06/29/17    Time  8    Period  Weeks    Status  On-going    Target Date  08/24/17      PT LONG TERM GOAL #3   Title  Patient will demonstrate ability to perform bed mobility independently for improved functional mobility and independence.    Baseline  Patient is independent with bed mobitiy     Time  8    Period  Weeks    Status  Achieved      PT LONG TERM GOAL #4   Title  Patient will increase six minute walk test distance to >1000 for progression to community ambulator and improve gait ability    Baseline  225 ft    Time  8    Period  Weeks    Status  New    Target Date  07/27/17             Plan - 06/29/17 1628    Clinical Impression Statement  Pt requires direction and verbal cues for correct performance of exercises. Patient demonstrates LOB with   strengthening exercises indicating decreased core strength. Pt was able to begin exercises today, noting poor endurance and poor core strength.  Pt was able to perform all exercises with min assist and VC for technique..   Patient struggles with speed during movement as well as balance with unstable surfaces. Pt encouraged to continue HEP .Follow-up as scheduled.    Rehab Potential  Fair    Clinical Impairments Affecting Rehab Potential  - comorbidities, age, severity / + family support, acute injury    PT Frequency  2x / week    PT Duration  8 weeks    PT Treatment/Interventions  Aquatic Therapy;ADLs/Self Care Home Management;Cryotherapy;Electrical Stimulation;Moist Heat;Ultrasound;DME Instruction;Gait training;Stair training;Functional mobility training;Therapeutic activities;Therapeutic exercise;Balance training;Neuromuscular re-education;Patient/family education;Manual techniques;Compression bandaging;Scar mobilization;Passive range of motion;Dry needling;Energy conservation;Taping    PT Next Visit Plan  LE strengthening    Consulted and Agree with Plan of Care  Patient       Patient will benefit from skilled therapeutic intervention in order to improve the following deficits and impairments:  Abnormal gait, Decreased activity tolerance, Decreased balance, Decreased endurance, Decreased coordination, Decreased mobility, Decreased range of motion, Decreased safety awareness, Difficulty walking, Decreased strength, Hypomobility, Impaired perceived functional ability, Impaired UE functional use, Obesity, Impaired flexibility, Pain  Visit Diagnosis: Difficulty in walking, not elsewhere classified  Muscle weakness (generalized)  Pain in left hip     Problem List Patient Active Problem List   Diagnosis Date Noted  .  Eczema 06/27/2017  . Diabetic ulcer of left foot ([place]) 06/24/2017  . Mass of both adrenal glands ([place]) 05/16/2017  . Fatty liver 05/13/2017  . Pain of left hip joint 04/22/2017  . Abnormality of gait and mobility 04/22/2017  . Fall with injury 02/02/2017  . Anemia 11/18/2016  . Asthma 11/18/2016  . Carpal tunnel syndrome 11/18/2016  . Pain in joint involving ankle and foot 11/18/2016  . Obstructive sleep apnea of adult 11/18/2016  . Spinal stenosis of thoracic region 11/18/2016  . Abscess of tendon of left foot 09/21/2016  . Cubital tunnel syndrome on right 08/07/2016  . Chronic pain syndrome 02/25/2016  . Long term current use of opiate analgesic 02/25/2016  . Chronic right shoulder pain 02/25/2016  . Status post reverse total shoulder replacement 10/08/2015  . 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 Morbid obesity with BMI of 40.0-44.9,  adult ([place]) 09/23/2015  . Chronic prescription benzodiazepine use 09/23/2015  . Osteoarthritis, multiple sites 09/23/2015  . Other shoulder lesions (Right) 08/28/2015  . Cervical radiculitis 08/06/2015  . Opiate use (15 MME/Day) 01/16/2015  . Impingement syndrome of shoulder region 01/07/2015  . Mixed incontinence 11/26/2014  . Atrophic vaginitis 11/26/2014  . Anxiety and depression 09/21/2014  . Bladder cystocele 09/21/2014  . Gastro-esophageal reflux disease without esophagitis 09/21/2014  . DM (diabetes mellitus) type II, controlled, with peripheral vascular disorder ([place]) 08/31/2014  . Diabetic peripheral neuropathy associated with type 2 diabetes mellitus ([place]) 08/31/2014  . Asthma, moderate persistent, well-controlled 08/31/2014  . Hypothyroidism, adult 08/31/2014  . Peripheral vascular disease due to secondary diabetes mellitus ([place]) 12/11/2013  . Apnea, sleep 10/19/2013  . Paroxysmal atrial fibrillation ([place]); CHA2DS2-Vasc Score 5. On Pradaxa 07/27/2013  . Essential hypertension 07/27/2013    Alanson Puls, [place] DPT 06/29/2017, 4:56 PM  [place] MAIN [facility] SERVICES [address] [place], [place], [address] Phone: [phone]   Fax:  [phone]  Name: [name] MRN: [id number] Date of Birth: 1950-07-12

## 2017-06-30 DIAGNOSIS — G8929 Other chronic pain: Secondary | ICD-10-CM | POA: Diagnosis not present

## 2017-06-30 DIAGNOSIS — M25551 Pain in right hip: Secondary | ICD-10-CM | POA: Diagnosis not present

## 2017-07-01 ENCOUNTER — Ambulatory Visit: Payer: PPO | Admitting: Physical Therapy

## 2017-07-01 NOTE — Telephone Encounter (Signed)
Unread mychart message mailed to patient 

## 2017-07-06 ENCOUNTER — Encounter: Payer: Self-pay | Admitting: Physical Therapy

## 2017-07-06 ENCOUNTER — Ambulatory Visit: Payer: PPO | Admitting: Physical Therapy

## 2017-07-06 DIAGNOSIS — M6281 Muscle weakness (generalized): Secondary | ICD-10-CM

## 2017-07-06 DIAGNOSIS — R262 Difficulty in walking, not elsewhere classified: Secondary | ICD-10-CM

## 2017-07-06 DIAGNOSIS — M25552 Pain in left hip: Secondary | ICD-10-CM

## 2017-07-06 NOTE — Therapy (Signed)
[place] MAIN [facility] SERVICES [address] [place], [place], [address] Phone: [phone]   Fax:  [phone]  Physical Therapy Treatment  Physical Therapy Progress Note   Dates of reporting period 04/26/17   to   07/06/17   Patient Details  Name: [name] MRN: [id number] Date of Birth: 1950-04-28 Referring Provider: Dr. Roland Rack   Encounter Date: 07/06/2017  PT End of Session - 07/06/17 1512    Visit Number  10    Number of Visits  17    Date for PT Re-Evaluation  08/24/17    PT Start Time  0300    PT Stop Time  0345    PT Time Calculation (min)  45 min    Equipment Utilized During Treatment  Gait belt    Activity Tolerance  Patient tolerated treatment well;Patient limited by pain;Patient limited by fatigue    Behavior During Therapy  [facility] for tasks assessed/performed       Past Medical History:  Diagnosis Date  . Abnormal antibody titer   . Anxiety and depression   . Arthritis   . Asthma   . Cystocele   . Depression   . Diabetes ([place])   . DVT (deep venous thrombosis) (HCC)    Righ calf  . Dysrhythmia   . Edema   . GERD (gastroesophageal reflux disease)   . Heart murmur    Echocardiogram June 2015: Mild MR (possible vegetation seen on TTE, not seen on TEE), normal LV size with moderate concentric LVH. Normal function EF 60-65%. Normal diastolic function. Mild LA dilation.  Marland Kitchen History of IBS   . History of methicillin resistant staphylococcus aureus (MRSA) 2008  . Hyperlipidemia   . Hypertension   . Hypothyroidism   . Insomnia   . Kidney stone    kidney stones with lithotripsy  . Neuropathy involving both lower extremities   . Obesity, Class II, BMI 35-39.9, with comorbidity   . Paroxysmal atrial fibrillation ([place]) 2007   In June 2015, Cardiac Event Monitor: Mostly SR/sinus arrhythmia with PVCs that are frequent. Short bursts of A. fib lasting several minutes;; CHA2DS2-VASc Score = 5 (age, Female, PVD, DM, HTN)  .  Peripheral vascular disease ([place])   . Sleep apnea    use C-PAP  . Urinary incontinence   . Venous stasis dermatitis of both lower extremities     Past Surgical History:  Procedure Laterality Date  . ANKLE SURGERY Right   . APPLICATION OF WOUND VAC Left 06/27/2015   Procedure: APPLICATION OF WOUND VAC ( POSSIBLE ) ;  Surgeon: [name], MD;  Location: [facility] ORS;  Service: Vascular;  Laterality: Left;  . APPLICATION OF WOUND VAC Left 09/25/2016   Procedure: APPLICATION OF WOUND VAC;  Surgeon: [name], DPM;  Location: [facility] ORS;  Service: Podiatry;  Laterality: Left;  . arm surgery     right    . CHOLECYSTECTOMY    . COLONOSCOPY    . COLONOSCOPY WITH PROPOFOL N/A 01/11/2017   Procedure: COLONOSCOPY WITH PROPOFOL;  Surgeon: [name], MD;  Location: [facility] ENDOSCOPY;  Service: Endoscopy;  Laterality: N/A;  . COLONOSCOPY WITH PROPOFOL N/A 02/22/2017   Procedure: COLONOSCOPY WITH PROPOFOL;  Surgeon: [name], MD;  Location: [facility] ENDOSCOPY;  Service: Endoscopy;  Laterality: N/A;  . COLONOSCOPY WITH PROPOFOL N/A 05/31/2017   Procedure: COLONOSCOPY WITH PROPOFOL;  Surgeon: [name], MD;  Location: [facility] ENDOSCOPY;  Service: Endoscopy;  Laterality: N/A;  . ESOPHAGOGASTRODUODENOSCOPY (EGD) WITH  PROPOFOL N/A 01/11/2017   Procedure: ESOPHAGOGASTRODUODENOSCOPY (EGD) WITH PROPOFOL;  Surgeon: [name], MD;  Location: [facility] ENDOSCOPY;  Service: Endoscopy;  Laterality: N/A;  . HERNIA REPAIR     umbilical  . HIATAL HERNIA REPAIR    . I&D EXTREMITY Left 06/27/2015   Procedure: IRRIGATION AND DEBRIDEMENT EXTREMITY            ( CALF HEMATOMA ) POSSIBLE WOUND VAC;  Surgeon: [name], MD;  Location: [facility] ORS;  Service: Vascular;  Laterality: Left;  . INCISION AND DRAINAGE OF WOUND Left 09/25/2016   Procedure: IRRIGATION AND DEBRIDEMENT - PARTIAL RESECTION OF ACHILLES TENDON WITH WOUND VAC APPLICATION;  Surgeon: [name], DPM;  Location: [facility] ORS;  Service:  Podiatry;  Laterality: Left;  . IRRIGATION AND DEBRIDEMENT ABSCESS Left 09/07/2016   Procedure: IRRIGATION AND DEBRIDEMENT ABSCESS LEFT HEEL;  Surgeon: [name], DPM;  Location: [facility] ORS;  Service: Podiatry;  Laterality: Left;  . JOINT REPLACEMENT  2013   left knee replacement  . KIDNEY SURGERY Right    kidney stones  . LITHOTRIPSY    . NM [facility] HX)  February 2017   Likely breast attenuation. LOW RISK study. Normal EF 55-60%.  . OTHER SURGICAL HISTORY     08/2016 or 09/2016 surgery on achilles tenso h/o staph infection heal. Dr. [name]  . removal of left hematoma Left    leg  . REVERSE SHOULDER ARTHROPLASTY Right 10/08/2015   Procedure: REVERSE SHOULDER ARTHROPLASTY;  Surgeon: [name], MD;  Location: [facility] ORS;  Service: Orthopedics;  Laterality: Right;  . SLEEVE GASTROPLASTY    . TONSILLECTOMY    . TOTAL KNEE ARTHROPLASTY    . TOTAL SHOULDER REPLACEMENT Right   . TRANSESOPHAGEAL ECHOCARDIOGRAM  68/19/2015   Mild LVH, EF 60-65%. Moderate LA dilation and mild RA dilation. Mild MR with no evidence of stenosis and no evidence of endocarditis. A false chordae is noted.  . TRANSTHORACIC ECHOCARDIOGRAM  08/03/2013   Mild-Moderate concentric LVH, EF 60-65%. Normal diastolic function. Mild LA dilation. Mild MR with possible vegetation  - not confirmed on TEE   . ULNAR NERVE TRANSPOSITION Right 08/06/2016   Procedure: ULNAR NERVE DECOMPRESSION/TRANSPOSITION;  Surgeon: [name], MD;  Location: [facility] ORS;  Service: Orthopedics;  Laterality: Right;  . UPPER GI ENDOSCOPY    . VAGINAL HYSTERECTOMY      There were no vitals filed for this visit.  Subjective Assessment - 07/06/17 1509    Subjective  Patient is having cellulitis in BLE and it looks red and very broken out.    Patient is accompained by:  Family member    Pertinent History  Patient presents with hx of DM, PVD, peripheral neuropathy. In June 2018 patient suffered staph infection and subsequent necrosis of L  Achilles tendon requiring partial resection. Patient has suffered three falls in last six months, including one where she felt a "pop" in the hip while getting out of the bathtub. Went to [facility] two days later and received x-ray, negative; diagnosed with muscle strain and was recommended exercises. ER visit on 1/28 following third fall onto L hip, diagnosed with hematoma. Patient reports reduced sensation in feet L > R. Requires assistance to get into and out of bed since tendon surgery. Patient uses spc for ambulation and reports that small steps do not cause pain.    Limitations  Walking;Sitting;Standing;Lifting;House hold activities    How long can you sit comfortably?  1 hr    How long  can you stand comfortably?  n/a    How long can you walk comfortably?  40 min    Diagnostic tests  x-ray, negative, CT scan showed torn muscle    Patient Stated Goals  Improve balance, reduce fall risk, and can go somewhere without FOF    Currently in Pain?  Yes    Pain Score  4     Pain Location  Buttocks    Pain Orientation  Right    Pain Descriptors / Indicators  Tender    Pain Type  Chronic pain    Pain Onset  1 to 4 weeks ago    Pain Frequency  Intermittent    Aggravating Factors   sitting    Pain Relieving Factors  rest    Pain Onset  1 to 4 weeks ago      Therapeutic activities;  Outcome measures performed including LEFS,10 MW, 6 MW, and goals assessed including HEP goal,  bed mobility goal, and pain goal. Patient is making progress with every goal but has had some medical set backs due to a fall early on in treatment plan causing her right buttocks to hurt, and a recent onset of cellulitis, and her long term left foot wound that limits her mobility.  See goals for specific results for the outcome measures performed today. HEP was reviewed today and safety with mobility.                          PT Education - 07/06/17 1512    Education provided  Yes    Education Details   exercise    Person(s) Educated  Patient    Methods  Explanation;Demonstration;Tactile cues    Comprehension  Verbalized understanding;Returned demonstration       PT Short Term Goals - 07/06/17 1519      PT SHORT TERM GOAL #1   Title  Patient will be independent in home exercise program to improve strength/mobility for better functional independence with bed mobility and ADLs.    Baseline  No HEP    Time  4    Period  Weeks    Status  On-going    Target Date  07/06/17      PT SHORT TERM GOAL #2   Title  Patient will report a worst pain score of 3/10 VAS in the L hip to improve tolerance with ADLs and reduce symptoms with bed mobility activities.      Baseline  6/10 with medication; 07/06/17 4/10 right hip buttocks    Time  4    Period  Weeks    Status  Partially Met    Target Date  07/06/17      PT SHORT TERM GOAL #3   Title  Patient will demonstrate sit to supine bed mobility, supine to sit bed mobility, and rolling L and R with minimal assistance for improved functional mobility and independence    Baseline  Patient requires mod-max A for bed mobility; independent with more time    Time  4    Period  Weeks    Status  Achieved    Target Date  07/06/17        PT Long Term Goals - 07/06/17 1513      PT LONG TERM GOAL #1   Title  Patient (>67 years old) will improve 10-meter walk test to 0.6 meters/sec with least restrictive AD to demonstrate reduced fall risk and capacity for limited community ambulation.  Baseline  0.21 meters/sec, 05/25/17 = .21 m/sec, . 40 m/sec 06/29/17; 07/06/17;  .40 m/sec    Time  8    Period  Weeks    Status  Partially Met    Target Date  08/24/17      PT LONG TERM GOAL #2   Title  Patient will improve LEFS score to 40/80 to demonstrate improved functional mobility and independence with ADLs    Baseline  9/80; 05/25/17= 12/80, 20/80 06/29/17; 07/06/17:29/80     Time  8    Period  Weeks    Status  On-going    Target Date  08/24/17      PT LONG TERM  GOAL #3   Title  Patient will demonstrate ability to perform bed mobility independently for improved functional mobility and independence.    Baseline  Patient is independent with bed mobitiy     Time  8    Period  Weeks    Status  Achieved    Target Date  06/21/17      PT LONG TERM GOAL #4   Title  Patient will increase six minute walk test distance to >1000 for progression to community ambulator and improve gait ability    Baseline  225 ft; 07/06/17    Time  8    Period  Weeks    Status  On-going    Target Date  08/24/17            Plan - 07/06/17 1606    Clinical Impression Statement  Patient's condition has the potential to improve in response to therapy. Maximum improvement is yet to be obtained. The anticipated improvement is attainable and reasonable in a generally predictable time. Start date of reporting period 04/26/17 end date of reporting period 07/07/17.  Patient reports that she is able to walk around her home much better and is not as fearful of falling. She is not able to get up out of bed without help with extra time.  Patient demonstrates LE weakness, and impaired gait and lack of mobility  that is causing difficulty with transfers and bed mobility activities. Patient still fatigues quickly to due very weak LE's.  Patient showed poor stepping technique and is able to generate good form after heavy cueing. Patient will continue to benefit from skilled physical therapy to improve LE strength and mobility to improve quality of life.     Rehab Potential  Fair    Clinical Impairments Affecting Rehab Potential  - comorbidities, age, severity / + family support, acute injury    PT Frequency  2x / week    PT Duration  8 weeks    PT Treatment/Interventions  Aquatic Therapy;ADLs/Self Care Home Management;Cryotherapy;Electrical Stimulation;Moist Heat;Ultrasound;DME Instruction;Gait training;Stair training;Functional mobility training;Therapeutic activities;Therapeutic exercise;Balance  training;Neuromuscular re-education;Patient/family education;Manual techniques;Compression bandaging;Scar mobilization;Passive range of motion;Dry needling;Energy conservation;Taping    PT Next Visit Plan  LE strengthening    Consulted and Agree with Plan of Care  Patient       Patient will benefit from skilled therapeutic intervention in order to improve the following deficits and impairments:  Abnormal gait, Decreased activity tolerance, Decreased balance, Decreased endurance, Decreased coordination, Decreased mobility, Decreased range of motion, Decreased safety awareness, Difficulty walking, Decreased strength, Hypomobility, Impaired perceived functional ability, Impaired UE functional use, Obesity, Impaired flexibility, Pain  Visit Diagnosis: Difficulty in walking, not elsewhere classified  Muscle weakness (generalized)  Pain in left hip     Problem List Patient Active Problem List   Diagnosis Date  Noted  . Eczema 06/27/2017  . Diabetic ulcer of left foot ([place]) 06/24/2017  . Mass of both adrenal glands ([place]) 05/16/2017  . Fatty liver 05/13/2017  . Pain of left hip joint 04/22/2017  . Abnormality of gait and mobility 04/22/2017  . Fall with injury 02/02/2017  . Anemia 11/18/2016  . Asthma 11/18/2016  . Carpal tunnel syndrome 11/18/2016  . Pain in joint involving ankle and foot 11/18/2016  . Obstructive sleep apnea of adult 11/18/2016  . Spinal stenosis of thoracic region 11/18/2016  . Abscess of tendon of left foot 09/21/2016  . Cubital tunnel syndrome on right 08/07/2016  . Chronic pain syndrome 02/25/2016  . Long term current use of opiate analgesic 02/25/2016  . Chronic right shoulder pain 02/25/2016  . Status post reverse total shoulder replacement 10/08/2015  . 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 Morbid obesity with BMI of 40.0-44.9, adult ([place]) 09/23/2015  . Chronic prescription benzodiazepine use 09/23/2015  . Osteoarthritis,  multiple sites 09/23/2015  . Other shoulder lesions (Right) 08/28/2015  . Cervical radiculitis 08/06/2015  . Opiate use (15 MME/Day) 01/16/2015  . Impingement syndrome of shoulder region 01/07/2015  . Mixed incontinence 11/26/2014  . Atrophic vaginitis 11/26/2014  . Anxiety and depression 09/21/2014  . Bladder cystocele 09/21/2014  . Gastro-esophageal reflux disease without esophagitis 09/21/2014  . DM (diabetes mellitus) type II, controlled, with peripheral vascular disorder ([place]) 08/31/2014  . Diabetic peripheral neuropathy associated with type 2 diabetes mellitus ([place]) 08/31/2014  . Asthma, moderate persistent, well-controlled 08/31/2014  . Hypothyroidism, adult 08/31/2014  . Peripheral vascular disease due to secondary diabetes mellitus ([place]) 12/11/2013  . Apnea, sleep 10/19/2013  . Paroxysmal atrial fibrillation ([place]); CHA2DS2-Vasc Score 5. On Pradaxa 07/27/2013  . Essential hypertension 07/27/2013    Alanson Puls, [place] DPT 07/06/2017, 4:21 PM  [place] MAIN [facility] SERVICES [address] [place], [place], [address] Phone: [phone]   Fax:  [phone]  Name: Sonia [place]ell MRN: [id number] Date of Birth: April 26, 1950

## 2017-07-07 DIAGNOSIS — E1143 Type 2 diabetes mellitus with diabetic autonomic (poly)neuropathy: Secondary | ICD-10-CM | POA: Diagnosis not present

## 2017-07-07 DIAGNOSIS — L97521 Non-pressure chronic ulcer of other part of left foot limited to breakdown of skin: Secondary | ICD-10-CM | POA: Diagnosis not present

## 2017-07-08 ENCOUNTER — Ambulatory Visit: Payer: PPO | Admitting: Physical Therapy

## 2017-07-13 ENCOUNTER — Encounter: Payer: Self-pay | Admitting: Physical Therapy

## 2017-07-13 ENCOUNTER — Ambulatory Visit: Payer: PPO | Admitting: Physical Therapy

## 2017-07-13 DIAGNOSIS — R262 Difficulty in walking, not elsewhere classified: Secondary | ICD-10-CM

## 2017-07-13 DIAGNOSIS — M25552 Pain in left hip: Secondary | ICD-10-CM

## 2017-07-13 DIAGNOSIS — M6281 Muscle weakness (generalized): Secondary | ICD-10-CM

## 2017-07-13 NOTE — Therapy (Signed)
[place] MAIN [facility] SERVICES [address] [place], [place], [address] Phone: [phone]   Fax:  [phone]  Physical Therapy Treatment  Patient Details  Name: [name] MRN: [id number] Date of Birth: Oct 11, 1950 Referring Provider: Dr. Roland Rack   Encounter Date: 07/13/2017  PT End of Session - 07/13/17 1455    Visit Number  11    Number of Visits  17    Date for PT Re-Evaluation  08/24/17    PT Start Time  0240    PT Stop Time  0318    PT Time Calculation (min)  38 min    Equipment Utilized During Treatment  Gait belt    Activity Tolerance  Patient tolerated treatment well;Patient limited by pain;Patient limited by fatigue    Behavior During Therapy  [facility] for tasks assessed/performed       Past Medical History:  Diagnosis Date  . Abnormal antibody titer   . Anxiety and depression   . Arthritis   . Asthma   . Cystocele   . Depression   . Diabetes ([place])   . DVT (deep venous thrombosis) (HCC)    Righ calf  . Dysrhythmia   . Edema   . GERD (gastroesophageal reflux disease)   . Heart murmur    Echocardiogram June 2015: Mild MR (possible vegetation seen on TTE, not seen on TEE), normal LV size with moderate concentric LVH. Normal function EF 60-65%. Normal diastolic function. Mild LA dilation.  Marland Kitchen History of IBS   . History of methicillin resistant staphylococcus aureus (MRSA) 2008  . Hyperlipidemia   . Hypertension   . Hypothyroidism   . Insomnia   . Kidney stone    kidney stones with lithotripsy  . Neuropathy involving both lower extremities   . Obesity, Class II, BMI 35-39.9, with comorbidity   . Paroxysmal atrial fibrillation ([place]) 2007   In June 2015, Cardiac Event Monitor: Mostly SR/sinus arrhythmia with PVCs that are frequent. Short bursts of A. fib lasting several minutes;; CHA2DS2-VASc Score = 5 (age, Female, PVD, DM, HTN)  . Peripheral vascular disease ([place])   . Sleep apnea    use C-PAP  . Urinary incontinence   .  Venous stasis dermatitis of both lower extremities     Past Surgical History:  Procedure Laterality Date  . ANKLE SURGERY Right   . APPLICATION OF WOUND VAC Left 06/27/2015   Procedure: APPLICATION OF WOUND VAC ( POSSIBLE ) ;  Surgeon: [name], MD;  Location: [facility] ORS;  Service: Vascular;  Laterality: Left;  . APPLICATION OF WOUND VAC Left 09/25/2016   Procedure: APPLICATION OF WOUND VAC;  Surgeon: [name], DPM;  Location: [facility] ORS;  Service: Podiatry;  Laterality: Left;  . arm surgery     right    . CHOLECYSTECTOMY    . COLONOSCOPY    . COLONOSCOPY WITH PROPOFOL N/A 01/11/2017   Procedure: COLONOSCOPY WITH PROPOFOL;  Surgeon: [name], MD;  Location: [facility] ENDOSCOPY;  Service: Endoscopy;  Laterality: N/A;  . COLONOSCOPY WITH PROPOFOL N/A 02/22/2017   Procedure: COLONOSCOPY WITH PROPOFOL;  Surgeon: [name], MD;  Location: [facility] ENDOSCOPY;  Service: Endoscopy;  Laterality: N/A;  . COLONOSCOPY WITH PROPOFOL N/A 05/31/2017   Procedure: COLONOSCOPY WITH PROPOFOL;  Surgeon: [name], MD;  Location: [facility] ENDOSCOPY;  Service: Endoscopy;  Laterality: N/A;  . ESOPHAGOGASTRODUODENOSCOPY (EGD) WITH PROPOFOL N/A 01/11/2017   Procedure: ESOPHAGOGASTRODUODENOSCOPY (EGD) WITH PROPOFOL;  Surgeon: [name], MD;  Location: [facility]  ENDOSCOPY;  Service: Endoscopy;  Laterality: N/A;  . HERNIA REPAIR     umbilical  . HIATAL HERNIA REPAIR    . I&D EXTREMITY Left 06/27/2015   Procedure: IRRIGATION AND DEBRIDEMENT EXTREMITY            ( CALF HEMATOMA ) POSSIBLE WOUND VAC;  Surgeon: [name], MD;  Location: [facility] ORS;  Service: Vascular;  Laterality: Left;  . INCISION AND DRAINAGE OF WOUND Left 09/25/2016   Procedure: IRRIGATION AND DEBRIDEMENT - PARTIAL RESECTION OF ACHILLES TENDON WITH WOUND VAC APPLICATION;  Surgeon: [name], DPM;  Location: [facility] ORS;  Service: Podiatry;  Laterality: Left;  . IRRIGATION AND DEBRIDEMENT ABSCESS Left 09/07/2016   Procedure:  IRRIGATION AND DEBRIDEMENT ABSCESS LEFT HEEL;  Surgeon: [name], DPM;  Location: [facility] ORS;  Service: Podiatry;  Laterality: Left;  . JOINT REPLACEMENT  2013   left knee replacement  . KIDNEY SURGERY Right    kidney stones  . LITHOTRIPSY    . NM [facility] HX)  February 2017   Likely breast attenuation. LOW RISK study. Normal EF 55-60%.  . OTHER SURGICAL HISTORY     08/2016 or 09/2016 surgery on achilles tenso h/o staph infection heal. Dr. [name]  . removal of left hematoma Left    leg  . REVERSE SHOULDER ARTHROPLASTY Right 10/08/2015   Procedure: REVERSE SHOULDER ARTHROPLASTY;  Surgeon: [name], MD;  Location: [facility] ORS;  Service: Orthopedics;  Laterality: Right;  . SLEEVE GASTROPLASTY    . TONSILLECTOMY    . TOTAL KNEE ARTHROPLASTY    . TOTAL SHOULDER REPLACEMENT Right   . TRANSESOPHAGEAL ECHOCARDIOGRAM  08/08/2013   Mild LVH, EF 60-65%. Moderate LA dilation and mild RA dilation. Mild MR with no evidence of stenosis and no evidence of endocarditis. A false chordae is noted.  . TRANSTHORACIC ECHOCARDIOGRAM  08/03/2013   Mild-Moderate concentric LVH, EF 60-65%. Normal diastolic function. Mild LA dilation. Mild MR with possible vegetation  - not confirmed on TEE   . ULNAR NERVE TRANSPOSITION Right 08/06/2016   Procedure: ULNAR NERVE DECOMPRESSION/TRANSPOSITION;  Surgeon: [name], MD;  Location: [facility] ORS;  Service: Orthopedics;  Laterality: Right;  . UPPER GI ENDOSCOPY    . VAGINAL HYSTERECTOMY      There were no vitals filed for this visit.  Subjective Assessment - 07/13/17 1446    Subjective  Patient is having cellulitis in BLE and it looks red and very broken out. She is going to have an IT trainer.     Patient is accompained by:  Family member    Pertinent History  Patient presents with hx of DM, PVD, peripheral neuropathy. In June 2018 patient suffered staph infection and subsequent necrosis of L Achilles tendon requiring partial resection. Patient has  suffered three falls in last six months, including one where she felt a "pop" in the hip while getting out of the bathtub. Went to MD two days later and received x-ray, negative; diagnosed with muscle strain and was recommended exercises. ER visit on 1/28 following third fall onto L hip, diagnosed with hematoma. Patient reports reduced sensation in feet L > R. Requires assistance to get into and out of bed since tendon surgery. Patient uses spc for ambulation and reports that small steps do not cause pain.    Limitations  Walking;Sitting;Standing;Lifting;House hold activities    How long can you sit comfortably?  1 hr    How long can you stand comfortably?  n/a    How  long can you walk comfortably?  40 min    Diagnostic tests  x-ray, negative, CT scan showed torn muscle    Patient Stated Goals  Improve balance, reduce fall risk, and can go somewhere without FOF    Currently in Pain?  Yes    Pain Score  4     Pain Location  Buttocks    Pain Orientation  Right    Pain Descriptors / Indicators  Tender    Pain Type  Chronic pain    Pain Onset  1 to 4 weeks ago    Pain Frequency  Intermittent    Aggravating Factors   sitting    Pain Relieving Factors  rest    Effect of Pain on Daily Activities  unable to walk as far    Pain Onset  1 to 4 weeks ago           Therex: Seated LAQ: RLE 2.5lbs 2x10;cues to perform slowly for maximum strengthening 0lbs LAQ 2x10, cues to complete full ROM Seated hamstring curl yellow band 2x10 BLE, cues to pull into full ROM Supine march in hooklying with TA,  2x10 with cues for increased hip flexion 2x10 Supine bridge with cues to stabilize pelvis 2x10 Supine SLR 4x5 BLE  Supine hip abd/add with cues to keep L leg in neutral and not to ER sidelying clamshell 3x5 BLE, cue to perform slowly sidelying hip abduction with assist 3x5 BLE Sit to stand from elevated mat table 3x5 with cues for hip abduction   Encouraged to keep upright posture with ambulation and VC  to eccentrically control leg with SLR , VC throughout with demonstration to keep LE aligned with body to maintain hip in neutral during abduction                   PT Education - 07/13/17 1455    Education provided  Yes    Education Details  exericse technique    Person(s) Educated  Patient    Methods  Explanation    Comprehension  Verbalized understanding;Returned demonstration       PT Short Term Goals - 07/06/17 1519      PT SHORT TERM GOAL #1   Title  Patient will be independent in home exercise program to improve strength/mobility for better functional independence with bed mobility and ADLs.    Baseline  No HEP    Time  4    Period  Weeks    Status  On-going    Target Date  07/06/17      PT SHORT TERM GOAL #2   Title  Patient will report a worst pain score of 3/10 VAS in the L hip to improve tolerance with ADLs and reduce symptoms with bed mobility activities.      Baseline  6/10 with medication; 07/06/17 4/10 right hip buttocks    Time  4    Period  Weeks    Status  Partially Met    Target Date  07/06/17      PT SHORT TERM GOAL #3   Title  Patient will demonstrate sit to supine bed mobility, supine to sit bed mobility, and rolling L and R with minimal assistance for improved functional mobility and independence    Baseline  Patient requires mod-max A for bed mobility; independent with more time    Time  4    Period  Weeks    Status  Achieved    Target Date  07/06/17  PT Long Term Goals - 07/06/17 1513      PT LONG TERM GOAL #1   Title  Patient (>12 years old) will improve 10-meter walk test to 0.6 meters/sec with least restrictive AD to demonstrate reduced fall risk and capacity for limited community ambulation.    Baseline  0.21 meters/sec, 05/25/17 = .21 m/sec, . 40 m/sec 06/29/17; 07/06/17;  .40 m/sec    Time  8    Period  Weeks    Status  Partially Met    Target Date  08/24/17      PT LONG TERM GOAL #2   Title  Patient will improve LEFS  score to 40/80 to demonstrate improved functional mobility and independence with ADLs    Baseline  9/80; 05/25/17= 12/80, 20/80 06/29/17; 07/06/17:29/80     Time  8    Period  Weeks    Status  On-going    Target Date  08/24/17      PT LONG TERM GOAL #3   Title  Patient will demonstrate ability to perform bed mobility independently for improved functional mobility and independence.    Baseline  Patient is independent with bed mobitiy     Time  8    Period  Weeks    Status  Achieved    Target Date  06/21/17      PT LONG TERM GOAL #4   Title  Patient will increase six minute walk test distance to >1000 for progression to community ambulator and improve gait ability    Baseline  225 ft; 07/06/17    Time  8    Period  Weeks    Status  On-going    Target Date  08/24/17            Plan - 07/13/17 1534    Clinical Impression Statement  Patient demonstrates LE weakness, and impaired  mobility and lack of  LE strength   that is causing difficulty with transfers and gait ativities.  Patient still fatigues quickly to due very weak glut and quad muscles. Patient showed poor form and technique and is able to generate good form after heavy cueing She is able to perform exercises that were not possible several weeks ago and she is able to be independent with mat mobility and transfers look much steadier.. Patient will continue to benefit from skilled physical therapy to improve xxx strength and xxx to improve quality of life.     Rehab Potential  Fair    Clinical Impairments Affecting Rehab Potential  - comorbidities, age, severity / + family support, acute injury    PT Frequency  2x / week    PT Duration  8 weeks    PT Treatment/Interventions  Aquatic Therapy;ADLs/Self Care Home Management;Cryotherapy;Electrical Stimulation;Moist Heat;Ultrasound;DME Instruction;Gait training;Stair training;Functional mobility training;Therapeutic activities;Therapeutic exercise;Balance training;Neuromuscular  re-education;Patient/family education;Manual techniques;Compression bandaging;Scar mobilization;Passive range of motion;Dry needling;Energy conservation;Taping    PT Next Visit Plan  LE strengthening    Consulted and Agree with Plan of Care  Patient       Patient will benefit from skilled therapeutic intervention in order to improve the following deficits and impairments:  Abnormal gait, Decreased activity tolerance, Decreased balance, Decreased endurance, Decreased coordination, Decreased mobility, Decreased range of motion, Decreased safety awareness, Difficulty walking, Decreased strength, Hypomobility, Impaired perceived functional ability, Impaired UE functional use, Obesity, Impaired flexibility, Pain  Visit Diagnosis: Difficulty in walking, not elsewhere classified  Muscle weakness (generalized)  Pain in left hip     Problem List Patient  Active Problem List   Diagnosis Date Noted  . Eczema 06/27/2017  . Diabetic ulcer of left foot ([place]) 06/24/2017  . Mass of both adrenal glands ([place]) 05/16/2017  . Fatty liver 05/13/2017  . Pain of left hip joint 04/22/2017  . Abnormality of gait and mobility 04/22/2017  . Fall with injury 02/02/2017  . Anemia 11/18/2016  . Asthma 11/18/2016  . Carpal tunnel syndrome 11/18/2016  . Pain in joint involving ankle and foot 11/18/2016  . Obstructive sleep apnea of adult 11/18/2016  . Spinal stenosis of thoracic region 11/18/2016  . Abscess of tendon of left foot 09/21/2016  . Cubital tunnel syndrome on right 08/07/2016  . Chronic pain syndrome 02/25/2016  . Long term current use of opiate analgesic 02/25/2016  . Chronic right shoulder pain 02/25/2016  . Status post reverse total shoulder replacement 10/08/2015  . 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 Morbid obesity with BMI of 40.0-44.9, adult ([place]) 09/23/2015  . Chronic prescription benzodiazepine use 09/23/2015  . Osteoarthritis, multiple sites 09/23/2015  .  Other shoulder lesions (Right) 08/28/2015  . Cervical radiculitis 08/06/2015  . Opiate use (15 MME/Day) 01/16/2015  . Impingement syndrome of shoulder region 01/07/2015  . Mixed incontinence 11/26/2014  . Atrophic vaginitis 11/26/2014  . Anxiety and depression 09/21/2014  . Bladder cystocele 09/21/2014  . Gastro-esophageal reflux disease without esophagitis 09/21/2014  . DM (diabetes mellitus) type II, controlled, with peripheral vascular disorder ([place]) 08/31/2014  . Diabetic peripheral neuropathy associated with type 2 diabetes mellitus ([place]) 08/31/2014  . Asthma, moderate persistent, well-controlled 08/31/2014  . Hypothyroidism, adult 08/31/2014  . Peripheral vascular disease due to secondary diabetes mellitus ([place]) 12/11/2013  . Apnea, sleep 10/19/2013  . Paroxysmal atrial fibrillation ([place]); CHA2DS2-Vasc Score 5. On Pradaxa 07/27/2013  . Essential hypertension 07/27/2013    Alanson Puls, [place] DPT 07/13/2017, 3:40 PM  [place] MAIN [facility] SERVICES [address] [place], [place], [address] Phone: [phone]   Fax:  [phone]  Name: [name] MRN: [id number] Date of Birth: November 15, 1950

## 2017-07-14 DIAGNOSIS — L97521 Non-pressure chronic ulcer of other part of left foot limited to breakdown of skin: Secondary | ICD-10-CM | POA: Diagnosis not present

## 2017-07-15 ENCOUNTER — Ambulatory Visit: Payer: PPO | Admitting: Physical Therapy

## 2017-07-15 ENCOUNTER — Encounter: Payer: Self-pay | Admitting: Physical Therapy

## 2017-07-15 ENCOUNTER — Other Ambulatory Visit: Payer: Self-pay | Admitting: Family Medicine

## 2017-07-15 DIAGNOSIS — M6281 Muscle weakness (generalized): Secondary | ICD-10-CM

## 2017-07-15 DIAGNOSIS — R262 Difficulty in walking, not elsewhere classified: Secondary | ICD-10-CM | POA: Diagnosis not present

## 2017-07-15 DIAGNOSIS — M25552 Pain in left hip: Secondary | ICD-10-CM

## 2017-07-15 NOTE — Patient Instructions (Signed)
[place] [employer] a large can or rolled towel under leg. Straighten knee and leg. Hold __3__ seconds. Repeat with other leg. Repeat _15___ times. Do ___1_ sessions per day.  http://gt2.exer.us/366   Copyright  VHI. All rights reserved.  HIP: Abduction / External Rotation - Side-Lying    Lie on side with hip and knees bent. Raise top knee up, squeezing glutes. Keep ankles together. _15__ reps per set, _1__ sets per day, __7_ days per week   Copyright  VHI. All rights reserved.  Hip Abduction / Adduction: with Knee Flexion (Supine)    With right knee bent, gently lower knee to side and return. Repeat __15__ times per set. Do 1____ sets per session. Do __1__ sessions per day.  http://orth.exer.us/683   Copyright  VHI. All rights reserved.  Hip Abduction / Adduction: with Extended Knee (Supine)    Bring left leg out to side and return. Keep knee straight. Repeat __15__ times per set. Do _1___ sets per session. Do __1_ sessions per day.  http://orth.exer.us/681   Copyright  VHI. All rights reserved.  SUPINE: Bridging    Place feet on surface. Tighten glutes. Raise hips up. 15___ reps per set, ___1 sets per day, __7_ days per week   Copyright  VHI. All rights reserved.  Supine (Active)    Lie on back with one knee bent, feet in socks. straighten leg,and  sliding heel up and down    Repeat _15__ times per session. Do _1__ sessions per day.  Copyright  VHI. All rights reserved.

## 2017-07-15 NOTE — Therapy (Signed)
[place] MAIN [facility] SERVICES [address] [place], [place], [address] Phone: [phone]   Fax:  [phone]  Physical Therapy Treatment  Patient Details  Name: [name] MRN: [id number] Date of Birth: [birth year] Referring Provider: Dr. Roland Rack   Encounter Date: 07/15/2017  PT End of Session - 07/15/17 1440    Visit Number  12    Number of Visits  17    Date for PT Re-Evaluation  08/24/17    PT Start Time  0230    PT Stop Time  0310    PT Time Calculation (min)  40 min    Equipment Utilized During Treatment  Gait belt    Activity Tolerance  Patient tolerated treatment well;Patient limited by pain;Patient limited by fatigue    Behavior During Therapy  [facility] for tasks assessed/performed       Past Medical History:  Diagnosis Date  . Abnormal antibody titer   . Anxiety and depression   . Arthritis   . Asthma   . Cystocele   . Depression   . Diabetes ([place])   . DVT (deep venous thrombosis) (HCC)    Righ calf  . Dysrhythmia   . Edema   . GERD (gastroesophageal reflux disease)   . Heart murmur    Echocardiogram June 2015: Mild MR (possible vegetation seen on TTE, not seen on TEE), normal LV size with moderate concentric LVH. Normal function EF 60-65%. Normal diastolic function. Mild LA dilation.  Marland Kitchen History of IBS   . History of methicillin resistant staphylococcus aureus (MRSA) 2008  . Hyperlipidemia   . Hypertension   . Hypothyroidism   . Insomnia   . Kidney stone    kidney stones with lithotripsy  . Neuropathy involving both lower extremities   . Obesity, Class II, BMI 35-39.9, with comorbidity   . Paroxysmal atrial fibrillation ([place]) 2007   In June 2015, Cardiac Event Monitor: Mostly SR/sinus arrhythmia with PVCs that are frequent. Short bursts of A. fib lasting several minutes;; CHA2DS2-VASc Score = 5 (age, Female, PVD, DM, HTN)  . Peripheral vascular disease ([place])   . Sleep apnea    use C-PAP  . Urinary incontinence   .  Venous stasis dermatitis of both lower extremities     Past Surgical History:  Procedure Laterality Date  . ANKLE SURGERY Right   . APPLICATION OF WOUND VAC Left 06/27/2015   Procedure: APPLICATION OF WOUND VAC ( POSSIBLE ) ;  Surgeon: [name], MD;  Location: [facility] ORS;  Service: Vascular;  Laterality: Left;  . APPLICATION OF WOUND VAC Left 09/25/2016   Procedure: APPLICATION OF WOUND VAC;  Surgeon: [name], DPM;  Location: [facility] ORS;  Service: Podiatry;  Laterality: Left;  . arm surgery     right    . CHOLECYSTECTOMY    . COLONOSCOPY    . COLONOSCOPY WITH PROPOFOL N/A 01/11/2017   Procedure: COLONOSCOPY WITH PROPOFOL;  Surgeon: [name], MD;  Location: [facility] ENDOSCOPY;  Service: Endoscopy;  Laterality: N/A;  . COLONOSCOPY WITH PROPOFOL N/A 02/22/2017   Procedure: COLONOSCOPY WITH PROPOFOL;  Surgeon: [name], MD;  Location: [facility] ENDOSCOPY;  Service: Endoscopy;  Laterality: N/A;  . COLONOSCOPY WITH PROPOFOL N/A 05/31/2017   Procedure: COLONOSCOPY WITH PROPOFOL;  Surgeon: [name], MD;  Location: [facility] ENDOSCOPY;  Service: Endoscopy;  Laterality: N/A;  . ESOPHAGOGASTRODUODENOSCOPY (EGD) WITH PROPOFOL N/A 01/11/2017   Procedure: ESOPHAGOGASTRODUODENOSCOPY (EGD) WITH PROPOFOL;  Surgeon: [name], MD;  Location: [facility]  ENDOSCOPY;  Service: Endoscopy;  Laterality: N/A;  . HERNIA REPAIR     umbilical  . HIATAL HERNIA REPAIR    . I&D EXTREMITY Left 06/27/2015   Procedure: IRRIGATION AND DEBRIDEMENT EXTREMITY            ( CALF HEMATOMA ) POSSIBLE WOUND VAC;  Surgeon: [name], MD;  Location: [facility] ORS;  Service: Vascular;  Laterality: Left;  . INCISION AND DRAINAGE OF WOUND Left 09/25/2016   Procedure: IRRIGATION AND DEBRIDEMENT - PARTIAL RESECTION OF ACHILLES TENDON WITH WOUND VAC APPLICATION;  Surgeon: [name], DPM;  Location: [facility] ORS;  Service: Podiatry;  Laterality: Left;  . IRRIGATION AND DEBRIDEMENT ABSCESS Left 09/07/2016   Procedure:  IRRIGATION AND DEBRIDEMENT ABSCESS LEFT HEEL;  Surgeon: [name], DPM;  Location: [facility] ORS;  Service: Podiatry;  Laterality: Left;  . JOINT REPLACEMENT  2013   left knee replacement  . KIDNEY SURGERY Right    kidney stones  . LITHOTRIPSY    . NM [facility] HX)  February 2017   Likely breast attenuation. LOW RISK study. Normal EF 55-60%.  . OTHER SURGICAL HISTORY     08/2016 or 09/2016 surgery on achilles tenso h/o staph infection heal. Dr. [name]  . removal of left hematoma Left    leg  . REVERSE SHOULDER ARTHROPLASTY Right 10/08/2015   Procedure: REVERSE SHOULDER ARTHROPLASTY;  Surgeon: [name], MD;  Location: [facility] ORS;  Service: Orthopedics;  Laterality: Right;  . SLEEVE GASTROPLASTY    . TONSILLECTOMY    . TOTAL KNEE ARTHROPLASTY    . TOTAL SHOULDER REPLACEMENT Right   . TRANSESOPHAGEAL ECHOCARDIOGRAM  08/08/2013   Mild LVH, EF 60-65%. Moderate LA dilation and mild RA dilation. Mild MR with no evidence of stenosis and no evidence of endocarditis. A false chordae is noted.  . TRANSTHORACIC ECHOCARDIOGRAM  08/03/2013   Mild-Moderate concentric LVH, EF 60-65%. Normal diastolic function. Mild LA dilation. Mild MR with possible vegetation  - not confirmed on TEE   . ULNAR NERVE TRANSPOSITION Right 08/06/2016   Procedure: ULNAR NERVE DECOMPRESSION/TRANSPOSITION;  Surgeon: [name], MD;  Location: [facility] ORS;  Service: Orthopedics;  Laterality: Right;  . UPPER GI ENDOSCOPY    . VAGINAL HYSTERECTOMY      There were no vitals filed for this visit.  Subjective Assessment - 07/15/17 1438    Subjective  Patient is having cellulitis in BLE and it looks red and very broken out. She has an IT trainer on her left leg today.     Patient is accompained by:  Family member    Pertinent History  Patient presents with hx of DM, PVD, peripheral neuropathy. In June 2018 patient suffered staph infection and subsequent necrosis of L Achilles tendon requiring partial resection. Patient  has suffered three falls in last six months, including one where she felt a "pop" in the hip while getting out of the bathtub. Went to [facility] two days later and received x-ray, negative; diagnosed with muscle strain and was recommended exercises. ER visit on 1/28 following third fall onto L hip, diagnosed with hematoma. Patient reports reduced sensation in feet L > R. Requires assistance to get into and out of bed since tendon surgery. Patient uses spc for ambulation and reports that small steps do not cause pain.    Limitations  Walking;Sitting;Standing;Lifting;House hold activities    How long can you sit comfortably?  1 hr    How long can you stand comfortably?  n/a  How long can you walk comfortably?  40 min    Diagnostic tests  x-ray, negative, CT scan showed torn muscle    Patient Stated Goals  Improve balance, reduce fall risk, and can go somewhere without FOF    Currently in Pain?  Yes    Pain Score  4     Pain Location  Buttocks    Pain Orientation  Right    Pain Descriptors / Indicators  Aching    Pain Type  Chronic pain    Pain Onset  1 to 4 weeks ago    Aggravating Factors   sitting    Pain Relieving Factors  rest    Effect of Pain on Daily Activities  unable to walk as far    Pain Onset  1 to 4 weeks ago       Therex: Seated LAQ: RLE 2.5lbs 2x10;cues to perform slowly for maximum strengthening 0lbs LAQ 2x10, cues to complete full ROM Seated hamstring curl yellow band 2x10 BLE, cues to pull into full ROM Supine march in hooklying with TA,  2x10 with cues for increased hip flexion 2x10 Supine bridge with cues to stabilize pelvis 2x10 Supine SLR 4x5 BLE  Supine hip abd/add with cues to keep L leg in neutral and not to ER sidelying clamshell 3x5 BLE, cue to perform slowly sidelying hip abduction with assist 3x5 BLE Sit to stand from elevated mat table 3x5 with cues for hip abduction  Encouraged to keep upright posture with ambulation and VC to eccentrically control leg with SLR  , VC throughout with demonstration to keep LE aligned with body to maintain hip in neutral during abduction Patient received a written HEP today.                        PT Education - 07/15/17 1439    Education provided  Yes    Education Details  exercise technique    Person(s) Educated  Patient    Methods  Explanation;Demonstration    Comprehension  Verbalized understanding;Returned demonstration       PT Short Term Goals - 07/06/17 1519      PT SHORT TERM GOAL #1   Title  Patient will be independent in home exercise program to improve strength/mobility for better functional independence with bed mobility and ADLs.    Baseline  No HEP    Time  4    Period  Weeks    Status  On-going    Target Date  07/06/17      PT SHORT TERM GOAL #2   Title  Patient will report a worst pain score of 3/10 VAS in the L hip to improve tolerance with ADLs and reduce symptoms with bed mobility activities.      Baseline  6/10 with medication; 07/06/17 4/10 right hip buttocks    Time  4    Period  Weeks    Status  Partially Met    Target Date  07/06/17      PT SHORT TERM GOAL #3   Title  Patient will demonstrate sit to supine bed mobility, supine to sit bed mobility, and rolling L and R with minimal assistance for improved functional mobility and independence    Baseline  Patient requires mod-max A for bed mobility; independent with more time    Time  4    Period  Weeks    Status  Achieved    Target Date  07/06/17  PT Long Term Goals - 07/06/17 1513      PT LONG TERM GOAL #1   Title  Patient (>13 years old) will improve 10-meter walk test to 0.6 meters/sec with least restrictive AD to demonstrate reduced fall risk and capacity for limited community ambulation.    Baseline  0.21 meters/sec, 05/25/17 = .21 m/sec, . 40 m/sec 06/29/17; 07/06/17;  .40 m/sec    Time  8    Period  Weeks    Status  Partially Met    Target Date  08/24/17      PT LONG TERM GOAL #2   Title   Patient will improve LEFS score to 40/80 to demonstrate improved functional mobility and independence with ADLs    Baseline  9/80; 05/25/17= 12/80, 20/80 06/29/17; 07/06/17:29/80     Time  8    Period  Weeks    Status  On-going    Target Date  08/24/17      PT LONG TERM GOAL #3   Title  Patient will demonstrate ability to perform bed mobility independently for improved functional mobility and independence.    Baseline  Patient is independent with bed mobitiy     Time  8    Period  Weeks    Status  Achieved    Target Date  06/21/17      PT LONG TERM GOAL #4   Title  Patient will increase six minute walk test distance to >1000 for progression to community ambulator and improve gait ability    Baseline  225 ft; 07/06/17    Time  8    Period  Weeks    Status  On-going    Target Date  08/24/17            Plan - 07/15/17 1440    Clinical Impression Statement  Patient demonstrates decreased gait speed with deviations and requires verbal and tactile cueing to maintain center of gravity during ambulation.  Patient also requires CGA during all dynamic standing balance activities. Patient requires consistent cueing to maintain correct position during gait activities. Patient demonstrates difficulty with gait and increased postural sway while on rollator.  Patient will continue to benefit from skilled therapy in order to improve dynamic standing balance and increase endurance    Rehab Potential  Fair    Clinical Impairments Affecting Rehab Potential  - comorbidities, age, severity / + family support, acute injury    PT Frequency  2x / week    PT Duration  8 weeks    PT Treatment/Interventions  Aquatic Therapy;ADLs/Self Care Home Management;Cryotherapy;Electrical Stimulation;Moist Heat;Ultrasound;DME Instruction;Gait training;Stair training;Functional mobility training;Therapeutic activities;Therapeutic exercise;Balance training;Neuromuscular re-education;Patient/family education;Manual  techniques;Compression bandaging;Scar mobilization;Passive range of motion;Dry needling;Energy conservation;Taping    PT Next Visit Plan  LE strengthening    Consulted and Agree with Plan of Care  Patient       Patient will benefit from skilled therapeutic intervention in order to improve the following deficits and impairments:  Abnormal gait, Decreased activity tolerance, Decreased balance, Decreased endurance, Decreased coordination, Decreased mobility, Decreased range of motion, Decreased safety awareness, Difficulty walking, Decreased strength, Hypomobility, Impaired perceived functional ability, Impaired UE functional use, Obesity, Impaired flexibility, Pain  Visit Diagnosis: Difficulty in walking, not elsewhere classified  Muscle weakness (generalized)  Pain in left hip     Problem List Patient Active Problem List   Diagnosis Date Noted  . Eczema 06/27/2017  . Diabetic ulcer of left foot ([place]) 06/24/2017  . Mass of both adrenal glands ([place]) 05/16/2017  .  Fatty liver 05/13/2017  . Pain of left hip joint 04/22/2017  . Abnormality of gait and mobility 04/22/2017  . Fall with injury 02/02/2017  . Anemia 11/18/2016  . Asthma 11/18/2016  . Carpal tunnel syndrome 11/18/2016  . Pain in joint involving ankle and foot 11/18/2016  . Obstructive sleep apnea of adult 11/18/2016  . Spinal stenosis of thoracic region 11/18/2016  . Abscess of tendon of left foot 09/21/2016  . Cubital tunnel syndrome on right 08/07/2016  . Chronic pain syndrome 02/25/2016  . Long term current use of opiate analgesic 02/25/2016  . Chronic right shoulder pain 02/25/2016  . Status post reverse total shoulder replacement 10/08/2015  . 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 Morbid obesity with BMI of 40.0-44.9, adult ([place]) 09/23/2015  . Chronic prescription benzodiazepine use 09/23/2015  . Osteoarthritis, multiple sites 09/23/2015  . Other shoulder lesions (Right) 08/28/2015  .  Cervical radiculitis 08/06/2015  . Opiate use (15 MME/Day) 01/16/2015  . Impingement syndrome of shoulder region 01/07/2015  . Mixed incontinence 11/26/2014  . Atrophic vaginitis 11/26/2014  . Anxiety and depression 09/21/2014  . Bladder cystocele 09/21/2014  . Gastro-esophageal reflux disease without esophagitis 09/21/2014  . DM (diabetes mellitus) type II, controlled, with peripheral vascular disorder ([place]) 08/31/2014  . Diabetic peripheral neuropathy associated with type 2 diabetes mellitus ([place]) 08/31/2014  . Asthma, moderate persistent, well-controlled 08/31/2014  . Hypothyroidism, adult 08/31/2014  . Peripheral vascular disease due to secondary diabetes mellitus ([employer]) 12/11/2013  . Apnea, sleep 10/19/2013  . Paroxysmal atrial fibrillation ([place]); CHA2DS2-Vasc Score 5. On Pradaxa 07/27/2013  . Essential hypertension 07/27/2013    Alanson Puls, [place] DPT 07/15/2017, 2:58 PM  [place] MAIN [facility] SERVICES [address] [place], [place], [address] Phone: [phone]   Fax:  [phone]  Name: [name] MRN: [id number] Date of Birth: September 30, 1950

## 2017-07-16 ENCOUNTER — Other Ambulatory Visit: Payer: Self-pay | Admitting: Internal Medicine

## 2017-07-16 DIAGNOSIS — I10 Essential (primary) hypertension: Secondary | ICD-10-CM

## 2017-07-16 MED ORDER — QUINAPRIL HCL 40 MG PO TABS: 40.0000 mg | ORAL_TABLET | Freq: Every day | ORAL | 1 refills | Status: DC

## 2017-07-16 MED ORDER — BUPROPION HCL ER (XL) 300 MG PO TB24: 300.0000 mg | ORAL_TABLET | Freq: Every day | ORAL | 11 refills | Status: DC

## 2017-07-20 ENCOUNTER — Ambulatory Visit: Payer: PPO | Admitting: Physical Therapy

## 2017-07-21 DIAGNOSIS — E1143 Type 2 diabetes mellitus with diabetic autonomic (poly)neuropathy: Secondary | ICD-10-CM | POA: Diagnosis not present

## 2017-07-21 DIAGNOSIS — L97521 Non-pressure chronic ulcer of other part of left foot limited to breakdown of skin: Secondary | ICD-10-CM | POA: Diagnosis not present

## 2017-07-22 ENCOUNTER — Ambulatory Visit: Payer: PPO | Attending: Surgery | Admitting: Physical Therapy

## 2017-07-22 ENCOUNTER — Encounter: Payer: Self-pay | Admitting: Physical Therapy

## 2017-07-22 DIAGNOSIS — R262 Difficulty in walking, not elsewhere classified: Secondary | ICD-10-CM | POA: Diagnosis not present

## 2017-07-22 DIAGNOSIS — M25552 Pain in left hip: Secondary | ICD-10-CM | POA: Diagnosis not present

## 2017-07-22 DIAGNOSIS — M6281 Muscle weakness (generalized): Secondary | ICD-10-CM | POA: Insufficient documentation

## 2017-07-22 NOTE — Therapy (Signed)
[place] MAIN [facility] SERVICES [address] [place], [place], [address] Phone: [phone]   Fax:  [phone]  Physical Therapy Treatment  Patient Details  Name: [name] MRN: [id number] Date of Birth: 23-Sep-1950 Referring Provider: Dr. Roland Rack   Encounter Date: 07/22/2017  PT End of Session - 07/22/17 1558    Visit Number  13    Number of Visits  17    Date for PT Re-Evaluation  08/24/17    PT Start Time  0315    PT Stop Time  0400    PT Time Calculation (min)  45 min    Equipment Utilized During Treatment  Gait belt    Activity Tolerance  Patient tolerated treatment well;Patient limited by pain;Patient limited by fatigue    Behavior During Therapy  [facility] for tasks assessed/performed       Past Medical History:  Diagnosis Date  . Abnormal antibody titer   . Anxiety and depression   . Arthritis   . Asthma   . Cystocele   . Depression   . Diabetes ([place])   . DVT (deep venous thrombosis) (HCC)    Righ calf  . Dysrhythmia   . Edema   . GERD (gastroesophageal reflux disease)   . Heart murmur    Echocardiogram June 2015: Mild MR (possible vegetation seen on TTE, not seen on TEE), normal LV size with moderate concentric LVH. Normal function EF 60-65%. Normal diastolic function. Mild LA dilation.  Marland Kitchen History of IBS   . History of methicillin resistant staphylococcus aureus (MRSA) 2008  . Hyperlipidemia   . Hypertension   . Hypothyroidism   . Insomnia   . Kidney stone    kidney stones with lithotripsy  . Neuropathy involving both lower extremities   . Obesity, Class II, BMI 35-39.9, with comorbidity   . Paroxysmal atrial fibrillation ([place]) 2007   In June 2015, Cardiac Event Monitor: Mostly SR/sinus arrhythmia with PVCs that are frequent. Short bursts of A. fib lasting several minutes;; CHA2DS2-VASc Score = 5 (age, Female, PVD, DM, HTN)  . Peripheral vascular disease ([place])   . Sleep apnea    use C-PAP  . Urinary incontinence   .  Venous stasis dermatitis of both lower extremities     Past Surgical History:  Procedure Laterality Date  . ANKLE SURGERY Right   . APPLICATION OF WOUND VAC Left 06/27/2015   Procedure: APPLICATION OF WOUND VAC ( POSSIBLE ) ;  Surgeon: [name], MD;  Location: [facility] ORS;  Service: Vascular;  Laterality: Left;  . APPLICATION OF WOUND VAC Left 09/25/2016   Procedure: APPLICATION OF WOUND VAC;  Surgeon: [name], DPM;  Location: [facility] ORS;  Service: Podiatry;  Laterality: Left;  . arm surgery     right    . CHOLECYSTECTOMY    . COLONOSCOPY    . COLONOSCOPY WITH PROPOFOL N/A 01/11/2017   Procedure: COLONOSCOPY WITH PROPOFOL;  Surgeon: [name], MD;  Location: [facility] ENDOSCOPY;  Service: Endoscopy;  Laterality: N/A;  . COLONOSCOPY WITH PROPOFOL N/A 02/22/2017   Procedure: COLONOSCOPY WITH PROPOFOL;  Surgeon: [name], MD;  Location: [facility] ENDOSCOPY;  Service: Endoscopy;  Laterality: N/A;  . COLONOSCOPY WITH PROPOFOL N/A 05/31/2017   Procedure: COLONOSCOPY WITH PROPOFOL;  Surgeon: [name], MD;  Location: [facility] ENDOSCOPY;  Service: Endoscopy;  Laterality: N/A;  . ESOPHAGOGASTRODUODENOSCOPY (EGD) WITH PROPOFOL N/A 01/11/2017   Procedure: ESOPHAGOGASTRODUODENOSCOPY (EGD) WITH PROPOFOL;  Surgeon: [name], MD;  Location: [facility]  ENDOSCOPY;  Service: Endoscopy;  Laterality: N/A;  . HERNIA REPAIR     umbilical  . HIATAL HERNIA REPAIR    . I&D EXTREMITY Left 06/27/2015   Procedure: IRRIGATION AND DEBRIDEMENT EXTREMITY            ( CALF HEMATOMA ) POSSIBLE WOUND VAC;  Surgeon: [name], MD;  Location: [facility] ORS;  Service: Vascular;  Laterality: Left;  . INCISION AND DRAINAGE OF WOUND Left 09/25/2016   Procedure: IRRIGATION AND DEBRIDEMENT - PARTIAL RESECTION OF ACHILLES TENDON WITH WOUND VAC APPLICATION;  Surgeon: [name], DPM;  Location: [facility] ORS;  Service: Podiatry;  Laterality: Left;  . IRRIGATION AND DEBRIDEMENT ABSCESS Left 09/07/2016   Procedure:  IRRIGATION AND DEBRIDEMENT ABSCESS LEFT HEEL;  Surgeon: [name], DPM;  Location: [facility] ORS;  Service: Podiatry;  Laterality: Left;  . JOINT REPLACEMENT  2013   left knee replacement  . KIDNEY SURGERY Right    kidney stones  . LITHOTRIPSY    . NM [facility] HX)  February 2017   Likely breast attenuation. LOW RISK study. Normal EF 55-60%.  . OTHER SURGICAL HISTORY     08/2016 or 09/2016 surgery on achilles tenso h/o staph infection heal. Dr. [name]  . removal of left hematoma Left    leg  . REVERSE SHOULDER ARTHROPLASTY Right 10/08/2015   Procedure: REVERSE SHOULDER ARTHROPLASTY;  Surgeon: [name], MD;  Location: [facility] ORS;  Service: Orthopedics;  Laterality: Right;  . SLEEVE GASTROPLASTY    . TONSILLECTOMY    . TOTAL KNEE ARTHROPLASTY    . TOTAL SHOULDER REPLACEMENT Right   . TRANSESOPHAGEAL ECHOCARDIOGRAM  08/08/2013   Mild LVH, EF 60-65%. Moderate LA dilation and mild RA dilation. Mild MR with no evidence of stenosis and no evidence of endocarditis. A false chordae is noted.  . TRANSTHORACIC ECHOCARDIOGRAM  08/03/2013   Mild-Moderate concentric LVH, EF 60-65%. Normal diastolic function. Mild LA dilation. Mild MR with possible vegetation  - not confirmed on TEE   . ULNAR NERVE TRANSPOSITION Right 08/06/2016   Procedure: ULNAR NERVE DECOMPRESSION/TRANSPOSITION;  Surgeon: [name], MD;  Location: [facility] ORS;  Service: Orthopedics;  Laterality: Right;  . UPPER GI ENDOSCOPY    . VAGINAL HYSTERECTOMY      There were no vitals filed for this visit.  Subjective Assessment - 07/22/17 1554    Subjective  Patient is walking better and is using her rollator on level surfaces and a cane on the steps. She still needs help getting into the bed.     Patient is accompained by:  Family member    Pertinent History  Patient presents with hx of DM, PVD, peripheral neuropathy. In June 2018 patient suffered staph infection and subsequent necrosis of L Achilles tendon requiring partial  resection. Patient has suffered three falls in last six months, including one where she felt a "pop" in the hip while getting out of the bathtub. Went to [facility] two days later and received x-ray, negative; diagnosed with muscle strain and was recommended exercises. ER visit on 1/28 following third fall onto L hip, diagnosed with hematoma. Patient reports reduced sensation in feet L > R. Requires assistance to get into and out of bed since tendon surgery. Patient uses spc for ambulation and reports that small steps do not cause pain.    Limitations  Walking;Sitting;Standing;Lifting;House hold activities    How long can you sit comfortably?  1 hr    How long can you stand comfortably?  n/a  How long can you walk comfortably?  40 min    Diagnostic tests  x-ray, negative, CT scan showed torn muscle    Patient Stated Goals  Improve balance, reduce fall risk, and can go somewhere without FOF    Currently in Pain?  Yes    Pain Score  8     Pain Location  Buttocks    Pain Orientation  Lower    Pain Descriptors / Indicators  Aching    Pain Type  Acute pain    Pain Onset  1 to 4 weeks ago    Aggravating Factors   sit to stand and stepping up    Pain Relieving Factors  rest    Effect of Pain on Daily Activities  she does the activity anyway    Pain Onset  1 to 4 weeks ago         Therex: Seated LAQ: RLE 2.5lbs 2x10;cues to perform slowly for maximum strengthening 0lbs LAQ 2x10, cues to complete full ROM Seated hamstring curl yellow band 2x10 BLE, cues to pull into full ROM Supine marchin hooklying with TA,2x10 with cues for increased hip flexion 2x10 Supine bridge with cues to stabilize pelvis 2x10 Supine SLR 4x5 BLE Supine hip abd/add with cues to keep L leg in neutral and not to ER sidelying clamshell 3x5 BLE, cue to perform slowly sidelying hip abduction with assist 3x5 BLE Sit to stand from elevated mat table 3x5 with cues for hip abduction  Patient ambulates with rollator 150 feet with  CGA and better upright posture.                     PT Education - 07/22/17 1558    Education provided  Yes    Education Details  HEP    Person(s) Educated  Patient    Methods  Explanation;Demonstration    Comprehension  Verbalized understanding;Returned demonstration       PT Short Term Goals - 07/06/17 1519      PT SHORT TERM GOAL #1   Title  Patient will be independent in home exercise program to improve strength/mobility for better functional independence with bed mobility and ADLs.    Baseline  No HEP    Time  4    Period  Weeks    Status  On-going    Target Date  07/06/17      PT SHORT TERM GOAL #2   Title  Patient will report a worst pain score of 3/10 VAS in the L hip to improve tolerance with ADLs and reduce symptoms with bed mobility activities.      Baseline  6/10 with medication; 07/06/17 4/10 right hip buttocks    Time  4    Period  Weeks    Status  Partially Met    Target Date  07/06/17      PT SHORT TERM GOAL #3   Title  Patient will demonstrate sit to supine bed mobility, supine to sit bed mobility, and rolling L and R with minimal assistance for improved functional mobility and independence    Baseline  Patient requires mod-max A for bed mobility; independent with more time    Time  4    Period  Weeks    Status  Achieved    Target Date  07/06/17        PT Long Term Goals - 07/06/17 1513      PT LONG TERM GOAL #1   Title  Patient (>60 years  old) will improve 10-meter walk test to 0.6 meters/sec with least restrictive AD to demonstrate reduced fall risk and capacity for limited community ambulation.    Baseline  0.21 meters/sec, 05/25/17 = .21 m/sec, . 40 m/sec 06/29/17; 07/06/17;  .40 m/sec    Time  8    Period  Weeks    Status  Partially Met    Target Date  08/24/17      PT LONG TERM GOAL #2   Title  Patient will improve LEFS score to 40/80 to demonstrate improved functional mobility and independence with ADLs    Baseline  9/80;  05/25/17= 12/80, 20/80 06/29/17; 07/06/17:29/80     Time  8    Period  Weeks    Status  On-going    Target Date  08/24/17      PT LONG TERM GOAL #3   Title  Patient will demonstrate ability to perform bed mobility independently for improved functional mobility and independence.    Baseline  Patient is independent with bed mobitiy     Time  8    Period  Weeks    Status  Achieved    Target Date  06/21/17      PT LONG TERM GOAL #4   Title  Patient will increase six minute walk test distance to >1000 for progression to community ambulator and improve gait ability    Baseline  225 ft; 07/06/17    Time  8    Period  Weeks    Status  On-going    Target Date  08/24/17            Plan - 07/22/17 1559    Clinical Impression Statement  Instructed patient in advanced LE strengthening and balance exercise; Patient requires min Vcs for correct exercise technique including to slow down LE movement and increase ROM for better strengthening; Patient demonstrates better sit to stand transfer and bed mobiltiy.   Patient continues to have trouble with reaching outside of base of support .Patient would benefit from additional skilled PT Intervention to improve LE strength, balance and gait safety and return to PLOF.    Rehab Potential  Fair    Clinical Impairments Affecting Rehab Potential  - comorbidities, age, severity / + family support, acute injury    PT Frequency  2x / week    PT Duration  8 weeks    PT Treatment/Interventions  Aquatic Therapy;ADLs/Self Care Home Management;Cryotherapy;Electrical Stimulation;Moist Heat;Ultrasound;DME Instruction;Gait training;Stair training;Functional mobility training;Therapeutic activities;Therapeutic exercise;Balance training;Neuromuscular re-education;Patient/family education;Manual techniques;Compression bandaging;Scar mobilization;Passive range of motion;Dry needling;Energy conservation;Taping    PT Next Visit Plan  LE strengthening    Consulted and Agree with  Plan of Care  Patient       Patient will benefit from skilled therapeutic intervention in order to improve the following deficits and impairments:  Abnormal gait, Decreased activity tolerance, Decreased balance, Decreased endurance, Decreased coordination, Decreased mobility, Decreased range of motion, Decreased safety awareness, Difficulty walking, Decreased strength, Hypomobility, Impaired perceived functional ability, Impaired UE functional use, Obesity, Impaired flexibility, Pain  Visit Diagnosis: Difficulty in walking, not elsewhere classified  Muscle weakness (generalized)  Pain in left hip     Problem List Patient Active Problem List   Diagnosis Date Noted  . Eczema 06/27/2017  . Diabetic ulcer of left foot ([place]) 06/24/2017  . Mass of both adrenal glands ([place]) 05/16/2017  . Fatty liver 05/13/2017  . Pain of left hip joint 04/22/2017  . Abnormality of gait and mobility 04/22/2017  . Fall  with injury 02/02/2017  . Anemia 11/18/2016  . Asthma 11/18/2016  . Carpal tunnel syndrome 11/18/2016  . Pain in joint involving ankle and foot 11/18/2016  . Obstructive sleep apnea of adult 11/18/2016  . Spinal stenosis of thoracic region 11/18/2016  . Abscess of tendon of left foot 09/21/2016  . Cubital tunnel syndrome on right 08/07/2016  . Chronic pain syndrome 02/25/2016  . Long term current use of opiate analgesic 02/25/2016  . Chronic right shoulder pain 02/25/2016  . Status post reverse total shoulder replacement 10/08/2015  . 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 Morbid obesity with BMI of 40.0-44.9, adult ([place]) 09/23/2015  . Chronic prescription benzodiazepine use 09/23/2015  . Osteoarthritis, multiple sites 09/23/2015  . Other shoulder lesions (Right) 08/28/2015  . Cervical radiculitis 08/06/2015  . Opiate use (15 MME/Day) 01/16/2015  . Impingement syndrome of shoulder region 01/07/2015  . Mixed incontinence 11/26/2014  . Atrophic vaginitis  11/26/2014  . Anxiety and depression 09/21/2014  . Bladder cystocele 09/21/2014  . Gastro-esophageal reflux disease without esophagitis 09/21/2014  . DM (diabetes mellitus) type II, controlled, with peripheral vascular disorder ([place]) 08/31/2014  . Diabetic peripheral neuropathy associated with type 2 diabetes mellitus ([place]) 08/31/2014  . Asthma, moderate persistent, well-controlled 08/31/2014  . Hypothyroidism, adult 08/31/2014  . Peripheral vascular disease due to secondary diabetes mellitus ([place]) 12/11/2013  . Apnea, sleep 10/19/2013  . Paroxysmal atrial fibrillation ([place]); CHA2DS2-Vasc Score 5. On Pradaxa 07/27/2013  . Essential hypertension 07/27/2013    Arelia Sneddon S,PT DPT 07/22/2017, 4:43 PM  [place] MAIN [facility] SERVICES [address] [place], [place], [address] Phone: [phone]   Fax:  [phone]  Name: [name] MRN: [id number] Date of Birth: 05-19-1950

## 2017-07-27 ENCOUNTER — Ambulatory Visit: Payer: PPO | Admitting: Physical Therapy

## 2017-07-28 DIAGNOSIS — L97521 Non-pressure chronic ulcer of other part of left foot limited to breakdown of skin: Secondary | ICD-10-CM | POA: Diagnosis not present

## 2017-07-28 DIAGNOSIS — E1143 Type 2 diabetes mellitus with diabetic autonomic (poly)neuropathy: Secondary | ICD-10-CM | POA: Diagnosis not present

## 2017-07-29 ENCOUNTER — Encounter: Payer: Self-pay | Admitting: Physical Therapy

## 2017-07-29 ENCOUNTER — Ambulatory Visit: Payer: PPO | Admitting: Physical Therapy

## 2017-07-29 DIAGNOSIS — R262 Difficulty in walking, not elsewhere classified: Secondary | ICD-10-CM

## 2017-07-29 DIAGNOSIS — M25552 Pain in left hip: Secondary | ICD-10-CM

## 2017-07-29 DIAGNOSIS — M6281 Muscle weakness (generalized): Secondary | ICD-10-CM

## 2017-07-29 NOTE — Therapy (Signed)
[place] MAIN [facility] SERVICES [address] [place], [place], [address] Phone: [phone]   Fax:  [phone]  Physical Therapy Treatment  Patient Details  Name: [name] MRN: [id number] Date of Birth: September 18, 1950 Referring Provider: Dr. Roland Rack   Encounter Date: 07/29/2017  PT End of Session - 07/29/17 1601    Visit Number  14    Number of Visits  17    Date for PT Re-Evaluation  08/24/17    PT Start Time  0354    PT Stop Time  0432    PT Time Calculation (min)  38 min    Equipment Utilized During Treatment  Gait belt    Activity Tolerance  Patient tolerated treatment well;Patient limited by pain;Patient limited by fatigue    Behavior During Therapy  [facility] for tasks assessed/performed       Past Medical History:  Diagnosis Date  . Abnormal antibody titer   . Anxiety and depression   . Arthritis   . Asthma   . Cystocele   . Depression   . Diabetes ([place])   . DVT (deep venous thrombosis) (HCC)    Righ calf  . Dysrhythmia   . Edema   . GERD (gastroesophageal reflux disease)   . Heart murmur    Echocardiogram June 2015: Mild MR (possible vegetation seen on TTE, not seen on TEE), normal LV size with moderate concentric LVH. Normal function EF 60-65%. Normal diastolic function. Mild LA dilation.  Marland Kitchen History of IBS   . History of methicillin resistant staphylococcus aureus (MRSA) 2008  . Hyperlipidemia   . Hypertension   . Hypothyroidism   . Insomnia   . Kidney stone    kidney stones with lithotripsy  . Neuropathy involving both lower extremities   . Obesity, Class II, BMI 35-39.9, with comorbidity   . Paroxysmal atrial fibrillation ([place]) 2007   In June 2015, Cardiac Event Monitor: Mostly SR/sinus arrhythmia with PVCs that are frequent. Short bursts of A. fib lasting several minutes;; CHA2DS2-VASc Score = 5 (age, Female, PVD, DM, HTN)  . Peripheral vascular disease ([place])   . Sleep apnea    use C-PAP  . Urinary incontinence   .  Venous stasis dermatitis of both lower extremities     Past Surgical History:  Procedure Laterality Date  . ANKLE SURGERY Right   . APPLICATION OF WOUND VAC Left 06/27/2015   Procedure: APPLICATION OF WOUND VAC ( POSSIBLE ) ;  Surgeon: [name], MD;  Location: [facility] ORS;  Service: Vascular;  Laterality: Left;  . APPLICATION OF WOUND VAC Left 09/25/2016   Procedure: APPLICATION OF WOUND VAC;  Surgeon: [name], DPM;  Location: [facility] ORS;  Service: Podiatry;  Laterality: Left;  . arm surgery     right    . CHOLECYSTECTOMY    . COLONOSCOPY    . COLONOSCOPY WITH PROPOFOL N/A 01/11/2017   Procedure: COLONOSCOPY WITH PROPOFOL;  Surgeon: [name], MD;  Location: [facility] ENDOSCOPY;  Service: Endoscopy;  Laterality: N/A;  . COLONOSCOPY WITH PROPOFOL N/A 02/22/2017   Procedure: COLONOSCOPY WITH PROPOFOL;  Surgeon: [name], MD;  Location: [facility] ENDOSCOPY;  Service: Endoscopy;  Laterality: N/A;  . COLONOSCOPY WITH PROPOFOL N/A 05/31/2017   Procedure: COLONOSCOPY WITH PROPOFOL;  Surgeon: [name], MD;  Location: [facility] ENDOSCOPY;  Service: Endoscopy;  Laterality: N/A;  . ESOPHAGOGASTRODUODENOSCOPY (EGD) WITH PROPOFOL N/A 01/11/2017   Procedure: ESOPHAGOGASTRODUODENOSCOPY (EGD) WITH PROPOFOL;  Surgeon: [name], MD;  Location: [facility]  ENDOSCOPY;  Service: Endoscopy;  Laterality: N/A;  . HERNIA REPAIR     umbilical  . HIATAL HERNIA REPAIR    . I&D EXTREMITY Left 06/27/2015   Procedure: IRRIGATION AND DEBRIDEMENT EXTREMITY            ( CALF HEMATOMA ) POSSIBLE WOUND VAC;  Surgeon: [name], MD;  Location: [facility] ORS;  Service: Vascular;  Laterality: Left;  . INCISION AND DRAINAGE OF WOUND Left 09/25/2016   Procedure: IRRIGATION AND DEBRIDEMENT - PARTIAL RESECTION OF ACHILLES TENDON WITH WOUND VAC APPLICATION;  Surgeon: [name], DPM;  Location: [facility] ORS;  Service: Podiatry;  Laterality: Left;  . IRRIGATION AND DEBRIDEMENT ABSCESS Left 09/07/2016   Procedure:  IRRIGATION AND DEBRIDEMENT ABSCESS LEFT HEEL;  Surgeon: [name], DPM;  Location: [facility] ORS;  Service: Podiatry;  Laterality: Left;  . JOINT REPLACEMENT  2013   left knee replacement  . KIDNEY SURGERY Right    kidney stones  . LITHOTRIPSY    . NM [facility] HX)  February 2017   Likely breast attenuation. LOW RISK study. Normal EF 55-60%.  . OTHER SURGICAL HISTORY     08/2016 or 09/2016 surgery on achilles tenso h/o staph infection heal. Dr. [name]  . removal of left hematoma Left    leg  . REVERSE SHOULDER ARTHROPLASTY Right 10/08/2015   Procedure: REVERSE SHOULDER ARTHROPLASTY;  Surgeon: [name], MD;  Location: [facility] ORS;  Service: Orthopedics;  Laterality: Right;  . SLEEVE GASTROPLASTY    . TONSILLECTOMY    . TOTAL KNEE ARTHROPLASTY    . TOTAL SHOULDER REPLACEMENT Right   . TRANSESOPHAGEAL ECHOCARDIOGRAM  08/08/2013   Mild LVH, EF 60-65%. Moderate LA dilation and mild RA dilation. Mild MR with no evidence of stenosis and no evidence of endocarditis. A false chordae is noted.  . TRANSTHORACIC ECHOCARDIOGRAM  08/03/2013   Mild-Moderate concentric LVH, EF 60-65%. Normal diastolic function. Mild LA dilation. Mild MR with possible vegetation  - not confirmed on TEE   . ULNAR NERVE TRANSPOSITION Right 08/06/2016   Procedure: ULNAR NERVE DECOMPRESSION/TRANSPOSITION;  Surgeon: [name], MD;  Location: [facility] ORS;  Service: Orthopedics;  Laterality: Right;  . UPPER GI ENDOSCOPY    . VAGINAL HYSTERECTOMY      There were no vitals filed for this visit.  Subjective Assessment - 07/29/17 1600    Subjective  Patient is having a difficult day due to having her [place].  He got her una boot off and her left ankle area near achilles tendon is red and aggravated and raised.   Patient is accompained by:  Family member    Pertinent History  Patient presents with hx of DM, PVD, peripheral neuropathy. In June 2018 patient suffered staph infection and subsequent necrosis of L  Achilles tendon requiring partial resection. Patient has suffered three falls in last six months, including one where she felt a "pop" in the hip while getting out of the bathtub. Went to [facility] two days later and received x-ray, negative; diagnosed with muscle strain and was recommended exercises. ER visit on 1/28 following third fall onto L hip, diagnosed with hematoma. Patient reports reduced sensation in feet L > R. Requires assistance to get into and out of bed since tendon surgery. Patient uses spc for ambulation and reports that small steps do not cause pain.    Limitations  Walking;Sitting;Standing;Lifting;House hold activities    How long can you sit comfortably?  1 hr    How long can  you stand comfortably?  n/a    How long can you walk comfortably?  40 min    Diagnostic tests  x-ray, negative, CT scan showed torn muscle    Patient Stated Goals  Improve balance, reduce fall risk, and can go somewhere without FOF    Currently in Pain?  No/denies    Pain Score  0-No pain    Pain Onset  1 to 4 weeks ago    Pain Onset  1 to 4 weeks ago       Therex: Seated LAQ: RLE 2.5lbs 2x10;cues to perform slowly for maximum strengthening 0lbs LAQ 2x10, cues to complete full ROM Seated hamstring curl yellow band 2x10 BLE, cues to pull into full ROM Supine marchin hooklying with TA,2x10 with cues for increased hip flexion 2x10 Supine bridge with cues to stabilize pelvis 2x10 Supine SLR 4x5 BLE Supine hip abd/add with cues to keep L leg in neutral and not to ER sidelying clamshell 3x5 BLE, cue to perform slowly sidelying hip abduction with assist 3x5 BLE Sit to stand from elevated mat table 3x5 with cues for hip abduction Encouraged to keep upright posture withambulation andVC to eccentrically control legwith SLR,VC throughout with demonstration to keep LE aligned with body to maintain hip in neutral during abduction Patient received a written HEP  today.                        PT Education - 07/29/17 1600    Education provided  Yes    Education Details  HEP    Person(s) Educated  Patient    Methods  Explanation    Comprehension  Verbalized understanding       PT Short Term Goals - 07/06/17 1519      PT SHORT TERM GOAL #1   Title  Patient will be independent in home exercise program to improve strength/mobility for better functional independence with bed mobility and ADLs.    Baseline  No HEP    Time  4    Period  Weeks    Status  On-going    Target Date  07/06/17      PT SHORT TERM GOAL #2   Title  Patient will report a worst pain score of 3/10 VAS in the L hip to improve tolerance with ADLs and reduce symptoms with bed mobility activities.      Baseline  6/10 with medication; 07/06/17 4/10 right hip buttocks    Time  4    Period  Weeks    Status  Partially Met    Target Date  07/06/17      PT SHORT TERM GOAL #3   Title  Patient will demonstrate sit to supine bed mobility, supine to sit bed mobility, and rolling L and R with minimal assistance for improved functional mobility and independence    Baseline  Patient requires mod-max A for bed mobility; independent with more time    Time  4    Period  Weeks    Status  Achieved    Target Date  07/06/17        PT Long Term Goals - 07/06/17 1513      PT LONG TERM GOAL #1   Title  Patient (>78 years old) will improve 10-meter walk test to 0.6 meters/sec with least restrictive AD to demonstrate reduced fall risk and capacity for limited community ambulation.    Baseline  0.21 meters/sec, 05/25/17 = .21 m/sec, . 40 m/sec  06/29/17; 07/06/17;  .40 m/sec    Time  8    Period  Weeks    Status  Partially Met    Target Date  08/24/17      PT LONG TERM GOAL #2   Title  Patient will improve LEFS score to 40/80 to demonstrate improved functional mobility and independence with ADLs    Baseline  9/80; 05/25/17= 12/80, 20/80 06/29/17; 07/06/17:29/80     Time  8     Period  Weeks    Status  On-going    Target Date  08/24/17      PT LONG TERM GOAL #3   Title  Patient will demonstrate ability to perform bed mobility independently for improved functional mobility and independence.    Baseline  Patient is independent with bed mobitiy     Time  8    Period  Weeks    Status  Achieved    Target Date  06/21/17      PT LONG TERM GOAL #4   Title  Patient will increase six minute walk test distance to >1000 for progression to community ambulator and improve gait ability    Baseline  225 ft; 07/06/17    Time  8    Period  Weeks    Status  On-going    Target Date  08/24/17            Plan - 07/29/17 1602    Clinical Impression Statement  Patient was assessed for LE strength today and has -2/5 hip extension and add strength bilaterally, 3+/5 hip abduction bilaterally. Patient instructed in LE strengthening including closed chain and open chain exercises for hip strengthening with cues for posture and correct positioning of LE during side lying abduction.  She is able to perform all exercise's without pain behaviors. She will benefit from skilled PT to improve long distance walking and return to her prior level and decrease her falls risk.    Rehab Potential  Fair    Clinical Impairments Affecting Rehab Potential  - comorbidities, age, severity / + family support, acute injury    PT Frequency  2x / week    PT Duration  8 weeks    PT Treatment/Interventions  Aquatic Therapy;ADLs/Self Care Home Management;Cryotherapy;Electrical Stimulation;Moist Heat;Ultrasound;DME Instruction;Gait training;Stair training;Functional mobility training;Therapeutic activities;Therapeutic exercise;Balance training;Neuromuscular re-education;Patient/family education;Manual techniques;Compression bandaging;Scar mobilization;Passive range of motion;Dry needling;Energy conservation;Taping    PT Next Visit Plan  LE strengthening    Consulted and Agree with Plan of Care  Patient        Patient will benefit from skilled therapeutic intervention in order to improve the following deficits and impairments:  Abnormal gait, Decreased activity tolerance, Decreased balance, Decreased endurance, Decreased coordination, Decreased mobility, Decreased range of motion, Decreased safety awareness, Difficulty walking, Decreased strength, Hypomobility, Impaired perceived functional ability, Impaired UE functional use, Obesity, Impaired flexibility, Pain  Visit Diagnosis: Difficulty in walking, not elsewhere classified  Muscle weakness (generalized)  Pain in left hip     Problem List Patient Active Problem List   Diagnosis Date Noted  . Eczema 06/27/2017  . Diabetic ulcer of left foot ([place]) 06/24/2017  . Mass of both adrenal glands ([place]) 05/16/2017  . Fatty liver 05/13/2017  . Pain of left hip joint 04/22/2017  . Abnormality of gait and mobility 04/22/2017  . Fall with injury 02/02/2017  . Anemia 11/18/2016  . Asthma 11/18/2016  . Carpal tunnel syndrome 11/18/2016  . Pain in joint involving ankle and foot 11/18/2016  . Obstructive  sleep apnea of adult 11/18/2016  . Spinal stenosis of thoracic region 11/18/2016  . Abscess of tendon of left foot 09/21/2016  . Cubital tunnel syndrome on right 08/07/2016  . Chronic pain syndrome 02/25/2016  . Long term current use of opiate analgesic 02/25/2016  . Chronic right shoulder pain 02/25/2016  . Status post reverse total shoulder replacement 10/08/2015  . 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 Morbid obesity with BMI of 40.0-44.9, adult ([place]) 09/23/2015  . Chronic prescription benzodiazepine use 09/23/2015  . Osteoarthritis, multiple sites 09/23/2015  . Other shoulder lesions (Right) 08/28/2015  . Cervical radiculitis 08/06/2015  . Opiate use (15 MME/Day) 01/16/2015  . Impingement syndrome of shoulder region 01/07/2015  . Mixed incontinence 11/26/2014  . Atrophic vaginitis 11/26/2014  . Anxiety and  depression 09/21/2014  . Bladder cystocele 09/21/2014  . Gastro-esophageal reflux disease without esophagitis 09/21/2014  . DM (diabetes mellitus) type II, controlled, with peripheral vascular disorder ([place]) 08/31/2014  . Diabetic peripheral neuropathy associated with type 2 diabetes mellitus ([place]) 08/31/2014  . Asthma, moderate persistent, well-controlled 08/31/2014  . Hypothyroidism, adult 08/31/2014  . Peripheral vascular disease due to secondary diabetes mellitus ([place]) 12/11/2013  . Apnea, sleep 10/19/2013  . Paroxysmal atrial fibrillation ([place]); CHA2DS2-Vasc Score 5. On Pradaxa 07/27/2013  . Essential hypertension 07/27/2013    Alanson Puls, [place] DPT 07/29/2017, 4:11 PM  [place] MAIN [facility] SERVICES [address] [place], [place], [address] Phone: [phone]   Fax:  [phone]  Name: [name] MRN: [id number] Date of Birth: 1950/05/30

## 2017-07-30 ENCOUNTER — Telehealth: Payer: Self-pay | Admitting: Internal Medicine

## 2017-07-30 NOTE — Telephone Encounter (Signed)
Copied from [place] [phone]. Topic: Quick Communication - See Telephone Encounter >> Jul 30, 2017  2:26 PM [name] wrote: CRM for notification. See Telephone encounter for: 07/30/17.  Pt. Is asking if Dr. Aundra Dubin could call in a prescription for UTI for her.  She says she has had this before and she is almost positive that it is UTI. She does not want to go all weekend with this and seeing if could call in today.  [facility] - [place], [place] - [address] [place] [address] Phone: [phone] Fax: [phone]

## 2017-07-30 NOTE — Telephone Encounter (Signed)
Please advise 

## 2017-07-30 NOTE — Telephone Encounter (Signed)
neeeds appt for urine culture   [place]

## 2017-07-30 NOTE — Telephone Encounter (Signed)
Patient coming in Monday for appointment.  She is going to go buy azo to see if it helps.

## 2017-08-02 ENCOUNTER — Ambulatory Visit: Payer: PPO | Admitting: Physical Therapy

## 2017-08-02 ENCOUNTER — Ambulatory Visit (INDEPENDENT_AMBULATORY_CARE_PROVIDER_SITE_OTHER): Payer: PPO | Admitting: Internal Medicine

## 2017-08-02 ENCOUNTER — Encounter: Payer: Self-pay | Admitting: Internal Medicine

## 2017-08-02 VITALS — BP 136/58 | HR 73 | Temp 97.8°F | Ht 65.0 in | Wt 263.8 lb

## 2017-08-02 DIAGNOSIS — I482 Chronic atrial fibrillation, unspecified: Secondary | ICD-10-CM

## 2017-08-02 DIAGNOSIS — N3001 Acute cystitis with hematuria: Secondary | ICD-10-CM | POA: Diagnosis not present

## 2017-08-02 DIAGNOSIS — I10 Essential (primary) hypertension: Secondary | ICD-10-CM

## 2017-08-02 DIAGNOSIS — E279 Disorder of adrenal gland, unspecified: Secondary | ICD-10-CM | POA: Diagnosis not present

## 2017-08-02 DIAGNOSIS — E278 Other specified disorders of adrenal gland: Secondary | ICD-10-CM

## 2017-08-02 DIAGNOSIS — R300 Dysuria: Secondary | ICD-10-CM

## 2017-08-02 LAB — URINALYSIS, ROUTINE W REFLEX MICROSCOPIC
Bilirubin Urine: NEGATIVE
Nitrite: NEGATIVE
PH: 5.5 (ref 5.0–8.0)
Specific Gravity, Urine: >=1.03 — AB (ref 1.000–1.030)
Urine Glucose: NEGATIVE
Urobilinogen, UA: 0.2 (ref 0.0–1.0)

## 2017-08-02 MED ORDER — METOPROLOL SUCCINATE ER 100 MG PO TB24: 100.0000 mg | ORAL_TABLET | Freq: Every day | ORAL | 1 refills | Status: DC

## 2017-08-02 MED ORDER — LEVOFLOXACIN 250 MG PO TABS: 250.0000 mg | ORAL_TABLET | Freq: Every day | ORAL | 0 refills | Status: DC

## 2017-08-02 NOTE — Progress Notes (Signed)
Pre visit review using our clinic review tool, if applicable. No additional management support is needed unless otherwise documented below in the visit note. 

## 2017-08-02 NOTE — Progress Notes (Signed)
Chief Complaint  Patient presents with  . Urinary Tract Infection   F/u  1. Dysuria x 2 weeks h/o UTI appt with urology 09/02/17 nothing tried  2. Wounds to left leg still healing will message Dr. [name] to see if needs plastics/ortho/wound involved in healing as of 08/2017 1 year since pt had wounds. Pt had unna boots recently and sees Dr. [name] q weds  3. H/o myelolipoma b/l adrenal glands noted since 2014 will repeat adrenal imaging to f/u dis with radiology 4. HTN intermittently uncontrolled on quinapril 40 mg qd, lopressor 25 bid, norvasc 5 mg qd see #3   Review of Systems  Constitutional: Negative for weight loss.  HENT: Negative for hearing loss.   Eyes: Negative for blurred vision.  Respiratory: Negative for shortness of breath.   Cardiovascular: Negative for chest pain.  Genitourinary: Positive for dysuria.  Musculoskeletal: Positive for joint pain. Negative for falls.  Skin: Negative for rash.       +wounds  Neurological: Negative for headaches.  Psychiatric/Behavioral: Negative for depression.   Past Medical History:  Diagnosis Date  . Abnormal antibody titer   . Anxiety and depression   . Arthritis   . Asthma   . Cystocele   . Depression   . Diabetes ([place])   . DVT (deep venous thrombosis) (HCC)    Righ calf  . Dysrhythmia   . Edema   . GERD (gastroesophageal reflux disease)   . Heart murmur    Echocardiogram June 2015: Mild MR (possible vegetation seen on TTE, not seen on TEE), normal LV size with moderate concentric LVH. Normal function EF 60-65%. Normal diastolic function. Mild LA dilation.  Marland Kitchen History of IBS   . History of methicillin resistant staphylococcus aureus (MRSA) 2008  . Hyperlipidemia   . Hypertension   . Hypothyroidism   . Insomnia   . Kidney stone    kidney stones with lithotripsy  . Neuropathy involving both lower extremities   . Obesity, Class II, BMI 35-39.9, with comorbidity   . Paroxysmal atrial fibrillation ([place]) 2007   In June 2015,  Cardiac Event Monitor: Mostly SR/sinus arrhythmia with PVCs that are frequent. Short bursts of A. fib lasting several minutes;; CHA2DS2-VASc Score = 5 (age, Female, PVD, DM, HTN)  . Peripheral vascular disease ([place])   . Sleep apnea    use C-PAP  . Urinary incontinence   . Venous stasis dermatitis of both lower extremities    Past Surgical History:  Procedure Laterality Date  . ANKLE SURGERY Right   . APPLICATION OF WOUND VAC Left 06/27/2015   Procedure: APPLICATION OF WOUND VAC ( POSSIBLE ) ;  Surgeon: [name], MD;  Location: [facility] ORS;  Service: Vascular;  Laterality: Left;  . APPLICATION OF WOUND VAC Left 09/25/2016   Procedure: APPLICATION OF WOUND VAC;  Surgeon: [name], DPM;  Location: [facility];  Service: Podiatry;  Laterality: Left;  . arm surgery     right    . CHOLECYSTECTOMY    . COLONOSCOPY    . COLONOSCOPY WITH PROPOFOL N/A 01/11/2017   Procedure: COLONOSCOPY WITH PROPOFOL;  Surgeon: [name], MD;  Location: [facility] ENDOSCOPY;  Service: Endoscopy;  Laterality: N/A;  . COLONOSCOPY WITH PROPOFOL N/A 02/22/2017   Procedure: COLONOSCOPY WITH PROPOFOL;  Surgeon: [name], MD;  Location: [facility] ENDOSCOPY;  Service: Endoscopy;  Laterality: N/A;  . COLONOSCOPY WITH PROPOFOL N/A 05/31/2017   Procedure: COLONOSCOPY WITH PROPOFOL;  Surgeon: [name], MD;  Location: [facility] ENDOSCOPY;  Service: Endoscopy;  Laterality: N/A;  . ESOPHAGOGASTRODUODENOSCOPY (EGD) WITH PROPOFOL N/A 01/11/2017   Procedure: ESOPHAGOGASTRODUODENOSCOPY (EGD) WITH PROPOFOL;  Surgeon: [name], MD;  Location: [facility] ENDOSCOPY;  Service: Endoscopy;  Laterality: N/A;  . HERNIA REPAIR     umbilical  . HIATAL HERNIA REPAIR    . I&D EXTREMITY Left 06/27/2015   Procedure: IRRIGATION AND DEBRIDEMENT EXTREMITY            ( CALF HEMATOMA ) POSSIBLE WOUND VAC;  Surgeon: [name], MD;  Location: [facility] ORS;  Service: Vascular;  Laterality: Left;  . INCISION AND DRAINAGE OF WOUND Left  09/25/2016   Procedure: IRRIGATION AND DEBRIDEMENT - PARTIAL RESECTION OF ACHILLES TENDON WITH WOUND VAC APPLICATION;  Surgeon: [name], DPM;  Location: [facility] OR[facility];  Service: Podiatry;  Laterality: Left;  . IRRIGATION AND DEBRIDEMENT ABSCESS Left 09/07/2016   Procedure: IRRIGATION AND DEBRIDEMENT ABSCESS LEFT HEEL;  Surgeon: [name], DPM;  Location: [facility];  Service: Podiatry;  Laterality: Left;  . JOINT REPLACEMENT  2013   left knee replacement  . KIDNEY SURGERY Right    kidney stones  . LITHOTRIPSY    . NM [facility] HX)  February 2017   Likely breast attenuation. LOW RISK study. Normal EF 55-60%.  . OTHER SURGICAL HISTORY     08/2016 or 09/2016 surgery on achilles tenso h/o staph infection heal. Dr. [name]  . removal of left hematoma Left    leg  . REVERSE SHOULDER ARTHROPLASTY Right 10/08/2015   Procedure: REVERSE SHOULDER ARTHROPLASTY;  Surgeon: [name], MD;  Location: [facility] ORS;  Service: Orthopedics;  Laterality: Right;  . SLEEVE GASTROPLASTY    . TONSILLECTOMY    . TOTAL KNEE ARTHROPLASTY    . TOTAL SHOULDER REPLACEMENT Right   . TRANSESOPHAGEAL ECHOCARDIOGRAM  08/08/2013   Mild LVH, EF 60-65%. Moderate LA dilation and mild RA dilation. Mild MR with no evidence of stenosis and no evidence of endocarditis. A false chordae is noted.  . TRANSTHORACIC ECHOCARDIOGRAM  08/03/2013   Mild-Moderate concentric LVH, EF 60-65%. Normal diastolic function. Mild LA dilation. Mild MR with possible vegetation  - not confirmed on TEE   . ULNAR NERVE TRANSPOSITION Right 08/06/2016   Procedure: ULNAR NERVE DECOMPRESSION/TRANSPOSITION;  Surgeon: [name], MD;  Location: [facility] ORS;  Service: Orthopedics;  Laterality: Right;  . UPPER GI ENDOSCOPY    . VAGINAL HYSTERECTOMY     Family History  Problem Relation Age of Onset  . Skin cancer Father   . Diabetes Father   . Hypertension Father   . Peripheral vascular disease Father   . Cancer Father   . Cerebral aneurysm  Father   . Alcohol abuse Father   . Varicose Veins Mother   . Kidney disease Mother   . Arthritis Mother   . Mental illness Sister   . Cancer Maternal Aunt        breast  . Breast cancer Maternal Aunt   . Arthritis Maternal Grandmother   . Hypertension Maternal Grandmother   . Diabetes Maternal Grandmother   . Arthritis Maternal Grandfather   . Heart disease Maternal Grandfather   . Hypertension Maternal Grandfather   . Arthritis Paternal Grandmother   . Hypertension Paternal Grandmother   . Diabetes Paternal Grandmother   . Arthritis Paternal Grandfather   . Hypertension Paternal Grandfather   . Bladder Cancer Neg Hx   . Kidney cancer Neg Hx    Social History   Socioeconomic History  .  Marital status: Married    Spouse name: Not on file  . Number of children: Not on file  . Years of education: Not on file  . Highest education level: Not on file  Occupational History  . Not on file  Social Needs  . Financial resource strain: Not on file  . Food insecurity:    Worry: Not on file    Inability: Not on file  . Transportation needs:    Medical: Not on file    Non-medical: Not on file  Tobacco Use  . Smoking status: Never Smoker  . Smokeless tobacco: Never Used  Substance and Sexual Activity  . Alcohol use: No    Alcohol/week: 0.0 oz  . Drug use: No  . Sexual activity: Never  Lifestyle  . Physical activity:    Days per week: Not on file    Minutes per session: Not on file  . Stress: Not on file  Relationships  . Social connections:    Talks on phone: Not on file    Gets together: Not on file    Attends religious service: Not on file    Active member of club or organization: Not on file    Attends meetings of clubs or organizations: Not on file    Relationship status: Not on file  . Intimate partner violence:    Fear of current or ex partner: Not on file    Emotionally abused: Not on file    Physically abused: Not on file    Forced sexual activity: Not on  file  Other Topics Concern  . Not on file  Social History Narrative   She is currently married -- for 28 years. Does not work. Does not smoke or take alcohol. She never smoked. She exercises at least 3 days a week since before her gastric surgery.   Marital status reviewed in history of present illness.   She has children and grandchildren    Current Meds  Medication Sig  . albuterol (PROVENTIL HFA;VENTOLIN HFA) 108 (90 Base) MCG/ACT inhaler Inhale 1-2 puffs into the lungs every 4 (four) hours as needed for wheezing or shortness of breath. (Patient taking differently: Inhale 2 puffs into the lungs every 4 (four) hours as needed for wheezing or shortness of breath. )  . albuterol (PROVENTIL) (2.5 MG/3ML) 0.083% nebulizer solution Take 3 mLs (2.5 mg total) by nebulization every 6 (six) hours as needed for wheezing or shortness of breath.  . ALPRAZolam (XANAX) 0.5 MG tablet Take 1 tablet (0.5 mg total) by mouth 2 (two) times daily as needed for anxiety.  Marland Kitchen amLODipine (NORVASC) 5 MG tablet Take 1 tablet (5 mg total) by mouth daily.  Marland Kitchen buPROPion (WELLBUTRIN XL) 300 MG 24 hr tablet Take 1 tablet (300 mg total) by mouth daily.  Marland Kitchen CALCIUM ALGINATE EX Apply topically.  . clobetasol cream (TEMOVATE) 0.09 % Apply 1 application topically 2 (two) times daily. To arms  . cyanocobalamin 500 MCG tablet Take 500 mcg by mouth daily.  . dabigatran (PRADAXA) 150 MG CAPS capsule Take 1 capsule (150 mg total) by mouth 2 (two) times daily.  Marland Kitchen dexlansoprazole (DEXILANT) 60 MG capsule Take 1 capsule (60 mg total) by mouth daily. Minutes before meal (lunch)  . DULoxetine (CYMBALTA) 60 MG capsule Take 1 capsule (60 mg total) by mouth daily.  Marland Kitchen estradiol (ESTRACE) 0.1 MG/GM vaginal cream Place 1 Applicatorful vaginally 3 (three) times a week.  . Ferrous Gluconate 324 (37.5 Fe) MG TABS Take 1 tablet (  324 mg total) by mouth daily with lunch. With food  . ferrous sulfate 325 (65 FE) MG tablet Take 325 mg by mouth daily with  breakfast.  . fluconazole (DIFLUCAN) 150 MG tablet   . Fluticasone-Salmeterol (ADVAIR) 250-50 MCG/DOSE AEPB Inhale 1 puff into the lungs 2 (two) times daily.  Marland Kitchen gabapentin (NEURONTIN) 800 MG tablet Take 1 tablet (800 mg total) by mouth 4 (four) times daily.  Marland Kitchen HYDROcodone-acetaminophen (NORCO/VICODIN) 5-325 MG tablet Take 1 tablet by mouth every 8 (eight) hours as needed for severe pain.  Marland Kitchen levocetirizine (XYZAL) 5 MG tablet Take 1 tablet (5 mg total) by mouth every evening.  Marland Kitchen levothyroxine (SYNTHROID, LEVOTHROID) 175 MCG tablet Take 1 tablet (175 mcg total) by mouth daily before breakfast.  . meloxicam (MOBIC) 15 MG tablet Take 15 mg by mouth daily.   . metFORMIN (GLUCOPHAGE) 1000 MG tablet TAKE ONE TABLET BY MOUTH TWICE DAILY (Patient taking differently: TAKE ONE TABLET IN THE MORNING AND HALF TABLET AT NIGHT)  . mirabegron ER (MYRBETRIQ) 25 MG TB24 tablet Take 1 tablet (25 mg total) by mouth daily.  . montelukast (SINGULAIR) 10 MG tablet TAKE ONE TABLET BY MOUTH EVERY DAY  . Multiple Vitamin (MULTIVITAMIN WITH MINERALS) TABS tablet Take 1 tablet by mouth daily.  . ondansetron (ZOFRAN) 4 MG tablet Take 1 tablet (4 mg total) every 8 (eight) hours as needed by mouth for nausea or vomiting.  Marland Kitchen oxybutynin (DITROPAN XL) 15 MG 24 hr tablet Take 1 tablet (15 mg total) by mouth at bedtime.  . potassium chloride (K-DUR,KLOR-CON) 10 MEQ tablet Take 10 mEq by mouth 2 (two) times daily.  . quinapril (ACCUPRIL) 40 MG tablet Take 1 tablet (40 mg total) by mouth daily.  Marland Kitchen triamcinolone cream (KENALOG) 0.1 % Apply 1 application 2 (two) times daily as needed topically.  . [DISCONTINUED] metoprolol tartrate (LOPRESSOR) 25 MG tablet TAKE 1 TABLET BY MOUTH TWICE DAILY   Allergies  Allergen Reactions  . Penicillins Hives, Shortness Of Breath and Swelling    Facial swelling Has patient had a PCN reaction causing immediate rash, facial/tongue/throat swelling, SOB or lightheadedness with hypotension: Yes Has  patient had a PCN reaction causing severe rash involving mucus membranes or skin necrosis: Yes Has patient had a PCN reaction that required hospitalization No Has patient had a PCN reaction occurring within the last 10 years: No If all of the above answers are "NO", then may proceed with Cephalosporin use.   . Aspirin Hives  . Morphine And Related Other (See Comments)    Patient becomes very confused  . Terramycin [Oxytetracycline] Hives   Recent Results (from the past 2160 hour(s))  Hepatitis C antibody     Status: None   Collection Time: 05/10/17  9:25 AM  Result Value Ref Range   Hepatitis C Ab NON-REACTIVE NON-REACTI   SIGNAL TO CUT-OFF 0.04 <1.00    Comment: . HCV antibody was non-reactive. There is no laboratory  evidence of HCV infection. . In most cases, no further action is required. However, if recent HCV exposure is suspected, a test for HCV RNA (test code [id number]) is suggested. . For additional information please refer to http://education.[facility].com/faq/FAQ22v1 (This link is being provided for informational/ educational purposes only.) .   Hepatitis B surface antigen     Status: None   Collection Time: 05/10/17  9:25 AM  Result Value Ref Range   Hepatitis B Surface Ag NON-REACTIVE NON-REACTI  Hepatitis B surface antibody     Status: Abnormal  Collection Time: 05/10/17  9:25 AM  Result Value Ref Range   Hepatitis B-Post <5 (L) > OR = 10 mIU/mL    Comment: . Patient does not have immunity to hepatitis B virus. . For additional information, please refer to http://education.[facility].com/faq/FAQ105 (This link is being provided for informational/ educational purposes only).   B12     Status: None   Collection Time: 05/10/17  9:25 AM  Result Value Ref Range   Vitamin B-12 502 211 - 911 pg/mL  Vitamin D (25 hydroxy)     Status: None   Collection Time: 05/10/17  9:25 AM  Result Value Ref Range   VITD 32.15 30.00 - 100.00 ng/mL  Urine  Microalbumin w/creat. ratio     Status: Abnormal   Collection Time: 05/10/17  9:25 AM  Result Value Ref Range   Microalb, Ur 2.7 (H) 0.0 - 1.9 mg/dL   Creatinine,U 158.8 mg/dL   Microalb Creat Ratio 1.7 0.0 - 30.0 mg/g  TSH     Status: Abnormal   Collection Time: 05/10/17  9:25 AM  Result Value Ref Range   TSH 6.17 (H) 0.35 - 4.50 uIU/mL  Lipid panel     Status: None   Collection Time: 05/10/17  9:25 AM  Result Value Ref Range   Cholesterol 149 0 - 200 mg/dL    Comment: ATP III Classification       Desirable:  < 200 mg/dL               Borderline High:  200 - 239 mg/dL          High:  > = 240 mg/dL   Triglycerides 81.0 0.0 - 149.0 mg/dL    Comment: Normal:  <150 mg/dLBorderline High:  150 - 199 mg/dL   HDL 65.50 >39.00 mg/dL   VLDL 16.2 0.0 - 40.0 mg/dL   LDL Cholesterol 67 0 - 99 mg/dL   Total CHOL/HDL Ratio 2     Comment:                Men          Women1/2 Average Risk     3.4          3.3Average Risk          5.0          4.42X Average Risk          9.6          7.13X Average Risk          15.0          11.0                       NonHDL 83.24     Comment: NOTE:  Non-HDL goal should be 30 mg/dL higher than patient's LDL goal (i.e. LDL goal of < 70 mg/dL, would have non-HDL goal of < 100 mg/dL)  Hemoglobin A1c     Status: None   Collection Time: 05/10/17  9:25 AM  Result Value Ref Range   Hgb A1c MFr Bld 6.4 4.6 - 6.5 %    Comment: Glycemic Control Guidelines for People with Diabetes:Non Diabetic:  <6%Goal of Therapy: <7%Additional Action Suggested:  >8%   Urinalysis, Routine w reflex microscopic     Status: Abnormal   Collection Time: 05/10/17  9:25 AM  Result Value Ref Range   Color, Urine YELLOW Yellow;Lt. Yellow   APPearance Sl Cloudy (A) Clear   Specific Gravity,  Urine >=1.030 (A) 1.000 - 1.030   pH 5.5 5.0 - 8.0   Total Protein, Urine NEGATIVE Negative   Urine Glucose NEGATIVE Negative   Ketones, ur TRACE (A) Negative   Bilirubin Urine NEGATIVE Negative   Hgb urine  dipstick NEGATIVE Negative   Urobilinogen, UA 0.2 0.0 - 1.0   Leukocytes, UA SMALL (A) Negative   Nitrite NEGATIVE Negative   WBC, UA 11-20/hpf (A) 0-2/hpf   RBC / HPF none seen 0-2/hpf   Mucus, UA Presence of (A) None   Squamous Epithelial / LPF Few(5-10/hpf) (A) Rare(0-4/hpf)  CBC with Differential/Platelet     Status: Abnormal   Collection Time: 05/10/17  9:25 AM  Result Value Ref Range   WBC 5.6 4.0 - 10.5 K/uL   RBC 3.98 3.87 - 5.11 Mil/uL   Hemoglobin 10.9 (L) 12.0 - 15.0 g/dL   HCT 33.9 (L) 36.0 - 46.0 %   MCV 85.0 78.0 - 100.0 fl   MCHC 32.3 30.0 - 36.0 g/dL   RDW 15.8 (H) 11.5 - 15.5 %   Platelets 271.0 150.0 - 400.0 K/uL   Neutrophils Relative % 62.6 43.0 - 77.0 %   Lymphocytes Relative 19.4 12.0 - 46.0 %   Monocytes Relative 9.2 3.0 - 12.0 %   Eosinophils Relative 7.3 (H) 0.0 - 5.0 %   Basophils Relative 1.5 0.0 - 3.0 %   Neutro Abs 3.5 1.4 - 7.7 K/uL   Lymphs Abs 1.1 0.7 - 4.0 K/uL   Monocytes Absolute 0.5 0.1 - 1.0 K/uL   Eosinophils Absolute 0.4 0.0 - 0.7 K/uL   Basophils Absolute 0.1 0.0 - 0.1 K/uL  Comprehensive metabolic panel     Status: Abnormal   Collection Time: 05/10/17  9:25 AM  Result Value Ref Range   Sodium 141 135 - 145 mEq/L   Potassium 4.7 3.5 - 5.1 mEq/L   Chloride 107 96 - 112 mEq/L   CO2 27 19 - 32 mEq/L   Glucose, Bld 108 (H) 70 - 99 mg/dL   BUN 29 (H) 6 - 23 mg/dL   Creatinine, Ser 0.89 0.40 - 1.20 mg/dL   Total Bilirubin 0.4 0.2 - 1.2 mg/dL   Alkaline Phosphatase 64 39 - 117 U/L   AST 12 0 - 37 U/L   ALT 9 0 - 35 U/L   Total Protein 6.6 6.0 - 8.3 g/dL   Albumin 3.9 3.5 - 5.2 g/dL   Calcium 9.0 8.4 - 10.5 mg/dL   GFR 67.10 >60.00 mL/min  Glucose, capillary     Status: Abnormal   Collection Time: 05/31/17  8:13 AM  Result Value Ref Range   Glucose-Capillary 151 (H) 65 - 99 mg/dL  Surgical pathology     Status: None   Collection Time: 05/31/17  8:49 AM  Result Value Ref Range   SURGICAL PATHOLOGY      Surgical Pathology THIS IS  AN ADDENDUM REPORT  CASE: [id number] PATIENT: [name] Surgical Pathology Report Addendum   Reason for Addendum #1:  Additional special stains  SPECIMEN SUBMITTED: A. Colon polyps (multiple), rectosigmoid; cbx B. Colon polyp, descending; cbx C. Colon, random right; cbxs D. Colon, random left; cbxs E. Colon polyp, proximal sigmoid; cbx  CLINICAL HISTORY: None provided  PRE-OPERATIVE DIAGNOSIS: IDA, diarrhea  POST-OPERATIVE DIAGNOSIS: Colon polyps, diverticulosis     DIAGNOSIS: A. COLON POLYPS, RECTOSIGMOID; COLD BIOPSY: - HYPERPLASTIC POLYPS (3). - CONGO RED STAIN TO BE REPORTED IN AN ADDENDUM. - NEGATIVE FOR DYSPLASIA AND MALIGNANCY.  B. COLON POLYP,  DESCENDING; COLD BIOPSY: - COLONIC MUCOSA WITH FOCAL HYPERPLASTIC EPITHELIAL CHANGE. - MULTIPLE DEEPER SECTIONS EXAMINED. - NEGATIVE FOR DYSPLASIA AND MALIGNANCY.  C. COLON, RANDOM RIGHT; COLD BIOPSY: - COLONIC MUCOSA WITH FOCAL PROMINEN T LYMPHOID AGGREGATE. - NEGATIVE FOR MICROSCOPIC COLITIS, DYSPLASIA, AND MALIGNANCY.  D. COLON, RANDOM LEFT; COLD BIOPSY: - UNREMARKABLE COLONIC MUCOSA. - NEGATIVE FOR MICROSCOPIC COLITIS, DYSPLASIA, AND MALIGNANCY.  E. COLON POLYP, PROXIMAL SIGMOID; COLD BIOPSY: - TUBULAR ADENOMA. - NEGATIVE FOR HIGH GRADE DYSPLASIA AND MALIGNANCY.   GROSS DESCRIPTION:  A. Labeled: rectosigmoid colon polyp multiple  Tissue fragment(s): multiple  Size: aggregate, 0.8 x 0.3 x 0.1 cm  Description: in formalin, pink fragments  Entirely submitted in 1 cassette(s).   B. Labeled: descending colon polyp C BX  Tissue fragment(s): 1  Size: 0.5 cm  Description: in formalin, tan fragment  Entirely submitted in 1 cassette(s).  C. Labeled: random right colon C BX  Tissue fragment(s): 3  Size: 0.2-0.3 cm  Description: in formalin, tan fragment  Entirely submitted in 1 cassette(s).  D. Labeled: random left colon C BX  Tissue fragment(s): 3  Size: 0.3-0.4 cm  Descript ion:   in formalin, Tan fragments  Entirely submitted in 1 cassette(s).  E. Labeled: proximal sigmoid colon polyp C BX  Tissue fragment(s): 2  Size: 0.3 and 0.4 cm  Description:  in formalin, tan fragment  Entirely submitted in 1 cassette(s).     Final Diagnosis performed by Quay Burow, MD.  Electronically signed 06/02/2017 3:53:06PM   The electronic signature indicates that the named Attending Pathologist has evaluated the specimen  Technical component performed at [facility], [address], [place], [place] [address] Lab: [phone] Dir: Rush Farmer, MD, MMM  Professional component performed at [facility], [facility], [place], [place], [place] [address] Lab: [phone] Dir: Dellia Nims. Rubinas, MD   ADDENDUM: A Congo Red stain is negative for amyloid. Stain control worked appropriately.    Addendum #1 performed by Quay Burow, MD.  Electronically signed 06/04/2017 4:11:19PM    Technical component performed at [facility] , [address] [place], [place] [address] Lab: [phone] Dir: Rush Farmer, MD, MMM  Professional component performed at [facility], [facility], [place], [place], [place] [address] Lab: [phone] Dir: Dellia Nims. Rubinas, MD    POCT Urinalysis Dipstick (Automated)     Status: Abnormal   Collection Time: 06/08/17 12:49 PM  Result Value Ref Range   Color, UA amber    Clarity, UA cloudy    Glucose, UA neg    Bilirubin, UA +    Ketones, UA +    Spec Grav, UA >=1.030 (A) 1.010 - 1.025   Blood, UA large    pH, UA 6.0 5.0 - 8.0   Protein, UA ++    Urobilinogen, UA 0.2 0.2 or 1.0 E.U./dL   Nitrite, UA neg    Leukocytes, UA Large (3+) (A) Negative  Urine Culture     Status: Abnormal   Collection Time: 06/08/17 12:55 PM  Result Value Ref Range   MICRO NUMBER: [id number]    SPECIMEN QUALITY: ADEQUATE    Sample Source URINE    STATUS: FINAL    ISOLATE 1: ESBL Klebsiella pneumoniae (A)      Comment: Greater than 100,000 CFU/mL of Klebsiella pneumoniae (ESBL) ESBL RESULT:        The organism has been confirmed as an ESBL producer.      Susceptibility   Esbl klebsiella pneumoniae - URINE CULTURE, REFLEX  AMOX/CLAVULANIC >=32 Resistant     AMPICILLIN >=32 Resistant      Extended spectrum beta-lactamase (ESBL) producingorganisms demonstrate decreased activity withpenicillins, cephalosporins and aztreonam.    AMPICILLIN/SULBACTAM >=32 Resistant     CEFAZOLIN >=64 Resistant     Extended spectrum beta-lactamase (ESBL) producingorganisms demonstrate decreased activity withpenicillins, cephalosporins and aztreonam.For uncomplicated UTI caused by E. coli,K. pneumoniae or P. mirabilis: Cefazolin issusceptible if MIC <32 mcg/mL and predictssusceptible to the oral agents cefaclor, cefdinir,cefpodoxime, cefprozil, cefuroxime, cephalexinand loracarbef.    CEFEPIME  Resistant     CEFTRIAXONE >=64 Resistant     CIPROFLOXACIN 2 Intermediate     LEVOFLOXACIN 1 Sensitive     ERTAPENEM <=0.5 Sensitive     GENTAMICIN >=16 Resistant     IMIPENEM <=0.25 Sensitive     NITROFURANTOIN 128 Resistant     PIP/TAZO 16 Sensitive     TOBRAMYCIN >=16 Resistant     TRIMETH/SULFA >=320 Resistant      Extended spectrum beta-lactamase (ESBL) producingorganisms demonstrate decreased activity withpenicillins, cephalosporins and aztreonam.For uncomplicated UTI caused by E. coli,K. pneumoniae or P. mirabilis: Cefazolin issusceptible if MIC <32 mcg/mL and predictssusceptible to the oral agents cefaclor, cefdinir,cefpodoxime, cefprozil, cefuroxime, cephalexinand loracarbef.Legend:S = Susceptible  I = IntermediateR = Resistant  NS = Not susceptible = Not tested  NR = Not reportedNN = See antimicrobic comments  TSH     Status: None   Collection Time: 06/24/17  2:30 PM  Result Value Ref Range   TSH 3.70 0.35 - 4.50 uIU/mL  Basic Metabolic Panel (BMET)     Status: Abnormal   Collection Time: 06/24/17  2:30 PM  Result  Value Ref Range   Sodium 141 135 - 145 mEq/L   Potassium 4.5 3.5 - 5.1 mEq/L   Chloride 104 96 - 112 mEq/L   CO2 30 19 - 32 mEq/L   Glucose, Bld 114 (H) 70 - 99 mg/dL   BUN 20 6 - 23 mg/dL   Creatinine, Ser 0.82 0.40 - 1.20 mg/dL   Calcium 9.1 8.4 - 10.5 mg/dL   GFR 73.73 >60.00 mL/min   Objective  Body mass index is 43.9 kg/m. Wt Readings from Last 3 Encounters:  08/02/17 263 lb 12.8 oz (119.7 kg)  06/24/17 254 lb 6.4 oz (115.4 kg)  06/08/17 255 lb (115.7 kg)   Temp Readings from Last 3 Encounters:  08/02/17 97.8 F (36.6 C) (Oral)  06/24/17 98.2 F (36.8 C) (Oral)  06/08/17 97.8 F (36.6 C) (Oral)   BP Readings from Last 3 Encounters:  08/02/17 (!) 136/58  06/24/17 (!) 162/68  06/15/17 135/68   Pulse Readings from Last 3 Encounters:  08/02/17 73  06/24/17 68  06/15/17 59    Physical Exam  Constitutional: She is oriented to person, place, and time. Vital signs are normal. She appears well-developed and well-nourished. She is cooperative.  HENT:  Head: Normocephalic and atraumatic.  Left Ear: External ear normal.  Mouth/Throat: Oropharynx is clear and moist and mucous membranes are normal.  Cardiovascular: Normal rate, regular rhythm and normal heart sounds.  NSR today   Pulmonary/Chest: Effort normal and breath sounds normal.  Neurological: She is alert and oriented to person, place, and time.  Using rolling walker today   Skin: Skin is warm, dry and intact.  Psychiatric: She has a normal mood and affect. Her speech is normal and behavior is normal. Judgment and thought content normal. Cognition and memory are normal.  Nursing note and vitals reviewed.   Assessment   1. Dysuria  2. Wounds to left foot and leg x 1 year as of 08/2017 3. HTN with h/o adrenal myelolipomas b/l noted CT ab/pelvis 2014  4. HM Plan   1. UA and culture  levaquin 250 mg x 5 days  F/u urology 09/02/17  2. F/u Dr. [name] awaiting to hear his reply  3.  CT ab adrenals w/o  Change  metoprolol tar. 25 bid to toprol 100 XL Quinapril 40 mg qd  norvasc 5 mg qd  4.  Had flu, pna 23, prevnar  Consider Tdap and shingrix in future Given Rx twinrix in past to get at pharmacy h/o fatty liver  DEXA 10/17/14 normal  Ask about pap at f/uif ever had abnormal mammo neg 4/16/18referred for another pending pt to sch  Colonoscopy h/o polyps 02/22/17 prep not good needs to f/u and resch with Ollen Bowl appt 05/31/17 Had due in 5 years from 05/31/17 [facility] GI  Provider: Dr. Olivia Mackie [name]-Internal Medicine

## 2017-08-02 NOTE — Patient Instructions (Addendum)
Try AZO with pyridium over the counter for burning with UTI   Please give my note to Dr. [name] I am awaiting his reply  Stop metoprolol tartrate 25 mg 2x per day and try metoprolol succinate 100 mg XL daily only take 1x per day  We will do CT of your adrenals  I will send my note to Dr. Fletcher Anon     Urinary Tract Infection, Adult A urinary tract infection (UTI) is an infection of any part of the urinary tract. The urinary tract includes the:  Kidneys.  Ureters.  Bladder.  Urethra.  These organs make, store, and get rid of pee (urine) in the body. Follow these instructions at home:  Take over-the-counter and prescription medicines only as told by your doctor.  If you were prescribed an antibiotic medicine, take it as told by your doctor. Do not stop taking the antibiotic even if you start to feel better.  Avoid the following drinks: ? Alcohol. ? Caffeine. ? Tea. ? Carbonated drinks.  Drink enough fluid to keep your pee clear or pale yellow.  Keep all follow-up visits as told by your doctor. This is important.  Make sure to: ? Empty your bladder often and completely. Do not to hold pee for long periods of time. ? Empty your bladder before and after sex. ? Wipe from front to back after a bowel movement if you are female. Use each tissue one time when you wipe. Contact a doctor if:  You have back pain.  You have a fever.  You feel sick to your stomach (nauseous).  You throw up (vomit).  Your symptoms do not get better after 3 days.  Your symptoms go away and then come back. Get help right away if:  You have very bad back pain.  You have very bad lower belly (abdominal) pain.  You are throwing up and cannot keep down any medicines or water. This information is not intended to replace advice given to you by your health care provider. Make sure you discuss any questions you have with your health care provider. Document Released: 08/26/2007 Document Revised:  08/15/2015 Document Reviewed: 01/28/2015 Elsevier Interactive Patient Education  [employer].

## 2017-08-03 LAB — URINE CULTURE
MICRO NUMBER:: 90579984
SPECIMEN QUALITY: ADEQUATE

## 2017-08-04 ENCOUNTER — Telehealth: Payer: Self-pay | Admitting: Internal Medicine

## 2017-08-04 DIAGNOSIS — L97521 Non-pressure chronic ulcer of other part of left foot limited to breakdown of skin: Secondary | ICD-10-CM | POA: Diagnosis not present

## 2017-08-04 DIAGNOSIS — E1143 Type 2 diabetes mellitus with diabetic autonomic (poly)neuropathy: Secondary | ICD-10-CM | POA: Diagnosis not present

## 2017-08-04 NOTE — Telephone Encounter (Signed)
Copied from [place] [id number]. Topic: Quick Communication - See Telephone Encounter >> Aug 04, 2017  9:56 AM Cleaster Corin, NT wrote: CRM for notification. See Telephone encounter for: 08/04/17.  Pt. Calling to receive lab results

## 2017-08-04 NOTE — Telephone Encounter (Signed)
Returned patients call and notified her of lab results patient verbalized understanding.

## 2017-08-05 ENCOUNTER — Ambulatory Visit: Payer: PPO | Admitting: Physical Therapy

## 2017-08-05 ENCOUNTER — Encounter: Payer: Self-pay | Admitting: Physical Therapy

## 2017-08-05 DIAGNOSIS — M6281 Muscle weakness (generalized): Secondary | ICD-10-CM

## 2017-08-05 DIAGNOSIS — R262 Difficulty in walking, not elsewhere classified: Secondary | ICD-10-CM | POA: Diagnosis not present

## 2017-08-05 DIAGNOSIS — M25552 Pain in left hip: Secondary | ICD-10-CM

## 2017-08-05 NOTE — Therapy (Signed)
[place] MAIN [facility] SERVICES [address] [place], [place], [address] Phone: [phone]   Fax:  [phone]  Physical Therapy Treatment Physical Therapy Progress Note   Dates of reporting period 07/06/17   to  08/05/17  Patient Details  Name: [name] MRN: [id number] Date of Birth: [birth year] Referring Provider: Dr. Roland Rack   Encounter Date: 08/05/2017  PT End of Session - 08/05/17 1701    Visit Number  15    Number of Visits  17    Date for PT Re-Evaluation  08/24/17    PT Start Time  0345    PT Stop Time  0430    PT Time Calculation (min)  45 min    Equipment Utilized During Treatment  Gait belt    Activity Tolerance  Patient tolerated treatment well;Patient limited by pain;Patient limited by fatigue    Behavior During Therapy  [facility] for tasks assessed/performed       Past Medical History:  Diagnosis Date  . Abnormal antibody titer   . Anxiety and depression   . Arthritis   . Asthma   . Cystocele   . Depression   . Diabetes ([place])   . DVT (deep venous thrombosis) (HCC)    Righ calf  . Dysrhythmia   . Edema   . GERD (gastroesophageal reflux disease)   . Heart murmur    Echocardiogram June 2015: Mild MR (possible vegetation seen on TTE, not seen on TEE), normal LV size with moderate concentric LVH. Normal function EF 60-65%. Normal diastolic function. Mild LA dilation.  Marland Kitchen History of IBS   . History of methicillin resistant staphylococcus aureus (MRSA) 2008  . Hyperlipidemia   . Hypertension   . Hypothyroidism   . Insomnia   . Kidney stone    kidney stones with lithotripsy  . Neuropathy involving both lower extremities   . Obesity, Class II, BMI 35-39.9, with comorbidity   . Paroxysmal atrial fibrillation ([place]) 2007   In June 2015, Cardiac Event Monitor: Mostly SR/sinus arrhythmia with PVCs that are frequent. Short bursts of A. fib lasting several minutes;; CHA2DS2-VASc Score = 5 (age, Female, PVD, DM, HTN)  . Peripheral  vascular disease ([place])   . Sleep apnea    use C-PAP  . Urinary incontinence   . Venous stasis dermatitis of both lower extremities     Past Surgical History:  Procedure Laterality Date  . ANKLE SURGERY Right   . APPLICATION OF WOUND VAC Left 06/27/2015   Procedure: APPLICATION OF WOUND VAC ( POSSIBLE ) ;  Surgeon: [name], MD;  Location: [facility] ORS;  Service: Vascular;  Laterality: Left;  . APPLICATION OF WOUND VAC Left 09/25/2016   Procedure: APPLICATION OF WOUND VAC;  Surgeon: [name], DPM;  Location: [facility] ORS;  Service: Podiatry;  Laterality: Left;  . arm surgery     right    . CHOLECYSTECTOMY    . COLONOSCOPY    . COLONOSCOPY WITH PROPOFOL N/A 01/11/2017   Procedure: COLONOSCOPY WITH PROPOFOL;  Surgeon: [name], MD;  Location: [facility] ENDOSCOPY;  Service: Endoscopy;  Laterality: N/A;  . COLONOSCOPY WITH PROPOFOL N/A 02/22/2017   Procedure: COLONOSCOPY WITH PROPOFOL;  Surgeon: [name], MD;  Location: [facility] ENDOSCOPY;  Service: Endoscopy;  Laterality: N/A;  . COLONOSCOPY WITH PROPOFOL N/A 05/31/2017   Procedure: COLONOSCOPY WITH PROPOFOL;  Surgeon: [name], MD;  Location: [facility] ENDOSCOPY;  Service: Endoscopy;  Laterality: N/A;  . ESOPHAGOGASTRODUODENOSCOPY (EGD) WITH PROPOFOL N/A 01/11/2017  Procedure: ESOPHAGOGASTRODUODENOSCOPY (EGD) WITH PROPOFOL;  Surgeon: [name], MD;  Location: [facility] ENDOSCOPY;  Service: Endoscopy;  Laterality: N/A;  . HERNIA REPAIR     umbilical  . HIATAL HERNIA REPAIR    . I&D EXTREMITY Left 06/27/2015   Procedure: IRRIGATION AND DEBRIDEMENT EXTREMITY            ( CALF HEMATOMA ) POSSIBLE WOUND VAC;  Surgeon: [name], MD;  Location: [facility] ORS;  Service: Vascular;  Laterality: Left;  . INCISION AND DRAINAGE OF WOUND Left 09/25/2016   Procedure: IRRIGATION AND DEBRIDEMENT - PARTIAL RESECTION OF ACHILLES TENDON WITH WOUND VAC APPLICATION;  Surgeon: [name], DPM;  Location: [facility] ORS;  Service: Podiatry;   Laterality: Left;  . IRRIGATION AND DEBRIDEMENT ABSCESS Left 09/07/2016   Procedure: IRRIGATION AND DEBRIDEMENT ABSCESS LEFT HEEL;  Surgeon: [name], DPM;  Location: [facility] ORS;  Service: Podiatry;  Laterality: Left;  . JOINT REPLACEMENT  2013   left knee replacement  . KIDNEY SURGERY Right    kidney stones  . LITHOTRIPSY    . NM [facility] HX)  February 2017   Likely breast attenuation. LOW RISK study. Normal EF 55-60%.  . OTHER SURGICAL HISTORY     08/2016 or 09/2016 surgery on achilles tenso h/o staph infection heal. Dr. [name]  . removal of left hematoma Left    leg  . REVERSE SHOULDER ARTHROPLASTY Right 10/08/2015   Procedure: REVERSE SHOULDER ARTHROPLASTY;  Surgeon: [name], MD;  Location: [facility] ORS;  Service: Orthopedics;  Laterality: Right;  . SLEEVE GASTROPLASTY    . TONSILLECTOMY    . TOTAL KNEE ARTHROPLASTY    . TOTAL SHOULDER REPLACEMENT Right   . TRANSESOPHAGEAL ECHOCARDIOGRAM  08/08/2013   Mild LVH, EF 60-65%. Moderate LA dilation and mild RA dilation. Mild MR with no evidence of stenosis and no evidence of endocarditis. A false chordae is noted.  . TRANSTHORACIC ECHOCARDIOGRAM  08/03/2013   Mild-Moderate concentric LVH, EF 60-65%. Normal diastolic function. Mild LA dilation. Mild MR with possible vegetation  - not confirmed on TEE   . ULNAR NERVE TRANSPOSITION Right 08/06/2016   Procedure: ULNAR NERVE DECOMPRESSION/TRANSPOSITION;  Surgeon: [name], MD;  Location: [facility] ORS;  Service: Orthopedics;  Laterality: Right;  . UPPER GI ENDOSCOPY    . VAGINAL HYSTERECTOMY      There were no vitals filed for this visit.  Subjective Assessment - 08/05/17 1651    Subjective  Patient is doing well and is able to walk with sneakers on today.     Patient is accompained by:  Family member    Pertinent History  Patient presents with hx of DM, PVD, peripheral neuropathy. In June 2018 patient suffered staph infection and subsequent necrosis of L Achilles tendon  requiring partial resection. Patient has suffered three falls in last six months, including one where she felt a "pop" in the hip while getting out of the bathtub. Went to MD two days later and received x-ray, negative; diagnosed with muscle strain and was recommended exercises. ER visit on 1/28 following third fall onto L hip, diagnosed with hematoma. Patient reports reduced sensation in feet L > R. Requires assistance to get into and out of bed since tendon surgery. Patient uses spc for ambulation and reports that small steps do not cause pain.    Limitations  Walking;Sitting;Standing;Lifting;House hold activities    How long can you sit comfortably?  1 hr    How long can you stand comfortably?  n/a    How long can you walk comfortably?  40 min    Diagnostic tests  x-ray, negative, CT scan showed torn muscle    Patient Stated Goals  Improve balance, reduce fall risk, and can go somewhere without FOF    Currently in Pain?  No/denies    Pain Score  0-No pain    Pain Onset  1 to 4 weeks ago    Multiple Pain Sites  No    Pain Onset  1 to 4 weeks ago      Treatment; Outcome measures performed with progress towards all goals  TM walking . 5 miles/ hour x 5 mins   Therapeutic exercises:  Supine marchin hooklying with TA,2x10 with cues for increased hip flexion 2x10 Supine bridge with cues to stabilize pelvis 2x10 Supine SLR 4x5 BLE Supine hip abd/add with cues to keep L leg in neutral and not to ER sidelying clamshell 3x5 BLE, cue to perform slowly    There were no vitals taken for this visit.      Past Medical History:  Diagnosis Date  . Abnormal antibody titer   . Anxiety and depression   . Arthritis   . Asthma   . Cystocele   . Depression   . Diabetes ([place])   . DVT (deep venous thrombosis) (HCC)    Righ calf  . Dysrhythmia   . Edema   . GERD (gastroesophageal reflux disease)   . Heart murmur    Echocardiogram June 2015: Mild MR (possible vegetation seen on TTE, not  seen on TEE), normal LV size with moderate concentric LVH. Normal function EF 60-65%. Normal diastolic function. Mild LA dilation.  Marland Kitchen History of IBS   . History of methicillin resistant staphylococcus aureus (MRSA) 2008  . Hyperlipidemia   . Hypertension   . Hypothyroidism   . Insomnia   . Kidney stone    kidney stones with lithotripsy  . Neuropathy involving both lower extremities   . Obesity, Class II, BMI 35-39.9, with comorbidity   . Paroxysmal atrial fibrillation ([place]) 2007   In June 2015, Cardiac Event Monitor: Mostly SR/sinus arrhythmia with PVCs that are frequent. Short bursts of A. fib lasting several minutes;; CHA2DS2-VASc Score = 5 (age, Female, PVD, DM, HTN)  . Peripheral vascular disease ([place])   . Sleep apnea    use C-PAP  . Urinary incontinence   . Venous stasis dermatitis of both lower extremities              Diet Order    None                       Consult Orders     CBG Orders     Abnormal Labs    [place], [place] DPT 08/05/2017, 5:51 PM                          PT Education - 08/05/17 1701    Education provided  Yes    Education Details  HEP    Person(s) Educated  Patient    Methods  Explanation    Comprehension  Verbalized understanding       PT Short Term Goals - 08/05/17 1726      PT SHORT TERM GOAL #1   Title  Patient will be independent in home exercise program to improve strength/mobility for better functional independence with bed mobility and ADLs.    Baseline  No HEP    Time  4    Period  Weeks    Status  On-going    Target Date  08/24/17      PT SHORT TERM GOAL #2   Title  Patient will report a worst pain score of 3/10 VAS in the L hip to improve tolerance with ADLs and reduce symptoms with bed mobility activities.      Baseline  6/10 with medication; 07/06/17 4/10 right hip buttocks 4/10 buttocks pain 08/05/17    Time  4    Period  Weeks    Status  Partially Met    Target Date   08/24/17      PT SHORT TERM GOAL #3   Title  Patient will demonstrate sit to supine bed mobility, supine to sit bed mobility, and rolling L and R with minimal assistance for improved functional mobility and independence    Baseline  Patient requires mod-max A for bed mobility; independent with more time    Time  4    Period  Weeks    Status  Achieved        PT Long Term Goals - 08/05/17 1728      PT LONG TERM GOAL #1   Title  Patient (>69 years old) will improve 10-meter walk test to 0.6 meters/sec with least restrictive AD to demonstrate reduced fall risk and capacity for limited community ambulation.    Baseline  0.21 meters/sec, 05/25/17 = .21 m/sec, . 40 m/sec 06/29/17; 07/06/17;  .40 m/sec  08/05/17 . 65 m/sec    Time  8    Period  Weeks    Status  Partially Met    Target Date  08/24/17      PT LONG TERM GOAL #2   Title  Patient will improve LEFS score to 40/80 to demonstrate improved functional mobility and independence with ADLs    Baseline  9/80; 05/25/17= 12/80, 20/80 06/29/17; 07/06/17:29/80 ; 08/05/17  34/80    Time  8    Period  Weeks    Status  On-going    Target Date  08/24/17      PT LONG TERM GOAL #3   Title  Patient will demonstrate ability to perform bed mobility independently for improved functional mobility and independence.    Baseline  Patient is independent with bed mobitiy     Time  8    Period  Weeks    Status  Achieved      PT LONG TERM GOAL #4   Title  Patient will increase six minute walk test distance to >1000 for progression to community ambulator and improve gait ability    Baseline  225 ft; 07/06/17:  08/05/17 280 ft     Time  8    Period  Weeks    Status  On-going    Target Date  08/24/17            Plan - 08/05/17 1713    Clinical Impression Statement Patient's condition has the potential to improve in response to therapy. Maximum improvement is yet to be obtained. The anticipated improvement is attainable and reasonable in a generally  predictable time. Start date of reporting period 07/06/17  end date of reporting period 08/05/17. Patient reports  Patient is able to perform LE exercises against gravity and in open chain and closed chain positions. She has improved gait speed and improved  mobility with transfers . She performs TM walking at . 5 miles / hour  for 5 minutes. Patient  is able to perform rolling and supine to sit and sit to supine with supervision. She will continue to benefit from skilled PT to improve her mobiltiy and gait.     Rehab Potential  Fair    Clinical Impairments Affecting Rehab Potential  - comorbidities, age, severity / + family support, acute injury    PT Frequency  2x / week    PT Duration  8 weeks    PT Treatment/Interventions  Aquatic Therapy;ADLs/Self Care Home Management;Cryotherapy;Electrical Stimulation;Moist Heat;Ultrasound;DME Instruction;Gait training;Stair training;Functional mobility training;Therapeutic activities;Therapeutic exercise;Balance training;Neuromuscular re-education;Patient/family education;Manual techniques;Compression bandaging;Scar mobilization;Passive range of motion;Dry needling;Energy conservation;Taping    PT Next Visit Plan  LE strengthening    Consulted and Agree with Plan of Care  Patient       Patient will benefit from skilled therapeutic intervention in order to improve the following deficits and impairments:  Abnormal gait, Decreased activity tolerance, Decreased balance, Decreased endurance, Decreased coordination, Decreased mobility, Decreased range of motion, Decreased safety awareness, Difficulty walking, Decreased strength, Hypomobility, Impaired perceived functional ability, Impaired UE functional use, Obesity, Impaired flexibility, Pain  Visit Diagnosis: Difficulty in walking, not elsewhere classified  Muscle weakness (generalized)  Pain in left hip     Problem List Patient Active Problem List   Diagnosis Date Noted  . Adrenal mass ([place]) 08/02/2017  .  Chronic atrial fibrillation ([place]) 08/02/2017  . Eczema 06/27/2017  . Diabetic ulcer of left foot ([place]) 06/24/2017  . Mass of both adrenal glands ([place]) 05/16/2017  . Fatty liver 05/13/2017  . Pain of left hip joint 04/22/2017  . Abnormality of gait and mobility 04/22/2017  . Fall with injury 02/02/2017  . Anemia 11/18/2016  . Asthma 11/18/2016  . Carpal tunnel syndrome 11/18/2016  . Pain in joint involving ankle and foot 11/18/2016  . Obstructive sleep apnea of adult 11/18/2016  . Spinal stenosis of thoracic region 11/18/2016  . Abscess of tendon of left foot 09/21/2016  . Cubital tunnel syndrome on right 08/07/2016  . Chronic pain syndrome 02/25/2016  . Long term current use of opiate analgesic 02/25/2016  . Chronic right shoulder pain 02/25/2016  . Status post reverse total shoulder replacement 10/08/2015  . 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 Morbid obesity with BMI of 40.0-44.9, adult ([place]) 09/23/2015  . Chronic prescription benzodiazepine use 09/23/2015  . Osteoarthritis, multiple sites 09/23/2015  . Other shoulder lesions (Right) 08/28/2015  . Cervical radiculitis 08/06/2015  . Opiate use (15 MME/Day) 01/16/2015  . Impingement syndrome of shoulder region 01/07/2015  . Mixed incontinence 11/26/2014  . Atrophic vaginitis 11/26/2014  . Anxiety and depression 09/21/2014  . Bladder cystocele 09/21/2014  . Gastro-esophageal reflux disease without esophagitis 09/21/2014  . DM (diabetes mellitus) type II, controlled, with peripheral vascular disorder ([place]) 08/31/2014  . Diabetic peripheral neuropathy associated with type 2 diabetes mellitus ([place]) 08/31/2014  . Asthma, moderate persistent, well-controlled 08/31/2014  . Hypothyroidism, adult 08/31/2014  . Peripheral vascular disease due to secondary diabetes mellitus ([place]) 12/11/2013  . Apnea, sleep 10/19/2013  . Paroxysmal atrial fibrillation ([place]); CHA2DS2-Vasc Score 5. On Pradaxa 07/27/2013  .  Essential hypertension 07/27/2013    Alanson Puls, [place] DPT 08/05/2017, 5:30 PM  [place] MAIN [facility] SERVICES [address] [place], [place], [address] Phone: [phone]   Fax:  [phone]  Name: [name] MRN: [id number] Date of Birth: 1950-11-28

## 2017-08-08 IMAGING — CR DG TIBIA/FIBULA 2V*L*
1 series · 4 of 4 positions shown · non-contrast
Comparison: Of LEFT foot radiographs 02/16/2012

CLINICAL DATA: Fell today, dropped chair on leg 3 days ago,
bruising mostly at midshaft LEFT fibula

EXAM:
LEFT TIBIA AND FIBULA - 2 VIEW

[Series 1: dg tibia/fibula left · 0.14mm/px · 4 of 4 slices shown]
[im 1/4]
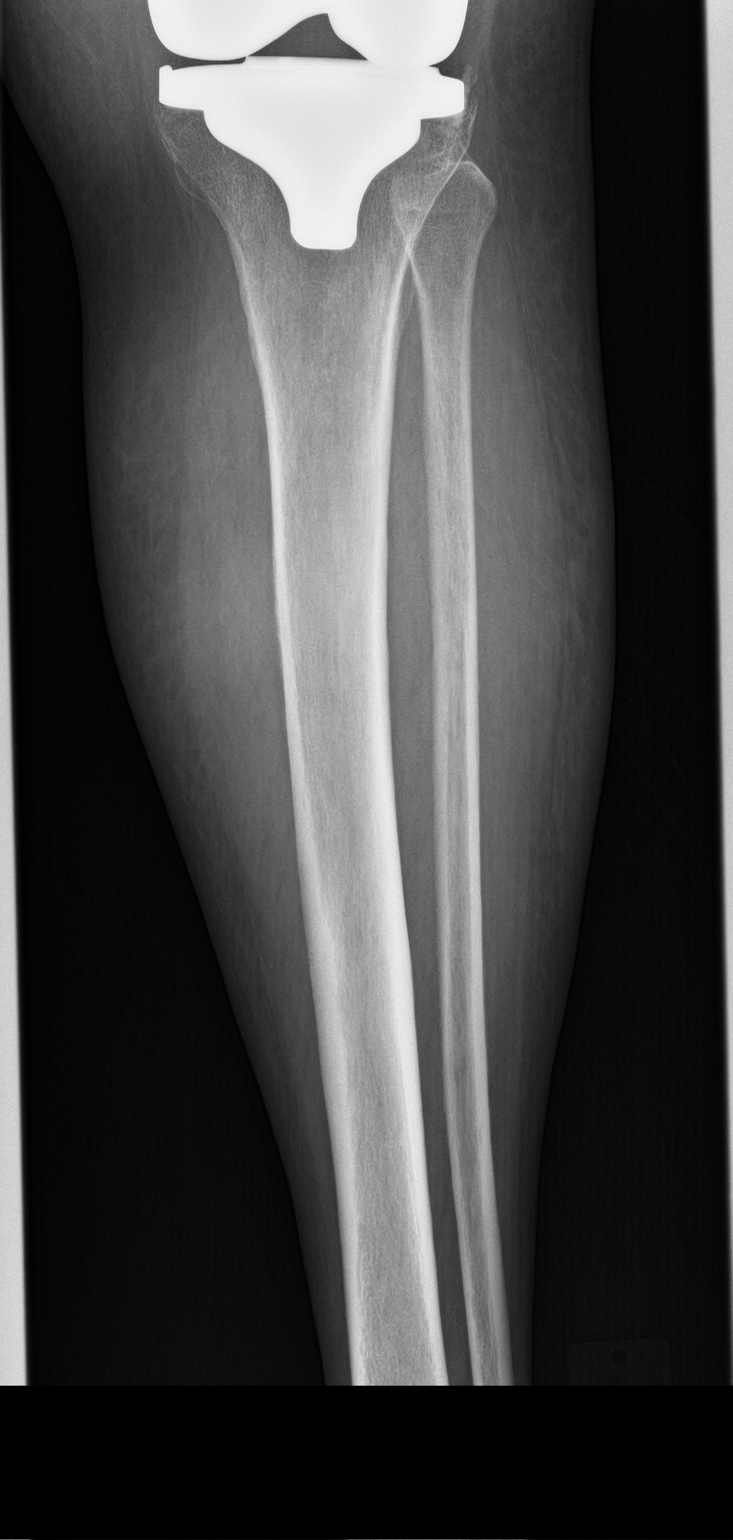
[im 2/4]
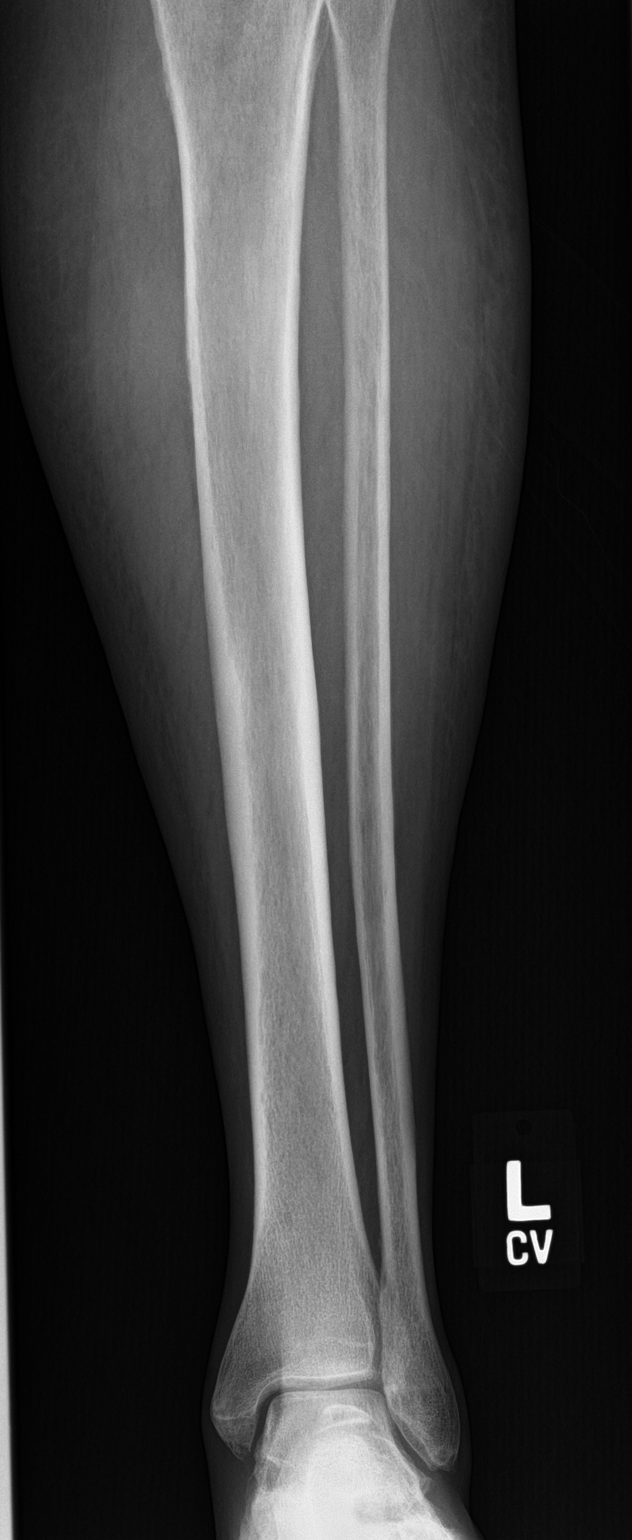
[im 3/4]
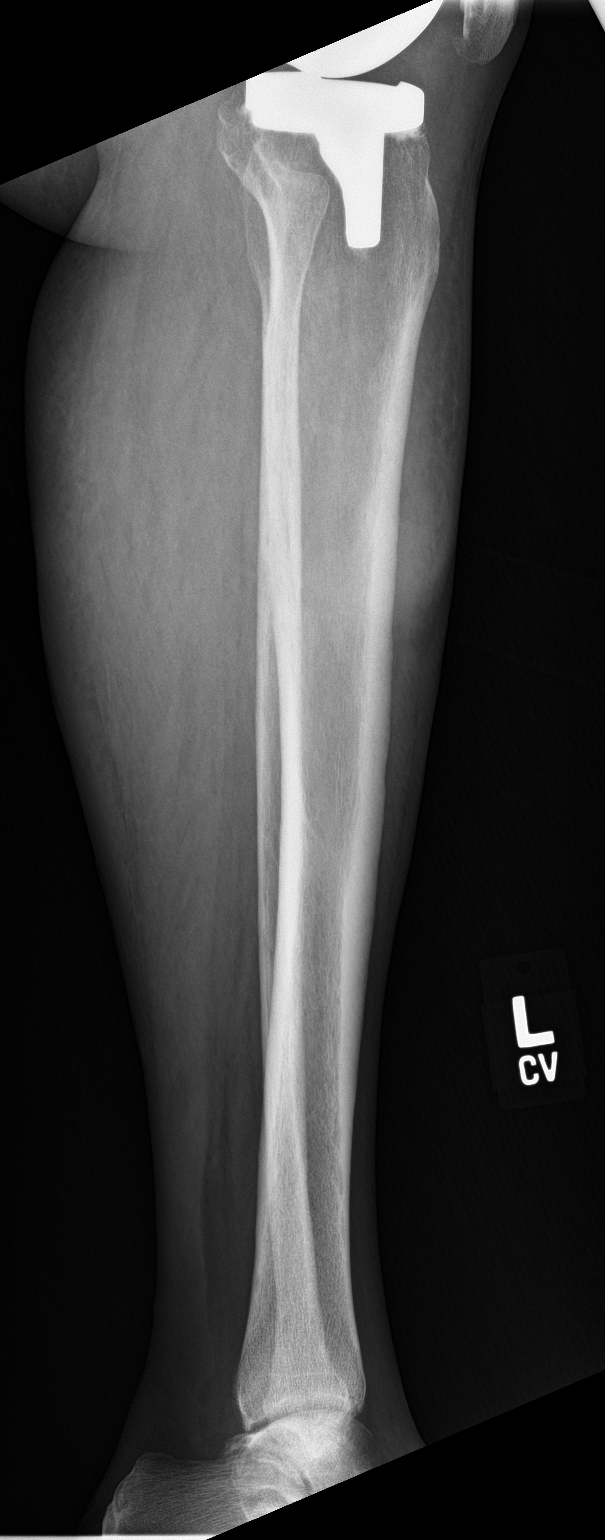
[im 4/4]
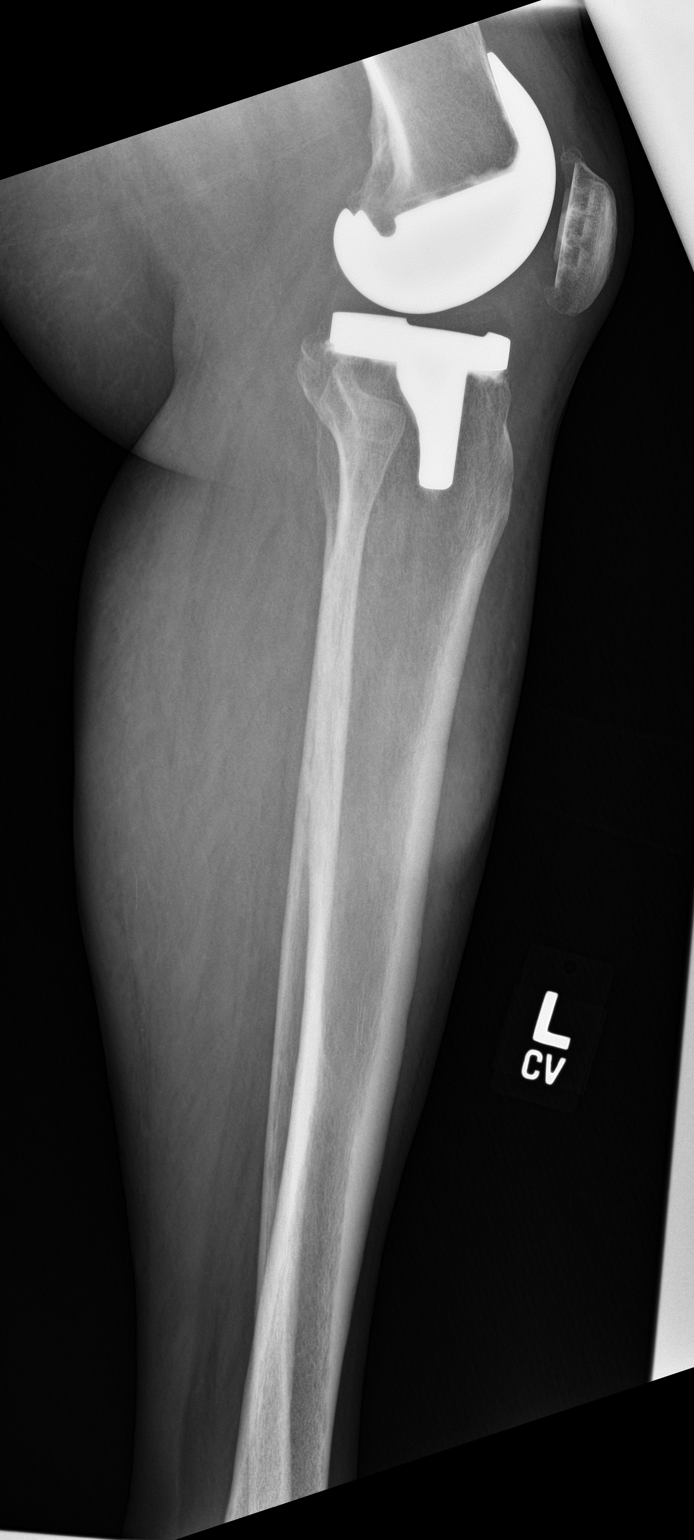

[4 of 4 positions shown; findings below may reference images not displayed]

FINDINGS: Components of LEFT knee prosthesis identified.

No periprosthetic lucency.

Bones demineralized.

Ankle joint alignment normal.

Mild pretibial soft tissue swelling at the proximal to mid LEFT
lower leg.

No acute fracture, dislocation, or bone destruction.
IMPRESSION: No acute osseous abnormalities.

Osseous demineralization with prior LEFT knee arthroplasty and
pretibial soft tissue swelling.

## 2017-08-08 IMAGING — CR DG FACIAL BONES COMPLETE 3+V
1 series · 5 of 5 positions shown · non-contrast
Comparison: None.

CLINICAL DATA: Fell bruising the nose and mandible

EXAM:
FACIAL BONES COMPLETE 3+V

[Series 1: dg facial bones complete · 0.14mm/px · 5 of 5 slices shown]
[im 1/5]
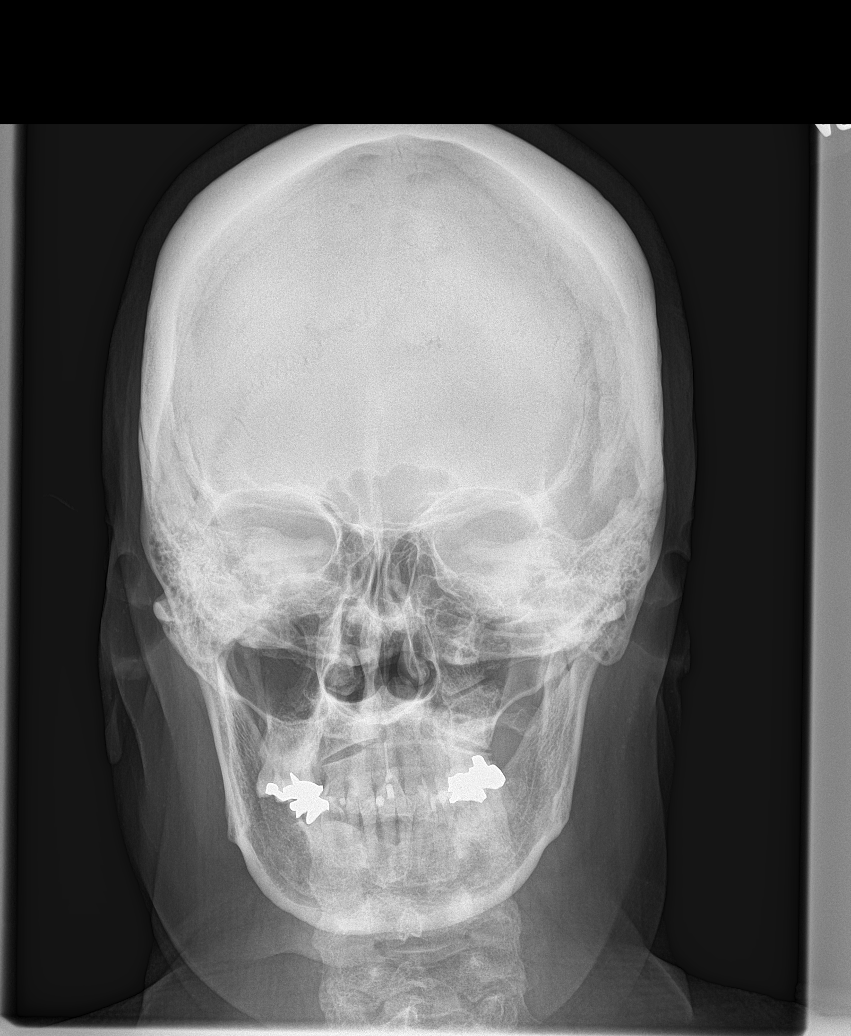
[im 2/5]
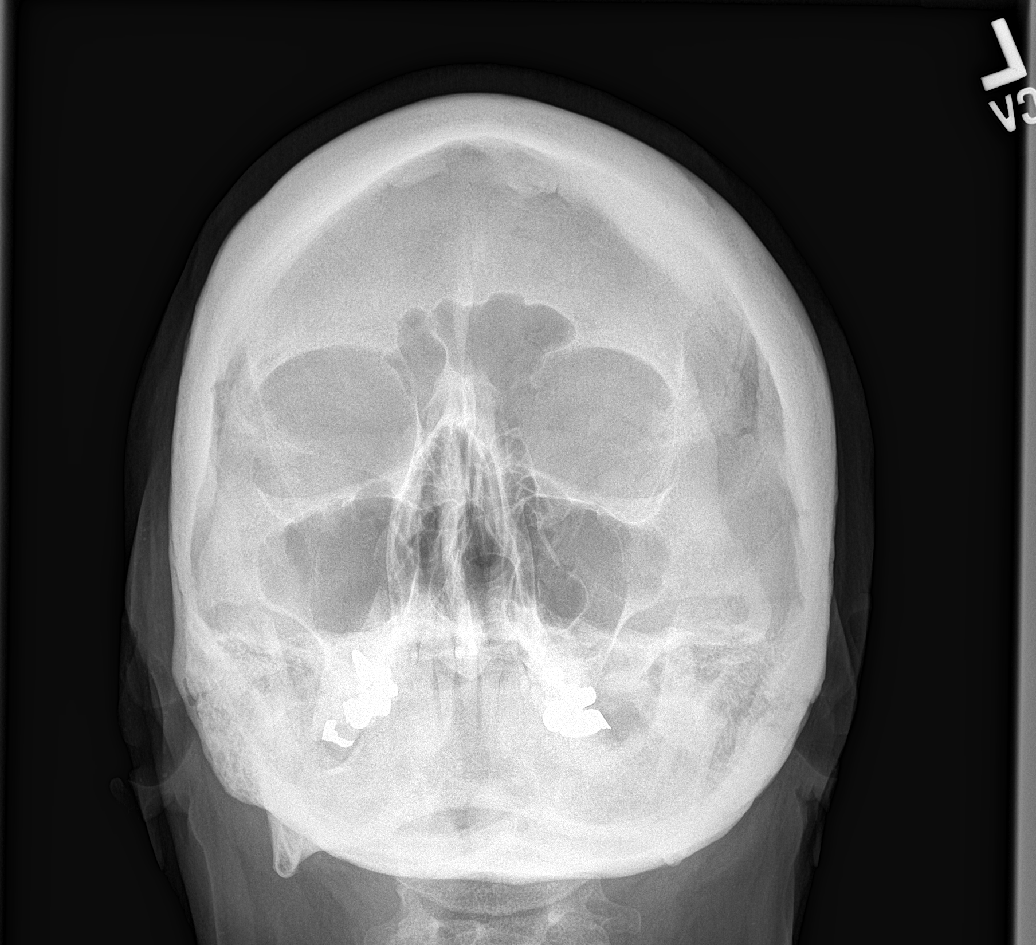
[im 3/5]
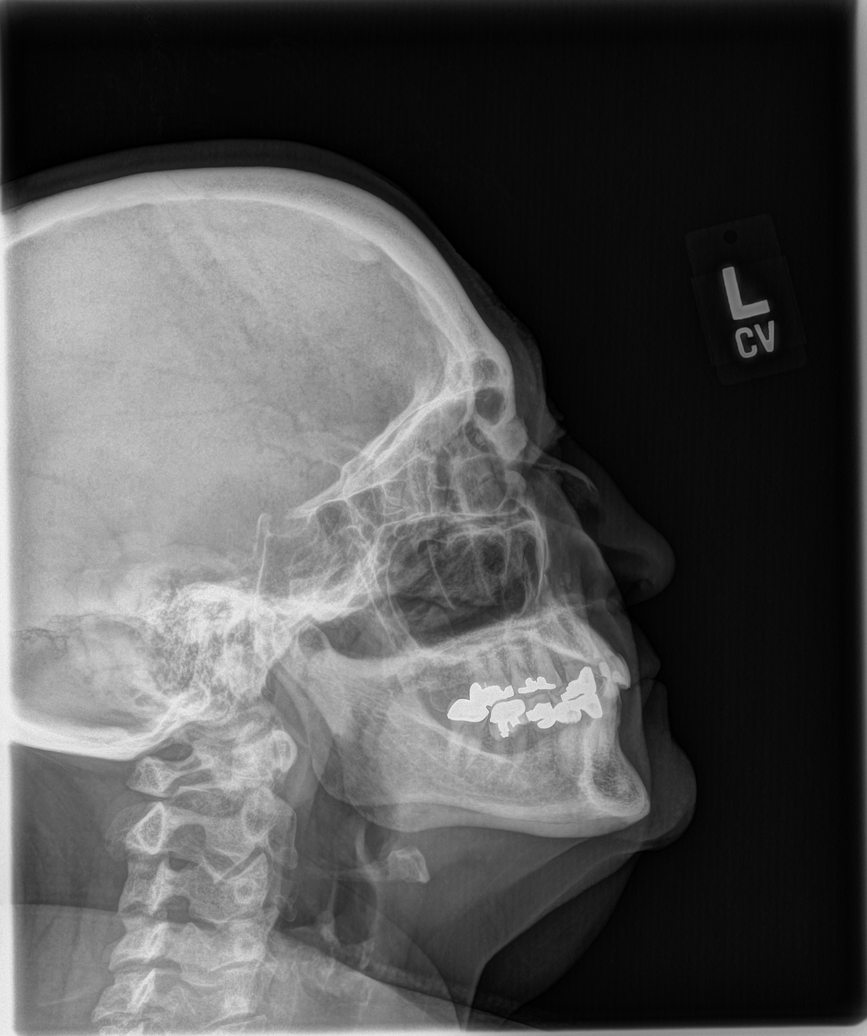
[im 4/5]
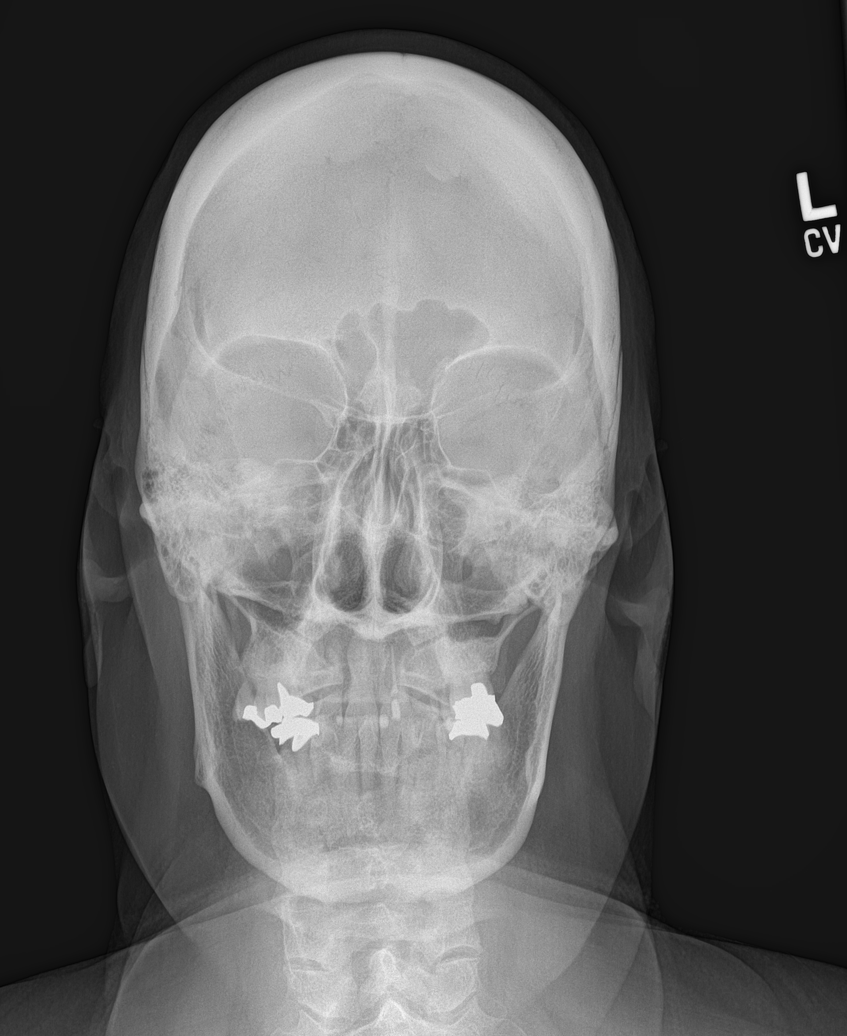
[im 5/5]
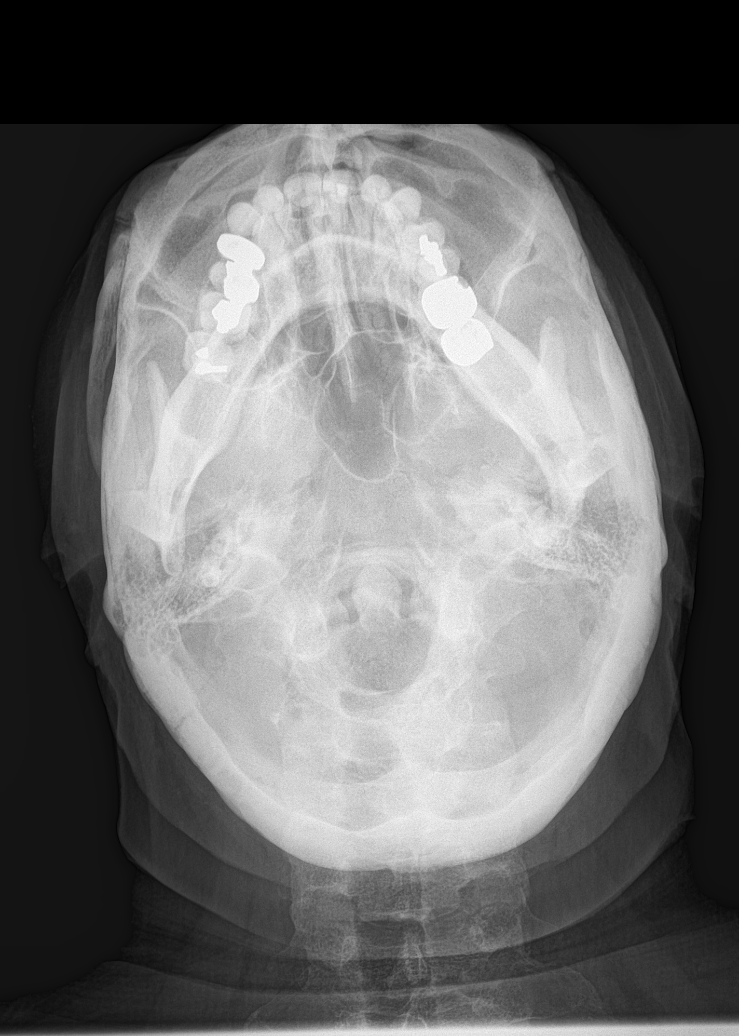

[5 of 5 positions shown; findings below may reference images not displayed]

FINDINGS: There is no evidence of maxillofacial fracture. The nasal bone on
the lateral view appears intact as is the nasal spine. The mandible
appears intact and the condyles are in normal position. The
paranasal sinuses are clear. No periorbital air is seen.
IMPRESSION: Negative.

## 2017-08-08 IMAGING — CR DG KNEE COMPLETE 4+V*R*
1 series · 4 of 4 positions shown · non-contrast
Comparison: None.

CLINICAL DATA: Acute right knee pain after fall today.

EXAM:
RIGHT KNEE - COMPLETE 4+ VIEW

[Series 1: dg knee complete 4 views right · 0.14mm/px · 4 of 4 slices shown]
[im 1/4]
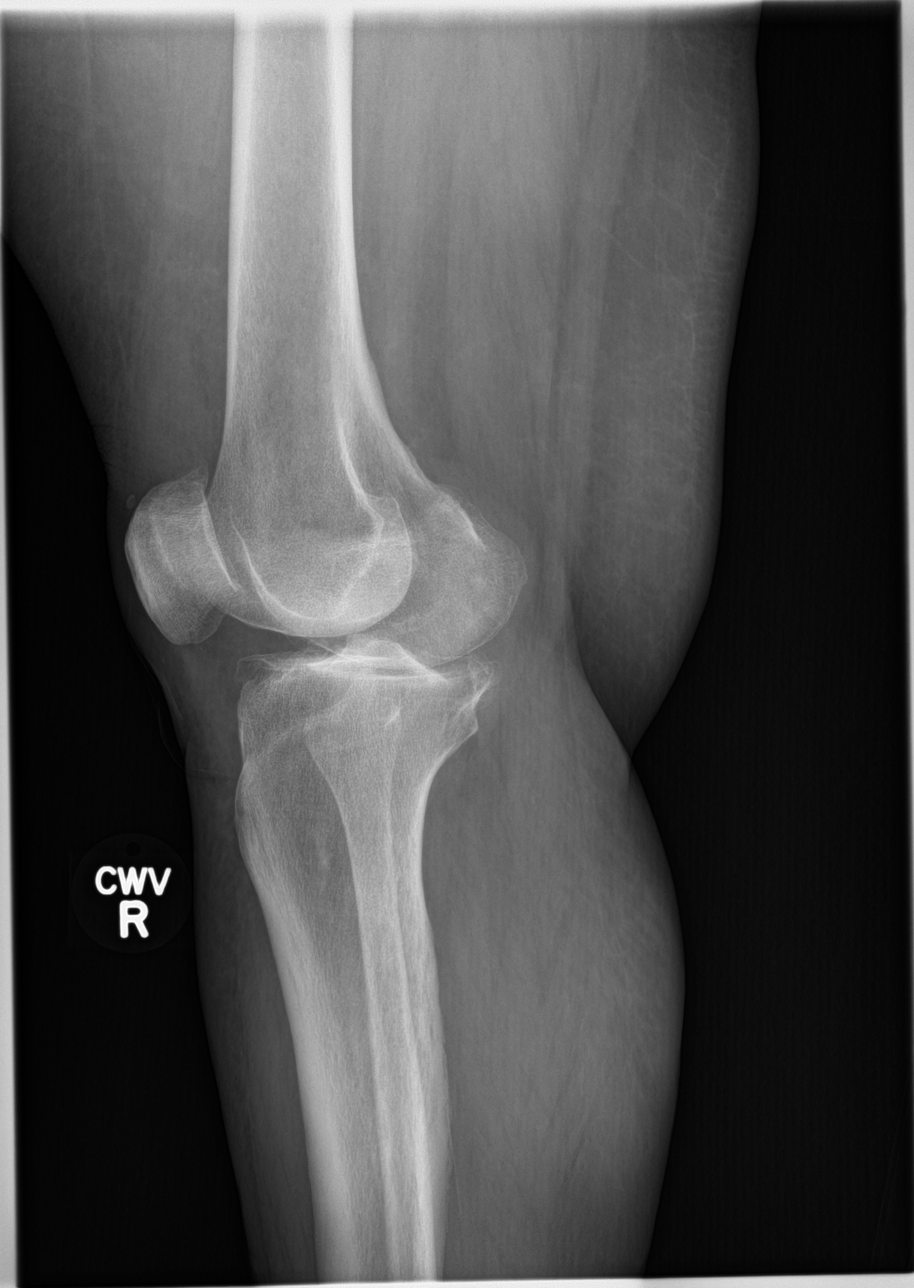
[im 2/4]
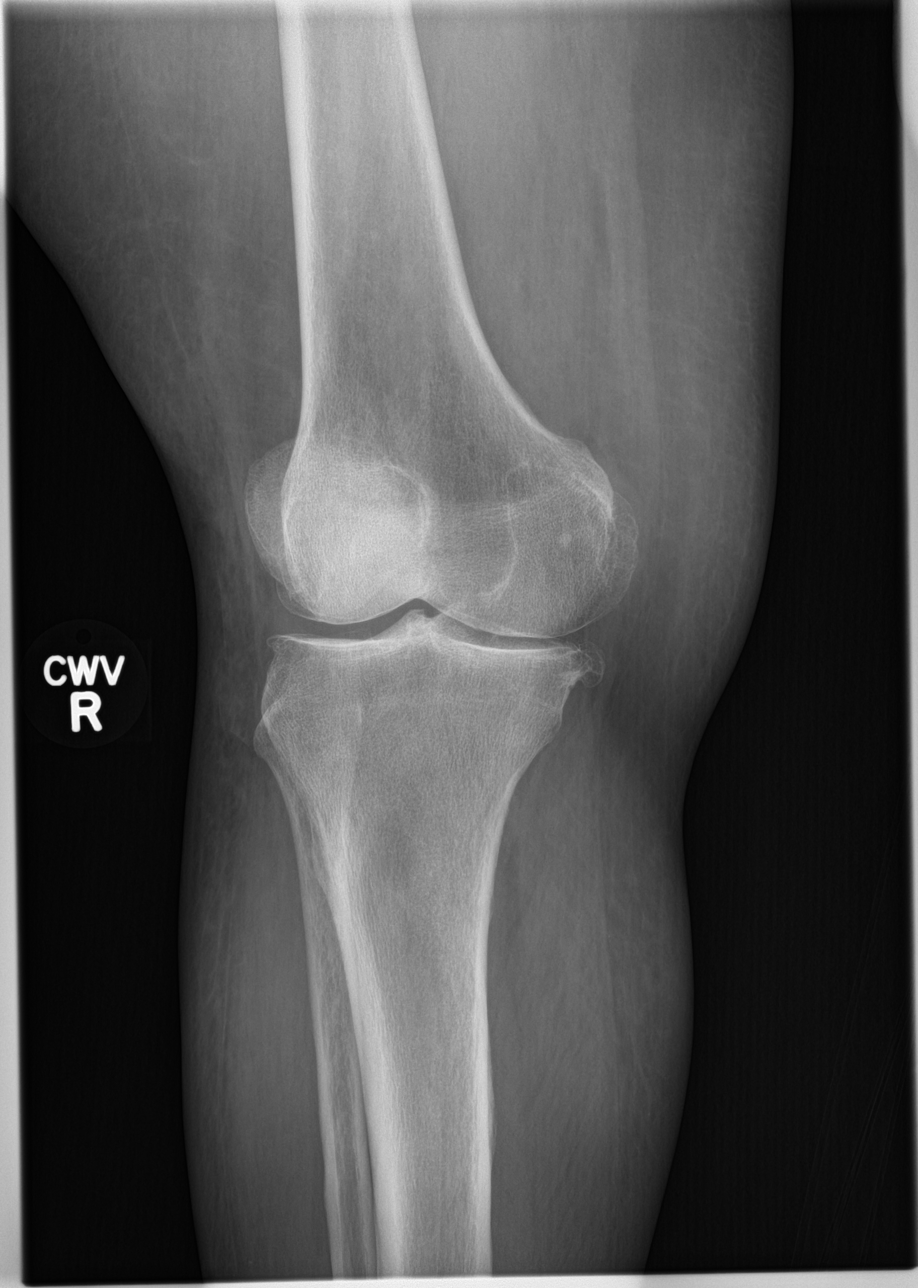
[im 3/4]
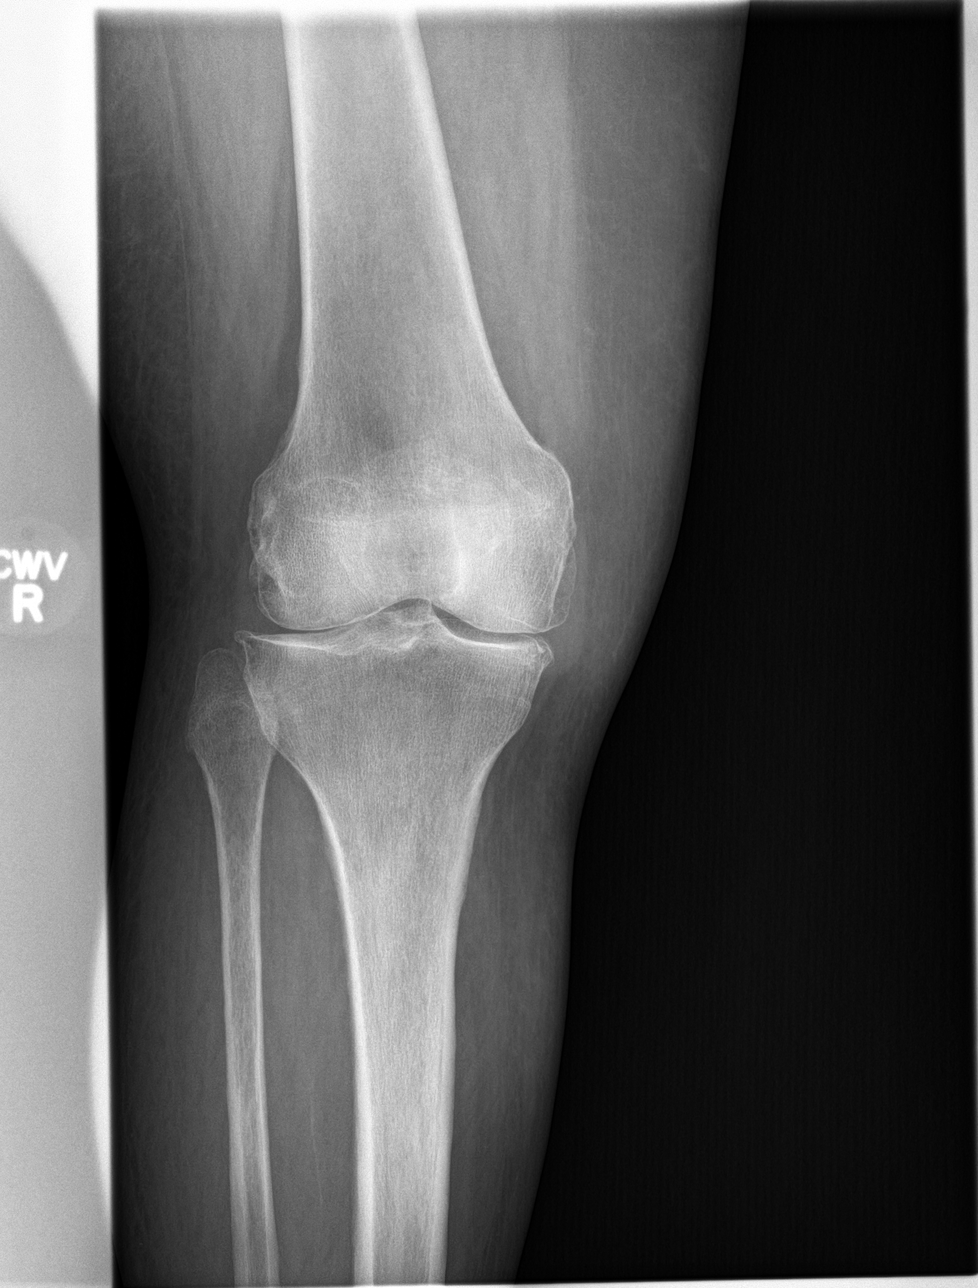
[im 4/4]
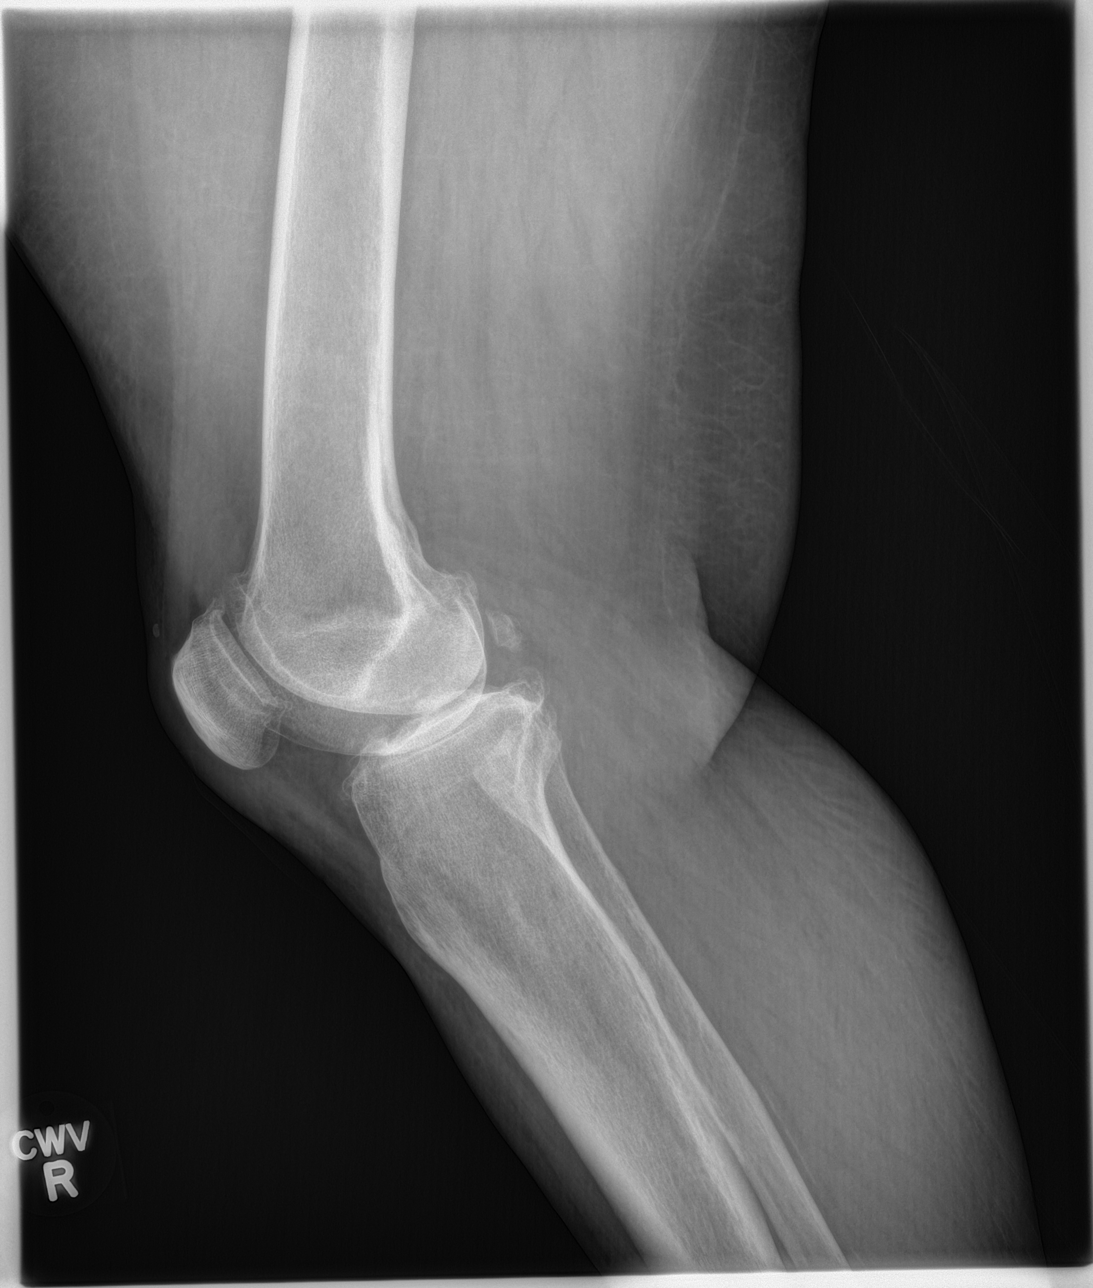

[4 of 4 positions shown; findings below may reference images not displayed]

FINDINGS: There is no evidence of fracture, dislocation, or joint effusion.
Moderate narrowing of both joint spaces is noted. Mild narrowing of
patellofemoral space is noted. Soft tissues are unremarkable.
IMPRESSION: Moderate degenerative joint disease. No acute abnormality seen in
the right knee.

## 2017-08-09 ENCOUNTER — Ambulatory Visit
Admission: RE | Admit: 2017-08-09 | Discharge: 2017-08-09 | Disposition: A | Discharge status: Home / Self Care | Payer: PPO | Source: Ambulatory Visit | Attending: Internal Medicine | Admitting: Internal Medicine

## 2017-08-09 ENCOUNTER — Encounter: Payer: Self-pay | Admitting: Physical Therapy

## 2017-08-09 ENCOUNTER — Ambulatory Visit: Payer: PPO | Admitting: Physical Therapy

## 2017-08-09 DIAGNOSIS — Z1231 Encounter for screening mammogram for malignant neoplasm of breast: Secondary | ICD-10-CM | POA: Diagnosis not present

## 2017-08-09 DIAGNOSIS — R262 Difficulty in walking, not elsewhere classified: Secondary | ICD-10-CM | POA: Diagnosis not present

## 2017-08-09 DIAGNOSIS — M25552 Pain in left hip: Secondary | ICD-10-CM

## 2017-08-09 DIAGNOSIS — M6281 Muscle weakness (generalized): Secondary | ICD-10-CM

## 2017-08-09 NOTE — Therapy (Signed)
[place] MAIN [facility] SERVICES [address] [place], [place], [address] Phone: [phone]   Fax:  [phone]  Physical Therapy Treatment  Patient Details  Name: [name] MRN: [id number] Date of Birth: Aug 08, 1950 Referring Provider: Dr. Roland Rack   Encounter Date: 08/09/2017  PT End of Session - 08/09/17 1512    Visit Number  16    Number of Visits  17    Date for PT Re-Evaluation  08/24/17    PT Start Time  0300    PT Stop Time  0345    PT Time Calculation (min)  45 min    Equipment Utilized During Treatment  Gait belt    Activity Tolerance  Patient tolerated treatment well;Patient limited by pain;Patient limited by fatigue    Behavior During Therapy  [facility] for tasks assessed/performed       Past Medical History:  Diagnosis Date  . Abnormal antibody titer   . Anxiety and depression   . Arthritis   . Asthma   . Cystocele   . Depression   . Diabetes ([place])   . DVT (deep venous thrombosis) (HCC)    Righ calf  . Dysrhythmia   . Edema   . GERD (gastroesophageal reflux disease)   . Heart murmur    Echocardiogram June 2015: Mild MR (possible vegetation seen on TTE, not seen on TEE), normal LV size with moderate concentric LVH. Normal function EF 60-65%. Normal diastolic function. Mild LA dilation.  Marland Kitchen History of IBS   . History of methicillin resistant staphylococcus aureus (MRSA) 2008  . Hyperlipidemia   . Hypertension   . Hypothyroidism   . Insomnia   . Kidney stone    kidney stones with lithotripsy  . Neuropathy involving both lower extremities   . Obesity, Class II, BMI 35-39.9, with comorbidity   . Paroxysmal atrial fibrillation ([place]) 2007   In June 2015, Cardiac Event Monitor: Mostly SR/sinus arrhythmia with PVCs that are frequent. Short bursts of A. fib lasting several minutes;; CHA2DS2-VASc Score = 5 (age, Female, PVD, DM, HTN)  . Peripheral vascular disease ([place])   . Sleep apnea    use C-PAP  . Urinary incontinence   .  Venous stasis dermatitis of both lower extremities     Past Surgical History:  Procedure Laterality Date  . ANKLE SURGERY Right   . APPLICATION OF WOUND VAC Left 06/27/2015   Procedure: APPLICATION OF WOUND VAC ( POSSIBLE ) ;  Surgeon: [name], MD;  Location: [facility] ORS;  Service: Vascular;  Laterality: Left;  . APPLICATION OF WOUND VAC Left 09/25/2016   Procedure: APPLICATION OF WOUND VAC;  Surgeon: [name], DPM;  Location: [facility] ORS;  Service: Podiatry;  Laterality: Left;  . arm surgery     right    . CHOLECYSTECTOMY    . COLONOSCOPY    . COLONOSCOPY WITH PROPOFOL N/A 01/11/2017   Procedure: COLONOSCOPY WITH PROPOFOL;  Surgeon: [name], MD;  Location: [facility] ENDOSCOPY;  Service: Endoscopy;  Laterality: N/A;  . COLONOSCOPY WITH PROPOFOL N/A 02/22/2017   Procedure: COLONOSCOPY WITH PROPOFOL;  Surgeon: [name], MD;  Location: [facility] ENDOSCOPY;  Service: Endoscopy;  Laterality: N/A;  . COLONOSCOPY WITH PROPOFOL N/A 05/31/2017   Procedure: COLONOSCOPY WITH PROPOFOL;  Surgeon: [name], MD;  Location: [facility] ENDOSCOPY;  Service: Endoscopy;  Laterality: N/A;  . ESOPHAGOGASTRODUODENOSCOPY (EGD) WITH PROPOFOL N/A 01/11/2017   Procedure: ESOPHAGOGASTRODUODENOSCOPY (EGD) WITH PROPOFOL;  Surgeon: [name], MD;  Location: [facility]  ENDOSCOPY;  Service: Endoscopy;  Laterality: N/A;  . HERNIA REPAIR     umbilical  . HIATAL HERNIA REPAIR    . I&D EXTREMITY Left 06/27/2015   Procedure: IRRIGATION AND DEBRIDEMENT EXTREMITY            ( CALF HEMATOMA ) POSSIBLE WOUND VAC;  Surgeon: [name], MD;  Location: [facility] ORS;  Service: Vascular;  Laterality: Left;  . INCISION AND DRAINAGE OF WOUND Left 09/25/2016   Procedure: IRRIGATION AND DEBRIDEMENT - PARTIAL RESECTION OF ACHILLES TENDON WITH WOUND VAC APPLICATION;  Surgeon: [name], DPM;  Location: [facility] ORS;  Service: Podiatry;  Laterality: Left;  . IRRIGATION AND DEBRIDEMENT ABSCESS Left 09/07/2016   Procedure:  IRRIGATION AND DEBRIDEMENT ABSCESS LEFT HEEL;  Surgeon: [name], DPM;  Location: [facility] ORS;  Service: Podiatry;  Laterality: Left;  . JOINT REPLACEMENT  2013   left knee replacement  . KIDNEY SURGERY Right    kidney stones  . LITHOTRIPSY    . NM [facility] HX)  February 2017   Likely breast attenuation. LOW RISK study. Normal EF 55-60%.  . OTHER SURGICAL HISTORY     08/2016 or 09/2016 surgery on achilles tenso h/o staph infection heal. Dr. [name]  . removal of left hematoma Left    leg  . REVERSE SHOULDER ARTHROPLASTY Right 10/08/2015   Procedure: REVERSE SHOULDER ARTHROPLASTY;  Surgeon: [name], MD;  Location: [facility] ORS;  Service: Orthopedics;  Laterality: Right;  . SLEEVE GASTROPLASTY    . TONSILLECTOMY    . TOTAL KNEE ARTHROPLASTY    . TOTAL SHOULDER REPLACEMENT Right   . TRANSESOPHAGEAL ECHOCARDIOGRAM  08/08/2013   Mild LVH, EF 60-65%. Moderate LA dilation and mild RA dilation. Mild MR with no evidence of stenosis and no evidence of endocarditis. A false chordae is noted.  . TRANSTHORACIC ECHOCARDIOGRAM  08/03/2013   Mild-Moderate concentric LVH, EF 60-65%. Normal diastolic function. Mild LA dilation. Mild MR with possible vegetation  - not confirmed on TEE   . ULNAR NERVE TRANSPOSITION Right 08/06/2016   Procedure: ULNAR NERVE DECOMPRESSION/TRANSPOSITION;  Surgeon: [name], MD;  Location: [facility] ORS;  Service: Orthopedics;  Laterality: Right;  . UPPER GI ENDOSCOPY    . VAGINAL HYSTERECTOMY      There were no vitals filed for this visit.  Subjective Assessment - 08/09/17 1509    Subjective  Patient is doing well and is able to walk with sneakers on today. She wants to be able to get up without help to her bed which has a high step.     Patient is accompained by:  Family member    Pertinent History  Patient presents with hx of DM, PVD, peripheral neuropathy. In June 2018 patient suffered staph infection and subsequent necrosis of L Achilles tendon requiring  partial resection. Patient has suffered three falls in last six months, including one where she felt a "pop" in the hip while getting out of the bathtub. Went to [facility] two days later and received x-ray, negative; diagnosed with muscle strain and was recommended exercises. ER visit on 1/28 following third fall onto L hip, diagnosed with hematoma. Patient reports reduced sensation in feet L > R. Requires assistance to get into and out of bed since tendon surgery. Patient uses spc for ambulation and reports that small steps do not cause pain.    Limitations  Walking;Sitting;Standing;Lifting;House hold activities    How long can you sit comfortably?  1 hr    How long can  you stand comfortably?  n/a    How long can you walk comfortably?  40 min    Diagnostic tests  x-ray, negative, CT scan showed torn muscle    Patient Stated Goals  Improve balance, reduce fall risk, and can go somewhere without FOF    Currently in Pain?  Yes    Pain Score  7     Pain Location  Buttocks    Pain Orientation  Right    Pain Descriptors / Indicators  Sharp;Stabbing    Pain Type  Chronic pain    Pain Onset  1 to 4 weeks ago    Aggravating Factors   only hurts with sit to stand or for stepping up to the step    Effect of Pain on Daily Activities  unable to go up to a step    Pain Onset  1 to 4 weeks ago          Therapeutic exercises:  Supine marchin hooklying with TA,20 x 2 with cues for increased hip flexion 2x10 Supine bridge with cues to stabilize pelvis 2x10 Supine SLR  X 20  BLE Supine hip abd/add with cues to keep L leg in neutral and not to ER with 3 lbs x 20  sidelying clamshell 3x5 BLE, cue to perform slowly SAQ  With 3 lbs x 20  Bridges x 10 x 2  Gait training with rollator for 300 feet with open back shoe and getter gait speed with SBA   Patient needs moderat  cues for correct form and position of LE to perform correctly                     PT Education - 08/09/17 1512     Education provided  Yes    Education Details  HEP    Person(s) Educated  Patient    Methods  Explanation;Demonstration    Comprehension  Verbalized understanding;Returned demonstration       PT Short Term Goals - 08/05/17 1726      PT SHORT TERM GOAL #1   Title  Patient will be independent in home exercise program to improve strength/mobility for better functional independence with bed mobility and ADLs.    Baseline  No HEP    Time  4    Period  Weeks    Status  On-going    Target Date  08/24/17      PT SHORT TERM GOAL #2   Title  Patient will report a worst pain score of 3/10 VAS in the L hip to improve tolerance with ADLs and reduce symptoms with bed mobility activities.      Baseline  6/10 with medication; 07/06/17 4/10 right hip buttocks 4/10 buttocks pain 08/05/17    Time  4    Period  Weeks    Status  Partially Met    Target Date  08/24/17      PT SHORT TERM GOAL #3   Title  Patient will demonstrate sit to supine bed mobility, supine to sit bed mobility, and rolling L and R with minimal assistance for improved functional mobility and independence    Baseline  Patient requires mod-max A for bed mobility; independent with more time    Time  4    Period  Weeks    Status  Achieved        PT Long Term Goals - 08/05/17 1728      PT LONG TERM GOAL #1   Title  Patient (>57 years old) will improve 10-meter walk test to 0.6 meters/sec with least restrictive AD to demonstrate reduced fall risk and capacity for limited community ambulation.    Baseline  0.21 meters/sec, 05/25/17 = .21 m/sec, . 40 m/sec 06/29/17; 07/06/17;  .40 m/sec  08/05/17 . 65 m/sec    Time  8    Period  Weeks    Status  Partially Met    Target Date  08/24/17      PT LONG TERM GOAL #2   Title  Patient will improve LEFS score to 40/80 to demonstrate improved functional mobility and independence with ADLs    Baseline  9/80; 05/25/17= 12/80, 20/80 06/29/17; 07/06/17:29/80 ; 08/05/17  34/80    Time  8    Period  Weeks     Status  On-going    Target Date  08/24/17      PT LONG TERM GOAL #3   Title  Patient will demonstrate ability to perform bed mobility independently for improved functional mobility and independence.    Baseline  Patient is independent with bed mobitiy     Time  8    Period  Weeks    Status  Achieved      PT LONG TERM GOAL #4   Title  Patient will increase six minute walk test distance to >1000 for progression to community ambulator and improve gait ability    Baseline  225 ft; 07/06/17:  08/05/17 280 ft     Time  8    Period  Weeks    Status  On-going    Target Date  08/24/17            Plan - 08/09/17 1513    Clinical Impression Statement  Patient was assessed for LE strength today and has -2/5 hip extension and add strength bilaterally, 3+/5 hip abduction bilaterally. Patient instructed in LE strengthening including closed chain and open chain exercises for hip strengthening with cues for posture and correct positioning of LE during side lying abduction.  She is able to perform all exercise's without pain behaviors. She will benefit from skilled PT to improve long distance walking and return to her prior level and decrease her falls risk.    Rehab Potential  Fair    Clinical Impairments Affecting Rehab Potential  - comorbidities, age, severity / + family support, acute injury    PT Frequency  2x / week    PT Duration  8 weeks    PT Treatment/Interventions  Aquatic Therapy;ADLs/Self Care Home Management;Cryotherapy;Electrical Stimulation;Moist Heat;Ultrasound;DME Instruction;Gait training;Stair training;Functional mobility training;Therapeutic activities;Therapeutic exercise;Balance training;Neuromuscular re-education;Patient/family education;Manual techniques;Compression bandaging;Scar mobilization;Passive range of motion;Dry needling;Energy conservation;Taping    PT Next Visit Plan  LE strengthening    Consulted and Agree with Plan of Care  Patient       Patient will benefit  from skilled therapeutic intervention in order to improve the following deficits and impairments:  Abnormal gait, Decreased activity tolerance, Decreased balance, Decreased endurance, Decreased coordination, Decreased mobility, Decreased range of motion, Decreased safety awareness, Difficulty walking, Decreased strength, Hypomobility, Impaired perceived functional ability, Impaired UE functional use, Obesity, Impaired flexibility, Pain  Visit Diagnosis: Difficulty in walking, not elsewhere classified  Muscle weakness (generalized)  Pain in left hip     Problem List Patient Active Problem List   Diagnosis Date Noted  . Adrenal mass ([employer]) 08/02/2017  . Chronic atrial fibrillation ([place]) 08/02/2017  . Eczema 06/27/2017  . Diabetic ulcer of left foot ([place]) 06/24/2017  . Mass  of both adrenal glands ([place]) 05/16/2017  . Fatty liver 05/13/2017  . Pain of left hip joint 04/22/2017  . Abnormality of gait and mobility 04/22/2017  . Fall with injury 02/02/2017  . Anemia 11/18/2016  . Asthma 11/18/2016  . Carpal tunnel syndrome 11/18/2016  . Pain in joint involving ankle and foot 11/18/2016  . Obstructive sleep apnea of adult 11/18/2016  . Spinal stenosis of thoracic region 11/18/2016  . Abscess of tendon of left foot 09/21/2016  . Cubital tunnel syndrome on right 08/07/2016  . Chronic pain syndrome 02/25/2016  . Long term current use of opiate analgesic 02/25/2016  . Chronic right shoulder pain 02/25/2016  . Status post reverse total shoulder replacement 10/08/2015  . 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 Morbid obesity with BMI of 40.0-44.9, adult ([place]) 09/23/2015  . Chronic prescription benzodiazepine use 09/23/2015  . Osteoarthritis, multiple sites 09/23/2015  . Other shoulder lesions (Right) 08/28/2015  . Cervical radiculitis 08/06/2015  . Opiate use (15 MME/Day) 01/16/2015  . Impingement syndrome of shoulder region 01/07/2015  . Mixed incontinence  11/26/2014  . Atrophic vaginitis 11/26/2014  . Anxiety and depression 09/21/2014  . Bladder cystocele 09/21/2014  . Gastro-esophageal reflux disease without esophagitis 09/21/2014  . DM (diabetes mellitus) type II, controlled, with peripheral vascular disorder ([place]) 08/31/2014  . Diabetic peripheral neuropathy associated with type 2 diabetes mellitus ([place]) 08/31/2014  . Asthma, moderate persistent, well-controlled 08/31/2014  . Hypothyroidism, adult 08/31/2014  . Peripheral vascular disease due to secondary diabetes mellitus ([place]) 12/11/2013  . Apnea, sleep 10/19/2013  . Paroxysmal atrial fibrillation ([place]); CHA2DS2-Vasc Score 5. On Pradaxa 07/27/2013  . Essential hypertension 07/27/2013    Alanson Puls, [place] DPT 08/09/2017, 4:03 PM  [place] MAIN [facility] SERVICES [address] [place], [place], [address] Phone: [phone]   Fax:  [phone]  Name: [name] MRN: [id number] Date of Birth: [birth year]

## 2017-08-11 ENCOUNTER — Encounter: Payer: Self-pay | Admitting: Physical Therapy

## 2017-08-11 ENCOUNTER — Ambulatory Visit: Payer: PPO | Admitting: Physical Therapy

## 2017-08-11 DIAGNOSIS — R262 Difficulty in walking, not elsewhere classified: Secondary | ICD-10-CM

## 2017-08-11 DIAGNOSIS — M6281 Muscle weakness (generalized): Secondary | ICD-10-CM

## 2017-08-11 DIAGNOSIS — E1143 Type 2 diabetes mellitus with diabetic autonomic (poly)neuropathy: Secondary | ICD-10-CM | POA: Diagnosis not present

## 2017-08-11 DIAGNOSIS — L97521 Non-pressure chronic ulcer of other part of left foot limited to breakdown of skin: Secondary | ICD-10-CM | POA: Diagnosis not present

## 2017-08-11 DIAGNOSIS — M25552 Pain in left hip: Secondary | ICD-10-CM

## 2017-08-11 NOTE — Therapy (Signed)
[place] MAIN [facility] SERVICES [address] [place], [place], [address] Phone: [phone]   Fax:  [phone]  Physical Therapy Treatment  Patient Details  Name: [name] MRN: [id number] Date of Birth: 19-Mar-1951 Referring Provider: Dr. Roland Rack   Encounter Date: 08/11/2017  PT End of Session - 08/11/17 1714    Visit Number  17    Number of Visits  34    Date for PT Re-Evaluation  08/24/17    PT Start Time  0347    PT Stop Time  0430    PT Time Calculation (min)  43 min    Equipment Utilized During Treatment  Gait belt    Activity Tolerance  Patient tolerated treatment well;Patient limited by pain;Patient limited by fatigue    Behavior During Therapy  [facility] for tasks assessed/performed       Past Medical History:  Diagnosis Date  . Abnormal antibody titer   . Anxiety and depression   . Arthritis   . Asthma   . Cystocele   . Depression   . Diabetes ([place])   . DVT (deep venous thrombosis) (HCC)    Righ calf  . Dysrhythmia   . Edema   . GERD (gastroesophageal reflux disease)   . Heart murmur    Echocardiogram June 2015: Mild MR (possible vegetation seen on TTE, not seen on TEE), normal LV size with moderate concentric LVH. Normal function EF 60-65%. Normal diastolic function. Mild LA dilation.  Marland Kitchen History of IBS   . History of methicillin resistant staphylococcus aureus (MRSA) 2008  . Hyperlipidemia   . Hypertension   . Hypothyroidism   . Insomnia   . Kidney stone    kidney stones with lithotripsy  . Neuropathy involving both lower extremities   . Obesity, Class II, BMI 35-39.9, with comorbidity   . Paroxysmal atrial fibrillation ([place]) 2007   In June 2015, Cardiac Event Monitor: Mostly SR/sinus arrhythmia with PVCs that are frequent. Short bursts of A. fib lasting several minutes;; CHA2DS2-VASc Score = 5 (age, Female, PVD, DM, HTN)  . Peripheral vascular disease ([place])   . Sleep apnea    use C-PAP  . Urinary incontinence   .  Venous stasis dermatitis of both lower extremities     Past Surgical History:  Procedure Laterality Date  . ANKLE SURGERY Right   . APPLICATION OF WOUND VAC Left 06/27/2015   Procedure: APPLICATION OF WOUND VAC ( POSSIBLE ) ;  Surgeon: [name], MD;  Location: [facility] ORS;  Service: Vascular;  Laterality: Left;  . APPLICATION OF WOUND VAC Left 09/25/2016   Procedure: APPLICATION OF WOUND VAC;  Surgeon: [name], DPM;  Location: [facility] ORS;  Service: Podiatry;  Laterality: Left;  . arm surgery     right    . CHOLECYSTECTOMY    . COLONOSCOPY    . COLONOSCOPY WITH PROPOFOL N/A 01/11/2017   Procedure: COLONOSCOPY WITH PROPOFOL;  Surgeon: [name], MD;  Location: [facility] ENDOSCOPY;  Service: Endoscopy;  Laterality: N/A;  . COLONOSCOPY WITH PROPOFOL N/A 02/22/2017   Procedure: COLONOSCOPY WITH PROPOFOL;  Surgeon: [name], MD;  Location: [facility] ENDOSCOPY;  Service: Endoscopy;  Laterality: N/A;  . COLONOSCOPY WITH PROPOFOL N/A 05/31/2017   Procedure: COLONOSCOPY WITH PROPOFOL;  Surgeon: [name], MD;  Location: [facility] ENDOSCOPY;  Service: Endoscopy;  Laterality: N/A;  . ESOPHAGOGASTRODUODENOSCOPY (EGD) WITH PROPOFOL N/A 01/11/2017   Procedure: ESOPHAGOGASTRODUODENOSCOPY (EGD) WITH PROPOFOL;  Surgeon: [name], MD;  Location: [facility]  ENDOSCOPY;  Service: Endoscopy;  Laterality: N/A;  . HERNIA REPAIR     umbilical  . HIATAL HERNIA REPAIR    . I&D EXTREMITY Left 06/27/2015   Procedure: IRRIGATION AND DEBRIDEMENT EXTREMITY            ( CALF HEMATOMA ) POSSIBLE WOUND VAC;  Surgeon: [name], MD;  Location: [facility] ORS;  Service: Vascular;  Laterality: Left;  . INCISION AND DRAINAGE OF WOUND Left 09/25/2016   Procedure: IRRIGATION AND DEBRIDEMENT - PARTIAL RESECTION OF ACHILLES TENDON WITH WOUND VAC APPLICATION;  Surgeon: [name], DPM;  Location: [facility] ORS;  Service: Podiatry;  Laterality: Left;  . IRRIGATION AND DEBRIDEMENT ABSCESS Left 09/07/2016   Procedure:  IRRIGATION AND DEBRIDEMENT ABSCESS LEFT HEEL;  Surgeon: [name], DPM;  Location: [facility] ORS;  Service: Podiatry;  Laterality: Left;  . JOINT REPLACEMENT  2013   left knee replacement  . KIDNEY SURGERY Right    kidney stones  . LITHOTRIPSY    . NM [facility] HX)  February 2017   Likely breast attenuation. LOW RISK study. Normal EF 55-60%.  . OTHER SURGICAL HISTORY     08/2016 or 09/2016 surgery on achilles tenso h/o staph infection heal. Dr. [name]  . removal of left hematoma Left    leg  . REVERSE SHOULDER ARTHROPLASTY Right 10/08/2015   Procedure: REVERSE SHOULDER ARTHROPLASTY;  Surgeon: [name], MD;  Location: [facility] ORS;  Service: Orthopedics;  Laterality: Right;  . SLEEVE GASTROPLASTY    . TONSILLECTOMY    . TOTAL KNEE ARTHROPLASTY    . TOTAL SHOULDER REPLACEMENT Right   . TRANSESOPHAGEAL ECHOCARDIOGRAM  08/08/2013   Mild LVH, EF 60-65%. Moderate LA dilation and mild RA dilation. Mild MR with no evidence of stenosis and no evidence of endocarditis. A false chordae is noted.  . TRANSTHORACIC ECHOCARDIOGRAM  08/03/2013   Mild-Moderate concentric LVH, EF 60-65%. Normal diastolic function. Mild LA dilation. Mild MR with possible vegetation  - not confirmed on TEE   . ULNAR NERVE TRANSPOSITION Right 08/06/2016   Procedure: ULNAR NERVE DECOMPRESSION/TRANSPOSITION;  Surgeon: [name], MD;  Location: [facility] ORS;  Service: Orthopedics;  Laterality: Right;  . UPPER GI ENDOSCOPY    . VAGINAL HYSTERECTOMY      There were no vitals filed for this visit.  Subjective Assessment - 08/11/17 1714    Subjective  Patient is doing well and is able to walk with sneakers on today. She is so happy with her left heel wound getting better.     Patient is accompained by:  Family member    Pertinent History  Patient presents with hx of DM, PVD, peripheral neuropathy. In June 2018 patient suffered staph infection and subsequent necrosis of L Achilles tendon requiring partial resection.  Patient has suffered three falls in last six months, including one where she felt a "pop" in the hip while getting out of the bathtub. Went to MD two days later and received x-ray, negative; diagnosed with muscle strain and was recommended exercises. ER visit on 1/28 following third fall onto L hip, diagnosed with hematoma. Patient reports reduced sensation in feet L > R. Requires assistance to get into and out of bed since tendon surgery. Patient uses spc for ambulation and reports that small steps do not cause pain.    Limitations  Walking;Sitting;Standing;Lifting;House hold activities    How long can you sit comfortably?  1 hr    How long can you stand comfortably?  n/a  How long can you walk comfortably?  40 min    Diagnostic tests  x-ray, negative, CT scan showed torn muscle    Patient Stated Goals  Improve balance, reduce fall risk, and can go somewhere without FOF    Pain Onset  1 to 4 weeks ago    Pain Onset  1 to 4 weeks ago      Treatment:  Therapeutic exercises: Supine marchin hooklying , 3 lbs with TA,20 x 2 with cues for increased hip flexion 2x10 Supine bridge with cues to stabilize pelvis 2x10 Supine SLR  X 20  BLE Supine hip abd/add with cues to keep L leg in neutral and not to ER with 3 lbs x 20  sidelying clamshell 3x5 BLE, cue to perform slowly SAQ  With 3 lbs x 20  sidelying hip abduction x 10 x 2  Gait training with rollator for 300 feet with open back shoe and getter gait speed with SBA  Patient needs moderate  cues for correct form and position of LE to perform correctly, No reports of pain and patient is able to perform exercises without rest periods.                         PT Education - 08/11/17 1714    Education provided  Yes    Education Details  HEP    Person(s) Educated  Patient    Methods  Explanation    Comprehension  Verbalized understanding       PT Short Term Goals - 08/05/17 1726      PT SHORT TERM GOAL #1   Title   Patient will be independent in home exercise program to improve strength/mobility for better functional independence with bed mobility and ADLs.    Baseline  No HEP    Time  4    Period  Weeks    Status  On-going    Target Date  08/24/17      PT SHORT TERM GOAL #2   Title  Patient will report a worst pain score of 3/10 VAS in the L hip to improve tolerance with ADLs and reduce symptoms with bed mobility activities.      Baseline  6/10 with medication; 07/06/17 4/10 right hip buttocks 4/10 buttocks pain 08/05/17    Time  4    Period  Weeks    Status  Partially Met    Target Date  08/24/17      PT SHORT TERM GOAL #3   Title  Patient will demonstrate sit to supine bed mobility, supine to sit bed mobility, and rolling L and R with minimal assistance for improved functional mobility and independence    Baseline  Patient requires mod-max A for bed mobility; independent with more time    Time  4    Period  Weeks    Status  Achieved        PT Long Term Goals - 08/05/17 1728      PT LONG TERM GOAL #1   Title  Patient (>110 years old) will improve 10-meter walk test to 0.6 meters/sec with least restrictive AD to demonstrate reduced fall risk and capacity for limited community ambulation.    Baseline  0.21 meters/sec, 05/25/17 = .21 m/sec, . 40 m/sec 06/29/17; 07/06/17;  .40 m/sec  08/05/17 . 65 m/sec    Time  8    Period  Weeks    Status  Partially Met    Target  Date  08/24/17      PT LONG TERM GOAL #2   Title  Patient will improve LEFS score to 40/80 to demonstrate improved functional mobility and independence with ADLs    Baseline  9/80; 05/25/17= 12/80, 20/80 06/29/17; 07/06/17:29/80 ; 08/05/17  34/80    Time  8    Period  Weeks    Status  On-going    Target Date  08/24/17      PT LONG TERM GOAL #3   Title  Patient will demonstrate ability to perform bed mobility independently for improved functional mobility and independence.    Baseline  Patient is independent with bed mobitiy     Time  8     Period  Weeks    Status  Achieved      PT LONG TERM GOAL #4   Title  Patient will increase six minute walk test distance to >1000 for progression to community ambulator and improve gait ability    Baseline  225 ft; 07/06/17:  08/05/17 280 ft     Time  8    Period  Weeks    Status  On-going    Target Date  08/24/17            Plan - 08/11/17 1716    Clinical Impression Statement Patient  instructed in strengthening exercises with VC assist. Patient continues to have deficits in strength of 3+/5 B hips, decreased flexibility of ankle DF LLE, gait with rollator but is now able to ambulate into the building and down to therapy gym, dynamic standing balance deficits fair + dynamic standing balance. . Patient is improving with functional tasks and is walking longer distances, and is making functional improvements with transfers and mobility. ,   Patient will benefit from continued skilled PT to improve dynamic standing balance, strength, gait, decrease pain, and reach functional goals   Rehab Potential  Fair    Clinical Impairments Affecting Rehab Potential  - comorbidities, age, severity / + family support, acute injury    PT Frequency  2x / week    PT Duration  8 weeks    PT Treatment/Interventions  Aquatic Therapy;ADLs/Self Care Home Management;Cryotherapy;Electrical Stimulation;Moist Heat;Ultrasound;DME Instruction;Gait training;Stair training;Functional mobility training;Therapeutic activities;Therapeutic exercise;Balance training;Neuromuscular re-education;Patient/family education;Manual techniques;Compression bandaging;Scar mobilization;Passive range of motion;Dry needling;Energy conservation;Taping    PT Next Visit Plan  LE strengthening    Consulted and Agree with Plan of Care  Patient       Patient will benefit from skilled therapeutic intervention in order to improve the following deficits and impairments:  Abnormal gait, Decreased activity tolerance, Decreased balance, Decreased  endurance, Decreased coordination, Decreased mobility, Decreased range of motion, Decreased safety awareness, Difficulty walking, Decreased strength, Hypomobility, Impaired perceived functional ability, Impaired UE functional use, Obesity, Impaired flexibility, Pain  Visit Diagnosis: Difficulty in walking, not elsewhere classified  Muscle weakness (generalized)  Pain in left hip     Problem List Patient Active Problem List   Diagnosis Date Noted  . Adrenal mass ([place]) 08/02/2017  . Chronic atrial fibrillation ([place]) 08/02/2017  . Eczema 06/27/2017  . Diabetic ulcer of left foot ([place]) 06/24/2017  . Mass of both adrenal glands ([place]) 05/16/2017  . Fatty liver 05/13/2017  . Pain of left hip joint 04/22/2017  . Abnormality of gait and mobility 04/22/2017  . Fall with injury 02/02/2017  . Anemia 11/18/2016  . Asthma 11/18/2016  . Carpal tunnel syndrome 11/18/2016  . Pain in joint involving ankle and foot 11/18/2016  . Obstructive sleep  apnea of adult 11/18/2016  . Spinal stenosis of thoracic region 11/18/2016  . Abscess of tendon of left foot 09/21/2016  . Cubital tunnel syndrome on right 08/07/2016  . Chronic pain syndrome 02/25/2016  . Long term current use of opiate analgesic 02/25/2016  . Chronic right shoulder pain 02/25/2016  . Status post reverse total shoulder replacement 10/08/2015  . 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 Morbid obesity with BMI of 40.0-44.9, adult ([place]) 09/23/2015  . Chronic prescription benzodiazepine use 09/23/2015  . Osteoarthritis, multiple sites 09/23/2015  . Other shoulder lesions (Right) 08/28/2015  . Cervical radiculitis 08/06/2015  . Opiate use (15 MME/Day) 01/16/2015  . Impingement syndrome of shoulder region 01/07/2015  . Mixed incontinence 11/26/2014  . Atrophic vaginitis 11/26/2014  . Anxiety and depression 09/21/2014  . Bladder cystocele 09/21/2014  . Gastro-esophageal reflux disease without esophagitis  09/21/2014  . DM (diabetes mellitus) type II, controlled, with peripheral vascular disorder ([place]) 08/31/2014  . Diabetic peripheral neuropathy associated with type 2 diabetes mellitus ([place]) 08/31/2014  . Asthma, moderate persistent, well-controlled 08/31/2014  . Hypothyroidism, adult 08/31/2014  . Peripheral vascular disease due to secondary diabetes mellitus ([place]) 12/11/2013  . Apnea, sleep 10/19/2013  . Paroxysmal atrial fibrillation ([place]); CHA2DS2-Vasc Score 5. On Pradaxa 07/27/2013  . Essential hypertension 07/27/2013    Alanson Puls, [place] DPT 08/12/2017, 9:20 AM  [place] MAIN [facility] SERVICES [address] [place], [place], [address] Phone: [phone]   Fax:  [phone]  Name: [name] MRN: [id number] Date of Birth: [birth year]

## 2017-08-12 ENCOUNTER — Ambulatory Visit
Admission: RE | Admit: 2017-08-12 | Discharge: 2017-08-12 | Disposition: A | Discharge status: Home / Self Care | Payer: PPO | Source: Ambulatory Visit | Attending: Internal Medicine | Admitting: Internal Medicine

## 2017-08-12 DIAGNOSIS — D3502 Benign neoplasm of left adrenal gland: Secondary | ICD-10-CM | POA: Insufficient documentation

## 2017-08-12 DIAGNOSIS — N200 Calculus of kidney: Secondary | ICD-10-CM | POA: Diagnosis not present

## 2017-08-12 DIAGNOSIS — D3501 Benign neoplasm of right adrenal gland: Secondary | ICD-10-CM | POA: Insufficient documentation

## 2017-08-12 DIAGNOSIS — E279 Disorder of adrenal gland, unspecified: Secondary | ICD-10-CM | POA: Insufficient documentation

## 2017-08-12 DIAGNOSIS — E278 Other specified disorders of adrenal gland: Secondary | ICD-10-CM

## 2017-08-18 ENCOUNTER — Ambulatory Visit: Payer: PPO | Admitting: Physical Therapy

## 2017-08-18 DIAGNOSIS — R262 Difficulty in walking, not elsewhere classified: Secondary | ICD-10-CM

## 2017-08-18 DIAGNOSIS — M6281 Muscle weakness (generalized): Secondary | ICD-10-CM

## 2017-08-18 DIAGNOSIS — M25552 Pain in left hip: Secondary | ICD-10-CM

## 2017-08-19 ENCOUNTER — Encounter: Payer: Self-pay | Admitting: Physical Therapy

## 2017-08-19 NOTE — Therapy (Signed)
[name] MAIN [facility] SERVICES [address] [place], [place], [address] Phone: [phone]   Fax:  [phone]  Physical Therapy Treatment  Patient Details  Name: Dawn Dawn MRN: [id number] Date of Birth: 1950-11-02 Referring Provider: Dr. Roland Rack   Encounter Date: 08/18/2017  PT End of Session - 08/19/17 1133    Visit Number  18    Number of Visits  34    Date for PT Re-Evaluation  08/24/17    PT Start Time  0345    PT Stop Time  0430    PT Time Calculation (min)  45 min    Equipment Utilized During Treatment  Gait belt    Activity Tolerance  Patient tolerated treatment well;Patient limited by pain;Patient limited by fatigue    Behavior During Therapy  [facility] for tasks assessed/performed       Past Medical History:  Diagnosis Date  . Abnormal antibody titer   . Anxiety and depression   . Arthritis   . Asthma   . Cystocele   . Depression   . Diabetes ([place])   . DVT (deep venous thrombosis) (HCC)    Righ calf  . Dysrhythmia   . Edema   . GERD (gastroesophageal reflux disease)   . Heart murmur    Echocardiogram June 2015: Mild MR (possible vegetation seen on TTE, not seen on TEE), normal LV size with moderate concentric LVH. Normal function EF 60-65%. Normal diastolic function. Mild LA dilation.  Marland Kitchen History of IBS   . History of methicillin resistant staphylococcus aureus (MRSA) 2008  . Hyperlipidemia   . Hypertension   . Hypothyroidism   . Insomnia   . Kidney stone    kidney stones with lithotripsy  . Neuropathy involving both lower extremities   . Obesity, Class II, BMI 35-39.9, with comorbidity   . Paroxysmal atrial fibrillation ([place]) 2007   In June 2015, Cardiac Event Monitor: Mostly SR/sinus arrhythmia with PVCs that are frequent. Short bursts of A. fib lasting several minutes;; CHA2DS2-VASc Score = 5 (age, Female, PVD, DM, HTN)  . Peripheral vascular disease ([place])   . Sleep apnea    use C-PAP  . Urinary incontinence   .  Venous stasis dermatitis of both lower extremities     Past Surgical History:  Procedure Laterality Date  . ANKLE SURGERY Right   . APPLICATION OF WOUND VAC Left 06/27/2015   Procedure: APPLICATION OF WOUND VAC ( POSSIBLE ) ;  Surgeon: [name], MD;  Location: [facility] ORS;  Service: Vascular;  Laterality: Left;  . APPLICATION OF WOUND VAC Left 09/25/2016   Procedure: APPLICATION OF WOUND VAC;  Surgeon: [name], DPM;  Location: [facility] ORS;  Service: Podiatry;  Laterality: Left;  . arm surgery     right    . CHOLECYSTECTOMY    . COLONOSCOPY    . COLONOSCOPY WITH PROPOFOL N/A 01/11/2017   Procedure: COLONOSCOPY WITH PROPOFOL;  Surgeon: [name], MD;  Location: [facility] ENDOSCOPY;  Service: Endoscopy;  Laterality: N/A;  . COLONOSCOPY WITH PROPOFOL N/A 02/22/2017   Procedure: COLONOSCOPY WITH PROPOFOL;  Surgeon: [name], MD;  Location: [facility] ENDOSCOPY;  Service: Endoscopy;  Laterality: N/A;  . COLONOSCOPY WITH PROPOFOL N/A 05/31/2017   Procedure: COLONOSCOPY WITH PROPOFOL;  Surgeon: [name], MD;  Location: [facility] ENDOSCOPY;  Service: Endoscopy;  Laterality: N/A;  . ESOPHAGOGASTRODUODENOSCOPY (EGD) WITH PROPOFOL N/A 01/11/2017   Procedure: ESOPHAGOGASTRODUODENOSCOPY (EGD) WITH PROPOFOL;  Surgeon: [name], MD;  Location: [facility]  ENDOSCOPY;  Service: Endoscopy;  Laterality: N/A;  . HERNIA REPAIR     umbilical  . HIATAL HERNIA REPAIR    . I&D EXTREMITY Left 06/27/2015   Procedure: IRRIGATION AND DEBRIDEMENT EXTREMITY            ( CALF HEMATOMA ) POSSIBLE WOUND VAC;  Surgeon: [name], MD;  Location: [facility] ORS;  Service: Vascular;  Laterality: Left;  . INCISION AND DRAINAGE OF WOUND Left 09/25/2016   Procedure: IRRIGATION AND DEBRIDEMENT - PARTIAL RESECTION OF ACHILLES TENDON WITH WOUND VAC APPLICATION;  Surgeon: [name], DPM;  Location: [facility] ORS;  Service: Podiatry;  Laterality: Left;  . IRRIGATION AND DEBRIDEMENT ABSCESS Left 09/07/2016   Procedure:  IRRIGATION AND DEBRIDEMENT ABSCESS LEFT HEEL;  Surgeon: [name], DPM;  Location: [facility] ORS;  Service: Podiatry;  Laterality: Left;  . JOINT REPLACEMENT  2013   left knee replacement  . KIDNEY SURGERY Right    kidney stones  . LITHOTRIPSY    . NM [facility] HX)  February 2017   Likely breast attenuation. LOW RISK study. Normal EF 55-60%.  . OTHER SURGICAL HISTORY     08/2016 or 09/2016 surgery on achilles tenso h/o staph infection heal. Dr. [name]  . removal of left hematoma Left    leg  . REVERSE SHOULDER ARTHROPLASTY Right 10/08/2015   Procedure: REVERSE SHOULDER ARTHROPLASTY;  Surgeon: [name], MD;  Location: [facility] ORS;  Service: Orthopedics;  Laterality: Right;  . SLEEVE GASTROPLASTY    . TONSILLECTOMY    . TOTAL KNEE ARTHROPLASTY    . TOTAL SHOULDER REPLACEMENT Right   . TRANSESOPHAGEAL ECHOCARDIOGRAM  08/08/2013   Mild LVH, EF 60-65%. Moderate LA dilation and mild RA dilation. Mild MR with no evidence of stenosis and no evidence of endocarditis. A false chordae is noted.  . TRANSTHORACIC ECHOCARDIOGRAM  08/03/2013   Mild-Moderate concentric LVH, EF 60-65%. Normal diastolic function. Mild LA dilation. Mild MR with possible vegetation  - not confirmed on TEE   . ULNAR NERVE TRANSPOSITION Right 08/06/2016   Procedure: ULNAR NERVE DECOMPRESSION/TRANSPOSITION;  Surgeon: [name], MD;  Location: [facility] ORS;  Service: Orthopedics;  Laterality: Right;  . UPPER GI ENDOSCOPY    . VAGINAL HYSTERECTOMY      There were no vitals filed for this visit.    Therapeutic exercises: Supine marchin hooklying with TA, 4 lbs20 x 2 with cues for increased hip flexion 2x10 Supine bridge with cues to stabilize pelvis 2x10 Supine SLR  X 20  BLE supine heel slides 4 lbs x 20 BLE Supine hip abd/add with cues to keep L leg in neutral and not to ER with 4 lbs x 20  sidelying clamshell 3x5 BLE, cue to perform slowly SAQ  With 4 lbs x 20  Hip abd BLE x 10 x 2  Gait training  with rollator for 300 feet with open back shoe and getter gait speed with SBA  Patient needs moderat  cues for correct form and position of LE to perform correctly                        PT Education - 08/19/17 1133    Education provided  Yes    Education Details  HEP    Person(s) Educated  Patient    Methods  Explanation    Comprehension  Verbalized understanding       PT Short Term Goals - 08/05/17 1726  PT SHORT TERM GOAL #1   Title  Patient will be independent in home exercise program to improve strength/mobility for better functional independence with bed mobility and ADLs.    Baseline  No HEP    Time  4    Period  Weeks    Status  On-going    Target Date  08/24/17      PT SHORT TERM GOAL #2   Title  Patient will report a worst pain score of 3/10 VAS in the L hip to improve tolerance with ADLs and reduce symptoms with bed mobility activities.      Baseline  6/10 with medication; 07/06/17 4/10 right hip buttocks 4/10 buttocks pain 08/05/17    Time  4    Period  Weeks    Status  Partially Met    Target Date  08/24/17      PT SHORT TERM GOAL #3   Title  Patient will demonstrate sit to supine bed mobility, supine to sit bed mobility, and rolling L and R with minimal assistance for improved functional mobility and independence    Baseline  Patient requires mod-max A for bed mobility; independent with more time    Time  4    Period  Weeks    Status  Achieved        PT Long Term Goals - 08/05/17 1728      PT LONG TERM GOAL #1   Title  Patient (>49 years old) will improve 10-meter walk test to 0.6 meters/sec with least restrictive AD to demonstrate reduced fall risk and capacity for limited community ambulation.    Baseline  0.21 meters/sec, 05/25/17 = .21 m/sec, . 40 m/sec 06/29/17; 07/06/17;  .40 m/sec  08/05/17 . 65 m/sec    Time  8    Period  Weeks    Status  Partially Met    Target Date  08/24/17      PT LONG TERM GOAL #2   Title  Patient will  improve LEFS score to 40/80 to demonstrate improved functional mobility and independence with ADLs    Baseline  9/80; 05/25/17= 12/80, 20/80 06/29/17; 07/06/17:29/80 ; 08/05/17  34/80    Time  8    Period  Weeks    Status  On-going    Target Date  08/24/17      PT LONG TERM GOAL #3   Title  Patient will demonstrate ability to perform bed mobility independently for improved functional mobility and independence.    Baseline  Patient is independent with bed mobitiy     Time  8    Period  Weeks    Status  Achieved      PT LONG TERM GOAL #4   Title  Patient will increase six minute walk test distance to >1000 for progression to community ambulator and improve gait ability    Baseline  225 ft; 07/06/17:  08/05/17 280 ft     Time  8    Period  Weeks    Status  On-going    Target Date  08/24/17            Plan - 08/19/17 1134    Clinical Impression Statement  Instructed patient in advanced LE strengthening and balance exercise; Patient requires min Vcs for correct exercise technique including to slow down LE movement and increase ROM for better strengthening; Patient demonstrates better sit to stand transfer and bed mobiltiy. Patient continues to have trouble with reaching outside of base of support .  Patient would benefit from additional skilled PT Intervention to improve LE strength, balance and gait safety and return to PLOF.    Rehab Potential  Fair    Clinical Impairments Affecting Rehab Potential  - comorbidities, age, severity / + family support, acute injury    PT Frequency  2x / week    PT Duration  8 weeks    PT Treatment/Interventions  Aquatic Therapy;ADLs/Self Care Home Management;Cryotherapy;Electrical Stimulation;Moist Heat;Ultrasound;DME Instruction;Gait training;Stair training;Functional mobility training;Therapeutic activities;Therapeutic exercise;Balance training;Neuromuscular re-education;Patient/family education;Manual techniques;Compression bandaging;Scar mobilization;Passive  range of motion;Dry needling;Energy conservation;Taping    PT Next Visit Plan  LE strengthening    Consulted and Agree with Plan of Care  Patient       Patient will benefit from skilled therapeutic intervention in order to improve the following deficits and impairments:  Abnormal gait, Decreased activity tolerance, Decreased balance, Decreased endurance, Decreased coordination, Decreased mobility, Decreased range of motion, Decreased safety awareness, Difficulty walking, Decreased strength, Hypomobility, Impaired perceived functional ability, Impaired UE functional use, Obesity, Impaired flexibility, Pain  Visit Diagnosis: Difficulty in walking, not elsewhere classified  Muscle weakness (generalized)  Pain in left hip     Problem List Patient Active Problem List   Diagnosis Date Noted  . Adrenal mass ([place]) 08/02/2017  . Chronic atrial fibrillation ([place]) 08/02/2017  . Eczema 06/27/2017  . Diabetic ulcer of left foot ([place]) 06/24/2017  . Mass of both adrenal glands ([place]) 05/16/2017  . Fatty liver 05/13/2017  . Pain of left hip joint 04/22/2017  . Abnormality of gait and mobility 04/22/2017  . Fall with injury 02/02/2017  . Anemia 11/18/2016  . Asthma 11/18/2016  . Carpal tunnel syndrome 11/18/2016  . Pain in joint involving ankle and foot 11/18/2016  . Obstructive sleep apnea of adult 11/18/2016  . Spinal stenosis of thoracic region 11/18/2016  . Abscess of tendon of left foot 09/21/2016  . Cubital tunnel syndrome on right 08/07/2016  . Chronic pain syndrome 02/25/2016  . Long term current use of opiate analgesic 02/25/2016  . Chronic right shoulder pain 02/25/2016  . Status post reverse total shoulder replacement 10/08/2015  . 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 Morbid obesity with BMI of 40.0-44.9, adult ([place]) 09/23/2015  . Chronic prescription benzodiazepine use 09/23/2015  . Osteoarthritis, multiple sites 09/23/2015  . Other shoulder  lesions (Right) 08/28/2015  . Cervical radiculitis 08/06/2015  . Opiate use (15 MME/Day) 01/16/2015  . Impingement syndrome of shoulder region 01/07/2015  . Mixed incontinence 11/26/2014  . Atrophic vaginitis 11/26/2014  . Anxiety and depression 09/21/2014  . Bladder cystocele 09/21/2014  . Gastro-esophageal reflux disease without esophagitis 09/21/2014  . DM (diabetes mellitus) type II, controlled, with peripheral vascular disorder ([place]) 08/31/2014  . Diabetic peripheral neuropathy associated with type 2 diabetes mellitus ([place]) 08/31/2014  . Asthma, moderate persistent, well-controlled 08/31/2014  . Hypothyroidism, adult 08/31/2014  . Peripheral vascular disease due to secondary diabetes mellitus ([place]) 12/11/2013  . Apnea, sleep 10/19/2013  . Paroxysmal atrial fibrillation ([place]); CHA2DS2-Vasc Score 5. On Pradaxa 07/27/2013  . Essential hypertension 07/27/2013    Alanson Puls, [place] DPT 08/19/2017, 11:35 AM  [place] MAIN [facility] SERVICES [address] [place], [place], [address] Phone: [phone]   Fax:  [phone]  Name: Dawn Dawn MRN: [id number] Date of Birth: 11-30-1950

## 2017-08-24 ENCOUNTER — Ambulatory Visit: Payer: PPO | Attending: Nurse Practitioner | Admitting: Nurse Practitioner

## 2017-08-24 ENCOUNTER — Other Ambulatory Visit: Payer: Self-pay

## 2017-08-24 ENCOUNTER — Encounter: Payer: Self-pay | Admitting: Nurse Practitioner

## 2017-08-24 VITALS — BP 147/57 | HR 55 | Temp 97.6°F | Resp 16 | Ht 65.5 in | Wt 254.0 lb

## 2017-08-24 DIAGNOSIS — Z8249 Family history of ischemic heart disease and other diseases of the circulatory system: Secondary | ICD-10-CM | POA: Insufficient documentation

## 2017-08-24 DIAGNOSIS — M79604 Pain in right leg: Secondary | ICD-10-CM | POA: Diagnosis present

## 2017-08-24 DIAGNOSIS — F419 Anxiety disorder, unspecified: Secondary | ICD-10-CM | POA: Insufficient documentation

## 2017-08-24 DIAGNOSIS — K760 Fatty (change of) liver, not elsewhere classified: Secondary | ICD-10-CM | POA: Insufficient documentation

## 2017-08-24 DIAGNOSIS — Z79891 Long term (current) use of opiate analgesic: Secondary | ICD-10-CM | POA: Diagnosis not present

## 2017-08-24 DIAGNOSIS — Z7901 Long term (current) use of anticoagulants: Secondary | ICD-10-CM | POA: Diagnosis not present

## 2017-08-24 DIAGNOSIS — G4733 Obstructive sleep apnea (adult) (pediatric): Secondary | ICD-10-CM | POA: Insufficient documentation

## 2017-08-24 DIAGNOSIS — G894 Chronic pain syndrome: Secondary | ICD-10-CM | POA: Insufficient documentation

## 2017-08-24 DIAGNOSIS — Z841 Family history of disorders of kidney and ureter: Secondary | ICD-10-CM | POA: Insufficient documentation

## 2017-08-24 DIAGNOSIS — E039 Hypothyroidism, unspecified: Secondary | ICD-10-CM | POA: Insufficient documentation

## 2017-08-24 DIAGNOSIS — K589 Irritable bowel syndrome without diarrhea: Secondary | ICD-10-CM | POA: Diagnosis not present

## 2017-08-24 DIAGNOSIS — G8929 Other chronic pain: Secondary | ICD-10-CM

## 2017-08-24 DIAGNOSIS — Z86718 Personal history of other venous thrombosis and embolism: Secondary | ICD-10-CM | POA: Diagnosis not present

## 2017-08-24 DIAGNOSIS — E785 Hyperlipidemia, unspecified: Secondary | ICD-10-CM | POA: Insufficient documentation

## 2017-08-24 DIAGNOSIS — Z7984 Long term (current) use of oral hypoglycemic drugs: Secondary | ICD-10-CM | POA: Diagnosis not present

## 2017-08-24 DIAGNOSIS — Z886 Allergy status to analgesic agent status: Secondary | ICD-10-CM | POA: Insufficient documentation

## 2017-08-24 DIAGNOSIS — Z87442 Personal history of urinary calculi: Secondary | ICD-10-CM | POA: Insufficient documentation

## 2017-08-24 DIAGNOSIS — E1151 Type 2 diabetes mellitus with diabetic peripheral angiopathy without gangrene: Secondary | ICD-10-CM | POA: Diagnosis not present

## 2017-08-24 DIAGNOSIS — S76012A Strain of muscle, fascia and tendon of left hip, initial encounter: Secondary | ICD-10-CM | POA: Insufficient documentation

## 2017-08-24 DIAGNOSIS — I872 Venous insufficiency (chronic) (peripheral): Secondary | ICD-10-CM | POA: Insufficient documentation

## 2017-08-24 DIAGNOSIS — M25572 Pain in left ankle and joints of left foot: Secondary | ICD-10-CM | POA: Diagnosis not present

## 2017-08-24 DIAGNOSIS — E1143 Type 2 diabetes mellitus with diabetic autonomic (poly)neuropathy: Secondary | ICD-10-CM | POA: Diagnosis not present

## 2017-08-24 DIAGNOSIS — M5412 Radiculopathy, cervical region: Secondary | ICD-10-CM | POA: Insufficient documentation

## 2017-08-24 DIAGNOSIS — Z79890 Hormone replacement therapy (postmenopausal): Secondary | ICD-10-CM | POA: Diagnosis not present

## 2017-08-24 DIAGNOSIS — Z7951 Long term (current) use of inhaled steroids: Secondary | ICD-10-CM | POA: Insufficient documentation

## 2017-08-24 DIAGNOSIS — F329 Major depressive disorder, single episode, unspecified: Secondary | ICD-10-CM | POA: Insufficient documentation

## 2017-08-24 DIAGNOSIS — I10 Essential (primary) hypertension: Secondary | ICD-10-CM | POA: Insufficient documentation

## 2017-08-24 DIAGNOSIS — L97521 Non-pressure chronic ulcer of other part of left foot limited to breakdown of skin: Secondary | ICD-10-CM | POA: Diagnosis not present

## 2017-08-24 DIAGNOSIS — Z9049 Acquired absence of other specified parts of digestive tract: Secondary | ICD-10-CM | POA: Insufficient documentation

## 2017-08-24 DIAGNOSIS — Z96611 Presence of right artificial shoulder joint: Secondary | ICD-10-CM | POA: Insufficient documentation

## 2017-08-24 DIAGNOSIS — E1351 Other specified diabetes mellitus with diabetic peripheral angiopathy without gangrene: Secondary | ICD-10-CM

## 2017-08-24 DIAGNOSIS — R32 Unspecified urinary incontinence: Secondary | ICD-10-CM | POA: Insufficient documentation

## 2017-08-24 DIAGNOSIS — Z880 Allergy status to penicillin: Secondary | ICD-10-CM | POA: Insufficient documentation

## 2017-08-24 DIAGNOSIS — R269 Unspecified abnormalities of gait and mobility: Secondary | ICD-10-CM | POA: Insufficient documentation

## 2017-08-24 DIAGNOSIS — D649 Anemia, unspecified: Secondary | ICD-10-CM | POA: Insufficient documentation

## 2017-08-24 DIAGNOSIS — Z90710 Acquired absence of both cervix and uterus: Secondary | ICD-10-CM | POA: Insufficient documentation

## 2017-08-24 DIAGNOSIS — M4804 Spinal stenosis, thoracic region: Secondary | ICD-10-CM | POA: Insufficient documentation

## 2017-08-24 DIAGNOSIS — Z8261 Family history of arthritis: Secondary | ICD-10-CM | POA: Insufficient documentation

## 2017-08-24 DIAGNOSIS — E1142 Type 2 diabetes mellitus with diabetic polyneuropathy: Secondary | ICD-10-CM | POA: Diagnosis not present

## 2017-08-24 DIAGNOSIS — I480 Paroxysmal atrial fibrillation: Secondary | ICD-10-CM | POA: Insufficient documentation

## 2017-08-24 DIAGNOSIS — Z8614 Personal history of Methicillin resistant Staphylococcus aureus infection: Secondary | ICD-10-CM | POA: Insufficient documentation

## 2017-08-24 DIAGNOSIS — Z811 Family history of alcohol abuse and dependence: Secondary | ICD-10-CM | POA: Insufficient documentation

## 2017-08-24 DIAGNOSIS — Z881 Allergy status to other antibiotic agents status: Secondary | ICD-10-CM | POA: Insufficient documentation

## 2017-08-24 DIAGNOSIS — Z79899 Other long term (current) drug therapy: Secondary | ICD-10-CM | POA: Insufficient documentation

## 2017-08-24 DIAGNOSIS — M25511 Pain in right shoulder: Secondary | ICD-10-CM | POA: Diagnosis not present

## 2017-08-24 DIAGNOSIS — Z6841 Body Mass Index (BMI) 40.0 and over, adult: Secondary | ICD-10-CM | POA: Insufficient documentation

## 2017-08-24 DIAGNOSIS — Z885 Allergy status to narcotic agent status: Secondary | ICD-10-CM | POA: Insufficient documentation

## 2017-08-24 DIAGNOSIS — Z818 Family history of other mental and behavioral disorders: Secondary | ICD-10-CM | POA: Insufficient documentation

## 2017-08-24 DIAGNOSIS — S76011A Strain of muscle, fascia and tendon of right hip, initial encounter: Secondary | ICD-10-CM | POA: Insufficient documentation

## 2017-08-24 DIAGNOSIS — K219 Gastro-esophageal reflux disease without esophagitis: Secondary | ICD-10-CM | POA: Diagnosis not present

## 2017-08-24 DIAGNOSIS — Z9884 Bariatric surgery status: Secondary | ICD-10-CM | POA: Insufficient documentation

## 2017-08-24 DIAGNOSIS — X58XXXA Exposure to other specified factors, initial encounter: Secondary | ICD-10-CM | POA: Insufficient documentation

## 2017-08-24 DIAGNOSIS — Z808 Family history of malignant neoplasm of other organs or systems: Secondary | ICD-10-CM | POA: Insufficient documentation

## 2017-08-24 MED ORDER — GABAPENTIN 800 MG PO TABS: 800.0000 mg | ORAL_TABLET | Freq: Four times a day (QID) | ORAL | 0 refills | Status: DC

## 2017-08-24 MED ORDER — HYDROCODONE-ACETAMINOPHEN 5-325 MG PO TABS: 1.0000 | ORAL_TABLET | Freq: Three times a day (TID) | ORAL | 0 refills | Status: DC | PRN

## 2017-08-24 NOTE — Patient Instructions (Signed)
____________________________________________________________________________________________  Medication Rules  Applies to: All patients receiving prescriptions (written or electronic).  Pharmacy of record: Pharmacy where electronic prescriptions will be sent. If written prescriptions are taken to a different pharmacy, please inform the nursing staff. The pharmacy listed in the electronic medical record should be the one where you would like electronic prescriptions to be sent.  Prescription refills: Only during scheduled appointments. Applies to both, written and electronic prescriptions.  NOTE: The following applies primarily to controlled substances (Opioid* Pain Medications).   Patient's responsibilities: 1. Pain Pills: Bring all pain pills to every appointment (except for procedure appointments). 2. Pill Bottles: Bring pills in original pharmacy bottle. Always bring newest bottle. Bring bottle, even if empty. 3. Medication refills: You are responsible for knowing and keeping track of what medications you need refilled. The day before your appointment, write a list of all prescriptions that need to be refilled. Bring that list to your appointment and give it to the admitting nurse. Prescriptions will be written only during appointments. If you forget a medication, it will not be "Called in", "Faxed", or "electronically sent". You will need to get another appointment to get these prescribed. 4. Prescription Accuracy: You are responsible for carefully inspecting your prescriptions before leaving our office. Have the discharge nurse carefully go over each prescription with you, before taking them home. Make sure that your name is accurately spelled, that your address is correct. Check the name and dose of your medication to make sure it is accurate. Check the number of pills, and the written instructions to make sure they are clear and accurate. Make sure that you are given enough medication to last  until your next medication refill appointment. 5. Taking Medication: Take medication as prescribed. Never take more pills than instructed. Never take medication more frequently than prescribed. Taking less pills or less frequently is permitted and encouraged, when it comes to controlled substances (written prescriptions).  6. Inform other Doctors: Always inform, all of your healthcare providers, of all the medications you take. 7. Pain Medication from other Providers: You are not allowed to accept any additional pain medication from any other Doctor or Healthcare provider. There are two exceptions to this rule. (see below) In the event that you require additional pain medication, you are responsible for notifying us, as stated below. 8. Medication Agreement: You are responsible for carefully reading and following our Medication Agreement. This must be signed before receiving any prescriptions from our practice. Safely store a copy of your signed Agreement. Violations to the Agreement will result in no further prescriptions. (Additional copies of our Medication Agreement are available upon request.) 9. Laws, Rules, & Regulations: All patients are expected to follow all Federal and State Laws, Statutes, Rules, & Regulations. Ignorance of the Laws does not constitute a valid excuse. The use of any illegal substances is prohibited. 10. Adopted CDC guidelines & recommendations: Target dosing levels will be at or below 60 MME/day. Use of benzodiazepines** is not recommended.  Exceptions: There are only two exceptions to the rule of not receiving pain medications from other Healthcare Providers. 1. Exception #1 (Emergencies): In the event of an emergency (i.e.: accident requiring emergency care), you are allowed to receive additional pain medication. However, you are responsible for: As soon as you are able, call our office [phone], at any time of the day or night, and leave a message stating your name, the  date and nature of the emergency, and the name and dose of the medication   prescribed. In the event that your call is answered by a member of our staff, make sure to document and save the date, time, and the name of the person that took your information.  2. Exception #2 (Planned Surgery): In the event that you are scheduled by another doctor or dentist to have any type of surgery or procedure, you are allowed (for a period no longer than 30 days), to receive additional pain medication, for the acute post-op pain. However, in this case, you are responsible for picking up a copy of our "Post-op Pain Management for Surgeons" handout, and giving it to your surgeon or dentist. This document is available at our office, and does not require an appointment to obtain it. Simply go to our office during business hours (Monday-Thursday from 8:00 AM to 4:00 PM) (Friday 8:00 AM to 12:00 Noon) or if you have a scheduled appointment with [place], prior to your surgery, and ask for it by name. In addition, you will need to provide [place] with your name, name of your surgeon, type of surgery, and date of procedure or surgery.  *Opioid medications include: morphine, codeine, oxycodone, oxymorphone, hydrocodone, hydromorphone, meperidine, tramadol, tapentadol, buprenorphine, fentanyl, methadone. **Benzodiazepine medications include: diazepam (Valium), alprazolam (Xanax), clonazepam (Klonopine), lorazepam (Ativan), clorazepate (Tranxene), chlordiazepoxide (Librium), estazolam (Prosom), oxazepam (Serax), temazepam (Restoril), triazolam (Halcion) (Last updated: 05/20/2017) ____________________________________________________________________________________________    BMI Assessment: Estimated body mass index is 41.62 kg/m as calculated from the following:   Height as of this encounter: 5' 5.5" (1.664 m).   Weight as of this encounter: 254 lb (115.2 kg).  BMI interpretation table: BMI level Category Range association with higher  incidence of chronic pain  <18 kg/m2 Underweight   18.5-24.9 kg/m2 Ideal body weight   25-29.9 kg/m2 Overweight Increased incidence by 20%  30-34.9 kg/m2 Obese (Class I) Increased incidence by 68%  35-39.9 kg/m2 Severe obesity (Class II) Increased incidence by 136%  >40 kg/m2 Extreme obesity (Class III) Increased incidence by 254%   Patient's current BMI Ideal Body weight  Body mass index is 41.62 kg/m. Ideal body weight: 58.2 kg (128 lb 3.2 oz) Adjusted ideal body weight: 81 kg (178 lb 8.3 oz)   BMI Readings from Last 4 Encounters:  08/24/17 41.62 kg/m  08/02/17 43.90 kg/m  06/24/17 42.33 kg/m  06/08/17 42.43 kg/m   Wt Readings from Last 4 Encounters:  08/24/17 254 lb (115.2 kg)  08/02/17 263 lb 12.8 oz (119.7 kg)  06/24/17 254 lb 6.4 oz (115.4 kg)  06/08/17 255 lb (115.7 kg)

## 2017-08-24 NOTE — Progress Notes (Signed)
Patient's Name: [name]  MRN: [id number]  Referring Provider: Orland Mustard *  DOB: 1950-11-25  PCP: [name], [name], MD  DOS: 08/24/2017  Note by: Vevelyn Francois NP  Service setting: Ambulatory outpatient  Specialty: Interventional Pain Management  Location: [facility] (AMB) Pain Management Facility    Patient type: Established    Primary Reason(s) for Visit: Encounter for prescription drug management. (Level of risk: moderate)  CC: Leg Pain (bilateral)  HPI  [name] is a 67 y.o. year old, female patient, who comes today for a medication management evaluation. She has Paroxysmal atrial fibrillation ([place]); CHA2DS2-Vasc Score 5. On Pradaxa; Essential hypertension; Peripheral vascular disease due to secondary diabetes mellitus ([place]); Apnea, sleep; DM (diabetes mellitus) type II, controlled, with peripheral vascular disorder ([place]); Diabetic peripheral neuropathy associated with type 2 diabetes mellitus ([place]); Asthma, moderate persistent, well-controlled; Hypothyroidism, adult; Anxiety and depression; Bladder cystocele; Gastro-esophageal reflux disease without esophagitis; Mixed incontinence; Atrophic vaginitis; Opiate use (15 MME/Day); Other shoulder lesions (Right); Neuropathy of right lateral femoral cutaneous nerve; Morbid obesity with BMI of 40.0-44.9, adult ([place]); Chronic prescription benzodiazepine use; Osteoarthritis, multiple sites; Status post reverse total shoulder replacement; Chronic pain syndrome; Long term current use of opiate analgesic; Chronic right shoulder pain; Abscess of tendon of left foot; Anemia; Asthma; Carpal tunnel syndrome; Cervical radiculitis; Cubital tunnel syndrome on right; Impingement syndrome of shoulder region; Pain in joint involving ankle and foot; Obstructive sleep apnea of adult; Spinal stenosis of thoracic region; Fall with injury; Pain of left hip joint; Abnormality of gait and mobility; Mass of both adrenal glands ([place]); Fatty liver; Diabetic  ulcer of left foot ([place]); Eczema; Adrenal mass ([place]); Chronic atrial fibrillation ([place]); Muscle strain of left hip; Strain of right hip; and Ischial pain, right on their problem list. Her primarily concern today is the Leg Pain (bilateral)  Pain Assessment: Location: Right, Left Buttocks Radiating: hips/buttocks, down legs to bottom of foot Onset: More than a month ago Duration: Chronic pain Quality: Aching, Discomfort, Sharp, Stabbing Severity: 8 /10 (subjective, self-reported pain score)  Note: Reported level is compatible with observation. Clinically the patient looks like a 3/10 A 3/10 is viewed as "Moderate" and described as significantly interfering with activities of daily living (ADL). It becomes difficult to feed, bathe, get dressed, get on and off the toilet or to perform personal hygiene functions. Difficult to get in and out of bed or a chair without assistance. Very distracting. With effort, it can be ignored when deeply involved in activities. Discrepancy may suggest a "suffering" (psychosocial) component. When using our objective Pain Scale, levels between 6 and 10/10 are said to belong in an emergency room, as it progressively worsens from a 6/10, described as severely limiting, requiring emergency care not usually available at an outpatient pain management facility. At a 6/10 level, communication becomes difficult and requires great effort. Assistance to reach the emergency department may be required. Facial flushing and profuse sweating along with potentially dangerous increases in heart rate and blood pressure will be evident. Effect on ADL: prolonged walking Timing: Constant Modifying factors: meds, rest BP: (!) 147/57  HR: (!) 55  [name] was last scheduled for an appointment on 05/24/2017 for medication management. During today's appointment we reviewed [name]'s chronic pain status, as well as her outpatient medication regimen. She admits that the PT is helping her but  not her balance. She admits that she is able to get into her kitchen in cook her meals.   The patient  reports that  she does not use drugs. Her body mass index is 41.62 kg/m.  Further details on both, my assessment(s), as well as the proposed treatment plan, please see below.  Controlled Substance Pharmacotherapy Assessment REMS (Risk Evaluation and Mitigation Strategy)  Analgesic:Hydrocodone/APAP 5/325 one tablet every 8 hours (15 mg/day) MME/day:15 mg/day    Ignatius Specking, RN  08/24/2017  1:29 PM  Sign at close encounter Nursing Pain Medication Assessment:  Safety precautions to be maintained throughout the outpatient stay will include: orient to surroundings, keep bed in low position, maintain call bell within reach at all times, provide assistance with transfer out of bed and ambulation.  Medication Inspection Compliance: Pill count conducted under aseptic conditions, in front of the patient. Neither the pills nor the bottle was removed from the patient's sight at any time. Once count was completed pills were immediately returned to the patient in their original bottle.  Medication: See above Pill/Patch Count: 51 of 90 pills remain Pill/Patch Appearance: Markings consistent with prescribed medication Bottle Appearance: Standard pharmacy container. Clearly labeled. Filled Date: 5 / 22 / 2019 Last Medication intake:  Today   Pharmacokinetics: Liberation and absorption (onset of action): WNL Distribution (time to peak effect): WNL Metabolism and excretion (duration of action): WNL         Pharmacodynamics: Desired effects: Analgesia: [name] reports >50% benefit. Functional ability: Patient reports that medication allows her to accomplish basic ADLs Clinically meaningful improvement in function (CMIF): Sustained CMIF goals met Perceived effectiveness: Described as relatively effective, allowing for increase in activities of daily living (ADL) Undesirable  effects: Side-effects or Adverse reactions: None reported Monitoring: [place] PMP: Online review of the past 38-monthperiod conducted. Compliant with practice rules and regulations Last UDS on record: No results found for: SUMMARY UDS interpretation: Compliant          Medication Assessment Form: Reviewed. Patient indicates being compliant with therapy Treatment compliance: Compliant Risk Assessment Profile: Aberrant behavior: See prior evaluations. None observed or detected today Comorbid factors increasing risk of overdose: See prior notes. No additional risks detected today Risk of substance use disorder (SUD): Low Opioid Risk Tool - 08/24/17 1322      Personal History of Substance Abuse   Alcohol  Negative    Illegal Drugs  Negative    Rx Drugs  Negative      Psychological Disease   Psychological Disease  Positive    ADD  Negative    OCD  Negative    Bipolar  Negative    Schizophrenia  Negative    Depression  Positive      Total Score   Opioid Risk Tool Scoring  3    Opioid Risk Interpretation  Low Risk      ORT Scoring interpretation table:  Score <3 = Low Risk for SUD  Score between 4-7 = Moderate Risk for SUD  Score >8 = High Risk for Opioid Abuse   Risk Mitigation Strategies:  Patient Counseling: Covered Patient-Prescriber Agreement (PPA): Present and active  Notification to other healthcare providers: Done  Pharmacologic Plan: No change in therapy, at this time.             Laboratory Chemistry  Inflammation Markers (CRP: Acute Phase) (ESR: Chronic Phase) Lab Results  Component Value Date   CRP 14.7 (H) 09/08/2016   ESRSEDRATE 58 (H) 09/08/2016                         Rheumatology Markers No  results found for: RF, ANA, LABURIC, URICUR, LYMEIGGIGMAB, LYMEABIGMQN, HLAB27                      Renal Function Markers Lab Results  Component Value Date   BUN 20 06/24/2017   CREATININE 0.82 06/24/2017   BCR 20 10/10/2014   GFRAA >60 01/07/2017   GFRNONAA  >60 01/07/2017                              Hepatic Function Markers Lab Results  Component Value Date   AST 12 05/10/2017   ALT 9 05/10/2017   ALBUMIN 3.9 05/10/2017   ALKPHOS 64 05/10/2017   LIPASE 120 08/12/2012                        Electrolytes Lab Results  Component Value Date   NA 141 06/24/2017   K 4.5 06/24/2017   CL 104 06/24/2017   CALCIUM 9.1 06/24/2017   MG 1.8 04/17/2015   PHOS 3.4 08/12/2012                        Neuropathy Markers Lab Results  Component Value Date   VITAMINB12 502 05/10/2017   HGBA1C 6.4 05/10/2017                        Bone Pathology Markers Lab Results  Component Value Date   VD25OH 32.15 05/10/2017                         Coagulation Parameters Lab Results  Component Value Date   INR 1.18 09/26/2015   LABPROT 15.2 (H) 09/26/2015   APTT 42 (H) 06/24/2015   PLT 271.0 05/10/2017                        Cardiovascular Markers Lab Results  Component Value Date   BNP 216.0 (H) 09/30/2016   TROPONINI <0.03 09/30/2016   HGB 10.9 (L) 05/10/2017   HCT 33.9 (L) 05/10/2017                         CA Markers No results found for: CEA, CA125, LABCA2                      Note: Lab results reviewed.  Recent Diagnostic Imaging Results  CT ADRENAL ABD WO CLINICAL DATA:  Adrenal mass.  EXAM: CT ABDOMEN WITHOUT CONTRAST  TECHNIQUE: Multidetector CT imaging of the abdomen was performed following the standard protocol without IV contrast.  COMPARISON:  CT scan of August 23, 2012.  MRI of December 14, 2014.  FINDINGS: Lower chest: No acute abnormality.  Hepatobiliary: No focal liver abnormality is seen. Status post cholecystectomy. No biliary dilatation.  Pancreas: Unremarkable. No pancreatic ductal dilatation or surrounding inflammatory changes.  Spleen: Normal in size without focal abnormality.  Adrenals/Urinary Tract: Stable bilateral adrenal masses are noted with macroscopic fat present consistent with myelolipomas.  The right lesion measures 7.6 cm, and the left lesion measures 9.8 cm. Bilateral renal cortical scarring is noted. Nonobstructive left renal calculus is noted. No hydronephrosis or renal obstruction is noted.  Stomach/Bowel: Status post gastric surgery. No definite abnormality seen involving the visualized large and small bowel. Visualized portion of appendix appears normal.  Vascular/Lymphatic:  No significant vascular findings are present. No enlarged abdominal or pelvic lymph nodes.  Other: No abdominal wall hernia or abnormality.  Musculoskeletal: Multilevel degenerative disc disease is noted in the lumbar spine. No acute osseous abnormality is noted.  IMPRESSION: Stable bilateral adrenal myelolipomas are noted. No further follow-up is required.  Nonobstructive left renal calculus is noted. No hydronephrosis or renal obstruction is noted. Bilateral renal cortical scarring is noted.  Electronically Signed   By: Marijo Conception, M.D.   On: 08/12/2017 13:05  Complexity Note: Imaging results reviewed. Results shared with [name], using Layman's terms.                         Meds   Current Outpatient Medications:  .  albuterol (PROVENTIL HFA;VENTOLIN HFA) 108 (90 Base) MCG/ACT inhaler, Inhale 1-2 puffs into the lungs every 4 (four) hours as needed for wheezing or shortness of breath. (Patient taking differently: Inhale 2 puffs into the lungs every 4 (four) hours as needed for wheezing or shortness of breath. ), Disp: 8 g, Rfl: 2 .  albuterol (PROVENTIL) (2.5 MG/3ML) 0.083% nebulizer solution, Take 3 mLs (2.5 mg total) by nebulization every 6 (six) hours as needed for wheezing or shortness of breath., Disp: 150 mL, Rfl: 1 .  ALPRAZolam (XANAX) 0.5 MG tablet, Take 1 tablet (0.5 mg total) by mouth 2 (two) times daily as needed for anxiety., Disp: 60 tablet, Rfl: 2 .  amLODipine (NORVASC) 5 MG tablet, Take 1 tablet (5 mg total) by mouth daily., Disp: 90 tablet, Rfl: 3 .   buPROPion (WELLBUTRIN XL) 300 MG 24 hr tablet, Take 1 tablet (300 mg total) by mouth daily., Disp: 30 tablet, Rfl: 11 .  CALCIUM ALGINATE EX, Apply topically., Disp: , Rfl:  .  clobetasol cream (TEMOVATE) 2.44 %, Apply 1 application topically 2 (two) times daily. To arms, Disp: 60 g, Rfl: 0 .  cyanocobalamin 500 MCG tablet, Take 500 mcg by mouth daily., Disp: , Rfl:  .  dabigatran (PRADAXA) 150 MG CAPS capsule, Take 1 capsule (150 mg total) by mouth 2 (two) times daily., Disp: 180 capsule, Rfl: 2 .  dexlansoprazole (DEXILANT) 60 MG capsule, Take 1 capsule (60 mg total) by mouth daily. Minutes before meal (lunch), Disp: 90 capsule, Rfl: 1 .  DULoxetine (CYMBALTA) 60 MG capsule, Take 1 capsule (60 mg total) by mouth daily., Disp: 90 capsule, Rfl: 0 .  estradiol (ESTRACE) 0.1 MG/GM vaginal cream, Place 1 Applicatorful vaginally 3 (three) times a week., Disp: 42.5 g, Rfl: 12 .  Ferrous Gluconate 324 (37.5 Fe) MG TABS, Take 1 tablet (324 mg total) by mouth daily with lunch. With food, Disp: 90 tablet, Rfl: 3 .  ferrous sulfate 325 (65 FE) MG tablet, Take 325 mg by mouth daily with breakfast., Disp: , Rfl:  .  fluconazole (DIFLUCAN) 150 MG tablet, , Disp: , Rfl:  .  Fluticasone-Salmeterol (ADVAIR) 250-50 MCG/DOSE AEPB, Inhale 1 puff into the lungs 2 (two) times daily., Disp: , Rfl:  .  gabapentin (NEURONTIN) 800 MG tablet, Take 1 tablet (800 mg total) by mouth 4 (four) times daily., Disp: 360 tablet, Rfl: 0 .  levocetirizine (XYZAL) 5 MG tablet, Take 1 tablet (5 mg total) by mouth every evening., Disp: 90 tablet, Rfl: 3 .  levofloxacin (LEVAQUIN) 250 MG tablet, Take 1 tablet (250 mg total) by mouth daily., Disp: 5 tablet, Rfl: 0 .  levothyroxine (SYNTHROID, LEVOTHROID) 175 MCG tablet, Take 1 tablet (175 mcg  total) by mouth daily before breakfast., Disp: 90 tablet, Rfl: 0 .  metFORMIN (GLUCOPHAGE) 1000 MG tablet, TAKE ONE TABLET BY MOUTH TWICE DAILY (Patient taking differently: TAKE ONE TABLET IN THE  MORNING AND HALF TABLET AT NIGHT), Disp: 180 tablet, Rfl: 1 .  metoprolol succinate (TOPROL-XL) 100 MG 24 hr tablet, Take 1 tablet (100 mg total) by mouth daily. Take with or immediately following a meal., Disp: 30 tablet, Rfl: 1 .  mirabegron ER (MYRBETRIQ) 25 MG TB24 tablet, Take 1 tablet (25 mg total) by mouth daily., Disp: 30 tablet, Rfl: 12 .  montelukast (SINGULAIR) 10 MG tablet, TAKE ONE TABLET BY MOUTH EVERY DAY, Disp: 90 tablet, Rfl: 3 .  Multiple Vitamin (MULTIVITAMIN WITH MINERALS) TABS tablet, Take 1 tablet by mouth daily., Disp: , Rfl:  .  ondansetron (ZOFRAN) 4 MG tablet, Take 1 tablet (4 mg total) every 8 (eight) hours as needed by mouth for nausea or vomiting., Disp: 20 tablet, Rfl: 0 .  oxybutynin (DITROPAN XL) 15 MG 24 hr tablet, Take 1 tablet (15 mg total) by mouth at bedtime., Disp: 90 tablet, Rfl: 3 .  potassium chloride (K-DUR,KLOR-CON) 10 MEQ tablet, Take 10 mEq by mouth 2 (two) times daily., Disp: , Rfl:  .  quinapril (ACCUPRIL) 40 MG tablet, Take 1 tablet (40 mg total) by mouth daily., Disp: 90 tablet, Rfl: 1 .  triamcinolone cream (KENALOG) 0.1 %, Apply 1 application 2 (two) times daily as needed topically., Disp: 45 g, Rfl: 1  ROS  Constitutional: Denies any fever or chills Gastrointestinal: No reported hemesis, hematochezia, vomiting, or acute GI distress Musculoskeletal: Denies any acute onset joint swelling, redness, loss of ROM, or weakness Neurological: No reported episodes of acute onset apraxia, aphasia, dysarthria, agnosia, amnesia, paralysis, loss of coordination, or loss of consciousness  Allergies  Ms. Takayama is allergic to penicillins; aspirin; morphine and related; and terramycin [oxytetracycline].  [place]  Drug: Ms. Bobst  reports that she does not use drugs. Alcohol:  reports that she does not drink alcohol. Tobacco:  reports that she has never smoked. She has never used smokeless tobacco. Medical:  has a past medical history of Abnormal antibody  titer, Anxiety and depression, Arthritis, Asthma, Cystocele, Depression, Diabetes ([place]), DVT (deep venous thrombosis) ([place]), Dysrhythmia, Edema, GERD (gastroesophageal reflux disease), Heart murmur, History of IBS, History of methicillin resistant staphylococcus aureus (MRSA) (2008), Hyperlipidemia, Hypertension, Hypothyroidism, Insomnia, Kidney stone, Neuropathy involving both lower extremities, Obesity, Class II, BMI 35-39.9, with comorbidity, Paroxysmal atrial fibrillation ([place]) (2007), Peripheral vascular disease ([place]), Sleep apnea, Urinary incontinence, and Venous stasis dermatitis of both lower extremities. Surgical: Ms. Zuccaro  has a past surgical history that includes Ankle surgery (Right); Vaginal hysterectomy; Sleeve Gastroplasty; Lithotripsy; Kidney surgery (Right); transthoracic echocardiogram (08/03/2013); transesophageal echocardiogram (08/08/2013); Tonsillectomy; Total knee arthroplasty; Hiatal hernia repair; Cholecystectomy; I&D extremity (Left, 06/27/2015); Application if wound vac (Left, 06/27/2015); removal of left hematoma (Left); NM GATED MYOVIEW [facility] HX) (February 2017); Reverse shoulder arthroplasty (Right, 10/08/2015); Hernia repair; Ulnar nerve transposition (Right, 08/06/2016); arm surgery; Irrigation and debridement abscess (Left, 09/07/2016); Incision and drainage of wound (Left, 09/25/2016); Application if wound vac (Left, 09/25/2016); Colonoscopy; Upper gi endoscopy; Esophagogastroduodenoscopy (egd) with propofol (N/A, 01/11/2017); Colonoscopy with propofol (N/A, 01/11/2017); Colonoscopy with propofol (N/A, 02/22/2017); OTHER SURGICAL HISTORY; Total shoulder replacement (Right); Joint replacement (2013); and Colonoscopy with propofol (N/A, 05/31/2017). Family: family history includes Alcohol abuse in her father; Arthritis in her maternal grandfather, maternal grandmother, mother, paternal grandfather, and paternal grandmother; Breast cancer in her maternal aunt;  Cancer in her father and maternal  aunt; Cerebral aneurysm in her father; Diabetes in her father, maternal grandmother, and paternal grandmother; Heart disease in her maternal grandfather; Hypertension in her father, maternal grandfather, maternal grandmother, paternal grandfather, and paternal grandmother; Kidney disease in her mother; Mental illness in her sister; Peripheral vascular disease in her father; Skin cancer in her father; Varicose Veins in her mother.  Constitutional Exam  General appearance: Well nourished, well developed, and well hydrated. In no apparent acute distress Vitals:   08/24/17 1311  BP: (!) 147/57  Pulse: (!) 55  Resp: 16  Temp: 97.6 F (36.4 C)  SpO2: 100%  Weight: 254 lb (115.2 kg)  Height: 5' 5.5" (1.664 m)  Psych/Mental status: Alert, oriented x 3 (person, place, & time)       Eyes: PERLA Respiratory: No evidence of acute respiratory distress  Lumbar Spine Area Exam  Skin & Axial Inspection: No masses, redness, or swelling Alignment: Symmetrical Functional ROM: Unrestricted ROM       Stability: No instability detected Muscle Tone/Strength: Functionally intact. No obvious neuro-muscular anomalies detected. Sensory (Neurological): Unimpaired Palpation: Complains of area being tender to palpation       Provocative Tests: Lumbar Hyperextension/rotation test: deferred today       Lumbar quadrant test (Kemp's test): deferred today       Lumbar Lateral bending test: deferred today       Patrick's Maneuver: deferred today                   FABER test: deferred today       Thigh-thrust test: deferred today       S-I compression test: deferred today       S-I distraction test: deferred today        Gait & Posture Assessment  Ambulation: Patient ambulates using a walker Gait: Relatively normal for age and body habitus Posture: WNL   Lower Extremity Exam    Side: Right lower extremity  Side: Left lower extremity  Stability: No instability observed          Stability: No instability  observed          Skin & Extremity Inspection: Areas of redness  Skin & Extremity Inspection: Bandage patient to heel  Functional ROM: Unrestricted ROM                  Functional ROM: Unrestricted ROM                  Muscle Tone/Strength: Functionally intact. No obvious neuro-muscular anomalies detected.  Muscle Tone/Strength: Functionally intact. No obvious neuro-muscular anomalies detected.  Sensory (Neurological): Unimpaired  Sensory (Neurological): Unimpaired  Palpation: No palpable anomalies  Palpation: No palpable anomalies   Assessment  Primary Diagnosis & Pertinent Problem List: The primary encounter diagnosis was Peripheral vascular disease due to secondary diabetes mellitus ([place]). Diagnoses of Chronic right shoulder pain, Pain of joint of left ankle and foot, Chronic pain syndrome, and Diabetic peripheral neuropathy associated with type 2 diabetes mellitus ([place]) were also pertinent to this visit.  Status Diagnosis  Controlled Controlled Controlled 1. Peripheral vascular disease due to secondary diabetes mellitus ([place])   2. Chronic right shoulder pain   3. Pain of joint of left ankle and foot   4. Chronic pain syndrome   5. Diabetic peripheral neuropathy associated with type 2 diabetes mellitus ([place])     Problems updated and reviewed during this visit: Problem  Pain in Joint Involving Ankle  and Foot  Strain of Right Hip  Ischial Pain, Right  Muscle Strain of Left Hip   Plan of Care  Pharmacotherapy (Medications Ordered): No orders of the defined types were placed in this encounter.  New Prescriptions   No medications on file   Medications administered today: [name] had no medications administered during this visit. Lab-work, procedure(s), and/or referral(s): No orders of the defined types were placed in this encounter.  Imaging and/or referral(s): None  Interventional therapies: Planned, scheduled, and/or pending:  Continue with current  regimen (Always stop Pradaxa x 5 days prior to any blocks.)   Considering:  Diagnostic Right suprascapular nerve block Diagnostic right intra-articular knee injection Possible series of 5 Hyalgan knee injections Diagnostic right-sided genicular nerve block Possible right-sided genicular nerve radiofrequencyablation  Palliative right intra-articular shoulder joint injection Diagnostic right-sided suprascapular nerve block Possible right-sided suprascapular nerve radiofrequencyablation  Palliative right lateral femoral cutaneous nerve block Palliative repeat right sided lateral femoral continuous nerve radiofrequencyablation   Palliative PRN treatment(s):  Diagnostic right intra-articular knee injection Possible series of 5 Hyalgan knee injections Diagnostic right-sided genicular nerve block Palliative right intra-articular shoulder joint injection Diagnostic right-sided suprascapular nerve block Palliative right lateral femoral cutaneous nerve block Palliative repeat right sided lateral femoral continuous nerve radiofrequencyablation   Provider-requested follow-up: Return in about 3 months (around 11/24/2017) for MedMgmt with Me Donella Stade Edison Pace).  Future Appointments  Date Time Provider [place]  08/25/2017  3:15 PM [place], PT [facility] None  08/31/2017  4:00 PM [place], Gore S, PT [facility] None  09/02/2017  3:00 PM McGowan, [name] A, PA-C [facility] None  09/07/2017  3:15 PM [place], Kristine S, PT [facility] None  09/09/2017  4:00 PM [place], Ivy S, PT [facility] None  09/14/2017  3:15 PM [place], Kristine S, PT [facility] None  09/16/2017  3:15 PM [place], Kristine S, PT [facility] None  09/21/2017  3:15 PM Alanson Puls, PT [facility] None  09/28/2017  3:15 PM Alanson Puls, PT [facility] None  09/30/2017  3:15 PM [place], Sherryl Barters, PT [facility] None  10/04/2017  2:30 PM [name], [name], MD [id number]    Primary Care Physician: [name], [name], MD Location: [facility] Facility Note by: Vevelyn Francois NP Date: 08/24/2017; Time: 1:57 PM  Pain Score Disclaimer: We use the NRS-11 scale. This is a self-reported, subjective measurement of pain severity with only modest accuracy. It is used primarily to identify changes within a particular patient. It must be understood that outpatient pain scales are significantly less accurate that those used for research, where they can be applied under ideal controlled circumstances with minimal exposure to variables. In reality, the score is likely to be a combination of pain intensity and pain affect, where pain affect describes the degree of emotional arousal or changes in action readiness caused by the sensory experience of pain. Factors such as social and work situation, setting, emotional state, anxiety levels, expectation, and prior pain experience may influence pain perception and show large inter-individual differences that may also be affected by time variables.  Patient instructions provided during this appointment: Patient Instructions   ____________________________________________________________________________________________  Medication Rules  Applies to: All patients receiving prescriptions (written or electronic).  Pharmacy of record: Pharmacy where electronic prescriptions will be sent. If written prescriptions are taken to a different pharmacy, please inform the nursing staff. The pharmacy listed in the electronic medical record should be the one where you would like electronic prescriptions to be sent.  Prescription  refills: Only during scheduled appointments. Applies to both, written and electronic prescriptions.  NOTE: The following applies primarily to controlled substances (Opioid* Pain Medications).   Patient's responsibilities: 1. Pain Pills: Bring all pain pills to every appointment (except for  procedure appointments). 2. Pill Bottles: Bring pills in original pharmacy bottle. Always bring newest bottle. Bring bottle, even if empty. 3. Medication refills: You are responsible for knowing and keeping track of what medications you need refilled. The day before your appointment, write a list of all prescriptions that need to be refilled. Bring that list to your appointment and give it to the admitting nurse. Prescriptions will be written only during appointments. If you forget a medication, it will not be "Called in", "Faxed", or "electronically sent". You will need to get another appointment to get these prescribed. 4. Prescription Accuracy: You are responsible for carefully inspecting your prescriptions before leaving our office. Have the discharge nurse carefully go over each prescription with you, before taking them home. Make sure that your name is accurately spelled, that your address is correct. Check the name and dose of your medication to make sure it is accurate. Check the number of pills, and the written instructions to make sure they are clear and accurate. Make sure that you are given enough medication to last until your next medication refill appointment. 5. Taking Medication: Take medication as prescribed. Never take more pills than instructed. Never take medication more frequently than prescribed. Taking less pills or less frequently is permitted and encouraged, when it comes to controlled substances (written prescriptions).  6. Inform other Doctors: Always inform, all of your healthcare providers, of all the medications you take. 7. Pain Medication from other Providers: You are not allowed to accept any additional pain medication from any other Doctor or Healthcare provider. There are two exceptions to this rule. (see below) In the event that you require additional pain medication, you are responsible for notifying us, as stated below. 8. Medication Agreement: You are responsible for  carefully reading and following our Medication Agreement. This must be signed before receiving any prescriptions from our practice. Safely store a copy of your signed Agreement. Violations to the Agreement will result in no further prescriptions. (Additional copies of our Medication Agreement are available upon request.) 9. Laws, Rules, & Regulations: All patients are expected to follow all Federal and [employer], [employer], Rules, [employer]. Ignorance of the Laws does not constitute a valid excuse. The use of any illegal substances is prohibited. 10. Adopted CDC guidelines & recommendations: Target dosing levels will be at or below 60 MME/day. Use of benzodiazepines** is not recommended.  Exceptions: There are only two exceptions to the rule of not receiving pain medications from other Healthcare Providers. 1. Exception #1 (Emergencies): In the event of an emergency (i.e.: accident requiring emergency care), you are allowed to receive additional pain medication. However, you are responsible for: As soon as you are able, call our office [phone], at any time of the day or night, and leave a message stating your name, the date and nature of the emergency, and the name and dose of the medication prescribed. In the event that your call is answered by a member of our staff, make sure to document and save the date, time, and the name of the person that took your information.  2. Exception #2 (Planned Surgery): In the event that you are scheduled by another doctor or dentist to have any type of surgery or procedure, you are  allowed (for a period no longer than 30 days), to receive additional pain medication, for the acute post-op pain. However, in this case, you are responsible for picking up a copy of our "Post-op Pain Management for Surgeons" handout, and giving it to your surgeon or dentist. This document is available at our office, and does not require an appointment to obtain it. Simply go to our  office during business hours (Monday-Thursday from 8:00 AM to 4:00 PM) (Friday 8:00 AM to 12:00 Noon) or if you have a scheduled appointment with [place], prior to your surgery, and ask for it by name. In addition, you will need to provide [place] with your name, name of your surgeon, type of surgery, and date of procedure or surgery.  *Opioid medications include: morphine, codeine, oxycodone, oxymorphone, hydrocodone, hydromorphone, meperidine, tramadol, tapentadol, buprenorphine, fentanyl, methadone. **Benzodiazepine medications include: diazepam (Valium), alprazolam (Xanax), clonazepam (Klonopine), lorazepam (Ativan), clorazepate (Tranxene), chlordiazepoxide (Librium), estazolam (Prosom), oxazepam (Serax), temazepam (Restoril), triazolam (Halcion) (Last updated: 05/20/2017) ____________________________________________________________________________________________    BMI Assessment: Estimated body mass index is 41.62 kg/m as calculated from the following:   Height as of this encounter: 5' 5.5" (1.664 m).   Weight as of this encounter: 254 lb (115.2 kg).  BMI interpretation table: BMI level Category Range association with higher incidence of chronic pain  <18 kg/m2 Underweight   18.5-24.9 kg/m2 Ideal body weight   25-29.9 kg/m2 Overweight Increased incidence by 20%  30-34.9 kg/m2 Obese (Class I) Increased incidence by 68%  35-39.9 kg/m2 Severe obesity (Class II) Increased incidence by 136%  >40 kg/m2 Extreme obesity (Class III) Increased incidence by 254%   Patient's current BMI Ideal Body weight  Body mass index is 41.62 kg/m. Ideal body weight: 58.2 kg (128 lb 3.2 oz) Adjusted ideal body weight: 81 kg (178 lb 8.3 oz)   BMI Readings from Last 4 Encounters:  08/24/17 41.62 kg/m  08/02/17 43.90 kg/m  06/24/17 42.33 kg/m  06/08/17 42.43 kg/m   Wt Readings from Last 4 Encounters:  08/24/17 254 lb (115.2 kg)  08/02/17 263 lb 12.8 oz (119.7 kg)  06/24/17 254 lb 6.4 oz (115.4 kg)   06/08/17 255 lb (115.7 kg)

## 2017-08-24 NOTE — Progress Notes (Signed)
Nursing Pain Medication Assessment:  Safety precautions to be maintained throughout the outpatient stay will include: orient to surroundings, keep bed in low position, maintain call bell within reach at all times, provide assistance with transfer out of bed and ambulation.  Medication Inspection Compliance: Pill count conducted under aseptic conditions, in front of the patient. Neither the pills nor the bottle was removed from the patient's sight at any time. Once count was completed pills were immediately returned to the patient in their original bottle.  Medication: See above Pill/Patch Count: 51 of 90 pills remain Pill/Patch Appearance: Markings consistent with prescribed medication Bottle Appearance: Standard pharmacy container. Clearly labeled. Filled Date: 5 / 22 / 2019 Last Medication intake:  Today

## 2017-08-25 ENCOUNTER — Ambulatory Visit: Payer: PPO | Admitting: Physical Therapy

## 2017-08-27 ENCOUNTER — Other Ambulatory Visit: Payer: Self-pay | Admitting: Family Medicine

## 2017-08-27 ENCOUNTER — Other Ambulatory Visit: Payer: Self-pay | Admitting: Internal Medicine

## 2017-08-27 ENCOUNTER — Other Ambulatory Visit: Payer: Self-pay | Admitting: Urology

## 2017-08-27 DIAGNOSIS — F329 Major depressive disorder, single episode, unspecified: Secondary | ICD-10-CM

## 2017-08-27 DIAGNOSIS — F419 Anxiety disorder, unspecified: Secondary | ICD-10-CM

## 2017-08-27 DIAGNOSIS — I482 Chronic atrial fibrillation, unspecified: Secondary | ICD-10-CM

## 2017-08-27 DIAGNOSIS — E039 Hypothyroidism, unspecified: Secondary | ICD-10-CM

## 2017-08-27 MED ORDER — LEVOTHYROXINE SODIUM 175 MCG PO TABS: 175.0000 ug | ORAL_TABLET | Freq: Every day | ORAL | 3 refills | Status: DC

## 2017-08-27 MED ORDER — METOPROLOL SUCCINATE ER 100 MG PO TB24: 100.0000 mg | ORAL_TABLET | Freq: Every day | ORAL | 0 refills | Status: DC

## 2017-08-27 MED ORDER — ALPRAZOLAM 0.5 MG PO TABS: 0.5000 mg | ORAL_TABLET | Freq: Two times a day (BID) | ORAL | 2 refills | Status: DC | PRN

## 2017-08-28 LAB — TOXASSURE SELECT 13 (MW), URINE

## 2017-08-31 ENCOUNTER — Ambulatory Visit: Payer: PPO | Attending: Surgery | Admitting: Physical Therapy

## 2017-08-31 DIAGNOSIS — M6281 Muscle weakness (generalized): Secondary | ICD-10-CM | POA: Insufficient documentation

## 2017-08-31 DIAGNOSIS — M25552 Pain in left hip: Secondary | ICD-10-CM | POA: Insufficient documentation

## 2017-08-31 DIAGNOSIS — R262 Difficulty in walking, not elsewhere classified: Secondary | ICD-10-CM | POA: Insufficient documentation

## 2017-09-02 ENCOUNTER — Encounter: Payer: Self-pay | Admitting: Urology

## 2017-09-02 ENCOUNTER — Ambulatory Visit (INDEPENDENT_AMBULATORY_CARE_PROVIDER_SITE_OTHER): Payer: PPO | Admitting: Urology

## 2017-09-02 VITALS — BP 165/73 | HR 65 | Ht 65.5 in | Wt 264.8 lb

## 2017-09-02 DIAGNOSIS — N3946 Mixed incontinence: Secondary | ICD-10-CM

## 2017-09-02 DIAGNOSIS — N200 Calculus of kidney: Secondary | ICD-10-CM | POA: Diagnosis not present

## 2017-09-02 DIAGNOSIS — N8111 Cystocele, midline: Secondary | ICD-10-CM

## 2017-09-02 DIAGNOSIS — N952 Postmenopausal atrophic vaginitis: Secondary | ICD-10-CM | POA: Diagnosis not present

## 2017-09-02 DIAGNOSIS — N390 Urinary tract infection, site not specified: Secondary | ICD-10-CM

## 2017-09-02 LAB — BLADDER SCAN AMB NON-IMAGING: Scan Result: 0

## 2017-09-02 MED ORDER — MIRABEGRON ER 50 MG PO TB24: 50.0000 mg | ORAL_TABLET | Freq: Every day | ORAL | 0 refills | Status: DC

## 2017-09-02 MED ORDER — ZINC OXIDE 20 % EX OINT: 1.0000 "application " | TOPICAL_OINTMENT | CUTANEOUS | 0 refills | Status: DC | PRN

## 2017-09-02 NOTE — Progress Notes (Signed)
09/02/2017 4:22 PM   [name] 1950-10-06 [id number]  Referring provider: [name], [name], MD [name], [name] [address]  Chief Complaint  Patient presents with  . Urinary Incontinence    HPI: Patient is a 67 year old Caucasian female with a history of urinary incontinence, vaginal atrophy and history of renal stones who presents today for a referral from Dr. [name] for recurrent UTI's.  Urinary incontinence Patient has been on Myrbetriq 25 mg and oxybutynin XL 15 mg daily. Her PVR on today's exam is 0 mL.  She states that she is losing urine when she stands.  She is wearing depends daily.  She is having most of her issues during the day.  She is not experiencing suprapubic pain, gross hematuria or dysuria.  The patient has been experiencing urgency x 4-7 (unchanged), frequency x 8  or more (unchanged), is restricting fluids to avoid visits to the restroom, is engaging in toilet mapping, incontinence x 8 or more (unchanged) and nocturia x 0-3 (unchanged).    Vaginal atrophy She is using vaginal estrogen cream three nights weekly.   History of kidney stones She has not had any recent flank pain or gross hematuria.  A CT scan performed in 2014 did not note any nephrolithiasis. CT of the adrenal's on 08/12/2017 noted a nonobstructive left renal calculus is noted. No hydronephrosis or renal obstruction is noted. Bilateral renal cortical scarring is noted.  Recurrent UTIs Patient states that she has had two urinary tract infections since March.  One of her UTIs occurred over the weekend, she started an antibiotic that she had at home.  Her urine culture upon return was negative.  Reviewing her records,  she has had two positive urine cultures, one was positive for ESBL Klebisiella.  Her symptoms with a urinary tract infection consist of bladder spasms.  She denies/endorses dysuria, gross hematuria, suprapubic pain, back pain, abdominal pain or flank pain  associated with UTI's.  She has not had any recent fevers, chills, nausea or vomiting associated with UTI's.  She is not sexually active.  She is postmenopausal.  She admits to  diarrhea.  She does engage in good perineal hygiene. She does not take tub baths.   She is drinking not drinking a lot of water daily.  She is not having symptoms of an UTI at this visit.    PMH: Past Medical History:  Diagnosis Date  . Abnormal antibody titer   . Anxiety and depression   . Arthritis   . Asthma   . Cystocele   . Depression   . Diabetes ([name])   . DVT (deep venous thrombosis) ([facility])    Righ calf  . Dysrhythmia   . Edema   . GERD (gastroesophageal reflux disease)   . Heart murmur    Echocardiogram June 2015: Mild MR (possible vegetation seen on TTE, not seen on TEE), normal LV size with moderate concentric LVH. Normal function EF 60-65%. Normal diastolic function. Mild LA dilation.  Marland Kitchen History of IBS   . History of methicillin resistant staphylococcus aureus (MRSA) 2008  . Hyperlipidemia   . Hypertension   . Hypothyroidism   . Insomnia   . Kidney stone    kidney stones with lithotripsy  . Neuropathy involving both lower extremities   . Obesity, Class II, BMI 35-39.9, with comorbidity   . Paroxysmal atrial fibrillation ([place]) 2007   In June 2015, Cardiac Event Monitor: Mostly SR/sinus arrhythmia with PVCs that are frequent. Short bursts of A. fib  lasting several minutes;; CHA2DS2-VASc Score = 5 (age, Female, PVD, DM, HTN)  . Peripheral vascular disease ([place])   . Sleep apnea    use C-PAP  . Urinary incontinence   . Venous stasis dermatitis of both lower extremities     Surgical History: Past Surgical History:  Procedure Laterality Date  . ANKLE SURGERY Right   . APPLICATION OF WOUND VAC Left 06/27/2015   Procedure: APPLICATION OF WOUND VAC ( POSSIBLE ) ;  Surgeon: [name], MD;  Location: [facility] ORS;  Service: Vascular;  Laterality: Left;  . APPLICATION OF WOUND VAC Left 09/25/2016    Procedure: APPLICATION OF WOUND VAC;  Surgeon: [name], DPM;  Location: [facility] ORS;  Service: Podiatry;  Laterality: Left;  . arm surgery     right    . CHOLECYSTECTOMY    . COLONOSCOPY    . COLONOSCOPY WITH PROPOFOL N/A 01/11/2017   Procedure: COLONOSCOPY WITH PROPOFOL;  Surgeon: Dawn Sails, MD;  Location[facility] ENDOSCOPY;  Service: Endoscopy;  Laterality: N/A;  . COLONOSCOPY WITH PROPOFOL N/A 02/22/2017   Procedure: COLONOSCOPY WITH PROPOFOL;  Surgeon: Dawn Sails, MD;  Location: [facility] ENDOSCOPY;  Service: Endoscopy;  Laterality: N/A;  . COLONOSCOPY WITH PROPOFOL N/A 05/31/2017   Procedure: COLONOSCOPY WITH PROPOFOL;  Surgeon: Dawn Sails, MD;  Location: [facility] ENDOSCOPY;  Service: Endoscopy;  Laterality: N/A;  . ESOPHAGOGASTRODUODENOSCOPY (EGD) WITH PROPOFOL N/A 01/11/2017   Procedure: ESOPHAGOGASTRODUODENOSCOPY (EGD) WITH PROPOFOL;  Surgeon: Dawn Sails, MD;  Location: [facility] ENDOSCOPY;  Service: Endoscopy;  Laterality: N/A;  . HERNIA REPAIR     umbilical  . HIATAL HERNIA REPAIR    . I&D EXTREMITY Left 06/27/2015   Procedure: IRRIGATION AND DEBRIDEMENT EXTREMITY            ( CALF HEMATOMA ) POSSIBLE WOUND VAC;  Surgeon: [name], MD;  Location: [facility] ORS;  Service: Vascular;  Laterality: Left;  . INCISION AND DRAINAGE OF WOUND Left 09/25/2016   Procedure: IRRIGATION AND DEBRIDEMENT - PARTIAL RESECTION OF ACHILLES TENDON WITH WOUND VAC APPLICATION;  Surgeon: [name], DPM;  Location: [facility] ORS;  Service: Podiatry;  Laterality: Left;  . IRRIGATION AND DEBRIDEMENT ABSCESS Left 09/07/2016   Procedure: IRRIGATION AND DEBRIDEMENT ABSCESS LEFT HEEL;  Surgeon: [name], DPM;  Location: [facility] ORS;  Service: Podiatry;  Laterality: Left;  . JOINT REPLACEMENT  2013   left knee replacement  . KIDNEY SURGERY Right    kidney stones  . LITHOTRIPSY    . [name] HX)  February 2017   Likely breast attenuation. LOW RISK study. Normal EF 55-60%.  .  OTHER SURGICAL HISTORY     08/2016 or 09/2016 surgery on achilles tenso h/o staph infection heal. Dr. [name]  . removal of left hematoma Left    leg  . REVERSE SHOULDER ARTHROPLASTY Right 10/08/2015   Procedure: REVERSE SHOULDER ARTHROPLASTY;  Surgeon: [name], MD;  Location: [facility] ORS;  Service: Orthopedics;  Laterality: Right;  . SLEEVE GASTROPLASTY    . TONSILLECTOMY    . TOTAL KNEE ARTHROPLASTY    . TOTAL SHOULDER REPLACEMENT Right   . TRANSESOPHAGEAL ECHOCARDIOGRAM  08/08/2013   Mild LVH, EF 60-65%. Moderate LA dilation and mild RA dilation. Mild MR with no evidence of stenosis and no evidence of endocarditis. A false chordae is noted.  . TRANSTHORACIC ECHOCARDIOGRAM  08/03/2013   Mild-Moderate concentric LVH, EF 60-65%. Normal diastolic function. Mild LA dilation. Mild MR with possible vegetation  - not confirmed  on TEE   . ULNAR NERVE TRANSPOSITION Right 08/06/2016   Procedure: ULNAR NERVE DECOMPRESSION/TRANSPOSITION;  Surgeon: [name], MD;  Location: [facility] ORS;  Service: Orthopedics;  Laterality: Right;  . UPPER GI ENDOSCOPY    . VAGINAL HYSTERECTOMY      Home Medications:  Allergies as of 09/02/2017      Reactions   Penicillins Hives, Shortness Of Breath, Swelling   Facial swelling Has patient had a PCN reaction causing immediate rash, facial/tongue/throat swelling, SOB or lightheadedness with hypotension: Yes Has patient had a PCN reaction causing severe rash involving mucus membranes or skin necrosis: Yes Has patient had a PCN reaction that required hospitalization No Has patient had a PCN reaction occurring within the last 10 years: No If all of the above answers are "NO", then may proceed with Cephalosporin use.   Aspirin Hives   Morphine And Related Other (See Comments)   Patient becomes very confused   Terramycin [oxytetracycline] Hives      Medication List        Accurate as of 09/02/17  4:22 PM. Always use your most recent med list.          albuterol  (2.5 MG/3ML) 0.083% nebulizer solution Commonly known as:  PROVENTIL Take 3 mLs (2.5 mg total) by nebulization every 6 (six) hours as needed for wheezing or shortness of breath.   albuterol 108 (90 Base) MCG/ACT inhaler Commonly known as:  PROVENTIL HFA;VENTOLIN HFA Inhale 1-2 puffs into the lungs every 4 (four) hours as needed for wheezing or shortness of breath.   ALPRAZolam 0.5 MG tablet Commonly known as:  XANAX Take 1 tablet (0.5 mg total) by mouth 2 (two) times daily as needed for anxiety.   amLODipine 5 MG tablet Commonly known as:  NORVASC Take 1 tablet (5 mg total) by mouth daily.   buPROPion 300 MG 24 hr tablet Commonly known as:  WELLBUTRIN XL Take 1 tablet (300 mg total) by mouth daily.   CALCIUM ALGINATE EX Apply topically.   clobetasol cream 0.05 % Commonly known as:  TEMOVATE Apply 1 application topically 2 (two) times daily. To arms   dabigatran 150 MG Caps capsule Commonly known as:  PRADAXA Take 1 capsule (150 mg total) by mouth 2 (two) times daily.   dexlansoprazole 60 MG capsule Commonly known as:  DEXILANT Take 1 capsule (60 mg total) by mouth daily. Minutes before meal (lunch)   DULoxetine 60 MG capsule Commonly known as:  CYMBALTA Take 1 capsule (60 mg total) by mouth daily.   estradiol 0.1 MG/GM vaginal cream Commonly known as:  ESTRACE Place 1 Applicatorful vaginally 3 (three) times a week.   Ferrous Gluconate 324 (37.5 Fe) MG Tabs Take 1 tablet (324 mg total) by mouth daily with lunch. With food   ferrous sulfate 325 (65 FE) MG tablet Take 325 mg by mouth daily with breakfast.   fluconazole 150 MG tablet Commonly known as:  DIFLUCAN   Fluticasone-Salmeterol 250-50 MCG/DOSE Aepb Commonly known as:  ADVAIR Inhale 1 puff into the lungs 2 (two) times daily.   gabapentin 800 MG tablet Commonly known as:  NEURONTIN Take 1 tablet (800 mg total) by mouth 4 (four) times daily.   HYDROcodone-acetaminophen 5-325 MG tablet Commonly known as:   NORCO/VICODIN Take 1 tablet by mouth every 8 (eight) hours as needed for severe pain. Start taking on:  09/10/2017   HYDROcodone-acetaminophen 5-325 MG tablet Commonly known as:  NORCO/VICODIN Take 1 tablet by mouth every 8 (eight) hours  as needed for severe pain. Start taking on:  10/10/2017   HYDROcodone-acetaminophen 5-325 MG tablet Commonly known as:  NORCO/VICODIN Take 1 tablet by mouth every 8 (eight) hours as needed for severe pain. Start taking on:  11/09/2017   levocetirizine 5 MG tablet Commonly known as:  XYZAL TAKE 1 TABLET BY MOUTH EVERY EVENING   levothyroxine 175 MCG tablet Commonly known as:  SYNTHROID, LEVOTHROID Take 1 tablet (175 mcg total) by mouth daily before breakfast.   metFORMIN 1000 MG tablet Commonly known as:  GLUCOPHAGE TAKE ONE TABLET BY MOUTH TWICE DAILY   metoprolol succinate 100 MG 24 hr tablet Commonly known as:  TOPROL-XL Take 1 tablet (100 mg total) by mouth daily. Take with or immediately following a meal.   mirabegron ER 25 MG Tb24 tablet Commonly known as:  MYRBETRIQ Take 1 tablet (25 mg total) by mouth daily.   mirabegron ER 50 MG Tb24 tablet Commonly known as:  MYRBETRIQ Take 1 tablet (50 mg total) by mouth daily.   montelukast 10 MG tablet Commonly known as:  SINGULAIR TAKE ONE TABLET BY MOUTH EVERY DAY   multivitamin with minerals Tabs tablet Take 1 tablet by mouth daily.   ondansetron 4 MG tablet Commonly known as:  ZOFRAN Take 1 tablet (4 mg total) every 8 (eight) hours as needed by mouth for nausea or vomiting.   oxybutynin 15 MG 24 hr tablet Commonly known as:  DITROPAN XL TAKE ONE TABLET BY MOUTH AT BEDTIME   potassium chloride 10 MEQ tablet Commonly known as:  K-DUR,KLOR-CON Take 10 mEq by mouth 2 (two) times daily.   quinapril 40 MG tablet Commonly known as:  ACCUPRIL Take 1 tablet (40 mg total) by mouth daily.   triamcinolone cream 0.1 % Commonly known as:  KENALOG Apply 1 application 2 (two) times daily as  needed topically.   vitamin B-12 500 MCG tablet Commonly known as:  CYANOCOBALAMIN Take 500 mcg by mouth daily.   zinc oxide 20 % ointment Apply 1 application topically as needed for irritation.       Allergies:  Allergies  Allergen Reactions  . Penicillins Hives, Shortness Of Breath and Swelling    Facial swelling Has patient had a PCN reaction causing immediate rash, facial/tongue/throat swelling, SOB or lightheadedness with hypotension: Yes Has patient had a PCN reaction causing severe rash involving mucus membranes or skin necrosis: Yes Has patient had a PCN reaction that required hospitalization No Has patient had a PCN reaction occurring within the last 10 years: No If all of the above answers are "NO", then may proceed with Cephalosporin use.   . Aspirin Hives  . Morphine And Related Other (See Comments)    Patient becomes very confused  . Terramycin [Oxytetracycline] Hives    Family History: Family History  Problem Relation Age of Onset  . Skin cancer Father   . Diabetes Father   . Hypertension Father   . Peripheral vascular disease Father   . Cancer Father   . Cerebral aneurysm Father   . Alcohol abuse Father   . Varicose Veins Mother   . Kidney disease Mother   . Arthritis Mother   . Mental illness Sister   . Cancer Maternal Aunt        breast  . Breast cancer Maternal Aunt   . Arthritis Maternal Grandmother   . Hypertension Maternal Grandmother   . Diabetes Maternal Grandmother   . Arthritis Maternal Grandfather   . Heart disease Maternal Grandfather   .  Hypertension Maternal Grandfather   . Arthritis Paternal Grandmother   . Hypertension Paternal Grandmother   . Diabetes Paternal Grandmother   . Arthritis Paternal Grandfather   . Hypertension Paternal Grandfather   . Bladder Cancer Neg Hx   . Kidney cancer Neg Hx     Social History:  reports that she has never smoked. She has never used smokeless tobacco. She reports that she does not drink  alcohol or use drugs.  ROS: UROLOGY Frequent Urination?: No Hard to postpone urination?: Yes Burning/pain with urination?: No Get up at night to urinate?: No Leakage of urine?: Yes Urine stream starts and stops?: No Trouble starting stream?: Yes Do you have to strain to urinate?: No Blood in urine?: No Urinary tract infection?: Yes Sexually transmitted disease?: No Injury to kidneys or bladder?: No Painful intercourse?: No Weak stream?: No Currently pregnant?: No Vaginal bleeding?: No Last menstrual period?: n  Gastrointestinal Nausea?: No Vomiting?: No Indigestion/heartburn?: No Diarrhea?: Yes Constipation?: No  Constitutional Fever: No Night sweats?: No Weight loss?: No Fatigue?: Yes  Skin Skin rash/lesions?: No Itching?: No  Eyes Double vision?: No  Ears/Nose/Throat Sore throat?: No Sinus problems?: No  Hematologic/Lymphatic Swollen glands?: No Easy bruising?: Yes  Cardiovascular Leg swelling?: Yes Chest pain?: No  Respiratory Cough?: Yes Shortness of breath?: No  Endocrine Excessive thirst?: No  Musculoskeletal Back pain?: No Joint pain?: Yes  Neurological Headaches?: No Dizziness?: No  Psychologic Depression?: No Anxiety?: Yes  Physical Exam: BP (!) 165/73 (BP Location: Right Arm, Patient Position: Sitting, Cuff Size: Large)   Pulse 65   Ht 5' 5.5" (1.664 m)   Wt 264 lb 12.8 oz (120.1 kg)   BMI 43.39 kg/m   Constitutional: Well nourished. Alert and oriented, No acute distress. HEENT: [place] AT, moist mucus membranes. Trachea midline, no masses. Cardiovascular: No clubbing, cyanosis, or edema. Respiratory: Normal respiratory effort, no increased work of breathing. GI: Abdomen is soft, non tender, non distended, no abdominal masses. Liver and spleen not palpable.  No hernias appreciated.  Stool sample for occult testing is not indicated.   GU: No CVA tenderness.  No bladder fullness or masses.  Atrophic external genitalia, normal  pubic hair distribution, no lesions.  Normal urethral meatus, no lesions, no prolapse, no discharge.   No urethral masses, tenderness and/or tenderness. No bladder fullness, tenderness or masses. Pale vagina mucosa, moderate estrogen effect, no discharge, no lesions, Poor pelvic support, Grade I cystocele.  No rectocele noted.  Cervix, uterus and adnexa are surgically absent.  Anus and perineum are without rashes or lesions.    Skin: No rashes, bruises or suspicious lesions. Lymph: No cervical or inguinal adenopathy. Neurologic: Grossly intact, no focal deficits, moving all 4 extremities. Psychiatric: Normal mood and affect.  Results for PAISLEI, DORVAL (MRN [id number]) as of 09/04/2017 15:42  Ref. Range 09/02/2017 15:50  Scan Result Unknown 0    Pertinent imaging CLINICAL DATA:  Adrenal mass.  EXAM: CT ABDOMEN WITHOUT CONTRAST  TECHNIQUE: Multidetector CT imaging of the abdomen was performed following the standard protocol without IV contrast.  COMPARISON:  CT scan of August 23, 2012.  MRI of December 14, 2014.  FINDINGS: Lower chest: No acute abnormality.  Hepatobiliary: No focal liver abnormality is seen. Status post cholecystectomy. No biliary dilatation.  Pancreas: Unremarkable. No pancreatic ductal dilatation or surrounding inflammatory changes.  Spleen: Normal in size without focal abnormality.  Adrenals/Urinary Tract: Stable bilateral adrenal masses are noted with macroscopic fat present consistent with myelolipomas. The right lesion measures 7.6  cm, and the left lesion measures 9.8 cm. Bilateral renal cortical scarring is noted. Nonobstructive left renal calculus is noted. No hydronephrosis or renal obstruction is noted.  Stomach/Bowel: Status post gastric surgery. No definite abnormality seen involving the visualized large and small bowel. Visualized portion of appendix appears normal.  Vascular/Lymphatic: No significant vascular findings are present.  No enlarged abdominal or pelvic lymph nodes.  Other: No abdominal wall hernia or abnormality.  Musculoskeletal: Multilevel degenerative disc disease is noted in the lumbar spine. No acute osseous abnormality is noted.  IMPRESSION: Stable bilateral adrenal myelolipomas are noted. No further follow-up is required.  Nonobstructive left renal calculus is noted. No hydronephrosis or renal obstruction is noted. Bilateral renal cortical scarring is noted.   Electronically Signed   By: Marijo Conception, M.D.   On: 08/12/2017 13:05  I have independently reviewed the films   Assessment & Plan:    1. Recurrent UTI's Criteria for recurrent UTI has been met with 2 or more infections in 6 months or 3 or greater infections in one year  Encouraged patient is to increase their water intake until the urine is pale yellow or clear (10 to 12 cups daily)  Instructed the patient is instructed to take probiotics (yogurt, oral pills or vaginal suppositories), take cranberry pills or drink the juice and Vitamin C 1,000 mg daily to acidify the urine  Avoid soaking in tubs and wipe front to back after urinating  She would benefit from core strengthening exercises has been seen.  We can refer to PT if they desire - referral in place Advised them to have CATH UA's for urinalysis and culture to prevent skin, vaginal and/or rectal contamination of the specimen  2. Left kidney stone No intervention warranted at this time Will continue to monitor KUB in one year                         3. Mixed urinary incontinence BLADDER SCAN AMB NON-IMAGING Continue Myrbetriq  25 mg daily and oxybutynin XL 15 mg daily Refer to PT Refer for pessary evaluation  4. Atrophic vaginitis  - continue vaginal estrogen cream three nights weekly  Follow up in three months Zara Council, [facility]  [address] [place] [place], [place] [address] [phone]

## 2017-09-07 ENCOUNTER — Ambulatory Visit: Payer: PPO | Admitting: Physical Therapy

## 2017-09-07 ENCOUNTER — Encounter: Payer: Self-pay | Admitting: Physical Therapy

## 2017-09-07 DIAGNOSIS — M6281 Muscle weakness (generalized): Secondary | ICD-10-CM

## 2017-09-07 DIAGNOSIS — R262 Difficulty in walking, not elsewhere classified: Secondary | ICD-10-CM

## 2017-09-07 DIAGNOSIS — M25552 Pain in left hip: Secondary | ICD-10-CM | POA: Diagnosis not present

## 2017-09-07 NOTE — Therapy (Signed)
[place] MAIN [facility] SERVICES [address] [place], [place], [address] Phone: [phone]   Fax:  [phone]  Physical Therapy Treatment  Patient Details  Name: [name] MRN: [id number] Date of Birth: 1950-05-12 Referring Provider: Dr. Roland Rack   Encounter Date: 09/07/2017  PT End of Session - 09/07/17 1614    Visit Number  19    Number of Visits  34    Date for PT Re-Evaluation  10/19/17    PT Start Time  0405    PT Stop Time  0445    PT Time Calculation (min)  40 min    Equipment Utilized During Treatment  Gait belt    Activity Tolerance  Patient tolerated treatment well;Patient limited by pain;Patient limited by fatigue    Behavior During Therapy  [facility] for tasks assessed/performed       Past Medical History:  Diagnosis Date  . Abnormal antibody titer   . Anxiety and depression   . Arthritis   . Asthma   . Cystocele   . Depression   . Diabetes ([place])   . DVT (deep venous thrombosis) (HCC)    Righ calf  . Dysrhythmia   . Edema   . GERD (gastroesophageal reflux disease)   . Heart murmur    Echocardiogram June 2015: Mild MR (possible vegetation seen on TTE, not seen on TEE), normal LV size with moderate concentric LVH. Normal function EF 60-65%. Normal diastolic function. Mild LA dilation.  Marland Kitchen History of IBS   . History of methicillin resistant staphylococcus aureus (MRSA) 2008  . Hyperlipidemia   . Hypertension   . Hypothyroidism   . Insomnia   . Kidney stone    kidney stones with lithotripsy  . Neuropathy involving both lower extremities   . Obesity, Class II, BMI 35-39.9, with comorbidity   . Paroxysmal atrial fibrillation ([place]) 2007   In June 2015, Cardiac Event Monitor: Mostly SR/sinus arrhythmia with PVCs that are frequent. Short bursts of A. fib lasting several minutes;; CHA2DS2-VASc Score = 5 (age, Female, PVD, DM, HTN)  . Peripheral vascular disease ([place])   . Sleep apnea    use C-PAP  . Urinary incontinence   .  Venous stasis dermatitis of both lower extremities     Past Surgical History:  Procedure Laterality Date  . ANKLE SURGERY Right   . APPLICATION OF WOUND VAC Left 06/27/2015   Procedure: APPLICATION OF WOUND VAC ( POSSIBLE ) ;  Surgeon: [name], MD;  Location: [facility] ORS;  Service: Vascular;  Laterality: Left;  . APPLICATION OF WOUND VAC Left 09/25/2016   Procedure: APPLICATION OF WOUND VAC;  Surgeon: [name], DPM;  Location: [facility] ORS;  Service: Podiatry;  Laterality: Left;  . arm surgery     right    . CHOLECYSTECTOMY    . COLONOSCOPY    . COLONOSCOPY WITH PROPOFOL N/A 01/11/2017   Procedure: COLONOSCOPY WITH PROPOFOL;  Surgeon: [name], MD;  Location: [facility] ENDOSCOPY;  Service: Endoscopy;  Laterality: N/A;  . COLONOSCOPY WITH PROPOFOL N/A 02/22/2017   Procedure: COLONOSCOPY WITH PROPOFOL;  Surgeon: [name], MD;  Location: [facility] ENDOSCOPY;  Service: Endoscopy;  Laterality: N/A;  . COLONOSCOPY WITH PROPOFOL N/A 05/31/2017   Procedure: COLONOSCOPY WITH PROPOFOL;  Surgeon: [name], MD;  Location: [facility] ENDOSCOPY;  Service: Endoscopy;  Laterality: N/A;  . ESOPHAGOGASTRODUODENOSCOPY (EGD) WITH PROPOFOL N/A 01/11/2017   Procedure: ESOPHAGOGASTRODUODENOSCOPY (EGD) WITH PROPOFOL;  Surgeon: [name], MD;  Location: [facility]  ENDOSCOPY;  Service: Endoscopy;  Laterality: N/A;  . HERNIA REPAIR     umbilical  . HIATAL HERNIA REPAIR    . I&D EXTREMITY Left 06/27/2015   Procedure: IRRIGATION AND DEBRIDEMENT EXTREMITY            ( CALF HEMATOMA ) POSSIBLE WOUND VAC;  Surgeon: [name], MD;  Location: [facility] ORS;  Service: Vascular;  Laterality: Left;  . INCISION AND DRAINAGE OF WOUND Left 09/25/2016   Procedure: IRRIGATION AND DEBRIDEMENT - PARTIAL RESECTION OF ACHILLES TENDON WITH WOUND VAC APPLICATION;  Surgeon: [name], DPM;  Location: [facility] ORS;  Service: Podiatry;  Laterality: Left;  . IRRIGATION AND DEBRIDEMENT ABSCESS Left 09/07/2016   Procedure:  IRRIGATION AND DEBRIDEMENT ABSCESS LEFT HEEL;  Surgeon: [name], DPM;  Location: [facility] ORS;  Service: Podiatry;  Laterality: Left;  . JOINT REPLACEMENT  2013   left knee replacement  . KIDNEY SURGERY Right    kidney stones  . LITHOTRIPSY    . NM [facility] HX)  February 2017   Likely breast attenuation. LOW RISK study. Normal EF 55-60%.  . OTHER SURGICAL HISTORY     08/2016 or 09/2016 surgery on achilles tenso h/o staph infection heal. Dr. [name]  . removal of left hematoma Left    leg  . REVERSE SHOULDER ARTHROPLASTY Right 10/08/2015   Procedure: REVERSE SHOULDER ARTHROPLASTY;  Surgeon: [name], MD;  Location: [facility] ORS;  Service: Orthopedics;  Laterality: Right;  . SLEEVE GASTROPLASTY    . TONSILLECTOMY    . TOTAL KNEE ARTHROPLASTY    . TOTAL SHOULDER REPLACEMENT Right   . TRANSESOPHAGEAL ECHOCARDIOGRAM  08/08/2013   Mild LVH, EF 60-65%. Moderate LA dilation and mild RA dilation. Mild MR with no evidence of stenosis and no evidence of endocarditis. A false chordae is noted.  . TRANSTHORACIC ECHOCARDIOGRAM  08/03/2013   Mild-Moderate concentric LVH, EF 60-65%. Normal diastolic function. Mild LA dilation. Mild MR with possible vegetation  - not confirmed on TEE   . ULNAR NERVE TRANSPOSITION Right 08/06/2016   Procedure: ULNAR NERVE DECOMPRESSION/TRANSPOSITION;  Surgeon: [name], MD;  Location: [facility] ORS;  Service: Orthopedics;  Laterality: Right;  . UPPER GI ENDOSCOPY    . VAGINAL HYSTERECTOMY      There were no vitals filed for this visit.  Subjective Assessment - 09/07/17 1613    Subjective  Patient is doing better and is able to get in and out of bed much better and sometimes needs occassional help but not every time. She is having difficulty with getting in and out of the bath tub, stepping in.     Patient is accompained by:  Family member    Pertinent History  Patient presents with hx of DM, PVD, peripheral neuropathy. In June 2018 patient suffered staph  infection and subsequent necrosis of L Achilles tendon requiring partial resection. Patient has suffered three falls in last six months, including one where she felt a "pop" in the hip while getting out of the bathtub. Went to [facility] two days later and received x-ray, negative; diagnosed with muscle strain and was recommended exercises. ER visit on 1/28 following third fall onto L hip, diagnosed with hematoma. Patient reports reduced sensation in feet L > R. Requires assistance to get into and out of bed since tendon surgery. Patient uses spc for ambulation and reports that small steps do not cause pain.    Limitations  Walking;Sitting;Standing;Lifting;House hold activities    How long can you sit comfortably?  1 hr    How long can you stand comfortably?  n/a    How long can you walk comfortably?  40 min    Diagnostic tests  x-ray, negative, CT scan showed torn muscle    Patient Stated Goals  Improve balance, reduce fall risk, and can go somewhere without FOF    Currently in Pain?  No/denies    Pain Score  0-No pain    Pain Onset  More than a month ago    Pain Onset  1 to 4 weeks ago         Therapeutic activities: Performed outcome measures and reviewed goals with improvements in all goals with gait speed, transfers and functional moblity.  Patient performed transfers in and out of bath tub with mod assist needed to get left leg in and out with use of grab bars.                       PT Education - 09/07/17 1614    Education provided  Yes    Education Details  getting in and out of the bath tub, stepping in and out    Person(s) Educated  Patient    Methods  Explanation;Demonstration;Tactile cues;Verbal cues    Comprehension  Verbalized understanding;Returned demonstration;Verbal cues required       PT Short Term Goals - 08/05/17 1726      PT SHORT TERM GOAL #1   Title  Patient will be independent in home exercise program to improve strength/mobility for better  functional independence with bed mobility and ADLs.    Baseline  No HEP    Time  4    Period  Weeks    Status  On-going    Target Date  08/24/17      PT SHORT TERM GOAL #2   Title  Patient will report a worst pain score of 3/10 VAS in the L hip to improve tolerance with ADLs and reduce symptoms with bed mobility activities.      Baseline  6/10 with medication; 07/06/17 4/10 right hip buttocks 4/10 buttocks pain 08/05/17    Time  4    Period  Weeks    Status  Partially Met    Target Date  08/24/17      PT SHORT TERM GOAL #3   Title  Patient will demonstrate sit to supine bed mobility, supine to sit bed mobility, and rolling L and R with minimal assistance for improved functional mobility and independence    Baseline  Patient requires mod-max A for bed mobility; independent with more time    Time  4    Period  Weeks    Status  Achieved        PT Long Term Goals - 09/07/17 1615      PT LONG TERM GOAL #1   Title  Patient (>104 years old) will improve 10-meter walk test to 0.6 meters/sec with least restrictive AD to demonstrate reduced fall risk and capacity for limited community ambulation.    Baseline  0.21 meters/sec, 05/25/17 = .21 m/sec, . 40 m/sec 06/29/17; 07/06/17;  .40 m/sec  08/05/17 . 65 m/sec, 09/07/08 . 72 m/sec    Time  8    Period  Weeks    Status  Partially Met    Target Date  10/19/17      PT LONG TERM GOAL #2   Title  Patient will improve LEFS score to 40/80 to demonstrate improved functional mobility  and independence with ADLs    Baseline  9/80; 05/25/17= 12/80, 20/80 06/29/17; 07/06/17:29/80 ; 08/05/17  34/80, 09/07/17, 42/80    Time  8    Period  Weeks    Status  On-going    Target Date  10/19/17      PT LONG TERM GOAL #3   Title  Patient will demonstrate ability to perform bed mobility independently for improved functional mobility and independence.    Baseline  Patient is independent with bed mobitiy     Time  8    Period  Weeks    Status  Achieved    Target Date   10/19/17      PT LONG TERM GOAL #4   Title  Patient will increase six minute walk test distance to >1000 for progression to community ambulator and improve gait ability    Baseline  225 ft; 07/06/17:  08/05/17 280 ft , 09/07/17 325 ft    Time  8    Period  Weeks    Status  On-going    Target Date  10/19/17            Plan - 09/07/17 1621    Clinical Impression Statement  Patient presents with decreased BLE strength, decreased gait speed, decreased standing balance.  Patient's main complaint is pain and inability to participate in desired activities.Patient is making progress towards her goals.  Patient will benefit from continued skilled therapy in order to increase LE strength and standing balance and increase gait speed and improve her ability to participate in desired activities, and decrease her falls risk.     Rehab Potential  Fair    Clinical Impairments Affecting Rehab Potential  - comorbidities, age, severity / + family support, acute injury    PT Frequency  2x / week    PT Duration  8 weeks    PT Treatment/Interventions  Aquatic Therapy;ADLs/Self Care Home Management;Cryotherapy;Electrical Stimulation;Moist Heat;Ultrasound;DME Instruction;Gait training;Stair training;Functional mobility training;Therapeutic activities;Therapeutic exercise;Balance training;Neuromuscular re-education;Patient/family education;Manual techniques;Compression bandaging;Scar mobilization;Passive range of motion;Dry needling;Energy conservation;Taping    PT Next Visit Plan  LE strengthening    Consulted and Agree with Plan of Care  Patient       Patient will benefit from skilled therapeutic intervention in order to improve the following deficits and impairments:  Abnormal gait, Decreased activity tolerance, Decreased balance, Decreased endurance, Decreased coordination, Decreased mobility, Decreased range of motion, Decreased safety awareness, Difficulty walking, Decreased strength, Hypomobility, Impaired  perceived functional ability, Impaired UE functional use, Obesity, Impaired flexibility, Pain  Visit Diagnosis: Difficulty in walking, not elsewhere classified - Plan: PT plan of care cert/re-cert  Muscle weakness (generalized) - Plan: PT plan of care cert/re-cert  Pain in left hip - Plan: PT plan of care cert/re-cert     Problem List Patient Active Problem List   Diagnosis Date Noted  . Adrenal mass ([place]) 08/02/2017  . Chronic atrial fibrillation ([place]) 08/02/2017  . Eczema 06/27/2017  . Diabetic ulcer of left foot ([place]) 06/24/2017  . Mass of both adrenal glands ([place]) 05/16/2017  . Fatty liver 05/13/2017  . Strain of right hip 04/30/2017  . Ischial pain, right 04/30/2017  . Pain of left hip joint 04/22/2017  . Abnormality of gait and mobility 04/22/2017  . Muscle strain of left hip 04/09/2017  . Fall with injury 02/02/2017  . Anemia 11/18/2016  . Asthma 11/18/2016  . Carpal tunnel syndrome 11/18/2016  . Pain in joint involving ankle and foot 11/18/2016  . Obstructive sleep apnea of  adult 11/18/2016  . Spinal stenosis of thoracic region 11/18/2016  . Abscess of tendon of left foot 09/21/2016  . Cubital tunnel syndrome on right 08/07/2016  . Chronic pain syndrome 02/25/2016  . Long term current use of opiate analgesic 02/25/2016  . Chronic right shoulder pain 02/25/2016  . Status post reverse total shoulder replacement 10/08/2015  . 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 Morbid obesity with BMI of 40.0-44.9, adult ([place]) 09/23/2015  . Chronic prescription benzodiazepine use 09/23/2015  . Osteoarthritis, multiple sites 09/23/2015  . Other shoulder lesions (Right) 08/28/2015  . Cervical radiculitis 08/06/2015  . Opiate use (15 MME/Day) 01/16/2015  . Impingement syndrome of shoulder region 01/07/2015  . Mixed incontinence 11/26/2014  . Atrophic vaginitis 11/26/2014  . Anxiety and depression 09/21/2014  . Bladder cystocele 09/21/2014  .  Gastro-esophageal reflux disease without esophagitis 09/21/2014  . DM (diabetes mellitus) type II, controlled, with peripheral vascular disorder ([place]) 08/31/2014  . Diabetic peripheral neuropathy associated with type 2 diabetes mellitus ([place]) 08/31/2014  . Asthma, moderate persistent, well-controlled 08/31/2014  . Hypothyroidism, adult 08/31/2014  . Peripheral vascular disease due to secondary diabetes mellitus ([place]) 12/11/2013  . Apnea, sleep 10/19/2013  . Paroxysmal atrial fibrillation ([place]); CHA2DS2-Vasc Score 5. On Pradaxa 07/27/2013  . Essential hypertension 07/27/2013    Alanson Puls, [place] DPT 09/07/2017, 4:46 PM  [place] MAIN [facility] SERVICES [address] [place], [place], [address] Phone: [phone]   Fax:  [phone]  Name: [name] MRN: [id number] Date of Birth: [birth year]

## 2017-09-09 ENCOUNTER — Ambulatory Visit: Payer: PPO | Admitting: Physical Therapy

## 2017-09-10 ENCOUNTER — Other Ambulatory Visit: Payer: Self-pay | Admitting: *Deleted

## 2017-09-10 ENCOUNTER — Telehealth: Payer: Self-pay | Admitting: Nurse Practitioner

## 2017-09-10 DIAGNOSIS — F418 Other specified anxiety disorders: Secondary | ICD-10-CM

## 2017-09-10 MED ORDER — DULOXETINE HCL 60 MG PO CPEP: 60.0000 mg | ORAL_CAPSULE | Freq: Every day | ORAL | 0 refills | Status: DC

## 2017-09-10 NOTE — Telephone Encounter (Signed)
Patient states Duloxotine did not get called in from last visit. Please assist, she is out of meds completely

## 2017-09-10 NOTE — Telephone Encounter (Signed)
Patient notified per voicemail that Cymbaltal script has been sent to pharmacy.

## 2017-09-14 ENCOUNTER — Ambulatory Visit: Payer: PPO | Admitting: Physical Therapy

## 2017-09-14 ENCOUNTER — Encounter: Payer: Self-pay | Admitting: Physical Therapy

## 2017-09-14 DIAGNOSIS — M25552 Pain in left hip: Secondary | ICD-10-CM

## 2017-09-14 DIAGNOSIS — M6281 Muscle weakness (generalized): Secondary | ICD-10-CM

## 2017-09-14 DIAGNOSIS — R262 Difficulty in walking, not elsewhere classified: Secondary | ICD-10-CM | POA: Diagnosis not present

## 2017-09-14 NOTE — Therapy (Signed)
[place] MAIN [facility] SERVICES [address] [place], [place], [address] Phone: [phone]   Fax:  [phone]  Physical Therapy Treatment   Physical Therapy Progress Note   Dates of reporting period  07/06/17  to   09/14/17  Patient Details  Name: [name] MRN: [id number] Date of Birth: [birth year] Referring Provider: Dr. Roland Rack   Encounter Date: 09/14/2017  PT End of Session - 09/14/17 1614    Visit Number  20    Number of Visits  34    Date for PT Re-Evaluation  10/19/17    PT Start Time  0330    PT Stop Time  0358    PT Time Calculation (min)  28 min    Equipment Utilized During Treatment  Gait belt    Activity Tolerance  Patient tolerated treatment well;Patient limited by pain;Patient limited by fatigue    Behavior During Therapy  [facility] for tasks assessed/performed       Past Medical History:  Diagnosis Date  . Abnormal antibody titer   . Anxiety and depression   . Arthritis   . Asthma   . Cystocele   . Depression   . Diabetes ([place])   . DVT (deep venous thrombosis) (HCC)    Righ calf  . Dysrhythmia   . Edema   . GERD (gastroesophageal reflux disease)   . Heart murmur    Echocardiogram June 2015: Mild MR (possible vegetation seen on TTE, not seen on TEE), normal LV size with moderate concentric LVH. Normal function EF 60-65%. Normal diastolic function. Mild LA dilation.  Marland Kitchen History of IBS   . History of methicillin resistant staphylococcus aureus (MRSA) 2008  . Hyperlipidemia   . Hypertension   . Hypothyroidism   . Insomnia   . Kidney stone    kidney stones with lithotripsy  . Neuropathy involving both lower extremities   . Obesity, Class II, BMI 35-39.9, with comorbidity   . Paroxysmal atrial fibrillation ([place]) 2007   In June 2015, Cardiac Event Monitor: Mostly SR/sinus arrhythmia with PVCs that are frequent. Short bursts of A. fib lasting several minutes;; CHA2DS2-VASc Score = 5 (age, Female, PVD, DM, HTN)  .  Peripheral vascular disease ([place])   . Sleep apnea    use C-PAP  . Urinary incontinence   . Venous stasis dermatitis of both lower extremities     Past Surgical History:  Procedure Laterality Date  . ANKLE SURGERY Right   . APPLICATION OF WOUND VAC Left 06/27/2015   Procedure: APPLICATION OF WOUND VAC ( POSSIBLE ) ;  Surgeon: [name], MD;  Location: [facility] ORS;  Service: Vascular;  Laterality: Left;  . APPLICATION OF WOUND VAC Left 09/25/2016   Procedure: APPLICATION OF WOUND VAC;  Surgeon: [name], DPM;  Location: [facility] ORS;  Service: Podiatry;  Laterality: Left;  . arm surgery     right    . CHOLECYSTECTOMY    . COLONOSCOPY    . COLONOSCOPY WITH PROPOFOL N/A 01/11/2017   Procedure: COLONOSCOPY WITH PROPOFOL;  Surgeon: [name], MD;  Location: [facility] ENDOSCOPY;  Service: Endoscopy;  Laterality: N/A;  . COLONOSCOPY WITH PROPOFOL N/A 02/22/2017   Procedure: COLONOSCOPY WITH PROPOFOL;  Surgeon: [name], MD;  Location: [facility] ENDOSCOPY;  Service: Endoscopy;  Laterality: N/A;  . COLONOSCOPY WITH PROPOFOL N/A 05/31/2017   Procedure: COLONOSCOPY WITH PROPOFOL;  Surgeon: [name], MD;  Location: [facility] ENDOSCOPY;  Service: Endoscopy;  Laterality: N/A;  . ESOPHAGOGASTRODUODENOSCOPY (EGD) WITH  PROPOFOL N/A 01/11/2017   Procedure: ESOPHAGOGASTRODUODENOSCOPY (EGD) WITH PROPOFOL;  Surgeon: [name], MD;  Location: [facility] ENDOSCOPY;  Service: Endoscopy;  Laterality: N/A;  . HERNIA REPAIR     umbilical  . HIATAL HERNIA REPAIR    . I&D EXTREMITY Left 06/27/2015   Procedure: IRRIGATION AND DEBRIDEMENT EXTREMITY            ( CALF HEMATOMA ) POSSIBLE WOUND VAC;  Surgeon: [name], MD;  Location: [facility] ORS;  Service: Vascular;  Laterality: Left;  . INCISION AND DRAINAGE OF WOUND Left 09/25/2016   Procedure: IRRIGATION AND DEBRIDEMENT - PARTIAL RESECTION OF ACHILLES TENDON WITH WOUND VAC APPLICATION;  Surgeon: [name], DPM;  Location: [facility] ORS;  Service:  Podiatry;  Laterality: Left;  . IRRIGATION AND DEBRIDEMENT ABSCESS Left 09/07/2016   Procedure: IRRIGATION AND DEBRIDEMENT ABSCESS LEFT HEEL;  Surgeon: [name], DPM;  Location: [facility] ORS;  Service: Podiatry;  Laterality: Left;  . JOINT REPLACEMENT  2013   left knee replacement  . KIDNEY SURGERY Right    kidney stones  . LITHOTRIPSY    . NM [facility] HX)  February 2017   Likely breast attenuation. LOW RISK study. Normal EF 55-60%.  . OTHER SURGICAL HISTORY     08/2016 or 09/2016 surgery on achilles tenso h/o staph infection heal. Dr. [name]  . removal of left hematoma Left    leg  . REVERSE SHOULDER ARTHROPLASTY Right 10/08/2015   Procedure: REVERSE SHOULDER ARTHROPLASTY;  Surgeon: [name], MD;  Location: [facility] ORS;  Service: Orthopedics;  Laterality: Right;  . SLEEVE GASTROPLASTY    . TONSILLECTOMY    . TOTAL KNEE ARTHROPLASTY    . TOTAL SHOULDER REPLACEMENT Right   . TRANSESOPHAGEAL ECHOCARDIOGRAM  08/08/2013   Mild LVH, EF 60-65%. Moderate LA dilation and mild RA dilation. Mild MR with no evidence of stenosis and no evidence of endocarditis. A false chordae is noted.  . TRANSTHORACIC ECHOCARDIOGRAM  08/03/2013   Mild-Moderate concentric LVH, EF 60-65%. Normal diastolic function. Mild LA dilation. Mild MR with possible vegetation  - not confirmed on TEE   . ULNAR NERVE TRANSPOSITION Right 08/06/2016   Procedure: ULNAR NERVE DECOMPRESSION/TRANSPOSITION;  Surgeon: [name], MD;  Location: [facility] ORS;  Service: Orthopedics;  Laterality: Right;  . UPPER GI ENDOSCOPY    . VAGINAL HYSTERECTOMY      There were no vitals filed for this visit.  Subjective Assessment - 09/14/17 1557    Subjective  Patient is able to walk in her lawn and trim her roses. She slipped in the bathrorm and pulled her left inner thigh.     Patient is accompained by:  Family member    Pertinent History  Patient presents with hx of DM, PVD, peripheral neuropathy. In June 2018 patient suffered  staph infection and subsequent necrosis of L Achilles tendon requiring partial resection. Patient has suffered three falls in last six months, including one where she felt a "pop" in the hip while getting out of the bathtub. Went to [facility] two days later and received x-ray, negative; diagnosed with muscle strain and was recommended exercises. ER visit on 1/28 following third fall onto L hip, diagnosed with hematoma. Patient reports reduced sensation in feet L > R. Requires assistance to get into and out of bed since tendon surgery. Patient uses spc for ambulation and reports that small steps do not cause pain.    Limitations  Walking;Sitting;Standing;Lifting;House hold activities    How long can you  sit comfortably?  1 hr    How long can you stand comfortably?  n/a    How long can you walk comfortably?  40 min    Diagnostic tests  x-ray, negative, CT scan showed torn muscle    Patient Stated Goals  Improve balance, reduce fall risk, and can go somewhere without FOF    Currently in Pain?  Yes    Pain Score  3     Pain Location  Leg    Pain Orientation  Left    Pain Descriptors / Indicators  Aching    Pain Type  Acute pain    Pain Radiating Towards  groin    Pain Onset  More than a month ago    Pain Onset  1 to 4 weeks ago       Therapeutic exercises:  Supine marchin hooklying with TA,2x20 with cues for increased hip flexion 2x10 Supine bridge with cues to stabilize pelvis 2x20 Supine SLR 10 x 2  BLE, with 2 lb weights Supine hip abd/add with cues to keep L leg in neutral and not to ER, 3 lb weights x 20  sidelying clamshell 15 x 2  BLE, cue to perform slowly piriforms stretch to right LE x 30 sec x 3 reps  Patient has decreased gait speed but mobility is improving and safety is improving.                      PT Education - 09/14/17 1613    Education provided  Yes    Education Details  HEP    Person(s) Educated  Patient    Methods  Explanation;Demonstration     Comprehension  Verbalized understanding       PT Short Term Goals - 08/05/17 1726      PT SHORT TERM GOAL #1   Title  Patient will be independent in home exercise program to improve strength/mobility for better functional independence with bed mobility and ADLs.    Baseline  No HEP    Time  4    Period  Weeks    Status  On-going    Target Date  08/24/17      PT SHORT TERM GOAL #2   Title  Patient will report a worst pain score of 3/10 VAS in the L hip to improve tolerance with ADLs and reduce symptoms with bed mobility activities.      Baseline  6/10 with medication; 07/06/17 4/10 right hip buttocks 4/10 buttocks pain 08/05/17    Time  4    Period  Weeks    Status  Partially Met    Target Date  08/24/17      PT SHORT TERM GOAL #3   Title  Patient will demonstrate sit to supine bed mobility, supine to sit bed mobility, and rolling L and R with minimal assistance for improved functional mobility and independence    Baseline  Patient requires mod-max A for bed mobility; independent with more time    Time  4    Period  Weeks    Status  Achieved        PT Long Term Goals - 09/07/17 1615      PT LONG TERM GOAL #1   Title  Patient (>65 years old) will improve 10-meter walk test to 0.6 meters/sec with least restrictive AD to demonstrate reduced fall risk and capacity for limited community ambulation.    Baseline  0.21 meters/sec, 05/25/17 = .21 m/sec, .  40 m/sec 06/29/17; 07/06/17;  .40 m/sec  08/05/17 . 65 m/sec, 09/07/08 . 72 m/sec    Time  8    Period  Weeks    Status  Partially Met    Target Date  10/19/17      PT LONG TERM GOAL #2   Title  Patient will improve LEFS score to 40/80 to demonstrate improved functional mobility and independence with ADLs    Baseline  9/80; 05/25/17= 12/80, 20/80 06/29/17; 07/06/17:29/80 ; 08/05/17  34/80, 09/07/17, 42/80    Time  8    Period  Weeks    Status  On-going    Target Date  10/19/17      PT LONG TERM GOAL #3   Title  Patient will demonstrate  ability to perform bed mobility independently for improved functional mobility and independence.    Baseline  Patient is independent with bed mobitiy     Time  8    Period  Weeks    Status  Achieved    Target Date  10/19/17      PT LONG TERM GOAL #4   Title  Patient will increase six minute walk test distance to >1000 for progression to community ambulator and improve gait ability    Baseline  225 ft; 07/06/17:  08/05/17 280 ft , 09/07/17 325 ft    Time  8    Period  Weeks    Status  On-going    Target Date  10/19/17            Plan - 09/14/17 1614    Clinical Impression Statement  Patient's condition has the potential to improve in response to therapy. Maximum improvement is yet to be obtained. The anticipated improvement is attainable and reasonable in a generally predictable time. Start date of reporting period 07/06/17 end date of reporting period 08/14/17 . Patient reports that she is now able to get in and out of bed independently, needs a minimal amount of help to get in and out to the bathtub, is able to go out into her yard. Pt is making improvements in BLE strength and balance as evidenced by improvements in transfers and gait.  Patient is able to use the rollator more and home and increased  gait speed. Patient  demonstrates instability with posterior ambulation. Patient will benefit from continued skilled PT interventions for improved strength, balance, and QOL.   Rehab Potential  Fair    Clinical Impairments Affecting Rehab Potential  - comorbidities, age, severity / + family support, acute injury    PT Frequency  2x / week    PT Duration  8 weeks    PT Treatment/Interventions  Aquatic Therapy;ADLs/Self Care Home Management;Cryotherapy;Electrical Stimulation;Moist Heat;Ultrasound;DME Instruction;Gait training;Stair training;Functional mobility training;Therapeutic activities;Therapeutic exercise;Balance training;Neuromuscular re-education;Patient/family education;Manual  techniques;Compression bandaging;Scar mobilization;Passive range of motion;Dry needling;Energy conservation;Taping    PT Next Visit Plan  LE strengthening    Consulted and Agree with Plan of Care  Patient       Patient will benefit from skilled therapeutic intervention in order to improve the following deficits and impairments:  Abnormal gait, Decreased activity tolerance, Decreased balance, Decreased endurance, Decreased coordination, Decreased mobility, Decreased range of motion, Decreased safety awareness, Difficulty walking, Decreased strength, Hypomobility, Impaired perceived functional ability, Impaired UE functional use, Obesity, Impaired flexibility, Pain  Visit Diagnosis: Difficulty in walking, not elsewhere classified  Muscle weakness (generalized)  Pain in left hip     Problem List Patient Active Problem List   Diagnosis Date Noted  .  Adrenal mass ([place]) 08/02/2017  . Chronic atrial fibrillation ([place]) 08/02/2017  . Eczema 06/27/2017  . Diabetic ulcer of left foot ([place]) 06/24/2017  . Mass of both adrenal glands ([place]) 05/16/2017  . Fatty liver 05/13/2017  . Strain of right hip 04/30/2017  . Ischial pain, right 04/30/2017  . Pain of left hip joint 04/22/2017  . Abnormality of gait and mobility 04/22/2017  . Muscle strain of left hip 04/09/2017  . Fall with injury 02/02/2017  . Anemia 11/18/2016  . Asthma 11/18/2016  . Carpal tunnel syndrome 11/18/2016  . Pain in joint involving ankle and foot 11/18/2016  . Obstructive sleep apnea of adult 11/18/2016  . Spinal stenosis of thoracic region 11/18/2016  . Abscess of tendon of left foot 09/21/2016  . Cubital tunnel syndrome on right 08/07/2016  . Chronic pain syndrome 02/25/2016  . Long term current use of opiate analgesic 02/25/2016  . Chronic right shoulder pain 02/25/2016  . Status post reverse total shoulder replacement 10/08/2015  . 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 Morbid obesity  with BMI of 40.0-44.9, adult ([place]) 09/23/2015  . Chronic prescription benzodiazepine use 09/23/2015  . Osteoarthritis, multiple sites 09/23/2015  . Other shoulder lesions (Right) 08/28/2015  . Cervical radiculitis 08/06/2015  . Opiate use (15 MME/Day) 01/16/2015  . Impingement syndrome of shoulder region 01/07/2015  . Mixed incontinence 11/26/2014  . Atrophic vaginitis 11/26/2014  . Anxiety and depression 09/21/2014  . Bladder cystocele 09/21/2014  . Gastro-esophageal reflux disease without esophagitis 09/21/2014  . DM (diabetes mellitus) type II, controlled, with peripheral vascular disorder ([place]) 08/31/2014  . Diabetic peripheral neuropathy associated with type 2 diabetes mellitus ([place]) 08/31/2014  . Asthma, moderate persistent, well-controlled 08/31/2014  . Hypothyroidism, adult 08/31/2014  . Peripheral vascular disease due to secondary diabetes mellitus ([place]) 12/11/2013  . Apnea, sleep 10/19/2013  . Paroxysmal atrial fibrillation ([place]); CHA2DS2-Vasc Score 5. On Pradaxa 07/27/2013  . Essential hypertension 07/27/2013    Arelia Sneddon S,PT DPT 09/14/2017, 4:16 PM  [place] MAIN [facility] SERVICES [address] [place], [place], [address] Phone: [phone]   Fax:  [phone]  Name: Ryiah Bellissimo MRN: [id number] Date of Birth: 11-Aug-1950

## 2017-09-15 ENCOUNTER — Encounter: Payer: Self-pay | Admitting: Obstetrics and Gynecology

## 2017-09-15 ENCOUNTER — Ambulatory Visit: Payer: PPO | Admitting: Obstetrics and Gynecology

## 2017-09-15 VITALS — BP 174/82 | HR 67 | Ht 65.0 in | Wt 264.8 lb

## 2017-09-15 DIAGNOSIS — Z90710 Acquired absence of both cervix and uterus: Secondary | ICD-10-CM

## 2017-09-15 DIAGNOSIS — R309 Painful micturition, unspecified: Secondary | ICD-10-CM | POA: Diagnosis not present

## 2017-09-15 DIAGNOSIS — Z6841 Body Mass Index (BMI) 40.0 and over, adult: Secondary | ICD-10-CM | POA: Diagnosis not present

## 2017-09-15 DIAGNOSIS — N3946 Mixed incontinence: Secondary | ICD-10-CM

## 2017-09-15 LAB — POCT URINALYSIS DIPSTICK
GLUCOSE UA: NEGATIVE
Ketones, UA: NEGATIVE
Nitrite, UA: NEGATIVE
Odor: NEGATIVE
Protein, UA: POSITIVE — AB
Spec Grav, UA: >=1.03 — AB (ref 1.010–1.025)
Urobilinogen, UA: 0.2 E.U./dL
pH, UA: 6 (ref 5.0–8.0)

## 2017-09-15 MED ORDER — NITROFURANTOIN MONOHYD MACRO 100 MG PO CAPS: 100.0000 mg | ORAL_CAPSULE | Freq: Two times a day (BID) | ORAL | 0 refills | Status: DC

## 2017-09-15 NOTE — Progress Notes (Signed)
GYN ENCOUNTER NOTE  Subjective:       [name] is a 67 y.o. No obstetric history on file. female is here for gynecologic evaluation of the following issues:  1.  Mixed incontinence  Status post TVH A&P repair 25 years ago.   Now with mixed incontinence, treated with Myrbetriq and oxybutynin at night; still has urgency symptoms and leaks urine when attempting to get to bathroom. Has SUI symptoms with leaking urine with coughing sneezing lifting and laughing. Urinary frequency 6-8 Nocturia 0 No significant pelvic pressure or prolapse symptoms   Gynecologic History No LMP recorded. Patient has had a hysterectomy. Status post TVH A&P repair-1997  Obstetric History OB History  No data available    Past Medical History:  Diagnosis Date  . Abnormal antibody titer   . Anxiety and depression   . Arthritis   . Asthma   . Cystocele   . Depression   . Diabetes ([place])   . DVT (deep venous thrombosis) (HCC)    Righ calf  . Dysrhythmia   . Edema   . GERD (gastroesophageal reflux disease)   . Heart murmur    Echocardiogram June 2015: Mild MR (possible vegetation seen on TTE, not seen on TEE), normal LV size with moderate concentric LVH. Normal function EF 60-65%. Normal diastolic function. Mild LA dilation.  Marland Kitchen History of IBS   . History of methicillin resistant staphylococcus aureus (MRSA) 2008  . Hyperlipidemia   . Hypertension   . Hypothyroidism   . Insomnia   . Kidney stone    kidney stones with lithotripsy  . Neuropathy involving both lower extremities   . Obesity, Class II, BMI 35-39.9, with comorbidity   . Paroxysmal atrial fibrillation ([place]) 2007   In June 2015, Cardiac Event Monitor: Mostly SR/sinus arrhythmia with PVCs that are frequent. Short bursts of A. fib lasting several minutes;; CHA2DS2-VASc Score = 5 (age, Female, PVD, DM, HTN)  . Peripheral vascular disease ([place])   . Sleep apnea    use C-PAP  . Urinary incontinence   . Venous stasis dermatitis of both  lower extremities     Past Surgical History:  Procedure Laterality Date  . ANKLE SURGERY Right   . APPLICATION OF WOUND VAC Left 06/27/2015   Procedure: APPLICATION OF WOUND VAC ( POSSIBLE ) ;  Surgeon: [name], MD;  Location: [facility] ORS;  Service: Vascular;  Laterality: Left;  . APPLICATION OF WOUND VAC Left 09/25/2016   Procedure: APPLICATION OF WOUND VAC;  Surgeon: [name], DPM;  Location: [facility] ORS;  Service: Podiatry;  Laterality: Left;  . arm surgery     right    . CHOLECYSTECTOMY    . COLONOSCOPY    . COLONOSCOPY WITH PROPOFOL N/A 01/11/2017   Procedure: COLONOSCOPY WITH PROPOFOL;  Surgeon: [name], MD;  Location: [facility] ENDOSCOPY;  Service: Endoscopy;  Laterality: N/A;  . COLONOSCOPY WITH PROPOFOL N/A 02/22/2017   Procedure: COLONOSCOPY WITH PROPOFOL;  Surgeon: [name], MD;  Location: [facility] ENDOSCOPY;  Service: Endoscopy;  Laterality: N/A;  . COLONOSCOPY WITH PROPOFOL N/A 05/31/2017   Procedure: COLONOSCOPY WITH PROPOFOL;  Surgeon: [name], MD;  Location: [facility] ENDOSCOPY;  Service: Endoscopy;  Laterality: N/A;  . ESOPHAGOGASTRODUODENOSCOPY (EGD) WITH PROPOFOL N/A 01/11/2017   Procedure: ESOPHAGOGASTRODUODENOSCOPY (EGD) WITH PROPOFOL;  Surgeon: [name], MD;  Location: [facility] ENDOSCOPY;  Service: Endoscopy;  Laterality: N/A;  . HERNIA REPAIR     umbilical  . HIATAL HERNIA REPAIR    .  I&D EXTREMITY Left 06/27/2015   Procedure: IRRIGATION AND DEBRIDEMENT EXTREMITY            ( CALF HEMATOMA ) POSSIBLE WOUND VAC;  Surgeon: [name], MD;  Location: [facility] ORS;  Service: Vascular;  Laterality: Left;  . INCISION AND DRAINAGE OF WOUND Left 09/25/2016   Procedure: IRRIGATION AND DEBRIDEMENT - PARTIAL RESECTION OF ACHILLES TENDON WITH WOUND VAC APPLICATION;  Surgeon: [name], DPM;  Location: [facility] ORS;  Service: Podiatry;  Laterality: Left;  . IRRIGATION AND DEBRIDEMENT ABSCESS Left 09/07/2016   Procedure: IRRIGATION AND DEBRIDEMENT ABSCESS  LEFT HEEL;  Surgeon: [name], DPM;  Location: [facility] ORS;  Service: Podiatry;  Laterality: Left;  . JOINT REPLACEMENT  2013   left knee replacement  . KIDNEY SURGERY Right    kidney stones  . LITHOTRIPSY    . NM [facility] HX)  February 2017   Likely breast attenuation. LOW RISK study. Normal EF 55-60%.  . OTHER SURGICAL HISTORY     08/2016 or 09/2016 surgery on achilles tenso h/o staph infection heal. Dr. [name]  . removal of left hematoma Left    leg  . REVERSE SHOULDER ARTHROPLASTY Right 10/08/2015   Procedure: REVERSE SHOULDER ARTHROPLASTY;  Surgeon: [name], MD;  Location: [facility] ORS;  Service: Orthopedics;  Laterality: Right;  . SLEEVE GASTROPLASTY    . TONSILLECTOMY    . TOTAL KNEE ARTHROPLASTY    . TOTAL SHOULDER REPLACEMENT Right   . TRANSESOPHAGEAL ECHOCARDIOGRAM  08/08/2013   Mild LVH, EF 60-65%. Moderate LA dilation and mild RA dilation. Mild MR with no evidence of stenosis and no evidence of endocarditis. A false chordae is noted.  . TRANSTHORACIC ECHOCARDIOGRAM  08/03/2013   Mild-Moderate concentric LVH, EF 60-65%. Normal diastolic function. Mild LA dilation. Mild MR with possible vegetation  - not confirmed on TEE   . ULNAR NERVE TRANSPOSITION Right 08/06/2016   Procedure: ULNAR NERVE DECOMPRESSION/TRANSPOSITION;  Surgeon: [name], MD;  Location: [facility] ORS;  Service: Orthopedics;  Laterality: Right;  . UPPER GI ENDOSCOPY    . VAGINAL HYSTERECTOMY      Current Outpatient Medications on File Prior to Visit  Medication Sig Dispense Refill  . albuterol (PROVENTIL HFA;VENTOLIN HFA) 108 (90 Base) MCG/ACT inhaler Inhale 1-2 puffs into the lungs every 4 (four) hours as needed for wheezing or shortness of breath. (Patient taking differently: Inhale 2 puffs into the lungs every 4 (four) hours as needed for wheezing or shortness of breath. ) 8 g 2  . albuterol (PROVENTIL) (2.5 MG/3ML) 0.083% nebulizer solution Take 3 mLs (2.5 mg total) by nebulization every 6  (six) hours as needed for wheezing or shortness of breath. 150 mL 1  . ALPRAZolam (XANAX) 0.5 MG tablet Take 1 tablet (0.5 mg total) by mouth 2 (two) times daily as needed for anxiety. 60 tablet 2  . amLODipine (NORVASC) 5 MG tablet Take 1 tablet (5 mg total) by mouth daily. 90 tablet 3  . buPROPion (WELLBUTRIN XL) 300 MG 24 hr tablet Take 1 tablet (300 mg total) by mouth daily. 30 tablet 11  . CALCIUM ALGINATE EX Apply topically.    . clobetasol cream (TEMOVATE) 2.50 % Apply 1 application topically 2 (two) times daily. To arms 60 g 0  . cyanocobalamin 500 MCG tablet Take 500 mcg by mouth daily.    . dabigatran (PRADAXA) 150 MG CAPS capsule Take 1 capsule (150 mg total) by mouth 2 (two) times daily. 180 capsule 2  . dexlansoprazole (  DEXILANT) 60 MG capsule Take 1 capsule (60 mg total) by mouth daily. Minutes before meal (lunch) 90 capsule 1  . DULoxetine (CYMBALTA) 60 MG capsule Take 1 capsule (60 mg total) by mouth daily. 90 capsule 0  . estradiol (ESTRACE) 0.1 MG/GM vaginal cream Place 1 Applicatorful vaginally 3 (three) times a week. 42.5 g 12  . Ferrous Gluconate 324 (37.5 Fe) MG TABS Take 1 tablet (324 mg total) by mouth daily with lunch. With food 90 tablet 3  . ferrous sulfate 325 (65 FE) MG tablet Take 325 mg by mouth daily with breakfast.    . fluconazole (DIFLUCAN) 150 MG tablet     . Fluticasone-Salmeterol (ADVAIR) 250-50 MCG/DOSE AEPB Inhale 1 puff into the lungs 2 (two) times daily.    Marland Kitchen gabapentin (NEURONTIN) 800 MG tablet Take 1 tablet (800 mg total) by mouth 4 (four) times daily. 360 tablet 0  . [START ON 11/09/2017] HYDROcodone-acetaminophen (NORCO/VICODIN) 5-325 MG tablet Take 1 tablet by mouth every 8 (eight) hours as needed for severe pain. 90 tablet 0  . [START ON 10/10/2017] HYDROcodone-acetaminophen (NORCO/VICODIN) 5-325 MG tablet Take 1 tablet by mouth every 8 (eight) hours as needed for severe pain. 90 tablet 0  . HYDROcodone-acetaminophen (NORCO/VICODIN) 5-325 MG tablet Take  1 tablet by mouth every 8 (eight) hours as needed for severe pain. 90 tablet 0  . levocetirizine (XYZAL) 5 MG tablet TAKE 1 TABLET BY MOUTH EVERY EVENING 90 tablet 3  . levothyroxine (SYNTHROID, LEVOTHROID) 175 MCG tablet Take 1 tablet (175 mcg total) by mouth daily before breakfast. 90 tablet 3  . metFORMIN (GLUCOPHAGE) 1000 MG tablet TAKE ONE TABLET BY MOUTH TWICE DAILY (Patient taking differently: TAKE ONE TABLET IN THE MORNING AND HALF TABLET AT NIGHT) 180 tablet 1  . metoprolol succinate (TOPROL-XL) 100 MG 24 hr tablet Take 1 tablet (100 mg total) by mouth daily. Take with or immediately following a meal. 90 tablet 0  . mirabegron ER (MYRBETRIQ) 50 MG TB24 tablet Take 1 tablet (50 mg total) by mouth daily. 30 tablet 0  . montelukast (SINGULAIR) 10 MG tablet TAKE ONE TABLET BY MOUTH EVERY DAY 90 tablet 3  . Multiple Vitamin (MULTIVITAMIN WITH MINERALS) TABS tablet Take 1 tablet by mouth daily.    . ondansetron (ZOFRAN) 4 MG tablet Take 1 tablet (4 mg total) every 8 (eight) hours as needed by mouth for nausea or vomiting. 20 tablet 0  . oxybutynin (DITROPAN XL) 15 MG 24 hr tablet TAKE ONE TABLET BY MOUTH AT BEDTIME 90 tablet 3  . potassium chloride (K-DUR,KLOR-CON) 10 MEQ tablet Take 10 mEq by mouth 2 (two) times daily.    . quinapril (ACCUPRIL) 40 MG tablet Take 1 tablet (40 mg total) by mouth daily. 90 tablet 1  . triamcinolone cream (KENALOG) 0.1 % Apply 1 application 2 (two) times daily as needed topically. 45 g 1  . zinc oxide 20 % ointment Apply 1 application topically as needed for irritation. 56.7 g 0   No current facility-administered medications on file prior to visit.     Allergies  Allergen Reactions  . Penicillins Hives, Shortness Of Breath and Swelling    Facial swelling Has patient had a PCN reaction causing immediate rash, facial/tongue/throat swelling, SOB or lightheadedness with hypotension: Yes Has patient had a PCN reaction causing severe rash involving mucus membranes  or skin necrosis: Yes Has patient had a PCN reaction that required hospitalization No Has patient had a PCN reaction occurring within the last 10 years:  No If all of the above answers are "NO", then may proceed with Cephalosporin use.   . Aspirin Hives  . Morphine And Related Other (See Comments)    Patient becomes very confused  . Terramycin [Oxytetracycline] Hives    Social History   Socioeconomic History  . Marital status: Married    Spouse name: Not on file  . Number of children: Not on file  . Years of education: Not on file  . Highest education level: Not on file  Occupational History  . Not on file  Social Needs  . Financial resource strain: Not on file  . Food insecurity:    Worry: Not on file    Inability: Not on file  . Transportation needs:    Medical: Not on file    Non-medical: Not on file  Tobacco Use  . Smoking status: Never Smoker  . Smokeless tobacco: Never Used  Substance and Sexual Activity  . Alcohol use: No    Alcohol/week: 0.0 oz  . Drug use: No  . Sexual activity: Yes  Lifestyle  . Physical activity:    Days per week: Not on file    Minutes per session: Not on file  . Stress: Not on file  Relationships  . Social connections:    Talks on phone: Not on file    Gets together: Not on file    Attends religious service: Not on file    Active member of club or organization: Not on file    Attends meetings of clubs or organizations: Not on file    Relationship status: Not on file  . Intimate partner violence:    Fear of current or ex partner: Not on file    Emotionally abused: Not on file    Physically abused: Not on file    Forced sexual activity: Not on file  Other Topics Concern  . Not on file  Social History Narrative   She is currently married -- for 28 years. Does not work. Does not smoke or take alcohol. She never smoked. She exercises at least 3 days a week since before her gastric surgery.   Marital status reviewed in history of  present illness.   She has children and grandchildren     Family History  Problem Relation Age of Onset  . Skin cancer Father   . Diabetes Father   . Hypertension Father   . Peripheral vascular disease Father   . Cancer Father   . Cerebral aneurysm Father   . Alcohol abuse Father   . Varicose Veins Mother   . Kidney disease Mother   . Arthritis Mother   . Mental illness Sister   . Cancer Maternal Aunt        breast  . Breast cancer Maternal Aunt   . Arthritis Maternal Grandmother   . Hypertension Maternal Grandmother   . Diabetes Maternal Grandmother   . Arthritis Maternal Grandfather   . Heart disease Maternal Grandfather   . Hypertension Maternal Grandfather   . Arthritis Paternal Grandmother   . Hypertension Paternal Grandmother   . Diabetes Paternal Grandmother   . Arthritis Paternal Grandfather   . Hypertension Paternal Grandfather   . Bladder Cancer Neg Hx   . Kidney cancer Neg Hx     The following portions of the patient's history were reviewed and updated as appropriate: allergies, current medications, past family history, past medical history, past social history, past surgical history and problem list.  Review of Systems Review of  Systems -comprehensive review of systems is negative except for that noted in the HPI   Objective:   BP (!) 174/82   Pulse 67   Ht 5\' 5"  (1.651 m)   Wt 264 lb 12.8 oz (120.1 kg)   BMI 44.07 kg/m  CONSTITUTIONAL: Well-developed, well-nourished obese female in no acute distress.  HENT:  Normocephalic, atraumatic.  NECK: Not examined SKIN: Skin is warm and dry. No rash noted. Not diaphoretic. No erythema. No pallor. [place]: Alert and oriented to person, place, and time.  PSYCHIATRIC: Normal mood and affect. Normal behavior. Normal judgment and thought content. CARDIOVASCULAR:Not Examined RESPIRATORY: Not Examined BREASTS: Not Examined ABDOMEN: Soft, non distended; Non tender.  No Organomegaly.  Moderate pannus  present PELVIC:  External Genitalia: Normal  BUS: Normal  Vagina: Normal introitus; fair estrogen effect; no significant cystocele; no rectocele; narrowed vaginal vault with postsurgical scarring  Cervix: Surgically absent  Uterus: Surgically absent  Adnexa: Normal; nonpalpable nontender  RV: Normal external exam  Bladder: Nontender MUSCULOSKELETAL: Normal range of motion. No tenderness.  No cyanosis, clubbing, or edema.  PROCEDURE: Pessary fitting  #2 ring with diaphragm support pessary-2 large  #0 incontinence ring-ineffective, not tolerated   Assessment:   1.  Mixed incontinence; urge incontinence suboptimally controlled with both Myrbetriq 50 mg a day and oxybutynin at night 2.  Persistent stress urinary incontinence 3.  Status post TVH A&P repair with no significant pelvic floor defects on exam; vagina is narrowed with postsurgical scarring. 4.  Unsuccessful pessary fitting   Plan:   1.  Pessary fitting as noted-unsuccessful 2.  Continue using Estrace cream intravaginal twice weekly 3.  Consider trial of Poise Imprerssa bladder supports. 4.  Recommend timed voiding every 60 to 90 minutes to keep bladder volumes low 5.  If nonsurgical interventions are unsuccessful, consider pubovaginal sling by urology.  A total of 30 minutes were spent face-to-face with the patient during the encounter with greater than 50% dealing with counseling and coordination of care.  Brayton Mars, MD  Note: This dictation was prepared with Dragon dictation along with smaller phrase technology. Any transcriptional errors that result from this process are unintentional.

## 2017-09-15 NOTE — Patient Instructions (Signed)
1.  Unsuccessful pessary fitting today 2.  Recommend timed voiding every 60 to 90 minutes to keep bladder volume low 3.  Continue using Myrbetriq and oxybutynin as previously prescribed 4.  Continue using Estrace cream intravaginal twice a week 5.  Recommend trial of poise impressa bladder supports-sizing kit 6.  Follow-up with urology( if the poise inserts do not work) for consideration of a possible bladder sling

## 2017-09-16 ENCOUNTER — Telehealth: Payer: Self-pay | Admitting: Obstetrics and Gynecology

## 2017-09-16 ENCOUNTER — Ambulatory Visit: Payer: PPO | Admitting: Physical Therapy

## 2017-09-16 MED ORDER — FLUCONAZOLE 150 MG PO TABS: 150.0000 mg | ORAL_TABLET | ORAL | 1 refills | Status: DC

## 2017-09-16 NOTE — Telephone Encounter (Signed)
LM with Pts husband med erx.

## 2017-09-16 NOTE — Telephone Encounter (Signed)
The patient called and stated that she forgot to let her provider and nurse know that she needs Diflucan sent into her pharmacy ([place]). Please advise.

## 2017-09-17 DIAGNOSIS — G4733 Obstructive sleep apnea (adult) (pediatric): Secondary | ICD-10-CM | POA: Diagnosis not present

## 2017-09-17 LAB — URINE CULTURE

## 2017-09-21 ENCOUNTER — Ambulatory Visit: Payer: PPO | Attending: Surgery | Admitting: Physical Therapy

## 2017-09-21 DIAGNOSIS — E1143 Type 2 diabetes mellitus with diabetic autonomic (poly)neuropathy: Secondary | ICD-10-CM | POA: Diagnosis not present

## 2017-09-21 DIAGNOSIS — M25552 Pain in left hip: Secondary | ICD-10-CM | POA: Diagnosis not present

## 2017-09-21 DIAGNOSIS — R262 Difficulty in walking, not elsewhere classified: Secondary | ICD-10-CM | POA: Diagnosis not present

## 2017-09-21 DIAGNOSIS — L97521 Non-pressure chronic ulcer of other part of left foot limited to breakdown of skin: Secondary | ICD-10-CM | POA: Diagnosis not present

## 2017-09-21 DIAGNOSIS — M6281 Muscle weakness (generalized): Secondary | ICD-10-CM | POA: Diagnosis not present

## 2017-09-22 ENCOUNTER — Encounter: Payer: Self-pay | Admitting: Physical Therapy

## 2017-09-22 NOTE — Therapy (Signed)
[place] MAIN [facility] SERVICES [address] [place], [place], [address] Phone: [phone]   Fax:  [phone]  Physical Therapy Treatment  Patient Details  Name: [name] MRN: [id number] Date of Birth: 1950-07-31 Referring Provider: Dr. Roland Rack   Encounter Date: 09/21/2017  PT End of Session - 09/22/17 0955    Visit Number  21    Number of Visits  34    Date for PT Re-Evaluation  10/19/17    PT Start Time  0445    PT Stop Time  0530    PT Time Calculation (min)  45 min    Equipment Utilized During Treatment  Gait belt    Activity Tolerance  Patient tolerated treatment well;Patient limited by pain;Patient limited by fatigue    Behavior During Therapy  [facility] for tasks assessed/performed       Past Medical History:  Diagnosis Date  . Abnormal antibody titer   . Anxiety and depression   . Arthritis   . Asthma   . Cystocele   . Depression   . Diabetes ([place])   . DVT (deep venous thrombosis) (HCC)    Righ calf  . Dysrhythmia   . Edema   . GERD (gastroesophageal reflux disease)   . Heart murmur    Echocardiogram June 2015: Mild MR (possible vegetation seen on TTE, not seen on TEE), normal LV size with moderate concentric LVH. Normal function EF 60-65%. Normal diastolic function. Mild LA dilation.  Marland Kitchen History of IBS   . History of methicillin resistant staphylococcus aureus (MRSA) 2008  . Hyperlipidemia   . Hypertension   . Hypothyroidism   . Insomnia   . Kidney stone    kidney stones with lithotripsy  . Neuropathy involving both lower extremities   . Obesity, Class II, BMI 35-39.9, with comorbidity   . Paroxysmal atrial fibrillation ([place]) 2007   In June 2015, Cardiac Event Monitor: Mostly SR/sinus arrhythmia with PVCs that are frequent. Short bursts of A. fib lasting several minutes;; CHA2DS2-VASc Score = 5 (age, Female, PVD, DM, HTN)  . Peripheral vascular disease ([place])   . Sleep apnea    use C-PAP  . Urinary incontinence   .  Venous stasis dermatitis of both lower extremities     Past Surgical History:  Procedure Laterality Date  . ANKLE SURGERY Right   . APPLICATION OF WOUND VAC Left 06/27/2015   Procedure: APPLICATION OF WOUND VAC ( POSSIBLE ) ;  Surgeon: [name], MD;  Location: [facility] ORS;  Service: Vascular;  Laterality: Left;  . APPLICATION OF WOUND VAC Left 09/25/2016   Procedure: APPLICATION OF WOUND VAC;  Surgeon: [name], DPM;  Location: [facility] ORS;  Service: Podiatry;  Laterality: Left;  . arm surgery     right    . CHOLECYSTECTOMY    . COLONOSCOPY    . COLONOSCOPY WITH PROPOFOL N/A 01/11/2017   Procedure: COLONOSCOPY WITH PROPOFOL;  Surgeon: [name], MD;  Location: [facility] ENDOSCOPY;  Service: Endoscopy;  Laterality: N/A;  . COLONOSCOPY WITH PROPOFOL N/A 02/22/2017   Procedure: COLONOSCOPY WITH PROPOFOL;  Surgeon: [name], MD;  Location: [facility] ENDOSCOPY;  Service: Endoscopy;  Laterality: N/A;  . COLONOSCOPY WITH PROPOFOL N/A 05/31/2017   Procedure: COLONOSCOPY WITH PROPOFOL;  Surgeon: [name], MD;  Location: [facility] ENDOSCOPY;  Service: Endoscopy;  Laterality: N/A;  . ESOPHAGOGASTRODUODENOSCOPY (EGD) WITH PROPOFOL N/A 01/11/2017   Procedure: ESOPHAGOGASTRODUODENOSCOPY (EGD) WITH PROPOFOL;  Surgeon: [name], MD;  Location: [facility]  ENDOSCOPY;  Service: Endoscopy;  Laterality: N/A;  . HERNIA REPAIR     umbilical  . HIATAL HERNIA REPAIR    . I&D EXTREMITY Left 06/27/2015   Procedure: IRRIGATION AND DEBRIDEMENT EXTREMITY            ( CALF HEMATOMA ) POSSIBLE WOUND VAC;  Surgeon: [name], MD;  Location: [facility] ORS;  Service: Vascular;  Laterality: Left;  . INCISION AND DRAINAGE OF WOUND Left 09/25/2016   Procedure: IRRIGATION AND DEBRIDEMENT - PARTIAL RESECTION OF ACHILLES TENDON WITH WOUND VAC APPLICATION;  Surgeon: [name], DPM;  Location: [facility] ORS;  Service: Podiatry;  Laterality: Left;  . IRRIGATION AND DEBRIDEMENT ABSCESS Left 09/07/2016   Procedure:  IRRIGATION AND DEBRIDEMENT ABSCESS LEFT HEEL;  Surgeon: [name], DPM;  Location: [facility] ORS;  Service: Podiatry;  Laterality: Left;  . JOINT REPLACEMENT  2013   left knee replacement  . KIDNEY SURGERY Right    kidney stones  . LITHOTRIPSY    . [facility] HX)  February 2017   Likely breast attenuation. LOW RISK study. Normal EF 55-60%.  . OTHER SURGICAL HISTORY     08/2016 or 09/2016 surgery on achilles tenso h/o staph infection heal. Dr. [name]  . removal of left hematoma Left    leg  . REVERSE SHOULDER ARTHROPLASTY Right 10/08/2015   Procedure: REVERSE SHOULDER ARTHROPLASTY;  Surgeon: [name], MD;  Location: [facility] ORS;  Service: Orthopedics;  Laterality: Right;  . SLEEVE GASTROPLASTY    . TONSILLECTOMY    . TOTAL KNEE ARTHROPLASTY    . TOTAL SHOULDER REPLACEMENT Right   . TRANSESOPHAGEAL ECHOCARDIOGRAM  08/08/2013   Mild LVH, EF 60-65%. Moderate LA dilation and mild RA dilation. Mild MR with no evidence of stenosis and no evidence of endocarditis. A false chordae is noted.  . TRANSTHORACIC ECHOCARDIOGRAM  08/03/2013   Mild-Moderate concentric LVH, EF 60-65%. Normal diastolic function. Mild LA dilation. Mild MR with possible vegetation  - not confirmed on TEE   . ULNAR NERVE TRANSPOSITION Right 08/06/2016   Procedure: ULNAR NERVE DECOMPRESSION/TRANSPOSITION;  Surgeon: [name], MD;  Location: [facility] ORS;  Service: Orthopedics;  Laterality: Right;  . UPPER GI ENDOSCOPY    . VAGINAL HYSTERECTOMY      There were no vitals filed for this visit.  Subjective Assessment - 09/22/17 0954    Subjective  Patient is able to walk in her lawn and trim her roses. She slipped in the bathrorm and pulled her left inner thigh.     Patient is accompained by:  Family member    Pertinent History  Patient presents with hx of DM, PVD, peripheral neuropathy. In June 2018 patient suffered staph infection and subsequent necrosis of L Achilles tendon requiring partial resection. Patient  has suffered three falls in last six months, including one where she felt a "pop" in the hip while getting out of the bathtub. Went to [facility] two days later and received x-ray, negative; diagnosed with muscle strain and was recommended exercises. ER visit on 1/28 following third fall onto L hip, diagnosed with hematoma. Patient reports reduced sensation in feet L > R. Requires assistance to get into and out of bed since tendon surgery. Patient uses spc for ambulation and reports that small steps do not cause pain.    Limitations  Walking;Sitting;Standing;Lifting;House hold activities    How long can you sit comfortably?  1 hr    How long can you stand comfortably?  n/a  How long can you walk comfortably?  40 min    Diagnostic tests  x-ray, negative, CT scan showed torn muscle    Patient Stated Goals  Improve balance, reduce fall risk, and can go somewhere without FOF    Currently in Pain?  No/denies    Pain Score  0-No pain    Pain Onset  More than a month ago    Pain Onset  1 to 4 weeks ago       Therapeutic exercises: Supine marchin hooklying with TA,2x20 with cues for increased hip flexion 2x10 Supine bridge with cues to stabilize pelvis 2x20 Supine SLR 10 x 2  BLE, with 2 lb weights Supine hip abd/add with cues to keep L leg in neutral and not to ER, 3 lb weights x 20  sidelying clamshell 15 x 2  BLE, cue to perform slowly piriforms stretch to right LE x 30 sec x 3 reps Standing hip abd x 15 BLE Standing hip ext x 15 BLE Hooklying abd/ER x 15  Seated LAQ x 15 BLE Seated hip flex x 15 BLE Patient has decreased gait speed but mobility is improving and safety is improving.    Patient needs occasional verbal cueing to improve posture and cueing to correctly perform exercises slowly,and cues to weight shift fwd.      PT provided min verbal instruction to improve posture and proper use of LE, and improved posture and gait mechanics. Patient responded moderately to  instruction                    PT Education - 09/22/17 0954    Education provided  Yes    Education Details  HEP    Person(s) Educated  Patient    Methods  Explanation    Comprehension  Verbalized understanding       PT Short Term Goals - 08/05/17 1726      PT SHORT TERM GOAL #1   Title  Patient will be independent in home exercise program to improve strength/mobility for better functional independence with bed mobility and ADLs.    Baseline  No HEP    Time  4    Period  Weeks    Status  On-going    Target Date  08/24/17      PT SHORT TERM GOAL #2   Title  Patient will report a worst pain score of 3/10 VAS in the L hip to improve tolerance with ADLs and reduce symptoms with bed mobility activities.      Baseline  6/10 with medication; 07/06/17 4/10 right hip buttocks 4/10 buttocks pain 08/05/17    Time  4    Period  Weeks    Status  Partially Met    Target Date  08/24/17      PT SHORT TERM GOAL #3   Title  Patient will demonstrate sit to supine bed mobility, supine to sit bed mobility, and rolling L and R with minimal assistance for improved functional mobility and independence    Baseline  Patient requires mod-max A for bed mobility; independent with more time    Time  4    Period  Weeks    Status  Achieved        PT Long Term Goals - 09/07/17 1615      PT LONG TERM GOAL #1   Title  Patient (>79 years old) will improve 10-meter walk test to 0.6 meters/sec with least restrictive AD to demonstrate reduced fall risk  and capacity for limited community ambulation.    Baseline  0.21 meters/sec, 05/25/17 = .21 m/sec, . 40 m/sec 06/29/17; 07/06/17;  .40 m/sec  08/05/17 . 65 m/sec, 09/07/08 . 72 m/sec    Time  8    Period  Weeks    Status  Partially Met    Target Date  10/19/17      PT LONG TERM GOAL #2   Title  Patient will improve LEFS score to 40/80 to demonstrate improved functional mobility and independence with ADLs    Baseline  9/80; 05/25/17= 12/80, 20/80  06/29/17; 07/06/17:29/80 ; 08/05/17  34/80, 09/07/17, 42/80    Time  8    Period  Weeks    Status  On-going    Target Date  10/19/17      PT LONG TERM GOAL #3   Title  Patient will demonstrate ability to perform bed mobility independently for improved functional mobility and independence.    Baseline  Patient is independent with bed mobitiy     Time  8    Period  Weeks    Status  Achieved    Target Date  10/19/17      PT LONG TERM GOAL #4   Title  Patient will increase six minute walk test distance to >1000 for progression to community ambulator and improve gait ability    Baseline  225 ft; 07/06/17:  08/05/17 280 ft , 09/07/17 325 ft    Time  8    Period  Weeks    Status  On-going    Target Date  10/19/17            Plan - 09/22/17 0956    Clinical Impression Statement  Patient instructed in strengthening exercises with VC assist. Patient continues to have deficits in strength of 3+/5 B hips, decreased flexibility of ankle DF LLE, gait with rollator but is now able to ambulate into the building and down to therapy gym, dynamic standing balance deficits fair + dynamic standing balance. . Patient is improving with functional tasks and is walking longer distances, and is making functional improvements with transfers and mobility. , Patient will benefit from continued skilled PT to improve dynamic standing balance, strength, gait, decrease pain, and reach functional goals    Rehab Potential  Fair    Clinical Impairments Affecting Rehab Potential  - comorbidities, age, severity / + family support, acute injury    PT Frequency  2x / week    PT Duration  8 weeks    PT Treatment/Interventions  Aquatic Therapy;ADLs/Self Care Home Management;Cryotherapy;Electrical Stimulation;Moist Heat;Ultrasound;DME Instruction;Gait training;Stair training;Functional mobility training;Therapeutic activities;Therapeutic exercise;Balance training;Neuromuscular re-education;Patient/family education;Manual  techniques;Compression bandaging;Scar mobilization;Passive range of motion;Dry needling;Energy conservation;Taping    PT Next Visit Plan  LE strengthening    Consulted and Agree with Plan of Care  Patient       Patient will benefit from skilled therapeutic intervention in order to improve the following deficits and impairments:  Abnormal gait, Decreased activity tolerance, Decreased balance, Decreased endurance, Decreased coordination, Decreased mobility, Decreased range of motion, Decreased safety awareness, Difficulty walking, Decreased strength, Hypomobility, Impaired perceived functional ability, Impaired UE functional use, Obesity, Impaired flexibility, Pain  Visit Diagnosis: Difficulty in walking, not elsewhere classified  Muscle weakness (generalized)  Pain in left hip     Problem List Patient Active Problem List   Diagnosis Date Noted  . Adrenal mass ([place]) 08/02/2017  . Chronic atrial fibrillation ([place]) 08/02/2017  . Eczema 06/27/2017  . Diabetic ulcer of  left foot ([place]) 06/24/2017  . Mass of both adrenal glands ([place]) 05/16/2017  . Fatty liver 05/13/2017  . Strain of right hip 04/30/2017  . Ischial pain, right 04/30/2017  . Pain of left hip joint 04/22/2017  . Abnormality of gait and mobility 04/22/2017  . Muscle strain of left hip 04/09/2017  . Fall with injury 02/02/2017  . Anemia 11/18/2016  . Asthma 11/18/2016  . Carpal tunnel syndrome 11/18/2016  . Pain in joint involving ankle and foot 11/18/2016  . Obstructive sleep apnea of adult 11/18/2016  . Spinal stenosis of thoracic region 11/18/2016  . Abscess of tendon of left foot 09/21/2016  . Cubital tunnel syndrome on right 08/07/2016  . Chronic pain syndrome 02/25/2016  . Long term current use of opiate analgesic 02/25/2016  . Chronic right shoulder pain 02/25/2016  . Status post reverse total shoulder replacement 10/08/2015  . 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 Morbid obesity  with BMI of 40.0-44.9, adult ([place]) 09/23/2015  . Chronic prescription benzodiazepine use 09/23/2015  . Osteoarthritis, multiple sites 09/23/2015  . Other shoulder lesions (Right) 08/28/2015  . Cervical radiculitis 08/06/2015  . Opiate use (15 MME/Day) 01/16/2015  . Impingement syndrome of shoulder region 01/07/2015  . Mixed incontinence 11/26/2014  . Atrophic vaginitis 11/26/2014  . Anxiety and depression 09/21/2014  . Bladder cystocele 09/21/2014  . Gastro-esophageal reflux disease without esophagitis 09/21/2014  . DM (diabetes mellitus) type II, controlled, with peripheral vascular disorder ([place]) 08/31/2014  . Diabetic peripheral neuropathy associated with type 2 diabetes mellitus ([place]) 08/31/2014  . Asthma, moderate persistent, well-controlled 08/31/2014  . Hypothyroidism, adult 08/31/2014  . Peripheral vascular disease due to secondary diabetes mellitus ([place]) 12/11/2013  . Apnea, sleep 10/19/2013  . Paroxysmal atrial fibrillation ([place]); CHA2DS2-Vasc Score 5. On Pradaxa 07/27/2013  . Essential hypertension 07/27/2013    Arelia Sneddon S,PT DPT 09/22/2017, 9:57 AM  [place] MAIN [facility] SERVICES [address] [place], [place], [address] Phone: [phone]   Fax:  [phone]  Name: [name] MRN: [id number] Date of Birth: Jun 30, 1950

## 2017-09-27 ENCOUNTER — Telehealth: Payer: Self-pay | Admitting: Urology

## 2017-09-27 ENCOUNTER — Other Ambulatory Visit: Payer: Self-pay | Admitting: Internal Medicine

## 2017-09-27 DIAGNOSIS — I482 Chronic atrial fibrillation, unspecified: Secondary | ICD-10-CM

## 2017-09-27 MED ORDER — METOPROLOL SUCCINATE ER 100 MG PO TB24: 100.0000 mg | ORAL_TABLET | Freq: Every day | ORAL | 0 refills | Status: DC

## 2017-09-27 NOTE — Telephone Encounter (Signed)
Patient is calling and asking for a script for Myrbetriq 50mg . She said that the samples we gave her are helping.  Thanks, Sharyn Lull

## 2017-09-28 ENCOUNTER — Ambulatory Visit: Payer: PPO | Admitting: Physical Therapy

## 2017-09-28 MED ORDER — MIRABEGRON ER 50 MG PO TB24: 50.0000 mg | ORAL_TABLET | Freq: Every day | ORAL | 6 refills | Status: DC

## 2017-09-28 NOTE — Telephone Encounter (Signed)
Myrbetriq has been sent to pharmacy

## 2017-09-30 ENCOUNTER — Ambulatory Visit: Payer: PPO | Admitting: Physical Therapy

## 2017-10-04 ENCOUNTER — Ambulatory Visit (INDEPENDENT_AMBULATORY_CARE_PROVIDER_SITE_OTHER): Payer: PPO | Admitting: Internal Medicine

## 2017-10-04 ENCOUNTER — Encounter: Payer: Self-pay | Admitting: Internal Medicine

## 2017-10-04 VITALS — BP 144/74 | HR 60 | Temp 97.6°F | Resp 16 | Ht 65.0 in | Wt 261.0 lb

## 2017-10-04 DIAGNOSIS — J4540 Moderate persistent asthma, uncomplicated: Secondary | ICD-10-CM

## 2017-10-04 DIAGNOSIS — S80869A Insect bite (nonvenomous), unspecified lower leg, initial encounter: Secondary | ICD-10-CM | POA: Diagnosis not present

## 2017-10-04 DIAGNOSIS — T148XXA Other injury of unspecified body region, initial encounter: Secondary | ICD-10-CM | POA: Diagnosis not present

## 2017-10-04 DIAGNOSIS — I10 Essential (primary) hypertension: Secondary | ICD-10-CM

## 2017-10-04 DIAGNOSIS — L97422 Non-pressure chronic ulcer of left heel and midfoot with fat layer exposed: Secondary | ICD-10-CM | POA: Diagnosis not present

## 2017-10-04 DIAGNOSIS — N3946 Mixed incontinence: Secondary | ICD-10-CM | POA: Diagnosis not present

## 2017-10-04 DIAGNOSIS — E1142 Type 2 diabetes mellitus with diabetic polyneuropathy: Secondary | ICD-10-CM | POA: Diagnosis not present

## 2017-10-04 DIAGNOSIS — W57XXXA Bitten or stung by nonvenomous insect and other nonvenomous arthropods, initial encounter: Secondary | ICD-10-CM

## 2017-10-04 DIAGNOSIS — E11621 Type 2 diabetes mellitus with foot ulcer: Secondary | ICD-10-CM | POA: Diagnosis not present

## 2017-10-04 DIAGNOSIS — J4521 Mild intermittent asthma with (acute) exacerbation: Secondary | ICD-10-CM

## 2017-10-04 DIAGNOSIS — E1151 Type 2 diabetes mellitus with diabetic peripheral angiopathy without gangrene: Secondary | ICD-10-CM | POA: Diagnosis not present

## 2017-10-04 MED ORDER — MUPIROCIN 2 % EX OINT: 1.0000 "application " | TOPICAL_OINTMENT | Freq: Two times a day (BID) | CUTANEOUS | 0 refills | Status: DC

## 2017-10-04 MED ORDER — HYDROCHLOROTHIAZIDE 12.5 MG PO TABS: 12.5000 mg | ORAL_TABLET | Freq: Every day | ORAL | 1 refills | Status: DC

## 2017-10-04 MED ORDER — METHYLPREDNISOLONE ACETATE 40 MG/ML IJ SUSP
40.0000 mg | Freq: Once | INTRAMUSCULAR | Status: AC
  Administered 2017-10-04: 40 mg via INTRAMUSCULAR

## 2017-10-04 MED ORDER — FLUCONAZOLE 150 MG PO TABS: 150.0000 mg | ORAL_TABLET | Freq: Once | ORAL | 0 refills | Status: AC

## 2017-10-04 MED ORDER — BENZONATATE 200 MG PO CAPS: 200.0000 mg | ORAL_CAPSULE | Freq: Three times a day (TID) | ORAL | 0 refills | Status: DC | PRN

## 2017-10-04 MED ORDER — DOXYCYCLINE HYCLATE 100 MG PO TABS: 100.0000 mg | ORAL_TABLET | Freq: Two times a day (BID) | ORAL | 0 refills | Status: DC

## 2017-10-04 NOTE — Patient Instructions (Addendum)
F/u in 6 weeks  Try Mucinex DM green label with Ladona Ridgel  We gave you Depomedrol steroid shot 40 mg today should last for 4 days  Start HCTZ 12.5 mg am for blood pressure  Try Bactroban antibiotic to open skin wounds 2-3 x per day  Try Temovate or Triamcimolone to wounds on legs if they are itchy do not use to left heel or foot  Please use your inhalers for asthma Take care     Acute Bronchitis, Adult Acute bronchitis is when air tubes (bronchi) in the lungs suddenly get swollen. The condition can make it hard to breathe. It can also cause these symptoms:  A cough.  Coughing up clear, yellow, or green mucus.  Wheezing.  Chest congestion.  Shortness of breath.  A fever.  Body aches.  Chills.  A sore throat.  Follow these instructions at home: Medicines  Take over-the-counter and prescription medicines only as told by your doctor.  If you were prescribed an antibiotic medicine, take it as told by your doctor. Do not stop taking the antibiotic even if you start to feel better. General instructions  Rest.  Drink enough fluids to keep your pee (urine) clear or pale yellow.  Avoid smoking and secondhand smoke. If you smoke and you need help quitting, ask your doctor. Quitting will help your lungs heal faster.  Use an inhaler, cool mist vaporizer, or humidifier as told by your doctor.  Keep all follow-up visits as told by your doctor. This is important. How is this prevented? To lower your risk of getting this condition again:  Wash your hands often with soap and water. If you cannot use soap and water, use hand sanitizer.  Avoid contact with people who have cold symptoms.  Try not to touch your hands to your mouth, nose, or eyes.  Make sure to get the flu shot every year.  Contact a doctor if:  Your symptoms do not get better in 2 weeks. Get help right away if:  You cough up blood.  You have chest pain.  You have very bad shortness of  breath.  You become dehydrated.  You faint (pass out) or keep feeling like you are going to pass out.  You keep throwing up (vomiting).  You have a very bad headache.  Your fever or chills gets worse. This information is not intended to replace advice given to you by your health care provider. Make sure you discuss any questions you have with your health care provider. Document Released: 08/26/2007 Document Revised: 10/16/2015 Document Reviewed: 08/28/2015 Elsevier Interactive Patient Education  2018 [place], Adult Although you may not be able to control the fact that you have asthma, you can take actions to prevent episodes of asthma (asthma attacks). These actions include:  Creating a written plan for managing and treating your asthma attacks (asthma action plan).  Monitoring your asthma.  Avoiding things that can irritate your airways or make your asthma symptoms worse (asthma triggers).  Taking your medicines as directed.  Acting quickly if you have signs or symptoms of an asthma attack.  What are some ways to prevent an asthma attack? Create a plan Work with your health care provider to create an asthma action plan. This plan should include:  A list of your asthma triggers and how to avoid them.  A list of symptoms that you experience during an asthma attack.  Information about when to take medicine and how much medicine to  take.  Information to help you understand your peak flow measurements.  Contact information for your health care providers.  Daily actions that you can take to control asthma.  Monitor your asthma  To monitor your asthma:  Use your peak flow meter every morning and every evening for 2-3 weeks. Record the results in a journal. A drop in your peak flow numbers on one or more days may mean that you are starting to have an asthma attack, even if you are not having symptoms.  When you have asthma symptoms, write  them down in a journal.  Avoid asthma triggers  Work with your health care provider to find out what your asthma triggers are. This can be done by:  Being tested for allergies.  Keeping a journal that notes when asthma attacks occur and what may have contributed to them.  Asking your health care provider whether other medical conditions make your asthma worse.  Common asthma triggers include:  Dust.  Smoke. This includes campfire smoke and secondhand smoke from tobacco products.  Pet dander.  Trees, grasses or pollens.  Very cold, dry, or humid air.  Mold.  Foods that contain high amounts of sulfites.  Strong smells.  Engine exhaust and air pollution.  Aerosol sprays and fumes from household cleaners.  Household pests and their droppings, including dust mites and cockroaches.  Certain medicines, including NSAIDs.  Once you have determined your asthma triggers, take steps to avoid them. Depending on your triggers, you may be able to reduce the chance of an asthma attack by:  Keeping your home clean. Have someone dust and vacuum your home for you 1 or 2 times a week. If possible, have them use a high-efficiency particulate arrestance (HEPA) vacuum.  Washing your sheets weekly in hot water.  Using allergy-proof mattress covers and casings on your bed.  Keeping pets out of your home.  Taking care of mold and water problems in your home.  Avoiding areas where people smoke.  Avoiding using strong perfumes or odor sprays.  Avoid spending a lot of time outdoors when pollen counts are high and on very windy days.  Talking with your health care provider before stopping or starting any new medicines.  Medicines Take over-the-counter and prescription medicines only as told by your health care provider. Many asthma attacks can be prevented by carefully following your medicine schedule. Taking your medicines correctly is especially important when you cannot avoid certain  asthma triggers. Even if you are doing well, do not stop taking your medicine and do not take less medicine. Act quickly If an asthma attack happens, acting quickly can decrease how severe it is and how long it lasts. Take these actions:  Pay attention to your symptoms. If you are coughing, wheezing, or having difficulty breathing, do not wait to see if your symptoms go away on their own. Follow your asthma action plan.  If you have followed your asthma action plan and your symptoms are not improving, call your health care provider or seek immediate medical care at the nearest hospital.  It is important to write down how often you need to use your fast-acting rescue inhaler. You can track how often you use an inhaler in your journal. If you are using your rescue inhaler more often, it may mean that your asthma is not under control. Adjusting your asthma treatment plan may help you to prevent future asthma attacks and help you to gain better control of your condition. How can I  prevent an asthma attack when I exercise?  Exercise is a common asthma trigger. To prevent asthma attacks during exercise:  Follow advice from your health care provider about whether you should use your fast-acting inhaler before exercising. Many people with asthma experience exercise-induced bronchoconstriction (EIB). This condition often worsens during vigorous exercise in cold, humid, or dry environments. Usually, people with EIB can stay very active by using a fast-acting inhaler before exercising.  Avoid exercising outdoors in very cold or humid weather.  Avoid exercising outdoors when pollen counts are high.  Warm up and cool down when exercising.  Stop exercising right away if asthma symptoms start.  Consider taking part in exercises that are less likely to cause asthma symptoms such as:  Indoor swimming.  Biking.  Walking.  Hiking.  Playing football.  This information is not intended to replace advice  given to you by your health care provider. Make sure you discuss any questions you have with your health care provider. Document Released: 02/25/2009 Document Revised: 11/08/2015 Document Reviewed: 08/24/2015 Elsevier Interactive Patient Education  2018 [place], Adult Taking care of your wound properly can help to prevent pain and infection. It can also help your wound to heal more quickly. How is this treated? Wound care  Follow instructions from your health care provider about how to take care of your wound. Make sure you: ? Wash your hands with soap and water before you change the bandage (dressing). If soap and water are not available, use hand sanitizer. ? Change your dressing as told by your health care provider. ? Leave stitches (sutures), skin glue, or adhesive strips in place. These skin closures may need to stay in place for 2 weeks or longer. If adhesive strip edges start to loosen and curl up, you may trim the loose edges. Do not remove adhesive strips completely unless your health care provider tells you to do that.  Check your wound area every day for signs of infection. Check for: ? More redness, swelling, or pain. ? More fluid or blood. ? Warmth. ? Pus or a bad smell.  Ask your health care provider if you should clean the wound with mild soap and water. Doing this may include: ? Using a clean towel to pat the wound dry after cleaning it. Do not rub or scrub the wound. ? Applying a cream or ointment. Do this only as told by your health care provider. ? Covering the incision with a clean dressing.  Ask your health care provider when you can leave the wound uncovered. Medicines   If you were prescribed an antibiotic medicine, cream, or ointment, take or use the antibiotic as told by your health care provider. Do not stop taking or using the antibiotic even if your condition improves.  Take over-the-counter and prescription medicines only as told by  your health care provider. If you were prescribed pain medicine, take it at least 30 minutes before doing any wound care or as told by your health care provider. General instructions  Return to your normal activities as told by your health care provider. Ask your health care provider what activities are safe.  Do not scratch or pick at the wound.  Keep all follow-up visits as told by your health care provider. This is important.  Eat a diet that includes protein, vitamin A, vitamin C, and other nutrient-rich foods. These help the wound heal: ? Protein-rich foods include meat, dairy, beans, nuts, and other sources. ?  Vitamin A-rich foods include carrots and dark green, leafy vegetables. ? Vitamin C-rich foods include citrus, tomatoes, and other fruits and vegetables. ? Nutrient-rich foods have protein, carbohydrates, fat, vitamins, or minerals. Eat a variety of healthy foods including vegetables, fruits, and whole grains. Contact a health care provider if:  You received a tetanus shot and you have swelling, severe pain, redness, or bleeding at the injection site.  Your pain is not controlled with medicine.  You have more redness, swelling, or pain around the wound.  You have more fluid or blood coming from the wound.  Your wound feels warm to the touch.  You have pus or a bad smell coming from the wound.  You have a fever or chills.  You are nauseous or you vomit.  You are dizzy. Get help right away if:  You have a red streak going away from your wound.  The edges of the wound open up and separate.  Your wound is bleeding and the bleeding does not stop with gentle pressure.  You have a rash.  You faint.  You have trouble breathing. This information is not intended to replace advice given to you by your health care provider. Make sure you discuss any questions you have with your health care provider. Document Released: 12/17/2007 Document Revised: 11/06/2015 Document  Reviewed: 09/24/2015 Elsevier Interactive Patient Education  2017 [place].  Hypertension Hypertension is another name for high blood pressure. High blood pressure forces your heart to work harder to pump blood. This can cause problems over time. There are two numbers in a blood pressure reading. There is a top number (systolic) over a bottom number (diastolic). It is best to have a blood pressure below 120/80. Healthy choices can help lower your blood pressure. You may need medicine to help lower your blood pressure if:  Your blood pressure cannot be lowered with healthy choices.  Your blood pressure is higher than 130/80.  Follow these instructions at home: Eating and drinking  If directed, follow the DASH eating plan. This diet includes: ? Filling half of your plate at each meal with fruits and vegetables. ? Filling one quarter of your plate at each meal with whole grains. Whole grains include whole wheat pasta, brown rice, and whole grain bread. ? Eating or drinking low-fat dairy products, such as skim milk or low-fat yogurt. ? Filling one quarter of your plate at each meal with low-fat (lean) proteins. Low-fat proteins include fish, skinless chicken, eggs, beans, and tofu. ? Avoiding fatty meat, cured and processed meat, or chicken with skin. ? Avoiding premade or processed food.  Eat less than 1,500 mg of salt (sodium) a day.  Limit alcohol use to no more than 1 drink a day for nonpregnant women and 2 drinks a day for men. One drink equals 12 oz of beer, 5 oz of wine, or 1 oz of hard liquor. Lifestyle  Work with your doctor to stay at a healthy weight or to lose weight. Ask your doctor what the best weight is for you.  Get at least 30 minutes of exercise that causes your heart to beat faster (aerobic exercise) most days of the week. This may include walking, swimming, or biking.  Get at least 30 minutes of exercise that strengthens your muscles (resistance exercise) at  least 3 days a week. This may include lifting weights or pilates.  Do not use any products that contain nicotine or tobacco. This includes cigarettes and e-cigarettes. If you need help quitting,  ask your doctor.  Check your blood pressure at home as told by your doctor.  Keep all follow-up visits as told by your doctor. This is important. Medicines  Take over-the-counter and prescription medicines only as told by your doctor. Follow directions carefully.  Do not skip doses of blood pressure medicine. The medicine does not work as well if you skip doses. Skipping doses also puts you at risk for problems.  Ask your doctor about side effects or reactions to medicines that you should watch for. Contact a doctor if:  You think you are having a reaction to the medicine you are taking.  You have headaches that keep coming back (recurring).  You feel dizzy.  You have swelling in your ankles.  You have trouble with your vision. Get help right away if:  You get a very bad headache.  You start to feel confused.  You feel weak or numb.  You feel faint.  You get very bad pain in your: ? Chest. ? Belly (abdomen).  You throw up (vomit) more than once.  You have trouble breathing. Summary  Hypertension is another name for high blood pressure.  Making healthy choices can help lower blood pressure. If your blood pressure cannot be controlled with healthy choices, you may need to take medicine. This information is not intended to replace advice given to you by your health care provider. Make sure you discuss any questions you have with your health care provider. Document Released: 08/26/2007 Document Revised: 02/05/2016 Document Reviewed: 02/05/2016 Elsevier Interactive Patient Education  [employer].

## 2017-10-04 NOTE — Progress Notes (Signed)
Chief Complaint  Patient presents with  . Follow-up   F/u  1. HTN BP elevated on quinapril 40, norvasc 5, metoprolol 100 xl qd 144/74  2. Cough and congestion h/o bronchitis cough is productive and new since last visit She is having to use albuterol more has Advair  3. C/o bites to trunk and extremities itching has [facility] rec use that there  4. Chronic left heel/foot wound healed today but with chronic foot pain will disc with podiatry to see if rec MRI left foot Dr. [name]  5. Urinary incontinence saw OB/GYN/urology pessary will not fit too large and not sure if wants to do bladder sling  6. DM 2 A1C 6.4 05/10/17   Review of Systems  Constitutional: Negative for weight loss.  HENT: Negative for hearing loss.   Eyes: Negative for blurred vision.  Respiratory: Positive for cough and sputum production.   Cardiovascular: Negative for chest pain.  Gastrointestinal: Negative for abdominal pain.  Musculoskeletal: Positive for joint pain.  Skin: Negative for rash.       Bites to skin   Neurological: Negative for headaches.  Psychiatric/Behavioral: Negative for depression.   Past Medical History:  Diagnosis Date  . Abnormal antibody titer   . Anxiety and depression   . Arthritis   . Asthma   . Cystocele   . Depression   . Diabetes ([place])   . DVT (deep venous thrombosis) (HCC)    Righ calf  . Dysrhythmia   . Edema   . GERD (gastroesophageal reflux disease)   . Heart murmur    Echocardiogram June 2015: Mild MR (possible vegetation seen on TTE, not seen on TEE), normal LV size with moderate concentric LVH. Normal function EF 60-65%. Normal diastolic function. Mild LA dilation.  Marland Kitchen History of IBS   . History of methicillin resistant staphylococcus aureus (MRSA) 2008  . Hyperlipidemia   . Hypertension   . Hypothyroidism   . Insomnia   . Kidney stone    kidney stones with lithotripsy  . Neuropathy involving both lower extremities   . Obesity, Class II, BMI 35-39.9, with comorbidity     . Paroxysmal atrial fibrillation ([place]) 2007   In June 2015, Cardiac Event Monitor: Mostly SR/sinus arrhythmia with PVCs that are frequent. Short bursts of A. fib lasting several minutes;; CHA2DS2-VASc Score = 5 (age, Female, PVD, DM, HTN)  . Peripheral vascular disease ([place])   . Sleep apnea    use C-PAP  . Urinary incontinence   . Venous stasis dermatitis of both lower extremities    Past Surgical History:  Procedure Laterality Date  . ANKLE SURGERY Right   . APPLICATION OF WOUND VAC Left 06/27/2015   Procedure: APPLICATION OF WOUND VAC ( POSSIBLE ) ;  Surgeon: [name], MD;  Location: [facility] ORS;  Service: Vascular;  Laterality: Left;  . APPLICATION OF WOUND VAC Left 09/25/2016   Procedure: APPLICATION OF WOUND VAC;  Surgeon: [name], DPM;  Location: [facility];  Service: Podiatry;  Laterality: Left;  . arm surgery     right    . CHOLECYSTECTOMY    . COLONOSCOPY    . COLONOSCOPY WITH PROPOFOL N/A 01/11/2017   Procedure: COLONOSCOPY WITH PROPOFOL;  Surgeon: [name], MD;  Location: [facility] ENDOSCOPY;  Service: Endoscopy;  Laterality: N/A;  . COLONOSCOPY WITH PROPOFOL N/A 02/22/2017   Procedure: COLONOSCOPY WITH PROPOFOL;  Surgeon: [name], MD;  Location: [facility] ENDOSCOPY;  Service: Endoscopy;  Laterality: N/A;  . COLONOSCOPY WITH PROPOFOL  N/A 05/31/2017   Procedure: COLONOSCOPY WITH PROPOFOL;  Surgeon: [name], MD;  Location: [facility] ENDOSCOPY;  Service: Endoscopy;  Laterality: N/A;  . ESOPHAGOGASTRODUODENOSCOPY (EGD) WITH PROPOFOL N/A 01/11/2017   Procedure: ESOPHAGOGASTRODUODENOSCOPY (EGD) WITH PROPOFOL;  Surgeon: [name], MD;  Location: [facility] ENDOSCOPY;  Service: Endoscopy;  Laterality: N/A;  . HERNIA REPAIR     umbilical  . HIATAL HERNIA REPAIR    . I&D EXTREMITY Left 06/27/2015   Procedure: IRRIGATION AND DEBRIDEMENT EXTREMITY            ( CALF HEMATOMA ) POSSIBLE WOUND VAC;  Surgeon: [name], MD;  Location: [facility] ORS;  Service: Vascular;   Laterality: Left;  . INCISION AND DRAINAGE OF WOUND Left 09/25/2016   Procedure: IRRIGATION AND DEBRIDEMENT - PARTIAL RESECTION OF ACHILLES TENDON WITH WOUND VAC APPLICATION;  Surgeon: [name], DPM;  Location: [facility] ORS;  Service: Podiatry;  Laterality: Left;  . IRRIGATION AND DEBRIDEMENT ABSCESS Left 09/07/2016   Procedure: IRRIGATION AND DEBRIDEMENT ABSCESS LEFT HEEL;  Surgeon: [name], DPM;  Location: [facility] ORS;  Service: Podiatry;  Laterality: Left;  . JOINT REPLACEMENT  2013   left knee replacement  . KIDNEY SURGERY Right    kidney stones  . LITHOTRIPSY    . NM [facility] HX)  February 2017   Likely breast attenuation. LOW RISK study. Normal EF 55-60%.  . OTHER SURGICAL HISTORY     08/2016 or 09/2016 surgery on achilles tenso h/o staph infection heal. Dr. [name]  . removal of left hematoma Left    leg  . REVERSE SHOULDER ARTHROPLASTY Right 10/08/2015   Procedure: REVERSE SHOULDER ARTHROPLASTY;  Surgeon: [name], MD;  Location: [facility] ORS;  Service: Orthopedics;  Laterality: Right;  . SLEEVE GASTROPLASTY    . TONSILLECTOMY    . TOTAL KNEE ARTHROPLASTY    . TOTAL SHOULDER REPLACEMENT Right   . TRANSESOPHAGEAL ECHOCARDIOGRAM  08/08/2013   Mild LVH, EF 60-65%. Moderate LA dilation and mild RA dilation. Mild MR with no evidence of stenosis and no evidence of endocarditis. A false chordae is noted.  . TRANSTHORACIC ECHOCARDIOGRAM  08/03/2013   Mild-Moderate concentric LVH, EF 60-65%. Normal diastolic function. Mild LA dilation. Mild MR with possible vegetation  - not confirmed on TEE   . ULNAR NERVE TRANSPOSITION Right 08/06/2016   Procedure: ULNAR NERVE DECOMPRESSION/TRANSPOSITION;  Surgeon: [name], MD;  Location: [facility] ORS;  Service: Orthopedics;  Laterality: Right;  . UPPER GI ENDOSCOPY    . VAGINAL HYSTERECTOMY     Family History  Problem Relation Age of Onset  . Skin cancer Father   . Diabetes Father   . Hypertension Father   . Peripheral vascular  disease Father   . Cancer Father   . Cerebral aneurysm Father   . Alcohol abuse Father   . Varicose Veins Mother   . Kidney disease Mother   . Arthritis Mother   . Mental illness Sister   . Cancer Maternal Aunt        breast  . Breast cancer Maternal Aunt   . Arthritis Maternal Grandmother   . Hypertension Maternal Grandmother   . Diabetes Maternal Grandmother   . Arthritis Maternal Grandfather   . Heart disease Maternal Grandfather   . Hypertension Maternal Grandfather   . Arthritis Paternal Grandmother   . Hypertension Paternal Grandmother   . Diabetes Paternal Grandmother   . Arthritis Paternal Grandfather   . Hypertension Paternal Grandfather   . Bladder Cancer Neg  Hx   . Kidney cancer Neg Hx    Social History   Socioeconomic History  . Marital status: Married    Spouse name: Not on file  . Number of children: Not on file  . Years of education: Not on file  . Highest education level: Not on file  Occupational History  . Not on file  Social Needs  . Financial resource strain: Not on file  . Food insecurity:    Worry: Not on file    Inability: Not on file  . Transportation needs:    Medical: Not on file    Non-medical: Not on file  Tobacco Use  . Smoking status: Never Smoker  . Smokeless tobacco: Never Used  Substance and Sexual Activity  . Alcohol use: No    Alcohol/week: 0.0 oz  . Drug use: No  . Sexual activity: Yes  Lifestyle  . Physical activity:    Days per week: Not on file    Minutes per session: Not on file  . Stress: Not on file  Relationships  . Social connections:    Talks on phone: Not on file    Gets together: Not on file    Attends religious service: Not on file    Active member of club or organization: Not on file    Attends meetings of clubs or organizations: Not on file    Relationship status: Not on file  . Intimate partner violence:    Fear of current or ex partner: Not on file    Emotionally abused: Not on file    Physically  abused: Not on file    Forced sexual activity: Not on file  Other Topics Concern  . Not on file  Social History Narrative   She is currently married -- for 28 years. Does not work. Does not smoke or take alcohol. She never smoked. She exercises at least 3 days a week since before her gastric surgery.   Marital status reviewed in history of present illness.   She has children and grandchildren    No outpatient medications have been marked as taking for the 10/04/17 encounter (Office Visit) with [name], [name], MD.   Allergies  Allergen Reactions  . Penicillins Hives, Shortness Of Breath and Swelling    Facial swelling Has patient had a PCN reaction causing immediate rash, facial/tongue/throat swelling, SOB or lightheadedness with hypotension: Yes Has patient had a PCN reaction causing severe rash involving mucus membranes or skin necrosis: Yes Has patient had a PCN reaction that required hospitalization No Has patient had a PCN reaction occurring within the last 10 years: No If all of the above answers are "NO", then may proceed with Cephalosporin use.   . Aspirin Hives  . Morphine And Related Other (See Comments)    Patient becomes very confused  . Terramycin [Oxytetracycline] Hives   Recent Results (from the past 2160 hour(s))  Urinalysis, Routine w reflex microscopic     Status: Abnormal   Collection Time: 08/02/17  2:27 PM  Result Value Ref Range   Color, Urine YELLOW Yellow;Lt. Yellow   APPearance Cloudy (A) Clear   Specific Gravity, Urine >=1.030 (A) 1.000 - 1.030   pH 5.5 5.0 - 8.0   Total Protein, Urine TRACE (A) Negative   Urine Glucose NEGATIVE Negative   Ketones, ur TRACE (A) Negative   Bilirubin Urine NEGATIVE Negative   Hgb urine dipstick MODERATE (A) Negative   Urobilinogen, UA 0.2 0.0 - 1.0   Leukocytes, UA  LARGE (A) Negative   Nitrite NEGATIVE Negative   WBC, UA TNTC(>50/hpf) (A) 0-2/hpf   RBC / HPF 11-20/hpf (A) 0-2/hpf   Squamous Epithelial / LPF  Rare(0-4/hpf) Rare(0-4/hpf)   Bacteria, UA Many(>50/hpf) (A) None  Urine Culture     Status: None   Collection Time: 08/02/17  2:27 PM  Result Value Ref Range   MICRO NUMBER: [id number]    SPECIMEN QUALITY: ADEQUATE    Sample Source URINE    STATUS: FINAL    ISOLATE 1:      Three or more organisms present, each greater than 10,000 cu/mL. May represent normal flora contamination from external genitalia. No further testing is required.  ToxASSURE Select 13 (MW), Urine     Status: None   Collection Time: 08/24/17  2:20 PM  Result Value Ref Range   Summary FINAL     Comment: ==================================================================== TOXASSURE SELECT 13 (MW) ==================================================================== Test                             Result       Flag       Units Drug Present and Declared for Prescription Verification   Alprazolam                     71           EXPECTED   ng/mg creat   Alpha-hydroxyalprazolam        114          EXPECTED   ng/mg creat    Source of alprazolam is a scheduled prescription medication.    Alpha-hydroxyalprazolam is an expected metabolite of alprazolam.   Hydrocodone                    119          EXPECTED   ng/mg creat   Norhydrocodone                 175          EXPECTED   ng/mg creat    Sources of hydrocodone include scheduled prescription    medications. Norhydrocodone is an expected metabolite of    hydrocodone. ==================================================================== Test                      Result    Flag   Units      Ref Range   Creatinine              63                mg/dL      >=20 ==================================================================== Declared Medications:  The flagging and interpretation on this report are based on the  following declared medications.  Unexpected results may arise from  inaccuracies in the declared medications.  **Note: The testing scope of this panel includes  these medications:  Alprazolam  Hydrocodone (Hydrocodone-Acetaminophen)  **Note: The testing scope of this panel does not include following  reported medications:  Acetaminophen (Hydrocodone-Acetaminophen)  Albuterol  Amlodipine  Bupropion  Clobetasol  Cyanocobalamin  Dabigatran (Pradaxa)  Dexlansoprazole  Duloxetine  Estradiol  Ferrous Gluconate  Fluconazole  Fluticasone (Advair)  Gabapentin  Iron (Ferrous Sulfate)  Levocetirizine  Levofloxacin  Levothyroxine  Metformin  Metoprolol  Mirabegron  Montelukast  Multivitamin  Ondansetron  Oxybutynin  Potassium  Quinapril  Salmeterol (Advair)  Topical  Triamci nolone ==================================================================== For clinical consultation, please call [phone]. ====================================================================   Bladder Scan (Post Void Residual) in office     Status: None   Collection Time: 09/02/17  3:50 PM  Result Value Ref Range   Scan Result 0   POCT urinalysis dipstick     Status: Abnormal   Collection Time: 09/15/17  1:19 PM  Result Value Ref Range   Color, UA yellow    Clarity, UA clear    Glucose, UA Negative Negative   Bilirubin (Urine) 1+    Ketones, UA neg    Spec Grav, UA >=1.030 (A) 1.010 - 1.025   Blood, UA large    pH, UA 6.0 5.0 - 8.0   Protein, UA Positive (A) Negative   Urobilinogen, UA 0.2 0.2 or 1.0 E.U./dL   Nitrite, UA neg    Leukocytes, UA Moderate (2+) (A) Negative   Appearance yellow    Odor neg   Urine Culture     Status: Abnormal   Collection Time: 09/15/17  2:11 PM  Result Value Ref Range   Urine Culture, Routine Final report (A)    Organism ID, Bacteria Escherichia coli (A)     Comment: Greater than 100,000 colony forming units per mL Susceptibility profile is consistent with a probable ESBL.    Antimicrobial Susceptibility Comment     Comment:       ** S = Susceptible; I = Intermediate; R = Resistant **                    P =  Positive; N = Negative             MICS are expressed in micrograms per mL    Antibiotic                 RSLT#1    RSLT#2    RSLT#3    RSLT#4 Amoxicillin/Clavulanic Acid    I Ampicillin                     R Cefazolin                      R Cefepime                       R Ceftriaxone                    R Cefuroxime                     R Ciprofloxacin                  R Ertapenem                      S Gentamicin                     S Imipenem                       S Levofloxacin                   R Meropenem                      S Nitrofurantoin                 S Piperacillin/Tazobactam        S Tetracycline  R Tobramycin                     S Trimethoprim/Sulfa             R    Objective  There is no height or weight on file to calculate BMI. Wt Readings from Last 3 Encounters:  09/15/17 264 lb 12.8 oz (120.1 kg)  09/02/17 264 lb 12.8 oz (120.1 kg)  08/24/17 254 lb (115.2 kg)   Temp Readings from Last 3 Encounters:  08/24/17 97.6 F (36.4 C)  08/02/17 97.8 F (36.6 C) (Oral)  06/24/17 98.2 F (36.8 C) (Oral)   BP Readings from Last 3 Encounters:  09/15/17 (!) 174/82  09/02/17 (!) 165/73  08/24/17 (!) 147/57   Pulse Readings from Last 3 Encounters:  09/15/17 67  09/02/17 65  08/24/17 (!) 55    Physical Exam  Constitutional: She is oriented to person, place, and time. She appears well-developed and well-nourished. She is cooperative.  HENT:  Head: Normocephalic and atraumatic.  Mouth/Throat: Oropharynx is clear and moist and mucous membranes are normal.  Eyes: Pupils are equal, round, and reactive to light. Conjunctivae are normal.  Cardiovascular: Normal rate, regular rhythm and normal heart sounds.  Pulmonary/Chest: Effort normal and breath sounds normal.  Neurological: She is alert and oriented to person, place, and time. Gait normal.  Skin: Skin is warm, dry and intact.     Psychiatric: She has a normal mood and affect. Her speech is  normal and behavior is normal. Judgment and thought content normal. Cognition and memory are normal.  Nursing note and vitals reviewed.   Assessment   1. HTN  2. Productive cough h/o asthma and bronchitis  3. Bites to trunk and extremities  4. Chronic left heel/foot pain  5. Urinary incontinence  6. DM 2 A1C 6.4  7. HM Plan   1. Cont meds add hctz 12.5  2. Depo 40 x 1, doxy with diflucan, tessalon perles  3. bactroban and tmc  4. Consider MRI left foot will disc with Dr. [name]  5. Failed pessary see hpi and ? If wants bladder sling  6. Cont meds  Ask about eye exam at f/u  F/u podiatry  7.  Had flu, pna 23, prevnar  Consider Tdap and shingrix in future Given Rx twinrixin pastto get at pharmacy h/o fatty liver  DEXA 10/17/14 normal  Ask about pap at f/uif ever had abnormal mammo neg 08/09/17 negative  Colonoscopy h/o polyps 02/22/17 prep not good needs to f/u and resch with Elberta Fortis appt 05/31/17 Had due in 5 years from 05/31/17 [facility]   Provider: Dr. Olivia Mackie [name]-Internal Medicine

## 2017-10-05 ENCOUNTER — Encounter: Payer: PPO | Admitting: Physical Therapy

## 2017-10-12 IMAGING — MR MR SHOULDER*R* W/O CM
5 series · 40 of 40 positions shown · non-contrast
Comparison: MRI dated 02/06/2015

CLINICAL DATA: Impingement syndrome of the right shoulder.
Decreased range of motion and arm weakness. The patient fell May 22, 2015.

EXAM:
MRI OF THE RIGHT SHOULDER WITHOUT CONTRAST
TECHNIQUE: Multiplanar, multisequence MR imaging of the shoulder was performed.
No intravenous contrast was administered.

[Series 3: T2 fat-sat · axial · 4.0mm · 0.55mm/px · z∈[-65,+40]mm · 10 of 25 slices shown (1 of 3)]
[im 1/25]
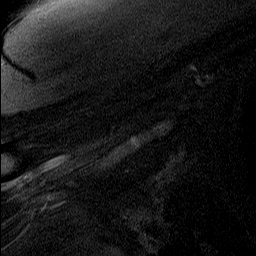
[im 3/25]
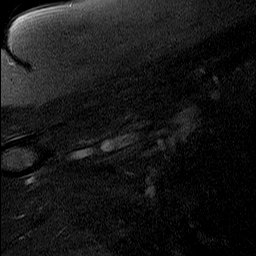
[im 6/25]
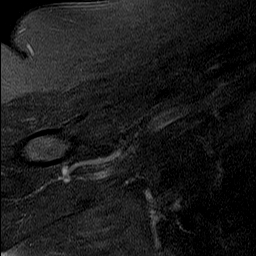
[im 9/25]
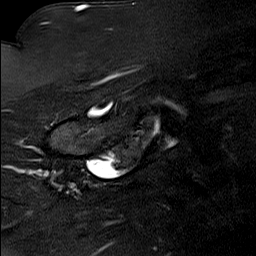
[im 11/25]
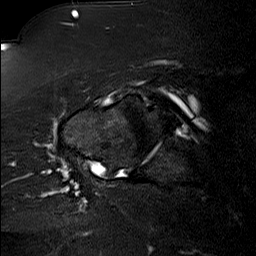
[im 14/25]
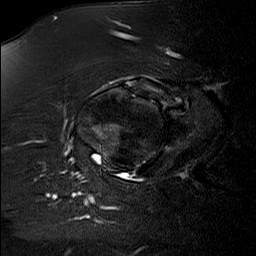
[im 17/25]
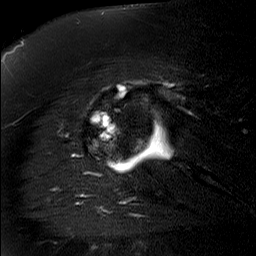
[im 19/25]
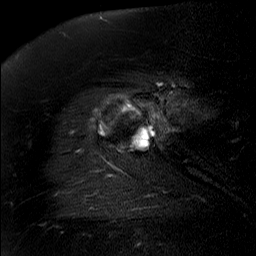
[im 22/25]
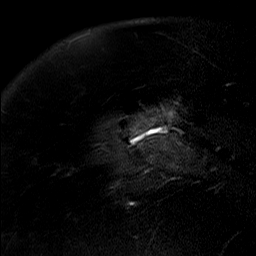
[im 25/25]
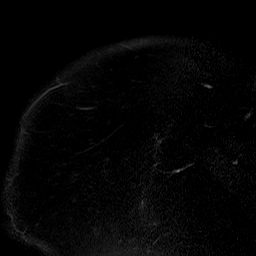

[Series 5: T2 fat-sat · coronal · 4.0mm · 0.62mm/px · 7 of 17 slices shown (2 of 3)]
[im 1/17]
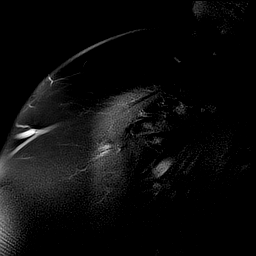
[im 3/17]
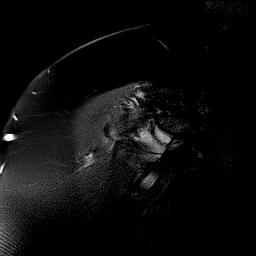
[im 6/17]
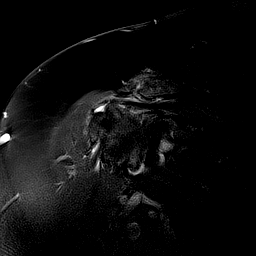
[im 9/17]
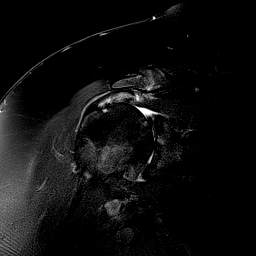
[im 11/17]
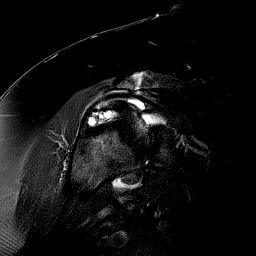
[im 14/17]
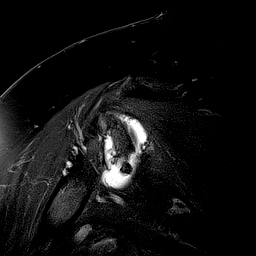
[im 17/17]
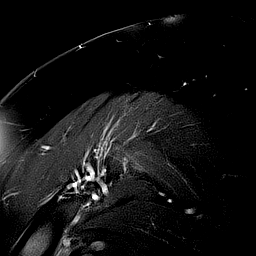

[Series 6: PD · coronal · 4.0mm · 0.62mm/px · 7 of 17 slices shown]
[im 1/17]
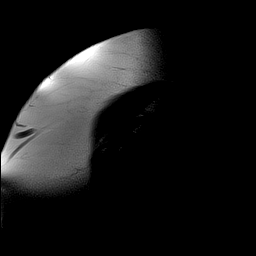
[im 3/17]
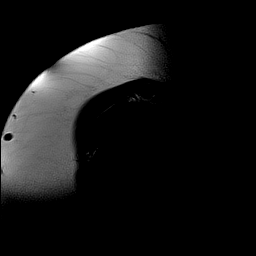
[im 6/17]
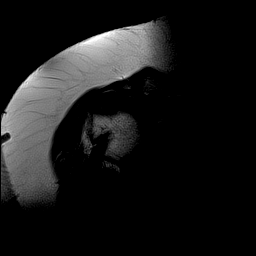
[im 9/17]
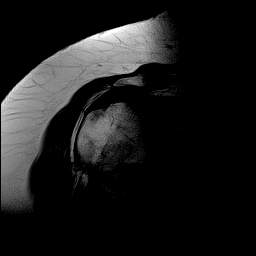
[im 11/17]
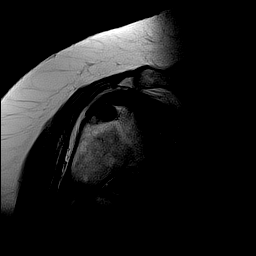
[im 14/17]
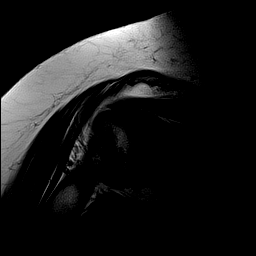
[im 17/17]
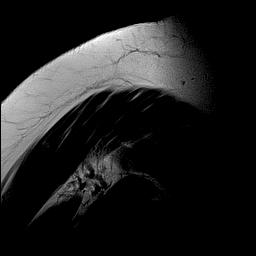

[Series 7: T1 · sagittal · 4.0mm · 0.55mm/px · 8 of 20 slices shown]
[im 1/20]
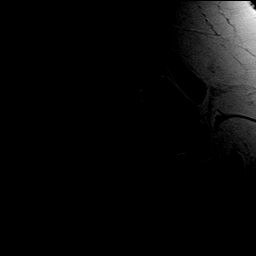
[im 3/20]
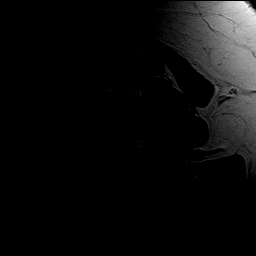
[im 6/20]
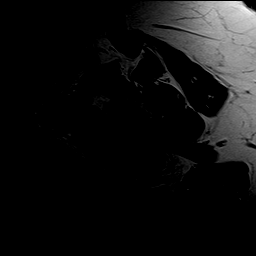
[im 9/20]
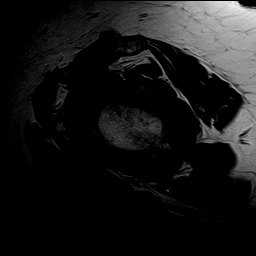
[im 11/20]
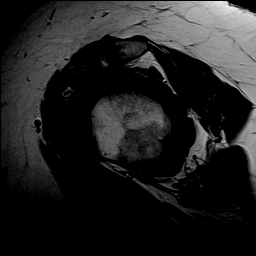
[im 14/20]
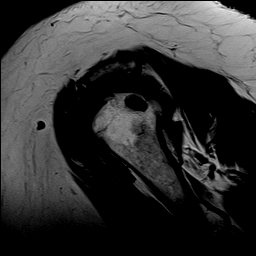
[im 17/20]
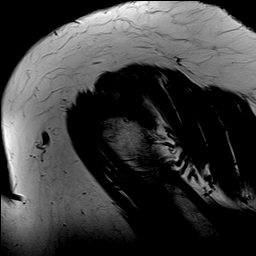
[im 20/20]
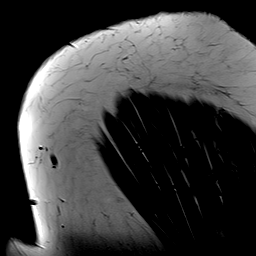

[Series 8: T2 fat-sat · sagittal · 4.0mm · 0.55mm/px · 8 of 20 slices shown (3 of 3)]
[im 1/20]
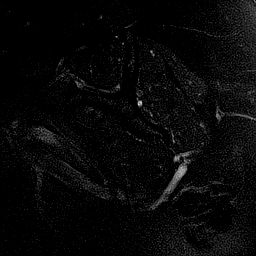
[im 3/20]
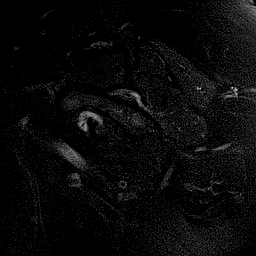
[im 6/20]
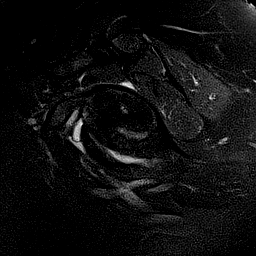
[im 9/20]
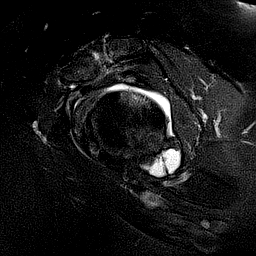
[im 11/20]
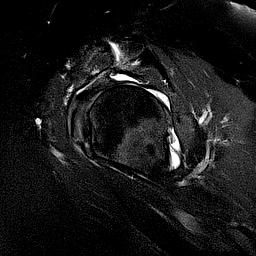
[im 14/20]
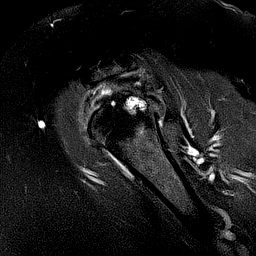
[im 17/20]
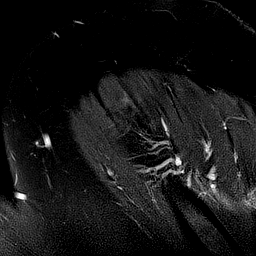
[im 20/20]
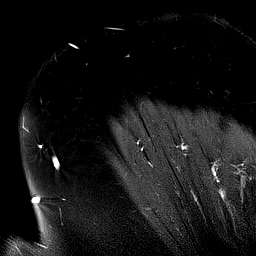

[40 of 40 positions shown; findings below may reference images not displayed]

FINDINGS: Rotator cuff: Chronic extensive severe rotator cuff
tendinopathy/tendinosis as previously demonstrated. Unchanged
extensive partial-thickness articular surface tear involving the
infraspinatus and supraspinous tendons. Intrasubstance ganglion
noted at the musculotendinous junction of the infraspinatus,
unchanged. The tear extends almost all the way through the anterior
aspect of the supraspinatus tendon. Subscapularis tendon is intact.

Muscles: Slight atrophy of the infraspinatus and supraspinous
muscles

Biceps long head: Moderate tendinopathy of the intra-articular
portion of the tendon. Partial tear of the tendon at the glenoid.

Acromioclavicular Joint: Os acromiale with secondary arthritic
changes. Slight degenerative changes at the AC joint.

Glenohumeral Joint: Severe osteoarthritis with full-thickness
cartilage loss and prominent marginal osteophytes on the humeral
head. Moderate joint effusion with debris in the joint.

Labrum: Diffusely severely degenerated with a tear of the labrum at
the biceps anchor as well as an irregular tear of the severely
degenerated posterior superior aspects of the labrum. Inferior
labrum cannot be defined.

Bones: Cystic degenerative changes in the greater tuberosity of the
proximal humerus.
IMPRESSION: No significant change in the appearance of the right shoulder since
the prior study.

1. Severe osteoarthritis of the glenohumeral joint.
2. Severe degeneration of the long head of the biceps tendon with a
partial tear of the biceps origin.
3. Extensive partial-thickness articular surface tears of the
infraspinatus and supraspinous tendons, unchanged.
4. Os acromiale which could predispose to impingement.

## 2017-10-13 ENCOUNTER — Other Ambulatory Visit: Payer: Self-pay | Admitting: Internal Medicine

## 2017-10-13 DIAGNOSIS — I10 Essential (primary) hypertension: Secondary | ICD-10-CM

## 2017-10-13 MED ORDER — QUINAPRIL HCL 40 MG PO TABS: 40.0000 mg | ORAL_TABLET | Freq: Every day | ORAL | 3 refills | Status: DC

## 2017-10-14 ENCOUNTER — Encounter: Payer: Self-pay | Admitting: Internal Medicine

## 2017-10-18 NOTE — Progress Notes (Signed)
Note sent to podiatry via electronically

## 2017-10-18 NOTE — Progress Notes (Signed)
tdap not in [facility] 

## 2017-10-20 ENCOUNTER — Other Ambulatory Visit: Payer: Self-pay | Admitting: Internal Medicine

## 2017-10-20 DIAGNOSIS — J4531 Mild persistent asthma with (acute) exacerbation: Secondary | ICD-10-CM

## 2017-10-20 DIAGNOSIS — J40 Bronchitis, not specified as acute or chronic: Secondary | ICD-10-CM

## 2017-10-20 MED ORDER — LEVOFLOXACIN 750 MG PO TABS: 750.0000 mg | ORAL_TABLET | Freq: Every day | ORAL | 0 refills | Status: DC

## 2017-10-25 DIAGNOSIS — I83029 Varicose veins of left lower extremity with ulcer of unspecified site: Secondary | ICD-10-CM | POA: Diagnosis not present

## 2017-10-25 DIAGNOSIS — L97521 Non-pressure chronic ulcer of other part of left foot limited to breakdown of skin: Secondary | ICD-10-CM | POA: Diagnosis not present

## 2017-10-25 DIAGNOSIS — E1143 Type 2 diabetes mellitus with diabetic autonomic (poly)neuropathy: Secondary | ICD-10-CM | POA: Diagnosis not present

## 2017-10-25 DIAGNOSIS — L97929 Non-pressure chronic ulcer of unspecified part of left lower leg with unspecified severity: Secondary | ICD-10-CM | POA: Diagnosis not present

## 2017-11-17 ENCOUNTER — Encounter: Payer: Self-pay | Admitting: Internal Medicine

## 2017-11-17 ENCOUNTER — Ambulatory Visit (INDEPENDENT_AMBULATORY_CARE_PROVIDER_SITE_OTHER): Payer: PPO | Admitting: Internal Medicine

## 2017-11-17 VITALS — BP 118/72 | HR 63 | Temp 98.0°F | Ht 65.0 in | Wt 258.0 lb

## 2017-11-17 DIAGNOSIS — E1142 Type 2 diabetes mellitus with diabetic polyneuropathy: Secondary | ICD-10-CM

## 2017-11-17 DIAGNOSIS — I10 Essential (primary) hypertension: Secondary | ICD-10-CM

## 2017-11-17 DIAGNOSIS — W19XXXA Unspecified fall, initial encounter: Secondary | ICD-10-CM

## 2017-11-17 DIAGNOSIS — B373 Candidiasis of vulva and vagina: Secondary | ICD-10-CM | POA: Diagnosis not present

## 2017-11-17 DIAGNOSIS — E119 Type 2 diabetes mellitus without complications: Secondary | ICD-10-CM | POA: Diagnosis not present

## 2017-11-17 DIAGNOSIS — K130 Diseases of lips: Secondary | ICD-10-CM

## 2017-11-17 DIAGNOSIS — B3731 Acute candidiasis of vulva and vagina: Secondary | ICD-10-CM

## 2017-11-17 LAB — POCT GLYCOSYLATED HEMOGLOBIN (HGB A1C): Hemoglobin A1C: 6.1 % — AB (ref 4.0–5.6)

## 2017-11-17 MED ORDER — NYSTATIN 100000 UNIT/GM EX CREA: 1.0000 "application " | TOPICAL_CREAM | Freq: Two times a day (BID) | CUTANEOUS | 11 refills | Status: DC

## 2017-11-17 MED ORDER — FLUCONAZOLE 150 MG PO TABS: 150.0000 mg | ORAL_TABLET | Freq: Once | ORAL | 2 refills | Status: AC

## 2017-11-17 NOTE — Progress Notes (Signed)
Chief Complaint  Patient presents with  . Follow-up   F/u  1. DM 2 last A1C 6.4 will rec eye MD appt with D.r B Nice  2. Left achilles tendons lesion not healing pending graft with Dr. [name]  3. Had fall x 3 weeks abrasion to left shin healing otherwise well  4. HTN controlled on meds with addition of hctz 12.5 mg qd  5. C/o yeast vaginitis and irritation around mouth thinks yeast wants Diflucan and topical yeast cream  Review of Systems  Constitutional: Negative for weight loss.  HENT: Negative for hearing loss.   Eyes: Negative for blurred vision.  Respiratory: Negative for shortness of breath.   Cardiovascular: Negative for chest pain.  Genitourinary:       +urinary incontinence   Musculoskeletal: Positive for falls.  Skin:       +abrasion  Neurological: Negative for headaches.  Psychiatric/Behavioral: Negative for depression.   Past Medical History:  Diagnosis Date  . Abnormal antibody titer   . Anxiety and depression   . Arthritis   . Asthma   . Cystocele   . Depression   . Diabetes ([place])   . DVT (deep venous thrombosis) (HCC)    Righ calf  . Dysrhythmia   . Edema   . GERD (gastroesophageal reflux disease)   . Heart murmur    Echocardiogram June 2015: Mild MR (possible vegetation seen on TTE, not seen on TEE), normal LV size with moderate concentric LVH. Normal function EF 60-65%. Normal diastolic function. Mild LA dilation.  Marland Kitchen History of IBS   . History of methicillin resistant staphylococcus aureus (MRSA) 2008  . Hyperlipidemia   . Hypertension   . Hypothyroidism   . Insomnia   . Kidney stone    kidney stones with lithotripsy  . Neuropathy involving both lower extremities   . Obesity, Class II, BMI 35-39.9, with comorbidity   . Paroxysmal atrial fibrillation ([place]) 2007   In June 2015, Cardiac Event Monitor: Mostly SR/sinus arrhythmia with PVCs that are frequent. Short bursts of A. fib lasting several minutes;; CHA2DS2-VASc Score = 5 (age, Female, PVD, DM,  HTN)  . Peripheral vascular disease ([place])   . Sleep apnea    use C-PAP  . Urinary incontinence   . Venous stasis dermatitis of both lower extremities    Past Surgical History:  Procedure Laterality Date  . ANKLE SURGERY Right   . APPLICATION OF WOUND VAC Left 06/27/2015   Procedure: APPLICATION OF WOUND VAC ( POSSIBLE ) ;  Surgeon: [name], MD;  Location: [facility] ORS;  Service: Vascular;  Laterality: Left;  . APPLICATION OF WOUND VAC Left 09/25/2016   Procedure: APPLICATION OF WOUND VAC;  Surgeon: [name], DPM;  Location: [facility];  Service: Podiatry;  Laterality: Left;  . arm surgery     right    . CHOLECYSTECTOMY    . COLONOSCOPY    . COLONOSCOPY WITH PROPOFOL N/A 01/11/2017   Procedure: COLONOSCOPY WITH PROPOFOL;  Surgeon: [name], MD;  Location: [facility] ENDOSCOPY;  Service: Endoscopy;  Laterality: N/A;  . COLONOSCOPY WITH PROPOFOL N/A 02/22/2017   Procedure: COLONOSCOPY WITH PROPOFOL;  Surgeon: [name], MD;  Location: [facility] ENDOSCOPY;  Service: Endoscopy;  Laterality: N/A;  . COLONOSCOPY WITH PROPOFOL N/A 05/31/2017   Procedure: COLONOSCOPY WITH PROPOFOL;  Surgeon: [name], MD;  Location: [facility] ENDOSCOPY;  Service: Endoscopy;  Laterality: N/A;  . ESOPHAGOGASTRODUODENOSCOPY (EGD) WITH PROPOFOL N/A 01/11/2017   Procedure: ESOPHAGOGASTRODUODENOSCOPY (EGD) WITH PROPOFOL;  Surgeon: [name], MD;  Location: [facility] ENDOSCOPY;  Service: Endoscopy;  Laterality: N/A;  . HERNIA REPAIR     umbilical  . HIATAL HERNIA REPAIR    . I&D EXTREMITY Left 06/27/2015   Procedure: IRRIGATION AND DEBRIDEMENT EXTREMITY            ( CALF HEMATOMA ) POSSIBLE WOUND VAC;  Surgeon: [name], MD;  Location: [facility] ORS;  Service: Vascular;  Laterality: Left;  . INCISION AND DRAINAGE OF WOUND Left 09/25/2016   Procedure: IRRIGATION AND DEBRIDEMENT - PARTIAL RESECTION OF ACHILLES TENDON WITH WOUND VAC APPLICATION;  Surgeon: [name], DPM;  Location: [facility] ORS;  Service:  Podiatry;  Laterality: Left;  . IRRIGATION AND DEBRIDEMENT ABSCESS Left 09/07/2016   Procedure: IRRIGATION AND DEBRIDEMENT ABSCESS LEFT HEEL;  Surgeon: [name], DPM;  Location: [facility] ORS;  Service: Podiatry;  Laterality: Left;  . JOINT REPLACEMENT  2013   left knee replacement  . KIDNEY SURGERY Right    kidney stones  . LITHOTRIPSY    . NM [facility] HX)  February 2017   Likely breast attenuation. LOW RISK study. Normal EF 55-60%.  . OTHER SURGICAL HISTORY     08/2016 or 09/2016 surgery on achilles tenso h/o staph infection heal. Dr. [name]  . removal of left hematoma Left    leg  . REVERSE SHOULDER ARTHROPLASTY Right 10/08/2015   Procedure: REVERSE SHOULDER ARTHROPLASTY;  Surgeon: [name], MD;  Location: [facility] ORS;  Service: Orthopedics;  Laterality: Right;  . SLEEVE GASTROPLASTY    . TONSILLECTOMY    . TOTAL KNEE ARTHROPLASTY    . TOTAL SHOULDER REPLACEMENT Right   . TRANSESOPHAGEAL ECHOCARDIOGRAM  08/08/2013   Mild LVH, EF 60-65%. Moderate LA dilation and mild RA dilation. Mild MR with no evidence of stenosis and no evidence of endocarditis. A false chordae is noted.  . TRANSTHORACIC ECHOCARDIOGRAM  08/03/2013   Mild-Moderate concentric LVH, EF 60-65%. Normal diastolic function. Mild LA dilation. Mild MR with possible vegetation  - not confirmed on TEE   . ULNAR NERVE TRANSPOSITION Right 08/06/2016   Procedure: ULNAR NERVE DECOMPRESSION/TRANSPOSITION;  Surgeon: [name], MD;  Location: [facility] ORS;  Service: Orthopedics;  Laterality: Right;  . UPPER GI ENDOSCOPY    . VAGINAL HYSTERECTOMY     Family History  Problem Relation Age of Onset  . Skin cancer Father   . Diabetes Father   . Hypertension Father   . Peripheral vascular disease Father   . Cancer Father   . Cerebral aneurysm Father   . Alcohol abuse Father   . Varicose Veins Mother   . Kidney disease Mother   . Arthritis Mother   . Mental illness Sister   . Cancer Maternal Aunt        breast  .  Breast cancer Maternal Aunt   . Arthritis Maternal Grandmother   . Hypertension Maternal Grandmother   . Diabetes Maternal Grandmother   . Arthritis Maternal Grandfather   . Heart disease Maternal Grandfather   . Hypertension Maternal Grandfather   . Arthritis Paternal Grandmother   . Hypertension Paternal Grandmother   . Diabetes Paternal Grandmother   . Arthritis Paternal Grandfather   . Hypertension Paternal Grandfather   . Bladder Cancer Neg Hx   . Kidney cancer Neg Hx    Social History   Socioeconomic History  . Marital status: Married    Spouse name: Not on file  . Number of children: Not on file  .  Years of education: Not on file  . Highest education level: Not on file  Occupational History  . Not on file  Social Needs  . Financial resource strain: Not on file  . Food insecurity:    Worry: Not on file    Inability: Not on file  . Transportation needs:    Medical: Not on file    Non-medical: Not on file  Tobacco Use  . Smoking status: Never Smoker  . Smokeless tobacco: Never Used  Substance and Sexual Activity  . Alcohol use: No    Alcohol/week: 0.0 standard drinks  . Drug use: No  . Sexual activity: Yes  Lifestyle  . Physical activity:    Days per week: Not on file    Minutes per session: Not on file  . Stress: Not on file  Relationships  . Social connections:    Talks on phone: Not on file    Gets together: Not on file    Attends religious service: Not on file    Active member of club or organization: Not on file    Attends meetings of clubs or organizations: Not on file    Relationship status: Not on file  . Intimate partner violence:    Fear of current or ex partner: Not on file    Emotionally abused: Not on file    Physically abused: Not on file    Forced sexual activity: Not on file  Other Topics Concern  . Not on file  Social History Narrative   She is currently married -- for 28 years. Does not work. Does not smoke or take alcohol. She never  smoked. She exercises at least 3 days a week since before her gastric surgery.   Marital status reviewed in history of present illness.   She has children and grandchildren    No outpatient medications have been marked as taking for the 11/17/17 encounter (Office Visit) with [name], [name], MD.   Allergies  Allergen Reactions  . Penicillins Hives, Shortness Of Breath and Swelling    Facial swelling Has patient had a PCN reaction causing immediate rash, facial/tongue/throat swelling, SOB or lightheadedness with hypotension: Yes Has patient had a PCN reaction causing severe rash involving mucus membranes or skin necrosis: Yes Has patient had a PCN reaction that required hospitalization No Has patient had a PCN reaction occurring within the last 10 years: No If all of the above answers are "NO", then may proceed with Cephalosporin use.   . Aspirin Hives  . Morphine And Related Other (See Comments)    Patient becomes very confused  . Terramycin [Oxytetracycline] Hives   Recent Results (from the past 2160 hour(s))  ToxASSURE Select 13 (MW), Urine     Status: None   Collection Time: 08/24/17  2:20 PM  Result Value Ref Range   Summary FINAL     Comment: ==================================================================== TOXASSURE SELECT 13 (MW) ==================================================================== Test                             Result       Flag       Units Drug Present and Declared for Prescription Verification   Alprazolam                     71           EXPECTED   ng/mg creat   Alpha-hydroxyalprazolam        114  EXPECTED   ng/mg creat    Source of alprazolam is a scheduled prescription medication.    Alpha-hydroxyalprazolam is an expected metabolite of alprazolam.   Hydrocodone                    119          EXPECTED   ng/mg creat   Norhydrocodone                 175          EXPECTED   ng/mg creat    Sources of hydrocodone include scheduled  prescription    medications. Norhydrocodone is an expected metabolite of    hydrocodone. ==================================================================== Test                      Result    Flag   Units      Ref Range   Creatinine              63                mg/dL      >=20 ==================================================================== Declared Medications:  The flagging and interpretation on this report are based on the  following declared medications.  Unexpected results may arise from  inaccuracies in the declared medications.  **Note: The testing scope of this panel includes these medications:  Alprazolam  Hydrocodone (Hydrocodone-Acetaminophen)  **Note: The testing scope of this panel does not include following  reported medications:  Acetaminophen (Hydrocodone-Acetaminophen)  Albuterol  Amlodipine  Bupropion  Clobetasol  Cyanocobalamin  Dabigatran (Pradaxa)  Dexlansoprazole  Duloxetine  Estradiol  Ferrous Gluconate  Fluconazole  Fluticasone (Advair)  Gabapentin  Iron (Ferrous Sulfate)  Levocetirizine  Levofloxacin  Levothyroxine  Metformin  Metoprolol  Mirabegron  Montelukast  Multivitamin  Ondansetron  Oxybutynin  Potassium  Quinapril  Salmeterol (Advair)  Topical  Triamci nolone ==================================================================== For clinical consultation, please call [phone]. ====================================================================   Bladder Scan (Post Void Residual) in office     Status: None   Collection Time: 09/02/17  3:50 PM  Result Value Ref Range   Scan Result 0   POCT urinalysis dipstick     Status: Abnormal   Collection Time: 09/15/17  1:19 PM  Result Value Ref Range   Color, UA yellow    Clarity, UA clear    Glucose, UA Negative Negative   Bilirubin (Urine) 1+    Ketones, UA neg    Spec Grav, UA >=1.030 (A) 1.010 - 1.025   Blood, UA large    pH, UA 6.0 5.0 - 8.0   Protein, UA  Positive (A) Negative   Urobilinogen, UA 0.2 0.2 or 1.0 E.U./dL   Nitrite, UA neg    Leukocytes, UA Moderate (2+) (A) Negative   Appearance yellow    Odor neg   Urine Culture     Status: Abnormal   Collection Time: 09/15/17  2:11 PM  Result Value Ref Range   Urine Culture, Routine Final report (A)    Organism ID, Bacteria Escherichia coli (A)     Comment: Greater than 100,000 colony forming units per mL Susceptibility profile is consistent with a probable ESBL.    Antimicrobial Susceptibility Comment     Comment:       ** S = Susceptible; I = Intermediate; R = Resistant **                    P = Positive; N =  Negative             MICS are expressed in micrograms per mL    Antibiotic                 RSLT#1    RSLT#2    RSLT#3    RSLT#4 Amoxicillin/Clavulanic Acid    I Ampicillin                     R Cefazolin                      R Cefepime                       R Ceftriaxone                    R Cefuroxime                     R Ciprofloxacin                  R Ertapenem                      S Gentamicin                     S Imipenem                       S Levofloxacin                   R Meropenem                      S Nitrofurantoin                 S Piperacillin/Tazobactam        S Tetracycline                   R Tobramycin                     S Trimethoprim/Sulfa             R    Objective  Body mass index is 42.93 kg/m. Wt Readings from Last 3 Encounters:  11/17/17 258 lb (117 kg)  10/04/17 261 lb (118.4 kg)  09/15/17 264 lb 12.8 oz (120.1 kg)   Temp Readings from Last 3 Encounters:  11/17/17 98 F (36.7 C) (Oral)  10/04/17 97.6 F (36.4 C) (Oral)  08/24/17 97.6 F (36.4 C)   BP Readings from Last 3 Encounters:  11/17/17 118/72  10/04/17 (!) 144/74  09/15/17 (!) 174/82   Pulse Readings from Last 3 Encounters:  11/17/17 63  10/04/17 60  09/15/17 67    Physical Exam  Constitutional: She is oriented to person, place, and time. Vital signs are  normal. She appears well-developed. She is cooperative.  HENT:  Head: Normocephalic and atraumatic.  Mouth/Throat: Oropharynx is clear and moist and mucous membranes are normal.  Eyes: Pupils are equal, round, and reactive to light. Conjunctivae are normal.  Cardiovascular: Normal rate, regular rhythm and normal heart sounds.  Pulmonary/Chest: Effort normal and breath sounds normal.  Neurological: She is alert and oriented to person, place, and time. Gait normal.  BL walks with rollator   Skin: Skin is warm and dry. Abrasion noted.     Psychiatric: She has a normal  mood and affect. Her behavior is normal. Judgment and thought content normal. Cognition and memory are normal.  Nursing note and vitals reviewed.   Assessment   1. DM 2 A1C was 6.4 04/2017, 6.1 today  2. HTN 3. Fall ,mechanical  4 yeast vaginitis and angular chelitis 5. HM Plan   1. Cont meds  poc A1C today  Labs at f/u  rec eye exam Dr. Gertie Baron pt to sch  2. Cont meds BP better  3. Monitor for now  Consider shower chair in future  4. Diflucan and nystatin  5.  Had flu, pna 23, prevnar  Consider Tdap and shingrix in future Given Rx twinrixin pastto get at pharmacy h/o fatty liver  DEXA 10/17/14 normal  Out of age window Ob/gyn  mammo neg 08/09/17 negative  Colonoscopy h/o polyps 02/22/17 prep not good needs to f/u and resch with Elberta Fortis appt 05/31/17 Had due in 5 years from 05/31/17 [facility]    Provider: Dr. Olivia Mackie [name]-Internal Medicine

## 2017-11-17 NOTE — Patient Instructions (Addendum)
F/u in 4 months next visit am appt fasting for 12 hours only water and medications for labs Make appt with B Nice (eye doctor)    Vaginal Yeast infection, Adult Vaginal yeast infection is a condition that causes soreness, swelling, and redness (inflammation) of the vagina. It also causes vaginal discharge. This is a common condition. Some women get this infection frequently. What are the causes? This condition is caused by a change in the normal balance of the yeast (candida) and bacteria that live in the vagina. This change causes an overgrowth of yeast, which causes the inflammation. What increases the risk? This condition is more likely to develop in:  Women who take antibiotic medicines.  Women who have diabetes.  Women who take birth control pills.  Women who are pregnant.  Women who douche often.  Women who have a weak defense (immune) system.  Women who have been taking steroid medicines for a long time.  Women who frequently wear tight clothing.  What are the signs or symptoms? Symptoms of this condition include:  White, thick vaginal discharge.  Swelling, itching, redness, and irritation of the vagina. The lips of the vagina (vulva) may be affected as well.  Pain or a burning feeling while urinating.  Pain during sex.  How is this diagnosed? This condition is diagnosed with a medical history and physical exam. This will include a pelvic exam. Your health care provider will examine a sample of your vaginal discharge under a microscope. Your health care provider may send this sample for testing to confirm the diagnosis. How is this treated? This condition is treated with medicine. Medicines may be over-the-counter or prescription. You may be told to use one or more of the following:  Medicine that is taken orally.  Medicine that is applied as a cream.  Medicine that is inserted directly into the vagina (suppository).  Follow these instructions at home:  Take  or apply over-the-counter and prescription medicines only as told by your health care provider.  Do not have sex until your health care provider has approved. Tell your sex partner that you have a yeast infection. That person should go to his or her health care provider if he or she develops symptoms.  Do not wear tight clothes, such as pantyhose or tight pants.  Avoid using tampons until your health care provider approves.  Eat more yogurt. This may help to keep your yeast infection from returning.  Try taking a sitz bath to help with discomfort. This is a warm water bath that is taken while you are sitting down. The water should only come up to your hips and should cover your buttocks. Do this 3-4 times per day or as told by your health care provider.  Do not douche.  Wear breathable, cotton underwear.  If you have diabetes, keep your blood sugar levels under control. Contact a health care provider if:  You have a fever.  Your symptoms go away and then return.  Your symptoms do not get better with treatment.  Your symptoms get worse.  You have new symptoms.  You develop blisters in or around your vagina.  You have blood coming from your vagina and it is not your menstrual period.  You develop pain in your abdomen. This information is not intended to replace advice given to you by your health care provider. Make sure you discuss any questions you have with your health care provider. Document Released: 12/17/2004 Document Revised: 08/21/2015 Document Reviewed: 09/10/2014 Elsevier  Interactive Patient Education  [employer].

## 2017-11-17 NOTE — Progress Notes (Signed)
Pre visit review using our clinic review tool, if applicable. No additional management support is needed unless otherwise documented below in the visit note. 

## 2017-11-18 ENCOUNTER — Other Ambulatory Visit: Payer: Self-pay | Admitting: Nurse Practitioner

## 2017-11-18 ENCOUNTER — Other Ambulatory Visit: Payer: Self-pay | Admitting: Anesthesiology

## 2017-11-18 DIAGNOSIS — F418 Other specified anxiety disorders: Secondary | ICD-10-CM

## 2017-11-18 DIAGNOSIS — E1142 Type 2 diabetes mellitus with diabetic polyneuropathy: Secondary | ICD-10-CM

## 2017-11-19 ENCOUNTER — Other Ambulatory Visit: Payer: Self-pay | Admitting: Internal Medicine

## 2017-11-19 DIAGNOSIS — K219 Gastro-esophageal reflux disease without esophagitis: Secondary | ICD-10-CM

## 2017-11-19 MED ORDER — DEXLANSOPRAZOLE 60 MG PO CPDR: 1.0000 | DELAYED_RELEASE_CAPSULE | Freq: Every day | ORAL | 1 refills | Status: DC

## 2017-11-23 DIAGNOSIS — L97521 Non-pressure chronic ulcer of other part of left foot limited to breakdown of skin: Secondary | ICD-10-CM | POA: Diagnosis not present

## 2017-11-23 DIAGNOSIS — E1143 Type 2 diabetes mellitus with diabetic autonomic (poly)neuropathy: Secondary | ICD-10-CM | POA: Diagnosis not present

## 2017-11-24 ENCOUNTER — Ambulatory Visit: Payer: PPO | Attending: Nurse Practitioner | Admitting: Nurse Practitioner

## 2017-11-24 ENCOUNTER — Encounter: Payer: Self-pay | Admitting: Nurse Practitioner

## 2017-11-24 VITALS — BP 157/61 | HR 57 | Temp 97.6°F | Resp 16 | Ht 65.5 in | Wt 262.0 lb

## 2017-11-24 DIAGNOSIS — Z7901 Long term (current) use of anticoagulants: Secondary | ICD-10-CM | POA: Insufficient documentation

## 2017-11-24 DIAGNOSIS — E1142 Type 2 diabetes mellitus with diabetic polyneuropathy: Secondary | ICD-10-CM

## 2017-11-24 DIAGNOSIS — Z6841 Body Mass Index (BMI) 40.0 and over, adult: Secondary | ICD-10-CM | POA: Diagnosis not present

## 2017-11-24 DIAGNOSIS — Z79899 Other long term (current) drug therapy: Secondary | ICD-10-CM | POA: Diagnosis not present

## 2017-11-24 DIAGNOSIS — K219 Gastro-esophageal reflux disease without esophagitis: Secondary | ICD-10-CM | POA: Insufficient documentation

## 2017-11-24 DIAGNOSIS — I480 Paroxysmal atrial fibrillation: Secondary | ICD-10-CM | POA: Insufficient documentation

## 2017-11-24 DIAGNOSIS — J4540 Moderate persistent asthma, uncomplicated: Secondary | ICD-10-CM | POA: Insufficient documentation

## 2017-11-24 DIAGNOSIS — G5600 Carpal tunnel syndrome, unspecified upper limb: Secondary | ICD-10-CM | POA: Diagnosis not present

## 2017-11-24 DIAGNOSIS — Z79890 Hormone replacement therapy (postmenopausal): Secondary | ICD-10-CM | POA: Insufficient documentation

## 2017-11-24 DIAGNOSIS — M25552 Pain in left hip: Secondary | ICD-10-CM | POA: Diagnosis not present

## 2017-11-24 DIAGNOSIS — Z5181 Encounter for therapeutic drug level monitoring: Secondary | ICD-10-CM | POA: Insufficient documentation

## 2017-11-24 DIAGNOSIS — Z7984 Long term (current) use of oral hypoglycemic drugs: Secondary | ICD-10-CM | POA: Diagnosis not present

## 2017-11-24 DIAGNOSIS — M25572 Pain in left ankle and joints of left foot: Secondary | ICD-10-CM | POA: Insufficient documentation

## 2017-11-24 DIAGNOSIS — G894 Chronic pain syndrome: Secondary | ICD-10-CM | POA: Diagnosis not present

## 2017-11-24 DIAGNOSIS — E039 Hypothyroidism, unspecified: Secondary | ICD-10-CM | POA: Insufficient documentation

## 2017-11-24 DIAGNOSIS — F418 Other specified anxiety disorders: Secondary | ICD-10-CM | POA: Diagnosis not present

## 2017-11-24 DIAGNOSIS — I482 Chronic atrial fibrillation: Secondary | ICD-10-CM | POA: Insufficient documentation

## 2017-11-24 DIAGNOSIS — G4733 Obstructive sleep apnea (adult) (pediatric): Secondary | ICD-10-CM | POA: Diagnosis not present

## 2017-11-24 DIAGNOSIS — Z79891 Long term (current) use of opiate analgesic: Secondary | ICD-10-CM | POA: Diagnosis not present

## 2017-11-24 MED ORDER — HYDROCODONE-ACETAMINOPHEN 5-325 MG PO TABS: 1.0000 | ORAL_TABLET | Freq: Three times a day (TID) | ORAL | 0 refills | Status: DC | PRN

## 2017-11-24 MED ORDER — GABAPENTIN 800 MG PO TABS: 800.0000 mg | ORAL_TABLET | Freq: Four times a day (QID) | ORAL | 0 refills | Status: DC

## 2017-11-24 MED ORDER — DULOXETINE HCL 60 MG PO CPEP: 60.0000 mg | ORAL_CAPSULE | Freq: Every day | ORAL | 0 refills | Status: DC

## 2017-11-24 NOTE — Progress Notes (Signed)
Patient's Name: [name]  MRN: [id number]  Referring Provider: Orland Mustard *  DOB: 22-Jan-1951  PCP: [name], [name], MD  DOS: 11/24/2017  Note by: Vevelyn Francois NP  Service setting: Ambulatory outpatient  Specialty: Interventional Pain Management  Location: [facility] (AMB) Pain Management Facility    Patient type: Established    Primary Reason(s) for Visit: Encounter for prescription drug management. (Level of risk: moderate)  CC: Leg Pain (neuropathy); Foot Pain (neuropathy left foot is worse ); and Rectal Pain (right cheek s/p fall )  HPI  [name] is a 67 y.o. year old, female patient, who comes today for a medication management evaluation. She has Paroxysmal atrial fibrillation ([place]); CHA2DS2-Vasc Score 5. On Pradaxa; Essential hypertension; Peripheral vascular disease due to secondary diabetes mellitus ([place]); Apnea, sleep; DM (diabetes mellitus) type II, controlled, with peripheral vascular disorder ([place]); Diabetic peripheral neuropathy associated with type 2 diabetes mellitus ([place]); Asthma, moderate persistent, well-controlled; Hypothyroidism, adult; Anxiety and depression; Bladder cystocele; Gastro-esophageal reflux disease without esophagitis; Mixed incontinence; Atrophic vaginitis; Opiate use (15 MME/Day); Other shoulder lesions (Right); Neuropathy of right lateral femoral cutaneous nerve; Morbid obesity with BMI of 40.0-44.9, adult ([place]); Chronic prescription benzodiazepine use; Osteoarthritis, multiple sites; Status post reverse total shoulder replacement; Chronic pain syndrome; Long term current use of opiate analgesic; Chronic right shoulder pain; Abscess of tendon of left foot; Anemia; Asthma; Carpal tunnel syndrome; Cervical radiculitis; Cubital tunnel syndrome on right; Impingement syndrome of shoulder region; Pain in joint involving ankle and foot; Obstructive sleep apnea of adult; Spinal stenosis of thoracic region; Fall with injury; Pain of left hip joint;  Abnormality of gait and mobility; Mass of both adrenal glands ([place]); Fatty liver; Diabetic ulcer of left foot ([place]); Eczema; Adrenal mass ([place]); Chronic atrial fibrillation ([place]); Muscle strain of left hip; Strain of right hip; and Ischial pain, right on their problem list. Her primarily concern today is the Leg Pain (neuropathy); Foot Pain (neuropathy left foot is worse ); and Rectal Pain (right cheek s/p fall )  Pain Assessment: Location: Left, Right Leg(leg and foot pain caused by neuropathy.  butt cheek pain caused by fall ) Radiating: buttocks pain radiating into top of right back of leg, leg pain into left foot.  also ulcer on left heel.   Onset: More than a month ago Duration: Chronic pain Quality: Discomfort, Constant Severity: 5 /10 (subjective, self-reported pain score)  Note: Reported level is compatible with observation.                          Effect on ADL: very difficult to walk short distances.  Timing: Constant Modifying factors: medications  BP: (!) 157/61  HR: (!) 57  [name] was last scheduled for an appointment on 11/18/2017 for medication management. During today's appointment we reviewed [name]'s chronic pain status, as well as her outpatient medication regimen. She denies any new concerns.  The patient  reports that she does not use drugs. Her body mass index is 42.94 kg/m.  Further details on both, my assessment(s), as well as the proposed treatment plan, please see below.  Controlled Substance Pharmacotherapy Assessment REMS (Risk Evaluation and Mitigation Strategy)  Analgesic:Hydrocodone/APAP 5/325 one tablet every 8 hours (15 mg/day) MME/day:15 mg/day  Janett Billow, RN  11/24/2017  1:38 PM  Sign at close encounter Nursing Pain Medication Assessment:  Safety precautions to be maintained throughout the outpatient stay will include: orient to surroundings, keep bed in low position, maintain  call bell within reach at all times, provide assistance  with transfer out of bed and ambulation.  Medication Inspection Compliance: Pill count conducted under aseptic conditions, in front of the patient. Neither the pills nor the bottle was removed from the patient's sight at any time. Once count was completed pills were immediately returned to the patient in their original bottle.  Medication: Hydrocodone/APAP Pill/Patch Count: 48 of 90 pills remain Pill/Patch Appearance: Markings consistent with prescribed medication Bottle Appearance: Standard pharmacy container. Clearly labeled. Filled Date: 08 / 20 / 2019 Last Medication intake:  Today   Pharmacokinetics: Liberation and absorption (onset of action): WNL Distribution (time to peak effect): WNL Metabolism and excretion (duration of action): WNL         Pharmacodynamics: Desired effects: Analgesia: [name] reports >50% benefit. Functional ability: Patient reports that medication allows her to accomplish basic ADLs Clinically meaningful improvement in function (CMIF): Sustained CMIF goals met Perceived effectiveness: Described as relatively effective, allowing for increase in activities of daily living (ADL) Undesirable effects: Side-effects or Adverse reactions: None reported Monitoring: [place] PMP: Online review of the past 84-monthperiod conducted. Compliant with practice rules and regulations Last UDS on record: Summary  Date Value Ref Range Status  08/24/2017 FINAL  Final    Comment:    ==================================================================== TOXASSURE SELECT 13 (MW) ==================================================================== Test                             Result       Flag       Units Drug Present and Declared for Prescription Verification   Alprazolam                     71           EXPECTED   ng/mg creat   Alpha-hydroxyalprazolam        114          EXPECTED   ng/mg creat    Source of alprazolam is a scheduled prescription medication.     Alpha-hydroxyalprazolam is an expected metabolite of alprazolam.   Hydrocodone                    119          EXPECTED   ng/mg creat   Norhydrocodone                 175          EXPECTED   ng/mg creat    Sources of hydrocodone include scheduled prescription    medications. Norhydrocodone is an expected metabolite of    hydrocodone. ==================================================================== Test                      Result    Flag   Units      Ref Range   Creatinine              63               mg/dL      >=20 ==================================================================== Declared Medications:  The flagging and interpretation on this report are based on the  following declared medications.  Unexpected results may arise from  inaccuracies in the declared medications.  **Note: The testing scope of this panel includes these medications:  Alprazolam  Hydrocodone (Hydrocodone-Acetaminophen)  **Note: The testing scope of this panel does not include following  reported medications:  Acetaminophen (  Hydrocodone-Acetaminophen)  Albuterol  Amlodipine  Bupropion  Clobetasol  Cyanocobalamin  Dabigatran (Pradaxa)  Dexlansoprazole  Duloxetine  Estradiol  Ferrous Gluconate  Fluconazole  Fluticasone (Advair)  Gabapentin  Iron (Ferrous Sulfate)  Levocetirizine  Levofloxacin  Levothyroxine  Metformin  Metoprolol  Mirabegron  Montelukast  Multivitamin  Ondansetron  Oxybutynin  Potassium  Quinapril  Salmeterol (Advair)  Topical  Triamcinolone ==================================================================== For clinical consultation, please call [phone]. ====================================================================    UDS interpretation: Compliant          Medication Assessment Form: Reviewed. Patient indicates being compliant with therapy Treatment compliance: Compliant Risk Assessment Profile: Aberrant behavior: See prior evaluations. None  observed or detected today Comorbid factors increasing risk of overdose: See prior notes. No additional risks detected today Opioid risk tool (ORT) (Total Score):   Personal History of Substance Abuse (SUD-Substance use disorder):  Alcohol:    Illegal Drugs:    Rx Drugs:    ORT Risk Level calculation:   Risk of substance use disorder (SUD): Low  ORT Scoring interpretation table:  Score <3 = Low Risk for SUD  Score between 4-7 = Moderate Risk for SUD  Score >8 = High Risk for Opioid Abuse   Risk Mitigation Strategies:  Patient Counseling: Covered Patient-Prescriber Agreement (PPA): Present and active  Notification to other healthcare providers: Done  Pharmacologic Plan: No change in therapy, at this time.             Laboratory Chemistry  Inflammation Markers (CRP: Acute Phase) (ESR: Chronic Phase) Lab Results  Component Value Date   CRP 14.7 (H) 09/08/2016   ESRSEDRATE 58 (H) 09/08/2016                         Rheumatology Markers No results found for: RF, ANA, LABURIC, URICUR, LYMEIGGIGMAB, LYMEABIGMQN, HLAB27                      Renal Function Markers Lab Results  Component Value Date   BUN 20 06/24/2017   CREATININE 0.82 06/24/2017   BCR 20 10/10/2014   GFRAA >60 01/07/2017   GFRNONAA >60 01/07/2017                             Hepatic Function Markers Lab Results  Component Value Date   AST 12 05/10/2017   ALT 9 05/10/2017   ALBUMIN 3.9 05/10/2017   ALKPHOS 64 05/10/2017   LIPASE 120 08/12/2012                        Electrolytes Lab Results  Component Value Date   NA 141 06/24/2017   K 4.5 06/24/2017   CL 104 06/24/2017   CALCIUM 9.1 06/24/2017   MG 1.8 04/17/2015   PHOS 3.4 08/12/2012                        Neuropathy Markers Lab Results  Component Value Date   VITAMINB12 502 05/10/2017   HGBA1C 6.1 (A) 11/17/2017                        Bone Pathology Markers Lab Results  Component Value Date   VD25OH 32.15 05/10/2017                          Coagulation Parameters Lab Results  Component Value Date   INR 1.18 09/26/2015   LABPROT 15.2 (H) 09/26/2015   APTT 42 (H) 06/24/2015   PLT 271.0 05/10/2017                        Cardiovascular Markers Lab Results  Component Value Date   BNP 216.0 (H) 09/30/2016   TROPONINI <0.03 09/30/2016   HGB 10.9 (L) 05/10/2017   HCT 33.9 (L) 05/10/2017                         CA Markers No results found for: CEA, CA125, LABCA2                      Note: Lab results reviewed.  Recent Diagnostic Imaging Results  CT ADRENAL ABD WO CLINICAL DATA:  Adrenal mass.  EXAM: CT ABDOMEN WITHOUT CONTRAST  TECHNIQUE: Multidetector CT imaging of the abdomen was performed following the standard protocol without IV contrast.  COMPARISON:  CT scan of August 23, 2012.  MRI of December 14, 2014.  FINDINGS: Lower chest: No acute abnormality.  Hepatobiliary: No focal liver abnormality is seen. Status post cholecystectomy. No biliary dilatation.  Pancreas: Unremarkable. No pancreatic ductal dilatation or surrounding inflammatory changes.  Spleen: Normal in size without focal abnormality.  Adrenals/Urinary Tract: Stable bilateral adrenal masses are noted with macroscopic fat present consistent with myelolipomas. The right lesion measures 7.6 cm, and the left lesion measures 9.8 cm. Bilateral renal cortical scarring is noted. Nonobstructive left renal calculus is noted. No hydronephrosis or renal obstruction is noted.  Stomach/Bowel: Status post gastric surgery. No definite abnormality seen involving the visualized large and small bowel. Visualized portion of appendix appears normal.  Vascular/Lymphatic: No significant vascular findings are present. No enlarged abdominal or pelvic lymph nodes.  Other: No abdominal wall hernia or abnormality.  Musculoskeletal: Multilevel degenerative disc disease is noted in the lumbar spine. No acute osseous abnormality is  noted.  IMPRESSION: Stable bilateral adrenal myelolipomas are noted. No further follow-up is required.  Nonobstructive left renal calculus is noted. No hydronephrosis or renal obstruction is noted. Bilateral renal cortical scarring is noted.  Electronically Signed   By: Marijo Conception, M.D.   On: 08/12/2017 13:05  Complexity Note: Imaging results reviewed. Results shared with [name], using Layman's terms.                         Meds   Current Outpatient Medications:  .  albuterol (PROVENTIL HFA;VENTOLIN HFA) 108 (90 Base) MCG/ACT inhaler, Inhale 1-2 puffs into the lungs every 4 (four) hours as needed for wheezing or shortness of breath. (Patient taking differently: Inhale 2 puffs into the lungs every 4 (four) hours as needed for wheezing or shortness of breath. ), Disp: 8 g, Rfl: 2 .  albuterol (PROVENTIL) (2.5 MG/3ML) 0.083% nebulizer solution, Take 3 mLs (2.5 mg total) by nebulization every 6 (six) hours as needed for wheezing or shortness of breath., Disp: 150 mL, Rfl: 1 .  ALPRAZolam (XANAX) 0.5 MG tablet, Take 1 tablet (0.5 mg total) by mouth 2 (two) times daily as needed for anxiety., Disp: 60 tablet, Rfl: 2 .  amLODipine (NORVASC) 5 MG tablet, Take 1 tablet (5 mg total) by mouth daily., Disp: 90 tablet, Rfl: 3 .  benzonatate (TESSALON) 200 MG capsule, Take 1 capsule (200 mg total) by mouth 3 (three) times daily as needed for cough., Disp:  30 capsule, Rfl: 0 .  buPROPion (WELLBUTRIN XL) 300 MG 24 hr tablet, Take 1 tablet (300 mg total) by mouth daily., Disp: 30 tablet, Rfl: 11 .  clobetasol cream (TEMOVATE) 6.06 %, Apply 1 application topically 2 (two) times daily. To arms, Disp: 60 g, Rfl: 0 .  cyanocobalamin 500 MCG tablet, Take 500 mcg by mouth daily., Disp: , Rfl:  .  cyclobenzaprine (FLEXERIL) 10 MG tablet, Take 10 mg by mouth as needed., Disp: , Rfl:  .  dabigatran (PRADAXA) 150 MG CAPS capsule, Take 1 capsule (150 mg total) by mouth 2 (two) times daily., Disp: 180  capsule, Rfl: 2 .  dexlansoprazole (DEXILANT) 60 MG capsule, Take 1 capsule (60 mg total) by mouth daily. Minutes before meal (lunch), Disp: 90 capsule, Rfl: 1 .  diclofenac sodium (VOLTAREN) 1 % GEL, Apply 1 application topically as needed., Disp: , Rfl:  .  DULoxetine (CYMBALTA) 60 MG capsule, Take 1 capsule (60 mg total) by mouth daily., Disp: 90 capsule, Rfl: 0 .  estradiol (ESTRACE) 0.1 MG/GM vaginal cream, Place 1 Applicatorful vaginally 3 (three) times a week., Disp: 42.5 g, Rfl: 12 .  ferrous sulfate 325 (65 FE) MG tablet, Take 325 mg by mouth daily with breakfast., Disp: , Rfl:  .  Fluticasone-Salmeterol (ADVAIR) 250-50 MCG/DOSE AEPB, Inhale 1 puff into the lungs 2 (two) times daily., Disp: , Rfl:  .  hydrochlorothiazide (HYDRODIURIL) 12.5 MG tablet, Take 1 tablet (12.5 mg total) by mouth daily. In am, Disp: 90 tablet, Rfl: 1 .  [START ON 02/07/2018] HYDROcodone-acetaminophen (NORCO/VICODIN) 5-325 MG tablet, Take 1 tablet by mouth every 8 (eight) hours as needed for severe pain., Disp: 90 tablet, Rfl: 0 .  levocetirizine (XYZAL) 5 MG tablet, TAKE 1 TABLET BY MOUTH EVERY EVENING, Disp: 90 tablet, Rfl: 3 .  levothyroxine (SYNTHROID, LEVOTHROID) 175 MCG tablet, Take 1 tablet (175 mcg total) by mouth daily before breakfast., Disp: 90 tablet, Rfl: 3 .  metFORMIN (GLUCOPHAGE) 1000 MG tablet, TAKE ONE TABLET BY MOUTH TWICE DAILY (Patient taking differently: TAKE ONE TABLET IN THE MORNING AND HALF TABLET AT NIGHT), Disp: 180 tablet, Rfl: 1 .  metoprolol succinate (TOPROL-XL) 100 MG 24 hr tablet, Take 1 tablet (100 mg total) by mouth daily. Take with or immediately following a meal., Disp: 90 tablet, Rfl: 0 .  mirabegron ER (MYRBETRIQ) 50 MG TB24 tablet, Take 1 tablet (50 mg total) by mouth daily., Disp: 30 tablet, Rfl: 6 .  montelukast (SINGULAIR) 10 MG tablet, TAKE ONE TABLET BY MOUTH EVERY DAY, Disp: 90 tablet, Rfl: 3 .  Multiple Vitamin (MULTIVITAMIN WITH MINERALS) TABS tablet, Take 1 tablet by  mouth daily., Disp: , Rfl:  .  mupirocin ointment (BACTROBAN) 2 %, Apply 1 application topically 2 (two) times daily. Skin wounds, Disp: 30 g, Rfl: 0 .  nystatin cream (MYCOSTATIN), Apply 1 application topically 2 (two) times daily. Mouth prn for 7 days as needed, Disp: 30 g, Rfl: 11 .  ondansetron (ZOFRAN) 4 MG tablet, Take 1 tablet (4 mg total) every 8 (eight) hours as needed by mouth for nausea or vomiting., Disp: 20 tablet, Rfl: 0 .  oxybutynin (DITROPAN XL) 15 MG 24 hr tablet, TAKE ONE TABLET BY MOUTH AT BEDTIME, Disp: 90 tablet, Rfl: 3 .  quinapril (ACCUPRIL) 40 MG tablet, Take 1 tablet (40 mg total) by mouth daily., Disp: 90 tablet, Rfl: 3 .  triamcinolone cream (KENALOG) 0.1 %, Apply 1 application 2 (two) times daily as needed topically., Disp: 45 g, Rfl:  1 .  zinc oxide 20 % ointment, Apply 1 application topically as needed for irritation., Disp: 56.7 g, Rfl: 0 .  [START ON 12/08/2017] gabapentin (NEURONTIN) 800 MG tablet, Take 1 tablet (800 mg total) by mouth 4 (four) times daily., Disp: 360 tablet, Rfl: 0 .  [START ON 01/08/2018] HYDROcodone-acetaminophen (NORCO/VICODIN) 5-325 MG tablet, Take 1 tablet by mouth every 8 (eight) hours as needed for severe pain., Disp: 90 tablet, Rfl: 0 .  [START ON 12/09/2017] HYDROcodone-acetaminophen (NORCO/VICODIN) 5-325 MG tablet, Take 1 tablet by mouth every 8 (eight) hours as needed for severe pain., Disp: 90 tablet, Rfl: 0  ROS  Constitutional: Denies any fever or chills Gastrointestinal: No reported hemesis, hematochezia, vomiting, or acute GI distress Musculoskeletal: Denies any acute onset joint swelling, redness, loss of ROM, or weakness Neurological: No reported episodes of acute onset apraxia, aphasia, dysarthria, agnosia, amnesia, paralysis, loss of coordination, or loss of consciousness  Allergies  [name] is allergic to penicillins; aspirin; morphine and related; and terramycin [oxytetracycline].  [place]  Drug: [name]  reports that  she does not use drugs. Alcohol:  reports that she does not drink alcohol. Tobacco:  reports that she has never smoked. She has never used smokeless tobacco. Medical:  has a past medical history of Abnormal antibody titer, Anxiety and depression, Arthritis, Asthma, Cystocele, Depression, Diabetes ([place]), DVT (deep venous thrombosis) ([place]), Dysrhythmia, Edema, GERD (gastroesophageal reflux disease), Heart murmur, History of IBS, History of methicillin resistant staphylococcus aureus (MRSA) (2008), Hyperlipidemia, Hypertension, Hypothyroidism, Insomnia, Kidney stone, Neuropathy involving both lower extremities, Obesity, Class II, BMI 35-39.9, with comorbidity, Paroxysmal atrial fibrillation ([place]) (2007), Peripheral vascular disease ([place]), Sleep apnea, Urinary incontinence, and Venous stasis dermatitis of both lower extremities. Surgical: [name]  has a past surgical history that includes Ankle surgery (Right); Vaginal hysterectomy; Sleeve Gastroplasty; Lithotripsy; Kidney surgery (Right); transthoracic echocardiogram (08/03/2013); transesophageal echocardiogram (08/08/2013); Tonsillectomy; Total knee arthroplasty; Hiatal hernia repair; Cholecystectomy; I&D extremity (Left, 06/27/2015); Application if wound vac (Left, 06/27/2015); removal of left hematoma (Left); NM GATED MYOVIEW [facility] HX) (February 2017); Reverse shoulder arthroplasty (Right, 10/08/2015); Hernia repair; Ulnar nerve transposition (Right, 08/06/2016); arm surgery; Irrigation and debridement abscess (Left, 09/07/2016); Incision and drainage of wound (Left, 09/25/2016); Application if wound vac (Left, 09/25/2016); Colonoscopy; Upper gi endoscopy; Esophagogastroduodenoscopy (egd) with propofol (N/A, 01/11/2017); Colonoscopy with propofol (N/A, 01/11/2017); Colonoscopy with propofol (N/A, 02/22/2017); OTHER SURGICAL HISTORY; Total shoulder replacement (Right); Joint replacement (2013); and Colonoscopy with propofol (N/A, 05/31/2017). Family: family history  includes Alcohol abuse in her father; Arthritis in her maternal grandfather, maternal grandmother, mother, paternal grandfather, and paternal grandmother; Breast cancer in her maternal aunt; Cancer in her father and maternal aunt; Cerebral aneurysm in her father; Diabetes in her father, maternal grandmother, and paternal grandmother; Heart disease in her maternal grandfather; Hypertension in her father, maternal grandfather, maternal grandmother, paternal grandfather, and paternal grandmother; Kidney disease in her mother; Mental illness in her sister; Peripheral vascular disease in her father; Skin cancer in her father; Varicose Veins in her mother.  Constitutional Exam  General appearance: Well nourished, well developed, and well hydrated. In no apparent acute distress Vitals:   11/24/17 1301  BP: (!) 157/61  Pulse: (!) 57  Resp: 16  Temp: 97.6 F (36.4 C)  TempSrc: Oral  SpO2: 97%  Weight: 262 lb (118.8 kg)  Height: 5' 5.5" (1.664 m)  Psych/Mental status: Alert, oriented x 3 (person, place, & time)       Eyes: PERLA Respiratory: No evidence of acute respiratory  distress  Cervical Spine Area Exam  Skin & Axial Inspection: No masses, redness, edema, swelling, or associated skin lesions Alignment: Symmetrical Functional ROM: Unrestricted ROM      Stability: No instability detected Muscle Tone/Strength: Functionally intact. No obvious neuro-muscular anomalies detected. Sensory (Neurological): Unimpaired Palpation: No palpable anomalies              Upper Extremity (UE) Exam    Side: Right upper extremity  Side: Left upper extremity  Skin & Extremity Inspection: Skin color, temperature, and hair growth are WNL. No peripheral edema or cyanosis. No masses, redness, swelling, asymmetry, or associated skin lesions. No contractures.  Skin & Extremity Inspection: Skin color, temperature, and hair growth are WNL. No peripheral edema or cyanosis. No masses, redness, swelling, asymmetry, or  associated skin lesions. No contractures.  Functional ROM: Unrestricted ROM          Functional ROM: Unrestricted ROM          Muscle Tone/Strength: Functionally intact. No obvious neuro-muscular anomalies detected.  Muscle Tone/Strength: Functionally intact. No obvious neuro-muscular anomalies detected.  Sensory (Neurological): Unimpaired          Sensory (Neurological): Unimpaired          Palpation: No palpable anomalies              Palpation: No palpable anomalies              Provocative Test(s):  Phalen's test: deferred Tinel's test: deferred Apley's scratch test (touch opposite shoulder):  Action 1 (Across chest): deferred Action 2 (Overhead): deferred Action 3 (LB reach): deferred   Provocative Test(s):  Phalen's test: deferred Tinel's test: deferred Apley's scratch test (touch opposite shoulder):  Action 1 (Across chest): deferred Action 2 (Overhead): deferred Action 3 (LB reach): deferred    Thoracic Spine Area Exam  Skin & Axial Inspection: No masses, redness, or swelling Alignment: Symmetrical Functional ROM: Unrestricted ROM Stability: No instability detected Muscle Tone/Strength: Functionally intact. No obvious neuro-muscular anomalies detected. Sensory (Neurological): Unimpaired Muscle strength & Tone: No palpable anomalies  Lumbar Spine Area Exam  Skin & Axial Inspection: No masses, redness, or swelling Alignment: Symmetrical Functional ROM: Unrestricted ROM       Stability: No instability detected Muscle Tone/Strength: Functionally intact. No obvious neuro-muscular anomalies detected. Sensory (Neurological): Unimpaired Palpation: No palpable anomalies       Provocative Tests: Hyperextension/rotation test: deferred today       Lumbar quadrant test (Kemp's test): deferred today       Lateral bending test: deferred today       Patrick's Maneuver: deferred today                   FABER test: deferred today                   S-I anterior  distraction/compression test: deferred today         S-I lateral compression test: deferred today         S-I Thigh-thrust test: deferred today         S-I Gaenslen's test: deferred today          Gait & Posture Assessment  Ambulation: Patient ambulates using a walker Gait: Relatively normal for age and body habitus Posture: WNL   Lower Extremity Exam    Side: Right lower extremity  Side: Left lower extremity  Stability: No instability observed          Stability: No instability observed  Skin & Extremity Inspection: Skin color, temperature, and hair growth are WNL. No peripheral edema or cyanosis. No masses, redness, swelling, asymmetry, or associated skin lesions. No contractures.  Skin & Extremity Inspection: Bandaid  Functional ROM: Unrestricted ROM                  Functional ROM: Unrestricted ROM                  Muscle Tone/Strength: Functionally intact. No obvious neuro-muscular anomalies detected.  Muscle Tone/Strength: Functionally intact. No obvious neuro-muscular anomalies detected.  Sensory (Neurological): Unimpaired  Sensory (Neurological): Unimpaired  Palpation: No palpable anomalies  Palpation: No palpable anomalies   Assessment  Primary Diagnosis & Pertinent Problem List: The primary encounter diagnosis was Diabetic peripheral neuropathy associated with type 2 diabetes mellitus ([place]). Diagnoses of Pain of joint of left ankle and foot, Pain of left hip joint, Chronic pain syndrome, and Depression with anxiety were also pertinent to this visit.  Status Diagnosis  Controlled Controlled Controlled 1. Diabetic peripheral neuropathy associated with type 2 diabetes mellitus (HCC)   2. Pain of joint of left ankle and foot   3. Pain of left hip joint   4. Chronic pain syndrome   5. Depression with anxiety     Problems updated and reviewed during this visit: No problems updated. Plan of Care  Pharmacotherapy (Medications Ordered): Meds ordered this encounter   Medications  . HYDROcodone-acetaminophen (NORCO/VICODIN) 5-325 MG tablet    Sig: Take 1 tablet by mouth every 8 (eight) hours as needed for severe pain.    Dispense:  90 tablet    Refill:  0    Do not add this medication to the electronic "Automatic Refill" notification system. Do not fill until:02/07/2018 To last until:03/09/2018    Order Specific Question:   Supervising Provider    Answer:   Milinda Pointer [phone]  . gabapentin (NEURONTIN) 800 MG tablet    Sig: Take 1 tablet (800 mg total) by mouth 4 (four) times daily.    Dispense:  360 tablet    Refill:  0    Do not place this medication, or any other prescription from our practice, on "Automatic Refill". Patient may have prescription filled one day early if pharmacy is closed on scheduled refill date.    Order Specific Question:   Supervising Provider    Answer:   Milinda Pointer [phone]  . HYDROcodone-acetaminophen (NORCO/VICODIN) 5-325 MG tablet    Sig: Take 1 tablet by mouth every 8 (eight) hours as needed for severe pain.    Dispense:  90 tablet    Refill:  0    Do not add this medication to the electronic "Automatic Refill" notification system. Do not fill until: 01/08/2018 To last until: 02/07/2018    Order Specific Question:   Supervising Provider    Answer:   Milinda Pointer [phone]  . HYDROcodone-acetaminophen (NORCO/VICODIN) 5-325 MG tablet    Sig: Take 1 tablet by mouth every 8 (eight) hours as needed for severe pain.    Dispense:  90 tablet    Refill:  0    Do not add this medication to the electronic "Automatic Refill" notification system. Do not fill until:12/09/2017 To last until: 01/08/2018    Order Specific Question:   Supervising Provider    Answer:   Milinda Pointer [phone]  . DULoxetine (CYMBALTA) 60 MG capsule    Sig: Take 1 capsule (60 mg total) by mouth daily.    Dispense:  90 capsule  Refill:  0    Do not place this medication, or any other prescription from our practice, on "Automatic  Refill". Patient may have prescription filled one day early if pharmacy is closed on scheduled refill date.    Order Specific Question:   Supervising Provider    Answer:   Milinda Pointer [[id number]]   New Prescriptions   No medications on file   Medications administered today: [name] had no medications administered during this visit. Lab-work, procedure(s), and/or referral(s): No orders of the defined types were placed in this encounter.  Imaging and/or referral(s): None  Interventional therapies: Planned, scheduled, and/or pending:  Continue with current regimen (Always stop Pradaxa x 5 days prior to any blocks.)   Considering:  Diagnostic Right suprascapular nerve block Diagnostic right intra-articular knee injection Possible series of 5 Hyalgan knee injections Diagnostic right-sided genicular nerve block Possible right-sided genicular nerve radiofrequencyablation  Palliative right intra-articular shoulder joint injection Diagnostic right-sided suprascapular nerve block Possible right-sided suprascapular nerve radiofrequencyablation  Palliative right lateral femoral cutaneous nerve block Palliative repeat right sided lateral femoral continuous nerve radiofrequencyablation   Palliative PRN treatment(s):  Diagnostic right intra-articular knee injection Possible series of 5 Hyalgan knee injections Diagnostic right-sided genicular nerve block Palliative right intra-articular shoulder joint injection Diagnostic right-sided suprascapular nerve block Palliative right lateral femoral cutaneous nerve block Palliative repeat right sided lateral femoral continuous nerve radiofrequencyablation     Provider-requested follow-up: Return in about 3 months (around 02/23/2018) for MedMgmt with Me Donella Stade [facility] Pace).  Future Appointments  Date Time Provider [place]  12/09/2017  3:45 PM Ernestine Conrad, Hunt Oris, PA-C [facility] None  02/22/2018  1:30 PM Vevelyn Francois, NP [facility] None  03/09/2018  8:30 AM [name], [name], MD [facility]   Primary Care Physician: [name], [name], MD Location: [facility] Note by: Vevelyn Francois NP Date: 11/24/2017; Time: 3:29 PM  Pain Score Disclaimer: We use the NRS-11 scale. This is a self-reported, subjective measurement of pain severity with only modest accuracy. It is used primarily to identify changes within a particular patient. It must be understood that outpatient pain scales are significantly less accurate that those used for research, where they can be applied under ideal controlled circumstances with minimal exposure to variables. In reality, the score is likely to be a combination of pain intensity and pain affect, where pain affect describes the degree of emotional arousal or changes in action readiness caused by the sensory experience of pain. Factors such as social and work situation, setting, emotional state, anxiety levels, expectation, and prior pain experience may influence pain perception and show large inter-individual differences that may also be affected by time variables.  Patient instructions provided during this appointment: Patient Instructions   ____________________________________________________________________________________________  Medication Rules  Applies to: All patients receiving prescriptions (written or electronic).  Pharmacy of record: Pharmacy where electronic prescriptions will be sent. If written prescriptions are taken to a different pharmacy, please inform the nursing staff. The pharmacy listed in the electronic medical record should be the one where you would like electronic prescriptions to be sent.  Prescription refills: Only during scheduled appointments. Applies to both, written and electronic prescriptions.  NOTE: The following applies primarily to controlled substances (Opioid* Pain Medications).   Patient's  responsibilities: 1. Pain Pills: Bring all pain pills to every appointment (except for procedure appointments). 2. Pill Bottles: Bring pills in original pharmacy bottle. Always bring newest bottle. Bring bottle, even if empty. 3. Medication refills: You are  responsible for knowing and keeping track of what medications you need refilled. The day before your appointment, write a list of all prescriptions that need to be refilled. Bring that list to your appointment and give it to the admitting nurse. Prescriptions will be written only during appointments. If you forget a medication, it will not be "Called in", "Faxed", or "electronically sent". You will need to get another appointment to get these prescribed. 4. Prescription Accuracy: You are responsible for carefully inspecting your prescriptions before leaving our office. Have the discharge nurse carefully go over each prescription with you, before taking them home. Make sure that your name is accurately spelled, that your address is correct. Check the name and dose of your medication to make sure it is accurate. Check the number of pills, and the written instructions to make sure they are clear and accurate. Make sure that you are given enough medication to last until your next medication refill appointment. 5. Taking Medication: Take medication as prescribed. Never take more pills than instructed. Never take medication more frequently than prescribed. Taking less pills or less frequently is permitted and encouraged, when it comes to controlled substances (written prescriptions).  6. Inform other Doctors: Always inform, all of your healthcare providers, of all the medications you take. 7. Pain Medication from other Providers: You are not allowed to accept any additional pain medication from any other Doctor or Healthcare provider. There are two exceptions to this rule. (see below) In the event that you require additional pain medication, you are responsible  for notifying us, as stated below. 8. Medication Agreement: You are responsible for carefully reading and following our Medication Agreement. This must be signed before receiving any prescriptions from our practice. Safely store a copy of your signed Agreement. Violations to the Agreement will result in no further prescriptions. (Additional copies of our Medication Agreement are available upon request.) 9. Laws, Rules, & Regulations: All patients are expected to follow all Federal and [employer], Rules, [employer]. Ignorance of the Laws does not constitute a valid excuse. The use of any illegal substances is prohibited. 10. Adopted CDC guidelines & recommendations: Target dosing levels will be at or below 60 MME/day. Use of benzodiazepines** is not recommended.  Exceptions: There are only two exceptions to the rule of not receiving pain medications from other Healthcare Providers. 1. Exception #1 (Emergencies): In the event of an emergency (i.e.: accident requiring emergency care), you are allowed to receive additional pain medication. However, you are responsible for: As soon as you are able, call our office [phone], at any time of the day or night, and leave a message stating your name, the date and nature of the emergency, and the name and dose of the medication prescribed. In the event that your call is answered by a member of our staff, make sure to document and save the date, time, and the name of the person that took your information.  2. Exception #2 (Planned Surgery): In the event that you are scheduled by another doctor or dentist to have any type of surgery or procedure, you are allowed (for a period no longer than 30 days), to receive additional pain medication, for the acute post-op pain. However, in this case, you are responsible for picking up a copy of our "Post-op Pain Management for Surgeons" handout, and giving it to your surgeon or dentist. This document is available at  our office, and does not require an appointment to obtain it. Simply go  to our office during business hours (Monday-Thursday from 8:00 AM to 4:00 PM) (Friday 8:00 AM to 12:00 Noon) or if you have a scheduled appointment with [place], prior to your surgery, and ask for it by name. In addition, you will need to provide [place] with your name, name of your surgeon, type of surgery, and date of procedure or surgery.  *Opioid medications include: morphine, codeine, oxycodone, oxymorphone, hydrocodone, hydromorphone, meperidine, tramadol, tapentadol, buprenorphine, fentanyl, methadone. **Benzodiazepine medications include: diazepam (Valium), alprazolam (Xanax), clonazepam (Klonopine), lorazepam (Ativan), clorazepate (Tranxene), chlordiazepoxide (Librium), estazolam (Prosom), oxazepam (Serax), temazepam (Restoril), triazolam (Halcion) (Last updated: 05/20/2017) ____________________________________________________________________________________________   BMI Assessment: Estimated body mass index is 42.94 kg/m as calculated from the following:   Height as of this encounter: 5' 5.5" (1.664 m).   Weight as of this encounter: 262 lb (118.8 kg).  BMI interpretation table: BMI level Category Range association with higher incidence of chronic pain  <18 kg/m2 Underweight   18.5-24.9 kg/m2 Ideal body weight   25-29.9 kg/m2 Overweight Increased incidence by 20%  30-34.9 kg/m2 Obese (Class I) Increased incidence by 68%  35-39.9 kg/m2 Severe obesity (Class II) Increased incidence by 136%  >40 kg/m2 Extreme obesity (Class III) Increased incidence by 254%   Patient's current BMI Ideal Body weight  Body mass index is 42.94 kg/m. Ideal body weight: 58.2 kg (128 lb 3.2 oz) Adjusted ideal body weight: 82.4 kg (181 lb 11.5 oz)   BMI Readings from Last 4 Encounters:  11/24/17 42.94 kg/m  11/17/17 42.93 kg/m  10/04/17 43.43 kg/m  09/15/17 44.07 kg/m   Wt Readings from Last 4 Encounters:  11/24/17 262 lb (118.8 kg)   11/17/17 258 lb (117 kg)  10/04/17 261 lb (118.4 kg)  09/15/17 264 lb 12.8 oz (120.1 kg)

## 2017-11-24 NOTE — Progress Notes (Signed)
Nursing Pain Medication Assessment:  Safety precautions to be maintained throughout the outpatient stay will include: orient to surroundings, keep bed in low position, maintain call bell within reach at all times, provide assistance with transfer out of bed and ambulation.  Medication Inspection Compliance: Pill count conducted under aseptic conditions, in front of the patient. Neither the pills nor the bottle was removed from the patient's sight at any time. Once count was completed pills were immediately returned to the patient in their original bottle.  Medication: Hydrocodone/APAP Pill/Patch Count: 48 of 90 pills remain Pill/Patch Appearance: Markings consistent with prescribed medication Bottle Appearance: Standard pharmacy container. Clearly labeled. Filled Date: 08 / 20 / 2019 Last Medication intake:  Today

## 2017-11-24 NOTE — Patient Instructions (Signed)
____________________________________________________________________________________________  Medication Rules  Applies to: All patients receiving prescriptions (written or electronic).  Pharmacy of record: Pharmacy where electronic prescriptions will be sent. If written prescriptions are taken to a different pharmacy, please inform the nursing staff. The pharmacy listed in the electronic medical record should be the one where you would like electronic prescriptions to be sent.  Prescription refills: Only during scheduled appointments. Applies to both, written and electronic prescriptions.  NOTE: The following applies primarily to controlled substances (Opioid* Pain Medications).   Patient's responsibilities: 1. Pain Pills: Bring all pain pills to every appointment (except for procedure appointments). 2. Pill Bottles: Bring pills in original pharmacy bottle. Always bring newest bottle. Bring bottle, even if empty. 3. Medication refills: You are responsible for knowing and keeping track of what medications you need refilled. The day before your appointment, write a list of all prescriptions that need to be refilled. Bring that list to your appointment and give it to the admitting nurse. Prescriptions will be written only during appointments. If you forget a medication, it will not be "Called in", "Faxed", or "electronically sent". You will need to get another appointment to get these prescribed. 4. Prescription Accuracy: You are responsible for carefully inspecting your prescriptions before leaving our office. Have the discharge nurse carefully go over each prescription with you, before taking them home. Make sure that your name is accurately spelled, that your address is correct. Check the name and dose of your medication to make sure it is accurate. Check the number of pills, and the written instructions to make sure they are clear and accurate. Make sure that you are given enough medication to last  until your next medication refill appointment. 5. Taking Medication: Take medication as prescribed. Never take more pills than instructed. Never take medication more frequently than prescribed. Taking less pills or less frequently is permitted and encouraged, when it comes to controlled substances (written prescriptions).  6. Inform other Doctors: Always inform, all of your healthcare providers, of all the medications you take. 7. Pain Medication from other Providers: You are not allowed to accept any additional pain medication from any other Doctor or Healthcare provider. There are two exceptions to this rule. (see below) In the event that you require additional pain medication, you are responsible for notifying us, as stated below. 8. Medication Agreement: You are responsible for carefully reading and following our Medication Agreement. This must be signed before receiving any prescriptions from our practice. Safely store a copy of your signed Agreement. Violations to the Agreement will result in no further prescriptions. (Additional copies of our Medication Agreement are available upon request.) 9. Laws, Rules, & Regulations: All patients are expected to follow all Federal and State Laws, Statutes, Rules, & Regulations. Ignorance of the Laws does not constitute a valid excuse. The use of any illegal substances is prohibited. 10. Adopted CDC guidelines & recommendations: Target dosing levels will be at or below 60 MME/day. Use of benzodiazepines** is not recommended.  Exceptions: There are only two exceptions to the rule of not receiving pain medications from other Healthcare Providers. 1. Exception #1 (Emergencies): In the event of an emergency (i.e.: accident requiring emergency care), you are allowed to receive additional pain medication. However, you are responsible for: As soon as you are able, call our office [phone], at any time of the day or night, and leave a message stating your name, the  date and nature of the emergency, and the name and dose of the medication   prescribed. In the event that your call is answered by a member of our staff, make sure to document and save the date, time, and the name of the person that took your information.  2. Exception #2 (Planned Surgery): In the event that you are scheduled by another doctor or dentist to have any type of surgery or procedure, you are allowed (for a period no longer than 30 days), to receive additional pain medication, for the acute post-op pain. However, in this case, you are responsible for picking up a copy of our "Post-op Pain Management for Surgeons" handout, and giving it to your surgeon or dentist. This document is available at our office, and does not require an appointment to obtain it. Simply go to our office during business hours (Monday-Thursday from 8:00 AM to 4:00 PM) (Friday 8:00 AM to 12:00 Noon) or if you have a scheduled appointment with [place], prior to your surgery, and ask for it by name. In addition, you will need to provide [place] with your name, name of your surgeon, type of surgery, and date of procedure or surgery.  *Opioid medications include: morphine, codeine, oxycodone, oxymorphone, hydrocodone, hydromorphone, meperidine, tramadol, tapentadol, buprenorphine, fentanyl, methadone. **Benzodiazepine medications include: diazepam (Valium), alprazolam (Xanax), clonazepam (Klonopine), lorazepam (Ativan), clorazepate (Tranxene), chlordiazepoxide (Librium), estazolam (Prosom), oxazepam (Serax), temazepam (Restoril), triazolam (Halcion) (Last updated: 05/20/2017) ____________________________________________________________________________________________   BMI Assessment: Estimated body mass index is 42.94 kg/m as calculated from the following:   Height as of this encounter: 5' 5.5" (1.664 m).   Weight as of this encounter: 262 lb (118.8 kg).  BMI interpretation table: BMI level Category Range association with higher  incidence of chronic pain  <18 kg/m2 Underweight   18.5-24.9 kg/m2 Ideal body weight   25-29.9 kg/m2 Overweight Increased incidence by 20%  30-34.9 kg/m2 Obese (Class I) Increased incidence by 68%  35-39.9 kg/m2 Severe obesity (Class II) Increased incidence by 136%  >40 kg/m2 Extreme obesity (Class III) Increased incidence by 254%   Patient's current BMI Ideal Body weight  Body mass index is 42.94 kg/m. Ideal body weight: 58.2 kg (128 lb 3.2 oz) Adjusted ideal body weight: 82.4 kg (181 lb 11.5 oz)   BMI Readings from Last 4 Encounters:  11/24/17 42.94 kg/m  11/17/17 42.93 kg/m  10/04/17 43.43 kg/m  09/15/17 44.07 kg/m   Wt Readings from Last 4 Encounters:  11/24/17 262 lb (118.8 kg)  11/17/17 258 lb (117 kg)  10/04/17 261 lb (118.4 kg)  09/15/17 264 lb 12.8 oz (120.1 kg)

## 2017-11-25 ENCOUNTER — Other Ambulatory Visit: Payer: Self-pay | Admitting: Internal Medicine

## 2017-11-25 DIAGNOSIS — I482 Chronic atrial fibrillation, unspecified: Secondary | ICD-10-CM

## 2017-11-25 MED ORDER — METOPROLOL SUCCINATE ER 100 MG PO TB24: 100.0000 mg | ORAL_TABLET | Freq: Every day | ORAL | 3 refills | Status: DC

## 2017-12-09 ENCOUNTER — Ambulatory Visit: Payer: PPO | Admitting: Urology

## 2017-12-13 ENCOUNTER — Ambulatory Visit: Payer: PPO | Admitting: Urology

## 2017-12-13 VITALS — BP 135/81 | HR 41 | Ht 65.0 in | Wt 259.1 lb

## 2017-12-13 DIAGNOSIS — N3946 Mixed incontinence: Secondary | ICD-10-CM

## 2017-12-13 DIAGNOSIS — N200 Calculus of kidney: Secondary | ICD-10-CM | POA: Diagnosis not present

## 2017-12-13 DIAGNOSIS — N952 Postmenopausal atrophic vaginitis: Secondary | ICD-10-CM | POA: Diagnosis not present

## 2017-12-13 DIAGNOSIS — R310 Gross hematuria: Secondary | ICD-10-CM | POA: Diagnosis not present

## 2017-12-13 DIAGNOSIS — N390 Urinary tract infection, site not specified: Secondary | ICD-10-CM

## 2017-12-13 LAB — BLADDER SCAN AMB NON-IMAGING: SCAN RESULT: 41

## 2017-12-13 NOTE — Patient Instructions (Signed)
  Apply the vaginal estrogen cream 0.5mg  (pea-sized amount)  just inside the vaginal introitus with a finger-tip on Monday, Wednesday and Friday nights,

## 2017-12-13 NOTE — Progress Notes (Signed)
12/13/2017 1:16 PM   [name] [birth year] [id number]  Referring provider: [name], [name], MD [place], [place] [address]  Chief Complaint  Patient presents with  . Follow-up    HPI: Patient is a 66 year old Caucasian female with a history of urinary incontinence, vaginal atrophy, history of renal stones and rUTI's who presents today for follow up.    Urinary incontinence Patient has been on Myrbetriq 25 mg and oxybutynin XL 15 mg daily.  Her PVR on today's exam is 41 mL.  She states that she is losing urine when she stands.  She is wearing depends daily.  She could not find a pessary that would fit.  She is experiencing urgency x 4-7 (unchanged), frequency x 8  or more (unchanged), is restricting fluids to avoid visits to the restroom, is engaging in toilet mapping, incontinence x 8 or more (unchanged) and nocturia x 0-3 (unchanged).  She has not had PT for her pelvic floor.  She is not interested in surgery.    Vaginal atrophy She is using vaginal estrogen cream three nights weekly.   History of kidney stones She has not had any recent flank pain or gross hematuria.  A CT scan performed in 2014 did not note any nephrolithiasis. CT of the adrenal's on 08/12/2017 noted a nonobstructive left renal calculus is noted. No hydronephrosis or renal obstruction is noted. Bilateral renal cortical scarring is noted.  Recurrent UTIs Risk factors: age, vaginal atrophy and incontinence.   In June 2019, urine culture was positive for E. Coli that was multi-resistant.  In March, urine culture was positive for ESBL Klebsiella.  She states Friday night she noticed a pinkish tinge to her urine.  She also started to experience an increase in nocturia up to 7 times nightly.  She had some doxycycline on hand and started that antibiotic and her symptoms started to improve somewhat.  She is currently having symptoms of weakness her legs being tired and the symptoms have been  occurring for 1 month.   Her UA today is positive for 11-30 WBC's.     PMH: Past Medical History:  Diagnosis Date  . Abnormal antibody titer   . Anxiety and depression   . Arthritis   . Asthma   . Cystocele   . Depression   . Diabetes ([place])   . DVT (deep venous thrombosis) (HCC)    Righ calf  . Dysrhythmia   . Edema   . GERD (gastroesophageal reflux disease)   . Heart murmur    Echocardiogram June 2015: Mild MR (possible vegetation seen on TTE, not seen on TEE), normal LV size with moderate concentric LVH. Normal function EF 60-65%. Normal diastolic function. Mild LA dilation.  Marland Kitchen History of IBS   . History of methicillin resistant staphylococcus aureus (MRSA) 2008  . Hyperlipidemia   . Hypertension   . Hypothyroidism   . Insomnia   . Kidney stone    kidney stones with lithotripsy  . Neuropathy involving both lower extremities   . Obesity, Class II, BMI 35-39.9, with comorbidity   . Paroxysmal atrial fibrillation ([place]) 2007   In June 2015, Cardiac Event Monitor: Mostly SR/sinus arrhythmia with PVCs that are frequent. Short bursts of A. fib lasting several minutes;; CHA2DS2-VASc Score = 5 (age, Female, PVD, DM, HTN)  . Peripheral vascular disease ([place])   . Sleep apnea    use C-PAP  . Urinary incontinence   . Venous stasis dermatitis of both lower extremities  Surgical History: Past Surgical History:  Procedure Laterality Date  . ANKLE SURGERY Right   . APPLICATION OF WOUND VAC Left 06/27/2015   Procedure: APPLICATION OF WOUND VAC ( POSSIBLE ) ;  Surgeon: [name], MD;  Location: [facility] ORS;  Service: Vascular;  Laterality: Left;  . APPLICATION OF WOUND VAC Left 09/25/2016   Procedure: APPLICATION OF WOUND VAC;  Surgeon: [name], DPM;  Location: [facility] ORS;  Service: Podiatry;  Laterality: Left;  . arm surgery     right    . CHOLECYSTECTOMY    . COLONOSCOPY    . COLONOSCOPY WITH PROPOFOL N/A 01/11/2017   Procedure: COLONOSCOPY WITH PROPOFOL;  Surgeon: [name], MD;  Location[facility] END[facility]COPY;  Service: Endoscopy;  Laterality: N/A;  . COLONOSCOPY WITH PROPOFOL N/A 02/22/2017   Procedure: COLONOSCOPY WITH PROPOFOL;  Surgeon: [name], MD;  Location: [facility] ENDOSCOPY;  Service: Endoscopy;  Laterality: N/A;  . COLONOSCOPY WITH PROPOFOL N/A 05/31/2017   Procedure: COLONOSCOPY WITH PROPOFOL;  Surgeon: [name], MD;  Location: [facility] ENDOSCOPY;  Service: Endoscopy;  Laterality: N/A;  . ESOPHAGOGASTRODUODENOSCOPY (EGD) WITH PROPOFOL N/A 01/11/2017   Procedure: ESOPHAGOGASTRODUODENOSCOPY (EGD) WITH PROPOFOL;  Surgeon: [name], MD;  Location: [facility] ENDOSCOPY;  Service: Endoscopy;  Laterality: N/A;  . HERNIA REPAIR     umbilical  . HIATAL HERNIA REPAIR    . I&D EXTREMITY Left 06/27/2015   Procedure: IRRIGATION AND DEBRIDEMENT EXTREMITY            ( CALF HEMATOMA ) POSSIBLE WOUND VAC;  Surgeon: [name], MD;  Location: [facility] ORS;  Service: Vascular;  Laterality: Left;  . INCISION AND DRAINAGE OF WOUND Left 09/25/2016   Procedure: IRRIGATION AND DEBRIDEMENT - PARTIAL RESECTION OF ACHILLES TENDON WITH WOUND VAC APPLICATION;  Surgeon: [name], DPM;  Location: [facility] ORS;  Service: Podiatry;  Laterality: Left;  . IRRIGATION AND DEBRIDEMENT ABSCESS Left 09/07/2016   Procedure: IRRIGATION AND DEBRIDEMENT ABSCESS LEFT HEEL;  Surgeon: [name], DPM;  Location: [facility] ORS;  Service: Podiatry;  Laterality: Left;  . JOINT REPLACEMENT  2013   left knee replacement  . KIDNEY SURGERY Right    kidney stones  . LITHOTRIPSY    . NM [facility] HX)  February 2017   Likely breast attenuation. LOW RISK study. Normal EF 55-60%.  . OTHER SURGICAL HISTORY     08/2016 or 09/2016 surgery on achilles tenso h/o staph infection heal. Dr. [name]  . removal of left hematoma Left    leg  . REVERSE SHOULDER ARTHROPLASTY Right 10/08/2015   Procedure: REVERSE SHOULDER ARTHROPLASTY;  Surgeon: [name], MD;  Location: [facility] ORS;  Service:  Orthopedics;  Laterality: Right;  . SLEEVE GASTROPLASTY    . TONSILLECTOMY    . TOTAL KNEE ARTHROPLASTY    . TOTAL SHOULDER REPLACEMENT Right   . TRANSESOPHAGEAL ECHOCARDIOGRAM  08/08/2013   Mild LVH, EF 60-65%. Moderate LA dilation and mild RA dilation. Mild MR with no evidence of stenosis and no evidence of endocarditis. A false chordae is noted.  . TRANSTHORACIC ECHOCARDIOGRAM  08/03/2013   Mild-Moderate concentric LVH, EF 60-65%. Normal diastolic function. Mild LA dilation. Mild MR with possible vegetation  - not confirmed on TEE   . ULNAR NERVE TRANSPOSITION Right 08/06/2016   Procedure: ULNAR NERVE DECOMPRESSION/TRANSPOSITION;  Surgeon: [name], MD;  Location: [facility] ORS;  Service: Orthopedics;  Laterality: Right;  . UPPER GI ENDOSCOPY    . VAGINAL HYSTERECTOMY  Home Medications:  Allergies as of 12/13/2017      Reactions   Penicillins Hives, Shortness Of Breath, Swelling   Facial swelling Has patient had a PCN reaction causing immediate rash, facial/tongue/throat swelling, SOB or lightheadedness with hypotension: Yes Has patient had a PCN reaction causing severe rash involving mucus membranes or skin necrosis: Yes Has patient had a PCN reaction that required hospitalization No Has patient had a PCN reaction occurring within the last 10 years: No If all of the above answers are "NO", then may proceed with Cephalosporin use.   Aspirin Hives   Morphine And Related Other (See Comments)   Patient becomes very confused   Terramycin [oxytetracycline] Hives      Medication List        Accurate as of 12/13/17 11:59 PM. Always use your most recent med list.          albuterol (2.5 MG/3ML) 0.083% nebulizer solution Commonly known as:  PROVENTIL Take 3 mLs (2.5 mg total) by nebulization every 6 (six) hours as needed for wheezing or shortness of breath.   albuterol 108 (90 Base) MCG/ACT inhaler Commonly known as:  PROVENTIL HFA;VENTOLIN HFA Inhale 1-2 puffs into the lungs  every 4 (four) hours as needed for wheezing or shortness of breath.   ALPRAZolam 0.5 MG tablet Commonly known as:  XANAX Take 1 tablet (0.5 mg total) by mouth 2 (two) times daily as needed for anxiety.   amLODipine 5 MG tablet Commonly known as:  NORVASC Take 1 tablet (5 mg total) by mouth daily.   benzonatate 200 MG capsule Commonly known as:  TESSALON Take 1 capsule (200 mg total) by mouth 3 (three) times daily as needed for cough.   buPROPion 300 MG 24 hr tablet Commonly known as:  WELLBUTRIN XL Take 1 tablet (300 mg total) by mouth daily.   clobetasol cream 0.05 % Commonly known as:  TEMOVATE Apply 1 application topically 2 (two) times daily. To arms   cyclobenzaprine 10 MG tablet Commonly known as:  FLEXERIL Take 10 mg by mouth as needed.   dabigatran 150 MG Caps capsule Commonly known as:  PRADAXA Take 1 capsule (150 mg total) by mouth 2 (two) times daily.   dexlansoprazole 60 MG capsule Commonly known as:  DEXILANT Take 1 capsule (60 mg total) by mouth daily. Minutes before meal (lunch)   DULoxetine 60 MG capsule Commonly known as:  CYMBALTA Take 1 capsule (60 mg total) by mouth daily.   estradiol 0.1 MG/GM vaginal cream Commonly known as:  ESTRACE Place 1 Applicatorful vaginally 3 (three) times a week.   ferrous sulfate 325 (65 FE) MG tablet Take 325 mg by mouth daily with breakfast.   Fluticasone-Salmeterol 250-50 MCG/DOSE Aepb Commonly known as:  ADVAIR Inhale 1 puff into the lungs 2 (two) times daily.   gabapentin 800 MG tablet Commonly known as:  NEURONTIN Take 1 tablet (800 mg total) by mouth 4 (four) times daily.   hydrochlorothiazide 12.5 MG tablet Commonly known as:  HYDRODIURIL Take 1 tablet (12.5 mg total) by mouth daily. In am   HYDROcodone-acetaminophen 5-325 MG tablet Commonly known as:  NORCO/VICODIN Take 1 tablet by mouth every 8 (eight) hours as needed for severe pain.   HYDROcodone-acetaminophen 5-325 MG tablet Commonly known as:   NORCO/VICODIN Take 1 tablet by mouth every 8 (eight) hours as needed for severe pain. Start taking on:  01/08/2018   HYDROcodone-acetaminophen 5-325 MG tablet Commonly known as:  NORCO/VICODIN Take 1 tablet by mouth every  8 (eight) hours as needed for severe pain. Start taking on:  02/07/2018   levocetirizine 5 MG tablet Commonly known as:  XYZAL TAKE 1 TABLET BY MOUTH EVERY EVENING   levothyroxine 175 MCG tablet Commonly known as:  SYNTHROID, LEVOTHROID Take 1 tablet (175 mcg total) by mouth daily before breakfast.   metFORMIN 1000 MG tablet Commonly known as:  GLUCOPHAGE TAKE ONE TABLET BY MOUTH TWICE DAILY   metoprolol succinate 100 MG 24 hr tablet Commonly known as:  TOPROL-XL Take 1 tablet (100 mg total) by mouth daily. Take with or immediately following a meal.   mirabegron ER 50 MG Tb24 tablet Commonly known as:  MYRBETRIQ Take 1 tablet (50 mg total) by mouth daily.   montelukast 10 MG tablet Commonly known as:  SINGULAIR TAKE ONE TABLET BY MOUTH EVERY DAY   multivitamin with minerals Tabs tablet Take 1 tablet by mouth daily.   mupirocin ointment 2 % Commonly known as:  BACTROBAN Apply 1 application topically 2 (two) times daily. Skin wounds   nystatin cream Commonly known as:  MYCOSTATIN Apply 1 application topically 2 (two) times daily. Mouth prn for 7 days as needed   ondansetron 4 MG tablet Commonly known as:  ZOFRAN Take 1 tablet (4 mg total) every 8 (eight) hours as needed by mouth for nausea or vomiting.   oxybutynin 15 MG 24 hr tablet Commonly known as:  DITROPAN XL TAKE ONE TABLET BY MOUTH AT BEDTIME   quinapril 40 MG tablet Commonly known as:  ACCUPRIL Take 1 tablet (40 mg total) by mouth daily.   triamcinolone cream 0.1 % Commonly known as:  KENALOG Apply 1 application 2 (two) times daily as needed topically.   vitamin B-12 500 MCG tablet Commonly known as:  CYANOCOBALAMIN Take 500 mcg by mouth daily.   VOLTAREN 1 % Gel Generic drug:   diclofenac sodium Apply 1 application topically as needed.   zinc oxide 20 % ointment Apply 1 application topically as needed for irritation.       Allergies:  Allergies  Allergen Reactions  . Penicillins Hives, Shortness Of Breath and Swelling    Facial swelling Has patient had a PCN reaction causing immediate rash, facial/tongue/throat swelling, SOB or lightheadedness with hypotension: Yes Has patient had a PCN reaction causing severe rash involving mucus membranes or skin necrosis: Yes Has patient had a PCN reaction that required hospitalization No Has patient had a PCN reaction occurring within the last 10 years: No If all of the above answers are "NO", then may proceed with Cephalosporin use.   . Aspirin Hives  . Morphine And Related Other (See Comments)    Patient becomes very confused  . Terramycin [Oxytetracycline] Hives    Family History: Family History  Problem Relation Age of Onset  . Skin cancer Father   . Diabetes Father   . Hypertension Father   . Peripheral vascular disease Father   . Cancer Father   . Cerebral aneurysm Father   . Alcohol abuse Father   . Varicose Veins Mother   . Kidney disease Mother   . Arthritis Mother   . Mental illness Sister   . Cancer Maternal Aunt        breast  . Breast cancer Maternal Aunt   . Arthritis Maternal Grandmother   . Hypertension Maternal Grandmother   . Diabetes Maternal Grandmother   . Arthritis Maternal Grandfather   . Heart disease Maternal Grandfather   . Hypertension Maternal Grandfather   . Arthritis Paternal  Grandmother   . Hypertension Paternal Grandmother   . Diabetes Paternal Grandmother   . Arthritis Paternal Grandfather   . Hypertension Paternal Grandfather   . Bladder Cancer Neg Hx   . Kidney cancer Neg Hx     Social History:  reports that she has never smoked. She has never used smokeless tobacco. She reports that she does not drink alcohol or use drugs.  ROS: UROLOGY Frequent  Urination?: Yes Hard to postpone urination?: Yes Burning/pain with urination?: Yes Get up at night to urinate?: No Leakage of urine?: Yes Urine stream starts and stops?: No Trouble starting stream?: No Do you have to strain to urinate?: Yes Blood in urine?: Yes Urinary tract infection?: No Sexually transmitted disease?: No Injury to kidneys or bladder?: No Painful intercourse?: No Weak stream?: No Currently pregnant?: No Vaginal bleeding?: Yes Last menstrual period?: n  Gastrointestinal Nausea?: Yes Vomiting?: No Indigestion/heartburn?: Yes Diarrhea?: No Constipation?: No  Constitutional Fever: No Night sweats?: No Weight loss?: No Fatigue?: Yes  Skin Skin rash/lesions?: No Itching?: No  Eyes Blurred vision?: No Double vision?: No  Ears/Nose/Throat Sore throat?: No Sinus problems?: Yes  Hematologic/Lymphatic Swollen glands?: No Easy bruising?: No  Cardiovascular Leg swelling?: Yes Chest pain?: No  Respiratory Cough?: Yes Shortness of breath?: No  Endocrine Excessive thirst?: Yes  Musculoskeletal Back pain?: No Joint pain?: Yes  Neurological Headaches?: No Dizziness?: No  Psychologic Depression?: No Anxiety?: Yes  Physical Exam: BP 135/81 (BP Location: Left Arm, Patient Position: Sitting, Cuff Size: Normal)   Pulse (!) 41   Ht 5\' 5"  (1.651 m)   Wt 259 lb 1.6 oz (117.5 kg)   BMI 43.12 kg/m   Constitutional: Well nourished. Alert and oriented, No acute distress. HEENT: [place] AT, moist mucus membranes. Trachea midline, no masses. Cardiovascular: No clubbing, cyanosis, or edema. Respiratory: Normal respiratory effort, no increased work of breathing. Skin: No rashes, bruises or suspicious lesions. Lymph: No cervical or inguinal adenopathy. Neurologic: Grossly intact, no focal deficits, moving all 4 extremities. Psychiatric: Normal mood and affect.  Pertinent imaging Results for Dawn, [name] (MRN [id number]) as of 12/21/2017 13:20  Ref.  Range 12/13/2017 14:50  Scan Result Unknown 41    Assessment & Plan:    1. Recurrent UTI's Currently symptomatic - UA with 11-30 WBC's  Will send for culture as patient has been taking doxycycline and the nocturia may demonstrate resistance Follow up pending urine culture  2. Left kidney stone No intervention warranted at this time Will continue to monitor KUB in one year                         3. Mixed urinary incontinence BLADDER SCAN AMB NON-IMAGING Continue Myrbetriq  25 mg daily and oxybutynin XL 15 mg daily Patient could not tolerate the pessary - she does not want surgery She deferred PT   4. Atrophic vaginitis  - continue vaginal estrogen cream three nights weekly  5. Gross hematuria May be due to infection Continue to monitor to ensure it does not persist    Zara Council, [facility]  [address] [place], [place] [address] [phone]

## 2017-12-14 ENCOUNTER — Other Ambulatory Visit: Payer: Self-pay | Admitting: Urology

## 2017-12-14 LAB — MICROSCOPIC EXAMINATION: RBC MICROSCOPIC, UA: NONE SEEN /HPF (ref 0–2)

## 2017-12-14 LAB — URINALYSIS, COMPLETE
BILIRUBIN UA: NEGATIVE
GLUCOSE, UA: NEGATIVE
Leukocytes, UA: NEGATIVE
Nitrite, UA: NEGATIVE
PROTEIN UA: NEGATIVE
RBC UA: NEGATIVE
UUROB: 0.2 mg/dL (ref 0.2–1.0)
pH, UA: 5.5 (ref 5.0–7.5)

## 2017-12-15 LAB — CULTURE, URINE COMPREHENSIVE

## 2017-12-21 ENCOUNTER — Telehealth: Payer: Self-pay | Admitting: Urology

## 2017-12-21 ENCOUNTER — Encounter: Payer: Self-pay | Admitting: Urology

## 2017-12-21 ENCOUNTER — Other Ambulatory Visit: Payer: Self-pay | Admitting: Family Medicine

## 2017-12-21 DIAGNOSIS — L97521 Non-pressure chronic ulcer of other part of left foot limited to breakdown of skin: Secondary | ICD-10-CM | POA: Diagnosis not present

## 2017-12-21 DIAGNOSIS — E1143 Type 2 diabetes mellitus with diabetic autonomic (poly)neuropathy: Secondary | ICD-10-CM | POA: Diagnosis not present

## 2017-12-21 DIAGNOSIS — N390 Urinary tract infection, site not specified: Secondary | ICD-10-CM

## 2017-12-21 NOTE — Telephone Encounter (Signed)
Patient notified

## 2017-12-21 NOTE — Telephone Encounter (Signed)
Please ask [name] to come by sometime next week so that she may drop off an urine for UA and culture.  Her urine culture was negative, but this may be due to the fact that she had taken doxycyline.  I want to make sure that if an infection was present it does not return and that the blood in the urine does not return.

## 2017-12-23 ENCOUNTER — Other Ambulatory Visit: Payer: Self-pay

## 2017-12-24 ENCOUNTER — Other Ambulatory Visit: Payer: Self-pay

## 2017-12-25 IMAGING — DX DG SHOULDER 2+V PORT*R*
2 series · 2 of 2 positions shown · non-contrast
Comparison: Shoulder MRI 07/26/2015

CLINICAL DATA: Postoperative evaluation of the right shoulder.

EXAM:
PORTABLE RIGHT SHOULDER - 2+ VIEW

[shoulder ap (1 of 2)]
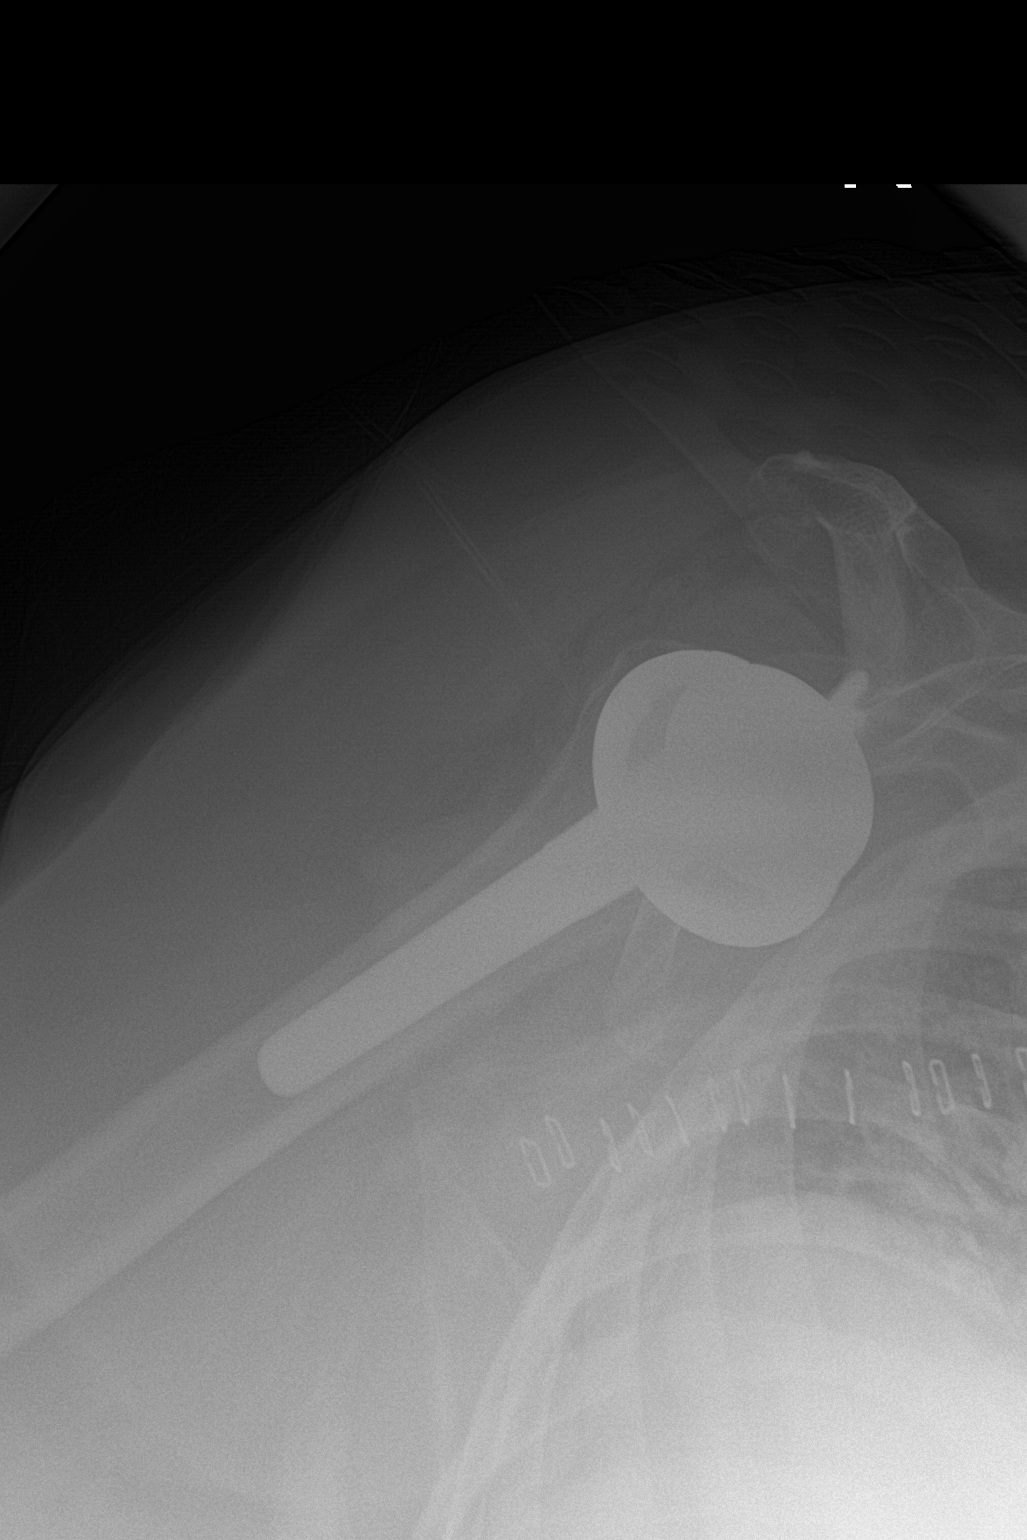

[shoulder ap (2 of 2)]
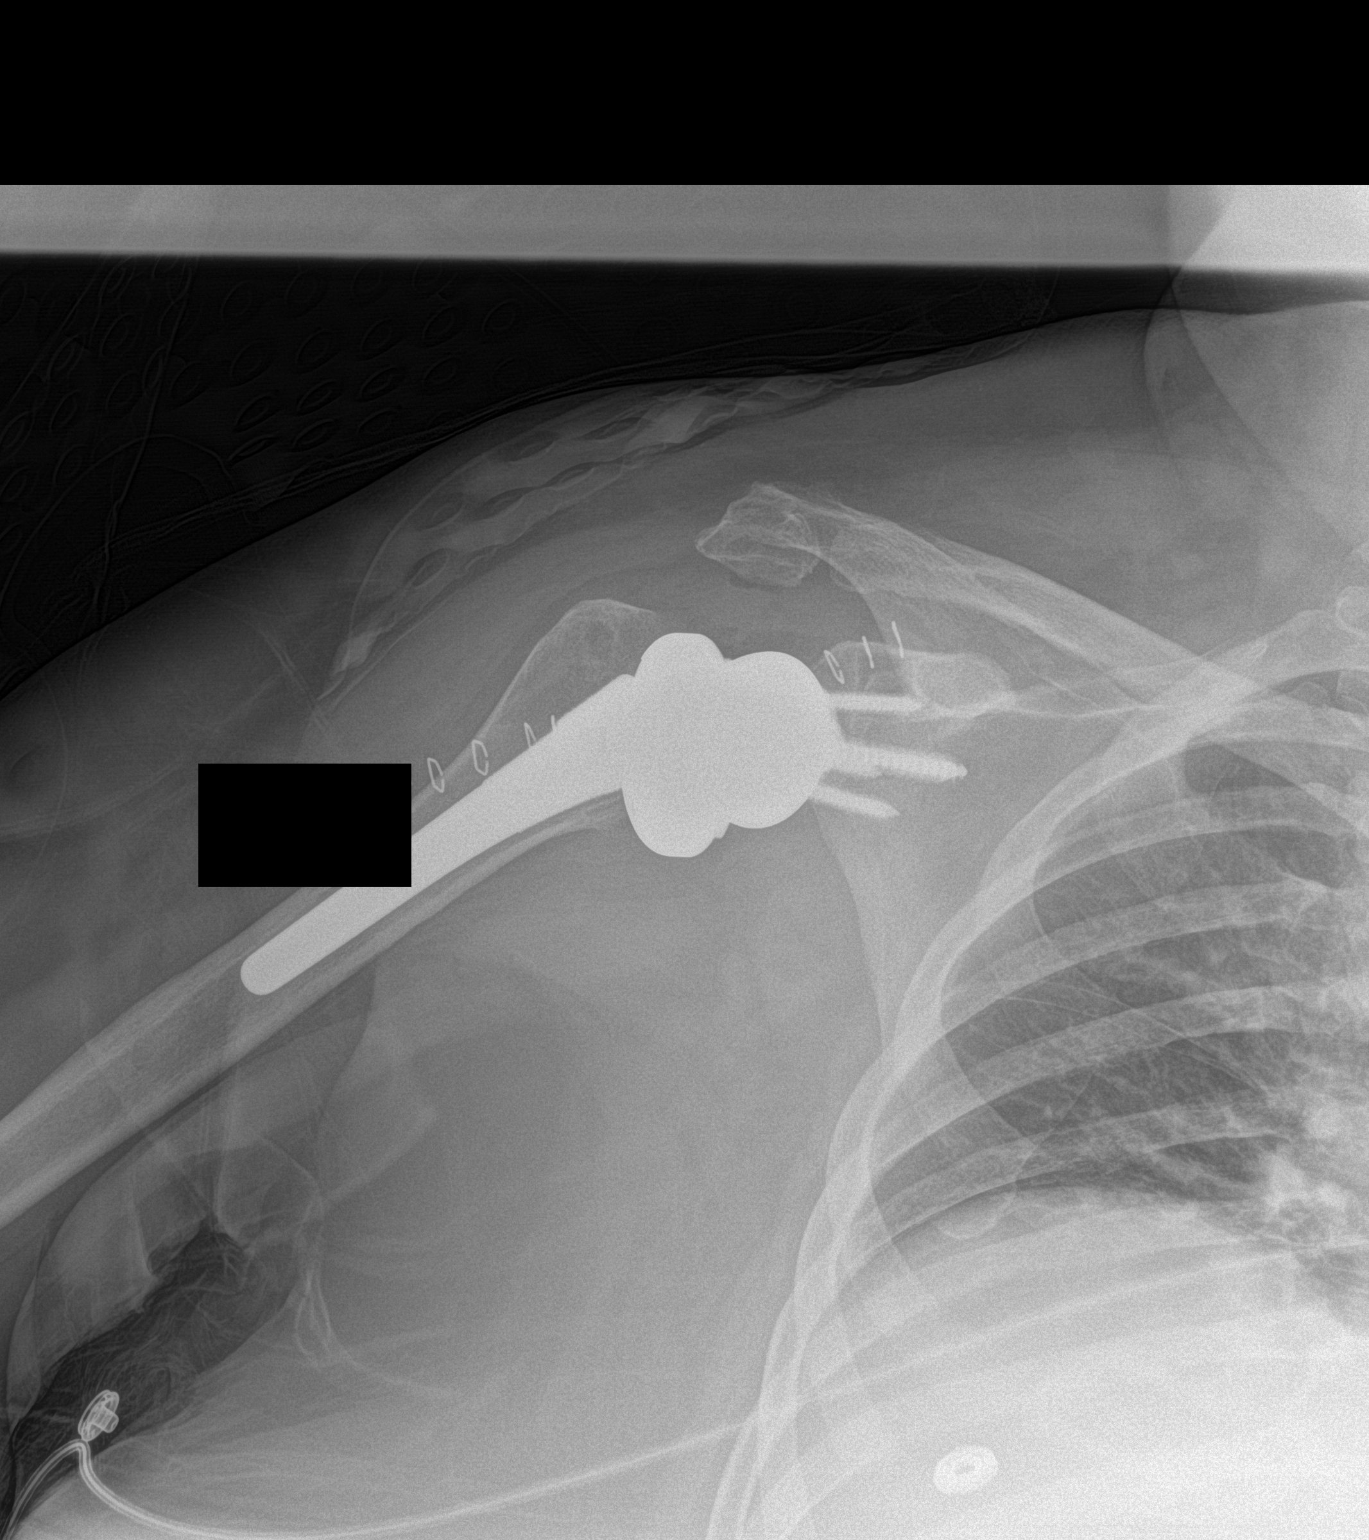

[2 of 2 positions shown; findings below may reference images not displayed]

FINDINGS: Two views of the right shoulder demonstrate postoperative changes
compatible with right shoulder arthroplasty. Hardware appears
intact. Overlying surgical staple line. Visualized right hemi thorax
is unremarkable.
IMPRESSION: Patient status post right shoulder arthroplasty.

## 2017-12-26 IMAGING — DX DG CHEST 1V PORT
1 series · 1 of 1 positions shown · non-contrast
Comparison: 12/17/2014

CLINICAL DATA: COPD

EXAM:
PORTABLE CHEST 1 VIEW

[chest ap]
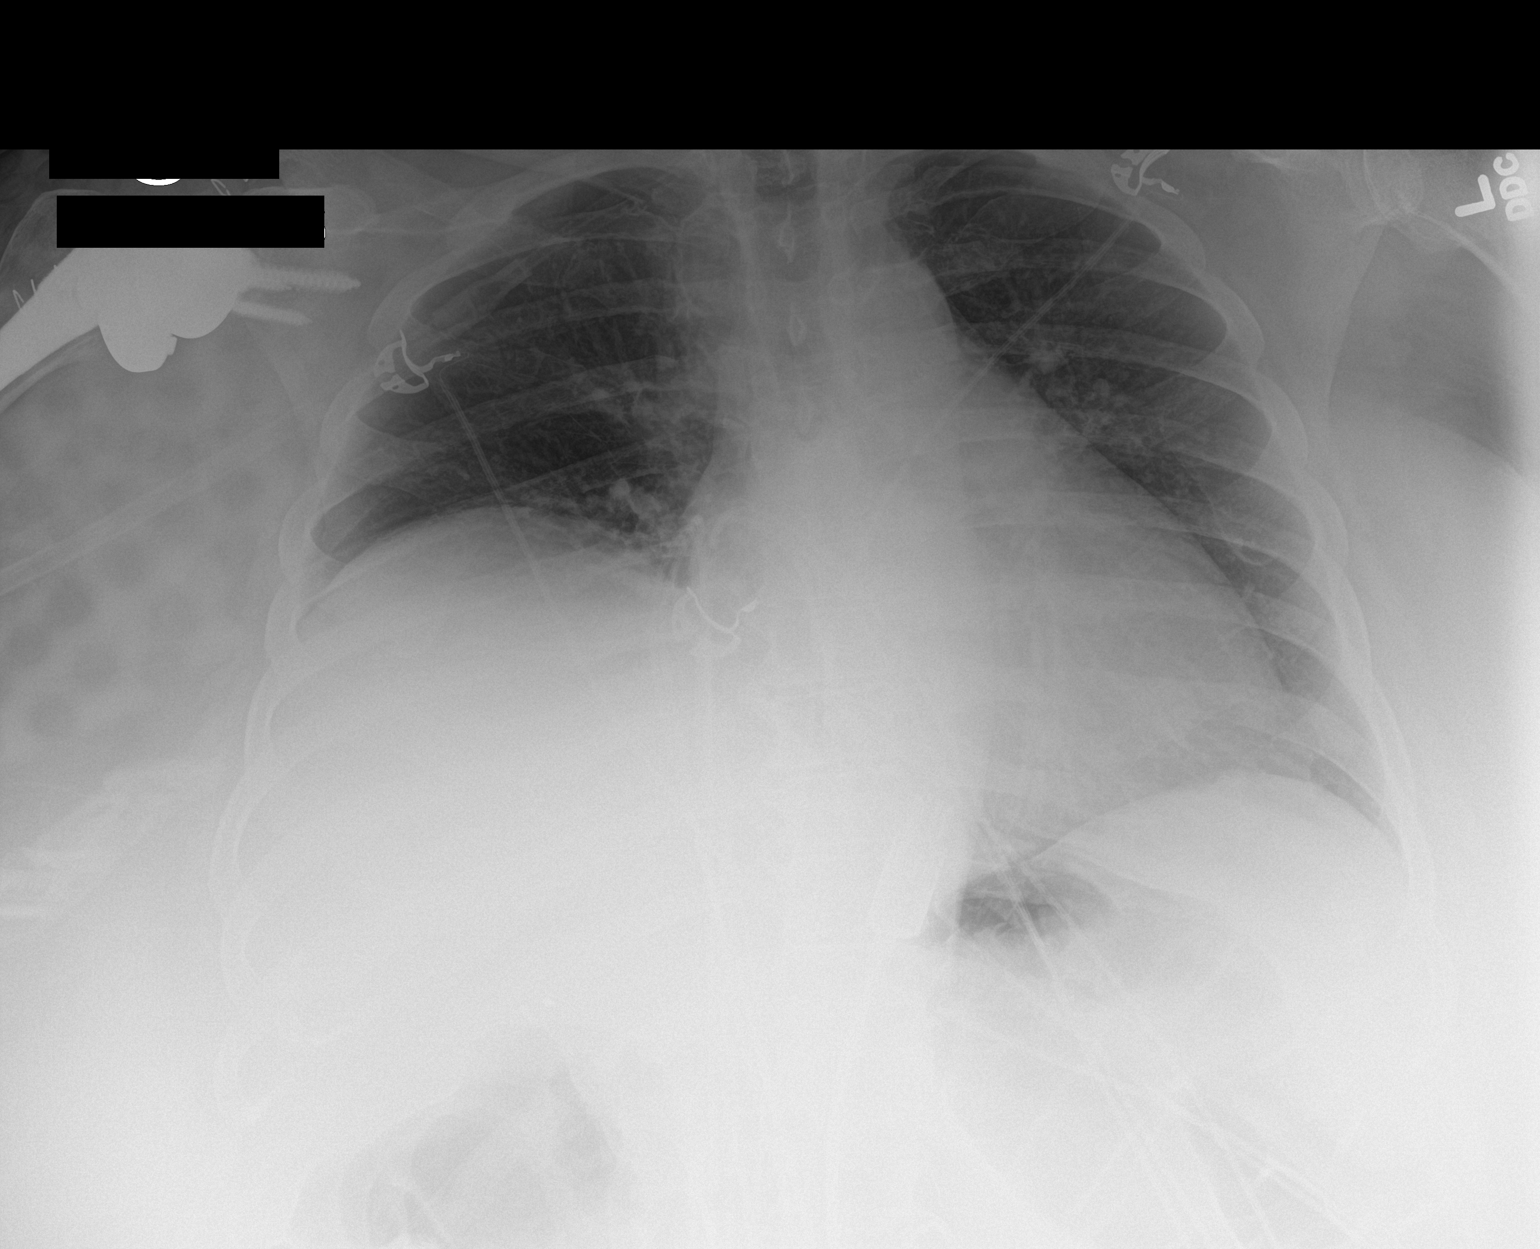

[1 of 1 positions shown; findings below may reference images not displayed]

FINDINGS: 9993 hours. Marked asymmetric elevation right hemidiaphragm. The
cardio pericardial silhouette is enlarged. Vascular congestion
without overt airspace pulmonary edema. There is some atelectasis in
the right lower lung. Patient is status post right shoulder
replacement. Telemetry leads overlie the chest.
IMPRESSION: Marked asymmetric elevation right hemidiaphragm.

Cardiomegaly.

## 2017-12-26 IMAGING — CT CT ANGIO CHEST
2 of 7 series · 16 of 46 positions shown · IV contrast (APPLIED)
Comparison: None.

CLINICAL DATA: Recent right shoulder surgery.  Shortness of breath.

EXAM:
CT ANGIOGRAPHY CHEST WITH CONTRAST
TECHNIQUE: Multidetector CT imaging of the chest was performed using the
standard protocol during bolus administration of intravenous
contrast. Multiplanar CT image reconstructions and MIPs were
obtained to evaluate the vascular anatomy.
CONTRAST:  100 mL Isovue 370

[Series 6: pe thins 1.5 · axial · 0.68mm/px · z∈[-562,-333]mm · 13 of 223 slices shown]
[im 16/223  lung]
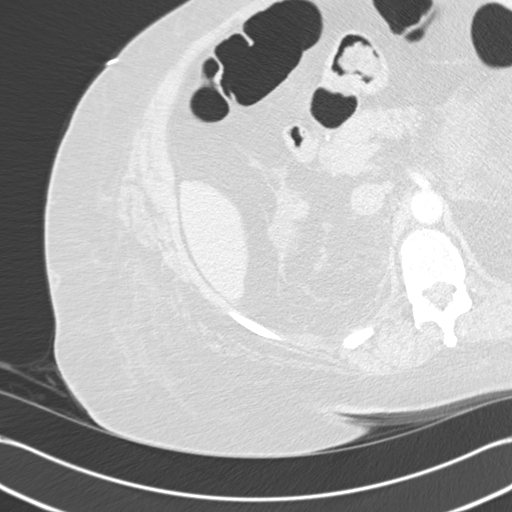
[im 32/223  soft-tissue]
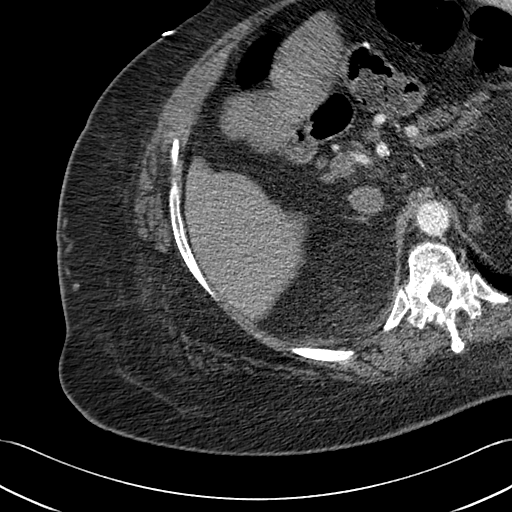
[im 48/223  lung]
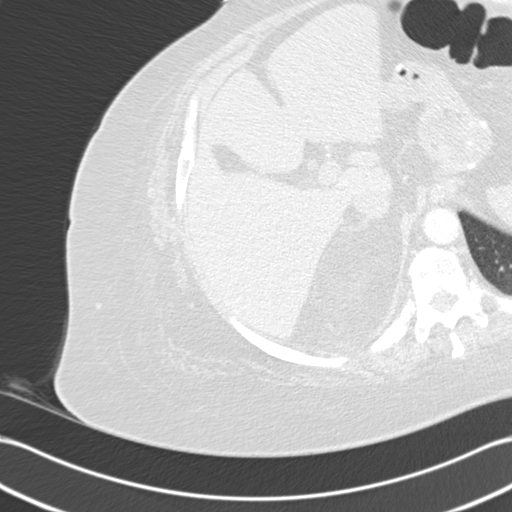
[im 64/223  soft-tissue]
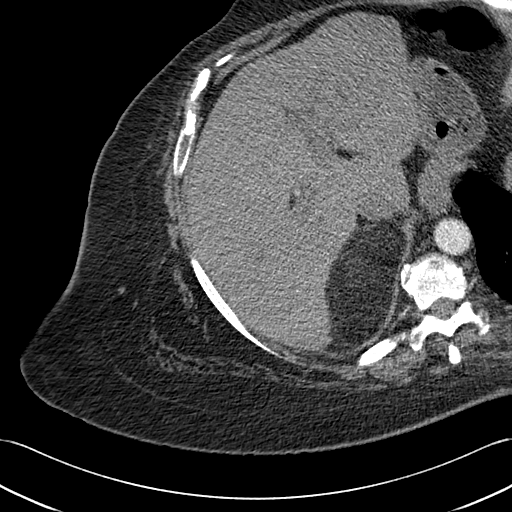
[im 80/223  lung]
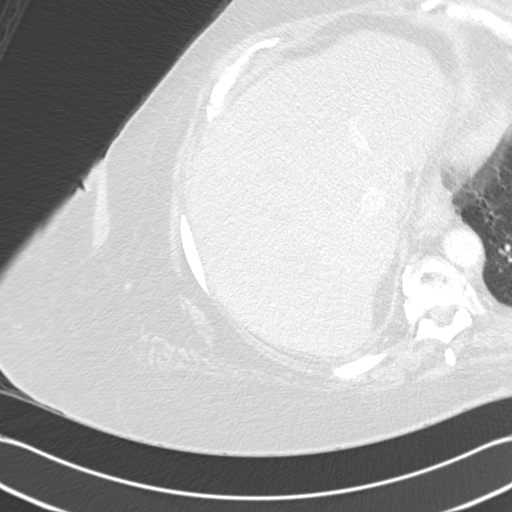
[im 96/223  soft-tissue]
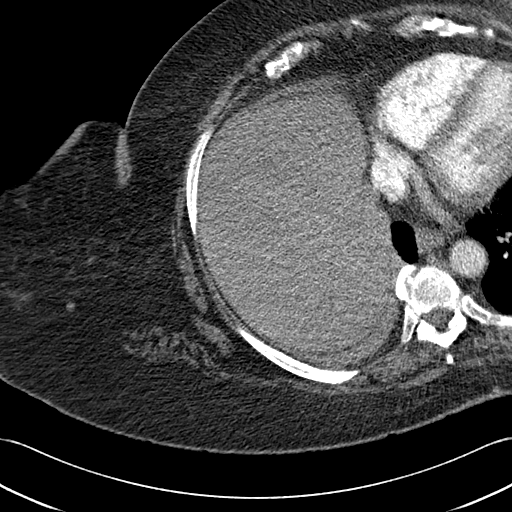
[im 112/223  lung]
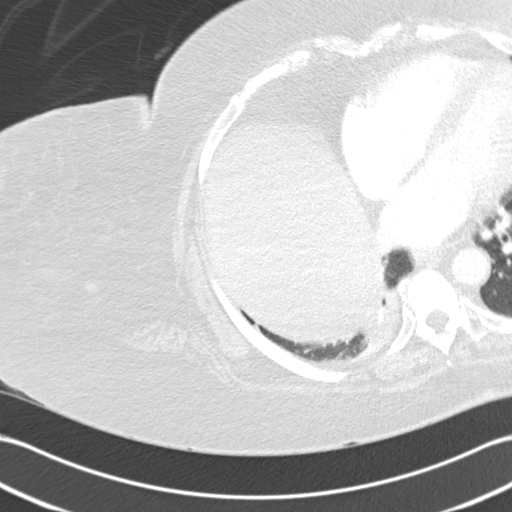
[im 127/223  soft-tissue]
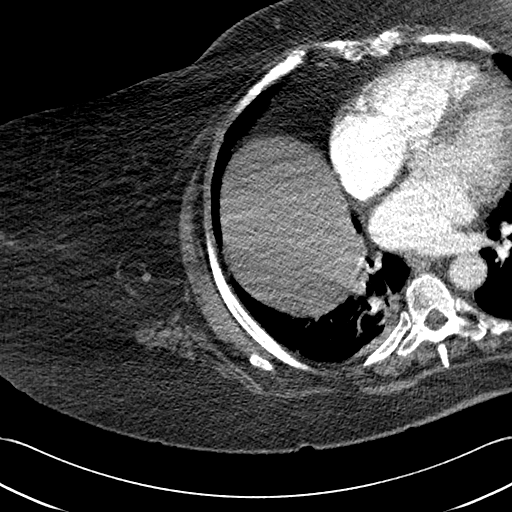
[im 143/223  lung]
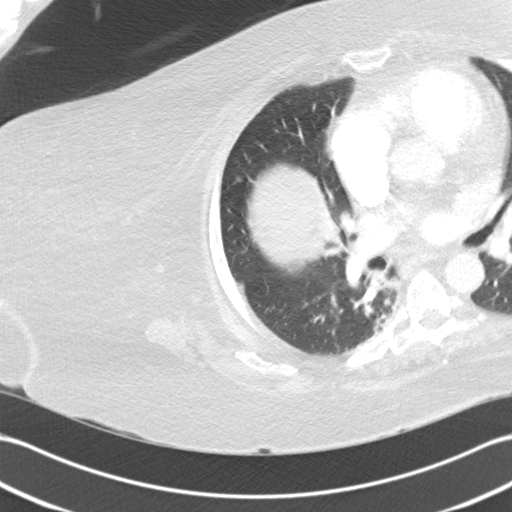
[im 159/223  soft-tissue]
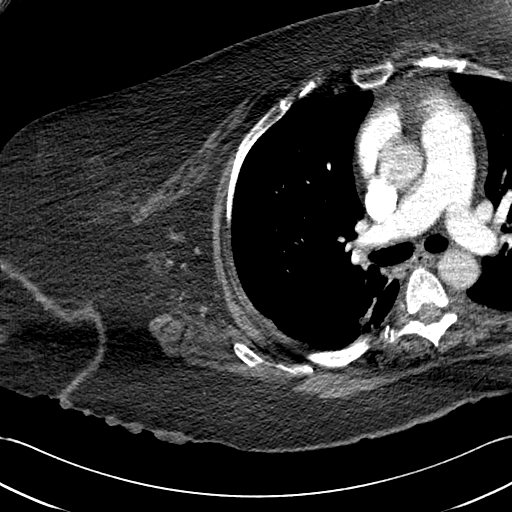
[im 175/223  lung]
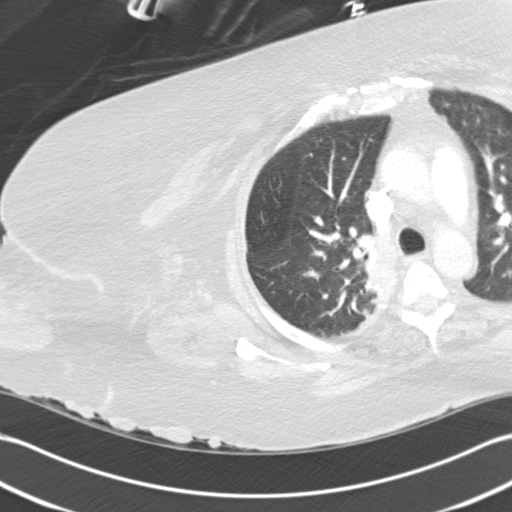
[im 191/223  soft-tissue]
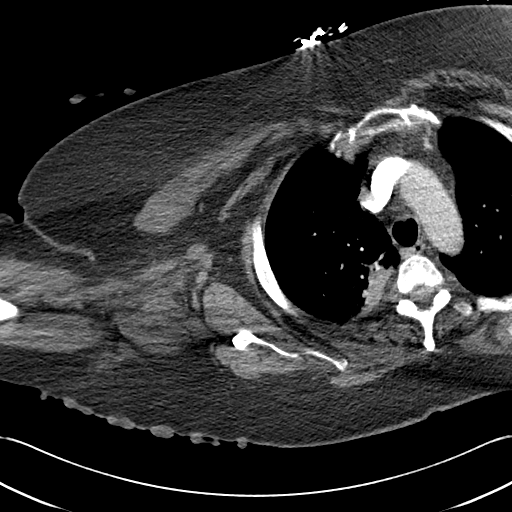
[im 207/223  lung]
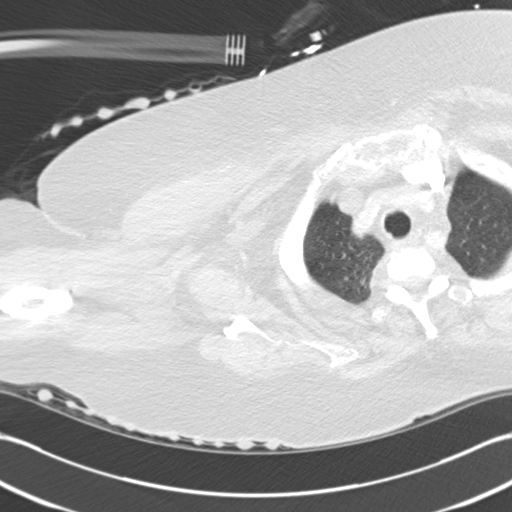

[Series 9: cor mpr 2.0 · coronal · 0.53mm/px · 3 of 145 slices shown]
[im 37/145  soft-tissue]
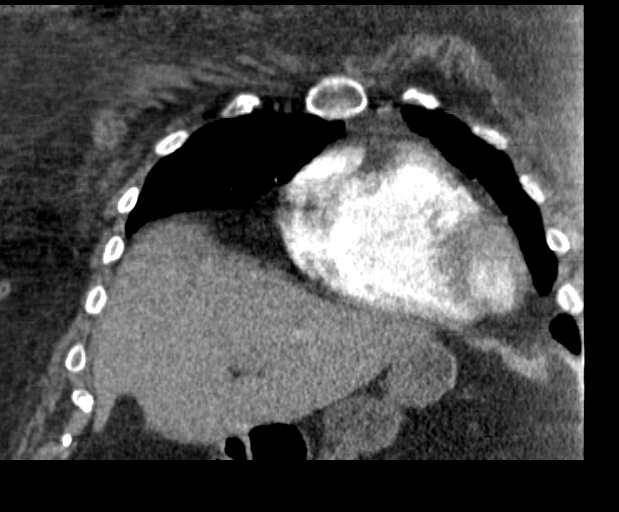
[im 73/145  soft-tissue]
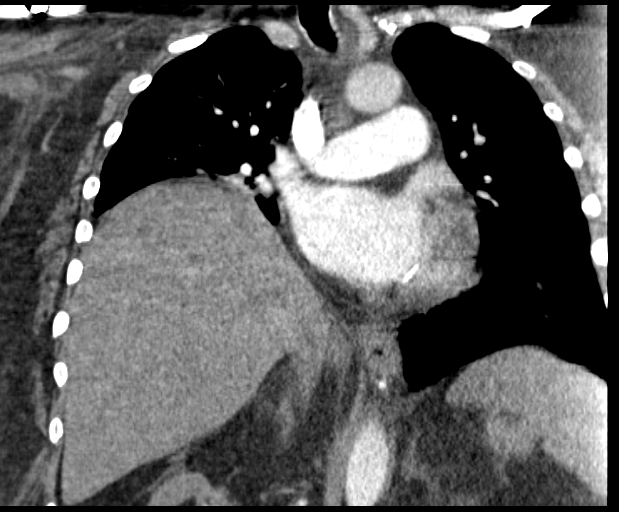
[im 109/145  soft-tissue]
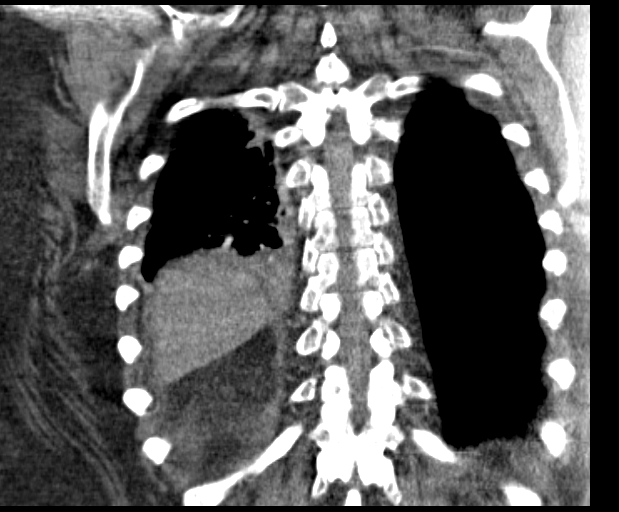

[16 of 46 positions shown; findings below may reference images not displayed]

FINDINGS: Mediastinum/Lymph Nodes: No pulmonary emboli or thoracic aortic
dissection identified. Normal heart size. No pericardial effusion.
No masses or pathologically enlarged lymph nodes identified.

Lungs/Pleura: No pleural effusion or pneumothorax. Left lower lobe
airspace disease. Airspace disease in the posterior segment of the
right upper lobe with small area of hyperdense material within the
airspace disease. There is a small area of reticular nodular
airspace disease in the left upper lobe which may be secondary to an
infectious or inflammatory etiology. Elevation of the right
diaphragm.

Upper abdomen: Large bilateral fat containing adrenal masses most
compatible with myelolipomas which are incompletely visualized.
There is evidence of prior gastric bypass. There is a small hiatal
hernia.

Musculoskeletal: No chest wall mass or suspicious bone lesions
identified. There is mild lower thoracic spine spondylosis. There is
a chronic T12 vertebral body compression fracture. Total reverse
right shoulder arthroplasty.

Review of the MIP images confirms the above findings.
IMPRESSION: 1. No evidence pulmonary embolus.
2. Airspace disease in the posterior segment of the right upper lobe
and right lower lobe with a small area of hyperdense material in the
right upper lobe airspace disease. Differential considerations
include atelectasis versus pneumonia including aspiration pneumonia.
3. Reticular nodular airspace disease in the left upper lobe which
may be secondary to an infectious or inflammatory etiology.

## 2017-12-27 ENCOUNTER — Other Ambulatory Visit: Payer: PPO

## 2017-12-27 DIAGNOSIS — N390 Urinary tract infection, site not specified: Secondary | ICD-10-CM | POA: Diagnosis not present

## 2017-12-27 LAB — MICROSCOPIC EXAMINATION

## 2017-12-27 LAB — URINALYSIS, COMPLETE
Bilirubin, UA: NEGATIVE
GLUCOSE, UA: NEGATIVE
KETONES UA: NEGATIVE
LEUKOCYTES UA: NEGATIVE
NITRITE UA: NEGATIVE
Protein, UA: NEGATIVE
Specific Gravity, UA: >1.03 — ABNORMAL HIGH (ref 1.005–1.030)
UUROB: 0.2 mg/dL (ref 0.2–1.0)
pH, UA: 5 (ref 5.0–7.5)

## 2017-12-28 ENCOUNTER — Other Ambulatory Visit: Payer: Self-pay | Admitting: Internal Medicine

## 2017-12-28 ENCOUNTER — Other Ambulatory Visit: Payer: Self-pay | Admitting: Urology

## 2017-12-28 DIAGNOSIS — G4733 Obstructive sleep apnea (adult) (pediatric): Secondary | ICD-10-CM | POA: Diagnosis not present

## 2017-12-28 DIAGNOSIS — F32A Depression, unspecified: Secondary | ICD-10-CM

## 2017-12-28 DIAGNOSIS — F419 Anxiety disorder, unspecified: Secondary | ICD-10-CM

## 2017-12-28 DIAGNOSIS — F329 Major depressive disorder, single episode, unspecified: Secondary | ICD-10-CM

## 2017-12-28 MED ORDER — ALPRAZOLAM 0.5 MG PO TABS: 0.5000 mg | ORAL_TABLET | Freq: Two times a day (BID) | ORAL | 2 refills | Status: DC | PRN

## 2017-12-29 IMAGING — CR DG CHEST 2V
2 series · 3 of 3 positions shown · non-contrast
Comparison: October 09, 2015

CLINICAL DATA: Shortness of breath

EXAM:
CHEST  2 VIEW

[Series 2: chest lat · 0.14mm/px · 2 of 2 slices shown]
[im 1/2]
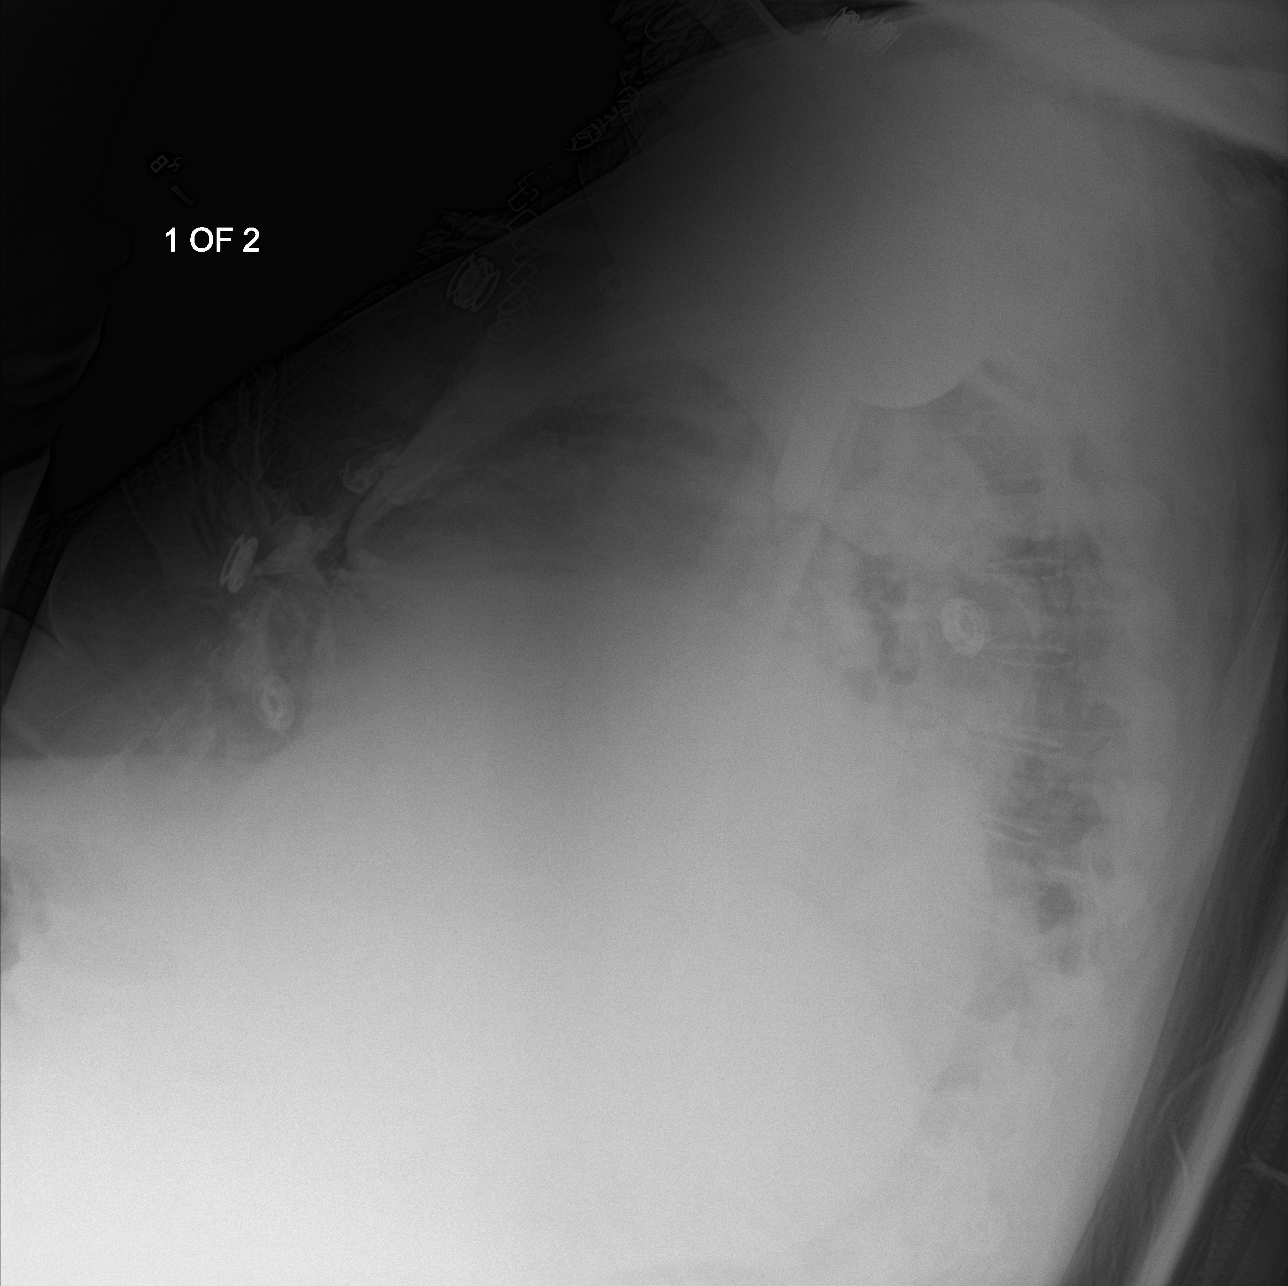
[im 2/2]
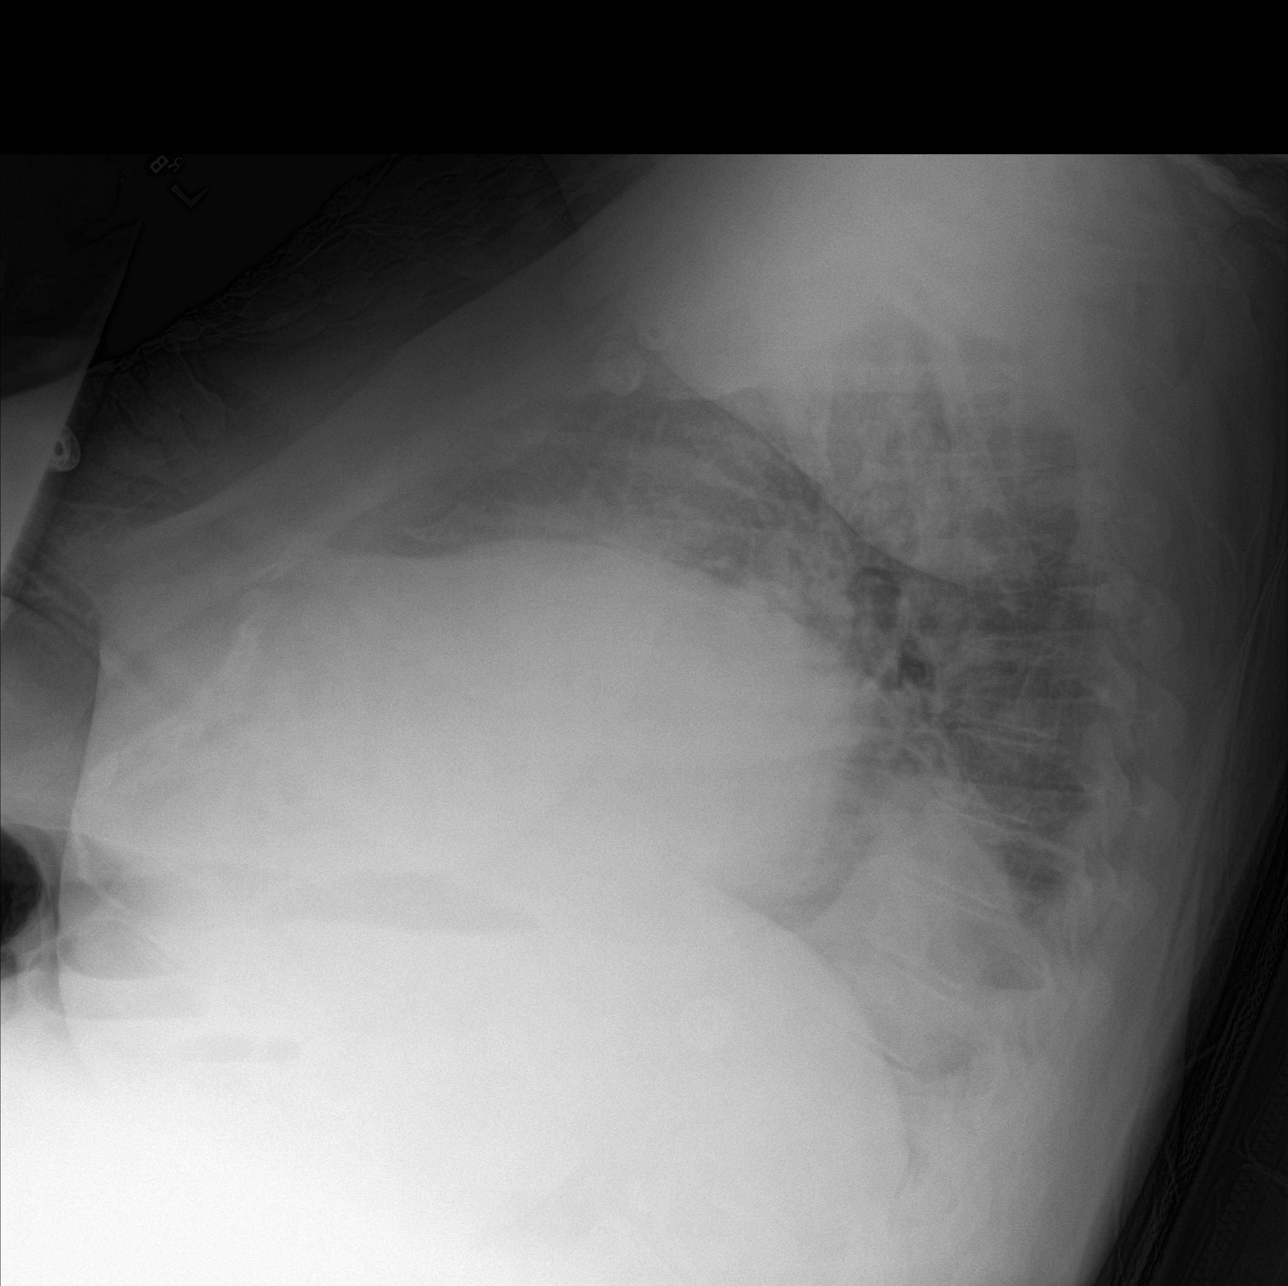

[chest ap]
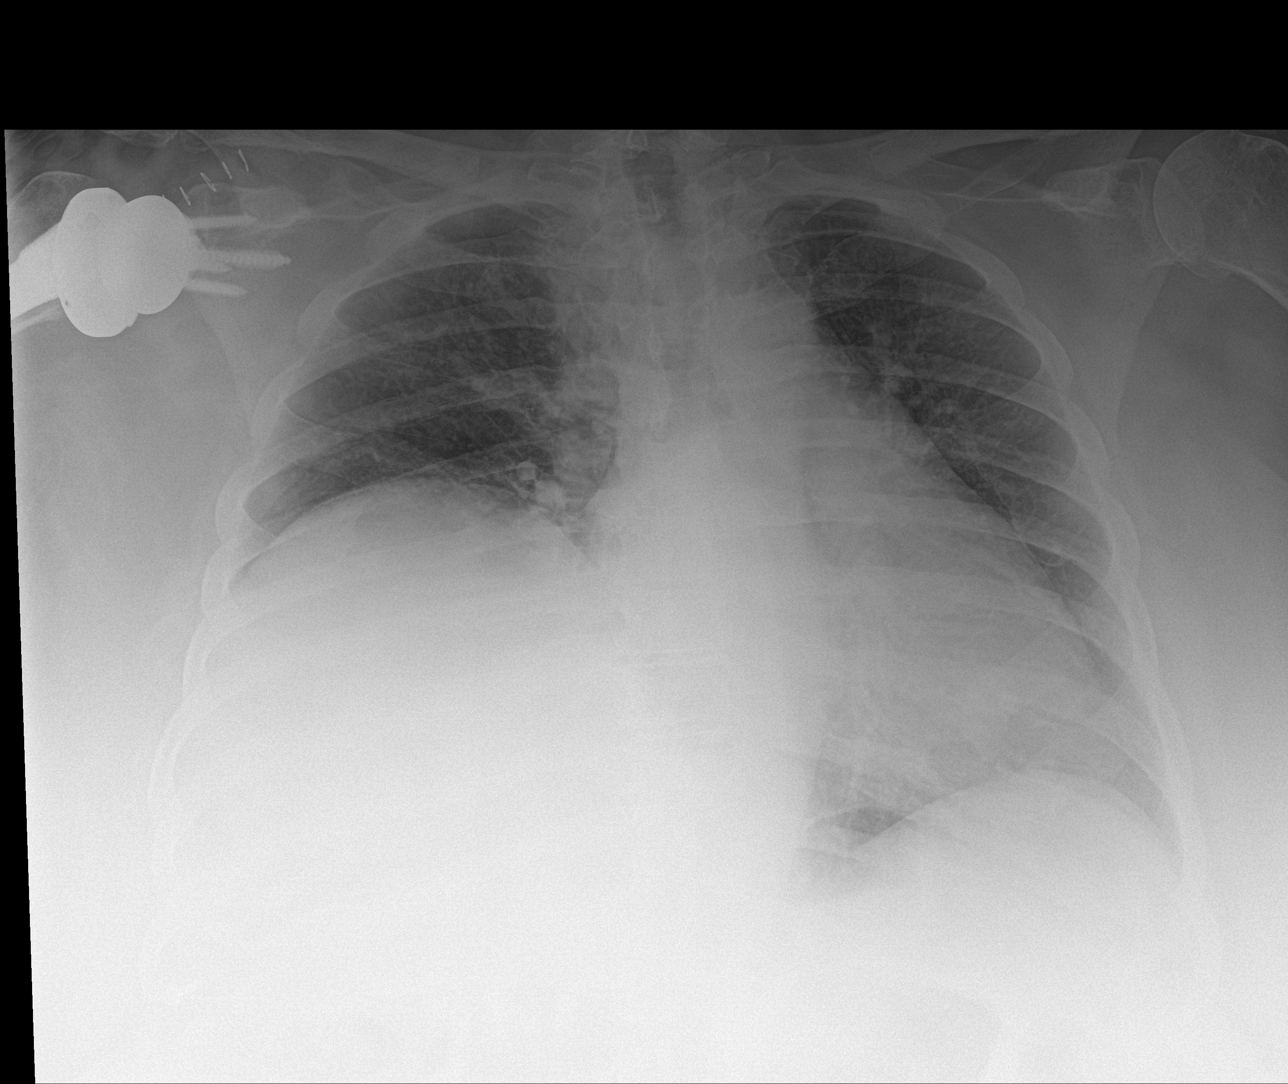

[3 of 3 positions shown; findings below may reference images not displayed]

FINDINGS: There is elevation of the right hemidiaphragm. Cardiomegaly remains.
The hila and mediastinum are unchanged. No pulmonary nodules or
masses. No focal infiltrate or overt edema.
IMPRESSION: No active cardiopulmonary disease.

## 2017-12-30 DIAGNOSIS — L97521 Non-pressure chronic ulcer of other part of left foot limited to breakdown of skin: Secondary | ICD-10-CM | POA: Diagnosis not present

## 2017-12-30 DIAGNOSIS — E1143 Type 2 diabetes mellitus with diabetic autonomic (poly)neuropathy: Secondary | ICD-10-CM | POA: Diagnosis not present

## 2017-12-31 ENCOUNTER — Encounter: Payer: Self-pay | Admitting: Internal Medicine

## 2017-12-31 ENCOUNTER — Telehealth: Payer: Self-pay | Admitting: Family Medicine

## 2017-12-31 ENCOUNTER — Ambulatory Visit (INDEPENDENT_AMBULATORY_CARE_PROVIDER_SITE_OTHER): Payer: PPO | Admitting: Internal Medicine

## 2017-12-31 VITALS — BP 118/80 | HR 56 | Temp 98.2°F | Resp 18 | Ht 65.0 in

## 2017-12-31 DIAGNOSIS — S91302A Unspecified open wound, left foot, initial encounter: Secondary | ICD-10-CM

## 2017-12-31 DIAGNOSIS — J4541 Moderate persistent asthma with (acute) exacerbation: Secondary | ICD-10-CM | POA: Diagnosis not present

## 2017-12-31 LAB — CULTURE, URINE COMPREHENSIVE

## 2017-12-31 MED ORDER — TIOTROPIUM BROMIDE MONOHYDRATE 1.25 MCG/ACT IN AERS: 2.0000 | INHALATION_SPRAY | Freq: Every day | RESPIRATORY_TRACT | 11 refills | Status: DC

## 2017-12-31 MED ORDER — IPRATROPIUM-ALBUTEROL 0.5-2.5 (3) MG/3ML IN SOLN: 3.0000 mL | Freq: Three times a day (TID) | RESPIRATORY_TRACT | 12 refills | Status: DC | PRN

## 2017-12-31 MED ORDER — METHYLPREDNISOLONE ACETATE 40 MG/ML IJ SUSP
40.0000 mg | Freq: Once | INTRAMUSCULAR | Status: AC
  Administered 2017-12-31: 40 mg via INTRAMUSCULAR

## 2017-12-31 MED ORDER — DOXYCYCLINE HYCLATE 100 MG PO TABS: 100.0000 mg | ORAL_TABLET | Freq: Two times a day (BID) | ORAL | 0 refills | Status: DC

## 2017-12-31 MED ORDER — NITROFURANTOIN MONOHYD MACRO 100 MG PO CAPS: 100.0000 mg | ORAL_CAPSULE | Freq: Two times a day (BID) | ORAL | 0 refills | Status: DC

## 2017-12-31 MED ORDER — FLUCONAZOLE 150 MG PO TABS: 150.0000 mg | ORAL_TABLET | Freq: Once | ORAL | 0 refills | Status: AC

## 2017-12-31 MED ORDER — IPRATROPIUM-ALBUTEROL 0.5-2.5 (3) MG/3ML IN SOLN: 3.0000 mL | Freq: Four times a day (QID) | RESPIRATORY_TRACT | Status: DC

## 2017-12-31 MED ORDER — PREDNISONE 20 MG PO TABS: 20.0000 mg | ORAL_TABLET | Freq: Every day | ORAL | 0 refills | Status: DC

## 2017-12-31 NOTE — Patient Instructions (Signed)
Call to schedule Dr. Raul Del appt   Asthma, Acute Bronchospasm Acute bronchospasm caused by asthma is also referred to as an asthma attack. Bronchospasm means your air passages become narrowed. The narrowing is caused by inflammation and tightening of the muscles in the air tubes (bronchi) in your lungs. This can make it hard to breathe or cause you to wheeze and cough. What are the causes? Possible triggers are:  Animal dander from the skin, hair, or feathers of animals.  Dust mites contained in house dust.  Cockroaches.  Pollen from trees or grass.  Mold.  Cigarette or tobacco smoke.  Air pollutants such as dust, household cleaners, hair sprays, aerosol sprays, paint fumes, strong chemicals, or strong odors.  Cold air or weather changes. Cold air may trigger inflammation. Winds increase molds and pollens in the air.  Strong emotions such as crying or laughing hard.  Stress.  Certain medicines such as aspirin or beta-blockers.  Sulfites in foods and drinks, such as dried fruits and wine.  Infections or inflammatory conditions, such as a flu, cold, or inflammation of the nasal membranes (rhinitis).  Gastroesophageal reflux disease (GERD). GERD is a condition where stomach acid backs up into your esophagus.  Exercise or strenuous activity.  What are the signs or symptoms?  Wheezing.  Excessive coughing, particularly at night.  Chest tightness.  Shortness of breath. How is this diagnosed? Your health care provider will ask you about your medical history and perform a physical exam. A chest X-ray or blood testing may be performed to look for other causes of your symptoms or other conditions that may have triggered your asthma attack. How is this treated? Treatment is aimed at reducing inflammation and opening up the airways in your lungs. Most asthma attacks are treated with inhaled medicines. These include quick relief or rescue medicines (such as bronchodilators) and  controller medicines (such as inhaled corticosteroids). These medicines are sometimes given through an inhaler or a nebulizer. Systemic steroid medicine taken by mouth or given through an IV tube also can be used to reduce the inflammation when an attack is moderate or severe. Antibiotic medicines are only used if a bacterial infection is present. Follow these instructions at home:  Rest.  Drink plenty of liquids. This helps the mucus to remain thin and be easily coughed up. Only use caffeine in moderation and do not use alcohol until you have recovered from your illness.  Do not smoke. Avoid being exposed to secondhand smoke.  You play a critical role in keeping yourself in good health. Avoid exposure to things that cause you to wheeze or to have breathing problems.  Keep your medicines up-to-date and available. Carefully follow your health care provider's treatment plan.  Take your medicine exactly as prescribed.  When pollen or pollution is bad, keep windows closed and use an air conditioner or go to places with air conditioning.  Asthma requires careful medical care. See your health care provider for a follow-up as advised. If you are more than [redacted] weeks pregnant and you were prescribed any new medicines, let your obstetrician know about the visit and how you are doing. Follow up with your health care provider as directed.  After you have recovered from your asthma attack, make an appointment with your outpatient doctor to talk about ways to reduce the likelihood of future attacks. If you do not have a doctor who manages your asthma, make an appointment with a primary care doctor to discuss your asthma. Get help right  away if:  You are getting worse.  You have trouble breathing. If severe, call your local emergency services (911 in the [place]  You develop chest pain or discomfort.  You are vomiting.  You are not able to keep fluids down.  You are coughing up yellow, green, brown,  or bloody sputum.  You have a fever and your symptoms suddenly get worse.  You have trouble swallowing. This information is not intended to replace advice given to you by your health care provider. Make sure you discuss any questions you have with your health care provider. Document Released: 06/24/2006 Document Revised: 08/21/2015 Document Reviewed: 09/14/2012 Elsevier Interactive Patient Education  2017 [employer].

## 2017-12-31 NOTE — Telephone Encounter (Signed)
Patient notified and Rx sent to pharmacy.

## 2017-12-31 NOTE — Telephone Encounter (Signed)
-----   Message from Nori Riis, PA-C sent at 12/31/2017 11:45 AM EDT ----- Please let Mrs. Kistler know that her urine culture came back positive for infection.  She will need to start Macrobid 100 mg twice daily for 7 days.  I would also like her to have a renal ultrasound.  Please put in the orders for the ultrasound.

## 2017-12-31 NOTE — Telephone Encounter (Signed)
Pt called office and I read message from Shannon °

## 2017-12-31 NOTE — Progress Notes (Signed)
Chief Complaint  Patient presents with  . URI    wheezing, coughing, productive cough of green mucous, worse in evening, possibly bronchitis   Asthma exacerbation  X 2 weeks had wheezing on and off has been doing albuterol neb 2x per day wheezing worse yesterday. She has not seen her lung MD in a while Dr. Raul Del and needs appt further refills . She is on Advair bid, singuliar, prn Albuterol and neb using bid prn. No sick contacts cough is worse qhs and in the evening   Left heel wound not healing x 18 months s/p skin graft 12/30/17 f/u in 1 week   Review of Systems  Respiratory: Positive for cough, shortness of breath and wheezing.   Cardiovascular: Negative for chest pain.  Gastrointestinal: Negative for abdominal pain.  Psychiatric/Behavioral: Negative for depression. The patient is nervous/anxious.    Past Medical History:  Diagnosis Date  . Abnormal antibody titer   . Anxiety and depression   . Arthritis   . Asthma   . Cystocele   . Depression   . Diabetes ([place])   . DVT (deep venous thrombosis) ([facility])    Righ calf  . Dysrhythmia   . Edema   . GERD (gastroesophageal reflux disease)   . Heart murmur    Echocardiogram June 2015: Mild MR (possible vegetation seen on TTE, not seen on TEE), normal LV size with moderate concentric LVH. Normal function EF 60-65%. Normal diastolic function. Mild LA dilation.  Marland Kitchen History of IBS   . History of methicillin resistant staphylococcus aureus (MRSA) 2008  . Hyperlipidemia   . Hypertension   . Hypothyroidism   . Insomnia   . Kidney stone    kidney stones with lithotripsy  . Neuropathy involving both lower extremities   . Obesity, Class II, BMI 35-39.9, with comorbidity   . Paroxysmal atrial fibrillation ([place]) 2007   In June 2015, Cardiac Event Monitor: Mostly SR/sinus arrhythmia with PVCs that are frequent. Short bursts of A. fib lasting several minutes;; CHA2DS2-VASc Score = 5 (age, Female, PVD, DM, HTN)  . Peripheral vascular disease  ([place])   . Sleep apnea    use C-PAP  . Urinary incontinence   . Venous stasis dermatitis of both lower extremities    Past Surgical History:  Procedure Laterality Date  . ANKLE SURGERY Right   . APPLICATION OF WOUND VAC Left 06/27/2015   Procedure: APPLICATION OF WOUND VAC ( POSSIBLE ) ;  Surgeon: [name], MD;  Location: [facility] ORS;  Service: Vascular;  Laterality: Left;  . APPLICATION OF WOUND VAC Left 09/25/2016   Procedure: APPLICATION OF WOUND VAC;  Surgeon: [name], DPM;  Location: [facility] ORS;  Service: Podiatry;  Laterality: Left;  . arm surgery     right    . CHOLECYSTECTOMY    . COLONOSCOPY    . COLONOSCOPY WITH PROPOFOL N/A 01/11/2017   Procedure: COLONOSCOPY WITH PROPOFOL;  Surgeon: [name], MD;  Location: [facility] ENDOSCOPY;  Service: Endoscopy;  Laterality: N/A;  . COLONOSCOPY WITH PROPOFOL N/A 02/22/2017   Procedure: COLONOSCOPY WITH PROPOFOL;  Surgeon: [name], MD;  Location: [facility] ENDOSCOPY;  Service: Endoscopy;  Laterality: N/A;  . COLONOSCOPY WITH PROPOFOL N/A 05/31/2017   Procedure: COLONOSCOPY WITH PROPOFOL;  Surgeon: [name], MD;  Location: [facility] ENDOSCOPY;  Service: Endoscopy;  Laterality: N/A;  . ESOPHAGOGASTRODUODENOSCOPY (EGD) WITH PROPOFOL N/A 01/11/2017   Procedure: ESOPHAGOGASTRODUODENOSCOPY (EGD) WITH PROPOFOL;  Surgeon: [name], MD;  Location: [facility] ENDOSCOPY;  Service:  Endoscopy;  Laterality: N/A;  . HERNIA REPAIR     umbilical  . HIATAL HERNIA REPAIR    . I&D EXTREMITY Left 06/27/2015   Procedure: IRRIGATION AND DEBRIDEMENT EXTREMITY            ( CALF HEMATOMA ) POSSIBLE WOUND VAC;  Surgeon: [name], MD;  Location: [facility] ORS;  Service: Vascular;  Laterality: Left;  . INCISION AND DRAINAGE OF WOUND Left 09/25/2016   Procedure: IRRIGATION AND DEBRIDEMENT - PARTIAL RESECTION OF ACHILLES TENDON WITH WOUND VAC APPLICATION;  Surgeon: [name], DPM;  Location: [facility] ORS;  Service: Podiatry;  Laterality: Left;  .  IRRIGATION AND DEBRIDEMENT ABSCESS Left 09/07/2016   Procedure: IRRIGATION AND DEBRIDEMENT ABSCESS LEFT HEEL;  Surgeon: [name], DPM;  Location: [facility] ORS;  Service: Podiatry;  Laterality: Left;  . JOINT REPLACEMENT  2013   left knee replacement  . KIDNEY SURGERY Right    kidney stones  . LITHOTRIPSY    . NM [facility] HX)  February 2017   Likely breast attenuation. LOW RISK study. Normal EF 55-60%.  . OTHER SURGICAL HISTORY     08/2016 or 09/2016 surgery on achilles tenso h/o staph infection heal. Dr. [name]  . removal of left hematoma Left    leg  . REVERSE SHOULDER ARTHROPLASTY Right 10/08/2015   Procedure: REVERSE SHOULDER ARTHROPLASTY;  Surgeon: [name], MD;  Location: [facility] ORS;  Service: Orthopedics;  Laterality: Right;  . SLEEVE GASTROPLASTY    . TONSILLECTOMY    . TOTAL KNEE ARTHROPLASTY    . TOTAL SHOULDER REPLACEMENT Right   . TRANSESOPHAGEAL ECHOCARDIOGRAM  08/08/2013   Mild LVH, EF 60-65%. Moderate LA dilation and mild RA dilation. Mild MR with no evidence of stenosis and no evidence of endocarditis. A false chordae is noted.  . TRANSTHORACIC ECHOCARDIOGRAM  08/03/2013   Mild-Moderate concentric LVH, EF 60-65%. Normal diastolic function. Mild LA dilation. Mild MR with possible vegetation  - not confirmed on TEE   . ULNAR NERVE TRANSPOSITION Right 08/06/2016   Procedure: ULNAR NERVE DECOMPRESSION/TRANSPOSITION;  Surgeon: [name], MD;  Location: [facility] ORS;  Service: Orthopedics;  Laterality: Right;  . UPPER GI ENDOSCOPY    . VAGINAL HYSTERECTOMY     Family History  Problem Relation Age of Onset  . Skin cancer Father   . Diabetes Father   . Hypertension Father   . Peripheral vascular disease Father   . Cancer Father   . Cerebral aneurysm Father   . Alcohol abuse Father   . Varicose Veins Mother   . Kidney disease Mother   . Arthritis Mother   . Mental illness Sister   . Cancer Maternal Aunt        breast  . Breast cancer Maternal Aunt   .  Arthritis Maternal Grandmother   . Hypertension Maternal Grandmother   . Diabetes Maternal Grandmother   . Arthritis Maternal Grandfather   . Heart disease Maternal Grandfather   . Hypertension Maternal Grandfather   . Arthritis Paternal Grandmother   . Hypertension Paternal Grandmother   . Diabetes Paternal Grandmother   . Arthritis Paternal Grandfather   . Hypertension Paternal Grandfather   . Bladder Cancer Neg Hx   . Kidney cancer Neg Hx    Social History   Socioeconomic History  . Marital status: Married    Spouse name: Not on file  . Number of children: Not on file  . Years of education: Not on file  . Highest education level:  Not on file  Occupational History  . Not on file  Social Needs  . Financial resource strain: Not on file  . Food insecurity:    Worry: Not on file    Inability: Not on file  . Transportation needs:    Medical: Not on file    Non-medical: Not on file  Tobacco Use  . Smoking status: Never Smoker  . Smokeless tobacco: Never Used  Substance and Sexual Activity  . Alcohol use: No    Alcohol/week: 0.0 standard drinks  . Drug use: No  . Sexual activity: Yes  Lifestyle  . Physical activity:    Days per week: Not on file    Minutes per session: Not on file  . Stress: Not on file  Relationships  . Social connections:    Talks on phone: Not on file    Gets together: Not on file    Attends religious service: Not on file    Active member of club or organization: Not on file    Attends meetings of clubs or organizations: Not on file    Relationship status: Not on file  . Intimate partner violence:    Fear of current or ex partner: Not on file    Emotionally abused: Not on file    Physically abused: Not on file    Forced sexual activity: Not on file  Other Topics Concern  . Not on file  Social History Narrative   She is currently married -- for 30+ years. Does not work. Does not smoke or take alcohol. She never smoked. She exercises at least  3 days a week since before her gastric surgery.   Marital status reviewed in history of present illness.   She has children and grandchildren    No outpatient medications have been marked as taking for the 12/31/17 encounter (Office Visit) with [name], [name], MD.   Current Facility-Administered Medications for the 12/31/17 encounter (Office Visit) with [name], [name], MD  Medication  . ipratropium-albuterol (DUONEB) 0.5-2.5 (3) MG/3ML nebulizer solution 3 mL   Allergies  Allergen Reactions  . Penicillins Hives, Shortness Of Breath and Swelling    Facial swelling Has patient had a PCN reaction causing immediate rash, facial/tongue/throat swelling, SOB or lightheadedness with hypotension: Yes Has patient had a PCN reaction causing severe rash involving mucus membranes or skin necrosis: Yes Has patient had a PCN reaction that required hospitalization No Has patient had a PCN reaction occurring within the last 10 years: No If all of the above answers are "NO", then may proceed with Cephalosporin use.   . Aspirin Hives  . Morphine And Related Other (See Comments)    Patient becomes very confused  . Terramycin [Oxytetracycline] Hives   Recent Results (from the past 2160 hour(s))  POCT glycosylated hemoglobin (Hb A1C)     Status: Abnormal   Collection Time: 11/17/17 10:58 AM  Result Value Ref Range   Hemoglobin A1C 6.1 (A) 4.0 - 5.6 %   HbA1c POC (<> result, manual entry)     HbA1c, POC (prediabetic range)     HbA1c, POC (controlled diabetic range)    Bladder Scan (Post Void Residual) in office     Status: None   Collection Time: 12/13/17  2:50 PM  Result Value Ref Range   Scan Result 41   Urinalysis, Complete     Status: Abnormal   Collection Time: 12/13/17  3:49 PM  Result Value Ref Range   Specific Gravity, UA >1.030 (H)  1.005 - 1.030   pH, UA 5.5 5.0 - 7.5   Color, UA Yellow Yellow   Appearance Ur Clear Clear   Leukocytes, UA Negative Negative   Protein,  UA Negative Negative/Trace   Glucose, UA Negative Negative   Ketones, UA Trace (A) Negative   RBC, UA Negative Negative   Bilirubin, UA Negative Negative   Urobilinogen, Ur 0.2 0.2 - 1.0 mg/dL   Nitrite, UA Negative Negative   Microscopic Examination See below:   Microscopic Examination     Status: Abnormal   Collection Time: 12/13/17  3:49 PM  Result Value Ref Range   WBC, UA 11-30 (A) 0 - 5 /hpf   RBC, UA None seen 0 - 2 /hpf   Epithelial Cells (non renal) 0-10 0 - 10 /hpf   Casts Present (A) None seen /lpf   Cast Type Hyaline casts N/A    Comment: Granular casts   Bacteria, UA Few None seen/Few  CULTURE, URINE COMPREHENSIVE     Status: None   Collection Time: 12/13/17  5:18 PM  Result Value Ref Range   Urine Culture, Comprehensive Final report    Organism ID, Bacteria Comment     Comment: Mixed urogenital flora 10,000-25,000 colony forming units per mL   Urinalysis, Complete     Status: Abnormal   Collection Time: 12/27/17  1:41 PM  Result Value Ref Range   Specific Gravity, UA >1.030 (H) 1.005 - 1.030   pH, UA 5.0 5.0 - 7.5   Color, UA Yellow Yellow   Appearance Ur Clear Clear   Leukocytes, UA Negative Negative   Protein, UA Negative Negative/Trace   Glucose, UA Negative Negative   Ketones, UA Negative Negative   RBC, UA Trace (A) Negative   Bilirubin, UA Negative Negative   Urobilinogen, Ur 0.2 0.2 - 1.0 mg/dL   Nitrite, UA Negative Negative   Microscopic Examination See below:   Microscopic Examination     Status: Abnormal   Collection Time: 12/27/17  1:41 PM  Result Value Ref Range   WBC, UA 0-5 0 - 5 /hpf   RBC, UA 3-10 (A) 0 - 2 /hpf   Epithelial Cells (non renal) 0-10 0 - 10 /hpf   Casts Present (A) None seen /lpf   Cast Type Hyaline casts N/A   Mucus, UA Present (A) Not Estab.   Bacteria, UA Moderate (A) None seen/Few  CULTURE, URINE COMPREHENSIVE     Status: Abnormal   Collection Time: 12/27/17  2:00 PM  Result Value Ref Range   Urine Culture,  Comprehensive Final report (A)    Organism ID, Bacteria Enterococcus faecalis (A)     Comment: 10,000-25,000 colony forming units per mL   Organism ID, Bacteria Comment     Comment: Mixed urogenital flora 1,000 Colonies/mL    ANTIMICROBIAL SUSCEPTIBILITY Comment     Comment:       ** S = Susceptible; I = Intermediate; R = Resistant **                    P = Positive; N = Negative             MICS are expressed in micrograms per mL    Antibiotic                 RSLT#1    RSLT#2    RSLT#3    RSLT#4 Ciprofloxacin  R Levofloxacin                   R Nitrofurantoin                 S Penicillin                     S Tetracycline                   R Vancomycin                     S    Objective  There is no height or weight on file to calculate BMI. Wt Readings from Last 3 Encounters:  12/13/17 259 lb 1.6 oz (117.5 kg)  11/24/17 262 lb (118.8 kg)  11/17/17 258 lb (117 kg)   Temp Readings from Last 3 Encounters:  12/31/17 98.2 F (36.8 C) (Oral)  11/24/17 97.6 F (36.4 C) (Oral)  11/17/17 98 F (36.7 C) (Oral)   BP Readings from Last 3 Encounters:  12/31/17 118/80  12/13/17 135/81  11/24/17 (!) 157/61   Pulse Readings from Last 3 Encounters:  12/31/17 (!) 56  12/13/17 (!) 41  11/24/17 (!) 57    Physical Exam  Constitutional: She is oriented to person, place, and time. Vital signs are normal. She appears well-developed and well-nourished. She is cooperative.  HENT:  Head: Normocephalic and atraumatic.  Mouth/Throat: Oropharynx is clear and moist and mucous membranes are normal.  Eyes: Pupils are equal, round, and reactive to light. Conjunctivae are normal.  Cardiovascular: Normal rate, regular rhythm and normal heart sounds.  Pulmonary/Chest: Effort normal. She has wheezes.  Neurological: She is alert and oriented to person, place, and time. Gait normal.  Using rollator today  Skin: Skin is warm, dry and intact.  Psychiatric: She has a normal mood and  affect. Her speech is normal and behavior is normal. Judgment and thought content normal. Cognition and memory are normal.  Nursing note and vitals reviewed.   Assessment   1. Asthma exacerbation persistent, mild to moderate  2. Left heel wound not heeling x 18 months improved not completely better  3. HM Plan   1. depromedrol 40 x 1 today duoneb x 1  Cont advair, prn albuterol change albuterol neb to duoneb for home use  Add spiriva  Prednisone 20 mg x 1 week  F/u with pulm  Consider CT chest if not better reviewed CT chest 2018 no sign lung pathology  2. F/u podiatry  3.  Had flu will need again utd pna 23, prevnar  Consider Tdap and shingrix in future Given Rx twinrixin pastto get at pharmacy h/o fatty liver  DEXA 10/17/14 normal  Out of age window pap Ob/gyn  mammo neg5/20/19 negative Colonoscopy h/o polyps 02/22/17 prep not good needs to f/u and resch with [place] 05/31/17 Had due in 5 years from 05/31/17 [facility] GI  Provider: Dr. Olivia Mackie [name]-Internal Medicine

## 2018-01-04 ENCOUNTER — Other Ambulatory Visit: Payer: Self-pay | Admitting: Urology

## 2018-01-04 DIAGNOSIS — N390 Urinary tract infection, site not specified: Secondary | ICD-10-CM

## 2018-01-04 NOTE — Progress Notes (Unsigned)
I have placed the orders in for a renal ultrasound for recurrent UTI's.  She needs an office visit to discuss the results.

## 2018-01-05 ENCOUNTER — Ambulatory Visit: Payer: PPO | Attending: Surgery | Admitting: Physical Therapy

## 2018-01-11 DIAGNOSIS — L97521 Non-pressure chronic ulcer of other part of left foot limited to breakdown of skin: Secondary | ICD-10-CM | POA: Diagnosis not present

## 2018-01-11 DIAGNOSIS — E1143 Type 2 diabetes mellitus with diabetic autonomic (poly)neuropathy: Secondary | ICD-10-CM | POA: Diagnosis not present

## 2018-01-12 ENCOUNTER — Telehealth: Payer: Self-pay | Admitting: Urology

## 2018-01-12 NOTE — Telephone Encounter (Signed)
App made and patient is aware  MB

## 2018-01-13 ENCOUNTER — Ambulatory Visit
Admission: RE | Admit: 2018-01-13 | Discharge: 2018-01-13 | Disposition: A | Discharge status: Home / Self Care | Payer: PPO | Source: Ambulatory Visit | Attending: Urology | Admitting: Urology

## 2018-01-13 ENCOUNTER — Telehealth: Payer: Self-pay | Admitting: *Deleted

## 2018-01-13 DIAGNOSIS — N390 Urinary tract infection, site not specified: Secondary | ICD-10-CM | POA: Diagnosis not present

## 2018-01-13 DIAGNOSIS — N200 Calculus of kidney: Secondary | ICD-10-CM | POA: Insufficient documentation

## 2018-01-13 NOTE — Telephone Encounter (Signed)
Copied from [place] [id number]. Topic: General - Other >> Jan 13, 2018  8:39 AM [name] wrote: Reason for CRM: pt would like to pick up a print out of all her appointment date and time from jan 2018 to present. Pt will like to pick up print out on 01/18/18. Pt was dr Lacinda Axon pt

## 2018-01-13 NOTE — Telephone Encounter (Signed)
Patient is aware,

## 2018-01-13 NOTE — Telephone Encounter (Signed)
Ok. The appointment dates that pt requested has been printed out and is up front in envelope.

## 2018-01-18 ENCOUNTER — Ambulatory Visit (INDEPENDENT_AMBULATORY_CARE_PROVIDER_SITE_OTHER): Payer: PPO | Admitting: Urology

## 2018-01-18 ENCOUNTER — Encounter: Payer: Self-pay | Admitting: Urology

## 2018-01-18 VITALS — BP 108/70 | Ht 65.0 in | Wt 262.2 lb

## 2018-01-18 DIAGNOSIS — Z87448 Personal history of other diseases of urinary system: Secondary | ICD-10-CM | POA: Diagnosis not present

## 2018-01-18 DIAGNOSIS — N3946 Mixed incontinence: Secondary | ICD-10-CM | POA: Diagnosis not present

## 2018-01-18 DIAGNOSIS — N200 Calculus of kidney: Secondary | ICD-10-CM | POA: Diagnosis not present

## 2018-01-18 DIAGNOSIS — N952 Postmenopausal atrophic vaginitis: Secondary | ICD-10-CM | POA: Diagnosis not present

## 2018-01-18 DIAGNOSIS — N390 Urinary tract infection, site not specified: Secondary | ICD-10-CM | POA: Diagnosis not present

## 2018-01-18 LAB — URINALYSIS, COMPLETE
Bilirubin, UA: NEGATIVE
GLUCOSE, UA: NEGATIVE
Ketones, UA: NEGATIVE
Leukocytes, UA: NEGATIVE
NITRITE UA: NEGATIVE
PROTEIN UA: NEGATIVE
RBC, UA: NEGATIVE
Urobilinogen, Ur: 0.2 mg/dL (ref 0.2–1.0)
pH, UA: 5 (ref 5.0–7.5)

## 2018-01-18 LAB — MICROSCOPIC EXAMINATION
RBC MICROSCOPIC, UA: NONE SEEN /HPF (ref 0–2)
WBC, UA: NONE SEEN /hpf (ref 0–5)

## 2018-01-18 NOTE — Progress Notes (Addendum)
01/18/2018 3:28 PM   [name] June 10, 1950 [id number]  Referring provider: [name], [name], MD [place] [place], [place] [address]  Chief Complaint  Patient presents with  . Recurrent UTI    HPI: Patient is a 67 year old Caucasian female with a history of urinary incontinence, vaginal atrophy, history of renal stones and rUTI's who presents today for follow up.    Urinary incontinence Patient has been on Myrbetriq 25 mg and oxybutynin XL 15 mg daily.  Her PVR on today's exam is 41 mL.  She states that she is losing urine when she stands.  She is wearing depends daily.  She could not find a pessary that would fit.  She is experiencing urgency x 4-7 (unchanged), frequency x 8  or more (unchanged), is restricting fluids to avoid visits to the restroom, is engaging in toilet mapping, incontinence x 8 or more (unchanged) and nocturia x 0-3 (unchanged).  She has not had PT for her pelvic floor.  She is not interested in surgery.    Vaginal atrophy She is using vaginal estrogen cream three nights weekly.   History of kidney stones She has not had any recent flank pain or gross hematuria.  A CT scan performed in 2014 did not note any nephrolithiasis. CT of the adrenal's on 08/12/2017 noted a nonobstructive left renal calculus is noted. No hydronephrosis or renal obstruction is noted. Bilateral renal cortical scarring is noted.  Recurrent UTIs Risk factors: age, vaginal atrophy and incontinence.  RUS on no acute renal or urinary bladder findings.  Chronic left nephrolithiasis, and a degree of chronic renal cortical atrophy is suspected.  She is not having symptoms of urinary tract infection today.  Her UA with moderate bacteria.    PMH: Past Medical History:  Diagnosis Date  . Abnormal antibody titer   . Anxiety and depression   . Arthritis   . Asthma   . Cystocele   . Depression   . Diabetes ([place])   . DVT (deep venous thrombosis) (HCC)    Righ calf  . Dysrhythmia    . Edema   . GERD (gastroesophageal reflux disease)   . Heart murmur    Echocardiogram June 2015: Mild MR (possible vegetation seen on TTE, not seen on TEE), normal LV size with moderate concentric LVH. Normal function EF 60-65%. Normal diastolic function. Mild LA dilation.  Marland Kitchen History of IBS   . History of methicillin resistant staphylococcus aureus (MRSA) 2008  . Hyperlipidemia   . Hypertension   . Hypothyroidism   . Insomnia   . Kidney stone    kidney stones with lithotripsy  . Neuropathy involving both lower extremities   . Obesity, Class II, BMI 35-39.9, with comorbidity   . Paroxysmal atrial fibrillation ([place]) 2007   In June 2015, Cardiac Event Monitor: Mostly SR/sinus arrhythmia with PVCs that are frequent. Short bursts of A. fib lasting several minutes;; CHA2DS2-VASc Score = 5 (age, Female, PVD, DM, HTN)  . Peripheral vascular disease ([place])   . Sleep apnea    use C-PAP  . Urinary incontinence   . Venous stasis dermatitis of both lower extremities     Surgical History: Past Surgical History:  Procedure Laterality Date  . ANKLE SURGERY Right   . APPLICATION OF WOUND VAC Left 06/27/2015   Procedure: APPLICATION OF WOUND VAC ( POSSIBLE ) ;  Surgeon: [name], MD;  Location: [facility] ORS;  Service: Vascular;  Laterality: Left;  . APPLICATION OF WOUND VAC Left 09/25/2016  Procedure: APPLICATION OF WOUND VAC;  Surgeon: [name], DPM;  Location: [facility] ORS;  Service: Podiatry;  Laterality: Left;  . arm surgery     right    . CHOLECYSTECTOMY    . COLONOSCOPY    . COLONOSCOPY WITH PROPOFOL N/A 01/11/2017   Procedure: COLONOSCOPY WITH PROPOFOL;  Surgeon: [name], MD;  Location: [facility] ENDOSCOPY;  Service: Endoscopy;  Laterality: N/A;  . COLONOSCOPY WITH PROPOFOL N/A 02/22/2017   Procedure: COLONOSCOPY WITH PROPOFOL;  Surgeon: [name], MD;  Location: [facility] END[facility]COPY;  Service: Endoscopy;  Laterality: N/A;  . COLONOSCOPY WITH PROPOFOL N/A 05/31/2017   Procedure:  COLONOSCOPY WITH PROPOFOL;  Surgeon: [name], MD;  Location: [facility] ENDOSCOPY;  Service: Endoscopy;  Laterality: N/A;  . ESOPHAGOGASTRODUODENOSCOPY (EGD) WITH PROPOFOL N/A 01/11/2017   Procedure: ESOPHAGOGASTRODUODENOSCOPY (EGD) WITH PROPOFOL;  Surgeon: [name], MD;  Location: [facility] ENDOSCOPY;  Service: Endoscopy;  Laterality: N/A;  . HERNIA REPAIR     umbilical  . HIATAL HERNIA REPAIR    . I&D EXTREMITY Left 06/27/2015   Procedure: IRRIGATION AND DEBRIDEMENT EXTREMITY            ( CALF HEMATOMA ) POSSIBLE WOUND VAC;  Surgeon: [name], MD;  Location: [facility] ORS;  Service: Vascular;  Laterality: Left;  . INCISION AND DRAINAGE OF WOUND Left 09/25/2016   Procedure: IRRIGATION AND DEBRIDEMENT - PARTIAL RESECTION OF ACHILLES TENDON WITH WOUND VAC APPLICATION;  Surgeon: [name], DPM;  Location: [facility] ORS;  Service: Podiatry;  Laterality: Left;  . IRRIGATION AND DEBRIDEMENT ABSCESS Left 09/07/2016   Procedure: IRRIGATION AND DEBRIDEMENT ABSCESS LEFT HEEL;  Surgeon: [name], DPM;  Location: [facility] ORS;  Service: Podiatry;  Laterality: Left;  . JOINT REPLACEMENT  2013   left knee replacement  . KIDNEY SURGERY Right    kidney stones  . LITHOTRIPSY    . NM [facility] HX)  February 2017   Likely breast attenuation. LOW RISK study. Normal EF 55-60%.  . OTHER SURGICAL HISTORY     08/2016 or 09/2016 surgery on achilles tenso h/o staph infection heal. Dr. [name]  . removal of left hematoma Left    leg  . REVERSE SHOULDER ARTHROPLASTY Right 10/08/2015   Procedure: REVERSE SHOULDER ARTHROPLASTY;  Surgeon: [name], MD;  Location: [facility] ORS;  Service: Orthopedics;  Laterality: Right;  . SLEEVE GASTROPLASTY    . TONSILLECTOMY    . TOTAL KNEE ARTHROPLASTY    . TOTAL SHOULDER REPLACEMENT Right   . TRANSESOPHAGEAL ECHOCARDIOGRAM  08/08/2013   Mild LVH, EF 60-65%. Moderate LA dilation and mild RA dilation. Mild MR with no evidence of stenosis and no evidence of  endocarditis. A false chordae is noted.  . TRANSTHORACIC ECHOCARDIOGRAM  08/03/2013   Mild-Moderate concentric LVH, EF 60-65%. Normal diastolic function. Mild LA dilation. Mild MR with possible vegetation  - not confirmed on TEE   . ULNAR NERVE TRANSPOSITION Right 08/06/2016   Procedure: ULNAR NERVE DECOMPRESSION/TRANSPOSITION;  Surgeon: [name], MD;  Location: [facility] ORS;  Service: Orthopedics;  Laterality: Right;  . UPPER GI ENDOSCOPY    . VAGINAL HYSTERECTOMY      Home Medications:  Allergies as of 01/18/2018      Reactions   Penicillins Hives, Shortness Of Breath, Swelling   Facial swelling Has patient had a PCN reaction causing immediate rash, facial/tongue/throat swelling, SOB or lightheadedness with hypotension: Yes Has patient had a PCN reaction causing severe rash involving mucus membranes or skin necrosis: Yes  Has patient had a PCN reaction that required hospitalization No Has patient had a PCN reaction occurring within the last 10 years: No If all of the above answers are "NO", then may proceed with Cephalosporin use.   Aspirin Hives   Morphine And Related Other (See Comments)   Patient becomes very confused   Terramycin [oxytetracycline] Hives      Medication List        Accurate as of 01/18/18  3:28 PM. Always use your most recent med list.          albuterol (2.5 MG/3ML) 0.083% nebulizer solution Commonly known as:  PROVENTIL Take 3 mLs (2.5 mg total) by nebulization every 6 (six) hours as needed for wheezing or shortness of breath.   albuterol 108 (90 Base) MCG/ACT inhaler Commonly known as:  PROVENTIL HFA;VENTOLIN HFA Inhale 1-2 puffs into the lungs every 4 (four) hours as needed for wheezing or shortness of breath.   ALPRAZolam 0.5 MG tablet Commonly known as:  XANAX Take 1 tablet (0.5 mg total) by mouth 2 (two) times daily as needed for anxiety.   amLODipine 5 MG tablet Commonly known as:  NORVASC Take 1 tablet (5 mg total) by mouth daily.     benzonatate 200 MG capsule Commonly known as:  TESSALON Take 1 capsule (200 mg total) by mouth 3 (three) times daily as needed for cough.   buPROPion 300 MG 24 hr tablet Commonly known as:  WELLBUTRIN XL Take 1 tablet (300 mg total) by mouth daily.   clobetasol cream 0.05 % Commonly known as:  TEMOVATE Apply 1 application topically 2 (two) times daily. To arms   cyclobenzaprine 10 MG tablet Commonly known as:  FLEXERIL Take 10 mg by mouth as needed.   dabigatran 150 MG Caps capsule Commonly known as:  PRADAXA Take 1 capsule (150 mg total) by mouth 2 (two) times daily.   dexlansoprazole 60 MG capsule Commonly known as:  DEXILANT Take 1 capsule (60 mg total) by mouth daily. Minutes before meal (lunch)   doxycycline 100 MG tablet Commonly known as:  VIBRA-TABS Take 1 tablet (100 mg total) by mouth 2 (two) times daily.   DULoxetine 60 MG capsule Commonly known as:  CYMBALTA Take 1 capsule (60 mg total) by mouth daily.   estradiol 0.1 MG/GM vaginal cream Commonly known as:  ESTRACE Apply 0.5mg  (pea-sized amount)  just inside the vaginal introitus with a finger-tip on Monday, Wednesday and Friday nights,   ferrous sulfate 325 (65 FE) MG tablet Take 325 mg by mouth daily with breakfast.   fluconazole 150 MG tablet Commonly known as:  DIFLUCAN Take 150 mg by mouth as needed.   Fluticasone-Salmeterol 250-50 MCG/DOSE Aepb Commonly known as:  ADVAIR Inhale 1 puff into the lungs 2 (two) times daily.   gabapentin 800 MG tablet Commonly known as:  NEURONTIN Take 1 tablet (800 mg total) by mouth 4 (four) times daily.   hydrochlorothiazide 12.5 MG tablet Commonly known as:  HYDRODIURIL Take 1 tablet (12.5 mg total) by mouth daily. In am   HYDROcodone-acetaminophen 5-325 MG tablet Commonly known as:  NORCO/VICODIN Take 1 tablet by mouth every 8 (eight) hours as needed for severe pain.   HYDROcodone-acetaminophen 5-325 MG tablet Commonly known as:  NORCO/VICODIN Take 1  tablet by mouth every 8 (eight) hours as needed for severe pain. Start taking on:  02/07/2018   ipratropium-albuterol 0.5-2.5 (3) MG/3ML Soln Commonly known as:  DUONEB Take 3 mLs by nebulization 3 (three) times daily as  needed.   levocetirizine 5 MG tablet Commonly known as:  XYZAL TAKE 1 TABLET BY MOUTH EVERY EVENING   levothyroxine 175 MCG tablet Commonly known as:  SYNTHROID, LEVOTHROID Take 1 tablet (175 mcg total) by mouth daily before breakfast.   metFORMIN 1000 MG tablet Commonly known as:  GLUCOPHAGE TAKE ONE TABLET BY MOUTH TWICE DAILY   metoprolol succinate 100 MG 24 hr tablet Commonly known as:  TOPROL-XL Take 1 tablet (100 mg total) by mouth daily. Take with or immediately following a meal.   mirabegron ER 50 MG Tb24 tablet Commonly known as:  MYRBETRIQ Take 1 tablet (50 mg total) by mouth daily.   montelukast 10 MG tablet Commonly known as:  SINGULAIR TAKE ONE TABLET BY MOUTH EVERY DAY   multivitamin with minerals Tabs tablet Take 1 tablet by mouth daily.   mupirocin ointment 2 % Commonly known as:  BACTROBAN Apply 1 application topically 2 (two) times daily. Skin wounds   nitrofurantoin (macrocrystal-monohydrate) 100 MG capsule Commonly known as:  MACROBID Take 1 capsule (100 mg total) by mouth every 12 (twelve) hours.   nystatin cream Commonly known as:  MYCOSTATIN Apply 1 application topically 2 (two) times daily. Mouth prn for 7 days as needed   ondansetron 4 MG tablet Commonly known as:  ZOFRAN Take 1 tablet (4 mg total) every 8 (eight) hours as needed by mouth for nausea or vomiting.   oxybutynin 15 MG 24 hr tablet Commonly known as:  DITROPAN XL TAKE ONE TABLET BY MOUTH AT BEDTIME   oxybutynin 15 MG 24 hr tablet Commonly known as:  DITROPAN XL TAKE ONE TABLET BY MOUTH AT BEDTIME   predniSONE 20 MG tablet Commonly known as:  DELTASONE Take 1 tablet (20 mg total) by mouth daily with breakfast.   quinapril 40 MG tablet Commonly known  as:  ACCUPRIL Take 1 tablet (40 mg total) by mouth daily.   Tiotropium Bromide Monohydrate 1.25 MCG/ACT Aers Inhale 2 puffs into the lungs daily.   triamcinolone cream 0.1 % Commonly known as:  KENALOG Apply 1 application 2 (two) times daily as needed topically.   vitamin B-12 500 MCG tablet Commonly known as:  CYANOCOBALAMIN Take 500 mcg by mouth daily.   VOLTAREN 1 % Gel Generic drug:  diclofenac sodium Apply 1 application topically as needed.   zinc oxide 20 % ointment Apply 1 application topically as needed for irritation.       Allergies:  Allergies  Allergen Reactions  . Penicillins Hives, Shortness Of Breath and Swelling    Facial swelling Has patient had a PCN reaction causing immediate rash, facial/tongue/throat swelling, SOB or lightheadedness with hypotension: Yes Has patient had a PCN reaction causing severe rash involving mucus membranes or skin necrosis: Yes Has patient had a PCN reaction that required hospitalization No Has patient had a PCN reaction occurring within the last 10 years: No If all of the above answers are "NO", then may proceed with Cephalosporin use.   . Aspirin Hives  . Morphine And Related Other (See Comments)    Patient becomes very confused  . Terramycin [Oxytetracycline] Hives    Family History: Family History  Problem Relation Age of Onset  . Skin cancer Father   . Diabetes Father   . Hypertension Father   . Peripheral vascular disease Father   . Cancer Father   . Cerebral aneurysm Father   . Alcohol abuse Father   . Varicose Veins Mother   . Kidney disease Mother   .  Arthritis Mother   . Mental illness Sister   . Cancer Maternal Aunt        breast  . Breast cancer Maternal Aunt   . Arthritis Maternal Grandmother   . Hypertension Maternal Grandmother   . Diabetes Maternal Grandmother   . Arthritis Maternal Grandfather   . Heart disease Maternal Grandfather   . Hypertension Maternal Grandfather   . Arthritis Paternal  Grandmother   . Hypertension Paternal Grandmother   . Diabetes Paternal Grandmother   . Arthritis Paternal Grandfather   . Hypertension Paternal Grandfather   . Bladder Cancer Neg Hx   . Kidney cancer Neg Hx     Social History:  reports that she has never smoked. She has never used smokeless tobacco. She reports that she does not drink alcohol or use drugs.  ROS: UROLOGY Frequent Urination?: No Hard to postpone urination?: Yes Burning/pain with urination?: No Get up at night to urinate?: No Leakage of urine?: Yes Urine stream starts and stops?: No Trouble starting stream?: Yes Do you have to strain to urinate?: Yes Blood in urine?: No Urinary tract infection?: Yes Sexually transmitted disease?: No Injury to kidneys or bladder?: No Painful intercourse?: No Weak stream?: No Currently pregnant?: No Vaginal bleeding?: No Last menstrual period?: n  Gastrointestinal Nausea?: No Vomiting?: No Indigestion/heartburn?: Yes Diarrhea?: No Constipation?: No  Constitutional Fever: No Night sweats?: No Weight loss?: No Fatigue?: Yes  Skin Skin rash/lesions?: No Itching?: No  Eyes Blurred vision?: No Double vision?: No  Ears/Nose/Throat Sore throat?: No Sinus problems?: Yes  Hematologic/Lymphatic Swollen glands?: No Easy bruising?: Yes  Cardiovascular Leg swelling?: No Chest pain?: No  Respiratory Cough?: No Shortness of breath?: Yes  Endocrine Excessive thirst?: No  Musculoskeletal Back pain?: No Joint pain?: Yes  Neurological Headaches?: No Dizziness?: No  Psychologic Depression?: No Anxiety?: Yes  Physical Exam: BP 108/70 (BP Location: Left Arm, Patient Position: Sitting, Cuff Size: Large)   Ht 5\' 5"  (1.651 m)   Wt 262 lb 3.2 oz (118.9 kg)   BMI 43.63 kg/m   Constitutional: Well nourished. Alert and oriented, No acute distress. HEENT: [place] AT, moist mucus membranes. Trachea midline, no masses. Cardiovascular: No clubbing, cyanosis, or  edema. Respiratory: Normal respiratory effort, no increased work of breathing. GI: Abdomen is soft, non tender, non distended, no abdominal masses. Liver and spleen not palpable.  No hernias appreciated.  Stool sample for occult testing is not indicated.   GU: No CVA tenderness.  No bladder fullness or masses.   Skin: No rashes, bruises or suspicious lesions. Lymph: No cervical or inguinal adenopathy. Neurologic: Grossly intact, no focal deficits, moving all 4 extremities. Psychiatric: Normal mood and affect.  Pertinent imaging CLINICAL DATA:  67 year old female with recurrent urinary tract infections.  EXAM: RENAL / URINARY TRACT ULTRASOUND COMPLETE  COMPARISON:  CT Abdomen and Pelvis 08/12/2017 and earlier.  FINDINGS: Right Kidney:  Length: 11.2 centimeters. Nodular right renal contour re-demonstrated. Some cortical volume loss suspected. Echogenicity within normal limits. No mass or hydronephrosis visualized.  Left Kidney:  Length: 10.8 centimeters. Nodular contour re-demonstrated with some cortical volume loss suspected. Left midpole nephrolithiasis re-demonstrated on image 24. Echogenicity within normal limits. No mass or hydronephrosis visualized.  Bladder:  Diminutive bladder. Appears normal for degree of bladder distention.  IMPRESSION: 1. No acute renal or urinary bladder findings. 2. Chronic left nephrolithiasis, and a degree of chronic renal cortical atrophy is suspected.   Electronically Signed   By: Genevie Ann M.D.   On: 01/13/2018 17:41  I have independently reviewed the films and left renal stone is seen.    Assessment & Plan:    1. Recurrent UTI's Currently asymptomatic   2. Left kidney stone No intervention warranted at this time Will continue to monitor KUB in one year                         3. Mixed urinary incontinence Continue Myrbetriq  25 mg daily and oxybutynin XL 15 mg daily Patient could not tolerate the pessary - she does  not want surgery -She deferred PT  She is quite exasperated with her urinary incontinence and wants to know if there are any other options for her I explained that I have exhausted my therapy options, but if she would like an appointment with Dr. Matilde Sprang for further discussion that would be reasonable as she may need UDS and/or cystoscopy due to her history of rUTI's as well  4. Atrophic vaginitis  - continue vaginal estrogen cream three nights weekly  5. Gross hematuria Associated with UTI, has since cleared and no AMH seen on today's UA   Zara Council, [facility]  [place] [place], [place] [address] [phone]

## 2018-01-23 IMAGING — DX DG CHEST 2V
2 series · 2 of 2 positions shown · non-contrast
Comparison: PA and lateral chest x-ray October 12, 2015 and October 09, 2015.

CLINICAL DATA: Exertional shortness of breath, recent pneumonia,
recent right shoulder joint replacement.

EXAM:
CHEST  2 VIEW

[chest pa]
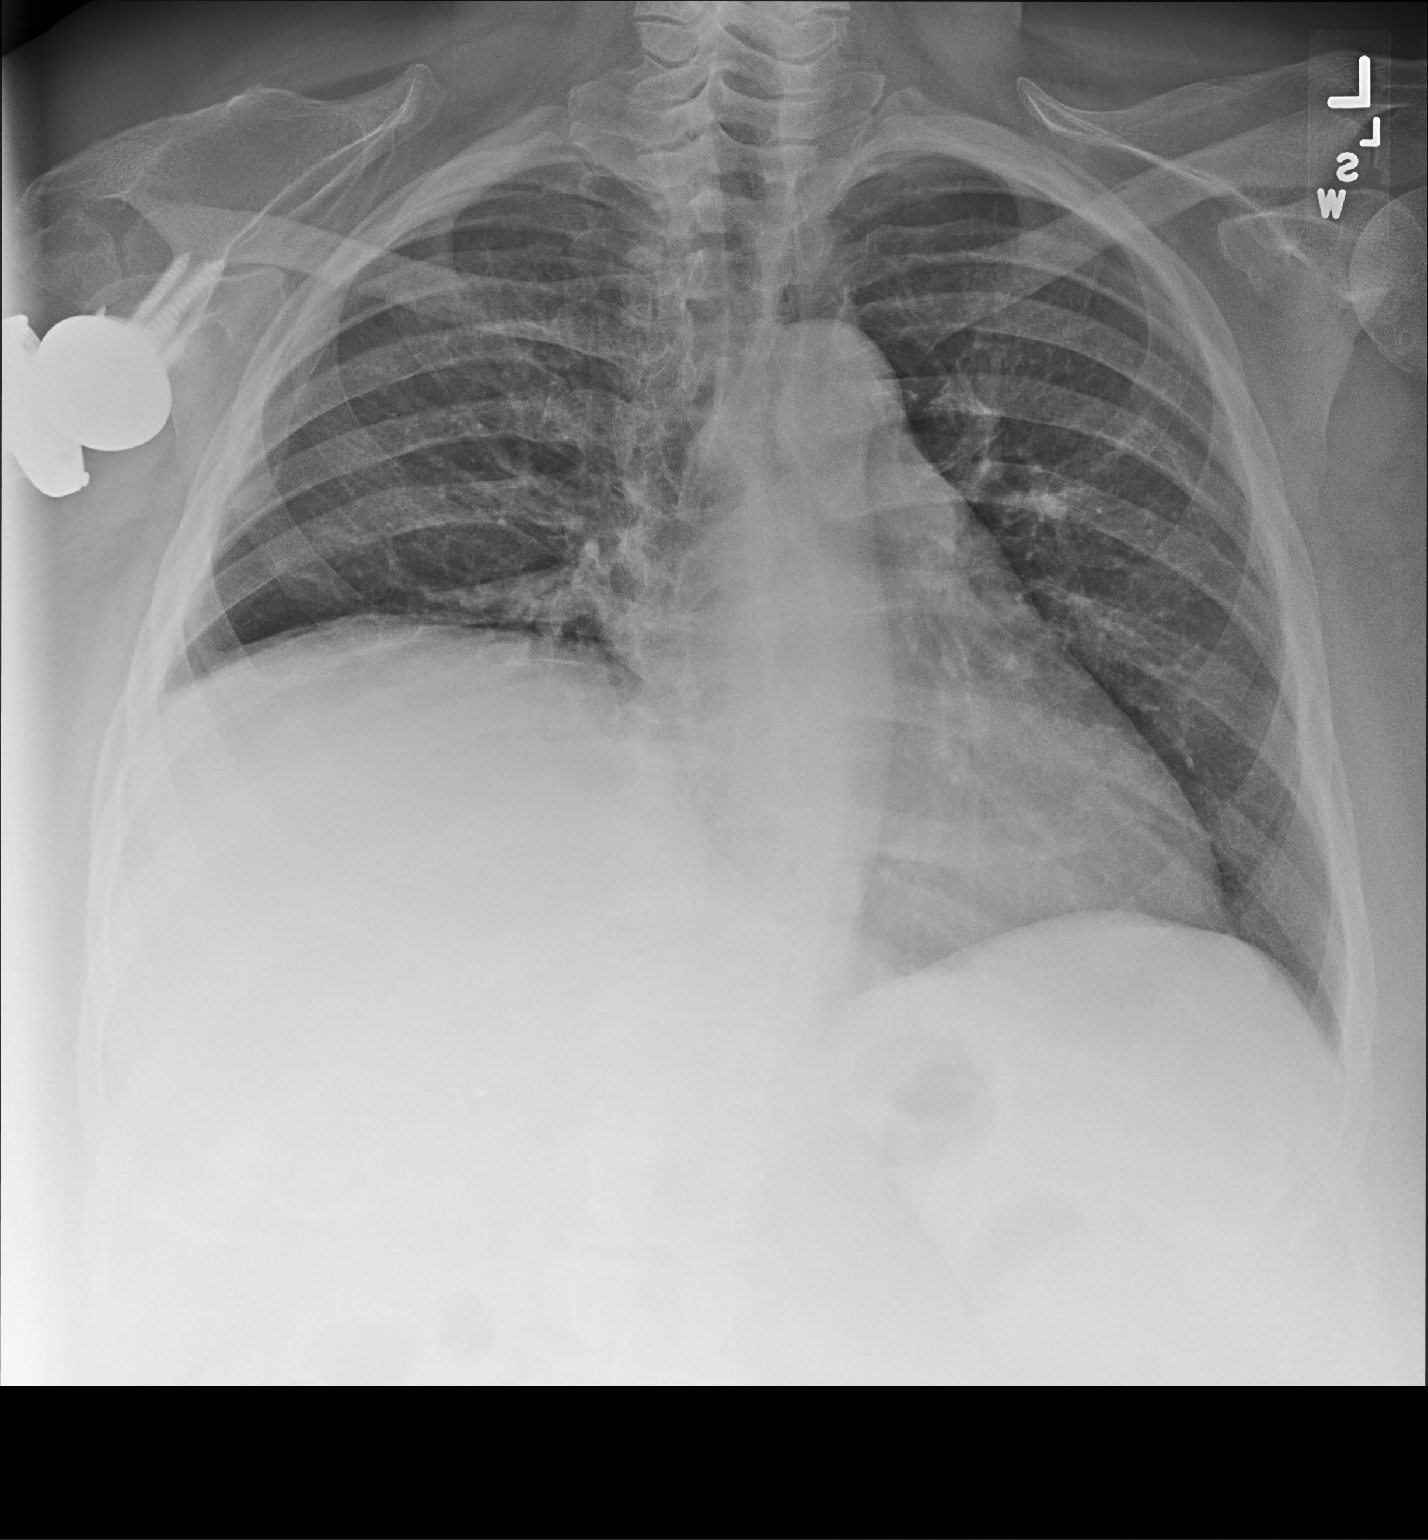

[chest lat]
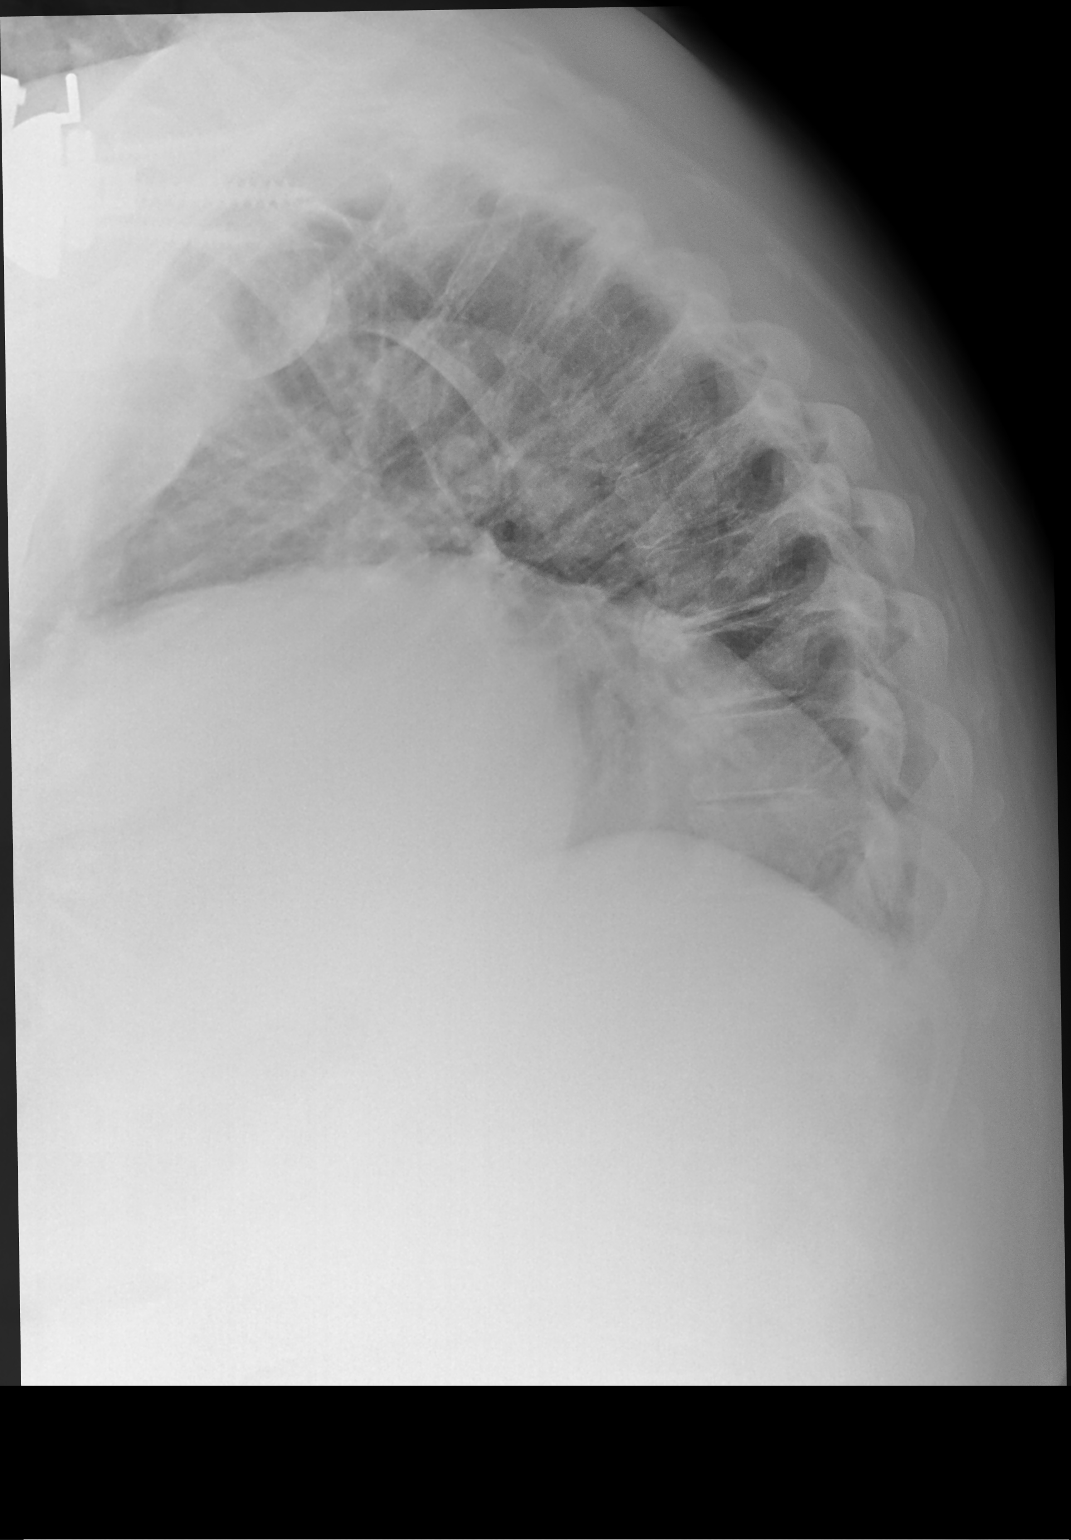

[2 of 2 positions shown; findings below may reference images not displayed]

FINDINGS: There is chronic elevation of the right hemidiaphragm with resultant
hypo inflation of the right lung. The left lung is well-expanded.
There is no focal infiltrate. There is no pleural effusion or
pneumothorax. The cardiac silhouette is enlarged though stable. The
pulmonary vascularity is not engorged. The bony thorax exhibits no
acute abnormality. A prosthetic right shoulder joint is partially
included in the field of view.
IMPRESSION: Elevation of the right hemidiaphragm and cardiomegaly, stable. No
pneumonia nor pulmonary edema.

## 2018-01-24 ENCOUNTER — Other Ambulatory Visit: Payer: Self-pay

## 2018-01-24 NOTE — Patient Outreach (Signed)
[place] [facility]) Care Management  01/24/2018  Sherlon Nied 1950/06/30 [id number]   Telephone Screen  Referral Date: 01/21/18 Referral Source: HTA Concierge Referral Reason: "member in need of help obtaining a ramp,shower bars and any other assistance available to assist with reducing fall risks" Insurance: HTA   Voicemail message received from patient. Return call to patient. Spoke with patient. She resides in her home along with her spouse. She voices that she requires assistance with ADLs/IADLs. Patient reports that back in 2018 she had a major staph infection to her foot. She had complications during the healing process and her achilles tendon became infected and had to be removed. She has had some skin grafting to area. She reports that as a result of this she has "balance issues." She has walker in the home which she is using to get around and voices she underwent almost 6 months of therapy. She voices that she has steps in her front and back door. She is having more difficulty getting up and down steps. Her husband has to assist her every time. She is concerned because now he is having back problems of his own.. Patient voices that she is trying to get a ramp installed to ease with getting in and out of her home. She is also requesting resources on possible in home modifications especially to her bathroom. Patient would like to have her bathroom remodeled to be handicap accessible. She voices that her spouse takes her to appts and she also has supportive sons that assist as needed. Patient reports she is taking about 15 meds. She denies any issues affording and /or managing meds. She voices that she fills her med planner every two weeks. Patient has PMH of urinary incontinence, vaginal atrophy, renal stones, recurrent UTIs, arthritis and anxiety/depression. [facility] services reviewed and discussed with patient. Patient voices that she only needs SW assistance at this time and gave  verbal consent for services.     Plan: RN CM will send [facility] SW referral for possible community resources regarding ramp and in home modifications.   Enzo Montgomery, RN,BSN,CCM [place] Management Telephonic Care Management Coordinator Direct Phone: [phone] Toll Free: [phone] Fax: [phone]

## 2018-01-24 NOTE — Patient Outreach (Signed)
[place] [facility]) Care Management  01/24/2018  [name] 08-Dec-1950 [id number]   Telephone Screen  Referral Date: 01/21/18 Referral Source: HTA Concierge Referral Reason: "member in need of help obtaining a ramp,shower bars and any other assistance available to assist with reducing fall risks" Insurance: HTA   Outreach attempt #1 to patient. No answer at present. RN CM left HIPAA compliant voicemail message along with contact info.     Plan: RN CM will make outreach attempt to patient within 3-4 business days.  RN CM will send unsuccessful outreach letter to patient.    Enzo Montgomery, RN,BSN,CCM [place] Management Telephonic Care Management Coordinator Direct Phone: [phone] Toll Free: [phone] Fax: [phone]

## 2018-01-25 ENCOUNTER — Other Ambulatory Visit: Payer: Self-pay | Admitting: Internal Medicine

## 2018-01-26 ENCOUNTER — Other Ambulatory Visit: Payer: Self-pay | Admitting: *Deleted

## 2018-01-26 DIAGNOSIS — L97521 Non-pressure chronic ulcer of other part of left foot limited to breakdown of skin: Secondary | ICD-10-CM | POA: Diagnosis not present

## 2018-01-26 DIAGNOSIS — E1143 Type 2 diabetes mellitus with diabetic autonomic (poly)neuropathy: Secondary | ICD-10-CM | POA: Diagnosis not present

## 2018-01-26 NOTE — Patient Outreach (Signed)
[place] [facility]) Care Management  01/26/2018  [name] Jul 30, 1950 [id number]   Patient referred to this social worker by the telephonic [facility] RNCM to provide patient with resources for a wheelchair ramp and home modifications. Patient contacted, however ws on her way to a medical appointment and asked if this social worker can call her back tomorrow.  Plan: This social worker will call patient back tomorrow to provide resources for home modification.   Sheralyn Boatman [facility] Care Management [phone]

## 2018-01-27 ENCOUNTER — Ambulatory Visit: Payer: Self-pay | Admitting: *Deleted

## 2018-01-27 ENCOUNTER — Other Ambulatory Visit: Payer: Self-pay | Admitting: *Deleted

## 2018-01-27 NOTE — Patient Outreach (Signed)
[place] [facility]) Care Management  01/27/2018  [name] 11-Sep-1950 [id number]   Phone call to patient today, per patient's request to provide resources for a wheelchair ramp and home modifications.  Patient did not answer. Voicemail message left requesting a return phone call.   Sheralyn Boatman [facility] Care Management [phone]

## 2018-01-28 ENCOUNTER — Other Ambulatory Visit: Payer: Self-pay | Admitting: *Deleted

## 2018-01-28 NOTE — Patient Outreach (Signed)
[facility]) Care Management  01/28/2018  [name] December 06, 1950 [id number]   Patient referred to this social worker to assist with resources for a wheelchair ramp and rails in the bathroom to avoid falls. Per patient, she uses a cain or a walker while ambulating due to being very unsteady on her feet.Patient's spouse, has been diagnosed with prostate cancer and may not be able to assist her with the stairs much longer.  Per patient, she owns her home and receives social security.   This Education officer, museum provided patient with the contact number for the [employer] [phone] a program that will assist with wheelchair ramps and rails in the bathroom.  Patient states that she will call today.  Plan:This Education officer, museum will follow patient regarding this referral within 2 weeks.   Sheralyn Boatman [facility] Care Management [phone]

## 2018-02-02 DIAGNOSIS — E1143 Type 2 diabetes mellitus with diabetic autonomic (poly)neuropathy: Secondary | ICD-10-CM | POA: Diagnosis not present

## 2018-02-02 DIAGNOSIS — L97521 Non-pressure chronic ulcer of other part of left foot limited to breakdown of skin: Secondary | ICD-10-CM | POA: Diagnosis not present

## 2018-02-03 ENCOUNTER — Other Ambulatory Visit: Payer: Self-pay | Admitting: Internal Medicine

## 2018-02-03 DIAGNOSIS — L309 Dermatitis, unspecified: Secondary | ICD-10-CM

## 2018-02-03 MED ORDER — CLOBETASOL PROPIONATE 0.05 % EX CREA: 1.0000 "application " | TOPICAL_CREAM | Freq: Two times a day (BID) | CUTANEOUS | 1 refills | Status: DC

## 2018-02-07 ENCOUNTER — Encounter: Payer: Self-pay | Admitting: *Deleted

## 2018-02-07 ENCOUNTER — Other Ambulatory Visit: Payer: Self-pay | Admitting: *Deleted

## 2018-02-07 NOTE — Patient Outreach (Signed)
[facility]) Care Management  02/07/2018  Bailyn Spackman Sep 20, 1950 [id number]   Phone call to patient to confirm that she has made contact with the [facility] for assistance with a wheelchair ramp for her and her husband. Per patient, she received the application on Saturday and just needs to complete and return it. Per patient, she will do this soon. Patient very appreciative of the resource provided and verbalized having no additional social work needs. This Education officer, museum will sign off, however encouraged patient to call this social worker if there are any future community resource needs.   Sheralyn Boatman [facility] Care Management [phone]

## 2018-02-07 NOTE — Telephone Encounter (Signed)
This encounter was created in error - please disregard.

## 2018-02-09 DIAGNOSIS — L97521 Non-pressure chronic ulcer of other part of left foot limited to breakdown of skin: Secondary | ICD-10-CM | POA: Diagnosis not present

## 2018-02-09 DIAGNOSIS — E1143 Type 2 diabetes mellitus with diabetic autonomic (poly)neuropathy: Secondary | ICD-10-CM | POA: Diagnosis not present

## 2018-02-15 DIAGNOSIS — E1143 Type 2 diabetes mellitus with diabetic autonomic (poly)neuropathy: Secondary | ICD-10-CM | POA: Diagnosis not present

## 2018-02-15 DIAGNOSIS — L97521 Non-pressure chronic ulcer of other part of left foot limited to breakdown of skin: Secondary | ICD-10-CM | POA: Diagnosis not present

## 2018-02-21 ENCOUNTER — Ambulatory Visit (INDEPENDENT_AMBULATORY_CARE_PROVIDER_SITE_OTHER): Payer: PPO | Admitting: Urology

## 2018-02-21 ENCOUNTER — Encounter: Payer: Self-pay | Admitting: Urology

## 2018-02-21 VITALS — BP 112/69 | HR 49 | Ht 65.0 in | Wt 252.0 lb

## 2018-02-21 DIAGNOSIS — N390 Urinary tract infection, site not specified: Secondary | ICD-10-CM

## 2018-02-21 DIAGNOSIS — N3946 Mixed incontinence: Secondary | ICD-10-CM | POA: Diagnosis not present

## 2018-02-21 LAB — URINALYSIS, COMPLETE
Bilirubin, UA: NEGATIVE
Glucose, UA: NEGATIVE
Ketones, UA: NEGATIVE
LEUKOCYTES UA: NEGATIVE
Nitrite, UA: NEGATIVE
PH UA: 5 (ref 5.0–7.5)
PROTEIN UA: NEGATIVE
RBC, UA: NEGATIVE
Specific Gravity, UA: 1.025 (ref 1.005–1.030)
Urobilinogen, Ur: 0.2 mg/dL (ref 0.2–1.0)

## 2018-02-21 LAB — MICROSCOPIC EXAMINATION
Epithelial Cells (non renal): NONE SEEN /hpf (ref 0–10)
RBC, UA: NONE SEEN /hpf (ref 0–2)
WBC UA: NONE SEEN /HPF (ref 0–5)

## 2018-02-21 NOTE — Progress Notes (Signed)
02/21/2018 9:30 AM   [name] November 07, 1950 [id number]  Referring provider: [name], [name], MD [place],  [id number]  Chief Complaint  Patient presents with  . Recurrent UTI    follow up    HPI: Patient recently saw our nurse practitioner and has been on Myrbetriq and oxybutynin ER 15 mg.  Patient has a past history of urinary tract infections and in May 2019 demonstrated a small stone nonobstructive left kidney.  Patient noted she did not want surgery and deferred physical therapy.  She was bothered by her incontinence.  The patient reports leaking with coughing and sneezing a significant amount.  She leaks when she goes from a sitting to standing position.  She has urge incontinence.  First thing in the morning she has significant foot on the floor syndrome.  I believe she has some mild bedwetting.  She wears 3 pads a day moderately wet.  When out in public the past can be soaked  She does not report prolapse symptoms but is failed the pessary.  She had a hysterectomy and bladder repair many years ago.  She has had a lithotripsies.  She uses a walker since she has lost her Achilles tendon on the left side due to complications from an infection over a year ago.  She is on oral hypoglycemics.  She thinks she gets 5 infections a year with pain with urination that respond favorably to antibiotics  Modifying factors: There are no other modifying factors  Associated signs and symptoms: There are no other associated signs and symptoms Aggravating and relieving factors: There are no other aggravating or relieving factors Severity: Moderate Duration: Persistent  On pelvic examination the patient had mild grade 2 hypermobility the bladder neck and a mild positive cough test.  She had no cystocele or appreciable rectocele.  She had significant obesity and a retropubic sling would not be ideal based upon her body habitus.   PMH: Past Medical History:    Diagnosis Date  . Abnormal antibody titer   . Anxiety and depression   . Arthritis   . Asthma   . Cystocele   . Depression   . Diabetes ([place])   . DVT (deep venous thrombosis) (HCC)    Righ calf  . Dysrhythmia   . Edema   . GERD (gastroesophageal reflux disease)   . Heart murmur    Echocardiogram June 2015: Mild MR (possible vegetation seen on TTE, not seen on TEE), normal LV size with moderate concentric LVH. Normal function EF 60-65%. Normal diastolic function. Mild LA dilation.  Marland Kitchen History of IBS   . History of methicillin resistant staphylococcus aureus (MRSA) 2008  . Hyperlipidemia   . Hypertension   . Hypothyroidism   . Insomnia   . Kidney stone    kidney stones with lithotripsy  . Neuropathy involving both lower extremities   . Obesity, Class II, BMI 35-39.9, with comorbidity   . Paroxysmal atrial fibrillation ([place]) 2007   In June 2015, Cardiac Event Monitor: Mostly SR/sinus arrhythmia with PVCs that are frequent. Short bursts of A. fib lasting several minutes;; CHA2DS2-VASc Score = 5 (age, Female, PVD, DM, HTN)  . Peripheral vascular disease ([place])   . Sleep apnea    use C-PAP  . Urinary incontinence   . Venous stasis dermatitis of both lower extremities     Surgical History: Past Surgical History:  Procedure Laterality Date  . ANKLE SURGERY Right   . APPLICATION OF WOUND VAC Left 06/27/2015  Procedure: APPLICATION OF WOUND VAC ( POSSIBLE ) ;  Surgeon: [name], MD;  Location: [facility] ORS;  Service: Vascular;  Laterality: Left;  . APPLICATION OF WOUND VAC Left 09/25/2016   Procedure: APPLICATION OF WOUND VAC;  Surgeon: [name], DPM;  Location: [facility] ORS;  Service: Podiatry;  Laterality: Left;  . arm surgery     right    . CHOLECYSTECTOMY    . COLONOSCOPY    . COLONOSCOPY WITH PROPOFOL N/A 01/11/2017   Procedure: COLONOSCOPY WITH PROPOFOL;  Surgeon: [name], MD;  Location: [facility] ENDOSCOPY;  Service: Endoscopy;  Laterality: N/A;  . COLONOSCOPY WITH  PROPOFOL N/A 02/22/2017   Procedure: COLONOSCOPY WITH PROPOFOL;  Surgeon: [name], MD;  Location: [facility] ENDOSCOPY;  Service: Endoscopy;  Laterality: N/A;  . COLONOSCOPY WITH PROPOFOL N/A 05/31/2017   Procedure: COLONOSCOPY WITH PROPOFOL;  Surgeon: [name], MD;  Location: [facility] ENDOSCOPY;  Service: Endoscopy;  Laterality: N/A;  . ESOPHAGOGASTRODUODENOSCOPY (EGD) WITH PROPOFOL N/A 01/11/2017   Procedure: ESOPHAGOGASTRODUODENOSCOPY (EGD) WITH PROPOFOL;  Surgeon: [name], MD;  Location: [facility] ENDOSCOPY;  Service: Endoscopy;  Laterality: N/A;  . HERNIA REPAIR     umbilical  . HIATAL HERNIA REPAIR    . I&D EXTREMITY Left 06/27/2015   Procedure: IRRIGATION AND DEBRIDEMENT EXTREMITY            ( CALF HEMATOMA ) POSSIBLE WOUND VAC;  Surgeon: [name], MD;  Location: [facility] ORS;  Service: Vascular;  Laterality: Left;  . INCISION AND DRAINAGE OF WOUND Left 09/25/2016   Procedure: IRRIGATION AND DEBRIDEMENT - PARTIAL RESECTION OF ACHILLES TENDON WITH WOUND VAC APPLICATION;  Surgeon: [name], DPM;  Location: [facility] ORS;  Service: Podiatry;  Laterality: Left;  . IRRIGATION AND DEBRIDEMENT ABSCESS Left 09/07/2016   Procedure: IRRIGATION AND DEBRIDEMENT ABSCESS LEFT HEEL;  Surgeon: [name], DPM;  Location: [facility] ORS;  Service: Podiatry;  Laterality: Left;  . JOINT REPLACEMENT  2013   left knee replacement  . KIDNEY SURGERY Right    kidney stones  . LITHOTRIPSY    . NM [facility] HX)  February 2017   Likely breast attenuation. LOW RISK study. Normal EF 55-60%.  . OTHER SURGICAL HISTORY     08/2016 or 09/2016 surgery on achilles tenso h/o staph infection heal. Dr. [name]  . removal of left hematoma Left    leg  . REVERSE SHOULDER ARTHROPLASTY Right 10/08/2015   Procedure: REVERSE SHOULDER ARTHROPLASTY;  Surgeon: [name], MD;  Location: [facility] ORS;  Service: Orthopedics;  Laterality: Right;  . SLEEVE GASTROPLASTY    . TONSILLECTOMY    . TOTAL KNEE  ARTHROPLASTY    . TOTAL SHOULDER REPLACEMENT Right   . TRANSESOPHAGEAL ECHOCARDIOGRAM  08/08/2013   Mild LVH, EF 60-65%. Moderate LA dilation and mild RA dilation. Mild MR with no evidence of stenosis and no evidence of endocarditis. A false chordae is noted.  . TRANSTHORACIC ECHOCARDIOGRAM  08/03/2013   Mild-Moderate concentric LVH, EF 60-65%. Normal diastolic function. Mild LA dilation. Mild MR with possible vegetation  - not confirmed on TEE   . ULNAR NERVE TRANSPOSITION Right 08/06/2016   Procedure: ULNAR NERVE DECOMPRESSION/TRANSPOSITION;  Surgeon: [name], MD;  Location: [facility] ORS;  Service: Orthopedics;  Laterality: Right;  . UPPER GI ENDOSCOPY    . VAGINAL HYSTERECTOMY      Home Medications:  Allergies as of 02/21/2018      Reactions   Penicillins Hives, Shortness Of Breath, Swelling  Facial swelling Has patient had a PCN reaction causing immediate rash, facial/tongue/throat swelling, SOB or lightheadedness with hypotension: Yes Has patient had a PCN reaction causing severe rash involving mucus membranes or skin necrosis: Yes Has patient had a PCN reaction that required hospitalization No Has patient had a PCN reaction occurring within the last 10 years: No If all of the above answers are "NO", then may proceed with Cephalosporin use.   Aspirin Hives   Morphine And Related Other (See Comments)   Patient becomes very confused   Terramycin [oxytetracycline] Hives      Medication List        Accurate as of 02/21/18  9:30 AM. Always use your most recent med list.          albuterol (2.5 MG/3ML) 0.083% nebulizer solution Commonly known as:  PROVENTIL Take 3 mLs (2.5 mg total) by nebulization every 6 (six) hours as needed for wheezing or shortness of breath.   albuterol 108 (90 Base) MCG/ACT inhaler Commonly known as:  PROVENTIL HFA;VENTOLIN HFA Inhale 1-2 puffs into the lungs every 4 (four) hours as needed for wheezing or shortness of breath.   ALPRAZolam 0.5 MG  tablet Commonly known as:  XANAX Take 1 tablet (0.5 mg total) by mouth 2 (two) times daily as needed for anxiety.   amLODipine 5 MG tablet Commonly known as:  NORVASC Take 1 tablet (5 mg total) by mouth daily.   benzonatate 200 MG capsule Commonly known as:  TESSALON Take 1 capsule (200 mg total) by mouth 3 (three) times daily as needed for cough.   buPROPion 300 MG 24 hr tablet Commonly known as:  WELLBUTRIN XL Take 1 tablet (300 mg total) by mouth daily.   clobetasol cream 0.05 % Commonly known as:  TEMOVATE Apply 1 application topically 2 (two) times daily. To arms   cyclobenzaprine 10 MG tablet Commonly known as:  FLEXERIL Take 10 mg by mouth as needed.   dabigatran 150 MG Caps capsule Commonly known as:  PRADAXA Take 1 capsule (150 mg total) by mouth 2 (two) times daily.   dexlansoprazole 60 MG capsule Commonly known as:  DEXILANT Take 1 capsule (60 mg total) by mouth daily. Minutes before meal (lunch)   doxycycline 100 MG tablet Commonly known as:  VIBRA-TABS Take 1 tablet (100 mg total) by mouth 2 (two) times daily.   DULoxetine 60 MG capsule Commonly known as:  CYMBALTA Take 1 capsule (60 mg total) by mouth daily.   estradiol 0.1 MG/GM vaginal cream Commonly known as:  ESTRACE Apply 0.5mg  (pea-sized amount)  just inside the vaginal introitus with a finger-tip on Monday, Wednesday and Friday nights,   ferrous sulfate 325 (65 FE) MG tablet Take 325 mg by mouth daily with breakfast.   fluconazole 150 MG tablet Commonly known as:  DIFLUCAN Take 150 mg by mouth as needed.   Fluticasone-Salmeterol 250-50 MCG/DOSE Aepb Commonly known as:  ADVAIR Inhale 1 puff into the lungs 2 (two) times daily.   gabapentin 800 MG tablet Commonly known as:  NEURONTIN Take 1 tablet (800 mg total) by mouth 4 (four) times daily.   hydrochlorothiazide 12.5 MG tablet Commonly known as:  HYDRODIURIL Take 1 tablet (12.5 mg total) by mouth daily. In am     HYDROcodone-acetaminophen 5-325 MG tablet Commonly known as:  NORCO/VICODIN Take 1 tablet by mouth every 8 (eight) hours as needed for severe pain.   ipratropium-albuterol 0.5-2.5 (3) MG/3ML Soln Commonly known as:  DUONEB Take 3 mLs by nebulization  3 (three) times daily as needed.   levocetirizine 5 MG tablet Commonly known as:  XYZAL TAKE 1 TABLET BY MOUTH EVERY EVENING   levothyroxine 175 MCG tablet Commonly known as:  SYNTHROID, LEVOTHROID Take 1 tablet (175 mcg total) by mouth daily before breakfast.   meloxicam 15 MG tablet Commonly known as:  MOBIC TAKE ONE TABLET BY MOUTH EVERY DAY WITH A MEAL   metFORMIN 1000 MG tablet Commonly known as:  GLUCOPHAGE TAKE ONE TABLET BY MOUTH TWICE DAILY   metoprolol succinate 100 MG 24 hr tablet Commonly known as:  TOPROL-XL Take 1 tablet (100 mg total) by mouth daily. Take with or immediately following a meal.   mirabegron ER 50 MG Tb24 tablet Commonly known as:  MYRBETRIQ Take 1 tablet (50 mg total) by mouth daily.   montelukast 10 MG tablet Commonly known as:  SINGULAIR TAKE ONE TABLET BY MOUTH EVERY DAY   multivitamin with minerals Tabs tablet Take 1 tablet by mouth daily.   mupirocin ointment 2 % Commonly known as:  BACTROBAN Apply 1 application topically 2 (two) times daily. Skin wounds   nystatin cream Commonly known as:  MYCOSTATIN Apply 1 application topically 2 (two) times daily. Mouth prn for 7 days as needed   oxybutynin 15 MG 24 hr tablet Commonly known as:  DITROPAN XL TAKE ONE TABLET BY MOUTH AT BEDTIME   quinapril 40 MG tablet Commonly known as:  ACCUPRIL Take 1 tablet (40 mg total) by mouth daily.   Tiotropium Bromide Monohydrate 1.25 MCG/ACT Aers Inhale 2 puffs into the lungs daily.   vitamin B-12 500 MCG tablet Commonly known as:  CYANOCOBALAMIN Take 500 mcg by mouth daily.   VOLTAREN 1 % Gel Generic drug:  diclofenac sodium Apply 1 application topically as needed.   zinc oxide 20 %  ointment Apply 1 application topically as needed for irritation.       Allergies:  Allergies  Allergen Reactions  . Penicillins Hives, Shortness Of Breath and Swelling    Facial swelling Has patient had a PCN reaction causing immediate rash, facial/tongue/throat swelling, SOB or lightheadedness with hypotension: Yes Has patient had a PCN reaction causing severe rash involving mucus membranes or skin necrosis: Yes Has patient had a PCN reaction that required hospitalization No Has patient had a PCN reaction occurring within the last 10 years: No If all of the above answers are "NO", then may proceed with Cephalosporin use.   . Aspirin Hives  . Morphine And Related Other (See Comments)    Patient becomes very confused  . Terramycin [Oxytetracycline] Hives    Family History: Family History  Problem Relation Age of Onset  . Skin cancer Father   . Diabetes Father   . Hypertension Father   . Peripheral vascular disease Father   . Cancer Father   . Cerebral aneurysm Father   . Alcohol abuse Father   . Varicose Veins Mother   . Kidney disease Mother   . Arthritis Mother   . Mental illness Sister   . Cancer Maternal Aunt        breast  . Breast cancer Maternal Aunt   . Arthritis Maternal Grandmother   . Hypertension Maternal Grandmother   . Diabetes Maternal Grandmother   . Arthritis Maternal Grandfather   . Heart disease Maternal Grandfather   . Hypertension Maternal Grandfather   . Arthritis Paternal Grandmother   . Hypertension Paternal Grandmother   . Diabetes Paternal Grandmother   . Arthritis Paternal Grandfather   .  Hypertension Paternal Grandfather   . Bladder Cancer Neg Hx   . Kidney cancer Neg Hx     Social History:  reports that she has never smoked. She has never used smokeless tobacco. She reports that she does not drink alcohol or use drugs.  ROS: UROLOGY Frequent Urination?: Yes Hard to postpone urination?: Yes Burning/pain with urination?: No Get  up at night to urinate?: Yes Leakage of urine?: Yes Urine stream starts and stops?: No Trouble starting stream?: No Do you have to strain to urinate?: Yes Blood in urine?: No Urinary tract infection?: No Sexually transmitted disease?: No Injury to kidneys or bladder?: No Painful intercourse?: No Weak stream?: No Currently pregnant?: No Vaginal bleeding?: No Last menstrual period?: n  Gastrointestinal Nausea?: No Vomiting?: No Indigestion/heartburn?: Yes Diarrhea?: No Constipation?: No  Constitutional Fever: No Night sweats?: No Weight loss?: No Fatigue?: No  Skin Skin rash/lesions?: No Itching?: No  Eyes Blurred vision?: No Double vision?: No  Ears/Nose/Throat Sore throat?: No Sinus problems?: Yes  Hematologic/Lymphatic Swollen glands?: No Easy bruising?: Yes  Cardiovascular Leg swelling?: Yes Chest pain?: No  Respiratory Cough?: Yes Shortness of breath?: No  Endocrine Excessive thirst?: No  Musculoskeletal Back pain?: No Joint pain?: Yes  Neurological Headaches?: No Dizziness?: Yes  Psychologic Depression?: No Anxiety?: Yes  Physical Exam: BP 112/69   Pulse (!) 49   Ht 5\' 5"  (1.651 m)   Wt 114.3 kg   BMI 41.93 kg/m   Constitutional:  Alert and oriented, No acute distress.   Laboratory Data: Lab Results  Component Value Date   WBC 5.6 05/10/2017   HGB 10.9 (L) 05/10/2017   HCT 33.9 (L) 05/10/2017   MCV 85.0 05/10/2017   PLT 271.0 05/10/2017    Lab Results  Component Value Date   CREATININE 0.82 06/24/2017    No results found for: PSA  No results found for: TESTOSTERONE  Lab Results  Component Value Date   HGBA1C 6.1 (A) 11/17/2017    Urinalysis    Component Value Date/Time   COLORURINE YELLOW 08/02/2017 1427   APPEARANCEUR Clear 01/18/2018 1442   LABSPEC >=1.030 (A) 08/02/2017 1427   PHURINE 5.5 08/02/2017 1427   GLUCOSEU Negative 01/18/2018 1442   GLUCOSEU NEGATIVE 08/02/2017 1427   HGBUR MODERATE (A)  08/02/2017 1427   BILIRUBINUR Negative 01/18/2018 1442   BILIRUBINUR 1+ 09/15/2017 1319   KETONESUR TRACE (A) 08/02/2017 1427   PROTEINUR Negative 01/18/2018 1442   PROTEINUR NEGATIVE 09/05/2016 0933   UROBILINOGEN 0.2 09/15/2017 1319   UROBILINOGEN 0.2 08/02/2017 1427   NITRITE Negative 01/18/2018 1442   NITRITE NEGATIVE 08/02/2017 1427   LEUKOCYTESUR Negative 01/18/2018 1442    Pertinent Imaging:   Assessment & Plan: Patient has mixed incontinence.  She has medical comorbidities and has lost over 110 pounds.  She has failed 2 medications.  She gets recurrent bladder infections.  The role of urodynamics discussed and ordered.  If her stress incontinence was ever treated I would recommend injectable based upon medical comorbidities and obesity and pelvic examination.  Her obesity may make the threat of catheterization more challenging     1. Recurrent UTI  - Urinalysis, Complete - CULTURE, URINE COMPREHENSIVE   No follow-ups on file.  Reece Packer, MD  [facility] [address] [place], [place] [address] [phone]

## 2018-02-22 ENCOUNTER — Encounter: Payer: Self-pay | Admitting: Nurse Practitioner

## 2018-02-22 ENCOUNTER — Other Ambulatory Visit: Payer: Self-pay

## 2018-02-22 ENCOUNTER — Other Ambulatory Visit: Payer: Self-pay | Admitting: Internal Medicine

## 2018-02-22 ENCOUNTER — Ambulatory Visit: Payer: PPO | Attending: Nurse Practitioner | Admitting: Nurse Practitioner

## 2018-02-22 VITALS — BP 138/57 | HR 52 | Temp 97.7°F | Resp 16 | Ht 65.0 in | Wt 252.0 lb

## 2018-02-22 DIAGNOSIS — I10 Essential (primary) hypertension: Secondary | ICD-10-CM | POA: Diagnosis not present

## 2018-02-22 DIAGNOSIS — Z8261 Family history of arthritis: Secondary | ICD-10-CM | POA: Insufficient documentation

## 2018-02-22 DIAGNOSIS — E785 Hyperlipidemia, unspecified: Secondary | ICD-10-CM | POA: Diagnosis not present

## 2018-02-22 DIAGNOSIS — Z7984 Long term (current) use of oral hypoglycemic drugs: Secondary | ICD-10-CM | POA: Diagnosis not present

## 2018-02-22 DIAGNOSIS — F418 Other specified anxiety disorders: Secondary | ICD-10-CM | POA: Diagnosis not present

## 2018-02-22 DIAGNOSIS — E039 Hypothyroidism, unspecified: Secondary | ICD-10-CM | POA: Diagnosis not present

## 2018-02-22 DIAGNOSIS — Z86718 Personal history of other venous thrombosis and embolism: Secondary | ICD-10-CM | POA: Diagnosis not present

## 2018-02-22 DIAGNOSIS — Z811 Family history of alcohol abuse and dependence: Secondary | ICD-10-CM | POA: Diagnosis not present

## 2018-02-22 DIAGNOSIS — E1151 Type 2 diabetes mellitus with diabetic peripheral angiopathy without gangrene: Secondary | ICD-10-CM | POA: Diagnosis not present

## 2018-02-22 DIAGNOSIS — M79604 Pain in right leg: Secondary | ICD-10-CM | POA: Diagnosis present

## 2018-02-22 DIAGNOSIS — Z79899 Other long term (current) drug therapy: Secondary | ICD-10-CM | POA: Diagnosis not present

## 2018-02-22 DIAGNOSIS — Z880 Allergy status to penicillin: Secondary | ICD-10-CM | POA: Insufficient documentation

## 2018-02-22 DIAGNOSIS — I878 Other specified disorders of veins: Secondary | ICD-10-CM | POA: Diagnosis not present

## 2018-02-22 DIAGNOSIS — Z87442 Personal history of urinary calculi: Secondary | ICD-10-CM | POA: Diagnosis not present

## 2018-02-22 DIAGNOSIS — Z7901 Long term (current) use of anticoagulants: Secondary | ICD-10-CM | POA: Diagnosis not present

## 2018-02-22 DIAGNOSIS — Z79890 Hormone replacement therapy (postmenopausal): Secondary | ICD-10-CM | POA: Insufficient documentation

## 2018-02-22 DIAGNOSIS — E1351 Other specified diabetes mellitus with diabetic peripheral angiopathy without gangrene: Secondary | ICD-10-CM | POA: Diagnosis not present

## 2018-02-22 DIAGNOSIS — I480 Paroxysmal atrial fibrillation: Secondary | ICD-10-CM | POA: Insufficient documentation

## 2018-02-22 DIAGNOSIS — G894 Chronic pain syndrome: Secondary | ICD-10-CM | POA: Diagnosis not present

## 2018-02-22 DIAGNOSIS — K219 Gastro-esophageal reflux disease without esophagitis: Secondary | ICD-10-CM | POA: Insufficient documentation

## 2018-02-22 DIAGNOSIS — Z818 Family history of other mental and behavioral disorders: Secondary | ICD-10-CM | POA: Insufficient documentation

## 2018-02-22 DIAGNOSIS — Z6841 Body Mass Index (BMI) 40.0 and over, adult: Secondary | ICD-10-CM | POA: Insufficient documentation

## 2018-02-22 DIAGNOSIS — Z8249 Family history of ischemic heart disease and other diseases of the circulatory system: Secondary | ICD-10-CM | POA: Insufficient documentation

## 2018-02-22 DIAGNOSIS — G4700 Insomnia, unspecified: Secondary | ICD-10-CM | POA: Diagnosis not present

## 2018-02-22 DIAGNOSIS — E1142 Type 2 diabetes mellitus with diabetic polyneuropathy: Secondary | ICD-10-CM | POA: Insufficient documentation

## 2018-02-22 DIAGNOSIS — Z841 Family history of disorders of kidney and ureter: Secondary | ICD-10-CM | POA: Insufficient documentation

## 2018-02-22 DIAGNOSIS — Z79891 Long term (current) use of opiate analgesic: Secondary | ICD-10-CM | POA: Insufficient documentation

## 2018-02-22 DIAGNOSIS — K589 Irritable bowel syndrome without diarrhea: Secondary | ICD-10-CM | POA: Diagnosis not present

## 2018-02-22 DIAGNOSIS — Z7951 Long term (current) use of inhaled steroids: Secondary | ICD-10-CM | POA: Insufficient documentation

## 2018-02-22 DIAGNOSIS — Z96611 Presence of right artificial shoulder joint: Secondary | ICD-10-CM | POA: Insufficient documentation

## 2018-02-22 DIAGNOSIS — J4540 Moderate persistent asthma, uncomplicated: Secondary | ICD-10-CM | POA: Insufficient documentation

## 2018-02-22 DIAGNOSIS — Z881 Allergy status to other antibiotic agents status: Secondary | ICD-10-CM | POA: Insufficient documentation

## 2018-02-22 DIAGNOSIS — G4733 Obstructive sleep apnea (adult) (pediatric): Secondary | ICD-10-CM | POA: Insufficient documentation

## 2018-02-22 DIAGNOSIS — Z8614 Personal history of Methicillin resistant Staphylococcus aureus infection: Secondary | ICD-10-CM | POA: Insufficient documentation

## 2018-02-22 DIAGNOSIS — Z885 Allergy status to narcotic agent status: Secondary | ICD-10-CM | POA: Insufficient documentation

## 2018-02-22 DIAGNOSIS — Z833 Family history of diabetes mellitus: Secondary | ICD-10-CM | POA: Insufficient documentation

## 2018-02-22 DIAGNOSIS — K760 Fatty (change of) liver, not elsewhere classified: Secondary | ICD-10-CM | POA: Insufficient documentation

## 2018-02-22 DIAGNOSIS — Z886 Allergy status to analgesic agent status: Secondary | ICD-10-CM | POA: Insufficient documentation

## 2018-02-22 MED ORDER — DULOXETINE HCL 60 MG PO CPEP: 60.0000 mg | ORAL_CAPSULE | Freq: Every day | ORAL | 0 refills | Status: DC

## 2018-02-22 MED ORDER — GABAPENTIN 800 MG PO TABS: 800.0000 mg | ORAL_TABLET | Freq: Four times a day (QID) | ORAL | 0 refills | Status: DC

## 2018-02-22 MED ORDER — MONTELUKAST SODIUM 10 MG PO TABS: 10.0000 mg | ORAL_TABLET | Freq: Every day | ORAL | 3 refills | Status: DC

## 2018-02-22 MED ORDER — HYDROCODONE-ACETAMINOPHEN 5-325 MG PO TABS: 1.0000 | ORAL_TABLET | Freq: Three times a day (TID) | ORAL | 0 refills | Status: DC | PRN

## 2018-02-22 NOTE — Progress Notes (Signed)
Nursing Pain Medication Assessment:  Safety precautions to be maintained throughout the outpatient stay will include: orient to surroundings, keep bed in low position, maintain call bell within reach at all times, provide assistance with transfer out of bed and ambulation.  Medication Inspection Compliance: Pill count conducted under aseptic conditions, in front of the patient. Neither the pills nor the bottle was removed from the patient's sight at any time. Once count was completed pills were immediately returned to the patient in their original bottle.  Medication: Hydrocodone/APAP Pill/Patch Count: 47 of 90 pills remain Pill/Patch Appearance: Markings consistent with prescribed medication Bottle Appearance: Standard pharmacy container. Clearly labeled. Filled Date: 83 / 18 / 2019 Last Medication intake:  Today

## 2018-02-22 NOTE — Progress Notes (Signed)
Patient's Name: [name]  MRN: [id number]  Referring Provider: [name], Tracy *  DOB: 04/16/1950  PCP: [name], Tracy N, MD  DOS: 02/22/2018  Note by:  M.  NP  Service setting: Ambulatory outpatient  Specialty: Interventional Pain Management  Location: [facility] (AMB) Pain Management Facility    Patient type: Established    Primary Reason(s) for Visit: Encounter for prescription drug management. (Level of risk: moderate)  CC: Leg Pain (bilateral) and Foot Pain (bilateral)  HPI  [name] is a 67 y.o. year old, female patient, who comes today for a medication management evaluation. She has Paroxysmal atrial fibrillation (HCC); CHA2DS2-Vasc Score 5. On Pradaxa; Essential hypertension; Peripheral vascular disease due to secondary diabetes mellitus (HCC); Apnea, sleep; DM (diabetes mellitus) type II, controlled, with peripheral vascular disorder (HCC); Diabetic peripheral neuropathy associated with type 2 diabetes mellitus (HCC); Asthma, moderate persistent, well-controlled; Hypothyroidism, adult; Anxiety and depression; Bladder cystocele; Gastro-esophageal reflux disease without esophagitis; Mixed incontinence; Atrophic vaginitis; Opiate use (15 MME/Day); Other shoulder lesions (Right); Neuropathy of right lateral femoral cutaneous nerve; Morbid obesity with BMI of 40.0-44.9, adult (HCC); Chronic prescription benzodiazepine use; Osteoarthritis, multiple sites; Status post reverse total shoulder replacement; Chronic pain syndrome; Long term current use of opiate analgesic; Chronic right shoulder pain; Abscess of tendon of left foot; Anemia; Asthma; Carpal tunnel syndrome; Cervical radiculitis; Cubital tunnel syndrome on right; Impingement syndrome of shoulder region; Pain in joint involving ankle and foot; Obstructive sleep apnea of adult; Spinal stenosis of thoracic region; Fall with injury; Pain of left hip joint; Abnormality of gait and mobility; Mass of both adrenal glands  (HCC); Fatty liver; Diabetic ulcer of left foot (HCC); Eczema; Adrenal mass (HCC); Chronic atrial fibrillation; Muscle strain of left hip; Strain of right hip; and Ischial pain, right on their problem list. Her primarily concern today is the Leg Pain (bilateral) and Foot Pain (bilateral)  Pain Assessment: Location: Right, Left Leg Radiating:   Onset: More than a month ago Duration: Chronic pain Quality: Heaviness, Burning(Stinging) Severity: 6 /10 (subjective, self-reported pain score)  Note: Reported level is compatible with observation. Clinically the patient looks like a 2/10 A 2/10 is viewed as "Mild to Moderate" and described as noticeable and distracting. Impossible to hide from other people. More frequent flare-ups. Still possible to adapt and function close to normal. It can be very annoying and may have occasional stronger flare-ups. With discipline, patients may get used to it and adapt.       When using our objective Pain Scale, levels between 6 and 10/10 are said to belong in an emergency room, as it progressively worsens from a 6/10, described as severely limiting, requiring emergency care not usually available at an outpatient pain management facility. At a 6/10 level, communication becomes difficult and requires great effort. Assistance to reach the emergency department may be required. Facial flushing and profuse sweating along with potentially dangerous increases in heart rate and blood pressure will be evident. Effect on ADL: prolonged walking, standing Timing: Constant Modifying factors: medications, rest, cymbalta BP: (!) 138/57  HR: (!) 52  [name] was last scheduled for an appointment on 11/24/2017 for medication management. During today's appointment we reviewed [name]'s chronic pain status, as well as her outpatient medication regimen. She admits that she has had 7 skin grafts to her right foot over the last 2 months. She admits that the PN in the foot is worse. She  feels like she has not right heel pain. She denies any new concerns today.     The patient  reports that she does not use drugs. Her body mass index is 41.93 kg/m.  Further details on both, my assessment(s), as well as the proposed treatment plan, please see below.  Controlled Substance Pharmacotherapy Assessment REMS (Risk Evaluation and Mitigation Strategy)  Analgesic:Hydrocodone/APAP 5/325 one tablet every 8 hours (15 mg/day) MME/day:15 mg/day  Garner, Cynthia F, RN  02/22/2018  1:52 PM  Sign at close encounter Nursing Pain Medication Assessment:  Safety precautions to be maintained throughout the outpatient stay will include: orient to surroundings, keep bed in low position, maintain call bell within reach at all times, provide assistance with transfer out of bed and ambulation.  Medication Inspection Compliance: Pill count conducted under aseptic conditions, in front of the patient. Neither the pills nor the bottle was removed from the patient's sight at any time. Once count was completed pills were immediately returned to the patient in their original bottle.  Medication: Hydrocodone/APAP Pill/Patch Count: 47 of 90 pills remain Pill/Patch Appearance: Markings consistent with prescribed medication Bottle Appearance: Standard pharmacy container. Clearly labeled. Filled Date: 11 / 18 / 2019 Last Medication intake:  Today   Pharmacokinetics: Liberation and absorption (onset of action): WNL Distribution (time to peak effect): WNL Metabolism and excretion (duration of action): WNL         Pharmacodynamics: Desired effects: Analgesia: [name] reports >50% benefit. Functional ability: Patient reports that medication allows her to accomplish basic ADLs Clinically meaningful improvement in function (CMIF): Sustained CMIF goals met Perceived effectiveness: Described as relatively effective, allowing for increase in activities of daily living (ADL) Undesirable effects: Side-effects or  Adverse reactions: None reported Monitoring: [place] PMP: Online review of the past 12-month period conducted. Compliant with practice rules and regulations Last UDS on record: Summary  Date Value Ref Range Status  08/24/2017 FINAL  Final    Comment:    ==================================================================== TOXASSURE SELECT 13 (MW) ==================================================================== Test                             Result       Flag       Units Drug Present and Declared for Prescription Verification   Alprazolam                     71           EXPECTED   ng/mg creat   Alpha-hydroxyalprazolam        114          EXPECTED   ng/mg creat    Source of alprazolam is a scheduled prescription medication.    Alpha-hydroxyalprazolam is an expected metabolite of alprazolam.   Hydrocodone                    119          EXPECTED   ng/mg creat   Norhydrocodone                 175          EXPECTED   ng/mg creat    Sources of hydrocodone include scheduled prescription    medications. Norhydrocodone is an expected metabolite of    hydrocodone. ==================================================================== Test                      Result    Flag   Units      Ref Range   Creatinine                63               mg/dL      >=20 ==================================================================== Declared Medications:  The flagging and interpretation on this report are based on the  following declared medications.  Unexpected results may arise from  inaccuracies in the declared medications.  **Note: The testing scope of this panel includes these medications:  Alprazolam  Hydrocodone (Hydrocodone-Acetaminophen)  **Note: The testing scope of this panel does not include following  reported medications:  Acetaminophen (Hydrocodone-Acetaminophen)  Albuterol  Amlodipine  Bupropion  Clobetasol  Cyanocobalamin  Dabigatran (Pradaxa)  Dexlansoprazole  Duloxetine   Estradiol  Ferrous Gluconate  Fluconazole  Fluticasone (Advair)  Gabapentin  Iron (Ferrous Sulfate)  Levocetirizine  Levofloxacin  Levothyroxine  Metformin  Metoprolol  Mirabegron  Montelukast  Multivitamin  Ondansetron  Oxybutynin  Potassium  Quinapril  Salmeterol (Advair)  Topical  Triamcinolone ==================================================================== For clinical consultation, please call [phone]. ====================================================================    UDS interpretation: Compliant          Medication Assessment Form: Reviewed. Patient indicates being compliant with therapy Treatment compliance: Compliant Risk Assessment Profile: Aberrant behavior: See prior evaluations. None observed or detected today Comorbid factors increasing risk of overdose: See prior notes. No additional risks detected today Opioid risk tool (ORT) (Total Score): 0 Personal History of Substance Abuse (SUD-Substance use disorder):  Alcohol: Negative  Illegal Drugs: Negative  Rx Drugs: Negative  ORT Risk Level calculation: Low Risk Risk of substance use disorder (SUD): Low Opioid Risk Tool - 02/22/18 1345      Personal History of Substance Abuse   Alcohol  Negative    Illegal Drugs  Negative    Rx Drugs  Negative      Age   Age between 57-45 years   No      History of Preadolescent Sexual Abuse   History of Preadolescent Sexual Abuse  Negative or Female      Psychological Disease   Psychological Disease  Negative    Depression  Negative      Total Score   Opioid Risk Tool Scoring  0    Opioid Risk Interpretation  Low Risk      ORT Scoring interpretation table:  Score <3 = Low Risk for SUD  Score between 4-7 = Moderate Risk for SUD  Score >8 = High Risk for Opioid Abuse   Risk Mitigation Strategies:  Patient Counseling: Covered Patient-Prescriber Agreement (PPA): Present and active  Notification to other healthcare providers:  Done  Pharmacologic Plan: No change in therapy, at this time.             Laboratory Chemistry  Inflammation Markers (CRP: Acute Phase) (ESR: Chronic Phase) Lab Results  Component Value Date   CRP 14.7 (H) 09/08/2016   ESRSEDRATE 58 (H) 09/08/2016                         Rheumatology Markers No results found for: RF, ANA, LABURIC, URICUR, LYMEIGGIGMAB, LYMEABIGMQN, HLAB27                      Renal Function Markers Lab Results  Component Value Date   BUN 20 06/24/2017   CREATININE 0.82 06/24/2017   BCR 20 10/10/2014   GFRAA >60 01/07/2017   GFRNONAA >60 01/07/2017                             Hepatic  Function Markers Lab Results  Component Value Date   AST 12 05/10/2017   ALT 9 05/10/2017   ALBUMIN 3.9 05/10/2017   ALKPHOS 64 05/10/2017   LIPASE 120 08/12/2012                        Electrolytes Lab Results  Component Value Date   NA 141 06/24/2017   K 4.5 06/24/2017   CL 104 06/24/2017   CALCIUM 9.1 06/24/2017   MG 1.8 04/17/2015   PHOS 3.4 08/12/2012                        Neuropathy Markers Lab Results  Component Value Date   VITAMINB12 502 05/10/2017   HGBA1C 6.1 (A) 11/17/2017                        CNS Tests No results found for: COLORCSF, APPEARCSF, RBCCOUNTCSF, WBCCSF, POLYSCSF, LYMPHSCSF, EOSCSF, PROTEINCSF, GLUCCSF, JCVIRUS, CSFOLI, IGGCSF                      Bone Pathology Markers Lab Results  Component Value Date   VD25OH 32.15 05/10/2017                         Coagulation Parameters Lab Results  Component Value Date   INR 1.18 09/26/2015   LABPROT 15.2 (H) 09/26/2015   APTT 42 (H) 06/24/2015   PLT 271.0 05/10/2017                        Cardiovascular Markers Lab Results  Component Value Date   BNP 216.0 (H) 09/30/2016   TROPONINI <0.03 09/30/2016   HGB 10.9 (L) 05/10/2017   HCT 33.9 (L) 05/10/2017                         CA Markers No results found for: CEA, CA125, LABCA2                      Note: Lab results  reviewed.  Recent Diagnostic Imaging Results  US RENAL CLINICAL DATA:  67 year old female with recurrent urinary tract infections.  EXAM: RENAL / URINARY TRACT ULTRASOUND COMPLETE  COMPARISON:  CT Abdomen and Pelvis 08/12/2017 and earlier.  FINDINGS: Right Kidney:  Length: 11.2 centimeters. Nodular right renal contour re-demonstrated. Some cortical volume loss suspected. Echogenicity within normal limits. No mass or hydronephrosis visualized.  Left Kidney:  Length: 10.8 centimeters. Nodular contour re-demonstrated with some cortical volume loss suspected. Left midpole nephrolithiasis re-demonstrated on image 24. Echogenicity within normal limits. No mass or hydronephrosis visualized.  Bladder:  Diminutive bladder. Appears normal for degree of bladder distention.  IMPRESSION: 1. No acute renal or urinary bladder findings. 2. Chronic left nephrolithiasis, and a degree of chronic renal cortical atrophy is suspected.  Electronically Signed   By: Genevie Ann M.D.   On: 01/13/2018 17:41  Complexity Note: Imaging results reviewed. Results shared with [name], using Layman's terms.                         Meds   Current Outpatient Medications:  .  albuterol (PROVENTIL HFA;VENTOLIN HFA) 108 (90 Base) MCG/ACT inhaler, Inhale 1-2 puffs into the lungs every 4 (four) hours as needed for wheezing or shortness of breath. (Patient taking  differently: Inhale 2 puffs into the lungs every 4 (four) hours as needed for wheezing or shortness of breath. ), Disp: 8 g, Rfl: 2 .  albuterol (PROVENTIL) (2.5 MG/3ML) 0.083% nebulizer solution, Take 3 mLs (2.5 mg total) by nebulization every 6 (six) hours as needed for wheezing or shortness of breath., Disp: 150 mL, Rfl: 1 .  ALPRAZolam (XANAX) 0.5 MG tablet, Take 1 tablet (0.5 mg total) by mouth 2 (two) times daily as needed for anxiety., Disp: 60 tablet, Rfl: 2 .  amLODipine (NORVASC) 5 MG tablet, Take 1 tablet (5 mg total) by mouth daily., Disp:  90 tablet, Rfl: 3 .  benzonatate (TESSALON) 200 MG capsule, Take 1 capsule (200 mg total) by mouth 3 (three) times daily as needed for cough., Disp: 30 capsule, Rfl: 0 .  buPROPion (WELLBUTRIN XL) 300 MG 24 hr tablet, Take 1 tablet (300 mg total) by mouth daily., Disp: 30 tablet, Rfl: 11 .  clobetasol cream (TEMOVATE) 0.01 %, Apply 1 application topically 2 (two) times daily. To arms, Disp: 60 g, Rfl: 1 .  cyanocobalamin 500 MCG tablet, Take 500 mcg by mouth daily., Disp: , Rfl:  .  cyclobenzaprine (FLEXERIL) 10 MG tablet, Take 10 mg by mouth as needed., Disp: , Rfl:  .  dabigatran (PRADAXA) 150 MG CAPS capsule, Take 1 capsule (150 mg total) by mouth 2 (two) times daily., Disp: 180 capsule, Rfl: 2 .  dexlansoprazole (DEXILANT) 60 MG capsule, Take 1 capsule (60 mg total) by mouth daily. Minutes before meal (lunch), Disp: 90 capsule, Rfl: 1 .  diclofenac sodium (VOLTAREN) 1 % GEL, Apply 1 application topically as needed., Disp: , Rfl:  .  doxycycline (VIBRA-TABS) 100 MG tablet, Take 1 tablet (100 mg total) by mouth 2 (two) times daily., Disp: 14 tablet, Rfl: 0 .  DULoxetine (CYMBALTA) 60 MG capsule, Take 1 capsule (60 mg total) by mouth daily., Disp: 90 capsule, Rfl: 0 .  estradiol (ESTRACE) 0.1 MG/GM vaginal cream, Apply 0.77m (pea-sized amount)  just inside the vaginal introitus with a finger-tip on Monday, Wednesday and Friday nights,, Disp: 42.5 g, Rfl: 11 .  ferrous sulfate 325 (65 FE) MG tablet, Take 325 mg by mouth daily with breakfast., Disp: , Rfl:  .  fluconazole (DIFLUCAN) 150 MG tablet, Take 150 mg by mouth as needed., Disp: , Rfl:  .  Fluticasone-Salmeterol (ADVAIR) 250-50 MCG/DOSE AEPB, Inhale 1 puff into the lungs 2 (two) times daily., Disp: , Rfl:  .  [START ON 03/09/2018] gabapentin (NEURONTIN) 800 MG tablet, Take 1 tablet (800 mg total) by mouth 4 (four) times daily., Disp: 360 tablet, Rfl: 0 .  hydrochlorothiazide (HYDRODIURIL) 12.5 MG tablet, Take 1 tablet (12.5 mg total) by mouth  daily. In am, Disp: 90 tablet, Rfl: 1 .  [START ON 03/09/2018] HYDROcodone-acetaminophen (NORCO/VICODIN) 5-325 MG tablet, Take 1 tablet by mouth every 8 (eight) hours as needed for severe pain., Disp: 90 tablet, Rfl: 0 .  ipratropium-albuterol (DUONEB) 0.5-2.5 (3) MG/3ML SOLN, Take 3 mLs by nebulization 3 (three) times daily as needed., Disp: 360 mL, Rfl: 12 .  levocetirizine (XYZAL) 5 MG tablet, TAKE 1 TABLET BY MOUTH EVERY EVENING, Disp: 90 tablet, Rfl: 3 .  levothyroxine (SYNTHROID, LEVOTHROID) 175 MCG tablet, Take 1 tablet (175 mcg total) by mouth daily before breakfast., Disp: 90 tablet, Rfl: 3 .  meloxicam (MOBIC) 15 MG tablet, TAKE ONE TABLET BY MOUTH EVERY DAY WITH A MEAL, Disp: 90 tablet, Rfl: 0 .  metFORMIN (GLUCOPHAGE) 1000 MG tablet, TAKE ONE  TABLET BY MOUTH TWICE DAILY (Patient taking differently: TAKE ONE TABLET IN THE MORNING AND HALF TABLET AT NIGHT), Disp: 180 tablet, Rfl: 1 .  metoprolol succinate (TOPROL-XL) 100 MG 24 hr tablet, Take 1 tablet (100 mg total) by mouth daily. Take with or immediately following a meal., Disp: 90 tablet, Rfl: 3 .  mirabegron ER (MYRBETRIQ) 50 MG TB24 tablet, Take 1 tablet (50 mg total) by mouth daily., Disp: 30 tablet, Rfl: 6 .  montelukast (SINGULAIR) 10 MG tablet, TAKE ONE TABLET BY MOUTH EVERY DAY, Disp: 90 tablet, Rfl: 3 .  Multiple Vitamin (MULTIVITAMIN WITH MINERALS) TABS tablet, Take 1 tablet by mouth daily., Disp: , Rfl:  .  mupirocin ointment (BACTROBAN) 2 %, Apply 1 application topically 2 (two) times daily. Skin wounds, Disp: 30 g, Rfl: 0 .  nystatin cream (MYCOSTATIN), Apply 1 application topically 2 (two) times daily. Mouth prn for 7 days as needed, Disp: 30 g, Rfl: 11 .  oxybutynin (DITROPAN XL) 15 MG 24 hr tablet, TAKE ONE TABLET BY MOUTH AT BEDTIME, Disp: 90 tablet, Rfl: 3 .  quinapril (ACCUPRIL) 40 MG tablet, Take 1 tablet (40 mg total) by mouth daily., Disp: 90 tablet, Rfl: 3 .  Tiotropium Bromide Monohydrate (SPIRIVA RESPIMAT) 1.25  MCG/ACT AERS, Inhale 2 puffs into the lungs daily., Disp: 1 Inhaler, Rfl: 11 .  zinc oxide 20 % ointment, Apply 1 application topically as needed for irritation., Disp: 56.7 g, Rfl: 0 .  [START ON 05/08/2018] HYDROcodone-acetaminophen (NORCO/VICODIN) 5-325 MG tablet, Take 1 tablet by mouth every 8 (eight) hours as needed for severe pain., Disp: 90 tablet, Rfl: 0 .  [START ON 04/08/2018] HYDROcodone-acetaminophen (NORCO/VICODIN) 5-325 MG tablet, Take 1 tablet by mouth every 8 (eight) hours as needed for severe pain., Disp: 90 tablet, Rfl: 0  Current Facility-Administered Medications:  .  ipratropium-albuterol (DUONEB) 0.5-2.5 (3) MG/3ML nebulizer solution 3 mL, 3 mL, Nebulization, Q6H, [name], Tracy N, MD  ROS  Constitutional: Denies any fever or chills Gastrointestinal: No reported hemesis, hematochezia, vomiting, or acute GI distress Musculoskeletal: Denies any acute onset joint swelling, redness, loss of ROM, or weakness Neurological: No reported episodes of acute onset apraxia, aphasia, dysarthria, agnosia, amnesia, paralysis, loss of coordination, or loss of consciousness  Allergies  Ms. Venuto is allergic to penicillins; aspirin; morphine and related; and terramycin [oxytetracycline].  PFSH  Drug: Ms. Haug  reports that she does not use drugs. Alcohol:  reports that she does not drink alcohol. Tobacco:  reports that she has never smoked. She has never used smokeless tobacco. Medical:  has a past medical history of Abnormal antibody titer, Anxiety and depression, Arthritis, Asthma, Cystocele, Depression, Diabetes (HCC), DVT (deep venous thrombosis) (HCC), Dysrhythmia, Edema, GERD (gastroesophageal reflux disease), Heart murmur, History of IBS, History of methicillin resistant staphylococcus aureus (MRSA) (2008), Hyperlipidemia, Hypertension, Hypothyroidism, Insomnia, Kidney stone, Neuropathy involving both lower extremities, Obesity, Class II, BMI 35-39.9, with comorbidity,  Paroxysmal atrial fibrillation (HCC) (2007), Peripheral vascular disease (HCC), Sleep apnea, Urinary incontinence, and Venous stasis dermatitis of both lower extremities. Surgical: Ms. Foor  has a past surgical history that includes Ankle surgery (Right); Vaginal hysterectomy; Sleeve Gastroplasty; Lithotripsy; Kidney surgery (Right); transthoracic echocardiogram (08/03/2013); transesophageal echocardiogram (08/08/2013); Tonsillectomy; Total knee arthroplasty; Hiatal hernia repair; Cholecystectomy; I&D extremity (Left, 06/27/2015); Application if wound vac (Left, 06/27/2015); removal of left hematoma (Left); NM GATED MYOVIEW ([facility] HX) (February 2017); Reverse shoulder arthroplasty (Right, 10/08/2015); Hernia repair; Ulnar nerve transposition (Right, 08/06/2016); arm surgery; Irrigation and debridement abscess (Left, 09/07/2016); Incision and   drainage of wound (Left, 09/25/2016); Application if wound vac (Left, 09/25/2016); Colonoscopy; Upper gi endoscopy; Esophagogastroduodenoscopy (egd) with propofol (N/A, 01/11/2017); Colonoscopy with propofol (N/A, 01/11/2017); Colonoscopy with propofol (N/A, 02/22/2017); OTHER SURGICAL HISTORY; Total shoulder replacement (Right); Joint replacement (2013); and Colonoscopy with propofol (N/A, 05/31/2017). Family: family history includes Alcohol abuse in her father; Arthritis in her maternal grandfather, maternal grandmother, mother, paternal grandfather, and paternal grandmother; Breast cancer in her maternal aunt; Cancer in her father and maternal aunt; Cerebral aneurysm in her father; Diabetes in her father, maternal grandmother, and paternal grandmother; Heart disease in her maternal grandfather; Hypertension in her father, maternal grandfather, maternal grandmother, paternal grandfather, and paternal grandmother; Kidney disease in her mother; Mental illness in her sister; Peripheral vascular disease in her father; Skin cancer in her father; Varicose Veins in her  mother.  Constitutional Exam  General appearance: Well nourished, well developed, and well hydrated. In no apparent acute distress Vitals:   02/22/18 1335  BP: (!) 138/57  Pulse: (!) 52  Resp: 16  Temp: 97.7 F (36.5 C)  SpO2: 95%  Weight: 252 lb (114.3 kg)  Height: 5' 5" (1.651 m)  Psych/Mental status: Alert, oriented x 3 (person, place, & time)       Eyes: PERLA Respiratory: No evidence of acute respiratory distress   Lumbar Spine Area Exam  Skin & Axial Inspection: No masses, redness, or swelling Alignment: Symmetrical Functional ROM: Unrestricted ROM       Stability: No instability detected Muscle Tone/Strength: Functionally intact. No obvious neuro-muscular anomalies detected. Sensory (Neurological): Unimpaired Palpation: No palpable anomalies         Gait & Posture Assessment  Ambulation: Patient ambulates using a walker Gait: Relatively normal for age and body habitus Posture: WNL   Lower Extremity Exam    Side: Right lower extremity  Side: Left lower extremity  Stability: No instability observed          Stability: No instability observed          Skin & Extremity Inspection: boot worn  Skin & Extremity Inspection: Skin color, temperature, and hair growth are WNL. No peripheral edema or cyanosis. No masses, redness, swelling, asymmetry, or associated skin lesions. No contractures.  Functional ROM: Unrestricted ROM                  Functional ROM: Unrestricted ROM                  Muscle Tone/Strength: Functionally intact. No obvious neuro-muscular anomalies detected.  Muscle Tone/Strength: Functionally intact. No obvious neuro-muscular anomalies detected.  Sensory (Neurological): Unimpaired        Sensory (Neurological): Unimpaired        Palpation: No palpable anomalies  Palpation: No palpable anomalies   Assessment  Primary Diagnosis & Pertinent Problem List: The primary encounter diagnosis was Peripheral vascular disease due to secondary diabetes mellitus  ([place]). Diagnoses of Diabetic peripheral neuropathy associated with type 2 diabetes mellitus ([place]), Depression with anxiety, Chronic pain syndrome, and Long term current use of opiate analgesic were also pertinent to this visit.  Status Diagnosis  Controlled Controlled Controlled 1. Peripheral vascular disease due to secondary diabetes mellitus ([place])   2. Diabetic peripheral neuropathy associated with type 2 diabetes mellitus ([place])   3. Depression with anxiety   4. Chronic pain syndrome   5. Long term current use of opiate analgesic     Problems updated and reviewed during this visit: No problems updated. Plan of Care  Pharmacotherapy (Medications  Ordered): Meds ordered this encounter  Medications  . gabapentin (NEURONTIN) 800 MG tablet    Sig: Take 1 tablet (800 mg total) by mouth 4 (four) times daily.    Dispense:  360 tablet    Refill:  0    Do not place this medication, or any other prescription from our practice, on "Automatic Refill". Patient may have prescription filled one day early if pharmacy is closed on scheduled refill date.    Order Specific Question:   Supervising Provider    Answer:   Milinda Pointer [phone]  . HYDROcodone-acetaminophen (NORCO/VICODIN) 5-325 MG tablet    Sig: Take 1 tablet by mouth every 8 (eight) hours as needed for severe pain.    Dispense:  90 tablet    Refill:  0    Do not place this medication, or any other prescription from our practice, on "Automatic Refill". Patient may have prescription filled one day early if pharmacy is closed on scheduled refill date.    Order Specific Question:   Supervising Provider    Answer:   Milinda Pointer [phone]  . HYDROcodone-acetaminophen (NORCO/VICODIN) 5-325 MG tablet    Sig: Take 1 tablet by mouth every 8 (eight) hours as needed for severe pain.    Dispense:  90 tablet    Refill:  0    Do not add this medication to the electronic "Automatic Refill" notification system. Patient may have prescription  filled one day early if pharmacy is closed on scheduled refill date.    Order Specific Question:   Supervising Provider    Answer:   Milinda Pointer [phone]  . HYDROcodone-acetaminophen (NORCO/VICODIN) 5-325 MG tablet    Sig: Take 1 tablet by mouth every 8 (eight) hours as needed for severe pain.    Dispense:  90 tablet    Refill:  0    Do not add this medication to the electronic "Automatic Refill" notification system. Patient may have prescription filled one day early if pharmacy is closed on scheduled refill date.    Order Specific Question:   Supervising Provider    Answer:   Milinda Pointer [phone]  . DULoxetine (CYMBALTA) 60 MG capsule    Sig: Take 1 capsule (60 mg total) by mouth daily.    Dispense:  90 capsule    Refill:  0    Do not place this medication, or any other prescription from our practice, on "Automatic Refill". Patient may have prescription filled one day early if pharmacy is closed on scheduled refill date.    Order Specific Question:   Supervising Provider    Answer:   Milinda Pointer [[id number]]   New Prescriptions   No medications on file   Medications administered today: [name] had no medications administered during this visit. Lab-work, procedure(s), and/or referral(s): No orders of the defined types were placed in this encounter.  Imaging and/or referral(s): None Interventional therapies: Planned, scheduled, and/or pending:  Continue with current regimen (Always stop Pradaxa x 5 days prior to any blocks.)   Considering:  Diagnostic Right suprascapular nerve block Diagnostic right intra-articular knee injection Possible series of 5 Hyalgan knee injections Diagnostic right-sided genicular nerve block Possible right-sided genicular nerve radiofrequencyablation  Palliative right intra-articular shoulder joint injection Diagnostic right-sided suprascapular nerve block Possible right-sided suprascapular nerve radiofrequencyablation   Palliative right lateral femoral cutaneous nerve block Palliative repeat right sided lateral femoral continuous nerve radiofrequencyablation   Palliative PRN treatment(s):  Diagnostic right intra-articular knee injection Possible series of 5 Hyalgan knee injections Diagnostic  right-sided genicular nerve block Palliative right intra-articular shoulder joint injection Diagnostic right-sided suprascapular nerve block Palliative right lateral femoral cutaneous nerve block Palliative repeat right sided lateral femoral continuous nerve radiofrequencyablation     Provider-requested follow-up: Return in about 3 months (around 05/24/2018) for MedMgmt.  Future Appointments  Date Time Provider Department Center  03/09/2018  8:30 AM [name], Tracy N, MD [facility]  05/19/2018  1:45 PM ,  M, NP [facility] None   Primary Care Physician: [name], Tracy N, MD Location: [facility] Note by:  M.  NP Date: 02/22/2018; Time: 3:29 PM  Pain Score Disclaimer: We use the NRS-11 scale. This is a self-reported, subjective measurement of pain severity with only modest accuracy. It is used primarily to identify changes within a particular patient. It must be understood that outpatient pain scales are significantly less accurate that those used for research, where they can be applied under ideal controlled circumstances with minimal exposure to variables. In reality, the score is likely to be a combination of pain intensity and pain affect, where pain affect describes the degree of emotional arousal or changes in action readiness caused by the sensory experience of pain. Factors such as social and work situation, setting, emotional state, anxiety levels, expectation, and prior pain experience may influence pain perception and show large inter-individual differences that may also be affected by time variables.  Patient instructions provided  during this appointment: Patient Instructions   ____________________________________________________________________________________________  Medication Rules  Purpose: To inform patients, and their family members, of our rules and regulations.  Applies to: All patients receiving prescriptions (written or electronic).  Pharmacy of record: Pharmacy where electronic prescriptions will be sent. If written prescriptions are taken to a different pharmacy, please inform the nursing staff. The pharmacy listed in the electronic medical record should be the one where you would like electronic prescriptions to be sent.  Electronic prescriptions: In compliance with the  Strengthen Opioid Misuse Prevention (STOP) Act of 2017 (Session Law 2017-74/H243), effective March 23, 2018, all controlled substances must be electronically prescribed. Calling prescriptions to the pharmacy will cease to exist.  Prescription refills: Only during scheduled appointments. Applies to all prescriptions.  NOTE: The following applies primarily to controlled substances (Opioid* Pain Medications).   Patient's responsibilities: 1. Pain Pills: Bring all pain pills to every appointment (except for procedure appointments). 2. Pill Bottles: Bring pills in original pharmacy bottle. Always bring the newest bottle. Bring bottle, even if empty. 3. Medication refills: You are responsible for knowing and keeping track of what medications you take and those you need refilled. The day before your appointment: write a list of all prescriptions that need to be refilled. The day of the appointment: give the list to the admitting nurse. Prescriptions will be written only during appointments. If you forget a medication: it will not be "Called in", "Faxed", or "electronically sent". You will need to get another appointment to get these prescribed. No early refills. Do not call asking to have your prescription filled  early. 4. Prescription Accuracy: You are responsible for carefully inspecting your prescriptions before leaving our office. Have the discharge nurse carefully go over each prescription with you, before taking them home. Make sure that your name is accurately spelled, that your address is correct. Check the name and dose of your medication to make sure it is accurate. Check the number of pills, and the written instructions to make sure they are clear and accurate. Make sure that you are given   enough medication to last until your next medication refill appointment. 5. Taking Medication: Take medication as prescribed. When it comes to controlled substances, taking less pills or less frequently than prescribed is permitted and encouraged. Never take more pills than instructed. Never take medication more frequently than prescribed.  6. Inform other Doctors: Always inform, all of your healthcare providers, of all the medications you take. 7. Pain Medication from other Providers: You are not allowed to accept any additional pain medication from any other Doctor or Healthcare provider. There are two exceptions to this rule. (see below) In the event that you require additional pain medication, you are responsible for notifying us, as stated below. 8. Medication Agreement: You are responsible for carefully reading and following our Medication Agreement. This must be signed before receiving any prescriptions from our practice. Safely store a copy of your signed Agreement. Violations to the Agreement will result in no further prescriptions. (Additional copies of our Medication Agreement are available upon request.) 9. Laws, Rules, & Regulations: All patients are expected to follow all Federal and State Laws, Statutes, Rules, & Regulations. Ignorance of the Laws does not constitute a valid excuse. The use of any illegal substances is prohibited. 10. Adopted CDC guidelines & recommendations: Target dosing levels will be  at or below 60 MME/day. Use of benzodiazepines** is not recommended.  Exceptions: There are only two exceptions to the rule of not receiving pain medications from other Healthcare Providers. 1. Exception #1 (Emergencies): In the event of an emergency (i.e.: accident requiring emergency care), you are allowed to receive additional pain medication. However, you are responsible for: As soon as you are able, call our office [phone], at any time of the day or night, and leave a message stating your name, the date and nature of the emergency, and the name and dose of the medication prescribed. In the event that your call is answered by a member of our staff, make sure to document and save the date, time, and the name of the person that took your information.  2. Exception #2 (Planned Surgery): In the event that you are scheduled by another doctor or dentist to have any type of surgery or procedure, you are allowed (for a period no longer than 30 days), to receive additional pain medication, for the acute post-op pain. However, in this case, you are responsible for picking up a copy of our "Post-op Pain Management for Surgeons" handout, and giving it to your surgeon or dentist. This document is available at our office, and does not require an appointment to obtain it. Simply go to our office during business hours (Monday-Thursday from 8:00 AM to 4:00 PM) (Friday 8:00 AM to 12:00 Noon) or if you have a scheduled appointment with us, prior to your surgery, and ask for it by name. In addition, you will need to provide us with your name, name of your surgeon, type of surgery, and date of procedure or surgery.  *Opioid medications include: morphine, codeine, oxycodone, oxymorphone, hydrocodone, hydromorphone, meperidine, tramadol, tapentadol, buprenorphine, fentanyl, methadone. **Benzodiazepine medications include: diazepam (Valium), alprazolam (Xanax), clonazepam (Klonopine), lorazepam (Ativan), clorazepate  (Tranxene), chlordiazepoxide (Librium), estazolam (Prosom), oxazepam (Serax), temazepam (Restoril), triazolam (Halcion) (Last updated: 05/20/2017) ____________________________________________________________________________________________     BMI Assessment: Estimated body mass index is 41.93 kg/m as calculated from the following:   Height as of this encounter: 5' 5" (1.651 m).   Weight as of this encounter: 252 lb (114.3 kg).  BMI interpretation table: BMI level Category Range association   with higher incidence of chronic pain  <18 kg/m2 Underweight   18.5-24.9 kg/m2 Ideal body weight   25-29.9 kg/m2 Overweight Increased incidence by 20%  30-34.9 kg/m2 Obese (Class I) Increased incidence by 68%  35-39.9 kg/m2 Severe obesity (Class II) Increased incidence by 136%  >40 kg/m2 Extreme obesity (Class III) Increased incidence by 254%   Patient's current BMI Ideal Body weight  Body mass index is 41.93 kg/m. Ideal body weight: 57 kg (125 lb 10.6 oz) Adjusted ideal body weight: 79.9 kg (176 lb 3.2 oz)   BMI Readings from Last 4 Encounters:  02/22/18 41.93 kg/m  02/21/18 41.93 kg/m  01/18/18 43.63 kg/m  12/31/17 43.12 kg/m   Wt Readings from Last 4 Encounters:  02/22/18 252 lb (114.3 kg)  02/21/18 252 lb (114.3 kg)  01/18/18 262 lb 3.2 oz (118.9 kg)  12/13/17 259 lb 1.6 oz (117.5 kg)

## 2018-02-22 NOTE — Patient Instructions (Addendum)
____________________________________________________________________________________________  Medication Rules  Purpose: To inform patients, and their family members, of our rules and regulations.  Applies to: All patients receiving prescriptions (written or electronic).  Pharmacy of record: Pharmacy where electronic prescriptions will be sent. If written prescriptions are taken to a different pharmacy, please inform the nursing staff. The pharmacy listed in the electronic medical record should be the one where you would like electronic prescriptions to be sent.  Electronic prescriptions: In compliance with the [place] Strengthen Opioid Misuse Prevention (STOP) Act of 2017 (Session Law 2017-74/H243), effective March 23, 2018, all controlled substances must be electronically prescribed. Calling prescriptions to the pharmacy will cease to exist.  Prescription refills: Only during scheduled appointments. Applies to all prescriptions.  NOTE: The following applies primarily to controlled substances (Opioid* Pain Medications).   Patient's responsibilities: 1. Pain Pills: Bring all pain pills to every appointment (except for procedure appointments). 2. Pill Bottles: Bring pills in original pharmacy bottle. Always bring the newest bottle. Bring bottle, even if empty. 3. Medication refills: You are responsible for knowing and keeping track of what medications you take and those you need refilled. The day before your appointment: write a list of all prescriptions that need to be refilled. The day of the appointment: give the list to the admitting nurse. Prescriptions will be written only during appointments. If you forget a medication: it will not be "Called in", "Faxed", or "electronically sent". You will need to get another appointment to get these prescribed. No early refills. Do not call asking to have your prescription filled early. 4. Prescription Accuracy: You are responsible for  carefully inspecting your prescriptions before leaving our office. Have the discharge nurse carefully go over each prescription with you, before taking them home. Make sure that your name is accurately spelled, that your address is correct. Check the name and dose of your medication to make sure it is accurate. Check the number of pills, and the written instructions to make sure they are clear and accurate. Make sure that you are given enough medication to last until your next medication refill appointment. 5. Taking Medication: Take medication as prescribed. When it comes to controlled substances, taking less pills or less frequently than prescribed is permitted and encouraged. Never take more pills than instructed. Never take medication more frequently than prescribed.  6. Inform other Doctors: Always inform, all of your healthcare providers, of all the medications you take. 7. Pain Medication from other Providers: You are not allowed to accept any additional pain medication from any other Doctor or Healthcare provider. There are two exceptions to this rule. (see below) In the event that you require additional pain medication, you are responsible for notifying us, as stated below. 8. Medication Agreement: You are responsible for carefully reading and following our Medication Agreement. This must be signed before receiving any prescriptions from our practice. Safely store a copy of your signed Agreement. Violations to the Agreement will result in no further prescriptions. (Additional copies of our Medication Agreement are available upon request.) 9. Laws, Rules, & Regulations: All patients are expected to follow all Federal and State Laws, Statutes, Rules, & Regulations. Ignorance of the Laws does not constitute a valid excuse. The use of any illegal substances is prohibited. 10. Adopted CDC guidelines & recommendations: Target dosing levels will be at or below 60 MME/day. Use of benzodiazepines** is not  recommended.  Exceptions: There are only two exceptions to the rule of not receiving pain medications from other Healthcare Providers. 1.   Exception #1 (Emergencies): In the event of an emergency (i.e.: accident requiring emergency care), you are allowed to receive additional pain medication. However, you are responsible for: As soon as you are able, call our office [phone], at any time of the day or night, and leave a message stating your name, the date and nature of the emergency, and the name and dose of the medication prescribed. In the event that your call is answered by a member of our staff, make sure to document and save the date, time, and the name of the person that took your information.  2. Exception #2 (Planned Surgery): In the event that you are scheduled by another doctor or dentist to have any type of surgery or procedure, you are allowed (for a period no longer than 30 days), to receive additional pain medication, for the acute post-op pain. However, in this case, you are responsible for picking up a copy of our "Post-op Pain Management for Surgeons" handout, and giving it to your surgeon or dentist. This document is available at our office, and does not require an appointment to obtain it. Simply go to our office during business hours (Monday-Thursday from 8:00 AM to 4:00 PM) (Friday 8:00 AM to 12:00 Noon) or if you have a scheduled appointment with [place], prior to your surgery, and ask for it by name. In addition, you will need to provide [place] with your name, name of your surgeon, type of surgery, and date of procedure or surgery.  *Opioid medications include: morphine, codeine, oxycodone, oxymorphone, hydrocodone, hydromorphone, meperidine, tramadol, tapentadol, buprenorphine, fentanyl, methadone. **Benzodiazepine medications include: diazepam (Valium), alprazolam (Xanax), clonazepam (Klonopine), lorazepam (Ativan), clorazepate (Tranxene), chlordiazepoxide (Librium), estazolam (Prosom),  oxazepam (Serax), temazepam (Restoril), triazolam (Halcion) (Last updated: 05/20/2017) ____________________________________________________________________________________________     BMI Assessment: Estimated body mass index is 41.93 kg/m as calculated from the following:   Height as of this encounter: 5\' 5"  (1.651 m).   Weight as of this encounter: 252 lb (114.3 kg).  BMI interpretation table: BMI level Category Range association with higher incidence of chronic pain  <18 kg/m2 Underweight   18.5-24.9 kg/m2 Ideal body weight   25-29.9 kg/m2 Overweight Increased incidence by 20%  30-34.9 kg/m2 Obese (Class I) Increased incidence by 68%  35-39.9 kg/m2 Severe obesity (Class II) Increased incidence by 136%  >40 kg/m2 Extreme obesity (Class III) Increased incidence by 254%   Patient's current BMI Ideal Body weight  Body mass index is 41.93 kg/m. Ideal body weight: 57 kg (125 lb 10.6 oz) Adjusted ideal body weight: 79.9 kg (176 lb 3.2 oz)   BMI Readings from Last 4 Encounters:  02/22/18 41.93 kg/m  02/21/18 41.93 kg/m  01/18/18 43.63 kg/m  12/31/17 43.12 kg/m   Wt Readings from Last 4 Encounters:  02/22/18 252 lb (114.3 kg)  02/21/18 252 lb (114.3 kg)  01/18/18 262 lb 3.2 oz (118.9 kg)  12/13/17 259 lb 1.6 oz (117.5 kg)

## 2018-02-24 LAB — CULTURE, URINE COMPREHENSIVE

## 2018-03-07 DIAGNOSIS — E1159 Type 2 diabetes mellitus with other circulatory complications: Secondary | ICD-10-CM | POA: Diagnosis not present

## 2018-03-07 DIAGNOSIS — E1143 Type 2 diabetes mellitus with diabetic autonomic (poly)neuropathy: Secondary | ICD-10-CM | POA: Diagnosis not present

## 2018-03-07 DIAGNOSIS — L97521 Non-pressure chronic ulcer of other part of left foot limited to breakdown of skin: Secondary | ICD-10-CM | POA: Diagnosis not present

## 2018-03-08 IMAGING — US US EXTREM LOW VENOUS*L*
1 series · 13 of 24 positions shown · non-contrast
Comparison: 06/22/2008 left lower extremity venous Doppler scan.

CLINICAL DATA: Left lower extremity pain and erythema



[Series 1: us extrem low venous*left* · 0.08mm/px · 13 of 34 slices shown]
[im 1/34]
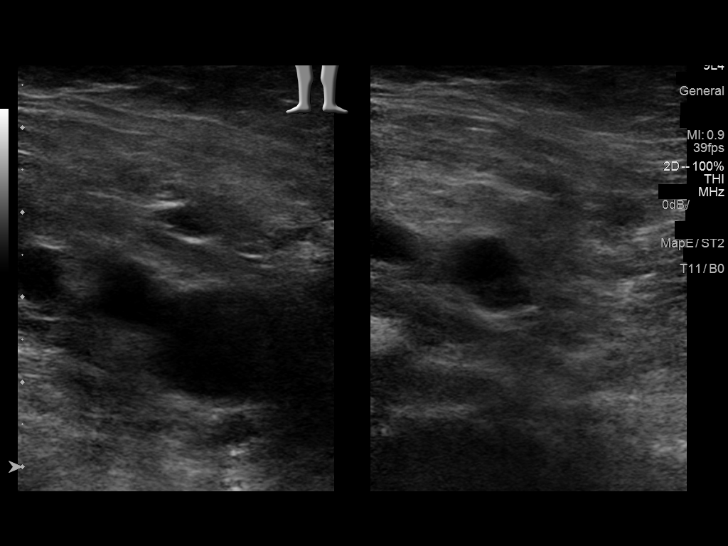
[im 3/34]
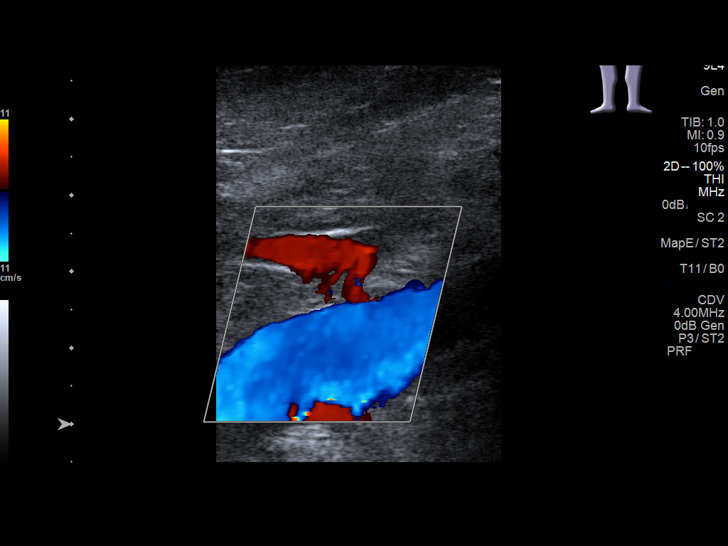
[im 6/34]
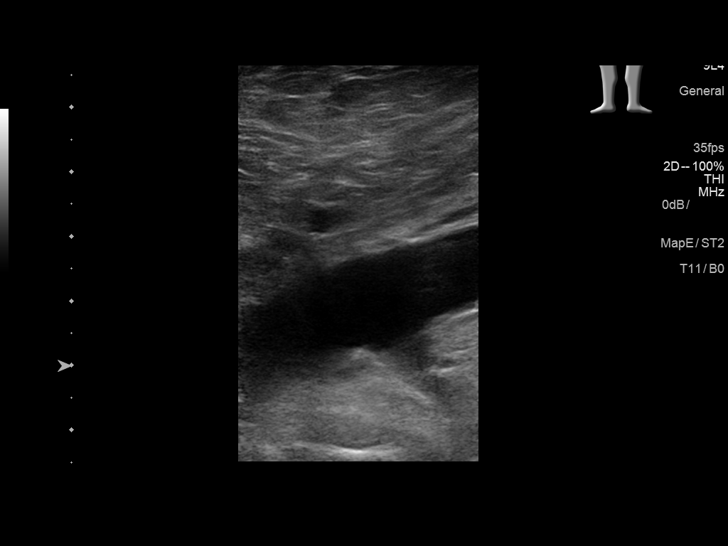
[im 9/34]
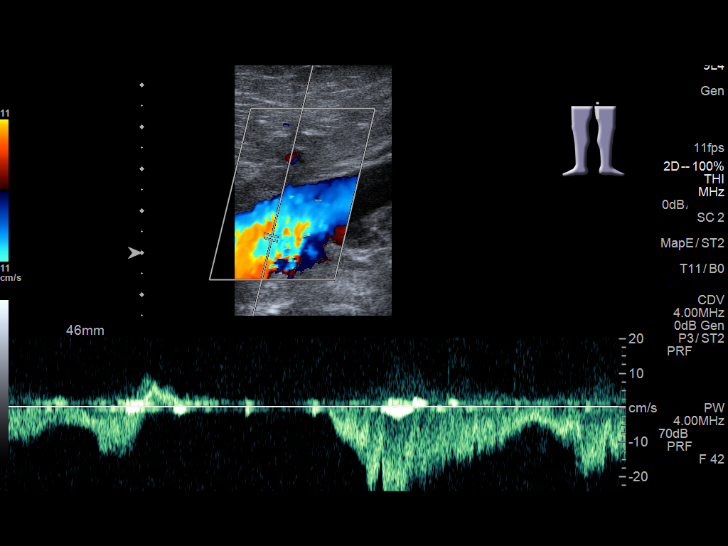
[im 12/34]
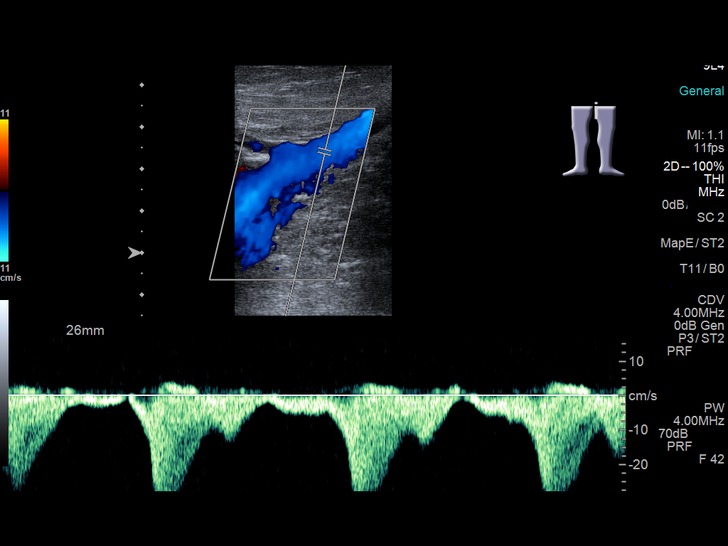
[im 15/34]
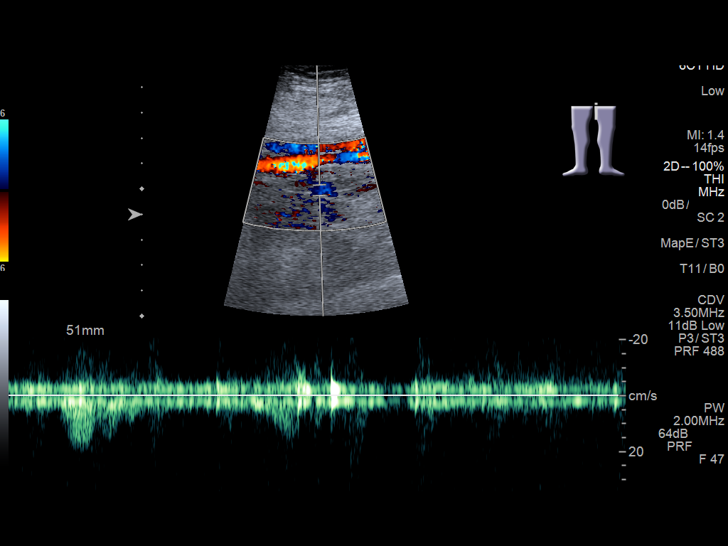
[im 18/34]
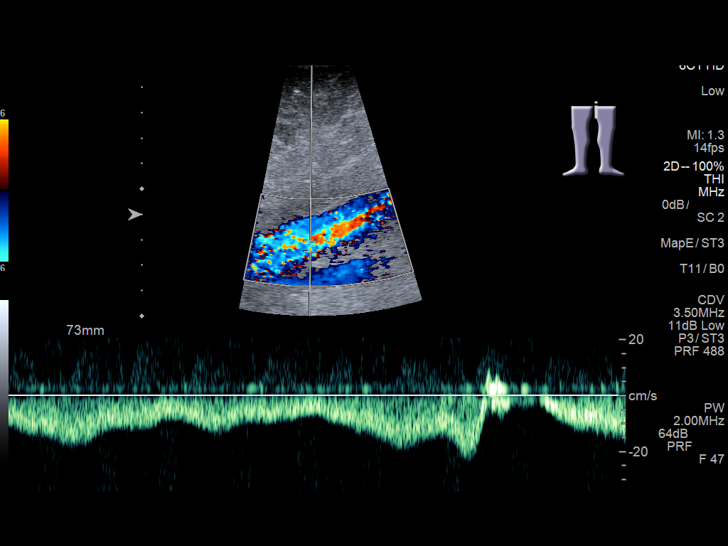
[im 19/34]
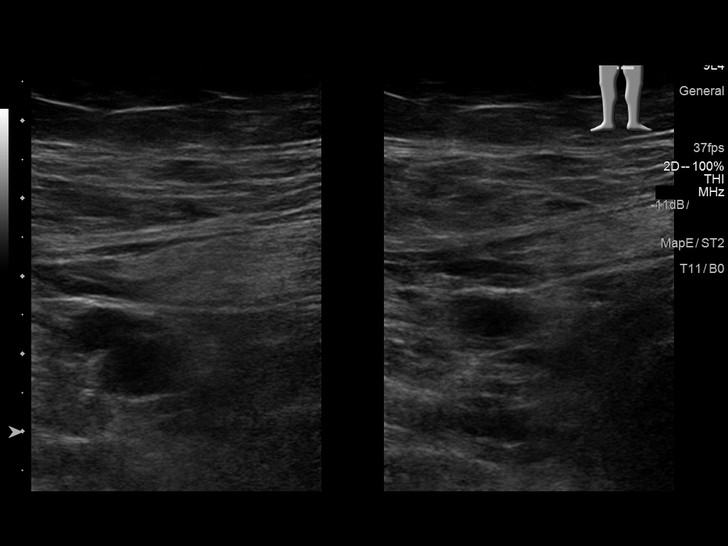
[im 22/34]
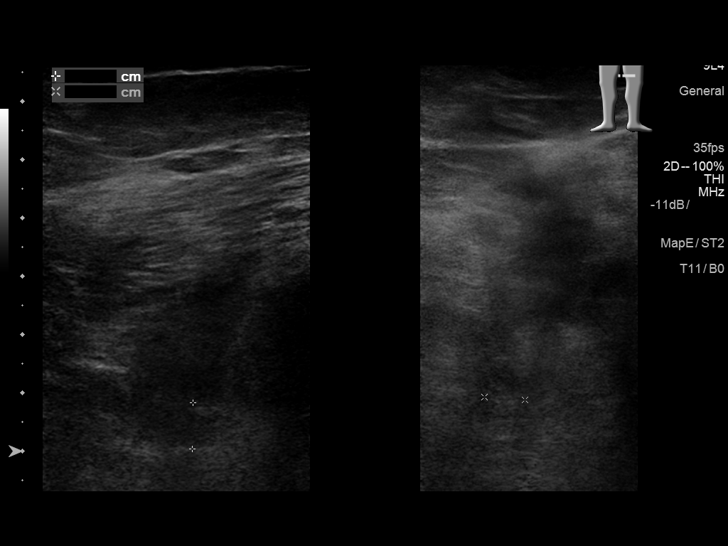
[im 25/34]
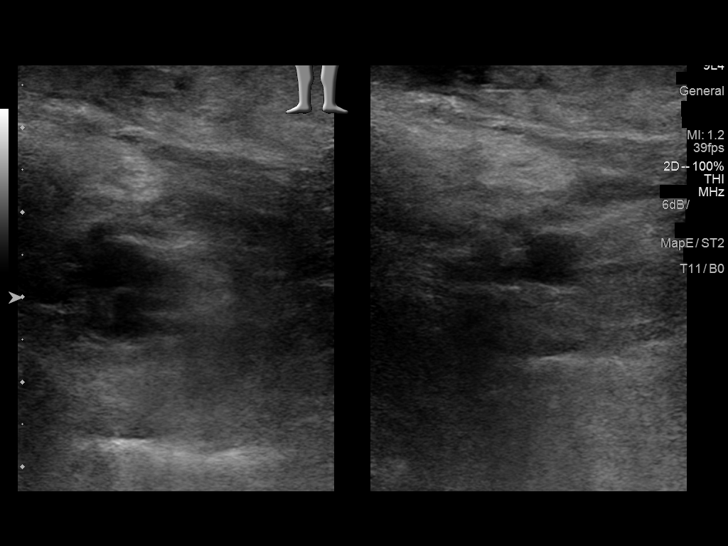
[im 28/34]
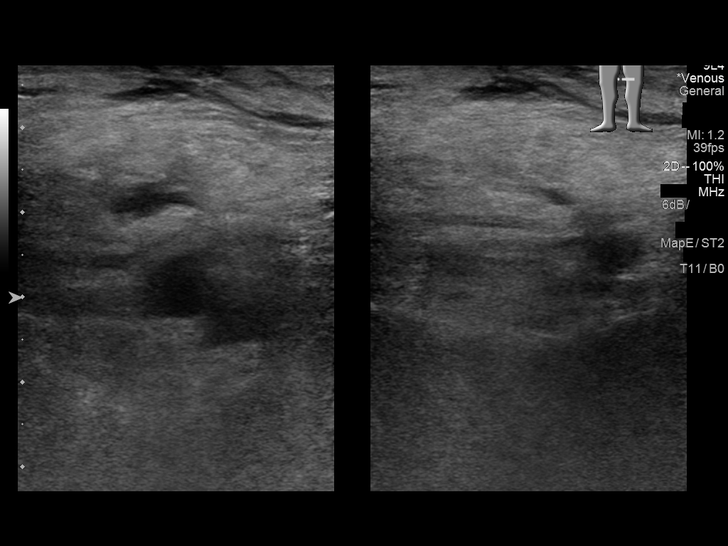
[im 31/34]
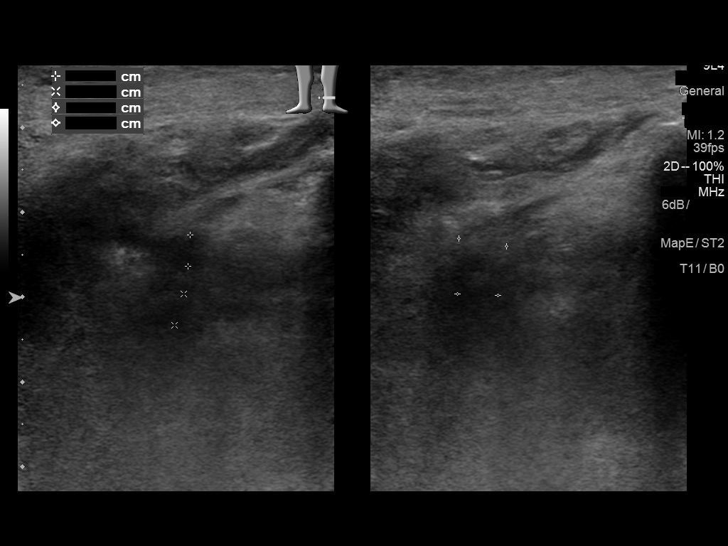
[im 34/34]
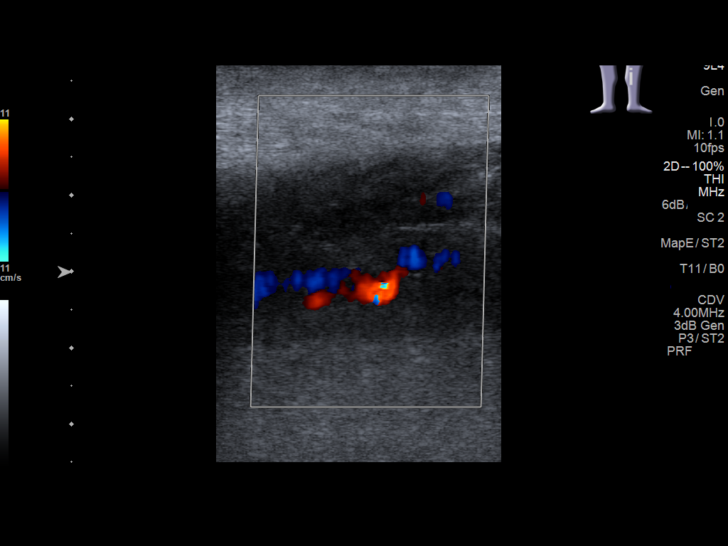

[13 of 24 positions shown; findings below may reference images not displayed]

FINDINGS: Contralateral Common Femoral Vein: Respiratory phasicity is normal
and symmetric with the symptomatic side. No evidence of thrombus.
Normal compressibility.

Common Femoral Vein: No evidence of thrombus. Normal
compressibility, respiratory phasicity and response to augmentation.

Saphenofemoral Junction: No evidence of thrombus. Normal
compressibility and flow on color Doppler imaging.

Profunda Femoral Vein: No evidence of thrombus. Normal
compressibility and flow on color Doppler imaging.

Femoral Vein: No evidence of thrombus. Normal compressibility,
respiratory phasicity and response to augmentation.

Popliteal Vein: No evidence of thrombus. Normal compressibility,
respiratory phasicity and response to augmentation.

Calf Veins: No evidence of thrombus. Normal compressibility and flow
on color Doppler imaging.

Superficial Great Saphenous Vein: No evidence of thrombus. Normal
compressibility and flow on color Doppler imaging.

Venous Reflux:  None.

Other Findings:  None.
IMPRESSION: No evidence of DVT within the left lower extremity.

## 2018-03-09 ENCOUNTER — Ambulatory Visit (INDEPENDENT_AMBULATORY_CARE_PROVIDER_SITE_OTHER): Payer: PPO | Admitting: Internal Medicine

## 2018-03-09 ENCOUNTER — Encounter: Payer: Self-pay | Admitting: Internal Medicine

## 2018-03-09 VITALS — BP 130/62 | HR 58 | Temp 97.7°F | Ht 65.0 in

## 2018-03-09 DIAGNOSIS — I10 Essential (primary) hypertension: Secondary | ICD-10-CM

## 2018-03-09 DIAGNOSIS — R079 Chest pain, unspecified: Secondary | ICD-10-CM | POA: Diagnosis not present

## 2018-03-09 DIAGNOSIS — Z1329 Encounter for screening for other suspected endocrine disorder: Secondary | ICD-10-CM

## 2018-03-09 DIAGNOSIS — D649 Anemia, unspecified: Secondary | ICD-10-CM

## 2018-03-09 DIAGNOSIS — Z23 Encounter for immunization: Secondary | ICD-10-CM

## 2018-03-09 DIAGNOSIS — L97529 Non-pressure chronic ulcer of other part of left foot with unspecified severity: Secondary | ICD-10-CM | POA: Insufficient documentation

## 2018-03-09 DIAGNOSIS — L97422 Non-pressure chronic ulcer of left heel and midfoot with fat layer exposed: Secondary | ICD-10-CM

## 2018-03-09 DIAGNOSIS — E1151 Type 2 diabetes mellitus with diabetic peripheral angiopathy without gangrene: Secondary | ICD-10-CM

## 2018-03-09 DIAGNOSIS — E1142 Type 2 diabetes mellitus with diabetic polyneuropathy: Secondary | ICD-10-CM

## 2018-03-09 DIAGNOSIS — E11621 Type 2 diabetes mellitus with foot ulcer: Secondary | ICD-10-CM | POA: Diagnosis not present

## 2018-03-09 NOTE — Progress Notes (Signed)
Pre visit review using our clinic review tool, if applicable. No additional management support is needed unless otherwise documented below in the visit note. 

## 2018-03-09 NOTE — Patient Instructions (Signed)
We referred to Dr. Fletcher Anon (cardiology)  Check with pharmacist about metformin    Nonspecific Chest Pain, Adult Chest pain can be caused by many different conditions. It can be caused by a condition that is life-threatening and requires treatment right away. It can also be caused by something that is not life-threatening. If you have chest pain, it can be hard to know the difference, so it is important to get help right away to make sure that you do not have a serious condition. Some life-threatening causes of chest pain include:  Heart attack.  A tear in the body's main blood vessel (aortic dissection).  Inflammation around your heart (pericarditis).  A problem in the lungs, such as a blood clot (pulmonary embolism) or a collapsed lung (pneumothorax). Some non life-threatening causes of chest pain include:  Heartburn.  Anxiety or stress.  Damage to the bones, muscles, and cartilage that make up your chest wall.  Pneumonia or bronchitis.  Shingles infection (varicella-zoster virus). Chest pain can feel like:  Pain or discomfort on the surface of your chest or deep in your chest.  Crushing, pressure, aching, or squeezing pain.  Burning or tingling.  Dull or sharp pain that is worse when you move, cough, or take a deep breath.  Pain or discomfort that is also felt in your back, neck, jaw, shoulder, or arm, or pain that spreads to any of these areas. Your chest pain may come and go. It may also be constant. Your health care provider will do lab tests and other studies to find the cause of your pain. Treatment will depend on the cause of your chest pain. Follow these instructions at home: Medicines  Take over-the-counter and prescription medicines only as told by your health care provider.  If you were prescribed an antibiotic, take it as told by your health care provider. Do not stop taking the antibiotic even if you start to feel better. Lifestyle   Rest as directed by your  health care provider.  Do not use any products that contain nicotine or tobacco, such as cigarettes and e-cigarettes. If you need help quitting, ask your health care provider.  Do not drink alcohol.  Make healthy lifestyle choices as recommended. These may include: ? Getting regular exercise. Ask your health care provider to suggest some activities that are safe for you. ? Eating a heart-healthy diet. This includes plenty of fresh fruits and vegetables, whole grains, low-fat (lean) protein, and low-fat dairy products. A dietitian can help you find healthy eating options. ? Maintaining a healthy weight. ? Managing any other health conditions you have, such as high blood pressure (hypertension) or diabetes. ? Reducing stress, such as with yoga or relaxation techniques. General instructions  Pay attention to any changes in your symptoms. Tell your health care provider about them or any new symptoms.  Avoid any activities that cause chest pain.  Keep all follow-up visits as told by your health care provider. This is important. This includes visits for any further testing if your chest pain does not go away. Contact a health care provider if:  Your chest pain does not go away.  You feel depressed.  You have a fever. Get help right away if:  Your chest pain gets worse.  You have a cough that gets worse, or you cough up blood.  You have severe pain in your abdomen.  You faint.  You have sudden, unexplained chest discomfort.  You have sudden, unexplained discomfort in your arms, back, neck,  or jaw.  You have shortness of breath at any time.  You suddenly start to sweat, or your skin gets clammy.  You feel nausea or you vomit.  You suddenly feel lightheaded or dizzy.  You have severe weakness, or unexplained weakness or fatigue.  Your heart begins to beat quickly, or it feels like it is skipping beats. These symptoms may represent a serious problem that is an emergency. Do  not wait to see if the symptoms will go away. Get medical help right away. Call your local emergency services (911 in the [place] Do not drive yourself to the hospital. Summary  Chest pain can be caused by a condition that is serious and requires urgent treatment. It may also be caused by something that is not life-threatening.  If you have chest pain, it is very important to see your health care provider. Your health care provider may do lab tests and other studies to find the cause of your pain.  Follow your health care provider's instructions on taking medicines, making lifestyle changes, and getting emergency treatment if symptoms become worse.  Keep all follow-up visits as told by your health care provider. This includes visits for any further testing if your chest pain does not go away. This information is not intended to replace advice given to you by your health care provider. Make sure you discuss any questions you have with your health care provider. Document Released: 12/17/2004 Document Revised: 09/09/2017 Document Reviewed: 09/09/2017 [employer] Interactive Patient Education  2019 [employer].

## 2018-03-09 NOTE — Progress Notes (Signed)
Chief Complaint  Patient presents with  . Follow-up   F/u  1. HTN bp improved on norvasc 5 mg qd, hctz 12.5 mg qd,Toprol XL 100 mg qd, Accupril 40 mg qd  2. C/o left chest pain x 2-3 weeks at rest. She reports no radiation, no sweating n/v. She takes Xanax at times which helps. She has been stress recently as husbands PSA was 1.2 but he was recently dx'ed with prostate cancer via imaging and bxs and he is not being so nice to her at times and also he may need neck surgery upcoming  3. Left heel ulcer and now another ulcer on her 2nd toe f/u with Dr. [name] and also with [facility] tomorrow due to podiatry being worried about her circulation  4. DM 2 on metformin A1C 6.1 11/17/17 she is c/w about recent cancer causing agent in metformin husband has heard about disc with her to check with her pharmacy   Review of Systems  Constitutional: Negative for weight loss.  HENT: Negative for hearing loss.   Eyes: Negative for blurred vision.  Respiratory: Negative for shortness of breath.   Cardiovascular: Positive for chest pain.  Gastrointestinal: Negative for abdominal pain.  Musculoskeletal: Negative for falls.  Skin: Negative for rash.       +DM ulcer to left heel and left 2nd toe    Neurological: Negative for headaches.  Psychiatric/Behavioral: Negative for depression. The patient is nervous/anxious.    Past Medical History:  Diagnosis Date  . Abnormal antibody titer   . Anxiety and depression   . Arthritis   . Asthma   . Cystocele   . Depression   . Diabetes ([place])   . DVT (deep venous thrombosis) (HCC)    Righ calf  . Dysrhythmia   . Edema   . GERD (gastroesophageal reflux disease)   . Heart murmur    Echocardiogram June 2015: Mild MR (possible vegetation seen on TTE, not seen on TEE), normal LV size with moderate concentric LVH. Normal function EF 60-65%. Normal diastolic function. Mild LA dilation.  Marland Kitchen History of IBS   . History of methicillin resistant staphylococcus aureus (MRSA)  2008  . Hyperlipidemia   . Hypertension   . Hypothyroidism   . Insomnia   . Kidney stone    kidney stones with lithotripsy  . Neuropathy involving both lower extremities   . Obesity, Class II, BMI 35-39.9, with comorbidity   . Paroxysmal atrial fibrillation ([place]) 2007   In June 2015, Cardiac Event Monitor: Mostly SR/sinus arrhythmia with PVCs that are frequent. Short bursts of A. fib lasting several minutes;; CHA2DS2-VASc Score = 5 (age, Female, PVD, DM, HTN)  . Peripheral vascular disease ([place])   . Sleep apnea    use C-PAP  . Urinary incontinence   . Venous stasis dermatitis of both lower extremities    Past Surgical History:  Procedure Laterality Date  . ANKLE SURGERY Right   . APPLICATION OF WOUND VAC Left 06/27/2015   Procedure: APPLICATION OF WOUND VAC ( POSSIBLE ) ;  Surgeon: [name], MD;  Location: [facility] ORS;  Service: Vascular;  Laterality: Left;  . APPLICATION OF WOUND VAC Left 09/25/2016   Procedure: APPLICATION OF WOUND VAC;  Surgeon: [name], DPM;  Location: [facility] ORS;  Service: Podiatry;  Laterality: Left;  . arm surgery     right    . CHOLECYSTECTOMY    . COLONOSCOPY    . COLONOSCOPY WITH PROPOFOL N/A 01/11/2017   Procedure: COLONOSCOPY WITH PROPOFOL;  Surgeon: [name], MD;  Location: [facility] ENDOSCOPY;  Service: Endoscopy;  Laterality: N/A;  . COLONOSCOPY WITH PROPOFOL N/A 02/22/2017   Procedure: COLONOSCOPY WITH PROPOFOL;  Surgeon: [name], MD;  Location: [facility] ENDOSCOPY;  Service: Endoscopy;  Laterality: N/A;  . COLONOSCOPY WITH PROPOFOL N/A 05/31/2017   Procedure: COLONOSCOPY WITH PROPOFOL;  Surgeon: [name], MD;  Location: [facility] ENDOSCOPY;  Service: Endoscopy;  Laterality: N/A;  . ESOPHAGOGASTRODUODENOSCOPY (EGD) WITH PROPOFOL N/A 01/11/2017   Procedure: ESOPHAGOGASTRODUODENOSCOPY (EGD) WITH PROPOFOL;  Surgeon: [name], MD;  Location: [facility] ENDOSCOPY;  Service: Endoscopy;  Laterality: N/A;  . HERNIA REPAIR      umbilical  . HIATAL HERNIA REPAIR    . I&D EXTREMITY Left 06/27/2015   Procedure: IRRIGATION AND DEBRIDEMENT EXTREMITY            ( CALF HEMATOMA ) POSSIBLE WOUND VAC;  Surgeon: [name], MD;  Location: [facility] ORS;  Service: Vascular;  Laterality: Left;  . INCISION AND DRAINAGE OF WOUND Left 09/25/2016   Procedure: IRRIGATION AND DEBRIDEMENT - PARTIAL RESECTION OF ACHILLES TENDON WITH WOUND VAC APPLICATION;  Surgeon: [name], DPM;  Location: [facility] ORS;  Service: Podiatry;  Laterality: Left;  . IRRIGATION AND DEBRIDEMENT ABSCESS Left 09/07/2016   Procedure: IRRIGATION AND DEBRIDEMENT ABSCESS LEFT HEEL;  Surgeon: [name], DPM;  Location: [facility] ORS;  Service: Podiatry;  Laterality: Left;  . JOINT REPLACEMENT  2013   left knee replacement  . KIDNEY SURGERY Right    kidney stones  . LITHOTRIPSY    . NM [facility] HX)  February 2017   Likely breast attenuation. LOW RISK study. Normal EF 55-60%.  . OTHER SURGICAL HISTORY     08/2016 or 09/2016 surgery on achilles tenso h/o staph infection heal. Dr. [name]  . removal of left hematoma Left    leg  . REVERSE SHOULDER ARTHROPLASTY Right 10/08/2015   Procedure: REVERSE SHOULDER ARTHROPLASTY;  Surgeon: [name], MD;  Location: [facility] ORS;  Service: Orthopedics;  Laterality: Right;  . SLEEVE GASTROPLASTY    . TONSILLECTOMY    . TOTAL KNEE ARTHROPLASTY    . TOTAL SHOULDER REPLACEMENT Right   . TRANSESOPHAGEAL ECHOCARDIOGRAM  08/08/2013   Mild LVH, EF 60-65%. Moderate LA dilation and mild RA dilation. Mild MR with no evidence of stenosis and no evidence of endocarditis. A false chordae is noted.  . TRANSTHORACIC ECHOCARDIOGRAM  08/03/2013   Mild-Moderate concentric LVH, EF 60-65%. Normal diastolic function. Mild LA dilation. Mild MR with possible vegetation  - not confirmed on TEE   . ULNAR NERVE TRANSPOSITION Right 08/06/2016   Procedure: ULNAR NERVE DECOMPRESSION/TRANSPOSITION;  Surgeon: [name], MD;  Location: [facility] ORS;   Service: Orthopedics;  Laterality: Right;  . UPPER GI ENDOSCOPY    . VAGINAL HYSTERECTOMY     Family History  Problem Relation Age of Onset  . Skin cancer Father   . Diabetes Father   . Hypertension Father   . Peripheral vascular disease Father   . Cancer Father   . Cerebral aneurysm Father   . Alcohol abuse Father   . Varicose Veins Mother   . Kidney disease Mother   . Arthritis Mother   . Mental illness Sister   . Cancer Maternal Aunt        breast  . Breast cancer Maternal Aunt   . Arthritis Maternal Grandmother   . Hypertension Maternal Grandmother   . Diabetes Maternal Grandmother   . Arthritis Maternal Grandfather   .  Heart disease Maternal Grandfather   . Hypertension Maternal Grandfather   . Arthritis Paternal Grandmother   . Hypertension Paternal Grandmother   . Diabetes Paternal Grandmother   . Arthritis Paternal Grandfather   . Hypertension Paternal Grandfather   . Bladder Cancer Neg Hx   . Kidney cancer Neg Hx    Social History   Socioeconomic History  . Marital status: Married    Spouse name: Not on file  . Number of children: Not on file  . Years of education: Not on file  . Highest education level: Not on file  Occupational History  . Not on file  Social Needs  . Financial resource strain: Not on file  . Food insecurity:    Worry: Not on file    Inability: Not on file  . Transportation needs:    Medical: Not on file    Non-medical: Not on file  Tobacco Use  . Smoking status: Never Smoker  . Smokeless tobacco: Never Used  Substance and Sexual Activity  . Alcohol use: No    Alcohol/week: 0.0 standard drinks  . Drug use: No  . Sexual activity: Yes  Lifestyle  . Physical activity:    Days per week: Not on file    Minutes per session: Not on file  . Stress: Not on file  Relationships  . Social connections:    Talks on phone: Not on file    Gets together: Not on file    Attends religious service: Not on file    Active member of club or  organization: Not on file    Attends meetings of clubs or organizations: Not on file    Relationship status: Not on file  . Intimate partner violence:    Fear of current or ex partner: Not on file    Emotionally abused: Not on file    Physically abused: Not on file    Forced sexual activity: Not on file  Other Topics Concern  . Not on file  Social History Narrative   She is currently married -- for 30+ years. Does not work. Does not smoke or take alcohol. She never smoked. She exercises at least 3 days a week since before her gastric surgery.   Marital status reviewed in history of present illness.   She has children and grandchildren    Current Meds  Medication Sig  . albuterol (PROVENTIL HFA;VENTOLIN HFA) 108 (90 Base) MCG/ACT inhaler Inhale 1-2 puffs into the lungs every 4 (four) hours as needed for wheezing or shortness of breath. (Patient taking differently: Inhale 2 puffs into the lungs every 4 (four) hours as needed for wheezing or shortness of breath. )  . albuterol (PROVENTIL) (2.5 MG/3ML) 0.083% nebulizer solution Take 3 mLs (2.5 mg total) by nebulization every 6 (six) hours as needed for wheezing or shortness of breath.  . ALPRAZolam (XANAX) 0.5 MG tablet Take 1 tablet (0.5 mg total) by mouth 2 (two) times daily as needed for anxiety.  Marland Kitchen amLODipine (NORVASC) 5 MG tablet Take 1 tablet (5 mg total) by mouth daily.  . benzonatate (TESSALON) 200 MG capsule Take 1 capsule (200 mg total) by mouth 3 (three) times daily as needed for cough.  Marland Kitchen buPROPion (WELLBUTRIN XL) 300 MG 24 hr tablet Take 1 tablet (300 mg total) by mouth daily.  . clobetasol cream (TEMOVATE) 6.28 % Apply 1 application topically 2 (two) times daily. To arms  . cyanocobalamin 500 MCG tablet Take 500 mcg by mouth daily.  . cyclobenzaprine (  FLEXERIL) 10 MG tablet Take 10 mg by mouth as needed.  . dabigatran (PRADAXA) 150 MG CAPS capsule Take 1 capsule (150 mg total) by mouth 2 (two) times daily.  Marland Kitchen dexlansoprazole  (DEXILANT) 60 MG capsule Take 1 capsule (60 mg total) by mouth daily. Minutes before meal (lunch)  . diclofenac sodium (VOLTAREN) 1 % GEL Apply 1 application topically as needed.  . doxycycline (VIBRA-TABS) 100 MG tablet Take 1 tablet (100 mg total) by mouth 2 (two) times daily.  . DULoxetine (CYMBALTA) 60 MG capsule Take 1 capsule (60 mg total) by mouth daily.  Marland Kitchen estradiol (ESTRACE) 0.1 MG/GM vaginal cream Apply 0.5mg  (pea-sized amount)  just inside the vaginal introitus with a finger-tip on Monday, Wednesday and Friday nights,  . ferrous sulfate 325 (65 FE) MG tablet Take 325 mg by mouth daily with breakfast.  . fluconazole (DIFLUCAN) 150 MG tablet Take 150 mg by mouth as needed.  . Fluticasone-Salmeterol (ADVAIR) 250-50 MCG/DOSE AEPB Inhale 1 puff into the lungs 2 (two) times daily.  Marland Kitchen gabapentin (NEURONTIN) 800 MG tablet Take 1 tablet (800 mg total) by mouth 4 (four) times daily.  . hydrochlorothiazide (HYDRODIURIL) 12.5 MG tablet Take 1 tablet (12.5 mg total) by mouth daily. In am  . HYDROcodone-acetaminophen (NORCO/VICODIN) 5-325 MG tablet Take 1 tablet by mouth every 8 (eight) hours as needed for severe pain.  Derrill Memo ON 05/08/2018] HYDROcodone-acetaminophen (NORCO/VICODIN) 5-325 MG tablet Take 1 tablet by mouth every 8 (eight) hours as needed for severe pain.  Derrill Memo ON 04/08/2018] HYDROcodone-acetaminophen (NORCO/VICODIN) 5-325 MG tablet Take 1 tablet by mouth every 8 (eight) hours as needed for severe pain.  Marland Kitchen ipratropium-albuterol (DUONEB) 0.5-2.5 (3) MG/3ML SOLN Take 3 mLs by nebulization 3 (three) times daily as needed.  Marland Kitchen levocetirizine (XYZAL) 5 MG tablet TAKE 1 TABLET BY MOUTH EVERY EVENING  . levothyroxine (SYNTHROID, LEVOTHROID) 175 MCG tablet Take 1 tablet (175 mcg total) by mouth daily before breakfast.  . meloxicam (MOBIC) 15 MG tablet TAKE ONE TABLET BY MOUTH EVERY DAY WITH A MEAL  . metFORMIN (GLUCOPHAGE) 1000 MG tablet TAKE ONE TABLET BY MOUTH TWICE DAILY (Patient taking  differently: TAKE ONE TABLET IN THE MORNING AND HALF TABLET AT NIGHT)  . metoprolol succinate (TOPROL-XL) 100 MG 24 hr tablet Take 1 tablet (100 mg total) by mouth daily. Take with or immediately following a meal.  . mirabegron ER (MYRBETRIQ) 50 MG TB24 tablet Take 1 tablet (50 mg total) by mouth daily.  . montelukast (SINGULAIR) 10 MG tablet Take 1 tablet (10 mg total) by mouth daily.  . Multiple Vitamin (MULTIVITAMIN WITH MINERALS) TABS tablet Take 1 tablet by mouth daily.  . mupirocin ointment (BACTROBAN) 2 % Apply 1 application topically 2 (two) times daily. Skin wounds  . nystatin cream (MYCOSTATIN) Apply 1 application topically 2 (two) times daily. Mouth prn for 7 days as needed  . oxybutynin (DITROPAN XL) 15 MG 24 hr tablet TAKE ONE TABLET BY MOUTH AT BEDTIME  . quinapril (ACCUPRIL) 40 MG tablet Take 1 tablet (40 mg total) by mouth daily.  . Tiotropium Bromide Monohydrate (SPIRIVA RESPIMAT) 1.25 MCG/ACT AERS Inhale 2 puffs into the lungs daily.  Marland Kitchen zinc oxide 20 % ointment Apply 1 application topically as needed for irritation.   Current Facility-Administered Medications for the 03/09/18 encounter (Office Visit) with [name], [name], MD  Medication  . ipratropium-albuterol (DUONEB) 0.5-2.5 (3) MG/3ML nebulizer solution 3 mL   Allergies  Allergen Reactions  . Penicillins Hives, Shortness Of Breath and  Swelling    Facial swelling Has patient had a PCN reaction causing immediate rash, facial/tongue/throat swelling, SOB or lightheadedness with hypotension: Yes Has patient had a PCN reaction causing severe rash involving mucus membranes or skin necrosis: Yes Has patient had a PCN reaction that required hospitalization No Has patient had a PCN reaction occurring within the last 10 years: No If all of the above answers are "NO", then may proceed with Cephalosporin use.   . Aspirin Hives  . Morphine And Related Other (See Comments)    Patient becomes very confused  . Terramycin  [Oxytetracycline] Hives   Recent Results (from the past 2160 hour(s))  Bladder Scan (Post Void Residual) in office     Status: None   Collection Time: 12/13/17  2:50 PM  Result Value Ref Range   Scan Result 41   Urinalysis, Complete     Status: Abnormal   Collection Time: 12/13/17  3:49 PM  Result Value Ref Range   Specific Gravity, UA >1.030 (H) 1.005 - 1.030   pH, UA 5.5 5.0 - 7.5   Color, UA Yellow Yellow   Appearance Ur Clear Clear   Leukocytes, UA Negative Negative   Protein, UA Negative Negative/Trace   Glucose, UA Negative Negative   Ketones, UA Trace (A) Negative   RBC, UA Negative Negative   Bilirubin, UA Negative Negative   Urobilinogen, Ur 0.2 0.2 - 1.0 mg/dL   Nitrite, UA Negative Negative   Microscopic Examination See below:   Microscopic Examination     Status: Abnormal   Collection Time: 12/13/17  3:49 PM  Result Value Ref Range   WBC, UA 11-30 (A) 0 - 5 /hpf   RBC, UA None seen 0 - 2 /hpf   Epithelial Cells (non renal) 0-10 0 - 10 /hpf   Casts Present (A) None seen /lpf   Cast Type Hyaline casts N/A    Comment: Granular casts   Bacteria, UA Few None seen/Few  CULTURE, URINE COMPREHENSIVE     Status: None   Collection Time: 12/13/17  5:18 PM  Result Value Ref Range   Urine Culture, Comprehensive Final report    Organism ID, Bacteria Comment     Comment: Mixed urogenital flora 10,000-25,000 colony forming units per mL   Urinalysis, Complete     Status: Abnormal   Collection Time: 12/27/17  1:41 PM  Result Value Ref Range   Specific Gravity, UA >1.030 (H) 1.005 - 1.030   pH, UA 5.0 5.0 - 7.5   Color, UA Yellow Yellow   Appearance Ur Clear Clear   Leukocytes, UA Negative Negative   Protein, UA Negative Negative/Trace   Glucose, UA Negative Negative   Ketones, UA Negative Negative   RBC, UA Trace (A) Negative   Bilirubin, UA Negative Negative   Urobilinogen, Ur 0.2 0.2 - 1.0 mg/dL   Nitrite, UA Negative Negative   Microscopic Examination See below:    Microscopic Examination     Status: Abnormal   Collection Time: 12/27/17  1:41 PM  Result Value Ref Range   WBC, UA 0-5 0 - 5 /hpf   RBC, UA 3-10 (A) 0 - 2 /hpf   Epithelial Cells (non renal) 0-10 0 - 10 /hpf   Casts Present (A) None seen /lpf   Cast Type Hyaline casts N/A   Mucus, UA Present (A) Not Estab.   Bacteria, UA Moderate (A) None seen/Few  CULTURE, URINE COMPREHENSIVE     Status: Abnormal   Collection Time: 12/27/17  2:00  PM  Result Value Ref Range   Urine Culture, Comprehensive Final report (A)    Organism ID, Bacteria Enterococcus faecalis (A)     Comment: 10,000-25,000 colony forming units per mL   Organism ID, Bacteria Comment     Comment: Mixed urogenital flora 1,000 Colonies/mL    ANTIMICROBIAL SUSCEPTIBILITY Comment     Comment:       ** S = Susceptible; I = Intermediate; R = Resistant **                    P = Positive; N = Negative             MICS are expressed in micrograms per mL    Antibiotic                 RSLT#1    RSLT#2    RSLT#3    RSLT#4 Ciprofloxacin                  R Levofloxacin                   R Nitrofurantoin                 S Penicillin                     S Tetracycline                   R Vancomycin                     S   Urinalysis, Complete     Status: Abnormal   Collection Time: 01/18/18  2:42 PM  Result Value Ref Range   Specific Gravity, UA >1.030 (H) 1.005 - 1.030   pH, UA 5.0 5.0 - 7.5   Color, UA Yellow Yellow   Appearance Ur Clear Clear   Leukocytes, UA Negative Negative   Protein, UA Negative Negative/Trace   Glucose, UA Negative Negative   Ketones, UA Negative Negative   RBC, UA Negative Negative   Bilirubin, UA Negative Negative   Urobilinogen, Ur 0.2 0.2 - 1.0 mg/dL   Nitrite, UA Negative Negative   Microscopic Examination See below:   Microscopic Examination     Status: Abnormal   Collection Time: 01/18/18  2:42 PM  Result Value Ref Range   WBC, UA None seen 0 - 5 /hpf   RBC, UA None seen 0 - 2 /hpf    Epithelial Cells (non renal) 0-10 0 - 10 /hpf   Bacteria, UA Moderate (A) None seen/Few  Urinalysis, Complete     Status: None   Collection Time: 02/21/18  9:04 AM  Result Value Ref Range   Specific Gravity, UA 1.025 1.005 - 1.030   pH, UA 5.0 5.0 - 7.5   Color, UA Yellow Yellow   Appearance Ur Clear Clear   Leukocytes, UA Negative Negative   Protein, UA Negative Negative/Trace   Glucose, UA Negative Negative   Ketones, UA Negative Negative   RBC, UA Negative Negative   Bilirubin, UA Negative Negative   Urobilinogen, Ur 0.2 0.2 - 1.0 mg/dL   Nitrite, UA Negative Negative   Microscopic Examination See below:   Microscopic Examination     Status: Abnormal   Collection Time: 02/21/18  9:04 AM  Result Value Ref Range   WBC, UA None seen 0 - 5 /hpf   RBC, UA None seen 0 - 2 /hpf  Epithelial Cells (non renal) None seen 0 - 10 /hpf   Casts Present (A) None seen /lpf   Cast Type Hyaline casts N/A   Mucus, UA Present (A) Not Estab.   Bacteria, UA Moderate (A) None seen/Few  CULTURE, URINE COMPREHENSIVE     Status: None   Collection Time: 02/21/18 11:45 AM  Result Value Ref Range   Urine Culture, Comprehensive Final report    Organism ID, Bacteria Lactobacillus species     Comment: 10,000-25,000 colony forming units per mL Susceptibility not normally performed on this organism.    Objective  Body mass index is 41.93 kg/m. Wt Readings from Last 3 Encounters:  02/22/18 252 lb (114.3 kg)  02/21/18 252 lb (114.3 kg)  01/18/18 262 lb 3.2 oz (118.9 kg)   Temp Readings from Last 3 Encounters:  03/09/18 97.7 F (36.5 C) (Oral)  02/22/18 97.7 F (36.5 C)  12/31/17 98.2 F (36.8 C) (Oral)   BP Readings from Last 3 Encounters:  03/09/18 130/62  02/22/18 (!) 138/57  02/21/18 112/69   Pulse Readings from Last 3 Encounters:  03/09/18 (!) 58  02/22/18 (!) 52  02/21/18 (!) 49    Physical Exam Vitals signs and nursing note reviewed.  Constitutional:      Appearance: Normal  appearance.  HENT:     Head: Normocephalic and atraumatic.     Mouth/Throat:     Mouth: Mucous membranes are moist.     Pharynx: Oropharynx is clear.  Eyes:     Conjunctiva/sclera: Conjunctivae normal.     Pupils: Pupils are equal, round, and reactive to light.  Cardiovascular:     Rate and Rhythm: Normal rate and regular rhythm.     Heart sounds: Normal heart sounds.  Pulmonary:     Effort: Pulmonary effort is normal.     Breath sounds: Normal breath sounds.  Skin:    General: Skin is warm and dry.  Neurological:     General: No focal deficit present.     Mental Status: She is alert and oriented to person, place, and time.     Comments: Using rolling walker today    Psychiatric:        Attention and Perception: Attention and perception normal.        Mood and Affect: Mood and affect normal.        Speech: Speech normal.        Behavior: Behavior normal. Behavior is cooperative.        Thought Content: Thought content normal.        Cognition and Memory: Cognition and memory normal.        Judgment: Judgment normal.     Assessment   1. HTN  2. Left chest pain r/o cardiac could be anxiety related not reproducible  3. Left heel and 2nd toe DM foot wound  4. DM 2  5. HM Plan   1. Controlled cont meds  2. Refer to Dr. Fletcher Anon consider stress test  3. F/u podiatry Dr. [name] and [facility] Dr. Lucky Cowboy had 7 skin grafts already  4. Check fasting labs 05/10/18  Check with pharmacy about metformin  5.  Flu shot 02/02/18 utd pna 23, prevnar  Tdap today given DM foot wound  consider shingrix in future Given Rx twinrixin pastto get at pharmacy h/o fatty liver  DEXA 10/17/14 normal Out of age window pap Ob/gyn mammo neg5/20/19 negative Colonoscopy h/o polyps 02/22/17 prep not good needs to f/u and resch with [place] 05/31/17 Had  due in 5 years from 05/31/17 [facility] GI colonoscopy with diverticulosis and polyps   Provider: Dr. Olivia Mackie [name]-Internal Medicine

## 2018-03-10 DIAGNOSIS — R351 Nocturia: Secondary | ICD-10-CM | POA: Diagnosis not present

## 2018-03-10 DIAGNOSIS — N3946 Mixed incontinence: Secondary | ICD-10-CM | POA: Diagnosis not present

## 2018-03-17 ENCOUNTER — Other Ambulatory Visit: Payer: Self-pay

## 2018-03-18 ENCOUNTER — Other Ambulatory Visit (INDEPENDENT_AMBULATORY_CARE_PROVIDER_SITE_OTHER): Payer: Self-pay | Admitting: Nurse Practitioner

## 2018-03-18 DIAGNOSIS — L97529 Non-pressure chronic ulcer of other part of left foot with unspecified severity: Secondary | ICD-10-CM

## 2018-03-21 ENCOUNTER — Ambulatory Visit (INDEPENDENT_AMBULATORY_CARE_PROVIDER_SITE_OTHER): Payer: PPO | Admitting: Nurse Practitioner

## 2018-03-21 ENCOUNTER — Telehealth (INDEPENDENT_AMBULATORY_CARE_PROVIDER_SITE_OTHER): Payer: Self-pay

## 2018-03-21 ENCOUNTER — Encounter (INDEPENDENT_AMBULATORY_CARE_PROVIDER_SITE_OTHER): Payer: Self-pay

## 2018-03-21 ENCOUNTER — Encounter (INDEPENDENT_AMBULATORY_CARE_PROVIDER_SITE_OTHER): Payer: Self-pay | Admitting: Nurse Practitioner

## 2018-03-21 ENCOUNTER — Ambulatory Visit (INDEPENDENT_AMBULATORY_CARE_PROVIDER_SITE_OTHER): Payer: PPO

## 2018-03-21 VITALS — BP 156/81 | HR 54 | Resp 16 | Ht 66.0 in | Wt 261.4 lb

## 2018-03-21 DIAGNOSIS — L97529 Non-pressure chronic ulcer of other part of left foot with unspecified severity: Secondary | ICD-10-CM

## 2018-03-21 DIAGNOSIS — I10 Essential (primary) hypertension: Secondary | ICD-10-CM | POA: Diagnosis not present

## 2018-03-21 DIAGNOSIS — E1142 Type 2 diabetes mellitus with diabetic polyneuropathy: Secondary | ICD-10-CM | POA: Diagnosis not present

## 2018-03-21 DIAGNOSIS — L97521 Non-pressure chronic ulcer of other part of left foot limited to breakdown of skin: Secondary | ICD-10-CM | POA: Diagnosis not present

## 2018-03-21 DIAGNOSIS — E1351 Other specified diabetes mellitus with diabetic peripheral angiopathy without gangrene: Secondary | ICD-10-CM

## 2018-03-21 DIAGNOSIS — L97523 Non-pressure chronic ulcer of other part of left foot with necrosis of muscle: Secondary | ICD-10-CM

## 2018-03-21 DIAGNOSIS — E1143 Type 2 diabetes mellitus with diabetic autonomic (poly)neuropathy: Secondary | ICD-10-CM | POA: Diagnosis not present

## 2018-03-21 NOTE — Progress Notes (Signed)
Subjective:    Patient ID: [name], female    DOB: May 26, 1950, 67 y.o.   MRN: [id number] Chief Complaint  Patient presents with  . Follow-up    HPI  [name] is a 67 y.o. female that is referred to [place] by Dr. [name] due to left lower extremity ulceration which is been there since June 2018.  She has underwent multiple interventions in order to try to get this wound closed however not have been successful.  Despite these multiple interventions there is still an open draining wound.  Since September 2018, the patient has had approximately 7 skin graft to her heel.  The first look very promising however shortly thereafter the sutures popped on her wound and it was found that her Achilles tendon became necrotic which also need to be removed.  The patient reports having some claudication-like symptoms in her upper thighs, as well as multiple times following this year.  The patient counts approximately 17.  With her visit with Dr. [name] it was noted that she did not have pulses with a monophasic hand-held Doppler signal.  The patient denies amaurosis fugax, TIA there is no history of CVA. The patient denies past history of DVT, PE or superficial thrombophlebitis. No past history of vascular interventions   The patient underwent bilateral ABIs today which revealed an ABI of 1.21 on her right lower extremity.  ABI of 1.15 on her left lower extremity.  This is consistent with previous studies done on 06/13/2016.  She has triphasic tibial waveforms.  She has strong toe waveforms in her right digit, however she has dampened waveforms on her left lower extremity. Past Medical History:  Diagnosis Date  . Abnormal antibody titer   . Anxiety and depression   . Arthritis   . Asthma   . Cystocele   . Depression   . Diabetes ([place])   . DVT (deep venous thrombosis) (HCC)    Righ calf  . Dysrhythmia   . Edema   . GERD (gastroesophageal reflux disease)   . Heart murmur    Echocardiogram  June 2015: Mild MR (possible vegetation seen on TTE, not seen on TEE), normal LV size with moderate concentric LVH. Normal function EF 60-65%. Normal diastolic function. Mild LA dilation.  Marland Kitchen History of IBS   . History of methicillin resistant staphylococcus aureus (MRSA) 2008  . Hyperlipidemia   . Hypertension   . Hypothyroidism   . Insomnia   . Kidney stone    kidney stones with lithotripsy  . Neuropathy involving both lower extremities   . Obesity, Class II, BMI 35-39.9, with comorbidity   . Paroxysmal atrial fibrillation ([place]) 2007   In June 2015, Cardiac Event Monitor: Mostly SR/sinus arrhythmia with PVCs that are frequent. Short bursts of A. fib lasting several minutes;; CHA2DS2-VASc Score = 5 (age, Female, PVD, DM, HTN)  . Peripheral vascular disease ([place])   . Sleep apnea    use C-PAP  . Urinary incontinence   . Venous stasis dermatitis of both lower extremities     Past Surgical History:  Procedure Laterality Date  . ANKLE SURGERY Right   . APPLICATION OF WOUND VAC Left 06/27/2015   Procedure: APPLICATION OF WOUND VAC ( POSSIBLE ) ;  Surgeon: [name], MD;  Location: [facility] ORS;  Service: Vascular;  Laterality: Left;  . APPLICATION OF WOUND VAC Left 09/25/2016   Procedure: APPLICATION OF WOUND VAC;  Surgeon: [name], DPM;  Location: [facility] ORS;  Service: Podiatry;  Laterality:  Left;  . arm surgery     right    . CHOLECYSTECTOMY    . COLONOSCOPY    . COLONOSCOPY WITH PROPOFOL N/A 01/11/2017   Procedure: COLONOSCOPY WITH PROPOFOL;  Surgeon: [name], MD;  Location: [facility] ENDOSCOPY;  Service: Endoscopy;  Laterality: N/A;  . COLONOSCOPY WITH PROPOFOL N/A 02/22/2017   Procedure: COLONOSCOPY WITH PROPOFOL;  Surgeon: [name], MD;  Location: [facility] ENDOSCOPY;  Service: Endoscopy;  Laterality: N/A;  . COLONOSCOPY WITH PROPOFOL N/A 05/31/2017   Procedure: COLONOSCOPY WITH PROPOFOL;  Surgeon: [name], MD;  Location: [facility] ENDOSCOPY;  Service: Endoscopy;   Laterality: N/A;  . ESOPHAGOGASTRODUODENOSCOPY (EGD) WITH PROPOFOL N/A 01/11/2017   Procedure: ESOPHAGOGASTRODUODENOSCOPY (EGD) WITH PROPOFOL;  Surgeon: [name], MD;  Location: [facility] ENDOSCOPY;  Service: Endoscopy;  Laterality: N/A;  . HERNIA REPAIR     umbilical  . HIATAL HERNIA REPAIR    . I&D EXTREMITY Left 06/27/2015   Procedure: IRRIGATION AND DEBRIDEMENT EXTREMITY            ( CALF HEMATOMA ) POSSIBLE WOUND VAC;  Surgeon: [name], MD;  Location: [facility] ORS;  Service: Vascular;  Laterality: Left;  . INCISION AND DRAINAGE OF WOUND Left 09/25/2016   Procedure: IRRIGATION AND DEBRIDEMENT - PARTIAL RESECTION OF ACHILLES TENDON WITH WOUND VAC APPLICATION;  Surgeon: [name], DPM;  Location: [facility] ORS;  Service: Podiatry;  Laterality: Left;  . IRRIGATION AND DEBRIDEMENT ABSCESS Left 09/07/2016   Procedure: IRRIGATION AND DEBRIDEMENT ABSCESS LEFT HEEL;  Surgeon: [name], DPM;  Location: [facility] ORS;  Service: Podiatry;  Laterality: Left;  . JOINT REPLACEMENT  2013   left knee replacement  . KIDNEY SURGERY Right    kidney stones  . LITHOTRIPSY    . NM [facility] HX)  February 2017   Likely breast attenuation. LOW RISK study. Normal EF 55-60%.  . OTHER SURGICAL HISTORY     08/2016 or 09/2016 surgery on achilles tenso h/o staph infection heal. Dr. [name]  . removal of left hematoma Left    leg  . REVERSE SHOULDER ARTHROPLASTY Right 10/08/2015   Procedure: REVERSE SHOULDER ARTHROPLASTY;  Surgeon: [name], MD;  Location: [facility] ORS;  Service: Orthopedics;  Laterality: Right;  . SLEEVE GASTROPLASTY    . TONSILLECTOMY    . TOTAL KNEE ARTHROPLASTY    . TOTAL SHOULDER REPLACEMENT Right   . TRANSESOPHAGEAL ECHOCARDIOGRAM  08/08/2013   Mild LVH, EF 60-65%. Moderate LA dilation and mild RA dilation. Mild MR with no evidence of stenosis and no evidence of endocarditis. A false chordae is noted.  . TRANSTHORACIC ECHOCARDIOGRAM  08/03/2013   Mild-Moderate concentric LVH,  EF 60-65%. Normal diastolic function. Mild LA dilation. Mild MR with possible vegetation  - not confirmed on TEE   . ULNAR NERVE TRANSPOSITION Right 08/06/2016   Procedure: ULNAR NERVE DECOMPRESSION/TRANSPOSITION;  Surgeon: [name], MD;  Location: [facility] ORS;  Service: Orthopedics;  Laterality: Right;  . UPPER GI ENDOSCOPY    . VAGINAL HYSTERECTOMY      Social History   Socioeconomic History  . Marital status: Married    Spouse name: Not on file  . Number of children: Not on file  . Years of education: Not on file  . Highest education level: Not on file  Occupational History  . Not on file  Social Needs  . Financial resource strain: Not on file  . Food insecurity:    Worry: Not on file    Inability:  Not on file  . Transportation needs:    Medical: Not on file    Non-medical: Not on file  Tobacco Use  . Smoking status: Never Smoker  . Smokeless tobacco: Never Used  Substance and Sexual Activity  . Alcohol use: No    Alcohol/week: 0.0 standard drinks  . Drug use: No  . Sexual activity: Yes  Lifestyle  . Physical activity:    Days per week: Not on file    Minutes per session: Not on file  . Stress: Not on file  Relationships  . Social connections:    Talks on phone: Not on file    Gets together: Not on file    Attends religious service: Not on file    Active member of club or organization: Not on file    Attends meetings of clubs or organizations: Not on file    Relationship status: Not on file  . Intimate partner violence:    Fear of current or ex partner: Not on file    Emotionally abused: Not on file    Physically abused: Not on file    Forced sexual activity: Not on file  Other Topics Concern  . Not on file  Social History Narrative   She is currently married -- for 30+ years. Does not work. Does not smoke or take alcohol. She never smoked. She exercises at least 3 days a week since before her gastric surgery.   Marital status reviewed in history of present  illness.   She has children and grandchildren     Family History  Problem Relation Age of Onset  . Skin cancer Father   . Diabetes Father   . Hypertension Father   . Peripheral vascular disease Father   . Cancer Father   . Cerebral aneurysm Father   . Alcohol abuse Father   . Varicose Veins Mother   . Kidney disease Mother   . Arthritis Mother   . Mental illness Sister   . Cancer Maternal Aunt        breast  . Breast cancer Maternal Aunt   . Arthritis Maternal Grandmother   . Hypertension Maternal Grandmother   . Diabetes Maternal Grandmother   . Arthritis Maternal Grandfather   . Heart disease Maternal Grandfather   . Hypertension Maternal Grandfather   . Arthritis Paternal Grandmother   . Hypertension Paternal Grandmother   . Diabetes Paternal Grandmother   . Arthritis Paternal Grandfather   . Hypertension Paternal Grandfather   . Bladder Cancer Neg Hx   . Kidney cancer Neg Hx     Allergies  Allergen Reactions  . Penicillins Hives, Shortness Of Breath and Swelling    Facial swelling Has patient had a PCN reaction causing immediate rash, facial/tongue/throat swelling, SOB or lightheadedness with hypotension: Yes Has patient had a PCN reaction causing severe rash involving mucus membranes or skin necrosis: Yes Has patient had a PCN reaction that required hospitalization No Has patient had a PCN reaction occurring within the last 10 years: No If all of the above answers are "NO", then may proceed with Cephalosporin use.   . Aspirin Hives  . Morphine And Related Other (See Comments)    Patient becomes very confused  . Terramycin [Oxytetracycline] Hives     Review of Systems   Review of Systems: Negative Unless Checked Constitutional: [] Weight loss  [] Fever  [] Chills Cardiac: [] Chest pain   []  Atrial Fibrillation  [] Palpitations   [] Shortness of breath when laying flat   [] Shortness  of breath with exertion. [] Shortness of breath at rest Vascular:  [x] Pain in legs  with walking   [x] Pain in legs with standing [] Pain in legs when laying flat   [] Claudication    [] Pain in feet when laying flat    [] History of DVT   [] Phlebitis   [] Swelling in legs   [] Varicose veins   [] Non-healing ulcers Pulmonary:   [] Uses home oxygen   [] Productive cough   [] Hemoptysis   [] Wheeze  [] COPD   [] Asthma Neurologic:  [] Dizziness   [] Seizures  [] Blackouts [] History of stroke   [] History of TIA  [] Aphasia   [] Temporary Blindness   [] Weakness or numbness in arm   [] Weakness or numbness in leg Musculoskeletal:   [x] Joint swelling   [x] Joint pain   [] Low back pain  []  History of Knee Replacement [] Arthritis [] back Surgeries  []  Spinal Stenosis    Hematologic:  [] Easy bruising  [] Easy bleeding   [] Hypercoagulable state   [x] Anemic Gastrointestinal:  [] Diarrhea   [] Vomiting  [x] Gastroesophageal reflux/heartburn   [] Difficulty swallowing. [] Abdominal pain Genitourinary:  [] Chronic kidney disease   [] Difficult urination  [] Anuric   [] Blood in urine [] Frequent urination  [] Burning with urination   [] Hematuria Skin:  [] Rashes   [] Ulcers [] Wounds Psychological:  [] History of anxiety   []  History of major depression  []  Memory Difficulties     Objective:   Physical Exam  BP (!) 156/81 (BP Location: Right Arm, Patient Position: Sitting)   Pulse (!) 54   Resp 16   Ht 5\' 6"  (1.676 m)   Wt 261 lb 6.4 oz (118.6 kg)   BMI 42.19 kg/m   Gen: WD/WN, NAD Head: [place]/AT, No temporalis wasting.  Ear/Nose/Throat: Hearing grossly intact, nares w/o erythema or drainage Eyes: PER, EOMI, sclera nonicteric.  Neck: Supple, no masses.  No JVD.  Pulmonary:  Good air movement, no use of accessory muscles.  Cardiac: RRR Vascular:  Vessel Right Left  Radial Palpable Palpable  Dorsalis Pedis  not palpable  not palpable  Posterior Tibial  not palpable  not palpable   Gastrointestinal: soft, non-distended. No guarding/no peritoneal signs.  Musculoskeletal: Uses walker for ambulation No deformity or  atrophy.  Neurologic: Pain and light touch intact in extremities.  Symmetrical.  Speech is fluent. Motor exam as listed above. Psychiatric: Judgment intact, Mood & affect appropriate for pt's clinical situation. Dermatologic: No Venous rashes.  Heel ulcer. No changes consistent with cellulitis. Lymph : No Cervical lymphadenopathy, no lichenification or skin changes of chronic lymphedema.      Assessment & Plan:   1. Skin ulcer of left foot with necrosis of muscle ([place]) Patient will continue to follow with podiatry for wound care.  The patient has had multiple skin grafts.  We will continue to defer wound care to podiatry.  2. Diabetic peripheral neuropathy associated with type 2 diabetes mellitus ([place]) Continue hypoglycemic medications as already ordered, these medications have been reviewed and there are no changes at this time.  Hgb A1C to be monitored as already arranged by primary service   3. Essential hypertension Continue antihypertensive medications as already ordered, these medications have been reviewed and there are no changes at this time.   4. Peripheral vascular disease due to secondary diabetes mellitus ([place]) The patient underwent bilateral ABIs today which revealed an ABI of 1.21 on her right lower extremity.  ABI of 1.15 on her left lower extremity.  This is consistent with previous studies done on 06/13/2016.  She has triphasic tibial waveforms.  She  has strong toe waveforms in her right digit, however she has dampened waveforms on her left lower extremity.  The patient has ABIs within normal limits however she does not have palpable pulses in her lower extremities.  This suggests some components of medial calcification, which may suggest that her ABIs are falsely elevated.  The patient has had this wound for approximately 18 months at this point, with approximately 7 skin grafts.  Due to the delayed healing of her lower extremity wound I feel it is prudent for [place] to  perform a lower extremity angiogram to definitively rule out any peripheral vascular disease that may be intervened upon as well as examine the possibility of microvascular disease causing decreased wound healing.  I discussed with the patient that this may be purely diagnostic or if Dr. dew is able to intervene on a vessel during the time of the procedure he will.   Recommend:  The patient has evidence of severe atherosclerotic changes of both lower extremities associated with ulceration and tissue loss of the foot.  This represents a limb threatening ischemia and places the patient at the risk for limb loss.  Patient should undergo angiography of the lower extremities with the hope for intervention for limb salvage.  The risks and benefits as well as the alternative therapies was discussed in detail with the patient.  All questions were answered.  Patient agrees to proceed with angiography.  The patient will follow up with me in the office after the procedure.    Current Outpatient Medications on File Prior to Visit  Medication Sig Dispense Refill  . albuterol (PROVENTIL HFA;VENTOLIN HFA) 108 (90 Base) MCG/ACT inhaler Inhale 1-2 puffs into the lungs every 4 (four) hours as needed for wheezing or shortness of breath. (Patient taking differently: Inhale 2 puffs into the lungs every 4 (four) hours as needed for wheezing or shortness of breath. ) 8 g 2  . albuterol (PROVENTIL) (2.5 MG/3ML) 0.083% nebulizer solution Take 3 mLs (2.5 mg total) by nebulization every 6 (six) hours as needed for wheezing or shortness of breath. 150 mL 1  . ALPRAZolam (XANAX) 0.5 MG tablet Take 1 tablet (0.5 mg total) by mouth 2 (two) times daily as needed for anxiety. 60 tablet 2  . amLODipine (NORVASC) 5 MG tablet Take 1 tablet (5 mg total) by mouth daily. 90 tablet 3  . buPROPion (WELLBUTRIN XL) 300 MG 24 hr tablet Take 1 tablet (300 mg total) by mouth daily. 30 tablet 11  . clobetasol cream (TEMOVATE) 7.34 % Apply 1  application topically 2 (two) times daily. To arms 60 g 1  . dabigatran (PRADAXA) 150 MG CAPS capsule Take 1 capsule (150 mg total) by mouth 2 (two) times daily. 180 capsule 2  . dexlansoprazole (DEXILANT) 60 MG capsule Take 1 capsule (60 mg total) by mouth daily. Minutes before meal (lunch) 90 capsule 1  . diclofenac sodium (VOLTAREN) 1 % GEL Apply 1 application topically as needed.    . DULoxetine (CYMBALTA) 60 MG capsule Take 1 capsule (60 mg total) by mouth daily. 90 capsule 0  . estradiol (ESTRACE) 0.1 MG/GM vaginal cream Apply 0.5mg  (pea-sized amount)  just inside the vaginal introitus with a finger-tip on Monday, Wednesday and Friday nights, 42.5 g 11  . Fluticasone-Salmeterol (ADVAIR) 250-50 MCG/DOSE AEPB Inhale 1 puff into the lungs 2 (two) times daily.    Marland Kitchen gabapentin (NEURONTIN) 800 MG tablet Take 1 tablet (800 mg total) by mouth 4 (four) times daily. 360 tablet 0  .  hydrochlorothiazide (HYDRODIURIL) 12.5 MG tablet Take 1 tablet (12.5 mg total) by mouth daily. In am 90 tablet 1  . [START ON 05/08/2018] HYDROcodone-acetaminophen (NORCO/VICODIN) 5-325 MG tablet Take 1 tablet by mouth every 8 (eight) hours as needed for severe pain. 90 tablet 0  . [START ON 04/08/2018] HYDROcodone-acetaminophen (NORCO/VICODIN) 5-325 MG tablet Take 1 tablet by mouth every 8 (eight) hours as needed for severe pain. 90 tablet 0  . levocetirizine (XYZAL) 5 MG tablet TAKE 1 TABLET BY MOUTH EVERY EVENING 90 tablet 3  . levothyroxine (SYNTHROID, LEVOTHROID) 175 MCG tablet Take 1 tablet (175 mcg total) by mouth daily before breakfast. 90 tablet 3  . meloxicam (MOBIC) 15 MG tablet TAKE ONE TABLET BY MOUTH EVERY DAY WITH A MEAL 90 tablet 0  . metFORMIN (GLUCOPHAGE) 1000 MG tablet TAKE ONE TABLET BY MOUTH TWICE DAILY (Patient taking differently: TAKE ONE TABLET IN THE MORNING AND HALF TABLET AT NIGHT) 180 tablet 1  . metoprolol succinate (TOPROL-XL) 100 MG 24 hr tablet Take 1 tablet (100 mg total) by mouth daily. Take with  or immediately following a meal. 90 tablet 3  . mirabegron ER (MYRBETRIQ) 50 MG TB24 tablet Take 1 tablet (50 mg total) by mouth daily. 30 tablet 6  . montelukast (SINGULAIR) 10 MG tablet Take 1 tablet (10 mg total) by mouth daily. 90 tablet 3  . mupirocin ointment (BACTROBAN) 2 % Apply 1 application topically 2 (two) times daily. Skin wounds 30 g 0  . nystatin cream (MYCOSTATIN) Apply 1 application topically 2 (two) times daily. Mouth prn for 7 days as needed 30 g 11  . oxybutynin (DITROPAN XL) 15 MG 24 hr tablet TAKE ONE TABLET BY MOUTH AT BEDTIME 90 tablet 3  . quinapril (ACCUPRIL) 40 MG tablet Take 1 tablet (40 mg total) by mouth daily. 90 tablet 3  . Tiotropium Bromide Monohydrate (SPIRIVA RESPIMAT) 1.25 MCG/ACT AERS Inhale 2 puffs into the lungs daily. 1 Inhaler 11  . zinc oxide 20 % ointment Apply 1 application topically as needed for irritation. 56.7 g 0   Current Facility-Administered Medications on File Prior to Visit  Medication Dose Route Frequency Provider Last Rate Last Dose  . ipratropium-albuterol (DUONEB) 0.5-2.5 (3) MG/3ML nebulizer solution 3 mL  3 mL Nebulization Q6H [name], [name], MD        There are no Patient Instructions on file for this visit. No follow-ups on file.   Kris Hartmann, NP  This note was completed with Sales executive.  Any errors are purely unintentional.

## 2018-03-21 NOTE — Telephone Encounter (Signed)
The patient was called to discuss her angio day and time. I left a message for a return call. The patient is scheduled for 04/04/18 with Dr. Lucky Cowboy with a 8:15 arrival time and pre-op is scheduled for 04/01/18 @ 8:00 am. The paperwork with instructions will be mailed out to the patient.

## 2018-03-22 NOTE — Telephone Encounter (Signed)
Patient called today and I went over her pre-procedure instructions as well as gave the patient the day/time of her procedure and pre-op. This information has also been mailed out.

## 2018-03-24 ENCOUNTER — Encounter: Payer: Self-pay | Admitting: Cardiovascular Disease

## 2018-03-24 ENCOUNTER — Ambulatory Visit (INDEPENDENT_AMBULATORY_CARE_PROVIDER_SITE_OTHER): Payer: PPO | Admitting: Cardiovascular Disease

## 2018-03-24 VITALS — BP 138/60 | HR 47 | Ht 65.5 in | Wt 267.8 lb

## 2018-03-24 DIAGNOSIS — I480 Paroxysmal atrial fibrillation: Secondary | ICD-10-CM | POA: Diagnosis not present

## 2018-03-24 DIAGNOSIS — R079 Chest pain, unspecified: Secondary | ICD-10-CM | POA: Diagnosis not present

## 2018-03-24 DIAGNOSIS — I10 Essential (primary) hypertension: Secondary | ICD-10-CM | POA: Diagnosis not present

## 2018-03-24 MED ORDER — METOPROLOL SUCCINATE ER 50 MG PO TB24: 50.0000 mg | ORAL_TABLET | Freq: Every day | ORAL | 3 refills | Status: DC

## 2018-03-24 NOTE — Patient Instructions (Signed)
Medication Instructions:  Your physician has recommended you make the following change in your medication:  1. DECREASE Metoprolol succinate to 50 mg once daily.  If you need a refill on your cardiac medications before your next appointment, please call your pharmacy.   Lab work: None If you have labs (blood work) drawn today and your tests are completely normal, you will receive your results only by: Marland Kitchen MyChart Message (if you have MyChart) OR . A paper copy in the mail If you have any lab test that is abnormal or we need to change your treatment, we will call you to review the results.  Testing/Procedures: [place]  Your caregiver has ordered a Stress Test with nuclear imaging. The purpose of this test is to evaluate the blood supply to your heart muscle. This procedure is referred to as a "Non-Invasive Stress Test." This is because other than having an IV started in your vein, nothing is inserted or "invades" your body. Cardiac stress tests are done to find areas of poor blood flow to the heart by determining the extent of coronary artery disease (CAD). Some patients exercise on a treadmill, which naturally increases the blood flow to your heart, while others who are  unable to walk on a treadmill due to physical limitations have a pharmacologic/chemical stress agent called Lexiscan . This medicine will mimic walking on a treadmill by temporarily increasing your coronary blood flow.   Please note: these test may take anywhere between 2-4 hours to complete  PLEASE REPORT TO [place] AT THE FIRST DESK WILL DIRECT YOU WHERE TO GO  Date of Procedure:_____________________________________  Arrival Time for Procedure:______________________________  Instructions regarding medication:   _XX___ : Hold diabetes medication morning of procedure  PLEASE NOTIFY THE OFFICE AT LEAST 24 HOURS IN ADVANCE IF YOU ARE UNABLE TO KEEP YOUR APPOINTMENT.   [phone] AND  PLEASE NOTIFY NUCLEAR MEDICINE AT [facility] AT LEAST 24 HOURS IN ADVANCE IF YOU ARE UNABLE TO KEEP YOUR APPOINTMENT. [phone]  How to prepare for your Myoview test:  1. Do not eat or drink after midnight 2. No caffeine for 24 hours prior to test 3. No smoking 24 hours prior to test. 4. Your medication may be taken with water.  If your doctor stopped a medication because of this test, do not take that medication. 5. Ladies, please do not wear dresses.  Skirts or pants are appropriate. Please wear a short sleeve shirt. 6. No perfume, cologne or lotion. 7. Wear comfortable walking shoes. No heels!       Follow-Up: At [facility], you and your health needs are our priority.  As part of our continuing mission to provide you with exceptional heart care, we have created designated Provider Care Teams.  These Care Teams include your primary Cardiologist (physician) and Advanced Practice Providers (APPs -  Physician Assistants and Nurse Practitioners) who all work together to provide you with the care you need, when you need it. You will need a follow up appointment in 6 months.  Please call our office 2 months in advance to schedule this appointment.  You may see Dr. Fletcher Anon or one of the following Advanced Practice Providers on your designated Care Team:   Murray Hodgkins, NP Christell Faith, PA-C . Marrianne Mood, PA-C  Any Other Special Instructions Will Be Listed Below (If Applicable).

## 2018-03-24 NOTE — Progress Notes (Signed)
Cardiology Office Note   Date:  03/24/2018   ID:  [name], DOB [birth year], MRN [id number]  PCP:  [name], [name], MD  Cardiologist:   Kathlyn Sacramento, MD   Chief Complaint  Patient presents with  . other    6 month follow up. Meds reviewed by the pt. verbally. Pt. c/o shortness of breath, pain down right leg and LE edema.       History of Present Illness: [name] is a 68 y.o. female who presents for a follow-up visit regarding paroxysmal atrial fibrillation. No previous history of ischemic heart disease. She had a nuclear stress test in February 2017 which was normal. She has chronic medical conditions that include diabetes mellitus, hypertension, hyperlipidemia, obesity and previous DVT. She also has sleep apnea and has been on CPAP for many years.   She has chronic left foot ulceration being managed by podiatry.  She had lower extremity arterial Doppler done multiple times which has been normal.  She had a repeat one last month at [facility] which showed normal ABI and toe pressure and triphasic waveforms distally.  The patient had chest pain last month left lasted for 2 to 3 days.  It was substernal described as sharp discomfort which lasted for about 5 to 10 minutes each day.  She has not been very active lately and gained about 6 pounds.  She reports worsening exertional dyspnea.  Past Medical History:  Diagnosis Date  . Abnormal antibody titer   . Anxiety and depression   . Arthritis   . Asthma   . Cystocele   . Depression   . Diabetes ([place])   . DVT (deep venous thrombosis) (HCC)    Righ calf  . Dysrhythmia   . Edema   . GERD (gastroesophageal reflux disease)   . Heart murmur    Echocardiogram June 2015: Mild MR (possible vegetation seen on TTE, not seen on TEE), normal LV size with moderate concentric LVH. Normal function EF 60-65%. Normal diastolic function. Mild LA dilation.  Marland Kitchen History of IBS   . History of methicillin resistant staphylococcus  aureus (MRSA) 2008  . Hyperlipidemia   . Hypertension   . Hypothyroidism   . Insomnia   . Kidney stone    kidney stones with lithotripsy  . Neuropathy involving both lower extremities   . Obesity, Class II, BMI 35-39.9, with comorbidity   . Paroxysmal atrial fibrillation ([place]) 2007   In June 2015, Cardiac Event Monitor: Mostly SR/sinus arrhythmia with PVCs that are frequent. Short bursts of A. fib lasting several minutes;; CHA2DS2-VASc Score = 5 (age, Female, PVD, DM, HTN)  . Peripheral vascular disease ([place])   . Sleep apnea    use C-PAP  . Urinary incontinence   . Venous stasis dermatitis of both lower extremities     Past Surgical History:  Procedure Laterality Date  . ANKLE SURGERY Right   . APPLICATION OF WOUND VAC Left 06/27/2015   Procedure: APPLICATION OF WOUND VAC ( POSSIBLE ) ;  Surgeon: [name], MD;  Location: [facility];  Service: Vascular;  Laterality: Left;  . APPLICATION OF WOUND VAC Left 09/25/2016   Procedure: APPLICATION OF WOUND VAC;  Surgeon: [name], DPM;  Location: [facility];  Service: Podiatry;  Laterality: Left;  . arm surgery     right    . CHOLECYSTECTOMY    . COLONOSCOPY    . COLONOSCOPY WITH PROPOFOL N/A 01/11/2017   Procedure: COLONOSCOPY WITH PROPOFOL;  Surgeon: Loistine Simas  U, MD;  Location: [facility] ENDOSCOPY;  Service: Endoscopy;  Laterality: N/A;  . COLONOSCOPY WITH PROPOFOL N/A 02/22/2017   Procedure: COLONOSCOPY WITH PROPOFOL;  Surgeon: [name], MD;  Location: [facility] ENDOSCOPY;  Service: Endoscopy;  Laterality: N/A;  . COLONOSCOPY WITH PROPOFOL N/A 05/31/2017   Procedure: COLONOSCOPY WITH PROPOFOL;  Surgeon: [name], MD;  Location: [facility] ENDOSCOPY;  Service: Endoscopy;  Laterality: N/A;  . ESOPHAGOGASTRODUODENOSCOPY (EGD) WITH PROPOFOL N/A 01/11/2017   Procedure: ESOPHAGOGASTRODUODENOSCOPY (EGD) WITH PROPOFOL;  Surgeon: [name], MD;  Location: [facility] ENDOSCOPY;  Service: Endoscopy;  Laterality: N/A;  . HERNIA  REPAIR     umbilical  . HIATAL HERNIA REPAIR    . I&D EXTREMITY Left 06/27/2015   Procedure: IRRIGATION AND DEBRIDEMENT EXTREMITY            ( CALF HEMATOMA ) POSSIBLE WOUND VAC;  Surgeon: [name], MD;  Location: [facility] ORS;  Service: Vascular;  Laterality: Left;  . INCISION AND DRAINAGE OF WOUND Left 09/25/2016   Procedure: IRRIGATION AND DEBRIDEMENT - PARTIAL RESECTION OF ACHILLES TENDON WITH WOUND VAC APPLICATION;  Surgeon: [name], DPM;  Location: [facility];  Service: Podiatry;  Laterality: Left;  . IRRIGATION AND DEBRIDEMENT ABSCESS Left 09/07/2016   Procedure: IRRIGATION AND DEBRIDEMENT ABSCESS LEFT HEEL;  Surgeon: [name], DPM;  Location: [facility];  Service: Podiatry;  Laterality: Left;  . JOINT REPLACEMENT  2013   left knee replacement  . KIDNEY SURGERY Right    kidney stones  . LITHOTRIPSY    . NM [facility] HX)  February 2017   Likely breast attenuation. LOW RISK study. Normal EF 55-60%.  . OTHER SURGICAL HISTORY     08/2016 or 09/2016 surgery on achilles tenso h/o staph infection heal. Dr. [name]  . removal of left hematoma Left    leg  . REVERSE SHOULDER ARTHROPLASTY Right 10/08/2015   Procedure: REVERSE SHOULDER ARTHROPLASTY;  Surgeon: [name], MD;  Location: [facility] ORS;  Service: Orthopedics;  Laterality: Right;  . SLEEVE GASTROPLASTY    . TONSILLECTOMY    . TOTAL KNEE ARTHROPLASTY    . TOTAL SHOULDER REPLACEMENT Right   . TRANSESOPHAGEAL ECHOCARDIOGRAM  08/08/2013   Mild LVH, EF 60-65%. Moderate LA dilation and mild RA dilation. Mild MR with no evidence of stenosis and no evidence of endocarditis. A false chordae is noted.  . TRANSTHORACIC ECHOCARDIOGRAM  08/03/2013   Mild-Moderate concentric LVH, EF 60-65%. Normal diastolic function. Mild LA dilation. Mild MR with possible vegetation  - not confirmed on TEE   . ULNAR NERVE TRANSPOSITION Right 08/06/2016   Procedure: ULNAR NERVE DECOMPRESSION/TRANSPOSITION;  Surgeon: [name], MD;  Location:  [facility] ORS;  Service: Orthopedics;  Laterality: Right;  . UPPER GI ENDOSCOPY    . VAGINAL HYSTERECTOMY       Current Outpatient Medications  Medication Sig Dispense Refill  . albuterol (PROVENTIL HFA;VENTOLIN HFA) 108 (90 Base) MCG/ACT inhaler Inhale 1-2 puffs into the lungs every 4 (four) hours as needed for wheezing or shortness of breath. (Patient taking differently: Inhale 2 puffs into the lungs every 4 (four) hours as needed for wheezing or shortness of breath. ) 8 g 2  . albuterol (PROVENTIL) (2.5 MG/3ML) 0.083% nebulizer solution Take 3 mLs (2.5 mg total) by nebulization every 6 (six) hours as needed for wheezing or shortness of breath. 150 mL 1  . ALPRAZolam (XANAX) 0.5 MG tablet Take 1 tablet (0.5 mg total) by mouth 2 (two) times daily as needed  for anxiety. 60 tablet 2  . amLODipine (NORVASC) 5 MG tablet Take 1 tablet (5 mg total) by mouth daily. 90 tablet 3  . buPROPion (WELLBUTRIN XL) 300 MG 24 hr tablet Take 1 tablet (300 mg total) by mouth daily. 30 tablet 11  . Calcium Citrate-Vitamin D (CALCIUM + D PO) Take 1 tablet by mouth 2 (two) times daily.    . clobetasol cream (TEMOVATE) 3.66 % Apply 1 application topically 2 (two) times daily. To arms (Patient taking differently: Apply 1 application topically 2 (two) times daily as needed (irritation). To arms) 60 g 1  . dabigatran (PRADAXA) 150 MG CAPS capsule Take 1 capsule (150 mg total) by mouth 2 (two) times daily. 180 capsule 2  . dexlansoprazole (DEXILANT) 60 MG capsule Take 1 capsule (60 mg total) by mouth daily. Minutes before meal (lunch) 90 capsule 1  . diclofenac sodium (VOLTAREN) 1 % GEL Apply 1 application topically daily as needed (pain).     . DULoxetine (CYMBALTA) 60 MG capsule Take 1 capsule (60 mg total) by mouth daily. 90 capsule 0  . estradiol (ESTRACE) 0.1 MG/GM vaginal cream Apply 0.5mg  (pea-sized amount)  just inside the vaginal introitus with a finger-tip on Monday, Wednesday and Friday nights, 42.5 g 11  .  Fluticasone-Salmeterol (ADVAIR) 250-50 MCG/DOSE AEPB Inhale 1 puff into the lungs 2 (two) times daily.    Marland Kitchen gabapentin (NEURONTIN) 800 MG tablet Take 1 tablet (800 mg total) by mouth 4 (four) times daily. 360 tablet 0  . hydrochlorothiazide (HYDRODIURIL) 12.5 MG tablet Take 1 tablet (12.5 mg total) by mouth daily. In am 90 tablet 1  . [START ON 04/08/2018] HYDROcodone-acetaminophen (NORCO/VICODIN) 5-325 MG tablet Take 1 tablet by mouth every 8 (eight) hours as needed for severe pain. 90 tablet 0  . levocetirizine (XYZAL) 5 MG tablet TAKE 1 TABLET BY MOUTH EVERY EVENING 90 tablet 3  . levothyroxine (SYNTHROID, LEVOTHROID) 175 MCG tablet Take 1 tablet (175 mcg total) by mouth daily before breakfast. 90 tablet 3  . meloxicam (MOBIC) 15 MG tablet TAKE ONE TABLET BY MOUTH EVERY DAY WITH A MEAL (Patient taking differently: Take 15 mg by mouth daily. ) 90 tablet 0  . metFORMIN (GLUCOPHAGE) 1000 MG tablet TAKE ONE TABLET BY MOUTH TWICE DAILY (Patient taking differently: Take 500-1,000 mg by mouth See admin instructions. 1000 mg in the morning, and 500 mg at bedtime) 180 tablet 1  . metoprolol succinate (TOPROL-XL) 100 MG 24 hr tablet Take 1 tablet (100 mg total) by mouth daily. Take with or immediately following a meal. 90 tablet 3  . mirabegron ER (MYRBETRIQ) 50 MG TB24 tablet Take 1 tablet (50 mg total) by mouth daily. 30 tablet 6  . montelukast (SINGULAIR) 10 MG tablet Take 1 tablet (10 mg total) by mouth daily. 90 tablet 3  . mupirocin ointment (BACTROBAN) 2 % Apply 1 application topically 2 (two) times daily. Skin wounds 30 g 0  . nystatin cream (MYCOSTATIN) Apply 1 application topically 2 (two) times daily. Mouth prn for 7 days as needed (Patient taking differently: Apply 1 application topically 2 (two) times daily as needed for dry skin. Mouth prn for 7 days as needed) 30 g 11  . oxybutynin (DITROPAN XL) 15 MG 24 hr tablet TAKE ONE TABLET BY MOUTH AT BEDTIME 90 tablet 3  . quinapril (ACCUPRIL) 40 MG  tablet Take 1 tablet (40 mg total) by mouth daily. 90 tablet 3  . Tiotropium Bromide Monohydrate (SPIRIVA RESPIMAT) 1.25 MCG/ACT AERS Inhale 2  puffs into the lungs daily. 1 Inhaler 11  . zinc oxide 20 % ointment Apply 1 application topically as needed for irritation. 56.7 g 0  . [START ON 05/08/2018] HYDROcodone-acetaminophen (NORCO/VICODIN) 5-325 MG tablet Take 1 tablet by mouth every 8 (eight) hours as needed for severe pain. (Patient not taking: Reported on 03/24/2018) 90 tablet 0   Current Facility-Administered Medications  Medication Dose Route Frequency Provider Last Rate Last Dose  . ipratropium-albuterol (DUONEB) 0.5-2.5 (3) MG/3ML nebulizer solution 3 mL  3 mL Nebulization Q6H [name], [name], MD        Allergies:   Penicillins; Aspirin; Morphine and related; and Terramycin [oxytetracycline]    Social History:  The patient  reports that she has never smoked. She has never used smokeless tobacco. She reports that she does not drink alcohol or use drugs.   Family History:  The patient's family history includes Alcohol abuse in her father; Arthritis in her maternal grandfather, maternal grandmother, mother, paternal grandfather, and paternal grandmother; Breast cancer in her maternal aunt; Cancer in her father and maternal aunt; Cerebral aneurysm in her father; Diabetes in her father, maternal grandmother, and paternal grandmother; Heart disease in her maternal grandfather; Hypertension in her father, maternal grandfather, maternal grandmother, paternal grandfather, and paternal grandmother; Kidney disease in her mother; Mental illness in her sister; Peripheral vascular disease in her father; Skin cancer in her father; Varicose Veins in her mother.    ROS:  Please see the history of present illness.   Otherwise, review of systems are positive for none.   All other systems are reviewed and negative.    PHYSICAL EXAM: VS:  BP 138/60 (BP Location: Left Arm, Patient Position: Sitting,  Cuff Size: Normal)   Pulse (!) 47   Ht 5' 5.5" (1.664 m)   Wt 267 lb 12 oz (121.5 kg)   BMI 43.88 kg/m  , BMI Body mass index is 43.88 kg/m. GEN: Well nourished, well developed, in no acute distress  HEENT: normal  Neck: no JVD, carotid bruits, or masses Cardiac: RRR; no murmurs, rubs, or gallops,no edema  Respiratory:  clear to auscultation bilaterally, normal work of breathing GI: soft, nontender, nondistended, + BS MS: no deformity or atrophy  Skin: warm and dry, no rash Neuro:  Strength and sensation are intact Psych: euthymic mood, full affect Vascular: She has trace bilateral leg edema.  She has palpable dorsalis pedis and posterior tibial pulse in the left foot.  EKG:  EKG is ordered today. The ekg ordered today demonstrates sinus bradycardia with sinus arrhythmia.  Heart rate is 47 bpm.  Nonspecific ST changes.  Recent Labs: 05/10/2017: ALT 9; Hemoglobin 10.9; Platelets 271.0 06/24/2017: BUN 20; Creatinine, Ser 0.82; Potassium 4.5; Sodium 141; TSH 3.70    Lipid Panel    Component Value Date/Time   CHOL 149 05/10/2017 0925   CHOL 185 10/10/2014 0822   TRIG 81.0 05/10/2017 0925   HDL 65.50 05/10/2017 0925   HDL 94 10/10/2014 0822   CHOLHDL 2 05/10/2017 0925   VLDL 16.2 05/10/2017 0925   LDLCALC 67 05/10/2017 0925   LDLCALC 73 10/10/2014 0822      Wt Readings from Last 3 Encounters:  03/24/18 267 lb 12 oz (121.5 kg)  03/21/18 261 lb 6.4 oz (118.6 kg)  02/22/18 252 lb (114.3 kg)       No flowsheet data found.    ASSESSMENT AND PLAN:  1.  Paroxysmal atrial fibrillation: Currently in normal sinus rhythm with minimal palpitations.  She is  bradycardic today and thus I elected to decrease Toprol to 50 mg once daily. She is tolerating anticoagulation with Pradaxa.    2.  Atypical chest pain with worsening exertional dyspnea: Multiple risk factors for coronary artery disease including diabetes.  I requested a Lexiscan Myoview.  3. Essential hypertension: Blood  pressure is reasonably controlled on current medications.  4.  Slow healing foot ulcer on the left side: Noninvasive vascular studies have been unremarkable and the patient has palpable pulses by exam.    Disposition:   FU with me in 6 months  Signed,  Kathlyn Sacramento, MD  03/24/2018 10:33 AM    [place]

## 2018-03-28 ENCOUNTER — Ambulatory Visit
Admission: RE | Admit: 2018-03-28 | Discharge: 2018-03-28 | Disposition: A | Discharge status: Home / Self Care | Payer: PPO | Source: Ambulatory Visit | Attending: Cardiovascular Disease | Admitting: Cardiovascular Disease

## 2018-03-28 DIAGNOSIS — R079 Chest pain, unspecified: Secondary | ICD-10-CM | POA: Insufficient documentation

## 2018-03-28 LAB — NM MYOCAR MULTI W/SPECT W/WALL MOTION / EF
CHL CUP MPHR: 152 {beats}/min
CSEPED: 0 min
Estimated workload: 1 METS
Exercise duration (sec): 0 s
LV dias vol: 52 mL (ref 46–106)
LV sys vol: 13 mL
Peak HR: 54 {beats}/min
Percent HR: 35 %
Rest HR: 47 {beats}/min
SDS: 1
SRS: 11
SSS: 6
TID: 1.09

## 2018-03-28 MED ORDER — TECHNETIUM TC 99M TETROFOSMIN IV KIT
31.7800 | PACK | Freq: Once | INTRAVENOUS | Status: AC | PRN
  Administered 2018-03-28: 31.78 via INTRAVENOUS

## 2018-03-28 MED ORDER — TECHNETIUM TC 99M TETROFOSMIN IV KIT
10.8700 | PACK | Freq: Once | INTRAVENOUS | Status: AC | PRN
  Administered 2018-03-28: 10.87 via INTRAVENOUS

## 2018-03-28 MED ORDER — REGADENOSON 0.4 MG/5ML IV SOLN
0.4000 mg | Freq: Once | INTRAVENOUS | Status: AC
  Administered 2018-03-28: 0.4 mg via INTRAVENOUS

## 2018-03-30 ENCOUNTER — Telehealth: Payer: Self-pay | Admitting: *Deleted

## 2018-03-30 ENCOUNTER — Other Ambulatory Visit (INDEPENDENT_AMBULATORY_CARE_PROVIDER_SITE_OTHER): Payer: Self-pay | Admitting: Nurse Practitioner

## 2018-03-30 NOTE — Telephone Encounter (Signed)
Patient made aware of results and verbalized understanding.  The patient stated that she has been taking Toprol 50 mg daily. She will keep track of her heart rate for the next week and call back with those readings.

## 2018-03-30 NOTE — Telephone Encounter (Signed)
-----   Message from Wellington Hampshire, MD sent at 03/28/2018  5:26 PM EST ----- Inform patient that  stress test was normal.  Heart rate was still low.  Make sure she cut down the dose of Toprol to 50 mg daily.  She should monitor her heart rate over the next week and if heart rate remains below 50, we should decrease Toprol down to 25 mg daily.

## 2018-04-01 ENCOUNTER — Inpatient Hospital Stay: Admission: RE | Admit: 2018-04-01 | Payer: PPO | Source: Ambulatory Visit

## 2018-04-01 ENCOUNTER — Other Ambulatory Visit: Payer: PPO

## 2018-04-03 MED ORDER — CLINDAMYCIN PHOSPHATE 300 MG/50ML IV SOLN: 300.0000 mg | Freq: Once | INTRAVENOUS | Status: DC

## 2018-04-04 ENCOUNTER — Ambulatory Visit
Admission: RE | Admit: 2018-04-04 | Discharge: 2018-04-04 | Disposition: A | Discharge status: Home / Self Care | Payer: PPO | Attending: Vascular Surgery | Admitting: Vascular Surgery

## 2018-04-04 ENCOUNTER — Encounter
Admission: RE | Disposition: A | Discharge status: Home / Self Care | Payer: Self-pay | Source: Home / Self Care | Attending: Vascular Surgery

## 2018-04-04 ENCOUNTER — Other Ambulatory Visit: Payer: Self-pay | Admitting: Urology

## 2018-04-04 ENCOUNTER — Other Ambulatory Visit: Payer: Self-pay

## 2018-04-04 DIAGNOSIS — L97523 Non-pressure chronic ulcer of other part of left foot with necrosis of muscle: Secondary | ICD-10-CM | POA: Insufficient documentation

## 2018-04-04 DIAGNOSIS — I70299 Other atherosclerosis of native arteries of extremities, unspecified extremity: Secondary | ICD-10-CM

## 2018-04-04 DIAGNOSIS — Z79899 Other long term (current) drug therapy: Secondary | ICD-10-CM | POA: Insufficient documentation

## 2018-04-04 DIAGNOSIS — E1140 Type 2 diabetes mellitus with diabetic neuropathy, unspecified: Secondary | ICD-10-CM | POA: Diagnosis not present

## 2018-04-04 DIAGNOSIS — E1151 Type 2 diabetes mellitus with diabetic peripheral angiopathy without gangrene: Secondary | ICD-10-CM | POA: Insufficient documentation

## 2018-04-04 DIAGNOSIS — L97909 Non-pressure chronic ulcer of unspecified part of unspecified lower leg with unspecified severity: Secondary | ICD-10-CM

## 2018-04-04 DIAGNOSIS — E11621 Type 2 diabetes mellitus with foot ulcer: Secondary | ICD-10-CM | POA: Diagnosis not present

## 2018-04-04 DIAGNOSIS — Z7984 Long term (current) use of oral hypoglycemic drugs: Secondary | ICD-10-CM | POA: Diagnosis not present

## 2018-04-04 DIAGNOSIS — I10 Essential (primary) hypertension: Secondary | ICD-10-CM | POA: Diagnosis not present

## 2018-04-04 DIAGNOSIS — L97929 Non-pressure chronic ulcer of unspecified part of left lower leg with unspecified severity: Secondary | ICD-10-CM | POA: Diagnosis not present

## 2018-04-04 HISTORY — PX: LOWER EXTREMITY ANGIOGRAPHY: CATH118251

## 2018-04-04 LAB — CREATININE, SERUM
Creatinine, Ser: 1.05 mg/dL — ABNORMAL HIGH (ref 0.44–1.00)
GFR calc Af Amer: >60 mL/min (ref >60)
GFR calc non Af Amer: 55 mL/min — ABNORMAL LOW (ref >60)

## 2018-04-04 LAB — BUN: BUN: 19 mg/dL (ref 8–23)

## 2018-04-04 LAB — GLUCOSE, CAPILLARY: Glucose-Capillary: 138 mg/dL — ABNORMAL HIGH (ref 70–99)

## 2018-04-04 SURGERY — LOWER EXTREMITY ANGIOGRAPHY
Anesthesia: Moderate Sedation | Laterality: Left

## 2018-04-04 MED ORDER — HYDRALAZINE HCL 20 MG/ML IJ SOLN: 5.0000 mg | INTRAMUSCULAR | Status: DC | PRN

## 2018-04-04 MED ORDER — HYDROMORPHONE HCL 1 MG/ML IJ SOLN: 1.0000 mg | Freq: Once | INTRAMUSCULAR | Status: DC | PRN

## 2018-04-04 MED ORDER — LIDOCAINE-EPINEPHRINE (PF) 1 %-1:200000 IJ SOLN
INTRAMUSCULAR | Status: AC
  Filled 2018-04-04: qty 10

## 2018-04-04 MED ORDER — HEPARIN (PORCINE) IN NACL 1000-0.9 UT/500ML-% IV SOLN
INTRAVENOUS | Status: AC
  Filled 2018-04-04: qty 1000

## 2018-04-04 MED ORDER — ONDANSETRON HCL 4 MG/2ML IJ SOLN: 4.0000 mg | Freq: Four times a day (QID) | INTRAMUSCULAR | Status: DC | PRN

## 2018-04-04 MED ORDER — ACETAMINOPHEN 325 MG PO TABS: 650.0000 mg | ORAL_TABLET | ORAL | Status: DC | PRN

## 2018-04-04 MED ORDER — SODIUM CHLORIDE 0.9% FLUSH: 3.0000 mL | INTRAVENOUS | Status: DC | PRN

## 2018-04-04 MED ORDER — FENTANYL CITRATE (PF) 100 MCG/2ML IJ SOLN
INTRAMUSCULAR | Status: AC
  Filled 2018-04-04: qty 2

## 2018-04-04 MED ORDER — SODIUM CHLORIDE 0.9 % IV SOLN: INTRAVENOUS | Status: DC

## 2018-04-04 MED ORDER — FENTANYL CITRATE (PF) 100 MCG/2ML IJ SOLN
INTRAMUSCULAR | Status: DC | PRN
  Administered 2018-04-04 (×2): 50 ug via INTRAVENOUS

## 2018-04-04 MED ORDER — HEPARIN SODIUM (PORCINE) 1000 UNIT/ML IJ SOLN
INTRAMUSCULAR | Status: AC
  Filled 2018-04-04: qty 1

## 2018-04-04 MED ORDER — LABETALOL HCL 5 MG/ML IV SOLN: 10.0000 mg | INTRAVENOUS | Status: DC | PRN

## 2018-04-04 MED ORDER — CLINDAMYCIN PHOSPHATE 300 MG/50ML IV SOLN
INTRAVENOUS | Status: AC
  Filled 2018-04-04: qty 50

## 2018-04-04 MED ORDER — SODIUM CHLORIDE 0.9% FLUSH: 3.0000 mL | Freq: Two times a day (BID) | INTRAVENOUS | Status: DC

## 2018-04-04 MED ORDER — SODIUM CHLORIDE 0.9 % IV SOLN
INTRAVENOUS | Status: DC
  Administered 2018-04-04: 1000 mL via INTRAVENOUS

## 2018-04-04 MED ORDER — MIDAZOLAM HCL 2 MG/2ML IJ SOLN
INTRAMUSCULAR | Status: DC | PRN
  Administered 2018-04-04 (×2): 2 mg via INTRAVENOUS

## 2018-04-04 MED ORDER — SODIUM CHLORIDE 0.9 % IV SOLN: 250.0000 mL | INTRAVENOUS | Status: DC | PRN

## 2018-04-04 MED ORDER — MIDAZOLAM HCL 5 MG/5ML IJ SOLN
INTRAMUSCULAR | Status: AC
  Filled 2018-04-04: qty 5

## 2018-04-04 SURGICAL SUPPLY — 8 items
CATH PIG 70CM (CATHETERS) ×2 IMPLANT
DEVICE STARCLOSE SE CLOSURE (Vascular Products) ×2 IMPLANT
DEVICE TORQUE .025-.038 (MISCELLANEOUS) ×2 IMPLANT
PACK ANGIOGRAPHY (CUSTOM PROCEDURE TRAY) ×2 IMPLANT
SHEATH BRITE TIP 5FRX11 (SHEATH) ×2 IMPLANT
TUBING CONTRAST HIGH PRESS 72 (TUBING) ×1 IMPLANT
WIRE AQUATRACK .035X260CM (WIRE) ×1 IMPLANT
WIRE J 3MM .035X145CM (WIRE) ×2 IMPLANT

## 2018-04-04 NOTE — H&P (Signed)
[facility] History & Physical Update  The patient was interviewed and re-examined.  The patient's previous History and Physical has been reviewed and is unchanged.  There is no change in the plan of care. We plan to proceed with the scheduled procedure.  Leotis Pain, MD  04/04/2018, 9:20 AM

## 2018-04-04 NOTE — Op Note (Signed)
[facility]  Percutaneous Study/Intervention Procedural Note   Date of Surgery: 04/04/2018  Surgeon(s):Torell Minder    Assistants:none  Pre-operative Diagnosis: Ulceration left lower extremity  Post-operative diagnosis:  Same  Procedure(s) Performed:             1.  Ultrasound guidance for vascular access right femoral artery             2.  Catheter placement into left SFA from right femoral approach             3.  Aortogram and selective left lower extremity angiogram             4.  StarClose closure device right femoral artery  EBL: 3 cc  Contrast: 35 cc  Fluoro Time: 3.5 minutes  Moderate Conscious Sedation Time: approximately 25 minutes using 4 mg of Versed and 100 Mcg of Fentanyl              Indications:  Patient is a 68 y.o.female with nonhealing ulceration on the left leg. The patient has noninvasive study showing normal ABIs but a reduced digital pressure on the left. The patient is brought in for angiography for further evaluation and potential treatment.  Due to the limb threatening nature of the situation, angiogram was performed for attempted limb salvage. The patient is aware that if the procedure fails, amputation would be expected.  The patient also understands that even with successful revascularization, amputation may still be required due to the severity of the situation. Risks and benefits are discussed and informed consent is obtained.   Procedure:  The patient was identified and appropriate procedural time out was performed.  The patient was then placed supine on the table and prepped and draped in the usual sterile fashion. Moderate conscious sedation was administered during a face to face encounter with the patient throughout the procedure with my supervision of the RN administering medicines and monitoring the patient's vital signs, pulse oximetry, telemetry and mental status throughout from the start of the procedure until the patient  was taken to the recovery room. Ultrasound was used to evaluate the right common femoral artery.  It was patent .  A digital ultrasound image was acquired.  A Seldinger needle was used to access the right common femoral artery under direct ultrasound guidance and a permanent image was performed.  A 0.035 J wire was advanced without resistance and a 5Fr sheath was placed.  Pigtail catheter was placed into the aorta and an AP aortogram was performed. This demonstrated normal renal arteries and normal aorta and iliac segments without significant stenosis. I then crossed the aortic bifurcation and advanced to the left femoral head.  Eventually, the tail catheter was advanced into the mid left SFA to help opacify distally to evaluate the tibial vessels.  We also had to perform frog-leg procedure to evaluate the left popliteal artery behind the knee prosthesis.  Selective left lower extremity angiogram was then performed. This demonstrated normal common femoral artery, profunda femoris artery, superficial femoral artery and popliteal arteries.  There was a normal tibial trifurcation.  The posterior tibial artery was the dominant runoff to the foot, but there were no focal stenoses in the peroneal artery or anterior tibial artery and these were continuous distally to the foot as well.  At this point, there were no significant arterial lesions requiring treatment. I elected to terminate the procedure. The sheath was removed and StarClose closure device was deployed in the right femoral artery  with excellent hemostatic result. The patient was taken to the recovery room in stable condition having tolerated the procedure well.  Findings:               Aortogram:  Normal renal arteries, normal aorta and iliac arteries without significant stenosis             Left Lower Extremity:  Normal common femoral artery, profunda femoris artery, superficial femoral artery and popliteal arteries.  There was a normal tibial  trifurcation.  The posterior tibial artery was the dominant runoff to the foot, but there were no focal stenoses in the peroneal artery or anterior tibial artery and these were continuous distally to the foot as well.   Disposition: Patient was taken to the recovery room in stable condition having tolerated the procedure well.  Complications: None  Leotis Pain 04/04/2018 9:57 AM   This note was created with Dragon Medical transcription system. Any errors in dictation are purely unintentional.

## 2018-04-06 ENCOUNTER — Other Ambulatory Visit: Payer: Self-pay | Admitting: Internal Medicine

## 2018-04-06 DIAGNOSIS — I10 Essential (primary) hypertension: Secondary | ICD-10-CM

## 2018-04-06 MED ORDER — HYDROCHLOROTHIAZIDE 12.5 MG PO TABS: 12.5000 mg | ORAL_TABLET | Freq: Every day | ORAL | 3 refills | Status: DC

## 2018-04-08 ENCOUNTER — Other Ambulatory Visit: Payer: Self-pay | Admitting: Internal Medicine

## 2018-04-08 DIAGNOSIS — F419 Anxiety disorder, unspecified: Secondary | ICD-10-CM

## 2018-04-08 DIAGNOSIS — F32A Depression, unspecified: Secondary | ICD-10-CM

## 2018-04-08 DIAGNOSIS — F329 Major depressive disorder, single episode, unspecified: Secondary | ICD-10-CM

## 2018-04-08 MED ORDER — ALPRAZOLAM 0.5 MG PO TABS: 0.5000 mg | ORAL_TABLET | Freq: Two times a day (BID) | ORAL | 2 refills | Status: DC | PRN

## 2018-04-11 DIAGNOSIS — E1143 Type 2 diabetes mellitus with diabetic autonomic (poly)neuropathy: Secondary | ICD-10-CM | POA: Diagnosis not present

## 2018-04-11 DIAGNOSIS — L97521 Non-pressure chronic ulcer of other part of left foot limited to breakdown of skin: Secondary | ICD-10-CM | POA: Diagnosis not present

## 2018-04-16 ENCOUNTER — Other Ambulatory Visit: Payer: Self-pay | Admitting: Cardiovascular Disease

## 2018-04-16 DIAGNOSIS — I4891 Unspecified atrial fibrillation: Secondary | ICD-10-CM

## 2018-04-19 ENCOUNTER — Ambulatory Visit (INDEPENDENT_AMBULATORY_CARE_PROVIDER_SITE_OTHER): Payer: PPO | Admitting: Family Medicine

## 2018-04-19 ENCOUNTER — Encounter: Payer: Self-pay | Admitting: Family Medicine

## 2018-04-19 ENCOUNTER — Ambulatory Visit (INDEPENDENT_AMBULATORY_CARE_PROVIDER_SITE_OTHER): Payer: PPO

## 2018-04-19 VITALS — BP 128/76 | HR 81 | Temp 98.2°F | Resp 20 | Ht 65.5 in | Wt 259.4 lb

## 2018-04-19 DIAGNOSIS — W19XXXA Unspecified fall, initial encounter: Secondary | ICD-10-CM | POA: Diagnosis not present

## 2018-04-19 DIAGNOSIS — S3982XA Other specified injuries of lower back, initial encounter: Secondary | ICD-10-CM | POA: Diagnosis not present

## 2018-04-19 DIAGNOSIS — S3992XA Unspecified injury of lower back, initial encounter: Secondary | ICD-10-CM

## 2018-04-19 DIAGNOSIS — G4733 Obstructive sleep apnea (adult) (pediatric): Secondary | ICD-10-CM | POA: Diagnosis not present

## 2018-04-19 NOTE — Progress Notes (Signed)
Subjective:    Patient ID: [name], female    DOB: 13-Oct-1950, 68 y.o.   MRN: [id number]  HPI   Patient presents to clinic complaining of pain in her tailbone.  Patient states that she fell backwards landing directly on her buttocks 3 days ago.  States she was attempting to fix cushion on stool, states when she tried to pull collection down lost her footing and fell backward.  Denies hitting head or losing consciousness.  Patient takes hydrocodone regularly for chronic pain issues.  Patient states hydrocodone does not seem to be helping pain much.  Denies saddle anesthesia.  Denies loss of bowel or bladder control more so with been stress incontinence that is her normal (wears depends).    Patient Active Problem List   Diagnosis Date Noted  . Foot ulcer, left ([place]) 03/09/2018  . Adrenal mass ([place]) 08/02/2017  . Chronic atrial fibrillation 08/02/2017  . Eczema 06/27/2017  . Diabetic ulcer of left foot ([place]) 06/24/2017  . Mass of both adrenal glands ([place]) 05/16/2017  . Fatty liver 05/13/2017  . Strain of right hip 04/30/2017  . Ischial pain, right 04/30/2017  . Pain of left hip joint 04/22/2017  . Abnormality of gait and mobility 04/22/2017  . Muscle strain of left hip 04/09/2017  . Fall with injury 02/02/2017  . Anemia 11/18/2016  . Asthma 11/18/2016  . Carpal tunnel syndrome 11/18/2016  . Pain in joint involving ankle and foot 11/18/2016  . Obstructive sleep apnea of adult 11/18/2016  . Spinal stenosis of thoracic region 11/18/2016  . Abscess of tendon of left foot 09/21/2016  . Cubital tunnel syndrome on right 08/07/2016  . Chronic pain syndrome 02/25/2016  . Long term current use of opiate analgesic 02/25/2016  . Chronic right shoulder pain 02/25/2016  . Status post reverse total shoulder replacement 10/08/2015  . 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 Morbid obesity with BMI of 40.0-44.9, adult ([place]) 09/23/2015  . Chronic prescription  benzodiazepine use 09/23/2015  . Osteoarthritis, multiple sites 09/23/2015  . Other shoulder lesions (Right) 08/28/2015  . Cervical radiculitis 08/06/2015  . Opiate use (15 MME/Day) 01/16/2015  . Impingement syndrome of shoulder region 01/07/2015  . Mixed incontinence 11/26/2014  . Atrophic vaginitis 11/26/2014  . Anxiety and depression 09/21/2014  . Bladder cystocele 09/21/2014  . Gastro-esophageal reflux disease without esophagitis 09/21/2014  . DM (diabetes mellitus) type II, controlled, with peripheral vascular disorder ([place]) 08/31/2014  . Diabetic peripheral neuropathy associated with type 2 diabetes mellitus ([place]) 08/31/2014  . Asthma, moderate persistent, well-controlled 08/31/2014  . Hypothyroidism, adult 08/31/2014  . Peripheral vascular disease due to secondary diabetes mellitus ([place]) 12/11/2013  . Apnea, sleep 10/19/2013  . Paroxysmal atrial fibrillation ([place]); CHA2DS2-Vasc Score 5. On Pradaxa 07/27/2013  . Essential hypertension 07/27/2013   Social History   Tobacco Use  . Smoking status: Never Smoker  . Smokeless tobacco: Never Used  Substance Use Topics  . Alcohol use: No    Alcohol/week: 0.0 standard drinks   Review of Systems   Constitutional: Negative for chills, fatigue and fever.  HENT: Negative for congestion, ear pain, sinus pain and sore throat.   Eyes: Negative.   Respiratory: Negative for cough, shortness of breath and wheezing.   Cardiovascular: Negative for chest pain, palpitations and leg swelling.  Gastrointestinal: Negative for abdominal pain, diarrhea, nausea and vomiting.  Genitourinary: Negative for dysuria, frequency and urgency.  Musculoskeletal: +tail bone pain Skin: Negative for color change, pallor and rash.  Neurological: Negative for syncope, light-headedness and headaches.  Psychiatric/Behavioral: The patient is not nervous/anxious.       Objective:   Physical Exam Constitutional:      General: She is not in acute distress.     Appearance: She is obese.     Comments: Walks with 4 wheeled walker.   HENT:     Head: Normocephalic and atraumatic.  Eyes:     General: No scleral icterus.    Extraocular Movements: Extraocular movements intact.     Pupils: Pupils are equal, round, and reactive to light.  Neck:     Musculoskeletal: Neck supple. No neck rigidity.  Cardiovascular:     Rate and Rhythm: Normal rate and regular rhythm.  Pulmonary:     Effort: Pulmonary effort is normal. No respiratory distress.     Breath sounds: Normal breath sounds.  Musculoskeletal:       Back:     Comments: Location of pain indicated by red circle on diagram. Area is tender when touched. No tenderness of lumbar, thoracic or cervical spine with palpation.  Walks well with her walker.   Lymphadenopathy:     Cervical: No cervical adenopathy.  Skin:    General: Skin is warm and dry.  Neurological:     Mental Status: She is alert and oriented to person, place, and time.  Psychiatric:        Mood and Affect: Mood normal.        Behavior: Behavior normal.     Vitals:   04/19/18 1416  BP: 128/76  Pulse: 81  Resp: 20  Temp: 98.2 F (36.8 C)  SpO2: 93%      Assessment & Plan:   Fall/tailbone injury --we will get x-ray to rule out fracture.  Patient requested stronger pain medication; however I advised patient that due to the amount of hydrocodone tablets she gets via pain management I would feel uncomfortable prescribing additional narcotic.  Advised she can do a topical rubs such as BenGay or Biofreeze, and can also try sitting on a heating pad to help reduce pain.  Patient will be made aware of x-ray results when they are available.  She will keep regularly scheduled visit with PCP as planned.

## 2018-04-20 ENCOUNTER — Other Ambulatory Visit: Payer: Self-pay | Admitting: Internal Medicine

## 2018-04-20 DIAGNOSIS — I10 Essential (primary) hypertension: Secondary | ICD-10-CM

## 2018-04-20 MED ORDER — AMLODIPINE BESYLATE 5 MG PO TABS: 5.0000 mg | ORAL_TABLET | Freq: Every day | ORAL | 3 refills | Status: DC

## 2018-04-22 ENCOUNTER — Telehealth: Payer: Self-pay | Admitting: Internal Medicine

## 2018-04-22 NOTE — Telephone Encounter (Signed)
I am nervous to send in a muscle relaxer on top of all the pain medications and also the benzodiazepine she has on her list. I would recommend a topical rub like biofreeze or bengay, heating pad.  I do not send in antibiotics without evaluating for that issue first. I would recommend she schedule an appt for her cough or go to the walk in

## 2018-04-22 NOTE — Telephone Encounter (Signed)
rec mucinex DM green label/robitussin DM for cough  She can double up on Advair and take 2 puffs 2x per day Continue as needed albuterol Continue spiriva daily   If not better f/u with pulmonary for chronic bronchitis/asthma   Xray was normal. She can try heat, otc lidocaine patch, salonpas, aspercream for pain

## 2018-04-22 NOTE — Telephone Encounter (Signed)
Per DPR released results of Xray to husband hippa compliant, patient sleeping , patient husband ask would a muscle relaxer help her pain she is having.

## 2018-04-22 NOTE — Telephone Encounter (Signed)
Copied from [place] [id number]. Topic: General - Inquiry >> Apr 22, 2018 11:13 AM Margot Ables wrote: Reason for CRM: Pt states since her OV 04/19/2018 with Philis Nettle, NP she has been laying down to help with the pain. She is asking about xray results from 04/19/2018. She notes that she has developed a bad cough and coughing up yellow stuff. She feels she has developed bronchitis and is wanting to know if ABX can be called in (or whatever Dr. Olivia Mackie or Ander Purpura thinks is best).   [place], [place] - [place] [phone] (Phone) [phone] (Fax)

## 2018-04-22 NOTE — Telephone Encounter (Signed)
Patient voiced understanding and will see UC for cold symptoms if needed.

## 2018-04-22 NOTE — Telephone Encounter (Signed)
Pt calling back for results. Pt would like a call back as soon as possible.  Pt would also like some muscle relaxers called in. Please advise

## 2018-04-25 NOTE — Telephone Encounter (Signed)
Patient aware and voiced understanding

## 2018-04-27 DIAGNOSIS — G4733 Obstructive sleep apnea (adult) (pediatric): Secondary | ICD-10-CM | POA: Diagnosis not present

## 2018-04-27 DIAGNOSIS — J4520 Mild intermittent asthma, uncomplicated: Secondary | ICD-10-CM | POA: Diagnosis not present

## 2018-04-27 DIAGNOSIS — E1143 Type 2 diabetes mellitus with diabetic autonomic (poly)neuropathy: Secondary | ICD-10-CM | POA: Diagnosis not present

## 2018-04-27 DIAGNOSIS — R0609 Other forms of dyspnea: Secondary | ICD-10-CM | POA: Diagnosis not present

## 2018-04-27 DIAGNOSIS — L97521 Non-pressure chronic ulcer of other part of left foot limited to breakdown of skin: Secondary | ICD-10-CM | POA: Diagnosis not present

## 2018-05-08 ENCOUNTER — Other Ambulatory Visit: Payer: Self-pay | Admitting: Internal Medicine

## 2018-05-08 DIAGNOSIS — M1990 Unspecified osteoarthritis, unspecified site: Secondary | ICD-10-CM

## 2018-05-08 MED ORDER — MELOXICAM 15 MG PO TABS: 15.0000 mg | ORAL_TABLET | Freq: Every day | ORAL | 1 refills | Status: DC | PRN

## 2018-05-09 ENCOUNTER — Ambulatory Visit: Payer: Self-pay | Admitting: Urology

## 2018-05-09 ENCOUNTER — Other Ambulatory Visit: Payer: PPO

## 2018-05-12 ENCOUNTER — Encounter: Payer: Self-pay | Admitting: Internal Medicine

## 2018-05-12 ENCOUNTER — Ambulatory Visit (INDEPENDENT_AMBULATORY_CARE_PROVIDER_SITE_OTHER): Payer: PPO | Admitting: Internal Medicine

## 2018-05-12 VITALS — BP 136/70 | HR 62 | Temp 97.8°F | Ht 65.0 in | Wt 265.8 lb

## 2018-05-12 DIAGNOSIS — I10 Essential (primary) hypertension: Secondary | ICD-10-CM

## 2018-05-12 DIAGNOSIS — R269 Unspecified abnormalities of gait and mobility: Secondary | ICD-10-CM

## 2018-05-12 DIAGNOSIS — E1142 Type 2 diabetes mellitus with diabetic polyneuropathy: Secondary | ICD-10-CM | POA: Diagnosis not present

## 2018-05-12 DIAGNOSIS — J40 Bronchitis, not specified as acute or chronic: Secondary | ICD-10-CM | POA: Diagnosis not present

## 2018-05-12 DIAGNOSIS — E1151 Type 2 diabetes mellitus with diabetic peripheral angiopathy without gangrene: Secondary | ICD-10-CM | POA: Diagnosis not present

## 2018-05-12 DIAGNOSIS — Z1231 Encounter for screening mammogram for malignant neoplasm of breast: Secondary | ICD-10-CM

## 2018-05-12 DIAGNOSIS — D649 Anemia, unspecified: Secondary | ICD-10-CM | POA: Diagnosis not present

## 2018-05-12 DIAGNOSIS — J4531 Mild persistent asthma with (acute) exacerbation: Secondary | ICD-10-CM

## 2018-05-12 DIAGNOSIS — Z1329 Encounter for screening for other suspected endocrine disorder: Secondary | ICD-10-CM | POA: Diagnosis not present

## 2018-05-12 LAB — CBC WITH DIFFERENTIAL/PLATELET
Basophils Absolute: 0.1 10*3/uL (ref 0.0–0.1)
Basophils Relative: 1.5 % (ref 0.0–3.0)
Eosinophils Absolute: 0.5 10*3/uL (ref 0.0–0.7)
Eosinophils Relative: 7.3 % — ABNORMAL HIGH (ref 0.0–5.0)
HCT: 37.9 % (ref 36.0–46.0)
Hemoglobin: 12.4 g/dL (ref 12.0–15.0)
Lymphocytes Relative: 19.8 % (ref 12.0–46.0)
Lymphs Abs: 1.3 10*3/uL (ref 0.7–4.0)
MCHC: 32.6 g/dL (ref 30.0–36.0)
MCV: 89.3 fl (ref 78.0–100.0)
MONO ABS: 0.6 10*3/uL (ref 0.1–1.0)
Monocytes Relative: 9.6 % (ref 3.0–12.0)
Neutro Abs: 3.9 10*3/uL (ref 1.4–7.7)
Neutrophils Relative %: 61.8 % (ref 43.0–77.0)
Platelets: 276 10*3/uL (ref 150.0–400.0)
RBC: 4.25 Mil/uL (ref 3.87–5.11)
RDW: 14.1 % (ref 11.5–15.5)
WBC: 6.3 10*3/uL (ref 4.0–10.5)

## 2018-05-12 LAB — COMPREHENSIVE METABOLIC PANEL
ALT: 11 U/L (ref 0–35)
AST: 14 U/L (ref 0–37)
Albumin: 4 g/dL (ref 3.5–5.2)
Alkaline Phosphatase: 84 U/L (ref 39–117)
BILIRUBIN TOTAL: 0.3 mg/dL (ref 0.2–1.2)
BUN: 24 mg/dL — ABNORMAL HIGH (ref 6–23)
CO2: 28 mEq/L (ref 19–32)
Calcium: 9.3 mg/dL (ref 8.4–10.5)
Chloride: 104 mEq/L (ref 96–112)
Creatinine, Ser: 1.1 mg/dL (ref 0.40–1.20)
GFR: 49.29 mL/min — ABNORMAL LOW (ref >60.00)
Glucose, Bld: 110 mg/dL — ABNORMAL HIGH (ref 70–99)
POTASSIUM: 5 meq/L (ref 3.5–5.1)
SODIUM: 140 meq/L (ref 135–145)
Total Protein: 6.7 g/dL (ref 6.0–8.3)

## 2018-05-12 LAB — LIPID PANEL
Cholesterol: 181 mg/dL (ref 0–200)
HDL: 82.6 mg/dL (ref >39.00)
LDL Cholesterol: 80 mg/dL (ref 0–99)
NonHDL: 98.5
Total CHOL/HDL Ratio: 2
Triglycerides: 95 mg/dL (ref 0.0–149.0)
VLDL: 19 mg/dL (ref 0.0–40.0)

## 2018-05-12 LAB — TSH: TSH: 1.57 u[IU]/mL (ref 0.35–4.50)

## 2018-05-12 LAB — HEMOGLOBIN A1C: Hgb A1c MFr Bld: 6.5 % (ref 4.6–6.5)

## 2018-05-12 LAB — IBC + FERRITIN
Ferritin: 14.8 ng/mL (ref 10.0–291.0)
Iron: 70 ug/dL (ref 42–145)
Saturation Ratios: 14.9 % — ABNORMAL LOW (ref 20.0–50.0)
Transferrin: 336 mg/dL (ref 212.0–360.0)

## 2018-05-12 MED ORDER — PREDNISONE 20 MG PO TABS: 20.0000 mg | ORAL_TABLET | Freq: Every day | ORAL | 0 refills | Status: DC

## 2018-05-12 MED ORDER — LOSARTAN POTASSIUM 50 MG PO TABS: 50.0000 mg | ORAL_TABLET | Freq: Every day | ORAL | 3 refills | Status: DC

## 2018-05-12 MED ORDER — HYDROCOD POLST-CPM POLST ER 10-8 MG/5ML PO SUER: 5.0000 mL | Freq: Every evening | ORAL | 0 refills | Status: DC | PRN

## 2018-05-12 MED ORDER — DOXYCYCLINE HYCLATE 100 MG PO TABS: 100.0000 mg | ORAL_TABLET | Freq: Two times a day (BID) | ORAL | 0 refills | Status: DC

## 2018-05-12 NOTE — Progress Notes (Signed)
Chief Complaint  Patient presents with  . Cough  . URI   F/u  1. Off balance due to left foot chronic wound  2. Asthmatic bronchitis uncontrolled given zpack by Dr. Raul Del 04/27/18 notbetter on prn Albuterol, Advair, singulair, xyzal, spriva still with flares  3. HTN controlled but due to cough change quinapril 40 mg to ARB to see if will help with #2    Review of Systems  Constitutional: Negative for weight loss.  HENT: Negative for hearing loss.   Eyes: Negative for blurred vision.  Respiratory: Positive for cough, sputum production, shortness of breath and wheezing.        Thick sputum    Cardiovascular: Negative for chest pain.  Gastrointestinal: Negative for abdominal pain.  Skin: Negative for rash.  Neurological: Negative for headaches.       Off balance    Psychiatric/Behavioral: Negative for depression.   Past Medical History:  Diagnosis Date  . Abnormal antibody titer   . Anxiety and depression   . Arthritis   . Asthma   . Cystocele   . Depression   . Diabetes ([place])   . DVT (deep venous thrombosis) (HCC)    Righ calf  . Dysrhythmia   . Edema   . GERD (gastroesophageal reflux disease)   . Heart murmur    Echocardiogram June 2015: Mild MR (possible vegetation seen on TTE, not seen on TEE), normal LV size with moderate concentric LVH. Normal function EF 60-65%. Normal diastolic function. Mild LA dilation.  Marland Kitchen History of IBS   . History of methicillin resistant staphylococcus aureus (MRSA) 2008  . Hyperlipidemia   . Hypertension   . Hypothyroidism   . Insomnia   . Kidney stone    kidney stones with lithotripsy  . Neuropathy involving both lower extremities   . Obesity, Class II, BMI 35-39.9, with comorbidity   . Paroxysmal atrial fibrillation ([place]) 2007   In June 2015, Cardiac Event Monitor: Mostly SR/sinus arrhythmia with PVCs that are frequent. Short bursts of A. fib lasting several minutes;; CHA2DS2-VASc Score = 5 (age, Female, PVD, DM, HTN)  . Peripheral  vascular disease ([place])   . Sleep apnea    use C-PAP  . Urinary incontinence   . Venous stasis dermatitis of both lower extremities    Past Surgical History:  Procedure Laterality Date  . ANKLE SURGERY Right   . APPLICATION OF WOUND VAC Left 06/27/2015   Procedure: APPLICATION OF WOUND VAC ( POSSIBLE ) ;  Surgeon: [name], MD;  Location: [facility] ORS;  Service: Vascular;  Laterality: Left;  . APPLICATION OF WOUND VAC Left 09/25/2016   Procedure: APPLICATION OF WOUND VAC;  Surgeon: [name], DPM;  Location: [facility];  Service: Podiatry;  Laterality: Left;  . arm surgery     right    . CHOLECYSTECTOMY    . COLONOSCOPY    . COLONOSCOPY WITH PROPOFOL N/A 01/11/2017   Procedure: COLONOSCOPY WITH PROPOFOL;  Surgeon: [name], MD;  Location: [facility] ENDOSCOPY;  Service: Endoscopy;  Laterality: N/A;  . COLONOSCOPY WITH PROPOFOL N/A 02/22/2017   Procedure: COLONOSCOPY WITH PROPOFOL;  Surgeon: [name], MD;  Location: [facility] ENDOSCOPY;  Service: Endoscopy;  Laterality: N/A;  . COLONOSCOPY WITH PROPOFOL N/A 05/31/2017   Procedure: COLONOSCOPY WITH PROPOFOL;  Surgeon: [name], MD;  Location: [facility] ENDOSCOPY;  Service: Endoscopy;  Laterality: N/A;  . ESOPHAGOGASTRODUODENOSCOPY (EGD) WITH PROPOFOL N/A 01/11/2017   Procedure: ESOPHAGOGASTRODUODENOSCOPY (EGD) WITH PROPOFOL;  Surgeon: [name],  MD;  Location: [facility] ENDOSCOPY;  Service: Endoscopy;  Laterality: N/A;  . HERNIA REPAIR     umbilical  . HIATAL HERNIA REPAIR    . I&D EXTREMITY Left 06/27/2015   Procedure: IRRIGATION AND DEBRIDEMENT EXTREMITY            ( CALF HEMATOMA ) POSSIBLE WOUND VAC;  Surgeon: [name], MD;  Location: [facility] ORS;  Service: Vascular;  Laterality: Left;  . INCISION AND DRAINAGE OF WOUND Left 09/25/2016   Procedure: IRRIGATION AND DEBRIDEMENT - PARTIAL RESECTION OF ACHILLES TENDON WITH WOUND VAC APPLICATION;  Surgeon: [name], DPM;  Location: [facility] OR[facility];  Service: Podiatry;   Laterality: Left;  . IRRIGATION AND DEBRIDEMENT ABSCESS Left 09/07/2016   Procedure: IRRIGATION AND DEBRIDEMENT ABSCESS LEFT HEEL;  Surgeon: [name], DPM;  Location: [facility];  Service: Podiatry;  Laterality: Left;  . JOINT REPLACEMENT  2013   left knee replacement  . KIDNEY SURGERY Right    kidney stones  . LITHOTRIPSY    . LOWER EXTREMITY ANGIOGRAPHY Left 04/04/2018   Procedure: LOWER EXTREMITY ANGIOGRAPHY;  Surgeon: [name], MD;  Location: [place] [facility];  Service: Cardiovascular;  Laterality: Left;  . NM GATED MYOVIEW ([place] HX)  February 2017   Likely breast attenuation. LOW RISK study. Normal EF 55-60%.  . OTHER SURGICAL HISTORY     08/2016 or 09/2016 surgery on achilles tenso h/o staph infection heal. Dr. [name]  . removal of left hematoma Left    leg  . REVERSE SHOULDER ARTHROPLASTY Right 10/08/2015   Procedure: REVERSE SHOULDER ARTHROPLASTY;  Surgeon: [name], MD;  Location: [facility];  Service: Orthopedics;  Laterality: Right;  . SLEEVE GASTROPLASTY    . TONSILLECTOMY    . TOTAL KNEE ARTHROPLASTY    . TOTAL SHOULDER REPLACEMENT Right   . TRANSESOPHAGEAL ECHOCARDIOGRAM  08/08/2013   Mild LVH, EF 60-65%. Moderate LA dilation and mild RA dilation. Mild MR with no evidence of stenosis and no evidence of endocarditis. A false chordae is noted.  . TRANSTHORACIC ECHOCARDIOGRAM  08/03/2013   Mild-Moderate concentric LVH, EF 60-65%. Normal diastolic function. Mild LA dilation. Mild MR with possible vegetation  - not confirmed on TEE   . ULNAR NERVE TRANSPOSITION Right 08/06/2016   Procedure: ULNAR NERVE DECOMPRESSION/TRANSPOSITION;  Surgeon: [name], MD;  Location: [facility] ORS;  Service: Orthopedics;  Laterality: Right;  . UPPER GI ENDOSCOPY    . VAGINAL HYSTERECTOMY     Family History  Problem Relation Age of Onset  . Skin cancer Father   . Diabetes Father   . Hypertension Father   . Peripheral vascular disease Father   . Cancer Father   . Cerebral  aneurysm Father   . Alcohol abuse Father   . Varicose Veins Mother   . Kidney disease Mother   . Arthritis Mother   . Mental illness Sister   . Cancer Maternal Aunt        breast  . Breast cancer Maternal Aunt   . Arthritis Maternal Grandmother   . Hypertension Maternal Grandmother   . Diabetes Maternal Grandmother   . Arthritis Maternal Grandfather   . Heart disease Maternal Grandfather   . Hypertension Maternal Grandfather   . Arthritis Paternal Grandmother   . Hypertension Paternal Grandmother   . Diabetes Paternal Grandmother   . Arthritis Paternal Grandfather   . Hypertension Paternal Grandfather   . Bladder Cancer Neg Hx   . Kidney cancer Neg Hx    Social History  Socioeconomic History  . Marital status: Married    Spouse name: Not on file  . Number of children: Not on file  . Years of education: Not on file  . Highest education level: Not on file  Occupational History  . Not on file  Social Needs  . Financial resource strain: Not on file  . Food insecurity:    Worry: Not on file    Inability: Not on file  . Transportation needs:    Medical: Not on file    Non-medical: Not on file  Tobacco Use  . Smoking status: Never Smoker  . Smokeless tobacco: Never Used  Substance and Sexual Activity  . Alcohol use: No    Alcohol/week: 0.0 standard drinks  . Drug use: No  . Sexual activity: Yes  Lifestyle  . Physical activity:    Days per week: Not on file    Minutes per session: Not on file  . Stress: Not on file  Relationships  . Social connections:    Talks on phone: Not on file    Gets together: Not on file    Attends religious service: Not on file    Active member of club or organization: Not on file    Attends meetings of clubs or organizations: Not on file    Relationship status: Not on file  . Intimate partner violence:    Fear of current or ex partner: Not on file    Emotionally abused: Not on file    Physically abused: Not on file    Forced sexual  activity: Not on file  Other Topics Concern  . Not on file  Social History Narrative   She is currently married -- for 30+ years. Does not work. Does not smoke or take alcohol. She never smoked. She exercises at least 3 days a week since before her gastric surgery.   Marital status reviewed in history of present illness.   She has children and grandchildren    Current Meds  Medication Sig  . albuterol (PROVENTIL HFA;VENTOLIN HFA) 108 (90 Base) MCG/ACT inhaler Inhale 1-2 puffs into the lungs every 4 (four) hours as needed for wheezing or shortness of breath. (Patient taking differently: Inhale 2 puffs into the lungs every 4 (four) hours as needed for wheezing or shortness of breath. )  . albuterol (PROVENTIL) (2.5 MG/3ML) 0.083% nebulizer solution Take 3 mLs (2.5 mg total) by nebulization every 6 (six) hours as needed for wheezing or shortness of breath.  . ALPRAZolam (XANAX) 0.5 MG tablet Take 1 tablet (0.5 mg total) by mouth 2 (two) times daily as needed for anxiety.  Marland Kitchen amLODipine (NORVASC) 5 MG tablet Take 1 tablet (5 mg total) by mouth daily.  Marland Kitchen buPROPion (WELLBUTRIN XL) 300 MG 24 hr tablet Take 1 tablet (300 mg total) by mouth daily.  . Calcium Citrate-Vitamin D (CALCIUM + D PO) Take 1 tablet by mouth 2 (two) times daily.  . clobetasol cream (TEMOVATE) 6.29 % Apply 1 application topically 2 (two) times daily. To arms (Patient taking differently: Apply 1 application topically 2 (two) times daily as needed (irritation). To arms)  . dexlansoprazole (DEXILANT) 60 MG capsule Take 1 capsule (60 mg total) by mouth daily. Minutes before meal (lunch)  . diclofenac sodium (VOLTAREN) 1 % GEL Apply 1 application topically daily as needed (pain).   . DULoxetine (CYMBALTA) 60 MG capsule Take 1 capsule (60 mg total) by mouth daily.  Marland Kitchen estradiol (ESTRACE) 0.1 MG/GM vaginal cream Apply 0.5mg  (pea-sized amount)  just inside the vaginal introitus with a finger-tip on Monday, Wednesday and Friday nights,  .  Fluticasone-Salmeterol (ADVAIR) 250-50 MCG/DOSE AEPB Inhale 1 puff into the lungs 2 (two) times daily.  Marland Kitchen gabapentin (NEURONTIN) 800 MG tablet Take 1 tablet (800 mg total) by mouth 4 (four) times daily.  . hydrochlorothiazide (HYDRODIURIL) 12.5 MG tablet Take 1 tablet (12.5 mg total) by mouth daily. In am  . HYDROcodone-acetaminophen (NORCO/VICODIN) 5-325 MG tablet Take 1 tablet by mouth every 8 (eight) hours as needed for severe pain.  Marland Kitchen levocetirizine (XYZAL) 5 MG tablet TAKE 1 TABLET BY MOUTH EVERY EVENING  . levothyroxine (SYNTHROID, LEVOTHROID) 175 MCG tablet Take 1 tablet (175 mcg total) by mouth daily before breakfast.  . meloxicam (MOBIC) 15 MG tablet Take 1 tablet (15 mg total) by mouth daily as needed.  . metFORMIN (GLUCOPHAGE) 1000 MG tablet TAKE ONE TABLET BY MOUTH TWICE DAILY (Patient taking differently: Take 500-1,000 mg by mouth See admin instructions. 1000 mg in the morning, and 500 mg at bedtime)  . metoprolol succinate (TOPROL-XL) 50 MG 24 hr tablet Take 1 tablet (50 mg total) by mouth daily. Take with or immediately following a meal.  . montelukast (SINGULAIR) 10 MG tablet Take 1 tablet (10 mg total) by mouth daily.  . mupirocin ointment (BACTROBAN) 2 % Apply 1 application topically 2 (two) times daily. Skin wounds  . MYRBETRIQ 50 MG TB24 tablet TAKE ONE TABLET BY MOUTH EVERY DAY  . nystatin cream (MYCOSTATIN) Apply 1 application topically 2 (two) times daily. Mouth prn for 7 days as needed (Patient taking differently: Apply 1 application topically 2 (two) times daily as needed for dry skin. Mouth prn for 7 days as needed)  . oxybutynin (DITROPAN XL) 15 MG 24 hr tablet TAKE ONE TABLET BY MOUTH AT BEDTIME  . PRADAXA 150 MG CAPS capsule TAKE 1 CAPSULE BY MOUTH TWICE DAILY  . Tiotropium Bromide Monohydrate (SPIRIVA RESPIMAT) 1.25 MCG/ACT AERS Inhale 2 puffs into the lungs daily.  Marland Kitchen zinc oxide 20 % ointment Apply 1 application topically as needed for irritation.  . [DISCONTINUED]  quinapril (ACCUPRIL) 40 MG tablet Take 1 tablet (40 mg total) by mouth daily.   Current Facility-Administered Medications for the 05/12/18 encounter (Office Visit) with [name], [name], MD  Medication  . ipratropium-albuterol (DUONEB) 0.5-2.5 (3) MG/3ML nebulizer solution 3 mL   Allergies  Allergen Reactions  . Penicillins Hives, Shortness Of Breath, Swelling and Other (See Comments)    Facial swelling Has patient had a PCN reaction causing immediate rash, facial/tongue/throat swelling, SOB or lightheadedness with hypotension: Yes Has patient had a PCN reaction causing severe rash involving mucus membranes or skin necrosis: Yes Has patient had a PCN reaction that required hospitalization No Has patient had a PCN reaction occurring within the last 10 years: No If all of the above answers are "NO", then may proceed with Cephalosporin use.   . Aspirin Hives  . Morphine And Related Other (See Comments)    Patient becomes very confused  . Terramycin [Oxytetracycline] Hives   Recent Results (from the past 2160 hour(s))  Urinalysis, Complete     Status: None   Collection Time: 02/21/18  9:04 AM  Result Value Ref Range   Specific Gravity, UA 1.025 1.005 - 1.030   pH, UA 5.0 5.0 - 7.5   Color, UA Yellow Yellow   Appearance Ur Clear Clear   Leukocytes, UA Negative Negative   Protein, UA Negative Negative/Trace   Glucose, UA Negative Negative  Ketones, UA Negative Negative   RBC, UA Negative Negative   Bilirubin, UA Negative Negative   Urobilinogen, Ur 0.2 0.2 - 1.0 mg/dL   Nitrite, UA Negative Negative   Microscopic Examination See below:   Microscopic Examination     Status: Abnormal   Collection Time: 02/21/18  9:04 AM  Result Value Ref Range   WBC, UA None seen 0 - 5 /hpf   RBC, UA None seen 0 - 2 /hpf   Epithelial Cells (non renal) None seen 0 - 10 /hpf   Casts Present (A) None seen /lpf   Cast Type Hyaline casts N/A   Mucus, UA Present (A) Not Estab.   Bacteria, UA  Moderate (A) None seen/Few  CULTURE, URINE COMPREHENSIVE     Status: None   Collection Time: 02/21/18 11:45 AM  Result Value Ref Range   Urine Culture, Comprehensive Final report    Organism ID, Bacteria Lactobacillus species     Comment: 10,000-25,000 colony forming units per mL Susceptibility not normally performed on this organism.   NM Myocar Multi W/Spect W/Wall Motion / EF     Status: None   Collection Time: 03/28/18  1:23 PM  Result Value Ref Range   Rest HR 47 bpm   Rest BP 103/47 mmHg   Exercise duration (sec) 0 sec   Percent HR 35 %   Exercise duration (min) 0 min   Estimated workload 1.0 METS   Peak HR 54 bpm   Peak BP 117/65 mmHg   MPHR 152 bpm   SSS 6    SRS 11    SDS 1    TID 1.09    LV sys vol 13 mL   LV dias vol 52 46 - 106 mL  Glucose, capillary     Status: Abnormal   Collection Time: 04/04/18  8:26 AM  Result Value Ref Range   Glucose-Capillary 138 (H) 70 - 99 mg/dL  BUN     Status: None   Collection Time: 04/04/18  8:57 AM  Result Value Ref Range   BUN 19 8 - 23 mg/dL    Comment: Performed at [facility], [address] [place], [place] [address]  Creatinine, serum     Status: Abnormal   Collection Time: 04/04/18  8:57 AM  Result Value Ref Range   Creatinine, Ser 1.05 (H) 0.44 - 1.00 mg/dL   GFR calc non Af Amer 55 (L) >60 mL/min   GFR calc Af Amer >60 >60 mL/min    Comment: Performed at [facility], [address] [place], [place] [address]   Objective  Body mass index is 44.23 kg/m. Wt Readings from Last 3 Encounters:  05/12/18 265 lb 12.8 oz (120.6 kg)  04/19/18 259 lb 6.4 oz (117.7 kg)  03/24/18 267 lb 12 oz (121.5 kg)   Temp Readings from Last 3 Encounters:  05/12/18 97.8 F (36.6 C) (Oral)  04/19/18 98.2 F (36.8 C) (Oral)  04/04/18 97.9 F (36.6 C) (Oral)   BP Readings from Last 3 Encounters:  05/12/18 136/70  04/19/18 128/76  04/04/18 (!) 143/65   Pulse Readings from Last 3 Encounters:  05/12/18 62   04/19/18 81  04/04/18 (!) 49    Physical Exam Vitals signs and nursing note reviewed.  Constitutional:      Appearance: Normal appearance. She is well-developed. She is morbidly obese.  HENT:     Head: Normocephalic and atraumatic.     Nose: Nose normal.     Mouth/Throat:  Mouth: Mucous membranes are moist.     Pharynx: Oropharynx is clear.  Eyes:     Conjunctiva/sclera: Conjunctivae normal.     Pupils: Pupils are equal, round, and reactive to light.  Cardiovascular:     Rate and Rhythm: Normal rate and regular rhythm.     Heart sounds: Normal heart sounds. No murmur.  Pulmonary:     Effort: Pulmonary effort is normal.     Breath sounds: Wheezing present.     Comments: Forceful cough on exam   Skin:    General: Skin is warm and dry.  Neurological:     General: No focal deficit present.     Mental Status: She is alert and oriented to person, place, and time. Mental status is at baseline.     Comments: Using rolling walker    Psychiatric:        Attention and Perception: Attention and perception normal.        Mood and Affect: Mood and affect normal.        Speech: Speech normal.        Behavior: Behavior normal. Behavior is cooperative.        Thought Content: Thought content normal.        Cognition and Memory: Cognition and memory normal.        Judgment: Judgment normal.     Assessment   1. Gait abnormal likely 2/2 heel issues f/u with podiatry orthostatics neg today lying 120/72 hr 60, sitting 126/70 hr 61 standing 116/74 hr 62   2. Asthmatic bronchitis  3. HTN controlled but due to cough will d/c ACEI change to ARB 4. HM Plan   1. F/u podiatry  2. Refer [facility] 2nd opinion  Prednisone 20 mg x 7-10 days  Doxycycline tussionex  Change ACEI to ARB  Continue chronic meds rec use Advair bid (daily) had not used today  3. C/o quinalpril 40 and change to losartan 50 mg qd  4.  Flu shot 02/02/18 utdpna 23, prevnar  Tdap utd   consider shingrix in  future Given Rx twinrixin pastto get at pharmacy h/o fatty liver  DEXA 10/17/14 normal Out of age windowpapOb/gyn mammo neg5/20/19 negative Colonoscopy h/o polyps 02/22/17 prep not good needs to f/u and resch with [place] 05/31/17 Had due in 5 years from 05/31/17 [facility] GI colonoscopy with diverticulosis and polyps  Provider: Dr. Olivia Mackie [name]-Internal Medicine

## 2018-05-12 NOTE — Patient Instructions (Signed)
Stop quinapril 40 mg changing to losartan 50 mg daily  Steroids, antibiotics, cough medication, continue over the counter cough medication during the day  Referred to [facility] for 2nd opinion    Chronic Bronchitis Chronic bronchitis is long-lasting inflammation of the tubes that carry air into your lungs (bronchial tubes). This is inflammation that occurs:  On most days of the week.  For at least three months at a time.  Over a period of two years in a row. When the bronchial tubes are inflamed, they start to produce mucus. The inflammation and buildup of mucus make it more difficult to breathe. Chronic bronchitis is usually a permanent problem. It is one type of chronic obstructive pulmonary disease (COPD). People with chronic bronchitis are more likely to get frequent colds or respiratory infections. What are the causes? Chronic bronchitis most often occurs in people who:  Have chronic, severe asthma.  Have a history of smoking.  Have asthma and smoke.  Have certain lung diseases.  Have had long-term exposure to certain irritating fumes or chemicals. What are the signs or symptoms? Symptoms of chronic bronchitis may include:  A cough that brings up mucus (productive cough).  Shortness of breath.  Loud breathing (wheezing).  Chest discomfort.  Frequent (recurring) colds or respiratory infections. Certain things can trigger chronic bronchitis symptoms or make them worse, such as:  Infections.  Stopping certain medicines.  Smoking.  Exposure to chemicals. How is this diagnosed? This condition may be diagnosed based on:  Your symptoms and medical history.  A physical exam.  A chest X-ray.  Lung (pulmonary) function tests. How is this treated? There is no cure for chronic bronchitis, but treatment can help control your symptoms. Treatment may include:  Using a cool mist vaporizer or humidifier to make it easier to breathe.  Drinking more fluids.  Drinking more makes your mucus thinner, which may make it easier to breathe.  Lifestyle changes, such as eating a healthier diet and getting more exercise.  Medicines, such as: ? Inhalers to improve air flow in and out of your lungs. ? Antibiotics to treat any bacterial infections you have, such as:  Lung infection (pneumonia).  Sinus infection.  A sudden, severe (acute) episode of bronchitis.  Oxygen therapy.  Preventing infections by keeping up to date on vaccinations, including the pneumonia and flu vaccines.  Pulmonary rehabilitation. This is a program that helps you manage your breathing problems and improve your quality of life. It may last for up to 4-12 weeks and may include exercise programs, education, counseling, and treatment support. Follow these instructions at home: Medicines  Take over-the-counter and prescription medicines only as told by your health care provider.  If you were prescribed an antibiotic medicine, take it as told by your health care provider. Do not stop taking the antibiotic even if you start to feel better. Preventing infections  Get vaccinations as told by your health care provider. Make sure you get a flu shot (influenza vaccine) every year.  Wash your hands often with soap and water. If soap and water are not available, use hand sanitizer.  Avoid contact with people who have symptoms of a cold or the flu. Managing symptoms   Do not smoke, and avoid secondhand smoke. Exposure to cigarette smoke or irritating chemicals will make bronchitis worse. If you smoke and you need help quitting, ask your health care provider. Quitting smoking will help your lungs heal faster.  Use an inhaler, cool mist vaporizer, or humidifier as  told by your health care provider.  Avoid pollen, dust, animal dander, molds, smoke, and other things that cause shortness of breath or wheezing attacks.  Use oxygen therapy at home as directed. Follow instructions from your  health care provider about how to use oxygen safely and take precautions to prevent fire. Make sure you never smoke while using oxygen or allow others to smoke in your home.  Do not wait to get medical care if you have any concerning symptoms or trouble breathing. Waiting could cause permanent injury and may be life threatening. General instructions  Talk with your health care provider about what activities are safe for you and about possible exercise routines. Regular exercise is very important to help you feel better.  Drink enough fluids to keep your urine pale yellow.  Keep all follow-up visits as told by your health care provider. This is important. Contact a health care provider if:  You have coughing or shortness of breath that gets worse.  You have muscle aches.  You have chest pain.  Your mucus seems to get thicker.  Your mucus changes from clear or white to yellow, green, gray, or bloody. Get help right away if:  Your usual medicines do not stop your wheezing.  You have severe difficulty breathing. These symptoms may represent a serious problem that is an emergency. Do not wait to see if the symptoms will go away. Get medical help right away. Call your local emergency services (911 in the [place] Do not drive yourself to the hospital. Summary  Chronic bronchitis is long-lasting inflammation of the tubes that carry air into your lungs (bronchial tubes).  Chronic bronchitis is usually a permanent problem. It is one type of chronic obstructive pulmonary disease (COPD).  There is no cure for chronic bronchitis, but treatment can help control your symptoms.  Do not smoke, and avoid secondhand smoke. Exposure to cigarette smoke or irritating chemicals will make bronchitis worse. This information is not intended to replace advice given to you by your health care provider. Make sure you discuss any questions you have with your health care provider. Document Released: 12/25/2005  Document Revised: 01/27/2017 Document Reviewed: 01/27/2017 Elsevier Interactive Patient Education  2019 [employer].

## 2018-05-16 ENCOUNTER — Encounter: Payer: Self-pay | Admitting: Urology

## 2018-05-16 ENCOUNTER — Ambulatory Visit: Payer: PPO | Admitting: Urology

## 2018-05-16 VITALS — BP 124/67 | HR 67 | Ht 65.0 in | Wt 265.0 lb

## 2018-05-16 DIAGNOSIS — N3946 Mixed incontinence: Secondary | ICD-10-CM

## 2018-05-16 MED ORDER — FESOTERODINE FUMARATE ER 8 MG PO TB24: 8.0000 mg | ORAL_TABLET | Freq: Every day | ORAL | 11 refills | Status: DC

## 2018-05-16 NOTE — Progress Notes (Signed)
05/16/2018 9:37 AM   [name] 15-Apr-1950 [id number]  Referring provider: [name], [name], MD [place], [place] [address]  No chief complaint on file.   HPI: Patient recently saw our nurse practitioner and has been on Myrbetriq and oxybutynin ER 15 mg.  Patient has a past history of urinary tract infections and in May 2019 demonstrated a small stone nonobstructive left kidney.  Patient noted she did not want surgery and deferred physical therapy.  She was bothered by her incontinence.  The patient reports leaking with coughing and sneezing a significant amount.  She leaks when she goes from a sitting to standing position.  She has urge incontinence.  First thing in the morning she has significant foot on the floor syndrome.  I believe she has some mild bedwetting.  She wears 3 pads a day moderately wet.  When out in public the past can be soaked  She does not report prolapse symptoms but is failed the pessary.  She had a hysterectomy and bladder repair many years ago.  She has had a lithotripsies.  She uses a walker since she has lost her Achilles tendon on the left side due to complications from an infection over a year ago.  She is on oral hypoglycemics.  She thinks she gets 5 infections a year with pain with urination that respond favorably to antibiotics  On pelvic examination the patient had mild grade 2 hypermobility the bladder neck and a mild positive cough test.  She had no cystocele or appreciable rectocele.  She had significant obesity and a retropubic sling would not be ideal based upon her body habitus.  She has medical comorbidities and has lost over 110 pounds.  She has failed 2 medications.  She gets recurrent bladder infections.  The role of urodynamics discussed and ordered.  If her stress incontinence was ever treated I would recommend injectable based upon medical comorbidities and obesity and pelvic examination.  Her obesity may make the  threat of catheterization more challenging  Today Consider and incontinence are stable.  She has had positive and negative cultures On urodynamics maximum bladder capacity was 790 mL.  She was starting to have overflow incontinence.  Her bladder was stable on the oxybutynin and Myrbetriq.  At 300 mL her Valsalva leak point pressure is 38 cm of water with mild leakage.  Her cough leak point pressure was 80 cm of water with moderate leakage.  At 440 mL her cough leak point pressure was 72 cm water with moderate leakage.  She generated possibly a low pressure contraction of 5 cm of water but may have been artifact.  She voided 687 mL with a maximum of 18 mils per second and a residual was 100 mL.  EMG activity did not trace well during voiding.  Ladder neck descended approximately 1 cm  Is still on both medications and thinks oxybutynin bedtimes helps some but not him work that well.  She is able to get their first thing in the morning now.  When she gets up from sitting she leaks and she leaks with coughing.  She admits that she often has to strain to void   PMH: Past Medical History:  Diagnosis Date  . Abnormal antibody titer   . Anxiety and depression   . Arthritis   . Asthma   . Cystocele   . Depression   . Diabetes ([place])   . DVT (deep venous thrombosis) (H[facility])    Righ calf  .  Dysrhythmia   . Edema   . GERD (gastroesophageal reflux disease)   . Heart murmur    Echocardiogram June 2015: Mild MR (possible vegetation seen on TTE, not seen on TEE), normal LV size with moderate concentric LVH. Normal function EF 60-65%. Normal diastolic function. Mild LA dilation.  Marland Kitchen History of IBS   . History of methicillin resistant staphylococcus aureus (MRSA) 2008  . Hyperlipidemia   . Hypertension   . Hypothyroidism   . Insomnia   . Kidney stone    kidney stones with lithotripsy  . Neuropathy involving both lower extremities   . Obesity, Class II, BMI 35-39.9, with comorbidity   . Paroxysmal  atrial fibrillation ([place]) 2007   In June 2015, Cardiac Event Monitor: Mostly SR/sinus arrhythmia with PVCs that are frequent. Short bursts of A. fib lasting several minutes;; CHA2DS2-VASc Score = 5 (age, Female, PVD, DM, HTN)  . Peripheral vascular disease ([place])   . Sleep apnea    use C-PAP  . Urinary incontinence   . Venous stasis dermatitis of both lower extremities     Surgical History: Past Surgical History:  Procedure Laterality Date  . ANKLE SURGERY Right   . APPLICATION OF WOUND VAC Left 06/27/2015   Procedure: APPLICATION OF WOUND VAC ( POSSIBLE ) ;  Surgeon: [name], MD;  Location: [facility] ORS;  Service: Vascular;  Laterality: Left;  . APPLICATION OF WOUND VAC Left 09/25/2016   Procedure: APPLICATION OF WOUND VAC;  Surgeon: [name], DPM;  Location: [facility] ORS;  Service: Podiatry;  Laterality: Left;  . arm surgery     right    . CHOLECYSTECTOMY    . COLONOSCOPY    . COLONOSCOPY WITH PROPOFOL N/A 01/11/2017   Procedure: COLONOSCOPY WITH PROPOFOL;  Surgeon: [name], MD;  Location: [facility] ENDOSCOPY;  Service: Endoscopy;  Laterality: N/A;  . COLONOSCOPY WITH PROPOFOL N/A 02/22/2017   Procedure: COLONOSCOPY WITH PROPOFOL;  Surgeon: [name], MD;  Location: [facility]COPY;  Service: Endoscopy;  Laterality: N/A;  . COLONOSCOPY WITH PROPOFOL N/A 05/31/2017   Procedure: COLONOSCOPY WITH PROPOFOL;  Surgeon: [name], MD;  Location: [facility] ENDOSCOPY;  Service: Endoscopy;  Laterality: N/A;  . ESOPHAGOGASTRODUODENOSCOPY (EGD) WITH PROPOFOL N/A 01/11/2017   Procedure: ESOPHAGOGASTRODUODENOSCOPY (EGD) WITH PROPOFOL;  Surgeon: [name], MD;  Location: [facility] ENDOSCOPY;  Service: Endoscopy;  Laterality: N/A;  . HERNIA REPAIR     umbilical  . HIATAL HERNIA REPAIR    . I&D EXTREMITY Left 06/27/2015   Procedure: IRRIGATION AND DEBRIDEMENT EXTREMITY            ( CALF HEMATOMA ) POSSIBLE WOUND VAC;  Surgeon: [name], MD;  Location: [facility] ORS;  Service:  Vascular;  Laterality: Left;  . INCISION AND DRAINAGE OF WOUND Left 09/25/2016   Procedure: IRRIGATION AND DEBRIDEMENT - PARTIAL RESECTION OF ACHILLES TENDON WITH WOUND VAC APPLICATION;  Surgeon: [name], DPM;  Location: [facility] ORS;  Service: Podiatry;  Laterality: Left;  . IRRIGATION AND DEBRIDEMENT ABSCESS Left 09/07/2016   Procedure: IRRIGATION AND DEBRIDEMENT ABSCESS LEFT HEEL;  Surgeon: [name], DPM;  Location: [facility] ORS;  Service: Podiatry;  Laterality: Left;  . JOINT REPLACEMENT  2013   left knee replacement  . KIDNEY SURGERY Right    kidney stones  . LITHOTRIPSY    . LOWER EXTREMITY ANGIOGRAPHY Left 04/04/2018   Procedure: LOWER EXTREMITY ANGIOGRAPHY;  Surgeon: [name], MD;  Location: [facility];  Service: Cardiovascular;  Laterality: Left;  .  NM [facility] HX)  February 2017   Likely breast attenuation. LOW RISK study. Normal EF 55-60%.  . OTHER SURGICAL HISTORY     08/2016 or 09/2016 surgery on achilles tenso h/o staph infection heal. Dr. [name]  . removal of left hematoma Left    leg  . REVERSE SHOULDER ARTHROPLASTY Right 10/08/2015   Procedure: REVERSE SHOULDER ARTHROPLASTY;  Surgeon: [name], MD;  Location: [facility] ORS;  Service: Orthopedics;  Laterality: Right;  . SLEEVE GASTROPLASTY    . TONSILLECTOMY    . TOTAL KNEE ARTHROPLASTY    . TOTAL SHOULDER REPLACEMENT Right   . TRANSESOPHAGEAL ECHOCARDIOGRAM  08/08/2013   Mild LVH, EF 60-65%. Moderate LA dilation and mild RA dilation. Mild MR with no evidence of stenosis and no evidence of endocarditis. A false chordae is noted.  . TRANSTHORACIC ECHOCARDIOGRAM  08/03/2013   Mild-Moderate concentric LVH, EF 60-65%. Normal diastolic function. Mild LA dilation. Mild MR with possible vegetation  - not confirmed on TEE   . ULNAR NERVE TRANSPOSITION Right 08/06/2016   Procedure: ULNAR NERVE DECOMPRESSION/TRANSPOSITION;  Surgeon: [name], MD;  Location: [facility] ORS;  Service: Orthopedics;   Laterality: Right;  . UPPER GI ENDOSCOPY    . VAGINAL HYSTERECTOMY      Home Medications:  Allergies as of 05/16/2018      Reactions   Penicillins Hives, Shortness Of Breath, Swelling, Other (See Comments)   Facial swelling Has patient had a PCN reaction causing immediate rash, facial/tongue/throat swelling, SOB or lightheadedness with hypotension: Yes Has patient had a PCN reaction causing severe rash involving mucus membranes or skin necrosis: Yes Has patient had a PCN reaction that required hospitalization No Has patient had a PCN reaction occurring within the last 10 years: No If all of the above answers are "NO", then may proceed with Cephalosporin use.   Aspirin Hives   Morphine And Related Other (See Comments)   Patient becomes very confused   Terramycin [oxytetracycline] Hives      Medication List       Accurate as of May 16, 2018  9:37 AM. Always use your most recent med list.        albuterol (2.5 MG/3ML) 0.083% nebulizer solution Commonly known as:  PROVENTIL Take 3 mLs (2.5 mg total) by nebulization every 6 (six) hours as needed for wheezing or shortness of breath.   albuterol 108 (90 Base) MCG/ACT inhaler Commonly known as:  PROVENTIL HFA;VENTOLIN HFA Inhale 1-2 puffs into the lungs every 4 (four) hours as needed for wheezing or shortness of breath.   ALPRAZolam 0.5 MG tablet Commonly known as:  XANAX Take 1 tablet (0.5 mg total) by mouth 2 (two) times daily as needed for anxiety.   amLODipine 5 MG tablet Commonly known as:  NORVASC Take 1 tablet (5 mg total) by mouth daily.   buPROPion 300 MG 24 hr tablet Commonly known as:  WELLBUTRIN XL Take 1 tablet (300 mg total) by mouth daily.   CALCIUM + D PO Take 1 tablet by mouth 2 (two) times daily.   chlorpheniramine-HYDROcodone 10-8 MG/5ML Suer Commonly known as:  TUSSIONEX PENNKINETIC ER Take 5 mLs by mouth at bedtime as needed for cough.   clobetasol cream 0.05 % Commonly known as:   TEMOVATE Apply 1 application topically 2 (two) times daily. To arms   dexlansoprazole 60 MG capsule Commonly known as:  DEXILANT Take 1 capsule (60 mg total) by mouth daily. Minutes before meal (lunch)   doxycycline 100 MG  tablet Commonly known as:  VIBRA-TABS Take 1 tablet (100 mg total) by mouth 2 (two) times daily. Food   DULoxetine 60 MG capsule Commonly known as:  CYMBALTA Take 1 capsule (60 mg total) by mouth daily.   estradiol 0.1 MG/GM vaginal cream Commonly known as:  ESTRACE Apply 0.5mg  (pea-sized amount)  just inside the vaginal introitus with a finger-tip on Monday, Wednesday and Friday nights,   Fluticasone-Salmeterol 250-50 MCG/DOSE Aepb Commonly known as:  ADVAIR Inhale 1 puff into the lungs 2 (two) times daily.   gabapentin 800 MG tablet Commonly known as:  NEURONTIN Take 1 tablet (800 mg total) by mouth 4 (four) times daily.   hydrochlorothiazide 12.5 MG tablet Commonly known as:  HYDRODIURIL Take 1 tablet (12.5 mg total) by mouth daily. In am   HYDROcodone-acetaminophen 5-325 MG tablet Commonly known as:  NORCO/VICODIN Take 1 tablet by mouth every 8 (eight) hours as needed for severe pain.   levocetirizine 5 MG tablet Commonly known as:  XYZAL TAKE 1 TABLET BY MOUTH EVERY EVENING   levothyroxine 175 MCG tablet Commonly known as:  SYNTHROID, LEVOTHROID Take 1 tablet (175 mcg total) by mouth daily before breakfast.   losartan 50 MG tablet Commonly known as:  COZAAR Take 1 tablet (50 mg total) by mouth daily. Do not take quinapril 40   meloxicam 15 MG tablet Commonly known as:  MOBIC Take 1 tablet (15 mg total) by mouth daily as needed.   metFORMIN 1000 MG tablet Commonly known as:  GLUCOPHAGE TAKE ONE TABLET BY MOUTH TWICE DAILY   metoprolol succinate 50 MG 24 hr tablet Commonly known as:  TOPROL-XL Take 1 tablet (50 mg total) by mouth daily. Take with or immediately following a meal.   montelukast 10 MG tablet Commonly known as:   SINGULAIR Take 1 tablet (10 mg total) by mouth daily.   mupirocin ointment 2 % Commonly known as:  BACTROBAN Apply 1 application topically 2 (two) times daily. Skin wounds   MYRBETRIQ 50 MG Tb24 tablet Generic drug:  mirabegron ER TAKE ONE TABLET BY MOUTH EVERY DAY   nystatin cream Commonly known as:  MYCOSTATIN Apply 1 application topically 2 (two) times daily. Mouth prn for 7 days as needed   oxybutynin 15 MG 24 hr tablet Commonly known as:  DITROPAN XL TAKE ONE TABLET BY MOUTH AT BEDTIME   PRADAXA 150 MG Caps capsule Generic drug:  dabigatran TAKE 1 CAPSULE BY MOUTH TWICE DAILY   predniSONE 20 MG tablet Commonly known as:  DELTASONE Take 1 tablet (20 mg total) by mouth daily with breakfast. X 7-10 days   Tiotropium Bromide Monohydrate 1.25 MCG/ACT Aers Commonly known as:  SPIRIVA RESPIMAT Inhale 2 puffs into the lungs daily.   VOLTAREN 1 % Gel Generic drug:  diclofenac sodium Apply 1 application topically daily as needed (pain).   zinc oxide 20 % ointment Apply 1 application topically as needed for irritation.       Allergies:  Allergies  Allergen Reactions  . Penicillins Hives, Shortness Of Breath, Swelling and Other (See Comments)    Facial swelling Has patient had a PCN reaction causing immediate rash, facial/tongue/throat swelling, SOB or lightheadedness with hypotension: Yes Has patient had a PCN reaction causing severe rash involving mucus membranes or skin necrosis: Yes Has patient had a PCN reaction that required hospitalization No Has patient had a PCN reaction occurring within the last 10 years: No If all of the above answers are "NO", then may proceed with Cephalosporin use.   Marland Kitchen  Aspirin Hives  . Morphine And Related Other (See Comments)    Patient becomes very confused  . Terramycin [Oxytetracycline] Hives    Family History: Family History  Problem Relation Age of Onset  . Skin cancer Father   . Diabetes Father   . Hypertension Father   .  Peripheral vascular disease Father   . Cancer Father   . Cerebral aneurysm Father   . Alcohol abuse Father   . Varicose Veins Mother   . Kidney disease Mother   . Arthritis Mother   . Mental illness Sister   . Cancer Maternal Aunt        breast  . Breast cancer Maternal Aunt   . Arthritis Maternal Grandmother   . Hypertension Maternal Grandmother   . Diabetes Maternal Grandmother   . Arthritis Maternal Grandfather   . Heart disease Maternal Grandfather   . Hypertension Maternal Grandfather   . Arthritis Paternal Grandmother   . Hypertension Paternal Grandmother   . Diabetes Paternal Grandmother   . Arthritis Paternal Grandfather   . Hypertension Paternal Grandfather   . Bladder Cancer Neg Hx   . Kidney cancer Neg Hx     Social History:  reports that she has never smoked. She has never used smokeless tobacco. She reports that she does not drink alcohol or use drugs.  ROS:                                        Physical Exam: There were no vitals taken for this visit.  Constitutional:  Alert and oriented, No acute distress.   Laboratory Data: Lab Results  Component Value Date   WBC 6.3 05/12/2018   HGB 12.4 05/12/2018   HCT 37.9 05/12/2018   MCV 89.3 05/12/2018   PLT 276.0 05/12/2018    Lab Results  Component Value Date   CREATININE 1.10 05/12/2018    No results found for: PSA  No results found for: TESTOSTERONE  Lab Results  Component Value Date   HGBA1C 6.5 05/12/2018    Urinalysis    Component Value Date/Time   COLORURINE YELLOW 08/02/2017 1427   APPEARANCEUR Clear 02/21/2018 0904   LABSPEC >=1.030 (A) 08/02/2017 1427   PHURINE 5.5 08/02/2017 1427   GLUCOSEU Negative 02/21/2018 0904   GLUCOSEU NEGATIVE 08/02/2017 1427   HGBUR MODERATE (A) 08/02/2017 1427   BILIRUBINUR Negative 02/21/2018 0904   BILIRUBINUR 1+ 09/15/2017 1319   KETONESUR TRACE (A) 08/02/2017 1427   PROTEINUR Negative 02/21/2018 0904   PROTEINUR NEGATIVE  09/05/2016 0933   UROBILINOGEN 0.2 09/15/2017 1319   UROBILINOGEN 0.2 08/02/2017 1427   NITRITE Negative 02/21/2018 0904   NITRITE NEGATIVE 08/02/2017 1427   LEUKOCYTESUR Negative 02/21/2018 0904    Pertinent Imaging:   Assessment & Plan: Patient will be seen back in 6 weeks on Toviaz 8 mg samples and prescription in the morning and oxybutynin at night.  Percutaneous tibial nerve stimulation is an option.  I mention urethral injectables as an option.  She should not have a sling for reasons mentioned and she also strains to urinate.  Even an injectable might tipped the balance.  It may be reasonable to try percutaneous tibial nerve stimulation first  There are no diagnoses linked to this encounter.  No follow-ups on file.  Reece Packer, MD  [facility] [address] [place], [place] [address] [phone]

## 2018-05-17 ENCOUNTER — Other Ambulatory Visit: Payer: Self-pay | Admitting: Internal Medicine

## 2018-05-17 DIAGNOSIS — K219 Gastro-esophageal reflux disease without esophagitis: Secondary | ICD-10-CM

## 2018-05-17 MED ORDER — DEXLANSOPRAZOLE 30 MG PO CPDR: 30.0000 mg | DELAYED_RELEASE_CAPSULE | Freq: Every day | ORAL | 3 refills | Status: DC

## 2018-05-18 ENCOUNTER — Telehealth: Payer: Self-pay | Admitting: Internal Medicine

## 2018-05-18 NOTE — Telephone Encounter (Signed)
Copied from [place] [id number]. Topic: General - Inquiry >> May 18, 2018  1:33 PM Margot Ables wrote: Pt ate light cereal yesterday morning, last night at supper threw up, had some ginger ale, this morning had oatmeal and nauseous. Pt was in on 2/20 and Dr. [name] wiped down the whole room with alcohol wipes and stated she was being cautious because she had just treated 3 patients who tested positive for the flu. Pt is worried she has it and symptoms started 2/25. She is asking if she should take some kind of medication. Pt uses a walker and being sick is an extra challenge. Pts husband also going thru radiation. Please call to advise.  [place] [place] [facility] [place] [phone] (Phone) [phone] (Fax)

## 2018-05-19 ENCOUNTER — Other Ambulatory Visit: Payer: Self-pay | Admitting: Internal Medicine

## 2018-05-19 ENCOUNTER — Ambulatory Visit: Payer: PPO | Attending: Nurse Practitioner | Admitting: Nurse Practitioner

## 2018-05-19 ENCOUNTER — Other Ambulatory Visit: Payer: Self-pay

## 2018-05-19 ENCOUNTER — Telehealth: Payer: Self-pay | Admitting: *Deleted

## 2018-05-19 ENCOUNTER — Encounter: Payer: Self-pay | Admitting: Nurse Practitioner

## 2018-05-19 VITALS — BP 129/68 | HR 71 | Temp 98.1°F | Ht 65.0 in | Wt 265.0 lb

## 2018-05-19 DIAGNOSIS — G894 Chronic pain syndrome: Secondary | ICD-10-CM | POA: Insufficient documentation

## 2018-05-19 DIAGNOSIS — M150 Primary generalized (osteo)arthritis: Secondary | ICD-10-CM

## 2018-05-19 DIAGNOSIS — F418 Other specified anxiety disorders: Secondary | ICD-10-CM | POA: Insufficient documentation

## 2018-05-19 DIAGNOSIS — Z79891 Long term (current) use of opiate analgesic: Secondary | ICD-10-CM | POA: Insufficient documentation

## 2018-05-19 DIAGNOSIS — E1142 Type 2 diabetes mellitus with diabetic polyneuropathy: Secondary | ICD-10-CM | POA: Insufficient documentation

## 2018-05-19 DIAGNOSIS — R112 Nausea with vomiting, unspecified: Secondary | ICD-10-CM

## 2018-05-19 DIAGNOSIS — M159 Polyosteoarthritis, unspecified: Secondary | ICD-10-CM

## 2018-05-19 DIAGNOSIS — E1351 Other specified diabetes mellitus with diabetic peripheral angiopathy without gangrene: Secondary | ICD-10-CM | POA: Insufficient documentation

## 2018-05-19 DIAGNOSIS — M8949 Other hypertrophic osteoarthropathy, multiple sites: Secondary | ICD-10-CM

## 2018-05-19 MED ORDER — PREGABALIN 25 MG PO CAPS: 25.0000 mg | ORAL_CAPSULE | Freq: Three times a day (TID) | ORAL | 0 refills | Status: DC

## 2018-05-19 MED ORDER — HYDROCODONE-ACETAMINOPHEN 5-325 MG PO TABS: 1.0000 | ORAL_TABLET | Freq: Three times a day (TID) | ORAL | 0 refills | Status: DC | PRN

## 2018-05-19 MED ORDER — GABAPENTIN 800 MG PO TABS: 800.0000 mg | ORAL_TABLET | Freq: Four times a day (QID) | ORAL | 0 refills | Status: DC

## 2018-05-19 MED ORDER — DULOXETINE HCL 60 MG PO CPEP: 60.0000 mg | ORAL_CAPSULE | Freq: Every day | ORAL | 0 refills | Status: DC

## 2018-05-19 MED ORDER — ONDANSETRON HCL 4 MG PO TABS: 4.0000 mg | ORAL_TABLET | Freq: Three times a day (TID) | ORAL | 0 refills | Status: DC | PRN

## 2018-05-19 NOTE — Telephone Encounter (Signed)
Left message for patient to return call back. PEC may give information.  

## 2018-05-19 NOTE — Telephone Encounter (Signed)
Pt requiring PA for pradaza 150 mg tablet. I have contacted [phone] to initiate PA overt the phone.  Pt is requiring PA due to Pradaxa being considered non formulary.

## 2018-05-19 NOTE — Telephone Encounter (Signed)
It is likely the antibiotics making her nauseated She would be running fever, body aches sore throat if flu  Sent in Zofran to pharmacy as needed and make sure she eats with Doxycycline full meal  [place]

## 2018-05-19 NOTE — Telephone Encounter (Signed)
I have contacted pharmacy and pt will only have to pay $8 dollar copay.

## 2018-05-19 NOTE — Progress Notes (Signed)
Patient's Name: [name]  MRN: [id number]  Referring Provider: Orland Mustard *  DOB: 01/04/1951  PCP: [name], [name], MD  DOS: 05/19/2018  Note by: Dionisio David, NP  Service setting: Ambulatory outpatient  Specialty: Interventional Pain Management  Location: [facility] (AMB) Pain Management Facility    Patient type: Established   HPI  Reason for Visit: [name] is a 68 y.o. year old, female patient, who comes today with a chief complaint of Foot Pain Last Appointment: Her last appointment at our practice was on 02/22/2018. I last saw her on 02/22/2018.  Pain Assessment: Today, [name] describes the severity of the Chronic pain as a 5 /10. She indicates the location/referral of the pain to be Foot Left/Denies. Onset was: More than a month ago. The quality of pain is described as Aching, Burning, Tingling. Temporal description, or timing of pain is: Constant. Possible modifying factors: Medications and rest. [name]  height is '5\' 5"'  (1.651 m) and weight is 265 lb (120.2 kg). Her temperature is 98.1 F (36.7 C). Her blood pressure is 129/68 and her pulse is 71. Her oxygen saturation is 94%.  She feels like the Gabapentin is getting stuck. She feels like she was to d  Controlled Substance Pharmacotherapy Assessment REMS (Risk Evaluation and Mitigation Strategy)  Analgesic:Hydrocodone/APAP 5/325 one tablet every 8 hours (15 mg/day) MME/day:15 mg/day  Chauncey Fischer, RN  05/19/2018  1:46 PM  Sign when Signing Visit Nursing Pain Medication Assessment:  Safety precautions to be maintained throughout the outpatient stay will include: orient to surroundings, keep bed in low position, maintain call bell within reach at all times, provide assistance with transfer out of bed and ambulation.  Medication Inspection Compliance: Pill count conducted under aseptic conditions, in front of the patient. Neither the pills nor the bottle was removed from the patient's sight at any  time. Once count was completed pills were immediately returned to the patient in their original bottle.  Medication: Hydrocodone/APAP Pill/Patch Count: 67 of 90 pills remain Pill/Patch Appearance: Markings consistent with prescribed medication Bottle Appearance: Standard pharmacy container. Clearly labeled. Filled Date: 2 / 17 / 2020 Last Medication intake:  Today   Pharmacokinetics: Liberation and absorption (onset of action): WNL Distribution (time to peak effect): WNL Metabolism and excretion (duration of action): WNL         Pharmacodynamics: Desired effects: Analgesia: [name] reports >50% benefit. Functional ability: Patient reports that medication allows her to accomplish basic ADLs Clinically meaningful improvement in function (CMIF): Sustained CMIF goals met Perceived effectiveness: Described as relatively effective, allowing for increase in activities of daily living (ADL) Undesirable effects: Side-effects or Adverse reactions: None reported Monitoring: [place] PMP: Online review of the past 11-monthperiod conducted. Compliant with practice rules and regulations Last UDS on record: Summary  Date Value Ref Range Status  08/24/2017 FINAL  Final    Comment:    ==================================================================== TOXASSURE SELECT 13 (MW) ==================================================================== Test                             Result       Flag       Units Drug Present and Declared for Prescription Verification   Alprazolam                     71           EXPECTED   ng/mg creat   Alpha-hydroxyalprazolam  114          EXPECTED   ng/mg creat    Source of alprazolam is a scheduled prescription medication.    Alpha-hydroxyalprazolam is an expected metabolite of alprazolam.   Hydrocodone                    119          EXPECTED   ng/mg creat   Norhydrocodone                 175          EXPECTED   ng/mg creat    Sources of hydrocodone include  scheduled prescription    medications. Norhydrocodone is an expected metabolite of    hydrocodone. ==================================================================== Test                      Result    Flag   Units      Ref Range   Creatinine              63               mg/dL      >=20 ==================================================================== Declared Medications:  The flagging and interpretation on this report are based on the  following declared medications.  Unexpected results may arise from  inaccuracies in the declared medications.  **Note: The testing scope of this panel includes these medications:  Alprazolam  Hydrocodone (Hydrocodone-Acetaminophen)  **Note: The testing scope of this panel does not include following  reported medications:  Acetaminophen (Hydrocodone-Acetaminophen)  Albuterol  Amlodipine  Bupropion  Clobetasol  Cyanocobalamin  Dabigatran (Pradaxa)  Dexlansoprazole  Duloxetine  Estradiol  Ferrous Gluconate  Fluconazole  Fluticasone (Advair)  Gabapentin  Iron (Ferrous Sulfate)  Levocetirizine  Levofloxacin  Levothyroxine  Metformin  Metoprolol  Mirabegron  Montelukast  Multivitamin  Ondansetron  Oxybutynin  Potassium  Quinapril  Salmeterol (Advair)  Topical  Triamcinolone ==================================================================== For clinical consultation, please call [phone]. ====================================================================    UDS interpretation: Compliant          Medication Assessment Form: Reviewed. Patient indicates being compliant with therapy Treatment compliance: Compliant Risk Assessment Profile: Aberrant behavior: non-compliance with practice rules and regulations Comorbid factors increasing risk of overdose: caucasian, concomitant use of Benzodiazepines, family history of addiction and sleep apnea Opioid risk tool (ORT):  Opioid Risk  05/19/2018  Alcohol 0  Illegal Drugs 0   Rx Drugs 0  Alcohol 0  Illegal Drugs 0  Rx Drugs 0  Age between 16-45 years  0  History of Preadolescent Sexual Abuse 0  Psychological Disease 0  ADD -  OCD -  Bipolar -  Depression 0  Opioid Risk Tool Scoring 0  Opioid Risk Interpretation Low Risk    ORT Scoring interpretation table:  Score <3 = Low Risk for SUD  Score between 4-7 = Moderate Risk for SUD  Score >8 = High Risk for Opioid Abuse   Risk of substance use disorder (SUD): Moderate Sharing medication with husband  Risk Mitigation Strategies:  Patient Counseling: Covered Patient-Prescriber Agreement (PPA): Present and active  Notification to other healthcare providers: Done  Pharmacologic Plan: No change in therapy, at this time.           Will continue to monitor UDS closely secondary to husband admitting to using patients medication.  ROS  Constitutional: Denies any fever or chills Gastrointestinal: No reported hemesis, hematochezia, vomiting, or acute GI distress Musculoskeletal: Denies any acute  onset joint swelling, redness, loss of ROM, or weakness Neurological: No reported episodes of acute onset apraxia, aphasia, dysarthria, agnosia, amnesia, paralysis, loss of coordination, or loss of consciousness  Medication Review  ALPRAZolam, Calcium Citrate-Vitamin D, DULoxetine, Dexlansoprazole, Fluticasone-Salmeterol, HYDROcodone-acetaminophen, Tiotropium Bromide Monohydrate, albuterol, amLODipine, buPROPion, clobetasol cream, dabigatran, diclofenac sodium, estradiol, fesoterodine, gabapentin, hydrochlorothiazide, levocetirizine, levothyroxine, losartan, metFORMIN, metoprolol succinate, montelukast, mupirocin ointment, nystatin cream, ondansetron, oxybutynin, pregabalin, and zinc oxide  History Review  Allergy: [name] is allergic to penicillins; aspirin; morphine and related; and terramycin [oxytetracycline]. Drug: [name]  reports no history of drug use. Alcohol:  reports no history of alcohol  use. Tobacco:  reports that she has never smoked. She has never used smokeless tobacco. Social: [name]  reports that she has never smoked. She has never used smokeless tobacco. She reports that she does not drink alcohol or use drugs. Medical:  has a past medical history of Abnormal antibody titer, Anxiety and depression, Arthritis, Asthma, Cystocele, Depression, Diabetes ([place]), DVT (deep venous thrombosis) ([place]), Dysrhythmia, Edema, GERD (gastroesophageal reflux disease), Heart murmur, History of IBS, History of methicillin resistant staphylococcus aureus (MRSA) (2008), Hyperlipidemia, Hypertension, Hypothyroidism, Insomnia, Kidney stone, Neuropathy involving both lower extremities, Obesity, Class II, BMI 35-39.9, with comorbidity, Paroxysmal atrial fibrillation ([place]) (2007), Peripheral vascular disease ([place]), Sleep apnea, Urinary incontinence, and Venous stasis dermatitis of both lower extremities. Surgical: [name]  has a past surgical history that includes Ankle surgery (Right); Vaginal hysterectomy; Sleeve Gastroplasty; Lithotripsy; Kidney surgery (Right); transthoracic echocardiogram (08/03/2013); transesophageal echocardiogram (08/08/2013); Tonsillectomy; Total knee arthroplasty; Hiatal hernia repair; Cholecystectomy; I&D extremity (Left, 06/27/2015); Application if wound vac (Left, 06/27/2015); removal of left hematoma (Left); NM GATED MYOVIEW [facility] HX) (February 2017); Reverse shoulder arthroplasty (Right, 10/08/2015); Hernia repair; Ulnar nerve transposition (Right, 08/06/2016); arm surgery; Irrigation and debridement abscess (Left, 09/07/2016); Incision and drainage of wound (Left, 09/25/2016); Application if wound vac (Left, 09/25/2016); Colonoscopy; Upper gi endoscopy; Esophagogastroduodenoscopy (egd) with propofol (N/A, 01/11/2017); Colonoscopy with propofol (N/A, 01/11/2017); Colonoscopy with propofol (N/A, 02/22/2017); OTHER SURGICAL HISTORY; Total shoulder replacement (Right); Joint replacement  (2013); Colonoscopy with propofol (N/A, 05/31/2017); and Lower Extremity Angiography (Left, 04/04/2018). Family: family history includes Alcohol abuse in her father; Arthritis in her maternal grandfather, maternal grandmother, mother, paternal grandfather, and paternal grandmother; Breast cancer in her maternal aunt; Cancer in her father and maternal aunt; Cerebral aneurysm in her father; Diabetes in her father, maternal grandmother, and paternal grandmother; Heart disease in her maternal grandfather; Hypertension in her father, maternal grandfather, maternal grandmother, paternal grandfather, and paternal grandmother; Kidney disease in her mother; Mental illness in her sister; Peripheral vascular disease in her father; Skin cancer in her father; Varicose Veins in her mother. Problem List: [name] has Peripheral vascular disease due to secondary diabetes mellitus ([place]); DM (diabetes mellitus) type II, controlled, with peripheral vascular disorder ([place]); Diabetic peripheral neuropathy associated with type 2 diabetes mellitus ([place]); Other shoulder lesions (Right); Osteoarthritis, multiple sites; Chronic right shoulder pain; and Spinal stenosis of thoracic region on their pertinent problem list.  Lab Review  Kidney Function Lab Results  Component Value Date   BUN 24 (H) 05/12/2018   CREATININE 1.10 05/12/2018   BCR 20 10/10/2014   GFRAA >60 04/04/2018   GFRNONAA 55 (L) 04/04/2018  Liver Function Lab Results  Component Value Date   AST 14 05/12/2018   ALT 11 05/12/2018   ALBUMIN 4.0 05/12/2018  Note: Above Lab results reviewed.  Imaging Review  DG Sacrum/Coccyx CLINICAL DATA:  Fall, landed on tailbone  EXAM: SACRUM AND COCCYX - 2+ VIEW  COMPARISON:  05/24/2017  FINDINGS: Technically suboptimal study. I see no definite sacral or coccygeal fracture. No visible focal bone lesion.  IMPRESSION: No visible acute bony abnormality.  Limited study by technique.  Electronically Signed   By:  Rolm Baptise M.D.   On: 04/19/2018 22:00 Note: Reviewed        Physical Exam  General appearance: Well nourished, well developed, and well hydrated. In no apparent acute distress Mental status: Alert, oriented x 3 (person, place, & time)       Respiratory: No evidence of acute respiratory distress Eyes: PERLA Vitals: BP 129/68   Pulse 71   Temp 98.1 F (36.7 C)   Ht '5\' 5"'  (1.651 m)   Wt 265 lb (120.2 kg)   SpO2 94%   BMI 44.10 kg/m  BMI: Estimated body mass index is 44.1 kg/m as calculated from the following:   Height as of this encounter: '5\' 5"'  (1.651 m).   Weight as of this encounter: 265 lb (120.2 kg). Ideal: Ideal body weight: 57 kg (125 lb 10.6 oz) Adjusted ideal body weight: 82.3 kg (181 lb 6.4 oz)   Gait & Posture Assessment  Ambulation: Patient ambulates using a walker Gait: Relatively normal for age and body habitus Posture: WNL  Lower Extremity Exam    Side: Right lower extremity  Side: Left lower extremity  Stability: No instability observed          Stability: No instability observed          Skin & Extremity Inspection: Skin color, temperature, and hair growth are WNL. No peripheral edema or cyanosis. No masses, redness, swelling, asymmetry, or associated skin lesions. No contractures.  Skin & Extremity Inspection:open wound to left heel  Functional ROM: Unrestricted ROM                  Functional ROM: Unrestricted ROM                  Muscle Tone/Strength: Functionally intact. No obvious neuro-muscular anomalies detected.  Muscle Tone/Strength: Functionally intact. No obvious neuro-muscular anomalies detected.  Sensory (Neurological): Unimpaired        Sensory (Neurological): Unimpaired        Palpation: No palpable anomalies  Palpation: No palpable anomalies   Assessment   Status Diagnosis  Persistent Controlled Controlled 1. Diabetic peripheral neuropathy associated with type 2 diabetes mellitus ([place])   2. Primary osteoarthritis involving multiple joints    3. Depression with anxiety   4. Peripheral vascular disease due to secondary diabetes mellitus ([place])   5. Chronic pain syndrome   6. Long term current use of opiate analgesic      Updated Problems: No problems updated.  Plan of Care  Pharmacotherapy (Medications Ordered): Meds ordered this encounter  Medications  . DULoxetine (CYMBALTA) 60 MG capsule    Sig: Take 1 capsule (60 mg total) by mouth daily.    Dispense:  90 capsule    Refill:  0    Do not place this medication, or any other prescription from our practice, on "Automatic Refill". Patient may have prescription filled one day early if pharmacy is closed on scheduled refill date.    Order Specific Question:   Supervising Provider    Answer:   Milinda Pointer [phone]  . gabapentin (NEURONTIN) 800 MG tablet    Sig: Take 1 tablet (800 mg total) by mouth 4 (four) times daily.    Dispense:  360  tablet    Refill:  0    Do not place this medication, or any other prescription from our practice, on "Automatic Refill". Patient may have prescription filled one day early if pharmacy is closed on scheduled refill date.    Order Specific Question:   Supervising Provider    Answer:   Milinda Pointer [phone]  . HYDROcodone-acetaminophen (NORCO/VICODIN) 5-325 MG tablet    Sig: Take 1 tablet by mouth every 8 (eight) hours as needed for up to 30 days for severe pain.    Dispense:  90 tablet    Refill:  0    Do not add this medication to the electronic "Automatic Refill" notification system. Patient may have prescription filled one day early if pharmacy is closed on scheduled refill date.    Order Specific Question:   Supervising Provider    Answer:   Milinda Pointer [phone]  . HYDROcodone-acetaminophen (NORCO/VICODIN) 5-325 MG tablet    Sig: Take 1 tablet by mouth every 8 (eight) hours as needed for up to 30 days for severe pain.    Dispense:  90 tablet    Refill:  0    Do not add this medication to the electronic "Automatic  Refill" notification system. Patient may have prescription filled one day early if pharmacy is closed on scheduled refill date.    Order Specific Question:   Supervising Provider    Answer:   Milinda Pointer [phone]  . HYDROcodone-acetaminophen (NORCO/VICODIN) 5-325 MG tablet    Sig: Take 1 tablet by mouth every 8 (eight) hours as needed for up to 30 days for severe pain.    Dispense:  90 tablet    Refill:  0    Do not place this medication, or any other prescription from our practice, on "Automatic Refill". Patient may have prescription filled one day early if pharmacy is closed on scheduled refill date.    Order Specific Question:   Supervising Provider    Answer:   Milinda Pointer [phone]  . pregabalin (LYRICA) 25 MG capsule    Sig: Take 1 capsule (25 mg total) by mouth 3 (three) times daily.    Dispense:  90 capsule    Refill:  0    Do not place this medication, or any other prescription from our practice, on "Automatic Refill". Patient may have prescription filled one day early if pharmacy is closed on scheduled refill date.    Order Specific Question:   Supervising Provider    Answer:   Milinda Pointer [[id number]]   Administered today: [name] had no medications administered during this visit.  Orders:  Orders Placed This Encounter  Procedures  . ToxASSURE Select 13 (MW), Urine    Volume: 30 ml(s). Minimum 3 ml of urine is needed. Document temperature of fresh sample. Indications: Long term (current) use of opiate analgesic (T70.017)   Interventional options: Planned follow-up:   Not at this time.  Plan: Return in about 4 weeks (around 06/16/2018) for MedMgmt, w/ Dr. Dossie Arbour.   Considering:  Diagnostic Right suprascapular nerve block Diagnostic right intra-articular knee injection Possible series of 5 Hyalgan knee injections Diagnostic right-sided genicular nerve block Possible right-sided genicular nerve radiofrequencyablation  Palliative right  intra-articular shoulder joint injection Diagnostic right-sided suprascapular nerve block Possible right-sided suprascapular nerve radiofrequencyablation  Palliative right lateral femoral cutaneous nerve block Palliative repeat right sided lateral femoral continuous nerve radiofrequencyablation   Palliative PRN treatment(s):  Diagnostic right intra-articular knee injection Possible series of 5 Hyalgan knee injections Diagnostic right-sided  genicular nerve block Palliative right intra-articular shoulder joint injection Diagnostic right-sided suprascapular nerve block Palliative right lateral femoral cutaneous nerve block Palliative repeat right sided lateral femoral continuous nerve radiofrequencyablation   Note by: Dionisio David, NP Date: 05/19/2018; Time: 2:55 PM

## 2018-05-19 NOTE — Telephone Encounter (Signed)
Patient called, left VM to return call to the office for results.

## 2018-05-19 NOTE — Telephone Encounter (Signed)
Call placed to patient. Left VM to return call to office.  [name], [name], MD  to Babs Bertin, CMA       7:39 AM  Note    It is likely the antibiotics making her nauseated She would be running fever, body aches sore throat if flu  Sent in Zofran to pharmacy as needed and make sure she eats with Doxycycline full meal

## 2018-05-19 NOTE — Patient Instructions (Signed)
____________________________________________________________________________________________  Medication Rules  Purpose: To inform patients, and their family members, of our rules and regulations.  Applies to: All patients receiving prescriptions (written or electronic).  Pharmacy of record: Pharmacy where electronic prescriptions will be sent. If written prescriptions are taken to a different pharmacy, please inform the nursing staff. The pharmacy listed in the electronic medical record should be the one where you would like electronic prescriptions to be sent.  Electronic prescriptions: In compliance with the [place] Strengthen Opioid Misuse Prevention (STOP) Act of 2017 (Session Law 2017-74/H243), effective March 23, 2018, all controlled substances must be electronically prescribed. Calling prescriptions to the pharmacy will cease to exist.  Prescription refills: Only during scheduled appointments. Applies to all prescriptions.  NOTE: The following applies primarily to controlled substances (Opioid* Pain Medications).   Patient's responsibilities: 1. Pain Pills: Bring all pain pills to every appointment (except for procedure appointments). 2. Pill Bottles: Bring pills in original pharmacy bottle. Always bring the newest bottle. Bring bottle, even if empty. 3. Medication refills: You are responsible for knowing and keeping track of what medications you take and those you need refilled. The day before your appointment: write a list of all prescriptions that need to be refilled. The day of the appointment: give the list to the admitting nurse. Prescriptions will be written only during appointments. No prescriptions will be written on procedure days. If you forget a medication: it will not be "Called in", "Faxed", or "electronically sent". You will need to get another appointment to get these prescribed. No early refills. Do not call asking to have your prescription filled  early. 4. Prescription Accuracy: You are responsible for carefully inspecting your prescriptions before leaving our office. Have the discharge nurse carefully go over each prescription with you, before taking them home. Make sure that your name is accurately spelled, that your address is correct. Check the name and dose of your medication to make sure it is accurate. Check the number of pills, and the written instructions to make sure they are clear and accurate. Make sure that you are given enough medication to last until your next medication refill appointment. 5. Taking Medication: Take medication as prescribed. When it comes to controlled substances, taking less pills or less frequently than prescribed is permitted and encouraged. Never take more pills than instructed. Never take medication more frequently than prescribed.  6. Inform other Doctors: Always inform, all of your healthcare providers, of all the medications you take. 7. Pain Medication from other Providers: You are not allowed to accept any additional pain medication from any other Doctor or Healthcare provider. There are two exceptions to this rule. (see below) In the event that you require additional pain medication, you are responsible for notifying us, as stated below. 8. Medication Agreement: You are responsible for carefully reading and following our Medication Agreement. This must be signed before receiving any prescriptions from our practice. Safely store a copy of your signed Agreement. Violations to the Agreement will result in no further prescriptions. (Additional copies of our Medication Agreement are available upon request.) 9. Laws, Rules, & Regulations: All patients are expected to follow all Federal and State Laws, Statutes, Rules, & Regulations. Ignorance of the Laws does not constitute a valid excuse. The use of any illegal substances is prohibited. 10. Adopted CDC guidelines & recommendations: Target dosing levels will be  at or below 60 MME/day. Use of benzodiazepines** is not recommended.  Exceptions: There are only two exceptions to the rule of not   receiving pain medications from other Healthcare Providers. 1. Exception #1 (Emergencies): In the event of an emergency (i.e.: accident requiring emergency care), you are allowed to receive additional pain medication. However, you are responsible for: As soon as you are able, call our office [phone], at any time of the day or night, and leave a message stating your name, the date and nature of the emergency, and the name and dose of the medication prescribed. In the event that your call is answered by a member of our staff, make sure to document and save the date, time, and the name of the person that took your information.  2. Exception #2 (Planned Surgery): In the event that you are scheduled by another doctor or dentist to have any type of surgery or procedure, you are allowed (for a period no longer than 30 days), to receive additional pain medication, for the acute post-op pain. However, in this case, you are responsible for picking up a copy of our "Post-op Pain Management for Surgeons" handout, and giving it to your surgeon or dentist. This document is available at our office, and does not require an appointment to obtain it. Simply go to our office during business hours (Monday-Thursday from 8:00 AM to 4:00 PM) (Friday 8:00 AM to 12:00 Noon) or if you have a scheduled appointment with us, prior to your surgery, and ask for it by name. In addition, you will need to provide us with your name, name of your surgeon, type of surgery, and date of procedure or surgery.  *Opioid medications include: morphine, codeine, oxycodone, oxymorphone, hydrocodone, hydromorphone, meperidine, tramadol, tapentadol, buprenorphine, fentanyl, methadone. **Benzodiazepine medications include: diazepam (Valium), alprazolam (Xanax), clonazepam (Klonopine), lorazepam (Ativan), clorazepate  (Tranxene), chlordiazepoxide (Librium), estazolam (Prosom), oxazepam (Serax), temazepam (Restoril), triazolam (Halcion) (Last updated: 05/20/2017) ____________________________________________________________________________________________    

## 2018-05-19 NOTE — Progress Notes (Signed)
Nursing Pain Medication Assessment:  Safety precautions to be maintained throughout the outpatient stay will include: orient to surroundings, keep bed in low position, maintain call bell within reach at all times, provide assistance with transfer out of bed and ambulation.  Medication Inspection Compliance: Pill count conducted under aseptic conditions, in front of the patient. Neither the pills nor the bottle was removed from the patient's sight at any time. Once count was completed pills were immediately returned to the patient in their original bottle.  Medication: Hydrocodone/APAP Pill/Patch Count: 67 of 90 pills remain Pill/Patch Appearance: Markings consistent with prescribed medication Bottle Appearance: Standard pharmacy container. Clearly labeled. Filled Date: 2 / 17 / 2020 Last Medication intake:  Today

## 2018-05-19 NOTE — Telephone Encounter (Signed)
I spoke with [facility] pharmacist mentioned pt needed to fail or try at least 1 recommended formulary. She mentioned that pt has not tried or failed and there isn't a medication that she could fail or try in the same formulary category. She has overrided the request and mentioned coverage will start today. We will received fax with approval start and end date via fax.  PBA:[id number]

## 2018-05-20 NOTE — Telephone Encounter (Signed)
Approved 05/19/2018-03/23/2019.

## 2018-05-23 ENCOUNTER — Telehealth: Payer: Self-pay

## 2018-05-23 ENCOUNTER — Telehealth: Payer: Self-pay | Admitting: Family Medicine

## 2018-05-23 NOTE — Telephone Encounter (Signed)
Please review for refill. Thanks!  

## 2018-05-23 NOTE — Telephone Encounter (Signed)
Approval for Toviaz 8 mg  [id number]*RHZ Approved through 03/23/2019

## 2018-05-23 NOTE — Telephone Encounter (Signed)
*  STAT* If patient is at the pharmacy, call can be transferred to refill team.   1. Which medications need to be refilled? (please list name of each medication and dose if known) Pradaxa  2. Which pharmacy/location (including street and city if local pharmacy) is medication to be sent to? [facility]   3. Do they need a 30 day or 90 day supply? 90 day, pt is completely out

## 2018-05-24 ENCOUNTER — Other Ambulatory Visit: Payer: Self-pay

## 2018-05-24 DIAGNOSIS — I4891 Unspecified atrial fibrillation: Secondary | ICD-10-CM

## 2018-05-24 MED ORDER — DABIGATRAN ETEXILATE MESYLATE 150 MG PO CAPS: 150.0000 mg | ORAL_CAPSULE | Freq: Two times a day (BID) | ORAL | 1 refills | Status: DC

## 2018-05-25 LAB — TOXASSURE SELECT 13 (MW), URINE

## 2018-05-26 ENCOUNTER — Other Ambulatory Visit: Payer: Self-pay | Admitting: Internal Medicine

## 2018-05-26 DIAGNOSIS — E119 Type 2 diabetes mellitus without complications: Secondary | ICD-10-CM

## 2018-05-26 MED ORDER — METFORMIN HCL 1000 MG PO TABS: 1000.0000 mg | ORAL_TABLET | Freq: Two times a day (BID) | ORAL | 3 refills | Status: DC

## 2018-05-30 ENCOUNTER — Encounter: Payer: Self-pay | Admitting: Family Medicine

## 2018-05-30 ENCOUNTER — Ambulatory Visit: Payer: PPO | Admitting: Family Medicine

## 2018-05-30 ENCOUNTER — Telehealth: Payer: Self-pay | Admitting: Urology

## 2018-05-30 VITALS — BP 132/78 | HR 59 | Ht 65.0 in | Wt 261.0 lb

## 2018-05-30 DIAGNOSIS — E1143 Type 2 diabetes mellitus with diabetic autonomic (poly)neuropathy: Secondary | ICD-10-CM | POA: Diagnosis not present

## 2018-05-30 DIAGNOSIS — R300 Dysuria: Secondary | ICD-10-CM

## 2018-05-30 DIAGNOSIS — L97521 Non-pressure chronic ulcer of other part of left foot limited to breakdown of skin: Secondary | ICD-10-CM | POA: Diagnosis not present

## 2018-05-30 DIAGNOSIS — E1159 Type 2 diabetes mellitus with other circulatory complications: Secondary | ICD-10-CM | POA: Diagnosis not present

## 2018-05-30 LAB — URINALYSIS, COMPLETE
Bilirubin, UA: NEGATIVE
GLUCOSE, UA: NEGATIVE
Leukocytes, UA: NEGATIVE
Nitrite, UA: NEGATIVE
Specific Gravity, UA: >1.03 — ABNORMAL HIGH (ref 1.005–1.030)
Urobilinogen, Ur: 0.2 mg/dL (ref 0.2–1.0)
pH, UA: 5 (ref 5.0–7.5)

## 2018-05-30 LAB — MICROSCOPIC EXAMINATION

## 2018-05-30 MED ORDER — NITROFURANTOIN MONOHYD MACRO 100 MG PO CAPS: 100.0000 mg | ORAL_CAPSULE | Freq: Two times a day (BID) | ORAL | 0 refills | Status: DC

## 2018-05-30 NOTE — Telephone Encounter (Signed)
Pt called office with UTI symptoms, including nausea, bladder spasms and painful urination.  Pt was added to nurse schedule today.

## 2018-05-30 NOTE — Progress Notes (Signed)
Patient presents today with complaints of dysuria and nausea and urinary frequency.  A urine was collected for UA, UCX. Patient states she has not had any Urological surgeries, she has had ABX in the last month for Bronchitis. Dr. Matilde Sprang reviewed the UA and Macrobid was sent to the pharmacy.

## 2018-06-02 LAB — CULTURE, URINE COMPREHENSIVE

## 2018-06-06 ENCOUNTER — Encounter: Payer: Self-pay | Admitting: Internal Medicine

## 2018-06-08 ENCOUNTER — Ambulatory Visit: Payer: PPO | Admitting: Internal Medicine

## 2018-06-13 ENCOUNTER — Ambulatory Visit: Payer: Self-pay | Admitting: Urology

## 2018-06-20 ENCOUNTER — Other Ambulatory Visit: Payer: Self-pay | Admitting: Nurse Practitioner

## 2018-06-20 ENCOUNTER — Other Ambulatory Visit: Payer: Self-pay | Admitting: Pain Medicine

## 2018-06-21 ENCOUNTER — Other Ambulatory Visit: Payer: Self-pay | Admitting: Internal Medicine

## 2018-06-21 DIAGNOSIS — E039 Hypothyroidism, unspecified: Secondary | ICD-10-CM

## 2018-06-21 MED ORDER — LEVOTHYROXINE SODIUM 175 MCG PO TABS: 175.0000 ug | ORAL_TABLET | Freq: Every day | ORAL | 3 refills | Status: DC

## 2018-06-23 ENCOUNTER — Other Ambulatory Visit: Payer: Self-pay | Admitting: Nurse Practitioner

## 2018-06-23 ENCOUNTER — Telehealth: Payer: Self-pay | Admitting: *Deleted

## 2018-06-23 MED ORDER — PREGABALIN 25 MG PO CAPS: 25.0000 mg | ORAL_CAPSULE | Freq: Three times a day (TID) | ORAL | 2 refills | Status: DC

## 2018-06-23 NOTE — Telephone Encounter (Signed)
Called patient to let her know that Lyrica has been sent in.  

## 2018-06-23 NOTE — Telephone Encounter (Signed)
Spoke with patient and she is stating that pharmacy did not give her the full amount of Lyrica when she picked up.  They only gave her 50.  Also, when questioned about the gabapentin, she states that she can no longer swallow those pills and has a gastro consult about this issue.  She also stated that she had turned the gabapentin back in.  Called to the pharmacy to verify this information. They stated that she did pick up the other partial fill of qty 60 to complete the partial fill of qty 30.  And they have no record of her having the gabapentin, that she may have brought it back and they backed it out of her insurance.  Patient states she needs her Lyrica prescribed.    I did tell patient that this was a lot of information and that we may have to move her appt time up with Crystal so that she can explain what is going on with her.  Told her we would get back with her.

## 2018-06-23 NOTE — Telephone Encounter (Signed)
Lyrica Rx sent in

## 2018-06-27 ENCOUNTER — Telehealth: Payer: Self-pay | Admitting: Pulmonary Disease

## 2018-06-27 NOTE — Telephone Encounter (Signed)
Lm to confirm 06/30/18 consult and give directions.

## 2018-06-29 ENCOUNTER — Other Ambulatory Visit: Payer: Self-pay

## 2018-06-29 ENCOUNTER — Institutional Professional Consult (permissible substitution): Payer: PPO | Admitting: Internal Medicine

## 2018-06-29 ENCOUNTER — Ambulatory Visit: Payer: PPO | Attending: Pain Medicine | Admitting: Pain Medicine

## 2018-06-29 DIAGNOSIS — M8949 Other hypertrophic osteoarthropathy, multiple sites: Secondary | ICD-10-CM

## 2018-06-29 DIAGNOSIS — M159 Polyosteoarthritis, unspecified: Secondary | ICD-10-CM

## 2018-06-29 DIAGNOSIS — E1142 Type 2 diabetes mellitus with diabetic polyneuropathy: Secondary | ICD-10-CM | POA: Diagnosis not present

## 2018-06-29 DIAGNOSIS — E1143 Type 2 diabetes mellitus with diabetic autonomic (poly)neuropathy: Secondary | ICD-10-CM | POA: Diagnosis not present

## 2018-06-29 DIAGNOSIS — G894 Chronic pain syndrome: Secondary | ICD-10-CM | POA: Diagnosis not present

## 2018-06-29 DIAGNOSIS — M150 Primary generalized (osteo)arthritis: Secondary | ICD-10-CM | POA: Diagnosis not present

## 2018-06-29 DIAGNOSIS — L97521 Non-pressure chronic ulcer of other part of left foot limited to breakdown of skin: Secondary | ICD-10-CM | POA: Diagnosis not present

## 2018-06-29 DIAGNOSIS — E119 Type 2 diabetes mellitus without complications: Secondary | ICD-10-CM | POA: Diagnosis not present

## 2018-06-29 DIAGNOSIS — E559 Vitamin D deficiency, unspecified: Secondary | ICD-10-CM | POA: Diagnosis not present

## 2018-06-29 NOTE — Telephone Encounter (Signed)
Pt has been rescheduled for 07/12/18. Nothing further is needed at this time.

## 2018-06-29 NOTE — Progress Notes (Signed)
Pain Management Encounter Note - Virtual Visit via Telephone Telehealth (real-time audio visits between healthcare provider and patient).  Patient's Phone No. & Preferred Pharmacy:  [phone] (home); [phone] (mobile); (Preferred) [phone]  [place], [place] - [address] [place] [address] Phone: [phone] Fax: [phone]   Pre-screening note:  Our staff contacted Ms. Cordoba and offered her an "in person", "face-to-face" appointment versus a telephone encounter. She indicated preferring the telephone encounter, at this time.  Reason for Virtual Visit: COVID-19*  Social distancing based on CDC and AMA recommendations.   I contacted [name] on 06/29/2018 at 3:24 PM by telephone and clearly identified myself as Gaspar Cola, MD. I verified that I was speaking with the correct person using two identifiers (Name and date of birth: January 02, 1951).  Advanced Informed Consent I sought verbal advanced consent from [name] for telemedicine interactions and virtual visit. I informed Ms. Bier of the security and privacy concerns, risks, and limitations associated with performing an evaluation and management service by telephone. I also informed Ms. Cragg of the availability of "in person" appointments and I informed her of the possibility of a patient responsible charge related to this service. Ms. Charlot expressed understanding and agreed to proceed.   Historic Elements   Ms. Liviya Santini is a 68 y.o. year old, female patient evaluated today after her last encounter by our practice on 06/23/2018. Ms. Bacus  has a past medical history of Abnormal antibody titer, Anxiety and depression, Arthritis, Asthma, Cystocele, Depression, Diabetes ([place]), DVT (deep venous thrombosis) ([place]), Dysrhythmia, Edema, GERD (gastroesophageal reflux disease), Heart murmur, History of IBS, History of methicillin resistant staphylococcus aureus (MRSA)  (2008), Hyperlipidemia, Hypertension, Hypothyroidism, Insomnia, Kidney stone, Neuropathy involving both lower extremities, Obesity, Class II, BMI 35-39.9, with comorbidity, Paroxysmal atrial fibrillation ([place]) (2007), Peripheral vascular disease ([place]), Sleep apnea, Urinary incontinence, and Venous stasis dermatitis of both lower extremities. She also  has a past surgical history that includes Ankle surgery (Right); Vaginal hysterectomy; Sleeve Gastroplasty; Lithotripsy; Kidney surgery (Right); transthoracic echocardiogram (08/03/2013); transesophageal echocardiogram (08/08/2013); Tonsillectomy; Total knee arthroplasty; Hiatal hernia repair; Cholecystectomy; I&D extremity (Left, 06/27/2015); Application if wound vac (Left, 06/27/2015); removal of left hematoma (Left); NM GATED MYOVIEW [facility]) (February 2017); Reverse shoulder arthroplasty (Right, 10/08/2015); Hernia repair; Ulnar nerve transposition (Right, 08/06/2016); arm surgery; Irrigation and debridement abscess (Left, 09/07/2016); Incision and drainage of wound (Left, 09/25/2016); Application if wound vac (Left, 09/25/2016); Colonoscopy; Upper gi endoscopy; Esophagogastroduodenoscopy (egd) with propofol (N/A, 01/11/2017); Colonoscopy with propofol (N/A, 01/11/2017); Colonoscopy with propofol (N/A, 02/22/2017); OTHER SURGICAL HISTORY; Total shoulder replacement (Right); Joint replacement (2013); Colonoscopy with propofol (N/A, 05/31/2017); and Lower Extremity Angiography (Left, 04/04/2018). Ms. Frankland has a current medication list which includes the following prescription(s): albuterol, albuterol, alprazolam, amlodipine, bupropion, calcium citrate-vitamin d, clobetasol cream, dabigatran, dexlansoprazole, diclofenac sodium, duloxetine, estradiol, fesoterodine, fluticasone-salmeterol, hydrochlorothiazide, hydrocodone-acetaminophen, hydrocodone-acetaminophen, hydrocodone-acetaminophen, levocetirizine, levothyroxine, losartan, metformin, metoprolol succinate, montelukast,  mupirocin ointment, nitrofurantoin (macrocrystal-monohydrate), nystatin cream, ondansetron, oxybutynin, pregabalin, tiotropium bromide monohydrate, and zinc oxide, and the following Facility-Administered Medications: ipratropium-albuterol. She  reports that she has never smoked. She has never used smokeless tobacco. She reports that she does not drink alcohol or use drugs. Ms. Pontiff is allergic to penicillins; aspirin; morphine and related; and terramycin [oxytetracycline].   HPI  I last saw her on 06/20/2018. She is being evaluated for medication management.  This patient seems to be doing well on her current medication regimen and she indicates that the switch  from the Neurontin to the Lyrica has done wonders in terms of improving her pain.  She has gone from taking Neurontin 800 mg 1 tablet 4 times a day to taking Lyrica 25 mg 1 tablet p.o. 3 times daily with better results.  We will continue to monitor this and on her next visit, if we need to, we will consider increasing the Lyrica further.  For now she seems to be doing okay on her hydrocodone and will stay the same.  Review of recent tests  DG Sacrum/Coccyx CLINICAL DATA:  Fall, landed on tailbone  EXAM: SACRUM AND COCCYX - 2+ VIEW  COMPARISON:  05/24/2017  FINDINGS: Technically suboptimal study. I see no definite sacral or coccygeal fracture. No visible focal bone lesion.  IMPRESSION: No visible acute bony abnormality.  Limited study by technique.  Electronically Signed   By: Rolm Baptise M.D.   On: 04/19/2018 22:00   Clinical Support on 05/30/2018  Component Date Value Ref Range Status  . Specific Gravity, UA 05/30/2018 >1.030* 1.005 - 1.030 Final  . pH, UA 05/30/2018 5.0  5.0 - 7.5 Final  . Color, UA 05/30/2018 Yellow  Yellow Final  . Appearance Ur 05/30/2018 Clear  Clear Final  . Leukocytes, UA 05/30/2018 Negative  Negative Final  . Protein, UA 05/30/2018 3+* Negative/Trace Final  . Glucose, UA 05/30/2018 Negative   Negative Final  . Ketones, UA 05/30/2018 Trace* Negative Final  . RBC, UA 05/30/2018 Trace* Negative Final  . Bilirubin, UA 05/30/2018 Negative  Negative Final  . Urobilinogen, Ur 05/30/2018 0.2  0.2 - 1.0 mg/dL Final  . Nitrite, UA 05/30/2018 Negative  Negative Final  . Microscopic Examination 05/30/2018 See below:   Final  . Urine Culture, Comprehensive 05/30/2018 Final report   Final  . Organism ID, Bacteria 05/30/2018 Comment   Final   Comment: Mixed urogenital flora 10,000-25,000 colony forming units per mL   . WBC, UA 05/30/2018 0-5  0 - 5 /hpf Final  . RBC, UA 05/30/2018 0-2  0 - 2 /hpf Final  . Epithelial Cells (non renal) 05/30/2018 0-10  0 - 10 /hpf Final  . Renal Epithel, UA 05/30/2018 0-10* None seen /hpf Final  . Casts 05/30/2018 Present  None seen /lpf Final  . Cast Type 05/30/2018 Hyaline casts  N/A Final  . Mucus, UA 05/30/2018 Present  Not Estab. Final  . Bacteria, UA 05/30/2018 Few  None seen/Few Final   Assessment  The primary encounter diagnosis was Chronic pain syndrome. Diagnoses of Diabetic peripheral neuropathy associated with type 2 diabetes mellitus ([place]) and Primary osteoarthritis involving multiple joints were also pertinent to this visit.  Plan of Care  I have discontinued Izora Gala Bender's gabapentin. I am also having her maintain her albuterol, albuterol, Fluticasone-Salmeterol, buPROPion, levocetirizine, zinc oxide, mupirocin ointment, nystatin cream, diclofenac sodium, estradiol, oxybutynin, Tiotropium Bromide Monohydrate, clobetasol cream, montelukast, Calcium Citrate-Vitamin D (CALCIUM + D PO), metoprolol succinate, hydrochlorothiazide, ALPRAZolam, amLODipine, losartan, fesoterodine, Dexlansoprazole, ondansetron, DULoxetine, HYDROcodone-acetaminophen, HYDROcodone-acetaminophen, HYDROcodone-acetaminophen, dabigatran, metFORMIN, nitrofurantoin (macrocrystal-monohydrate), levothyroxine, and pregabalin. We will continue to administer  ipratropium-albuterol.  Pharmacotherapy (Medications Ordered): No orders of the defined types were placed in this encounter.  Orders:  No orders of the defined types were placed in this encounter.  Follow-up plan:   Return for Med-Mgmt w/ NP.  We will continue to monitor how the patient is doing on her Lyrica and we may increase it later if we need to.   I discussed the assessment and treatment plan with the patient. The  patient was provided an opportunity to ask questions and all were answered. The patient agreed with the plan and demonstrated an understanding of the instructions.  Patient advised to call back or seek an in-person evaluation if the symptoms or condition worsens.  Total duration of non-face-to-face encounter: 11 minutes.  Note by: Gaspar Cola, MD Date: 06/29/2018; Time: 3:24 PM  Disclaimer:  * Given the special circumstances of the COVID-19 pandemic, the federal government has announced that [employer] for Civil Rights ([employer]) will exercise its enforcement discretion and will not impose penalties on physicians using telehealth in the event of noncompliance with regulatory requirements under the [place] and Accountability Act (HIPAA) in connection with the good faith provision of telehealth during the XBDZH-29 national public health emergency. ([place])

## 2018-06-30 ENCOUNTER — Institutional Professional Consult (permissible substitution): Payer: PPO | Admitting: Pulmonary Disease

## 2018-07-01 ENCOUNTER — Telehealth: Payer: Self-pay | Admitting: *Deleted

## 2018-07-01 NOTE — Telephone Encounter (Signed)
Spoke with patient.  She states Dr Dossie Arbour did not reorder her Cymbalta during her televisit on 06-29-2018.  Please discuss with Dr Dossie Arbour when he returns to the office on Monday.

## 2018-07-04 NOTE — Telephone Encounter (Signed)
Spoke with [name] to let her know that Cymbalta is a 90 day supply and should last until June.  She has an appt on 08/17/18. Patient verbalizes u/o information.

## 2018-07-05 ENCOUNTER — Other Ambulatory Visit: Payer: Self-pay | Admitting: Internal Medicine

## 2018-07-05 DIAGNOSIS — F419 Anxiety disorder, unspecified: Secondary | ICD-10-CM

## 2018-07-05 DIAGNOSIS — F32A Depression, unspecified: Secondary | ICD-10-CM

## 2018-07-05 DIAGNOSIS — F329 Major depressive disorder, single episode, unspecified: Secondary | ICD-10-CM

## 2018-07-05 MED ORDER — ALPRAZOLAM 0.5 MG PO TABS: 0.5000 mg | ORAL_TABLET | Freq: Two times a day (BID) | ORAL | 2 refills | Status: DC | PRN

## 2018-07-06 DIAGNOSIS — E1143 Type 2 diabetes mellitus with diabetic autonomic (poly)neuropathy: Secondary | ICD-10-CM | POA: Diagnosis not present

## 2018-07-06 DIAGNOSIS — L97521 Non-pressure chronic ulcer of other part of left foot limited to breakdown of skin: Secondary | ICD-10-CM | POA: Diagnosis not present

## 2018-07-11 ENCOUNTER — Ambulatory Visit: Payer: PPO | Admitting: Urology

## 2018-07-11 DIAGNOSIS — E1143 Type 2 diabetes mellitus with diabetic autonomic (poly)neuropathy: Secondary | ICD-10-CM | POA: Diagnosis not present

## 2018-07-11 DIAGNOSIS — L97521 Non-pressure chronic ulcer of other part of left foot limited to breakdown of skin: Secondary | ICD-10-CM | POA: Diagnosis not present

## 2018-07-12 ENCOUNTER — Institutional Professional Consult (permissible substitution): Payer: PPO | Admitting: Pulmonary Disease

## 2018-07-13 ENCOUNTER — Other Ambulatory Visit: Payer: Self-pay

## 2018-07-13 ENCOUNTER — Telehealth (INDEPENDENT_AMBULATORY_CARE_PROVIDER_SITE_OTHER): Payer: PPO | Admitting: Urology

## 2018-07-13 ENCOUNTER — Telehealth: Payer: Self-pay | Admitting: Urology

## 2018-07-13 DIAGNOSIS — N3946 Mixed incontinence: Secondary | ICD-10-CM | POA: Diagnosis not present

## 2018-07-13 MED ORDER — FESOTERODINE FUMARATE ER 8 MG PO TB24: 8.0000 mg | ORAL_TABLET | Freq: Every day | ORAL | 3 refills | Status: DC

## 2018-07-13 NOTE — Telephone Encounter (Signed)
Would you call [name] and have her follow up in three months with Dr. Matilde Sprang?

## 2018-07-13 NOTE — Progress Notes (Signed)
Virtual Visit via Telephone Note  I connected with [name] on 07/13/2018 at [phone] by telephone and verified that I am speaking with the correct person using two identifiers.  They are located at home.   I am located at my home.    This visit type was conducted due to national recommendations for restrictions regarding the COVID-19 Pandemic (e.g. social distancing).  This format is felt to be most appropriate for this patient at this time.  All issues noted in this document were discussed and addressed.  No physical exam was performed.   I discussed the limitations, risks, security and privacy concerns of performing an evaluation and management service by telephone and the availability of in person appointments. I also discussed with the patient that there may be a patient responsible charge related to this service. The patient expressed understanding and agreed to proceed.   History of Present Illness: Mrs. Buelna is a 68 year old female with a history of mixed incontinence who is contacted today to reassess her response the the Toviaz 8 mg daily by Dr. Matilde Sprang on 05/16/2018.    She states that the first two to three weeks while on the Toviaz 8 mg daily, she noted no difference.  She states she has noted significant improvement in her incontinence over the last few weeks.  She is not having nocturia and she is able to hold her urine a bit longer in the morning.  Patient denies any gross hematuria, dysuria or suprapubic/flank pain.  Patient denies any fevers, chills, nausea or vomiting.   She is having some issues with a chronic cough due to her COPD and is seeing another pulmonologist for a second opinion.    She was recently on Macrobid.  She provided an UA and urine culture on 05/30/2018 for symptoms of dysuria, nausea and urinary frequency.  UA was positive for 3+ protein and 0-10 Renal epithelial cells.  Her urine culture was positive for MUF   Observations/Objective: Mrs. Canal was  groggy at first as I woke her up, but she became more clear headed as the time went on.  Her voice also sounded muffled and she stated that it was due to the dry mouth caused by the Toviaz and oxybutynin.  Assessment and Plan:  1. Mixed incontinence Patient would like to continue with the Toviaz 8 mg daily as well as the oxybutynin XL 15 mg at night  Her incontinence may improve further is she is able to get the chronic cough under better control Follow up with Dr. Matilde Sprang in three months    Follow Up Instructions:  Mrs. Schauer will follow up with Dr. Matilde Sprang in three months and will contact earlier if she should have worsening of her incontinence or symptoms of an UTI.    I discussed the assessment and treatment plan with the patient. The patient was provided an opportunity to ask questions and all were answered. The patient agreed with the plan and demonstrated an understanding of the instructions.   The patient was advised to call back or seek an in-person evaluation if the symptoms worsen or if the condition fails to improve as anticipated.  I provided 30 minutes of non-face-to-face time during this encounter.   Colinda Barth, PA-C

## 2018-07-15 ENCOUNTER — Other Ambulatory Visit: Payer: Self-pay | Admitting: Nurse Practitioner

## 2018-07-15 DIAGNOSIS — G894 Chronic pain syndrome: Secondary | ICD-10-CM

## 2018-07-18 ENCOUNTER — Other Ambulatory Visit: Payer: Self-pay

## 2018-07-18 ENCOUNTER — Ambulatory Visit (INDEPENDENT_AMBULATORY_CARE_PROVIDER_SITE_OTHER): Payer: PPO | Admitting: Pulmonary Disease

## 2018-07-18 ENCOUNTER — Encounter: Payer: Self-pay | Admitting: Pulmonary Disease

## 2018-07-18 DIAGNOSIS — Z8709 Personal history of other diseases of the respiratory system: Secondary | ICD-10-CM

## 2018-07-18 DIAGNOSIS — R0609 Other forms of dyspnea: Secondary | ICD-10-CM | POA: Diagnosis not present

## 2018-07-18 DIAGNOSIS — R0600 Dyspnea, unspecified: Secondary | ICD-10-CM

## 2018-07-18 DIAGNOSIS — I480 Paroxysmal atrial fibrillation: Secondary | ICD-10-CM | POA: Diagnosis not present

## 2018-07-18 DIAGNOSIS — E668 Other obesity: Secondary | ICD-10-CM

## 2018-07-18 DIAGNOSIS — J986 Disorders of diaphragm: Secondary | ICD-10-CM | POA: Diagnosis not present

## 2018-07-18 DIAGNOSIS — E1143 Type 2 diabetes mellitus with diabetic autonomic (poly)neuropathy: Secondary | ICD-10-CM | POA: Diagnosis not present

## 2018-07-18 DIAGNOSIS — R05 Cough: Secondary | ICD-10-CM

## 2018-07-18 DIAGNOSIS — L97521 Non-pressure chronic ulcer of other part of left foot limited to breakdown of skin: Secondary | ICD-10-CM | POA: Diagnosis not present

## 2018-07-18 DIAGNOSIS — R053 Chronic cough: Secondary | ICD-10-CM

## 2018-07-18 MED ORDER — FLUTICASONE-SALMETEROL 115-21 MCG/ACT IN AERO: 2.0000 | INHALATION_SPRAY | Freq: Every day | RESPIRATORY_TRACT | 12 refills | Status: DC

## 2018-07-18 NOTE — Patient Instructions (Signed)
Discontinue Spiriva (tiotropium) Continue montelukast (Singulair) and levocetrizine (Xyzal) Change Advair Diskus inhaler to Advair HFA (aerosol inhaler)   Prescription entered  2 puffs daily.  Rinse mouth thoroughly after use Continue albuterol (ProAir air) or nebulizer as needed for increased shortness of breath, wheezing, chest tightness, cough  Follow-up in 6 weeks with CXR and PFTs prior to that visit.  Call sooner if needed

## 2018-07-18 NOTE — Progress Notes (Signed)
PULMONARY CONSULT NOTE  Requesting MD/Service: [name] Date of initial consultation: 07/18/18 Reason for consultation: DOE, history of asthmatic bronchitis, chronic cough  PT PROFILE: 68 y.o. female never smoker referred for eval of above problems. Previously seen for same by Dr Ileene Musa and Dr Raul Del   DATA:  INTERVAL:  HPI:  As above. Her CC presently is chronic cough. She describes cough of varying frequency and intensity for several months but later acknowledges that it has been present for many years. She describes mucus as scant, thick, light green. She has been on approx 5 courses of antibiotics in past 6 or 7 months. She gets modest improvement with antibiotics. She is also on Advair, Spiriva, Singulair and Xyzal all prescribed for these same symptoms. She is a little vague about compliance with these meds and has difficulty clarifying which meds have been most beneficial. She reports repeated problems with thrush despite consistently rinsing mouth after use of Advair. When she uses nebulized albuterol, she feels that her cough and dyspnea are improved.   Symptoms are exacerbated by exposure to outdoors and only consistently relieved with nebulized albuterol. In the past, she has been prescribed prednisone which she recalls was beneficial.  She was treated for "bronchitis" frequently as a child and first given a diagnosis of asthma around age 60 or 74. She was previously employed in [employer] and presently has no occupational or environmental exposures. She has no travel exposures and has lived her entire life in [place] and [place]  Her cough is made worse in a supine position but she denies orthopnea. She has a diagnosis of OSA and uses CPAP compliantly. She denies PND and LE edema. Also denies CP, fever, hemoptysis, LE edema and calf tenderness.  She does note frequent dysphagia with food getting stuck in her chest. She has an appointment with gastroenterology 05/07 to evaluate this.    Past Medical History:  Diagnosis Date  . Abnormal antibody titer   . Anxiety and depression   . Arthritis   . Asthma   . Cystocele   . Depression   . Diabetes ([place])   . DVT (deep venous thrombosis) (HCC)    Righ calf  . Dysrhythmia   . Edema   . GERD (gastroesophageal reflux disease)   . Heart murmur    Echocardiogram June 2015: Mild MR (possible vegetation seen on TTE, not seen on TEE), normal LV size with moderate concentric LVH. Normal function EF 60-65%. Normal diastolic function. Mild LA dilation.  Marland Kitchen History of IBS   . History of methicillin resistant staphylococcus aureus (MRSA) 2008  . Hyperlipidemia   . Hypertension   . Hypothyroidism   . Insomnia   . Kidney stone    kidney stones with lithotripsy  . Neuropathy involving both lower extremities   . Obesity, Class II, BMI 35-39.9, with comorbidity   . Paroxysmal atrial fibrillation ([place]) 2007   In June 2015, Cardiac Event Monitor: Mostly SR/sinus arrhythmia with PVCs that are frequent. Short bursts of A. fib lasting several minutes;; CHA2DS2-VASc Score = 5 (age, Female, PVD, DM, HTN)  . Peripheral vascular disease ([place])   . Sleep apnea    use C-PAP  . Urinary incontinence   . Venous stasis dermatitis of both lower extremities     Past Surgical History:  Procedure Laterality Date  . ANKLE SURGERY Right   . APPLICATION OF WOUND VAC Left 06/27/2015   Procedure: APPLICATION OF WOUND VAC ( POSSIBLE ) ;  Surgeon: [name], MD;  Location: [facility] ORS;  Service: Vascular;  Laterality: Left;  . APPLICATION OF WOUND VAC Left 09/25/2016   Procedure: APPLICATION OF WOUND VAC;  Surgeon: [name], DPM;  Location: [facility];  Service: Podiatry;  Laterality: Left;  . arm surgery     right    . CHOLECYSTECTOMY    . COLONOSCOPY    . COLONOSCOPY WITH PROPOFOL N/A 01/11/2017   Procedure: COLONOSCOPY WITH PROPOFOL;  Surgeon: [name], MD;  Location: [facility] ENDOSCOPY;  Service: Endoscopy;  Laterality: N/A;  .  COLONOSCOPY WITH PROPOFOL N/A 02/22/2017   Procedure: COLONOSCOPY WITH PROPOFOL;  Surgeon: [name], MD;  Location: [facility] ENDOSCOPY;  Service: Endoscopy;  Laterality: N/A;  . COLONOSCOPY WITH PROPOFOL N/A 05/31/2017   Procedure: COLONOSCOPY WITH PROPOFOL;  Surgeon: [name], MD;  Location: [facility] ENDOSCOPY;  Service: Endoscopy;  Laterality: N/A;  . ESOPHAGOGASTRODUODENOSCOPY (EGD) WITH PROPOFOL N/A 01/11/2017   Procedure: ESOPHAGOGASTRODUODENOSCOPY (EGD) WITH PROPOFOL;  Surgeon: [name], MD;  Location: [facility] ENDOSCOPY;  Service: Endoscopy;  Laterality: N/A;  . HERNIA REPAIR     umbilical  . HIATAL HERNIA REPAIR    . I&D EXTREMITY Left 06/27/2015   Procedure: IRRIGATION AND DEBRIDEMENT EXTREMITY            ( CALF HEMATOMA ) POSSIBLE WOUND VAC;  Surgeon: [name], MD;  Location: [facility]S;  Service: Vascular;  Laterality: Left;  . INCISION AND DRAINAGE OF WOUND Left 09/25/2016   Procedure: IRRIGATION AND DEBRIDEMENT - PARTIAL RESECTION OF ACHILLES TENDON WITH WOUND VAC APPLICATION;  Surgeon: [name], DPM;  Location: [facility];  Service: Podiatry;  Laterality: Left;  . IRRIGATION AND DEBRIDEMENT ABSCESS Left 09/07/2016   Procedure: IRRIGATION AND DEBRIDEMENT ABSCESS LEFT HEEL;  Surgeon: [name], DPM;  Location: [facility];  Service: Podiatry;  Laterality: Left;  . JOINT REPLACEMENT  2013   left knee replacement  . KIDNEY SURGERY Right    kidney stones  . LITHOTRIPSY    . LOWER EXTREMITY ANGIOGRAPHY Left 04/04/2018   Procedure: LOWER EXTREMITY ANGIOGRAPHY;  Surgeon: [name], MD;  Location: [facility];  Service: Cardiovascular;  Laterality: Left;  . NM GATED MYOVIEW ([place] [facility])  February 2017   Likely breast attenuation. LOW RISK study. Normal EF 55-60%.  . OTHER SURGICAL HISTORY     08/2016 or 09/2016 surgery on achilles tenso h/o staph infection heal. Dr. [name]  . removal of left hematoma Left    leg  . REVERSE SHOULDER ARTHROPLASTY Right  10/08/2015   Procedure: REVERSE SHOULDER ARTHROPLASTY;  Surgeon: [name], MD;  Location: [facility];  Service: Orthopedics;  Laterality: Right;  . SLEEVE GASTROPLASTY    . TONSILLECTOMY    . TOTAL KNEE ARTHROPLASTY    . TOTAL SHOULDER REPLACEMENT Right   . TRANSESOPHAGEAL ECHOCARDIOGRAM  08/08/2013   Mild LVH, EF 60-65%. Moderate LA dilation and mild RA dilation. Mild MR with no evidence of stenosis and no evidence of endocarditis. A false chordae is noted.  . TRANSTHORACIC ECHOCARDIOGRAM  08/03/2013   Mild-Moderate concentric LVH, EF 60-65%. Normal diastolic function. Mild LA dilation. Mild MR with possible vegetation  - not confirmed on TEE   . ULNAR NERVE TRANSPOSITION Right 08/06/2016   Procedure: ULNAR NERVE DECOMPRESSION/TRANSPOSITION;  Surgeon: [name], MD;  Location: [facility] ORS;  Service: Orthopedics;  Laterality: Right;  . UPPER GI ENDOSCOPY    . VAGINAL HYSTERECTOMY      MEDICATIONS: I have reviewed all medications and confirmed  regimen as documented  Social History   Socioeconomic History  . Marital status: Married    Spouse name: Not on file  . Number of children: Not on file  . Years of education: Not on file  . Highest education level: Not on file  Occupational History  . Not on file  Social Needs  . Financial resource strain: Not on file  . Food insecurity:    Worry: Not on file    Inability: Not on file  . Transportation needs:    Medical: Not on file    Non-medical: Not on file  Tobacco Use  . Smoking status: Never Smoker  . Smokeless tobacco: Never Used  Substance and Sexual Activity  . Alcohol use: No    Alcohol/week: 0.0 standard drinks  . Drug use: No  . Sexual activity: Yes  Lifestyle  . Physical activity:    Days per week: Not on file    Minutes per session: Not on file  . Stress: Not on file  Relationships  . Social connections:    Talks on phone: Not on file    Gets together: Not on file    Attends religious service: Not on file     Active member of club or organization: Not on file    Attends meetings of clubs or organizations: Not on file    Relationship status: Not on file  . Intimate partner violence:    Fear of current or ex partner: Not on file    Emotionally abused: Not on file    Physically abused: Not on file    Forced sexual activity: Not on file  Other Topics Concern  . Not on file  Social History Narrative   She is currently married -- for 30+ years. Does not work. Does not smoke or take alcohol. She never smoked. She exercises at least 3 days a week since before her gastric surgery.   Marital status reviewed in history of present illness.   She has children and grandchildren     Family History  Problem Relation Age of Onset  . Skin cancer Father   . Diabetes Father   . Hypertension Father   . Peripheral vascular disease Father   . Cancer Father   . Cerebral aneurysm Father   . Alcohol abuse Father   . Varicose Veins Mother   . Kidney disease Mother   . Arthritis Mother   . Mental illness Sister   . Cancer Maternal Aunt        breast  . Breast cancer Maternal Aunt   . Arthritis Maternal Grandmother   . Hypertension Maternal Grandmother   . Diabetes Maternal Grandmother   . Arthritis Maternal Grandfather   . Heart disease Maternal Grandfather   . Hypertension Maternal Grandfather   . Arthritis Paternal Grandmother   . Hypertension Paternal Grandmother   . Diabetes Paternal Grandmother   . Arthritis Paternal Grandfather   . Hypertension Paternal Grandfather   . Bladder Cancer Neg Hx   . Kidney cancer Neg Hx     ROS: No fever, myalgias/arthralgias, unexplained weight loss or weight gain No new focal weakness or sensory deficits No otalgia, hearing loss, visual changes, nasal and sinus symptoms, mouth and throat problems No neck pain or adenopathy No abdominal pain, N/V/D, diarrhea, change in bowel pattern No dysuria, change in urinary pattern   Vitals:   07/18/18 1158 07/18/18  1207  BP:  132/82  Pulse:  62  SpO2:  95%  Weight:  258 lb (117 kg)   Height: 5' 5.5" (1.664 m)      EXAM:  Gen: Moderately obese, No overt respiratory distress, no coughing during encounter HEENT: NCAT, sclera white, oropharynx normal Neck: Supple without LAN, thyromegaly, JVD Lungs: breath sounds full, percussion normal, adventitious sounds: none Cardiovascular: RRR, no murmurs noted Abdomen: Soft, nontender, normal BS Ext: without clubbing, cyanosis, edema Neuro: CNs grossly intact, motor and sensory intact Skin: Limited exam, no lesions noted  DATA:   BMP Latest Ref Rng & Units 05/12/2018 04/04/2018 06/24/2017  Glucose 70 - 99 mg/dL 110(H) - 114(H)  BUN 6 - 23 mg/dL 24(H) 19 20  Creatinine 0.40 - 1.20 mg/dL 1.10 1.05(H) 0.82  BUN/Creat Ratio 11 - 26 - - -  Sodium 135 - 145 mEq/L 140 - 141  Potassium 3.5 - 5.1 mEq/L 5.0 - 4.5  Chloride 96 - 112 mEq/L 104 - 104  CO2 19 - 32 mEq/L 28 - 30  Calcium 8.4 - 10.5 mg/dL 9.3 - 9.1    CBC Latest Ref Rng & Units 05/12/2018 05/10/2017 09/30/2016  WBC 4.0 - 10.5 K/uL 6.3 5.6 5.8  Hemoglobin 12.0 - 15.0 g/dL 12.4 10.9(L) 11.8(L)  Hematocrit 36.0 - 46.0 % 37.9 33.9(L) 34.6(L)  Platelets 150.0 - 400.0 K/uL 276.0 271.0 261    CXR 04/19/17:  Chronically elevated R hemidiaphragm, NACPD  I have personally reviewed all chest radiographs reported above including CXRs and CT chest unless otherwise indicated  IMPRESSION:     ICD-10-CM   1. Chronic cough R05 Pulmonary Function Test [facility] Only    DG Chest 2 View  2. Documented history of asthma Z87.09   3. DOE (dyspnea on exertion) R06.09 Pulmonary Function Test [facility] Only    DG Chest 2 View  4. Chronically elevated R hemidiaphragm J98.6   5. Moderate obesity E66.8   6. PAF (paroxysmal atrial fibrillation) (HCC) I48.0    Cause of chronic cough is unclear. I doubt that it is due to uncontrolled asthma (a diagnosis that was apparently made on clinical grounds - I do not see confirmatory PFTs).  The report of dysphagia suggest possible esophageal disorder which could be contributing. GI eval is pending. It is also possible that the DPIs (which can irritate throat) are contributing  DOE is likely multifactorial with several potential contributing factors listed above  She is on numerous costly medications and, at this point, it is nearly impossible to discern whether any of them are helping or potentially contributing to her symptoms   PLAN:  Discontinue Spiriva (tiotropium) Continue montelukast (Singulair) and levocetrizine (Xyzal) Change Advair Diskus inhaler to Advair HFA (aerosol inhaler)   Prescription entered  2 puffs daily.  Rinse mouth thoroughly after use Continue albuterol (ProAir air) or nebulizer as needed for increased shortness of breath, wheezing, chest tightness, cough  Follow-up in 6 weeks with CXR and PFTs prior to that visit.  Call sooner if needed   Merton Border, MD PCCM service Mobile [phone] Pager [phone] 07/23/2018 3:17 PM

## 2018-07-23 ENCOUNTER — Encounter: Payer: Self-pay | Admitting: Pulmonary Disease

## 2018-07-26 ENCOUNTER — Telehealth: Payer: Self-pay

## 2018-07-26 ENCOUNTER — Other Ambulatory Visit: Payer: Self-pay

## 2018-07-26 ENCOUNTER — Other Ambulatory Visit: Payer: PPO

## 2018-07-26 DIAGNOSIS — E1143 Type 2 diabetes mellitus with diabetic autonomic (poly)neuropathy: Secondary | ICD-10-CM | POA: Diagnosis not present

## 2018-07-26 DIAGNOSIS — N390 Urinary tract infection, site not specified: Secondary | ICD-10-CM

## 2018-07-26 DIAGNOSIS — L97521 Non-pressure chronic ulcer of other part of left foot limited to breakdown of skin: Secondary | ICD-10-CM | POA: Diagnosis not present

## 2018-07-26 LAB — URINALYSIS, COMPLETE
Bilirubin, UA: NEGATIVE
Glucose, UA: NEGATIVE
Nitrite, UA: NEGATIVE
Specific Gravity, UA: >1.03 — ABNORMAL HIGH (ref 1.005–1.030)
Urobilinogen, Ur: 0.2 mg/dL (ref 0.2–1.0)
pH, UA: 5 (ref 5.0–7.5)

## 2018-07-26 LAB — MICROSCOPIC EXAMINATION: WBC, UA: >30 /hpf — AB (ref 0–5)

## 2018-07-26 NOTE — Telephone Encounter (Signed)
[name] has a left kidney stone as well.  She should come in for a KUB if she is having flank pain and she then can drop off an urine for UA and culture.  We can decide what to do after she has her KUB and we see what the urine looks like.  If she is having fevers/chills or nausea/vomiting, she should go to the ED.

## 2018-07-26 NOTE — Telephone Encounter (Signed)
Called patient again with no answer or vmail. Sent mychart message as well

## 2018-07-26 NOTE — Telephone Encounter (Signed)
Left patient mess to call, following up on after hours nurse report . Patient complaining of increased bladder spasms, urgency and flank/abd pain  Should patient bring in a UA today or call in abx?

## 2018-07-26 NOTE — Progress Notes (Signed)
Patient was notified Per [name] urine would be sent for culture will wait to treat , [name] will send a mychart message on Friday if results are back patient verbalized agreement with this plan

## 2018-07-26 NOTE — Telephone Encounter (Signed)
Patient states that she is not having any flank pain.  She is only having urinary incontinence and painful urination.  She will come to the office today for a UA and culture.

## 2018-07-28 ENCOUNTER — Inpatient Hospital Stay
Admission: EM | Admit: 2018-07-28 | Discharge: 2018-07-30 | DRG: 690 | Disposition: A | Discharge status: Home Health | Payer: PPO | Attending: Internal Medicine | Admitting: Internal Medicine

## 2018-07-28 ENCOUNTER — Encounter: Payer: Self-pay | Admitting: Emergency Medicine

## 2018-07-28 ENCOUNTER — Emergency Department: Payer: PPO

## 2018-07-28 ENCOUNTER — Telehealth: Payer: Self-pay | Admitting: Internal Medicine

## 2018-07-28 ENCOUNTER — Other Ambulatory Visit: Payer: Self-pay

## 2018-07-28 DIAGNOSIS — L97529 Non-pressure chronic ulcer of other part of left foot with unspecified severity: Secondary | ICD-10-CM | POA: Diagnosis present

## 2018-07-28 DIAGNOSIS — I440 Atrioventricular block, first degree: Secondary | ICD-10-CM | POA: Diagnosis present

## 2018-07-28 DIAGNOSIS — W1830XA Fall on same level, unspecified, initial encounter: Secondary | ICD-10-CM | POA: Diagnosis present

## 2018-07-28 DIAGNOSIS — Z7984 Long term (current) use of oral hypoglycemic drugs: Secondary | ICD-10-CM

## 2018-07-28 DIAGNOSIS — N390 Urinary tract infection, site not specified: Principal | ICD-10-CM | POA: Diagnosis present

## 2018-07-28 DIAGNOSIS — N3001 Acute cystitis with hematuria: Secondary | ICD-10-CM | POA: Diagnosis not present

## 2018-07-28 DIAGNOSIS — Z8249 Family history of ischemic heart disease and other diseases of the circulatory system: Secondary | ICD-10-CM

## 2018-07-28 DIAGNOSIS — E119 Type 2 diabetes mellitus without complications: Secondary | ICD-10-CM | POA: Diagnosis not present

## 2018-07-28 DIAGNOSIS — R296 Repeated falls: Secondary | ICD-10-CM | POA: Diagnosis present

## 2018-07-28 DIAGNOSIS — R5381 Other malaise: Secondary | ICD-10-CM | POA: Diagnosis not present

## 2018-07-28 DIAGNOSIS — E11621 Type 2 diabetes mellitus with foot ulcer: Secondary | ICD-10-CM | POA: Diagnosis present

## 2018-07-28 DIAGNOSIS — K589 Irritable bowel syndrome without diarrhea: Secondary | ICD-10-CM | POA: Diagnosis not present

## 2018-07-28 DIAGNOSIS — R7989 Other specified abnormal findings of blood chemistry: Secondary | ICD-10-CM | POA: Diagnosis not present

## 2018-07-28 DIAGNOSIS — Z886 Allergy status to analgesic agent status: Secondary | ICD-10-CM

## 2018-07-28 DIAGNOSIS — R001 Bradycardia, unspecified: Secondary | ICD-10-CM | POA: Diagnosis present

## 2018-07-28 DIAGNOSIS — Z6841 Body Mass Index (BMI) 40.0 and over, adult: Secondary | ICD-10-CM

## 2018-07-28 DIAGNOSIS — Z881 Allergy status to other antibiotic agents status: Secondary | ICD-10-CM

## 2018-07-28 DIAGNOSIS — K219 Gastro-esophageal reflux disease without esophagitis: Secondary | ICD-10-CM | POA: Diagnosis present

## 2018-07-28 DIAGNOSIS — Z9049 Acquired absence of other specified parts of digestive tract: Secondary | ICD-10-CM

## 2018-07-28 DIAGNOSIS — B9620 Unspecified Escherichia coli [E. coli] as the cause of diseases classified elsewhere: Secondary | ICD-10-CM | POA: Diagnosis present

## 2018-07-28 DIAGNOSIS — I10 Essential (primary) hypertension: Secondary | ICD-10-CM | POA: Diagnosis not present

## 2018-07-28 DIAGNOSIS — Z8614 Personal history of Methicillin resistant Staphylococcus aureus infection: Secondary | ICD-10-CM

## 2018-07-28 DIAGNOSIS — N179 Acute kidney failure, unspecified: Secondary | ICD-10-CM | POA: Diagnosis not present

## 2018-07-28 DIAGNOSIS — E785 Hyperlipidemia, unspecified: Secondary | ICD-10-CM | POA: Diagnosis present

## 2018-07-28 DIAGNOSIS — Z86718 Personal history of other venous thrombosis and embolism: Secondary | ICD-10-CM

## 2018-07-28 DIAGNOSIS — F419 Anxiety disorder, unspecified: Secondary | ICD-10-CM | POA: Diagnosis not present

## 2018-07-28 DIAGNOSIS — I480 Paroxysmal atrial fibrillation: Secondary | ICD-10-CM | POA: Diagnosis not present

## 2018-07-28 DIAGNOSIS — F329 Major depressive disorder, single episode, unspecified: Secondary | ICD-10-CM | POA: Diagnosis not present

## 2018-07-28 DIAGNOSIS — E875 Hyperkalemia: Secondary | ICD-10-CM | POA: Diagnosis present

## 2018-07-28 DIAGNOSIS — J209 Acute bronchitis, unspecified: Secondary | ICD-10-CM | POA: Diagnosis not present

## 2018-07-28 DIAGNOSIS — E039 Hypothyroidism, unspecified: Secondary | ICD-10-CM | POA: Diagnosis present

## 2018-07-28 DIAGNOSIS — W19XXXA Unspecified fall, initial encounter: Secondary | ICD-10-CM | POA: Diagnosis not present

## 2018-07-28 DIAGNOSIS — J440 Chronic obstructive pulmonary disease with acute lower respiratory infection: Secondary | ICD-10-CM | POA: Diagnosis not present

## 2018-07-28 DIAGNOSIS — Z833 Family history of diabetes mellitus: Secondary | ICD-10-CM

## 2018-07-28 DIAGNOSIS — G4700 Insomnia, unspecified: Secondary | ICD-10-CM | POA: Diagnosis not present

## 2018-07-28 DIAGNOSIS — Z880 Allergy status to penicillin: Secondary | ICD-10-CM

## 2018-07-28 DIAGNOSIS — E1142 Type 2 diabetes mellitus with diabetic polyneuropathy: Secondary | ICD-10-CM | POA: Diagnosis not present

## 2018-07-28 DIAGNOSIS — E1151 Type 2 diabetes mellitus with diabetic peripheral angiopathy without gangrene: Secondary | ICD-10-CM | POA: Diagnosis present

## 2018-07-28 DIAGNOSIS — Z79890 Hormone replacement therapy (postmenopausal): Secondary | ICD-10-CM

## 2018-07-28 DIAGNOSIS — E860 Dehydration: Secondary | ICD-10-CM | POA: Diagnosis present

## 2018-07-28 DIAGNOSIS — I872 Venous insufficiency (chronic) (peripheral): Secondary | ICD-10-CM | POA: Diagnosis present

## 2018-07-28 DIAGNOSIS — L97429 Non-pressure chronic ulcer of left heel and midfoot with unspecified severity: Secondary | ICD-10-CM | POA: Diagnosis present

## 2018-07-28 DIAGNOSIS — Z7951 Long term (current) use of inhaled steroids: Secondary | ICD-10-CM

## 2018-07-28 DIAGNOSIS — Z1159 Encounter for screening for other viral diseases: Secondary | ICD-10-CM

## 2018-07-28 DIAGNOSIS — M6281 Muscle weakness (generalized): Secondary | ICD-10-CM | POA: Diagnosis not present

## 2018-07-28 DIAGNOSIS — R1310 Dysphagia, unspecified: Secondary | ICD-10-CM | POA: Diagnosis present

## 2018-07-28 DIAGNOSIS — Z90710 Acquired absence of both cervix and uterus: Secondary | ICD-10-CM

## 2018-07-28 DIAGNOSIS — R52 Pain, unspecified: Secondary | ICD-10-CM | POA: Diagnosis not present

## 2018-07-28 DIAGNOSIS — E278 Other specified disorders of adrenal gland: Secondary | ICD-10-CM | POA: Diagnosis present

## 2018-07-28 DIAGNOSIS — J986 Disorders of diaphragm: Secondary | ICD-10-CM | POA: Diagnosis not present

## 2018-07-28 DIAGNOSIS — G894 Chronic pain syndrome: Secondary | ICD-10-CM | POA: Diagnosis present

## 2018-07-28 DIAGNOSIS — Z79899 Other long term (current) drug therapy: Secondary | ICD-10-CM

## 2018-07-28 DIAGNOSIS — Z20828 Contact with and (suspected) exposure to other viral communicable diseases: Secondary | ICD-10-CM | POA: Diagnosis not present

## 2018-07-28 DIAGNOSIS — R531 Weakness: Secondary | ICD-10-CM | POA: Diagnosis not present

## 2018-07-28 DIAGNOSIS — Z885 Allergy status to narcotic agent status: Secondary | ICD-10-CM

## 2018-07-28 DIAGNOSIS — Z96611 Presence of right artificial shoulder joint: Secondary | ICD-10-CM | POA: Diagnosis present

## 2018-07-28 DIAGNOSIS — L97409 Non-pressure chronic ulcer of unspecified heel and midfoot with unspecified severity: Secondary | ICD-10-CM | POA: Diagnosis present

## 2018-07-28 DIAGNOSIS — Z96652 Presence of left artificial knee joint: Secondary | ICD-10-CM | POA: Diagnosis present

## 2018-07-28 DIAGNOSIS — J4541 Moderate persistent asthma with (acute) exacerbation: Secondary | ICD-10-CM

## 2018-07-28 DIAGNOSIS — J9811 Atelectasis: Secondary | ICD-10-CM | POA: Diagnosis not present

## 2018-07-28 DIAGNOSIS — G4733 Obstructive sleep apnea (adult) (pediatric): Secondary | ICD-10-CM | POA: Diagnosis present

## 2018-07-28 DIAGNOSIS — Z7902 Long term (current) use of antithrombotics/antiplatelets: Secondary | ICD-10-CM

## 2018-07-28 DIAGNOSIS — Z903 Acquired absence of stomach [part of]: Secondary | ICD-10-CM

## 2018-07-28 DIAGNOSIS — L0390 Cellulitis, unspecified: Secondary | ICD-10-CM | POA: Diagnosis not present

## 2018-07-28 LAB — COMPREHENSIVE METABOLIC PANEL
ALT: 13 U/L (ref 0–44)
AST: 19 U/L (ref 15–41)
Albumin: 3.7 g/dL (ref 3.5–5.0)
Alkaline Phosphatase: 81 U/L (ref 38–126)
Anion gap: 10 (ref 5–15)
BUN: 28 mg/dL — ABNORMAL HIGH (ref 8–23)
CO2: 23 mmol/L (ref 22–32)
Calcium: 8.6 mg/dL — ABNORMAL LOW (ref 8.9–10.3)
Chloride: 103 mmol/L (ref 98–111)
Creatinine, Ser: 1.43 mg/dL — ABNORMAL HIGH (ref 0.44–1.00)
GFR calc Af Amer: 44 mL/min — ABNORMAL LOW (ref >60)
GFR calc non Af Amer: 38 mL/min — ABNORMAL LOW (ref >60)
Glucose, Bld: 106 mg/dL — ABNORMAL HIGH (ref 70–99)
Potassium: 6.1 mmol/L — ABNORMAL HIGH (ref 3.5–5.1)
Sodium: 136 mmol/L (ref 135–145)
Total Bilirubin: 0.6 mg/dL (ref 0.3–1.2)
Total Protein: 6.4 g/dL — ABNORMAL LOW (ref 6.5–8.1)

## 2018-07-28 LAB — CBC WITH DIFFERENTIAL/PLATELET
Abs Immature Granulocytes: 0.01 10*3/uL (ref 0.00–0.07)
Basophils Absolute: 0.1 10*3/uL (ref 0.0–0.1)
Basophils Relative: 1 %
Eosinophils Absolute: 0.4 10*3/uL (ref 0.0–0.5)
Eosinophils Relative: 5 %
HCT: 37.7 % (ref 36.0–46.0)
Hemoglobin: 12.1 g/dL (ref 12.0–15.0)
Immature Granulocytes: 0 %
Lymphocytes Relative: 11 %
Lymphs Abs: 0.9 10*3/uL (ref 0.7–4.0)
MCH: 28.9 pg (ref 26.0–34.0)
MCHC: 32.1 g/dL (ref 30.0–36.0)
MCV: 90.2 fL (ref 80.0–100.0)
Monocytes Absolute: 0.5 10*3/uL (ref 0.1–1.0)
Monocytes Relative: 7 %
Neutro Abs: 5.8 10*3/uL (ref 1.7–7.7)
Neutrophils Relative %: 76 %
Platelets: 234 10*3/uL (ref 150–400)
RBC: 4.18 MIL/uL (ref 3.87–5.11)
RDW: 13.8 % (ref 11.5–15.5)
WBC: 7.6 10*3/uL (ref 4.0–10.5)
nRBC: 0 % (ref 0.0–0.2)

## 2018-07-28 LAB — URINALYSIS, COMPLETE (UACMP) WITH MICROSCOPIC
Bilirubin Urine: NEGATIVE
Glucose, UA: NEGATIVE mg/dL
Ketones, ur: NEGATIVE mg/dL
Nitrite: NEGATIVE
Protein, ur: 100 mg/dL — AB
Specific Gravity, Urine: 1.021 (ref 1.005–1.030)
WBC, UA: >50 WBC/hpf — ABNORMAL HIGH (ref 0–5)
pH: 5 (ref 5.0–8.0)

## 2018-07-28 LAB — PROTIME-INR
INR: 1.6 — ABNORMAL HIGH (ref 0.8–1.2)
Prothrombin Time: 18.8 seconds — ABNORMAL HIGH (ref 11.4–15.2)

## 2018-07-28 LAB — TROPONIN I: Troponin I: <0.03 ng/mL (ref <0.03)

## 2018-07-28 LAB — FIBRIN DERIVATIVES D-DIMER (ARMC ONLY): Fibrin derivatives D-dimer (ARMC): 821.16 ng/mL (FEU) — ABNORMAL HIGH (ref 0.00–499.00)

## 2018-07-28 LAB — SARS CORONAVIRUS 2 BY RT PCR (HOSPITAL ORDER, PERFORMED IN ~~LOC~~ HOSPITAL LAB): SARS Coronavirus 2: NEGATIVE

## 2018-07-28 LAB — LACTIC ACID, PLASMA: Lactic Acid, Venous: 1.7 mmol/L (ref 0.5–1.9)

## 2018-07-28 LAB — LIPASE, BLOOD: Lipase: 27 U/L (ref 11–51)

## 2018-07-28 MED ORDER — SODIUM CHLORIDE 0.9 % IV BOLUS
2000.0000 mL | Freq: Once | INTRAVENOUS | Status: AC
  Administered 2018-07-28: 1000 mL via INTRAVENOUS

## 2018-07-28 MED ORDER — INSULIN ASPART 100 UNIT/ML ~~LOC~~ SOLN
10.0000 [IU] | Freq: Once | SUBCUTANEOUS | Status: AC
  Administered 2018-07-28: 10 [IU] via INTRAVENOUS
  Filled 2018-07-28: qty 1

## 2018-07-28 MED ORDER — SODIUM CHLORIDE 0.9% FLUSH
10.0000 mL | Freq: Two times a day (BID) | INTRAVENOUS | Status: DC
  Administered 2018-07-28: 40 mL
  Administered 2018-07-29 – 2018-07-30 (×3): 10 mL

## 2018-07-28 MED ORDER — SODIUM CHLORIDE 0.9 % IV BOLUS
1000.0000 mL | Freq: Once | INTRAVENOUS | Status: AC
  Administered 2018-07-28: 1000 mL via INTRAVENOUS

## 2018-07-28 MED ORDER — DEXTROSE 50 % IV SOLN
1.0000 | Freq: Once | INTRAVENOUS | Status: AC
  Administered 2018-07-28: 50 mL via INTRAVENOUS
  Filled 2018-07-28: qty 50

## 2018-07-28 MED ORDER — CALCIUM GLUCONATE-NACL 1-0.675 GM/50ML-% IV SOLN
1.0000 g | Freq: Once | INTRAVENOUS | Status: AC
  Administered 2018-07-28: 1000 mg via INTRAVENOUS
  Filled 2018-07-28: qty 50

## 2018-07-28 MED ORDER — SODIUM CHLORIDE 0.9 % IV SOLN: 1.0000 g | Freq: Once | INTRAVENOUS | Status: DC

## 2018-07-28 MED ORDER — SODIUM CHLORIDE 0.9% FLUSH: 10.0000 mL | INTRAVENOUS | Status: DC | PRN

## 2018-07-28 MED ORDER — IOHEXOL 350 MG/ML SOLN
60.0000 mL | Freq: Once | INTRAVENOUS | Status: AC | PRN
  Administered 2018-07-28: 60 mL via INTRAVENOUS
  Filled 2018-07-28: qty 60

## 2018-07-28 MED ORDER — SODIUM BICARBONATE 8.4 % IV SOLN
50.0000 meq | Freq: Once | INTRAVENOUS | Status: AC
  Administered 2018-07-28: 50 meq via INTRAVENOUS
  Filled 2018-07-28: qty 50

## 2018-07-28 MED ORDER — CIPROFLOXACIN IN D5W 400 MG/200ML IV SOLN
400.0000 mg | Freq: Once | INTRAVENOUS | Status: AC
  Administered 2018-07-28: 400 mg via INTRAVENOUS
  Filled 2018-07-28: qty 200

## 2018-07-28 NOTE — ED Notes (Signed)
EKG completed

## 2018-07-28 NOTE — ED Notes (Addendum)
ED TO INPATIENT HANDOFF REPORT  ED Nurse Name and Phone #: Metta Clines [phone]  S Name/Age/Gender [name] 68 y.o. female Room/Bed: ED53A/ED53A  Code Status   Code Status: Prior  Home/SNF/Other Home Patient oriented to: self, place, time and situation Is this baseline? Yes   Triage Complete: Triage complete  Chief Complaint ems lobby/fall  Triage Note Pt in via EMS from home with c/o a mechanical fall and now with pain to bilateral lower extremities. VS stable, Hx of COPD, HTN, Diabetes. Pt A&O, no LOC, no injury to head but EMS repots some dizziness with standing. Recent medication for HTN lowered. Pt had procedure on left achilles tendon 2 years ago and has limited weight bearing. Small abrasion noted to left ankle, abrasion wrapped.    Allergies Allergies  Allergen Reactions  . Penicillins Hives, Shortness Of Breath, Swelling and Other (See Comments)    Facial swelling Has patient had a PCN reaction causing immediate rash, facial/tongue/throat swelling, SOB or lightheadedness with hypotension: Yes Has patient had a PCN reaction causing severe rash involving mucus membranes or skin necrosis: Yes Has patient had a PCN reaction that required hospitalization No Has patient had a PCN reaction occurring within the last 10 years: No If all of the above answers are "NO", then may proceed with Cephalosporin use.   . Aspirin Hives  . Morphine And Related Other (See Comments)    Patient becomes very confused  . Terramycin [Oxytetracycline] Hives    Level of Care/Admitting Diagnosis ED Disposition    ED Disposition Condition Comment   Admit  The patient appears reasonably stabilized for admission considering the current resources, flow, and capabilities available in the ED at this time, and I doubt any other [facility] requiring further screening and/or treatment in the ED prior to admission is  present.       B Medical/Surgery History Past Medical History:  Diagnosis Date  .  Abnormal antibody titer   . Anxiety and depression   . Arthritis   . Asthma   . Cystocele   . Depression   . Diabetes ([place])   . DVT (deep venous thrombosis) (HCC)    Righ calf  . Dysrhythmia   . Edema   . GERD (gastroesophageal reflux disease)   . Heart murmur    Echocardiogram June 2015: Mild MR (possible vegetation seen on TTE, not seen on TEE), normal LV size with moderate concentric LVH. Normal function EF 60-65%. Normal diastolic function. Mild LA dilation.  Marland Kitchen History of IBS   . History of methicillin resistant staphylococcus aureus (MRSA) 2008  . Hyperlipidemia   . Hypertension   . Hypothyroidism   . Insomnia   . Kidney stone    kidney stones with lithotripsy  . Neuropathy involving both lower extremities   . Obesity, Class II, BMI 35-39.9, with comorbidity   . Paroxysmal atrial fibrillation ([place]) 2007   In June 2015, Cardiac Event Monitor: Mostly SR/sinus arrhythmia with PVCs that are frequent. Short bursts of A. fib lasting several minutes;; CHA2DS2-VASc Score = 5 (age, Female, PVD, DM, HTN)  . Peripheral vascular disease ([place])   . Sleep apnea    use C-PAP  . Urinary incontinence   . Venous stasis dermatitis of both lower extremities    Past Surgical History:  Procedure Laterality Date  . ANKLE SURGERY Right   . APPLICATION OF WOUND VAC Left 06/27/2015   Procedure: APPLICATION OF WOUND VAC ( POSSIBLE ) ;  Surgeon: [name], MD;  Location: [facility]  ORS;  Service: Vascular;  Laterality: Left;  . APPLICATION OF WOUND VAC Left 09/25/2016   Procedure: APPLICATION OF WOUND VAC;  Surgeon: [name], DPM;  Location: [facility] ORS;  Service: Podiatry;  Laterality: Left;  . arm surgery     right    . CHOLECYSTECTOMY    . COLONOSCOPY    . COLONOSCOPY WITH PROPOFOL N/A 01/11/2017   Procedure: COLONOSCOPY WITH PROPOFOL;  Surgeon: [name], MD;  Location: [facility] ENDOSCOPY;  Service: Endoscopy;  Laterality: N/A;  . COLONOSCOPY WITH PROPOFOL N/A 02/22/2017   Procedure:  COLONOSCOPY WITH PROPOFOL;  Surgeon: [name], MD;  Location: [facility] ENDOSCOPY;  Service: Endoscopy;  Laterality: N/A;  . COLONOSCOPY WITH PROPOFOL N/A 05/31/2017   Procedure: COLONOSCOPY WITH PROPOFOL;  Surgeon: [name], MD;  Location: [facility] ENDOSCOPY;  Service: Endoscopy;  Laterality: N/A;  . ESOPHAGOGASTRODUODENOSCOPY (EGD) WITH PROPOFOL N/A 01/11/2017   Procedure: ESOPHAGOGASTRODUODENOSCOPY (EGD) WITH PROPOFOL;  Surgeon: [name], MD;  Location: [facility] ENDOSCOPY;  Service: Endoscopy;  Laterality: N/A;  . HERNIA REPAIR     umbilical  . HIATAL HERNIA REPAIR    . I&D EXTREMITY Left 06/27/2015   Procedure: IRRIGATION AND DEBRIDEMENT EXTREMITY            ( CALF HEMATOMA ) POSSIBLE WOUND VAC;  Surgeon: [name], MD;  Location: [facility] ORS;  Service: Vascular;  Laterality: Left;  . INCISION AND DRAINAGE OF WOUND Left 09/25/2016   Procedure: IRRIGATION AND DEBRIDEMENT - PARTIAL RESECTION OF ACHILLES TENDON WITH WOUND VAC APPLICATION;  Surgeon: [name], DPM;  Location: [facility] ORS;  Service: Podiatry;  Laterality: Left;  . IRRIGATION AND DEBRIDEMENT ABSCESS Left 09/07/2016   Procedure: IRRIGATION AND DEBRIDEMENT ABSCESS LEFT HEEL;  Surgeon: [name], DPM;  Location: [facility] ORS;  Service: Podiatry;  Laterality: Left;  . JOINT REPLACEMENT  2013   left knee replacement  . KIDNEY SURGERY Right    kidney stones  . LITHOTRIPSY    . LOWER EXTREMITY ANGIOGRAPHY Left 04/04/2018   Procedure: LOWER EXTREMITY ANGIOGRAPHY;  Surgeon: [name], MD;  Location: [facility] CV LAB;  Service: Cardiovascular;  Laterality: Left;  . NM GATED MYOVIEW ([place] [facility])  February 2017   Likely breast attenuation. LOW RISK study. Normal EF 55-60%.  . OTHER SURGICAL HISTORY     08/2016 or 09/2016 surgery on achilles tenso h/o staph infection heal. Dr. [name]  . removal of left hematoma Left    leg  . REVERSE SHOULDER ARTHROPLASTY Right 10/08/2015   Procedure: REVERSE SHOULDER ARTHROPLASTY;   Surgeon: [name], MD;  Location: [facility] ORS;  Service: Orthopedics;  Laterality: Right;  . SLEEVE GASTROPLASTY    . TONSILLECTOMY    . TOTAL KNEE ARTHROPLASTY    . TOTAL SHOULDER REPLACEMENT Right   . TRANSESOPHAGEAL ECHOCARDIOGRAM  08/08/2013   Mild LVH, EF 60-65%. Moderate LA dilation and mild RA dilation. Mild MR with no evidence of stenosis and no evidence of endocarditis. A false chordae is noted.  . TRANSTHORACIC ECHOCARDIOGRAM  08/03/2013   Mild-Moderate concentric LVH, EF 60-65%. Normal diastolic function. Mild LA dilation. Mild MR with possible vegetation  - not confirmed on TEE   . ULNAR NERVE TRANSPOSITION Right 08/06/2016   Procedure: ULNAR NERVE DECOMPRESSION/TRANSPOSITION;  Surgeon: [name], MD;  Location: [facility] ORS;  Service: Orthopedics;  Laterality: Right;  . UPPER GI ENDOSCOPY    . VAGINAL HYSTERECTOMY       A IV Location/Drains/Wounds Patient Lines/Drains/Airways Status   Active  Line/Drains/Airways    Name:   Placement date:   Placement time:   Site:   Days:   Peripheral IV 07/28/18 Left Wrist   07/28/18    1600    Wrist   less than 1   Peripheral IV 07/28/18 Right Wrist   07/28/18    1708    Wrist   less than 1   Sheath 04/04/18 Right   04/04/18    0936    -   115   Midline Single Lumen 07/28/18 Midline Right Basilic 8 cm 0 cm   31/49/70    2637    Basilic   less than 1   Negative Pressure Wound Therapy Foot Left;Posterior   09/25/16    0842    -   671   Airway   02/22/17    1544     521   Incision (Closed) 09/07/16 Heel Left   09/07/16    1313     689   Incision (Closed) 09/25/16 Foot Left   09/25/16    0807     671          Intake/Output Last 24 hours No intake or output data in the 24 hours ending 07/28/18 2222  Labs/Imaging Results for orders placed or performed during the hospital encounter of 07/28/18 (from the past 48 hour(s))  Urinalysis, Complete w Microscopic     Status: Abnormal   Collection Time: 07/28/18  2:12 PM  Result Value Ref Range    Color, Urine AMBER (A) YELLOW    Comment: BIOCHEMICALS MAY BE AFFECTED BY COLOR   APPearance TURBID (A) CLEAR   Specific Gravity, Urine 1.021 1.005 - 1.030   pH 5.0 5.0 - 8.0   Glucose, UA NEGATIVE NEGATIVE mg/dL   Hgb urine dipstick MODERATE (A) NEGATIVE   Bilirubin Urine NEGATIVE NEGATIVE   Ketones, ur NEGATIVE NEGATIVE mg/dL   Protein, ur 100 (A) NEGATIVE mg/dL   Nitrite NEGATIVE NEGATIVE   Leukocytes,Ua MODERATE (A) NEGATIVE   RBC / HPF 11-20 0 - 5 RBC/hpf   WBC, UA >50 (H) 0 - 5 WBC/hpf   Bacteria, UA MANY (A) NONE SEEN   Squamous Epithelial / LPF 6-10 0 - 5   WBC Clumps PRESENT    Mucus PRESENT    Hyaline Casts, UA PRESENT     Comment: Performed at [facility], [address] [place], [place] [address]  Comprehensive metabolic panel     Status: Abnormal   Collection Time: 07/28/18  3:50 PM  Result Value Ref Range   Sodium 136 135 - 145 mmol/L   Potassium 6.1 (H) 3.5 - 5.1 mmol/L   Chloride 103 98 - 111 mmol/L   CO2 23 22 - 32 mmol/L   Glucose, Bld 106 (H) 70 - 99 mg/dL   BUN 28 (H) 8 - 23 mg/dL   Creatinine, Ser 1.43 (H) 0.44 - 1.00 mg/dL   Calcium 8.6 (L) 8.9 - 10.3 mg/dL   Total Protein 6.4 (L) 6.5 - 8.1 g/dL   Albumin 3.7 3.5 - 5.0 g/dL   AST 19 15 - 41 U/L   ALT 13 0 - 44 U/L   Alkaline Phosphatase 81 38 - 126 U/L   Total Bilirubin 0.6 0.3 - 1.2 mg/dL   GFR calc non Af Amer 38 (L) >60 mL/min   GFR calc Af Amer 44 (L) >60 mL/min   Anion gap 10 5 - 15    Comment: Performed at [facility], [address] [place], [place] [address]  Lipase, blood     Status: None   Collection Time: 07/28/18  3:50 PM  Result Value Ref Range   Lipase 27 11 - 51 U/L    Comment: Performed at [facility], [address] [place], [place] [address]  Troponin I - Once     Status: None   Collection Time: 07/28/18  3:50 PM  Result Value Ref Range   Troponin I <0.03 <0.03 ng/mL    Comment: Performed at [facility], [address]  [place], [place] [address]  Lactic acid, plasma     Status: None   Collection Time: 07/28/18  3:50 PM  Result Value Ref Range   Lactic Acid, Venous 1.7 0.5 - 1.9 mmol/L    Comment: Performed at [facility], [address] [place], [place] [address]  CBC with Differential     Status: None   Collection Time: 07/28/18  3:50 PM  Result Value Ref Range   WBC 7.6 4.0 - 10.5 K/uL   RBC 4.18 3.87 - 5.11 MIL/uL   Hemoglobin 12.1 12.0 - 15.0 g/dL   HCT 37.7 36.0 - 46.0 %   MCV 90.2 80.0 - 100.0 fL   MCH 28.9 26.0 - 34.0 pg   MCHC 32.1 30.0 - 36.0 g/dL   RDW 13.8 11.5 - 15.5 %   Platelets 234 150 - 400 K/uL   nRBC 0.0 0.0 - 0.2 %   Neutrophils Relative % 76 %   Neutro Abs 5.8 1.7 - 7.7 K/uL   Lymphocytes Relative 11 %   Lymphs Abs 0.9 0.7 - 4.0 K/uL   Monocytes Relative 7 %   Monocytes Absolute 0.5 0.1 - 1.0 K/uL   Eosinophils Relative 5 %   Eosinophils Absolute 0.4 0.0 - 0.5 K/uL   Basophils Relative 1 %   Basophils Absolute 0.1 0.0 - 0.1 K/uL   Immature Granulocytes 0 %   Abs Immature Granulocytes 0.01 0.00 - 0.07 K/uL    Comment: Performed at [facility], [address] [place], [place] [address]  Fibrin derivatives D-Dimer     Status: Abnormal   Collection Time: 07/28/18  3:50 PM  Result Value Ref Range   Fibrin derivatives D-dimer ([facility]) 821.16 (H) 0.00 - 499.00 ng/mL (FEU)    Comment: (NOTE) <> Exclusion of Venous Thromboembolism (VTE) - OUTPATIENT ONLY   (Emergency Department or [place])   0-499 ng/ml (FEU): With a low to intermediate pretest probability                      for VTE this test result excludes the diagnosis                      of VTE.   >499 ng/ml (FEU) : VTE not excluded; additional work up for VTE is                      required. <> Testing on Inpatients and Evaluation of Disseminated Intravascular   Coagulation (DIC) Reference Range:   0-499 ng/ml (FEU) Performed at [facility], [address] [place],  [address]    Protime-INR     Status: Abnormal   Collection Time: 07/28/18  3:50 PM  Result Value Ref Range   Prothrombin Time 18.8 (H) 11.4 - 15.2 seconds   INR 1.6 (H) 0.8 - 1.2    Comment: (NOTE) INR goal varies based on  device and disease states. Performed at [facility], [address] [place], [place] [address]   SARS Coronavirus 2 (CEPHEID - Performed in [facility] hospital lab), Hosp Order     Status: None   Collection Time: 07/28/18  3:50 PM  Result Value Ref Range   SARS Coronavirus 2 NEGATIVE NEGATIVE    Comment: (NOTE) If result is NEGATIVE SARS-CoV-2 target nucleic acids are NOT DETECTED. The SARS-CoV-2 RNA is generally detectable in upper and lower  respiratory specimens during the acute phase of infection. The lowest  concentration of SARS-CoV-2 viral copies this assay can detect is 250  copies / mL. A negative result does not preclude SARS-CoV-2 infection  and should not be used as the sole basis for treatment or other  patient management decisions.  A negative result may occur with  improper specimen collection / handling, submission of specimen other  than nasopharyngeal swab, presence of viral mutation(s) within the  areas targeted by this assay, and inadequate number of viral copies  (<250 copies / mL). A negative result must be combined with clinical  observations, patient history, and epidemiological information. If result is POSITIVE SARS-CoV-2 target nucleic acids are DETECTED. The SARS-CoV-2 RNA is generally detectable in upper and lower  respiratory specimens dur ing the acute phase of infection.  Positive  results are indicative of active infection with SARS-CoV-2.  Clinical  correlation with patient history and other diagnostic information is  necessary to determine patient infection status.  Positive results do  not rule out bacterial infection or co-infection with other viruses. If result is PRESUMPTIVE POSTIVE SARS-CoV-2 nucleic acids MAY BE  PRESENT.   A presumptive positive result was obtained on the submitted specimen  and confirmed on repeat testing.  While 2019 novel coronavirus  (SARS-CoV-2) nucleic acids may be present in the submitted sample  additional confirmatory testing may be necessary for epidemiological  and / or clinical management purposes  to differentiate between  SARS-CoV-2 and other Sarbecovirus currently known to infect humans.  If clinically indicated additional testing with an alternate test  methodology [phone]) is advised. The SARS-CoV-2 RNA is generally  detectable in upper and lower respiratory sp ecimens during the acute  phase of infection. The expected result is Negative. Fact Sheet for Patients:  StrictlyIdeas.no Fact Sheet for Healthcare Providers: BankingDealers.co.za This test is not yet approved or cleared by the [place] FDA and has been authorized for detection and/or diagnosis of SARS-CoV-2 by FDA under an Emergency Use Authorization (EUA).  This EUA will remain in effect (meaning this test can be used) for the duration of the COVID-19 declaration under Section 564(b)(1) of the Act, 21 U.S.C. section 360bbb-3(b)(1), unless the authorization is terminated or revoked sooner. Performed at [facility], [address] [place], [place] [address]    Dg Chest 1 View  Result Date: 07/28/2018 CLINICAL DATA:  Weakness, shortness of breath. EXAM: CHEST  1 VIEW COMPARISON:  Radiograph of April 11, 2017. FINDINGS: The heart size and mediastinal contours are within normal limits. No pneumothorax or pleural effusion is noted. Left lung is clear. Stable elevated right hemidiaphragm is noted with associated subsegmental atelectasis. Status post right shoulder arthroplasty. IMPRESSION: Stable elevated right hemidiaphragm with associated right basilar subsegmental atelectasis. Electronically Signed   By: Marijo Conception M.D.   On: 07/28/2018  17:38   Ct Head Wo Contrast  Result Date: 07/28/2018 CLINICAL DATA:  Muscle weakness, bilateral lower extremity pain EXAM: CT HEAD WITHOUT CONTRAST TECHNIQUE: Contiguous  axial images were obtained from the base of the skull through the vertex without intravenous contrast. COMPARISON:  12/24/2004 FINDINGS: Brain: No evidence of acute infarction, hemorrhage, extra-axial collection, ventriculomegaly, or mass effect. Generalized cerebral atrophy. Periventricular white matter low attenuation likely secondary to microangiopathy. Vascular: Cerebrovascular atherosclerotic calcifications are noted. Skull: Negative for fracture or focal lesion. Sinuses/Orbits: Visualized portions of the orbits are unremarkable. Visualized portions of the paranasal sinuses and mastoid air cells are unremarkable. Other: None. IMPRESSION: No acute intracranial pathology. Electronically Signed   By: Kathreen Devoid   On: 07/28/2018 20:47   Ct Angio Chest Pe W And/or Wo Contrast  Result Date: 07/28/2018 CLINICAL DATA:  Positive D-dimer, PE suspected EXAM: CT ANGIOGRAPHY CHEST WITH CONTRAST TECHNIQUE: Multidetector CT imaging of the chest was performed using the standard protocol during bolus administration of intravenous contrast. Multiplanar CT image reconstructions and MIPs were obtained to evaluate the vascular anatomy. CONTRAST:  81mL OMNIPAQUE IOHEXOL 350 MG/ML SOLN COMPARISON:  Chest x-ray today.  Chest CT 09/20/2016 FINDINGS: Cardiovascular: Heart is normal size. Aorta is normal caliber. Scattered coronary artery calcifications in the left anterior descending coronary artery. Scattered aortic calcifications. No filling defects in the pulmonary arteries to suggest pulmonary emboli. Mediastinum/Nodes: Thyroid unremarkable. No mediastinal, hilar, or axillary adenopathy. Trachea and esophagus are unremarkable. Lungs/Pleura: Tree-in-bud gland gas nodular densities in the anterior right upper lobe are stable since 2018, likely postinflammatory  scarring. No acute confluent airspace opacities or effusions. Upper Abdomen: Imaging into the upper abdomen shows no acute findings. Postoperative changes within the stomach, likely related to gastric sleeve. Musculoskeletal: Chest wall soft tissues are unremarkable. No acute bony abnormality. Review of the MIP images confirms the above findings. IMPRESSION: No evidence of pulmonary embolus. Punctate scattered left anterior descending coronary calcifications. Aortic Atherosclerosis (ICD10-I70.0). Electronically Signed   By: Rolm Baptise M.D.   On: 07/28/2018 20:47   Dg Foot Complete Left  Result Date: 07/28/2018 CLINICAL DATA:  Left foot ulcer. EXAM: LEFT FOOT - COMPLETE 3+ VIEW COMPARISON:  Radiographs of February 16, 2012. FINDINGS: There is no evidence of fracture or dislocation. Severe degenerative changes seen involving the first metatarsophalangeal joint. Soft tissues are unremarkable. IMPRESSION: Severe osteoarthritis of first metatarsophalangeal joint. No acute abnormality is noted. Electronically Signed   By: Marijo Conception M.D.   On: 07/28/2018 17:41    Pending Labs Unresulted Labs (From admission, onward)    Start     Ordered   07/28/18 1523  Culture, blood (routine x 2)  BLOOD CULTURE X 2,   STAT     07/28/18 1530   07/28/18 1521  Lactic acid, plasma  Now then every 2 hours,   STAT     07/28/18 1530          Vitals/Pain Today's Vitals   07/28/18 1358 07/28/18 2052  BP:  (!) 141/52  Pulse:  60  Resp:  16  Temp:  97.6 F (36.4 C)  TempSrc:  Oral  SpO2:  95%  Weight: 113.4 kg   Height: 5\' 2"  (1.575 m)   PainSc: 5      Isolation Precautions No active isolations  Medications Medications  sodium chloride flush (NS) 0.9 % injection 10-40 mL (40 mLs Intracatheter Given 07/28/18 2127)  sodium chloride flush (NS) 0.9 % injection 10-40 mL (has no administration in time range)  calcium gluconate 1 g/ 50 mL sodium chloride IVPB (1,000 mg Intravenous New Bag/Given 07/28/18 2127)   sodium chloride 0.9 % bolus 1,000 mL (0 mLs  Intravenous Stopped 07/28/18 1810)  insulin aspart (novoLOG) injection 10 Units (10 Units Intravenous Given 07/28/18 2044)  dextrose 50 % solution 50 mL (50 mLs Intravenous Given 07/28/18 2044)  sodium chloride 0.9 % bolus 2,000 mL (1,000 mLs Intravenous New Bag/Given 07/28/18 2109)  ciprofloxacin (CIPRO) IVPB 400 mg (400 mg Intravenous New Bag/Given 07/28/18 2106)  sodium bicarbonate injection 50 mEq (50 mEq Intravenous Given 07/28/18 2055)  iohexol (OMNIPAQUE) 350 MG/ML injection 60 mL (60 mLs Intravenous Contrast Given 07/28/18 2025)  sodium chloride 0.9 % bolus 1,000 mL (1,000 mLs Intravenous New Bag/Given 07/28/18 2113)    Mobility With device and person assist. Unsteady.  Moderate to high fall risk  Focused Assessments Fall, acute cystitis, and hyperkalemia 6.1. Skin tear to L ankle and R fa. Unsteady gait. A&Ox4. RA. Wheezing on auscultation.   R Recommendations: See Admitting Provider Note  Report given to:   Additional Notes:  R 22g wrist; L 20g wrist; R midline.

## 2018-07-28 NOTE — ED Notes (Addendum)
Pt A&Ox4. Pt has several different complaints. Bruising to arms. States has fallen x3 in last month. L ankle wrapped in bandage as well as R fa. Pt states these skin tears are from falls. Wheezing.

## 2018-07-28 NOTE — ED Notes (Signed)
Purewick inserted by this tech. By request of pt cup of ice water refilled.

## 2018-07-28 NOTE — Telephone Encounter (Signed)
Un able to leave message for patient to return call back. PEC may obtain information.

## 2018-07-28 NOTE — ED Notes (Signed)
IV team placing midline now.

## 2018-07-28 NOTE — ED Notes (Signed)
Myah NT to apply purewick.

## 2018-07-28 NOTE — ED Notes (Signed)
Pt assisted to/from bedpan again. Peri care provided. Bed linens changed. Pt repositioned in bed. Rails up. Bed in lowest position. Pt requesting food. PA states possibly after CT results back. Pt updated.

## 2018-07-28 NOTE — ED Notes (Signed)
Iv attempted at R ac w/o success.

## 2018-07-28 NOTE — Telephone Encounter (Signed)
Call her husband   [place]

## 2018-07-28 NOTE — ED Notes (Addendum)
Tried for 24g then 22gx2 without success. Will start to draw back blood then veins blow. All blood work collected minus 2nd blood cultures. Will have Vicente Males, RN try.

## 2018-07-28 NOTE — ED Notes (Addendum)
Pt denies any needs. Rails up. Bed in lowest position. Pt given food tray and another drink verbal okay from PA JC.

## 2018-07-28 NOTE — ED Notes (Signed)
Patient wheezing when this RN came into restroom to assist her.  Patient states she has felt very short of breath x 1 week.  Patient states she has been under a lot of family stress lately.  Patient also reports a recent change in her blood pressure, patient is unsure as to why.  Patient states she has fallen 3 times since Saturday.

## 2018-07-28 NOTE — ED Notes (Signed)
Pt used bedpan. Urinated.

## 2018-07-28 NOTE — ED Notes (Signed)
Pt given hospital phone as requested.

## 2018-07-28 NOTE — ED Provider Notes (Signed)
[facility] Emergency Department Provider Note  ____________________________________________  Time seen: Approximately 2:54 PM  I have reviewed the triage vital signs and the nursing notes.   HISTORY  Chief Complaint Fall and Leg Pain    HPI [name] is a 68 y.o. female who presents the emergency department complaining of multiple complaints.  Patient presents via EMS from home.  Patient is a rather wandering historian, however it appears that patient has been experiencing weakness, multiple falls at home, intermittent chest pain, difficulty swallowing, diffuse abdominal pain, dysuria, left lower extremity pain, worsening foot ulcer over the past days to weeks.  Patient reports that she is sustained approximately 3-4 falls in the last week where she became weak and collapsed.  She does not believe that she had a syncopal episode leading up to any of these collapses.  Patient is unsure whether she hit her head during any of the falls.  Patient reports that she has a history of A. fib, has had some chest pain and shortness of breath, especially exertional chest pain and shortness of breath.  She has had some diffuse abdominal pain with reported dysuria and polyuria.  Patient reports that she contacted urology and they were to perform a urinalysis and culture but not called her back with the results.  Patient is also reporting increased difficulty swallowing which she states has happened in the past but is been worse over the past week.  She is endorsing left thigh and calf pain over the past week as well as a worsening foot ulcer.  Patient sees podiatry for her foot ulcer and states that they are not pleased with her progress and have talked about surgical debridement.   Patient is unable to verbalize at this time with her most serious complaint is.  [name]: [phone] Husband. Medical Power of Attorney.  As patient had a significantly wandering history, I did  discuss the patient past medical history, history of present illness and plan of care with the husband with the patient's permission.       Past Medical History:  Diagnosis Date  . Abnormal antibody titer   . Anxiety and depression   . Arthritis   . Asthma   . Cystocele   . Depression   . Diabetes ([place])   . DVT (deep venous thrombosis) (HCC)    Righ calf  . Dysrhythmia   . Edema   . GERD (gastroesophageal reflux disease)   . Heart murmur    Echocardiogram June 2015: Mild MR (possible vegetation seen on TTE, not seen on TEE), normal LV size with moderate concentric LVH. Normal function EF 60-65%. Normal diastolic function. Mild LA dilation.  Marland Kitchen History of IBS   . History of methicillin resistant staphylococcus aureus (MRSA) 2008  . Hyperlipidemia   . Hypertension   . Hypothyroidism   . Insomnia   . Kidney stone    kidney stones with lithotripsy  . Neuropathy involving both lower extremities   . Obesity, Class II, BMI 35-39.9, with comorbidity   . Paroxysmal atrial fibrillation ([place]) 2007   In June 2015, Cardiac Event Monitor: Mostly SR/sinus arrhythmia with PVCs that are frequent. Short bursts of A. fib lasting several minutes;; CHA2DS2-VASc Score = 5 (age, Female, PVD, DM, HTN)  . Peripheral vascular disease ([place])   . Sleep apnea    use C-PAP  . Urinary incontinence   . Venous stasis dermatitis of both lower extremities     Patient Active Problem List  Diagnosis Date Noted  . Foot ulcer, left ([place]) 03/09/2018  . Adrenal mass ([place]) 08/02/2017  . Chronic atrial fibrillation 08/02/2017  . Eczema 06/27/2017  . Diabetic ulcer of left foot ([place]) 06/24/2017  . Mass of both adrenal glands ([place]) 05/16/2017  . Fatty liver 05/13/2017  . Strain of right hip 04/30/2017  . Ischial pain, right 04/30/2017  . Pain of left hip joint 04/22/2017  . Abnormality of gait and mobility 04/22/2017  . Muscle strain of left hip 04/09/2017  . Fall with injury 02/02/2017  . Anemia 11/18/2016   . Asthma 11/18/2016  . Carpal tunnel syndrome 11/18/2016  . Pain in joint involving ankle and foot 11/18/2016  . Obstructive sleep apnea of adult 11/18/2016  . Spinal stenosis of thoracic region 11/18/2016  . Abscess of tendon of left foot 09/21/2016  . Cubital tunnel syndrome on right 08/07/2016  . Chronic pain syndrome 02/25/2016  . Long term current use of opiate analgesic 02/25/2016  . Chronic right shoulder pain 02/25/2016  . Status post reverse total shoulder replacement 10/08/2015  . 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 Morbid obesity with BMI of 40.0-44.9, adult ([place]) 09/23/2015  . Chronic prescription benzodiazepine use 09/23/2015  . Osteoarthritis involving multiple joints 09/23/2015  . Other shoulder lesions (Right) 08/28/2015  . Cervical radiculitis 08/06/2015  . Opiate use (15 MME/Day) 01/16/2015  . Impingement syndrome of shoulder region 01/07/2015  . Mixed incontinence 11/26/2014  . Atrophic vaginitis 11/26/2014  . Anxiety and depression 09/21/2014  . Bladder cystocele 09/21/2014  . Gastro-esophageal reflux disease without esophagitis 09/21/2014  . DM (diabetes mellitus) type II, controlled, with peripheral vascular disorder ([place]) 08/31/2014  . Diabetic peripheral neuropathy associated with type 2 diabetes mellitus ([place]) 08/31/2014  . Asthma, moderate persistent, well-controlled 08/31/2014  . Hypothyroidism, adult 08/31/2014  . Peripheral vascular disease due to secondary diabetes mellitus ([place]) 12/11/2013  . Apnea, sleep 10/19/2013  . Paroxysmal atrial fibrillation ([place]); CHA2DS2-Vasc Score 5. On Pradaxa 07/27/2013  . Essential hypertension 07/27/2013    Past Surgical History:  Procedure Laterality Date  . ANKLE SURGERY Right   . APPLICATION OF WOUND VAC Left 06/27/2015   Procedure: APPLICATION OF WOUND VAC ( POSSIBLE ) ;  Surgeon: [name], MD;  Location: [facility] ORS;  Service: Vascular;  Laterality: Left;  . APPLICATION OF WOUND  VAC Left 09/25/2016   Procedure: APPLICATION OF WOUND VAC;  Surgeon: [name], DPM;  Location: [facility] ORS;  Service: Podiatry;  Laterality: Left;  . arm surgery     right    . CHOLECYSTECTOMY    . COLONOSCOPY    . COLONOSCOPY WITH PROPOFOL N/A 01/11/2017   Procedure: COLONOSCOPY WITH PROPOFOL;  Surgeon: [name], MD;  Location: [facility] ENDOSCOPY;  Service: Endoscopy;  Laterality: N/A;  . COLONOSCOPY WITH PROPOFOL N/A 02/22/2017   Procedure: COLONOSCOPY WITH PROPOFOL;  Surgeon: [name], MD;  Location: [facility] ENDOSCOPY;  Service: Endoscopy;  Laterality: N/A;  . COLONOSCOPY WITH PROPOFOL N/A 05/31/2017   Procedure: COLONOSCOPY WITH PROPOFOL;  Surgeon: [name], MD;  Location[facility] END[facility]COPY;  Service: Endoscopy;  Laterality: N/A;  . ESOPHAGOGASTRODUODENOSCOPY (EGD) WITH PROPOFOL N/A 01/11/2017   Procedure: ESOPHAGOGASTRODUODENOSCOPY (EGD) WITH PROPOFOL;  Surgeon: [name], MD;  Location: [facility] ENDOSCOPY;  Service: Endoscopy;  Laterality: N/A;  . HERNIA REPAIR     umbilical  . HIATAL HERNIA REPAIR    . I&D EXTREMITY Left 06/27/2015   Procedure: IRRIGATION AND DEBRIDEMENT EXTREMITY            (  CALF HEMATOMA ) POSSIBLE WOUND VAC;  Surgeon: [name], MD;  Location: [facility] ORS;  Service: Vascular;  Laterality: Left;  . INCISION AND DRAINAGE OF WOUND Left 09/25/2016   Procedure: IRRIGATION AND DEBRIDEMENT - PARTIAL RESECTION OF ACHILLES TENDON WITH WOUND VAC APPLICATION;  Surgeon: [name], DPM;  Location: [facility] ORS;  Service: Podiatry;  Laterality: Left;  . IRRIGATION AND DEBRIDEMENT ABSCESS Left 09/07/2016   Procedure: IRRIGATION AND DEBRIDEMENT ABSCESS LEFT HEEL;  Surgeon: [name], DPM;  Location: [facility] ORS;  Service: Podiatry;  Laterality: Left;  . JOINT REPLACEMENT  2013   left knee replacement  . KIDNEY SURGERY Right    kidney stones  . LITHOTRIPSY    . LOWER EXTREMITY ANGIOGRAPHY Left 04/04/2018   Procedure: LOWER EXTREMITY ANGIOGRAPHY;   Surgeon: [name], MD;  Location: [facility];  Service: Cardiovascular;  Laterality: Left;  . NM GATED MYOVIEW ([place] HX)  February 2017   Likely breast attenuation. LOW RISK study. Normal EF 55-60%.  . OTHER SURGICAL HISTORY     08/2016 or 09/2016 surgery on achilles tenso h/o staph infection heal. Dr. [name]  . removal of left hematoma Left    leg  . REVERSE SHOULDER ARTHROPLASTY Right 10/08/2015   Procedure: REVERSE SHOULDER ARTHROPLASTY;  Surgeon: [name], MD;  Location: [facility] ORS;  Service: Orthopedics;  Laterality: Right;  . SLEEVE GASTROPLASTY    . TONSILLECTOMY    . TOTAL KNEE ARTHROPLASTY    . TOTAL SHOULDER REPLACEMENT Right   . TRANSESOPHAGEAL ECHOCARDIOGRAM  08/08/2013   Mild LVH, EF 60-65%. Moderate LA dilation and mild RA dilation. Mild MR with no evidence of stenosis and no evidence of endocarditis. A false chordae is noted.  . TRANSTHORACIC ECHOCARDIOGRAM  08/03/2013   Mild-Moderate concentric LVH, EF 60-65%. Normal diastolic function. Mild LA dilation. Mild MR with possible vegetation  - not confirmed on TEE   . ULNAR NERVE TRANSPOSITION Right 08/06/2016   Procedure: ULNAR NERVE DECOMPRESSION/TRANSPOSITION;  Surgeon: [name], MD;  Location: [facility] ORS;  Service: Orthopedics;  Laterality: Right;  . UPPER GI ENDOSCOPY    . VAGINAL HYSTERECTOMY      Prior to Admission medications   Medication Sig Start Date End Date Taking? Authorizing Provider  albuterol (PROVENTIL HFA;VENTOLIN HFA) 108 (90 Base) MCG/ACT inhaler Inhale 1-2 puffs into the lungs every 4 (four) hours as needed for wheezing or shortness of breath. Patient taking differently: Inhale 2 puffs into the lungs every 4 (four) hours as needed for wheezing or shortness of breath.  03/29/15  Yes Bobetta Lime, MD  albuterol (PROVENTIL) (2.5 MG/3ML) 0.083% nebulizer solution Take 3 mLs (2.5 mg total) by nebulization every 6 (six) hours as needed for wheezing or shortness of breath. 02/21/15  Yes Ashok Norris, MD  ALPRAZolam Duanne Moron) 0.5 MG tablet Take 1 tablet (0.5 mg total) by mouth 2 (two) times daily as needed for anxiety. 07/05/18  Yes [name], [name], MD  amLODipine (NORVASC) 5 MG tablet Take 1 tablet (5 mg total) by mouth daily. 04/20/18  Yes [name], [name], MD  buPROPion (WELLBUTRIN XL) 300 MG 24 hr tablet Take 1 tablet (300 mg total) by mouth daily. 07/16/17  Yes [name], [name], MD  Calcium Citrate-Vitamin D (CALCIUM + D PO) Take 1 tablet by mouth 2 (two) times daily.   Yes [provider]  clobetasol cream (TEMOVATE) 7.74 % Apply 1 application topically 2 (two) times daily. To arms Patient taking differently: Apply 1 application topically  2 (two) times daily as needed (irritation). To arms 02/03/18  Yes [name], [name], MD  dabigatran (PRADAXA) 150 MG CAPS capsule Take 1 capsule (150 mg total) by mouth 2 (two) times daily. 05/24/18  Yes Wellington Hampshire, MD  Dexlansoprazole (DEXILANT) 30 MG capsule Take 1 capsule (30 mg total) by mouth daily. Note lower maintenance dose take 30 min before food lunch or dinner not with thyroid medication 05/17/18  Yes [name], [name], MD  diclofenac sodium (VOLTAREN) 1 % GEL Apply 1 application topically daily as needed (pain).    Yes [provider]  DULoxetine (CYMBALTA) 60 MG capsule Take 1 capsule (60 mg total) by mouth daily. 06/08/18 09/06/18 Yes Vevelyn Francois, NP  estradiol (ESTRACE) 0.1 MG/GM vaginal cream Apply 0.5mg  (pea-sized amount)  just inside the vaginal introitus with a finger-tip on Monday, Wednesday and Friday nights, 12/15/17  Yes McGowan, [name] A, PA-C  fesoterodine (TOVIAZ) 8 MG TB24 tablet Take 1 tablet (8 mg total) by mouth daily. 07/13/18  Yes McGowan, [name] A, PA-C  fluticasone-salmeterol (ADVAIR HFA) 115-21 MCG/ACT inhaler Inhale 2 puffs into the lungs daily. Rinse mouth thoroughly after use 07/18/18  Yes Wilhelmina Mcardle, MD  hydrochlorothiazide (HYDRODIURIL) 12.5 MG  tablet Take 1 tablet (12.5 mg total) by mouth daily. In am 04/06/18  Yes [name], [name], MD  HYDROcodone-acetaminophen (NORCO/VICODIN) 5-325 MG tablet Take 1 tablet by mouth every 8 (eight) hours as needed for up to 30 days for severe pain. 08/07/18 09/06/18 Yes Vevelyn Francois, NP  HYDROcodone-acetaminophen (NORCO/VICODIN) 5-325 MG tablet Take 1 tablet by mouth every 8 (eight) hours as needed for up to 30 days for severe pain. 07/08/18 08/07/18 Yes King, Diona Foley, NP  levocetirizine (XYZAL) 5 MG tablet TAKE 1 TABLET BY MOUTH EVERY EVENING 08/27/17  Yes [name], [name], MD  levothyroxine (SYNTHROID, LEVOTHROID) 175 MCG tablet Take 1 tablet (175 mcg total) by mouth daily before breakfast. 06/21/18  Yes [name], [name], MD  losartan (COZAAR) 50 MG tablet Take 1 tablet (50 mg total) by mouth daily. Do not take quinapril 40 05/12/18  Yes [name], [name], MD  metFORMIN (GLUCOPHAGE) 1000 MG tablet Take 1 tablet (1,000 mg total) by mouth 2 (two) times daily. With food 05/26/18  Yes [name], [name], MD  metoprolol succinate (TOPROL-XL) 50 MG 24 hr tablet Take 1 tablet (50 mg total) by mouth daily. Take with or immediately following a meal. 03/24/18 07/28/18 Yes Arida, Mertie Clause, MD  montelukast (SINGULAIR) 10 MG tablet Take 1 tablet (10 mg total) by mouth daily. 02/22/18  Yes [name], [name], MD  mupirocin ointment (BACTROBAN) 2 % Apply 1 application topically 2 (two) times daily. Skin wounds 10/04/17  Yes [name], [name], MD  nystatin cream (MYCOSTATIN) Apply 1 application topically 2 (two) times daily. Mouth prn for 7 days as needed Patient taking differently: Apply 1 application topically 2 (two) times daily as needed for dry skin. Mouth prn for 7 days as needed 11/17/17  Yes [name], [name], MD  ondansetron (ZOFRAN) 4 MG tablet Take 1 tablet (4 mg total) by mouth every 8 (eight) hours as needed for nausea or vomiting. 05/19/18  Yes [name],  [name], MD  oxybutynin (DITROPAN XL) 15 MG 24 hr tablet TAKE ONE TABLET BY MOUTH AT BEDTIME 12/30/17  Yes McGowan, Shannon A, PA-C  pregabalin (LYRICA) 25 MG capsule Take 1 capsule (25 mg total) by mouth 3 (three) times daily. 06/23/18 09/21/18 Yes Vevelyn Francois, NP  zinc oxide 20 % ointment Apply 1 application topically as needed for irritation. 09/02/17  Yes McGowan, [name] A, PA-C  nitrofurantoin, macrocrystal-monohydrate, (MACROBID) 100 MG capsule Take 1 capsule (100 mg total) by mouth every 12 (twelve) hours. Patient not taking: Reported on 07/28/2018 05/30/18   Bjorn Loser, MD    Allergies Penicillins; Aspirin; Morphine and related; and Terramycin [oxytetracycline]  Family History  Problem Relation Age of Onset  . Skin cancer Father   . Diabetes Father   . Hypertension Father   . Peripheral vascular disease Father   . Cancer Father   . Cerebral aneurysm Father   . Alcohol abuse Father   . Varicose Veins Mother   . Kidney disease Mother   . Arthritis Mother   . Mental illness Sister   . Cancer Maternal Aunt        breast  . Breast cancer Maternal Aunt   . Arthritis Maternal Grandmother   . Hypertension Maternal Grandmother   . Diabetes Maternal Grandmother   . Arthritis Maternal Grandfather   . Heart disease Maternal Grandfather   . Hypertension Maternal Grandfather   . Arthritis Paternal Grandmother   . Hypertension Paternal Grandmother   . Diabetes Paternal Grandmother   . Arthritis Paternal Grandfather   . Hypertension Paternal Grandfather   . Bladder Cancer Neg Hx   . Kidney cancer Neg Hx     Social History Social History   Tobacco Use  . Smoking status: Never Smoker  . Smokeless tobacco: Never Used  Substance Use Topics  . Alcohol use: No    Alcohol/week: 0.0 standard drinks  . Drug use: No     Review of Systems  Constitutional: No fever/chills.  Generalized weakness Eyes: No visual changes. No discharge ENT: Reported difficulty  swallowing Cardiovascular: Intermittent chest pain. Respiratory: no cough.  Exertional SOB. Gastrointestinal: Reported diffuse abdominal pain..  No nausea, no vomiting.  No diarrhea.  No constipation. Genitourinary: Positive for dysuria and polyuria. No hematuria Musculoskeletal: Worsening left foot ulcer Skin: Negative for rash, abrasions, lacerations, ecchymosis. Neurological: Negative for headaches, focal weakness or numbness. 10-point ROS otherwise negative.  ____________________________________________   PHYSICAL EXAM:  VITAL SIGNS: ED Triage Vitals [07/28/18 1358]  Enc Vitals Group     BP      Pulse      Resp      Temp      Temp src      SpO2      Weight 250 lb (113.4 kg)     Height 5\' 2"  (1.575 m)     Head Circumference      Peak Flow      Pain Score 5     Pain Loc      Pain Edu?      Excl. in [place]?      Constitutional: Alert and oriented. Well appearing and in no acute distress. Eyes: Conjunctivae are normal. PERRL. EOMI. Head: Atraumatic. ENT:      Ears: EACs and TMs unremarkable bilaterally.      Nose: No congestion/rhinnorhea.      Mouth/Throat: Mucous membranes are moist.  Neck: No stridor.  No cervical spine tenderness to palpation. Hematological/Lymphatic/Immunilogical: No cervical lymphadenopathy. Cardiovascular: Normal rate, regular rhythm. Normal S1 and S2.  Good peripheral circulation. Respiratory: Normal respiratory effort without tachypnea or retractions. Lungs CTAB. Good air entry to the bases with no decreased or absent breath sounds. Gastrointestinal: Bowel sounds 4 quadrants.  Soft to palpation all quadrants.  Mildly tender to palpation of  the suprapubic region, otherwise no palpable tenderness.  No guarding or rigidity. No palpable masses. No distention. No CVA tenderness Musculoskeletal: Full range of motion to all extremities. No gross deformities appreciated.  Patient has left lower extremity wrapped in bandaging around her foot ulcer.   Drainage is appreciated, however this is more serosanguineous.  Obvious foot ulcer are appreciated.  No gross surrounding erythema or edema appreciated. Neurologic:  Normal speech and language. No gross focal neurologic deficits are appreciated.  Skin:  Skin is warm, dry and intact. No rash noted. Psychiatric: Mood and affect are normal. Speech and behavior are normal. Patient exhibits appropriate insight and judgement.   ____________________________________________   LABS (all labs ordered are listed, but only abnormal results are displayed)  Labs Reviewed  COMPREHENSIVE METABOLIC PANEL - Abnormal; Notable for the following components:      Result Value   Potassium 6.1 (*)    Glucose, Bld 106 (*)    BUN 28 (*)    Creatinine, Ser 1.43 (*)    Calcium 8.6 (*)    Total Protein 6.4 (*)    GFR calc non Af Amer 38 (*)    GFR calc Af Amer 44 (*)    All other components within normal limits  FIBRIN DERIVATIVES D-DIMER ([facility] ONLY) - Abnormal; Notable for the following components:   Fibrin derivatives D-dimer ([facility]) 821.16 (*)    All other components within normal limits  PROTIME-INR - Abnormal; Notable for the following components:   Prothrombin Time 18.8 (*)    INR 1.6 (*)    All other components within normal limits  URINALYSIS, COMPLETE (UACMP) WITH MICROSCOPIC - Abnormal; Notable for the following components:   Color, Urine AMBER (*)    APPearance TURBID (*)    Hgb urine dipstick MODERATE (*)    Protein, ur 100 (*)    Leukocytes,Ua MODERATE (*)    WBC, UA >50 (*)    Bacteria, UA MANY (*)    All other components within normal limits  SARS CORONAVIRUS 2 (HOSPITAL ORDER, [facility])  CULTURE, BLOOD (ROUTINE X 2)  CULTURE, BLOOD (ROUTINE X 2)  LIPASE, BLOOD  TROPONIN I  LACTIC ACID, PLASMA  CBC WITH DIFFERENTIAL/PLATELET  LACTIC ACID, PLASMA   ____________________________________________  EKG  ED ECG REPORT I, Charline Bills ,  personally  viewed and interpreted this ECG.   Date: 07/28/2018  EKG Time: 1547 hrs.  Rate: 52 bpm  Rhythm: unchanged from previous tracings, sinus bradycardia, sinus arrhythmia, 1st degree AV block  Axis: Normal axis  Intervals:first-degree A-V block   ST&T Change: No gross ST elevations or depressions noted.  Sinus bradycardia with sinus arrhythmia and first-degree AV block.  No STEMI.  Unchanged from previous EKGs.  ____________________________________________  RADIOLOGY I personally viewed and evaluated these images as part of my medical decision making, as well as reviewing the written report by the radiologist.  Dg Chest 1 View  Result Date: 07/28/2018 CLINICAL DATA:  Weakness, shortness of breath. EXAM: CHEST  1 VIEW COMPARISON:  Radiograph of April 11, 2017. FINDINGS: The heart size and mediastinal contours are within normal limits. No pneumothorax or pleural effusion is noted. Left lung is clear. Stable elevated right hemidiaphragm is noted with associated subsegmental atelectasis. Status post right shoulder arthroplasty. IMPRESSION: Stable elevated right hemidiaphragm with associated right basilar subsegmental atelectasis. Electronically Signed   By: Marijo Conception M.D.   On: 07/28/2018 17:38   Ct Head Wo Contrast  Result Date: 07/28/2018  CLINICAL DATA:  Muscle weakness, bilateral lower extremity pain EXAM: CT HEAD WITHOUT CONTRAST TECHNIQUE: Contiguous axial images were obtained from the base of the skull through the vertex without intravenous contrast. COMPARISON:  12/24/2004 FINDINGS: Brain: No evidence of acute infarction, hemorrhage, extra-axial collection, ventriculomegaly, or mass effect. Generalized cerebral atrophy. Periventricular white matter low attenuation likely secondary to microangiopathy. Vascular: Cerebrovascular atherosclerotic calcifications are noted. Skull: Negative for fracture or focal lesion. Sinuses/Orbits: Visualized portions of the orbits are unremarkable. Visualized  portions of the paranasal sinuses and mastoid air cells are unremarkable. Other: None. IMPRESSION: No acute intracranial pathology. Electronically Signed   By: Kathreen Devoid   On: 07/28/2018 20:47   Ct Angio Chest Pe W And/or Wo Contrast  Result Date: 07/28/2018 CLINICAL DATA:  Positive D-dimer, PE suspected EXAM: CT ANGIOGRAPHY CHEST WITH CONTRAST TECHNIQUE: Multidetector CT imaging of the chest was performed using the standard protocol during bolus administration of intravenous contrast. Multiplanar CT image reconstructions and MIPs were obtained to evaluate the vascular anatomy. CONTRAST:  32mL OMNIPAQUE IOHEXOL 350 MG/ML SOLN COMPARISON:  Chest x-ray today.  Chest CT 09/20/2016 FINDINGS: Cardiovascular: Heart is normal size. Aorta is normal caliber. Scattered coronary artery calcifications in the left anterior descending coronary artery. Scattered aortic calcifications. No filling defects in the pulmonary arteries to suggest pulmonary emboli. Mediastinum/Nodes: Thyroid unremarkable. No mediastinal, hilar, or axillary adenopathy. Trachea and esophagus are unremarkable. Lungs/Pleura: Tree-in-bud gland gas nodular densities in the anterior right upper lobe are stable since 2018, likely postinflammatory scarring. No acute confluent airspace opacities or effusions. Upper Abdomen: Imaging into the upper abdomen shows no acute findings. Postoperative changes within the stomach, likely related to gastric sleeve. Musculoskeletal: Chest wall soft tissues are unremarkable. No acute bony abnormality. Review of the MIP images confirms the above findings. IMPRESSION: No evidence of pulmonary embolus. Punctate scattered left anterior descending coronary calcifications. Aortic Atherosclerosis (ICD10-I70.0). Electronically Signed   By: Rolm Baptise M.D.   On: 07/28/2018 20:47   Dg Foot Complete Left  Result Date: 07/28/2018 CLINICAL DATA:  Left foot ulcer. EXAM: LEFT FOOT - COMPLETE 3+ VIEW COMPARISON:  Radiographs of  February 16, 2012. FINDINGS: There is no evidence of fracture or dislocation. Severe degenerative changes seen involving the first metatarsophalangeal joint. Soft tissues are unremarkable. IMPRESSION: Severe osteoarthritis of first metatarsophalangeal joint. No acute abnormality is noted. Electronically Signed   By: Marijo Conception M.D.   On: 07/28/2018 17:41    ____________________________________________    PROCEDURES  Procedure(s) performed:    Procedures    Medications  sodium chloride flush (NS) 0.9 % injection 10-40 mL (40 mLs Intracatheter Given 07/28/18 2127)  sodium chloride flush (NS) 0.9 % injection 10-40 mL (has no administration in time range)  sodium chloride 0.9 % bolus 1,000 mL (0 mLs Intravenous Stopped 07/28/18 1810)  insulin aspart (novoLOG) injection 10 Units (10 Units Intravenous Given 07/28/18 2044)  dextrose 50 % solution 50 mL (50 mLs Intravenous Given 07/28/18 2044)  sodium chloride 0.9 % bolus 2,000 mL (0 mLs Intravenous Stopped 07/28/18 2230)  ciprofloxacin (CIPRO) IVPB 400 mg (0 mg Intravenous Stopped 07/28/18 2206)  calcium gluconate 1 g/ 50 mL sodium chloride IVPB (0 mg Intravenous Stopped 07/28/18 2227)  sodium bicarbonate injection 50 mEq (50 mEq Intravenous Given 07/28/18 2055)  iohexol (OMNIPAQUE) 350 MG/ML injection 60 mL (60 mLs Intravenous Contrast Given 07/28/18 2025)  sodium chloride 0.9 % bolus 1,000 mL (1,000 mLs Intravenous New Bag/Given 07/28/18 2113)     ____________________________________________   INITIAL  IMPRESSION / ASSESSMENT AND PLAN / ED COURSE  Pertinent labs & imaging results that were available during my care of the patient were reviewed by me and considered in my medical decision making (see chart for details).  Review of the [place] CSRS was performed in accordance of the [place] prior to dispensing any controlled drugs.           Patient's diagnosis is consistent with acute hyperkalemia, acute cystitis, multiple falls.  Patient presented to  the emergency department complaining of multiple falls, generalized weakness, intermittent chest pain, possible worsening foot ulcer.  On exam, it appears that foot ulcer is chronic.  Patient was neurologically intact.  However given the multiple complaints, patient had labs as well as multiple imaging modalities performed.  Differential included CVA, sepsis, dysrhythmia, PE, UTI, pneumonia, osteomyelitis.  Patient does have hyperkalemia, UTI.  No evidence of sepsis from either her foot or her urinary tract infection.  EKG is at baseline with first-degree AV block and sinus bradycardia.  Patient did have an elevated d-dimer with a negative CT angios chest.  Negative head CT.  At this time, patient has been given 3 L of fluids, calcium chloride, insulin, D50, for hyperkalemia and Cipro for her UTI.Marland Kitchen  At this time, patient is a good candidate for admission I discussed the case with the hospitalist service who agrees.  Patient will be admitted to the hospital service at this time..     ____________________________________________  FINAL CLINICAL IMPRESSION(S) / ED DIAGNOSES  Final diagnoses:  Acute hyperkalemia  Acute cystitis with hematuria  Fall, initial encounter      NEW MEDICATIONS STARTED DURING THIS VISIT:  ED Discharge Orders    None          This chart was dictated using voice recognition software/Dragon. Despite best efforts to proofread, errors can occur which can change the meaning. Any change was purely unintentional.    Darletta Moll, PA-C 07/28/18 [place], [place], MD 07/29/18 2321

## 2018-07-28 NOTE — ED Triage Notes (Signed)
Pt in via EMS from home with c/o a mechanical fall and now with pain to bilateral lower extremities. VS stable, Hx of COPD, HTN, Diabetes. Pt A&O, no LOC, no injury to head but EMS repots some dizziness with standing. Recent medication for HTN lowered. Pt had procedure on left achilles tendon 2 years ago and has limited weight bearing. Small abrasion noted to left ankle, abrasion wrapped.

## 2018-07-28 NOTE — ED Notes (Signed)
IV team at bedside 

## 2018-07-28 NOTE — ED Notes (Signed)
Patient is wearing a boot to her left foot with kerlex wrapped around her foot.  Some blood is obvious through dressing.  Provider notified.

## 2018-07-28 NOTE — Telephone Encounter (Signed)
Patient is in the ED.

## 2018-07-28 NOTE — Telephone Encounter (Signed)
Copied from [place] [id number]. Topic: Quick Communication - See Telephone Encounter >> Jul 28, 2018  1:50 PM Sheran Luz wrote: CRM for notification. See Telephone encounter for: 07/28/18.  Patient's husband calling as FYI to state that patient is being transported to ED via EMT. She has had three falls since Sunday. Patient's husband would like to speak with PCP or CMA to discuss further. Please advise.

## 2018-07-28 NOTE — ED Notes (Signed)
Vicente Males, RN at bedside attempting for IV.

## 2018-07-29 ENCOUNTER — Other Ambulatory Visit: Payer: Self-pay | Admitting: Urology

## 2018-07-29 ENCOUNTER — Encounter: Payer: Self-pay | Admitting: *Deleted

## 2018-07-29 DIAGNOSIS — W1830XA Fall on same level, unspecified, initial encounter: Secondary | ICD-10-CM | POA: Diagnosis present

## 2018-07-29 DIAGNOSIS — Z6841 Body Mass Index (BMI) 40.0 and over, adult: Secondary | ICD-10-CM | POA: Diagnosis not present

## 2018-07-29 DIAGNOSIS — Z1159 Encounter for screening for other viral diseases: Secondary | ICD-10-CM | POA: Diagnosis not present

## 2018-07-29 DIAGNOSIS — K219 Gastro-esophageal reflux disease without esophagitis: Secondary | ICD-10-CM | POA: Diagnosis present

## 2018-07-29 DIAGNOSIS — E039 Hypothyroidism, unspecified: Secondary | ICD-10-CM | POA: Diagnosis present

## 2018-07-29 DIAGNOSIS — R1310 Dysphagia, unspecified: Secondary | ICD-10-CM | POA: Diagnosis present

## 2018-07-29 DIAGNOSIS — F419 Anxiety disorder, unspecified: Secondary | ICD-10-CM | POA: Diagnosis present

## 2018-07-29 DIAGNOSIS — K589 Irritable bowel syndrome without diarrhea: Secondary | ICD-10-CM | POA: Diagnosis present

## 2018-07-29 DIAGNOSIS — J440 Chronic obstructive pulmonary disease with acute lower respiratory infection: Secondary | ICD-10-CM | POA: Diagnosis present

## 2018-07-29 DIAGNOSIS — E860 Dehydration: Secondary | ICD-10-CM | POA: Diagnosis present

## 2018-07-29 DIAGNOSIS — J209 Acute bronchitis, unspecified: Secondary | ICD-10-CM | POA: Diagnosis present

## 2018-07-29 DIAGNOSIS — L97409 Non-pressure chronic ulcer of unspecified heel and midfoot with unspecified severity: Secondary | ICD-10-CM | POA: Diagnosis present

## 2018-07-29 DIAGNOSIS — L97529 Non-pressure chronic ulcer of other part of left foot with unspecified severity: Secondary | ICD-10-CM | POA: Diagnosis present

## 2018-07-29 DIAGNOSIS — L97429 Non-pressure chronic ulcer of left heel and midfoot with unspecified severity: Secondary | ICD-10-CM | POA: Diagnosis present

## 2018-07-29 DIAGNOSIS — I10 Essential (primary) hypertension: Secondary | ICD-10-CM | POA: Diagnosis present

## 2018-07-29 DIAGNOSIS — F329 Major depressive disorder, single episode, unspecified: Secondary | ICD-10-CM | POA: Diagnosis present

## 2018-07-29 DIAGNOSIS — E875 Hyperkalemia: Secondary | ICD-10-CM | POA: Diagnosis present

## 2018-07-29 DIAGNOSIS — E1142 Type 2 diabetes mellitus with diabetic polyneuropathy: Secondary | ICD-10-CM | POA: Diagnosis present

## 2018-07-29 DIAGNOSIS — R296 Repeated falls: Secondary | ICD-10-CM | POA: Diagnosis present

## 2018-07-29 DIAGNOSIS — E11621 Type 2 diabetes mellitus with foot ulcer: Secondary | ICD-10-CM | POA: Diagnosis present

## 2018-07-29 DIAGNOSIS — I480 Paroxysmal atrial fibrillation: Secondary | ICD-10-CM | POA: Diagnosis present

## 2018-07-29 DIAGNOSIS — N390 Urinary tract infection, site not specified: Secondary | ICD-10-CM | POA: Diagnosis present

## 2018-07-29 DIAGNOSIS — E785 Hyperlipidemia, unspecified: Secondary | ICD-10-CM | POA: Diagnosis present

## 2018-07-29 DIAGNOSIS — N179 Acute kidney failure, unspecified: Secondary | ICD-10-CM | POA: Diagnosis present

## 2018-07-29 DIAGNOSIS — G4700 Insomnia, unspecified: Secondary | ICD-10-CM | POA: Diagnosis present

## 2018-07-29 LAB — BASIC METABOLIC PANEL
Anion gap: 8 (ref 5–15)
BUN: 21 mg/dL (ref 8–23)
CO2: 24 mmol/L (ref 22–32)
Calcium: 8.6 mg/dL — ABNORMAL LOW (ref 8.9–10.3)
Chloride: 109 mmol/L (ref 98–111)
Creatinine, Ser: 1.13 mg/dL — ABNORMAL HIGH (ref 0.44–1.00)
GFR calc Af Amer: 58 mL/min — ABNORMAL LOW (ref >60)
GFR calc non Af Amer: 50 mL/min — ABNORMAL LOW (ref >60)
Glucose, Bld: 106 mg/dL — ABNORMAL HIGH (ref 70–99)
Potassium: 4.5 mmol/L (ref 3.5–5.1)
Sodium: 141 mmol/L (ref 135–145)

## 2018-07-29 LAB — CBC
HCT: 33.1 % — ABNORMAL LOW (ref 36.0–46.0)
Hemoglobin: 10.8 g/dL — ABNORMAL LOW (ref 12.0–15.0)
MCH: 29 pg (ref 26.0–34.0)
MCHC: 32.6 g/dL (ref 30.0–36.0)
MCV: 89 fL (ref 80.0–100.0)
Platelets: 187 10*3/uL (ref 150–400)
RBC: 3.72 MIL/uL — ABNORMAL LOW (ref 3.87–5.11)
RDW: 13.9 % (ref 11.5–15.5)
WBC: 6 10*3/uL (ref 4.0–10.5)
nRBC: 0 % (ref 0.0–0.2)

## 2018-07-29 LAB — GLUCOSE, CAPILLARY
Glucose-Capillary: 118 mg/dL — ABNORMAL HIGH (ref 70–99)
Glucose-Capillary: 73 mg/dL (ref 70–99)
Glucose-Capillary: 130 mg/dL — ABNORMAL HIGH (ref 70–99)
Glucose-Capillary: 112 mg/dL — ABNORMAL HIGH (ref 70–99)
Glucose-Capillary: 230 mg/dL — ABNORMAL HIGH (ref 70–99)

## 2018-07-29 LAB — HEMOGLOBIN A1C
Hgb A1c MFr Bld: 6.6 % — ABNORMAL HIGH (ref 4.8–5.6)
Mean Plasma Glucose: 142.72 mg/dL

## 2018-07-29 LAB — CULTURE, URINE COMPREHENSIVE

## 2018-07-29 LAB — LACTIC ACID, PLASMA: Lactic Acid, Venous: 1.5 mmol/L (ref 0.5–1.9)

## 2018-07-29 LAB — TSH: TSH: 2.051 u[IU]/mL (ref 0.350–4.500)

## 2018-07-29 MED ORDER — SODIUM CHLORIDE 0.9% FLUSH: 3.0000 mL | INTRAVENOUS | Status: DC | PRN

## 2018-07-29 MED ORDER — GUAIFENESIN ER 600 MG PO TB12
600.0000 mg | ORAL_TABLET | Freq: Two times a day (BID) | ORAL | Status: DC
  Administered 2018-07-29 – 2018-07-30 (×4): 600 mg via ORAL
  Filled 2018-07-29 (×4): qty 1

## 2018-07-29 MED ORDER — HYDRALAZINE HCL 20 MG/ML IJ SOLN
10.0000 mg | INTRAMUSCULAR | Status: DC | PRN
  Administered 2018-07-29: 10 mg via INTRAVENOUS
  Filled 2018-07-29: qty 1

## 2018-07-29 MED ORDER — POLYETHYLENE GLYCOL 3350 17 G PO PACK: 17.0000 g | PACK | Freq: Every day | ORAL | Status: DC | PRN

## 2018-07-29 MED ORDER — LEVOTHYROXINE SODIUM 50 MCG PO TABS
175.0000 ug | ORAL_TABLET | Freq: Every day | ORAL | Status: DC
  Administered 2018-07-30: 175 ug via ORAL
  Filled 2018-07-29 (×2): qty 1

## 2018-07-29 MED ORDER — TRAZODONE HCL 50 MG PO TABS: 25.0000 mg | ORAL_TABLET | Freq: Every evening | ORAL | Status: DC | PRN

## 2018-07-29 MED ORDER — LOSARTAN POTASSIUM 50 MG PO TABS: 50.0000 mg | ORAL_TABLET | Freq: Every day | ORAL | Status: DC

## 2018-07-29 MED ORDER — ONDANSETRON HCL 4 MG PO TABS
4.0000 mg | ORAL_TABLET | Freq: Four times a day (QID) | ORAL | Status: DC | PRN
  Administered 2018-07-30: 4 mg via ORAL
  Filled 2018-07-29: qty 1

## 2018-07-29 MED ORDER — PREGABALIN 25 MG PO CAPS
25.0000 mg | ORAL_CAPSULE | Freq: Three times a day (TID) | ORAL | Status: DC
  Administered 2018-07-29 – 2018-07-30 (×4): 25 mg via ORAL
  Filled 2018-07-29 (×4): qty 1

## 2018-07-29 MED ORDER — ONDANSETRON HCL 4 MG/2ML IJ SOLN: 4.0000 mg | Freq: Four times a day (QID) | INTRAMUSCULAR | Status: DC | PRN

## 2018-07-29 MED ORDER — LORATADINE 10 MG PO TABS
5.0000 mg | ORAL_TABLET | Freq: Every evening | ORAL | Status: DC
  Administered 2018-07-29: 5 mg via ORAL
  Filled 2018-07-29: qty 1

## 2018-07-29 MED ORDER — SODIUM CHLORIDE 0.9 % IV SOLN: 250.0000 mL | INTRAVENOUS | Status: DC | PRN

## 2018-07-29 MED ORDER — PANTOPRAZOLE SODIUM 40 MG PO TBEC
40.0000 mg | DELAYED_RELEASE_TABLET | Freq: Every day | ORAL | Status: DC
  Administered 2018-07-29 – 2018-07-30 (×2): 40 mg via ORAL
  Filled 2018-07-29 (×2): qty 1

## 2018-07-29 MED ORDER — IPRATROPIUM-ALBUTEROL 0.5-2.5 (3) MG/3ML IN SOLN
3.0000 mL | Freq: Four times a day (QID) | RESPIRATORY_TRACT | Status: DC
  Administered 2018-07-29: 3 mL via RESPIRATORY_TRACT
  Filled 2018-07-29: qty 3

## 2018-07-29 MED ORDER — AMLODIPINE BESYLATE 5 MG PO TABS
10.0000 mg | ORAL_TABLET | Freq: Every day | ORAL | Status: DC
  Administered 2018-07-29 – 2018-07-30 (×2): 10 mg via ORAL
  Filled 2018-07-29 (×2): qty 2

## 2018-07-29 MED ORDER — ADULT MULTIVITAMIN W/MINERALS CH
1.0000 | ORAL_TABLET | Freq: Every day | ORAL | Status: DC
  Administered 2018-07-29 – 2018-07-30 (×2): 1 via ORAL
  Filled 2018-07-29 (×2): qty 1

## 2018-07-29 MED ORDER — METOPROLOL SUCCINATE ER 25 MG PO TB24
25.0000 mg | ORAL_TABLET | Freq: Every day | ORAL | Status: DC
  Administered 2018-07-29 – 2018-07-30 (×2): 25 mg via ORAL
  Filled 2018-07-29 (×2): qty 1

## 2018-07-29 MED ORDER — DABIGATRAN ETEXILATE MESYLATE 150 MG PO CAPS
150.0000 mg | ORAL_CAPSULE | Freq: Two times a day (BID) | ORAL | Status: DC
  Administered 2018-07-29 – 2018-07-30 (×3): 150 mg via ORAL
  Filled 2018-07-29 (×5): qty 1

## 2018-07-29 MED ORDER — FESOTERODINE FUMARATE ER 8 MG PO TB24: 8.0000 mg | ORAL_TABLET | Freq: Every day | ORAL | Status: DC

## 2018-07-29 MED ORDER — MUPIROCIN 2 % EX OINT
1.0000 "application " | TOPICAL_OINTMENT | Freq: Every day | CUTANEOUS | Status: DC
  Administered 2018-07-30: 1 via TOPICAL
  Filled 2018-07-29: qty 22

## 2018-07-29 MED ORDER — NITROFURANTOIN MONOHYD MACRO 100 MG PO CAPS: 100.0000 mg | ORAL_CAPSULE | Freq: Two times a day (BID) | ORAL | 0 refills | Status: DC

## 2018-07-29 MED ORDER — DULOXETINE HCL 30 MG PO CPEP
60.0000 mg | ORAL_CAPSULE | Freq: Every day | ORAL | Status: DC
  Administered 2018-07-29 – 2018-07-30 (×2): 60 mg via ORAL
  Filled 2018-07-29 (×2): qty 2

## 2018-07-29 MED ORDER — OXYBUTYNIN CHLORIDE ER 5 MG PO TB24
15.0000 mg | ORAL_TABLET | Freq: Every day | ORAL | Status: DC
  Administered 2018-07-29: 15 mg via ORAL
  Filled 2018-07-29 (×2): qty 1

## 2018-07-29 MED ORDER — INSULIN ASPART 100 UNIT/ML ~~LOC~~ SOLN
0.0000 [IU] | Freq: Every day | SUBCUTANEOUS | Status: DC
  Administered 2018-07-29: 2 [IU] via SUBCUTANEOUS
  Filled 2018-07-29: qty 1

## 2018-07-29 MED ORDER — AMLODIPINE BESYLATE 5 MG PO TABS: 5.0000 mg | ORAL_TABLET | Freq: Every day | ORAL | Status: DC

## 2018-07-29 MED ORDER — METOPROLOL SUCCINATE ER 50 MG PO TB24: 50.0000 mg | ORAL_TABLET | Freq: Every day | ORAL | Status: DC

## 2018-07-29 MED ORDER — INSULIN ASPART 100 UNIT/ML ~~LOC~~ SOLN
0.0000 [IU] | Freq: Three times a day (TID) | SUBCUTANEOUS | Status: DC
  Administered 2018-07-29: 2 [IU] via SUBCUTANEOUS
  Filled 2018-07-29: qty 1

## 2018-07-29 MED ORDER — MOMETASONE FURO-FORMOTEROL FUM 200-5 MCG/ACT IN AERO
2.0000 | INHALATION_SPRAY | Freq: Two times a day (BID) | RESPIRATORY_TRACT | Status: DC
  Administered 2018-07-29 – 2018-07-30 (×2): 2 via RESPIRATORY_TRACT
  Filled 2018-07-29: qty 8.8

## 2018-07-29 MED ORDER — CALCIUM CARBONATE-VITAMIN D 500-200 MG-UNIT PO TABS
2.0000 | ORAL_TABLET | Freq: Two times a day (BID) | ORAL | Status: DC
  Administered 2018-07-29 – 2018-07-30 (×4): 2 via ORAL
  Filled 2018-07-29 (×4): qty 2

## 2018-07-29 MED ORDER — BUPROPION HCL ER (XL) 150 MG PO TB24
300.0000 mg | ORAL_TABLET | Freq: Every day | ORAL | Status: DC
  Administered 2018-07-29 – 2018-07-30 (×2): 300 mg via ORAL
  Filled 2018-07-29 (×2): qty 2

## 2018-07-29 MED ORDER — ALBUTEROL SULFATE (2.5 MG/3ML) 0.083% IN NEBU: 2.5000 mg | INHALATION_SOLUTION | Freq: Four times a day (QID) | RESPIRATORY_TRACT | Status: DC | PRN

## 2018-07-29 MED ORDER — MONTELUKAST SODIUM 10 MG PO TABS
10.0000 mg | ORAL_TABLET | Freq: Every day | ORAL | Status: DC
  Administered 2018-07-29 – 2018-07-30 (×2): 10 mg via ORAL
  Filled 2018-07-29 (×2): qty 1

## 2018-07-29 MED ORDER — SODIUM CHLORIDE 0.9% FLUSH
3.0000 mL | Freq: Two times a day (BID) | INTRAVENOUS | Status: DC
  Administered 2018-07-29 – 2018-07-30 (×4): 3 mL via INTRAVENOUS

## 2018-07-29 MED ORDER — ALPRAZOLAM 0.5 MG PO TABS
0.5000 mg | ORAL_TABLET | Freq: Two times a day (BID) | ORAL | Status: DC | PRN
  Administered 2018-07-29: 0.5 mg via ORAL
  Filled 2018-07-29: qty 1

## 2018-07-29 MED ORDER — CIPROFLOXACIN IN D5W 400 MG/200ML IV SOLN
400.0000 mg | Freq: Two times a day (BID) | INTRAVENOUS | Status: DC
  Administered 2018-07-29 (×2): 400 mg via INTRAVENOUS
  Filled 2018-07-29 (×4): qty 200

## 2018-07-29 MED ORDER — HYDROCHLOROTHIAZIDE 25 MG PO TABS: 12.5000 mg | ORAL_TABLET | Freq: Every day | ORAL | Status: DC

## 2018-07-29 MED ORDER — MUPIROCIN 2 % EX OINT
1.0000 "application " | TOPICAL_OINTMENT | Freq: Two times a day (BID) | CUTANEOUS | Status: DC
  Administered 2018-07-29: 1 via TOPICAL
  Filled 2018-07-29: qty 22

## 2018-07-29 MED ORDER — IPRATROPIUM-ALBUTEROL 0.5-2.5 (3) MG/3ML IN SOLN
3.0000 mL | Freq: Four times a day (QID) | RESPIRATORY_TRACT | Status: DC
  Administered 2018-07-29 – 2018-07-30 (×3): 3 mL via RESPIRATORY_TRACT
  Filled 2018-07-29 (×5): qty 3

## 2018-07-29 MED ORDER — HYDROCODONE-ACETAMINOPHEN 5-325 MG PO TABS: 1.0000 | ORAL_TABLET | Freq: Three times a day (TID) | ORAL | Status: DC | PRN

## 2018-07-29 MED ORDER — ACETAMINOPHEN 325 MG PO TABS: 650.0000 mg | ORAL_TABLET | Freq: Four times a day (QID) | ORAL | Status: DC | PRN

## 2018-07-29 MED ORDER — ACETAMINOPHEN 650 MG RE SUPP: 650.0000 mg | Freq: Four times a day (QID) | RECTAL | Status: DC | PRN

## 2018-07-29 NOTE — Consult Note (Addendum)
[facility] Nurse wound consult note Reason for Consult: Consult requested for left heel.  Pt is followed as an outpatient by Dr [name] and he has ordered Regranix gel for topical wound care Q day to left plantar and posterior heel chronic full thickness wounds. Pt states the wounds have greatly improved. This medication is not available in the [facility]; informed patient and she states she will have her husband bring in from home for use while she is in the hospital.  Directions for dressing changes provided for staff nurses to perform daily.  Wound type: Left plantar heel with full thickness wound; .3X.3X.1cm, red and moist, no odor, small amt pink drainage.  Left posterior heel with full thickness wound; .4X.2X.1cm, red and dry, no odor or drainage Left calf with partial thickness abrasions related to a previous fall; patchy areas across area which is approx 6X6cm, each is  .1cm depth, red and dry.   Dressing procedure/placement/frequency: Continue present plan of care with daily Regranix to heel wounds. Bactroban has already been ordered for partial thicknes wounds to calf, foam dressing to protect from further injury. Pt should follow-up with Dr [name] after discharge.   Please re-consult if further assistance is needed.  Thank-you,  Julien Girt MSN, [place], [place], [place], [place]

## 2018-07-29 NOTE — ED Notes (Signed)
Pt states purewick did not collect all urine and that bed is wet. Peri care provided. Linens changed. Pt's husband brought personal bottoms. Pt assisted into these.

## 2018-07-29 NOTE — Telephone Encounter (Signed)
Pt has been admitted.  Pt was dehydrated, has uti, disoriented, and had falls.  Husband just wanted you to know what is going on. No call back needed

## 2018-07-29 NOTE — Progress Notes (Signed)
Family Meeting Note  Advance Directive:yes  Today a meeting took place with the Patient.   The following clinical team members were present during this meeting:MD  The following were discussed:Patient's diagnosis: Recurrent UTI, falls, obesity, weakness, Patient's progosis: Unable to determine and Goals for treatment: Full Code As per patient she has living will papers done and her husband is her power of attorney.  She would like to have a full trial of resuscitation treatments for short time but if she does not have a good chances of recovery then she would not like to stay for long on the ventilatory machine and supports and she had made this clear to her family already. Additional follow-up to be provided: MD  Time spent during discussion:20 minutes  Vaughan Basta, MD

## 2018-07-29 NOTE — ED Notes (Signed)
Pt assisted into personal gown from home. [name] and this RN repositioned pt in bed.

## 2018-07-29 NOTE — Telephone Encounter (Signed)
Message relayed to provider.  Nathanael Krist,cma

## 2018-07-29 NOTE — ED Notes (Signed)
BG 118. Pt given more water.

## 2018-07-29 NOTE — ED Notes (Addendum)
Pt has open circular wound to bottom of L heal. Cleansed. Dressing changed. L ankle tendon taken out in recent operation. Pt still has red wound. Dressing remove, wound cleansed with NS, new dressing applied.

## 2018-07-29 NOTE — H&P (Signed)
Attending physician admission note:  I have seen and examined the patient with my Nurse practitioner, Gardiner Barefoot on 07/28/2018.  The patient presents with generalized weakness with multiple falls over the last week, urinary frequency, urgency and dysuria.  No presyncope or syncope.  She has been having dyspnea, cough and wheezing that she states have been going on over the last 2 and half months.  She admits to mild paroxysmal nocturnal dyspnea without orthopnea or worsening lower extremity edema though.  No chest pain.  She has a left heel diabetic ulcer.  Upon physical examination:  Generally: Pleasant elderly Caucasian female in no acute distress Cardiovascular: Regular rate and rhythm with normal S1-S2 and no murmurs gallops or rubs. Respiratory: She has occasional wheezing and bibasilar rales.  Abdomen: Soft, nontender, nondistended with positive bowel sounds and no palpable megaly or masses. Extremities: Trace to 1+ bilateral lower extremity pitting edema with no clubbing or cyanosis. Skin: Dressed left heel ulcer.  Labs and radiographic studies were reviewed.  Assessment/plan: The patient will be admitted to a medical leave monitored bed for further management of her UTI with IV Cipro while following urine culture and sensitivity, hyperkalemia which is being aggressively managed, left foot diabetic heel ulcer, acute kidney injury with hydration and follow-up on BMP as well as possibly mild acute bronchitis that should be covered with Cipro and added mucolytics and PRN duo nebs.  For further details please refer to dictated admission H&P.  I have discussed the case with my nurse practitioner. I agree with the admission note and the rest of the plan of care as delineated by my nurse practitioner.

## 2018-07-29 NOTE — H&P (Addendum)
[place] [facility] [place] NAME: [name]    MR#:  [id number]  DATE OF BIRTH:  September 29, 1950  DATE OF ADMISSION:  07/28/2018  PRIMARY CARE PHYSICIAN: [name], [name], MD   REQUESTING/REFERRING PHYSICIAN: Betha Loa, PA-C  CHIEF COMPLAINT:   Chief Complaint  Patient presents with   Fall   Leg Pain    HISTORY OF PRESENT ILLNESS:  [name]  is a 68 y.o. female with a known history of hypertension, diabetes, COPD, as well as DVT to right lower extremity.  She presented to the emergency room complaining of generalized weakness with multiple falls over the last week having most recent mechanical fall earlier in the day with increased pain to bilateral lower extremities.  Patient denies loss of consciousness at the time of the fall.  She denies dizziness or vertigo.  She is short of breath however, she denies increased shortness of breath or chest pain.  She denies palpitations.  She has noticed 7 to 10-day period of urinary frequency with dysuria and malodorous urine as well as dark urine output.  She reports having been seen at a primary care clinic and treated with Macrodantin with no improvement of symptoms.  She denies nausea or vomiting.  She has noted subjective fevers and chills.  She denies abdominal pain.  She has noted no hematuria.  Patient has bilateral lower extremity erythema below the knee as well as a left heel diabetic foot ulcer which is being followed by  Dr. [name].  She has noted no increased drainage or erythema surrounding the area.  On arrival to the emergency room, urinalysis demonstrates moderate leukocytes and large amount bacteria.  There is evidence of acute kidney injury with creatinine 1.43 with BUN 28.  Also hyperkalemia with a potassium of 6.1.  Patient was treated in the emergency room with IV insulin and calcium gluconate.  Plan to repeat potassium level in the a.m.  Also, chest x-ray demonstrated  no acute pulmonary disease.  D-dimer was found to be elevated.  Therefore CTA chest was completed with no evidence of pulmonary embolism.  No leukocytosis noted.  Lactic acid is 1.7.  Patient has been admitted to the hospitalist service for UTI with hyperkalemia.    PAST MEDICAL HISTORY:   Past Medical History:  Diagnosis Date   Abnormal antibody titer    Anxiety and depression    Arthritis    Asthma    Cystocele    Depression    Diabetes ([place])    DVT (deep venous thrombosis) (HCC)    Righ calf   Dysrhythmia    Edema    GERD (gastroesophageal reflux disease)    Heart murmur    Echocardiogram June 2015: Mild MR (possible vegetation seen on TTE, not seen on TEE), normal LV size with moderate concentric LVH. Normal function EF 60-65%. Normal diastolic function. Mild LA dilation.   History of IBS    History of methicillin resistant staphylococcus aureus (MRSA) 2008   Hyperlipidemia    Hypertension    Hypothyroidism    Insomnia    Kidney stone    kidney stones with lithotripsy   Neuropathy involving both lower extremities    Obesity, Class II, BMI 35-39.9, with comorbidity    Paroxysmal atrial fibrillation ([place]) 2007   In June 2015, Cardiac Event Monitor: Mostly SR/sinus arrhythmia with PVCs that are frequent. Short bursts of A. fib lasting several minutes;; CHA2DS2-VASc Score = 5 (age, Female, PVD, DM, HTN)  Peripheral vascular disease ([place])    Sleep apnea    use C-PAP   Urinary incontinence    Venous stasis dermatitis of both lower extremities     PAST SURGICAL HISTORY:   Past Surgical History:  Procedure Laterality Date   ANKLE SURGERY Right    APPLICATION OF WOUND VAC Left 06/27/2015   Procedure: APPLICATION OF WOUND VAC ( POSSIBLE ) ;  Surgeon: [name], MD;  Location: [facility] ORS;  Service: Vascular;  Laterality: Left;   APPLICATION OF WOUND VAC Left 09/25/2016   Procedure: APPLICATION OF WOUND VAC;  Surgeon: [name], DPM;   Location: [facility] ORS;  Service: Podiatry;  Laterality: Left;   arm surgery     right     CHOLECYSTECTOMY     COLONOSCOPY     COLONOSCOPY WITH PROPOFOL N/A 01/11/2017   Procedure: COLONOSCOPY WITH PROPOFOL;  Surgeon: [name], MD;  Location: [facility] ENDOSCOPY;  Service: Endoscopy;  Laterality: N/A;   COLONOSCOPY WITH PROPOFOL N/A 02/22/2017   Procedure: COLONOSCOPY WITH PROPOFOL;  Surgeon: [name], MD;  Location: [facility] ENDOSCOPY;  Service: Endoscopy;  Laterality: N/A;   COLONOSCOPY WITH PROPOFOL N/A 05/31/2017   Procedure: COLONOSCOPY WITH PROPOFOL;  Surgeon: [name], MD;  Location: [facility] ENDOSCOPY;  Service: Endoscopy;  Laterality: N/A;   ESOPHAGOGASTRODUODENOSCOPY (EGD) WITH PROPOFOL N/A 01/11/2017   Procedure: ESOPHAGOGASTRODUODENOSCOPY (EGD) WITH PROPOFOL;  Surgeon: [name], MD;  Location: [facility] ENDOSCOPY;  Service: Endoscopy;  Laterality: N/A;   HERNIA REPAIR     umbilical   HIATAL HERNIA REPAIR     I&D EXTREMITY Left 06/27/2015   Procedure: IRRIGATION AND DEBRIDEMENT EXTREMITY            ( CALF HEMATOMA ) POSSIBLE WOUND VAC;  Surgeon: [name], MD;  Location: [facility] ORS;  Service: Vascular;  Laterality: Left;   INCISION AND DRAINAGE OF WOUND Left 09/25/2016   Procedure: IRRIGATION AND DEBRIDEMENT - PARTIAL RESECTION OF ACHILLES TENDON WITH WOUND VAC APPLICATION;  Surgeon: [name], DPM;  Location: [facility] ORS;  Service: Podiatry;  Laterality: Left;   IRRIGATION AND DEBRIDEMENT ABSCESS Left 09/07/2016   Procedure: IRRIGATION AND DEBRIDEMENT ABSCESS LEFT HEEL;  Surgeon: [name], DPM;  Location: [facility] ORS;  Service: Podiatry;  Laterality: Left;   JOINT REPLACEMENT  2013   left knee replacement   KIDNEY SURGERY Right    kidney stones   LITHOTRIPSY     LOWER EXTREMITY ANGIOGRAPHY Left 04/04/2018   Procedure: LOWER EXTREMITY ANGIOGRAPHY;  Surgeon: [name], MD;  Location: [facility] LAB;  Service: Cardiovascular;   Laterality: Left;   NM GATED MYOVIEW [facility] HX)  February 2017   Likely breast attenuation. LOW RISK study. Normal EF 55-60%.   OTHER SURGICAL HISTORY     08/2016 or 09/2016 surgery on achilles tenso h/o staph infection heal. Dr. [name]   removal of left hematoma Left    leg   REVERSE SHOULDER ARTHROPLASTY Right 10/08/2015   Procedure: REVERSE SHOULDER ARTHROPLASTY;  Surgeon: [name], MD;  Location: [facility] ORS;  Service: Orthopedics;  Laterality: Right;   SLEEVE GASTROPLASTY     TONSILLECTOMY     TOTAL KNEE ARTHROPLASTY     TOTAL SHOULDER REPLACEMENT Right    TRANSESOPHAGEAL ECHOCARDIOGRAM  08/08/2013   Mild LVH, EF 60-65%. Moderate LA dilation and mild RA dilation. Mild MR with no evidence of stenosis and no evidence of endocarditis. A false chordae is noted.   TRANSTHORACIC ECHOCARDIOGRAM  08/03/2013  Mild-Moderate concentric LVH, EF 60-65%. Normal diastolic function. Mild LA dilation. Mild MR with possible vegetation  - not confirmed on TEE    ULNAR NERVE TRANSPOSITION Right 08/06/2016   Procedure: ULNAR NERVE DECOMPRESSION/TRANSPOSITION;  Surgeon: [name], MD;  Location: [facility] ORS;  Service: Orthopedics;  Laterality: Right;   UPPER GI ENDOSCOPY     VAGINAL HYSTERECTOMY      SOCIAL HISTORY:   Social History   Tobacco Use   Smoking status: Never Smoker   Smokeless tobacco: Never Used  Substance Use Topics   Alcohol use: No    Alcohol/week: 0.0 standard drinks    FAMILY HISTORY:   Family History  Problem Relation Age of Onset   Skin cancer Father    Diabetes Father    Hypertension Father    Peripheral vascular disease Father    Cancer Father    Cerebral aneurysm Father    Alcohol abuse Father    Varicose Veins Mother    Kidney disease Mother    Arthritis Mother    Mental illness Sister    Cancer Maternal Aunt        breast   Breast cancer Maternal Aunt    Arthritis Maternal Grandmother    Hypertension Maternal Grandmother     Diabetes Maternal Grandmother    Arthritis Maternal Grandfather    Heart disease Maternal Grandfather    Hypertension Maternal Grandfather    Arthritis Paternal Grandmother    Hypertension Paternal Grandmother    Diabetes Paternal Grandmother    Arthritis Paternal Grandfather    Hypertension Paternal Grandfather    Bladder Cancer Neg Hx    Kidney cancer Neg Hx     DRUG ALLERGIES:   Allergies  Allergen Reactions   Penicillins Hives, Shortness Of Breath, Swelling and Other (See Comments)    Facial swelling Has patient had a PCN reaction causing immediate rash, facial/tongue/throat swelling, SOB or lightheadedness with hypotension: Yes Has patient had a PCN reaction causing severe rash involving mucus membranes or skin necrosis: Yes Has patient had a PCN reaction that required hospitalization No Has patient had a PCN reaction occurring within the last 10 years: No If all of the above answers are "NO", then may proceed with Cephalosporin use.    Aspirin Hives   Morphine And Related Other (See Comments)    Patient becomes very confused   Terramycin [Oxytetracycline] Hives    REVIEW OF SYSTEMS:   Review of Systems  Constitutional: Positive for chills, fever and malaise/fatigue.  HENT: Negative for congestion, ear pain and sore throat.   Eyes: Negative for blurred vision and double vision.  Respiratory: Positive for wheezing. Negative for cough, hemoptysis, sputum production and shortness of breath.   Cardiovascular: Negative for chest pain, palpitations and leg swelling.  Gastrointestinal: Positive for heartburn and nausea. Negative for abdominal pain, diarrhea and vomiting.  Genitourinary: Positive for dysuria, frequency and urgency. Negative for flank pain and hematuria.  Musculoskeletal: Positive for falls (two mechanical falls within the last 7 days) and myalgias. Negative for back pain and neck pain.  Skin: Negative for itching and rash.  Neurological: Positive  for weakness. Negative for dizziness, speech change, loss of consciousness and headaches.  Psychiatric/Behavioral: Negative.      MEDICATIONS AT HOME:   Prior to Admission medications   Medication Sig Start Date End Date Taking? Authorizing Provider  albuterol (PROVENTIL HFA;VENTOLIN HFA) 108 (90 Base) MCG/ACT inhaler Inhale 1-2 puffs into the lungs every 4 (four) hours as needed for wheezing or  shortness of breath. Patient taking differently: Inhale 2 puffs into the lungs every 4 (four) hours as needed for wheezing or shortness of breath.  03/29/15  Yes Bobetta Lime, MD  albuterol (PROVENTIL) (2.5 MG/3ML) 0.083% nebulizer solution Take 3 mLs (2.5 mg total) by nebulization every 6 (six) hours as needed for wheezing or shortness of breath. 02/21/15  Yes Ashok Norris, MD  ALPRAZolam Duanne Moron) 0.5 MG tablet Take 1 tablet (0.5 mg total) by mouth 2 (two) times daily as needed for anxiety. 07/05/18  Yes [name], [name], MD  amLODipine (NORVASC) 5 MG tablet Take 1 tablet (5 mg total) by mouth daily. 04/20/18  Yes [name], [name], MD  buPROPion (WELLBUTRIN XL) 300 MG 24 hr tablet Take 1 tablet (300 mg total) by mouth daily. 07/16/17  Yes [name], [name], MD  Calcium Citrate-Vitamin D (CALCIUM + D PO) Take 1 tablet by mouth 2 (two) times daily.   Yes [provider]  clobetasol cream (TEMOVATE) 0.27 % Apply 1 application topically 2 (two) times daily. To arms Patient taking differently: Apply 1 application topically 2 (two) times daily as needed (irritation). To arms 02/03/18  Yes [name], [name], MD  dabigatran (PRADAXA) 150 MG CAPS capsule Take 1 capsule (150 mg total) by mouth 2 (two) times daily. 05/24/18  Yes Wellington Hampshire, MD  Dexlansoprazole (DEXILANT) 30 MG capsule Take 1 capsule (30 mg total) by mouth daily. Note lower maintenance dose take 30 min before food lunch or dinner not with thyroid medication 05/17/18  Yes [name], [name], MD    diclofenac sodium (VOLTAREN) 1 % GEL Apply 1 application topically daily as needed (pain).    Yes [provider]  DULoxetine (CYMBALTA) 60 MG capsule Take 1 capsule (60 mg total) by mouth daily. 06/08/18 09/06/18 Yes Vevelyn Francois, NP  estradiol (ESTRACE) 0.1 MG/GM vaginal cream Apply 0.5mg  (pea-sized amount)  just inside the vaginal introitus with a finger-tip on Monday, Wednesday and Friday nights, 12/15/17  Yes McGowan, [name] A, PA-C  fesoterodine (TOVIAZ) 8 MG TB24 tablet Take 1 tablet (8 mg total) by mouth daily. 07/13/18  Yes McGowan, [name] A, PA-C  fluticasone-salmeterol (ADVAIR HFA) 115-21 MCG/ACT inhaler Inhale 2 puffs into the lungs daily. Rinse mouth thoroughly after use 07/18/18  Yes Wilhelmina Mcardle, MD  hydrochlorothiazide (HYDRODIURIL) 12.5 MG tablet Take 1 tablet (12.5 mg total) by mouth daily. In am 04/06/18  Yes [name], [name], MD  HYDROcodone-acetaminophen (NORCO/VICODIN) 5-325 MG tablet Take 1 tablet by mouth every 8 (eight) hours as needed for up to 30 days for severe pain. 08/07/18 09/06/18 Yes Vevelyn Francois, NP  HYDROcodone-acetaminophen (NORCO/VICODIN) 5-325 MG tablet Take 1 tablet by mouth every 8 (eight) hours as needed for up to 30 days for severe pain. 07/08/18 08/07/18 Yes King, Diona Foley, NP  levocetirizine (XYZAL) 5 MG tablet TAKE 1 TABLET BY MOUTH EVERY EVENING 08/27/17  Yes [name], [name], MD  levothyroxine (SYNTHROID, LEVOTHROID) 175 MCG tablet Take 1 tablet (175 mcg total) by mouth daily before breakfast. 06/21/18  Yes [name], [name], MD  losartan (COZAAR) 50 MG tablet Take 1 tablet (50 mg total) by mouth daily. Do not take quinapril 40 05/12/18  Yes [name], [name], MD  metFORMIN (GLUCOPHAGE) 1000 MG tablet Take 1 tablet (1,000 mg total) by mouth 2 (two) times daily. With food 05/26/18  Yes [name], [name], MD  metoprolol succinate (TOPROL-XL) 50 MG 24 hr tablet Take 1 tablet (50 mg total) by mouth daily.  Take with or  immediately following a meal. 03/24/18 07/28/18 Yes Arida, Mertie Clause, MD  montelukast (SINGULAIR) 10 MG tablet Take 1 tablet (10 mg total) by mouth daily. 02/22/18  Yes [name], [name], MD  mupirocin ointment (BACTROBAN) 2 % Apply 1 application topically 2 (two) times daily. Skin wounds 10/04/17  Yes [name], [name], MD  nystatin cream (MYCOSTATIN) Apply 1 application topically 2 (two) times daily. Mouth prn for 7 days as needed Patient taking differently: Apply 1 application topically 2 (two) times daily as needed for dry skin. Mouth prn for 7 days as needed 11/17/17  Yes [name], [name], MD  ondansetron (ZOFRAN) 4 MG tablet Take 1 tablet (4 mg total) by mouth every 8 (eight) hours as needed for nausea or vomiting. 05/19/18  Yes [name], [name], MD  oxybutynin (DITROPAN XL) 15 MG 24 hr tablet TAKE ONE TABLET BY MOUTH AT BEDTIME 12/30/17  Yes McGowan, Shannon A, PA-C  pregabalin (LYRICA) 25 MG capsule Take 1 capsule (25 mg total) by mouth 3 (three) times daily. 06/23/18 09/21/18 Yes Vevelyn Francois, NP  zinc oxide 20 % ointment Apply 1 application topically as needed for irritation. 09/02/17  Yes McGowan, [name] A, PA-C  nitrofurantoin, macrocrystal-monohydrate, (MACROBID) 100 MG capsule Take 1 capsule (100 mg total) by mouth every 12 (twelve) hours. Patient not taking: Reported on 07/28/2018 05/30/18   Bjorn Loser, MD      VITAL SIGNS:  Blood pressure (!) 141/52, pulse 60, temperature 97.6 F (36.4 C), temperature source Oral, resp. rate 16, height 5\' 2"  (1.575 m), weight 113.4 kg, SpO2 95 %.  PHYSICAL EXAMINATION:  Physical Exam Vitals signs and nursing note reviewed.  Constitutional:      General: She is not in acute distress.    Appearance: Normal appearance. She is obese. She is not ill-appearing.  HENT:     Head: Normocephalic and atraumatic.     Nose: Nose normal. No congestion or rhinorrhea.     Mouth/Throat:     Mouth: Mucous membranes are moist.      Pharynx: Oropharynx is clear.  Eyes:     General: No scleral icterus.    Conjunctiva/sclera: Conjunctivae normal.     Pupils: Pupils are equal, round, and reactive to light.  Neck:     Musculoskeletal: Normal range of motion and neck supple.  Cardiovascular:     Rate and Rhythm: Normal rate and regular rhythm.     Pulses: Normal pulses.     Heart sounds: Normal heart sounds. No murmur. No gallop.   Pulmonary:     Effort: Pulmonary effort is normal. No respiratory distress.     Breath sounds: Wheezing present. No rhonchi or rales.  Abdominal:     General: Abdomen is flat. Bowel sounds are normal. There is no distension.     Palpations: Abdomen is soft. There is no mass.     Tenderness: There is no abdominal tenderness. There is no right CVA tenderness or left CVA tenderness.  Musculoskeletal: Normal range of motion.        General: No swelling.  Lymphadenopathy:     Cervical: No cervical adenopathy.  Skin:    General: Skin is warm.     Capillary Refill: Capillary refill takes less than 2 seconds.     Findings: Erythema (to bilateral lower extremities below the knee) and lesion (Left heel wound with dark center and surrounding erythema) present.  Neurological:     General: No focal deficit present.  Mental Status: She is alert and oriented to person, place, and time.     Cranial Nerves: No cranial nerve deficit.  Psychiatric:        Mood and Affect: Mood normal.        Behavior: Behavior normal.     GENERAL:  68 y.o.-year-old patient lying in the bed with no acute distress.  EYES: Pupils equal, round, reactive to light and accommodation. No scleral icterus. Extraocular muscles intact.  HEENT: Head atraumatic, normocephalic. Oropharynx and nasopharynx clear.  NECK:  Supple, no jugular venous distention. No thyroid enlargement, no tenderness.  LUNGS: Normal breath sounds bilaterally, no wheezing, rales,rhonchi or crepitation. No use of accessory muscles of respiration.    CARDIOVASCULAR: Regular rate and rhythm, S1, S2 normal. No murmurs, rubs, or gallops.  ABDOMEN: Soft, nondistended, nontender. Bowel sounds present. No organomegaly or mass.  EXTREMITIES: No pedal edema, cyanosis, or clubbing.  NEUROLOGIC: Cranial nerves II through XII are intact. Muscle strength 5/5 in all extremities. Sensation intact. Gait not checked.  PSYCHIATRIC: The patient is alert and oriented x 3.  Normal affect and good eye contact. SKIN: No obvious rash, lesion, or ulcer.   LABORATORY PANEL:   CBC Recent Labs  Lab 07/28/18 1550  WBC 7.6  HGB 12.1  HCT 37.7  PLT 234   ------------------------------------------------------------------------------------------------------------------  Chemistries  Recent Labs  Lab 07/28/18 1550  NA 136  K 6.1*  CL 103  CO2 23  GLUCOSE 106*  BUN 28*  CREATININE 1.43*  CALCIUM 8.6*  AST 19  ALT 13  ALKPHOS 81  BILITOT 0.6   ------------------------------------------------------------------------------------------------------------------  Cardiac Enzymes Recent Labs  Lab 07/28/18 1550  TROPONINI <0.03   ------------------------------------------------------------------------------------------------------------------  RADIOLOGY:  Dg Chest 1 View  Result Date: 07/28/2018 CLINICAL DATA:  Weakness, shortness of breath. EXAM: CHEST  1 VIEW COMPARISON:  Radiograph of April 11, 2017. FINDINGS: The heart size and mediastinal contours are within normal limits. No pneumothorax or pleural effusion is noted. Left lung is clear. Stable elevated right hemidiaphragm is noted with associated subsegmental atelectasis. Status post right shoulder arthroplasty. IMPRESSION: Stable elevated right hemidiaphragm with associated right basilar subsegmental atelectasis. Electronically Signed   By: Marijo Conception M.D.   On: 07/28/2018 17:38   Ct Head Wo Contrast  Result Date: 07/28/2018 CLINICAL DATA:  Muscle weakness, bilateral lower extremity pain  EXAM: CT HEAD WITHOUT CONTRAST TECHNIQUE: Contiguous axial images were obtained from the base of the skull through the vertex without intravenous contrast. COMPARISON:  12/24/2004 FINDINGS: Brain: No evidence of acute infarction, hemorrhage, extra-axial collection, ventriculomegaly, or mass effect. Generalized cerebral atrophy. Periventricular white matter low attenuation likely secondary to microangiopathy. Vascular: Cerebrovascular atherosclerotic calcifications are noted. Skull: Negative for fracture or focal lesion. Sinuses/Orbits: Visualized portions of the orbits are unremarkable. Visualized portions of the paranasal sinuses and mastoid air cells are unremarkable. Other: None. IMPRESSION: No acute intracranial pathology. Electronically Signed   By: Kathreen Devoid   On: 07/28/2018 20:47   Ct Angio Chest Pe W And/or Wo Contrast  Result Date: 07/28/2018 CLINICAL DATA:  Positive D-dimer, PE suspected EXAM: CT ANGIOGRAPHY CHEST WITH CONTRAST TECHNIQUE: Multidetector CT imaging of the chest was performed using the standard protocol during bolus administration of intravenous contrast. Multiplanar CT image reconstructions and MIPs were obtained to evaluate the vascular anatomy. CONTRAST:  80mL OMNIPAQUE IOHEXOL 350 MG/ML SOLN COMPARISON:  Chest x-ray today.  Chest CT 09/20/2016 FINDINGS: Cardiovascular: Heart is normal size. Aorta is normal caliber. Scattered coronary artery calcifications in the  left anterior descending coronary artery. Scattered aortic calcifications. No filling defects in the pulmonary arteries to suggest pulmonary emboli. Mediastinum/Nodes: Thyroid unremarkable. No mediastinal, hilar, or axillary adenopathy. Trachea and esophagus are unremarkable. Lungs/Pleura: Tree-in-bud gland gas nodular densities in the anterior right upper lobe are stable since 2018, likely postinflammatory scarring. No acute confluent airspace opacities or effusions. Upper Abdomen: Imaging into the upper abdomen shows no  acute findings. Postoperative changes within the stomach, likely related to gastric sleeve. Musculoskeletal: Chest wall soft tissues are unremarkable. No acute bony abnormality. Review of the MIP images confirms the above findings. IMPRESSION: No evidence of pulmonary embolus. Punctate scattered left anterior descending coronary calcifications. Aortic Atherosclerosis (ICD10-I70.0). Electronically Signed   By: Rolm Baptise M.D.   On: 07/28/2018 20:47   Dg Foot Complete Left  Result Date: 07/28/2018 CLINICAL DATA:  Left foot ulcer. EXAM: LEFT FOOT - COMPLETE 3+ VIEW COMPARISON:  Radiographs of February 16, 2012. FINDINGS: There is no evidence of fracture or dislocation. Severe degenerative changes seen involving the first metatarsophalangeal joint. Soft tissues are unremarkable. IMPRESSION: Severe osteoarthritis of first metatarsophalangeal joint. No acute abnormality is noted. Electronically Signed   By: Marijo Conception M.D.   On: 07/28/2018 17:41      IMPRESSION AND PLAN:   1.  UTI - Patient started on IV antibiotic therapy with ciprofloxacin given her severe allergy to PCN. - Urine culture is pending.  Will adjust treatment based on results when available. - Patient received 2 L IV normal saline bolus in the emergency room. - We will repeat CBC and BMP in the a.m.  2.  Hyperkalemia - With initial potassium 6.1.  Patient received IV insulin and calcium gluconate treatment.  --Will repeat potassium level at 200 and treat expectantly. - Patient will be placed on telemetry monitoring given the possibility for arrhythmias associated with hyperkalemia.  3.  Left diabetic foot ulcer - We will treat likelihood of underlying cellulitis with IV ciprofloxacin as patient is already being treated for urinary tract infection with IV antibiotic therapy. - We will consult wound care management  4. acute kidney injury - Likely secondary to unresolved urinary tract infection - Patient received IV normal  saline bolus on arrival to the emergency room.  We will continue to monitor renal function closely with repeated BMP in the a.m.  May consider renal ultrasound if no improvement or if worse.  5.  Diabetes mellitus - Home management has been continued.  Will monitor with Accu-Cheks and treat with sliding scale insulin  6. acute bronchitis - We will manage with already above ordered IV antibiotic therapy including ciprofloxacin. - Also patient will have Mucinex twice daily as well as PRN DuoNeb treatments.   All the records are reviewed and case discussed with ED provider. The plan of care was discussed in details with the patient (and family). I answered all questions. The patient agreed to proceed with the above mentioned plan. Further management will depend upon hospital course.   CODE STATUS: Full code  TOTAL TIME TAKING CARE OF THIS PATIENT:  45 minutes.    [place] Camel M.D on 07/29/2018 at 12:27 AM  Pager - [phone]  After 6pm go to [email or web address] - Proofreader  [facility]  Office  [phone]  CC: Primary care physician; [name], [name], MD   Note: This dictation was prepared with Dragon dictation along with smaller phrase technology. Any transcriptional errors that result from this process are unintentional.

## 2018-07-29 NOTE — ED Notes (Signed)
HOB adjusted for pt; lights dimmed; rails up; bed in lowest position; call bell within reach.

## 2018-07-29 NOTE — Progress Notes (Signed)
PT Cancellation Note  Patient Details Name: [name] MRN: [id number] DOB: [birth year]   Cancelled Treatment:    Reason Eval/Treat Not Completed: Other (comment) Chart reviewed. Nursing consulted. Patient requesting spouse bring off-loading/wedge shoe to hospital for safe mobilization with respect to L foot wound on plantar surface of calcaneus. PT will follow up at a later time/date.  Myles Gip PT, DPT [phone] 07/29/2018, 10:33 AM

## 2018-07-29 NOTE — Progress Notes (Signed)
[place] [facility] [place] NAME: Sunnie Odden    MR#:  [id number]  DATE OF BIRTH:  09/19/1950  SUBJECTIVE:  CHIEF COMPLAINT:   Chief Complaint  Patient presents with  . Fall  . Leg Pain  Came with frequent falls and noted to have UTI and some dehydration.  REVIEW OF SYSTEMS:  CONSTITUTIONAL: No fever, have fatigue or weakness.  EYES: No blurred or double vision.  EARS, NOSE, AND THROAT: No tinnitus or ear pain.  RESPIRATORY: No cough, shortness of breath, wheezing or hemoptysis.  CARDIOVASCULAR: No chest pain, orthopnea, edema.  GASTROINTESTINAL: No nausea, vomiting, diarrhea or abdominal pain.  GENITOURINARY: No dysuria, hematuria.  ENDOCRINE: No polyuria, nocturia,  HEMATOLOGY: No anemia, easy bruising or bleeding SKIN: No rash or lesion. MUSCULOSKELETAL: No joint pain or arthritis.   NEUROLOGIC: No tingling, numbness, weakness.  PSYCHIATRY: No anxiety or depression.   ROS  DRUG ALLERGIES:   Allergies  Allergen Reactions  . Penicillins Hives, Shortness Of Breath, Swelling and Other (See Comments)    Facial swelling Has patient had a PCN reaction causing immediate rash, facial/tongue/throat swelling, SOB or lightheadedness with hypotension: Yes Has patient had a PCN reaction causing severe rash involving mucus membranes or skin necrosis: Yes Has patient had a PCN reaction that required hospitalization No Has patient had a PCN reaction occurring within the last 10 years: No If all of the above answers are "NO", then may proceed with Cephalosporin use.   . Aspirin Hives  . Morphine And Related Other (See Comments)    Patient becomes very confused  . Terramycin [Oxytetracycline] Hives    VITALS:  Blood pressure (!) 159/74, pulse 71, temperature 97.8 F (36.6 C), temperature source Oral, resp. rate 18, height 5\' 2"  (1.575 m), weight 113.4 kg, SpO2 97 %.  PHYSICAL EXAMINATION:  GENERAL:  68 y.o.-year-old obese patient lying in the  bed with no acute distress.  EYES: Pupils equal, round, reactive to light and accommodation. No scleral icterus. Extraocular muscles intact.  HEENT: Head atraumatic, normocephalic. Oropharynx and nasopharynx clear.  NECK:  Supple, no jugular venous distention. No thyroid enlargement, no tenderness.  LUNGS: Normal breath sounds bilaterally, no wheezing, rales,rhonchi or crepitation. No use of accessory muscles of respiration.  CARDIOVASCULAR: S1, S2 normal. No murmurs, rubs, or gallops.  ABDOMEN: Soft, nontender, nondistended. Bowel sounds present. No organomegaly or mass.  EXTREMITIES: No pedal edema, cyanosis, or clubbing.  NEUROLOGIC: Cranial nerves II through XII are intact. Muscle strength 4/5 in all extremities. Sensation intact. Gait not checked.  PSYCHIATRIC: The patient is alert and oriented x 3.  SKIN: No obvious rash, lesion, or ulcer.   Physical Exam LABORATORY PANEL:   CBC Recent Labs  Lab 07/29/18 0645  WBC 6.0  HGB 10.8*  HCT 33.1*  PLT 187   ------------------------------------------------------------------------------------------------------------------  Chemistries  Recent Labs  Lab 07/28/18 1550 07/29/18 0645  NA 136 141  K 6.1* 4.5  CL 103 109  CO2 23 24  GLUCOSE 106* 106*  BUN 28* 21  CREATININE 1.43* 1.13*  CALCIUM 8.6* 8.6*  AST 19  --   ALT 13  --   ALKPHOS 81  --   BILITOT 0.6  --    ------------------------------------------------------------------------------------------------------------------  Cardiac Enzymes Recent Labs  Lab 07/28/18 1550  TROPONINI <0.03   ------------------------------------------------------------------------------------------------------------------  RADIOLOGY:  Dg Chest 1 View  Result Date: 07/28/2018 CLINICAL DATA:  Weakness, shortness of breath. EXAM: CHEST  1 VIEW COMPARISON:  Radiograph of April 11, 2017. FINDINGS: The heart size and mediastinal contours are within normal limits. No pneumothorax or  pleural effusion is noted. Left lung is clear. Stable elevated right hemidiaphragm is noted with associated subsegmental atelectasis. Status post right shoulder arthroplasty. IMPRESSION: Stable elevated right hemidiaphragm with associated right basilar subsegmental atelectasis. Electronically Signed   By: Marijo Conception M.D.   On: 07/28/2018 17:38   Ct Head Wo Contrast  Result Date: 07/28/2018 CLINICAL DATA:  Muscle weakness, bilateral lower extremity pain EXAM: CT HEAD WITHOUT CONTRAST TECHNIQUE: Contiguous axial images were obtained from the base of the skull through the vertex without intravenous contrast. COMPARISON:  12/24/2004 FINDINGS: Brain: No evidence of acute infarction, hemorrhage, extra-axial collection, ventriculomegaly, or mass effect. Generalized cerebral atrophy. Periventricular white matter low attenuation likely secondary to microangiopathy. Vascular: Cerebrovascular atherosclerotic calcifications are noted. Skull: Negative for fracture or focal lesion. Sinuses/Orbits: Visualized portions of the orbits are unremarkable. Visualized portions of the paranasal sinuses and mastoid air cells are unremarkable. Other: None. IMPRESSION: No acute intracranial pathology. Electronically Signed   By: Kathreen Devoid   On: 07/28/2018 20:47   Ct Angio Chest Pe W And/or Wo Contrast  Result Date: 07/28/2018 CLINICAL DATA:  Positive D-dimer, PE suspected EXAM: CT ANGIOGRAPHY CHEST WITH CONTRAST TECHNIQUE: Multidetector CT imaging of the chest was performed using the standard protocol during bolus administration of intravenous contrast. Multiplanar CT image reconstructions and MIPs were obtained to evaluate the vascular anatomy. CONTRAST:  43mL OMNIPAQUE IOHEXOL 350 MG/ML SOLN COMPARISON:  Chest x-ray today.  Chest CT 09/20/2016 FINDINGS: Cardiovascular: Heart is normal size. Aorta is normal caliber. Scattered coronary artery calcifications in the left anterior descending coronary artery. Scattered aortic  calcifications. No filling defects in the pulmonary arteries to suggest pulmonary emboli. Mediastinum/Nodes: Thyroid unremarkable. No mediastinal, hilar, or axillary adenopathy. Trachea and esophagus are unremarkable. Lungs/Pleura: Tree-in-bud gland gas nodular densities in the anterior right upper lobe are stable since 2018, likely postinflammatory scarring. No acute confluent airspace opacities or effusions. Upper Abdomen: Imaging into the upper abdomen shows no acute findings. Postoperative changes within the stomach, likely related to gastric sleeve. Musculoskeletal: Chest wall soft tissues are unremarkable. No acute bony abnormality. Review of the MIP images confirms the above findings. IMPRESSION: No evidence of pulmonary embolus. Punctate scattered left anterior descending coronary calcifications. Aortic Atherosclerosis (ICD10-I70.0). Electronically Signed   By: Rolm Baptise M.D.   On: 07/28/2018 20:47   Dg Foot Complete Left  Result Date: 07/28/2018 CLINICAL DATA:  Left foot ulcer. EXAM: LEFT FOOT - COMPLETE 3+ VIEW COMPARISON:  Radiographs of February 16, 2012. FINDINGS: There is no evidence of fracture or dislocation. Severe degenerative changes seen involving the first metatarsophalangeal joint. Soft tissues are unremarkable. IMPRESSION: Severe osteoarthritis of first metatarsophalangeal joint. No acute abnormality is noted. Electronically Signed   By: Marijo Conception M.D.   On: 07/28/2018 17:41    ASSESSMENT AND PLAN:   Active Problems:   UTI (urinary tract infection)  1.  UTI - started on IV antibiotic therapy with ciprofloxacin given her severe allergy to PCN. - Urine culture is pending.  Will adjust treatment based on results when available. -  received 2 L IV normal saline bolus in the emergency room. -  repeat CBC and BMP in the a.m.  2.  Hyperkalemia - initial potassium 6.1.  Patient received IV insulin and calcium gluconate treatment.  --Normal on repeat.  3.  Left diabetic  foot ulcer -  treat likelihood of underlying cellulitis with IV ciprofloxacin  as patient is already being treated for urinary tract infection with IV antibiotic therapy. -  consult wound care management  4. acute kidney injury - Likely secondary to unresolved urinary tract infection - Patient received IV normal saline bolus on arrival to the emergency room.  _ -- Improvement in the renal function in morning..  5.  Diabetes mellitus - Home management has been continued.  Will monitor with Accu-Cheks and treat with sliding scale insulin  6. acute bronchitis -  ordered IV antibiotic therapy including ciprofloxacin. - Also patient will have Mucinex twice daily as well as PRN DuoNeb treatments.    All the records are reviewed and case discussed with Care Management/Social Workerr. Management plans discussed with the patient, family and they are in agreement.  CODE STATUS: full.  TOTAL TIME TAKING CARE OF THIS PATIENT: 35 minutes.     POSSIBLE D/C IN 1-2 DAYS, DEPENDING ON CLINICAL CONDITION.   Vaughan Basta M.D on 07/29/2018   Between 7am to 6pm - Pager - [phone]  After 6pm go to [email or web address] - password [email or web address] [place]  Office  [phone]  CC: Primary care physician; [name], [name], MD  Note: This dictation was prepared with Dragon dictation along with smaller phrase technology. Any transcriptional errors that result from this process are unintentional.

## 2018-07-29 NOTE — ED Notes (Signed)
Will give other meds once pharm verifies them.

## 2018-07-29 NOTE — Evaluation (Signed)
Physical Therapy Evaluation Patient Details Name: [name] MRN: [id number] DOB: [birth year] Today's Date: 07/29/2018   History of Present Illness  68 y.o. female with a known history of hypertension, diabetes, COPD, as well as DVT to right lower extremity.  She presented to the emergency room complaining of generalized weakness with multiple falls over the last week having most recent mechanical fall earlier in the day with increased pain to bilateral lower extremities.   Clinical Impression  Patient in bed on PT arrival and amenable to evaluation. Nursing applied gauze to LLE to further protect wounds from irritation during mobilization. Patient donned post-op shoe on L with added cushioning from outpatient podiatry to further protect wounds. Patient requiring Mod/Max A from flat bed for bed mobility; with HOB minimally elevated <15 degrees, patient able to complete with Mod A. Patient demonstrates increased fear with transfers including bed mobility and occasionally physically resists assistance. Additionally, patient relies on momentum from BLE during forced trunk flexion to initiate transfer; commensurate with decreased trunk strength. Patient CGA for sitting EOB with intermittent UE support; however initially requires Mod A and all limbs supported to maintain upright posture. Patient Mod A to perform STS from elevated surface and benefits from VCs for hip extensor activation as well as encouragement. Patient able to walk in room for ~40 feet with RW, Min A for safety 2/2 to increased fear of falling. Patient uses RW safely and negotiates turns with care. Patient able to manage undergarments in standing preparation for toileting without UE support, CGA/SBA with no LOB/unsteadiness. Patient does have some unsteadiness with descent onto [facility]. Patient will continue to benefit from skilled therapeutic intervention to address deficits in strength, mobility, balance, and activity tolerance for improved  independence and QOL. Patient will benefit from continued PT services at discharge to decrease her risk of falls.   Patient on [facility] with call bell and phone in reach; nursing informed of patient status.     Follow Up Recommendations Home health PT;Supervision for mobility/OOB    Equipment Recommendations  None recommended by PT(Patient has necessary DME.)    Recommendations for Other Services       Precautions / Restrictions Precautions Precautions: Fall Restrictions Weight Bearing Restrictions: No      Mobility  Bed Mobility Overal bed mobility: Needs Assistance Bed Mobility: Sidelying to Sit;Rolling Rolling: Mod assist Sidelying to sit: Mod assist       General bed mobility comments: Patient uses bed rails, HOB elevated, and requires Mod A and mild encouragement to complete. Patient resists (physically) assistance and demonstrates reliance on BLE momentum to complete.  Transfers Overall transfer level: Needs assistance Equipment used: Rolling walker (2 wheeled) Transfers: Sit to/from Stand Sit to Stand: Mod assist;From elevated surface         General transfer comment: Patient attempted x3 from standard height with Mod A and was unable to complete. Patient able to complete from 2-3" elevated surface with Mod A. Patient has decreased inititation using hip extensors and requires VCs.   Ambulation/Gait Ambulation/Gait assistance: Min assist Gait Distance (Feet): 40 Feet Assistive device: Rolling walker (2 wheeled) Gait Pattern/deviations: Step-to pattern;Decreased stance time - left;Decreased step length - right;Trendelenburg;Decreased stride length   Gait velocity interpretation: <1.31 ft/sec, indicative of household ambulator General Gait Details: Patient has significant gait deficits including Trendelenburg, decreased stance time on LLE, overall decreased stride length. Patient demonstrates safe DME use and requires min VCs.  Stairs            Wheelchair  Mobility    Modified Rankin (Stroke Patients Only)       Balance Overall balance assessment: Needs assistance;History of Falls Sitting-balance support: Single extremity supported;Feet supported Sitting balance-Leahy Scale: Fair Sitting balance - Comments: Patient requires external support to maintain sitting initially. Patient is able to sustain the position with decreased support once in the position; likely requires increased support initially 2/2 to fear of falling. Postural control: Posterior lean Standing balance support: During functional activity;No upper extremity supported Standing balance-Leahy Scale: Good Standing balance comment: Patient able to stand without UE support, SBA/CGA and manage undergarments for toileting.                              Pertinent Vitals/Pain Pain Assessment: Faces Faces Pain Scale: Hurts a little bit Pain Location: LLE Pain Intervention(s): Limited activity within patient's tolerance;Repositioned;Monitored during session    [place] expects to be discharged to:: Private residence Living Arrangements: Spouse/significant other Available Help at Discharge: Family;Available 24 hours/day Type of Home: House Home Access: Stairs to enter Entrance Stairs-Rails: Can reach both Entrance Stairs-Number of Steps: 4 Home Layout: One level Home Equipment: Walker - 2 wheels;Walker - 4 wheels;Shower seat;Bedside commode      Prior Function Level of Independence: Needs assistance         Comments: Patient relies on help from spouse for tasks around the house; has had multiple falls in the past 12 months. Patient uses an AD for mobility.     Hand Dominance   Dominant Hand: Right    Extremity/Trunk Assessment   Upper Extremity Assessment Upper Extremity Assessment: Generalized weakness;Overall [facility] for tasks assessed    Lower Extremity Assessment Lower Extremity Assessment: Generalized weakness;Overall WFL for  tasks assessed       Communication   Communication: No difficulties  Cognition Arousal/Alertness: Awake/alert Behavior During Therapy: WFL for tasks assessed/performed Overall Cognitive Status: Within Functional Limits for tasks assessed                                        General Comments General comments (skin integrity, edema, etc.): LLE several wounds (plantar surface of calcaneus, posterior aspect of calf)    Exercises     Assessment/Plan    PT Assessment Patient needs continued PT services  PT Problem List Decreased strength;Decreased coordination;Obesity;Decreased skin integrity;Decreased mobility;Decreased balance;Decreased activity tolerance       PT Treatment Interventions Therapeutic exercise;Gait training;Balance training;Stair training;Neuromuscular re-education;Functional mobility training;Therapeutic activities;Patient/family education    PT Goals (Current goals can be found in the Care Plan section)  Acute Rehab PT Goals Patient Stated Goal: go home PT Goal Formulation: With patient Time For Goal Achievement: 08/12/18 Potential to Achieve Goals: Fair    Frequency Min 2X/week   Barriers to discharge        Co-evaluation               AM-PAC PT "6 Clicks" Mobility  Outcome Measure Help needed turning from your back to your side while in a flat bed without using bedrails?: A Lot Help needed moving from lying on your back to sitting on the side of a flat bed without using bedrails?: A Lot Help needed moving to and from a bed to a chair (including a wheelchair)?: A Little Help needed standing up from a chair using your arms (e.g., wheelchair or bedside chair)?:  A Little Help needed to walk in hospital room?: A Little Help needed climbing 3-5 steps with a railing? : A Lot 6 Click Score: 15    End of Session Equipment Utilized During Treatment: Gait belt;Other (comment) Activity Tolerance: Patient tolerated treatment  well Patient left: Other (comment);with call bell/phone within reach(BSC) Nurse Communication: Mobility status PT Visit Diagnosis: Unsteadiness on feet (R26.81);Repeated falls (R29.6);Muscle weakness (generalized) (M62.81)    Time: 2330-0762 PT Time Calculation (min) (ACUTE ONLY): 40 min   Charges:   PT Evaluation $PT Eval Moderate Complexity: 1 Mod PT Treatments $Gait Training: 8-22 mins       Myles Gip PT, DPT [phone] 07/29/2018, 4:36 PM

## 2018-07-30 LAB — HIV ANTIBODY (ROUTINE TESTING W REFLEX): HIV Screen 4th Generation wRfx: NONREACTIVE

## 2018-07-30 LAB — GLUCOSE, CAPILLARY: Glucose-Capillary: 117 mg/dL — ABNORMAL HIGH (ref 70–99)

## 2018-07-30 MED ORDER — FLUCONAZOLE 100 MG PO TABS: 100.0000 mg | ORAL_TABLET | Freq: Every day | ORAL | 0 refills | Status: DC

## 2018-07-30 MED ORDER — FOSFOMYCIN TROMETHAMINE 3 G PO PACK
3.0000 g | PACK | Freq: Once | ORAL | Status: AC
  Administered 2018-07-30: 3 g via ORAL
  Filled 2018-07-30: qty 3

## 2018-07-30 MED ORDER — FOSFOMYCIN TROMETHAMINE 3 G PO PACK: 3.0000 g | PACK | Freq: Once | ORAL | 0 refills | Status: AC

## 2018-07-30 MED ORDER — ALBUTEROL SULFATE (2.5 MG/3ML) 0.083% IN NEBU: 2.5000 mg | INHALATION_SOLUTION | RESPIRATORY_TRACT | Status: DC | PRN

## 2018-07-30 MED ORDER — IPRATROPIUM-ALBUTEROL 0.5-2.5 (3) MG/3ML IN SOLN: 3.0000 mL | Freq: Three times a day (TID) | RESPIRATORY_TRACT | Status: DC

## 2018-07-30 NOTE — Progress Notes (Signed)
[name] to be D/C'd per MD order. Discussed with the patient and all questions fully answered. ? VSS, Skin clean, dry and intact without evidence of skin break down. ? IV catheter discontinued intact. Site without signs and symptoms of complications. Dressing and pressure applied. ? An After Visit Summary was printed and given to the patient. Patient informed where to pickup prescriptions. ? D/c education completed with patient/family including follow up instructions, medication list, d/c activities limitations if indicated, with other d/c instructions as indicated by MD - patient able to verbalize understanding, all questions fully answered.  ? Patient instructed to return to ED, call 911, or call MD for any changes in condition.  ? Patient to be escorted via [place], and D/C home via private auto.

## 2018-07-30 NOTE — Discharge Summary (Addendum)
[facility] NAME: [name]    MR#:  [id number]  DATE OF BIRTH:  1950/11/05  DATE OF ADMISSION:  07/28/2018 ADMITTING PHYSICIAN: Christel Mormon, MD  DATE OF DISCHARGE: 07/30/2018  PRIMARY CARE PHYSICIAN: [name], [name], MD    ADMISSION DIAGNOSIS:  Acute hyperkalemia [E87.5] Moderate persistent asthma with exacerbation [J45.41] Acute cystitis with hematuria [N30.01] Fall, initial encounter [W19.XXXA]  DISCHARGE DIAGNOSIS:  Active Problems:   UTI (urinary tract infection)   SECONDARY DIAGNOSIS:   Past Medical History:  Diagnosis Date  . Abnormal antibody titer   . Anxiety and depression   . Arthritis   . Asthma   . Cystocele   . Depression   . Diabetes ([place])   . DVT (deep venous thrombosis) (HCC)    Righ calf  . Dysrhythmia   . Edema   . GERD (gastroesophageal reflux disease)   . Heart murmur    Echocardiogram June 2015: Mild MR (possible vegetation seen on TTE, not seen on TEE), normal LV size with moderate concentric LVH. Normal function EF 60-65%. Normal diastolic function. Mild LA dilation.  Marland Kitchen History of IBS   . History of methicillin resistant staphylococcus aureus (MRSA) 2008  . Hyperlipidemia   . Hypertension   . Hypothyroidism   . Insomnia   . Kidney stone    kidney stones with lithotripsy  . Neuropathy involving both lower extremities   . Obesity, Class II, BMI 35-39.9, with comorbidity   . Paroxysmal atrial fibrillation ([place]) 2007   In June 2015, Cardiac Event Monitor: Mostly SR/sinus arrhythmia with PVCs that are frequent. Short bursts of A. fib lasting several minutes;; CHA2DS2-VASc Score = 5 (age, Female, PVD, DM, HTN)  . Peripheral vascular disease ([place])   . Sleep apnea    use C-PAP  . Urinary incontinence   . Venous stasis dermatitis of both lower extremities     HOSPITAL COURSE:    68 year old female with history of diabetes and essential hypertension who presented to the ER due to  generalized weakness with multiple falls.  1.  Generalized weakness due to urinary tract infection: Patient's urine culture taken at her PCPs office on May 5 was positive for E. coli which was resistant to several antibiotics.  She received a dose of Fosfomycin.  She will receive another dose in 3 days.  2.  Essential hypertension: Patient will continue on Norvasc and metoprolol  3.  Diabetes: Patient continue ADA diet and outpatient regimen.  4.  Depression: Patient will continue Wellbutrin and Cymbalta  5 Hypothyroid: Continue Synthroid  DISCHARGE CONDITIONS AND DIET:   Stable Heart healthy diabetic diet  CONSULTS OBTAINED:    DRUG ALLERGIES:   Allergies  Allergen Reactions  . Penicillins Hives, Shortness Of Breath, Swelling and Other (See Comments)    Facial swelling Has patient had a PCN reaction causing immediate rash, facial/tongue/throat swelling, SOB or lightheadedness with hypotension: Yes Has patient had a PCN reaction causing severe rash involving mucus membranes or skin necrosis: Yes Has patient had a PCN reaction that required hospitalization No Has patient had a PCN reaction occurring within the last 10 years: No If all of the above answers are "NO", then may proceed with Cephalosporin use.   . Aspirin Hives  . Morphine And Related Other (See Comments)    Patient becomes very confused  . Terramycin [Oxytetracycline] Hives    DISCHARGE MEDICATIONS:   Allergies as of 07/30/2018      Reactions  Penicillins Hives, Shortness Of Breath, Swelling, Other (See Comments)   Facial swelling Has patient had a PCN reaction causing immediate rash, facial/tongue/throat swelling, SOB or lightheadedness with hypotension: Yes Has patient had a PCN reaction causing severe rash involving mucus membranes or skin necrosis: Yes Has patient had a PCN reaction that required hospitalization No Has patient had a PCN reaction occurring within the last 10 years: No If all of the above  answers are "NO", then may proceed with Cephalosporin use.   Aspirin Hives   Morphine And Related Other (See Comments)   Patient becomes very confused   Terramycin [oxytetracycline] Hives      Medication List    TAKE these medications   albuterol (2.5 MG/3ML) 0.083% nebulizer solution Commonly known as:  PROVENTIL Take 3 mLs (2.5 mg total) by nebulization every 6 (six) hours as needed for wheezing or shortness of breath. What changed:  Another medication with the same name was changed. Make sure you understand how and when to take each.   albuterol 108 (90 Base) MCG/ACT inhaler Commonly known as:  VENTOLIN HFA Inhale 1-2 puffs into the lungs every 4 (four) hours as needed for wheezing or shortness of breath. What changed:  how much to take   ALPRAZolam 0.5 MG tablet Commonly known as:  XANAX Take 1 tablet (0.5 mg total) by mouth 2 (two) times daily as needed for anxiety.   amLODipine 5 MG tablet Commonly known as:  NORVASC Take 1 tablet (5 mg total) by mouth daily.   buPROPion 300 MG 24 hr tablet Commonly known as:  WELLBUTRIN XL Take 1 tablet (300 mg total) by mouth daily.   CALCIUM + D PO Take 1 tablet by mouth 2 (two) times daily.   clobetasol cream 0.05 % Commonly known as:  TEMOVATE Apply 1 application topically 2 (two) times daily. To arms What changed:    when to take this  reasons to take this   dabigatran 150 MG Caps capsule Commonly known as:  Pradaxa Take 1 capsule (150 mg total) by mouth 2 (two) times daily.   Dexlansoprazole 30 MG capsule Commonly known as:  Dexilant Take 1 capsule (30 mg total) by mouth daily. Note lower maintenance dose take 30 min before food lunch or dinner not with thyroid medication Notes to patient:  Given protonix 07/30/2018. May wait until 07/31/2018   DULoxetine 60 MG capsule Commonly known as:  CYMBALTA Take 1 capsule (60 mg total) by mouth daily.   estradiol 0.1 MG/GM vaginal cream Commonly known as:  ESTRACE Apply  0.5mg  (pea-sized amount)  just inside the vaginal introitus with a finger-tip on Monday, Wednesday and Friday nights,   fesoterodine 8 MG Tb24 tablet Commonly known as:  TOVIAZ Take 1 tablet (8 mg total) by mouth daily.   fluconazole 100 MG tablet Commonly known as:  Diflucan Take 1 tablet (100 mg total) by mouth daily for 4 days.   fluticasone-salmeterol 115-21 MCG/ACT inhaler Commonly known as:  Advair HFA Inhale 2 puffs into the lungs daily. Rinse mouth thoroughly after use   fosfomycin 3 g Pack Commonly known as:  MONUROL Take 3 g by mouth once for 1 dose. Take on Tuesday only   hydrochlorothiazide 12.5 MG tablet Commonly known as:  HYDRODIURIL Take 1 tablet (12.5 mg total) by mouth daily. In am   HYDROcodone-acetaminophen 5-325 MG tablet Commonly known as:  NORCO/VICODIN Take 1 tablet by mouth every 8 (eight) hours as needed for up to 30 days for severe pain.  HYDROcodone-acetaminophen 5-325 MG tablet Commonly known as:  NORCO/VICODIN Take 1 tablet by mouth every 8 (eight) hours as needed for up to 30 days for severe pain. Start taking on:  Aug 07, 2018   levocetirizine 5 MG tablet Commonly known as:  XYZAL TAKE 1 TABLET BY MOUTH EVERY EVENING   levothyroxine 175 MCG tablet Commonly known as:  SYNTHROID Take 1 tablet (175 mcg total) by mouth daily before breakfast.   losartan 50 MG tablet Commonly known as:  COZAAR Take 1 tablet (50 mg total) by mouth daily. Do not take quinapril 40   metFORMIN 1000 MG tablet Commonly known as:  GLUCOPHAGE Take 1 tablet (1,000 mg total) by mouth 2 (two) times daily. With food   metoprolol succinate 50 MG 24 hr tablet Commonly known as:  TOPROL-XL Take 1 tablet (50 mg total) by mouth daily. Take with or immediately following a meal.   montelukast 10 MG tablet Commonly known as:  SINGULAIR Take 1 tablet (10 mg total) by mouth daily.   mupirocin ointment 2 % Commonly known as:  Bactroban Apply 1 application topically 2  (two) times daily. Skin wounds   nitrofurantoin (macrocrystal-monohydrate) 100 MG capsule Commonly known as:  MACROBID Take 1 capsule (100 mg total) by mouth every 12 (twelve) hours.   nystatin cream Commonly known as:  MYCOSTATIN Apply 1 application topically 2 (two) times daily. Mouth prn for 7 days as needed What changed:    when to take this  reasons to take this   ondansetron 4 MG tablet Commonly known as:  Zofran Take 1 tablet (4 mg total) by mouth every 8 (eight) hours as needed for nausea or vomiting.   oxybutynin 15 MG 24 hr tablet Commonly known as:  DITROPAN XL TAKE ONE TABLET BY MOUTH AT BEDTIME   pregabalin 25 MG capsule Commonly known as:  Lyrica Take 1 capsule (25 mg total) by mouth 3 (three) times daily.   Voltaren 1 % Gel Generic drug:  diclofenac sodium Apply 1 application topically daily as needed (pain).   zinc oxide 20 % ointment Apply 1 application topically as needed for irritation.         Today   CHIEF COMPLAINT:   Patient doing well this morning.  No dysuria or frequency or urgency.  No abdominal pain.   VITAL SIGNS:  Blood pressure 131/72, pulse 65, temperature 98.4 F (36.9 C), temperature source Oral, resp. rate 18, height 5\' 2"  (1.575 m), weight 113.4 kg, SpO2 95 %.   REVIEW OF SYSTEMS:  Review of Systems  Constitutional: Negative.  Negative for chills, fever and malaise/fatigue.  HENT: Negative.  Negative for ear discharge, ear pain, hearing loss, nosebleeds and sore throat.   Eyes: Negative.  Negative for blurred vision and pain.  Respiratory: Negative.  Negative for cough, hemoptysis, shortness of breath and wheezing.   Cardiovascular: Negative.  Negative for chest pain, palpitations and leg swelling.  Gastrointestinal: Negative.  Negative for abdominal pain, blood in stool, diarrhea, nausea and vomiting.  Genitourinary: Negative.  Negative for dysuria.  Musculoskeletal: Negative.  Negative for back pain.  Skin: Negative.    Neurological: Negative for dizziness, tremors, speech change, focal weakness, seizures and headaches.  Endo/Heme/Allergies: Negative.  Does not bruise/bleed easily.  Psychiatric/Behavioral: Negative.  Negative for depression, hallucinations and suicidal ideas.     PHYSICAL EXAMINATION:  GENERAL:  68 y.o.-year-old patient lying in the bed with no acute distress.  NECK:  Supple, no jugular venous distention. No thyroid enlargement, no  tenderness.  LUNGS: Normal breath sounds bilaterally, no wheezing, rales,rhonchi  No use of accessory muscles of respiration.  CARDIOVASCULAR: S1, S2 normal. No murmurs, rubs, or gallops.  ABDOMEN: Soft, non-tender, non-distended. Bowel sounds present. No organomegaly or mass.  EXTREMITIES: No pedal edema, cyanosis, or clubbing.  PSYCHIATRIC: The patient is alert and oriented x 3.  SKIN: No obvious rash, lesion, or ulcer.   DATA REVIEW:   CBC Recent Labs  Lab 07/29/18 0645  WBC 6.0  HGB 10.8*  HCT 33.1*  PLT 187    Chemistries  Recent Labs  Lab 07/28/18 1550 07/29/18 0645  NA 136 141  K 6.1* 4.5  CL 103 109  CO2 23 24  GLUCOSE 106* 106*  BUN 28* 21  CREATININE 1.43* 1.13*  CALCIUM 8.6* 8.6*  AST 19  --   ALT 13  --   ALKPHOS 81  --   BILITOT 0.6  --     Cardiac Enzymes Recent Labs  Lab 07/28/18 1550  TROPONINI <0.03    Microbiology Results  @[email or web address] @  RADIOLOGY:  Dg Chest 1 View  Result Date: 07/28/2018 CLINICAL DATA:  Weakness, shortness of breath. EXAM: CHEST  1 VIEW COMPARISON:  Radiograph of April 11, 2017. FINDINGS: The heart size and mediastinal contours are within normal limits. No pneumothorax or pleural effusion is noted. Left lung is clear. Stable elevated right hemidiaphragm is noted with associated subsegmental atelectasis. Status post right shoulder arthroplasty. IMPRESSION: Stable elevated right hemidiaphragm with associated right basilar subsegmental atelectasis. Electronically Signed   By: Marijo Conception  M.D.   On: 07/28/2018 17:38   Ct Head Wo Contrast  Result Date: 07/28/2018 CLINICAL DATA:  Muscle weakness, bilateral lower extremity pain EXAM: CT HEAD WITHOUT CONTRAST TECHNIQUE: Contiguous axial images were obtained from the base of the skull through the vertex without intravenous contrast. COMPARISON:  12/24/2004 FINDINGS: Brain: No evidence of acute infarction, hemorrhage, extra-axial collection, ventriculomegaly, or mass effect. Generalized cerebral atrophy. Periventricular white matter low attenuation likely secondary to microangiopathy. Vascular: Cerebrovascular atherosclerotic calcifications are noted. Skull: Negative for fracture or focal lesion. Sinuses/Orbits: Visualized portions of the orbits are unremarkable. Visualized portions of the paranasal sinuses and mastoid air cells are unremarkable. Other: None. IMPRESSION: No acute intracranial pathology. Electronically Signed   By: Kathreen Devoid   On: 07/28/2018 20:47   Ct Angio Chest Pe W And/or Wo Contrast  Result Date: 07/28/2018 CLINICAL DATA:  Positive D-dimer, PE suspected EXAM: CT ANGIOGRAPHY CHEST WITH CONTRAST TECHNIQUE: Multidetector CT imaging of the chest was performed using the standard protocol during bolus administration of intravenous contrast. Multiplanar CT image reconstructions and MIPs were obtained to evaluate the vascular anatomy. CONTRAST:  21mL OMNIPAQUE IOHEXOL 350 MG/ML SOLN COMPARISON:  Chest x-ray today.  Chest CT 09/20/2016 FINDINGS: Cardiovascular: Heart is normal size. Aorta is normal caliber. Scattered coronary artery calcifications in the left anterior descending coronary artery. Scattered aortic calcifications. No filling defects in the pulmonary arteries to suggest pulmonary emboli. Mediastinum/Nodes: Thyroid unremarkable. No mediastinal, hilar, or axillary adenopathy. Trachea and esophagus are unremarkable. Lungs/Pleura: Tree-in-bud gland gas nodular densities in the anterior right upper lobe are stable since 2018,  likely postinflammatory scarring. No acute confluent airspace opacities or effusions. Upper Abdomen: Imaging into the upper abdomen shows no acute findings. Postoperative changes within the stomach, likely related to gastric sleeve. Musculoskeletal: Chest wall soft tissues are unremarkable. No acute bony abnormality. Review of the MIP images confirms the above findings. IMPRESSION: No evidence of pulmonary embolus. Punctate scattered  left anterior descending coronary calcifications. Aortic Atherosclerosis (ICD10-I70.0). Electronically Signed   By: Rolm Baptise M.D.   On: 07/28/2018 20:47   Dg Foot Complete Left  Result Date: 07/28/2018 CLINICAL DATA:  Left foot ulcer. EXAM: LEFT FOOT - COMPLETE 3+ VIEW COMPARISON:  Radiographs of February 16, 2012. FINDINGS: There is no evidence of fracture or dislocation. Severe degenerative changes seen involving the first metatarsophalangeal joint. Soft tissues are unremarkable. IMPRESSION: Severe osteoarthritis of first metatarsophalangeal joint. No acute abnormality is noted. Electronically Signed   By: Marijo Conception M.D.   On: 07/28/2018 17:41      Allergies as of 07/30/2018      Reactions   Penicillins Hives, Shortness Of Breath, Swelling, Other (See Comments)   Facial swelling Has patient had a PCN reaction causing immediate rash, facial/tongue/throat swelling, SOB or lightheadedness with hypotension: Yes Has patient had a PCN reaction causing severe rash involving mucus membranes or skin necrosis: Yes Has patient had a PCN reaction that required hospitalization No Has patient had a PCN reaction occurring within the last 10 years: No If all of the above answers are "NO", then may proceed with Cephalosporin use.   Aspirin Hives   Morphine And Related Other (See Comments)   Patient becomes very confused   Terramycin [oxytetracycline] Hives      Medication List    TAKE these medications   albuterol (2.5 MG/3ML) 0.083% nebulizer solution Commonly known  as:  PROVENTIL Take 3 mLs (2.5 mg total) by nebulization every 6 (six) hours as needed for wheezing or shortness of breath. What changed:  Another medication with the same name was changed. Make sure you understand how and when to take each.   albuterol 108 (90 Base) MCG/ACT inhaler Commonly known as:  VENTOLIN HFA Inhale 1-2 puffs into the lungs every 4 (four) hours as needed for wheezing or shortness of breath. What changed:  how much to take   ALPRAZolam 0.5 MG tablet Commonly known as:  XANAX Take 1 tablet (0.5 mg total) by mouth 2 (two) times daily as needed for anxiety.   amLODipine 5 MG tablet Commonly known as:  NORVASC Take 1 tablet (5 mg total) by mouth daily.   buPROPion 300 MG 24 hr tablet Commonly known as:  WELLBUTRIN XL Take 1 tablet (300 mg total) by mouth daily.   CALCIUM + D PO Take 1 tablet by mouth 2 (two) times daily.   clobetasol cream 0.05 % Commonly known as:  TEMOVATE Apply 1 application topically 2 (two) times daily. To arms What changed:    when to take this  reasons to take this   dabigatran 150 MG Caps capsule Commonly known as:  Pradaxa Take 1 capsule (150 mg total) by mouth 2 (two) times daily.   Dexlansoprazole 30 MG capsule Commonly known as:  Dexilant Take 1 capsule (30 mg total) by mouth daily. Note lower maintenance dose take 30 min before food lunch or dinner not with thyroid medication Notes to patient:  Given protonix 07/30/2018. May wait until 07/31/2018   DULoxetine 60 MG capsule Commonly known as:  CYMBALTA Take 1 capsule (60 mg total) by mouth daily.   estradiol 0.1 MG/GM vaginal cream Commonly known as:  ESTRACE Apply 0.5mg  (pea-sized amount)  just inside the vaginal introitus with a finger-tip on Monday, Wednesday and Friday nights,   fesoterodine 8 MG Tb24 tablet Commonly known as:  TOVIAZ Take 1 tablet (8 mg total) by mouth daily.   fluconazole 100 MG tablet  Commonly known as:  Diflucan Take 1 tablet (100 mg total)  by mouth daily for 4 days.   fluticasone-salmeterol 115-21 MCG/ACT inhaler Commonly known as:  Advair HFA Inhale 2 puffs into the lungs daily. Rinse mouth thoroughly after use   fosfomycin 3 g Pack Commonly known as:  MONUROL Take 3 g by mouth once for 1 dose. Take on Tuesday only   hydrochlorothiazide 12.5 MG tablet Commonly known as:  HYDRODIURIL Take 1 tablet (12.5 mg total) by mouth daily. In am   HYDROcodone-acetaminophen 5-325 MG tablet Commonly known as:  NORCO/VICODIN Take 1 tablet by mouth every 8 (eight) hours as needed for up to 30 days for severe pain.   HYDROcodone-acetaminophen 5-325 MG tablet Commonly known as:  NORCO/VICODIN Take 1 tablet by mouth every 8 (eight) hours as needed for up to 30 days for severe pain. Start taking on:  Aug 07, 2018   levocetirizine 5 MG tablet Commonly known as:  XYZAL TAKE 1 TABLET BY MOUTH EVERY EVENING   levothyroxine 175 MCG tablet Commonly known as:  SYNTHROID Take 1 tablet (175 mcg total) by mouth daily before breakfast.   losartan 50 MG tablet Commonly known as:  COZAAR Take 1 tablet (50 mg total) by mouth daily. Do not take quinapril 40   metFORMIN 1000 MG tablet Commonly known as:  GLUCOPHAGE Take 1 tablet (1,000 mg total) by mouth 2 (two) times daily. With food   metoprolol succinate 50 MG 24 hr tablet Commonly known as:  TOPROL-XL Take 1 tablet (50 mg total) by mouth daily. Take with or immediately following a meal.   montelukast 10 MG tablet Commonly known as:  SINGULAIR Take 1 tablet (10 mg total) by mouth daily.   mupirocin ointment 2 % Commonly known as:  Bactroban Apply 1 application topically 2 (two) times daily. Skin wounds   nitrofurantoin (macrocrystal-monohydrate) 100 MG capsule Commonly known as:  MACROBID Take 1 capsule (100 mg total) by mouth every 12 (twelve) hours.   nystatin cream Commonly known as:  MYCOSTATIN Apply 1 application topically 2 (two) times daily. Mouth prn for 7 days as  needed What changed:    when to take this  reasons to take this   ondansetron 4 MG tablet Commonly known as:  Zofran Take 1 tablet (4 mg total) by mouth every 8 (eight) hours as needed for nausea or vomiting.   oxybutynin 15 MG 24 hr tablet Commonly known as:  DITROPAN XL TAKE ONE TABLET BY MOUTH AT BEDTIME   pregabalin 25 MG capsule Commonly known as:  Lyrica Take 1 capsule (25 mg total) by mouth 3 (three) times daily.   Voltaren 1 % Gel Generic drug:  diclofenac sodium Apply 1 application topically daily as needed (pain).   zinc oxide 20 % ointment Apply 1 application topically as needed for irritation.          Management plans discussed with the patient and she is in agreement. Stable for discharge home with [facility]hc  Patient should follow up with pcp  CODE STATUS:     Code Status Orders  (From admission, onward)         Start     Ordered   07/29/18 0055  Full code  Continuous     07/29/18 0054        Code Status History    Date Active Date Inactive Code Status Order ID Comments User Context   04/04/2018 1010 04/04/2018 1526 Full Code [id number]  [name], MD  Inpatient   09/25/2016 0916 09/26/2016 2106 Full Code [id number] Full code with ACLS Protocol WITH NO CHEST COMPRESSIONS. Ventricular Assist Device in place. [name], [facility] Inpatient   09/25/2016 3536 09/25/2016 0916 Full Code [id number] Full code with ACLS Protocol WITH NO CHEST COMPRESSIONS. Ventricular Assist Device in place. [name], [facility] Inpatient   09/25/2016 0841 09/25/2016 0841 Partial Code [id number] Ventricular Assist Device in place.  No Chest Compressions. [name], [facility] Inpatient   09/05/2016 1628 09/09/2016 2215 Full Code [id number]  Quintella Baton, MD ED   08/06/2016 2021 08/07/2016 1727 Full Code [id number]  Poggi, Marshall Cork, MD Inpatient   08/06/2016 1403 08/06/2016 2021 Full Code [id number]  Poggi, Marshall Cork, MD Inpatient   10/08/2015 1206 10/13/2015 1734 Full Code [id number]  Poggi, Marshall Cork, MD  Inpatient    Advance Directive Documentation     Most Recent Value  Type of Advance Directive  Healthcare Power of Attorney, Living will  Pre-existing out of facility DNR order (yellow form or pink MOST form)  -  "MOST" Form in Place?  -      TOTAL TIME TAKING CARE OF THIS PATIENT: 37 minutes.    Note: This dictation was prepared with Dragon dictation along with smaller phrase technology. Any transcriptional errors that result from this process are unintentional.  Bettey Costa M.D on 07/30/2018 at 10:37 AM  Between 7am to 6pm - Pager - [phone] After 6pm go to [email or web address] - password [email or web address] [facility]  Office  [phone]  CC: Primary care physician; [name], [name], MD

## 2018-07-30 NOTE — TOC Progression Note (Signed)
Transition of Care [facility]) - Progression Note    Patient Details  Name: Analicia Skibinski MRN: [id number] Date of Birth: [birth year]  [facility] CM/SW Contact  Latanya Maudlin, RN Phone Number: 07/30/2018, 9:46 AM  Clinical Narrative:  Patient to be discharged per MD order. Orders in place for home health services. Patient agreeable to home health. CMS Medicare.gov Compare Post Acute Care list reviewed with patient and she has used [facility] in the past, unfortunately Cassie with [facility] tells me the patients insurance is OON. Referral placed with Malachy Mood from [facility]. Patient has rollator, rolling walker and bedside commode available in the home. Spouse to transport.     Expected Discharge Plan: [place] Barriers to Discharge: No Barriers Identified  Expected Discharge Plan and Services Expected Discharge Plan: [place]         Expected Discharge Date: 07/30/18                         HH Arranged: PT, Nurse's Aide HH Agency: [place] Date [facility] Agency Contacted: 07/30/18 Time [place]: [phone] Representative spoke with at [place]: cheryl   Social Determinants of Health ([place]) Interventions    Readmission Risk Interventions Readmission Risk Prevention Plan 07/30/2018  Transportation Screening Complete  PCP or Specialist Appt within 3-5 Days Complete  HRI or [place] Complete  Social Work Consult for [place] Planning/Counseling Patient refused  Palliative Care Screening Patient Refused  Medication Review Press photographer) Complete  Some recent data might be hidden

## 2018-07-31 LAB — URINE CULTURE: Culture: >=100000 — AB

## 2018-08-01 ENCOUNTER — Telehealth: Payer: Self-pay

## 2018-08-01 ENCOUNTER — Telehealth: Payer: Self-pay | Admitting: Internal Medicine

## 2018-08-01 LAB — URINE CULTURE: Culture: 60000 — AB

## 2018-08-01 NOTE — Telephone Encounter (Signed)
[name] calling back saying his wife is not retaining information. She will ask the same question hours apart and he believes there is a neurological problem or her medications may be causing this.

## 2018-08-01 NOTE — Telephone Encounter (Signed)
Copied from [place] [phone]. Topic: Quick Communication - See Telephone Encounter >> Aug 01, 2018 10:09 AM Blase Mess A wrote: CRM for notification. See Telephone encounter for: 08/01/18. Patient is calling to speak to [facility]. The patient is doing some strange things. She is falling 3 times in a week. The main concern is she is forgetting lots of things.  At night time, her legs daggle off the bed unsure if it is restless leg symdrome. Please advise with Mr. Roma with his concerns. [phone]

## 2018-08-01 NOTE — Telephone Encounter (Signed)
Called pt no answer. Unable to leave message as no voicemail answers.

## 2018-08-01 NOTE — Telephone Encounter (Signed)
-----   Message from Nori Riis, PA-C sent at 07/29/2018  5:14 PM EDT ----- Please call Mrs. Schnelle and make sure she went to [facility] over the weekend and got her Macrobid prescription.

## 2018-08-02 ENCOUNTER — Telehealth: Payer: Self-pay | Admitting: Internal Medicine

## 2018-08-02 ENCOUNTER — Other Ambulatory Visit: Payer: Self-pay | Admitting: Gastroenterology

## 2018-08-02 ENCOUNTER — Other Ambulatory Visit: Payer: Self-pay

## 2018-08-02 ENCOUNTER — Other Ambulatory Visit: Payer: Self-pay | Admitting: Urology

## 2018-08-02 ENCOUNTER — Other Ambulatory Visit: Payer: Self-pay | Admitting: Internal Medicine

## 2018-08-02 DIAGNOSIS — F419 Anxiety disorder, unspecified: Secondary | ICD-10-CM

## 2018-08-02 DIAGNOSIS — E875 Hyperkalemia: Secondary | ICD-10-CM | POA: Diagnosis not present

## 2018-08-02 DIAGNOSIS — R1310 Dysphagia, unspecified: Secondary | ICD-10-CM | POA: Diagnosis not present

## 2018-08-02 DIAGNOSIS — R809 Proteinuria, unspecified: Secondary | ICD-10-CM | POA: Diagnosis not present

## 2018-08-02 DIAGNOSIS — G4733 Obstructive sleep apnea (adult) (pediatric): Secondary | ICD-10-CM | POA: Diagnosis not present

## 2018-08-02 DIAGNOSIS — R1013 Epigastric pain: Secondary | ICD-10-CM | POA: Diagnosis not present

## 2018-08-02 DIAGNOSIS — D509 Iron deficiency anemia, unspecified: Secondary | ICD-10-CM | POA: Diagnosis not present

## 2018-08-02 DIAGNOSIS — N183 Chronic kidney disease, stage 3 (moderate): Secondary | ICD-10-CM | POA: Diagnosis not present

## 2018-08-02 DIAGNOSIS — D369 Benign neoplasm, unspecified site: Secondary | ICD-10-CM | POA: Diagnosis not present

## 2018-08-02 DIAGNOSIS — F329 Major depressive disorder, single episode, unspecified: Secondary | ICD-10-CM

## 2018-08-02 DIAGNOSIS — N390 Urinary tract infection, site not specified: Secondary | ICD-10-CM

## 2018-08-02 LAB — CULTURE, BLOOD (ROUTINE X 2)
Culture: NO GROWTH
Culture: NO GROWTH
Special Requests: ADEQUATE

## 2018-08-02 MED ORDER — BUPROPION HCL ER (XL) 300 MG PO TB24: 300.0000 mg | ORAL_TABLET | Freq: Every day | ORAL | 11 refills | Status: DC

## 2018-08-02 NOTE — Progress Notes (Unsigned)
FYI:  I have put in the referral for ID for rUTI's.

## 2018-08-02 NOTE — Telephone Encounter (Signed)
p pecCopied from [place] [phone]. Topic: Quick Communication - Rx Refill/Question >> Aug 02, 2018  9:52 AM Rayann Heman wrote: fosfomycin (MONUROL) packet 3 g   [[id number]] pt states that this medication was prescribed at the hospital but needs a PA

## 2018-08-02 NOTE — Telephone Encounter (Signed)
Copied from [place] [phone]. Topic: Appointment Scheduling - Scheduling Inquiry for Clinic >> Aug 02, 2018  9:59 AM Rayann Heman wrote: Reason for CRM:  station unavailable. Pt needs to schedule a 1 week hospital follow up. Please advise

## 2018-08-02 NOTE — Telephone Encounter (Signed)
Called pt since falling 3x w/in the last 1-2 weeks c/o left thigh pain and feeling unsteady weak with walking having home PT/OT at home  Reviewed Xrays with arthritis in back and right knee she declines Xray of left thigh and low back for now   She needs PA for fosfomycin 2nd dose due to cost >$100 now and with PA $8 [facility] to do this since they Rx   She will call back to schedule hospital f/u   [place]

## 2018-08-02 NOTE — Patient Outreach (Signed)
[facility]) Care Management  08/02/2018  [name] 12/16/1950 [id number]  EMMI: general discharge red alert Referral date: 08/02/18 Referral reason: scheduled a follow up visit:  No Insurance:  Health team advantage Day # 1  Telephone call to patient regarding EMMI general discharge red alert. HIPAA verified with patient. RNCM introduced herself and explained reason for call.  Patient states she has not scheduled a follow up visit yet but would call today to schedule.  Patient states she is feeling much better.  She states her body is a little sore from the falls she had prior to being hospitalized. Patient reports she fell three times the week of being admitted. She states this was due to her high potassium levels based on what she was told when she was in the hospital. Patient denies any falls since she has been home. Patient reports she has all of her medications and takes them as prescribed. She states she has transportation to her appointments.  Patient denies any new symptoms or concerns at this time.  RNCM offered to give patient the 24 hour nurse advise line number. Patient states she already has this number on a magnet on her refrigerator.  RNCM discussed COVID 19 precautions and symptoms. Advised patient to contact her doctor for minor symptoms. If symptoms more severe call 911.  Patient verbalized understanding.    PLAN:  RNCM will close patient due to patient being assessed and having no further needs.   Quinn Plowman RN,BSN,CCM [facility] Telephonic  [phone]

## 2018-08-02 NOTE — Telephone Encounter (Signed)
Transition Care Management Follow-up Telephone Call  How have you been since you were released from the hospital?  Spoke with patient husband that she is having problems with short term memory, even before UTI says she is seeing family members that are not there , and children when they were smaller.. Patient husband is asking can patient have an MRI or CT to check why she is talking like this he says this has been happening for the last year. He says patient does not no where she is at in the mornings and it takes her a while to walk. Patient husband would like to speak with PCP alone. [place].   Do you understand why you were in the hospital? yes   Do you understand the discharge instrcutions? yes  Items Reviewed:  Medications reviewed: yes  Allergies reviewed: yes  Dietary changes reviewed: yes  Referrals reviewed: yes   Functional Questionnaire:   Activities of Daily Living (ADLs):   She states they are independent in the following: bathing and hygiene, feeding, continence, grooming, toileting and dressing States they require assistance with the following: Needs assistance walking.   Any transportation issues/concerns?: no   Any patient concerns? Yes, see above....   Confirmed importance and date/time of follow-up visits scheduled: yes   Confirmed with patient if condition begins to worsen call PCP or go to the ER.  Patient was given the Call-a-Nurse line [phone]: yes

## 2018-08-02 NOTE — Telephone Encounter (Signed)
Pt had CT head 07/2018 with brain shrinkage likely due to age called husband [name] [phone] but no answer 08/02/2018 at 5:20 pm  Will have staff try to call him and get him on the phone  If he is worried about memory maybe neurology can help in future     FINDINGS: Brain: No evidence of acute infarction, hemorrhage, extra-axial collection, ventriculomegaly, or mass effect. Generalized cerebral atrophy. Periventricular white matter low attenuation likely secondary to microangiopathy.  Vascular: Cerebrovascular atherosclerotic calcifications are noted.  Skull: Negative for fracture or focal lesion.  Sinuses/Orbits: Visualized portions of the orbits are unremarkable. Visualized portions of the paranasal sinuses and mastoid air cells are unremarkable.  Other: None.  IMPRESSION: No acute intracranial pathology.

## 2018-08-02 NOTE — Telephone Encounter (Signed)
I spoke to the patient's husband.  Patient has since been to the ER for a fall.  They have not gone to the pharmacy to pick it up yet.  They will pick it up today and start taking it today.

## 2018-08-03 ENCOUNTER — Telehealth: Payer: Self-pay | Admitting: Internal Medicine

## 2018-08-03 ENCOUNTER — Ambulatory Visit (INDEPENDENT_AMBULATORY_CARE_PROVIDER_SITE_OTHER): Payer: PPO | Admitting: Internal Medicine

## 2018-08-03 ENCOUNTER — Other Ambulatory Visit: Payer: Self-pay

## 2018-08-03 ENCOUNTER — Telehealth: Payer: Self-pay | Admitting: Urology

## 2018-08-03 ENCOUNTER — Encounter: Payer: Self-pay | Admitting: Internal Medicine

## 2018-08-03 ENCOUNTER — Telehealth: Payer: Self-pay | Admitting: Cardiovascular Disease

## 2018-08-03 DIAGNOSIS — F32A Depression, unspecified: Secondary | ICD-10-CM

## 2018-08-03 DIAGNOSIS — N3281 Overactive bladder: Secondary | ICD-10-CM

## 2018-08-03 DIAGNOSIS — F419 Anxiety disorder, unspecified: Secondary | ICD-10-CM | POA: Diagnosis not present

## 2018-08-03 DIAGNOSIS — L97523 Non-pressure chronic ulcer of other part of left foot with necrosis of muscle: Secondary | ICD-10-CM | POA: Diagnosis not present

## 2018-08-03 DIAGNOSIS — R5381 Other malaise: Secondary | ICD-10-CM | POA: Diagnosis not present

## 2018-08-03 DIAGNOSIS — R413 Other amnesia: Secondary | ICD-10-CM | POA: Diagnosis not present

## 2018-08-03 DIAGNOSIS — F329 Major depressive disorder, single episode, unspecified: Secondary | ICD-10-CM

## 2018-08-03 DIAGNOSIS — R443 Hallucinations, unspecified: Secondary | ICD-10-CM

## 2018-08-03 DIAGNOSIS — R251 Tremor, unspecified: Secondary | ICD-10-CM | POA: Diagnosis not present

## 2018-08-03 DIAGNOSIS — W19XXXD Unspecified fall, subsequent encounter: Secondary | ICD-10-CM | POA: Diagnosis not present

## 2018-08-03 DIAGNOSIS — N390 Urinary tract infection, site not specified: Secondary | ICD-10-CM | POA: Diagnosis not present

## 2018-08-03 MED ORDER — FOSFOMYCIN TROMETHAMINE 3 G PO PACK: 3.0000 g | PACK | Freq: Once | ORAL | 0 refills | Status: AC

## 2018-08-03 MED ORDER — BUPROPION HCL ER (XL) 150 MG PO TB24: 150.0000 mg | ORAL_TABLET | Freq: Every day | ORAL | 3 refills | Status: DC

## 2018-08-03 NOTE — Telephone Encounter (Signed)
   Primary Cardiologist: No primary care provider on file.  Chart reviewed as part of pre-operative protocol coverage. Patient was contacted 08/03/2018 in reference to pre-operative risk assessment for pending surgery as outlined below.  [name] was last seen on 03/24/2018 by Dr. Fletcher Anon  Since that day, [name] has done well from a cardiac perspective.  Her mobility is currently limited by her chronic left foot ulcer.  She is in a boot and requires a walker she underwent nuclear stress imaging earlier this year which was negative for reversible ischemia.  This is very encouraging given her risk factors.  Per our pharmacy staff: Pt takes Pradaxa for afib with CHADS2VASc score of 5 (age, sex, HTN, DM, PVD), also with history of DVT in right calf. Renal function is normal. Ok to hold Pradaxa for 2 days prior to procedure  Therefore, based on ACC/AHA guidelines, the patient would be at acceptable risk for the planned procedure without further cardiovascular testing.   I will route this recommendation to the requesting party via Epic fax function and remove from pre-op pool.  Please call with questions.  [place], PA 08/03/2018, 3:55 PM

## 2018-08-03 NOTE — Telephone Encounter (Signed)
Please stop Oxybutynin discussed with urology continue only [place]  [place]

## 2018-08-03 NOTE — Telephone Encounter (Signed)
I let Mr. Austell know that we have received the PA approval for the fosomycin and to pick it up at [place].

## 2018-08-03 NOTE — Telephone Encounter (Signed)
Pt takes Pradaxa for afib with CHADS2VASc score of 5 (age, sex, HTN, DM, PVD), also with history of DVT in right calf. Renal function is normal. Ok to hold Pradaxa for 2 days prior to procedure.

## 2018-08-03 NOTE — Telephone Encounter (Signed)
Does she want to see a plastic surgeon who specializes in wounds to try to heel her left heel wound I disc'ed with Dr. [name] and he would be ok with this  [place]

## 2018-08-03 NOTE — Progress Notes (Addendum)
Telephone Note HFU  I connected with [name]   on 08/03/18 at  1:20 PM EDT by telephone and verified that I am speaking with the correct person using two identifiers.  Location patient: home Location provider:work  Persons participating in the virtual visit: patient, provider, pts husband and grandkids   I discussed the limitations of evaluation and management by telemedicine and the availability of in person appointments. The patient expressed understanding and agreed to proceed.   HPI: 1. HFU dehydration, hyperkalemia, 3 falls and E coli UTI with h/o chronic UTI. Spoke with urology patient had 1 dose of fosfomycin in the hospital and will pick up another dose today medication needed PA. She also was tx'ed with macrobid for resistant UTI and urology referring to ID. She denies dysuria and has been drinking plenty of water   Patient is doing better except for balance issues and stability worse w/in the last 2 months and she has had change in left heel boot due to wound on her foot (following with podiatry Dr. [name]). She reports has steps at home and having trouble going down but especially up the steps and needs assistance.  H/H PT and OT was supposed to call pt from the hospital but so far no work  [employer] asked patient about memory she states when she first went to the hospital she was having problems with word finding but this is better reviewed CT head 57/2020 brain atrophy and atherosclerosis but otherwise normal reviewed with patient husband  Reviewed medication list meds which can cause confusion I.e cymbalta, wellbutrin xl on 300 mg qd, lyrica 25 mg taking bid instead of tid, xanax on 0.5 up to bid prn, chronic pain medications on toviaz and oxybutynin -reviewed meds will pt and husband and advised will try to reduce amt of medications CC pain clinic and urology   Chronic wound to left heel husband is c/w with it not heeling and it has been some time. Will CC Dr. [name]. I  advised pt and husband medications could be affecting balance, heel wound, and deconditioning   In confidence call husband [name] [phone].as he has called the clinic several times since pts hospital admission with concerns about the patient.  -They have been married 32 years and he has prostate cancer, weakened immune system and reduced strength with age and health and it is hard to care and lift and help her with balance at home. He is in the process of building a ramp for the home. He does not want to her to go into a home but is concerned about her decline in health over the last 1.5 year and worse w/in the last 6 months and most recently 3 falls w/in the last couple of weeks prior to hospitalization.  He helps her shower as well.  He reports pt is foggy in am and disoriented and does not know where she is and has hallucinations of family members. Her balance is off and worsening. He is afraid to leave her unattended. He has to help her go down the steps in the front of their house and going up steps is even worse in the process of trying to build a ramp. He also notices her right hand shakes and with memory and hallucinations he is concerned with Parkinsons.  She will wake up in the middle of the night 1 hr to 1.5 hrs and have her legs kicked off the side of the bed and she is heavy and hard to lift  back legs onto the bed.    ROS: See pertinent positives and negatives per HPI.  Past Medical History:  Diagnosis Date  . Abnormal antibody titer   . Anxiety and depression   . Arthritis   . Asthma   . Cystocele   . Depression   . Diabetes ([place])   . DVT (deep venous thrombosis) (HCC)    Righ calf  . Dysrhythmia   . Edema   . GERD (gastroesophageal reflux disease)   . Heart murmur    Echocardiogram June 2015: Mild MR (possible vegetation seen on TTE, not seen on TEE), normal LV size with moderate concentric LVH. Normal function EF 60-65%. Normal diastolic function. Mild LA dilation.  Marland Kitchen  History of IBS   . History of methicillin resistant staphylococcus aureus (MRSA) 2008  . Hyperlipidemia   . Hypertension   . Hypothyroidism   . Insomnia   . Kidney stone    kidney stones with lithotripsy  . Neuropathy involving both lower extremities   . Obesity, Class II, BMI 35-39.9, with comorbidity   . Paroxysmal atrial fibrillation ([place]) 2007   In June 2015, Cardiac Event Monitor: Mostly SR/sinus arrhythmia with PVCs that are frequent. Short bursts of A. fib lasting several minutes;; CHA2DS2-VASc Score = 5 (age, Female, PVD, DM, HTN)  . Peripheral vascular disease ([place])   . Sleep apnea    use C-PAP  . Urinary incontinence   . Venous stasis dermatitis of both lower extremities     Past Surgical History:  Procedure Laterality Date  . ANKLE SURGERY Right   . APPLICATION OF WOUND VAC Left 06/27/2015   Procedure: APPLICATION OF WOUND VAC ( POSSIBLE ) ;  Surgeon: [name], MD;  Location: [facility] ORS;  Service: Vascular;  Laterality: Left;  . APPLICATION OF WOUND VAC Left 09/25/2016   Procedure: APPLICATION OF WOUND VAC;  Surgeon: [name], DPM;  Location: [facility] ORS;  Service: Podiatry;  Laterality: Left;  . arm surgery     right    . CHOLECYSTECTOMY    . COLONOSCOPY    . COLONOSCOPY WITH PROPOFOL N/A 01/11/2017   Procedure: COLONOSCOPY WITH PROPOFOL;  Surgeon: [name], MD;  Location: [facility] ENDOSCOPY;  Service: Endoscopy;  Laterality: N/A;  . COLONOSCOPY WITH PROPOFOL N/A 02/22/2017   Procedure: COLONOSCOPY WITH PROPOFOL;  Surgeon: [name], MD;  Location: [facility] ENDOSCOPY;  Service: Endoscopy;  Laterality: N/A;  . COLONOSCOPY WITH PROPOFOL N/A 05/31/2017   Procedure: COLONOSCOPY WITH PROPOFOL;  Surgeon: [name], MD;  Location: [facility] ENDOSCOPY;  Service: Endoscopy;  Laterality: N/A;  . ESOPHAGOGASTRODUODENOSCOPY (EGD) WITH PROPOFOL N/A 01/11/2017   Procedure: ESOPHAGOGASTRODUODENOSCOPY (EGD) WITH PROPOFOL;  Surgeon: [name], MD;  Location:  [facility] ENDOSCOPY;  Service: Endoscopy;  Laterality: N/A;  . HERNIA REPAIR     umbilical  . HIATAL HERNIA REPAIR    . I&D EXTREMITY Left 06/27/2015   Procedure: IRRIGATION AND DEBRIDEMENT EXTREMITY            ( CALF HEMATOMA ) POSSIBLE WOUND VAC;  Surgeon: [name], MD;  Location: [facility] ORS;  Service: Vascular;  Laterality: Left;  . INCISION AND DRAINAGE OF WOUND Left 09/25/2016   Procedure: IRRIGATION AND DEBRIDEMENT - PARTIAL RESECTION OF ACHILLES TENDON WITH WOUND VAC APPLICATION;  Surgeon: [name], DPM;  Location: [facility] ORS;  Service: Podiatry;  Laterality: Left;  . IRRIGATION AND DEBRIDEMENT ABSCESS Left 09/07/2016   Procedure: IRRIGATION AND DEBRIDEMENT ABSCESS LEFT HEEL;  Surgeon: [name], DPM;  Location: [facility] ORS;  Service: Podiatry;  Laterality: Left;  . JOINT REPLACEMENT  2013   left knee replacement  . KIDNEY SURGERY Right    kidney stones  . LITHOTRIPSY    . LOWER EXTREMITY ANGIOGRAPHY Left 04/04/2018   Procedure: LOWER EXTREMITY ANGIOGRAPHY;  Surgeon: [name], MD;  Location: [place] CV LAB;  Service: Cardiovascular;  Laterality: Left;  . NM GATED MYOVIEW ([place] HX)  February 2017   Likely breast attenuation. LOW RISK study. Normal EF 55-60%.  . OTHER SURGICAL HISTORY     08/2016 or 09/2016 surgery on achilles tenso h/o staph infection heal. Dr. [name]  . removal of left hematoma Left    leg  . REVERSE SHOULDER ARTHROPLASTY Right 10/08/2015   Procedure: REVERSE SHOULDER ARTHROPLASTY;  Surgeon: [name], MD;  Location: [facility] ORS;  Service: Orthopedics;  Laterality: Right;  . SLEEVE GASTROPLASTY    . TONSILLECTOMY    . TOTAL KNEE ARTHROPLASTY    . TOTAL SHOULDER REPLACEMENT Right   . TRANSESOPHAGEAL ECHOCARDIOGRAM  08/08/2013   Mild LVH, EF 60-65%. Moderate LA dilation and mild RA dilation. Mild MR with no evidence of stenosis and no evidence of endocarditis. A false chordae is noted.  . TRANSTHORACIC ECHOCARDIOGRAM  08/03/2013   Mild-Moderate concentric  LVH, EF 60-65%. Normal diastolic function. Mild LA dilation. Mild MR with possible vegetation  - not confirmed on TEE   . ULNAR NERVE TRANSPOSITION Right 08/06/2016   Procedure: ULNAR NERVE DECOMPRESSION/TRANSPOSITION;  Surgeon: [name], MD;  Location: [facility] ORS;  Service: Orthopedics;  Laterality: Right;  . UPPER GI ENDOSCOPY    . VAGINAL HYSTERECTOMY      Family History  Problem Relation Age of Onset  . Skin cancer Father   . Diabetes Father   . Hypertension Father   . Peripheral vascular disease Father   . Cancer Father   . Cerebral aneurysm Father   . Alcohol abuse Father   . Varicose Veins Mother   . Kidney disease Mother   . Arthritis Mother   . Mental illness Sister   . Cancer Maternal Aunt        breast  . Breast cancer Maternal Aunt   . Arthritis Maternal Grandmother   . Hypertension Maternal Grandmother   . Diabetes Maternal Grandmother   . Arthritis Maternal Grandfather   . Heart disease Maternal Grandfather   . Hypertension Maternal Grandfather   . Arthritis Paternal Grandmother   . Hypertension Paternal Grandmother   . Diabetes Paternal Grandmother   . Arthritis Paternal Grandfather   . Hypertension Paternal Grandfather   . Bladder Cancer Neg Hx   . Kidney cancer Neg Hx     SOCIAL HX: married to husband [name] x 32 years    Current Outpatient Medications:  .  albuterol (PROVENTIL HFA;VENTOLIN HFA) 108 (90 Base) MCG/ACT inhaler, Inhale 1-2 puffs into the lungs every 4 (four) hours as needed for wheezing or shortness of breath. (Patient taking differently: Inhale 2 puffs into the lungs every 4 (four) hours as needed for wheezing or shortness of breath. ), Disp: 8 g, Rfl: 2 .  albuterol (PROVENTIL) (2.5 MG/3ML) 0.083% nebulizer solution, Take 3 mLs (2.5 mg total) by nebulization every 6 (six) hours as needed for wheezing or shortness of breath., Disp: 150 mL, Rfl: 1 .  ALPRAZolam (XANAX) 0.5 MG tablet, Take 1 tablet (0.5 mg total) by mouth 2 (two) times daily  as needed for anxiety., Disp: 60 tablet, Rfl: 2 .  amLODipine (  NORVASC) 5 MG tablet, Take 1 tablet (5 mg total) by mouth daily., Disp: 90 tablet, Rfl: 3 .  buPROPion (WELLBUTRIN XL) 150 MG 24 hr tablet, Take 1 tablet (150 mg total) by mouth daily., Disp: 90 tablet, Rfl: 3 .  Calcium Citrate-Vitamin D (CALCIUM + D PO), Take 1 tablet by mouth 2 (two) times daily., Disp: , Rfl:  .  clobetasol cream (TEMOVATE) 0.27 %, Apply 1 application topically 2 (two) times daily. To arms (Patient taking differently: Apply 1 application topically 2 (two) times daily as needed (irritation). To arms), Disp: 60 g, Rfl: 1 .  dabigatran (PRADAXA) 150 MG CAPS capsule, Take 1 capsule (150 mg total) by mouth 2 (two) times daily., Disp: 180 capsule, Rfl: 1 .  Dexlansoprazole (DEXILANT) 30 MG capsule, Take 1 capsule (30 mg total) by mouth daily. Note lower maintenance dose take 30 min before food lunch or dinner not with thyroid medication (Patient taking differently: Take 60 mg by mouth daily. Note lower maintenance dose take 30 min before food lunch or dinner not with thyroid medication), Disp: 90 capsule, Rfl: 3 .  diclofenac sodium (VOLTAREN) 1 % GEL, Apply 1 application topically daily as needed (pain). , Disp: , Rfl:  .  DULoxetine (CYMBALTA) 60 MG capsule, Take 1 capsule (60 mg total) by mouth daily., Disp: 90 capsule, Rfl: 0 .  estradiol (ESTRACE) 0.1 MG/GM vaginal cream, Apply 0.5mg  (pea-sized amount)  just inside the vaginal introitus with a finger-tip on Monday, Wednesday and Friday nights,, Disp: 42.5 g, Rfl: 11 .  fesoterodine (TOVIAZ) 8 MG TB24 tablet, Take 1 tablet (8 mg total) by mouth daily., Disp: 90 tablet, Rfl: 3 .  fluticasone-salmeterol (ADVAIR HFA) 115-21 MCG/ACT inhaler, Inhale 2 puffs into the lungs daily. Rinse mouth thoroughly after use, Disp: 1 Inhaler, Rfl: 12 .  fosfomycin (MONUROL) 3 g PACK, Take 3 g by mouth once for 1 dose., Disp: 3 g, Rfl: 0 .  hydrochlorothiazide (HYDRODIURIL) 12.5 MG tablet,  Take 1 tablet (12.5 mg total) by mouth daily. In am, Disp: 90 tablet, Rfl: 3 .  [START ON 08/07/2018] HYDROcodone-acetaminophen (NORCO/VICODIN) 5-325 MG tablet, Take 1 tablet by mouth every 8 (eight) hours as needed for up to 30 days for severe pain., Disp: 90 tablet, Rfl: 0 .  HYDROcodone-acetaminophen (NORCO/VICODIN) 5-325 MG tablet, Take 1 tablet by mouth every 8 (eight) hours as needed for up to 30 days for severe pain., Disp: 90 tablet, Rfl: 0 .  levocetirizine (XYZAL) 5 MG tablet, TAKE 1 TABLET BY MOUTH EVERY EVENING, Disp: 90 tablet, Rfl: 3 .  levothyroxine (SYNTHROID, LEVOTHROID) 175 MCG tablet, Take 1 tablet (175 mcg total) by mouth daily before breakfast., Disp: 90 tablet, Rfl: 3 .  losartan (COZAAR) 50 MG tablet, Take 1 tablet (50 mg total) by mouth daily. Do not take quinapril 40, Disp: 90 tablet, Rfl: 3 .  metFORMIN (GLUCOPHAGE) 1000 MG tablet, Take 1 tablet (1,000 mg total) by mouth 2 (two) times daily. With food, Disp: 180 tablet, Rfl: 3 .  montelukast (SINGULAIR) 10 MG tablet, Take 1 tablet (10 mg total) by mouth daily., Disp: 90 tablet, Rfl: 3 .  mupirocin ointment (BACTROBAN) 2 %, Apply 1 application topically 2 (two) times daily. Skin wounds, Disp: 30 g, Rfl: 0 .  nitrofurantoin, macrocrystal-monohydrate, (MACROBID) 100 MG capsule, Take 1 capsule (100 mg total) by mouth every 12 (twelve) hours., Disp: 14 capsule, Rfl: 0 .  nystatin cream (MYCOSTATIN), Apply 1 application topically 2 (two) times daily. Mouth prn for 7  days as needed (Patient taking differently: Apply 1 application topically 2 (two) times daily as needed for dry skin. Mouth prn for 7 days as needed), Disp: 30 g, Rfl: 11 .  ondansetron (ZOFRAN) 4 MG tablet, Take 1 tablet (4 mg total) by mouth every 8 (eight) hours as needed for nausea or vomiting., Disp: 30 tablet, Rfl: 0 .  oxybutynin (DITROPAN XL) 15 MG 24 hr tablet, TAKE ONE TABLET BY MOUTH AT BEDTIME, Disp: 90 tablet, Rfl: 3 .  pregabalin (LYRICA) 25 MG capsule, Take  1 capsule (25 mg total) by mouth 3 (three) times daily. (Patient taking differently: Take 25 mg by mouth 3 (three) times daily. Pt is taking bid not tid), Disp: 90 capsule, Rfl: 2 .  zinc oxide 20 % ointment, Apply 1 application topically as needed for irritation., Disp: 56.7 g, Rfl: 0 .  metoprolol succinate (TOPROL-XL) 50 MG 24 hr tablet, Take 1 tablet (50 mg total) by mouth daily. Take with or immediately following a meal., Disp: 90 tablet, Rfl: 3  Current Facility-Administered Medications:  .  ipratropium-albuterol (DUONEB) 0.5-2.5 (3) MG/3ML nebulizer solution 3 mL, 3 mL, Nebulization, Q6H, [name], [name], MD  EXAM:  VITALS per patient if applicable:  GENERAL: alert, oriented, appears well and in no acute distress  PSYCH/NEURO: pleasant and cooperative, no obvious depression or anxiety, speech and thought processing grossly intact  ASSESSMENT AND PLAN:  Discussed the following assessment and plan:  Chronic UTI/overactive bladder-cc'ed urology about trying to stop oxybutynin 15 and continue only toviaz  ID referral pending for MDR UTI  Anxiety and depression - Plan: buPROPion (WELLBUTRIN XL) 150 MG 24 hr tablet reduced from 300 mg  Will try to reduce cymbalta in the future 30 and reduce xanax 0.5 bid to 0.25 mg qd to bid in the future   Fall, physical deconditioning-needs h/h PT/ OT may have been discharged from hospital with this if not call me back w/in the next 2 weeks   Tremor of right hand/hallucinations/memory loss-will try to reduce medication list. Husband wants to consider neurology referral to rule out PD -CC'ed pain clinic to see if they will reduce lyrica pt taking 25 bid not tid, we see if cymbalta can be reduced from 60 to 30 mg?    Skin ulcer of left foot with necrosis of muscle (HCC)-will disc with Dr. [name] about plastics referral for left heel   HM A1C 6.6 07/29/2018  Flu shot 02/02/18 utdpna 23, prevnar Tdap utd   considershingrix in  future Given Rx twinrixin pastto get at pharmacy h/o fatty liver  DEXA 10/17/14 normal  Out of age windowpapOb/gyn  mammo neg5/20/19 negative -referred previously    05/31/17 [place] with diverticulosis and polypsmultiple tubular and hyperplastic due in 2023   EGD had 10/25/2018 repeat due in 1 year [facility] Dr. Gustavo Lah   I discussed the assessment and treatment plan with the patient. The patient was provided an opportunity to ask questions and all were answered. The patient agreed with the plan and demonstrated an understanding of the instructions.   The patient was advised to call back or seek an in-person evaluation if the symptoms worsen or if the condition fails to improve as anticipated.  Time spent 25 minutes  Delorise Jackson, MD

## 2018-08-03 NOTE — Telephone Encounter (Signed)
fosomycin sent to pharmacy.

## 2018-08-03 NOTE — Telephone Encounter (Signed)
Please comment on pradaxa. 

## 2018-08-03 NOTE — Telephone Encounter (Signed)
° °   [facility] Pre-operative Risk Assessment    Request for surgical clearance:  1. What type of surgery is being performed? EGD  2. When is this surgery scheduled? 09/29/18  3. What type of clearance is required (medical clearance vs. Pharmacy clearance to hold med vs. Both)? BOTH   4. Are there any medications that need to be held prior to surgery and how long? pradaxa please advise   5. Practice name and name of physician performing surgery? [facility] Dr. Gustavo Lah   6. What is your office phone number [phone]    7.   What is your office fax number [phone]   8.   Anesthesia type (None, local, MAC, general) ? monitored   Clarisse Gouge 08/03/2018, 11:32 AM  _________________________________________________________________   (provider comments below)

## 2018-08-04 NOTE — Telephone Encounter (Signed)
Call pt would she be agreeable to drive to [facility]?  Dr. Lyndee Leo Sanger Dillingham   [place]

## 2018-08-04 NOTE — Telephone Encounter (Signed)
Unable to leave message for patient to return call back. PEC may give and obtain information.  

## 2018-08-04 NOTE — Telephone Encounter (Signed)
Patient has been informed. She had no question or comments.

## 2018-08-04 NOTE — Telephone Encounter (Signed)
She is ok with referral to plastic surgeon.

## 2018-08-05 ENCOUNTER — Other Ambulatory Visit: Payer: Self-pay

## 2018-08-05 NOTE — Patient Outreach (Signed)
[place] [facility]) Care Management  08/05/2018  Letricia Krinsky [birth year] [id number]  EMMI: general discharge red alert Referral date: 08/05/18 Referral reason: lost interest in things: yes Insurance: health team advantage Day # 4   ATTEMPT #1  Telephone call to patient regarding EMMI general discharge red alert. Unable to reach patient or leave voice message.  Phone only rang  PLAN: RNCM will attempt 2nd telephone call to patient within 4 business days.  RNCM will send outreach letter to patient to attempt contact  Quinn Plowman RN,BSN,CCM [facility] Telephonic  [phone]

## 2018-08-08 DIAGNOSIS — L97929 Non-pressure chronic ulcer of unspecified part of left lower leg with unspecified severity: Secondary | ICD-10-CM | POA: Diagnosis not present

## 2018-08-08 DIAGNOSIS — E1143 Type 2 diabetes mellitus with diabetic autonomic (poly)neuropathy: Secondary | ICD-10-CM | POA: Diagnosis not present

## 2018-08-08 DIAGNOSIS — I83029 Varicose veins of left lower extremity with ulcer of unspecified site: Secondary | ICD-10-CM | POA: Diagnosis not present

## 2018-08-08 DIAGNOSIS — L97521 Non-pressure chronic ulcer of other part of left foot limited to breakdown of skin: Secondary | ICD-10-CM | POA: Diagnosis not present

## 2018-08-09 ENCOUNTER — Other Ambulatory Visit: Payer: Self-pay

## 2018-08-09 NOTE — Patient Outreach (Signed)
[name]) Care Management  08/09/2018  [name] 1951/02/24 [id number]  EMMI: general discharge red alert Referral date: 08/05/18 Referral reason: lost interest in things: yes Insurance: health team advantage Day # 4   Telephone call to patient regarding EMMI general discharge red alert. HIPAA verified with patient. RNCM introduced herself and explained reason for call. RNCM inquired if patient had lost interest in things. Patient replied, " who wouldn't have"  Patient states  approximately 2 years ago she had a major staph infection on her left foot.  She states her heel looks like it is healing but there is an ulcer on the bottom of her foot that is not healing.  Patient states, " I am a large woman but I've always been very active.  I can't get in the kitchen to cook by myself without the help of someone.  This has become very frustrating for me."  Patient states she is ok some days and some days she is depressed regarding this.  Patient states she takes Welbutrin. She reports her doctor decreased her dose last week because the doctor felt the medication was contributing to her balance issues.  Patient states the doctor has adjusted some of her medications. Patient reports she has spoken with her primary MD office since discharge from the hospital.   Patient states she uses a walker and has become frustrated with this because she is unable to maneuver to do things.  Patient states she and her husband use to go and all the time but now they have not been able to. She states, " the coronavirus has stopped that as well.  Patient states she sees Dr. [name] 1 time per week because of her foot. Patient states she was placed in a [place] boot on yesterday. Patient reports she has seen Dr. [name] almost every week for the past 2 years due to her left foot.  Patient states her primary MD made recommendation to Dr. [name] to send her to a plastic surgeon.  Patient states she will follow up  with Dr. [name] again next week. Patient states she has been to the wound care clinic in the past but not for this issue with her left foot.  Patient states she has had 3 falls within the past week. She reports her most recent fall cause abrasions to her left leg. Patient states her doctor is aware. Patient states a nurse, physical therapist and occupational therapist from [facility] will be following up with her this week.  RNCM discussed and offered [facility] care management services. Patient verbally agreed to have social work follow up regarding depression symptoms. RNCM offered follow up call to patient.  Patient verbally agreed.   PLAN; RNCM will refer patient to Education officer, museum.   Quinn Plowman RN,BSN,CCM [facility] Telephonic  [phone]

## 2018-08-10 ENCOUNTER — Ambulatory Visit: Admission: RE | Admit: 2018-08-10 | Payer: PPO | Source: Ambulatory Visit

## 2018-08-12 ENCOUNTER — Telehealth: Payer: Self-pay | Admitting: Internal Medicine

## 2018-08-12 ENCOUNTER — Other Ambulatory Visit: Payer: Self-pay | Admitting: Internal Medicine

## 2018-08-12 DIAGNOSIS — M1990 Unspecified osteoarthritis, unspecified site: Secondary | ICD-10-CM

## 2018-08-12 DIAGNOSIS — J309 Allergic rhinitis, unspecified: Secondary | ICD-10-CM

## 2018-08-12 MED ORDER — LEVOCETIRIZINE DIHYDROCHLORIDE 5 MG PO TABS: 5.0000 mg | ORAL_TABLET | Freq: Every evening | ORAL | 3 refills | Status: DC

## 2018-08-12 MED ORDER — MELOXICAM 7.5 MG PO TABS: 7.5000 mg | ORAL_TABLET | Freq: Every day | ORAL | 2 refills | Status: DC | PRN

## 2018-08-12 NOTE — Telephone Encounter (Signed)
Copied from [place] [id number]. Topic: Quick Communication - See Telephone Encounter >> Aug 12, 2018  4:11 PM Berneta Levins wrote: CRM for notification. See Telephone encounter for: 08/12/18.  Pt states that Dr. [name] told her to alter the way she was taking medication.  Pt states that she is now having problems with incontinence again and would like to speak with someone.  Pt states that she is okay with a call back on Tuesday. Pt can be reached at [phone]

## 2018-08-16 ENCOUNTER — Encounter: Payer: Self-pay | Admitting: Pain Medicine

## 2018-08-16 ENCOUNTER — Other Ambulatory Visit: Payer: Self-pay | Admitting: Internal Medicine

## 2018-08-16 ENCOUNTER — Other Ambulatory Visit: Payer: Self-pay | Admitting: *Deleted

## 2018-08-16 ENCOUNTER — Encounter: Payer: Self-pay | Admitting: *Deleted

## 2018-08-16 DIAGNOSIS — S91302A Unspecified open wound, left foot, initial encounter: Secondary | ICD-10-CM

## 2018-08-16 NOTE — Telephone Encounter (Signed)
Patient wants to know why she is not taking the oxybutin anymore.  Now she is having incontinence. Having accidents. She went back on oxybutin and it is helping.

## 2018-08-16 NOTE — Patient Outreach (Signed)
[place] [facility]) Care Management  08/16/2018  [name] 03-Jan-1951 [id number]   CSW made an initial attempt to try and contact patient today to perform the initial phone assessment, as well as assess and assist with social work needs and services, without success.  A HIPAA compliant message was left for patient on voicemail.  CSW is currently awaiting a return call.  CSW will make a second outreach attempt within the next 3-4 business days, if a return call is not received from patient in the meantime.  CSW will also mail an Outreach Letter to patient's home requesting that patient contact CSW if patient is interested in receiving social work services through [place] with Scientist, clinical (histocompatibility and immunogenetics).  [name], BSW, MSW, LCSW  Licensed Education officer, environmental [facility]  [address] [place], [place] [address] Physical Address-[address], [place], [place] [address] Toll Free Main # [phone] Fax # [phone] Cell # [phone]  Office # [phone] [email or web address]

## 2018-08-16 NOTE — Telephone Encounter (Signed)
I spoke with urology about the concern about so many medications and we decided to stop the Oxybutynin as this has more side effects   I really want her to stop this medication due to its side effects  Also I referred her to Dr. Theodoro Kos dillingham a plastic surgeon who deals with wounds and discussed this with Dr. [name] and he was ok with it this doctor is in [place]   [facility]

## 2018-08-16 NOTE — Progress Notes (Addendum)
Pain Management Virtual Encounter Note - Virtual Visit via Telephone Telehealth (real-time audio visits between healthcare provider and patient).  Patient's Phone No. & Preferred Pharmacy:  [phone] (home); [phone] (mobile); (Preferred) [phone] [email or web address]  [place], [place] - [address] [place] [address] Phone: [phone] Fax: [phone]   Pre-screening note:  Our staff contacted [name] and offered her an "in person", "face-to-face" appointment versus a telephone encounter. She indicated preferring the telephone encounter, at this time.  Reason for Virtual Visit: COVID-19*  Social distancing based on CDC and AMA recommendations.   I contacted [name] on 08/17/2018 at 9:12 AM via telephone.      I clearly identified myself as Gaspar Cola, MD. I verified that I was speaking with the correct person using two identifiers (Name and date of birth: 1950/08/16).  Advanced Informed Consent I sought verbal advanced consent from [name] for virtual visit interactions. I informed [name] of possible security and privacy concerns, risks, and limitations associated with providing "not-in-person" medical evaluation and management services. I also informed [name] of the availability of "in-person" appointments. Finally, I informed her that there would be a charge for the virtual visit and that she could be  personally, fully or partially, financially responsible for it. [name] expressed understanding and agreed to proceed.   Historic Elements   [name] is a 68 y.o. year old, female patient evaluated today after her last encounter by our practice on 07/15/2018. [name]  has a past medical history of Abnormal antibody titer, Anxiety and depression, Arthritis, Asthma, Cystocele, Depression, Diabetes ([place]), DVT (deep venous thrombosis) ([place]), Dysrhythmia, Edema, GERD (gastroesophageal reflux  disease), Heart murmur, History of IBS, History of methicillin resistant staphylococcus aureus (MRSA) (2008), Hyperlipidemia, Hypertension, Hypothyroidism, Insomnia, Kidney stone, Neuropathy involving both lower extremities, Obesity, Class II, BMI 35-39.9, with comorbidity, Paroxysmal atrial fibrillation ([place]) (2007), Peripheral vascular disease ([place]), Sleep apnea, Urinary incontinence, and Venous stasis dermatitis of both lower extremities. She also  has a past surgical history that includes Ankle surgery (Right); Vaginal hysterectomy; Sleeve Gastroplasty; Lithotripsy; Kidney surgery (Right); transthoracic echocardiogram (08/03/2013); transesophageal echocardiogram (08/08/2013); Tonsillectomy; Total knee arthroplasty; Hiatal hernia repair; Cholecystectomy; I&D extremity (Left, 06/27/2015); Application if wound vac (Left, 06/27/2015); removal of left hematoma (Left); NM GATED MYOVIEW [facility] HX) (February 2017); Reverse shoulder arthroplasty (Right, 10/08/2015); Hernia repair; Ulnar nerve transposition (Right, 08/06/2016); arm surgery; Irrigation and debridement abscess (Left, 09/07/2016); Incision and drainage of wound (Left, 09/25/2016); Application if wound vac (Left, 09/25/2016); Colonoscopy; Upper gi endoscopy; Esophagogastroduodenoscopy (egd) with propofol (N/A, 01/11/2017); Colonoscopy with propofol (N/A, 01/11/2017); Colonoscopy with propofol (N/A, 02/22/2017); OTHER SURGICAL HISTORY; Total shoulder replacement (Right); Joint replacement (2013); Colonoscopy with propofol (N/A, 05/31/2017); and Lower Extremity Angiography (Left, 04/04/2018). [name] has a current medication list which includes the following prescription(s): albuterol, albuterol, amlodipine, bupropion, calcium citrate-vitamin d, clobetasol cream, dabigatran, dexlansoprazole, diclofenac sodium, duloxetine, estradiol, fesoterodine, fluticasone-salmeterol, hydrochlorothiazide, hydrocodone-acetaminophen, hydrocodone-acetaminophen, hydrocodone-acetaminophen,  levocetirizine, levothyroxine, losartan, metformin, montelukast, mupirocin ointment, nitrofurantoin (macrocrystal-monohydrate), nystatin cream, ondansetron, pregabalin, and zinc oxide, and the following Facility-Administered Medications: ipratropium-albuterol. She  reports that she has never smoked. She has never used smokeless tobacco. She reports that she does not drink alcohol or use drugs. [name] is allergic to penicillins; aspirin; morphine and related; and terramycin [oxytetracycline].   HPI  I last communicated with her on 06/29/2018. Today, she is being contacted for medication management.  The patient was instructed to stop using the  meloxicam due to the risk of GI bleeding on the blood thinners.  She was also instructed to stop using the alprazolam, based on CDC guidelines.  Pharmacotherapy Assessment  Analgesic: Hydrocodone/APAP 5/325 one tablet every 8 hours (15 mg/day) MME/day:15 mg/day  Monitoring: Pharmacotherapy: No side-effects or adverse reactions reported. [place] PMP: PDMP reviewed during this encounter.       Compliance: No problems identified. Effectiveness: Clinically acceptable. Plan: Refer to "POC".  Pertinent Labs  Renal Function Lab Results  Component Value Date   BUN 21 07/29/2018   CREATININE 1.13 (H) 07/29/2018   BCR 20 10/10/2014   GFRAA 58 (L) 07/29/2018   GFRNONAA 50 (L) 07/29/2018   Hepatic Function Lab Results  Component Value Date   AST 19 07/28/2018   ALT 13 07/28/2018   ALBUMIN 3.7 07/28/2018   UDS Summary  Date Value Ref Range Status  05/19/2018 FINAL  Final    Comment:    ==================================================================== TOXASSURE SELECT 13 (MW) ==================================================================== Test                             Result       Flag       Units Drug Present and Declared for Prescription Verification   Alprazolam                     111          EXPECTED   ng/mg creat    Alpha-hydroxyalprazolam        162          EXPECTED   ng/mg creat    Source of alprazolam is a scheduled prescription medication.    Alpha-hydroxyalprazolam is an expected metabolite of alprazolam.   Hydrocodone                    560          EXPECTED   ng/mg creat   Hydromorphone                  30           EXPECTED   ng/mg creat   Dihydrocodeine                 86           EXPECTED   ng/mg creat   Norhydrocodone                 943          EXPECTED   ng/mg creat    Sources of hydrocodone include scheduled prescription    medications. Hydromorphone, dihydrocodeine and norhydrocodone are    expected metabolites of hydrocodone. Hydromorphone and    dihydrocodeine are also available as scheduled prescription    medications. ==================================================================== Test                      Result    Flag   Units      Ref Range   Creatinine              206              mg/dL      >=20 ==================================================================== Declared Medications:  The flagging and interpretation on this report are based on the  following declared medications.  Unexpected results may arise from  inaccuracies in the declared medications.  **Note: The testing scope of this  panel includes these medications:  Alprazolam  Hydrocodone (Hydrocodone-Acetaminophen)  **Note: The testing scope of this panel does not include following  reported medications:  Acetaminophen (Hydrocodone-Acetaminophen)  Albuterol  Amlodipine Besylate  Bupropion  Calcium citrate (Calcium citrate/Vitamin D)  Clobetasol  Dabigatran  Dexlansoprazole  Duloxetine  Estradiol  Fesoterodine  Fluticasone (Advair)  Gabapentin  Hydrochlorothiazide  Levocetirizine  Levothyroxine  Losartan (Losartan Potassium)  Metformin  Metoprolol  Montelukast  Mupirocin  Nystatin  Ondansetron  Oxybutynin  Pregabalin  Salmeterol (Advair)  Tiotropium  Topical Diclofenac  Vitamin D  (Calcium citrate/Vitamin D)  Zinc Oxide ==================================================================== For clinical consultation, please call [phone]. ====================================================================    Note: Above Lab results reviewed.   Recent imaging  CT Angio Chest PE W and/or Wo Contrast CLINICAL DATA:  Positive D-dimer, PE suspected  EXAM: CT ANGIOGRAPHY CHEST WITH CONTRAST  TECHNIQUE: Multidetector CT imaging of the chest was performed using the standard protocol during bolus administration of intravenous contrast. Multiplanar CT image reconstructions and MIPs were obtained to evaluate the vascular anatomy.  CONTRAST:  76mL OMNIPAQUE IOHEXOL 350 MG/ML SOLN  COMPARISON:  Chest x-ray today.  Chest CT 09/20/2016  FINDINGS: Cardiovascular: Heart is normal size. Aorta is normal caliber. Scattered coronary artery calcifications in the left anterior descending coronary artery. Scattered aortic calcifications. No filling defects in the pulmonary arteries to suggest pulmonary emboli.  Mediastinum/Nodes: Thyroid unremarkable. No mediastinal, hilar, or axillary adenopathy. Trachea and esophagus are unremarkable.  Lungs/Pleura: Tree-in-bud gland gas nodular densities in the anterior right upper lobe are stable since 2018, likely postinflammatory scarring. No acute confluent airspace opacities or effusions.  Upper Abdomen: Imaging into the upper abdomen shows no acute findings. Postoperative changes within the stomach, likely related to gastric sleeve.  Musculoskeletal: Chest wall soft tissues are unremarkable. No acute bony abnormality.  Review of the MIP images confirms the above findings.  IMPRESSION: No evidence of pulmonary embolus.  Punctate scattered left anterior descending coronary calcifications.  Aortic Atherosclerosis (ICD10-I70.0).  Electronically Signed   By: Rolm Baptise M.D.   On: 07/28/2018 20:47 CT Head Wo  Contrast CLINICAL DATA:  Muscle weakness, bilateral lower extremity pain  EXAM: CT HEAD WITHOUT CONTRAST  TECHNIQUE: Contiguous axial images were obtained from the base of the skull through the vertex without intravenous contrast.  COMPARISON:  12/24/2004  FINDINGS: Brain: No evidence of acute infarction, hemorrhage, extra-axial collection, ventriculomegaly, or mass effect. Generalized cerebral atrophy. Periventricular white matter low attenuation likely secondary to microangiopathy.  Vascular: Cerebrovascular atherosclerotic calcifications are noted.  Skull: Negative for fracture or focal lesion.  Sinuses/Orbits: Visualized portions of the orbits are unremarkable. Visualized portions of the paranasal sinuses and mastoid air cells are unremarkable.  Other: None.  IMPRESSION: No acute intracranial pathology.  Electronically Signed   By: Kathreen Devoid   On: 07/28/2018 20:47 DG Foot Complete Left CLINICAL DATA:  Left foot ulcer.  EXAM: LEFT FOOT - COMPLETE 3+ VIEW  COMPARISON:  Radiographs of February 16, 2012.  FINDINGS: There is no evidence of fracture or dislocation. Severe degenerative changes seen involving the first metatarsophalangeal joint. Soft tissues are unremarkable.  IMPRESSION: Severe osteoarthritis of first metatarsophalangeal joint. No acute abnormality is noted.  Electronically Signed   By: Marijo Conception M.D.   On: 07/28/2018 17:41 DG Chest 1 View CLINICAL DATA:  Weakness, shortness of breath.  EXAM: CHEST  1 VIEW  COMPARISON:  Radiograph of April 11, 2017.  FINDINGS: The heart size and mediastinal contours are within normal limits. No pneumothorax or  pleural effusion is noted. Left lung is clear. Stable elevated right hemidiaphragm is noted with associated subsegmental atelectasis. Status post right shoulder arthroplasty.  IMPRESSION: Stable elevated right hemidiaphragm with associated right basilar subsegmental  atelectasis.  Electronically Signed   By: Marijo Conception M.D.   On: 07/28/2018 17:38  Assessment  The primary encounter diagnosis was Chronic pain syndrome. Diagnoses of Diabetic peripheral neuropathy associated with type 2 diabetes mellitus ([place]), Osteoarthritis involving multiple joints, Fibromyalgia, and Chronic musculoskeletal pain were also pertinent to this visit.  Plan of Care  I have discontinued Izora Gala Searfoss's HYDROcodone-acetaminophen, HYDROcodone-acetaminophen, oxybutynin, metoprolol succinate, HYDROcodone-acetaminophen, HYDROcodone-acetaminophen, ALPRAZolam, and meloxicam. I have also changed her HYDROcodone-acetaminophen and pregabalin. Additionally, I am having her start on HYDROcodone-acetaminophen and HYDROcodone-acetaminophen. Lastly, I am having her maintain her albuterol, albuterol, zinc oxide, mupirocin ointment, nystatin cream, diclofenac sodium, estradiol, clobetasol cream, montelukast, Calcium Citrate-Vitamin D (CALCIUM + D PO), hydrochlorothiazide, amLODipine, losartan, Dexlansoprazole, ondansetron, dabigatran, metFORMIN, levothyroxine, fesoterodine, fluticasone-salmeterol, nitrofurantoin (macrocrystal-monohydrate), buPROPion, levocetirizine, and DULoxetine. We will continue to administer ipratropium-albuterol.  Pharmacotherapy (Medications Ordered): Meds ordered this encounter  Medications  . DULoxetine (CYMBALTA) 60 MG capsule    Sig: Take 1 capsule (60 mg total) by mouth daily.    Dispense:  90 capsule    Refill:  0    Do not place this medication, or any other prescription from our practice, on "Automatic Refill". Patient may have prescription filled one day early if pharmacy is closed on scheduled refill date.  [place] HYDROcodone-acetaminophen (NORCO/VICODIN) 5-325 MG tablet    Sig: Take 1 tablet by mouth every 8 (eight) hours as needed for up to 30 days for severe pain. Must last 30 days    Dispense:  90 tablet    Refill:  0    Chronic Pain. (STOP Act - Not  applicable). Fill one day early if closed on scheduled refill date. Do not fill until: 09/06/18. To last until: 10/06/18.  Mar[place] Kitchen pregabalin (LYRICA) 25 MG capsule    Sig: Take 1 capsule (25 mg total) by mouth 2 (two) times daily.    Dispense:  60 capsule    Refill:  2    Do not place this medication, or any other prescription from our practice, on "Automatic Refill". Patient may have prescription filled one day early if pharmacy is closed on scheduled refill date.  Marland Kitchen HYDROcodone-acetaminophen (NORCO/VICODIN) 5-325 MG tablet    Sig: Take 1 tablet by mouth every 8 (eight) hours as needed for up to 30 days for severe pain. Must last 30 days    Dispense:  90 tablet    Refill:  0    Chronic Pain. (STOP Act - Not applicable). Fill one day early if closed on scheduled refill date. Do not fill until: 10/06/18. To last until:11/05/18.  Marland Kitchen HYDROcodone-acetaminophen (NORCO/VICODIN) 5-325 MG tablet    Sig: Take 1 tablet by mouth every 8 (eight) hours as needed for up to 30 days for severe pain. Must last 30 days    Dispense:  90 tablet    Refill:  0    Chronic Pain. (STOP Act - Not applicable). Fill one day early if closed on scheduled refill date. Do not fill until: 11/05/18. To last until: 12/05/18.   Orders:  No orders of the defined types were placed in this encounter.  Follow-up plan:   Return in about 15 weeks (around 11/30/2018) for (3 mo) Med-Mgmt.    I discussed the assessment and treatment plan with the patient. The patient was  provided an opportunity to ask questions and all were answered. The patient agreed with the plan and demonstrated an understanding of the instructions.  Patient advised to call back or seek an in-person evaluation if the symptoms or condition worsens.  Total duration of non-face-to-face encounter: 15 minutes.  Note by: Gaspar Cola, MD Date: 08/17/2018; Time: 9:58 AM  Note: This dictation was prepared with Dragon dictation. Any transcriptional errors that may  result from this process are unintentional.  Disclaimer:  * Given the special circumstances of the COVID-19 pandemic, the federal government has announced that the Office for Civil Rights (OCR) will exercise its enforcement discretion and will not impose penalties on physicians using telehealth in the event of noncompliance with regulatory requirements under the [place] (HIPAA) in connection with the good faith provision of telehealth during the YHCWC-37 national public health emergency. ([place])

## 2018-08-17 ENCOUNTER — Encounter: Payer: PPO | Admitting: Nurse Practitioner

## 2018-08-17 ENCOUNTER — Ambulatory Visit: Payer: PPO | Attending: Nurse Practitioner | Admitting: Pain Medicine

## 2018-08-17 ENCOUNTER — Other Ambulatory Visit: Payer: Self-pay

## 2018-08-17 DIAGNOSIS — M797 Fibromyalgia: Secondary | ICD-10-CM | POA: Diagnosis not present

## 2018-08-17 DIAGNOSIS — G894 Chronic pain syndrome: Secondary | ICD-10-CM | POA: Diagnosis not present

## 2018-08-17 DIAGNOSIS — M8949 Other hypertrophic osteoarthropathy, multiple sites: Secondary | ICD-10-CM

## 2018-08-17 DIAGNOSIS — M7918 Myalgia, other site: Secondary | ICD-10-CM

## 2018-08-17 DIAGNOSIS — E1142 Type 2 diabetes mellitus with diabetic polyneuropathy: Secondary | ICD-10-CM

## 2018-08-17 DIAGNOSIS — G8929 Other chronic pain: Secondary | ICD-10-CM

## 2018-08-17 DIAGNOSIS — L97521 Non-pressure chronic ulcer of other part of left foot limited to breakdown of skin: Secondary | ICD-10-CM | POA: Diagnosis not present

## 2018-08-17 DIAGNOSIS — M150 Primary generalized (osteo)arthritis: Secondary | ICD-10-CM

## 2018-08-17 DIAGNOSIS — M159 Polyosteoarthritis, unspecified: Secondary | ICD-10-CM

## 2018-08-17 DIAGNOSIS — E1143 Type 2 diabetes mellitus with diabetic autonomic (poly)neuropathy: Secondary | ICD-10-CM | POA: Diagnosis not present

## 2018-08-17 MED ORDER — DULOXETINE HCL 60 MG PO CPEP: 60.0000 mg | ORAL_CAPSULE | Freq: Every day | ORAL | 0 refills | Status: DC

## 2018-08-17 MED ORDER — HYDROCODONE-ACETAMINOPHEN 5-325 MG PO TABS: 1.0000 | ORAL_TABLET | Freq: Three times a day (TID) | ORAL | 0 refills | Status: DC | PRN

## 2018-08-17 MED ORDER — PREGABALIN 25 MG PO CAPS: 25.0000 mg | ORAL_CAPSULE | Freq: Two times a day (BID) | ORAL | 2 refills | Status: DC

## 2018-08-17 NOTE — Patient Instructions (Signed)
____________________________________________________________________________________________  Medication Rules  Purpose: To inform patients, and their family members, of our rules and regulations.  Applies to: All patients receiving prescriptions (written or electronic).  Pharmacy of record: Pharmacy where electronic prescriptions will be sent. If written prescriptions are taken to a different pharmacy, please inform the nursing staff. The pharmacy listed in the electronic medical record should be the one where you would like electronic prescriptions to be sent.  Electronic prescriptions: In compliance with the [place] Strengthen Opioid Misuse Prevention (STOP) Act of 2017 (Session Law 2017-74/H243), effective March 23, 2018, all controlled substances must be electronically prescribed. Calling prescriptions to the pharmacy will cease to exist.  Prescription refills: Only during scheduled appointments. Applies to all prescriptions.  NOTE: The following applies primarily to controlled substances (Opioid* Pain Medications).   Patient's responsibilities: 1. Pain Pills: Bring all pain pills to every appointment (except for procedure appointments). 2. Pill Bottles: Bring pills in original pharmacy bottle. Always bring the newest bottle. Bring bottle, even if empty. 3. Medication refills: You are responsible for knowing and keeping track of what medications you take and those you need refilled. The day before your appointment: write a list of all prescriptions that need to be refilled. The day of the appointment: give the list to the admitting nurse. Prescriptions will be written only during appointments. No prescriptions will be written on procedure days. If you forget a medication: it will not be "Called in", "Faxed", or "electronically sent". You will need to get another appointment to get these prescribed. No early refills. Do not call asking to have your prescription filled  early. 4. Prescription Accuracy: You are responsible for carefully inspecting your prescriptions before leaving our office. Have the discharge nurse carefully go over each prescription with you, before taking them home. Make sure that your name is accurately spelled, that your address is correct. Check the name and dose of your medication to make sure it is accurate. Check the number of pills, and the written instructions to make sure they are clear and accurate. Make sure that you are given enough medication to last until your next medication refill appointment. 5. Taking Medication: Take medication as prescribed. When it comes to controlled substances, taking less pills or less frequently than prescribed is permitted and encouraged. Never take more pills than instructed. Never take medication more frequently than prescribed.  6. Inform other Doctors: Always inform, all of your healthcare providers, of all the medications you take. 7. Pain Medication from other Providers: You are not allowed to accept any additional pain medication from any other Doctor or Healthcare provider. There are two exceptions to this rule. (see below) In the event that you require additional pain medication, you are responsible for notifying us, as stated below. 8. Medication Agreement: You are responsible for carefully reading and following our Medication Agreement. This must be signed before receiving any prescriptions from our practice. Safely store a copy of your signed Agreement. Violations to the Agreement will result in no further prescriptions. (Additional copies of our Medication Agreement are available upon request.) 9. Laws, Rules, & Regulations: All patients are expected to follow all Federal and State Laws, Statutes, Rules, & Regulations. Ignorance of the Laws does not constitute a valid excuse. The use of any illegal substances is prohibited. 10. Adopted CDC guidelines & recommendations: Target dosing levels will be  at or below 60 MME/day. Use of benzodiazepines** is not recommended.  Exceptions: There are only two exceptions to the rule of not   receiving pain medications from other Healthcare Providers. 1. Exception #1 (Emergencies): In the event of an emergency (i.e.: accident requiring emergency care), you are allowed to receive additional pain medication. However, you are responsible for: As soon as you are able, call our office [phone], at any time of the day or night, and leave a message stating your name, the date and nature of the emergency, and the name and dose of the medication prescribed. In the event that your call is answered by a member of our staff, make sure to document and save the date, time, and the name of the person that took your information.  2. Exception #2 (Planned Surgery): In the event that you are scheduled by another doctor or dentist to have any type of surgery or procedure, you are allowed (for a period no longer than 30 days), to receive additional pain medication, for the acute post-op pain. However, in this case, you are responsible for picking up a copy of our "Post-op Pain Management for Surgeons" handout, and giving it to your surgeon or dentist. This document is available at our office, and does not require an appointment to obtain it. Simply go to our office during business hours (Monday-Thursday from 8:00 AM to 4:00 PM) (Friday 8:00 AM to 12:00 Noon) or if you have a scheduled appointment with us, prior to your surgery, and ask for it by name. In addition, you will need to provide us with your name, name of your surgeon, type of surgery, and date of procedure or surgery.  *Opioid medications include: morphine, codeine, oxycodone, oxymorphone, hydrocodone, hydromorphone, meperidine, tramadol, tapentadol, buprenorphine, fentanyl, methadone. **Benzodiazepine medications include: diazepam (Valium), alprazolam (Xanax), clonazepam (Klonopine), lorazepam (Ativan), clorazepate  (Tranxene), chlordiazepoxide (Librium), estazolam (Prosom), oxazepam (Serax), temazepam (Restoril), triazolam (Halcion) (Last updated: 05/20/2017) ____________________________________________________________________________________________   ____________________________________________________________________________________________  Medication Recommendations and Reminders  Applies to: All patients receiving prescriptions (written and/or electronic).  Medication Rules & Regulations: These rules and regulations exist for your safety and that of others. They are not flexible and neither are we. Dismissing or ignoring them will be considered "non-compliance" with medication therapy, resulting in complete and irreversible termination of such therapy. (See document titled "Medication Rules" for more details.) In all conscience, because of safety reasons, we cannot continue providing a therapy where the patient does not follow instructions.  Pharmacy of record:   Definition: This is the pharmacy where your electronic prescriptions will be sent.   We do not endorse any particular pharmacy.  You are not restricted in your choice of pharmacy.  The pharmacy listed in the electronic medical record should be the one where you want electronic prescriptions to be sent.  If you choose to change pharmacy, simply notify our nursing staff of your choice of new pharmacy.  Recommendations:  Keep all of your pain medications in a safe place, under lock and key, even if you live alone.   After you fill your prescription, take 1 week's worth of pills and put them away in a safe place. You should keep a separate, properly labeled bottle for this purpose. The remainder should be kept in the original bottle. Use this as your primary supply, until it runs out. Once it's gone, then you know that you have 1 week's worth of medicine, and it is time to come in for a prescription refill. If you do this correctly, it  is unlikely that you will ever run out of medicine.  To make sure that the above recommendation works,   it is very important that you make sure your medication refill appointments are scheduled at least 1 week before you run out of medicine. To do this in an effective manner, make sure that you do not leave the office without scheduling your next medication management appointment. Always ask the nursing staff to show you in your prescription , when your medication will be running out. Then arrange for the receptionist to get you a return appointment, at least 7 days before you run out of medicine. Do not wait until you have 1 or 2 pills left, to come in. This is very poor planning and does not take into consideration that we may need to cancel appointments due to bad weather, sickness, or emergencies affecting our staff.  "Partial Fill": If for any reason your pharmacy does not have enough pills/tablets to completely fill or refill your prescription, do not allow for a "partial fill". You will need a separate prescription to fill the remaining amount, which we will not provide. If the reason for the partial fill is your insurance, you will need to talk to the pharmacist about payment alternatives for the remaining tablets, but again, do not accept a partial fill.  Prescription refills and/or changes in medication(s):   Prescription refills, and/or changes in dose or medication, will be conducted only during scheduled medication management appointments. (Applies to both, written and electronic prescriptions.)  No refills on procedure days. No medication will be changed or started on procedure days. No changes, adjustments, and/or refills will be conducted on a procedure day. Doing so will interfere with the diagnostic portion of the procedure.  No phone refills. No medications will be "called into the pharmacy".  No Fax refills.  No weekend refills.  No [place] refills.  No after hours  refills.  Remember:  Business hours are:  Monday to Thursday 8:00 AM to 4:00 PM Provider's Schedule: Crystal King, NP - Appointments are:  Medication management: Monday to Thursday 8:00 AM to 4:00 PM Atlantis Delong, MD - Appointments are:  Medication management: Monday and Wednesday 8:00 AM to 4:00 PM Procedure day: Tuesday and Thursday 7:30 AM to 4:00 PM Bilal Lateef, MD - Appointments are:  Medication management: Tuesday and Thursday 8:00 AM to 4:00 PM Procedure day: Monday and Wednesday 7:30 AM to 4:00 PM (Last update: 05/20/2017) ____________________________________________________________________________________________   ____________________________________________________________________________________________  CANNABIDIOL (AKA: CBD Oil or Pills)  Applies to: All patients receiving prescriptions of controlled substances (written and/or electronic).  General Information: Cannabidiol (CBD) was discovered in 1940. It is one of some 113 identified cannabinoids in cannabis (Marijuana) plants, accounting for up to 40% of the plant's extract. As of 2018, preliminary clinical research on cannabidiol included studies of anxiety, cognition, movement disorders, and pain.  Cannabidiol is consummed in multiple ways, including inhalation of cannabis smoke or vapor, as an aerosol spray into the cheek, and by mouth. It may be supplied as CBD oil containing CBD as the active ingredient (no added tetrahydrocannabinol (THC) or terpenes), a full-plant CBD-dominant hemp extract oil, capsules, dried cannabis, or as a liquid solution. CBD is thought not have the same psychoactivity as THC, and may affect the actions of THC. Studies suggest that CBD may interact with different biological targets, including cannabinoid receptors and other neurotransmitter receptors. As of 2018 the mechanism of action for its biological effects has not been determined.  In the [place], cannabidiol has a limited  approval by the Food and Drug Administration (FDA) for treatment of only two types   of epilepsy disorders. The side effects of long-term use of the drug include somnolence, decreased appetite, diarrhea, fatigue, malaise, weakness, sleeping problems, and others.  CBD remains a Schedule I drug prohibited for any use.  Legality: Some manufacturers ship CBD products nationally, an illegal action which the FDA has not enforced in 2018, with CBD remaining the subject of an FDA investigational new drug evaluation, and is not considered legal as a dietary supplement or food ingredient as of December 2018. Federal illegality has made it difficult historically to conduct research on CBD. CBD is openly sold in head shops and health food stores in some states where such sales have not been explicitly legalized.  Warning: Because it is not FDA approved for general use or treatment of pain, it is not required to undergo the same manufacturing controls as prescription drugs.  This means that the available cannabidiol (CBD) may be contaminated with THC.  If this is the case, it will trigger a positive urine drug screen (UDS) test for cannabinoids (Marijuana).  Because a positive UDS for illicit substances is a violation of our medication agreement, your opioid analgesics (pain medicine) may be permanently discontinued. (Last update: 06/10/2017) ____________________________________________________________________________________________    

## 2018-08-18 ENCOUNTER — Ambulatory Visit: Payer: Self-pay

## 2018-08-18 NOTE — Telephone Encounter (Signed)
Patient was informed.  Patient understood and no questions, comments, or concerns at this time.  Patient states she  is still taking the oxybutynin.

## 2018-08-19 ENCOUNTER — Other Ambulatory Visit: Payer: Self-pay

## 2018-08-19 ENCOUNTER — Other Ambulatory Visit: Payer: Self-pay | Admitting: *Deleted

## 2018-08-19 ENCOUNTER — Ambulatory Visit
Admission: RE | Admit: 2018-08-19 | Discharge: 2018-08-19 | Disposition: A | Discharge status: Home / Self Care | Payer: PPO | Source: Ambulatory Visit | Attending: Gastroenterology | Admitting: Gastroenterology

## 2018-08-19 DIAGNOSIS — R1310 Dysphagia, unspecified: Secondary | ICD-10-CM | POA: Diagnosis not present

## 2018-08-19 DIAGNOSIS — K449 Diaphragmatic hernia without obstruction or gangrene: Secondary | ICD-10-CM | POA: Diagnosis not present

## 2018-08-19 NOTE — Patient Outreach (Signed)
[facility]) Care Management  08/19/2018  Aritha Huckeba 08-13-1950 [id number]   CSW made a second attempt to try and contact patient today to perform phone assessment, as well as assess and assist with social work needs and services, without success.  A HIPAA compliant message was left for patient on voicemail.  CSW continues to await a return call.  CSW will make a third and final outreach attempt within the next 3-4 business days, if a return call is not received from patient in the meantime.  CSW will then proceed with case closure if a return call is not received from patient with a total of 10 business days, as required number of phone attempts will have been made and outreach letter mailed.   [name], BSW, MSW, LCSW  Licensed Education officer, environmental Health System  Mailing [place] [address] [place], [place] [address] Physical Address-[address], [place], [place] [address] Toll Free Main # [phone] Fax # [phone] Cell # [phone]  Office # [phone] [email or web address]

## 2018-08-24 DIAGNOSIS — E1143 Type 2 diabetes mellitus with diabetic autonomic (poly)neuropathy: Secondary | ICD-10-CM | POA: Diagnosis not present

## 2018-08-24 DIAGNOSIS — L97521 Non-pressure chronic ulcer of other part of left foot limited to breakdown of skin: Secondary | ICD-10-CM | POA: Diagnosis not present

## 2018-08-25 ENCOUNTER — Encounter: Payer: Self-pay | Admitting: *Deleted

## 2018-08-25 ENCOUNTER — Other Ambulatory Visit: Payer: Self-pay | Admitting: *Deleted

## 2018-08-25 NOTE — Patient Outreach (Signed)
[facility]) Care Management  08/25/2018  [name] [birth year] [id number]   CSW was able to make initial contact with patient today to perform phone assessment, as well as assess and assist with social work needs and services.  CSW introduced self, explained role and types of services provided through [facility] ([facility]).  CSW further explained to patient that CSW works with patient's Telephonic RNCM, also with [name] Management, [name]. CSW then explained the reason for the call, indicating that Mrs. [name] thought that patient would benefit from social work services and resources to assist with counseling and supportive services for symptoms of depression.  CSW obtained two HIPAA compliant identifiers from patient, which included patient's name and date of birth.  Patient denied experiencing symptoms of depression today, admitting that she is just extremely frustrated.  Patient went on to say, "I have walked in [name] [place] boot for two years and I am just sick of it".  Patient indicated that she has always been extremely active (cooking, baking, working in her flower beds, etc.), but has been restricted for the past several years due to [name] ulcer that she has on the heel of her left foot that refuses to heal.  Patient stated, "I'm tired of delegating responsibilities to my husband and granddaughter, I would much rather be able to do for myself".  Patient reported that she experiences excruciating pain when she tries to bare weight on her foot.  CSW is aware that patient attends a pain management clinic every three months to try and relieve pain symptoms.    Patient is fearful of losing her left foot, due to a "massive infection" that patient believes will never heal due to Diabetes.  Patient indicated that she had to have her acles tendon removed, due to the infection, which is now causing patient a great deal of balance issues.  Patient  has a significant history of falls, despite using a rolling walker to assist with ambulation.  Patient reported that she is scheduled to see a Psychiatric nurse, [name] at [facility], on Tuesday, September 13, 2018 at 1:45PM.  Patient is extremely hopeful that [name] will be able to perform some type of procedure that will allow her to be able to remove the una boot, ease her pain and enable her to be able to ambulate without assistance.  Patient talked a lot about her family dynamics, reporting that she regained custody of her 25 year old granddaughter back in March of 2020, for the 2nd time, in 3 years.  Patient indicated that this has caused a great deal of turmoil between her and her daughter.  Patient admitted to spending a great deal of time worrying about her relationship with her daughter and granddaughter, but stated, "I have decided that I'm just not going to worry about it anymore because I know I am doing the right thing by my granddaughter". Patient indicated that she relies heavily on her faith and tries to read the Bible when she becomes overwhelmed with negative thoughts and feelings.  Patient is already aware of proper use of deep breathing exercises and relaxation techniques and relies on these exercises and techniques often.    CSW was able to offer counseling and supportive services to patient today, which included Cognitive Behavioral Therapy and Client-Centered Therapy.  Patient denies the need for ongoing counseling services, nor was patient interested in receiving a list of counselors/therapists in  [facility] that accepts [employer].  Patient also declined wanting to receive EMMI information pertaining specifically to depression, indicating that she "already knows everything she needs to know, having lived through it".  Patient admits to being optimistic about the future and reported that she will not allow herself  to "be down for long".  Patient admits to taking her antidepressant medications, Wellbutrin XL 150 MG PO daily and Cymbalta 60 MG PO daily, exactly as prescribed.    Per patient's request, CSW agreed to follow-up with patient again on Wednesday, September 14, 2018, around 9:00AM, to have patient report findings regarding her appointment with [name].  Patient stated, "If I don't receive good news then I may need counseling for depression".  CSW was able to confirm that patient has the correct contact information for CSW, encouraging patient to contact CSW directly if additional social work needs arise in the meantime. Patient voiced understanding and was agreeable to this plan.     [name], BSW, MSW, LCSW  Licensed Education officer, environmental Health System  Mailing [place] [address] [place], [place] [address] Physical Address-[address], [place], [place] [address] Toll Free Main # [phone] Fax # [phone] Cell # [phone]  Office # [phone] [email or web address]

## 2018-08-27 IMAGING — DX DG CHEST 2V
2 series · 2 of 2 positions shown · non-contrast
Comparison: 11/06/2015

CLINICAL DATA: Lobe O2 sats, URI symptom

EXAM:
CHEST  2 VIEW

[chest pa]
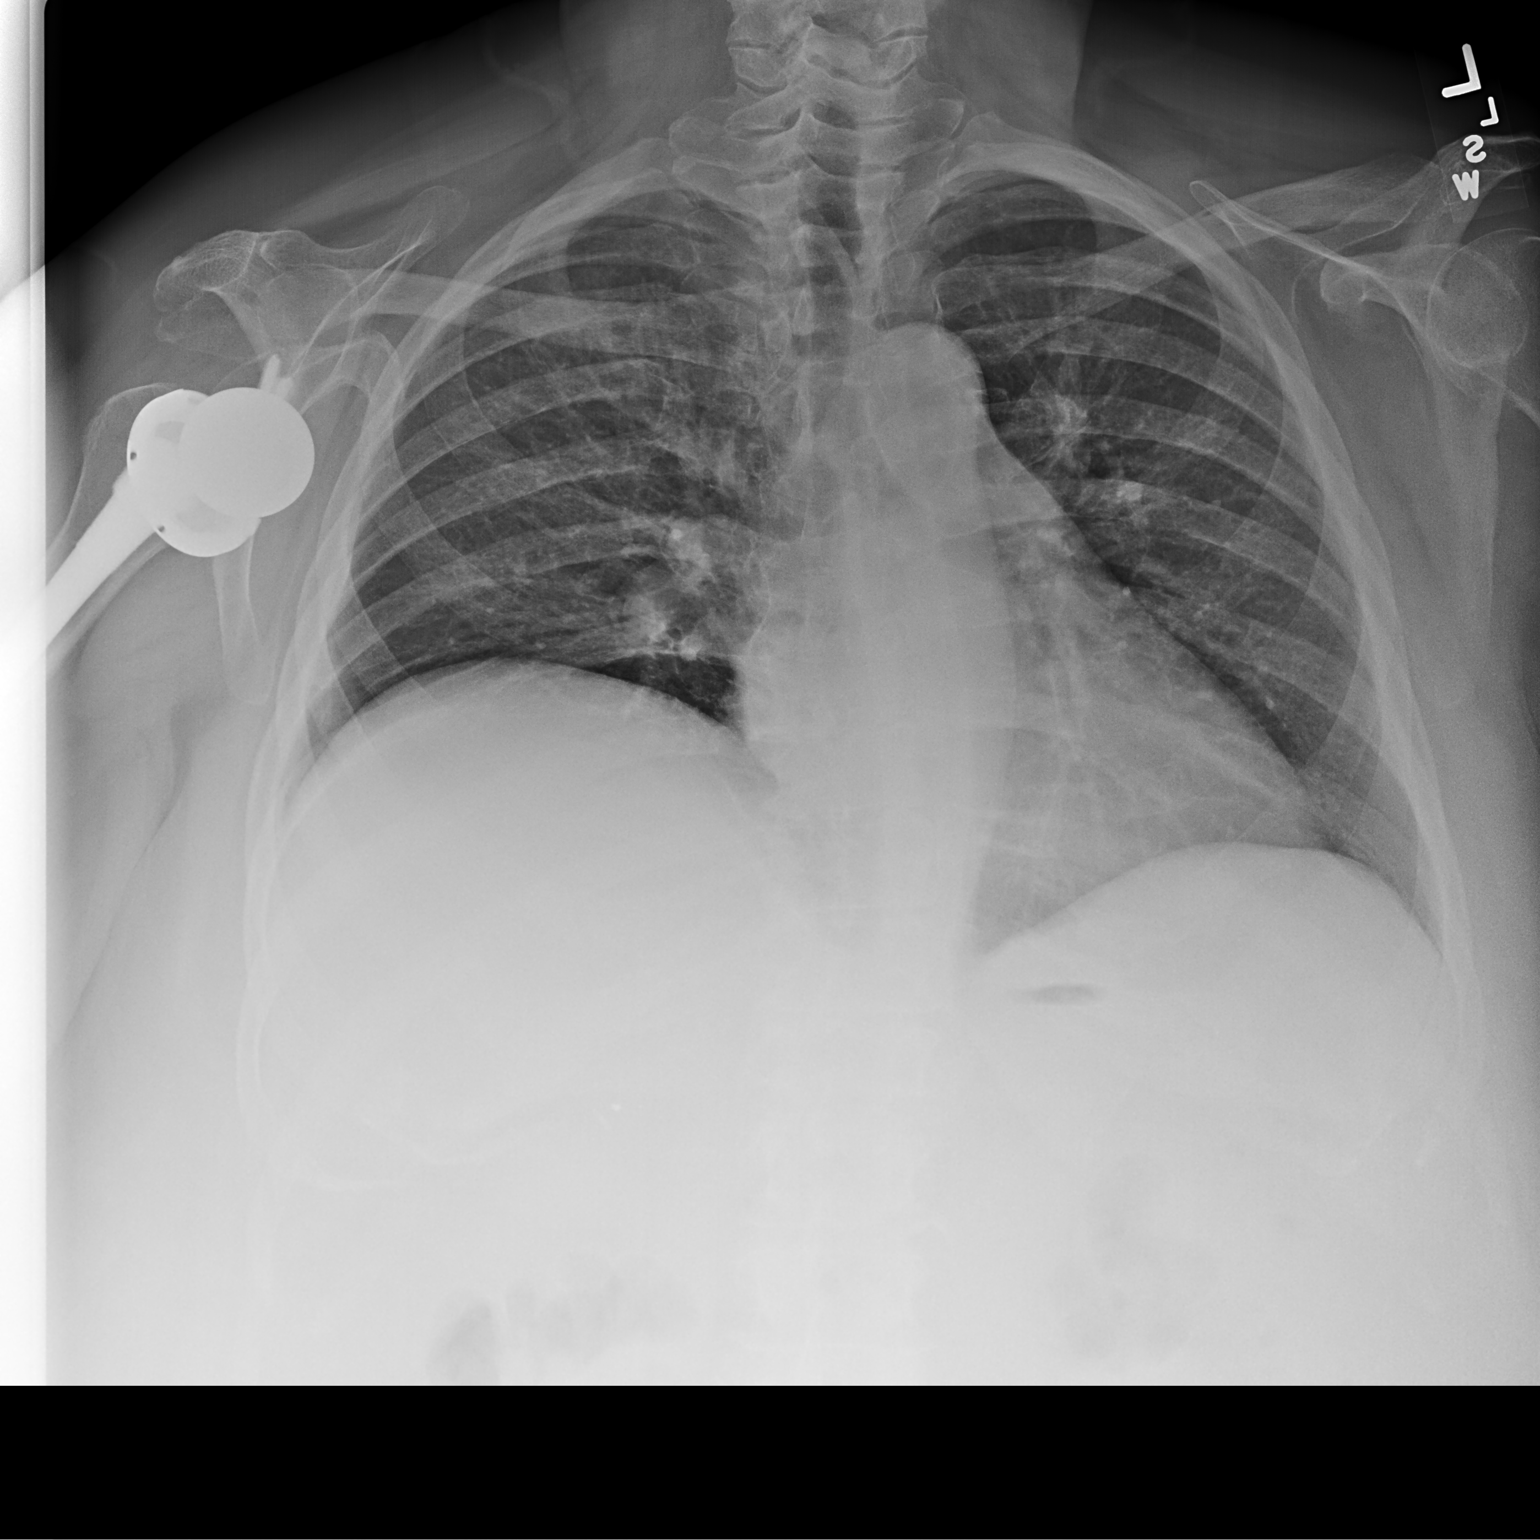

[chest lat]
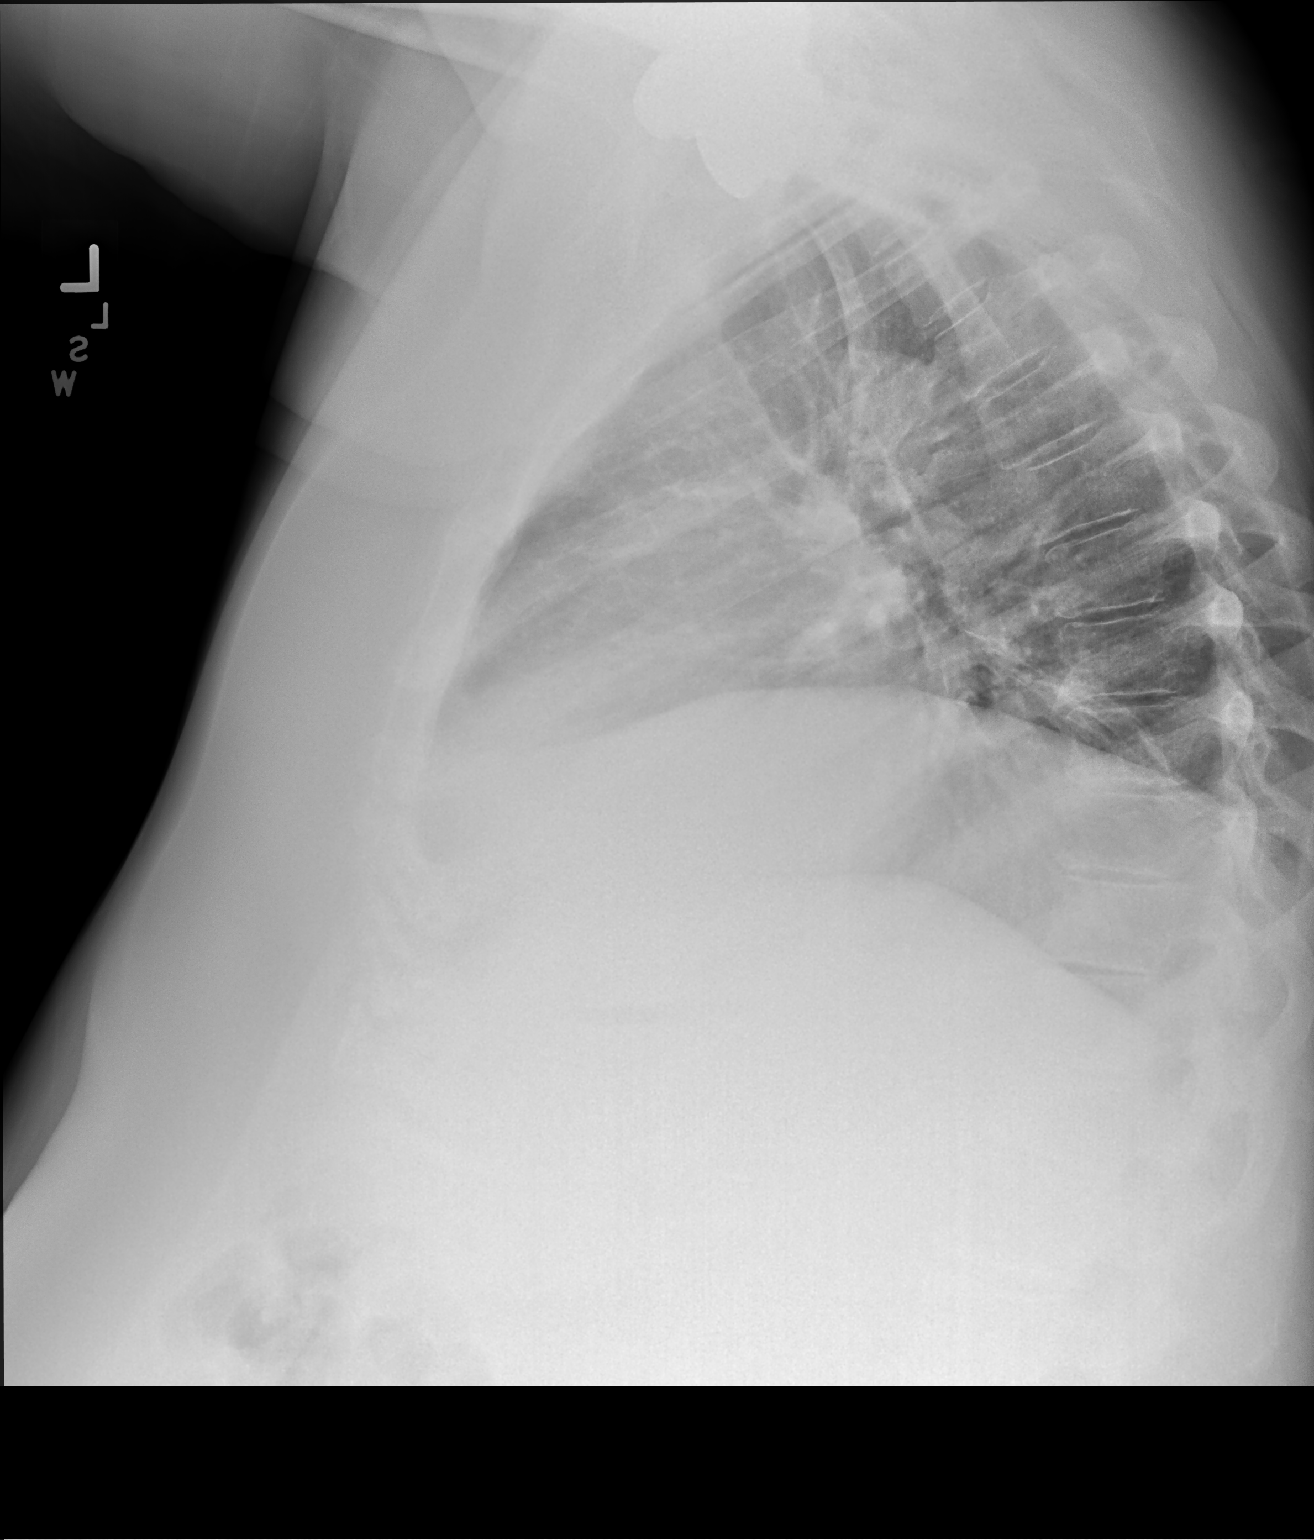

[2 of 2 positions shown; findings below may reference images not displayed]

FINDINGS: Status post right shoulder replacement. Elevated right diaphragm
unchanged. No acute consolidation or pleural effusion. Stable
cardiomediastinal silhouette. No pneumothorax. Surgical clips in the
right upper quadrant.
IMPRESSION: No radiographic evidence for acute cardiopulmonary abnormality.

## 2018-08-30 ENCOUNTER — Ambulatory Visit: Payer: PPO

## 2018-08-31 ENCOUNTER — Other Ambulatory Visit: Payer: Self-pay | Admitting: Pain Medicine

## 2018-08-31 DIAGNOSIS — L97521 Non-pressure chronic ulcer of other part of left foot limited to breakdown of skin: Secondary | ICD-10-CM | POA: Diagnosis not present

## 2018-08-31 DIAGNOSIS — G894 Chronic pain syndrome: Secondary | ICD-10-CM

## 2018-08-31 DIAGNOSIS — G8929 Other chronic pain: Secondary | ICD-10-CM

## 2018-08-31 DIAGNOSIS — M7918 Myalgia, other site: Secondary | ICD-10-CM

## 2018-08-31 DIAGNOSIS — M797 Fibromyalgia: Secondary | ICD-10-CM

## 2018-08-31 DIAGNOSIS — E1143 Type 2 diabetes mellitus with diabetic autonomic (poly)neuropathy: Secondary | ICD-10-CM | POA: Diagnosis not present

## 2018-08-31 DIAGNOSIS — E1142 Type 2 diabetes mellitus with diabetic polyneuropathy: Secondary | ICD-10-CM

## 2018-09-02 ENCOUNTER — Other Ambulatory Visit: Payer: Self-pay

## 2018-09-06 ENCOUNTER — Telehealth: Payer: Self-pay | Admitting: Urology

## 2018-09-06 DIAGNOSIS — N390 Urinary tract infection, site not specified: Secondary | ICD-10-CM

## 2018-09-06 NOTE — Telephone Encounter (Signed)
Spoke with patient and asked her if she could drop off a urine sample to send for culture and check for infection. Patient is unable to come today but will come tomorrow she was added to the lab schedule. Orders placed

## 2018-09-06 NOTE — Telephone Encounter (Signed)
Pt feels like she has another UTI,she is having some painful urination and also uncontrolled urination. Please advise.

## 2018-09-07 ENCOUNTER — Other Ambulatory Visit: Payer: Self-pay

## 2018-09-07 ENCOUNTER — Telehealth: Payer: Self-pay

## 2018-09-07 ENCOUNTER — Ambulatory Visit: Payer: PPO | Admitting: Pulmonary Disease

## 2018-09-07 ENCOUNTER — Ambulatory Visit: Payer: PPO

## 2018-09-07 NOTE — Telephone Encounter (Signed)
Called patient for scheduled annual wellness visit via phone call with Health Coach today at 930.  No answer.  No voice mail set up. Unable to leave a message.

## 2018-09-08 ENCOUNTER — Other Ambulatory Visit: Payer: Self-pay

## 2018-09-12 DIAGNOSIS — E1143 Type 2 diabetes mellitus with diabetic autonomic (poly)neuropathy: Secondary | ICD-10-CM | POA: Diagnosis not present

## 2018-09-12 DIAGNOSIS — L97521 Non-pressure chronic ulcer of other part of left foot limited to breakdown of skin: Secondary | ICD-10-CM | POA: Diagnosis not present

## 2018-09-13 ENCOUNTER — Other Ambulatory Visit: Payer: Self-pay | Admitting: Urology

## 2018-09-13 ENCOUNTER — Institutional Professional Consult (permissible substitution): Payer: PPO | Admitting: Plastic Surgery

## 2018-09-14 ENCOUNTER — Other Ambulatory Visit: Payer: Self-pay | Admitting: Internal Medicine

## 2018-09-14 ENCOUNTER — Encounter: Payer: Self-pay | Admitting: *Deleted

## 2018-09-14 ENCOUNTER — Ambulatory Visit (INDEPENDENT_AMBULATORY_CARE_PROVIDER_SITE_OTHER): Payer: PPO | Admitting: Internal Medicine

## 2018-09-14 ENCOUNTER — Other Ambulatory Visit: Payer: Self-pay

## 2018-09-14 ENCOUNTER — Telehealth: Payer: Self-pay | Admitting: *Deleted

## 2018-09-14 ENCOUNTER — Other Ambulatory Visit: Payer: Self-pay | Admitting: *Deleted

## 2018-09-14 DIAGNOSIS — Z20828 Contact with and (suspected) exposure to other viral communicable diseases: Secondary | ICD-10-CM

## 2018-09-14 DIAGNOSIS — Z20822 Contact with and (suspected) exposure to covid-19: Secondary | ICD-10-CM

## 2018-09-14 DIAGNOSIS — Z6841 Body Mass Index (BMI) 40.0 and over, adult: Secondary | ICD-10-CM

## 2018-09-14 DIAGNOSIS — L97523 Non-pressure chronic ulcer of other part of left foot with necrosis of muscle: Secondary | ICD-10-CM

## 2018-09-14 DIAGNOSIS — E278 Other specified disorders of adrenal gland: Secondary | ICD-10-CM

## 2018-09-14 DIAGNOSIS — J4540 Moderate persistent asthma, uncomplicated: Secondary | ICD-10-CM

## 2018-09-14 DIAGNOSIS — M1990 Unspecified osteoarthritis, unspecified site: Secondary | ICD-10-CM

## 2018-09-14 DIAGNOSIS — M069 Rheumatoid arthritis, unspecified: Secondary | ICD-10-CM

## 2018-09-14 MED ORDER — MELOXICAM 15 MG PO TABS: 15.0000 mg | ORAL_TABLET | Freq: Every day | ORAL | 2 refills | Status: DC | PRN

## 2018-09-14 NOTE — Progress Notes (Signed)
Teleophone Note  I connected with [name]  on 09/14/18 at  3:13 PM EDT by a telephone application and verified that I am speaking with the correct person using two identifiers.  Location patient: home Location provider:work  Persons participating in the virtual visit: patient, provider, pts husband   I discussed the limitations of evaluation and management by telemedicine and the availability of in person appointments. The patient expressed understanding and agreed to proceed.   HPI: 1. H/o asthma/bronchitis and Monday, Tuesday, and worse today c/o hoarseness, cough and sore throat. She thinks she is hoarse 2/2 cough. She has felt bad x 2 days with also cough with green phelgm and green nasal discharge. Through [facility] she has COVID 19 testing sch 09/19/18 due to c/w covid exposure her live in grand daughter just came back from [facility] and she isnt wearing a mask and did not quarantine for 14 days due to recent COVID 19 outbreak at [employer] No fever, body aches. She reports she feels bad and fatigue with other sx's above. She has used duoneb to help w/o relief. She is also on Advair and recently spiriva was stopped   -pfts scheduled 09/22/18 and will f/u [facility]   2. Chronic wound left heel she will f/u with plastic surgery 10/13/18   ROS: See pertinent positives and negatives per HPI.  Past Medical History:  Diagnosis Date  . Abnormal antibody titer   . Anxiety and depression   . Arthritis   . Asthma   . Cystocele   . Depression   . Diabetes ([place])   . DVT (deep venous thrombosis) (HCC)    Righ calf  . Dysrhythmia   . Edema   . GERD (gastroesophageal reflux disease)   . Heart murmur    Echocardiogram June 2015: Mild MR (possible vegetation seen on TTE, not seen on TEE), normal LV size with moderate concentric LVH. Normal function EF 60-65%. Normal diastolic function. Mild LA dilation.  Marland Kitchen History of IBS   . History of methicillin resistant staphylococcus aureus  (MRSA) 2008  . Hyperlipidemia   . Hypertension   . Hypothyroidism   . Insomnia   . Kidney stone    kidney stones with lithotripsy  . Neuropathy involving both lower extremities   . Obesity, Class II, BMI 35-39.9, with comorbidity   . Paroxysmal atrial fibrillation ([place]) 2007   In June 2015, Cardiac Event Monitor: Mostly SR/sinus arrhythmia with PVCs that are frequent. Short bursts of A. fib lasting several minutes;; CHA2DS2-VASc Score = 5 (age, Female, PVD, DM, HTN)  . Peripheral vascular disease ([place])   . Sleep apnea    use C-PAP  . Urinary incontinence   . Venous stasis dermatitis of both lower extremities     Past Surgical History:  Procedure Laterality Date  . ANKLE SURGERY Right   . APPLICATION OF WOUND VAC Left 06/27/2015   Procedure: APPLICATION OF WOUND VAC ( POSSIBLE ) ;  Surgeon: [name], MD;  Location: [facility] ORS;  Service: Vascular;  Laterality: Left;  . APPLICATION OF WOUND VAC Left 09/25/2016   Procedure: APPLICATION OF WOUND VAC;  Surgeon: [name], DPM;  Location: [facility] ORS;  Service: Podiatry;  Laterality: Left;  . arm surgery     right    . CHOLECYSTECTOMY    . COLONOSCOPY    . COLONOSCOPY WITH PROPOFOL N/A 01/11/2017   Procedure: COLONOSCOPY WITH PROPOFOL;  Surgeon: [name], MD;  Location[facility] END[facility]COPY;  Service: Endoscopy;  Laterality: N/A;  . COLONOSCOPY WITH PROPOFOL N/A 02/22/2017   Procedure: COLONOSCOPY WITH PROPOFOL;  Surgeon: [name], MD;  Location: [facility] ENDOSCOPY;  Service: Endoscopy;  Laterality: N/A;  . COLONOSCOPY WITH PROPOFOL N/A 05/31/2017   Procedure: COLONOSCOPY WITH PROPOFOL;  Surgeon: [name], MD;  Location: [facility] ENDOSCOPY;  Service: Endoscopy;  Laterality: N/A;  . ESOPHAGOGASTRODUODENOSCOPY (EGD) WITH PROPOFOL N/A 01/11/2017   Procedure: ESOPHAGOGASTRODUODENOSCOPY (EGD) WITH PROPOFOL;  Surgeon: [name], MD;  Location: [facility] ENDOSCOPY;  Service: Endoscopy;  Laterality: N/A;  . HERNIA REPAIR      umbilical  . HIATAL HERNIA REPAIR    . I&D EXTREMITY Left 06/27/2015   Procedure: IRRIGATION AND DEBRIDEMENT EXTREMITY            ( CALF HEMATOMA ) POSSIBLE WOUND VAC;  Surgeon: [name], MD;  Location: [facility] ORS;  Service: Vascular;  Laterality: Left;  . INCISION AND DRAINAGE OF WOUND Left 09/25/2016   Procedure: IRRIGATION AND DEBRIDEMENT - PARTIAL RESECTION OF ACHILLES TENDON WITH WOUND VAC APPLICATION;  Surgeon: [name], DPM;  Location: [facility] ORS;  Service: Podiatry;  Laterality: Left;  . IRRIGATION AND DEBRIDEMENT ABSCESS Left 09/07/2016   Procedure: IRRIGATION AND DEBRIDEMENT ABSCESS LEFT HEEL;  Surgeon: [name], DPM;  Location: [facility] ORS;  Service: Podiatry;  Laterality: Left;  . JOINT REPLACEMENT  2013   left knee replacement  . KIDNEY SURGERY Right    kidney stones  . LITHOTRIPSY    . LOWER EXTREMITY ANGIOGRAPHY Left 04/04/2018   Procedure: LOWER EXTREMITY ANGIOGRAPHY;  Surgeon: [name], MD;  Location: [facility];  Service: Cardiovascular;  Laterality: Left;  . NM GATED MYOVIEW ([place] HX)  February 2017   Likely breast attenuation. LOW RISK study. Normal EF 55-60%.  . OTHER SURGICAL HISTORY     08/2016 or 09/2016 surgery on achilles tenso h/o staph infection heal. Dr. [name]  . removal of left hematoma Left    leg  . REVERSE SHOULDER ARTHROPLASTY Right 10/08/2015   Procedure: REVERSE SHOULDER ARTHROPLASTY;  Surgeon: [name], MD;  Location: [facility] ORS;  Service: Orthopedics;  Laterality: Right;  . SLEEVE GASTROPLASTY    . TONSILLECTOMY    . TOTAL KNEE ARTHROPLASTY    . TOTAL SHOULDER REPLACEMENT Right   . TRANSESOPHAGEAL ECHOCARDIOGRAM  08/08/2013   Mild LVH, EF 60-65%. Moderate LA dilation and mild RA dilation. Mild MR with no evidence of stenosis and no evidence of endocarditis. A false chordae is noted.  . TRANSTHORACIC ECHOCARDIOGRAM  08/03/2013   Mild-Moderate concentric LVH, EF 60-65%. Normal diastolic function. Mild LA dilation. Mild MR with  possible vegetation  - not confirmed on TEE   . ULNAR NERVE TRANSPOSITION Right 08/06/2016   Procedure: ULNAR NERVE DECOMPRESSION/TRANSPOSITION;  Surgeon: [name], MD;  Location: [facility] ORS;  Service: Orthopedics;  Laterality: Right;  . UPPER GI ENDOSCOPY    . VAGINAL HYSTERECTOMY      Family History  Problem Relation Age of Onset  . Skin cancer Father   . Diabetes Father   . Hypertension Father   . Peripheral vascular disease Father   . Cancer Father   . Cerebral aneurysm Father   . Alcohol abuse Father   . Varicose Veins Mother   . Kidney disease Mother   . Arthritis Mother   . Mental illness Sister   . Cancer Maternal Aunt        breast  . Breast cancer Maternal Aunt   . Arthritis Maternal  Grandmother   . Hypertension Maternal Grandmother   . Diabetes Maternal Grandmother   . Arthritis Maternal Grandfather   . Heart disease Maternal Grandfather   . Hypertension Maternal Grandfather   . Arthritis Paternal Grandmother   . Hypertension Paternal Grandmother   . Diabetes Paternal Grandmother   . Arthritis Paternal Grandfather   . Hypertension Paternal Grandfather   . Bladder Cancer Neg Hx   . Kidney cancer Neg Hx     SOCIAL HX: married lives with husband and grand daughter    Current Outpatient Medications:  .  albuterol (PROVENTIL HFA;VENTOLIN HFA) 108 (90 Base) MCG/ACT inhaler, Inhale 1-2 puffs into the lungs every 4 (four) hours as needed for wheezing or shortness of breath. (Patient taking differently: Inhale 2 puffs into the lungs every 4 (four) hours as needed for wheezing or shortness of breath. ), Disp: 8 g, Rfl: 2 .  albuterol (PROVENTIL) (2.5 MG/3ML) 0.083% nebulizer solution, Take 3 mLs (2.5 mg total) by nebulization every 6 (six) hours as needed for wheezing or shortness of breath., Disp: 150 mL, Rfl: 1 .  amLODipine (NORVASC) 5 MG tablet, Take 1 tablet (5 mg total) by mouth daily., Disp: 90 tablet, Rfl: 3 .  buPROPion (WELLBUTRIN XL) 150 MG 24 hr tablet,  Take 1 tablet (150 mg total) by mouth daily., Disp: 90 tablet, Rfl: 3 .  Calcium Citrate-Vitamin D (CALCIUM + D PO), Take 1 tablet by mouth 2 (two) times daily., Disp: , Rfl:  .  clobetasol cream (TEMOVATE) 7.82 %, Apply 1 application topically 2 (two) times daily. To arms (Patient taking differently: Apply 1 application topically 2 (two) times daily as needed (irritation). To arms), Disp: 60 g, Rfl: 1 .  dabigatran (PRADAXA) 150 MG CAPS capsule, Take 1 capsule (150 mg total) by mouth 2 (two) times daily., Disp: 180 capsule, Rfl: 1 .  Dexlansoprazole (DEXILANT) 30 MG capsule, Take 1 capsule (30 mg total) by mouth daily. Note lower maintenance dose take 30 min before food lunch or dinner not with thyroid medication (Patient taking differently: Take 60 mg by mouth daily. Note lower maintenance dose take 30 min before food lunch or dinner not with thyroid medication), Disp: 90 capsule, Rfl: 3 .  diclofenac sodium (VOLTAREN) 1 % GEL, Apply 1 application topically daily as needed (pain). , Disp: , Rfl:  .  DULoxetine (CYMBALTA) 60 MG capsule, Take 1 capsule (60 mg total) by mouth daily., Disp: 90 capsule, Rfl: 0 .  estradiol (ESTRACE) 0.1 MG/GM vaginal cream, Apply 0.5mg  (pea-sized amount)  just inside the vaginal introitus with a finger-tip on Monday, Wednesday and Friday nights,, Disp: 42.5 g, Rfl: 11 .  fesoterodine (TOVIAZ) 8 MG TB24 tablet, Take 1 tablet (8 mg total) by mouth daily., Disp: 90 tablet, Rfl: 3 .  fluticasone-salmeterol (ADVAIR HFA) 115-21 MCG/ACT inhaler, Inhale 2 puffs into the lungs daily. Rinse mouth thoroughly after use, Disp: 1 Inhaler, Rfl: 12 .  hydrochlorothiazide (HYDRODIURIL) 12.5 MG tablet, Take 1 tablet (12.5 mg total) by mouth daily. In am, Disp: 90 tablet, Rfl: 3 .  HYDROcodone-acetaminophen (NORCO/VICODIN) 5-325 MG tablet, Take 1 tablet by mouth every 8 (eight) hours as needed for up to 30 days for severe pain. Must last 30 days, Disp: 90 tablet, Rfl: 0 .  [START ON  10/06/2018] HYDROcodone-acetaminophen (NORCO/VICODIN) 5-325 MG tablet, Take 1 tablet by mouth every 8 (eight) hours as needed for up to 30 days for severe pain. Must last 30 days, Disp: 90 tablet, Rfl: 0 .  [START  ON 11/05/2018] HYDROcodone-acetaminophen (NORCO/VICODIN) 5-325 MG tablet, Take 1 tablet by mouth every 8 (eight) hours as needed for up to 30 days for severe pain. Must last 30 days, Disp: 90 tablet, Rfl: 0 .  levocetirizine (XYZAL) 5 MG tablet, Take 1 tablet (5 mg total) by mouth every evening., Disp: 90 tablet, Rfl: 3 .  levothyroxine (SYNTHROID, LEVOTHROID) 175 MCG tablet, Take 1 tablet (175 mcg total) by mouth daily before breakfast., Disp: 90 tablet, Rfl: 3 .  losartan (COZAAR) 50 MG tablet, Take 1 tablet (50 mg total) by mouth daily. Do not take quinapril 40, Disp: 90 tablet, Rfl: 3 .  meloxicam (MOBIC) 15 MG tablet, Take 1 tablet (15 mg total) by mouth daily as needed for pain., Disp: 30 tablet, Rfl: 2 .  metFORMIN (GLUCOPHAGE) 1000 MG tablet, Take 1 tablet (1,000 mg total) by mouth 2 (two) times daily. With food, Disp: 180 tablet, Rfl: 3 .  montelukast (SINGULAIR) 10 MG tablet, Take 1 tablet (10 mg total) by mouth daily., Disp: 90 tablet, Rfl: 3 .  mupirocin ointment (BACTROBAN) 2 %, Apply 1 application topically 2 (two) times daily. Skin wounds, Disp: 30 g, Rfl: 0 .  nitrofurantoin, macrocrystal-monohydrate, (MACROBID) 100 MG capsule, Take 1 capsule (100 mg total) by mouth every 12 (twelve) hours., Disp: 14 capsule, Rfl: 0 .  nystatin cream (MYCOSTATIN), Apply 1 application topically 2 (two) times daily. Mouth prn for 7 days as needed (Patient taking differently: Apply 1 application topically 2 (two) times daily as needed for dry skin. Mouth prn for 7 days as needed), Disp: 30 g, Rfl: 11 .  ondansetron (ZOFRAN) 4 MG tablet, Take 1 tablet (4 mg total) by mouth every 8 (eight) hours as needed for nausea or vomiting., Disp: 30 tablet, Rfl: 0 .  oxybutynin (DITROPAN XL) 15 MG 24 hr tablet,  TAKE ONE TABLET BY MOUTH AT BEDTIME, Disp: 90 tablet, Rfl: 3 .  pregabalin (LYRICA) 25 MG capsule, Take 1 capsule (25 mg total) by mouth 2 (two) times daily., Disp: 60 capsule, Rfl: 2 .  zinc oxide 20 % ointment, Apply 1 application topically as needed for irritation., Disp: 56.7 g, Rfl: 0  Current Facility-Administered Medications:  .  ipratropium-albuterol (DUONEB) 0.5-2.5 (3) MG/3ML nebulizer solution 3 mL, 3 mL, Nebulization, Q6H, [name], [name], MD  EXAM:  VITALS per patient if applicable:  GENERAL: alert, oriented, appears well and in no acute distress  PSYCH/NEURO: pleasant and cooperative, no obvious depression or anxiety, speech and thought processing grossly intact  ASSESSMENT AND PLAN:  Discussed the following assessment and plan:  Exposure to Covid-19 Virus - Plan: COVID 19 testing ordered  Rec warm salt water gargles and below   Moderate persistent asthma without complication - Plan:  pfts pending 09/22/18  rec robitussin dm/mucinex dm  Tessalon perles she declines narcotics for now  On advair, prn duoneb and albuterol and off spiriva  [facility] is rec GI evaluation for chronic cough as well  rec sugar free cough drops  Skin ulcer of left foot with necrosis of muscle ([place]) - Plan: appt plastics in [place] 10/13/18      I discussed the assessment and treatment plan with the patient. The patient was provided an opportunity to ask questions and all were answered. The patient agreed with the plan and demonstrated an understanding of the instructions.   The patient was advised to call back or seek an in-person evaluation if the symptoms worsen or if the condition fails to improve as anticipated.  Time spent 15 minutes  Delorise Jackson, MD

## 2018-09-14 NOTE — Telephone Encounter (Signed)
Pt scheduled for covid testing 09/15/18 @ [facility] @ 11:30. Instructions given and orders placed

## 2018-09-14 NOTE — Telephone Encounter (Signed)
-----   Message from Delorise Jackson, MD sent at 09/14/2018  3:22 PM EDT ----- Primary Visit Coverage  Payer Plan Sponsor Code Group Number Group Name [place] Primary Visit Coverage Subscriber  ID Name [facility] Address [id number] [place] [id number]    [place], [place] [address]  DOB 09/07/2019   COVID testing

## 2018-09-14 NOTE — Patient Outreach (Signed)
[facility]) Care Management  09/14/2018  [name] 01/02/1951 [id number]  CSW was able to make contact with patient today to follow-up regarding need for continued social work involvement, as patient is now engaged with [employer]  [employer] ([employer]) has partnered with [facility] and it's affiliated medical group to bring physician-led in-home medical care to [facility] Advant[employer] members living with multiple chronic conditions.  Through the collaboration with [facility] can now offer high-need members in-home medical services and care coordination, at no additional cost. [facility] services include physicians, nurse practitioners, nurses, social workers and pharmacists.    Patient is definitely receptive to [facility] and reported that she has already been engaged into the program.  CSW was able to email [facility] ([email or web address]) patient's basic demographic information, along with barriers to care and care plan goals.  CSW will perform a case closure on patient, due to patient now being enrolled into an external program.  CSW will fax an update to patient's Primary Care Physician, Dr. Olivia Mackie [name] to ensure that they are aware of CSW's involvement with patient's plan of care.    [name], BSW, MSW, LCSW  Licensed Education officer, [facility]  Mailing [place] [address] [place], [place] [address] Physical Address-[address], [place], [place] [address] Toll Free Main # [phone] Fax # [phone] Cell # [phone]  Office # [phone] [email or web address]

## 2018-09-15 ENCOUNTER — Other Ambulatory Visit: Payer: Self-pay

## 2018-09-15 DIAGNOSIS — R6889 Other general symptoms and signs: Secondary | ICD-10-CM | POA: Diagnosis not present

## 2018-09-15 DIAGNOSIS — Z20822 Contact with and (suspected) exposure to covid-19: Secondary | ICD-10-CM

## 2018-09-19 ENCOUNTER — Other Ambulatory Visit: Admission: RE | Admit: 2018-09-19 | Payer: PPO | Source: Ambulatory Visit

## 2018-09-19 ENCOUNTER — Telehealth: Payer: Self-pay | Admitting: Internal Medicine

## 2018-09-19 ENCOUNTER — Telehealth: Payer: Self-pay

## 2018-09-19 DIAGNOSIS — Z87440 Personal history of urinary (tract) infections: Secondary | ICD-10-CM | POA: Diagnosis not present

## 2018-09-19 DIAGNOSIS — Z7951 Long term (current) use of inhaled steroids: Secondary | ICD-10-CM | POA: Diagnosis not present

## 2018-09-19 DIAGNOSIS — I872 Venous insufficiency (chronic) (peripheral): Secondary | ICD-10-CM | POA: Diagnosis not present

## 2018-09-19 DIAGNOSIS — M6281 Muscle weakness (generalized): Secondary | ICD-10-CM | POA: Diagnosis not present

## 2018-09-19 DIAGNOSIS — J449 Chronic obstructive pulmonary disease, unspecified: Secondary | ICD-10-CM | POA: Diagnosis not present

## 2018-09-19 DIAGNOSIS — E785 Hyperlipidemia, unspecified: Secondary | ICD-10-CM | POA: Diagnosis not present

## 2018-09-19 DIAGNOSIS — E039 Hypothyroidism, unspecified: Secondary | ICD-10-CM | POA: Diagnosis not present

## 2018-09-19 DIAGNOSIS — E1151 Type 2 diabetes mellitus with diabetic peripheral angiopathy without gangrene: Secondary | ICD-10-CM | POA: Diagnosis not present

## 2018-09-19 DIAGNOSIS — Z96659 Presence of unspecified artificial knee joint: Secondary | ICD-10-CM | POA: Diagnosis not present

## 2018-09-19 DIAGNOSIS — Z7901 Long term (current) use of anticoagulants: Secondary | ICD-10-CM | POA: Diagnosis not present

## 2018-09-19 DIAGNOSIS — K219 Gastro-esophageal reflux disease without esophagitis: Secondary | ICD-10-CM | POA: Diagnosis not present

## 2018-09-19 DIAGNOSIS — L97322 Non-pressure chronic ulcer of left ankle with fat layer exposed: Secondary | ICD-10-CM | POA: Diagnosis not present

## 2018-09-19 DIAGNOSIS — E1143 Type 2 diabetes mellitus with diabetic autonomic (poly)neuropathy: Secondary | ICD-10-CM | POA: Diagnosis not present

## 2018-09-19 DIAGNOSIS — I4891 Unspecified atrial fibrillation: Secondary | ICD-10-CM | POA: Diagnosis not present

## 2018-09-19 DIAGNOSIS — E11621 Type 2 diabetes mellitus with foot ulcer: Secondary | ICD-10-CM | POA: Diagnosis not present

## 2018-09-19 DIAGNOSIS — F329 Major depressive disorder, single episode, unspecified: Secondary | ICD-10-CM | POA: Diagnosis not present

## 2018-09-19 DIAGNOSIS — Z86718 Personal history of other venous thrombosis and embolism: Secondary | ICD-10-CM | POA: Diagnosis not present

## 2018-09-19 DIAGNOSIS — Z96611 Presence of right artificial shoulder joint: Secondary | ICD-10-CM | POA: Diagnosis not present

## 2018-09-19 DIAGNOSIS — I10 Essential (primary) hypertension: Secondary | ICD-10-CM | POA: Diagnosis not present

## 2018-09-19 DIAGNOSIS — D649 Anemia, unspecified: Secondary | ICD-10-CM | POA: Diagnosis not present

## 2018-09-19 DIAGNOSIS — L97411 Non-pressure chronic ulcer of right heel and midfoot limited to breakdown of skin: Secondary | ICD-10-CM | POA: Diagnosis not present

## 2018-09-19 DIAGNOSIS — Z7984 Long term (current) use of oral hypoglycemic drugs: Secondary | ICD-10-CM | POA: Diagnosis not present

## 2018-09-19 DIAGNOSIS — L97421 Non-pressure chronic ulcer of left heel and midfoot limited to breakdown of skin: Secondary | ICD-10-CM | POA: Diagnosis not present

## 2018-09-19 DIAGNOSIS — R32 Unspecified urinary incontinence: Secondary | ICD-10-CM | POA: Diagnosis not present

## 2018-09-19 LAB — NOVEL CORONAVIRUS, NAA: SARS-CoV-2, NAA: NOT DETECTED

## 2018-09-19 NOTE — Telephone Encounter (Signed)
Patient called in for the COVID-19 testing results.  She was told that it was NOT Detected.

## 2018-09-19 NOTE — Telephone Encounter (Signed)
Spoke to Tammy B in Cardiopulmonary dept, patient was covid tested 09/14/18 due to exposure, and has come back "not detected." She is still having a cough, but states she hasn't had her xyzal in the last two weeks. She has started it back and is feeling better. Okay per Tammy B to proceed with PFT on 09/22/18 even though covid will be 7 days out. Spoke with patient, she is aware to bring albuterol inhaler with her to PFT apt.

## 2018-09-21 ENCOUNTER — Telehealth: Payer: Self-pay | Admitting: Pulmonary Disease

## 2018-09-21 DIAGNOSIS — Z96659 Presence of unspecified artificial knee joint: Secondary | ICD-10-CM | POA: Diagnosis not present

## 2018-09-21 DIAGNOSIS — Z7951 Long term (current) use of inhaled steroids: Secondary | ICD-10-CM | POA: Diagnosis not present

## 2018-09-21 DIAGNOSIS — E785 Hyperlipidemia, unspecified: Secondary | ICD-10-CM | POA: Diagnosis not present

## 2018-09-21 DIAGNOSIS — R32 Unspecified urinary incontinence: Secondary | ICD-10-CM | POA: Diagnosis not present

## 2018-09-21 DIAGNOSIS — E1143 Type 2 diabetes mellitus with diabetic autonomic (poly)neuropathy: Secondary | ICD-10-CM | POA: Diagnosis not present

## 2018-09-21 DIAGNOSIS — L97411 Non-pressure chronic ulcer of right heel and midfoot limited to breakdown of skin: Secondary | ICD-10-CM | POA: Diagnosis not present

## 2018-09-21 DIAGNOSIS — F329 Major depressive disorder, single episode, unspecified: Secondary | ICD-10-CM | POA: Diagnosis not present

## 2018-09-21 DIAGNOSIS — I4891 Unspecified atrial fibrillation: Secondary | ICD-10-CM | POA: Diagnosis not present

## 2018-09-21 DIAGNOSIS — K219 Gastro-esophageal reflux disease without esophagitis: Secondary | ICD-10-CM | POA: Diagnosis not present

## 2018-09-21 DIAGNOSIS — J449 Chronic obstructive pulmonary disease, unspecified: Secondary | ICD-10-CM | POA: Diagnosis not present

## 2018-09-21 DIAGNOSIS — L97421 Non-pressure chronic ulcer of left heel and midfoot limited to breakdown of skin: Secondary | ICD-10-CM | POA: Diagnosis not present

## 2018-09-21 DIAGNOSIS — Z96611 Presence of right artificial shoulder joint: Secondary | ICD-10-CM | POA: Diagnosis not present

## 2018-09-21 DIAGNOSIS — Z86718 Personal history of other venous thrombosis and embolism: Secondary | ICD-10-CM | POA: Diagnosis not present

## 2018-09-21 DIAGNOSIS — Z87440 Personal history of urinary (tract) infections: Secondary | ICD-10-CM | POA: Diagnosis not present

## 2018-09-21 DIAGNOSIS — E11621 Type 2 diabetes mellitus with foot ulcer: Secondary | ICD-10-CM | POA: Diagnosis not present

## 2018-09-21 DIAGNOSIS — E039 Hypothyroidism, unspecified: Secondary | ICD-10-CM | POA: Diagnosis not present

## 2018-09-21 DIAGNOSIS — M6281 Muscle weakness (generalized): Secondary | ICD-10-CM | POA: Diagnosis not present

## 2018-09-21 DIAGNOSIS — E1151 Type 2 diabetes mellitus with diabetic peripheral angiopathy without gangrene: Secondary | ICD-10-CM | POA: Diagnosis not present

## 2018-09-21 DIAGNOSIS — I10 Essential (primary) hypertension: Secondary | ICD-10-CM | POA: Diagnosis not present

## 2018-09-21 DIAGNOSIS — Z7901 Long term (current) use of anticoagulants: Secondary | ICD-10-CM | POA: Diagnosis not present

## 2018-09-21 DIAGNOSIS — D649 Anemia, unspecified: Secondary | ICD-10-CM | POA: Diagnosis not present

## 2018-09-21 DIAGNOSIS — Z7984 Long term (current) use of oral hypoglycemic drugs: Secondary | ICD-10-CM | POA: Diagnosis not present

## 2018-09-21 DIAGNOSIS — L97322 Non-pressure chronic ulcer of left ankle with fat layer exposed: Secondary | ICD-10-CM | POA: Diagnosis not present

## 2018-09-21 DIAGNOSIS — I872 Venous insufficiency (chronic) (peripheral): Secondary | ICD-10-CM | POA: Diagnosis not present

## 2018-09-22 ENCOUNTER — Ambulatory Visit: Payer: PPO | Attending: Pulmonary Disease

## 2018-09-22 ENCOUNTER — Other Ambulatory Visit: Payer: Self-pay

## 2018-09-22 DIAGNOSIS — R05 Cough: Secondary | ICD-10-CM | POA: Insufficient documentation

## 2018-09-22 DIAGNOSIS — R0600 Dyspnea, unspecified: Secondary | ICD-10-CM

## 2018-09-22 DIAGNOSIS — R0609 Other forms of dyspnea: Secondary | ICD-10-CM | POA: Insufficient documentation

## 2018-09-22 DIAGNOSIS — J988 Other specified respiratory disorders: Secondary | ICD-10-CM | POA: Diagnosis not present

## 2018-09-22 DIAGNOSIS — R053 Chronic cough: Secondary | ICD-10-CM

## 2018-09-22 NOTE — Telephone Encounter (Signed)
Called pt 3 times to reschedule.  No success.  Appt. Cancelled

## 2018-09-23 IMAGING — MG MM DIGITAL SCREENING BILAT W/ CAD
7 series · 7 of 7 positions shown · non-contrast
Comparison: Previous exam(s).

CLINICAL DATA: Screening.

EXAM:
DIGITAL SCREENING BILATERAL MAMMOGRAM WITH CAD

[R CC]
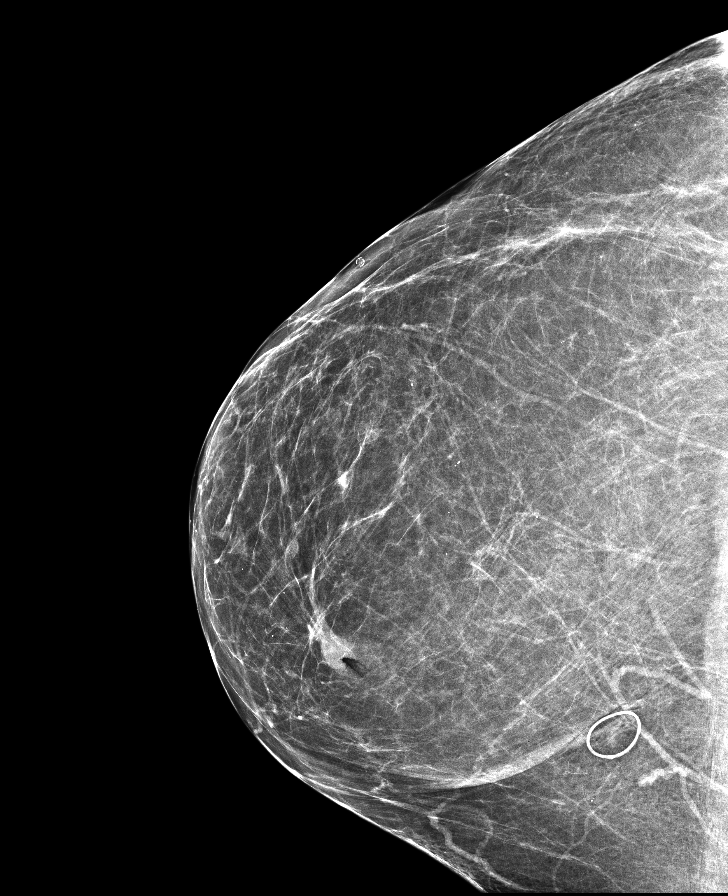

[R MLO (1 of 2)]
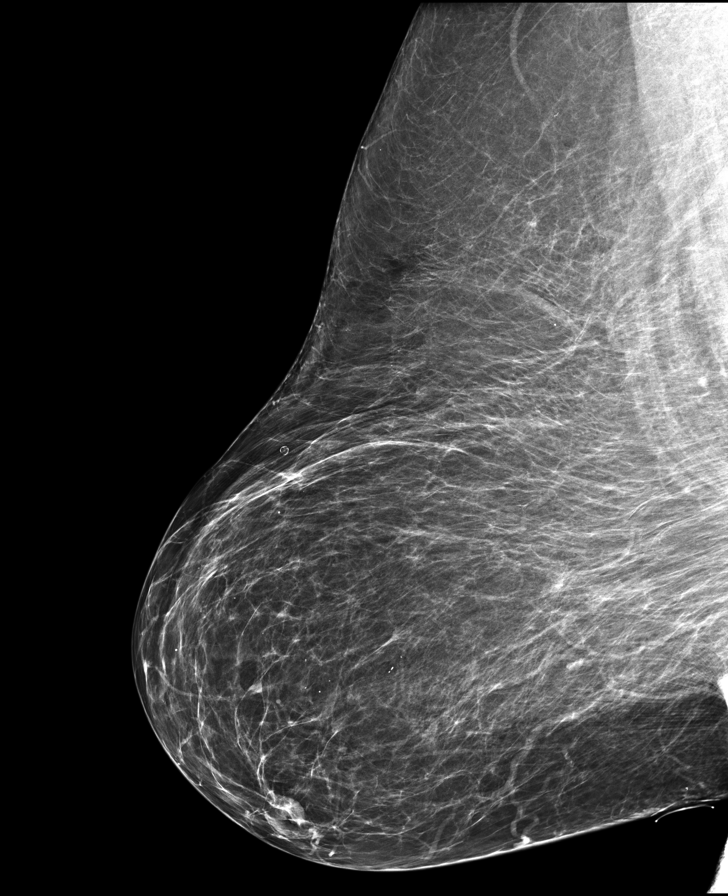

[L MLO (1 of 2)]
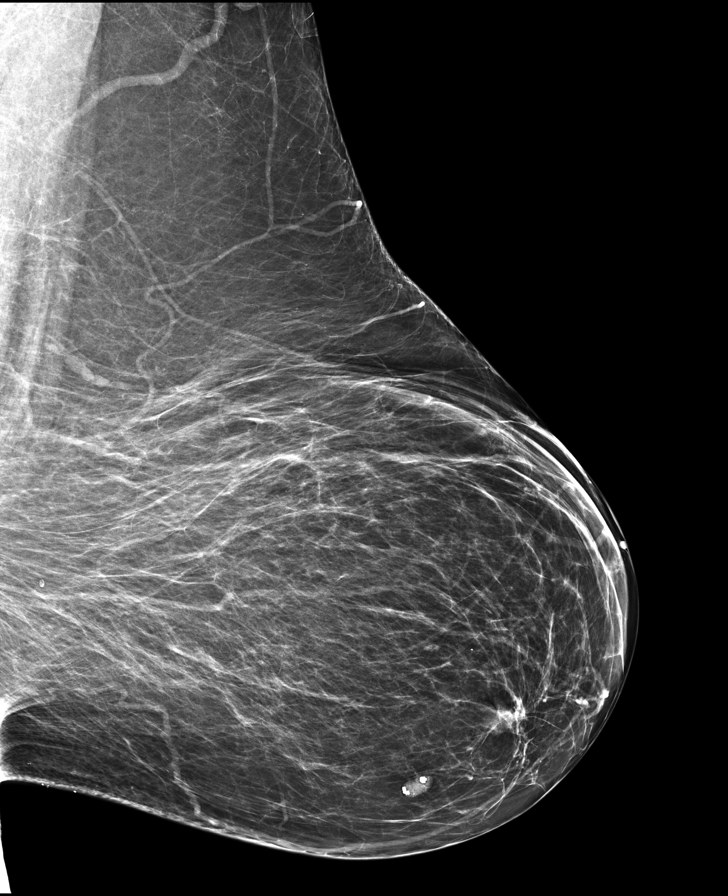

[R MLO (2 of 2)]
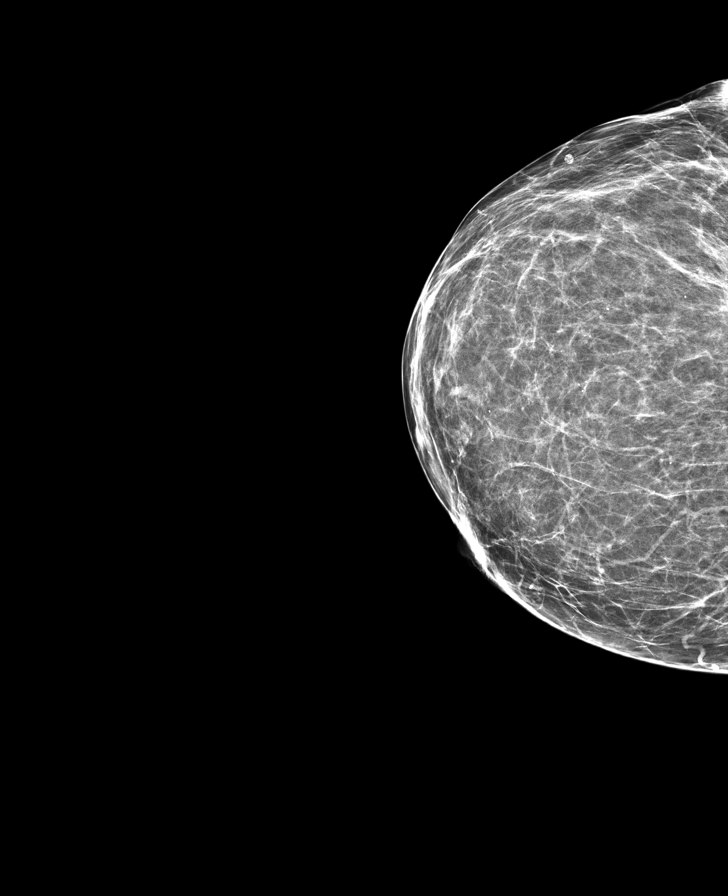

[L MLO (2 of 2)]
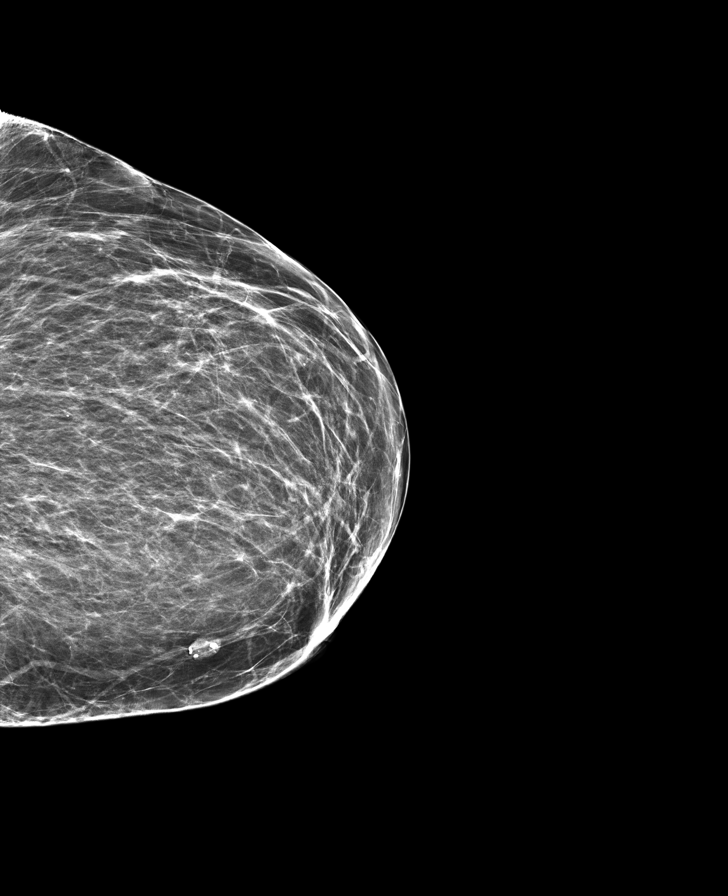

[L CC]
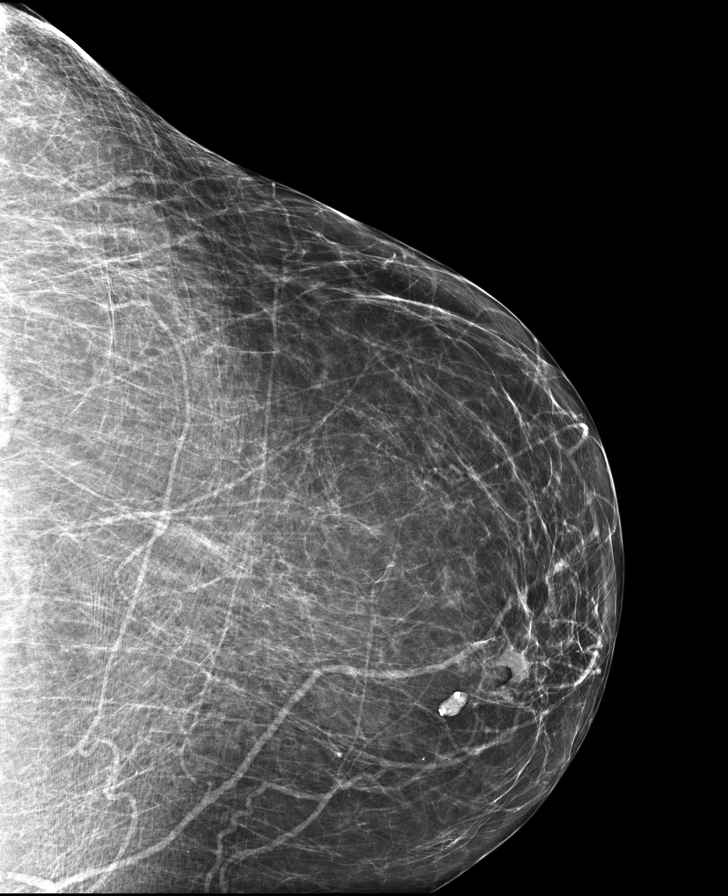

[R CV]
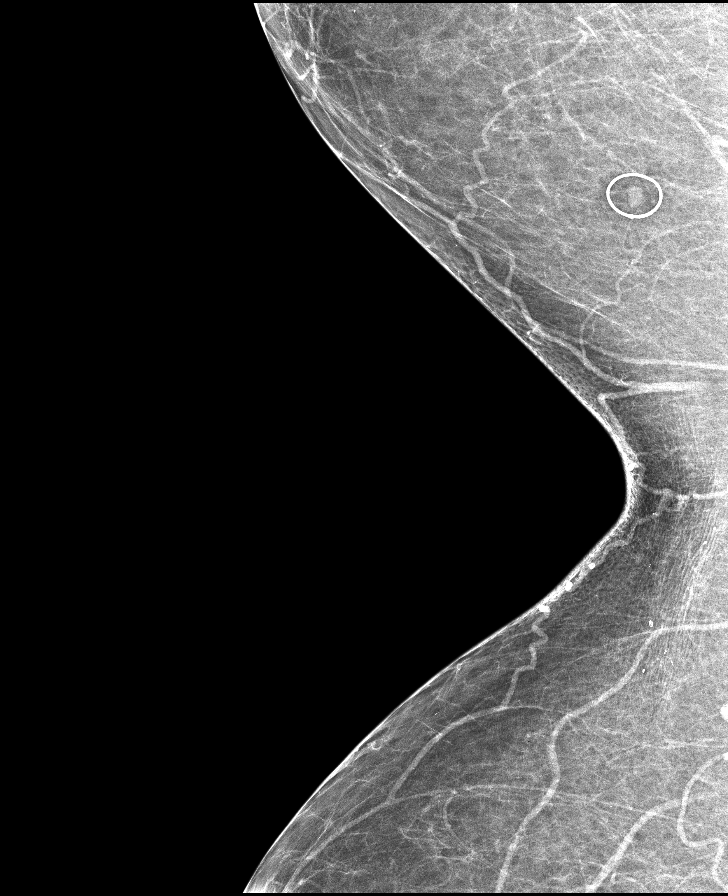

[7 of 7 positions shown; findings below may reference images not displayed]

ACR Breast Density Category b: There are scattered areas of
fibroglandular density.
FINDINGS: There are no findings suspicious for malignancy. Images were
processed with CAD.
IMPRESSION: No mammographic evidence of malignancy. A result letter of this
screening mammogram will be mailed directly to the patient.

RECOMMENDATION:
Screening mammogram in one year. (Code:AS-G-LCT)

BI-RADS CATEGORY  1: Negative.

## 2018-09-26 ENCOUNTER — Other Ambulatory Visit
Admission: RE | Admit: 2018-09-26 | Discharge: 2018-09-26 | Disposition: A | Discharge status: Home / Self Care | Payer: PPO | Source: Ambulatory Visit | Attending: Gastroenterology | Admitting: Gastroenterology

## 2018-09-28 ENCOUNTER — Emergency Department
Admission: EM | Admit: 2018-09-28 | Discharge: 2018-09-28 | Disposition: A | Discharge status: Home / Self Care | Payer: PPO | Attending: Emergency Medicine | Admitting: Emergency Medicine

## 2018-09-28 ENCOUNTER — Other Ambulatory Visit: Payer: Self-pay

## 2018-09-28 ENCOUNTER — Encounter: Payer: Self-pay | Admitting: Emergency Medicine

## 2018-09-28 ENCOUNTER — Ambulatory Visit: Payer: PPO

## 2018-09-28 DIAGNOSIS — E119 Type 2 diabetes mellitus without complications: Secondary | ICD-10-CM | POA: Diagnosis not present

## 2018-09-28 DIAGNOSIS — E039 Hypothyroidism, unspecified: Secondary | ICD-10-CM | POA: Diagnosis not present

## 2018-09-28 DIAGNOSIS — Z7984 Long term (current) use of oral hypoglycemic drugs: Secondary | ICD-10-CM | POA: Diagnosis not present

## 2018-09-28 DIAGNOSIS — I10 Essential (primary) hypertension: Secondary | ICD-10-CM | POA: Diagnosis not present

## 2018-09-28 DIAGNOSIS — Z96652 Presence of left artificial knee joint: Secondary | ICD-10-CM | POA: Diagnosis not present

## 2018-09-28 DIAGNOSIS — Z96611 Presence of right artificial shoulder joint: Secondary | ICD-10-CM | POA: Insufficient documentation

## 2018-09-28 DIAGNOSIS — R300 Dysuria: Secondary | ICD-10-CM | POA: Diagnosis present

## 2018-09-28 DIAGNOSIS — N3090 Cystitis, unspecified without hematuria: Secondary | ICD-10-CM | POA: Diagnosis not present

## 2018-09-28 DIAGNOSIS — Z9049 Acquired absence of other specified parts of digestive tract: Secondary | ICD-10-CM | POA: Insufficient documentation

## 2018-09-28 DIAGNOSIS — Z79899 Other long term (current) drug therapy: Secondary | ICD-10-CM | POA: Diagnosis not present

## 2018-09-28 DIAGNOSIS — Z03818 Encounter for observation for suspected exposure to other biological agents ruled out: Secondary | ICD-10-CM | POA: Insufficient documentation

## 2018-09-28 DIAGNOSIS — J45909 Unspecified asthma, uncomplicated: Secondary | ICD-10-CM | POA: Diagnosis not present

## 2018-09-28 DIAGNOSIS — R319 Hematuria, unspecified: Secondary | ICD-10-CM

## 2018-09-28 LAB — COMPREHENSIVE METABOLIC PANEL
ALT: 13 U/L (ref 0–44)
AST: 18 U/L (ref 15–41)
Albumin: 3.7 g/dL (ref 3.5–5.0)
Alkaline Phosphatase: 84 U/L (ref 38–126)
Anion gap: 8 (ref 5–15)
BUN: 20 mg/dL (ref 8–23)
CO2: 26 mmol/L (ref 22–32)
Calcium: 8.9 mg/dL (ref 8.9–10.3)
Chloride: 106 mmol/L (ref 98–111)
Creatinine, Ser: 1.15 mg/dL — ABNORMAL HIGH (ref 0.44–1.00)
GFR calc Af Amer: 56 mL/min — ABNORMAL LOW (ref >60)
GFR calc non Af Amer: 49 mL/min — ABNORMAL LOW (ref >60)
Glucose, Bld: 127 mg/dL — ABNORMAL HIGH (ref 70–99)
Potassium: 5 mmol/L (ref 3.5–5.1)
Sodium: 140 mmol/L (ref 135–145)
Total Bilirubin: 0.3 mg/dL (ref 0.3–1.2)
Total Protein: 6.6 g/dL (ref 6.5–8.1)

## 2018-09-28 LAB — CBC WITH DIFFERENTIAL/PLATELET
Abs Immature Granulocytes: 0.02 10*3/uL (ref 0.00–0.07)
Basophils Absolute: 0.1 10*3/uL (ref 0.0–0.1)
Basophils Relative: 1 %
Eosinophils Absolute: 0.3 10*3/uL (ref 0.0–0.5)
Eosinophils Relative: 5 %
HCT: 33.4 % — ABNORMAL LOW (ref 36.0–46.0)
Hemoglobin: 10.8 g/dL — ABNORMAL LOW (ref 12.0–15.0)
Immature Granulocytes: 0 %
Lymphocytes Relative: 18 %
Lymphs Abs: 1 10*3/uL (ref 0.7–4.0)
MCH: 28.9 pg (ref 26.0–34.0)
MCHC: 32.3 g/dL (ref 30.0–36.0)
MCV: 89.3 fL (ref 80.0–100.0)
Monocytes Absolute: 0.6 10*3/uL (ref 0.1–1.0)
Monocytes Relative: 11 %
Neutro Abs: 3.8 10*3/uL (ref 1.7–7.7)
Neutrophils Relative %: 65 %
Platelets: 218 10*3/uL (ref 150–400)
RBC: 3.74 MIL/uL — ABNORMAL LOW (ref 3.87–5.11)
RDW: 14.1 % (ref 11.5–15.5)
WBC: 5.8 10*3/uL (ref 4.0–10.5)
nRBC: 0 % (ref 0.0–0.2)

## 2018-09-28 LAB — URINALYSIS, COMPLETE (UACMP) WITH MICROSCOPIC
Bacteria, UA: NONE SEEN
Bilirubin Urine: NEGATIVE
Glucose, UA: NEGATIVE mg/dL
Ketones, ur: NEGATIVE mg/dL
Nitrite: NEGATIVE
Protein, ur: NEGATIVE mg/dL
Specific Gravity, Urine: 1.008 (ref 1.005–1.030)
pH: 5 (ref 5.0–8.0)

## 2018-09-28 LAB — LACTIC ACID, PLASMA: Lactic Acid, Venous: 1.5 mmol/L (ref 0.5–1.9)

## 2018-09-28 MED ORDER — CEPHALEXIN 500 MG PO CAPS: 500.0000 mg | ORAL_CAPSULE | Freq: Two times a day (BID) | ORAL | 0 refills | Status: DC

## 2018-09-28 MED ORDER — PHENAZOPYRIDINE HCL 200 MG PO TABS: 200.0000 mg | ORAL_TABLET | Freq: Three times a day (TID) | ORAL | 0 refills | Status: DC | PRN

## 2018-09-28 MED ORDER — FLUCONAZOLE 150 MG PO TABS: 150.0000 mg | ORAL_TABLET | Freq: Once | ORAL | 0 refills | Status: AC

## 2018-09-28 MED ORDER — FOSFOMYCIN TROMETHAMINE 3 G PO PACK
3.0000 g | PACK | Freq: Once | ORAL | Status: AC
  Administered 2018-09-28: 19:00:00 3 g via ORAL
  Filled 2018-09-28: qty 3

## 2018-09-28 MED ORDER — IPRATROPIUM-ALBUTEROL 0.5-2.5 (3) MG/3ML IN SOLN
RESPIRATORY_TRACT | Status: AC
  Administered 2018-09-28: 3 mL
  Filled 2018-09-28: qty 3

## 2018-09-28 MED ORDER — FOSFOMYCIN TROMETHAMINE 3 G PO PACK: 3.0000 g | PACK | Freq: Once | ORAL | 0 refills | Status: AC

## 2018-09-28 NOTE — ED Notes (Signed)
Report off to kate rn  

## 2018-09-28 NOTE — ED Notes (Signed)
Pt reports bladder spasms and dysuria.  Sx for several weeks.  No nausea/vomiting.  Intermittent fever.  Pt alert   speech clear

## 2018-09-28 NOTE — ED Notes (Signed)
ED Provider at bedside. 

## 2018-09-28 NOTE — ED Provider Notes (Addendum)
[facility] Emergency Department Provider Note   ____________________________________________   First MD Initiated Contact with Patient 09/28/18 1826     (approximate)  I have reviewed the triage vital signs and the nursing notes.   HISTORY  Chief Complaint Dysuria    HPI [name] is a 68 y.o. female patient reports at least monthly UTIs since January.  She gets frequent episodes of dysuria with urgency and frequency and has a UTI gets antibiotics gets better and then gets worse again.  Is having another UTI now.  She has been taking an antibiotic for the last 2 days without any results.  She is also taking Azo without any help.  She is not having anything but a low-grade fever 99 or so at home.  Is not having any nausea vomiting back pain or any other symptoms.  She follows up with [facility].         Past Medical History:  Diagnosis Date  . Abnormal antibody titer   . Anxiety and depression   . Arthritis   . Asthma   . Cystocele   . Depression   . Diabetes ([place])   . DVT (deep venous thrombosis) (HCC)    Righ calf  . Dysrhythmia   . Edema   . GERD (gastroesophageal reflux disease)   . Heart murmur    Echocardiogram June 2015: Mild MR (possible vegetation seen on TTE, not seen on TEE), normal LV size with moderate concentric LVH. Normal function EF 60-65%. Normal diastolic function. Mild LA dilation.  Marland Kitchen History of IBS   . History of methicillin resistant staphylococcus aureus (MRSA) 2008  . Hyperlipidemia   . Hypertension   . Hypothyroidism   . Insomnia   . Kidney stone    kidney stones with lithotripsy  . Neuropathy involving both lower extremities   . Obesity, Class II, BMI 35-39.9, with comorbidity   . Paroxysmal atrial fibrillation ([place]) 2007   In June 2015, Cardiac Event Monitor: Mostly SR/sinus arrhythmia with PVCs that are frequent. Short bursts of A. fib lasting several minutes;; CHA2DS2-VASc Score = 5 (age, Female,  PVD, DM, HTN)  . Peripheral vascular disease ([place])   . Sleep apnea    use C-PAP  . Urinary incontinence   . Venous stasis dermatitis of both lower extremities     Patient Active Problem List   Diagnosis Date Noted  . Fibromyalgia 08/17/2018  . Chronic musculoskeletal pain 08/17/2018  . Tremor of right hand 08/03/2018  . Overactive bladder 08/03/2018  . Physical deconditioning 08/03/2018  . Chronic UTI 07/29/2018  . Foot ulcer, left ([place]) 03/09/2018  . Adrenal mass ([place]) 08/02/2017  . Chronic atrial fibrillation 08/02/2017  . Eczema 06/27/2017  . Diabetic ulcer of left foot ([place]) 06/24/2017  . Mass of both adrenal glands ([place]) 05/16/2017  . Fatty liver 05/13/2017  . Strain of right hip 04/30/2017  . Ischial pain, right 04/30/2017  . Pain of left hip joint 04/22/2017  . Abnormality of gait and mobility 04/22/2017  . Muscle strain of left hip 04/09/2017  . Anemia 11/18/2016  . Asthma 11/18/2016  . Carpal tunnel syndrome 11/18/2016  . Pain in joint involving ankle and foot 11/18/2016  . Obstructive sleep apnea of adult 11/18/2016  . Spinal stenosis of thoracic region 11/18/2016  . Cubital tunnel syndrome on right 08/07/2016  . Chronic pain syndrome 02/25/2016  . Long term current use of opiate analgesic 02/25/2016  . Chronic right shoulder pain 02/25/2016  . Status  post reverse total shoulder replacement 10/08/2015  . 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 Morbid obesity with BMI of 40.0-44.9, adult ([place]) 09/23/2015  . Chronic prescription benzodiazepine use 09/23/2015  . Osteoarthritis involving multiple joints 09/23/2015  . Other shoulder lesions (Right) 08/28/2015  . Cervical radiculitis 08/06/2015  . Fall 05/22/2015  . Opiate use (15 MME/Day) 01/16/2015  . Impingement syndrome of shoulder region 01/07/2015  . Mixed incontinence 11/26/2014  . Atrophic vaginitis 11/26/2014  . Anxiety and depression 09/21/2014  . Bladder cystocele 09/21/2014   . Gastro-esophageal reflux disease without esophagitis 09/21/2014  . DM (diabetes mellitus) type II, controlled, with peripheral vascular disorder ([place]) 08/31/2014  . Diabetic peripheral neuropathy associated with type 2 diabetes mellitus ([place]) 08/31/2014  . Asthma, moderate persistent, well-controlled 08/31/2014  . Hypothyroidism, adult 08/31/2014  . Peripheral vascular disease due to secondary diabetes mellitus ([place]) 12/11/2013  . Apnea, sleep 10/19/2013  . Paroxysmal atrial fibrillation ([place]); CHA2DS2-Vasc Score 5. On Pradaxa 07/27/2013  . Essential hypertension 07/27/2013    Past Surgical History:  Procedure Laterality Date  . ANKLE SURGERY Right   . APPLICATION OF WOUND VAC Left 06/27/2015   Procedure: APPLICATION OF WOUND VAC ( POSSIBLE ) ;  Surgeon: [name], MD;  Location: [facility] ORS;  Service: Vascular;  Laterality: Left;  . APPLICATION OF WOUND VAC Left 09/25/2016   Procedure: APPLICATION OF WOUND VAC;  Surgeon: [name], DPM;  Location: [facility] ORS;  Service: Podiatry;  Laterality: Left;  . arm surgery     right    . CHOLECYSTECTOMY    . COLONOSCOPY    . COLONOSCOPY WITH PROPOFOL N/A 01/11/2017   Procedure: COLONOSCOPY WITH PROPOFOL;  Surgeon: [name], MD;  Location: [facility] ENDOSCOPY;  Service: Endoscopy;  Laterality: N/A;  . COLONOSCOPY WITH PROPOFOL N/A 02/22/2017   Procedure: COLONOSCOPY WITH PROPOFOL;  Surgeon: [name], MD;  Location: [facility] ENDOSCOPY;  Service: Endoscopy;  Laterality: N/A;  . COLONOSCOPY WITH PROPOFOL N/A 05/31/2017   Procedure: COLONOSCOPY WITH PROPOFOL;  Surgeon: [name], MD;  Location: [facility] ENDOSCOPY;  Service: Endoscopy;  Laterality: N/A;  . ESOPHAGOGASTRODUODENOSCOPY (EGD) WITH PROPOFOL N/A 01/11/2017   Procedure: ESOPHAGOGASTRODUODENOSCOPY (EGD) WITH PROPOFOL;  Surgeon: [name], MD;  Location: [facility] ENDOSCOPY;  Service: Endoscopy;  Laterality: N/A;  . HERNIA REPAIR     umbilical  . HIATAL HERNIA REPAIR     . I&D EXTREMITY Left 06/27/2015   Procedure: IRRIGATION AND DEBRIDEMENT EXTREMITY            ( CALF HEMATOMA ) POSSIBLE WOUND VAC;  Surgeon: [name], MD;  Location: [facility] ORS;  Service: Vascular;  Laterality: Left;  . INCISION AND DRAINAGE OF WOUND Left 09/25/2016   Procedure: IRRIGATION AND DEBRIDEMENT - PARTIAL RESECTION OF ACHILLES TENDON WITH WOUND VAC APPLICATION;  Surgeon: [name], DPM;  Location: [facility] ORS;  Service: Podiatry;  Laterality: Left;  . IRRIGATION AND DEBRIDEMENT ABSCESS Left 09/07/2016   Procedure: IRRIGATION AND DEBRIDEMENT ABSCESS LEFT HEEL;  Surgeon: [name], DPM;  Location: [facility] ORS;  Service: Podiatry;  Laterality: Left;  . JOINT REPLACEMENT  2013   left knee replacement  . KIDNEY SURGERY Right    kidney stones  . LITHOTRIPSY    . LOWER EXTREMITY ANGIOGRAPHY Left 04/04/2018   Procedure: LOWER EXTREMITY ANGIOGRAPHY;  Surgeon: [name], MD;  Location: [place] CV LAB;  Service: Cardiovascular;  Laterality: Left;  . NM GATED MYOVIEW [facility] HX)  February 2017  Likely breast attenuation. LOW RISK study. Normal EF 55-60%.  . OTHER SURGICAL HISTORY     08/2016 or 09/2016 surgery on achilles tenso h/o staph infection heal. Dr. [name]  . removal of left hematoma Left    leg  . REVERSE SHOULDER ARTHROPLASTY Right 10/08/2015   Procedure: REVERSE SHOULDER ARTHROPLASTY;  Surgeon: [name], MD;  Location: [facility] ORS;  Service: Orthopedics;  Laterality: Right;  . SLEEVE GASTROPLASTY    . TONSILLECTOMY    . TOTAL KNEE ARTHROPLASTY    . TOTAL SHOULDER REPLACEMENT Right   . TRANSESOPHAGEAL ECHOCARDIOGRAM  08/08/2013   Mild LVH, EF 60-65%. Moderate LA dilation and mild RA dilation. Mild MR with no evidence of stenosis and no evidence of endocarditis. A false chordae is noted.  . TRANSTHORACIC ECHOCARDIOGRAM  08/03/2013   Mild-Moderate concentric LVH, EF 60-65%. Normal diastolic function. Mild LA dilation. Mild MR with possible vegetation  - not confirmed on  TEE   . ULNAR NERVE TRANSPOSITION Right 08/06/2016   Procedure: ULNAR NERVE DECOMPRESSION/TRANSPOSITION;  Surgeon: [name], MD;  Location: [facility] ORS;  Service: Orthopedics;  Laterality: Right;  . UPPER GI ENDOSCOPY    . VAGINAL HYSTERECTOMY      Prior to Admission medications   Medication Sig Start Date End Date Taking? Authorizing Provider  albuterol (PROVENTIL HFA;VENTOLIN HFA) 108 (90 Base) MCG/ACT inhaler Inhale 1-2 puffs into the lungs every 4 (four) hours as needed for wheezing or shortness of breath. Patient taking differently: Inhale 2 puffs into the lungs every 4 (four) hours as needed for wheezing or shortness of breath.  03/29/15   Bobetta Lime, MD  albuterol (PROVENTIL) (2.5 MG/3ML) 0.083% nebulizer solution Take 3 mLs (2.5 mg total) by nebulization every 6 (six) hours as needed for wheezing or shortness of breath. 02/21/15   Ashok Norris, MD  amLODipine (NORVASC) 5 MG tablet Take 1 tablet (5 mg total) by mouth daily. 04/20/18   [name], [name], MD  buPROPion (WELLBUTRIN XL) 150 MG 24 hr tablet Take 1 tablet (150 mg total) by mouth daily. 08/03/18   [name], [name], MD  Calcium Citrate-Vitamin D (CALCIUM + D PO) Take 1 tablet by mouth 2 (two) times daily.    [provider]  cephALEXin (KEFLEX) 500 MG capsule Take 1 capsule (500 mg total) by mouth 2 times daily at 12 noon and 4 pm. 09/28/18   Nena Polio, MD  clobetasol cream (TEMOVATE) 0.93 % Apply 1 application topically 2 (two) times daily. To arms Patient taking differently: Apply 1 application topically 2 (two) times daily as needed (irritation). To arms 02/03/18   [name], [name], MD  dabigatran (PRADAXA) 150 MG CAPS capsule Take 1 capsule (150 mg total) by mouth 2 (two) times daily. 05/24/18   Wellington Hampshire, MD  Dexlansoprazole (DEXILANT) 30 MG capsule Take 1 capsule (30 mg total) by mouth daily. Note lower maintenance dose take 30 min before food lunch or dinner not with thyroid  medication Patient taking differently: Take 60 mg by mouth daily. Note lower maintenance dose take 30 min before food lunch or dinner not with thyroid medication 05/17/18   [name], [name], MD  diclofenac sodium (VOLTAREN) 1 % GEL Apply 1 application topically daily as needed (pain).     [provider]  DULoxetine (CYMBALTA) 60 MG capsule Take 1 capsule (60 mg total) by mouth daily. 09/06/18 12/05/18  Milinda Pointer, MD  estradiol (ESTRACE) 0.1 MG/GM vaginal cream Apply 0.5mg  (pea-sized amount)  just inside  the vaginal introitus with a finger-tip on Monday, Wednesday and Friday nights, 12/15/17   Zara Council A, PA-C  fesoterodine (TOVIAZ) 8 MG TB24 tablet Take 1 tablet (8 mg total) by mouth daily. 07/13/18   Zara Council A, PA-C  fluconazole (DIFLUCAN) 150 MG tablet Take 1 tablet (150 mg total) by mouth once for 1 dose. 09/28/18 09/28/18  Nena Polio, MD  fluticasone-salmeterol (ADVAIR HFA) [phone] MCG/ACT inhaler Inhale 2 puffs into the lungs daily. Rinse mouth thoroughly after use 07/18/18   Wilhelmina Mcardle, MD  fosfomycin (MONUROL) 3 g PACK Take 3 g by mouth once for 1 dose. 09/28/18 09/28/18  Nena Polio, MD  hydrochlorothiazide (HYDRODIURIL) 12.5 MG tablet Take 1 tablet (12.5 mg total) by mouth daily. In am 04/06/18   [name], [name], MD  HYDROcodone-acetaminophen (NORCO/VICODIN) 5-325 MG tablet Take 1 tablet by mouth every 8 (eight) hours as needed for up to 30 days for severe pain. Must last 30 days 09/06/18 10/06/18  Milinda Pointer, MD  HYDROcodone-acetaminophen (NORCO/VICODIN) 5-325 MG tablet Take 1 tablet by mouth every 8 (eight) hours as needed for up to 30 days for severe pain. Must last 30 days 10/06/18 11/05/18  Milinda Pointer, MD  HYDROcodone-acetaminophen (NORCO/VICODIN) 5-325 MG tablet Take 1 tablet by mouth every 8 (eight) hours as needed for up to 30 days for severe pain. Must last 30 days 11/05/18 12/05/18  Milinda Pointer, MD  levocetirizine  (XYZAL) 5 MG tablet Take 1 tablet (5 mg total) by mouth every evening. 08/12/18   [name], [name], MD  levothyroxine (SYNTHROID, LEVOTHROID) 175 MCG tablet Take 1 tablet (175 mcg total) by mouth daily before breakfast. 06/21/18   [name], [name], MD  losartan (COZAAR) 50 MG tablet Take 1 tablet (50 mg total) by mouth daily. Do not take quinapril 40 05/12/18   [name], [name], MD  meloxicam (MOBIC) 15 MG tablet Take 1 tablet (15 mg total) by mouth daily as needed for pain. 09/14/18   [name], [name], MD  metFORMIN (GLUCOPHAGE) 1000 MG tablet Take 1 tablet (1,000 mg total) by mouth 2 (two) times daily. With food 05/26/18   [name], [name], MD  montelukast (SINGULAIR) 10 MG tablet Take 1 tablet (10 mg total) by mouth daily. 02/22/18   [name], [name], MD  mupirocin ointment (BACTROBAN) 2 % Apply 1 application topically 2 (two) times daily. Skin wounds 10/04/17   [name], [name], MD  nitrofurantoin, macrocrystal-monohydrate, (MACROBID) 100 MG capsule Take 1 capsule (100 mg total) by mouth every 12 (twelve) hours. 07/29/18   Zara Council A, PA-C  nystatin cream (MYCOSTATIN) Apply 1 application topically 2 (two) times daily. Mouth prn for 7 days as needed Patient taking differently: Apply 1 application topically 2 (two) times daily as needed for dry skin. Mouth prn for 7 days as needed 11/17/17   [name], [name], MD  ondansetron (ZOFRAN) 4 MG tablet Take 1 tablet (4 mg total) by mouth every 8 (eight) hours as needed for nausea or vomiting. 05/19/18   [name], [name], MD  oxybutynin (DITROPAN XL) 15 MG 24 hr tablet TAKE ONE TABLET BY MOUTH AT BEDTIME 09/13/18   Bjorn Loser, MD  phenazopyridine (PYRIDIUM) 200 MG tablet Take 1 tablet (200 mg total) by mouth 3 (three) times daily as needed for pain. 09/28/18 09/28/19  Nena Polio, MD  pregabalin (LYRICA) 25 MG capsule Take 1 capsule (25 mg total) by mouth 2 (two) times daily. 09/06/18  12/05/18  Milinda Pointer, MD  zinc oxide 20 % ointment Apply 1 application topically as needed for irritation. 09/02/17   Zara Council A, PA-C    Allergies Penicillins, Aspirin, Morphine and related, and Terramycin [oxytetracycline]  Family History  Problem Relation Age of Onset  . Skin cancer Father   . Diabetes Father   . Hypertension Father   . Peripheral vascular disease Father   . Cancer Father   . Cerebral aneurysm Father   . Alcohol abuse Father   . Varicose Veins Mother   . Kidney disease Mother   . Arthritis Mother   . Mental illness Sister   . Cancer Maternal Aunt        breast  . Breast cancer Maternal Aunt   . Arthritis Maternal Grandmother   . Hypertension Maternal Grandmother   . Diabetes Maternal Grandmother   . Arthritis Maternal Grandfather   . Heart disease Maternal Grandfather   . Hypertension Maternal Grandfather   . Arthritis Paternal Grandmother   . Hypertension Paternal Grandmother   . Diabetes Paternal Grandmother   . Arthritis Paternal Grandfather   . Hypertension Paternal Grandfather   . Bladder Cancer Neg Hx   . Kidney cancer Neg Hx     Social History Social History   Tobacco Use  . Smoking status: Never Smoker  . Smokeless tobacco: Never Used  Substance Use Topics  . Alcohol use: No    Alcohol/week: 0.0 standard drinks  . Drug use: No    Review of Systems  Constitutional: No fever/chills Eyes: No visual changes. ENT: No sore throat. Cardiovascular: Denies chest pain. Respiratory: Denies shortness of breath. Gastrointestinal: No abdominal pain.  No nausea, no vomiting.  No diarrhea.  No constipation. Genitourinary: dysuria. Musculoskeletal: Negative for back pain. Skin: Negative for rash. Neurological: Negative for headaches, focal weakness   ____________________________________________   PHYSICAL EXAM:  VITAL SIGNS: ED Triage Vitals  Enc Vitals Group     BP 09/28/18 1553 (!) 166/80     Pulse Rate 09/28/18 1553  65     Resp --      Temp 09/28/18 1553 99 F (37.2 C)     Temp Source 09/28/18 1553 Oral     SpO2 09/28/18 1553 93 %     Weight 09/28/18 1554 252 lb (114.3 kg)     Height 09/28/18 1554 5' 5.5" (1.664 m)     Head Circumference --      Peak Flow --      Pain Score 09/28/18 1556 7     Pain Loc --      Pain Edu? --      Excl. in [place]? --     Constitutional: Alert and oriented. Well appearing and in no acute distress. Eyes: Conjunctivae are normal. Head: Atraumatic. Nose: No congestion/rhinnorhea. Mouth/Throat: Mucous membranes are moist.  Oropharynx non-erythematous. Neck: No stridor. Cardiovascular: Normal rate, regular rhythm. Grossly normal heart sounds.  Good peripheral circulation. Respiratory: Normal respiratory effort.  No retractions. Lungs CTAB. Gastrointestinal: Soft and nontender. No distention. No abdominal bruits. No CVA tenderness. Musculoskeletal: No lower extremity tenderness nor edema.   Neurologic:  Normal speech and language. No gross focal neurologic deficits are appreciated. Psychiatric: Mood and affect are normal. Speech and behavior are normal.  ____________________________________________   LABS (all labs ordered are listed, but only abnormal results are displayed)  Labs Reviewed  CBC WITH DIFFERENTIAL/PLATELET - Abnormal; Notable for the following components:      Result Value   RBC 3.74 (*)    Hemoglobin 10.8 (*)  HCT 33.4 (*)    All other components within normal limits  COMPREHENSIVE METABOLIC PANEL - Abnormal; Notable for the following components:   Glucose, Bld 127 (*)    Creatinine, Ser 1.15 (*)    GFR calc non Af Amer 49 (*)    GFR calc Af Amer 56 (*)    All other components within normal limits  URINALYSIS, COMPLETE (UACMP) WITH MICROSCOPIC - Abnormal; Notable for the following components:   Color, Urine YELLOW (*)    APPearance HAZY (*)    Hgb urine dipstick MODERATE (*)    Leukocytes,Ua SMALL (*)    All other components within normal  limits  LACTIC ACID, PLASMA  LACTIC ACID, PLASMA   ____________________________________________  EKG   ____________________________________________  RADIOLOGY  ED MD interpretation:   Official radiology report(s): No results found.  ____________________________________________   PROCEDURES  Procedure(s) performed (including Critical Care):  Procedures   ____________________________________________   INITIAL IMPRESSION / ASSESSMENT AND PLAN / ED COURSE  Patient does have a UTI.  I will give her fosfomycin as this worked last time.  She got 2 doses of it.  I will also give her prophylactic Keflex low-dose for just over a month.  I will have her follow-up with her regular urology doctor.  We will also give her some Pyridium and she is asking for a COVID-19 test as her urologist will see her with a low-grade fever unless she has a negative one recently.  Her last 1 was over a week ago.      Latrisa Hellums was evaluated in Emergency Department on 09/28/2018 for the symptoms described in the history of present illness. She was evaluated in the context of the global COVID-19 pandemic, which necessitated consideration that the patient might be at risk for infection with the SARS-CoV-2 virus that causes COVID-19. Institutional protocols and algorithms that pertain to the evaluation of patients at risk for COVID-19 are in a state of rapid change based on information released by regulatory bodies including the CDC and federal and state organizations. These policies and algorithms were followed during the patient's care in the ED.       ____________________________________________   FINAL CLINICAL IMPRESSION(S) / ED DIAGNOSES  Final diagnoses:  Cystitis     ED Discharge Orders         Ordered    phenazopyridine (PYRIDIUM) 200 MG tablet  3 times daily PRN     09/28/18 1904    fosfomycin (MONUROL) 3 g PACK   Once     09/28/18 1904    fluconazole (DIFLUCAN) 150 MG tablet    Once     09/28/18 1904    cephALEXin (KEFLEX) 500 MG capsule  2 times daily     09/28/18 1904           Note:  This document was prepared using Dragon voice recognition software and may include unintentional dictation errors.    Nena Polio, MD 09/28/18 Einar Crow    Nena Polio, MD 09/28/18 Einar Crow

## 2018-09-28 NOTE — Discharge Instructions (Addendum)
You do have a UTI.  I will give you the fosfomycin as we discussed.  I will give you the Keflex 1 twice a day to use as a prophylactic antibiotic.  I will also give you the Diflucan 1 pill to use as needed.  If you get a yeast infection from the low-dose Keflex please let your regular doctor or your urologist know or come back here and we will give you more Diflucan if you needed.  Please return for any fever vomiting or feeling sicker.  Please follow-up with your urologist in the next several days as we discussed.

## 2018-09-28 NOTE — Progress Notes (Signed)
Patient not seen in office today. Patient presented in the office today complaining of burning urination and frequency. Patient admitted to having a fever. While in the bathroom, patient began coughing a lot. Patients husband called during this time and spoke to [place] stating that she has been sick with fever, cough, SOB, and their granddaughter has been in [facility] and he's worried about their exposure to [place] but asked her not to tell anyone but [place]. With patient's symptoms and possible exposure, [name] directed patient to ED.

## 2018-09-28 NOTE — ED Triage Notes (Signed)
C/O burning with urination, hematuria, bladder spasms.  Patient states she continues to have recurrent UTI's since January.

## 2018-09-28 NOTE — Addendum Note (Signed)
Addended by: Feliberto Gottron on: 09/28/2018 03:38 PM   Modules accepted: Orders

## 2018-09-29 ENCOUNTER — Encounter: Admission: RE | Payer: Self-pay | Source: Home / Self Care

## 2018-09-29 ENCOUNTER — Ambulatory Visit: Admission: RE | Admit: 2018-09-29 | Payer: PPO | Source: Home / Self Care | Admitting: Gastroenterology

## 2018-09-29 SURGERY — ESOPHAGOGASTRODUODENOSCOPY (EGD) WITH PROPOFOL
Anesthesia: General

## 2018-09-30 ENCOUNTER — Ambulatory Visit
Admission: RE | Admit: 2018-09-30 | Discharge: 2018-09-30 | Disposition: A | Discharge status: Home / Self Care | Payer: PPO | Source: Ambulatory Visit | Attending: Internal Medicine | Admitting: Internal Medicine

## 2018-09-30 ENCOUNTER — Ambulatory Visit (INDEPENDENT_AMBULATORY_CARE_PROVIDER_SITE_OTHER): Payer: PPO | Admitting: Internal Medicine

## 2018-09-30 ENCOUNTER — Ambulatory Visit: Payer: Self-pay | Admitting: Internal Medicine

## 2018-09-30 ENCOUNTER — Other Ambulatory Visit: Payer: Self-pay

## 2018-09-30 DIAGNOSIS — M7989 Other specified soft tissue disorders: Secondary | ICD-10-CM | POA: Diagnosis not present

## 2018-09-30 DIAGNOSIS — M79605 Pain in left leg: Secondary | ICD-10-CM

## 2018-09-30 DIAGNOSIS — N3000 Acute cystitis without hematuria: Secondary | ICD-10-CM | POA: Diagnosis not present

## 2018-09-30 LAB — NOVEL CORONAVIRUS, NAA (HOSP ORDER, SEND-OUT TO REF LAB; TAT 18-24 HRS): SARS-CoV-2, NAA: NOT DETECTED

## 2018-09-30 NOTE — Telephone Encounter (Signed)
Pt. Reports her left leg started swelling Wed. Night. Swollen from her ankle to her knee, skin is red and looks like she has a rash on the skin as well. States she has had cellulitis in the past and this is what it looked like. Had a fever Wed. None today.Hurts when she walks on it. Currently on Keflex for a UTI. Warm transfer to [place] in the practice.  Answer Assessment - Initial Assessment Questions 1. ONSET: "When did the swelling start?" (e.g., minutes, hours, days)     Started Wed. night 2. LOCATION: "What part of the leg is swollen?"  "Are both legs swollen or just one leg?"      From ankle to knee 3. SEVERITY: "How bad is the swelling?" (e.g., localized; mild, moderate, severe)  - Localized - small area of swelling localized to one leg  - MILD pedal edema - swelling limited to foot and ankle, pitting edema < 1/4 inch (6 mm) deep, rest and elevation eliminate most or all swelling  - MODERATE edema - swelling of lower leg to knee, pitting edema > 1/4 inch (6 mm) deep, rest and elevation only partially reduce swelling  - SEVERE edema - swelling extends above knee, facial or hand swelling present      Moderate 4. REDNESS: "Does the swelling look red or infected?"     Yes 5. PAIN: "Is the swelling painful to touch?" If so, ask: "How painful is it?"   (Scale 1-10; mild, moderate or severe)     3-4 with walking  It is worse 6. FEVER: "Do you have a fever?" If so, ask: "What is it, how was it measured, and when did it start?"      Had one Wed. 7. CAUSE: "What do you think is causing the leg swelling?"     Cellulitis 8. MEDICAL HISTORY: "Do you have a history of heart failure, kidney disease, liver failure, or cancer?"     A fib  9. RECURRENT SYMPTOM: "Have you had leg swelling before?" If so, ask: "When was the last time?" "What happened that time?"     Yes - cellulitis 10. OTHER SYMPTOMS: "Do you have any other symptoms?" (e.g., chest pain, difficulty breathing)       Ulcer to bottom of  foot 11. PREGNANCY: "Is there any chance you are pregnant?" "When was your last menstrual period?"       No  Protocols used: LEG SWELLING AND EDEMA-A-AH

## 2018-09-30 NOTE — Telephone Encounter (Signed)
Patient was scheduled an appt with Dr. Aundra Dubin today @ 1:00 pm.

## 2018-09-30 NOTE — Telephone Encounter (Signed)
appt today at 1:00

## 2018-09-30 NOTE — Progress Notes (Signed)
Virtual Visit via Video Note  I connected with [name]   on 09/30/18 at  1:20 PM EDT by a video enabled telemedicine application and verified that I am speaking with the correct person using two identifiers.  Location patient: home Location provider:work  Persons participating in the virtual visit: patient, provider, [place] NP [place] [phone]  I discussed the limitations of evaluation and management by telemedicine and the availability of in person appointments. The patient expressed understanding and agreed to proceed.   HPI: 1. Left leg pain and swelling x 1 week h/o DVT off pradaxa since 09/25/2018 due to GI procedure pending. She also has had fever. She is worried about cellulitis  It hurts to touch or move her leg/calf 2. UTI sxs on fosfomycin and also given Keflex 500 mg bid x 1 month in the ED recently UA done but not culture she no longer has fever    ROS: See pertinent positives and negatives per HPI.  Past Medical History:  Diagnosis Date  . Abnormal antibody titer   . Anxiety and depression   . Arthritis   . Asthma   . Cystocele   . Depression   . Diabetes ([place])   . DVT (deep venous thrombosis) (HCC)    Righ calf  . Dysrhythmia   . Edema   . GERD (gastroesophageal reflux disease)   . Heart murmur    Echocardiogram June 2015: Mild MR (possible vegetation seen on TTE, not seen on TEE), normal LV size with moderate concentric LVH. Normal function EF 60-65%. Normal diastolic function. Mild LA dilation.  Marland Kitchen History of IBS   . History of methicillin resistant staphylococcus aureus (MRSA) 2008  . Hyperlipidemia   . Hypertension   . Hypothyroidism   . Insomnia   . Kidney stone    kidney stones with lithotripsy  . Neuropathy involving both lower extremities   . Obesity, Class II, BMI 35-39.9, with comorbidity   . Paroxysmal atrial fibrillation ([place]) 2007   In June 2015, Cardiac Event Monitor: Mostly SR/sinus arrhythmia with PVCs that are frequent. Short  bursts of A. fib lasting several minutes;; CHA2DS2-VASc Score = 5 (age, Female, PVD, DM, HTN)  . Peripheral vascular disease ([place])   . Sleep apnea    use C-PAP  . Urinary incontinence   . Venous stasis dermatitis of both lower extremities     Past Surgical History:  Procedure Laterality Date  . ANKLE SURGERY Right   . APPLICATION OF WOUND VAC Left 06/27/2015   Procedure: APPLICATION OF WOUND VAC ( POSSIBLE ) ;  Surgeon: [name], MD;  Location: [facility] ORS;  Service: Vascular;  Laterality: Left;  . APPLICATION OF WOUND VAC Left 09/25/2016   Procedure: APPLICATION OF WOUND VAC;  Surgeon: [name], DPM;  Location: [facility] ORS;  Service: Podiatry;  Laterality: Left;  . arm surgery     right    . CHOLECYSTECTOMY    . COLONOSCOPY    . COLONOSCOPY WITH PROPOFOL N/A 01/11/2017   Procedure: COLONOSCOPY WITH PROPOFOL;  Surgeon: [name], MD;  Location: [facility] ENDOSCOPY;  Service: Endoscopy;  Laterality: N/A;  . COLONOSCOPY WITH PROPOFOL N/A 02/22/2017   Procedure: COLONOSCOPY WITH PROPOFOL;  Surgeon: [name], MD;  Location: [facility] ENDOSCOPY;  Service: Endoscopy;  Laterality: N/A;  . COLONOSCOPY WITH PROPOFOL N/A 05/31/2017   Procedure: COLONOSCOPY WITH PROPOFOL;  Surgeon: [name], MD;  Location: [facility] ENDOSCOPY;  Service: Endoscopy;  Laterality: N/A;  . ESOPHAGOGASTRODUODENOSCOPY (EGD) WITH  PROPOFOL N/A 01/11/2017   Procedure: ESOPHAGOGASTRODUODENOSCOPY (EGD) WITH PROPOFOL;  Surgeon: [name], MD;  Location: [facility] ENDOSCOPY;  Service: Endoscopy;  Laterality: N/A;  . HERNIA REPAIR     umbilical  . HIATAL HERNIA REPAIR    . I&D EXTREMITY Left 06/27/2015   Procedure: IRRIGATION AND DEBRIDEMENT EXTREMITY            ( CALF HEMATOMA ) POSSIBLE WOUND VAC;  Surgeon: [name], MD;  Location: [facility] ORS;  Service: Vascular;  Laterality: Left;  . INCISION AND DRAINAGE OF WOUND Left 09/25/2016   Procedure: IRRIGATION AND DEBRIDEMENT - PARTIAL RESECTION OF ACHILLES TENDON  WITH WOUND VAC APPLICATION;  Surgeon: [name], DPM;  Location: [facility] ORS;  Service: Podiatry;  Laterality: Left;  . IRRIGATION AND DEBRIDEMENT ABSCESS Left 09/07/2016   Procedure: IRRIGATION AND DEBRIDEMENT ABSCESS LEFT HEEL;  Surgeon: [name], DPM;  Location: [facility] OR[facility];  Service: Podiatry;  Laterality: Left;  . JOINT REPLACEMENT  2013   left knee replacement  . KIDNEY SURGERY Right    kidney stones  . LITHOTRIPSY    . LOWER EXTREMITY ANGIOGRAPHY Left 04/04/2018   Procedure: LOWER EXTREMITY ANGIOGRAPHY;  Surgeon: [name], MD;  Location: [facility] CV LAB;  Service: Cardiovascular;  Laterality: Left;  . NM GATED MYOVIEW ([place] HX)  February 2017   Likely breast attenuation. LOW RISK study. Normal EF 55-60%.  . OTHER SURGICAL HISTORY     08/2016 or 09/2016 surgery on achilles tenso h/o staph infection heal. Dr. [name]  . removal of left hematoma Left    leg  . REVERSE SHOULDER ARTHROPLASTY Right 10/08/2015   Procedure: REVERSE SHOULDER ARTHROPLASTY;  Surgeon: [name], MD;  Location: [facility] ORS;  Service: Orthopedics;  Laterality: Right;  . SLEEVE GASTROPLASTY    . TONSILLECTOMY    . TOTAL KNEE ARTHROPLASTY    . TOTAL SHOULDER REPLACEMENT Right   . TRANSESOPHAGEAL ECHOCARDIOGRAM  08/08/2013   Mild LVH, EF 60-65%. Moderate LA dilation and mild RA dilation. Mild MR with no evidence of stenosis and no evidence of endocarditis. A false chordae is noted.  . TRANSTHORACIC ECHOCARDIOGRAM  08/03/2013   Mild-Moderate concentric LVH, EF 60-65%. Normal diastolic function. Mild LA dilation. Mild MR with possible vegetation  - not confirmed on TEE   . ULNAR NERVE TRANSPOSITION Right 08/06/2016   Procedure: ULNAR NERVE DECOMPRESSION/TRANSPOSITION;  Surgeon: [name], MD;  Location: [facility] ORS;  Service: Orthopedics;  Laterality: Right;  . UPPER GI ENDOSCOPY    . VAGINAL HYSTERECTOMY      Family History  Problem Relation Age of Onset  . Skin cancer Father   . Diabetes Father    . Hypertension Father   . Peripheral vascular disease Father   . Cancer Father   . Cerebral aneurysm Father   . Alcohol abuse Father   . Varicose Veins Mother   . Kidney disease Mother   . Arthritis Mother   . Mental illness Sister   . Cancer Maternal Aunt        breast  . Breast cancer Maternal Aunt   . Arthritis Maternal Grandmother   . Hypertension Maternal Grandmother   . Diabetes Maternal Grandmother   . Arthritis Maternal Grandfather   . Heart disease Maternal Grandfather   . Hypertension Maternal Grandfather   . Arthritis Paternal Grandmother   . Hypertension Paternal Grandmother   . Diabetes Paternal Grandmother   . Arthritis Paternal Grandfather   . Hypertension Paternal Grandfather   .  Bladder Cancer Neg Hx   . Kidney cancer Neg Hx     SOCIAL HX: lives at home with husband    Current Outpatient Medications:  .  albuterol (PROVENTIL HFA;VENTOLIN HFA) 108 (90 Base) MCG/ACT inhaler, Inhale 1-2 puffs into the lungs every 4 (four) hours as needed for wheezing or shortness of breath. (Patient taking differently: Inhale 2 puffs into the lungs every 4 (four) hours as needed for wheezing or shortness of breath. ), Disp: 8 g, Rfl: 2 .  albuterol (PROVENTIL) (2.5 MG/3ML) 0.083% nebulizer solution, Take 3 mLs (2.5 mg total) by nebulization every 6 (six) hours as needed for wheezing or shortness of breath., Disp: 150 mL, Rfl: 1 .  amLODipine (NORVASC) 5 MG tablet, Take 1 tablet (5 mg total) by mouth daily., Disp: 90 tablet, Rfl: 3 .  buPROPion (WELLBUTRIN XL) 150 MG 24 hr tablet, Take 1 tablet (150 mg total) by mouth daily., Disp: 90 tablet, Rfl: 3 .  Calcium Citrate-Vitamin D (CALCIUM + D PO), Take 1 tablet by mouth 2 (two) times daily., Disp: , Rfl:  .  cephALEXin (KEFLEX) 500 MG capsule, Take 1 capsule (500 mg total) by mouth 2 times daily at 12 noon and 4 pm., Disp: 76 capsule, Rfl: 0 .  clobetasol cream (TEMOVATE) 1.02 %, Apply 1 application topically 2 (two) times daily. To  arms (Patient taking differently: Apply 1 application topically 2 (two) times daily as needed (irritation). To arms), Disp: 60 g, Rfl: 1 .  dabigatran (PRADAXA) 150 MG CAPS capsule, Take 1 capsule (150 mg total) by mouth 2 (two) times daily., Disp: 180 capsule, Rfl: 1 .  Dexlansoprazole (DEXILANT) 30 MG capsule, Take 1 capsule (30 mg total) by mouth daily. Note lower maintenance dose take 30 min before food lunch or dinner not with thyroid medication (Patient taking differently: Take 60 mg by mouth daily. Note lower maintenance dose take 30 min before food lunch or dinner not with thyroid medication), Disp: 90 capsule, Rfl: 3 .  diclofenac sodium (VOLTAREN) 1 % GEL, Apply 1 application topically daily as needed (pain). , Disp: , Rfl:  .  DULoxetine (CYMBALTA) 60 MG capsule, Take 1 capsule (60 mg total) by mouth daily., Disp: 90 capsule, Rfl: 0 .  estradiol (ESTRACE) 0.1 MG/GM vaginal cream, Apply 0.5mg  (pea-sized amount)  just inside the vaginal introitus with a finger-tip on Monday, Wednesday and Friday nights,, Disp: 42.5 g, Rfl: 11 .  fesoterodine (TOVIAZ) 8 MG TB24 tablet, Take 1 tablet (8 mg total) by mouth daily., Disp: 90 tablet, Rfl: 3 .  fluticasone-salmeterol (ADVAIR HFA) 115-21 MCG/ACT inhaler, Inhale 2 puffs into the lungs daily. Rinse mouth thoroughly after use, Disp: 1 Inhaler, Rfl: 12 .  hydrochlorothiazide (HYDRODIURIL) 12.5 MG tablet, Take 1 tablet (12.5 mg total) by mouth daily. In am, Disp: 90 tablet, Rfl: 3 .  HYDROcodone-acetaminophen (NORCO/VICODIN) 5-325 MG tablet, Take 1 tablet by mouth every 8 (eight) hours as needed for up to 30 days for severe pain. Must last 30 days, Disp: 90 tablet, Rfl: 0 .  [START ON 10/06/2018] HYDROcodone-acetaminophen (NORCO/VICODIN) 5-325 MG tablet, Take 1 tablet by mouth every 8 (eight) hours as needed for up to 30 days for severe pain. Must last 30 days, Disp: 90 tablet, Rfl: 0 .  [START ON 11/05/2018] HYDROcodone-acetaminophen (NORCO/VICODIN) 5-325 MG  tablet, Take 1 tablet by mouth every 8 (eight) hours as needed for up to 30 days for severe pain. Must last 30 days, Disp: 90 tablet, Rfl: 0 .  levocetirizine (  XYZAL) 5 MG tablet, Take 1 tablet (5 mg total) by mouth every evening., Disp: 90 tablet, Rfl: 3 .  levothyroxine (SYNTHROID, LEVOTHROID) 175 MCG tablet, Take 1 tablet (175 mcg total) by mouth daily before breakfast., Disp: 90 tablet, Rfl: 3 .  losartan (COZAAR) 50 MG tablet, Take 1 tablet (50 mg total) by mouth daily. Do not take quinapril 40, Disp: 90 tablet, Rfl: 3 .  meloxicam (MOBIC) 15 MG tablet, Take 1 tablet (15 mg total) by mouth daily as needed for pain., Disp: 30 tablet, Rfl: 2 .  metFORMIN (GLUCOPHAGE) 1000 MG tablet, Take 1 tablet (1,000 mg total) by mouth 2 (two) times daily. With food, Disp: 180 tablet, Rfl: 3 .  montelukast (SINGULAIR) 10 MG tablet, Take 1 tablet (10 mg total) by mouth daily., Disp: 90 tablet, Rfl: 3 .  mupirocin ointment (BACTROBAN) 2 %, Apply 1 application topically 2 (two) times daily. Skin wounds, Disp: 30 g, Rfl: 0 .  nitrofurantoin, macrocrystal-monohydrate, (MACROBID) 100 MG capsule, Take 1 capsule (100 mg total) by mouth every 12 (twelve) hours., Disp: 14 capsule, Rfl: 0 .  nystatin cream (MYCOSTATIN), Apply 1 application topically 2 (two) times daily. Mouth prn for 7 days as needed (Patient taking differently: Apply 1 application topically 2 (two) times daily as needed for dry skin. Mouth prn for 7 days as needed), Disp: 30 g, Rfl: 11 .  ondansetron (ZOFRAN) 4 MG tablet, Take 1 tablet (4 mg total) by mouth every 8 (eight) hours as needed for nausea or vomiting., Disp: 30 tablet, Rfl: 0 .  oxybutynin (DITROPAN XL) 15 MG 24 hr tablet, TAKE ONE TABLET BY MOUTH AT BEDTIME, Disp: 90 tablet, Rfl: 3 .  phenazopyridine (PYRIDIUM) 200 MG tablet, Take 1 tablet (200 mg total) by mouth 3 (three) times daily as needed for pain., Disp: 20 tablet, Rfl: 0 .  pregabalin (LYRICA) 25 MG capsule, Take 1 capsule (25 mg total)  by mouth 2 (two) times daily., Disp: 60 capsule, Rfl: 2 .  zinc oxide 20 % ointment, Apply 1 application topically as needed for irritation., Disp: 56.7 g, Rfl: 0  Current Facility-Administered Medications:  .  ipratropium-albuterol (DUONEB) 0.5-2.5 (3) MG/3ML nebulizer solution 3 mL, 3 mL, Nebulization, Q6H, [name], [name], MD  EXAM:  VITALS per patient if applicable:  GENERAL: alert, oriented, appears well and in no acute distress  HEENT: atraumatic, conjunttiva clear, no obvious abnormalities on inspection of external nose and ears  NECK: normal movements of the head and neck  LUNGS: on inspection no signs of respiratory distress, breathing rate appears normal, no obvious gross SOB, gasping or wheezing  CV: no obvious cyanosis  MS: moves all visible extremities without noticeable abnormality Leg: left leg pain, redness and swelling with h/o DVT   PSYCH/NEURO: pleasant and cooperative, no obvious depression or anxiety, speech and thought processing grossly intact  ASSESSMENT AND PLAN:  Discussed the following assessment and plan:  Acute cystitis without hematuria - Plan: Urine Culture on fosfomycin and keflex 500 mg bid x 1 month  appt with ID upcoming  Leg swelling and pain (ddx infection (OM with chronic wound left heel/cellulitis) r/o DVT) - Plan: US Venous Img Lower Unilateral Left r/o DVT since off pradaxa since 09/25/18  appt 10/18/18 plastics for chronic left heal wound     I discussed the assessment and treatment plan with the patient. The patient was provided an opportunity to ask questions and all were answered. The patient agreed with the plan and demonstrated an understanding  of the instructions.   The patient was advised to call back or seek an in-person evaluation if the symptoms worsen or if the condition fails to improve as anticipated.  Time spent 15 minutes  Delorise Jackson, MD

## 2018-10-04 ENCOUNTER — Telehealth: Payer: Self-pay | Admitting: Internal Medicine

## 2018-10-04 NOTE — Telephone Encounter (Signed)
Urine culture normal no UTI   How is her leg on the left? -negative blood clot -I am concerned with infection if leg continues to swell  [place]

## 2018-10-05 ENCOUNTER — Ambulatory Visit: Payer: PPO | Admitting: Pulmonary Disease

## 2018-10-05 DIAGNOSIS — E1143 Type 2 diabetes mellitus with diabetic autonomic (poly)neuropathy: Secondary | ICD-10-CM | POA: Diagnosis not present

## 2018-10-05 DIAGNOSIS — L97521 Non-pressure chronic ulcer of other part of left foot limited to breakdown of skin: Secondary | ICD-10-CM | POA: Diagnosis not present

## 2018-10-05 NOTE — Telephone Encounter (Signed)
Left message for patient to return call back. [facility] may give results and obtain information.  

## 2018-10-06 ENCOUNTER — Ambulatory Visit: Payer: PPO | Admitting: Infectious Diseases

## 2018-10-10 ENCOUNTER — Ambulatory Visit: Payer: Self-pay | Admitting: Urology

## 2018-10-10 DIAGNOSIS — E1151 Type 2 diabetes mellitus with diabetic peripheral angiopathy without gangrene: Secondary | ICD-10-CM | POA: Diagnosis not present

## 2018-10-10 DIAGNOSIS — I10 Essential (primary) hypertension: Secondary | ICD-10-CM | POA: Diagnosis not present

## 2018-10-10 DIAGNOSIS — E11621 Type 2 diabetes mellitus with foot ulcer: Secondary | ICD-10-CM | POA: Diagnosis not present

## 2018-10-10 DIAGNOSIS — L97421 Non-pressure chronic ulcer of left heel and midfoot limited to breakdown of skin: Secondary | ICD-10-CM | POA: Diagnosis not present

## 2018-10-10 DIAGNOSIS — E1143 Type 2 diabetes mellitus with diabetic autonomic (poly)neuropathy: Secondary | ICD-10-CM | POA: Diagnosis not present

## 2018-10-10 DIAGNOSIS — Z96611 Presence of right artificial shoulder joint: Secondary | ICD-10-CM | POA: Diagnosis not present

## 2018-10-10 DIAGNOSIS — Z96659 Presence of unspecified artificial knee joint: Secondary | ICD-10-CM | POA: Diagnosis not present

## 2018-10-10 DIAGNOSIS — D649 Anemia, unspecified: Secondary | ICD-10-CM | POA: Diagnosis not present

## 2018-10-10 DIAGNOSIS — I4891 Unspecified atrial fibrillation: Secondary | ICD-10-CM | POA: Diagnosis not present

## 2018-10-10 DIAGNOSIS — Z7951 Long term (current) use of inhaled steroids: Secondary | ICD-10-CM | POA: Diagnosis not present

## 2018-10-10 DIAGNOSIS — K219 Gastro-esophageal reflux disease without esophagitis: Secondary | ICD-10-CM | POA: Diagnosis not present

## 2018-10-10 DIAGNOSIS — E039 Hypothyroidism, unspecified: Secondary | ICD-10-CM | POA: Diagnosis not present

## 2018-10-10 DIAGNOSIS — Z86718 Personal history of other venous thrombosis and embolism: Secondary | ICD-10-CM | POA: Diagnosis not present

## 2018-10-10 DIAGNOSIS — Z87440 Personal history of urinary (tract) infections: Secondary | ICD-10-CM | POA: Diagnosis not present

## 2018-10-10 DIAGNOSIS — L97411 Non-pressure chronic ulcer of right heel and midfoot limited to breakdown of skin: Secondary | ICD-10-CM | POA: Diagnosis not present

## 2018-10-10 DIAGNOSIS — Z7984 Long term (current) use of oral hypoglycemic drugs: Secondary | ICD-10-CM | POA: Diagnosis not present

## 2018-10-10 DIAGNOSIS — Z7901 Long term (current) use of anticoagulants: Secondary | ICD-10-CM | POA: Diagnosis not present

## 2018-10-10 DIAGNOSIS — J449 Chronic obstructive pulmonary disease, unspecified: Secondary | ICD-10-CM | POA: Diagnosis not present

## 2018-10-10 DIAGNOSIS — L97322 Non-pressure chronic ulcer of left ankle with fat layer exposed: Secondary | ICD-10-CM | POA: Diagnosis not present

## 2018-10-10 DIAGNOSIS — I872 Venous insufficiency (chronic) (peripheral): Secondary | ICD-10-CM | POA: Diagnosis not present

## 2018-10-10 DIAGNOSIS — F329 Major depressive disorder, single episode, unspecified: Secondary | ICD-10-CM | POA: Diagnosis not present

## 2018-10-10 DIAGNOSIS — E785 Hyperlipidemia, unspecified: Secondary | ICD-10-CM | POA: Diagnosis not present

## 2018-10-10 DIAGNOSIS — M6281 Muscle weakness (generalized): Secondary | ICD-10-CM | POA: Diagnosis not present

## 2018-10-10 DIAGNOSIS — R32 Unspecified urinary incontinence: Secondary | ICD-10-CM | POA: Diagnosis not present

## 2018-10-11 ENCOUNTER — Ambulatory Visit: Payer: PPO | Admitting: Infectious Diseases

## 2018-10-18 ENCOUNTER — Other Ambulatory Visit: Payer: Self-pay

## 2018-10-18 ENCOUNTER — Ambulatory Visit (INDEPENDENT_AMBULATORY_CARE_PROVIDER_SITE_OTHER): Payer: PPO | Admitting: Plastic Surgery

## 2018-10-18 ENCOUNTER — Encounter: Payer: Self-pay | Admitting: Plastic Surgery

## 2018-10-18 VITALS — BP 163/87 | HR 69 | Temp 97.6°F | Ht 66.0 in | Wt 252.0 lb

## 2018-10-18 DIAGNOSIS — L97919 Non-pressure chronic ulcer of unspecified part of right lower leg with unspecified severity: Secondary | ICD-10-CM | POA: Insufficient documentation

## 2018-10-18 DIAGNOSIS — I739 Peripheral vascular disease, unspecified: Secondary | ICD-10-CM | POA: Diagnosis not present

## 2018-10-18 DIAGNOSIS — L97522 Non-pressure chronic ulcer of other part of left foot with fat layer exposed: Secondary | ICD-10-CM | POA: Diagnosis not present

## 2018-10-18 DIAGNOSIS — L97911 Non-pressure chronic ulcer of unspecified part of right lower leg limited to breakdown of skin: Secondary | ICD-10-CM

## 2018-10-18 DIAGNOSIS — L97422 Non-pressure chronic ulcer of left heel and midfoot with fat layer exposed: Secondary | ICD-10-CM

## 2018-10-18 DIAGNOSIS — E11621 Type 2 diabetes mellitus with foot ulcer: Secondary | ICD-10-CM

## 2018-10-18 DIAGNOSIS — E1151 Type 2 diabetes mellitus with diabetic peripheral angiopathy without gangrene: Secondary | ICD-10-CM | POA: Diagnosis not present

## 2018-10-18 NOTE — Progress Notes (Signed)
Patient ID: [name], female    DOB: 1950-04-07, 68 y.o.   MRN: [id number]   Chief Complaint  Patient presents with   Advice Only    for (L) heel wound and ulcer on the bottom of (L) foot    The patient is a 68 year old white female here for evaluation of her lower extremity ulcers.  She has had excision of the left foot ulcer in the past.  She has been dealing with this for over a year.  She has multiple medical conditions including diabetes, obstructive sleep apnea, atrial fibrillation and spinal stenosis.  She is on multiple medications as seen below.  She is 5 feet 6 inches tall and weighs 250 pounds.  She walks with a walker.  The wound is on the heel of the left foot it is 1.5 x 1.5 cm and likely at least 1 cm deep.  She has multiple wounds on the legs.  There is one that is 2 cm on the right medial lower leg.  Both legs have severe hemosiderosis and a woody type texture.  She has clearly been suffering from peripheral vascular disease.  The patient's overall condition is severe.  I am willing to try and help.  Patient states her last hemoglobin A1c was in the 6 range.   Review of Systems  Constitutional: Positive for activity change. Negative for appetite change.  HENT: Negative.   Eyes: Negative.   Respiratory: Positive for shortness of breath.   Cardiovascular: Positive for leg swelling.  Gastrointestinal: Negative for abdominal pain.  Endocrine: Negative.   Genitourinary: Negative.   Musculoskeletal: Positive for back pain.  Psychiatric/Behavioral: Negative.     Past Medical History:  Diagnosis Date   Abnormal antibody titer    Anxiety and depression    Arthritis    Asthma    Cystocele    Depression    Diabetes ([place])    DVT (deep venous thrombosis) (HCC)    Righ calf   Dysrhythmia    Edema    GERD (gastroesophageal reflux disease)    Heart murmur    Echocardiogram June 2015: Mild MR (possible vegetation seen on TTE, not seen on TEE), normal LV  size with moderate concentric LVH. Normal function EF 60-65%. Normal diastolic function. Mild LA dilation.   History of IBS    History of methicillin resistant staphylococcus aureus (MRSA) 2008   Hyperlipidemia    Hypertension    Hypothyroidism    Insomnia    Kidney stone    kidney stones with lithotripsy   Neuropathy involving both lower extremities    Obesity, Class II, BMI 35-39.9, with comorbidity    Paroxysmal atrial fibrillation ([place]) 2007   In June 2015, Cardiac Event Monitor: Mostly SR/sinus arrhythmia with PVCs that are frequent. Short bursts of A. fib lasting several minutes;; CHA2DS2-VASc Score = 5 (age, Female, PVD, DM, HTN)   Peripheral vascular disease ([place])    Sleep apnea    use C-PAP   Urinary incontinence    Venous stasis dermatitis of both lower extremities     Past Surgical History:  Procedure Laterality Date   ANKLE SURGERY Right    APPLICATION OF WOUND VAC Left 06/27/2015   Procedure: APPLICATION OF WOUND VAC ( POSSIBLE ) ;  Surgeon: [name], MD;  Location: [facility] ORS;  Service: Vascular;  Laterality: Left;   APPLICATION OF WOUND VAC Left 09/25/2016   Procedure: APPLICATION OF WOUND VAC;  Surgeon: [name], DPM;  Location: [facility]  ORS;  Service: Podiatry;  Laterality: Left;   arm surgery     right     CHOLECYSTECTOMY     COLONOSCOPY     COLONOSCOPY WITH PROPOFOL N/A 01/11/2017   Procedure: COLONOSCOPY WITH PROPOFOL;  Surgeon: [name], MD;  Location: [facility] ENDOSCOPY;  Service: Endoscopy;  Laterality: N/A;   COLONOSCOPY WITH PROPOFOL N/A 02/22/2017   Procedure: COLONOSCOPY WITH PROPOFOL;  Surgeon: [name], MD;  Location: [facility] ENDOSCOPY;  Service: Endoscopy;  Laterality: N/A;   COLONOSCOPY WITH PROPOFOL N/A 05/31/2017   Procedure: COLONOSCOPY WITH PROPOFOL;  Surgeon: [name], MD;  Location: [facility] ENDOSCOPY;  Service: Endoscopy;  Laterality: N/A;   ESOPHAGOGASTRODUODENOSCOPY (EGD) WITH PROPOFOL N/A 01/11/2017    Procedure: ESOPHAGOGASTRODUODENOSCOPY (EGD) WITH PROPOFOL;  Surgeon: [name], MD;  Location: [facility] ENDOSCOPY;  Service: Endoscopy;  Laterality: N/A;   HERNIA REPAIR     umbilical   HIATAL HERNIA REPAIR     I&D EXTREMITY Left 06/27/2015   Procedure: IRRIGATION AND DEBRIDEMENT EXTREMITY            ( CALF HEMATOMA ) POSSIBLE WOUND VAC;  Surgeon: [name], MD;  Location: [facility] ORS;  Service: Vascular;  Laterality: Left;   INCISION AND DRAINAGE OF WOUND Left 09/25/2016   Procedure: IRRIGATION AND DEBRIDEMENT - PARTIAL RESECTION OF ACHILLES TENDON WITH WOUND VAC APPLICATION;  Surgeon: [name], DPM;  Location: [facility] ORS;  Service: Podiatry;  Laterality: Left;   IRRIGATION AND DEBRIDEMENT ABSCESS Left 09/07/2016   Procedure: IRRIGATION AND DEBRIDEMENT ABSCESS LEFT HEEL;  Surgeon: [name], DPM;  Location: [facility] ORS;  Service: Podiatry;  Laterality: Left;   JOINT REPLACEMENT  2013   left knee replacement   KIDNEY SURGERY Right    kidney stones   LITHOTRIPSY     LOWER EXTREMITY ANGIOGRAPHY Left 04/04/2018   Procedure: LOWER EXTREMITY ANGIOGRAPHY;  Surgeon: [name], MD;  Location: [place] [facility];  Service: Cardiovascular;  Laterality: Left;   NM GATED MYOVIEW [facility] HX)  February 2017   Likely breast attenuation. LOW RISK study. Normal EF 55-60%.   OTHER SURGICAL HISTORY     08/2016 or 09/2016 surgery on achilles tenso h/o staph infection heal. Dr. [name]   removal of left hematoma Left    leg   REVERSE SHOULDER ARTHROPLASTY Right 10/08/2015   Procedure: REVERSE SHOULDER ARTHROPLASTY;  Surgeon: [name], MD;  Location: [facility] ORS;  Service: Orthopedics;  Laterality: Right;   SLEEVE GASTROPLASTY     TONSILLECTOMY     TOTAL KNEE ARTHROPLASTY     TOTAL SHOULDER REPLACEMENT Right    TRANSESOPHAGEAL ECHOCARDIOGRAM  08/08/2013   Mild LVH, EF 60-65%. Moderate LA dilation and mild RA dilation. Mild MR with no evidence of stenosis and no evidence of  endocarditis. A false chordae is noted.   TRANSTHORACIC ECHOCARDIOGRAM  08/03/2013   Mild-Moderate concentric LVH, EF 60-65%. Normal diastolic function. Mild LA dilation. Mild MR with possible vegetation  - not confirmed on TEE    ULNAR NERVE TRANSPOSITION Right 08/06/2016   Procedure: ULNAR NERVE DECOMPRESSION/TRANSPOSITION;  Surgeon: [name], MD;  Location: [facility] ORS;  Service: Orthopedics;  Laterality: Right;   UPPER GI ENDOSCOPY     VAGINAL HYSTERECTOMY        Current Outpatient Medications:    albuterol (PROVENTIL HFA;VENTOLIN HFA) 108 (90 Base) MCG/ACT inhaler, Inhale 1-2 puffs into the lungs every 4 (four) hours as needed for wheezing or shortness of breath. (Patient taking differently: Inhale 2  puffs into the lungs every 4 (four) hours as needed for wheezing or shortness of breath. ), Disp: 8 g, Rfl: 2   albuterol (PROVENTIL) (2.5 MG/3ML) 0.083% nebulizer solution, Take 3 mLs (2.5 mg total) by nebulization every 6 (six) hours as needed for wheezing or shortness of breath., Disp: 150 mL, Rfl: 1   amLODipine (NORVASC) 5 MG tablet, Take 1 tablet (5 mg total) by mouth daily., Disp: 90 tablet, Rfl: 3   buPROPion (WELLBUTRIN XL) 150 MG 24 hr tablet, Take 1 tablet (150 mg total) by mouth daily., Disp: 90 tablet, Rfl: 3   Calcium Citrate-Vitamin D (CALCIUM + D PO), Take 1 tablet by mouth 2 (two) times daily., Disp: , Rfl:    cephALEXin (KEFLEX) 500 MG capsule, Take 1 capsule (500 mg total) by mouth 2 times daily at 12 noon and 4 pm., Disp: 76 capsule, Rfl: 0   clobetasol cream (TEMOVATE) 6.19 %, Apply 1 application topically 2 (two) times daily. To arms (Patient taking differently: Apply 1 application topically 2 (two) times daily as needed (irritation). To arms), Disp: 60 g, Rfl: 1   dabigatran (PRADAXA) 150 MG CAPS capsule, Take 1 capsule (150 mg total) by mouth 2 (two) times daily., Disp: 180 capsule, Rfl: 1   Dexlansoprazole (DEXILANT) 30 MG capsule, Take 1 capsule (30 mg  total) by mouth daily. Note lower maintenance dose take 30 min before food lunch or dinner not with thyroid medication (Patient taking differently: Take 60 mg by mouth daily. Note lower maintenance dose take 30 min before food lunch or dinner not with thyroid medication), Disp: 90 capsule, Rfl: 3   diclofenac sodium (VOLTAREN) 1 % GEL, Apply 1 application topically daily as needed (pain). , Disp: , Rfl:    doxycycline (VIBRAMYCIN) 100 MG capsule, As directed., Disp: , Rfl:    DULoxetine (CYMBALTA) 60 MG capsule, Take 1 capsule (60 mg total) by mouth daily., Disp: 90 capsule, Rfl: 0   estradiol (ESTRACE) 0.1 MG/GM vaginal cream, Apply 0.5mg  (pea-sized amount)  just inside the vaginal introitus with a finger-tip on Monday, Wednesday and Friday nights,, Disp: 42.5 g, Rfl: 11   fesoterodine (TOVIAZ) 8 MG TB24 tablet, Take 1 tablet (8 mg total) by mouth daily., Disp: 90 tablet, Rfl: 3   fluticasone-salmeterol (ADVAIR HFA) 115-21 MCG/ACT inhaler, Inhale 2 puffs into the lungs daily. Rinse mouth thoroughly after use, Disp: 1 Inhaler, Rfl: 12   hydrochlorothiazide (HYDRODIURIL) 12.5 MG tablet, Take 1 tablet (12.5 mg total) by mouth daily. In am, Disp: 90 tablet, Rfl: 3   HYDROcodone-acetaminophen (NORCO/VICODIN) 5-325 MG tablet, Take 1 tablet by mouth every 8 (eight) hours as needed for up to 30 days for severe pain. Must last 30 days, Disp: 90 tablet, Rfl: 0   [START ON 11/05/2018] HYDROcodone-acetaminophen (NORCO/VICODIN) 5-325 MG tablet, Take 1 tablet by mouth every 8 (eight) hours as needed for up to 30 days for severe pain. Must last 30 days, Disp: 90 tablet, Rfl: 0   levocetirizine (XYZAL) 5 MG tablet, Take 1 tablet (5 mg total) by mouth every evening., Disp: 90 tablet, Rfl: 3   levothyroxine (SYNTHROID, LEVOTHROID) 175 MCG tablet, Take 1 tablet (175 mcg total) by mouth daily before breakfast., Disp: 90 tablet, Rfl: 3   losartan (COZAAR) 50 MG tablet, Take 1 tablet (50 mg total) by mouth daily.  Do not take quinapril 40, Disp: 90 tablet, Rfl: 3   meloxicam (MOBIC) 15 MG tablet, Take 1 tablet (15 mg total) by mouth daily as needed for  pain., Disp: 30 tablet, Rfl: 2   metFORMIN (GLUCOPHAGE) 1000 MG tablet, Take 1 tablet (1,000 mg total) by mouth 2 (two) times daily. With food, Disp: 180 tablet, Rfl: 3   montelukast (SINGULAIR) 10 MG tablet, Take 1 tablet (10 mg total) by mouth daily., Disp: 90 tablet, Rfl: 3   mupirocin ointment (BACTROBAN) 2 %, Apply 1 application topically 2 (two) times daily. Skin wounds, Disp: 30 g, Rfl: 0   nystatin cream (MYCOSTATIN), Apply 1 application topically 2 (two) times daily. Mouth prn for 7 days as needed (Patient taking differently: Apply 1 application topically 2 (two) times daily as needed for dry skin. Mouth prn for 7 days as needed), Disp: 30 g, Rfl: 11   ondansetron (ZOFRAN) 4 MG tablet, Take 1 tablet (4 mg total) by mouth every 8 (eight) hours as needed for nausea or vomiting., Disp: 30 tablet, Rfl: 0   oxybutynin (DITROPAN XL) 15 MG 24 hr tablet, TAKE ONE TABLET BY MOUTH AT BEDTIME, Disp: 90 tablet, Rfl: 3   phenazopyridine (PYRIDIUM) 200 MG tablet, Take 1 tablet (200 mg total) by mouth 3 (three) times daily as needed for pain., Disp: 20 tablet, Rfl: 0   pregabalin (LYRICA) 25 MG capsule, Take 1 capsule (25 mg total) by mouth 2 (two) times daily., Disp: 60 capsule, Rfl: 2   zinc oxide 20 % ointment, Apply 1 application topically as needed for irritation., Disp: 56.7 g, Rfl: 0   HYDROcodone-acetaminophen (NORCO/VICODIN) 5-325 MG tablet, Take 1 tablet by mouth every 8 (eight) hours as needed for up to 30 days for severe pain. Must last 30 days, Disp: 90 tablet, Rfl: 0  Current Facility-Administered Medications:    ipratropium-albuterol (DUONEB) 0.5-2.5 (3) MG/3ML nebulizer solution 3 mL, 3 mL, Nebulization, Q6H, [name], [name], MD   Objective:   Vitals:   10/18/18 1124  BP: (!) 163/87  Pulse: 69  Temp: 97.6 F (36.4 C)  SpO2:  94%    Physical Exam Vitals signs and nursing note reviewed.  Constitutional:      Appearance: Normal appearance.  HENT:     Head: Normocephalic and atraumatic.  Eyes:     Extraocular Movements: Extraocular movements intact.  Cardiovascular:     Rate and Rhythm: Normal rate.     Pulses: Normal pulses.  Pulmonary:     Effort: Pulmonary effort is normal.  Abdominal:     General: Abdomen is flat. There is no distension.  Musculoskeletal:       Feet:  Neurological:     General: No focal deficit present.     Mental Status: She is alert and oriented to person, place, and time.  Psychiatric:        Mood and Affect: Mood normal.        Behavior: Behavior normal.        Thought Content: Thought content normal.     Assessment & Plan:  Skin ulcer of left foot with fat layer exposed ([place])  Peripheral vascular disease ([place])  DM (diabetes mellitus) type II, controlled, with peripheral vascular disorder ([facility])  Chronic ulcer of right leg, limited to breakdown of skin ([place])  Diabetic ulcer of left heel associated with type 2 diabetes mellitus, with fat layer exposed ([place]) Recommend excision of the left foot wound with ACell placement.  She will need to be off her foot for at least 2 weeks.  Pictures were obtained of the patient and placed in the chart with the patient's or guardian's permission.  Loel Lofty Zelie Asbill,  DO

## 2018-10-19 DIAGNOSIS — L97521 Non-pressure chronic ulcer of other part of left foot limited to breakdown of skin: Secondary | ICD-10-CM | POA: Diagnosis not present

## 2018-10-19 DIAGNOSIS — E1143 Type 2 diabetes mellitus with diabetic autonomic (poly)neuropathy: Secondary | ICD-10-CM | POA: Diagnosis not present

## 2018-10-20 ENCOUNTER — Ambulatory Visit: Payer: PPO | Admitting: Infectious Diseases

## 2018-10-21 ENCOUNTER — Other Ambulatory Visit
Admission: RE | Admit: 2018-10-21 | Discharge: 2018-10-21 | Disposition: A | Discharge status: Home / Self Care | Payer: PPO | Source: Ambulatory Visit | Attending: Gastroenterology | Admitting: Gastroenterology

## 2018-10-21 ENCOUNTER — Other Ambulatory Visit: Payer: Self-pay

## 2018-10-21 DIAGNOSIS — Z20828 Contact with and (suspected) exposure to other viral communicable diseases: Secondary | ICD-10-CM | POA: Insufficient documentation

## 2018-10-22 LAB — SARS CORONAVIRUS 2 (TAT 6-24 HRS): SARS Coronavirus 2: NEGATIVE

## 2018-10-24 ENCOUNTER — Other Ambulatory Visit: Payer: Self-pay

## 2018-10-24 ENCOUNTER — Encounter: Payer: Self-pay | Admitting: Urology

## 2018-10-24 ENCOUNTER — Ambulatory Visit (INDEPENDENT_AMBULATORY_CARE_PROVIDER_SITE_OTHER): Payer: PPO | Admitting: Urology

## 2018-10-24 VITALS — BP 143/86 | HR 76 | Ht 66.0 in | Wt 250.0 lb

## 2018-10-24 DIAGNOSIS — E785 Hyperlipidemia, unspecified: Secondary | ICD-10-CM | POA: Diagnosis not present

## 2018-10-24 DIAGNOSIS — N390 Urinary tract infection, site not specified: Secondary | ICD-10-CM

## 2018-10-24 DIAGNOSIS — Z7984 Long term (current) use of oral hypoglycemic drugs: Secondary | ICD-10-CM | POA: Diagnosis not present

## 2018-10-24 DIAGNOSIS — Z87440 Personal history of urinary (tract) infections: Secondary | ICD-10-CM | POA: Diagnosis not present

## 2018-10-24 DIAGNOSIS — K219 Gastro-esophageal reflux disease without esophagitis: Secondary | ICD-10-CM | POA: Diagnosis not present

## 2018-10-24 DIAGNOSIS — E1151 Type 2 diabetes mellitus with diabetic peripheral angiopathy without gangrene: Secondary | ICD-10-CM | POA: Diagnosis not present

## 2018-10-24 DIAGNOSIS — Z96611 Presence of right artificial shoulder joint: Secondary | ICD-10-CM | POA: Diagnosis not present

## 2018-10-24 DIAGNOSIS — I872 Venous insufficiency (chronic) (peripheral): Secondary | ICD-10-CM | POA: Diagnosis not present

## 2018-10-24 DIAGNOSIS — J449 Chronic obstructive pulmonary disease, unspecified: Secondary | ICD-10-CM | POA: Diagnosis not present

## 2018-10-24 DIAGNOSIS — Z86718 Personal history of other venous thrombosis and embolism: Secondary | ICD-10-CM | POA: Diagnosis not present

## 2018-10-24 DIAGNOSIS — Z96659 Presence of unspecified artificial knee joint: Secondary | ICD-10-CM | POA: Diagnosis not present

## 2018-10-24 DIAGNOSIS — E1143 Type 2 diabetes mellitus with diabetic autonomic (poly)neuropathy: Secondary | ICD-10-CM | POA: Diagnosis not present

## 2018-10-24 DIAGNOSIS — M6281 Muscle weakness (generalized): Secondary | ICD-10-CM | POA: Diagnosis not present

## 2018-10-24 DIAGNOSIS — E039 Hypothyroidism, unspecified: Secondary | ICD-10-CM | POA: Diagnosis not present

## 2018-10-24 DIAGNOSIS — L97411 Non-pressure chronic ulcer of right heel and midfoot limited to breakdown of skin: Secondary | ICD-10-CM | POA: Diagnosis not present

## 2018-10-24 DIAGNOSIS — I10 Essential (primary) hypertension: Secondary | ICD-10-CM | POA: Diagnosis not present

## 2018-10-24 DIAGNOSIS — R32 Unspecified urinary incontinence: Secondary | ICD-10-CM | POA: Diagnosis not present

## 2018-10-24 DIAGNOSIS — Z7951 Long term (current) use of inhaled steroids: Secondary | ICD-10-CM | POA: Diagnosis not present

## 2018-10-24 DIAGNOSIS — D649 Anemia, unspecified: Secondary | ICD-10-CM | POA: Diagnosis not present

## 2018-10-24 DIAGNOSIS — Z7901 Long term (current) use of anticoagulants: Secondary | ICD-10-CM | POA: Diagnosis not present

## 2018-10-24 DIAGNOSIS — L97421 Non-pressure chronic ulcer of left heel and midfoot limited to breakdown of skin: Secondary | ICD-10-CM | POA: Diagnosis not present

## 2018-10-24 DIAGNOSIS — F329 Major depressive disorder, single episode, unspecified: Secondary | ICD-10-CM | POA: Diagnosis not present

## 2018-10-24 DIAGNOSIS — E11621 Type 2 diabetes mellitus with foot ulcer: Secondary | ICD-10-CM | POA: Diagnosis not present

## 2018-10-24 DIAGNOSIS — I4891 Unspecified atrial fibrillation: Secondary | ICD-10-CM | POA: Diagnosis not present

## 2018-10-24 DIAGNOSIS — L97322 Non-pressure chronic ulcer of left ankle with fat layer exposed: Secondary | ICD-10-CM | POA: Diagnosis not present

## 2018-10-24 LAB — MICROSCOPIC EXAMINATION: RBC, Urine: NONE SEEN /hpf (ref 0–2)

## 2018-10-24 LAB — URINALYSIS, COMPLETE
Bilirubin, UA: NEGATIVE
Glucose, UA: NEGATIVE
Leukocytes,UA: NEGATIVE
Nitrite, UA: NEGATIVE
RBC, UA: NEGATIVE
Specific Gravity, UA: 1.025 (ref 1.005–1.030)
Urobilinogen, Ur: 0.2 mg/dL (ref 0.2–1.0)
pH, UA: 5 (ref 5.0–7.5)

## 2018-10-24 IMAGING — DX DG CHEST 1V
1 series · 1 of 1 positions shown · non-contrast
Comparison: 06/09/2016

CLINICAL DATA: Difficulty breathing after anesthesia today.

EXAM:
CHEST 1 VIEW

[chest ap]
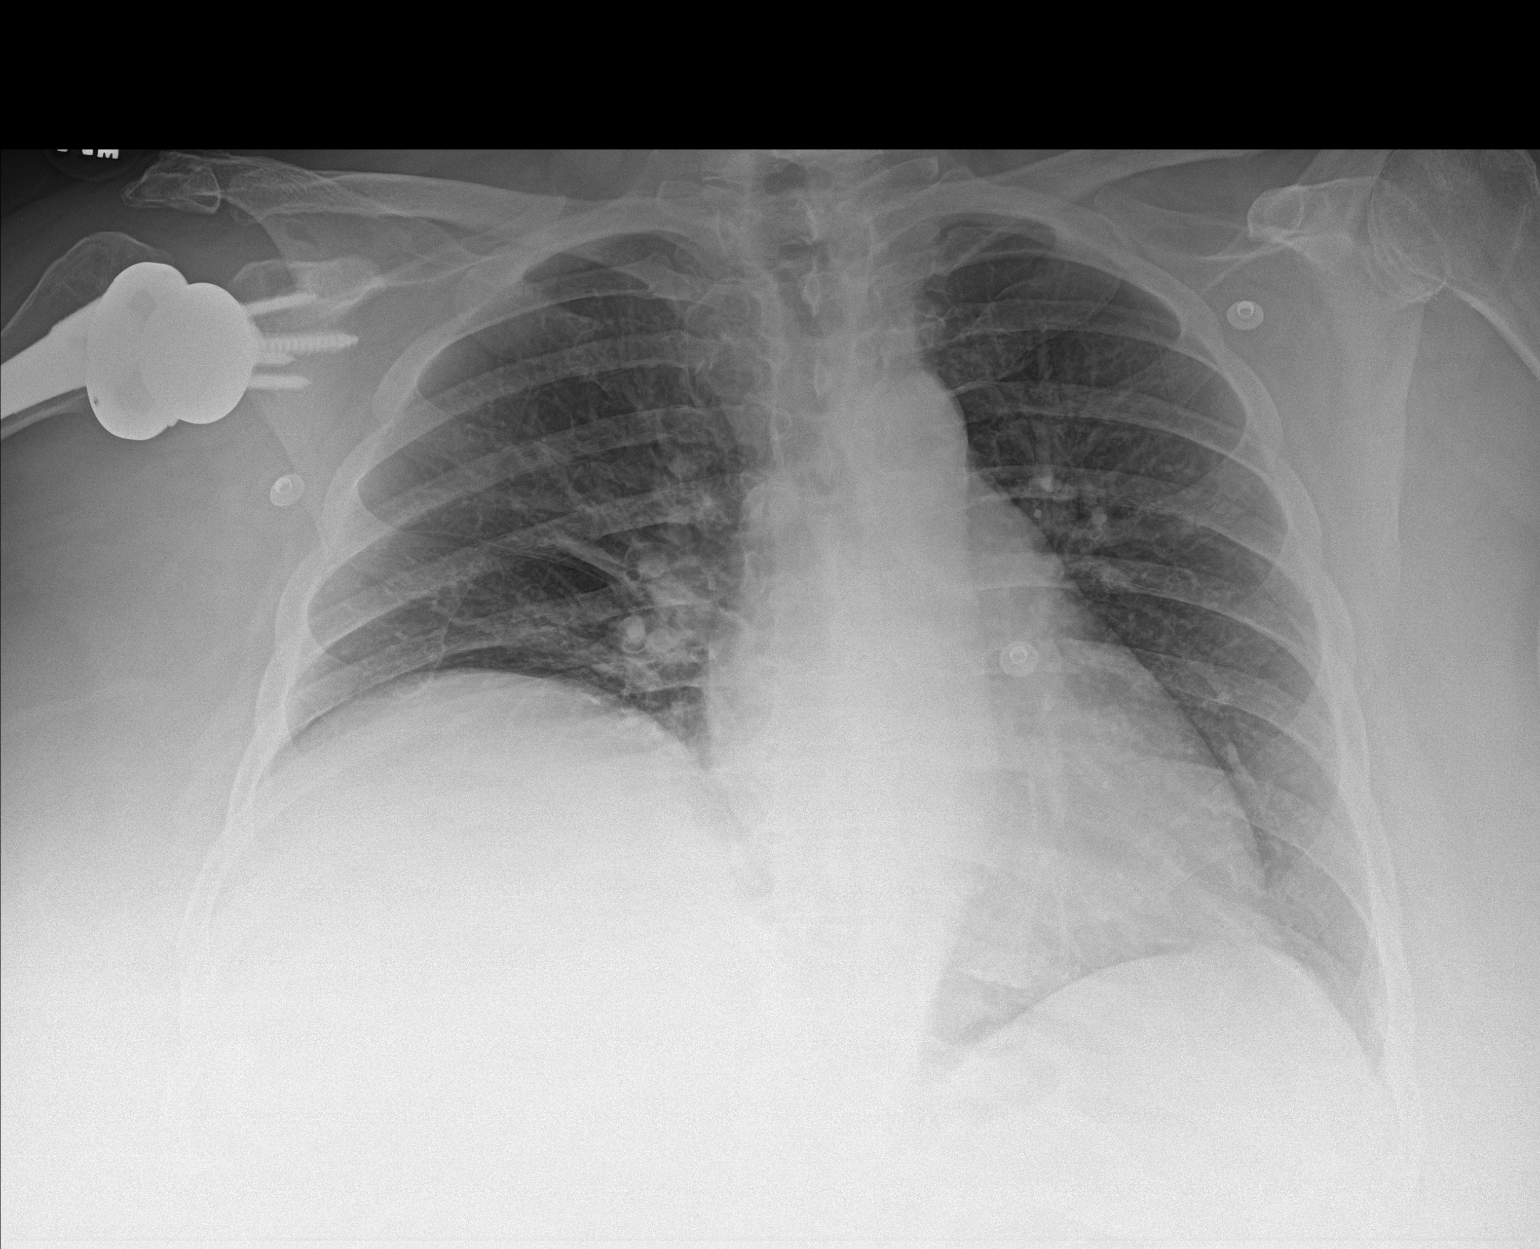

[1 of 1 positions shown; findings below may reference images not displayed]

FINDINGS: The cardiomediastinal silhouette is unchanged, with the heart being
upper limits of normal in size. There is moderate chronic elevation
of the right hemidiaphragm. No airspace consolidation, edema,
pleural effusion, or pneumothorax is identified. Prior right
shoulder arthroplasty and thoracic spondylosis are noted.
IMPRESSION: No active disease.

## 2018-10-24 NOTE — Progress Notes (Signed)
10/24/2018 1:55 PM   [name] 1950/10/12 [id number]  Referring provider: [name], [name], MD [place],  [place] [address]  No chief complaint on file.   HPI: The patient has been followed in clinic and had failed Myrbetriq.  She was a partial responder to oxybutynin and last time I added Toviaz.  She is at least 50% better.  She still strains to urinate.  We had computer issues the day I saw her  She has mixed incontinence and mild bedwetting.  She has moderate stress incontinence by urodynamics and history.  She has foot on the floor syndrome.  She is getting recurrent bladder infections and was given fosfomycin in the last several months for resistant positive culture  On urodynamics she had a stable bladder at a capacity of 800 mL and her Valsalva leak point pressure range between 38 and 80 cm of water  She is on a blood thinner and has a lot of medical issues and is obese.  Clinically not infected today and urine sent for culture  I reviewed mixed incontinence and she primarily has an overactive bladder.  I am very concerned and I do not think she did have a sling based on the mixed component her medical issues blood thinners and she strains to urinate.  I do not think urethral injectable is ideal but always could be entertained but she does have a complicated presentation.  I went over percutaneous tibial nerve stimulation and she put on hold for now.  I think we need to have reasonable treatment goals   PMH: Past Medical History:  Diagnosis Date  . Abnormal antibody titer   . Anxiety and depression   . Arthritis   . Asthma   . Cystocele   . Depression   . Diabetes ([place])   . DVT (deep venous thrombosis) (HCC)    Righ calf  . Dysrhythmia   . Edema   . GERD (gastroesophageal reflux disease)   . Heart murmur    Echocardiogram June 2015: Mild MR (possible vegetation seen on TTE, not seen on TEE), normal LV size with moderate concentric LVH.  Normal function EF 60-65%. Normal diastolic function. Mild LA dilation.  Marland Kitchen History of IBS   . History of methicillin resistant staphylococcus aureus (MRSA) 2008  . Hyperlipidemia   . Hypertension   . Hypothyroidism   . Insomnia   . Kidney stone    kidney stones with lithotripsy  . Neuropathy involving both lower extremities   . Obesity, Class II, BMI 35-39.9, with comorbidity   . Paroxysmal atrial fibrillation ([place]) 2007   In June 2015, Cardiac Event Monitor: Mostly SR/sinus arrhythmia with PVCs that are frequent. Short bursts of A. fib lasting several minutes;; CHA2DS2-VASc Score = 5 (age, Female, PVD, DM, HTN)  . Peripheral vascular disease ([place])   . Sleep apnea    use C-PAP  . Urinary incontinence   . Venous stasis dermatitis of both lower extremities     Surgical History: Past Surgical History:  Procedure Laterality Date  . ANKLE SURGERY Right   . APPLICATION OF WOUND VAC Left 06/27/2015   Procedure: APPLICATION OF WOUND VAC ( POSSIBLE ) ;  Surgeon: [name], MD;  Location: [facility] ORS;  Service: Vascular;  Laterality: Left;  . APPLICATION OF WOUND VAC Left 09/25/2016   Procedure: APPLICATION OF WOUND VAC;  Surgeon: [name], DPM;  Location: [facility];  Service: Podiatry;  Laterality: Left;  . arm surgery  right    . CHOLECYSTECTOMY    . COLONOSCOPY    . COLONOSCOPY WITH PROPOFOL N/A 01/11/2017   Procedure: COLONOSCOPY WITH PROPOFOL;  Surgeon: [name], MD;  Location: [facility] ENDOSCOPY;  Service: Endoscopy;  Laterality: N/A;  . COLONOSCOPY WITH PROPOFOL N/A 02/22/2017   Procedure: COLONOSCOPY WITH PROPOFOL;  Surgeon: [name], MD;  Location: [facility]COPY;  Service: Endoscopy;  Laterality: N/A;  . COLONOSCOPY WITH PROPOFOL N/A 05/31/2017   Procedure: COLONOSCOPY WITH PROPOFOL;  Surgeon: [name], MD;  Location: [facility]COPY;  Service: Endoscopy;  Laterality: N/A;  . ESOPHAGOGASTRODUODENOSCOPY (EGD) WITH PROPOFOL N/A 01/11/2017   Procedure:  ESOPHAGOGASTRODUODENOSCOPY (EGD) WITH PROPOFOL;  Surgeon: [name], MD;  Location: [facility] ENDOSCOPY;  Service: Endoscopy;  Laterality: N/A;  . HERNIA REPAIR     umbilical  . HIATAL HERNIA REPAIR    . I&D EXTREMITY Left 06/27/2015   Procedure: IRRIGATION AND DEBRIDEMENT EXTREMITY            ( CALF HEMATOMA ) POSSIBLE WOUND VAC;  Surgeon: [name], MD;  Location: [facility];  Service: Vascular;  Laterality: Left;  . INCISION AND DRAINAGE OF WOUND Left 09/25/2016   Procedure: IRRIGATION AND DEBRIDEMENT - PARTIAL RESECTION OF ACHILLES TENDON WITH WOUND VAC APPLICATION;  Surgeon: [name], DPM;  Location: [facility]S;  Service: Podiatry;  Laterality: Left;  . IRRIGATION AND DEBRIDEMENT ABSCESS Left 09/07/2016   Procedure: IRRIGATION AND DEBRIDEMENT ABSCESS LEFT HEEL;  Surgeon: [name], DPM;  Location: [facility];  Service: Podiatry;  Laterality: Left;  . JOINT REPLACEMENT  2013   left knee replacement  . KIDNEY SURGERY Right    kidney stones  . LITHOTRIPSY    . LOWER EXTREMITY ANGIOGRAPHY Left 04/04/2018   Procedure: LOWER EXTREMITY ANGIOGRAPHY;  Surgeon: [name], MD;  Location: [facility];  Service: Cardiovascular;  Laterality: Left;  . NM GATED MYOVIEW ([place] HX)  February 2017   Likely breast attenuation. LOW RISK study. Normal EF 55-60%.  . OTHER SURGICAL HISTORY     08/2016 or 09/2016 surgery on achilles tenso h/o staph infection heal. Dr. [name]  . removal of left hematoma Left    leg  . REVERSE SHOULDER ARTHROPLASTY Right 10/08/2015   Procedure: REVERSE SHOULDER ARTHROPLASTY;  Surgeon: [name], MD;  Location: [facility];  Service: Orthopedics;  Laterality: Right;  . SLEEVE GASTROPLASTY    . TONSILLECTOMY    . TOTAL KNEE ARTHROPLASTY    . TOTAL SHOULDER REPLACEMENT Right   . TRANSESOPHAGEAL ECHOCARDIOGRAM  08/08/2013   Mild LVH, EF 60-65%. Moderate LA dilation and mild RA dilation. Mild MR with no evidence of stenosis and no evidence of endocarditis. A  false chordae is noted.  . TRANSTHORACIC ECHOCARDIOGRAM  08/03/2013   Mild-Moderate concentric LVH, EF 60-65%. Normal diastolic function. Mild LA dilation. Mild MR with possible vegetation  - not confirmed on TEE   . ULNAR NERVE TRANSPOSITION Right 08/06/2016   Procedure: ULNAR NERVE DECOMPRESSION/TRANSPOSITION;  Surgeon: [name], MD;  Location: [facility] ORS;  Service: Orthopedics;  Laterality: Right;  . UPPER GI ENDOSCOPY    . VAGINAL HYSTERECTOMY      Home Medications:  Allergies as of 10/24/2018      Reactions   Penicillins Hives, Shortness Of Breath, Swelling, Other (See Comments)   Facial swelling Has patient had a PCN reaction causing immediate rash, facial/tongue/throat swelling, SOB or lightheadedness with hypotension: Yes Has patient had a PCN reaction causing severe rash involving  mucus membranes or skin necrosis: Yes Has patient had a PCN reaction that required hospitalization No Has patient had a PCN reaction occurring within the last 10 years: No If all of the above answers are "NO", then may proceed with Cephalosporin use.   Aspirin Hives   Morphine And Related Other (See Comments)   Patient becomes very confused   Terramycin [oxytetracycline] Hives      Medication List       Accurate as of October 24, 2018  1:55 PM. If you have any questions, ask your nurse or doctor.        albuterol (2.5 MG/3ML) 0.083% nebulizer solution Commonly known as: PROVENTIL Take 3 mLs (2.5 mg total) by nebulization every 6 (six) hours as needed for wheezing or shortness of breath. What changed: Another medication with the same name was changed. Make sure you understand how and when to take each.   albuterol 108 (90 Base) MCG/ACT inhaler Commonly known as: VENTOLIN HFA Inhale 1-2 puffs into the lungs every 4 (four) hours as needed for wheezing or shortness of breath. What changed: how much to take   amLODipine 5 MG tablet Commonly known as: NORVASC Take 1 tablet (5 mg total) by mouth  daily.   buPROPion 150 MG 24 hr tablet Commonly known as: WELLBUTRIN XL Take 1 tablet (150 mg total) by mouth daily.   CALCIUM + D PO Take 1 tablet by mouth 2 (two) times daily.   cephALEXin 500 MG capsule Commonly known as: Keflex Take 1 capsule (500 mg total) by mouth 2 times daily at 12 noon and 4 pm.   clobetasol cream 0.05 % Commonly known as: TEMOVATE Apply 1 application topically 2 (two) times daily. To arms What changed:   when to take this  reasons to take this   dabigatran 150 MG Caps capsule Commonly known as: Pradaxa Take 1 capsule (150 mg total) by mouth 2 (two) times daily.   Dexlansoprazole 30 MG capsule Commonly known as: Dexilant Take 1 capsule (30 mg total) by mouth daily. Note lower maintenance dose take 30 min before food lunch or dinner not with thyroid medication What changed: how much to take   doxycycline 100 MG capsule Commonly known as: VIBRAMYCIN As directed.   DULoxetine 60 MG capsule Commonly known as: CYMBALTA Take 1 capsule (60 mg total) by mouth daily.   estradiol 0.1 MG/GM vaginal cream Commonly known as: ESTRACE Apply 0.5mg  (pea-sized amount)  just inside the vaginal introitus with a finger-tip on Monday, Wednesday and Friday nights,   fesoterodine 8 MG Tb24 tablet Commonly known as: TOVIAZ Take 1 tablet (8 mg total) by mouth daily.   fluticasone-salmeterol 115-21 MCG/ACT inhaler Commonly known as: Advair HFA Inhale 2 puffs into the lungs daily. Rinse mouth thoroughly after use   hydrochlorothiazide 12.5 MG tablet Commonly known as: HYDRODIURIL Take 1 tablet (12.5 mg total) by mouth daily. In am   HYDROcodone-acetaminophen 5-325 MG tablet Commonly known as: NORCO/VICODIN Take 1 tablet by mouth every 8 (eight) hours as needed for up to 30 days for severe pain. Must last 30 days   HYDROcodone-acetaminophen 5-325 MG tablet Commonly known as: NORCO/VICODIN Take 1 tablet by mouth every 8 (eight) hours as needed for up to 30  days for severe pain. Must last 30 days   HYDROcodone-acetaminophen 5-325 MG tablet Commonly known as: NORCO/VICODIN Take 1 tablet by mouth every 8 (eight) hours as needed for up to 30 days for severe pain. Must last 30 days Start taking  on: November 05, 2018   levocetirizine 5 MG tablet Commonly known as: XYZAL Take 1 tablet (5 mg total) by mouth every evening.   levothyroxine 175 MCG tablet Commonly known as: SYNTHROID Take 1 tablet (175 mcg total) by mouth daily before breakfast.   losartan 50 MG tablet Commonly known as: COZAAR Take 1 tablet (50 mg total) by mouth daily. Do not take quinapril 40   meloxicam 15 MG tablet Commonly known as: MOBIC Take 1 tablet (15 mg total) by mouth daily as needed for pain.   metFORMIN 1000 MG tablet Commonly known as: GLUCOPHAGE Take 1 tablet (1,000 mg total) by mouth 2 (two) times daily. With food   montelukast 10 MG tablet Commonly known as: SINGULAIR Take 1 tablet (10 mg total) by mouth daily.   mupirocin ointment 2 % Commonly known as: Bactroban Apply 1 application topically 2 (two) times daily. Skin wounds   nystatin cream Commonly known as: MYCOSTATIN Apply 1 application topically 2 (two) times daily. Mouth prn for 7 days as needed What changed:   when to take this  reasons to take this   ondansetron 4 MG tablet Commonly known as: Zofran Take 1 tablet (4 mg total) by mouth every 8 (eight) hours as needed for nausea or vomiting.   oxybutynin 15 MG 24 hr tablet Commonly known as: DITROPAN XL TAKE ONE TABLET BY MOUTH AT BEDTIME   phenazopyridine 200 MG tablet Commonly known as: Pyridium Take 1 tablet (200 mg total) by mouth 3 (three) times daily as needed for pain.   pregabalin 25 MG capsule Commonly known as: Lyrica Take 1 capsule (25 mg total) by mouth 2 (two) times daily.   Voltaren 1 % Gel Generic drug: diclofenac sodium Apply 1 application topically daily as needed (pain).   zinc oxide 20 % ointment Apply 1  application topically as needed for irritation.       Allergies:  Allergies  Allergen Reactions  . Penicillins Hives, Shortness Of Breath, Swelling and Other (See Comments)    Facial swelling Has patient had a PCN reaction causing immediate rash, facial/tongue/throat swelling, SOB or lightheadedness with hypotension: Yes Has patient had a PCN reaction causing severe rash involving mucus membranes or skin necrosis: Yes Has patient had a PCN reaction that required hospitalization No Has patient had a PCN reaction occurring within the last 10 years: No If all of the above answers are "NO", then may proceed with Cephalosporin use.   . Aspirin Hives  . Morphine And Related Other (See Comments)    Patient becomes very confused  . Terramycin [Oxytetracycline] Hives    Family History: Family History  Problem Relation Age of Onset  . Skin cancer Father   . Diabetes Father   . Hypertension Father   . Peripheral vascular disease Father   . Cancer Father   . Cerebral aneurysm Father   . Alcohol abuse Father   . Varicose Veins Mother   . Kidney disease Mother   . Arthritis Mother   . Mental illness Sister   . Cancer Maternal Aunt        breast  . Breast cancer Maternal Aunt   . Arthritis Maternal Grandmother   . Hypertension Maternal Grandmother   . Diabetes Maternal Grandmother   . Arthritis Maternal Grandfather   . Heart disease Maternal Grandfather   . Hypertension Maternal Grandfather   . Arthritis Paternal Grandmother   . Hypertension Paternal Grandmother   . Diabetes Paternal Grandmother   . Arthritis Paternal  Grandfather   . Hypertension Paternal Grandfather   . Bladder Cancer Neg Hx   . Kidney cancer Neg Hx     Social History:  reports that she has never smoked. She has never used smokeless tobacco. She reports that she does not drink alcohol or use drugs.  ROS:                                        Physical Exam: There were no vitals  taken for this visit.  Constitutional:  Alert and oriented, No acute distress. \  Laboratory Data: Lab Results  Component Value Date   WBC 5.8 09/28/2018   HGB 10.8 (L) 09/28/2018   HCT 33.4 (L) 09/28/2018   MCV 89.3 09/28/2018   PLT 218 09/28/2018    Lab Results  Component Value Date   CREATININE 1.15 (H) 09/28/2018    No results found for: PSA  No results found for: TESTOSTERONE  Lab Results  Component Value Date   HGBA1C 6.6 (H) 07/29/2018    Urinalysis    Component Value Date/Time   COLORURINE YELLOW (A) 09/28/2018 1840   APPEARANCEUR HAZY (A) 09/28/2018 1840   APPEARANCEUR Cloudy (A) 07/26/2018 1457   LABSPEC 1.008 09/28/2018 1840   PHURINE 5.0 09/28/2018 1840   GLUCOSEU NEGATIVE 09/28/2018 1840   GLUCOSEU NEGATIVE 08/02/2017 1427   HGBUR MODERATE (A) 09/28/2018 [place] 09/28/2018 1840   BILIRUBINUR Negative 07/26/2018 1457   BILIRUBINUR 1+ 09/15/2017 1319   KETONESUR NEGATIVE 09/28/2018 1840   PROTEINUR NEGATIVE 09/28/2018 1840   UROBILINOGEN 0.2 09/15/2017 1319   UROBILINOGEN 0.2 08/02/2017 1427   NITRITE NEGATIVE 09/28/2018 1840   LEUKOCYTESUR SMALL (A) 09/28/2018 1840    Pertinent Imaging:   Assessment & Plan: Reassess on double therapy in 4 months  There are no diagnoses linked to this encounter.  No follow-ups on file.  Reece Packer, MD  [place] [address], [place], [place] [address] [phone]

## 2018-10-25 ENCOUNTER — Ambulatory Visit: Payer: PPO | Admitting: Anesthesiology

## 2018-10-25 ENCOUNTER — Ambulatory Visit
Admission: RE | Admit: 2018-10-25 | Discharge: 2018-10-25 | Disposition: A | Discharge status: Home / Self Care | Payer: PPO | Attending: Gastroenterology | Admitting: Gastroenterology

## 2018-10-25 ENCOUNTER — Encounter: Payer: Self-pay | Admitting: Certified Registered Nurse Anesthetist

## 2018-10-25 ENCOUNTER — Encounter
Admission: RE | Disposition: A | Discharge status: Home / Self Care | Payer: Self-pay | Source: Home / Self Care | Attending: Gastroenterology

## 2018-10-25 DIAGNOSIS — E1140 Type 2 diabetes mellitus with diabetic neuropathy, unspecified: Secondary | ICD-10-CM | POA: Insufficient documentation

## 2018-10-25 DIAGNOSIS — R1310 Dysphagia, unspecified: Secondary | ICD-10-CM | POA: Insufficient documentation

## 2018-10-25 DIAGNOSIS — D131 Benign neoplasm of stomach: Secondary | ICD-10-CM | POA: Insufficient documentation

## 2018-10-25 DIAGNOSIS — Z79899 Other long term (current) drug therapy: Secondary | ICD-10-CM | POA: Diagnosis not present

## 2018-10-25 DIAGNOSIS — K449 Diaphragmatic hernia without obstruction or gangrene: Secondary | ICD-10-CM | POA: Diagnosis not present

## 2018-10-25 DIAGNOSIS — K319 Disease of stomach and duodenum, unspecified: Secondary | ICD-10-CM | POA: Diagnosis not present

## 2018-10-25 DIAGNOSIS — Z86718 Personal history of other venous thrombosis and embolism: Secondary | ICD-10-CM | POA: Insufficient documentation

## 2018-10-25 DIAGNOSIS — Z7689 Persons encountering health services in other specified circumstances: Secondary | ICD-10-CM | POA: Diagnosis not present

## 2018-10-25 DIAGNOSIS — I10 Essential (primary) hypertension: Secondary | ICD-10-CM | POA: Diagnosis not present

## 2018-10-25 DIAGNOSIS — K228 Other specified diseases of esophagus: Secondary | ICD-10-CM | POA: Diagnosis not present

## 2018-10-25 DIAGNOSIS — Z96651 Presence of right artificial knee joint: Secondary | ICD-10-CM | POA: Insufficient documentation

## 2018-10-25 DIAGNOSIS — E1151 Type 2 diabetes mellitus with diabetic peripheral angiopathy without gangrene: Secondary | ICD-10-CM | POA: Diagnosis not present

## 2018-10-25 DIAGNOSIS — Z96611 Presence of right artificial shoulder joint: Secondary | ICD-10-CM | POA: Diagnosis not present

## 2018-10-25 DIAGNOSIS — G4733 Obstructive sleep apnea (adult) (pediatric): Secondary | ICD-10-CM | POA: Diagnosis not present

## 2018-10-25 DIAGNOSIS — K224 Dyskinesia of esophagus: Secondary | ICD-10-CM | POA: Diagnosis not present

## 2018-10-25 DIAGNOSIS — Z9884 Bariatric surgery status: Secondary | ICD-10-CM | POA: Insufficient documentation

## 2018-10-25 DIAGNOSIS — Z6841 Body Mass Index (BMI) 40.0 and over, adult: Secondary | ICD-10-CM | POA: Diagnosis not present

## 2018-10-25 DIAGNOSIS — G4730 Sleep apnea, unspecified: Secondary | ICD-10-CM | POA: Insufficient documentation

## 2018-10-25 DIAGNOSIS — E039 Hypothyroidism, unspecified: Secondary | ICD-10-CM | POA: Diagnosis not present

## 2018-10-25 DIAGNOSIS — K3189 Other diseases of stomach and duodenum: Secondary | ICD-10-CM | POA: Diagnosis not present

## 2018-10-25 DIAGNOSIS — K317 Polyp of stomach and duodenum: Secondary | ICD-10-CM | POA: Diagnosis not present

## 2018-10-25 HISTORY — PX: ESOPHAGOGASTRODUODENOSCOPY (EGD) WITH PROPOFOL: SHX5813

## 2018-10-25 SURGERY — ESOPHAGOGASTRODUODENOSCOPY (EGD) WITH PROPOFOL
Anesthesia: General

## 2018-10-25 MED ORDER — LIDOCAINE HCL (CARDIAC) PF 100 MG/5ML IV SOSY
PREFILLED_SYRINGE | INTRAVENOUS | Status: DC | PRN
  Administered 2018-10-25: 50 mg via INTRAVENOUS

## 2018-10-25 MED ORDER — PROPOFOL 500 MG/50ML IV EMUL
INTRAVENOUS | Status: DC | PRN
  Administered 2018-10-25: 130 ug/kg/min via INTRAVENOUS

## 2018-10-25 MED ORDER — PROPOFOL 500 MG/50ML IV EMUL
INTRAVENOUS | Status: AC
  Filled 2018-10-25: qty 50

## 2018-10-25 MED ORDER — SODIUM CHLORIDE 0.9 % IV SOLN
INTRAVENOUS | Status: DC
  Administered 2018-10-25: 1000 mL via INTRAVENOUS

## 2018-10-25 MED ORDER — PROPOFOL 10 MG/ML IV BOLUS
INTRAVENOUS | Status: DC | PRN
  Administered 2018-10-25: 20 mg via INTRAVENOUS
  Administered 2018-10-25: 80 mg via INTRAVENOUS

## 2018-10-25 MED ORDER — LIDOCAINE HCL (PF) 2 % IJ SOLN
INTRAMUSCULAR | Status: AC
  Filled 2018-10-25: qty 10

## 2018-10-25 NOTE — Anesthesia Preprocedure Evaluation (Addendum)
Anesthesia Evaluation  Patient identified by MRN, date of birth, ID band Patient awake    Reviewed: Allergy & Precautions, H&P , NPO status , Patient's Chart, lab work & pertinent test results  History of Anesthesia Complications (+) history of anesthetic complications ("slow to wake up")  Airway Mallampati: II  TM Distance: >3 FB     Dental  (+) Chipped, Missing   Pulmonary asthma , sleep apnea , neg recent URI,           Cardiovascular hypertension, (-) angina+ Peripheral Vascular Disease  (-) Past MI, (-) Cardiac Stents and (-) CABG + dysrhythmias Atrial Fibrillation      Neuro/Psych PSYCHIATRIC DISORDERS Anxiety Depression Neuropathy, radiculitis, chronic pain    GI/Hepatic Neg liver ROS, GERD  Controlled,  Endo/Other  diabetesHypothyroidism Morbid obesity  Renal/GU negative Renal ROS  negative genitourinary   Musculoskeletal   Abdominal   Peds  Hematology  (+) Blood dyscrasia, anemia ,   Anesthesia Other Findings Past Medical History: No date: Abnormal antibody titer No date: Anxiety and depression No date: Arthritis No date: Asthma No date: Cystocele No date: Depression No date: Diabetes ([place]) No date: DVT (deep venous thrombosis) (HCC)     Comment:  Righ calf No date: Dysrhythmia No date: Edema No date: GERD (gastroesophageal reflux disease) No date: Heart murmur     Comment:  Echocardiogram June 2015: Mild MR (possible vegetation               seen on TTE, not seen on TEE), normal LV size with               moderate concentric LVH. Normal function EF 60-65%.               Normal diastolic function. Mild LA dilation. No date: History of IBS 2008: History of methicillin resistant staphylococcus aureus (MRSA) No date: Hyperlipidemia No date: Hypertension No date: Hypothyroidism No date: Insomnia No date: Kidney stone     Comment:  kidney stones with lithotripsy No date: Neuropathy involving both  lower extremities No date: Obesity, Class II, BMI 35-39.9, with comorbidity 2007: Paroxysmal atrial fibrillation ([place])     Comment:  In June 2015, Cardiac Event Monitor: Mostly SR/sinus               arrhythmia with PVCs that are frequent. Short bursts of               A. fib lasting several minutes;; CHA2DS2-VASc Score = 5               (age, Female, PVD, DM, HTN) No date: Peripheral vascular disease ([place]) No date: Sleep apnea     Comment:  use C-PAP No date: Urinary incontinence No date: Venous stasis dermatitis of both lower extremities  Past Surgical History: No date: ANKLE SURGERY; Right 3/0/8657: APPLICATION OF WOUND VAC; Left     Comment:  Procedure: APPLICATION OF WOUND VAC ( POSSIBLE ) ;                Surgeon: [name], MD;  Location: [facility];  Service:               Vascular;  Laterality: Left; 10/24/6960: APPLICATION OF WOUND VAC; Left     Comment:  Procedure: APPLICATION OF WOUND VAC;  Surgeon: [name], DPM;  Location: [facility];  Service: Podiatry;  Laterality: Left; No date: arm surgery     Comment:  right   No date: CHOLECYSTECTOMY No date: COLONOSCOPY 01/11/2017: COLONOSCOPY WITH PROPOFOL; N/A     Comment:  Procedure: COLONOSCOPY WITH PROPOFOL;  Surgeon:               [name], MD;  Location: [facility] END[facility]COPY;                Service: Endoscopy;  Laterality: N/A; 02/22/2017: COLONOSCOPY WITH PROPOFOL; N/A     Comment:  Procedure: COLONOSCOPY WITH PROPOFOL;  Surgeon:               [name], MD;  Location: [facility] ENDOSCOPY;                Service: Endoscopy;  Laterality: N/A; 05/31/2017: COLONOSCOPY WITH PROPOFOL; N/A     Comment:  Procedure: COLONOSCOPY WITH PROPOFOL;  Surgeon:               [name], MD;  Location: [facility] ENDOSCOPY;                Service: Endoscopy;  Laterality: N/A; 01/11/2017: ESOPHAGOGASTRODUODENOSCOPY (EGD) WITH PROPOFOL; N/A     Comment:  Procedure: ESOPHAGOGASTRODUODENOSCOPY  (EGD) WITH               PROPOFOL;  Surgeon: [name], MD;  Location:               [facility] ENDOSCOPY;  Service: Endoscopy;  Laterality: N/A; No date: HERNIA REPAIR     Comment:  umbilical No date: HIATAL HERNIA REPAIR 06/27/2015: I&D EXTREMITY; Left     Comment:  Procedure: IRRIGATION AND DEBRIDEMENT EXTREMITY                       ( CALF HEMATOMA ) POSSIBLE WOUND VAC;  Surgeon: [name], MD;  Location: [facility];  Service: Vascular;                Laterality: Left; 09/25/2016: INCISION AND DRAINAGE OF WOUND; Left     Comment:  Procedure: IRRIGATION AND DEBRIDEMENT - PARTIAL               RESECTION OF ACHILLES TENDON WITH WOUND VAC APPLICATION;               Surgeon: [name], DPM;  Location: [facility];                Service: Podiatry;  Laterality: Left; 09/07/2016: IRRIGATION AND DEBRIDEMENT ABSCESS; Left     Comment:  Procedure: IRRIGATION AND DEBRIDEMENT ABSCESS LEFT HEEL;              Surgeon: [name], DPM;  Location: [facility];                Service: Podiatry;  Laterality: Left; 2013: JOINT REPLACEMENT     Comment:  left knee replacement No date: KIDNEY SURGERY; Right     Comment:  kidney stones No date: LITHOTRIPSY 04/04/2018: LOWER EXTREMITY ANGIOGRAPHY; Left     Comment:  Procedure: LOWER EXTREMITY ANGIOGRAPHY;  Surgeon: [name], MD;  Location: [facility];  Service:               Cardiovascular;  Laterality: Left; February 2017: NM GATED  MYOVIEW ([place] HX)     Comment:  Likely breast attenuation. LOW RISK study. Normal EF               55-60%. No date: OTHER SURGICAL HISTORY     Comment:  08/2016 or 09/2016 surgery on achilles tenso h/o staph               infection heal. Dr. [name] No date: removal of left hematoma; Left     Comment:  leg 10/08/2015: REVERSE SHOULDER ARTHROPLASTY; Right     Comment:  Procedure: REVERSE SHOULDER ARTHROPLASTY;  Surgeon: [name], MD;  Location: [facility];   Service: Orthopedics;               Laterality: Right; No date: SLEEVE GASTROPLASTY No date: TONSILLECTOMY No date: TOTAL KNEE ARTHROPLASTY No date: TOTAL SHOULDER REPLACEMENT; Right 08/08/2013: TRANSESOPHAGEAL ECHOCARDIOGRAM     Comment:  Mild LVH, EF 60-65%. Moderate LA dilation and mild RA               dilation. Mild MR with no evidence of stenosis and no               evidence of endocarditis. A false chordae is noted. 08/03/2013: TRANSTHORACIC ECHOCARDIOGRAM     Comment:  Mild-Moderate concentric LVH, EF 60-65%. Normal               diastolic function. Mild LA dilation. Mild MR with               possible vegetation  - not confirmed on TEE  08/06/2016: ULNAR NERVE TRANSPOSITION; Right     Comment:  Procedure: ULNAR NERVE DECOMPRESSION/TRANSPOSITION;                Surgeon: [name], MD;  Location: [facility];                Service: Orthopedics;  Laterality: Right; No date: UPPER GI ENDOSCOPY No date: VAGINAL HYSTERECTOMY     Reproductive/Obstetrics negative OB ROS                            Anesthesia Physical Anesthesia Plan  ASA: III  Anesthesia Plan: General   Post-op Pain Management:    Induction:   PONV Risk Score and Plan: Propofol infusion and TIVA  Airway Management Planned: Natural Airway and Nasal Cannula  Additional Equipment:   Intra-op Plan:   Post-operative Plan:   Informed Consent: I have reviewed the patients History and Physical, chart, labs and discussed the procedure including the risks, benefits and alternatives for the proposed anesthesia with the patient or authorized representative who has indicated his/her understanding and acceptance.     Dental Advisory Given  Plan Discussed with: Anesthesiologist and CRNA  Anesthesia Plan Comments:         Anesthesia Quick Evaluation

## 2018-10-25 NOTE — Anesthesia Post-op Follow-up Note (Signed)
Anesthesia QCDR form completed.        

## 2018-10-25 NOTE — Op Note (Signed)
[facility] Gastroenterology Patient Name: [name] Procedure Date: 10/25/2018 10:37 AM MRN: [id number] Account #: [id number] Date of Birth: [birth year] Admit Type: Outpatient Age: 68 Room: [facility] ENDO ROOM 3 Gender: Female Note Status: Finalized Procedure:            Upper GI endoscopy Indications:          Dysphagia Providers:            [name], MD Referring MD:         [name] [name] MD, MD (Referring MD) Medicines:            Monitored Anesthesia Care Complications:        No immediate complications. Procedure:            Pre-Anesthesia Assessment:                       - ASA Grade Assessment: III - A patient with severe                        systemic disease.                       After obtaining informed consent, the endoscope was                        passed under direct vision. Throughout the procedure,                        the patient's blood pressure, pulse, and oxygen                        saturations were monitored continuously. The Endoscope                        was introduced through the mouth, and advanced to the                        third part of duodenum. The upper GI endoscopy was                        accomplished without difficulty. The patient tolerated                        the procedure well. Findings:      Abnormal motility was noted in the middle and lower third of the       esophagus. The cricopharyngeus was normal. There are extra peristaltic       waves in the esophageal body. The distal esophagus/lower esophageal       sphincter is open. Tertiary peristaltic waves are noted.      The Z-line was variable. Biopsies were taken with a cold forceps for       histology.      Diffuse and patchy mild inflammation characterized by congestion       (edema), erythema and friability was found in the gastric antrum.       Biopsies were taken with a cold forceps for histology.      Evidence of a sleeve  gastrectomy was found in the greater curvature of       the stomach. This was characterized by healthy appearing mucosa. Some  sutures/staples are noted below the mucosa without inflammation or       erosion.      A single 3 mm sessile polyp with no bleeding and no stigmata of recent       bleeding was found in the gastric body. The polyp was removed with a       cold biopsy forceps. Resection and retrieval were complete.      A small hiatal hernia was present.      There is no evidence of stenosis or narrowing throughout the esophagus.       Of note however, the oropharynx and upper esophagus is very dry with       areas of dried mucus. Impression:           - Abnormal esophageal motility, consistent with                        presbyesophagus.                       - Z-line variable. Biopsied.                       - Gastritis. Biopsied.                       - A sleeve gastrectomy was found, characterized by                        healthy appearing mucosa.                       - A single gastric polyp. Resected and retrieved.                       - Small hiatal hernia. Recommendation:       - Continue present medications.                       - Use sucralfate suspension 1 gram PO BID.                       - Return to GI clinic in 4 weeks.                       - Check ANA, if positive check for Sjogrens syndrome.                        today. Procedure Code(s):    --- Professional ---                       [phone], Esophagogastroduodenoscopy, flexible, transoral;                        with biopsy, single or multiple Diagnosis Code(s):    --- Professional ---                       K22.4, Dyskinesia of esophagus                       K22.8, Other specified diseases of esophagus                       K29.70,  Gastritis, unspecified, without bleeding                       Z98.84, Bariatric surgery status                       K31.7, Polyp of stomach and duodenum                        K44.9, Diaphragmatic hernia without obstruction or                        gangrene                       R13.10, Dysphagia, unspecified CPT copyright 2019 [employer] Medical Association. All rights reserved. The codes documented in this report are preliminary and upon coder review may  be revised to meet current compliance requirements. [name], MD 10/25/2018 11:53:45 AM This report has been signed electronically. Number of Addenda: 0 Note Initiated On: 10/25/2018 10:37 AM      [facility]

## 2018-10-25 NOTE — Transfer of Care (Signed)
Immediate Anesthesia Transfer of Care Note  Patient: [name]  Procedure(s) Performed: ESOPHAGOGASTRODUODENOSCOPY (EGD) WITH PROPOFOL (N/A )  Patient Location: PACU and Endoscopy Unit  Anesthesia Type:General  Level of Consciousness: drowsy  Airway & Oxygen Therapy: Patient Spontanous Breathing and Patient connected to nasal cannula oxygen  Post-op Assessment: Report given to RN and Post -op Vital signs reviewed and stable  Post vital signs: Reviewed and stable  Last Vitals:  Vitals Value Taken Time  BP 182/92 10/25/18 1144  Temp    Pulse 66 10/25/18 1145  Resp 30 10/25/18 1145  SpO2 100 % 10/25/18 1145  Vitals shown include unvalidated device data.  Last Pain:  Vitals:   10/25/18 1144  TempSrc: (P) Tympanic  PainSc:          Complications: No apparent anesthesia complications

## 2018-10-25 NOTE — H&P (Signed)
Outpatient short stay form Pre-procedure 10/25/2018 11:18 AM [name] MD  Primary Physician: Dr. Olivia Mackie [name]  Reason for visit: EGD  History of present illness: Patient is a 68 year old female presenting today for an EGD in regards complaint of dysphagia.  This is been a chronic issue for her.  She does have a barium swallow that was done on 08/19/2018.  Exam was somewhat limited however was found to have mild tertiary esophageal contractions consistent with presbyesophagus.  The cervical and thoracic esophagus were widely patent.  There was a small hiatal hernia.  Mild reflux noted.  A standard tablet passed without difficulty.  She does take Pradaxa has been off of that agent for over 3 days.  Takes Dexilant.  I discussed the results of her barium swallow with her.  Of note patient also has a history of a gastric sleeve.    Current Facility-Administered Medications:  .  0.9 %  sodium chloride infusion, , Intravenous, Continuous, [name], MD, Last Rate: 20 mL/hr at 10/25/18 1104, 1,000 mL at 10/25/18 1104  Facility-Administered Medications Prior to Admission  Medication Dose Route Frequency Provider Last Rate Last Dose  . ipratropium-albuterol (DUONEB) 0.5-2.5 (3) MG/3ML nebulizer solution 3 mL  3 mL Nebulization Q6H [name], [name], MD       Medications Prior to Admission  Medication Sig Dispense Refill Last Dose  . albuterol (PROVENTIL HFA;VENTOLIN HFA) 108 (90 Base) MCG/ACT inhaler Inhale 1-2 puffs into the lungs every 4 (four) hours as needed for wheezing or shortness of breath. (Patient taking differently: Inhale 2 puffs into the lungs every 4 (four) hours as needed for wheezing or shortness of breath. ) 8 g 2 10/24/2018 at Unknown time  . albuterol (PROVENTIL) (2.5 MG/3ML) 0.083% nebulizer solution Take 3 mLs (2.5 mg total) by nebulization every 6 (six) hours as needed for wheezing or shortness of breath. 150 mL 1 10/24/2018 at Unknown time  .  amLODipine (NORVASC) 5 MG tablet Take 1 tablet (5 mg total) by mouth daily. 90 tablet 3 10/25/2018 at 0800  . buPROPion (WELLBUTRIN XL) 150 MG 24 hr tablet Take 1 tablet (150 mg total) by mouth daily. 90 tablet 3 10/24/2018 at Unknown time  . Calcium Citrate-Vitamin D (CALCIUM + D PO) Take 1 tablet by mouth 2 (two) times daily.   Past Week at Unknown time  . cephALEXin (KEFLEX) 500 MG capsule Take 1 capsule (500 mg total) by mouth 2 times daily at 12 noon and 4 pm. 76 capsule 0 10/25/2018 at 0800  . clobetasol cream (TEMOVATE) 8.75 % Apply 1 application topically 2 (two) times daily. To arms (Patient taking differently: Apply 1 application topically 2 (two) times daily as needed (irritation). To arms) 60 g 1 Past Week at Unknown time  . dabigatran (PRADAXA) 150 MG CAPS capsule Take 1 capsule (150 mg total) by mouth 2 (two) times daily. 180 capsule 1 Past Week at Unknown time  . Dexlansoprazole (DEXILANT) 30 MG capsule Take 1 capsule (30 mg total) by mouth daily. Note lower maintenance dose take 30 min before food lunch or dinner not with thyroid medication (Patient taking differently: Take 60 mg by mouth daily. Note lower maintenance dose take 30 min before food lunch or dinner not with thyroid medication) 90 capsule 3 Past Week at Unknown time  . diclofenac sodium (VOLTAREN) 1 % GEL Apply 1 application topically daily as needed (pain).    Past Week at Unknown time  . doxycycline (VIBRAMYCIN) 100 MG capsule As  directed.   Past Week at Unknown time  . estradiol (ESTRACE) 0.1 MG/GM vaginal cream Apply 0.5mg  (pea-sized amount)  just inside the vaginal introitus with a finger-tip on Monday, Wednesday and Friday nights, 42.5 g 11 Past Week at Unknown time  . fesoterodine (TOVIAZ) 8 MG TB24 tablet Take 1 tablet (8 mg total) by mouth daily. 90 tablet 3 Past Week at Unknown time  . fluticasone-salmeterol (ADVAIR HFA) 115-21 MCG/ACT inhaler Inhale 2 puffs into the lungs daily. Rinse mouth thoroughly after use 1 Inhaler  12 Past Week at Unknown time  . hydrochlorothiazide (HYDRODIURIL) 12.5 MG tablet Take 1 tablet (12.5 mg total) by mouth daily. In am 90 tablet 3 10/25/2018 at 0800  . HYDROcodone-acetaminophen (NORCO/VICODIN) 5-325 MG tablet Take 1 tablet by mouth every 8 (eight) hours as needed for up to 30 days for severe pain. Must last 30 days 90 tablet 0 Past Week at Unknown time  . [START ON 11/05/2018] HYDROcodone-acetaminophen (NORCO/VICODIN) 5-325 MG tablet Take 1 tablet by mouth every 8 (eight) hours as needed for up to 30 days for severe pain. Must last 30 days 90 tablet 0 Past Week at Unknown time  . levocetirizine (XYZAL) 5 MG tablet Take 1 tablet (5 mg total) by mouth every evening. 90 tablet 3 Past Week at Unknown time  . levothyroxine (SYNTHROID, LEVOTHROID) 175 MCG tablet Take 1 tablet (175 mcg total) by mouth daily before breakfast. 90 tablet 3 Past Week at Unknown time  . losartan (COZAAR) 50 MG tablet Take 1 tablet (50 mg total) by mouth daily. Do not take quinapril 40 90 tablet 3 10/25/2018 at 0800  . meloxicam (MOBIC) 15 MG tablet Take 1 tablet (15 mg total) by mouth daily as needed for pain. 30 tablet 2 Past Week at Unknown time  . metFORMIN (GLUCOPHAGE) 1000 MG tablet Take 1 tablet (1,000 mg total) by mouth 2 (two) times daily. With food 180 tablet 3 Past Week at Unknown time  . montelukast (SINGULAIR) 10 MG tablet Take 1 tablet (10 mg total) by mouth daily. 90 tablet 3 Past Week at Unknown time  . mupirocin ointment (BACTROBAN) 2 % Apply 1 application topically 2 (two) times daily. Skin wounds 30 g 0 Past Week at Unknown time  . nystatin cream (MYCOSTATIN) Apply 1 application topically 2 (two) times daily. Mouth prn for 7 days as needed (Patient taking differently: Apply 1 application topically 2 (two) times daily as needed for dry skin. Mouth prn for 7 days as needed) 30 g 11 Past Week at Unknown time  . ondansetron (ZOFRAN) 4 MG tablet Take 1 tablet (4 mg total) by mouth every 8 (eight) hours as  needed for nausea or vomiting. 30 tablet 0 Past Week at Unknown time  . phenazopyridine (PYRIDIUM) 200 MG tablet Take 1 tablet (200 mg total) by mouth 3 (three) times daily as needed for pain. 20 tablet 0 Past Week at Unknown time  . pregabalin (LYRICA) 25 MG capsule Take 1 capsule (25 mg total) by mouth 2 (two) times daily. 60 capsule 2 Past Week at Unknown time  . zinc oxide 20 % ointment Apply 1 application topically as needed for irritation. 56.7 g 0 Past Week at Unknown time  . DULoxetine (CYMBALTA) 60 MG capsule Take 1 capsule (60 mg total) by mouth daily. (Patient not taking: Reported on 10/25/2018) 90 capsule 0 Not Taking at Unknown time  . HYDROcodone-acetaminophen (NORCO/VICODIN) 5-325 MG tablet Take 1 tablet by mouth every 8 (eight) hours as needed for  up to 30 days for severe pain. Must last 30 days 90 tablet 0   . oxybutynin (DITROPAN XL) 15 MG 24 hr tablet TAKE ONE TABLET BY MOUTH AT BEDTIME 90 tablet 3      Allergies  Allergen Reactions  . Penicillins Hives, Shortness Of Breath, Swelling and Other (See Comments)    Facial swelling Has patient had a PCN reaction causing immediate rash, facial/tongue/throat swelling, SOB or lightheadedness with hypotension: Yes Has patient had a PCN reaction causing severe rash involving mucus membranes or skin necrosis: Yes Has patient had a PCN reaction that required hospitalization No Has patient had a PCN reaction occurring within the last 10 years: No If all of the above answers are "NO", then may proceed with Cephalosporin use.   . Aspirin Hives  . Morphine And Related Other (See Comments)    Patient becomes very confused  . Terramycin [Oxytetracycline] Hives     Past Medical History:  Diagnosis Date  . Abnormal antibody titer   . Anxiety and depression   . Arthritis   . Asthma   . Cystocele   . Depression   . Diabetes ([place])   . DVT (deep venous thrombosis) ([facility])    Righ calf  . Dysrhythmia   . Edema   . GERD (gastroesophageal  reflux disease)   . Heart murmur    Echocardiogram June 2015: Mild MR (possible vegetation seen on TTE, not seen on TEE), normal LV size with moderate concentric LVH. Normal function EF 60-65%. Normal diastolic function. Mild LA dilation.  Marland Kitchen History of IBS   . History of methicillin resistant staphylococcus aureus (MRSA) 2008  . Hyperlipidemia   . Hypertension   . Hypothyroidism   . Insomnia   . Kidney stone    kidney stones with lithotripsy  . Neuropathy involving both lower extremities   . Obesity, Class II, BMI 35-39.9, with comorbidity   . Paroxysmal atrial fibrillation ([place]) 2007   In June 2015, Cardiac Event Monitor: Mostly SR/sinus arrhythmia with PVCs that are frequent. Short bursts of A. fib lasting several minutes;; CHA2DS2-VASc Score = 5 (age, Female, PVD, DM, HTN)  . Peripheral vascular disease ([place])   . Sleep apnea    use C-PAP  . Urinary incontinence   . Venous stasis dermatitis of both lower extremities     Review of systems:      Physical Exam    Heart and lungs: Regular rate and rhythm without rub or gallop lungs are clear    HEENT: Normocephalic atraumatic eyes are anicteric    Other:    Pertinant exam for procedure: Soft nontender nondistended bowel sounds positive normoactive.  There is some minimal discomfort to palpation in the right upper quadrant no masses or rebound.    Planned proceedures: EGD and indicated procedures. I have discussed the risks benefits and complications of procedures to include not limited to bleeding, infection, perforation and the risk of sedation and the patient wishes to proceed.    [name], MD Gastroenterology 10/25/2018  11:18 AM

## 2018-10-25 NOTE — Anesthesia Postprocedure Evaluation (Signed)
Anesthesia Post Note  Patient: [name]  Procedure(s) Performed: ESOPHAGOGASTRODUODENOSCOPY (EGD) WITH PROPOFOL (N/A )  Patient location during evaluation: PACU Anesthesia Type: General Level of consciousness: awake and alert Pain management: pain level controlled Vital Signs Assessment: post-procedure vital signs reviewed and stable Respiratory status: spontaneous breathing, nonlabored ventilation and respiratory function stable Cardiovascular status: blood pressure returned to baseline and stable Postop Assessment: no apparent nausea or vomiting Anesthetic complications: no     Last Vitals:  Vitals:   10/25/18 1154 10/25/18 1204  BP: (!) 153/80 (!) 166/91  Pulse: 63 65  Resp: 20 15  Temp:    SpO2: 100% 97%    Last Pain:  Vitals:   10/25/18 1204  TempSrc:   PainSc: 0-No pain                 Durenda Hurt

## 2018-10-26 ENCOUNTER — Encounter: Payer: Self-pay | Admitting: Gastroenterology

## 2018-10-27 ENCOUNTER — Other Ambulatory Visit: Payer: Self-pay

## 2018-10-27 LAB — SURGICAL PATHOLOGY

## 2018-10-28 LAB — CULTURE, URINE COMPREHENSIVE

## 2018-10-31 DIAGNOSIS — R1310 Dysphagia, unspecified: Secondary | ICD-10-CM | POA: Diagnosis not present

## 2018-11-01 ENCOUNTER — Encounter: Payer: Self-pay | Admitting: Plastic Surgery

## 2018-11-01 ENCOUNTER — Ambulatory Visit (INDEPENDENT_AMBULATORY_CARE_PROVIDER_SITE_OTHER): Payer: PPO | Admitting: Plastic Surgery

## 2018-11-01 ENCOUNTER — Other Ambulatory Visit: Payer: Self-pay

## 2018-11-01 DIAGNOSIS — E1142 Type 2 diabetes mellitus with diabetic polyneuropathy: Secondary | ICD-10-CM | POA: Diagnosis not present

## 2018-11-01 DIAGNOSIS — L97522 Non-pressure chronic ulcer of other part of left foot with fat layer exposed: Secondary | ICD-10-CM | POA: Diagnosis not present

## 2018-11-01 DIAGNOSIS — Z6841 Body Mass Index (BMI) 40.0 and over, adult: Secondary | ICD-10-CM

## 2018-11-01 NOTE — Progress Notes (Deleted)
No diagnosis found.   History of Present Illness: CHARRISSE [name] is a 68 y.o.  female  with a history of left lower extremity ulcers. She presents for preoperative evaluation for upcoming procedure, Excision of left foot wound with Acell placement, scheduled for 11/09/18 with Dr. Marla Roe. Patient has a significant past medical history; including Type 2 DM, paroxsymal atrial fibrillation, heart murmur, hypertension, peripheral vascular disease, asthma, sleep apnea, anxiety, depression, venous stasis dermatitis, IBS, GERD, neuropathy, and kidney stones. Patient has a history of DVT in the right calf in ***. Patient takes multiple medications, as listed below.     Past Medical History: Allergies: Allergies  Allergen Reactions  . Penicillins Hives, Shortness Of Breath, Swelling and Other (See Comments)    Facial swelling Has patient had a PCN reaction causing immediate rash, facial/tongue/throat swelling, SOB or lightheadedness with hypotension: Yes Has patient had a PCN reaction causing severe rash involving mucus membranes or skin necrosis: Yes Has patient had a PCN reaction that required hospitalization No Has patient had a PCN reaction occurring within the last 10 years: No If all of the above answers are "NO", then may proceed with Cephalosporin use.   . Aspirin Hives  . Morphine And Related Other (See Comments)    Patient becomes very confused  . Terramycin [Oxytetracycline] Hives    Current Medications:  Current Outpatient Medications:  .  albuterol (PROVENTIL HFA;VENTOLIN HFA) 108 (90 Base) MCG/ACT inhaler, Inhale 1-2 puffs into the lungs every 4 (four) hours as needed for wheezing or shortness of breath. (Patient taking differently: Inhale 2 puffs into the lungs every 4 (four) hours as needed for wheezing or shortness of breath. ), Disp: 8 g, Rfl: 2 .  albuterol (PROVENTIL) (2.5 MG/3ML) 0.083% nebulizer solution, Take 3 mLs (2.5 mg total) by nebulization every 6 (six) hours as  needed for wheezing or shortness of breath., Disp: 150 mL, Rfl: 1 .  amLODipine (NORVASC) 5 MG tablet, Take 1 tablet (5 mg total) by mouth daily., Disp: 90 tablet, Rfl: 3 .  buPROPion (WELLBUTRIN XL) 150 MG 24 hr tablet, Take 1 tablet (150 mg total) by mouth daily., Disp: 90 tablet, Rfl: 3 .  Calcium Citrate-Vitamin D (CALCIUM + D PO), Take 1 tablet by mouth 2 (two) times daily., Disp: , Rfl:  .  cephALEXin (KEFLEX) 500 MG capsule, Take 1 capsule (500 mg total) by mouth 2 times daily at 12 noon and 4 pm., Disp: 76 capsule, Rfl: 0 .  clobetasol cream (TEMOVATE) 1.30 %, Apply 1 application topically 2 (two) times daily. To arms (Patient taking differently: Apply 1 application topically 2 (two) times daily as needed (irritation). To arms), Disp: 60 g, Rfl: 1 .  dabigatran (PRADAXA) 150 MG CAPS capsule, Take 1 capsule (150 mg total) by mouth 2 (two) times daily., Disp: 180 capsule, Rfl: 1 .  Dexlansoprazole (DEXILANT) 30 MG capsule, Take 1 capsule (30 mg total) by mouth daily. Note lower maintenance dose take 30 min before food lunch or dinner not with thyroid medication (Patient taking differently: Take 60 mg by mouth daily. Note lower maintenance dose take 30 min before food lunch or dinner not with thyroid medication), Disp: 90 capsule, Rfl: 3 .  diclofenac sodium (VOLTAREN) 1 % GEL, Apply 1 application topically daily as needed (pain). , Disp: , Rfl:  .  doxycycline (VIBRAMYCIN) 100 MG capsule, As directed., Disp: , Rfl:  .  DULoxetine (CYMBALTA) 60 MG capsule, Take 1 capsule (60 mg total) by mouth daily. (Patient not  taking: Reported on 10/25/2018), Disp: 90 capsule, Rfl: 0 .  estradiol (ESTRACE) 0.1 MG/GM vaginal cream, Apply 0.5mg  (pea-sized amount)  just inside the vaginal introitus with a finger-tip on Monday, Wednesday and Friday nights,, Disp: 42.5 g, Rfl: 11 .  fesoterodine (TOVIAZ) 8 MG TB24 tablet, Take 1 tablet (8 mg total) by mouth daily., Disp: 90 tablet, Rfl: 3 .  fluticasone-salmeterol  (ADVAIR HFA) 115-21 MCG/ACT inhaler, Inhale 2 puffs into the lungs daily. Rinse mouth thoroughly after use, Disp: 1 Inhaler, Rfl: 12 .  hydrochlorothiazide (HYDRODIURIL) 12.5 MG tablet, Take 1 tablet (12.5 mg total) by mouth daily. In am, Disp: 90 tablet, Rfl: 3 .  HYDROcodone-acetaminophen (NORCO/VICODIN) 5-325 MG tablet, Take 1 tablet by mouth every 8 (eight) hours as needed for up to 30 days for severe pain. Must last 30 days, Disp: 90 tablet, Rfl: 0 .  HYDROcodone-acetaminophen (NORCO/VICODIN) 5-325 MG tablet, Take 1 tablet by mouth every 8 (eight) hours as needed for up to 30 days for severe pain. Must last 30 days, Disp: 90 tablet, Rfl: 0 .  [START ON 11/05/2018] HYDROcodone-acetaminophen (NORCO/VICODIN) 5-325 MG tablet, Take 1 tablet by mouth every 8 (eight) hours as needed for up to 30 days for severe pain. Must last 30 days, Disp: 90 tablet, Rfl: 0 .  levocetirizine (XYZAL) 5 MG tablet, Take 1 tablet (5 mg total) by mouth every evening., Disp: 90 tablet, Rfl: 3 .  levothyroxine (SYNTHROID, LEVOTHROID) 175 MCG tablet, Take 1 tablet (175 mcg total) by mouth daily before breakfast., Disp: 90 tablet, Rfl: 3 .  losartan (COZAAR) 50 MG tablet, Take 1 tablet (50 mg total) by mouth daily. Do not take quinapril 40, Disp: 90 tablet, Rfl: 3 .  meloxicam (MOBIC) 15 MG tablet, Take 1 tablet (15 mg total) by mouth daily as needed for pain., Disp: 30 tablet, Rfl: 2 .  metFORMIN (GLUCOPHAGE) 1000 MG tablet, Take 1 tablet (1,000 mg total) by mouth 2 (two) times daily. With food, Disp: 180 tablet, Rfl: 3 .  montelukast (SINGULAIR) 10 MG tablet, Take 1 tablet (10 mg total) by mouth daily., Disp: 90 tablet, Rfl: 3 .  mupirocin ointment (BACTROBAN) 2 %, Apply 1 application topically 2 (two) times daily. Skin wounds, Disp: 30 g, Rfl: 0 .  nystatin cream (MYCOSTATIN), Apply 1 application topically 2 (two) times daily. Mouth prn for 7 days as needed (Patient taking differently: Apply 1 application topically 2 (two)  times daily as needed for dry skin. Mouth prn for 7 days as needed), Disp: 30 g, Rfl: 11 .  ondansetron (ZOFRAN) 4 MG tablet, Take 1 tablet (4 mg total) by mouth every 8 (eight) hours as needed for nausea or vomiting., Disp: 30 tablet, Rfl: 0 .  oxybutynin (DITROPAN XL) 15 MG 24 hr tablet, TAKE ONE TABLET BY MOUTH AT BEDTIME, Disp: 90 tablet, Rfl: 3 .  phenazopyridine (PYRIDIUM) 200 MG tablet, Take 1 tablet (200 mg total) by mouth 3 (three) times daily as needed for pain., Disp: 20 tablet, Rfl: 0 .  pregabalin (LYRICA) 25 MG capsule, Take 1 capsule (25 mg total) by mouth 2 (two) times daily., Disp: 60 capsule, Rfl: 2 .  zinc oxide 20 % ointment, Apply 1 application topically as needed for irritation., Disp: 56.7 g, Rfl: 0  Current Facility-Administered Medications:  .  ipratropium-albuterol (DUONEB) 0.5-2.5 (3) MG/3ML nebulizer solution 3 mL, 3 mL, Nebulization, Q6H, [name], [name], MD  Past Medical Problems: Past Medical History:  Diagnosis Date  . Abnormal antibody titer   .  Anxiety and depression   . Arthritis   . Asthma   . Cystocele   . Depression   . Diabetes ([place])   . DVT (deep venous thrombosis) (HCC)    Righ calf  . Dysrhythmia   . Edema   . GERD (gastroesophageal reflux disease)   . Heart murmur    Echocardiogram June 2015: Mild MR (possible vegetation seen on TTE, not seen on TEE), normal LV size with moderate concentric LVH. Normal function EF 60-65%. Normal diastolic function. Mild LA dilation.  Marland Kitchen History of IBS   . History of methicillin resistant staphylococcus aureus (MRSA) 2008  . Hyperlipidemia   . Hypertension   . Hypothyroidism   . Insomnia   . Kidney stone    kidney stones with lithotripsy  . Neuropathy involving both lower extremities   . Obesity, Class II, BMI 35-39.9, with comorbidity   . Paroxysmal atrial fibrillation ([place]) 2007   In June 2015, Cardiac Event Monitor: Mostly SR/sinus arrhythmia with PVCs that are frequent. Short bursts of A. fib  lasting several minutes;; CHA2DS2-VASc Score = 5 (age, Female, PVD, DM, HTN)  . Peripheral vascular disease ([place])   . Sleep apnea    use C-PAP  . Urinary incontinence   . Venous stasis dermatitis of both lower extremities     Past Surgical History: Past Surgical History:  Procedure Laterality Date  . ANKLE SURGERY Right   . APPLICATION OF WOUND VAC Left 06/27/2015   Procedure: APPLICATION OF WOUND VAC ( POSSIBLE ) ;  Surgeon: [name], MD;  Location: [facility] ORS;  Service: Vascular;  Laterality: Left;  . APPLICATION OF WOUND VAC Left 09/25/2016   Procedure: APPLICATION OF WOUND VAC;  Surgeon: [name], DPM;  Location: [facility] ORS;  Service: Podiatry;  Laterality: Left;  . arm surgery     right    . CHOLECYSTECTOMY    . COLONOSCOPY    . COLONOSCOPY WITH PROPOFOL N/A 01/11/2017   Procedure: COLONOSCOPY WITH PROPOFOL;  Surgeon: [name], MD;  Location: [facility] ENDOSCOPY;  Service: Endoscopy;  Laterality: N/A;  . COLONOSCOPY WITH PROPOFOL N/A 02/22/2017   Procedure: COLONOSCOPY WITH PROPOFOL;  Surgeon: [name], MD;  Location: [facility] ENDOSCOPY;  Service: Endoscopy;  Laterality: N/A;  . COLONOSCOPY WITH PROPOFOL N/A 05/31/2017   Procedure: COLONOSCOPY WITH PROPOFOL;  Surgeon: [name], MD;  Location: [facility] ENDOSCOPY;  Service: Endoscopy;  Laterality: N/A;  . ESOPHAGOGASTRODUODENOSCOPY (EGD) WITH PROPOFOL N/A 01/11/2017   Procedure: ESOPHAGOGASTRODUODENOSCOPY (EGD) WITH PROPOFOL;  Surgeon: [name], MD;  Location: [facility] ENDOSCOPY;  Service: Endoscopy;  Laterality: N/A;  . ESOPHAGOGASTRODUODENOSCOPY (EGD) WITH PROPOFOL N/A 10/25/2018   Procedure: ESOPHAGOGASTRODUODENOSCOPY (EGD) WITH PROPOFOL;  Surgeon: [name], MD;  Location: [facility] ENDOSCOPY;  Service: Endoscopy;  Laterality: N/A;  . HERNIA REPAIR     umbilical  . HIATAL HERNIA REPAIR    . I&D EXTREMITY Left 06/27/2015   Procedure: IRRIGATION AND DEBRIDEMENT EXTREMITY            ( CALF HEMATOMA )  POSSIBLE WOUND VAC;  Surgeon: [name], MD;  Location: [facility] ORS;  Service: Vascular;  Laterality: Left;  . INCISION AND DRAINAGE OF WOUND Left 09/25/2016   Procedure: IRRIGATION AND DEBRIDEMENT - PARTIAL RESECTION OF ACHILLES TENDON WITH WOUND VAC APPLICATION;  Surgeon: [name], DPM;  Location: [facility] ORS;  Service: Podiatry;  Laterality: Left;  . IRRIGATION AND DEBRIDEMENT ABSCESS Left 09/07/2016   Procedure: IRRIGATION AND DEBRIDEMENT ABSCESS LEFT HEEL;  Surgeon:  [name], DPM;  Location: [facility] ORS;  Service: Podiatry;  Laterality: Left;  . JOINT REPLACEMENT  2013   left knee replacement  . KIDNEY SURGERY Right    kidney stones  . LITHOTRIPSY    . LOWER EXTREMITY ANGIOGRAPHY Left 04/04/2018   Procedure: LOWER EXTREMITY ANGIOGRAPHY;  Surgeon: [name], MD;  Location: [facility];  Service: Cardiovascular;  Laterality: Left;  . NM GATED MYOVIEW ([place] HX)  February 2017   Likely breast attenuation. LOW RISK study. Normal EF 55-60%.  . OTHER SURGICAL HISTORY     08/2016 or 09/2016 surgery on achilles tenso h/o staph infection heal. Dr. [name]  . removal of left hematoma Left    leg  . REVERSE SHOULDER ARTHROPLASTY Right 10/08/2015   Procedure: REVERSE SHOULDER ARTHROPLASTY;  Surgeon: [name], MD;  Location: [facility] ORS;  Service: Orthopedics;  Laterality: Right;  . SLEEVE GASTROPLASTY    . TONSILLECTOMY    . TOTAL KNEE ARTHROPLASTY    . TOTAL SHOULDER REPLACEMENT Right   . TRANSESOPHAGEAL ECHOCARDIOGRAM  08/08/2013   Mild LVH, EF 60-65%. Moderate LA dilation and mild RA dilation. Mild MR with no evidence of stenosis and no evidence of endocarditis. A false chordae is noted.  . TRANSTHORACIC ECHOCARDIOGRAM  08/03/2013   Mild-Moderate concentric LVH, EF 60-65%. Normal diastolic function. Mild LA dilation. Mild MR with possible vegetation  - not confirmed on TEE   . ULNAR NERVE TRANSPOSITION Right 08/06/2016   Procedure: ULNAR NERVE DECOMPRESSION/TRANSPOSITION;   Surgeon: [name], MD;  Location: [facility] ORS;  Service: Orthopedics;  Laterality: Right;  . UPPER GI ENDOSCOPY    . VAGINAL HYSTERECTOMY      The patient {HAS HAS PJA:25053} had anesthesia or sedation in the past.   The patient {HAS HAS ZJQ:73419} had problems with anesthesia. *** The patient {DOES / DOES NOT:22038} have a family history of anesthesia problems.  ***  Social History: Social History   Socioeconomic History  . Marital status: Married    Spouse name: Not on file  . Number of children: Not on file  . Years of education: Not on file  . Highest education level: Not on file  Occupational History  . Not on file  Social Needs  . Financial resource strain: Not on file  . Food insecurity    Worry: Not on file    Inability: Not on file  . Transportation needs    Medical: Not on file    Non-medical: Not on file  Tobacco Use  . Smoking status: Never Smoker  . Smokeless tobacco: Never Used  Substance and Sexual Activity  . Alcohol use: No    Alcohol/week: 0.0 standard drinks  . Drug use: No  . Sexual activity: Yes  Lifestyle  . Physical activity    Days per week: Not on file    Minutes per session: Not on file  . Stress: Not on file  Relationships  . Social Herbalist on phone: Not on file    Gets together: Not on file    Attends religious service: Not on file    Active member of club or organization: Not on file    Attends meetings of clubs or organizations: Not on file    Relationship status: Not on file  . Intimate partner violence    Fear of current or ex partner: Not on file    Emotionally abused: Not on file    Physically abused: Not on file  Forced sexual activity: Not on file  Other Topics Concern  . Not on file  Social History Narrative   She is currently married -- for 30+ years. Does not work. Does not smoke or take alcohol. She never smoked. She exercises at least 3 days a week since before her gastric surgery.   Marital status  reviewed in history of present illness.   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 Skin cancer Father   . Diabetes Father   . Hypertension Father   . Peripheral vascular disease Father   . Cancer Father   . Cerebral aneurysm Father   . Alcohol abuse Father   . Varicose Veins Mother   . Kidney disease Mother   . Arthritis Mother   . Mental illness Sister   . Cancer Maternal Aunt        breast  . Breast cancer Maternal Aunt   . Arthritis Maternal Grandmother   . Hypertension Maternal Grandmother   . Diabetes Maternal Grandmother   . Arthritis Maternal Grandfather   . Heart disease Maternal Grandfather   . Hypertension Maternal Grandfather   . Arthritis Paternal Grandmother   . Hypertension Paternal Grandmother   . Diabetes Paternal Grandmother   . Arthritis Paternal Grandfather   . Hypertension Paternal Grandfather   . Bladder Cancer Neg Hx   . Kidney cancer Neg Hx     Review of Systems: General ROS: {rosgen:310653} Dermatological ROS: {ros skin:310673} Cardiovascular ROS: {roscv:310661} ENT ROS: {rosent:310657} Gastrointestinal ROS: {ros gi:310669}  Physical Exam: Vital Signs There were no vitals taken for this visit. General:{Exam; general:16600} HEENT: teeth intact, dentures, missing teeth Neck: supple, full ROM Chest: symmetrical rise and fall Breast: {Exam; breast:13139::"normal appearance, no masses or tenderness"} Cardiac: regular rate and rhythm Abdomen: soft, non-distended GU: {exam; gu:16315} Musculoskeletal: MAE x4 Neuro: {Exam; neuro:16375} Extremities: {Exam; extremities:15096} Skin: no skin lesions or abnormalities  Assessment: Gale Klar is a 68 yo female with a left lower extremity ulcers and multiple co-morbidities. Caprini score=**. Patient has personal history of DVT in right calf.   Plan:  Patient is scheduled  for Excision of left foot wound with Acell placement on 11/09/18, with Dr. Marla Roe. Discussed ***. Risks, benefits, and alternatives of procedure discussed, and questions answered.   The risks that can be encountered with and after include; bleeding, infection, delayed healing, anesthesia risks, skin sensation changes, injury to structures including nerves, blood vessels, and muscles which may be temporary or permanent, allergies to tape, suture materials and glues, blood products, topical preparations or injected agents, skin contour irregularities, skin discoloration and swelling, deep vein thrombosis, cardiac and pulmonary complications, pain, which may persist, failure of the graft and possible need for revisional surgery or staged procedures.   Electronically signed by: Alfredo Batty, NP 11/01/2018 12:20 PM

## 2018-11-02 ENCOUNTER — Telehealth: Payer: Self-pay | Admitting: *Deleted

## 2018-11-02 DIAGNOSIS — E1143 Type 2 diabetes mellitus with diabetic autonomic (poly)neuropathy: Secondary | ICD-10-CM | POA: Diagnosis not present

## 2018-11-02 DIAGNOSIS — L97521 Non-pressure chronic ulcer of other part of left foot limited to breakdown of skin: Secondary | ICD-10-CM | POA: Diagnosis not present

## 2018-11-02 MED ORDER — CIPROFLOXACIN HCL 250 MG PO TABS: 250.0000 mg | ORAL_TABLET | Freq: Two times a day (BID) | ORAL | 0 refills | Status: DC

## 2018-11-02 NOTE — Telephone Encounter (Addendum)
Left patient a message-RX sent in to [facility]  ----- Message from Bjorn Loser, MD sent at 10/31/2018  3:03 PM EDT ----- Cipro 250 mg twice a day for 1 week          ----- Message ----- From: Shanon Ace, CMA Sent: 10/27/2018   8:22 AM EDT To: Bjorn Loser, MD   ----- Message ----- From: Lavone Neri Lab Results In Sent: 10/24/2018   4:36 PM EDT To: Rowe [facility] Clinical

## 2018-11-03 ENCOUNTER — Telehealth: Payer: Self-pay | Admitting: Internal Medicine

## 2018-11-03 ENCOUNTER — Ambulatory Visit: Payer: PPO | Admitting: Infectious Diseases

## 2018-11-03 ENCOUNTER — Other Ambulatory Visit: Payer: Self-pay

## 2018-11-03 NOTE — Progress Notes (Signed)
   Subjective:    Patient ID: [name], female    DOB: Nov 21, 1950, 68 y.o.   MRN: [id number]  The patient is a 68 yrs old wf joining me by televisit for discussion about her foot wound.  She has multiple medical conditions including obstructive sleep apnea, atrial fibrillation and spinal stenosis.  She is 250 pounds and 5 feet 6 inches tall.  Her biggest issue right now is a right medial lower leg wound that is 2 cm in size.  Both legs have severe hemosiderosis and a woody texture with chronic peripheral vascular disease.  We are planning on debridement.   Review of Systems  Constitutional: Positive for activity change. Negative for appetite change.  Eyes: Negative.   Cardiovascular: Positive for leg swelling.  Endocrine: Negative.   Genitourinary: Negative.   Musculoskeletal: Negative.   Psychiatric/Behavioral: Negative.        Objective:   Physical Exam virtual visit     Assessment & Plan:     ICD-10-CM   1. Morbid obesity with BMI of 40.0-44.9, adult ([place])  E66.01    Z68.41   2. Skin ulcer of left foot with fat layer exposed ([place])  L97.522   3. Diabetic peripheral neuropathy associated with type 2 diabetes mellitus (HCC)  E11.42     The patient gave consent to have this visit done by telemedicine / virtual visit.  This is also consent for access the chart and treat the patient via this visit. The patient is located at home.  I, the provider, am at the office.  We spent 5 minutes together for the visit.   Plan for debridement of right leg wound with Acell placement.

## 2018-11-03 NOTE — H&P (View-Only) (Signed)
   Subjective:    Patient ID: [name], female    DOB: 01-25-1951, 68 y.o.   MRN: [id number]  The patient is a 68 yrs old wf joining me by televisit for discussion about her foot wound.  She has multiple medical conditions including obstructive sleep apnea, atrial fibrillation and spinal stenosis.  She is 250 pounds and 5 feet 6 inches tall.  Her biggest issue right now is a right medial lower leg wound that is 2 cm in size.  Both legs have severe hemosiderosis and a woody texture with chronic peripheral vascular disease.  We are planning on debridement.   Review of Systems  Constitutional: Positive for activity change. Negative for appetite change.  Eyes: Negative.   Cardiovascular: Positive for leg swelling.  Endocrine: Negative.   Genitourinary: Negative.   Musculoskeletal: Negative.   Psychiatric/Behavioral: Negative.        Objective:   Physical Exam virtual visit     Assessment & Plan:     ICD-10-CM   1. Morbid obesity with BMI of 40.0-44.9, adult ([place])  E66.01    Z68.41   2. Skin ulcer of left foot with fat layer exposed ([place])  L97.522   3. Diabetic peripheral neuropathy associated with type 2 diabetes mellitus (HCC)  E11.42     The patient gave consent to have this visit done by telemedicine / virtual visit.  This is also consent for access the chart and treat the patient via this visit. The patient is located at home.  I, the provider, am at the office.  We spent 5 minutes together for the visit.   Plan for debridement of right leg wound with Acell placement.

## 2018-11-03 NOTE — Telephone Encounter (Signed)
Can you call Ms Paternostro back? She called and was asking why we called in Cipro for her.

## 2018-11-03 NOTE — Telephone Encounter (Signed)
Pt needs to be seen in office before 11/09/2018. She is having surgery that day. There

## 2018-11-03 NOTE — Telephone Encounter (Signed)
Make in person

## 2018-11-03 NOTE — Telephone Encounter (Signed)
Patient informed based on Urine culture results-Dr. Matilde Sprang advised to take, patient verbalized understanding.

## 2018-11-05 ENCOUNTER — Other Ambulatory Visit (HOSPITAL_COMMUNITY)
Admission: RE | Admit: 2018-11-05 | Discharge: 2018-11-05 | Disposition: A | Discharge status: Home / Self Care | Payer: PPO | Source: Ambulatory Visit | Attending: Plastic Surgery | Admitting: Plastic Surgery

## 2018-11-05 DIAGNOSIS — Z20828 Contact with and (suspected) exposure to other viral communicable diseases: Secondary | ICD-10-CM | POA: Diagnosis not present

## 2018-11-05 DIAGNOSIS — Z01812 Encounter for preprocedural laboratory examination: Secondary | ICD-10-CM | POA: Insufficient documentation

## 2018-11-05 LAB — SARS CORONAVIRUS 2 (TAT 6-24 HRS): SARS Coronavirus 2: NEGATIVE

## 2018-11-07 ENCOUNTER — Telehealth: Payer: Self-pay | Admitting: Internal Medicine

## 2018-11-07 DIAGNOSIS — I4891 Unspecified atrial fibrillation: Secondary | ICD-10-CM | POA: Diagnosis not present

## 2018-11-07 DIAGNOSIS — E039 Hypothyroidism, unspecified: Secondary | ICD-10-CM | POA: Diagnosis not present

## 2018-11-07 DIAGNOSIS — K219 Gastro-esophageal reflux disease without esophagitis: Secondary | ICD-10-CM | POA: Diagnosis not present

## 2018-11-07 DIAGNOSIS — Z87440 Personal history of urinary (tract) infections: Secondary | ICD-10-CM | POA: Diagnosis not present

## 2018-11-07 DIAGNOSIS — I10 Essential (primary) hypertension: Secondary | ICD-10-CM | POA: Diagnosis not present

## 2018-11-07 DIAGNOSIS — Z7901 Long term (current) use of anticoagulants: Secondary | ICD-10-CM | POA: Diagnosis not present

## 2018-11-07 DIAGNOSIS — I872 Venous insufficiency (chronic) (peripheral): Secondary | ICD-10-CM | POA: Diagnosis not present

## 2018-11-07 DIAGNOSIS — R32 Unspecified urinary incontinence: Secondary | ICD-10-CM | POA: Diagnosis not present

## 2018-11-07 DIAGNOSIS — J449 Chronic obstructive pulmonary disease, unspecified: Secondary | ICD-10-CM | POA: Diagnosis not present

## 2018-11-07 DIAGNOSIS — Z96659 Presence of unspecified artificial knee joint: Secondary | ICD-10-CM | POA: Diagnosis not present

## 2018-11-07 DIAGNOSIS — E1143 Type 2 diabetes mellitus with diabetic autonomic (poly)neuropathy: Secondary | ICD-10-CM | POA: Diagnosis not present

## 2018-11-07 DIAGNOSIS — Z7951 Long term (current) use of inhaled steroids: Secondary | ICD-10-CM | POA: Diagnosis not present

## 2018-11-07 DIAGNOSIS — Z7984 Long term (current) use of oral hypoglycemic drugs: Secondary | ICD-10-CM | POA: Diagnosis not present

## 2018-11-07 DIAGNOSIS — E11621 Type 2 diabetes mellitus with foot ulcer: Secondary | ICD-10-CM | POA: Diagnosis not present

## 2018-11-07 DIAGNOSIS — F329 Major depressive disorder, single episode, unspecified: Secondary | ICD-10-CM | POA: Diagnosis not present

## 2018-11-07 DIAGNOSIS — M6281 Muscle weakness (generalized): Secondary | ICD-10-CM | POA: Diagnosis not present

## 2018-11-07 DIAGNOSIS — D649 Anemia, unspecified: Secondary | ICD-10-CM | POA: Diagnosis not present

## 2018-11-07 DIAGNOSIS — E1151 Type 2 diabetes mellitus with diabetic peripheral angiopathy without gangrene: Secondary | ICD-10-CM | POA: Diagnosis not present

## 2018-11-07 DIAGNOSIS — Z96611 Presence of right artificial shoulder joint: Secondary | ICD-10-CM | POA: Diagnosis not present

## 2018-11-07 DIAGNOSIS — E785 Hyperlipidemia, unspecified: Secondary | ICD-10-CM | POA: Diagnosis not present

## 2018-11-07 DIAGNOSIS — L97322 Non-pressure chronic ulcer of left ankle with fat layer exposed: Secondary | ICD-10-CM | POA: Diagnosis not present

## 2018-11-07 DIAGNOSIS — Z86718 Personal history of other venous thrombosis and embolism: Secondary | ICD-10-CM | POA: Diagnosis not present

## 2018-11-07 DIAGNOSIS — L97411 Non-pressure chronic ulcer of right heel and midfoot limited to breakdown of skin: Secondary | ICD-10-CM | POA: Diagnosis not present

## 2018-11-07 DIAGNOSIS — L97421 Non-pressure chronic ulcer of left heel and midfoot limited to breakdown of skin: Secondary | ICD-10-CM | POA: Diagnosis not present

## 2018-11-07 NOTE — Telephone Encounter (Signed)
Also for pre-op before surgery more impt than PCP  1) her cardiologist has to clear her and  2. Her lung doctor   She needs appts with these doctors too before surgery  Inform she needs to make appts with these providers asap  When is her surgery scheduled?    [place]

## 2018-11-07 NOTE — Telephone Encounter (Signed)
Left message for patient to return call to office. P[facility] nurse may advise.

## 2018-11-08 ENCOUNTER — Encounter: Payer: Self-pay | Admitting: Internal Medicine

## 2018-11-08 ENCOUNTER — Encounter (HOSPITAL_COMMUNITY): Payer: Self-pay | Admitting: *Deleted

## 2018-11-08 ENCOUNTER — Other Ambulatory Visit: Payer: Self-pay

## 2018-11-08 ENCOUNTER — Ambulatory Visit (INDEPENDENT_AMBULATORY_CARE_PROVIDER_SITE_OTHER): Payer: PPO | Admitting: Internal Medicine

## 2018-11-08 VITALS — BP 150/78 | HR 64 | Temp 97.3°F

## 2018-11-08 DIAGNOSIS — E1151 Type 2 diabetes mellitus with diabetic peripheral angiopathy without gangrene: Secondary | ICD-10-CM

## 2018-11-08 DIAGNOSIS — L97521 Non-pressure chronic ulcer of other part of left foot limited to breakdown of skin: Secondary | ICD-10-CM | POA: Diagnosis not present

## 2018-11-08 DIAGNOSIS — I10 Essential (primary) hypertension: Secondary | ICD-10-CM | POA: Diagnosis not present

## 2018-11-08 DIAGNOSIS — Z6841 Body Mass Index (BMI) 40.0 and over, adult: Secondary | ICD-10-CM

## 2018-11-08 DIAGNOSIS — E1143 Type 2 diabetes mellitus with diabetic autonomic (poly)neuropathy: Secondary | ICD-10-CM | POA: Diagnosis not present

## 2018-11-08 DIAGNOSIS — L97522 Non-pressure chronic ulcer of other part of left foot with fat layer exposed: Secondary | ICD-10-CM

## 2018-11-08 DIAGNOSIS — F329 Major depressive disorder, single episode, unspecified: Secondary | ICD-10-CM

## 2018-11-08 DIAGNOSIS — L97911 Non-pressure chronic ulcer of unspecified part of right lower leg limited to breakdown of skin: Secondary | ICD-10-CM | POA: Diagnosis not present

## 2018-11-08 DIAGNOSIS — F419 Anxiety disorder, unspecified: Secondary | ICD-10-CM

## 2018-11-08 NOTE — Progress Notes (Signed)
Mrs. [name] denies chest pain or shortness of breath. [name] test was negative 11/05/2018 and she has been in quarantine except to go to Dr appointments. [name] has Type Ii diabetes, I instructed patient to not take Metformin in am. I instructed patient to check CBG after awaking and every 2 hours until arrival  to the hospital.  I Instructed patient if CBG is less than 70 to drink 1/2 cup of a clear juice. Recheck CBG in 15 minutes then call pre- op desk at [phone] for further instructions.

## 2018-11-08 NOTE — Anesthesia Preprocedure Evaluation (Addendum)
Anesthesia Evaluation  Patient identified by MRN, date of birth, ID band Patient awake    Reviewed: Allergy & Precautions, NPO status , Patient's Chart, lab work & pertinent test results, reviewed documented beta blocker date and time   Airway Mallampati: II  TM Distance: >3 FB Neck ROM: Full    Dental  (+) Poor Dentition, Chipped   Pulmonary shortness of breath and with exertion, asthma , sleep apnea and Continuous Positive Airway Pressure Ventilation ,    Pulmonary exam normal breath sounds clear to auscultation       Cardiovascular hypertension, Pt. on medications and Pt. on home beta blockers + Peripheral Vascular Disease  Normal cardiovascular exam+ dysrhythmias Atrial Fibrillation + Valvular Problems/Murmurs  Rhythm:Regular Rate:Normal     Neuro/Psych PSYCHIATRIC DISORDERS Anxiety Depression Peripheral neuropathy  Neuromuscular disease    GI/Hepatic Neg liver ROS, GERD  Medicated and Controlled,  Endo/Other  diabetes, Well Controlled, Type 2, Oral Hypoglycemic AgentsHypothyroidism Morbid obesity  Renal/GU Renal diseaseHx/o renal calculi Bladder dysfunction  SUI    Musculoskeletal  (+) Arthritis , Osteoarthritis,  Fibromyalgia -Open wound left foot Hx/o impingement left shoulder Thoracic spinal stenosis   Abdominal (+) + obese,   Peds  Hematology  (+) anemia , Pradaxa therapy- last dose   Anesthesia Other Findings   Reproductive/Obstetrics                            Anesthesia Physical Anesthesia Plan  ASA: III  Anesthesia Plan: General   Post-op Pain Management:    Induction: Intravenous  PONV Risk Score and Plan: 4 or greater and Ondansetron and Treatment may vary due to age or medical condition  Airway Management Planned: LMA  Additional Equipment:   Intra-op Plan:   Post-operative Plan: Extubation in OR  Informed Consent: I have reviewed the patients History and  Physical, chart, labs and discussed the procedure including the risks, benefits and alternatives for the proposed anesthesia with the patient or authorized representative who has indicated his/her understanding and acceptance.     Dental advisory given  Plan Discussed with: CRNA and Surgeon  Anesthesia Plan Comments: (Follows with cardiology for paroxysmal afib. Had episodes of atypical chest pain in Jan 2020. Had nuclear stress 03/28/18 which was low risk with no ischemia.   Cleared by PCP 11/08/18: "preop clearance approved for surgery 11/09/2018 left heel chronic wound. revised cardiac risk score 6 % today score of 1 for h/o stroke in 2006 . No chest pain . Breathing normally except mild wheezing left lung field.EKG no change 03/2018 and 07/2018 w/o chest pain in NSR today and off pradaxa for Afib since 11/03/18 to resume 24 hrs after surgery plastics "  Nuclear stress 03/28/18:  There was no ST segment deviation noted during stress.  No T wave inversion was noted during stress.  The study is normal.  This is a low risk study.  The left ventricular ejection fraction is hyperdynamic (>65%).  Sinus bradycardia at baseline (47 bpm) )      Anesthesia Quick Evaluation

## 2018-11-08 NOTE — Patient Instructions (Signed)
Living With Anxiety  After being diagnosed with an anxiety disorder, you may be relieved to know why you have felt or behaved a certain way. It is natural to also feel overwhelmed about the treatment ahead and what it will mean for your life. With care and support, you can manage this condition and recover from it. How to cope with anxiety Dealing with stress Stress is your body's reaction to life changes and events, both good and bad. Stress can last just a few hours or it can be ongoing. Stress can play a major role in anxiety, so it is important to learn both how to cope with stress and how to think about it differently. Talk with your health care provider or a counselor to learn more about stress reduction. He or she may suggest some stress reduction techniques, such as:  Music therapy. This can include creating or listening to music that you enjoy and that inspires you.  Mindfulness-based meditation. This involves being aware of your normal breaths, rather than trying to control your breathing. It can be done while sitting or walking.  Centering prayer. This is a kind of meditation that involves focusing on a word, phrase, or sacred image that is meaningful to you and that brings you peace.  Deep breathing. To do this, expand your stomach and inhale slowly through your nose. Hold your breath for 3-5 seconds. Then exhale slowly, allowing your stomach muscles to relax.  Self-talk. This is a skill where you identify thought patterns that lead to anxiety reactions and correct those thoughts.  Muscle relaxation. This involves tensing muscles then relaxing them. Choose a stress reduction technique that fits your lifestyle and personality. Stress reduction techniques take time and practice. Set aside 5-15 minutes a day to do them. Therapists can offer training in these techniques. The training may be covered by some insurance plans. Other things you can do to manage stress include:  Keeping a  stress diary. This can help you learn what triggers your stress and ways to control your response.  Thinking about how you respond to certain situations. You may not be able to control everything, but you can control your reaction.  Making time for activities that help you relax, and not feeling guilty about spending your time in this way. Therapy combined with coping and stress-reduction skills provides the best chance for successful treatment. Medicines Medicines can help ease symptoms. Medicines for anxiety include:  Anti-anxiety drugs.  Antidepressants.  Beta-blockers. Medicines may be used as the main treatment for anxiety disorder, along with therapy, or if other treatments are not working. Medicines should be prescribed by a health care provider. Relationships Relationships can play a big part in helping you recover. Try to spend more time connecting with trusted friends and family members. Consider going to couples counseling, taking family education classes, or going to family therapy. Therapy can help you and others better understand the condition. How to recognize changes in your condition Everyone has a different response to treatment for anxiety. Recovery from anxiety happens when symptoms decrease and stop interfering with your daily activities at home or work. This may mean that you will start to:  Have better concentration and focus.  Sleep better.  Be less irritable.  Have more energy.  Have improved memory. It is important to recognize when your condition is getting worse. Contact your health care provider if your symptoms interfere with home or work and you do not feel like your condition is improving. Where   to find help and support: You can get help and support from these sources:  Self-help groups.  Online and community organizations.  A trusted spiritual leader.  Couples counseling.  Family education classes.  Family therapy. Follow these instructions  at home:  Eat a healthy diet that includes plenty of vegetables, fruits, whole grains, low-fat dairy products, and lean protein. Do not eat a lot of foods that are high in solid fats, added sugars, or salt.  Exercise. Most adults should do the following: ? Exercise for at least 150 minutes each week. The exercise should increase your heart rate and make you sweat (moderate-intensity exercise). ? Strengthening exercises at least twice a week.  Cut down on caffeine, tobacco, alcohol, and other potentially harmful substances.  Get the right amount and quality of sleep. Most adults need 7-9 hours of sleep each night.  Make choices that simplify your life.  Take over-the-counter and prescription medicines only as told by your health care provider.  Avoid caffeine, alcohol, and certain over-the-counter cold medicines. These may make you feel worse. Ask your pharmacist which medicines to avoid.  Keep all follow-up visits as told by your health care provider. This is important. Questions to ask your health care provider  Would I benefit from therapy?  How often should I follow up with a health care provider?  How long do I need to take medicine?  Are there any long-term side effects of my medicine?  Are there any alternatives to taking medicine? Contact a health care provider if:  You have a hard time staying focused or finishing daily tasks.  You spend many hours a day feeling worried about everyday life.  You become exhausted by worry.  You start to have headaches, feel tense, or have nausea.  You urinate more than normal.  You have diarrhea. Get help right away if:  You have a racing heart and shortness of breath.  You have thoughts of hurting yourself or others. If you ever feel like you may hurt yourself or others, or have thoughts about taking your own life, get help right away. You can go to your nearest emergency department or call:  Your local emergency services  (911 in the [place]  A suicide crisis helpline, such as the National Suicide Prevention Lifeline at 1-800-273-8255. This is open 24-hours a day. Summary  Taking steps to deal with stress can help calm you.  Medicines cannot cure anxiety disorders, but they can help ease symptoms.  Family, friends, and partners can play a big part in helping you recover from an anxiety disorder. This information is not intended to replace advice given to you by your health care provider. Make sure you discuss any questions you have with your health care provider. Document Released: 03/03/2016 Document Revised: 02/19/2017 Document Reviewed: 03/03/2016 [employer] Patient Education  2020 [employer]  

## 2018-11-08 NOTE — Telephone Encounter (Signed)
Patient seeing PCP today.

## 2018-11-08 NOTE — Progress Notes (Addendum)
Chief Complaint  Patient presents with  . Pre-op Exam  . Anxiety   Pre-op  1. Debridement and surgery on left heel for chronic wound x 2 years with Dr. Lyndee Leo [facility] 11/09/18 and needs pre op clearance  Reviewed EKG5/7/20 and 03/24/18 no new changes with sinus bradycardia, sinus arrthymia and 1st degree AV block  Pt denies chest pain today last Cr 1.15  A1C 6.6 07/2018  She reports she stopped pradaxa 11/03/18 She does have h/o lacunar infarcts noted on prior MRI in 2006  2. HTN BP 158/72 repeat 150/78 on norvasc 5 mg qd hctz 12.5 mg qd, losartan 50 mg qd, toprol xl 50 mg qd BP last night per pt was 128/72  3. Anxiety increased due to life stressors with her family on xanax 0.5 mg bid prn cymbalta 60 mg qd will consider therapy in future for now will check with insurance    Review of Systems  Constitutional: Negative for weight loss.  HENT: Negative for hearing loss.   Eyes: Negative for blurred vision.  Respiratory: Negative for shortness of breath.   Cardiovascular: Negative for chest pain.  Gastrointestinal: Negative for abdominal pain.  Skin: Negative for rash.  Neurological: Negative for headaches.  Psychiatric/Behavioral: Negative for depression.   Past Medical History:  Diagnosis Date  . Abnormal antibody titer   . Anxiety and depression   . Arthritis   . Asthma   . Cystocele   . Depression   . Diabetes ([place])   . DVT (deep venous thrombosis) (HCC)    Righ calf  . Dysrhythmia   . Edema   . GERD (gastroesophageal reflux disease)   . Heart murmur    Echocardiogram June 2015: Mild MR (possible vegetation seen on TTE, not seen on TEE), normal LV size with moderate concentric LVH. Normal function EF 60-65%. Normal diastolic function. Mild LA dilation.  Marland Kitchen History of IBS   . History of methicillin resistant staphylococcus aureus (MRSA) 2008  . Hyperlipidemia   . Hypertension   . Hypothyroidism   . Insomnia   . Kidney stone    kidney stones with lithotripsy   . Neuropathy involving both lower extremities   . Obesity, Class II, BMI 35-39.9, with comorbidity   . Paroxysmal atrial fibrillation ([place]) 2007   In June 2015, Cardiac Event Monitor: Mostly SR/sinus arrhythmia with PVCs that are frequent. Short bursts of A. fib lasting several minutes;; CHA2DS2-VASc Score = 5 (age, Female, PVD, DM, HTN)  . Peripheral vascular disease ([place])   . Sleep apnea    use C-PAP  . Urinary incontinence   . Venous stasis dermatitis of both lower extremities    Past Surgical History:  Procedure Laterality Date  . ANKLE SURGERY Right   . APPLICATION OF WOUND VAC Left 06/27/2015   Procedure: APPLICATION OF WOUND VAC ( POSSIBLE ) ;  Surgeon: [name], MD;  Location: [facility] ORS;  Service: Vascular;  Laterality: Left;  . APPLICATION OF WOUND VAC Left 09/25/2016   Procedure: APPLICATION OF WOUND VAC;  Surgeon: [name], DPM;  Location: [facility];  Service: Podiatry;  Laterality: Left;  . arm surgery     right    . CHOLECYSTECTOMY    . COLONOSCOPY    . COLONOSCOPY WITH PROPOFOL N/A 01/11/2017   Procedure: COLONOSCOPY WITH PROPOFOL;  Surgeon: [name], MD;  Location: [facility] ENDOSCOPY;  Service: Endoscopy;  Laterality: N/A;  . COLONOSCOPY WITH PROPOFOL N/A 02/22/2017   Procedure: COLONOSCOPY WITH PROPOFOL;  Surgeon:  [name], MD;  Location: [facility] ENDOSCOPY;  Service: Endoscopy;  Laterality: N/A;  . COLONOSCOPY WITH PROPOFOL N/A 05/31/2017   Procedure: COLONOSCOPY WITH PROPOFOL;  Surgeon: [name], MD;  Location: [facility] ENDOSCOPY;  Service: Endoscopy;  Laterality: N/A;  . ESOPHAGOGASTRODUODENOSCOPY (EGD) WITH PROPOFOL N/A 01/11/2017   Procedure: ESOPHAGOGASTRODUODENOSCOPY (EGD) WITH PROPOFOL;  Surgeon: [name], MD;  Location[facility]COPY;  Service: Endoscopy;  Laterality: N/A;  . ESOPHAGOGASTRODUODENOSCOPY (EGD) WITH PROPOFOL N/A 10/25/2018   Procedure: ESOPHAGOGASTRODUODENOSCOPY (EGD) WITH PROPOFOL;  Surgeon: [name], MD;   Location: [facility] ENDOSCOPY;  Service: Endoscopy;  Laterality: N/A;  . HERNIA REPAIR     umbilical  . HIATAL HERNIA REPAIR    . I&D EXTREMITY Left 06/27/2015   Procedure: IRRIGATION AND DEBRIDEMENT EXTREMITY            ( CALF HEMATOMA ) POSSIBLE WOUND VAC;  Surgeon: [name], MD;  Location: [facility] ORS;  Service: Vascular;  Laterality: Left;  . INCISION AND DRAINAGE OF WOUND Left 09/25/2016   Procedure: IRRIGATION AND DEBRIDEMENT - PARTIAL RESECTION OF ACHILLES TENDON WITH WOUND VAC APPLICATION;  Surgeon: [name], DPM;  Location: [facility];  Service: Podiatry;  Laterality: Left;  . IRRIGATION AND DEBRIDEMENT ABSCESS Left 09/07/2016   Procedure: IRRIGATION AND DEBRIDEMENT ABSCESS LEFT HEEL;  Surgeon: [name], DPM;  Location: [facility] OR[facility];  Service: Podiatry;  Laterality: Left;  . JOINT REPLACEMENT  2013   left knee replacement  . KIDNEY SURGERY Right    kidney stones  . LITHOTRIPSY    . LOWER EXTREMITY ANGIOGRAPHY Left 04/04/2018   Procedure: LOWER EXTREMITY ANGIOGRAPHY;  Surgeon: [name], MD;  Location: [facility] LAB;  Service: Cardiovascular;  Laterality: Left;  . NM GATED MYOVIEW ([place] [facility])  February 2017   Likely breast attenuation. LOW RISK study. Normal EF 55-60%.  . OTHER SURGICAL HISTORY     08/2016 or 09/2016 surgery on achilles tenso h/o staph infection heal. Dr. [name]  . removal of left hematoma Left    leg  . REVERSE SHOULDER ARTHROPLASTY Right 10/08/2015   Procedure: REVERSE SHOULDER ARTHROPLASTY;  Surgeon: [name], MD;  Location: [facility] OR[facility];  Service: Orthopedics;  Laterality: Right;  . SLEEVE GASTROPLASTY    . TONSILLECTOMY    . TOTAL KNEE ARTHROPLASTY    . TOTAL SHOULDER REPLACEMENT Right   . TRANSESOPHAGEAL ECHOCARDIOGRAM  08/08/2013   Mild LVH, EF 60-65%. Moderate LA dilation and mild RA dilation. Mild MR with no evidence of stenosis and no evidence of endocarditis. A false chordae is noted.  . TRANSTHORACIC ECHOCARDIOGRAM  08/03/2013   Mild-Moderate  concentric LVH, EF 60-65%. Normal diastolic function. Mild LA dilation. Mild MR with possible vegetation  - not confirmed on TEE   . ULNAR NERVE TRANSPOSITION Right 08/06/2016   Procedure: ULNAR NERVE DECOMPRESSION/TRANSPOSITION;  Surgeon: [name], MD;  Location: [facility] ORS;  Service: Orthopedics;  Laterality: Right;  . UPPER GI ENDOSCOPY    . VAGINAL HYSTERECTOMY     Family History  Problem Relation Age of Onset  . Skin cancer Father   . Diabetes Father   . Hypertension Father   . Peripheral vascular disease Father   . Cancer Father   . Cerebral aneurysm Father   . Alcohol abuse Father   . Varicose Veins Mother   . Kidney disease Mother   . Arthritis Mother   . Mental illness Sister   . Cancer Maternal Aunt  breast  . Breast cancer Maternal Aunt   . Arthritis Maternal Grandmother   . Hypertension Maternal Grandmother   . Diabetes Maternal Grandmother   . Arthritis Maternal Grandfather   . Heart disease Maternal Grandfather   . Hypertension Maternal Grandfather   . Arthritis Paternal Grandmother   . Hypertension Paternal Grandmother   . Diabetes Paternal Grandmother   . Arthritis Paternal Grandfather   . Hypertension Paternal Grandfather   . Bladder Cancer Neg Hx   . Kidney cancer Neg Hx    Social History   Socioeconomic History  . Marital status: Married    Spouse name: Not on file  . Number of children: Not on file  . Years of education: Not on file  . Highest education level: Not on file  Occupational History  . Not on file  Social Needs  . Financial resource strain: Not on file  . Food insecurity    Worry: Not on file    Inability: Not on file  . Transportation needs    Medical: Not on file    Non-medical: Not on file  Tobacco Use  . Smoking status: Never Smoker  . Smokeless tobacco: Never Used  Substance and Sexual Activity  . Alcohol use: No    Alcohol/week: 0.0 standard drinks  . Drug use: No  . Sexual activity: Yes  Lifestyle  . Physical  activity    Days per week: Not on file    Minutes per session: Not on file  . Stress: Not on file  Relationships  . Social Herbalist on phone: Not on file    Gets together: Not on file    Attends religious service: Not on file    Active member of club or organization: Not on file    Attends meetings of clubs or organizations: Not on file    Relationship status: Not on file  . Intimate partner violence    Fear of current or ex partner: Not on file    Emotionally abused: Not on file    Physically abused: Not on file    Forced sexual activity: Not on file  Other Topics Concern  . Not on file  Social History Narrative   She is currently married -- for 30+ years. Does not work. Does not smoke or take alcohol. She never smoked. She exercises at least 3 days a week since before her gastric surgery.   Marital status reviewed in history of present illness.   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   Current Facility-Administered Medications for the 11/08/18 encounter (Office Visit) with [name], [name], MD  Medication  . ipratropium-albuterol (DUONEB) 0.5-2.5 (3) MG/3ML nebulizer solution 3 mL   Allergies  Allergen Reactions  . Penicillins Hives, Shortness Of Breath, Swelling and Other (See Comments)    Facial swelling Has patient had a PCN reaction causing immediate rash, facial/tongue/throat swelling, SOB or lightheadedness with hypotension: Yes Has patient had a PCN reaction causing severe rash involving mucus membranes or skin necrosis: Yes Has patient had a PCN reaction that required hospitalization No Has patient had a PCN reaction occurring within the last 10 years: No If all of the above answers are "NO", then may proceed with Cephalosporin use.   . Aspirin Hives  .  Morphine And Related Other (See Comments)    Patient becomes very confused  . Terramycin [Oxytetracycline] Hives   Recent Results (from the past 2160 hour(s))  Novel Coronavirus, NAA ([facility])     Status: None   Collection Time: 09/15/18  8:19 AM  Result Value Ref Range   SARS-CoV-2, NAA Not Detected Not Detected    Comment: Testing was performed using the cobas(R) SARS-CoV-2 test. This test was developed and its performance characteristics determined by [employer]. This test has not been FDA cleared or approved. This test has been authorized by FDA under an Emergency Use Authorization (EUA). This test is only authorized for the duration of time the declaration that circumstances exist justifying the authorization of the emergency use of in vitro diagnostic tests for detection of SARS-CoV-2 virus and/or diagnosis of COVID-19 infection under section 564(b)(1) of the Act, 21 U.S.C. 229NLG-9(Q)(1), unless the authorization is terminated or revoked sooner. When diagnostic testing is negative, the possibility of a false negative result should be considered in the context of a patient's recent exposures and the presence of clinical signs and symptoms consistent with COVID-19. An individual without symptoms of COVID-19 and who is not shedding SARS-CoV-2 virus would expect to have a negati ve (not detected) result in this assay.   CBC with Differential     Status: Abnormal   Collection Time: 09/28/18  4:03 PM  Result Value Ref Range   WBC 5.8 4.0 - 10.5 K/uL   RBC 3.74 (L) 3.87 - 5.11 MIL/uL   Hemoglobin 10.8 (L) 12.0 - 15.0 g/dL   HCT 33.4 (L) 36.0 - 46.0 %   MCV 89.3 80.0 - 100.0 fL   MCH 28.9 26.0 - 34.0 pg   MCHC 32.3 30.0 - 36.0 g/dL   RDW 14.1 11.5 - 15.5 %   Platelets 218 150 - 400 K/uL   nRBC 0.0 0.0 - 0.2 %   Neutrophils Relative % 65 %   Neutro Abs 3.8 1.7 - 7.7 K/uL   Lymphocytes Relative 18 %   Lymphs Abs 1.0 0.7 - 4.0 K/uL   Monocytes Relative 11 %    Monocytes Absolute 0.6 0.1 - 1.0 K/uL   Eosinophils Relative 5 %   Eosinophils Absolute 0.3 0.0 - 0.5 K/uL   Basophils Relative 1 %   Basophils Absolute 0.1 0.0 - 0.1 K/uL   Immature Granulocytes 0 %   Abs Immature Granulocytes 0.02 0.00 - 0.07 K/uL    Comment: Performed at [facility], [address] [place], [place] [address]  Comprehensive metabolic panel     Status: Abnormal   Collection Time: 09/28/18  4:03 PM  Result Value Ref Range   Sodium 140 135 - 145 mmol/L   Potassium 5.0 3.5 - 5.1 mmol/L   Chloride 106 98 - 111 mmol/L   CO2 26 22 - 32 mmol/L   Glucose, Bld 127 (H) 70 - 99 mg/dL   BUN 20 8 - 23 mg/dL   Creatinine, Ser 1.15 (H) 0.44 - 1.00 mg/dL   Calcium 8.9 8.9 - 10.3 mg/dL   Total Protein 6.6 6.5 - 8.1 g/dL   Albumin 3.7 3.5 - 5.0 g/dL   AST 18 15 - 41 U/L   ALT 13 0 - 44 U/L   Alkaline Phosphatase 84 38 - 126 U/L   Total Bilirubin 0.3 0.3 - 1.2 mg/dL   GFR calc non Af Amer 49 (L) >60 mL/min   GFR calc Af Amer 56 (L) >60 mL/min  Anion gap 8 5 - 15    Comment: Performed at [facility], [address] [place], [place] [address]  Lactic acid, plasma     Status: None   Collection Time: 09/28/18  4:03 PM  Result Value Ref Range   Lactic Acid, Venous 1.5 0.5 - 1.9 mmol/L    Comment: Performed at [facility], [address] [place], [place] [address]  Urinalysis, Complete w Microscopic     Status: Abnormal   Collection Time: 09/28/18  6:40 PM  Result Value Ref Range   Color, Urine YELLOW (A) YELLOW   APPearance HAZY (A) CLEAR   Specific Gravity, Urine 1.008 1.005 - 1.030   pH 5.0 5.0 - 8.0   Glucose, UA NEGATIVE NEGATIVE mg/dL   Hgb urine dipstick MODERATE (A) NEGATIVE   Bilirubin Urine NEGATIVE NEGATIVE   Ketones, ur NEGATIVE NEGATIVE mg/dL   Protein, ur NEGATIVE NEGATIVE mg/dL   Nitrite NEGATIVE NEGATIVE   Leukocytes,Ua SMALL (A) NEGATIVE   RBC / HPF 11-20 0 - 5 RBC/hpf   WBC, UA 21-50 0 - 5 WBC/hpf   Bacteria, UA NONE  SEEN NONE SEEN   Squamous Epithelial / LPF 0-5 0 - 5   WBC Clumps PRESENT     Comment: Performed at [facility], [address] [place], [place] [address]  Novel Coronavirus,NAA,(SEND-OUT TO REF LAB - TAT 24-48 hrs); Hosp Order     Status: None   Collection Time: 09/28/18  7:20 PM   Specimen: Nasopharyngeal Swab; Respiratory  Result Value Ref Range   SARS-CoV-2, NAA NOT DETECTED NOT DETECTED    Comment: (NOTE) This test was developed and its performance characteristics determined by [employer]. This test has not been FDA cleared or approved. This test has been authorized by FDA under an Emergency Use Authorization (EUA). This test is only authorized for the duration of time the declaration that circumstances exist justifying the authorization of the emergency use of in vitro diagnostic tests for detection of SARS-CoV-2 virus and/or diagnosis of COVID-19 infection under section 564(b)(1) of the Act, 21 U.S.C. 696EXB-2(W)(4), unless the authorization is terminated or revoked sooner. When diagnostic testing is negative, the possibility of a false negative result should be considered in the context of a patient's recent exposures and the presence of clinical signs and symptoms consistent with COVID-19. An individual without symptoms of COVID-19 and who is not shedding SARS-CoV-2 virus would expect to have a negative (not detected) result in this assay. Performed  At: [facility] [address] [place], [place] [id number] Rush Farmer MD VO:[phone]    Coronavirus Source NASOPHARYNGEAL     Comment: Performed at [facility], [address] [place],  [address]  SARS CORONAVIRUS 2 Nasal Swab     Status: None   Collection Time: 10/21/18  1:12 PM   Specimen: Nasal Swab  Result Value Ref Range   SARS Coronavirus 2 NEGATIVE NEGATIVE    Comment: (NOTE) SARS-CoV-2 target nucleic acids are NOT DETECTED. The SARS-CoV-2 RNA is generally  detectable in upper and lower respiratory specimens during the acute phase of infection. Negative results do not preclude SARS-CoV-2 infection, do not rule out co-infections with other pathogens, and should not be used as the sole basis for treatment or other patient management decisions. Negative results must be combined with clinical observations, patient history, and epidemiological information. The expected result is Negative. Fact Sheet for Patients: SugarRoll.be Fact Sheet for Healthcare Providers: https://www.woods-mathews.com/ This test is not yet approved  or cleared by the [place] and  has been authorized for detection and/or diagnosis of SARS-CoV-2 by FDA under an Emergency Use Authorization (EUA). This EUA will remain  in effect (meaning this test can be used) for the duration of the COVID-19 declaration under Section 56 4(b)(1) of the Act, 21 U.S.C. section 360bbb-3(b)(1), unless the authorization is terminated or revoked sooner. Performed at [facility], [place] [address] [place], [place] [address]   Urinalysis, Complete     Status: Abnormal   Collection Time: 10/24/18  2:26 PM  Result Value Ref Range   Specific Gravity, UA 1.025 1.005 - 1.030   pH, UA 5.0 5.0 - 7.5   Color, UA Yellow Yellow   Appearance Ur Clear Clear   Leukocytes,UA Negative Negative   Protein,UA 1+ (A) Negative/Trace   Glucose, UA Negative Negative   Ketones, UA Trace (A) Negative   RBC, UA Negative Negative   Bilirubin, UA Negative Negative   Urobilinogen, Ur 0.2 0.2 - 1.0 mg/dL   Nitrite, UA Negative Negative   Microscopic Examination See below:   CULTURE, URINE COMPREHENSIVE     Status: Abnormal   Collection Time: 10/24/18  2:26 PM   Specimen: Urine   UR  Result Value Ref Range   Urine Culture, Comprehensive Final report (A)    Organism ID, Bacteria Comment (A)     Comment: Pseudomonas aeruginosa 25,000-50,000 colony forming units  per mL    ANTIMICROBIAL SUSCEPTIBILITY Comment     Comment:       ** S = Susceptible; I = Intermediate; R = Resistant **                    P = Positive; N = Negative             MICS are expressed in micrograms per mL    Antibiotic                 RSLT#1    RSLT#2    RSLT#3    RSLT#4 Amikacin                       S Cefepime                       S Ceftazidime                    S Ciprofloxacin                  S Gentamicin                     S Imipenem                       S Levofloxacin                   S Meropenem                      S Piperacillin                   S Ticarcillin                    S Tobramycin                     S   Microscopic Examination  Status: None   Collection Time: 10/24/18  2:26 PM   URINE  Result Value Ref Range   WBC, UA 0-5 0 - 5 /hpf   RBC None seen 0 - 2 /hpf   Epithelial Cells (non renal) 0-10 0 - 10 /hpf   Bacteria, UA Few None seen/Few  Surgical pathology     Status: None   Collection Time: 10/25/18 11:33 AM  Result Value Ref Range   SURGICAL PATHOLOGY      Surgical Pathology CASE: [id number] PATIENT: Vernis Puebla Surgical Pathology Report     SPECIMEN SUBMITTED: A. Stomach, antrum; cbx B. Stomach, body; cbx C. Stomach polyp, body; cbx D. GEJ; cbx  CLINICAL HISTORY: None provided  PRE-OPERATIVE DIAGNOSIS: Dysphagia  POST-OPERATIVE DIAGNOSIS: Gastric polyp, small hiatal hernia, presbyesophagus, post-operative anatomy     DIAGNOSIS: A.  STOMACH, ANTRUM; COLD BIOPSY: - REACTIVE GASTROPATHY. - NEGATIVE FOR ACTIVE INFLAMMATION AND H. PYLORI. - NEGATIVE FOR INTESTINAL METAPLASIA, DYSPLASIA, AND MALIGNANCY.  B.  STOMACH, BODY; COLD BIOPSY: - REACTIVE GASTROPATHY. - NEGATIVE FOR ACTIVE INFLAMMATION AND H. PYLORI. - NEGATIVE FOR INTESTINAL METAPLASIA, DYSPLASIA, AND MALIGNANCY.  C.  STOMACH POLYP, BODY; COLD BIOPSY: - GASTRIC ADENOMA, FOVEOLAR TYPE. - NEGATIVE FOR HIGH GRADE DYSPLASIA AND  MALIGNANCY.  Comment: Step sections are examined.  D. GEJ, COLD BIOPSY: - SQUAMOCOLUMNAR JUNCTION WITH ACUTE AND  CHRONIC INFLAMMATION AND REACTIVE CHANGES. - NEGATIVE FOR INTESTINAL METAPLASIA, DYSPLASIA, AND MALIGNANCY.   GROSS DESCRIPTION: A. Labeled: Antrum C BX Received: In formalin Tissue fragment(s): 2 Size: 0.2 and 0.3 cm Description: Tan soft tissue fragments Entirely submitted in 1 cassette.  B. Labeled: Body of stomach C BX Received: In formalin Tissue fragment(s): 1 Size: 0.4 cm Description: Tan soft tissue fragment Entirely submitted in 1 cassette.  C. Labeled: Gastric body polyp C BX Received: In formalin Tissue fragment(s): 1 Size: Six 0.2 cm Description: Tan soft tissue fragment Entirely submitted in 1 cassette.  D. Labeled: C BX GEJ Received: In formalin Tissue fragment(s): 2 Size: Six 0.3 and 0.4 cm Description: Tan soft tissue fragments Entirely submitted in 1 cassette.   Final Diagnosis performed by [name] Pries, MD.   Electronically signed 10/27/2018 1:18:51PM The electronic signature indicates that the named Attending Pathologist has eva luated the specimen  Technical component performed at [facility], [address] [place], [place] [address] Lab: [phone] Dir: Rush Farmer, MD, MMM  Professional component performed at [facility], [place], [place], [place] [address] Lab: [phone] Dir: Dellia Nims. Rubinas, MD   SARS CORONAVIRUS 2 Nasal Swab Aptima Multi Swab     Status: None   Collection Time: 11/05/18  4:33 PM   Specimen: Aptima Multi Swab; Nasal Swab  Result Value Ref Range   SARS Coronavirus 2 NEGATIVE NEGATIVE    Comment: (NOTE) SARS-CoV-2 target nucleic acids are NOT DETECTED. The SARS-CoV-2 RNA is generally detectable in upper and lower respiratory specimens during the acute phase of infection. Negative results do not preclude SARS-CoV-2 infection, do not rule out co-infections with other  pathogens, and should not be used as the sole basis for treatment or other patient management decisions. Negative results must be combined with clinical observations, patient history, and epidemiological information. The expected result is Negative. Fact Sheet for Patients: SugarRoll.be Fact Sheet for Healthcare Providers: https://www.woods-mathews.com/ This test is not yet approved or cleared by the [place] FDA and  has been authorized for detection and/or diagnosis of SARS-CoV-2 by FDA under an Emergency Use Authorization (EUA). This EUA will  remain  in effect (meaning this test can be used) for the duration of the COVID-19 declaration under Section 56 4(b)(1) of the Act, 21 U.S.C. section 360bbb-3(b)(1), unless the authorization is terminated or revoked sooner. Performed at [facility], [place] [address], [place], [place] [address]    Objective  There is no height or weight on file to calculate BMI. Wt Readings from Last 3 Encounters:  10/25/18 248 lb (112.5 kg)  10/24/18 250 lb (113.4 kg)  10/18/18 252 lb (114.3 kg)   Temp Readings from Last 3 Encounters:  11/08/18 (!) 97.3 F (36.3 C) (Temporal)  10/25/18 (!) 97.5 F (36.4 C) (Tympanic)  10/18/18 97.6 F (36.4 C) (Temporal)   BP Readings from Last 3 Encounters:  11/08/18 (!) 150/78  10/25/18 (!) 166/91  10/24/18 (!) 143/86   Pulse Readings from Last 3 Encounters:  11/08/18 64  10/25/18 65  10/24/18 76    Physical Exam Vitals signs and nursing note reviewed.  Constitutional:      Appearance: Normal appearance. She is well-developed and well-groomed. She is obese.  HENT:     Head: Normocephalic and atraumatic.     Comments: +mask on   Eyes:     Conjunctiva/sclera: Conjunctivae normal.     Pupils: Pupils are equal, round, and reactive to light.  Cardiovascular:     Rate and Rhythm: Normal rate and regular rhythm.     Heart sounds: Normal heart sounds.      Comments: NSR today   Pulmonary:     Breath sounds: Wheezing present.     Comments: Mild wheezing left lung field  Skin:    General: Skin is warm and dry.     Comments: +right shin wound new    Neurological:     General: No focal deficit present.     Mental Status: She is alert and oriented to person, place, and time. Mental status is at baseline.     Comments: rollator used today   Psychiatric:        Attention and Perception: Attention and perception normal.        Mood and Affect: Mood and affect normal.        Speech: Speech normal.        Behavior: Behavior normal. Behavior is cooperative.        Thought Content: Thought content normal.        Cognition and Memory: Cognition and memory normal.        Judgment: Judgment normal.     Assessment  /plan 1. preop clearance approved for surgery 11/09/2018 left heel chronic wound  revised cardiac risk score 6 % today score of 1 for h/o stroke in 2006  No chest pain  Breathing normally except mild wheezing left lung field  EKG no change 03/2018 and 07/2018 w/o chest pain in NSR today and off pradaxa for Afib since 11/03/18 to resume 24 hrs after surgery plastics  2. Anxiety  Cont meds no changes  Pt to consider therapy with osman 3. HTN BP sl elevated today on med s  If continues in future consider losartan 100 mg but other meds stable  Per pt BP yesterday 8/17 was 128/72   Seen [facility] RN [place] 01/16/19 phone [phone], fax [phone] Right ankle pain Xray done in home with limited mobility at baseline toradol im x 1 given, RICE f/u ortho/podiatry h/o ankle fracture >20 years ago  Right ankle fracture age indeterminate with right ankle pain  had Xray 01/16/19 and given Toradol injection [facility] nurse which helped   Provider: Dr. Olivia Mackie [name]

## 2018-11-09 ENCOUNTER — Encounter (HOSPITAL_COMMUNITY)
Admission: RE | Disposition: A | Discharge status: Home / Self Care | Payer: Self-pay | Source: Home / Self Care | Attending: Plastic Surgery

## 2018-11-09 ENCOUNTER — Encounter (HOSPITAL_COMMUNITY): Payer: Self-pay

## 2018-11-09 ENCOUNTER — Other Ambulatory Visit: Payer: Self-pay

## 2018-11-09 ENCOUNTER — Ambulatory Visit (HOSPITAL_COMMUNITY)
Admission: RE | Admit: 2018-11-09 | Discharge: 2018-11-09 | Disposition: A | Discharge status: Home / Self Care | Payer: PPO | Attending: Plastic Surgery | Admitting: Plastic Surgery

## 2018-11-09 ENCOUNTER — Ambulatory Visit (HOSPITAL_COMMUNITY): Payer: PPO | Admitting: Physician Assistant

## 2018-11-09 DIAGNOSIS — K219 Gastro-esophageal reflux disease without esophagitis: Secondary | ICD-10-CM | POA: Insufficient documentation

## 2018-11-09 DIAGNOSIS — E1142 Type 2 diabetes mellitus with diabetic polyneuropathy: Secondary | ICD-10-CM | POA: Diagnosis not present

## 2018-11-09 DIAGNOSIS — X58XXXA Exposure to other specified factors, initial encounter: Secondary | ICD-10-CM | POA: Insufficient documentation

## 2018-11-09 DIAGNOSIS — L97522 Non-pressure chronic ulcer of other part of left foot with fat layer exposed: Secondary | ICD-10-CM | POA: Diagnosis not present

## 2018-11-09 DIAGNOSIS — E11621 Type 2 diabetes mellitus with foot ulcer: Secondary | ICD-10-CM | POA: Diagnosis not present

## 2018-11-09 DIAGNOSIS — I10 Essential (primary) hypertension: Secondary | ICD-10-CM | POA: Diagnosis not present

## 2018-11-09 DIAGNOSIS — Z6841 Body Mass Index (BMI) 40.0 and over, adult: Secondary | ICD-10-CM | POA: Insufficient documentation

## 2018-11-09 DIAGNOSIS — G4733 Obstructive sleep apnea (adult) (pediatric): Secondary | ICD-10-CM | POA: Insufficient documentation

## 2018-11-09 DIAGNOSIS — E1151 Type 2 diabetes mellitus with diabetic peripheral angiopathy without gangrene: Secondary | ICD-10-CM | POA: Insufficient documentation

## 2018-11-09 DIAGNOSIS — S81802A Unspecified open wound, left lower leg, initial encounter: Secondary | ICD-10-CM | POA: Insufficient documentation

## 2018-11-09 DIAGNOSIS — Z7984 Long term (current) use of oral hypoglycemic drugs: Secondary | ICD-10-CM | POA: Diagnosis not present

## 2018-11-09 DIAGNOSIS — S91302A Unspecified open wound, left foot, initial encounter: Secondary | ICD-10-CM | POA: Diagnosis not present

## 2018-11-09 DIAGNOSIS — I480 Paroxysmal atrial fibrillation: Secondary | ICD-10-CM | POA: Diagnosis not present

## 2018-11-09 HISTORY — PX: INCISION AND DRAINAGE OF WOUND: SHX1803

## 2018-11-09 HISTORY — DX: Dyspnea, unspecified: R06.00

## 2018-11-09 HISTORY — DX: Personal history of urinary calculi: Z87.442

## 2018-11-09 LAB — BASIC METABOLIC PANEL
Anion gap: 9 (ref 5–15)
BUN: 21 mg/dL (ref 8–23)
CO2: 26 mmol/L (ref 22–32)
Calcium: 9.2 mg/dL (ref 8.9–10.3)
Chloride: 105 mmol/L (ref 98–111)
Creatinine, Ser: 1.22 mg/dL — ABNORMAL HIGH (ref 0.44–1.00)
GFR calc Af Amer: 52 mL/min — ABNORMAL LOW (ref >60)
GFR calc non Af Amer: 45 mL/min — ABNORMAL LOW (ref >60)
Glucose, Bld: 117 mg/dL — ABNORMAL HIGH (ref 70–99)
Potassium: 4.6 mmol/L (ref 3.5–5.1)
Sodium: 140 mmol/L (ref 135–145)

## 2018-11-09 LAB — CBC
HCT: 38.2 % (ref 36.0–46.0)
Hemoglobin: 12.3 g/dL (ref 12.0–15.0)
MCH: 28.5 pg (ref 26.0–34.0)
MCHC: 32.2 g/dL (ref 30.0–36.0)
MCV: 88.4 fL (ref 80.0–100.0)
Platelets: 283 10*3/uL (ref 150–400)
RBC: 4.32 MIL/uL (ref 3.87–5.11)
RDW: 12.9 % (ref 11.5–15.5)
WBC: 5.9 10*3/uL (ref 4.0–10.5)
nRBC: 0 % (ref 0.0–0.2)

## 2018-11-09 LAB — APTT: aPTT: 27 seconds (ref 24–36)

## 2018-11-09 LAB — GLUCOSE, CAPILLARY
Glucose-Capillary: 103 mg/dL — ABNORMAL HIGH (ref 70–99)
Glucose-Capillary: 114 mg/dL — ABNORMAL HIGH (ref 70–99)

## 2018-11-09 LAB — PROTIME-INR
INR: 1 (ref 0.8–1.2)
Prothrombin Time: 12.9 seconds (ref 11.4–15.2)

## 2018-11-09 SURGERY — IRRIGATION AND DEBRIDEMENT WOUND
Anesthesia: General | Site: Foot | Laterality: Left

## 2018-11-09 MED ORDER — 0.9 % SODIUM CHLORIDE (POUR BTL) OPTIME
TOPICAL | Status: DC | PRN
  Administered 2018-11-09: 1000 mL

## 2018-11-09 MED ORDER — FENTANYL CITRATE (PF) 100 MCG/2ML IJ SOLN
INTRAMUSCULAR | Status: DC | PRN
  Administered 2018-11-09: 100 ug via INTRAVENOUS

## 2018-11-09 MED ORDER — CIPROFLOXACIN IN D5W 400 MG/200ML IV SOLN
400.0000 mg | INTRAVENOUS | Status: AC
  Administered 2018-11-09: 400 mg via INTRAVENOUS
  Filled 2018-11-09: qty 200

## 2018-11-09 MED ORDER — LIDOCAINE-EPINEPHRINE 1 %-1:100000 IJ SOLN
INTRAMUSCULAR | Status: DC | PRN
  Administered 2018-11-09: 1 mL

## 2018-11-09 MED ORDER — FENTANYL CITRATE (PF) 100 MCG/2ML IJ SOLN: 25.0000 ug | INTRAMUSCULAR | Status: DC | PRN

## 2018-11-09 MED ORDER — SODIUM CHLORIDE 0.9 % IV SOLN
INTRAVENOUS | Status: AC
  Filled 2018-11-09: qty 500000

## 2018-11-09 MED ORDER — CHLORHEXIDINE GLUCONATE CLOTH 2 % EX PADS: 6.0000 | MEDICATED_PAD | Freq: Once | CUTANEOUS | Status: DC

## 2018-11-09 MED ORDER — PROPOFOL 10 MG/ML IV BOLUS
INTRAVENOUS | Status: DC | PRN
  Administered 2018-11-09: 130 mg via INTRAVENOUS

## 2018-11-09 MED ORDER — ALBUTEROL SULFATE (2.5 MG/3ML) 0.083% IN NEBU
INHALATION_SOLUTION | RESPIRATORY_TRACT | Status: AC
  Filled 2018-11-09: qty 3

## 2018-11-09 MED ORDER — ONDANSETRON HCL 4 MG/2ML IJ SOLN
INTRAMUSCULAR | Status: DC | PRN
  Administered 2018-11-09: 4 mg via INTRAVENOUS

## 2018-11-09 MED ORDER — MEPERIDINE HCL 25 MG/ML IJ SOLN: 6.2500 mg | INTRAMUSCULAR | Status: DC | PRN

## 2018-11-09 MED ORDER — STERILE WATER FOR IRRIGATION IR SOLN
Status: DC | PRN
  Administered 2018-11-09: 1000 mL

## 2018-11-09 MED ORDER — SODIUM CHLORIDE 0.9% FLUSH: 3.0000 mL | INTRAVENOUS | Status: DC | PRN

## 2018-11-09 MED ORDER — LIDOCAINE 2% (20 MG/ML) 5 ML SYRINGE
INTRAMUSCULAR | Status: DC | PRN
  Administered 2018-11-09: 100 mg via INTRAVENOUS

## 2018-11-09 MED ORDER — SODIUM CHLORIDE 0.9 % IV SOLN
INTRAVENOUS | Status: DC | PRN
  Administered 2018-11-09: 500 mL

## 2018-11-09 MED ORDER — SODIUM CHLORIDE 0.9% FLUSH: 3.0000 mL | Freq: Two times a day (BID) | INTRAVENOUS | Status: DC

## 2018-11-09 MED ORDER — LACTATED RINGERS IV SOLN
INTRAVENOUS | Status: DC | PRN
  Administered 2018-11-09: 12:00:00 via INTRAVENOUS

## 2018-11-09 MED ORDER — MIDAZOLAM HCL 2 MG/2ML IJ SOLN
INTRAMUSCULAR | Status: AC
  Filled 2018-11-09: qty 2

## 2018-11-09 MED ORDER — ACETAMINOPHEN 650 MG RE SUPP: 650.0000 mg | RECTAL | Status: DC | PRN

## 2018-11-09 MED ORDER — ACETAMINOPHEN 325 MG PO TABS: 650.0000 mg | ORAL_TABLET | ORAL | Status: DC | PRN

## 2018-11-09 MED ORDER — METOCLOPRAMIDE HCL 5 MG/ML IJ SOLN: 10.0000 mg | Freq: Once | INTRAMUSCULAR | Status: DC | PRN

## 2018-11-09 MED ORDER — SODIUM CHLORIDE 0.9 % IV SOLN: 250.0000 mL | INTRAVENOUS | Status: DC | PRN

## 2018-11-09 MED ORDER — PROPOFOL 10 MG/ML IV BOLUS
INTRAVENOUS | Status: AC
  Filled 2018-11-09: qty 40

## 2018-11-09 MED ORDER — ALBUTEROL SULFATE (2.5 MG/3ML) 0.083% IN NEBU
2.5000 mg | INHALATION_SOLUTION | Freq: Once | RESPIRATORY_TRACT | Status: AC
  Administered 2018-11-09: 2.5 mg via RESPIRATORY_TRACT

## 2018-11-09 MED ORDER — OXYCODONE HCL 5 MG PO TABS: 5.0000 mg | ORAL_TABLET | ORAL | Status: DC | PRN

## 2018-11-09 MED ORDER — MIDAZOLAM HCL 5 MG/5ML IJ SOLN
INTRAMUSCULAR | Status: DC | PRN
  Administered 2018-11-09 (×2): 1 mg via INTRAVENOUS

## 2018-11-09 MED ORDER — FENTANYL CITRATE (PF) 250 MCG/5ML IJ SOLN
INTRAMUSCULAR | Status: AC
  Filled 2018-11-09: qty 5

## 2018-11-09 MED ORDER — LIDOCAINE-EPINEPHRINE 1 %-1:100000 IJ SOLN
INTRAMUSCULAR | Status: AC
  Filled 2018-11-09: qty 1

## 2018-11-09 SURGICAL SUPPLY — 50 items
APL SKNCLS STERI-STRIP NONHPOA (GAUZE/BANDAGES/DRESSINGS) ×1
APL SWBSTK 6 STRL LF DISP (MISCELLANEOUS)
APPLICATOR COTTON TIP 6 STRL (MISCELLANEOUS) IMPLANT
APPLICATOR COTTON TIP 6IN STRL (MISCELLANEOUS)
BAG DECANTER FOR FLEXI CONT (MISCELLANEOUS) IMPLANT
BENZOIN TINCTURE PRP APPL 2/3 (GAUZE/BANDAGES/DRESSINGS) ×2 IMPLANT
BNDG ELASTIC 4X5.8 VLCR STR LF (GAUZE/BANDAGES/DRESSINGS) ×4 IMPLANT
BNDG GAUZE ELAST 4 BULKY (GAUZE/BANDAGES/DRESSINGS) ×4 IMPLANT
CANISTER SUCT 3000ML PPV (MISCELLANEOUS) ×2 IMPLANT
CONT SPEC 4OZ CLIKSEAL STRL BL (MISCELLANEOUS) IMPLANT
COVER SURGICAL LIGHT HANDLE (MISCELLANEOUS) ×2 IMPLANT
COVER WAND RF STERILE (DRAPES) ×2 IMPLANT
DRAPE HALF SHEET 40X57 (DRAPES) IMPLANT
DRAPE IMP U-DRAPE 54X76 (DRAPES) ×2 IMPLANT
DRAPE INCISE IOBAN 66X45 STRL (DRAPES) IMPLANT
DRAPE LAPAROSCOPIC ABDOMINAL (DRAPES) IMPLANT
DRAPE LAPAROTOMY 100X72 PEDS (DRAPES) ×2 IMPLANT
DRESSING HYDROCOLLOID 4X4 (GAUZE/BANDAGES/DRESSINGS) ×2 IMPLANT
DRSG ADAPTIC 3X8 NADH LF (GAUZE/BANDAGES/DRESSINGS) IMPLANT
DRSG CUTIMED SORBACT 7X9 (GAUZE/BANDAGES/DRESSINGS) ×2 IMPLANT
DRSG PAD ABDOMINAL 8X10 ST (GAUZE/BANDAGES/DRESSINGS) IMPLANT
DRSG VAC ATS LRG SENSATRAC (GAUZE/BANDAGES/DRESSINGS) IMPLANT
DRSG VAC ATS MED SENSATRAC (GAUZE/BANDAGES/DRESSINGS) IMPLANT
DRSG VAC ATS SM SENSATRAC (GAUZE/BANDAGES/DRESSINGS) IMPLANT
ELECT CAUTERY BLADE 6.4 (BLADE) IMPLANT
ELECT REM PT RETURN 9FT ADLT (ELECTROSURGICAL) ×2
ELECTRODE REM PT RTRN 9FT ADLT (ELECTROSURGICAL) ×1 IMPLANT
GAUZE SPONGE 4X4 12PLY STRL (GAUZE/BANDAGES/DRESSINGS) IMPLANT
GLOVE BIO SURGEON STRL SZ 6.5 (GLOVE) ×2 IMPLANT
GOWN STRL REUS W/ TWL LRG LVL3 (GOWN DISPOSABLE) ×3 IMPLANT
GOWN STRL REUS W/TWL LRG LVL3 (GOWN DISPOSABLE) ×6
KIT BASIN OR (CUSTOM PROCEDURE TRAY) ×2 IMPLANT
KIT TURNOVER KIT B (KITS) ×2 IMPLANT
MATRIX SURGICAL PSM 5X5CM (Tissue) ×1 IMPLANT
MICROMATRIX 500MG (Tissue) ×2 IMPLANT
NEEDLE HYPO 25GX1X1/2 BEV (NEEDLE) ×2 IMPLANT
NS IRRIG 1000ML POUR BTL (IV SOLUTION) ×2 IMPLANT
PACK GENERAL/GYN (CUSTOM PROCEDURE TRAY) ×2 IMPLANT
PACK UNIVERSAL I (CUSTOM PROCEDURE TRAY) ×2 IMPLANT
PAD ARMBOARD 7.5X6 YLW CONV (MISCELLANEOUS) ×4 IMPLANT
SOLUTION PARTIC MCRMTRX 500MG (Tissue) IMPLANT
STAPLER VISISTAT 35W (STAPLE) ×2 IMPLANT
SURGILUBE 2OZ TUBE FLIPTOP (MISCELLANEOUS) IMPLANT
SUT MNCRL AB 4-0 PS2 18 (SUTURE) IMPLANT
SUT VIC AB 5-0 PS2 18 (SUTURE) ×4 IMPLANT
SWAB COLLECTION DEVICE MRSA (MISCELLANEOUS) IMPLANT
SWAB CULTURE ESWAB REG 1ML (MISCELLANEOUS) IMPLANT
SYR CONTROL 10ML LL (SYRINGE) ×2 IMPLANT
TOWEL GREEN STERILE (TOWEL DISPOSABLE) ×2 IMPLANT
UNDERPAD 30X30 (UNDERPADS AND DIAPERS) ×2 IMPLANT

## 2018-11-09 NOTE — Transfer of Care (Signed)
Immediate Anesthesia Transfer of Care Note  Patient: [name]  Procedure(s) Performed: Excision of left foot wound with ACell placement (Left Foot)  Patient Location: PACU  Anesthesia Type:General  Level of Consciousness: awake, alert , oriented and sedated  Airway & Oxygen Therapy: Patient Spontanous Breathing and Patient connected to nasal cannula oxygen  Post-op Assessment: Report given to RN, Post -op Vital signs reviewed and stable and Patient moving all extremities  Post vital signs: Reviewed and stable  Last Vitals:  Vitals Value Taken Time  BP 148/80 11/09/18 1313  Temp    Pulse    Resp 17 11/09/18 1319  SpO2    Vitals shown include unvalidated device data.  Last Pain:  Vitals:   11/09/18 0951  TempSrc: Oral  PainSc: 0-No pain      Patients Stated Pain Goal: 0 (35/67/01 4103)  Complications: No apparent anesthesia complications

## 2018-11-09 NOTE — Op Note (Signed)
DATE OF OPERATION: 11/09/2018  LOCATION: [facility]  PREOPERATIVE DIAGNOSIS: left leg wound, left heel wound  POSTOPERATIVE DIAGNOSIS: Same  PROCEDURE:  1. Excision of nonviable skin of left heel 2 x 2 cm 2. Excision of nonviable skin of left leg 1 x 2 cm  3. Placement of Acell powder 500 mg and sheet 5 x 5 cm to left heel and left leg  SURGEON: Joscelin Fray Sanger Naama [name], DO  ASSISTANT: Roetta Sessions, PA   EBL: 5 cc  CONDITION: Stable  COMPLICATIONS: None  INDICATION: The patient, [name], is a 68 y.o. female born on 05/24/1950, is here for treatment of left leg and heel wound.   PROCEDURE DETAILS:  The patient was seen prior to surgery and marked.  The IV antibiotics were given. The patient was taken to the operating room and given a general anesthetic. A standard time out was performed and all information was confirmed by those in the room. SCD was placed on right leg.   The left leg was prepped and draped.  The local was injected at the sites for intraoperative hemostasis and postoperative pain control.     Left heel:  The #10 blade was used to excise the heel wound of nonviable skin, soft tissue and fat of the 3 x 3 cm wound.  The Acell powder 400 mg and 3 x 3 cm sheet of the Acell was applied and secured with the 5-0 Vicryl. KY gel and 4 x 4 gauze was applied.   Left leg:  The #10 blade was used to excise the posterior left leg wound of nonviable skin and soft tissue of the 1 x 2 cm wound.  The Acell powder 100 mg and 2 x 2 cm sheet of the Acell was applied and secured with the 5-0 Vicryl. Sorbact was applied and secured with the Vicryl.  KY gel and 4 x 4 gauze was applied. The leg was wrapped with kerlex and an ace wrap.The patient was allowed to wake up and taken to recovery room in stable condition at the end of the case. The family was notified at the end of the case.   The advanced practice practitioner (APP) assisted throughout the case.  The  APP was essential in retraction and counter traction when needed to make the case progress smoothly.  This retraction and assistance made it possible to see the tissue plans for the procedure.  The assistance was needed for blood control, tissue re-approximation and assisted with closure of the incision site.

## 2018-11-09 NOTE — Discharge Instructions (Signed)
Guide to Wound Care  Proper wound care may reduce the risk of infection, improve healing rates, and limit scarring.  This is a general guide to help care for and manage wounds treated with MicroMatrix or Cytal Wound Matrix.   Dressing Changes The frequency of dressing changes can vary based on which product was applied, the size of the wound, or the amount of wound drainage. Dressing inspections are recommended, at least weekly.   If you don't have a Wound VAC, then place KY gel on the wound daily and cover with gauze. This will start on Friday for you.  Dressing Types Primary Dressing:  Non-adherent dressing goes directly over wounds being treated with the powder or sheet (MicroMatrix and/or Cytal).  Secondary Dressing:  Secures the primary dressing in place and provides extra protection, compression, and absorption.  1. Wash Hands - To help decrease the risk of infection, caregivers should wash their hands for a minimum of 20 seconds and may use medical gloves.   2. Remove the Dressings - Avoid removing product from the wound by carefully removing the applicable dressing(s) at the time points recommended above, or as recommended by the treating physician.  Expected Color and Odor:  It is entirely normal for the wound to have an unpleasant odor and to form a caramel-colored gel as the product absorbs into the wound. It is  important to leave this gel on the wound site.  3. Clean the Wound - Use clean water or saline to gently rinse the wound surface and remove any excess discharge that may be present on the wound. Do not wipe off any of the caramel-colored gel on the wound. Protocol for cleaning the wound may vary by facility. If you are unsure what to do, ask the treating physician.  4. Inspect the Wound - Proper wound care requires accurate and clinically relevant wound assessment. Protocols for inspecting the wound may vary by facility, but it is important to  [id number] the length and  width.  What to look out for:  Large or increased amount of drainage   Surrounding skin is red or hot to touch   Increased pain in or around the wound   Flu-like symptoms, fatigue, decreased appetite, fever   Hard, crusty wound surface with black or brown coloring  5. Apply New Dressings - Dressings should cover the entire wound and be suitable for maintaining a moist wound environment. Refer to the back of this guide for more information.  Maintain a Hydrated Wound Area While hydration protocols may vary by facility, it is important to keep the wound area moist throughout the healing process. If the wound appears to be dry during dressing changes, select a dressing that will hydrate the wound and maintain that ideal moist environment. If you are unsure what to do, ask the treating physician.  Remodeling Process Every patient heals differently, and no two cases are the same. The size and location of the wound, product type and layering configurations, and general patient health all contribute to how quickly a wound will heal.  While many factors can influence the rate at which the product absorbs, the following can be used as a general guide.   THINGS TO DO: Refrain from smoking High protein diet and limit carbohydrates and suger Protect the wound from trauma Protect the dressing  Micromatrix powder       Cytal Sheet            Sorbact dressing

## 2018-11-09 NOTE — Progress Notes (Signed)
Orthopedic Tech Progress Note Patient Details:  [name] January 18, 1951 [id number]  Ortho Devices Type of Ortho Device: Crutches Ortho Device/Splint Interventions: Application   Post Interventions Patient Tolerated: Well Instructions Provided: Care of device   [place] Pink 11/09/2018, 3:47 PM

## 2018-11-09 NOTE — Interval H&P Note (Signed)
History and Physical Interval Note:  11/09/2018 11:48 AM  [name]  has presented today for surgery, with the diagnosis of Skin Ulcer Of Left Foot With Fat Layer Exposed.  The various methods of treatment have been discussed with the patient and family. After consideration of risks, benefits and other options for treatment, the patient has consented to  Procedure(s): Excision of left foot wound with ACell placement (Left) as a surgical intervention.  The patient's history has been reviewed, patient examined, no change in status, stable for surgery.  I have reviewed the patient's chart and labs.  Questions were answered to the patient's satisfaction.     Loel Lofty Dillingham

## 2018-11-09 NOTE — Anesthesia Procedure Notes (Signed)
Procedure Name: LMA Insertion Date/Time: 11/09/2018 12:30 PM Performed by: Scheryl Darter, CRNA Pre-anesthesia Checklist: Patient identified, Emergency Drugs available, Suction available and Patient being monitored Patient Re-evaluated:Patient Re-evaluated prior to induction Oxygen Delivery Method: Circle System Utilized Preoxygenation: Pre-oxygenation with 100% oxygen Induction Type: IV induction Ventilation: Mask ventilation without difficulty LMA: LMA inserted LMA Size: 4.0 Number of attempts: 1 Airway Equipment and Method: Bite block Placement Confirmation: positive ETCO2 Tube secured with: Tape Dental Injury: Teeth and Oropharynx as per pre-operative assessment

## 2018-11-10 NOTE — Anesthesia Postprocedure Evaluation (Signed)
Anesthesia Post Note  Patient: [name]  Procedure(s) Performed: Excision of left foot wound with ACell placement (Left Foot)     Patient location during evaluation: PACU Anesthesia Type: General Level of consciousness: sedated Pain management: pain level controlled Vital Signs Assessment: post-procedure vital signs reviewed and stable Respiratory status: spontaneous breathing and respiratory function stable Cardiovascular status: stable Postop Assessment: no apparent nausea or vomiting Anesthetic complications: no    Last Vitals:  Vitals:   11/09/18 1527 11/09/18 1530  BP:    Pulse: (!) 59 60  Resp:    Temp:    SpO2: 100% 98%    Last Pain:  Vitals:   11/09/18 1500  TempSrc:   PainSc: 0-No pain                 [name] DANIEL

## 2018-11-11 ENCOUNTER — Telehealth: Payer: Self-pay

## 2018-11-11 ENCOUNTER — Encounter (HOSPITAL_COMMUNITY): Payer: Self-pay | Admitting: Plastic Surgery

## 2018-11-11 NOTE — Telephone Encounter (Signed)
Received call from Wheeler, patient's husband. He notes concerns regarding Ms. Yunk' wound. He has noticed blood through the ACE bandage but it is not leaking. He would like clarification on how he should be caring for the wound. Jeff's contact number is: [phone].

## 2018-11-14 NOTE — Telephone Encounter (Signed)
Called and spoke with the patient on (11/11/18) regarding the message below.  She stated that she noticed blood on the bandage on the bottom of her (L) heel, and it's the size of a fifty cent piece.  She said she has not changed the bandage today.    Asked the patient if she has noticed any fresh blood on the bandage, and she stated that she has not noticed any fresh blood.  She also stated that she was told to take the dressings on her (L) leg and heel off today, and change the dressings.  She said they were told to put K-Y gel on the wound, but she wanted to know are they to put anything else on the wound.  I first informed the patient that since she's not seeing fresh blood that's good, and the blood that she noticed on the bottom of her heel was normal.  I then informed both the patient and her husband of the wound change instruction from the Op note for the (L) leg and heel.  I instructed them both to remove all of the dressing down to the sorbact (which is green).  Informed them to leave the green in place because it is sewed to her skin, and then I explained to them the purpose of the sorbact and why it was to stay in place.  Then I informed them to place K-Y gel on top of the sorbact,then place gauze on the K-Y, and then they can wrap the area.  The patient and her husband both verbalized understanding and agreed.//AB/CMA

## 2018-11-15 ENCOUNTER — Other Ambulatory Visit: Payer: Self-pay | Admitting: Internal Medicine

## 2018-11-15 DIAGNOSIS — M1990 Unspecified osteoarthritis, unspecified site: Secondary | ICD-10-CM

## 2018-11-15 MED ORDER — MELOXICAM 15 MG PO TABS: 15.0000 mg | ORAL_TABLET | Freq: Every day | ORAL | 0 refills | Status: DC | PRN

## 2018-11-18 ENCOUNTER — Other Ambulatory Visit: Payer: PPO

## 2018-11-18 ENCOUNTER — Other Ambulatory Visit: Payer: Self-pay

## 2018-11-18 ENCOUNTER — Encounter: Payer: PPO | Admitting: Surgical

## 2018-11-18 ENCOUNTER — Other Ambulatory Visit: Payer: Self-pay | Admitting: Physician Assistant

## 2018-11-18 ENCOUNTER — Telehealth: Payer: Self-pay | Admitting: Physician Assistant

## 2018-11-18 DIAGNOSIS — N390 Urinary tract infection, site not specified: Secondary | ICD-10-CM | POA: Diagnosis not present

## 2018-11-18 LAB — URINALYSIS, COMPLETE
Bilirubin, UA: NEGATIVE
Glucose, UA: NEGATIVE
Ketones, UA: NEGATIVE
Nitrite, UA: NEGATIVE
Protein,UA: NEGATIVE
Specific Gravity, UA: 1.015 (ref 1.005–1.030)
Urobilinogen, Ur: 0.2 mg/dL (ref 0.2–1.0)
pH, UA: 7 (ref 5.0–7.5)

## 2018-11-18 LAB — MICROSCOPIC EXAMINATION

## 2018-11-18 MED ORDER — NITROFURANTOIN MONOHYD MACRO 100 MG PO CAPS: 100.0000 mg | ORAL_CAPSULE | Freq: Two times a day (BID) | ORAL | 0 refills | Status: DC

## 2018-11-18 NOTE — Telephone Encounter (Signed)
I just spoke with patient's husband over the phone.  I informed him of the results of her urinalysis, which indicate she does have a UTI at this time.  Prescription for nitrofurantoin 100 mg BID x5 days sent to the pharmacy, I advised her to start treatment as soon as possible.  I explained that I will send her urine for culture and have the results early next week.  I explained that I would call him if the results indicated a necessary change to her therapy.  He expressed an understanding of this plan.

## 2018-11-21 ENCOUNTER — Encounter: Payer: PPO | Admitting: Nurse Practitioner

## 2018-11-21 ENCOUNTER — Telehealth: Payer: Self-pay

## 2018-11-21 ENCOUNTER — Other Ambulatory Visit: Payer: Self-pay | Admitting: Physician Assistant

## 2018-11-21 LAB — CULTURE, URINE COMPREHENSIVE

## 2018-11-21 MED ORDER — NITROFURANTOIN MONOHYD MACRO 100 MG PO CAPS: 100.0000 mg | ORAL_CAPSULE | Freq: Two times a day (BID) | ORAL | 0 refills | Status: AC

## 2018-11-21 NOTE — Telephone Encounter (Signed)
Called patient to confirm appointment scheduled for tomorrow. Patient answered the following questions: 1. To the best of your knowledge, have you been in close contact with any one with a confirmed diagnosis of COVID-19? No 2. Have you had any one or more of the following; fever, chills, cough, shortness of breath, or any flu-like symptoms? No 3. Have you been diagnosed with or have a previous diagnosis of COVID 19? No 4. I am going to go over a few other symptoms with you. Please let me know if you are experiencing any of the following: None of the below a. Ear, nose, or throat discomfort b. A sore throat c. Headache d. Muscle pain e. Diarrhea f. Loss of taste or smell   

## 2018-11-21 NOTE — Progress Notes (Signed)
Urine culture returned with likely ESBL-producing E. coli.  She is allergic to penicillins with a history of anaphylactoid reactions.  E. coli is susceptible to IM or IV formulation antibiotics only.  Upon discussion with Dr. Matilde Sprang, I have prolonged her treatment with PO nitrofurantoin from 5 to 10 days.  Orders placed.  I called the patient and advised her to extend her treatment accordingly and contact our office at the end of her 10 days of continuous treatment if her symptoms do not resolve.  If she continues to be symptomatic at that point, I would like her to return to the office and provide a follow-up urine sample for culture to prove cure.  She expressed an understanding of this plan.

## 2018-11-22 ENCOUNTER — Other Ambulatory Visit: Payer: Self-pay

## 2018-11-22 ENCOUNTER — Encounter: Payer: Self-pay | Admitting: Nurse Practitioner

## 2018-11-22 ENCOUNTER — Ambulatory Visit (INDEPENDENT_AMBULATORY_CARE_PROVIDER_SITE_OTHER): Payer: PPO | Admitting: Nurse Practitioner

## 2018-11-22 VITALS — BP 154/94 | HR 74 | Temp 97.8°F | Ht 66.0 in

## 2018-11-22 DIAGNOSIS — L97522 Non-pressure chronic ulcer of other part of left foot with fat layer exposed: Secondary | ICD-10-CM

## 2018-11-22 NOTE — Progress Notes (Signed)
Patient ID: [name], female    DOB: [birth year], 68 y.o.   MRN: [id number]   C.C.: follow up leg and foot wound  [name] is a 68 yo female who presents for post-op follow up after undergoing excision of left leg wound and left foot wound with Acell placement on 11/09/18. She has been applying KY gel and gauze to both wounds. She denies having any pain and is beginning to walk more.   Review of Systems  Constitutional: Positive for activity change.  HENT: Negative.   Respiratory: Negative.   Cardiovascular: Negative.   Gastrointestinal: Negative.   Genitourinary: Negative.   Musculoskeletal: Negative.   Skin: Positive for wound.  Neurological: Negative.     Past Medical History:  Diagnosis Date  . Abnormal antibody titer   . Anxiety and depression   . Arthritis   . Asthma   . Cystocele   . Depression   . Diabetes ([place])   . DVT (deep venous thrombosis) (H[facility])    Righ calf  . Dyspnea    11/08/2018 - "more now due to lack of exercise"  . Dysrhythmia    afib  . Edema   . GERD (gastroesophageal reflux disease)   . Heart murmur    Echocardiogram June 2015: Mild MR (possible vegetation seen on TTE, not seen on TEE), normal LV size with moderate concentric LVH. Normal function EF 60-65%. Normal diastolic function. Mild LA dilation.  Marland Kitchen History of IBS   . History of kidney stones   . History of methicillin resistant staphylococcus aureus (MRSA) 2008  . Hyperlipidemia   . Hypertension   . Hypothyroidism   . Insomnia   . Kidney stone    kidney stones with lithotripsy .  [facility]- "adrenal glands"  . Neuropathy involving both lower extremities   . Obesity, Class II, BMI 35-39.9, with comorbidity   . Paroxysmal atrial fibrillation ([place]) 2007   In June 2015, Cardiac Event Monitor: Mostly SR/sinus arrhythmia with PVCs that are frequent. Short bursts of A. fib lasting several minutes;; CHA2DS2-VASc Score = 5 (age, Female, PVD, DM, HTN)  . Peripheral vascular  disease ([place])   . Sleep apnea    use C-PAP  . Urinary incontinence   . Venous stasis dermatitis of both lower extremities     Past Surgical History:  Procedure Laterality Date  . ANKLE SURGERY Right   . APPLICATION OF WOUND VAC Left 06/27/2015   Procedure: APPLICATION OF WOUND VAC ( POSSIBLE ) ;  Surgeon: [name], MD;  Location: [facility] ORS;  Service: Vascular;  Laterality: Left;  . APPLICATION OF WOUND VAC Left 09/25/2016   Procedure: APPLICATION OF WOUND VAC;  Surgeon: [name], DPM;  Location: [facility];  Service: Podiatry;  Laterality: Left;  . arm surgery     right    . CHOLECYSTECTOMY    . COLONOSCOPY    . COLONOSCOPY WITH PROPOFOL N/A 01/11/2017   Procedure: COLONOSCOPY WITH PROPOFOL;  Surgeon: [name], MD;  Location: [facility] ENDOSCOPY;  Service: Endoscopy;  Laterality: N/A;  . COLONOSCOPY WITH PROPOFOL N/A 02/22/2017   Procedure: COLONOSCOPY WITH PROPOFOL;  Surgeon: [name], MD;  [facility];  Service: Endoscopy;  Laterality: N/A;  . COLONOSCOPY WITH PROPOFOL N/A 05/31/2017   Procedure: COLONOSCOPY WITH PROPOFOL;  Surgeon: [name], MD;  Location: [facility] ENDOSCOPY;  Service: Endoscopy;  Laterality: N/A;  . ESOPHAGOGASTRODUODENOSCOPY (EGD) WITH PROPOFOL N/A 01/11/2017   Procedure: ESOPHAGOGASTRODUODENOSCOPY (EGD) WITH PROPOFOL;  Surgeon: [name], MD;  Location: [facility] ENDOSCOPY;  Service: Endoscopy;  Laterality: N/A;  . ESOPHAGOGASTRODUODENOSCOPY (EGD) WITH PROPOFOL N/A 10/25/2018   Procedure: ESOPHAGOGASTRODUODENOSCOPY (EGD) WITH PROPOFOL;  Surgeon: [name], MD;  Location: [facility] ENDOSCOPY;  Service: Endoscopy;  Laterality: N/A;  . HERNIA REPAIR     umbilical  . HIATAL HERNIA REPAIR    . I&D EXTREMITY Left 06/27/2015   Procedure: IRRIGATION AND DEBRIDEMENT EXTREMITY            ( CALF HEMATOMA ) POSSIBLE WOUND VAC;  Surgeon: [name], MD;  Location: [facility] ORS;  Service: Vascular;  Laterality: Left;  . INCISION AND  DRAINAGE OF WOUND Left 09/25/2016   Procedure: IRRIGATION AND DEBRIDEMENT - PARTIAL RESECTION OF ACHILLES TENDON WITH WOUND VAC APPLICATION;  Surgeon: [name], DPM;  Location: [facility];  Service: Podiatry;  Laterality: Left;  . INCISION AND DRAINAGE OF WOUND Left 11/09/2018   Procedure: Excision of left foot wound with ACell placement;  Surgeon: Wallace Going, DO;  Location: [place];  Service: Plastics;  Laterality: Left;  . IRRIGATION AND DEBRIDEMENT ABSCESS Left 09/07/2016   Procedure: IRRIGATION AND DEBRIDEMENT ABSCESS LEFT HEEL;  Surgeon: [name], DPM;  Location: [facility];  Service: Podiatry;  Laterality: Left;  . JOINT REPLACEMENT  2013   left knee replacement  . KIDNEY SURGERY Right    kidney stones  . LITHOTRIPSY    . LOWER EXTREMITY ANGIOGRAPHY Left 04/04/2018   Procedure: LOWER EXTREMITY ANGIOGRAPHY;  Surgeon: [name], MD;  Location: [place] [facility];  Service: Cardiovascular;  Laterality: Left;  . NM GATED MYOVIEW ([place] [facility])  February 2017   Likely breast attenuation. LOW RISK study. Normal EF 55-60%.  . OTHER SURGICAL HISTORY     08/2016 or 09/2016 surgery on achilles tenso h/o staph infection heal. Dr. [name]  . removal of left hematoma Left    leg  . REVERSE SHOULDER ARTHROPLASTY Right 10/08/2015   Procedure: REVERSE SHOULDER ARTHROPLASTY;  Surgeon: [name], MD;  Location: [facility] ORS;  Service: Orthopedics;  Laterality: Right;  . SLEEVE GASTROPLASTY    . TONSILLECTOMY    . TOTAL KNEE ARTHROPLASTY Left   . TOTAL SHOULDER REPLACEMENT Right   . TRANSESOPHAGEAL ECHOCARDIOGRAM  08/08/2013   Mild LVH, EF 60-65%. Moderate LA dilation and mild RA dilation. Mild MR with no evidence of stenosis and no evidence of endocarditis. A false chordae is noted.  . TRANSTHORACIC ECHOCARDIOGRAM  08/03/2013   Mild-Moderate concentric LVH, EF 60-65%. Normal diastolic function. Mild LA dilation. Mild MR with possible vegetation  - not confirmed on TEE   . ULNAR NERVE  TRANSPOSITION Right 08/06/2016   Procedure: ULNAR NERVE DECOMPRESSION/TRANSPOSITION;  Surgeon: [name], MD;  Location: [facility] ORS;  Service: Orthopedics;  Laterality: Right;  . UPPER GI ENDOSCOPY    . VAGINAL HYSTERECTOMY        Current Outpatient Medications:  .  albuterol (PROVENTIL HFA;VENTOLIN HFA) 108 (90 Base) MCG/ACT inhaler, Inhale 1-2 puffs into the lungs every 4 (four) hours as needed for wheezing or shortness of breath. (Patient taking differently: Inhale 2 puffs into the lungs every 4 (four) hours as needed for wheezing or shortness of breath. ), Disp: 8 g, Rfl: 2 .  albuterol (PROVENTIL) (2.5 MG/3ML) 0.083% nebulizer solution, Take 3 mLs (2.5 mg total) by nebulization every 6 (six) hours as needed for wheezing or shortness of breath., Disp: 150 mL, Rfl: 1 .  ALPRAZolam (XANAX) 0.5 MG tablet, Take  0.5 mg by mouth 2 (two) times daily as needed for anxiety., Disp: , Rfl:  .  amLODipine (NORVASC) 5 MG tablet, Take 1 tablet (5 mg total) by mouth daily., Disp: 90 tablet, Rfl: 3 .  buPROPion (WELLBUTRIN XL) 150 MG 24 hr tablet, Take 1 tablet (150 mg total) by mouth daily., Disp: 90 tablet, Rfl: 3 .  ciprofloxacin (CIPRO) 250 MG tablet, Take 1 tablet (250 mg total) by mouth 2 (two) times daily., Disp: 14 tablet, Rfl: 0 .  clobetasol cream (TEMOVATE) [id number] %, Apply 1 application topically 2 (two) times daily. To arms (Patient taking differently: Apply 1 application topically 2 (two) times daily as needed (irritation). To arms), Disp: 60 g, Rfl: 1 .  dabigatran (PRADAXA) 150 MG CAPS capsule, Take 1 capsule (150 mg total) by mouth 2 (two) times daily., Disp: 180 capsule, Rfl: 1 .  Dexlansoprazole (DEXILANT) 30 MG capsule, Take 1 capsule (30 mg total) by mouth daily. Note lower maintenance dose take 30 min before food lunch or dinner not with thyroid medication, Disp: 90 capsule, Rfl: 3 .  dexlansoprazole (DEXILANT) 60 MG capsule, Take 60 mg by mouth daily before breakfast., Disp: , Rfl:  .   DULoxetine (CYMBALTA) 60 MG capsule, Take 1 capsule (60 mg total) by mouth daily. (Patient taking differently: Take 60 mg by mouth daily with supper. ), Disp: 90 capsule, Rfl: 0 .  estradiol (ESTRACE) 0.1 MG/GM vaginal cream, Apply 0.5mg  (pea-sized amount)  just inside the vaginal introitus with a finger-tip on Monday, Wednesday and Friday nights, (Patient taking differently: Place 1 Applicatorful vaginally every Monday, Wednesday, and Friday. Apply 0.5mg  (pea-sized amount)  just inside the vaginal introitus with a finger-tip on Monday, Wednesday and Friday nights,), Disp: 42.5 g, Rfl: 11 .  fesoterodine (TOVIAZ) 8 MG TB24 tablet, Take 1 tablet (8 mg total) by mouth daily., Disp: 90 tablet, Rfl: 3 .  fluconazole (DIFLUCAN) 150 MG tablet, Take 150 mg by mouth every three (3) days as needed (for yeast (especially after antibiotic use))., Disp: , Rfl:  .  fluticasone-salmeterol (ADVAIR HFA) 115-21 MCG/ACT inhaler, Inhale 2 puffs into the lungs daily. Rinse mouth thoroughly after use, Disp: 1 Inhaler, Rfl: 12 .  hydrochlorothiazide (HYDRODIURIL) 12.5 MG tablet, Take 1 tablet (12.5 mg total) by mouth daily. In am, Disp: 90 tablet, Rfl: 3 .  HYDROcodone-acetaminophen (NORCO/VICODIN) 5-325 MG tablet, Take 1 tablet by mouth every 8 (eight) hours as needed for up to 30 days for severe pain. Must last 30 days, Disp: 90 tablet, Rfl: 0 .  levocetirizine (XYZAL) 5 MG tablet, Take 1 tablet (5 mg total) by mouth every evening., Disp: 90 tablet, Rfl: 3 .  levothyroxine (SYNTHROID, LEVOTHROID) 175 MCG tablet, Take 1 tablet (175 mcg total) by mouth daily before breakfast., Disp: 90 tablet, Rfl: 3 .  losartan (COZAAR) 50 MG tablet, Take 1 tablet (50 mg total) by mouth daily. Do not take quinapril 40, Disp: 90 tablet, Rfl: 3 .  meloxicam (MOBIC) 15 MG tablet, Take 1 tablet (15 mg total) by mouth daily as needed for pain., Disp: 90 tablet, Rfl: 0 .  metFORMIN (GLUCOPHAGE) 1000 MG tablet, Take 1 tablet (1,000 mg total) by  mouth 2 (two) times daily. With food (Patient taking differently: Take 500-1,000 mg by mouth See admin instructions. Take 1 tablet (1000 mg) by mouth in the morning & 0.5 tablet (500 mg) by mouth at night With food), Disp: 180 tablet, Rfl: 3 .  metoprolol succinate (TOPROL-XL) 50 MG 24 hr tablet,  Take 50 mg by mouth daily after supper. Take with or immediately following a meal., Disp: , Rfl:  .  montelukast (SINGULAIR) 10 MG tablet, Take 1 tablet (10 mg total) by mouth daily. (Patient taking differently: Take 10 mg by mouth at bedtime. ), Disp: 90 tablet, Rfl: 3 .  mupirocin ointment (BACTROBAN) 2 %, Apply 1 application topically 2 (two) times daily. Skin wounds (Patient taking differently: Apply 1 application topically 2 (two) times daily as needed (skin wounds). ), Disp: 30 g, Rfl: 0 .  nitrofurantoin, macrocrystal-monohydrate, (MACROBID) 100 MG capsule, Take 1 capsule (100 mg total) by mouth every 12 (twelve) hours for 5 days., Disp: 10 capsule, Rfl: 0 .  nystatin cream (MYCOSTATIN), Apply 1 application topically 2 (two) times daily. Mouth prn for 7 days as needed (Patient taking differently: Apply 1 application topically 2 (two) times daily as needed for dry skin. Mouth prn for 7 days as needed), Disp: 30 g, Rfl: 11 .  ondansetron (ZOFRAN) 4 MG tablet, Take 1 tablet (4 mg total) by mouth every 8 (eight) hours as needed for nausea or vomiting., Disp: 30 tablet, Rfl: 0 .  oxybutynin (DITROPAN XL) 15 MG 24 hr tablet, TAKE ONE TABLET BY MOUTH AT BEDTIME (Patient taking differently: Take 15 mg by mouth at bedtime. ), Disp: 90 tablet, Rfl: 3 .  phenazopyridine (PYRIDIUM) 200 MG tablet, Take 1 tablet (200 mg total) by mouth 3 (three) times daily as needed for pain., Disp: 20 tablet, Rfl: 0 .  pregabalin (LYRICA) 25 MG capsule, Take 1 capsule (25 mg total) by mouth 2 (two) times daily., Disp: 60 capsule, Rfl: 2 .  sucralfate (CARAFATE) 1 GM/10ML suspension, Take 1 g by mouth 2 (two) times daily before a meal.,  Disp: , Rfl:  .  zinc oxide 20 % ointment, Apply 1 application topically as needed for irritation., Disp: 56.7 g, Rfl: 0 .  HYDROcodone-acetaminophen (NORCO/VICODIN) 5-325 MG tablet, Take 1 tablet by mouth every 8 (eight) hours as needed for up to 30 days for severe pain. Must last 30 days (Patient not taking: Reported on 11/03/2018), Disp: 90 tablet, Rfl: 0 .  HYDROcodone-acetaminophen (NORCO/VICODIN) 5-325 MG tablet, Take 1 tablet by mouth every 8 (eight) hours as needed for up to 30 days for severe pain. Must last 30 days, Disp: 90 tablet, Rfl: 0  Current Facility-Administered Medications:  .  ipratropium-albuterol (DUONEB) 0.5-2.5 (3) MG/3ML nebulizer solution 3 mL, 3 mL, Nebulization, Q6H, [name], [name], MD   Objective:   Vitals:   11/22/18 1349  BP: (!) 154/94  Pulse: 74  Temp: 97.8 F (36.6 C)  SpO2: 98%    Physical Exam  General: alert, calm, no acute distress HEENT: atraumatic Neck: supple, full ROM Chest: symmetrical rise and fall Lungs: unlabored breathing Cardiac: +2 bilateral radial pulse Musculoskeletal: MAEx4 Neuro: A&O x3, calm, cooperative, steady gait with walker Skin: 2 cm x 2 cm circular open wound on left heel with granulating wound bed, 1 cm x 2 cm dry wound on left heel   Assessment & Plan:  Skin ulcer of left foot with fat layer exposed ([place])  [name] is a 68 yo female s/p excision of left leg wound and left foot wound with Acell placement on 11/09/18. The left heel wound has incorporated the Acell nicely. An application of donated Acell powder, adaptic, KY gel, and gauze were applied today. The leg wound was quite dry. KY gel, adaptic, and gauze were applied to the leg wound. The leg and  foot were wrapped in kerlex and lightly wrapped coban. Patient will replace gauze and KY gel to both wounds on 9/3 and will continue daily dressing changes from that point on. She should not get the wounds wet for 1 week. Return in 2 weeks.   Picture placed  in chart with patient's consent.   Alfredo Batty, NP

## 2018-11-23 IMAGING — CR DG CHEST 2V
2 series · 2 of 2 positions shown · non-contrast
Comparison: 08/06/2016

CLINICAL DATA: generalized weakness, right arm pain, and left
leg/ankle pain and redness. Pt states that her symptoms started last
night. SOB x 1 mo. Pt states she has ulnar surgery to right arm on
08/06/2016 and has been SOB since.

EXAM:
CHEST  2 VIEW

[chest pa]
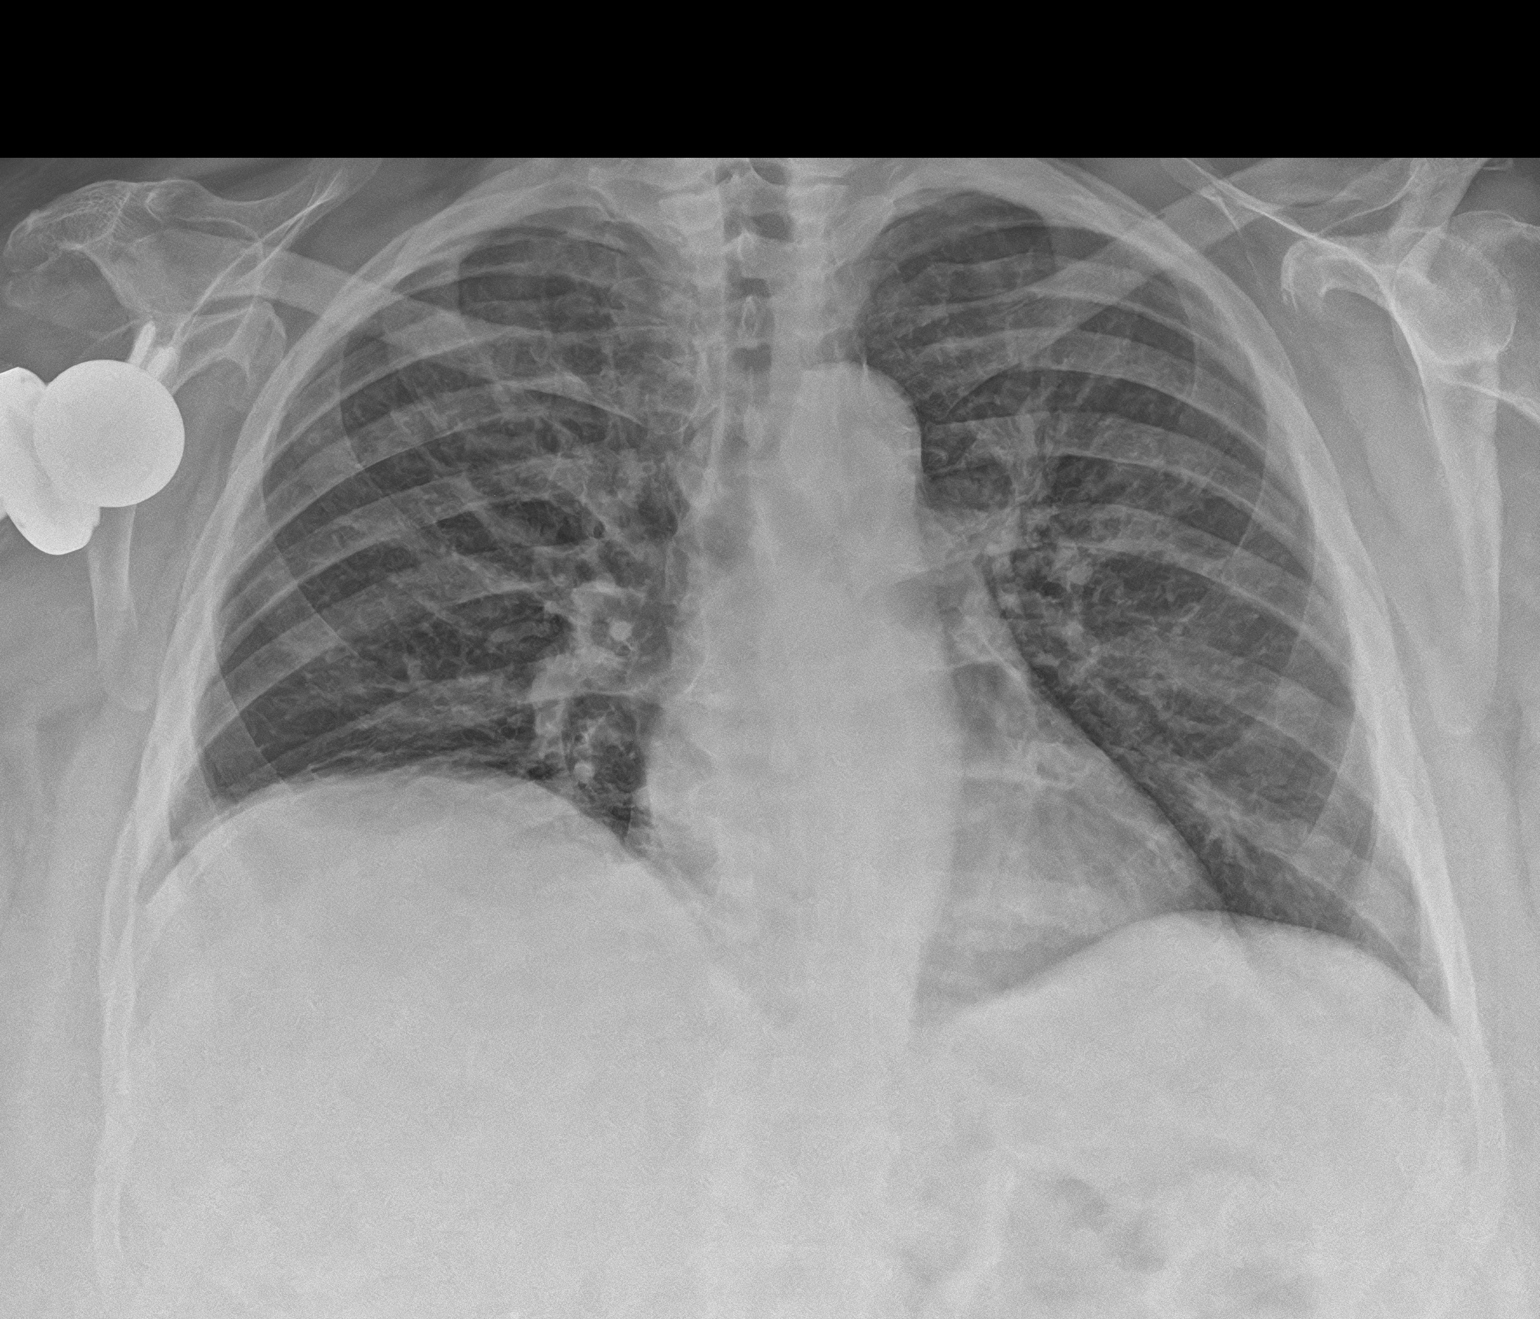

[chest lat]
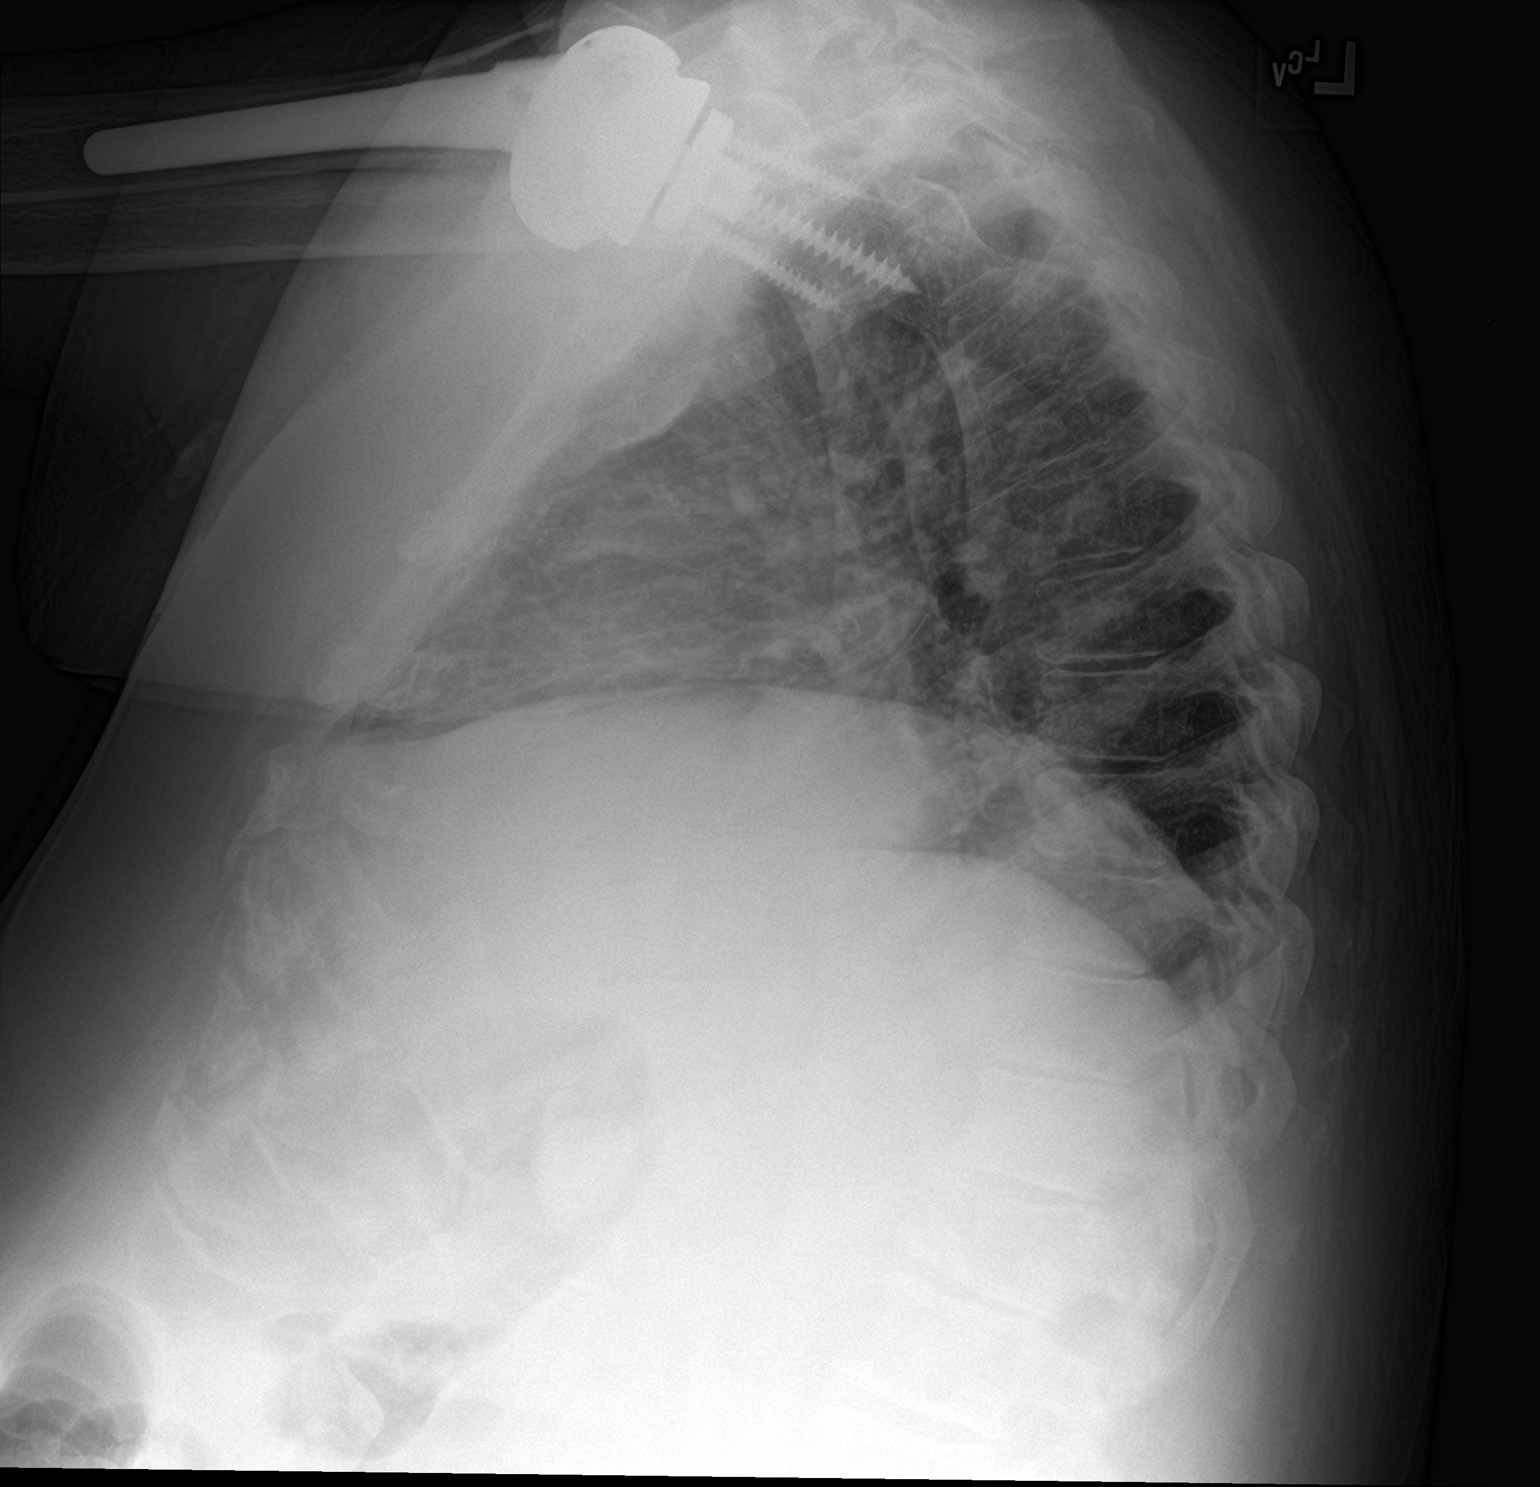

[2 of 2 positions shown; findings below may reference images not displayed]

FINDINGS: Heart size is normal. Shallow lung inflation. Elevation of the right
hemidiaphragm is stable. There is mild perihilar peribronchial
thickening. There are no focal consolidations or pleural effusions.
No pulmonary edema. Status post right reverse shoulder arthroplasty.
IMPRESSION: 1. Bronchitic changes.
2.  No evidence for acute  abnormality.

## 2018-11-24 IMAGING — MR MR ANKLE*L* WO/W CM
8 of 10 series · 33 of 40 positions shown · IV contrast (multihance)
Comparison: None.

CLINICAL DATA: Pain, redness and swelling involving the left ankle
area since [REDACTED].

EXAM:
MRI OF THE LEFT ANKLE WITHOUT AND WITH CONTRAST
TECHNIQUE: Multiplanar, multisequence MR imaging of the ankle was performed
before and after the administration of intravenous contrast.
CONTRAST:  20mL MULTIHANCE GADOBENATE DIMEGLUMINE 529 MG/ML IV SOLN

[Series 3: T2 fat-sat · axial · 3.0mm · 0.35mm/px · z∈[-59,+120]mm · 4 of 51 slices shown]
[im 1/51]
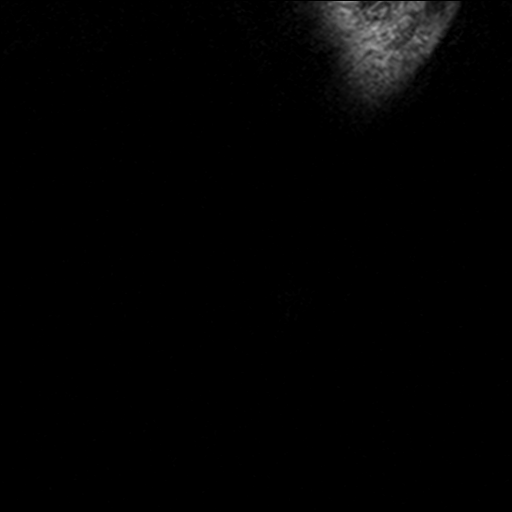
[im 17/51]
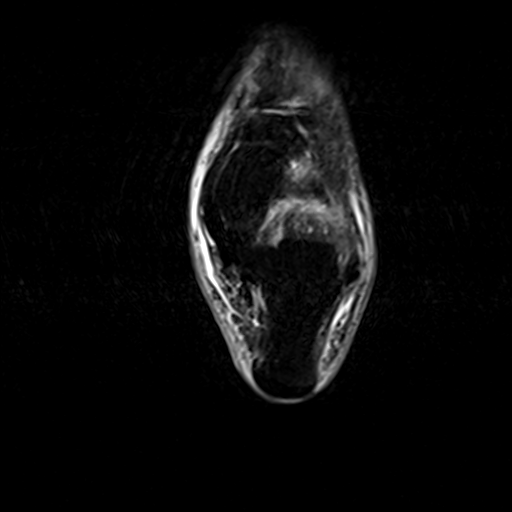
[im 34/51]
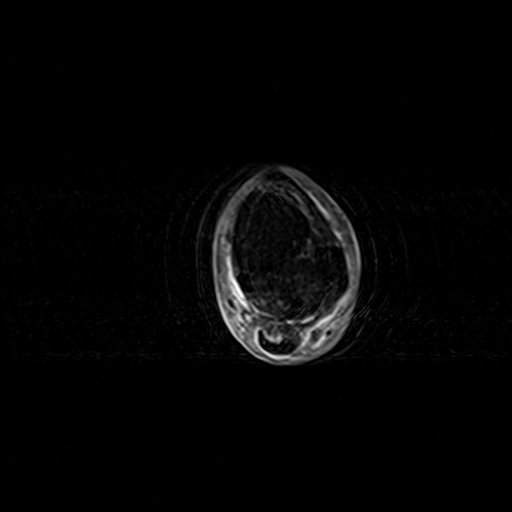
[im 51/51]
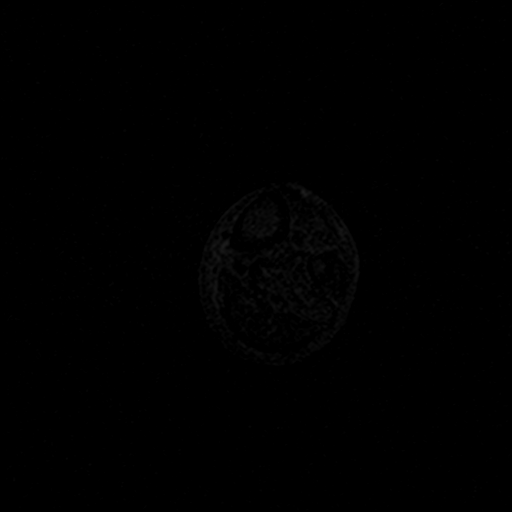

[Series 4: T1 · axial · 3.0mm · 0.56mm/px · z∈[-60,+120]mm · 4 of 51 slices shown (1 of 2)]
[im 1/51]
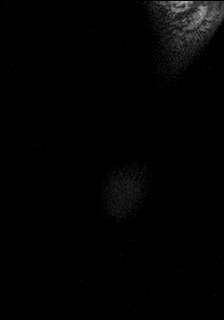
[im 17/51]
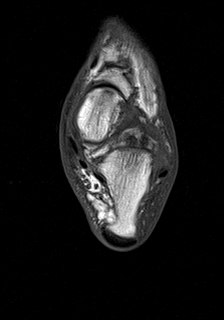
[im 34/51]
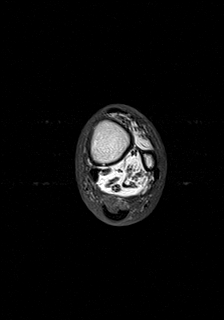
[im 51/51]
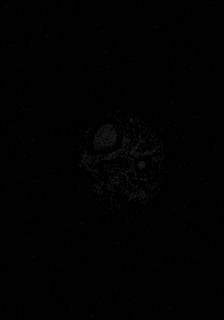

[Series 5: T1 fat-sat · axial · 3.0mm · 0.56mm/px · z∈[-60,+120]mm · 5 of 51 slices shown (1 of 4)]
[im 1/51]
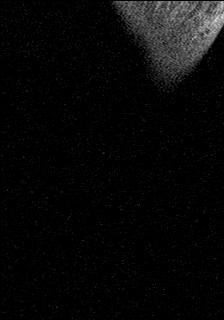
[im 13/51]
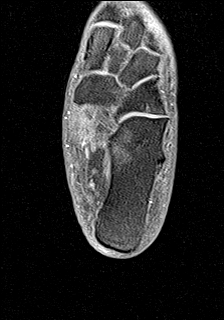
[im 26/51]
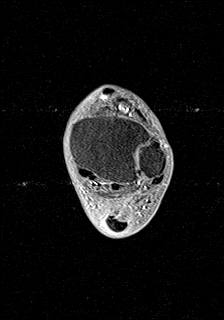
[im 38/51]
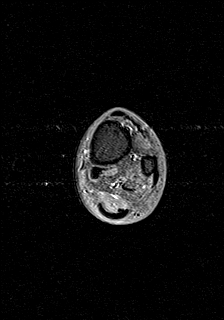
[im 51/51]
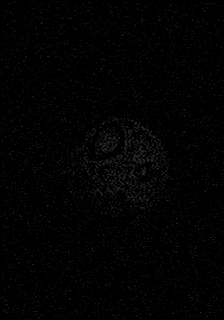

[Series 6: T1 · coronal · 3.0mm · 0.35mm/px · 4 of 39 slices shown (2 of 2)]
[im 1/39]
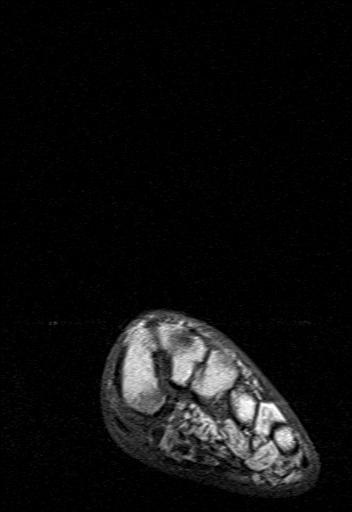
[im 13/39]
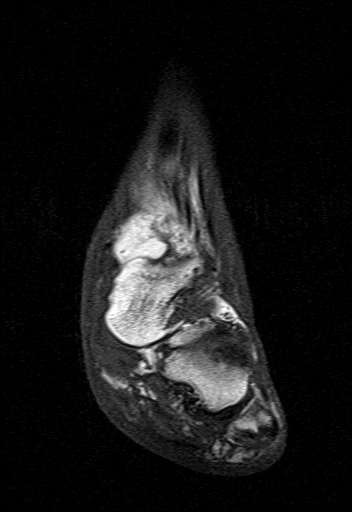
[im 26/39]
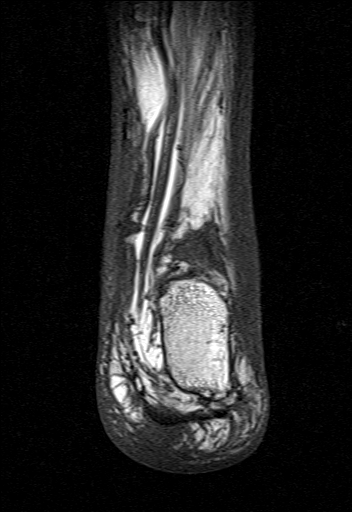
[im 39/39]
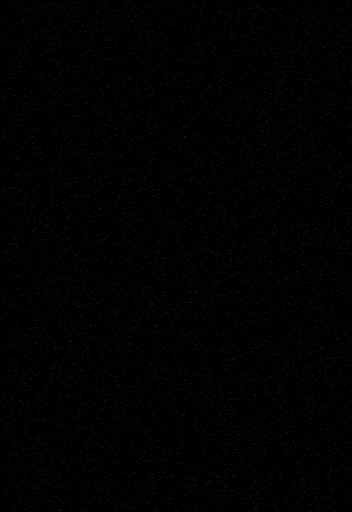

[Series 7: STIR · coronal · 3.0mm · 0.35mm/px · 4 of 39 slices shown]
[im 1/39]
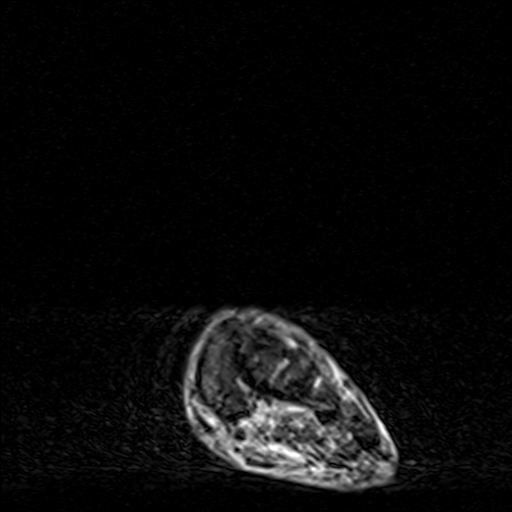
[im 13/39]
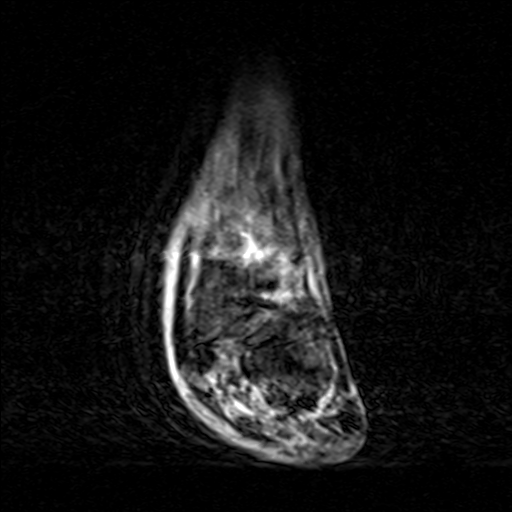
[im 26/39]
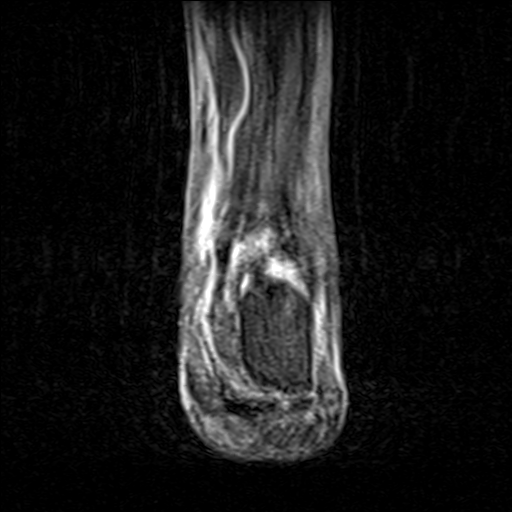
[im 39/39]
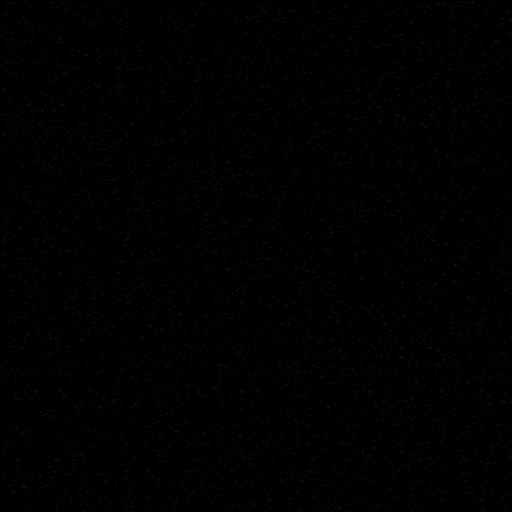

[Series 10: T1 fat-sat · axial · 3.0mm · 0.56mm/px · z∈[-60,+120]mm · 5 of 51 slices shown (2 of 4)]
[im 1/51]
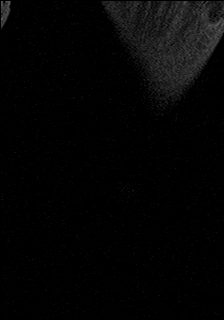
[im 13/51]
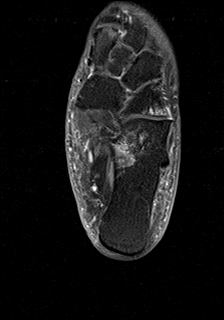
[im 26/51]
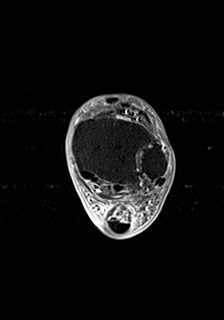
[im 38/51]
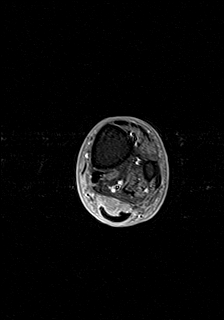
[im 51/51]
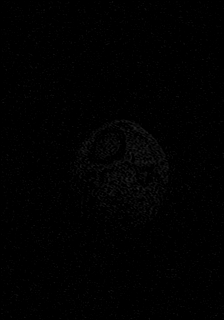

[Series 11: T1 fat-sat · coronal · 3.0mm · 0.35mm/px · 4 of 39 slices shown (3 of 4)]
[im 1/39]
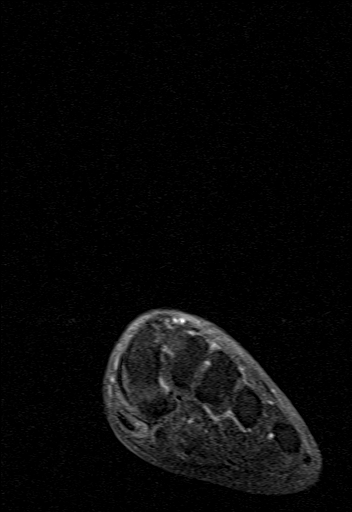
[im 13/39]
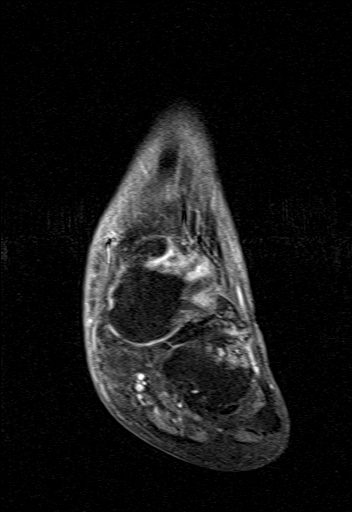
[im 26/39]
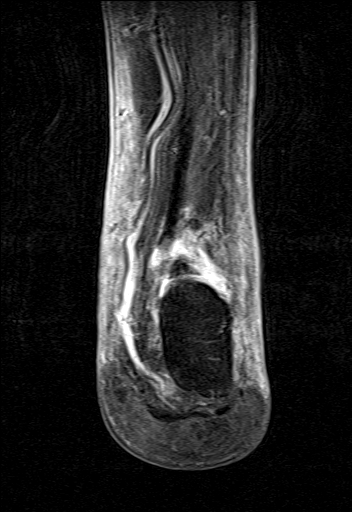
[im 39/39]
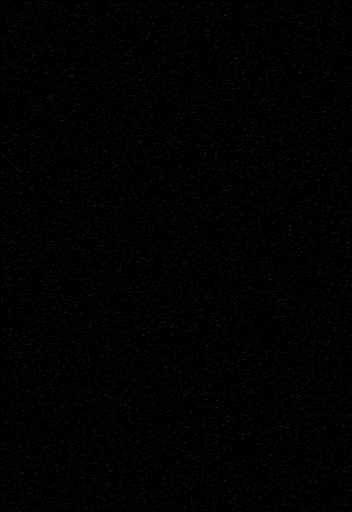

[Series 12: T1 fat-sat · sagittal · 3.0mm · 0.70mm/px · 3 of 26 slices shown (4 of 4)]
[im 1/26]
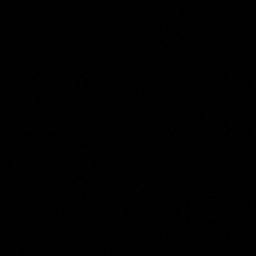
[im 13/26]
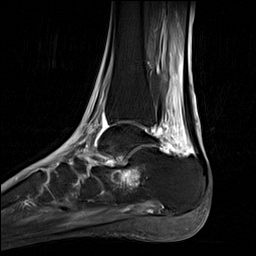
[im 26/26]
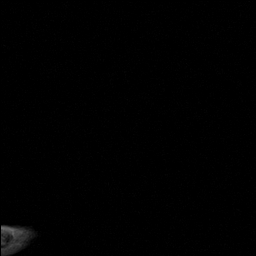

[33 of 40 positions shown; findings below may reference images not displayed]

FINDINGS: Examination is limited by patient motion.

TENDONS

Peroneal: Intact.

Posteromedial: Intact.

Anterior: Intact.

Achilles: Mild tendinopathy but no tear.

Plantar Fascia: Intact.

LIGAMENTS

Lateral: Intact

Medial: Intact

CARTILAGE

Ankle Joint: Mild degenerative changes but no full-thickness
chondral defect or osteochondral lesion. Small joint effusion.

Subtalar Joints/Sinus Tarsi: Moderate subtalar joint degenerative
changes with subchondral cysts. There is also invagination of
vascular tissue into the calcaneus along the posterior margin of the
sinus tarsi E. there is a small amount of fluid and edema in the
sinus tarsi E but the cervical and interosseous ligaments are
grossly intact.

Bones: No stress fracture or AVN. Moderate midfoot degenerative
changes. No findings to suggest septic arthritis or osteomyelitis.

Other: Subcutaneous soft tissue swelling/ edema suggesting
cellulitis. Diffuse fatty atrophy of the foot and ankle musculature
but no findings for myofasciitis or pyomyositis.
IMPRESSION: IMPRESSION
1. Diffuse subcutaneous soft tissue swelling/edema/fluid suggesting
cellulitis. No findings for focal soft tissue abscess, pyomyositis,
septic arthritis or osteomyelitis.
2. Midfoot and hindfoot degenerative changes and mild ankle joint
degenerative changes. No stress fracture or AVN.
3. Intact medial and lateral ankle ligaments and tendons.

## 2018-11-30 ENCOUNTER — Ambulatory Visit: Payer: PPO | Admitting: Pain Medicine

## 2018-12-01 ENCOUNTER — Encounter: Payer: Self-pay | Admitting: Pain Medicine

## 2018-12-04 NOTE — Progress Notes (Signed)
Pain Management Virtual Encounter Note - Virtual Visit via Telephone Telehealth (real-time audio visits between healthcare provider and patient).   Patient's Phone No. & Preferred Pharmacy:  [phone] (home); [phone] (mobile); (Preferred) [phone] [email or web address]  [place], [place] - [place] [address] Phone: [phone] Fax: [phone]    Pre-screening note:  Our staff contacted [name] and offered her an "in person", "face-to-face" appointment versus a telephone encounter. She indicated preferring the telephone encounter, at this time.   Reason for Virtual Visit: COVID-19*  Social distancing based on CDC and AMA recommendations.   I contacted [name] on 12/05/2018 via telephone.      I clearly identified myself as Gaspar Cola, MD. I verified that I was speaking with the correct person using two identifiers (Name: [name], and date of birth: 1950/04/26).  Advanced Informed Consent I sought verbal advanced consent from [name] for virtual visit interactions. I informed [name] of possible security and privacy concerns, risks, and limitations associated with providing "not-in-person" medical evaluation and management services. I also informed [name] of the availability of "in-person" appointments. Finally, I informed her that there would be a charge for the virtual visit and that she could be  personally, fully or partially, financially responsible for it. [name] expressed understanding and agreed to proceed.   Historic Elements   [name] is a 68 y.o. year old, female patient evaluated today after her last encounter by our practice on 08/31/2018. [name]  has a past medical history of Abnormal antibody titer, Anxiety and depression, Arthritis, Asthma, Cystocele, Depression, Diabetes ([place]), DVT (deep venous thrombosis) ([place]), Dyspnea, Dysrhythmia, Edema,  GERD (gastroesophageal reflux disease), Heart murmur, History of IBS, History of kidney stones, History of methicillin resistant staphylococcus aureus (MRSA) (2008), Hyperlipidemia, Hypertension, Hypothyroidism, Insomnia, Kidney stone, Neuropathy involving both lower extremities, Obesity, Class II, BMI 35-39.9, with comorbidity, Paroxysmal atrial fibrillation ([place]) (2007), Peripheral vascular disease ([place]), Sleep apnea, Urinary incontinence, and Venous stasis dermatitis of both lower extremities. She also  has a past surgical history that includes Ankle surgery (Right); Vaginal hysterectomy; Sleeve Gastroplasty; Lithotripsy; Kidney surgery (Right); transthoracic echocardiogram (08/03/2013); transesophageal echocardiogram (08/08/2013); Tonsillectomy; Total knee arthroplasty (Left); Hiatal hernia repair; Cholecystectomy; I&D extremity (Left, 06/27/2015); Application if wound vac (Left, 06/27/2015); removal of left hematoma (Left); NM GATED MYOVIEW [facility] HX) (February 2017); Reverse shoulder arthroplasty (Right, 10/08/2015); Hernia repair; Ulnar nerve transposition (Right, 08/06/2016); arm surgery; Irrigation and debridement abscess (Left, 09/07/2016); Incision and drainage of wound (Left, 09/25/2016); Application if wound vac (Left, 09/25/2016); Colonoscopy; Upper gi endoscopy; Esophagogastroduodenoscopy (egd) with propofol (N/A, 01/11/2017); Colonoscopy with propofol (N/A, 01/11/2017); Colonoscopy with propofol (N/A, 02/22/2017); OTHER SURGICAL HISTORY; Total shoulder replacement (Right); Joint replacement (2013); Colonoscopy with propofol (N/A, 05/31/2017); Lower Extremity Angiography (Left, 04/04/2018); Esophagogastroduodenoscopy (egd) with propofol (N/A, 10/25/2018); and Incision and drainage of wound (Left, 11/09/2018). [name] has a current medication list which includes the following prescription(s): albuterol, albuterol, alprazolam, amlodipine, bupropion, clobetasol cream, dabigatran, dexlansoprazole, dexilant, duloxetine,  estradiol, fesoterodine, fluticasone-salmeterol, hydrochlorothiazide, levocetirizine, levothyroxine, losartan, meloxicam, metformin, metoprolol succinate, montelukast, mupirocin ointment, nystatin cream, ondansetron, oxybutynin, phenazopyridine, pregabalin, sucralfate, zinc oxide, hydrocodone-acetaminophen, hydrocodone-acetaminophen, and hydrocodone-acetaminophen, and the following Facility-Administered Medications: ipratropium-albuterol. She  reports that she has never smoked. She has never used smokeless tobacco. She reports that she does not drink alcohol or use drugs. [name] is allergic to penicillins; aspirin; morphine and related; and terramycin [oxytetracycline].  HPI  Today, she is being contacted for medication management.  The patient indicates doing well with the current medication regimen. No adverse reactions or side effects reported to the medications.  The patient indicates recently having had some plastic surgery for her foot and she believes that going up on the Lyrica may help with her pain.  In fact, she has been taking the Lyrica 25 mg 1 tablet p.o. 3 times daily instead of twice daily as we had written it for her.  Today we talked about how she is doing with the medication and we decided to go up to the 50 mg Lyrica and to have her take it initially twice daily but with instructions to increase it to 3 times daily as tolerated.  The patient was reminded that sometimes it may take up to 3 weeks for adequate levels to be reached in her body.  She understood and accepted.  Pharmacotherapy Assessment  Analgesic: Hydrocodone/APAP 5/325 mg, 1 tab PO q 8 hours (15 mg/day of hydrocodone) MME/day:15 mg/day.   Monitoring: Pharmacotherapy: No side-effects or adverse reactions reported. [place] PMP: PDMP reviewed during this encounter.       Compliance: No problems identified. Effectiveness: Clinically acceptable. Plan: Refer to "POC".  UDS:  Summary  Date Value Ref Range Status   05/19/2018 FINAL  Final    Comment:    ==================================================================== TOXASSURE SELECT 13 (MW) ==================================================================== Test                             Result       Flag       Units Drug Present and Declared for Prescription Verification   Alprazolam                     111          EXPECTED   ng/mg creat   Alpha-hydroxyalprazolam        162          EXPECTED   ng/mg creat    Source of alprazolam is a scheduled prescription medication.    Alpha-hydroxyalprazolam is an expected metabolite of alprazolam.   Hydrocodone                    560          EXPECTED   ng/mg creat   Hydromorphone                  30           EXPECTED   ng/mg creat   Dihydrocodeine                 86           EXPECTED   ng/mg creat   Norhydrocodone                 943          EXPECTED   ng/mg creat    Sources of hydrocodone include scheduled prescription    medications. Hydromorphone, dihydrocodeine and norhydrocodone are    expected metabolites of hydrocodone. Hydromorphone and    dihydrocodeine are also available as scheduled prescription    medications. ==================================================================== Test                      Result    Flag   Units      Ref Range   Creatinine  206              mg/dL      >=20 ==================================================================== Declared Medications:  The flagging and interpretation on this report are based on the  following declared medications.  Unexpected results may arise from  inaccuracies in the declared medications.  **Note: The testing scope of this panel includes these medications:  Alprazolam  Hydrocodone (Hydrocodone-Acetaminophen)  **Note: The testing scope of this panel does not include following  reported medications:  Acetaminophen (Hydrocodone-Acetaminophen)  Albuterol  Amlodipine Besylate  Bupropion  Calcium citrate  (Calcium citrate/Vitamin D)  Clobetasol  Dabigatran  Dexlansoprazole  Duloxetine  Estradiol  Fesoterodine  Fluticasone (Advair)  Gabapentin  Hydrochlorothiazide  Levocetirizine  Levothyroxine  Losartan (Losartan Potassium)  Metformin  Metoprolol  Montelukast  Mupirocin  Nystatin  Ondansetron  Oxybutynin  Pregabalin  Salmeterol (Advair)  Tiotropium  Topical Diclofenac  Vitamin D (Calcium citrate/Vitamin D)  Zinc Oxide ==================================================================== For clinical consultation, please call [phone]. ====================================================================    Laboratory Chemistry Profile (12 mo)  Renal: 11/09/2018: BUN 21; Creatinine, Ser 1.22  Lab Results  Component Value Date   GFRAA 52 (L) 11/09/2018   GFRNONAA 45 (L) 11/09/2018   Hepatic: 09/28/2018: Albumin 3.7 Lab Results  Component Value Date   AST 18 09/28/2018   ALT 13 09/28/2018   Other: No results found for requested labs within last 8760 hours. Note: Above Lab results reviewed.  Imaging  Last 90 days:  US Venous Img Lower Unilateral Left  Result Date: 09/30/2018 CLINICAL DATA:  69 year old with left leg swelling and pain. History of DVT. EXAM: LEFT LOWER EXTREMITY VENOUS DOPPLER ULTRASOUND TECHNIQUE: Gray-scale sonography with graded compression, as well as color Doppler and duplex ultrasound were performed to evaluate the lower extremity deep venous systems from the level of the common femoral vein and including the common femoral, femoral, profunda femoral, popliteal and calf veins including the posterior tibial, peroneal and gastrocnemius veins when visible. The superficial great saphenous vein was also interrogated. Spectral Doppler was utilized to evaluate flow at rest and with distal augmentation maneuvers in the common femoral, femoral and popliteal veins. COMPARISON:  10/01/2016 FINDINGS: Contralateral Common Femoral Vein: Respiratory phasicity is  normal and symmetric with the symptomatic side. No evidence of thrombus. Normal compressibility. Common Femoral Vein: No evidence of thrombus. Normal compressibility, respiratory phasicity and response to augmentation. Saphenofemoral Junction: No evidence of thrombus. Normal compressibility and flow on color Doppler imaging. Profunda Femoral Vein: No evidence of thrombus. Normal compressibility and flow on color Doppler imaging. Femoral Vein: No evidence of thrombus. Normal compressibility, respiratory phasicity and response to augmentation. Popliteal Vein: No evidence of thrombus. Normal compressibility, respiratory phasicity and response to augmentation. Calf Veins: Limited evaluation of the deep calf veins due to body habitus and edema. Other Findings:  None. IMPRESSION: Negative for deep venous thrombosis in left lower extremity. Limited evaluation of the left deep calf veins. Electronically Signed   By: Markus Daft M.D.   On: 09/30/2018 16:24    Assessment  The primary encounter diagnosis was Chronic pain syndrome. Diagnoses of Chronic musculoskeletal pain, Fibromyalgia, Neuropathy of right lateral femoral cutaneous nerve, Meralgia paresthetica of right side, Osteoarthritis involving multiple joints, Chronic anticoagulation (Pradaxa), and Diabetic peripheral neuropathy associated with type 2 diabetes mellitus ([place]) were also pertinent to this visit.  Plan of Care  I have discontinued Izora Gala B. Dudek's HYDROcodone-acetaminophen, HYDROcodone-acetaminophen, ciprofloxacin, and fluconazole. I have also changed her pregabalin and HYDROcodone-acetaminophen. Additionally, I am having her start on HYDROcodone-acetaminophen  and HYDROcodone-acetaminophen. Lastly, I am having her maintain her albuterol, albuterol, zinc oxide, mupirocin ointment, nystatin cream, estradiol, clobetasol cream, montelukast, hydrochlorothiazide, amLODipine, losartan, Dexlansoprazole, ondansetron, dabigatran, metFORMIN, levothyroxine,  fesoterodine, fluticasone-salmeterol, buPROPion, levocetirizine, oxybutynin, phenazopyridine, Dexilant, sucralfate, metoprolol succinate, ALPRAZolam, meloxicam, and DULoxetine. We will continue to administer ipratropium-albuterol.  Pharmacotherapy (Medications Ordered): Meds ordered this encounter  Medications  . DULoxetine (CYMBALTA) 60 MG capsule    Sig: Take 1 capsule (60 mg total) by mouth daily.    Dispense:  90 capsule    Refill:  0    Fill one day early if pharmacy is closed on scheduled refill date. May substitute for generic if available.  . pregabalin (LYRICA) 50 MG capsule    Sig: Take 1 capsule (50 mg total) by mouth 3 (three) times daily.    Dispense:  90 capsule    Refill:  2    Fill one day early if pharmacy is closed on scheduled refill date. May substitute for generic if available.  Marland Kitchen HYDROcodone-acetaminophen (NORCO/VICODIN) 5-325 MG tablet    Sig: Take 1 tablet by mouth every 8 (eight) hours as needed for severe pain. Must last 30 days    Dispense:  90 tablet    Refill:  0    Chronic Pain: STOP Act (Not applicable) Fill 1 day early if closed on refill date. Do not fill until: 12/05/2018. To last until: 01/04/2019. Avoid benzodiazepines within 8 hours of opioids  . HYDROcodone-acetaminophen (NORCO/VICODIN) 5-325 MG tablet    Sig: Take 1 tablet by mouth every 8 (eight) hours as needed for severe pain. Must last 30 days    Dispense:  90 tablet    Refill:  0    Chronic Pain: STOP Act (Not applicable) Fill 1 day early if closed on refill date. Do not fill until: 01/04/2019. To last until: 02/03/2019. Avoid benzodiazepines within 8 hours of opioids  . HYDROcodone-acetaminophen (NORCO/VICODIN) 5-325 MG tablet    Sig: Take 1 tablet by mouth every 8 (eight) hours as needed for severe pain. Must last 30 days    Dispense:  90 tablet    Refill:  0    Chronic Pain: STOP Act (Not applicable) Fill 1 day early if closed on refill date. Do not fill until: 02/03/2019. To last until:  03/05/2019. Avoid benzodiazepines within 8 hours of opioids   Orders:  No orders of the defined types were placed in this encounter.  Follow-up plan:   Return in about 3 months (around 03/01/2019) for (VV), E/M, (MM) to evaluate the Lyrica titration.      Interventional therapies: Planned, scheduled, and/or pending:   Not at this time. (Always stop Pradaxa x 5 days prior to any blocks.)   Considering:   NOTE: PRADAXA ANTICOAGULATION (Stop: 5 days  Restart: 6 hours) NO LUMBAR RFA until BMI < 35. Diagnostic right suprascapular NB Diagnostic right IA knee injection  Possible series of 5 Hyalgan knee injections Diagnostic right-sided genicular NB Possible right-sided genicular nerve RFA  Diagnostic right-sided suprascapular NB  Possible right-sided suprascapular nerve RFA    Palliative PRN treatment(s):   Palliative right IA shoulder joint injection  Palliative right lateral femoral cutaneous NB Palliative right lateral femoral continuous nerve RFA #2     Recent Visits No visits were found meeting these conditions.  Showing recent visits within past 90 days and meeting all other requirements   Today's Visits Date Type Provider Dept  12/05/18 Office Visit Milinda Pointer, MD [facility]  Showing today's visits and  meeting all other requirements   Future Appointments No visits were found meeting these conditions.  Showing future appointments within next 90 days and meeting all other requirements   I discussed the assessment and treatment plan with the patient. The patient was provided an opportunity to ask questions and all were answered. The patient agreed with the plan and demonstrated an understanding of the instructions.  Patient advised to call back or seek an in-person evaluation if the symptoms or condition worsens.  Total duration of non-face-to-face encounter: 13 minutes.  Note by: Gaspar Cola, MD Date: 12/05/2018; Time: 2:57 PM  Note: This  dictation was prepared with Dragon dictation. Any transcriptional errors that may result from this process are unintentional.  Disclaimer:  * Given the special circumstances of the COVID-19 pandemic, the federal government has announced that the Office for Civil Rights (OCR) will exercise its enforcement discretion and will not impose penalties on physicians using telehealth in the event of noncompliance with regulatory requirements under the [place] and [place] (HIPAA) in connection with the good faith provision of telehealth during the XX123456 national public health emergency. ([place])

## 2018-12-05 ENCOUNTER — Other Ambulatory Visit: Payer: Self-pay | Admitting: Physician Assistant

## 2018-12-05 ENCOUNTER — Ambulatory Visit: Payer: PPO | Attending: Pain Medicine | Admitting: Pain Medicine

## 2018-12-05 ENCOUNTER — Other Ambulatory Visit: Payer: Self-pay | Admitting: Cardiovascular Disease

## 2018-12-05 ENCOUNTER — Telehealth: Payer: Self-pay

## 2018-12-05 ENCOUNTER — Other Ambulatory Visit: Payer: Self-pay

## 2018-12-05 DIAGNOSIS — M797 Fibromyalgia: Secondary | ICD-10-CM

## 2018-12-05 DIAGNOSIS — G8929 Other chronic pain: Secondary | ICD-10-CM

## 2018-12-05 DIAGNOSIS — Z7901 Long term (current) use of anticoagulants: Secondary | ICD-10-CM | POA: Insufficient documentation

## 2018-12-05 DIAGNOSIS — M7918 Myalgia, other site: Secondary | ICD-10-CM | POA: Diagnosis not present

## 2018-12-05 DIAGNOSIS — M159 Polyosteoarthritis, unspecified: Secondary | ICD-10-CM

## 2018-12-05 DIAGNOSIS — M150 Primary generalized (osteo)arthritis: Secondary | ICD-10-CM | POA: Diagnosis not present

## 2018-12-05 DIAGNOSIS — G5711 Meralgia paresthetica, right lower limb: Secondary | ICD-10-CM | POA: Diagnosis not present

## 2018-12-05 DIAGNOSIS — G894 Chronic pain syndrome: Secondary | ICD-10-CM

## 2018-12-05 DIAGNOSIS — M8949 Other hypertrophic osteoarthropathy, multiple sites: Secondary | ICD-10-CM

## 2018-12-05 DIAGNOSIS — E1142 Type 2 diabetes mellitus with diabetic polyneuropathy: Secondary | ICD-10-CM

## 2018-12-05 MED ORDER — HYDROCODONE-ACETAMINOPHEN 5-325 MG PO TABS: 1.0000 | ORAL_TABLET | Freq: Three times a day (TID) | ORAL | 0 refills | Status: DC | PRN

## 2018-12-05 MED ORDER — PREGABALIN 50 MG PO CAPS: 50.0000 mg | ORAL_CAPSULE | Freq: Three times a day (TID) | ORAL | 2 refills | Status: DC

## 2018-12-05 MED ORDER — DULOXETINE HCL 60 MG PO CPEP: 60.0000 mg | ORAL_CAPSULE | Freq: Every day | ORAL | 0 refills | Status: DC

## 2018-12-05 NOTE — Telephone Encounter (Signed)
Called patient to confirm appointment scheduled for tomorrow. Patient answered the following questions: 1. To the best of your knowledge, have you been in close contact with any one with a confirmed diagnosis of COVID-19? No 2. Have you had any one or more of the following; fever, chills, cough, shortness of breath, or any flu-like symptoms? No 3. Have you been diagnosed with or have a previous diagnosis of COVID 19? No 4. I am going to go over a few other symptoms with you. Please let me know if you are experiencing any of the following: None of the below a. Ear, nose, or throat discomfort b. A sore throat c. Headache d. Muscle pain e. Diarrhea f. Loss of taste or smell   

## 2018-12-06 ENCOUNTER — Encounter: Payer: Self-pay | Admitting: Surgical

## 2018-12-06 ENCOUNTER — Ambulatory Visit (INDEPENDENT_AMBULATORY_CARE_PROVIDER_SITE_OTHER): Payer: PPO | Admitting: Physician Assistant

## 2018-12-06 ENCOUNTER — Ambulatory Visit: Payer: PPO | Admitting: Surgical

## 2018-12-06 ENCOUNTER — Encounter: Payer: Self-pay | Admitting: Physician Assistant

## 2018-12-06 ENCOUNTER — Other Ambulatory Visit: Payer: Self-pay | Admitting: *Deleted

## 2018-12-06 ENCOUNTER — Other Ambulatory Visit: Payer: Self-pay

## 2018-12-06 VITALS — BP 153/85 | HR 68 | Ht 65.0 in | Wt 252.0 lb

## 2018-12-06 VITALS — BP 147/84 | HR 69 | Temp 98.8°F | Ht 65.5 in | Wt 252.0 lb

## 2018-12-06 DIAGNOSIS — R300 Dysuria: Secondary | ICD-10-CM

## 2018-12-06 DIAGNOSIS — L97522 Non-pressure chronic ulcer of other part of left foot with fat layer exposed: Secondary | ICD-10-CM | POA: Diagnosis not present

## 2018-12-06 DIAGNOSIS — I739 Peripheral vascular disease, unspecified: Secondary | ICD-10-CM | POA: Diagnosis not present

## 2018-12-06 DIAGNOSIS — E1142 Type 2 diabetes mellitus with diabetic polyneuropathy: Secondary | ICD-10-CM

## 2018-12-06 LAB — URINALYSIS, COMPLETE
Bilirubin, UA: NEGATIVE
Glucose, UA: NEGATIVE
Ketones, UA: NEGATIVE
Leukocytes,UA: NEGATIVE
Nitrite, UA: NEGATIVE
Protein,UA: NEGATIVE
RBC, UA: NEGATIVE
Specific Gravity, UA: 1.015 (ref 1.005–1.030)
Urobilinogen, Ur: 0.2 mg/dL (ref 0.2–1.0)
pH, UA: 5.5 (ref 5.0–7.5)

## 2018-12-06 LAB — MICROSCOPIC EXAMINATION
Epithelial Cells (non renal): NONE SEEN /hpf (ref 0–10)
RBC, Urine: NONE SEEN /hpf (ref 0–2)

## 2018-12-06 MED ORDER — ALPRAZOLAM 0.5 MG PO TABS: 0.5000 mg | ORAL_TABLET | Freq: Two times a day (BID) | ORAL | 2 refills | Status: DC | PRN

## 2018-12-06 NOTE — Progress Notes (Signed)
12/06/2018 8:06 AM   [name] 11-29-1950 [id number]  CC: Dysuria, urgency, leakage of urine  HPI: [name] is a 68 y.o. female who presents today for evaluation of possible UTI. She is an established [facility] patient who last saw Dr. Matilde Sprang on 10/24/2018 for follow-up of OAB.  She reports a 4-day history of dysuria, urgency, and urinary leakage. She denies nausea, vomiting, fevers, chills, flank pain, and gross hematuria associated with her current symptoms.  She does have a history of recurrent UTI, most recently 11/18/2018 with probable ESBL-producing E coli and treated with an extended course of nitrofurantoin x10 days. She reports that she did feel somewhat better after completing this antibiotic before her new symptom onset. She is not on daily prophylaxis.  In-office UA today negative; urine microscopy with 6-10 WBCs/HPF and moderate bacteria.   She reports an anaphylactic reaction to penicillins.  PMH: Past Medical History:  Diagnosis Date  . Abnormal antibody titer   . Anxiety and depression   . Arthritis   . Asthma   . Cystocele   . Depression   . Diabetes ([place])   . DVT (deep venous thrombosis) (HCC)    Righ calf  . Dyspnea    11/08/2018 - "more now due to lack of exercise"  . Dysrhythmia    afib  . Edema   . GERD (gastroesophageal reflux disease)   . Heart murmur    Echocardiogram June 2015: Mild MR (possible vegetation seen on TTE, not seen on TEE), normal LV size with moderate concentric LVH. Normal function EF 60-65%. Normal diastolic function. Mild LA dilation.  Marland Kitchen History of IBS   . History of kidney stones   . History of methicillin resistant staphylococcus aureus (MRSA) 2008  . Hyperlipidemia   . Hypertension   . Hypothyroidism   . Insomnia   . Kidney stone    kidney stones with lithotripsy .  [facility]- "adrenal glands"  . Neuropathy involving both lower extremities   . Obesity, Class II, BMI 35-39.9, with comorbidity   .  Paroxysmal atrial fibrillation ([place]) 2007   In June 2015, Cardiac Event Monitor: Mostly SR/sinus arrhythmia with PVCs that are frequent. Short bursts of A. fib lasting several minutes;; CHA2DS2-VASc Score = 5 (age, Female, PVD, DM, HTN)  . Peripheral vascular disease ([place])   . Sleep apnea    use C-PAP  . Urinary incontinence   . Venous stasis dermatitis of both lower extremities     Surgical History: Past Surgical History:  Procedure Laterality Date  . ANKLE SURGERY Right   . APPLICATION OF WOUND VAC Left 06/27/2015   Procedure: APPLICATION OF WOUND VAC ( POSSIBLE ) ;  Surgeon: [name], MD;  Location: [facility];  Service: Vascular;  Laterality: Left;  . APPLICATION OF WOUND VAC Left 09/25/2016   Procedure: APPLICATION OF WOUND VAC;  Surgeon: [name], DPM;  Location: [facility];  Service: Podiatry;  Laterality: Left;  . arm surgery     right    . CHOLECYSTECTOMY    . COLONOSCOPY    . COLONOSCOPY WITH PROPOFOL N/A 01/11/2017   Procedure: COLONOSCOPY WITH PROPOFOL;  Surgeon: [name], MD;  Location: [facility] ENDOSCOPY;  Service: Endoscopy;  Laterality: N/A;  . COLONOSCOPY WITH PROPOFOL N/A 02/22/2017   Procedure: COLONOSCOPY WITH PROPOFOL;  Surgeon: [name], MD;  Location: [facility] ENDOSCOPY;  Service: Endoscopy;  Laterality: N/A;  . COLONOSCOPY WITH PROPOFOL N/A 05/31/2017   Procedure: COLONOSCOPY WITH PROPOFOL;  Surgeon:  [name], MD;  Location: [facility] ENDOSCOPY;  Service: Endoscopy;  Laterality: N/A;  . ESOPHAGOGASTRODUODENOSCOPY (EGD) WITH PROPOFOL N/A 01/11/2017   Procedure: ESOPHAGOGASTRODUODENOSCOPY (EGD) WITH PROPOFOL;  Surgeon: [name], MD;  Location: [facility] ENDOSCOPY;  Service: Endoscopy;  Laterality: N/A;  . ESOPHAGOGASTRODUODENOSCOPY (EGD) WITH PROPOFOL N/A 10/25/2018   Procedure: ESOPHAGOGASTRODUODENOSCOPY (EGD) WITH PROPOFOL;  Surgeon: [name], MD;  Location: [facility] ENDOSCOPY;  Service: Endoscopy;  Laterality: N/A;  . HERNIA REPAIR      umbilical  . HIATAL HERNIA REPAIR    . I&D EXTREMITY Left 06/27/2015   Procedure: IRRIGATION AND DEBRIDEMENT EXTREMITY            ( CALF HEMATOMA ) POSSIBLE WOUND VAC;  Surgeon: [name], MD;  Location: [facility];  Service: Vascular;  Laterality: Left;  . INCISION AND DRAINAGE OF WOUND Left 09/25/2016   Procedure: IRRIGATION AND DEBRIDEMENT - PARTIAL RESECTION OF ACHILLES TENDON WITH WOUND VAC APPLICATION;  Surgeon: [name], DPM;  Location: [facility];  Service: Podiatry;  Laterality: Left;  . INCISION AND DRAINAGE OF WOUND Left 11/09/2018   Procedure: Excision of left foot wound with ACell placement;  Surgeon: Wallace Going, DO;  Location: [place];  Service: Plastics;  Laterality: Left;  . IRRIGATION AND DEBRIDEMENT ABSCESS Left 09/07/2016   Procedure: IRRIGATION AND DEBRIDEMENT ABSCESS LEFT HEEL;  Surgeon: [name], DPM;  Location: [facility];  Service: Podiatry;  Laterality: Left;  . JOINT REPLACEMENT  2013   left knee replacement  . KIDNEY SURGERY Right    kidney stones  . LITHOTRIPSY    . LOWER EXTREMITY ANGIOGRAPHY Left 04/04/2018   Procedure: LOWER EXTREMITY ANGIOGRAPHY;  Surgeon: [name], MD;  Location: [facility];  Service: Cardiovascular;  Laterality: Left;  . NM GATED MYOVIEW ([place] [facility])  February 2017   Likely breast attenuation. LOW RISK study. Normal EF 55-60%.  . OTHER SURGICAL HISTORY     08/2016 or 09/2016 surgery on achilles tenso h/o staph infection heal. Dr. [name]  . removal of left hematoma Left    leg  . REVERSE SHOULDER ARTHROPLASTY Right 10/08/2015   Procedure: REVERSE SHOULDER ARTHROPLASTY;  Surgeon: [name], MD;  Location: [facility];  Service: Orthopedics;  Laterality: Right;  . SLEEVE GASTROPLASTY    . TONSILLECTOMY    . TOTAL KNEE ARTHROPLASTY Left   . TOTAL SHOULDER REPLACEMENT Right   . TRANSESOPHAGEAL ECHOCARDIOGRAM  08/08/2013   Mild LVH, EF 60-65%. Moderate LA dilation and mild RA dilation. Mild MR with no evidence of  stenosis and no evidence of endocarditis. A false chordae is noted.  . TRANSTHORACIC ECHOCARDIOGRAM  08/03/2013   Mild-Moderate concentric LVH, EF 60-65%. Normal diastolic function. Mild LA dilation. Mild MR with possible vegetation  - not confirmed on TEE   . ULNAR NERVE TRANSPOSITION Right 08/06/2016   Procedure: ULNAR NERVE DECOMPRESSION/TRANSPOSITION;  Surgeon: [name], MD;  Location: [facility];  Service: Orthopedics;  Laterality: Right;  . UPPER GI ENDOSCOPY    . VAGINAL HYSTERECTOMY      Home Medications:  Allergies as of 12/06/2018      Reactions   Penicillins Hives, Shortness Of Breath, Swelling, Other (See Comments)   Facial swelling Has patient had a PCN reaction causing immediate rash, facial/tongue/throat swelling, SOB or lightheadedness with hypotension: Yes Has patient had a PCN reaction causing severe rash involving mucus membranes or skin necrosis: Yes Has patient had a PCN reaction that required hospitalization No Has patient had  a PCN reaction occurring within the last 10 years: No If all of the above answers are "NO", then may proceed with Cephalosporin use.   Aspirin Hives   Morphine And Related Other (See Comments)   Patient becomes very confused   Terramycin [oxytetracycline] Hives      Medication List       Accurate as of December 06, 2018 11:59 PM. If you have any questions, ask your nurse or doctor.        albuterol (2.5 MG/3ML) 0.083% nebulizer solution Commonly known as: PROVENTIL Take 3 mLs (2.5 mg total) by nebulization every 6 (six) hours as needed for wheezing or shortness of breath. What changed: Another medication with the same name was changed. Make sure you understand how and when to take each.   albuterol 108 (90 Base) MCG/ACT inhaler Commonly known as: VENTOLIN HFA Inhale 1-2 puffs into the lungs every 4 (four) hours as needed for wheezing or shortness of breath. What changed: how much to take   ALPRAZolam 0.5 MG tablet Commonly  known as: XANAX Take 1 tablet (0.5 mg total) by mouth 2 (two) times daily as needed for anxiety.   amLODipine 5 MG tablet Commonly known as: NORVASC Take 1 tablet (5 mg total) by mouth daily.   buPROPion 150 MG 24 hr tablet Commonly known as: WELLBUTRIN XL Take 1 tablet (150 mg total) by mouth daily.   clobetasol cream 0.05 % Commonly known as: TEMOVATE Apply 1 application topically 2 (two) times daily. To arms What changed:   when to take this  reasons to take this   dabigatran 150 MG Caps capsule Commonly known as: Pradaxa Take 1 capsule (150 mg total) by mouth 2 (two) times daily.   Dexilant 60 MG capsule Generic drug: dexlansoprazole Take 60 mg by mouth daily before breakfast.   Dexlansoprazole 30 MG capsule Commonly known as: Dexilant Take 1 capsule (30 mg total) by mouth daily. Note lower maintenance dose take 30 min before food lunch or dinner not with thyroid medication   DULoxetine 60 MG capsule Commonly known as: CYMBALTA Take 1 capsule (60 mg total) by mouth daily.   estradiol 0.1 MG/GM vaginal cream Commonly known as: ESTRACE Apply 0.5mg  (pea-sized amount)  just inside the vaginal introitus with a finger-tip on Monday, Wednesday and Friday nights, What changed:   how much to take  how to take this  when to take this   fesoterodine 8 MG Tb24 tablet Commonly known as: TOVIAZ Take 1 tablet (8 mg total) by mouth daily.   fluticasone-salmeterol 115-21 MCG/ACT inhaler Commonly known as: Advair HFA Inhale 2 puffs into the lungs daily. Rinse mouth thoroughly after use   hydrochlorothiazide 12.5 MG tablet Commonly known as: HYDRODIURIL Take 1 tablet (12.5 mg total) by mouth daily. In am   HYDROcodone-acetaminophen 5-325 MG tablet Commonly known as: NORCO/VICODIN Take 1 tablet by mouth every 8 (eight) hours as needed for severe pain. Must last 30 days Start taking on: February 03, 2019 What changed: Another medication with the same name was removed.  Continue taking this medication, and follow the directions you see here. Changed by: Debroah Loop, PA-C   levocetirizine 5 MG tablet Commonly known as: XYZAL Take 1 tablet (5 mg total) by mouth every evening.   levothyroxine 175 MCG tablet Commonly known as: SYNTHROID Take 1 tablet (175 mcg total) by mouth daily before breakfast.   losartan 50 MG tablet Commonly known as: COZAAR Take 1 tablet (50 mg total) by mouth daily.  Do not take quinapril 40   meloxicam 15 MG tablet Commonly known as: MOBIC Take 1 tablet (15 mg total) by mouth daily as needed for pain.   metFORMIN 1000 MG tablet Commonly known as: GLUCOPHAGE Take 1 tablet (1,000 mg total) by mouth 2 (two) times daily. With food What changed:   how much to take  when to take this  additional instructions   metoprolol succinate 50 MG 24 hr tablet Commonly known as: TOPROL-XL Take 1 tablet (50 mg total) by mouth daily. PLEASE CALL [phone] TO SCHEDULE APPOINTMENT FOR FURTHER REFILLS.   montelukast 10 MG tablet Commonly known as: SINGULAIR Take 1 tablet (10 mg total) by mouth daily. What changed: when to take this   mupirocin ointment 2 % Commonly known as: Bactroban Apply 1 application topically 2 (two) times daily. Skin wounds What changed:   when to take this  reasons to take this  additional instructions   nystatin cream Commonly known as: MYCOSTATIN Apply 1 application topically 2 (two) times daily. Mouth prn for 7 days as needed What changed:   when to take this  reasons to take this   ondansetron 4 MG tablet Commonly known as: Zofran Take 1 tablet (4 mg total) by mouth every 8 (eight) hours as needed for nausea or vomiting.   oxybutynin 15 MG 24 hr tablet Commonly known as: DITROPAN XL TAKE ONE TABLET BY MOUTH AT BEDTIME   phenazopyridine 200 MG tablet Commonly known as: Pyridium Take 1 tablet (200 mg total) by mouth 3 (three) times daily as needed for pain.   pregabalin 50  MG capsule Commonly known as: Lyrica Take 1 capsule (50 mg total) by mouth 3 (three) times daily.   sucralfate 1 GM/10ML suspension Commonly known as: CARAFATE Take 1 g by mouth 2 (two) times daily before a meal.   zinc oxide 20 % ointment Apply 1 application topically as needed for irritation.       Allergies:  Allergies  Allergen Reactions  . Penicillins Hives, Shortness Of Breath, Swelling and Other (See Comments)    Facial swelling Has patient had a PCN reaction causing immediate rash, facial/tongue/throat swelling, SOB or lightheadedness with hypotension: Yes Has patient had a PCN reaction causing severe rash involving mucus membranes or skin necrosis: Yes Has patient had a PCN reaction that required hospitalization No Has patient had a PCN reaction occurring within the last 10 years: No If all of the above answers are "NO", then may proceed with Cephalosporin use.   . Aspirin Hives  . Morphine And Related Other (See Comments)    Patient becomes very confused  . Terramycin [Oxytetracycline] Hives    Family History: Family History  Problem Relation Age of Onset  . Skin cancer Father   . Diabetes Father   . Hypertension Father   . Peripheral vascular disease Father   . Cancer Father   . Cerebral aneurysm Father   . Alcohol abuse Father   . Varicose Veins Mother   . Kidney disease Mother   . Arthritis Mother   . Mental illness Sister   . Cancer Maternal Aunt        breast  . Breast cancer Maternal Aunt   . Arthritis Maternal Grandmother   . Hypertension Maternal Grandmother   . Diabetes Maternal Grandmother   . Arthritis Maternal Grandfather   . Heart disease Maternal Grandfather   . Hypertension Maternal Grandfather   . Arthritis Paternal Grandmother   . Hypertension Paternal Grandmother   .  Diabetes Paternal Grandmother   . Arthritis Paternal Grandfather   . Hypertension Paternal Grandfather   . Bladder Cancer Neg Hx   . Kidney cancer Neg Hx      Social History:   reports that she has never smoked. She has never used smokeless tobacco. She reports that she does not drink alcohol or use drugs.  ROS: UROLOGY Frequent Urination?: Yes Hard to postpone urination?: Yes Burning/pain with urination?: Yes Get up at night to urinate?: Yes Leakage of urine?: Yes Urine stream starts and stops?: No Trouble starting stream?: No Do you have to strain to urinate?: No Blood in urine?: No Urinary tract infection?: Yes Sexually transmitted disease?: No Injury to kidneys or bladder?: No Painful intercourse?: No Weak stream?: No Currently pregnant?: No Vaginal bleeding?: No Last menstrual period?: n  Gastrointestinal Nausea?: Yes Vomiting?: No Indigestion/heartburn?: Yes Diarrhea?: No Constipation?: No  Constitutional Fever: No Night sweats?: No Weight loss?: No Fatigue?: Yes  Skin Skin rash/lesions?: No Itching?: No  Eyes Blurred vision?: No Double vision?: No  Ears/Nose/Throat Sore throat?: No Sinus problems?: Yes  Hematologic/Lymphatic Swollen glands?: No Easy bruising?: Yes  Cardiovascular Leg swelling?: Yes Chest pain?: No  Respiratory Cough?: No Shortness of breath?: No  Endocrine Excessive thirst?: Yes  Musculoskeletal Back pain?: No Joint pain?: Yes  Neurological Headaches?: No Dizziness?: No  Psychologic Depression?: No Anxiety?: No  Physical Exam: BP (!) 153/85   Pulse 68   Ht 5\' 5"  (1.651 m)   Wt 252 lb (114.3 kg)   BMI 41.93 kg/m   Constitutional:  Alert and oriented, no acute distress, nontoxic appearing HEENT: [place], AT Cardiovascular: No clubbing, cyanosis, or edema Respiratory: Normal respiratory effort, no increased work of breathing Skin: Solitary hive of the dorsal right wrist Neurologic: Grossly intact, no focal deficits, moving all 4 extremities, in wheelchair Psychiatric: Normal mood and affect  Laboratory Data: Results for orders placed or performed in visit on  12/06/18  Microscopic Examination   URINE  Result Value Ref Range   WBC, UA 6-10 (A) 0 - 5 /hpf   RBC None seen 0 - 2 /hpf   Epithelial Cells (non renal) None seen 0 - 10 /hpf   Bacteria, UA Moderate (A) None seen/Few  Urinalysis, Complete  Result Value Ref Range   Specific Gravity, UA 1.015 1.005 - 1.030   pH, UA 5.5 5.0 - 7.5   Color, UA Yellow Yellow   Appearance Ur Clear Clear   Leukocytes,UA Negative Negative   Protein,UA Negative Negative/Trace   Glucose, UA Negative Negative   Ketones, UA Negative Negative   RBC, UA Negative Negative   Bilirubin, UA Negative Negative   Urobilinogen, Ur 0.2 0.2 - 1.0 mg/dL   Nitrite, UA Negative Negative   Microscopic Examination See below:    Assessment & Plan:   1. Dysuria Patient with symptoms of acute cystitis, UA suggestive of infection or post-infectious inflammation. Will send for culture. Given patient's recent history of ESBL-producing E coli, will defer treatment today pending culture results.  I explained to the patient that I will contact her with the results of her urine culture as soon as I receive them. Will treat with oral antibiotics as available, however given her history I did advise her that I may need to refer her to ID for antibiotic management. She expressed understanding of this plan. - Urinalysis, Complete - CULTURE, URINE COMPREHENSIVE  Debroah Loop, PA-C  [facility] [address] [place], [place] [address] [phone]

## 2018-12-06 NOTE — Telephone Encounter (Signed)
Please schedule overdue F/U with Dr. Arida. Thank you! 

## 2018-12-06 NOTE — Progress Notes (Signed)
Subjective:     Patient ID: [name], female    DOB: [birth year], 68 y.o.   MRN: [id number]  Chief Complaint  Patient presents with  . Follow-up    HPI: The patient is a 68 y.o. female here for follow-up on her left foot wounds.  She had excision of her left leg wound and left foot wound with placement of ACell on 11/09/2018.  At her last visit donated ACell was placed on the heel wound and she has been doing daily KY gel and gauze dressing changes.  She reports that her last A1c was 6.6.  She has been eating healthy.  She has been walking at home and the pain has been adequately controlled.  It appears she has developed a small hematoma/blood blister on the lateral aspect of her superior ankle/leg.  She is unsure what this is from but noticed it after removing the dressing after the last visit.  No fever, chills, nausea, vomiting.  She reports that she has been having recurrent UTIs and is going to see urology today.  Review of Systems  Constitutional: Negative for chills, fever, malaise/fatigue and weight loss.  Respiratory: Negative.   Cardiovascular: Negative.   Genitourinary: Positive for dysuria and urgency.  Musculoskeletal:       Positive foot pain  Skin: Negative for itching and rash.       Positive wound, positive hematoma  Neurological: Positive for sensory change. Negative for dizziness, weakness and headaches.     Objective:   Vital Signs BP (!) 147/84 (BP Location: Left Arm, Patient Position: Sitting, Cuff Size: Large)   Pulse 69   Temp 98.8 F (37.1 C) (Temporal)   Ht 5' 5.5" (1.664 m)   Wt 252 lb (114.3 kg)   SpO2 96%   BMI 41.30 kg/m  Vital Signs and Nursing Note Reviewed Chaperone present Physical Exam  Constitutional: She is oriented to person, place, and time and well-developed, well-nourished, and in no distress.  HENT:  Head: Normocephalic and atraumatic.  Neck: Normal range of motion. Neck supple.  Cardiovascular: Normal rate.  Left  toes appear purple, this is not new for her.  Adequate cap refill  Pulmonary/Chest: Effort normal.  Symmetric rise and fall  Musculoskeletal: Normal range of motion.        General: Tenderness present. No deformity or edema.  Neurological: She is alert and oriented to person, place, and time.  Antalgic gait  Skin: Skin is warm and dry. No rash noted. She is not diaphoretic. No erythema. No pallor.  Positive hematoma/blood blister on left lateral ankle/leg  Psychiatric: Mood and affect normal.      Assessment/Plan:     ICD-10-CM   1. Skin ulcer of left foot with fat layer exposed ([place])  L97.522   2. Diabetic peripheral neuropathy associated with type 2 diabetes mellitus (HCC)  E11.42   3. Peripheral vascular disease ([place])  I73.9    [name] is doing well overall.  Her wound is beginning to granulate and incorporated the ACell.  New additional donated ACell was placed on the left heel wound along with Adaptic, K-Y jelly, gauze.  It was wrapped with Kerlix and an Ace bandage.  She can shower on Saturday.  Until then do daily KY dressing changes.  After showering she can continue with daily KY dressing changes and follow-up in 2 weeks  Continue to eat healthy and avoid sugar and carbs.  It is important that she maintains a low A1c.  Pictures  were obtained of the patient and placed in the chart with the patient's or guardian's permission.   Carola Rhine Lada Fulbright, PA-C 12/06/2018, 1:55 PM

## 2018-12-06 NOTE — Telephone Encounter (Signed)
Okay to refill?  Historical Med   LOV: 11/08/18 NOV: 02/14/19

## 2018-12-06 NOTE — Telephone Encounter (Signed)
Patient has been lvm to schedule appt

## 2018-12-07 ENCOUNTER — Ambulatory Visit: Payer: PPO | Admitting: Physician Assistant

## 2018-12-08 LAB — CULTURE, URINE COMPREHENSIVE

## 2018-12-09 ENCOUNTER — Other Ambulatory Visit (INDEPENDENT_AMBULATORY_CARE_PROVIDER_SITE_OTHER): Payer: PPO | Admitting: Physician Assistant

## 2018-12-09 DIAGNOSIS — N390 Urinary tract infection, site not specified: Secondary | ICD-10-CM

## 2018-12-09 MED ORDER — FOSFOMYCIN TROMETHAMINE 3 G PO PACK: 3.0000 g | PACK | Freq: Once | ORAL | 0 refills | Status: AC

## 2018-12-09 NOTE — Progress Notes (Signed)
Patient with her second multi-drug-resistant UTI with different organisms in one month. Prescribing fosfomycin 3g x1 dose today and placed an urgent ID referral.  I contacted the patient via telephone to inform her of the above. She reports feeling "terrible," with urinary frequency interrupting her sleep for the past two days. I advised her to take the fosfomycin today and follow up with ID regardless of symptomatic relief; I do want their input in her care given this recent resistance pattern. She expressed understanding.

## 2018-12-11 ENCOUNTER — Other Ambulatory Visit: Payer: Self-pay | Admitting: Pain Medicine

## 2018-12-11 DIAGNOSIS — E1142 Type 2 diabetes mellitus with diabetic polyneuropathy: Secondary | ICD-10-CM

## 2018-12-11 DIAGNOSIS — M797 Fibromyalgia: Secondary | ICD-10-CM

## 2018-12-13 ENCOUNTER — Ambulatory Visit: Payer: PPO | Admitting: Infectious Diseases

## 2018-12-14 ENCOUNTER — Telehealth: Payer: Self-pay | Admitting: Urology

## 2018-12-14 ENCOUNTER — Other Ambulatory Visit: Payer: Self-pay | Admitting: Internal Medicine

## 2018-12-14 DIAGNOSIS — E119 Type 2 diabetes mellitus without complications: Secondary | ICD-10-CM

## 2018-12-14 DIAGNOSIS — I10 Essential (primary) hypertension: Secondary | ICD-10-CM

## 2018-12-14 MED ORDER — HYDROCHLOROTHIAZIDE 12.5 MG PO TABS: 12.5000 mg | ORAL_TABLET | Freq: Every day | ORAL | 3 refills | Status: DC

## 2018-12-14 MED ORDER — LOSARTAN POTASSIUM 50 MG PO TABS: 50.0000 mg | ORAL_TABLET | Freq: Every day | ORAL | 3 refills | Status: DC

## 2018-12-14 MED ORDER — METFORMIN HCL 1000 MG PO TABS: 1000.0000 mg | ORAL_TABLET | Freq: Two times a day (BID) | ORAL | 3 refills | Status: DC

## 2018-12-14 NOTE — Telephone Encounter (Signed)
Leslie left VM and would like a call back to verify if pt is supposed to be taking Toviaz and Oxybutnin ER 15 mg.  She would like a call back @ [phone]

## 2018-12-14 NOTE — Telephone Encounter (Signed)
I just spoke with [name] from the [facility].  She wanted to verify that Ms. Sherk should be taking both Toviaz and oxybutynin.  Per Dr. Mikle Bosworth note from 10/24/2018, she should be on dual therapy and is due to return to the office in December for 45-month medication check.  I relayed this information.

## 2018-12-18 IMAGING — CT CT ANGIO CHEST
2 of 7 series · 18 of 46 positions shown · IV contrast (APPLIED)
Comparison: Chest CT PE protocol 4 weeks prior 09/05/2016.
Additional prior exams reviewed.

CLINICAL DATA: Hypoxia and shortness of breath.

EXAM:
CT ANGIOGRAPHY CHEST WITH CONTRAST
TECHNIQUE: Multidetector CT imaging of the chest was performed using the
standard protocol during bolus administration of intravenous
contrast. Multiplanar CT image reconstructions and MIPs were
obtained to evaluate the vascular anatomy.
CONTRAST:  75 cc Isovue 370 IV

[Series 6: thins · axial · 0.80mm/px · z∈[-838,-549]mm · 16 of 323 slices shown]
[im 17/323  lung]
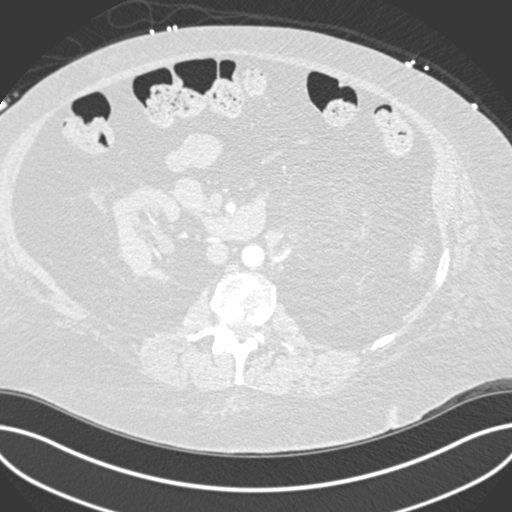
[im 34/323  soft-tissue]
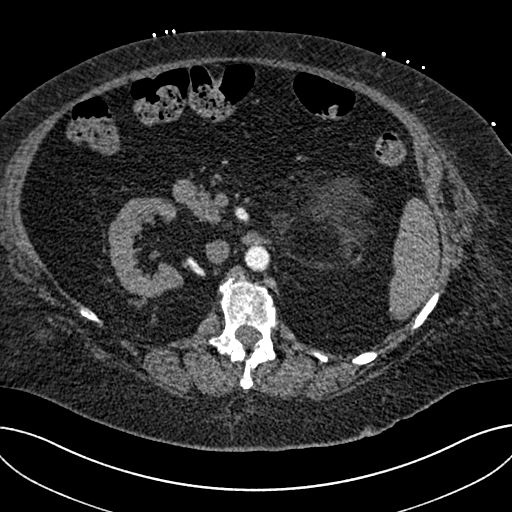
[im 51/323  lung]
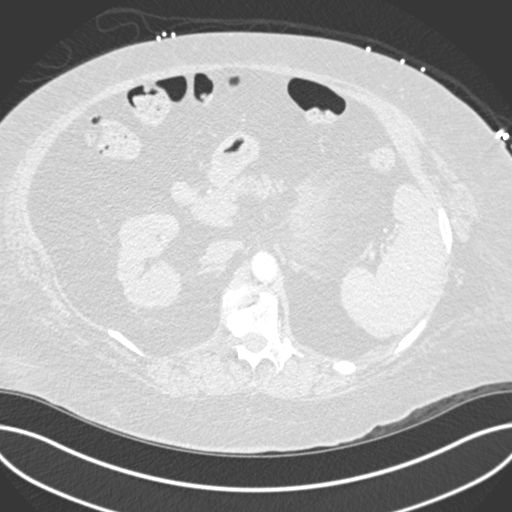
[im 68/323  soft-tissue]
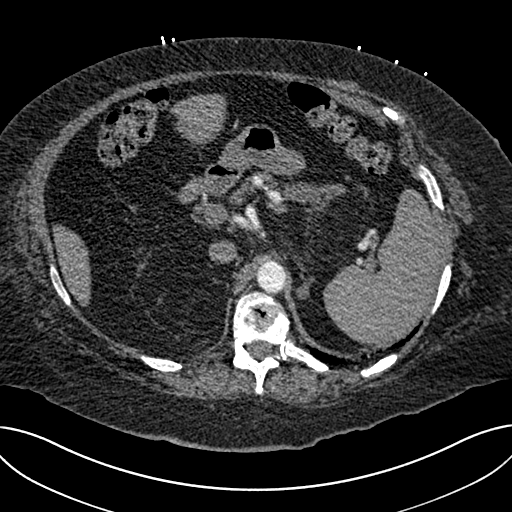
[im 102/323  lung]
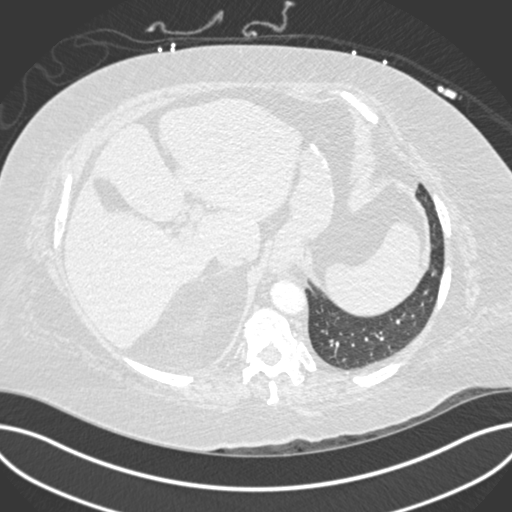
[im 119/323  soft-tissue]
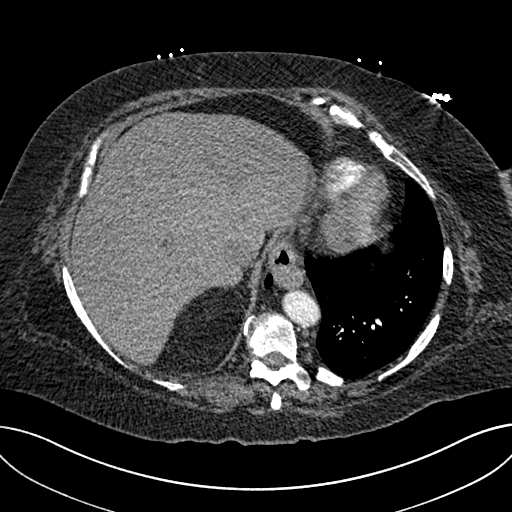
[im 136/323  lung]
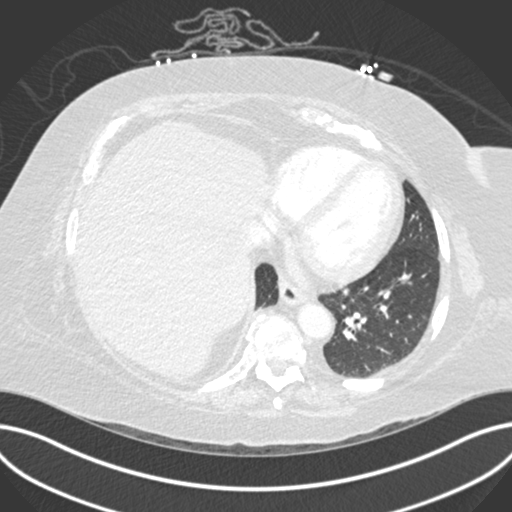
[im 153/323  soft-tissue]
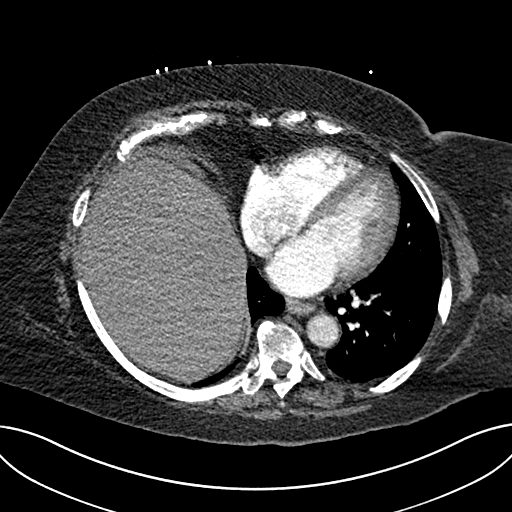
[im 170/323  lung]
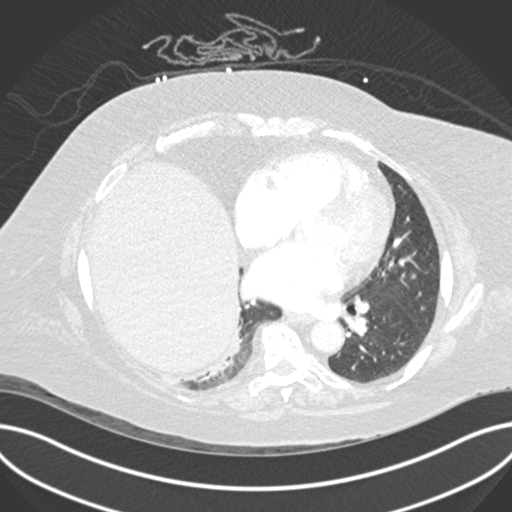
[im 187/323  soft-tissue]
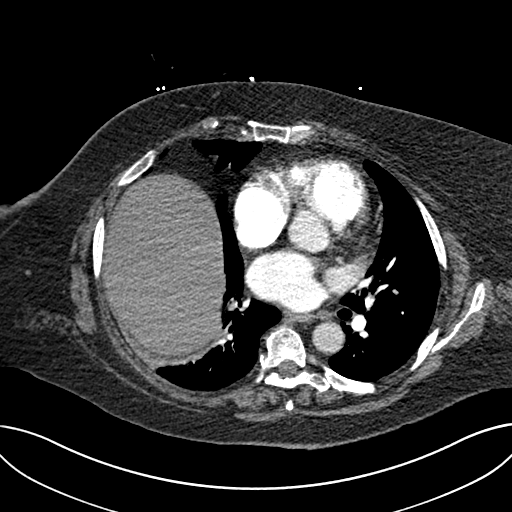
[im 204/323  lung]
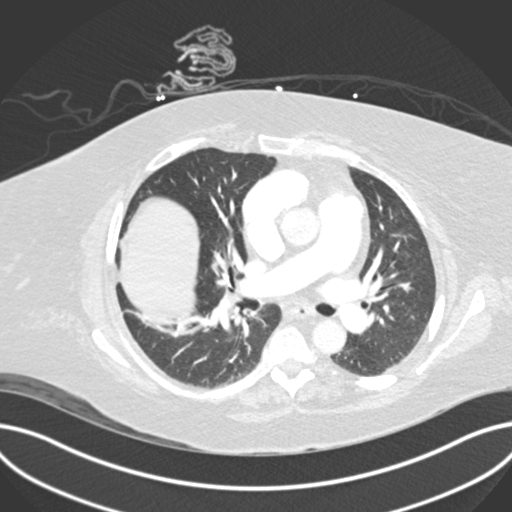
[im 221/323  soft-tissue]
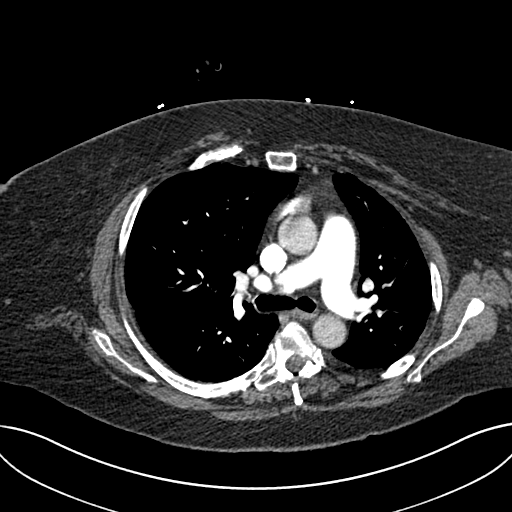
[im 255/323  lung]
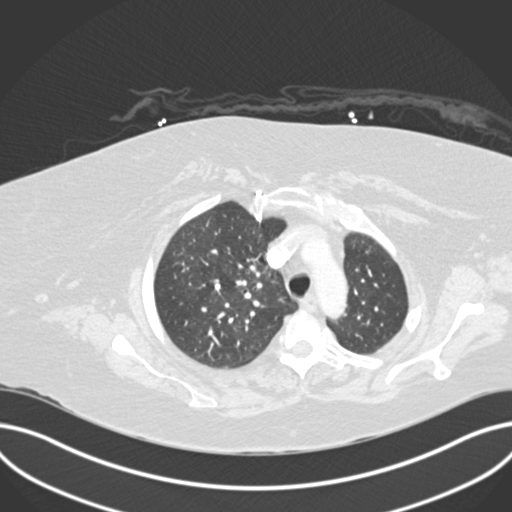
[im 272/323  soft-tissue]
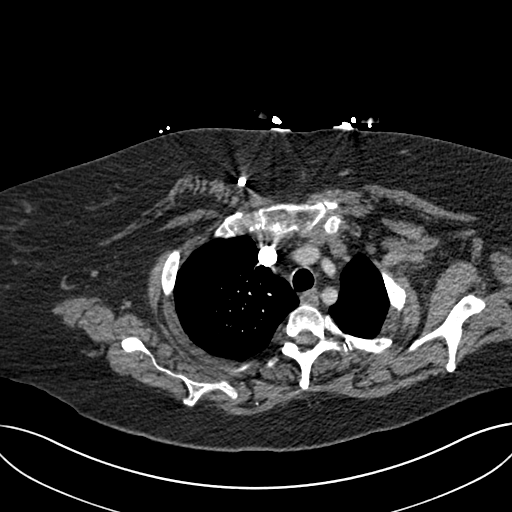
[im 289/323  lung]
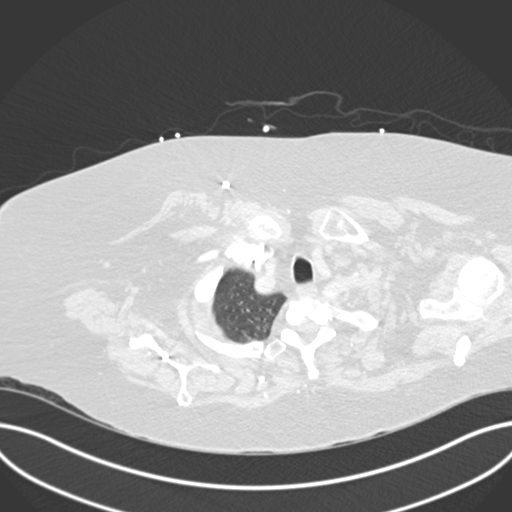
[im 306/323  soft-tissue]
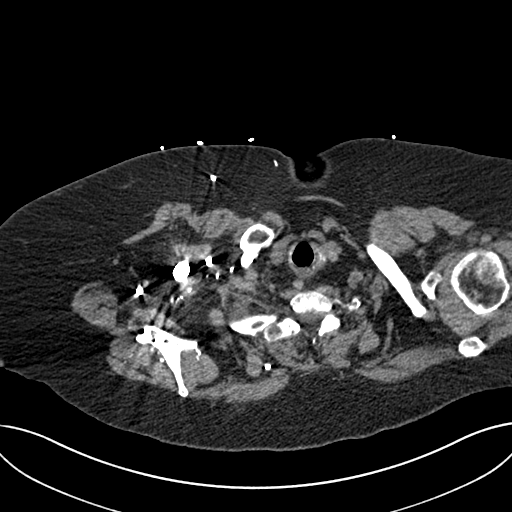

[Series 8: coronal mpr · coronal · 0.67mm/px · 2 of 95 slices shown]
[im 32/95  soft-tissue]
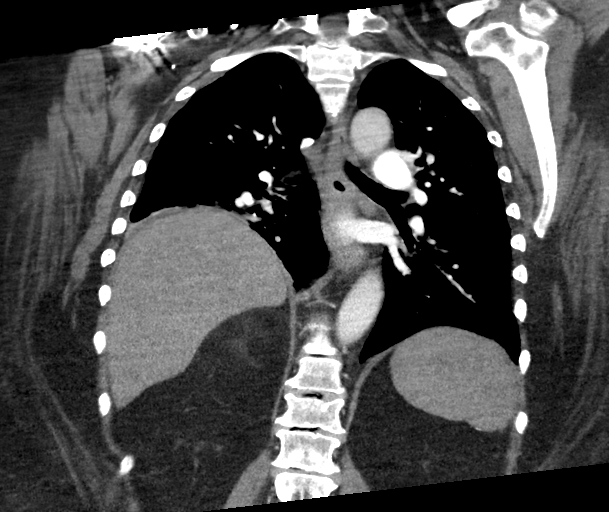
[im 63/95  soft-tissue]
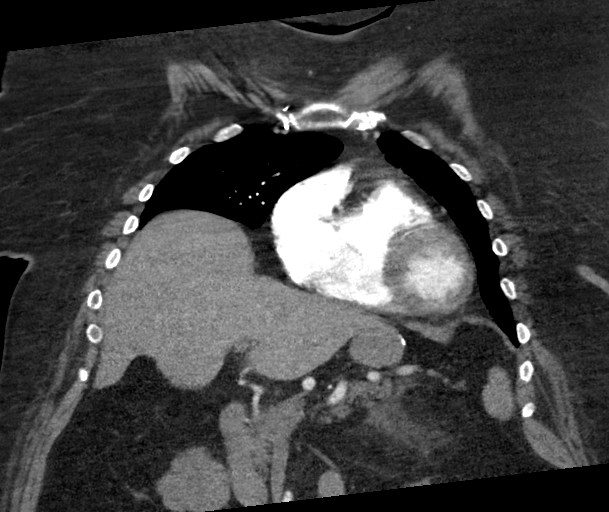

[18 of 46 positions shown; findings below may reference images not displayed]

FINDINGS: Cardiovascular: There are no filling defects within the pulmonary
arteries to suggest pulmonary embolus. Mild multi chamber
cardiomegaly. Mitral annulus calcifications again seen. Tortuous
thoracic aorta without aneurysm or dissection. No pericardial
effusion.

Mediastinum/Nodes: No mediastinal or hilar adenopathy. The esophagus
is decompressed, small hiatal hernia. Trachea and mainstem bronchi
are patent.

Lungs/Pleura: Again seen eventration of right hemidiaphragm with
adjacent atelectasis of the right middle and lower lobes. Minimal
dependent left lower lobe atelectasis. No pulmonary edema, pleural
effusion or focal consolidation. No pleural fluid.

Upper Abdomen: The liver is prominent size. Postcholecystectomy.
Post gastric surgery. Bilateral fat containing adrenal masses
consist with myelolipomas, unchanged from [DATE] MRI.

Musculoskeletal: Degenerative change in the spine. Mild compression
deformity at the T12 is chronic and unchanged from September 2015 chest
CT. No acute abnormality.

Review of the MIP images confirms the above findings.
IMPRESSION: 1. No pulmonary embolus or acute abnormality.
2. Stable chronic elevation of right hemidiaphragm with middle and
lower lobe atelectasis.
3. Additional chronic findings as described.

## 2018-12-19 ENCOUNTER — Telehealth: Payer: Self-pay | Admitting: Surgical

## 2018-12-19 DIAGNOSIS — N183 Chronic kidney disease, stage 3 (moderate): Secondary | ICD-10-CM | POA: Diagnosis not present

## 2018-12-19 DIAGNOSIS — E1129 Type 2 diabetes mellitus with other diabetic kidney complication: Secondary | ICD-10-CM | POA: Diagnosis not present

## 2018-12-19 DIAGNOSIS — N390 Urinary tract infection, site not specified: Secondary | ICD-10-CM | POA: Diagnosis not present

## 2018-12-19 DIAGNOSIS — I10 Essential (primary) hypertension: Secondary | ICD-10-CM | POA: Diagnosis not present

## 2018-12-19 NOTE — Telephone Encounter (Signed)
lvm to confirm patient appt on 12/19/18

## 2018-12-20 ENCOUNTER — Ambulatory Visit: Payer: PPO | Admitting: Surgical

## 2018-12-20 ENCOUNTER — Ambulatory Visit: Payer: PPO | Admitting: Infectious Diseases

## 2018-12-26 ENCOUNTER — Other Ambulatory Visit: Payer: Self-pay

## 2018-12-26 ENCOUNTER — Encounter: Payer: Self-pay | Admitting: Nurse Practitioner

## 2018-12-26 ENCOUNTER — Ambulatory Visit: Payer: PPO | Admitting: Nurse Practitioner

## 2018-12-26 VITALS — BP 157/83 | HR 92 | Temp 97.3°F | Ht 65.0 in

## 2018-12-26 DIAGNOSIS — L97522 Non-pressure chronic ulcer of other part of left foot with fat layer exposed: Secondary | ICD-10-CM

## 2018-12-26 NOTE — Progress Notes (Signed)
Patient ID: [name], female    DOB: [birth year], 68 y.o.   MRN: [id number]   Chief Complaint  Patient presents with  . Follow-up    2 weeks for skin ulcer of (L) foot    [name] is a 68 yo female who presents for follow up after undergoin excision of left foot wound with Acell placement on 11/09/18. Patient believes the wound "looks better" than 2 weeks ago. Patient states she has had more pain with walking over the pats 3 days. Patient states she has been more active recently. She is preparing for her granddaughter's wedding in 2 weeks. Patient states she will take an occasional vicodin for pain. The blister on her left ankle has popped and is being covered with a bandaid. .   Review of Systems  Constitutional: Positive for activity change.       More active recently  HENT: Negative.   Respiratory: Negative.   Cardiovascular: Negative.   Gastrointestinal: Negative.   Genitourinary: Negative.   Musculoskeletal:       Soreness on left foot wound  Skin: Positive for wound.  Neurological: Negative.     Past Medical History:  Diagnosis Date  . Abnormal antibody titer   . Anxiety and depression   . Arthritis   . Asthma   . Cystocele   . Depression   . Diabetes ([place])   . DVT (deep venous thrombosis) (H[facility])    Righ calf  . Dyspnea    11/08/2018 - "more now due to lack of exercise"  . Dysrhythmia    afib  . Edema   . GERD (gastroesophageal reflux disease)   . Heart murmur    Echocardiogram June 2015: Mild MR (possible vegetation seen on TTE, not seen on TEE), normal LV size with moderate concentric LVH. Normal function EF 60-65%. Normal diastolic function. Mild LA dilation.  Marland Kitchen History of IBS   . History of kidney stones   . History of methicillin resistant staphylococcus aureus (MRSA) 2008  . Hyperlipidemia   . Hypertension   . Hypothyroidism   . Insomnia   . Kidney stone    kidney stones with lithotripsy .  [facility]- "adrenal glands"  . Neuropathy  involving both lower extremities   . Obesity, Class II, BMI 35-39.9, with comorbidity   . Paroxysmal atrial fibrillation ([place]) 2007   In June 2015, Cardiac Event Monitor: Mostly SR/sinus arrhythmia with PVCs that are frequent. Short bursts of A. fib lasting several minutes;; CHA2DS2-VASc Score = 5 (age, Female, PVD, DM, HTN)  . Peripheral vascular disease ([place])   . Sleep apnea    use C-PAP  . Urinary incontinence   . Venous stasis dermatitis of both lower extremities     Past Surgical History:  Procedure Laterality Date  . ANKLE SURGERY Right   . APPLICATION OF WOUND VAC Left 06/27/2015   Procedure: APPLICATION OF WOUND VAC ( POSSIBLE ) ;  Surgeon: [name], MD;  Location: [facility];  Service: Vascular;  Laterality: Left;  . APPLICATION OF WOUND VAC Left 09/25/2016   Procedure: APPLICATION OF WOUND VAC;  Surgeon: [name], DPM;  Location: [facility];  Service: Podiatry;  Laterality: Left;  . arm surgery     right    . CHOLECYSTECTOMY    . COLONOSCOPY    . COLONOSCOPY WITH PROPOFOL N/A 01/11/2017   Procedure: COLONOSCOPY WITH PROPOFOL;  Surgeon: [name], MD;  Location: [facility];  Service: Endoscopy;  Laterality:  N/A;  . COLONOSCOPY WITH PROPOFOL N/A 02/22/2017   Procedure: COLONOSCOPY WITH PROPOFOL;  Surgeon: [name], MD;  Location: [facility] ENDOSCOPY;  Service: Endoscopy;  Laterality: N/A;  . COLONOSCOPY WITH PROPOFOL N/A 05/31/2017   Procedure: COLONOSCOPY WITH PROPOFOL;  Surgeon: [name], MD;  Location: [facility] ENDOSCOPY;  Service: Endoscopy;  Laterality: N/A;  . ESOPHAGOGASTRODUODENOSCOPY (EGD) WITH PROPOFOL N/A 01/11/2017   Procedure: ESOPHAGOGASTRODUODENOSCOPY (EGD) WITH PROPOFOL;  Surgeon: [name], MD;  Location: [facility] ENDOSCOPY;  Service: Endoscopy;  Laterality: N/A;  . ESOPHAGOGASTRODUODENOSCOPY (EGD) WITH PROPOFOL N/A 10/25/2018   Procedure: ESOPHAGOGASTRODUODENOSCOPY (EGD) WITH PROPOFOL;  Surgeon: [name], MD;  Location:  [facility] ENDOSCOPY;  Service: Endoscopy;  Laterality: N/A;  . HERNIA REPAIR     umbilical  . HIATAL HERNIA REPAIR    . I&D EXTREMITY Left 06/27/2015   Procedure: IRRIGATION AND DEBRIDEMENT EXTREMITY            ( CALF HEMATOMA ) POSSIBLE WOUND VAC;  Surgeon: [name], MD;  Location: [facility];  Service: Vascular;  Laterality: Left;  . INCISION AND DRAINAGE OF WOUND Left 09/25/2016   Procedure: IRRIGATION AND DEBRIDEMENT - PARTIAL RESECTION OF ACHILLES TENDON WITH WOUND VAC APPLICATION;  Surgeon: [name], DPM;  Location: [facility];  Service: Podiatry;  Laterality: Left;  . INCISION AND DRAINAGE OF WOUND Left 11/09/2018   Procedure: Excision of left foot wound with ACell placement;  Surgeon: Wallace Going, DO;  Location: [place];  Service: Plastics;  Laterality: Left;  . IRRIGATION AND DEBRIDEMENT ABSCESS Left 09/07/2016   Procedure: IRRIGATION AND DEBRIDEMENT ABSCESS LEFT HEEL;  Surgeon: [name], DPM;  Location: [facility];  Service: Podiatry;  Laterality: Left;  . JOINT REPLACEMENT  2013   left knee replacement  . KIDNEY SURGERY Right    kidney stones  . LITHOTRIPSY    . LOWER EXTREMITY ANGIOGRAPHY Left 04/04/2018   Procedure: LOWER EXTREMITY ANGIOGRAPHY;  Surgeon: [name], MD;  Location: [place] CV LAB;  Service: Cardiovascular;  Laterality: Left;  . NM GATED MYOVIEW ([place] HX)  February 2017   Likely breast attenuation. LOW RISK study. Normal EF 55-60%.  . OTHER SURGICAL HISTORY     08/2016 or 09/2016 surgery on achilles tenso h/o staph infection heal. Dr. [name]  . removal of left hematoma Left    leg  . REVERSE SHOULDER ARTHROPLASTY Right 10/08/2015   Procedure: REVERSE SHOULDER ARTHROPLASTY;  Surgeon: [name], MD;  Location: [facility];  Service: Orthopedics;  Laterality: Right;  . SLEEVE GASTROPLASTY    . TONSILLECTOMY    . TOTAL KNEE ARTHROPLASTY Left   . TOTAL SHOULDER REPLACEMENT Right   . TRANSESOPHAGEAL ECHOCARDIOGRAM  08/08/2013   Mild LVH, EF 60-65%.  Moderate LA dilation and mild RA dilation. Mild MR with no evidence of stenosis and no evidence of endocarditis. A false chordae is noted.  . TRANSTHORACIC ECHOCARDIOGRAM  08/03/2013   Mild-Moderate concentric LVH, EF 60-65%. Normal diastolic function. Mild LA dilation. Mild MR with possible vegetation  - not confirmed on TEE   . ULNAR NERVE TRANSPOSITION Right 08/06/2016   Procedure: ULNAR NERVE DECOMPRESSION/TRANSPOSITION;  Surgeon: [name], MD;  Location: [facility];  Service: Orthopedics;  Laterality: Right;  . UPPER GI ENDOSCOPY    . VAGINAL HYSTERECTOMY        Current Outpatient Medications:  .  albuterol (PROVENTIL HFA;VENTOLIN HFA) 108 (90 Base) MCG/ACT inhaler, Inhale 1-2 puffs into the lungs every 4 (four) hours as needed  for wheezing or shortness of breath. (Patient taking differently: Inhale 2 puffs into the lungs every 4 (four) hours as needed for wheezing or shortness of breath. ), Disp: 8 g, Rfl: 2 .  albuterol (PROVENTIL) (2.5 MG/3ML) 0.083% nebulizer solution, Take 3 mLs (2.5 mg total) by nebulization every 6 (six) hours as needed for wheezing or shortness of breath., Disp: 150 mL, Rfl: 1 .  ALPRAZolam (XANAX) 0.5 MG tablet, Take 1 tablet (0.5 mg total) by mouth 2 (two) times daily as needed for anxiety., Disp: 60 tablet, Rfl: 2 .  amLODipine (NORVASC) 5 MG tablet, Take 1 tablet (5 mg total) by mouth daily., Disp: 90 tablet, Rfl: 3 .  buPROPion (WELLBUTRIN XL) 150 MG 24 hr tablet, Take 1 tablet (150 mg total) by mouth daily., Disp: 90 tablet, Rfl: 3 .  clobetasol cream (TEMOVATE) [id number] %, Apply 1 application topically 2 (two) times daily. To arms (Patient taking differently: Apply 1 application topically 2 (two) times daily as needed (irritation). To arms), Disp: 60 g, Rfl: 1 .  dabigatran (PRADAXA) 150 MG CAPS capsule, Take 1 capsule (150 mg total) by mouth 2 (two) times daily., Disp: 180 capsule, Rfl: 1 .  Dexlansoprazole (DEXILANT) 30 MG capsule, Take 1 capsule (30 mg total)  by mouth daily. Note lower maintenance dose take 30 min before food lunch or dinner not with thyroid medication, Disp: 90 capsule, Rfl: 3 .  dexlansoprazole (DEXILANT) 60 MG capsule, Take 60 mg by mouth daily before breakfast., Disp: , Rfl:  .  DULoxetine (CYMBALTA) 60 MG capsule, Take 1 capsule (60 mg total) by mouth daily., Disp: 90 capsule, Rfl: 0 .  estradiol (ESTRACE) 0.1 MG/GM vaginal cream, Apply 0.5mg  (pea-sized amount)  just inside the vaginal introitus with a finger-tip on Monday, Wednesday and Friday nights, (Patient taking differently: Place 1 Applicatorful vaginally every Monday, Wednesday, and Friday. Apply 0.5mg  (pea-sized amount)  just inside the vaginal introitus with a finger-tip on Monday, Wednesday and Friday nights,), Disp: 42.5 g, Rfl: 11 .  fesoterodine (TOVIAZ) 8 MG TB24 tablet, Take 1 tablet (8 mg total) by mouth daily., Disp: 90 tablet, Rfl: 3 .  fluticasone-salmeterol (ADVAIR HFA) 115-21 MCG/ACT inhaler, Inhale 2 puffs into the lungs daily. Rinse mouth thoroughly after use, Disp: 1 Inhaler, Rfl: 12 .  hydrochlorothiazide (HYDRODIURIL) 12.5 MG tablet, Take 1 tablet (12.5 mg total) by mouth daily. In am, Disp: 90 tablet, Rfl: 3 .  [START ON 02/03/2019] HYDROcodone-acetaminophen (NORCO/VICODIN) 5-325 MG tablet, Take 1 tablet by mouth every 8 (eight) hours as needed for severe pain. Must last 30 days, Disp: 90 tablet, Rfl: 0 .  levocetirizine (XYZAL) 5 MG tablet, Take 1 tablet (5 mg total) by mouth every evening., Disp: 90 tablet, Rfl: 3 .  levothyroxine (SYNTHROID, LEVOTHROID) 175 MCG tablet, Take 1 tablet (175 mcg total) by mouth daily before breakfast., Disp: 90 tablet, Rfl: 3 .  losartan (COZAAR) 50 MG tablet, Take 1 tablet (50 mg total) by mouth daily. Do not take quinapril 40, Disp: 90 tablet, Rfl: 3 .  meloxicam (MOBIC) 15 MG tablet, Take 1 tablet (15 mg total) by mouth daily as needed for pain., Disp: 90 tablet, Rfl: 0 .  metFORMIN (GLUCOPHAGE) 1000 MG tablet, Take 1 tablet  (1,000 mg total) by mouth 2 (two) times daily. With food, Disp: 180 tablet, Rfl: 3 .  metoprolol succinate (TOPROL-XL) 50 MG 24 hr tablet, Take 1 tablet (50 mg total) by mouth daily. PLEASE CALL [phone] TO SCHEDULE APPOINTMENT FOR FURTHER REFILLS., Disp:  30 tablet, Rfl: 0 .  montelukast (SINGULAIR) 10 MG tablet, Take 1 tablet (10 mg total) by mouth daily. (Patient taking differently: Take 10 mg by mouth at bedtime. ), Disp: 90 tablet, Rfl: 3 .  mupirocin ointment (BACTROBAN) 2 %, Apply 1 application topically 2 (two) times daily. Skin wounds (Patient taking differently: Apply 1 application topically 2 (two) times daily as needed (skin wounds). ), Disp: 30 g, Rfl: 0 .  nystatin cream (MYCOSTATIN), Apply 1 application topically 2 (two) times daily. Mouth prn for 7 days as needed (Patient taking differently: Apply 1 application topically 2 (two) times daily as needed for dry skin. Mouth prn for 7 days as needed), Disp: 30 g, Rfl: 11 .  ondansetron (ZOFRAN) 4 MG tablet, Take 1 tablet (4 mg total) by mouth every 8 (eight) hours as needed for nausea or vomiting., Disp: 30 tablet, Rfl: 0 .  oxybutynin (DITROPAN XL) 15 MG 24 hr tablet, TAKE ONE TABLET BY MOUTH AT BEDTIME (Patient taking differently: Take 15 mg by mouth at bedtime. ), Disp: 90 tablet, Rfl: 3 .  phenazopyridine (PYRIDIUM) 200 MG tablet, Take 1 tablet (200 mg total) by mouth 3 (three) times daily as needed for pain., Disp: 20 tablet, Rfl: 0 .  pregabalin (LYRICA) 50 MG capsule, Take 1 capsule (50 mg total) by mouth 3 (three) times daily., Disp: 90 capsule, Rfl: 2 .  sucralfate (CARAFATE) 1 GM/10ML suspension, Take 1 g by mouth 2 (two) times daily before a meal., Disp: , Rfl:  .  zinc oxide 20 % ointment, Apply 1 application topically as needed for irritation., Disp: 56.7 g, Rfl: 0  Current Facility-Administered Medications:  .  ipratropium-albuterol (DUONEB) 0.5-2.5 (3) MG/3ML nebulizer solution 3 mL, 3 mL, Nebulization, Q6H, [name],  [name], MD   Objective:   Vitals:   12/26/18 1037  BP: (!) 157/83  Pulse: 92  Temp: (!) 97.3 F (36.3 C)  SpO2: 94%    Physical Exam  General: alert, calm, obese, no acute distress HEENT:normocephalic Neck: supple, full ROM Chest: symmetrical rise and fall Lungs: unlabored breathing Cardiac: +2 bilateral radial pulse Musculoskeletal: MAEx4  Neuro: A&O x3, calm, cooperative, steady gait with walker Skin: 1.4 cm x 1.4 cm circular open wound with granulating tissue, no surrounding erythema      Assessment & Plan:  Skin ulcer of left foot with fat layer exposed ([place])  [name] is a 68 yo female s/p excision of left foot wound with Acell placement on 11/09/18. The wound bed is granulating and slowly improving. Donated Acell, Adaptic, K-Y jelly, and gauze were placed on the wound today. The foot and ankle was wrapped with Kerlix and an Ace bandage. She can shower Friday. She will continue daily dressing changes with KY gel and gauze. Return in 3 weeks or sooner as needed.   Picture placed in chart with patient's consent.  Alfredo Batty, NP

## 2018-12-27 ENCOUNTER — Other Ambulatory Visit: Payer: Self-pay | Admitting: Nephrology

## 2018-12-27 DIAGNOSIS — N183 Chronic kidney disease, stage 3 unspecified: Secondary | ICD-10-CM

## 2018-12-28 DIAGNOSIS — R1310 Dysphagia, unspecified: Secondary | ICD-10-CM | POA: Diagnosis not present

## 2018-12-30 ENCOUNTER — Encounter: Payer: Self-pay | Admitting: Cardiovascular Disease

## 2018-12-30 ENCOUNTER — Telehealth (INDEPENDENT_AMBULATORY_CARE_PROVIDER_SITE_OTHER): Payer: PPO | Admitting: Cardiovascular Disease

## 2018-12-30 ENCOUNTER — Other Ambulatory Visit: Payer: Self-pay

## 2018-12-30 VITALS — BP 157/80 | HR 57 | Ht 65.5 in | Wt 258.0 lb

## 2018-12-30 DIAGNOSIS — I480 Paroxysmal atrial fibrillation: Secondary | ICD-10-CM | POA: Diagnosis not present

## 2018-12-30 DIAGNOSIS — I10 Essential (primary) hypertension: Secondary | ICD-10-CM

## 2018-12-30 NOTE — Patient Instructions (Signed)
Medication Instructions:  Continue same medications If you need a refill on your cardiac medications before your next appointment, please call your pharmacy.   Lab work: None If you have labs (blood work) drawn today and your tests are completely normal, you will receive your results only by: . MyChart Message (if you have MyChart) OR . A paper copy in the mail If you have any lab test that is abnormal or we need to change your treatment, we will call you to review the results.  Testing/Procedures: None  Follow-Up: At [facility], you and your health needs are our priority.  As part of our continuing mission to provide you with exceptional heart care, we have created designated Provider Care Teams.  These Care Teams include your primary Cardiologist (physician) and Advanced Practice Providers (APPs -  Physician Assistants and Nurse Practitioners) who all work together to provide you with the care you need, when you need it. You will need a follow up appointment in 6 months.  Please call our office 2 months in advance to schedule this appointment.  You may see Muhammad Arida, MD or one of the following Advanced Practice Providers on your designated Care Team:   Christopher Berge, NP Ryan Dunn, PA-C . Jacquelyn Visser, PA-C   

## 2018-12-30 NOTE — Progress Notes (Signed)
Virtual Visit via Telephone Note   This visit type was conducted due to national recommendations for restrictions regarding the COVID-19 Pandemic (e.g. social distancing) in an effort to limit this patient's exposure and mitigate transmission in our community.  Due to her co-morbid illnesses, this patient is at least at moderate risk for complications without adequate follow up.  This format is felt to be most appropriate for this patient at this time.  The patient did not have access to video technology/had technical difficulties with video requiring transitioning to audio format only (telephone).  All issues noted in this document were discussed and addressed.  No physical exam could be performed with this format.  Please refer to the patient's chart for her  consent to telehealth for [facility].   Date:  12/30/2018   ID:  [name], DOB [birth year], MRN [id number]  Patient Location: Home Provider Location: Office  PCP:  [name], [name], MD  Cardiologist:  Kathlyn Sacramento, MD  Electrophysiologist:  None   Evaluation Performed:  Follow-Up Visit  Chief Complaint: Follow-up visit  History of Present Illness:    [name] is a 68 y.o. female who was reached via phone for follow-up visit regarding paroxysmal atrial fibrillation She has chronic medical conditions that include diabetes mellitus, hypertension, hyperlipidemia, obesity and previous DVT. She also has sleep apnea and has been on CPAP for many years.   She has chronic left foot ulceration.  There was no evidence of peripheral arterial disease on noninvasive Doppler as well as an angiogram done earlier this year by Dr. Lucky Cowboy.  She had debridement done with subsequent improvement. She had atypical chest pain during last visit and underwent a Lexiscan Myoview which was normal.  She has been doing reasonably well with no recurrent chest pain, palpitations or dyspnea.  The patient does not have symptoms concerning  for COVID-19 infection (fever, chills, cough, or new shortness of breath).    Past Medical History:  Diagnosis Date   Abnormal antibody titer    Anxiety and depression    Arthritis    Asthma    Cystocele    Depression    Diabetes ([place])    DVT (deep venous thrombosis) (HCC)    Righ calf   Dyspnea    11/08/2018 - "more now due to lack of exercise"   Dysrhythmia    afib   Edema    GERD (gastroesophageal reflux disease)    Heart murmur    Echocardiogram June 2015: Mild MR (possible vegetation seen on TTE, not seen on TEE), normal LV size with moderate concentric LVH. Normal function EF 60-65%. Normal diastolic function. Mild LA dilation.   History of IBS    History of kidney stones    History of methicillin resistant staphylococcus aureus (MRSA) 2008   Hyperlipidemia    Hypertension    Hypothyroidism    Insomnia    Kidney stone    kidney stones with lithotripsy .  [facility]- "adrenal glands"   Neuropathy involving both lower extremities    Obesity, Class II, BMI 35-39.9, with comorbidity    Paroxysmal atrial fibrillation ([place]) 2007   In June 2015, Cardiac Event Monitor: Mostly SR/sinus arrhythmia with PVCs that are frequent. Short bursts of A. fib lasting several minutes;; CHA2DS2-VASc Score = 5 (age, Female, PVD, DM, HTN)   Peripheral vascular disease (HCC)    Sleep apnea    use C-PAP   Urinary incontinence    Venous stasis dermatitis of both lower  extremities    Past Surgical History:  Procedure Laterality Date   ANKLE SURGERY Right    APPLICATION OF WOUND VAC Left 06/27/2015   Procedure: APPLICATION OF WOUND VAC ( POSSIBLE ) ;  Surgeon: [name], MD;  Location: [facility] ORS;  Service: Vascular;  Laterality: Left;   APPLICATION OF WOUND VAC Left 09/25/2016   Procedure: APPLICATION OF WOUND VAC;  Surgeon: [name], DPM;  Location: [facility];  Service: Podiatry;  Laterality: Left;   arm surgery     right     CHOLECYSTECTOMY      COLONOSCOPY     COLONOSCOPY WITH PROPOFOL N/A 01/11/2017   Procedure: COLONOSCOPY WITH PROPOFOL;  Surgeon: [name], MD;  Location: [facility] ENDOSCOPY;  Service: Endoscopy;  Laterality: N/A;   COLONOSCOPY WITH PROPOFOL N/A 02/22/2017   Procedure: COLONOSCOPY WITH PROPOFOL;  Surgeon: [name], MD;  Location: [facility] ENDOSCOPY;  Service: Endoscopy;  Laterality: N/A;   COLONOSCOPY WITH PROPOFOL N/A 05/31/2017   Procedure: COLONOSCOPY WITH PROPOFOL;  Surgeon: [name], MD;  Location: [facility] ENDOSCOPY;  Service: Endoscopy;  Laterality: N/A;   ESOPHAGOGASTRODUODENOSCOPY (EGD) WITH PROPOFOL N/A 01/11/2017   Procedure: ESOPHAGOGASTRODUODENOSCOPY (EGD) WITH PROPOFOL;  Surgeon: [name], MD;  Location: [facility] ENDOSCOPY;  Service: Endoscopy;  Laterality: N/A;   ESOPHAGOGASTRODUODENOSCOPY (EGD) WITH PROPOFOL N/A 10/25/2018   Procedure: ESOPHAGOGASTRODUODENOSCOPY (EGD) WITH PROPOFOL;  Surgeon: [name], MD;  Location: [facility] ENDOSCOPY;  Service: Endoscopy;  Laterality: N/A;   HERNIA REPAIR     umbilical   HIATAL HERNIA REPAIR     I&D EXTREMITY Left 06/27/2015   Procedure: IRRIGATION AND DEBRIDEMENT EXTREMITY            ( CALF HEMATOMA ) POSSIBLE WOUND VAC;  Surgeon: [name], MD;  Location: [facility] ORS;  Service: Vascular;  Laterality: Left;   INCISION AND DRAINAGE OF WOUND Left 09/25/2016   Procedure: IRRIGATION AND DEBRIDEMENT - PARTIAL RESECTION OF ACHILLES TENDON WITH WOUND VAC APPLICATION;  Surgeon: [name], DPM;  Location: [facility];  Service: Podiatry;  Laterality: Left;   INCISION AND DRAINAGE OF WOUND Left 11/09/2018   Procedure: Excision of left foot wound with ACell placement;  Surgeon: Wallace Going, DO;  Location: [place];  Service: Plastics;  Laterality: Left;   IRRIGATION AND DEBRIDEMENT ABSCESS Left 09/07/2016   Procedure: IRRIGATION AND DEBRIDEMENT ABSCESS LEFT HEEL;  Surgeon: [name], DPM;  Location: [facility] ORS;  Service: Podiatry;   Laterality: Left;   JOINT REPLACEMENT  2013   left knee replacement   KIDNEY SURGERY Right    kidney stones   LITHOTRIPSY     LOWER EXTREMITY ANGIOGRAPHY Left 04/04/2018   Procedure: LOWER EXTREMITY ANGIOGRAPHY;  Surgeon: [name], MD;  Location: [place] [facility];  Service: Cardiovascular;  Laterality: Left;   NM GATED MYOVIEW [facility] HX)  February 2017   Likely breast attenuation. LOW RISK study. Normal EF 55-60%.   OTHER SURGICAL HISTORY     08/2016 or 09/2016 surgery on achilles tenso h/o staph infection heal. Dr. [name]   removal of left hematoma Left    leg   REVERSE SHOULDER ARTHROPLASTY Right 10/08/2015   Procedure: REVERSE SHOULDER ARTHROPLASTY;  Surgeon: [name], MD;  Location: [facility] ORS;  Service: Orthopedics;  Laterality: Right;   SLEEVE GASTROPLASTY     TONSILLECTOMY     TOTAL KNEE ARTHROPLASTY Left    TOTAL SHOULDER REPLACEMENT Right    TRANSESOPHAGEAL ECHOCARDIOGRAM  08/08/2013   Mild LVH,  EF 60-65%. Moderate LA dilation and mild RA dilation. Mild MR with no evidence of stenosis and no evidence of endocarditis. A false chordae is noted.   TRANSTHORACIC ECHOCARDIOGRAM  08/03/2013   Mild-Moderate concentric LVH, EF 60-65%. Normal diastolic function. Mild LA dilation. Mild MR with possible vegetation  - not confirmed on TEE    ULNAR NERVE TRANSPOSITION Right 08/06/2016   Procedure: ULNAR NERVE DECOMPRESSION/TRANSPOSITION;  Surgeon: [name], MD;  Location: [facility] ORS;  Service: Orthopedics;  Laterality: Right;   UPPER GI ENDOSCOPY     VAGINAL HYSTERECTOMY       Current Meds  Medication Sig   albuterol (PROVENTIL HFA;VENTOLIN HFA) 108 (90 Base) MCG/ACT inhaler Inhale 1-2 puffs into the lungs every 4 (four) hours as needed for wheezing or shortness of breath. (Patient taking differently: Inhale 2 puffs into the lungs every 4 (four) hours as needed for wheezing or shortness of breath. )   albuterol (PROVENTIL) (2.5 MG/3ML) 0.083% nebulizer solution  Take 3 mLs (2.5 mg total) by nebulization every 6 (six) hours as needed for wheezing or shortness of breath.   ALPRAZolam (XANAX) 0.5 MG tablet Take 1 tablet (0.5 mg total) by mouth 2 (two) times daily as needed for anxiety.   amLODipine (NORVASC) 5 MG tablet Take 1 tablet (5 mg total) by mouth daily.   buPROPion (WELLBUTRIN XL) 150 MG 24 hr tablet Take 1 tablet (150 mg total) by mouth daily.   clobetasol cream (TEMOVATE) [id number] % Apply 1 application topically 2 (two) times daily. To arms (Patient taking differently: Apply 1 application topically 2 (two) times daily as needed (irritation). To arms)   dabigatran (PRADAXA) 150 MG CAPS capsule Take 1 capsule (150 mg total) by mouth 2 (two) times daily.   Dexlansoprazole (DEXILANT) 30 MG capsule Take 1 capsule (30 mg total) by mouth daily. Note lower maintenance dose take 30 min before food lunch or dinner not with thyroid medication   dexlansoprazole (DEXILANT) 60 MG capsule Take 60 mg by mouth daily before breakfast.   DULoxetine (CYMBALTA) 60 MG capsule Take 1 capsule (60 mg total) by mouth daily.   estradiol (ESTRACE) 0.1 MG/GM vaginal cream Apply 0.5mg  (pea-sized amount)  just inside the vaginal introitus with a finger-tip on Monday, Wednesday and Friday nights, (Patient taking differently: Place 1 Applicatorful vaginally every Monday, Wednesday, and Friday. Apply 0.5mg  (pea-sized amount)  just inside the vaginal introitus with a finger-tip on Monday, Wednesday and Friday nights,)   fesoterodine (TOVIAZ) 8 MG TB24 tablet Take 1 tablet (8 mg total) by mouth daily.   fluticasone-salmeterol (ADVAIR HFA) 115-21 MCG/ACT inhaler Inhale 2 puffs into the lungs daily. Rinse mouth thoroughly after use   hydrochlorothiazide (HYDRODIURIL) 12.5 MG tablet Take 1 tablet (12.5 mg total) by mouth daily. In am   [START ON 02/03/2019] HYDROcodone-acetaminophen (NORCO/VICODIN) 5-325 MG tablet Take 1 tablet by mouth every 8 (eight) hours as needed for severe  pain. Must last 30 days   levocetirizine (XYZAL) 5 MG tablet Take 1 tablet (5 mg total) by mouth every evening.   levothyroxine (SYNTHROID, LEVOTHROID) 175 MCG tablet Take 1 tablet (175 mcg total) by mouth daily before breakfast.   losartan (COZAAR) 50 MG tablet Take 1 tablet (50 mg total) by mouth daily. Do not take quinapril 40   metFORMIN (GLUCOPHAGE) 1000 MG tablet Take 1 tablet (1,000 mg total) by mouth 2 (two) times daily. With food   metoprolol succinate (TOPROL-XL) 50 MG 24 hr tablet Take 1 tablet (50 mg total) by  mouth daily. PLEASE CALL [phone] TO SCHEDULE APPOINTMENT FOR FURTHER REFILLS.   montelukast (SINGULAIR) 10 MG tablet Take 1 tablet (10 mg total) by mouth daily. (Patient taking differently: Take 10 mg by mouth at bedtime. )   mupirocin ointment (BACTROBAN) 2 % Apply 1 application topically 2 (two) times daily. Skin wounds (Patient taking differently: Apply 1 application topically 2 (two) times daily as needed (skin wounds). )   nystatin cream (MYCOSTATIN) Apply 1 application topically 2 (two) times daily. Mouth prn for 7 days as needed (Patient taking differently: Apply 1 application topically 2 (two) times daily as needed for dry skin. Mouth prn for 7 days as needed)   ondansetron (ZOFRAN) 4 MG tablet Take 1 tablet (4 mg total) by mouth every 8 (eight) hours as needed for nausea or vomiting.   oxybutynin (DITROPAN XL) 15 MG 24 hr tablet TAKE ONE TABLET BY MOUTH AT BEDTIME (Patient taking differently: Take 15 mg by mouth at bedtime. )   phenazopyridine (PYRIDIUM) 200 MG tablet Take 1 tablet (200 mg total) by mouth 3 (three) times daily as needed for pain.   pregabalin (LYRICA) 50 MG capsule Take 1 capsule (50 mg total) by mouth 3 (three) times daily.   sucralfate (CARAFATE) 1 GM/10ML suspension Take 1 g by mouth 2 (two) times daily before a meal.   zinc oxide 20 % ointment Apply 1 application topically as needed for irritation.   Current Facility-Administered  Medications for the 12/30/18 encounter (Telemedicine) with Wellington Hampshire, MD  Medication   ipratropium-albuterol (DUONEB) 0.5-2.5 (3) MG/3ML nebulizer solution 3 mL     Allergies:   Morphine and related, Penicillins, Aspirin, and Oxytetracycline   Social History   Tobacco Use   Smoking status: Never Smoker   Smokeless tobacco: Never Used  Substance Use Topics   Alcohol use: No    Alcohol/week: 0.0 standard drinks   Drug use: Never     Family Hx: The patient's family history includes Alcohol abuse in her father; Arthritis in her maternal grandfather, maternal grandmother, mother, paternal grandfather, and paternal grandmother; Breast cancer in her maternal aunt; Cancer in her father and maternal aunt; Cerebral aneurysm in her father; Diabetes in her father, maternal grandmother, and paternal grandmother; Heart disease in her maternal grandfather; Hypertension in her father, maternal grandfather, maternal grandmother, paternal grandfather, and paternal grandmother; Kidney disease in her mother; Mental illness in her sister; Peripheral vascular disease in her father; Skin cancer in her father; Varicose Veins in her mother. There is no history of Bladder Cancer or Kidney cancer.  ROS:   Please see the history of present illness.     All other systems reviewed and are negative.   Prior CV studies:   The following studies were reviewed today:    Labs/Other Tests and Data Reviewed:    EKG:  No ECG reviewed.  Recent Labs: 07/29/2018: TSH 2.051 09/28/2018: ALT 13 11/09/2018: BUN 21; Creatinine, Ser 1.22; Hemoglobin 12.3; Platelets 283; Potassium 4.6; Sodium 140   Recent Lipid Panel Lab Results  Component Value Date/Time   CHOL 181 05/12/2018 10:28 AM   CHOL 185 10/10/2014 08:22 AM   TRIG 95.0 05/12/2018 10:28 AM   HDL 82.60 05/12/2018 10:28 AM   HDL 94 10/10/2014 08:22 AM   CHOLHDL 2 05/12/2018 10:28 AM   LDLCALC 80 05/12/2018 10:28 AM   LDLCALC 73 10/10/2014 08:22 AM     Wt Readings from Last 3 Encounters:  12/30/18 258 lb (117 kg)  12/06/18 252 lb (114.3  kg)  12/06/18 252 lb (114.3 kg)     Objective:    Vital Signs:  BP (!) 157/80    Pulse (!) 57    Ht 5' 5.5" (1.664 m)    Wt 258 lb (117 kg)    BMI 42.28 kg/m    VITAL SIGNS:  reviewed  ASSESSMENT & PLAN:    1.  Paroxysmal atrial fibrillation: Currently in normal sinus rhythm with minimal palpitations.  Continue current dose of Toprol 50 mg once daily.  She had bradycardia on higher doses.  She is tolerating anticoagulation with Pradaxa.  I reviewed her labs done in August which were stable.    2.  Atypical chest pain in the past: Most recent stress test early this year was normal.  3. Essential hypertension: Blood pressure is reasonably controlled on current medications.  4.  Slow healing foot ulcer on the left side: No evidence of peripheral arterial disease.  This has improved significantly after most recent surgery.   COVID-19 Education: The signs and symptoms of COVID-19 were discussed with the patient and how to seek care for testing (follow up with PCP or arrange E-visit).  The importance of social distancing was discussed today.  Time:   Today, I have spent 8 minutes with the patient with telehealth technology discussing the above problems.     Medication Adjustments/Labs and Tests Ordered: Current medicines are reviewed at length with the patient today.  Concerns regarding medicines are outlined above.   Tests Ordered: No orders of the defined types were placed in this encounter.   Medication Changes: No orders of the defined types were placed in this encounter.   Follow Up:  In Person in 6 month(s)  Signed, Kathlyn Sacramento, MD  12/30/2018 9:47 AM    [place]

## 2019-01-12 ENCOUNTER — Telehealth: Payer: Self-pay | Admitting: Urology

## 2019-01-12 NOTE — Telephone Encounter (Signed)
All week pt has been burning when she urinates, on the inside of bladder, it's uncomfortable.  Also, she has redness on top of her feet and wants to know if this could be urology related.  Pt says she's positive she has a UTI.

## 2019-01-12 NOTE — Telephone Encounter (Signed)
Spoke with patient and made an appointment for her to see PA tomorrow to check for UTI. She was told to follow up with PCP in regards to redness and swelling of feet. She verbalized understanding

## 2019-01-13 ENCOUNTER — Ambulatory Visit (INDEPENDENT_AMBULATORY_CARE_PROVIDER_SITE_OTHER): Payer: PPO | Admitting: Physician Assistant

## 2019-01-13 ENCOUNTER — Encounter: Payer: Self-pay | Admitting: Physician Assistant

## 2019-01-13 ENCOUNTER — Other Ambulatory Visit: Payer: Self-pay

## 2019-01-13 VITALS — BP 148/78 | HR 86 | Ht 65.0 in | Wt 252.0 lb

## 2019-01-13 DIAGNOSIS — N390 Urinary tract infection, site not specified: Secondary | ICD-10-CM | POA: Diagnosis not present

## 2019-01-13 DIAGNOSIS — R300 Dysuria: Secondary | ICD-10-CM

## 2019-01-13 LAB — MICROSCOPIC EXAMINATION: WBC, UA: >30 /hpf — AB (ref 0–5)

## 2019-01-13 LAB — URINALYSIS, COMPLETE
Bilirubin, UA: NEGATIVE
Nitrite, UA: NEGATIVE
Specific Gravity, UA: >1.03 — ABNORMAL HIGH (ref 1.005–1.030)
Urobilinogen, Ur: 0.2 mg/dL (ref 0.2–1.0)
pH, UA: 5 (ref 5.0–7.5)

## 2019-01-13 LAB — BLADDER SCAN AMB NON-IMAGING: Scan Result: 0 mL

## 2019-01-13 MED ORDER — FOSFOMYCIN TROMETHAMINE 3 G PO PACK: 3.0000 g | PACK | Freq: Once | ORAL | 0 refills | Status: AC

## 2019-01-13 NOTE — Progress Notes (Signed)
01/13/2019 1:00 PM   [name] Jan 04, 1951 [id number]  CC: Dysuria  HPI: [name] is a 68 y.o. female who presents today for evaluation of possible UTI. She is an established [facility] patient who last saw me on 12/06/2018 for the same.  Urine culture positive for Klebsiella with sensitivity pattern consistent with ESBL production.  She was treated with fosfomycin 3 g x 1 dose.  As this was her second multidrug-resistant UTI with different organisms in 1 month, I placed an urgent ID referral with instructions to go to the appointment regardless of therapeutic benefit of the fosfomycin.  She no-showed to that appointment.  She reports a 5-day history of dysuria and nocturia. She denies fevers, chills, nausea, vomiting, flank pain, and gross hematuria.  She reports being unaware that she had a scheduled ID appointment. She is frustrated with the frequency of her UTIs. She is not taking a daily probiotic.  In-office UA today positive for 2+ blood, 2+ protein, and 1+ leukocyte esterase; urine microscopy with >30 WBCs/HPF, 3-10 RBCs/HPF, and many bacteria. PVR 48mL.  PMH: Past Medical History:  Diagnosis Date  . Abnormal antibody titer   . Anxiety and depression   . Arthritis   . Asthma   . Cystocele   . Depression   . Diabetes ([place])   . DVT (deep venous thrombosis) (H[facility])    Righ calf  . Dyspnea    11/08/2018 - "more now due to lack of exercise"  . Dysrhythmia    afib  . Edema   . GERD (gastroesophageal reflux disease)   . Heart murmur    Echocardiogram June 2015: Mild MR (possible vegetation seen on TTE, not seen on TEE), normal LV size with moderate concentric LVH. Normal function EF 60-65%. Normal diastolic function. Mild LA dilation.  Marland Kitchen History of IBS   . History of kidney stones   . History of methicillin resistant staphylococcus aureus (MRSA) 2008  . Hyperlipidemia   . Hypertension   . Hypothyroidism   . Insomnia   . Kidney stone    kidney stones with lithotripsy  .  [facility]- "adrenal glands"  . Neuropathy involving both lower extremities   . Obesity, Class II, BMI 35-39.9, with comorbidity   . Paroxysmal atrial fibrillation ([place]) 2007   In June 2015, Cardiac Event Monitor: Mostly SR/sinus arrhythmia with PVCs that are frequent. Short bursts of A. fib lasting several minutes;; CHA2DS2-VASc Score = 5 (age, Female, PVD, DM, HTN)  . Peripheral vascular disease ([place])   . Sleep apnea    use C-PAP  . Urinary incontinence   . Venous stasis dermatitis of both lower extremities     Surgical History: Past Surgical History:  Procedure Laterality Date  . ANKLE SURGERY Right   . APPLICATION OF WOUND VAC Left 06/27/2015   Procedure: APPLICATION OF WOUND VAC ( POSSIBLE ) ;  Surgeon: [name], MD;  Location: [facility] ORS;  Service: Vascular;  Laterality: Left;  . APPLICATION OF WOUND VAC Left 09/25/2016   Procedure: APPLICATION OF WOUND VAC;  Surgeon: [name], DPM;  Location: [facility];  Service: Podiatry;  Laterality: Left;  . arm surgery     right    . CHOLECYSTECTOMY    . COLONOSCOPY    . COLONOSCOPY WITH PROPOFOL N/A 01/11/2017   Procedure: COLONOSCOPY WITH PROPOFOL;  Surgeon: [name], MD;  Location: [facility] ENDOSCOPY;  Service: Endoscopy;  Laterality: N/A;  . COLONOSCOPY WITH PROPOFOL N/A 02/22/2017   Procedure: COLONOSCOPY  WITH PROPOFOL;  Surgeon: [name], MD;  Location: [facility] ENDOSCOPY;  Service: Endoscopy;  Laterality: N/A;  . COLONOSCOPY WITH PROPOFOL N/A 05/31/2017   Procedure: COLONOSCOPY WITH PROPOFOL;  Surgeon: [name], MD;  Location[facility] ENDOSCOPY;  Service: Endoscopy;  Laterality: N/A;  . ESOPHAGOGASTRODUODENOSCOPY (EGD) WITH PROPOFOL N/A 01/11/2017   Procedure: ESOPHAGOGASTRODUODENOSCOPY (EGD) WITH PROPOFOL;  Surgeon: [name], MD;  Location: [facility] ENDOSCOPY;  Service: Endoscopy;  Laterality: N/A;  . ESOPHAGOGASTRODUODENOSCOPY (EGD) WITH PROPOFOL N/A 10/25/2018   Procedure:  ESOPHAGOGASTRODUODENOSCOPY (EGD) WITH PROPOFOL;  Surgeon: [name], MD;  Location: [facility] ENDOSCOPY;  Service: Endoscopy;  Laterality: N/A;  . HERNIA REPAIR     umbilical  . HIATAL HERNIA REPAIR    . I&D EXTREMITY Left 06/27/2015   Procedure: IRRIGATION AND DEBRIDEMENT EXTREMITY            ( CALF HEMATOMA ) POSSIBLE WOUND VAC;  Surgeon: [name], MD;  Location: [facility]S;  Service: Vascular;  Laterality: Left;  . INCISION AND DRAINAGE OF WOUND Left 09/25/2016   Procedure: IRRIGATION AND DEBRIDEMENT - PARTIAL RESECTION OF ACHILLES TENDON WITH WOUND VAC APPLICATION;  Surgeon: [name], DPM;  Location: [facility];  Service: Podiatry;  Laterality: Left;  . INCISION AND DRAINAGE OF WOUND Left 11/09/2018   Procedure: Excision of left foot wound with ACell placement;  Surgeon: Wallace Going, DO;  Location: [facility];  Service: Plastics;  Laterality: Left;  . IRRIGATION AND DEBRIDEMENT ABSCESS Left 09/07/2016   Procedure: IRRIGATION AND DEBRIDEMENT ABSCESS LEFT HEEL;  Surgeon: [name], DPM;  Location: [facility];  Service: Podiatry;  Laterality: Left;  . JOINT REPLACEMENT  2013   left knee replacement  . KIDNEY SURGERY Right    kidney stones  . LITHOTRIPSY    . LOWER EXTREMITY ANGIOGRAPHY Left 04/04/2018   Procedure: LOWER EXTREMITY ANGIOGRAPHY;  Surgeon: [name], MD;  Location: [facility];  Service: Cardiovascular;  Laterality: Left;  . NM GATED MYOVIEW ([place] [facility])  February 2017   Likely breast attenuation. LOW RISK study. Normal EF 55-60%.  . OTHER SURGICAL HISTORY     08/2016 or 09/2016 surgery on achilles tenso h/o staph infection heal. Dr. [name]  . removal of left hematoma Left    leg  . REVERSE SHOULDER ARTHROPLASTY Right 10/08/2015   Procedure: REVERSE SHOULDER ARTHROPLASTY;  Surgeon: [name], MD;  Location: [facility] ORS;  Service: Orthopedics;  Laterality: Right;  . SLEEVE GASTROPLASTY    . TONSILLECTOMY    . TOTAL KNEE ARTHROPLASTY Left   . TOTAL  SHOULDER REPLACEMENT Right   . TRANSESOPHAGEAL ECHOCARDIOGRAM  08/08/2013   Mild LVH, EF 60-65%. Moderate LA dilation and mild RA dilation. Mild MR with no evidence of stenosis and no evidence of endocarditis. A false chordae is noted.  . TRANSTHORACIC ECHOCARDIOGRAM  08/03/2013   Mild-Moderate concentric LVH, EF 60-65%. Normal diastolic function. Mild LA dilation. Mild MR with possible vegetation  - not confirmed on TEE   . ULNAR NERVE TRANSPOSITION Right 08/06/2016   Procedure: ULNAR NERVE DECOMPRESSION/TRANSPOSITION;  Surgeon: [name], MD;  Location: [facility];  Service: Orthopedics;  Laterality: Right;  . UPPER GI ENDOSCOPY    . VAGINAL HYSTERECTOMY      Home Medications:  Allergies as of 01/13/2019      Reactions   Morphine And Related Other (See Comments), Nausea Only   Patient becomes very confused Other reaction(s): Other (See Comments), Other (See Comments) Patient becomes very confused  Patient becomes very confused   Penicillins Hives, Shortness Of Breath, Swelling, Other (See Comments)   Facial swelling Has patient had a PCN reaction causing immediate rash, facial/tongue/throat swelling, SOB or lightheadedness with hypotension: Yes Has patient had a PCN reaction causing severe rash involving mucus membranes or skin necrosis: Yes Has patient had a PCN reaction that required hospitalization No Has patient had a PCN reaction occurring within the last 10 years: No If all of the above answers are "NO", then may proceed with Cephalosporin use. Other reaction(s): Other (See Comments), Other (See Comments) Facial swelling Has patient had a PCN reaction causing immediate rash, facial/tongue/throat swelling, SOB or lightheadedness with hypotension: Yes Has patient had a PCN reaction causing severe rash involving mucus membranes or skin necrosis: Yes Has patient had a PCN reaction that required hospitalization No Has patient had a PCN reaction occurring within the last 10 years: No  If all of the above answers are "NO", then may proceed with Cephalosporin use. Facial swelling Has patient had a PCN reaction causing immediate rash, facial/tongue/throat swelling, SOB or lightheadedness with hypotension: Yes Has patient had a PCN reaction causing severe rash involving mucus membranes or skin necrosis: Yes Has patient had a PCN reaction that required hospitalization No Has patient had a PCN reaction occurring within the last 10 years: No If all of the above answers are "NO", then may proceed with Cephalosporin use. Facial swelling Has patient had a PCN reaction causing immediate rash, facial/tongue/throat swelling, SOB or lightheadedness with hypotension: Yes Has patient had a PCN reaction causing severe rash involving mucus membranes or skin necrosis: Yes Has patient had a PCN reaction that required hospitalization No Has patient had a PCN reaction occurring within the last 10 years: No If all of the above answers are "NO", then may proceed with Cephalosporin use.   Aspirin Hives   Oxytetracycline Hives   Other reaction(s): Unknown      Medication List       Accurate as of January 13, 2019 11:59 PM. If you have any questions, ask your nurse or doctor.        albuterol (2.5 MG/3ML) 0.083% nebulizer solution Commonly known as: PROVENTIL Take 3 mLs (2.5 mg total) by nebulization every 6 (six) hours as needed for wheezing or shortness of breath. What changed: Another medication with the same name was changed. Make sure you understand how and when to take each.   albuterol 108 (90 Base) MCG/ACT inhaler Commonly known as: VENTOLIN HFA Inhale 1-2 puffs into the lungs every 4 (four) hours as needed for wheezing or shortness of breath. What changed: how much to take   ALPRAZolam 0.5 MG tablet Commonly known as: XANAX Take 1 tablet (0.5 mg total) by mouth 2 (two) times daily as needed for anxiety.   amLODipine 5 MG tablet Commonly known as: NORVASC Take 1 tablet (5  mg total) by mouth daily.   buPROPion 150 MG 24 hr tablet Commonly known as: WELLBUTRIN XL Take 1 tablet (150 mg total) by mouth daily.   clobetasol cream 0.05 % Commonly known as: TEMOVATE Apply 1 application topically 2 (two) times daily. To arms What changed:   when to take this  reasons to take this   dabigatran 150 MG Caps capsule Commonly known as: Pradaxa Take 1 capsule (150 mg total) by mouth 2 (two) times daily.   Dexilant 60 MG capsule Generic drug: dexlansoprazole Take 60 mg by mouth daily before breakfast.   Dexlansoprazole 30 MG capsule Commonly  known as: Dexilant Take 1 capsule (30 mg total) by mouth daily. Note lower maintenance dose take 30 min before food lunch or dinner not with thyroid medication   DULoxetine 60 MG capsule Commonly known as: CYMBALTA Take 1 capsule (60 mg total) by mouth daily.   estradiol 0.1 MG/GM vaginal cream Commonly known as: ESTRACE Apply 0.5mg  (pea-sized amount)  just inside the vaginal introitus with a finger-tip on Monday, Wednesday and Friday nights, What changed:   how much to take  how to take this  when to take this   fesoterodine 8 MG Tb24 tablet Commonly known as: TOVIAZ Take 1 tablet (8 mg total) by mouth daily.   fluticasone-salmeterol 115-21 MCG/ACT inhaler Commonly known as: Advair HFA Inhale 2 puffs into the lungs daily. Rinse mouth thoroughly after use   fosfomycin 3 g Pack Commonly known as: MONUROL Take 3 g by mouth once for 1 dose. Started by: Debroah Loop, PA-C   hydrochlorothiazide 12.5 MG tablet Commonly known as: HYDRODIURIL Take 1 tablet (12.5 mg total) by mouth daily. In am   HYDROcodone-acetaminophen 5-325 MG tablet Commonly known as: NORCO/VICODIN Take 1 tablet by mouth every 8 (eight) hours as needed for severe pain. Must last 30 days Start taking on: February 03, 2019   levocetirizine 5 MG tablet Commonly known as: XYZAL Take 1 tablet (5 mg total) by mouth every evening.    levothyroxine 175 MCG tablet Commonly known as: SYNTHROID Take 1 tablet (175 mcg total) by mouth daily before breakfast.   losartan 50 MG tablet Commonly known as: COZAAR Take 1 tablet (50 mg total) by mouth daily. Do not take quinapril 40   meloxicam 15 MG tablet Commonly known as: MOBIC Take 1 tablet (15 mg total) by mouth daily as needed for pain.   metFORMIN 1000 MG tablet Commonly known as: GLUCOPHAGE Take 1 tablet (1,000 mg total) by mouth 2 (two) times daily. With food   metoprolol succinate 50 MG 24 hr tablet Commonly known as: TOPROL-XL Take 1 tablet (50 mg total) by mouth daily. PLEASE CALL [phone] TO SCHEDULE APPOINTMENT FOR FURTHER REFILLS.   montelukast 10 MG tablet Commonly known as: SINGULAIR Take 1 tablet (10 mg total) by mouth daily. What changed: when to take this   mupirocin ointment 2 % Commonly known as: Bactroban Apply 1 application topically 2 (two) times daily. Skin wounds What changed:   when to take this  reasons to take this  additional instructions   nystatin cream Commonly known as: MYCOSTATIN Apply 1 application topically 2 (two) times daily. Mouth prn for 7 days as needed What changed:   when to take this  reasons to take this   ondansetron 4 MG tablet Commonly known as: Zofran Take 1 tablet (4 mg total) by mouth every 8 (eight) hours as needed for nausea or vomiting.   oxybutynin 15 MG 24 hr tablet Commonly known as: DITROPAN XL TAKE ONE TABLET BY MOUTH AT BEDTIME   phenazopyridine 200 MG tablet Commonly known as: Pyridium Take 1 tablet (200 mg total) by mouth 3 (three) times daily as needed for pain.   pregabalin 50 MG capsule Commonly known as: Lyrica Take 1 capsule (50 mg total) by mouth 3 (three) times daily.   sucralfate 1 GM/10ML suspension Commonly known as: CARAFATE Take 1 g by mouth 2 (two) times daily before a meal.   zinc oxide 20 % ointment Apply 1 application topically as needed for irritation.        Allergies:  Allergies  Allergen Reactions  . Morphine And Related Other (See Comments) and Nausea Only    Patient becomes very confused Other reaction(s): Other (See Comments), Other (See Comments) Patient becomes very confused Patient becomes very confused   . Penicillins Hives, Shortness Of Breath, Swelling and Other (See Comments)    Facial swelling Has patient had a PCN reaction causing immediate rash, facial/tongue/throat swelling, SOB or lightheadedness with hypotension: Yes Has patient had a PCN reaction causing severe rash involving mucus membranes or skin necrosis: Yes Has patient had a PCN reaction that required hospitalization No Has patient had a PCN reaction occurring within the last 10 years: No If all of the above answers are "NO", then may proceed with Cephalosporin use.  Other reaction(s): Other (See Comments), Other (See Comments) Facial swelling Has patient had a PCN reaction causing immediate rash, facial/tongue/throat swelling, SOB or lightheadedness with hypotension: Yes Has patient had a PCN reaction causing severe rash involving mucus membranes or skin necrosis: Yes Has patient had a PCN reaction that required hospitalization No Has patient had a PCN reaction occurring within the last 10 years: No If all of the above answers are "NO", then may proceed with Cephalosporin use. Facial swelling Has patient had a PCN reaction causing immediate rash, facial/tongue/throat swelling, SOB or lightheadedness with hypotension: Yes Has patient had a PCN reaction causing severe rash involving mucus membranes or skin necrosis: Yes Has patient had a PCN reaction that required hospitalization No Has patient had a PCN reaction occurring within the last 10 years: No If all of the above answers are "NO", then may proceed with Cephalosporin use. Facial swelling Has patient had a PCN reaction causing immediate rash, facial/tongue/throat swelling, SOB or lightheadedness with  hypotension: Yes Has patient had a PCN reaction causing severe rash involving mucus membranes or skin necrosis: Yes Has patient had a PCN reaction that required hospitalization No Has patient had a PCN reaction occurring within the last 10 years: No If all of the above answers are "NO", then may proceed with Cephalosporin use.   . Aspirin Hives  . Oxytetracycline Hives    Other reaction(s): Unknown     Family History: Family History  Problem Relation Age of Onset  . Skin cancer Father   . Diabetes Father   . Hypertension Father   . Peripheral vascular disease Father   . Cancer Father   . Cerebral aneurysm Father   . Alcohol abuse Father   . Varicose Veins Mother   . Kidney disease Mother   . Arthritis Mother   . Mental illness Sister   . Cancer Maternal Aunt        breast  . Breast cancer Maternal Aunt   . Arthritis Maternal Grandmother   . Hypertension Maternal Grandmother   . Diabetes Maternal Grandmother   . Arthritis Maternal Grandfather   . Heart disease Maternal Grandfather   . Hypertension Maternal Grandfather   . Arthritis Paternal Grandmother   . Hypertension Paternal Grandmother   . Diabetes Paternal Grandmother   . Arthritis Paternal Grandfather   . Hypertension Paternal Grandfather   . Bladder Cancer Neg Hx   . Kidney cancer Neg Hx     Social History:   reports that she has never smoked. She has never used smokeless tobacco. She reports that she does not drink alcohol or use drugs.  ROS: UROLOGY Frequent Urination?: Yes Hard to postpone urination?: Yes Burning/pain with urination?: Yes Get up at night to urinate?: Yes Leakage of urine?:  Yes Urine stream starts and stops?: No Trouble starting stream?: No Do you have to strain to urinate?: No Blood in urine?: No Urinary tract infection?: Yes Sexually transmitted disease?: No Injury to kidneys or bladder?: No Painful intercourse?: No Weak stream?: No Currently pregnant?: No Vaginal bleeding?:  No Last menstrual period?: n  Gastrointestinal Nausea?: No Vomiting?: No Indigestion/heartburn?: Yes Diarrhea?: No Constipation?: No  Constitutional Fever: No Night sweats?: No Weight loss?: No Fatigue?: Yes  Skin Skin rash/lesions?: No Itching?: No  Eyes Blurred vision?: No Double vision?: No  Ears/Nose/Throat Sore throat?: No Sinus problems?: Yes  Hematologic/Lymphatic Swollen glands?: No Easy bruising?: Yes  Cardiovascular Leg swelling?: Yes Chest pain?: No  Respiratory Cough?: No Shortness of breath?: No  Endocrine Excessive thirst?: No  Musculoskeletal Back pain?: No Joint pain?: Yes  Neurological Headaches?: No Dizziness?: No  Psychologic Depression?: No Anxiety?: No  Physical Exam: BP (!) 148/78   Pulse 86   Ht 5\' 5"  (1.651 m)   Wt 252 lb (114.3 kg)   BMI 41.93 kg/m   Constitutional:  Alert and oriented, no acute distress, nontoxic appearing HEENT: [place], AT Cardiovascular: No clubbing, cyanosis, or edema Respiratory: Normal respiratory effort, no increased work of breathing GI: Abdomen is soft, nontender, nondistended, no abdominal masses GU: No CVA tenderness Lymph: No cervical or inguinal lymphadenopathy Skin: No rashes, bruises or suspicious lesions Neurologic: Grossly intact, no focal deficits, moving all 4 extremities Psychiatric: Normal mood and affect  Laboratory Data: Results for orders placed or performed in visit on 01/13/19  Microscopic Examination   URINE  Result Value Ref Range   WBC, UA >30 (A) 0 - 5 /hpf   RBC 3-10 (A) 0 - 2 /hpf   Epithelial Cells (non renal) 0-10 0 - 10 /hpf   Crystals Present (A) N/A   Crystal Type Amorphous Sediment N/A   Bacteria, UA Many (A) None seen/Few  Urinalysis, Complete  Result Value Ref Range   Specific Gravity, UA >1.030 (H) 1.005 - 1.030   pH, UA 5.0 5.0 - 7.5   Color, UA Yellow Yellow   Appearance Ur Cloudy (A) Clear   Leukocytes,UA 1+ (A) Negative   Protein,UA 2+ (A)  Negative/Trace   Glucose, UA Trace (A) Negative   Ketones, UA 1+ (A) Negative   RBC, UA 2+ (A) Negative   Bilirubin, UA Negative Negative   Urobilinogen, Ur 0.2 0.2 - 1.0 mg/dL   Nitrite, UA Negative Negative   Microscopic Examination See below:   BLADDER SCAN AMB NON-IMAGING  Result Value Ref Range   Scan Result 0 mL   Assessment & Plan:   1. Dysuria Patient reporting irritative voiding symptoms of dysuria and nocturia.  UA concerning for acute cystitis with hematuria. Patient was unable to provide sufficient urine volume for culture today.  Will treat with fosfomycin 3 g x 1 dose based on prior urine cultures. - Urinalysis, Complete - BLADDER SCAN AMB NON-IMAGING  2. Recurrent UTI Counseled patient that it is very important for her to follow-up with infectious diseases.  Phone number provided for the clinic.  I counseled her to contact their office later today or Monday to schedule an appointment.  My referral remains active.  Also advised patient to start daily probiotic therapy, particularly given the use of numerous antibiotics over the past several months for recurrent UTI. - fosfomycin (MONUROL) 3 g PACK; Take 3 g by mouth once for 1 dose.  Dispense: 3 g; Refill: [facility] [address] [place], [place] [address] [phone]

## 2019-01-13 NOTE — Patient Instructions (Signed)
Start a daily probiotic to help with UTI prevention. Any over the counter brand will work.

## 2019-01-16 ENCOUNTER — Telehealth: Payer: Self-pay | Admitting: Physician Assistant

## 2019-01-16 DIAGNOSIS — M25571 Pain in right ankle and joints of right foot: Secondary | ICD-10-CM | POA: Diagnosis not present

## 2019-01-16 NOTE — Telephone Encounter (Signed)
Pt needs PA for her Myrbetriq

## 2019-01-16 NOTE — Telephone Encounter (Signed)
Prior auth for [facility] 3GM packets prior authorization submitted via covermymeds.  Waiting response.

## 2019-01-16 NOTE — Progress Notes (Deleted)
Patient ID: [name], female    DOB: November 12, 1950, 68 y.o.   MRN: [id number]   No chief complaint on file.  [name] is a 68 yo female who presents for follow up after undergoin excision of left foot wound with Acell placement on 11/09/18.  Review of Systems  Past Medical History:  Diagnosis Date   Abnormal antibody titer    Anxiety and depression    Arthritis    Asthma    Cystocele    Depression    Diabetes ([place])    DVT (deep venous thrombosis) (HCC)    Righ calf   Dyspnea    11/08/2018 - "more now due to lack of exercise"   Dysrhythmia    afib   Edema    GERD (gastroesophageal reflux disease)    Heart murmur    Echocardiogram June 2015: Mild MR (possible vegetation seen on TTE, not seen on TEE), normal LV size with moderate concentric LVH. Normal function EF 60-65%. Normal diastolic function. Mild LA dilation.   History of IBS    History of kidney stones    History of methicillin resistant staphylococcus aureus (MRSA) 2008   Hyperlipidemia    Hypertension    Hypothyroidism    Insomnia    Kidney stone    kidney stones with lithotripsy .  Hydro Kidney- "adrenal glands"   Neuropathy involving both lower extremities    Obesity, Class II, BMI 35-39.9, with comorbidity    Paroxysmal atrial fibrillation ([place]) 2007   In June 2015, Cardiac Event Monitor: Mostly SR/sinus arrhythmia with PVCs that are frequent. Short bursts of A. fib lasting several minutes;; CHA2DS2-VASc Score = 5 (age, Female, PVD, DM, HTN)   Peripheral vascular disease ([place])    Sleep apnea    use C-PAP   Urinary incontinence    Venous stasis dermatitis of both lower extremities     Past Surgical History:  Procedure Laterality Date   ANKLE SURGERY Right    APPLICATION OF WOUND VAC Left 06/27/2015   Procedure: APPLICATION OF WOUND VAC ( POSSIBLE ) ;  Surgeon: [name], MD;  Location: [facility];  Service: Vascular;  Laterality: Left;   APPLICATION OF WOUND VAC  Left 09/25/2016   Procedure: APPLICATION OF WOUND VAC;  Surgeon: [name], DPM;  Location: [facility];  Service: Podiatry;  Laterality: Left;   arm surgery     right     CHOLECYSTECTOMY     COLONOSCOPY     COLONOSCOPY WITH PROPOFOL N/A 01/11/2017   Procedure: COLONOSCOPY WITH PROPOFOL;  Surgeon: [name], MD;  Location: [facility] ENDOSCOPY;  Service: Endoscopy;  Laterality: N/A;   COLONOSCOPY WITH PROPOFOL N/A 02/22/2017   Procedure: COLONOSCOPY WITH PROPOFOL;  Surgeon: [name], MD;  Location: [facility] ENDOSCOPY;  Service: Endoscopy;  Laterality: N/A;   COLONOSCOPY WITH PROPOFOL N/A 05/31/2017   Procedure: COLONOSCOPY WITH PROPOFOL;  Surgeon: [name], MD;  Location: [facility] ENDOSCOPY;  Service: Endoscopy;  Laterality: N/A;   ESOPHAGOGASTRODUODENOSCOPY (EGD) WITH PROPOFOL N/A 01/11/2017   Procedure: ESOPHAGOGASTRODUODENOSCOPY (EGD) WITH PROPOFOL;  Surgeon: [name], MD;  Location: [facility] ENDOSCOPY;  Service: Endoscopy;  Laterality: N/A;   ESOPHAGOGASTRODUODENOSCOPY (EGD) WITH PROPOFOL N/A 10/25/2018   Procedure: ESOPHAGOGASTRODUODENOSCOPY (EGD) WITH PROPOFOL;  Surgeon: [name], MD;  Location: [facility] ENDOSCOPY;  Service: Endoscopy;  Laterality: N/A;   HERNIA REPAIR     umbilical   HIATAL HERNIA REPAIR     I&D EXTREMITY Left 06/27/2015  Procedure: IRRIGATION AND DEBRIDEMENT EXTREMITY            ( CALF HEMATOMA ) POSSIBLE WOUND VAC;  Surgeon: [name], MD;  Location: [facility];  Service: Vascular;  Laterality: Left;   INCISION AND DRAINAGE OF WOUND Left 09/25/2016   Procedure: IRRIGATION AND DEBRIDEMENT - PARTIAL RESECTION OF ACHILLES TENDON WITH WOUND VAC APPLICATION;  Surgeon: [name], DPM;  Location: [facility];  Service: Podiatry;  Laterality: Left;   INCISION AND DRAINAGE OF WOUND Left 11/09/2018   Procedure: Excision of left foot wound with ACell placement;  Surgeon: Wallace Going, DO;  Location: [place];  Service: Plastics;   Laterality: Left;   IRRIGATION AND DEBRIDEMENT ABSCESS Left 09/07/2016   Procedure: IRRIGATION AND DEBRIDEMENT ABSCESS LEFT HEEL;  Surgeon: [name], DPM;  Location: [facility];  Service: Podiatry;  Laterality: Left;   JOINT REPLACEMENT  2013   left knee replacement   KIDNEY SURGERY Right    kidney stones   LITHOTRIPSY     LOWER EXTREMITY ANGIOGRAPHY Left 04/04/2018   Procedure: LOWER EXTREMITY ANGIOGRAPHY;  Surgeon: [name], MD;  Location: [facility];  Service: Cardiovascular;  Laterality: Left;   NM GATED MYOVIEW [facility] HX)  February 2017   Likely breast attenuation. LOW RISK study. Normal EF 55-60%.   OTHER SURGICAL HISTORY     08/2016 or 09/2016 surgery on achilles tenso h/o staph infection heal. Dr. [name]   removal of left hematoma Left    leg   REVERSE SHOULDER ARTHROPLASTY Right 10/08/2015   Procedure: REVERSE SHOULDER ARTHROPLASTY;  Surgeon: [name], MD;  Location: [facility];  Service: Orthopedics;  Laterality: Right;   SLEEVE GASTROPLASTY     TONSILLECTOMY     TOTAL KNEE ARTHROPLASTY Left    TOTAL SHOULDER REPLACEMENT Right    TRANSESOPHAGEAL ECHOCARDIOGRAM  08/08/2013   Mild LVH, EF 60-65%. Moderate LA dilation and mild RA dilation. Mild MR with no evidence of stenosis and no evidence of endocarditis. A false chordae is noted.   TRANSTHORACIC ECHOCARDIOGRAM  08/03/2013   Mild-Moderate concentric LVH, EF 60-65%. Normal diastolic function. Mild LA dilation. Mild MR with possible vegetation  - not confirmed on TEE    ULNAR NERVE TRANSPOSITION Right 08/06/2016   Procedure: ULNAR NERVE DECOMPRESSION/TRANSPOSITION;  Surgeon: [name], MD;  Location: [facility];  Service: Orthopedics;  Laterality: Right;   UPPER GI ENDOSCOPY     VAGINAL HYSTERECTOMY        Current Outpatient Medications:    albuterol (PROVENTIL HFA;VENTOLIN HFA) 108 (90 Base) MCG/ACT inhaler, Inhale 1-2 puffs into the lungs every 4 (four) hours as needed for wheezing or  shortness of breath. (Patient taking differently: Inhale 2 puffs into the lungs every 4 (four) hours as needed for wheezing or shortness of breath. ), Disp: 8 g, Rfl: 2   albuterol (PROVENTIL) (2.5 MG/3ML) 0.083% nebulizer solution, Take 3 mLs (2.5 mg total) by nebulization every 6 (six) hours as needed for wheezing or shortness of breath., Disp: 150 mL, Rfl: 1   ALPRAZolam (XANAX) 0.5 MG tablet, Take 1 tablet (0.5 mg total) by mouth 2 (two) times daily as needed for anxiety., Disp: 60 tablet, Rfl: 2   amLODipine (NORVASC) 5 MG tablet, Take 1 tablet (5 mg total) by mouth daily., Disp: 90 tablet, Rfl: 3   buPROPion (WELLBUTRIN XL) 150 MG 24 hr tablet, Take 1 tablet (150 mg total) by mouth daily., Disp: 90 tablet, Rfl: 3   clobetasol cream (TEMOVATE)  [id number] %, Apply 1 application topically 2 (two) times daily. To arms (Patient taking differently: Apply 1 application topically 2 (two) times daily as needed (irritation). To arms), Disp: 60 g, Rfl: 1   dabigatran (PRADAXA) 150 MG CAPS capsule, Take 1 capsule (150 mg total) by mouth 2 (two) times daily., Disp: 180 capsule, Rfl: 1   Dexlansoprazole (DEXILANT) 30 MG capsule, Take 1 capsule (30 mg total) by mouth daily. Note lower maintenance dose take 30 min before food lunch or dinner not with thyroid medication, Disp: 90 capsule, Rfl: 3   dexlansoprazole (DEXILANT) 60 MG capsule, Take 60 mg by mouth daily before breakfast., Disp: , Rfl:    DULoxetine (CYMBALTA) 60 MG capsule, Take 1 capsule (60 mg total) by mouth daily., Disp: 90 capsule, Rfl: 0   estradiol (ESTRACE) 0.1 MG/GM vaginal cream, Apply 0.5mg  (pea-sized amount)  just inside the vaginal introitus with a finger-tip on Monday, Wednesday and Friday nights, (Patient taking differently: Place 1 Applicatorful vaginally every Monday, Wednesday, and Friday. Apply 0.5mg  (pea-sized amount)  just inside the vaginal introitus with a finger-tip on Monday, Wednesday and Friday nights,), Disp: 42.5 g, Rfl:  11   fesoterodine (TOVIAZ) 8 MG TB24 tablet, Take 1 tablet (8 mg total) by mouth daily., Disp: 90 tablet, Rfl: 3   fluticasone-salmeterol (ADVAIR HFA) 115-21 MCG/ACT inhaler, Inhale 2 puffs into the lungs daily. Rinse mouth thoroughly after use, Disp: 1 Inhaler, Rfl: 12   hydrochlorothiazide (HYDRODIURIL) 12.5 MG tablet, Take 1 tablet (12.5 mg total) by mouth daily. In am, Disp: 90 tablet, Rfl: 3   [START ON 02/03/2019] HYDROcodone-acetaminophen (NORCO/VICODIN) 5-325 MG tablet, Take 1 tablet by mouth every 8 (eight) hours as needed for severe pain. Must last 30 days, Disp: 90 tablet, Rfl: 0   levocetirizine (XYZAL) 5 MG tablet, Take 1 tablet (5 mg total) by mouth every evening., Disp: 90 tablet, Rfl: 3   levothyroxine (SYNTHROID, LEVOTHROID) 175 MCG tablet, Take 1 tablet (175 mcg total) by mouth daily before breakfast., Disp: 90 tablet, Rfl: 3   losartan (COZAAR) 50 MG tablet, Take 1 tablet (50 mg total) by mouth daily. Do not take quinapril 40, Disp: 90 tablet, Rfl: 3   meloxicam (MOBIC) 15 MG tablet, Take 1 tablet (15 mg total) by mouth daily as needed for pain. (Patient not taking: Reported on 12/30/2018), Disp: 90 tablet, Rfl: 0   metFORMIN (GLUCOPHAGE) 1000 MG tablet, Take 1 tablet (1,000 mg total) by mouth 2 (two) times daily. With food, Disp: 180 tablet, Rfl: 3   metoprolol succinate (TOPROL-XL) 50 MG 24 hr tablet, Take 1 tablet (50 mg total) by mouth daily. PLEASE CALL [phone] TO SCHEDULE APPOINTMENT FOR FURTHER REFILLS., Disp: 30 tablet, Rfl: 0   montelukast (SINGULAIR) 10 MG tablet, Take 1 tablet (10 mg total) by mouth daily. (Patient taking differently: Take 10 mg by mouth at bedtime. ), Disp: 90 tablet, Rfl: 3   mupirocin ointment (BACTROBAN) 2 %, Apply 1 application topically 2 (two) times daily. Skin wounds (Patient taking differently: Apply 1 application topically 2 (two) times daily as needed (skin wounds). ), Disp: 30 g, Rfl: 0   nystatin cream (MYCOSTATIN), Apply 1  application topically 2 (two) times daily. Mouth prn for 7 days as needed (Patient taking differently: Apply 1 application topically 2 (two) times daily as needed for dry skin. Mouth prn for 7 days as needed), Disp: 30 g, Rfl: 11   ondansetron (ZOFRAN) 4 MG tablet, Take 1 tablet (4 mg total) by mouth every 8 (  eight) hours as needed for nausea or vomiting., Disp: 30 tablet, Rfl: 0   oxybutynin (DITROPAN XL) 15 MG 24 hr tablet, TAKE ONE TABLET BY MOUTH AT BEDTIME (Patient taking differently: Take 15 mg by mouth at bedtime. ), Disp: 90 tablet, Rfl: 3   phenazopyridine (PYRIDIUM) 200 MG tablet, Take 1 tablet (200 mg total) by mouth 3 (three) times daily as needed for pain., Disp: 20 tablet, Rfl: 0   pregabalin (LYRICA) 50 MG capsule, Take 1 capsule (50 mg total) by mouth 3 (three) times daily., Disp: 90 capsule, Rfl: 2   sucralfate (CARAFATE) 1 GM/10ML suspension, Take 1 g by mouth 2 (two) times daily before a meal., Disp: , Rfl:    zinc oxide 20 % ointment, Apply 1 application topically as needed for irritation., Disp: 56.7 g, Rfl: 0  Current Facility-Administered Medications:    ipratropium-albuterol (DUONEB) 0.5-2.5 (3) MG/3ML nebulizer solution 3 mL, 3 mL, Nebulization, Q6H, [name], [name], MD   Objective:   There were no vitals filed for this visit.  Physical Exam  General: alert, calm, no acute distress HEENT:{Exam; heent:10322} Neck: {Neck Exam:5327} Chest: symmetrical rise and fall Lungs: unlabored breathing Breast: {Exam; breast:13139::"normal appearance, no masses or tenderness"} Cardiac: +2 bilateral radial pulse, cap refill <3 sec Abdomen: {Exam; abdomen:16834} GU: {exam; gu:16315} Musculoskeletal: MAEx4 Neuro: A&O x3, calm, cooperative, steady gait Extremities: {Exam; extremities:15096} Skin: {Exam; skin:14952}   Assessment & Plan:  No diagnosis found.  ***  Picture placed in chart with patient's consent.  Alfredo Batty, NP

## 2019-01-17 ENCOUNTER — Telehealth: Payer: Self-pay | Admitting: Infectious Diseases

## 2019-01-17 ENCOUNTER — Ambulatory Visit: Payer: PPO | Admitting: Nurse Practitioner

## 2019-01-17 ENCOUNTER — Telehealth: Payer: Self-pay | Admitting: Internal Medicine

## 2019-01-17 NOTE — Telephone Encounter (Signed)
Scheduled appt January 26, 2019 9 am

## 2019-01-17 NOTE — Telephone Encounter (Signed)
Dr. Deeann Dowse pt needs appt Right ankle fracture age indeterminate with right ankle pain had Xray 01/16/19 and given Toradol injection [facility] nurse which helped   [place]

## 2019-01-17 NOTE — Telephone Encounter (Signed)
Left message informing patient medication was approved and she should contact the pharmacy.  Also wanted to follow up if patient made appointment with infectious disease.

## 2019-01-17 NOTE — Telephone Encounter (Signed)
Patient called and left message to make an appt with [place]  Tried to call back to get one scheduled but number on chart is wrong person.

## 2019-01-23 DIAGNOSIS — G4733 Obstructive sleep apnea (adult) (pediatric): Secondary | ICD-10-CM | POA: Diagnosis not present

## 2019-01-24 ENCOUNTER — Encounter: Payer: Self-pay | Admitting: Nurse Practitioner

## 2019-01-24 ENCOUNTER — Ambulatory Visit (INDEPENDENT_AMBULATORY_CARE_PROVIDER_SITE_OTHER): Payer: PPO | Admitting: Nurse Practitioner

## 2019-01-24 ENCOUNTER — Other Ambulatory Visit: Payer: Self-pay

## 2019-01-24 VITALS — BP 133/78 | HR 74 | Temp 97.3°F | Ht 65.0 in

## 2019-01-24 DIAGNOSIS — M79672 Pain in left foot: Secondary | ICD-10-CM | POA: Diagnosis not present

## 2019-01-24 DIAGNOSIS — M25571 Pain in right ankle and joints of right foot: Secondary | ICD-10-CM | POA: Diagnosis not present

## 2019-01-24 DIAGNOSIS — L97522 Non-pressure chronic ulcer of other part of left foot with fat layer exposed: Secondary | ICD-10-CM

## 2019-01-24 DIAGNOSIS — L97521 Non-pressure chronic ulcer of other part of left foot limited to breakdown of skin: Secondary | ICD-10-CM | POA: Diagnosis not present

## 2019-01-24 DIAGNOSIS — E1143 Type 2 diabetes mellitus with diabetic autonomic (poly)neuropathy: Secondary | ICD-10-CM | POA: Diagnosis not present

## 2019-01-24 DIAGNOSIS — E1159 Type 2 diabetes mellitus with other circulatory complications: Secondary | ICD-10-CM | POA: Diagnosis not present

## 2019-01-24 NOTE — Progress Notes (Signed)
Patient ID: [name], female    DOB: 12/24/1950, 68 y.o.   MRN: [id number]   C.C.: left foot wound  Akeyla Muchnik is a 68 yo female who presents for follow up after undergoing excision of left foot wound with Acell placement on 11/09/18. Patient believes the foot wound is healing. The wound was 1.4 cm x 1.4 cm in size at her last visit. Patient denies pain at the wound site as long as the area is padded well. Patient states she fractured her right foot 4 weeks ago. Patient states she had the foot x-rayed at home. Patient states she has not sought treatment for the fracture because she has been unable to bear weight and was afraid of falling down her stairs. Patient also states her left foot became red and swollen about 2 weeks ago. Patient states her podiatrist prescribed a course of doxycycline for possible infection and another medication for possible gout. Patient unable to recall the medication name. Patient states she only took 4 days of doxycycline due to diarrhea. Patient states she is going to an appointment with her podiatrist today to discuss both feet.   Review of Systems  Constitutional: Positive for activity change.  HENT: Negative.   Respiratory: Negative.   Cardiovascular: Negative.   Gastrointestinal: Negative.   Genitourinary: Negative.   Musculoskeletal:       Right foot pain   Skin: Positive for wound.  Neurological: Negative.     Past Medical History:  Diagnosis Date   Abnormal antibody titer    Anxiety and depression    Arthritis    Asthma    Cystocele    Depression    Diabetes ([place])    DVT (deep venous thrombosis) (HCC)    Righ calf   Dyspnea    11/08/2018 - "more now due to lack of exercise"   Dysrhythmia    afib   Edema    GERD (gastroesophageal reflux disease)    Heart murmur    Echocardiogram June 2015: Mild MR (possible vegetation seen on TTE, not seen on TEE), normal LV size with moderate concentric LVH. Normal function EF  60-65%. Normal diastolic function. Mild LA dilation.   History of IBS    History of kidney stones    History of methicillin resistant staphylococcus aureus (MRSA) 2008   Hyperlipidemia    Hypertension    Hypothyroidism    Insomnia    Kidney stone    kidney stones with lithotripsy .  [facility]- "adrenal glands"   Neuropathy involving both lower extremities    Obesity, Class II, BMI 35-39.9, with comorbidity    Paroxysmal atrial fibrillation ([place]) 2007   In June 2015, Cardiac Event Monitor: Mostly SR/sinus arrhythmia with PVCs that are frequent. Short bursts of A. fib lasting several minutes;; CHA2DS2-VASc Score = 5 (age, Female, PVD, DM, HTN)   Peripheral vascular disease ([place])    Sleep apnea    use C-PAP   Urinary incontinence    Venous stasis dermatitis of both lower extremities     Past Surgical History:  Procedure Laterality Date   ANKLE SURGERY Right    APPLICATION OF WOUND VAC Left 06/27/2015   Procedure: APPLICATION OF WOUND VAC ( POSSIBLE ) ;  Surgeon: [name], MD;  Location: [facility] ORS;  Service: Vascular;  Laterality: Left;   APPLICATION OF WOUND VAC Left 09/25/2016   Procedure: APPLICATION OF WOUND VAC;  Surgeon: [name], DPM;  Location: [facility];  Service: Podiatry;  Laterality: Left;   arm surgery     right     CHOLECYSTECTOMY     COLONOSCOPY     COLONOSCOPY WITH PROPOFOL N/A 01/11/2017   Procedure: COLONOSCOPY WITH PROPOFOL;  Surgeon: [name], MD;  Location: [facility] ENDOSCOPY;  Service: Endoscopy;  Laterality: N/A;   COLONOSCOPY WITH PROPOFOL N/A 02/22/2017   Procedure: COLONOSCOPY WITH PROPOFOL;  Surgeon: [name], MD;  Location: [facility] ENDOSCOPY;  Service: Endoscopy;  Laterality: N/A;   COLONOSCOPY WITH PROPOFOL N/A 05/31/2017   Procedure: COLONOSCOPY WITH PROPOFOL;  Surgeon: [name], MD;  Location: [facility] ENDOSCOPY;  Service: Endoscopy;  Laterality: N/A;   ESOPHAGOGASTRODUODENOSCOPY (EGD) WITH PROPOFOL  N/A 01/11/2017   Procedure: ESOPHAGOGASTRODUODENOSCOPY (EGD) WITH PROPOFOL;  Surgeon: [name], MD;  Location: [facility] ENDOSCOPY;  Service: Endoscopy;  Laterality: N/A;   ESOPHAGOGASTRODUODENOSCOPY (EGD) WITH PROPOFOL N/A 10/25/2018   Procedure: ESOPHAGOGASTRODUODENOSCOPY (EGD) WITH PROPOFOL;  Surgeon: [name], MD;  Location: [facility] ENDOSCOPY;  Service: Endoscopy;  Laterality: N/A;   HERNIA REPAIR     umbilical   HIATAL HERNIA REPAIR     I&D EXTREMITY Left 06/27/2015   Procedure: IRRIGATION AND DEBRIDEMENT EXTREMITY            ( CALF HEMATOMA ) POSSIBLE WOUND VAC;  Surgeon: [name], MD;  Location: [facility];  Service: Vascular;  Laterality: Left;   INCISION AND DRAINAGE OF WOUND Left 09/25/2016   Procedure: IRRIGATION AND DEBRIDEMENT - PARTIAL RESECTION OF ACHILLES TENDON WITH WOUND VAC APPLICATION;  Surgeon: [name], DPM;  Location: [facility];  Service: Podiatry;  Laterality: Left;   INCISION AND DRAINAGE OF WOUND Left 11/09/2018   Procedure: Excision of left foot wound with ACell placement;  Surgeon: Wallace Going, DO;  Location: [facility];  Service: Plastics;  Laterality: Left;   IRRIGATION AND DEBRIDEMENT ABSCESS Left 09/07/2016   Procedure: IRRIGATION AND DEBRIDEMENT ABSCESS LEFT HEEL;  Surgeon: [name], DPM;  Location: [facility];  Service: Podiatry;  Laterality: Left;   JOINT REPLACEMENT  2013   left knee replacement   KIDNEY SURGERY Right    kidney stones   LITHOTRIPSY     LOWER EXTREMITY ANGIOGRAPHY Left 04/04/2018   Procedure: LOWER EXTREMITY ANGIOGRAPHY;  Surgeon: [name], MD;  Location: [place] [facility];  Service: Cardiovascular;  Laterality: Left;   NM GATED MYOVIEW [facility] HX)  February 2017   Likely breast attenuation. LOW RISK study. Normal EF 55-60%.   OTHER SURGICAL HISTORY     08/2016 or 09/2016 surgery on achilles tenso h/o staph infection heal. Dr. [name]   removal of left hematoma Left    leg   REVERSE SHOULDER  ARTHROPLASTY Right 10/08/2015   Procedure: REVERSE SHOULDER ARTHROPLASTY;  Surgeon: [name], MD;  Location: [facility];  Service: Orthopedics;  Laterality: Right;   SLEEVE GASTROPLASTY     TONSILLECTOMY     TOTAL KNEE ARTHROPLASTY Left    TOTAL SHOULDER REPLACEMENT Right    TRANSESOPHAGEAL ECHOCARDIOGRAM  08/08/2013   Mild LVH, EF 60-65%. Moderate LA dilation and mild RA dilation. Mild MR with no evidence of stenosis and no evidence of endocarditis. A false chordae is noted.   TRANSTHORACIC ECHOCARDIOGRAM  08/03/2013   Mild-Moderate concentric LVH, EF 60-65%. Normal diastolic function. Mild LA dilation. Mild MR with possible vegetation  - not confirmed on TEE    ULNAR NERVE TRANSPOSITION Right 08/06/2016   Procedure: ULNAR NERVE DECOMPRESSION/TRANSPOSITION;  Surgeon: [name], MD;  Location: [facility];  Service: Orthopedics;  Laterality: Right;   UPPER GI ENDOSCOPY     VAGINAL HYSTERECTOMY        Current Outpatient Medications:    albuterol (PROVENTIL HFA;VENTOLIN HFA) 108 (90 Base) MCG/ACT inhaler, Inhale 1-2 puffs into the lungs every 4 (four) hours as needed for wheezing or shortness of breath. (Patient taking differently: Inhale 2 puffs into the lungs every 4 (four) hours as needed for wheezing or shortness of breath. ), Disp: 8 g, Rfl: 2   albuterol (PROVENTIL) (2.5 MG/3ML) 0.083% nebulizer solution, Take 3 mLs (2.5 mg total) by nebulization every 6 (six) hours as needed for wheezing or shortness of breath., Disp: 150 mL, Rfl: 1   ALPRAZolam (XANAX) 0.5 MG tablet, Take 1 tablet (0.5 mg total) by mouth 2 (two) times daily as needed for anxiety., Disp: 60 tablet, Rfl: 2   amLODipine (NORVASC) 5 MG tablet, Take 1 tablet (5 mg total) by mouth daily., Disp: 90 tablet, Rfl: 3   buPROPion (WELLBUTRIN XL) 150 MG 24 hr tablet, Take 1 tablet (150 mg total) by mouth daily., Disp: 90 tablet, Rfl: 3   clobetasol cream (TEMOVATE) [id number] %, Apply 1 application topically 2 (two) times  daily. To arms (Patient taking differently: Apply 1 application topically 2 (two) times daily as needed (irritation). To arms), Disp: 60 g, Rfl: 1   dabigatran (PRADAXA) 150 MG CAPS capsule, Take 1 capsule (150 mg total) by mouth 2 (two) times daily., Disp: 180 capsule, Rfl: 1   Dexlansoprazole (DEXILANT) 30 MG capsule, Take 1 capsule (30 mg total) by mouth daily. Note lower maintenance dose take 30 min before food lunch or dinner not with thyroid medication, Disp: 90 capsule, Rfl: 3   dexlansoprazole (DEXILANT) 60 MG capsule, Take 60 mg by mouth daily before breakfast., Disp: , Rfl:    DULoxetine (CYMBALTA) 60 MG capsule, Take 1 capsule (60 mg total) by mouth daily., Disp: 90 capsule, Rfl: 0   estradiol (ESTRACE) 0.1 MG/GM vaginal cream, Apply 0.5mg  (pea-sized amount)  just inside the vaginal introitus with a finger-tip on Monday, Wednesday and Friday nights, (Patient taking differently: Place 1 Applicatorful vaginally every Monday, Wednesday, and Friday. Apply 0.5mg  (pea-sized amount)  just inside the vaginal introitus with a finger-tip on Monday, Wednesday and Friday nights,), Disp: 42.5 g, Rfl: 11   fesoterodine (TOVIAZ) 8 MG TB24 tablet, Take 1 tablet (8 mg total) by mouth daily., Disp: 90 tablet, Rfl: 3   fluticasone-salmeterol (ADVAIR HFA) 115-21 MCG/ACT inhaler, Inhale 2 puffs into the lungs daily. Rinse mouth thoroughly after use, Disp: 1 Inhaler, Rfl: 12   hydrochlorothiazide (HYDRODIURIL) 12.5 MG tablet, Take 1 tablet (12.5 mg total) by mouth daily. In am, Disp: 90 tablet, Rfl: 3   [START ON 02/03/2019] HYDROcodone-acetaminophen (NORCO/VICODIN) 5-325 MG tablet, Take 1 tablet by mouth every 8 (eight) hours as needed for severe pain. Must last 30 days, Disp: 90 tablet, Rfl: 0   levocetirizine (XYZAL) 5 MG tablet, Take 1 tablet (5 mg total) by mouth every evening., Disp: 90 tablet, Rfl: 3   levothyroxine (SYNTHROID, LEVOTHROID) 175 MCG tablet, Take 1 tablet (175 mcg total) by mouth  daily before breakfast., Disp: 90 tablet, Rfl: 3   losartan (COZAAR) 50 MG tablet, Take 1 tablet (50 mg total) by mouth daily. Do not take quinapril 40, Disp: 90 tablet, Rfl: 3   meloxicam (MOBIC) 15 MG tablet, Take 1 tablet (15 mg total) by mouth daily as needed for pain., Disp: 90 tablet, Rfl: 0   metFORMIN (GLUCOPHAGE) 1000  MG tablet, Take 1 tablet (1,000 mg total) by mouth 2 (two) times daily. With food, Disp: 180 tablet, Rfl: 3   metoprolol succinate (TOPROL-XL) 50 MG 24 hr tablet, Take 1 tablet (50 mg total) by mouth daily. PLEASE CALL [phone] TO SCHEDULE APPOINTMENT FOR FURTHER REFILLS., Disp: 30 tablet, Rfl: 0   montelukast (SINGULAIR) 10 MG tablet, Take 1 tablet (10 mg total) by mouth daily. (Patient taking differently: Take 10 mg by mouth at bedtime. ), Disp: 90 tablet, Rfl: 3   mupirocin ointment (BACTROBAN) 2 %, Apply 1 application topically 2 (two) times daily. Skin wounds (Patient taking differently: Apply 1 application topically 2 (two) times daily as needed (skin wounds). ), Disp: 30 g, Rfl: 0   nystatin cream (MYCOSTATIN), Apply 1 application topically 2 (two) times daily. Mouth prn for 7 days as needed (Patient taking differently: Apply 1 application topically 2 (two) times daily as needed for dry skin. Mouth prn for 7 days as needed), Disp: 30 g, Rfl: 11   ondansetron (ZOFRAN) 4 MG tablet, Take 1 tablet (4 mg total) by mouth every 8 (eight) hours as needed for nausea or vomiting., Disp: 30 tablet, Rfl: 0   oxybutynin (DITROPAN XL) 15 MG 24 hr tablet, TAKE ONE TABLET BY MOUTH AT BEDTIME (Patient taking differently: Take 15 mg by mouth at bedtime. ), Disp: 90 tablet, Rfl: 3   phenazopyridine (PYRIDIUM) 200 MG tablet, Take 1 tablet (200 mg total) by mouth 3 (three) times daily as needed for pain., Disp: 20 tablet, Rfl: 0   pregabalin (LYRICA) 50 MG capsule, Take 1 capsule (50 mg total) by mouth 3 (three) times daily., Disp: 90 capsule, Rfl: 2   sucralfate (CARAFATE) 1  GM/10ML suspension, Take 1 g by mouth 2 (two) times daily before a meal., Disp: , Rfl:    zinc oxide 20 % ointment, Apply 1 application topically as needed for irritation., Disp: 56.7 g, Rfl: 0  Current Facility-Administered Medications:    ipratropium-albuterol (DUONEB) 0.5-2.5 (3) MG/3ML nebulizer solution 3 mL, 3 mL, Nebulization, Q6H, [name], [name], MD   Objective:   Vitals:   01/24/19 1356  BP: 133/78  Pulse: 74  Temp: (!) 97.3 F (36.3 C)  SpO2: 95%    Physical Exam  General: alert, calm, obese, no acute distress HEENT: normocephalic Neck: supple, full ROM Chest: symmetrical rise and fall Lungs: unlabored breathing Cardiac: +2 bilateral radial pulse Musculoskeletal: NWB on right foot, Neuro: A&O x3, calm, cooperative, unsteady gait Extremities: left great toe overlapping left second toe, mild erythema and edema of left great toe Skin: 9 mm x 7 mm open wound on left heel, no surrounding erythema, edema, or drainage   Assessment & Plan:  Skin ulcer of left foot with fat layer exposed ([place])  Lajoy Hellams is a 68 yo female s/p excision of left foot wound with Acell placement on 11/09/18. The wound has decreased in size since her last visit. Will begin collagen treatment to the area. Order placed to [facility] today. The wound was covered with vaseline and gauze today. Patient advised to discuss shoe options to decrease pressure on the wound with her podiatrist today. Follow up in 6 weeks.   Picture placed in chart with patient's consent.  Alfredo Batty, NP

## 2019-01-25 ENCOUNTER — Telehealth: Payer: Self-pay | Admitting: *Deleted

## 2019-01-25 DIAGNOSIS — I87339 Chronic venous hypertension (idiopathic) with ulcer and inflammation of unspecified lower extremity: Secondary | ICD-10-CM | POA: Diagnosis not present

## 2019-01-25 NOTE — Telephone Encounter (Signed)
Faxed order to [facility] for supplies to be sent to the patient's home.  Confirmation received.//AB/CMA

## 2019-01-26 ENCOUNTER — Ambulatory Visit: Payer: PPO | Attending: Infectious Diseases | Admitting: Infectious Diseases

## 2019-01-26 ENCOUNTER — Other Ambulatory Visit: Payer: Self-pay

## 2019-01-26 ENCOUNTER — Encounter: Payer: Self-pay | Admitting: Infectious Diseases

## 2019-01-26 VITALS — BP 132/83 | HR 79 | Temp 97.8°F | Ht 65.5 in | Wt 252.0 lb

## 2019-01-26 DIAGNOSIS — Z1612 Extended spectrum beta lactamase (ESBL) resistance: Secondary | ICD-10-CM | POA: Diagnosis not present

## 2019-01-26 DIAGNOSIS — N3946 Mixed incontinence: Secondary | ICD-10-CM | POA: Diagnosis not present

## 2019-01-26 DIAGNOSIS — Z9884 Bariatric surgery status: Secondary | ICD-10-CM | POA: Diagnosis not present

## 2019-01-26 DIAGNOSIS — N390 Urinary tract infection, site not specified: Secondary | ICD-10-CM

## 2019-01-26 DIAGNOSIS — Z90710 Acquired absence of both cervix and uterus: Secondary | ICD-10-CM | POA: Diagnosis not present

## 2019-01-26 DIAGNOSIS — Z880 Allergy status to penicillin: Secondary | ICD-10-CM

## 2019-01-26 DIAGNOSIS — Z881 Allergy status to other antibiotic agents status: Secondary | ICD-10-CM

## 2019-01-26 DIAGNOSIS — E1151 Type 2 diabetes mellitus with diabetic peripheral angiopathy without gangrene: Secondary | ICD-10-CM

## 2019-01-26 DIAGNOSIS — Z96652 Presence of left artificial knee joint: Secondary | ICD-10-CM

## 2019-01-26 DIAGNOSIS — N3281 Overactive bladder: Secondary | ICD-10-CM

## 2019-01-26 DIAGNOSIS — F329 Major depressive disorder, single episode, unspecified: Secondary | ICD-10-CM

## 2019-01-26 DIAGNOSIS — E11621 Type 2 diabetes mellitus with foot ulcer: Secondary | ICD-10-CM

## 2019-01-26 DIAGNOSIS — Z87440 Personal history of urinary (tract) infections: Secondary | ICD-10-CM

## 2019-01-26 DIAGNOSIS — L97529 Non-pressure chronic ulcer of other part of left foot with unspecified severity: Secondary | ICD-10-CM | POA: Diagnosis not present

## 2019-01-26 DIAGNOSIS — Z8742 Personal history of other diseases of the female genital tract: Secondary | ICD-10-CM

## 2019-01-26 DIAGNOSIS — B961 Klebsiella pneumoniae [K. pneumoniae] as the cause of diseases classified elsewhere: Secondary | ICD-10-CM

## 2019-01-26 DIAGNOSIS — Z79899 Other long term (current) drug therapy: Secondary | ICD-10-CM

## 2019-01-26 DIAGNOSIS — Z886 Allergy status to analgesic agent status: Secondary | ICD-10-CM

## 2019-01-26 DIAGNOSIS — Z885 Allergy status to narcotic agent status: Secondary | ICD-10-CM

## 2019-01-26 DIAGNOSIS — Z87442 Personal history of urinary calculi: Secondary | ICD-10-CM

## 2019-01-26 NOTE — Progress Notes (Signed)
NAME: [name]  DOB: [birth year]  MRN: [id number]  Date/Time: 01/26/2019 9:40 AM  REQUESTING PROVIDER:  Aldona Bar valliancourt Subjective:  REASON FOR CONSULT: ESBL Klebsiella UTI Patient is here with her husband.  ? [name] is a 68 y.o. female with a history of gastric bypass, hysterectomy, cystocele and rectocele repair, venous edema legs, venous ulcers legs, left foot ulcer, mixed urinary incontinence both moderate stress incontinence and urge incontinence, recurrent UTI and being treated with multiple courses of antibiotics now leading to multidrug-resistant organism is referred to me for the same.   As per patient she had hysterectomy and cystocele and rectocele repair many years ago.  She is being followed by urologist .    She has seen Dr. McDiarmid urologist in August 2020 and in his notes he mentioned that she primarily had an overactive bladder and mixed incontinence.  She has been placed on oxybutynin and Toviaz.  There was a plan about percutaneous tibial nerve stimulation.  She had urodynamic study that showed a stable bladder capacity at 800 mL and Valsalva leak point pressure ranging between 38 and 80 cm of water.  There was no significant residual urine.  Whenever she visits her physicians a routine urine gets routinely checked for bacteria and then she gets an antibiotic.  Her last urine culture in September was ESBL Klebsiella.  She has received fosfomycin.  As per patient she improved her symptoms of dysuria, frequency and nocturia by drinking more water and using baby wipes to clean.  Today in my office her symptoms are much improved.  She is not on any antibiotics. She has had chronic urge and stress incontinence for many years and wears a depends.  She is not had sepsis or bacteremia due to urinary tract infection.  She has had renal stones and has had lithotripsy. She also has a chronic ulcer on her left foot and is followed by Dr. [name] and plastic surgeon.  This  looks more like a pressure ulcer. Recently she twisted on her right foot and that is also painful. She also has chronic history of edema legs with venous stasis and cellulitis and ulceration at times.  But nothing active today. She seldom gets any fever she says She underwent a gastric bypass and lost a lot of weight but because of her being in wheelchair now and has been very poor mobility she has gained a few pounds back. Past Medical History:  Diagnosis Date  . Abnormal antibody titer   . Anxiety and depression   . Arthritis   . Asthma   . Cystocele   . Depression   . Diabetes ([place])   . DVT (deep venous thrombosis) ([facility])    Righ calf  . Dyspnea    11/08/2018 - "more now due to lack of exercise"  . Dysrhythmia    afib  . Edema   . GERD (gastroesophageal reflux disease)   . Heart murmur    Echocardiogram June 2015: Mild MR (possible vegetation seen on TTE, not seen on TEE), normal LV size with moderate concentric LVH. Normal function EF 60-65%. Normal diastolic function. Mild LA dilation.  Marland Kitchen History of IBS   . History of kidney stones   . History of methicillin resistant staphylococcus aureus (MRSA) 2008  . Hyperlipidemia   . Hypertension   . Hypothyroidism   . Insomnia   . Kidney stone    kidney stones with lithotripsy .  [facility]- "adrenal glands"  . Neuropathy involving both lower  extremities   . Obesity, Class II, BMI 35-39.9, with comorbidity   . Paroxysmal atrial fibrillation ([place]) 2007   In June 2015, Cardiac Event Monitor: Mostly SR/sinus arrhythmia with PVCs that are frequent. Short bursts of A. fib lasting several minutes;; CHA2DS2-VASc Score = 5 (age, Female, PVD, DM, HTN)  . Peripheral vascular disease ([place])   . Sleep apnea    use C-PAP  . Urinary incontinence   . Venous stasis dermatitis of both lower extremities     Past Surgical History:  Procedure Laterality Date  . ANKLE SURGERY Right   . APPLICATION OF WOUND VAC Left 06/27/2015   Procedure:  APPLICATION OF WOUND VAC ( POSSIBLE ) ;  Surgeon: [name], MD;  Location: [facility] ORS;  Service: Vascular;  Laterality: Left;  . APPLICATION OF WOUND VAC Left 09/25/2016   Procedure: APPLICATION OF WOUND VAC;  Surgeon: [name], DPM;  Location: [facility];  Service: Podiatry;  Laterality: Left;  . arm surgery     right    . CHOLECYSTECTOMY    . COLONOSCOPY    . COLONOSCOPY WITH PROPOFOL N/A 01/11/2017   Procedure: COLONOSCOPY WITH PROPOFOL;  Surgeon: [name], MD;  Location: [facility] ENDOSCOPY;  Service: Endoscopy;  Laterality: N/A;  . COLONOSCOPY WITH PROPOFOL N/A 02/22/2017   Procedure: COLONOSCOPY WITH PROPOFOL;  Surgeon: [name], MD;  Location: [facility] ENDOSCOPY;  Service: Endoscopy;  Laterality: N/A;  . COLONOSCOPY WITH PROPOFOL N/A 05/31/2017   Procedure: COLONOSCOPY WITH PROPOFOL;  Surgeon: [name], MD;  Location[facility];  Service: Endoscopy;  Laterality: N/A;  . ESOPHAGOGASTRODUODENOSCOPY (EGD) WITH PROPOFOL N/A 01/11/2017   Procedure: ESOPHAGOGASTRODUODENOSCOPY (EGD) WITH PROPOFOL;  Surgeon: [name], MD;  Location: [facility] ENDOSCOPY;  Service: Endoscopy;  Laterality: N/A;  . ESOPHAGOGASTRODUODENOSCOPY (EGD) WITH PROPOFOL N/A 10/25/2018   Procedure: ESOPHAGOGASTRODUODENOSCOPY (EGD) WITH PROPOFOL;  Surgeon: [name], MD;  Location: [facility] ENDOSCOPY;  Service: Endoscopy;  Laterality: N/A;  . HERNIA REPAIR     umbilical  . HIATAL HERNIA REPAIR    . I&D EXTREMITY Left 06/27/2015   Procedure: IRRIGATION AND DEBRIDEMENT EXTREMITY            ( CALF HEMATOMA ) POSSIBLE WOUND VAC;  Surgeon: [name], MD;  Location: [facility] ORS;  Service: Vascular;  Laterality: Left;  . INCISION AND DRAINAGE OF WOUND Left 09/25/2016   Procedure: IRRIGATION AND DEBRIDEMENT - PARTIAL RESECTION OF ACHILLES TENDON WITH WOUND VAC APPLICATION;  Surgeon: [name], DPM;  Location: [facility];  Service: Podiatry;  Laterality: Left;  . INCISION AND DRAINAGE OF WOUND  Left 11/09/2018   Procedure: Excision of left foot wound with ACell placement;  Surgeon: Wallace Going, DO;  Location: [place];  Service: Plastics;  Laterality: Left;  . IRRIGATION AND DEBRIDEMENT ABSCESS Left 09/07/2016   Procedure: IRRIGATION AND DEBRIDEMENT ABSCESS LEFT HEEL;  Surgeon: [name], DPM;  Location: [facility];  Service: Podiatry;  Laterality: Left;  . JOINT REPLACEMENT  2013   left knee replacement  . KIDNEY SURGERY Right    kidney stones  . LITHOTRIPSY    . LOWER EXTREMITY ANGIOGRAPHY Left 04/04/2018   Procedure: LOWER EXTREMITY ANGIOGRAPHY;  Surgeon: [name], MD;  Location: [place] [facility];  Service: Cardiovascular;  Laterality: Left;  . NM GATED MYOVIEW ([place] [facility])  February 2017   Likely breast attenuation. LOW RISK study. Normal EF 55-60%.  . OTHER SURGICAL HISTORY     08/2016 or 09/2016 surgery on achilles  tenso h/o staph infection heal. Dr. [name]  . removal of left hematoma Left    leg  . REVERSE SHOULDER ARTHROPLASTY Right 10/08/2015   Procedure: REVERSE SHOULDER ARTHROPLASTY;  Surgeon: [name], MD;  Location: [facility];  Service: Orthopedics;  Laterality: Right;  . SLEEVE GASTROPLASTY    . TONSILLECTOMY    . TOTAL KNEE ARTHROPLASTY Left   . TOTAL SHOULDER REPLACEMENT Right   . TRANSESOPHAGEAL ECHOCARDIOGRAM  08/08/2013   Mild LVH, EF 60-65%. Moderate LA dilation and mild RA dilation. Mild MR with no evidence of stenosis and no evidence of endocarditis. A false chordae is noted.  . TRANSTHORACIC ECHOCARDIOGRAM  08/03/2013   Mild-Moderate concentric LVH, EF 60-65%. Normal diastolic function. Mild LA dilation. Mild MR with possible vegetation  - not confirmed on TEE   . ULNAR NERVE TRANSPOSITION Right 08/06/2016   Procedure: ULNAR NERVE DECOMPRESSION/TRANSPOSITION;  Surgeon: [name], MD;  Location: [facility];  Service: Orthopedics;  Laterality: Right;  . UPPER GI ENDOSCOPY    . VAGINAL HYSTERECTOMY      Social History   Socioeconomic  History  . Marital status: Married    Spouse name: Not on file  . Number of children: Not on file  . Years of education: Not on file  . Highest education level: Not on file  Occupational History  . Not on file  Social Needs  . Financial resource strain: Not on file  . Food insecurity    Worry: Not on file    Inability: Not on file  . Transportation needs    Medical: Not on file    Non-medical: Not on file  Tobacco Use  . Smoking status: Never Smoker  . Smokeless tobacco: Never Used  Substance and Sexual Activity  . Alcohol use: No    Alcohol/week: 0.0 standard drinks  . Drug use: Never  . Sexual activity: Yes  Lifestyle  . Physical activity    Days per week: Not on file    Minutes per session: Not on file  . Stress: Not on file  Relationships  . Social Herbalist on phone: Not on file    Gets together: Not on file    Attends religious service: Not on file    Active member of club or organization: Not on file    Attends meetings of clubs or organizations: Not on file    Relationship status: Not on file  . Intimate partner violence    Fear of current or ex partner: Not on file    Emotionally abused: Not on file    Physically abused: Not on file    Forced sexual activity: Not on file  Other Topics Concern  . Not on file  Social History Narrative   She is currently married -- for 30+ years. Does not work. Does not smoke or take alcohol. She never smoked. She exercises at least 3 days a week since before her gastric surgery.   Marital status reviewed in history of present illness.   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 Skin cancer Father   . Diabetes Father   . Hypertension Father   . Peripheral vascular disease Father   . Cancer Father   . Cerebral aneurysm Father   . Alcohol abuse Father   . Varicose Veins Mother   .  Kidney disease Mother    . Arthritis Mother   . Mental illness Sister   . Cancer Maternal Aunt        breast  . Breast cancer Maternal Aunt   . Arthritis Maternal Grandmother   . Hypertension Maternal Grandmother   . Diabetes Maternal Grandmother   . Arthritis Maternal Grandfather   . Heart disease Maternal Grandfather   . Hypertension Maternal Grandfather   . Arthritis Paternal Grandmother   . Hypertension Paternal Grandmother   . Diabetes Paternal Grandmother   . Arthritis Paternal Grandfather   . Hypertension Paternal Grandfather   . Bladder Cancer Neg Hx   . Kidney cancer Neg Hx    Allergies  Allergen Reactions  . Morphine And Related Other (See Comments) and Nausea Only    Patient becomes very confused Other reaction(s): Other (See Comments), Other (See Comments) Patient becomes very confused Patient becomes very confused   . Penicillins Hives, Shortness Of Breath, Swelling and Other (See Comments)    Facial swelling Has patient had a PCN reaction causing immediate rash, facial/tongue/throat swelling, SOB or lightheadedness with hypotension: Yes Has patient had a PCN reaction causing severe rash involving mucus membranes or skin necrosis: Yes Has patient had a PCN reaction that required hospitalization No Has patient had a PCN reaction occurring within the last 10 years: No If all of the above answers are "NO", then may proceed with Cephalosporin use.  Other reaction(s): Other (See Comments), Other (See Comments) Facial swelling Has patient had a PCN reaction causing immediate rash, facial/tongue/throat swelling, SOB or lightheadedness with hypotension: Yes Has patient had a PCN reaction causing severe rash involving mucus membranes or skin necrosis: Yes Has patient had a PCN reaction that required hospitalization No Has patient had a PCN reaction occurring within the last 10 years: No If all of the above answers are "NO", then may proceed with Cephalosporin use. Facial swelling Has patient  had a PCN reaction causing immediate rash, facial/tongue/throat swelling, SOB or lightheadedness with hypotension: Yes Has patient had a PCN reaction causing severe rash involving mucus membranes or skin necrosis: Yes Has patient had a PCN reaction that required hospitalization No Has patient had a PCN reaction occurring within the last 10 years: No If all of the above answers are "NO", then may proceed with Cephalosporin use. Facial swelling Has patient had a PCN reaction causing immediate rash, facial/tongue/throat swelling, SOB or lightheadedness with hypotension: Yes Has patient had a PCN reaction causing severe rash involving mucus membranes or skin necrosis: Yes Has patient had a PCN reaction that required hospitalization No Has patient had a PCN reaction occurring within the last 10 years: No If all of the above answers are "NO", then may proceed with Cephalosporin use.   . Aspirin Hives  . Oxytetracycline Hives    Other reaction(s): Unknown     ? Current Outpatient Medications  Medication Sig Dispense Refill  . albuterol (PROVENTIL HFA;VENTOLIN HFA) 108 (90 Base) MCG/ACT inhaler Inhale 1-2 puffs into the lungs every 4 (four) hours as needed for wheezing or shortness of breath. (Patient taking differently: Inhale 2 puffs into the lungs every 4 (four) hours as needed for wheezing or shortness of breath. ) 8 g 2  . albuterol (PROVENTIL) (2.5 MG/3ML) 0.083% nebulizer solution Take 3 mLs (2.5 mg total) by nebulization every 6 (six) hours as needed for wheezing or shortness of breath. 150 mL 1  . ALPRAZolam (XANAX) 0.5 MG tablet Take 1 tablet (0.5 mg total) by mouth  2 (two) times daily as needed for anxiety. 60 tablet 2  . amLODipine (NORVASC) 5 MG tablet Take 1 tablet (5 mg total) by mouth daily. 90 tablet 3  . buPROPion (WELLBUTRIN XL) 150 MG 24 hr tablet Take 1 tablet (150 mg total) by mouth daily. 90 tablet 3  . clobetasol cream (TEMOVATE) [id number] % Apply 1 application topically 2 (two)  times daily. To arms (Patient taking differently: Apply 1 application topically 2 (two) times daily as needed (irritation). To arms) 60 g 1  . dabigatran (PRADAXA) 150 MG CAPS capsule Take 1 capsule (150 mg total) by mouth 2 (two) times daily. 180 capsule 1  . Dexlansoprazole (DEXILANT) 30 MG capsule Take 1 capsule (30 mg total) by mouth daily. Note lower maintenance dose take 30 min before food lunch or dinner not with thyroid medication 90 capsule 3  . dexlansoprazole (DEXILANT) 60 MG capsule Take 60 mg by mouth daily before breakfast.    . DULoxetine (CYMBALTA) 60 MG capsule Take 1 capsule (60 mg total) by mouth daily. 90 capsule 0  . estradiol (ESTRACE) 0.1 MG/GM vaginal cream Apply 0.5mg  (pea-sized amount)  just inside the vaginal introitus with a finger-tip on Monday, Wednesday and Friday nights, (Patient taking differently: Place 1 Applicatorful vaginally every Monday, Wednesday, and Friday. Apply 0.5mg  (pea-sized amount)  just inside the vaginal introitus with a finger-tip on Monday, Wednesday and Friday nights,) 42.5 g 11  . fesoterodine (TOVIAZ) 8 MG TB24 tablet Take 1 tablet (8 mg total) by mouth daily. 90 tablet 3  . fluticasone-salmeterol (ADVAIR HFA) 115-21 MCG/ACT inhaler Inhale 2 puffs into the lungs daily. Rinse mouth thoroughly after use 1 Inhaler 12  . hydrochlorothiazide (HYDRODIURIL) 12.5 MG tablet Take 1 tablet (12.5 mg total) by mouth daily. In am 90 tablet 3  . [START ON 02/03/2019] HYDROcodone-acetaminophen (NORCO/VICODIN) 5-325 MG tablet Take 1 tablet by mouth every 8 (eight) hours as needed for severe pain. Must last 30 days 90 tablet 0  . levocetirizine (XYZAL) 5 MG tablet Take 1 tablet (5 mg total) by mouth every evening. 90 tablet 3  . levothyroxine (SYNTHROID, LEVOTHROID) 175 MCG tablet Take 1 tablet (175 mcg total) by mouth daily before breakfast. 90 tablet 3  . losartan (COZAAR) 50 MG tablet Take 1 tablet (50 mg total) by mouth daily. Do not take quinapril 40 90 tablet 3   . meloxicam (MOBIC) 15 MG tablet Take 1 tablet (15 mg total) by mouth daily as needed for pain. 90 tablet 0  . metFORMIN (GLUCOPHAGE) 1000 MG tablet Take 1 tablet (1,000 mg total) by mouth 2 (two) times daily. With food 180 tablet 3  . metoprolol succinate (TOPROL-XL) 50 MG 24 hr tablet Take 1 tablet (50 mg total) by mouth daily. PLEASE CALL [phone] TO SCHEDULE APPOINTMENT FOR FURTHER REFILLS. 30 tablet 0  . montelukast (SINGULAIR) 10 MG tablet Take 1 tablet (10 mg total) by mouth daily. (Patient taking differently: Take 10 mg by mouth at bedtime. ) 90 tablet 3  . mupirocin ointment (BACTROBAN) 2 % Apply 1 application topically 2 (two) times daily. Skin wounds (Patient taking differently: Apply 1 application topically 2 (two) times daily as needed (skin wounds). ) 30 g 0  . nystatin cream (MYCOSTATIN) Apply 1 application topically 2 (two) times daily. Mouth prn for 7 days as needed (Patient taking differently: Apply 1 application topically 2 (two) times daily as needed for dry skin. Mouth prn for 7 days as needed) 30 g 11  . ondansetron (ZOFRAN)  4 MG tablet Take 1 tablet (4 mg total) by mouth every 8 (eight) hours as needed for nausea or vomiting. 30 tablet 0  . oxybutynin (DITROPAN XL) 15 MG 24 hr tablet TAKE ONE TABLET BY MOUTH AT BEDTIME (Patient taking differently: Take 15 mg by mouth at bedtime. ) 90 tablet 3  . oxyCODONE (OXY IR/ROXICODONE) 5 MG immediate release tablet oxycodone 5 mg tablet    . phenazopyridine (PYRIDIUM) 200 MG tablet Take 1 tablet (200 mg total) by mouth 3 (three) times daily as needed for pain. 20 tablet 0  . pregabalin (LYRICA) 50 MG capsule Take 1 capsule (50 mg total) by mouth 3 (three) times daily. 90 capsule 2  . sucralfate (CARAFATE) 1 GM/10ML suspension Take 1 g by mouth 2 (two) times daily before a meal.    . zinc oxide 20 % ointment Apply 1 application topically as needed for irritation. 56.7 g 0   Current Facility-Administered Medications  Medication Dose  Route Frequency Provider Last Rate Last Dose  . ipratropium-albuterol (DUONEB) 0.5-2.5 (3) MG/3ML nebulizer solution 3 mL  3 mL Nebulization Q6H [name], [name], MD         Abtx:  Anti-infectives (From admission, onward)   None      REVIEW OF SYSTEMS:  Const: negative fever, negative chills, gaining some weight Eyes: negative diplopia or visual changes, negative eye pain ENT: negative coryza, negative sore throat Resp: Baseline cough, hemoptysis, dyspnea Cards: negative for chest pain, palpitations, lower extremity edema GU: Has  incontinence GI: Negative for abdominal pain, diarrhea, bleeding, constipation Skin: Intermittent leg ulcers Heme: negative for easy bruising and gum/nose bleeding MS: No back pain, has bilateral foot pain Neurolo:negative for headaches, dizziness, vertigo, memory problems  Psych: Has depression Endocrine: Has diabetes Allergy/Immunology-penicillin allergy but cannot tolerate cephalosporins Objective:  VITALS:  BP 132/83   Pulse 79   Temp 97.8 F (36.6 C)   Ht 5' 5.5" (1.664 m)   Wt 252 lb (114.3 kg)   SpO2 93%   BMI 41.30 kg/m  PHYSICAL EXAM:  General: Alert, cooperative, no distress, appears stated age.  In wheelchair, pale Head: Normocephalic, without obvious abnormality, atraumatic. Eyes: Conjunctivae clear, anicteric sclerae. Pupils are equal ENT did not examine because of mask. Neck: Supple,  Back: No CVA tenderness. Lungs: Clear to auscultation bilaterally. No Wheezing or Rhonchi. No rales. Heart: S1-S2. Abdomen: Soft, non-tender,not distended. Extremities: Dressing on the left foot not removed Bilateral chronic edema of the legs.  Venous staining in both legs.  No obvious ulcer present. Reviewed the picture of the left foot.  Has wound on the calcaneal area on the dorsum which is very clean and with granulating tissue. Skin: No rashes or lesions. Or bruising Lymph: Cervical, supraclavicular normal. Neurologic: Grossly  non-focal Pertinent Labs Lab Results CBC    Component Value Date/Time   WBC 5.9 11/09/2018 0948   RBC 4.32 11/09/2018 0948   HGB 12.3 11/09/2018 0948   HGB 12.5 10/10/2014 0822   HCT 38.2 11/09/2018 0948   HCT 36.3 10/10/2014 0822   PLT 283 11/09/2018 0948   PLT 263 10/10/2014 0822   MCV 88.4 11/09/2018 0948   MCV 83 10/10/2014 0822   MCV 94 09/28/2013 0510   MCH 28.5 11/09/2018 0948   MCHC 32.2 11/09/2018 0948   RDW 12.9 11/09/2018 0948   RDW 14.8 10/10/2014 0822   RDW 13.4 09/28/2013 0510   LYMPHSABS 1.0 09/28/2018 1603   LYMPHSABS 1.2 10/10/2014 0822   LYMPHSABS 1.4 09/28/2013 0510  MONOABS 0.6 09/28/2018 1603   MONOABS 0.5 09/28/2013 0510   EOSABS 0.3 09/28/2018 1603   EOSABS 0.4 10/10/2014 0822   EOSABS 0.4 09/28/2013 0510   BASOSABS 0.1 09/28/2018 1603   BASOSABS 0.1 10/10/2014 0822   BASOSABS 0.1 09/28/2013 0510   BASOSABS 0 09/29/2012 1500    CMP Latest Ref Rng & Units 11/09/2018 09/28/2018 07/29/2018  Glucose 70 - 99 mg/dL 117(H) 127(H) 106(H)  BUN 8 - 23 mg/dL 21 20 21   Creatinine 0.44 - 1.00 mg/dL 1.22(H) 1.15(H) 1.13(H)  Sodium 135 - 145 mmol/L 140 140 141  Potassium 3.5 - 5.1 mmol/L 4.6 5.0 4.5  Chloride 98 - 111 mmol/L 105 106 109  CO2 22 - 32 mmol/L 26 26 24   Calcium 8.9 - 10.3 mg/dL 9.2 8.9 8.6(L)  Total Protein 6.5 - 8.1 g/dL - 6.6 -  Total Bilirubin 0.3 - 1.2 mg/dL - 0.3 -  Alkaline Phos 38 - 126 U/L - 84 -  AST 15 - 41 U/L - 18 -  ALT 0 - 44 U/L - 13 -      Microbiology: 12/06/2018 urine culture ESBL Klebsiella pneumonia   11/18/2018 urine culture ESBL E. coli 10/24/2018 Pseudomonas in the urine 07/29/2018 ESBL E. Coli Since 2018 patient has had urine culture positive for colonic bacteria   IMAGING RESULTS: No recent images ? Impression/Recommendation ?68 y.o. female with a history of gastric bypass, hysterectomy, cystocele and rectocele repair, venous edema legs, venous ulcers legs, left foot ulcer, mixed urinary incontinence both moderate  stress incontinence and urge incontinence, recurrent UTI and being treated with multiple courses of antibiotics now leading to multidrug-resistant organism is referred to me for the same.    Multidrug-resistant organisms in the urine.  UTI versus asymptomatic bacteriuria patient has had a positive urine cultures since the past 2 years.  Most of the organisms found in the urine have been fecal organisms like E. coli, Klebsiella or Enterococcus.  Patient symptoms of incontinence, frequency, dysuria could be explained by her underlying condition of urge and stress incontinence symptoms due to pelvic floor weakness, genitourinary symptoms of menopause.  Unfortunately her urine is checked every visit to her physicians and invariably a bacteria is detected.   she then gets antibiotics leading to multidrug-resistant organism. Her symptoms of dysuria can also be from vaginal yeast which could be a consequence of recurrent antibiotic use.  I explained to the patient that 50% of menopausal woman can have bacteria in the urine and the does not necessarily mean an infection and her symptoms of incontinence, frequency and burning need not be due to an infection but could be from genitourinary menopausal syndrome as well as local yeast infection and will you have other reasons including not hydrating well. She is also at risk for the symptoms because she is immobile a lot and unable to take care of her personal hygiene well.  I recommend not checking her urine routinely I recommend not treating bacteria in the urine even if she has dysuria or incontinence unless she has other symptoms like flank pain or fever or she is going to undergo a cystoscopy. ? I gave her information and education about the following as well   *Estrace /Premarin cream topically- peasized apply topically three times a week * Cetaphil to clean the genital area ( not soap)  * Cranberry supplement (-Knudsen cranberry concentrate- 1 ounce mixed  with 8 ounces of water *wash with water after bowel movt *Probiotic for vaginal health( can try Pearls  vaginal health) * Increase water consumption- 8 glasses a day *Ask your doctors not to check your urine on a routine basis  *Avoid antibiotics unless systemic infection/ or before cystoscopy * Kegel Exercise to strengthen pelvic floor?  Bilateral venous edema and venous dermatitis. Asked her to use an Ace wrap as she is unable to pull a compression stocking.  Chronic left foot ulcer followed by the podiatrist.  History of left knee arthroplasty. ___________________________________________________ Discussed with patient, and her husband in great detail Spent 50% of the time counseling about her condition, pathology, preventing early urinary infection, preventing C. difficile and other complications from chronic antibiotic use. Note:  This document was prepared using Dragon voice recognition software and may include unintentional dictation errors.

## 2019-01-26 NOTE — Patient Instructions (Addendum)
You have been referred to me for recurrent UTI. Your symptoms of increased frequency , burning, incontinence  can be due to Genitourinary symptoms of menopause. I would recommend not testing your urine every visit. I recommend not treating the bacteria in the urine.as  a lot of women after menopause can have bacteria in the urine . Also with frequent antibiotics you can get yeast infection which can cause dysuria. As your bladder  scan shows no residual urine you dont have retention. You have also developed Multi drug resistant organisms due to recurrent antibiotic use. If you develop constant  flank pain, or fever then you need to check the urine and may need antibiotics  Please do the following  *Estrace /Premarin cream topically- peasized apply topically three times a week * Cetaphil to clean the genital area ( not soap)  * Cranberry supplement (-Knudsen cranberry concentrate- 1 ounce mixed with 8 ounces of water *wash with water after bowel movt *Probiotic for vaginal health( can try Pearls vaginal health) * Increase water consumption- 8 glasses a day *Ask your doctors not to check your urine on a routine basis  *Avoid antibiotics unless systemic infection/ or before cystoscopy * Kegel Exercise to strengthen pelvic floor

## 2019-01-31 DIAGNOSIS — L97521 Non-pressure chronic ulcer of other part of left foot limited to breakdown of skin: Secondary | ICD-10-CM | POA: Diagnosis not present

## 2019-01-31 DIAGNOSIS — E1143 Type 2 diabetes mellitus with diabetic autonomic (poly)neuropathy: Secondary | ICD-10-CM | POA: Diagnosis not present

## 2019-01-31 DIAGNOSIS — E1159 Type 2 diabetes mellitus with other circulatory complications: Secondary | ICD-10-CM | POA: Diagnosis not present

## 2019-02-01 ENCOUNTER — Other Ambulatory Visit: Payer: Self-pay | Admitting: Urology

## 2019-02-03 ENCOUNTER — Other Ambulatory Visit: Payer: Self-pay | Admitting: Internal Medicine

## 2019-02-03 DIAGNOSIS — J4540 Moderate persistent asthma, uncomplicated: Secondary | ICD-10-CM

## 2019-02-03 MED ORDER — MONTELUKAST SODIUM 10 MG PO TABS: 10.0000 mg | ORAL_TABLET | Freq: Every day | ORAL | 3 refills | Status: DC

## 2019-02-12 ENCOUNTER — Other Ambulatory Visit: Payer: Self-pay | Admitting: Internal Medicine

## 2019-02-12 DIAGNOSIS — I10 Essential (primary) hypertension: Secondary | ICD-10-CM

## 2019-02-12 MED ORDER — HYDROCHLOROTHIAZIDE 12.5 MG PO TABS: 12.5000 mg | ORAL_TABLET | Freq: Every day | ORAL | 3 refills | Status: DC

## 2019-02-14 ENCOUNTER — Encounter: Payer: Self-pay | Admitting: Internal Medicine

## 2019-02-14 ENCOUNTER — Ambulatory Visit: Payer: PPO | Admitting: Internal Medicine

## 2019-02-14 ENCOUNTER — Other Ambulatory Visit: Payer: Self-pay

## 2019-02-14 VITALS — Ht 65.5 in | Wt 254.0 lb

## 2019-02-14 DIAGNOSIS — M1990 Unspecified osteoarthritis, unspecified site: Secondary | ICD-10-CM | POA: Insufficient documentation

## 2019-02-14 DIAGNOSIS — F329 Major depressive disorder, single episode, unspecified: Secondary | ICD-10-CM

## 2019-02-14 DIAGNOSIS — M25571 Pain in right ankle and joints of right foot: Secondary | ICD-10-CM

## 2019-02-14 DIAGNOSIS — L97522 Non-pressure chronic ulcer of other part of left foot with fat layer exposed: Secondary | ICD-10-CM

## 2019-02-14 DIAGNOSIS — E1143 Type 2 diabetes mellitus with diabetic autonomic (poly)neuropathy: Secondary | ICD-10-CM | POA: Diagnosis not present

## 2019-02-14 DIAGNOSIS — L97521 Non-pressure chronic ulcer of other part of left foot limited to breakdown of skin: Secondary | ICD-10-CM | POA: Diagnosis not present

## 2019-02-14 DIAGNOSIS — E119 Type 2 diabetes mellitus without complications: Secondary | ICD-10-CM

## 2019-02-14 DIAGNOSIS — F419 Anxiety disorder, unspecified: Secondary | ICD-10-CM

## 2019-02-14 DIAGNOSIS — I10 Essential (primary) hypertension: Secondary | ICD-10-CM

## 2019-02-14 DIAGNOSIS — E039 Hypothyroidism, unspecified: Secondary | ICD-10-CM

## 2019-02-14 DIAGNOSIS — G8929 Other chronic pain: Secondary | ICD-10-CM | POA: Insufficient documentation

## 2019-02-14 DIAGNOSIS — R112 Nausea with vomiting, unspecified: Secondary | ICD-10-CM | POA: Insufficient documentation

## 2019-02-14 MED ORDER — MELOXICAM 15 MG PO TABS: 15.0000 mg | ORAL_TABLET | Freq: Every day | ORAL | 0 refills | Status: DC | PRN

## 2019-02-14 MED ORDER — ONDANSETRON HCL 4 MG PO TABS: 4.0000 mg | ORAL_TABLET | Freq: Three times a day (TID) | ORAL | 1 refills | Status: AC | PRN

## 2019-02-14 NOTE — Progress Notes (Signed)
telephone Note  I connected with [name]  on 02/14/19 at  9:30 AM EST by telephone and verified that I am speaking with the correct person using two identifiers.  Location patient: home Location provider:work or home office Persons participating in the virtual visit: patient, provider, pts husband  I discussed the limitations of evaluation and management by telemedicine and the availability of in person appointments. The patient expressed understanding and agreed to proceed.   HPI: 1. HTN not checked today last 01/26/2019 132/82 on norvasc 5, hctz 12.5, cozaar 50 mg toprol 50 mg qd   2. C/o nausea with food after eating doing dexilant 60 mg per [facility] and carafate bid and still having nausea she reports will call them for f/u in 03/2019 if sx's still persists   3. Anxiety/depression uncontrolled on prn xanax 0.5, wellbutrin 150 mg xl, cymbalta 60 mg qd declines therapy for now but wants to see possibly in the future   4. Right ankle pain since had misstep since 10/2018 or before with Xray 01/24/2019 noted + arthritis pain with stepping has appt with Dr. [name] today 11 am she may need MRI in the future going forward  She now has a ramp at her home to help get in and out of house   5. Left heel wound x 2 years doing better with collagen pads and healing better per pt f/u Dr. Leighton Roach [facility] and wound doing better   ROS: See pertinent positives and negatives per HPI.  Past Medical History:  Diagnosis Date  . Abnormal antibody titer   . Anxiety and depression   . Arthritis   . Asthma   . Cystocele   . Depression   . Diabetes ([place])   . DVT (deep venous thrombosis) ([facility])    Righ calf  . Dyspnea    11/08/2018 - "more now due to lack of exercise"  . Dysrhythmia    afib  . Edema   . GERD (gastroesophageal reflux disease)   . Heart murmur    Echocardiogram June 2015: Mild MR (possible vegetation seen on TTE, not seen on TEE), normal LV size with moderate concentric LVH.  Normal function EF 60-65%. Normal diastolic function. Mild LA dilation.  Marland Kitchen History of IBS   . History of kidney stones   . History of methicillin resistant staphylococcus aureus (MRSA) 2008  . Hyperlipidemia   . Hypertension   . Hypothyroidism   . Insomnia   . Kidney stone    kidney stones with lithotripsy .  [facility]- "adrenal glands"  . Neuropathy involving both lower extremities   . Obesity, Class II, BMI 35-39.9, with comorbidity   . Paroxysmal atrial fibrillation ([place]) 2007   In June 2015, Cardiac Event Monitor: Mostly SR/sinus arrhythmia with PVCs that are frequent. Short bursts of A. fib lasting several minutes;; CHA2DS2-VASc Score = 5 (age, Female, PVD, DM, HTN)  . Peripheral vascular disease ([place])   . Sleep apnea    use C-PAP  . Urinary incontinence   . Venous stasis dermatitis of both lower extremities     Past Surgical History:  Procedure Laterality Date  . ANKLE SURGERY Right   . APPLICATION OF WOUND VAC Left 06/27/2015   Procedure: APPLICATION OF WOUND VAC ( POSSIBLE ) ;  Surgeon: [name], MD;  Location: [facility] ORS;  Service: Vascular;  Laterality: Left;  . APPLICATION OF WOUND VAC Left 09/25/2016   Procedure: APPLICATION OF WOUND VAC;  Surgeon: [name], DPM;  Location:  [facility];  Service: Podiatry;  Laterality: Left;  . arm surgery     right    . CHOLECYSTECTOMY    . COLONOSCOPY    . COLONOSCOPY WITH PROPOFOL N/A 01/11/2017   Procedure: COLONOSCOPY WITH PROPOFOL;  Surgeon: [name], MD;  Location: [facility] ENDOSCOPY;  Service: Endoscopy;  Laterality: N/A;  . COLONOSCOPY WITH PROPOFOL N/A 02/22/2017   Procedure: COLONOSCOPY WITH PROPOFOL;  Surgeon: [name], MD;  Location: [facility] ENDOSCOPY;  Service: Endoscopy;  Laterality: N/A;  . COLONOSCOPY WITH PROPOFOL N/A 05/31/2017   Procedure: COLONOSCOPY WITH PROPOFOL;  Surgeon: [name], MD;  Location: [facility] ENDOSCOPY;  Service: Endoscopy;  Laterality: N/A;  . ESOPHAGOGASTRODUODENOSCOPY  (EGD) WITH PROPOFOL N/A 01/11/2017   Procedure: ESOPHAGOGASTRODUODENOSCOPY (EGD) WITH PROPOFOL;  Surgeon: [name], MD;  Location: [facility] ENDOSCOPY;  Service: Endoscopy;  Laterality: N/A;  . ESOPHAGOGASTRODUODENOSCOPY (EGD) WITH PROPOFOL N/A 10/25/2018   Procedure: ESOPHAGOGASTRODUODENOSCOPY (EGD) WITH PROPOFOL;  Surgeon: [name], MD;  Location: [facility] END[facility]COPY;  Service: Endoscopy;  Laterality: N/A;  . HERNIA REPAIR     umbilical  . HIATAL HERNIA REPAIR    . I&D EXTREMITY Left 06/27/2015   Procedure: IRRIGATION AND DEBRIDEMENT EXTREMITY            ( CALF HEMATOMA ) POSSIBLE WOUND VAC;  Surgeon: [name], MD;  Location: [facility];  Service: Vascular;  Laterality: Left;  . INCISION AND DRAINAGE OF WOUND Left 09/25/2016   Procedure: IRRIGATION AND DEBRIDEMENT - PARTIAL RESECTION OF ACHILLES TENDON WITH WOUND VAC APPLICATION;  Surgeon: [name], DPM;  Location: [facility];  Service: Podiatry;  Laterality: Left;  . INCISION AND DRAINAGE OF WOUND Left 11/09/2018   Procedure: Excision of left foot wound with ACell placement;  Surgeon: Wallace Going, DO;  Location: [place];  Service: Plastics;  Laterality: Left;  . IRRIGATION AND DEBRIDEMENT ABSCESS Left 09/07/2016   Procedure: IRRIGATION AND DEBRIDEMENT ABSCESS LEFT HEEL;  Surgeon: [name], DPM;  Location: [facility];  Service: Podiatry;  Laterality: Left;  . JOINT REPLACEMENT  2013   left knee replacement  . KIDNEY SURGERY Right    kidney stones  . LITHOTRIPSY    . LOWER EXTREMITY ANGIOGRAPHY Left 04/04/2018   Procedure: LOWER EXTREMITY ANGIOGRAPHY;  Surgeon: [name], MD;  Location: [facility];  Service: Cardiovascular;  Laterality: Left;  . NM GATED MYOVIEW ([place] [facility])  February 2017   Likely breast attenuation. LOW RISK study. Normal EF 55-60%.  . OTHER SURGICAL HISTORY     08/2016 or 09/2016 surgery on achilles tenso h/o staph infection heal. Dr. [name]  . removal of left hematoma Left    leg  .  REVERSE SHOULDER ARTHROPLASTY Right 10/08/2015   Procedure: REVERSE SHOULDER ARTHROPLASTY;  Surgeon: [name], MD;  Location: [facility];  Service: Orthopedics;  Laterality: Right;  . SLEEVE GASTROPLASTY    . TONSILLECTOMY    . TOTAL KNEE ARTHROPLASTY Left   . TOTAL SHOULDER REPLACEMENT Right   . TRANSESOPHAGEAL ECHOCARDIOGRAM  08/08/2013   Mild LVH, EF 60-65%. Moderate LA dilation and mild RA dilation. Mild MR with no evidence of stenosis and no evidence of endocarditis. A false chordae is noted.  . TRANSTHORACIC ECHOCARDIOGRAM  08/03/2013   Mild-Moderate concentric LVH, EF 60-65%. Normal diastolic function. Mild LA dilation. Mild MR with possible vegetation  - not confirmed on TEE   . ULNAR NERVE TRANSPOSITION Right 08/06/2016   Procedure: ULNAR NERVE DECOMPRESSION/TRANSPOSITION;  Surgeon: Milagros Evener  J, MD;  Location: [facility]S;  Service: Orthopedics;  Laterality: Right;  . UPPER GI ENDOSCOPY    . VAGINAL HYSTERECTOMY      Family History  Problem Relation Age of Onset  . Skin cancer Father   . Diabetes Father   . Hypertension Father   . Peripheral vascular disease Father   . Cancer Father   . Cerebral aneurysm Father   . Alcohol abuse Father   . Varicose Veins Mother   . Kidney disease Mother   . Arthritis Mother   . Mental illness Sister   . Cancer Maternal Aunt        breast  . Breast cancer Maternal Aunt   . Arthritis Maternal Grandmother   . Hypertension Maternal Grandmother   . Diabetes Maternal Grandmother   . Arthritis Maternal Grandfather   . Heart disease Maternal Grandfather   . Hypertension Maternal Grandfather   . Arthritis Paternal Grandmother   . Hypertension Paternal Grandmother   . Diabetes Paternal Grandmother   . Arthritis Paternal Grandfather   . Hypertension Paternal Grandfather   . Bladder Cancer Neg Hx   . Kidney cancer Neg Hx     SOCIAL HX:  She is currently married -- for 30+ years. Does not work. Does not smoke or take alcohol. She never  smoked. She exercises at least 3 days a week since before her gastric surgery. Marital status reviewed in history of present illness. 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  albuterol (PROVENTIL HFA;VENTOLIN HFA) 108 (90 Base) MCG/ACT inhaler, Inhale 1-2 puffs into the lungs every 4 (four) hours as needed for wheezing or shortness of breath. (Patient taking differently: Inhale 2 puffs into the lungs every 4 (four) hours as needed for wheezing or shortness of breath. ), Disp: 8 g, Rfl: 2 .  albuterol (PROVENTIL) (2.5 MG/3ML) 0.083% nebulizer solution, Take 3 mLs (2.5 mg total) by nebulization every 6 (six) hours as needed for wheezing or shortness of breath., Disp: 150 mL, Rfl: 1 .  ALPRAZolam (XANAX) 0.5 MG tablet, Take 1 tablet (0.5 mg total) by mouth 2 (two) times daily as needed for anxiety., Disp: 60 tablet, Rfl: 2 .  amLODipine (NORVASC) 5 MG tablet, Take 1 tablet (5 mg total) by mouth daily., Disp: 90 tablet, Rfl: 3 .  buPROPion (WELLBUTRIN XL) 150 MG 24 hr tablet, Take 1 tablet (150 mg total) by mouth daily., Disp: 90 tablet, Rfl: 3 .  clobetasol cream (TEMOVATE) [id number] %, Apply 1 application topically 2 (two) times daily. To arms (Patient taking differently: Apply 1 application topically 2 (two) times daily as needed (irritation). To arms), Disp: 60 g, Rfl: 1 .  dabigatran (PRADAXA) 150 MG CAPS capsule, Take 1 capsule (150 mg total) by mouth 2 (two) times daily., Disp: 180 capsule, Rfl: 1 .  dexlansoprazole (DEXILANT) 60 MG capsule, Take 60 mg by mouth daily before breakfast., Disp: , Rfl:  .  DULoxetine (CYMBALTA) 60 MG capsule, Take 1 capsule (60 mg total) by mouth daily., Disp: 90 capsule, Rfl: 0 .  estradiol (ESTRACE) 0.1 MG/GM vaginal cream, Place 1 Applicatorful vaginally every Monday, Wednesday, and Friday. Apply 0.5mg  (pea-sized amount)  just inside the vaginal introitus with a finger-tip on  Monday, Wednesday and Friday nights,, Disp: 42.5 g, Rfl: 11 .  fesoterodine (TOVIAZ) 8 MG TB24 tablet, Take 1 tablet (8 mg total) by mouth daily., Disp:  90 tablet, Rfl: 3 .  fluticasone-salmeterol (ADVAIR HFA) 115-21 MCG/ACT inhaler, Inhale 2 puffs into the lungs daily. Rinse mouth thoroughly after use, Disp: 1 Inhaler, Rfl: 12 .  hydrochlorothiazide (HYDRODIURIL) 12.5 MG tablet, Take 1 tablet (12.5 mg total) by mouth daily. In am, Disp: 90 tablet, Rfl: 3 .  HYDROcodone-acetaminophen (NORCO/VICODIN) 5-325 MG tablet, Take 1 tablet by mouth every 8 (eight) hours as needed for severe pain. Must last 30 days, Disp: 90 tablet, Rfl: 0 .  levocetirizine (XYZAL) 5 MG tablet, Take 1 tablet (5 mg total) by mouth every evening., Disp: 90 tablet, Rfl: 3 .  levothyroxine (SYNTHROID, LEVOTHROID) 175 MCG tablet, Take 1 tablet (175 mcg total) by mouth daily before breakfast., Disp: 90 tablet, Rfl: 3 .  losartan (COZAAR) 50 MG tablet, Take 1 tablet (50 mg total) by mouth daily. Do not take quinapril 40, Disp: 90 tablet, Rfl: 3 .  meloxicam (MOBIC) 15 MG tablet, Take 1 tablet (15 mg total) by mouth daily as needed for pain., Disp: 90 tablet, Rfl: 0 .  metFORMIN (GLUCOPHAGE) 1000 MG tablet, Take 1 tablet (1,000 mg total) by mouth 2 (two) times daily. With food, Disp: 180 tablet, Rfl: 3 .  metoprolol succinate (TOPROL-XL) 50 MG 24 hr tablet, Take 1 tablet (50 mg total) by mouth daily. PLEASE CALL [phone] TO SCHEDULE APPOINTMENT FOR FURTHER REFILLS., Disp: 30 tablet, Rfl: 0 .  montelukast (SINGULAIR) 10 MG tablet, Take 1 tablet (10 mg total) by mouth at bedtime., Disp: 90 tablet, Rfl: 3 .  mupirocin ointment (BACTROBAN) 2 %, Apply 1 application topically 2 (two) times daily. Skin wounds (Patient taking differently: Apply 1 application topically 2 (two) times daily as needed (skin wounds). ), Disp: 30 g, Rfl: 0 .  nystatin cream (MYCOSTATIN), Apply 1 application topically 2 (two) times daily. Mouth prn for 7 days as  needed (Patient taking differently: Apply 1 application topically 2 (two) times daily as needed for dry skin. Mouth prn for 7 days as needed), Disp: 30 g, Rfl: 11 .  ondansetron (ZOFRAN) 4 MG tablet, Take 1 tablet (4 mg total) by mouth every 8 (eight) hours as needed for nausea or vomiting., Disp: 90 tablet, Rfl: 1 .  oxybutynin (DITROPAN XL) 15 MG 24 hr tablet, TAKE ONE TABLET BY MOUTH AT BEDTIME (Patient taking differently: Take 15 mg by mouth at bedtime. ), Disp: 90 tablet, Rfl: 3 .  oxyCODONE (OXY IR/ROXICODONE) 5 MG immediate release tablet, oxycodone 5 mg tablet, Disp: , Rfl:  .  phenazopyridine (PYRIDIUM) 200 MG tablet, Take 1 tablet (200 mg total) by mouth 3 (three) times daily as needed for pain., Disp: 20 tablet, Rfl: 0 .  pregabalin (LYRICA) 50 MG capsule, Take 1 capsule (50 mg total) by mouth 3 (three) times daily., Disp: 90 capsule, Rfl: 2 .  sucralfate (CARAFATE) 1 GM/10ML suspension, Take 1 g by mouth 2 (two) times daily before a meal., Disp: , Rfl:  .  zinc oxide 20 % ointment, Apply 1 application topically as needed for irritation., Disp: 56.7 g, Rfl: 0  Current Facility-Administered Medications:  .  ipratropium-albuterol (DUONEB) 0.5-2.5 (3) MG/3ML nebulizer solution 3 mL, 3 mL, Nebulization, Q6H, [name], [name], MD  EXAM:  VITALS per patient if applicable:  GENERAL: alert, oriented, appears well and in no acute distress  PSYCH/NEURO: pleasant and cooperative, no obvious depression or anxiety, speech and thought processing grossly intact  ASSESSMENT AND PLAN:  Discussed the following assessment and plan:  Essential hypertension - Plan: Comprehensive metabolic  panel, CBC with Differential/Platelet, Lipid panel Cont meds  Nausea and vomiting, intractability of vomiting not specified, unspecified vomiting type - Plan: ondansetron (ZOFRAN) 4 MG tablet rec f/u [facility] if not better on dexilant 60 and carafate bid   Arthritis - Plan: meloxicam (MOBIC) 15 MG tablet qd  prn  Anxiety and depression -cont meds  -consider osman or other therapy will call back   Ulcer of left foot with fat layer exposed ([place]) F/u plastics healing per pt  Chronic pain of right ankle 11 am appt with Dr. [name] today consider MRI right ankle   Hypothyroidism, adult -check tsh  Cont levo 175 mcg qd labs fasting upcoming   Type 2 diabetes mellitus without complication, without long-term current use of insulin ([place]) - Plan: HgB A1c, Microalbumin / creatinine urine ratio A1C at goal  Cont meds  A1C 6.6 07/29/2018   HM Flu shot 02/02/18 utdpna 23, prevnar Tdaputd considershingrix in future Given Rx twinrixin pastto get at pharmacy h/o fatty liver  DEXA 10/17/14 normal  Out of age windowpapOb/gyn  mammo neg5/20/19 negative -referred previously    [facility] with diverticulosis and polypsmultiple tubular and hyperplastic due in 2023   EGD had 10/25/2018 repeat due in 1 year [facility] Dr. Gustavo Lah  -we discussed possible serious and likely etiologies, options for evaluation and workup, limitations of telemedicine visit vs in person visit, treatment, treatment risks and precautions. Pt prefers to treat via telemedicine empirically rather then risking or undertaking an in person visit at this moment. Patient agrees to seek prompt in person care if worsening, new symptoms arise, or if is not improving with treatment.   I discussed the assessment and treatment plan with the patient. The patient was provided an opportunity to ask questions and all were answered. The patient agreed with the plan and demonstrated an understanding of the instructions.   The patient was advised to call back or seek an in-person evaluation if the symptoms worsen or if the condition fails to improve as anticipated.  Time spent 20 minutes  Delorise Jackson, MD

## 2019-02-22 DIAGNOSIS — E1143 Type 2 diabetes mellitus with diabetic autonomic (poly)neuropathy: Secondary | ICD-10-CM | POA: Diagnosis not present

## 2019-02-22 DIAGNOSIS — L97521 Non-pressure chronic ulcer of other part of left foot limited to breakdown of skin: Secondary | ICD-10-CM | POA: Diagnosis not present

## 2019-02-23 ENCOUNTER — Encounter: Payer: Self-pay | Admitting: Pain Medicine

## 2019-02-27 ENCOUNTER — Other Ambulatory Visit: Payer: Self-pay

## 2019-02-27 ENCOUNTER — Ambulatory Visit: Payer: PPO | Attending: Pain Medicine | Admitting: Pain Medicine

## 2019-02-27 ENCOUNTER — Ambulatory Visit: Payer: PPO | Admitting: Urology

## 2019-02-27 ENCOUNTER — Encounter: Payer: Self-pay | Admitting: Urology

## 2019-02-27 DIAGNOSIS — M7918 Myalgia, other site: Secondary | ICD-10-CM | POA: Diagnosis not present

## 2019-02-27 DIAGNOSIS — M8949 Other hypertrophic osteoarthropathy, multiple sites: Secondary | ICD-10-CM

## 2019-02-27 DIAGNOSIS — E1142 Type 2 diabetes mellitus with diabetic polyneuropathy: Secondary | ICD-10-CM | POA: Diagnosis not present

## 2019-02-27 DIAGNOSIS — G5711 Meralgia paresthetica, right lower limb: Secondary | ICD-10-CM | POA: Diagnosis not present

## 2019-02-27 DIAGNOSIS — G894 Chronic pain syndrome: Secondary | ICD-10-CM | POA: Diagnosis not present

## 2019-02-27 DIAGNOSIS — G8929 Other chronic pain: Secondary | ICD-10-CM

## 2019-02-27 DIAGNOSIS — M159 Polyosteoarthritis, unspecified: Secondary | ICD-10-CM

## 2019-02-27 DIAGNOSIS — M797 Fibromyalgia: Secondary | ICD-10-CM

## 2019-02-27 MED ORDER — HYDROCODONE-ACETAMINOPHEN 5-325 MG PO TABS: 1.0000 | ORAL_TABLET | Freq: Three times a day (TID) | ORAL | 0 refills | Status: DC | PRN

## 2019-02-27 MED ORDER — PREGABALIN 50 MG PO CAPS: 50.0000 mg | ORAL_CAPSULE | Freq: Three times a day (TID) | ORAL | 5 refills | Status: DC

## 2019-02-27 MED ORDER — DULOXETINE HCL 60 MG PO CPEP: 60.0000 mg | ORAL_CAPSULE | Freq: Every day | ORAL | 5 refills | Status: DC

## 2019-02-27 NOTE — Progress Notes (Signed)
Pain Management Virtual Encounter Note - Virtual Visit via Telephone Telehealth (real-time audio visits between healthcare provider and patient).   Patient's Phone No. & Preferred Pharmacy:  [phone] (home); [phone] (mobile); (Preferred) [phone] [email or web address]  [place], [place] [address] Phone: [phone] Fax: [phone]    Pre-screening note:  Our staff contacted [name] and offered her an "in person", "face-to-face" appointment versus a telephone encounter. She indicated preferring the telephone encounter, at this time.   Reason for Virtual Visit: COVID-19*  Social distancing based on CDC and AMA recommendations.   I contacted [name] on 02/27/2019 via telephone.      I clearly identified myself as Gaspar Cola, MD. I verified that I was speaking with the correct person using two identifiers (Name: [name], and date of birth: September 21, 1950).  Advanced Informed Consent I sought verbal advanced consent from [name] for virtual visit interactions. I informed [name] of possible security and privacy concerns, risks, and limitations associated with providing "not-in-person" medical evaluation and management services. I also informed [name] of the availability of "in-person" appointments. Finally, I informed her that there would be a charge for the virtual visit and that she could be  personally, fully or partially, financially responsible for it. [name] expressed understanding and agreed to proceed.   Historic Elements   [name] is a 68 y.o. year old, female patient evaluated today after her last encounter by our practice on 12/11/2018. [name]  has a past medical history of Abnormal antibody titer, Anxiety and depression, Arthritis, Asthma, Cystocele, Depression, Diabetes ([place]), DVT (deep venous thrombosis) ([place]), Dyspnea, Dysrhythmia, Edema,  GERD (gastroesophageal reflux disease), Heart murmur, History of IBS, History of kidney stones, History of methicillin resistant staphylococcus aureus (MRSA) (2008), Hyperlipidemia, Hypertension, Hypothyroidism, Insomnia, Kidney stone, Neuropathy involving both lower extremities, Obesity, Class II, BMI 35-39.9, with comorbidity, Paroxysmal atrial fibrillation ([place]) (2007), Peripheral vascular disease ([place]), Sleep apnea, Urinary incontinence, and Venous stasis dermatitis of both lower extremities. She also  has a past surgical history that includes Ankle surgery (Right); Vaginal hysterectomy; Sleeve Gastroplasty; Lithotripsy; Kidney surgery (Right); transthoracic echocardiogram (08/03/2013); transesophageal echocardiogram (08/08/2013); Tonsillectomy; Total knee arthroplasty (Left); Hiatal hernia repair; Cholecystectomy; I&D extremity (Left, 06/27/2015); Application if wound vac (Left, 06/27/2015); removal of left hematoma (Left); NM GATED MYOVIEW [facility] HX) (February 2017); Reverse shoulder arthroplasty (Right, 10/08/2015); Hernia repair; Ulnar nerve transposition (Right, 08/06/2016); arm surgery; Irrigation and debridement abscess (Left, 09/07/2016); Incision and drainage of wound (Left, 09/25/2016); Application if wound vac (Left, 09/25/2016); Colonoscopy; Upper gi endoscopy; Esophagogastroduodenoscopy (egd) with propofol (N/A, 01/11/2017); Colonoscopy with propofol (N/A, 01/11/2017); Colonoscopy with propofol (N/A, 02/22/2017); OTHER SURGICAL HISTORY; Total shoulder replacement (Right); Joint replacement (2013); Colonoscopy with propofol (N/A, 05/31/2017); Lower Extremity Angiography (Left, 04/04/2018); Esophagogastroduodenoscopy (egd) with propofol (N/A, 10/25/2018); and Incision and drainage of wound (Left, 11/09/2018). [name] has a current medication list which includes the following prescription(s): albuterol, albuterol, alprazolam, amlodipine, bupropion, clobetasol cream, dabigatran, dexilant, duloxetine, estradiol,  fesoterodine, fluticasone-salmeterol, hydrochlorothiazide, hydrocodone-acetaminophen, hydrocodone-acetaminophen, hydrocodone-acetaminophen, levocetirizine, levothyroxine, losartan, meloxicam, metformin, metoprolol succinate, montelukast, mupirocin ointment, nystatin cream, ondansetron, oxybutynin, phenazopyridine, pregabalin, sucralfate, and zinc oxide, and the following Facility-Administered Medications: ipratropium-albuterol. She  reports that she has never smoked. She has never used smokeless tobacco. She reports that she does not drink alcohol or use drugs. [name] is allergic to morphine and related; penicillins; aspirin; and oxytetracycline.   HPI  Today, she is being contacted for medication management.  The patient indicates doing well with the current medication regimen. No adverse reactions or side effects reported to the medications.   Pharmacotherapy Assessment  Analgesic: Hydrocodone/APAP 5/325 mg, 1 tab PO q 8 hours (15 mg/day of hydrocodone) MME/day:15 mg/day.   Monitoring: Pharmacotherapy: No side-effects or adverse reactions reported. [place] PMP: PDMP reviewed during this encounter.       Compliance: No problems identified. Effectiveness: Clinically acceptable. Plan: Refer to "POC".  UDS:  Summary  Date Value Ref Range Status  05/19/2018 FINAL  Final    Comment:    ==================================================================== TOXASSURE SELECT 13 (MW) ==================================================================== Test                             Result       Flag       Units Drug Present and Declared for Prescription Verification   Alprazolam                     111          EXPECTED   ng/mg creat   Alpha-hydroxyalprazolam        162          EXPECTED   ng/mg creat    Source of alprazolam is a scheduled prescription medication.    Alpha-hydroxyalprazolam is an expected metabolite of alprazolam.   Hydrocodone                    560          EXPECTED   ng/mg  creat   Hydromorphone                  30           EXPECTED   ng/mg creat   Dihydrocodeine                 86           EXPECTED   ng/mg creat   Norhydrocodone                 943          EXPECTED   ng/mg creat    Sources of hydrocodone include scheduled prescription    medications. Hydromorphone, dihydrocodeine and norhydrocodone are    expected metabolites of hydrocodone. Hydromorphone and    dihydrocodeine are also available as scheduled prescription    medications. ==================================================================== Test                      Result    Flag   Units      Ref Range   Creatinine              206              mg/dL      >=20 ==================================================================== Declared Medications:  The flagging and interpretation on this report are based on the  following declared medications.  Unexpected results may arise from  inaccuracies in the declared medications.  **Note: The testing scope of this panel includes these medications:  Alprazolam  Hydrocodone (Hydrocodone-Acetaminophen)  **Note: The testing scope of this panel does not include following  reported medications:  Acetaminophen (Hydrocodone-Acetaminophen)  Albuterol  Amlodipine Besylate  Bupropion  Calcium citrate (Calcium citrate/Vitamin D)  Clobetasol  Dabigatran  Dexlansoprazole  Duloxetine  Estradiol  Fesoterodine  Fluticasone (Advair)  Gabapentin  Hydrochlorothiazide  Levocetirizine  Levothyroxine  Losartan (Losartan Potassium)  Metformin  Metoprolol  Montelukast  Mupirocin  Nystatin  Ondansetron  Oxybutynin  Pregabalin  Salmeterol (Advair)  Tiotropium  Topical Diclofenac  Vitamin D (Calcium citrate/Vitamin D)  Zinc Oxide ==================================================================== For clinical consultation, please call [phone]. ====================================================================    Laboratory Chemistry  Profile (12 mo)  Renal: 11/09/2018: BUN 21; Creatinine, Ser 1.22  Lab Results  Component Value Date   GFR 49.29 (L) 05/12/2018   GFRAA 52 (L) 11/09/2018   GFRNONAA 45 (L) 11/09/2018   Hepatic: 09/28/2018: Albumin 3.7 Lab Results  Component Value Date   AST 18 09/28/2018   ALT 13 09/28/2018   Other: No results found for requested labs within last 8760 hours. Note: Above Lab results reviewed.  Imaging  US Venous Img Lower Unilateral Left CLINICAL DATA:  68 year old with left leg swelling and pain. History of DVT.  EXAM: LEFT LOWER EXTREMITY VENOUS DOPPLER ULTRASOUND  TECHNIQUE: Gray-scale sonography with graded compression, as well as color Doppler and duplex ultrasound were performed to evaluate the lower extremity deep venous systems from the level of the common femoral vein and including the common femoral, femoral, profunda femoral, popliteal and calf veins including the posterior tibial, peroneal and gastrocnemius veins when visible. The superficial great saphenous vein was also interrogated. Spectral Doppler was utilized to evaluate flow at rest and with distal augmentation maneuvers in the common femoral, femoral and popliteal veins.  COMPARISON:  10/01/2016  FINDINGS: Contralateral Common Femoral Vein: Respiratory phasicity is normal and symmetric with the symptomatic side. No evidence of thrombus. Normal compressibility.  Common Femoral Vein: No evidence of thrombus. Normal compressibility, respiratory phasicity and response to augmentation.  Saphenofemoral Junction: No evidence of thrombus. Normal compressibility and flow on color Doppler imaging.  Profunda Femoral Vein: No evidence of thrombus. Normal compressibility and flow on color Doppler imaging.  Femoral Vein: No evidence of thrombus. Normal compressibility, respiratory phasicity and response to augmentation.  Popliteal Vein: No evidence of thrombus. Normal compressibility, respiratory phasicity and  response to augmentation.  Calf Veins: Limited evaluation of the deep calf veins due to body habitus and edema.  Other Findings:  None.  IMPRESSION: Negative for deep venous thrombosis in left lower extremity.  Limited evaluation of the left deep calf veins.  Electronically Signed   By: Markus Daft M.D.   On: 09/30/2018 16:24   Assessment  The primary encounter diagnosis was Chronic pain syndrome. Diagnoses of Meralgia paresthetica of right side, Chronic musculoskeletal pain, Fibromyalgia, Osteoarthritis involving multiple joints, and Diabetic peripheral neuropathy associated with type 2 diabetes mellitus ([place]) were also pertinent to this visit.  Plan of Care  Problem-specific:  No problem-specific Assessment & Plan notes found for this encounter.  I have discontinued Izora Gala B. Kerce's oxyCODONE. I am also having her start on HYDROcodone-acetaminophen and HYDROcodone-acetaminophen. Additionally, I am having her maintain her albuterol, albuterol, zinc oxide, mupirocin ointment, nystatin cream, clobetasol cream, amLODipine, dabigatran, levothyroxine, fesoterodine, fluticasone-salmeterol, buPROPion, levocetirizine, oxybutynin, phenazopyridine, Dexilant, sucralfate, metoprolol succinate, ALPRAZolam, losartan, metFORMIN, estradiol, montelukast, hydrochlorothiazide, ondansetron, meloxicam, DULoxetine, pregabalin, and HYDROcodone-acetaminophen. We will continue to administer ipratropium-albuterol.  Pharmacotherapy (Medications Ordered): Meds ordered this encounter  Medications  . DULoxetine (CYMBALTA) 60 MG capsule    Sig: Take 1 capsule (60 mg total) by mouth daily.    Dispense:  30 capsule    Refill:  5    Fill one day early if pharmacy is closed on scheduled refill date. May substitute for generic if  available.  . pregabalin (LYRICA) 50 MG capsule    Sig: Take 1 capsule (50 mg total) by mouth 3 (three) times daily.    Dispense:  90 capsule    Refill:  5    Fill one day early if  pharmacy is closed on scheduled refill date. May substitute for generic if available.  Marland Kitchen HYDROcodone-acetaminophen (NORCO/VICODIN) 5-325 MG tablet    Sig: Take 1 tablet by mouth every 8 (eight) hours as needed for severe pain. Must last 30 days    Dispense:  90 tablet    Refill:  0    Chronic Pain: STOP Act (Not applicable) Fill 1 day early if closed on refill date. Do not fill until: 03/05/2019. To last until: 04/04/2019. Avoid benzodiazepines within 8 hours of opioids  . HYDROcodone-acetaminophen (NORCO/VICODIN) 5-325 MG tablet    Sig: Take 1 tablet by mouth every 8 (eight) hours as needed for severe pain. Must last 30 days    Dispense:  90 tablet    Refill:  0    Chronic Pain: STOP Act (Not applicable) Fill 1 day early if closed on refill date. Do not fill until: 04/04/2019. To last until: 05/04/2019. Avoid benzodiazepines within 8 hours of opioids  . HYDROcodone-acetaminophen (NORCO/VICODIN) 5-325 MG tablet    Sig: Take 1 tablet by mouth every 8 (eight) hours as needed for severe pain. Must last 30 days    Dispense:  90 tablet    Refill:  0    Chronic Pain: STOP Act (Not applicable) Fill 1 day early if closed on refill date. Do not fill until: 05/04/2019. To last until: 06/03/2019. Avoid benzodiazepines within 8 hours of opioids   Orders:  No orders of the defined types were placed in this encounter.  Follow-up plan:   Return in about 3 months (around 05/31/2019) for (VV), (MM).      Interventional therapies: Planned, scheduled, and/or pending:   Not at this time. (Always stop Pradaxa x 5 days prior to any blocks.)   Considering:   NOTE: PRADAXA ANTICOAGULATION (Stop: 5 days  Restart: 6 hours) NO LUMBAR RFA until BMI < 35. Diagnostic right suprascapular NB Diagnostic right IA knee injection  Possible series of 5 Hyalgan knee injections Diagnostic right-sided genicular NB Possible right-sided genicular nerve RFA  Diagnostic right-sided suprascapular NB  Possible right-sided  suprascapular nerve RFA    Palliative PRN treatment(s):   Palliative right IA shoulder joint injection  Palliative right lateral femoral cutaneous NB Palliative right lateral femoral continuous nerve RFA #2      Recent Visits Date Type Provider Dept  12/05/18 Office Visit Milinda Pointer, MD [facility]  Showing recent visits within past 90 days and meeting all other requirements   Today's Visits Date Type Provider Dept  02/27/19 Telemedicine Milinda Pointer, MD [facility]  Showing today's visits and meeting all other requirements   Future Appointments No visits were found meeting these conditions.  Showing future appointments within next 90 days and meeting all other requirements   I discussed the assessment and treatment plan with the patient. The patient was provided an opportunity to ask questions and all were answered. The patient agreed with the plan and demonstrated an understanding of the instructions.  Patient advised to call back or seek an in-person evaluation if the symptoms or condition worsens.  Total duration of non-face-to-face encounter: 13 minutes.  Note by: Gaspar Cola, MD Date: 02/27/2019; Time: 9:46 AM  Note: This dictation was prepared with Viviann Spare  dictation. Any transcriptional errors that may result from this process are unintentional.  Disclaimer:  * Given the special circumstances of the COVID-19 pandemic, the federal government has announced that the Office for Civil Rights (OCR) will exercise its enforcement discretion and will not impose penalties on physicians using telehealth in the event of noncompliance with regulatory requirements under the [place] (HIPAA) in connection with the good faith provision of telehealth during the XX123456 national public health emergency. ([place])

## 2019-03-07 ENCOUNTER — Ambulatory Visit: Payer: PPO | Admitting: Plastic Surgery

## 2019-03-07 DIAGNOSIS — L97521 Non-pressure chronic ulcer of other part of left foot limited to breakdown of skin: Secondary | ICD-10-CM | POA: Diagnosis not present

## 2019-03-07 DIAGNOSIS — E1143 Type 2 diabetes mellitus with diabetic autonomic (poly)neuropathy: Secondary | ICD-10-CM | POA: Diagnosis not present

## 2019-03-10 ENCOUNTER — Other Ambulatory Visit: Payer: Self-pay | Admitting: Internal Medicine

## 2019-03-10 DIAGNOSIS — E039 Hypothyroidism, unspecified: Secondary | ICD-10-CM

## 2019-03-10 MED ORDER — LEVOTHYROXINE SODIUM 175 MCG PO TABS: 175.0000 ug | ORAL_TABLET | Freq: Every day | ORAL | 3 refills | Status: DC

## 2019-03-15 DIAGNOSIS — L97521 Non-pressure chronic ulcer of other part of left foot limited to breakdown of skin: Secondary | ICD-10-CM | POA: Diagnosis not present

## 2019-03-15 DIAGNOSIS — M25571 Pain in right ankle and joints of right foot: Secondary | ICD-10-CM | POA: Diagnosis not present

## 2019-03-15 DIAGNOSIS — E1159 Type 2 diabetes mellitus with other circulatory complications: Secondary | ICD-10-CM | POA: Diagnosis not present

## 2019-03-29 DIAGNOSIS — E1159 Type 2 diabetes mellitus with other circulatory complications: Secondary | ICD-10-CM | POA: Diagnosis not present

## 2019-03-29 DIAGNOSIS — L97521 Non-pressure chronic ulcer of other part of left foot limited to breakdown of skin: Secondary | ICD-10-CM | POA: Diagnosis not present

## 2019-03-29 DIAGNOSIS — E1143 Type 2 diabetes mellitus with diabetic autonomic (poly)neuropathy: Secondary | ICD-10-CM | POA: Diagnosis not present

## 2019-03-30 DIAGNOSIS — Z7984 Long term (current) use of oral hypoglycemic drugs: Secondary | ICD-10-CM | POA: Diagnosis not present

## 2019-03-30 DIAGNOSIS — E119 Type 2 diabetes mellitus without complications: Secondary | ICD-10-CM | POA: Diagnosis not present

## 2019-03-30 DIAGNOSIS — H2513 Age-related nuclear cataract, bilateral: Secondary | ICD-10-CM | POA: Diagnosis not present

## 2019-03-30 DIAGNOSIS — H52223 Regular astigmatism, bilateral: Secondary | ICD-10-CM | POA: Diagnosis not present

## 2019-03-30 DIAGNOSIS — H524 Presbyopia: Secondary | ICD-10-CM | POA: Diagnosis not present

## 2019-04-03 LAB — HM DIABETES EYE EXAM

## 2019-04-07 ENCOUNTER — Other Ambulatory Visit: Payer: Self-pay | Admitting: Cardiovascular Disease

## 2019-04-07 DIAGNOSIS — I4891 Unspecified atrial fibrillation: Secondary | ICD-10-CM

## 2019-04-12 DIAGNOSIS — L97521 Non-pressure chronic ulcer of other part of left foot limited to breakdown of skin: Secondary | ICD-10-CM | POA: Diagnosis not present

## 2019-04-12 DIAGNOSIS — E1159 Type 2 diabetes mellitus with other circulatory complications: Secondary | ICD-10-CM | POA: Diagnosis not present

## 2019-04-14 ENCOUNTER — Other Ambulatory Visit: Payer: Self-pay | Admitting: Internal Medicine

## 2019-04-14 DIAGNOSIS — F419 Anxiety disorder, unspecified: Secondary | ICD-10-CM

## 2019-04-14 MED ORDER — ALPRAZOLAM 0.5 MG PO TABS: 0.5000 mg | ORAL_TABLET | Freq: Two times a day (BID) | ORAL | 2 refills | Status: DC | PRN

## 2019-04-17 ENCOUNTER — Encounter: Payer: Self-pay | Admitting: Internal Medicine

## 2019-04-26 DIAGNOSIS — L97521 Non-pressure chronic ulcer of other part of left foot limited to breakdown of skin: Secondary | ICD-10-CM | POA: Diagnosis not present

## 2019-04-26 DIAGNOSIS — E1159 Type 2 diabetes mellitus with other circulatory complications: Secondary | ICD-10-CM | POA: Diagnosis not present

## 2019-05-02 ENCOUNTER — Other Ambulatory Visit: Payer: Self-pay | Admitting: Internal Medicine

## 2019-05-02 ENCOUNTER — Telehealth: Payer: Self-pay | Admitting: Internal Medicine

## 2019-05-02 DIAGNOSIS — F419 Anxiety disorder, unspecified: Secondary | ICD-10-CM

## 2019-05-02 DIAGNOSIS — F329 Major depressive disorder, single episode, unspecified: Secondary | ICD-10-CM

## 2019-05-02 DIAGNOSIS — F32A Depression, unspecified: Secondary | ICD-10-CM

## 2019-05-02 MED ORDER — BUPROPION HCL ER (XL) 150 MG PO TB24: 150.0000 mg | ORAL_TABLET | Freq: Every day | ORAL | 3 refills | Status: DC

## 2019-05-02 NOTE — Telephone Encounter (Signed)
Pt is coughing with some shortness of breath. She has a sore throat and she thinks it is bronchitis. She wanted an appt with Dr. Olivia Mackie but by the fact she said shortness of breath I sent her to access nurse.   Curt Bears access nurse took call

## 2019-05-03 DIAGNOSIS — E1159 Type 2 diabetes mellitus with other circulatory complications: Secondary | ICD-10-CM | POA: Diagnosis not present

## 2019-05-03 DIAGNOSIS — L97521 Non-pressure chronic ulcer of other part of left foot limited to breakdown of skin: Secondary | ICD-10-CM | POA: Diagnosis not present

## 2019-05-03 NOTE — Telephone Encounter (Signed)
See Below note from [place] patient was advised ED called to see if patient was evaluated called left message to call office.

## 2019-05-03 NOTE — Telephone Encounter (Signed)
[place] [id number] RN Call RINKI, JURKOWSKI 05/02/2019 17:04:10 Ermalene Postin,  RN BREATHING - shortness of breath or sounds breathless The pt. has coughing, sore throat, and SOB. Asthma Attack :Caller reports she hasnt used her inhaler for the last few weeks. Go to ED Now

## 2019-05-04 NOTE — Telephone Encounter (Signed)
Patient is some better today 02 sats 95% on room air still wheezing using rescue inhaler as prescribed no fever , coughing, shortness of breath better today but still  feels SOB, especially after coughing. I have scheduled Telephone visit and advised ER if has any worsening symptoms patient agreed . Appointment with PCP for 05/05/19 at 2 PM.

## 2019-05-04 NOTE — Telephone Encounter (Signed)
Lm for pt to return our call.

## 2019-05-04 NOTE — Telephone Encounter (Signed)
Former patient of Dr. Alva Garnet, please schedule with either me or Dr. Mortimer Fries next available please

## 2019-05-04 NOTE — Telephone Encounter (Signed)
Pt has persistent asthma uncontrolled needs to see pulmonary  Can you all work her in was seeing Dr. Alva Garnet ?  thanks  [place]

## 2019-05-05 ENCOUNTER — Encounter: Payer: Self-pay | Admitting: Internal Medicine

## 2019-05-05 ENCOUNTER — Other Ambulatory Visit: Payer: Self-pay | Admitting: Cardiovascular Disease

## 2019-05-05 ENCOUNTER — Other Ambulatory Visit: Payer: Self-pay

## 2019-05-05 ENCOUNTER — Ambulatory Visit: Payer: PPO | Admitting: Internal Medicine

## 2019-05-05 VITALS — BP 122/72 | Ht 65.5 in | Wt 254.0 lb

## 2019-05-05 NOTE — Telephone Encounter (Signed)
Pt has been scheduled for OV on 05/09/2019 at 3:00. Nothing further is needed.

## 2019-05-09 ENCOUNTER — Ambulatory Visit: Payer: PPO | Admitting: Pulmonary Disease

## 2019-05-09 ENCOUNTER — Other Ambulatory Visit: Payer: Self-pay

## 2019-05-09 ENCOUNTER — Encounter: Payer: Self-pay | Admitting: Pulmonary Disease

## 2019-05-09 VITALS — BP 134/82 | HR 67 | Temp 97.3°F | Ht 65.5 in | Wt 266.6 lb

## 2019-05-09 DIAGNOSIS — R1319 Other dysphagia: Secondary | ICD-10-CM

## 2019-05-09 DIAGNOSIS — I340 Nonrheumatic mitral (valve) insufficiency: Secondary | ICD-10-CM

## 2019-05-09 DIAGNOSIS — R1310 Dysphagia, unspecified: Secondary | ICD-10-CM

## 2019-05-09 DIAGNOSIS — J4540 Moderate persistent asthma, uncomplicated: Secondary | ICD-10-CM

## 2019-05-09 DIAGNOSIS — R059 Cough, unspecified: Secondary | ICD-10-CM

## 2019-05-09 DIAGNOSIS — Z6841 Body Mass Index (BMI) 40.0 and over, adult: Secondary | ICD-10-CM

## 2019-05-09 DIAGNOSIS — K219 Gastro-esophageal reflux disease without esophagitis: Secondary | ICD-10-CM

## 2019-05-09 DIAGNOSIS — R05 Cough: Secondary | ICD-10-CM

## 2019-05-09 MED ORDER — FLUTTER DEVI: 0 refills | Status: DC

## 2019-05-09 NOTE — Progress Notes (Signed)
 Assessment & Plan:  1. Cough (Primary)  2. Esophageal dysphagia  3. Laryngopharyngeal reflux (LPR)  4. Asthma, moderate persistent, well-controlled  5. Mitral valve insufficiency, unspecified etiology  6. Morbid obesity with BMI of 40.0-44.9, adult [facility])   Patient Instructions  Remember to drink plenty of water  during the day to keep your upper airway tissues moist.  We will give you an Acapella flutter valve to work with to loosen up the secretions that collects in the upper portion of the airways.  Start with 5 breaths session then increase to 10 breaths per session as tolerated.  Can do as many sessions during the day as you feel you need.  We may consider evaluation by ear nose and throat.  If you continue to have issues with choking during eating we may need to have you reassessed by GI (gastroenterology).  We will see you in follow-up in 4 to 6 weeks time.  Please note: late entry documentation due to logistical difficulties during COVID-19 pandemic. This note is filed for information purposes only, and is not intended to be used for billing, nor does it represent the full scope/nature of the visit in question. Please see any associated scanned media linked to date of encounter for additional pertinent information.  Subjective:    HPI: [name] is a 69 y.o. adult presenting to the pulmonology clinic on 05/09/2019 with report of: Follow-up (Pt states she has been off and on since last ov. Increased SOB on exertion. Since 01/2019 she has been coughing and productive with green sputum. Wheezing at night. Pt denies any fever or chills)     Outpatient Encounter Medications as of 05/09/2019  Medication Sig   ondansetron  (ZOFRAN ) 4 MG tablet Take 1 tablet (4 mg total) by mouth every 8 (eight) hours as needed for nausea or vomiting.   [DISCONTINUED] albuterol  (PROVENTIL  HFA;VENTOLIN  HFA) 108 (90 Base) MCG/ACT inhaler Inhale 1-2 puffs into the lungs every 4 (four) hours  as needed for wheezing or shortness of breath. (Patient taking differently: Inhale 2 puffs into the lungs every 4 (four) hours as needed for wheezing or shortness of breath. )   [DISCONTINUED] albuterol  (PROVENTIL ) (2.5 MG/3ML) 0.083% nebulizer solution Take 3 mLs (2.5 mg total) by nebulization every 6 (six) hours as needed for wheezing or shortness of breath.   [DISCONTINUED] ALPRAZolam  (XANAX ) 0.5 MG tablet Take 1 tablet (0.5 mg total) by mouth 2 (two) times daily as needed for anxiety.   [DISCONTINUED] amLODipine  (NORVASC ) 5 MG tablet Take 1 tablet (5 mg total) by mouth daily.   [DISCONTINUED] buPROPion  (WELLBUTRIN  XL) 150 MG 24 hr tablet Take 1 tablet (150 mg total) by mouth daily.   [DISCONTINUED] cetirizine  (ZYRTEC ) 10 MG tablet cetirizine  10 mg tablet  TAKE 1 TABLET ONCE A DAY ORALLY   [DISCONTINUED] clobetasol  cream (TEMOVATE ) 0.05 % Apply 1 application topically 2 (two) times daily. To arms (Patient taking differently: Apply 1 application topically 2 (two) times daily as needed (irritation). To arms)   [DISCONTINUED] colchicine  0.6 MG tablet Take by mouth. (Patient not taking: Reported on 10/31/2019)   [DISCONTINUED] dexlansoprazole  (DEXILANT ) 60 MG capsule Take 60 mg by mouth daily before breakfast.   [DISCONTINUED] DULoxetine  (CYMBALTA ) 60 MG capsule Take 1 capsule (60 mg total) by mouth daily.   [DISCONTINUED] ergocalciferol  (VITAMIN D2) 1.25 MG (50000 UT) capsule ergocalciferol  (vitamin D2) 1,250 mcg (50,000 unit) capsule  Take 1 capsule every week by oral route in the morning.   [DISCONTINUED] estradiol  (ESTRACE ) 0.1  MG/GM vaginal cream Place 1 Applicatorful vaginally every Monday, Wednesday, and Friday. Apply 0.5mg  (pea-sized amount)  just inside the vaginal introitus with a finger-tip on Monday, Wednesday and Friday nights,   [DISCONTINUED] fesoterodine  (TOVIAZ ) 8 MG TB24 tablet Take 1 tablet (8 mg total) by mouth daily.   [DISCONTINUED] fluticasone -salmeterol (ADVAIR  HFA) 115-21  MCG/ACT inhaler Inhale 2 puffs into the lungs daily. Rinse mouth thoroughly after use   [DISCONTINUED] hydrochlorothiazide  (HYDRODIURIL ) 12.5 MG tablet Take 1 tablet (12.5 mg total) by mouth daily. In am   [DISCONTINUED] HYDROcodone -acetaminophen  (NORCO/VICODIN) 5-325 MG tablet Take 1 tablet by mouth every 8 (eight) hours as needed for severe pain. Must last 30 days   [DISCONTINUED] levocetirizine (XYZAL ) 5 MG tablet Take 1 tablet (5 mg total) by mouth every evening.   [DISCONTINUED] levothyroxine  (SYNTHROID ) 175 MCG tablet Take 1 tablet (175 mcg total) by mouth daily before breakfast.   [DISCONTINUED] losartan  (COZAAR ) 50 MG tablet Take 1 tablet (50 mg total) by mouth daily. Do not take quinapril  40   [DISCONTINUED] losartan -hydrochlorothiazide  (HYZAAR) 50-12.5 MG tablet Take 1 tablet by mouth daily.   [DISCONTINUED] meloxicam  (MOBIC ) 15 MG tablet Take 1 tablet (15 mg total) by mouth daily as needed for pain.   [DISCONTINUED] metFORMIN  (GLUCOPHAGE ) 1000 MG tablet Take 1 tablet (1,000 mg total) by mouth 2 (two) times daily. With food (Patient taking differently: Take 500-1,000 mg by mouth 2 (two) times daily. Take 1 tablet every morning and 1/2 tablet every night.)   [DISCONTINUED] metoprolol  succinate (TOPROL -XL) 50 MG 24 hr tablet TAKE 1 TABLET BY MOUTH DAILY   [DISCONTINUED] montelukast  (SINGULAIR ) 10 MG tablet Take 1 tablet (10 mg total) by mouth at bedtime.   [DISCONTINUED] mupirocin  ointment (BACTROBAN ) 2 % Apply 1 application topically 2 (two) times daily. Skin wounds   [DISCONTINUED] nystatin  cream (MYCOSTATIN ) Apply 1 application topically 2 (two) times daily. Mouth prn for 7 days as needed (Patient not taking: Reported on 11/29/2019)   [DISCONTINUED] oxybutynin  (DITROPAN  XL) 15 MG 24 hr tablet TAKE ONE TABLET BY MOUTH AT BEDTIME (Patient taking differently: Take 15 mg by mouth at bedtime. )   [DISCONTINUED] pantoprazole  (PROTONIX ) 40 MG tablet Take 40 mg by mouth daily.    [DISCONTINUED]  phenazopyridine  (PYRIDIUM ) 200 MG tablet Take 1 tablet (200 mg total) by mouth 3 (three) times daily as needed for pain.   [DISCONTINUED] PRADAXA  150 MG CAPS capsule TAKE 1 CAPSULE TWICE DAILY   [DISCONTINUED] pregabalin  (LYRICA ) 50 MG capsule Take 1 capsule (50 mg total) by mouth 3 (three) times daily.   [DISCONTINUED] sucralfate  (CARAFATE ) 1 GM/10ML suspension Take 1 g by mouth 2 (two) times daily before a meal.   [DISCONTINUED] zinc  oxide 20 % ointment Apply 1 application topically as needed for irritation.   [DISCONTINUED] HYDROcodone -acetaminophen  (NORCO/VICODIN) 5-325 MG tablet Take 1 tablet by mouth every 8 (eight) hours as needed for severe pain. Must last 30 days   [DISCONTINUED] HYDROcodone -acetaminophen  (NORCO/VICODIN) 5-325 MG tablet Take 1 tablet by mouth every 8 (eight) hours as needed for severe pain. Must last 30 days   [DISCONTINUED] Respiratory Therapy Supplies (FLUTTER) DEVI Use as directed.   [DISCONTINUED] ipratropium-albuterol  (DUONEB) 0.5-2.5 (3) MG/3ML nebulizer solution 3 mL    No facility-administered encounter medications on file as of 05/09/2019.      Objective:   Vitals:   05/09/19 1512  BP: 134/82  Pulse: 67  Temp: (!) 97.3 F (36.3 C)  Height: 5' 5.5 (1.664 m)  Weight: 266 lb 9.6 oz (120.9 kg)  SpO2: 100% Comment: on ra  TempSrc: Temporal  BMI (Calculated): 43.67     Physical exam documentation is limited by delayed entry of information.

## 2019-05-09 NOTE — Patient Instructions (Addendum)
Remember to drink plenty of water during the day to keep your upper airway tissues moist.  We will give you an Acapella flutter valve to work with to loosen up the secretions that collects in the upper portion of the airways.  Start with 5 breaths session then increase to 10 breaths per session as tolerated.  Can do as many sessions during the day as you feel you need.  We may consider evaluation by ear nose and throat.  If you continue to have issues with choking during eating we may need to have you reassessed by GI (gastroenterology).  We will see you in follow-up in 4 to 6 weeks time.

## 2019-05-10 ENCOUNTER — Other Ambulatory Visit: Payer: Self-pay | Admitting: Pain Medicine

## 2019-05-10 DIAGNOSIS — G894 Chronic pain syndrome: Secondary | ICD-10-CM

## 2019-05-12 ENCOUNTER — Other Ambulatory Visit: Payer: PPO

## 2019-05-16 ENCOUNTER — Other Ambulatory Visit: Payer: PPO

## 2019-05-17 DIAGNOSIS — L02611 Cutaneous abscess of right foot: Secondary | ICD-10-CM | POA: Diagnosis not present

## 2019-05-17 DIAGNOSIS — L02619 Cutaneous abscess of unspecified foot: Secondary | ICD-10-CM | POA: Diagnosis not present

## 2019-05-17 DIAGNOSIS — L03119 Cellulitis of unspecified part of limb: Secondary | ICD-10-CM | POA: Diagnosis not present

## 2019-05-17 DIAGNOSIS — M79671 Pain in right foot: Secondary | ICD-10-CM | POA: Diagnosis not present

## 2019-05-17 DIAGNOSIS — L03115 Cellulitis of right lower limb: Secondary | ICD-10-CM | POA: Diagnosis not present

## 2019-05-19 DIAGNOSIS — L97522 Non-pressure chronic ulcer of other part of left foot with fat layer exposed: Secondary | ICD-10-CM | POA: Diagnosis not present

## 2019-05-23 ENCOUNTER — Ambulatory Visit: Payer: PPO | Admitting: Internal Medicine

## 2019-05-23 DIAGNOSIS — L03115 Cellulitis of right lower limb: Secondary | ICD-10-CM | POA: Diagnosis not present

## 2019-05-23 DIAGNOSIS — E1142 Type 2 diabetes mellitus with diabetic polyneuropathy: Secondary | ICD-10-CM | POA: Diagnosis not present

## 2019-05-23 DIAGNOSIS — L97522 Non-pressure chronic ulcer of other part of left foot with fat layer exposed: Secondary | ICD-10-CM | POA: Diagnosis not present

## 2019-05-23 DIAGNOSIS — L97312 Non-pressure chronic ulcer of right ankle with fat layer exposed: Secondary | ICD-10-CM | POA: Diagnosis not present

## 2019-05-23 DIAGNOSIS — M79672 Pain in left foot: Secondary | ICD-10-CM | POA: Diagnosis not present

## 2019-05-30 ENCOUNTER — Telehealth: Payer: Self-pay

## 2019-05-30 ENCOUNTER — Encounter: Payer: Self-pay | Admitting: Pain Medicine

## 2019-05-30 NOTE — Telephone Encounter (Signed)
Pt returned nurse call to go over medications for appt tomorrow

## 2019-05-30 NOTE — Progress Notes (Signed)
Patient: [name]  Service Category: E/M  Provider: Gaspar Cola, MD  DOB: 11-03-1950  DOS: 05/31/2019  Location: Office  MRN: [id number]  Setting: Ambulatory outpatient  Referring Provider: [name], Olivia Mackie *  Type: Established Patient  Specialty: Interventional Pain Management  PCP: [name], [name], MD  Location: Remote location  Delivery: TeleHealth     Virtual Encounter - Pain Management PROVIDER NOTE: Information contained herein reflects review and annotations entered in association with encounter. Interpretation of such information and data should be left to medically-trained personnel. Information provided to patient can be located elsewhere in the medical record under "Patient Instructions". Document created using STT-dictation technology, any transcriptional errors that may result from process are unintentional.    Contact & Pharmacy Preferred: [phone] Home: [phone] (home) Mobile: [phone] (mobile) E-mail: [email or web address]  [place], [place] - [place] [address] [place] [address] Phone: [phone] Fax: [phone]   Pre-screening  [name] offered "in-person" vs "virtual" encounter. She indicated preferring virtual for this encounter.   Reason COVID-19*  Social distancing based on CDC and AMA recommendations.   I contacted [name] on 05/31/2019 via telephone.      I clearly identified myself as Gaspar Cola, MD. I verified that I was speaking with the correct person using two identifiers (Name: KYNISHA MEMON, and date of birth: 1950/08/31).  Consent I sought verbal advanced consent from [name] for virtual visit interactions. I informed Ms. Halk of possible security and privacy concerns, risks, and limitations associated with providing "not-in-person" medical evaluation and management services. I also informed Ms. Ormand of the availability of "in-person"  appointments. Finally, I informed her that there would be a charge for the virtual visit and that she could be  personally, fully or partially, financially responsible for it. Ms. Badertscher expressed understanding and agreed to proceed.   Historic Elements   [name] is a 69 y.o. year old, adult patient evaluated today after her last contact with our practice on 05/30/2019. [name]  has a past medical history of Abnormal antibody titer, Anxiety and depression, Arthritis, Asthma, Cystocele, Depression, Diabetes ([place]), DVT (deep venous thrombosis) ([place]), Dyspnea, Dysrhythmia, Edema, GERD (gastroesophageal reflux disease), Heart murmur, History of IBS, History of kidney stones, History of methicillin resistant staphylococcus aureus (MRSA) (2008), Hyperlipidemia, Hypertension, Hypothyroidism, Insomnia, Kidney stone, Neuropathy involving both lower extremities, Obesity, Class II, BMI 35-39.9, with comorbidity, Paroxysmal atrial fibrillation ([place]) (2007), Peripheral vascular disease ([place]), Sleep apnea, Urinary incontinence, and Venous stasis dermatitis of both lower extremities. She also  has a past surgical history that includes Ankle surgery (Right); Vaginal hysterectomy; Sleeve Gastroplasty; Lithotripsy; Kidney surgery (Right); transthoracic echocardiogram (08/03/2013); transesophageal echocardiogram (08/08/2013); Tonsillectomy; Total knee arthroplasty (Left); Hiatal hernia repair; Cholecystectomy; I & D extremity (Left, 06/27/2015); Application if wound vac (Left, 06/27/2015); removal of left hematoma (Left); NM GATED MYOVIEW [facility] HX) (February 2017); Reverse shoulder arthroplasty (Right, 10/08/2015); Hernia repair; Ulnar nerve transposition (Right, 08/06/2016); arm surgery; Irrigation and debridement abscess (Left, 09/07/2016); Incision and drainage of wound (Left, 09/25/2016); Application if wound vac (Left, 09/25/2016); Colonoscopy; Upper gi endoscopy; Esophagogastroduodenoscopy (egd) with propofol (N/A,  01/11/2017); Colonoscopy with propofol (N/A, 01/11/2017); Colonoscopy with propofol (N/A, 02/22/2017); OTHER SURGICAL HISTORY; Total shoulder replacement (Right); Joint replacement (2013); Colonoscopy with propofol (N/A, 05/31/2017); Lower Extremity Angiography (Left, 04/04/2018); Esophagogastroduodenoscopy (egd) with propofol (N/A, 10/25/2018); and Incision and drainage of wound (Left, 11/09/2018). [name] has a current medication list which includes  the following prescription(s): albuterol, albuterol, alprazolam, amlodipine, bupropion, cetirizine, clobetasol cream, colchicine, dexilant, duloxetine, ergocalciferol, estradiol, fesoterodine, fluticasone-salmeterol, hydrochlorothiazide, [START ON 06/03/2019] hydrocodone-acetaminophen, [START ON 07/03/2019] hydrocodone-acetaminophen, [START ON 08/02/2019] hydrocodone-acetaminophen, levocetirizine, levothyroxine, losartan-hydrochlorothiazide, meloxicam, metformin, metoprolol succinate, montelukast, mupirocin ointment, nystatin cream, ondansetron, oxybutynin, pantoprazole, phenazopyridine, pradaxa, pregabalin, flutter, sucralfate, and zinc oxide, and the following Facility-Administered Medications: ipratropium-albuterol. She  reports that she has never smoked. She has never used smokeless tobacco. She reports that she does not drink alcohol or use drugs. [name] is allergic to morphine and related; penicillins; aspirin; and oxytetracycline.   HPI  Today, she is being contacted for medication management. The patient indicates doing well with the current medication regimen. No adverse reactions or side effects reported to the medications.  The patient indicates currently having a rough time secondary to a left foot fracture that she suffered.  She has a cast and she is scheduled to see Dr. [name] today for follow-up.  She thinks that there may still be a fracture that its not healing.  In any case, we will let Dr. [name] treat this acute pain.  I have provided the  patient with some written information on the patient instructions on how to deal with acute on chronic pain.  Pharmacotherapy Assessment  Analgesic: Hydrocodone/APAP 5/325 mg, 1 tab PO q 8 hours (15 mg/day of hydrocodone) MME/day:15 mg/day.   Monitoring: [place] PMP: PDMP reviewed during this encounter.       Pharmacotherapy: No side-effects or adverse reactions reported. Compliance: No problems identified. Effectiveness: Clinically acceptable. Plan: Refer to "POC".  UDS:  Summary  Date Value Ref Range Status  05/19/2018 FINAL  Final    Comment:    ==================================================================== TOXASSURE SELECT 13 (MW) ==================================================================== Test                             Result       Flag       Units Drug Present and Declared for Prescription Verification   Alprazolam                     111          EXPECTED   ng/mg creat   Alpha-hydroxyalprazolam        162          EXPECTED   ng/mg creat    Source of alprazolam is a scheduled prescription medication.    Alpha-hydroxyalprazolam is an expected metabolite of alprazolam.   Hydrocodone                    560          EXPECTED   ng/mg creat   Hydromorphone                  30           EXPECTED   ng/mg creat   Dihydrocodeine                 86           EXPECTED   ng/mg creat   Norhydrocodone                 943          EXPECTED   ng/mg creat    Sources of hydrocodone include scheduled prescription    medications. Hydromorphone, dihydrocodeine and norhydrocodone are    expected metabolites of hydrocodone. Hydromorphone and  dihydrocodeine are also available as scheduled prescription    medications. ==================================================================== Test                      Result    Flag   Units      Ref Range   Creatinine              206              mg/dL       >=20 ==================================================================== Declared Medications:  The flagging and interpretation on this report are based on the  following declared medications.  Unexpected results may arise from  inaccuracies in the declared medications.  **Note: The testing scope of this panel includes these medications:  Alprazolam  Hydrocodone (Hydrocodone-Acetaminophen)  **Note: The testing scope of this panel does not include following  reported medications:  Acetaminophen (Hydrocodone-Acetaminophen)  Albuterol  Amlodipine Besylate  Bupropion  Calcium citrate (Calcium citrate/Vitamin D)  Clobetasol  Dabigatran  Dexlansoprazole  Duloxetine  Estradiol  Fesoterodine  Fluticasone (Advair)  Gabapentin  Hydrochlorothiazide  Levocetirizine  Levothyroxine  Losartan (Losartan Potassium)  Metformin  Metoprolol  Montelukast  Mupirocin  Nystatin  Ondansetron  Oxybutynin  Pregabalin  Salmeterol (Advair)  Tiotropium  Topical Diclofenac  Vitamin D (Calcium citrate/Vitamin D)  Zinc Oxide ==================================================================== For clinical consultation, please call [phone]. ====================================================================    Laboratory Chemistry Profile   Renal Lab Results  Component Value Date   BUN 21 11/09/2018   CREATININE 1.22 (H) 11/09/2018   BCR 20 10/10/2014   GFR 49.29 (L) 05/12/2018   GFRAA 52 (L) 11/09/2018   GFRNONAA 45 (L) 11/09/2018    Hepatic Lab Results  Component Value Date   AST 18 09/28/2018   ALT 13 09/28/2018   ALBUMIN 3.7 09/28/2018   ALKPHOS 84 09/28/2018   LIPASE 27 07/28/2018    Electrolytes Lab Results  Component Value Date   NA 140 11/09/2018   K 4.6 11/09/2018   CL 105 11/09/2018   CALCIUM 9.2 11/09/2018   MG 1.8 04/17/2015   PHOS 3.4 08/12/2012    Bone Lab Results  Component Value Date   VD25OH 32.15 05/10/2017    Inflammation (CRP: Acute Phase)  (ESR: Chronic Phase) Lab Results  Component Value Date   CRP 14.7 (H) 09/08/2016   ESRSEDRATE 58 (H) 09/08/2016   LATICACIDVEN 1.5 09/28/2018      Note: Above Lab results reviewed.  Imaging  US Venous Img Lower Unilateral Left CLINICAL DATA:  69 year old with left leg swelling and pain. History of DVT.  EXAM: LEFT LOWER EXTREMITY VENOUS DOPPLER ULTRASOUND  TECHNIQUE: Gray-scale sonography with graded compression, as well as color Doppler and duplex ultrasound were performed to evaluate the lower extremity deep venous systems from the level of the common femoral vein and including the common femoral, femoral, profunda femoral, popliteal and calf veins including the posterior tibial, peroneal and gastrocnemius veins when visible. The superficial great saphenous vein was also interrogated. Spectral Doppler was utilized to evaluate flow at rest and with distal augmentation maneuvers in the common femoral, femoral and popliteal veins.  COMPARISON:  10/01/2016  FINDINGS: Contralateral Common Femoral Vein: Respiratory phasicity is normal and symmetric with the symptomatic side. No evidence of thrombus. Normal compressibility.  Common Femoral Vein: No evidence of thrombus. Normal compressibility, respiratory phasicity and response to augmentation.  Saphenofemoral Junction: No evidence of thrombus. Normal compressibility and flow on color Doppler imaging.  Profunda Femoral Vein: No evidence of thrombus. Normal compressibility and  flow on color Doppler imaging.  Femoral Vein: No evidence of thrombus. Normal compressibility, respiratory phasicity and response to augmentation.  Popliteal Vein: No evidence of thrombus. Normal compressibility, respiratory phasicity and response to augmentation.  Calf Veins: Limited evaluation of the deep calf veins due to body habitus and edema.  Other Findings:  None.  IMPRESSION: Negative for deep venous thrombosis in left lower  extremity.  Limited evaluation of the left deep calf veins.  Electronically Signed   By: Markus Daft M.D.   On: 09/30/2018 16:24  Assessment  The primary encounter diagnosis was Chronic pain syndrome. A diagnosis of Fibromyalgia was also pertinent to this visit.  Plan of Care  Problem-specific:  No problem-specific Assessment & Plan notes found for this encounter.  Ms. SAYLAH KETNER has a current medication list which includes the following long-term medication(s): albuterol, albuterol, amlodipine, bupropion, colchicine, dexilant, duloxetine, fluticasone-salmeterol, hydrochlorothiazide, [START ON 06/03/2019] hydrocodone-acetaminophen, [START ON 07/03/2019] hydrocodone-acetaminophen, [START ON 08/02/2019] hydrocodone-acetaminophen, levocetirizine, levothyroxine, losartan-hydrochlorothiazide, metformin, metoprolol succinate, montelukast, pradaxa, pregabalin, and sucralfate.  Pharmacotherapy (Medications Ordered): Meds ordered this encounter  Medications  . HYDROcodone-acetaminophen (NORCO/VICODIN) 5-325 MG tablet    Sig: Take 1 tablet by mouth every 8 (eight) hours as needed for severe pain. Must last 30 days    Dispense:  90 tablet    Refill:  0    Chronic Pain: STOP Act (Not applicable) Fill 1 day early if closed on refill date. Do not fill until: 06/03/2019. To last until: 07/03/2019. Avoid benzodiazepines within 8 hours of opioids  . HYDROcodone-acetaminophen (NORCO/VICODIN) 5-325 MG tablet    Sig: Take 1 tablet by mouth every 8 (eight) hours as needed for severe pain. Must last 30 days    Dispense:  90 tablet    Refill:  0    Chronic Pain: STOP Act (Not applicable) Fill 1 day early if closed on refill date. Do not fill until: 07/03/2019. To last until: 08/02/2019. Avoid benzodiazepines within 8 hours of opioids  . HYDROcodone-acetaminophen (NORCO/VICODIN) 5-325 MG tablet    Sig: Take 1 tablet by mouth every 8 (eight) hours as needed for severe pain. Must last 30 days    Dispense:  90  tablet    Refill:  0    Chronic Pain: STOP Act (Not applicable) Fill 1 day early if closed on refill date. Do not fill until: 08/02/2019. To last until: 09/01/2019. Avoid benzodiazepines within 8 hours of opioids   Orders:  No orders of the defined types were placed in this encounter.  Follow-up plan:   Return in about 13 weeks (around 08/30/2019) for (VV), (MM).      Interventional therapies: Planned, scheduled, and/or pending:   Not at this time. (Always stop Pradaxa x 5 days prior to any blocks.)   Considering:   NOTE: PRADAXA ANTICOAGULATION (Stop: 5 days  Restart: 6 hours) NO LUMBAR RFA until BMI < 35. Diagnostic right suprascapular NB Diagnostic right IA knee injection  Possible series of 5 Hyalgan knee injections Diagnostic right-sided genicular NB Possible right-sided genicular nerve RFA  Diagnostic right-sided suprascapular NB  Possible right-sided suprascapular nerve RFA    Palliative PRN treatment(s):   Palliative right IA shoulder joint injection  Palliative right lateral femoral cutaneous NB Palliative right lateral femoral continuous nerve RFA #2       Recent Visits No visits were found meeting these conditions.  Showing recent visits within past 90 days and meeting all other requirements   Today's Visits Date Type Provider Dept  05/31/19  Telemedicine Milinda Pointer, MD [facility]  Showing today's visits and meeting all other requirements   Future Appointments No visits were found meeting these conditions.  Showing future appointments within next 90 days and meeting all other requirements   I discussed the assessment and treatment plan with the patient. The patient was provided an opportunity to ask questions and all were answered. The patient agreed with the plan and demonstrated an understanding of the instructions.  Patient advised to call back or seek an in-person evaluation if the symptoms or condition worsens.  Duration of encounter: 14  minutes.  Note by: Gaspar Cola, MD Date: 05/31/2019; Time: 9:32 AM

## 2019-05-30 NOTE — Telephone Encounter (Signed)
LM for patient to call office for pre virtual appointment questions.  

## 2019-05-30 NOTE — Telephone Encounter (Signed)
Completed.

## 2019-05-31 ENCOUNTER — Ambulatory Visit: Payer: PPO | Attending: Pain Medicine | Admitting: Pain Medicine

## 2019-05-31 ENCOUNTER — Other Ambulatory Visit: Payer: Self-pay

## 2019-05-31 DIAGNOSIS — M797 Fibromyalgia: Secondary | ICD-10-CM

## 2019-05-31 DIAGNOSIS — M79672 Pain in left foot: Secondary | ICD-10-CM | POA: Diagnosis not present

## 2019-05-31 DIAGNOSIS — G894 Chronic pain syndrome: Secondary | ICD-10-CM

## 2019-05-31 DIAGNOSIS — E1142 Type 2 diabetes mellitus with diabetic polyneuropathy: Secondary | ICD-10-CM | POA: Diagnosis not present

## 2019-05-31 DIAGNOSIS — L97522 Non-pressure chronic ulcer of other part of left foot with fat layer exposed: Secondary | ICD-10-CM | POA: Diagnosis not present

## 2019-05-31 MED ORDER — HYDROCODONE-ACETAMINOPHEN 5-325 MG PO TABS: 1.0000 | ORAL_TABLET | Freq: Three times a day (TID) | ORAL | 0 refills | Status: DC | PRN

## 2019-05-31 NOTE — Patient Instructions (Addendum)
______________________________________________________________________________________________  Weight Management Required  URGENT: Your weight has been found to be adversely affecting your health.  Dear [name]:  Your current Estimated body mass index is 43.69 kg/m as calculated from the following:   Height as of 05/09/19: 5' 5.5" (1.664 m).   Weight as of 05/09/19: 266 lb 9.6 oz (120.9 kg).  Please use the table below to identify your weight category and associated incidence of chronic pain, secondary to your weight.  Body Mass Index (BMI) Classification BMI level (kg/m2) Category Associated incidence of chronic pain  <18  Underweight   18.5-24.9 Ideal body weight   25-29.9 Overweight  20%  30-34.9 Obese (Class I)  68%  35-39.9 Severe obesity (Class II)  136%  >40 Extreme obesity (Class III)  254%   In addition: You will be considered "Morbidly Obese", if your BMI is above 30 and you have one or more of the following conditions which are known to be caused and/or directly associated with obesity: 1.    Type 2 Diabetes (Which in turn can lead to cardiovascular diseases (CVD), stroke, peripheral vascular diseases (PVD), retinopathy, nephropathy, and neuropathy) 2.    Cardiovascular Disease (High Blood Pressure; Congestive Heart Failure; High Cholesterol; Coronary Artery Disease; Angina; or History of Heart Attacks) 3.    Breathing problems (Asthma; obesity-hypoventilation syndrome; obstructive sleep apnea; chronic inflammatory airway disease; reactive airway disease; or shortness of breath) 4.    Chronic kidney disease 5.    Liver disease (nonalcoholic fatty liver disease) 6.    High blood pressure 7.    Acid reflux (gastroesophageal reflux disease; heartburn) 8.    Osteoarthritis (OA) (with any of the following: hip pain; knee pain; and/or low back pain) 9.    Low back pain (Lumbar Facet Syndrome; and/or Degenerative Disc Disease) 10.  Hip pain (Osteoarthritis of hip) (For  every 1 lbs of added body weight, there is a 2 lbs increase in pressure inside of each hip articulation. 1:2 mechanical relationship) 11.  Knee pain (Osteoarthritis of knee) (For every 1 lbs of added body weight, there is a 4 lbs increase in pressure inside of each knee articulation. 1:4 mechanical relationship) (patients with a BMI>30 kg/m2 were 6.8 times more likely to develop knee OA than normal-weight individuals) 12.  Cancer: Epidemiological studies have shown that obesity is a risk factor for: post-menopausal breast cancer; cancers of the endometrium, colon and kidney cancer; malignant adenomas of the oesophagus. Obese subjects have an approximately 1.5-3.5-fold increased risk of developing these cancers compared with normal-weight subjects, and it has been estimated that between 15 and 45% of these cancers can be attributed to overweight. More recent studies suggest that obesity may also increase the risk of other types of cancer, including pancreatic, hepatic and gallbladder cancer. (Ref: Obesity and cancer. Pischon T, Nthlings U, [employer] H. Proc Nutr Soc. 2008 May;67(2):128-45. doi: 16.1096/E4540981191478295.) The International Agency for Research on Cancer (IARC) has identified 13 cancers associated with overweight and obesity: meningioma, multiple myeloma, adenocarcinoma of the esophagus, and cancers of the thyroid, postmenopausal breast cancer, gallbladder, stomach, liver, pancreas, kidney, ovaries, uterus, colon and rectal (colorectal) cancers. 33 percent of all cancers diagnosed in women and 24 percent of those diagnosed in men are associated with overweight and obesity.  Recommendation: At this point it is urgent that you take a step back and concentrate in loosing weight. Dedicate 100% of your efforts on this task. Nothing else will improve your health more than bringing your weight down and your BMI  to less than 30. If you are here, you probably have chronic pain. Because most chronic pain  patients have difficulty exercising secondary to their pain, you must rely on proper nutrition and diet in order to lose the weight. If your BMI is above 40, you should seriously consider bariatric surgery. A realistic goal is to lose 10% of your body weight over a period of 12 months.  Be honest to yourself, if over time you have unsuccessfully tried to lose weight, then it is time for you to seek professional help and to enter a medically supervised weight management program, and/or undergo bariatric surgery. Stop procrastinating.   Pain management considerations:  1.    Pharmacological Problems: Be advised that the use of opioid analgesics (oxycodone; hydrocodone; morphine; methadone; codeine; and all of their derivatives) have been associated with decreased metabolism and weight gain.  For this reason, should we see that you are unable to lose weight while taking these medications, it may become necessary for [place] to taper down and indefinitely discontinue them.  2.    Technical Problems: The incidence of successful interventional therapies decreases as the patient's BMI increases. It is much more difficult to accomplish a safe and effective interventional therapy on a patient with a BMI above 35. 3.    Radiation Exposure Problems: The x-rays machine, used to accomplish injection therapies, will automatically increase their x-ray output in order to capture an appropriate bone image. This means that radiation exposure increases exponentially with the patient's BMI. (The higher the BMI, the higher the radiation exposure.) Although the level of radiation used at a given time is still safe to the patient, it is not for the physician and/or assisting staff. Unfortunately, radiation exposure is accumulative. Because physicians and the staff have to do procedures and be exposed on a daily basis, this can result in health problems such as cancer and radiation burns. Radiation exposure to the staff is monitored by the  radiation batches that they wear. The exposure levels are reported back to the staff on a quarterly basis. Depending on levels of exposure, physicians and staff may be obligated by law to decrease this exposure. This means that they have the right and obligation to refuse providing therapies where they may be overexposed to radiation. For this reason, physicians may decline to offer therapies such as radiofrequency ablation or implants to patients with a BMI above 40. 4.    Current Trends: Be advised that the current trend is to no longer offer certain therapies to patients with a BMI equal to, or above 35, due to increase perioperative risks, increased technical procedural difficulties, and excessive radiation exposure to healthcare personnel.  ______________________________________________________________________________________________    Post-op Pain Management on a Chronic Pain Patient  Why should the surgeon manage his patient's post-op pain? The Surgeon is uniquely qualified to determine the amount of post-operative pain to be expected on a procedure or surgery that he/she has performed. Even with similar surgeries, the surgeon's perspective on expected pain is unique, since he/she performed the procedure and knows the degree of difficulty and/or tissue damage involved in each particular case. The surgeon is also up to date on events such as blood loss, intraoperative complications, and PO (per orum) status that may influence not only the patient's dose and schedule, but route of administration as well.  How about telling chronic pain patients to just double up or increase their usual pain medication intake to compensate for the increased pain? This is a bad  idea since it will lead to the patient running out of his/her usual medications early and this may create a problem at the level of the insurance, which supplies medications based on the amount and schedule stated on the prescription. Running  out early may trigger an event where the refill is denied by the insurance company and/or pharmacy. In addition, this practice provides a very poor paper trail as to why this patient ran out of medication early. In addition, from the perspective of the pain physician, it creates a nightmare in the accounting of the patient's medication.  So, what should I do as a Psychologist, sport and exercise when confronted with a patient that needs surgery and already takes a significant amount of pain medicine for their chronic pain, which may or may not be related to the surgery I have to perform?  This is what the surgeon should do: 1. Do not change the dose or schedule of the pain medications prescribed by the pain specialist. This medication regimen allows for the patient's chronic pain to be under control, so as to bring that patient down to the level of an average individual. 2. Have the patient continue their usual pain management regimen, without any alterations. In addition, manage the post-operative pain as you would on any other "narcotic naive" patient. Do not attempt to compensate for tolerance. This is what the patient's usual regimen will do for you. Simply treat the patient as if they had no chronic pain and as if they were taking no other pain medications. 3. Talk to the patient about the medication, just like you would for anyone else. Do not assume that they are experts in opioids. Make sure you let the patient know that the medication is to be used only if absolutely necessary. (PRN) 4. Prescribe the medication for as long as you would on any other patient undergoing the same type of surgery. Prescribe for the same average amount of time that you would on any other patient. Avoid prescribing for longer periods. 5. Send [place] a copy of the operative report with information about your choice of the post-op pain medication provided. 6. Keep [place] informed of any complications that may prolong the average duration patient's post-op  pain.  If you have any questions, please feel to contact us at [phone]. _____________________________________________________________________________________________

## 2019-06-02 ENCOUNTER — Encounter: Payer: Self-pay | Admitting: Internal Medicine

## 2019-06-02 ENCOUNTER — Ambulatory Visit (INDEPENDENT_AMBULATORY_CARE_PROVIDER_SITE_OTHER): Payer: PPO | Admitting: Internal Medicine

## 2019-06-02 VITALS — BP 134/74 | Ht 65.5 in | Wt 260.0 lb

## 2019-06-02 DIAGNOSIS — R296 Repeated falls: Secondary | ICD-10-CM

## 2019-06-02 DIAGNOSIS — M25572 Pain in left ankle and joints of left foot: Secondary | ICD-10-CM

## 2019-06-02 DIAGNOSIS — R5381 Other malaise: Secondary | ICD-10-CM

## 2019-06-02 DIAGNOSIS — J4540 Moderate persistent asthma, uncomplicated: Secondary | ICD-10-CM

## 2019-06-02 DIAGNOSIS — R269 Unspecified abnormalities of gait and mobility: Secondary | ICD-10-CM | POA: Diagnosis not present

## 2019-06-02 DIAGNOSIS — M79672 Pain in left foot: Secondary | ICD-10-CM

## 2019-06-02 DIAGNOSIS — G8929 Other chronic pain: Secondary | ICD-10-CM | POA: Diagnosis not present

## 2019-06-02 NOTE — Progress Notes (Signed)
telephone Note  I connected with [name]  on 06/02/19 at  2:20 PM EST by telephone and verified that I am speaking with the correct person using two identifiers.  Location patient: home Location provider:work or home office Persons participating in the virtual visit: patient, provider  I discussed the limitations of evaluation and management by telemedicine and the availability of in person appointments. The patient expressed understanding and agreed to proceed.   HPI: 1. Increased falls and left foot pain ankle pain had fall 5-6 days ago and hurt left foot and ankle with upcoming appt with Dr. [name]  With left heel ulcer >1 year she wants to get this healed so she can go she great grandson Lurena Joiner her granddaughter is having a baby. She does report left heel wound is healed but having chronic left ankle foot pain  -will write letter to have patients shower upgraded/replaced due to falls and hard to take a bath For left ankle/foot pain she has norco tid prn  Last fall 05/31/19 in shower  appt for f/u with Dr. [name]   2. Persistent moderate asthma appt with Dr. Ronalee Belts 07/2019 to f/u  She report she is coughing qhs and using Albuterol neb, Advair, duoneb, and flutter valve will f/u pulm    ROS: See pertinent positives and negatives per HPI.  Past Medical History:  Diagnosis Date  . Abnormal antibody titer   . Anxiety and depression   . Arthritis   . Asthma   . Cystocele   . Depression   . Diabetes ([place])   . DVT (deep venous thrombosis) (H[facility])    Righ calf  . Dyspnea    11/08/2018 - "more now due to lack of exercise"  . Dysrhythmia    afib  . Edema   . GERD (gastroesophageal reflux disease)   . Heart murmur    Echocardiogram June 2015: Mild MR (possible vegetation seen on TTE, not seen on TEE), normal LV size with moderate concentric LVH. Normal function EF 60-65%. Normal diastolic function. Mild LA dilation.  Marland Kitchen History of IBS   . History of kidney stones   . History  of methicillin resistant staphylococcus aureus (MRSA) 2008  . Hyperlipidemia   . Hypertension   . Hypothyroidism   . Insomnia   . Kidney stone    kidney stones with lithotripsy .  [facility]- "adrenal glands"  . Neuropathy involving both lower extremities   . Obesity, Class II, BMI 35-39.9, with comorbidity   . Paroxysmal atrial fibrillation ([place]) 2007   In June 2015, Cardiac Event Monitor: Mostly SR/sinus arrhythmia with PVCs that are frequent. Short bursts of A. fib lasting several minutes;; CHA2DS2-VASc Score = 5 (age, Female, PVD, DM, HTN)  . Peripheral vascular disease ([place])   . Sleep apnea    use C-PAP  . Urinary incontinence   . Venous stasis dermatitis of both lower extremities     Past Surgical History:  Procedure Laterality Date  . ANKLE SURGERY Right   . APPLICATION OF WOUND VAC Left 06/27/2015   Procedure: APPLICATION OF WOUND VAC ( POSSIBLE ) ;  Surgeon: [name], MD;  Location: [facility] ORS;  Service: Vascular;  Laterality: Left;  . APPLICATION OF WOUND VAC Left 09/25/2016   Procedure: APPLICATION OF WOUND VAC;  Surgeon: [name], DPM;  Location: [facility] ORS;  Service: Podiatry;  Laterality: Left;  . arm surgery     right    . CHOLECYSTECTOMY    . COLONOSCOPY    .  COLONOSCOPY WITH PROPOFOL N/A 01/11/2017   Procedure: COLONOSCOPY WITH PROPOFOL;  Surgeon: [name], MD;  Location: [facility] ENDOSCOPY;  Service: Endoscopy;  Laterality: N/A;  . COLONOSCOPY WITH PROPOFOL N/A 02/22/2017   Procedure: COLONOSCOPY WITH PROPOFOL;  Surgeon: [name], MD;  Location: [facility] ENDOSCOPY;  Service: Endoscopy;  Laterality: N/A;  . COLONOSCOPY WITH PROPOFOL N/A 05/31/2017   Procedure: COLONOSCOPY WITH PROPOFOL;  Surgeon: [name], MD;  Location: [facility] ENDOSCOPY;  Service: Endoscopy;  Laterality: N/A;  . ESOPHAGOGASTRODUODENOSCOPY (EGD) WITH PROPOFOL N/A 01/11/2017   Procedure: ESOPHAGOGASTRODUODENOSCOPY (EGD) WITH PROPOFOL;  Surgeon: [name], MD;   Location: [facility] ENDOSCOPY;  Service: Endoscopy;  Laterality: N/A;  . ESOPHAGOGASTRODUODENOSCOPY (EGD) WITH PROPOFOL N/A 10/25/2018   Procedure: ESOPHAGOGASTRODUODENOSCOPY (EGD) WITH PROPOFOL;  Surgeon: [name], MD;  Location: [facility] ENDOSCOPY;  Service: Endoscopy;  Laterality: N/A;  . HERNIA REPAIR     umbilical  . HIATAL HERNIA REPAIR    . I & D EXTREMITY Left 06/27/2015   Procedure: IRRIGATION AND DEBRIDEMENT EXTREMITY            ( CALF HEMATOMA ) POSSIBLE WOUND VAC;  Surgeon: [name], MD;  Location: [facility] ORS;  Service: Vascular;  Laterality: Left;  . INCISION AND DRAINAGE OF WOUND Left 09/25/2016   Procedure: IRRIGATION AND DEBRIDEMENT - PARTIAL RESECTION OF ACHILLES TENDON WITH WOUND VAC APPLICATION;  Surgeon: [name], DPM;  Location: [facility] ORS;  Service: Podiatry;  Laterality: Left;  . INCISION AND DRAINAGE OF WOUND Left 11/09/2018   Procedure: Excision of left foot wound with ACell placement;  Surgeon: Wallace Going, DO;  Location: [place];  Service: Plastics;  Laterality: Left;  . IRRIGATION AND DEBRIDEMENT ABSCESS Left 09/07/2016   Procedure: IRRIGATION AND DEBRIDEMENT ABSCESS LEFT HEEL;  Surgeon: [name], DPM;  Location: [facility] ORS;  Service: Podiatry;  Laterality: Left;  . JOINT REPLACEMENT  2013   left knee replacement  . KIDNEY SURGERY Right    kidney stones  . LITHOTRIPSY    . LOWER EXTREMITY ANGIOGRAPHY Left 04/04/2018   Procedure: LOWER EXTREMITY ANGIOGRAPHY;  Surgeon: [name], MD;  Location: [place] CV LAB;  Service: Cardiovascular;  Laterality: Left;  . NM GATED MYOVIEW ([place] HX)  February 2017   Likely breast attenuation. LOW RISK study. Normal EF 55-60%.  . OTHER SURGICAL HISTORY     08/2016 or 09/2016 surgery on achilles tenso h/o staph infection heal. Dr. [name]  . removal of left hematoma Left    leg  . REVERSE SHOULDER ARTHROPLASTY Right 10/08/2015   Procedure: REVERSE SHOULDER ARTHROPLASTY;  Surgeon: [name], MD;  Location: [facility]  ORS;  Service: Orthopedics;  Laterality: Right;  . SLEEVE GASTROPLASTY    . TONSILLECTOMY    . TOTAL KNEE ARTHROPLASTY Left   . TOTAL SHOULDER REPLACEMENT Right   . TRANSESOPHAGEAL ECHOCARDIOGRAM  08/08/2013   Mild LVH, EF 60-65%. Moderate LA dilation and mild RA dilation. Mild MR with no evidence of stenosis and no evidence of endocarditis. A false chordae is noted.  . TRANSTHORACIC ECHOCARDIOGRAM  08/03/2013   Mild-Moderate concentric LVH, EF 60-65%. Normal diastolic function. Mild LA dilation. Mild MR with possible vegetation  - not confirmed on TEE   . ULNAR NERVE TRANSPOSITION Right 08/06/2016   Procedure: ULNAR NERVE DECOMPRESSION/TRANSPOSITION;  Surgeon: [name], MD;  Location: [facility] ORS;  Service: Orthopedics;  Laterality: Right;  . UPPER GI ENDOSCOPY    . VAGINAL HYSTERECTOMY      Family  History  Problem Relation Age of Onset  . Skin cancer Father   . Diabetes Father   . Hypertension Father   . Peripheral vascular disease Father   . Cancer Father   . Cerebral aneurysm Father   . Alcohol abuse Father   . Varicose Veins Mother   . Kidney disease Mother   . Arthritis Mother   . Mental illness Sister   . Cancer Maternal Aunt        breast  . Breast cancer Maternal Aunt   . Arthritis Maternal Grandmother   . Hypertension Maternal Grandmother   . Diabetes Maternal Grandmother   . Arthritis Maternal Grandfather   . Heart disease Maternal Grandfather   . Hypertension Maternal Grandfather   . Arthritis Paternal Grandmother   . Hypertension Paternal Grandmother   . Diabetes Paternal Grandmother   . Arthritis Paternal Grandfather   . Hypertension Paternal Grandfather   . Bladder Cancer Neg Hx   . Kidney cancer Neg Hx     SOCIAL HX: lives with husband    Current Outpatient Medications:  .  albuterol (PROVENTIL HFA;VENTOLIN HFA) 108 (90 Base) MCG/ACT inhaler, Inhale 1-2 puffs into the lungs every 4 (four) hours as needed for wheezing or shortness of breath. (Patient  taking differently: Inhale 2 puffs into the lungs every 4 (four) hours as needed for wheezing or shortness of breath. ), Disp: 8 g, Rfl: 2 .  albuterol (PROVENTIL) (2.5 MG/3ML) 0.083% nebulizer solution, Take 3 mLs (2.5 mg total) by nebulization every 6 (six) hours as needed for wheezing or shortness of breath., Disp: 150 mL, Rfl: 1 .  amLODipine (NORVASC) 5 MG tablet, Take 1 tablet (5 mg total) by mouth daily., Disp: 90 tablet, Rfl: 3 .  buPROPion (WELLBUTRIN XL) 150 MG 24 hr tablet, Take 1 tablet (150 mg total) by mouth daily., Disp: 90 tablet, Rfl: 3 .  cetirizine (ZYRTEC) 10 MG tablet, cetirizine 10 mg tablet  TAKE 1 TABLET ONCE A DAY ORALLY, Disp: , Rfl:  .  clobetasol cream (TEMOVATE) [id number] %, Apply 1 application topically 2 (two) times daily. To arms (Patient taking differently: Apply 1 application topically 2 (two) times daily as needed (irritation). To arms), Disp: 60 g, Rfl: 1 .  colchicine 0.6 MG tablet, Take by mouth., Disp: , Rfl:  .  dexlansoprazole (DEXILANT) 60 MG capsule, Take 60 mg by mouth daily before breakfast., Disp: , Rfl:  .  DULoxetine (CYMBALTA) 60 MG capsule, Take 1 capsule (60 mg total) by mouth daily., Disp: 30 capsule, Rfl: 5 .  ergocalciferol (VITAMIN D2) 1.25 MG (50000 UT) capsule, ergocalciferol (vitamin D2) 1,250 mcg (50,000 unit) capsule  Take 1 capsule every week by oral route in the morning., Disp: , Rfl:  .  estradiol (ESTRACE) 0.1 MG/GM vaginal cream, Place 1 Applicatorful vaginally every Monday, Wednesday, and Friday. Apply 0.5mg  (pea-sized amount)  just inside the vaginal introitus with a finger-tip on Monday, Wednesday and Friday nights,, Disp: 42.5 g, Rfl: 11 .  fesoterodine (TOVIAZ) 8 MG TB24 tablet, Take 1 tablet (8 mg total) by mouth daily., Disp: 90 tablet, Rfl: 3 .  fluticasone-salmeterol (ADVAIR HFA) 115-21 MCG/ACT inhaler, Inhale 2 puffs into the lungs daily. Rinse mouth thoroughly after use, Disp: 1 Inhaler, Rfl: 12 .  hydrochlorothiazide (HYDRODIURIL)  12.5 MG tablet, Take 1 tablet (12.5 mg total) by mouth daily. In am, Disp: 90 tablet, Rfl: 3 .  HYDROcodone-acetaminophen (NORCO/VICODIN) 5-325 MG tablet, Take 1 tablet by mouth every 8 (eight) hours as needed  for severe pain. Must last 30 days, Disp: 90 tablet, Rfl: 0 .  HYDROcodone-acetaminophen (NORCO/VICODIN) 5-325 MG tablet, Take 1 tablet by mouth every 8 (eight) hours as needed for severe pain. Must last 30 days, Disp: 90 tablet, Rfl: 0 .  [START ON 08/02/2019] HYDROcodone-acetaminophen (NORCO/VICODIN) 5-325 MG tablet, Take 1 tablet by mouth every 8 (eight) hours as needed for severe pain. Must last 30 days, Disp: 90 tablet, Rfl: 0 .  levocetirizine (XYZAL) 5 MG tablet, Take 1 tablet (5 mg total) by mouth every evening., Disp: 90 tablet, Rfl: 3 .  levothyroxine (SYNTHROID) 175 MCG tablet, Take 1 tablet (175 mcg total) by mouth daily before breakfast., Disp: 90 tablet, Rfl: 3 .  losartan-hydrochlorothiazide (HYZAAR) 50-12.5 MG tablet, Take 1 tablet by mouth daily., Disp: , Rfl:  .  metFORMIN (GLUCOPHAGE) 1000 MG tablet, Take 1 tablet (1,000 mg total) by mouth 2 (two) times daily. With food, Disp: 180 tablet, Rfl: 3 .  montelukast (SINGULAIR) 10 MG tablet, Take 1 tablet (10 mg total) by mouth at bedtime., Disp: 90 tablet, Rfl: 3 .  nystatin cream (MYCOSTATIN), Apply 1 application topically 2 (two) times daily. Mouth prn for 7 days as needed (Patient taking differently: Apply 1 application topically 2 (two) times daily as needed for dry skin. Mouth prn for 7 days as needed), Disp: 30 g, Rfl: 11 .  ondansetron (ZOFRAN) 4 MG tablet, Take 1 tablet (4 mg total) by mouth every 8 (eight) hours as needed for nausea or vomiting., Disp: 90 tablet, Rfl: 1 .  oxybutynin (DITROPAN XL) 15 MG 24 hr tablet, TAKE ONE TABLET BY MOUTH AT BEDTIME (Patient taking differently: Take 15 mg by mouth at bedtime. ), Disp: 90 tablet, Rfl: 3 .  pregabalin (LYRICA) 50 MG capsule, Take 1 capsule (50 mg total) by mouth 3 (three)  times daily., Disp: 90 capsule, Rfl: 5 .  Respiratory Therapy Supplies (FLUTTER) DEVI, Use as directed., Disp: 1 each, Rfl: 0 .  ALPRAZolam (XANAX) 0.5 MG tablet, Take 1 tablet (0.5 mg total) by mouth 2 (two) times daily as needed for anxiety., Disp: 60 tablet, Rfl: 2 .  meloxicam (MOBIC) 15 MG tablet, Take 1 tablet (15 mg total) by mouth daily as needed for pain., Disp: 90 tablet, Rfl: 0 .  metoprolol succinate (TOPROL-XL) 50 MG 24 hr tablet, TAKE 1 TABLET BY MOUTH DAILY, Disp: 90 tablet, Rfl: 0 .  mupirocin ointment (BACTROBAN) 2 %, Apply 1 application topically 2 (two) times daily. Skin wounds (Patient not taking: Reported on 06/02/2019), Disp: 30 g, Rfl: 0 .  pantoprazole (PROTONIX) 40 MG tablet, Take by mouth., Disp: , Rfl:  .  PRADAXA 150 MG CAPS capsule, TAKE 1 CAPSULE TWICE DAILY, Disp: 180 capsule, Rfl: 0 .  zinc oxide 20 % ointment, Apply 1 application topically as needed for irritation. (Patient not taking: Reported on 06/02/2019), Disp: 56.7 g, Rfl: 0  Current Facility-Administered Medications:  .  ipratropium-albuterol (DUONEB) 0.5-2.5 (3) MG/3ML nebulizer solution 3 mL, 3 mL, Nebulization, Q6H, [name], [name], MD  EXAM:  VITALS per patient if applicable:  GENERAL: alert, oriented, appears well and in no acute distress  HEENT: atraumatic, conjunttiva clear, no obvious abnormalities on inspection of external nose and ears  NECK: normal movements of the head and neck  LUNGS: on inspection no signs of respiratory distress, breathing rate appears normal, no obvious gross SOB, gasping or wheezing  CV: no obvious cyanosis  MS: moves all visible extremities without noticeable abnormality  PSYCH/NEURO: pleasant and  cooperative, no obvious depression or anxiety, speech and thought processing grossly intact  ASSESSMENT AND PLAN:  Discussed the following assessment and plan:  Left foot/ankle pain chronic  Per pt heel wound left healed  F/u Dr. [name] upcoming appt   Bedside commode  Toilet seat elevator   Recurrent falls Physical deconditioning Abnormality of gait and mobility -consider PT/OT  Has [facility] coming to house will see if they can do or if needs H/H   Asthma, moderate persistent, well-controlled F/u pulm 07/2019 Dr. Ronalee Belts   HM Flu shot utd utdpna 23, prevnar Tdaputd considershingrix in future Given Rx twinrixin pastto get at pharmacy h/o fatty liver covid had 1/2   DEXA 10/17/14 normal  Out of age windowpapOb/gyn  mammo neg5/20/19 negative -referred previously  [facility] with diverticulosis and polypsmultiple tubular and hyperplastic due in 2023  EGD had 10/25/2018 repeat due in 1 year [facility] Dr. Gustavo Lah  -we discussed possible serious and likely etiologies, options for evaluation and workup, limitations of telemedicine visit vs in person visit, treatment, treatment risks and precautions. Pt prefers to treat via telemedicine empirically rather then risking or undertaking an in person visit at this moment. Patient agrees to seek prompt in person care if worsening, new symptoms arise, or if is not improving with treatment.   I discussed the assessment and treatment plan with the patient. The patient was provided an opportunity to ask questions and all were answered. The patient agreed with the plan and demonstrated an understanding of the instructions.   The patient was advised to call back or seek an in-person evaluation if the symptoms worsen or if the condition fails to improve as anticipated.  Time spent 20 minutes Delorise Jackson, MD

## 2019-06-04 ENCOUNTER — Other Ambulatory Visit: Payer: Self-pay | Admitting: Cardiovascular Disease

## 2019-06-04 DIAGNOSIS — I4891 Unspecified atrial fibrillation: Secondary | ICD-10-CM

## 2019-06-13 ENCOUNTER — Other Ambulatory Visit: Payer: Self-pay | Admitting: Internal Medicine

## 2019-06-13 DIAGNOSIS — M1990 Unspecified osteoarthritis, unspecified site: Secondary | ICD-10-CM

## 2019-06-13 MED ORDER — MELOXICAM 15 MG PO TABS: 15.0000 mg | ORAL_TABLET | Freq: Every day | ORAL | 0 refills | Status: DC | PRN

## 2019-06-13 NOTE — Telephone Encounter (Signed)
Pended for your approval or denial.  ° °

## 2019-06-14 DIAGNOSIS — L97522 Non-pressure chronic ulcer of other part of left foot with fat layer exposed: Secondary | ICD-10-CM | POA: Diagnosis not present

## 2019-06-14 DIAGNOSIS — E1142 Type 2 diabetes mellitus with diabetic polyneuropathy: Secondary | ICD-10-CM | POA: Diagnosis not present

## 2019-06-15 ENCOUNTER — Ambulatory Visit: Payer: PPO | Admitting: Pulmonary Disease

## 2019-06-19 DIAGNOSIS — L97521 Non-pressure chronic ulcer of other part of left foot limited to breakdown of skin: Secondary | ICD-10-CM | POA: Diagnosis not present

## 2019-06-19 DIAGNOSIS — M79672 Pain in left foot: Secondary | ICD-10-CM | POA: Diagnosis not present

## 2019-06-19 DIAGNOSIS — E1142 Type 2 diabetes mellitus with diabetic polyneuropathy: Secondary | ICD-10-CM | POA: Diagnosis not present

## 2019-06-26 DIAGNOSIS — E1142 Type 2 diabetes mellitus with diabetic polyneuropathy: Secondary | ICD-10-CM | POA: Diagnosis not present

## 2019-06-26 DIAGNOSIS — L97522 Non-pressure chronic ulcer of other part of left foot with fat layer exposed: Secondary | ICD-10-CM | POA: Diagnosis not present

## 2019-06-29 ENCOUNTER — Ambulatory Visit: Payer: PPO | Attending: Internal Medicine

## 2019-06-29 DIAGNOSIS — Z23 Encounter for immunization: Secondary | ICD-10-CM

## 2019-06-29 NOTE — Progress Notes (Signed)
   Covid-19 Vaccination Clinic  Name:  [name]    MRN: [id number] DOB: May 25, 1950  06/29/2019  [name] was observed post Covid-19 immunization for 15 minutes without incident. She was provided with Vaccine Information Sheet and instruction to access the V-Safe system.   [name] was instructed to call 911 with any severe reactions post vaccine: Marland Kitchen Difficulty breathing  . Swelling of face and throat  . A fast heartbeat  . A bad rash all over body  . Dizziness and weakness   Immunizations Administered    Name Date Dose VIS Date Route   Pfizer COVID-19 Vaccine 06/29/2019 10:47 AM 0.3 mL 03/03/2019 Intramuscular   Manufacturer: [place]   Lot: [id number]   [place]: [id number]

## 2019-06-30 ENCOUNTER — Ambulatory Visit: Payer: PPO | Admitting: Pulmonary Disease

## 2019-07-03 ENCOUNTER — Telehealth: Payer: Self-pay | Admitting: Internal Medicine

## 2019-07-03 NOTE — Telephone Encounter (Signed)
Patient was seen today by NP from [facility]. She states:  Had two more falls since being seen. State she cannot get off a chair by herself or off the commode. Patient needing a bedside commode. States patient is using her walker and cane to help her up   164/89 hr 55. This was taking today by [place], Jacinto Halim, NP.    Patient states she took her BP medications at 9:30 am and her BP was taken by the NP at 2:00 pm. Patient states that her BP has been good otherwise. States she goes every other week to see Dr [name] and BP was good.   Patient states she would also like a booster seat for her toilet as she verified it is hard for her to get up and down off the commode.

## 2019-07-04 ENCOUNTER — Other Ambulatory Visit: Payer: Self-pay | Admitting: Cardiovascular Disease

## 2019-07-04 ENCOUNTER — Other Ambulatory Visit: Payer: Self-pay | Admitting: Pain Medicine

## 2019-07-04 ENCOUNTER — Other Ambulatory Visit: Payer: Self-pay | Admitting: Internal Medicine

## 2019-07-04 DIAGNOSIS — M797 Fibromyalgia: Secondary | ICD-10-CM

## 2019-07-04 DIAGNOSIS — E1142 Type 2 diabetes mellitus with diabetic polyneuropathy: Secondary | ICD-10-CM

## 2019-07-04 DIAGNOSIS — M7918 Myalgia, other site: Secondary | ICD-10-CM

## 2019-07-04 DIAGNOSIS — G8929 Other chronic pain: Secondary | ICD-10-CM

## 2019-07-04 DIAGNOSIS — G894 Chronic pain syndrome: Secondary | ICD-10-CM

## 2019-07-04 DIAGNOSIS — F419 Anxiety disorder, unspecified: Secondary | ICD-10-CM

## 2019-07-04 MED ORDER — ALPRAZOLAM 0.5 MG PO TABS: 0.5000 mg | ORAL_TABLET | Freq: Two times a day (BID) | ORAL | 2 refills | Status: DC | PRN

## 2019-07-07 IMAGING — DX DG CHEST 1V
1 series · 1 of 1 positions shown · non-contrast
Comparison: Chest CT 09/30/2016

CLINICAL DATA: Cough and shortness of breath

EXAM:
CHEST 1 VIEW

[chest ap]
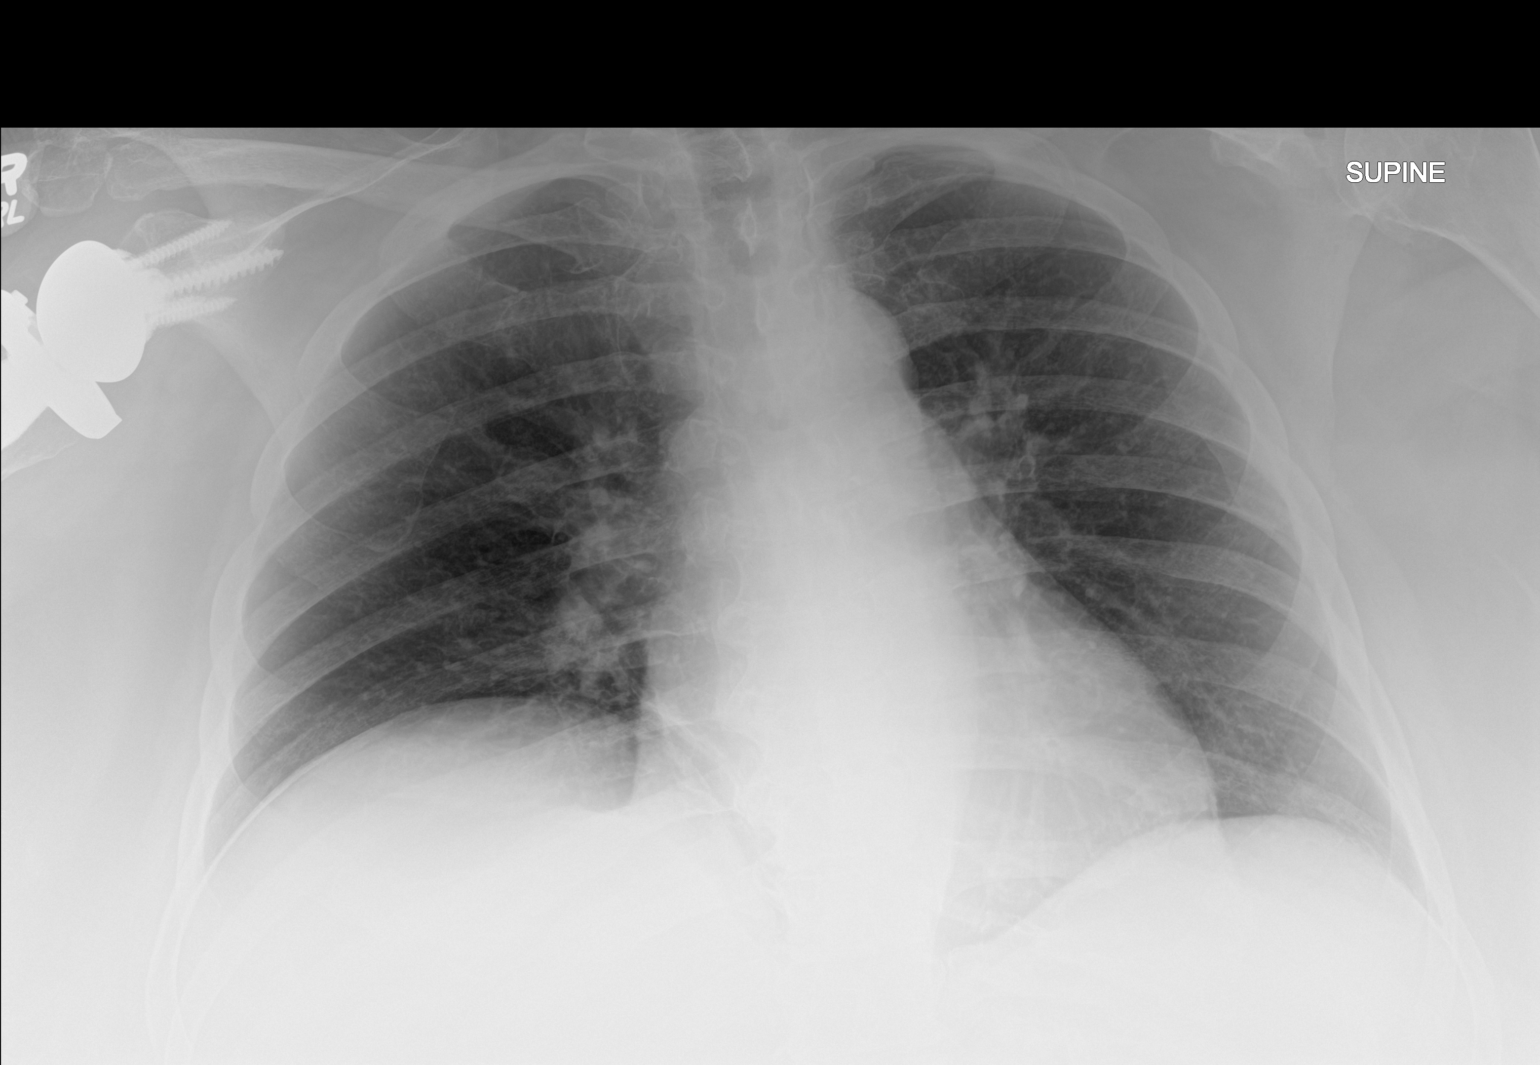

[1 of 1 positions shown; findings below may reference images not displayed]

FINDINGS: Shallow lung inflation. The heart size and mediastinal contours are
within normal limits. Both lungs are clear. Right shoulder
arthroplasty.
IMPRESSION: Shallow lung inflation without focal airspace disease.

## 2019-07-10 ENCOUNTER — Encounter: Payer: Self-pay | Admitting: Internal Medicine

## 2019-07-10 DIAGNOSIS — R296 Repeated falls: Secondary | ICD-10-CM | POA: Insufficient documentation

## 2019-07-10 DIAGNOSIS — E1142 Type 2 diabetes mellitus with diabetic polyneuropathy: Secondary | ICD-10-CM | POA: Diagnosis not present

## 2019-07-10 DIAGNOSIS — L97522 Non-pressure chronic ulcer of other part of left foot with fat layer exposed: Secondary | ICD-10-CM | POA: Diagnosis not present

## 2019-07-10 NOTE — Telephone Encounter (Signed)
Call NP from [facility]  -do we need to have husband pick up Rx for bedside commode and toilet booster seat and take to home health agency with medical supplies or medical supply store?   Or -can [facility] help facilitate this?   AND  -does [facility] do PT/OT at home?   -if not does pt want this ordered for falls at home?    For BP -schedule office visit if pt does not have one already in person to address BP

## 2019-07-12 ENCOUNTER — Telehealth: Payer: Self-pay | Admitting: Internal Medicine

## 2019-07-12 NOTE — Telephone Encounter (Signed)
Lm to call office and schedule fasting labs asap and a 3 to 4 month follow up.

## 2019-07-12 NOTE — Telephone Encounter (Signed)
Spoke with Lattie Haw from [facility]. She states they do not facilitate supplies or do the PT/OT.   Spoke with the patient and she already has the bedside commode and is now only needing the Booster toilet seat. Patient would like a order to come pick up and shop around at different medical supply companies.   Pt would like to hold off on OT/PT as she states her falls come from the boot on her foot. She has 6 more weeks left with the boot on and then she would like to see how she does. States she is not falling as often now that she is getting used to the boot.

## 2019-07-14 ENCOUNTER — Telehealth: Payer: Self-pay | Admitting: Internal Medicine

## 2019-07-14 ENCOUNTER — Encounter: Payer: Self-pay | Admitting: Internal Medicine

## 2019-07-14 NOTE — Telephone Encounter (Signed)
1. Does pt want home PT/OT for falls 2. For DME bedside commode and toilet seat elevator Rx is ready for pt to pick up or  Can fax to [facility] agency which one are we doing?   [place]

## 2019-07-14 NOTE — Telephone Encounter (Signed)
Pt declines OT/PT for now. Order has been placed upfront for patient to pick up as she would like to check out coverage at multiple [employer] [facility].

## 2019-07-14 NOTE — Telephone Encounter (Signed)
Rx ready for pick up toilet seat elevator in box

## 2019-07-14 NOTE — Telephone Encounter (Signed)
Faxed over letter to Dr. [name]

## 2019-07-14 NOTE — Telephone Encounter (Signed)
Left message informing patient and that I will also send mychart message.

## 2019-07-16 IMAGING — US US RENAL
1 series · 14 of 25 positions shown · non-contrast
Comparison: CT Abdomen and Pelvis 08/12/2017 and earlier.

CLINICAL DATA: 67-year-old female with recurrent urinary tract
infections.

EXAM:
RENAL / URINARY TRACT ULTRASOUND COMPLETE

[Series 1: us renal · 0.27mm/px · 14 of 26 slices shown]
[im 1/26]
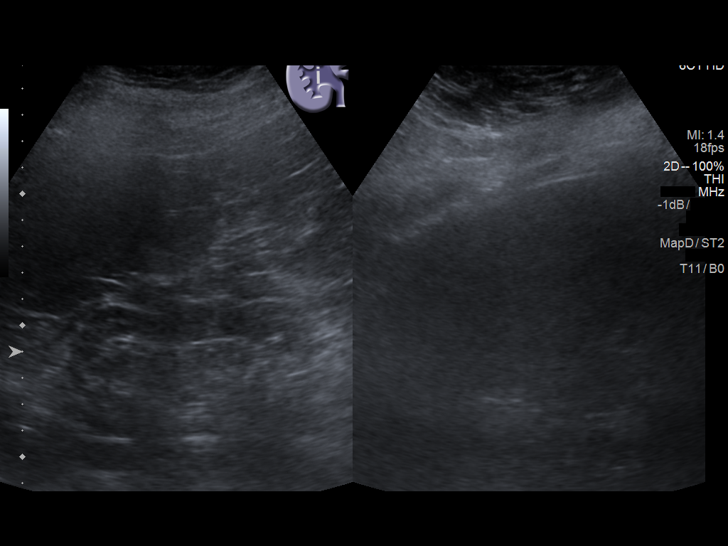
[im 3/26]
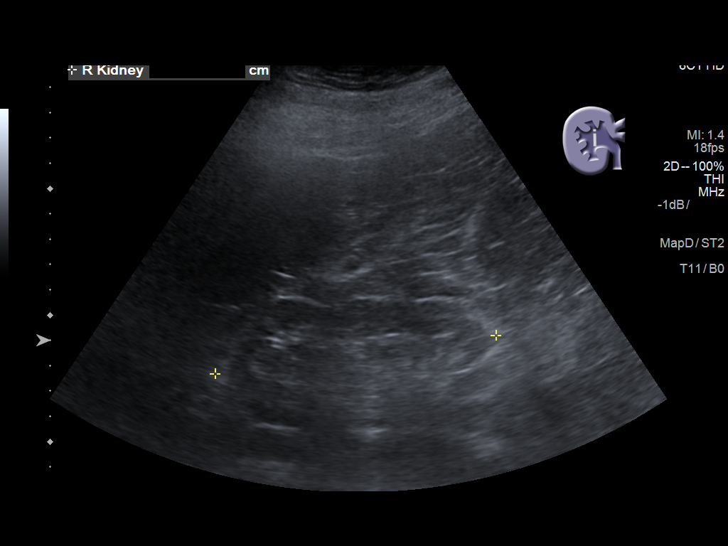
[im 5/26]
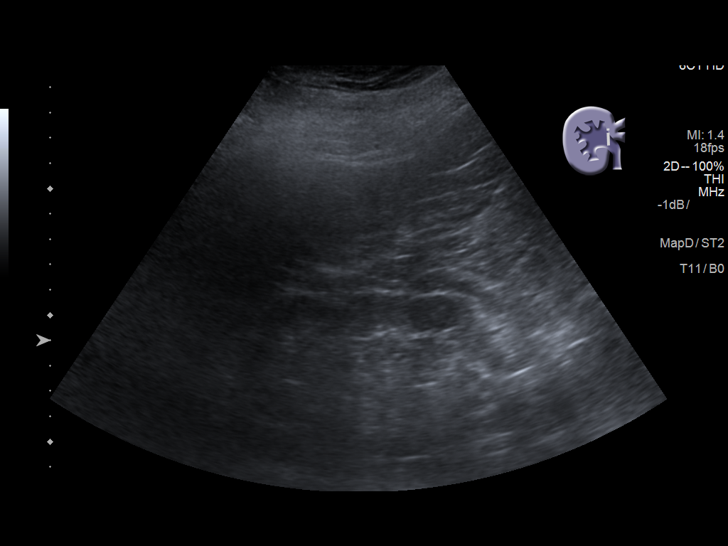
[im 7/26]
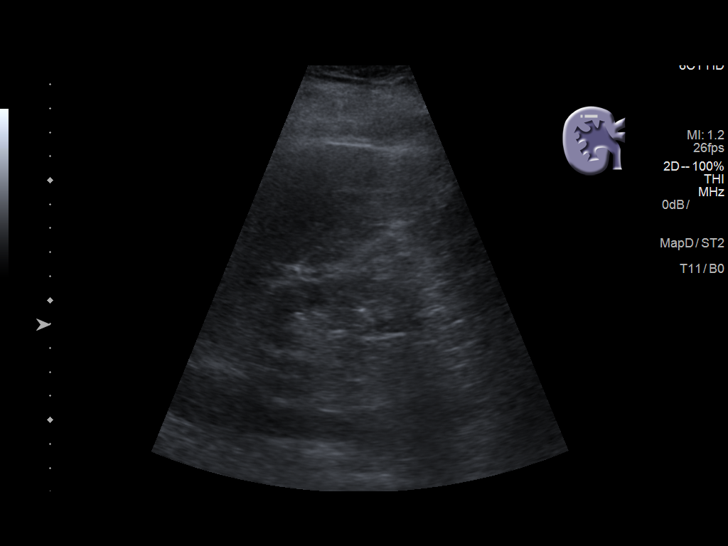
[im 9/26]
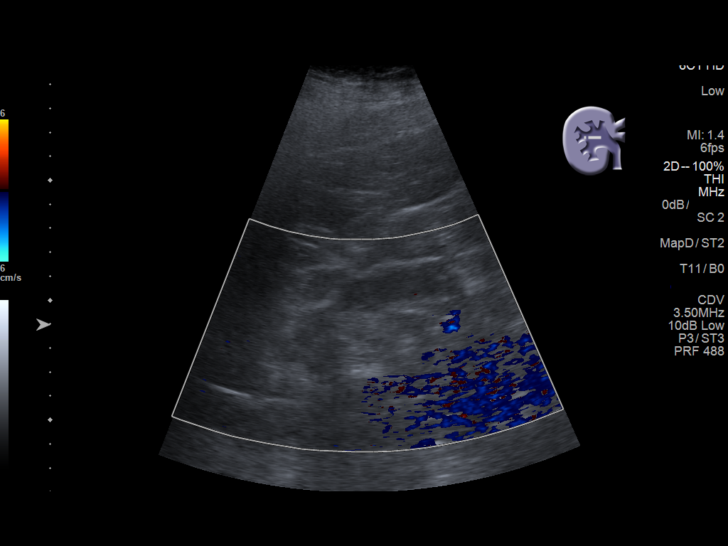
[im 10/26]
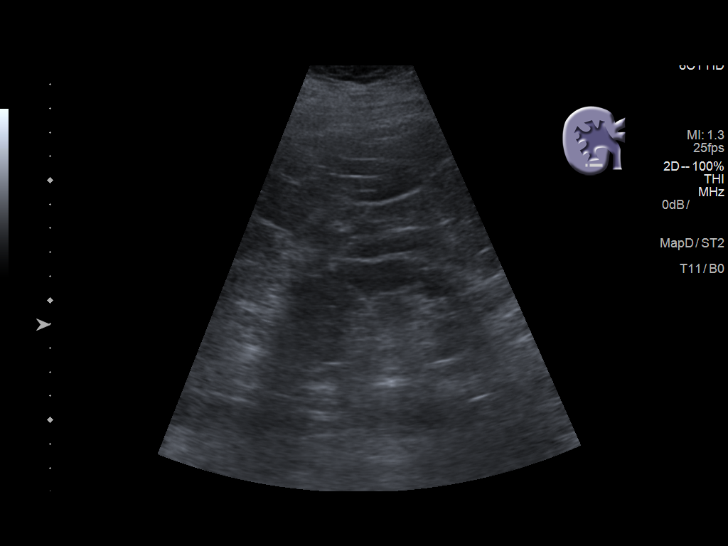
[im 12/26]
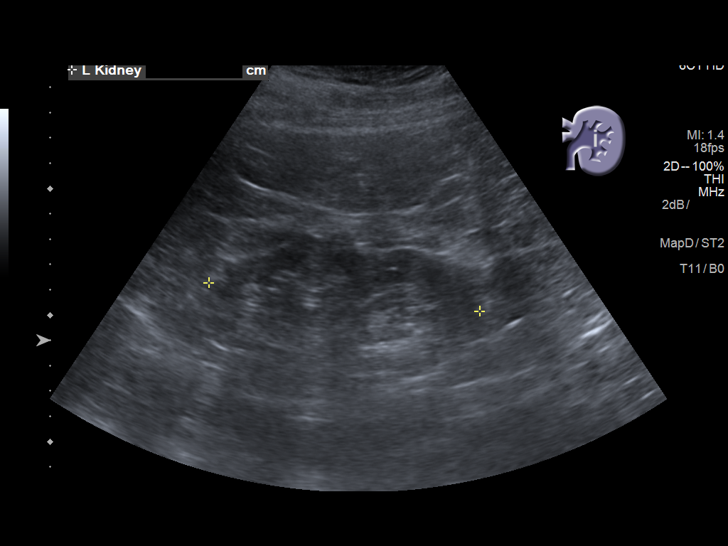
[im 14/26]
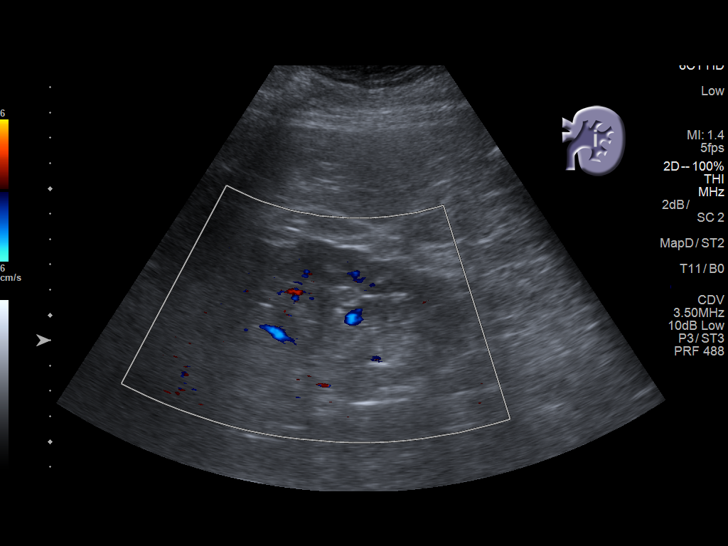
[im 16/26]
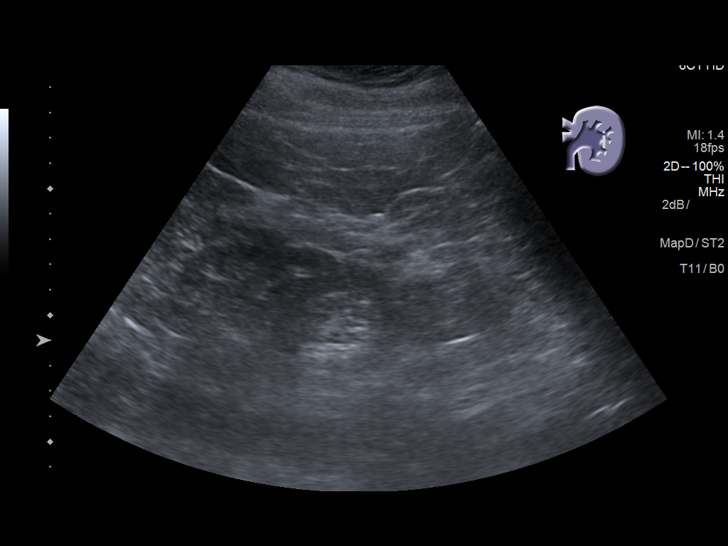
[im 17/26]
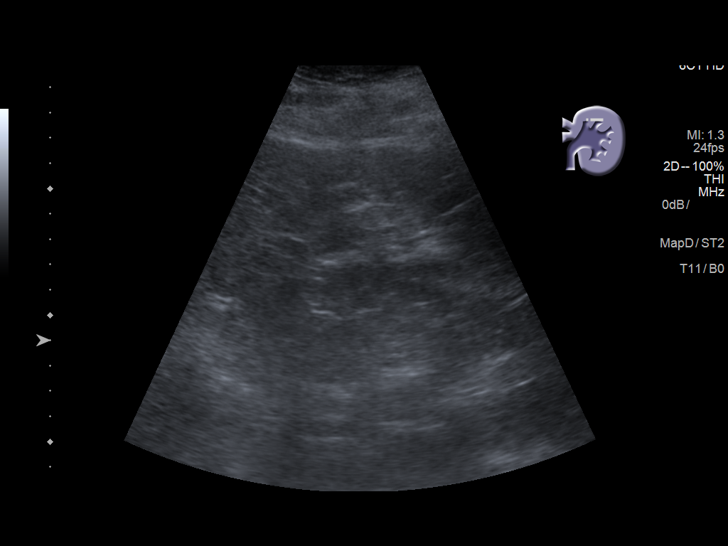
[im 19/26]
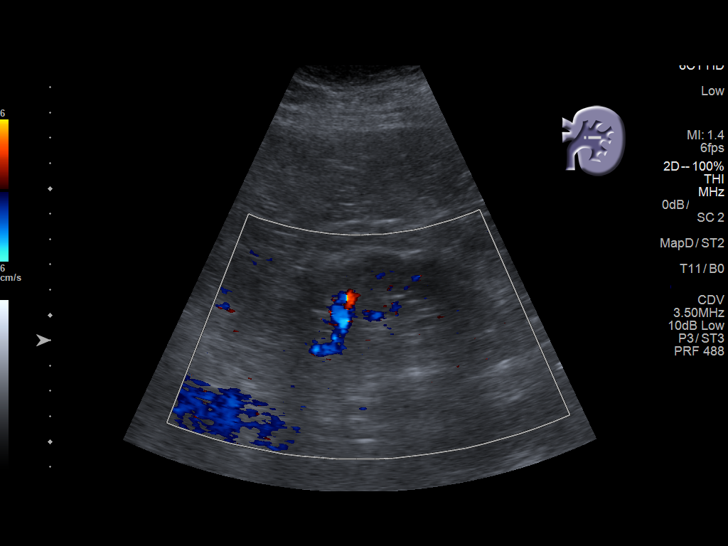
[im 21/26]
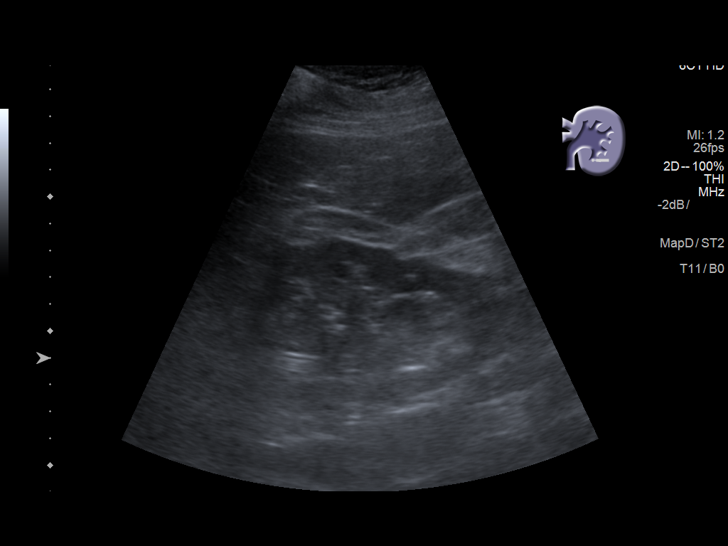
[im 23/26]
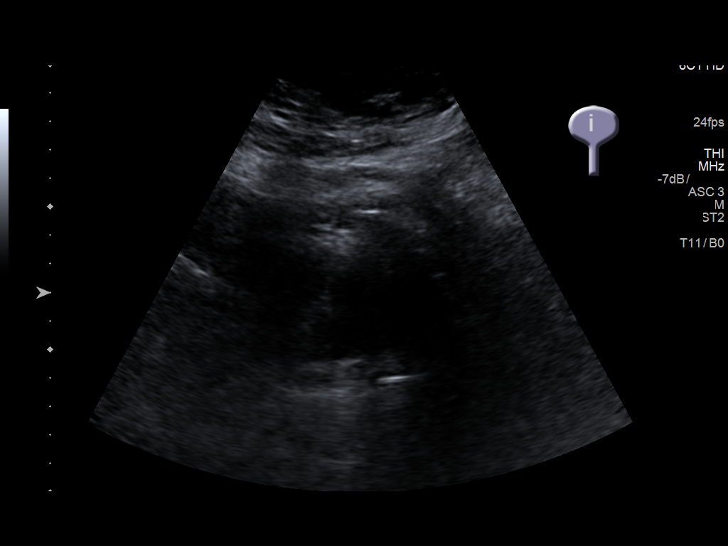
[im 26/26]
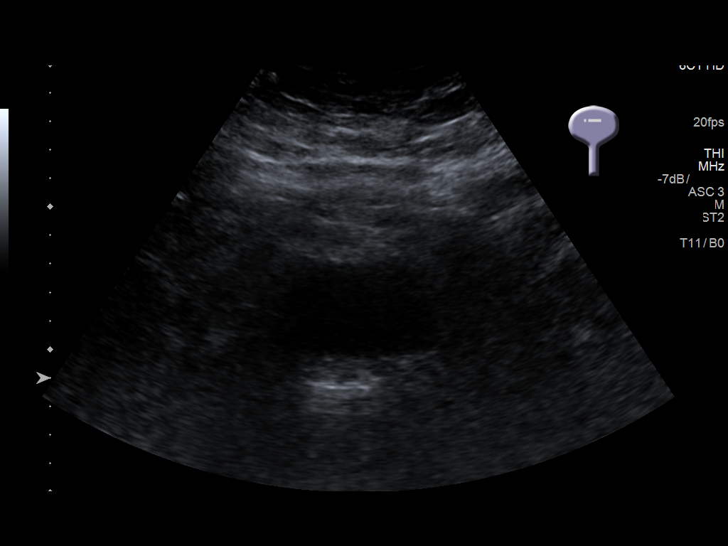

[14 of 25 positions shown; findings below may reference images not displayed]

FINDINGS: Right Kidney:

Length: 11.2 centimeters. Nodular right renal contour
re-demonstrated. Some cortical volume loss suspected. Echogenicity
within normal limits. No mass or hydronephrosis visualized.

Left Kidney:

Length: 10.8 centimeters. Nodular contour re-demonstrated with some
cortical volume loss suspected. Left midpole nephrolithiasis
re-demonstrated on image 24. Echogenicity within normal limits. No
mass or hydronephrosis visualized.

Bladder:

Diminutive bladder. Appears normal for degree of bladder distention.
IMPRESSION: 1. No acute renal or urinary bladder findings.
2. Chronic left nephrolithiasis, and a degree of chronic renal
cortical atrophy is suspected.

## 2019-07-24 DIAGNOSIS — E1142 Type 2 diabetes mellitus with diabetic polyneuropathy: Secondary | ICD-10-CM | POA: Diagnosis not present

## 2019-07-24 DIAGNOSIS — M216X2 Other acquired deformities of left foot: Secondary | ICD-10-CM | POA: Diagnosis not present

## 2019-07-24 DIAGNOSIS — L97522 Non-pressure chronic ulcer of other part of left foot with fat layer exposed: Secondary | ICD-10-CM | POA: Diagnosis not present

## 2019-07-25 ENCOUNTER — Ambulatory Visit: Payer: PPO

## 2019-07-28 ENCOUNTER — Ambulatory Visit: Payer: PPO

## 2019-07-29 ENCOUNTER — Ambulatory Visit: Payer: PPO | Attending: Internal Medicine

## 2019-07-29 DIAGNOSIS — Z23 Encounter for immunization: Secondary | ICD-10-CM

## 2019-07-29 NOTE — Progress Notes (Signed)
   Covid-19 Vaccination Clinic  Name:  [name]    MRN: [id number] DOB: May 16, 1950  07/29/2019  [name] was observed post Covid-19 immunization for 15 minutes without incident. She was provided with Vaccine Information Sheet and instruction to access the V-Safe system.   [name] was instructed to call 911 with any severe reactions post vaccine: Marland Kitchen Difficulty breathing  . Swelling of face and throat  . A fast heartbeat  . A bad rash all over body  . Dizziness and weakness   Immunizations Administered    Name Date Dose VIS Date Route   Pfizer COVID-19 Vaccine 07/29/2019 11:47 AM 0.3 mL 05/17/2018 Intramuscular   Manufacturer: [place]   Lot: [id number]   [place]: [id number]

## 2019-08-02 ENCOUNTER — Ambulatory Visit: Payer: PPO | Admitting: Pulmonary Disease

## 2019-08-04 ENCOUNTER — Other Ambulatory Visit: Payer: Self-pay | Admitting: Urology

## 2019-08-08 DIAGNOSIS — M216X2 Other acquired deformities of left foot: Secondary | ICD-10-CM | POA: Diagnosis not present

## 2019-08-08 DIAGNOSIS — L97522 Non-pressure chronic ulcer of other part of left foot with fat layer exposed: Secondary | ICD-10-CM | POA: Diagnosis not present

## 2019-08-08 DIAGNOSIS — E119 Type 2 diabetes mellitus without complications: Secondary | ICD-10-CM | POA: Diagnosis not present

## 2019-08-08 DIAGNOSIS — E1142 Type 2 diabetes mellitus with diabetic polyneuropathy: Secondary | ICD-10-CM | POA: Diagnosis not present

## 2019-08-11 IMAGING — CR DG HIP (WITH OR WITHOUT PELVIS) 2-3V*R*
3 series · 3 of 3 positions shown · non-contrast
Comparison: None.

CLINICAL DATA: Right posterior hip pain extending down the right
thigh.

EXAM:
DG HIP (WITH OR WITHOUT PELVIS) 2-3V RIGHT

[pelvis ap]
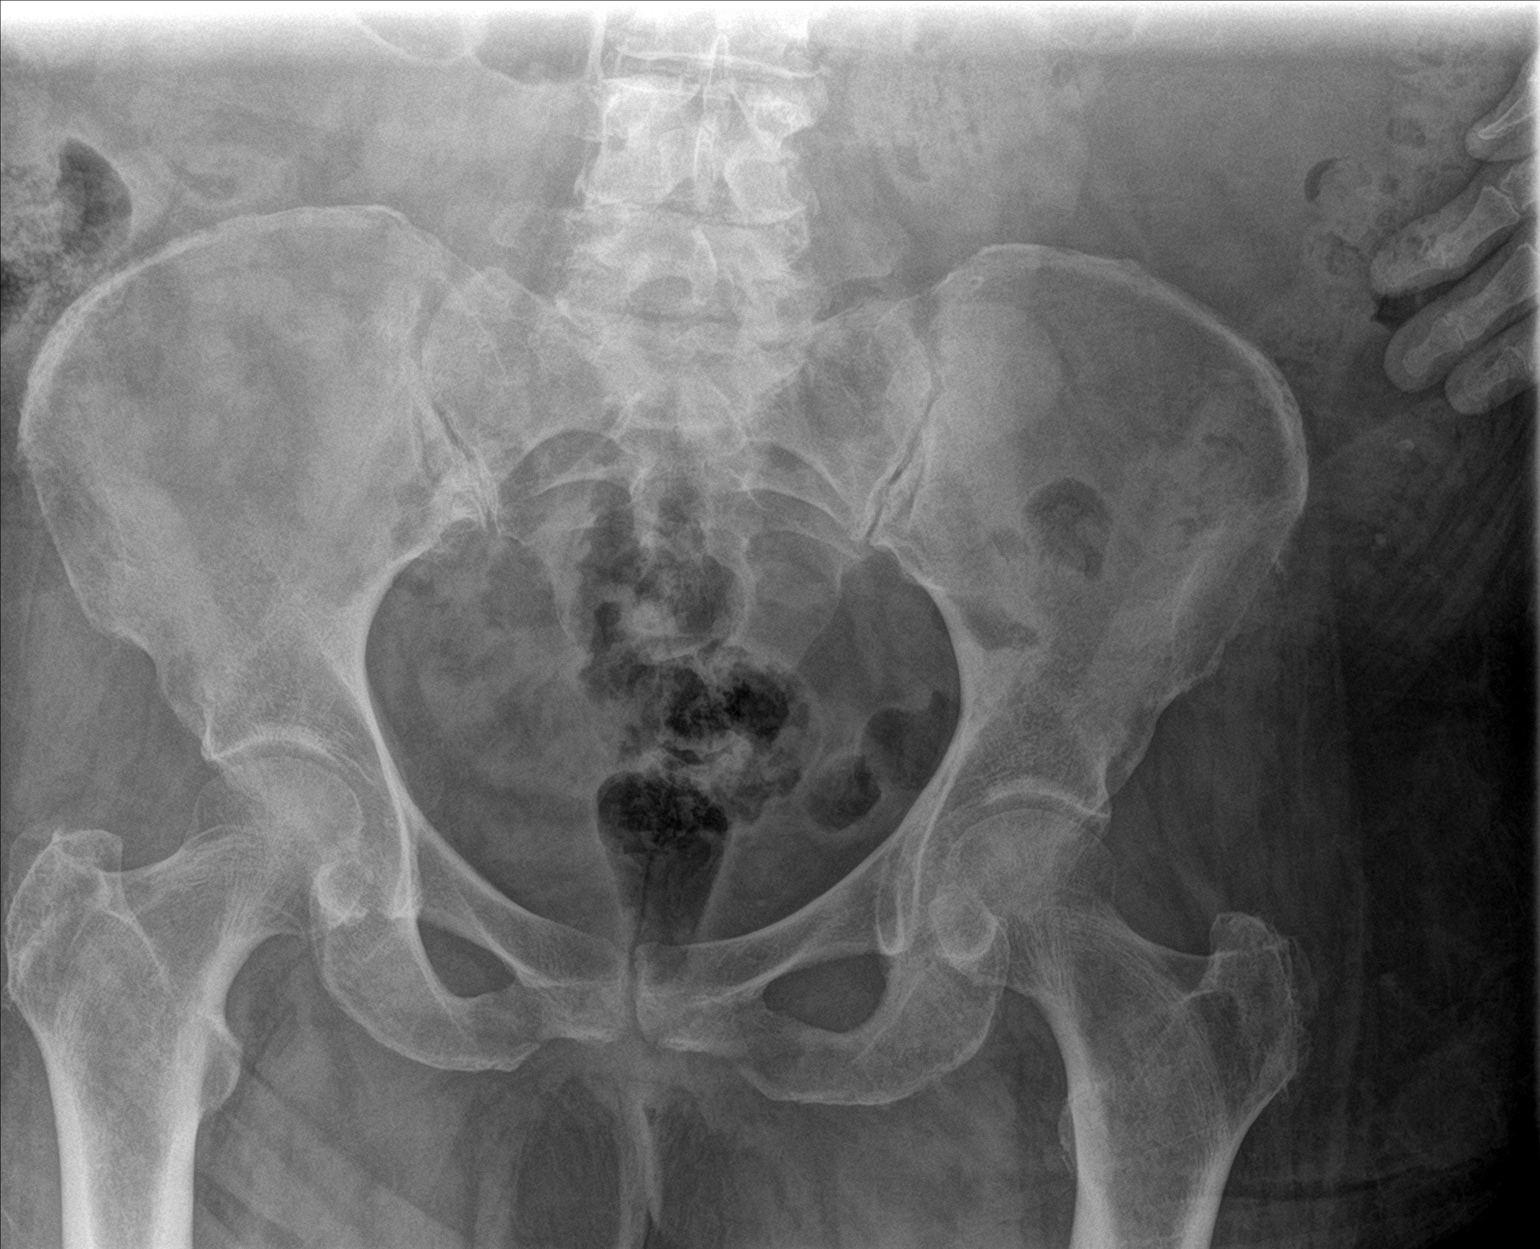

[hip ap]
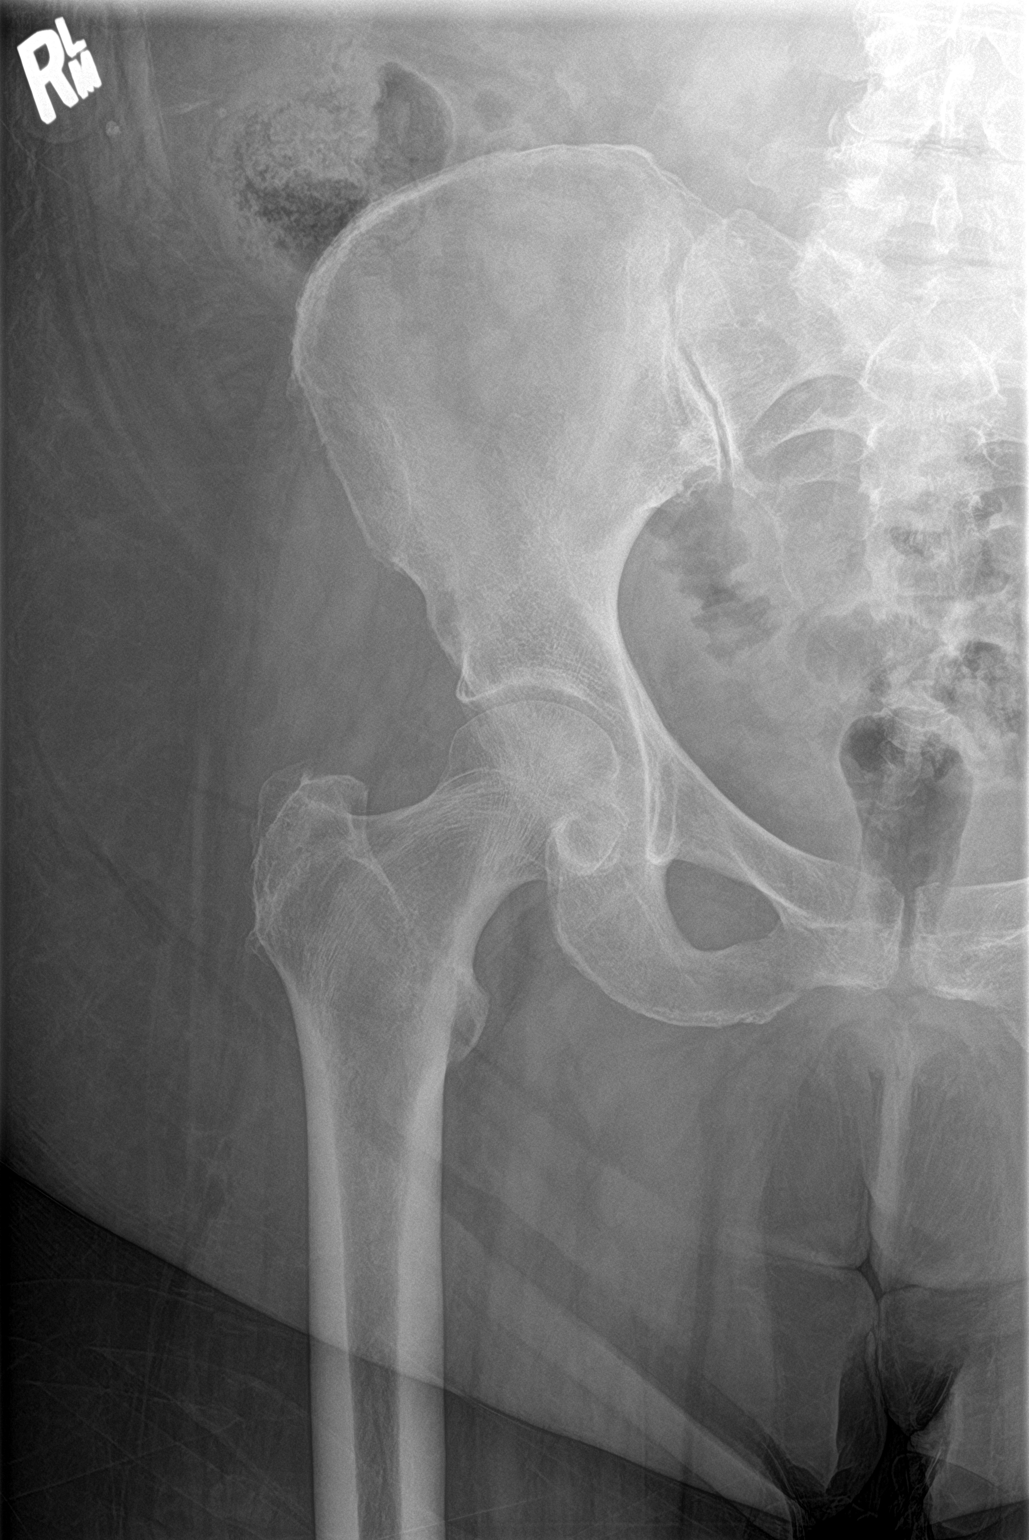

[hip lat]
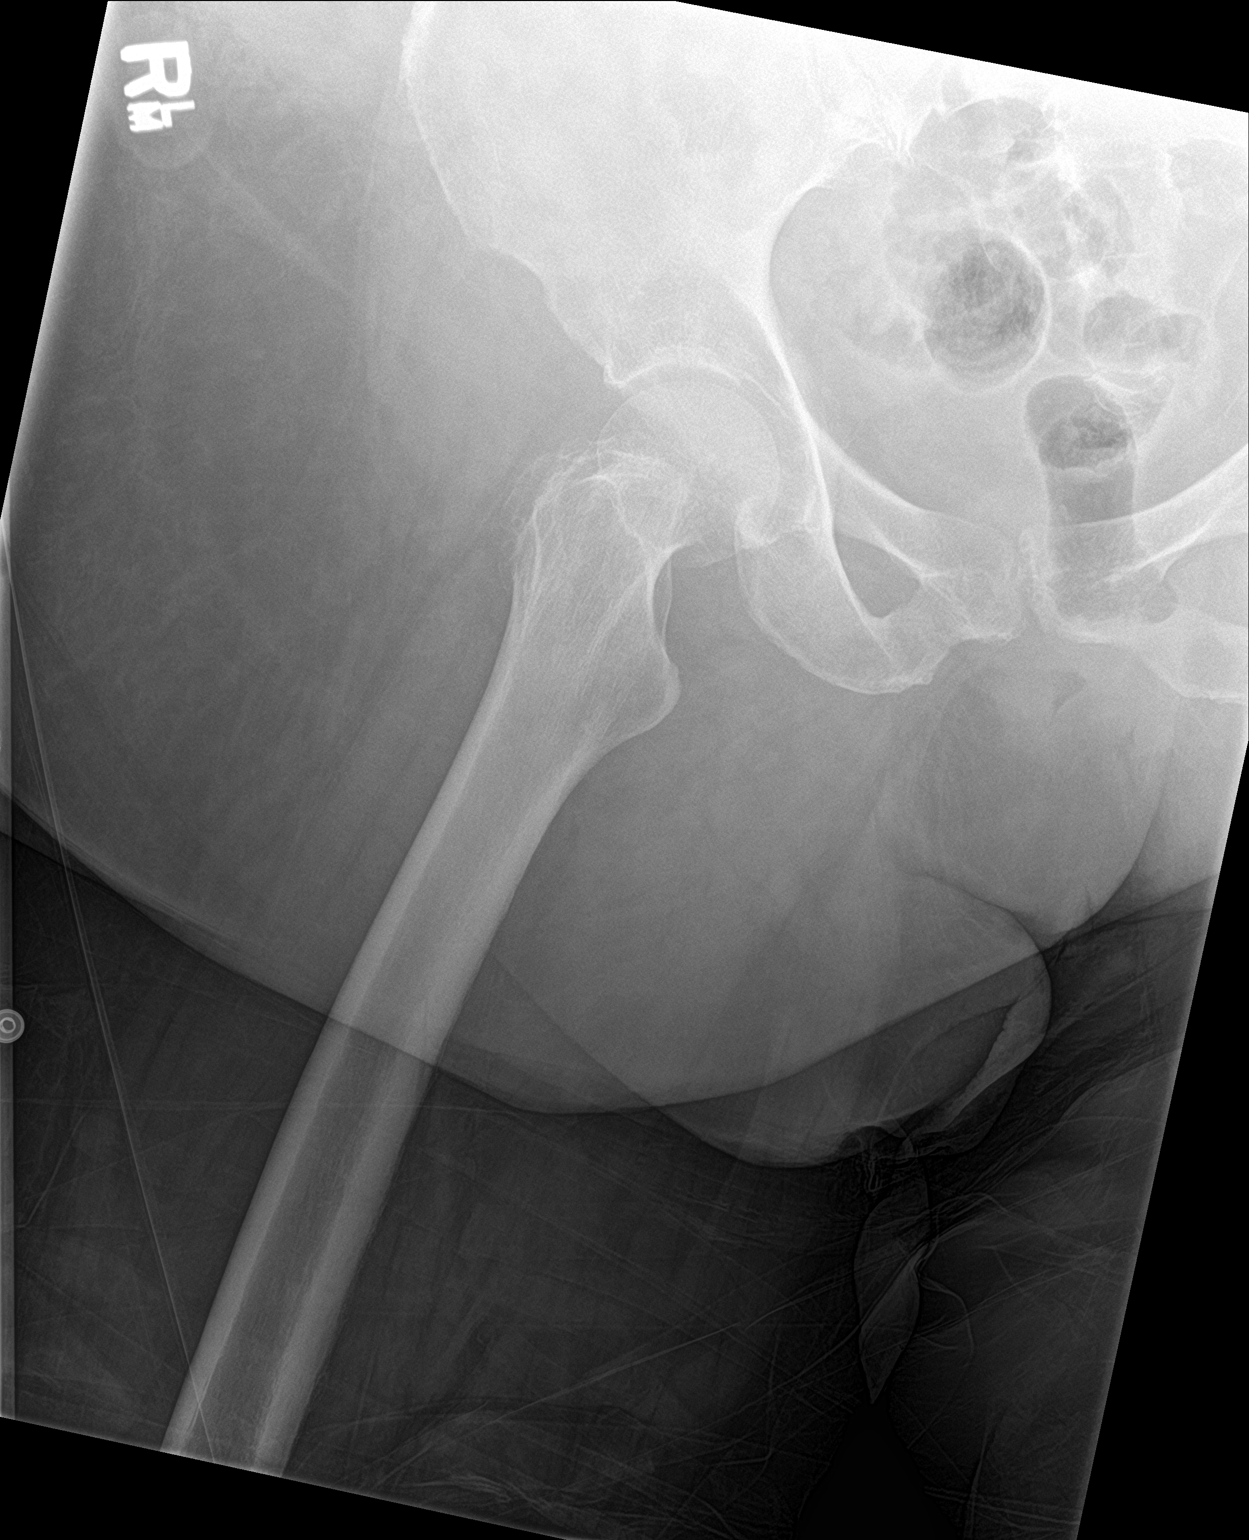

[3 of 3 positions shown; findings below may reference images not displayed]

FINDINGS: There is no evidence of hip fracture or dislocation. There is no
evidence of arthropathy or other focal bone abnormality.
IMPRESSION: No acute osseous injury of the right hip.

## 2019-08-11 IMAGING — CR DG SI JOINTS 3+V
3 series · 3 of 3 positions shown · non-contrast
Comparison: CT pelvis of 04/19/2016

CLINICAL DATA: Fell 2 months ago with right posterior hip pain
extending into the right thigh

EXAM:
BILATERAL SACROILIAC JOINTS - 3+ VIEW

[si joints ap]
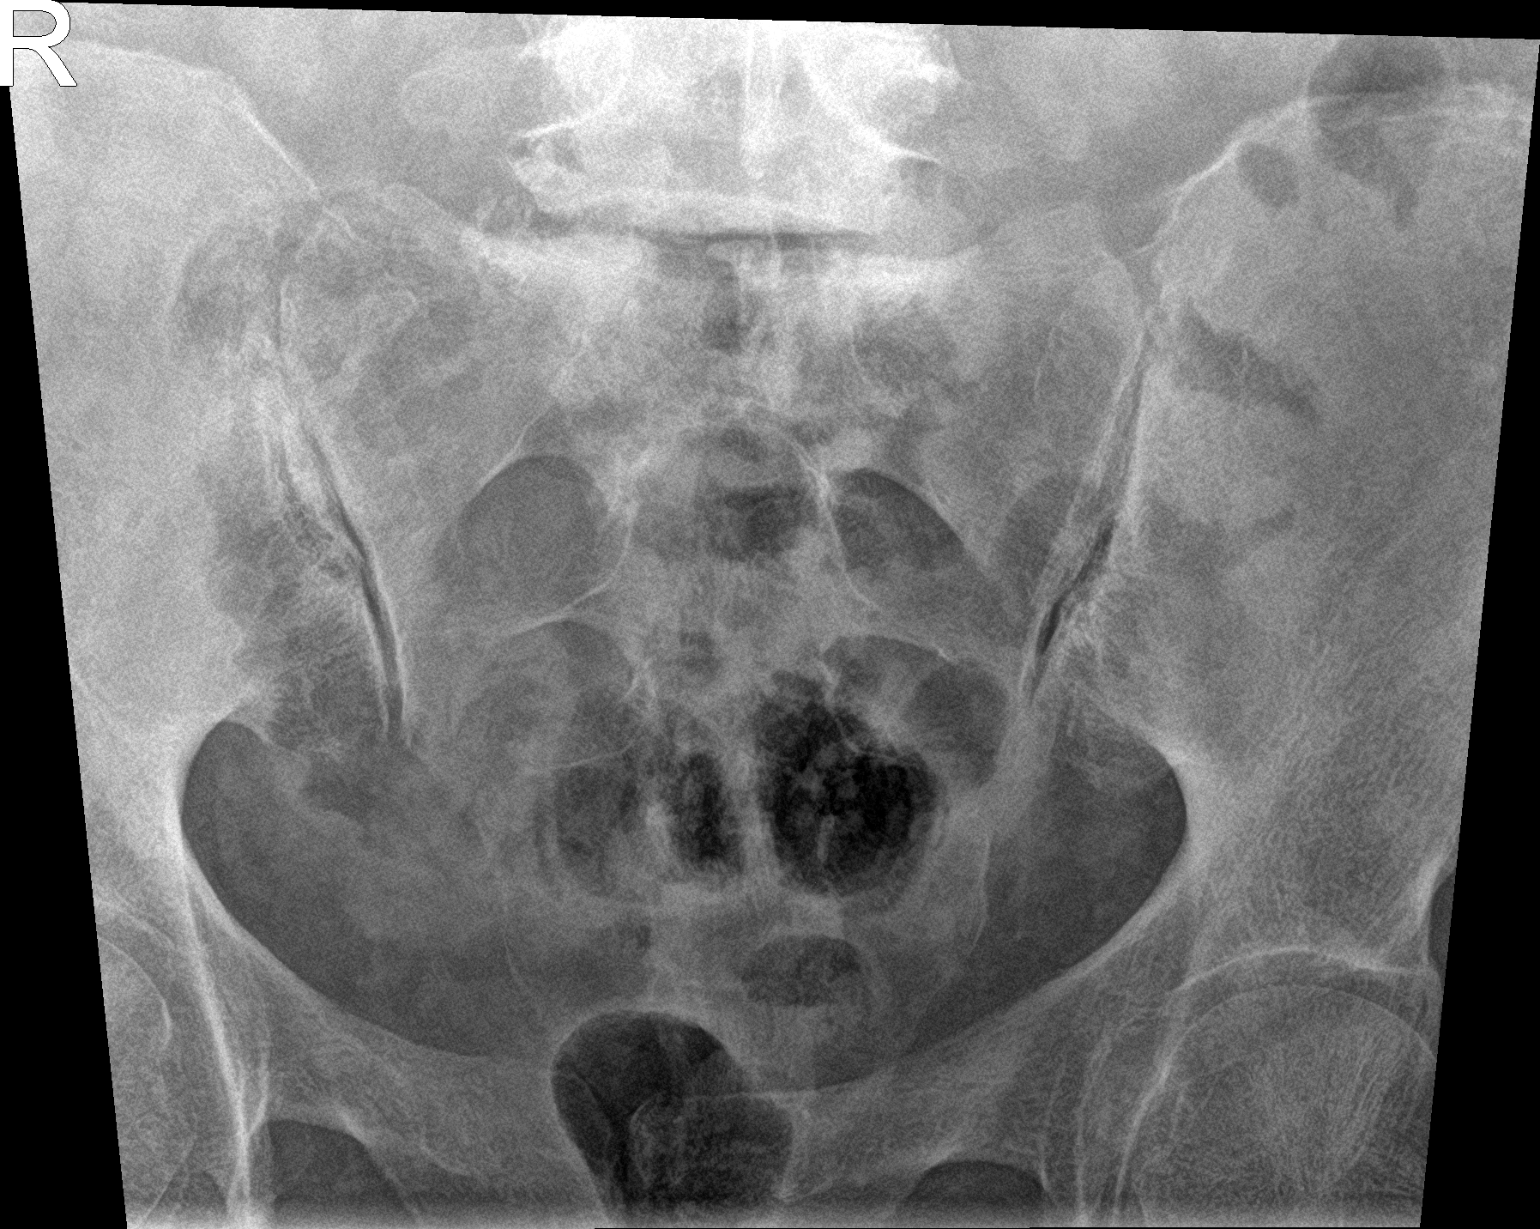

[si joints obl (1 of 2)]
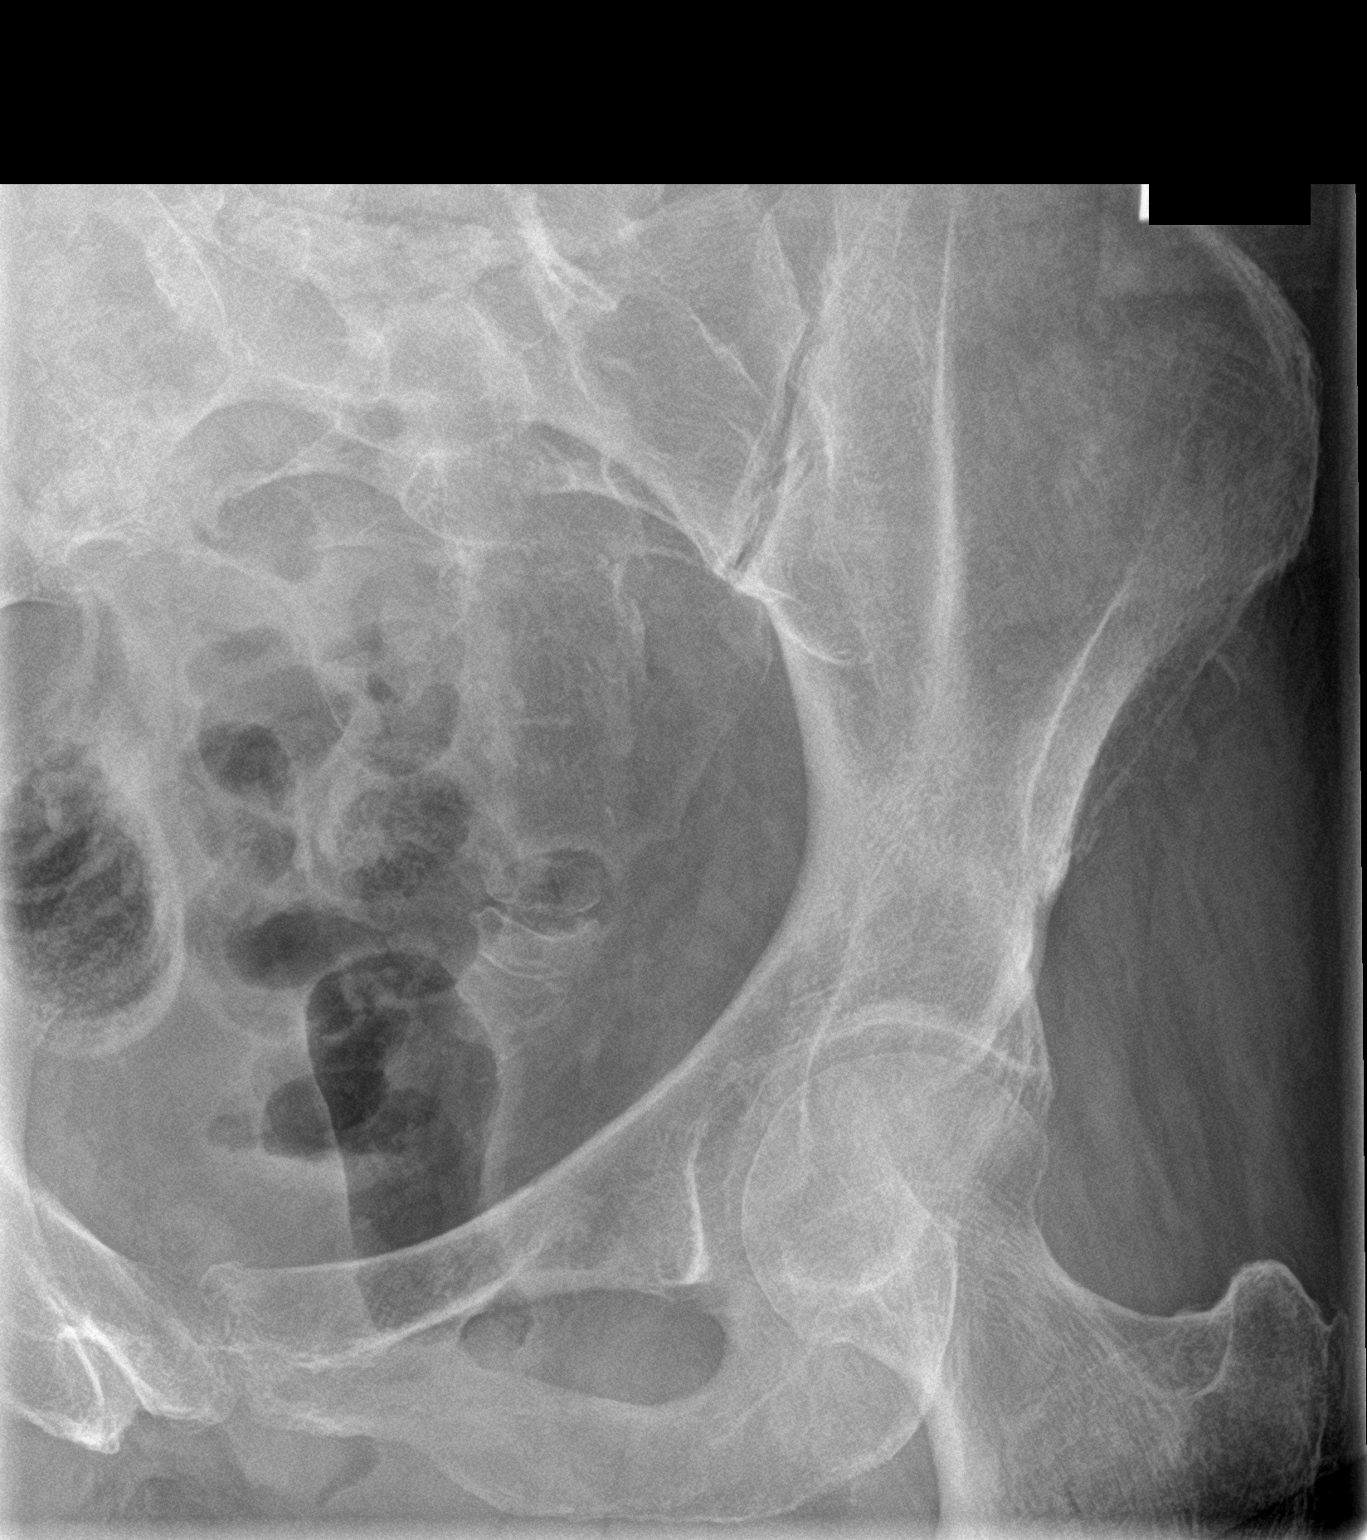

[si joints obl (2 of 2)]
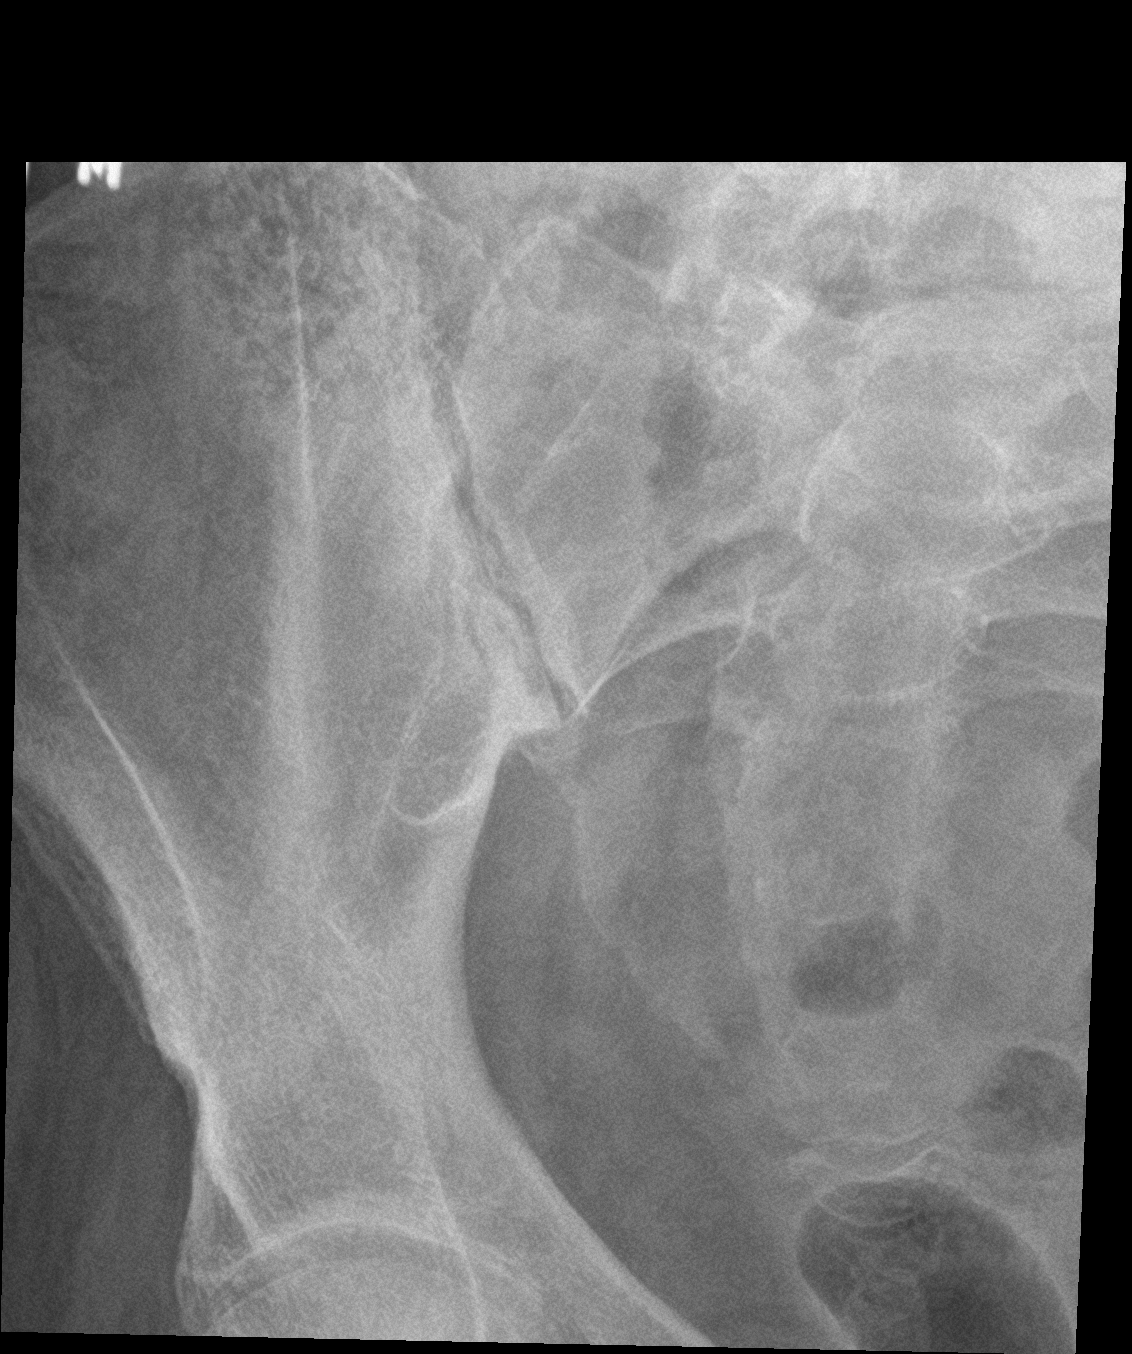

[3 of 3 positions shown; findings below may reference images not displayed]

FINDINGS: There is mild degenerative change of the SI joints with vacuum disc
phenomenon present. No acute fracture is seen. The sacral foramina
appear corticated. There is degenerative disc disease at the L5-S1
level with sclerosis and spurring present.
IMPRESSION: 1. Degenerative change of the SI joints.
2. Degenerative change at L5-S1.
3. No fracture.

## 2019-08-14 ENCOUNTER — Other Ambulatory Visit: Payer: Self-pay | Admitting: Internal Medicine

## 2019-08-14 DIAGNOSIS — Z1231 Encounter for screening mammogram for malignant neoplasm of breast: Secondary | ICD-10-CM

## 2019-08-15 DIAGNOSIS — D131 Benign neoplasm of stomach: Secondary | ICD-10-CM | POA: Diagnosis not present

## 2019-08-15 DIAGNOSIS — K224 Dyskinesia of esophagus: Secondary | ICD-10-CM | POA: Diagnosis not present

## 2019-08-15 DIAGNOSIS — L03115 Cellulitis of right lower limb: Secondary | ICD-10-CM | POA: Diagnosis not present

## 2019-08-15 DIAGNOSIS — K209 Esophagitis, unspecified without bleeding: Secondary | ICD-10-CM | POA: Diagnosis not present

## 2019-08-16 ENCOUNTER — Other Ambulatory Visit: Payer: Self-pay

## 2019-08-16 DIAGNOSIS — G4733 Obstructive sleep apnea (adult) (pediatric): Secondary | ICD-10-CM | POA: Diagnosis not present

## 2019-08-16 MED ORDER — ADVAIR HFA 115-21 MCG/ACT IN AERO: 2.0000 | INHALATION_SPRAY | Freq: Every day | RESPIRATORY_TRACT | 12 refills | Status: DC

## 2019-08-17 ENCOUNTER — Ambulatory Visit
Admission: RE | Admit: 2019-08-17 | Discharge: 2019-08-17 | Disposition: A | Discharge status: Home / Self Care | Payer: PPO | Source: Ambulatory Visit | Attending: Internal Medicine | Admitting: Internal Medicine

## 2019-08-17 ENCOUNTER — Other Ambulatory Visit: Payer: Self-pay | Admitting: Internal Medicine

## 2019-08-17 ENCOUNTER — Telehealth: Payer: Self-pay | Admitting: Internal Medicine

## 2019-08-17 DIAGNOSIS — Z1231 Encounter for screening mammogram for malignant neoplasm of breast: Secondary | ICD-10-CM | POA: Diagnosis not present

## 2019-08-17 DIAGNOSIS — M1990 Unspecified osteoarthritis, unspecified site: Secondary | ICD-10-CM

## 2019-08-17 MED ORDER — MELOXICAM 15 MG PO TABS: 15.0000 mg | ORAL_TABLET | Freq: Every day | ORAL | 0 refills | Status: DC | PRN

## 2019-08-17 NOTE — Telephone Encounter (Signed)
Patient calling in to inquire about toilet seat cover. Informed the patient that per our 07/14/19 conversation the orders were left upfront for her. She states that she will have her husband come and pick this up for her tomorrow.

## 2019-08-17 NOTE — Telephone Encounter (Signed)
Pt called stating that Dr.Tracy had spoke with her about a commode extension

## 2019-08-29 DIAGNOSIS — E1142 Type 2 diabetes mellitus with diabetic polyneuropathy: Secondary | ICD-10-CM | POA: Diagnosis not present

## 2019-08-29 DIAGNOSIS — L97522 Non-pressure chronic ulcer of other part of left foot with fat layer exposed: Secondary | ICD-10-CM | POA: Diagnosis not present

## 2019-08-29 DIAGNOSIS — M216X2 Other acquired deformities of left foot: Secondary | ICD-10-CM | POA: Diagnosis not present

## 2019-08-29 NOTE — Progress Notes (Signed)
Patient: [name]  Service Category: E/M  Provider: Gaspar Cola, MD  DOB: October 25, 1950  DOS: 08/30/2019  Location: Office  MRN: [id number]  Setting: Ambulatory outpatient  Referring Provider: [name], Olivia Mackie *  Type: Established Patient  Specialty: Interventional Pain Management  PCP: [name], [name], MD  Location: Remote location  Delivery: TeleHealth     Virtual Encounter - Pain Management PROVIDER NOTE: Information contained herein reflects review and annotations entered in association with encounter. Interpretation of such information and data should be left to medically-trained personnel. Information provided to patient can be located elsewhere in the medical record under "Patient Instructions". Document created using STT-dictation technology, any transcriptional errors that may result from process are unintentional.    Contact & Pharmacy Preferred: [phone] Home: [phone] (home) Mobile: [phone] (mobile) E-mail: [email or web address]  [place], [place] - [place] [address] Phone: [phone] Fax: [phone]   Pre-screening  [name] offered "in-person" vs "virtual" encounter. She indicated preferring virtual for this encounter.   Reason COVID-19*  Social distancing based on CDC and AMA recommendations.   I contacted [name] on 08/30/2019 via telephone.      I clearly identified myself as Gaspar Cola, MD. I verified that I was speaking with the correct person using two identifiers (Name: [name], and date of birth: 02-25-1951).  Consent I sought verbal advanced consent from [name] for virtual visit interactions. I informed [name] of possible security and privacy concerns, risks, and limitations associated with providing "not-in-person" medical evaluation and management services. I also informed [name] of the availability of "in-person"  appointments. Finally, I informed her that there would be a charge for the virtual visit and that she could be  personally, fully or partially, financially responsible for it. [name] expressed understanding and agreed to proceed.   Historic Elements   [name] is a 69 y.o. year old, adult patient evaluated today after her last contact with our practice on 07/04/2019. [name]  has a past medical history of Abnormal antibody titer, Anxiety and depression, Arthritis, Asthma, Cystocele, Depression, Diabetes ([place]), DVT (deep venous thrombosis) ([place]), Dyspnea, Dysrhythmia, Edema, GERD (gastroesophageal reflux disease), Heart murmur, History of IBS, History of kidney stones, History of methicillin resistant staphylococcus aureus (MRSA) (2008), Hyperlipidemia, Hypertension, Hypothyroidism, Insomnia, Kidney stone, Neuropathy involving both lower extremities, Obesity, Class II, BMI 35-39.9, with comorbidity, Paroxysmal atrial fibrillation ([place]) (2007), Peripheral vascular disease ([place]), Sleep apnea, Urinary incontinence, and Venous stasis dermatitis of both lower extremities. She also  has a past surgical history that includes Ankle surgery (Right); Vaginal hysterectomy; Sleeve Gastroplasty; Lithotripsy; Kidney surgery (Right); transthoracic echocardiogram (08/03/2013); transesophageal echocardiogram (08/08/2013); Tonsillectomy; Total knee arthroplasty (Left); Hiatal hernia repair; Cholecystectomy; I & D extremity (Left, 06/27/2015); Application if wound vac (Left, 06/27/2015); removal of left hematoma (Left); NM GATED MYOVIEW [facility] HX) (February 2017); Reverse shoulder arthroplasty (Right, 10/08/2015); Hernia repair; Ulnar nerve transposition (Right, 08/06/2016); arm surgery; Irrigation and debridement abscess (Left, 09/07/2016); Incision and drainage of wound (Left, 09/25/2016); Application if wound vac (Left, 09/25/2016); Colonoscopy; Upper gi endoscopy; Esophagogastroduodenoscopy (egd) with propofol (N/A,  01/11/2017); Colonoscopy with propofol (N/A, 01/11/2017); Colonoscopy with propofol (N/A, 02/22/2017); OTHER SURGICAL HISTORY; Total shoulder replacement (Right); Joint replacement (2013); Colonoscopy with propofol (N/A, 05/31/2017); Lower Extremity Angiography (Left, 04/04/2018); Esophagogastroduodenoscopy (egd) with propofol (N/A, 10/25/2018); and Incision and drainage of wound (Left, 11/09/2018). [name] has a current medication list which includes  the following prescription(s): albuterol, albuterol, alprazolam, amlodipine, bupropion, cetirizine, clobetasol cream, colchicine, dexilant, duloxetine, ergocalciferol, estradiol, advair hfa, hydrochlorothiazide, hydrocodone-acetaminophen, levocetirizine, levothyroxine, losartan-hydrochlorothiazide, [START ON 09/13/2019] meloxicam, metformin, metoprolol succinate, montelukast, mupirocin ointment, nystatin cream, ondansetron, oxybutynin, pantoprazole, pradaxa, pregabalin, flutter, toviaz, and zinc oxide, and the following Facility-Administered Medications: ipratropium-albuterol. She  reports that she has never smoked. She has never used smokeless tobacco. She reports that she does not drink alcohol or use drugs. [name] is allergic to morphine and related; penicillins; aspirin; and oxytetracycline.   HPI  Today, she is being contacted for medication management. The patient indicates doing well with the current medication regimen. No adverse reactions or side effects reported to the medications.  Today the patient demonstrated some shortness of breath and wheezing while on the phone.  The patient was instructed to follow-up with her primary care physician for this.  Other than that, she reported that she was told that her foot was improving and she was excited about this.  I have notified the patient of the need for a UDS.  I have also informed her that her next refill will be a face-to-face evaluation in the clinic as we are resuming normal scheduling.  She  understood and accepted.  Pharmacotherapy Assessment  Analgesic: Hydrocodone/APAP 5/325 mg, 1 tab PO q 8 hours (15 mg/day of hydrocodone) MME/day:15 mg/day.   Monitoring: [place] PMP: PDMP reviewed during this encounter.       Pharmacotherapy: No side-effects or adverse reactions reported. Compliance: No problems identified. Effectiveness: Clinically acceptable. Plan: Refer to "POC".  UDS:  Summary  Date Value Ref Range Status  05/19/2018 FINAL  Final    Comment:    ==================================================================== TOXASSURE SELECT 13 (MW) ==================================================================== Test                             Result       Flag       Units Drug Present and Declared for Prescription Verification   Alprazolam                     111          EXPECTED   ng/mg creat   Alpha-hydroxyalprazolam        162          EXPECTED   ng/mg creat    Source of alprazolam is a scheduled prescription medication.    Alpha-hydroxyalprazolam is an expected metabolite of alprazolam.   Hydrocodone                    560          EXPECTED   ng/mg creat   Hydromorphone                  30           EXPECTED   ng/mg creat   Dihydrocodeine                 86           EXPECTED   ng/mg creat   Norhydrocodone                 943          EXPECTED   ng/mg creat    Sources of hydrocodone include scheduled prescription    medications. Hydromorphone, dihydrocodeine and norhydrocodone are    expected metabolites of hydrocodone. Hydromorphone and  dihydrocodeine are also available as scheduled prescription    medications. ==================================================================== Test                      Result    Flag   Units      Ref Range   Creatinine              206              mg/dL      >=20 ==================================================================== Declared Medications:  The flagging and interpretation on this report are based on the   following declared medications.  Unexpected results may arise from  inaccuracies in the declared medications.  **Note: The testing scope of this panel includes these medications:  Alprazolam  Hydrocodone (Hydrocodone-Acetaminophen)  **Note: The testing scope of this panel does not include following  reported medications:  Acetaminophen (Hydrocodone-Acetaminophen)  Albuterol  Amlodipine Besylate  Bupropion  Calcium citrate (Calcium citrate/Vitamin D)  Clobetasol  Dabigatran  Dexlansoprazole  Duloxetine  Estradiol  Fesoterodine  Fluticasone (Advair)  Gabapentin  Hydrochlorothiazide  Levocetirizine  Levothyroxine  Losartan (Losartan Potassium)  Metformin  Metoprolol  Montelukast  Mupirocin  Nystatin  Ondansetron  Oxybutynin  Pregabalin  Salmeterol (Advair)  Tiotropium  Topical Diclofenac  Vitamin D (Calcium citrate/Vitamin D)  Zinc Oxide ==================================================================== For clinical consultation, please call [phone]. ====================================================================     Laboratory Chemistry Profile   Renal Lab Results  Component Value Date   BUN 21 11/09/2018   CREATININE 1.22 (H) 11/09/2018   BCR 20 10/10/2014   GFR 49.29 (L) 05/12/2018   GFRAA 52 (L) 11/09/2018   GFRNONAA 45 (L) 11/09/2018     Hepatic Lab Results  Component Value Date   AST 18 09/28/2018   ALT 13 09/28/2018   ALBUMIN 3.7 09/28/2018   ALKPHOS 84 09/28/2018   LIPASE 27 07/28/2018     Electrolytes Lab Results  Component Value Date   NA 140 11/09/2018   K 4.6 11/09/2018   CL 105 11/09/2018   CALCIUM 9.2 11/09/2018   MG 1.8 04/17/2015   PHOS 3.4 08/12/2012     Bone Lab Results  Component Value Date   VD25OH 32.15 05/10/2017     Inflammation (CRP: Acute Phase) (ESR: Chronic Phase) Lab Results  Component Value Date   CRP 14.7 (H) 09/08/2016   ESRSEDRATE 58 (H) 09/08/2016   LATICACIDVEN 1.5 09/28/2018        Note: Above Lab results reviewed.  Imaging  MM 3D SCREEN BREAST BILATERAL CLINICAL DATA:  Screening.  EXAM: DIGITAL SCREENING BILATERAL MAMMOGRAM WITH TOMO AND CAD  COMPARISON:  Previous exam(s).  ACR Breast Density Category b: There are scattered areas of fibroglandular density.  FINDINGS: There are no findings suspicious for malignancy. Images were processed with CAD.  IMPRESSION: No mammographic evidence of malignancy. A result letter of this screening mammogram will be mailed directly to the patient.  RECOMMENDATION: Screening mammogram in one year. (Code:[id number]-B-01Y)  BI-RADS CATEGORY  1: Negative.  Electronically Signed   By: Audie Pinto M.D.   On: 08/17/2019 16:35  Assessment  The primary encounter diagnosis was Chronic pain syndrome. Diagnoses of Diabetic peripheral neuropathy associated with type 2 diabetes mellitus ([place]), Pharmacologic therapy, Fibromyalgia, and Chronic musculoskeletal pain were also pertinent to this visit.  Plan of Care  Problem-specific:  No problem-specific Assessment & Plan notes found for this encounter.  Ms. ADAIA MATTHIES has a current medication list which includes the following long-term medication(s): albuterol, albuterol, amlodipine, bupropion,  colchicine, dexilant, duloxetine, advair hfa, hydrochlorothiazide, hydrocodone-acetaminophen, levocetirizine, levothyroxine, losartan-hydrochlorothiazide, metformin, metoprolol succinate, montelukast, pradaxa, and pregabalin.  Pharmacotherapy (Medications Ordered): No orders of the defined types were placed in this encounter.  Orders:  No orders of the defined types were placed in this encounter.  Follow-up plan:   Return in about 13 weeks (around 11/29/2019) for (F2F), (MM).      Interventional therapies: Planned, scheduled, and/or pending:   Not at this time. (Always stop Pradaxa x 5 days prior to any blocks.)   Considering:   NOTE: PRADAXA ANTICOAGULATION (Stop: 5 days   Restart: 6 hours) NO LUMBAR RFA until BMI < 35. Diagnostic right suprascapular NB Diagnostic right IA knee injection  Possible series of 5 Hyalgan knee injections Diagnostic right-sided genicular NB Possible right-sided genicular nerve RFA  Diagnostic right-sided suprascapular NB  Possible right-sided suprascapular nerve RFA    Palliative PRN treatment(s):   Palliative right IA shoulder joint injection  Palliative right lateral femoral cutaneous NB Palliative right lateral femoral continuous nerve RFA #2        Recent Visits No visits were found meeting these conditions.  Showing recent visits within past 90 days and meeting all other requirements   Today's Visits Date Type Provider Dept  08/30/19 Telemedicine Milinda Pointer, MD [facility]  Showing today's visits and meeting all other requirements   Future Appointments No visits were found meeting these conditions.  Showing future appointments within next 90 days and meeting all other requirements   I discussed the assessment and treatment plan with the patient. The patient was provided an opportunity to ask questions and all were answered. The patient agreed with the plan and demonstrated an understanding of the instructions.  Patient advised to call back or seek an in-person evaluation if the symptoms or condition worsens.  Duration of encounter: 15 minutes.  Note by: Gaspar Cola, MD Date: 08/30/2019; Time: 7:31 AM

## 2019-08-30 ENCOUNTER — Other Ambulatory Visit: Payer: Self-pay

## 2019-08-30 ENCOUNTER — Ambulatory Visit: Payer: PPO | Attending: Pain Medicine | Admitting: Pain Medicine

## 2019-08-30 DIAGNOSIS — G8929 Other chronic pain: Secondary | ICD-10-CM | POA: Diagnosis not present

## 2019-08-30 DIAGNOSIS — M797 Fibromyalgia: Secondary | ICD-10-CM

## 2019-08-30 DIAGNOSIS — E1142 Type 2 diabetes mellitus with diabetic polyneuropathy: Secondary | ICD-10-CM | POA: Diagnosis not present

## 2019-08-30 DIAGNOSIS — Z79899 Other long term (current) drug therapy: Secondary | ICD-10-CM | POA: Diagnosis not present

## 2019-08-30 DIAGNOSIS — G894 Chronic pain syndrome: Secondary | ICD-10-CM | POA: Diagnosis not present

## 2019-08-30 DIAGNOSIS — M7918 Myalgia, other site: Secondary | ICD-10-CM | POA: Diagnosis not present

## 2019-08-30 MED ORDER — HYDROCODONE-ACETAMINOPHEN 5-325 MG PO TABS: 1.0000 | ORAL_TABLET | Freq: Three times a day (TID) | ORAL | 0 refills | Status: DC | PRN

## 2019-08-30 MED ORDER — DULOXETINE HCL 60 MG PO CPEP: 60.0000 mg | ORAL_CAPSULE | Freq: Every day | ORAL | 5 refills | Status: DC

## 2019-08-30 MED ORDER — PREGABALIN 50 MG PO CAPS: 50.0000 mg | ORAL_CAPSULE | Freq: Three times a day (TID) | ORAL | 5 refills | Status: DC

## 2019-09-01 ENCOUNTER — Ambulatory Visit: Payer: PPO | Admitting: Physician Assistant

## 2019-09-01 ENCOUNTER — Other Ambulatory Visit: Payer: Self-pay

## 2019-09-01 ENCOUNTER — Encounter: Payer: Self-pay | Admitting: Physician Assistant

## 2019-09-01 VITALS — BP 138/86 | HR 80 | Ht 65.0 in | Wt 252.0 lb

## 2019-09-01 DIAGNOSIS — R300 Dysuria: Secondary | ICD-10-CM

## 2019-09-01 DIAGNOSIS — N3946 Mixed incontinence: Secondary | ICD-10-CM

## 2019-09-01 LAB — MICROSCOPIC EXAMINATION
RBC, Urine: NONE SEEN /hpf (ref 0–2)
WBC, UA: >30 /hpf — ABNORMAL HIGH (ref 0–5)

## 2019-09-01 LAB — URINALYSIS, COMPLETE
Bilirubin, UA: NEGATIVE
Glucose, UA: NEGATIVE
Nitrite, UA: POSITIVE — AB
RBC, UA: NEGATIVE
Specific Gravity, UA: 1.025 (ref 1.005–1.030)
Urobilinogen, Ur: 0.2 mg/dL (ref 0.2–1.0)
pH, UA: 5.5 (ref 5.0–7.5)

## 2019-09-01 LAB — BLADDER SCAN AMB NON-IMAGING: Scan Result: 44

## 2019-09-01 MED ORDER — TROSPIUM CHLORIDE 20 MG PO TABS: 20.0000 mg | ORAL_TABLET | Freq: Two times a day (BID) | ORAL | 2 refills | Status: DC

## 2019-09-01 NOTE — Patient Instructions (Signed)
Stop oxybutynin; I would like to switch you to trospium twice daily instead. I have sent a prescription to [facility] for this. You may continue Toviaz.

## 2019-09-01 NOTE — Progress Notes (Signed)
09/01/2019 4:13 PM   [name] [birth year] [id number]  CC: Chief Complaint  Patient presents with  . Urinary Incontinence   HPI: [name] is a 69 y.o. female with PMH urinary colonization with multidrug-resistant fecal organisms previously recommended to start topical vaginal estrogen cream, cranberry supplements, probiotics, increased water intake, and Kegel exercises per ID who presents today for evaluation of worsened urinary incontinence.  Patient has a long history of mixed incontinence managed by Dr. Matilde Sprang, having previously failed Myrbetriq.  She is currently taking oxybutynin and Toviaz for management of her urinary symptoms and was due to follow-up with him approximately 6 months ago.  Today, she reports a 2-week history of worsened urinary incontinence overnight.  She typically wears absorbent underwear to sleep, however she states she is suddenly leaking through these absorbent underwear and soaking her underpads.  She denies increased BLE edema, swelling, and weight changes. She does report some shortness of breath over the past several weeks, however she saw cardiology since symptom onset with no prescribed changes in her medications.  She remains CPAP compliant.  She continues to use topical vaginal estrogen cream and occasional cranberry juice.  She takes probiotics irregularly.  She reports having completed pelvic PT approximately 2 years ago and says that this was not helpful for management of her urinary symptoms.  She denies dysuria.  In-office UA today positive for 1+ protein, nitrites, and 1+ leukocyte esterase; urine microscopy with >30 WBCs/HPF and many bacteria. PVR 67mL.  PMH: Past Medical History:  Diagnosis Date  . Abnormal antibody titer   . Anxiety and depression   . Arthritis   . Asthma   . Cystocele   . Depression   . Diabetes ([place])   . DVT (deep venous thrombosis) (HCC)    Righ calf  . Dyspnea    11/08/2018 - "more now due to lack  of exercise"  . Dysrhythmia    afib  . Edema   . GERD (gastroesophageal reflux disease)   . Heart murmur    Echocardiogram June 2015: Mild MR (possible vegetation seen on TTE, not seen on TEE), normal LV size with moderate concentric LVH. Normal function EF 60-65%. Normal diastolic function. Mild LA dilation.  Marland Kitchen History of IBS   . History of kidney stones   . History of methicillin resistant staphylococcus aureus (MRSA) 2008  . Hyperlipidemia   . Hypertension   . Hypothyroidism   . Insomnia   . Kidney stone    kidney stones with lithotripsy .  [facility]- "adrenal glands"  . Neuropathy involving both lower extremities   . Obesity, Class II, BMI 35-39.9, with comorbidity   . Paroxysmal atrial fibrillation ([place]) 2007   In June 2015, Cardiac Event Monitor: Mostly SR/sinus arrhythmia with PVCs that are frequent. Short bursts of A. fib lasting several minutes;; CHA2DS2-VASc Score = 5 (age, Female, PVD, DM, HTN)  . Peripheral vascular disease ([place])   . Sleep apnea    use C-PAP  . Urinary incontinence   . Venous stasis dermatitis of both lower extremities     Surgical History: Past Surgical History:  Procedure Laterality Date  . ANKLE SURGERY Right   . APPLICATION OF WOUND VAC Left 06/27/2015   Procedure: APPLICATION OF WOUND VAC ( POSSIBLE ) ;  Surgeon: [name], MD;  Location: [facility] ORS;  Service: Vascular;  Laterality: Left;  . APPLICATION OF WOUND VAC Left 09/25/2016   Procedure: APPLICATION OF WOUND VAC;  Surgeon: [name], DPM;  Location: [facility]S;  Service: Podiatry;  Laterality: Left;  . arm surgery     right    . CHOLECYSTECTOMY    . COLONOSCOPY    . COLONOSCOPY WITH PROPOFOL N/A 01/11/2017   Procedure: COLONOSCOPY WITH PROPOFOL;  Surgeon: [name], MD;  Location: [facility] ENDOSCOPY;  Service: Endoscopy;  Laterality: N/A;  . COLONOSCOPY WITH PROPOFOL N/A 02/22/2017   Procedure: COLONOSCOPY WITH PROPOFOL;  Surgeon: [name], MD;  Location: [facility]  ENDOSCOPY;  Service: Endoscopy;  Laterality: N/A;  . COLONOSCOPY WITH PROPOFOL N/A 05/31/2017   Procedure: COLONOSCOPY WITH PROPOFOL;  Surgeon: [name], MD;  Location: [facility] ENDOSCOPY;  Service: Endoscopy;  Laterality: N/A;  . ESOPHAGOGASTRODUODENOSCOPY (EGD) WITH PROPOFOL N/A 01/11/2017   Procedure: ESOPHAGOGASTRODUODENOSCOPY (EGD) WITH PROPOFOL;  Surgeon: [name], MD;  Location: [facility] ENDOSCOPY;  Service: Endoscopy;  Laterality: N/A;  . ESOPHAGOGASTRODUODENOSCOPY (EGD) WITH PROPOFOL N/A 10/25/2018   Procedure: ESOPHAGOGASTRODUODENOSCOPY (EGD) WITH PROPOFOL;  Surgeon: [name], MD;  Location: [facility] ENDOSCOPY;  Service: Endoscopy;  Laterality: N/A;  . HERNIA REPAIR     umbilical  . HIATAL HERNIA REPAIR    . I & D EXTREMITY Left 06/27/2015   Procedure: IRRIGATION AND DEBRIDEMENT EXTREMITY            ( CALF HEMATOMA ) POSSIBLE WOUND VAC;  Surgeon: [name], MD;  Location: [facility] ORS;  Service: Vascular;  Laterality: Left;  . INCISION AND DRAINAGE OF WOUND Left 09/25/2016   Procedure: IRRIGATION AND DEBRIDEMENT - PARTIAL RESECTION OF ACHILLES TENDON WITH WOUND VAC APPLICATION;  Surgeon: [name], DPM;  Location: [facility] OR[facility];  Service: Podiatry;  Laterality: Left;  . INCISION AND DRAINAGE OF WOUND Left 11/09/2018   Procedure: Excision of left foot wound with ACell placement;  Surgeon: Wallace Going, DO;  Location: [place];  Service: Plastics;  Laterality: Left;  . IRRIGATION AND DEBRIDEMENT ABSCESS Left 09/07/2016   Procedure: IRRIGATION AND DEBRIDEMENT ABSCESS LEFT HEEL;  Surgeon: [name], DPM;  Location: [facility] ORS;  Service: Podiatry;  Laterality: Left;  . JOINT REPLACEMENT  2013   left knee replacement  . KIDNEY SURGERY Right    kidney stones  . LITHOTRIPSY    . LOWER EXTREMITY ANGIOGRAPHY Left 04/04/2018   Procedure: LOWER EXTREMITY ANGIOGRAPHY;  Surgeon: [name], MD;  Location: [place] CV LAB;  Service: Cardiovascular;  Laterality: Left;  .  NM GATED MYOVIEW ([place] [facility])  February 2017   Likely breast attenuation. LOW RISK study. Normal EF 55-60%.  . OTHER SURGICAL HISTORY     08/2016 or 09/2016 surgery on achilles tenso h/o staph infection heal. Dr. [name]  . removal of left hematoma Left    leg  . REVERSE SHOULDER ARTHROPLASTY Right 10/08/2015   Procedure: REVERSE SHOULDER ARTHROPLASTY;  Surgeon: [name], MD;  Location: [facility] ORS;  Service: Orthopedics;  Laterality: Right;  . SLEEVE GASTROPLASTY    . TONSILLECTOMY    . TOTAL KNEE ARTHROPLASTY Left   . TOTAL SHOULDER REPLACEMENT Right   . TRANSESOPHAGEAL ECHOCARDIOGRAM  08/08/2013   Mild LVH, EF 60-65%. Moderate LA dilation and mild RA dilation. Mild MR with no evidence of stenosis and no evidence of endocarditis. A false chordae is noted.  . TRANSTHORACIC ECHOCARDIOGRAM  08/03/2013   Mild-Moderate concentric LVH, EF 60-65%. Normal diastolic function. Mild LA dilation. Mild MR with possible vegetation  - not confirmed on TEE   . ULNAR NERVE TRANSPOSITION Right 08/06/2016   Procedure: ULNAR NERVE DECOMPRESSION/TRANSPOSITION;  Surgeon: [name], MD;  Location: [facility] ORS;  Service: Orthopedics;  Laterality: Right;  . UPPER GI ENDOSCOPY    . VAGINAL HYSTERECTOMY      Home Medications:  Allergies as of 09/01/2019      Reactions   Morphine And Related Other (See Comments), Nausea Only   Patient becomes very confused Other reaction(s): Other (See Comments), Other (See Comments) Patient becomes very confused Patient becomes very confused   Penicillins Hives, Shortness Of Breath, Swelling, Other (See Comments)   Facial swelling Has patient had a PCN reaction causing immediate rash, facial/tongue/throat swelling, SOB or lightheadedness with hypotension: Yes Has patient had a PCN reaction causing severe rash involving mucus membranes or skin necrosis: Yes Has patient had a PCN reaction that required hospitalization No Has patient had a PCN reaction occurring within the last 10  years: No If all of the above answers are "NO", then may proceed with Cephalosporin use. Other reaction(s): Other (See Comments), Other (See Comments) Facial swelling Has patient had a PCN reaction causing immediate rash, facial/tongue/throat swelling, SOB or lightheadedness with hypotension: Yes Has patient had a PCN reaction causing severe rash involving mucus membranes or skin necrosis: Yes Has patient had a PCN reaction that required hospitalization No Has patient had a PCN reaction occurring within the last 10 years: No If all of the above answers are "NO", then may proceed with Cephalosporin use. Facial swelling Has patient had a PCN reaction causing immediate rash, facial/tongue/throat swelling, SOB or lightheadedness with hypotension: Yes Has patient had a PCN reaction causing severe rash involving mucus membranes or skin necrosis: Yes Has patient had a PCN reaction that required hospitalization No Has patient had a PCN reaction occurring within the last 10 years: No If all of the above answers are "NO", then may proceed with Cephalosporin use. Facial swelling Has patient had a PCN reaction causing immediate rash, facial/tongue/throat swelling, SOB or lightheadedness with hypotension: Yes Has patient had a PCN reaction causing severe rash involving mucus membranes or skin necrosis: Yes Has patient had a PCN reaction that required hospitalization No Has patient had a PCN reaction occurring within the last 10 years: No If all of the above answers are "NO", then may proceed with Cephalosporin use.   Aspirin Hives   Oxytetracycline Hives   Other reaction(s): Unknown      Medication List       Accurate as of September 01, 2019  4:13 PM. If you have any questions, ask your nurse or doctor.        STOP taking these medications   oxybutynin 15 MG 24 hr tablet Commonly known as: DITROPAN XL Stopped by: Debroah Loop, PA-C     TAKE these medications   Advair HFA 115-21  MCG/ACT inhaler Generic drug: fluticasone-salmeterol Inhale 2 puffs into the lungs daily. Rinse mouth thoroughly after use   albuterol (2.5 MG/3ML) 0.083% nebulizer solution Commonly known as: PROVENTIL Take 3 mLs (2.5 mg total) by nebulization every 6 (six) hours as needed for wheezing or shortness of breath. What changed: Another medication with the same name was changed. Make sure you understand how and when to take each.   albuterol 108 (90 Base) MCG/ACT inhaler Commonly known as: VENTOLIN HFA Inhale 1-2 puffs into the lungs every 4 (four) hours as needed for wheezing or shortness of breath. What changed: how much to take   ALPRAZolam 0.5 MG tablet Commonly known as: XANAX Take 1 tablet (0.5 mg total) by mouth 2 (two) times  daily as needed for anxiety.   amLODipine 5 MG tablet Commonly known as: NORVASC Take 1 tablet (5 mg total) by mouth daily.   buPROPion 150 MG 24 hr tablet Commonly known as: WELLBUTRIN XL Take 1 tablet (150 mg total) by mouth daily.   cetirizine 10 MG tablet Commonly known as: ZYRTEC cetirizine 10 mg tablet  TAKE 1 TABLET ONCE A DAY ORALLY   clobetasol cream 0.05 % Commonly known as: TEMOVATE Apply 1 application topically 2 (two) times daily. To arms What changed:   when to take this  reasons to take this   colchicine 0.6 MG tablet Take by mouth.   Dexilant 60 MG capsule Generic drug: dexlansoprazole Take 60 mg by mouth daily before breakfast.   DULoxetine 60 MG capsule Commonly known as: CYMBALTA Take 1 capsule (60 mg total) by mouth daily.   ergocalciferol 1.25 MG (50000 UT) capsule Commonly known as: VITAMIN D2 ergocalciferol (vitamin D2) 1,250 mcg (50,000 unit) capsule  Take 1 capsule every week by oral route in the morning.   estradiol 0.1 MG/GM vaginal cream Commonly known as: ESTRACE Place 1 Applicatorful vaginally every Monday, Wednesday, and Friday. Apply 0.5mg  (pea-sized amount)  just inside the vaginal introitus with a  finger-tip on Monday, Wednesday and Friday nights,   Flutter Devi Use as directed.   hydrochlorothiazide 12.5 MG tablet Commonly known as: HYDRODIURIL Take 1 tablet (12.5 mg total) by mouth daily. In am   HYDROcodone-acetaminophen 5-325 MG tablet Commonly known as: NORCO/VICODIN Take 1 tablet by mouth every 8 (eight) hours as needed for severe pain. Must last 30 days   HYDROcodone-acetaminophen 5-325 MG tablet Commonly known as: NORCO/VICODIN Take 1 tablet by mouth every 8 (eight) hours as needed for severe pain. Must last 30 days Start taking on: October 01, 2019   HYDROcodone-acetaminophen 5-325 MG tablet Commonly known as: NORCO/VICODIN Take 1 tablet by mouth every 8 (eight) hours as needed for severe pain. Must last 30 days Start taking on: October 31, 2019   levocetirizine 5 MG tablet Commonly known as: XYZAL Take 1 tablet (5 mg total) by mouth every evening.   levothyroxine 175 MCG tablet Commonly known as: SYNTHROID Take 1 tablet (175 mcg total) by mouth daily before breakfast.   losartan-hydrochlorothiazide 50-12.5 MG tablet Commonly known as: HYZAAR Take 1 tablet by mouth daily.   meloxicam 15 MG tablet Commonly known as: MOBIC Take 1 tablet (15 mg total) by mouth daily as needed for pain. Start taking on: September 13, 2019   metFORMIN 1000 MG tablet Commonly known as: GLUCOPHAGE Take 1 tablet (1,000 mg total) by mouth 2 (two) times daily. With food   metoprolol succinate 50 MG 24 hr tablet Commonly known as: TOPROL-XL TAKE 1 TABLET BY MOUTH DAILY   montelukast 10 MG tablet Commonly known as: SINGULAIR Take 1 tablet (10 mg total) by mouth at bedtime.   mupirocin ointment 2 % Commonly known as: Bactroban Apply 1 application topically 2 (two) times daily. Skin wounds   nystatin cream Commonly known as: MYCOSTATIN Apply 1 application topically 2 (two) times daily. Mouth prn for 7 days as needed What changed:   when to take this  reasons to take this     ondansetron 4 MG tablet Commonly known as: Zofran Take 1 tablet (4 mg total) by mouth every 8 (eight) hours as needed for nausea or vomiting.   pantoprazole 40 MG tablet Commonly known as: PROTONIX Take by mouth.   Pradaxa 150 MG Caps capsule Generic drug:  dabigatran TAKE 1 CAPSULE TWICE DAILY   pregabalin 50 MG capsule Commonly known as: LYRICA Take 1 capsule (50 mg total) by mouth 3 (three) times daily.   Toviaz 8 MG Tb24 tablet Generic drug: fesoterodine TAKE ONE TABLET BY MOUTH EVERY DAY   trospium 20 MG tablet Commonly known as: SANCTURA Take 1 tablet (20 mg total) by mouth 2 (two) times daily. Started by: Debroah Loop, PA-C   zinc oxide 20 % ointment Apply 1 application topically as needed for irritation.       Allergies:  Allergies  Allergen Reactions  . Morphine And Related Other (See Comments) and Nausea Only    Patient becomes very confused Other reaction(s): Other (See Comments), Other (See Comments) Patient becomes very confused Patient becomes very confused   . Penicillins Hives, Shortness Of Breath, Swelling and Other (See Comments)    Facial swelling Has patient had a PCN reaction causing immediate rash, facial/tongue/throat swelling, SOB or lightheadedness with hypotension: Yes Has patient had a PCN reaction causing severe rash involving mucus membranes or skin necrosis: Yes Has patient had a PCN reaction that required hospitalization No Has patient had a PCN reaction occurring within the last 10 years: No If all of the above answers are "NO", then may proceed with Cephalosporin use.  Other reaction(s): Other (See Comments), Other (See Comments) Facial swelling Has patient had a PCN reaction causing immediate rash, facial/tongue/throat swelling, SOB or lightheadedness with hypotension: Yes Has patient had a PCN reaction causing severe rash involving mucus membranes or skin necrosis: Yes Has patient had a PCN reaction that required  hospitalization No Has patient had a PCN reaction occurring within the last 10 years: No If all of the above answers are "NO", then may proceed with Cephalosporin use. Facial swelling Has patient had a PCN reaction causing immediate rash, facial/tongue/throat swelling, SOB or lightheadedness with hypotension: Yes Has patient had a PCN reaction causing severe rash involving mucus membranes or skin necrosis: Yes Has patient had a PCN reaction that required hospitalization No Has patient had a PCN reaction occurring within the last 10 years: No If all of the above answers are "NO", then may proceed with Cephalosporin use. Facial swelling Has patient had a PCN reaction causing immediate rash, facial/tongue/throat swelling, SOB or lightheadedness with hypotension: Yes Has patient had a PCN reaction causing severe rash involving mucus membranes or skin necrosis: Yes Has patient had a PCN reaction that required hospitalization No Has patient had a PCN reaction occurring within the last 10 years: No If all of the above answers are "NO", then may proceed with Cephalosporin use.   . Aspirin Hives  . Oxytetracycline Hives    Other reaction(s): Unknown     Family History: Family History  Problem Relation Age of Onset  . Skin cancer Father   . Diabetes Father   . Hypertension Father   . Peripheral vascular disease Father   . Cancer Father   . Cerebral aneurysm Father   . Alcohol abuse Father   . Varicose Veins Mother   . Kidney disease Mother   . Arthritis Mother   . Mental illness Sister   . Cancer Maternal Aunt        breast  . Breast cancer Maternal Aunt   . Arthritis Maternal Grandmother   . Hypertension Maternal Grandmother   . Diabetes Maternal Grandmother   . Arthritis Maternal Grandfather   . Heart disease Maternal Grandfather   . Hypertension Maternal Grandfather   . Arthritis Paternal  Grandmother   . Hypertension Paternal Grandmother   . Diabetes Paternal Grandmother   .  Arthritis Paternal Grandfather   . Hypertension Paternal Grandfather   . Bladder Cancer Neg Hx   . Kidney cancer Neg Hx     Social History:   reports that she has never smoked. She has never used smokeless tobacco. She reports that she does not drink alcohol and does not use drugs.  Physical Exam: BP 138/86   Pulse 80   Ht 5\' 5"  (1.651 m)   Wt 252 lb (114.3 kg)   BMI 41.93 kg/m   Constitutional:  Alert and oriented, no acute distress, nontoxic appearing HEENT: [place], AT Cardiovascular: No clubbing, cyanosis, or edema Respiratory: Normal respiratory effort, no increased work of breathing Skin: No rashes, bruises or suspicious lesions Neurologic: Grossly intact, no focal deficits, moving all 4 extremities Psychiatric: Normal mood and affect  Laboratory Data: Results for orders placed or performed in visit on 09/01/19  Microscopic Examination   Urine  Result Value Ref Range   WBC, UA >30 (H) 0 - 5 /hpf   RBC None seen 0 - 2 /hpf   Epithelial Cells (non renal) 0-10 0 - 10 /hpf   Bacteria, UA Many (A) None seen/Few  Urinalysis, Complete  Result Value Ref Range   Specific Gravity, UA 1.025 1.005 - 1.030   pH, UA 5.5 5.0 - 7.5   Color, UA Yellow Yellow   Appearance Ur Cloudy (A) Clear   Leukocytes,UA 1+ (A) Negative   Protein,UA 1+ (A) Negative/Trace   Glucose, UA Negative Negative   Ketones, UA Trace (A) Negative   RBC, UA Negative Negative   Bilirubin, UA Negative Negative   Urobilinogen, Ur 0.2 0.2 - 1.0 mg/dL   Nitrite, UA Positive (A) Negative   Microscopic Examination See below:   Bladder Scan (Post Void Residual) in office  Result Value Ref Range   Scan Result 44    Assessment & Plan:   1. Mixed incontinence 69 year old female with a history of refractory mixed incontinence on oxybutynin and Toviaz as well as multidrug-resistant E. coli urinary colonization presents with a 2-week history of worsened urinary incontinence.  UA consistent with prior and patient denies  irritative voiding symptoms; no treatment indicated.  Will not send urine for culture.  I offered the patient PTNS versus medication regimen changes today.  She plans to be out of town 1 month from now due to the imminent arrival of a new great grandchild; for this reason she would like to defer PTNS at this time.  I am in agreement with this plan.  We will switch her from oxybutynin 15 mg daily to trospium 20 mg twice daily and plan for symptom recheck before she intends to leave town.  She is in agreement with this plan.  Recommend PTNS for alternative incontinence treatment if her medication regimen fails to achieve a therapeutic goals. - Urinalysis, Complete - Bladder Scan (Post Void Residual) in office - trospium (SANCTURA) 20 MG tablet; Take 1 tablet (20 mg total) by mouth 2 (two) times daily.  Dispense: 60 tablet; Refill: 2   Return in about 4 weeks (around 09/29/2019) for Symptom recheck with Dr. Matilde Sprang.  Debroah Loop, PA-C  [facility] [address], [place], [place] [address] [phone]

## 2019-09-02 ENCOUNTER — Emergency Department: Payer: PPO

## 2019-09-02 ENCOUNTER — Inpatient Hospital Stay
Admission: AD | Admit: 2019-09-02 | Discharge: 2019-09-08 | DRG: 871 | Disposition: A | Discharge status: Home / Self Care | Payer: PPO | Attending: Internal Medicine | Admitting: Internal Medicine

## 2019-09-02 DIAGNOSIS — R778 Other specified abnormalities of plasma proteins: Secondary | ICD-10-CM | POA: Diagnosis not present

## 2019-09-02 DIAGNOSIS — G92 Toxic encephalopathy: Secondary | ICD-10-CM | POA: Diagnosis not present

## 2019-09-02 DIAGNOSIS — I214 Non-ST elevation (NSTEMI) myocardial infarction: Secondary | ICD-10-CM | POA: Diagnosis not present

## 2019-09-02 DIAGNOSIS — R8281 Pyuria: Secondary | ICD-10-CM | POA: Diagnosis present

## 2019-09-02 DIAGNOSIS — A419 Sepsis, unspecified organism: Secondary | ICD-10-CM | POA: Diagnosis not present

## 2019-09-02 DIAGNOSIS — I129 Hypertensive chronic kidney disease with stage 1 through stage 4 chronic kidney disease, or unspecified chronic kidney disease: Secondary | ICD-10-CM | POA: Diagnosis not present

## 2019-09-02 DIAGNOSIS — R7881 Bacteremia: Secondary | ICD-10-CM | POA: Diagnosis not present

## 2019-09-02 DIAGNOSIS — F32A Depression, unspecified: Secondary | ICD-10-CM | POA: Diagnosis present

## 2019-09-02 DIAGNOSIS — Z818 Family history of other mental and behavioral disorders: Secondary | ICD-10-CM

## 2019-09-02 DIAGNOSIS — N1831 Chronic kidney disease, stage 3a: Secondary | ICD-10-CM | POA: Diagnosis not present

## 2019-09-02 DIAGNOSIS — I248 Other forms of acute ischemic heart disease: Secondary | ICD-10-CM | POA: Diagnosis not present

## 2019-09-02 DIAGNOSIS — G894 Chronic pain syndrome: Secondary | ICD-10-CM

## 2019-09-02 DIAGNOSIS — Z833 Family history of diabetes mellitus: Secondary | ICD-10-CM

## 2019-09-02 DIAGNOSIS — R5381 Other malaise: Secondary | ICD-10-CM | POA: Diagnosis not present

## 2019-09-02 DIAGNOSIS — Z20822 Contact with and (suspected) exposure to covid-19: Secondary | ICD-10-CM | POA: Diagnosis not present

## 2019-09-02 DIAGNOSIS — B9689 Other specified bacterial agents as the cause of diseases classified elsewhere: Secondary | ICD-10-CM | POA: Diagnosis not present

## 2019-09-02 DIAGNOSIS — A401 Sepsis due to streptococcus, group B: Secondary | ICD-10-CM | POA: Diagnosis not present

## 2019-09-02 DIAGNOSIS — L03119 Cellulitis of unspecified part of limb: Secondary | ICD-10-CM | POA: Diagnosis present

## 2019-09-02 DIAGNOSIS — J441 Chronic obstructive pulmonary disease with (acute) exacerbation: Secondary | ICD-10-CM

## 2019-09-02 DIAGNOSIS — E1151 Type 2 diabetes mellitus with diabetic peripheral angiopathy without gangrene: Secondary | ICD-10-CM | POA: Diagnosis not present

## 2019-09-02 DIAGNOSIS — L97429 Non-pressure chronic ulcer of left heel and midfoot with unspecified severity: Secondary | ICD-10-CM | POA: Diagnosis present

## 2019-09-02 DIAGNOSIS — Z8614 Personal history of Methicillin resistant Staphylococcus aureus infection: Secondary | ICD-10-CM

## 2019-09-02 DIAGNOSIS — E039 Hypothyroidism, unspecified: Secondary | ICD-10-CM | POA: Diagnosis present

## 2019-09-02 DIAGNOSIS — M797 Fibromyalgia: Secondary | ICD-10-CM

## 2019-09-02 DIAGNOSIS — Z9981 Dependence on supplemental oxygen: Secondary | ICD-10-CM

## 2019-09-02 DIAGNOSIS — J9601 Acute respiratory failure with hypoxia: Secondary | ICD-10-CM

## 2019-09-02 DIAGNOSIS — Z7984 Long term (current) use of oral hypoglycemic drugs: Secondary | ICD-10-CM

## 2019-09-02 DIAGNOSIS — R918 Other nonspecific abnormal finding of lung field: Secondary | ICD-10-CM | POA: Diagnosis not present

## 2019-09-02 DIAGNOSIS — F418 Other specified anxiety disorders: Secondary | ICD-10-CM | POA: Diagnosis present

## 2019-09-02 DIAGNOSIS — E1142 Type 2 diabetes mellitus with diabetic polyneuropathy: Secondary | ICD-10-CM

## 2019-09-02 DIAGNOSIS — L97422 Non-pressure chronic ulcer of left heel and midfoot with fat layer exposed: Secondary | ICD-10-CM | POA: Diagnosis not present

## 2019-09-02 DIAGNOSIS — J9621 Acute and chronic respiratory failure with hypoxia: Secondary | ICD-10-CM | POA: Diagnosis not present

## 2019-09-02 DIAGNOSIS — M79604 Pain in right leg: Secondary | ICD-10-CM | POA: Diagnosis present

## 2019-09-02 DIAGNOSIS — R531 Weakness: Secondary | ICD-10-CM | POA: Diagnosis not present

## 2019-09-02 DIAGNOSIS — B951 Streptococcus, group B, as the cause of diseases classified elsewhere: Secondary | ICD-10-CM | POA: Diagnosis present

## 2019-09-02 DIAGNOSIS — M79671 Pain in right foot: Secondary | ICD-10-CM

## 2019-09-02 DIAGNOSIS — J4541 Moderate persistent asthma with (acute) exacerbation: Secondary | ICD-10-CM

## 2019-09-02 DIAGNOSIS — R0602 Shortness of breath: Secondary | ICD-10-CM | POA: Diagnosis not present

## 2019-09-02 DIAGNOSIS — E785 Hyperlipidemia, unspecified: Secondary | ICD-10-CM | POA: Diagnosis present

## 2019-09-02 DIAGNOSIS — Z841 Family history of disorders of kidney and ureter: Secondary | ICD-10-CM

## 2019-09-02 DIAGNOSIS — Z885 Allergy status to narcotic agent status: Secondary | ICD-10-CM

## 2019-09-02 DIAGNOSIS — L89626 Pressure-induced deep tissue damage of left heel: Secondary | ICD-10-CM | POA: Diagnosis not present

## 2019-09-02 DIAGNOSIS — L03115 Cellulitis of right lower limb: Secondary | ICD-10-CM | POA: Diagnosis not present

## 2019-09-02 DIAGNOSIS — J181 Lobar pneumonia, unspecified organism: Secondary | ICD-10-CM | POA: Diagnosis not present

## 2019-09-02 DIAGNOSIS — G4733 Obstructive sleep apnea (adult) (pediatric): Secondary | ICD-10-CM | POA: Diagnosis present

## 2019-09-02 DIAGNOSIS — F419 Anxiety disorder, unspecified: Secondary | ICD-10-CM | POA: Diagnosis not present

## 2019-09-02 DIAGNOSIS — I10 Essential (primary) hypertension: Secondary | ICD-10-CM | POA: Diagnosis not present

## 2019-09-02 DIAGNOSIS — E1122 Type 2 diabetes mellitus with diabetic chronic kidney disease: Secondary | ICD-10-CM | POA: Diagnosis present

## 2019-09-02 DIAGNOSIS — J189 Pneumonia, unspecified organism: Secondary | ICD-10-CM | POA: Diagnosis not present

## 2019-09-02 DIAGNOSIS — R7989 Other specified abnormal findings of blood chemistry: Secondary | ICD-10-CM | POA: Diagnosis present

## 2019-09-02 DIAGNOSIS — R4182 Altered mental status, unspecified: Secondary | ICD-10-CM | POA: Diagnosis not present

## 2019-09-02 DIAGNOSIS — N3946 Mixed incontinence: Secondary | ICD-10-CM | POA: Diagnosis not present

## 2019-09-02 DIAGNOSIS — Z79899 Other long term (current) drug therapy: Secondary | ICD-10-CM

## 2019-09-02 DIAGNOSIS — I480 Paroxysmal atrial fibrillation: Secondary | ICD-10-CM | POA: Diagnosis present

## 2019-09-02 DIAGNOSIS — M19071 Primary osteoarthritis, right ankle and foot: Secondary | ICD-10-CM | POA: Diagnosis not present

## 2019-09-02 DIAGNOSIS — M79672 Pain in left foot: Secondary | ICD-10-CM

## 2019-09-02 DIAGNOSIS — J440 Chronic obstructive pulmonary disease with acute lower respiratory infection: Secondary | ICD-10-CM | POA: Diagnosis present

## 2019-09-02 DIAGNOSIS — Z96611 Presence of right artificial shoulder joint: Secondary | ICD-10-CM | POA: Diagnosis present

## 2019-09-02 DIAGNOSIS — Z87442 Personal history of urinary calculi: Secondary | ICD-10-CM

## 2019-09-02 DIAGNOSIS — S81801A Unspecified open wound, right lower leg, initial encounter: Secondary | ICD-10-CM | POA: Diagnosis not present

## 2019-09-02 DIAGNOSIS — L0390 Cellulitis, unspecified: Secondary | ICD-10-CM

## 2019-09-02 DIAGNOSIS — E1165 Type 2 diabetes mellitus with hyperglycemia: Secondary | ICD-10-CM | POA: Diagnosis not present

## 2019-09-02 DIAGNOSIS — M19079 Primary osteoarthritis, unspecified ankle and foot: Secondary | ICD-10-CM | POA: Diagnosis present

## 2019-09-02 DIAGNOSIS — R0902 Hypoxemia: Secondary | ICD-10-CM | POA: Diagnosis not present

## 2019-09-02 DIAGNOSIS — F329 Major depressive disorder, single episode, unspecified: Secondary | ICD-10-CM

## 2019-09-02 DIAGNOSIS — Z1639 Resistance to other specified antimicrobial drug: Secondary | ICD-10-CM | POA: Diagnosis present

## 2019-09-02 DIAGNOSIS — S91302A Unspecified open wound, left foot, initial encounter: Secondary | ICD-10-CM | POA: Diagnosis present

## 2019-09-02 DIAGNOSIS — Z6841 Body Mass Index (BMI) 40.0 and over, adult: Secondary | ICD-10-CM | POA: Diagnosis not present

## 2019-09-02 DIAGNOSIS — L8990 Pressure ulcer of unspecified site, unspecified stage: Secondary | ICD-10-CM | POA: Diagnosis present

## 2019-09-02 DIAGNOSIS — Z8249 Family history of ischemic heart disease and other diseases of the circulatory system: Secondary | ICD-10-CM

## 2019-09-02 DIAGNOSIS — D631 Anemia in chronic kidney disease: Secondary | ICD-10-CM | POA: Diagnosis present

## 2019-09-02 DIAGNOSIS — Z811 Family history of alcohol abuse and dependence: Secondary | ICD-10-CM

## 2019-09-02 DIAGNOSIS — M7918 Myalgia, other site: Secondary | ICD-10-CM

## 2019-09-02 DIAGNOSIS — K219 Gastro-esophageal reflux disease without esophagitis: Secondary | ICD-10-CM | POA: Diagnosis present

## 2019-09-02 DIAGNOSIS — E66813 Obesity, class 3: Secondary | ICD-10-CM

## 2019-09-02 DIAGNOSIS — B955 Unspecified streptococcus as the cause of diseases classified elsewhere: Secondary | ICD-10-CM | POA: Diagnosis not present

## 2019-09-02 DIAGNOSIS — Z7901 Long term (current) use of anticoagulants: Secondary | ICD-10-CM

## 2019-09-02 DIAGNOSIS — Z886 Allergy status to analgesic agent status: Secondary | ICD-10-CM

## 2019-09-02 DIAGNOSIS — R627 Adult failure to thrive: Secondary | ICD-10-CM | POA: Diagnosis present

## 2019-09-02 DIAGNOSIS — N390 Urinary tract infection, site not specified: Secondary | ICD-10-CM

## 2019-09-02 DIAGNOSIS — Z8261 Family history of arthritis: Secondary | ICD-10-CM

## 2019-09-02 DIAGNOSIS — E11621 Type 2 diabetes mellitus with foot ulcer: Secondary | ICD-10-CM | POA: Diagnosis present

## 2019-09-02 DIAGNOSIS — Z880 Allergy status to penicillin: Secondary | ICD-10-CM

## 2019-09-02 DIAGNOSIS — M19072 Primary osteoarthritis, left ankle and foot: Secondary | ICD-10-CM | POA: Diagnosis not present

## 2019-09-02 DIAGNOSIS — L309 Dermatitis, unspecified: Secondary | ICD-10-CM | POA: Diagnosis present

## 2019-09-02 DIAGNOSIS — Z808 Family history of malignant neoplasm of other organs or systems: Secondary | ICD-10-CM

## 2019-09-02 DIAGNOSIS — R32 Unspecified urinary incontinence: Secondary | ICD-10-CM

## 2019-09-02 DIAGNOSIS — Z96652 Presence of left artificial knee joint: Secondary | ICD-10-CM | POA: Diagnosis present

## 2019-09-02 DIAGNOSIS — Z888 Allergy status to other drugs, medicaments and biological substances status: Secondary | ICD-10-CM

## 2019-09-02 DIAGNOSIS — I482 Chronic atrial fibrillation, unspecified: Secondary | ICD-10-CM | POA: Diagnosis not present

## 2019-09-02 DIAGNOSIS — G8929 Other chronic pain: Secondary | ICD-10-CM

## 2019-09-02 DIAGNOSIS — Z803 Family history of malignant neoplasm of breast: Secondary | ICD-10-CM

## 2019-09-02 DIAGNOSIS — Z79890 Hormone replacement therapy (postmenopausal): Secondary | ICD-10-CM

## 2019-09-02 DIAGNOSIS — G929 Unspecified toxic encephalopathy: Secondary | ICD-10-CM

## 2019-09-02 LAB — CBC WITH DIFFERENTIAL/PLATELET
Abs Immature Granulocytes: 0.06 10*3/uL (ref 0.00–0.07)
Basophils Absolute: 0.1 10*3/uL (ref 0.0–0.1)
Basophils Relative: 0 %
Eosinophils Absolute: 0 10*3/uL (ref 0.0–0.5)
Eosinophils Relative: 0 %
HCT: 32.6 % — ABNORMAL LOW (ref 36.0–46.0)
Hemoglobin: 10.2 g/dL — ABNORMAL LOW (ref 12.0–15.0)
Immature Granulocytes: 1 %
Lymphocytes Relative: 4 %
Lymphs Abs: 0.4 10*3/uL — ABNORMAL LOW (ref 0.7–4.0)
MCH: 26.4 pg (ref 26.0–34.0)
MCHC: 31.3 g/dL (ref 30.0–36.0)
MCV: 84.2 fL (ref 80.0–100.0)
Monocytes Absolute: 0.8 10*3/uL (ref 0.1–1.0)
Monocytes Relative: 6 %
Neutro Abs: 10.9 10*3/uL — ABNORMAL HIGH (ref 1.7–7.7)
Neutrophils Relative %: 89 %
Platelets: 251 10*3/uL (ref 150–400)
RBC: 3.87 MIL/uL (ref 3.87–5.11)
RDW: 15.9 % — ABNORMAL HIGH (ref 11.5–15.5)
WBC: 12.2 10*3/uL — ABNORMAL HIGH (ref 4.0–10.5)
nRBC: 0 % (ref 0.0–0.2)

## 2019-09-02 LAB — COMPREHENSIVE METABOLIC PANEL
ALT: 9 U/L (ref 0–44)
AST: 13 U/L — ABNORMAL LOW (ref 15–41)
Albumin: 3.4 g/dL — ABNORMAL LOW (ref 3.5–5.0)
Alkaline Phosphatase: 74 U/L (ref 38–126)
Anion gap: 11 (ref 5–15)
BUN: 21 mg/dL (ref 8–23)
CO2: 25 mmol/L (ref 22–32)
Calcium: 8.5 mg/dL — ABNORMAL LOW (ref 8.9–10.3)
Chloride: 100 mmol/L (ref 98–111)
Creatinine, Ser: 1.26 mg/dL — ABNORMAL HIGH (ref 0.44–1.00)
GFR calc Af Amer: 50 mL/min — ABNORMAL LOW (ref >60)
GFR calc non Af Amer: 43 mL/min — ABNORMAL LOW (ref >60)
Glucose, Bld: 237 mg/dL — ABNORMAL HIGH (ref 70–99)
Potassium: 4 mmol/L (ref 3.5–5.1)
Sodium: 136 mmol/L (ref 135–145)
Total Bilirubin: 0.7 mg/dL (ref 0.3–1.2)
Total Protein: 6.8 g/dL (ref 6.5–8.1)

## 2019-09-02 LAB — GLUCOSE, CAPILLARY
Glucose-Capillary: 228 mg/dL — ABNORMAL HIGH (ref 70–99)
Glucose-Capillary: 282 mg/dL — ABNORMAL HIGH (ref 70–99)

## 2019-09-02 LAB — PROTIME-INR
INR: 1.5 — ABNORMAL HIGH (ref 0.8–1.2)
Prothrombin Time: 17.4 seconds — ABNORMAL HIGH (ref 11.4–15.2)

## 2019-09-02 LAB — HEMOGLOBIN A1C
Hgb A1c MFr Bld: 6.8 % — ABNORMAL HIGH (ref 4.8–5.6)
Mean Plasma Glucose: 148.46 mg/dL

## 2019-09-02 LAB — TROPONIN I (HIGH SENSITIVITY)
Troponin I (High Sensitivity): 120 ng/L (ref <18)
Troponin I (High Sensitivity): 171 ng/L (ref <18)
Troponin I (High Sensitivity): 143 ng/L (ref <18)

## 2019-09-02 LAB — SARS CORONAVIRUS 2 BY RT PCR (HOSPITAL ORDER, PERFORMED IN ~~LOC~~ HOSPITAL LAB): SARS Coronavirus 2: NEGATIVE

## 2019-09-02 LAB — HEPARIN LEVEL (UNFRACTIONATED): Heparin Unfractionated: 0.22 IU/mL — ABNORMAL LOW (ref 0.30–0.70)

## 2019-09-02 LAB — BRAIN NATRIURETIC PEPTIDE: B Natriuretic Peptide: 384 pg/mL — ABNORMAL HIGH (ref 0.0–100.0)

## 2019-09-02 LAB — APTT: aPTT: 50 seconds — ABNORMAL HIGH (ref 24–36)

## 2019-09-02 LAB — LACTIC ACID, PLASMA: Lactic Acid, Venous: 1.2 mmol/L (ref 0.5–1.9)

## 2019-09-02 MED ORDER — HEPARIN (PORCINE) 25000 UT/250ML-% IV SOLN
INTRAVENOUS | Status: AC
  Administered 2019-09-02: 1000 [IU]/h via INTRAVENOUS
  Filled 2019-09-02: qty 250

## 2019-09-02 MED ORDER — LEVOTHYROXINE SODIUM 50 MCG PO TABS
175.0000 ug | ORAL_TABLET | Freq: Every day | ORAL | Status: DC
  Administered 2019-09-03 – 2019-09-08 (×6): 175 ug via ORAL
  Filled 2019-09-02 (×7): qty 1

## 2019-09-02 MED ORDER — DULOXETINE HCL 30 MG PO CPEP
60.0000 mg | ORAL_CAPSULE | Freq: Every day | ORAL | Status: DC
  Administered 2019-09-02 – 2019-09-06 (×5): 60 mg via ORAL
  Filled 2019-09-02 (×5): qty 2

## 2019-09-02 MED ORDER — LOSARTAN POTASSIUM 50 MG PO TABS
50.0000 mg | ORAL_TABLET | Freq: Every day | ORAL | Status: DC
  Administered 2019-09-02 – 2019-09-08 (×7): 50 mg via ORAL
  Filled 2019-09-02 (×7): qty 1

## 2019-09-02 MED ORDER — INSULIN ASPART 100 UNIT/ML ~~LOC~~ SOLN
0.0000 [IU] | Freq: Three times a day (TID) | SUBCUTANEOUS | Status: DC
  Administered 2019-09-02: 7 [IU] via SUBCUTANEOUS
  Administered 2019-09-03 (×2): 11 [IU] via SUBCUTANEOUS
  Administered 2019-09-04 – 2019-09-05 (×4): 3 [IU] via SUBCUTANEOUS
  Administered 2019-09-05 – 2019-09-07 (×4): 4 [IU] via SUBCUTANEOUS
  Administered 2019-09-08 (×3): 3 [IU] via SUBCUTANEOUS
  Filled 2019-09-02 (×12): qty 1

## 2019-09-02 MED ORDER — HYDROCHLOROTHIAZIDE 12.5 MG PO CAPS
12.5000 mg | ORAL_CAPSULE | Freq: Every day | ORAL | Status: DC
  Administered 2019-09-02 – 2019-09-08 (×7): 12.5 mg via ORAL
  Filled 2019-09-02 (×7): qty 1

## 2019-09-02 MED ORDER — IPRATROPIUM-ALBUTEROL 0.5-2.5 (3) MG/3ML IN SOLN
3.0000 mL | Freq: Four times a day (QID) | RESPIRATORY_TRACT | Status: DC
  Administered 2019-09-02 – 2019-09-03 (×3): 3 mL via RESPIRATORY_TRACT
  Filled 2019-09-02 (×3): qty 3

## 2019-09-02 MED ORDER — FESOTERODINE FUMARATE ER 8 MG PO TB24
8.0000 mg | ORAL_TABLET | Freq: Every day | ORAL | Status: DC
  Administered 2019-09-02 – 2019-09-08 (×7): 8 mg via ORAL
  Filled 2019-09-02 (×7): qty 1

## 2019-09-02 MED ORDER — PREGABALIN 50 MG PO CAPS
50.0000 mg | ORAL_CAPSULE | Freq: Three times a day (TID) | ORAL | Status: DC
  Administered 2019-09-02 – 2019-09-08 (×19): 50 mg via ORAL
  Filled 2019-09-02 (×19): qty 1

## 2019-09-02 MED ORDER — METRONIDAZOLE IN NACL 5-0.79 MG/ML-% IV SOLN
500.0000 mg | Freq: Once | INTRAVENOUS | Status: AC
  Administered 2019-09-02: 500 mg via INTRAVENOUS
  Filled 2019-09-02: qty 100

## 2019-09-02 MED ORDER — MONTELUKAST SODIUM 10 MG PO TABS
10.0000 mg | ORAL_TABLET | Freq: Every day | ORAL | Status: DC
  Administered 2019-09-02 – 2019-09-07 (×6): 10 mg via ORAL
  Filled 2019-09-02 (×6): qty 1

## 2019-09-02 MED ORDER — METOPROLOL SUCCINATE ER 50 MG PO TB24
50.0000 mg | ORAL_TABLET | Freq: Every day | ORAL | Status: DC
  Administered 2019-09-02 – 2019-09-08 (×7): 50 mg via ORAL
  Filled 2019-09-02 (×7): qty 1

## 2019-09-02 MED ORDER — VITAMIN D (ERGOCALCIFEROL) 1.25 MG (50000 UNIT) PO CAPS
50000.0000 [IU] | ORAL_CAPSULE | ORAL | Status: DC
  Administered 2019-09-07: 50000 [IU] via ORAL
  Filled 2019-09-02: qty 1

## 2019-09-02 MED ORDER — HEPARIN BOLUS VIA INFUSION
2000.0000 [IU] | Freq: Once | INTRAVENOUS | Status: DC
  Filled 2019-09-02: qty 2000

## 2019-09-02 MED ORDER — LORATADINE 10 MG PO TABS
10.0000 mg | ORAL_TABLET | Freq: Every day | ORAL | Status: DC
  Administered 2019-09-03 – 2019-09-08 (×6): 10 mg via ORAL
  Filled 2019-09-02 (×7): qty 1

## 2019-09-02 MED ORDER — NYSTATIN 100000 UNIT/GM EX POWD
Freq: Two times a day (BID) | CUTANEOUS | Status: DC
  Filled 2019-09-02: qty 15

## 2019-09-02 MED ORDER — ACETAMINOPHEN 650 MG RE SUPP: 650.0000 mg | Freq: Four times a day (QID) | RECTAL | Status: DC | PRN

## 2019-09-02 MED ORDER — ONDANSETRON HCL 4 MG/2ML IJ SOLN
4.0000 mg | Freq: Four times a day (QID) | INTRAMUSCULAR | Status: DC | PRN
  Administered 2019-09-07: 4 mg via INTRAVENOUS
  Filled 2019-09-02: qty 2

## 2019-09-02 MED ORDER — HEPARIN (PORCINE) 25000 UT/250ML-% IV SOLN: 1100.0000 [IU]/h | INTRAVENOUS | Status: DC

## 2019-09-02 MED ORDER — ONDANSETRON HCL 4 MG PO TABS: 4.0000 mg | ORAL_TABLET | Freq: Four times a day (QID) | ORAL | Status: DC | PRN

## 2019-09-02 MED ORDER — LOSARTAN POTASSIUM-HCTZ 50-12.5 MG PO TABS: 1.0000 | ORAL_TABLET | Freq: Every day | ORAL | Status: DC

## 2019-09-02 MED ORDER — ALBUTEROL SULFATE (2.5 MG/3ML) 0.083% IN NEBU
2.5000 mg | INHALATION_SOLUTION | Freq: Four times a day (QID) | RESPIRATORY_TRACT | Status: DC | PRN
  Administered 2019-09-06: 2.5 mg via RESPIRATORY_TRACT
  Filled 2019-09-02: qty 3

## 2019-09-02 MED ORDER — SODIUM CHLORIDE 0.9 % IV SOLN: 250.0000 mL | INTRAVENOUS | Status: DC | PRN

## 2019-09-02 MED ORDER — HEPARIN BOLUS VIA INFUSION
1500.0000 [IU] | Freq: Once | INTRAVENOUS | Status: AC
  Administered 2019-09-02: 1500 [IU] via INTRAVENOUS
  Filled 2019-09-02: qty 1500

## 2019-09-02 MED ORDER — IPRATROPIUM-ALBUTEROL 0.5-2.5 (3) MG/3ML IN SOLN
3.0000 mL | Freq: Once | RESPIRATORY_TRACT | Status: AC
  Administered 2019-09-02: 3 mL via RESPIRATORY_TRACT
  Filled 2019-09-02: qty 3

## 2019-09-02 MED ORDER — SODIUM CHLORIDE 0.9% FLUSH: 3.0000 mL | INTRAVENOUS | Status: DC | PRN

## 2019-09-02 MED ORDER — MOMETASONE FURO-FORMOTEROL FUM 200-5 MCG/ACT IN AERO
2.0000 | INHALATION_SPRAY | Freq: Two times a day (BID) | RESPIRATORY_TRACT | Status: DC
  Administered 2019-09-02 – 2019-09-08 (×12): 2 via RESPIRATORY_TRACT
  Filled 2019-09-02: qty 8.8

## 2019-09-02 MED ORDER — DARIFENACIN HYDROBROMIDE ER 7.5 MG PO TB24: 7.5000 mg | ORAL_TABLET | Freq: Every day | ORAL | Status: DC

## 2019-09-02 MED ORDER — INSULIN ASPART 100 UNIT/ML ~~LOC~~ SOLN: 0.0000 [IU] | SUBCUTANEOUS | Status: DC

## 2019-09-02 MED ORDER — PANTOPRAZOLE SODIUM 40 MG PO TBEC
40.0000 mg | DELAYED_RELEASE_TABLET | Freq: Every day | ORAL | Status: DC
  Administered 2019-09-02 – 2019-09-08 (×7): 40 mg via ORAL
  Filled 2019-09-02 (×7): qty 1

## 2019-09-02 MED ORDER — BUPROPION HCL ER (XL) 150 MG PO TB24
150.0000 mg | ORAL_TABLET | Freq: Every day | ORAL | Status: DC
  Administered 2019-09-02 – 2019-09-08 (×7): 150 mg via ORAL
  Filled 2019-09-02 (×7): qty 1

## 2019-09-02 MED ORDER — FLUCONAZOLE 50 MG PO TABS
150.0000 mg | ORAL_TABLET | Freq: Once | ORAL | Status: AC
  Administered 2019-09-02: 150 mg via ORAL
  Filled 2019-09-02: qty 1

## 2019-09-02 MED ORDER — LEVOFLOXACIN IN D5W 500 MG/100ML IV SOLN: 500.0000 mg | INTRAVENOUS | Status: DC

## 2019-09-02 MED ORDER — IOHEXOL 350 MG/ML SOLN
60.0000 mL | Freq: Once | INTRAVENOUS | Status: AC | PRN
  Administered 2019-09-02: 60 mL via INTRAVENOUS

## 2019-09-02 MED ORDER — SODIUM CHLORIDE 0.9% FLUSH
3.0000 mL | Freq: Two times a day (BID) | INTRAVENOUS | Status: DC
  Administered 2019-09-03 – 2019-09-08 (×11): 3 mL via INTRAVENOUS

## 2019-09-02 MED ORDER — INSULIN ASPART 100 UNIT/ML ~~LOC~~ SOLN: 0.0000 [IU] | Freq: Every day | SUBCUTANEOUS | Status: DC

## 2019-09-02 MED ORDER — SODIUM CHLORIDE 0.9 % IV SOLN
500.0000 mg | INTRAVENOUS | Status: DC
  Administered 2019-09-02: 500 mg via INTRAVENOUS
  Filled 2019-09-02: qty 500

## 2019-09-02 MED ORDER — METHYLPREDNISOLONE SODIUM SUCC 125 MG IJ SOLR
125.0000 mg | Freq: Once | INTRAMUSCULAR | Status: AC
  Administered 2019-09-02: 125 mg via INTRAVENOUS
  Filled 2019-09-02: qty 2

## 2019-09-02 MED ORDER — SODIUM CHLORIDE 0.9 % IV SOLN: 2.0000 g | Freq: Once | INTRAVENOUS | Status: DC

## 2019-09-02 MED ORDER — HYDROCODONE-ACETAMINOPHEN 5-325 MG PO TABS
1.0000 | ORAL_TABLET | Freq: Three times a day (TID) | ORAL | Status: DC | PRN
  Administered 2019-09-07: 1 via ORAL
  Filled 2019-09-02: qty 1

## 2019-09-02 MED ORDER — ACETAMINOPHEN 325 MG PO TABS: 650.0000 mg | ORAL_TABLET | Freq: Four times a day (QID) | ORAL | Status: DC | PRN

## 2019-09-02 MED ORDER — VANCOMYCIN HCL IN DEXTROSE 1-5 GM/200ML-% IV SOLN: 1000.0000 mg | Freq: Once | INTRAVENOUS | Status: DC

## 2019-09-02 MED ORDER — INSULIN ASPART 100 UNIT/ML ~~LOC~~ SOLN: 0.0000 [IU] | Freq: Three times a day (TID) | SUBCUTANEOUS | Status: DC

## 2019-09-02 MED ORDER — SODIUM CHLORIDE 0.9 % IV SOLN
2.0000 g | Freq: Once | INTRAVENOUS | Status: DC
  Administered 2019-09-02: 2 g via INTRAVENOUS
  Filled 2019-09-02: qty 2

## 2019-09-02 MED ORDER — COLCHICINE 0.6 MG PO TABS
0.6000 mg | ORAL_TABLET | Freq: Every day | ORAL | Status: DC | PRN
  Filled 2019-09-02: qty 1

## 2019-09-02 MED ORDER — ALPRAZOLAM 0.5 MG PO TABS
0.5000 mg | ORAL_TABLET | Freq: Two times a day (BID) | ORAL | Status: DC | PRN
  Administered 2019-09-02 – 2019-09-05 (×4): 0.5 mg via ORAL
  Filled 2019-09-02 (×4): qty 1

## 2019-09-02 MED ORDER — ESTRADIOL 0.1 MG/GM VA CREA
1.0000 | TOPICAL_CREAM | VAGINAL | Status: DC
  Administered 2019-09-04 – 2019-09-08 (×3): 1 via VAGINAL
  Filled 2019-09-02: qty 42.5

## 2019-09-02 MED ORDER — SODIUM CHLORIDE 0.9 % IV SOLN
2.0000 g | INTRAVENOUS | Status: DC
  Administered 2019-09-03 – 2019-09-08 (×6): 2 g via INTRAVENOUS
  Filled 2019-09-02 (×3): qty 20
  Filled 2019-09-02 (×2): qty 2
  Filled 2019-09-02: qty 20
  Filled 2019-09-02 (×2): qty 2

## 2019-09-02 NOTE — Consult Note (Addendum)
PHARMACY -  BRIEF ANTIBIOTIC NOTE   Pharmacy has received consult(s) for vancomycin and aztreonam from an ED provider. Authorization from ED provider to change aztreonam to cefepime if appropriate after investigation of PCN allergy. Patient is also ordered metronidazole. The patient's profile has been reviewed for ht/wt/allergies/indication/available labs.    Patient has tolerated cephalosporins in the past including cephalexin per chart. Based on this information will change aztreonam to cefepime.   One time order(s) placed for -Vancomycin 1 g -Cefepime 2 g   Update: RN had already administered aztreonam dose at time of therapy change. Spoke with RN - continue aztreonam dose. Will discontinue cefepime.   Further antibiotics/pharmacy consults should be ordered by admitting physician if indicated.                       Thank you, [place] Resident 09/02/2019  11:46 AM

## 2019-09-02 NOTE — Progress Notes (Signed)
Patient admitted to room 256 she is alert and oriented denies any c/o of pain at this time

## 2019-09-02 NOTE — H&P (Addendum)
History and Physical    [name] ULA:[id number] DOB: 06/22/1950 DOA: 09/02/2019  PCP: [name], [name], MD   Patient coming from: Home  I have personally briefly reviewed patient's old medical records in [place]  Chief Complaint: Weakness, fatigue  HPI: [name] is a 69 y.o. adult with medical history significant for anxiety, depression, diabetes mellitus, COPD, hypertension, morbid obesity who presents to the emergency room for evaluation of generalized weakness and shortness of breath for 2 days.  Patient states that she had been feeling unwell for about 2 days and it was mostly shortness of breath with exertion but on the night prior to admission when she started feeling very weak associated with chills and a cough productive of yellow phlegm.  Patient also states that she had mild dysuria and was seen by urology as an outpatient and was told her urine was sterile. On the morning of her admission she was very weak and unable to get up out of bed.  Her husband called EMS due to worsening weakness Per EMS patient had a temp of 100.8 when they arrived and she received Tylenol 975 mg x 1 dose as well as albuterol treatment.  Patient was noted to be hypoxic by EMS and her pulse oximetry on room air was in the low 80s.  Patient was placed on oxygen supplementation with improvement in her pulse oximetry. Her labs done today showed a serum troponin of 120, white count of 12.2 with a left shift as well as pyuria. Chest x-ray shows no acute findings Twelve-lead EKG shows minimal ST depression in the inferior leads.  ED Course: 69 year old female seen in the emergency room for evaluation of shortness of breath and weakness.  Patient was noted to have a low-grade temp with a T-max of 100.12F.  She was also hypoxic in the field and has pyuria. Troponin is elevated and she has been started on a heparin drip.  Review of Systems: As per HPI otherwise 10 point review of systems  negative.    Past Medical History:  Diagnosis Date  . Abnormal antibody titer   . Anxiety and depression   . Arthritis   . Asthma   . Cystocele   . Depression   . Diabetes ([place])   . DVT (deep venous thrombosis) (HCC)    Righ calf  . Dyspnea    11/08/2018 - "more now due to lack of exercise"  . Dysrhythmia    afib  . Edema   . GERD (gastroesophageal reflux disease)   . Heart murmur    Echocardiogram June 2015: Mild MR (possible vegetation seen on TTE, not seen on TEE), normal LV size with moderate concentric LVH. Normal function EF 60-65%. Normal diastolic function. Mild LA dilation.  Marland Kitchen History of IBS   . History of kidney stones   . History of methicillin resistant staphylococcus aureus (MRSA) 2008  . Hyperlipidemia   . Hypertension   . Hypothyroidism   . Insomnia   . Kidney stone    kidney stones with lithotripsy .  [facility]- "adrenal glands"  . Neuropathy involving both lower extremities   . Obesity, Class II, BMI 35-39.9, with comorbidity   . Paroxysmal atrial fibrillation ([place]) 2007   In June 2015, Cardiac Event Monitor: Mostly SR/sinus arrhythmia with PVCs that are frequent. Short bursts of A. fib lasting several minutes;; CHA2DS2-VASc Score = 5 (age, Female, PVD, DM, HTN)  . Peripheral vascular disease ([place])   . Sleep apnea  use C-PAP  . Urinary incontinence   . Venous stasis dermatitis of both lower extremities     Past Surgical History:  Procedure Laterality Date  . ANKLE SURGERY Right   . APPLICATION OF WOUND VAC Left 06/27/2015   Procedure: APPLICATION OF WOUND VAC ( POSSIBLE ) ;  Surgeon: [name], MD;  Location: [facility] ORS;  Service: Vascular;  Laterality: Left;  . APPLICATION OF WOUND VAC Left 09/25/2016   Procedure: APPLICATION OF WOUND VAC;  Surgeon: [name], DPM;  Location: [facility] ORS;  Service: Podiatry;  Laterality: Left;  . arm surgery     right    . CHOLECYSTECTOMY    . COLONOSCOPY    . COLONOSCOPY WITH PROPOFOL N/A 01/11/2017    Procedure: COLONOSCOPY WITH PROPOFOL;  Surgeon: [name], MD;  Location: [facility] ENDOSCOPY;  Service: Endoscopy;  Laterality: N/A;  . COLONOSCOPY WITH PROPOFOL N/A 02/22/2017   Procedure: COLONOSCOPY WITH PROPOFOL;  Surgeon: [name], MD;  Location: [facility] ENDOSCOPY;  Service: Endoscopy;  Laterality: N/A;  . COLONOSCOPY WITH PROPOFOL N/A 05/31/2017   Procedure: COLONOSCOPY WITH PROPOFOL;  Surgeon: [name], MD;  Location: [facility] ENDOSCOPY;  Service: Endoscopy;  Laterality: N/A;  . ESOPHAGOGASTRODUODENOSCOPY (EGD) WITH PROPOFOL N/A 01/11/2017   Procedure: ESOPHAGOGASTRODUODENOSCOPY (EGD) WITH PROPOFOL;  Surgeon: [name], MD;  Location: [facility] ENDOSCOPY;  Service: Endoscopy;  Laterality: N/A;  . ESOPHAGOGASTRODUODENOSCOPY (EGD) WITH PROPOFOL N/A 10/25/2018   Procedure: ESOPHAGOGASTRODUODENOSCOPY (EGD) WITH PROPOFOL;  Surgeon: [name], MD;  Location: [facility] ENDOSCOPY;  Service: Endoscopy;  Laterality: N/A;  . HERNIA REPAIR     umbilical  . HIATAL HERNIA REPAIR    . I & D EXTREMITY Left 06/27/2015   Procedure: IRRIGATION AND DEBRIDEMENT EXTREMITY            ( CALF HEMATOMA ) POSSIBLE WOUND VAC;  Surgeon: [name], MD;  Location: [facility] ORS;  Service: Vascular;  Laterality: Left;  . INCISION AND DRAINAGE OF WOUND Left 09/25/2016   Procedure: IRRIGATION AND DEBRIDEMENT - PARTIAL RESECTION OF ACHILLES TENDON WITH WOUND VAC APPLICATION;  Surgeon: [name], DPM;  Location: [facility] ORS;  Service: Podiatry;  Laterality: Left;  . INCISION AND DRAINAGE OF WOUND Left 11/09/2018   Procedure: Excision of left foot wound with ACell placement;  Surgeon: Wallace Going, DO;  Location: [place];  Service: Plastics;  Laterality: Left;  . IRRIGATION AND DEBRIDEMENT ABSCESS Left 09/07/2016   Procedure: IRRIGATION AND DEBRIDEMENT ABSCESS LEFT HEEL;  Surgeon: [name], DPM;  Location: [facility] ORS;  Service: Podiatry;  Laterality: Left;  . JOINT REPLACEMENT  2013   left knee  replacement  . KIDNEY SURGERY Right    kidney stones  . LITHOTRIPSY    . LOWER EXTREMITY ANGIOGRAPHY Left 04/04/2018   Procedure: LOWER EXTREMITY ANGIOGRAPHY;  Surgeon: [name], MD;  Location: [place] CV LAB;  Service: Cardiovascular;  Laterality: Left;  . NM GATED MYOVIEW ([place] HX)  February 2017   Likely breast attenuation. LOW RISK study. Normal EF 55-60%.  . OTHER SURGICAL HISTORY     08/2016 or 09/2016 surgery on achilles tenso h/o staph infection heal. Dr. [name]  . removal of left hematoma Left    leg  . REVERSE SHOULDER ARTHROPLASTY Right 10/08/2015   Procedure: REVERSE SHOULDER ARTHROPLASTY;  Surgeon: [name], MD;  Location: [facility] ORS;  Service: Orthopedics;  Laterality: Right;  . SLEEVE GASTROPLASTY    . TONSILLECTOMY    . TOTAL KNEE ARTHROPLASTY Left   .  TOTAL SHOULDER REPLACEMENT Right   . TRANSESOPHAGEAL ECHOCARDIOGRAM  08/08/2013   Mild LVH, EF 60-65%. Moderate LA dilation and mild RA dilation. Mild MR with no evidence of stenosis and no evidence of endocarditis. A false chordae is noted.  . TRANSTHORACIC ECHOCARDIOGRAM  08/03/2013   Mild-Moderate concentric LVH, EF 60-65%. Normal diastolic function. Mild LA dilation. Mild MR with possible vegetation  - not confirmed on TEE   . ULNAR NERVE TRANSPOSITION Right 08/06/2016   Procedure: ULNAR NERVE DECOMPRESSION/TRANSPOSITION;  Surgeon: [name], MD;  Location: [facility] ORS;  Service: Orthopedics;  Laterality: Right;  . UPPER GI ENDOSCOPY    . VAGINAL HYSTERECTOMY       reports that she has never smoked. She has never used smokeless tobacco. She reports that she does not drink alcohol and does not use drugs.  Allergies  Allergen Reactions  . Morphine And Related Other (See Comments) and Nausea Only    Patient becomes very confused Other reaction(s): Other (See Comments), Other (See Comments) Patient becomes very confused Patient becomes very confused   . Penicillins Hives, Shortness Of Breath, Swelling and  Other (See Comments)    Facial swelling Has patient had a PCN reaction causing immediate rash, facial/tongue/throat swelling, SOB or lightheadedness with hypotension: Yes Has patient had a PCN reaction causing severe rash involving mucus membranes or skin necrosis: Yes Has patient had a PCN reaction that required hospitalization No Has patient had a PCN reaction occurring within the last 10 years: No If all of the above answers are "NO", then may proceed with Cephalosporin use.  Other reaction(s): Other (See Comments), Other (See Comments) Facial swelling Has patient had a PCN reaction causing immediate rash, facial/tongue/throat swelling, SOB or lightheadedness with hypotension: Yes Has patient had a PCN reaction causing severe rash involving mucus membranes or skin necrosis: Yes Has patient had a PCN reaction that required hospitalization No Has patient had a PCN reaction occurring within the last 10 years: No If all of the above answers are "NO", then may proceed with Cephalosporin use. Facial swelling Has patient had a PCN reaction causing immediate rash, facial/tongue/throat swelling, SOB or lightheadedness with hypotension: Yes Has patient had a PCN reaction causing severe rash involving mucus membranes or skin necrosis: Yes Has patient had a PCN reaction that required hospitalization No Has patient had a PCN reaction occurring within the last 10 years: No If all of the above answers are "NO", then may proceed with Cephalosporin use. Facial swelling Has patient had a PCN reaction causing immediate rash, facial/tongue/throat swelling, SOB or lightheadedness with hypotension: Yes Has patient had a PCN reaction causing severe rash involving mucus membranes or skin necrosis: Yes Has patient had a PCN reaction that required hospitalization No Has patient had a PCN reaction occurring within the last 10 years: No If all of the above answers are "NO", then may proceed with Cephalosporin  use.   . Aspirin Hives  . Oxytetracycline Hives    Other reaction(s): Unknown     Family History  Problem Relation Age of Onset  . Skin cancer Father   . Diabetes Father   . Hypertension Father   . Peripheral vascular disease Father   . Cancer Father   . Cerebral aneurysm Father   . Alcohol abuse Father   . Varicose Veins Mother   . Kidney disease Mother   . Arthritis Mother   . Mental illness Sister   . Cancer Maternal Aunt        breast  .  Breast cancer Maternal Aunt   . Arthritis Maternal Grandmother   . Hypertension Maternal Grandmother   . Diabetes Maternal Grandmother   . Arthritis Maternal Grandfather   . Heart disease Maternal Grandfather   . Hypertension Maternal Grandfather   . Arthritis Paternal Grandmother   . Hypertension Paternal Grandmother   . Diabetes Paternal Grandmother   . Arthritis Paternal Grandfather   . Hypertension Paternal Grandfather   . Bladder Cancer Neg Hx   . Kidney cancer Neg Hx      Prior to Admission medications   Medication Sig Start Date End Date Taking? Authorizing Provider  ALPRAZolam Duanne Moron) 0.5 MG tablet Take 1 tablet (0.5 mg total) by mouth 2 (two) times daily as needed for anxiety. 07/04/19  Yes [name], [name], MD  albuterol (PROVENTIL HFA;VENTOLIN HFA) 108 (90 Base) MCG/ACT inhaler Inhale 1-2 puffs into the lungs every 4 (four) hours as needed for wheezing or shortness of breath. Patient taking differently: Inhale 2 puffs into the lungs every 4 (four) hours as needed for wheezing or shortness of breath.  03/29/15   Bobetta Lime, MD  albuterol (PROVENTIL) (2.5 MG/3ML) 0.083% nebulizer solution Take 3 mLs (2.5 mg total) by nebulization every 6 (six) hours as needed for wheezing or shortness of breath. 02/21/15   Ashok Norris, MD  buPROPion (WELLBUTRIN XL) 150 MG 24 hr tablet Take 1 tablet (150 mg total) by mouth daily. 05/02/19   [name], [name], MD  cetirizine (ZYRTEC) 10 MG tablet cetirizine 10 mg tablet   TAKE 1 TABLET ONCE A DAY ORALLY    [provider]  clobetasol cream (TEMOVATE) 2.22 % Apply 1 application topically 2 (two) times daily. To arms Patient taking differently: Apply 1 application topically 2 (two) times daily as needed (irritation). To arms 02/03/18   [name], [name], MD  colchicine 0.6 MG tablet Take by mouth. 03/15/19   [provider]  dexlansoprazole (DEXILANT) 60 MG capsule Take 60 mg by mouth daily before breakfast.    [provider]  DULoxetine (CYMBALTA) 60 MG capsule Take 1 capsule (60 mg total) by mouth daily. 08/30/19 02/26/20  Milinda Pointer, MD  ergocalciferol (VITAMIN D2) 1.25 MG (50000 UT) capsule ergocalciferol (vitamin D2) 1,250 mcg (50,000 unit) capsule  Take 1 capsule every week by oral route in the morning.    [provider]  estradiol (ESTRACE) 0.1 MG/GM vaginal cream Place 1 Applicatorful vaginally every Monday, Wednesday, and Friday. Apply 0.5mg  (pea-sized amount)  just inside the vaginal introitus with a finger-tip on Monday, Wednesday and Friday nights, 02/01/19   Zara Council A, PA-C  fluticasone-salmeterol (ADVAIR HFA) 115-21 MCG/ACT inhaler Inhale 2 puffs into the lungs daily. Rinse mouth thoroughly after use 08/16/19   Tyler Pita, MD  hydrochlorothiazide (HYDRODIURIL) 12.5 MG tablet Take 1 tablet (12.5 mg total) by mouth daily. In am 02/12/19   [name], [name], MD  HYDROcodone-acetaminophen (NORCO/VICODIN) 5-325 MG tablet Take 1 tablet by mouth every 8 (eight) hours as needed for severe pain. Must last 30 days 09/01/19 10/01/19  Milinda Pointer, MD  HYDROcodone-acetaminophen (NORCO/VICODIN) 5-325 MG tablet Take 1 tablet by mouth every 8 (eight) hours as needed for severe pain. Must last 30 days 10/01/19 10/31/19  Milinda Pointer, MD  HYDROcodone-acetaminophen (NORCO/VICODIN) 5-325 MG tablet Take 1 tablet by mouth every 8 (eight) hours as needed for severe pain. Must last 30 days 10/31/19  11/30/19  Milinda Pointer, MD  levocetirizine (XYZAL) 5 MG tablet Take 1 tablet (5 mg total) by mouth  every evening. 08/12/18   [name], [name], MD  levothyroxine (SYNTHROID) 175 MCG tablet Take 1 tablet (175 mcg total) by mouth daily before breakfast. 03/10/19   [name], [name], MD  losartan-hydrochlorothiazide (HYZAAR) 50-12.5 MG tablet Take 1 tablet by mouth daily. 02/02/19   [provider]  meloxicam (MOBIC) 15 MG tablet Take 1 tablet (15 mg total) by mouth daily as needed for pain. 09/13/19   [name], [name], MD  metFORMIN (GLUCOPHAGE) 1000 MG tablet Take 1 tablet (1,000 mg total) by mouth 2 (two) times daily. With food 12/14/18   [name], [name], MD  metoprolol succinate (TOPROL-XL) 50 MG 24 hr tablet TAKE 1 TABLET BY MOUTH DAILY 07/05/19   Wellington Hampshire, MD  montelukast (SINGULAIR) 10 MG tablet Take 1 tablet (10 mg total) by mouth at bedtime. 02/03/19   [name], [name], MD  mupirocin ointment (BACTROBAN) 2 % Apply 1 application topically 2 (two) times daily. Skin wounds 10/04/17   [name], [name], MD  nystatin cream (MYCOSTATIN) Apply 1 application topically 2 (two) times daily. Mouth prn for 7 days as needed Patient taking differently: Apply 1 application topically 2 (two) times daily as needed for dry skin. Mouth prn for 7 days as needed 11/17/17   [name], [name], MD  ondansetron (ZOFRAN) 4 MG tablet Take 1 tablet (4 mg total) by mouth every 8 (eight) hours as needed for nausea or vomiting. 02/14/19   [name], [name], MD  pantoprazole (PROTONIX) 40 MG tablet Take by mouth. 04/07/19 04/06/20  [provider]  PRADAXA 150 MG CAPS capsule TAKE 1 CAPSULE TWICE DAILY 06/05/19   Wellington Hampshire, MD  pregabalin (LYRICA) 50 MG capsule Take 1 capsule (50 mg total) by mouth 3 (three) times daily. 08/30/19 02/26/20  Milinda Pointer, MD  Respiratory Therapy Supplies (FLUTTER) DEVI Use as directed. 05/09/19    Tyler Pita, MD  sucralfate (CARAFATE) 1 g tablet Take 1 g by mouth 3 (three) times daily. 08/25/19   [provider]  sucralfate (CARAFATE) 1 GM/10ML suspension Take 1 g by mouth 3 (three) times daily. 08/15/19   [provider]  TOVIAZ 8 MG TB24 tablet TAKE ONE TABLET BY MOUTH EVERY DAY 08/07/19   McGowan, [name] A, PA-C  trospium (SANCTURA) 20 MG tablet Take 1 tablet (20 mg total) by mouth 2 (two) times daily. 09/01/19 11/30/19  Debroah Loop, PA-C  zinc oxide 20 % ointment Apply 1 application topically as needed for irritation. 09/02/17   Nori Riis, PA-C    Physical Exam: Vitals:   09/02/19 1230 09/02/19 1300 09/02/19 1400 09/02/19 1430  BP: 122/69 115/64 125/60 129/85  Pulse: 74 83 77 74  Resp: (!) 25 14 (!) 26 19  Temp:      TempSrc:      SpO2: 96% 95% 96% 97%  Weight:      Height:         Vitals:   09/02/19 1230 09/02/19 1300 09/02/19 1400 09/02/19 1430  BP: 122/69 115/64 125/60 129/85  Pulse: 74 83 77 74  Resp: (!) 25 14 (!) 26 19  Temp:      TempSrc:      SpO2: 96% 95% 96% 97%  Weight:      Height:        Constitutional: NAD, alert and oriented x 3, Chronically ill appearing Eyes: PERRL, lids and conjunctivae normal ENMT: Mucous membranes are moist.  Neck: normal, supple, no masses, no thyromegaly Respiratory: Rhonchi right middle  lobe, no wheezing, no crackles. Normal respiratory effort. No accessory muscle use.  Cardiovascular: Regular rate and rhythm, no murmurs / rubs / gallops. No extremity edema. 2+ pedal pulses. No carotid bruits.  Abdomen: no tenderness, no masses palpated. No hepatosplenomegaly. Bowel sounds positive.  Musculoskeletal: no clubbing / cyanosis. No joint deformity upper and lower extremities.  Skin: no rashes, lesions, ulcers.  Neurologic: No gross focal neurologic deficit.  Generalized weakness Psychiatric: Normal mood and affect.   Labs on Admission: I have personally reviewed following labs and  imaging studies  CBC: Recent Labs  Lab 09/02/19 1044  WBC 12.2*  NEUTROABS 10.9*  HGB 10.2*  HCT 32.6*  MCV 84.2  PLT 628   Basic Metabolic Panel: Recent Labs  Lab 09/02/19 1044  NA 136  K 4.0  CL 100  CO2 25  GLUCOSE 237*  BUN 21  CREATININE 1.26*  CALCIUM 8.5*   GFR: Estimated Creatinine Clearance (by C-G formula based on SCr of 1.26 mg/dL (H)) Female: 53.2 mL/min (A) Female: 64.6 mL/min (A) Liver Function Tests: Recent Labs  Lab 09/02/19 1044  AST 13*  ALT 9  ALKPHOS 74  BILITOT 0.7  PROT 6.8  ALBUMIN 3.4*   No results for input(s): LIPASE, AMYLASE in the last 168 hours. No results for input(s): AMMONIA in the last 168 hours. Coagulation Profile: Recent Labs  Lab 09/02/19 1235  INR 1.5*   Cardiac Enzymes: No results for input(s): CKTOTAL, CKMB, CKMBINDEX, TROPONINI in the last 168 hours. BNP (last 3 results) No results for input(s): PROBNP in the last 8760 hours. HbA1C: No results for input(s): HGBA1C in the last 72 hours. CBG: No results for input(s): GLUCAP in the last 168 hours. Lipid Profile: No results for input(s): CHOL, HDL, LDLCALC, TRIG, CHOLHDL, LDLDIRECT in the last 72 hours. Thyroid Function Tests: No results for input(s): TSH, T4TOTAL, FREET4, T3FREE, THYROIDAB in the last 72 hours. Anemia Panel: No results for input(s): VITAMINB12, FOLATE, FERRITIN, TIBC, IRON, RETICCTPCT in the last 72 hours. Urine analysis:    Component Value Date/Time   COLORURINE YELLOW (A) 09/28/2018 1840   APPEARANCEUR Cloudy (A) 09/01/2019 1420   LABSPEC 1.008 09/28/2018 1840   PHURINE 5.0 09/28/2018 1840   GLUCOSEU Negative 09/01/2019 1420   GLUCOSEU NEGATIVE 08/02/2017 1427   HGBUR MODERATE (A) 09/28/2018 1840   BILIRUBINUR Negative 09/01/2019 1420   BILIRUBINUR 1+ 09/15/2017 1319   KETONESUR NEGATIVE 09/28/2018 1840   PROTEINUR 1+ (A) 09/01/2019 1420   PROTEINUR NEGATIVE 09/28/2018 1840   UROBILINOGEN 0.2 09/15/2017 1319   UROBILINOGEN 0.2  08/02/2017 1427   NITRITE Positive (A) 09/01/2019 1420   NITRITE NEGATIVE 09/28/2018 1840   LEUKOCYTESUR 1+ (A) 09/01/2019 1420   LEUKOCYTESUR SMALL (A) 09/28/2018 1840    Radiological Exams on Admission: CT Angio Chest PE W and/or Wo Contrast  Result Date: 09/02/2019 CLINICAL DATA:  Shortness of breath EXAM: CT ANGIOGRAPHY CHEST WITH CONTRAST TECHNIQUE: Multidetector CT imaging of the chest was performed using the standard protocol during bolus administration of intravenous contrast. Multiplanar CT image reconstructions and MIPs were obtained to evaluate the vascular anatomy. CONTRAST:  56mL OMNIPAQUE IOHEXOL 350 MG/ML SOLN COMPARISON:  07/28/2018 FINDINGS: Cardiovascular: Satisfactory opacification of the pulmonary arteries to the segmental level. No evidence of pulmonary embolism. Cardiomegaly. No pericardial effusion. Mediastinum/Nodes: No enlarged mediastinal, hilar, or axillary lymph nodes. Thyroid gland, trachea, and esophagus demonstrate no significant findings. Lungs/Pleura: Bandlike scarring of the right lung base with elevation of the right hemidiaphragm. Heterogeneous airspace opacity of the  right middle lobe (series 6, image 50). No pleural effusion or pneumothorax. Upper Abdomen: No acute abnormality. Partially imaged findings of partial gastrectomy in the included upper abdomen. Musculoskeletal: No chest wall abnormality. No acute or significant osseous findings. Review of the MIP images confirms the above findings. IMPRESSION: 1. Negative examination for pulmonary embolism. 2. Heterogeneous airspace opacity of the right middle lobe concerning for infection. 3. Cardiomegaly. Electronically Signed   By: Eddie Candle M.D.   On: 09/02/2019 13:32   DG Chest Port 1 View  Result Date: 09/02/2019 CLINICAL DATA:  Altered mental status with weakness EXAM: PORTABLE CHEST 1 VIEW COMPARISON:  Chest radiograph and chest CT Jul 28, 2018 FINDINGS: There is stable elevation of the right hemidiaphragm. No  edema or airspace opacity. Heart size and pulmonary vascularity normal. No adenopathy. There is a total shoulder replacement on the right. IMPRESSION: Elevation right hemidiaphragm, stable. No edema or airspace opacity. Stable cardiac silhouette. No adenopathy. Electronically Signed   By: Lowella Grip III M.D.   On: 09/02/2019 11:30    EKG: Independently reviewed.  Sinus rhythm ST depression in the inferior leads  Assessment/Plan Principal Problem:   CAP (community acquired pneumonia) Active Problems:   Paroxysmal atrial fibrillation (HCC); CHA2DS2-Vasc Score 5. On Pradaxa   Essential hypertension   Hypothyroidism, unspecified   Anxiety and depression   Morbid obesity with BMI of 40.0-44.9, adult (HCC)   Acute lower UTI   Elevated troponin   Acute on chronic respiratory failure with hypoxia (HCC)     Community-acquired pneumonia Patient presents to the emergency room for evaluation of a 2-day history of shortness of breath associated with fever, chills and a productive cough CT scan of the chest done with contrast showed a right middle lobe pneumonia We will treat patient empirically with Rocephin and Zithromax Follow-up results of blood cultures   Acute on chronic respiratory failure Has a history of COPD and is morbidly obese and may have an element of obesity hypoventilation syndrome Patient was hypoxic in the field with pulse oximetry in the 80s on room air that improved with oxygen supplementation We will attempt to wean patient off oxygen as tolerated She will need to be assessed for home oxygen need prior to discharge   Elevated troponin Most likely secondary to demand ischemia from Community-acquired pneumonia We will trend troponin Obtain 2D echocardiogram to rule out regional wall motion abnormality Consult Cardiology    UTI (POA) Patient with urinary symptoms and a low-grade fever She has pyuria We will treat patient empirically witH IV Rocephin daily  until culture results become available    Morbid obesity (BMI 40) Complicates overall prognosis and care   Hypothyroidism Continue Synthroid   Anxiety and depression Continue bupropion and alprazolam   Hypertension Blood pressure stable Continue metoprolol/losartan /HCTZ   History of atrial fibrillation Continue metoprolol for rate control Continue Pradaxa as primary prophylaxis for an acute stroke   Physical deconditioning Patient is very weak and has difficulty with ambulation as well as carrying out her ADLs She will need physical therapy evaluation once her respiratory status improves  DVT prophylaxis: Pradaxa Code Status: Full Code Family Communication: Greater than 50% of time was spent discussing patient's condition and plan of care with her and her husband at the bedside.  Imaging results of blood test were discussed with him in detail.  Ther verbalized understanding and agreed with the plan of care. Disposition Plan: Back to previous home environment Consults called: None    Janelie Goltz  Djimon Lundstrom MD [facility]     09/02/2019, 3:00 PM

## 2019-09-02 NOTE — Plan of Care (Signed)
  Problem: Education: Goal: Knowledge of General Education information will improve Description: Including pain rating scale, medication(s)/side effects and non-pharmacologic comfort measures Outcome: Progressing   Problem: Clinical Measurements: Goal: Respiratory complications will improve Outcome: Progressing   Problem: Activity: Goal: Risk for activity intolerance will decrease Outcome: Progressing   

## 2019-09-02 NOTE — ED Notes (Signed)
Patient transported to CT at this time. 

## 2019-09-02 NOTE — Consult Note (Addendum)
ANTICOAGULATION CONSULT NOTE - Initial Consult  Pharmacy Consult for Heparin Infusion Indication: chest pain/ACS  Patient Measurements: Height: 5\' 5"  (165.1 cm) Weight: 114.3 kg (252 lb) IBW/kg (Calculated) : 57 Heparin Dosing Weight: 84.2 kg  Vital Signs: Temp: 99.7 F (37.6 C) (06/12 1034) Temp Source: Oral (06/12 1034) BP: 118/65 (06/12 1146) Pulse Rate: 72 (06/12 1146)  Labs: Recent Labs    09/02/19 1044  HGB 10.2*  HCT 32.6*  PLT 251  CREATININE 1.26*  TROPONINIHS 120*    Estimated Creatinine Clearance (by C-G formula based on SCr of 1.26 mg/dL (H)) Female: 53.2 mL/min (A) Female: 64.6 mL/min (A)   Medical History: Past Medical History:  Diagnosis Date   Abnormal antibody titer    Anxiety and depression    Arthritis    Asthma    Cystocele    Depression    Diabetes ([place])    DVT (deep venous thrombosis) (HCC)    Righ calf   Dyspnea    11/08/2018 - "more now due to lack of exercise"   Dysrhythmia    afib   Edema    GERD (gastroesophageal reflux disease)    Heart murmur    Echocardiogram June 2015: Mild MR (possible vegetation seen on TTE, not seen on TEE), normal LV size with moderate concentric LVH. Normal function EF 60-65%. Normal diastolic function. Mild LA dilation.   History of IBS    History of kidney stones    History of methicillin resistant staphylococcus aureus (MRSA) 2008   Hyperlipidemia    Hypertension    Hypothyroidism    Insomnia    Kidney stone    kidney stones with lithotripsy .  [facility]- "adrenal glands"   Neuropathy involving both lower extremities    Obesity, Class II, BMI 35-39.9, with comorbidity    Paroxysmal atrial fibrillation ([place]) 2007   In June 2015, Cardiac Event Monitor: Mostly SR/sinus arrhythmia with PVCs that are frequent. Short bursts of A. fib lasting several minutes;; CHA2DS2-VASc Score = 5 (age, Female, PVD, DM, HTN)   Peripheral vascular disease (HCC)    Sleep apnea    use  C-PAP   Urinary incontinence    Venous stasis dermatitis of both lower extremities     Medications:  Dabigatran 150 mg BID prior to admission. Patient reported last dose was 6/11 @ 2230  Assessment: Patient is a 69 y/o F with medical history including atrial fibrillation on anticoagulation with dabigatran, peripheral vascular disease, T2DM, COPD who presented to the ED with shortness of breath, subjective fever, and generalized fatigue/weakness. Pharmacy has been consulted to initiate heparin infusion for NSTEMI.   Baseline CBC with Hgb 10.2, platelets within normal limits. Baseline aPTT elevated to 50s and INR elevated to 1.5.   Goal of Therapy:  Heparin level 0.3-0.7 units/ml Monitor platelets by anticoagulation protocol: Yes   Plan:  -Heparin continuous infusion at 1000 units/hr, no bolus in view of elevated baseline aPTT and INR -Check HL 6 hours after initiation of infusion -Daily CBC per protocol  [place] Resident 09/02/2019,12:11 PM

## 2019-09-02 NOTE — Progress Notes (Signed)
CODE SEPSIS - PHARMACY COMMUNICATION  **Broad Spectrum Antibiotics should be administered within 1 hour of Sepsis diagnosis**  Time Code Sepsis Called/Page Received: 1138  Antibiotics Ordered: Aztreonam, vancomycin, metronidazole  Time of 1st antibiotic administration: 1153 (aztreonam)  Additional action taken by pharmacy: [place] Resident 09/02/2019  11:58 AM

## 2019-09-02 NOTE — ED Triage Notes (Signed)
Patient arrived via EMS from home. Patient is AOx4 and ambulatory with 4-point walker. Patients significant other called 911 due to patient not being able to get up from floor, patient states she did not fall. Patient has been feeling weak and fatigued with no feelings of SOB however patient was in low 80s for O2 saturation. Patient is not on room air at home. Patient is febrile with a temporal temp of 100.8.  1 Albuterol treatment given  975 Acetaminophen given   Hx of COPD and cellulitis in lower extremities.

## 2019-09-02 NOTE — Consult Note (Signed)
ANTICOAGULATION CONSULT NOTE - Initial Consult  Pharmacy Consult for Heparin Infusion Indication: chest pain/ACS  Patient Measurements: Height: 5\' 5"  (165.1 cm) Weight: 114.3 kg (252 lb) IBW/kg (Calculated) : 57 Heparin Dosing Weight: 84.2 kg  Vital Signs: Temp: 98.4 F (36.9 C) (06/12 2037) Temp Source: Oral (06/12 2037) BP: 112/87 (06/12 2037) Pulse Rate: 75 (06/12 2037)  Labs: Recent Labs    09/02/19 1044 09/02/19 1235 09/02/19 1430 09/02/19 1557 09/02/19 1849  HGB 10.2*  --   --   --   --   HCT 32.6*  --   --   --   --   PLT 251  --   --   --   --   APTT  --  50*  --   --   --   LABPROT  --  17.4*  --   --   --   INR  --  1.5*  --   --   --   HEPARINUNFRC  --   --   --   --  0.22*  CREATININE 1.26*  --   --   --   --   TROPONINIHS 120*  --  171* 143*  --     Estimated Creatinine Clearance (by C-G formula based on SCr of 1.26 mg/dL (H)) Female: 53.2 mL/min (A) Female: 64.6 mL/min (A)   Medical History: Past Medical History:  Diagnosis Date  . Abnormal antibody titer   . Anxiety and depression   . Arthritis   . Asthma   . Cystocele   . Depression   . Diabetes ([place])   . DVT (deep venous thrombosis) (HCC)    Righ calf  . Dyspnea    11/08/2018 - "more now due to lack of exercise"  . Dysrhythmia    afib  . Edema   . GERD (gastroesophageal reflux disease)   . Heart murmur    Echocardiogram June 2015: Mild MR (possible vegetation seen on TTE, not seen on TEE), normal LV size with moderate concentric LVH. Normal function EF 60-65%. Normal diastolic function. Mild LA dilation.  [place] Kitchen History of IBS   . History of kidney stones   . History of methicillin resistant staphylococcus aureus (MRSA) 2008  . Hyperlipidemia   . Hypertension   . Hypothyroidism   . Insomnia   . Kidney stone    kidney stones with lithotripsy .  [facility]- "adrenal glands"  . Neuropathy involving both lower extremities   . Obesity, Class II, BMI 35-39.9, with comorbidity   .  Paroxysmal atrial fibrillation ([place]) 2007   In June 2015, Cardiac Event Monitor: Mostly SR/sinus arrhythmia with PVCs that are frequent. Short bursts of A. fib lasting several minutes;; CHA2DS2-VASc Score = 5 (age, Female, PVD, DM, HTN)  . Peripheral vascular disease ([place])   . Sleep apnea    use C-PAP  . Urinary incontinence   . Venous stasis dermatitis of both lower extremities     Medications:  Dabigatran 150 mg BID prior to admission. Patient reported last dose was 6/11 @ 2230  Assessment: Patient is a 69 y/o F with medical history including atrial fibrillation on anticoagulation with dabigatran, peripheral vascular disease, T2DM, COPD who presented to the ED with shortness of breath, subjective fever, and generalized fatigue/weakness. Pharmacy has been consulted to initiate heparin infusion for NSTEMI.   Baseline CBC with Hgb 10.2, platelets within normal limits. Baseline aPTT elevated to 50s and INR elevated to 1.5.   Goal of Therapy:  Heparin  level 0.3-0.7 units/ml Monitor platelets by anticoagulation protocol: Yes   Plan:  -06/12@1849  HL 0.22, subtherapeutic. Will bolus 1500 units x1 and increase drip rate to 1100 units/hr -Check HL 6 hours after initiation of infusion -Daily CBC per protocol  Pearla Dubonnet, PharmD Clinical Pharmacist 09/02/2019 9:08 PM

## 2019-09-02 NOTE — ED Provider Notes (Signed)
[facility] Emergency Department Provider Note  Time seen: 11:31 AM  I have reviewed the triage vital signs and the nursing notes.   HISTORY  Chief Complaint Weakness, Fatigue, and Shortness of Breath   HPI GLADYSE CORVIN is a 69 y.o. adult with a past medical history of anxiety, depression, diabetes, COPD, reflux, hypertension, hyperlipidemia, obesity, presents to the emergency department for shortness of breath, subjective fever generalized fatigue/weakness.  According to the patient she has been feeling well until last night when she began feeling somewhat weak, chills with moderate cough.  She also states she has been experiencing some mild dysuria over the last few days however saw urology yesterday and had a negative urine sample per patient.   Patient had an oral temperature per EMS of 100.8 received 975 mg of acetaminophen and 1 albuterol treatment.  Patient has a history of COPD but does not wear any oxygen at baseline.  Past Medical History:  Diagnosis Date  . Abnormal antibody titer   . Anxiety and depression   . Arthritis   . Asthma   . Cystocele   . Depression   . Diabetes ([place])   . DVT (deep venous thrombosis) (HCC)    Righ calf  . Dyspnea    11/08/2018 - "more now due to lack of exercise"  . Dysrhythmia    afib  . Edema   . GERD (gastroesophageal reflux disease)   . Heart murmur    Echocardiogram June 2015: Mild MR (possible vegetation seen on TTE, not seen on TEE), normal LV size with moderate concentric LVH. Normal function EF 60-65%. Normal diastolic function. Mild LA dilation.  Marland Kitchen History of IBS   . History of kidney stones   . History of methicillin resistant staphylococcus aureus (MRSA) 2008  . Hyperlipidemia   . Hypertension   . Hypothyroidism   . Insomnia   . Kidney stone    kidney stones with lithotripsy .  [place] [facility]- "adrenal glands"  . Neuropathy involving both lower extremities   . Obesity, Class II, BMI 35-39.9, with  comorbidity   . Paroxysmal atrial fibrillation ([place]) 2007   In June 2015, Cardiac Event Monitor: Mostly SR/sinus arrhythmia with PVCs that are frequent. Short bursts of A. fib lasting several minutes;; CHA2DS2-VASc Score = 5 (age, Female, PVD, DM, HTN)  . Peripheral vascular disease ([place])   . Sleep apnea    use C-PAP  . Urinary incontinence   . Venous stasis dermatitis of both lower extremities     Patient Active Problem List   Diagnosis Date Noted  . Pharmacologic therapy 08/30/2019  . Recurrent falls 07/10/2019  . Left ankle pain 06/02/2019  . Chronic pain of right ankle 02/14/2019  . Arthritis 02/14/2019  . Nausea and vomiting 02/14/2019  . Meralgia paresthetica of right side 12/05/2018  . Chronic anticoagulation (Pradaxa) 12/05/2018  . Peripheral vascular disease ([place]) 10/18/2018  . Chronic ulcer of right leg ([place]) 10/18/2018  . Fibromyalgia 08/17/2018  . Chronic musculoskeletal pain 08/17/2018  . Tremor of right hand 08/03/2018  . Overactive bladder 08/03/2018  . Physical deconditioning 08/03/2018  . Chronic UTI 07/29/2018  . Foot ulcer, left ([place]) 03/09/2018  . Adrenal mass ([place]) 08/02/2017  . Eczema 06/27/2017  . Diabetic ulcer of left foot ([place]) 06/24/2017  . Mass of both adrenal glands ([place]) 05/16/2017  . Fatty liver 05/13/2017  . Strain of right hip 04/30/2017  . Ischial pain, right 04/30/2017  . Pain of left hip joint 04/22/2017  .  Abnormality of gait and mobility 04/22/2017  . Muscle strain of left hip 04/09/2017  . Anemia 11/18/2016  . Asthma 11/18/2016  . Carpal tunnel syndrome 11/18/2016  . Pain in joint involving ankle and foot 11/18/2016  . Obstructive sleep apnea of adult 11/18/2016  . Spinal stenosis of thoracic region 11/18/2016  . Abscess of tendon of left foot 09/21/2016  . Cubital tunnel syndrome on right 08/07/2016  . Chronic pain syndrome 02/25/2016  . Long term current use of opiate analgesic 02/25/2016  . Chronic right shoulder pain  02/25/2016  . Status post reverse total shoulder replacement 10/08/2015  . 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 Morbid obesity with BMI of 40.0-44.9, adult ([place]) 09/23/2015  . Chronic prescription benzodiazepine use 09/23/2015  . Arthralgia 09/23/2015  . Osteoarthritis involving multiple joints 09/23/2015  . Other shoulder lesions (Right) 08/28/2015  . Cervical radiculitis 08/06/2015  . Fall 05/22/2015  . Opiate use (15 MME/Day) 01/16/2015  . Impingement syndrome of shoulder region 01/07/2015  . Mixed incontinence 11/26/2014  . Atrophic vaginitis 11/26/2014  . Anxiety and depression 09/21/2014  . Bladder cystocele 09/21/2014  . Gastro-esophageal reflux disease without esophagitis 09/21/2014  . DM (diabetes mellitus) type II, controlled, with peripheral vascular disorder ([place]) 08/31/2014  . Diabetic peripheral neuropathy associated with type 2 diabetes mellitus ([place]) 08/31/2014  . Asthma, well controlled 08/31/2014  . Hypothyroidism, unspecified 08/31/2014  . Peripheral vascular disease due to secondary diabetes mellitus ([place]) 12/11/2013  . Sleep apnea 10/19/2013  . Paroxysmal atrial fibrillation ([place]); CHA2DS2-Vasc Score 5. On Pradaxa 07/27/2013  . Essential hypertension 07/27/2013  . Chronic atrial fibrillation 08/10/2012    Past Surgical History:  Procedure Laterality Date  . ANKLE SURGERY Right   . APPLICATION OF WOUND VAC Left 06/27/2015   Procedure: APPLICATION OF WOUND VAC ( POSSIBLE ) ;  Surgeon: [name], MD;  Location: [facility] ORS;  Service: Vascular;  Laterality: Left;  . APPLICATION OF WOUND VAC Left 09/25/2016   Procedure: APPLICATION OF WOUND VAC;  Surgeon: [name], DPM;  Location: [facility] ORS;  Service: Podiatry;  Laterality: Left;  . arm surgery     right    . CHOLECYSTECTOMY    . COLONOSCOPY    . COLONOSCOPY WITH PROPOFOL N/A 01/11/2017   Procedure: COLONOSCOPY WITH PROPOFOL;  Surgeon: [name], MD;  Location: [facility]  ENDOSCOPY;  Service: Endoscopy;  Laterality: N/A;  . COLONOSCOPY WITH PROPOFOL N/A 02/22/2017   Procedure: COLONOSCOPY WITH PROPOFOL;  Surgeon: [name], MD;  Location: [facility] ENDOSCOPY;  Service: Endoscopy;  Laterality: N/A;  . COLONOSCOPY WITH PROPOFOL N/A 05/31/2017   Procedure: COLONOSCOPY WITH PROPOFOL;  Surgeon: [name], MD;  Location[facility] ENDOSCOPY;  Service: Endoscopy;  Laterality: N/A;  . ESOPHAGOGASTRODUODENOSCOPY (EGD) WITH PROPOFOL N/A 01/11/2017   Procedure: ESOPHAGOGASTRODUODENOSCOPY (EGD) WITH PROPOFOL;  Surgeon: [name], MD;  Location: [facility] ENDOSCOPY;  Service: Endoscopy;  Laterality: N/A;  . ESOPHAGOGASTRODUODENOSCOPY (EGD) WITH PROPOFOL N/A 10/25/2018   Procedure: ESOPHAGOGASTRODUODENOSCOPY (EGD) WITH PROPOFOL;  Surgeon: [name], MD;  Location: [facility] ENDOSCOPY;  Service: Endoscopy;  Laterality: N/A;  . HERNIA REPAIR     umbilical  . HIATAL HERNIA REPAIR    . I & D EXTREMITY Left 06/27/2015   Procedure: IRRIGATION AND DEBRIDEMENT EXTREMITY            ( CALF HEMATOMA ) POSSIBLE WOUND VAC;  Surgeon: [name], MD;  Location: [facility] ORS;  Service: Vascular;  Laterality: Left;  .  INCISION AND DRAINAGE OF WOUND Left 09/25/2016   Procedure: IRRIGATION AND DEBRIDEMENT - PARTIAL RESECTION OF ACHILLES TENDON WITH WOUND VAC APPLICATION;  Surgeon: [name], DPM;  Location: [facility] ORS;  Service: Podiatry;  Laterality: Left;  . INCISION AND DRAINAGE OF WOUND Left 11/09/2018   Procedure: Excision of left foot wound with ACell placement;  Surgeon: Wallace Going, DO;  Location: [place];  Service: Plastics;  Laterality: Left;  . IRRIGATION AND DEBRIDEMENT ABSCESS Left 09/07/2016   Procedure: IRRIGATION AND DEBRIDEMENT ABSCESS LEFT HEEL;  Surgeon: [name], DPM;  Location: [facility] ORS;  Service: Podiatry;  Laterality: Left;  . JOINT REPLACEMENT  2013   left knee replacement  . KIDNEY SURGERY Right    kidney stones  . LITHOTRIPSY    . LOWER  EXTREMITY ANGIOGRAPHY Left 04/04/2018   Procedure: LOWER EXTREMITY ANGIOGRAPHY;  Surgeon: [name], MD;  Location: [place] CV LAB;  Service: Cardiovascular;  Laterality: Left;  . NM GATED MYOVIEW ([employer] HX)  February 2017   Likely breast attenuation. LOW RISK study. Normal EF 55-60%.  . OTHER SURGICAL HISTORY     08/2016 or 09/2016 surgery on achilles tenso h/o staph infection heal. Dr. [name]  . removal of left hematoma Left    leg  . REVERSE SHOULDER ARTHROPLASTY Right 10/08/2015   Procedure: REVERSE SHOULDER ARTHROPLASTY;  Surgeon: [name], MD;  Location: [facility] ORS;  Service: Orthopedics;  Laterality: Right;  . SLEEVE GASTROPLASTY    . TONSILLECTOMY    . TOTAL KNEE ARTHROPLASTY Left   . TOTAL SHOULDER REPLACEMENT Right   . TRANSESOPHAGEAL ECHOCARDIOGRAM  08/08/2013   Mild LVH, EF 60-65%. Moderate LA dilation and mild RA dilation. Mild MR with no evidence of stenosis and no evidence of endocarditis. A false chordae is noted.  . TRANSTHORACIC ECHOCARDIOGRAM  08/03/2013   Mild-Moderate concentric LVH, EF 60-65%. Normal diastolic function. Mild LA dilation. Mild MR with possible vegetation  - not confirmed on TEE   . ULNAR NERVE TRANSPOSITION Right 08/06/2016   Procedure: ULNAR NERVE DECOMPRESSION/TRANSPOSITION;  Surgeon: [name], MD;  Location: [facility] ORS;  Service: Orthopedics;  Laterality: Right;  . UPPER GI ENDOSCOPY    . VAGINAL HYSTERECTOMY      Prior to Admission medications   Medication Sig Start Date End Date Taking? Authorizing Provider  albuterol (PROVENTIL HFA;VENTOLIN HFA) 108 (90 Base) MCG/ACT inhaler Inhale 1-2 puffs into the lungs every 4 (four) hours as needed for wheezing or shortness of breath. Patient taking differently: Inhale 2 puffs into the lungs every 4 (four) hours as needed for wheezing or shortness of breath.  03/29/15   Bobetta Lime, MD  albuterol (PROVENTIL) (2.5 MG/3ML) 0.083% nebulizer solution Take 3 mLs (2.5 mg total) by nebulization every 6  (six) hours as needed for wheezing or shortness of breath. 02/21/15   Ashok Norris, MD  ALPRAZolam Duanne Moron) 0.5 MG tablet Take 1 tablet (0.5 mg total) by mouth 2 (two) times daily as needed for anxiety. 07/04/19   [name], [name], MD  amLODipine (NORVASC) 5 MG tablet Take 1 tablet (5 mg total) by mouth daily. 04/20/18   [name], [name], MD  buPROPion (WELLBUTRIN XL) 150 MG 24 hr tablet Take 1 tablet (150 mg total) by mouth daily. 05/02/19   [name], [name], MD  cetirizine (ZYRTEC) 10 MG tablet cetirizine 10 mg tablet  TAKE 1 TABLET ONCE A DAY ORALLY    [provider]  clobetasol cream (TEMOVATE) 7.41 % Apply 1 application topically  2 (two) times daily. To arms Patient taking differently: Apply 1 application topically 2 (two) times daily as needed (irritation). To arms 02/03/18   [name], [name], MD  colchicine 0.6 MG tablet Take by mouth. 03/15/19   [provider]  dexlansoprazole (DEXILANT) 60 MG capsule Take 60 mg by mouth daily before breakfast.    [provider]  DULoxetine (CYMBALTA) 60 MG capsule Take 1 capsule (60 mg total) by mouth daily. 08/30/19 02/26/20  Milinda Pointer, MD  ergocalciferol (VITAMIN D2) 1.25 MG (50000 UT) capsule ergocalciferol (vitamin D2) 1,250 mcg (50,000 unit) capsule  Take 1 capsule every week by oral route in the morning.    [provider]  estradiol (ESTRACE) 0.1 MG/GM vaginal cream Place 1 Applicatorful vaginally every Monday, Wednesday, and Friday. Apply 0.5mg  (pea-sized amount)  just inside the vaginal introitus with a finger-tip on Monday, Wednesday and Friday nights, 02/01/19   Zara Council A, PA-C  fluticasone-salmeterol (ADVAIR HFA) 115-21 MCG/ACT inhaler Inhale 2 puffs into the lungs daily. Rinse mouth thoroughly after use 08/16/19   Tyler Pita, MD  hydrochlorothiazide (HYDRODIURIL) 12.5 MG tablet Take 1 tablet (12.5 mg total) by mouth daily. In am 02/12/19   [name],  [name], MD  HYDROcodone-acetaminophen (NORCO/VICODIN) 5-325 MG tablet Take 1 tablet by mouth every 8 (eight) hours as needed for severe pain. Must last 30 days 09/01/19 10/01/19  Milinda Pointer, MD  HYDROcodone-acetaminophen (NORCO/VICODIN) 5-325 MG tablet Take 1 tablet by mouth every 8 (eight) hours as needed for severe pain. Must last 30 days 10/01/19 10/31/19  Milinda Pointer, MD  HYDROcodone-acetaminophen (NORCO/VICODIN) 5-325 MG tablet Take 1 tablet by mouth every 8 (eight) hours as needed for severe pain. Must last 30 days 10/31/19 11/30/19  Milinda Pointer, MD  levocetirizine (XYZAL) 5 MG tablet Take 1 tablet (5 mg total) by mouth every evening. 08/12/18   [name], [name], MD  levothyroxine (SYNTHROID) 175 MCG tablet Take 1 tablet (175 mcg total) by mouth daily before breakfast. 03/10/19   [name], [name], MD  losartan-hydrochlorothiazide (HYZAAR) 50-12.5 MG tablet Take 1 tablet by mouth daily. 02/02/19   [provider]  meloxicam (MOBIC) 15 MG tablet Take 1 tablet (15 mg total) by mouth daily as needed for pain. 09/13/19   [name], [name], MD  metFORMIN (GLUCOPHAGE) 1000 MG tablet Take 1 tablet (1,000 mg total) by mouth 2 (two) times daily. With food 12/14/18   [name], [name], MD  metoprolol succinate (TOPROL-XL) 50 MG 24 hr tablet TAKE 1 TABLET BY MOUTH DAILY 07/05/19   Wellington Hampshire, MD  montelukast (SINGULAIR) 10 MG tablet Take 1 tablet (10 mg total) by mouth at bedtime. 02/03/19   [name], [name], MD  mupirocin ointment (BACTROBAN) 2 % Apply 1 application topically 2 (two) times daily. Skin wounds 10/04/17   [name], [name], MD  nystatin cream (MYCOSTATIN) Apply 1 application topically 2 (two) times daily. Mouth prn for 7 days as needed Patient taking differently: Apply 1 application topically 2 (two) times daily as needed for dry skin. Mouth prn for 7 days as needed 11/17/17   [name], [name], MD  ondansetron  (ZOFRAN) 4 MG tablet Take 1 tablet (4 mg total) by mouth every 8 (eight) hours as needed for nausea or vomiting. 02/14/19   [name], [name], MD  pantoprazole (PROTONIX) 40 MG tablet Take by mouth. 04/07/19 04/06/20  [provider]  PRADAXA 150 MG CAPS capsule TAKE 1 CAPSULE TWICE DAILY 06/05/19   Kathlyn Sacramento  A, MD  pregabalin (LYRICA) 50 MG capsule Take 1 capsule (50 mg total) by mouth 3 (three) times daily. 08/30/19 02/26/20  Milinda Pointer, MD  Respiratory Therapy Supplies (FLUTTER) DEVI Use as directed. 05/09/19   Tyler Pita, MD  sucralfate (CARAFATE) 1 g tablet Take 1 g by mouth 3 (three) times daily. 08/25/19   [provider]  sucralfate (CARAFATE) 1 GM/10ML suspension Take 1 g by mouth 3 (three) times daily. 08/15/19   [provider]  TOVIAZ 8 MG TB24 tablet TAKE ONE TABLET BY MOUTH EVERY DAY 08/07/19   McGowan, [name] A, PA-C  trospium (SANCTURA) 20 MG tablet Take 1 tablet (20 mg total) by mouth 2 (two) times daily. 09/01/19 11/30/19  Debroah Loop, PA-C  zinc oxide 20 % ointment Apply 1 application topically as needed for irritation. 09/02/17   Zara Council A, PA-C    Allergies  Allergen Reactions  . Morphine And Related Other (See Comments) and Nausea Only    Patient becomes very confused Other reaction(s): Other (See Comments), Other (See Comments) Patient becomes very confused Patient becomes very confused   . Penicillins Hives, Shortness Of Breath, Swelling and Other (See Comments)    Facial swelling Has patient had a PCN reaction causing immediate rash, facial/tongue/throat swelling, SOB or lightheadedness with hypotension: Yes Has patient had a PCN reaction causing severe rash involving mucus membranes or skin necrosis: Yes Has patient had a PCN reaction that required hospitalization No Has patient had a PCN reaction occurring within the last 10 years: No If all of the above answers are "NO", then may proceed with  Cephalosporin use.  Other reaction(s): Other (See Comments), Other (See Comments) Facial swelling Has patient had a PCN reaction causing immediate rash, facial/tongue/throat swelling, SOB or lightheadedness with hypotension: Yes Has patient had a PCN reaction causing severe rash involving mucus membranes or skin necrosis: Yes Has patient had a PCN reaction that required hospitalization No Has patient had a PCN reaction occurring within the last 10 years: No If all of the above answers are "NO", then may proceed with Cephalosporin use. Facial swelling Has patient had a PCN reaction causing immediate rash, facial/tongue/throat swelling, SOB or lightheadedness with hypotension: Yes Has patient had a PCN reaction causing severe rash involving mucus membranes or skin necrosis: Yes Has patient had a PCN reaction that required hospitalization No Has patient had a PCN reaction occurring within the last 10 years: No If all of the above answers are "NO", then may proceed with Cephalosporin use. Facial swelling Has patient had a PCN reaction causing immediate rash, facial/tongue/throat swelling, SOB or lightheadedness with hypotension: Yes Has patient had a PCN reaction causing severe rash involving mucus membranes or skin necrosis: Yes Has patient had a PCN reaction that required hospitalization No Has patient had a PCN reaction occurring within the last 10 years: No If all of the above answers are "NO", then may proceed with Cephalosporin use.   . Aspirin Hives  . Oxytetracycline Hives    Other reaction(s): Unknown     Family History  Problem Relation Age of Onset  . Skin cancer Father   . Diabetes Father   . Hypertension Father   . Peripheral vascular disease Father   . Cancer Father   . Cerebral aneurysm Father   . Alcohol abuse Father   . Varicose Veins Mother   . Kidney disease Mother   . Arthritis Mother   . Mental illness Sister   . Cancer Maternal Aunt  breast  . Breast  cancer Maternal Aunt   . Arthritis Maternal Grandmother   . Hypertension Maternal Grandmother   . Diabetes Maternal Grandmother   . Arthritis Maternal Grandfather   . Heart disease Maternal Grandfather   . Hypertension Maternal Grandfather   . Arthritis Paternal Grandmother   . Hypertension Paternal Grandmother   . Diabetes Paternal Grandmother   . Arthritis Paternal Grandfather   . Hypertension Paternal Grandfather   . Bladder Cancer Neg Hx   . Kidney cancer Neg Hx     Social History Social History   Tobacco Use  . Smoking status: Never Smoker  . Smokeless tobacco: Never Used  Vaping Use  . Vaping Use: Never used  Substance Use Topics  . Alcohol use: No    Alcohol/week: 0.0 standard drinks  . Drug use: Never    Review of Systems Constitutional: Febrile to 100.8. Cardiovascular: Negative for chest pain. Respiratory: Mild shortness of breath.  Positive for cough. Gastrointestinal: Negative for abdominal pain, vomiting and diarrhea. Genitourinary: Mild dysuria Musculoskeletal: Negative for musculoskeletal complaints Neurological: Negative for headache All other ROS negative  ____________________________________________   PHYSICAL EXAM:  VITAL SIGNS: ED Triage Vitals  Enc Vitals Group     BP 09/02/19 1034 (!) 156/78     Pulse Rate 09/02/19 1034 80     Resp 09/02/19 1034 (!) 23     Temp 09/02/19 1034 99.7 F (37.6 C)     Temp Source 09/02/19 1034 Oral     SpO2 09/02/19 1034 93 %     Weight 09/02/19 1058 252 lb (114.3 kg)     Height 09/02/19 1058 5\' 5"  (1.651 m)     Head Circumference --      Peak Flow --      Pain Score 09/02/19 1057 5     Pain Loc --      Pain Edu? --      Excl. in [place]? --    Constitutional: Alert and oriented. Well appearing and in no distress. Eyes: Normal exam ENT      Head: Normocephalic and atraumatic.      Mouth/Throat: Mucous membranes are moist. Cardiovascular: Normal rate, regular rhythm.  Respiratory: Mild tachypnea.  Mild  expiratory wheeze bilaterally. Gastrointestinal: Soft and nontender. No distention.   Musculoskeletal: Nontender with normal range of motion in all extremities. Neurologic:  Normal speech and language. No gross focal neurologic deficits  Skin:  Skin is warm, dry and intact.  Psychiatric: Mood and affect are normal.  ____________________________________________    EKG  EKG viewed and interpreted by myself shows a sinus rhythm at 79 bpm with a narrow QRS, normal axis, normal intervals, nonspecific but no concerning ST changes.  ____________________________________________    RADIOLOGY  Chest x-ray shows no edema or opacity.  ____________________________________________   INITIAL IMPRESSION / ASSESSMENT AND PLAN / ED COURSE  Pertinent labs & imaging results that were available during my care of the patient were reviewed by me and considered in my medical decision making (see chart for details).   Patient presents emergency department for shortness of breath weakness and fever.  Febrile per EMS to 100.8 received 975 of acetaminophen, 99.7 temperature in the emergency department.  Patient is hypoxic in the 80s on room air, placed on 2 L and satting in the mid 90s, no baseline O2 requirement.  Son states since last night the patient has been somewhat confused at times as well.  Patient is mildly tachypneic with borderline temperature and a  slightly elevated white blood cell count meeting sepsis criteria.  We will check labs, cultures, start on broad-spectrum antibiotics.  Given the patient's likely COPD exacerbation we will also dose duo nebs and Solu-Medrol.  We will continue to closely monitor the patient in the emergency department but I do anticipate likely admission to the hospital service once her ED work-up is been completed.  Troponin has resulted elevated at 120.  We will also cover with heparin as a precaution.  Patient admitted to the hospital service for further  treatment.  ORELIA BRANDSTETTER was evaluated in Emergency Department on 09/02/2019 for the symptoms described in the history of present illness. She was evaluated in the context of the global COVID-19 pandemic, which necessitated consideration that the patient might be at risk for infection with the SARS-CoV-2 virus that causes COVID-19. Institutional protocols and algorithms that pertain to the evaluation of patients at risk for COVID-19 are in a state of rapid change based on information released by regulatory bodies including the CDC and federal and state organizations. These policies and algorithms were followed during the patient's care in the ED.  CRITICAL CARE Performed by: Harvest Dark   Total critical care time: 45 minutes  Critical care time was exclusive of separately billable procedures and treating other patients.  Critical care was necessary to treat or prevent imminent or life-threatening deterioration.  Critical care was time spent personally by me on the following activities: development of treatment plan with patient and/or surrogate as well as nursing, discussions with consultants, evaluation of patient's response to treatment, examination of patient, obtaining history from patient or surrogate, ordering and performing treatments and interventions, ordering and review of laboratory studies, ordering and review of radiographic studies, pulse oximetry and re-evaluation of patient's condition.   ____________________________________________   FINAL CLINICAL IMPRESSION(S) / ED DIAGNOSES  COPD exacerbation Sepsis Weakness  NSTEMI   Harvest Dark, MD 09/02/19 1210

## 2019-09-02 NOTE — ED Notes (Signed)
Patient spoke with RN Jackelyn Poling, RN stated she had not gone through the chart completely. RN informed RN of policy and will call back in 15 minutes and if still not ready patient will be transported up regardless.

## 2019-09-02 NOTE — Progress Notes (Signed)
Patient admitted to room 256 alert and oriented accompanied by husband. Has multiple abrasions on lower legs and has rash under right breast and right panniculus

## 2019-09-03 ENCOUNTER — Inpatient Hospital Stay (HOSPITAL_COMMUNITY)
Admit: 2019-09-03 | Discharge: 2019-09-03 | Disposition: A | Discharge status: Home / Self Care | Payer: PPO | Attending: Internal Medicine | Admitting: Internal Medicine

## 2019-09-03 ENCOUNTER — Inpatient Hospital Stay: Payer: PPO

## 2019-09-03 DIAGNOSIS — N1831 Chronic kidney disease, stage 3a: Secondary | ICD-10-CM

## 2019-09-03 DIAGNOSIS — R0602 Shortness of breath: Secondary | ICD-10-CM

## 2019-09-03 DIAGNOSIS — I482 Chronic atrial fibrillation, unspecified: Secondary | ICD-10-CM

## 2019-09-03 DIAGNOSIS — J181 Lobar pneumonia, unspecified organism: Secondary | ICD-10-CM

## 2019-09-03 DIAGNOSIS — Z6841 Body Mass Index (BMI) 40.0 and over, adult: Secondary | ICD-10-CM

## 2019-09-03 DIAGNOSIS — R7881 Bacteremia: Secondary | ICD-10-CM

## 2019-09-03 DIAGNOSIS — B955 Unspecified streptococcus as the cause of diseases classified elsewhere: Secondary | ICD-10-CM

## 2019-09-03 DIAGNOSIS — J9601 Acute respiratory failure with hypoxia: Secondary | ICD-10-CM

## 2019-09-03 LAB — URINALYSIS, COMPLETE (UACMP) WITH MICROSCOPIC
Bilirubin Urine: NEGATIVE
Glucose, UA: 50 mg/dL — AB
Hgb urine dipstick: NEGATIVE
Ketones, ur: NEGATIVE mg/dL
Leukocytes,Ua: NEGATIVE
Nitrite: NEGATIVE
Protein, ur: NEGATIVE mg/dL
Specific Gravity, Urine: 1.019 (ref 1.005–1.030)
pH: 5 (ref 5.0–8.0)

## 2019-09-03 LAB — BLOOD CULTURE ID PANEL (REFLEXED)
Acinetobacter baumannii: NOT DETECTED
Candida albicans: NOT DETECTED
Candida glabrata: NOT DETECTED
Candida krusei: NOT DETECTED
Candida parapsilosis: NOT DETECTED
Candida tropicalis: NOT DETECTED
Enterobacter cloacae complex: NOT DETECTED
Enterobacteriaceae species: NOT DETECTED
Enterococcus species: NOT DETECTED
Escherichia coli: NOT DETECTED
Haemophilus influenzae: NOT DETECTED
Klebsiella oxytoca: NOT DETECTED
Klebsiella pneumoniae: NOT DETECTED
Listeria monocytogenes: NOT DETECTED
Neisseria meningitidis: NOT DETECTED
Proteus species: NOT DETECTED
Pseudomonas aeruginosa: NOT DETECTED
Serratia marcescens: NOT DETECTED
Staphylococcus aureus (BCID): NOT DETECTED
Staphylococcus species: NOT DETECTED
Streptococcus agalactiae: DETECTED — AB
Streptococcus pneumoniae: NOT DETECTED
Streptococcus pyogenes: NOT DETECTED
Streptococcus species: DETECTED — AB

## 2019-09-03 LAB — BASIC METABOLIC PANEL
Anion gap: 9 (ref 5–15)
BUN: 25 mg/dL — ABNORMAL HIGH (ref 8–23)
CO2: 26 mmol/L (ref 22–32)
Calcium: 8.3 mg/dL — ABNORMAL LOW (ref 8.9–10.3)
Chloride: 100 mmol/L (ref 98–111)
Creatinine, Ser: 1.25 mg/dL — ABNORMAL HIGH (ref 0.44–1.00)
GFR calc Af Amer: 51 mL/min — ABNORMAL LOW (ref >60)
GFR calc non Af Amer: 44 mL/min — ABNORMAL LOW (ref >60)
Glucose, Bld: 275 mg/dL — ABNORMAL HIGH (ref 70–99)
Potassium: 3.8 mmol/L (ref 3.5–5.1)
Sodium: 135 mmol/L (ref 135–145)

## 2019-09-03 LAB — CBC
HCT: 31.2 % — ABNORMAL LOW (ref 36.0–46.0)
Hemoglobin: 9.7 g/dL — ABNORMAL LOW (ref 12.0–15.0)
MCH: 26.5 pg (ref 26.0–34.0)
MCHC: 31.1 g/dL (ref 30.0–36.0)
MCV: 85.2 fL (ref 80.0–100.0)
Platelets: 196 10*3/uL (ref 150–400)
RBC: 3.66 MIL/uL — ABNORMAL LOW (ref 3.87–5.11)
RDW: 15.5 % (ref 11.5–15.5)
WBC: 7.8 10*3/uL (ref 4.0–10.5)
nRBC: 0 % (ref 0.0–0.2)

## 2019-09-03 LAB — GLUCOSE, CAPILLARY
Glucose-Capillary: 254 mg/dL — ABNORMAL HIGH (ref 70–99)
Glucose-Capillary: 274 mg/dL — ABNORMAL HIGH (ref 70–99)
Glucose-Capillary: 108 mg/dL — ABNORMAL HIGH (ref 70–99)
Glucose-Capillary: 189 mg/dL — ABNORMAL HIGH (ref 70–99)

## 2019-09-03 LAB — ECHOCARDIOGRAM COMPLETE
Height: 65 in
Weight: 4052.8 oz

## 2019-09-03 LAB — HEPARIN LEVEL (UNFRACTIONATED)
Heparin Unfractionated: 0.38 IU/mL (ref 0.30–0.70)
Heparin Unfractionated: 0.34 IU/mL (ref 0.30–0.70)

## 2019-09-03 LAB — HIV ANTIBODY (ROUTINE TESTING W REFLEX): HIV Screen 4th Generation wRfx: NONREACTIVE

## 2019-09-03 MED ORDER — IPRATROPIUM-ALBUTEROL 0.5-2.5 (3) MG/3ML IN SOLN
3.0000 mL | Freq: Two times a day (BID) | RESPIRATORY_TRACT | Status: DC
  Administered 2019-09-03 – 2019-09-08 (×9): 3 mL via RESPIRATORY_TRACT
  Filled 2019-09-03 (×10): qty 3

## 2019-09-03 MED ORDER — SODIUM CHLORIDE 0.9 % IV SOLN
500.0000 mg | INTRAVENOUS | Status: DC
  Administered 2019-09-03 – 2019-09-04 (×2): 500 mg via INTRAVENOUS
  Filled 2019-09-03 (×2): qty 500

## 2019-09-03 MED ORDER — DABIGATRAN ETEXILATE MESYLATE 150 MG PO CAPS
150.0000 mg | ORAL_CAPSULE | Freq: Two times a day (BID) | ORAL | Status: DC
  Administered 2019-09-03 – 2019-09-08 (×10): 150 mg via ORAL
  Filled 2019-09-03 (×12): qty 1

## 2019-09-03 NOTE — Progress Notes (Addendum)
PROGRESS NOTE    [name]  OXB:[id number] DOB: 1951-01-08 DOA: 09/02/2019 PCP: [name], [name], MD    Chief Complaint  Patient presents with  . Weakness  . Fatigue  . Shortness of Breath    Brief Narrative:   PCP: [name], [name], MD   Patient coming from: Home  I have personally briefly reviewed patient's old medical records in [place]  Chief Complaint: Weakness, fatigue  HPI: [name] is a 69 y.o. adult with medical history significant for anxiety, depression, diabetes mellitus, COPD, hypertension, morbid obesity who presents to the emergency room for evaluation of generalized weakness and shortness of breath for 2 days.  Patient states that she had been feeling unwell for about 2 days and it was mostly shortness of breath with exertion but on the night prior to admission when she started feeling very weak associated with chills and a cough productive of yellow phlegm.  Patient also states that she had mild dysuria and was seen by urology as an outpatient and was told her urine was sterile. On the morning of her admission she was very weak and unable to get up out of bed.  Her husband called EMS due to worsening weakness Per EMS patient had a temp of 100.8 when they arrived and she received Tylenol 975 mg x 1 dose as well as albuterol treatment.  Patient was noted to be hypoxic by EMS and her pulse oximetry on room air was in the low 80s.  Patient was placed on oxygen supplementation with improvement in her pulse oximetry. Her labs done today showed a serum troponin of 120, white count of 12.2 with a left shift as well as pyuria. Chest x-ray shows no acute findings Twelve-lead EKG shows minimal ST depression in the inferior leads.  ED Course: 69 year old female seen in the emergency room for evaluation of shortness of breath and weakness.  Patient was noted to have a low-grade temp with a T-max of 100.2F.  She was also hypoxic in the  field and has pyuria. Troponin is elevated and she has been started on a heparin drip.  Subjective:  She denies pain, no fever, I did not notice cough during encounter, she is on room air no hypoxia She is complaining of left foot wound  Assessment & Plan:   Principal Problem:   CAP (community acquired pneumonia) Active Problems:   Paroxysmal atrial fibrillation ([place]); CHA2DS2-Vasc Score 5. On Pradaxa   Essential hypertension   Hypothyroidism, unspecified   Anxiety and depression   Morbid obesity with BMI of 40.0-44.9, adult (HCC)   Acute lower UTI   Physical deconditioning   Elevated troponin   Acute on chronic respiratory failure with hypoxia (HCC)  Sepsis present on admission, with leukocytosis W BC 12.2, lobar pneumonia (right middle lobe ), acute hypoxic respiratory failure, bacteremia and possible UTI - T-max of 100.2F.  Patient was noted to be hypoxic by EMS and her pulse oximetry on room air was in the low 80s.  Patient was placed on oxygen supplementation with improvement in her pulse oximetry. -SARS-CoV-2 screening negative ,blood culture grow gram-positive cocci, echocardiogram performed result pending -Continue Rocephin and Zithromax for now, follow-up on cultures, will need to repeat blood culture tomorrow   Mild elevation of troponin likely demand ischemia from sepsis Echo obtained, result pending Cardiology input appreciated  Noninsulin-dependent type 2 diabetes, with hyperglycemia, fasting blood glucose 275 this morning A1c 6.8 Home medication Metformin held, on SSI here  pyuria Obtain urine culture Continue antibiotics  CKD 3 a, anemia of chronic disease Creatinine appears at baseline Renal dosing meds  Chronic A. Fib, rate controlled Toprol-XL and Pradaxa  Hypertension On Toprol-XL, losartan HCTZ  Hypothyroidism Continue Synthroid  Chronic left foot (heal) wound, right heal pain/erythema She follow closely with local podiatrist Podiatry Dr  Luana Shu  consulted  Who recommend x ray Dr Luana Shu will see patient on monday Will check esr/crp as well  Failure to thrive, will get PT eval, likely  need short-term rehab  Husband is concerned about patient has been having intermittent hallucination for a year or so, per chart review, she had a CT of the head in May 2020 and that was normal, husband is wondering Xanax can cause intermittent hallucination, he is concerned that patient is on Xanax with unsteady feet, plan to taper Xanax and follow up with pcp.  Class 3 obesity Body mass index is 42.15 kg/m.   DVT prophylaxis: On Pradaxa, SCD Code Status: Full Family Communication: Patient Disposition:   Status is: Inpatient    Dispo: The patient is from:               Anticipated d/c is to:               Anticipated d/c date is:               Patient currently   Consultants:   Cardiology  Procedures:   None  Antimicrobials:   Rocephin and Zithromax     Objective: Vitals:   09/02/19 1958 09/02/19 2037 09/03/19 0440 09/03/19 0739  BP:  112/87 129/66 136/66  Pulse:  75 81 87  Resp:  _0 Temp:  98.4 F (36.9 C) 97.9 F (36.6 C) 98.4 F (36.9 C)  TempSrc:  Oral Oral   SpO2: 100% 99% 96% 95%  Weight:   114.9 kg   Height:        Intake/Output Summary (Last 24 hours) at 09/03/2019 1053 Last data filed at 09/03/2019 0939 Gross per 24 hour  Intake 395.62 ml  Output 550 ml  Net -154.38 ml   Filed Weights   09/02/19 1058 09/03/19 0440  Weight: 114.3 kg 114.9 kg    Examination:  General exam: calm, NAD Respiratory system: Diminished lung sounds , no wheezing no rales no rhonchi  Respiratory effort normal. Cardiovascular system: S1 & S2 heard, IRRR. No JVD, no murmur, No pedal edema. Gastrointestinal system: Abdomen is nondistended, soft and nontender. No organomegaly or masses felt. Normal bowel sounds heard. Central nervous system: Alert and oriented. No focal neurological deficits. Extremities: Symmetric  5 x 5 power.   Skin: Chronic left heel wound, several abrasions bilateral lower legs Psychiatry: Judgement and insight appear normal. Mood & affect appropriate.     Data Reviewed: I have personally reviewed following labs and imaging studies  CBC: Recent Labs  Lab 09/02/19 1044 09/03/19 0238  WBC 12.2* 7.8  NEUTROABS 10.9*  --   HGB 10.2* 9.7*  HCT 32.6* 31.2*  MCV 84.2 85.2  PLT 251 272    Basic Metabolic Panel: Recent Labs  Lab 09/02/19 1044 09/03/19 0238  NA 136 135  K 4.0 3.8  CL 100 100  CO2 25 26  GLUCOSE 237* 275*  BUN 21 25*  CREATININE 1.26* 1.25*  CALCIUM 8.5* 8.3*    GFR: Estimated Creatinine Clearance (by C-G formula based on SCr of 1.25 mg/dL (H)) Female: 53.8 mL/min (A) Female: 65.4 mL/min (A)  Liver Function Tests: Recent Labs  Lab 09/02/19 1044  AST 13*  ALT 9  ALKPHOS 74  BILITOT 0.7  PROT 6.8  ALBUMIN 3.4*    CBG: Recent Labs  Lab 09/02/19 1602 09/02/19 2127 09/03/19 0740  GLUCAP 228* 282* 254*     Recent Results (from the past 240 hour(s))  Microscopic Examination     Status: Abnormal   Collection Time: 09/01/19  2:20 PM   Urine  Result Value Ref Range Status   WBC, UA >30 (H) 0 - 5 /hpf Final   RBC None seen 0 - 2 /hpf Final   Epithelial Cells (non renal) 0-10 0 - 10 /hpf Final   Bacteria, UA Many (A) None seen/Few Final  Culture, blood (Routine x 2)     Status: None (Preliminary result)   Collection Time: 09/02/19 10:44 AM   Specimen: BLOOD  Result Value Ref Range Status   Specimen Description BLOOD R UPPER ARM  Final   Special Requests   Final    BOTTLES DRAWN AEROBIC AND ANAEROBIC Blood Culture adequate volume   Culture  Setup Time   Final    Organism ID to follow IN BOTH AEROBIC AND ANAEROBIC BOTTLES GRAM POSITIVE COCCI CRITICAL RESULT CALLED TO, READ BACK BY AND VERIFIED WITH: SCOTT HALL ON 09/03/19 AT [facility] Performed at [facility], [address] [place], [place] [address]    Culture GRAM  POSITIVE COCCI  Final   Report Status PENDING  Incomplete  Culture, blood (Routine x 2)     Status: None (Preliminary result)   Collection Time: 09/02/19 10:44 AM   Specimen: BLOOD  Result Value Ref Range Status   Specimen Description BLOOD RFA  Final   Special Requests   Final    BOTTLES DRAWN AEROBIC AND ANAEROBIC Blood Culture adequate volume   Culture  Setup Time   Final    GRAM POSITIVE COCCI AEROBIC BOTTLE ONLY CRITICAL VALUE NOTED.  VALUE IS CONSISTENT WITH PREVIOUSLY REPORTED AND CALLED VALUE. Performed at [facility], [address] [place],  [address]    Culture [facility] POSITIVE COCCI  Final   Report Status PENDING  Incomplete  Blood Culture ID Panel (Reflexed)     Status: Abnormal   Collection Time: 09/02/19 10:44 AM  Result Value Ref Range Status   Enterococcus species NOT DETECTED NOT DETECTED Final   Listeria monocytogenes NOT DETECTED NOT DETECTED Final   Staphylococcus species NOT DETECTED NOT DETECTED Final   Staphylococcus aureus (BCID) NOT DETECTED NOT DETECTED Final   Streptococcus species DETECTED (A) NOT DETECTED Final    Comment: CRITICAL RESULT CALLED TO, READ BACK BY AND VERIFIED WITH: SCOTT HALL ON 09/03/19 AT 0437 [place]    Streptococcus agalactiae DETECTED (A) NOT DETECTED Final    Comment: CRITICAL RESULT CALLED TO, READ BACK BY AND VERIFIED WITH: SCOTT HALL ON 09/03/19 AT 0437 [place]    Streptococcus pneumoniae NOT DETECTED NOT DETECTED Final   Streptococcus pyogenes NOT DETECTED NOT DETECTED Final   Acinetobacter baumannii NOT DETECTED NOT DETECTED Final   Enterobacteriaceae species NOT DETECTED NOT DETECTED Final   Enterobacter cloacae complex NOT DETECTED NOT DETECTED Final   Escherichia coli NOT DETECTED NOT DETECTED Final   Klebsiella oxytoca NOT DETECTED NOT DETECTED Final   Klebsiella pneumoniae NOT DETECTED NOT DETECTED Final   Proteus species NOT DETECTED NOT DETECTED Final   Serratia marcescens NOT DETECTED NOT DETECTED Final    Haemophilus influenzae NOT DETECTED NOT DETECTED Final  Neisseria meningitidis NOT DETECTED NOT DETECTED Final   Pseudomonas aeruginosa NOT DETECTED NOT DETECTED Final   Candida albicans NOT DETECTED NOT DETECTED Final   Candida glabrata NOT DETECTED NOT DETECTED Final   Candida krusei NOT DETECTED NOT DETECTED Final   Candida parapsilosis NOT DETECTED NOT DETECTED Final   Candida tropicalis NOT DETECTED NOT DETECTED Final    Comment: Performed at [facility], [address] [place], [place] [address]  SARS Coronavirus 2 by RT PCR (hospital order, performed in [place] hospital lab) Nasopharyngeal Nasopharyngeal Swab     Status: None   Collection Time: 09/02/19  3:28 PM   Specimen: Nasopharyngeal Swab  Result Value Ref Range Status   SARS Coronavirus 2 NEGATIVE NEGATIVE Final    Comment: (NOTE) SARS-CoV-2 target nucleic acids are NOT DETECTED.  The SARS-CoV-2 RNA is generally detectable in upper and lower respiratory specimens during the acute phase of infection. The lowest concentration of SARS-CoV-2 viral copies this assay can detect is 250 copies / mL. A negative result does not preclude SARS-CoV-2 infection and should not be used as the sole basis for treatment or other patient management decisions.  A negative result may occur with improper specimen collection / handling, submission of specimen other than nasopharyngeal swab, presence of viral mutation(s) within the areas targeted by this assay, and inadequate number of viral copies (<250 copies / mL). A negative result must be combined with clinical observations, patient history, and epidemiological information.  Fact Sheet for Patients:   StrictlyIdeas.no  Fact Sheet for Healthcare Providers: BankingDealers.co.za  This test is not yet approved or  cleared by the [place] FDA and has been authorized for detection and/or diagnosis of SARS-CoV-2 by FDA under  an Emergency Use Authorization (EUA).  This EUA will remain in effect (meaning this test can be used) for the duration of the COVID-19 declaration under Section 564(b)(1) of the Act, 21 U.S.C. section 360bbb-3(b)(1), unless the authorization is terminated or revoked sooner.  Performed at [facility], [address] [place], [place] [address]          Radiology Studies: CT Angio Chest PE W and/or Wo Contrast  Result Date: 09/02/2019 CLINICAL DATA:  Shortness of breath EXAM: CT ANGIOGRAPHY CHEST WITH CONTRAST TECHNIQUE: Multidetector CT imaging of the chest was performed using the standard protocol during bolus administration of intravenous contrast. Multiplanar CT image reconstructions and MIPs were obtained to evaluate the vascular anatomy. CONTRAST:  57m OMNIPAQUE IOHEXOL 350 MG/ML SOLN COMPARISON:  07/28/2018 FINDINGS: Cardiovascular: Satisfactory opacification of the pulmonary arteries to the segmental level. No evidence of pulmonary embolism. Cardiomegaly. No pericardial effusion. Mediastinum/Nodes: No enlarged mediastinal, hilar, or axillary lymph nodes. Thyroid gland, trachea, and esophagus demonstrate no significant findings. Lungs/Pleura: Bandlike scarring of the right lung base with elevation of the right hemidiaphragm. Heterogeneous airspace opacity of the right middle lobe (series 6, image 50). No pleural effusion or pneumothorax. Upper Abdomen: No acute abnormality. Partially imaged findings of partial gastrectomy in the included upper abdomen. Musculoskeletal: No chest wall abnormality. No acute or significant osseous findings. Review of the MIP images confirms the above findings. IMPRESSION: 1. Negative examination for pulmonary embolism. 2. Heterogeneous airspace opacity of the right middle lobe concerning for infection. 3. Cardiomegaly. Electronically Signed   By: AEddie CandleM.D.   On: 09/02/2019 13:32   DG Chest Port 1 View  Result Date: 09/02/2019 CLINICAL DATA:   Altered mental status with weakness EXAM: PORTABLE CHEST 1 VIEW COMPARISON:  Chest  radiograph and chest CT Jul 28, 2018 FINDINGS: There is stable elevation of the right hemidiaphragm. No edema or airspace opacity. Heart size and pulmonary vascularity normal. No adenopathy. There is a total shoulder replacement on the right. IMPRESSION: Elevation right hemidiaphragm, stable. No edema or airspace opacity. Stable cardiac silhouette. No adenopathy. Electronically Signed   By: Lowella Grip III M.D.   On: 09/02/2019 11:30        Scheduled Meds: . buPROPion  150 mg Oral Daily  . dabigatran  150 mg Oral Q12H  . DULoxetine  60 mg Oral Daily  . [START ON 09/04/2019] estradiol  1 Applicatorful Vaginal Q M,W,F  . fesoterodine  8 mg Oral Daily  . losartan  50 mg Oral Daily   And  . hydrochlorothiazide  12.5 mg Oral Daily  . insulin aspart  0-20 Units Subcutaneous TID WC  . ipratropium-albuterol  3 mL Nebulization BID  . levothyroxine  175 mcg Oral QAC breakfast  . loratadine  10 mg Oral Daily  . metoprolol succinate  50 mg Oral Daily  . mometasone-formoterol  2 puff Inhalation BID  . montelukast  10 mg Oral QHS  . nystatin   Topical BID  . pantoprazole  40 mg Oral Daily  . pregabalin  50 mg Oral TID  . sodium chloride flush  3 mL Intravenous Q12H  . [START ON 09/08/2019] Vitamin D (Ergocalciferol)  50,000 Units Oral Weekly   Continuous Infusions: . sodium chloride    . azithromycin    . cefTRIAXone (ROCEPHIN)  IV       LOS: 1 day     Time spent: 28mns I have personally reviewed and interpreted on  09/03/2019 daily labs, tele strips, imagings as discussed above under date review session and assessment and plans.  I reviewed all nursing notes, pharmacy notes, consultant notes,  vitals, pertinent old records  I have discussed plan of care as described above with RN , patient on 09/03/2019  Voice Recognition /Dragon dictation system was used to create this note, attempts have been  made to correct errors. Please contact the author with questions and/or clarifications.   FFlorencia Reasons MD PhD FACP [facility]  Available via Epic secure chat 7am-7pm for nonurgent issues Please page for urgent issues To page the attending provider between 7A-7P or the covering provider during after hours 7P-7A, please log into the web site [email or web address] and access using universal [place] password for that web site. If you do not have the password, please call the hospital operator.    09/03/2019, 10:53 AM

## 2019-09-03 NOTE — Progress Notes (Signed)
*  PRELIMINARY RESULTS* Echocardiogram 2D Echocardiogram has been performed.  Lavell Luster Clotiel Troop 09/03/2019, 9:14 AM

## 2019-09-03 NOTE — Consult Note (Addendum)
ANTICOAGULATION CONSULT NOTE - Pharmacy Consult for Heparin Infusion Indication: chest pain/ACS  Patient Measurements: Height: 5\' 5"  (165.1 cm) Weight: 114.3 kg (252 lb) IBW/kg (Calculated) : 57 Heparin Dosing Weight: 84.2 kg  Vital Signs: Temp: 98.4 F (36.9 C) (06/12 2037) Temp Source: Oral (06/12 2037) BP: 112/87 (06/12 2037) Pulse Rate: 75 (06/12 2037)  Labs: Recent Labs    09/02/19 1044 09/02/19 1235 09/02/19 1430 09/02/19 1557 09/02/19 1849 09/03/19 0238 09/03/19 0330  HGB 10.2*  --   --   --   --  9.7*  --   HCT 32.6*  --   --   --   --  31.2*  --   PLT 251  --   --   --   --  196  --   APTT  --  50*  --   --   --   --   --   LABPROT  --  17.4*  --   --   --   --   --   INR  --  1.5*  --   --   --   --   --   HEPARINUNFRC  --   --   --   --  0.22*  --  0.38  CREATININE 1.26*  --   --   --   --  1.25*  --   TROPONINIHS 120*  --  171* 143*  --   --   --     Estimated Creatinine Clearance (by C-G formula based on SCr of 1.25 mg/dL (H)) Female: 53.6 mL/min (A) Female: 65.2 mL/min (A)   Medical History: Past Medical History:  Diagnosis Date  . Abnormal antibody titer   . Anxiety and depression   . Arthritis   . Asthma   . Cystocele   . Depression   . Diabetes ([place])   . DVT (deep venous thrombosis) (HCC)    Righ calf  . Dyspnea    11/08/2018 - "more now due to lack of exercise"  . Dysrhythmia    afib  . Edema   . GERD (gastroesophageal reflux disease)   . Heart murmur    Echocardiogram June 2015: Mild MR (possible vegetation seen on TTE, not seen on TEE), normal LV size with moderate concentric LVH. Normal function EF 60-65%. Normal diastolic function. Mild LA dilation.  Marland Kitchen History of IBS   . History of kidney stones   . History of methicillin resistant staphylococcus aureus (MRSA) 2008  . Hyperlipidemia   . Hypertension   . Hypothyroidism   . Insomnia   . Kidney stone    kidney stones with lithotripsy .  [facility]- "adrenal glands"  .  Neuropathy involving both lower extremities   . Obesity, Class II, BMI 35-39.9, with comorbidity   . Paroxysmal atrial fibrillation ([place]) 2007   In June 2015, Cardiac Event Monitor: Mostly SR/sinus arrhythmia with PVCs that are frequent. Short bursts of A. fib lasting several minutes;; CHA2DS2-VASc Score = 5 (age, Female, PVD, DM, HTN)  . Peripheral vascular disease ([place])   . Sleep apnea    use C-PAP  . Urinary incontinence   . Venous stasis dermatitis of both lower extremities     Medications:  Dabigatran 150 mg BID prior to admission. Patient reported last dose was 6/11 @ 2230  Assessment: Patient is a 69 y/o F with medical history including atrial fibrillation on anticoagulation with dabigatran, peripheral vascular disease, T2DM, COPD who presented to the ED with shortness  of breath, subjective fever, and generalized fatigue/weakness. Pharmacy has been consulted to initiate heparin infusion for NSTEMI.   Baseline CBC with Hgb 10.2, platelets within normal limits. Baseline aPTT elevated to 50s and INR elevated to 1.5.   Goal of Therapy:  Heparin level 0.3-0.7 units/ml Monitor platelets by anticoagulation protocol: Yes   Plan:  -06/13@ 0330 HL 0.38, therapeutic x 1. CBC relatively stable.  Will continue heparin at 1100 units/hr.  Will check HL in 6 hours to confirm -Daily CBC per protocol  Ena Dawley, PharmD Clinical Pharmacist 09/03/2019 4:12 AM

## 2019-09-03 NOTE — Consult Note (Signed)
[facility] Nurse Consult Note: Reason for Consult: Bilateral heel wounds. Currently followed in community for left heel wound by Podiatric Medicine, specifically Dr. [name]. Using Regranex, a non formulary prescriptive. Wound type: Chronic, nonhealing  I have discussed with Dr. Erlinda Hong via Secure chat and recommended consultation with Podiatry while in house to guide care using either a formulary item or to instruct the patient to have a family member bring in her Regranex and dress wounds per their instructions.    She has consulted with Dr. Luana Shu and he has acknowledged the request for consult.  [facility] nursing team did not see and will not follow. Thank you. Maudie Flakes, MSN, RN, [place], [facility]  Pager# [phone]

## 2019-09-03 NOTE — Consult Note (Addendum)
Cardiology Consultation:   Patient ID: [name] MRN: [id number]; DOB: 04/12/1950  Admit date: 09/02/2019 Date of Consult: 09/03/2019  Primary Care Provider: [name], [name], MD [facility] Cardiologist: Kathlyn Sacramento, MD  [place] Electrophysiologist:  None    Patient Profile:   [name] is a 69 y.o. adult with a hx of COPD, diabetes, hypertension who is being seen today for the evaluation of elevated troponins at the request of Dr. Francine Graven.  History of Present Illness:   [name] is a 69 year old female with history of anxiety, diabetes, COPD, hypertension, paroxysmal A. fib on Pradaxa presenting to the hospital due to weakness.  Patient states having an acute episode of weakness 2 days ago not being able to get up from her bed.  Her husband told her she sounded incoherent.  Due to her size, EMS was called to help bringing patient to the hospital.  She endorses a coughing for the past 5 to 6 days with yellow phlegm, chills.  Upon EMS arrival, patient noted to be febrile at 100.8.  She denies chest pain or palpitations.  In the ED, chest CT was negative for PE, right middle lobe opacity was found.  Patient diagnosed with pneumonia and started on antibiotics.  Her symptoms have improved since starting the antibiotics.  Troponins were obtained with levels of 120, 170, 143.  Patient was started on heparin drip.  Creatinine was 1.25.  Echocardiogram was ordered.   Past Medical History:  Diagnosis Date   Abnormal antibody titer    Anxiety and depression    Arthritis    Asthma    Cystocele    Depression    Diabetes ([place])    DVT (deep venous thrombosis) (HCC)    Righ calf   Dyspnea    11/08/2018 - "more now due to lack of exercise"   Dysrhythmia    afib   Edema    GERD (gastroesophageal reflux disease)    Heart murmur    Echocardiogram June 2015: Mild MR (possible vegetation seen on TTE, not seen on TEE), normal LV size with moderate  concentric LVH. Normal function EF 60-65%. Normal diastolic function. Mild LA dilation.   History of IBS    History of kidney stones    History of methicillin resistant staphylococcus aureus (MRSA) 2008   Hyperlipidemia    Hypertension    Hypothyroidism    Insomnia    Kidney stone    kidney stones with lithotripsy .  [facility]- "adrenal glands"   Neuropathy involving both lower extremities    Obesity, Class II, BMI 35-39.9, with comorbidity    Paroxysmal atrial fibrillation ([place]) 2007   In June 2015, Cardiac Event Monitor: Mostly SR/sinus arrhythmia with PVCs that are frequent. Short bursts of A. fib lasting several minutes;; CHA2DS2-VASc Score = 5 (age, Female, PVD, DM, HTN)   Peripheral vascular disease ([place])    Sleep apnea    use C-PAP   Urinary incontinence    Venous stasis dermatitis of both lower extremities     Past Surgical History:  Procedure Laterality Date   ANKLE SURGERY Right    APPLICATION OF WOUND VAC Left 06/27/2015   Procedure: APPLICATION OF WOUND VAC ( POSSIBLE ) ;  Surgeon: [name], MD;  Location: [facility];  Service: Vascular;  Laterality: Left;   APPLICATION OF WOUND VAC Left 09/25/2016   Procedure: APPLICATION OF WOUND VAC;  Surgeon: [name], DPM;  Location: [facility];  Service: Podiatry;  Laterality: Left;  arm surgery     right     CHOLECYSTECTOMY     COLONOSCOPY     COLONOSCOPY WITH PROPOFOL N/A 01/11/2017   Procedure: COLONOSCOPY WITH PROPOFOL;  Surgeon: [name], MD;  Location: [facility] ENDOSCOPY;  Service: Endoscopy;  Laterality: N/A;   COLONOSCOPY WITH PROPOFOL N/A 02/22/2017   Procedure: COLONOSCOPY WITH PROPOFOL;  Surgeon: [name], MD;  Location: [facility] ENDOSCOPY;  Service: Endoscopy;  Laterality: N/A;   COLONOSCOPY WITH PROPOFOL N/A 05/31/2017   Procedure: COLONOSCOPY WITH PROPOFOL;  Surgeon: [name], MD;  Location[facility];  Service: Endoscopy;  Laterality: N/A;    ESOPHAGOGASTRODUODENOSCOPY (EGD) WITH PROPOFOL N/A 01/11/2017   Procedure: ESOPHAGOGASTRODUODENOSCOPY (EGD) WITH PROPOFOL;  Surgeon: [name], MD;  Location: [facility] ENDOSCOPY;  Service: Endoscopy;  Laterality: N/A;   ESOPHAGOGASTRODUODENOSCOPY (EGD) WITH PROPOFOL N/A 10/25/2018   Procedure: ESOPHAGOGASTRODUODENOSCOPY (EGD) WITH PROPOFOL;  Surgeon: [name], MD;  Location: [facility] ENDOSCOPY;  Service: Endoscopy;  Laterality: N/A;   HERNIA REPAIR     umbilical   HIATAL HERNIA REPAIR     I & D EXTREMITY Left 06/27/2015   Procedure: IRRIGATION AND DEBRIDEMENT EXTREMITY            ( CALF HEMATOMA ) POSSIBLE WOUND VAC;  Surgeon: [name], MD;  Location: [facility];  Service: Vascular;  Laterality: Left;   INCISION AND DRAINAGE OF WOUND Left 09/25/2016   Procedure: IRRIGATION AND DEBRIDEMENT - PARTIAL RESECTION OF ACHILLES TENDON WITH WOUND VAC APPLICATION;  Surgeon: [name], DPM;  Location: [facility];  Service: Podiatry;  Laterality: Left;   INCISION AND DRAINAGE OF WOUND Left 11/09/2018   Procedure: Excision of left foot wound with ACell placement;  Surgeon: Wallace Going, DO;  Location: [place];  Service: Plastics;  Laterality: Left;   IRRIGATION AND DEBRIDEMENT ABSCESS Left 09/07/2016   Procedure: IRRIGATION AND DEBRIDEMENT ABSCESS LEFT HEEL;  Surgeon: [name], DPM;  Location: [facility];  Service: Podiatry;  Laterality: Left;   JOINT REPLACEMENT  2013   left knee replacement   KIDNEY SURGERY Right    kidney stones   LITHOTRIPSY     LOWER EXTREMITY ANGIOGRAPHY Left 04/04/2018   Procedure: LOWER EXTREMITY ANGIOGRAPHY;  Surgeon: [name], MD;  Location: [place] [facility];  Service: Cardiovascular;  Laterality: Left;   NM GATED MYOVIEW [facility] HX)  February 2017   Likely breast attenuation. LOW RISK study. Normal EF 55-60%.   OTHER SURGICAL HISTORY     08/2016 or 09/2016 surgery on achilles tenso h/o staph infection heal. Dr. [name]   removal of left  hematoma Left    leg   REVERSE SHOULDER ARTHROPLASTY Right 10/08/2015   Procedure: REVERSE SHOULDER ARTHROPLASTY;  Surgeon: [name], MD;  Location: [facility];  Service: Orthopedics;  Laterality: Right;   SLEEVE GASTROPLASTY     TONSILLECTOMY     TOTAL KNEE ARTHROPLASTY Left    TOTAL SHOULDER REPLACEMENT Right    TRANSESOPHAGEAL ECHOCARDIOGRAM  08/08/2013   Mild LVH, EF 60-65%. Moderate LA dilation and mild RA dilation. Mild MR with no evidence of stenosis and no evidence of endocarditis. A false chordae is noted.   TRANSTHORACIC ECHOCARDIOGRAM  08/03/2013   Mild-Moderate concentric LVH, EF 60-65%. Normal diastolic function. Mild LA dilation. Mild MR with possible vegetation  - not confirmed on TEE    ULNAR NERVE TRANSPOSITION Right 08/06/2016   Procedure: ULNAR NERVE DECOMPRESSION/TRANSPOSITION;  Surgeon: [name], MD;  Location: [facility];  Service:  Orthopedics;  Laterality: Right;   UPPER GI ENDOSCOPY     VAGINAL HYSTERECTOMY       Home Medications:  Prior to Admission medications   Medication Sig Start Date End Date Taking? Authorizing Provider  albuterol (PROVENTIL HFA;VENTOLIN HFA) 108 (90 Base) MCG/ACT inhaler Inhale 1-2 puffs into the lungs every 4 (four) hours as needed for wheezing or shortness of breath. Patient taking differently: Inhale 2 puffs into the lungs every 4 (four) hours as needed for wheezing or shortness of breath.  03/29/15  Yes Bobetta Lime, MD  albuterol (PROVENTIL) (2.5 MG/3ML) 0.083% nebulizer solution Take 3 mLs (2.5 mg total) by nebulization every 6 (six) hours as needed for wheezing or shortness of breath. 02/21/15  Yes Ashok Norris, MD  ALPRAZolam Duanne Moron) 0.5 MG tablet Take 1 tablet (0.5 mg total) by mouth 2 (two) times daily as needed for anxiety. 07/04/19  Yes [name], [name], MD  buPROPion (WELLBUTRIN XL) 150 MG 24 hr tablet Take 1 tablet (150 mg total) by mouth daily. 05/02/19  Yes [name], [name], MD  colchicine 0.6 MG  tablet Take by mouth. 03/15/19  Yes [provider]  dexlansoprazole (DEXILANT) 60 MG capsule Take 60 mg by mouth daily before breakfast.   Yes [provider]  DULoxetine (CYMBALTA) 60 MG capsule Take 1 capsule (60 mg total) by mouth daily. 08/30/19 02/26/20 Yes Milinda Pointer, MD  estradiol (ESTRACE) 0.1 MG/GM vaginal cream Place 1 Applicatorful vaginally every Monday, Wednesday, and Friday. Apply 0.5mg  (pea-sized amount)  just inside the vaginal introitus with a finger-tip on Monday, Wednesday and Friday nights, 02/01/19  Yes McGowan, [name] A, PA-C  fluticasone-salmeterol (ADVAIR HFA) 115-21 MCG/ACT inhaler Inhale 2 puffs into the lungs daily. Rinse mouth thoroughly after use 08/16/19  Yes Tyler Pita, MD  HYDROcodone-acetaminophen (NORCO/VICODIN) 5-325 MG tablet Take 1 tablet by mouth every 8 (eight) hours as needed for severe pain. Must last 30 days 09/01/19 10/01/19 Yes Milinda Pointer, MD  levocetirizine (XYZAL) 5 MG tablet Take 1 tablet (5 mg total) by mouth every evening. 08/12/18  Yes [name], [name], MD  levothyroxine (SYNTHROID) 175 MCG tablet Take 1 tablet (175 mcg total) by mouth daily before breakfast. 03/10/19  Yes [name], [name], MD  losartan-hydrochlorothiazide (HYZAAR) 50-12.5 MG tablet Take 1 tablet by mouth daily. 02/02/19  Yes [provider]  meloxicam (MOBIC) 15 MG tablet Take 1 tablet (15 mg total) by mouth daily as needed for pain. 09/13/19  Yes [name], [name], MD  metFORMIN (GLUCOPHAGE) 1000 MG tablet Take 1 tablet (1,000 mg total) by mouth 2 (two) times daily. With food 12/14/18  Yes [name], [name], MD  metoprolol succinate (TOPROL-XL) 50 MG 24 hr tablet TAKE 1 TABLET BY MOUTH DAILY 07/05/19  Yes Wellington Hampshire, MD  montelukast (SINGULAIR) 10 MG tablet Take 1 tablet (10 mg total) by mouth at bedtime. 02/03/19  Yes [name], [name], MD  mupirocin ointment (BACTROBAN) 2 % Apply 1 application  topically 2 (two) times daily. Skin wounds 10/04/17  Yes [name], [name], MD  nystatin cream (MYCOSTATIN) Apply 1 application topically 2 (two) times daily. Mouth prn for 7 days as needed Patient taking differently: Apply 1 application topically 2 (two) times daily as needed for dry skin. Mouth prn for 7 days as needed 11/17/17  Yes [name], [name], MD  ondansetron (ZOFRAN) 4 MG tablet Take 1 tablet (4 mg total) by mouth every 8 (eight) hours as needed for nausea or vomiting. 02/14/19  Yes  [name], [name], MD  pantoprazole (PROTONIX) 40 MG tablet Take by mouth. 04/07/19 04/06/20 Yes [provider]  PRADAXA 150 MG CAPS capsule TAKE 1 CAPSULE TWICE DAILY 06/05/19  Yes Wellington Hampshire, MD  pregabalin (LYRICA) 50 MG capsule Take 1 capsule (50 mg total) by mouth 3 (three) times daily. 08/30/19 02/26/20 Yes Milinda Pointer, MD  sucralfate (CARAFATE) 1 g tablet Take 1 g by mouth 3 (three) times daily. 08/25/19  Yes [provider]  sucralfate (CARAFATE) 1 GM/10ML suspension Take 1 g by mouth 3 (three) times daily. 08/15/19  Yes [provider]  TOVIAZ 8 MG TB24 tablet TAKE ONE TABLET BY MOUTH EVERY DAY 08/07/19  Yes McGowan, [name] A, PA-C  trospium (SANCTURA) 20 MG tablet Take 1 tablet (20 mg total) by mouth 2 (two) times daily. 09/01/19 11/30/19 Yes Vaillancourt, Samantha, PA-C  zinc oxide 20 % ointment Apply 1 application topically as needed for irritation. 09/02/17  Yes McGowan, [name] A, PA-C  HYDROcodone-acetaminophen (NORCO/VICODIN) 5-325 MG tablet Take 1 tablet by mouth every 8 (eight) hours as needed for severe pain. Must last 30 days 10/01/19 10/31/19  Milinda Pointer, MD  HYDROcodone-acetaminophen (NORCO/VICODIN) 5-325 MG tablet Take 1 tablet by mouth every 8 (eight) hours as needed for severe pain. Must last 30 days 10/31/19 11/30/19  Milinda Pointer, MD    Inpatient Medications: Scheduled Meds:  buPROPion  150 mg Oral Daily   DULoxetine  60 mg  Oral Daily   [START ON 09/04/2019] estradiol  1 Applicatorful Vaginal Q M,W,F   fesoterodine  8 mg Oral Daily   losartan  50 mg Oral Daily   And   hydrochlorothiazide  12.5 mg Oral Daily   insulin aspart  0-20 Units Subcutaneous TID WC   ipratropium-albuterol  3 mL Nebulization BID   levothyroxine  175 mcg Oral QAC breakfast   loratadine  10 mg Oral Daily   metoprolol succinate  50 mg Oral Daily   mometasone-formoterol  2 puff Inhalation BID   montelukast  10 mg Oral QHS   nystatin   Topical BID   pantoprazole  40 mg Oral Daily   pregabalin  50 mg Oral TID   sodium chloride flush  3 mL Intravenous Q12H   [START ON 09/08/2019] Vitamin D (Ergocalciferol)  50,000 Units Oral Weekly   Continuous Infusions:  sodium chloride     azithromycin     cefTRIAXone (ROCEPHIN)  IV     heparin Stopped (09/03/19 0936)   PRN Meds: sodium chloride, acetaminophen **OR** acetaminophen, albuterol, ALPRAZolam, colchicine, HYDROcodone-acetaminophen, ondansetron **OR** ondansetron (ZOFRAN) IV, sodium chloride flush  Allergies:    Allergies  Allergen Reactions   Morphine And Related Other (See Comments) and Nausea Only    Patient becomes very confused Other reaction(s): Other (See Comments), Other (See Comments) Patient becomes very confused Patient becomes very confused    Penicillins Hives, Shortness Of Breath, Swelling and Other (See Comments)    Facial swelling Has patient had a PCN reaction causing immediate rash, facial/tongue/throat swelling, SOB or lightheadedness with hypotension: Yes Has patient had a PCN reaction causing severe rash involving mucus membranes or skin necrosis: Yes Has patient had a PCN reaction that required hospitalization No Has patient had a PCN reaction occurring within the last 10 years: No If all of the above answers are "NO", then may proceed with Cephalosporin use.  Other reaction(s): Other (See Comments), Other (See Comments) Facial  swelling Has patient had a PCN reaction causing immediate rash, facial/tongue/throat swelling, SOB  or lightheadedness with hypotension: Yes Has patient had a PCN reaction causing severe rash involving mucus membranes or skin necrosis: Yes Has patient had a PCN reaction that required hospitalization No Has patient had a PCN reaction occurring within the last 10 years: No If all of the above answers are "NO", then may proceed with Cephalosporin use. Facial swelling Has patient had a PCN reaction causing immediate rash, facial/tongue/throat swelling, SOB or lightheadedness with hypotension: Yes Has patient had a PCN reaction causing severe rash involving mucus membranes or skin necrosis: Yes Has patient had a PCN reaction that required hospitalization No Has patient had a PCN reaction occurring within the last 10 years: No If all of the above answers are "NO", then may proceed with Cephalosporin use. Facial swelling Has patient had a PCN reaction causing immediate rash, facial/tongue/throat swelling, SOB or lightheadedness with hypotension: Yes Has patient had a PCN reaction causing severe rash involving mucus membranes or skin necrosis: Yes Has patient had a PCN reaction that required hospitalization No Has patient had a PCN reaction occurring within the last 10 years: No If all of the above answers are "NO", then may proceed with Cephalosporin use.    Aspirin Hives   Oxytetracycline Hives    Other reaction(s): Unknown     Social History:   Social History   Socioeconomic History   Marital status: Married    Spouse name: Not on file   Number of children: Not on file   Years of education: Not on file   Highest education level: Not on file  Occupational History   Not on file  Tobacco Use   Smoking status: Never Smoker   Smokeless tobacco: Never Used  Vaping Use   Vaping Use: Never used  Substance and Sexual Activity   Alcohol use: No    Alcohol/week: 0.0 standard  drinks   Drug use: Never   Sexual activity: Yes  Other Topics Concern   Not on file  Social History Narrative   She is currently married -- for 30+ years. Does not work. Does not smoke or take alcohol. She never smoked. She exercises at least 3 days a week since before her gastric surgery.   Marital status reviewed in history of present illness.   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    Social Determinants of Radio broadcast assistant Strain:    Difficulty of Paying Living Expenses:   Food Insecurity:    Worried About Charity fundraiser in the Last Year:    Arboriculturist in the Last Year:   Transportation Needs:    Film/video editor (Medical):    Lack of Transportation (Non-Medical):   Physical Activity:    Days of Exercise per Week:    Minutes of Exercise per Session:   Stress:    Feeling of Stress :   Social Connections:    Frequency of Communication with Friends and Family:    Frequency of Social Gatherings with Friends and Family:    Attends Religious Services:    Active Member of Clubs or Organizations:    Attends Music therapist:    Marital Status:   Intimate Partner Violence:    Fear of Current or Ex-Partner:    Emotionally Abused:    Physically Abused:    Sexually Abused:     Family History:    Family History  Problem Relation Age  of Onset   Skin cancer Father    Diabetes Father    Hypertension Father    Peripheral vascular disease Father    Cancer Father    Cerebral aneurysm Father    Alcohol abuse Father    Varicose Veins Mother    Kidney disease Mother    Arthritis Mother    Mental illness Sister    Cancer Maternal Aunt        breast   Breast cancer Maternal Aunt    Arthritis Maternal Grandmother    Hypertension Maternal Grandmother    Diabetes Maternal Grandmother    Arthritis Maternal Grandfather    Heart disease Maternal  Grandfather    Hypertension Maternal Grandfather    Arthritis Paternal Grandmother    Hypertension Paternal Grandmother    Diabetes Paternal Grandmother    Arthritis Paternal Grandfather    Hypertension Paternal Grandfather    Bladder Cancer Neg Hx    Kidney cancer Neg Hx      ROS:  Please see the history of present illness.   All other ROS reviewed and negative.     Physical Exam/Data:   Vitals:   09/02/19 1958 09/02/19 2037 09/03/19 0440 09/03/19 0739  BP:  112/87 129/66 136/66  Pulse:  75 81 87  Resp:  20 20 17   Temp:  98.4 F (36.9 C) 97.9 F (36.6 C) 98.4 F (36.9 C)  TempSrc:  Oral Oral   SpO2: 100% 99% 96% 95%  Weight:   114.9 kg   Height:        Intake/Output Summary (Last 24 hours) at 09/03/2019 1023 Last data filed at 09/03/2019 0939 Gross per 24 hour  Intake 395.62 ml  Output 550 ml  Net -154.38 ml   Last 3 Weights 09/03/2019 09/02/2019 09/01/2019  Weight (lbs) 253 lb 4.8 oz 252 lb 252 lb  Weight (kg) 114.896 kg 114.306 kg 114.306 kg     Body mass index is 42.15 kg/m.  General:  Well nourished, well developed, in no acute distress HEENT: normal Lymph: no adenopathy Neck: no JVD Endocrine:  No thryomegaly Vascular: No carotid bruits; FA pulses 2+ bilaterally without bruits  Cardiac:  normal S1, S2; RRR; no murmur  Lungs:  clear to auscultation bilaterally, no wheezing, rhonchi or rales  Abd: soft, nontender, no hepatomegaly  Ext: no edema Musculoskeletal:  No deformities, BUE and BLE strength normal and equal Skin: warm and dry  Neuro:  CNs 2-12 intact, no focal abnormalities noted Psych:  Normal affect   EKG:  The EKG was personally reviewed and demonstrates: Sinus rhythm, no ST changes Telemetry:  Telemetry was personally reviewed and demonstrates: Sinus rhythm  Relevant CV Studies: Echocardiogram from 2015 showed normal systolic function. Myoview in 2020 was normal.  Laboratory Data:  High Sensitivity Troponin:   Recent Labs  Lab  09/02/19 1044 09/02/19 1430 09/02/19 1557  TROPONINIHS 120* 171* 143*     Chemistry Recent Labs  Lab 09/02/19 1044 09/03/19 0238  NA 136 135  K 4.0 3.8  CL 100 100  CO2 25 26  GLUCOSE 237* 275*  BUN 21 25*  CREATININE 1.26* 1.25*  CALCIUM 8.5* 8.3*  GFRNONAA 43* 44*  GFRAA 50* 51*  ANIONGAP 11 9    Recent Labs  Lab 09/02/19 1044  PROT 6.8  ALBUMIN 3.4*  AST 13*  ALT 9  ALKPHOS 74  BILITOT 0.7   Hematology Recent Labs  Lab 09/02/19 1044 09/03/19 0238  WBC 12.2* 7.8  RBC 3.87 3.66*  HGB 10.2* 9.7*  HCT 32.6* 31.2*  MCV 84.2 85.2  MCH 26.4 26.5  MCHC 31.3 31.1  RDW 15.9* 15.5  PLT 251 196   BNP Recent Labs  Lab 09/02/19 1044  BNP 384.0*    DDimer No results for input(s): DDIMER in the last 168 hours.   Radiology/Studies:  CT Angio Chest PE W and/or Wo Contrast  Result Date: 09/02/2019 CLINICAL DATA:  Shortness of breath EXAM: CT ANGIOGRAPHY CHEST WITH CONTRAST TECHNIQUE: Multidetector CT imaging of the chest was performed using the standard protocol during bolus administration of intravenous contrast. Multiplanar CT image reconstructions and MIPs were obtained to evaluate the vascular anatomy. CONTRAST:  36mL OMNIPAQUE IOHEXOL 350 MG/ML SOLN COMPARISON:  07/28/2018 FINDINGS: Cardiovascular: Satisfactory opacification of the pulmonary arteries to the segmental level. No evidence of pulmonary embolism. Cardiomegaly. No pericardial effusion. Mediastinum/Nodes: No enlarged mediastinal, hilar, or axillary lymph nodes. Thyroid gland, trachea, and esophagus demonstrate no significant findings. Lungs/Pleura: Bandlike scarring of the right lung base with elevation of the right hemidiaphragm. Heterogeneous airspace opacity of the right middle lobe (series 6, image 50). No pleural effusion or pneumothorax. Upper Abdomen: No acute abnormality. Partially imaged findings of partial gastrectomy in the included upper abdomen. Musculoskeletal: No chest wall abnormality. No  acute or significant osseous findings. Review of the MIP images confirms the above findings. IMPRESSION: 1. Negative examination for pulmonary embolism. 2. Heterogeneous airspace opacity of the right middle lobe concerning for infection. 3. Cardiomegaly. Electronically Signed   By: Eddie Candle M.D.   On: 09/02/2019 13:32   DG Chest Port 1 View  Result Date: 09/02/2019 CLINICAL DATA:  Altered mental status with weakness EXAM: PORTABLE CHEST 1 VIEW COMPARISON:  Chest radiograph and chest CT Jul 28, 2018 FINDINGS: There is stable elevation of the right hemidiaphragm. No edema or airspace opacity. Heart size and pulmonary vascularity normal. No adenopathy. There is a total shoulder replacement on the right. IMPRESSION: Elevation right hemidiaphragm, stable. No edema or airspace opacity. Stable cardiac silhouette. No adenopathy. Electronically Signed   By: Lowella Grip III M.D.   On: 09/02/2019 11:30          Assessment and Plan:   1. Elevated troponins -Troponins were minimally elevated at 120, 170, 143.  Patient asymptomatic.  This likely corresponds to demand ischemia and not ACS. -Stop heparin infusion.  Will review echocardiogram which was ordered. -Prior echocardiogram was normal, recent Myoview in 2020 with no evidence for ischemia.  2.  Paroxysmal atrial fibrillation -Currently in sinus rhythm -Continue beta-blocker, restart PTA Pradaxa  3.  History of hypertension -BP controlled.  Continue beta-blocker, losartan, HCTZ.  4.  Pneumonia -Antibiotics as per primary team  Will review echo as mentioned above.  If echocardiogram is normal, we will not anticipate any additional cardiac testing.  Thank you for this consult  Signed, Kate Sable, MD  09/03/2019 10:23 AM

## 2019-09-03 NOTE — Progress Notes (Signed)
PHARMACY - PHYSICIAN COMMUNICATION CRITICAL VALUE ALERT - BLOOD CULTURE IDENTIFICATION (BCID)  [name] is an 69 y.o. adult who presented to [facility] on 09/02/2019 with a chief complaint of SOB, weakness  Assessment:  Lab reports 2 of 4 bottles + for GPC, strep agalactiae  Name of physician (or Provider) Contacted: Dr Damita Dunnings  Current antibiotics: Zithromax and Rocephin  Changes to prescribed antibiotics recommended: Switch to Vanc due to PCN allergy.  D/W Dr Damita Dunnings.  WBC normalized, afebrile.  Defer ABX change to attending on day shift rounds.   Results for orders placed or performed during the hospital encounter of 09/02/19  Blood Culture ID Panel (Reflexed) (Collected: 09/02/2019 10:44 AM)  Result Value Ref Range   Enterococcus species NOT DETECTED NOT DETECTED   Listeria monocytogenes NOT DETECTED NOT DETECTED   Staphylococcus species NOT DETECTED NOT DETECTED   Staphylococcus aureus (BCID) NOT DETECTED NOT DETECTED   Streptococcus species DETECTED (A) NOT DETECTED   Streptococcus agalactiae DETECTED (A) NOT DETECTED   Streptococcus pneumoniae NOT DETECTED NOT DETECTED   Streptococcus pyogenes NOT DETECTED NOT DETECTED   Acinetobacter baumannii NOT DETECTED NOT DETECTED   Enterobacteriaceae species NOT DETECTED NOT DETECTED   Enterobacter cloacae complex NOT DETECTED NOT DETECTED   Escherichia coli NOT DETECTED NOT DETECTED   Klebsiella oxytoca NOT DETECTED NOT DETECTED   Klebsiella pneumoniae NOT DETECTED NOT DETECTED   Proteus species NOT DETECTED NOT DETECTED   Serratia marcescens NOT DETECTED NOT DETECTED   Haemophilus influenzae NOT DETECTED NOT DETECTED   Neisseria meningitidis NOT DETECTED NOT DETECTED   Pseudomonas aeruginosa NOT DETECTED NOT DETECTED   Candida albicans NOT DETECTED NOT DETECTED   Candida glabrata NOT DETECTED NOT DETECTED   Candida krusei NOT DETECTED NOT DETECTED   Candida parapsilosis NOT DETECTED NOT DETECTED   Candida tropicalis NOT  DETECTED NOT DETECTED    Hart Robinsons A 09/03/2019  5:42 AM

## 2019-09-03 NOTE — Progress Notes (Signed)
PT Cancellation Note  Patient Details Name: [name] MRN: [id number] DOB: 21-Dec-1950   Cancelled Treatment:    Reason Eval/Treat Not Completed: Medical issues which prohibited therapy.  Per nsg pt is on bedrest to manage her issues overnight, will hold visit at medical request until tomorrow.   Ramond Dial 09/03/2019, 12:59 PM   Mee Hives, PT MS Acute Rehab Dept. Number: [place]

## 2019-09-04 DIAGNOSIS — L89626 Pressure-induced deep tissue damage of left heel: Secondary | ICD-10-CM

## 2019-09-04 DIAGNOSIS — I480 Paroxysmal atrial fibrillation: Secondary | ICD-10-CM

## 2019-09-04 DIAGNOSIS — E039 Hypothyroidism, unspecified: Secondary | ICD-10-CM

## 2019-09-04 DIAGNOSIS — R5381 Other malaise: Secondary | ICD-10-CM

## 2019-09-04 DIAGNOSIS — L8990 Pressure ulcer of unspecified site, unspecified stage: Secondary | ICD-10-CM | POA: Diagnosis present

## 2019-09-04 DIAGNOSIS — I10 Essential (primary) hypertension: Secondary | ICD-10-CM

## 2019-09-04 DIAGNOSIS — R778 Other specified abnormalities of plasma proteins: Secondary | ICD-10-CM

## 2019-09-04 LAB — GLUCOSE, CAPILLARY
Glucose-Capillary: 130 mg/dL — ABNORMAL HIGH (ref 70–99)
Glucose-Capillary: 133 mg/dL — ABNORMAL HIGH (ref 70–99)
Glucose-Capillary: 115 mg/dL — ABNORMAL HIGH (ref 70–99)
Glucose-Capillary: 162 mg/dL — ABNORMAL HIGH (ref 70–99)

## 2019-09-04 LAB — SEDIMENTATION RATE: Sed Rate: 60 mm/hr — ABNORMAL HIGH (ref 0–30)

## 2019-09-04 LAB — BASIC METABOLIC PANEL
Anion gap: 8 (ref 5–15)
BUN: 30 mg/dL — ABNORMAL HIGH (ref 8–23)
CO2: 29 mmol/L (ref 22–32)
Calcium: 8.6 mg/dL — ABNORMAL LOW (ref 8.9–10.3)
Chloride: 104 mmol/L (ref 98–111)
Creatinine, Ser: 1.19 mg/dL — ABNORMAL HIGH (ref 0.44–1.00)
GFR calc Af Amer: 54 mL/min — ABNORMAL LOW (ref >60)
GFR calc non Af Amer: 47 mL/min — ABNORMAL LOW (ref >60)
Glucose, Bld: 159 mg/dL — ABNORMAL HIGH (ref 70–99)
Potassium: 4.2 mmol/L (ref 3.5–5.1)
Sodium: 141 mmol/L (ref 135–145)

## 2019-09-04 LAB — CBC WITH DIFFERENTIAL/PLATELET
Abs Immature Granulocytes: 0.04 K/uL (ref 0.00–0.07)
Basophils Absolute: 0 K/uL (ref 0.0–0.1)
Basophils Relative: 0 %
Eosinophils Absolute: 0 K/uL (ref 0.0–0.5)
Eosinophils Relative: 0 %
HCT: 28.1 % — ABNORMAL LOW (ref 36.0–46.0)
Hemoglobin: 9.1 g/dL — ABNORMAL LOW (ref 12.0–15.0)
Immature Granulocytes: 1 %
Lymphocytes Relative: 8 %
Lymphs Abs: 0.6 K/uL — ABNORMAL LOW (ref 0.7–4.0)
MCH: 26.7 pg (ref 26.0–34.0)
MCHC: 32.4 g/dL (ref 30.0–36.0)
MCV: 82.4 fL (ref 80.0–100.0)
Monocytes Absolute: 0.7 K/uL (ref 0.1–1.0)
Monocytes Relative: 8 %
Neutro Abs: 6.5 K/uL (ref 1.7–7.7)
Neutrophils Relative %: 83 %
Platelets: 219 K/uL (ref 150–400)
RBC: 3.41 MIL/uL — ABNORMAL LOW (ref 3.87–5.11)
RDW: 16 % — ABNORMAL HIGH (ref 11.5–15.5)
WBC: 7.8 K/uL (ref 4.0–10.5)
nRBC: 0 % (ref 0.0–0.2)

## 2019-09-04 LAB — URINE CULTURE

## 2019-09-04 LAB — C-REACTIVE PROTEIN: CRP: 8.8 mg/dL — ABNORMAL HIGH (ref <1.0)

## 2019-09-04 MED ORDER — ACETAMINOPHEN 325 MG PO TABS: 650.0000 mg | ORAL_TABLET | Freq: Four times a day (QID) | ORAL | Status: DC | PRN

## 2019-09-04 MED ORDER — ACETAMINOPHEN 650 MG RE SUPP: 650.0000 mg | Freq: Four times a day (QID) | RECTAL | Status: DC | PRN

## 2019-09-04 MED ORDER — AMLODIPINE BESYLATE 5 MG PO TABS
5.0000 mg | ORAL_TABLET | Freq: Every day | ORAL | Status: DC
  Administered 2019-09-04 – 2019-09-08 (×5): 5 mg via ORAL
  Filled 2019-09-04 (×5): qty 1

## 2019-09-04 NOTE — Progress Notes (Addendum)
Progress Note  Patient Name: [name] Date of Encounter: 09/04/2019  [facility] Cardiologist: Kathlyn Sacramento, MD   Subjective   Had an episode of shortness of breath overnight, placed on nasal cpap machine with improvement in symptoms.  She currently feels okay.  Inpatient Medications    Scheduled Meds: . buPROPion  150 mg Oral Daily  . dabigatran  150 mg Oral Q12H  . DULoxetine  60 mg Oral Daily  . estradiol  1 Applicatorful Vaginal Q M,W,F  . fesoterodine  8 mg Oral Daily  . losartan  50 mg Oral Daily   And  . hydrochlorothiazide  12.5 mg Oral Daily  . insulin aspart  0-20 Units Subcutaneous TID WC  . ipratropium-albuterol  3 mL Nebulization BID  . levothyroxine  175 mcg Oral QAC breakfast  . loratadine  10 mg Oral Daily  . metoprolol succinate  50 mg Oral Daily  . mometasone-formoterol  2 puff Inhalation BID  . montelukast  10 mg Oral QHS  . nystatin   Topical BID  . pantoprazole  40 mg Oral Daily  . pregabalin  50 mg Oral TID  . sodium chloride flush  3 mL Intravenous Q12H  . [START ON 09/08/2019] Vitamin D (Ergocalciferol)  50,000 Units Oral Weekly   Continuous Infusions: . sodium chloride    . azithromycin 500 mg (09/03/19 1818)  . cefTRIAXone (ROCEPHIN)  IV 2 g (09/03/19 1722)   PRN Meds: sodium chloride, acetaminophen **OR** acetaminophen, albuterol, ALPRAZolam, colchicine, HYDROcodone-acetaminophen, ondansetron **OR** ondansetron (ZOFRAN) IV, sodium chloride flush   Vital Signs    Vitals:   09/03/19 2017 09/04/19 0451 09/04/19 0600 09/04/19 0735  BP:  (!) 149/72  (!) 163/82  Pulse:  74  70  Resp:  20  18  Temp:  98.6 F (37 C)  97.8 F (36.6 C)  TempSrc:  Oral    SpO2: 96% 95%  97%  Weight:   115.3 kg   Height:        Intake/Output Summary (Last 24 hours) at 09/04/2019 0807 Last data filed at 09/04/2019 0604 Gross per 24 hour  Intake 596 ml  Output 3350 ml  Net -2754 ml   Last 3 Weights 09/04/2019 09/03/2019 09/02/2019  Weight (lbs)  254 lb 3.1 oz 253 lb 4.8 oz 252 lb  Weight (kg) 115.3 kg 114.896 kg 114.306 kg      Telemetry    Sinus rhythm- Personally Reviewed  ECG    No new tracing obtained- Personally Reviewed  Physical Exam   GEN: No acute distress.   Neck: No JVD Cardiac: RRR, no murmurs, rubs, or gallops.  Respiratory: Clear to auscultation bilaterally. GI: Soft, nontender, non-distended  MS: No edema; No deformity. Neuro:  Nonfocal  Psych: Normal affect   Labs    High Sensitivity Troponin:   Recent Labs  Lab 09/02/19 1044 09/02/19 1430 09/02/19 1557  TROPONINIHS 120* 171* 143*      Chemistry Recent Labs  Lab 09/02/19 1044 09/03/19 0238 09/04/19 0531  NA 136 135 141  K 4.0 3.8 4.2  CL 100 100 104  CO2 25 26 29   GLUCOSE 237* 275* 159*  BUN 21 25* 30*  CREATININE 1.26* 1.25* 1.19*  CALCIUM 8.5* 8.3* 8.6*  PROT 6.8  --   --   ALBUMIN 3.4*  --   --   AST 13*  --   --   ALT 9  --   --   ALKPHOS 74  --   --  BILITOT 0.7  --   --   GFRNONAA 43* 44* 47*  GFRAA 50* 51* 54*  ANIONGAP 11 9 8      Hematology Recent Labs  Lab 09/02/19 1044 09/03/19 0238 09/04/19 0531  WBC 12.2* 7.8 7.8  RBC 3.87 3.66* 3.41*  HGB 10.2* 9.7* 9.1*  HCT 32.6* 31.2* 28.1*  MCV 84.2 85.2 82.4  MCH 26.4 26.5 26.7  MCHC 31.3 31.1 32.4  RDW 15.9* 15.5 16.0*  PLT 251 196 219    BNP Recent Labs  Lab 09/02/19 1044  BNP 384.0*     DDimer No results for input(s): DDIMER in the last 168 hours.   Radiology    CT Angio Chest PE W and/or Wo Contrast  Result Date: 09/02/2019 CLINICAL DATA:  Shortness of breath EXAM: CT ANGIOGRAPHY CHEST WITH CONTRAST TECHNIQUE: Multidetector CT imaging of the chest was performed using the standard protocol during bolus administration of intravenous contrast. Multiplanar CT image reconstructions and MIPs were obtained to evaluate the vascular anatomy. CONTRAST:  52mL OMNIPAQUE IOHEXOL 350 MG/ML SOLN COMPARISON:  07/28/2018 FINDINGS: Cardiovascular: Satisfactory  opacification of the pulmonary arteries to the segmental level. No evidence of pulmonary embolism. Cardiomegaly. No pericardial effusion. Mediastinum/Nodes: No enlarged mediastinal, hilar, or axillary lymph nodes. Thyroid gland, trachea, and esophagus demonstrate no significant findings. Lungs/Pleura: Bandlike scarring of the right lung base with elevation of the right hemidiaphragm. Heterogeneous airspace opacity of the right middle lobe (series 6, image 50). No pleural effusion or pneumothorax. Upper Abdomen: No acute abnormality. Partially imaged findings of partial gastrectomy in the included upper abdomen. Musculoskeletal: No chest wall abnormality. No acute or significant osseous findings. Review of the MIP images confirms the above findings. IMPRESSION: 1. Negative examination for pulmonary embolism. 2. Heterogeneous airspace opacity of the right middle lobe concerning for infection. 3. Cardiomegaly. Electronically Signed   By: Eddie Candle M.D.   On: 09/02/2019 13:32   DG Chest Port 1 View  Result Date: 09/02/2019 CLINICAL DATA:  Altered mental status with weakness EXAM: PORTABLE CHEST 1 VIEW COMPARISON:  Chest radiograph and chest CT Jul 28, 2018 FINDINGS: There is stable elevation of the right hemidiaphragm. No edema or airspace opacity. Heart size and pulmonary vascularity normal. No adenopathy. There is a total shoulder replacement on the right. IMPRESSION: Elevation right hemidiaphragm, stable. No edema or airspace opacity. Stable cardiac silhouette. No adenopathy. Electronically Signed   By: Lowella Grip III M.D.   On: 09/02/2019 11:30   DG Foot 2 Views Left  Result Date: 09/03/2019 CLINICAL DATA:  Foot pain, unable to bear weight on left foot EXAM: LEFT FOOT - 2 VIEW; RIGHT FOOT - 2 VIEW COMPARISON:  05/29/2010 FINDINGS: No fracture or dislocation of the bilateral feet. There is a bunion deformity of the left great toe with crossover hammertoe deformity of the left second toe. Severe left  first metatarsophalangeal arthrosis. Moderate bilateral midfoot arthrosis. Prior right fifth metatarsal osteotomy. Soft tissues are unremarkable. IMPRESSION: 1. No fracture or dislocation of the bilateral feet. 2. Severe left first metatarsophalangeal arthrosis. 3. Moderate bilateral midfoot arthrosis. Electronically Signed   By: Eddie Candle M.D.   On: 09/03/2019 15:26   DG Foot 2 Views Right  Result Date: 09/03/2019 CLINICAL DATA:  Foot pain, unable to bear weight on left foot EXAM: LEFT FOOT - 2 VIEW; RIGHT FOOT - 2 VIEW COMPARISON:  05/29/2010 FINDINGS: No fracture or dislocation of the bilateral feet. There is a bunion deformity of the left great toe with crossover hammertoe deformity  of the left second toe. Severe left first metatarsophalangeal arthrosis. Moderate bilateral midfoot arthrosis. Prior right fifth metatarsal osteotomy. Soft tissues are unremarkable. IMPRESSION: 1. No fracture or dislocation of the bilateral feet. 2. Severe left first metatarsophalangeal arthrosis. 3. Moderate bilateral midfoot arthrosis. Electronically Signed   By: Eddie Candle M.D.   On: 09/03/2019 15:26   ECHOCARDIOGRAM COMPLETE  Result Date: 09/03/2019    ECHOCARDIOGRAM REPORT   Patient Name:   [name] Date of Exam: 09/03/2019 Medical Rec #:  [id number]       Height:       65.0 in Accession #:    [id number]      Weight:       253.3 lb Date of Birth:  [birth year]       BSA:          2.187 m Patient Age:    55 years        BP:           136/66 mmHg Patient Gender: F               HR:           89 bpm. Exam Location:  [facility] Procedure: 2D Echo Indications:     Chest Pain R07.9  History:         Patient has no prior history of Echocardiogram examinations.  Sonographer:     Arville Go RDCS Referring Phys:  [id number] EUMPNTIR AGBATA Diagnosing Phys: Kate Sable MD IMPRESSIONS  1. Left ventricular ejection fraction, by estimation, is 60 to 65%. The left ventricle has normal function. The left ventricle has no  regional wall motion abnormalities. Left ventricular diastolic parameters were normal.  2. Right ventricular systolic function is normal. The right ventricular size is normal.  3. The mitral valve is normal in structure. No evidence of mitral valve regurgitation. No evidence of mitral stenosis.  4. The aortic valve is normal in structure. Aortic valve regurgitation is not visualized. No aortic stenosis is present.  5. The inferior vena cava is normal in size with <50% respiratory variability, suggesting right atrial pressure of 8 mmHg. FINDINGS  Left Ventricle: Left ventricular ejection fraction, by estimation, is 60 to 65%. The left ventricle has normal function. The left ventricle has no regional wall motion abnormalities. The left ventricular internal cavity size was normal in size. There is  no left ventricular hypertrophy. Left ventricular diastolic parameters were normal. Right Ventricle: The right ventricular size is normal. No increase in right ventricular wall thickness. Right ventricular systolic function is normal. Left Atrium: Left atrial size was normal in size. Right Atrium: Right atrial size was normal in size. Pericardium: There is no evidence of pericardial effusion. Mitral Valve: The mitral valve is normal in structure. Normal mobility of the mitral valve leaflets. No evidence of mitral valve regurgitation. No evidence of mitral valve stenosis. MV peak gradient, 6.9 mmHg. The mean mitral valve gradient is 3.0 mmHg. Tricuspid Valve: The tricuspid valve is normal in structure. Tricuspid valve regurgitation is mild . No evidence of tricuspid stenosis. Aortic Valve: The aortic valve is normal in structure. Aortic valve regurgitation is not visualized. No aortic stenosis is present. Pulmonic Valve: The pulmonic valve was not well visualized. Pulmonic valve regurgitation is not visualized. No evidence of pulmonic stenosis. Aorta: The aortic root is normal in size and structure. Venous: The inferior vena  cava is normal in size with less than 50% respiratory variability, suggesting right atrial pressure of 8 mmHg.  IAS/Shunts: No atrial level shunt detected by color flow Doppler.  LEFT VENTRICLE PLAX 2D LVIDd:         4.79 cm  Diastology LVIDs:         3.15 cm  LV e' lateral:   10.30 cm/s LV PW:         1.16 cm  LV E/e' lateral: 10.3 LV IVS:        1.29 cm  LV e' medial:    8.49 cm/s LVOT diam:     1.90 cm  LV E/e' medial:  12.5 LV SV:         59 LV SV Index:   27 LVOT Area:     2.84 cm  RIGHT VENTRICLE RV Basal diam:  3.78 cm RV S prime:     13.90 cm/s LEFT ATRIUM             Index LA diam:        4.80 cm 2.19 cm/m LA Vol (A2C):   36.6 ml 16.73 ml/m LA Vol (A4C):   39.8 ml 18.20 ml/m LA Biplane Vol: 39.9 ml 18.24 ml/m  AORTIC VALVE             PULMONIC VALVE LVOT Vmax:   88.10 cm/s  PV Vmax:       1.28 m/s LVOT Vmean:  62.600 cm/s PV Peak grad:  6.6 mmHg LVOT VTI:    0.207 m  AORTA Ao Root diam: 3.20 cm Ao Asc diam:  3.00 cm MITRAL VALVE                TRICUSPID VALVE MV Area (PHT): 4.31 cm     TV Peak grad:   26.5 mmHg MV Peak grad:  6.9 mmHg     TV Vmax:        2.58 m/s MV Mean grad:  3.0 mmHg MV Vmax:       1.31 m/s     SHUNTS MV Vmean:      79.8 cm/s    Systemic VTI:  0.21 m MV Decel Time: 176 msec     Systemic Diam: 1.90 cm MV E velocity: 106.00 cm/s MV A velocity: 96.80 cm/s MV E/A ratio:  1.10 Kate Sable MD Electronically signed by Kate Sable MD Signature Date/Time: 09/03/2019/12:15:17 PM    Final     Cardiac Studies   Echocardiogram 09/03/2019 1. Left ventricular ejection fraction, by estimation, is 60 to 65%. The  left ventricle has normal function. The left ventricle has no regional  wall motion abnormalities. Left ventricular diastolic parameters were  normal.  2. Right ventricular systolic function is normal. The right ventricular  size is normal.  3. The mitral valve is normal in structure. No evidence of mitral valve  regurgitation. No evidence of mitral stenosis.  4.  The aortic valve is normal in structure. Aortic valve regurgitation is  not visualized. No aortic stenosis is present.  5. The inferior vena cava is normal in size with <50% respiratory  variability, suggesting right atrial pressure of 8 mmHg  Patient Profile     69 y.o. adult female with history of COPD, diabetes, hypertension, obesity, sleep apnea, admitted with shortness of breath, diagnosed with pneumonia being seen for elevated troponins.  Assessment & Plan    1.  Elevated troponins -Secondary to demand ischemia and not ACS -Echocardiogram with normal systolic and diastolic function -No further cardiac testing indicated at this time  2.  Paroxysmal atrial fibrillation -Currently in sinus rhythm -Continue  beta-blocker, restart PTA Pradaxa  3.  History of hypertension -  Continue beta-blocker, losartan, HCTZ.  4.  Pneumonia -Antibiotics as per primary team  5. OSA -Recommend nightly CPAP  No additional cardiac testing indicated at this time.  Recommend patient follow-up upon discharge in the office with Dr. Fletcher Anon.  Please let us know if further cardiac input is needed  Total encounter time 35 minutes  Greater than 50% was spent in counseling and coordination of care with the patient   [facility] will sign off.   Medication Recommendations: Continue current cardiac meds Other recommendations (labs, testing, etc): None Follow up as an outpatient: Yes with Dr. Fletcher Anon  For questions or updates, please contact [facility] Please consult [email or web address] for contact info under        Signed, Kate Sable, MD  09/04/2019, 8:07 AM

## 2019-09-04 NOTE — Progress Notes (Signed)
PROGRESS NOTE    [name]  GLO:[id number]  DOB: December 28, 1950  PCP: [name], [name], MD Admit date:09/02/2019 69 y/o female with h/oanxiety, depression, diabetes mellitus, COPD, hypertension, morbid obesity who presents to the ED for evaluation of productive cough, chills, generalized weakness and shortness of breathfor 2 days.Patient had mild dysuria, was seen by urology as an outpatient and was told her urine was sterile. On the morning of her admission she was very weak and unable to get up out of bed. Her husband called EMS due to worsening weakness Per EMS patient had a temp of 100.8 when they arrived and was noted to be hypoxic-low 80s on RA. Patient was placed on oxygen supplementation with improvement in her pulse oximetry. She received Tylenol 975 mg x 1 dose as well as albuterol treatment enroute. ED Course: WBC 12.2with a left shift as well as pyuria. Trop 120,Chest x-ray shows no acute findings. EKG-minimal ST depression in the inferior leads.COVID screen -ve. Hospital course: Patient admitted to [facility] with heparin drip and IV abx.   Subjective:  Patient appears to be awake alert and oriented x3.  She states she takes Xanax only when she gets anxious and usually feels anxious after talking to her daughter apparently due to social issues.  Objective: Vitals:   09/03/19 2017 09/04/19 0451 09/04/19 0600 09/04/19 0735  BP:  (!) 149/72  (!) 163/82  Pulse:  74  70  Resp:  20  18  Temp:  98.6 F (37 C)  97.8 F (36.6 C)  TempSrc:  Oral    SpO2: 96% 95%  97%  Weight:   115.3 kg   Height:        Intake/Output Summary (Last 24 hours) at 09/04/2019 0812 Last data filed at 09/04/2019 0604 Gross per 24 hour  Intake 596 ml  Output 3350 ml  Net -2754 ml   Filed Weights   09/02/19 1058 09/03/19 0440 09/04/19 0600  Weight: 114.3 kg 114.9 kg 115.3 kg    Physical Examination:  General exam: Appears calm and comfortable  Respiratory system: Clear to auscultation.  Respiratory effort normal. Cardiovascular system: S1 & S2 heard, RRR. No JVD, murmurs, rubs, gallops or clicks. No pedal edema. Gastrointestinal system: Abdomen is nondistended, soft and nontender. Normal bowel sounds heard. Central nervous system: Alert and oriented. No new focal neurological deficits. Extremities/skin: Left heel superficial ulceration with no signs of active drainage/infection, old injury/chronic hyperpigmentation along left shin  Psychiatry: Judgement and insight appear normal. Mood & affect appropriate.   Data Reviewed: I have personally reviewed following labs and imaging studies  CBC: Recent Labs  Lab 09/02/19 1044 09/03/19 0238 09/04/19 0531  WBC 12.2* 7.8 7.8  NEUTROABS 10.9*  --  6.5  HGB 10.2* 9.7* 9.1*  HCT 32.6* 31.2* 28.1*  MCV 84.2 85.2 82.4  PLT 251 196 188   Basic Metabolic Panel: Recent Labs  Lab 09/02/19 1044 09/03/19 0238 09/04/19 0531  NA 136 135 141  K 4.0 3.8 4.2  CL 100 100 104  CO2 25 26 29   GLUCOSE 237* 275* 159*  BUN 21 25* 30*  CREATININE 1.26* 1.25* 1.19*  CALCIUM 8.5* 8.3* 8.6*   GFR: Estimated Creatinine Clearance (by C-G formula based on SCr of 1.19 mg/dL (H)) Female: 56.6 mL/min (A) Female: 68.8 mL/min (A) Liver Function Tests: Recent Labs  Lab 09/02/19 1044  AST 13*  ALT 9  ALKPHOS 74  BILITOT 0.7  PROT 6.8  ALBUMIN 3.4*   No results for input(s):  LIPASE, AMYLASE in the last 168 hours. No results for input(s): AMMONIA in the last 168 hours. Coagulation Profile: Recent Labs  Lab 09/02/19 1235  INR 1.5*   Cardiac Enzymes: No results for input(s): CKTOTAL, CKMB, CKMBINDEX, TROPONINI in the last 168 hours. BNP (last 3 results) No results for input(s): PROBNP in the last 8760 hours. HbA1C: Recent Labs    09/02/19 1557  HGBA1C 6.8*   CBG: Recent Labs  Lab 09/03/19 0740 09/03/19 1134 09/03/19 1555 09/03/19 2126 09/04/19 0737  GLUCAP 254* 274* 108* 189* 130*   Lipid Profile: No results for  input(s): CHOL, HDL, LDLCALC, TRIG, CHOLHDL, LDLDIRECT in the last 72 hours. Thyroid Function Tests: No results for input(s): TSH, T4TOTAL, FREET4, T3FREE, THYROIDAB in the last 72 hours. Anemia Panel: No results for input(s): VITAMINB12, FOLATE, FERRITIN, TIBC, IRON, RETICCTPCT in the last 72 hours. Sepsis Labs: Recent Labs  Lab 09/02/19 1044  LATICACIDVEN 1.2    Recent Results (from the past 240 hour(s))  Microscopic Examination     Status: Abnormal   Collection Time: 09/01/19  2:20 PM   Urine  Result Value Ref Range Status   WBC, UA >30 (H) 0 - 5 /hpf Final   RBC None seen 0 - 2 /hpf Final   Epithelial Cells (non renal) 0-10 0 - 10 /hpf Final   Bacteria, UA Many (A) None seen/Few Final  Culture, blood (Routine x 2)     Status: None (Preliminary result)   Collection Time: 09/02/19 10:44 AM   Specimen: BLOOD  Result Value Ref Range Status   Specimen Description BLOOD R UPPER ARM  Final   Special Requests   Final    BOTTLES DRAWN AEROBIC AND ANAEROBIC Blood Culture adequate volume   Culture  Setup Time   Final    Organism ID to follow IN BOTH AEROBIC AND ANAEROBIC BOTTLES GRAM POSITIVE COCCI CRITICAL RESULT CALLED TO, READ BACK BY AND VERIFIED WITH: SCOTT HALL ON 09/03/19 AT [facility] Performed at [facility], [address] [place], [place] [address]    Culture GRAM POSITIVE COCCI  Final   Report Status PENDING  Incomplete  Culture, blood (Routine x 2)     Status: None (Preliminary result)   Collection Time: 09/02/19 10:44 AM   Specimen: BLOOD  Result Value Ref Range Status   Specimen Description BLOOD RFA  Final   Special Requests   Final    BOTTLES DRAWN AEROBIC AND ANAEROBIC Blood Culture adequate volume   Culture  Setup Time   Final    GRAM POSITIVE COCCI AEROBIC BOTTLE ONLY CRITICAL VALUE NOTED.  VALUE IS CONSISTENT WITH PREVIOUSLY REPORTED AND CALLED VALUE. Performed at [facility], [address] [place],  [address]    Culture  [facility] POSITIVE COCCI  Final   Report Status PENDING  Incomplete  Blood Culture ID Panel (Reflexed)     Status: Abnormal   Collection Time: 09/02/19 10:44 AM  Result Value Ref Range Status   Enterococcus species NOT DETECTED NOT DETECTED Final   Listeria monocytogenes NOT DETECTED NOT DETECTED Final   Staphylococcus species NOT DETECTED NOT DETECTED Final   Staphylococcus aureus (BCID) NOT DETECTED NOT DETECTED Final   Streptococcus species DETECTED (A) NOT DETECTED Final    Comment: CRITICAL RESULT CALLED TO, READ BACK BY AND VERIFIED WITH: SCOTT HALL ON 09/03/19 AT 0437 [place]    Streptococcus agalactiae DETECTED (A) NOT DETECTED Final    Comment: CRITICAL RESULT CALLED TO, READ BACK BY AND VERIFIED  WITH: SCOTT HALL ON 09/03/19 AT 0437 [place]    Streptococcus pneumoniae NOT DETECTED NOT DETECTED Final   Streptococcus pyogenes NOT DETECTED NOT DETECTED Final   Acinetobacter baumannii NOT DETECTED NOT DETECTED Final   Enterobacteriaceae species NOT DETECTED NOT DETECTED Final   Enterobacter cloacae complex NOT DETECTED NOT DETECTED Final   Escherichia coli NOT DETECTED NOT DETECTED Final   Klebsiella oxytoca NOT DETECTED NOT DETECTED Final   Klebsiella pneumoniae NOT DETECTED NOT DETECTED Final   Proteus species NOT DETECTED NOT DETECTED Final   Serratia marcescens NOT DETECTED NOT DETECTED Final   Haemophilus influenzae NOT DETECTED NOT DETECTED Final   Neisseria meningitidis NOT DETECTED NOT DETECTED Final   Pseudomonas aeruginosa NOT DETECTED NOT DETECTED Final   Candida albicans NOT DETECTED NOT DETECTED Final   Candida glabrata NOT DETECTED NOT DETECTED Final   Candida krusei NOT DETECTED NOT DETECTED Final   Candida parapsilosis NOT DETECTED NOT DETECTED Final   Candida tropicalis NOT DETECTED NOT DETECTED Final    Comment: Performed at [facility], [address] [place], [place] [address]  SARS Coronavirus 2 by RT PCR (hospital order, performed in [place] [facility]  lab) Nasopharyngeal Nasopharyngeal Swab     Status: None   Collection Time: 09/02/19  3:28 PM   Specimen: Nasopharyngeal Swab  Result Value Ref Range Status   SARS Coronavirus 2 NEGATIVE NEGATIVE Final    Comment: (NOTE) SARS-CoV-2 target nucleic acids are NOT DETECTED.  The SARS-CoV-2 RNA is generally detectable in upper and lower respiratory specimens during the acute phase of infection. The lowest concentration of SARS-CoV-2 viral copies this assay can detect is 250 copies / mL. A negative result does not preclude SARS-CoV-2 infection and should not be used as the sole basis for treatment or other patient management decisions.  A negative result may occur with improper specimen collection / handling, submission of specimen other than nasopharyngeal swab, presence of viral mutation(s) within the areas targeted by this assay, and inadequate number of viral copies (<250 copies / mL). A negative result must be combined with clinical observations, patient history, and epidemiological information.  Fact Sheet for Patients:   StrictlyIdeas.no  Fact Sheet for Healthcare Providers: BankingDealers.co.za  This test is not yet approved or  cleared by the [place] FDA and has been authorized for detection and/or diagnosis of SARS-CoV-2 by FDA under an Emergency Use Authorization (EUA).  This EUA will remain in effect (meaning this test can be used) for the duration of the COVID-19 declaration under Section 564(b)(1) of the Act, 21 U.S.C. section 360bbb-3(b)(1), unless the authorization is terminated or revoked sooner.  Performed at [facility], [address] [place],  [address]       Radiology Studies: CT Angio Chest PE W and/or Wo Contrast  Result Date: 09/02/2019 CLINICAL DATA:  Shortness of breath EXAM: CT ANGIOGRAPHY CHEST WITH CONTRAST TECHNIQUE: Multidetector CT imaging of the chest was performed using the  standard protocol during bolus administration of intravenous contrast. Multiplanar CT image reconstructions and MIPs were obtained to evaluate the vascular anatomy. CONTRAST:  46mL OMNIPAQUE IOHEXOL 350 MG/ML SOLN COMPARISON:  07/28/2018 FINDINGS: Cardiovascular: Satisfactory opacification of the pulmonary arteries to the segmental level. No evidence of pulmonary embolism. Cardiomegaly. No pericardial effusion. Mediastinum/Nodes: No enlarged mediastinal, hilar, or axillary lymph nodes. Thyroid gland, trachea, and esophagus demonstrate no significant findings. Lungs/Pleura: Bandlike scarring of the right lung base with elevation of the right hemidiaphragm. Heterogeneous airspace opacity of the right middle  lobe (series 6, image 50). No pleural effusion or pneumothorax. Upper Abdomen: No acute abnormality. Partially imaged findings of partial gastrectomy in the included upper abdomen. Musculoskeletal: No chest wall abnormality. No acute or significant osseous findings. Review of the MIP images confirms the above findings. IMPRESSION: 1. Negative examination for pulmonary embolism. 2. Heterogeneous airspace opacity of the right middle lobe concerning for infection. 3. Cardiomegaly. Electronically Signed   By: Eddie Candle M.D.   On: 09/02/2019 13:32   DG Chest Port 1 View  Result Date: 09/02/2019 CLINICAL DATA:  Altered mental status with weakness EXAM: PORTABLE CHEST 1 VIEW COMPARISON:  Chest radiograph and chest CT Jul 28, 2018 FINDINGS: There is stable elevation of the right hemidiaphragm. No edema or airspace opacity. Heart size and pulmonary vascularity normal. No adenopathy. There is a total shoulder replacement on the right. IMPRESSION: Elevation right hemidiaphragm, stable. No edema or airspace opacity. Stable cardiac silhouette. No adenopathy. Electronically Signed   By: Lowella Grip III M.D.   On: 09/02/2019 11:30   DG Foot 2 Views Left  Result Date: 09/03/2019 CLINICAL DATA:  Foot pain, unable to  bear weight on left foot EXAM: LEFT FOOT - 2 VIEW; RIGHT FOOT - 2 VIEW COMPARISON:  05/29/2010 FINDINGS: No fracture or dislocation of the bilateral feet. There is a bunion deformity of the left great toe with crossover hammertoe deformity of the left second toe. Severe left first metatarsophalangeal arthrosis. Moderate bilateral midfoot arthrosis. Prior right fifth metatarsal osteotomy. Soft tissues are unremarkable. IMPRESSION: 1. No fracture or dislocation of the bilateral feet. 2. Severe left first metatarsophalangeal arthrosis. 3. Moderate bilateral midfoot arthrosis. Electronically Signed   By: Eddie Candle M.D.   On: 09/03/2019 15:26   DG Foot 2 Views Right  Result Date: 09/03/2019 CLINICAL DATA:  Foot pain, unable to bear weight on left foot EXAM: LEFT FOOT - 2 VIEW; RIGHT FOOT - 2 VIEW COMPARISON:  05/29/2010 FINDINGS: No fracture or dislocation of the bilateral feet. There is a bunion deformity of the left great toe with crossover hammertoe deformity of the left second toe. Severe left first metatarsophalangeal arthrosis. Moderate bilateral midfoot arthrosis. Prior right fifth metatarsal osteotomy. Soft tissues are unremarkable. IMPRESSION: 1. No fracture or dislocation of the bilateral feet. 2. Severe left first metatarsophalangeal arthrosis. 3. Moderate bilateral midfoot arthrosis. Electronically Signed   By: Eddie Candle M.D.   On: 09/03/2019 15:26   ECHOCARDIOGRAM COMPLETE  Result Date: 09/03/2019    ECHOCARDIOGRAM REPORT   Patient Name:   [name] Date of Exam: 09/03/2019 Medical Rec #:  [id number]       Height:       65.0 in Accession #:    [id number]      Weight:       253.3 lb Date of Birth:  1950/10/07       BSA:          2.187 m Patient Age:    47 years        BP:           136/66 mmHg Patient Gender: F               HR:           89 bpm. Exam Location:  [facility] Procedure: 2D Echo Indications:     Chest Pain R07.9  History:         Patient has no prior history of Echocardiogram  examinations.  Sonographer:     Irish Elders  Stills RDCS Referring Phys:  [id number] Diagnosing Phys: Kate Sable MD IMPRESSIONS  1. Left ventricular ejection fraction, by estimation, is 60 to 65%. The left ventricle has normal function. The left ventricle has no regional wall motion abnormalities. Left ventricular diastolic parameters were normal.  2. Right ventricular systolic function is normal. The right ventricular size is normal.  3. The mitral valve is normal in structure. No evidence of mitral valve regurgitation. No evidence of mitral stenosis.  4. The aortic valve is normal in structure. Aortic valve regurgitation is not visualized. No aortic stenosis is present.  5. The inferior vena cava is normal in size with <50% respiratory variability, suggesting right atrial pressure of 8 mmHg. FINDINGS  Left Ventricle: Left ventricular ejection fraction, by estimation, is 60 to 65%. The left ventricle has normal function. The left ventricle has no regional wall motion abnormalities. The left ventricular internal cavity size was normal in size. There is  no left ventricular hypertrophy. Left ventricular diastolic parameters were normal. Right Ventricle: The right ventricular size is normal. No increase in right ventricular wall thickness. Right ventricular systolic function is normal. Left Atrium: Left atrial size was normal in size. Right Atrium: Right atrial size was normal in size. Pericardium: There is no evidence of pericardial effusion. Mitral Valve: The mitral valve is normal in structure. Normal mobility of the mitral valve leaflets. No evidence of mitral valve regurgitation. No evidence of mitral valve stenosis. MV peak gradient, 6.9 mmHg. The mean mitral valve gradient is 3.0 mmHg. Tricuspid Valve: The tricuspid valve is normal in structure. Tricuspid valve regurgitation is mild . No evidence of tricuspid stenosis. Aortic Valve: The aortic valve is normal in structure. Aortic valve  regurgitation is not visualized. No aortic stenosis is present. Pulmonic Valve: The pulmonic valve was not well visualized. Pulmonic valve regurgitation is not visualized. No evidence of pulmonic stenosis. Aorta: The aortic root is normal in size and structure. Venous: The inferior vena cava is normal in size with less than 50% respiratory variability, suggesting right atrial pressure of 8 mmHg. IAS/Shunts: No atrial level shunt detected by color flow Doppler.  LEFT VENTRICLE PLAX 2D LVIDd:         4.79 cm  Diastology LVIDs:         3.15 cm  LV e' lateral:   10.30 cm/s LV PW:         1.16 cm  LV E/e' lateral: 10.3 LV IVS:        1.29 cm  LV e' medial:    8.49 cm/s LVOT diam:     1.90 cm  LV E/e' medial:  12.5 LV SV:         59 LV SV Index:   27 LVOT Area:     2.84 cm  RIGHT VENTRICLE RV Basal diam:  3.78 cm RV S prime:     13.90 cm/s LEFT ATRIUM             Index LA diam:        4.80 cm 2.19 cm/m LA Vol (A2C):   36.6 ml 16.73 ml/m LA Vol (A4C):   39.8 ml 18.20 ml/m LA Biplane Vol: 39.9 ml 18.24 ml/m  AORTIC VALVE             PULMONIC VALVE LVOT Vmax:   88.10 cm/s  PV Vmax:       1.28 m/s LVOT Vmean:  62.600 cm/s PV Peak grad:  6.6 mmHg LVOT VTI:    0.207  m  AORTA Ao Root diam: 3.20 cm Ao Asc diam:  3.00 cm MITRAL VALVE                TRICUSPID VALVE MV Area (PHT): 4.31 cm     TV Peak grad:   26.5 mmHg MV Peak grad:  6.9 mmHg     TV Vmax:        2.58 m/s MV Mean grad:  3.0 mmHg MV Vmax:       1.31 m/s     SHUNTS MV Vmean:      79.8 cm/s    Systemic VTI:  0.21 m MV Decel Time: 176 msec     Systemic Diam: 1.90 cm MV E velocity: 106.00 cm/s MV A velocity: 96.80 cm/s MV E/A ratio:  1.10 Kate Sable MD Electronically signed by Kate Sable MD Signature Date/Time: 09/03/2019/12:15:17 PM    Final         Scheduled Meds: . buPROPion  150 mg Oral Daily  . dabigatran  150 mg Oral Q12H  . DULoxetine  60 mg Oral Daily  . estradiol  1 Applicatorful Vaginal Q M,W,F  . fesoterodine  8 mg Oral Daily  .  losartan  50 mg Oral Daily   And  . hydrochlorothiazide  12.5 mg Oral Daily  . insulin aspart  0-20 Units Subcutaneous TID WC  . ipratropium-albuterol  3 mL Nebulization BID  . levothyroxine  175 mcg Oral QAC breakfast  . loratadine  10 mg Oral Daily  . metoprolol succinate  50 mg Oral Daily  . mometasone-formoterol  2 puff Inhalation BID  . montelukast  10 mg Oral QHS  . nystatin   Topical BID  . pantoprazole  40 mg Oral Daily  . pregabalin  50 mg Oral TID  . sodium chloride flush  3 mL Intravenous Q12H  . [START ON 09/08/2019] Vitamin D (Ergocalciferol)  50,000 Units Oral Weekly   Continuous Infusions: . sodium chloride    . azithromycin 500 mg (09/03/19 1818)  . cefTRIAXone (ROCEPHIN)  IV 2 g (09/03/19 1722)     Assessment/Plan:  RML pneumonia with Sepsis and acute hypoxic respiratory failure: present on admission,withT-max of 100.27F,  leukocytosis12.2K,lobar pneumonia (right middle lobe),acute hypoxic respiratory failure and bacteremia.Patient was placed on oxygen supplementation with improvement in her pulse oximetry, now on room air.Blood culture from 6/12 grew 3 out of 4 bottles gram-positive cocci-streptococci agalactiae-sensitivities pending,2D echocardiogram did not show any vegetations.Continue Rocephin.  DC Zithromax, repeat blood culture sent today.  Unclear source of bacteremia-?  Skin versus lung.  Will discuss with ID regarding need for TEE.  Troponemia: could be secondary to demand ischemia from sepsis.Cardiology input appreciated. Echo 6/13-nl EF-D/Ced heparin drip, likely demand ischemia as myoview -ve in 2020.  ?Toxic encephalopathy:Husband apparently expressed concerns about patient having intermittent hallucination for a year or so. She had a CT of the head in May 2020 and that was normal,husband is wondering Xanax can cause intermittent hallucination,he is concerned that patient is on Xanax with unsteady feet,plan to taper Xanaxand follow up with  pcp-reduced to 0.25 mg twice daily as needed.  Patient however states she gets anxious whenever she talks to her daughter and worries about her.  She appears to be awake alert oriented x3 today during rounds.  Noninsulin-dependent type 2 diabetes,with hyperglycemia,A1c 6.8. Home medication Metformin on hold due to sepsis,on SSI here  Pyuria: urine culture with multiple species, repeat sample suggested.  On antibiotics for pneumonia  CKD 3 a,anemia of  chronic disease:Creatinine appears at baseline, in fact improved.  Renal dosing meds  Chronic A. Fib,rate controlled, continue Toprol-XL and Pradaxa  Hypertension: On Toprol-XL,losartan HCTZ  Hypothyroidism:Continue Synthroid  Chronic left heelwound, right heal pain/erythema-she is followed closely byDr Baker->consulted-> recommended x rays which show severe left first metatarsophalangeal arthrosis and moderate bilateral midfoot arthrosis.  Seen by podiatry today and recommended local dressings for what appears to be a recurrent heel wound due to calcaneal gait-recommended local dressing and offloading boots.  To follow-up podiatric clinic upon discharge.  Class 3 obesity:Body mass index is 42.15 kg/m.    DVT prophylaxis: Pradaxa Code Status:  Full code Family / Patient Communication:  Discussed with patient at bedside.  Family not present in the room. Disposition Plan:   Status is: Inpatient  Remains inpatient appropriate because:IV treatments appropriate due to intensity of illness or inability to take PO, on IV antibiotics and repeat blood cultures pending.   Dispo: The patient is from: Home  Anticipated d/c is to: May need short-term SNF rehab, awaiting formal PT evaluation.  Anticipated d/c date is: 2 days if repeat blood cultures negative  Patient currently is not medically stable to d/c.    LOS: 2 days    Time spent:     Guilford Shi, MD [facility] Pager in [place]  If 7PM-7AM, please contact night-coverage [email or web address] 09/04/2019, 8:12 AM

## 2019-09-04 NOTE — Plan of Care (Signed)

## 2019-09-04 NOTE — Progress Notes (Addendum)
PODIATRY / FOOT AND ANKLE SURGERY CONSULTATION NOTE  Requesting Physician: Dr. Royetta Crochet  Reason for consult: Left heel ulceration  Chief Complaint: Left heel ulcer   HPI: [name] is a 69 y.o. adult who presents resting in bed today comfortably.  Podiatry team was consulted to evaluate the left plantar heel ulceration which has been recurrent for the patient.  Patient is treated on an outpatient basis with Dr. [name].  She had a large wound to the posterior aspect of the heel around the Achilles tendon area and now has a residual wound at the plantar heel due to her calcaneal gait.  Patient has been using offloading felt padding, mupirocin cream, and a Band-Aid to cover the area daily since its reopen.  She ambulates in a slip on shoe with heel pad to try to protect the area further.  Patient was admitted due to pneumonia and bacteremia and is currently being worked up for that among other medical issues.  PMHx:  Past Medical History:  Diagnosis Date  . Abnormal antibody titer   . Anxiety and depression   . Arthritis   . Asthma   . Cystocele   . Depression   . Diabetes ([place])   . DVT (deep venous thrombosis) (HCC)    Righ calf  . Dyspnea    11/08/2018 - "more now due to lack of exercise"  . Dysrhythmia    afib  . Edema   . GERD (gastroesophageal reflux disease)   . Heart murmur    Echocardiogram June 2015: Mild MR (possible vegetation seen on TTE, not seen on TEE), normal LV size with moderate concentric LVH. Normal function EF 60-65%. Normal diastolic function. Mild LA dilation.  Marland Kitchen History of IBS   . History of kidney stones   . History of methicillin resistant staphylococcus aureus (MRSA) 2008  . Hyperlipidemia   . Hypertension   . Hypothyroidism   . Insomnia   . Kidney stone    kidney stones with lithotripsy .  [facility]- "adrenal glands"  . Neuropathy involving both lower extremities   . Obesity, Class II, BMI 35-39.9, with comorbidity   . Paroxysmal atrial  fibrillation ([place]) 2007   In June 2015, Cardiac Event Monitor: Mostly SR/sinus arrhythmia with PVCs that are frequent. Short bursts of A. fib lasting several minutes;; CHA2DS2-VASc Score = 5 (age, Female, PVD, DM, HTN)  . Peripheral vascular disease ([place])   . Sleep apnea    use C-PAP  . Urinary incontinence   . Venous stasis dermatitis of both lower extremities     Surgical Hx:  Past Surgical History:  Procedure Laterality Date  . ANKLE SURGERY Right   . APPLICATION OF WOUND VAC Left 06/27/2015   Procedure: APPLICATION OF WOUND VAC ( POSSIBLE ) ;  Surgeon: [name], MD;  Location: [facility] ORS;  Service: Vascular;  Laterality: Left;  . APPLICATION OF WOUND VAC Left 09/25/2016   Procedure: APPLICATION OF WOUND VAC;  Surgeon: [name], DPM;  Location: [facility];  Service: Podiatry;  Laterality: Left;  . arm surgery     right    . CHOLECYSTECTOMY    . COLONOSCOPY    . COLONOSCOPY WITH PROPOFOL N/A 01/11/2017   Procedure: COLONOSCOPY WITH PROPOFOL;  Surgeon: [name], MD;  Location: [facility] ENDOSCOPY;  Service: Endoscopy;  Laterality: N/A;  . COLONOSCOPY WITH PROPOFOL N/A 02/22/2017   Procedure: COLONOSCOPY WITH PROPOFOL;  Surgeon: [name], MD;  Location: [facility] ENDOSCOPY;  Service: Endoscopy;  Laterality: N/A;  . COLONOSCOPY WITH PROPOFOL N/A 05/31/2017   Procedure: COLONOSCOPY WITH PROPOFOL;  Surgeon: [name], MD;  Location: [facility] ENDOSCOPY;  Service: Endoscopy;  Laterality: N/A;  . ESOPHAGOGASTRODUODENOSCOPY (EGD) WITH PROPOFOL N/A 01/11/2017   Procedure: ESOPHAGOGASTRODUODENOSCOPY (EGD) WITH PROPOFOL;  Surgeon: [name], MD;  Location: [facility];  Service: Endoscopy;  Laterality: N/A;  . ESOPHAGOGASTRODUODENOSCOPY (EGD) WITH PROPOFOL N/A 10/25/2018   Procedure: ESOPHAGOGASTRODUODENOSCOPY (EGD) WITH PROPOFOL;  Surgeon: [name], MD;  Location: [facility] ENDOSCOPY;  Service: Endoscopy;  Laterality: N/A;  . HERNIA REPAIR     umbilical  .  HIATAL HERNIA REPAIR    . I & D EXTREMITY Left 06/27/2015   Procedure: IRRIGATION AND DEBRIDEMENT EXTREMITY            ( CALF HEMATOMA ) POSSIBLE WOUND VAC;  Surgeon: [name], MD;  Location: [facility];  Service: Vascular;  Laterality: Left;  . INCISION AND DRAINAGE OF WOUND Left 09/25/2016   Procedure: IRRIGATION AND DEBRIDEMENT - PARTIAL RESECTION OF ACHILLES TENDON WITH WOUND VAC APPLICATION;  Surgeon: [name], DPM;  Location: [facility];  Service: Podiatry;  Laterality: Left;  . INCISION AND DRAINAGE OF WOUND Left 11/09/2018   Procedure: Excision of left foot wound with ACell placement;  Surgeon: Wallace Going, DO;  Location: [place];  Service: Plastics;  Laterality: Left;  . IRRIGATION AND DEBRIDEMENT ABSCESS Left 09/07/2016   Procedure: IRRIGATION AND DEBRIDEMENT ABSCESS LEFT HEEL;  Surgeon: [name], DPM;  Location: [facility];  Service: Podiatry;  Laterality: Left;  . JOINT REPLACEMENT  2013   left knee replacement  . KIDNEY SURGERY Right    kidney stones  . LITHOTRIPSY    . LOWER EXTREMITY ANGIOGRAPHY Left 04/04/2018   Procedure: LOWER EXTREMITY ANGIOGRAPHY;  Surgeon: [name], MD;  Location: [place] [facility];  Service: Cardiovascular;  Laterality: Left;  . NM GATED MYOVIEW ([place] [facility])  February 2017   Likely breast attenuation. LOW RISK study. Normal EF 55-60%.  . OTHER SURGICAL HISTORY     08/2016 or 09/2016 surgery on achilles tenso h/o staph infection heal. Dr. [name]  . removal of left hematoma Left    leg  . REVERSE SHOULDER ARTHROPLASTY Right 10/08/2015   Procedure: REVERSE SHOULDER ARTHROPLASTY;  Surgeon: [name], MD;  Location: [facility];  Service: Orthopedics;  Laterality: Right;  . SLEEVE GASTROPLASTY    . TONSILLECTOMY    . TOTAL KNEE ARTHROPLASTY Left   . TOTAL SHOULDER REPLACEMENT Right   . TRANSESOPHAGEAL ECHOCARDIOGRAM  08/08/2013   Mild LVH, EF 60-65%. Moderate LA dilation and mild RA dilation. Mild MR with no evidence of stenosis and no  evidence of endocarditis. A false chordae is noted.  . TRANSTHORACIC ECHOCARDIOGRAM  08/03/2013   Mild-Moderate concentric LVH, EF 60-65%. Normal diastolic function. Mild LA dilation. Mild MR with possible vegetation  - not confirmed on TEE   . ULNAR NERVE TRANSPOSITION Right 08/06/2016   Procedure: ULNAR NERVE DECOMPRESSION/TRANSPOSITION;  Surgeon: [name], MD;  Location: [facility];  Service: Orthopedics;  Laterality: Right;  . UPPER GI ENDOSCOPY    . VAGINAL HYSTERECTOMY      FHx:  Family History  Problem Relation Age of Onset  . Skin cancer Father   . Diabetes Father   . Hypertension Father   . Peripheral vascular disease Father   . Cancer Father   . Cerebral aneurysm Father   . Alcohol abuse Father   . Varicose Veins Mother   .  Kidney disease Mother   . Arthritis Mother   . Mental illness Sister   . Cancer Maternal Aunt        breast  . Breast cancer Maternal Aunt   . Arthritis Maternal Grandmother   . Hypertension Maternal Grandmother   . Diabetes Maternal Grandmother   . Arthritis Maternal Grandfather   . Heart disease Maternal Grandfather   . Hypertension Maternal Grandfather   . Arthritis Paternal Grandmother   . Hypertension Paternal Grandmother   . Diabetes Paternal Grandmother   . Arthritis Paternal Grandfather   . Hypertension Paternal Grandfather   . Bladder Cancer Neg Hx   . Kidney cancer Neg Hx     Social History:  reports that she has never smoked. She has never used smokeless tobacco. She reports that she does not drink alcohol and does not use drugs.  Allergies:  Allergies  Allergen Reactions  . Morphine And Related Other (See Comments) and Nausea Only    Patient becomes very confused Other reaction(s): Other (See Comments), Other (See Comments) Patient becomes very confused Patient becomes very confused   . Penicillins Hives, Shortness Of Breath, Swelling and Other (See Comments)    Facial swelling Has patient had a PCN reaction causing  immediate rash, facial/tongue/throat swelling, SOB or lightheadedness with hypotension: Yes Has patient had a PCN reaction causing severe rash involving mucus membranes or skin necrosis: Yes Has patient had a PCN reaction that required hospitalization No Has patient had a PCN reaction occurring within the last 10 years: No If all of the above answers are "NO", then may proceed with Cephalosporin use.  Other reaction(s): Other (See Comments), Other (See Comments) Facial swelling Has patient had a PCN reaction causing immediate rash, facial/tongue/throat swelling, SOB or lightheadedness with hypotension: Yes Has patient had a PCN reaction causing severe rash involving mucus membranes or skin necrosis: Yes Has patient had a PCN reaction that required hospitalization No Has patient had a PCN reaction occurring within the last 10 years: No If all of the above answers are "NO", then may proceed with Cephalosporin use. Facial swelling Has patient had a PCN reaction causing immediate rash, facial/tongue/throat swelling, SOB or lightheadedness with hypotension: Yes Has patient had a PCN reaction causing severe rash involving mucus membranes or skin necrosis: Yes Has patient had a PCN reaction that required hospitalization No Has patient had a PCN reaction occurring within the last 10 years: No If all of the above answers are "NO", then may proceed with Cephalosporin use. Facial swelling Has patient had a PCN reaction causing immediate rash, facial/tongue/throat swelling, SOB or lightheadedness with hypotension: Yes Has patient had a PCN reaction causing severe rash involving mucus membranes or skin necrosis: Yes Has patient had a PCN reaction that required hospitalization No Has patient had a PCN reaction occurring within the last 10 years: No If all of the above answers are "NO", then may proceed with Cephalosporin use.   . Ambien [Zolpidem]     Hallucination   . Aspirin Hives  . Oxytetracycline  Hives    Other reaction(s): Unknown     Review of Systems: General ROS: negative Respiratory ROS: positive for - shortness of breath Cardiovascular ROS: no chest pain or dyspnea on exertion Gastrointestinal ROS: no abdominal pain, change in bowel habits, or black or bloody stools Musculoskeletal ROS: positive for - joint swelling Neurological ROS: positive for - numbness/tingling Dermatological ROS: positive for Left heel ulcer  Facility-Administered Medications Prior to Admission  Medication Dose Route Frequency  Provider Last Rate Last Admin  . ipratropium-albuterol (DUONEB) 0.5-2.5 (3) MG/3ML nebulizer solution 3 mL  3 mL Nebulization Q6H [name], [name], MD       Medications Prior to Admission  Medication Sig Dispense Refill  . albuterol (PROVENTIL HFA;VENTOLIN HFA) 108 (90 Base) MCG/ACT inhaler Inhale 1-2 puffs into the lungs every 4 (four) hours as needed for wheezing or shortness of breath. (Patient taking differently: Inhale 2 puffs into the lungs every 4 (four) hours as needed for wheezing or shortness of breath. ) 8 g 2  . albuterol (PROVENTIL) (2.5 MG/3ML) 0.083% nebulizer solution Take 3 mLs (2.5 mg total) by nebulization every 6 (six) hours as needed for wheezing or shortness of breath. 150 mL 1  . ALPRAZolam (XANAX) 0.5 MG tablet Take 1 tablet (0.5 mg total) by mouth 2 (two) times daily as needed for anxiety. 60 tablet 2  . buPROPion (WELLBUTRIN XL) 150 MG 24 hr tablet Take 1 tablet (150 mg total) by mouth daily. 90 tablet 3  . colchicine 0.6 MG tablet Take by mouth.    . dexlansoprazole (DEXILANT) 60 MG capsule Take 60 mg by mouth daily before breakfast.    . DULoxetine (CYMBALTA) 60 MG capsule Take 1 capsule (60 mg total) by mouth daily. 30 capsule 5  . estradiol (ESTRACE) 0.1 MG/GM vaginal cream Place 1 Applicatorful vaginally every Monday, Wednesday, and Friday. Apply 0.82m (pea-sized amount)  just inside the vaginal introitus with a finger-tip on Monday,  Wednesday and Friday nights, 42.5 g 11  . fluticasone-salmeterol (ADVAIR HFA) 115-21 MCG/ACT inhaler Inhale 2 puffs into the lungs daily. Rinse mouth thoroughly after use 1 Inhaler 12  . HYDROcodone-acetaminophen (NORCO/VICODIN) 5-325 MG tablet Take 1 tablet by mouth every 8 (eight) hours as needed for severe pain. Must last 30 days 90 tablet 0  . levocetirizine (XYZAL) 5 MG tablet Take 1 tablet (5 mg total) by mouth every evening. 90 tablet 3  . levothyroxine (SYNTHROID) 175 MCG tablet Take 1 tablet (175 mcg total) by mouth daily before breakfast. 90 tablet 3  . losartan-hydrochlorothiazide (HYZAAR) 50-12.5 MG tablet Take 1 tablet by mouth daily.    .Derrill MemoON 09/13/2019] meloxicam (MOBIC) 15 MG tablet Take 1 tablet (15 mg total) by mouth daily as needed for pain. 90 tablet 0  . metFORMIN (GLUCOPHAGE) 1000 MG tablet Take 1 tablet (1,000 mg total) by mouth 2 (two) times daily. With food 180 tablet 3  . metoprolol succinate (TOPROL-XL) 50 MG 24 hr tablet TAKE 1 TABLET BY MOUTH DAILY 90 tablet 0  . montelukast (SINGULAIR) 10 MG tablet Take 1 tablet (10 mg total) by mouth at bedtime. 90 tablet 3  . mupirocin ointment (BACTROBAN) 2 % Apply 1 application topically 2 (two) times daily. Skin wounds 30 g 0  . nystatin cream (MYCOSTATIN) Apply 1 application topically 2 (two) times daily. Mouth prn for 7 days as needed (Patient taking differently: Apply 1 application topically 2 (two) times daily as needed for dry skin. Mouth prn for 7 days as needed) 30 g 11  . ondansetron (ZOFRAN) 4 MG tablet Take 1 tablet (4 mg total) by mouth every 8 (eight) hours as needed for nausea or vomiting. 90 tablet 1  . pantoprazole (PROTONIX) 40 MG tablet Take by mouth.    .Marland KitchenPRADAXA 150 MG CAPS capsule TAKE 1 CAPSULE TWICE DAILY 180 capsule 0  . pregabalin (LYRICA) 50 MG capsule Take 1 capsule (50 mg total) by mouth 3 (three) times daily. 90 capsule 5  .  sucralfate (CARAFATE) 1 g tablet Take 1 g by mouth 3 (three) times daily.      . sucralfate (CARAFATE) 1 GM/10ML suspension Take 1 g by mouth 3 (three) times daily.    . TOVIAZ 8 MG TB24 tablet TAKE ONE TABLET BY MOUTH EVERY DAY 90 tablet 3  . trospium (SANCTURA) 20 MG tablet Take 1 tablet (20 mg total) by mouth 2 (two) times daily. 60 tablet 2  . zinc oxide 20 % ointment Apply 1 application topically as needed for irritation. 56.7 g 0  . [START ON 10/01/2019] HYDROcodone-acetaminophen (NORCO/VICODIN) 5-325 MG tablet Take 1 tablet by mouth every 8 (eight) hours as needed for severe pain. Must last 30 days 90 tablet 0  . [START ON 10/31/2019] HYDROcodone-acetaminophen (NORCO/VICODIN) 5-325 MG tablet Take 1 tablet by mouth every 8 (eight) hours as needed for severe pain. Must last 30 days 90 tablet 0    Physical Exam: General: Alert and oriented.  No apparent distress.  Vascular: DP/PT pulses faintly palpable bilateral, capillary fill time appears to be intact to digits bilateral.  No hair growth to digits.  Mild bilateral lower extremity nonpitting edema.  Neuro: Light touch sensation reduced to bilateral lower extremities. Derm: Small subcutaneous ulceration that measures approximately 0.3 x 0.3 x 0.2 cm to the plantar aspect of the left heel that upon debridement revealed 100% granular base, no drainage, no odor, no erythema, no edema, no signs of infection.     MSK: Calcaneal gait left.  Cavus type foot both.  Results for orders placed or performed during the hospital encounter of 09/02/19 (from the past 48 hour(s))  Comprehensive metabolic panel     Status: Abnormal   Collection Time: 09/02/19 10:44 AM  Result Value Ref Range   Sodium 136 135 - 145 mmol/L   Potassium 4.0 3.5 - 5.1 mmol/L   Chloride 100 98 - 111 mmol/L   CO2 25 22 - 32 mmol/L   Glucose, Bld 237 (H) 70 - 99 mg/dL    Comment: Glucose reference range applies only to samples taken after fasting for at least 8 hours.   BUN 21 8 - 23 mg/dL   Creatinine, Ser 1.26 (H) 0.44 - 1.00 mg/dL   Calcium 8.5  (L) 8.9 - 10.3 mg/dL   Total Protein 6.8 6.5 - 8.1 g/dL   Albumin 3.4 (L) 3.5 - 5.0 g/dL   AST 13 (L) 15 - 41 U/L   ALT 9 0 - 44 U/L   Alkaline Phosphatase 74 38 - 126 U/L   Total Bilirubin 0.7 0.3 - 1.2 mg/dL   GFR calc non Af Amer 43 (L) >60 mL/min   GFR calc Af Amer 50 (L) >60 mL/min   Anion gap 11 5 - 15    Comment: Performed at [facility], [address] [place], [place] [address]  Lactic acid, plasma     Status: None   Collection Time: 09/02/19 10:44 AM  Result Value Ref Range   Lactic Acid, Venous 1.2 0.5 - 1.9 mmol/L    Comment: Performed at [facility], [address] [place], [place] [address]  CBC with Differential     Status: Abnormal   Collection Time: 09/02/19 10:44 AM  Result Value Ref Range   WBC 12.2 (H) 4.0 - 10.5 K/uL   RBC 3.87 3.87 - 5.11 MIL/uL   Hemoglobin 10.2 (L) 12.0 - 15.0 g/dL   HCT 32.6 (L) 36 - 46 %   MCV 84.2 80.0 - 100.0  fL   MCH 26.4 26.0 - 34.0 pg   MCHC 31.3 30.0 - 36.0 g/dL   RDW 15.9 (H) 11.5 - 15.5 %   Platelets 251 150 - 400 K/uL   nRBC 0.0 0.0 - 0.2 %   Neutrophils Relative % 89 %   Neutro Abs 10.9 (H) 1.7 - 7.7 K/uL   Lymphocytes Relative 4 %   Lymphs Abs 0.4 (L) 0.7 - 4.0 K/uL   Monocytes Relative 6 %   Monocytes Absolute 0.8 0 - 1 K/uL   Eosinophils Relative 0 %   Eosinophils Absolute 0.0 0 - 0 K/uL   Basophils Relative 0 %   Basophils Absolute 0.1 0 - 0 K/uL   Immature Granulocytes 1 %   Abs Immature Granulocytes 0.06 0.00 - 0.07 K/uL    Comment: Performed at [facility], [address] [place], [place] [address]  Culture, blood (Routine x 2)     Status: None (Preliminary result)   Collection Time: 09/02/19 10:44 AM   Specimen: BLOOD  Result Value Ref Range   Specimen Description BLOOD R UPPER ARM    Special Requests      BOTTLES DRAWN AEROBIC AND ANAEROBIC Blood Culture adequate volume   Culture  Setup Time      Organism ID to follow IN BOTH AEROBIC AND ANAEROBIC BOTTLES GRAM POSITIVE  COCCI CRITICAL RESULT CALLED TO, READ BACK BY AND VERIFIED WITH: Lucien ON 09/03/19 AT [facility] Performed at [facility], [address] [place], [place] [address]    Culture GRAM POSITIVE COCCI    Report Status PENDING   Culture, blood (Routine x 2)     Status: None (Preliminary result)   Collection Time: 09/02/19 10:44 AM   Specimen: BLOOD  Result Value Ref Range   Specimen Description BLOOD RFA    Special Requests      BOTTLES DRAWN AEROBIC AND ANAEROBIC Blood Culture adequate volume   Culture  Setup Time      GRAM POSITIVE COCCI AEROBIC BOTTLE ONLY CRITICAL VALUE NOTED.  VALUE IS CONSISTENT WITH PREVIOUSLY REPORTED AND CALLED VALUE. Performed at [facility], [address] [place], [place] [address]    Culture GRAM POSITIVE COCCI    Report Status PENDING   Troponin I (High Sensitivity)     Status: Abnormal   Collection Time: 09/02/19 10:44 AM  Result Value Ref Range   Troponin I (High Sensitivity) 120 (HH) <18 ng/L    Comment: CRITICAL RESULT CALLED TO, READ BACK BY AND VERIFIED WITH ARIEL SMITH ON 09/02/19 AT 1205 BY JAG (NOTE) Elevated high sensitivity troponin I (hsTnI) values and significant  changes across serial measurements may suggest ACS but many other  chronic and acute conditions are known to elevate hsTnI results.  Refer to the "Links" section for chest pain algorithms and additional  guidance. Performed at [facility], [address] [place], [place] [address]   Brain natriuretic peptide     Status: Abnormal   Collection Time: 09/02/19 10:44 AM  Result Value Ref Range   B Natriuretic Peptide 384.0 (H) 0.0 - 100.0 pg/mL    Comment: Performed at [facility], [address] [place],  [address]  Blood Culture ID Panel (Reflexed)     Status: Abnormal   Collection Time: 09/02/19 10:44 AM  Result Value Ref Range   Enterococcus species NOT DETECTED NOT DETECTED   Listeria monocytogenes NOT DETECTED NOT  DETECTED   Staphylococcus species NOT DETECTED NOT DETECTED  Staphylococcus aureus (BCID) NOT DETECTED NOT DETECTED   Streptococcus species DETECTED (A) NOT DETECTED    Comment: CRITICAL RESULT CALLED TO, READ BACK BY AND VERIFIED WITH: SCOTT HALL ON 09/03/19 AT 0437 [facility]    Streptococcus agalactiae DETECTED (A) NOT DETECTED    Comment: CRITICAL RESULT CALLED TO, READ BACK BY AND VERIFIED WITH: SCOTT HALL ON 09/03/19 AT 6503 [place]    Streptococcus pneumoniae NOT DETECTED NOT DETECTED   Streptococcus pyogenes NOT DETECTED NOT DETECTED   Acinetobacter baumannii NOT DETECTED NOT DETECTED   Enterobacteriaceae species NOT DETECTED NOT DETECTED   Enterobacter cloacae complex NOT DETECTED NOT DETECTED   Escherichia coli NOT DETECTED NOT DETECTED   Klebsiella oxytoca NOT DETECTED NOT DETECTED   Klebsiella pneumoniae NOT DETECTED NOT DETECTED   Proteus species NOT DETECTED NOT DETECTED   Serratia marcescens NOT DETECTED NOT DETECTED   Haemophilus influenzae NOT DETECTED NOT DETECTED   Neisseria meningitidis NOT DETECTED NOT DETECTED   Pseudomonas aeruginosa NOT DETECTED NOT DETECTED   Candida albicans NOT DETECTED NOT DETECTED   Candida glabrata NOT DETECTED NOT DETECTED   Candida krusei NOT DETECTED NOT DETECTED   Candida parapsilosis NOT DETECTED NOT DETECTED   Candida tropicalis NOT DETECTED NOT DETECTED    Comment: Performed at [facility], [address] [place], [place] [address]  APTT     Status: Abnormal   Collection Time: 09/02/19 12:35 PM  Result Value Ref Range   aPTT 50 (H) 24 - 36 seconds    Comment:        IF BASELINE aPTT IS ELEVATED, SUGGEST PATIENT RISK ASSESSMENT BE USED TO DETERMINE APPROPRIATE ANTICOAGULANT THERAPY. Performed at [facility], [address] [place], [place] [address]   Protime-INR     Status: Abnormal   Collection Time: 09/02/19 12:35 PM  Result Value Ref Range   Prothrombin Time 17.4 (H) 11.4 - 15.2 seconds   INR 1.5 (H) 0.8  - 1.2    Comment: (NOTE) INR goal varies based on device and disease states. Performed at [facility], [place], [place] [address]   Troponin I (High Sensitivity)     Status: Abnormal   Collection Time: 09/02/19  2:30 PM  Result Value Ref Range   Troponin I (High Sensitivity) 171 (HH) <18 ng/L    Comment: CRITICAL VALUE NOTED. VALUE IS CONSISTENT WITH PREVIOUSLY REPORTED/CALLED VALUE / JAG (NOTE) Elevated high sensitivity troponin I (hsTnI) values and significant  changes across serial measurements may suggest ACS but many other  chronic and acute conditions are known to elevate hsTnI results.  Refer to the "Links" section for chest pain algorithms and additional  guidance. Performed at [facility], [address] [place], [place] [address]   SARS Coronavirus 2 by RT PCR (hospital order, performed in [facility] hospital lab) Nasopharyngeal Nasopharyngeal Swab     Status: None   Collection Time: 09/02/19  3:28 PM   Specimen: Nasopharyngeal Swab  Result Value Ref Range   SARS Coronavirus 2 NEGATIVE NEGATIVE    Comment: (NOTE) SARS-CoV-2 target nucleic acids are NOT DETECTED.  The SARS-CoV-2 RNA is generally detectable in upper and lower respiratory specimens during the acute phase of infection. The lowest concentration of SARS-CoV-2 viral copies this assay can detect is 250 copies / mL. A negative result does not preclude SARS-CoV-2 infection and should not be used as the sole basis for treatment or other patient management decisions.  A negative result may occur with improper  specimen collection / handling, submission of specimen other than nasopharyngeal swab, presence of viral mutation(s) within the areas targeted by this assay, and inadequate number of viral copies (<250 copies / mL). A negative result must be combined with clinical observations, patient history, and epidemiological information.  Fact Sheet for Patients:     StrictlyIdeas.no  Fact Sheet for Healthcare Providers: BankingDealers.co.za  This test is not yet approved or  cleared by the [place] FDA and has been authorized for detection and/or diagnosis of SARS-CoV-2 by FDA under an Emergency Use Authorization (EUA).  This EUA will remain in effect (meaning this test can be used) for the duration of the COVID-19 declaration under Section 564(b)(1) of the Act, 21 U.S.C. section 360bbb-3(b)(1), unless the authorization is terminated or revoked sooner.  Performed at [facility], [address] [place], [place] [place] [id number]   HIV Antibody (routine testing w rflx)     Status: None   Collection Time: 09/02/19  3:57 PM  Result Value Ref Range   HIV Screen 4th Generation wRfx Non Reactive Non Reactive    Comment: Performed at [facility], [place] [address] [place], [place] [address]  Hemoglobin A1c     Status: Abnormal   Collection Time: 09/02/19  3:57 PM  Result Value Ref Range   Hgb A1c MFr Bld 6.8 (H) 4.8 - 5.6 %    Comment: (NOTE) Pre diabetes:          5.7%-6.4%  Diabetes:              >6.4%  Glycemic control for   <7.0% adults with diabetes    Mean Plasma Glucose 148.46 mg/dL    Comment: Performed at [facility] [address] [place], [place] [address]  Troponin I (High Sensitivity)     Status: Abnormal   Collection Time: 09/02/19  3:57 PM  Result Value Ref Range   Troponin I (High Sensitivity) 143 (HH) <18 ng/L    Comment: CRITICAL VALUE NOTED. VALUE IS CONSISTENT WITH PREVIOUSLY REPORTED/CALLED VALUE.MSS/JAG (NOTE) Elevated high sensitivity troponin I (hsTnI) values and significant  changes across serial measurements may suggest ACS but many other  chronic and acute conditions are known to elevate hsTnI results.  Refer to the Links section for chest pain algorithms and additional  guidance. Performed at [facility], [address]  [place], [place] [address] CORRECTED ON 06/12 AT 8891: PREVIOUSLY REPORTED AS 143 CRITICAL VALUE NOTED. VALUE IS CONSISTENT WITH PREVIOUSLY REPORTED/CALLED VALUE.MSS/JAF   Glucose, capillary     Status: Abnormal   Collection Time: 09/02/19  4:02 PM  Result Value Ref Range   Glucose-Capillary 228 (H) 70 - 99 mg/dL    Comment: Glucose reference range applies only to samples taken after fasting for at least 8 hours.   Comment 1 Notify RN    Comment 2 Document in Chart   Heparin level (unfractionated)     Status: Abnormal   Collection Time: 09/02/19  6:49 PM  Result Value Ref Range   Heparin Unfractionated 0.22 (L) 0.30 - 0.70 IU/mL    Comment: (NOTE) If heparin results are below expected values, and patient dosage has  been confirmed, suggest follow up testing of antithrombin III levels. Performed at [facility], [address] [place], [place] [address]   Glucose, capillary     Status: Abnormal   Collection Time: 09/02/19  9:27 PM  Result Value Ref Range   Glucose-Capillary 282 (H) 70 - 99 mg/dL  Comment: Glucose reference range applies only to samples taken after fasting for at least 8 hours.  Basic metabolic panel     Status: Abnormal   Collection Time: 09/03/19  2:38 AM  Result Value Ref Range   Sodium 135 135 - 145 mmol/L   Potassium 3.8 3.5 - 5.1 mmol/L   Chloride 100 98 - 111 mmol/L   CO2 26 22 - 32 mmol/L   Glucose, Bld 275 (H) 70 - 99 mg/dL    Comment: Glucose reference range applies only to samples taken after fasting for at least 8 hours.   BUN 25 (H) 8 - 23 mg/dL   Creatinine, Ser 1.25 (H) 0.44 - 1.00 mg/dL   Calcium 8.3 (L) 8.9 - 10.3 mg/dL   GFR calc non Af Amer 44 (L) >60 mL/min   GFR calc Af Amer 51 (L) >60 mL/min   Anion gap 9 5 - 15    Comment: Performed at [facility], [address] [place], [place] [address]  CBC     Status: Abnormal   Collection Time: 09/03/19  2:38 AM  Result Value Ref Range   WBC 7.8 4.0 - 10.5 K/uL   RBC 3.66 (L)  3.87 - 5.11 MIL/uL   Hemoglobin 9.7 (L) 12.0 - 15.0 g/dL   HCT 31.2 (L) 36 - 46 %   MCV 85.2 80.0 - 100.0 fL   MCH 26.5 26.0 - 34.0 pg   MCHC 31.1 30.0 - 36.0 g/dL   RDW 15.5 11.5 - 15.5 %   Platelets 196 150 - 400 K/uL   nRBC 0.0 0.0 - 0.2 %    Comment: Performed at [facility], [place], [place] [address]  Heparin level (unfractionated)     Status: None   Collection Time: 09/03/19  3:30 AM  Result Value Ref Range   Heparin Unfractionated 0.38 0.30 - 0.70 IU/mL    Comment: (NOTE) If heparin results are below expected values, and patient dosage has  been confirmed, suggest follow up testing of antithrombin III levels. Performed at [facility], [address] [place], [place] [address]   Glucose, capillary     Status: Abnormal   Collection Time: 09/03/19  7:40 AM  Result Value Ref Range   Glucose-Capillary 254 (H) 70 - 99 mg/dL    Comment: Glucose reference range applies only to samples taken after fasting for at least 8 hours.  Heparin level (unfractionated)     Status: None   Collection Time: 09/03/19  9:19 AM  Result Value Ref Range   Heparin Unfractionated 0.34 0.30 - 0.70 IU/mL    Comment: (NOTE) If heparin results are below expected values, and patient dosage has  been confirmed, suggest follow up testing of antithrombin III levels. Performed at [facility], [address] [place], [place] [address]   Glucose, capillary     Status: Abnormal   Collection Time: 09/03/19 11:34 AM  Result Value Ref Range   Glucose-Capillary 274 (H) 70 - 99 mg/dL    Comment: Glucose reference range applies only to samples taken after fasting for at least 8 hours.  Urinalysis, Complete w Microscopic     Status: Abnormal   Collection Time: 09/03/19  1:07 PM  Result Value Ref Range   Color, Urine YELLOW (A) YELLOW   APPearance HAZY (A) CLEAR   Specific Gravity, Urine 1.019 1.005 - 1.030   pH 5.0 5.0 - 8.0   Glucose, UA 50 (A) NEGATIVE mg/dL  Hgb urine dipstick NEGATIVE NEGATIVE   Bilirubin Urine NEGATIVE NEGATIVE   Ketones, ur NEGATIVE NEGATIVE mg/dL   Protein, ur NEGATIVE NEGATIVE mg/dL   Nitrite NEGATIVE NEGATIVE   Leukocytes,Ua NEGATIVE NEGATIVE   RBC / HPF 0-5 0 - 5 RBC/hpf   WBC, UA 0-5 0 - 5 WBC/hpf   Bacteria, UA RARE (A) NONE SEEN   Squamous Epithelial / LPF 0-5 0 - 5   Hyaline Casts, UA PRESENT     Comment: Performed at [facility], [address] [place], [place] [address]  Glucose, capillary     Status: Abnormal   Collection Time: 09/03/19  3:55 PM  Result Value Ref Range   Glucose-Capillary 108 (H) 70 - 99 mg/dL    Comment: Glucose reference range applies only to samples taken after fasting for at least 8 hours.  Glucose, capillary     Status: Abnormal   Collection Time: 09/03/19  9:26 PM  Result Value Ref Range   Glucose-Capillary 189 (H) 70 - 99 mg/dL    Comment: Glucose reference range applies only to samples taken after fasting for at least 8 hours.  CBC with Differential/Platelet     Status: Abnormal   Collection Time: 09/04/19  5:31 AM  Result Value Ref Range   WBC 7.8 4.0 - 10.5 K/uL   RBC 3.41 (L) 3.87 - 5.11 MIL/uL   Hemoglobin 9.1 (L) 12.0 - 15.0 g/dL   HCT 28.1 (L) 36 - 46 %   MCV 82.4 80.0 - 100.0 fL   MCH 26.7 26.0 - 34.0 pg   MCHC 32.4 30.0 - 36.0 g/dL   RDW 16.0 (H) 11.5 - 15.5 %   Platelets 219 150 - 400 K/uL   nRBC 0.0 0.0 - 0.2 %   Neutrophils Relative % 83 %   Neutro Abs 6.5 1.7 - 7.7 K/uL   Lymphocytes Relative 8 %   Lymphs Abs 0.6 (L) 0.7 - 4.0 K/uL   Monocytes Relative 8 %   Monocytes Absolute 0.7 0 - 1 K/uL   Eosinophils Relative 0 %   Eosinophils Absolute 0.0 0 - 0 K/uL   Basophils Relative 0 %   Basophils Absolute 0.0 0 - 0 K/uL   Immature Granulocytes 1 %   Abs Immature Granulocytes 0.04 0.00 - 0.07 K/uL    Comment: Performed at [facility], [address], [place], [place] [address]  Basic metabolic panel     Status: Abnormal   Collection  Time: 09/04/19  5:31 AM  Result Value Ref Range   Sodium 141 135 - 145 mmol/L   Potassium 4.2 3.5 - 5.1 mmol/L   Chloride 104 98 - 111 mmol/L   CO2 29 22 - 32 mmol/L   Glucose, Bld 159 (H) 70 - 99 mg/dL    Comment: Glucose reference range applies only to samples taken after fasting for at least 8 hours.   BUN 30 (H) 8 - 23 mg/dL   Creatinine, Ser 1.19 (H) 0.44 - 1.00 mg/dL   Calcium 8.6 (L) 8.9 - 10.3 mg/dL   GFR calc non Af Amer 47 (L) >60 mL/min   GFR calc Af Amer 54 (L) >60 mL/min   Anion gap 8 5 - 15    Comment: Performed at [facility], [address] [place], [place] [address]  ESR     Status: Abnormal   Collection Time: 09/04/19  5:31 AM  Result Value Ref Range   Sed Rate 60 (H) 0 - 30 mm/hr  Comment: Performed at [facility], [address] [place], [place] [address]  Glucose, capillary     Status: Abnormal   Collection Time: 09/04/19  7:37 AM  Result Value Ref Range   Glucose-Capillary 130 (H) 70 - 99 mg/dL    Comment: Glucose reference range applies only to samples taken after fasting for at least 8 hours.   CT Angio Chest PE W and/or Wo Contrast  Result Date: 09/02/2019 CLINICAL DATA:  Shortness of breath EXAM: CT ANGIOGRAPHY CHEST WITH CONTRAST TECHNIQUE: Multidetector CT imaging of the chest was performed using the standard protocol during bolus administration of intravenous contrast. Multiplanar CT image reconstructions and MIPs were obtained to evaluate the vascular anatomy. CONTRAST:  71m OMNIPAQUE IOHEXOL 350 MG/ML SOLN COMPARISON:  07/28/2018 FINDINGS: Cardiovascular: Satisfactory opacification of the pulmonary arteries to the segmental level. No evidence of pulmonary embolism. Cardiomegaly. No pericardial effusion. Mediastinum/Nodes: No enlarged mediastinal, hilar, or axillary lymph nodes. Thyroid gland, trachea, and esophagus demonstrate no significant findings. Lungs/Pleura: Bandlike scarring of the right lung base with elevation of the  right hemidiaphragm. Heterogeneous airspace opacity of the right middle lobe (series 6, image 50). No pleural effusion or pneumothorax. Upper Abdomen: No acute abnormality. Partially imaged findings of partial gastrectomy in the included upper abdomen. Musculoskeletal: No chest wall abnormality. No acute or significant osseous findings. Review of the MIP images confirms the above findings. IMPRESSION: 1. Negative examination for pulmonary embolism. 2. Heterogeneous airspace opacity of the right middle lobe concerning for infection. 3. Cardiomegaly. Electronically Signed   By: AEddie CandleM.D.   On: 09/02/2019 13:32   DG Chest Port 1 View  Result Date: 09/02/2019 CLINICAL DATA:  Altered mental status with weakness EXAM: PORTABLE CHEST 1 VIEW COMPARISON:  Chest radiograph and chest CT Jul 28, 2018 FINDINGS: There is stable elevation of the right hemidiaphragm. No edema or airspace opacity. Heart size and pulmonary vascularity normal. No adenopathy. There is a total shoulder replacement on the right. IMPRESSION: Elevation right hemidiaphragm, stable. No edema or airspace opacity. Stable cardiac silhouette. No adenopathy. Electronically Signed   By: WLowella GripIII M.D.   On: 09/02/2019 11:30   DG Foot 2 Views Left  Result Date: 09/03/2019 CLINICAL DATA:  Foot pain, unable to bear weight on left foot EXAM: LEFT FOOT - 2 VIEW; RIGHT FOOT - 2 VIEW COMPARISON:  05/29/2010 FINDINGS: No fracture or dislocation of the bilateral feet. There is a bunion deformity of the left great toe with crossover hammertoe deformity of the left second toe. Severe left first metatarsophalangeal arthrosis. Moderate bilateral midfoot arthrosis. Prior right fifth metatarsal osteotomy. Soft tissues are unremarkable. IMPRESSION: 1. No fracture or dislocation of the bilateral feet. 2. Severe left first metatarsophalangeal arthrosis. 3. Moderate bilateral midfoot arthrosis. Electronically Signed   By: AEddie CandleM.D.   On: 09/03/2019  15:26   DG Foot 2 Views Right  Result Date: 09/03/2019 CLINICAL DATA:  Foot pain, unable to bear weight on left foot EXAM: LEFT FOOT - 2 VIEW; RIGHT FOOT - 2 VIEW COMPARISON:  05/29/2010 FINDINGS: No fracture or dislocation of the bilateral feet. There is a bunion deformity of the left great toe with crossover hammertoe deformity of the left second toe. Severe left first metatarsophalangeal arthrosis. Moderate bilateral midfoot arthrosis. Prior right fifth metatarsal osteotomy. Soft tissues are unremarkable. IMPRESSION: 1. No fracture or dislocation of the bilateral feet. 2. Severe left first metatarsophalangeal arthrosis. 3. Moderate bilateral midfoot arthrosis. Electronically Signed   By: AEddie Candle  M.D.   On: 09/03/2019 15:26   ECHOCARDIOGRAM COMPLETE  Result Date: 09/03/2019    ECHOCARDIOGRAM REPORT   Patient Name:   ASA FATH Date of Exam: 09/03/2019 Medical Rec #:  [id number]       Height:       65.0 in Accession #:    [id number]      Weight:       253.3 lb Date of Birth:  [birth year]       BSA:          2.187 m Patient Age:    12 years        BP:           136/66 mmHg Patient Gender: F               HR:           89 bpm. Exam Location:  [facility] Procedure: 2D Echo Indications:     Chest Pain R07.9  History:         Patient has no prior history of Echocardiogram examinations.  Sonographer:     Arville Go RDCS Referring Phys:  [id number] AGBATA Diagnosing Phys: Kate Sable MD IMPRESSIONS  1. Left ventricular ejection fraction, by estimation, is 60 to 65%. The left ventricle has normal function. The left ventricle has no regional wall motion abnormalities. Left ventricular diastolic parameters were normal.  2. Right ventricular systolic function is normal. The right ventricular size is normal.  3. The mitral valve is normal in structure. No evidence of mitral valve regurgitation. No evidence of mitral stenosis.  4. The aortic valve is normal in structure. Aortic valve regurgitation is  not visualized. No aortic stenosis is present.  5. The inferior vena cava is normal in size with <50% respiratory variability, suggesting right atrial pressure of 8 mmHg. FINDINGS  Left Ventricle: Left ventricular ejection fraction, by estimation, is 60 to 65%. The left ventricle has normal function. The left ventricle has no regional wall motion abnormalities. The left ventricular internal cavity size was normal in size. There is  no left ventricular hypertrophy. Left ventricular diastolic parameters were normal. Right Ventricle: The right ventricular size is normal. No increase in right ventricular wall thickness. Right ventricular systolic function is normal. Left Atrium: Left atrial size was normal in size. Right Atrium: Right atrial size was normal in size. Pericardium: There is no evidence of pericardial effusion. Mitral Valve: The mitral valve is normal in structure. Normal mobility of the mitral valve leaflets. No evidence of mitral valve regurgitation. No evidence of mitral valve stenosis. MV peak gradient, 6.9 mmHg. The mean mitral valve gradient is 3.0 mmHg. Tricuspid Valve: The tricuspid valve is normal in structure. Tricuspid valve regurgitation is mild . No evidence of tricuspid stenosis. Aortic Valve: The aortic valve is normal in structure. Aortic valve regurgitation is not visualized. No aortic stenosis is present. Pulmonic Valve: The pulmonic valve was not well visualized. Pulmonic valve regurgitation is not visualized. No evidence of pulmonic stenosis. Aorta: The aortic root is normal in size and structure. Venous: The inferior vena cava is normal in size with less than 50% respiratory variability, suggesting right atrial pressure of 8 mmHg. IAS/Shunts: No atrial level shunt detected by color flow Doppler.  LEFT VENTRICLE PLAX 2D LVIDd:         4.79 cm  Diastology LVIDs:         3.15 cm  LV e' lateral:   10.30 cm/s LV PW:  1.16 cm  LV E/e' lateral: 10.3 LV IVS:        1.29 cm  LV e' medial:     8.49 cm/s LVOT diam:     1.90 cm  LV E/e' medial:  12.5 LV SV:         59 LV SV Index:   27 LVOT Area:     2.84 cm  RIGHT VENTRICLE RV Basal diam:  3.78 cm RV S prime:     13.90 cm/s LEFT ATRIUM             Index LA diam:        4.80 cm 2.19 cm/m LA Vol (A2C):   36.6 ml 16.73 ml/m LA Vol (A4C):   39.8 ml 18.20 ml/m LA Biplane Vol: 39.9 ml 18.24 ml/m  AORTIC VALVE             PULMONIC VALVE LVOT Vmax:   88.10 cm/s  PV Vmax:       1.28 m/s LVOT Vmean:  62.600 cm/s PV Peak grad:  6.6 mmHg LVOT VTI:    0.207 m  AORTA Ao Root diam: 3.20 cm Ao Asc diam:  3.00 cm MITRAL VALVE                TRICUSPID VALVE MV Area (PHT): 4.31 cm     TV Peak grad:   26.5 mmHg MV Peak grad:  6.9 mmHg     TV Vmax:        2.58 m/s MV Mean grad:  3.0 mmHg MV Vmax:       1.31 m/s     SHUNTS MV Vmean:      79.8 cm/s    Systemic VTI:  0.21 m MV Decel Time: 176 msec     Systemic Diam: 1.90 cm MV E velocity: 106.00 cm/s MV A velocity: 96.80 cm/s MV E/A ratio:  1.10 Kate Sable MD Electronically signed by Kate Sable MD Signature Date/Time: 09/03/2019/12:15:17 PM    Final     Blood pressure (!) 163/82, pulse 70, temperature 97.8 F (36.6 C), resp. rate 18, height '5\' 5"'  (1.651 m), weight 115.3 kg, SpO2 97 %.  Assessment 1. Left plantar calcaneal subcutaneous ulceration 2. Calcaneal gait left 3. Diabetes type 2 with polyneuropathy 4. PVD  Plan -Patient seen and examined. -X-rays reviewed and discussed with patient in detail. -Only appears to have a wound to the plantar aspect the left heel which is recurrent ulceration for the patient.  No signs of infection are present today.  Gauze debridement performed to healthy bleeding granular base. -Applied offloading felt, mupirocin cream, and Band-Aid. -Patient to have heels offloaded in bed with pillows or Prevalon boots. -Continue dressing changes every other day consisting of removing the Band-Aid, applying more mupirocin cream to the wound, leaving the felt pads in  place, then reapplied a Band-Aid. -Discussed with patient need for further offloading of the heel and that this is always going to be a recurrent problem for the patient as she has a calcaneal gait due to your Achilles tendon injury in the past.  Patient understands.  Podiatry team to sign off at this time.  Patient can continue follow-up with Dr. [name] in clinic.  Contact if any further problems arise.  Caroline More, DPM 09/04/2019, 8:19 AM

## 2019-09-04 NOTE — Progress Notes (Signed)
PT Cancellation Note  Patient Details Name: [name] MRN: [id number] DOB: 04-Jul-1950   Cancelled Treatment:    Reason Eval/Treat Not Completed:  (Consult received and chart reviewed.  Per primary RN, patient with onset of bleeding to L heel with standing attempts earlier in the day.  Attempt made to contact podiatry to discuss need/benefit of offloading/protective shoe to L heel with standing attempts.  Will hold standing/mobilty assessment until clarification received.  RN aware and in agreement.)  [name], PT, DPT, NCS 09/04/19, 2:10 PM [phone]

## 2019-09-05 DIAGNOSIS — R918 Other nonspecific abnormal finding of lung field: Secondary | ICD-10-CM

## 2019-09-05 DIAGNOSIS — B951 Streptococcus, group B, as the cause of diseases classified elsewhere: Secondary | ICD-10-CM

## 2019-09-05 DIAGNOSIS — N3946 Mixed incontinence: Secondary | ICD-10-CM

## 2019-09-05 DIAGNOSIS — S81801A Unspecified open wound, right lower leg, initial encounter: Secondary | ICD-10-CM

## 2019-09-05 LAB — CULTURE, BLOOD (ROUTINE X 2)
Special Requests: ADEQUATE
Special Requests: ADEQUATE

## 2019-09-05 LAB — GLUCOSE, CAPILLARY
Glucose-Capillary: 122 mg/dL — ABNORMAL HIGH (ref 70–99)
Glucose-Capillary: 155 mg/dL — ABNORMAL HIGH (ref 70–99)
Glucose-Capillary: 146 mg/dL — ABNORMAL HIGH (ref 70–99)
Glucose-Capillary: 152 mg/dL — ABNORMAL HIGH (ref 70–99)

## 2019-09-05 NOTE — Progress Notes (Signed)
PT Cancellation Note  Patient Details Name: [name] MRN: [id number] DOB: 11/08/1950   Cancelled Treatment:    Reason Eval/Treat Not Completed:  (Evaluation re-attempted this date.  Patient politely declines session this AM, reporting "bad night" and "just not feeling up to it" at this time.  Reviewed role of PT, orders for L LE post-op shoe and importance of PT eval for discharge planning. Patient voiced understanding, but continues to decline mobility assessment at this time.  Does express interest in rehab upon discharge and requests therapist re-attempt evaluation at later time/date as appropriate.)  Felisia Balcom H. Owens Shark, PT, DPT, NCS 09/05/19, 10:01 AM [phone]

## 2019-09-05 NOTE — Discharge Instructions (Signed)
Podiatry discharge instructions: 1.  Try to stay off of the left foot as much as possible to avoid pressure on the heel to ensure proper wound healing. 2. Apply mupiricin cream to wound every other day along with dressing changes consisting of felt pads to the periwound, 4x4, kerlix, ace wrap. 3. Continue usage of offloading surgical shoe 4. Followup with Dr. [name] within 1 week of your discharge date.

## 2019-09-05 NOTE — Care Management Important Message (Signed)
Important Message  Patient Details  Name: [name] MRN: [id number] Date of Birth: [birth year]   Medicare Important Message Given:  Yes  Reviewed verbally with patient over room phone due to isolation status.  Declined hardcopy of Medicare IM at this time.    [name] 09/05/2019, 11:21 AM

## 2019-09-05 NOTE — Consult Note (Signed)
NAME: [name]  DOB: 07/07/1950  MRN: [id number]  Date/Time: 09/05/2019 4:17 PM  REQUESTING PROVIDER: Dr. Earnest Conroy Subjective:  REASON FOR CONSULT: Bacteremia ? [name] is a 69 y.o. female with a history of diabetes mellitus, obesity status post gastric sleeve surgery, bilateral venous edema and venous dermatitis, chronic left foot ulcer followed by podiatrist, history of left knee arthroplasty, history of shoulder replacement COPD, hypertension, presented to the ED on 09/02/2019 through EMS because of an inability to get up from the floor and feeling weak and fatigued.  She was also running a temperature of 100.8.She started with these symptoms 2 days before presentation. She received Tylenol and albuterol treatment with EMS. In the ED her temperature was 99.7, heart rate of 80, BP of 156/78, pulse ox of 93%. Labs revealed a WBC of 12.2, hemoglobin of 10.2, platelet of 251, creatinine of 1.26, blood glucose of 237, troponin of 120 and BNP of 384. On admission she was diagnosed as community-acquired pneumonia because of a history of 2 days of shortness of breath with fever chills and a productive cough.  CT scan of the chest done showed a right middle lobe pneumonia so she was started on Rocephin and Zithromax after blood cultures were sent.  The blood cultures is group B streptococcus for which I am consulted. . She also has chronic left heel ulcer for which podiatry saw her. There was also another ulcer on the right medial malleolar area which was superficial. As per patient this wound developed 2-3 weeks ago when the special boot she was wearing for the left leg rubbed on the rt leg. She also has a cough for the past week and her husband had bronchitis before   Past Medical History:  Diagnosis Date  . Abnormal antibody titer   . Anxiety and depression   . Arthritis   . Asthma   . Cystocele   . Depression   . Diabetes ([place])   . DVT (deep venous thrombosis) (HCC)    Righ calf    . Dyspnea    11/08/2018 - "more now due to lack of exercise"  . Dysrhythmia    afib  . Edema   . GERD (gastroesophageal reflux disease)   . Heart murmur    Echocardiogram June 2015: Mild MR (possible vegetation seen on TTE, not seen on TEE), normal LV size with moderate concentric LVH. Normal function EF 60-65%. Normal diastolic function. Mild LA dilation.  Marland Kitchen History of IBS   . History of kidney stones   . History of methicillin resistant staphylococcus aureus (MRSA) 2008  . Hyperlipidemia   . Hypertension   . Hypothyroidism   . Insomnia   . Kidney stone    kidney stones with lithotripsy .  [facility]- "adrenal glands"  . Neuropathy involving both lower extremities   . Obesity, Class II, BMI 35-39.9, with comorbidity   . Paroxysmal atrial fibrillation ([place]) 2007   In June 2015, Cardiac Event Monitor: Mostly SR/sinus arrhythmia with PVCs that are frequent. Short bursts of A. fib lasting several minutes;; CHA2DS2-VASc Score = 5 (age, Female, PVD, DM, HTN)  . Peripheral vascular disease ([place])   . Sleep apnea    use C-PAP  . Urinary incontinence   . Venous stasis dermatitis of both lower extremities     Past Surgical History:  Procedure Laterality Date  . ANKLE SURGERY Right   . APPLICATION OF WOUND VAC Left 06/27/2015   Procedure: APPLICATION OF WOUND VAC ( POSSIBLE ) ;  Surgeon: [name], MD;  Location: [facility] ORS;  Service: Vascular;  Laterality: Left;  . APPLICATION OF WOUND VAC Left 09/25/2016   Procedure: APPLICATION OF WOUND VAC;  Surgeon: [name], DPM;  Location: [facility];  Service: Podiatry;  Laterality: Left;  . arm surgery     right    . CHOLECYSTECTOMY    . COLONOSCOPY    . COLONOSCOPY WITH PROPOFOL N/A 01/11/2017   Procedure: COLONOSCOPY WITH PROPOFOL;  Surgeon: [name], MD;  [facility];  Service: Endoscopy;  Laterality: N/A;  . COLONOSCOPY WITH PROPOFOL N/A 02/22/2017   Procedure: COLONOSCOPY WITH PROPOFOL;  Surgeon: [name], MD;  Location: [facility] ENDOSCOPY;  Service: Endoscopy;  Laterality: N/A;  . COLONOSCOPY WITH PROPOFOL N/A 05/31/2017   Procedure: COLONOSCOPY WITH PROPOFOL;  Surgeon: [name], MD;  Location: [facility] ENDOSCOPY;  Service: Endoscopy;  Laterality: N/A;  . ESOPHAGOGASTRODUODENOSCOPY (EGD) WITH PROPOFOL N/A 01/11/2017   Procedure: ESOPHAGOGASTRODUODENOSCOPY (EGD) WITH PROPOFOL;  Surgeon: [name], MD;  Location: [facility] ENDOSCOPY;  Service: Endoscopy;  Laterality: N/A;  . ESOPHAGOGASTRODUODENOSCOPY (EGD) WITH PROPOFOL N/A 10/25/2018   Procedure: ESOPHAGOGASTRODUODENOSCOPY (EGD) WITH PROPOFOL;  Surgeon: [name], MD;  Location: [facility] ENDOSCOPY;  Service: Endoscopy;  Laterality: N/A;  . HERNIA REPAIR     umbilical  . HIATAL HERNIA REPAIR    . I & D EXTREMITY Left 06/27/2015   Procedure: IRRIGATION AND DEBRIDEMENT EXTREMITY            ( CALF HEMATOMA ) POSSIBLE WOUND VAC;  Surgeon: [name], MD;  Location: [facility];  Service: Vascular;  Laterality: Left;  . INCISION AND DRAINAGE OF WOUND Left 09/25/2016   Procedure: IRRIGATION AND DEBRIDEMENT - PARTIAL RESECTION OF ACHILLES TENDON WITH WOUND VAC APPLICATION;  Surgeon: [name], DPM;  Location: [facility];  Service: Podiatry;  Laterality: Left;  . INCISION AND DRAINAGE OF WOUND Left 11/09/2018   Procedure: Excision of left foot wound with ACell placement;  Surgeon: Wallace Going, DO;  Location: [place];  Service: Plastics;  Laterality: Left;  . IRRIGATION AND DEBRIDEMENT ABSCESS Left 09/07/2016   Procedure: IRRIGATION AND DEBRIDEMENT ABSCESS LEFT HEEL;  Surgeon: [name], DPM;  Location: [facility];  Service: Podiatry;  Laterality: Left;  . JOINT REPLACEMENT  2013   left knee replacement  . KIDNEY SURGERY Right    kidney stones  . LITHOTRIPSY    . LOWER EXTREMITY ANGIOGRAPHY Left 04/04/2018   Procedure: LOWER EXTREMITY ANGIOGRAPHY;  Surgeon: [name], MD;  Location: [facility];  Service:  Cardiovascular;  Laterality: Left;  . NM GATED MYOVIEW ([place] [facility])  February 2017   Likely breast attenuation. LOW RISK study. Normal EF 55-60%.  . OTHER SURGICAL HISTORY     08/2016 or 09/2016 surgery on achilles tenso h/o staph infection heal. Dr. [name]  . removal of left hematoma Left    leg  . REVERSE SHOULDER ARTHROPLASTY Right 10/08/2015   Procedure: REVERSE SHOULDER ARTHROPLASTY;  Surgeon: [name], MD;  Location: [facility];  Service: Orthopedics;  Laterality: Right;  . SLEEVE GASTROPLASTY    . TONSILLECTOMY    . TOTAL KNEE ARTHROPLASTY Left   . TOTAL SHOULDER REPLACEMENT Right   . TRANSESOPHAGEAL ECHOCARDIOGRAM  08/08/2013   Mild LVH, EF 60-65%. Moderate LA dilation and mild RA dilation. Mild MR with no evidence of stenosis and no evidence of endocarditis. A false chordae is noted.  . TRANSTHORACIC ECHOCARDIOGRAM  08/03/2013   Mild-Moderate  concentric LVH, EF 60-65%. Normal diastolic function. Mild LA dilation. Mild MR with possible vegetation  - not confirmed on TEE   . ULNAR NERVE TRANSPOSITION Right 08/06/2016   Procedure: ULNAR NERVE DECOMPRESSION/TRANSPOSITION;  Surgeon: [name], MD;  Location: [facility];  Service: Orthopedics;  Laterality: Right;  . UPPER GI ENDOSCOPY    . VAGINAL HYSTERECTOMY      Social History   Socioeconomic History  . Marital status: Married    Spouse name: Not on file  . Number of children: Not on file  . Years of education: Not on file  . Highest education level: Not on file  Occupational History  . Not on file  Tobacco Use  . Smoking status: Never Smoker  . Smokeless tobacco: Never Used  Vaping Use  . Vaping Use: Never used  Substance and Sexual Activity  . Alcohol use: No    Alcohol/week: 0.0 standard drinks  . Drug use: Never  . Sexual activity: Yes  Other Topics Concern  . Not on file  Social History Narrative   She is currently married -- for 30+ years. Does not work. Does not smoke or take alcohol. She never smoked. She  exercises at least 3 days a week since before her gastric surgery.   Marital status reviewed in history of present illness.   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 Difficulty of Paying Living Expenses:   Food Insecurity:   . Worried About Charity fundraiser in the Last Year:   . Arboriculturist in the Last Year:   Transportation Needs:   . Film/video editor (Medical):   Marland Kitchen Lack of Transportation (Non-Medical):   Physical Activity:   . Days of Exercise per Week:   . Minutes of Exercise per Session:   Stress:   . Feeling of Stress :   Social Connections:   . Frequency of Communication with Friends and Family:   . Frequency of Social Gatherings with Friends and Family:   . Attends Religious Services:   . Active Member of Clubs or Organizations:   . Attends Archivist Meetings:   Marland Kitchen Marital Status:   Intimate Partner Violence:   . Fear of Current or Ex-Partner:   . Emotionally Abused:   Marland Kitchen Physically Abused:   . Sexually Abused:     Family History  Problem Relation Age of Onset  . Skin cancer Father   . Diabetes Father   . Hypertension Father   . Peripheral vascular disease Father   . Cancer Father   . Cerebral aneurysm Father   . Alcohol abuse Father   . Varicose Veins Mother   . Kidney disease Mother   . Arthritis Mother   . Mental illness Sister   . Cancer Maternal Aunt        breast  . Breast cancer Maternal Aunt   . Arthritis Maternal Grandmother   . Hypertension Maternal Grandmother   . Diabetes Maternal Grandmother   . Arthritis Maternal Grandfather   . Heart disease Maternal Grandfather   . Hypertension Maternal Grandfather   . Arthritis Paternal Grandmother   . Hypertension Paternal Grandmother   . Diabetes Paternal Grandmother   . Arthritis Paternal Grandfather   . Hypertension Paternal Grandfather   . Bladder Cancer  Neg Hx   . Kidney  cancer Neg Hx    Allergies  Allergen Reactions  . Morphine And Related Other (See Comments) and Nausea Only    Patient becomes very confused Other reaction(s): Other (See Comments), Other (See Comments) Patient becomes very confused Patient becomes very confused   . Penicillins Hives, Shortness Of Breath, Swelling and Other (See Comments)    Facial swelling Has patient had a PCN reaction causing immediate rash, facial/tongue/throat swelling, SOB or lightheadedness with hypotension: Yes Has patient had a PCN reaction causing severe rash involving mucus membranes or skin necrosis: Yes Has patient had a PCN reaction that required hospitalization No Has patient had a PCN reaction occurring within the last 10 years: No If all of the above answers are "NO", then may proceed with Cephalosporin use.  Other reaction(s): Other (See Comments), Other (See Comments) Facial swelling Has patient had a PCN reaction causing immediate rash, facial/tongue/throat swelling, SOB or lightheadedness with hypotension: Yes Has patient had a PCN reaction causing severe rash involving mucus membranes or skin necrosis: Yes Has patient had a PCN reaction that required hospitalization No Has patient had a PCN reaction occurring within the last 10 years: No If all of the above answers are "NO", then may proceed with Cephalosporin use. Facial swelling Has patient had a PCN reaction causing immediate rash, facial/tongue/throat swelling, SOB or lightheadedness with hypotension: Yes Has patient had a PCN reaction causing severe rash involving mucus membranes or skin necrosis: Yes Has patient had a PCN reaction that required hospitalization No Has patient had a PCN reaction occurring within the last 10 years: No If all of the above answers are "NO", then may proceed with Cephalosporin use. Facial swelling Has patient had a PCN reaction causing immediate rash, facial/tongue/throat swelling, SOB or  lightheadedness with hypotension: Yes Has patient had a PCN reaction causing severe rash involving mucus membranes or skin necrosis: Yes Has patient had a PCN reaction that required hospitalization No Has patient had a PCN reaction occurring within the last 10 years: No If all of the above answers are "NO", then may proceed with Cephalosporin use.   . Ambien [Zolpidem]     Hallucination   . Aspirin Hives  . Oxytetracycline Hives    Other reaction(s): Unknown     ? Current Facility-Administered Medications  Medication Dose Route Frequency Provider Last Rate Last Admin  . 0.9 %  sodium chloride infusion  250 mL Intravenous PRN Agbata, Tochukwu, MD      . acetaminophen (TYLENOL) tablet 650 mg  650 mg Oral Q6H PRN Oswald Hillock, RPH       Or  . acetaminophen (TYLENOL) suppository 650 mg  650 mg Rectal Q6H PRN Oswald Hillock, RPH      . albuterol (PROVENTIL) (2.5 MG/3ML) 0.083% nebulizer solution 2.5 mg  2.5 mg Nebulization Q6H PRN Agbata, Tochukwu, MD      . ALPRAZolam Duanne Moron) tablet 0.5 mg  0.5 mg Oral BID PRN Agbata, Tochukwu, MD   0.5 mg at 09/04/19 2134  . amLODipine (NORVASC) tablet 5 mg  5 mg Oral Daily Marrianne Mood D, PA-C   5 mg at 09/05/19 0841  . buPROPion (WELLBUTRIN XL) 24 hr tablet 150 mg  150 mg Oral Daily Agbata, Tochukwu, MD   150 mg at 09/05/19 0841  . cefTRIAXone (ROCEPHIN) 2 g in sodium chloride 0.9 % 100 mL IVPB  2 g Intravenous Q24H Oswald Hillock, RPH 200 mL/hr at 09/04/19 1600 2 g at 09/04/19 1600  . colchicine tablet 0.6 mg  0.6 mg Oral Daily PRN Agbata, Tochukwu, MD      . dabigatran (PRADAXA) capsule 150 mg  150 mg Oral Q12H Kate Sable, MD   150 mg at 09/05/19 0842  . DULoxetine (CYMBALTA) DR capsule 60 mg  60 mg Oral Daily Agbata, Tochukwu, MD   60 mg at 09/05/19 0841  . estradiol (ESTRACE) vaginal cream 1 Applicatorful  1 Applicatorful Vaginal Q M,W,F Agbata, Tochukwu, MD   1 Applicatorful at 09/32/67 0927  . fesoterodine (TOVIAZ) tablet 8 mg  8  mg Oral Daily Agbata, Tochukwu, MD   8 mg at 09/05/19 0842  . losartan (COZAAR) tablet 50 mg  50 mg Oral Daily Rito Ehrlich A, RPH   50 mg at 09/05/19 1245   And  . hydrochlorothiazide (MICROZIDE) capsule 12.5 mg  12.5 mg Oral Daily Rito Ehrlich A, RPH   12.5 mg at 09/05/19 0840  . HYDROcodone-acetaminophen (NORCO/VICODIN) 5-325 MG per tablet 1 tablet  1 tablet Oral Q8H PRN Agbata, Tochukwu, MD      . insulin aspart (novoLOG) injection 0-20 Units  0-20 Units Subcutaneous TID WC Agbata, Tochukwu, MD   4 Units at 09/05/19 1216  . ipratropium-albuterol (DUONEB) 0.5-2.5 (3) MG/3ML nebulizer solution 3 mL  3 mL Nebulization BID Florencia Reasons, MD   3 mL at 09/05/19 0739  . levothyroxine (SYNTHROID) tablet 175 mcg  175 mcg Oral QAC breakfast Agbata, Tochukwu, MD   175 mcg at 09/05/19 0550  . loratadine (CLARITIN) tablet 10 mg  10 mg Oral Daily Agbata, Tochukwu, MD   10 mg at 09/05/19 0841  . metoprolol succinate (TOPROL-XL) 24 hr tablet 50 mg  50 mg Oral Daily Agbata, Tochukwu, MD   50 mg at 09/05/19 0841  . mometasone-formoterol (DULERA) 200-5 MCG/ACT inhaler 2 puff  2 puff Inhalation BID Agbata, Tochukwu, MD   2 puff at 09/05/19 0842  . montelukast (SINGULAIR) tablet 10 mg  10 mg Oral QHS Agbata, Tochukwu, MD   10 mg at 09/04/19 2130  . nystatin (MYCOSTATIN/NYSTOP) topical powder   Topical BID Athena Masse, MD   Given at 09/05/19 [phone]  . ondansetron (ZOFRAN) tablet 4 mg  4 mg Oral Q6H PRN Agbata, Tochukwu, MD       Or  . ondansetron (ZOFRAN) injection 4 mg  4 mg Intravenous Q6H PRN Agbata, Tochukwu, MD      . pantoprazole (PROTONIX) EC tablet 40 mg  40 mg Oral Daily Agbata, Tochukwu, MD   40 mg at 09/05/19 0841  . pregabalin (LYRICA) capsule 50 mg  50 mg Oral TID Collier Bullock, MD   50 mg at 09/05/19 0841  . sodium chloride flush (NS) 0.9 % injection 3 mL  3 mL Intravenous Q12H Agbata, Tochukwu, MD   3 mL at 09/05/19 0843  . sodium chloride flush (NS) 0.9 % injection 3 mL  3 mL Intravenous PRN  Agbata, Tochukwu, MD      . Derrill Memo ON 09/08/2019] Vitamin D (Ergocalciferol) (DRISDOL) capsule 50,000 Units  50,000 Units Oral Weekly Agbata, Tochukwu, MD         Abtx:  Anti-infectives (From admission, onward)   Start     Dose/Rate Route Frequency Ordered Stop   09/03/19 1800  azithromycin (ZITHROMAX) 500 mg in sodium chloride 0.9 % 250 mL IVPB  Status:  Discontinued        500 mg 250 mL/hr over 60 Minutes Intravenous Every 24 hours 09/03/19 0601 09/04/19 1859   09/02/19 2015  fluconazole (DIFLUCAN) tablet 150 mg  150 mg Oral  Once 09/02/19 2007 09/02/19 2130   09/02/19 1700  cefTRIAXone (ROCEPHIN) 2 g in sodium chloride 0.9 % 100 mL IVPB     Discontinue     2 g 200 mL/hr over 30 Minutes Intravenous Every 24 hours 09/02/19 1415 09/13/19 2359   09/02/19 1700  azithromycin (ZITHROMAX) 500 mg in sodium chloride 0.9 % 250 mL IVPB  Status:  Discontinued        500 mg 250 mL/hr over 60 Minutes Intravenous Every 24 hours 09/02/19 1415 09/03/19 0550   09/02/19 1315  levofloxacin (LEVAQUIN) IVPB 500 mg  Status:  Discontinued        500 mg 100 mL/hr over 60 Minutes Intravenous Every 24 hours 09/02/19 1304 09/02/19 1356   09/02/19 1200  ceFEPIme (MAXIPIME) 2 g in sodium chloride 0.9 % 100 mL IVPB  Status:  Discontinued        2 g 200 mL/hr over 30 Minutes Intravenous  Once 09/02/19 1150 09/02/19 1156   09/02/19 1145  aztreonam (AZACTAM) 2 g in sodium chloride 0.9 % 100 mL IVPB  Status:  Discontinued        2 g 200 mL/hr over 30 Minutes Intravenous  Once 09/02/19 1131 09/02/19 1230   09/02/19 1145  metroNIDAZOLE (FLAGYL) IVPB 500 mg        500 mg 100 mL/hr over 60 Minutes Intravenous  Once 09/02/19 1131 09/02/19 1318   09/02/19 1145  vancomycin (VANCOCIN) IVPB 1000 mg/200 mL premix  Status:  Discontinued        1,000 mg 200 mL/hr over 60 Minutes Intravenous  Once 09/02/19 1131 09/02/19 1436      REVIEW OF SYSTEMS:  Const:  fever, negative chills, negative weight loss Eyes: negative  diplopia or visual changes, negative eye pain ENT: negative coryza, negative sore throat Resp:  cough, , dyspnea Cards: negative for chest pain, palpitations, lower extremity edema GU: has chronic mixed incontinence GI: Negative for abdominal pain, diarrhea, bleeding, constipation Skin: negative for rash and pruritus Heme:  easy bruising  MS: generalized weakness, poor mobility mostly in wheel chair or bed Neurolo:weakness legs Psych: anxiety, Endocrine: negative for thyroid, diabetes Allergy/Immunology-as above  Objective:  VITALS:  BP (!) 160/76 (BP Location: Left Arm)   Pulse 66   Temp 98.1 F (36.7 C) (Oral)   Resp 17   Ht 5\' 5"  (1.651 m)   Wt 115.3 kg   SpO2 92%   BMI 42.30 kg/m  PHYSICAL EXAM:  General: Alert, cooperative, no distress, appears stated age.  Head: Normocephalic, without obvious abnormality, atraumatic. Eyes: Conjunctivae clear, anicteric sclerae. Pupils are equal ENT Nares normal. No drainage or sinus tenderness. Lips, mucosa, and tongue normal. No Thrush Neck: Supple, symmetrical, no adenopathy, thyroid: non tender no carotid bruit and no JVD. Back: No CVA tenderness. Lungs: b/l air entry- few rhonchi  Heart: Regular rate and rhythm, no murmur, rub or gallop. Abdomen: Soft, non-tender,not distended. Bowel sounds normal. No masses Extremities: legs very thin and wasted Left heel ulcer- not grossly infected Rt medial malleous is superficial ulceration Rt medial thigh has erythema and some tenderness to touch  Rt leg   Rt thigh   rt leg posterior achilles      Skin: No rashes or lesions. Or bruising Lymph: Cervical, supraclavicular normal. Neurologic: wasting and weakness legs Pertinent Labs Lab Results CBC    Component Value Date/Time   WBC 7.8 09/04/2019 0531   RBC 3.41 (L) 09/04/2019 0531   HGB 9.1 (L)  09/04/2019 0531   HGB 12.5 10/10/2014 0822   HCT 28.1 (L) 09/04/2019 0531   HCT 36.3 10/10/2014 0822   PLT 219 09/04/2019 0531    PLT 263 10/10/2014 0822   MCV 82.4 09/04/2019 0531   MCV 83 10/10/2014 0822   MCV 94 09/28/2013 0510   MCH 26.7 09/04/2019 0531   MCHC 32.4 09/04/2019 0531   RDW 16.0 (H) 09/04/2019 0531   RDW 14.8 10/10/2014 0822   RDW 13.4 09/28/2013 0510   LYMPHSABS 0.6 (L) 09/04/2019 0531   LYMPHSABS 1.2 10/10/2014 0822   LYMPHSABS 1.4 09/28/2013 0510   MONOABS 0.7 09/04/2019 0531   MONOABS 0.5 09/28/2013 0510   EOSABS 0.0 09/04/2019 0531   EOSABS 0.4 10/10/2014 0822   EOSABS 0.4 09/28/2013 0510   BASOSABS 0.0 09/04/2019 0531   BASOSABS 0.1 10/10/2014 0822   BASOSABS 0.1 09/28/2013 0510   BASOSABS 0 09/29/2012 1500    CMP Latest Ref Rng & Units 09/04/2019 09/03/2019 09/02/2019  Glucose 70 - 99 mg/dL 159(H) 275(H) 237(H)  BUN 8 - 23 mg/dL 30(H) 25(H) 21  Creatinine 0.44 - 1.00 mg/dL 1.19(H) 1.25(H) 1.26(H)  Sodium 135 - 145 mmol/L 141 135 136  Potassium 3.5 - 5.1 mmol/L 4.2 3.8 4.0  Chloride 98 - 111 mmol/L 104 100 100  CO2 22 - 32 mmol/L 29 26 25   Calcium 8.9 - 10.3 mg/dL 8.6(L) 8.3(L) 8.5(L)  Total Protein 6.5 - 8.1 g/dL - - 6.8  Total Bilirubin 0.3 - 1.2 mg/dL - - 0.7  Alkaline Phos 38 - 126 U/L - - 74  AST 15 - 41 U/L - - 13(L)  ALT 0 - 44 U/L - - 9      Microbiology: Recent Results (from the past 240 hour(s))  Microscopic Examination     Status: Abnormal   Collection Time: 09/01/19  2:20 PM   Urine  Result Value Ref Range Status   WBC, UA >30 (H) 0 - 5 /hpf Final   RBC None seen 0 - 2 /hpf Final   Epithelial Cells (non renal) 0-10 0 - 10 /hpf Final   Bacteria, UA Many (A) None seen/Few Final  Culture, blood (Routine x 2)     Status: Abnormal   Collection Time: 09/02/19 10:44 AM   Specimen: BLOOD  Result Value Ref Range Status   Specimen Description   Final    BLOOD R UPPER ARM Performed at [facility], [address] [place], [place] [address]    Special Requests   Final    BOTTLES DRAWN AEROBIC AND ANAEROBIC Blood Culture adequate volume Performed at  [facility], [address] [place], [place] [address]    Culture  Setup Time   Final    Organism ID to follow IN BOTH AEROBIC AND ANAEROBIC BOTTLES GRAM POSITIVE COCCI CRITICAL RESULT CALLED TO, READ BACK BY AND VERIFIED WITH: SCOTT HALL ON 09/03/19 AT [place] [facility] Performed at [facility], [address] [place], [place] [address]    Culture GROUP B STREP(S.AGALACTIAE)ISOLATED (A)  Final   Report Status 09/05/2019 FINAL  Final   Organism ID, Bacteria GROUP B STREP(S.AGALACTIAE)ISOLATED  Final      Susceptibility   Group b strep(s.agalactiae)isolated - MIC*    CLINDAMYCIN >=1 RESISTANT Resistant     AMPICILLIN <=0.25 SENSITIVE Sensitive     ERYTHROMYCIN >=8 RESISTANT Resistant     VANCOMYCIN 0.5 SENSITIVE Sensitive     CEFTRIAXONE <=0.12 SENSITIVE Sensitive     LEVOFLOXACIN 1 SENSITIVE Sensitive     *  GROUP B STREP(S.AGALACTIAE)ISOLATED  Culture, blood (Routine x 2)     Status: Abnormal   Collection Time: 09/02/19 10:44 AM   Specimen: BLOOD  Result Value Ref Range Status   Specimen Description   Final    BLOOD RFA Performed at [facility], [address] [place], [place] [address]    Special Requests   Final    BOTTLES DRAWN AEROBIC AND ANAEROBIC Blood Culture adequate volume Performed at [facility], [address], [place], [place] [address]    Culture  Setup Time   Final    GRAM POSITIVE COCCI AEROBIC BOTTLE ONLY CRITICAL VALUE NOTED.  VALUE IS CONSISTENT WITH PREVIOUSLY REPORTED AND CALLED VALUE. Performed at [facility], [address] [place], [place] [address]    Culture (A)  Final    GROUP B STREP(S.AGALACTIAE)ISOLATED SUSCEPTIBILITIES PERFORMED ON PREVIOUS CULTURE WITHIN THE LAST 5 DAYS. Performed at [facility], [place] [address] [place], [place] [address]    Report Status 09/05/2019 FINAL  Final  Blood Culture ID Panel (Reflexed)     Status: Abnormal   Collection Time: 09/02/19 10:44 AM  Result Value  Ref Range Status   Enterococcus species NOT DETECTED NOT DETECTED Final   Listeria monocytogenes NOT DETECTED NOT DETECTED Final   Staphylococcus species NOT DETECTED NOT DETECTED Final   Staphylococcus aureus (BCID) NOT DETECTED NOT DETECTED Final   Streptococcus species DETECTED (A) NOT DETECTED Final    Comment: CRITICAL RESULT CALLED TO, READ BACK BY AND VERIFIED WITH: SCOTT HALL ON 09/03/19 AT 0437 [place]    Streptococcus agalactiae DETECTED (A) NOT DETECTED Final    Comment: CRITICAL RESULT CALLED TO, READ BACK BY AND VERIFIED WITH: SCOTT HALL ON 09/03/19 AT 0437 [place]    Streptococcus pneumoniae NOT DETECTED NOT DETECTED Final   Streptococcus pyogenes NOT DETECTED NOT DETECTED Final   Acinetobacter baumannii NOT DETECTED NOT DETECTED Final   Enterobacteriaceae species NOT DETECTED NOT DETECTED Final   Enterobacter cloacae complex NOT DETECTED NOT DETECTED Final   Escherichia coli NOT DETECTED NOT DETECTED Final   Klebsiella oxytoca NOT DETECTED NOT DETECTED Final   Klebsiella pneumoniae NOT DETECTED NOT DETECTED Final   Proteus species NOT DETECTED NOT DETECTED Final   Serratia marcescens NOT DETECTED NOT DETECTED Final   Haemophilus influenzae NOT DETECTED NOT DETECTED Final   Neisseria meningitidis NOT DETECTED NOT DETECTED Final   Pseudomonas aeruginosa NOT DETECTED NOT DETECTED Final   Candida albicans NOT DETECTED NOT DETECTED Final   Candida glabrata NOT DETECTED NOT DETECTED Final   Candida krusei NOT DETECTED NOT DETECTED Final   Candida parapsilosis NOT DETECTED NOT DETECTED Final   Candida tropicalis NOT DETECTED NOT DETECTED Final    Comment: Performed at [facility], [address] [place], [place] [address]  SARS Coronavirus 2 by RT PCR (hospital order, performed in [facility]) Nasopharyngeal Nasopharyngeal Swab     Status: None   Collection Time: 09/02/19  3:28 PM   Specimen: Nasopharyngeal Swab  Result Value Ref Range Status   SARS  Coronavirus 2 NEGATIVE NEGATIVE Final    Comment: (NOTE) SARS-CoV-2 target nucleic acids are NOT DETECTED.  The SARS-CoV-2 RNA is generally detectable in upper and lower respiratory specimens during the acute phase of infection. The lowest concentration of SARS-CoV-2 viral copies this assay can detect is 250 copies / mL. A negative result does not preclude SARS-CoV-2 infection and should not be used as the sole basis for treatment or  other patient management decisions.  A negative result may occur with improper specimen collection / handling, submission of specimen other than nasopharyngeal swab, presence of viral mutation(s) within the areas targeted by this assay, and inadequate number of viral copies (<250 copies / mL). A negative result must be combined with clinical observations, patient history, and epidemiological information.  Fact Sheet for Patients:   StrictlyIdeas.no  Fact Sheet for Healthcare Providers: BankingDealers.co.za  This test is not yet approved or  cleared by the [place] FDA and has been authorized for detection and/or diagnosis of SARS-CoV-2 by FDA under an Emergency Use Authorization (EUA).  This EUA will remain in effect (meaning this test can be used) for the duration of the COVID-19 declaration under Section 564(b)(1) of the Act, 21 U.S.C. section 360bbb-3(b)(1), unless the authorization is terminated or revoked sooner.  Performed at [facility], [address], [place], [place] [address]   Urine Culture     Status: Abnormal   Collection Time: 09/03/19  1:07 PM   Specimen: Urine, Random  Result Value Ref Range Status   Specimen Description   Final    URINE, RANDOM Performed at [facility], [address], [place], [place] [address]    Special Requests   Final    NONE Performed at [facility], [address] [place], [place] [address]    Culture MULTIPLE SPECIES  PRESENT, SUGGEST RECOLLECTION (A)  Final   Report Status 09/04/2019 FINAL  Final  CULTURE, BLOOD (ROUTINE X 2) w Reflex to ID Panel     Status: None (Preliminary result)   Collection Time: 09/04/19  9:13 AM   Specimen: BLOOD  Result Value Ref Range Status   Specimen Description BLOOD RIGHT ANTECUBITAL  Final   Special Requests   Final    BOTTLES DRAWN AEROBIC AND ANAEROBIC Blood Culture adequate volume   Culture   Final    NO GROWTH < 24 HOURS Performed at [facility], [address], [place], [place] [address]    Report Status PENDING  Incomplete  CULTURE, BLOOD (ROUTINE X 2) w Reflex to ID Panel     Status: None (Preliminary result)   Collection Time: 09/04/19  9:39 AM   Specimen: BLOOD  Result Value Ref Range Status   Specimen Description BLOOD LEFT WRIST  Final   Special Requests   Final    BOTTLES DRAWN AEROBIC AND ANAEROBIC Blood Culture results may not be optimal due to an inadequate volume of blood received in culture bottles   Culture   Final    NO GROWTH < 24 HOURS Performed at [facility], [address] [place],  [address]    Report Status PENDING  Incomplete    IMAGING RESULTS: Heterogeneous airspace opacity of the right middle lobe concerning for infection. I have personally reviewed the films ? Impression/Recommendation ? ?Group B streptococcus bacteremia likely source is rt leg wound and now with a patch of cellulitis over the rt thigh  Resp symptoms with infiltrate in the Rt lung questioning community-acquired pneumonia and being treated with ceftriaxone and Zithromax Could be viral as her husband had bronchitis before that.check procal The ceftriaxone treats the bacteremia as well She will need total of 10 days of IV antibiotic  Diabetes mellitus- management as per primary team  She does not have UTI- she has chronic mixed incontinence secondary to multiple factors including Genitourinary syndrome of  menopause  Obesity Poor mobility  Bilateral venous edema and venous dermatitis  Left knee arthroplasty ? ___________________________________________________ Discussed with patient, and her husband Note:  This document was prepared using Dragon voice recognition software and may include unintentional dictation errors.

## 2019-09-05 NOTE — Progress Notes (Signed)
PROGRESS NOTE    [name]  FSF:[id number]  DOB: 12-20-1950  PCP: [name], [name], MD Admit date:09/02/2019 69 y/o female with h/oanxiety, depression, diabetes mellitus, COPD, hypertension, morbid obesity who presents to the ED for evaluation of productive cough, chills, generalized weakness and shortness of breathfor 2 days.Patient had mild dysuria, was seen by urology as an outpatient and was told her urine was sterile. On the morning of her admission she was very weak and unable to get up out of bed. Her husband called EMS due to worsening weakness Per EMS patient had a temp of 100.8 when they arrived and was noted to be hypoxic-low 80s on RA. Patient was placed on oxygen supplementation with improvement in her pulse oximetry. She received Tylenol 975 mg x 1 dose as well as albuterol treatment enroute. ED Course: WBC 12.2with a left shift as well as pyuria. Trop 120,Chest x-ray shows no acute findings. EKG-minimal ST depression in the inferior leads.COVID screen -ve. Hospital course: Patient admitted to [facility] with heparin drip and IV abx.   Subjective:  Patient appears to be awake alert and oriented x3 but reports feeling weak and tired. She declined PT eval Husband and daughter at bedside today.   Objective: Vitals:   09/05/19 0433 09/05/19 0743 09/05/19 0748 09/05/19 1200  BP: (!) 156/82  (!) 159/77 (!) 160/76  Pulse: 73  64 66  Resp: _0 Temp: 97.9 F (36.6 C)  97.9 F (36.6 C) 98.1 F (36.7 C)  TempSrc: Oral   Oral  SpO2: 92% 92% 100% 92%  Weight:      Height:        Intake/Output Summary (Last 24 hours) at 09/05/2019 1526 Last data filed at 09/05/2019 0502 Gross per 24 hour  Intake 243 ml  Output 2500 ml  Net -2257 ml   Filed Weights   09/02/19 1058 09/03/19 0440 09/04/19 0600  Weight: 114.3 kg 114.9 kg 115.3 kg    Physical Examination:  General exam: Appears in no acute distress Respiratory system: Clear to auscultation. Respiratory  effort normal. Cardiovascular system: S1 & S2 heard, RRR. No JVD, murmurs, rubs, gallops or clicks. No pedal edema. Gastrointestinal system: Abdomen is nondistended, soft and nontender. Normal bowel sounds heard. Central nervous system: Alert and oriented. No new focal neurological deficits. Extremities/skin: Left heel superficial ulceration with no signs of active drainage/infection, old injury/chronic hyperpigmentation along left shin , left medial malleolar superficial pressure wound as below  Psychiatry: Judgement and insight appear normal. Mood & affect appropriate.         Data Reviewed: I have personally reviewed following labs and imaging studies  CBC: Recent Labs  Lab 09/02/19 1044 09/03/19 0238 09/04/19 0531  WBC 12.2* 7.8 7.8  NEUTROABS 10.9*  --  6.5  HGB 10.2* 9.7* 9.1*  HCT 32.6* 31.2* 28.1*  MCV 84.2 85.2 82.4  PLT 251 196 334   Basic Metabolic Panel: Recent Labs  Lab 09/02/19 1044 09/03/19 0238 09/04/19 0531  NA 136 135 141  K 4.0 3.8 4.2  CL 100 100 104  CO2 _1 GLUCOSE 237* 275* 159*  BUN 21 25* 30*  CREATININE 1.26* 1.25* 1.19*  CALCIUM 8.5* 8.3* 8.6*   GFR: Estimated Creatinine Clearance (by C-G formula based on SCr of 1.19 mg/dL (H)) Female: 56.6 mL/min (A) Female: 68.8 mL/min (A) Liver Function Tests: Recent Labs  Lab 09/02/19 1044  AST 13*  ALT 9  ALKPHOS 74  BILITOT 0.7  PROT  6.8  ALBUMIN 3.4*   No results for input(s): LIPASE, AMYLASE in the last 168 hours. No results for input(s): AMMONIA in the last 168 hours. Coagulation Profile: Recent Labs  Lab 09/02/19 1235  INR 1.5*   Cardiac Enzymes: No results for input(s): CKTOTAL, CKMB, CKMBINDEX, TROPONINI in the last 168 hours. BNP (last 3 results) No results for input(s): PROBNP in the last 8760 hours. HbA1C: Recent Labs    09/02/19 1557  HGBA1C 6.8*   CBG: Recent Labs  Lab 09/04/19 1144 09/04/19 1632 09/04/19 2124 09/05/19 0750 09/05/19 1201  GLUCAP 133*  115* 162* 122* 155*   Lipid Profile: No results for input(s): CHOL, HDL, LDLCALC, TRIG, CHOLHDL, LDLDIRECT in the last 72 hours. Thyroid Function Tests: No results for input(s): TSH, T4TOTAL, FREET4, T3FREE, THYROIDAB in the last 72 hours. Anemia Panel: No results for input(s): VITAMINB12, FOLATE, FERRITIN, TIBC, IRON, RETICCTPCT in the last 72 hours. Sepsis Labs: Recent Labs  Lab 09/02/19 1044  LATICACIDVEN 1.2    Recent Results (from the past 240 hour(s))  Microscopic Examination     Status: Abnormal   Collection Time: 09/01/19  2:20 PM   Urine  Result Value Ref Range Status   WBC, UA >30 (H) 0 - 5 /hpf Final   RBC None seen 0 - 2 /hpf Final   Epithelial Cells (non renal) 0-10 0 - 10 /hpf Final   Bacteria, UA Many (A) None seen/Few Final  Culture, blood (Routine x 2)     Status: Abnormal   Collection Time: 09/02/19 10:44 AM   Specimen: BLOOD  Result Value Ref Range Status   Specimen Description   Final    BLOOD R UPPER ARM Performed at [facility], [address], [place], [place] [address]    Special Requests   Final    BOTTLES DRAWN AEROBIC AND ANAEROBIC Blood Culture adequate volume Performed at [facility], [address] [place], [place] [address]    Culture  Setup Time   Final    Organism ID to follow IN BOTH AEROBIC AND ANAEROBIC BOTTLES GRAM POSITIVE COCCI CRITICAL RESULT CALLED TO, READ BACK BY AND VERIFIED WITH: SCOTT HALL ON 09/03/19 AT [facility] Performed at [facility], [address] [place], [place] [address]    Culture GROUP B STREP(S.AGALACTIAE)ISOLATED (A)  Final   Report Status 09/05/2019 FINAL  Final   Organism ID, Bacteria GROUP B STREP(S.AGALACTIAE)ISOLATED  Final      Susceptibility   Group b strep(s.agalactiae)isolated - MIC*    CLINDAMYCIN >=1 RESISTANT Resistant     AMPICILLIN <=0.25 SENSITIVE Sensitive     ERYTHROMYCIN >=8 RESISTANT Resistant     VANCOMYCIN 0.5 SENSITIVE Sensitive     CEFTRIAXONE <=0.12  SENSITIVE Sensitive     LEVOFLOXACIN 1 SENSITIVE Sensitive     * GROUP B STREP(S.AGALACTIAE)ISOLATED  Culture, blood (Routine x 2)     Status: Abnormal   Collection Time: 09/02/19 10:44 AM   Specimen: BLOOD  Result Value Ref Range Status   Specimen Description   Final    BLOOD RFA Performed at [facility], [address] [place], [place] [address]    Special Requests   Final    BOTTLES DRAWN AEROBIC AND ANAEROBIC Blood Culture adequate volume Performed at [facility], [address] [place], [place] [address]    Culture  Setup Time   Final    GRAM POSITIVE COCCI AEROBIC BOTTLE ONLY CRITICAL VALUE NOTED.  VALUE IS CONSISTENT WITH PREVIOUSLY REPORTED  AND CALLED VALUE. Performed at [facility], [address] [place], [place] [address]    Culture (A)  Final    GROUP B STREP(S.AGALACTIAE)ISOLATED SUSCEPTIBILITIES PERFORMED ON PREVIOUS CULTURE WITHIN THE LAST 5 DAYS. Performed at [facility], [address] [place], [place] [address]    Report Status 09/05/2019 FINAL  Final  Blood Culture ID Panel (Reflexed)     Status: Abnormal   Collection Time: 09/02/19 10:44 AM  Result Value Ref Range Status   Enterococcus species NOT DETECTED NOT DETECTED Final   Listeria monocytogenes NOT DETECTED NOT DETECTED Final   Staphylococcus species NOT DETECTED NOT DETECTED Final   Staphylococcus aureus (BCID) NOT DETECTED NOT DETECTED Final   Streptococcus species DETECTED (A) NOT DETECTED Final    Comment: CRITICAL RESULT CALLED TO, READ BACK BY AND VERIFIED WITH: SCOTT HALL ON 09/03/19 AT 0437 [place]    Streptococcus agalactiae DETECTED (A) NOT DETECTED Final    Comment: CRITICAL RESULT CALLED TO, READ BACK BY AND VERIFIED WITH: SCOTT HALL ON 09/03/19 AT 0437 [place]    Streptococcus pneumoniae NOT DETECTED NOT DETECTED Final   Streptococcus pyogenes NOT DETECTED NOT DETECTED Final   Acinetobacter baumannii NOT DETECTED NOT DETECTED Final    Enterobacteriaceae species NOT DETECTED NOT DETECTED Final   Enterobacter cloacae complex NOT DETECTED NOT DETECTED Final   Escherichia coli NOT DETECTED NOT DETECTED Final   Klebsiella oxytoca NOT DETECTED NOT DETECTED Final   Klebsiella pneumoniae NOT DETECTED NOT DETECTED Final   Proteus species NOT DETECTED NOT DETECTED Final   Serratia marcescens NOT DETECTED NOT DETECTED Final   Haemophilus influenzae NOT DETECTED NOT DETECTED Final   Neisseria meningitidis NOT DETECTED NOT DETECTED Final   Pseudomonas aeruginosa NOT DETECTED NOT DETECTED Final   Candida albicans NOT DETECTED NOT DETECTED Final   Candida glabrata NOT DETECTED NOT DETECTED Final   Candida krusei NOT DETECTED NOT DETECTED Final   Candida parapsilosis NOT DETECTED NOT DETECTED Final   Candida tropicalis NOT DETECTED NOT DETECTED Final    Comment: Performed at [facility], [address] [place], [place] [address]  SARS Coronavirus 2 by RT PCR (hospital order, performed in [place] hospital lab) Nasopharyngeal Nasopharyngeal Swab     Status: None   Collection Time: 09/02/19  3:28 PM   Specimen: Nasopharyngeal Swab  Result Value Ref Range Status   SARS Coronavirus 2 NEGATIVE NEGATIVE Final    Comment: (NOTE) SARS-CoV-2 target nucleic acids are NOT DETECTED.  The SARS-CoV-2 RNA is generally detectable in upper and lower respiratory specimens during the acute phase of infection. The lowest concentration of SARS-CoV-2 viral copies this assay can detect is 250 copies / mL. A negative result does not preclude SARS-CoV-2 infection and should not be used as the sole basis for treatment or other patient management decisions.  A negative result may occur with improper specimen collection / handling, submission of specimen other than nasopharyngeal swab, presence of viral mutation(s) within the areas targeted by this assay, and inadequate number of viral copies (<250 copies / mL). A negative result must be  combined with clinical observations, patient history, and epidemiological information.  Fact Sheet for Patients:   StrictlyIdeas.no  Fact Sheet for Healthcare Providers: BankingDealers.co.za  This test is not yet approved or  cleared by the [place] FDA and has been authorized for detection and/or diagnosis of SARS-CoV-2 by FDA under an Emergency Use Authorization (EUA).  This EUA will remain in effect (meaning this test can  be used) for the duration of the COVID-19 declaration under Section 564(b)(1) of the Act, 21 U.S.C. section 360bbb-3(b)(1), unless the authorization is terminated or revoked sooner.  Performed at [facility], [address] [place], [place] [address]   Urine Culture     Status: Abnormal   Collection Time: 09/03/19  1:07 PM   Specimen: Urine, Random  Result Value Ref Range Status   Specimen Description   Final    URINE, RANDOM Performed at [facility], [address] [place], [place] [address]    Special Requests   Final    NONE Performed at [facility], [address] [place], [place] [address]    Culture MULTIPLE SPECIES PRESENT, SUGGEST RECOLLECTION (A)  Final   Report Status 09/04/2019 FINAL  Final  CULTURE, BLOOD (ROUTINE X 2) w Reflex to ID Panel     Status: None (Preliminary result)   Collection Time: 09/04/19  9:13 AM   Specimen: BLOOD  Result Value Ref Range Status   Specimen Description BLOOD RIGHT ANTECUBITAL  Final   Special Requests   Final    BOTTLES DRAWN AEROBIC AND ANAEROBIC Blood Culture adequate volume   Culture   Final    NO GROWTH < 24 HOURS Performed at [facility], [address] [place], [place] [address]    Report Status PENDING  Incomplete  CULTURE, BLOOD (ROUTINE X 2) w Reflex to ID Panel     Status: None (Preliminary result)   Collection Time: 09/04/19  9:39 AM   Specimen: BLOOD  Result Value Ref Range Status   Specimen  Description BLOOD LEFT WRIST  Final   Special Requests   Final    BOTTLES DRAWN AEROBIC AND ANAEROBIC Blood Culture results may not be optimal due to an inadequate volume of blood received in culture bottles   Culture   Final    NO GROWTH < 24 HOURS Performed at [facility], [address], [place], [place] [address]    Report Status PENDING  Incomplete      Radiology Studies: No results found.      Scheduled Meds: . amLODipine  5 mg Oral Daily  . buPROPion  150 mg Oral Daily  . dabigatran  150 mg Oral Q12H  . DULoxetine  60 mg Oral Daily  . estradiol  1 Applicatorful Vaginal Q M,W,F  . fesoterodine  8 mg Oral Daily  . losartan  50 mg Oral Daily   And  . hydrochlorothiazide  12.5 mg Oral Daily  . insulin aspart  0-20 Units Subcutaneous TID WC  . ipratropium-albuterol  3 mL Nebulization BID  . levothyroxine  175 mcg Oral QAC breakfast  . loratadine  10 mg Oral Daily  . metoprolol succinate  50 mg Oral Daily  . mometasone-formoterol  2 puff Inhalation BID  . montelukast  10 mg Oral QHS  . nystatin   Topical BID  . pantoprazole  40 mg Oral Daily  . pregabalin  50 mg Oral TID  . sodium chloride flush  3 mL Intravenous Q12H  . [START ON 09/08/2019] Vitamin D (Ergocalciferol)  50,000 Units Oral Weekly   Continuous Infusions: . sodium chloride    . cefTRIAXone (ROCEPHIN)  IV 2 g (09/04/19 1600)     Assessment/Plan:  RML pneumonia with Sepsis and acute hypoxic respiratory failure: present on admission,withT-max of 100.42F,  leukocytosis12.2K,lobar pneumonia (right middle lobe),acute hypoxic respiratory failure and bacteremia.Patient was placed on oxygen supplementation with improvement now with antibiotics,  on room air. White count has normalized.   Bacteremia: Blood culture from 6/12 grew 3 out of 4 bottles gram-positive cocci-streptococci agalactiae-sensitivities pending,2D echocardiogram did not show any vegetations.Continue Rocephin.  DCed Zithromax,  repeat blood culture sent 6/14-no growth so far. ESR 60, CRP 8.8. Unclear source of bacteremia- ? Skin (more common)  versus lung vs bone.  Consulted ID -will f/u recommendations regarding length of abx treatment and regarding need for TEE. Explained to patient and family  Chronic left heelwound, right heel /ankle pain with erythema-she is followed closely byDr Baker->consulted-> recommended x rays which show severe left first metatarsophalangeal arthrosis and moderate bilateral midfoot arthrosis.  Seen by podiatry and recommended local dressings for what appears to be a recurrent heel wound due to calcaneal gait-recommended local dressing and offloading boots.  To follow-up podiatric clinic upon discharge.  Troponemia: could be secondary to demand ischemia from sepsis.Cardiology input appreciated. Echo 6/13-nl EF-D/Ced heparin drip, likely demand ischemia as myoview -ve in 2020.  ?Toxic encephalopathy:Husband apparently expressed concerns about patient having intermittent hallucination for a year or so. She had a CT of the head in May 2020 and that was normal,husband is wondering Xanax can cause intermittent hallucination,he is concerned that patient is on Xanax with unsteady feet,plan to taper Xanaxand follow up with pcp-reduced to 0.25 mg twice daily as needed.  She appears to be awake alert oriented x3 during rounds.  Noninsulin-dependent type 2 diabetes,with hyperglycemia,A1c 6.8. Home medication Metformin on hold due to sepsis,on SSI here  Pyuria: urine culture with multiple species, repeat sample suggested.  On antibiotics for pneumonia  CKD 3 a,anemia of chronic disease:Creatinine appears at baseline, in fact improved.  Renal dosing meds  Chronic A. Fib,rate controlled, continue Toprol-XL and Pradaxa  Hypertension: On Toprol-XL,losartan HCTZ  Hypothyroidism:Continue Synthroid  Class 3 obesity:Body mass index is 42.15 kg/m.    DVT prophylaxis: Pradaxa Code  Status:  Full code Family / Patient Communication:  Discussed with patient, husband and daughter at bedside.  Disposition Plan:   Status is: Inpatient  Remains inpatient appropriate because:IV treatments appropriate due to intensity of illness or inability to take PO, on IV antibiotics and repeat blood cultures pending.   Dispo: The patient is from: Home  Anticipated d/c is to: May need short-term SNF rehab, awaiting formal PT evaluation.  Anticipated d/c date is: 2-3 days with possible prolonged IV abx course  Patient currently is not medically stable to d/c.    LOS: 3 days    Time spent: 35 minutes    Guilford Shi, MD [facility] Pager in [place]  If 7PM-7AM, please contact night-coverage [email or web address] 09/05/2019, 3:26 PM

## 2019-09-05 NOTE — Plan of Care (Signed)

## 2019-09-06 ENCOUNTER — Telehealth: Payer: Self-pay | Admitting: Internal Medicine

## 2019-09-06 LAB — GLUCOSE, CAPILLARY
Glucose-Capillary: 108 mg/dL — ABNORMAL HIGH (ref 70–99)
Glucose-Capillary: 158 mg/dL — ABNORMAL HIGH (ref 70–99)
Glucose-Capillary: 115 mg/dL — ABNORMAL HIGH (ref 70–99)
Glucose-Capillary: 137 mg/dL — ABNORMAL HIGH (ref 70–99)

## 2019-09-06 LAB — PROCALCITONIN: Procalcitonin: 0.13 ng/mL

## 2019-09-06 MED ORDER — ALPRAZOLAM 0.25 MG PO TABS
0.2500 mg | ORAL_TABLET | Freq: Two times a day (BID) | ORAL | Status: DC | PRN
  Administered 2019-09-06 – 2019-09-08 (×3): 0.25 mg via ORAL
  Filled 2019-09-06 (×3): qty 1

## 2019-09-06 MED ORDER — DULOXETINE HCL 20 MG PO CPEP
40.0000 mg | ORAL_CAPSULE | Freq: Every day | ORAL | Status: DC
  Administered 2019-09-07 – 2019-09-08 (×2): 40 mg via ORAL
  Filled 2019-09-06 (×2): qty 2

## 2019-09-06 NOTE — Telephone Encounter (Signed)
For your information  

## 2019-09-06 NOTE — Telephone Encounter (Signed)
Yes ive seen the chart  sorry for this inform husband

## 2019-09-06 NOTE — Telephone Encounter (Signed)
Pt husband called to let us know that his wife is in hospital she been in [facility] Saturday 6-12 still in the hospital

## 2019-09-06 NOTE — Evaluation (Signed)
Physical Therapy Evaluation Patient Details Name: [name] MRN: [id number] DOB: January 18, 1951 Today's Date: 09/06/2019   History of Present Illness  presented to ER secondary to progressive weakness, SOB and low-grade fever; admitted for management of acute respiratory failure due to PNA.  Hospital couse also significant for L heel debridement (chronic issue), recommended for post-op shoe with mobiltiy tasks.  Clinical Impression  Patient alert and oriented; follows commands, and agreeable to participation with session this date.  Husband at bedside, helping patient prepare for visit with family this evening. Patient denies pain and reports improvement in breathing since admission.  Demonstrates bilat UE/LE strength and ROM grossly symmetrical and WFL; except bilat PF contractures evident (L < R).  Chronic L heel wound noted; recommended for use of post-op shoe with all [place] activities.  Able to complete bed mobility with mod indep; sit/stand, basic transfers and gait (30') with RW, cga/min assist. Demonstrates partially reciprocal stepping pattern; slow and cautious, but no overt buckling or LOB.  Good tolerance of L LE post-op shoe.  Does require consistent use of RW for all gait efforts. Would benefit from skilled PT to address above deficits and promote optimal return to PLOF.;d Recommend transition to HHPT upon discharge from acute hospitalization.      Follow Up Recommendations Home health PT    Equipment Recommendations       Recommendations for Other Services       Precautions / Restrictions Precautions Precautions: Fall Precaution Comments: L post-op shoe with WBing      Mobility  Bed Mobility Overal bed mobility: Modified Independent                Transfers Overall transfer level: Needs assistance Equipment used: Rolling walker (2 wheeled) Transfers: Sit to/from Stand           General transfer comment: cuing for hand placement, requires UE support to  complete  Ambulation/Gait Ambulation/Gait assistance: Min guard Gait Distance (Feet): 30 Feet Assistive device: Rolling walker (2 wheeled)       General Gait Details: partially reciprocal stepping pattern; slow and cautious, but no overt buckling or LOB.  Good tolerance of L LE post-op shoe.  Stairs            Wheelchair Mobility    Modified Rankin (Stroke Patients Only)       Balance Overall balance assessment: Needs assistance Sitting-balance support: No upper extremity supported;Feet supported Sitting balance-Leahy Scale: Good       Standing balance-Leahy Scale: Fair                               Pertinent Vitals/Pain Pain Assessment: No/denies pain    Home Living Family/patient expects to be discharged to:: Private residence Living Arrangements: Spouse/significant other Available Help at Discharge: Family;Available 24 hours/day Type of Home: House       Home Layout: One level Home Equipment: [place] - 2 wheels;Walker - 4 wheels;Bedside commode      Prior Function Level of Independence: Needs assistance         Comments: Sup with household mobilization with [phone]; husband assists with household responsibilities and errands as needed.  Does endorse multiple fall history.     Hand Dominance        Extremity/Trunk Assessment   Upper Extremity Assessment Upper Extremity Assessment: Overall WFL for tasks assessed    Lower Extremity Assessment Lower Extremity Assessment: Overall WFL for tasks assessed (grossly at  least 4-/5 throughout; chronic PF contractures (L > R) bilat LEs)       Communication   Communication: No difficulties  Cognition Arousal/Alertness: Awake/alert Behavior During Therapy: WFL for tasks assessed/performed Overall Cognitive Status: Within Functional Limits for tasks assessed                                        General Comments      Exercises     Assessment/Plan    PT Assessment  Patient needs continued PT services  PT Problem List Decreased strength;Decreased range of motion;Decreased activity tolerance;Decreased balance;Decreased mobility;Decreased knowledge of use of DME;Decreased safety awareness;Decreased knowledge of precautions       PT Treatment Interventions DME instruction;Gait training;Stair training;Functional mobility training;Therapeutic activities;Therapeutic exercise;Balance training;Patient/family education    PT Goals (Current goals can be found in the Care Plan section)  Acute Rehab PT Goals Patient Stated Goal: to try to walk to the door and back PT Goal Formulation: With patient/family Time For Goal Achievement: 09/20/19 Potential to Achieve Goals: Good    Frequency Min 2X/week   Barriers to discharge        Co-evaluation               AM-PAC PT "6 Clicks" Mobility  Outcome Measure Help needed turning from your back to your side while in a flat bed without using bedrails?: None Help needed moving from lying on your back to sitting on the side of a flat bed without using bedrails?: None Help needed moving to and from a bed to a chair (including a wheelchair)?: A Little Help needed standing up from a chair using your arms (e.g., wheelchair or bedside chair)?: A Little Help needed to walk in hospital room?: A Little Help needed climbing 3-5 steps with a railing? : A Little 6 Click Score: 20    End of Session Equipment Utilized During Treatment: Gait belt Activity Tolerance: Patient tolerated treatment well Patient left: in chair;with call bell/phone within reach;with chair alarm set Nurse Communication: Mobility status PT Visit Diagnosis: Difficulty in walking, not elsewhere classified (R26.2);Muscle weakness (generalized) (M62.81)    Time: 0923-3007 PT Time Calculation (min) (ACUTE ONLY): 21 min   Charges:   PT Evaluation $PT Eval Moderate Complexity: 1 Mod         Muskaan Smet H. Owens Shark, PT, DPT, NCS 09/06/19, 10:31  PM [phone]

## 2019-09-06 NOTE — Progress Notes (Addendum)
PROGRESS NOTE    [name]  QQP:[id number]  DOB: 1950/12/31  PCP: [name], [name], MD Admit date:09/02/2019 69 y/o female with h/oanxiety, depression, diabetes mellitus, COPD, hypertension, morbid obesity who presents to the ED for evaluation of productive cough, chills, generalized weakness and shortness of breathfor 2 days.Patient had mild dysuria, was seen by urology as an outpatient and was told her urine was sterile. On the morning of her admission she was very weak and unable to get up out of bed. Her husband called EMS due to worsening weakness Per EMS patient had a temp of 100.8 when they arrived and was noted to be hypoxic-low 80s on RA. Patient was placed on oxygen supplementation with improvement in her pulse oximetry. She received Tylenol 975 mg x 1 dose as well as albuterol treatment enroute. ED Course: WBC 12.2with a left shift as well as pyuria. Trop 120,Chest x-ray shows no acute findings. EKG-minimal ST depression in the inferior leads.COVID screen -ve. Hospital course: Patient admitted to [facility] with heparin drip and IV abx.   Subjective: Patient appears to be somnolent when seen in rounds this morning.  Easily arousable.  Answers a few questions but went back to sleep.  Seen by ID yesterday. Objective: Vitals:   09/06/19 0748 09/06/19 0841 09/06/19 1214 09/06/19 1703  BP: (!) 154/72  133/62 (!) 157/79  Pulse: (!) 58 71 62 66  Resp: '17  16 18  ' Temp: 97.6 F (36.4 C)  97.9 F (36.6 C) 97.8 F (36.6 C)  TempSrc:      SpO2: 100%  99% 97%  Weight:      Height:        Intake/Output Summary (Last 24 hours) at 09/06/2019 1711 Last data filed at 09/06/2019 1552 Gross per 24 hour  Intake 3 ml  Output 3050 ml  Net -3047 ml   Filed Weights   09/03/19 0440 09/04/19 0600 09/06/19 0332  Weight: 114.9 kg 115.3 kg 111.6 kg    Physical Examination:  General exam: Appears in no acute distress Respiratory system: Clear to auscultation. Respiratory effort  normal. Cardiovascular system: S1 & S2 heard, RRR. No JVD, murmurs, rubs, gallops or clicks. No pedal edema. Gastrointestinal system: Abdomen is nondistended, soft and nontender. Normal bowel sounds heard. Central nervous system: Alert and oriented. No new focal neurological deficits. Extremities/skin: Left heel superficial ulceration with no signs of active drainage/infection, old injury/chronic hyperpigmentation along left shin , rt medial malleolar superficial pressure wound as below, right thigh cellulitis. Psychiatry: Judgement and insight appear normal. Mood & affect appropriate.         Data Reviewed: I have personally reviewed following labs and imaging studies  CBC: Recent Labs  Lab 09/02/19 1044 09/03/19 0238 09/04/19 0531  WBC 12.2* 7.8 7.8  NEUTROABS 10.9*  --  6.5  HGB 10.2* 9.7* 9.1*  HCT 32.6* 31.2* 28.1*  MCV 84.2 85.2 82.4  PLT 251 196 712   Basic Metabolic Panel: Recent Labs  Lab 09/02/19 1044 09/03/19 0238 09/04/19 0531  NA 136 135 141  K 4.0 3.8 4.2  CL 100 100 104  CO2 '25 26 29  ' GLUCOSE 237* 275* 159*  BUN 21 25* 30*  CREATININE 1.26* 1.25* 1.19*  CALCIUM 8.5* 8.3* 8.6*   GFR: Estimated Creatinine Clearance (by C-G formula based on SCr of 1.19 mg/dL (H)) Female: 55.5 mL/min (A) Female: 67.5 mL/min (A) Liver Function Tests: Recent Labs  Lab 09/02/19 1044  AST 13*  ALT 9  ALKPHOS 74  BILITOT  0.7  PROT 6.8  ALBUMIN 3.4*   No results for input(s): LIPASE, AMYLASE in the last 168 hours. No results for input(s): AMMONIA in the last 168 hours. Coagulation Profile: Recent Labs  Lab 09/02/19 1235  INR 1.5*   Cardiac Enzymes: No results for input(s): CKTOTAL, CKMB, CKMBINDEX, TROPONINI in the last 168 hours. BNP (last 3 results) No results for input(s): PROBNP in the last 8760 hours. HbA1C: No results for input(s): HGBA1C in the last 72 hours. CBG: Recent Labs  Lab 09/05/19 1620 09/05/19 2057 09/06/19 0747 09/06/19 1213  09/06/19 1655  GLUCAP 146* 152* 108* 158* 115*   Lipid Profile: No results for input(s): CHOL, HDL, LDLCALC, TRIG, CHOLHDL, LDLDIRECT in the last 72 hours. Thyroid Function Tests: No results for input(s): TSH, T4TOTAL, FREET4, T3FREE, THYROIDAB in the last 72 hours. Anemia Panel: No results for input(s): VITAMINB12, FOLATE, FERRITIN, TIBC, IRON, RETICCTPCT in the last 72 hours. Sepsis Labs: Recent Labs  Lab 09/02/19 1044 09/06/19 0931  PROCALCITON  --  0.13  LATICACIDVEN 1.2  --     Recent Results (from the past 240 hour(s))  Microscopic Examination     Status: Abnormal   Collection Time: 09/01/19  2:20 PM   Urine  Result Value Ref Range Status   WBC, UA >30 (H) 0 - 5 /hpf Final   RBC None seen 0 - 2 /hpf Final   Epithelial Cells (non renal) 0-10 0 - 10 /hpf Final   Bacteria, UA Many (A) None seen/Few Final  Culture, blood (Routine x 2)     Status: Abnormal   Collection Time: 09/02/19 10:44 AM   Specimen: BLOOD  Result Value Ref Range Status   Specimen Description   Final    BLOOD R UPPER ARM Performed at [facility], [address] [place], [place] [address]    Special Requests   Final    BOTTLES DRAWN AEROBIC AND ANAEROBIC Blood Culture adequate volume Performed at [facility], [address] [place], [place] [address]    Culture  Setup Time   Final    Organism ID to follow IN BOTH AEROBIC AND ANAEROBIC BOTTLES GRAM POSITIVE COCCI CRITICAL RESULT CALLED TO, READ BACK BY AND VERIFIED WITH: SCOTT HALL ON 09/03/19 AT [facility] Performed at [facility], [address] [place], [place] [address]    Culture GROUP B STREP(S.AGALACTIAE)ISOLATED (A)  Final   Report Status 09/05/2019 FINAL  Final   Organism ID, Bacteria GROUP B STREP(S.AGALACTIAE)ISOLATED  Final      Susceptibility   Group b strep(s.agalactiae)isolated - MIC*    CLINDAMYCIN >=1 RESISTANT Resistant     AMPICILLIN <=0.25 SENSITIVE Sensitive     ERYTHROMYCIN >=8 RESISTANT  Resistant     VANCOMYCIN 0.5 SENSITIVE Sensitive     CEFTRIAXONE <=0.12 SENSITIVE Sensitive     LEVOFLOXACIN 1 SENSITIVE Sensitive     * GROUP B STREP(S.AGALACTIAE)ISOLATED  Culture, blood (Routine x 2)     Status: Abnormal   Collection Time: 09/02/19 10:44 AM   Specimen: BLOOD  Result Value Ref Range Status   Specimen Description   Final    BLOOD RFA Performed at [facility], [address] [place], [place] [address]    Special Requests   Final    BOTTLES DRAWN AEROBIC AND ANAEROBIC Blood Culture adequate volume Performed at [facility], [address] [place], [place] [address]    Culture  Setup Time   Final    GRAM POSITIVE COCCI AEROBIC  BOTTLE ONLY CRITICAL VALUE NOTED.  VALUE IS CONSISTENT WITH PREVIOUSLY REPORTED AND CALLED VALUE. Performed at [facility], [address] [place], [place] [address]    Culture (A)  Final    GROUP B STREP(S.AGALACTIAE)ISOLATED SUSCEPTIBILITIES PERFORMED ON PREVIOUS CULTURE WITHIN THE LAST 5 DAYS. Performed at [facility], [place] [address] [place], [place] [address]    Report Status 09/05/2019 FINAL  Final  Blood Culture ID Panel (Reflexed)     Status: Abnormal   Collection Time: 09/02/19 10:44 AM  Result Value Ref Range Status   Enterococcus species NOT DETECTED NOT DETECTED Final   Listeria monocytogenes NOT DETECTED NOT DETECTED Final   Staphylococcus species NOT DETECTED NOT DETECTED Final   Staphylococcus aureus (BCID) NOT DETECTED NOT DETECTED Final   Streptococcus species DETECTED (A) NOT DETECTED Final    Comment: CRITICAL RESULT CALLED TO, READ BACK BY AND VERIFIED WITH: SCOTT HALL ON 09/03/19 AT 0437 [place]    Streptococcus agalactiae DETECTED (A) NOT DETECTED Final    Comment: CRITICAL RESULT CALLED TO, READ BACK BY AND VERIFIED WITH: SCOTT HALL ON 09/03/19 AT 0437 [place]    Streptococcus pneumoniae NOT DETECTED NOT DETECTED Final   Streptococcus pyogenes NOT DETECTED NOT DETECTED Final    Acinetobacter baumannii NOT DETECTED NOT DETECTED Final   Enterobacteriaceae species NOT DETECTED NOT DETECTED Final   Enterobacter cloacae complex NOT DETECTED NOT DETECTED Final   Escherichia coli NOT DETECTED NOT DETECTED Final   Klebsiella oxytoca NOT DETECTED NOT DETECTED Final   Klebsiella pneumoniae NOT DETECTED NOT DETECTED Final   Proteus species NOT DETECTED NOT DETECTED Final   Serratia marcescens NOT DETECTED NOT DETECTED Final   Haemophilus influenzae NOT DETECTED NOT DETECTED Final   Neisseria meningitidis NOT DETECTED NOT DETECTED Final   Pseudomonas aeruginosa NOT DETECTED NOT DETECTED Final   Candida albicans NOT DETECTED NOT DETECTED Final   Candida glabrata NOT DETECTED NOT DETECTED Final   Candida krusei NOT DETECTED NOT DETECTED Final   Candida parapsilosis NOT DETECTED NOT DETECTED Final   Candida tropicalis NOT DETECTED NOT DETECTED Final    Comment: Performed at [facility], [address] [place], [place] [address]  SARS Coronavirus 2 by RT PCR (hospital order, performed in [place] hospital lab) Nasopharyngeal Nasopharyngeal Swab     Status: None   Collection Time: 09/02/19  3:28 PM   Specimen: Nasopharyngeal Swab  Result Value Ref Range Status   SARS Coronavirus 2 NEGATIVE NEGATIVE Final    Comment: (NOTE) SARS-CoV-2 target nucleic acids are NOT DETECTED.  The SARS-CoV-2 RNA is generally detectable in upper and lower respiratory specimens during the acute phase of infection. The lowest concentration of SARS-CoV-2 viral copies this assay can detect is 250 copies / mL. A negative result does not preclude SARS-CoV-2 infection and should not be used as the sole basis for treatment or other patient management decisions.  A negative result may occur with improper specimen collection / handling, submission of specimen other than nasopharyngeal swab, presence of viral mutation(s) within the areas targeted by this assay, and inadequate number of viral  copies (<250 copies / mL). A negative result must be combined with clinical observations, patient history, and epidemiological information.  Fact Sheet for Patients:   StrictlyIdeas.no  Fact Sheet for Healthcare Providers: BankingDealers.co.za  This test is not yet approved or  cleared by the [place] FDA and has been authorized for detection and/or diagnosis of SARS-CoV-2 by FDA under an Emergency Use Authorization (  EUA).  This EUA will remain in effect (meaning this test can be used) for the duration of the COVID-19 declaration under Section 564(b)(1) of the Act, 21 U.S.C. section 360bbb-3(b)(1), unless the authorization is terminated or revoked sooner.  Performed at [facility], [address] [place], [place] [address]   Urine Culture     Status: Abnormal   Collection Time: 09/03/19  1:07 PM   Specimen: Urine, Random  Result Value Ref Range Status   Specimen Description   Final    URINE, RANDOM Performed at [facility], [address] [place], [place] [address]    Special Requests   Final    NONE Performed at [facility], [address] [place], [place] [address]    Culture MULTIPLE SPECIES PRESENT, SUGGEST RECOLLECTION (A)  Final   Report Status 09/04/2019 FINAL  Final  CULTURE, BLOOD (ROUTINE X 2) w Reflex to ID Panel     Status: None (Preliminary result)   Collection Time: 09/04/19  9:13 AM   Specimen: BLOOD  Result Value Ref Range Status   Specimen Description BLOOD RIGHT ANTECUBITAL  Final   Special Requests   Final    BOTTLES DRAWN AEROBIC AND ANAEROBIC Blood Culture adequate volume   Culture   Final    NO GROWTH 2 DAYS Performed at [facility], [address], [place], [place] [address]    Report Status PENDING  Incomplete  CULTURE, BLOOD (ROUTINE X 2) w Reflex to ID Panel     Status: None (Preliminary result)   Collection Time: 09/04/19  9:39 AM   Specimen:  BLOOD  Result Value Ref Range Status   Specimen Description BLOOD LEFT WRIST  Final   Special Requests   Final    BOTTLES DRAWN AEROBIC AND ANAEROBIC Blood Culture results may not be optimal due to an inadequate volume of blood received in culture bottles   Culture   Final    NO GROWTH 2 DAYS Performed at [facility], [address], [place], [place] [address]    Report Status PENDING  Incomplete      Radiology Studies: No results found.      Scheduled Meds:  amLODipine  5 mg Oral Daily   buPROPion  150 mg Oral Daily   dabigatran  150 mg Oral Q12H   DULoxetine  60 mg Oral Daily   estradiol  1 Applicatorful Vaginal Q M,W,F   fesoterodine  8 mg Oral Daily   losartan  50 mg Oral Daily   And   hydrochlorothiazide  12.5 mg Oral Daily   insulin aspart  0-20 Units Subcutaneous TID WC   ipratropium-albuterol  3 mL Nebulization BID   levothyroxine  175 mcg Oral QAC breakfast   loratadine  10 mg Oral Daily   metoprolol succinate  50 mg Oral Daily   mometasone-formoterol  2 puff Inhalation BID   montelukast  10 mg Oral QHS   nystatin   Topical BID   pantoprazole  40 mg Oral Daily   pregabalin  50 mg Oral TID   sodium chloride flush  3 mL Intravenous Q12H   [START ON 09/08/2019] Vitamin D (Ergocalciferol)  50,000 Units Oral Weekly   Continuous Infusions:  sodium chloride     cefTRIAXone (ROCEPHIN)  IV 2 g (09/05/19 1821)     Assessment/Plan:  RML pneumonia with Sepsis and acute hypoxic respiratory failure: present on admission,withT-max of 100.27F,  leukocytosis12.2K,lobar pneumonia (right middle lobe),acute hypoxic respiratory failure and bacteremia.Patient  was placed on oxygen supplementation with improvement now with antibiotics, on room air. White count has normalized.   Bacteremia: Blood culture from 6/12 grew 3 out of 4 bottles gram-positive cocci-streptococci agalactiae-sensitivities pending,2D echocardiogram did not show any  vegetations.Continue Rocephin.  DCed Zithromax, repeat blood culture sent 6/14-no growth so far. ESR 60, CRP 8.8.  Seen by ID who feels source of infection likely skin/lower extremity cellulitis and wounds.  They have recommended 10 days of IV antibiotics-will discuss with family regarding home IV therapy versus short-term SNF rehab.  Patient has not been cooperating with physical therapy evaluation in last couple of days.  Chronic left heelwound, right heel /ankle pain with erythema-she is followed closely byDr Baker->consulted-> recommended x rays which show severe left first metatarsophalangeal arthrosis and moderate bilateral midfoot arthrosis.  Seen by podiatry and recommended local dressings for what appears to be a recurrent heel wound due to calcaneal gait-recommended local dressing and offloading boots.  To follow-up podiatric clinic upon discharge.  Troponemia: could be secondary to demand ischemia from sepsis.Cardiology input appreciated. Echo 6/13-nl EF-D/Ced heparin drip, likely demand ischemia as myoview -ve in 2020.  Toxic encephalopathy:Husband apparently expressed concerns about patient having intermittent hallucination for a year or so. She had a CT of the head in May 2020 and that was normal,husband is wondering Xanax can cause intermittent hallucination,he is concerned that patient is on Xanax with unsteady feet,plan to taper Xanaxand follow up with pcp-reduced to 0.25 mg twice daily as needed.  She appeared to be awake alert oriented x3 during rounds last 2 days but somnolent this morning.  She also gets other psych medications during the daytime.  Will reduce Cymbalta from 60 mg to 40 mg daily.  She got a dose last night but none this morning.  She stated she was tired.  Will request physical therapy to evaluate in a.m.  Noninsulin-dependent type 2 diabetes,with hyperglycemia,A1c 6.8. Home medication Metformin on hold due to sepsis,on SSI here  Pyuria: urine culture with  multiple species, repeat sample suggested.  On antibiotics for pneumonia  CKD 3 a,anemia of chronic disease:Creatinine appears at baseline, in fact improved.  Renal dosing meds  Chronic A. Fib,rate controlled, continue Toprol-XL and Pradaxa  Hypertension: On Toprol-XL,losartan HCTZ  Hypothyroidism:Continue Synthroid  Class 3 obesity:Body mass index is 42.15 kg/m.    DVT prophylaxis: Pradaxa Code Status:  Full code Family / Patient Communication:  Discussed with patient, husband and daughter at bedside.  Disposition Plan:   Status is: Inpatient  Remains inpatient appropriate because:IV treatments appropriate due to intensity of illness or inability to take PO, on IV antibiotics and repeat blood cultures pending.   Dispo: The patient is from: Home  Anticipated d/c is to: May need short-term SNF rehab, awaiting formal PT evaluation, hopefully tomorrow if patient not sedated and more cooperative..  Anticipated d/c date is: 2 days with possible prolonged IV abx course  Patient currently is not medically stable to d/c.    LOS: 4 days    Time spent: 35 minutes    Guilford Shi, MD [facility] Pager in [place]  If 7PM-7AM, please contact night-coverage [email or web address] 09/06/2019, 5:11 PM

## 2019-09-07 ENCOUNTER — Other Ambulatory Visit: Payer: Self-pay | Admitting: Internal Medicine

## 2019-09-07 DIAGNOSIS — J309 Allergic rhinitis, unspecified: Secondary | ICD-10-CM

## 2019-09-07 LAB — CBC
HCT: 33 % — ABNORMAL LOW (ref 36.0–46.0)
Hemoglobin: 10.5 g/dL — ABNORMAL LOW (ref 12.0–15.0)
MCH: 26.5 pg (ref 26.0–34.0)
MCHC: 31.8 g/dL (ref 30.0–36.0)
MCV: 83.3 fL (ref 80.0–100.0)
Platelets: 273 10*3/uL (ref 150–400)
RBC: 3.96 MIL/uL (ref 3.87–5.11)
RDW: 15.6 % — ABNORMAL HIGH (ref 11.5–15.5)
WBC: 7.4 10*3/uL (ref 4.0–10.5)
nRBC: 0 % (ref 0.0–0.2)

## 2019-09-07 LAB — GLUCOSE, CAPILLARY
Glucose-Capillary: 134 mg/dL — ABNORMAL HIGH (ref 70–99)
Glucose-Capillary: 163 mg/dL — ABNORMAL HIGH (ref 70–99)
Glucose-Capillary: 156 mg/dL — ABNORMAL HIGH (ref 70–99)
Glucose-Capillary: 150 mg/dL — ABNORMAL HIGH (ref 70–99)

## 2019-09-07 MED ORDER — SODIUM CHLORIDE 0.9% FLUSH: 10.0000 mL | INTRAVENOUS | Status: DC | PRN

## 2019-09-07 MED ORDER — LEVOCETIRIZINE DIHYDROCHLORIDE 5 MG PO TABS: 2.5000 mg | ORAL_TABLET | Freq: Every evening | ORAL | 3 refills | Status: DC

## 2019-09-07 NOTE — Telephone Encounter (Signed)
Called and left message for patient's husband apologizing and informing that we are following her case.

## 2019-09-07 NOTE — Progress Notes (Signed)
PROGRESS NOTE    [name]  IRJ:[id number]  DOB: 12-14-1950  PCP: [name], [name], MD Admit date:09/02/2019 69 y/o female with h/oanxiety, depression, diabetes mellitus, COPD, hypertension, morbid obesity who presents to the ED for evaluation of productive cough, chills, generalized weakness and shortness of breathfor 2 days.Patient had mild dysuria, was seen by urology as an outpatient and was told her urine was sterile.On the morning of her admission she was very weak and unable to get up out of bed. Her husband called EMS due to worsening weakness.Per EMS patient had a temp of 100.8 when they arrived and was noted to be hypoxic-low 80s on RA. Patient was placed on oxygen supplementation with improvement in her pulse oximetry. She received Tylenol 975 mg x 1 dose as well as albuterol treatment enroute. ED Course: WBC 12.2with a left shift as well as pyuria. Trop 120,Chest x-ray shows no acute findings. EKG-minimal ST depression in the inferior leads.COVID screen -ve. Hospital course: Patient admitted to [facility] with heparin drip and IV abx.   Subjective: Patient out of bed to chair.  Awake and oriented.   Objective: Vitals:   09/07/19 0528 09/07/19 0742 09/07/19 1222 09/07/19 1622  BP: (!) 179/77 (!) 155/97 139/82 138/65  Pulse: 60 61 71 67  Resp: '17 16 16 16  ' Temp: 97.9 F (36.6 C) 97.7 F (36.5 C) 97.8 F (36.6 C) 97.7 F (36.5 C)  TempSrc:      SpO2: 98% 100% 93% 97%  Weight: 109.8 kg     Height:        Intake/Output Summary (Last 24 hours) at 09/07/2019 1646 Last data filed at 09/07/2019 1139 Gross per 24 hour  Intake 486 ml  Output 1600 ml  Net -1114 ml   Filed Weights   09/04/19 0600 09/06/19 0332 09/07/19 0528  Weight: 115.3 kg 111.6 kg 109.8 kg    Physical Examination:  General exam: Appears in no acute distress Respiratory system: Clear to auscultation. Respiratory effort normal. Cardiovascular system: S1 & S2 heard, RRR. No JVD, murmurs, rubs,  gallops or clicks. No pedal edema. Gastrointestinal system: Abdomen is nondistended, soft and nontender. Normal bowel sounds heard. Central nervous system: Alert and oriented. No new focal neurological deficits. Extremities/skin: Left heel superficial ulceration with no signs of active drainage/infection, old injury/chronic hyperpigmentation along left shin , rt medial malleolar superficial pressure wound as below, right thigh cellulitis. Psychiatry: Judgement and insight appear normal. Mood & affect appropriate.         Data Reviewed: I have personally reviewed following labs and imaging studies  CBC: Recent Labs  Lab 09/02/19 1044 09/03/19 0238 09/04/19 0531 09/07/19 0607  WBC 12.2* 7.8 7.8 7.4  NEUTROABS 10.9*  --  6.5  --   HGB 10.2* 9.7* 9.1* 10.5*  HCT 32.6* 31.2* 28.1* 33.0*  MCV 84.2 85.2 82.4 83.3  PLT 251 196 219 301   Basic Metabolic Panel: Recent Labs  Lab 09/02/19 1044 09/03/19 0238 09/04/19 0531  NA 136 135 141  K 4.0 3.8 4.2  CL 100 100 104  CO2 '25 26 29  ' GLUCOSE 237* 275* 159*  BUN 21 25* 30*  CREATININE 1.26* 1.25* 1.19*  CALCIUM 8.5* 8.3* 8.6*   GFR: Estimated Creatinine Clearance (by C-G formula based on SCr of 1.19 mg/dL (H)) Female: 54.2 mL/min (A) Female: 66 mL/min (A) Liver Function Tests: Recent Labs  Lab 09/02/19 1044  AST 13*  ALT 9  ALKPHOS 74  BILITOT 0.7  PROT 6.8  ALBUMIN  3.4*   No results for input(s): LIPASE, AMYLASE in the last 168 hours. No results for input(s): AMMONIA in the last 168 hours. Coagulation Profile: Recent Labs  Lab 09/02/19 1235  INR 1.5*   Cardiac Enzymes: No results for input(s): CKTOTAL, CKMB, CKMBINDEX, TROPONINI in the last 168 hours. BNP (last 3 results) No results for input(s): PROBNP in the last 8760 hours. HbA1C: No results for input(s): HGBA1C in the last 72 hours. CBG: Recent Labs  Lab 09/06/19 1655 09/06/19 2150 09/07/19 0740 09/07/19 1126 09/07/19 1615  GLUCAP 115* 137* 134*  163* 156*   Lipid Profile: No results for input(s): CHOL, HDL, LDLCALC, TRIG, CHOLHDL, LDLDIRECT in the last 72 hours. Thyroid Function Tests: No results for input(s): TSH, T4TOTAL, FREET4, T3FREE, THYROIDAB in the last 72 hours. Anemia Panel: No results for input(s): VITAMINB12, FOLATE, FERRITIN, TIBC, IRON, RETICCTPCT in the last 72 hours. Sepsis Labs: Recent Labs  Lab 09/02/19 1044 09/06/19 0931  PROCALCITON  --  0.13  LATICACIDVEN 1.2  --     Recent Results (from the past 240 hour(s))  Microscopic Examination     Status: Abnormal   Collection Time: 09/01/19  2:20 PM   Urine  Result Value Ref Range Status   WBC, UA >30 (H) 0 - 5 /hpf Final   RBC None seen 0 - 2 /hpf Final   Epithelial Cells (non renal) 0-10 0 - 10 /hpf Final   Bacteria, UA Many (A) None seen/Few Final  Culture, blood (Routine x 2)     Status: Abnormal   Collection Time: 09/02/19 10:44 AM   Specimen: BLOOD  Result Value Ref Range Status   Specimen Description   Final    BLOOD R UPPER ARM Performed at [facility], [address] [place], [place] [address]    Special Requests   Final    BOTTLES DRAWN AEROBIC AND ANAEROBIC Blood Culture adequate volume Performed at [facility], [address] [place], [place] [address]    Culture  Setup Time   Final    Organism ID to follow IN BOTH AEROBIC AND ANAEROBIC BOTTLES GRAM POSITIVE COCCI CRITICAL RESULT CALLED TO, READ BACK BY AND VERIFIED WITH: SCOTT HALL ON 09/03/19 AT [facility] Performed at [facility], [address] [place], [place] [address]    Culture GROUP B STREP(S.AGALACTIAE)ISOLATED (A)  Final   Report Status 09/05/2019 FINAL  Final   Organism ID, Bacteria GROUP B STREP(S.AGALACTIAE)ISOLATED  Final      Susceptibility   Group b strep(s.agalactiae)isolated - MIC*    CLINDAMYCIN >=1 RESISTANT Resistant     AMPICILLIN <=0.25 SENSITIVE Sensitive     ERYTHROMYCIN >=8 RESISTANT Resistant     VANCOMYCIN 0.5 SENSITIVE  Sensitive     CEFTRIAXONE <=0.12 SENSITIVE Sensitive     LEVOFLOXACIN 1 SENSITIVE Sensitive     * GROUP B STREP(S.AGALACTIAE)ISOLATED  Culture, blood (Routine x 2)     Status: Abnormal   Collection Time: 09/02/19 10:44 AM   Specimen: BLOOD  Result Value Ref Range Status   Specimen Description   Final    BLOOD RFA Performed at [facility], [address] [place], [place] [address]    Special Requests   Final    BOTTLES DRAWN AEROBIC AND ANAEROBIC Blood Culture adequate volume Performed at [facility], [address] [place], [place] [address]    Culture  Setup Time   Final    GRAM POSITIVE COCCI AEROBIC BOTTLE ONLY CRITICAL VALUE NOTED.  VALUE IS CONSISTENT WITH PREVIOUSLY REPORTED AND CALLED VALUE. Performed at [facility], [address] [place], [place] [address]    Culture (A)  Final    GROUP B STREP(S.AGALACTIAE)ISOLATED SUSCEPTIBILITIES PERFORMED ON PREVIOUS CULTURE WITHIN THE LAST 5 DAYS. Performed at [facility], [place] [address] [place], [place] [address]    Report Status 09/05/2019 FINAL  Final  Blood Culture ID Panel (Reflexed)     Status: Abnormal   Collection Time: 09/02/19 10:44 AM  Result Value Ref Range Status   Enterococcus species NOT DETECTED NOT DETECTED Final   Listeria monocytogenes NOT DETECTED NOT DETECTED Final   Staphylococcus species NOT DETECTED NOT DETECTED Final   Staphylococcus aureus (BCID) NOT DETECTED NOT DETECTED Final   Streptococcus species DETECTED (A) NOT DETECTED Final    Comment: CRITICAL RESULT CALLED TO, READ BACK BY AND VERIFIED WITH: SCOTT HALL ON 09/03/19 AT 0437 [place]    Streptococcus agalactiae DETECTED (A) NOT DETECTED Final    Comment: CRITICAL RESULT CALLED TO, READ BACK BY AND VERIFIED WITH: SCOTT HALL ON 09/03/19 AT 0437 [employer]    Streptococcus pneumoniae NOT DETECTED NOT DETECTED Final   Streptococcus pyogenes NOT DETECTED NOT DETECTED Final   Acinetobacter baumannii NOT DETECTED NOT  DETECTED Final   Enterobacteriaceae species NOT DETECTED NOT DETECTED Final   Enterobacter cloacae complex NOT DETECTED NOT DETECTED Final   Escherichia coli NOT DETECTED NOT DETECTED Final   Klebsiella oxytoca NOT DETECTED NOT DETECTED Final   Klebsiella pneumoniae NOT DETECTED NOT DETECTED Final   Proteus species NOT DETECTED NOT DETECTED Final   Serratia marcescens NOT DETECTED NOT DETECTED Final   Haemophilus influenzae NOT DETECTED NOT DETECTED Final   Neisseria meningitidis NOT DETECTED NOT DETECTED Final   Pseudomonas aeruginosa NOT DETECTED NOT DETECTED Final   Candida albicans NOT DETECTED NOT DETECTED Final   Candida glabrata NOT DETECTED NOT DETECTED Final   Candida krusei NOT DETECTED NOT DETECTED Final   Candida parapsilosis NOT DETECTED NOT DETECTED Final   Candida tropicalis NOT DETECTED NOT DETECTED Final    Comment: Performed at [facility], [address] [place], [place] [address]  SARS Coronavirus 2 by RT PCR (hospital order, performed in [place] hospital lab) Nasopharyngeal Nasopharyngeal Swab     Status: None   Collection Time: 09/02/19  3:28 PM   Specimen: Nasopharyngeal Swab  Result Value Ref Range Status   SARS Coronavirus 2 NEGATIVE NEGATIVE Final    Comment: (NOTE) SARS-CoV-2 target nucleic acids are NOT DETECTED.  The SARS-CoV-2 RNA is generally detectable in upper and lower respiratory specimens during the acute phase of infection. The lowest concentration of SARS-CoV-2 viral copies this assay can detect is 250 copies / mL. A negative result does not preclude SARS-CoV-2 infection and should not be used as the sole basis for treatment or other patient management decisions.  A negative result may occur with improper specimen collection / handling, submission of specimen other than nasopharyngeal swab, presence of viral mutation(s) within the areas targeted by this assay, and inadequate number of viral copies (<250 copies / mL). A negative  result must be combined with clinical observations, patient history, and epidemiological information.  Fact Sheet for Patients:   StrictlyIdeas.no  Fact Sheet for Healthcare Providers: BankingDealers.co.za  This test is not yet approved or  cleared by the [place] FDA and has been authorized for detection and/or diagnosis of SARS-CoV-2 by FDA under an Emergency Use Authorization (EUA).  This EUA will remain  in effect (meaning this test can be used) for the duration of the COVID-19 declaration under Section 564(b)(1) of the Act, 21 U.S.C. section 360bbb-3(b)(1), unless the authorization is terminated or revoked sooner.  Performed at [facility], [address], [place], [place] [address]   Urine Culture     Status: Abnormal   Collection Time: 09/03/19  1:07 PM   Specimen: Urine, Random  Result Value Ref Range Status   Specimen Description   Final    URINE, RANDOM Performed at [facility], [address] [place], [place] [address]    Special Requests   Final    NONE Performed at [facility], [address] [place], [place] [address]    Culture MULTIPLE SPECIES PRESENT, SUGGEST RECOLLECTION (A)  Final   Report Status 09/04/2019 FINAL  Final  CULTURE, BLOOD (ROUTINE X 2) w Reflex to ID Panel     Status: None (Preliminary result)   Collection Time: 09/04/19  9:13 AM   Specimen: BLOOD  Result Value Ref Range Status   Specimen Description BLOOD RIGHT ANTECUBITAL  Final   Special Requests   Final    BOTTLES DRAWN AEROBIC AND ANAEROBIC Blood Culture adequate volume   Culture   Final    NO GROWTH 3 DAYS Performed at [facility], [address], [place], [place] [address]    Report Status PENDING  Incomplete  CULTURE, BLOOD (ROUTINE X 2) w Reflex to ID Panel     Status: None (Preliminary result)   Collection Time: 09/04/19  9:39 AM   Specimen: BLOOD  Result Value Ref Range Status    Specimen Description BLOOD LEFT WRIST  Final   Special Requests   Final    BOTTLES DRAWN AEROBIC AND ANAEROBIC Blood Culture results may not be optimal due to an inadequate volume of blood received in culture bottles   Culture   Final    NO GROWTH 3 DAYS Performed at [facility], [address] [place], [place] [address]    Report Status PENDING  Incomplete      Radiology Studies: No results found.      Scheduled Meds: . amLODipine  5 mg Oral Daily  . buPROPion  150 mg Oral Daily  . dabigatran  150 mg Oral Q12H  . DULoxetine  40 mg Oral Daily  . estradiol  1 Applicatorful Vaginal Q M,W,F  . fesoterodine  8 mg Oral Daily  . losartan  50 mg Oral Daily   And  . hydrochlorothiazide  12.5 mg Oral Daily  . insulin aspart  0-20 Units Subcutaneous TID WC  . ipratropium-albuterol  3 mL Nebulization BID  . levothyroxine  175 mcg Oral QAC breakfast  . loratadine  10 mg Oral Daily  . metoprolol succinate  50 mg Oral Daily  . mometasone-formoterol  2 puff Inhalation BID  . montelukast  10 mg Oral QHS  . nystatin   Topical BID  . pantoprazole  40 mg Oral Daily  . pregabalin  50 mg Oral TID  . sodium chloride flush  3 mL Intravenous Q12H  . [START ON 09/08/2019] Vitamin D (Ergocalciferol)  50,000 Units Oral Weekly   Continuous Infusions: . sodium chloride    . cefTRIAXone (ROCEPHIN)  IV 2 g (09/06/19 1725)     Assessment/Plan:  RML pneumonia with Sepsis and acute hypoxic respiratory failure: present on admission,withT-max of 100.79F,  leukocytosis12.2K,lobar pneumonia (right middle lobe),acute hypoxic respiratory failure and bacteremia.Patient was placed on oxygen supplementation with  improvement now with antibiotics, on room air. White count has normalized.  Bacteremia: Blood culture from 6/12 grew 3 out of 4 bottles gram-positive cocci-streptococci agalactiae-sensitivities pending,2D echocardiogram did not show any vegetations.Continue Rocephin.  DCed Zithromax,  repeat blood culture sent 6/14-no growth so far. ESR 60, CRP 8.8.  Seen by ID who feels source of infection likely skin/lower extremity cellulitis and wounds.  They have recommended 10 days of IV antibiotics-today is Day 5.  Plan for Home IV abx and d/c home tomorrow with a midline once [facility] services set up.    Chronic left heelwound, right heel /ankle pain with erythema-she is followed closely byDr Baker->consulted-> recommended x rays which show severe left first metatarsophalangeal arthrosis and moderate bilateral midfoot arthrosis.  Seen by podiatry and recommended local dressings for what appears to be a recurrent heel wound due to calcaneal gait-recommended local dressing and offloading boots.  To follow-up podiatric clinic upon discharge.  Troponemia: could be secondary to demand ischemia from sepsis.Cardiology input appreciated. Echo 6/13-nl EF-D/Ced heparin drip, likely demand ischemia as myoview -ve in 2020.  Toxic encephalopathy:Husband apparently expressed concerns about patient having intermittent hallucination for a year or so. She had a CT of the head in May 2020 and that was normal,husband is wondering Xanax can cause intermittent hallucination,he is concerned that patient is on Xanax with unsteady feet,plan to taper Xanaxand follow up with pcp-reduced to 0.25 mg twice daily as needed.  She appeared to be awake alert oriented x3 during rounds last 2 days but somnolent this morning.  She also gets other psych medications during the daytime. Reduce Cymbalta from 60 mg to 40 mg daily.    Noninsulin-dependent type 2 diabetes,with hyperglycemia,A1c 6.8. Home medication Metformin on hold due to sepsis,on SSI here  Pyuria: urine culture with multiple species, repeat sample suggested.  On antibiotics for pneumonia  CKD 3 a,anemia of chronic disease:Creatinine appears at baseline, in fact improved.  Renal dosing meds  Chronic A. Fib,rate controlled, continue Toprol-XL and  Pradaxa  Hypertension: On Toprol-XL,losartan HCTZ  Hypothyroidism:Continue Synthroid  Class 3 obesity:Body mass index is 42.15 kg/m.    DVT prophylaxis: Pradaxa Code Status:  Full code Family / Patient Communication:  Discussed with patient, husband and daughter at bedside.  Disposition Plan:   Status is: Inpatient  Remains inpatient appropriate because:IV treatments appropriate due to intensity of illness or inability to take PO, on IV antibiotics and repeat blood cultures pending.   Dispo: The patient is from: Home  Anticipated d/c is to: May need short-term SNF rehab, awaiting formal PT evaluation, hopefully tomorrow if patient not sedated and more cooperative..  Anticipated d/c date is: 6/18.    Patient currently  medically stable to d/c.    LOS: 5 days    Time spent: 35 minutes    Guilford Shi, MD [facility] Pager in [place]  If 7PM-7AM, please contact night-coverage [email or web address] 09/07/2019, 4:46 PM

## 2019-09-07 NOTE — Telephone Encounter (Signed)
Left message to return call. Are they needing anything or have any questions for Dr Olivia Mackie?

## 2019-09-07 NOTE — NC FL2 (Signed)
[place] LEVEL OF CARE SCREENING TOOL     IDENTIFICATION  Patient Name: [name] Birthdate: 06-24-1950 Sex: adult Admission Date (Current Location): 09/02/2019  [facility] and [place] Number:  Engineering geologist and Address:  [facility], [address], [place], [place] [address]      Provider Number: [id number]  Attending Physician Name and Address:  Guilford Shi, MD  Relative Name and Phone Number:  doneen ollinger [phone]    Current Level of Care: Hospital Recommended Level of Care: [place] Prior Approval Number:    Date Approved/Denied:   PASRR Number:    Discharge Plan: SNF    Current Diagnoses: Patient Active Problem List   Diagnosis Date Noted  . Pressure injury of skin 09/04/2019  . Elevated troponin 09/02/2019  . COPD with acute exacerbation ([place]) 09/02/2019  . Acute on chronic respiratory failure with hypoxia ([place]) 09/02/2019  . Pharmacologic therapy 08/30/2019  . Recurrent falls 07/10/2019  . Left ankle pain 06/02/2019  . Chronic pain of right ankle 02/14/2019  . Arthritis 02/14/2019  . Nausea and vomiting 02/14/2019  . Meralgia paresthetica of right side 12/05/2018  . Chronic anticoagulation (Pradaxa) 12/05/2018  . Peripheral vascular disease ([place]) 10/18/2018  . Chronic ulcer of right leg ([place]) 10/18/2018  . Fibromyalgia 08/17/2018  . Chronic musculoskeletal pain 08/17/2018  . Tremor of right hand 08/03/2018  . Overactive bladder 08/03/2018  . Physical deconditioning 08/03/2018  . Acute lower UTI 07/29/2018  . Foot ulcer, left ([place]) 03/09/2018  . Adrenal mass ([place]) 08/02/2017  . Eczema 06/27/2017  . Diabetic ulcer of left foot ([place]) 06/24/2017  . Mass of both adrenal glands ([place]) 05/16/2017  . Fatty liver 05/13/2017  . Strain of right hip 04/30/2017  . Ischial pain, right 04/30/2017  . Pain of left hip joint 04/22/2017  . Abnormality of gait and mobility 04/22/2017  .  Muscle strain of left hip 04/09/2017  . Anemia 11/18/2016  . Asthma 11/18/2016  . Carpal tunnel syndrome 11/18/2016  . Pain in joint involving ankle and foot 11/18/2016  . Obstructive sleep apnea of adult 11/18/2016  . Spinal stenosis of thoracic region 11/18/2016  . Abscess of tendon of left foot 09/21/2016  . Cubital tunnel syndrome on right 08/07/2016  . Chronic pain syndrome 02/25/2016  . Long term current use of opiate analgesic 02/25/2016  . Chronic right shoulder pain 02/25/2016  . CAP (community acquired pneumonia) 10/11/2015  . Status post reverse total shoulder replacement 10/08/2015  . 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 Morbid obesity with BMI of 40.0-44.9, adult ([place]) 09/23/2015  . Chronic prescription benzodiazepine use 09/23/2015  . Arthralgia 09/23/2015  . Osteoarthritis involving multiple joints 09/23/2015  . Other shoulder lesions (Right) 08/28/2015  . Cervical radiculitis 08/06/2015  . Fall 05/22/2015  . Opiate use (15 MME/Day) 01/16/2015  . Impingement syndrome of shoulder region 01/07/2015  . Mixed incontinence 11/26/2014  . Atrophic vaginitis 11/26/2014  . Anxiety and depression 09/21/2014  . Bladder cystocele 09/21/2014  . Gastro-esophageal reflux disease without esophagitis 09/21/2014  . DM (diabetes mellitus) type II, controlled, with peripheral vascular disorder ([place]) 08/31/2014  . Diabetic peripheral neuropathy associated with type 2 diabetes mellitus ([place]) 08/31/2014  . Asthma, well controlled 08/31/2014  . Hypothyroidism, unspecified 08/31/2014  . Peripheral vascular disease due to secondary diabetes mellitus ([place]) 12/11/2013  . Sleep apnea 10/19/2013  . Paroxysmal atrial fibrillation ([place]); CHA2DS2-Vasc Score 5. On Pradaxa 07/27/2013  . Essential hypertension  07/27/2013  . Chronic atrial fibrillation 08/10/2012    Orientation RESPIRATION BLADDER Height & Weight     Self, Time, Situation, Place  Normal Continent  Weight: 109.8 kg Height:  5\' 5"  (165.1 cm)  BEHAVIORAL SYMPTOMS/MOOD NEUROLOGICAL BOWEL NUTRITION STATUS      Continent Diet  AMBULATORY STATUS COMMUNICATION OF NEEDS Skin   Limited Assist Verbally Other (Comment) (leg wounds and rash)                       Personal Care Assistance Level of Assistance  Dressing, Bathing Bathing Assistance: Independent   Dressing Assistance: Limited assistance     Functional Limitations Info             SPECIAL CARE FACTORS FREQUENCY  PT (By licensed PT), OT (By licensed OT)     PT Frequency: 5x a week OT Frequency: 5x a week            Contractures Contractures Info: Present    Additional Factors Info  Code Status, Allergies Code Status Info: full Allergies Info: morphine, penicililn, ambien, aspirin, oxytetracycline           Current Medications (09/07/2019):  This is the current hospital active medication list Current Facility-Administered Medications  Medication Dose Route Frequency Provider Last Rate Last Admin  . 0.9 %  sodium chloride infusion  250 mL Intravenous PRN Agbata, Tochukwu, MD      . acetaminophen (TYLENOL) tablet 650 mg  650 mg Oral Q6H PRN Oswald Hillock, RPH       Or  . acetaminophen (TYLENOL) suppository 650 mg  650 mg Rectal Q6H PRN Oswald Hillock, RPH      . albuterol (PROVENTIL) (2.5 MG/3ML) 0.083% nebulizer solution 2.5 mg  2.5 mg Nebulization Q6H PRN Agbata, Tochukwu, MD   2.5 mg at 09/06/19 0553  . ALPRAZolam Duanne Moron) tablet 0.25 mg  0.25 mg Oral BID PRN Guilford Shi, MD   0.25 mg at 09/06/19 2217  . amLODipine (NORVASC) tablet 5 mg  5 mg Oral Daily Marrianne Mood D, PA-C   5 mg at 09/06/19 0841  . buPROPion (WELLBUTRIN XL) 24 hr tablet 150 mg  150 mg Oral Daily Guilford Shi, MD   150 mg at 09/06/19 0842  . cefTRIAXone (ROCEPHIN) 2 g in sodium chloride 0.9 % 100 mL IVPB  2 g Intravenous Q24H Oswald Hillock, RPH 200 mL/hr at 09/06/19 1725 2 g at 09/06/19 1725  . colchicine tablet 0.6  mg  0.6 mg Oral Daily PRN Agbata, Tochukwu, MD      . dabigatran (PRADAXA) capsule 150 mg  150 mg Oral Q12H Kate Sable, MD   150 mg at 09/06/19 2217  . DULoxetine (CYMBALTA) DR capsule 40 mg  40 mg Oral Daily Kamineni, Lamount Cranker, MD      . estradiol (ESTRACE) vaginal cream 1 Applicatorful  1 Applicatorful Vaginal Q M,W,F Agbata, Tochukwu, MD   1 Applicatorful at 16/10/96 0847  . fesoterodine (TOVIAZ) tablet 8 mg  8 mg Oral Daily Agbata, Tochukwu, MD   8 mg at 09/06/19 0842  . losartan (COZAAR) tablet 50 mg  50 mg Oral Daily Rito Ehrlich A, RPH   50 mg at 09/07/19 1231   And  . hydrochlorothiazide (MICROZIDE) capsule 12.5 mg  12.5 mg Oral Daily Rito Ehrlich A, RPH   12.5 mg at 09/07/19 1230  . HYDROcodone-acetaminophen (NORCO/VICODIN) 5-325 MG per tablet 1 tablet  1 tablet Oral Q8H PRN Collier Bullock, MD      .  insulin aspart (novoLOG) injection 0-20 Units  0-20 Units Subcutaneous TID WC Agbata, Tochukwu, MD   4 Units at 09/07/19 1256  . ipratropium-albuterol (DUONEB) 0.5-2.5 (3) MG/3ML nebulizer solution 3 mL  3 mL Nebulization BID Florencia Reasons, MD   3 mL at 09/07/19 0808  . levothyroxine (SYNTHROID) tablet 175 mcg  175 mcg Oral QAC breakfast Agbata, Tochukwu, MD   175 mcg at 09/07/19 0547  . loratadine (CLARITIN) tablet 10 mg  10 mg Oral Daily Agbata, Tochukwu, MD   10 mg at 09/07/19 1230  . metoprolol succinate (TOPROL-XL) 24 hr tablet 50 mg  50 mg Oral Daily Agbata, Tochukwu, MD   50 mg at 09/07/19 1229  . mometasone-formoterol (DULERA) 200-5 MCG/ACT inhaler 2 puff  2 puff Inhalation BID Agbata, Tochukwu, MD   2 puff at 09/06/19 2100  . montelukast (SINGULAIR) tablet 10 mg  10 mg Oral QHS Agbata, Tochukwu, MD   10 mg at 09/06/19 2217  . nystatin (MYCOSTATIN/NYSTOP) topical powder   Topical BID Athena Masse, MD   Given at 09/06/19 2217  . ondansetron (ZOFRAN) tablet 4 mg  4 mg Oral Q6H PRN Agbata, Tochukwu, MD       Or  . ondansetron (ZOFRAN) injection 4 mg  4 mg Intravenous Q6H PRN  Agbata, Tochukwu, MD   4 mg at 09/07/19 1259  . pantoprazole (PROTONIX) EC tablet 40 mg  40 mg Oral Daily Agbata, Tochukwu, MD   40 mg at 09/07/19 1231  . pregabalin (LYRICA) capsule 50 mg  50 mg Oral TID Agbata, Tochukwu, MD   50 mg at 09/07/19 1230  . sodium chloride flush (NS) 0.9 % injection 3 mL  3 mL Intravenous Q12H Agbata, Tochukwu, MD   3 mL at 09/06/19 2218  . sodium chloride flush (NS) 0.9 % injection 3 mL  3 mL Intravenous PRN Agbata, Tochukwu, MD      . Derrill Memo ON 09/08/2019] Vitamin D (Ergocalciferol) (DRISDOL) capsule 50,000 Units  50,000 Units Oral Weekly Agbata, Tochukwu, MD         Discharge Medications: Please see discharge summary for a list of discharge medications.  Relevant Imaging Results:  Relevant Lab Results:   Additional Information ss: [id number], IV antibiotix x 5 days  Victorino Dike, RN

## 2019-09-07 NOTE — NC FL2 (Deleted)
[place] LEVEL OF CARE SCREENING TOOL     IDENTIFICATION  Patient Name: [name] Birthdate: 1950/06/20 Sex: adult Admission Date (Current Location): 09/02/2019  [facility] and [place] Number:  Engineering geologist and Address:  [facility], [address], [place], [place] [address]      Provider Number: [id number]  Attending Physician Name and Address:  Guilford Shi, MD  Relative Name and Phone Number:  janisha bueso [phone]    Current Level of Care: Hospital Recommended Level of Care: [place] Prior Approval Number:    Date Approved/Denied:   PASRR Number:    Discharge Plan: SNF    Current Diagnoses: Patient Active Problem List   Diagnosis Date Noted  . Pressure injury of skin 09/04/2019  . Elevated troponin 09/02/2019  . COPD with acute exacerbation ([place]) 09/02/2019  . Acute on chronic respiratory failure with hypoxia ([place]) 09/02/2019  . Pharmacologic therapy 08/30/2019  . Recurrent falls 07/10/2019  . Left ankle pain 06/02/2019  . Chronic pain of right ankle 02/14/2019  . Arthritis 02/14/2019  . Nausea and vomiting 02/14/2019  . Meralgia paresthetica of right side 12/05/2018  . Chronic anticoagulation (Pradaxa) 12/05/2018  . Peripheral vascular disease ([place]) 10/18/2018  . Chronic ulcer of right leg ([place]) 10/18/2018  . Fibromyalgia 08/17/2018  . Chronic musculoskeletal pain 08/17/2018  . Tremor of right hand 08/03/2018  . Overactive bladder 08/03/2018  . Physical deconditioning 08/03/2018  . Acute lower UTI 07/29/2018  . Foot ulcer, left ([place]) 03/09/2018  . Adrenal mass ([place]) 08/02/2017  . Eczema 06/27/2017  . Diabetic ulcer of left foot ([place]) 06/24/2017  . Mass of both adrenal glands ([place]) 05/16/2017  . Fatty liver 05/13/2017  . Strain of right hip 04/30/2017  . Ischial pain, right 04/30/2017  . Pain of left hip joint 04/22/2017  . Abnormality of gait and mobility 04/22/2017  .  Muscle strain of left hip 04/09/2017  . Anemia 11/18/2016  . Asthma 11/18/2016  . Carpal tunnel syndrome 11/18/2016  . Pain in joint involving ankle and foot 11/18/2016  . Obstructive sleep apnea of adult 11/18/2016  . Spinal stenosis of thoracic region 11/18/2016  . Abscess of tendon of left foot 09/21/2016  . Cubital tunnel syndrome on right 08/07/2016  . Chronic pain syndrome 02/25/2016  . Long term current use of opiate analgesic 02/25/2016  . Chronic right shoulder pain 02/25/2016  . CAP (community acquired pneumonia) 10/11/2015  . Status post reverse total shoulder replacement 10/08/2015  . 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 Morbid obesity with BMI of 40.0-44.9, adult ([place]) 09/23/2015  . Chronic prescription benzodiazepine use 09/23/2015  . Arthralgia 09/23/2015  . Osteoarthritis involving multiple joints 09/23/2015  . Other shoulder lesions (Right) 08/28/2015  . Cervical radiculitis 08/06/2015  . Fall 05/22/2015  . Opiate use (15 MME/Day) 01/16/2015  . Impingement syndrome of shoulder region 01/07/2015  . Mixed incontinence 11/26/2014  . Atrophic vaginitis 11/26/2014  . Anxiety and depression 09/21/2014  . Bladder cystocele 09/21/2014  . Gastro-esophageal reflux disease without esophagitis 09/21/2014  . DM (diabetes mellitus) type II, controlled, with peripheral vascular disorder ([place]) 08/31/2014  . Diabetic peripheral neuropathy associated with type 2 diabetes mellitus ([place]) 08/31/2014  . Asthma, well controlled 08/31/2014  . Hypothyroidism, unspecified 08/31/2014  . Peripheral vascular disease due to secondary diabetes mellitus ([place]) 12/11/2013  . Sleep apnea 10/19/2013  . Paroxysmal atrial fibrillation ([place]); CHA2DS2-Vasc Score 5. On Pradaxa 07/27/2013  . Essential hypertension  07/27/2013  . Chronic atrial fibrillation 08/10/2012    Orientation RESPIRATION BLADDER Height & Weight     Self, Time, Situation, Place  Normal Continent  Weight: 109.8 kg Height:  5\' 5"  (165.1 cm)  BEHAVIORAL SYMPTOMS/MOOD NEUROLOGICAL BOWEL NUTRITION STATUS      Continent Diet  AMBULATORY STATUS COMMUNICATION OF NEEDS Skin   Limited Assist Verbally Other (Comment) (leg wounds and rash)                       Personal Care Assistance Level of Assistance  Dressing, Bathing Bathing Assistance: Independent   Dressing Assistance: Limited assistance     Functional Limitations Info             SPECIAL CARE FACTORS FREQUENCY  PT (By licensed PT), OT (By licensed OT)     PT Frequency: 5x a week OT Frequency: 5x a week            Contractures Contractures Info: Present    Additional Factors Info  Code Status, Allergies Code Status Info: full Allergies Info: morphine, penicililn, ambien, aspirin, oxytetracycline           Current Medications (09/07/2019):  This is the current hospital active medication list Current Facility-Administered Medications  Medication Dose Route Frequency Provider Last Rate Last Admin  . 0.9 %  sodium chloride infusion  250 mL Intravenous PRN Agbata, Tochukwu, MD      . acetaminophen (TYLENOL) tablet 650 mg  650 mg Oral Q6H PRN Oswald Hillock, RPH       Or  . acetaminophen (TYLENOL) suppository 650 mg  650 mg Rectal Q6H PRN Oswald Hillock, RPH      . albuterol (PROVENTIL) (2.5 MG/3ML) 0.083% nebulizer solution 2.5 mg  2.5 mg Nebulization Q6H PRN Agbata, Tochukwu, MD   2.5 mg at 09/06/19 0553  . ALPRAZolam Duanne Moron) tablet 0.25 mg  0.25 mg Oral BID PRN Guilford Shi, MD   0.25 mg at 09/06/19 2217  . amLODipine (NORVASC) tablet 5 mg  5 mg Oral Daily Marrianne Mood D, PA-C   5 mg at 09/06/19 0841  . buPROPion (WELLBUTRIN XL) 24 hr tablet 150 mg  150 mg Oral Daily Guilford Shi, MD   150 mg at 09/06/19 0842  . cefTRIAXone (ROCEPHIN) 2 g in sodium chloride 0.9 % 100 mL IVPB  2 g Intravenous Q24H Oswald Hillock, RPH 200 mL/hr at 09/06/19 1725 2 g at 09/06/19 1725  . colchicine tablet 0.6  mg  0.6 mg Oral Daily PRN Agbata, Tochukwu, MD      . dabigatran (PRADAXA) capsule 150 mg  150 mg Oral Q12H Kate Sable, MD   150 mg at 09/06/19 2217  . DULoxetine (CYMBALTA) DR capsule 40 mg  40 mg Oral Daily Kamineni, Lamount Cranker, MD      . estradiol (ESTRACE) vaginal cream 1 Applicatorful  1 Applicatorful Vaginal Q M,W,F Agbata, Tochukwu, MD   1 Applicatorful at 37/16/96 0847  . fesoterodine (TOVIAZ) tablet 8 mg  8 mg Oral Daily Agbata, Tochukwu, MD   8 mg at 09/06/19 0842  . losartan (COZAAR) tablet 50 mg  50 mg Oral Daily Rito Ehrlich A, RPH   50 mg at 09/07/19 1231   And  . hydrochlorothiazide (MICROZIDE) capsule 12.5 mg  12.5 mg Oral Daily Rito Ehrlich A, RPH   12.5 mg at 09/07/19 1230  . HYDROcodone-acetaminophen (NORCO/VICODIN) 5-325 MG per tablet 1 tablet  1 tablet Oral Q8H PRN Collier Bullock, MD      .  insulin aspart (novoLOG) injection 0-20 Units  0-20 Units Subcutaneous TID WC Agbata, Tochukwu, MD   4 Units at 09/07/19 1256  . ipratropium-albuterol (DUONEB) 0.5-2.5 (3) MG/3ML nebulizer solution 3 mL  3 mL Nebulization BID Florencia Reasons, MD   3 mL at 09/07/19 0808  . levothyroxine (SYNTHROID) tablet 175 mcg  175 mcg Oral QAC breakfast Agbata, Tochukwu, MD   175 mcg at 09/07/19 0547  . loratadine (CLARITIN) tablet 10 mg  10 mg Oral Daily Agbata, Tochukwu, MD   10 mg at 09/07/19 1230  . metoprolol succinate (TOPROL-XL) 24 hr tablet 50 mg  50 mg Oral Daily Agbata, Tochukwu, MD   50 mg at 09/07/19 1229  . mometasone-formoterol (DULERA) 200-5 MCG/ACT inhaler 2 puff  2 puff Inhalation BID Agbata, Tochukwu, MD   2 puff at 09/06/19 2100  . montelukast (SINGULAIR) tablet 10 mg  10 mg Oral QHS Agbata, Tochukwu, MD   10 mg at 09/06/19 2217  . nystatin (MYCOSTATIN/NYSTOP) topical powder   Topical BID Athena Masse, MD   Given at 09/06/19 2217  . ondansetron (ZOFRAN) tablet 4 mg  4 mg Oral Q6H PRN Agbata, Tochukwu, MD       Or  . ondansetron (ZOFRAN) injection 4 mg  4 mg Intravenous Q6H PRN  Agbata, Tochukwu, MD   4 mg at 09/07/19 1259  . pantoprazole (PROTONIX) EC tablet 40 mg  40 mg Oral Daily Agbata, Tochukwu, MD   40 mg at 09/07/19 1231  . pregabalin (LYRICA) capsule 50 mg  50 mg Oral TID Agbata, Tochukwu, MD   50 mg at 09/07/19 1230  . sodium chloride flush (NS) 0.9 % injection 3 mL  3 mL Intravenous Q12H Agbata, Tochukwu, MD   3 mL at 09/06/19 2218  . sodium chloride flush (NS) 0.9 % injection 3 mL  3 mL Intravenous PRN Agbata, Tochukwu, MD      . Derrill Memo ON 09/08/2019] Vitamin D (Ergocalciferol) (DRISDOL) capsule 50,000 Units  50,000 Units Oral Weekly Agbata, Tochukwu, MD         Discharge Medications: Please see discharge summary for a list of discharge medications.  Relevant Imaging Results:  Relevant Lab Results:   Additional Information ss: [id number]  Victorino Dike, RN

## 2019-09-07 NOTE — Progress Notes (Signed)
ID Pt doing better Pain rt leg whle flexing her ankle as the skin is very tight Pain thigh much improved  O/E awake and alert Patient Vitals for the past 24 hrs:  BP Temp Temp src Pulse Resp SpO2 Weight  09/07/19 1622 138/65 97.7 F (36.5 C) -- 67 16 97 % --  09/07/19 1222 139/82 97.8 F (36.6 C) -- 71 16 93 % --  09/07/19 0742 (!) 155/97 97.7 F (36.5 C) -- 61 16 100 % --  09/07/19 0528 (!) 179/77 97.9 F (36.6 C) -- 60 17 98 % 109.8 kg  09/06/19 2114 -- -- -- -- -- 97 % --  09/06/19 2006 -- -- -- 71 -- 97 % --  09/06/19 2005 139/69 97.6 F (36.4 C) Oral 70 18 (!) 88 % --  Pale  Chest b/l air entry [id number] Left midline  Rt leg          CBC Latest Ref Rng & Units 09/07/2019 09/04/2019 09/03/2019  WBC 4.0 - 10.5 K/uL 7.4 7.8 7.8  Hemoglobin 12.0 - 15.0 g/dL 10.5(L) 9.1(L) 9.7(L)  Hematocrit 36 - 46 % 33.0(L) 28.1(L) 31.2(L)  Platelets 150 - 400 K/uL 273 219 196    CMP Latest Ref Rng & Units 09/04/2019 09/03/2019 09/02/2019  Glucose 70 - 99 mg/dL 159(H) 275(H) 237(H)  BUN 8 - 23 mg/dL 30(H) 25(H) 21  Creatinine 0.44 - 1.00 mg/dL 1.19(H) 1.25(H) 1.26(H)  Sodium 135 - 145 mmol/L 141 135 136  Potassium 3.5 - 5.1 mmol/L 4.2 3.8 4.0  Chloride 98 - 111 mmol/L 104 100 100  CO2 22 - 32 mmol/L 29 26 25   Calcium 8.9 - 10.3 mg/dL 8.6(L) 8.3(L) 8.5(L)  Total Protein 6.5 - 8.1 g/dL - - 6.8  Total Bilirubin 0.3 - 1.2 mg/dL - - 0.7  Alkaline Phos 38 - 126 U/L - - 74  AST 15 - 41 U/L - - 13(L)  ALT 0 - 44 U/L - - 9     Impression/Recommendation ? ?Group B streptococcus bacteremia likely source is rt leg wound and a patch of cellulitis over the rt thigh.  The thigh area of erythema has improved so is the edema.  This appearance is atypical for cellulitis with the differential diagnosis being tenia or dermatitis.  But both unlikely also this does not look like erythema migrans and neither does she have any risk as she is not very ambulant.  Ceftriaxone to be given till  09/14/2019.  Resp symptoms with infiltrate in the Rt lung questioning community-acquired pneumonia and being treated with ceftriaxone and Zithromax Could be viral as her husband had bronchitis.  Pro-Cal 0.13 The ceftriaxone treats the bacteremia as well She will need ceftriaxone until 09/14/2019.Diabetes mellitus- management as per primary team  She does not have UTI- she has chronic mixed incontinence secondary to multiple factors including Genitourinary syndrome of menopause  Obesity Poor mobility  Bilateral venous edema and venous dermatitis To keep the legs elevated. May need to wear compression stockings  Left knee arthroplasty  Discussed the management with the patient.  We will see her as outpatient if needed. ID will sign off call if needed

## 2019-09-07 NOTE — Telephone Encounter (Signed)
Pt's husband returned your call. Please call on his cell

## 2019-09-07 NOTE — Care Management Important Message (Signed)
Important Message  Patient Details  Name: [name] MRN: [id number] Date of Birth: July 12, 1950   Medicare Important Message Given:  Yes  Reviewed with husband over room phone due to isolation.  Patient and spouse aware of Medicare IM.   [name] 09/07/2019, 3:01 PM

## 2019-09-07 NOTE — Progress Notes (Signed)
PHARMACY CONSULT NOTE FOR:  OUTPATIENT  PARENTERAL ANTIBIOTIC THERAPY (OPAT)  Indication: group B streptococcus bacteremia and cellulitis Regimen: ceftriaxone 2gm IV q24h End date: 09/14/2019  IV antibiotic discharge orders are pended. To discharging provider:  please sign these orders via discharge navigator,  Select New Orders & click on the button choice - Manage This Unsigned Work.     Thank you for allowing pharmacy to be a part of this patient's care.  Doreene Eland, PharmD, BCPS.   Work Cell: [phone] 09/07/2019 3:34 PM

## 2019-09-07 NOTE — Treatment Plan (Signed)
Diagnosis: Group B streptococcus bacteremia with rt leg cellulitis Baseline Creatinine   Culture Result: 1.19  Allergies  Allergen Reactions  . Morphine And Related Other (See Comments) and Nausea Only    Patient becomes very confused Other reaction(s): Other (See Comments), Other (See Comments) Patient becomes very confused Patient becomes very confused   . Penicillins Hives, Shortness Of Breath, Swelling and Other (See Comments)    Facial swelling Has patient had a PCN reaction causing immediate rash, facial/tongue/throat swelling, SOB or lightheadedness with hypotension: Yes Has patient had a PCN reaction causing severe rash involving mucus membranes or skin necrosis: Yes Has patient had a PCN reaction that required hospitalization No Has patient had a PCN reaction occurring within the last 10 years: No If all of the above answers are "NO", then may proceed with Cephalosporin use.  Other reaction(s): Other (See Comments), Other (See Comments) Facial swelling Has patient had a PCN reaction causing immediate rash, facial/tongue/throat swelling, SOB or lightheadedness with hypotension: Yes Has patient had a PCN reaction causing severe rash involving mucus membranes or skin necrosis: Yes Has patient had a PCN reaction that required hospitalization No Has patient had a PCN reaction occurring within the last 10 years: No If all of the above answers are "NO", then may proceed with Cephalosporin use. Facial swelling Has patient had a PCN reaction causing immediate rash, facial/tongue/throat swelling, SOB or lightheadedness with hypotension: Yes Has patient had a PCN reaction causing severe rash involving mucus membranes or skin necrosis: Yes Has patient had a PCN reaction that required hospitalization No Has patient had a PCN reaction occurring within the last 10 years: No If all of the above answers are "NO", then may proceed with Cephalosporin use. Facial swelling Has patient had a  PCN reaction causing immediate rash, facial/tongue/throat swelling, SOB or lightheadedness with hypotension: Yes Has patient had a PCN reaction causing severe rash involving mucus membranes or skin necrosis: Yes Has patient had a PCN reaction that required hospitalization No Has patient had a PCN reaction occurring within the last 10 years: No If all of the above answers are "NO", then may proceed with Cephalosporin use.   . Ambien [Zolpidem]     Hallucination   . Aspirin Hives  . Oxytetracycline Hives    Other reaction(s): Unknown     OPAT Orders Ceftriaxone 2 grams Iv every 24 hours until 09/14/19 PIC/MIDline Care Per Protocol:  Labs weekly while on IV antibiotics: _X_ CBC with differential  _X_ CMP   _X_ Please pull PIC at completion of IV antibiotics _ Fax weekly labs to [phone]  Clinic Follow Up Appt:not required Follow up with pdoatrist   Call [phone] if any questions

## 2019-09-08 ENCOUNTER — Ambulatory Visit: Payer: PPO

## 2019-09-08 DIAGNOSIS — R7881 Bacteremia: Secondary | ICD-10-CM

## 2019-09-08 DIAGNOSIS — L03115 Cellulitis of right lower limb: Secondary | ICD-10-CM

## 2019-09-08 DIAGNOSIS — G929 Unspecified toxic encephalopathy: Secondary | ICD-10-CM

## 2019-09-08 DIAGNOSIS — A419 Sepsis, unspecified organism: Secondary | ICD-10-CM

## 2019-09-08 DIAGNOSIS — G92 Toxic encephalopathy: Secondary | ICD-10-CM

## 2019-09-08 LAB — BASIC METABOLIC PANEL
Anion gap: 8 (ref 5–15)
BUN: 31 mg/dL — ABNORMAL HIGH (ref 8–23)
CO2: 28 mmol/L (ref 22–32)
Calcium: 8.8 mg/dL — ABNORMAL LOW (ref 8.9–10.3)
Chloride: 100 mmol/L (ref 98–111)
Creatinine, Ser: 1.13 mg/dL — ABNORMAL HIGH (ref 0.44–1.00)
GFR calc Af Amer: 57 mL/min — ABNORMAL LOW (ref >60)
GFR calc non Af Amer: 49 mL/min — ABNORMAL LOW (ref >60)
Glucose, Bld: 191 mg/dL — ABNORMAL HIGH (ref 70–99)
Potassium: 5 mmol/L (ref 3.5–5.1)
Sodium: 136 mmol/L (ref 135–145)

## 2019-09-08 LAB — GLUCOSE, CAPILLARY
Glucose-Capillary: 142 mg/dL — ABNORMAL HIGH (ref 70–99)
Glucose-Capillary: 135 mg/dL — ABNORMAL HIGH (ref 70–99)
Glucose-Capillary: 142 mg/dL — ABNORMAL HIGH (ref 70–99)

## 2019-09-08 MED ORDER — FUROSEMIDE 10 MG/ML IJ SOLN: 20.0000 mg | Freq: Once | INTRAMUSCULAR | Status: DC

## 2019-09-08 MED ORDER — VITAMIN D (ERGOCALCIFEROL) 1.25 MG (50000 UNIT) PO CAPS: 50000.0000 [IU] | ORAL_CAPSULE | ORAL | Status: DC

## 2019-09-08 MED ORDER — ALPRAZOLAM 0.5 MG PO TABS: 0.2500 mg | ORAL_TABLET | Freq: Two times a day (BID) | ORAL | 2 refills | Status: DC | PRN

## 2019-09-08 MED ORDER — HYDRALAZINE HCL 25 MG PO TABS
25.0000 mg | ORAL_TABLET | Freq: Two times a day (BID) | ORAL | 11 refills | Status: DC
Start: 2019-09-08 — End: 2019-11-22

## 2019-09-08 MED ORDER — DULOXETINE HCL 40 MG PO CPEP: 40.0000 mg | ORAL_CAPSULE | Freq: Every day | ORAL | 0 refills | Status: DC

## 2019-09-08 MED ORDER — CEFTRIAXONE IV (FOR PTA / DISCHARGE USE ONLY): 2.0000 g | INTRAVENOUS | 0 refills | Status: AC

## 2019-09-08 MED ORDER — ACETAMINOPHEN 325 MG PO TABS: 650.0000 mg | ORAL_TABLET | Freq: Four times a day (QID) | ORAL | Status: DC | PRN

## 2019-09-08 NOTE — Progress Notes (Deleted)
Physician Discharge Summary  [name] TOI:[id number] DOB: 03/12/1951 DOA: 09/02/2019  PCP: [name], [name], MD  Admit date: 09/02/2019 Discharge date: 09/08/2019 Consultations: Infectious diseases Admitted From: home Disposition: Home  Discharge Diagnoses:  Principal Problem:   Sepsis ([place]) Active Problems:   Acute respiratory failure with hypoxia ([place])   Bacteremia due to Gram-positive bacteria   Toxic encephalopathy   CAP (community acquired pneumonia)   Cellulitis   Anxiety and depression   Paroxysmal atrial fibrillation ([place]); CHA2DS2-Vasc Score 5. On Pradaxa   Essential hypertension   Hypothyroidism, unspecified   Morbid obesity with BMI of 40.0-44.9, adult (HCC)   Physical deconditioning   Elevated troponin   Pressure injury of skin   Hospital Course Summary: Admit date:09/02/2019 69 y/o female with h/oanxiety, depression, diabetes mellitus, COPD, hypertension, morbid obesity who presents to theEDfor evaluation ofproductive cough, chills,generalized weakness and shortness of breathfor 2 days.Patient had mild dysuria,was seen by urology as an outpatient and was told her urine was sterile.On the morning of her admission she was very weak and unable to get up out of bed. Her husband called EMS due to worsening weakness.Per EMS patient had a temp of 100.8 when they arrived and was noted to be hypoxic-low 80son RA. Patient was placed on oxygen supplementation with improvement in her pulse oximetry.She received Tylenol 975 mg x 1 dose as well as albuterol treatmentenroute. ED Course:WBC12.2with a left shift as well as pyuria.Trop 120,Chest x-ray shows no acute findings.EKG-minimal ST depression in the inferior leads.COVID screen -ve. Hospital course: Patient admitted to [facility] heparin drip and IV abx.  RML pneumonia withSepsisand acute hypoxic respiratory failure:present on admission,withT-max of 100.21F,leukocytosis12.2K,lobar pneumonia (right  middle lobe),acute hypoxic respiratory failure andbacteremia. Patient received Rocephin and azithromycin during the hospital course.Patient was placed on oxygen supplementation with improvement now  on room air. White count has normalized.  Cellulitis, Bacteremia: Blood culture from 6/12 grew 3 out of 4 bottles gram-positive cocci-streptococci agalactiae-sensitivities pending,2D echocardiogram did not show any vegetations.Continue Rocephin, repeat blood culture sent 6/14-no growth so far. ESR 60, CRP 8.8.  Seen by ID, Dr Delaine Lame, who feels source of infection likely skin/lower extremity cellulitis and wounds.  They have recommended 10 days of IV antibiotics-stop date 09/14/19.  Plan was for Home IV abx and once [facility] services set up-unfortunately patient does not have insurance approval and will have to stay as inpatient for completion of IV abx or come to infusion clinic for daily antibiotics. She has a midline which was placed on 6/17. After much discussion with health care team (Infectious diseases, Hospitalist team, pharmacy, SW, PT), patient wants to go home and husband willing to drive her to infusion clinic.Bedside nurse has educated family on midline care and clinical pharmacy has set up outpatient infusion center visits until 6/24 for daily antibiotics. Midline can be removed after completion of antibiotics.     Chronic left heelwound, right heel /ankle pain with erythema-sheisfollowedclosely byDr Baker->consulted->recommendedx rays which show severe left first metatarsophalangeal arthrosisand moderate bilateral midfoot arthrosis.  Seen by podiatry and recommended local dressings for what appears to be a recurrent heel wound due to calcaneal gait-recommended local dressing and offloading boots.  To follow-up podiatry clinic upon discharge.  Troponemia:could be secondary to demand ischemia from sepsis. Seen by Cardiology-their input appreciated. Echo 6/13-nl EF-D/Ced heparin drip,  likely demand ischemia as myoview -ve in 2020.  Toxic encephalopathy:Husband apparently expressed concerns about patient having intermittent hallucination for a year or so. She had a CT of the head in  May 2020 and that was normal,husband is wondering Xanax can cause intermittent hallucination,he is concerned that patient is on Xanax with unsteady feet,plan to taper Xanaxand follow up with pcp-reduced to 0.25 mg twice daily as needed.  Reduced Cymbalta from 60 mg to 40 mg daily. She appeared to be awake alert oriented x3 during rounds last 2 days. Patient willing to continue this regimen until outpatient f/u with PCP.   Noninsulin-dependent type 2 diabetes,with hyperglycemia,A1c 6.8.Home medication Metforminon hold due to sepsis,on SSI here  Pyuria: urine culture with multiple species, repeat sample suggested.  On antibiotics for pneumonia  CKD 3 a,anemia of chronic disease:Creatinine appears at baseline, in fact improved.  Renal dosing meds  Chronic A. Fib,rate controlled, continueToprol-XL and Pradaxa  Hypertension:On Toprol-XL,losartan HCTZ. Been on Norvasc here as well but given concern for chronic leg edema, changed to hydralazine on discharge.   Hypothyroidism:Continue Synthroid  Class 3 obesity:Body mass index is 42.15 kg/m2. Impacting overall health, ambulation, fall risk. Advised weight loss. Follow up PCP regarding further recommendations  Discharge Exam:   Vitals:   09/08/19 0736 09/08/19 0740 09/08/19 1142 09/08/19 1520  BP: (!) 152/62  111/66 128/81  Pulse: 63  65 (!) 106  Resp: _0 Temp: 98.1 F (36.7 C)  98.1 F (36.7 C) 97.9 F (36.6 C)  TempSrc: Oral  Oral Oral  SpO2: 98% 93% 93% 96%  Weight:      Height:        General: Pt is alert, awake, not in acute distress Cardiovascular: RRR, S1/S2 +, no rubs, no gallops Respiratory: CTA bilaterally, no wheezing, no rhonchi Abdominal:Obese, soft,NT, ND, bowel sounds + Extremities: Midline in  Left arm, cellulitis in rt thigh improving, chronic stasis dermatitis and old wounds R>L Offloading boot and local dressing on left heel  Discharge Condition:Stable CODE STATUS: Full code Diet recommendation: Recommendations for Outpatient Follow-up:  1. Follow up with PCP: 3-5 days 2. Follow up with consultants: Podiatry in 1-2 weeks, Infusion center daily until 6/24, cardiology as needed 3. Please obtain follow up labs including: CBC/BMP of f/u visit  Home Health services upon discharge: referred to [facility] PT/RN but not approved by insurance Equipment/Devices upon discharge:walker at home, off loading boot, dressing supplies for midline in LUE.    Discharge Instructions:  Discharge Instructions    Call MD for:  extreme fatigue   Complete by: As directed    Call MD for:  persistant nausea and vomiting   Complete by: As directed    Call MD for:  redness, tenderness, or signs of infection (pain, swelling, redness, odor or green/yellow discharge around incision site)   Complete by: As directed    Call MD for:  severe uncontrolled pain   Complete by: As directed    Call MD for:  temperature >100.4   Complete by: As directed    Change dressing (specify)   Complete by: As directed    Dressing change: Change dressing daily to every other day to the left heel.  Leave accommodative felt padding on.  Apply mupirocin cream to the wound.  Apply 4 x 4 gauze.  ABD, Kerlix, Ace wrap.   Change dressing on IV access line weekly and PRN   Complete by: As directed    Diet - low sodium heart healthy   Complete by: As directed    Increase activity slowly   Complete by: As directed      Allergies as of 09/08/2019      Reactions  Morphine And Related Other (See Comments), Nausea Only   Patient becomes very confused Other reaction(s): Other (See Comments), Other (See Comments) Patient becomes very confused Patient becomes very confused   Penicillins Hives, Shortness Of Breath, Swelling, Other (See  Comments)   Facial swelling Has patient had a PCN reaction causing immediate rash, facial/tongue/throat swelling, SOB or lightheadedness with hypotension: Yes Has patient had a PCN reaction causing severe rash involving mucus membranes or skin necrosis: Yes Has patient had a PCN reaction that required hospitalization No Has patient had a PCN reaction occurring within the last 10 years: No If all of the above answers are "NO", then may proceed with Cephalosporin use. Other reaction(s): Other (See Comments), Other (See Comments) Facial swelling Has patient had a PCN reaction causing immediate rash, facial/tongue/throat swelling, SOB or lightheadedness with hypotension: Yes Has patient had a PCN reaction causing severe rash involving mucus membranes or skin necrosis: Yes Has patient had a PCN reaction that required hospitalization No Has patient had a PCN reaction occurring within the last 10 years: No If all of the above answers are "NO", then may proceed with Cephalosporin use. Facial swelling Has patient had a PCN reaction causing immediate rash, facial/tongue/throat swelling, SOB or lightheadedness with hypotension: Yes Has patient had a PCN reaction causing severe rash involving mucus membranes or skin necrosis: Yes Has patient had a PCN reaction that required hospitalization No Has patient had a PCN reaction occurring within the last 10 years: No If all of the above answers are "NO", then may proceed with Cephalosporin use. Facial swelling Has patient had a PCN reaction causing immediate rash, facial/tongue/throat swelling, SOB or lightheadedness with hypotension: Yes Has patient had a PCN reaction causing severe rash involving mucus membranes or skin necrosis: Yes Has patient had a PCN reaction that required hospitalization No Has patient had a PCN reaction occurring within the last 10 years: No If all of the above answers are "NO", then may proceed with Cephalosporin use.   Ambien  [zolpidem]    Hallucination    Aspirin Hives   Oxytetracycline Hives   Other reaction(s): Unknown      Medication List    STOP taking these medications   Dexilant 60 MG capsule Generic drug: dexlansoprazole   meloxicam 15 MG tablet Commonly known as: MOBIC   sucralfate 1 g tablet Commonly known as: CARAFATE   sucralfate 1 GM/10ML suspension Commonly known as: CARAFATE     TAKE these medications   acetaminophen 325 MG tablet Commonly known as: TYLENOL Take 2 tablets (650 mg total) by mouth every 6 (six) hours as needed for mild pain or moderate pain (or Fever >/= 101).   Advair HFA 115-21 MCG/ACT inhaler Generic drug: fluticasone-salmeterol Inhale 2 puffs into the lungs daily. Rinse mouth thoroughly after use   albuterol (2.5 MG/3ML) 0.083% nebulizer solution Commonly known as: PROVENTIL Take 3 mLs (2.5 mg total) by nebulization every 6 (six) hours as needed for wheezing or shortness of breath. What changed: Another medication with the same name was changed. Make sure you understand how and when to take each.   albuterol 108 (90 Base) MCG/ACT inhaler Commonly known as: VENTOLIN HFA Inhale 1-2 puffs into the lungs every 4 (four) hours as needed for wheezing or shortness of breath. What changed: how much to take   ALPRAZolam 0.5 MG tablet Commonly known as: XANAX Take 0.5 tablets (0.25 mg total) by mouth 2 (two) times daily as needed for anxiety. What changed: how much to take  buPROPion 150 MG 24 hr tablet Commonly known as: WELLBUTRIN XL Take 1 tablet (150 mg total) by mouth daily.   cefTRIAXone  IVPB Commonly known as: ROCEPHIN Inject 2 g into the vein daily for 6 days. Indication:  Group B streptococcus bacteremia, right leg cellulitis First Dose: Yes Last Day of Therapy:  09/14/2019 Labs - Once weekly:  CBC/D and CMP Method of administration: IV Push Method of administration may be changed at the discretion of home infusion pharmacist based upon assessment  of the patient and/or caregiver's ability to self-administer the medication ordered.   colchicine 0.6 MG tablet Take by mouth.   DULoxetine HCl 40 MG Cpep Take 40 mg by mouth daily. What changed:   medication strength  how much to take   estradiol 0.1 MG/GM vaginal cream Commonly known as: ESTRACE Place 1 Applicatorful vaginally every Monday, Wednesday, and Friday. Apply 0.22m (pea-sized amount)  just inside the vaginal introitus with a finger-tip on Monday, Wednesday and Friday nights,   hydrALAZINE 25 MG tablet Commonly known as: APRESOLINE Take 1 tablet (25 mg total) by mouth 2 (two) times daily.   HYDROcodone-acetaminophen 5-325 MG tablet Commonly known as: NORCO/VICODIN Take 1 tablet by mouth every 8 (eight) hours as needed for severe pain. Must last 30 days What changed: Another medication with the same name was removed. Continue taking this medication, and follow the directions you see here.   levocetirizine 5 MG tablet Commonly known as: XYZAL Take 0.5 tablets (2.5 mg total) by mouth every evening. What changed: how much to take   levothyroxine 175 MCG tablet Commonly known as: SYNTHROID Take 1 tablet (175 mcg total) by mouth daily before breakfast.   losartan-hydrochlorothiazide 50-12.5 MG tablet Commonly known as: HYZAAR Take 1 tablet by mouth daily.   metFORMIN 1000 MG tablet Commonly known as: GLUCOPHAGE Take 1 tablet (1,000 mg total) by mouth 2 (two) times daily. With food   metoprolol succinate 50 MG 24 hr tablet Commonly known as: TOPROL-XL TAKE 1 TABLET BY MOUTH DAILY   montelukast 10 MG tablet Commonly known as: SINGULAIR Take 1 tablet (10 mg total) by mouth at bedtime.   mupirocin ointment 2 % Commonly known as: Bactroban Apply 1 application topically 2 (two) times daily. Skin wounds   nystatin cream Commonly known as: MYCOSTATIN Apply 1 application topically 2 (two) times daily. Mouth prn for 7 days as needed What changed:   when to take  this  reasons to take this   ondansetron 4 MG tablet Commonly known as: Zofran Take 1 tablet (4 mg total) by mouth every 8 (eight) hours as needed for nausea or vomiting.   pantoprazole 40 MG tablet Commonly known as: PROTONIX Take by mouth.   Pradaxa 150 MG Caps capsule Generic drug: dabigatran TAKE 1 CAPSULE TWICE DAILY   pregabalin 50 MG capsule Commonly known as: LYRICA Take 1 capsule (50 mg total) by mouth 3 (three) times daily.   Toviaz 8 MG Tb24 tablet Generic drug: fesoterodine TAKE ONE TABLET BY MOUTH EVERY DAY   trospium 20 MG tablet Commonly known as: SANCTURA Take 1 tablet (20 mg total) by mouth 2 (two) times daily.   Vitamin D (Ergocalciferol) 1.25 MG (50000 UNIT) Caps capsule Commonly known as: DRISDOL Take 1 capsule (50,000 Units total) by mouth once a week. Start taking on: September 15, 2019 What changed:   medication strength  See the new instructions.   zinc oxide 20 % ointment Apply 1 application topically as needed for irritation.  Discharge Care Instructions  (From admission, onward)         Start     Ordered   09/08/19 0000  Change dressing on IV access line weekly and PRN  (Home infusion instructions - [facility] )        09/08/19 1815   09/08/19 0000  Change dressing (specify)       Comments: Dressing change: Change dressing daily to every other day to the left heel.  Leave accommodative felt padding on.  Apply mupirocin cream to the wound.  Apply 4 x 4 gauze.  ABD, Kerlix, Ace wrap.   09/08/19 1815          Follow-up Information    Troxler, Rodman Key, DPM. Schedule an appointment as soon as possible for a visit in 1 week(s).   Specialty: Podiatry Contact information: [place] [address] [phone]              Allergies  Allergen Reactions  . Morphine And Related Other (See Comments) and Nausea Only    Patient becomes very confused Other  reaction(s): Other (See Comments), Other (See Comments) Patient becomes very confused Patient becomes very confused   . Penicillins Hives, Shortness Of Breath, Swelling and Other (See Comments)    Facial swelling Has patient had a PCN reaction causing immediate rash, facial/tongue/throat swelling, SOB or lightheadedness with hypotension: Yes Has patient had a PCN reaction causing severe rash involving mucus membranes or skin necrosis: Yes Has patient had a PCN reaction that required hospitalization No Has patient had a PCN reaction occurring within the last 10 years: No If all of the above answers are "NO", then may proceed with Cephalosporin use.  Other reaction(s): Other (See Comments), Other (See Comments) Facial swelling Has patient had a PCN reaction causing immediate rash, facial/tongue/throat swelling, SOB or lightheadedness with hypotension: Yes Has patient had a PCN reaction causing severe rash involving mucus membranes or skin necrosis: Yes Has patient had a PCN reaction that required hospitalization No Has patient had a PCN reaction occurring within the last 10 years: No If all of the above answers are "NO", then may proceed with Cephalosporin use. Facial swelling Has patient had a PCN reaction causing immediate rash, facial/tongue/throat swelling, SOB or lightheadedness with hypotension: Yes Has patient had a PCN reaction causing severe rash involving mucus membranes or skin necrosis: Yes Has patient had a PCN reaction that required hospitalization No Has patient had a PCN reaction occurring within the last 10 years: No If all of the above answers are "NO", then may proceed with Cephalosporin use. Facial swelling Has patient had a PCN reaction causing immediate rash, facial/tongue/throat swelling, SOB or lightheadedness with hypotension: Yes Has patient had a PCN reaction causing severe rash involving mucus membranes or skin necrosis: Yes Has patient had a PCN reaction that  required hospitalization No Has patient had a PCN reaction occurring within the last 10 years: No If all of the above answers are "NO", then may proceed with Cephalosporin use.   . Ambien [Zolpidem]     Hallucination   . Aspirin Hives  . Oxytetracycline Hives    Other reaction(s): Unknown       The results of significant diagnostics from this hospitalization (including imaging, microbiology, ancillary and laboratory) are listed below for reference.    Labs: BNP (last 3 results) Recent Labs    09/02/19 1044  BNP 629.5*   Basic Metabolic Panel: Recent Labs  Lab 09/02/19  1044 09/03/19 0238 09/04/19 0531 09/08/19 1008  NA 136 135 141 136  K 4.0 3.8 4.2 5.0  CL 100 100 104 100  CO2 _0 GLUCOSE 237* 275* 159* 191*  BUN 21 25* 30* 31*  CREATININE 1.26* 1.25* 1.19* 1.13*  CALCIUM 8.5* 8.3* 8.6* 8.8*   Liver Function Tests: Recent Labs  Lab 09/02/19 1044  AST 13*  ALT 9  ALKPHOS 74  BILITOT 0.7  PROT 6.8  ALBUMIN 3.4*   No results for input(s): LIPASE, AMYLASE in the last 168 hours. No results for input(s): AMMONIA in the last 168 hours. CBC: Recent Labs  Lab 09/02/19 1044 09/03/19 0238 09/04/19 0531 09/07/19 0607  WBC 12.2* 7.8 7.8 7.4  NEUTROABS 10.9*  --  6.5  --   HGB 10.2* 9.7* 9.1* 10.5*  HCT 32.6* 31.2* 28.1* 33.0*  MCV 84.2 85.2 82.4 83.3  PLT 251 196 219 273   Cardiac Enzymes: No results for input(s): CKTOTAL, CKMB, CKMBINDEX, TROPONINI in the last 168 hours. BNP: Invalid input(s): POCBNP CBG: Recent Labs  Lab 09/07/19 1615 09/07/19 2115 09/08/19 0737 09/08/19 1142 09/08/19 1635  GLUCAP 156* 150* 142* 135* 142*   D-Dimer No results for input(s): DDIMER in the last 72 hours. Hgb A1c No results for input(s): HGBA1C in the last 72 hours. Lipid Profile No results for input(s): CHOL, HDL, LDLCALC, TRIG, CHOLHDL, LDLDIRECT in the last 72 hours. Thyroid function studies No results for input(s): TSH, T4TOTAL, T3FREE, THYROIDAB in  the last 72 hours.  Invalid input(s): FREET3 Anemia work up No results for input(s): VITAMINB12, FOLATE, FERRITIN, TIBC, IRON, RETICCTPCT in the last 72 hours. Urinalysis    Component Value Date/Time   COLORURINE YELLOW (A) 09/03/2019 1307   APPEARANCEUR HAZY (A) 09/03/2019 1307   APPEARANCEUR Cloudy (A) 09/01/2019 1420   LABSPEC 1.019 09/03/2019 1307   PHURINE 5.0 09/03/2019 1307   GLUCOSEU 50 (A) 09/03/2019 1307   GLUCOSEU NEGATIVE 08/02/2017 1427   HGBUR NEGATIVE 09/03/2019 1307   BILIRUBINUR NEGATIVE 09/03/2019 1307   BILIRUBINUR Negative 09/01/2019 1420   BILIRUBINUR 1+ 09/15/2017 1319   KETONESUR NEGATIVE 09/03/2019 1307   PROTEINUR NEGATIVE 09/03/2019 1307   UROBILINOGEN 0.2 09/15/2017 1319   UROBILINOGEN 0.2 08/02/2017 1427   NITRITE NEGATIVE 09/03/2019 1307   LEUKOCYTESUR NEGATIVE 09/03/2019 1307   Sepsis Labs Invalid input(s): PROCALCITONIN,  WBC,  LACTICIDVEN Microbiology Recent Results (from the past 240 hour(s))  Microscopic Examination     Status: Abnormal   Collection Time: 09/01/19  2:20 PM   Urine  Result Value Ref Range Status   WBC, UA >30 (H) 0 - 5 /hpf Final   RBC None seen 0 - 2 /hpf Final   Epithelial Cells (non renal) 0-10 0 - 10 /hpf Final   Bacteria, UA Many (A) None seen/Few Final  Culture, blood (Routine x 2)     Status: Abnormal   Collection Time: 09/02/19 10:44 AM   Specimen: BLOOD  Result Value Ref Range Status   Specimen Description   Final    BLOOD R UPPER ARM Performed at [facility], [address], [place], [place] [address]    Special Requests   Final    BOTTLES DRAWN AEROBIC AND ANAEROBIC Blood Culture adequate volume Performed at [facility], [address] [place], [place] [address]    Culture  Setup Time   Final    Organism ID to follow IN BOTH AEROBIC AND ANAEROBIC BOTTLES [place]  TO, READ BACK BY AND VERIFIED WITH: SCOTT HALL ON 09/03/19 AT [place] Performed at  [facility], [address] [place], [place] [address]    Culture GROUP B STREP(S.AGALACTIAE)ISOLATED (A)  Final   Report Status 09/05/2019 FINAL  Final   Organism ID, Bacteria GROUP B STREP(S.AGALACTIAE)ISOLATED  Final      Susceptibility   Group b strep(s.agalactiae)isolated - MIC*    CLINDAMYCIN >=1 RESISTANT Resistant     AMPICILLIN <=0.25 SENSITIVE Sensitive     ERYTHROMYCIN >=8 RESISTANT Resistant     VANCOMYCIN 0.5 SENSITIVE Sensitive     CEFTRIAXONE <=0.12 SENSITIVE Sensitive     LEVOFLOXACIN 1 SENSITIVE Sensitive     * GROUP B STREP(S.AGALACTIAE)ISOLATED  Culture, blood (Routine x 2)     Status: Abnormal   Collection Time: 09/02/19 10:44 AM   Specimen: BLOOD  Result Value Ref Range Status   Specimen Description   Final    BLOOD RFA Performed at [facility], [address], [place], [place] [address]    Special Requests   Final    BOTTLES DRAWN AEROBIC AND ANAEROBIC Blood Culture adequate volume Performed at [facility], [address] [place], [place] [address]    Culture  Setup Time   Final    GRAM POSITIVE COCCI AEROBIC BOTTLE ONLY CRITICAL VALUE NOTED.  VALUE IS CONSISTENT WITH PREVIOUSLY REPORTED AND CALLED VALUE. Performed at [facility], [address] [place], [place] [address]    Culture (A)  Final    GROUP B STREP(S.AGALACTIAE)ISOLATED SUSCEPTIBILITIES PERFORMED ON PREVIOUS CULTURE WITHIN THE LAST 5 DAYS. Performed at [facility], [place] [address], [place], [place] [address]    Report Status 09/05/2019 FINAL  Final  Blood Culture ID Panel (Reflexed)     Status: Abnormal   Collection Time: 09/02/19 10:44 AM  Result Value Ref Range Status   Enterococcus species NOT DETECTED NOT DETECTED Final   Listeria monocytogenes NOT DETECTED NOT DETECTED Final   Staphylococcus species NOT DETECTED NOT DETECTED Final   Staphylococcus aureus (BCID) NOT DETECTED NOT DETECTED Final   Streptococcus species DETECTED (A)  NOT DETECTED Final    Comment: CRITICAL RESULT CALLED TO, READ BACK BY AND VERIFIED WITH: SCOTT HALL ON 09/03/19 AT 0437 [place]    Streptococcus agalactiae DETECTED (A) NOT DETECTED Final    Comment: CRITICAL RESULT CALLED TO, READ BACK BY AND VERIFIED WITH: SCOTT HALL ON 09/03/19 AT 9509 [place]    Streptococcus pneumoniae NOT DETECTED NOT DETECTED Final   Streptococcus pyogenes NOT DETECTED NOT DETECTED Final   Acinetobacter baumannii NOT DETECTED NOT DETECTED Final   Enterobacteriaceae species NOT DETECTED NOT DETECTED Final   Enterobacter cloacae complex NOT DETECTED NOT DETECTED Final   Escherichia coli NOT DETECTED NOT DETECTED Final   Klebsiella oxytoca NOT DETECTED NOT DETECTED Final   Klebsiella pneumoniae NOT DETECTED NOT DETECTED Final   Proteus species NOT DETECTED NOT DETECTED Final   Serratia marcescens NOT DETECTED NOT DETECTED Final   Haemophilus influenzae NOT DETECTED NOT DETECTED Final   Neisseria meningitidis NOT DETECTED NOT DETECTED Final   Pseudomonas aeruginosa NOT DETECTED NOT DETECTED Final   Candida albicans NOT DETECTED NOT DETECTED Final   Candida glabrata NOT DETECTED NOT DETECTED Final   Candida krusei NOT DETECTED NOT DETECTED Final   Candida parapsilosis NOT DETECTED NOT DETECTED Final   Candida tropicalis NOT DETECTED NOT DETECTED Final    Comment: Performed at [facility], [address] [place], [place]  [address]  SARS Coronavirus 2 by RT PCR (hospital order, performed in [facility] hospital lab) Nasopharyngeal Nasopharyngeal Swab     Status: None   Collection Time: 09/02/19  3:28 PM   Specimen: Nasopharyngeal Swab  Result Value Ref Range Status   SARS Coronavirus 2 NEGATIVE NEGATIVE Final    Comment: (NOTE) SARS-CoV-2 target nucleic acids are NOT DETECTED.  The SARS-CoV-2 RNA is generally detectable in upper and lower respiratory specimens during the acute phase of infection. The lowest concentration of SARS-CoV-2 viral copies this assay  can detect is 250 copies / mL. A negative result does not preclude SARS-CoV-2 infection and should not be used as the sole basis for treatment or other patient management decisions.  A negative result may occur with improper specimen collection / handling, submission of specimen other than nasopharyngeal swab, presence of viral mutation(s) within the areas targeted by this assay, and inadequate number of viral copies (<250 copies / mL). A negative result must be combined with clinical observations, patient history, and epidemiological information.  Fact Sheet for Patients:   StrictlyIdeas.no  Fact Sheet for Healthcare Providers: BankingDealers.co.za  This test is not yet approved or  cleared by the [place] FDA and has been authorized for detection and/or diagnosis of SARS-CoV-2 by FDA under an Emergency Use Authorization (EUA).  This EUA will remain in effect (meaning this test can be used) for the duration of the COVID-19 declaration under Section 564(b)(1) of the Act, 21 U.S.C. section 360bbb-3(b)(1), unless the authorization is terminated or revoked sooner.  Performed at [facility], [address] [place], [place] [address]   Urine Culture     Status: Abnormal   Collection Time: 09/03/19  1:07 PM   Specimen: Urine, Random  Result Value Ref Range Status   Specimen Description   Final    URINE, RANDOM Performed at [facility], [address], [place], [place] [address]    Special Requests   Final    NONE Performed at [facility], [address] [place], [place] [address]    Culture MULTIPLE SPECIES PRESENT, SUGGEST RECOLLECTION (A)  Final   Report Status 09/04/2019 FINAL  Final  CULTURE, BLOOD (ROUTINE X 2) w Reflex to ID Panel     Status: None (Preliminary result)   Collection Time: 09/04/19  9:13 AM   Specimen: BLOOD  Result Value Ref Range Status   Specimen Description BLOOD RIGHT  ANTECUBITAL  Final   Special Requests   Final    BOTTLES DRAWN AEROBIC AND ANAEROBIC Blood Culture adequate volume   Culture   Final    NO GROWTH 4 DAYS Performed at [facility], [address], [place], [place] [address]    Report Status PENDING  Incomplete  CULTURE, BLOOD (ROUTINE X 2) w Reflex to ID Panel     Status: None (Preliminary result)   Collection Time: 09/04/19  9:39 AM   Specimen: BLOOD  Result Value Ref Range Status   Specimen Description BLOOD LEFT WRIST  Final   Special Requests   Final    BOTTLES DRAWN AEROBIC AND ANAEROBIC Blood Culture results may not be optimal due to an inadequate volume of blood received in culture bottles   Culture   Final    NO GROWTH 4 DAYS Performed at [facility], [address], [place], [place] [address]    Report Status PENDING  Incomplete    Procedures/Studies: CT Angio Chest PE W and/or Wo Contrast  Result Date:  09/02/2019 CLINICAL DATA:  Shortness of breath EXAM: CT ANGIOGRAPHY CHEST WITH CONTRAST TECHNIQUE: Multidetector CT imaging of the chest was performed using the standard protocol during bolus administration of intravenous contrast. Multiplanar CT image reconstructions and MIPs were obtained to evaluate the vascular anatomy. CONTRAST:  57m OMNIPAQUE IOHEXOL 350 MG/ML SOLN COMPARISON:  07/28/2018 FINDINGS: Cardiovascular: Satisfactory opacification of the pulmonary arteries to the segmental level. No evidence of pulmonary embolism. Cardiomegaly. No pericardial effusion. Mediastinum/Nodes: No enlarged mediastinal, hilar, or axillary lymph nodes. Thyroid gland, trachea, and esophagus demonstrate no significant findings. Lungs/Pleura: Bandlike scarring of the right lung base with elevation of the right hemidiaphragm. Heterogeneous airspace opacity of the right middle lobe (series 6, image 50). No pleural effusion or pneumothorax. Upper Abdomen: No acute abnormality. Partially imaged findings of partial gastrectomy in  the included upper abdomen. Musculoskeletal: No chest wall abnormality. No acute or significant osseous findings. Review of the MIP images confirms the above findings. IMPRESSION: 1. Negative examination for pulmonary embolism. 2. Heterogeneous airspace opacity of the right middle lobe concerning for infection. 3. Cardiomegaly. Electronically Signed   By: AEddie CandleM.D.   On: 09/02/2019 13:32   DG Chest Port 1 View  Result Date: 09/02/2019 CLINICAL DATA:  Altered mental status with weakness EXAM: PORTABLE CHEST 1 VIEW COMPARISON:  Chest radiograph and chest CT Jul 28, 2018 FINDINGS: There is stable elevation of the right hemidiaphragm. No edema or airspace opacity. Heart size and pulmonary vascularity normal. No adenopathy. There is a total shoulder replacement on the right. IMPRESSION: Elevation right hemidiaphragm, stable. No edema or airspace opacity. Stable cardiac silhouette. No adenopathy. Electronically Signed   By: WLowella GripIII M.D.   On: 09/02/2019 11:30   DG Foot 2 Views Left  Result Date: 09/03/2019 CLINICAL DATA:  Foot pain, unable to bear weight on left foot EXAM: LEFT FOOT - 2 VIEW; RIGHT FOOT - 2 VIEW COMPARISON:  05/29/2010 FINDINGS: No fracture or dislocation of the bilateral feet. There is a bunion deformity of the left great toe with crossover hammertoe deformity of the left second toe. Severe left first metatarsophalangeal arthrosis. Moderate bilateral midfoot arthrosis. Prior right fifth metatarsal osteotomy. Soft tissues are unremarkable. IMPRESSION: 1. No fracture or dislocation of the bilateral feet. 2. Severe left first metatarsophalangeal arthrosis. 3. Moderate bilateral midfoot arthrosis. Electronically Signed   By: AEddie CandleM.D.   On: 09/03/2019 15:26   DG Foot 2 Views Right  Result Date: 09/03/2019 CLINICAL DATA:  Foot pain, unable to bear weight on left foot EXAM: LEFT FOOT - 2 VIEW; RIGHT FOOT - 2 VIEW COMPARISON:  05/29/2010 FINDINGS: No fracture or  dislocation of the bilateral feet. There is a bunion deformity of the left great toe with crossover hammertoe deformity of the left second toe. Severe left first metatarsophalangeal arthrosis. Moderate bilateral midfoot arthrosis. Prior right fifth metatarsal osteotomy. Soft tissues are unremarkable. IMPRESSION: 1. No fracture or dislocation of the bilateral feet. 2. Severe left first metatarsophalangeal arthrosis. 3. Moderate bilateral midfoot arthrosis. Electronically Signed   By: AEddie CandleM.D.   On: 09/03/2019 15:26   MM 3D SCREEN BREAST BILATERAL  Result Date: 08/17/2019 CLINICAL DATA:  Screening. EXAM: DIGITAL SCREENING BILATERAL MAMMOGRAM WITH TOMO AND CAD COMPARISON:  Previous exam(s). ACR Breast Density Category b: There are scattered areas of fibroglandular density. FINDINGS: There are no findings suspicious for malignancy. Images were processed with CAD. IMPRESSION: No mammographic evidence of malignancy. A result letter of this screening mammogram will be mailed directly  to the patient. RECOMMENDATION: Screening mammogram in one year. (Code:SM-B-01Y) BI-RADS CATEGORY  1: Negative. Electronically Signed   By: Audie Pinto M.D.   On: 08/17/2019 16:35   ECHOCARDIOGRAM COMPLETE  Result Date: 09/03/2019    ECHOCARDIOGRAM REPORT   Patient Name:   NEVIAH BRAUD Date of Exam: 09/03/2019 Medical Rec #:  [id number]       Height:       65.0 in Accession #:    [id number]      Weight:       253.3 lb Date of Birth:  06-15-1950       BSA:          2.187 m Patient Age:    2 years        BP:           136/66 mmHg Patient Gender: F               HR:           89 bpm. Exam Location:  [facility] Procedure: 2D Echo Indications:     Chest Pain R07.9  History:         Patient has no prior history of Echocardiogram examinations.  Sonographer:     Arville Go RDCS Referring Phys:  [id number] AGBATA Diagnosing Phys: Kate Sable MD IMPRESSIONS  1. Left ventricular ejection fraction, by estimation, is 60  to 65%. The left ventricle has normal function. The left ventricle has no regional wall motion abnormalities. Left ventricular diastolic parameters were normal.  2. Right ventricular systolic function is normal. The right ventricular size is normal.  3. The mitral valve is normal in structure. No evidence of mitral valve regurgitation. No evidence of mitral stenosis.  4. The aortic valve is normal in structure. Aortic valve regurgitation is not visualized. No aortic stenosis is present.  5. The inferior vena cava is normal in size with <50% respiratory variability, suggesting right atrial pressure of 8 mmHg. FINDINGS  Left Ventricle: Left ventricular ejection fraction, by estimation, is 60 to 65%. The left ventricle has normal function. The left ventricle has no regional wall motion abnormalities. The left ventricular internal cavity size was normal in size. There is  no left ventricular hypertrophy. Left ventricular diastolic parameters were normal. Right Ventricle: The right ventricular size is normal. No increase in right ventricular wall thickness. Right ventricular systolic function is normal. Left Atrium: Left atrial size was normal in size. Right Atrium: Right atrial size was normal in size. Pericardium: There is no evidence of pericardial effusion. Mitral Valve: The mitral valve is normal in structure. Normal mobility of the mitral valve leaflets. No evidence of mitral valve regurgitation. No evidence of mitral valve stenosis. MV peak gradient, 6.9 mmHg. The mean mitral valve gradient is 3.0 mmHg. Tricuspid Valve: The tricuspid valve is normal in structure. Tricuspid valve regurgitation is mild . No evidence of tricuspid stenosis. Aortic Valve: The aortic valve is normal in structure. Aortic valve regurgitation is not visualized. No aortic stenosis is present. Pulmonic Valve: The pulmonic valve was not well visualized. Pulmonic valve regurgitation is not visualized. No evidence of pulmonic stenosis. Aorta:  The aortic root is normal in size and structure. Venous: The inferior vena cava is normal in size with less than 50% respiratory variability, suggesting right atrial pressure of 8 mmHg. IAS/Shunts: No atrial level shunt detected by color flow Doppler.  LEFT VENTRICLE PLAX 2D LVIDd:         4.79 cm  Diastology LVIDs:  3.15 cm  LV e' lateral:   10.30 cm/s LV PW:         1.16 cm  LV E/e' lateral: 10.3 LV IVS:        1.29 cm  LV e' medial:    8.49 cm/s LVOT diam:     1.90 cm  LV E/e' medial:  12.5 LV SV:         59 LV SV Index:   27 LVOT Area:     2.84 cm  RIGHT VENTRICLE RV Basal diam:  3.78 cm RV S prime:     13.90 cm/s LEFT ATRIUM             Index LA diam:        4.80 cm 2.19 cm/m LA Vol (A2C):   36.6 ml 16.73 ml/m LA Vol (A4C):   39.8 ml 18.20 ml/m LA Biplane Vol: 39.9 ml 18.24 ml/m  AORTIC VALVE             PULMONIC VALVE LVOT Vmax:   88.10 cm/s  PV Vmax:       1.28 m/s LVOT Vmean:  62.600 cm/s PV Peak grad:  6.6 mmHg LVOT VTI:    0.207 m  AORTA Ao Root diam: 3.20 cm Ao Asc diam:  3.00 cm MITRAL VALVE                TRICUSPID VALVE MV Area (PHT): 4.31 cm     TV Peak grad:   26.5 mmHg MV Peak grad:  6.9 mmHg     TV Vmax:        2.58 m/s MV Mean grad:  3.0 mmHg MV Vmax:       1.31 m/s     SHUNTS MV Vmean:      79.8 cm/s    Systemic VTI:  0.21 m MV Decel Time: 176 msec     Systemic Diam: 1.90 cm MV E velocity: 106.00 cm/s MV A velocity: 96.80 cm/s MV E/A ratio:  1.10 Kate Sable MD Electronically signed by Kate Sable MD Signature Date/Time: 09/03/2019/12:15:17 PM    Final     Time coordinating discharge: Over 30 minutes  SIGNED:   Guilford Shi, MD  [facility] 09/08/2019, 6:29 PM

## 2019-09-08 NOTE — Telephone Encounter (Signed)
Spoke with the Patient's husband. He states she is still in the hospital but may be discharged today or tomorrow.

## 2019-09-08 NOTE — Discharge Planning (Signed)
Patinet IVx2 and tele removed for DC to home.  Midline left intact for DC, d/t orders to receive IV anbx from outpatient infusion center.  Educated on home wound care and sample supplies given to assist with additional supplies from pharmacy.  Discharge papers given, explained and educated.  Script printed and given, once ready, will be wheeled to front and family transporting home via car.

## 2019-09-08 NOTE — TOC Progression Note (Signed)
Transition of Care [facility]) - Progression Note    Patient Details  Name: [name] MRN: [id number] Date of Birth: 1950/10/09  Transition of Care [facility]) CM/SW [place], RN Phone Number: 09/08/2019, 6:31 PM  Clinical Narrative:     Over past two days, [place] services have declined patient for [facility] and PT due to [employer] as payor source or lack of RN availability.  Alvis Lemmings, [place], [place], [place], [place], [facility].)  Manuela Schwartz, [facility] reached out to [employer] and asked for assistance, they offered to pay [facility] a 1 person contract for full payor source if they could provide [facility] and PT.  [facility] still declined patient due to payor source and relationship with contract status of reimbursement of care.  Unable to locate home health agency to service patient.  MD provider aware.         Expected Discharge Plan and Services           Expected Discharge Date: 09/08/19                                     Social Determinants of Health (SDOH) Interventions    Readmission Risk Interventions Readmission Risk Prevention Plan 07/30/2018  Transportation Screening Complete  PCP or Specialist Appt within 3-5 Days Complete  HRI or [place] Complete  Social Work Consult for [place] Planning/Counseling Patient refused  Palliative Care Screening Patient Refused  Medication Review Press photographer) Complete  Some recent data might be hidden

## 2019-09-08 NOTE — Discharge Summary (Signed)
Physician Discharge Summary  [name] FTD:[id number] DOB: [birth year] DOA: 09/02/2019  PCP: [name], [name], MD  Admit date: 09/02/2019 Discharge date: 09/08/2019 Consultations: Infectious diseases Admitted From: home Disposition: Home  Discharge Diagnoses:  Principal Problem:   Sepsis ([place]) Active Problems:   Acute respiratory failure with hypoxia ([place])   Bacteremia due to Gram-positive bacteria   Toxic encephalopathy   CAP (community acquired pneumonia)   Cellulitis   Anxiety and depression   Paroxysmal atrial fibrillation ([place]); CHA2DS2-Vasc Score 5. On Pradaxa   Essential hypertension   Hypothyroidism, unspecified   Morbid obesity with BMI of 40.0-44.9, adult (HCC)   Physical deconditioning   Elevated troponin   Pressure injury of skin   Hospital Course Summary: Admit date:09/02/2019 69 y/o female with h/oanxiety, depression, diabetes mellitus, COPD, hypertension, morbid obesity who presents to theEDfor evaluation ofproductive cough, chills,generalized weakness and shortness of breathfor 2 days.Patient had mild dysuria,was seen by urology as an outpatient and was told her urine was sterile.On the morning of her admission she was very weak and unable to get up out of bed. Her husband called EMS due to worsening weakness.Per EMS patient had a temp of 100.8 when they arrived and was noted to be hypoxic-low 80son RA. Patient was placed on oxygen supplementation with improvement in her pulse oximetry.She received Tylenol 975 mg x 1 dose as well as albuterol treatmentenroute. ED Course:WBC12.2with a left shift as well as pyuria.Trop 120,Chest x-ray shows no acute findings.EKG-minimal ST depression in the inferior leads.COVID screen -ve. Hospital course: Patient admitted to [facility]with heparin drip and IV abx.  RML pneumonia withSepsisand acute hypoxic respiratory failure:present on admission,withT-max of 100.21F,leukocytosis12.2K,lobar pneumonia (right  middle lobe),acute hypoxic respiratory failure andbacteremia. Patient received Rocephin and azithromycin during the hospital course.Patient was placed on oxygen supplementation with improvement now  on room air. White count has normalized.  Cellulitis, Bacteremia:Blood culture from 6/12 grew 3 out of 4 bottles gram-positive cocci-streptococci agalactiae-sensitivities pending,2D echocardiogram did not show any vegetations.Continue Rocephin, repeat blood culture sent 6/14-no growth so far. ESR 60, CRP 8.8. Seen by ID, Dr Delaine Lame, who feels source of infection likely skin/lower extremity cellulitis and wounds. They have recommended 10 days of IV antibiotics-stop date 09/14/19. Plan was for Home IV abx and once [facility] services set up-unfortunately patient does not have insurance approval and will have to stay as inpatient for completion of IV abx or come to infusion clinic for daily antibiotics. She has a midline which was placed on 6/17. After much discussion with health care team (Infectious diseases, Hospitalist team, pharmacy, SW, PT), patient wants to go home and husband willing to drive her to infusion clinic.Bedside nurse has educated family on midline care and clinical pharmacy has set up outpatient infusion center visits until 6/24 for daily antibiotics. Midline can be removed after completion of antibiotics.    Chronic left heelwound, right heel /ankle pain with erythema-sheisfollowedclosely byDr Baker->consulted->recommendedx rays which show severe left first metatarsophalangeal arthrosisand moderate bilateral midfoot arthrosis. Seen by podiatry and recommended local dressings for what appears to be a recurrent heel wound due to calcaneal gait-recommended local dressing and offloading boots.To follow-up podiatry clinic upon discharge.  Troponemia:could be secondary to demand ischemia from sepsis. Seen by Cardiology-their input appreciated. Echo 6/13-nl EF-D/Ced heparin drip,  likely demand ischemia as myoview -ve in 2020.  Toxic encephalopathy:Husband apparently expressed concerns about patient having intermittent hallucination for a year or so. She had a CT of the head in May 2020 and that was normal,husband is wondering  Xanax can cause intermittent hallucination,he is concerned that patient is on Xanax with unsteady feet,plan to taper Xanaxand follow up with pcp-reduced to 0.25 mg twice daily as needed. Reduced Cymbalta from 60 mg to 40 mg daily.She appeared to be awake alert oriented x3 during rounds last 2 days.Patient willing to continue this regimen until outpatient f/u with PCP.   Noninsulin-dependent type 2 diabetes,with hyperglycemia,A1c 6.8.Home medication Metforminon hold due to sepsis,on SSI here  Pyuria: urine culture with multiple species, repeat sample suggested. On antibiotics for pneumonia  CKD 3 a,anemia of chronic disease:Creatinine appears at baseline, in fact improved. Renal dosing meds  Chronic A. Fib,rate controlled, continueToprol-XL and Pradaxa  Hypertension:On Toprol-XL,losartan HCTZ. Been on Norvasc here as well but given concern for chronic leg edema, changed to hydralazine on discharge.   Hypothyroidism:Continue Synthroid  Class 3 obesity:Body mass index is 42.15 kg/m2. Impacting overall health, ambulation, fall risk. Advised weight loss. Follow up PCP regarding further recommendations  Discharge Exam:   Vitals:   09/08/19 0736 09/08/19 0740 09/08/19 1142 09/08/19 1520  BP: (!) 152/62  111/66 128/81  Pulse: 63  65 (!) 106  Resp: '16  19 16  '$ Temp: 98.1 F (36.7 C)  98.1 F (36.7 C) 97.9 F (36.6 C)  TempSrc: Oral  Oral Oral  SpO2: 98% 93% 93% 96%  Weight:      Height:        General: Pt is alert, awake, not in acute distress Cardiovascular: RRR, S1/S2 +, no rubs, no gallops Respiratory: CTA bilaterally, no wheezing, no rhonchi Abdominal:Obese, soft,NT, ND, bowel sounds + Extremities: Midline in  Left arm, cellulitis in rt thigh improving, chronic stasis dermatitis and old wounds R>L Offloading boot and local dressing on left heel  Discharge Condition:Stable CODE STATUS: Full code Diet recommendation: Recommendations for Outpatient Follow-up:  1. Follow up with PCP: 3-5 days 2. Follow up with consultants: Podiatry in 1-2 weeks, Infusion center daily until 6/24, cardiology as needed 3. Please obtain follow up labs including: CBC/BMP of f/u visit  Home Health services upon discharge: referred to [facility] PT/RN but not approved by insurance Equipment/Devices upon discharge:walker at home, off loading boot, dressing supplies for midline in LUE.    Discharge Instructions:  Discharge Instructions    Call MD for:  extreme fatigue   Complete by: As directed    Call MD for:  persistant nausea and vomiting   Complete by: As directed    Call MD for:  redness, tenderness, or signs of infection (pain, swelling, redness, odor or green/yellow discharge around incision site)   Complete by: As directed    Call MD for:  severe uncontrolled pain   Complete by: As directed    Call MD for:  temperature >100.4   Complete by: As directed    Change dressing (specify)   Complete by: As directed    Dressing change: Change dressing daily to every other day to the left heel.  Leave accommodative felt padding on.  Apply mupirocin cream to the wound.  Apply 4 x 4 gauze.  ABD, Kerlix, Ace wrap.   Change dressing on IV access line weekly and PRN   Complete by: As directed    Diet - low sodium heart healthy   Complete by: As directed    Increase activity slowly   Complete by: As directed      Allergies as of 09/08/2019      Reactions   Morphine And Related Other (See Comments), Nausea Only   Patient  becomes very confused Other reaction(s): Other (See Comments), Other (See Comments) Patient becomes very confused Patient becomes very confused   Penicillins Hives, Shortness Of Breath, Swelling, Other  (See Comments)   Facial swelling Has patient had a PCN reaction causing immediate rash, facial/tongue/throat swelling, SOB or lightheadedness with hypotension: Yes Has patient had a PCN reaction causing severe rash involving mucus membranes or skin necrosis: Yes Has patient had a PCN reaction that required hospitalization No Has patient had a PCN reaction occurring within the last 10 years: No If all of the above answers are "NO", then may proceed with Cephalosporin use. Other reaction(s): Other (See Comments), Other (See Comments) Facial swelling Has patient had a PCN reaction causing immediate rash, facial/tongue/throat swelling, SOB or lightheadedness with hypotension: Yes Has patient had a PCN reaction causing severe rash involving mucus membranes or skin necrosis: Yes Has patient had a PCN reaction that required hospitalization No Has patient had a PCN reaction occurring within the last 10 years: No If all of the above answers are "NO", then may proceed with Cephalosporin use. Facial swelling Has patient had a PCN reaction causing immediate rash, facial/tongue/throat swelling, SOB or lightheadedness with hypotension: Yes Has patient had a PCN reaction causing severe rash involving mucus membranes or skin necrosis: Yes Has patient had a PCN reaction that required hospitalization No Has patient had a PCN reaction occurring within the last 10 years: No If all of the above answers are "NO", then may proceed with Cephalosporin use. Facial swelling Has patient had a PCN reaction causing immediate rash, facial/tongue/throat swelling, SOB or lightheadedness with hypotension: Yes Has patient had a PCN reaction causing severe rash involving mucus membranes or skin necrosis: Yes Has patient had a PCN reaction that required hospitalization No Has patient had a PCN reaction occurring within the last 10 years: No If all of the above answers are "NO", then may proceed with Cephalosporin use.   Ambien  [zolpidem]    Hallucination    Aspirin Hives   Oxytetracycline Hives   Other reaction(s): Unknown      Medication List    STOP taking these medications   Dexilant 60 MG capsule Generic drug: dexlansoprazole   meloxicam 15 MG tablet Commonly known as: MOBIC   sucralfate 1 g tablet Commonly known as: CARAFATE   sucralfate 1 GM/10ML suspension Commonly known as: CARAFATE     TAKE these medications   acetaminophen 325 MG tablet Commonly known as: TYLENOL Take 2 tablets (650 mg total) by mouth every 6 (six) hours as needed for mild pain or moderate pain (or Fever >/= 101).   Advair HFA 115-21 MCG/ACT inhaler Generic drug: fluticasone-salmeterol Inhale 2 puffs into the lungs daily. Rinse mouth thoroughly after use   albuterol (2.5 MG/3ML) 0.083% nebulizer solution Commonly known as: PROVENTIL Take 3 mLs (2.5 mg total) by nebulization every 6 (six) hours as needed for wheezing or shortness of breath. What changed: Another medication with the same name was changed. Make sure you understand how and when to take each.   albuterol 108 (90 Base) MCG/ACT inhaler Commonly known as: VENTOLIN HFA Inhale 1-2 puffs into the lungs every 4 (four) hours as needed for wheezing or shortness of breath. What changed: how much to take   ALPRAZolam 0.5 MG tablet Commonly known as: XANAX Take 0.5 tablets (0.25 mg total) by mouth 2 (two) times daily as needed for anxiety. What changed: how much to take   buPROPion 150 MG 24 hr tablet Commonly known as:  WELLBUTRIN XL Take 1 tablet (150 mg total) by mouth daily.   cefTRIAXone  IVPB Commonly known as: ROCEPHIN Inject 2 g into the vein daily for 6 days. Indication:  Group B streptococcus bacteremia, right leg cellulitis First Dose: Yes Last Day of Therapy:  09/14/2019 Labs - Once weekly:  CBC/D and CMP Method of administration: IV Push Method of administration may be changed at the discretion of home infusion pharmacist based upon assessment  of the patient and/or caregiver's ability to self-administer the medication ordered.   colchicine 0.6 MG tablet Take by mouth.   DULoxetine HCl 40 MG Cpep Take 40 mg by mouth daily. What changed:   medication strength  how much to take   estradiol 0.1 MG/GM vaginal cream Commonly known as: ESTRACE Place 1 Applicatorful vaginally every Monday, Wednesday, and Friday. Apply 0.'5mg'$  (pea-sized amount)  just inside the vaginal introitus with a finger-tip on Monday, Wednesday and Friday nights,   hydrALAZINE 25 MG tablet Commonly known as: APRESOLINE Take 1 tablet (25 mg total) by mouth 2 (two) times daily.   HYDROcodone-acetaminophen 5-325 MG tablet Commonly known as: NORCO/VICODIN Take 1 tablet by mouth every 8 (eight) hours as needed for severe pain. Must last 30 days What changed: Another medication with the same name was removed. Continue taking this medication, and follow the directions you see here.   levocetirizine 5 MG tablet Commonly known as: XYZAL Take 0.5 tablets (2.5 mg total) by mouth every evening. What changed: how much to take   levothyroxine 175 MCG tablet Commonly known as: SYNTHROID Take 1 tablet (175 mcg total) by mouth daily before breakfast.   losartan-hydrochlorothiazide 50-12.5 MG tablet Commonly known as: HYZAAR Take 1 tablet by mouth daily.   metFORMIN 1000 MG tablet Commonly known as: GLUCOPHAGE Take 1 tablet (1,000 mg total) by mouth 2 (two) times daily. With food   metoprolol succinate 50 MG 24 hr tablet Commonly known as: TOPROL-XL TAKE 1 TABLET BY MOUTH DAILY   montelukast 10 MG tablet Commonly known as: SINGULAIR Take 1 tablet (10 mg total) by mouth at bedtime.   mupirocin ointment 2 % Commonly known as: Bactroban Apply 1 application topically 2 (two) times daily. Skin wounds   nystatin cream Commonly known as: MYCOSTATIN Apply 1 application topically 2 (two) times daily. Mouth prn for 7 days as needed What changed:   when to take  this  reasons to take this   ondansetron 4 MG tablet Commonly known as: Zofran Take 1 tablet (4 mg total) by mouth every 8 (eight) hours as needed for nausea or vomiting.   pantoprazole 40 MG tablet Commonly known as: PROTONIX Take by mouth.   Pradaxa 150 MG Caps capsule Generic drug: dabigatran TAKE 1 CAPSULE TWICE DAILY   pregabalin 50 MG capsule Commonly known as: LYRICA Take 1 capsule (50 mg total) by mouth 3 (three) times daily.   Toviaz 8 MG Tb24 tablet Generic drug: fesoterodine TAKE ONE TABLET BY MOUTH EVERY DAY   trospium 20 MG tablet Commonly known as: SANCTURA Take 1 tablet (20 mg total) by mouth 2 (two) times daily.   Vitamin D (Ergocalciferol) 1.25 MG (50000 UNIT) Caps capsule Commonly known as: DRISDOL Take 1 capsule (50,000 Units total) by mouth once a week. Start taking on: September 15, 2019 What changed:   medication strength  See the new instructions.   zinc oxide 20 % ointment Apply 1 application topically as needed for irritation.  Discharge Care Instructions  (From admission, onward)         Start     Ordered   09/08/19 0000  Change dressing on IV access line weekly and PRN  (Home infusion instructions - [facility] )        09/08/19 1815   09/08/19 0000  Change dressing (specify)       Comments: Dressing change: Change dressing daily to every other day to the left heel.  Leave accommodative felt padding on.  Apply mupirocin cream to the wound.  Apply 4 x 4 gauze.  ABD, Kerlix, Ace wrap.   09/08/19 1815          Follow-up Information    Troxler, Rodman Key, DPM. Schedule an appointment as soon as possible for a visit in 1 week(s).   Specialty: Podiatry Contact information: [place] [address] [phone]              Allergies  Allergen Reactions  . Morphine And Related Other (See Comments) and Nausea Only    Patient becomes very confused Other  reaction(s): Other (See Comments), Other (See Comments) Patient becomes very confused Patient becomes very confused   . Penicillins Hives, Shortness Of Breath, Swelling and Other (See Comments)    Facial swelling Has patient had a PCN reaction causing immediate rash, facial/tongue/throat swelling, SOB or lightheadedness with hypotension: Yes Has patient had a PCN reaction causing severe rash involving mucus membranes or skin necrosis: Yes Has patient had a PCN reaction that required hospitalization No Has patient had a PCN reaction occurring within the last 10 years: No If all of the above answers are "NO", then may proceed with Cephalosporin use.  Other reaction(s): Other (See Comments), Other (See Comments) Facial swelling Has patient had a PCN reaction causing immediate rash, facial/tongue/throat swelling, SOB or lightheadedness with hypotension: Yes Has patient had a PCN reaction causing severe rash involving mucus membranes or skin necrosis: Yes Has patient had a PCN reaction that required hospitalization No Has patient had a PCN reaction occurring within the last 10 years: No If all of the above answers are "NO", then may proceed with Cephalosporin use. Facial swelling Has patient had a PCN reaction causing immediate rash, facial/tongue/throat swelling, SOB or lightheadedness with hypotension: Yes Has patient had a PCN reaction causing severe rash involving mucus membranes or skin necrosis: Yes Has patient had a PCN reaction that required hospitalization No Has patient had a PCN reaction occurring within the last 10 years: No If all of the above answers are "NO", then may proceed with Cephalosporin use. Facial swelling Has patient had a PCN reaction causing immediate rash, facial/tongue/throat swelling, SOB or lightheadedness with hypotension: Yes Has patient had a PCN reaction causing severe rash involving mucus membranes or skin necrosis: Yes Has patient had a PCN reaction that  required hospitalization No Has patient had a PCN reaction occurring within the last 10 years: No If all of the above answers are "NO", then may proceed with Cephalosporin use.   . Ambien [Zolpidem]     Hallucination   . Aspirin Hives  . Oxytetracycline Hives    Other reaction(s): Unknown       The results of significant diagnostics from this hospitalization (including imaging, microbiology, ancillary and laboratory) are listed below for reference.    Labs: BNP (last 3 results) Recent Labs    09/02/19 1044  BNP 629.5*   Basic Metabolic Panel: Recent Labs  Lab 09/02/19  1044 09/03/19 0238 09/04/19 0531 09/08/19 1008  NA 136 135 141 136  K 4.0 3.8 4.2 5.0  CL 100 100 104 100  CO2 _0 GLUCOSE 237* 275* 159* 191*  BUN 21 25* 30* 31*  CREATININE 1.26* 1.25* 1.19* 1.13*  CALCIUM 8.5* 8.3* 8.6* 8.8*   Liver Function Tests: Recent Labs  Lab 09/02/19 1044  AST 13*  ALT 9  ALKPHOS 74  BILITOT 0.7  PROT 6.8  ALBUMIN 3.4*   No results for input(s): LIPASE, AMYLASE in the last 168 hours. No results for input(s): AMMONIA in the last 168 hours. CBC: Recent Labs  Lab 09/02/19 1044 09/03/19 0238 09/04/19 0531 09/07/19 0607  WBC 12.2* 7.8 7.8 7.4  NEUTROABS 10.9*  --  6.5  --   HGB 10.2* 9.7* 9.1* 10.5*  HCT 32.6* 31.2* 28.1* 33.0*  MCV 84.2 85.2 82.4 83.3  PLT 251 196 219 273   Cardiac Enzymes: No results for input(s): CKTOTAL, CKMB, CKMBINDEX, TROPONINI in the last 168 hours. BNP: Invalid input(s): POCBNP CBG: Recent Labs  Lab 09/07/19 1615 09/07/19 2115 09/08/19 0737 09/08/19 1142 09/08/19 1635  GLUCAP 156* 150* 142* 135* 142*   D-Dimer No results for input(s): DDIMER in the last 72 hours. Hgb A1c No results for input(s): HGBA1C in the last 72 hours. Lipid Profile No results for input(s): CHOL, HDL, LDLCALC, TRIG, CHOLHDL, LDLDIRECT in the last 72 hours. Thyroid function studies No results for input(s): TSH, T4TOTAL, T3FREE, THYROIDAB in  the last 72 hours.  Invalid input(s): FREET3 Anemia work up No results for input(s): VITAMINB12, FOLATE, FERRITIN, TIBC, IRON, RETICCTPCT in the last 72 hours. Urinalysis    Component Value Date/Time   COLORURINE YELLOW (A) 09/03/2019 1307   APPEARANCEUR HAZY (A) 09/03/2019 1307   APPEARANCEUR Cloudy (A) 09/01/2019 1420   LABSPEC 1.019 09/03/2019 1307   PHURINE 5.0 09/03/2019 1307   GLUCOSEU 50 (A) 09/03/2019 1307   GLUCOSEU NEGATIVE 08/02/2017 1427   HGBUR NEGATIVE 09/03/2019 1307   BILIRUBINUR NEGATIVE 09/03/2019 1307   BILIRUBINUR Negative 09/01/2019 1420   BILIRUBINUR 1+ 09/15/2017 1319   KETONESUR NEGATIVE 09/03/2019 1307   PROTEINUR NEGATIVE 09/03/2019 1307   UROBILINOGEN 0.2 09/15/2017 1319   UROBILINOGEN 0.2 08/02/2017 1427   NITRITE NEGATIVE 09/03/2019 1307   LEUKOCYTESUR NEGATIVE 09/03/2019 1307   Sepsis Labs Invalid input(s): PROCALCITONIN,  WBC,  LACTICIDVEN Microbiology Recent Results (from the past 240 hour(s))  Microscopic Examination     Status: Abnormal   Collection Time: 09/01/19  2:20 PM   Urine  Result Value Ref Range Status   WBC, UA >30 (H) 0 - 5 /hpf Final   RBC None seen 0 - 2 /hpf Final   Epithelial Cells (non renal) 0-10 0 - 10 /hpf Final   Bacteria, UA Many (A) None seen/Few Final  Culture, blood (Routine x 2)     Status: Abnormal   Collection Time: 09/02/19 10:44 AM   Specimen: BLOOD  Result Value Ref Range Status   Specimen Description   Final    BLOOD R UPPER ARM Performed at [facility], [address], [place], [place] [address]    Special Requests   Final    BOTTLES DRAWN AEROBIC AND ANAEROBIC Blood Culture adequate volume Performed at [facility], [address] [place], [place] [address]    Culture  Setup Time   Final    Organism ID to follow IN BOTH AEROBIC AND ANAEROBIC BOTTLES [place]  TO, READ BACK BY AND VERIFIED WITH: SCOTT HALL ON 09/03/19 AT [facility] Performed at  [facility], [address] [place], [place] [address]    Culture GROUP B STREP(S.AGALACTIAE)ISOLATED (A)  Final   Report Status 09/05/2019 FINAL  Final   Organism ID, Bacteria GROUP B STREP(S.AGALACTIAE)ISOLATED  Final      Susceptibility   Group b strep(s.agalactiae)isolated - MIC*    CLINDAMYCIN >=1 RESISTANT Resistant     AMPICILLIN <=0.25 SENSITIVE Sensitive     ERYTHROMYCIN >=8 RESISTANT Resistant     VANCOMYCIN 0.5 SENSITIVE Sensitive     CEFTRIAXONE <=0.12 SENSITIVE Sensitive     LEVOFLOXACIN 1 SENSITIVE Sensitive     * GROUP B STREP(S.AGALACTIAE)ISOLATED  Culture, blood (Routine x 2)     Status: Abnormal   Collection Time: 09/02/19 10:44 AM   Specimen: BLOOD  Result Value Ref Range Status   Specimen Description   Final    BLOOD RFA Performed at [facility], [address], [place], [place] [address]    Special Requests   Final    BOTTLES DRAWN AEROBIC AND ANAEROBIC Blood Culture adequate volume Performed at [facility], [address] [place], [place] [address]    Culture  Setup Time   Final    GRAM POSITIVE COCCI AEROBIC BOTTLE ONLY CRITICAL VALUE NOTED.  VALUE IS CONSISTENT WITH PREVIOUSLY REPORTED AND CALLED VALUE. Performed at [facility], [address] [place], [place] [address]    Culture (A)  Final    GROUP B STREP(S.AGALACTIAE)ISOLATED SUSCEPTIBILITIES PERFORMED ON PREVIOUS CULTURE WITHIN THE LAST 5 DAYS. Performed at [facility], [place] [address], [place], [place] [address]    Report Status 09/05/2019 FINAL  Final  Blood Culture ID Panel (Reflexed)     Status: Abnormal   Collection Time: 09/02/19 10:44 AM  Result Value Ref Range Status   Enterococcus species NOT DETECTED NOT DETECTED Final   Listeria monocytogenes NOT DETECTED NOT DETECTED Final   Staphylococcus species NOT DETECTED NOT DETECTED Final   Staphylococcus aureus (BCID) NOT DETECTED NOT DETECTED Final   Streptococcus species DETECTED (A)  NOT DETECTED Final    Comment: CRITICAL RESULT CALLED TO, READ BACK BY AND VERIFIED WITH: SCOTT HALL ON 09/03/19 AT 0437 [place]    Streptococcus agalactiae DETECTED (A) NOT DETECTED Final    Comment: CRITICAL RESULT CALLED TO, READ BACK BY AND VERIFIED WITH: SCOTT HALL ON 09/03/19 AT 9509 [place]    Streptococcus pneumoniae NOT DETECTED NOT DETECTED Final   Streptococcus pyogenes NOT DETECTED NOT DETECTED Final   Acinetobacter baumannii NOT DETECTED NOT DETECTED Final   Enterobacteriaceae species NOT DETECTED NOT DETECTED Final   Enterobacter cloacae complex NOT DETECTED NOT DETECTED Final   Escherichia coli NOT DETECTED NOT DETECTED Final   Klebsiella oxytoca NOT DETECTED NOT DETECTED Final   Klebsiella pneumoniae NOT DETECTED NOT DETECTED Final   Proteus species NOT DETECTED NOT DETECTED Final   Serratia marcescens NOT DETECTED NOT DETECTED Final   Haemophilus influenzae NOT DETECTED NOT DETECTED Final   Neisseria meningitidis NOT DETECTED NOT DETECTED Final   Pseudomonas aeruginosa NOT DETECTED NOT DETECTED Final   Candida albicans NOT DETECTED NOT DETECTED Final   Candida glabrata NOT DETECTED NOT DETECTED Final   Candida krusei NOT DETECTED NOT DETECTED Final   Candida parapsilosis NOT DETECTED NOT DETECTED Final   Candida tropicalis NOT DETECTED NOT DETECTED Final    Comment: Performed at [facility], [address] [place], [place]  [address]  SARS Coronavirus 2 by RT PCR (hospital order, performed in [facility] hospital lab) Nasopharyngeal Nasopharyngeal Swab     Status: None   Collection Time: 09/02/19  3:28 PM   Specimen: Nasopharyngeal Swab  Result Value Ref Range Status   SARS Coronavirus 2 NEGATIVE NEGATIVE Final    Comment: (NOTE) SARS-CoV-2 target nucleic acids are NOT DETECTED.  The SARS-CoV-2 RNA is generally detectable in upper and lower respiratory specimens during the acute phase of infection. The lowest concentration of SARS-CoV-2 viral copies this assay  can detect is 250 copies / mL. A negative result does not preclude SARS-CoV-2 infection and should not be used as the sole basis for treatment or other patient management decisions.  A negative result may occur with improper specimen collection / handling, submission of specimen other than nasopharyngeal swab, presence of viral mutation(s) within the areas targeted by this assay, and inadequate number of viral copies (<250 copies / mL). A negative result must be combined with clinical observations, patient history, and epidemiological information.  Fact Sheet for Patients:   StrictlyIdeas.no  Fact Sheet for Healthcare Providers: BankingDealers.co.za  This test is not yet approved or  cleared by the [place] FDA and has been authorized for detection and/or diagnosis of SARS-CoV-2 by FDA under an Emergency Use Authorization (EUA).  This EUA will remain in effect (meaning this test can be used) for the duration of the COVID-19 declaration under Section 564(b)(1) of the Act, 21 U.S.C. section 360bbb-3(b)(1), unless the authorization is terminated or revoked sooner.  Performed at [facility], [address] [place], [place] [address]   Urine Culture     Status: Abnormal   Collection Time: 09/03/19  1:07 PM   Specimen: Urine, Random  Result Value Ref Range Status   Specimen Description   Final    URINE, RANDOM Performed at [facility], [address], [place], [place] [address]    Special Requests   Final    NONE Performed at [facility], [address] [place], [place] [address]    Culture MULTIPLE SPECIES PRESENT, SUGGEST RECOLLECTION (A)  Final   Report Status 09/04/2019 FINAL  Final  CULTURE, BLOOD (ROUTINE X 2) w Reflex to ID Panel     Status: None (Preliminary result)   Collection Time: 09/04/19  9:13 AM   Specimen: BLOOD  Result Value Ref Range Status   Specimen Description BLOOD RIGHT  ANTECUBITAL  Final   Special Requests   Final    BOTTLES DRAWN AEROBIC AND ANAEROBIC Blood Culture adequate volume   Culture   Final    NO GROWTH 4 DAYS Performed at [facility], [address], [place], [place] [address]    Report Status PENDING  Incomplete  CULTURE, BLOOD (ROUTINE X 2) w Reflex to ID Panel     Status: None (Preliminary result)   Collection Time: 09/04/19  9:39 AM   Specimen: BLOOD  Result Value Ref Range Status   Specimen Description BLOOD LEFT WRIST  Final   Special Requests   Final    BOTTLES DRAWN AEROBIC AND ANAEROBIC Blood Culture results may not be optimal due to an inadequate volume of blood received in culture bottles   Culture   Final    NO GROWTH 4 DAYS Performed at [facility], [address], [place], [place] [address]    Report Status PENDING  Incomplete    Procedures/Studies: CT Angio Chest PE W and/or Wo Contrast  Result Date:  09/02/2019 CLINICAL DATA:  Shortness of breath EXAM: CT ANGIOGRAPHY CHEST WITH CONTRAST TECHNIQUE: Multidetector CT imaging of the chest was performed using the standard protocol during bolus administration of intravenous contrast. Multiplanar CT image reconstructions and MIPs were obtained to evaluate the vascular anatomy. CONTRAST:  57m OMNIPAQUE IOHEXOL 350 MG/ML SOLN COMPARISON:  07/28/2018 FINDINGS: Cardiovascular: Satisfactory opacification of the pulmonary arteries to the segmental level. No evidence of pulmonary embolism. Cardiomegaly. No pericardial effusion. Mediastinum/Nodes: No enlarged mediastinal, hilar, or axillary lymph nodes. Thyroid gland, trachea, and esophagus demonstrate no significant findings. Lungs/Pleura: Bandlike scarring of the right lung base with elevation of the right hemidiaphragm. Heterogeneous airspace opacity of the right middle lobe (series 6, image 50). No pleural effusion or pneumothorax. Upper Abdomen: No acute abnormality. Partially imaged findings of partial gastrectomy in  the included upper abdomen. Musculoskeletal: No chest wall abnormality. No acute or significant osseous findings. Review of the MIP images confirms the above findings. IMPRESSION: 1. Negative examination for pulmonary embolism. 2. Heterogeneous airspace opacity of the right middle lobe concerning for infection. 3. Cardiomegaly. Electronically Signed   By: AEddie CandleM.D.   On: 09/02/2019 13:32   DG Chest Port 1 View  Result Date: 09/02/2019 CLINICAL DATA:  Altered mental status with weakness EXAM: PORTABLE CHEST 1 VIEW COMPARISON:  Chest radiograph and chest CT Jul 28, 2018 FINDINGS: There is stable elevation of the right hemidiaphragm. No edema or airspace opacity. Heart size and pulmonary vascularity normal. No adenopathy. There is a total shoulder replacement on the right. IMPRESSION: Elevation right hemidiaphragm, stable. No edema or airspace opacity. Stable cardiac silhouette. No adenopathy. Electronically Signed   By: WLowella GripIII M.D.   On: 09/02/2019 11:30   DG Foot 2 Views Left  Result Date: 09/03/2019 CLINICAL DATA:  Foot pain, unable to bear weight on left foot EXAM: LEFT FOOT - 2 VIEW; RIGHT FOOT - 2 VIEW COMPARISON:  05/29/2010 FINDINGS: No fracture or dislocation of the bilateral feet. There is a bunion deformity of the left great toe with crossover hammertoe deformity of the left second toe. Severe left first metatarsophalangeal arthrosis. Moderate bilateral midfoot arthrosis. Prior right fifth metatarsal osteotomy. Soft tissues are unremarkable. IMPRESSION: 1. No fracture or dislocation of the bilateral feet. 2. Severe left first metatarsophalangeal arthrosis. 3. Moderate bilateral midfoot arthrosis. Electronically Signed   By: AEddie CandleM.D.   On: 09/03/2019 15:26   DG Foot 2 Views Right  Result Date: 09/03/2019 CLINICAL DATA:  Foot pain, unable to bear weight on left foot EXAM: LEFT FOOT - 2 VIEW; RIGHT FOOT - 2 VIEW COMPARISON:  05/29/2010 FINDINGS: No fracture or  dislocation of the bilateral feet. There is a bunion deformity of the left great toe with crossover hammertoe deformity of the left second toe. Severe left first metatarsophalangeal arthrosis. Moderate bilateral midfoot arthrosis. Prior right fifth metatarsal osteotomy. Soft tissues are unremarkable. IMPRESSION: 1. No fracture or dislocation of the bilateral feet. 2. Severe left first metatarsophalangeal arthrosis. 3. Moderate bilateral midfoot arthrosis. Electronically Signed   By: AEddie CandleM.D.   On: 09/03/2019 15:26   MM 3D SCREEN BREAST BILATERAL  Result Date: 08/17/2019 CLINICAL DATA:  Screening. EXAM: DIGITAL SCREENING BILATERAL MAMMOGRAM WITH TOMO AND CAD COMPARISON:  Previous exam(s). ACR Breast Density Category b: There are scattered areas of fibroglandular density. FINDINGS: There are no findings suspicious for malignancy. Images were processed with CAD. IMPRESSION: No mammographic evidence of malignancy. A result letter of this screening mammogram will be mailed directly  to the patient. RECOMMENDATION: Screening mammogram in one year. (Code:SM-B-01Y) BI-RADS CATEGORY  1: Negative. Electronically Signed   By: Audie Pinto M.D.   On: 08/17/2019 16:35   ECHOCARDIOGRAM COMPLETE  Result Date: 09/03/2019    ECHOCARDIOGRAM REPORT   Patient Name:   [name] Date of Exam: 09/03/2019 Medical Rec #:  [id number]       Height:       65.0 in Accession #:    [id number]      Weight:       253.3 lb Date of Birth:  06-15-1950       BSA:          2.187 m Patient Age:    2 years        BP:           136/66 mmHg Patient Gender: F               HR:           89 bpm. Exam Location:  [facility] Procedure: 2D Echo Indications:     Chest Pain R07.9  History:         Patient has no prior history of Echocardiogram examinations.  Sonographer:     Arville Go RDCS Referring Phys:  [id number] AGBATA Diagnosing Phys: Kate Sable MD IMPRESSIONS  1. Left ventricular ejection fraction, by estimation, is 60  to 65%. The left ventricle has normal function. The left ventricle has no regional wall motion abnormalities. Left ventricular diastolic parameters were normal.  2. Right ventricular systolic function is normal. The right ventricular size is normal.  3. The mitral valve is normal in structure. No evidence of mitral valve regurgitation. No evidence of mitral stenosis.  4. The aortic valve is normal in structure. Aortic valve regurgitation is not visualized. No aortic stenosis is present.  5. The inferior vena cava is normal in size with <50% respiratory variability, suggesting right atrial pressure of 8 mmHg. FINDINGS  Left Ventricle: Left ventricular ejection fraction, by estimation, is 60 to 65%. The left ventricle has normal function. The left ventricle has no regional wall motion abnormalities. The left ventricular internal cavity size was normal in size. There is  no left ventricular hypertrophy. Left ventricular diastolic parameters were normal. Right Ventricle: The right ventricular size is normal. No increase in right ventricular wall thickness. Right ventricular systolic function is normal. Left Atrium: Left atrial size was normal in size. Right Atrium: Right atrial size was normal in size. Pericardium: There is no evidence of pericardial effusion. Mitral Valve: The mitral valve is normal in structure. Normal mobility of the mitral valve leaflets. No evidence of mitral valve regurgitation. No evidence of mitral valve stenosis. MV peak gradient, 6.9 mmHg. The mean mitral valve gradient is 3.0 mmHg. Tricuspid Valve: The tricuspid valve is normal in structure. Tricuspid valve regurgitation is mild . No evidence of tricuspid stenosis. Aortic Valve: The aortic valve is normal in structure. Aortic valve regurgitation is not visualized. No aortic stenosis is present. Pulmonic Valve: The pulmonic valve was not well visualized. Pulmonic valve regurgitation is not visualized. No evidence of pulmonic stenosis. Aorta:  The aortic root is normal in size and structure. Venous: The inferior vena cava is normal in size with less than 50% respiratory variability, suggesting right atrial pressure of 8 mmHg. IAS/Shunts: No atrial level shunt detected by color flow Doppler.  LEFT VENTRICLE PLAX 2D LVIDd:         4.79 cm  Diastology LVIDs:  3.15 cm  LV e' lateral:   10.30 cm/s LV PW:         1.16 cm  LV E/e' lateral: 10.3 LV IVS:        1.29 cm  LV e' medial:    8.49 cm/s LVOT diam:     1.90 cm  LV E/e' medial:  12.5 LV SV:         59 LV SV Index:   27 LVOT Area:     2.84 cm  RIGHT VENTRICLE RV Basal diam:  3.78 cm RV S prime:     13.90 cm/s LEFT ATRIUM             Index LA diam:        4.80 cm 2.19 cm/m LA Vol (A2C):   36.6 ml 16.73 ml/m LA Vol (A4C):   39.8 ml 18.20 ml/m LA Biplane Vol: 39.9 ml 18.24 ml/m  AORTIC VALVE             PULMONIC VALVE LVOT Vmax:   88.10 cm/s  PV Vmax:       1.28 m/s LVOT Vmean:  62.600 cm/s PV Peak grad:  6.6 mmHg LVOT VTI:    0.207 m  AORTA Ao Root diam: 3.20 cm Ao Asc diam:  3.00 cm MITRAL VALVE                TRICUSPID VALVE MV Area (PHT): 4.31 cm     TV Peak grad:   26.5 mmHg MV Peak grad:  6.9 mmHg     TV Vmax:        2.58 m/s MV Mean grad:  3.0 mmHg MV Vmax:       1.31 m/s     SHUNTS MV Vmean:      79.8 cm/s    Systemic VTI:  0.21 m MV Decel Time: 176 msec     Systemic Diam: 1.90 cm MV E velocity: 106.00 cm/s MV A velocity: 96.80 cm/s MV E/A ratio:  1.10 Kate Sable MD Electronically signed by Kate Sable MD Signature Date/Time: 09/03/2019/12:15:17 PM    Final      Time coordinating discharge: Over 30 minutes  SIGNED:   Guilford Shi, MD  [facility] 09/08/2019, 6:44 PM

## 2019-09-08 NOTE — Progress Notes (Addendum)
PT Cancellation Note  Patient Details Name: [name] MRN: [id number] DOB: 1950/04/07   Cancelled Treatment:    Reason Eval/Treat Not Completed: Other (comment)   Offered and encouraged session.  Pt declined stating she is awaiting her husband and discharge home.  She stated she is getting up daily and feels comfortable with how she is moving and does not feel like she needs further IP PT interventions. Will continue to monitor if pt is not DC today.   Chesley Noon 09/08/2019, 11:00 AM

## 2019-09-08 NOTE — Progress Notes (Signed)
Patient called the desk and requested to speak to RN. This RN spoke with patient and she reports that her left arm new IV (midline) was causing her pain. Dressing appears clean, dry, and intact, but skin does appear taunt and wrinkled under dressing. Patient was given pain pill (Norco) for 5/10 intensity pain. Patient also reports that she was told that the dressing had to be taped in that manner for the line to work properly. Patient's line was placed today, for home antibiotics.   Discussed with care nurse, Gaspar Skeeters, and IV team consult placed for reassessment of midline catheter. Will cont to monitor line.

## 2019-09-09 ENCOUNTER — Ambulatory Visit: Admit: 2019-09-09 | Payer: PPO

## 2019-09-09 ENCOUNTER — Ambulatory Visit
Admission: RE | Admit: 2019-09-09 | Discharge: 2019-09-09 | Disposition: A | Discharge status: Home / Self Care | Payer: PPO | Source: Ambulatory Visit | Attending: Infectious Diseases | Admitting: Infectious Diseases

## 2019-09-09 DIAGNOSIS — B951 Streptococcus, group B, as the cause of diseases classified elsewhere: Secondary | ICD-10-CM | POA: Insufficient documentation

## 2019-09-09 DIAGNOSIS — R7881 Bacteremia: Secondary | ICD-10-CM | POA: Insufficient documentation

## 2019-09-09 LAB — CULTURE, BLOOD (ROUTINE X 2)
Culture: NO GROWTH
Culture: NO GROWTH
Special Requests: ADEQUATE

## 2019-09-09 MED ORDER — SODIUM CHLORIDE 0.9 % IV SOLN
2.0000 g | INTRAVENOUS | Status: DC
  Administered 2019-09-09: 2 g via INTRAVENOUS
  Filled 2019-09-09: qty 20
  Filled 2019-09-09: qty 2

## 2019-09-10 ENCOUNTER — Ambulatory Visit
Admit: 2019-09-10 | Discharge: 2019-09-10 | Disposition: A | Discharge status: Home / Self Care | Payer: PPO | Source: Ambulatory Visit | Attending: Infectious Diseases | Admitting: Infectious Diseases

## 2019-09-10 DIAGNOSIS — R7881 Bacteremia: Secondary | ICD-10-CM | POA: Insufficient documentation

## 2019-09-10 DIAGNOSIS — B951 Streptococcus, group B, as the cause of diseases classified elsewhere: Secondary | ICD-10-CM | POA: Insufficient documentation

## 2019-09-10 DIAGNOSIS — L03115 Cellulitis of right lower limb: Secondary | ICD-10-CM | POA: Diagnosis not present

## 2019-09-10 MED ORDER — SODIUM CHLORIDE 0.9 % IV SOLN
2.0000 g | INTRAVENOUS | Status: DC
  Administered 2019-09-10: 2 g via INTRAVENOUS
  Filled 2019-09-10: qty 20
  Filled 2019-09-10: qty 2

## 2019-09-11 ENCOUNTER — Ambulatory Visit
Admission: RE | Admit: 2019-09-11 | Discharge: 2019-09-11 | Disposition: A | Discharge status: Home / Self Care | Payer: PPO | Source: Ambulatory Visit | Attending: Infectious Diseases | Admitting: Infectious Diseases

## 2019-09-11 ENCOUNTER — Other Ambulatory Visit: Payer: Self-pay

## 2019-09-11 DIAGNOSIS — R7881 Bacteremia: Secondary | ICD-10-CM | POA: Diagnosis not present

## 2019-09-11 MED ORDER — OXYBUTYNIN CHLORIDE ER 15 MG PO TB24: 15.0000 mg | ORAL_TABLET | Freq: Every day | ORAL | 3 refills | Status: DC

## 2019-09-11 MED ORDER — SODIUM CHLORIDE 0.9 % IV SOLN
2.0000 g | Freq: Once | INTRAVENOUS | Status: AC
  Administered 2019-09-11: 2 g via INTRAVENOUS
  Filled 2019-09-11: qty 2

## 2019-09-11 MED ORDER — SODIUM CHLORIDE 0.9 % IV SOLN: INTRAVENOUS | Status: DC

## 2019-09-12 ENCOUNTER — Ambulatory Visit
Admission: RE | Admit: 2019-09-12 | Discharge: 2019-09-12 | Disposition: A | Discharge status: Home / Self Care | Payer: PPO | Source: Ambulatory Visit | Attending: Infectious Diseases | Admitting: Infectious Diseases

## 2019-09-12 ENCOUNTER — Telehealth: Payer: Self-pay

## 2019-09-12 DIAGNOSIS — R7881 Bacteremia: Secondary | ICD-10-CM | POA: Insufficient documentation

## 2019-09-12 MED ORDER — SODIUM CHLORIDE 0.9 % IV SOLN
2.0000 g | Freq: Once | INTRAVENOUS | Status: AC
  Administered 2019-09-12: 2 g via INTRAVENOUS
  Filled 2019-09-12: qty 2

## 2019-09-12 NOTE — Telephone Encounter (Signed)
First attempt to contact patient for transition of care. No answer, busy signal. Will follow as appropriate. Tentative appointment scheduled with Dr. Caryl Bis 09/14/19 @ 11:30.

## 2019-09-12 NOTE — Telephone Encounter (Signed)
Reviewed.  Plan to see as scheduled. ?

## 2019-09-12 NOTE — Telephone Encounter (Signed)
Transition Care Management Follow-up Telephone Call  Date of discharge and from where: 09/08/19 from [facility]  How have you been since you were released from the hospital? Information received by husband, HIPAA compliant. IV antibiotic administered daily, she seems weaker about an hour after returning home. No falls since returning home. Denies SOB, fever, cough, pain, hallucinations, n/v/d. Nebulizer in use as needed. Oxygen monitored resting at 94-97. Good appetite, good water intake. Currently not wearing off loading boot, wearing medical shoe L foot. Denies worsening in L/R foot/ankle at this time.   Any questions or concerns? None at this time.   Items Reviewed:  Did the pt receive and understand the discharge instructions provided? Yes, increase activity as tolerated.   Medications obtained and verified? Yes, hold metformin, reduced cymbalta, xanax. Taking all other scheduled medications as directed.   Any new allergies since your discharge? None  Dietary orders reviewed? Yes, low sodium, heart healthy  Do you have support at home? Yes, husband  Functional Questionnaire: (I = Independent and D = Dependent) ADLs: assisted by husband   Bathing/Dressing- d- husband   Meal Prep- husband  Eating- i  Maintaining continence-   Transferring/Ambulation- d- walker  Managing Meds- husband assist   Follow up appointments reviewed:   PCP Hospital f/u appt confirmed? Scheduled to see Dr. Caryl Bis on 09/14/19 @ 11:30.  Labs due CBC/BMP.  [facility] f/u appt confirmed? Scheduled to see podiatry on 09/13/19; currently wearing medical shoe, L foot.   Are transportation arrangements needed? No  If their condition worsens, is the pt aware to call PCP or go to the Emergency Dept.? Yes  Was the patient provided with contact information for the PCP's office or ED?  Yes  Was to pt encouraged to call back with questions or concerns? Yes

## 2019-09-13 ENCOUNTER — Other Ambulatory Visit: Payer: Self-pay

## 2019-09-13 ENCOUNTER — Ambulatory Visit
Admission: RE | Admit: 2019-09-13 | Discharge: 2019-09-13 | Disposition: A | Discharge status: Home / Self Care | Payer: PPO | Source: Ambulatory Visit | Attending: Infectious Diseases | Admitting: Infectious Diseases

## 2019-09-13 DIAGNOSIS — R2689 Other abnormalities of gait and mobility: Secondary | ICD-10-CM | POA: Diagnosis not present

## 2019-09-13 DIAGNOSIS — L0390 Cellulitis, unspecified: Secondary | ICD-10-CM | POA: Insufficient documentation

## 2019-09-13 DIAGNOSIS — B351 Tinea unguium: Secondary | ICD-10-CM | POA: Diagnosis not present

## 2019-09-13 DIAGNOSIS — L97521 Non-pressure chronic ulcer of other part of left foot limited to breakdown of skin: Secondary | ICD-10-CM | POA: Diagnosis not present

## 2019-09-13 DIAGNOSIS — E1142 Type 2 diabetes mellitus with diabetic polyneuropathy: Secondary | ICD-10-CM | POA: Diagnosis not present

## 2019-09-13 MED ORDER — SODIUM CHLORIDE 0.9 % IV SOLN
2.0000 g | Freq: Once | INTRAVENOUS | Status: AC
  Administered 2019-09-13: 2 g via INTRAVENOUS
  Filled 2019-09-13: qty 20

## 2019-09-14 ENCOUNTER — Ambulatory Visit (INDEPENDENT_AMBULATORY_CARE_PROVIDER_SITE_OTHER): Payer: PPO | Admitting: Family Medicine

## 2019-09-14 ENCOUNTER — Encounter: Payer: Self-pay | Admitting: Family Medicine

## 2019-09-14 ENCOUNTER — Ambulatory Visit
Admission: RE | Admit: 2019-09-14 | Discharge: 2019-09-14 | Disposition: A | Discharge status: Home / Self Care | Payer: PPO | Source: Ambulatory Visit | Attending: Infectious Diseases | Admitting: Infectious Diseases

## 2019-09-14 ENCOUNTER — Other Ambulatory Visit: Payer: Self-pay | Admitting: Family Medicine

## 2019-09-14 VITALS — BP 130/70 | Temp 96.4°F | Ht 65.0 in | Wt 242.0 lb

## 2019-09-14 DIAGNOSIS — E875 Hyperkalemia: Secondary | ICD-10-CM | POA: Insufficient documentation

## 2019-09-14 DIAGNOSIS — R7881 Bacteremia: Secondary | ICD-10-CM | POA: Insufficient documentation

## 2019-09-14 DIAGNOSIS — L97422 Non-pressure chronic ulcer of left heel and midfoot with fat layer exposed: Secondary | ICD-10-CM | POA: Diagnosis not present

## 2019-09-14 DIAGNOSIS — I10 Essential (primary) hypertension: Secondary | ICD-10-CM | POA: Diagnosis not present

## 2019-09-14 DIAGNOSIS — G929 Unspecified toxic encephalopathy: Secondary | ICD-10-CM

## 2019-09-14 DIAGNOSIS — G92 Toxic encephalopathy: Secondary | ICD-10-CM

## 2019-09-14 DIAGNOSIS — E11621 Type 2 diabetes mellitus with foot ulcer: Secondary | ICD-10-CM | POA: Diagnosis not present

## 2019-09-14 DIAGNOSIS — J189 Pneumonia, unspecified organism: Secondary | ICD-10-CM | POA: Diagnosis not present

## 2019-09-14 DIAGNOSIS — E1151 Type 2 diabetes mellitus with diabetic peripheral angiopathy without gangrene: Secondary | ICD-10-CM

## 2019-09-14 LAB — CBC WITH DIFFERENTIAL/PLATELET
Abs Immature Granulocytes: 0.03 10*3/uL (ref 0.00–0.07)
Basophils Absolute: 0.1 10*3/uL (ref 0.0–0.1)
Basophils Relative: 1 %
Eosinophils Absolute: 0.3 10*3/uL (ref 0.0–0.5)
Eosinophils Relative: 4 %
HCT: 34.2 % — ABNORMAL LOW (ref 36.0–46.0)
Hemoglobin: 10.5 g/dL — ABNORMAL LOW (ref 12.0–15.0)
Immature Granulocytes: 1 %
Lymphocytes Relative: 17 %
Lymphs Abs: 1.1 10*3/uL (ref 0.7–4.0)
MCH: 26.3 pg (ref 26.0–34.0)
MCHC: 30.7 g/dL (ref 30.0–36.0)
MCV: 85.7 fL (ref 80.0–100.0)
Monocytes Absolute: 0.5 10*3/uL (ref 0.1–1.0)
Monocytes Relative: 8 %
Neutro Abs: 4.5 10*3/uL (ref 1.7–7.7)
Neutrophils Relative %: 69 %
Platelets: 307 10*3/uL (ref 150–400)
RBC: 3.99 MIL/uL (ref 3.87–5.11)
RDW: 15.5 % (ref 11.5–15.5)
WBC: 6.6 10*3/uL (ref 4.0–10.5)
nRBC: 0 % (ref 0.0–0.2)

## 2019-09-14 LAB — COMPREHENSIVE METABOLIC PANEL
ALT: 11 U/L (ref 0–44)
AST: 15 U/L (ref 15–41)
Albumin: 3.3 g/dL — ABNORMAL LOW (ref 3.5–5.0)
Alkaline Phosphatase: 77 U/L (ref 38–126)
Anion gap: 8 (ref 5–15)
BUN: 49 mg/dL — ABNORMAL HIGH (ref 8–23)
CO2: 25 mmol/L (ref 22–32)
Calcium: 8.8 mg/dL — ABNORMAL LOW (ref 8.9–10.3)
Chloride: 105 mmol/L (ref 98–111)
Creatinine, Ser: 1.31 mg/dL — ABNORMAL HIGH (ref 0.44–1.00)
GFR calc Af Amer: 48 mL/min — ABNORMAL LOW (ref >60)
GFR calc non Af Amer: 41 mL/min — ABNORMAL LOW (ref >60)
Glucose, Bld: 149 mg/dL — ABNORMAL HIGH (ref 70–99)
Potassium: 6.1 mmol/L — ABNORMAL HIGH (ref 3.5–5.1)
Sodium: 138 mmol/L (ref 135–145)
Total Bilirubin: 0.4 mg/dL (ref 0.3–1.2)
Total Protein: 6.7 g/dL (ref 6.5–8.1)

## 2019-09-14 LAB — BASIC METABOLIC PANEL
BUN: 46 mg/dL — ABNORMAL HIGH (ref 6–23)
CO2: 27 mEq/L (ref 19–32)
Calcium: 9.1 mg/dL (ref 8.4–10.5)
Chloride: 104 mEq/L (ref 96–112)
Creatinine, Ser: 1.27 mg/dL — ABNORMAL HIGH (ref 0.40–1.20)
GFR: 41.6 mL/min — ABNORMAL LOW (ref >60.00)
Glucose, Bld: 113 mg/dL — ABNORMAL HIGH (ref 70–99)
Potassium: 6 mEq/L — ABNORMAL HIGH (ref 3.5–5.1)
Sodium: 137 mEq/L (ref 135–145)

## 2019-09-14 MED ORDER — HYDROCHLOROTHIAZIDE 12.5 MG PO TABS: 12.5000 mg | ORAL_TABLET | Freq: Every day | ORAL | 1 refills | Status: DC

## 2019-09-14 MED ORDER — BLOOD GLUCOSE MONITOR KIT: PACK | 0 refills | Status: DC

## 2019-09-14 MED ORDER — SODIUM CHLORIDE 0.9 % IV SOLN
2.0000 g | Freq: Once | INTRAVENOUS | Status: AC
  Administered 2019-09-14: 2 g via INTRAVENOUS
  Filled 2019-09-14: qty 2

## 2019-09-14 NOTE — Progress Notes (Signed)
Tommi Rumps, MD Phone: [phone]  [name] is a 69 y.o. adult who presents today for hospital follow-up.  This patient was hospitalized from 09/02/2019-09/08/2019.  She was hospitalized for sepsis.  She was found to have a right middle lobe pneumonia as well as cellulitis related to an ulcer on her left foot.  She was admitted and treated with Rocephin and azithromycin.  She was discharged early to complete 10 days of IV antibiotics through the infusion center.  Today was her last day of antibiotics.  She notes she feels significantly better.  She has had no fevers.  Her breathing is okay.  She does have some tiredness and fatigue and typically wants to take a nap later in the day.  There was report of hallucinations for about a year or so and her Xanax and Cymbalta doses were reduced.  The patient reports that her husband saw her talking to herself in her sleep and she thinks that may have been what he was referring to.  She reports she has never had anything like a hallucination while awake.  She followed up with her podiatrist yesterday for the ulcer on her left foot.  They have her in a postop shoe with significant padding and wrapping around the foot and ankle.  She reports her blood pressure at the infusion center today was 133/80.  She is currently on Toprol, losartan, HCTZ, and hydralazine.  She had lab work done today that did reveal an elevated potassium.  She denies any palpitations.  She is requesting a new glucometer.  Discharge summary reviewed.  Medications reviewed.  Social History   Tobacco Use  Smoking Status Never Smoker  Smokeless Tobacco Never Used     ROS see history of present illness  Objective  Physical Exam Vitals:   09/14/19 1200  BP: 130/70  Temp: (!) 96.4 F (35.8 C)  SpO2: 93%    BP Readings from Last 3 Encounters:  09/14/19 130/70  09/08/19 128/81  09/01/19 138/86   Wt Readings from Last 3 Encounters:  09/14/19 242 lb (109.8 kg)   09/08/19 242 lb 4.6 oz (109.9 kg)  09/01/19 252 lb (114.3 kg)    Physical Exam Constitutional:      General: She is not in acute distress.    Appearance: She is not diaphoretic.  Cardiovascular:     Rate and Rhythm: Normal rate and regular rhythm.     Heart sounds: Normal heart sounds.  Pulmonary:     Effort: Pulmonary effort is normal.     Breath sounds: Normal breath sounds.  Musculoskeletal:     Right lower leg: No edema.     Left lower leg: No edema.     Comments: Left heel bandage was unwrapped and ulcer visible with offloading bandaging noted, there is no surrounding erythema, there is no purulent drainage  Skin:    General: Skin is warm and dry.  Neurological:     Mental Status: She is alert.      Assessment/Plan: Please see individual problem list.  CAP (community acquired pneumonia) Patient with pneumonia leading to hospitalization.  Has now recovered.  Discussed that it would likely take 3 to 4 days/day in the hospital for her to fully recover back to her baseline.  Offered physical therapy for deconditioning though she declined and opted to do home exercise.  Discussed the need for repeat chest x-ray in about 4 weeks.  She will follow up with her PCP around that time to have that  completed.  Diabetic ulcer of left foot ([place]) No signs of infection on exam today.  The foot and ankle were wrapped back up using the wrapping that was placed by podiatry yesterday.  She will keep her appointment with podiatry.  Hyperkalemia Undetermined if this is true hyperkalemia or falsely elevated related to hemolysis.  Will recheck.  Advised of symptoms that would require emergency room evaluation.  If truly elevated will need to eliminate her losartan.  Toxic encephalopathy Reported hallucinations and the discharge summary.  The patient notes that these seem to be her talking to herself while she is asleep.  She will monitor on her current doses of medicine and follow-up with her PCP  regarding this.  DM (diabetes mellitus) type II, controlled, with peripheral vascular disorder ([place]) Glucometer sent to pharmacy.  Essential hypertension Blood pressure stable.  May need to discontinue losartan if she is truly hyperkalemic.   Orders Placed This Encounter  Procedures  . Basic Metabolic Panel (BMET)    Meds ordered this encounter  Medications  . blood glucose meter kit and supplies KIT    Sig: Dispense based on patient and insurance preference. Use up to 2 times daily as directed. (FOR ICD-10 E11.9).    Dispense:  1 each    Refill:  0    Order Specific Question:   Number of strips    Answer:   100    Order Specific Question:   Number of lancets    Answer:   100    This visit occurred during the SARS-CoV-2 public health emergency.  Safety protocols were in place, including screening questions prior to the visit, additional usage of staff PPE, and extensive cleaning of exam room while observing appropriate contact time as indicated for disinfecting solutions.    Tommi Rumps, MD [place]

## 2019-09-14 NOTE — Assessment & Plan Note (Signed)
Blood pressure stable.  May need to discontinue losartan if she is truly hyperkalemic.

## 2019-09-14 NOTE — Assessment & Plan Note (Signed)
No signs of infection on exam today.  The foot and ankle were wrapped back up using the wrapping that was placed by podiatry yesterday.  She will keep her appointment with podiatry.

## 2019-09-14 NOTE — Assessment & Plan Note (Signed)
Reported hallucinations and the discharge summary.  The patient notes that these seem to be her talking to herself while she is asleep.  She will monitor on her current doses of medicine and follow-up with her PCP regarding this.

## 2019-09-14 NOTE — Assessment & Plan Note (Signed)
Glucometer sent to pharmacy.

## 2019-09-14 NOTE — Patient Instructions (Signed)
Nice to see you. We will check labs today.  Please work on increasing your strength and if you would like to do physical therapy please let us know. If you develop shortness of breath, fevers, chest pain, or any new or change in symptoms please seek medical attention.

## 2019-09-14 NOTE — Assessment & Plan Note (Signed)
Undetermined if this is true hyperkalemia or falsely elevated related to hemolysis.  Will recheck.  Advised of symptoms that would require emergency room evaluation.  If truly elevated will need to eliminate her losartan.

## 2019-09-14 NOTE — Assessment & Plan Note (Signed)
Patient with pneumonia leading to hospitalization.  Has now recovered.  Discussed that it would likely take 3 to 4 days/day in the hospital for her to fully recover back to her baseline.  Offered physical therapy for deconditioning though she declined and opted to do home exercise.  Discussed the need for repeat chest x-ray in about 4 weeks.  She will follow up with her PCP around that time to have that completed.

## 2019-09-15 ENCOUNTER — Telehealth: Payer: Self-pay | Admitting: Internal Medicine

## 2019-09-15 ENCOUNTER — Other Ambulatory Visit: Payer: Self-pay | Admitting: Family Medicine

## 2019-09-15 ENCOUNTER — Telehealth: Payer: Self-pay

## 2019-09-15 ENCOUNTER — Other Ambulatory Visit: Payer: Self-pay

## 2019-09-15 DIAGNOSIS — S76012A Strain of muscle, fascia and tendon of left hip, initial encounter: Secondary | ICD-10-CM

## 2019-09-15 NOTE — Telephone Encounter (Signed)
The patient called stating that Dr. Caryl Bis sent in a prescription for a Glucometer and a [facility] order for a Toilet seat riser.

## 2019-09-15 NOTE — Telephone Encounter (Signed)
Called and spoke with the patient and informed her that I sent the meter rx to [facility] and the riser RX to [facility].  She understood.  [name],cma

## 2019-09-15 NOTE — Progress Notes (Signed)
Dr.Sonnenberg repeating K and stopping losartan if still high. Pt has completed IV ceftriaxone for the rt leg cellulitis

## 2019-09-15 NOTE — Telephone Encounter (Signed)
I called and spoke with the patient and informed her that I sent the meter Rx to [facility] and I sent the order that was signed by Dr. Nicki Reaper for her toilet riser to [facility].  Ansley Mangiapane,cma

## 2019-09-18 ENCOUNTER — Ambulatory Visit: Payer: PPO | Admitting: Urology

## 2019-09-18 ENCOUNTER — Other Ambulatory Visit: Payer: Self-pay

## 2019-09-18 ENCOUNTER — Other Ambulatory Visit: Payer: PPO

## 2019-09-18 VITALS — BP 128/78 | HR 73

## 2019-09-18 DIAGNOSIS — N3946 Mixed incontinence: Secondary | ICD-10-CM | POA: Diagnosis not present

## 2019-09-18 MED ORDER — FLUCONAZOLE 100 MG PO TABS
100.0000 mg | ORAL_TABLET | Freq: Every day | ORAL | 0 refills | Status: DC
Start: 2019-09-18 — End: 2019-10-31

## 2019-09-18 NOTE — Progress Notes (Signed)
09/18/2019 1:37 PM   [name] 02/23/1951 [id number]  Referring provider: [name], [name], MD [place],  [place] [address]  Chief Complaint  Patient presents with  . Urinary Incontinence    HPI: The patient has been followed in clinic and had failed Myrbetriq.  She was a partial responder to oxybutynin and last time I added Toviaz.  She is at least 50% better.  She still strains to urinate.    She has mixed incontinence and mild bedwetting.  She has moderate stress incontinence by urodynamics and history.  She has foot on the floor syndrome.  She is getting recurrent bladder infections and was given fosfomycin in the last several months for resistant positive culture  On urodynamics she had a stable bladder at a capacity of 800 mL and her Valsalva leak point pressure range between 38 and 80 cm of water  She is on a blood thinner and has a lot of medical issues and is obese.  I reviewed mixed incontinence and she primarily has an overactive bladder.  I am very concerned and I do not think she did have a sling based on the mixed component her medical issues blood thinners and she strains to urinate.  I do not think urethral injectable is ideal but always could be entertained but she does have a complicated presentation.  I went over percutaneous tibial nerve stimulation and she put on hold for now.  I think we need to have reasonable treatment goals  Reassess on double therapy in 4 months  Today Frequency stable.  Patient has seen nurse practitioner with positive cultures over the last year.  She has been given fosfomycin and has been asked to be assessed by infectious disease  Patient was here June 11 still incontinent on oxybutynin and Toviaz.  She currently has bedwetting high-volume.  She has a lot of medical comorbidities and is on CPAP.  She is on local estrogen cream and cranberry juice and probiotics irregularly.  She was offered percutaneous  tibial nerve stimulation.  She was switched to trospium twice a day based on conversation with the patient's choice.  When she became septic in June with a positive blood culture her urine culture was negative.  Was felt she may have had pneumonia.  Infectious disease was consulted.  She had an echocardiogram that was negative looking for source of infection.  She has been having wound issues in the lower leg which may or may not be related.  There was documentation having issues with hallucinations.  Have limited treatment options.  I went over percutaneous tibial nerve stimulation with her.  Certainly we would not be offering a sling for this mixed picture.  I do not think she should have a bulking agent either.  It I do not think is going to help the flooding bedwetting  Medically not infected today.  Call if urine culture is positive.  She never started to trospium she will take it with the [place].  She will call when she wants to start percutaneous she will nerve stimulation.  I think she has had a number of these conversations with myself and nurse practitioners.  I am not sure if were going to be able to reach her treatment goal.  She has a lot of medical comorbidities as well and unfortunately may not have a great solution for her.  We will be treating infections as they come   PMH: Past Medical History:  Diagnosis Date  .  Abnormal antibody titer   . Anxiety and depression   . Arthritis   . Asthma   . Cystocele   . Depression   . Diabetes ([place])   . DVT (deep venous thrombosis) (HCC)    Righ calf  . Dyspnea    11/08/2018 - "more now due to lack of exercise"  . Dysrhythmia    afib  . Edema   . GERD (gastroesophageal reflux disease)   . Heart murmur    Echocardiogram June 2015: Mild MR (possible vegetation seen on TTE, not seen on TEE), normal LV size with moderate concentric LVH. Normal function EF 60-65%. Normal diastolic function. Mild LA dilation.  Marland Kitchen History of IBS   . History  of kidney stones   . History of methicillin resistant staphylococcus aureus (MRSA) 2008  . Hyperlipidemia   . Hypertension   . Hypothyroidism   . Insomnia   . Kidney stone    kidney stones with lithotripsy .  [facility]- "adrenal glands"  . Neuropathy involving both lower extremities   . Obesity, Class II, BMI 35-39.9, with comorbidity   . Paroxysmal atrial fibrillation ([place]) 2007   In June 2015, Cardiac Event Monitor: Mostly SR/sinus arrhythmia with PVCs that are frequent. Short bursts of A. fib lasting several minutes;; CHA2DS2-VASc Score = 5 (age, Female, PVD, DM, HTN)  . Peripheral vascular disease ([facility])   . Sleep apnea    use C-PAP  . Urinary incontinence   . Venous stasis dermatitis of both lower extremities     Surgical History: Past Surgical History:  Procedure Laterality Date  . ANKLE SURGERY Right   . APPLICATION OF WOUND VAC Left 06/27/2015   Procedure: APPLICATION OF WOUND VAC ( POSSIBLE ) ;  Surgeon: [name], MD;  Location: [facility] ORS;  Service: Vascular;  Laterality: Left;  . APPLICATION OF WOUND VAC Left 09/25/2016   Procedure: APPLICATION OF WOUND VAC;  Surgeon: [name], DPM;  Location: [facility] ORS;  Service: Podiatry;  Laterality: Left;  . arm surgery     right    . CHOLECYSTECTOMY    . COLONOSCOPY    . COLONOSCOPY WITH PROPOFOL N/A 01/11/2017   Procedure: COLONOSCOPY WITH PROPOFOL;  Surgeon: [name], MD;  Location: [facility] ENDOSCOPY;  Service: Endoscopy;  Laterality: N/A;  . COLONOSCOPY WITH PROPOFOL N/A 02/22/2017   Procedure: COLONOSCOPY WITH PROPOFOL;  Surgeon: [name], MD;  Location: [facility] ENDOSCOPY;  Service: Endoscopy;  Laterality: N/A;  . COLONOSCOPY WITH PROPOFOL N/A 05/31/2017   Procedure: COLONOSCOPY WITH PROPOFOL;  Surgeon: [name], MD;  Location: [facility] ENDOSCOPY;  Service: Endoscopy;  Laterality: N/A;  . ESOPHAGOGASTRODUODENOSCOPY (EGD) WITH PROPOFOL N/A 01/11/2017   Procedure: ESOPHAGOGASTRODUODENOSCOPY (EGD) WITH  PROPOFOL;  Surgeon: [name], MD;  Location: [facility] ENDOSCOPY;  Service: Endoscopy;  Laterality: N/A;  . ESOPHAGOGASTRODUODENOSCOPY (EGD) WITH PROPOFOL N/A 10/25/2018   Procedure: ESOPHAGOGASTRODUODENOSCOPY (EGD) WITH PROPOFOL;  Surgeon: [name], MD;  Location: [facility] ENDOSCOPY;  Service: Endoscopy;  Laterality: N/A;  . HERNIA REPAIR     umbilical  . HIATAL HERNIA REPAIR    . I & D EXTREMITY Left 06/27/2015   Procedure: IRRIGATION AND DEBRIDEMENT EXTREMITY            ( CALF HEMATOMA ) POSSIBLE WOUND VAC;  Surgeon: [name], MD;  Location: [facility] ORS;  Service: Vascular;  Laterality: Left;  . INCISION AND DRAINAGE OF WOUND Left 09/25/2016   Procedure: IRRIGATION AND DEBRIDEMENT - PARTIAL RESECTION OF ACHILLES TENDON  WITH WOUND VAC APPLICATION;  Surgeon: [name], DPM;  Location: [facility] ORS;  Service: Podiatry;  Laterality: Left;  . INCISION AND DRAINAGE OF WOUND Left 11/09/2018   Procedure: Excision of left foot wound with ACell placement;  Surgeon: Wallace Going, DO;  Location: [facility];  Service: Plastics;  Laterality: Left;  . IRRIGATION AND DEBRIDEMENT ABSCESS Left 09/07/2016   Procedure: IRRIGATION AND DEBRIDEMENT ABSCESS LEFT HEEL;  Surgeon: [name], DPM;  Location: [facility] ORS;  Service: Podiatry;  Laterality: Left;  . JOINT REPLACEMENT  2013   left knee replacement  . KIDNEY SURGERY Right    kidney stones  . LITHOTRIPSY    . LOWER EXTREMITY ANGIOGRAPHY Left 04/04/2018   Procedure: LOWER EXTREMITY ANGIOGRAPHY;  Surgeon: [name], MD;  Location: [facility] CV LAB;  Service: Cardiovascular;  Laterality: Left;  . NM GATED MYOVIEW ([facility])  February 2017   Likely breast attenuation. LOW RISK study. Normal EF 55-60%.  . OTHER SURGICAL HISTORY     08/2016 or 09/2016 surgery on achilles tenso h/o staph infection heal. Dr. [name]  . removal of left hematoma Left    leg  . REVERSE SHOULDER ARTHROPLASTY Right 10/08/2015   Procedure: REVERSE SHOULDER  ARTHROPLASTY;  Surgeon: [name], MD;  Location: [facility] ORS;  Service: Orthopedics;  Laterality: Right;  . SLEEVE GASTROPLASTY    . TONSILLECTOMY    . TOTAL KNEE ARTHROPLASTY Left   . TOTAL SHOULDER REPLACEMENT Right   . TRANSESOPHAGEAL ECHOCARDIOGRAM  08/08/2013   Mild LVH, EF 60-65%. Moderate LA dilation and mild RA dilation. Mild MR with no evidence of stenosis and no evidence of endocarditis. A false chordae is noted.  . TRANSTHORACIC ECHOCARDIOGRAM  08/03/2013   Mild-Moderate concentric LVH, EF 60-65%. Normal diastolic function. Mild LA dilation. Mild MR with possible vegetation  - not confirmed on TEE   . ULNAR NERVE TRANSPOSITION Right 08/06/2016   Procedure: ULNAR NERVE DECOMPRESSION/TRANSPOSITION;  Surgeon: [name], MD;  Location: [facility] ORS;  Service: Orthopedics;  Laterality: Right;  . UPPER GI ENDOSCOPY    . VAGINAL HYSTERECTOMY      Home Medications:  Allergies as of 09/18/2019      Reactions   Morphine And Related Other (See Comments), Nausea Only   Patient becomes very confused Other reaction(s): Other (See Comments), Other (See Comments) Patient becomes very confused Patient becomes very confused   Penicillins Hives, Shortness Of Breath, Swelling, Other (See Comments)   Facial swelling Has patient had a PCN reaction causing immediate rash, facial/tongue/throat swelling, SOB or lightheadedness with hypotension: Yes Has patient had a PCN reaction causing severe rash involving mucus membranes or skin necrosis: Yes Has patient had a PCN reaction that required hospitalization No Has patient had a PCN reaction occurring within the last 10 years: No If all of the above answers are "NO", then may proceed with Cephalosporin use. Other reaction(s): Other (See Comments), Other (See Comments) Facial swelling Has patient had a PCN reaction causing immediate rash, facial/tongue/throat swelling, SOB or lightheadedness with hypotension: Yes Has patient had a PCN reaction causing  severe rash involving mucus membranes or skin necrosis: Yes Has patient had a PCN reaction that required hospitalization No Has patient had a PCN reaction occurring within the last 10 years: No If all of the above answers are "NO", then may proceed with Cephalosporin use. Facial swelling Has patient had a PCN reaction causing immediate rash, facial/tongue/throat swelling, SOB or lightheadedness with hypotension: Yes Has patient had a PCN  reaction causing severe rash involving mucus membranes or skin necrosis: Yes Has patient had a PCN reaction that required hospitalization No Has patient had a PCN reaction occurring within the last 10 years: No If all of the above answers are "NO", then may proceed with Cephalosporin use. Facial swelling Has patient had a PCN reaction causing immediate rash, facial/tongue/throat swelling, SOB or lightheadedness with hypotension: Yes Has patient had a PCN reaction causing severe rash involving mucus membranes or skin necrosis: Yes Has patient had a PCN reaction that required hospitalization No Has patient had a PCN reaction occurring within the last 10 years: No If all of the above answers are "NO", then may proceed with Cephalosporin use.   Ambien [zolpidem]    Hallucination    Aspirin Hives   Oxytetracycline Hives   Other reaction(s): Unknown      Medication List       Accurate as of September 18, 2019  1:37 PM. If you have any questions, ask your nurse or doctor.        acetaminophen 325 MG tablet Commonly known as: TYLENOL Take 2 tablets (650 mg total) by mouth every 6 (six) hours as needed for mild pain or moderate pain (or Fever >/= 101).   Advair HFA 115-21 MCG/ACT inhaler Generic drug: fluticasone-salmeterol Inhale 2 puffs into the lungs daily. Rinse mouth thoroughly after use   albuterol (2.5 MG/3ML) 0.083% nebulizer solution Commonly known as: PROVENTIL Take 3 mLs (2.5 mg total) by nebulization every 6 (six) hours as needed for wheezing or  shortness of breath. What changed: Another medication with the same name was changed. Make sure you understand how and when to take each.   albuterol 108 (90 Base) MCG/ACT inhaler Commonly known as: VENTOLIN HFA Inhale 1-2 puffs into the lungs every 4 (four) hours as needed for wheezing or shortness of breath. What changed: how much to take   ALPRAZolam 0.5 MG tablet Commonly known as: XANAX Take 0.5 tablets (0.25 mg total) by mouth 2 (two) times daily as needed for anxiety.   blood glucose meter kit and supplies Kit Dispense based on patient and insurance preference. Use up to 2 times daily as directed. (FOR ICD-10 E11.9).   buPROPion 150 MG 24 hr tablet Commonly known as: WELLBUTRIN XL Take 1 tablet (150 mg total) by mouth daily.   colchicine 0.6 MG tablet Take by mouth.   DULoxetine HCl 40 MG Cpep Take 40 mg by mouth daily.   DULoxetine 20 MG capsule Commonly known as: CYMBALTA Take 40 mg by mouth daily.   estradiol 0.1 MG/GM vaginal cream Commonly known as: ESTRACE Place 1 Applicatorful vaginally every Monday, Wednesday, and Friday. Apply 0.46m (pea-sized amount)  just inside the vaginal introitus with a finger-tip on Monday, Wednesday and Friday nights,   hydrALAZINE 25 MG tablet Commonly known as: APRESOLINE Take 1 tablet (25 mg total) by mouth 2 (two) times daily.   hydrochlorothiazide 12.5 MG tablet Commonly known as: HYDRODIURIL Take 1 tablet (12.5 mg total) by mouth daily.   HYDROcodone-acetaminophen 5-325 MG tablet Commonly known as: NORCO/VICODIN Take 1 tablet by mouth every 8 (eight) hours as needed for severe pain. Must last 30 days   levocetirizine 5 MG tablet Commonly known as: XYZAL Take 0.5 tablets (2.5 mg total) by mouth every evening.   levothyroxine 175 MCG tablet Commonly known as: SYNTHROID Take 1 tablet (175 mcg total) by mouth daily before breakfast.   metFORMIN 1000 MG tablet Commonly known as: GLUCOPHAGE Take 1 tablet (  1,000 mg total)  by mouth 2 (two) times daily. With food   metoprolol succinate 50 MG 24 hr tablet Commonly known as: TOPROL-XL TAKE 1 TABLET BY MOUTH DAILY   montelukast 10 MG tablet Commonly known as: SINGULAIR Take 1 tablet (10 mg total) by mouth at bedtime.   mupirocin ointment 2 % Commonly known as: Bactroban Apply 1 application topically 2 (two) times daily. Skin wounds   nystatin cream Commonly known as: MYCOSTATIN Apply 1 application topically 2 (two) times daily. Mouth prn for 7 days as needed What changed:   when to take this  reasons to take this   ondansetron 4 MG tablet Commonly known as: Zofran Take 1 tablet (4 mg total) by mouth every 8 (eight) hours as needed for nausea or vomiting.   OneTouch Delica Plus [id number] Misc TEST UP TO TWICE DAILY   OneTouch Ultra test strip Generic drug: glucose blood TEST UP TO TWICE DAILY   oxybutynin 15 MG 24 hr tablet Commonly known as: DITROPAN XL Take 1 tablet (15 mg total) by mouth at bedtime.   pantoprazole 40 MG tablet Commonly known as: PROTONIX Take by mouth.   Pradaxa 150 MG Caps capsule Generic drug: dabigatran TAKE 1 CAPSULE TWICE DAILY   pregabalin 50 MG capsule Commonly known as: LYRICA Take 1 capsule (50 mg total) by mouth 3 (three) times daily.   Toviaz 8 MG Tb24 tablet Generic drug: fesoterodine TAKE ONE TABLET BY MOUTH EVERY DAY   trospium 20 MG tablet Commonly known as: SANCTURA Take 1 tablet (20 mg total) by mouth 2 (two) times daily.   Vitamin D (Ergocalciferol) 1.25 MG (50000 UNIT) Caps capsule Commonly known as: DRISDOL Take 1 capsule (50,000 Units total) by mouth once a week.   zinc oxide 20 % ointment Apply 1 application topically as needed for irritation.       Allergies:  Allergies  Allergen Reactions  . Morphine And Related Other (See Comments) and Nausea Only    Patient becomes very confused Other reaction(s): Other (See Comments), Other (See Comments) Patient becomes very  confused Patient becomes very confused   . Penicillins Hives, Shortness Of Breath, Swelling and Other (See Comments)    Facial swelling Has patient had a PCN reaction causing immediate rash, facial/tongue/throat swelling, SOB or lightheadedness with hypotension: Yes Has patient had a PCN reaction causing severe rash involving mucus membranes or skin necrosis: Yes Has patient had a PCN reaction that required hospitalization No Has patient had a PCN reaction occurring within the last 10 years: No If all of the above answers are "NO", then may proceed with Cephalosporin use.  Other reaction(s): Other (See Comments), Other (See Comments) Facial swelling Has patient had a PCN reaction causing immediate rash, facial/tongue/throat swelling, SOB or lightheadedness with hypotension: Yes Has patient had a PCN reaction causing severe rash involving mucus membranes or skin necrosis: Yes Has patient had a PCN reaction that required hospitalization No Has patient had a PCN reaction occurring within the last 10 years: No If all of the above answers are "NO", then may proceed with Cephalosporin use. Facial swelling Has patient had a PCN reaction causing immediate rash, facial/tongue/throat swelling, SOB or lightheadedness with hypotension: Yes Has patient had a PCN reaction causing severe rash involving mucus membranes or skin necrosis: Yes Has patient had a PCN reaction that required hospitalization No Has patient had a PCN reaction occurring within the last 10 years: No If all of the above answers are "NO", then may proceed  with Cephalosporin use. Facial swelling Has patient had a PCN reaction causing immediate rash, facial/tongue/throat swelling, SOB or lightheadedness with hypotension: Yes Has patient had a PCN reaction causing severe rash involving mucus membranes or skin necrosis: Yes Has patient had a PCN reaction that required hospitalization No Has patient had a PCN reaction occurring within the  last 10 years: No If all of the above answers are "NO", then may proceed with Cephalosporin use.   . Ambien [Zolpidem]     Hallucination   . Aspirin Hives  . Oxytetracycline Hives    Other reaction(s): Unknown     Family History: Family History  Problem Relation Age of Onset  . Skin cancer Father   . Diabetes Father   . Hypertension Father   . Peripheral vascular disease Father   . Cancer Father   . Cerebral aneurysm Father   . Alcohol abuse Father   . Varicose Veins Mother   . Kidney disease Mother   . Arthritis Mother   . Mental illness Sister   . Cancer Maternal Aunt        breast  . Breast cancer Maternal Aunt   . Arthritis Maternal Grandmother   . Hypertension Maternal Grandmother   . Diabetes Maternal Grandmother   . Arthritis Maternal Grandfather   . Heart disease Maternal Grandfather   . Hypertension Maternal Grandfather   . Arthritis Paternal Grandmother   . Hypertension Paternal Grandmother   . Diabetes Paternal Grandmother   . Arthritis Paternal Grandfather   . Hypertension Paternal Grandfather   . Bladder Cancer Neg Hx   . Kidney cancer Neg Hx     Social History:  reports that she has never smoked. She has never used smokeless tobacco. She reports that she does not drink alcohol and does not use drugs.  ROS:                                        Physical Exam: There were no vitals taken for this visit.  Constitutional:  Alert and oriented, No acute distress.   Laboratory Data: Lab Results  Component Value Date   WBC 6.6 09/14/2019   HGB 10.5 (L) 09/14/2019   HCT 34.2 (L) 09/14/2019   MCV 85.7 09/14/2019   PLT 307 09/14/2019    Lab Results  Component Value Date   CREATININE 1.27 (H) 09/14/2019    No results found for: PSA  No results found for: TESTOSTERONE  Lab Results  Component Value Date   HGBA1C 6.8 (H) 09/02/2019    Urinalysis    Component Value Date/Time   COLORURINE YELLOW (A) 09/03/2019 1307    APPEARANCEUR HAZY (A) 09/03/2019 1307   APPEARANCEUR Cloudy (A) 09/01/2019 1420   LABSPEC 1.019 09/03/2019 1307   PHURINE 5.0 09/03/2019 1307   GLUCOSEU 50 (A) 09/03/2019 1307   GLUCOSEU NEGATIVE 08/02/2017 1427   HGBUR NEGATIVE 09/03/2019 1307   BILIRUBINUR NEGATIVE 09/03/2019 1307   BILIRUBINUR Negative 09/01/2019 1420   BILIRUBINUR 1+ 09/15/2017 1319   KETONESUR NEGATIVE 09/03/2019 1307   PROTEINUR NEGATIVE 09/03/2019 1307   UROBILINOGEN 0.2 09/15/2017 1319   UROBILINOGEN 0.2 08/02/2017 1427   NITRITE NEGATIVE 09/03/2019 1307   LEUKOCYTESUR NEGATIVE 09/03/2019 1307    Pertinent Imaging:   Assessment & Plan: As noted above; he has clinically developed a yeast infection from antibiotics in the hospital I called in Diflucan 100 mg daily for 3  days  There are no diagnoses linked to this encounter.  No follow-ups on file.  Reece Packer, MD  [facility] [address], [place], [place] [address] [phone]

## 2019-09-19 LAB — URINALYSIS, COMPLETE
Bilirubin, UA: NEGATIVE
Glucose, UA: NEGATIVE
Ketones, UA: NEGATIVE
Nitrite, UA: NEGATIVE
Protein,UA: NEGATIVE
RBC, UA: NEGATIVE
Specific Gravity, UA: 1.02 (ref 1.005–1.030)
Urobilinogen, Ur: 0.2 mg/dL (ref 0.2–1.0)
pH, UA: 5 (ref 5.0–7.5)

## 2019-09-19 LAB — MICROSCOPIC EXAMINATION: Bacteria, UA: NONE SEEN

## 2019-09-20 DIAGNOSIS — L97521 Non-pressure chronic ulcer of other part of left foot limited to breakdown of skin: Secondary | ICD-10-CM | POA: Diagnosis not present

## 2019-09-20 DIAGNOSIS — E1142 Type 2 diabetes mellitus with diabetic polyneuropathy: Secondary | ICD-10-CM | POA: Diagnosis not present

## 2019-09-21 LAB — CULTURE, URINE COMPREHENSIVE

## 2019-10-02 ENCOUNTER — Other Ambulatory Visit: Payer: Self-pay | Admitting: Cardiovascular Disease

## 2019-10-02 DIAGNOSIS — I4891 Unspecified atrial fibrillation: Secondary | ICD-10-CM

## 2019-10-03 NOTE — Telephone Encounter (Signed)
Please schedule F/U appointment with Dr. Fletcher Anon. Thank you!

## 2019-10-04 DIAGNOSIS — E1142 Type 2 diabetes mellitus with diabetic polyneuropathy: Secondary | ICD-10-CM | POA: Diagnosis not present

## 2019-10-04 DIAGNOSIS — L97521 Non-pressure chronic ulcer of other part of left foot limited to breakdown of skin: Secondary | ICD-10-CM | POA: Diagnosis not present

## 2019-10-05 NOTE — Telephone Encounter (Signed)
LMOV  

## 2019-10-12 ENCOUNTER — Ambulatory Visit: Payer: PPO | Admitting: Internal Medicine

## 2019-10-18 DIAGNOSIS — L97521 Non-pressure chronic ulcer of other part of left foot limited to breakdown of skin: Secondary | ICD-10-CM | POA: Diagnosis not present

## 2019-10-18 DIAGNOSIS — E1142 Type 2 diabetes mellitus with diabetic polyneuropathy: Secondary | ICD-10-CM | POA: Diagnosis not present

## 2019-10-20 ENCOUNTER — Ambulatory Visit: Payer: PPO | Admitting: Internal Medicine

## 2019-10-25 ENCOUNTER — Telehealth: Payer: Self-pay | Admitting: Internal Medicine

## 2019-10-25 ENCOUNTER — Telehealth: Payer: PPO | Admitting: Internal Medicine

## 2019-10-25 NOTE — Telephone Encounter (Signed)
Left message to return call stating that if we do not hear from Patient by 2:15 we will have to reschedule.

## 2019-10-25 NOTE — Telephone Encounter (Signed)
Patient no-showed today's appointment; appointment was for 8/04 at 2:00 pm, provider notified for review of record. Mychart sent to reschedule.

## 2019-10-25 NOTE — Telephone Encounter (Signed)
Left message to return call to start patient's 2:00 pm video visit.

## 2019-10-27 ENCOUNTER — Other Ambulatory Visit: Payer: Self-pay | Admitting: Internal Medicine

## 2019-10-27 DIAGNOSIS — F419 Anxiety disorder, unspecified: Secondary | ICD-10-CM

## 2019-10-27 IMAGING — MG MM DIGITAL SCREENING BILAT W/ TOMO W/ CAD
8 of 14 series · 8 of 40 positions shown · non-contrast
Comparison: Previous exam(s).

ACR Breast Density Category a: The breast tissue is almost entirely
fatty.

CLINICAL DATA: Screening.

EXAM:
DIGITAL SCREENING BILATERAL MAMMOGRAM WITH TOMO AND CAD

[L CC synth-2D (1 of 3)]
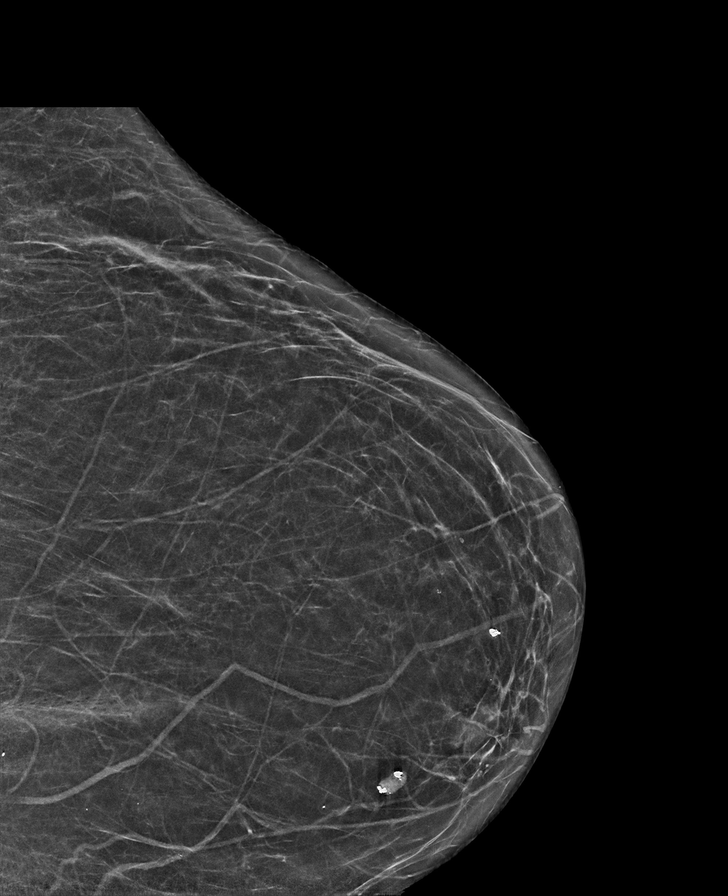

[R MLO synth-2D]
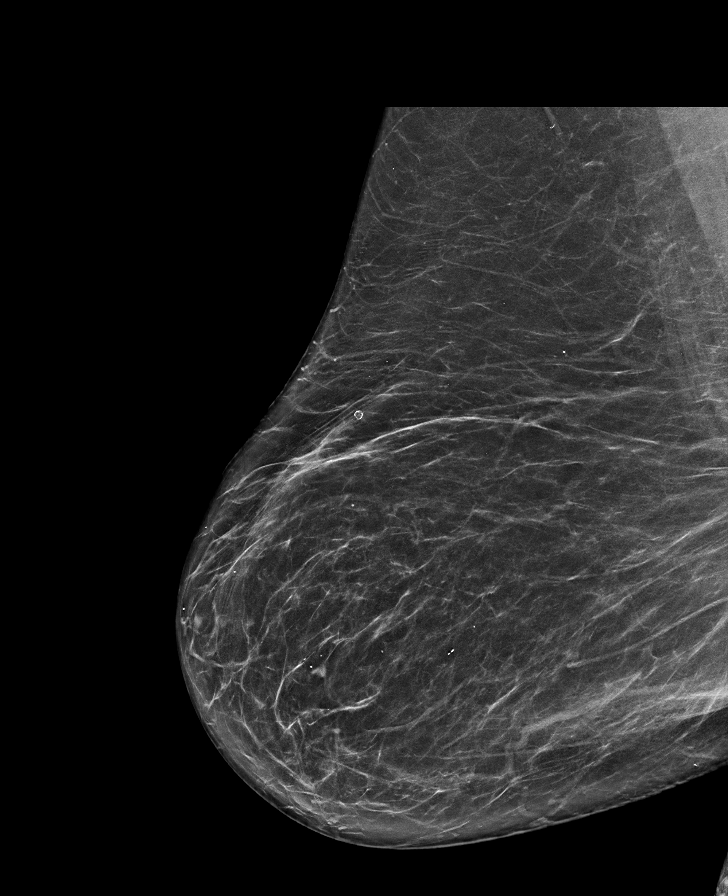

[L MLO synth-2D]
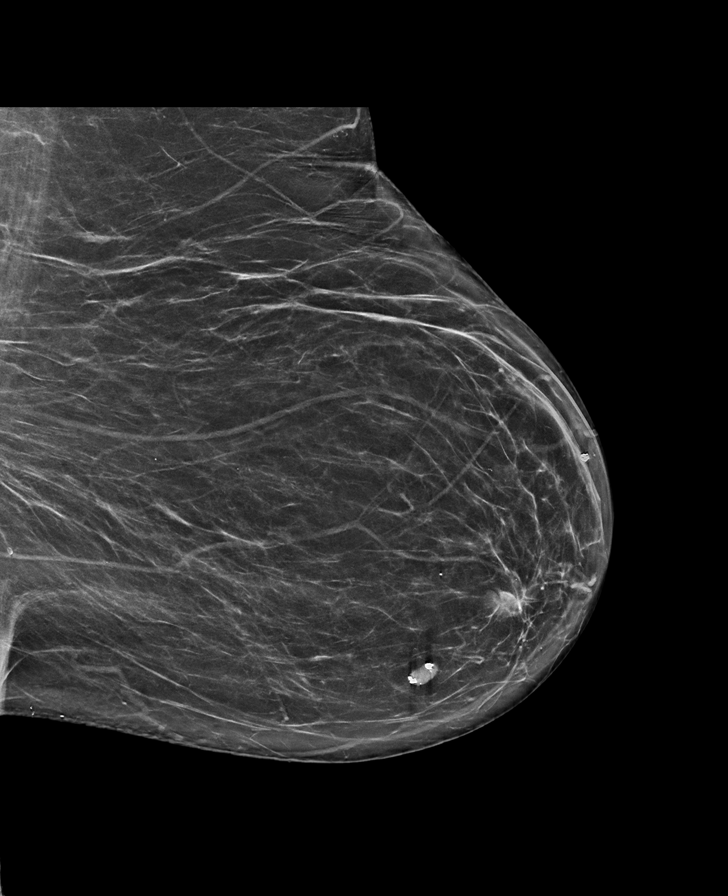

[R CC synth-2D (1 of 2)]
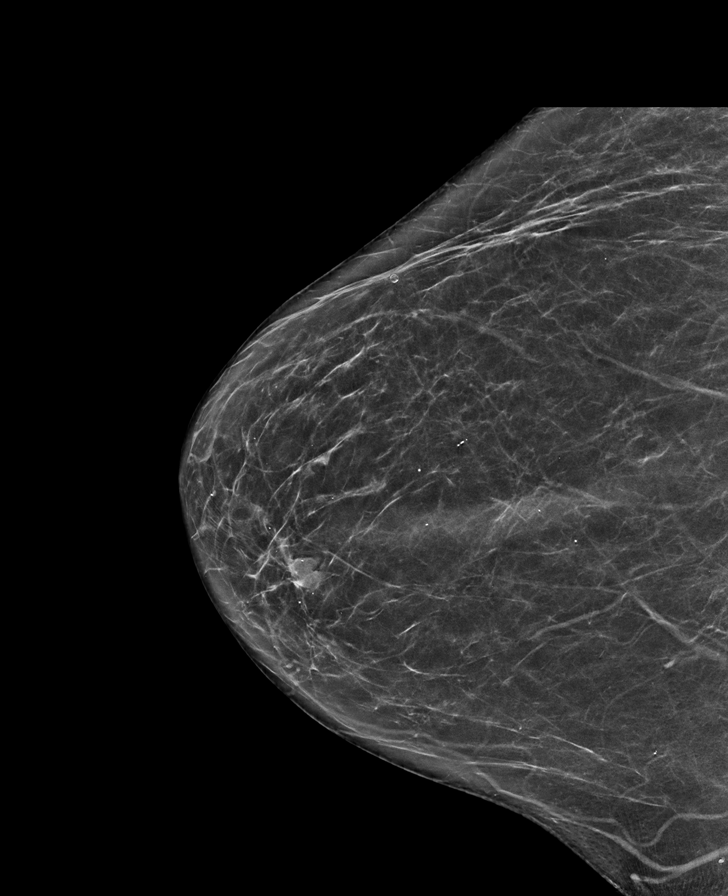

[L CC synth-2D (2 of 3)]
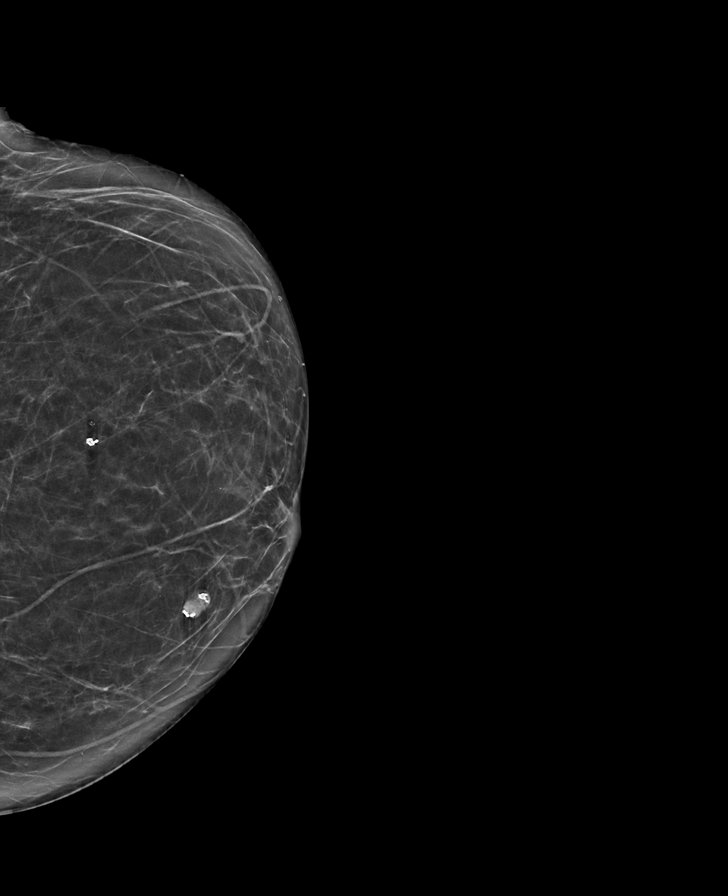

[L CC synth-2D (3 of 3)]
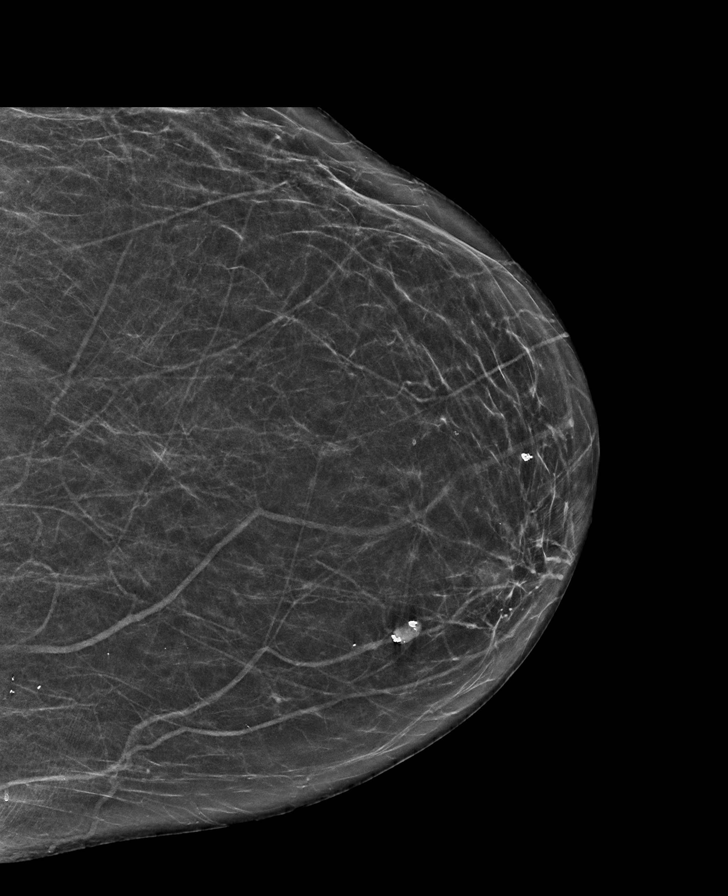

[R CC synth-2D (2 of 2)]
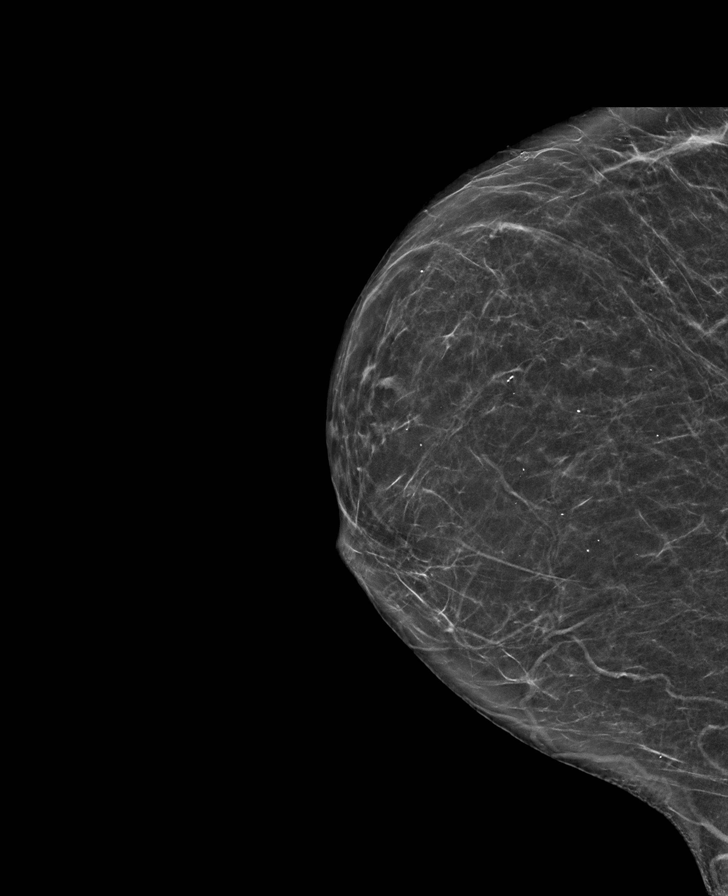

[R CC tomo · tomo slice 36/71.0]
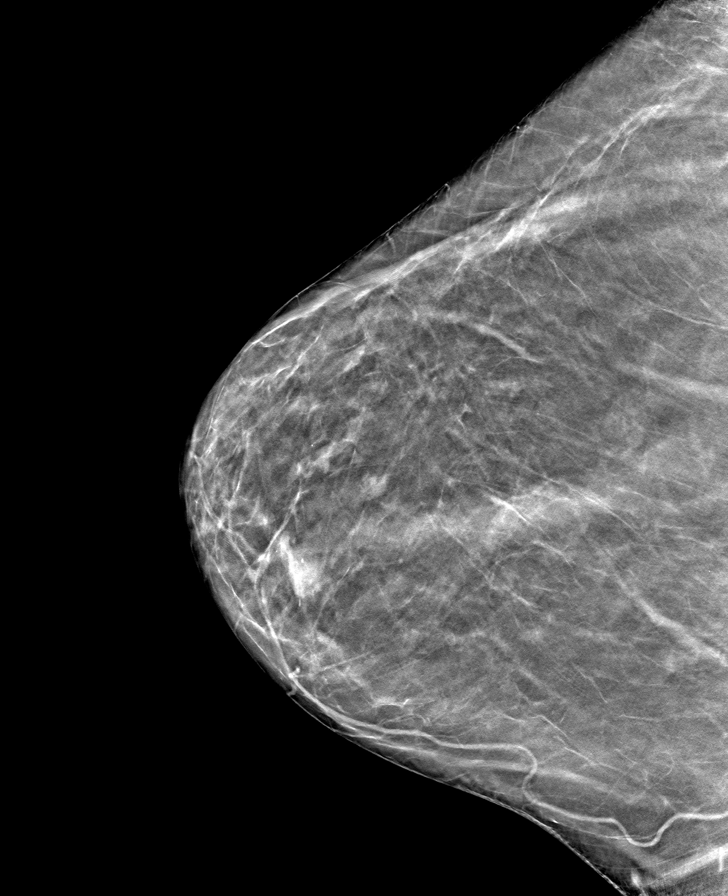

[8 of 40 positions shown; findings below may reference images not displayed]

FINDINGS: There are no findings suspicious for malignancy. Images were
processed with CAD.
IMPRESSION: No mammographic evidence of malignancy. A result letter of this
screening mammogram will be mailed directly to the patient.

RECOMMENDATION:
Screening mammogram in one year. (Code:[id number])

BI-RADS CATEGORY  1: Negative.

## 2019-10-27 MED ORDER — ALPRAZOLAM 0.5 MG PO TABS: 0.2500 mg | ORAL_TABLET | Freq: Two times a day (BID) | ORAL | 2 refills | Status: DC | PRN

## 2019-10-30 ENCOUNTER — Telehealth: Payer: Self-pay | Admitting: Internal Medicine

## 2019-10-30 ENCOUNTER — Telehealth: Payer: Self-pay | Admitting: Cardiovascular Disease

## 2019-10-30 IMAGING — CT CT ABDOMEN W/O CM
2 of 4 series · 15 of 46 positions shown, 17 images · non-contrast
Comparison: CT scan of August 23, 2012.  MRI December 14, 2014.

CLINICAL DATA: Adrenal mass.

EXAM:
CT ABDOMEN WITHOUT CONTRAST
TECHNIQUE: Multidetector CT imaging of the abdomen was performed following the
standard protocol without IV contrast.

[Series 2: axial st adrenals · axial · 0.86mm/px · z∈[-1432,-1168]mm · 12 of 154 slices shown, 14 images]
[im 11/154  soft-tissue]
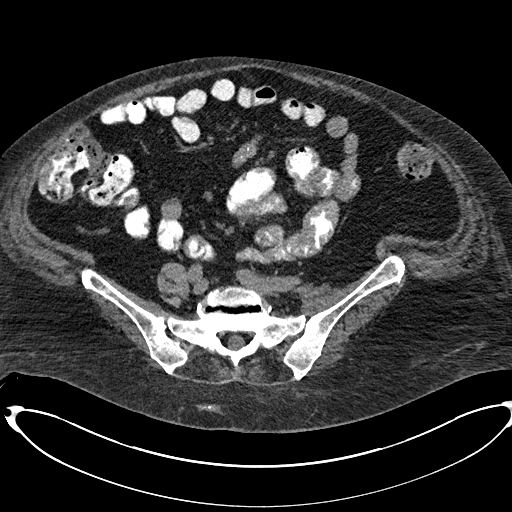
[im 11/154  bone]
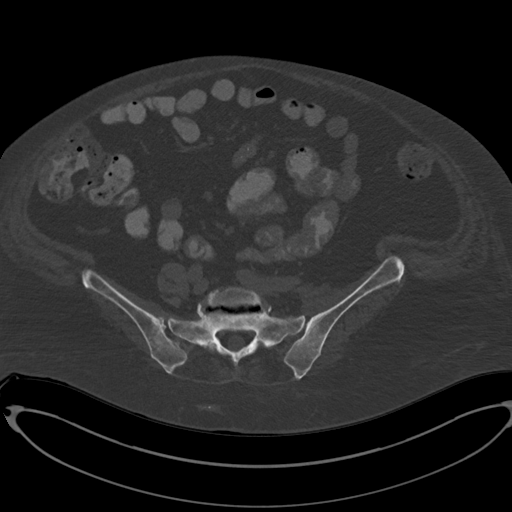
[im 21/154  soft-tissue]
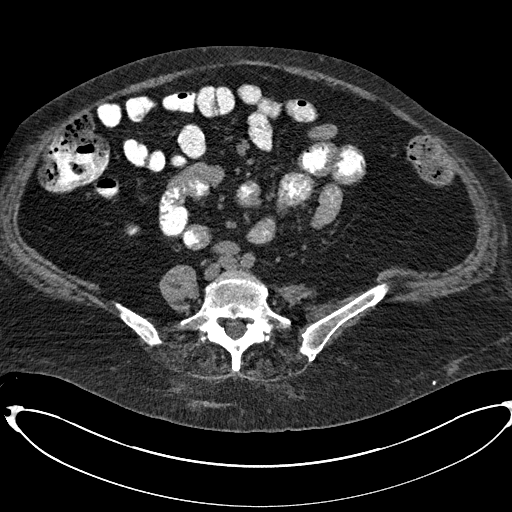
[im 31/154  soft-tissue]
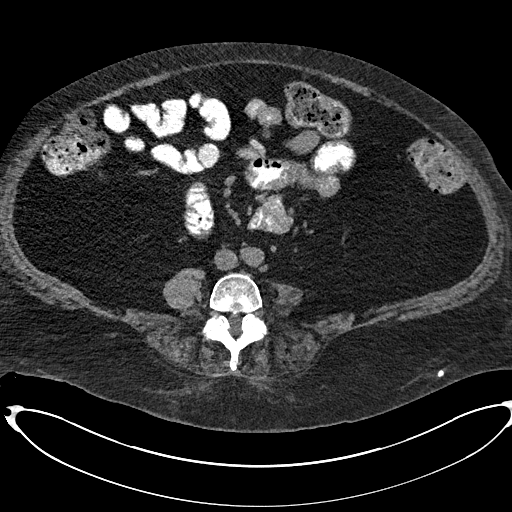
[im 52/154  soft-tissue]
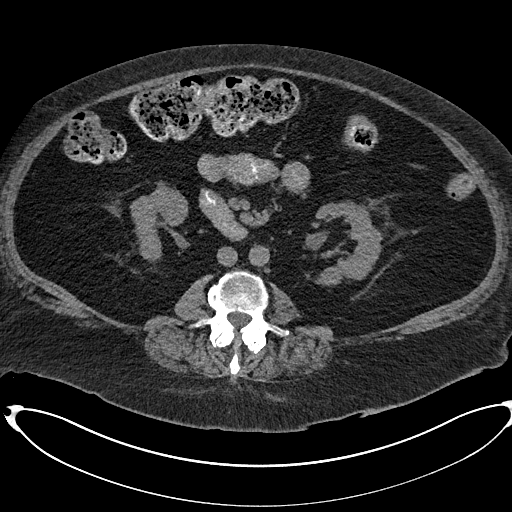
[im 62/154  soft-tissue]
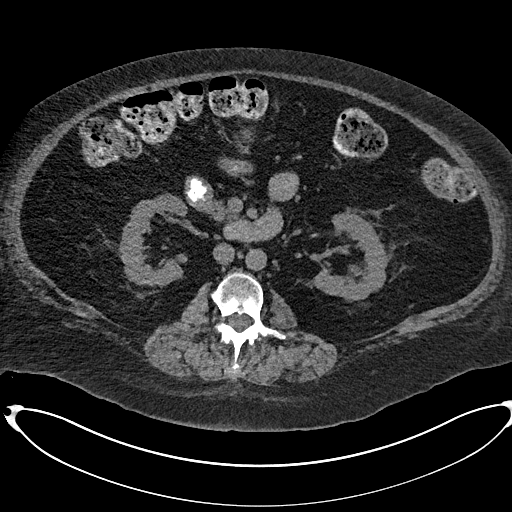
[im 72/154  soft-tissue]
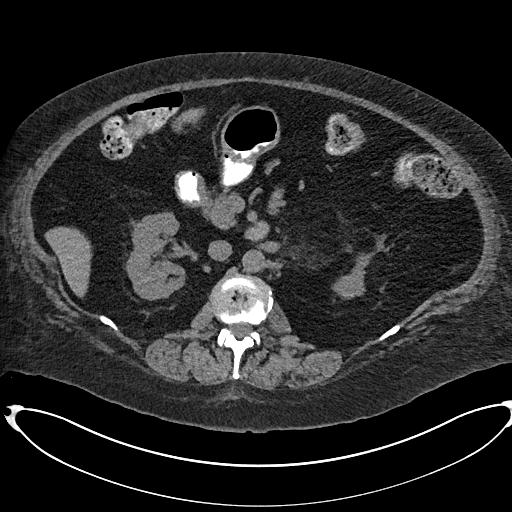
[im 82/154  soft-tissue]
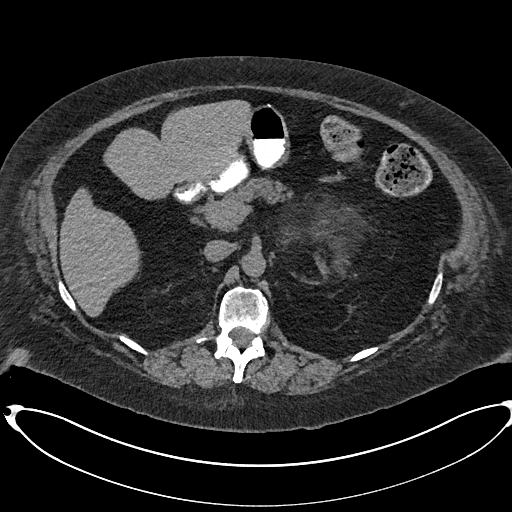
[im 92/154  soft-tissue]
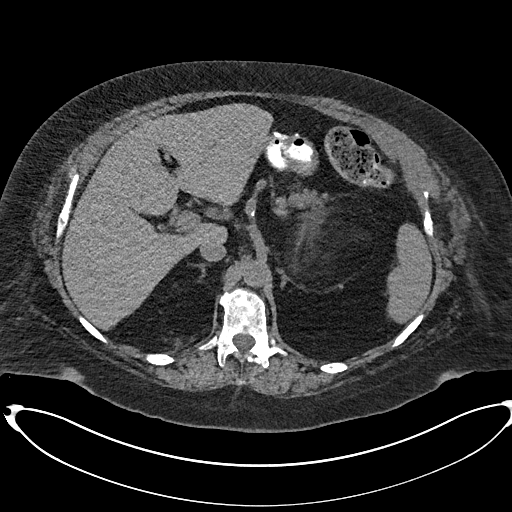
[im 103/154  soft-tissue]
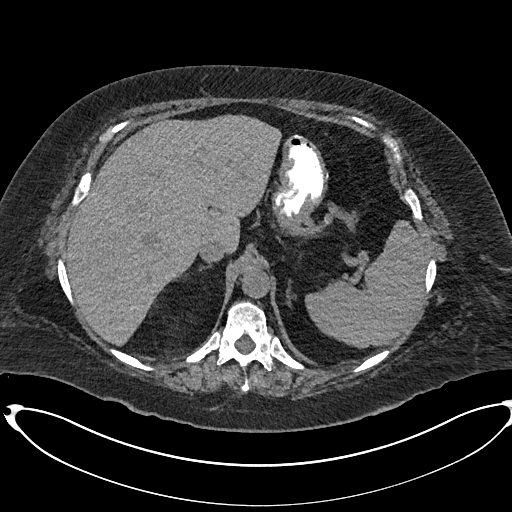
[im 103/154  bone]
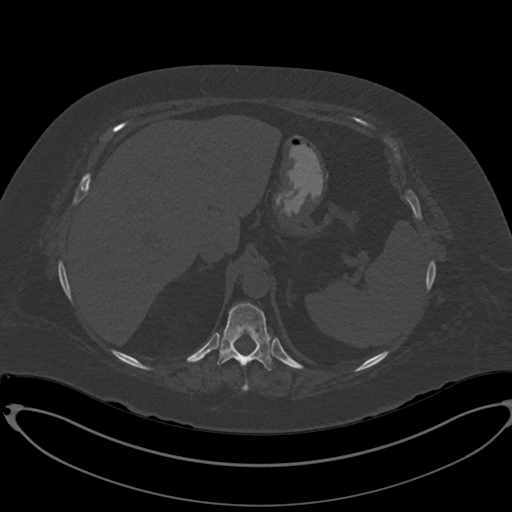
[im 123/154  soft-tissue]
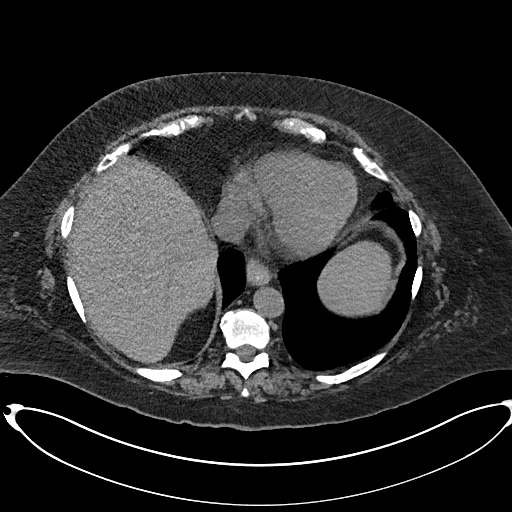
[im 133/154  soft-tissue]
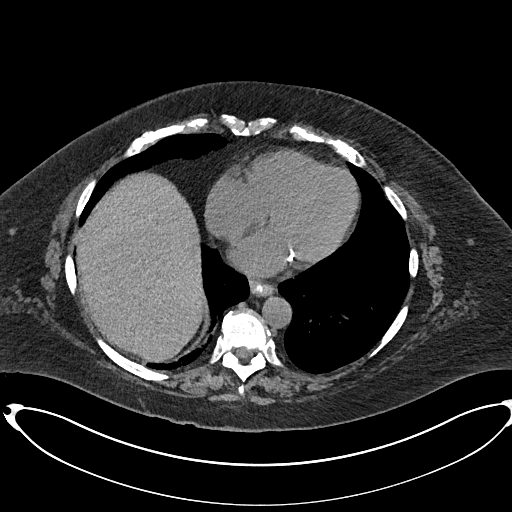
[im 143/154  soft-tissue]
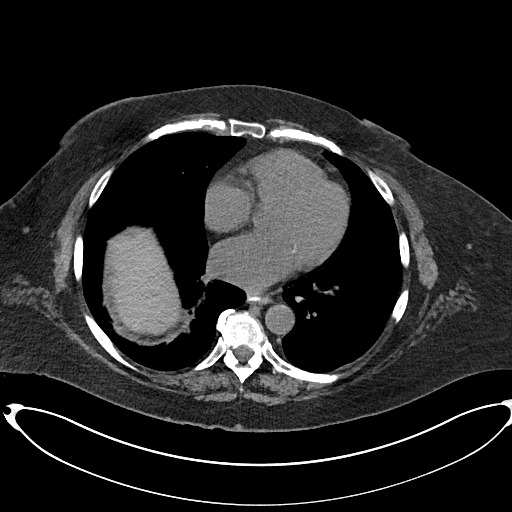

[Series 5: cor st adrenals · coronal · 0.60mm/px · 3 of 169 slices shown]
[im 57/169  soft-tissue]
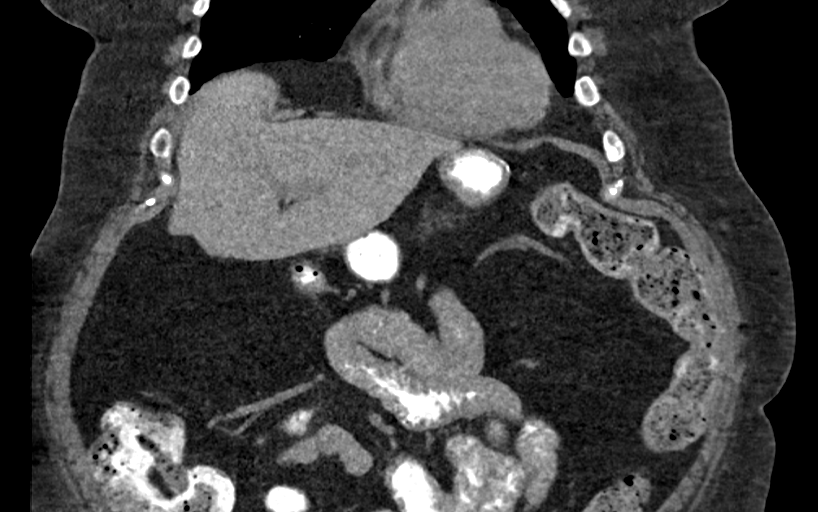
[im 75/169  soft-tissue]
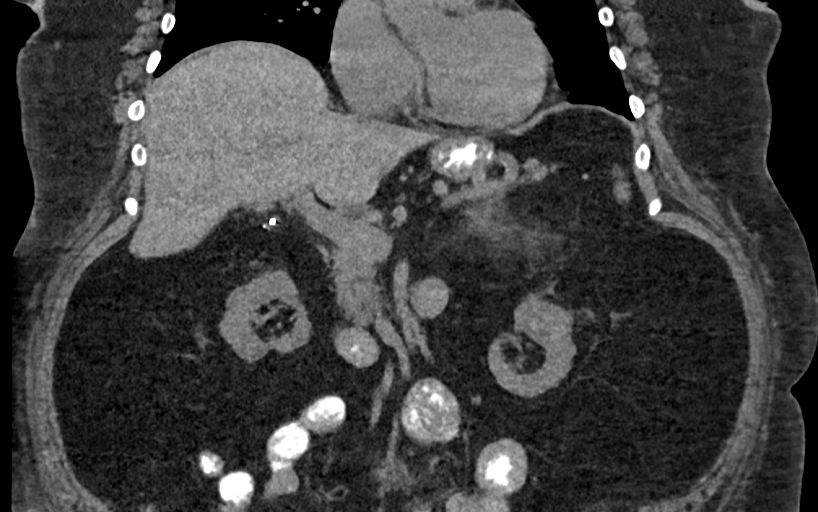
[im 94/169  soft-tissue]
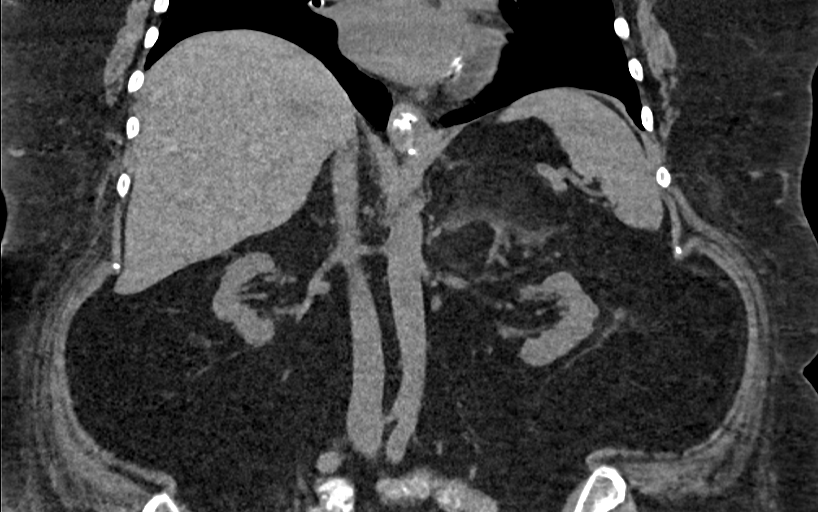

[15 of 46 positions shown; findings below may reference images not displayed]

FINDINGS: Lower chest: No acute abnormality.

Hepatobiliary: No focal liver abnormality is seen. Status post
cholecystectomy. No biliary dilatation.

Pancreas: Unremarkable. No pancreatic ductal dilatation or
surrounding inflammatory changes.

Spleen: Normal in size without focal abnormality.

Adrenals/Urinary Tract: Stable bilateral adrenal masses are noted
with macroscopic fat present consistent with myelolipomas. The right
lesion measures 7.6 cm, and the left lesion measures 9.8 cm.
Bilateral renal cortical scarring is noted. Nonobstructive left
renal calculus is noted. No hydronephrosis or renal obstruction is
noted.

Stomach/Bowel: Status post gastric surgery. No definite abnormality
seen involving the visualized large and small bowel. Visualized
portion of appendix appears normal.

Vascular/Lymphatic: No significant vascular findings are present. No
enlarged abdominal or pelvic lymph nodes.

Other: No abdominal wall hernia or abnormality.

Musculoskeletal: Multilevel degenerative disc disease is noted in
the lumbar spine. No acute osseous abnormality is noted.
IMPRESSION: Stable bilateral adrenal myelolipomas are noted. No further
follow-up is required.

Nonobstructive left renal calculus is noted. No hydronephrosis or
renal obstruction is noted. Bilateral renal cortical scarring is
noted.

## 2019-10-30 NOTE — Telephone Encounter (Addendum)
DPR oin file spoke with the patients husband.  Patient husband sts that the patient is having signs that she exhibited when she was dx with sepsis in June 2021.  She was scheduled to see her ppc but the appt was cancelled due to her current symptoms. Patient does not have a fever, cough, loss in taste. Patient does report having chills and foul smelling urine. No decrease on LOC  Adv the patients husband that these are non-cardiac symptoms. I would recommend that based on the patients past medical hx and current symptoms that she been seen in the ED for evaluation asap. Adv the pt husband that she can be developing sepsis with out signs of a fever.  Pt is reluctant to go to the ED. Pt husband sts that he will take her to an urgent care to be evaluated. Voiced appreciation for the call back.

## 2019-10-30 NOTE — Telephone Encounter (Signed)
Patient called back stating that she is not going to the ED or Urgent Care . That her insurance [employer] will send a nurse by her home to draw her labs and take them to [facility] for resulting and if she is needing an antibiotic they would prescribe it . She informed me she did not feel safe going to the ED or Urgent Care with her underlining conditions.

## 2019-10-30 NOTE — Telephone Encounter (Signed)
Patient calling for asap visit   Per spouse possible relapse of sepsis but unable to have home health draw blood work.   Attempted to see pcp but they will not see in office due to symptoms   Scheduled 8/18

## 2019-10-30 NOTE — Telephone Encounter (Signed)
Patient called stating that her leg is swollen and she has been running a fever for a few days. She state she thinks she has sepsis again . She stated that she was seen in the ED and admitting 6 weeks ago. According to her chart she was seen on 09/02/2019 in the ED and admitted . I advised the patient to go to the ED or urgent care due to her having a fever and leg wound. She stated she would go to Urgent Care. She also mentioned she would have her husband call back to inform us of her results.

## 2019-10-31 ENCOUNTER — Telehealth: Payer: Self-pay | Admitting: Internal Medicine

## 2019-10-31 ENCOUNTER — Other Ambulatory Visit: Payer: Self-pay

## 2019-10-31 ENCOUNTER — Encounter: Payer: Self-pay | Admitting: Internal Medicine

## 2019-10-31 ENCOUNTER — Telehealth (INDEPENDENT_AMBULATORY_CARE_PROVIDER_SITE_OTHER): Payer: PPO | Admitting: Internal Medicine

## 2019-10-31 VITALS — HR 118 | Temp 97.1°F | Ht 65.0 in | Wt 252.0 lb

## 2019-10-31 DIAGNOSIS — R000 Tachycardia, unspecified: Secondary | ICD-10-CM | POA: Diagnosis not present

## 2019-10-31 DIAGNOSIS — R0902 Hypoxemia: Secondary | ICD-10-CM

## 2019-10-31 DIAGNOSIS — R110 Nausea: Secondary | ICD-10-CM

## 2019-10-31 DIAGNOSIS — R0602 Shortness of breath: Secondary | ICD-10-CM | POA: Diagnosis not present

## 2019-10-31 DIAGNOSIS — R6883 Chills (without fever): Secondary | ICD-10-CM | POA: Diagnosis not present

## 2019-10-31 DIAGNOSIS — R519 Headache, unspecified: Secondary | ICD-10-CM | POA: Diagnosis not present

## 2019-10-31 DIAGNOSIS — J029 Acute pharyngitis, unspecified: Secondary | ICD-10-CM

## 2019-10-31 NOTE — Telephone Encounter (Signed)
Waiting for [name] to call back with information she stated she would call pharmacy call back after.

## 2019-10-31 NOTE — Telephone Encounter (Signed)
Pt called in stated she need some refills on some medications but did not have the names of them

## 2019-10-31 NOTE — Progress Notes (Signed)
Telephone Note  I connected with [name]  on 10/31/19 at  4:05 PM EDT by a telephone and verified that I am speaking with the correct person using two identifiers.  Location patient: home Location provider:work or home office Persons participating in the virtual visit: patient, provider, pts husband  I discussed the limitations of evaluation and management by telemedicine and the availability of in person appointments. The patient expressed understanding and agreed to proceed.   HPI: Sick sx's x 4 days h/a, sob O2 down to 83% now up to 90% on room air, sore throat, fever 10/27/19, nausea, chills, fatigue slept 1 pm to 3pm and cough  She was hospitalized 08/2019 with sepsis and had pneumonia   rec pt go to [facility] urgent care or [facility] ED for extent of sx's today she understands and is agreeable   If unable to get out of bed call 911 as she is feeling weak and go to [facility]  ROS: See pertinent positives and negatives per HPI.  Past Medical History:  Diagnosis Date   Abnormal antibody titer    Anxiety and depression    Arthritis    Asthma    Cystocele    Depression    Diabetes ([place])    DVT (deep venous thrombosis) (HCC)    Righ calf   Dyspnea    11/08/2018 - "more now due to lack of exercise"   Dysrhythmia    afib   Edema    GERD (gastroesophageal reflux disease)    Heart murmur    Echocardiogram June 2015: Mild MR (possible vegetation seen on TTE, not seen on TEE), normal LV size with moderate concentric LVH. Normal function EF 60-65%. Normal diastolic function. Mild LA dilation.   History of IBS    History of kidney stones    History of methicillin resistant staphylococcus aureus (MRSA) 2008   Hyperlipidemia    Hypertension    Hypothyroidism    Insomnia    Kidney stone    kidney stones with lithotripsy .  [facility]- "adrenal glands"   Neuropathy involving both lower extremities    Obesity, Class II, BMI 35-39.9, with comorbidity     Paroxysmal atrial fibrillation ([place]) 2007   In June 2015, Cardiac Event Monitor: Mostly SR/sinus arrhythmia with PVCs that are frequent. Short bursts of A. fib lasting several minutes;; CHA2DS2-VASc Score = 5 (age, Female, PVD, DM, HTN)   Peripheral vascular disease ([place])    Sleep apnea    use C-PAP   Urinary incontinence    Venous stasis dermatitis of both lower extremities     Past Surgical History:  Procedure Laterality Date   ANKLE SURGERY Right    APPLICATION OF WOUND VAC Left 06/27/2015   Procedure: APPLICATION OF WOUND VAC ( POSSIBLE ) ;  Surgeon: [name], MD;  Location: [facility] ORS;  Service: Vascular;  Laterality: Left;   APPLICATION OF WOUND VAC Left 09/25/2016   Procedure: APPLICATION OF WOUND VAC;  Surgeon: [name], DPM;  Location: [facility] ORS;  Service: Podiatry;  Laterality: Left;   arm surgery     right     CHOLECYSTECTOMY     COLONOSCOPY     COLONOSCOPY WITH PROPOFOL N/A 01/11/2017   Procedure: COLONOSCOPY WITH PROPOFOL;  Surgeon: [name], MD;  Location: [facility] ENDOSCOPY;  Service: Endoscopy;  Laterality: N/A;   COLONOSCOPY WITH PROPOFOL N/A 02/22/2017   Procedure: COLONOSCOPY WITH PROPOFOL;  Surgeon: [name], MD;  Location: [facility] ENDOSCOPY;  Service: Endoscopy;  Laterality: N/A;   COLONOSCOPY WITH PROPOFOL N/A 05/31/2017   Procedure: COLONOSCOPY WITH PROPOFOL;  Surgeon: [name], MD;  Location: [facility] ENDOSCOPY;  Service: Endoscopy;  Laterality: N/A;   ESOPHAGOGASTRODUODENOSCOPY (EGD) WITH PROPOFOL N/A 01/11/2017   Procedure: ESOPHAGOGASTRODUODENOSCOPY (EGD) WITH PROPOFOL;  Surgeon: [name], MD;  Location: [facility] ENDOSCOPY;  Service: Endoscopy;  Laterality: N/A;   ESOPHAGOGASTRODUODENOSCOPY (EGD) WITH PROPOFOL N/A 10/25/2018   Procedure: ESOPHAGOGASTRODUODENOSCOPY (EGD) WITH PROPOFOL;  Surgeon: [name], MD;  Location: [facility] ENDOSCOPY;  Service: Endoscopy;  Laterality: N/A;   HERNIA REPAIR     umbilical    HIATAL HERNIA REPAIR     I & D EXTREMITY Left 06/27/2015   Procedure: IRRIGATION AND DEBRIDEMENT EXTREMITY            ( CALF HEMATOMA ) POSSIBLE WOUND VAC;  Surgeon: [name], MD;  Location: [facility] ORS;  Service: Vascular;  Laterality: Left;   INCISION AND DRAINAGE OF WOUND Left 09/25/2016   Procedure: IRRIGATION AND DEBRIDEMENT - PARTIAL RESECTION OF ACHILLES TENDON WITH WOUND VAC APPLICATION;  Surgeon: [name], DPM;  Location: [facility];  Service: Podiatry;  Laterality: Left;   INCISION AND DRAINAGE OF WOUND Left 11/09/2018   Procedure: Excision of left foot wound with ACell placement;  Surgeon: Wallace Going, DO;  Location: [place];  Service: Plastics;  Laterality: Left;   IRRIGATION AND DEBRIDEMENT ABSCESS Left 09/07/2016   Procedure: IRRIGATION AND DEBRIDEMENT ABSCESS LEFT HEEL;  Surgeon: [name], DPM;  Location: [facility] OR[facility];  Service: Podiatry;  Laterality: Left;   JOINT REPLACEMENT  2013   left knee replacement   KIDNEY SURGERY Right    kidney stones   LITHOTRIPSY     LOWER EXTREMITY ANGIOGRAPHY Left 04/04/2018   Procedure: LOWER EXTREMITY ANGIOGRAPHY;  Surgeon: [name], MD;  Location: [place] [facility];  Service: Cardiovascular;  Laterality: Left;   NM GATED MYOVIEW [facility] HX)  February 2017   Likely breast attenuation. LOW RISK study. Normal EF 55-60%.   OTHER SURGICAL HISTORY     08/2016 or 09/2016 surgery on achilles tenso h/o staph infection heal. Dr. [name]   removal of left hematoma Left    leg   REVERSE SHOULDER ARTHROPLASTY Right 10/08/2015   Procedure: REVERSE SHOULDER ARTHROPLASTY;  Surgeon: [name], MD;  Location: [facility] ORS;  Service: Orthopedics;  Laterality: Right;   SLEEVE GASTROPLASTY     TONSILLECTOMY     TOTAL KNEE ARTHROPLASTY Left    TOTAL SHOULDER REPLACEMENT Right    TRANSESOPHAGEAL ECHOCARDIOGRAM  08/08/2013   Mild LVH, EF 60-65%. Moderate LA dilation and mild RA dilation. Mild MR with no evidence of stenosis and no  evidence of endocarditis. A false chordae is noted.   TRANSTHORACIC ECHOCARDIOGRAM  08/03/2013   Mild-Moderate concentric LVH, EF 60-65%. Normal diastolic function. Mild LA dilation. Mild MR with possible vegetation  - not confirmed on TEE    ULNAR NERVE TRANSPOSITION Right 08/06/2016   Procedure: ULNAR NERVE DECOMPRESSION/TRANSPOSITION;  Surgeon: [name], MD;  Location: [facility] ORS;  Service: Orthopedics;  Laterality: Right;   UPPER GI ENDOSCOPY     VAGINAL HYSTERECTOMY      Family History  Problem Relation Age of Onset   Skin cancer Father    Diabetes Father    Hypertension Father    Peripheral vascular disease Father    Cancer Father    Cerebral aneurysm Father    Alcohol abuse Father    Varicose Veins Mother  Kidney disease Mother    Arthritis Mother    Mental illness Sister    Cancer Maternal Aunt        breast   Breast cancer Maternal Aunt    Arthritis Maternal Grandmother    Hypertension Maternal Grandmother    Diabetes Maternal Grandmother    Arthritis Maternal Grandfather    Heart disease Maternal Grandfather    Hypertension Maternal Grandfather    Arthritis Paternal Grandmother    Hypertension Paternal Grandmother    Diabetes Paternal Grandmother    Arthritis Paternal Grandfather    Hypertension Paternal Grandfather    Bladder Cancer Neg Hx    Kidney cancer Neg Hx     SOCIAL HX: lives with husband   Current Outpatient Medications:    acetaminophen (TYLENOL) 325 MG tablet, Take 2 tablets (650 mg total) by mouth every 6 (six) hours as needed for mild pain or moderate pain (or Fever >/= 101)., Disp: , Rfl:    albuterol (PROVENTIL HFA;VENTOLIN HFA) 108 (90 Base) MCG/ACT inhaler, Inhale 1-2 puffs into the lungs every 4 (four) hours as needed for wheezing or shortness of breath. (Patient taking differently: Inhale 2 puffs into the lungs every 4 (four) hours as needed for wheezing or shortness of breath. ), Disp: 8 g, Rfl: 2   blood  glucose meter kit and supplies KIT, Dispense based on patient and insurance preference. Use up to 2 times daily as directed. (FOR ICD-10 E11.9)., Disp: 1 each, Rfl: 0   buPROPion (WELLBUTRIN XL) 150 MG 24 hr tablet, Take 1 tablet (150 mg total) by mouth daily., Disp: 90 tablet, Rfl: 3   dabigatran (PRADAXA) 150 MG CAPS capsule, Take 1 capsule (150 mg total) by mouth 2 (two) times daily. Please call to schedule office visit for further refills. Thank you!, Disp: 60 capsule, Rfl: 0   DULoxetine (CYMBALTA) 60 MG capsule, Take 60 mg by mouth daily., Disp: , Rfl:    DULoxetine 40 MG CPEP, Take 40 mg by mouth daily., Disp: 30 capsule, Rfl: 0   estradiol (ESTRACE) 0.1 MG/GM vaginal cream, Place 1 Applicatorful vaginally every Monday, Wednesday, and Friday. Apply 0.82m (pea-sized amount)  just inside the vaginal introitus with a finger-tip on Monday, Wednesday and Friday nights,, Disp: 42.5 g, Rfl: 11   fluticasone-salmeterol (ADVAIR HFA) 115-21 MCG/ACT inhaler, Inhale 2 puffs into the lungs daily. Rinse mouth thoroughly after use, Disp: 1 Inhaler, Rfl: 12   hydrALAZINE (APRESOLINE) 25 MG tablet, Take 1 tablet (25 mg total) by mouth 2 (two) times daily., Disp: 120 tablet, Rfl: 11   hydrochlorothiazide (HYDRODIURIL) 12.5 MG tablet, Take 1 tablet (12.5 mg total) by mouth daily., Disp: 90 tablet, Rfl: 1   HYDROcodone-acetaminophen (NORCO/VICODIN) 5-325 MG tablet, Take 1 tablet by mouth every 8 (eight) hours as needed for severe pain. Must last 30 days, Disp: 90 tablet, Rfl: 0   Lancets (ONETOUCH DELICA PLUS LIWOEHO12Y MISC, TEST UP TO TWICE DAILY, Disp: , Rfl:    levocetirizine (XYZAL) 5 MG tablet, Take 0.5 tablets (2.5 mg total) by mouth every evening., Disp: 45 tablet, Rfl: 3   levothyroxine (SYNTHROID) 175 MCG tablet, Take 1 tablet (175 mcg total) by mouth daily before breakfast., Disp: 90 tablet, Rfl: 3   metFORMIN (GLUCOPHAGE) 1000 MG tablet, Take 1 tablet (1,000 mg total) by mouth 2 (two) times  daily. With food, Disp: 180 tablet, Rfl: 3   metoprolol succinate (TOPROL-XL) 50 MG 24 hr tablet, TAKE 1 TABLET BY MOUTH DAILY, Disp: 90 tablet, Rfl: 0  montelukast (SINGULAIR) 10 MG tablet, Take 1 tablet (10 mg total) by mouth at bedtime., Disp: 90 tablet, Rfl: 3   mupirocin ointment (BACTROBAN) 2 %, Apply 1 application topically 2 (two) times daily. Skin wounds, Disp: 30 g, Rfl: 0   ondansetron (ZOFRAN) 4 MG tablet, Take 1 tablet (4 mg total) by mouth every 8 (eight) hours as needed for nausea or vomiting., Disp: 90 tablet, Rfl: 1   ONETOUCH ULTRA test strip, TEST UP TO TWICE DAILY, Disp: 100 each, Rfl: 1   pantoprazole (PROTONIX) 40 MG tablet, Take by mouth., Disp: , Rfl:    pregabalin (LYRICA) 50 MG capsule, Take 1 capsule (50 mg total) by mouth 3 (three) times daily., Disp: 90 capsule, Rfl: 5   TOVIAZ 8 MG TB24 tablet, TAKE ONE TABLET BY MOUTH EVERY DAY, Disp: 90 tablet, Rfl: 3   Vitamin D, Ergocalciferol, (DRISDOL) 1.25 MG (50000 UNIT) CAPS capsule, Take 1 capsule (50,000 Units total) by mouth once a week., Disp: 5 capsule, Rfl:    zinc oxide 20 % ointment, Apply 1 application topically as needed for irritation., Disp: 56.7 g, Rfl: 0   albuterol (PROVENTIL) (2.5 MG/3ML) 0.083% nebulizer solution, Take 3 mLs (2.5 mg total) by nebulization every 6 (six) hours as needed for wheezing or shortness of breath. (Patient not taking: Reported on 10/31/2019), Disp: 150 mL, Rfl: 1   ALPRAZolam (XANAX) 0.5 MG tablet, Take 0.5 tablets (0.25 mg total) by mouth 2 (two) times daily as needed for anxiety. (Patient not taking: Reported on 10/31/2019), Disp: 60 tablet, Rfl: 2   colchicine 0.6 MG tablet, Take by mouth. (Patient not taking: Reported on 10/31/2019), Disp: , Rfl:    nystatin cream (MYCOSTATIN), Apply 1 application topically 2 (two) times daily. Mouth prn for 7 days as needed (Patient not taking: Reported on 10/31/2019), Disp: 30 g, Rfl: 11   trospium (SANCTURA) 20 MG tablet, Take 1 tablet  (20 mg total) by mouth 2 (two) times daily. (Patient not taking: Reported on 10/31/2019), Disp: 60 tablet, Rfl: 2  Current Facility-Administered Medications:    ipratropium-albuterol (DUONEB) 0.5-2.5 (3) MG/3ML nebulizer solution 3 mL, 3 mL, Nebulization, Q6H, [name], [name], MD  EXAM:  VITALS per patient if applicable:  GENERAL: alert, oriented, appears well and in no acute distress  LUNGS: no signs of respiratory distress, breathing rate appears normal, no obvious gross SOB, gasping or wheezing Cough on exam   PSYCH/NEURO: pleasant and cooperative, no obvious depression or anxiety, speech and thought processing grossly intact  ASSESSMENT AND PLAN:  Discussed the following assessment and plan:  Hypoxia ? Asthma exacerbation vs pneumonia  Sore throat Nausea Chills need to f/u sepsis h/o 08/2019 CT + pneumonia Nonintractable headache, unspecified chronicity pattern, unspecified headache type Shortness of breath Sinus tachycardia Rec. Labs, CXR, urine with culture  -pt will go to [facility] urgent care or [facility] ED  -we discussed possible serious and likely etiologies, options for evaluation and workup, limitations of telemedicine visit vs in person visit, treatment, treatment risks and precautions. Pt prefers to treat via telemedicine empirically rather then risking or undertaking an in person visit at this moment. Patient agrees to seek prompt in person care if worsening, new symptoms arise, or if is not improving with treatment.   I discussed the assessment and treatment plan with the patient. The patient was provided an opportunity to ask questions and all were answered. The patient agreed with the plan and demonstrated an understanding of the instructions.   The patient was  advised to call back or seek an in-person evaluation if the symptoms worsen or if the condition fails to improve as anticipated.  Time 10 min Delorise Jackson, MD

## 2019-10-31 NOTE — Telephone Encounter (Signed)
Was able to reach Patient

## 2019-10-31 NOTE — Progress Notes (Signed)
Patient presenting with sore throat, chills, weakness, headache since last week, and had a fever that has come back down. Onset of symptoms was last Friday. No one around her is sick to her knowledge. Patient also having SOB with an O2 of 83, this has since risen to 90.   Patient also suspects UTI, having urinary frequency, odor, and discoloration.   Patient has not been Covid tested.

## 2019-10-31 NOTE — Telephone Encounter (Signed)
Left message to return call. Patient scheduled for a 8/10 3:30 telephone visit.  Called home and mobile number 5 minutes apart (3:30,3:35 pm).

## 2019-11-01 ENCOUNTER — Telehealth: Payer: Self-pay

## 2019-11-01 ENCOUNTER — Telehealth: Payer: Self-pay | Admitting: Internal Medicine

## 2019-11-01 DIAGNOSIS — N390 Urinary tract infection, site not specified: Secondary | ICD-10-CM | POA: Diagnosis not present

## 2019-11-01 DIAGNOSIS — Z03818 Encounter for observation for suspected exposure to other biological agents ruled out: Secondary | ICD-10-CM | POA: Diagnosis not present

## 2019-11-01 DIAGNOSIS — R6883 Chills (without fever): Secondary | ICD-10-CM | POA: Diagnosis not present

## 2019-11-01 NOTE — Telephone Encounter (Signed)
Patient's husband called stating patient is having weakness, chills, fatigue, sore throat and cough. Denies fever, nausea, vomiting, and no urinary symptoms. He states he has noticied an odor to her urine today but no frequency, urgency, blood, or confusion. It was explained that odor is not a sign of infection but due to lack of water intake and she should increase water intake and follow up with PCP for other symptoms. He states he spoke with PCP already and they believe she should go to ER for possible covid and would not see her in clinic. Patient's husband does not wish to go to ER and would like [place] to see to order lab work. It was explained that we do not do this at our clinic we only would evaluate for urinary symptoms and he could perhaps try urgent care or acute care. If not follow up for further evaluation with ER.

## 2019-11-01 NOTE — Telephone Encounter (Signed)
Lm on vm to call office and schedule a 67m follow up-kp

## 2019-11-08 ENCOUNTER — Ambulatory Visit: Payer: PPO | Admitting: Family

## 2019-11-08 ENCOUNTER — Other Ambulatory Visit: Payer: Self-pay | Admitting: Cardiovascular Disease

## 2019-11-08 NOTE — Progress Notes (Deleted)
Office Visit    Patient Name: [name] Date of Encounter: 11/08/2019  Primary Care Provider:  [name], [name], MD Primary Cardiologist:  Kathlyn Sacramento, MD Electrophysiologist:  None   Chief Complaint    [name] is a 69 y.o. adult with a hx of PAF, DM2, HTN, HLD, obesity, previous DVT, OSA on CPAP presents today for ***   Past Medical History    Past Medical History:  Diagnosis Date  . Abnormal antibody titer   . Anxiety and depression   . Arthritis   . Asthma   . Cystocele   . Depression   . Diabetes ([place])   . DVT (deep venous thrombosis) (H[facility])    Righ calf  . Dyspnea    11/08/2018 - "more now due to lack of exercise"  . Dysrhythmia    afib  . Edema   . GERD (gastroesophageal reflux disease)   . Heart murmur    Echocardiogram June 2015: Mild MR (possible vegetation seen on TTE, not seen on TEE), normal LV size with moderate concentric LVH. Normal function EF 60-65%. Normal diastolic function. Mild LA dilation.  Marland Kitchen History of IBS   . History of kidney stones   . History of methicillin resistant staphylococcus aureus (MRSA) 2008  . Hyperlipidemia   . Hypertension   . Hypothyroidism   . Insomnia   . Kidney stone    kidney stones with lithotripsy .  [facility]- "adrenal glands"  . Neuropathy involving both lower extremities   . Obesity, Class II, BMI 35-39.9, with comorbidity   . Paroxysmal atrial fibrillation ([place]) 2007   In June 2015, Cardiac Event Monitor: Mostly SR/sinus arrhythmia with PVCs that are frequent. Short bursts of A. fib lasting several minutes;; CHA2DS2-VASc Score = 5 (age, Female, PVD, DM, HTN)  . Peripheral vascular disease ([place])   . Sleep apnea    use C-PAP  . Urinary incontinence   . Venous stasis dermatitis of both lower extremities    Past Surgical History:  Procedure Laterality Date  . ANKLE SURGERY Right   . APPLICATION OF WOUND VAC Left 06/27/2015   Procedure: APPLICATION OF WOUND VAC ( POSSIBLE ) ;   Surgeon: [name], MD;  Location: [facility];  Service: Vascular;  Laterality: Left;  . APPLICATION OF WOUND VAC Left 09/25/2016   Procedure: APPLICATION OF WOUND VAC;  Surgeon: [name], DPM;  Location: [facility];  Service: Podiatry;  Laterality: Left;  . arm surgery     right    . CHOLECYSTECTOMY    . COLONOSCOPY    . COLONOSCOPY WITH PROPOFOL N/A 01/11/2017   Procedure: COLONOSCOPY WITH PROPOFOL;  Surgeon: [name], MD;  Location: [facility];  Service: Endoscopy;  Laterality: N/A;  . COLONOSCOPY WITH PROPOFOL N/A 02/22/2017   Procedure: COLONOSCOPY WITH PROPOFOL;  Surgeon: [name], MD;  Location: [facility] ENDOSCOPY;  Service: Endoscopy;  Laterality: N/A;  . COLONOSCOPY WITH PROPOFOL N/A 05/31/2017   Procedure: COLONOSCOPY WITH PROPOFOL;  Surgeon: [name], MD;  Location: [facility]OSCOPY;  Service: Endoscopy;  Laterality: N/A;  . ESOPHAGOGASTRODUODENOSCOPY (EGD) WITH PROPOFOL N/A 01/11/2017   Procedure: ESOPHAGOGASTRODUODENOSCOPY (EGD) WITH PROPOFOL;  Surgeon: [name], MD;  Location: [facility];  Service: Endoscopy;  Laterality: N/A;  . ESOPHAGOGASTRODUODENOSCOPY (EGD) WITH PROPOFOL N/A 10/25/2018   Procedure: ESOPHAGOGASTRODUODENOSCOPY (EGD) WITH PROPOFOL;  Surgeon: [name], MD;  Location: [facility] ENDOSCOPY;  Service: Endoscopy;  Laterality: N/A;  . HERNIA REPAIR  umbilical  . HIATAL HERNIA REPAIR    . I & D EXTREMITY Left 06/27/2015   Procedure: IRRIGATION AND DEBRIDEMENT EXTREMITY            ( CALF HEMATOMA ) POSSIBLE WOUND VAC;  Surgeon: [name], MD;  Location: [facility];  Service: Vascular;  Laterality: Left;  . INCISION AND DRAINAGE OF WOUND Left 09/25/2016   Procedure: IRRIGATION AND DEBRIDEMENT - PARTIAL RESECTION OF ACHILLES TENDON WITH WOUND VAC APPLICATION;  Surgeon: [name], DPM;  Location: [facility];  Service: Podiatry;  Laterality: Left;  . INCISION AND DRAINAGE OF WOUND Left 11/09/2018   Procedure: Excision of  left foot wound with ACell placement;  Surgeon: Wallace Going, DO;  Location: [facility];  Service: Plastics;  Laterality: Left;  . IRRIGATION AND DEBRIDEMENT ABSCESS Left 09/07/2016   Procedure: IRRIGATION AND DEBRIDEMENT ABSCESS LEFT HEEL;  Surgeon: [name], DPM;  Location: [facility];  Service: Podiatry;  Laterality: Left;  . JOINT REPLACEMENT  2013   left knee replacement  . KIDNEY SURGERY Right    kidney stones  . LITHOTRIPSY    . LOWER EXTREMITY ANGIOGRAPHY Left 04/04/2018   Procedure: LOWER EXTREMITY ANGIOGRAPHY;  Surgeon: [name], MD;  Location: [facility];  Service: Cardiovascular;  Laterality: Left;  . NM GATED MYOVIEW ([place] HX)  February 2017   Likely breast attenuation. LOW RISK study. Normal EF 55-60%.  . OTHER SURGICAL HISTORY     08/2016 or 09/2016 surgery on achilles tenso h/o staph infection heal. Dr. [name]  . removal of left hematoma Left    leg  . REVERSE SHOULDER ARTHROPLASTY Right 10/08/2015   Procedure: REVERSE SHOULDER ARTHROPLASTY;  Surgeon: [name], MD;  Location: [facility];  Service: Orthopedics;  Laterality: Right;  . SLEEVE GASTROPLASTY    . TONSILLECTOMY    . TOTAL KNEE ARTHROPLASTY Left   . TOTAL SHOULDER REPLACEMENT Right   . TRANSESOPHAGEAL ECHOCARDIOGRAM  08/08/2013   Mild LVH, EF 60-65%. Moderate LA dilation and mild RA dilation. Mild MR with no evidence of stenosis and no evidence of endocarditis. A false chordae is noted.  . TRANSTHORACIC ECHOCARDIOGRAM  08/03/2013   Mild-Moderate concentric LVH, EF 60-65%. Normal diastolic function. Mild LA dilation. Mild MR with possible vegetation  - not confirmed on TEE   . ULNAR NERVE TRANSPOSITION Right 08/06/2016   Procedure: ULNAR NERVE DECOMPRESSION/TRANSPOSITION;  Surgeon: [name], MD;  Location: [facility];  Service: Orthopedics;  Laterality: Right;  . UPPER GI ENDOSCOPY    . VAGINAL HYSTERECTOMY      Allergies  Allergies  Allergen Reactions  . Morphine And Related Other  (See Comments) and Nausea Only    Patient becomes very confused Other reaction(s): Other (See Comments), Other (See Comments) Patient becomes very confused Patient becomes very confused   . Penicillins Hives, Shortness Of Breath, Swelling and Other (See Comments)    Facial swelling Has patient had a PCN reaction causing immediate rash, facial/tongue/throat swelling, SOB or lightheadedness with hypotension: Yes Has patient had a PCN reaction causing severe rash involving mucus membranes or skin necrosis: Yes Has patient had a PCN reaction that required hospitalization No Has patient had a PCN reaction occurring within the last 10 years: No If all of the above answers are "NO", then may proceed with Cephalosporin use.  Other reaction(s): Other (See Comments), Other (See Comments) Facial swelling Has patient had a PCN reaction causing immediate rash, facial/tongue/throat swelling, SOB or lightheadedness with hypotension: Yes  Has patient had a PCN reaction causing severe rash involving mucus membranes or skin necrosis: Yes Has patient had a PCN reaction that required hospitalization No Has patient had a PCN reaction occurring within the last 10 years: No If all of the above answers are "NO", then may proceed with Cephalosporin use. Facial swelling Has patient had a PCN reaction causing immediate rash, facial/tongue/throat swelling, SOB or lightheadedness with hypotension: Yes Has patient had a PCN reaction causing severe rash involving mucus membranes or skin necrosis: Yes Has patient had a PCN reaction that required hospitalization No Has patient had a PCN reaction occurring within the last 10 years: No If all of the above answers are "NO", then may proceed with Cephalosporin use. Facial swelling Has patient had a PCN reaction causing immediate rash, facial/tongue/throat swelling, SOB or lightheadedness with hypotension: Yes Has patient had a PCN reaction causing severe rash involving mucus  membranes or skin necrosis: Yes Has patient had a PCN reaction that required hospitalization No Has patient had a PCN reaction occurring within the last 10 years: No If all of the above answers are "NO", then may proceed with Cephalosporin use.   . Ambien [Zolpidem]     Hallucination   . Aspirin Hives  . Oxytetracycline Hives    Other reaction(s): Unknown     History of Present Illness    [name] is a 69 y.o. adult with a hx of PAF, DM2, HTN, HLD, obesity, previous DVT, OSA on CPAP.  She was last seen 12/30/18 by Dr. Fletcher Anon via telemedicine.  She follows with Dr. Lucky Cowboy for chronic left foot ulceration.  There was no evidence of PAD on noninvasive Doppler as well as angiogram done in 2020.  She had debridement with subsequent improvement.  She had atypical chest pain in the past and had Lexiscan Myoview 03/2018 was low risk with no evidence of ischemia.  Her PAF has been well controlled on Toprol 50 daily, she had bradycardia on higher doses.  She is anticoagulated with Pradaxa.  She was admitted June 2021 with pneumonia and sepsis.  She had minimally elevated troponins of 120, 170, 143 which were asymptomatic and was presumed to be due to demand ischemia. Echocardiogram while admitted with EF 60 to 65%, no RWMA, RV normal size and function, no significant valvular abnormalities.  She called the office 10/30/2019 noting similar symptoms to when she had sepsis and was very concerned.  EKGs/Labs/Other Studies Reviewed:   The following studies were reviewed today: *** Echo 08/2019 1. Left ventricular ejection fraction, by estimation, is 60 to 65%. The  left ventricle has normal function. The left ventricle has no regional  wall motion abnormalities. Left ventricular diastolic parameters were  normal.   2. Right ventricular systolic function is normal. The right ventricular  size is normal.   3. The mitral valve is normal in structure. No evidence of mitral valve  regurgitation. No  evidence of mitral stenosis.   4. The aortic valve is normal in structure. Aortic valve regurgitation is  not visualized. No aortic stenosis is present.   5. The inferior vena cava is normal in size with <50% respiratory  variability, suggesting right atrial pressure of 8 mmHg.   EKG:  EKG is ordered today.  The ekg ordered today demonstrates ***  Recent Labs: 09/02/2019: B Natriuretic Peptide 384.0 09/14/2019: ALT 11; BUN 46; Creatinine, Ser 1.27; Hemoglobin 10.5; Platelets 307; Potassium 6.0 No hemolysis seen; Sodium 137  Recent Lipid Panel    Component  Value Date/Time   CHOL 181 05/12/2018 1028   CHOL 185 10/10/2014 0822   TRIG 95.0 05/12/2018 1028   HDL 82.60 05/12/2018 1028   HDL 94 10/10/2014 0822   CHOLHDL 2 05/12/2018 1028   VLDL 19.0 05/12/2018 1028   LDLCALC 80 05/12/2018 1028   LDLCALC 73 10/10/2014 0822    Home Medications   No outpatient medications have been marked as taking for the 11/08/19 encounter (Appointment) with Loel Dubonnet, NP.   Current Facility-Administered Medications for the 11/08/19 encounter (Appointment) with Loel Dubonnet, NP  Medication  . ipratropium-albuterol (DUONEB) 0.5-2.5 (3) MG/3ML nebulizer solution 3 mL      Review of Systems    ***   ROS All other systems reviewed and are otherwise negative except as noted above.  Physical Exam    VS:  There were no vitals taken for this visit. , BMI There is no height or weight on file to calculate BMI. GEN: Well nourished, well developed, in no acute distress. HEENT: normal. Neck: Supple, no JVD, carotid bruits, or masses. Cardiac: ***RRR, no murmurs, rubs, or gallops. No clubbing, cyanosis, edema.  ***Radials/DP/PT 2+ and equal bilaterally.  Respiratory:  ***Respirations regular and unlabored, clear to auscultation bilaterally. GI: Soft, nontender, nondistended, BS + x 4. MS: No deformity or atrophy. Skin: Warm and dry, no rash. Neuro:  Strength and sensation are intact. Psych:  Normal affect.  Assessment & Plan    1. PAF/chronic anticoagulation -  2. HTN -  3. OSA -   Disposition: Follow up {follow up:15908} with Dr. Fletcher Anon or APP   Loel Dubonnet, NP 11/08/2019, 8:06 AM

## 2019-11-09 ENCOUNTER — Encounter: Payer: Self-pay | Admitting: Family

## 2019-11-09 ENCOUNTER — Other Ambulatory Visit: Payer: Self-pay

## 2019-11-09 ENCOUNTER — Ambulatory Visit: Payer: PPO | Admitting: Family

## 2019-11-09 VITALS — BP 128/80 | HR 94 | Ht 65.0 in

## 2019-11-09 DIAGNOSIS — R0600 Dyspnea, unspecified: Secondary | ICD-10-CM | POA: Diagnosis not present

## 2019-11-09 DIAGNOSIS — R5383 Other fatigue: Secondary | ICD-10-CM | POA: Diagnosis not present

## 2019-11-09 DIAGNOSIS — R42 Dizziness and giddiness: Secondary | ICD-10-CM | POA: Diagnosis not present

## 2019-11-09 DIAGNOSIS — I480 Paroxysmal atrial fibrillation: Secondary | ICD-10-CM | POA: Diagnosis not present

## 2019-11-09 DIAGNOSIS — R0609 Other forms of dyspnea: Secondary | ICD-10-CM

## 2019-11-09 DIAGNOSIS — N3000 Acute cystitis without hematuria: Secondary | ICD-10-CM | POA: Diagnosis not present

## 2019-11-09 DIAGNOSIS — I10 Essential (primary) hypertension: Secondary | ICD-10-CM

## 2019-11-09 DIAGNOSIS — Z7901 Long term (current) use of anticoagulants: Secondary | ICD-10-CM

## 2019-11-09 MED ORDER — METOPROLOL SUCCINATE ER 50 MG PO TB24: ORAL_TABLET | ORAL | 1 refills | Status: DC

## 2019-11-09 NOTE — Progress Notes (Signed)
Office Visit    Patient Name: [name] Date of Encounter: 11/09/2019  Primary Care Provider:  [name], [name], MD Primary Cardiologist:  Kathlyn Sacramento, MD Electrophysiologist:  None   Chief Complaint    [name] is a 69 y.o. adult with a hx of PAF, DM2, HTN, HLD, obesity, previous DVT, OSA on CPAP presents today for fatigue   Past Medical History    Past Medical History:  Diagnosis Date  . Abnormal antibody titer   . Anxiety and depression   . Arthritis   . Asthma   . Cystocele   . Depression   . Diabetes ([place])   . DVT (deep venous thrombosis) (HCC)    Righ calf  . Dyspnea    11/08/2018 - "more now due to lack of exercise"  . Dysrhythmia    afib  . Edema   . GERD (gastroesophageal reflux disease)   . Heart murmur    Echocardiogram June 2015: Mild MR (possible vegetation seen on TTE, not seen on TEE), normal LV size with moderate concentric LVH. Normal function EF 60-65%. Normal diastolic function. Mild LA dilation.  Marland Kitchen History of IBS   . History of kidney stones   . History of methicillin resistant staphylococcus aureus (MRSA) 2008  . Hyperlipidemia   . Hypertension   . Hypothyroidism   . Insomnia   . Kidney stone    kidney stones with lithotripsy .  [facility]- "adrenal glands"  . Neuropathy involving both lower extremities   . Obesity, Class II, BMI 35-39.9, with comorbidity   . Paroxysmal atrial fibrillation ([place]) 2007   In June 2015, Cardiac Event Monitor: Mostly SR/sinus arrhythmia with PVCs that are frequent. Short bursts of A. fib lasting several minutes;; CHA2DS2-VASc Score = 5 (age, Female, PVD, DM, HTN)  . Peripheral vascular disease ([place])   . Sleep apnea    use C-PAP  . Urinary incontinence   . Venous stasis dermatitis of both lower extremities    Past Surgical History:  Procedure Laterality Date  . ANKLE SURGERY Right   . APPLICATION OF WOUND VAC Left 06/27/2015   Procedure: APPLICATION OF WOUND VAC ( POSSIBLE ) ;   Surgeon: [name], MD;  Location: [facility] ORS;  Service: Vascular;  Laterality: Left;  . APPLICATION OF WOUND VAC Left 09/25/2016   Procedure: APPLICATION OF WOUND VAC;  Surgeon: [name], DPM;  Location: [facility]S;  Service: Podiatry;  Laterality: Left;  . arm surgery     right    . CHOLECYSTECTOMY    . COLONOSCOPY    . COLONOSCOPY WITH PROPOFOL N/A 01/11/2017   Procedure: COLONOSCOPY WITH PROPOFOL;  Surgeon: [name], MD;  Location: [facility] ENDOSCOPY;  Service: Endoscopy;  Laterality: N/A;  . COLONOSCOPY WITH PROPOFOL N/A 02/22/2017   Procedure: COLONOSCOPY WITH PROPOFOL;  Surgeon: [name], MD;  Location: [facility] ENDOSCOPY;  Service: Endoscopy;  Laterality: N/A;  . COLONOSCOPY WITH PROPOFOL N/A 05/31/2017   Procedure: COLONOSCOPY WITH PROPOFOL;  Surgeon: [name], MD;  Location: [facility] ENDOSCOPY;  Service: Endoscopy;  Laterality: N/A;  . ESOPHAGOGASTRODUODENOSCOPY (EGD) WITH PROPOFOL N/A 01/11/2017   Procedure: ESOPHAGOGASTRODUODENOSCOPY (EGD) WITH PROPOFOL;  Surgeon: [name], MD;  Location: [facility] ENDOSCOPY;  Service: Endoscopy;  Laterality: N/A;  . ESOPHAGOGASTRODUODENOSCOPY (EGD) WITH PROPOFOL N/A 10/25/2018   Procedure: ESOPHAGOGASTRODUODENOSCOPY (EGD) WITH PROPOFOL;  Surgeon: [name], MD;  Location: [facility] ENDOSCOPY;  Service: Endoscopy;  Laterality: N/A;  . HERNIA REPAIR  umbilical  . HIATAL HERNIA REPAIR    . I & D EXTREMITY Left 06/27/2015   Procedure: IRRIGATION AND DEBRIDEMENT EXTREMITY            ( CALF HEMATOMA ) POSSIBLE WOUND VAC;  Surgeon: [name], MD;  Location: [facility] ORS;  Service: Vascular;  Laterality: Left;  . INCISION AND DRAINAGE OF WOUND Left 09/25/2016   Procedure: IRRIGATION AND DEBRIDEMENT - PARTIAL RESECTION OF ACHILLES TENDON WITH WOUND VAC APPLICATION;  Surgeon: [name], DPM;  Location: [facility] OR[facility];  Service: Podiatry;  Laterality: Left;  . INCISION AND DRAINAGE OF WOUND Left 11/09/2018   Procedure: Excision of  left foot wound with ACell placement;  Surgeon: Wallace Going, DO;  Location: [facility];  Service: Plastics;  Laterality: Left;  . IRRIGATION AND DEBRIDEMENT ABSCESS Left 09/07/2016   Procedure: IRRIGATION AND DEBRIDEMENT ABSCESS LEFT HEEL;  Surgeon: [name], DPM;  Location: [facility] ORS;  Service: Podiatry;  Laterality: Left;  . JOINT REPLACEMENT  2013   left knee replacement  . KIDNEY SURGERY Right    kidney stones  . LITHOTRIPSY    . LOWER EXTREMITY ANGIOGRAPHY Left 04/04/2018   Procedure: LOWER EXTREMITY ANGIOGRAPHY;  Surgeon: [name], MD;  Location: [facility];  Service: Cardiovascular;  Laterality: Left;  . NM GATED MYOVIEW ([place] HX)  February 2017   Likely breast attenuation. LOW RISK study. Normal EF 55-60%.  . OTHER SURGICAL HISTORY     08/2016 or 09/2016 surgery on achilles tenso h/o staph infection heal. Dr. [name]  . removal of left hematoma Left    leg  . REVERSE SHOULDER ARTHROPLASTY Right 10/08/2015   Procedure: REVERSE SHOULDER ARTHROPLASTY;  Surgeon: [name], MD;  Location: [facility] ORS;  Service: Orthopedics;  Laterality: Right;  . SLEEVE GASTROPLASTY    . TONSILLECTOMY    . TOTAL KNEE ARTHROPLASTY Left   . TOTAL SHOULDER REPLACEMENT Right   . TRANSESOPHAGEAL ECHOCARDIOGRAM  08/08/2013   Mild LVH, EF 60-65%. Moderate LA dilation and mild RA dilation. Mild MR with no evidence of stenosis and no evidence of endocarditis. A false chordae is noted.  . TRANSTHORACIC ECHOCARDIOGRAM  08/03/2013   Mild-Moderate concentric LVH, EF 60-65%. Normal diastolic function. Mild LA dilation. Mild MR with possible vegetation  - not confirmed on TEE   . ULNAR NERVE TRANSPOSITION Right 08/06/2016   Procedure: ULNAR NERVE DECOMPRESSION/TRANSPOSITION;  Surgeon: [name], MD;  Location: [facility] ORS;  Service: Orthopedics;  Laterality: Right;  . UPPER GI ENDOSCOPY    . VAGINAL HYSTERECTOMY      Allergies  Allergies  Allergen Reactions  . Morphine And Related Other  (See Comments) and Nausea Only    Patient becomes very confused Other reaction(s): Other (See Comments), Other (See Comments) Patient becomes very confused Patient becomes very confused   . Penicillins Hives, Shortness Of Breath, Swelling and Other (See Comments)    Facial swelling Has patient had a PCN reaction causing immediate rash, facial/tongue/throat swelling, SOB or lightheadedness with hypotension: Yes Has patient had a PCN reaction causing severe rash involving mucus membranes or skin necrosis: Yes Has patient had a PCN reaction that required hospitalization No Has patient had a PCN reaction occurring within the last 10 years: No If all of the above answers are "NO", then may proceed with Cephalosporin use.  Other reaction(s): Other (See Comments), Other (See Comments) Facial swelling Has patient had a PCN reaction causing immediate rash, facial/tongue/throat swelling, SOB or lightheadedness with hypotension: Yes  Has patient had a PCN reaction causing severe rash involving mucus membranes or skin necrosis: Yes Has patient had a PCN reaction that required hospitalization No Has patient had a PCN reaction occurring within the last 10 years: No If all of the above answers are "NO", then may proceed with Cephalosporin use. Facial swelling Has patient had a PCN reaction causing immediate rash, facial/tongue/throat swelling, SOB or lightheadedness with hypotension: Yes Has patient had a PCN reaction causing severe rash involving mucus membranes or skin necrosis: Yes Has patient had a PCN reaction that required hospitalization No Has patient had a PCN reaction occurring within the last 10 years: No If all of the above answers are "NO", then may proceed with Cephalosporin use. Facial swelling Has patient had a PCN reaction causing immediate rash, facial/tongue/throat swelling, SOB or lightheadedness with hypotension: Yes Has patient had a PCN reaction causing severe rash involving mucus  membranes or skin necrosis: Yes Has patient had a PCN reaction that required hospitalization No Has patient had a PCN reaction occurring within the last 10 years: No If all of the above answers are "NO", then may proceed with Cephalosporin use.   . Ambien [Zolpidem]     Hallucination   . Aspirin Hives  . Oxytetracycline Hives    Other reaction(s): Unknown     History of Present Illness    [name] is a 69 y.o. adult with a hx of PAF, DM2, HTN, HLD, obesity, previous DVT, OSA on CPAP.  She was last seen 12/30/18 by Dr. Fletcher Anon via telemedicine.  She follows with Dr. Lucky Cowboy for chronic left foot ulceration.  There was no evidence of PAD on noninvasive Doppler as well as angiogram done in 2020.  She had debridement with subsequent improvement.  She had atypical chest pain in the past and had Lexiscan Myoview 03/2018 was low risk with no evidence of ischemia.  Her PAF has been well controlled on Toprol 50 daily, she had bradycardia on higher doses.  She is anticoagulated with Pradaxa.  She was admitted June 2021 with pneumonia and sepsis.  She had minimally elevated troponins of 120, 170, 143 which were asymptomatic and was presumed to be due to demand ischemia. Echocardiogram while admitted with EF 60 to 65%, no RWMA, RV normal size and function, no significant valvular abnormalities.  She called the office 10/30/2019 noting similar symptoms to when she had sepsis and was very concerned.  Subsequent urine culture with primary care showed klebsiella which was resistant to many antbiotics. She was started on Levaquin which was the only oral option left.  Per documentation from primary care provider she will need to present to ED for IV antibiotics if she does not respond to Levaquin.  She reports symptoms including fatigue, tiredness, tremor which she associates with her recent UTI.  She is very anxious about Parkinson's in the setting of tremor but tells me her tremor has calmed down and  improved.  Encouraged to discuss with her primary care provider if it recurs.  Reports no shortness of breath at rest.  Endorses dyspnea on exertion is stable at baseline.  She has had marked lightheadedness, dizziness and weakness.  Reports urinary frequency and that she often gets diarrhea with antibiotics.  We reviewed the importance of staying hydrated.  She reports no fever, dysuria.  She reports some chest tightness when her daughter and grandadughter argue in front of her.  No chest pain with exertional activity.  Reports no hematuria.  Does note blood  on wiping which she attributes to hemorrhoids.  EKGs/Labs/Other Studies Reviewed:   The following studies were reviewed today:  Echo 08/2019 1. Left ventricular ejection fraction, by estimation, is 60 to 65%. The  left ventricle has normal function. The left ventricle has no regional  wall motion abnormalities. Left ventricular diastolic parameters were  normal.   2. Right ventricular systolic function is normal. The right ventricular  size is normal.   3. The mitral valve is normal in structure. No evidence of mitral valve  regurgitation. No evidence of mitral stenosis.   4. The aortic valve is normal in structure. Aortic valve regurgitation is  not visualized. No aortic stenosis is present.   5. The inferior vena cava is normal in size with <50% respiratory  variability, suggesting right atrial pressure of 8 mmHg.   EKG:  EKG is ordered today.  The ekg ordered today demonstrates sinus rhythm 94 bpm with first-degree AV block PR 248.  Recent Labs: 09/02/2019: B Natriuretic Peptide 384.0 09/14/2019: ALT 11; BUN 46; Creatinine, Ser 1.27; Hemoglobin 10.5; Platelets 307; Potassium 6.0 No hemolysis seen; Sodium 137  Recent Lipid Panel    Component Value Date/Time   CHOL 181 05/12/2018 1028   CHOL 185 10/10/2014 0822   TRIG 95.0 05/12/2018 1028   HDL 82.60 05/12/2018 1028   HDL 94 10/10/2014 0822   CHOLHDL 2 05/12/2018 1028    VLDL 19.0 05/12/2018 1028   LDLCALC 80 05/12/2018 1028   LDLCALC 73 10/10/2014 0822    Home Medications   Current Outpatient Medications on File Prior to Visit  Medication Sig Dispense Refill  . acetaminophen (TYLENOL) 325 MG tablet Take 2 tablets (650 mg total) by mouth every 6 (six) hours as needed for mild pain or moderate pain (or Fever >/= 101).    Marland Kitchen albuterol (PROVENTIL HFA;VENTOLIN HFA) 108 (90 Base) MCG/ACT inhaler Inhale 1-2 puffs into the lungs every 4 (four) hours as needed for wheezing or shortness of breath. (Patient taking differently: Inhale 2 puffs into the lungs every 4 (four) hours as needed for wheezing or shortness of breath. ) 8 g 2  . albuterol (PROVENTIL) (2.5 MG/3ML) 0.083% nebulizer solution Take 3 mLs (2.5 mg total) by nebulization every 6 (six) hours as needed for wheezing or shortness of breath. 150 mL 1  . ALPRAZolam (XANAX) 0.5 MG tablet Take 0.5 tablets (0.25 mg total) by mouth 2 (two) times daily as needed for anxiety. 60 tablet 2  . blood glucose meter kit and supplies KIT Dispense based on patient and insurance preference. Use up to 2 times daily as directed. (FOR ICD-10 E11.9). 1 each 0  . buPROPion (WELLBUTRIN XL) 150 MG 24 hr tablet Take 1 tablet (150 mg total) by mouth daily. 90 tablet 3  . dabigatran (PRADAXA) 150 MG CAPS capsule Take 1 capsule (150 mg total) by mouth 2 (two) times daily. Please call to schedule office visit for further refills. Thank you! 60 capsule 0  . DULoxetine 40 MG CPEP Take 40 mg by mouth daily. 30 capsule 0  . estradiol (ESTRACE) 0.1 MG/GM vaginal cream Place 1 Applicatorful vaginally every Monday, Wednesday, and Friday. Apply 0.62m (pea-sized amount)  just inside the vaginal introitus with a finger-tip on Monday, Wednesday and Friday nights, 42.5 g 11  . fluticasone-salmeterol (ADVAIR HFA) 115-21 MCG/ACT inhaler Inhale 2 puffs into the lungs daily. Rinse mouth thoroughly after use 1 Inhaler 12  . hydrALAZINE (APRESOLINE) 25 MG  tablet Take 1 tablet (25 mg total) by mouth  2 (two) times daily. 120 tablet 11  . hydrochlorothiazide (HYDRODIURIL) 12.5 MG tablet Take 1 tablet (12.5 mg total) by mouth daily. 90 tablet 1  . Lancets (ONETOUCH DELICA PLUS [id number]) MISC TEST UP TO TWICE DAILY    . levocetirizine (XYZAL) 5 MG tablet Take 0.5 tablets (2.5 mg total) by mouth every evening. 45 tablet 3  . levofloxacin (LEVAQUIN) 500 MG tablet Take 500 mg by mouth daily.    Marland Kitchen levothyroxine (SYNTHROID) 175 MCG tablet Take 1 tablet (175 mcg total) by mouth daily before breakfast. 90 tablet 3  . losartan-hydrochlorothiazide (HYZAAR) 50-12.5 MG tablet Take 1 tablet by mouth daily.    . meloxicam (MOBIC) 15 MG tablet Take 15 mg by mouth daily as needed.    . metFORMIN (GLUCOPHAGE) 1000 MG tablet Take 1 tablet (1,000 mg total) by mouth 2 (two) times daily. With food 180 tablet 3  . montelukast (SINGULAIR) 10 MG tablet Take 1 tablet (10 mg total) by mouth at bedtime. 90 tablet 3  . mupirocin ointment (BACTROBAN) 2 % Apply 1 application topically 2 (two) times daily. Skin wounds 30 g 0  . nystatin cream (MYCOSTATIN) Apply 1 application topically 2 (two) times daily. Mouth prn for 7 days as needed 30 g 11  . ondansetron (ZOFRAN) 4 MG tablet Take 1 tablet (4 mg total) by mouth every 8 (eight) hours as needed for nausea or vomiting. 90 tablet 1  . ONETOUCH ULTRA test strip TEST UP TO TWICE DAILY 100 each 1  . pantoprazole (PROTONIX) 40 MG tablet Take by mouth.    . pregabalin (LYRICA) 50 MG capsule Take 1 capsule (50 mg total) by mouth 3 (three) times daily. 90 capsule 5  . TOVIAZ 8 MG TB24 tablet TAKE ONE TABLET BY MOUTH EVERY DAY 90 tablet 3  . Vitamin D, Ergocalciferol, (DRISDOL) 1.25 MG (50000 UNIT) CAPS capsule Take 1 capsule (50,000 Units total) by mouth once a week. 5 capsule   . zinc oxide 20 % ointment Apply 1 application topically as needed for irritation. 56.7 g 0  . HYDROcodone-acetaminophen (NORCO/VICODIN) 5-325 MG tablet Take 1  tablet by mouth every 8 (eight) hours as needed for severe pain. Must last 30 days 90 tablet 0   Current Facility-Administered Medications on File Prior to Visit  Medication Dose Route Frequency Provider Last Rate Last Admin  . ipratropium-albuterol (DUONEB) 0.5-2.5 (3) MG/3ML nebulizer solution 3 mL  3 mL Nebulization Q6H [name], [name], MD         Review of Systems    Review of Systems  Constitutional: Positive for malaise/fatigue. Negative for chills and fever.  Cardiovascular: Positive for dyspnea on exertion. Negative for chest pain, irregular heartbeat, leg swelling, near-syncope, orthopnea, palpitations and syncope.  Respiratory: Negative for cough, shortness of breath and wheezing.   Gastrointestinal: Negative for melena ("blood when wiping"), nausea and vomiting.  Genitourinary: Negative for hematuria.  Neurological: Positive for dizziness, light-headedness and weakness.   All other systems reviewed and are otherwise negative except as noted above.  Physical Exam    VS:  BP 128/80 (BP Location: Left Arm, Patient Position: Sitting, Cuff Size: Normal)   Pulse 94   Ht '5\' 5"'  (1.651 m)   SpO2 97%   BMI 41.93 kg/m  , BMI Body mass index is 41.93 kg/m. GEN: Well nourished, overweight, well developed, in no acute distress. HEENT: normal. Neck: Supple, no JVD, carotid bruits, or masses. Cardiac: RRR, no murmurs, rubs, or gallops. No clubbing, cyanosis, edema.  Radials/PT  2+ and equal bilaterally.  Respiratory:  Respirations regular and unlabored, clear to auscultation bilaterally. GI: Soft, nontender, non-distended. MS: No deformity or atrophy. Skin: Warm and dry, no rash.  Left heel wrap in place. Neuro:  Strength and sensation are intact. Psych: Normal affect.  Assessment & Plan    1. UTI/Fatigue/DOE/Lightheadedness -reports approximately 2-week history of fatigue, dyspnea on exertion, lightheadedness.  Recently diagnosed with UTI by outside provider and today  starting Levaquin as her culture returned with Klebsiella only sensitive to Levaquin otherwise IV medication would be required.  Encouraged to present to ED if she does not have improvement in her symptoms in the next 3 to 5 days as previously recommended by primary care provider.  CBC, BMP today for monitoring.  She has no recurrent atrial for relation and her echo in June showed no evidence of heart failure, low suspicion cardiac in origin.  She was encouraged to remain well-hydrated and make position changes slowly.  As some of her lightheadedness is likely dehydration in the setting of urinary frequency and diarrhea with antibiotics-she is on a probiotic.  2. PAF/chronic anticoagulation -maintaining normal sinus rhythm by EKG today.  Denies any melena, hematuria but does note some blood on wiping which she attributes to hemorrhoids.  CBC today for monitoring to rule out anemia as contributory to fatigue/dyspnea.  3. HTN - BP well controlled. Continue current antihypertensive regimen.   4. OSA -reports compliance to CPAP.  Encouraged continued compliance.  Disposition: Follow up in 4 month(s) with Dr. Fletcher Anon or APP   Loel Dubonnet, NP 11/09/2019, 11:55 AM

## 2019-11-09 NOTE — Patient Instructions (Addendum)
Medication Instructions:  No medication changes.   *If you need a refill on your cardiac medications before your next appointment, please call your pharmacy*  Lab Work: Your physician recommends lab work today: BMP, CBC  If you have labs (blood work) drawn today and your tests are completely normal, you will receive your results only by: Marland Kitchen MyChart Message (if you have MyChart) OR . A paper copy in the mail If you have any lab test that is abnormal or we need to change your treatment, we will call you to review the results.   Testing/Procedures: Your EKG today shows you are maintaining normal sinus rhythm.   Follow-Up: At [facility], you and your health needs are our priority.  As part of our continuing mission to provide you with exceptional heart care, we have created designated Provider Care Teams.  These Care Teams include your primary Cardiologist (physician) and Advanced Practice Providers (APPs -  Physician Assistants and Nurse Practitioners) who all work together to provide you with the care you need, when you need it.   Your next appointment:   4 month(s)  The format for your next appointment:   In Person  Provider:   You may see Kathlyn Sacramento, MD or one of the following Advanced Practice Providers on your designated Care Team:    Murray Hodgkins, NP  Christell Faith, PA-C  Marrianne Mood, PA-C  Laurann Montana, NP  Other Instructions  Recommend staying well hydrated. Your lightheadedness is likely low blood pressure from being dehydrated.  Recommend talking with your primary care provider about home health PT after this UTI has resolved.   If you do not feel improved over the next 3-5 days on the Levaquin, agree with your primary care office recommendation for ED evaluation.

## 2019-11-10 ENCOUNTER — Telehealth: Payer: Self-pay | Admitting: Internal Medicine

## 2019-11-10 ENCOUNTER — Other Ambulatory Visit: Payer: Self-pay | Admitting: Internal Medicine

## 2019-11-10 ENCOUNTER — Telehealth: Payer: Self-pay

## 2019-11-10 DIAGNOSIS — I10 Essential (primary) hypertension: Secondary | ICD-10-CM

## 2019-11-10 LAB — CBC
Hematocrit: 33.9 % — ABNORMAL LOW (ref 34.0–46.6)
Hemoglobin: 10.9 g/dL — ABNORMAL LOW (ref 11.1–15.9)
MCH: 26.1 pg — ABNORMAL LOW (ref 26.6–33.0)
MCHC: 32.2 g/dL (ref 31.5–35.7)
MCV: 81 fL (ref 79–97)
Platelets: 276 10*3/uL (ref 150–450)
RBC: 4.18 x10E6/uL (ref 3.77–5.28)
RDW: 14.7 % (ref 11.7–15.4)
WBC: 6.3 10*3/uL (ref 3.4–10.8)

## 2019-11-10 LAB — BASIC METABOLIC PANEL
BUN/Creatinine Ratio: 16 (ref 12–28)
BUN: 25 mg/dL (ref 8–27)
CO2: 26 mmol/L (ref 20–29)
Calcium: 9.1 mg/dL (ref 8.7–10.3)
Chloride: 98 mmol/L (ref 96–106)
Creatinine, Ser: 1.61 mg/dL — ABNORMAL HIGH (ref 0.57–1.00)
GFR calc Af Amer: 37 mL/min/{1.73_m2} — ABNORMAL LOW (ref >59)
GFR calc non Af Amer: 32 mL/min/{1.73_m2} — ABNORMAL LOW (ref >59)
Glucose: 174 mg/dL — ABNORMAL HIGH (ref 65–99)
Potassium: 4.5 mmol/L (ref 3.5–5.2)
Sodium: 139 mmol/L (ref 134–144)

## 2019-11-10 MED ORDER — AMLODIPINE BESYLATE 2.5 MG PO TABS: 2.5000 mg | ORAL_TABLET | Freq: Every day | ORAL | 3 refills | Status: DC

## 2019-11-10 MED ORDER — LOSARTAN POTASSIUM-HCTZ 50-12.5 MG PO TABS: 1.0000 | ORAL_TABLET | Freq: Every day | ORAL | 3 refills | Status: DC

## 2019-11-10 NOTE — Telephone Encounter (Signed)
Inform pt   Kidney  function elevated  Stop hctz 12.5 alone  Increase water intake 55-64 ounces daily  Stop mobic try tylenol  Add norvasc 2.5 mg for bp instead  Cont losartan hctz 50-12.5 mg qd   sch in person appt when I return we need to check labs again and BP

## 2019-11-10 NOTE — Telephone Encounter (Signed)
Attempted to call patient. LMTCB 11/10/2019   

## 2019-11-10 NOTE — Telephone Encounter (Signed)
-----   Message from Loel Dubonnet, NP sent at 11/10/2019 10:23 AM EDT ----- CBC shows stable hemoglobin. White blood cell count was normal which is great! Normal electrolytes. Kidney function shows mild decline likely due to dehydration and her UTI. Recommend completing her course of Levaquin as prescribed and drinking lots of fluids.   She should have repeat BMP in 4 weeks either with [place] or her primary care provider.

## 2019-11-13 NOTE — Telephone Encounter (Signed)
Patient informed and verbalized understanding.  Scheduled for follow up with fasting labs 12/28/19 at 9:30 am .

## 2019-11-14 NOTE — Telephone Encounter (Signed)
Agree with evaluation given persistent symptoms, worsening symptoms and confusion.

## 2019-11-14 NOTE — Telephone Encounter (Signed)
Patient called back and verified Medications as listed in result note. Advised to stop HCTZ alone 12.5 mg to Stop mobic and to add Norvasc 2.5 mg and continue losartan HCTZ 50-12.5 mg. Patient DPR ( husband ) wrote down directions. Patient sounded confused on phone could not understand medications reason for speaking with Husband. Patient advised once also that she was DX with UTI last week and was prescribed Levaquin at UC they advised if this did not work and patient not feeling better she would need IV antibiotics. Patient has been in bed for two days advised her symptoms no better she needs to be evaluated now to go to UC with Kidney function and UTI she should be evaluated . Patient agreed to go to UC.

## 2019-11-15 NOTE — Telephone Encounter (Signed)
Thank you!  Agree with eval in walk-in clinic. Per urine culture results if not responsive to PO Levaquin she is on will require IV antibiotics. Primary care is following closely. PCP recommended stopping HCTZ 12.5mg , continuing Losartan/HCTZ, starting Norvasc 2.5mg , stopping Mobic. (Listing for reference during our next appt).    Loel Dubonnet, NP

## 2019-11-15 NOTE — Telephone Encounter (Signed)
Patient returning call.

## 2019-11-15 NOTE — Telephone Encounter (Signed)
Call to patient to review labs.    Spoke to husband, okay per DPR. He verbalized understanding and reported that medications had been changed by PCP in response to kidney function. He reports that she is still having issues urinating and is going to walk in clinic today. Husband reports hx of multiple kidney stones. I voiced agreement with POC. I suggested follow up appt in a few weeks to repeat labs and update medication list as husband struggles with understanding changes. Encouraged them to bring medicine bottles to appt in 2 weeks.    Advised pt to call for any further questions or concerns.  No further orders.

## 2019-11-17 ENCOUNTER — Other Ambulatory Visit: Payer: Self-pay

## 2019-11-17 ENCOUNTER — Encounter: Payer: Self-pay | Admitting: Physician Assistant

## 2019-11-17 ENCOUNTER — Ambulatory Visit (INDEPENDENT_AMBULATORY_CARE_PROVIDER_SITE_OTHER): Payer: PPO | Admitting: Physician Assistant

## 2019-11-17 VITALS — Ht 65.0 in | Wt 252.0 lb

## 2019-11-17 DIAGNOSIS — R8271 Bacteriuria: Secondary | ICD-10-CM

## 2019-11-17 DIAGNOSIS — Z87442 Personal history of urinary calculi: Secondary | ICD-10-CM

## 2019-11-17 DIAGNOSIS — R109 Unspecified abdominal pain: Secondary | ICD-10-CM | POA: Diagnosis not present

## 2019-11-17 LAB — BLADDER SCAN AMB NON-IMAGING

## 2019-11-17 NOTE — Progress Notes (Signed)
 11/17/2019 3:15 PM   [name] 11/15/1950 [id number]  CC: Chief Complaint  Patient presents with  . Dysuria    HPI: [name] is a 69 y.o. comorbid female including multidrug-resistant urinary colonization and mixed incontinence who presents today for evaluation of possible UTI.  Patient was recently ill with symptoms including weakness, chills, fatigue, sore throat, and cough.  Covid test was negative.  She was seen by her PCP on 11/01/2019.  Despite denying urinary symptoms, UA was obtained and she was ultimately treated with Levaquin for multidrug-resistant Klebsiella positive urine culture.  Today, she reports that her symptoms of fatigue improved on Levaquin but have slightly worsened since completing this medication.  She denies dysuria, urgency, frequency, and lower abdominal pain.  She does have back pain and is concerned that it may be associated with her kidneys with her history of nephrolithiasis.  She wants to know if her urinalysis today shows resolution of her infection.  PVR 25mL.  PMH: Past Medical History:  Diagnosis Date  . Abnormal antibody titer   . Anxiety and depression   . Arthritis   . Asthma   . Cystocele   . Depression   . Diabetes (HCC)   . DVT (deep venous thrombosis) (HCC)    Righ calf  . Dyspnea    11/08/2018 - "more now due to lack of exercise"  . Dysrhythmia    afib  . Edema   . GERD (gastroesophageal reflux disease)   . Heart murmur    Echocardiogram June 2015: Mild MR (possible vegetation seen on TTE, not seen on TEE), normal LV size with moderate concentric LVH. Normal function EF 60-65%. Normal diastolic function. Mild LA dilation.  . History of IBS   . History of kidney stones   . History of methicillin resistant staphylococcus aureus (MRSA) 2008  . Hyperlipidemia   . Hypertension   . Hypothyroidism   . Insomnia   . Kidney stone    kidney stones with lithotripsy .  [facility]- "adrenal glands"  . Neuropathy  involving both lower extremities   . Obesity, Class II, BMI 35-39.9, with comorbidity   . Paroxysmal atrial fibrillation (HCC) 2007   In June 2015, Cardiac Event Monitor: Mostly SR/sinus arrhythmia with PVCs that are frequent. Short bursts of A. fib lasting several minutes;; CHA2DS2-VASc Score = 5 (age, Female, PVD, DM, HTN)  . Peripheral vascular disease (HCC)   . Sleep apnea    use C-PAP  . Urinary incontinence   . Venous stasis dermatitis of both lower extremities     Surgical History: Past Surgical History:  Procedure Laterality Date  . ANKLE SURGERY Right   . APPLICATION OF WOUND VAC Left 06/27/2015   Procedure: APPLICATION OF WOUND VAC ( POSSIBLE ) ;  Surgeon: [name] S [name], MD;  Location: [facility];  Service: Vascular;  Laterality: Left;  . APPLICATION OF WOUND VAC Left 09/25/2016   Procedure: APPLICATION OF WOUND VAC;  Surgeon: Troxler, Matthew, DPM;  Location: [facility];  Service: Podiatry;  Laterality: Left;  . arm surgery     right    . CHOLECYSTECTOMY    . COLONOSCOPY    . COLONOSCOPY WITH PROPOFOL N/A 01/11/2017   Procedure: COLONOSCOPY WITH PROPOFOL;  Surgeon: Skulskie, Martin U, MD;  Location: [facility];  Service: Endoscopy;  Laterality: N/A;  . COLONOSCOPY WITH PROPOFOL N/A 02/22/2017   Procedure: COLONOSCOPY WITH PROPOFOL;  Surgeon: Skulskie, Martin U, MD;  Location: [facility];  Service: Endoscopy;  Laterality:   N/A;  . COLONOSCOPY WITH PROPOFOL N/A 05/31/2017   Procedure: COLONOSCOPY WITH PROPOFOL;  Surgeon: Skulskie, Martin U, MD;  Location: [facility];  Service: Endoscopy;  Laterality: N/A;  . ESOPHAGOGASTRODUODENOSCOPY (EGD) WITH PROPOFOL N/A 01/11/2017   Procedure: ESOPHAGOGASTRODUODENOSCOPY (EGD) WITH PROPOFOL;  Surgeon: Skulskie, Martin U, MD;  Location: [facility];  Service: Endoscopy;  Laterality: N/A;  . ESOPHAGOGASTRODUODENOSCOPY (EGD) WITH PROPOFOL N/A 10/25/2018   Procedure: ESOPHAGOGASTRODUODENOSCOPY (EGD) WITH PROPOFOL;  Surgeon: Skulskie, Martin  U, MD;  Location: [facility];  Service: Endoscopy;  Laterality: N/A;  . HERNIA REPAIR     umbilical  . HIATAL HERNIA REPAIR    . I & D EXTREMITY Left 06/27/2015   Procedure: IRRIGATION AND DEBRIDEMENT EXTREMITY            ( CALF HEMATOMA ) POSSIBLE WOUND VAC;  Surgeon: [name] S [name], MD;  Location: [facility];  Service: Vascular;  Laterality: Left;  . INCISION AND DRAINAGE OF WOUND Left 09/25/2016   Procedure: IRRIGATION AND DEBRIDEMENT - PARTIAL RESECTION OF ACHILLES TENDON WITH WOUND VAC APPLICATION;  Surgeon: Troxler, Matthew, DPM;  Location: [facility];  Service: Podiatry;  Laterality: Left;  . INCISION AND DRAINAGE OF WOUND Left 11/09/2018   Procedure: Excision of left foot wound with ACell placement;  Surgeon: Dillingham, Claire S, DO;  Location: [facility];  Service: Plastics;  Laterality: Left;  . IRRIGATION AND DEBRIDEMENT ABSCESS Left 09/07/2016   Procedure: IRRIGATION AND DEBRIDEMENT ABSCESS LEFT HEEL;  Surgeon: Troxler, Matthew, DPM;  Location: [facility];  Service: Podiatry;  Laterality: Left;  . JOINT REPLACEMENT  2013   left knee replacement  . KIDNEY SURGERY Right    kidney stones  . LITHOTRIPSY    . LOWER EXTREMITY ANGIOGRAPHY Left 04/04/2018   Procedure: LOWER EXTREMITY ANGIOGRAPHY;  Surgeon: [name], [name] S, MD;  Location: [facility];  Service: Cardiovascular;  Laterality: Left;  . NM GATED MYOVIEW ([facility])  February 2017   Likely breast attenuation. LOW RISK study. Normal EF 55-60%.  . OTHER SURGICAL HISTORY     08/2016 or 09/2016 surgery on achilles tenso h/o staph infection heal. Dr. Troxler  . removal of left hematoma Left    leg  . REVERSE SHOULDER ARTHROPLASTY Right 10/08/2015   Procedure: REVERSE SHOULDER ARTHROPLASTY;  Surgeon: John J Poggi, MD;  Location: [facility];  Service: Orthopedics;  Laterality: Right;  . SLEEVE GASTROPLASTY    . TONSILLECTOMY    . TOTAL KNEE ARTHROPLASTY Left   . TOTAL SHOULDER REPLACEMENT Right   . TRANSESOPHAGEAL ECHOCARDIOGRAM  08/08/2013    Mild LVH, EF 60-65%. Moderate LA dilation and mild RA dilation. Mild MR with no evidence of stenosis and no evidence of endocarditis. A false chordae is noted.  . TRANSTHORACIC ECHOCARDIOGRAM  08/03/2013   Mild-Moderate concentric LVH, EF 60-65%. Normal diastolic function. Mild LA dilation. Mild MR with possible vegetation  - not confirmed on TEE   . ULNAR NERVE TRANSPOSITION Right 08/06/2016   Procedure: ULNAR NERVE DECOMPRESSION/TRANSPOSITION;  Surgeon: Poggi, John J, MD;  Location: [facility];  Service: Orthopedics;  Laterality: Right;  . UPPER GI ENDOSCOPY    . VAGINAL HYSTERECTOMY      Home Medications:  Allergies as of 11/17/2019      Reactions   Morphine And Related Other (See Comments), Nausea Only   Patient becomes very confused Other reaction(s): Other (See Comments), Other (See Comments) Patient becomes very confused Patient becomes very confused   Penicillins Hives, Shortness Of Breath, Swelling, Other (See Comments)     Facial swelling Has patient had a PCN reaction causing immediate rash, facial/tongue/throat swelling, SOB or lightheadedness with hypotension: Yes Has patient had a PCN reaction causing severe rash involving mucus membranes or skin necrosis: Yes Has patient had a PCN reaction that required hospitalization No Has patient had a PCN reaction occurring within the last 10 years: No If all of the above answers are "NO", then may proceed with Cephalosporin use. Other reaction(s): Other (See Comments), Other (See Comments) Facial swelling Has patient had a PCN reaction causing immediate rash, facial/tongue/throat swelling, SOB or lightheadedness with hypotension: Yes Has patient had a PCN reaction causing severe rash involving mucus membranes or skin necrosis: Yes Has patient had a PCN reaction that required hospitalization No Has patient had a PCN reaction occurring within the last 10 years: No If all of the above answers are "NO", then may proceed with Cephalosporin  use. Facial swelling Has patient had a PCN reaction causing immediate rash, facial/tongue/throat swelling, SOB or lightheadedness with hypotension: Yes Has patient had a PCN reaction causing severe rash involving mucus membranes or skin necrosis: Yes Has patient had a PCN reaction that required hospitalization No Has patient had a PCN reaction occurring within the last 10 years: No If all of the above answers are "NO", then may proceed with Cephalosporin use. Facial swelling Has patient had a PCN reaction causing immediate rash, facial/tongue/throat swelling, SOB or lightheadedness with hypotension: Yes Has patient had a PCN reaction causing severe rash involving mucus membranes or skin necrosis: Yes Has patient had a PCN reaction that required hospitalization No Has patient had a PCN reaction occurring within the last 10 years: No If all of the above answers are "NO", then may proceed with Cephalosporin use.   Ambien [zolpidem]    Hallucination    Aspirin Hives   Oxytetracycline Hives   Other reaction(s): Unknown      Medication List       Accurate as of November 17, 2019  3:15 PM. If you have any questions, ask your nurse or doctor.        acetaminophen 325 MG tablet Commonly known as: TYLENOL Take 2 tablets (650 mg total) by mouth every 6 (six) hours as needed for mild pain or moderate pain (or Fever >/= 101).   Advair HFA 115-21 MCG/ACT inhaler Generic drug: fluticasone-salmeterol Inhale 2 puffs into the lungs daily. Rinse mouth thoroughly after use   albuterol (2.5 MG/3ML) 0.083% nebulizer solution Commonly known as: PROVENTIL Take 3 mLs (2.5 mg total) by nebulization every 6 (six) hours as needed for wheezing or shortness of breath. What changed: Another medication with the same name was changed. Make sure you understand how and when to take each.   albuterol 108 (90 Base) MCG/ACT inhaler Commonly known as: VENTOLIN HFA Inhale 1-2 puffs into the lungs every 4 (four)  hours as needed for wheezing or shortness of breath. What changed: how much to take   ALPRAZolam 0.5 MG tablet Commonly known as: XANAX Take 0.5 tablets (0.25 mg total) by mouth 2 (two) times daily as needed for anxiety.   amLODipine 2.5 MG tablet Commonly known as: NORVASC Take 1 tablet (2.5 mg total) by mouth daily. D/c hctz 12.5 alone   blood glucose meter kit and supplies Kit Dispense based on patient and insurance preference. Use up to 2 times daily as directed. (FOR ICD-10 E11.9).   buPROPion 150 MG 24 hr tablet Commonly known as: WELLBUTRIN XL Take 1 tablet (150 mg total) by mouth daily.  dabigatran 150 MG Caps capsule Commonly known as: Pradaxa Take 1 capsule (150 mg total) by mouth 2 (two) times daily. Please call to schedule office visit for further refills. Thank you!   DULoxetine HCl 40 MG Cpep Take 40 mg by mouth daily.   estradiol 0.1 MG/GM vaginal cream Commonly known as: ESTRACE Place 1 Applicatorful vaginally every Monday, Wednesday, and Friday. Apply 0.5mg (pea-sized amount)  just inside the vaginal introitus with a finger-tip on Monday, Wednesday and Friday nights,   hydrALAZINE 25 MG tablet Commonly known as: APRESOLINE Take 1 tablet (25 mg total) by mouth 2 (two) times daily.   HYDROcodone-acetaminophen 5-325 MG tablet Commonly known as: NORCO/VICODIN Take 1 tablet by mouth every 8 (eight) hours as needed for severe pain. Must last 30 days   levocetirizine 5 MG tablet Commonly known as: XYZAL Take 0.5 tablets (2.5 mg total) by mouth every evening.   levofloxacin 500 MG tablet Commonly known as: LEVAQUIN Take 500 mg by mouth daily.   levothyroxine 175 MCG tablet Commonly known as: SYNTHROID Take 1 tablet (175 mcg total) by mouth daily before breakfast.   losartan-hydrochlorothiazide 50-12.5 MG tablet Commonly known as: HYZAAR Take 1 tablet by mouth daily. In am. D/c hctz 12.5 alone and d/c mobic use tylenol for pain as needed   metFORMIN 1000  MG tablet Commonly known as: GLUCOPHAGE Take 1 tablet (1,000 mg total) by mouth 2 (two) times daily. With food   metoprolol succinate 50 MG 24 hr tablet Commonly known as: TOPROL-XL TAKE 1 TABLET BY MOUTH DAILY   montelukast 10 MG tablet Commonly known as: SINGULAIR Take 1 tablet (10 mg total) by mouth at bedtime.   mupirocin ointment 2 % Commonly known as: Bactroban Apply 1 application topically 2 (two) times daily. Skin wounds   nystatin cream Commonly known as: MYCOSTATIN Apply 1 application topically 2 (two) times daily. Mouth prn for 7 days as needed   ondansetron 4 MG tablet Commonly known as: Zofran Take 1 tablet (4 mg total) by mouth every 8 (eight) hours as needed for nausea or vomiting.   OneTouch Delica Plus Lancet30G Misc TEST UP TO TWICE DAILY   OneTouch Ultra test strip Generic drug: glucose blood TEST UP TO TWICE DAILY   pantoprazole 40 MG tablet Commonly known as: PROTONIX Take by mouth.   pregabalin 50 MG capsule Commonly known as: LYRICA Take 1 capsule (50 mg total) by mouth 3 (three) times daily.   Toviaz 8 MG Tb24 tablet Generic drug: fesoterodine TAKE ONE TABLET BY MOUTH EVERY DAY   Vitamin D (Ergocalciferol) 1.25 MG (50000 UNIT) Caps capsule Commonly known as: DRISDOL Take 1 capsule (50,000 Units total) by mouth once a week.   zinc oxide 20 % ointment Apply 1 application topically as needed for irritation.       Allergies:  Allergies  Allergen Reactions  . Morphine And Related Other (See Comments) and Nausea Only    Patient becomes very confused Other reaction(s): Other (See Comments), Other (See Comments) Patient becomes very confused Patient becomes very confused   . Penicillins Hives, Shortness Of Breath, Swelling and Other (See Comments)    Facial swelling Has patient had a PCN reaction causing immediate rash, facial/tongue/throat swelling, SOB or lightheadedness with hypotension: Yes Has patient had a PCN reaction causing  severe rash involving mucus membranes or skin necrosis: Yes Has patient had a PCN reaction that required hospitalization No Has patient had a PCN reaction occurring within the last 10 years: No If all   of the above answers are "NO", then may proceed with Cephalosporin use.  Other reaction(s): Other (See Comments), Other (See Comments) Facial swelling Has patient had a PCN reaction causing immediate rash, facial/tongue/throat swelling, SOB or lightheadedness with hypotension: Yes Has patient had a PCN reaction causing severe rash involving mucus membranes or skin necrosis: Yes Has patient had a PCN reaction that required hospitalization No Has patient had a PCN reaction occurring within the last 10 years: No If all of the above answers are "NO", then may proceed with Cephalosporin use. Facial swelling Has patient had a PCN reaction causing immediate rash, facial/tongue/throat swelling, SOB or lightheadedness with hypotension: Yes Has patient had a PCN reaction causing severe rash involving mucus membranes or skin necrosis: Yes Has patient had a PCN reaction that required hospitalization No Has patient had a PCN reaction occurring within the last 10 years: No If all of the above answers are "NO", then may proceed with Cephalosporin use. Facial swelling Has patient had a PCN reaction causing immediate rash, facial/tongue/throat swelling, SOB or lightheadedness with hypotension: Yes Has patient had a PCN reaction causing severe rash involving mucus membranes or skin necrosis: Yes Has patient had a PCN reaction that required hospitalization No Has patient had a PCN reaction occurring within the last 10 years: No If all of the above answers are "NO", then may proceed with Cephalosporin use.   . Ambien [Zolpidem]     Hallucination   . Aspirin Hives  . Oxytetracycline Hives    Other reaction(s): Unknown     Family History: Family History  Problem Relation Age of Onset  . Skin cancer Father    . Diabetes Father   . Hypertension Father   . Peripheral vascular disease Father   . Cancer Father   . Cerebral aneurysm Father   . Alcohol abuse Father   . Varicose Veins Mother   . Kidney disease Mother   . Arthritis Mother   . Mental illness Sister   . Cancer Maternal Aunt        breast  . Breast cancer Maternal Aunt   . Arthritis Maternal Grandmother   . Hypertension Maternal Grandmother   . Diabetes Maternal Grandmother   . Arthritis Maternal Grandfather   . Heart disease Maternal Grandfather   . Hypertension Maternal Grandfather   . Arthritis Paternal Grandmother   . Hypertension Paternal Grandmother   . Diabetes Paternal Grandmother   . Arthritis Paternal Grandfather   . Hypertension Paternal Grandfather   . Bladder Cancer Neg Hx   . Kidney cancer Neg Hx     Social History:   reports that she has never smoked. She has never used smokeless tobacco. She reports that she does not drink alcohol and does not use drugs.  Physical Exam: Ht 5' 5" (1.651 m)   Wt 252 lb (114.3 kg)   BMI 41.93 kg/m   Constitutional:  Alert and oriented, no acute distress, nontoxic appearing HEENT: [place], AT Cardiovascular: No clubbing, cyanosis, or edema Respiratory: Normal respiratory effort, no increased work of breathing Skin: No rashes, bruises or suspicious lesions Neurologic: Grossly intact, no focal deficits, moving all 4 extremities Psychiatric: Normal mood and affect  Laboratory Data: Results for orders placed or performed in visit on 11/17/19  Bladder Scan (Post Void Residual) in office  Result Value Ref Range   Scan Result 25mL    Assessment & Plan:   69-year-old comorbid female with a history of mixed incontinence and multidrug-resistant bacterial urinary colonization recently treated   for a positive urine culture despite no urinary symptoms presents today with ongoing fatigue following a recent illness.  1. Chronic bacteriuria I had a long conversation with the patient  and her husband again today regarding the nature of her known multidrug-resistant urinary colonization.  I explained that I fully expect every urine culture she completes to be positive for multidrug-resistant organisms.  I explained that in the absence of infective symptoms including lower abdominal pain, dysuria, urgency, frequency, flank pain, nausea, vomiting, fever, and/or chills, there is no indication to treat this bacteria.  Moreover, prescribing more antibiotics to treat this benign colonization will only worsen her antibiotic resistance and make it more difficult to treat her if and when she does become truly infected.  In the absence of urinary infective symptoms today, I am not repeating a UA or culture at this time.  I encouraged her to follow-up with her primary care provider for further evaluation of alternative sources of her ongoing fatigue, as I do not believe urinary tract infection is the source of her isolated fatigue. - Bladder Scan (Post Void Residual) in office  2. Flank pain with history of urolithiasis I will obtain a CT stone study to reevaluate her stone burden and contact her with results. - CT RENAL STONE STUDY   Return if symptoms worsen or fail to improve.   , PA-C  [facility] [address], [address] [place], [place] [address] [phone]    

## 2019-11-20 DIAGNOSIS — E1142 Type 2 diabetes mellitus with diabetic polyneuropathy: Secondary | ICD-10-CM | POA: Diagnosis not present

## 2019-11-20 DIAGNOSIS — L97521 Non-pressure chronic ulcer of other part of left foot limited to breakdown of skin: Secondary | ICD-10-CM | POA: Diagnosis not present

## 2019-11-22 ENCOUNTER — Ambulatory Visit
Admission: RE | Admit: 2019-11-22 | Discharge: 2019-11-22 | Disposition: A | Discharge status: Home / Self Care | Payer: PPO | Source: Ambulatory Visit | Attending: Student in an Organized Health Care Education/Training Program | Admitting: Student in an Organized Health Care Education/Training Program

## 2019-11-22 ENCOUNTER — Encounter: Payer: Self-pay | Admitting: Student in an Organized Health Care Education/Training Program

## 2019-11-22 ENCOUNTER — Telehealth: Payer: Self-pay | Admitting: Internal Medicine

## 2019-11-22 ENCOUNTER — Ambulatory Visit (HOSPITAL_BASED_OUTPATIENT_CLINIC_OR_DEPARTMENT_OTHER): Payer: PPO | Admitting: Student in an Organized Health Care Education/Training Program

## 2019-11-22 ENCOUNTER — Ambulatory Visit: Payer: PPO | Admitting: Pain Medicine

## 2019-11-22 ENCOUNTER — Other Ambulatory Visit: Payer: Self-pay

## 2019-11-22 VITALS — BP 150/85 | HR 63 | Temp 97.1°F | Resp 16 | Ht 65.0 in | Wt 252.0 lb

## 2019-11-22 DIAGNOSIS — G8929 Other chronic pain: Secondary | ICD-10-CM | POA: Insufficient documentation

## 2019-11-22 DIAGNOSIS — E1351 Other specified diabetes mellitus with diabetic peripheral angiopathy without gangrene: Secondary | ICD-10-CM

## 2019-11-22 DIAGNOSIS — Z79899 Other long term (current) drug therapy: Secondary | ICD-10-CM | POA: Insufficient documentation

## 2019-11-22 DIAGNOSIS — Z7901 Long term (current) use of anticoagulants: Secondary | ICD-10-CM | POA: Insufficient documentation

## 2019-11-22 DIAGNOSIS — G894 Chronic pain syndrome: Secondary | ICD-10-CM | POA: Insufficient documentation

## 2019-11-22 DIAGNOSIS — G5711 Meralgia paresthetica, right lower limb: Secondary | ICD-10-CM | POA: Insufficient documentation

## 2019-11-22 DIAGNOSIS — M79661 Pain in right lower leg: Secondary | ICD-10-CM | POA: Diagnosis not present

## 2019-11-22 DIAGNOSIS — M797 Fibromyalgia: Secondary | ICD-10-CM | POA: Insufficient documentation

## 2019-11-22 DIAGNOSIS — M7918 Myalgia, other site: Secondary | ICD-10-CM

## 2019-11-22 DIAGNOSIS — M79604 Pain in right leg: Secondary | ICD-10-CM

## 2019-11-22 DIAGNOSIS — E1142 Type 2 diabetes mellitus with diabetic polyneuropathy: Secondary | ICD-10-CM | POA: Insufficient documentation

## 2019-11-22 DIAGNOSIS — M8949 Other hypertrophic osteoarthropathy, multiple sites: Secondary | ICD-10-CM

## 2019-11-22 DIAGNOSIS — M159 Polyosteoarthritis, unspecified: Secondary | ICD-10-CM

## 2019-11-22 MED ORDER — PREGABALIN 50 MG PO CAPS: 50.0000 mg | ORAL_CAPSULE | Freq: Three times a day (TID) | ORAL | 5 refills | Status: DC

## 2019-11-22 MED ORDER — HYDROCODONE-ACETAMINOPHEN 5-325 MG PO TABS: 1.0000 | ORAL_TABLET | Freq: Three times a day (TID) | ORAL | 0 refills | Status: DC | PRN

## 2019-11-22 MED ORDER — DULOXETINE HCL 40 MG PO CPEP: 40.0000 mg | ORAL_CAPSULE | Freq: Every day | ORAL | 5 refills | Status: DC

## 2019-11-22 NOTE — Telephone Encounter (Signed)
I spoke with Dr. Holley Raring and he stated that he has ordered the [place] but would like for PCP to follow up on it.

## 2019-11-22 NOTE — Telephone Encounter (Signed)
Dr. Holley Raring office called  Pt is in their office now and is having right leg pain with bruising and leg tightness below the knee  They are wanting her scheduled to have an Ultrasound

## 2019-11-22 NOTE — Progress Notes (Signed)
PROVIDER NOTE: Information contained herein reflects review and annotations entered in association with encounter. Interpretation of such information and data should be left to medically-trained personnel. Information provided to patient can be located elsewhere in the medical record under "Patient Instructions". Document created using STT-dictation technology, any transcriptional errors that may result from process are unintentional.    Patient: [name]  Service Category: E/M  Provider: Gillis Santa, MD  DOB: 1951-03-11  DOS: 11/22/2019  Specialty: Interventional Pain Management  MRN: [id number]  Setting: Ambulatory outpatient  PCP: [name], [name], MD  Type: Established Patient    Referring Provider: Orland Mustard *  Location: Office  Delivery: Face-to-face     HPI  Reason for encounter: [name], a 69 y.o. year old adult, is here today for evaluation and management of her Diabetic peripheral neuropathy associated with type 2 diabetes mellitus ([place]) [E11.42]. [name] primary complain today is Leg Pain (perpheral neuropathy ) and Foot Pain (healing wounds) Last encounter: Practice (07/04/2019). My last encounter with her was on Visit date not found. Pertinent problems: [name] has Paroxysmal atrial fibrillation ([place]); CHA2DS2-Vasc Score 5. On Pradaxa; Diabetic peripheral neuropathy associated with type 2 diabetes mellitus ([place]); Neuropathy of right lateral femoral cutaneous nerve; Morbid obesity with BMI of 40.0-44.9, adult ([place]); Chronic prescription benzodiazepine use; Osteoarthritis involving multiple joints; Chronic pain syndrome; and Meralgia paresthetica of right side on their pertinent problem list. Pain Assessment: Severity of Chronic pain is reported as a 6 /10. Location: Leg Left, Right/denies. Onset: More than a month ago. Quality: Discomfort, Constant (bruising noted on the back of legs above the knee. denies any falls or bumping into anything.   states it is spreading further up the leg). Timing: Constant. Modifying factor(s): lyrica, pan medications, cymbalta. Vitals:  height is '5\' 5"'  (1.651 m) and weight is 252 lb (114.3 kg). Her temporal temperature is 97.1 F (36.2 C) (abnormal). Her blood pressure is 150/85 (abnormal) and her pulse is 63. Her respiration is 16 and oxygen saturation is 88% (abnormal).   [name] is a pleasant 69 year old old female who presents for medication management today.  She is a patient of my partner, Dr. Dossie Arbour who I am filling in for today.  She is here today to refill her hydrocodone.  She is endorsing right leg pain, right calf swelling that extends to her distal thigh.  She does have slight bruising of this area.  This is my first time seeing this patient so I do not have a previous physical exam to compare to.  Patient is on Pradaxa for atrial fibrillation.  She states that she has attempted to contact her primary care provider multiple times this week but has not been able to get in contact with them.  I will place a stat order for right lower extremity Doppler to rule out DVT.  I have also contacted the patient's primary care provider's clinic to inform them of the patient's acute right lower extremity pain, edema, tightness for them to follow.  I spoke to [place].  Pharmacotherapy Assessment   10/13/2019  1   08/30/2019  Hydrocodone-Acetamin 5-325 MG  90.00  30 Fr Nav   [id number]   Thr (4878)   0/0  15.00 MME  Medicaid   [place]     Analgesic:Norco 5 mg TID PRN #30/month MME 15 Monitoring: [place] PMP: PDMP not reviewed this encounter.       Pharmacotherapy: No side-effects or adverse reactions reported. Compliance: No problems identified. Effectiveness: Clinically  acceptable.  Janett Billow, RN  11/22/2019 12:24 PM  Sign when Signing Visit Nursing Pain Medication Assessment:  Safety precautions to be maintained throughout the outpatient stay will include: orient to surroundings, keep bed in low position,  maintain call bell within reach at all times, provide assistance with transfer out of bed and ambulation.  Medication Inspection Compliance: Pill count conducted under aseptic conditions, in front of the patient. Neither the pills nor the bottle was removed from the patient's sight at any time. Once count was completed pills were immediately returned to the patient in their original bottle.  Medication: Hydrocodone/APAP Pill/Patch Count: 14 of 90 pills remain Pill/Patch Appearance: Markings consistent with prescribed medication Bottle Appearance: Standard pharmacy container. Clearly labeled. Filled Date: 07 /23 / 2021 Last Medication intake:  Today    UDS:  Summary  Date Value Ref Range Status  05/19/2018 FINAL  Final    Comment:    ==================================================================== TOXASSURE SELECT 13 (MW) ==================================================================== Test                             Result       Flag       Units Drug Present and Declared for Prescription Verification   Alprazolam                     111          EXPECTED   ng/mg creat   Alpha-hydroxyalprazolam        162          EXPECTED   ng/mg creat    Source of alprazolam is a scheduled prescription medication.    Alpha-hydroxyalprazolam is an expected metabolite of alprazolam.   Hydrocodone                    560          EXPECTED   ng/mg creat   Hydromorphone                  30           EXPECTED   ng/mg creat   Dihydrocodeine                 86           EXPECTED   ng/mg creat   Norhydrocodone                 943          EXPECTED   ng/mg creat    Sources of hydrocodone include scheduled prescription    medications. Hydromorphone, dihydrocodeine and norhydrocodone are    expected metabolites of hydrocodone. Hydromorphone and    dihydrocodeine are also available as scheduled prescription    medications. ==================================================================== Test                       Result    Flag   Units      Ref Range   Creatinine              206              mg/dL      >=20 ==================================================================== Declared Medications:  The flagging and interpretation on this report are based on the  following declared medications.  Unexpected results may arise from  inaccuracies in the declared medications.  **Note: The testing scope of this panel  includes these medications:  Alprazolam  Hydrocodone (Hydrocodone-Acetaminophen)  **Note: The testing scope of this panel does not include following  reported medications:  Acetaminophen (Hydrocodone-Acetaminophen)  Albuterol  Amlodipine Besylate  Bupropion  Calcium citrate (Calcium citrate/Vitamin D)  Clobetasol  Dabigatran  Dexlansoprazole  Duloxetine  Estradiol  Fesoterodine  Fluticasone (Advair)  Gabapentin  Hydrochlorothiazide  Levocetirizine  Levothyroxine  Losartan (Losartan Potassium)  Metformin  Metoprolol  Montelukast  Mupirocin  Nystatin  Ondansetron  Oxybutynin  Pregabalin  Salmeterol (Advair)  Tiotropium  Topical Diclofenac  Vitamin D (Calcium citrate/Vitamin D)  Zinc Oxide ==================================================================== For clinical consultation, please call [phone]. ====================================================================      ROS  Constitutional: Denies any fever or chills Gastrointestinal: No reported hemesis, hematochezia, vomiting, or acute GI distress Musculoskeletal: Low back, bilateral hip, right calf, distal thigh pain. Neurological: No reported episodes of acute onset apraxia, aphasia, dysarthria, agnosia, amnesia, paralysis, loss of coordination, or loss of consciousness  Medication Review  ALPRAZolam, DULoxetine HCl, HYDROcodone-acetaminophen, OneTouch Delica Plus [id number], Vitamin D (Ergocalciferol), acetaminophen, albuterol, amLODipine, blood glucose meter kit and supplies, buPROPion,  dabigatran, estradiol, fesoterodine, fluticasone-salmeterol, glucose blood, levocetirizine, levofloxacin, levothyroxine, losartan-hydrochlorothiazide, metFORMIN, metoprolol succinate, montelukast, mupirocin ointment, nystatin cream, ondansetron, pantoprazole, pregabalin, and zinc oxide  History Review  Allergy: Ms. Ryder is allergic to morphine and related, penicillins, ambien [zolpidem], aspirin, and oxytetracycline. Drug: Ms. Name  reports no history of drug use. Alcohol:  reports no history of alcohol use. Tobacco:  reports that she has never smoked. She has never used smokeless tobacco. Social: Ms. Woolford  reports that she has never smoked. She has never used smokeless tobacco. She reports that she does not drink alcohol and does not use drugs. Medical:  has a past medical history of Abnormal antibody titer, Anxiety and depression, Arthritis, Asthma, Cystocele, Depression, Diabetes ([place]), DVT (deep venous thrombosis) ([place]), Dyspnea, Dysrhythmia, Edema, GERD (gastroesophageal reflux disease), Heart murmur, History of IBS, History of kidney stones, History of methicillin resistant staphylococcus aureus (MRSA) (2008), Hyperlipidemia, Hypertension, Hypothyroidism, Insomnia, Kidney stone, Neuropathy involving both lower extremities, Obesity, Class II, BMI 35-39.9, with comorbidity, Paroxysmal atrial fibrillation ([place]) (2007), Peripheral vascular disease ([place]), Sleep apnea, Urinary incontinence, and Venous stasis dermatitis of both lower extremities. Surgical: Ms. Egnew  has a past surgical history that includes Ankle surgery (Right); Vaginal hysterectomy; Sleeve Gastroplasty; Lithotripsy; Kidney surgery (Right); transthoracic echocardiogram (08/03/2013); transesophageal echocardiogram (08/08/2013); Tonsillectomy; Total knee arthroplasty (Left); Hiatal hernia repair; Cholecystectomy; I & D extremity (Left, 06/27/2015); Application if wound vac (Left, 06/27/2015); removal of left hematoma (Left); NM GATED  MYOVIEW [facility] HX) (February 2017); Reverse shoulder arthroplasty (Right, 10/08/2015); Hernia repair; Ulnar nerve transposition (Right, 08/06/2016); arm surgery; Irrigation and debridement abscess (Left, 09/07/2016); Incision and drainage of wound (Left, 09/25/2016); Application if wound vac (Left, 09/25/2016); Colonoscopy; Upper gi endoscopy; Esophagogastroduodenoscopy (egd) with propofol (N/A, 01/11/2017); Colonoscopy with propofol (N/A, 01/11/2017); Colonoscopy with propofol (N/A, 02/22/2017); OTHER SURGICAL HISTORY; Total shoulder replacement (Right); Joint replacement (2013); Colonoscopy with propofol (N/A, 05/31/2017); Lower Extremity Angiography (Left, 04/04/2018); Esophagogastroduodenoscopy (egd) with propofol (N/A, 10/25/2018); and Incision and drainage of wound (Left, 11/09/2018). Family: family history includes Alcohol abuse in her father; Arthritis in her maternal grandfather, maternal grandmother, mother, paternal grandfather, and paternal grandmother; Breast cancer in her maternal aunt; Cancer in her father and maternal aunt; Cerebral aneurysm in her father; Diabetes in her father, maternal grandmother, and paternal grandmother; Heart disease in her maternal grandfather; Hypertension in her father, maternal grandfather, maternal grandmother, paternal grandfather, and paternal grandmother; Kidney disease in her  mother; Mental illness in her sister; Peripheral vascular disease in her father; Skin cancer in her father; Varicose Veins in her mother.  Laboratory Chemistry Profile   Renal Lab Results  Component Value Date   BUN 25 11/09/2019   CREATININE 1.61 (H) 11/09/2019   BCR 16 11/09/2019   GFR 41.60 (L) 09/14/2019   GFRAA 37 (L) 11/09/2019   GFRNONAA 32 (L) 11/09/2019     Hepatic Lab Results  Component Value Date   AST 15 09/14/2019   ALT 11 09/14/2019   ALBUMIN 3.3 (L) 09/14/2019   ALKPHOS 77 09/14/2019   LIPASE 27 07/28/2018     Electrolytes Lab Results  Component Value Date   NA 139  11/09/2019   K 4.5 11/09/2019   CL 98 11/09/2019   CALCIUM 9.1 11/09/2019   MG 1.8 04/17/2015   PHOS 3.4 08/12/2012     Bone Lab Results  Component Value Date   VD25OH 32.15 05/10/2017     Inflammation (CRP: Acute Phase) (ESR: Chronic Phase) Lab Results  Component Value Date   CRP 8.8 (H) 09/04/2019   ESRSEDRATE 60 (H) 09/04/2019   LATICACIDVEN 1.2 09/02/2019       Note: Above Lab results reviewed.  Recent Imaging Review  DG Foot 2 Views Right CLINICAL DATA:  Foot pain, unable to bear weight on left foot  EXAM: LEFT FOOT - 2 VIEW; RIGHT FOOT - 2 VIEW  COMPARISON:  05/29/2010  FINDINGS: No fracture or dislocation of the bilateral feet. There is a bunion deformity of the left great toe with crossover hammertoe deformity of the left second toe. Severe left first metatarsophalangeal arthrosis. Moderate bilateral midfoot arthrosis. Prior right fifth metatarsal osteotomy. Soft tissues are unremarkable.  IMPRESSION: 1. No fracture or dislocation of the bilateral feet. 2. Severe left first metatarsophalangeal arthrosis. 3. Moderate bilateral midfoot arthrosis.  Electronically Signed   By: Eddie Candle M.D.   On: 09/03/2019 15:26 DG Foot 2 Views Left CLINICAL DATA:  Foot pain, unable to bear weight on left foot  EXAM: LEFT FOOT - 2 VIEW; RIGHT FOOT - 2 VIEW  COMPARISON:  05/29/2010  FINDINGS: No fracture or dislocation of the bilateral feet. There is a bunion deformity of the left great toe with crossover hammertoe deformity of the left second toe. Severe left first metatarsophalangeal arthrosis. Moderate bilateral midfoot arthrosis. Prior right fifth metatarsal osteotomy. Soft tissues are unremarkable.  IMPRESSION: 1. No fracture or dislocation of the bilateral feet. 2. Severe left first metatarsophalangeal arthrosis. 3. Moderate bilateral midfoot arthrosis.  Electronically Signed   By: Eddie Candle M.D.   On: 09/03/2019 15:26 ECHOCARDIOGRAM COMPLETE     ECHOCARDIOGRAM REPORT       Patient Name:   JAEDEN WESTBAY Date of Exam: 09/03/2019 Medical Rec #:  [id number]       Height:       65.0 in Accession #:    [id number]      Weight:       253.3 lb Date of Birth:  10/03/1950       BSA:          2.187 m Patient Age:    69 years        BP:           136/66 mmHg Patient Gender: F               HR:           89 bpm. Exam Location:  [facility]  Procedure: 2D  Echo  Indications:     Chest Pain R07.9   History:         Patient has no prior history of Echocardiogram examinations.   Sonographer:     Arville Go RDCS Referring Phys:  [id number] CZYSAYTK AGBATA Diagnosing Phys: Kate Sable MD  IMPRESSIONS   1. Left ventricular ejection fraction, by estimation, is 60 to 65%. The left ventricle has normal function. The left ventricle has no regional wall motion abnormalities. Left ventricular diastolic parameters were normal.  2. Right ventricular systolic function is normal. The right ventricular size is normal.  3. The mitral valve is normal in structure. No evidence of mitral valve regurgitation. No evidence of mitral stenosis.  4. The aortic valve is normal in structure. Aortic valve regurgitation is not visualized. No aortic stenosis is present.  5. The inferior vena cava is normal in size with <50% respiratory variability, suggesting right atrial pressure of 8 mmHg.  FINDINGS  Left Ventricle: Left ventricular ejection fraction, by estimation, is 60 to 65%. The left ventricle has normal function. The left ventricle has no regional wall motion abnormalities. The left ventricular internal cavity size was normal in size. There is  no left ventricular hypertrophy. Left ventricular diastolic parameters were normal.  Right Ventricle: The right ventricular size is normal. No increase in right ventricular wall thickness. Right ventricular systolic function is normal.  Left Atrium: Left atrial size was normal in size.  Right Atrium: Right atrial size  was normal in size.  Pericardium: There is no evidence of pericardial effusion.  Mitral Valve: The mitral valve is normal in structure. Normal mobility of the mitral valve leaflets. No evidence of mitral valve regurgitation. No evidence of mitral valve stenosis. MV peak gradient, 6.9 mmHg. The mean mitral valve gradient is 3.0 mmHg.  Tricuspid Valve: The tricuspid valve is normal in structure. Tricuspid valve regurgitation is mild . No evidence of tricuspid stenosis.  Aortic Valve: The aortic valve is normal in structure. Aortic valve regurgitation is not visualized. No aortic stenosis is present.  Pulmonic Valve: The pulmonic valve was not well visualized. Pulmonic valve regurgitation is not visualized. No evidence of pulmonic stenosis.  Aorta: The aortic root is normal in size and structure.  Venous: The inferior vena cava is normal in size with less than 50% respiratory variability, suggesting right atrial pressure of 8 mmHg.  IAS/Shunts: No atrial level shunt detected by color flow Doppler.    LEFT VENTRICLE PLAX 2D LVIDd:         4.79 cm  Diastology LVIDs:         3.15 cm  LV e' lateral:   10.30 cm/s LV PW:         1.16 cm  LV E/e' lateral: 10.3 LV IVS:        1.29 cm  LV e' medial:    8.49 cm/s LVOT diam:     1.90 cm  LV E/e' medial:  12.5 LV SV:         59 LV SV Index:   27 LVOT Area:     2.84 cm    RIGHT VENTRICLE RV Basal diam:  3.78 cm RV S prime:     13.90 cm/s  LEFT ATRIUM             Index LA diam:        4.80 cm 2.19 cm/m LA Vol (A2C):   36.6 ml 16.73 ml/m LA Vol (A4C):   39.8 ml 18.20 ml/m LA Biplane  Vol: 39.9 ml 18.24 ml/m  AORTIC VALVE             PULMONIC VALVE LVOT Vmax:   88.10 cm/s  PV Vmax:       1.28 m/s LVOT Vmean:  62.600 cm/s PV Peak grad:  6.6 mmHg LVOT VTI:    0.207 m   AORTA Ao Root diam: 3.20 cm Ao Asc diam:  3.00 cm  MITRAL VALVE                TRICUSPID VALVE MV Area (PHT): 4.31 cm     TV Peak grad:   26.5 mmHg MV Peak grad:   6.9 mmHg     TV Vmax:        2.58 m/s MV Mean grad:  3.0 mmHg MV Vmax:       1.31 m/s     SHUNTS MV Vmean:      79.8 cm/s    Systemic VTI:  0.21 m MV Decel Time: 176 msec     Systemic Diam: 1.90 cm MV E velocity: 106.00 cm/s MV A velocity: 96.80 cm/s MV E/A ratio:  1.10  Kate Sable MD Electronically signed by Kate Sable MD Signature Date/Time: 09/03/2019/12:15:17 PM      Final   Note: Reviewed        Physical Exam  General appearance: Well nourished, well developed, and well hydrated. In no apparent acute distress Mental status: Alert, oriented x 3 (person, place, & time)       Respiratory: No evidence of acute respiratory distress Eyes: PERLA Vitals: BP (!) 150/85 (BP Location: Right Arm, Patient Position: Sitting, Cuff Size: Normal)   Pulse 63   Temp (!) 97.1 F (36.2 C) (Temporal)   Resp 16   Ht '5\' 5"'  (1.651 m)   Wt 252 lb (114.3 kg)   SpO2 (!) 88%   BMI 41.93 kg/m  BMI: Estimated body mass index is 41.93 kg/m as calculated from the following:   Height as of this encounter: '5\' 5"'  (1.651 m).   Weight as of this encounter: 252 lb (114.3 kg). Ideal: Ideal body weight: 57 kg (125 lb 10.6 oz) Adjusted ideal body weight: 79.9 kg (176 lb 3.2 oz)   Lumbar Spine Area Exam  Skin & Axial Inspection: No masses, redness, or swelling Alignment: Symmetrical Functional ROM: Decreased ROM       Stability: No instability detected Muscle Tone/Strength: Functionally intact. No obvious neuro-muscular anomalies detected. Sensory (Neurological): Musculoskeletal pain pattern  Gait & Posture Assessment  Ambulation: Patient came in today in a wheel chair Gait: Limited. Using assistive device to ambulate Posture: Difficulty standing up straight, due to pain   Lower Extremity Exam    Side: Right lower extremity  Side: Left lower extremity  Stability: No instability observed          Stability: No instability observed          Skin & Extremity Inspection: Right calf edema,  tightness, tender to touch  Skin & Extremity Inspection: Skin color, temperature, and hair growth are WNL. No peripheral edema or cyanosis. No masses, redness, swelling, asymmetry, or associated skin lesions. No contractures.  Functional ROM: Pain restricted ROM for hip and knee joints          Functional ROM: Unrestricted ROM                  Muscle Tone/Strength: Functionally intact. No obvious neuro-muscular anomalies detected.  Muscle Tone/Strength: Functionally intact. No obvious neuro-muscular anomalies detected.  Sensory (Neurological): Allodynia (Painful response to non-painful stimuli)        Sensory (Neurological): Unimpaired        DTR: Patellar: deferred today Achilles: deferred today Plantar: deferred today  DTR: Patellar: deferred today Achilles: deferred today Plantar: deferred today  Palpation: No palpable anomalies  Palpation: No palpable anomalies    Assessment   Status Diagnosis  Controlled Controlled Controlled 1. Diabetic peripheral neuropathy associated with type 2 diabetes mellitus ([place])   2. Chronic pain syndrome   3. Pharmacologic therapy   4. Fibromyalgia   5. Chronic musculoskeletal pain   6. Meralgia paresthetica of right side   7. Osteoarthritis involving multiple joints   8. Neuropathy of right lateral femoral cutaneous nerve   9. Chronic anticoagulation (Pradaxa)   10. Peripheral vascular disease due to secondary diabetes mellitus ([place])   11. Acute pain of right lower extremity      Updated Problems: Problem  Meralgia Paresthetica of Right Side  Chronic Pain Syndrome  Neuropathy of Right Lateral Femoral Cutaneous Nerve  Morbid Obesity With Bmi of 40.0-44.9, Adult ([facility])  Chronic Prescription Benzodiazepine Use  Osteoarthritis involving multiple joints  Diabetic peripheral neuropathy associated with type 2 diabetes mellitus ([facility])  Paroxysmal atrial fibrillation ([place]); CHA2DS2-Vasc Score 5. On Pradaxa   This patients CHA2DS2-VASc Score and  unadjusted Ischemic Stroke Rate (% per year) is equal to 7.2 % stroke rate/year from a score of 5  Above score calculated as 1 point each if present [CHF, HTN, DM, Vascular=MI/PAD/Aortic Plaque, Age if 65-74, or Female] Above score calculated as 2 points each if present [Age > 75, or Stroke/TIA/TE]      Plan of Care  [name] has a current medication list which includes the following long-term medication(s): albuterol, albuterol, amlodipine, bupropion, dabigatran, advair hfa, levocetirizine, levothyroxine, losartan-hydrochlorothiazide, metformin, metoprolol succinate, montelukast, pregabalin, duloxetine hcl, [START ON 11/25/2019] hydrocodone-acetaminophen, [START ON 12/25/2019] hydrocodone-acetaminophen, and [START ON 01/24/2020] hydrocodone-acetaminophen.  1.  Chronic pain syndrome: Repeat UDS today.  Refill hydrocodone as below.  No change in dose.  Refill Cymbalta and Lyrica as below.  No side effects with pain regimen.  2.  Right lower extremity acute pain that is worsened over the week with physical exam findings concerning for allodynia, edema, severe pain to palpation.  Recommend stat right lower extremity Doppler to rule out DVT.  Have discussed this with patient's primary care provider's office, Janett Billow to follow-up on.  3.  Follow with Dr. Dossie Arbour in 3 months for medication management.   Pharmacotherapy (Medications Ordered): Meds ordered this encounter  Medications  . HYDROcodone-acetaminophen (NORCO/VICODIN) 5-325 MG tablet    Sig: Take 1 tablet by mouth every 8 (eight) hours as needed for severe pain. Must last 30 days    Dispense:  90 tablet    Refill:  0    Chronic Pain: STOP Act (Not applicable) Fill 1 day early if closed on refill date.  Marland Kitchen HYDROcodone-acetaminophen (NORCO/VICODIN) 5-325 MG tablet    Sig: Take 1 tablet by mouth every 8 (eight) hours as needed for severe pain. Must last 30 days    Dispense:  90 tablet    Refill:  0    Chronic Pain: STOP Act (Not  applicable) Fill 1 day early if closed on refill date.  Marland Kitchen HYDROcodone-acetaminophen (NORCO/VICODIN) 5-325 MG tablet    Sig: Take 1 tablet by mouth every 8 (eight) hours as needed for severe pain. Must last 30 days    Dispense:  90 tablet  Refill:  0    Chronic Pain: STOP Act (Not applicable) Fill 1 day early if closed on refill date.  . DULoxetine HCl 40 MG CPEP    Sig: Take 40 mg by mouth daily.    Dispense:  30 capsule    Refill:  5    Fill one day early if pharmacy is closed on scheduled refill date. May substitute for generic if available.  . pregabalin (LYRICA) 50 MG capsule    Sig: Take 1 capsule (50 mg total) by mouth 3 (three) times daily.    Dispense:  90 capsule    Refill:  5    Fill one day early if pharmacy is closed on scheduled refill date. May substitute for generic if available.   Orders:  Orders Placed This Encounter  Procedures  . US Venous Img Lower Unilateral Right (DVT)    Patient to wait if positive    Standing Status:   Future    Standing Expiration Date:   11/21/2020    Order Specific Question:   Reason for Exam (SYMPTOM  OR DIAGNOSIS REQUIRED)    Answer:   right calf pain    Order Specific Question:   Preferred imaging location?    Answer:   [facility]    Order Specific Question:   Call Results- Best Contact Number?    Answer:   [phone] / [phone] after 4:00  . ToxASSU[employer] Select 13 (MW), Urine    Volume: 30 ml(s). Minimum 3 ml of urine is needed. Document temperature of fresh sample. Indications: Long term (current) use of opiate analgesic [phone])    Order Specific Question:   Release to patient    Answer:   Immediate   Follow-up plan:   Return in about 3 months (around 02/21/2020) for Medication Management, in person (Dr Delane Ginger).   Recent Visits Date Type Provider Dept  08/30/19 Telemedicine Milinda Pointer, MD [facility]  Showing recent visits within past 90 days and meeting all other requirements Today's Visits Date Type  Provider Dept  11/22/19 Office Visit Gillis Santa, MD [facility]  Showing today's visits and meeting all other requirements Future Appointments Date Type Provider Dept  02/19/20 Appointment Milinda Pointer, MD [facility]  Showing future appointments within next 90 days and meeting all other requirements  I discussed the assessment and treatment plan with the patient. The patient was provided an opportunity to ask questions and all were answered. The patient agreed with the plan and demonstrated an understanding of the instructions.  Patient advised to call back or seek an in-person evaluation if the symptoms or condition worsens.  Duration of encounter:54mnutes.  Note by: BGillis Santa MD Date: 11/22/2019; Time: 2:28 PM

## 2019-11-22 NOTE — Progress Notes (Signed)
Nursing Pain Medication Assessment:  Safety precautions to be maintained throughout the outpatient stay will include: orient to surroundings, keep bed in low position, maintain call bell within reach at all times, provide assistance with transfer out of bed and ambulation.  Medication Inspection Compliance: Pill count conducted under aseptic conditions, in front of the patient. Neither the pills nor the bottle was removed from the patient's sight at any time. Once count was completed pills were immediately returned to the patient in their original bottle.  Medication: Hydrocodone/APAP Pill/Patch Count: 14 of 90 pills remain Pill/Patch Appearance: Markings consistent with prescribed medication Bottle Appearance: Standard pharmacy container. Clearly labeled. Filled Date: 07 /23 / 2021 Last Medication intake:  Today

## 2019-11-22 NOTE — Telephone Encounter (Signed)
OK so I will forward as soon as we see the results.

## 2019-11-22 NOTE — Telephone Encounter (Addendum)
The [place] has been completed results in looks like negative for DVT.

## 2019-11-24 ENCOUNTER — Other Ambulatory Visit: Admission: RE | Admit: 2019-11-24 | Payer: PPO | Source: Ambulatory Visit

## 2019-11-27 NOTE — Progress Notes (Signed)
Office Visit    Patient Name: [name] Date of Encounter: 11/27/2019  Primary Care Provider:  [name], [name], MD Primary Cardiologist:  Kathlyn Sacramento, MD Electrophysiologist:  None   Chief Complaint    [name] is a 69 y.o. adult with a hx of PAF, DM2, HTN, HLD, obesity, previous DVT, OSA on CPAP presents today for follow up after medication changes.  Past Medical History    Past Medical History:  Diagnosis Date  . Abnormal antibody titer   . Anxiety and depression   . Arthritis   . Asthma   . Cystocele   . Depression   . Diabetes ([place])   . DVT (deep venous thrombosis) (HCC)    Righ calf  . Dyspnea    11/08/2018 - "more now due to lack of exercise"  . Dysrhythmia    afib  . Edema   . GERD (gastroesophageal reflux disease)   . Heart murmur    Echocardiogram June 2015: Mild MR (possible vegetation seen on TTE, not seen on TEE), normal LV size with moderate concentric LVH. Normal function EF 60-65%. Normal diastolic function. Mild LA dilation.  Marland Kitchen History of IBS   . History of kidney stones   . History of methicillin resistant staphylococcus aureus (MRSA) 2008  . Hyperlipidemia   . Hypertension   . Hypothyroidism   . Insomnia   . Kidney stone    kidney stones with lithotripsy .  [facility]- "adrenal glands"  . Neuropathy involving both lower extremities   . Obesity, Class II, BMI 35-39.9, with comorbidity   . Paroxysmal atrial fibrillation ([place]) 2007   In June 2015, Cardiac Event Monitor: Mostly SR/sinus arrhythmia with PVCs that are frequent. Short bursts of A. fib lasting several minutes;; CHA2DS2-VASc Score = 5 (age, Female, PVD, DM, HTN)  . Peripheral vascular disease ([place])   . Sleep apnea    use C-PAP  . Urinary incontinence   . Venous stasis dermatitis of both lower extremities    Past Surgical History:  Procedure Laterality Date  . ANKLE SURGERY Right   . APPLICATION OF WOUND VAC Left 06/27/2015   Procedure: APPLICATION OF WOUND  VAC ( POSSIBLE ) ;  Surgeon: [name], MD;  Location: [facility]S;  Service: Vascular;  Laterality: Left;  . APPLICATION OF WOUND VAC Left 09/25/2016   Procedure: APPLICATION OF WOUND VAC;  Surgeon: [name], DPM;  Location: [facility];  Service: Podiatry;  Laterality: Left;  . arm surgery     right    . CHOLECYSTECTOMY    . COLONOSCOPY    . COLONOSCOPY WITH PROPOFOL N/A 01/11/2017   Procedure: COLONOSCOPY WITH PROPOFOL;  Surgeon: [name], MD;  Location[facility];  Service: Endoscopy;  Laterality: N/A;  . COLONOSCOPY WITH PROPOFOL N/A 02/22/2017   Procedure: COLONOSCOPY WITH PROPOFOL;  Surgeon: [name], MD;  Location: [facility] END[facility]COPY;  Service: Endoscopy;  Laterality: N/A;  . COLONOSCOPY WITH PROPOFOL N/A 05/31/2017   Procedure: COLONOSCOPY WITH PROPOFOL;  Surgeon: [name], MD;  Location: [facility] ENDOSCOPY;  Service: Endoscopy;  Laterality: N/A;  . ESOPHAGOGASTRODUODENOSCOPY (EGD) WITH PROPOFOL N/A 01/11/2017   Procedure: ESOPHAGOGASTRODUODENOSCOPY (EGD) WITH PROPOFOL;  Surgeon: [name], MD;  Location: [facility] ENDOSCOPY;  Service: Endoscopy;  Laterality: N/A;  . ESOPHAGOGASTRODUODENOSCOPY (EGD) WITH PROPOFOL N/A 10/25/2018   Procedure: ESOPHAGOGASTRODUODENOSCOPY (EGD) WITH PROPOFOL;  Surgeon: [name], MD;  Location: [facility] ENDOSCOPY;  Service: Endoscopy;  Laterality: N/A;  . HERNIA REPAIR  umbilical  . HIATAL HERNIA REPAIR    . I & D EXTREMITY Left 06/27/2015   Procedure: IRRIGATION AND DEBRIDEMENT EXTREMITY            ( CALF HEMATOMA ) POSSIBLE WOUND VAC;  Surgeon: [name], MD;  Location: [facility];  Service: Vascular;  Laterality: Left;  . INCISION AND DRAINAGE OF WOUND Left 09/25/2016   Procedure: IRRIGATION AND DEBRIDEMENT - PARTIAL RESECTION OF ACHILLES TENDON WITH WOUND VAC APPLICATION;  Surgeon: [name], DPM;  Location: [facility];  Service: Podiatry;  Laterality: Left;  . INCISION AND DRAINAGE OF WOUND Left 11/09/2018    Procedure: Excision of left foot wound with ACell placement;  Surgeon: Wallace Going, DO;  Location: [place];  Service: Plastics;  Laterality: Left;  . IRRIGATION AND DEBRIDEMENT ABSCESS Left 09/07/2016   Procedure: IRRIGATION AND DEBRIDEMENT ABSCESS LEFT HEEL;  Surgeon: [name], DPM;  Location: [facility];  Service: Podiatry;  Laterality: Left;  . JOINT REPLACEMENT  2013   left knee replacement  . KIDNEY SURGERY Right    kidney stones  . LITHOTRIPSY    . LOWER EXTREMITY ANGIOGRAPHY Left 04/04/2018   Procedure: LOWER EXTREMITY ANGIOGRAPHY;  Surgeon: [name], MD;  Location: [facility];  Service: Cardiovascular;  Laterality: Left;  . NM GATED MYOVIEW ([place] [facility])  February 2017   Likely breast attenuation. LOW RISK study. Normal EF 55-60%.  . OTHER SURGICAL HISTORY     08/2016 or 09/2016 surgery on achilles tenso h/o staph infection heal. Dr. [name]  . removal of left hematoma Left    leg  . REVERSE SHOULDER ARTHROPLASTY Right 10/08/2015   Procedure: REVERSE SHOULDER ARTHROPLASTY;  Surgeon: [name], MD;  Location: [facility];  Service: Orthopedics;  Laterality: Right;  . SLEEVE GASTROPLASTY    . TONSILLECTOMY    . TOTAL KNEE ARTHROPLASTY Left   . TOTAL SHOULDER REPLACEMENT Right   . TRANSESOPHAGEAL ECHOCARDIOGRAM  08/08/2013   Mild LVH, EF 60-65%. Moderate LA dilation and mild RA dilation. Mild MR with no evidence of stenosis and no evidence of endocarditis. A false chordae is noted.  . TRANSTHORACIC ECHOCARDIOGRAM  08/03/2013   Mild-Moderate concentric LVH, EF 60-65%. Normal diastolic function. Mild LA dilation. Mild MR with possible vegetation  - not confirmed on TEE   . ULNAR NERVE TRANSPOSITION Right 08/06/2016   Procedure: ULNAR NERVE DECOMPRESSION/TRANSPOSITION;  Surgeon: [name], MD;  Location: [facility];  Service: Orthopedics;  Laterality: Right;  . UPPER GI ENDOSCOPY    . VAGINAL HYSTERECTOMY      Allergies  Allergies  Allergen Reactions  .  Morphine And Related Other (See Comments) and Nausea Only    Patient becomes very confused Other reaction(s): Other (See Comments), Other (See Comments) Patient becomes very confused Patient becomes very confused   . Penicillins Hives, Shortness Of Breath, Swelling and Other (See Comments)    Facial swelling Has patient had a PCN reaction causing immediate rash, facial/tongue/throat swelling, SOB or lightheadedness with hypotension: Yes Has patient had a PCN reaction causing severe rash involving mucus membranes or skin necrosis: Yes Has patient had a PCN reaction that required hospitalization No Has patient had a PCN reaction occurring within the last 10 years: No If all of the above answers are "NO", then may proceed with Cephalosporin use.  Other reaction(s): Other (See Comments), Other (See Comments) Facial swelling Has patient had a PCN reaction causing immediate rash, facial/tongue/throat swelling, SOB or lightheadedness with hypotension: Yes  Has patient had a PCN reaction causing severe rash involving mucus membranes or skin necrosis: Yes Has patient had a PCN reaction that required hospitalization No Has patient had a PCN reaction occurring within the last 10 years: No If all of the above answers are "NO", then may proceed with Cephalosporin use. Facial swelling Has patient had a PCN reaction causing immediate rash, facial/tongue/throat swelling, SOB or lightheadedness with hypotension: Yes Has patient had a PCN reaction causing severe rash involving mucus membranes or skin necrosis: Yes Has patient had a PCN reaction that required hospitalization No Has patient had a PCN reaction occurring within the last 10 years: No If all of the above answers are "NO", then may proceed with Cephalosporin use. Facial swelling Has patient had a PCN reaction causing immediate rash, facial/tongue/throat swelling, SOB or lightheadedness with hypotension: Yes Has patient had a PCN reaction causing  severe rash involving mucus membranes or skin necrosis: Yes Has patient had a PCN reaction that required hospitalization No Has patient had a PCN reaction occurring within the last 10 years: No If all of the above answers are "NO", then may proceed with Cephalosporin use.   . Ambien [Zolpidem]     Hallucination   . Aspirin Hives  . Oxytetracycline Hives    Other reaction(s): Unknown     History of Present Illness    [name] is a 69 y.o. adult with a hx of PAF, DM2, HTN, HLD, obesity, previous DVT, OSA on CPAP.  She was last seen 11/09/19.   She follows with Dr. Lucky Cowboy for chronic left foot ulceration.  There was no evidence of PAD on noninvasive Doppler as well as angiogram done in 2020.  She had debridement with subsequent improvement.  She had atypical chest pain in the past and had Lexiscan Myoview 03/2018 was low risk with no evidence of ischemia.  Her PAF has been well controlled on Toprol 50 daily, she had bradycardia on higher doses.  She is anticoagulated with Pradaxa.  She was admitted June 2021 with pneumonia and sepsis.  She had minimally elevated troponins of 120, 170, 143 which were asymptomatic and was presumed to be due to demand ischemia. Echocardiogram while admitted with EF 60 to 65%, no RWMA, RV normal size and function, no significant valvular abnormalities.  She called the office 10/30/2019 noting similar symptoms to when she had sepsis and was very concerned.  Subsequent urine culture with primary care showed klebsiella which was resistant to many antbiotics. She was started on Levaquin which was the only oral option left.  Per documentation from primary care provider she will need to present to ED for IV antibiotics if she does not respond to Levaquin.  Seen in clinic 11/09/19. Noted symptoms including fatigue, tiredness, tremor which she associates with her recent UTI.  DOE was stable at baseline. Some chest tightness when her daughter and grandadughter argue in  front of her, but no chest pain with exertional activity. Blood work showed mild decline in kidney function. Her primary care provider stopped her HCTZ 12.41m, continued her Losartan/HCTZ, started Norvasc 2.554m and stopped Mobic.   Reports feeling better since completing course of Levaquin. Reports no shortness of breath. Endorses stable dyspnea on exertion. Reports no chest pain, pressure, or tightness. No edema, orthopnea, PND. Reports no palpitations. She got to bake a cake for her granddaughters birthday recently which she very much enjoyed.  e EKGs/Labs/Other Studies Reviewed:   The following studies were reviewed today:  Echo 08/2019 1.  Left ventricular ejection fraction, by estimation, is 60 to 65%. The  left ventricle has normal function. The left ventricle has no regional  wall motion abnormalities. Left ventricular diastolic parameters were  normal.   2. Right ventricular systolic function is normal. The right ventricular  size is normal.   3. The mitral valve is normal in structure. No evidence of mitral valve  regurgitation. No evidence of mitral stenosis.   4. The aortic valve is normal in structure. Aortic valve regurgitation is  not visualized. No aortic stenosis is present.   5. The inferior vena cava is normal in size with <50% respiratory  variability, suggesting right atrial pressure of 8 mmHg.   EKG:  EKG is ordered today.  The ekg ordered today demonstrates NSR 68 bpm with no acute ST/T wave changes.   Recent Labs: 09/02/2019: B Natriuretic Peptide 384.0 09/14/2019: ALT 11 11/09/2019: BUN 25; Creatinine, Ser 1.61; Hemoglobin 10.9; Platelets 276; Potassium 4.5; Sodium 139  Recent Lipid Panel    Component Value Date/Time   CHOL 181 05/12/2018 1028   CHOL 185 10/10/2014 0822   TRIG 95.0 05/12/2018 1028   HDL 82.60 05/12/2018 1028   HDL 94 10/10/2014 0822   CHOLHDL 2 05/12/2018 1028   VLDL 19.0 05/12/2018 1028   LDLCALC 80 05/12/2018 1028   LDLCALC 73 10/10/2014  0822    Home Medications   Current Outpatient Medications on File Prior to Visit  Medication Sig Dispense Refill  . acetaminophen (TYLENOL) 325 MG tablet Take 2 tablets (650 mg total) by mouth every 6 (six) hours as needed for mild pain or moderate pain (or Fever >/= 101).    Marland Kitchen albuterol (PROVENTIL HFA;VENTOLIN HFA) 108 (90 Base) MCG/ACT inhaler Inhale 1-2 puffs into the lungs every 4 (four) hours as needed for wheezing or shortness of breath. (Patient taking differently: Inhale 2 puffs into the lungs every 4 (four) hours as needed for wheezing or shortness of breath. ) 8 g 2  . albuterol (PROVENTIL) (2.5 MG/3ML) 0.083% nebulizer solution Take 3 mLs (2.5 mg total) by nebulization every 6 (six) hours as needed for wheezing or shortness of breath. 150 mL 1  . ALPRAZolam (XANAX) 0.5 MG tablet Take 0.5 tablets (0.25 mg total) by mouth 2 (two) times daily as needed for anxiety. 60 tablet 2  . amLODipine (NORVASC) 2.5 MG tablet Take 1 tablet (2.5 mg total) by mouth daily. D/c hctz 12.5 alone 90 tablet 3  . blood glucose meter kit and supplies KIT Dispense based on patient and insurance preference. Use up to 2 times daily as directed. (FOR ICD-10 E11.9). 1 each 0  . buPROPion (WELLBUTRIN XL) 150 MG 24 hr tablet Take 1 tablet (150 mg total) by mouth daily. 90 tablet 3  . dabigatran (PRADAXA) 150 MG CAPS capsule Take 1 capsule (150 mg total) by mouth 2 (two) times daily. Please call to schedule office visit for further refills. Thank you! 60 capsule 0  . DULoxetine HCl 40 MG CPEP Take 40 mg by mouth daily. 30 capsule 5  . estradiol (ESTRACE) 0.1 MG/GM vaginal cream Place 1 Applicatorful vaginally every Monday, Wednesday, and Friday. Apply 0.31m (pea-sized amount)  just inside the vaginal introitus with a finger-tip on Monday, Wednesday and Friday nights, 42.5 g 11  . fluticasone-salmeterol (ADVAIR HFA) 115-21 MCG/ACT inhaler Inhale 2 puffs into the lungs daily. Rinse mouth thoroughly after use 1 Inhaler 12    . HYDROcodone-acetaminophen (NORCO/VICODIN) 5-325 MG tablet Take 1 tablet by mouth every 8 (eight)  hours as needed for severe pain. Must last 30 days 90 tablet 0  . [START ON 12/25/2019] HYDROcodone-acetaminophen (NORCO/VICODIN) 5-325 MG tablet Take 1 tablet by mouth every 8 (eight) hours as needed for severe pain. Must last 30 days 90 tablet 0  . [START ON 01/24/2020] HYDROcodone-acetaminophen (NORCO/VICODIN) 5-325 MG tablet Take 1 tablet by mouth every 8 (eight) hours as needed for severe pain. Must last 30 days 90 tablet 0  . Lancets (ONETOUCH DELICA PLUS [id number]) MISC TEST UP TO TWICE DAILY    . levocetirizine (XYZAL) 5 MG tablet Take 0.5 tablets (2.5 mg total) by mouth every evening. 45 tablet 3  . levofloxacin (LEVAQUIN) 500 MG tablet Take 500 mg by mouth daily.    Marland Kitchen levothyroxine (SYNTHROID) 175 MCG tablet Take 1 tablet (175 mcg total) by mouth daily before breakfast. 90 tablet 3  . losartan-hydrochlorothiazide (HYZAAR) 50-12.5 MG tablet Take 1 tablet by mouth daily. In am. D/c hctz 12.5 alone and d/c mobic use tylenol for pain as needed 90 tablet 3  . metFORMIN (GLUCOPHAGE) 1000 MG tablet Take 1 tablet (1,000 mg total) by mouth 2 (two) times daily. With food 180 tablet 3  . metoprolol succinate (TOPROL-XL) 50 MG 24 hr tablet TAKE 1 TABLET BY MOUTH DAILY 90 tablet 1  . montelukast (SINGULAIR) 10 MG tablet Take 1 tablet (10 mg total) by mouth at bedtime. 90 tablet 3  . mupirocin ointment (BACTROBAN) 2 % Apply 1 application topically 2 (two) times daily. Skin wounds 30 g 0  . nystatin cream (MYCOSTATIN) Apply 1 application topically 2 (two) times daily. Mouth prn for 7 days as needed 30 g 11  . ondansetron (ZOFRAN) 4 MG tablet Take 1 tablet (4 mg total) by mouth every 8 (eight) hours as needed for nausea or vomiting. 90 tablet 1  . ONETOUCH ULTRA test strip TEST UP TO TWICE DAILY 100 each 1  . pantoprazole (PROTONIX) 40 MG tablet Take by mouth.    . pregabalin (LYRICA) 50 MG capsule Take 1  capsule (50 mg total) by mouth 3 (three) times daily. 90 capsule 5  . TOVIAZ 8 MG TB24 tablet TAKE ONE TABLET BY MOUTH EVERY DAY 90 tablet 3  . Vitamin D, Ergocalciferol, (DRISDOL) 1.25 MG (50000 UNIT) CAPS capsule Take 1 capsule (50,000 Units total) by mouth once a week. 5 capsule   . zinc oxide 20 % ointment Apply 1 application topically as needed for irritation. 56.7 g 0   Current Facility-Administered Medications on File Prior to Visit  Medication Dose Route Frequency Provider Last Rate Last Admin  . ipratropium-albuterol (DUONEB) 0.5-2.5 (3) MG/3ML nebulizer solution 3 mL  3 mL Nebulization Q6H [name], [name], MD       Review of Systems   Review of Systems  Constitutional: Negative for chills, fever and malaise/fatigue.  Cardiovascular: Positive for dyspnea on exertion. Negative for chest pain, irregular heartbeat, leg swelling, near-syncope, orthopnea, palpitations and syncope.  Respiratory: Negative for cough, shortness of breath and wheezing.   Gastrointestinal: Negative for melena, nausea and vomiting.  Genitourinary: Negative for hematuria.  Neurological: Negative for dizziness, light-headedness and weakness.   All other systems reviewed and are otherwise negative except as noted above.  Physical Exam    VS:  There were no vitals taken for this visit. , BMI There is no height or weight on file to calculate BMI. GEN: Well nourished, overweight, well developed, in no acute distress. HEENT: normal. Neck: Supple, no JVD, carotid bruits, or masses. Cardiac: RRR,  no murmurs, rubs, or gallops. No clubbing, cyanosis, edema.  Radials/PT 2+ and equal bilaterally.  Respiratory:  Respirations regular and unlabored, clear to auscultation bilaterally. GI: Soft, nontender, non-distended. MS: No deformity or atrophy. Skin: Warm and dry, no rash.  Left heel wrap in place. Neuro:  Strength and sensation are intact. Psych: Normal affect.  Assessment & Plan     1. UTI/Fatigue/DOE/Lightheadedness - Resolved since completion of Levaquin. Following with urology. Reports her dyspnea is improved and back to her baseline. Reports improved energy and interests in participating in activities. No further workup at this time.  2. PAF/chronic anticoagulation -maintaining normal sinus rhythm by EKG today.  No bleeding complications. Continue Pradaxa 154m daily.   3. HTN - BP elevated. Her HCTZ 12.5343mwas stopped due to decline in renal function and Amlodipine 2.43m76mtarted by PCP. Increase Amlodipine to 43mg19mily.    4. OSA -reports compliance to CPAP.  Encouraged continued compliance.  Disposition: Follow up in 4 month(s) with Dr. AridFletcher AnonAPP   CaitLoel Dubonnet 11/27/2019, 8:28 PM

## 2019-11-28 ENCOUNTER — Encounter: Payer: Self-pay | Admitting: Family

## 2019-11-28 ENCOUNTER — Other Ambulatory Visit: Payer: Self-pay

## 2019-11-28 ENCOUNTER — Other Ambulatory Visit
Admission: RE | Admit: 2019-11-28 | Discharge: 2019-11-28 | Disposition: A | Discharge status: Home / Self Care | Payer: PPO | Source: Ambulatory Visit | Attending: Internal Medicine | Admitting: Internal Medicine

## 2019-11-28 ENCOUNTER — Ambulatory Visit: Payer: PPO | Admitting: Family

## 2019-11-28 VITALS — BP 160/86 | HR 68 | Ht 65.0 in | Wt 254.5 lb

## 2019-11-28 DIAGNOSIS — G894 Chronic pain syndrome: Secondary | ICD-10-CM | POA: Diagnosis not present

## 2019-11-28 DIAGNOSIS — I480 Paroxysmal atrial fibrillation: Secondary | ICD-10-CM

## 2019-11-28 DIAGNOSIS — Z01812 Encounter for preprocedural laboratory examination: Secondary | ICD-10-CM | POA: Diagnosis not present

## 2019-11-28 DIAGNOSIS — I10 Essential (primary) hypertension: Secondary | ICD-10-CM | POA: Diagnosis not present

## 2019-11-28 DIAGNOSIS — Z20822 Contact with and (suspected) exposure to covid-19: Secondary | ICD-10-CM | POA: Insufficient documentation

## 2019-11-28 DIAGNOSIS — Z7901 Long term (current) use of anticoagulants: Secondary | ICD-10-CM

## 2019-11-28 DIAGNOSIS — G4733 Obstructive sleep apnea (adult) (pediatric): Secondary | ICD-10-CM | POA: Diagnosis not present

## 2019-11-28 MED ORDER — AMLODIPINE BESYLATE 5 MG PO TABS
5.0000 mg | ORAL_TABLET | Freq: Every day | ORAL | 1 refills | Status: DC
Start: 2019-11-28 — End: 2020-05-03

## 2019-11-28 NOTE — Patient Instructions (Addendum)
Medication Instructions:  Your physician has recommended you make the following change in your medication:   CHANGE Amlodipine to 5mg  daily  *If you need a refill on your cardiac medications before your next appointment, please call your pharmacy*   Lab Work: No lab work today.  Testing/Procedures: Your EKG today shows your are maintaining normal sinus rhythm.   Follow-Up: At [facility], you and your health needs are our priority.  As part of our continuing mission to provide you with exceptional heart care, we have created designated Provider Care Teams.  These Care Teams include your primary Cardiologist (physician) and Advanced Practice Providers (APPs -  Physician Assistants and Nurse Practitioners) who all work together to provide you with the care you need, when you need it.  We recommend signing up for the patient portal called "MyChart".  Sign up information is provided on this After Visit Summary.  MyChart is used to connect with patients for Virtual Visits (Telemedicine).  Patients are able to view lab/test results, encounter notes, upcoming appointments, etc.  Non-urgent messages can be sent to your provider as well.   To learn more about what you can do with MyChart, go to [email or web address].    Your next appointment:   6 month(s)  The format for your next appointment:   In Person  Provider:    You may see Kathlyn Sacramento, MD or one of the following Advanced Practice Providers on your designated Care Team:  Murray Hodgkins, NP  Laurann Montana, NP  Christell Faith, PA-C  Marrianne Mood, PA-C   Other Instructions  Tips to Measure your Blood Pressure Correctly  To determine whether you have hypertension, a medical professional will take a blood pressure reading. How you prepare for the test, the position of your arm, and other factors can change a blood pressure reading by 10% or more. That could be enough to hide high blood pressure, start you on a drug you  don't really need, or lead your doctor to incorrectly adjust your medications.  National and international guidelines offer specific instructions for measuring blood pressure. If a doctor, nurse, or medical assistant isn't doing it right, don't hesitate to ask him or her to get with the guidelines.  Here's what you can do to ensure a correct reading: . Don't drink a caffeinated beverage or smoke during the 30 minutes before the test. . Sit quietly for five minutes before the test begins. . During the measurement, sit in a chair with your feet on the floor and your arm supported so your elbow is at about heart level. . The inflatable part of the cuff should completely cover at least 80% of your upper arm, and the cuff should be placed on bare skin, not over a shirt. . Don't talk during the measurement. . Have your blood pressure measured twice, with a brief break in between. If the readings are different by 5 points or more, have it done a third time.  Blood pressure categories  Blood pressure category SYSTOLIC (upper number)  DIASTOLIC (lower number)  Normal Less than 120 mm Hg and Less than 80 mm Hg  Elevated 120-129 mm Hg and Less than 80 mm Hg  High blood pressure: Stage 1 hypertension 130-139 mm Hg or 80-89 mm Hg  High blood pressure: Stage 2 hypertension 140 mm Hg or higher or 90 mm Hg or higher  Hypertensive crisis (consult your doctor immediately) Higher than 180 mm Hg and/or Higher than 120 mm Hg  Source: American  Heart Association and American Stroke Association. For more on getting your blood pressure under control, buy Controlling Your Blood Pressure, a Special Health Report from [facility].   Blood Pressure Log   Date   Time  Blood Pressure  Position  Example: Nov 1 9 AM 124/78 sitting

## 2019-11-29 ENCOUNTER — Ambulatory Visit: Payer: PPO | Admitting: Anesthesiology

## 2019-11-29 ENCOUNTER — Encounter
Admission: RE | Disposition: A | Discharge status: Home / Self Care | Payer: Self-pay | Source: Home / Self Care | Attending: Internal Medicine

## 2019-11-29 ENCOUNTER — Ambulatory Visit: Payer: PPO | Admitting: Pain Medicine

## 2019-11-29 ENCOUNTER — Encounter: Payer: Self-pay | Admitting: Internal Medicine

## 2019-11-29 ENCOUNTER — Ambulatory Visit
Admission: RE | Admit: 2019-11-29 | Discharge: 2019-11-29 | Disposition: A | Discharge status: Home / Self Care | Payer: PPO | Attending: Internal Medicine | Admitting: Internal Medicine

## 2019-11-29 DIAGNOSIS — Z8614 Personal history of Methicillin resistant Staphylococcus aureus infection: Secondary | ICD-10-CM | POA: Insufficient documentation

## 2019-11-29 DIAGNOSIS — Z79890 Hormone replacement therapy (postmenopausal): Secondary | ICD-10-CM | POA: Insufficient documentation

## 2019-11-29 DIAGNOSIS — K222 Esophageal obstruction: Secondary | ICD-10-CM | POA: Insufficient documentation

## 2019-11-29 DIAGNOSIS — F418 Other specified anxiety disorders: Secondary | ICD-10-CM | POA: Diagnosis not present

## 2019-11-29 DIAGNOSIS — Z6835 Body mass index (BMI) 35.0-35.9, adult: Secondary | ICD-10-CM | POA: Insufficient documentation

## 2019-11-29 DIAGNOSIS — K449 Diaphragmatic hernia without obstruction or gangrene: Secondary | ICD-10-CM | POA: Insufficient documentation

## 2019-11-29 DIAGNOSIS — Z86718 Personal history of other venous thrombosis and embolism: Secondary | ICD-10-CM | POA: Insufficient documentation

## 2019-11-29 DIAGNOSIS — F419 Anxiety disorder, unspecified: Secondary | ICD-10-CM | POA: Insufficient documentation

## 2019-11-29 DIAGNOSIS — I480 Paroxysmal atrial fibrillation: Secondary | ICD-10-CM | POA: Insufficient documentation

## 2019-11-29 DIAGNOSIS — K317 Polyp of stomach and duodenum: Secondary | ICD-10-CM | POA: Insufficient documentation

## 2019-11-29 DIAGNOSIS — Z7984 Long term (current) use of oral hypoglycemic drugs: Secondary | ICD-10-CM | POA: Insufficient documentation

## 2019-11-29 DIAGNOSIS — D131 Benign neoplasm of stomach: Secondary | ICD-10-CM | POA: Diagnosis not present

## 2019-11-29 DIAGNOSIS — G4730 Sleep apnea, unspecified: Secondary | ICD-10-CM | POA: Insufficient documentation

## 2019-11-29 DIAGNOSIS — E785 Hyperlipidemia, unspecified: Secondary | ICD-10-CM | POA: Diagnosis not present

## 2019-11-29 DIAGNOSIS — E669 Obesity, unspecified: Secondary | ICD-10-CM | POA: Diagnosis not present

## 2019-11-29 DIAGNOSIS — I10 Essential (primary) hypertension: Secondary | ICD-10-CM | POA: Diagnosis not present

## 2019-11-29 DIAGNOSIS — Z79899 Other long term (current) drug therapy: Secondary | ICD-10-CM | POA: Insufficient documentation

## 2019-11-29 DIAGNOSIS — F329 Major depressive disorder, single episode, unspecified: Secondary | ICD-10-CM | POA: Diagnosis not present

## 2019-11-29 DIAGNOSIS — Z9884 Bariatric surgery status: Secondary | ICD-10-CM | POA: Insufficient documentation

## 2019-11-29 DIAGNOSIS — Z7951 Long term (current) use of inhaled steroids: Secondary | ICD-10-CM | POA: Insufficient documentation

## 2019-11-29 DIAGNOSIS — K2950 Unspecified chronic gastritis without bleeding: Secondary | ICD-10-CM | POA: Insufficient documentation

## 2019-11-29 DIAGNOSIS — R1310 Dysphagia, unspecified: Secondary | ICD-10-CM | POA: Diagnosis not present

## 2019-11-29 DIAGNOSIS — K219 Gastro-esophageal reflux disease without esophagitis: Secondary | ICD-10-CM | POA: Diagnosis not present

## 2019-11-29 DIAGNOSIS — Z7901 Long term (current) use of anticoagulants: Secondary | ICD-10-CM | POA: Insufficient documentation

## 2019-11-29 DIAGNOSIS — E1151 Type 2 diabetes mellitus with diabetic peripheral angiopathy without gangrene: Secondary | ICD-10-CM | POA: Diagnosis not present

## 2019-11-29 DIAGNOSIS — R1314 Dysphagia, pharyngoesophageal phase: Secondary | ICD-10-CM | POA: Diagnosis not present

## 2019-11-29 DIAGNOSIS — E039 Hypothyroidism, unspecified: Secondary | ICD-10-CM | POA: Diagnosis not present

## 2019-11-29 DIAGNOSIS — M1990 Unspecified osteoarthritis, unspecified site: Secondary | ICD-10-CM | POA: Diagnosis not present

## 2019-11-29 DIAGNOSIS — J45909 Unspecified asthma, uncomplicated: Secondary | ICD-10-CM | POA: Insufficient documentation

## 2019-11-29 DIAGNOSIS — K209 Esophagitis, unspecified without bleeding: Secondary | ICD-10-CM | POA: Diagnosis not present

## 2019-11-29 DIAGNOSIS — E1140 Type 2 diabetes mellitus with diabetic neuropathy, unspecified: Secondary | ICD-10-CM | POA: Diagnosis not present

## 2019-11-29 HISTORY — PX: ESOPHAGOGASTRODUODENOSCOPY (EGD) WITH PROPOFOL: SHX5813

## 2019-11-29 LAB — KOH PREP: Special Requests: NORMAL

## 2019-11-29 LAB — GLUCOSE, CAPILLARY: Glucose-Capillary: 146 mg/dL — ABNORMAL HIGH (ref 70–99)

## 2019-11-29 LAB — SARS CORONAVIRUS 2 (TAT 6-24 HRS): SARS Coronavirus 2: NEGATIVE

## 2019-11-29 SURGERY — ESOPHAGOGASTRODUODENOSCOPY (EGD) WITH PROPOFOL
Anesthesia: General

## 2019-11-29 MED ORDER — SODIUM CHLORIDE 0.9 % IV SOLN: INTRAVENOUS | Status: DC

## 2019-11-29 MED ORDER — PROPOFOL 500 MG/50ML IV EMUL
INTRAVENOUS | Status: AC
  Filled 2019-11-29: qty 50

## 2019-11-29 MED ORDER — PROPOFOL 500 MG/50ML IV EMUL
INTRAVENOUS | Status: DC | PRN
  Administered 2019-11-29: 170 ug/kg/min via INTRAVENOUS

## 2019-11-29 MED ORDER — PROPOFOL 10 MG/ML IV BOLUS
INTRAVENOUS | Status: DC | PRN
  Administered 2019-11-29: 70 mg via INTRAVENOUS

## 2019-11-29 NOTE — Interval H&P Note (Signed)
History and Physical Interval Note:  11/29/2019 10:52 AM  [name]  has presented today for surgery, with the diagnosis of GASTRIC ADENOMA,DYSPHAGIA.  The various methods of treatment have been discussed with the patient and family. After consideration of risks, benefits and other options for treatment, the patient has consented to  Procedure(s): ESOPHAGOGASTRODUODENOSCOPY (EGD) WITH PROPOFOL (N/A) as a surgical intervention.  The patient's history has been reviewed, patient examined, no change in status, stable for surgery.  I have reviewed the patient's chart and labs.  Questions were answered to the patient's satisfaction.     [place], [place]

## 2019-11-29 NOTE — Anesthesia Postprocedure Evaluation (Signed)
Anesthesia Post Note  Patient: [name]  Procedure(s) Performed: ESOPHAGOGASTRODUODENOSCOPY (EGD) WITH PROPOFOL (N/A )  Patient location during evaluation: Endoscopy Anesthesia Type: General Level of consciousness: awake and alert Pain management: pain level controlled Vital Signs Assessment: post-procedure vital signs reviewed and stable Respiratory status: spontaneous breathing and respiratory function stable Cardiovascular status: stable Anesthetic complications: no   No complications documented.   Last Vitals:  Vitals:   11/29/19 1003 11/29/19 1118  BP: (!) 175/89 (!) 142/63  Pulse: 77 82  Resp: 20 18  Temp: 36.6 C   SpO2: 98%     Last Pain:  Vitals:   11/29/19 1118  TempSrc:   PainSc: 0-No pain                 Johnel Yielding K

## 2019-11-29 NOTE — Anesthesia Preprocedure Evaluation (Signed)
Anesthesia Evaluation  Patient identified by MRN, date of birth, ID band Patient awake    Reviewed: Allergy & Precautions, NPO status , Patient's Chart, lab work & pertinent test results  History of Anesthesia Complications Negative for: history of anesthetic complications  Airway Mallampati: III       Dental   Pulmonary asthma , sleep apnea and Continuous Positive Airway Pressure Ventilation , COPD,  COPD inhaler,           Cardiovascular hypertension, Pt. on medications + Peripheral Vascular Disease  (-) Past MI and (-) CHF + dysrhythmias + Valvular Problems/Murmurs (murmur)      Neuro/Psych neg Seizures Anxiety Depression    GI/Hepatic Neg liver ROS, GERD  Medicated and Controlled,  Endo/Other  diabetes, Type 2, Oral Hypoglycemic AgentsHypothyroidism   Renal/GU Renal disease (stones, UTI)     Musculoskeletal   Abdominal   Peds  Hematology   Anesthesia Other Findings   Reproductive/Obstetrics                             Anesthesia Physical Anesthesia Plan  ASA: III  Anesthesia Plan: General   Post-op Pain Management:    Induction: Intravenous  PONV Risk Score and Plan: 3 and Propofol infusion, TIVA and Treatment may vary due to age or medical condition  Airway Management Planned: Nasal Cannula  Additional Equipment:   Intra-op Plan:   Post-operative Plan:   Informed Consent: I have reviewed the patients History and Physical, chart, labs and discussed the procedure including the risks, benefits and alternatives for the proposed anesthesia with the patient or authorized representative who has indicated his/her understanding and acceptance.       Plan Discussed with:   Anesthesia Plan Comments:         Anesthesia Quick Evaluation

## 2019-11-29 NOTE — Transfer of Care (Signed)
Immediate Anesthesia Transfer of Care Note  Patient: [name]  Procedure(s) Performed: ESOPHAGOGASTRODUODENOSCOPY (EGD) WITH PROPOFOL (N/A )  Patient Location: PACU  Anesthesia Type:General  Level of Consciousness: awake and alert   Airway & Oxygen Therapy: Patient Spontanous Breathing and Patient connected to nasal cannula oxygen  Post-op Assessment: Report given to RN and Post -op Vital signs reviewed and stable  Post vital signs: Reviewed and stable  Last Vitals:  Vitals Value Taken Time  BP 142/63 11/29/19 1118  Temp    Pulse 38 11/29/19 1119  Resp 15 11/29/19 1119  SpO2 78 % 11/29/19 1119  Vitals shown include unvalidated device data.  Last Pain:  Vitals:   11/29/19 1003  TempSrc: Temporal  PainSc: 0-No pain         Complications: No complications documented.

## 2019-11-29 NOTE — H&P (Signed)
Outpatient short stay form Pre-procedure 11/29/2019 10:50 AM Dean Wonder K. Alice Reichert, M.D.  Primary Physician: Orland Mustard, M.D.  Reason for visit:  Dysphagia, hx of esophageal dysmotility, presbyesophagus, GERD  History of present illness: Patient with continued dysphagia to solids despite PPI therapy. No abdominal pain or weight loss.    Current Facility-Administered Medications:  .  0.9 %  sodium chloride infusion, , Intravenous, Continuous, [place], Benay Pike, MD, Last Rate: 20 mL/hr at 11/29/19 1035, New Bag at 11/29/19 1035  Facility-Administered Medications Prior to Admission  Medication Dose Route Frequency Provider Last Rate Last Admin  . ipratropium-albuterol (DUONEB) 0.5-2.5 (3) MG/3ML nebulizer solution 3 mL  3 mL Nebulization Q6H [name], [name], MD       Medications Prior to Admission  Medication Sig Dispense Refill Last Dose  . amLODipine (NORVASC) 5 MG tablet Take 1 tablet (5 mg total) by mouth daily. 90 tablet 1 11/29/2019 at Unknown time  . buPROPion (WELLBUTRIN XL) 150 MG 24 hr tablet Take 1 tablet (150 mg total) by mouth daily. 90 tablet 3 11/28/2019 at Unknown time  . DULoxetine HCl 40 MG CPEP Take 40 mg by mouth daily. 30 capsule 5 11/28/2019 at Unknown time  . estradiol (ESTRACE) 0.1 MG/GM vaginal cream Place 1 Applicatorful vaginally every Monday, Wednesday, and Friday. Apply 0.35m (pea-sized amount)  just inside the vaginal introitus with a finger-tip on Monday, Wednesday and Friday nights, 42.5 g 11 Past Week at Unknown time  . fluticasone-salmeterol (ADVAIR HFA) 115-21 MCG/ACT inhaler Inhale 2 puffs into the lungs daily. Rinse mouth thoroughly after use 1 Inhaler 12 11/28/2019 at Unknown time  . HYDROcodone-acetaminophen (NORCO/VICODIN) 5-325 MG tablet Take 1 tablet by mouth every 8 (eight) hours as needed for severe pain. Must last 30 days 90 tablet 0 11/28/2019 at Unknown time  . levocetirizine (XYZAL) 5 MG tablet Take 0.5 tablets (2.5 mg total) by mouth every  evening. 45 tablet 3 11/28/2019 at Unknown time  . levothyroxine (SYNTHROID) 175 MCG tablet Take 1 tablet (175 mcg total) by mouth daily before breakfast. 90 tablet 3 11/29/2019 at Unknown time  . metFORMIN (GLUCOPHAGE) 1000 MG tablet Take 1 tablet (1,000 mg total) by mouth 2 (two) times daily. With food 180 tablet 3 11/28/2019 at Unknown time  . metoprolol succinate (TOPROL-XL) 50 MG 24 hr tablet TAKE 1 TABLET BY MOUTH DAILY 90 tablet 1 11/29/2019 at Unknown time  . montelukast (SINGULAIR) 10 MG tablet Take 1 tablet (10 mg total) by mouth at bedtime. 90 tablet 3 11/28/2019 at Unknown time  . pantoprazole (PROTONIX) 40 MG tablet Take by mouth.   11/29/2019 at Unknown time  . pregabalin (LYRICA) 50 MG capsule Take 1 capsule (50 mg total) by mouth 3 (three) times daily. 90 capsule 5 11/28/2019 at Unknown time  . TOVIAZ 8 MG TB24 tablet TAKE ONE TABLET BY MOUTH EVERY DAY 90 tablet 3 11/29/2019 at Unknown time  . Vitamin D, Ergocalciferol, (DRISDOL) 1.25 MG (50000 UNIT) CAPS capsule Take 1 capsule (50,000 Units total) by mouth once a week. 5 capsule  11/28/2019 at Unknown time  . acetaminophen (TYLENOL) 325 MG tablet Take 2 tablets (650 mg total) by mouth every 6 (six) hours as needed for mild pain or moderate pain (or Fever >/= 101).     .Marland Kitchenalbuterol (PROVENTIL HFA;VENTOLIN HFA) 108 (90 Base) MCG/ACT inhaler Inhale 1-2 puffs into the lungs every 4 (four) hours as needed for wheezing or shortness of breath. (Patient taking differently: Inhale 2 puffs into the lungs every  4 (four) hours as needed for wheezing or shortness of breath. ) 8 g 2   . albuterol (PROVENTIL) (2.5 MG/3ML) 0.083% nebulizer solution Take 3 mLs (2.5 mg total) by nebulization every 6 (six) hours as needed for wheezing or shortness of breath. 150 mL 1   . ALPRAZolam (XANAX) 0.5 MG tablet Take 0.5 tablets (0.25 mg total) by mouth 2 (two) times daily as needed for anxiety. 60 tablet 2   . blood glucose meter kit and supplies KIT Dispense based on patient and  insurance preference. Use up to 2 times daily as directed. (FOR ICD-10 E11.9). 1 each 0   . dabigatran (PRADAXA) 150 MG CAPS capsule Take 1 capsule (150 mg total) by mouth 2 (two) times daily. Please call to schedule office visit for further refills. Thank you! 60 capsule 0 11/26/2019  . [START ON 12/25/2019] HYDROcodone-acetaminophen (NORCO/VICODIN) 5-325 MG tablet Take 1 tablet by mouth every 8 (eight) hours as needed for severe pain. Must last 30 days 90 tablet 0   . [START ON 01/24/2020] HYDROcodone-acetaminophen (NORCO/VICODIN) 5-325 MG tablet Take 1 tablet by mouth every 8 (eight) hours as needed for severe pain. Must last 30 days 90 tablet 0   . Lancets (ONETOUCH DELICA PLUS [id number]) MISC TEST UP TO TWICE DAILY     . levofloxacin (LEVAQUIN) 500 MG tablet Take 500 mg by mouth daily. (Patient not taking: Reported on 11/29/2019)   Completed Course at Unknown time  . losartan-hydrochlorothiazide (HYZAAR) 50-12.5 MG tablet Take 1 tablet by mouth daily. In am. D/c hctz 12.5 alone and d/c mobic use tylenol for pain as needed (Patient not taking: Reported on 11/29/2019) 90 tablet 3 Not Taking at Unknown time  . mupirocin ointment (BACTROBAN) 2 % Apply 1 application topically 2 (two) times daily. Skin wounds 30 g 0   . nystatin cream (MYCOSTATIN) Apply 1 application topically 2 (two) times daily. Mouth prn for 7 days as needed (Patient not taking: Reported on 11/29/2019) 30 g 11 Completed Course at Unknown time  . ondansetron (ZOFRAN) 4 MG tablet Take 1 tablet (4 mg total) by mouth every 8 (eight) hours as needed for nausea or vomiting. 90 tablet 1   . ONETOUCH ULTRA test strip TEST UP TO TWICE DAILY 100 each 1   . zinc oxide 20 % ointment Apply 1 application topically as needed for irritation. 56.7 g 0      Allergies  Allergen Reactions  . Morphine And Related Other (See Comments) and Nausea Only    Patient becomes very confused Other reaction(s): Other (See Comments), Other (See Comments) Patient becomes  very confused Patient becomes very confused   . Penicillins Hives, Shortness Of Breath, Swelling and Other (See Comments)    Facial swelling Has patient had a PCN reaction causing immediate rash, facial/tongue/throat swelling, SOB or lightheadedness with hypotension: Yes Has patient had a PCN reaction causing severe rash involving mucus membranes or skin necrosis: Yes Has patient had a PCN reaction that required hospitalization No Has patient had a PCN reaction occurring within the last 10 years: No If all of the above answers are "NO", then may proceed with Cephalosporin use.  Other reaction(s): Other (See Comments), Other (See Comments) Facial swelling Has patient had a PCN reaction causing immediate rash, facial/tongue/throat swelling, SOB or lightheadedness with hypotension: Yes Has patient had a PCN reaction causing severe rash involving mucus membranes or skin necrosis: Yes Has patient had a PCN reaction that required hospitalization No Has patient had a PCN reaction  occurring within the last 10 years: No If all of the above answers are "NO", then may proceed with Cephalosporin use. Facial swelling Has patient had a PCN reaction causing immediate rash, facial/tongue/throat swelling, SOB or lightheadedness with hypotension: Yes Has patient had a PCN reaction causing severe rash involving mucus membranes or skin necrosis: Yes Has patient had a PCN reaction that required hospitalization No Has patient had a PCN reaction occurring within the last 10 years: No If all of the above answers are "NO", then may proceed with Cephalosporin use. Facial swelling Has patient had a PCN reaction causing immediate rash, facial/tongue/throat swelling, SOB or lightheadedness with hypotension: Yes Has patient had a PCN reaction causing severe rash involving mucus membranes or skin necrosis: Yes Has patient had a PCN reaction that required hospitalization No Has patient had a PCN reaction occurring  within the last 10 years: No If all of the above answers are "NO", then may proceed with Cephalosporin use.   . Ambien [Zolpidem]     Hallucination   . Aspirin Hives  . Oxytetracycline Hives    Other reaction(s): Unknown      Past Medical History:  Diagnosis Date  . Abnormal antibody titer   . Anxiety and depression   . Arthritis   . Asthma   . Cystocele   . Depression   . Diabetes ([place])   . DVT (deep venous thrombosis) ([facility])    Righ calf  . Dyspnea    11/08/2018 - "more now due to lack of exercise"  . Dysrhythmia    afib  . Edema   . GERD (gastroesophageal reflux disease)   . Heart murmur    Echocardiogram June 2015: Mild MR (possible vegetation seen on TTE, not seen on TEE), normal LV size with moderate concentric LVH. Normal function EF 60-65%. Normal diastolic function. Mild LA dilation.  Marland Kitchen History of IBS   . History of kidney stones   . History of methicillin resistant staphylococcus aureus (MRSA) 2008  . Hyperlipidemia   . Hypertension   . Hypothyroidism   . Insomnia   . Kidney stone    kidney stones with lithotripsy .  [facility]- "adrenal glands"  . Neuropathy involving both lower extremities   . Obesity, Class II, BMI 35-39.9, with comorbidity   . Paroxysmal atrial fibrillation ([place]) 2007   In June 2015, Cardiac Event Monitor: Mostly SR/sinus arrhythmia with PVCs that are frequent. Short bursts of A. fib lasting several minutes;; CHA2DS2-VASc Score = 5 (age, Female, PVD, DM, HTN)  . Peripheral vascular disease ([place])   . Sleep apnea    use C-PAP  . Urinary incontinence   . Venous stasis dermatitis of both lower extremities     Review of systems:  Otherwise negative.    Physical Exam  Gen: Alert, oriented. Appears stated age.  HEENT: [place]/AT. PERRLA. Lungs: CTA, no wheezes. CV: RR nl S1, S2. Abd: soft, benign, no masses. BS+ Ext: No edema. Pulses 2+    Planned procedures: Proceed with EGD. The patient understands the nature of the planned  procedure, indications, risks, alternatives and potential complications including but not limited to bleeding, infection, perforation, damage to internal organs and possible oversedation/side effects from anesthesia. The patient agrees and gives consent to proceed.  Please refer to procedure notes for findings, recommendations and patient disposition/instructions.     Jonmarc Bodkin K. Alice Reichert, M.D. Gastroenterology 11/29/2019  10:50 AM

## 2019-11-29 NOTE — Op Note (Signed)
[facility]astroenterology Patient Name: [name] Procedure Date: 11/29/2019 10:59 AM MRN: [id number] Account #: [id number] Date of Birth: 30-Jan-1951 Admit Type: Outpatient Age: 69 Room: [facility] ENDO ROOM 3 Gender: Female Note Status: Finalized Procedure:             Upper GI endoscopy Indications:           Esophageal dysphagia, Esophageal reflux Providers:             Benay Pike. Alice Reichert MD, MD Referring MD:          [name] [name] MD, MD (Referring MD) Medicines:             Propofol per Anesthesia Complications:         No immediate complications. Procedure:             Pre-Anesthesia Assessment:                        - The risks and benefits of the procedure and the                         sedation options and risks were discussed with the                         patient. All questions were answered and informed                         consent was obtained.                        - Patient identification and proposed procedure were                         verified prior to the procedure by the nurse. The                         procedure was verified in the procedure room.                        - ASA Grade Assessment: III - A patient with severe                         systemic disease.                        - After reviewing the risks and benefits, the patient                         was deemed in satisfactory condition to undergo the                         procedure.                        After obtaining informed consent, the endoscope was                         passed under direct vision. Throughout the procedure,                         the patient's  blood pressure, pulse, and oxygen                         saturations were monitored continuously. The Endoscope                         was introduced through the mouth, and advanced to the                         third part of duodenum. The upper GI endoscopy was                          accomplished without difficulty. The patient tolerated                         the procedure well. Findings:      Patchy, yellow plaques were found in the lower third of the esophagus.       Cells for cytology were obtained by brushing.      One benign-appearing, intrinsic mild stenosis was found at the       gastroesophageal junction. This stenosis measured 1.5 cm (inner       diameter) x less than one cm (in length). The stenosis was traversed.       The scope was withdrawn. Dilation was performed with a Maloney dilator       with mild resistance at 84 Fr.      Multiple small sessile polyps with no bleeding and no stigmata of recent       bleeding were found on the lesser curvature of the gastric body.       Biopsies were taken with a cold forceps for histology.      A 1 cm hiatal hernia was present.      Patchy mild inflammation characterized by erythema was found in the       gastric antrum.      The examined duodenum was normal.      Evidence of a sleeve gastrectomy was found in the lesser curvature of       the stomach. This was characterized by healthy appearing mucosa and an       intact appearance.      The exam was otherwise without abnormality. Impression:            - Esophageal plaques were found, doubt candidiasis.                         Cells for cytology obtained.                        - Benign-appearing esophageal stenosis. Dilated.                        - Multiple gastric polyps. Biopsied.                        - 1 cm hiatal hernia.                        - Chronic gastritis.                        - Normal examined duodenum.                        -  A sleeve gastrectomy was found, characterized by an                         intact appearance and healthy appearing mucosa.                        - The examination was otherwise normal. Recommendation:        - Patient has a contact number available for                         emergencies. The signs and symptoms of  potential                         delayed complications were discussed with the patient.                         Return to normal activities tomorrow. Written                         discharge instructions were provided to the patient.                        - Resume previous diet.                        - Continue present medications.                        - Await pathology results.                        - Monitor results to esophageal dilation                        - If brushing negative and little or no improvement                         with Maloney dilation, consider esophageal manometry.                        - Return to nurse practitioner in 2 months.                        - Follow up with Stephens November, GI Nurse                         Practioner, in office to discuss results and monitor                         progress.                        - The findings and recommendations were discussed with                         the patient. Procedure Code(s):     --- Professional ---                        [phone], Esophagogastroduodenoscopy, flexible,  transoral; with biopsy, single or multiple                        43450, Dilation of esophagus, by unguided sound or                         bougie, single or multiple passes Diagnosis Code(s):     --- Professional ---                        W25.85, Bariatric surgery status                        K29.50, Unspecified chronic gastritis without bleeding                        K44.9, Diaphragmatic hernia without obstruction or                         gangrene                        K31.7, Polyp of stomach and duodenum                        K22.2, Esophageal obstruction                        K22.9, Disease of esophagus, unspecified CPT copyright 2019 [employer]. All rights reserved. The codes documented in this report are preliminary and upon coder review may  be revised to meet current  compliance requirements. Efrain Sella MD, MD 11/29/2019 11:19:59 AM This report has been signed electronically. Number of Addenda: 0 Note Initiated On: 11/29/2019 10:59 AM Estimated Blood Loss:  Estimated blood loss: none.      [facility]

## 2019-11-30 ENCOUNTER — Other Ambulatory Visit: Payer: Self-pay

## 2019-11-30 ENCOUNTER — Encounter: Payer: Self-pay | Admitting: Internal Medicine

## 2019-11-30 ENCOUNTER — Ambulatory Visit
Admission: RE | Admit: 2019-11-30 | Discharge: 2019-11-30 | Disposition: A | Discharge status: Home / Self Care | Payer: PPO | Source: Ambulatory Visit | Attending: Internal Medicine | Admitting: Internal Medicine

## 2019-11-30 ENCOUNTER — Other Ambulatory Visit: Payer: Self-pay | Admitting: Internal Medicine

## 2019-11-30 DIAGNOSIS — K449 Diaphragmatic hernia without obstruction or gangrene: Secondary | ICD-10-CM | POA: Diagnosis not present

## 2019-11-30 DIAGNOSIS — R1319 Other dysphagia: Secondary | ICD-10-CM

## 2019-11-30 DIAGNOSIS — R1013 Epigastric pain: Secondary | ICD-10-CM

## 2019-11-30 DIAGNOSIS — J986 Disorders of diaphragm: Secondary | ICD-10-CM | POA: Diagnosis not present

## 2019-11-30 DIAGNOSIS — R1310 Dysphagia, unspecified: Secondary | ICD-10-CM | POA: Diagnosis not present

## 2019-11-30 LAB — SURGICAL PATHOLOGY

## 2019-12-02 LAB — TOXASSURE SELECT 13 (MW), URINE

## 2019-12-05 ENCOUNTER — Telehealth: Payer: Self-pay | Admitting: Internal Medicine

## 2019-12-05 ENCOUNTER — Other Ambulatory Visit: Payer: Self-pay | Admitting: Internal Medicine

## 2019-12-05 DIAGNOSIS — N1832 Chronic kidney disease, stage 3b: Secondary | ICD-10-CM

## 2019-12-05 DIAGNOSIS — D649 Anemia, unspecified: Secondary | ICD-10-CM

## 2019-12-05 NOTE — Telephone Encounter (Signed)
Left message with husband to call back.

## 2019-12-05 NOTE — Telephone Encounter (Signed)
Done

## 2019-12-05 NOTE — Telephone Encounter (Signed)
Pt coming in for labs 12/28/19 I believe fasting labs  Please do orders from 02/14/20 fasting and orders from today 9/14 ANA, iron panel, SPEP,UPEP  If one of you can write this in on the lab sch so whoever working in the lab will know pleaes   Thanks [place]

## 2019-12-05 NOTE — Telephone Encounter (Signed)
Given kidney function I rec stopping metfomin 100 mg bid  Is she agreeable to Januvia 50 mg daily ?

## 2019-12-06 NOTE — Telephone Encounter (Signed)
Faxed RxRenewal request to [facility] for metformin HCL 1000 mg tablet take one tablet twice daily with food faxed on 12-06-19

## 2019-12-06 NOTE — Telephone Encounter (Signed)
Patient informed and verbalized understanding.  Agreeable to change medications

## 2019-12-07 ENCOUNTER — Telehealth: Payer: Self-pay | Admitting: Internal Medicine

## 2019-12-07 NOTE — Telephone Encounter (Signed)
Patient's husband called in. States she has been very weak and had trouble getting her up. She recently had an endoscopy and esophagus stretched. After this she was sick and had diarrhea for a week. Patient had a low grade fever and chills.   In this last week Patient having foot pain, upper leg weakness, and swelling in the feet. Patient has been increasingly fatigued.   Patient was taken off metformin and placed on Januvia.   Please advise  Wanting know if symptoms are related to her procedure or medication changes. States her other doctors have been changing medication as well.   Patient's husband would like a call back on his cell.

## 2019-12-08 NOTE — Telephone Encounter (Signed)
Called and spoke with the Patient's husband.  He states that they found a lot of yeast in the Patient's throat when doing the endoscopy and have now put her on medication to treat. States the Patient's vitals are normal and she is now holding down solid food.   No nausea, vomiting, diarrhea, chills, or fever since after her procedure. Patient scheduled to come in office 12/20/19 at 1:00 pm.   Informed Patient's husband that should her weakness increase, her symptoms come back, or she has increased lower extremity swelling she needs to go to the ED.  He verbalized understanding.

## 2019-12-08 NOTE — Telephone Encounter (Signed)
If really weak back to hospital  Schedule in person visit if no covid sx's but if covid sxs rec get tested [facility]  Has he checked her vital signs  We do need an in person visit soon so please schedule   [facility] still coming to her home?

## 2019-12-11 DIAGNOSIS — E1142 Type 2 diabetes mellitus with diabetic polyneuropathy: Secondary | ICD-10-CM | POA: Diagnosis not present

## 2019-12-11 DIAGNOSIS — L97521 Non-pressure chronic ulcer of other part of left foot limited to breakdown of skin: Secondary | ICD-10-CM | POA: Diagnosis not present

## 2019-12-11 DIAGNOSIS — R2689 Other abnormalities of gait and mobility: Secondary | ICD-10-CM | POA: Diagnosis not present

## 2019-12-19 ENCOUNTER — Other Ambulatory Visit: Payer: Self-pay

## 2019-12-19 ENCOUNTER — Emergency Department
Admission: EM | Admit: 2019-12-19 | Discharge: 2019-12-20 | Disposition: A | Discharge status: Home / Self Care | Payer: PPO | Source: Home / Self Care | Attending: Emergency Medicine | Admitting: Emergency Medicine

## 2019-12-19 ENCOUNTER — Emergency Department: Payer: PPO

## 2019-12-19 DIAGNOSIS — R29707 NIHSS score 7: Secondary | ICD-10-CM | POA: Diagnosis not present

## 2019-12-19 DIAGNOSIS — N390 Urinary tract infection, site not specified: Secondary | ICD-10-CM | POA: Diagnosis not present

## 2019-12-19 DIAGNOSIS — L97529 Non-pressure chronic ulcer of other part of left foot with unspecified severity: Secondary | ICD-10-CM | POA: Insufficient documentation

## 2019-12-19 DIAGNOSIS — M6281 Muscle weakness (generalized): Secondary | ICD-10-CM | POA: Diagnosis not present

## 2019-12-19 DIAGNOSIS — Z79890 Hormone replacement therapy (postmenopausal): Secondary | ICD-10-CM | POA: Insufficient documentation

## 2019-12-19 DIAGNOSIS — W19XXXA Unspecified fall, initial encounter: Secondary | ICD-10-CM | POA: Diagnosis not present

## 2019-12-19 DIAGNOSIS — R0902 Hypoxemia: Secondary | ICD-10-CM | POA: Diagnosis not present

## 2019-12-19 DIAGNOSIS — G894 Chronic pain syndrome: Secondary | ICD-10-CM | POA: Diagnosis not present

## 2019-12-19 DIAGNOSIS — F419 Anxiety disorder, unspecified: Secondary | ICD-10-CM | POA: Diagnosis not present

## 2019-12-19 DIAGNOSIS — E782 Mixed hyperlipidemia: Secondary | ICD-10-CM | POA: Diagnosis not present

## 2019-12-19 DIAGNOSIS — I10 Essential (primary) hypertension: Secondary | ICD-10-CM | POA: Insufficient documentation

## 2019-12-19 DIAGNOSIS — Z86718 Personal history of other venous thrombosis and embolism: Secondary | ICD-10-CM | POA: Diagnosis not present

## 2019-12-19 DIAGNOSIS — J45909 Unspecified asthma, uncomplicated: Secondary | ICD-10-CM | POA: Insufficient documentation

## 2019-12-19 DIAGNOSIS — M79673 Pain in unspecified foot: Secondary | ICD-10-CM | POA: Insufficient documentation

## 2019-12-19 DIAGNOSIS — E559 Vitamin D deficiency, unspecified: Secondary | ICD-10-CM | POA: Diagnosis not present

## 2019-12-19 DIAGNOSIS — M109 Gout, unspecified: Secondary | ICD-10-CM | POA: Diagnosis not present

## 2019-12-19 DIAGNOSIS — G4733 Obstructive sleep apnea (adult) (pediatric): Secondary | ICD-10-CM | POA: Diagnosis not present

## 2019-12-19 DIAGNOSIS — I679 Cerebrovascular disease, unspecified: Secondary | ICD-10-CM | POA: Diagnosis not present

## 2019-12-19 DIAGNOSIS — Z7901 Long term (current) use of anticoagulants: Secondary | ICD-10-CM | POA: Diagnosis not present

## 2019-12-19 DIAGNOSIS — R011 Cardiac murmur, unspecified: Secondary | ICD-10-CM | POA: Diagnosis not present

## 2019-12-19 DIAGNOSIS — I6340 Cerebral infarction due to embolism of unspecified cerebral artery: Secondary | ICD-10-CM | POA: Diagnosis not present

## 2019-12-19 DIAGNOSIS — I771 Stricture of artery: Secondary | ICD-10-CM | POA: Diagnosis not present

## 2019-12-19 DIAGNOSIS — E1142 Type 2 diabetes mellitus with diabetic polyneuropathy: Secondary | ICD-10-CM | POA: Diagnosis not present

## 2019-12-19 DIAGNOSIS — F32A Depression, unspecified: Secondary | ICD-10-CM | POA: Diagnosis not present

## 2019-12-19 DIAGNOSIS — G8194 Hemiplegia, unspecified affecting left nondominant side: Secondary | ICD-10-CM | POA: Diagnosis not present

## 2019-12-19 DIAGNOSIS — I482 Chronic atrial fibrillation, unspecified: Secondary | ICD-10-CM | POA: Diagnosis not present

## 2019-12-19 DIAGNOSIS — E785 Hyperlipidemia, unspecified: Secondary | ICD-10-CM | POA: Diagnosis not present

## 2019-12-19 DIAGNOSIS — R279 Unspecified lack of coordination: Secondary | ICD-10-CM | POA: Diagnosis not present

## 2019-12-19 DIAGNOSIS — F329 Major depressive disorder, single episode, unspecified: Secondary | ICD-10-CM | POA: Diagnosis not present

## 2019-12-19 DIAGNOSIS — J441 Chronic obstructive pulmonary disease with (acute) exacerbation: Secondary | ICD-10-CM | POA: Insufficient documentation

## 2019-12-19 DIAGNOSIS — R410 Disorientation, unspecified: Secondary | ICD-10-CM | POA: Diagnosis not present

## 2019-12-19 DIAGNOSIS — Z90710 Acquired absence of both cervix and uterus: Secondary | ICD-10-CM | POA: Diagnosis not present

## 2019-12-19 DIAGNOSIS — I639 Cerebral infarction, unspecified: Secondary | ICD-10-CM | POA: Diagnosis not present

## 2019-12-19 DIAGNOSIS — K219 Gastro-esophageal reflux disease without esophagitis: Secondary | ICD-10-CM | POA: Diagnosis not present

## 2019-12-19 DIAGNOSIS — R05 Cough: Secondary | ICD-10-CM | POA: Insufficient documentation

## 2019-12-19 DIAGNOSIS — J9811 Atelectasis: Secondary | ICD-10-CM | POA: Diagnosis not present

## 2019-12-19 DIAGNOSIS — E1151 Type 2 diabetes mellitus with diabetic peripheral angiopathy without gangrene: Secondary | ICD-10-CM | POA: Insufficient documentation

## 2019-12-19 DIAGNOSIS — Z7951 Long term (current) use of inhaled steroids: Secondary | ICD-10-CM | POA: Insufficient documentation

## 2019-12-19 DIAGNOSIS — G4700 Insomnia, unspecified: Secondary | ICD-10-CM | POA: Diagnosis not present

## 2019-12-19 DIAGNOSIS — R52 Pain, unspecified: Secondary | ICD-10-CM | POA: Diagnosis not present

## 2019-12-19 DIAGNOSIS — Z20822 Contact with and (suspected) exposure to covid-19: Secondary | ICD-10-CM | POA: Diagnosis not present

## 2019-12-19 DIAGNOSIS — M7732 Calcaneal spur, left foot: Secondary | ICD-10-CM | POA: Diagnosis not present

## 2019-12-19 DIAGNOSIS — Z7984 Long term (current) use of oral hypoglycemic drugs: Secondary | ICD-10-CM | POA: Insufficient documentation

## 2019-12-19 DIAGNOSIS — R531 Weakness: Secondary | ICD-10-CM | POA: Diagnosis not present

## 2019-12-19 DIAGNOSIS — Z79899 Other long term (current) drug therapy: Secondary | ICD-10-CM | POA: Insufficient documentation

## 2019-12-19 DIAGNOSIS — M7989 Other specified soft tissue disorders: Secondary | ICD-10-CM | POA: Diagnosis not present

## 2019-12-19 DIAGNOSIS — Z96611 Presence of right artificial shoulder joint: Secondary | ICD-10-CM | POA: Insufficient documentation

## 2019-12-19 DIAGNOSIS — I480 Paroxysmal atrial fibrillation: Secondary | ICD-10-CM | POA: Diagnosis not present

## 2019-12-19 DIAGNOSIS — M79602 Pain in left arm: Secondary | ICD-10-CM | POA: Diagnosis not present

## 2019-12-19 DIAGNOSIS — N3281 Overactive bladder: Secondary | ICD-10-CM | POA: Diagnosis not present

## 2019-12-19 DIAGNOSIS — I63233 Cerebral infarction due to unspecified occlusion or stenosis of bilateral carotid arteries: Secondary | ICD-10-CM | POA: Diagnosis not present

## 2019-12-19 DIAGNOSIS — M19072 Primary osteoarthritis, left ankle and foot: Secondary | ICD-10-CM | POA: Diagnosis not present

## 2019-12-19 DIAGNOSIS — I69398 Other sequelae of cerebral infarction: Secondary | ICD-10-CM | POA: Diagnosis not present

## 2019-12-19 DIAGNOSIS — E1140 Type 2 diabetes mellitus with diabetic neuropathy, unspecified: Secondary | ICD-10-CM | POA: Insufficient documentation

## 2019-12-19 DIAGNOSIS — Z6841 Body Mass Index (BMI) 40.0 and over, adult: Secondary | ICD-10-CM | POA: Diagnosis not present

## 2019-12-19 DIAGNOSIS — E039 Hypothyroidism, unspecified: Secondary | ICD-10-CM | POA: Insufficient documentation

## 2019-12-19 DIAGNOSIS — Z96652 Presence of left artificial knee joint: Secondary | ICD-10-CM | POA: Insufficient documentation

## 2019-12-19 DIAGNOSIS — E11621 Type 2 diabetes mellitus with foot ulcer: Secondary | ICD-10-CM | POA: Insufficient documentation

## 2019-12-19 DIAGNOSIS — I69320 Aphasia following cerebral infarction: Secondary | ICD-10-CM | POA: Diagnosis not present

## 2019-12-19 DIAGNOSIS — J309 Allergic rhinitis, unspecified: Secondary | ICD-10-CM | POA: Diagnosis not present

## 2019-12-19 DIAGNOSIS — I82409 Acute embolism and thrombosis of unspecified deep veins of unspecified lower extremity: Secondary | ICD-10-CM | POA: Insufficient documentation

## 2019-12-19 DIAGNOSIS — S0993XA Unspecified injury of face, initial encounter: Secondary | ICD-10-CM | POA: Diagnosis not present

## 2019-12-19 DIAGNOSIS — R4702 Dysphasia: Secondary | ICD-10-CM | POA: Diagnosis not present

## 2019-12-19 LAB — CBC
HCT: 32.5 % — ABNORMAL LOW (ref 36.0–46.0)
Hemoglobin: 9.9 g/dL — ABNORMAL LOW (ref 12.0–15.0)
MCH: 25.7 pg — ABNORMAL LOW (ref 26.0–34.0)
MCHC: 30.5 g/dL (ref 30.0–36.0)
MCV: 84.4 fL (ref 80.0–100.0)
Platelets: 286 10*3/uL (ref 150–400)
RBC: 3.85 MIL/uL — ABNORMAL LOW (ref 3.87–5.11)
RDW: 15.7 % — ABNORMAL HIGH (ref 11.5–15.5)
WBC: 8.3 10*3/uL (ref 4.0–10.5)
nRBC: 0 % (ref 0.0–0.2)

## 2019-12-19 LAB — CBC WITH DIFFERENTIAL/PLATELET
Abs Immature Granulocytes: 0.02 10*3/uL (ref 0.00–0.07)
Basophils Absolute: 0.1 10*3/uL (ref 0.0–0.1)
Basophils Relative: 1 %
Eosinophils Absolute: 0.3 10*3/uL (ref 0.0–0.5)
Eosinophils Relative: 4 %
HCT: 31.6 % — ABNORMAL LOW (ref 36.0–46.0)
Hemoglobin: 9.7 g/dL — ABNORMAL LOW (ref 12.0–15.0)
Immature Granulocytes: 0 %
Lymphocytes Relative: 20 %
Lymphs Abs: 1.4 10*3/uL (ref 0.7–4.0)
MCH: 25.9 pg — ABNORMAL LOW (ref 26.0–34.0)
MCHC: 30.7 g/dL (ref 30.0–36.0)
MCV: 84.3 fL (ref 80.0–100.0)
Monocytes Absolute: 0.5 10*3/uL (ref 0.1–1.0)
Monocytes Relative: 7 %
Neutro Abs: 4.7 10*3/uL (ref 1.7–7.7)
Neutrophils Relative %: 68 %
Platelets: 302 10*3/uL (ref 150–400)
RBC: 3.75 MIL/uL — ABNORMAL LOW (ref 3.87–5.11)
RDW: 15.6 % — ABNORMAL HIGH (ref 11.5–15.5)
WBC: 7 10*3/uL (ref 4.0–10.5)
nRBC: 0 % (ref 0.0–0.2)

## 2019-12-19 LAB — BASIC METABOLIC PANEL
Anion gap: 8 (ref 5–15)
BUN: 23 mg/dL (ref 8–23)
CO2: 26 mmol/L (ref 22–32)
Calcium: 8.7 mg/dL — ABNORMAL LOW (ref 8.9–10.3)
Chloride: 102 mmol/L (ref 98–111)
Creatinine, Ser: 1.31 mg/dL — ABNORMAL HIGH (ref 0.44–1.00)
GFR calc Af Amer: 48 mL/min — ABNORMAL LOW (ref >60)
GFR calc non Af Amer: 41 mL/min — ABNORMAL LOW (ref >60)
Glucose, Bld: 104 mg/dL — ABNORMAL HIGH (ref 70–99)
Potassium: 4.7 mmol/L (ref 3.5–5.1)
Sodium: 136 mmol/L (ref 135–145)

## 2019-12-19 LAB — HEPATIC FUNCTION PANEL
ALT: 10 U/L (ref 0–44)
AST: 14 U/L — ABNORMAL LOW (ref 15–41)
Albumin: 3.5 g/dL (ref 3.5–5.0)
Alkaline Phosphatase: 84 U/L (ref 38–126)
Bilirubin, Direct: <0.1 mg/dL (ref 0.0–0.2)
Total Bilirubin: 0.5 mg/dL (ref 0.3–1.2)
Total Protein: 7 g/dL (ref 6.5–8.1)

## 2019-12-19 LAB — LACTIC ACID, PLASMA: Lactic Acid, Venous: 2 mmol/L (ref 0.5–1.9)

## 2019-12-19 LAB — SEDIMENTATION RATE: Sed Rate: 49 mm/hr — ABNORMAL HIGH (ref 0–30)

## 2019-12-19 LAB — TROPONIN I (HIGH SENSITIVITY): Troponin I (High Sensitivity): 8 ng/L (ref <18)

## 2019-12-19 MED ORDER — SODIUM CHLORIDE 0.9 % IV BOLUS
1000.0000 mL | Freq: Once | INTRAVENOUS | Status: AC
  Administered 2019-12-19: 1000 mL via INTRAVENOUS

## 2019-12-19 NOTE — ED Triage Notes (Addendum)
Pt comes via EMS from home with c/o weakness. EMs states pt unable to stand and husband is unable to help. EMs reports last time this happened the pt was septic.  Pt states she has just felt so weak for last few days. Pt has been fatigue per husband. Pt states possible UTI with foul odor. Pt also c/o left arm soreness and weakness.  Pt does not wear O2 but currently on O2 at 3L [place]

## 2019-12-19 NOTE — Telephone Encounter (Signed)
Pt husband called to let Dr. Olivia Mackie know that  [name] went to ED

## 2019-12-19 NOTE — Telephone Encounter (Signed)
Patient's husband calling in. States Patient is doing worse. EMS were getting to the residence while I was on the phone with the Patient's husband. Informed the Patient's husband that is she is doing worse she needs to go with them to the ED.   Patient's husband states that Patient has no lower body strength at all. Patient is severely fatigued and sleeping a lot. Patient has been having drooping in the left eye. Patient has also been having cognitive changes. States he is concerned because the Patient's father passed from cerebral hemorrhaging.   Informed the Patient's husband that it is important she go with EMS to the hospital now. He states she will not want to go and he does not want her to go due to [place]. Informed him that the hospital is taking precautions against the spread of COVID, that her symptoms are severe and she needs to go. Patient was set to see [place] in office 12/20/19, informed the husband that she should not wait until then and with these symptoms we would still have to send her to the ER.   He verbalized understanding and states he will try to get the Patient to go with EMS to the ED. EMS had made it inside the residence at the time of phone disconnection

## 2019-12-19 NOTE — ED Notes (Signed)
Husband is leaving to handle some issues and will return. Please call if a decision is made to admit.

## 2019-12-19 NOTE — ED Provider Notes (Signed)
[facility] Emergency Department Provider Note   ____________________________________________   First MD Initiated Contact with Patient 12/19/19 1922     (approximate)  I have reviewed the triage vital signs and the nursing notes.   HISTORY  Chief Complaint Weakness    HPI [name] is a 69 y.o. adult who has felt weak for the last few days.  Last time she felt like this she was septic.  She thought her urine was bad smelling.  She has not urinated yet though.  She has a slight cough but nothing bad.  Nothing unusual.  She is not short of breath.  She has no other complaints.  She is 96% on room air at 1145         Past Medical History:  Diagnosis Date  . Abnormal antibody titer   . Anxiety and depression   . Arthritis   . Asthma   . Cystocele   . Depression   . Diabetes ([place])   . DVT (deep venous thrombosis) (HCC)    Righ calf  . Dyspnea    11/08/2018 - "more now due to lack of exercise"  . Dysrhythmia    afib  . Edema   . GERD (gastroesophageal reflux disease)   . Heart murmur    Echocardiogram June 2015: Mild MR (possible vegetation seen on TTE, not seen on TEE), normal LV size with moderate concentric LVH. Normal function EF 60-65%. Normal diastolic function. Mild LA dilation.  Marland Kitchen History of IBS   . History of kidney stones   . History of methicillin resistant staphylococcus aureus (MRSA) 2008  . Hyperlipidemia   . Hypertension   . Hypothyroidism   . Insomnia   . Kidney stone    kidney stones with lithotripsy .  [facility]- "adrenal glands"  . Neuropathy involving both lower extremities   . Obesity, Class II, BMI 35-39.9, with comorbidity   . Paroxysmal atrial fibrillation ([place]) 2007   In June 2015, Cardiac Event Monitor: Mostly SR/sinus arrhythmia with PVCs that are frequent. Short bursts of A. fib lasting several minutes;; CHA2DS2-VASc Score = 5 (age, Female, PVD, DM, HTN)  . Peripheral vascular disease ([place])   . Sleep  apnea    use C-PAP  . Urinary incontinence   . Venous stasis dermatitis of both lower extremities     Patient Active Problem List   Diagnosis Date Noted  . Chronic bacteriuria 11/17/2019  . Hyperkalemia 09/14/2019  . Bacteremia due to Gram-positive bacteria 09/08/2019  . Sepsis ([place]) 09/08/2019  . Toxic encephalopathy 09/08/2019  . Pressure injury of skin 09/04/2019  . Elevated troponin 09/02/2019  . COPD with acute exacerbation ([place]) 09/02/2019  . Acute respiratory failure with hypoxia ([place]) 09/02/2019  . Pharmacologic therapy 08/30/2019  . Recurrent falls 07/10/2019  . Left ankle pain 06/02/2019  . Chronic pain of right ankle 02/14/2019  . Arthritis 02/14/2019  . Nausea and vomiting 02/14/2019  . Meralgia paresthetica of right side 12/05/2018  . Chronic anticoagulation (Pradaxa) 12/05/2018  . Peripheral vascular disease ([place]) 10/18/2018  . Chronic ulcer of right leg ([place]) 10/18/2018  . Fibromyalgia 08/17/2018  . Chronic musculoskeletal pain 08/17/2018  . Tremor of right hand 08/03/2018  . Overactive bladder 08/03/2018  . Physical deconditioning 08/03/2018  . Foot ulcer, left ([place]) 03/09/2018  . Adrenal mass ([place]) 08/02/2017  . Eczema 06/27/2017  . Diabetic ulcer of left foot ([place]) 06/24/2017  . Mass of both adrenal glands ([place]) 05/16/2017  . Fatty liver  05/13/2017  . Strain of right hip 04/30/2017  . Ischial pain, right 04/30/2017  . Pain of left hip joint 04/22/2017  . Abnormality of gait and mobility 04/22/2017  . Muscle strain of left hip 04/09/2017  . Anemia 11/18/2016  . Asthma 11/18/2016  . Carpal tunnel syndrome 11/18/2016  . Pain in joint involving ankle and foot 11/18/2016  . Obstructive sleep apnea of adult 11/18/2016  . Spinal stenosis of thoracic region 11/18/2016  . Abscess of tendon of left foot 09/21/2016  . Cellulitis 09/05/2016  . Cubital tunnel syndrome on right 08/07/2016  . Chronic pain syndrome 02/25/2016  . Long term current use of opiate  analgesic 02/25/2016  . Chronic right shoulder pain 02/25/2016  . CAP (community acquired pneumonia) 10/11/2015  . Status post reverse total shoulder replacement 10/08/2015  . 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 Morbid obesity with BMI of 40.0-44.9, adult ([place]) 09/23/2015  . Chronic prescription benzodiazepine use 09/23/2015  . Arthralgia 09/23/2015  . Osteoarthritis involving multiple joints 09/23/2015  . Other shoulder lesions (Right) 08/28/2015  . Cervical radiculitis 08/06/2015  . Fall 05/22/2015  . Opiate use (15 MME/Day) 01/16/2015  . Impingement syndrome of shoulder region 01/07/2015  . Mixed incontinence 11/26/2014  . Atrophic vaginitis 11/26/2014  . Anxiety and depression 09/21/2014  . Bladder cystocele 09/21/2014  . Gastro-esophageal reflux disease without esophagitis 09/21/2014  . DM (diabetes mellitus) type II, controlled, with peripheral vascular disorder ([place]) 08/31/2014  . Diabetic peripheral neuropathy associated with type 2 diabetes mellitus ([place]) 08/31/2014  . Asthma, well controlled 08/31/2014  . Hypothyroidism, unspecified 08/31/2014  . Peripheral vascular disease due to secondary diabetes mellitus ([place]) 12/11/2013  . Sleep apnea 10/19/2013  . Paroxysmal atrial fibrillation ([place]); CHA2DS2-Vasc Score 5. On Pradaxa 07/27/2013  . Essential hypertension 07/27/2013  . Chronic atrial fibrillation 08/10/2012    Past Surgical History:  Procedure Laterality Date  . ANKLE SURGERY Right   . APPLICATION OF WOUND VAC Left 06/27/2015   Procedure: APPLICATION OF WOUND VAC ( POSSIBLE ) ;  Surgeon: [name], MD;  Location: [facility] ORS;  Service: Vascular;  Laterality: Left;  . APPLICATION OF WOUND VAC Left 09/25/2016   Procedure: APPLICATION OF WOUND VAC;  Surgeon: [name], DPM;  Location: [facility] ORS;  Service: Podiatry;  Laterality: Left;  . arm surgery     right    . CHOLECYSTECTOMY    . COLONOSCOPY    . COLONOSCOPY WITH PROPOFOL N/A  01/11/2017   Procedure: COLONOSCOPY WITH PROPOFOL;  Surgeon: [name], MD;  Location[facility]COPY;  Service: Endoscopy;  Laterality: N/A;  . COLONOSCOPY WITH PROPOFOL N/A 02/22/2017   Procedure: COLONOSCOPY WITH PROPOFOL;  Surgeon: [name], MD;  Location: [facility] ENDOSCOPY;  Service: Endoscopy;  Laterality: N/A;  . COLONOSCOPY WITH PROPOFOL N/A 05/31/2017   Procedure: COLONOSCOPY WITH PROPOFOL;  Surgeon: [name], MD;  Location: [facility] ENDOSCOPY;  Service: Endoscopy;  Laterality: N/A;  . ESOPHAGOGASTRODUODENOSCOPY (EGD) WITH PROPOFOL N/A 01/11/2017   Procedure: ESOPHAGOGASTRODUODENOSCOPY (EGD) WITH PROPOFOL;  Surgeon: [name], MD;  Location: [facility] ENDOSCOPY;  Service: Endoscopy;  Laterality: N/A;  . ESOPHAGOGASTRODUODENOSCOPY (EGD) WITH PROPOFOL N/A 10/25/2018   Procedure: ESOPHAGOGASTRODUODENOSCOPY (EGD) WITH PROPOFOL;  Surgeon: [name], MD;  Location: [facility] ENDOSCOPY;  Service: Endoscopy;  Laterality: N/A;  . ESOPHAGOGASTRODUODENOSCOPY (EGD) WITH PROPOFOL N/A 11/29/2019   Procedure: ESOPHAGOGASTRODUODENOSCOPY (EGD) WITH PROPOFOL;  Surgeon: Toledo, Benay Pike, MD;  Location: [facility] ENDOSCOPY;  Service: Gastroenterology;  Laterality: N/A;  .  HERNIA REPAIR     umbilical  . HIATAL HERNIA REPAIR    . I & D EXTREMITY Left 06/27/2015   Procedure: IRRIGATION AND DEBRIDEMENT EXTREMITY            ( CALF HEMATOMA ) POSSIBLE WOUND VAC;  Surgeon: [name], MD;  Location: [facility] ORS;  Service: Vascular;  Laterality: Left;  . INCISION AND DRAINAGE OF WOUND Left 09/25/2016   Procedure: IRRIGATION AND DEBRIDEMENT - PARTIAL RESECTION OF ACHILLES TENDON WITH WOUND VAC APPLICATION;  Surgeon: [name], DPM;  Location: [facility] ORS;  Service: Podiatry;  Laterality: Left;  . INCISION AND DRAINAGE OF WOUND Left 11/09/2018   Procedure: Excision of left foot wound with ACell placement;  Surgeon: Wallace Going, DO;  Location: [place];  Service: Plastics;  Laterality: Left;  .  IRRIGATION AND DEBRIDEMENT ABSCESS Left 09/07/2016   Procedure: IRRIGATION AND DEBRIDEMENT ABSCESS LEFT HEEL;  Surgeon: [name], DPM;  Location: [facility] ORS;  Service: Podiatry;  Laterality: Left;  . JOINT REPLACEMENT  2013   left knee replacement  . KIDNEY SURGERY Right    kidney stones  . LITHOTRIPSY    . LOWER EXTREMITY ANGIOGRAPHY Left 04/04/2018   Procedure: LOWER EXTREMITY ANGIOGRAPHY;  Surgeon: [name], MD;  Location: [place] CV LAB;  Service: Cardiovascular;  Laterality: Left;  . NM GATED MYOVIEW ([place] HX)  February 2017   Likely breast attenuation. LOW RISK study. Normal EF 55-60%.  . OTHER SURGICAL HISTORY     08/2016 or 09/2016 surgery on achilles tenso h/o staph infection heal. Dr. [name]  . removal of left hematoma Left    leg  . REVERSE SHOULDER ARTHROPLASTY Right 10/08/2015   Procedure: REVERSE SHOULDER ARTHROPLASTY;  Surgeon: [name], MD;  Location: [facility] ORS;  Service: Orthopedics;  Laterality: Right;  . SLEEVE GASTROPLASTY    . TONSILLECTOMY    . TOTAL KNEE ARTHROPLASTY Left   . TOTAL SHOULDER REPLACEMENT Right   . TRANSESOPHAGEAL ECHOCARDIOGRAM  08/08/2013   Mild LVH, EF 60-65%. Moderate LA dilation and mild RA dilation. Mild MR with no evidence of stenosis and no evidence of endocarditis. A false chordae is noted.  . TRANSTHORACIC ECHOCARDIOGRAM  08/03/2013   Mild-Moderate concentric LVH, EF 60-65%. Normal diastolic function. Mild LA dilation. Mild MR with possible vegetation  - not confirmed on TEE   . ULNAR NERVE TRANSPOSITION Right 08/06/2016   Procedure: ULNAR NERVE DECOMPRESSION/TRANSPOSITION;  Surgeon: [name], MD;  Location: [facility] ORS;  Service: Orthopedics;  Laterality: Right;  . UPPER GI ENDOSCOPY    . VAGINAL HYSTERECTOMY      Prior to Admission medications   Medication Sig Start Date End Date Taking? Authorizing Provider  acetaminophen (TYLENOL) 325 MG tablet Take 2 tablets (650 mg total) by mouth every 6 (six) hours as needed for  mild pain or moderate pain (or Fever >/= 101). 09/08/19   Guilford Shi, MD  albuterol (PROVENTIL HFA;VENTOLIN HFA) 108 (90 Base) MCG/ACT inhaler Inhale 1-2 puffs into the lungs every 4 (four) hours as needed for wheezing or shortness of breath. Patient taking differently: Inhale 2 puffs into the lungs every 4 (four) hours as needed for wheezing or shortness of breath.  03/29/15   Bobetta Lime, MD  albuterol (PROVENTIL) (2.5 MG/3ML) 0.083% nebulizer solution Take 3 mLs (2.5 mg total) by nebulization every 6 (six) hours as needed for wheezing or shortness of breath. 02/21/15   Ashok Norris, MD  ALPRAZolam Duanne Moron) 0.5 MG tablet Take 0.5 tablets (  0.25 mg total) by mouth 2 (two) times daily as needed for anxiety. 10/27/19   [name], [name], MD  amLODipine (NORVASC) 5 MG tablet Take 1 tablet (5 mg total) by mouth daily. 11/28/19 02/26/20  Loel Dubonnet, NP  blood glucose meter kit and supplies KIT Dispense based on patient and insurance preference. Use up to 2 times daily as directed. (FOR ICD-10 E11.9). 09/14/19   Leone Haven, MD  buPROPion (WELLBUTRIN XL) 150 MG 24 hr tablet Take 1 tablet (150 mg total) by mouth daily. 05/02/19   [name], [name], MD  dabigatran (PRADAXA) 150 MG CAPS capsule Take 1 capsule (150 mg total) by mouth 2 (two) times daily. Please call to schedule office visit for further refills. Thank you! 10/05/19   Wellington Hampshire, MD  DULoxetine HCl 40 MG CPEP Take 40 mg by mouth daily. 11/22/19 12/22/19  Gillis Santa, MD  estradiol (ESTRACE) 0.1 MG/GM vaginal cream Place 1 Applicatorful vaginally every Monday, Wednesday, and Friday. Apply 0.19m (pea-sized amount)  just inside the vaginal introitus with a finger-tip on Monday, Wednesday and Friday nights, 02/01/19   MZara CouncilA, PA-C  fluticasone-salmeterol (ADVAIR HFA) 115-21 MCG/ACT inhaler Inhale 2 puffs into the lungs daily. Rinse mouth thoroughly after use 08/16/19   GTyler Pita MD    HYDROcodone-acetaminophen (NORCO/VICODIN) 5-325 MG tablet Take 1 tablet by mouth every 8 (eight) hours as needed for severe pain. Must last 30 days 11/25/19 12/25/19  LGillis Santa MD  HYDROcodone-acetaminophen (NORCO/VICODIN) 5-325 MG tablet Take 1 tablet by mouth every 8 (eight) hours as needed for severe pain. Must last 30 days 12/25/19 01/24/20  LGillis Santa MD  HYDROcodone-acetaminophen (NORCO/VICODIN) 5-325 MG tablet Take 1 tablet by mouth every 8 (eight) hours as needed for severe pain. Must last 30 days 01/24/20 02/23/20  LGillis Santa MD  Lancets (ONETOUCH DELICA PLUS [id number] MMathenyTEST UP TO TWICE DAILY 09/15/19   [provider]  levocetirizine (XYZAL) 5 MG tablet Take 0.5 tablets (2.5 mg total) by mouth every evening. 09/07/19   [name], TNino Glow MD  levofloxacin (LEVAQUIN) 500 MG tablet Take 500 mg by mouth daily. Patient not taking: Reported on 11/29/2019    [provider]  levothyroxine (SYNTHROID) 175 MCG tablet Take 1 tablet (175 mcg total) by mouth daily before breakfast. 03/10/19   [name], TNino Glow MD  losartan-hydrochlorothiazide (HYZAAR) 50-12.5 MG tablet Take 1 tablet by mouth daily. In am. D/c hctz 12.5 alone and d/c mobic use tylenol for pain as needed Patient not taking: Reported on 11/29/2019 11/10/19   [name], TNino Glow MD  metFORMIN (GLUCOPHAGE) 1000 MG tablet Take 1 tablet (1,000 mg total) by mouth 2 (two) times daily. With food 12/14/18   [name], TNino Glow MD  metoprolol succinate (TOPROL-XL) 50 MG 24 hr tablet TAKE 1 TABLET BY MOUTH DAILY 11/09/19   WLoel Dubonnet NP  montelukast (SINGULAIR) 10 MG tablet Take 1 tablet (10 mg total) by mouth at bedtime. 02/03/19   [name], TNino Glow MD  mupirocin ointment (BACTROBAN) 2 % Apply 1 application topically 2 (two) times daily. Skin wounds 10/04/17   [name], TNino Glow MD  nystatin cream (MYCOSTATIN) Apply 1 application topically 2 (two) times daily. Mouth prn for 7  days as needed Patient not taking: Reported on 11/29/2019 11/17/17   [name], TNino Glow MD  ondansetron (ZOFRAN) 4 MG tablet Take 1 tablet (4 mg total) by mouth every 8 (eight) hours as needed for nausea or vomiting. 02/14/19   [name],  [name], MD  [facility] ULTRA test strip TEST UP TO TWICE DAILY 09/15/19   [name], [name], MD  pantoprazole (PROTONIX) 40 MG tablet Take by mouth. 04/07/19 04/06/20  [provider]  pregabalin (LYRICA) 50 MG capsule Take 1 capsule (50 mg total) by mouth 3 (three) times daily. 11/22/19 05/20/20  Gillis Santa, MD  TOVIAZ 8 MG TB24 tablet TAKE ONE TABLET BY MOUTH EVERY DAY 08/07/19   McGowan, [name] A, PA-C  Vitamin D, Ergocalciferol, (DRISDOL) 1.25 MG (50000 UNIT) CAPS capsule Take 1 capsule (50,000 Units total) by mouth once a week. 09/15/19   Guilford Shi, MD  zinc oxide 20 % ointment Apply 1 application topically as needed for irritation. 09/02/17   Zara Council A, PA-C    Allergies Morphine and related, Penicillins, Ambien [zolpidem], Aspirin, and Oxytetracycline  Family History  Problem Relation Age of Onset  . Skin cancer Father   . Diabetes Father   . Hypertension Father   . Peripheral vascular disease Father   . Cancer Father   . Cerebral aneurysm Father   . Alcohol abuse Father   . Varicose Veins Mother   . Kidney disease Mother   . Arthritis Mother   . Mental illness Sister   . Cancer Maternal Aunt        breast  . Breast cancer Maternal Aunt   . Arthritis Maternal Grandmother   . Hypertension Maternal Grandmother   . Diabetes Maternal Grandmother   . Arthritis Maternal Grandfather   . Heart disease Maternal Grandfather   . Hypertension Maternal Grandfather   . Arthritis Paternal Grandmother   . Hypertension Paternal Grandmother   . Diabetes Paternal Grandmother   . Arthritis Paternal Grandfather   . Hypertension Paternal Grandfather   . Bladder Cancer Neg Hx   . Kidney cancer Neg Hx     Social  History Social History   Tobacco Use  . Smoking status: Never Smoker  . Smokeless tobacco: Never Used  Vaping Use  . Vaping Use: Never used  Substance Use Topics  . Alcohol use: No    Alcohol/week: 0.0 standard drinks  . Drug use: Never    Review of Systems  Constitutional: No fever/chills Eyes: No visual changes. ENT: No sore throat. Cardiovascular: Denies chest pain. Respiratory: Denies shortness of breath. Gastrointestinal: No abdominal pain.  No nausea, no vomiting.  No diarrhea.  No constipation. Genitourinary: Negative for dysuria. Musculoskeletal: Negative for back pain. Skin: Negative for rash. Neurological: Negative for headaches, focal weakness  ____________________________________________   PHYSICAL EXAM:  VITAL SIGNS: ED Triage Vitals  Enc Vitals Group     BP 12/19/19 1705 129/61     Pulse Rate 12/19/19 1705 65     Resp 12/19/19 1705 18     Temp 12/19/19 1705 98 F (36.7 C)     Temp src --      SpO2 12/19/19 1705 95 %     Weight 12/19/19 1702 254 lb 6.6 oz (115.4 kg)     Height 12/19/19 1702 '5\' 5"'  (1.651 m)     Head Circumference --      Peak Flow --      Pain Score 12/19/19 1702 7     Pain Loc --      Pain Edu? --      Excl. in [place]? --     Constitutional: Alert and oriented. Well appearing and in no acute distress. Eyes: Conjunctivae are normal. PER. Head: Atraumatic. Nose: No congestion/rhinnorhea. Mouth/Throat: Mucous membranes are  moist.  Oropharynx non-erythematous. Neck: No stridor. Cardiovascular: Normal rate, regular rhythm. Grossly normal heart sounds.  Good peripheral circulation. Respiratory: Normal respiratory effort.  No retractions. Lungs scattered wheezes Gastrointestinal: Soft and nontender. No distention. No abdominal bruits Musculoskeletal: No lower extremity tenderness slight trace bilateral edema.  . Neurologic:  Normal speech and language. No gross focal neurologic deficits are appreciated.  Skin:  Skin is warm, dry and  intact. No rash noted.   ____________________________________________   LABS (all labs ordered are listed, but only abnormal results are displayed)  Labs Reviewed  BASIC METABOLIC PANEL - Abnormal; Notable for the following components:      Result Value   Glucose, Bld 104 (*)    Creatinine, Ser 1.31 (*)    Calcium 8.7 (*)    GFR calc non Af Amer 41 (*)    GFR calc Af Amer 48 (*)    All other components within normal limits  CBC - Abnormal; Notable for the following components:   RBC 3.85 (*)    Hemoglobin 9.9 (*)    HCT 32.5 (*)    MCH 25.7 (*)    RDW 15.7 (*)    All other components within normal limits  LACTIC ACID, PLASMA - Abnormal; Notable for the following components:   Lactic Acid, Venous 2.0 (*)    All other components within normal limits  HEPATIC FUNCTION PANEL - Abnormal; Notable for the following components:   AST 14 (*)    All other components within normal limits  CBC WITH DIFFERENTIAL/PLATELET - Abnormal; Notable for the following components:   RBC 3.75 (*)    Hemoglobin 9.7 (*)    HCT 31.6 (*)    MCH 25.9 (*)    RDW 15.6 (*)    All other components within normal limits  SEDIMENTATION RATE - Abnormal; Notable for the following components:   Sed Rate 49 (*)    All other components within normal limits  URINE CULTURE  URINALYSIS, COMPLETE (UACMP) WITH MICROSCOPIC  LACTIC ACID, PLASMA  URINALYSIS, COMPLETE (UACMP) WITH MICROSCOPIC  CBG MONITORING, ED  TROPONIN I (HIGH SENSITIVITY)  TROPONIN I (HIGH SENSITIVITY)   ____________________________________________  EKG  EKG read interpreted by me shows first-degree AV block rate of 57 normal axis no obvious acute changes.  EKG looks similar to 1 from 11/28/2019 ____________________________________________  RADIOLOGY  ED MD interpretation: Dust x-ray read by radiology reviewed by me does not show any acute changes, foot x-ray also does not show any acute changes  Official radiology report(s): DG Chest  Portable 1 View  Result Date: 12/19/2019 CLINICAL DATA:  Generalized weakness. EXAM: PORTABLE CHEST 1 VIEW COMPARISON:  November 30, 2019 FINDINGS: There is stable elevation of the right hemidiaphragm with subsequently decreased lung volumes noted on the right. Mild, diffuse chronic appearing increased lung markings are noted. Very mild atelectasis is seen within the right lung base. There is no evidence of a pleural effusion or pneumothorax. The heart size and mediastinal contours are within normal limits. An intact right shoulder replacement is noted. IMPRESSION: Chronic appearing increased lung markings with very mild right basilar atelectasis. Electronically Signed   By: Virgina Norfolk M.D.   On: 12/19/2019 21:49   DG Foot Complete Left  Result Date: 12/19/2019 CLINICAL DATA:  Healing ulcer. EXAM: LEFT FOOT - COMPLETE 3+ VIEW COMPARISON:  September 03, 2019 FINDINGS: There is no evidence of an acute fracture or dislocation. Chronic fracture deformities are seen involving the proximal phalanges of the second and third  left toes. Mild degenerative changes are seen along the dorsal aspect of the mid left foot. A moderate-sized plantar calcaneal spur is seen. Mild to moderate severity soft tissue swelling is seen along the plantar aspect of the left foot. IMPRESSION: 1. Chronic fracture deformities of the proximal phalanges of the second and third left toes. 2. Degenerative changes, without evidence of acute osseous abnormality. Electronically Signed   By: Virgina Norfolk M.D.   On: 12/19/2019 21:47    ____________________________________________   PROCEDURES  Procedure(s) performed (including Critical Care):  Procedures   ____________________________________________   INITIAL IMPRESSION / ASSESSMENT AND PLAN / ED COURSE  Patient is gotten almost a liter of fluid but still at 11:45 PM has not produced any urine.  Nurse will try to see what we can get she has a pure wick in place.  We may need  to put some more fluid in her.  In the meantime I will sign the patient out.             ____________________________________________   FINAL CLINICAL IMPRESSION(S) / ED DIAGNOSES  Final diagnoses:  Weakness  Pain of foot, unspecified laterality     ED Discharge Orders    None      *Please note:  [name] was evaluated in Emergency Department on 12/19/2019 for the symptoms described in the history of present illness. She was evaluated in the context of the global COVID-19 pandemic, which necessitated consideration that the patient might be at risk for infection with the SARS-CoV-2 virus that causes COVID-19. Institutional protocols and algorithms that pertain to the evaluation of patients at risk for COVID-19 are in a state of rapid change based on information released by regulatory bodies including the CDC and federal and state organizations. These policies and algorithms were followed during the patient's care in the ED.  Some ED evaluations and interventions may be delayed as a result of limited staffing during and the pandemic.*   Note:  This document was prepared using Dragon voice recognition software and may include unintentional dictation errors.    Nena Polio, MD 12/19/19 [id number]

## 2019-12-19 NOTE — ED Triage Notes (Signed)
Pt comes into the ED via EMS from home with c/o generalized weakness, unable to stand her husband is unable to care for her due to the weakness. States the last time this happen she was septic. ; VSS.

## 2019-12-19 NOTE — Discharge Instructions (Addendum)
Please get your podiatrist to check on your foot in the next couple days.  The lab work has not been worrisome.  I think you should be okay at home as long as you can follow-up for your foot.  Please return here if anything including the foot gets worse.

## 2019-12-19 NOTE — Telephone Encounter (Signed)
Agree  Dr. De Borgia with ED

## 2019-12-20 ENCOUNTER — Telehealth: Payer: Self-pay | Admitting: Internal Medicine

## 2019-12-20 ENCOUNTER — Telehealth: Payer: PPO | Admitting: Internal Medicine

## 2019-12-20 ENCOUNTER — Emergency Department: Payer: PPO

## 2019-12-20 ENCOUNTER — Other Ambulatory Visit: Payer: Self-pay

## 2019-12-20 DIAGNOSIS — Z6841 Body Mass Index (BMI) 40.0 and over, adult: Secondary | ICD-10-CM

## 2019-12-20 DIAGNOSIS — L89611 Pressure ulcer of right heel, stage 1: Secondary | ICD-10-CM | POA: Diagnosis present

## 2019-12-20 DIAGNOSIS — I10 Essential (primary) hypertension: Secondary | ICD-10-CM | POA: Diagnosis present

## 2019-12-20 DIAGNOSIS — I6340 Cerebral infarction due to embolism of unspecified cerebral artery: Principal | ICD-10-CM | POA: Diagnosis present

## 2019-12-20 DIAGNOSIS — F32A Depression, unspecified: Secondary | ICD-10-CM | POA: Diagnosis present

## 2019-12-20 DIAGNOSIS — R4702 Dysphasia: Secondary | ICD-10-CM | POA: Diagnosis present

## 2019-12-20 DIAGNOSIS — R52 Pain, unspecified: Secondary | ICD-10-CM | POA: Diagnosis not present

## 2019-12-20 DIAGNOSIS — Z96652 Presence of left artificial knee joint: Secondary | ICD-10-CM | POA: Diagnosis present

## 2019-12-20 DIAGNOSIS — G894 Chronic pain syndrome: Secondary | ICD-10-CM | POA: Diagnosis present

## 2019-12-20 DIAGNOSIS — R29707 NIHSS score 7: Secondary | ICD-10-CM | POA: Diagnosis present

## 2019-12-20 DIAGNOSIS — Z96611 Presence of right artificial shoulder joint: Secondary | ICD-10-CM | POA: Diagnosis present

## 2019-12-20 DIAGNOSIS — Z79899 Other long term (current) drug therapy: Secondary | ICD-10-CM

## 2019-12-20 DIAGNOSIS — I482 Chronic atrial fibrillation, unspecified: Secondary | ICD-10-CM | POA: Diagnosis present

## 2019-12-20 DIAGNOSIS — K219 Gastro-esophageal reflux disease without esophagitis: Secondary | ICD-10-CM | POA: Diagnosis present

## 2019-12-20 DIAGNOSIS — Z79891 Long term (current) use of opiate analgesic: Secondary | ICD-10-CM

## 2019-12-20 DIAGNOSIS — E1151 Type 2 diabetes mellitus with diabetic peripheral angiopathy without gangrene: Secondary | ICD-10-CM | POA: Diagnosis present

## 2019-12-20 DIAGNOSIS — R32 Unspecified urinary incontinence: Secondary | ICD-10-CM | POA: Diagnosis present

## 2019-12-20 DIAGNOSIS — R410 Disorientation, unspecified: Secondary | ICD-10-CM | POA: Diagnosis not present

## 2019-12-20 DIAGNOSIS — R296 Repeated falls: Secondary | ICD-10-CM | POA: Diagnosis present

## 2019-12-20 DIAGNOSIS — J449 Chronic obstructive pulmonary disease, unspecified: Secondary | ICD-10-CM | POA: Diagnosis present

## 2019-12-20 DIAGNOSIS — Z86718 Personal history of other venous thrombosis and embolism: Secondary | ICD-10-CM

## 2019-12-20 DIAGNOSIS — E785 Hyperlipidemia, unspecified: Secondary | ICD-10-CM | POA: Diagnosis present

## 2019-12-20 DIAGNOSIS — E1142 Type 2 diabetes mellitus with diabetic polyneuropathy: Secondary | ICD-10-CM | POA: Diagnosis present

## 2019-12-20 DIAGNOSIS — Z7984 Long term (current) use of oral hypoglycemic drugs: Secondary | ICD-10-CM

## 2019-12-20 DIAGNOSIS — F419 Anxiety disorder, unspecified: Secondary | ICD-10-CM | POA: Diagnosis present

## 2019-12-20 DIAGNOSIS — E039 Hypothyroidism, unspecified: Secondary | ICD-10-CM | POA: Diagnosis present

## 2019-12-20 DIAGNOSIS — M109 Gout, unspecified: Secondary | ICD-10-CM | POA: Diagnosis present

## 2019-12-20 DIAGNOSIS — G8194 Hemiplegia, unspecified affecting left nondominant side: Secondary | ICD-10-CM | POA: Diagnosis present

## 2019-12-20 DIAGNOSIS — Z20822 Contact with and (suspected) exposure to covid-19: Secondary | ICD-10-CM | POA: Diagnosis present

## 2019-12-20 DIAGNOSIS — Z79890 Hormone replacement therapy (postmenopausal): Secondary | ICD-10-CM

## 2019-12-20 DIAGNOSIS — Z7901 Long term (current) use of anticoagulants: Secondary | ICD-10-CM

## 2019-12-20 DIAGNOSIS — Z90710 Acquired absence of both cervix and uterus: Secondary | ICD-10-CM

## 2019-12-20 DIAGNOSIS — W19XXXA Unspecified fall, initial encounter: Secondary | ICD-10-CM | POA: Diagnosis not present

## 2019-12-20 LAB — URINALYSIS, COMPLETE (UACMP) WITH MICROSCOPIC
Bilirubin Urine: NEGATIVE
Glucose, UA: NEGATIVE mg/dL
Hgb urine dipstick: NEGATIVE
Ketones, ur: NEGATIVE mg/dL
Leukocytes,Ua: NEGATIVE
Nitrite: NEGATIVE
Protein, ur: NEGATIVE mg/dL
Specific Gravity, Urine: 1.009 (ref 1.005–1.030)
pH: 5 (ref 5.0–8.0)

## 2019-12-20 LAB — URINE CULTURE

## 2019-12-20 LAB — LACTIC ACID, PLASMA: Lactic Acid, Venous: 1.1 mmol/L (ref 0.5–1.9)

## 2019-12-20 LAB — TROPONIN I (HIGH SENSITIVITY): Troponin I (High Sensitivity): 7 ng/L (ref <18)

## 2019-12-20 NOTE — ED Notes (Signed)
Pt given phone to call husband/ride

## 2019-12-20 NOTE — ED Triage Notes (Signed)
Pt comes into the ED via EMS from home, pt was seen here yesterday for weakness and possible home health or rehab, but was discharged home. Pt left shoulder upper arm is tender from her husband having to pull constantly to try to assist the pt. Pt does appear to be some what confused.

## 2019-12-20 NOTE — ED Provider Notes (Signed)
-----------------------------------------   3:17 AM on 12/20/2019 -----------------------------------------  I took over care on this patient from Dr. Cinda Quest.  The patient presented with generalized weakness and was concerned she could be septic.  At the time of signout, the patient's work-up was overall reassuring and the plan was to send her home.  Only the urinalysis was outstanding.  The patient was able to provide a urine sample, and it is negative for acute findings.  There is no evidence of UTI.  The initial lactate was borderline elevated, although clinically there is no evidence of sepsis.  I will obtain a repeat after the fluid bolus just to make sure that this clears.  I anticipate discharge home as per Dr. Sonny Dandy original plan.  ----------------------------------------- 5:36 AM on 12/20/2019 -----------------------------------------  Repeat lactate is normal.  The patient's vital signs remain stable.  She has been sleeping comfortably for the last several hours.  On reassessment, I counseled her on the results of the work-up.  She states that she feels comfortable going home.  Return precautions given, and she expresses understanding.     Arta Silence, MD 12/20/19 [phone]

## 2019-12-20 NOTE — ED Notes (Addendum)
Called lab to draw blood after this RN attempted x2

## 2019-12-20 NOTE — ED Triage Notes (Signed)
Pt in via EMS from home with c/o fall. Pt was seen yesterday for the same and d/c home. Pt fell again today. Pt is not cooperative and does not want to be touched. Pt had previous physical rehab scheduled over the summer but the husband cancelled it because he did not like the person. Pt is not able to assist

## 2019-12-20 NOTE — Telephone Encounter (Signed)
Called to start Patient's 1:00 pm telephone visit. Patient's husband answered the phone and states that the EMS were at their house again. Patient was in the ED last night but was not evaluated for cognitive changes and weakness.    Patient's husband states that the patient is unable to move on her own at all. States that every time he wants to move her or help her to the bathroom he has been calling the EMS.     I informed the Patient's husband that she will need to be brought back to the ED. Spoke with Dr Olivia Mackie [name] and she agrees. States that she has sent a message to the ED provider's from last night that Patient was advised to go back so that she may be placed in a rehab facility for physical and occupational therapy.    Patient did not want to go but was convinced by her husband and EMS on sight. Patient will return via EMS to the ED. Was advised to inform them that she is no longer able to care for herself physically even with the aide of her husband.

## 2019-12-20 NOTE — Telephone Encounter (Signed)
Pt was supposed to have f/u today with me at 1 PM telephone due to so weak cant walk to even go to bathroom at home and husband unable to lift her and unable to even come to the visit today due to unable to get out of bed or walk  EMS keeps coming to home to help the husband do ADLS ie get up to go to the bathroom  I think she needs SNF and to be placed for aggressive PT/OT so sent ED for placement and unable to do ADLS  Also consider CT head for weakness   Im advising she come back to the ED as EMS is at the home now trying to help her go the the bathroom and so weak she cant  arianna CMA advised pt and husband of this today 1:10 pm 12/20/19

## 2019-12-20 NOTE — Telephone Encounter (Signed)
Called to start Patient's 1:00 pm telephone visit. Patient's husband answered the phone and states that the EMS were at their house again. Patient was in the ED last night but was not evaluated for cognitive changes and weakness.   Patient's husband states that the patient is unable to move on her own at all. States that every time he wants to move her or help her to the bathroom he has been calling the EMS.    I informed the Patient's husband that she will need to be brought back to the ED. Spoke with Dr Olivia Mackie [name] and she agrees. States that she has sent a message to the ED provider's from last night that Patient was advised to go back so that she may be placed in a rehab facility for physical and occupational therapy.   Patient did not want to go but was convinced by her husband and EMS on sight. Patient will return via EMS to the ED. Was advised to inform them that she is no longer able to care for herself physically even with the aide of her husband.

## 2019-12-21 ENCOUNTER — Emergency Department: Payer: PPO

## 2019-12-21 ENCOUNTER — Telehealth: Payer: Self-pay | Admitting: Internal Medicine

## 2019-12-21 ENCOUNTER — Inpatient Hospital Stay
Admission: EM | Admit: 2019-12-21 | Discharge: 2019-12-27 | DRG: 065 | Disposition: A | Discharge status: Skilled Nursing Facility | Payer: PPO | Attending: Internal Medicine | Admitting: Internal Medicine

## 2019-12-21 DIAGNOSIS — G894 Chronic pain syndrome: Secondary | ICD-10-CM

## 2019-12-21 DIAGNOSIS — I480 Paroxysmal atrial fibrillation: Secondary | ICD-10-CM | POA: Diagnosis present

## 2019-12-21 DIAGNOSIS — E66813 Obesity, class 3: Secondary | ICD-10-CM

## 2019-12-21 DIAGNOSIS — I639 Cerebral infarction, unspecified: Secondary | ICD-10-CM | POA: Diagnosis present

## 2019-12-21 DIAGNOSIS — Z7901 Long term (current) use of anticoagulants: Secondary | ICD-10-CM

## 2019-12-21 DIAGNOSIS — R531 Weakness: Secondary | ICD-10-CM

## 2019-12-21 DIAGNOSIS — J4541 Moderate persistent asthma with (acute) exacerbation: Secondary | ICD-10-CM

## 2019-12-21 DIAGNOSIS — M797 Fibromyalgia: Secondary | ICD-10-CM

## 2019-12-21 DIAGNOSIS — E1142 Type 2 diabetes mellitus with diabetic polyneuropathy: Secondary | ICD-10-CM

## 2019-12-21 DIAGNOSIS — I6340 Cerebral infarction due to embolism of unspecified cerebral artery: Secondary | ICD-10-CM

## 2019-12-21 MED ORDER — HYDROCODONE-ACETAMINOPHEN 5-325 MG PO TABS
1.0000 | ORAL_TABLET | Freq: Three times a day (TID) | ORAL | Status: DC | PRN
  Administered 2019-12-22 – 2019-12-26 (×5): 1 via ORAL
  Filled 2019-12-21 (×5): qty 1

## 2019-12-21 MED ORDER — DABIGATRAN ETEXILATE MESYLATE 150 MG PO CAPS
150.0000 mg | ORAL_CAPSULE | Freq: Two times a day (BID) | ORAL | Status: DC
  Administered 2019-12-21 – 2019-12-22 (×3): 150 mg via ORAL
  Filled 2019-12-21 (×6): qty 1

## 2019-12-21 MED ORDER — HYDROCHLOROTHIAZIDE 12.5 MG PO CAPS
12.5000 mg | ORAL_CAPSULE | Freq: Every day | ORAL | Status: DC
  Administered 2019-12-21 – 2019-12-27 (×7): 12.5 mg via ORAL
  Filled 2019-12-21 (×7): qty 1

## 2019-12-21 MED ORDER — AMLODIPINE BESYLATE 5 MG PO TABS
5.0000 mg | ORAL_TABLET | Freq: Every day | ORAL | Status: DC
  Administered 2019-12-21 – 2019-12-27 (×7): 5 mg via ORAL
  Filled 2019-12-21 (×7): qty 1

## 2019-12-21 MED ORDER — DULOXETINE HCL 60 MG PO CPEP
60.0000 mg | ORAL_CAPSULE | Freq: Every day | ORAL | Status: DC
  Administered 2019-12-21 – 2019-12-27 (×7): 60 mg via ORAL
  Filled 2019-12-21 (×6): qty 1

## 2019-12-21 MED ORDER — METFORMIN HCL 500 MG PO TABS
500.0000 mg | ORAL_TABLET | Freq: Two times a day (BID) | ORAL | Status: DC
  Administered 2019-12-21 (×2): 1000 mg via ORAL
  Administered 2019-12-22: 500 mg via ORAL
  Administered 2019-12-22: 1000 mg via ORAL
  Filled 2019-12-21: qty 2
  Filled 2019-12-21: qty 1
  Filled 2019-12-21: qty 2
  Filled 2019-12-21: qty 1
  Filled 2019-12-21: qty 2

## 2019-12-21 MED ORDER — METOPROLOL SUCCINATE ER 50 MG PO TB24
50.0000 mg | ORAL_TABLET | Freq: Every day | ORAL | Status: DC
  Administered 2019-12-21 – 2019-12-26 (×6): 50 mg via ORAL
  Filled 2019-12-21 (×7): qty 1

## 2019-12-21 MED ORDER — PANTOPRAZOLE SODIUM 40 MG PO TBEC
40.0000 mg | DELAYED_RELEASE_TABLET | Freq: Every day | ORAL | Status: DC
  Administered 2019-12-21 – 2019-12-27 (×7): 40 mg via ORAL
  Filled 2019-12-21 (×7): qty 1

## 2019-12-21 MED ORDER — PREGABALIN 50 MG PO CAPS
50.0000 mg | ORAL_CAPSULE | Freq: Three times a day (TID) | ORAL | Status: DC
  Administered 2019-12-21 – 2019-12-27 (×20): 50 mg via ORAL
  Filled 2019-12-21 (×9): qty 1
  Filled 2019-12-21: qty 2
  Filled 2019-12-21 (×5): qty 1
  Filled 2019-12-21: qty 2
  Filled 2019-12-21 (×2): qty 1
  Filled 2019-12-21: qty 2
  Filled 2019-12-21: qty 1

## 2019-12-21 MED ORDER — OXYBUTYNIN CHLORIDE ER 5 MG PO TB24
10.0000 mg | ORAL_TABLET | Freq: Every day | ORAL | Status: DC
  Administered 2019-12-21 – 2019-12-26 (×6): 10 mg via ORAL
  Filled 2019-12-21: qty 2
  Filled 2019-12-21: qty 1
  Filled 2019-12-21: qty 2
  Filled 2019-12-21 (×3): qty 1
  Filled 2019-12-21: qty 2
  Filled 2019-12-21 (×2): qty 1

## 2019-12-21 MED ORDER — SUCRALFATE 1 GM/10ML PO SUSP
1.0000 g | Freq: Once | ORAL | Status: AC
  Administered 2019-12-21: 1 g via ORAL
  Filled 2019-12-21: qty 10

## 2019-12-21 MED ORDER — CETIRIZINE HCL 10 MG PO TABS
5.0000 mg | ORAL_TABLET | Freq: Every evening | ORAL | Status: DC
  Administered 2019-12-21 – 2019-12-25 (×4): 5 mg via ORAL
  Filled 2019-12-21 (×9): qty 1

## 2019-12-21 MED ORDER — MOMETASONE FURO-FORMOTEROL FUM 200-5 MCG/ACT IN AERO
2.0000 | INHALATION_SPRAY | Freq: Two times a day (BID) | RESPIRATORY_TRACT | Status: DC
  Administered 2019-12-22 – 2019-12-27 (×10): 2 via RESPIRATORY_TRACT
  Filled 2019-12-21: qty 8.8

## 2019-12-21 MED ORDER — MONTELUKAST SODIUM 10 MG PO TABS
10.0000 mg | ORAL_TABLET | Freq: Every day | ORAL | Status: DC
  Administered 2019-12-21 – 2019-12-26 (×6): 10 mg via ORAL
  Filled 2019-12-21 (×7): qty 1

## 2019-12-21 MED ORDER — LOSARTAN POTASSIUM-HCTZ 50-12.5 MG PO TABS: 1.0000 | ORAL_TABLET | Freq: Every day | ORAL | Status: DC

## 2019-12-21 MED ORDER — ALPRAZOLAM 0.5 MG PO TABS: 0.2500 mg | ORAL_TABLET | Freq: Two times a day (BID) | ORAL | Status: DC | PRN

## 2019-12-21 MED ORDER — SUCRALFATE 1 G PO TABS: 1.0000 g | ORAL_TABLET | Freq: Once | ORAL | Status: DC

## 2019-12-21 MED ORDER — BUPROPION HCL ER (XL) 150 MG PO TB24
150.0000 mg | ORAL_TABLET | Freq: Every day | ORAL | Status: DC
  Administered 2019-12-21 – 2019-12-26 (×6): 150 mg via ORAL
  Filled 2019-12-21 (×6): qty 1

## 2019-12-21 MED ORDER — LEVOTHYROXINE SODIUM 175 MCG PO TABS
175.0000 ug | ORAL_TABLET | Freq: Every day | ORAL | Status: DC
  Administered 2019-12-21 – 2019-12-27 (×6): 175 ug via ORAL
  Filled 2019-12-21 (×4): qty 1
  Filled 2019-12-21: qty 3.5
  Filled 2019-12-21 (×2): qty 1
  Filled 2019-12-21: qty 4
  Filled 2019-12-21: qty 1

## 2019-12-21 MED ORDER — LOSARTAN POTASSIUM 50 MG PO TABS
50.0000 mg | ORAL_TABLET | Freq: Every day | ORAL | Status: DC
  Administered 2019-12-21 – 2019-12-27 (×7): 50 mg via ORAL
  Filled 2019-12-21 (×7): qty 1

## 2019-12-21 NOTE — ED Notes (Signed)
This RN to bedside, suction cannister emptied by this RN. Pt repositioned in bed, lights remain dimmed for patient comfort. Pt denies any needs. Call bell remains within reach of patient at this time.

## 2019-12-21 NOTE — ED Notes (Signed)
Pt taken to CT at this time.

## 2019-12-21 NOTE — ED Provider Notes (Signed)
Vitals:   12/21/19 1133 12/21/19 1434  BP: 139/81 (!) 144/59  Pulse: 76 72  Resp: (!) 22 20  Temp:    SpO2: 95% 96%     Patient and her husband at bedside, discussed with them it does not seem that going home is a safe option as she requires assistance from more than her husband, and more than he can provide at home at this point.  She may be able to go to a skilled nursing facility, but had previously not wanted, but after discussing with both patient and her husband seems that her reality is that this would likely be beneficial.  She is now agreeable to considering going to skilled nursing facilities.  Nursing reaching back out to her caseworker to facilitate this request, and I have also reexamined her she complains of pain over the mid to upper left humerus.  Shoulder imaging reassuring.  Will obtain CT scan of the left humerus to exclude fracture given ongoing pain with movement.  No swelling hematoma or erythema denoted.  Good use of the left hand.  Normal capillary refill.  She is fully awake and alert.   Delman Kitten, MD 12/21/19 1450

## 2019-12-21 NOTE — ED Provider Notes (Signed)
Care assigned to Dr. Cranston Neighbor  Follow-up on disposition recommendations with coordination with case management as well as CT of the left humerus and CT of the head   Delman Kitten, MD 12/21/19 1532

## 2019-12-21 NOTE — Telephone Encounter (Signed)
Called and spoke with Patient's husband. He is currently at the hospital with his wife. They are waiting for a case worker to come and speak with him about placing Patient in a rehab facility. He states the Patient will not want to go. I informed him that her going home is not a good idea as they can not care for her there.   He verbalized understanding and states he will convince her to go. He states they have not done imaging on the Patient's head. Informed him that Dr Olivia Mackie [name] sent a message yesterday to the doctors that she feels a head CT should be done. He states he will discuss this with the doctors when they come back in the room.

## 2019-12-21 NOTE — Telephone Encounter (Signed)
Pt's husband called and states that he would please like a call back about pt being in the hospital. Pt is unable to get in contact with wife at the hospital and he is worried. He states that pt called him at 3am and was confused and saying that she is in a coma. He is worried that she has been having mini strokes-droopy left eye and weak left arm. He states that he has been waiting on a nurse to call him back from the ED for a long time now. Please advise at your earliest convenience.

## 2019-12-21 NOTE — ED Notes (Signed)
Lunch tray placed at bedside. Pt's husband remains at bedside with patient at this time.

## 2019-12-21 NOTE — ED Notes (Signed)
Medications administered as ordered by Bubba Hales, RN. Pt moved to room 2, pericare performed by this RN and Bubba Hales, Therapist, sports. Purewick placed. Pt tolerated well.

## 2019-12-21 NOTE — TOC Progression Note (Signed)
Transition of Care [facility]) - Progression Note    Patient Details  Name: STEPAHNIE CAMPO MRN: [id number] Date of Birth: 05-14-1950  Transition of Care [facility]) CM/SW [place], [place] Phone Number: [phone] 12/21/2019, 4:17 PM  Clinical Narrative:     CSW gave Medicare.gov SNF list to patient and spouse.  Expected Discharge Plan: Skilled Nursing Facility Barriers to Discharge: Continued Medical Work up  Expected Discharge Plan and Services Expected Discharge Plan: [place] In-house Referral: Clinical Social Work   Post Acute Care Choice: [place] arrangements for the past 2 months: [place] Determinants of Health (SDOH) Interventions    Readmission Risk Interventions Readmission Risk Prevention Plan 07/30/2018  Transportation Screening Complete  PCP or Specialist Appt within 3-5 Days Complete  HRI or Home Care Consult Complete  Social Work Consult for [place] Planning/Counseling Patient refused  Palliative Care Screening Patient Refused  Medication Review Press photographer) Complete  Some recent data might be hidden

## 2019-12-21 NOTE — ED Notes (Signed)
Pt given meal tray and eating independently at this time. Pt denies needing assistance and denies further needs at this time. Call bell in reach and pt verbalized understanding to press if assistance is needed.

## 2019-12-21 NOTE — ED Notes (Signed)
This RN to bedside at this time. Introduced self to pt at this time. Pt denies further needs at this time and appears NAD.

## 2019-12-21 NOTE — TOC Initial Note (Signed)
Transition of Care [facility]) - Initial/Assessment Note    Patient Details  Name: [name] MRN: [id number] Date of Birth: 09/22/1950  Transition of Care [facility]) CM/SW Contact:    Ova Freshwater Phone Number: [phone] 12/21/2019, 12:29 PM  Clinical Narrative:                  Patient presents to [facility] ED due to bilateral leg weakness.  [place] lives at home with Rainee Sweatt [phone], spouse.  Patient needs assistance with most ADLs, including bathing, dressing and she is incontinent.  Patient's spouse takes her to doctors appointments, although patient feels she is able to drive without issues.  Patient uses walker to ambulate. Patient stated she did not want to go to a SNF plaecment regardless of PT recommendations.  Patient stated she wanted to [facility].  CSW spoke with PT and she stated the patient experienced severe pain and lack of mobility in her left arm.  PT stated patient may need an MRI since she was in a lot of pain when PT touched her left arm.    Expected Discharge Plan: Skilled Nursing Facility Barriers to Discharge: Continued Medical Work up   Patient Goals and CMS Choice Patient states their goals for this hospitalization and ongoing recovery are:: "I dont; want to go to a SNF, I want to go home with home health." CMS Medicare.gov Compare Post Acute Care list provided to:: Patient Choice offered to / list presented to : Patient  Expected Discharge Plan and Services Expected Discharge Plan: [place] In-house Referral: Clinical Social Work   Post Acute Care Choice: [place] arrangements for the past 2 months: [place]                                      Prior Living Arrangements/Services Living arrangements for the past 2 months: Single Family Home Lives with:: Spouse Patient language and need for interpreter reviewed:: Yes Do you feel safe going back  to the place where you live?: Yes      Need for Family Participation in Patient Care: Yes (Comment) Care giver support system in place?: Yes (comment)   Criminal Activity/Legal Involvement Pertinent to Current Situation/Hospitalization: No - Comment as needed  Activities of Daily Living      Permission Sought/Granted Permission sought to share information with : Facility Sport and exercise psychologist    Share Information with NAME: Calmes,Jeffrey (Spouse) [phone]  Permission granted to share info w AGENCY: [facility]        Emotional Assessment Appearance:: Appears older than stated age Attitude/Demeanor/Rapport: Engaged Affect (typically observed): Anxious Orientation: : Oriented to Self, Oriented to Place, Oriented to  Time, Oriented to Situation Alcohol / Substance Use: Not Applicable Psych Involvement: No (comment)  Admission diagnosis:  mobility changes Patient Active Problem List   Diagnosis Date Noted  . Chronic bacteriuria 11/17/2019  . Hyperkalemia 09/14/2019  . Bacteremia due to Gram-positive bacteria 09/08/2019  . Sepsis ([place]) 09/08/2019  . Toxic encephalopathy 09/08/2019  . Pressure injury of skin 09/04/2019  . Elevated troponin 09/02/2019  . COPD with acute exacerbation ([place]) 09/02/2019  . Acute respiratory failure with hypoxia ([place]) 09/02/2019  . Pharmacologic therapy 08/30/2019  . Recurrent falls 07/10/2019  . Left ankle pain 06/02/2019  . Chronic pain of right ankle 02/14/2019  . Arthritis 02/14/2019  .  Nausea and vomiting 02/14/2019  . Meralgia paresthetica of right side 12/05/2018  . Chronic anticoagulation ([place]) 12/05/2018  . Peripheral vascular disease ([place]) 10/18/2018  . Chronic ulcer of right leg ([place]) 10/18/2018  . Fibromyalgia 08/17/2018  . Chronic musculoskeletal pain 08/17/2018  . Tremor of right hand 08/03/2018  . Overactive bladder 08/03/2018  . Physical deconditioning 08/03/2018  . Foot ulcer, left ([place]) 03/09/2018  .  Adrenal mass ([place]) 08/02/2017  . Eczema 06/27/2017  . Diabetic ulcer of left foot ([place]) 06/24/2017  . Mass of both adrenal glands ([place]) 05/16/2017  . Fatty liver 05/13/2017  . Strain of right hip 04/30/2017  . Ischial pain, right 04/30/2017  . Pain of left hip joint 04/22/2017  . Abnormality of gait and mobility 04/22/2017  . Muscle strain of left hip 04/09/2017  . Anemia 11/18/2016  . Asthma 11/18/2016  . Carpal tunnel syndrome 11/18/2016  . Pain in joint involving ankle and foot 11/18/2016  . Obstructive sleep apnea of adult 11/18/2016  . Spinal stenosis of thoracic region 11/18/2016  . Abscess of tendon of left foot 09/21/2016  . Cellulitis 09/05/2016  . Cubital tunnel syndrome on right 08/07/2016  . Chronic pain syndrome 02/25/2016  . Long term current use of opiate analgesic 02/25/2016  . Chronic right shoulder pain 02/25/2016  . CAP (community acquired pneumonia) 10/11/2015  . Status post reverse total shoulder replacement 10/08/2015  . 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 Morbid obesity with BMI of 40.0-44.9, adult ([place]) 09/23/2015  . Chronic prescription benzodiazepine use 09/23/2015  . Arthralgia 09/23/2015  . Osteoarthritis involving multiple joints 09/23/2015  . Other shoulder lesions (Right) 08/28/2015  . Cervical radiculitis 08/06/2015  . Fall 05/22/2015  . Opiate use (15 MME/Day) 01/16/2015  . Impingement syndrome of shoulder region 01/07/2015  . Mixed incontinence 11/26/2014  . Atrophic vaginitis 11/26/2014  . Anxiety and depression 09/21/2014  . Bladder cystocele 09/21/2014  . Gastro-esophageal reflux disease without esophagitis 09/21/2014  . DM (diabetes mellitus) type II, controlled, with peripheral vascular disorder ([place]) 08/31/2014  . Diabetic peripheral neuropathy associated with type 2 diabetes mellitus ([place]) 08/31/2014  . Asthma, well controlled 08/31/2014  . Hypothyroidism, unspecified 08/31/2014  . Peripheral vascular  disease due to secondary diabetes mellitus ([place]) 12/11/2013  . Sleep apnea 10/19/2013  . Paroxysmal atrial fibrillation ([place]); CHA2DS2-Vasc Score 5. On Pradaxa 07/27/2013  . Essential hypertension 07/27/2013  . Chronic atrial fibrillation 08/10/2012   PCP:  [name], [name], MD Pharmacy:   [place], [place] - [address] [place] [address] Phone: [phone] Fax: [phone]     Social Determinants of Health (SDOH) Interventions    Readmission Risk Interventions Readmission Risk Prevention Plan 07/30/2018  Transportation Screening Complete  PCP or Specialist Appt within 3-5 Days Complete  HRI or [place] Complete  Social Work Consult for [place] Planning/Counseling Patient refused  Palliative Care Screening Patient Refused  Medication Review Press photographer) Complete  Some recent data might be hidden

## 2019-12-21 NOTE — ED Notes (Signed)
Pt husband at bedside at this time. This RN visualized pt husband giving pt food from home at this time.   Pt denies further needs at this time.

## 2019-12-21 NOTE — ED Notes (Signed)
This RN spoke with EDP regarding pt's husband notifying this RN of patient's PCP requesting patient to have a head CT while in ED. Per EDP quale, will have to speak with patient prior to ordering. Pt's husband [name], updated.

## 2019-12-21 NOTE — ED Notes (Signed)
Pt states she "wet the bed" at this time. Discussed with charge RN Opal Sidles about moving pt to a room due to incontinence. Charge RN agreed to move pt to ED room 2 when clean. Discussed with pt, pt in agreement with waiting until in room to change brief and linens.

## 2019-12-21 NOTE — NC FL2 (Signed)
[place] LEVEL OF CARE SCREENING TOOL     IDENTIFICATION  Patient Name: [name] Birthdate: 1950/04/24 Sex: adult Admission Date (Current Location): 12/21/2019  [place] Number:  Engineering geologist and Address:  [facility], [address], [place], [place] [address]      Provider Number: [id number]  Attending Physician Name and Address:  No att. providers found  Relative Name and Phone Number:  Tarr,Jeffrey (Spouse) [phone]    Current Level of Care: SNF Recommended Level of Care: [place] Prior Approval Number:    Date Approved/Denied:   PASRR Number: [id number]  Discharge Plan: SNF    Current Diagnoses: Patient Active Problem List   Diagnosis Date Noted  . Chronic bacteriuria 11/17/2019  . Hyperkalemia 09/14/2019  . Bacteremia due to Gram-positive bacteria 09/08/2019  . Sepsis ([place]) 09/08/2019  . Toxic encephalopathy 09/08/2019  . Pressure injury of skin 09/04/2019  . Elevated troponin 09/02/2019  . COPD with acute exacerbation ([place]) 09/02/2019  . Acute respiratory failure with hypoxia ([place]) 09/02/2019  . Pharmacologic therapy 08/30/2019  . Recurrent falls 07/10/2019  . Left ankle pain 06/02/2019  . Chronic pain of right ankle 02/14/2019  . Arthritis 02/14/2019  . Nausea and vomiting 02/14/2019  . Meralgia paresthetica of right side 12/05/2018  . Chronic anticoagulation ([place]) 12/05/2018  . Peripheral vascular disease ([place]) 10/18/2018  . Chronic ulcer of right leg ([place]) 10/18/2018  . Fibromyalgia 08/17/2018  . Chronic musculoskeletal pain 08/17/2018  . Tremor of right hand 08/03/2018  . Overactive bladder 08/03/2018  . Physical deconditioning 08/03/2018  . Foot ulcer, left ([place]) 03/09/2018  . Adrenal mass ([place]) 08/02/2017  . Eczema 06/27/2017  . Diabetic ulcer of left foot ([place]) 06/24/2017  . Mass of both adrenal glands ([place]) 05/16/2017  . Fatty liver 05/13/2017  .  Strain of right hip 04/30/2017  . Ischial pain, right 04/30/2017  . Pain of left hip joint 04/22/2017  . Abnormality of gait and mobility 04/22/2017  . Muscle strain of left hip 04/09/2017  . Anemia 11/18/2016  . Asthma 11/18/2016  . Carpal tunnel syndrome 11/18/2016  . Pain in joint involving ankle and foot 11/18/2016  . Obstructive sleep apnea of adult 11/18/2016  . Spinal stenosis of thoracic region 11/18/2016  . Abscess of tendon of left foot 09/21/2016  . Cellulitis 09/05/2016  . Cubital tunnel syndrome on right 08/07/2016  . Chronic pain syndrome 02/25/2016  . Long term current use of opiate analgesic 02/25/2016  . Chronic right shoulder pain 02/25/2016  . CAP (community acquired pneumonia) 10/11/2015  . Status post reverse total shoulder replacement 10/08/2015  . 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 Morbid obesity with BMI of 40.0-44.9, adult ([place]) 09/23/2015  . Chronic prescription benzodiazepine use 09/23/2015  . Arthralgia 09/23/2015  . Osteoarthritis involving multiple joints 09/23/2015  . Other shoulder lesions (Right) 08/28/2015  . Cervical radiculitis 08/06/2015  . Fall 05/22/2015  . Opiate use (15 MME/Day) 01/16/2015  . Impingement syndrome of shoulder region 01/07/2015  . Mixed incontinence 11/26/2014  . Atrophic vaginitis 11/26/2014  . Anxiety and depression 09/21/2014  . Bladder cystocele 09/21/2014  . Gastro-esophageal reflux disease without esophagitis 09/21/2014  . DM (diabetes mellitus) type II, controlled, with peripheral vascular disorder ([employer]) 08/31/2014  . Diabetic peripheral neuropathy associated with type 2 diabetes mellitus ([place]) 08/31/2014  . Asthma, well controlled 08/31/2014  . Hypothyroidism, unspecified 08/31/2014  . Peripheral vascular disease due to secondary diabetes mellitus (  [place]) 12/11/2013  . Sleep apnea 10/19/2013  . Paroxysmal atrial fibrillation ([place]); CHA2DS2-Vasc Score 5. On Pradaxa 07/27/2013  .  Essential hypertension 07/27/2013  . Chronic atrial fibrillation 08/10/2012    Orientation RESPIRATION BLADDER Height & Weight     Self, Time, Situation, Place  Normal Incontinent Weight: 254 lb 6.6 oz (115.4 kg) Height:  5\' 5"  (165.1 cm)  BEHAVIORAL SYMPTOMS/MOOD NEUROLOGICAL BOWEL NUTRITION STATUS      Incontinent Diet  AMBULATORY STATUS COMMUNICATION OF NEEDS Skin   Extensive Assist Verbally Normal                       Personal Care Assistance Level of Assistance  Bathing, Dressing, Feeding Bathing Assistance: Limited assistance Feeding assistance: Independent Dressing Assistance: Limited assistance     Functional Limitations Info             SPECIAL CARE FACTORS FREQUENCY                       Contractures Contractures Info: Not present    Additional Factors Info                  Current Medications (12/21/2019):  This is the current hospital active medication list Current Facility-Administered Medications  Medication Dose Route Frequency Provider Last Rate Last Admin  . ALPRAZolam Duanne Moron) tablet 0.25 mg  0.25 mg Oral BID PRN Blake Divine, MD      . amLODipine (NORVASC) tablet 5 mg  5 mg Oral Daily Blake Divine, MD   5 mg at 12/21/19 0940  . buPROPion (WELLBUTRIN XL) 24 hr tablet 150 mg  150 mg Oral Daily Blake Divine, MD   150 mg at 12/21/19 1950  . cetirizine (ZYRTEC) tablet 5 mg  5 mg Oral QPM Blake Divine, MD      . dabigatran (PRADAXA) capsule 150 mg  150 mg Oral BID Blake Divine, MD   150 mg at 12/21/19 0949  . DULoxetine (CYMBALTA) DR capsule 60 mg  60 mg Oral Daily Blake Divine, MD   60 mg at 12/21/19 9326  . losartan (COZAAR) tablet 50 mg  50 mg Oral Daily Blake Divine, MD   50 mg at 12/21/19 7124   And  . hydrochlorothiazide (MICROZIDE) capsule 12.5 mg  12.5 mg Oral Daily Blake Divine, MD   12.5 mg at 12/21/19 0936  . HYDROcodone-acetaminophen (NORCO/VICODIN) 5-325 MG per tablet 1 tablet  1 tablet Oral Q8H PRN  Blake Divine, MD      . ipratropium-albuterol (DUONEB) 0.5-2.5 (3) MG/3ML nebulizer solution 3 mL  3 mL Nebulization Q6H [name], [name], MD      . levothyroxine (SYNTHROID) tablet 175 mcg  175 mcg Oral QAC breakfast Blake Divine, MD   175 mcg at 12/21/19 0935  . metFORMIN (GLUCOPHAGE) tablet 500-1,000 mg  500-1,000 mg Oral BID Blake Divine, MD   1,000 mg at 12/21/19 0948  . metoprolol succinate (TOPROL-XL) 24 hr tablet 50 mg  50 mg Oral Daily Blake Divine, MD   50 mg at 12/21/19 0939  . mometasone-formoterol (DULERA) 200-5 MCG/ACT inhaler 2 puff  2 puff Inhalation BID Blake Divine, MD      . montelukast (SINGULAIR) tablet 10 mg  10 mg Oral QHS Blake Divine, MD      . oxybutynin (DITROPAN-XL) 24 hr tablet 10 mg  10 mg Oral QHS Blake Divine, MD      . pantoprazole (PROTONIX) EC tablet 40 mg  40 mg  Oral Daily Blake Divine, MD   40 mg at 12/21/19 [id number]  . pregabalin (LYRICA) capsule 50 mg  50 mg Oral TID Blake Divine, MD   50 mg at 12/21/19 7209  . sucralfate (CARAFATE) 1 GM/10ML suspension 1 g  1 g Oral Once Delman Kitten, MD       Current Outpatient Medications  Medication Sig Dispense Refill  . acetaminophen (TYLENOL) 325 MG tablet Take 2 tablets (650 mg total) by mouth every 6 (six) hours as needed for mild pain or moderate pain (or Fever >/= 101).    Marland Kitchen albuterol (PROVENTIL) (2.5 MG/3ML) 0.083% nebulizer solution Take 2.5 mg by nebulization every 4 (four) hours as needed for wheezing or shortness of breath.    Marland Kitchen albuterol (VENTOLIN HFA) 108 (90 Base) MCG/ACT inhaler Inhale 2 puffs into the lungs every 6 (six) hours as needed for wheezing or shortness of breath.    . ALPRAZolam (XANAX) 0.5 MG tablet Take 0.5 tablets (0.25 mg total) by mouth 2 (two) times daily as needed for anxiety. 60 tablet 2  . amLODipine (NORVASC) 5 MG tablet Take 1 tablet (5 mg total) by mouth daily. 90 tablet 1  . buPROPion (WELLBUTRIN XL) 150 MG 24 hr tablet Take 1 tablet (150 mg total) by  mouth daily. (Patient taking differently: Take 300 mg by mouth daily. ) 90 tablet 3  . clobetasol cream (TEMOVATE) 4.70 % Apply 1 application topically 2 (two) times daily.    . colchicine 0.6 MG tablet Take 0.6 mg by mouth 2 (two) times daily.    . dabigatran (PRADAXA) 150 MG CAPS capsule Take 1 capsule (150 mg total) by mouth 2 (two) times daily. Please call to schedule office visit for further refills. Thank you! 60 capsule 0  . DULoxetine (CYMBALTA) 60 MG capsule Take 60 mg by mouth daily.    . ergocalciferol (VITAMIN D2) 1.25 MG (50000 UT) capsule Take 50,000 Units by mouth once a week.    . estradiol (ESTRACE) 0.1 MG/GM vaginal cream Place 1 Applicatorful vaginally every Monday, Wednesday, and Friday. Apply 0.5mg  (pea-sized amount)  just inside the vaginal introitus with a finger-tip on Monday, Wednesday and Friday nights, 42.5 g 11  . fluticasone-salmeterol (ADVAIR HFA) 115-21 MCG/ACT inhaler Inhale 2 puffs into the lungs daily. Rinse mouth thoroughly after use 1 Inhaler 12  . HYDROcodone-acetaminophen (NORCO/VICODIN) 5-325 MG tablet Take 1 tablet by mouth every 8 (eight) hours as needed for severe pain. Must last 30 days 90 tablet 0  . levocetirizine (XYZAL) 5 MG tablet Take 0.5 tablets (2.5 mg total) by mouth every evening. (Patient taking differently: Take 5 mg by mouth every evening. ) 45 tablet 3  . levothyroxine (SYNTHROID) 175 MCG tablet Take 1 tablet (175 mcg total) by mouth daily before breakfast. 90 tablet 3  . losartan-hydrochlorothiazide (HYZAAR) 50-12.5 MG tablet Take 1 tablet by mouth daily.    . metFORMIN (GLUCOPHAGE) 1000 MG tablet Take 1 tablet (1,000 mg total) by mouth 2 (two) times daily. With food (Patient taking differently: Take 500-1,000 mg by mouth 2 (two) times daily. Take 1 tablet every morning and 1/2 tablet every night.) 180 tablet 3  . metoprolol succinate (TOPROL-XL) 50 MG 24 hr tablet TAKE 1 TABLET BY MOUTH DAILY 90 tablet 1  . montelukast (SINGULAIR) 10 MG tablet  Take 1 tablet (10 mg total) by mouth at bedtime. 90 tablet 3  . ondansetron (ZOFRAN) 4 MG tablet Take 1 tablet (4 mg total) by mouth every 8 (eight) hours as  needed for nausea or vomiting. 90 tablet 1  . oxybutynin (DITROPAN XL) 10 MG 24 hr tablet Take 10 mg by mouth at bedtime.    . pantoprazole (PROTONIX) 40 MG tablet Take 40 mg by mouth daily.     . pregabalin (LYRICA) 50 MG capsule Take 1 capsule (50 mg total) by mouth 3 (three) times daily. 90 capsule 5  . sucralfate (CARAFATE) 1 GM/10ML suspension Take 1 g by mouth 3 (three) times daily.     . TOVIAZ 8 MG TB24 tablet TAKE ONE TABLET BY MOUTH EVERY DAY 90 tablet 3  . trospium (SANCTURA) 20 MG tablet Take 20 mg by mouth 2 (two) times daily.    Derrill Memo ON 12/25/2019] HYDROcodone-acetaminophen (NORCO/VICODIN) 5-325 MG tablet Take 1 tablet by mouth every 8 (eight) hours as needed for severe pain. Must last 30 days 90 tablet 0  . [START ON 01/24/2020] HYDROcodone-acetaminophen (NORCO/VICODIN) 5-325 MG tablet Take 1 tablet by mouth every 8 (eight) hours as needed for severe pain. Must last 30 days 90 tablet 0  . meloxicam (MOBIC) 15 MG tablet Take 15 mg by mouth daily. (Patient not taking: Reported on 12/21/2019)       Discharge Medications: Please see discharge summary for a list of discharge medications.  Relevant Imaging Results:  Relevant Lab Results:   Additional Information SS# [id number]  Adelene Amas, LCSWA

## 2019-12-21 NOTE — ED Provider Notes (Signed)
[facility] Emergency Department Provider Note   ____________________________________________   First MD Initiated Contact with Patient 12/21/19 [phone]     (approximate)  I have reviewed the triage vital signs and the nursing notes.   HISTORY  Chief Complaint Weakness    HPI [name] is a 69 y.o. adult with possible history of hypertension, diabetes, chronic pain, COPD, and chronic atrial fibrillation on Pradaxa who presents to the ED complaining of weakness.  History is limited due to patient's occasional confusion.  She reportedly has been increasingly weak at home over the past couple of days with multiple falls.  Patient states she has been doing fine and her husband is able to care for her, however he reported to EMS that he has been unable to do so.  Patient currently only complains of pain to her left forearm from where her husband has been pulling her.  She denies hitting her head or losing consciousness during recent falls.  She has not had any fevers, cough, chest pain, shortness of breath, dysuria, or hematuria.  She was seen in the ED yesterday for similar symptoms with unremarkable work-up, but has been called EMS again today so that patient could be placed in a rehab facility.        Past Medical History:  Diagnosis Date  . Abnormal antibody titer   . Anxiety and depression   . Arthritis   . Asthma   . Cystocele   . Depression   . Diabetes ([place])   . DVT (deep venous thrombosis) (HCC)    Righ calf  . Dyspnea    11/08/2018 - "more now due to lack of exercise"  . Dysrhythmia    afib  . Edema   . GERD (gastroesophageal reflux disease)   . Heart murmur    Echocardiogram June 2015: Mild MR (possible vegetation seen on TTE, not seen on TEE), normal LV size with moderate concentric LVH. Normal function EF 60-65%. Normal diastolic function. Mild LA dilation.  Marland Kitchen History of IBS   . History of kidney stones   . History of methicillin  resistant staphylococcus aureus (MRSA) 2008  . Hyperlipidemia   . Hypertension   . Hypothyroidism   . Insomnia   . Kidney stone    kidney stones with lithotripsy .  [facility]- "adrenal glands"  . Neuropathy involving both lower extremities   . Obesity, Class II, BMI 35-39.9, with comorbidity   . Paroxysmal atrial fibrillation ([place]) 2007   In June 2015, Cardiac Event Monitor: Mostly SR/sinus arrhythmia with PVCs that are frequent. Short bursts of A. fib lasting several minutes;; CHA2DS2-VASc Score = 5 (age, Female, PVD, DM, HTN)  . Peripheral vascular disease ([place])   . Sleep apnea    use C-PAP  . Urinary incontinence   . Venous stasis dermatitis of both lower extremities     Patient Active Problem List   Diagnosis Date Noted  . Chronic bacteriuria 11/17/2019  . Hyperkalemia 09/14/2019  . Bacteremia due to Gram-positive bacteria 09/08/2019  . Sepsis ([place]) 09/08/2019  . Toxic encephalopathy 09/08/2019  . Pressure injury of skin 09/04/2019  . Elevated troponin 09/02/2019  . COPD with acute exacerbation ([place]) 09/02/2019  . Acute respiratory failure with hypoxia ([place]) 09/02/2019  . Pharmacologic therapy 08/30/2019  . Recurrent falls 07/10/2019  . Left ankle pain 06/02/2019  . Chronic pain of right ankle 02/14/2019  . Arthritis 02/14/2019  . Nausea and vomiting 02/14/2019  . Meralgia paresthetica of right side  12/05/2018  . Chronic anticoagulation (Pradaxa) 12/05/2018  . Peripheral vascular disease ([place]) 10/18/2018  . Chronic ulcer of right leg ([place]) 10/18/2018  . Fibromyalgia 08/17/2018  . Chronic musculoskeletal pain 08/17/2018  . Tremor of right hand 08/03/2018  . Overactive bladder 08/03/2018  . Physical deconditioning 08/03/2018  . Foot ulcer, left ([place]) 03/09/2018  . Adrenal mass ([place]) 08/02/2017  . Eczema 06/27/2017  . Diabetic ulcer of left foot ([place]) 06/24/2017  . Mass of both adrenal glands ([place]) 05/16/2017  . Fatty liver 05/13/2017  . Strain of right hip  04/30/2017  . Ischial pain, right 04/30/2017  . Pain of left hip joint 04/22/2017  . Abnormality of gait and mobility 04/22/2017  . Muscle strain of left hip 04/09/2017  . Anemia 11/18/2016  . Asthma 11/18/2016  . Carpal tunnel syndrome 11/18/2016  . Pain in joint involving ankle and foot 11/18/2016  . Obstructive sleep apnea of adult 11/18/2016  . Spinal stenosis of thoracic region 11/18/2016  . Abscess of tendon of left foot 09/21/2016  . Cellulitis 09/05/2016  . Cubital tunnel syndrome on right 08/07/2016  . Chronic pain syndrome 02/25/2016  . Long term current use of opiate analgesic 02/25/2016  . Chronic right shoulder pain 02/25/2016  . CAP (community acquired pneumonia) 10/11/2015  . Status post reverse total shoulder replacement 10/08/2015  . 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 Morbid obesity with BMI of 40.0-44.9, adult ([place]) 09/23/2015  . Chronic prescription benzodiazepine use 09/23/2015  . Arthralgia 09/23/2015  . Osteoarthritis involving multiple joints 09/23/2015  . Other shoulder lesions (Right) 08/28/2015  . Cervical radiculitis 08/06/2015  . Fall 05/22/2015  . Opiate use (15 MME/Day) 01/16/2015  . Impingement syndrome of shoulder region 01/07/2015  . Mixed incontinence 11/26/2014  . Atrophic vaginitis 11/26/2014  . Anxiety and depression 09/21/2014  . Bladder cystocele 09/21/2014  . Gastro-esophageal reflux disease without esophagitis 09/21/2014  . DM (diabetes mellitus) type II, controlled, with peripheral vascular disorder ([place]) 08/31/2014  . Diabetic peripheral neuropathy associated with type 2 diabetes mellitus ([place]) 08/31/2014  . Asthma, well controlled 08/31/2014  . Hypothyroidism, unspecified 08/31/2014  . Peripheral vascular disease due to secondary diabetes mellitus ([place]) 12/11/2013  . Sleep apnea 10/19/2013  . Paroxysmal atrial fibrillation ([place]); CHA2DS2-Vasc Score 5. On Pradaxa 07/27/2013  . Essential hypertension  07/27/2013  . Chronic atrial fibrillation 08/10/2012    Past Surgical History:  Procedure Laterality Date  . ANKLE SURGERY Right   . APPLICATION OF WOUND VAC Left 06/27/2015   Procedure: APPLICATION OF WOUND VAC ( POSSIBLE ) ;  Surgeon: [name], MD;  Location: [facility] ORS;  Service: Vascular;  Laterality: Left;  . APPLICATION OF WOUND VAC Left 09/25/2016   Procedure: APPLICATION OF WOUND VAC;  Surgeon: [name], DPM;  Location: [facility] ORS;  Service: Podiatry;  Laterality: Left;  . arm surgery     right    . CHOLECYSTECTOMY    . COLONOSCOPY    . COLONOSCOPY WITH PROPOFOL N/A 01/11/2017   Procedure: COLONOSCOPY WITH PROPOFOL;  Surgeon: [name], MD;  Location: [facility] ENDOSCOPY;  Service: Endoscopy;  Laterality: N/A;  . COLONOSCOPY WITH PROPOFOL N/A 02/22/2017   Procedure: COLONOSCOPY WITH PROPOFOL;  Surgeon: [name], MD;  Location: [facility] ENDOSCOPY;  Service: Endoscopy;  Laterality: N/A;  . COLONOSCOPY WITH PROPOFOL N/A 05/31/2017   Procedure: COLONOSCOPY WITH PROPOFOL;  Surgeon: [name], MD;  Location: [facility] ENDOSCOPY;  Service: Endoscopy;  Laterality: N/A;  . ESOPHAGOGASTRODUODENOSCOPY (EGD) WITH  PROPOFOL N/A 01/11/2017   Procedure: ESOPHAGOGASTRODUODENOSCOPY (EGD) WITH PROPOFOL;  Surgeon: [name], MD;  Location: [facility] ENDOSCOPY;  Service: Endoscopy;  Laterality: N/A;  . ESOPHAGOGASTRODUODENOSCOPY (EGD) WITH PROPOFOL N/A 10/25/2018   Procedure: ESOPHAGOGASTRODUODENOSCOPY (EGD) WITH PROPOFOL;  Surgeon: [name], MD;  [facility] END[facility];  Service: Endoscopy;  Laterality: N/A;  . ESOPHAGOGASTRODUODENOSCOPY (EGD) WITH PROPOFOL N/A 11/29/2019   Procedure: ESOPHAGOGASTRODUODENOSCOPY (EGD) WITH PROPOFOL;  Surgeon: Toledo, Benay Pike, MD;  Location: [facility] ENDOSCOPY;  Service: Gastroenterology;  Laterality: N/A;  . HERNIA REPAIR     umbilical  . HIATAL HERNIA REPAIR    . I & D EXTREMITY Left 06/27/2015   Procedure: IRRIGATION AND DEBRIDEMENT  EXTREMITY            ( CALF HEMATOMA ) POSSIBLE WOUND VAC;  Surgeon: [name], MD;  Location: [facility] ORS;  Service: Vascular;  Laterality: Left;  . INCISION AND DRAINAGE OF WOUND Left 09/25/2016   Procedure: IRRIGATION AND DEBRIDEMENT - PARTIAL RESECTION OF ACHILLES TENDON WITH WOUND VAC APPLICATION;  Surgeon: [name], DPM;  Location: [facility] ORS;  Service: Podiatry;  Laterality: Left;  . INCISION AND DRAINAGE OF WOUND Left 11/09/2018   Procedure: Excision of left foot wound with ACell placement;  Surgeon: Wallace Going, DO;  Location: [place];  Service: Plastics;  Laterality: Left;  . IRRIGATION AND DEBRIDEMENT ABSCESS Left 09/07/2016   Procedure: IRRIGATION AND DEBRIDEMENT ABSCESS LEFT HEEL;  Surgeon: [name], DPM;  Location: [facility] ORS;  Service: Podiatry;  Laterality: Left;  . JOINT REPLACEMENT  2013   left knee replacement  . KIDNEY SURGERY Right    kidney stones  . LITHOTRIPSY    . LOWER EXTREMITY ANGIOGRAPHY Left 04/04/2018   Procedure: LOWER EXTREMITY ANGIOGRAPHY;  Surgeon: [name], MD;  Location: [place] CV LAB;  Service: Cardiovascular;  Laterality: Left;  . NM GATED MYOVIEW ([place] [facility])  February 2017   Likely breast attenuation. LOW RISK study. Normal EF 55-60%.  . OTHER SURGICAL HISTORY     08/2016 or 09/2016 surgery on achilles tenso h/o staph infection heal. Dr. [name]  . removal of left hematoma Left    leg  . REVERSE SHOULDER ARTHROPLASTY Right 10/08/2015   Procedure: REVERSE SHOULDER ARTHROPLASTY;  Surgeon: [name], MD;  Location: [facility] ORS;  Service: Orthopedics;  Laterality: Right;  . SLEEVE GASTROPLASTY    . TONSILLECTOMY    . TOTAL KNEE ARTHROPLASTY Left   . TOTAL SHOULDER REPLACEMENT Right   . TRANSESOPHAGEAL ECHOCARDIOGRAM  08/08/2013   Mild LVH, EF 60-65%. Moderate LA dilation and mild RA dilation. Mild MR with no evidence of stenosis and no evidence of endocarditis. A false chordae is noted.  . TRANSTHORACIC ECHOCARDIOGRAM  08/03/2013    Mild-Moderate concentric LVH, EF 60-65%. Normal diastolic function. Mild LA dilation. Mild MR with possible vegetation  - not confirmed on TEE   . ULNAR NERVE TRANSPOSITION Right 08/06/2016   Procedure: ULNAR NERVE DECOMPRESSION/TRANSPOSITION;  Surgeon: [name], MD;  Location: [facility] ORS;  Service: Orthopedics;  Laterality: Right;  . UPPER GI ENDOSCOPY    . VAGINAL HYSTERECTOMY      Prior to Admission medications   Medication Sig Start Date End Date Taking? Authorizing Provider  ALPRAZolam Duanne Moron) 0.5 MG tablet Take 0.5 tablets (0.25 mg total) by mouth 2 (two) times daily as needed for anxiety. 10/27/19  Yes [name], [name], MD  amLODipine (NORVASC) 5 MG tablet Take 1 tablet (5 mg total) by mouth daily. 11/28/19  02/26/20 Yes Loel Dubonnet, NP  buPROPion (WELLBUTRIN XL) 150 MG 24 hr tablet Take 1 tablet (150 mg total) by mouth daily. 05/02/19  Yes [name], [name], MD  fluticasone-salmeterol (ADVAIR HFA) 115-21 MCG/ACT inhaler Inhale 2 puffs into the lungs daily. Rinse mouth thoroughly after use 08/16/19  Yes Tyler Pita, MD  HYDROcodone-acetaminophen (NORCO/VICODIN) 5-325 MG tablet Take 1 tablet by mouth every 8 (eight) hours as needed for severe pain. Must last 30 days 11/25/19 12/25/19 Yes Gillis Santa, MD  levocetirizine (XYZAL) 5 MG tablet Take 0.5 tablets (2.5 mg total) by mouth every evening. 09/07/19  Yes [name], [name], MD  levothyroxine (SYNTHROID) 175 MCG tablet Take 1 tablet (175 mcg total) by mouth daily before breakfast. 03/10/19  Yes [name], [name], MD  losartan-hydrochlorothiazide (HYZAAR) 50-12.5 MG tablet Take 1 tablet by mouth daily.   Yes [provider]  meloxicam (MOBIC) 15 MG tablet Take 15 mg by mouth daily.   Yes [provider]  metFORMIN (GLUCOPHAGE) 1000 MG tablet Take 1 tablet (1,000 mg total) by mouth 2 (two) times daily. With food 12/14/18  Yes [name], [name], MD  metoprolol succinate (TOPROL-XL) 50 MG  24 hr tablet TAKE 1 TABLET BY MOUTH DAILY 11/09/19  Yes Loel Dubonnet, NP  montelukast (SINGULAIR) 10 MG tablet Take 1 tablet (10 mg total) by mouth at bedtime. 02/03/19  Yes [name], [name], MD  ondansetron (ZOFRAN) 4 MG tablet Take 1 tablet (4 mg total) by mouth every 8 (eight) hours as needed for nausea or vomiting. 02/14/19  Yes [name], [name], MD  pantoprazole (PROTONIX) 40 MG tablet Take by mouth. 04/07/19 04/06/20 Yes [provider]  pregabalin (LYRICA) 50 MG capsule Take 1 capsule (50 mg total) by mouth 3 (three) times daily. 11/22/19 05/20/20 Yes Gillis Santa, MD  sucralfate (CARAFATE) 1 GM/10ML suspension Take 1 g by mouth 3 (three) times daily. 12/06/19  Yes [provider]  TOVIAZ 8 MG TB24 tablet TAKE ONE TABLET BY MOUTH EVERY DAY 08/07/19  Yes McGowan, [name] A, PA-C  acetaminophen (TYLENOL) 325 MG tablet Take 2 tablets (650 mg total) by mouth every 6 (six) hours as needed for mild pain or moderate pain (or Fever >/= 101). 09/08/19   [place] Shi, MD  dabigatran (PRADAXA) 150 MG CAPS capsule Take 1 capsule (150 mg total) by mouth 2 (two) times daily. Please call to schedule office visit for further refills. Thank you! 10/05/19   Wellington Hampshire, MD  DULoxetine HCl 40 MG CPEP Take 40 mg by mouth daily. 11/22/19 12/22/19  Gillis Santa, MD  estradiol (ESTRACE) 0.1 MG/GM vaginal cream Place 1 Applicatorful vaginally every Monday, Wednesday, and Friday. Apply 0.5mg  (pea-sized amount)  just inside the vaginal introitus with a finger-tip on Monday, Wednesday and Friday nights, 02/01/19   McGowan, [name] A, PA-C  HYDROcodone-acetaminophen (NORCO/VICODIN) 5-325 MG tablet Take 1 tablet by mouth every 8 (eight) hours as needed for severe pain. Must last 30 days 12/25/19 01/24/20  Gillis Santa, MD  HYDROcodone-acetaminophen (NORCO/VICODIN) 5-325 MG tablet Take 1 tablet by mouth every 8 (eight) hours as needed for severe pain. Must last 30 days 01/24/20 02/23/20   Gillis Santa, MD    Allergies Morphine and related, Penicillins, Ambien [zolpidem], Aspirin, and Oxytetracycline  Family History  Problem Relation Age of Onset  . Skin cancer Father   . Diabetes Father   . Hypertension Father   . Peripheral vascular disease Father   . Cancer Father   .  Cerebral aneurysm Father   . Alcohol abuse Father   . Varicose Veins Mother   . Kidney disease Mother   . Arthritis Mother   . Mental illness Sister   . Cancer Maternal Aunt        breast  . Breast cancer Maternal Aunt   . Arthritis Maternal Grandmother   . Hypertension Maternal Grandmother   . Diabetes Maternal Grandmother   . Arthritis Maternal Grandfather   . Heart disease Maternal Grandfather   . Hypertension Maternal Grandfather   . Arthritis Paternal Grandmother   . Hypertension Paternal Grandmother   . Diabetes Paternal Grandmother   . Arthritis Paternal Grandfather   . Hypertension Paternal Grandfather   . Bladder Cancer Neg Hx   . Kidney cancer Neg Hx     Social History Social History   Tobacco Use  . Smoking status: Never Smoker  . Smokeless tobacco: Never Used  Vaping Use  . Vaping Use: Never used  Substance Use Topics  . Alcohol use: No    Alcohol/week: 0.0 standard drinks  . Drug use: Never    Review of Systems  Constitutional: No fever/chills Eyes: No visual changes. ENT: No sore throat. Cardiovascular: Denies chest pain. Respiratory: Denies shortness of breath. Gastrointestinal: No abdominal pain.  No nausea, no vomiting.  No diarrhea.  No constipation. Genitourinary: Negative for dysuria. Musculoskeletal: Negative for back pain.  Positive for arm pain. Skin: Negative for rash. Neurological: Negative for headaches, focal weakness or numbness.  ____________________________________________   PHYSICAL EXAM:  VITAL SIGNS: ED Triage Vitals  Enc Vitals Group     BP 12/20/19 1420 111/64     Pulse Rate 12/20/19 1420 65     Resp 12/20/19 1420 17     Temp  12/20/19 1420 97.9 F (36.6 C)     Temp Source 12/20/19 1420 Oral     SpO2 12/20/19 1420 99 %     Weight 12/20/19 1422 254 lb 6.6 oz (115.4 kg)     Height 12/20/19 1422 5\' 5"  (1.651 m)     Head Circumference --      Peak Flow --      Pain Score 12/20/19 1421 6     Pain Loc --      Pain Edu? --      Excl. in [place]? --     Constitutional: Alert and oriented to person and place, but not time. Eyes: Conjunctivae are normal. Head: Atraumatic. Nose: No congestion/rhinnorhea. Mouth/Throat: Mucous membranes are moist. Neck: Normal ROM Cardiovascular: Normal rate, regular rhythm. Grossly normal heart sounds. Respiratory: Normal respiratory effort.  No retractions. Lungs CTAB. Gastrointestinal: Soft and nontender. No distention. Genitourinary: deferred Musculoskeletal: No lower extremity tenderness nor edema.  Superficial bruising to left forearm with no focal bony tenderness.  Range of motion intact to bilateral upper extremities without discomfort. Neurologic:  Normal speech and language. No gross focal neurologic deficits are appreciated. Skin:  Skin is warm, dry and intact. No rash noted. Psychiatric: Mood and affect are normal. Speech and behavior are normal.  ____________________________________________   LABS (all labs ordered are listed, but only abnormal results are displayed)  Labs Reviewed - No data to display   PROCEDURES  Procedure(s) performed (including Critical Care):  Procedures   ____________________________________________   INITIAL IMPRESSION / ASSESSMENT AND PLAN / ED COURSE       69 year old female with past medical history of hypertension, diabetes, chronic pain, COPD, and chronic A. fib on Pradaxa who presents to the ED with  increasing weakness and multiple falls over the past week.  EMS reports there was concern from husband that he has been unable to care for her at home.  There is also a telephone encounter with her PCP where husband stated that she  needed to be placed at nursing facility.  Unfortunately, I am currently unable to reach the husband.  Work-up from yesterday's ED visit was reviewed and unremarkable, do not feel repeat labs are warranted at this time.  Imaging performed of left forearm and negative for acute process, no evidence of other acute traumatic injury on exam.  We will have patient evaluated by PT and social work for potential placement.      ____________________________________________   FINAL CLINICAL IMPRESSION(S) / ED DIAGNOSES  Final diagnoses:  Generalized weakness     ED Discharge Orders    None       Note:  This document was prepared using Dragon voice recognition software and may include unintentional dictation errors.   Blake Divine, MD 12/21/19 [phone]

## 2019-12-21 NOTE — ED Notes (Addendum)
PT at bedside at this time. EDP consulted regarding Sulcrafate PO per patient request.

## 2019-12-21 NOTE — ED Notes (Signed)
This RN to bedside, pt resting in bed with husband at bedside. Pt states has not had a meal tray delivered. This RN called dining services requesting lunch tray at this time.

## 2019-12-21 NOTE — Evaluation (Signed)
Physical Therapy Evaluation Patient Details Name: [name] MRN: [id number] DOB: Aug 11, 1950 Today's Date: 12/21/2019   History of Present Illness  [name] is a 69 y.o. adult with possible history of hypertension, diabetes, chronic pain, COPD, and chronic atrial fibrillation on Pradaxa who presents to the ED complaining of weakness.  History is limited due to patient's occasional confusion.  She reportedly has been increasingly weak at home over the past couple of days with multiple falls.  Patient states she has been doing fine and her husband is able to care for her, however he reported to EMS that he has been unable to do so.  Patient currently only complains of pain to her left forearm from where her husband has been pulling her.  Clinical Impression  Patient received in bed. Agrees to PT assessment, but does not want to get oob. Agrees to sit up on side of bed. Required mod assist for supine to sit for raising trunk and to assist with scooting hips to edge of bed. Once assisted to edge of bed she is able to sit unsupported. She is unable/unwilling to attempt standing at this time. She required +2 max assist with sit to supine and for positioning in bed. She will continue to benefit from skilled PT while here to improve functional independence and strength. One of her main limiting factors with mobility is her inability to use her left arm at all. Discussed with MD getting her and MRI of left arm as it appears she may have a rotator cuff tear.        Follow Up Recommendations SNF;Supervision/Assistance - 24 hour    Equipment Recommendations  None recommended by PT    Recommendations for Other Services OT consult     Precautions / Restrictions Precautions Precautions: Fall Restrictions Weight Bearing Restrictions: No      Mobility  Bed Mobility Overal bed mobility: Needs Assistance Bed Mobility: Sit to Supine;Supine to Sit     Supine to sit: Mod assist;HOB  elevated Sit to supine: Max assist;+2 for physical assistance   General bed mobility comments: due to patient's inability to use L UE she requires extensive assist for basic mobility  Transfers                 General transfer comment: Unable to attempt this session.  Ambulation/Gait             General Gait Details: unable  Stairs            Wheelchair Mobility    Modified Rankin (Stroke Patients Only)       Balance Overall balance assessment: Modified Independent                                           Pertinent Vitals/Pain Pain Assessment: Faces Faces Pain Scale: Hurts whole lot Pain Location: L arm Pain Descriptors / Indicators: Aching;Grimacing;Guarding;Discomfort;Sore Pain Intervention(s): Monitored during session;Limited activity within patient's tolerance    Home Living Family/patient expects to be discharged to:: Skilled nursing facility Living Arrangements: Spouse/significant other Available Help at Discharge: Family;Available 24 hours/day Type of Home: House Home Access: Ramped entrance     Home Layout: One level Home Equipment: Walker - 2 wheels;Walker - 4 wheels;Bedside commode Additional Comments: Reports she has 4 steps in front, but has ramp in garage.    Prior Function Level of Independence: Needs assistance  Gait / Transfers Assistance Needed: Patient reports she was able to walk with walker.  ADL's / Homemaking Assistance Needed: Husband assists, Physiological scientist Dominance   Dominant Hand: Right    Extremity/Trunk Assessment   Upper Extremity Assessment Upper Extremity Assessment: LUE deficits/detail LUE: Unable to fully assess due to pain LUE Sensation: WNL LUE Coordination: decreased gross motor;decreased fine motor    Lower Extremity Assessment Lower Extremity Assessment: Generalized weakness    Cervical / Trunk Assessment Cervical / Trunk Assessment: Normal  Communication    Communication: No difficulties  Cognition Arousal/Alertness: Awake/alert Behavior During Therapy: WFL for tasks assessed/performed Overall Cognitive Status: Within Functional Limits for tasks assessed Area of Impairment: Awareness;Safety/judgement                         Safety/Judgement: Decreased awareness of safety;Decreased awareness of deficits     General Comments: patient reports she wants to go home which is unrealistic at this time.  She has decreased awareness of her limitations.      General Comments General comments (skin integrity, edema, etc.): Patient is able to sit unassisted at edge of bed    Exercises     Assessment/Plan    PT Assessment Patient needs continued PT services  PT Problem List Decreased strength;Decreased mobility;Decreased activity tolerance;Decreased range of motion;Pain;Obesity;Decreased safety awareness       PT Treatment Interventions Therapeutic activities;Gait training;Therapeutic exercise;Patient/family education;Functional mobility training    PT Goals (Current goals can be found in the Care Plan section)  Acute Rehab PT Goals Patient Stated Goal: to go home PT Goal Formulation: With patient Time For Goal Achievement: 01/04/20 Potential to Achieve Goals: Fair    Frequency Min 2X/week   Barriers to discharge Inaccessible home environment;Decreased caregiver support patient's husband is unable to help her at home    Co-evaluation               AM-PAC PT "6 Clicks" Mobility  Outcome Measure Help needed turning from your back to your side while in a flat bed without using bedrails?: A Lot Help needed moving from lying on your back to sitting on the side of a flat bed without using bedrails?: A Lot Help needed moving to and from a bed to a chair (including a wheelchair)?: Total Help needed standing up from a chair using your arms (e.g., wheelchair or bedside chair)?: Total Help needed to walk in hospital room?:  Total Help needed climbing 3-5 steps with a railing? : Total 6 Click Score: 8    End of Session   Activity Tolerance: Patient limited by pain Patient left: in bed;with call bell/phone within reach Nurse Communication: Mobility status PT Visit Diagnosis: Muscle weakness (generalized) (M62.81);Pain;Other abnormalities of gait and mobility (R26.89);History of falling (Z91.81) Pain - Right/Left: Left Pain - part of body: Arm;Ankle and joints of foot    Time: 1224-8250 PT Time Calculation (min) (ACUTE ONLY): 33 min   Charges:   PT Evaluation $PT Eval Moderate Complexity: 1 Mod PT Treatments $Therapeutic Activity: 8-22 mins        Blaike Vickers, PT, GCS 12/21/19,11:21 AM

## 2019-12-21 NOTE — ED Notes (Signed)
Pt husband updated on pt condition and plan of care by this RN at this time.

## 2019-12-22 ENCOUNTER — Emergency Department: Payer: PPO

## 2019-12-22 DIAGNOSIS — I10 Essential (primary) hypertension: Secondary | ICD-10-CM | POA: Diagnosis not present

## 2019-12-22 DIAGNOSIS — G8194 Hemiplegia, unspecified affecting left nondominant side: Secondary | ICD-10-CM | POA: Diagnosis not present

## 2019-12-22 DIAGNOSIS — R531 Weakness: Secondary | ICD-10-CM | POA: Diagnosis not present

## 2019-12-22 DIAGNOSIS — Z96611 Presence of right artificial shoulder joint: Secondary | ICD-10-CM | POA: Diagnosis present

## 2019-12-22 DIAGNOSIS — R4702 Dysphasia: Secondary | ICD-10-CM | POA: Diagnosis present

## 2019-12-22 DIAGNOSIS — I6340 Cerebral infarction due to embolism of unspecified cerebral artery: Secondary | ICD-10-CM | POA: Diagnosis present

## 2019-12-22 DIAGNOSIS — M109 Gout, unspecified: Secondary | ICD-10-CM | POA: Diagnosis present

## 2019-12-22 DIAGNOSIS — Z7984 Long term (current) use of oral hypoglycemic drugs: Secondary | ICD-10-CM | POA: Diagnosis not present

## 2019-12-22 DIAGNOSIS — F419 Anxiety disorder, unspecified: Secondary | ICD-10-CM

## 2019-12-22 DIAGNOSIS — Z86718 Personal history of other venous thrombosis and embolism: Secondary | ICD-10-CM | POA: Diagnosis not present

## 2019-12-22 DIAGNOSIS — I639 Cerebral infarction, unspecified: Secondary | ICD-10-CM | POA: Diagnosis present

## 2019-12-22 DIAGNOSIS — E1142 Type 2 diabetes mellitus with diabetic polyneuropathy: Secondary | ICD-10-CM | POA: Diagnosis present

## 2019-12-22 DIAGNOSIS — F32A Depression, unspecified: Secondary | ICD-10-CM | POA: Diagnosis present

## 2019-12-22 DIAGNOSIS — I480 Paroxysmal atrial fibrillation: Secondary | ICD-10-CM | POA: Diagnosis not present

## 2019-12-22 DIAGNOSIS — Z79899 Other long term (current) drug therapy: Secondary | ICD-10-CM | POA: Diagnosis not present

## 2019-12-22 DIAGNOSIS — I482 Chronic atrial fibrillation, unspecified: Secondary | ICD-10-CM | POA: Diagnosis present

## 2019-12-22 DIAGNOSIS — Z96652 Presence of left artificial knee joint: Secondary | ICD-10-CM | POA: Diagnosis present

## 2019-12-22 DIAGNOSIS — Z7901 Long term (current) use of anticoagulants: Secondary | ICD-10-CM | POA: Diagnosis not present

## 2019-12-22 DIAGNOSIS — E039 Hypothyroidism, unspecified: Secondary | ICD-10-CM | POA: Diagnosis present

## 2019-12-22 DIAGNOSIS — Z79890 Hormone replacement therapy (postmenopausal): Secondary | ICD-10-CM | POA: Diagnosis not present

## 2019-12-22 DIAGNOSIS — Z6841 Body Mass Index (BMI) 40.0 and over, adult: Secondary | ICD-10-CM | POA: Diagnosis not present

## 2019-12-22 DIAGNOSIS — E785 Hyperlipidemia, unspecified: Secondary | ICD-10-CM | POA: Diagnosis present

## 2019-12-22 DIAGNOSIS — R29707 NIHSS score 7: Secondary | ICD-10-CM | POA: Diagnosis present

## 2019-12-22 DIAGNOSIS — Z20822 Contact with and (suspected) exposure to covid-19: Secondary | ICD-10-CM | POA: Diagnosis present

## 2019-12-22 DIAGNOSIS — K219 Gastro-esophageal reflux disease without esophagitis: Secondary | ICD-10-CM | POA: Diagnosis present

## 2019-12-22 DIAGNOSIS — E1151 Type 2 diabetes mellitus with diabetic peripheral angiopathy without gangrene: Secondary | ICD-10-CM | POA: Diagnosis present

## 2019-12-22 DIAGNOSIS — Z90710 Acquired absence of both cervix and uterus: Secondary | ICD-10-CM | POA: Diagnosis not present

## 2019-12-22 LAB — CREATININE, SERUM
Creatinine, Ser: 0.93 mg/dL (ref 0.44–1.00)
GFR calc Af Amer: >60 mL/min (ref >60)
GFR calc non Af Amer: >60 mL/min (ref >60)

## 2019-12-22 NOTE — ED Notes (Signed)
This RN to bedside at this time. Introduced self to pt. Pt visualized in NAD. Pt denies further needs at this time

## 2019-12-22 NOTE — ED Notes (Signed)
Pt cleaned with assistance from ED tech. New chux, purewick, and brief applied.

## 2019-12-22 NOTE — ED Notes (Signed)
This RN and Sam RN cleaned pt chucks and placed clean brief at this time. New purewick also placed at this time. Pt moved up in bed and repositioned for comfort at this time. Warm blanket given at this time.

## 2019-12-22 NOTE — ED Notes (Signed)
Pt in MRI at this time 

## 2019-12-22 NOTE — Progress Notes (Signed)
CH encountered pt.'s husband [name] in ED hallway; husband requested [facility] pray for ED staff; shared his wife has been in and out of hospital recently --> currently pt. is making decisions re: rehab facilities.  Husband expressed his perceived need for God's strength as he cares for his wife and deals with the uncertainties of the present COVID pandemic.  Husband grateful for time talking w/CH and is aware of chaplains' availability if needed.

## 2019-12-22 NOTE — ED Provider Notes (Signed)
Procedures     ----------------------------------------- 11:24 PM on 12/22/2019 -----------------------------------------  Symptoms discussed with the patient's spouse earlier today who notes that the weakness and confusion are very unusual for the patient.  MRI was obtained which does show a couple small strokes suggestive of embolic phenomenon in the setting of A. fib on Pradaxa.  Will need to hospitalize for stroke work-up and reconsideration of anticoagulation strategy given apparent Pradaxa failure.    Carrie Mew, MD 12/22/19 [id number]

## 2019-12-22 NOTE — ED Notes (Addendum)
Pt's husband repeatedly comes in and out of room interrupting EDP, EVS, and chaplain in hallway despite education on department policies. Visitor reminded to remain at bedside and use call-bell. Husband states "I am not wandering around."

## 2019-12-22 NOTE — TOC Progression Note (Signed)
Transition of Care [facility]) - Progression Note    Patient Details  Name: [name] MRN: [id number] Date of Birth: 1950-10-15  Transition of Care [facility]) CM/SW [place], [place] Phone Number:  [phone] 12/22/2019, 5:59 PM  Clinical Narrative:     Patient and spouse [name], discussed the SNF placement process and timeline.  CSW explained there are two SNFs that have offered placement for the patient.  The patient doe snot want to go to a SNF but her spouse spoke with her about his inability to care for her well, if she is at home.  CSW asked patient if she wanted this CSW to begin process for placement at [facility] and the patient stated to go ahead.    Patient's spouse will  Expected Discharge Plan: Skilled Nursing Facility Barriers to Discharge: Continued Medical Work up  Expected Discharge Plan and Services Expected Discharge Plan: [place] In-house Referral: Clinical Social Work   Post Acute Care Choice: [place] arrangements for the past 2 months: [place] Determinants of Health (SDOH) Interventions    Readmission Risk Interventions Readmission Risk Prevention Plan 07/30/2018  Transportation Screening Complete  PCP or Specialist Appt within 3-5 Days Complete  HRI or Home Care Consult Complete  Social Work Consult for [place] Planning/Counseling Patient refused  Palliative Care Screening Patient Refused  Medication Review Press photographer) Complete  Some recent data might be hidden

## 2019-12-22 NOTE — ED Notes (Signed)
Pharmacy called and will send up missing medications

## 2019-12-22 NOTE — ED Provider Notes (Signed)
-----------------------------------------   6:41 AM on 12/22/2019 -----------------------------------------   Blood pressure 133/69, pulse 70, temperature 98.8 F (37.1 C), temperature source Oral, resp. rate 16, height 5\' 5"  (1.651 m), weight 115.4 kg, SpO2 97 %.  The patient is resting at this time.  There have been no acute events since the last update.  Awaiting disposition plan from Social Work team.   Paulette Blanch, MD 12/22/19 [phone]

## 2019-12-23 ENCOUNTER — Inpatient Hospital Stay: Payer: PPO

## 2019-12-23 DIAGNOSIS — I639 Cerebral infarction, unspecified: Secondary | ICD-10-CM

## 2019-12-23 LAB — CBC
HCT: 30.6 % — ABNORMAL LOW (ref 36.0–46.0)
Hemoglobin: 10.3 g/dL — ABNORMAL LOW (ref 12.0–15.0)
MCH: 26.3 pg (ref 26.0–34.0)
MCHC: 33.7 g/dL (ref 30.0–36.0)
MCV: 78.3 fL — ABNORMAL LOW (ref 80.0–100.0)
Platelets: 308 10*3/uL (ref 150–400)
RBC: 3.91 MIL/uL (ref 3.87–5.11)
RDW: 15.9 % — ABNORMAL HIGH (ref 11.5–15.5)
WBC: 9.2 10*3/uL (ref 4.0–10.5)
nRBC: 0 % (ref 0.0–0.2)

## 2019-12-23 LAB — BASIC METABOLIC PANEL
Anion gap: 11 (ref 5–15)
BUN: 16 mg/dL (ref 8–23)
CO2: 25 mmol/L (ref 22–32)
Calcium: 8.3 mg/dL — ABNORMAL LOW (ref 8.9–10.3)
Chloride: 99 mmol/L (ref 98–111)
Creatinine, Ser: 0.92 mg/dL (ref 0.44–1.00)
GFR calc Af Amer: >60 mL/min (ref >60)
GFR calc non Af Amer: >60 mL/min (ref >60)
Glucose, Bld: 144 mg/dL — ABNORMAL HIGH (ref 70–99)
Potassium: 3.6 mmol/L (ref 3.5–5.1)
Sodium: 135 mmol/L (ref 135–145)

## 2019-12-23 LAB — RESPIRATORY PANEL BY RT PCR (FLU A&B, COVID)
Influenza A by PCR: NEGATIVE
Influenza B by PCR: NEGATIVE
SARS Coronavirus 2 by RT PCR: NEGATIVE

## 2019-12-23 LAB — LIPID PANEL
Cholesterol: 122 mg/dL (ref 0–200)
HDL: 63 mg/dL (ref >40)
LDL Cholesterol: 44 mg/dL (ref 0–99)
Total CHOL/HDL Ratio: 1.9 RATIO
Triglycerides: 77 mg/dL (ref <150)
VLDL: 15 mg/dL (ref 0–40)

## 2019-12-23 LAB — HEMOGLOBIN A1C
Hgb A1c MFr Bld: 6.9 % — ABNORMAL HIGH (ref 4.8–5.6)
Mean Plasma Glucose: 151.33 mg/dL

## 2019-12-23 LAB — PROTIME-INR
INR: 1.3 — ABNORMAL HIGH (ref 0.8–1.2)
Prothrombin Time: 16.1 seconds — ABNORMAL HIGH (ref 11.4–15.2)

## 2019-12-23 MED ORDER — ALPRAZOLAM 0.25 MG PO TABS: 0.2500 mg | ORAL_TABLET | Freq: Two times a day (BID) | ORAL | Status: DC | PRN

## 2019-12-23 MED ORDER — ONDANSETRON HCL 4 MG PO TABS
4.0000 mg | ORAL_TABLET | Freq: Three times a day (TID) | ORAL | Status: DC | PRN
  Filled 2019-12-23: qty 1

## 2019-12-23 MED ORDER — SODIUM CHLORIDE 0.9 % IV SOLN: INTRAVENOUS | Status: DC

## 2019-12-23 MED ORDER — ENOXAPARIN SODIUM 40 MG/0.4ML ~~LOC~~ SOLN
40.0000 mg | SUBCUTANEOUS | Status: DC
  Administered 2019-12-23: 40 mg via SUBCUTANEOUS
  Filled 2019-12-23: qty 0.4

## 2019-12-23 MED ORDER — ONDANSETRON HCL 4 MG/2ML IJ SOLN
4.0000 mg | Freq: Four times a day (QID) | INTRAMUSCULAR | Status: DC | PRN
  Administered 2019-12-24: 4 mg via INTRAVENOUS
  Filled 2019-12-23: qty 2

## 2019-12-23 MED ORDER — COLCHICINE 0.6 MG PO TABS
0.6000 mg | ORAL_TABLET | Freq: Two times a day (BID) | ORAL | Status: DC | PRN
  Filled 2019-12-23: qty 1

## 2019-12-23 MED ORDER — ALBUTEROL SULFATE (2.5 MG/3ML) 0.083% IN NEBU: 2.5000 mg | INHALATION_SOLUTION | RESPIRATORY_TRACT | Status: DC | PRN

## 2019-12-23 MED ORDER — ESTRADIOL 0.1 MG/GM VA CREA: 1.0000 | TOPICAL_CREAM | VAGINAL | Status: DC

## 2019-12-23 MED ORDER — SUCRALFATE 1 GM/10ML PO SUSP
1.0000 g | Freq: Three times a day (TID) | ORAL | Status: DC
  Administered 2019-12-23 – 2019-12-27 (×12): 1 g via ORAL
  Filled 2019-12-23 (×19): qty 10

## 2019-12-23 MED ORDER — MAGNESIUM HYDROXIDE 400 MG/5ML PO SUSP
30.0000 mL | Freq: Every day | ORAL | Status: DC | PRN
  Administered 2019-12-27: 30 mL via ORAL
  Filled 2019-12-23: qty 30

## 2019-12-23 MED ORDER — TRAZODONE HCL 50 MG PO TABS: 25.0000 mg | ORAL_TABLET | Freq: Every evening | ORAL | Status: DC | PRN

## 2019-12-23 MED ORDER — ALBUTEROL SULFATE (2.5 MG/3ML) 0.083% IN NEBU: 3.0000 mL | INHALATION_SOLUTION | Freq: Four times a day (QID) | RESPIRATORY_TRACT | Status: DC | PRN

## 2019-12-23 MED ORDER — FESOTERODINE FUMARATE ER 8 MG PO TB24
8.0000 mg | ORAL_TABLET | Freq: Every day | ORAL | Status: DC
  Administered 2019-12-23 – 2019-12-27 (×5): 8 mg via ORAL
  Filled 2019-12-23 (×5): qty 1

## 2019-12-23 MED ORDER — DARIFENACIN HYDROBROMIDE ER 7.5 MG PO TB24
7.5000 mg | ORAL_TABLET | Freq: Every day | ORAL | Status: DC
  Administered 2019-12-23 – 2019-12-27 (×5): 7.5 mg via ORAL
  Filled 2019-12-23 (×5): qty 1

## 2019-12-23 MED ORDER — ONDANSETRON HCL 4 MG PO TABS
4.0000 mg | ORAL_TABLET | Freq: Four times a day (QID) | ORAL | Status: DC | PRN
  Administered 2019-12-24: 4 mg via ORAL

## 2019-12-23 MED ORDER — ACETAMINOPHEN 325 MG PO TABS: 650.0000 mg | ORAL_TABLET | Freq: Four times a day (QID) | ORAL | Status: DC | PRN

## 2019-12-23 MED ORDER — STROKE: EARLY STAGES OF RECOVERY BOOK: Freq: Once | Status: DC

## 2019-12-23 MED ORDER — IPRATROPIUM-ALBUTEROL 0.5-2.5 (3) MG/3ML IN SOLN
3.0000 mL | Freq: Four times a day (QID) | RESPIRATORY_TRACT | Status: DC
  Administered 2019-12-23 – 2019-12-26 (×11): 3 mL via RESPIRATORY_TRACT
  Filled 2019-12-23 (×12): qty 3

## 2019-12-23 MED ORDER — ACETAMINOPHEN 650 MG RE SUPP: 650.0000 mg | Freq: Four times a day (QID) | RECTAL | Status: DC | PRN

## 2019-12-23 MED ORDER — CLOBETASOL PROPIONATE 0.05 % EX CREA: 1.0000 "application " | TOPICAL_CREAM | Freq: Two times a day (BID) | CUTANEOUS | Status: DC

## 2019-12-23 MED ORDER — DABIGATRAN ETEXILATE MESYLATE 150 MG PO CAPS
150.0000 mg | ORAL_CAPSULE | Freq: Two times a day (BID) | ORAL | Status: DC
  Administered 2019-12-23 – 2019-12-24 (×2): 150 mg via ORAL
  Filled 2019-12-23 (×4): qty 1

## 2019-12-23 MED ORDER — VITAMIN D (ERGOCALCIFEROL) 1.25 MG (50000 UNIT) PO CAPS
50000.0000 [IU] | ORAL_CAPSULE | ORAL | Status: DC
  Filled 2019-12-23: qty 1

## 2019-12-23 NOTE — Progress Notes (Signed)
PROGRESS NOTE    [name]  ZOX:[id number] DOB: 05-02-1950 DOA: 12/21/2019 PCP: [name], [name], MD    Assessment & Plan:   Active Problems:   Acute embolic stroke [facility])    [name]  is a 69 y.o. Caucasian female with a known history of multiple medical problems including type 2 diabetes mellitus, hypertension, hypothyroidism, depression and asthma, presented to the emergency room with acute onset of altered mental status with confusion as well as generalized weakness more on the left than the right side in th up extremity.  She admitted to recurrent dizziness.  No chest pain or dyspnea or palpitations.  She has been having paresthesias with her symptoms.  She admitted to expressive dysphasia without dysphagia.  No nausea vomiting or abdominal pain.  No bleeding diathesis.    1.  Acute bilateral likely embolic ischemic infarctions  --presented with generalized weakness and altered mental status with confusion and left-sided hemiparesis.   --MRI of the brain showed bilateral small acute infarcts with the largest being in the right centrum semiovale.  Per neuro, Cardioembolic etiology likely with history of atrial fibrillation.  Patient on home Pradaxa.   --MRA shows no evidence of large vessel occlusion.  Carotid US no significant stenosis.  A1c 6.9.  LDL 44 PLAN: --TTE  --cont home Pradaxa, per neuro --d/c warfarin --may consider ASA 81, however, listed as allergy --tele --OT rec SNF --cont statin  2.  Essential hypertension. -cont amlodipine, losartan, HCTZ and Toprol  3.  Gout. -We will continue colchicine.  4.  Depression and anxiety. -cont Bupropion, Cymbalta --cont home Xanax PRN  5.  Hypothyroidism. -cont home Synthroid  6.  Type 2 diabetes mellitus with peripheral neuropathy. -A1c 6.9. --no need for fingersticks. -We will continue Lyrica.  7.  GERD. -We will continue PPI therapy as well as Carafate.   DVT prophylaxis: WJ:[id number]   Code Status: Full code  Family Communication:  Status is: inpatient Dispo:   The patient is from: home Anticipated d/c is to: SNF Anticipated d/c date is: whenever bed available Patient currently is not medically stable to d/c due to: weakness, acute stroke, need safe disposition, pending SNF placement   Subjective and Interval History:  ROS and hx was unexpected difficult to obtain as pt tended to veer off into tangents.  When asked if pt had trouble swallowing, pt mentioned hx of esophageal candidiasis.  When asked if pt had weakness, pt mentioned hx of wounds on her legs and that she was not supposed to put weight on her feet.     Objective: Vitals:   12/23/19 1300 12/23/19 1500 12/23/19 1529 12/23/19 1821  BP: 121/65 112/89 (!) 125/59 116/77  Pulse: 74 81 77 62  Resp:   18 16  Temp:      TempSrc:      SpO2:   100% 100%  Weight:      Height:        Intake/Output Summary (Last 24 hours) at 12/23/2019 1908 Last data filed at 12/23/2019 9562 Gross per 24 hour  Intake --  Output 400 ml  Net -400 ml   Filed Weights   12/20/19 1422  Weight: 115.4 kg    Examination:   Constitutional: NAD, AAOx3 HEENT: conjunctivae and lids normal, EOMI CV: RRR no M,R,G. Distal pulses +2.  No cyanosis.   RESP: normal respiratory effort  GI: +BS, NTND Extremities: both legs appeared small and atrophied.  SKIN: warm, dry and intact Neuro: II - XII grossly  intact.  Sensation intact Psych: anxious and depressed mood and affect.     Data Reviewed: I have personally reviewed following labs and imaging studies  CBC: Recent Labs  Lab 12/19/19 1700 12/19/19 2132 12/23/19 0644  WBC 8.3 7.0 9.2  NEUTROABS  --  4.7  --   HGB 9.9* 9.7* 10.3*  HCT 32.5* 31.6* 30.6*  MCV 84.4 84.3 78.3*  PLT 286 302 017   Basic Metabolic Panel: Recent Labs  Lab 12/19/19 1700 12/22/19 0712 12/23/19 0644  NA 136  --  135  K 4.7  --  3.6  CL 102  --  99  CO2 26  --  25  GLUCOSE 104*  --  144*   BUN 23  --  16  CREATININE 1.31* 0.93 0.92  CALCIUM 8.7*  --  8.3*   GFR: Estimated Creatinine Clearance (by C-G formula based on SCr of 0.92 mg/dL) Female: 72.2 mL/min Female: 87.8 mL/min Liver Function Tests: Recent Labs  Lab 12/19/19 2132  AST 14*  ALT 10  ALKPHOS 84  BILITOT 0.5  PROT 7.0  ALBUMIN 3.5   No results for input(s): LIPASE, AMYLASE in the last 168 hours. No results for input(s): AMMONIA in the last 168 hours. Coagulation Profile: Recent Labs  Lab 12/23/19 0644  INR 1.3*   Cardiac Enzymes: No results for input(s): CKTOTAL, CKMB, CKMBINDEX, TROPONINI in the last 168 hours. BNP (last 3 results) No results for input(s): PROBNP in the last 8760 hours. HbA1C: Recent Labs    12/23/19 0644  HGBA1C 6.9*   CBG: No results for input(s): GLUCAP in the last 168 hours. Lipid Profile: Recent Labs    12/23/19 0644  CHOL 122  HDL 63  LDLCALC 44  TRIG 77  CHOLHDL 1.9   Thyroid Function Tests: No results for input(s): TSH, T4TOTAL, FREET4, T3FREE, THYROIDAB in the last 72 hours. Anemia Panel: No results for input(s): VITAMINB12, FOLATE, FERRITIN, TIBC, IRON, RETICCTPCT in the last 72 hours. Sepsis Labs: Recent Labs  Lab 12/19/19 1700 12/20/19 0142  LATICACIDVEN 2.0* 1.1    Recent Results (from the past 240 hour(s))  Urine culture     Status: Abnormal   Collection Time: 12/20/19  1:42 AM   Specimen: Urine, Random  Result Value Ref Range Status   Specimen Description   Final    URINE, RANDOM Performed at [facility], [address] [place], [place] [address]    Special Requests   Final    NONE Performed at [facility], [address] [place], [place] [address]    Culture MULTIPLE SPECIES PRESENT, SUGGEST RECOLLECTION (A)  Final   Report Status 12/20/2019 FINAL  Final  Respiratory Panel by RT PCR (Flu A&B, Covid) - Nasopharyngeal Swab     Status: None   Collection Time: 12/23/19  1:13 AM   Specimen: Nasopharyngeal Swab   Result Value Ref Range Status   SARS Coronavirus 2 by RT PCR NEGATIVE NEGATIVE Final    Comment: (NOTE) SARS-CoV-2 target nucleic acids are NOT DETECTED.  The SARS-CoV-2 RNA is generally detectable in upper respiratoy specimens during the acute phase of infection. The lowest concentration of SARS-CoV-2 viral copies this assay can detect is 131 copies/mL. A negative result does not preclude SARS-Cov-2 infection and should not be used as the sole basis for treatment or other patient management decisions. A negative result may occur with  improper specimen collection/handling, submission of specimen other than nasopharyngeal swab, presence of viral mutation(s) within the areas targeted  by this assay, and inadequate number of viral copies (<131 copies/mL). A negative result must be combined with clinical observations, patient history, and epidemiological information. The expected result is Negative.  Fact Sheet for Patients:  PinkCheek.be  Fact Sheet for Healthcare Providers:  GravelBags.it  This test is no t yet approved or cleared by the [place] FDA and  has been authorized for detection and/or diagnosis of SARS-CoV-2 by FDA under an Emergency Use Authorization (EUA). This EUA will remain  in effect (meaning this test can be used) for the duration of the COVID-19 declaration under Section 564(b)(1) of the Act, 21 U.S.C. section 360bbb-3(b)(1), unless the authorization is terminated or revoked sooner.     Influenza A by PCR NEGATIVE NEGATIVE Final   Influenza B by PCR NEGATIVE NEGATIVE Final    Comment: (NOTE) The Xpert Xpress SARS-CoV-2/FLU/RSV assay is intended as an aid in  the diagnosis of influenza from Nasopharyngeal swab specimens and  should not be used as a sole basis for treatment. Nasal washings and  aspirates are unacceptable for Xpert Xpress SARS-CoV-2/FLU/RSV  testing.  Fact Sheet for  Patients: PinkCheek.be  Fact Sheet for Healthcare Providers: GravelBags.it  This test is not yet approved or cleared by the [place] FDA and  has been authorized for detection and/or diagnosis of SARS-CoV-2 by  FDA under an Emergency Use Authorization (EUA). This EUA will remain  in effect (meaning this test can be used) for the duration of the  Covid-19 declaration under Section 564(b)(1) of the Act, 21  U.S.C. section 360bbb-3(b)(1), unless the authorization is  terminated or revoked. Performed at [facility], [address], [place],  [address]       Radiology Studies: MR ANGIO HEAD WO CONTRAST  Result Date: 12/22/2019 CLINICAL DATA:  69 year old female status post fall. Weakness. Atrial fibrillation. Several small acute infarcts on MRI today. EXAM: MRA HEAD WITHOUT CONTRAST TECHNIQUE: Angiographic images of the Circle of Willis were obtained using MRA technique without intravenous contrast. COMPARISON:  MRI today reported separately. FINDINGS: Mildly motion degraded. Antegrade flow in the posterior circulation with dominant left vertebral artery. Patent vertebrobasilar junction. No distal vertebral or basilar stenosis. Patent SCA and PCA origins, with a fetal type left PCA origin and small right posterior communicating artery. Bilateral PCA branches are within normal limits when allowing for motion. Antegrade flow in both ICA siphons. No siphon stenosis. Infundibulum suspected at the origin of the left ophthalmic artery (normal variant). Posterior communicating artery origins are within normal limits. Patent carotid termini. Patent MCA M1 and ACA A1 segments, the left A1 is dominant. Anterior communicating artery and visible ACA branches are within normal limits. Both carotid bifurcations are patent. No MCA branch occlusion is identified. IMPRESSION: 1. Negative for large vessel occlusion. 2. Some  circle-of-Willis branch detail is degraded by motion, but relatively mild for age intracranial atherosclerosis is suspected. 3. Infundibulum suspected at the left ophthalmic artery origin (normal variant). Electronically Signed   By: Genevie Ann M.D.   On: 12/22/2019 22:14   MR BRAIN WO CONTRAST  Result Date: 12/22/2019 CLINICAL DATA:  69 year old female status post fall. Weakness. Atrial fibrillation. EXAM: MRI HEAD WITHOUT CONTRAST TECHNIQUE: Multiplanar, multiecho pulse sequences of the brain and surrounding structures were obtained without intravenous contrast. COMPARISON:  Head CT yesterday.  Brain MRI 01/14/2005. FINDINGS: Brain: There are 4 small foci of restricted diffusion identified in the bilateral cerebral hemispheres. The largest measures about 10 mm in the posterior right centrum semiovale (series 7,  image 16). There are also punctate foci of cortical restricted diffusion in the contralateral left parietal lobe, right lateral parietal lobe, left occipital lobe. Mild if any associated T2 and FLAIR hyperintensity in those areas with no acute hemorrhage or mass effect. Patchy and confluent bilateral cerebral white matter T2 and FLAIR hyperintensity plus occasional small areas of cortical encephalomalacia (right parietal lobe series 15, image 36), dilated perivascular spaces throughout the deep gray nuclei, probably several chronic deep gray nuclei lacunar infarcts, chronic lacune of the central pons, and a small number of chronic microhemorrhages in the deep gray nuclei (thalami series 13, images 30 and 31). No superimposed midline shift, mass effect, evidence of mass lesion, ventriculomegaly, extra-axial collection or acute intracranial hemorrhage. Cervicomedullary junction and pituitary are within normal limits. Vascular: Major intracranial vascular flow voids are stable since 2006. Skull and upper cervical spine: Negative visible cervical spine. Bone marrow signal appears stable since 2006 and within  normal limits. Sinuses/Orbits: Negative. Other: Mastoids are clear. Grossly normal visible internal auditory structures. Negative visible scalp and face. IMPRESSION: 1. Positive for several small acute infarcts in the both cerebral hemispheres - the largest in the posterior right centrum semiovale is 10 mm. No associated hemorrhage or mass effect. This pattern could indicate a recent embolic event in the setting of atrial fibrillation, or might reflect synchronous small vessel disease (#2). 2. Underlying advanced chronic small vessel disease, including occasional small chronic cortical infarcts and several chronic microhemorrhages in the bilateral deep gray nuclei. Electronically Signed   By: Genevie Ann M.D.   On: 12/22/2019 22:10   US Carotid Bilateral (at [facility] and [facility] only)  Result Date: 12/23/2019 CLINICAL DATA:  Stroke.  Hypertension, hyperlipidemia, diabetes EXAM: BILATERAL CAROTID DUPLEX ULTRASOUND TECHNIQUE: Pearline Cables scale imaging, color Doppler and duplex ultrasound were performed of bilateral carotid and vertebral arteries in the neck. Technologist describes technically difficult study secondary to altered mental status of the patient. COMPARISON:  None. FINDINGS: Criteria: Quantification of carotid stenosis is based on velocity parameters that correlate the residual internal carotid diameter with NASCET-based stenosis levels, using the diameter of the distal internal carotid lumen as the denominator for stenosis measurement. The following velocity measurements were obtained: RIGHT ICA: 85/19 cm/sec CCA: 69/67 cm/sec SYSTOLIC ICA/CCA RATIO:  1.2 ECA: 109 cm/sec LEFT ICA: 84/22 cm/sec CCA: 89/38 cm/sec SYSTOLIC ICA/CCA RATIO:  1.1 ECA: 98 cm/sec RIGHT CAROTID ARTERY: Eccentric calcified plaque in the bulb with only mild stenosis. Mild tortuosity. Normal waveforms and color Doppler signal throughout. RIGHT VERTEBRAL ARTERY:  Normal flow direction and waveform. LEFT CAROTID ARTERY: Minimal plaque in the bulb. No  significant stenosis. Normal waveforms and color Doppler signal throughout. LEFT VERTEBRAL ARTERY:  Normal flow direction and waveform. IMPRESSION: 1. Bilateral carotid bifurcation plaque resulting in less than 50% diameter ICA stenosis. 2. Antegrade bilateral vertebral arterial flow. Electronically Signed   By: Lucrezia Europe M.D.   On: 12/23/2019 10:25     Scheduled Meds:   stroke: mapping our early stages of recovery book   Does not apply Once   amLODipine  5 mg Oral Daily   buPROPion  150 mg Oral Daily   cetirizine  5 mg Oral QPM   dabigatran  150 mg Oral BID   darifenacin  7.5 mg Oral Daily   DULoxetine  60 mg Oral Daily   fesoterodine  8 mg Oral Daily   losartan  50 mg Oral Daily   And   hydrochlorothiazide  12.5 mg Oral Daily   ipratropium-albuterol  3 mL Nebulization Q6H   ipratropium-albuterol  3 mL Nebulization Q6H   levothyroxine  175 mcg Oral QAC breakfast   metoprolol succinate  50 mg Oral Daily   mometasone-formoterol  2 puff Inhalation BID   montelukast  10 mg Oral QHS   oxybutynin  10 mg Oral QHS   pantoprazole  40 mg Oral Daily   pregabalin  50 mg Oral TID   sucralfate  1 g Oral TID with meals   Continuous Infusions:   LOS: 1 day     Enzo Bi, MD [facility] If 7PM-7AM, please contact night-coverage 12/23/2019, 7:08 PM

## 2019-12-23 NOTE — ED Notes (Signed)
Pt states that lyrica helped her arm pain

## 2019-12-23 NOTE — Consult Note (Signed)
Requesting Physician: Billie Ruddy    Chief Complaint: weakness  I have been asked by Dr. Billie Ruddy to see this patient in consultation for acute infarct.  HPI: [name] is an 69 y.o. adult female with a known history of multiple medical problems including atrial fibrillation on Pradaxa, type 2 diabetes mellitus, hypertension, hypothyroidism, depression and asthma, presented to the emergency room with acute onset of altered mental status with confusion as well as generalized weakness more on the left than the right side.  Patent also describes LUE pain.   Symptoms present for the last few days.  Unclear LKW. Initial NIHSS of 7.    Date last known well: Unable to determine Time last known well: Unable to determine tPA Given: No: Unable to determine LKW  Past Medical History:  Diagnosis Date  . Abnormal antibody titer   . Anxiety and depression   . Arthritis   . Asthma   . Cystocele   . Depression   . Diabetes ([place])   . DVT (deep venous thrombosis) (HCC)    Righ calf  . Dyspnea    11/08/2018 - "more now due to lack of exercise"  . Dysrhythmia    afib  . Edema   . GERD (gastroesophageal reflux disease)   . Heart murmur    Echocardiogram June 2015: Mild MR (possible vegetation seen on TTE, not seen on TEE), normal LV size with moderate concentric LVH. Normal function EF 60-65%. Normal diastolic function. Mild LA dilation.  Marland Kitchen History of IBS   . History of kidney stones   . History of methicillin resistant staphylococcus aureus (MRSA) 2008  . Hyperlipidemia   . Hypertension   . Hypothyroidism   . Insomnia   . Kidney stone    kidney stones with lithotripsy .  Startex Kidney- "adrenal glands"  . Neuropathy involving both lower extremities   . Obesity, Class II, BMI 35-39.9, with comorbidity   . Paroxysmal atrial fibrillation ([place]) 2007   In June 2015, Cardiac Event Monitor: Mostly SR/sinus arrhythmia with PVCs that are frequent. Short bursts of A. fib lasting several minutes;;  CHA2DS2-VASc Score = 5 (age, Female, PVD, DM, HTN)  . Peripheral vascular disease ([place])   . Sleep apnea    use C-PAP  . Urinary incontinence   . Venous stasis dermatitis of both lower extremities     Past Surgical History:  Procedure Laterality Date  . ANKLE SURGERY Right   . APPLICATION OF WOUND VAC Left 06/27/2015   Procedure: APPLICATION OF WOUND VAC ( POSSIBLE ) ;  Surgeon: [name], MD;  Location: [facility] ORS;  Service: Vascular;  Laterality: Left;  . APPLICATION OF WOUND VAC Left 09/25/2016   Procedure: APPLICATION OF WOUND VAC;  Surgeon: [name], DPM;  Location: [facility];  Service: Podiatry;  Laterality: Left;  . arm surgery     right    . CHOLECYSTECTOMY    . COLONOSCOPY    . COLONOSCOPY WITH PROPOFOL N/A 01/11/2017   Procedure: COLONOSCOPY WITH PROPOFOL;  Surgeon: [name], MD;  Location: [facility] ENDOSCOPY;  Service: Endoscopy;  Laterality: N/A;  . COLONOSCOPY WITH PROPOFOL N/A 02/22/2017   Procedure: COLONOSCOPY WITH PROPOFOL;  Surgeon: [name], MD;  Location: [facility] ENDOSCOPY;  Service: Endoscopy;  Laterality: N/A;  . COLONOSCOPY WITH PROPOFOL N/A 05/31/2017   Procedure: COLONOSCOPY WITH PROPOFOL;  Surgeon: [name], MD;  Location: [facility] ENDOSCOPY;  Service: Endoscopy;  Laterality: N/A;  . ESOPHAGOGASTRODUODENOSCOPY (EGD) WITH PROPOFOL N/A 01/11/2017  Procedure: ESOPHAGOGASTRODUODENOSCOPY (EGD) WITH PROPOFOL;  Surgeon: [name], MD;  Location: [facility] ENDOSCOPY;  Service: Endoscopy;  Laterality: N/A;  . ESOPHAGOGASTRODUODENOSCOPY (EGD) WITH PROPOFOL N/A 10/25/2018   Procedure: ESOPHAGOGASTRODUODENOSCOPY (EGD) WITH PROPOFOL;  Surgeon: [name], MD;  Location: [facility] ENDOSCOPY;  Service: Endoscopy;  Laterality: N/A;  . ESOPHAGOGASTRODUODENOSCOPY (EGD) WITH PROPOFOL N/A 11/29/2019   Procedure: ESOPHAGOGASTRODUODENOSCOPY (EGD) WITH PROPOFOL;  Surgeon: Toledo, Benay Pike, MD;  Location: [facility] ENDOSCOPY;  Service: Gastroenterology;  Laterality:  N/A;  . HERNIA REPAIR     umbilical  . HIATAL HERNIA REPAIR    . I & D EXTREMITY Left 06/27/2015   Procedure: IRRIGATION AND DEBRIDEMENT EXTREMITY            ( CALF HEMATOMA ) POSSIBLE WOUND VAC;  Surgeon: [name], MD;  Location: [facility]S;  Service: Vascular;  Laterality: Left;  . INCISION AND DRAINAGE OF WOUND Left 09/25/2016   Procedure: IRRIGATION AND DEBRIDEMENT - PARTIAL RESECTION OF ACHILLES TENDON WITH WOUND VAC APPLICATION;  Surgeon: [name], DPM;  Location: [facility] ORS;  Service: Podiatry;  Laterality: Left;  . INCISION AND DRAINAGE OF WOUND Left 11/09/2018   Procedure: Excision of left foot wound with ACell placement;  Surgeon: Wallace Going, DO;  Location: [place];  Service: Plastics;  Laterality: Left;  . IRRIGATION AND DEBRIDEMENT ABSCESS Left 09/07/2016   Procedure: IRRIGATION AND DEBRIDEMENT ABSCESS LEFT HEEL;  Surgeon: [name], DPM;  Location: [facility];  Service: Podiatry;  Laterality: Left;  . JOINT REPLACEMENT  2013   left knee replacement  . KIDNEY SURGERY Right    kidney stones  . LITHOTRIPSY    . LOWER EXTREMITY ANGIOGRAPHY Left 04/04/2018   Procedure: LOWER EXTREMITY ANGIOGRAPHY;  Surgeon: [name], MD;  Location: [facility];  Service: Cardiovascular;  Laterality: Left;  . NM GATED MYOVIEW ([place] [facility])  February 2017   Likely breast attenuation. LOW RISK study. Normal EF 55-60%.  . OTHER SURGICAL HISTORY     08/2016 or 09/2016 surgery on achilles tenso h/o staph infection heal. Dr. [name]  . removal of left hematoma Left    leg  . REVERSE SHOULDER ARTHROPLASTY Right 10/08/2015   Procedure: REVERSE SHOULDER ARTHROPLASTY;  Surgeon: [name], MD;  Location: [facility];  Service: Orthopedics;  Laterality: Right;  . SLEEVE GASTROPLASTY    . TONSILLECTOMY    . TOTAL KNEE ARTHROPLASTY Left   . TOTAL SHOULDER REPLACEMENT Right   . TRANSESOPHAGEAL ECHOCARDIOGRAM  08/08/2013   Mild LVH, EF 60-65%. Moderate LA dilation and mild RA dilation. Mild  MR with no evidence of stenosis and no evidence of endocarditis. A false chordae is noted.  . TRANSTHORACIC ECHOCARDIOGRAM  08/03/2013   Mild-Moderate concentric LVH, EF 60-65%. Normal diastolic function. Mild LA dilation. Mild MR with possible vegetation  - not confirmed on TEE   . ULNAR NERVE TRANSPOSITION Right 08/06/2016   Procedure: ULNAR NERVE DECOMPRESSION/TRANSPOSITION;  Surgeon: [name], MD;  Location: [facility];  Service: Orthopedics;  Laterality: Right;  . UPPER GI ENDOSCOPY    . VAGINAL HYSTERECTOMY      Family History  Problem Relation Age of Onset  . Skin cancer Father   . Diabetes Father   . Hypertension Father   . Peripheral vascular disease Father   . Cancer Father   . Cerebral aneurysm Father   . Alcohol abuse Father   . Varicose Veins Mother   . Kidney disease Mother   . Arthritis Mother   .  Mental illness Sister   . Cancer Maternal Aunt        breast  . Breast cancer Maternal Aunt   . Arthritis Maternal Grandmother   . Hypertension Maternal Grandmother   . Diabetes Maternal Grandmother   . Arthritis Maternal Grandfather   . Heart disease Maternal Grandfather   . Hypertension Maternal Grandfather   . Arthritis Paternal Grandmother   . Hypertension Paternal Grandmother   . Diabetes Paternal Grandmother   . Arthritis Paternal Grandfather   . Hypertension Paternal Grandfather   . Bladder Cancer Neg Hx   . Kidney cancer Neg Hx    Social History:  reports that she has never smoked. She has never used smokeless tobacco. She reports that she does not drink alcohol and does not use drugs.  Allergies:  Allergies  Allergen Reactions  . Morphine And Related Other (See Comments) and Nausea Only    Patient becomes very confused Other reaction(s): Other (See Comments), Other (See Comments) Patient becomes very confused Patient becomes very confused   . Penicillins Hives, Shortness Of Breath, Swelling and Other (See Comments)    Facial swelling Has patient  had a PCN reaction causing immediate rash, facial/tongue/throat swelling, SOB or lightheadedness with hypotension: Yes Has patient had a PCN reaction causing severe rash involving mucus membranes or skin necrosis: Yes Has patient had a PCN reaction that required hospitalization No Has patient had a PCN reaction occurring within the last 10 years: No If all of the above answers are "NO", then may proceed with Cephalosporin use.  Other reaction(s): Other (See Comments), Other (See Comments) Facial swelling Has patient had a PCN reaction causing immediate rash, facial/tongue/throat swelling, SOB or lightheadedness with hypotension: Yes Has patient had a PCN reaction causing severe rash involving mucus membranes or skin necrosis: Yes Has patient had a PCN reaction that required hospitalization No Has patient had a PCN reaction occurring within the last 10 years: No If all of the above answers are "NO", then may proceed with Cephalosporin use. Facial swelling Has patient had a PCN reaction causing immediate rash, facial/tongue/throat swelling, SOB or lightheadedness with hypotension: Yes Has patient had a PCN reaction causing severe rash involving mucus membranes or skin necrosis: Yes Has patient had a PCN reaction that required hospitalization No Has patient had a PCN reaction occurring within the last 10 years: No If all of the above answers are "NO", then may proceed with Cephalosporin use. Facial swelling Has patient had a PCN reaction causing immediate rash, facial/tongue/throat swelling, SOB or lightheadedness with hypotension: Yes Has patient had a PCN reaction causing severe rash involving mucus membranes or skin necrosis: Yes Has patient had a PCN reaction that required hospitalization No Has patient had a PCN reaction occurring within the last 10 years: No If all of the above answers are "NO", then may proceed with Cephalosporin use.   . Ambien [Zolpidem]     Hallucination   .  Aspirin Hives  . Oxytetracycline Hives    Other reaction(s): Unknown     Medications: I have reviewed the patient's current medications. Prior to Admission medications   Medication Sig Start Date End Date Taking? Authorizing Provider  acetaminophen (TYLENOL) 325 MG tablet Take 2 tablets (650 mg total) by mouth every 6 (six) hours as needed for mild pain or moderate pain (or Fever >/= 101). 09/08/19  Yes Guilford Shi, MD  albuterol (PROVENTIL) (2.5 MG/3ML) 0.083% nebulizer solution Take 2.5 mg by nebulization every 4 (four) hours as needed for wheezing  or shortness of breath.   Yes [provider]  albuterol (VENTOLIN HFA) 108 (90 Base) MCG/ACT inhaler Inhale 2 puffs into the lungs every 6 (six) hours as needed for wheezing or shortness of breath.   Yes [provider]  ALPRAZolam Duanne Moron) 0.5 MG tablet Take 0.5 tablets (0.25 mg total) by mouth 2 (two) times daily as needed for anxiety. 10/27/19  Yes [name], [name], MD  amLODipine (NORVASC) 5 MG tablet Take 1 tablet (5 mg total) by mouth daily. 11/28/19 02/26/20 Yes Loel Dubonnet, NP  buPROPion (WELLBUTRIN XL) 150 MG 24 hr tablet Take 1 tablet (150 mg total) by mouth daily. Patient taking differently: Take 300 mg by mouth daily.  05/02/19  Yes [name], [name], MD  clobetasol cream (TEMOVATE) 8.10 % Apply 1 application topically 2 (two) times daily.   Yes [provider]  colchicine 0.6 MG tablet Take 0.6 mg by mouth 2 (two) times daily.   Yes [provider]  dabigatran (PRADAXA) 150 MG CAPS capsule Take 1 capsule (150 mg total) by mouth 2 (two) times daily. Please call to schedule office visit for further refills. Thank you! 10/05/19  Yes Wellington Hampshire, MD  DULoxetine (CYMBALTA) 60 MG capsule Take 60 mg by mouth daily.   Yes [provider]  ergocalciferol (VITAMIN D2) 1.25 MG (50000 UT) capsule Take 50,000 Units by mouth once a week.   Yes [provider]  estradiol  (ESTRACE) 0.1 MG/GM vaginal cream Place 1 Applicatorful vaginally every Monday, Wednesday, and Friday. Apply 0.5mg  (pea-sized amount)  just inside the vaginal introitus with a finger-tip on Monday, Wednesday and Friday nights, 02/01/19  Yes McGowan, [name] A, PA-C  fluticasone-salmeterol (ADVAIR HFA) 115-21 MCG/ACT inhaler Inhale 2 puffs into the lungs daily. Rinse mouth thoroughly after use 08/16/19  Yes Tyler Pita, MD  HYDROcodone-acetaminophen (NORCO/VICODIN) 5-325 MG tablet Take 1 tablet by mouth every 8 (eight) hours as needed for severe pain. Must last 30 days 11/25/19 12/25/19 Yes Gillis Santa, MD  levocetirizine (XYZAL) 5 MG tablet Take 0.5 tablets (2.5 mg total) by mouth every evening. Patient taking differently: Take 5 mg by mouth every evening.  09/07/19  Yes [name], [name], MD  levothyroxine (SYNTHROID) 175 MCG tablet Take 1 tablet (175 mcg total) by mouth daily before breakfast. 03/10/19  Yes [name], [name], MD  losartan-hydrochlorothiazide (HYZAAR) 50-12.5 MG tablet Take 1 tablet by mouth daily.   Yes [provider]  metFORMIN (GLUCOPHAGE) 1000 MG tablet Take 1 tablet (1,000 mg total) by mouth 2 (two) times daily. With food Patient taking differently: Take 500-1,000 mg by mouth 2 (two) times daily. Take 1 tablet every morning and 1/2 tablet every night. 12/14/18  Yes [name], [name], MD  metoprolol succinate (TOPROL-XL) 50 MG 24 hr tablet TAKE 1 TABLET BY MOUTH DAILY 11/09/19  Yes Loel Dubonnet, NP  montelukast (SINGULAIR) 10 MG tablet Take 1 tablet (10 mg total) by mouth at bedtime. 02/03/19  Yes [name], [name], MD  ondansetron (ZOFRAN) 4 MG tablet Take 1 tablet (4 mg total) by mouth every 8 (eight) hours as needed for nausea or vomiting. 02/14/19  Yes [name], [name], MD  oxybutynin (DITROPAN XL) 10 MG 24 hr tablet Take 10 mg by mouth at bedtime.   Yes [provider]  pantoprazole (PROTONIX) 40 MG tablet Take 40  mg by mouth daily.  04/07/19 04/06/20 Yes [provider]  pregabalin (LYRICA) 50 MG capsule Take 1 capsule (50 mg  total) by mouth 3 (three) times daily. 11/22/19 05/20/20 Yes Gillis Santa, MD  sucralfate (CARAFATE) 1 GM/10ML suspension Take 1 g by mouth 3 (three) times daily.  12/06/19  Yes [provider]  TOVIAZ 8 MG TB24 tablet TAKE ONE TABLET BY MOUTH EVERY DAY 08/07/19  Yes McGowan, [name] A, PA-C  trospium (SANCTURA) 20 MG tablet Take 20 mg by mouth 2 (two) times daily.   Yes [provider]  HYDROcodone-acetaminophen (NORCO/VICODIN) 5-325 MG tablet Take 1 tablet by mouth every 8 (eight) hours as needed for severe pain. Must last 30 days 12/25/19 01/24/20  Gillis Santa, MD  HYDROcodone-acetaminophen (NORCO/VICODIN) 5-325 MG tablet Take 1 tablet by mouth every 8 (eight) hours as needed for severe pain. Must last 30 days 01/24/20 02/23/20  Gillis Santa, MD  meloxicam (MOBIC) 15 MG tablet Take 15 mg by mouth daily. Patient not taking: Reported on 12/21/2019    [provider]    ROS: History obtained from the patient  General ROS: negative for - chills, fatigue, fever, night sweats, weight gain or weight loss Psychological ROS: negative for - behavioral disorder, hallucinations, memory difficulties, mood swings or suicidal ideation Ophthalmic ROS: negative for - blurry vision, double vision, eye pain or loss of vision ENT ROS: negative for - epistaxis, nasal discharge, oral lesions, sore throat, tinnitus or vertigo Allergy and Immunology ROS: negative for - hives or itchy/watery eyes Hematological and Lymphatic ROS: negative for - bleeding problems, bruising or swollen lymph nodes Endocrine ROS: negative for - galactorrhea, hair pattern changes, polydipsia/polyuria or temperature intolerance Respiratory ROS: negative for - cough, hemoptysis, shortness of breath or wheezing Cardiovascular ROS: negative for - chest pain, dyspnea on exertion, edema or irregular  heartbeat Gastrointestinal ROS: negative for - abdominal pain, diarrhea, hematemesis, nausea/vomiting or stool incontinence Genito-Urinary ROS: negative for - dysuria, hematuria, incontinence or urinary frequency/urgency Musculoskeletal ROS: left arm pain Neurological ROS: as noted in HPI Dermatological ROS: negative for rash and skin lesion changes  Physical Examination: Blood pressure 139/72, pulse 82, temperature 98.9 F (37.2 C), temperature source Oral, resp. rate 19, height 5\' 5"  (1.651 m), weight 115.4 kg, SpO2 99 %.  HEENT-  Normocephalic, no lesions, without obvious abnormality.  Normal external eye and conjunctiva.  Normal TM's bilaterally.  Normal auditory canals and external ears. Normal external nose, mucus membranes and septum.  Normal pharynx. Cardiovascular- S1, S2 normal, pulses palpable throughout   Lungs- chest clear, no wheezing, rales, normal symmetric air entry Abdomen- soft, non-tender; bowel sounds normal; no masses,  no organomegaly Extremities- no edema Lymph-no adenopathy palpable Musculoskeletal-left arm dysesthesia Skin-warm and dry, no hyperpigmentation, vitiligo, or suspicious lesions  Neurological Examination   Mental Status: Alert.  Expressive aphasia noted with word finding difficulties apparent.  Able to follow 3 step commands without difficulty. Cranial Nerves: II: Discs flat bilaterally; Visual fields grossly normal, pupils equal, round, reactive to light and accommodation III,IV, VI: ptosis not present, extra-ocular motions intact bilaterally V,VII: smile symmetric, facial light touch sensation normal bilaterally VIII: hearing normal bilaterally IX,X: gag reflex present XI: bilateral shoulder shrug XII: midline tongue extension Motor: Right : Upper extremity   5/5    Left:     Upper extremity   1-2/5  Lower extremity   5-/5     Lower extremity   4/5 with nore strength loss distally Tone and bulk:normal tone throughout; no atrophy noted Sensory:  Dysesthetic in the LUE with decreased light touch in the LLE Deep Tendon Reflexes: Symmetric throughout Plantars: Right:  mute   Left: mute Cerebellar: Normal finger-to-nose testing with the RUE Gait: not tested due to safety concerns  Laboratory Studies:  Basic Metabolic Panel: Recent Labs  Lab 12/19/19 1700 12/22/19 0712 12/23/19 0644  NA 136  --  135  K 4.7  --  3.6  CL 102  --  99  CO2 26  --  25  GLUCOSE 104*  --  144*  BUN 23  --  16  CREATININE 1.31* 0.93 0.92  CALCIUM 8.7*  --  8.3*    Liver Function Tests: Recent Labs  Lab 12/19/19 2132  AST 14*  ALT 10  ALKPHOS 84  BILITOT 0.5  PROT 7.0  ALBUMIN 3.5   No results for input(s): LIPASE, AMYLASE in the last 168 hours. No results for input(s): AMMONIA in the last 168 hours.  CBC: Recent Labs  Lab 12/19/19 1700 12/19/19 2132 12/23/19 0644  WBC 8.3 7.0 9.2  NEUTROABS  --  4.7  --   HGB 9.9* 9.7* 10.3*  HCT 32.5* 31.6* 30.6*  MCV 84.4 84.3 78.3*  PLT 286 302 308    Cardiac Enzymes: No results for input(s): CKTOTAL, CKMB, CKMBINDEX, TROPONINI in the last 168 hours.  BNP: Invalid input(s): POCBNP  CBG: No results for input(s): GLUCAP in the last 168 hours.  Microbiology: Results for orders placed or performed during the hospital encounter of 12/21/19  Respiratory Panel by RT PCR (Flu A&B, Covid) - Nasopharyngeal Swab     Status: None   Collection Time: 12/23/19  1:13 AM   Specimen: Nasopharyngeal Swab  Result Value Ref Range Status   SARS Coronavirus 2 by RT PCR NEGATIVE NEGATIVE Final    Comment: (NOTE) SARS-CoV-2 target nucleic acids are NOT DETECTED.  The SARS-CoV-2 RNA is generally detectable in upper respiratoy specimens during the acute phase of infection. The lowest concentration of SARS-CoV-2 viral copies this assay can detect is 131 copies/mL. A negative result does not preclude SARS-Cov-2 infection and should not be used as the sole basis for treatment or other patient management  decisions. A negative result may occur with  improper specimen collection/handling, submission of specimen other than nasopharyngeal swab, presence of viral mutation(s) within the areas targeted by this assay, and inadequate number of viral copies (<131 copies/mL). A negative result must be combined with clinical observations, patient history, and epidemiological information. The expected result is Negative.  Fact Sheet for Patients:  PinkCheek.be  Fact Sheet for Healthcare Providers:  GravelBags.it  This test is no t yet approved or cleared by the [place] FDA and  has been authorized for detection and/or diagnosis of SARS-CoV-2 by FDA under an Emergency Use Authorization (EUA). This EUA will remain  in effect (meaning this test can be used) for the duration of the COVID-19 declaration under Section 564(b)(1) of the Act, 21 U.S.C. section 360bbb-3(b)(1), unless the authorization is terminated or revoked sooner.     Influenza A by PCR NEGATIVE NEGATIVE Final   Influenza B by PCR NEGATIVE NEGATIVE Final    Comment: (NOTE) The Xpert Xpress SARS-CoV-2/FLU/RSV assay is intended as an aid in  the diagnosis of influenza from Nasopharyngeal swab specimens and  should not be used as a sole basis for treatment. Nasal washings and  aspirates are unacceptable for Xpert Xpress SARS-CoV-2/FLU/RSV  testing.  Fact Sheet for Patients: PinkCheek.be  Fact Sheet for Healthcare Providers: GravelBags.it  This test is not yet approved or cleared by the [place] FDA and  has been authorized for detection  and/or diagnosis of SARS-CoV-2 by  FDA under an Emergency Use Authorization (EUA). This EUA will remain  in effect (meaning this test can be used) for the duration of the  Covid-19 declaration under Section 564(b)(1) of the Act, 21  U.S.C. section 360bbb-3(b)(1), unless the  authorization is  terminated or revoked. Performed at [facility], [address] [place], [place] [address]     Coagulation Studies: Recent Labs    12/23/19 0644  LABPROT 16.1*  INR 1.3*    Urinalysis:  Recent Labs  Lab 12/20/19 0142  COLORURINE YELLOW*  LABSPEC 1.009  PHURINE 5.0  GLUCOSEU NEGATIVE  HGBUR NEGATIVE  BILIRUBINUR NEGATIVE  KETONESUR NEGATIVE  PROTEINUR NEGATIVE  NITRITE NEGATIVE  LEUKOCYTESUR NEGATIVE    Lipid Panel:    Component Value Date/Time   CHOL 122 12/23/2019 0644   CHOL 185 10/10/2014 0822   TRIG 77 12/23/2019 0644   HDL 63 12/23/2019 0644   HDL 94 10/10/2014 0822   CHOLHDL 1.9 12/23/2019 0644   VLDL 15 12/23/2019 0644   LDLCALC 44 12/23/2019 0644   LDLCALC 73 10/10/2014 0822    HgbA1C:  Lab Results  Component Value Date   HGBA1C 6.8 (H) 09/02/2019    Urine Drug Screen:  No results found for: LABOPIA, COCAINSCRNUR, LABBENZ, AMPHETMU, THCU, LABBARB  Alcohol Level: No results for input(s): ETH in the last 168 hours.  Other results: EKG: junctional rhythm at 57 bpm.  Imaging: CT Head Wo Contrast  Result Date: 12/21/2019 CLINICAL DATA:  Facial trauma; fall. EXAM: CT HEAD WITHOUT CONTRAST TECHNIQUE: Contiguous axial images were obtained from the base of the skull through the vertex without intravenous contrast. COMPARISON:  Head CT 07/28/2018.  Brain MRI 01/14/2005. FINDINGS: Brain: Stable, mild generalized cerebral atrophy. Stable moderate multifocal hypoattenuation within the cerebral white matter is nonspecific, but consistent with chronic small vessel ischemic disease. Redemonstrated chronic lacunar infarcts within the left cerebral white matter and bilateral basal ganglia. There is no acute intracranial hemorrhage. No demarcated cortical infarct. No extra-axial fluid collection. No evidence of intracranial mass. No midline shift. Partially empty sella turcica Vascular: No hyperdense vessel.  Atherosclerotic  calcifications Skull: Normal. Negative for fracture or focal lesion. Sinuses/Orbits: Visualized orbits show no acute finding. No significant paranasal sinus disease or mastoid effusion at the imaged levels. IMPRESSION: No evidence of acute intracranial abnormality. Stable mild generalized cerebral atrophy and moderate chronic small vessel ischemic disease. Redemonstrated chronic lacunar infarcts within the left cerebral white matter and bilateral basal ganglia. Electronically Signed   By: Kellie Simmering DO   On: 12/21/2019 16:29   CT HUMERUS LEFT WO CONTRAST  Result Date: 12/21/2019 CLINICAL DATA:  Left arm pain after fall EXAM: CT OF THE UPPER LEFT EXTREMITY WITHOUT CONTRAST TECHNIQUE: Multidetector CT imaging of the upper left extremity was performed according to the standard protocol. COMPARISON:  X-ray 12/20/2019 FINDINGS: Bones/Joint/Cartilage No evidence of acute fracture or dislocation. Moderate to severe osteoarthritis of the glenohumeral joint as manifested by complete joint space loss, subchondral sclerosis and cystic change and large marginal humeral head osteophytosis. Small glenohumeral joint effusion, nonspecific. Mild degenerative changes of the acromioclavicular joint. Os acromiale. No appreciable subacromial-subdeltoid fluid collection. Elbow joint appears intact without fracture or dislocation. No elbow joint effusion. No suspicious lytic or sclerotic bone lesion is identified. Ligaments Suboptimally assessed by CT. Muscles and Tendons Preservation of muscle bulk. No acute tendinous abnormality evident by CT. Soft tissues No soft tissue fluid collection or hematoma. IMPRESSION: 1. No acute osseous abnormality  of the left humerus. 2. Moderate to severe osteoarthritis of the glenohumeral joint. 3. Small glenohumeral joint effusion, nonspecific. 4. Os acromiale. Electronically Signed   By: Davina Poke D.O.   On: 12/21/2019 15:50   MR ANGIO HEAD WO CONTRAST  Result Date: 12/22/2019 CLINICAL  DATA:  69 year old female status post fall. Weakness. Atrial fibrillation. Several small acute infarcts on MRI today. EXAM: MRA HEAD WITHOUT CONTRAST TECHNIQUE: Angiographic images of the Circle of Willis were obtained using MRA technique without intravenous contrast. COMPARISON:  MRI today reported separately. FINDINGS: Mildly motion degraded. Antegrade flow in the posterior circulation with dominant left vertebral artery. Patent vertebrobasilar junction. No distal vertebral or basilar stenosis. Patent SCA and PCA origins, with a fetal type left PCA origin and small right posterior communicating artery. Bilateral PCA branches are within normal limits when allowing for motion. Antegrade flow in both ICA siphons. No siphon stenosis. Infundibulum suspected at the origin of the left ophthalmic artery (normal variant). Posterior communicating artery origins are within normal limits. Patent carotid termini. Patent MCA M1 and ACA A1 segments, the left A1 is dominant. Anterior communicating artery and visible ACA branches are within normal limits. Both carotid bifurcations are patent. No MCA branch occlusion is identified. IMPRESSION: 1. Negative for large vessel occlusion. 2. Some circle-of-Willis branch detail is degraded by motion, but relatively mild for age intracranial atherosclerosis is suspected. 3. Infundibulum suspected at the left ophthalmic artery origin (normal variant). Electronically Signed   By: Genevie Ann M.D.   On: 12/22/2019 22:14   MR BRAIN WO CONTRAST  Result Date: 12/22/2019 CLINICAL DATA:  69 year old female status post fall. Weakness. Atrial fibrillation. EXAM: MRI HEAD WITHOUT CONTRAST TECHNIQUE: Multiplanar, multiecho pulse sequences of the brain and surrounding structures were obtained without intravenous contrast. COMPARISON:  Head CT yesterday.  Brain MRI 01/14/2005. FINDINGS: Brain: There are 4 small foci of restricted diffusion identified in the bilateral cerebral hemispheres. The largest  measures about 10 mm in the posterior right centrum semiovale (series 7, image 16). There are also punctate foci of cortical restricted diffusion in the contralateral left parietal lobe, right lateral parietal lobe, left occipital lobe. Mild if any associated T2 and FLAIR hyperintensity in those areas with no acute hemorrhage or mass effect. Patchy and confluent bilateral cerebral white matter T2 and FLAIR hyperintensity plus occasional small areas of cortical encephalomalacia (right parietal lobe series 15, image 36), dilated perivascular spaces throughout the deep gray nuclei, probably several chronic deep gray nuclei lacunar infarcts, chronic lacune of the central pons, and a small number of chronic microhemorrhages in the deep gray nuclei (thalami series 13, images 30 and 31). No superimposed midline shift, mass effect, evidence of mass lesion, ventriculomegaly, extra-axial collection or acute intracranial hemorrhage. Cervicomedullary junction and pituitary are within normal limits. Vascular: Major intracranial vascular flow voids are stable since 2006. Skull and upper cervical spine: Negative visible cervical spine. Bone marrow signal appears stable since 2006 and within normal limits. Sinuses/Orbits: Negative. Other: Mastoids are clear. Grossly normal visible internal auditory structures. Negative visible scalp and face. IMPRESSION: 1. Positive for several small acute infarcts in the both cerebral hemispheres - the largest in the posterior right centrum semiovale is 10 mm. No associated hemorrhage or mass effect. This pattern could indicate a recent embolic event in the setting of atrial fibrillation, or might reflect synchronous small vessel disease (#2). 2. Underlying advanced chronic small vessel disease, including occasional small chronic cortical infarcts and several chronic microhemorrhages in the bilateral deep gray nuclei.  Electronically Signed   By: Genevie Ann M.D.   On: 12/22/2019 22:10   US Carotid  Bilateral (at [facility] and [facility] only)  Result Date: 12/23/2019 CLINICAL DATA:  Stroke.  Hypertension, hyperlipidemia, diabetes EXAM: BILATERAL CAROTID DUPLEX ULTRASOUND TECHNIQUE: Pearline Cables scale imaging, color Doppler and duplex ultrasound were performed of bilateral carotid and vertebral arteries in the neck. Technologist describes technically difficult study secondary to altered mental status of the patient. COMPARISON:  None. FINDINGS: Criteria: Quantification of carotid stenosis is based on velocity parameters that correlate the residual internal carotid diameter with NASCET-based stenosis levels, using the diameter of the distal internal carotid lumen as the denominator for stenosis measurement. The following velocity measurements were obtained: RIGHT ICA: 85/19 cm/sec CCA: 28/00 cm/sec SYSTOLIC ICA/CCA RATIO:  1.2 ECA: 109 cm/sec LEFT ICA: 84/22 cm/sec CCA: 34/91 cm/sec SYSTOLIC ICA/CCA RATIO:  1.1 ECA: 98 cm/sec RIGHT CAROTID ARTERY: Eccentric calcified plaque in the bulb with only mild stenosis. Mild tortuosity. Normal waveforms and color Doppler signal throughout. RIGHT VERTEBRAL ARTERY:  Normal flow direction and waveform. LEFT CAROTID ARTERY: Minimal plaque in the bulb. No significant stenosis. Normal waveforms and color Doppler signal throughout. LEFT VERTEBRAL ARTERY:  Normal flow direction and waveform. IMPRESSION: 1. Bilateral carotid bifurcation plaque resulting in less than 50% diameter ICA stenosis. 2. Antegrade bilateral vertebral arterial flow. Electronically Signed   By: Lucrezia Europe M.D.   On: 12/23/2019 10:25    Assessment: 69 y.o. adult female with a known history of multiple medical problems including atrial fibrillation on Pradaxa, type 2 diabetes mellitus, hypertension, hypothyroidism, depression and asthma, presented to the emergency room with acute onset of altered mental status with confusion as well as generalized weakness more on the left than the right side.  MRI of the brain personally  reviewed and shows bilateral small acute infarcts with the largest being in the right centrum semiovale.  Cardioembolic etiology likely with history of atrial fibrillation.  Patient on Pradaxa.  Unclear compliance at this time.  Patient outside time window for tPA or thrombectomy. MRA shows no evidence of large vessel occlusion.  A1c pending.  LDL 44  Stroke Risk Factors - atrial fibrillation, diabetes mellitus, hyperlipidemia and hypertension  Plan: 1. HgbA1c pending.  Goal A1c<7.0 2. PT consult, OT consult, Speech consult 3. Echocardiogram 4. Carotid dopplers 5. Continue Pradaxa.  May add low dose ASA at 81mg  daily 6. NPO until RN stroke swallow screen 7. Telemetry monitoring 8. Frequent neuro checks  Alexis Goodell, MD Neurology [phone] 12/23/2019, 11:49 AM

## 2019-12-23 NOTE — ED Notes (Signed)
Speech therapy at bedside with pt

## 2019-12-23 NOTE — ED Notes (Signed)
Bed changed. Pt had large BM in chux in bed. Peri care performed. Purewick changed.

## 2019-12-23 NOTE — ED Notes (Addendum)
Pt able to move L arm but refuses to move it due to pain.

## 2019-12-23 NOTE — ED Notes (Signed)
US at bedside

## 2019-12-23 NOTE — ED Notes (Signed)
Pt SPO2 reading noted to be 80%. Pt asleep. 2L/min O2 per  applied. Pt resting in bed. SPO2 now 95%.

## 2019-12-23 NOTE — ED Notes (Signed)
Neurology at bedside.

## 2019-12-23 NOTE — ED Notes (Signed)
Husband at bedside. ED provider at bedside. Talking with pt and husband.

## 2019-12-23 NOTE — Evaluation (Signed)
Occupational Therapy Evaluation Patient Details Name: [name] MRN: [id number] DOB: 03-18-1951 Today's Date: 12/23/2019    History of Present Illness [name] is a 69 y.o. adult with possible history of hypertension, diabetes, chronic pain, COPD, and chronic atrial fibrillation on Pradaxa who presents to the ED complaining of weakness.  History is limited due to patient's occasional confusion.  She reportedly has been increasingly weak at home over the past couple of days with multiple falls.  Patient states she has been doing fine and her husband is able to care for her, however he reported to EMS that he has been unable to do so.  Patient currently only complains of pain to her left forearm from where her husband has been pulling her.   Clinical Impression   Patient presenting with decreased I with self care, balance, functional mobility/transfers, endurance, and safety awareness. Patient reports being mod I PTA but reports getting weaker of the last few weeks and needing increasing assistance. Patient currently functioning at mod -total A +2 with self care tasks. Pain limiting pt's ability to participate in functional tasks and pt does not want therapist to even touch/move L UE this session. Patient will benefit from acute OT to increase overall independence in the areas of ADLs, functional mobility, and safety awareness in order to safely discharge to next venue of care.    Follow Up Recommendations  SNF;Supervision/Assistance - 24 hour    Equipment Recommendations  Other (comment) (defer to next venue of care)    Recommendations for Other Services Other (comment) (none at this time)     Precautions / Restrictions Precautions Precautions: Fall      Mobility Bed Mobility Overal bed mobility: Needs Assistance Bed Mobility: Sit to Supine;Supine to Sit     Supine to sit: Mod assist;HOB elevated Sit to supine: Total assist      Transfers         General transfer  comment: Unable to attempt this session.        ADL either performed or assessed with clinical judgement   ADL Overall ADL's : Needs assistance/impaired       General ADL Comments: Max A for UB self care and pt does not want therapist to touch or move L UE secondary to pain. Total A - +2 assistance for bed mobility and pt declines transfer.     Vision Patient Visual Report: No change from baseline              Pertinent Vitals/Pain Pain Assessment: Faces Faces Pain Scale: Hurts worst Pain Location: L arm Pain Descriptors / Indicators: Aching;Grimacing;Guarding;Discomfort;Sore Pain Intervention(s): Monitored during session;Premedicated before session;Limited activity within patient's tolerance     Hand Dominance Right   Extremity/Trunk Assessment Upper Extremity Assessment Upper Extremity Assessment: LUE deficits/detail LUE: Unable to fully assess due to pain LUE Sensation: WNL LUE Coordination: decreased gross motor;decreased fine motor   Lower Extremity Assessment Lower Extremity Assessment: Generalized weakness   Cervical / Trunk Assessment Cervical / Trunk Assessment: Normal   Communication Communication Communication: No difficulties   Cognition Arousal/Alertness: Awake/alert Behavior During Therapy: WFL for tasks assessed/performed Overall Cognitive Status: Impaired/Different from baseline Area of Impairment: Awareness;Safety/judgement;Orientation;Attention;Following commands    Orientation Level: Person;Place Current Attention Level: Sustained   Following Commands: Follows one step commands consistently;Follows one step commands with increased time Safety/Judgement: Decreased awareness of safety;Decreased awareness of deficits Awareness: Intellectual   General Comments: very slow processing  Home Living Family/patient expects to be discharged to:: Skilled nursing facility Living Arrangements: Spouse/significant other Available Help  at Discharge: Family;Available 24 hours/day Type of Home: House Home Access: Ramped entrance     Home Layout: One level     Bathroom Shower/Tub: Tub/shower unit         Home Equipment: Environmental consultant - 2 wheels;Walker - 4 wheels;Bedside commode   Additional Comments: Reports she has 4 steps in front, but has ramp in garage.      Prior Functioning/Environment Level of Independence: Needs assistance  Gait / Transfers Assistance Needed: Patient reports she was able to walk with walker. ADL's / Homemaking Assistance Needed: Husband assists, [name]   Comments: Sup with household mobilization with [phone]; husband assists with household responsibilities and errands as needed.  Does endorse multiple fall history.        OT Problem List: Decreased strength;Decreased coordination;Pain;Decreased cognition;Decreased activity tolerance;Decreased safety awareness;Decreased knowledge of use of DME or AE;Impaired balance (sitting and/or standing);Decreased knowledge of precautions      OT Treatment/Interventions: Self-care/ADL training;Therapeutic exercise;Therapeutic activities;Energy conservation;Patient/family education;DME and/or AE instruction;Balance training;Cognitive remediation/compensation    OT Goals(Current goals can be found in the care plan section) Acute Rehab OT Goals Patient Stated Goal: to go home OT Goal Formulation: With patient Time For Goal Achievement: 01/06/20 Potential to Achieve Goals: Fair ADL Goals Pt Will Perform Upper Body Dressing: with min assist;sitting Pt Will Transfer to Toilet: with mod assist;bedside commode Pt Will Perform Toileting - Clothing Manipulation and hygiene: with mod assist;sit to/from stand  OT Frequency: Min 1X/week   Barriers to D/C: Other (comment)  none at this time          AM-PAC OT "6 Clicks" Daily Activity     Outcome Measure Help from another person eating meals?: A Little Help from another person taking care of personal grooming?: A  Little Help from another person toileting, which includes using toliet, bedpan, or urinal?: Total Help from another person bathing (including washing, rinsing, drying)?: Total Help from another person to put on and taking off regular upper body clothing?: A Lot Help from another person to put on and taking off regular lower body clothing?: Total 6 Click Score: 11   End of Session Nurse Communication: Mobility status;Patient requests pain meds  Activity Tolerance: Patient limited by pain Patient left: in bed;with call bell/phone within reach;with bed alarm set  OT Visit Diagnosis: Muscle weakness (generalized) (M62.81);History of falling (Z91.81);Pain Pain - Right/Left: Left Pain - part of body: Arm                Time: 5701-7793 OT Time Calculation (min): 23 min Charges:  OT General Charges $OT Visit: 1 Visit OT Evaluation $OT Eval Moderate Complexity: 1 Mod OT Treatments $Self Care/Home Management : 8-22 mins  Dawn Crocker, MS, OTR/L , CBIS ascom [phone]  12/23/19, 12:33 PM

## 2019-12-23 NOTE — Evaluation (Signed)
Clinical/Bedside Swallow Evaluation Patient Details  Name: [name] MRN: [id number] Date of Birth: 03-Dec-1950  Today's Date: 12/23/2019 Time: SLP Start Time (ACUTE ONLY): 54 SLP Stop Time (ACUTE ONLY): 0930 SLP Time Calculation (min) (ACUTE ONLY): 15 min  Past Medical History:  Past Medical History:  Diagnosis Date  . Abnormal antibody titer   . Anxiety and depression   . Arthritis   . Asthma   . Cystocele   . Depression   . Diabetes ([place])   . DVT (deep venous thrombosis) (H[facility])    Righ calf  . Dyspnea    11/08/2018 - "more now due to lack of exercise"  . Dysrhythmia    afib  . Edema   . GERD (gastroesophageal reflux disease)   . Heart murmur    Echocardiogram June 2015: Mild MR (possible vegetation seen on TTE, not seen on TEE), normal LV size with moderate concentric LVH. Normal function EF 60-65%. Normal diastolic function. Mild LA dilation.  Marland Kitchen History of IBS   . History of kidney stones   . History of methicillin resistant staphylococcus aureus (MRSA) 2008  . Hyperlipidemia   . Hypertension   . Hypothyroidism   . Insomnia   . Kidney stone    kidney stones with lithotripsy .  [facility]- "adrenal glands"  . Neuropathy involving both lower extremities   . Obesity, Class II, BMI 35-39.9, with comorbidity   . Paroxysmal atrial fibrillation ([place]) 2007   In June 2015, Cardiac Event Monitor: Mostly SR/sinus arrhythmia with PVCs that are frequent. Short bursts of A. fib lasting several minutes;; CHA2DS2-VASc Score = 5 (age, Female, PVD, DM, HTN)  . Peripheral vascular disease ([place])   . Sleep apnea    use C-PAP  . Urinary incontinence   . Venous stasis dermatitis of both lower extremities    Past Surgical History:  Past Surgical History:  Procedure Laterality Date  . ANKLE SURGERY Right   . APPLICATION OF WOUND VAC Left 06/27/2015   Procedure: APPLICATION OF WOUND VAC ( POSSIBLE ) ;  Surgeon: [name], MD;  Location: [facility];  Service: Vascular;   Laterality: Left;  . APPLICATION OF WOUND VAC Left 09/25/2016   Procedure: APPLICATION OF WOUND VAC;  Surgeon: [name], DPM;  Location: [facility];  Service: Podiatry;  Laterality: Left;  . arm surgery     right    . CHOLECYSTECTOMY    . COLONOSCOPY    . COLONOSCOPY WITH PROPOFOL N/A 01/11/2017   Procedure: COLONOSCOPY WITH PROPOFOL;  Surgeon: [name], MD;  Location: [facility] ENDOSCOPY;  Service: Endoscopy;  Laterality: N/A;  . COLONOSCOPY WITH PROPOFOL N/A 02/22/2017   Procedure: COLONOSCOPY WITH PROPOFOL;  Surgeon: [name], MD;  Location: [facility] ENDOSCOPY;  Service: Endoscopy;  Laterality: N/A;  . COLONOSCOPY WITH PROPOFOL N/A 05/31/2017   Procedure: COLONOSCOPY WITH PROPOFOL;  Surgeon: [name], MD;  Location: [facility] ENDOSCOPY;  Service: Endoscopy;  Laterality: N/A;  . ESOPHAGOGASTRODUODENOSCOPY (EGD) WITH PROPOFOL N/A 01/11/2017   Procedure: ESOPHAGOGASTRODUODENOSCOPY (EGD) WITH PROPOFOL;  Surgeon: [name], MD;  Location: [facility] ENDOSCOPY;  Service: Endoscopy;  Laterality: N/A;  . ESOPHAGOGASTRODUODENOSCOPY (EGD) WITH PROPOFOL N/A 10/25/2018   Procedure: ESOPHAGOGASTRODUODENOSCOPY (EGD) WITH PROPOFOL;  Surgeon: [name], MD;  Location: [facility] ENDOSCOPY;  Service: Endoscopy;  Laterality: N/A;  . ESOPHAGOGASTRODUODENOSCOPY (EGD) WITH PROPOFOL N/A 11/29/2019   Procedure: ESOPHAGOGASTRODUODENOSCOPY (EGD) WITH PROPOFOL;  Surgeon: Toledo, Benay Pike, MD;  Location: [facility] ENDOSCOPY;  Service: Gastroenterology;  Laterality: N/A;  .  HERNIA REPAIR     umbilical  . HIATAL HERNIA REPAIR    . I & D EXTREMITY Left 06/27/2015   Procedure: IRRIGATION AND DEBRIDEMENT EXTREMITY            ( CALF HEMATOMA ) POSSIBLE WOUND VAC;  Surgeon: [name], MD;  Location: [facility];  Service: Vascular;  Laterality: Left;  . INCISION AND DRAINAGE OF WOUND Left 09/25/2016   Procedure: IRRIGATION AND DEBRIDEMENT - PARTIAL RESECTION OF ACHILLES TENDON WITH WOUND VAC APPLICATION;   Surgeon: [name], DPM;  Location: [facility];  Service: Podiatry;  Laterality: Left;  . INCISION AND DRAINAGE OF WOUND Left 11/09/2018   Procedure: Excision of left foot wound with ACell placement;  Surgeon: Wallace Going, DO;  Location: [place];  Service: Plastics;  Laterality: Left;  . IRRIGATION AND DEBRIDEMENT ABSCESS Left 09/07/2016   Procedure: IRRIGATION AND DEBRIDEMENT ABSCESS LEFT HEEL;  Surgeon: [name], DPM;  Location: [facility];  Service: Podiatry;  Laterality: Left;  . JOINT REPLACEMENT  2013   left knee replacement  . KIDNEY SURGERY Right    kidney stones  . LITHOTRIPSY    . LOWER EXTREMITY ANGIOGRAPHY Left 04/04/2018   Procedure: LOWER EXTREMITY ANGIOGRAPHY;  Surgeon: [name], MD;  Location: [place] [facility];  Service: Cardiovascular;  Laterality: Left;  . NM GATED MYOVIEW ([place] [facility])  February 2017   Likely breast attenuation. LOW RISK study. Normal EF 55-60%.  . OTHER SURGICAL HISTORY     08/2016 or 09/2016 surgery on achilles tenso h/o staph infection heal. Dr. [name]  . removal of left hematoma Left    leg  . REVERSE SHOULDER ARTHROPLASTY Right 10/08/2015   Procedure: REVERSE SHOULDER ARTHROPLASTY;  Surgeon: [name], MD;  Location: [facility];  Service: Orthopedics;  Laterality: Right;  . SLEEVE GASTROPLASTY    . TONSILLECTOMY    . TOTAL KNEE ARTHROPLASTY Left   . TOTAL SHOULDER REPLACEMENT Right   . TRANSESOPHAGEAL ECHOCARDIOGRAM  08/08/2013   Mild LVH, EF 60-65%. Moderate LA dilation and mild RA dilation. Mild MR with no evidence of stenosis and no evidence of endocarditis. A false chordae is noted.  . TRANSTHORACIC ECHOCARDIOGRAM  08/03/2013   Mild-Moderate concentric LVH, EF 60-65%. Normal diastolic function. Mild LA dilation. Mild MR with possible vegetation  - not confirmed on TEE   . ULNAR NERVE TRANSPOSITION Right 08/06/2016   Procedure: ULNAR NERVE DECOMPRESSION/TRANSPOSITION;  Surgeon: [name], MD;  Location: [facility];  Service:  Orthopedics;  Laterality: Right;  . UPPER GI ENDOSCOPY    . VAGINAL HYSTERECTOMY     HPI:  [name]  is a 69 y.o. adult Caucasian female with a known history of multiple medical problems including type 2 diabetes mellitus, hypertension, hypothyroidism, depression and asthma, presented to the emergency room with acute onset of altered mental status with confusion as well as generalized weakness more on the left than the right side in the upper extremity. She has been having paresthesias with her symptoms.  She admitted to expressive dysphasia without dysphagia. Head CT scan revealed stable mild generalized cerebral atrophy and moderate chronic small vessel ischemic disease with chronic lacunar infarcts in the left cerebellar white matter in both basal ganglia.  Brain MRI revealed several small acute infarcts of both cerebral hemispheres the largest being in the posterior right centrum semiovale that is about 10 mm but no hemorrhage or mass-effect, concerning for acute embolic event.  It showed underlying advanced chronic small vessel  disease including occasional small chronic cortical infarcts and severe all chronic microhemorrhages in both deep gray nuclei.  Brain MRA revealed was negative for large vessel occlusion. Working dx is Acute bilateral likely embolic ischemic infarctions in the setting of paroxysmal atrial fibrillation with subsequent generalized weakness and altered mental status with confusion and left-sided hemiparesis.     Assessment / Plan / Recommendation Clinical Impression  Pt has extensive history of dysphagia. Most recently pt had EGD on 11/29/2019 revealed surgical changes consistent with sleeve gastrectomy, hiatal hernia, mild esophagitis and gastric polyp. Dysphagia, hx of esophageal dysmotility, presbyesophagus, GERD. On 12/08/2019 pt placed on 10 days of Fluconazole. During this assessment pt states that she has finished the course of fluconazole. Pt demonstrates grossly  adequate oropharyngeal abilities when consuming thin liquids via straw, puree and solids. She also states that she no longer experiences any pain with swallowing. At this time, pt is appropriate to for upgrade to regular diet with thin liquids via straw, medicine whole with thin liquids and strict reflux precautions.  SLP Visit Diagnosis: Dysphagia, unspecified (R13.10)    Aspiration Risk  No limitations    Diet Recommendation Regular;Thin liquid   Liquid Administration via: Straw Medication Administration: Whole meds with liquid Supervision: Staff to assist with self feeding Postural Changes: Seated upright at 90 degrees;Remain upright for at least 30 minutes after po intake (STRICT REFLUX PRECAUTIONS)    Other  Recommendations Oral Care Recommendations: Oral care BID   Follow up Recommendations None (for dyaphgai)      Frequency and Duration            Prognosis        Swallow Study   General Date of Onset: 12/23/19 HPI: [name]  is a 69 y.o. adult Caucasian female with a known history of multiple medical problems including type 2 diabetes mellitus, hypertension, hypothyroidism, depression and asthma, presented to the emergency room with acute onset of altered mental status with confusion as well as generalized weakness more on the left than the right side in the upper extremity. She has been having paresthesias with her symptoms.  She admitted to expressive dysphasia without dysphagia. Head CT scan revealed stable mild generalized cerebral atrophy and moderate chronic small vessel ischemic disease with chronic lacunar infarcts in the left cerebellar white matter in both basal ganglia.  Brain MRI revealed several small acute infarcts of both cerebral hemispheres the largest being in the posterior right centrum semiovale that is about 10 mm but no hemorrhage or mass-effect, concerning for acute embolic event.  It showed underlying advanced chronic small vessel disease including  occasional small chronic cortical infarcts and severe all chronic microhemorrhages in both deep gray nuclei.  Brain MRA revealed was negative for large vessel occlusion. Working dx is Acute bilateral likely embolic ischemic infarctions in the setting of paroxysmal atrial fibrillation with subsequent generalized weakness and altered mental status with confusion and left-sided hemiparesis.   Type of Study: Bedside Swallow Evaluation Previous Swallow Assessment: chronic dysphagia Diet Prior to this Study: NPO Temperature Spikes Noted: No Respiratory Status: Nasal cannula (2L) History of Recent Intubation: No Behavior/Cognition: Alert;Cooperative;Pleasant mood Oral Cavity Assessment: Within Functional Limits Oral Care Completed by SLP: No Oral Cavity - Dentition: Adequate natural dentition Vision: Functional for self-feeding Self-Feeding Abilities: Needs assist Patient Positioning: Upright in bed Baseline Vocal Quality: Normal Volitional Cough: Strong Volitional Swallow: Able to elicit    Oral/Motor/Sensory Function Overall Oral Motor/Sensory Function: Within functional limits   Ice Chips Ice chips: Not  tested   Thin Liquid Thin Liquid: Within functional limits Presentation: Self Fed;Straw    Nectar Thick Nectar Thick Liquid: Not tested   Honey Thick Honey Thick Liquid: Not tested   Puree Puree: Within functional limits Presentation: Spoon   Solid     Solid: Within functional limits Presentation: Self Fed     [name], M.S., CCC-SLP, [place] Office [phone]  Juanmiguel Defelice 12/23/2019,1:25 PM

## 2019-12-23 NOTE — Evaluation (Signed)
Speech Language Pathology Evaluation Patient Details Name: [name] MRN: [id number] DOB: 1950/07/08 Today's Date: 12/23/2019 Time: 2353-6144 SLP Time Calculation (min) (ACUTE ONLY): 15 min  Problem List:  Patient Active Problem List   Diagnosis Date Noted  . Acute embolic stroke ([place]) 31/54/0086  . Chronic bacteriuria 11/17/2019  . Hyperkalemia 09/14/2019  . Bacteremia due to Gram-positive bacteria 09/08/2019  . Sepsis ([place]) 09/08/2019  . Toxic encephalopathy 09/08/2019  . Pressure injury of skin 09/04/2019  . Elevated troponin 09/02/2019  . COPD with acute exacerbation ([place]) 09/02/2019  . Acute respiratory failure with hypoxia ([place]) 09/02/2019  . Pharmacologic therapy 08/30/2019  . Recurrent falls 07/10/2019  . Left ankle pain 06/02/2019  . Chronic pain of right ankle 02/14/2019  . Arthritis 02/14/2019  . Nausea and vomiting 02/14/2019  . Meralgia paresthetica of right side 12/05/2018  . Chronic anticoagulation ([place]) 12/05/2018  . Peripheral vascular disease ([place]) 10/18/2018  . Chronic ulcer of right leg ([place]) 10/18/2018  . Fibromyalgia 08/17/2018  . Chronic musculoskeletal pain 08/17/2018  . Tremor of right hand 08/03/2018  . Overactive bladder 08/03/2018  . Physical deconditioning 08/03/2018  . Foot ulcer, left ([place]) 03/09/2018  . Adrenal mass ([place]) 08/02/2017  . Eczema 06/27/2017  . Diabetic ulcer of left foot ([place]) 06/24/2017  . Mass of both adrenal glands ([place]) 05/16/2017  . Fatty liver 05/13/2017  . Strain of right hip 04/30/2017  . Ischial pain, right 04/30/2017  . Pain of left hip joint 04/22/2017  . Abnormality of gait and mobility 04/22/2017  . Muscle strain of left hip 04/09/2017  . Anemia 11/18/2016  . Asthma 11/18/2016  . Carpal tunnel syndrome 11/18/2016  . Pain in joint involving ankle and foot 11/18/2016  . Obstructive sleep apnea of adult 11/18/2016  . Spinal stenosis of thoracic region 11/18/2016  . Abscess of tendon of left foot  09/21/2016  . Cellulitis 09/05/2016  . Cubital tunnel syndrome on right 08/07/2016  . Chronic pain syndrome 02/25/2016  . Long term current use of opiate analgesic 02/25/2016  . Chronic right shoulder pain 02/25/2016  . CAP (community acquired pneumonia) 10/11/2015  . Status post reverse total shoulder replacement 10/08/2015  . 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 Morbid obesity with BMI of 40.0-44.9, adult ([place]) 09/23/2015  . Chronic prescription benzodiazepine use 09/23/2015  . Arthralgia 09/23/2015  . Osteoarthritis involving multiple joints 09/23/2015  . Other shoulder lesions (Right) 08/28/2015  . Cervical radiculitis 08/06/2015  . Fall 05/22/2015  . Opiate use (15 MME/Day) 01/16/2015  . Impingement syndrome of shoulder region 01/07/2015  . Mixed incontinence 11/26/2014  . Atrophic vaginitis 11/26/2014  . Anxiety and depression 09/21/2014  . Bladder cystocele 09/21/2014  . Gastro-esophageal reflux disease without esophagitis 09/21/2014  . DM (diabetes mellitus) type II, controlled, with peripheral vascular disorder ([place]) 08/31/2014  . Diabetic peripheral neuropathy associated with type 2 diabetes mellitus ([place]) 08/31/2014  . Asthma, well controlled 08/31/2014  . Hypothyroidism, unspecified 08/31/2014  . Peripheral vascular disease due to secondary diabetes mellitus ([place]) 12/11/2013  . Sleep apnea 10/19/2013  . Paroxysmal atrial fibrillation ([place]); CHA2DS2-Vasc Score 5. On Pradaxa 07/27/2013  . Essential hypertension 07/27/2013  . Chronic atrial fibrillation 08/10/2012   Past Medical History:  Past Medical History:  Diagnosis Date  . Abnormal antibody titer   . Anxiety and depression   . Arthritis   . Asthma   . Cystocele   . Depression   . Diabetes ([place])   . DVT (deep venous thrombosis) (  HCC)    Righ calf  . Dyspnea    11/08/2018 - "more now due to lack of exercise"  . Dysrhythmia    afib  . Edema   . GERD (gastroesophageal reflux  disease)   . Heart murmur    Echocardiogram June 2015: Mild MR (possible vegetation seen on TTE, not seen on TEE), normal LV size with moderate concentric LVH. Normal function EF 60-65%. Normal diastolic function. Mild LA dilation.  Marland Kitchen History of IBS   . History of kidney stones   . History of methicillin resistant staphylococcus aureus (MRSA) 2008  . Hyperlipidemia   . Hypertension   . Hypothyroidism   . Insomnia   . Kidney stone    kidney stones with lithotripsy .  [facility]- "adrenal glands"  . Neuropathy involving both lower extremities   . Obesity, Class II, BMI 35-39.9, with comorbidity   . Paroxysmal atrial fibrillation ([place]) 2007   In June 2015, Cardiac Event Monitor: Mostly SR/sinus arrhythmia with PVCs that are frequent. Short bursts of A. fib lasting several minutes;; CHA2DS2-VASc Score = 5 (age, Female, PVD, DM, HTN)  . Peripheral vascular disease ([place])   . Sleep apnea    use C-PAP  . Urinary incontinence   . Venous stasis dermatitis of both lower extremities    Past Surgical History:  Past Surgical History:  Procedure Laterality Date  . ANKLE SURGERY Right   . APPLICATION OF WOUND VAC Left 06/27/2015   Procedure: APPLICATION OF WOUND VAC ( POSSIBLE ) ;  Surgeon: [name], MD;  Location: [facility] ORS;  Service: Vascular;  Laterality: Left;  . APPLICATION OF WOUND VAC Left 09/25/2016   Procedure: APPLICATION OF WOUND VAC;  Surgeon: [name], DPM;  Location: [facility] ORS;  Service: Podiatry;  Laterality: Left;  . arm surgery     right    . CHOLECYSTECTOMY    . COLONOSCOPY    . COLONOSCOPY WITH PROPOFOL N/A 01/11/2017   Procedure: COLONOSCOPY WITH PROPOFOL;  Surgeon: [name], MD;  Location: [facility] ENDOSCOPY;  Service: Endoscopy;  Laterality: N/A;  . COLONOSCOPY WITH PROPOFOL N/A 02/22/2017   Procedure: COLONOSCOPY WITH PROPOFOL;  Surgeon: [name], MD;  Location: [facility];  Service: Endoscopy;  Laterality: N/A;  . COLONOSCOPY WITH PROPOFOL  N/A 05/31/2017   Procedure: COLONOSCOPY WITH PROPOFOL;  Surgeon: [name], MD;  Location: [facility] ENDOSCOPY;  Service: Endoscopy;  Laterality: N/A;  . ESOPHAGOGASTRODUODENOSCOPY (EGD) WITH PROPOFOL N/A 01/11/2017   Procedure: ESOPHAGOGASTRODUODENOSCOPY (EGD) WITH PROPOFOL;  Surgeon: [name], MD;  Location: [facility] ENDOSCOPY;  Service: Endoscopy;  Laterality: N/A;  . ESOPHAGOGASTRODUODENOSCOPY (EGD) WITH PROPOFOL N/A 10/25/2018   Procedure: ESOPHAGOGASTRODUODENOSCOPY (EGD) WITH PROPOFOL;  Surgeon: [name], MD;  Location: [facility] ENDOSCOPY;  Service: Endoscopy;  Laterality: N/A;  . ESOPHAGOGASTRODUODENOSCOPY (EGD) WITH PROPOFOL N/A 11/29/2019   Procedure: ESOPHAGOGASTRODUODENOSCOPY (EGD) WITH PROPOFOL;  Surgeon: [place], Benay Pike, MD;  Location: [facility] ENDOSCOPY;  Service: Gastroenterology;  Laterality: N/A;  . HERNIA REPAIR     umbilical  . HIATAL HERNIA REPAIR    . I & D EXTREMITY Left 06/27/2015   Procedure: IRRIGATION AND DEBRIDEMENT EXTREMITY            ( CALF HEMATOMA ) POSSIBLE WOUND VAC;  Surgeon: [name], MD;  Location: [facility] ORS;  Service: Vascular;  Laterality: Left;  . INCISION AND DRAINAGE OF WOUND Left 09/25/2016   Procedure: IRRIGATION AND DEBRIDEMENT - PARTIAL RESECTION OF ACHILLES TENDON WITH WOUND VAC APPLICATION;  Surgeon: [name], DPM;  Location: [facility] ORS;  Service: Podiatry;  Laterality: Left;  . INCISION AND DRAINAGE OF WOUND Left 11/09/2018   Procedure: Excision of left foot wound with ACell placement;  Surgeon: Wallace Going, DO;  Location: [place];  Service: Plastics;  Laterality: Left;  . IRRIGATION AND DEBRIDEMENT ABSCESS Left 09/07/2016   Procedure: IRRIGATION AND DEBRIDEMENT ABSCESS LEFT HEEL;  Surgeon: [name], DPM;  Location: [facility] ORS;  Service: Podiatry;  Laterality: Left;  . JOINT REPLACEMENT  2013   left knee replacement  . KIDNEY SURGERY Right    kidney stones  . LITHOTRIPSY    . LOWER EXTREMITY ANGIOGRAPHY Left 04/04/2018    Procedure: LOWER EXTREMITY ANGIOGRAPHY;  Surgeon: [name], MD;  Location: [place] CV LAB;  Service: Cardiovascular;  Laterality: Left;  . NM GATED MYOVIEW ([place] [facility])  February 2017   Likely breast attenuation. LOW RISK study. Normal EF 55-60%.  . OTHER SURGICAL HISTORY     08/2016 or 09/2016 surgery on achilles tenso h/o staph infection heal. Dr. [name]  . removal of left hematoma Left    leg  . REVERSE SHOULDER ARTHROPLASTY Right 10/08/2015   Procedure: REVERSE SHOULDER ARTHROPLASTY;  Surgeon: [name], MD;  Location: [facility] ORS;  Service: Orthopedics;  Laterality: Right;  . SLEEVE GASTROPLASTY    . TONSILLECTOMY    . TOTAL KNEE ARTHROPLASTY Left   . TOTAL SHOULDER REPLACEMENT Right   . TRANSESOPHAGEAL ECHOCARDIOGRAM  08/08/2013   Mild LVH, EF 60-65%. Moderate LA dilation and mild RA dilation. Mild MR with no evidence of stenosis and no evidence of endocarditis. A false chordae is noted.  . TRANSTHORACIC ECHOCARDIOGRAM  08/03/2013   Mild-Moderate concentric LVH, EF 60-65%. Normal diastolic function. Mild LA dilation. Mild MR with possible vegetation  - not confirmed on TEE   . ULNAR NERVE TRANSPOSITION Right 08/06/2016   Procedure: ULNAR NERVE DECOMPRESSION/TRANSPOSITION;  Surgeon: [name], MD;  Location: [facility] ORS;  Service: Orthopedics;  Laterality: Right;  . UPPER GI ENDOSCOPY    . VAGINAL HYSTERECTOMY     HPI:  [name]  is a 69 y.o. adult Caucasian female with a known history of multiple medical problems including type 2 diabetes mellitus, hypertension, hypothyroidism, depression and asthma, presented to the emergency room with acute onset of altered mental status with confusion as well as generalized weakness more on the left than the right side in the upper extremity. She has been having paresthesias with her symptoms.  She admitted to expressive dysphasia without dysphagia. Head CT scan revealed stable mild generalized cerebral atrophy and moderate chronic small  vessel ischemic disease with chronic lacunar infarcts in the left cerebellar white matter in both basal ganglia.  Brain MRI revealed several small acute infarcts of both cerebral hemispheres the largest being in the posterior right centrum semiovale that is about 10 mm but no hemorrhage or mass-effect, concerning for acute embolic event.  It showed underlying advanced chronic small vessel disease including occasional small chronic cortical infarcts and severe all chronic microhemorrhages in both deep gray nuclei.  Brain MRA revealed was negative for large vessel occlusion. Working dx is Acute bilateral likely embolic ischemic infarctions in the setting of paroxysmal atrial fibrillation with subsequent generalized weakness and altered mental status with confusion and left-sided hemiparesis.     Assessment / Plan / Recommendation Clinical Impression  Pt presents with word finding difficulties that results in extremely delayed responses, word substitutions and overall frustration and awareness of difficulty.  Pt able to correctly name objects within her room but is not able to list more than 4 animals and her sentence formulation is very disjointed and lengthy d/t word finding deficits. Question cues appear effective in helping her communicate wants and needs. Skilled ST services are indicated to target these deficits and therefore recommend SNF at discharge.     SLP Assessment  SLP Recommendation/Assessment: All further Speech Lanaguage Pathology  needs can be addressed in the next venue of care SLP Visit Diagnosis: Aphasia (R47.01)    Follow Up Recommendations  Skilled Nursing facility    Frequency and Duration           SLP Evaluation Cognition  Overall Cognitive Status: Impaired/Different from baseline Arousal/Alertness: Awake/alert Orientation Level: Oriented to person (oriented to October but required cues for day, year ) Attention: Sustained Sustained Attention: Appears intact Awareness:  Appears intact       Comprehension  Auditory Comprehension Overall Auditory Comprehension: Appears within functional limits for tasks assessed Visual Recognition/Discrimination Discrimination: Not tested Reading Comprehension Reading Status: Not tested    Expression Expression Primary Mode of Expression: Verbal Verbal Expression Overall Verbal Expression: Impaired Initiation: Impaired Automatic Speech: Name Level of Generative/Spontaneous Verbalization: Phrase Repetition: No impairment Naming: Impairment Responsive: 26-50% accurate Confrontation: Within functional limits Convergent: 0-24% accurate Divergent: 0-24% accurate Verbal Errors: Aware of errors Pragmatics: Impairment Impairments: Abnormal affect Effective Techniques: Open ended questions;Sentence completion Non-Verbal Means of Communication: Not applicable Written Expression Dominant Hand: Right Written Expression: Not tested   Oral / Motor  Oral Motor/Sensory Function Overall Oral Motor/Sensory Function: Within functional limits Motor Speech Overall Motor Speech: Appears within functional limits for tasks assessed Respiration: Within functional limits Phonation: Normal Resonance: Within functional limits Articulation: Within functional limitis Intelligibility: Intelligible Motor Planning: Witnin functional limits Motor Speech Errors: Not applicable   GO                   Elaina Cara B. Rutherford Nail M.S., CCC-SLP, [place] [phone]  Brodi Nery Rutherford Nail 12/23/2019, 1:14 PM

## 2019-12-23 NOTE — H&P (Signed)
[facility]   PATIENT NAME: [name]    MR#:  [id number]  DATE OF BIRTH:  1950-10-08  DATE OF ADMISSION:  12/21/2019  PRIMARY CARE PHYSICIAN: [name], [name], MD   REQUESTING/REFERRING PHYSICIAN: Brenton Grills, MD  CHIEF COMPLAINT:   Chief Complaint  Patient presents with  . Weakness    HISTORY OF PRESENT ILLNESS:  [name]  is a 69 y.o. adult Caucasian female with a known history of multiple medical problems including type 2 diabetes mellitus, hypertension, hypothyroidism, depression and asthma, presented to the emergency room with acute onset of altered mental status with confusion as well as generalized weakness more on the left than the right side in th up extremity.  She he admitted to recurrent dizziness.  No chest pain or dyspnea or palpitations.  She has been having paresthesias with her symptoms.  She admitted to expressive dysphasia without dysphagia.  No nausea vomiting or abdominal pain.  No bleeding diathesis.  Upon presentation to the emergency room, blood pressure was 142/81 with otherwise normal vital signs.  Labs revealed a BUN of 23 and creatinine 1.3 and yesterday came down to 0.93 with lactic acid of 2 and anemia on 9/28.  High-sensitivity troponin I was 8 and later 7.  Influenza antigens came back negative and COVID-19 PCR came back negative.  Left humerus CT scan revealed moderate severe osteoarthritis of the glenohumeral joint with small effusion and no acute osseous abnormality of the left humerus.  Head CT scan revealed stable mild generalized cerebral atrophy and moderate chronic small vessel ischemic disease with chronic lacunar infarcts in the left cerebellar white matter in both basal ganglia.  Brain MRI revealed several small acute infarcts of both cerebral hemispheres the largest being in the posterior right centrum semiovale that is about 10 mm but no hemorrhage or mass-effect, concerning for acute embolic event.  It showed  underlying advanced chronic small vessel disease including occasional small chronic cortical infarcts and severe all chronic microhemorrhages in both deep gray nuclei.  Brain MRA revealed was negative for large vessel occlusion.  EKG showed junctional rhythm with right axis deviation on 8/28.  The patient will be admitted to a progressive unit bed for further evaluation and management.  PAST MEDICAL HISTORY:   Past Medical History:  Diagnosis Date  . Abnormal antibody titer   . Anxiety and depression   . Arthritis   . Asthma   . Cystocele   . Depression   . Diabetes ([place])   . DVT (deep venous thrombosis) (HCC)    Righ calf  . Dyspnea    11/08/2018 - "more now due to lack of exercise"  . Dysrhythmia    afib  . Edema   . GERD (gastroesophageal reflux disease)   . Heart murmur    Echocardiogram June 2015: Mild MR (possible vegetation seen on TTE, not seen on TEE), normal LV size with moderate concentric LVH. Normal function EF 60-65%. Normal diastolic function. Mild LA dilation.  Marland Kitchen History of IBS   . History of kidney stones   . History of methicillin resistant staphylococcus aureus (MRSA) 2008  . Hyperlipidemia   . Hypertension   . Hypothyroidism   . Insomnia   . Kidney stone    kidney stones with lithotripsy .  [facility]- "adrenal glands"  . Neuropathy involving both lower extremities   . Obesity, Class II, BMI 35-39.9, with comorbidity   . Paroxysmal atrial fibrillation ([place]) 2007   In June 2015,  Cardiac Event Monitor: Mostly SR/sinus arrhythmia with PVCs that are frequent. Short bursts of A. fib lasting several minutes;; CHA2DS2-VASc Score = 5 (age, Female, PVD, DM, HTN)  . Peripheral vascular disease ([place])   . Sleep apnea    use C-PAP  . Urinary incontinence   . Venous stasis dermatitis of both lower extremities     PAST SURGICAL HISTORY:   Past Surgical History:  Procedure Laterality Date  . ANKLE SURGERY Right   . APPLICATION OF WOUND VAC Left 06/27/2015    Procedure: APPLICATION OF WOUND VAC ( POSSIBLE ) ;  Surgeon: [name], MD;  Location: [facility] ORS;  Service: Vascular;  Laterality: Left;  . APPLICATION OF WOUND VAC Left 09/25/2016   Procedure: APPLICATION OF WOUND VAC;  Surgeon: [name], DPM;  Location: [facility] ORS;  Service: Podiatry;  Laterality: Left;  . arm surgery     right    . CHOLECYSTECTOMY    . COLONOSCOPY    . COLONOSCOPY WITH PROPOFOL N/A 01/11/2017   Procedure: COLONOSCOPY WITH PROPOFOL;  Surgeon: [name], MD;  Location: [facility] END[facility]COPY;  Service: Endoscopy;  Laterality: N/A;  . COLONOSCOPY WITH PROPOFOL N/A 02/22/2017   Procedure: COLONOSCOPY WITH PROPOFOL;  Surgeon: [name], MD;  Location: [facility] ENDOSCOPY;  Service: Endoscopy;  Laterality: N/A;  . COLONOSCOPY WITH PROPOFOL N/A 05/31/2017   Procedure: COLONOSCOPY WITH PROPOFOL;  Surgeon: [name], MD;  Location: [facility] ENDOSCOPY;  Service: Endoscopy;  Laterality: N/A;  . ESOPHAGOGASTRODUODENOSCOPY (EGD) WITH PROPOFOL N/A 01/11/2017   Procedure: ESOPHAGOGASTRODUODENOSCOPY (EGD) WITH PROPOFOL;  Surgeon: [name], MD;  Location: [facility] ENDOSCOPY;  Service: Endoscopy;  Laterality: N/A;  . ESOPHAGOGASTRODUODENOSCOPY (EGD) WITH PROPOFOL N/A 10/25/2018   Procedure: ESOPHAGOGASTRODUODENOSCOPY (EGD) WITH PROPOFOL;  Surgeon: [name], MD;  Location: [facility] ENDOSCOPY;  Service: Endoscopy;  Laterality: N/A;  . ESOPHAGOGASTRODUODENOSCOPY (EGD) WITH PROPOFOL N/A 11/29/2019   Procedure: ESOPHAGOGASTRODUODENOSCOPY (EGD) WITH PROPOFOL;  Surgeon: Toledo, Benay Pike, MD;  Location: [facility] ENDOSCOPY;  Service: Gastroenterology;  Laterality: N/A;  . HERNIA REPAIR     umbilical  . HIATAL HERNIA REPAIR    . I & D EXTREMITY Left 06/27/2015   Procedure: IRRIGATION AND DEBRIDEMENT EXTREMITY            ( CALF HEMATOMA ) POSSIBLE WOUND VAC;  Surgeon: [name], MD;  Location: [facility] ORS;  Service: Vascular;  Laterality: Left;  . INCISION AND DRAINAGE OF WOUND Left  09/25/2016   Procedure: IRRIGATION AND DEBRIDEMENT - PARTIAL RESECTION OF ACHILLES TENDON WITH WOUND VAC APPLICATION;  Surgeon: [name], DPM;  Location: [facility] ORS;  Service: Podiatry;  Laterality: Left;  . INCISION AND DRAINAGE OF WOUND Left 11/09/2018   Procedure: Excision of left foot wound with ACell placement;  Surgeon: Wallace Going, DO;  Location: [place];  Service: Plastics;  Laterality: Left;  . IRRIGATION AND DEBRIDEMENT ABSCESS Left 09/07/2016   Procedure: IRRIGATION AND DEBRIDEMENT ABSCESS LEFT HEEL;  Surgeon: [name], DPM;  Location: [facility];  Service: Podiatry;  Laterality: Left;  . JOINT REPLACEMENT  2013   left knee replacement  . KIDNEY SURGERY Right    kidney stones  . LITHOTRIPSY    . LOWER EXTREMITY ANGIOGRAPHY Left 04/04/2018   Procedure: LOWER EXTREMITY ANGIOGRAPHY;  Surgeon: [name], MD;  Location: [place] CV LAB;  Service: Cardiovascular;  Laterality: Left;  . NM GATED MYOVIEW ([place] HX)  February 2017   Likely breast attenuation. LOW RISK study. Normal EF 55-60%.  Marland Kitchen  OTHER SURGICAL HISTORY     08/2016 or 09/2016 surgery on achilles tenso h/o staph infection heal. Dr. [name]  . removal of left hematoma Left    leg  . REVERSE SHOULDER ARTHROPLASTY Right 10/08/2015   Procedure: REVERSE SHOULDER ARTHROPLASTY;  Surgeon: [name], MD;  Location: [facility] ORS;  Service: Orthopedics;  Laterality: Right;  . SLEEVE GASTROPLASTY    . TONSILLECTOMY    . TOTAL KNEE ARTHROPLASTY Left   . TOTAL SHOULDER REPLACEMENT Right   . TRANSESOPHAGEAL ECHOCARDIOGRAM  08/08/2013   Mild LVH, EF 60-65%. Moderate LA dilation and mild RA dilation. Mild MR with no evidence of stenosis and no evidence of endocarditis. A false chordae is noted.  . TRANSTHORACIC ECHOCARDIOGRAM  08/03/2013   Mild-Moderate concentric LVH, EF 60-65%. Normal diastolic function. Mild LA dilation. Mild MR with possible vegetation  - not confirmed on TEE   . ULNAR NERVE TRANSPOSITION Right  08/06/2016   Procedure: ULNAR NERVE DECOMPRESSION/TRANSPOSITION;  Surgeon: [name], MD;  Location: [facility] ORS;  Service: Orthopedics;  Laterality: Right;  . UPPER GI ENDOSCOPY    . VAGINAL HYSTERECTOMY      SOCIAL HISTORY:   Social History   Tobacco Use  . Smoking status: Never Smoker  . Smokeless tobacco: Never Used  Substance Use Topics  . Alcohol use: No    Alcohol/week: 0.0 standard drinks    FAMILY HISTORY:   Family History  Problem Relation Age of Onset  . Skin cancer Father   . Diabetes Father   . Hypertension Father   . Peripheral vascular disease Father   . Cancer Father   . Cerebral aneurysm Father   . Alcohol abuse Father   . Varicose Veins Mother   . Kidney disease Mother   . Arthritis Mother   . Mental illness Sister   . Cancer Maternal Aunt        breast  . Breast cancer Maternal Aunt   . Arthritis Maternal Grandmother   . Hypertension Maternal Grandmother   . Diabetes Maternal Grandmother   . Arthritis Maternal Grandfather   . Heart disease Maternal Grandfather   . Hypertension Maternal Grandfather   . Arthritis Paternal Grandmother   . Hypertension Paternal Grandmother   . Diabetes Paternal Grandmother   . Arthritis Paternal Grandfather   . Hypertension Paternal Grandfather   . Bladder Cancer Neg Hx   . Kidney cancer Neg Hx     DRUG ALLERGIES:   Allergies  Allergen Reactions  . Morphine And Related Other (See Comments) and Nausea Only    Patient becomes very confused Other reaction(s): Other (See Comments), Other (See Comments) Patient becomes very confused Patient becomes very confused   . Penicillins Hives, Shortness Of Breath, Swelling and Other (See Comments)    Facial swelling Has patient had a PCN reaction causing immediate rash, facial/tongue/throat swelling, SOB or lightheadedness with hypotension: Yes Has patient had a PCN reaction causing severe rash involving mucus membranes or skin necrosis: Yes Has patient had a PCN  reaction that required hospitalization No Has patient had a PCN reaction occurring within the last 10 years: No If all of the above answers are "NO", then may proceed with Cephalosporin use.  Other reaction(s): Other (See Comments), Other (See Comments) Facial swelling Has patient had a PCN reaction causing immediate rash, facial/tongue/throat swelling, SOB or lightheadedness with hypotension: Yes Has patient had a PCN reaction causing severe rash involving mucus membranes or skin necrosis: Yes Has patient had a PCN reaction that  required hospitalization No Has patient had a PCN reaction occurring within the last 10 years: No If all of the above answers are "NO", then may proceed with Cephalosporin use. Facial swelling Has patient had a PCN reaction causing immediate rash, facial/tongue/throat swelling, SOB or lightheadedness with hypotension: Yes Has patient had a PCN reaction causing severe rash involving mucus membranes or skin necrosis: Yes Has patient had a PCN reaction that required hospitalization No Has patient had a PCN reaction occurring within the last 10 years: No If all of the above answers are "NO", then may proceed with Cephalosporin use. Facial swelling Has patient had a PCN reaction causing immediate rash, facial/tongue/throat swelling, SOB or lightheadedness with hypotension: Yes Has patient had a PCN reaction causing severe rash involving mucus membranes or skin necrosis: Yes Has patient had a PCN reaction that required hospitalization No Has patient had a PCN reaction occurring within the last 10 years: No If all of the above answers are "NO", then may proceed with Cephalosporin use.   . Ambien [Zolpidem]     Hallucination   . Aspirin Hives  . Oxytetracycline Hives    Other reaction(s): Unknown     REVIEW OF SYSTEMS:   ROS As per history of present illness. All pertinent systems were reviewed above. Constitutional, HEENT, cardiovascular, respiratory, GI, GU,  musculoskeletal, neuro, psychiatric, endocrine, integumentary and hematologic systems were reviewed and are otherwise negative/unremarkable except for positive findings mentioned above in the HPI.   MEDICATIONS AT HOME:   Prior to Admission medications   Medication Sig Start Date End Date Taking? Authorizing Provider  acetaminophen (TYLENOL) 325 MG tablet Take 2 tablets (650 mg total) by mouth every 6 (six) hours as needed for mild pain or moderate pain (or Fever >/= 101). 09/08/19  Yes Guilford Shi, MD  albuterol (PROVENTIL) (2.5 MG/3ML) 0.083% nebulizer solution Take 2.5 mg by nebulization every 4 (four) hours as needed for wheezing or shortness of breath.   Yes [provider]  albuterol (VENTOLIN HFA) 108 (90 Base) MCG/ACT inhaler Inhale 2 puffs into the lungs every 6 (six) hours as needed for wheezing or shortness of breath.   Yes [provider]  ALPRAZolam Duanne Moron) 0.5 MG tablet Take 0.5 tablets (0.25 mg total) by mouth 2 (two) times daily as needed for anxiety. 10/27/19  Yes [name], [name], MD  amLODipine (NORVASC) 5 MG tablet Take 1 tablet (5 mg total) by mouth daily. 11/28/19 02/26/20 Yes Loel Dubonnet, NP  buPROPion (WELLBUTRIN XL) 150 MG 24 hr tablet Take 1 tablet (150 mg total) by mouth daily. Patient taking differently: Take 300 mg by mouth daily.  05/02/19  Yes [name], [name], MD  clobetasol cream (TEMOVATE) 6.38 % Apply 1 application topically 2 (two) times daily.   Yes [provider]  colchicine 0.6 MG tablet Take 0.6 mg by mouth 2 (two) times daily.   Yes [provider]  dabigatran (PRADAXA) 150 MG CAPS capsule Take 1 capsule (150 mg total) by mouth 2 (two) times daily. Please call to schedule office visit for further refills. Thank you! 10/05/19  Yes Wellington Hampshire, MD  DULoxetine (CYMBALTA) 60 MG capsule Take 60 mg by mouth daily.   Yes [provider]  ergocalciferol (VITAMIN D2) 1.25 MG (50000 UT) capsule  Take 50,000 Units by mouth once a week.   Yes [provider]  estradiol (ESTRACE) 0.1 MG/GM vaginal cream Place 1 Applicatorful vaginally every Monday, Wednesday, and Friday. Apply 0.5mg  (pea-sized amount)  just  inside the vaginal introitus with a finger-tip on Monday, Wednesday and Friday nights, 02/01/19  Yes McGowan, [name] A, PA-C  fluticasone-salmeterol (ADVAIR HFA) 115-21 MCG/ACT inhaler Inhale 2 puffs into the lungs daily. Rinse mouth thoroughly after use 08/16/19  Yes Tyler Pita, MD  HYDROcodone-acetaminophen (NORCO/VICODIN) 5-325 MG tablet Take 1 tablet by mouth every 8 (eight) hours as needed for severe pain. Must last 30 days 11/25/19 12/25/19 Yes Gillis Santa, MD  levocetirizine (XYZAL) 5 MG tablet Take 0.5 tablets (2.5 mg total) by mouth every evening. Patient taking differently: Take 5 mg by mouth every evening.  09/07/19  Yes [name], [name], MD  levothyroxine (SYNTHROID) 175 MCG tablet Take 1 tablet (175 mcg total) by mouth daily before breakfast. 03/10/19  Yes [name], [name], MD  losartan-hydrochlorothiazide (HYZAAR) 50-12.5 MG tablet Take 1 tablet by mouth daily.   Yes [provider]  metFORMIN (GLUCOPHAGE) 1000 MG tablet Take 1 tablet (1,000 mg total) by mouth 2 (two) times daily. With food Patient taking differently: Take 500-1,000 mg by mouth 2 (two) times daily. Take 1 tablet every morning and 1/2 tablet every night. 12/14/18  Yes [name], [name], MD  metoprolol succinate (TOPROL-XL) 50 MG 24 hr tablet TAKE 1 TABLET BY MOUTH DAILY 11/09/19  Yes Loel Dubonnet, NP  montelukast (SINGULAIR) 10 MG tablet Take 1 tablet (10 mg total) by mouth at bedtime. 02/03/19  Yes [name], [name], MD  ondansetron (ZOFRAN) 4 MG tablet Take 1 tablet (4 mg total) by mouth every 8 (eight) hours as needed for nausea or vomiting. 02/14/19  Yes [name], [name], MD  oxybutynin (DITROPAN XL) 10 MG 24 hr tablet Take 10 mg by mouth at  bedtime.   Yes [provider]  pantoprazole (PROTONIX) 40 MG tablet Take 40 mg by mouth daily.  04/07/19 04/06/20 Yes [provider]  pregabalin (LYRICA) 50 MG capsule Take 1 capsule (50 mg total) by mouth 3 (three) times daily. 11/22/19 05/20/20 Yes Gillis Santa, MD  sucralfate (CARAFATE) 1 GM/10ML suspension Take 1 g by mouth 3 (three) times daily.  12/06/19  Yes [provider]  TOVIAZ 8 MG TB24 tablet TAKE ONE TABLET BY MOUTH EVERY DAY 08/07/19  Yes McGowan, [name] A, PA-C  trospium (SANCTURA) 20 MG tablet Take 20 mg by mouth 2 (two) times daily.   Yes [provider]  HYDROcodone-acetaminophen (NORCO/VICODIN) 5-325 MG tablet Take 1 tablet by mouth every 8 (eight) hours as needed for severe pain. Must last 30 days 12/25/19 01/24/20  Gillis Santa, MD  HYDROcodone-acetaminophen (NORCO/VICODIN) 5-325 MG tablet Take 1 tablet by mouth every 8 (eight) hours as needed for severe pain. Must last 30 days 01/24/20 02/23/20  Gillis Santa, MD  meloxicam (MOBIC) 15 MG tablet Take 15 mg by mouth daily. Patient not taking: Reported on 12/21/2019    [provider]      VITAL SIGNS:  Blood pressure (!) 128/101, pulse 77, temperature 98.9 F (37.2 C), temperature source Oral, resp. rate 18, height 5\' 5"  (1.651 m), weight 115.4 kg, SpO2 94 %.  PHYSICAL EXAMINATION:  Physical Exam  GENERAL:  69 y.o.-year-old Caucasian female patient lying in the bed with no acute distress.  EYES: Pupils equal, round, reactive to light and accommodation. No scleral icterus. Extraocular muscles intact.  HEENT: Head atraumatic, normocephalic. Oropharynx and nasopharynx clear.  NECK:  Supple, no jugular venous distention. No thyroid enlargement, no tenderness.  LUNGS: Normal breath sounds bilaterally, no wheezing, rales,rhonchi or crepitation. No use of  accessory muscles of respiration.  CARDIOVASCULAR: Regular rate and rhythm, S1, S2 normal. No murmurs, rubs, or gallops.  ABDOMEN: Soft,  nondistended, nontender. Bowel sounds present. No organomegaly or mass.  EXTREMITIES: No pedal edema, cyanosis, or clubbing.  NEUROLOGIC: Cranial nerves II through XII are intact except for expressive dysphasia. Muscle strength was 3/5 in the left upper and lower extremities compared to 5/5 in the right upper and lower extremities.  Sensation intact. Gait not checked.  PSYCHIATRIC: The patient is alert and oriented x 3.  Normal affect and good eye contact. SKIN: No obvious rash, lesion, or ulcer.   LABORATORY PANEL:   CBC Recent Labs  Lab 12/19/19 2132  WBC 7.0  HGB 9.7*  HCT 31.6*  PLT 302   ------------------------------------------------------------------------------------------------------------------  Chemistries  Recent Labs  Lab 12/19/19 1700 12/19/19 1700 12/19/19 2132 12/22/19 0712  NA 136  --   --   --   K 4.7  --   --   --   CL 102  --   --   --   CO2 26  --   --   --   GLUCOSE 104*  --   --   --   BUN 23  --   --   --   CREATININE 1.31*   < >  --  0.93  CALCIUM 8.7*  --   --   --   AST  --   --  14*  --   ALT  --   --  10  --   ALKPHOS  --   --  84  --   BILITOT  --   --  0.5  --    < > = values in this interval not displayed.   ------------------------------------------------------------------------------------------------------------------  Cardiac Enzymes No results for input(s): TROPONINI in the last 168 hours. ------------------------------------------------------------------------------------------------------------------  RADIOLOGY:  CT Head Wo Contrast  Result Date: 12/21/2019 CLINICAL DATA:  Facial trauma; fall. EXAM: CT HEAD WITHOUT CONTRAST TECHNIQUE: Contiguous axial images were obtained from the base of the skull through the vertex without intravenous contrast. COMPARISON:  Head CT 07/28/2018.  Brain MRI 01/14/2005. FINDINGS: Brain: Stable, mild generalized cerebral atrophy. Stable moderate multifocal hypoattenuation within the cerebral white  matter is nonspecific, but consistent with chronic small vessel ischemic disease. Redemonstrated chronic lacunar infarcts within the left cerebral white matter and bilateral basal ganglia. There is no acute intracranial hemorrhage. No demarcated cortical infarct. No extra-axial fluid collection. No evidence of intracranial mass. No midline shift. Partially empty sella turcica Vascular: No hyperdense vessel.  Atherosclerotic calcifications Skull: Normal. Negative for fracture or focal lesion. Sinuses/Orbits: Visualized orbits show no acute finding. No significant paranasal sinus disease or mastoid effusion at the imaged levels. IMPRESSION: No evidence of acute intracranial abnormality. Stable mild generalized cerebral atrophy and moderate chronic small vessel ischemic disease. Redemonstrated chronic lacunar infarcts within the left cerebral white matter and bilateral basal ganglia. Electronically Signed   By: Kellie Simmering DO   On: 12/21/2019 16:29   CT HUMERUS LEFT WO CONTRAST  Result Date: 12/21/2019 CLINICAL DATA:  Left arm pain after fall EXAM: CT OF THE UPPER LEFT EXTREMITY WITHOUT CONTRAST TECHNIQUE: Multidetector CT imaging of the upper left extremity was performed according to the standard protocol. COMPARISON:  X-ray 12/20/2019 FINDINGS: Bones/Joint/Cartilage No evidence of acute fracture or dislocation. Moderate to severe osteoarthritis of the glenohumeral joint as manifested by complete joint space loss, subchondral sclerosis and cystic change and large marginal humeral head osteophytosis. Small glenohumeral joint  effusion, nonspecific. Mild degenerative changes of the acromioclavicular joint. Os acromiale. No appreciable subacromial-subdeltoid fluid collection. Elbow joint appears intact without fracture or dislocation. No elbow joint effusion. No suspicious lytic or sclerotic bone lesion is identified. Ligaments Suboptimally assessed by CT. Muscles and Tendons Preservation of muscle bulk. No acute  tendinous abnormality evident by CT. Soft tissues No soft tissue fluid collection or hematoma. IMPRESSION: 1. No acute osseous abnormality of the left humerus. 2. Moderate to severe osteoarthritis of the glenohumeral joint. 3. Small glenohumeral joint effusion, nonspecific. 4. Os acromiale. Electronically Signed   By: Davina Poke D.O.   On: 12/21/2019 15:50   MR ANGIO HEAD WO CONTRAST  Result Date: 12/22/2019 CLINICAL DATA:  69 year old female status post fall. Weakness. Atrial fibrillation. Several small acute infarcts on MRI today. EXAM: MRA HEAD WITHOUT CONTRAST TECHNIQUE: Angiographic images of the Circle of Willis were obtained using MRA technique without intravenous contrast. COMPARISON:  MRI today reported separately. FINDINGS: Mildly motion degraded. Antegrade flow in the posterior circulation with dominant left vertebral artery. Patent vertebrobasilar junction. No distal vertebral or basilar stenosis. Patent SCA and PCA origins, with a fetal type left PCA origin and small right posterior communicating artery. Bilateral PCA branches are within normal limits when allowing for motion. Antegrade flow in both ICA siphons. No siphon stenosis. Infundibulum suspected at the origin of the left ophthalmic artery (normal variant). Posterior communicating artery origins are within normal limits. Patent carotid termini. Patent MCA M1 and ACA A1 segments, the left A1 is dominant. Anterior communicating artery and visible ACA branches are within normal limits. Both carotid bifurcations are patent. No MCA branch occlusion is identified. IMPRESSION: 1. Negative for large vessel occlusion. 2. Some circle-of-Willis branch detail is degraded by motion, but relatively mild for age intracranial atherosclerosis is suspected. 3. Infundibulum suspected at the left ophthalmic artery origin (normal variant). Electronically Signed   By: Genevie Ann M.D.   On: 12/22/2019 22:14   MR BRAIN WO CONTRAST  Result Date:  12/22/2019 CLINICAL DATA:  69 year old female status post fall. Weakness. Atrial fibrillation. EXAM: MRI HEAD WITHOUT CONTRAST TECHNIQUE: Multiplanar, multiecho pulse sequences of the brain and surrounding structures were obtained without intravenous contrast. COMPARISON:  Head CT yesterday.  Brain MRI 01/14/2005. FINDINGS: Brain: There are 4 small foci of restricted diffusion identified in the bilateral cerebral hemispheres. The largest measures about 10 mm in the posterior right centrum semiovale (series 7, image 16). There are also punctate foci of cortical restricted diffusion in the contralateral left parietal lobe, right lateral parietal lobe, left occipital lobe. Mild if any associated T2 and FLAIR hyperintensity in those areas with no acute hemorrhage or mass effect. Patchy and confluent bilateral cerebral white matter T2 and FLAIR hyperintensity plus occasional small areas of cortical encephalomalacia (right parietal lobe series 15, image 36), dilated perivascular spaces throughout the deep gray nuclei, probably several chronic deep gray nuclei lacunar infarcts, chronic lacune of the central pons, and a small number of chronic microhemorrhages in the deep gray nuclei (thalami series 13, images 30 and 31). No superimposed midline shift, mass effect, evidence of mass lesion, ventriculomegaly, extra-axial collection or acute intracranial hemorrhage. Cervicomedullary junction and pituitary are within normal limits. Vascular: Major intracranial vascular flow voids are stable since 2006. Skull and upper cervical spine: Negative visible cervical spine. Bone marrow signal appears stable since 2006 and within normal limits. Sinuses/Orbits: Negative. Other: Mastoids are clear. Grossly normal visible internal auditory structures. Negative visible scalp and face. IMPRESSION: 1. Positive for several  small acute infarcts in the both cerebral hemispheres - the largest in the posterior right centrum semiovale is 10 mm. No  associated hemorrhage or mass effect. This pattern could indicate a recent embolic event in the setting of atrial fibrillation, or might reflect synchronous small vessel disease (#2). 2. Underlying advanced chronic small vessel disease, including occasional small chronic cortical infarcts and several chronic microhemorrhages in the bilateral deep gray nuclei. Electronically Signed   By: Genevie Ann M.D.   On: 12/22/2019 22:10      IMPRESSION AND PLAN:   1.  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   -The patient will be admitted to a progressive unit bed. -We will follow neuro checks every 4 hours for 24 hours. -She will be placed on aspirin as well as statin therapy. -We will check fasting lipids in a.m. -We will start the patient on p.o. Coumadin as this is likely Pradaxa failure. -We will obtain neurology, PT/OT and ST consults. -I notified Dr. Leonel Ramsay about the patient.  2.  Essential hypertension. -Continue Norvasc and Toprol-XL.  3.  Gout. -We will continue colchicine.  4.  Depression and anxiety. -Continue Wellbutrin XL and Xanax.  5.  Hypothyroidism. -We will continue Synthroid and check TSH level.  6.  Type 2 diabetes mellitus with peripheral neuropathy. -We will place the patient on supplemental coverage with NovoLog and hold Glucophage off. -We will continue Lyrica.  7.  GERD. -We will continue PPI therapy as well as Carafate.  8.  DVT prophylaxis. -Subcutaneous Lovenox    All the records are reviewed and case discussed with ED provider. The plan of care was discussed in details with the patient (and family). I answered all questions. The patient agreed to proceed with the above mentioned plan. Further management will depend upon hospital course.   CODE STATUS: Full code  Status is:  Inpatient  Remains inpatient appropriate because:Altered mental status, Ongoing diagnostic testing needed not appropriate for outpatient work up, Unsafe d/c plan, IV treatments appropriate due to intensity of illness or inability to take PO and Inpatient level of care appropriate due to severity of illness   Dispo: The patient is from: Home              Anticipated d/c is to: SNF              Anticipated d/c date is: 3 days              Patient currently is not medically stable to d/c.   TOTAL TIME TAKING CARE OF THIS PATIENT: 55 minutes.    Christel Mormon M.D on 12/23/2019 at 3:45 AM  [facility]   From 7 PM-7 AM, contact night-coverage [email or web address]  CC: Primary care physician; [name], [name], MD

## 2019-12-23 NOTE — ED Notes (Signed)
Pt is able to move fingers of L hand and pronate hand. Pt unwilling to move arm or allow staff to move arm for bed change.

## 2019-12-24 LAB — BASIC METABOLIC PANEL
Anion gap: 10 (ref 5–15)
BUN: 19 mg/dL (ref 8–23)
CO2: 29 mmol/L (ref 22–32)
Calcium: 8.6 mg/dL — ABNORMAL LOW (ref 8.9–10.3)
Chloride: 100 mmol/L (ref 98–111)
Creatinine, Ser: 1.06 mg/dL — ABNORMAL HIGH (ref 0.44–1.00)
GFR calc Af Amer: >60 mL/min (ref >60)
GFR calc non Af Amer: 53 mL/min — ABNORMAL LOW (ref >60)
Glucose, Bld: 179 mg/dL — ABNORMAL HIGH (ref 70–99)
Potassium: 4.2 mmol/L (ref 3.5–5.1)
Sodium: 139 mmol/L (ref 135–145)

## 2019-12-24 LAB — CBC
HCT: 31.4 % — ABNORMAL LOW (ref 36.0–46.0)
Hemoglobin: 9.8 g/dL — ABNORMAL LOW (ref 12.0–15.0)
MCH: 25.7 pg — ABNORMAL LOW (ref 26.0–34.0)
MCHC: 31.2 g/dL (ref 30.0–36.0)
MCV: 82.2 fL (ref 80.0–100.0)
Platelets: 292 10*3/uL (ref 150–400)
RBC: 3.82 MIL/uL — ABNORMAL LOW (ref 3.87–5.11)
RDW: 15.9 % — ABNORMAL HIGH (ref 11.5–15.5)
WBC: 8.1 10*3/uL (ref 4.0–10.5)
nRBC: 0 % (ref 0.0–0.2)

## 2019-12-24 LAB — MAGNESIUM: Magnesium: 1.6 mg/dL — ABNORMAL LOW (ref 1.7–2.4)

## 2019-12-24 MED ORDER — MAGNESIUM SULFATE 2 GM/50ML IV SOLN
2.0000 g | Freq: Once | INTRAVENOUS | Status: AC
  Administered 2019-12-24: 2 g via INTRAVENOUS
  Filled 2019-12-24: qty 50

## 2019-12-24 MED ORDER — POLYETHYLENE GLYCOL 3350 17 G PO PACK
17.0000 g | PACK | Freq: Two times a day (BID) | ORAL | Status: DC | PRN
  Administered 2019-12-26 – 2019-12-27 (×2): 17 g via ORAL
  Filled 2019-12-24 (×2): qty 1

## 2019-12-24 MED ORDER — APIXABAN 5 MG PO TABS
5.0000 mg | ORAL_TABLET | Freq: Two times a day (BID) | ORAL | Status: DC
  Administered 2019-12-24 – 2019-12-27 (×6): 5 mg via ORAL
  Filled 2019-12-24 (×6): qty 1

## 2019-12-24 MED ORDER — ALUM & MAG HYDROXIDE-SIMETH 200-200-20 MG/5ML PO SUSP: 15.0000 mL | Freq: Four times a day (QID) | ORAL | Status: DC | PRN

## 2019-12-24 MED ORDER — CALCIUM CARBONATE ANTACID 500 MG PO CHEW
1.0000 | CHEWABLE_TABLET | Freq: Three times a day (TID) | ORAL | Status: DC | PRN
  Administered 2019-12-24: 200 mg via ORAL
  Filled 2019-12-24: qty 1

## 2019-12-24 MED ORDER — DOCUSATE SODIUM 100 MG PO CAPS
100.0000 mg | ORAL_CAPSULE | Freq: Two times a day (BID) | ORAL | Status: DC | PRN
  Administered 2019-12-26: 100 mg via ORAL
  Filled 2019-12-24: qty 1

## 2019-12-24 MED ORDER — GUAIFENESIN-DM 100-10 MG/5ML PO SYRP: 10.0000 mL | ORAL_SOLUTION | Freq: Four times a day (QID) | ORAL | Status: DC | PRN

## 2019-12-24 NOTE — ED Notes (Signed)
Patient doctor saw her and patient was taken off 1 L oxygen and placed on room air. Patient O2 sats have been around 96%

## 2019-12-24 NOTE — TOC Progression Note (Signed)
Transition of Care [facility]) - Progression Note    Patient Details  Name: [name] MRN: [id number] Date of Birth: [birth year]  Transition of Care [facility]) CM/SW [place], LCSW Phone Number: 12/24/2019, 5:18 PM  Clinical Narrative:   Patient will discharged to SNF - social worker has been in touch with [facility] for possible discharge.    Patient is also interested in [place] [facility] [place].     Expected Discharge Plan: Skilled Nursing Facility Barriers to Discharge: Continued Medical Work up  Expected Discharge Plan and Services Expected Discharge Plan: [place] In-house Referral: Clinical Social Work   Post Acute Care Choice: [place] arrangements for the past 2 months: [place] Determinants of Health (SDOH) Interventions    Readmission Risk Interventions Readmission Risk Prevention Plan 07/30/2018  Transportation Screening Complete  PCP or Specialist Appt within 3-5 Days Complete  HRI or Home Care Consult Complete  Social Work Consult for [place] Planning/Counseling Patient refused  Palliative Care Screening Patient Refused  Medication Review Press photographer) Complete  Some recent data might be hidden

## 2019-12-24 NOTE — Progress Notes (Signed)
Ms. Boer is feeling well today, she continues to have left-sided weakness.  She is able to just barely lift her left arm against gravity, 4+/5 in her left leg.  There is some limitation due to pain as well.  The MRI shows strokes consistent with a cardioembolic source, given that she has had approximately 2 weeks of symptoms, I think that we can continue anticoagulation.  Given that she has not missed any recent doses of Pradaxa (held for endoscopy last month, but nothing more recent) I discussed options including changing anticoagulant therapy and she and her husband are in agreement with changing to Eliquis.  Her LDL is 44 which is at goal, as is her A1c of 6.9.  At this point, she will need physical therapy, and I will change her from Pradaxa to Eliquis, but no further recommendations beyond this.  I doubt that an echo is likely to change management given that she is already on anticoagulation.  Please call if neurology can be of any further assistance.  Roland Rack, MD [facility] [phone]  If 7pm- 7am, please page neurology on call as listed in [place].

## 2019-12-24 NOTE — TOC Progression Note (Signed)
Transition of Care [facility]) - Progression Note    Patient Details  Name: [name] MRN: [id number] Date of Birth: 01-Mar-1951  Transition of Care [facility]) CM/SW [place], LCSW Phone Number: 12/24/2019, 9:04 AM  Clinical Narrative:   TOC CM/SW spoke to the patient's husband Mr. Berkley Wrightsman regarding SNF bed offers, [facility] and [facility], accepted bed request. Mr. Klinkner stated that he does not want his wife to go to [facility] or [facility]. He noted that he heard from friends that "that place is not a good place.    TOC discussed other options and assured him that we will do our best to assist during this search for SNF bed.    Plan: Social worker Warehouse manager. Social worker will update as offers come in.      Expected Discharge Plan: Skilled Nursing Facility Barriers to Discharge: Continued Medical Work up  Expected Discharge Plan and Services Expected Discharge Plan: [place] In-house Referral: Clinical Social Work   Post Acute Care Choice: [place] arrangements for the past 2 months: [place] Determinants of Health (SDOH) Interventions    Readmission Risk Interventions Readmission Risk Prevention Plan 07/30/2018  Transportation Screening Complete  PCP or Specialist Appt within 3-5 Days Complete  HRI or Home Care Consult Complete  Social Work Consult for [place] Planning/Counseling Patient refused  Palliative Care Screening Patient Refused  Medication Review Press photographer) Complete  Some recent data might be hidden

## 2019-12-24 NOTE — ED Notes (Signed)
Attempted to call report to June, RN on 1A, he went to lunch will call back in 30 minutes

## 2019-12-24 NOTE — Progress Notes (Signed)
PROGRESS NOTE    [name]  GYF:[id number] DOB: September 02, 1950 DOA: 12/21/2019 PCP: [name], [name], MD    Assessment & Plan:   Active Problems:   Acute embolic stroke [facility])    [name]  is a 69 y.o. Caucasian female with a known history of multiple medical problems including type 2 diabetes mellitus, hypertension, hypothyroidism, depression and asthma, presented to the emergency room with acute onset of altered mental status with confusion as well as generalized weakness more on the left than the right side in th up extremity.  She admitted to recurrent dizziness.  No chest pain or dyspnea or palpitations.  She has been having paresthesias with her symptoms.  She admitted to expressive dysphasia without dysphagia.  No nausea vomiting or abdominal pain.  No bleeding diathesis.    1.  Acute bilateral likely embolic ischemic infarctions  --presented with generalized weakness and altered mental status with confusion and left-sided hemiparesis.   --MRI of the brain showed bilateral small acute infarcts with the largest being in the right centrum semiovale.  Per neuro, Cardioembolic etiology likely with history of atrial fibrillation.  Patient on home Pradaxa.   --MRA shows no evidence of large vessel occlusion.  Carotid US no significant stenosis.  A1c 6.9.  LDL 44 PLAN: --no need for TTE since already on anticogulation --change to Eliquis, per neuro --d/c tele --SNF rehab --cont statin  2.  Essential hypertension. --on amlodipine, losartan, HCTZ and Toprol at home --cont current regimen  3.  Gout. -We will continue colchicine.  4.  Depression and anxiety. --on Bupropion, Cymbalta, and Xanax PRN at home --cont home regimen  5.  Hypothyroidism. -cont home Synthroid  6.  Type 2 diabetes mellitus with peripheral neuropathy. -A1c 6.9.  Controlled.  On metformin at home --no need for fingersticks. --cont home lyrica  7.  GERD. -We will continue PPI therapy  as well as Carafate.   DVT prophylaxis: [id number] and Pradaxa  Code Status: Full code  Family Communication:  Status is: inpatient Dispo:   The patient is from: home Anticipated d/c is to: SNF Anticipated d/c date is: whenever bed available Patient currently is medically stable to d/c.   Subjective and Interval History:  Pt reported feeling better today.  Ate.  Agreeable to go to short-term rehab.   Objective: Vitals:   12/24/19 1130 12/24/19 1200 12/24/19 1230 12/24/19 1344  BP: 125/64 (!) 113/57 112/80 120/80  Pulse: 74 71  72  Resp:      Temp:    98.1 F (36.7 C)  TempSrc:    Oral  SpO2: 95% 96%  94%  Weight:      Height:        Intake/Output Summary (Last 24 hours) at 12/24/2019 1503 Last data filed at 12/24/2019 0900 Gross per 24 hour  Intake --  Output 1000 ml  Net -1000 ml   Filed Weights   12/20/19 1422  Weight: 115.4 kg    Examination:   Constitutional: NAD, AAOx3 HEENT: conjunctivae and lids normal, EOMI CV: RRR no M,R,G. Distal pulses +2.  No cyanosis.   RESP: CTA B/L, normal respiratory effort  GI: +BS, NTND Extremities: both legs and feet appear smaller than expected SKIN: warm, dry and intact Neuro: II - XII grossly intact.  Sensation intact Psych: better mood and affect.      Data Reviewed: I have personally reviewed following labs and imaging studies  CBC: Recent Labs  Lab 12/19/19 1700 12/19/19 2132 12/23/19 0644 12/24/19  0534  WBC 8.3 7.0 9.2 8.1  NEUTROABS  --  4.7  --   --   HGB 9.9* 9.7* 10.3* 9.8*  HCT 32.5* 31.6* 30.6* 31.4*  MCV 84.4 84.3 78.3* 82.2  PLT 286 302 308 127   Basic Metabolic Panel: Recent Labs  Lab 12/19/19 1700 12/22/19 0712 12/23/19 0644 12/24/19 0534  NA 136  --  135 139  K 4.7  --  3.6 4.2  CL 102  --  99 100  CO2 26  --  25 29  GLUCOSE 104*  --  144* 179*  BUN 23  --  16 19  CREATININE 1.31* 0.93 0.92 1.06*  CALCIUM 8.7*  --  8.3* 8.6*  MG  --   --   --  1.6*   GFR: Estimated  Creatinine Clearance (by C-G formula based on SCr of 1.06 mg/dL (H)) Female: 62.7 mL/min (A) Female: 76.2 mL/min (A) Liver Function Tests: Recent Labs  Lab 12/19/19 2132  AST 14*  ALT 10  ALKPHOS 84  BILITOT 0.5  PROT 7.0  ALBUMIN 3.5   No results for input(s): LIPASE, AMYLASE in the last 168 hours. No results for input(s): AMMONIA in the last 168 hours. Coagulation Profile: Recent Labs  Lab 12/23/19 0644  INR 1.3*   Cardiac Enzymes: No results for input(s): CKTOTAL, CKMB, CKMBINDEX, TROPONINI in the last 168 hours. BNP (last 3 results) No results for input(s): PROBNP in the last 8760 hours. HbA1C: Recent Labs    12/23/19 0644  HGBA1C 6.9*   CBG: No results for input(s): GLUCAP in the last 168 hours. Lipid Profile: Recent Labs    12/23/19 0644  CHOL 122  HDL 63  LDLCALC 44  TRIG 77  CHOLHDL 1.9   Thyroid Function Tests: No results for input(s): TSH, T4TOTAL, FREET4, T3FREE, THYROIDAB in the last 72 hours. Anemia Panel: No results for input(s): VITAMINB12, FOLATE, FERRITIN, TIBC, IRON, RETICCTPCT in the last 72 hours. Sepsis Labs: Recent Labs  Lab 12/19/19 1700 12/20/19 0142  LATICACIDVEN 2.0* 1.1    Recent Results (from the past 240 hour(s))  Urine culture     Status: Abnormal   Collection Time: 12/20/19  1:42 AM   Specimen: Urine, Random  Result Value Ref Range Status   Specimen Description   Final    URINE, RANDOM Performed at [facility], [address] [place], [place] [address]    Special Requests   Final    NONE Performed at [facility], [address] [place], [place] [address]    Culture MULTIPLE SPECIES PRESENT, SUGGEST RECOLLECTION (A)  Final   Report Status 12/20/2019 FINAL  Final  Respiratory Panel by RT PCR (Flu A&B, Covid) - Nasopharyngeal Swab     Status: None   Collection Time: 12/23/19  1:13 AM   Specimen: Nasopharyngeal Swab  Result Value Ref Range Status   SARS Coronavirus 2 by RT PCR NEGATIVE NEGATIVE  Final    Comment: (NOTE) SARS-CoV-2 target nucleic acids are NOT DETECTED.  The SARS-CoV-2 RNA is generally detectable in upper respiratoy specimens during the acute phase of infection. The lowest concentration of SARS-CoV-2 viral copies this assay can detect is 131 copies/mL. A negative result does not preclude SARS-Cov-2 infection and should not be used as the sole basis for treatment or other patient management decisions. A negative result may occur with  improper specimen collection/handling, submission of specimen other than nasopharyngeal swab, presence of viral mutation(s) within the areas targeted by this assay, and  inadequate number of viral copies (<131 copies/mL). A negative result must be combined with clinical observations, patient history, and epidemiological information. The expected result is Negative.  Fact Sheet for Patients:  PinkCheek.be  Fact Sheet for Healthcare Providers:  GravelBags.it  This test is no t yet approved or cleared by the [place] FDA and  has been authorized for detection and/or diagnosis of SARS-CoV-2 by FDA under an Emergency Use Authorization (EUA). This EUA will remain  in effect (meaning this test can be used) for the duration of the COVID-19 declaration under Section 564(b)(1) of the Act, 21 U.S.C. section 360bbb-3(b)(1), unless the authorization is terminated or revoked sooner.     Influenza A by PCR NEGATIVE NEGATIVE Final   Influenza B by PCR NEGATIVE NEGATIVE Final    Comment: (NOTE) The Xpert Xpress SARS-CoV-2/FLU/RSV assay is intended as an aid in  the diagnosis of influenza from Nasopharyngeal swab specimens and  should not be used as a sole basis for treatment. Nasal washings and  aspirates are unacceptable for Xpert Xpress SARS-CoV-2/FLU/RSV  testing.  Fact Sheet for Patients: PinkCheek.be  Fact Sheet for Healthcare  Providers: GravelBags.it  This test is not yet approved or cleared by the [place] FDA and  has been authorized for detection and/or diagnosis of SARS-CoV-2 by  FDA under an Emergency Use Authorization (EUA). This EUA will remain  in effect (meaning this test can be used) for the duration of the  Covid-19 declaration under Section 564(b)(1) of the Act, 21  U.S.C. section 360bbb-3(b)(1), unless the authorization is  terminated or revoked. Performed at [facility], [address], [place], [place] [address]       Radiology Studies: MR ANGIO HEAD WO CONTRAST  Result Date: 12/22/2019 CLINICAL DATA:  69 year old female status post fall. Weakness. Atrial fibrillation. Several small acute infarcts on MRI today. EXAM: MRA HEAD WITHOUT CONTRAST TECHNIQUE: Angiographic images of the Circle of Willis were obtained using MRA technique without intravenous contrast. COMPARISON:  MRI today reported separately. FINDINGS: Mildly motion degraded. Antegrade flow in the posterior circulation with dominant left vertebral artery. Patent vertebrobasilar junction. No distal vertebral or basilar stenosis. Patent SCA and PCA origins, with a fetal type left PCA origin and small right posterior communicating artery. Bilateral PCA branches are within normal limits when allowing for motion. Antegrade flow in both ICA siphons. No siphon stenosis. Infundibulum suspected at the origin of the left ophthalmic artery (normal variant). Posterior communicating artery origins are within normal limits. Patent carotid termini. Patent MCA M1 and ACA A1 segments, the left A1 is dominant. Anterior communicating artery and visible ACA branches are within normal limits. Both carotid bifurcations are patent. No MCA branch occlusion is identified. IMPRESSION: 1. Negative for large vessel occlusion. 2. Some circle-of-Willis branch detail is degraded by motion, but relatively mild for age intracranial  atherosclerosis is suspected. 3. Infundibulum suspected at the left ophthalmic artery origin (normal variant). Electronically Signed   By: Genevie Ann M.D.   On: 12/22/2019 22:14   MR BRAIN WO CONTRAST  Result Date: 12/22/2019 CLINICAL DATA:  69 year old female status post fall. Weakness. Atrial fibrillation. EXAM: MRI HEAD WITHOUT CONTRAST TECHNIQUE: Multiplanar, multiecho pulse sequences of the brain and surrounding structures were obtained without intravenous contrast. COMPARISON:  Head CT yesterday.  Brain MRI 01/14/2005. FINDINGS: Brain: There are 4 small foci of restricted diffusion identified in the bilateral cerebral hemispheres. The largest measures about 10 mm in the posterior right centrum semiovale (series 7, image 16). There are  also punctate foci of cortical restricted diffusion in the contralateral left parietal lobe, right lateral parietal lobe, left occipital lobe. Mild if any associated T2 and FLAIR hyperintensity in those areas with no acute hemorrhage or mass effect. Patchy and confluent bilateral cerebral white matter T2 and FLAIR hyperintensity plus occasional small areas of cortical encephalomalacia (right parietal lobe series 15, image 36), dilated perivascular spaces throughout the deep gray nuclei, probably several chronic deep gray nuclei lacunar infarcts, chronic lacune of the central pons, and a small number of chronic microhemorrhages in the deep gray nuclei (thalami series 13, images 30 and 31). No superimposed midline shift, mass effect, evidence of mass lesion, ventriculomegaly, extra-axial collection or acute intracranial hemorrhage. Cervicomedullary junction and pituitary are within normal limits. Vascular: Major intracranial vascular flow voids are stable since 2006. Skull and upper cervical spine: Negative visible cervical spine. Bone marrow signal appears stable since 2006 and within normal limits. Sinuses/Orbits: Negative. Other: Mastoids are clear. Grossly normal visible  internal auditory structures. Negative visible scalp and face. IMPRESSION: 1. Positive for several small acute infarcts in the both cerebral hemispheres - the largest in the posterior right centrum semiovale is 10 mm. No associated hemorrhage or mass effect. This pattern could indicate a recent embolic event in the setting of atrial fibrillation, or might reflect synchronous small vessel disease (#2). 2. Underlying advanced chronic small vessel disease, including occasional small chronic cortical infarcts and several chronic microhemorrhages in the bilateral deep gray nuclei. Electronically Signed   By: Genevie Ann M.D.   On: 12/22/2019 22:10   US Carotid Bilateral (at [facility] and [facility] only)  Result Date: 12/23/2019 CLINICAL DATA:  Stroke.  Hypertension, hyperlipidemia, diabetes EXAM: BILATERAL CAROTID DUPLEX ULTRASOUND TECHNIQUE: Pearline Cables scale imaging, color Doppler and duplex ultrasound were performed of bilateral carotid and vertebral arteries in the neck. Technologist describes technically difficult study secondary to altered mental status of the patient. COMPARISON:  None. FINDINGS: Criteria: Quantification of carotid stenosis is based on velocity parameters that correlate the residual internal carotid diameter with NASCET-based stenosis levels, using the diameter of the distal internal carotid lumen as the denominator for stenosis measurement. The following velocity measurements were obtained: RIGHT ICA: 85/19 cm/sec CCA: 00/86 cm/sec SYSTOLIC ICA/CCA RATIO:  1.2 ECA: 109 cm/sec LEFT ICA: 84/22 cm/sec CCA: 76/19 cm/sec SYSTOLIC ICA/CCA RATIO:  1.1 ECA: 98 cm/sec RIGHT CAROTID ARTERY: Eccentric calcified plaque in the bulb with only mild stenosis. Mild tortuosity. Normal waveforms and color Doppler signal throughout. RIGHT VERTEBRAL ARTERY:  Normal flow direction and waveform. LEFT CAROTID ARTERY: Minimal plaque in the bulb. No significant stenosis. Normal waveforms and color Doppler signal throughout. LEFT VERTEBRAL  ARTERY:  Normal flow direction and waveform. IMPRESSION: 1. Bilateral carotid bifurcation plaque resulting in less than 50% diameter ICA stenosis. 2. Antegrade bilateral vertebral arterial flow. Electronically Signed   By: Lucrezia Europe M.D.   On: 12/23/2019 10:25     Scheduled Meds: .  stroke: mapping our early stages of recovery book   Does not apply Once  . amLODipine  5 mg Oral Daily  . buPROPion  150 mg Oral Daily  . cetirizine  5 mg Oral QPM  . dabigatran  150 mg Oral BID  . darifenacin  7.5 mg Oral Daily  . DULoxetine  60 mg Oral Daily  . fesoterodine  8 mg Oral Daily  . losartan  50 mg Oral Daily   And  . hydrochlorothiazide  12.5 mg Oral Daily  . ipratropium-albuterol  3 mL Nebulization  Q6H  . levothyroxine  175 mcg Oral QAC breakfast  . metoprolol succinate  50 mg Oral Daily  . mometasone-formoterol  2 puff Inhalation BID  . montelukast  10 mg Oral QHS  . oxybutynin  10 mg Oral QHS  . pantoprazole  40 mg Oral Daily  . pregabalin  50 mg Oral TID  . sucralfate  1 g Oral TID with meals   Continuous Infusions:   LOS: 2 days     Enzo Bi, MD [facility] If 7PM-7AM, please contact night-coverage 12/24/2019, 3:03 PM

## 2019-12-24 NOTE — ED Notes (Signed)
Placed patient on 1 L oxygen due to her O2 sats hanging at 91%

## 2019-12-24 NOTE — Progress Notes (Signed)
Physical Therapy Treatment Patient Details Name: [name] MRN: [id number] DOB: Sep 11, 1950 Today's Date: 12/24/2019    History of Present Illness [name] is a 69 y.o. adult with possible history of hypertension, diabetes, chronic pain, COPD, and chronic atrial fibrillation on Pradaxa who presents to the ED complaining of weakness.  History is limited due to patient's occasional confusion.  She reportedly has been increasingly weak at home over the past couple of days with multiple falls.  Patient states she has been doing fine and her husband is able to care for her, however he reported to EMS that he has been unable to do so.  Patient currently only complains of pain to her left forearm from where her husband has been pulling her.    PT Comments    Pt sleeping upon arrival with O2 alarm sounding 88% with O2 support.  Pt awoke easily and sats increased to low 90's.  She is engaged and conversive intially during session.  She is able to assist with BLE ex with good effort.  Pt reports continued pain L UE and that she had not moved it for several days due to pain.  Imaging reviewed prior to session.  Pt flinches with any touch of LUE.  With slow gentle movement she is able to allow finger and wrist ROM with time.  Pronation/supination is limited along with elbow flexion.  Rest breaks given and distraction techniques/converation in attempt to allow for increased ROM but pt seems to shut down and turns head to right avoiding eye contact and appears on the verge of tears and stops answering questions without repeating and max encouragement to respond.  Encouragement given. Does not allow any shoulder movement.  Did not progress with any bed mobility as she stated it was too painful.     Follow Up Recommendations  SNF;Supervision/Assistance - 24 hour     Equipment Recommendations  None recommended by PT    Recommendations for Other Services       Precautions / Restrictions  Precautions Precautions: Fall Restrictions Weight Bearing Restrictions: No    Mobility  Bed Mobility                  Transfers                    Ambulation/Gait                 Stairs             Wheelchair Mobility    Modified Rankin (Stroke Patients Only)       Balance                                            Cognition Arousal/Alertness: Awake/alert Behavior During Therapy: Flat affect Overall Cognitive Status: Within Functional Limits for tasks assessed                                        Exercises Other Exercises Other Exercises: BLE A/AAROM x 10 and glut sets in supine    General Comments        Pertinent Vitals/Pain Pain Assessment: Faces Faces Pain Scale: Hurts worst Pain Location: L arm Pain Descriptors / Indicators: Aching;Grimacing;Guarding;Discomfort;Sore Pain Intervention(s): Limited activity within patient's tolerance;Monitored during  session    Home Living                      Prior Function            PT Goals (current goals can now be found in the care plan section) Progress towards PT goals: Not progressing toward goals - comment    Frequency    Min 2X/week      PT Plan Current plan remains appropriate    Co-evaluation              AM-PAC PT "6 Clicks" Mobility   Outcome Measure  Help needed turning from your back to your side while in a flat bed without using bedrails?: Total Help needed moving from lying on your back to sitting on the side of a flat bed without using bedrails?: Total Help needed moving to and from a bed to a chair (including a wheelchair)?: Total Help needed standing up from a chair using your arms (e.g., wheelchair or bedside chair)?: Total Help needed to walk in hospital room?: Total Help needed climbing 3-5 steps with a railing? : Total 6 Click Score: 6    End of Session   Activity Tolerance: Patient limited by  pain Patient left: in bed;with call bell/phone within reach;with bed alarm set Nurse Communication: Other (comment) Pain - Right/Left: Left Pain - part of body: Arm     Time: 6195-0932 PT Time Calculation (min) (ACUTE ONLY): 27 min  Charges:  $Therapeutic Exercise: 23-37 mins                    Chesley Noon, PTA 12/24/19, 9:31 AM

## 2019-12-24 NOTE — Consult Note (Signed)
ANTICOAGULATION CONSULT NOTE - Initial Consult  Pharmacy Consult for Eliquis Indication: atrial fibrillation  Allergies  Allergen Reactions  . Morphine And Related Other (See Comments) and Nausea Only    Patient becomes very confused Other reaction(s): Other (See Comments), Other (See Comments) Patient becomes very confused Patient becomes very confused   . Penicillins Hives, Shortness Of Breath, Swelling and Other (See Comments)    Facial swelling Has patient had a PCN reaction causing immediate rash, facial/tongue/throat swelling, SOB or lightheadedness with hypotension: Yes Has patient had a PCN reaction causing severe rash involving mucus membranes or skin necrosis: Yes Has patient had a PCN reaction that required hospitalization No Has patient had a PCN reaction occurring within the last 10 years: No If all of the above answers are "NO", then may proceed with Cephalosporin use.  Other reaction(s): Other (See Comments), Other (See Comments) Facial swelling Has patient had a PCN reaction causing immediate rash, facial/tongue/throat swelling, SOB or lightheadedness with hypotension: Yes Has patient had a PCN reaction causing severe rash involving mucus membranes or skin necrosis: Yes Has patient had a PCN reaction that required hospitalization No Has patient had a PCN reaction occurring within the last 10 years: No If all of the above answers are "NO", then may proceed with Cephalosporin use. Facial swelling Has patient had a PCN reaction causing immediate rash, facial/tongue/throat swelling, SOB or lightheadedness with hypotension: Yes Has patient had a PCN reaction causing severe rash involving mucus membranes or skin necrosis: Yes Has patient had a PCN reaction that required hospitalization No Has patient had a PCN reaction occurring within the last 10 years: No If all of the above answers are "NO", then may proceed with Cephalosporin use. Facial swelling Has patient had a PCN  reaction causing immediate rash, facial/tongue/throat swelling, SOB or lightheadedness with hypotension: Yes Has patient had a PCN reaction causing severe rash involving mucus membranes or skin necrosis: Yes Has patient had a PCN reaction that required hospitalization No Has patient had a PCN reaction occurring within the last 10 years: No If all of the above answers are "NO", then may proceed with Cephalosporin use.   . Ambien [Zolpidem]     Hallucination   . Aspirin Hives  . Oxytetracycline Hives    Other reaction(s): Unknown     Patient Measurements: Height: 5\' 5"  (165.1 cm) Weight: 115.4 kg (254 lb 6.6 oz) IBW/kg (Calculated) : 57   Vital Signs: Temp: 97.8 F (36.6 C) (10/03 1538) Temp Source: Oral (10/03 1538) BP: 118/69 (10/03 1538) Pulse Rate: 71 (10/03 1538)  Labs: Recent Labs    12/22/19 0712 12/23/19 0644 12/24/19 0534  HGB  --  10.3* 9.8*  HCT  --  30.6* 31.4*  PLT  --  308 292  LABPROT  --  16.1*  --   INR  --  1.3*  --   CREATININE 0.93 0.92 1.06*    Estimated Creatinine Clearance (by C-G formula based on SCr of 1.06 mg/dL (H)) Female: 62.7 mL/min (A) Female: 76.2 mL/min (A)   Medical History: Past Medical History:  Diagnosis Date  . Abnormal antibody titer   . Anxiety and depression   . Arthritis   . Asthma   . Cystocele   . Depression   . Diabetes ([place])   . DVT (deep venous thrombosis) ([facility])    Righ calf  . Dyspnea    11/08/2018 - "more now due to lack of exercise"  . Dysrhythmia    afib  . Edema   .  GERD (gastroesophageal reflux disease)   . Heart murmur    Echocardiogram June 2015: Mild MR (possible vegetation seen on TTE, not seen on TEE), normal LV size with moderate concentric LVH. Normal function EF 60-65%. Normal diastolic function. Mild LA dilation.  Marland Kitchen History of IBS   . History of kidney stones   . History of methicillin resistant staphylococcus aureus (MRSA) 2008  . Hyperlipidemia   . Hypertension   . Hypothyroidism   .  Insomnia   . Kidney stone    kidney stones with lithotripsy .  [facility]- "adrenal glands"  . Neuropathy involving both lower extremities   . Obesity, Class II, BMI 35-39.9, with comorbidity   . Paroxysmal atrial fibrillation ([place]) 2007   In June 2015, Cardiac Event Monitor: Mostly SR/sinus arrhythmia with PVCs that are frequent. Short bursts of A. fib lasting several minutes;; CHA2DS2-VASc Score = 5 (age, Female, PVD, DM, HTN)  . Peripheral vascular disease ([place])   . Sleep apnea    use C-PAP  . Urinary incontinence   . Venous stasis dermatitis of both lower extremities     Medications:  Pradaxa - last dose today AM  Assessment: 69 yo female with strokes "consistent with a cardioembolic source" and hx of PAF.    Pharmacy to change anticoagulation from Pradaxa to Eliquis and monitor per consult.  Goal of Therapy:  Monitor platelets by anticoagulation protocol: Yes   Plan:  Will change from Pradaxa to Eliquis 5mg  bid with this evening's dose.  Will monitor CBC/SCr a minimum of every 3 days per protocol.  Lu Duffel, PharmD, BCPS Clinical Pharmacist 12/24/2019 4:15 PM

## 2019-12-24 NOTE — Progress Notes (Signed)
Patient declines to use the hospital cpap unit at this time. Patient wishes to have her husband bring in her unit from home for tomorrow night. Patient has also been feeling sick to her stomach tonight. Will continue to follow. Will check in the patient's unit from home upon arrival.

## 2019-12-25 LAB — BASIC METABOLIC PANEL
Anion gap: 10 (ref 5–15)
BUN: 25 mg/dL — ABNORMAL HIGH (ref 8–23)
CO2: 29 mmol/L (ref 22–32)
Calcium: 8.5 mg/dL — ABNORMAL LOW (ref 8.9–10.3)
Chloride: 99 mmol/L (ref 98–111)
Creatinine, Ser: 1.12 mg/dL — ABNORMAL HIGH (ref 0.44–1.00)
GFR calc Af Amer: 58 mL/min — ABNORMAL LOW (ref >60)
GFR calc non Af Amer: 50 mL/min — ABNORMAL LOW (ref >60)
Glucose, Bld: 201 mg/dL — ABNORMAL HIGH (ref 70–99)
Potassium: 4.6 mmol/L (ref 3.5–5.1)
Sodium: 138 mmol/L (ref 135–145)

## 2019-12-25 LAB — CBC
HCT: 29.4 % — ABNORMAL LOW (ref 36.0–46.0)
Hemoglobin: 9.5 g/dL — ABNORMAL LOW (ref 12.0–15.0)
MCH: 26.2 pg (ref 26.0–34.0)
MCHC: 32.3 g/dL (ref 30.0–36.0)
MCV: 81.2 fL (ref 80.0–100.0)
Platelets: 338 10*3/uL (ref 150–400)
RBC: 3.62 MIL/uL — ABNORMAL LOW (ref 3.87–5.11)
RDW: 15.9 % — ABNORMAL HIGH (ref 11.5–15.5)
WBC: 8 10*3/uL (ref 4.0–10.5)
nRBC: 0 % (ref 0.0–0.2)

## 2019-12-25 LAB — MAGNESIUM: Magnesium: 2.2 mg/dL (ref 1.7–2.4)

## 2019-12-25 NOTE — Progress Notes (Signed)
Occupational Therapy Treatment Patient Details Name: [name] MRN: [id number] DOB: 19-Jul-1950 Today's Date: 12/25/2019    History of present illness [name] is a 69 y.o. adult with possible history of hypertension, diabetes, chronic pain, COPD, and chronic atrial fibrillation on Pradaxa who presents to the ED complaining of weakness.  History is limited due to patient's occasional confusion.  She reportedly has been increasingly weak at home over the past couple of days with multiple falls.  Patient states she has been doing fine and her husband is able to care for her, however he reported to EMS that he has been unable to do so.  Patient currently only complains of pain to her left forearm from where her husband has been pulling her.   OT comments  [name] continues to present with generalized weakness, low endurance, and LUE pain that impacts her ability to safely and independently complete functional tasks.  OTR provided mod assist x2 for sit <> supine transfers and CGA-mod assist for sitting balance.  OTR provided max assist x2 to attempt sit to stand transfers with RW but pt unable to clear hips off surface of bed with 3 attempts.  Pt continues to complain of LUE pain (imaging reviewed prior to session).  Pt able to tolerate increased PROM this date.  OTR provided PROM to all joints of LUE.  Pt with full PROM of digits and wrist, elbow flexion to ~70 degrees, and shoulder flexion ~10 degrees.  OTR provided education on proper positioning for comfort and to prevent further injury of LUE.  [name] will continue to benefit from skilled OT services in acute setting to address functional strengthening, endurance, and safety and independence in ADLs.  Discharge recommendation remains appropriate.   Follow Up Recommendations  SNF;Supervision/Assistance - 24 hour    Equipment Recommendations  Other (comment) (defer to next venue of care)    Recommendations for Other Services       Precautions / Restrictions Precautions Precautions: Fall Precaution Comments: L UE pain Restrictions Weight Bearing Restrictions: No       Mobility Bed Mobility Overal bed mobility: Needs Assistance Bed Mobility: Supine to Sit;Sit to Supine     Supine to sit: Mod assist;+2 for physical assistance Sit to supine: Mod assist;+2 for physical assistance   General bed mobility comments: due to patient's inability to use L UE she requires extensive assist for basic mobility  Transfers Overall transfer level: Needs assistance Equipment used: Rolling walker (2 wheeled);1 person hand held assist Transfers: Sit to/from Stand Sit to Stand: Max assist;Total assist;+2 physical assistance;From elevated surface         General transfer comment: unable to raise hips off bed despite 3 attempts    Balance Overall balance assessment: Needs assistance Sitting-balance support: Feet supported;Single extremity supported Sitting balance-Leahy Scale: Poor Sitting balance - Comments: cue sto sit upright and not rely on staff.  close min gaurd at all times once she is able to sit unsupported                                   ADL either performed or assessed with clinical judgement   ADL Overall ADL's : Needs assistance/impaired                                       General  ADL Comments: Pt able to self feed with min assist from OTR to hold cup.  Pt remains max assist with other ADLs at bedlevel, max assist for bed mobility.     Vision Patient Visual Report: No change from baseline     Perception     Praxis      Cognition Arousal/Alertness: Awake/alert Behavior During Therapy: WFL for tasks assessed/performed Overall Cognitive Status: Difficult to assess Area of Impairment: Awareness;Safety/judgement;Orientation;Attention;Following commands                 Orientation Level: Person;Place Current Attention Level: Sustained   Following  Commands: Follows one step commands consistently;Follows one step commands with increased time Safety/Judgement: Decreased awareness of safety;Decreased awareness of deficits Awareness: Intellectual   General Comments: more engaged and very talkative today, somewhat distracted with cues to stay on task        Exercises Other Exercises Other Exercises: sat EOB x 5 minutes Other Exercises: provided PROM to LUE   Shoulder Instructions       General Comments      Pertinent Vitals/ Pain       Pain Assessment: Faces Faces Pain Scale: Hurts even more Pain Location: L arm Pain Descriptors / Indicators: Aching;Grimacing;Guarding;Discomfort;Sore Pain Intervention(s): Limited activity within patient's tolerance;Monitored during session  Home Living                                          Prior Functioning/Environment              Frequency  Min 1X/week        Progress Toward Goals  OT Goals(current goals can now be found in the care plan section)  Progress towards OT goals: Progressing toward goals  Acute Rehab OT Goals Patient Stated Goal: to go home OT Goal Formulation: With patient Time For Goal Achievement: 01/06/20 Potential to Achieve Goals: [place] Discharge plan remains appropriate;Frequency remains appropriate    Co-evaluation      Reason for Co-Treatment: Complexity of the patient's impairments (multi-system involvement) PT goals addressed during session: Mobility/safety with mobility OT goals addressed during session: ADL's and self-care;Strengthening/ROM      AM-PAC OT "6 Clicks" Daily Activity     Outcome Measure   Help from another person eating meals?: A Little Help from another person taking care of personal grooming?: A Little Help from another person toileting, which includes using toliet, bedpan, or urinal?: Total Help from another person bathing (including washing, rinsing, drying)?: Total Help from another person to  put on and taking off regular upper body clothing?: A Lot Help from another person to put on and taking off regular lower body clothing?: Total 6 Click Score: 11    End of Session    OT Visit Diagnosis: Muscle weakness (generalized) (M62.81);History of falling (Z91.81);Pain Pain - Right/Left: Left Pain - part of body: Arm   Activity Tolerance Patient limited by pain   Patient Left in bed;with call bell/phone within reach;with bed alarm set   Nurse Communication          Time: 7628-3151 OT Time Calculation (min): 28 min  Charges: OT General Charges $OT Visit: 1 Visit OT Treatments $Therapeutic Activity: 8-22 mins  Myrtie Hawk Angelena Sand, OTR/L 12/25/19, 2:28 PM

## 2019-12-25 NOTE — Progress Notes (Signed)
PROGRESS NOTE    [name]  TDV:[id number] DOB: 1950/09/26 DOA: 12/21/2019 PCP: [name], [name], MD    Assessment & Plan:   Active Problems:   Acute embolic stroke [facility])    [name]  is a 69 y.o. Caucasian female with a known history of multiple medical problems including type 2 diabetes mellitus, hypertension, hypothyroidism, depression and asthma, presented to the emergency room with acute onset of altered mental status with confusion as well as generalized weakness more on the left than the right side in th up extremity.  She admitted to recurrent dizziness.  No chest pain or dyspnea or palpitations.  She has been having paresthesias with her symptoms.  She admitted to expressive dysphasia without dysphagia.  No nausea vomiting or abdominal pain.  No bleeding diathesis.    1.  Acute bilateral likely embolic ischemic infarctions  --presented with generalized weakness and altered mental status with confusion and left-sided hemiparesis.   --MRI of the brain showed bilateral small acute infarcts with the largest being in the right centrum semiovale.  Per neuro, Cardioembolic etiology likely with history of atrial fibrillation.  Patient on home Pradaxa, which has been changed to Eliquis. --MRA shows no evidence of large vessel occlusion.  Carotid US no significant stenosis.  A1c 6.9.  LDL 44 PLAN: --no need for TTE since already on anticogulation --cont Eliquis --SNF rehab --cont statin  2.  Essential hypertension. --on amlodipine, losartan, HCTZ and Toprol at home --cont current regimen  3.  Gout. -We will continue colchicine.  4.  Depression and anxiety. --on Bupropion, Cymbalta, and Xanax PRN at home --cont home regimen  5.  Hypothyroidism. -cont home Synthroid  6.  Type 2 diabetes mellitus with peripheral neuropathy. -A1c 6.9.  Controlled.  On metformin at home --no need for fingersticks. --cont home lyrica  7.  GERD. -We will continue PPI  therapy as well as Carafate.   DVT prophylaxis: GG:YIRSWNI and Pradaxa  Code Status: Full code  Family Communication: husband updated at the bedside, at least 20 minutes, who had lots complaints about everyone and every part of the hospitalization.  Status is: inpatient Dispo:   The patient is from: home Anticipated d/c is to: SNF Anticipated d/c date is: whenever bed available Patient currently is medically stable to d/c.   Subjective and Interval History:  No new complaints.  Talked with husband today who had a lot of complaints.   Objective: Vitals:   12/25/19 0448 12/25/19 0728 12/25/19 0747 12/25/19 1350  BP: (!) 125/57  112/87   Pulse: 65  67   Resp: 20  15   Temp: 97.9 F (36.6 C)  98 F (36.7 C)   TempSrc: Oral  Oral   SpO2: 93% 99% 90% 96%  Weight:      Height:        Intake/Output Summary (Last 24 hours) at 12/25/2019 1517 Last data filed at 12/25/2019 0446 Gross per 24 hour  Intake --  Output 350 ml  Net -350 ml   Filed Weights   12/20/19 1422  Weight: 115.4 kg    Examination:   Constitutional: NAD, AAOx3, working with PT HEENT: conjunctivae and lids normal, EOMI CV: No cyanosis.   RESP: normal respiratory effort, on RA Extremities: small, somewhat deformed-looking legs and feet SKIN: warm, dry and intact Neuro: II - XII grossly intact.     Data Reviewed: I have personally reviewed following labs and imaging studies  CBC: Recent Labs  Lab 12/19/19 1700 12/19/19  2132 12/23/19 0644 12/24/19 0534 12/25/19 0308  WBC 8.3 7.0 9.2 8.1 8.0  NEUTROABS  --  4.7  --   --   --   HGB 9.9* 9.7* 10.3* 9.8* 9.5*  HCT 32.5* 31.6* 30.6* 31.4* 29.4*  MCV 84.4 84.3 78.3* 82.2 81.2  PLT 286 302 308 292 789   Basic Metabolic Panel: Recent Labs  Lab 12/19/19 1700 12/22/19 0712 12/23/19 0644 12/24/19 0534 12/25/19 0308  NA 136  --  135 139 138  K 4.7  --  3.6 4.2 4.6  CL 102  --  99 100 99  CO2 26  --  25 29 29   GLUCOSE 104*  --  144* 179* 201*   BUN 23  --  16 19 25*  CREATININE 1.31* 0.93 0.92 1.06* 1.12*  CALCIUM 8.7*  --  8.3* 8.6* 8.5*  MG  --   --   --  1.6* 2.2   GFR: Estimated Creatinine Clearance (by C-G formula based on SCr of 1.12 mg/dL (H)) Female: 59.3 mL/min (A) Female: 72.1 mL/min (A) Liver Function Tests: Recent Labs  Lab 12/19/19 2132  AST 14*  ALT 10  ALKPHOS 84  BILITOT 0.5  PROT 7.0  ALBUMIN 3.5   No results for input(s): LIPASE, AMYLASE in the last 168 hours. No results for input(s): AMMONIA in the last 168 hours. Coagulation Profile: Recent Labs  Lab 12/23/19 0644  INR 1.3*   Cardiac Enzymes: No results for input(s): CKTOTAL, CKMB, CKMBINDEX, TROPONINI in the last 168 hours. BNP (last 3 results) No results for input(s): PROBNP in the last 8760 hours. HbA1C: Recent Labs    12/23/19 0644  HGBA1C 6.9*   CBG: No results for input(s): GLUCAP in the last 168 hours. Lipid Profile: Recent Labs    12/23/19 0644  CHOL 122  HDL 63  LDLCALC 44  TRIG 77  CHOLHDL 1.9   Thyroid Function Tests: No results for input(s): TSH, T4TOTAL, FREET4, T3FREE, THYROIDAB in the last 72 hours. Anemia Panel: No results for input(s): VITAMINB12, FOLATE, FERRITIN, TIBC, IRON, RETICCTPCT in the last 72 hours. Sepsis Labs: Recent Labs  Lab 12/19/19 1700 12/20/19 0142  LATICACIDVEN 2.0* 1.1    Recent Results (from the past 240 hour(s))  Urine culture     Status: Abnormal   Collection Time: 12/20/19  1:42 AM   Specimen: Urine, Random  Result Value Ref Range Status   Specimen Description   Final    URINE, RANDOM Performed at [facility], [address] [place], [place] [address]    Special Requests   Final    NONE Performed at [facility], [address] [place], [place] [address]    Culture MULTIPLE SPECIES PRESENT, SUGGEST RECOLLECTION (A)  Final   Report Status 12/20/2019 FINAL  Final  Respiratory Panel by RT PCR (Flu A&B, Covid) - Nasopharyngeal Swab     Status: None    Collection Time: 12/23/19  1:13 AM   Specimen: Nasopharyngeal Swab  Result Value Ref Range Status   SARS Coronavirus 2 by RT PCR NEGATIVE NEGATIVE Final    Comment: (NOTE) SARS-CoV-2 target nucleic acids are NOT DETECTED.  The SARS-CoV-2 RNA is generally detectable in upper respiratoy specimens during the acute phase of infection. The lowest concentration of SARS-CoV-2 viral copies this assay can detect is 131 copies/mL. A negative result does not preclude SARS-Cov-2 infection and should not be used as the sole basis for treatment or other patient management decisions. A negative result may  occur with  improper specimen collection/handling, submission of specimen other than nasopharyngeal swab, presence of viral mutation(s) within the areas targeted by this assay, and inadequate number of viral copies (<131 copies/mL). A negative result must be combined with clinical observations, patient history, and epidemiological information. The expected result is Negative.  Fact Sheet for Patients:  PinkCheek.be  Fact Sheet for Healthcare Providers:  GravelBags.it  This test is no t yet approved or cleared by the [place] FDA and  has been authorized for detection and/or diagnosis of SARS-CoV-2 by FDA under an Emergency Use Authorization (EUA). This EUA will remain  in effect (meaning this test can be used) for the duration of the COVID-19 declaration under Section 564(b)(1) of the Act, 21 U.S.C. section 360bbb-3(b)(1), unless the authorization is terminated or revoked sooner.     Influenza A by PCR NEGATIVE NEGATIVE Final   Influenza B by PCR NEGATIVE NEGATIVE Final    Comment: (NOTE) The Xpert Xpress SARS-CoV-2/FLU/RSV assay is intended as an aid in  the diagnosis of influenza from Nasopharyngeal swab specimens and  should not be used as a sole basis for treatment. Nasal washings and  aspirates are unacceptable for Xpert  Xpress SARS-CoV-2/FLU/RSV  testing.  Fact Sheet for Patients: PinkCheek.be  Fact Sheet for Healthcare Providers: GravelBags.it  This test is not yet approved or cleared by the [place] FDA and  has been authorized for detection and/or diagnosis of SARS-CoV-2 by  FDA under an Emergency Use Authorization (EUA). This EUA will remain  in effect (meaning this test can be used) for the duration of the  Covid-19 declaration under Section 564(b)(1) of the Act, 21  U.S.C. section 360bbb-3(b)(1), unless the authorization is  terminated or revoked. Performed at [facility], [address], [place], [place] [address]       Radiology Studies: No results found.   Scheduled Meds:   stroke: mapping our early stages of recovery book   Does not apply Once   amLODipine  5 mg Oral Daily   apixaban  5 mg Oral BID   buPROPion  150 mg Oral Daily   cetirizine  5 mg Oral QPM   darifenacin  7.5 mg Oral Daily   DULoxetine  60 mg Oral Daily   fesoterodine  8 mg Oral Daily   losartan  50 mg Oral Daily   And   hydrochlorothiazide  12.5 mg Oral Daily   ipratropium-albuterol  3 mL Nebulization Q6H   levothyroxine  175 mcg Oral QAC breakfast   metoprolol succinate  50 mg Oral Daily   mometasone-formoterol  2 puff Inhalation BID   montelukast  10 mg Oral QHS   oxybutynin  10 mg Oral QHS   pantoprazole  40 mg Oral Daily   pregabalin  50 mg Oral TID   sucralfate  1 g Oral TID with meals   Continuous Infusions:   LOS: 3 days     Enzo Bi, MD [facility] If 7PM-7AM, please contact night-coverage 12/25/2019, 3:17 PM

## 2019-12-25 NOTE — Progress Notes (Signed)
Pt cancelled appt 12/20/19 husband had pt transported to [facility] via EMS due to lower extremity weakness b/l was dx'ed with stroke  Dr. Olivia Mackie [name]

## 2019-12-25 NOTE — Consult Note (Addendum)
[place] for Eliquis Indication: atrial fibrillation  Allergies  Allergen Reactions  . Morphine And Related Other (See Comments) and Nausea Only    Patient becomes very confused Other reaction(s): Other (See Comments), Other (See Comments) Patient becomes very confused Patient becomes very confused   . Penicillins Hives, Shortness Of Breath, Swelling and Other (See Comments)    Facial swelling Has patient had a PCN reaction causing immediate rash, facial/tongue/throat swelling, SOB or lightheadedness with hypotension: Yes Has patient had a PCN reaction causing severe rash involving mucus membranes or skin necrosis: Yes Has patient had a PCN reaction that required hospitalization No Has patient had a PCN reaction occurring within the last 10 years: No If all of the above answers are "NO", then may proceed with Cephalosporin use.  Other reaction(s): Other (See Comments), Other (See Comments) Facial swelling Has patient had a PCN reaction causing immediate rash, facial/tongue/throat swelling, SOB or lightheadedness with hypotension: Yes Has patient had a PCN reaction causing severe rash involving mucus membranes or skin necrosis: Yes Has patient had a PCN reaction that required hospitalization No Has patient had a PCN reaction occurring within the last 10 years: No If all of the above answers are "NO", then may proceed with Cephalosporin use. Facial swelling Has patient had a PCN reaction causing immediate rash, facial/tongue/throat swelling, SOB or lightheadedness with hypotension: Yes Has patient had a PCN reaction causing severe rash involving mucus membranes or skin necrosis: Yes Has patient had a PCN reaction that required hospitalization No Has patient had a PCN reaction occurring within the last 10 years: No If all of the above answers are "NO", then may proceed with Cephalosporin use. Facial swelling Has patient had a PCN reaction causing  immediate rash, facial/tongue/throat swelling, SOB or lightheadedness with hypotension: Yes Has patient had a PCN reaction causing severe rash involving mucus membranes or skin necrosis: Yes Has patient had a PCN reaction that required hospitalization No Has patient had a PCN reaction occurring within the last 10 years: No If all of the above answers are "NO", then may proceed with Cephalosporin use.   . Ambien [Zolpidem]     Hallucination   . Aspirin Hives  . Oxytetracycline Hives    Other reaction(s): Unknown     Patient Measurements: Height: 5\' 5"  (165.1 cm) Weight: 115.4 kg (254 lb 6.6 oz) IBW/kg (Calculated) : 57   Vital Signs: Temp: 98 F (36.7 C) (10/04 0747) Temp Source: Oral (10/04 0747) BP: 112/87 (10/04 0747) Pulse Rate: 67 (10/04 0747)  Labs: Recent Labs    12/23/19 0644 12/23/19 0644 12/24/19 0534 12/25/19 0308  HGB 10.3*   < > 9.8* 9.5*  HCT 30.6*  --  31.4* 29.4*  PLT 308  --  292 338  LABPROT 16.1*  --   --   --   INR 1.3*  --   --   --   CREATININE 0.92  --  1.06* 1.12*   < > = values in this interval not displayed.    Estimated Creatinine Clearance (by C-G formula based on SCr of 1.12 mg/dL (H)) Female: 59.3 mL/min (A) Female: 72.1 mL/min (A)   Medical History: Past Medical History:  Diagnosis Date  . Abnormal antibody titer   . Anxiety and depression   . Arthritis   . Asthma   . Cystocele   . Depression   . Diabetes ([place])   . DVT (deep venous thrombosis) (H[facility])    Righ calf  .  Dyspnea    11/08/2018 - "more now due to lack of exercise"  . Dysrhythmia    afib  . Edema   . GERD (gastroesophageal reflux disease)   . Heart murmur    Echocardiogram June 2015: Mild MR (possible vegetation seen on TTE, not seen on TEE), normal LV size with moderate concentric LVH. Normal function EF 60-65%. Normal diastolic function. Mild LA dilation.  Marland Kitchen History of IBS   . History of kidney stones   . History of methicillin resistant staphylococcus aureus  (MRSA) 2008  . Hyperlipidemia   . Hypertension   . Hypothyroidism   . Insomnia   . Kidney stone    kidney stones with lithotripsy .  [employer] [facility]- "adrenal glands"  . Neuropathy involving both lower extremities   . Obesity, Class II, BMI 35-39.9, with comorbidity   . Paroxysmal atrial fibrillation ([place]) 2007   In June 2015, Cardiac Event Monitor: Mostly SR/sinus arrhythmia with PVCs that are frequent. Short bursts of A. fib lasting several minutes;; CHA2DS2-VASc Score = 5 (age, Female, PVD, DM, HTN)  . Peripheral vascular disease ([place])   . Sleep apnea    use C-PAP  . Urinary incontinence   . Venous stasis dermatitis of both lower extremities     Medications:  Pradaxa - last dose today AM  Assessment: 69 yo female with strokes "consistent with a cardioembolic source" and hx of PAF.    Pharmacy to change anticoagulation from Pradaxa to Eliquis and monitor per consult.  Goal of Therapy:  Monitor platelets by anticoagulation protocol: Yes   Plan:  Continue Eliquis 5mg  BID. Via telephone patient was counseled on Eliquis, informed her to stop taking Pradaxa when she is discharged, and how to properly dispose of Pradaxa. Patient handout on Eliquis was provided to the patient. Patient verbalized understanding.   Will monitor CBC/SCr a minimum of every 3 days per protocol.  Rowland Lathe, Pharm Clinical Pharmacist 12/25/2019 2:01 PM

## 2019-12-25 NOTE — TOC Progression Note (Signed)
Transition of Care [facility]) - Progression Note    Patient Details  Name: [name] MRN: [id number] Date of Birth: 11/12/1950  Transition of Care [facility]) CM/SW [place], RN Phone Number: 12/25/2019, 2:15 PM  Clinical Narrative:   RNCM met with patient and husband at bedside. Patient talking on phone initially but was awake and verbally appropriate with all questions. RNCM discussed that bed offers would be presented and that beds were offered by [facility], [facility] and [facility]. After some discussion patient reported that she wanted to go to [facility] as she had been there before visiting friends.  RNCM accepted [facility] in the hub and started insurance authorization for both SNF and medical transport through [facility] [employer] [facility] [employer].     Expected Discharge Plan: Skilled Nursing Facility Barriers to Discharge: Continued Medical Work up  Expected Discharge Plan and Services Expected Discharge Plan: [place] In-house Referral: Clinical Social Work   Post Acute Care Choice: [place] arrangements for the past 2 months: [place] Determinants of Health (SDOH) Interventions    Readmission Risk Interventions Readmission Risk Prevention Plan 07/30/2018  Transportation Screening Complete  PCP or Specialist Appt within 3-5 Days Complete  HRI or Home Care Consult Complete  Social Work Consult for [place] Planning/Counseling Patient refused  Palliative Care Screening Patient Refused  Medication Review [employer]) Complete  Some recent data might be hidden

## 2019-12-25 NOTE — Care Management Important Message (Signed)
Important Message  Patient Details  Name: [name] MRN: [id number] Date of Birth: 1951-03-07   Medicare Important Message Given:  Yes     [name] 12/25/2019, 11:10 AM

## 2019-12-25 NOTE — Progress Notes (Signed)
Physical Therapy Treatment Patient Details Name: [name] MRN: [id number] DOB: 05-09-1950 Today's Date: 12/25/2019    History of Present Illness [name] is a 69 y.o. adult with possible history of hypertension, diabetes, chronic pain, COPD, and chronic atrial fibrillation on Pradaxa who presents to the ED complaining of weakness.  History is limited due to patient's occasional confusion.  She reportedly has been increasingly weak at home over the past couple of days with multiple falls.  Patient states she has been doing fine and her husband is able to care for her, however he reported to EMS that he has been unable to do so.  Patient currently only complains of pain to her left forearm from where her husband has been pulling her.    PT Comments    Overlap session with OT for mobility skills due to assist level.  Ptt supine in bed upon arrival.  Stated she is finished with lunch but picks up sherbert and continues to eat.  Assisted but pt wants to try it on her own and places cup on her chest and had difficulty with spoon but persists.  After eating she is assisted to EOB with mod/max a x 2.  Initially requires mod a x 1 to sit but with time and cues is able to progress to close min guard.  She attempts to stand x 3 at EOB with walker support on R UE and HHA on LUE due to continued shoulder pain.  She voices varied reasons why she cannot stand - wrong foot placement and reported pain on the bottom of her foot.  She is unable to assist at all with LUE and tries to pull up on walker with R with walker braced.  Lateral scoot in sitting to get towards [facility] with max a x 1 and returned to supine with max a x 2.  Difficulty with repositioning in bed and requires mod verbal and tactile cues to assist.  OT remained in room after session to continue to address further needs.   SNF remains appropriate given assistance level.  Hoyer lift appropriate for OOB needs if needed.   Follow Up  Recommendations  SNF;Supervision/Assistance - 24 hour     Equipment Recommendations  None recommended by PT    Recommendations for Other Services       Precautions / Restrictions Precautions Precautions: Fall Precaution Comments: L UE pain    Mobility  Bed Mobility Overal bed mobility: Needs Assistance Bed Mobility: Supine to Sit;Sit to Supine     Supine to sit: Mod assist;+2 for physical assistance Sit to supine: Mod assist;+2 for physical assistance      Transfers Overall transfer level: Needs assistance Equipment used: Rolling walker (2 wheeled);1 person hand held assist Transfers: Sit to/from Stand Sit to Stand: Max assist;Total assist;+2 physical assistance;From elevated surface         General transfer comment: unable to raise hips off bed despite 3 attempts  Ambulation/Gait             General Gait Details: unable   Stairs             Wheelchair Mobility    Modified Rankin (Stroke Patients Only)       Balance Overall balance assessment: Needs assistance Sitting-balance support: Feet supported;Single extremity supported Sitting balance-Leahy Scale: Poor Sitting balance - Comments: cue sto sit upright and not rely on staff.  close min gaurd at all times once she is able to sit unsupported  Cognition Arousal/Alertness: Awake/alert Behavior During Therapy: WFL for tasks assessed/performed Overall Cognitive Status: Difficult to assess                                 General Comments: more engaged and very talkative today, somewhat distracted with cues to stay on task      Exercises Other Exercises Other Exercises: sat EOB x 5 minutes    General Comments        Pertinent Vitals/Pain Pain Assessment: Faces Faces Pain Scale: Hurts even more Pain Location: L arm Pain Descriptors / Indicators: Aching;Grimacing;Guarding;Discomfort;Sore Pain Intervention(s): Limited  activity within patient's tolerance;Monitored during session;Repositioned    Home Living                      Prior Function            PT Goals (current goals can now be found in the care plan section) Progress towards PT goals: Progressing toward goals    Frequency    Min 2X/week      PT Plan Current plan remains appropriate    Co-evaluation PT/OT/SLP Co-Evaluation/Treatment: Yes Reason for Co-Treatment: Complexity of the patient's impairments (multi-system involvement) PT goals addressed during session: Mobility/safety with mobility OT goals addressed during session: ADL's and self-care;Strengthening/ROM      AM-PAC PT "6 Clicks" Mobility   Outcome Measure  Help needed turning from your back to your side while in a flat bed without using bedrails?: Total Help needed moving from lying on your back to sitting on the side of a flat bed without using bedrails?: Total Help needed moving to and from a bed to a chair (including a wheelchair)?: Total Help needed standing up from a chair using your arms (e.g., wheelchair or bedside chair)?: Total Help needed to walk in hospital room?: Total Help needed climbing 3-5 steps with a railing? : Total 6 Click Score: 6    End of Session Equipment Utilized During Treatment: Gait belt Activity Tolerance: Patient tolerated treatment well Patient left: in bed;with call bell/phone within reach;with bed alarm set;with family/visitor present Nurse Communication: Mobility status Pain - Right/Left: Left Pain - part of body: Arm     Time: 1245-1309 PT Time Calculation (min) (ACUTE ONLY): 24 min  Charges:  $Therapeutic Activity: 8-22 mins                    Chesley Noon, PTA 12/25/19, 1:56 PM

## 2019-12-26 ENCOUNTER — Telehealth: Payer: Self-pay

## 2019-12-26 ENCOUNTER — Other Ambulatory Visit: Payer: Self-pay | Admitting: Internal Medicine

## 2019-12-26 DIAGNOSIS — E1159 Type 2 diabetes mellitus with other circulatory complications: Secondary | ICD-10-CM | POA: Insufficient documentation

## 2019-12-26 DIAGNOSIS — I152 Hypertension secondary to endocrine disorders: Secondary | ICD-10-CM | POA: Insufficient documentation

## 2019-12-26 DIAGNOSIS — E119 Type 2 diabetes mellitus without complications: Secondary | ICD-10-CM

## 2019-12-26 MED ORDER — IPRATROPIUM-ALBUTEROL 0.5-2.5 (3) MG/3ML IN SOLN
3.0000 mL | Freq: Three times a day (TID) | RESPIRATORY_TRACT | Status: DC
  Administered 2019-12-26 (×2): 3 mL via RESPIRATORY_TRACT
  Filled 2019-12-26 (×2): qty 3

## 2019-12-26 MED ORDER — SITAGLIPTIN PHOSPHATE 25 MG PO TABS: 25.0000 mg | ORAL_TABLET | Freq: Every day | ORAL | 3 refills | Status: DC

## 2019-12-26 MED ORDER — BUPROPION HCL ER (XL) 150 MG PO TB24
300.0000 mg | ORAL_TABLET | Freq: Every day | ORAL | Status: DC
  Administered 2019-12-27: 300 mg via ORAL
  Filled 2019-12-26: qty 2

## 2019-12-26 MED ORDER — METFORMIN HCL 500 MG PO TABS: 500.0000 mg | ORAL_TABLET | Freq: Two times a day (BID) | ORAL | 3 refills | Status: DC

## 2019-12-26 NOTE — Progress Notes (Signed)
Physical Therapy Treatment Patient Details Name: [name] MRN: [id number] DOB: 07-02-1950 Today's Date: 12/26/2019    History of Present Illness [name] is a 69 y.o. adult with possible history of hypertension, diabetes, chronic pain, COPD, and chronic atrial fibrillation on Pradaxa who presents to the ED complaining of weakness.  History is limited due to patient's occasional confusion.  She reportedly has been increasingly weak at home over the past couple of days with multiple falls.  Patient states she has been doing fine and her husband is able to care for her, however he reported to EMS that he has been unable to do so.  Patient currently only complains of pain to her left forearm from where her husband has been pulling her.    PT Comments    Participated in exercises as described below.  Pt improved AROM at fingers/wrist/elbow today with increased ROM and speed but remains quite limited.  No AROM at shoulder due to pain.  She is assisted to EOB with heavy mod a x 2.  Sitting improved today but continues to require min guard at all times.  Attempted to stand x 3 at EOB but pt is unable and after 1 attempt seems to put less effort in attempts and gives up quickly.  Care not to pull on L UE and only support which limits assistance available to give.  Returned to supine with max a x 2.  Pt inc of urine and full bed change with tech assisted.  Pt initially scheduled 2 x week.  Will increase to 7x per week as she is able to tolerate activity but remains quite limited.  SNF remains appropriate for discharge.   Follow Up Recommendations  SNF     Equipment Recommendations  None recommended by PT    Recommendations for Other Services       Precautions / Restrictions Precautions Precautions: Fall Precaution Comments: L UE pain Restrictions Weight Bearing Restrictions: No    Mobility  Bed Mobility Overal bed mobility: Needs Assistance Bed Mobility: Supine to Sit;Sit to  Supine     Supine to sit: Mod assist;+2 for physical assistance Sit to supine: Mod assist;+2 for physical assistance   General bed mobility comments: due to patient's inability to use L UE she requires extensive assist for basic mobility  Transfers Overall transfer level: Needs assistance Equipment used: Rolling walker (2 wheeled);1 person hand held assist   Sit to Stand: Max assist;Total assist;+2 physical assistance;From elevated surface         General transfer comment: unable to raise hips off bed despite 3 attempts  Ambulation/Gait             General Gait Details: unable   Stairs             Wheelchair Mobility    Modified Rankin (Stroke Patients Only)       Balance Overall balance assessment: Needs assistance Sitting-balance support: Feet supported;Single extremity supported Sitting balance-Leahy Scale: Poor Sitting balance - Comments: improved sitting today with less guarding needed but given LUE deficits she would be unable to recover from imbalances on her own                                    Cognition Arousal/Alertness: Awake/alert Behavior During Therapy: [facility] for tasks assessed/performed Overall Cognitive Status: Within Functional Limits for tasks assessed  Exercises Other Exercises Other Exercises: sat EOB x 5 minutes Other Exercises: provided PROM to LUE Other Exercises: BLE AAROM supine ranges x 10    General Comments        Pertinent Vitals/Pain Pain Assessment: Faces Faces Pain Scale: Hurts whole lot Pain Location: L arm Pain Descriptors / Indicators: Aching;Grimacing;Guarding;Discomfort;Sore    Home Living                      Prior Function            PT Goals (current goals can now be found in the care plan section) Progress towards PT goals: Progressing toward goals    Frequency    7X/week      PT Plan Frequency needs to be  updated    Co-evaluation              AM-PAC PT "6 Clicks" Mobility   Outcome Measure  Help needed turning from your back to your side while in a flat bed without using bedrails?: Total Help needed moving from lying on your back to sitting on the side of a flat bed without using bedrails?: Total Help needed moving to and from a bed to a chair (including a wheelchair)?: Total Help needed standing up from a chair using your arms (e.g., wheelchair or bedside chair)?: Total Help needed to walk in hospital room?: Total Help needed climbing 3-5 steps with a railing? : Total 6 Click Score: 6    End of Session Equipment Utilized During Treatment: Gait belt Activity Tolerance: Patient tolerated treatment well Patient left: in bed;with call bell/phone within reach;with bed alarm set Nurse Communication: Mobility status Pain - Right/Left: Left Pain - part of body: Arm     Time: 7673-4193 PT Time Calculation (min) (ACUTE ONLY): 31 min  Charges:  $Therapeutic Exercise: 8-22 mins $Therapeutic Activity: 8-22 mins                    Chesley Noon, PTA 12/26/19, 10:52 AM

## 2019-12-26 NOTE — Telephone Encounter (Signed)
I want to change metformin to [place] for now given kidney function fluctuating though its improved can do 1x per day metformin with [place] Inform husband  Sorry to hear she had had a stroke on the MRI wish her well in her recovery   [place]

## 2019-12-26 NOTE — TOC Progression Note (Signed)
Transition of Care [facility]) - Progression Note    Patient Details  Name: NANNETTE ZILL MRN: [id number] Date of Birth: 1950-09-27  Transition of [facility]) CM/SW Contact  Shelbie Ammons, RN Phone Number: 12/26/2019, 2:35 PM  Clinical Narrative:   RNCM received call from [facility] with [facility], they are approving the EMS transport for patient with approval number of [id number] but they are initially denying SNF stay. Peer to Peer is offered with Dr. Amalia Hailey at [phone]. Dr. Billie Ruddy made aware.     Expected Discharge Plan: Skilled Nursing Facility Barriers to Discharge: Continued Medical Work up  Expected Discharge Plan and Services Expected Discharge Plan: [place] In-house Referral: Clinical Social Work   Post Acute Care Choice: [place] arrangements for the past 2 months: [place] Determinants of Health (SDOH) Interventions    Readmission Risk Interventions Readmission Risk Prevention Plan 07/30/2018  Transportation Screening Complete  PCP or Specialist Appt within 3-5 Days Complete  HRI or Home Care Consult Complete  Social Work Consult for [place] Planning/Counseling Patient refused  Palliative Care Screening Patient Refused  Medication Review Press photographer) Complete  Some recent data might be hidden

## 2019-12-26 NOTE — Telephone Encounter (Signed)
Pt's husband [name] would like a call back about change of wife's care at the hospital. He is on the DPR-he is very concerned about his wife and feels as if he is not getting answers from anyone at the hospital. Please call back to advise at your earliest convenience.

## 2019-12-26 NOTE — Progress Notes (Signed)
PROGRESS NOTE    [name]  HFW:[id number] DOB: Jan 19, 1951 DOA: 12/21/2019 PCP: [name], [name], MD    Assessment & Plan:   Principal Problem:   Acute embolic stroke [facility]) Active Problems:   Paroxysmal atrial fibrillation ([employer]); CHA2DS2-Vasc Score 5. On Pradaxa   Diabetic peripheral neuropathy associated with type 2 diabetes mellitus ([place])   Morbid obesity with BMI of 40.0-44.9, adult (HCC)   Chronic pain syndrome   Chronic anticoagulation (Pradaxa)    [name]  is a 69 y.o. Caucasian female with a known history of multiple medical problems including type 2 diabetes mellitus, hypertension, hypothyroidism, depression and asthma, presented to the emergency room with acute onset of altered mental status with confusion as well as generalized weakness more on the left than the right side in th up extremity.  She admitted to recurrent dizziness.  No chest pain or dyspnea or palpitations.  She has been having paresthesias with her symptoms.  She admitted to expressive dysphasia without dysphagia.  No nausea vomiting or abdominal pain.  No bleeding diathesis.    1.  Acute bilateral likely embolic ischemic infarctions  --presented with generalized weakness and altered mental status with confusion and left-sided hemiparesis.   --MRI of the brain showed bilateral small acute infarcts with the largest being in the right centrum semiovale.  Per neuro, Cardioembolic etiology likely with history of atrial fibrillation.  Patient on home Pradaxa, which has been changed to Eliquis. --MRA showed no evidence of large vessel occlusion.  Carotid US no significant stenosis.  A1c 6.9.  LDL 44 --per neuro, no need for TTE since already on anticoagulation. PLAN: --cont Eliquis --cont statin --peer-to-peer appeal with insurance company physician today to approve acute SNF rehab, successful.  2.  Essential hypertension. --on amlodipine, losartan, HCTZ and Toprol at home --cont current  regimen  3.  Gout. -We will continue colchicine.  4.  Depression and anxiety. --on Bupropion, Cymbalta, and Xanax PRN at home --cont home regimen  5.  Hypothyroidism. -cont home Synthroid  6.  Type 2 diabetes mellitus with peripheral neuropathy. -A1c 6.9.  Controlled.  On metformin and JANUVIA at home PLAN: --no need for fingersticks. --cont home lyrica --Hold metformin and JANUVIA while inpatient  7.  GERD. -We will continue PPI therapy as well as Carafate.  # Urinary incontinence --cont home oxybutynin  # Chronic pain on chronic opioids --cont home Norco PRN  # anxiety  --cont home Xanax PRN  # Pressure injury left foot, unstageable, POA # Pressure injury right heal, stage 1, POA   No need for daily labs or tele.   DVT prophylaxis: OY:DXAJOIN Code Status: Full code  Family Communication: husband updated at the bedside, at least 20 minutes on 10/4, who had lots complaints about everyone and every part of the hospitalization.  All questions answered, management plans explained, told husband we are only waiting on SNF bed.    Status is: inpatient Dispo:   The patient is from: home Anticipated d/c is to: SNF Anticipated d/c date is: whenever bed available Patient currently is medically stable to d/c.   Subjective and Interval History:  No new complaints.  Feeling better.  Had to call for peer-to-peer appeal to insurance company to approve her SNF rehab; initial denial reason was that pt has low likelihood of improvement and therefore more appropriate for long-term SNF.  I argued that pt came from home, had documented new stroke, met acute rehab criteria, and should be given a chance at acute  rehab first.  Appeal successful.   Objective: Vitals:   12/26/19 0745 12/26/19 0754 12/26/19 1450 12/26/19 1541  BP: (!) 157/66   129/68  Pulse: 67 65 66 69  Resp: _0 Temp: 98.5 F (36.9 C)   97.9 F (36.6 C)  TempSrc: Oral     SpO2: 95% 95% 93% 97%   Weight:      Height:        Intake/Output Summary (Last 24 hours) at 12/26/2019 1633 Last data filed at 12/26/2019 1054 Gross per 24 hour  Intake --  Output 1100 ml  Net -1100 ml   Filed Weights   12/20/19 1422  Weight: 115.4 kg    Examination:   Constitutional: NAD, AAOx3 HEENT: conjunctivae and lids normal, EOMI CV: No cyanosis.   RESP: normal respiratory effort, on RA Extremities: small and somewhat deformed-looking legs and feet. SKIN: warm, dry and intact Neuro: II - XII grossly intact.   Psych: better mood and affect.  Appropriate judgement and reason    Data Reviewed: I have personally reviewed following labs and imaging studies  CBC: Recent Labs  Lab 12/19/19 1700 12/19/19 2132 12/23/19 0644 12/24/19 0534 12/25/19 0308  WBC 8.3 7.0 9.2 8.1 8.0  NEUTROABS  --  4.7  --   --   --   HGB 9.9* 9.7* 10.3* 9.8* 9.5*  HCT 32.5* 31.6* 30.6* 31.4* 29.4*  MCV 84.4 84.3 78.3* 82.2 81.2  PLT 286 302 308 292 161   Basic Metabolic Panel: Recent Labs  Lab 12/19/19 1700 12/22/19 0712 12/23/19 0644 12/24/19 0534 12/25/19 0308  NA 136  --  135 139 138  K 4.7  --  3.6 4.2 4.6  CL 102  --  99 100 99  CO2 26  --  _1 GLUCOSE 104*  --  144* 179* 201*  BUN 23  --  16 19 25*  CREATININE 1.31* 0.93 0.92 1.06* 1.12*  CALCIUM 8.7*  --  8.3* 8.6* 8.5*  MG  --   --   --  1.6* 2.2   GFR: Estimated Creatinine Clearance (by C-G formula based on SCr of 1.12 mg/dL (H)) Female: 59.3 mL/min (A) Female: 72.1 mL/min (A) Liver Function Tests: Recent Labs  Lab 12/19/19 2132  AST 14*  ALT 10  ALKPHOS 84  BILITOT 0.5  PROT 7.0  ALBUMIN 3.5   No results for input(s): LIPASE, AMYLASE in the last 168 hours. No results for input(s): AMMONIA in the last 168 hours. Coagulation Profile: Recent Labs  Lab 12/23/19 0644  INR 1.3*   Cardiac Enzymes: No results for input(s): CKTOTAL, CKMB, CKMBINDEX, TROPONINI in the last 168 hours. BNP (last 3 results) No results for  input(s): PROBNP in the last 8760 hours. HbA1C: No results for input(s): HGBA1C in the last 72 hours. CBG: No results for input(s): GLUCAP in the last 168 hours. Lipid Profile: No results for input(s): CHOL, HDL, LDLCALC, TRIG, CHOLHDL, LDLDIRECT in the last 72 hours. Thyroid Function Tests: No results for input(s): TSH, T4TOTAL, FREET4, T3FREE, THYROIDAB in the last 72 hours. Anemia Panel: No results for input(s): VITAMINB12, FOLATE, FERRITIN, TIBC, IRON, RETICCTPCT in the last 72 hours. Sepsis Labs: Recent Labs  Lab 12/19/19 1700 12/20/19 0142  LATICACIDVEN 2.0* 1.1    Recent Results (from the past 240 hour(s))  Urine culture     Status: Abnormal   Collection Time: 12/20/19  1:42 AM   Specimen: Urine, Random  Result Value Ref Range Status  Specimen Description   Final    URINE, RANDOM Performed at [facility], [address] [place], [place] [address]    Special Requests   Final    NONE Performed at [facility], [address] [place], [place] [address]    Culture MULTIPLE SPECIES PRESENT, SUGGEST RECOLLECTION (A)  Final   Report Status 12/20/2019 FINAL  Final  Respiratory Panel by RT PCR (Flu A&B, Covid) - Nasopharyngeal Swab     Status: None   Collection Time: 12/23/19  1:13 AM   Specimen: Nasopharyngeal Swab  Result Value Ref Range Status   SARS Coronavirus 2 by RT PCR NEGATIVE NEGATIVE Final    Comment: (NOTE) SARS-CoV-2 target nucleic acids are NOT DETECTED.  The SARS-CoV-2 RNA is generally detectable in upper respiratoy specimens during the acute phase of infection. The lowest concentration of SARS-CoV-2 viral copies this assay can detect is 131 copies/mL. A negative result does not preclude SARS-Cov-2 infection and should not be used as the sole basis for treatment or other patient management decisions. A negative result may occur with  improper specimen collection/handling, submission of specimen other than nasopharyngeal swab,  presence of viral mutation(s) within the areas targeted by this assay, and inadequate number of viral copies (<131 copies/mL). A negative result must be combined with clinical observations, patient history, and epidemiological information. The expected result is Negative.  Fact Sheet for Patients:  PinkCheek.be  Fact Sheet for Healthcare Providers:  GravelBags.it  This test is no t yet approved or cleared by the [place] FDA and  has been authorized for detection and/or diagnosis of SARS-CoV-2 by FDA under an Emergency Use Authorization (EUA). This EUA will remain  in effect (meaning this test can be used) for the duration of the COVID-19 declaration under Section 564(b)(1) of the Act, 21 U.S.C. section 360bbb-3(b)(1), unless the authorization is terminated or revoked sooner.     Influenza A by PCR NEGATIVE NEGATIVE Final   Influenza B by PCR NEGATIVE NEGATIVE Final    Comment: (NOTE) The Xpert Xpress SARS-CoV-2/FLU/RSV assay is intended as an aid in  the diagnosis of influenza from Nasopharyngeal swab specimens and  should not be used as a sole basis for treatment. Nasal washings and  aspirates are unacceptable for Xpert Xpress SARS-CoV-2/FLU/RSV  testing.  Fact Sheet for Patients: PinkCheek.be  Fact Sheet for Healthcare Providers: GravelBags.it  This test is not yet approved or cleared by the [place] FDA and  has been authorized for detection and/or diagnosis of SARS-CoV-2 by  FDA under an Emergency Use Authorization (EUA). This EUA will remain  in effect (meaning this test can be used) for the duration of the  Covid-19 declaration under Section 564(b)(1) of the Act, 21  U.S.C. section 360bbb-3(b)(1), unless the authorization is  terminated or revoked. Performed at [facility], [address] [place], [place] [address]        Radiology Studies: No results found.   Scheduled Meds: .  stroke: mapping our early stages of recovery book   Does not apply Once  . amLODipine  5 mg Oral Daily  . apixaban  5 mg Oral BID  . [START ON 12/27/2019] buPROPion  300 mg Oral Daily  . cetirizine  5 mg Oral QPM  . darifenacin  7.5 mg Oral Daily  . DULoxetine  60 mg Oral Daily  . fesoterodine  8 mg Oral Daily  . losartan  50 mg Oral Daily   And  . hydrochlorothiazide  12.5  mg Oral Daily  . ipratropium-albuterol  3 mL Nebulization TID  . levothyroxine  175 mcg Oral QAC breakfast  . metoprolol succinate  50 mg Oral Daily  . mometasone-formoterol  2 puff Inhalation BID  . montelukast  10 mg Oral QHS  . oxybutynin  10 mg Oral QHS  . pantoprazole  40 mg Oral Daily  . pregabalin  50 mg Oral TID  . sucralfate  1 g Oral TID with meals   Continuous Infusions:   LOS: 4 days     Enzo Bi, MD [facility] If 7PM-7AM, please contact night-coverage 12/26/2019, 4:33 PM

## 2019-12-27 ENCOUNTER — Ambulatory Visit: Payer: PPO

## 2019-12-27 ENCOUNTER — Telehealth: Payer: Self-pay | Admitting: Internal Medicine

## 2019-12-27 DIAGNOSIS — N390 Urinary tract infection, site not specified: Secondary | ICD-10-CM | POA: Diagnosis not present

## 2019-12-27 DIAGNOSIS — M542 Cervicalgia: Secondary | ICD-10-CM | POA: Diagnosis not present

## 2019-12-27 DIAGNOSIS — M109 Gout, unspecified: Secondary | ICD-10-CM | POA: Diagnosis not present

## 2019-12-27 DIAGNOSIS — Z7901 Long term (current) use of anticoagulants: Secondary | ICD-10-CM | POA: Diagnosis not present

## 2019-12-27 DIAGNOSIS — M6281 Muscle weakness (generalized): Secondary | ICD-10-CM | POA: Diagnosis not present

## 2019-12-27 DIAGNOSIS — E1140 Type 2 diabetes mellitus with diabetic neuropathy, unspecified: Secondary | ICD-10-CM | POA: Diagnosis not present

## 2019-12-27 DIAGNOSIS — Z6841 Body Mass Index (BMI) 40.0 and over, adult: Secondary | ICD-10-CM

## 2019-12-27 DIAGNOSIS — E1151 Type 2 diabetes mellitus with diabetic peripheral angiopathy without gangrene: Secondary | ICD-10-CM | POA: Diagnosis not present

## 2019-12-27 DIAGNOSIS — Z7984 Long term (current) use of oral hypoglycemic drugs: Secondary | ICD-10-CM | POA: Diagnosis not present

## 2019-12-27 DIAGNOSIS — N3281 Overactive bladder: Secondary | ICD-10-CM | POA: Diagnosis not present

## 2019-12-27 DIAGNOSIS — G8194 Hemiplegia, unspecified affecting left nondominant side: Secondary | ICD-10-CM | POA: Diagnosis not present

## 2019-12-27 DIAGNOSIS — E1142 Type 2 diabetes mellitus with diabetic polyneuropathy: Secondary | ICD-10-CM | POA: Diagnosis not present

## 2019-12-27 DIAGNOSIS — G894 Chronic pain syndrome: Secondary | ICD-10-CM | POA: Diagnosis not present

## 2019-12-27 DIAGNOSIS — G4700 Insomnia, unspecified: Secondary | ICD-10-CM | POA: Diagnosis not present

## 2019-12-27 DIAGNOSIS — R011 Cardiac murmur, unspecified: Secondary | ICD-10-CM | POA: Diagnosis not present

## 2019-12-27 DIAGNOSIS — I480 Paroxysmal atrial fibrillation: Secondary | ICD-10-CM | POA: Diagnosis not present

## 2019-12-27 DIAGNOSIS — I10 Essential (primary) hypertension: Secondary | ICD-10-CM | POA: Diagnosis not present

## 2019-12-27 DIAGNOSIS — M7582 Other shoulder lesions, left shoulder: Secondary | ICD-10-CM | POA: Diagnosis not present

## 2019-12-27 DIAGNOSIS — I69398 Other sequelae of cerebral infarction: Secondary | ICD-10-CM | POA: Diagnosis not present

## 2019-12-27 DIAGNOSIS — I69320 Aphasia following cerebral infarction: Secondary | ICD-10-CM | POA: Diagnosis not present

## 2019-12-27 DIAGNOSIS — J45909 Unspecified asthma, uncomplicated: Secondary | ICD-10-CM | POA: Diagnosis not present

## 2019-12-27 DIAGNOSIS — K219 Gastro-esophageal reflux disease without esophagitis: Secondary | ICD-10-CM | POA: Diagnosis not present

## 2019-12-27 DIAGNOSIS — D649 Anemia, unspecified: Secondary | ICD-10-CM | POA: Diagnosis not present

## 2019-12-27 DIAGNOSIS — E559 Vitamin D deficiency, unspecified: Secondary | ICD-10-CM | POA: Diagnosis not present

## 2019-12-27 DIAGNOSIS — F329 Major depressive disorder, single episode, unspecified: Secondary | ICD-10-CM | POA: Diagnosis not present

## 2019-12-27 DIAGNOSIS — F419 Anxiety disorder, unspecified: Secondary | ICD-10-CM | POA: Diagnosis not present

## 2019-12-27 DIAGNOSIS — J309 Allergic rhinitis, unspecified: Secondary | ICD-10-CM | POA: Diagnosis not present

## 2019-12-27 DIAGNOSIS — I679 Cerebrovascular disease, unspecified: Secondary | ICD-10-CM | POA: Diagnosis not present

## 2019-12-27 DIAGNOSIS — M19012 Primary osteoarthritis, left shoulder: Secondary | ICD-10-CM | POA: Diagnosis not present

## 2019-12-27 DIAGNOSIS — R279 Unspecified lack of coordination: Secondary | ICD-10-CM | POA: Diagnosis not present

## 2019-12-27 DIAGNOSIS — G4733 Obstructive sleep apnea (adult) (pediatric): Secondary | ICD-10-CM | POA: Diagnosis not present

## 2019-12-27 DIAGNOSIS — R531 Weakness: Secondary | ICD-10-CM | POA: Diagnosis not present

## 2019-12-27 DIAGNOSIS — E039 Hypothyroidism, unspecified: Secondary | ICD-10-CM | POA: Diagnosis not present

## 2019-12-27 DIAGNOSIS — M79602 Pain in left arm: Secondary | ICD-10-CM | POA: Diagnosis not present

## 2019-12-27 LAB — SARS CORONAVIRUS 2 BY RT PCR (HOSPITAL ORDER, PERFORMED IN ~~LOC~~ HOSPITAL LAB): SARS Coronavirus 2: NEGATIVE

## 2019-12-27 MED ORDER — IPRATROPIUM-ALBUTEROL 0.5-2.5 (3) MG/3ML IN SOLN: 3.0000 mL | Freq: Four times a day (QID) | RESPIRATORY_TRACT | Status: DC | PRN

## 2019-12-27 MED ORDER — PREGABALIN 50 MG PO CAPS: 50.0000 mg | ORAL_CAPSULE | Freq: Three times a day (TID) | ORAL | 5 refills | Status: DC

## 2019-12-27 MED ORDER — APIXABAN 5 MG PO TABS: 5.0000 mg | ORAL_TABLET | Freq: Two times a day (BID) | ORAL | 0 refills | Status: DC

## 2019-12-27 MED ORDER — HYDROCODONE-ACETAMINOPHEN 5-325 MG PO TABS: 1.0000 | ORAL_TABLET | Freq: Three times a day (TID) | ORAL | 0 refills | Status: DC | PRN

## 2019-12-27 MED ORDER — LACTULOSE 10 GM/15ML PO SOLN
20.0000 g | Freq: Once | ORAL | Status: AC
  Administered 2019-12-27: 20 g via ORAL
  Filled 2019-12-27: qty 30

## 2019-12-27 NOTE — Consult Note (Signed)
[place] for Eliquis Indication: atrial fibrillation  Allergies  Allergen Reactions  . Morphine And Related Other (See Comments) and Nausea Only    Patient becomes very confused Other reaction(s): Other (See Comments), Other (See Comments) Patient becomes very confused Patient becomes very confused   . Penicillins Hives, Shortness Of Breath, Swelling and Other (See Comments)    Facial swelling Has patient had a PCN reaction causing immediate rash, facial/tongue/throat swelling, SOB or lightheadedness with hypotension: Yes Has patient had a PCN reaction causing severe rash involving mucus membranes or skin necrosis: Yes Has patient had a PCN reaction that required hospitalization No Has patient had a PCN reaction occurring within the last 10 years: No If all of the above answers are "NO", then may proceed with Cephalosporin use.  Other reaction(s): Other (See Comments), Other (See Comments) Facial swelling Has patient had a PCN reaction causing immediate rash, facial/tongue/throat swelling, SOB or lightheadedness with hypotension: Yes Has patient had a PCN reaction causing severe rash involving mucus membranes or skin necrosis: Yes Has patient had a PCN reaction that required hospitalization No Has patient had a PCN reaction occurring within the last 10 years: No If all of the above answers are "NO", then may proceed with Cephalosporin use. Facial swelling Has patient had a PCN reaction causing immediate rash, facial/tongue/throat swelling, SOB or lightheadedness with hypotension: Yes Has patient had a PCN reaction causing severe rash involving mucus membranes or skin necrosis: Yes Has patient had a PCN reaction that required hospitalization No Has patient had a PCN reaction occurring within the last 10 years: No If all of the above answers are "NO", then may proceed with Cephalosporin use. Facial swelling Has patient had a PCN reaction causing  immediate rash, facial/tongue/throat swelling, SOB or lightheadedness with hypotension: Yes Has patient had a PCN reaction causing severe rash involving mucus membranes or skin necrosis: Yes Has patient had a PCN reaction that required hospitalization No Has patient had a PCN reaction occurring within the last 10 years: No If all of the above answers are "NO", then may proceed with Cephalosporin use.   . Ambien [Zolpidem]     Hallucination   . Aspirin Hives  . Oxytetracycline Hives    Other reaction(s): Unknown     Patient Measurements: Height: 5\' 5"  (165.1 cm) Weight: 115.4 kg (254 lb 6.6 oz) IBW/kg (Calculated) : 57   Vital Signs: Temp: 97.8 F (36.6 C) (10/06 0731) Temp Source: Oral (10/06 0731) BP: 118/63 (10/06 0731) Pulse Rate: 64 (10/06 0731)  Labs: Recent Labs    12/25/19 0308  HGB 9.5*  HCT 29.4*  PLT 338  CREATININE 1.12*    Estimated Creatinine Clearance (by C-G formula based on SCr of 1.12 mg/dL (H)) Female: 59.3 mL/min (A) Female: 72.1 mL/min (A)   Medical History: Past Medical History:  Diagnosis Date  . Abnormal antibody titer   . Anxiety and depression   . Arthritis   . Asthma   . Cystocele   . Depression   . Diabetes ([place])   . DVT (deep venous thrombosis) (HCC)    Righ calf  . Dyspnea    11/08/2018 - "more now due to lack of exercise"  . Dysrhythmia    afib  . Edema   . GERD (gastroesophageal reflux disease)   . Heart murmur    Echocardiogram June 2015: Mild MR (possible vegetation seen on TTE, not seen on TEE), normal LV size with moderate concentric LVH. Normal function  EF 60-65%. Normal diastolic function. Mild LA dilation.  [place] Kitchen History of IBS   . History of kidney stones   . History of methicillin resistant staphylococcus aureus (MRSA) 2008  . Hyperlipidemia   . Hypertension   . Hypothyroidism   . Insomnia   . Kidney stone    kidney stones with lithotripsy .  [facility]- "adrenal glands"  . Neuropathy involving both lower  extremities   . Obesity, Class II, BMI 35-39.9, with comorbidity   . Paroxysmal atrial fibrillation ([place]) 2007   In June 2015, Cardiac Event Monitor: Mostly SR/sinus arrhythmia with PVCs that are frequent. Short bursts of A. fib lasting several minutes;; CHA2DS2-VASc Score = 5 (age, Female, PVD, DM, HTN)  . Peripheral vascular disease ([place])   . Sleep apnea    use C-PAP  . Urinary incontinence   . Venous stasis dermatitis of both lower extremities     Medications:  Pradaxa - last dose today AM  Assessment: 69 yo female with strokes "consistent with a cardioembolic source" and hx of PAF.    Pharmacy to change anticoagulation from Pradaxa to Eliquis and monitor per consult.  Goal of Therapy:  Monitor platelets by anticoagulation protocol: Yes   Plan:  Continue Eliquis 5mg  BID. Patient was counseled on 10/4 and provided a handout about Eliquis. Patient verbalized understanding. At this time, pharmacy will sign-off from consult.   Will monitor CBC/SCr a minimum of every 3 days per [facility] policy.  Rowland Lathe, Pharm Clinical Pharmacist 12/27/2019 8:52 AM

## 2019-12-27 NOTE — Discharge Summary (Signed)
Physician Discharge Summary  Patient ID: [name] MRN: [id number] DOB/AGE: August 22, 1950 69 y.o.  Admit date: 12/21/2019 Discharge date: 12/27/2019  Admission Diagnoses:  Discharge Diagnoses:  Principal Problem:   Acute embolic stroke [facility]) Active Problems:   Paroxysmal atrial fibrillation ([place]); CHA2DS2-Vasc Score 5. On Pradaxa   Diabetic peripheral neuropathy associated with type 2 diabetes mellitus ([place])   Morbid obesity with BMI of 40.0-44.9, adult (HCC)   Chronic pain syndrome   Chronic anticoagulation ([place])   Discharged Condition: [place] Hospital Course:  NancyRobertsis a29 y.o.Caucasian femalewith a known history ofmultiple medical problems including type 2 diabetes mellitus, hypertension, hypothyroidism, depression and asthma, presented to the emergency room with acute onset of altered mental status with confusion as well as generalized weakness more on the left than the right side in th up extremity. She admitted to recurrent dizziness. No chest pain or dyspnea or palpitations. She has been having paresthesias with her symptoms. She admitted to expressive dysphasia without dysphagia. No nausea vomiting or abdominal pain. No bleeding diathesis.   #1.  Acute bilateral embolic strokes. Paroxysmal atrial fibrillation. MRI of the brain showed bilateral small multiple acute strokes suggesting embolic etiology.  Patient was on chronically on Pradaxa, which was switched to Eliquis. MRI of the brain did not show any large vessel occlusion, carotid ultrasound no signal stenosis.  LDL 44. At discharge, will continue Eliquis and statin.  Patient has been approved for nursing home placement.  #2  Essential hypertension. Resume all home medicines.  #3.  Type 2 diabetes with peripheral neuropathy. Hemoglobin A1c 6.9.  Continue home medicines.  #4.  Morbid obesity.  5.  Chronic pain disorder.  Patient has been flagged for high risk of mortality in 12 months.patient  has been receiving maximal treatment for all her medical conditions.  Palliative care is consulted.  Multiple attempts was made to call patient husband [name].  No options to leave a message.  Consults: neurology  Significant Diagnostic Studies:   MRI HEAD WITHOUT CONTRAST  TECHNIQUE: Multiplanar, multiecho pulse sequences of the brain and surrounding structures were obtained without intravenous contrast.  COMPARISON:  Head CT yesterday.  Brain MRI 01/14/2005.  FINDINGS: Brain: There are 4 small foci of restricted diffusion identified in the bilateral cerebral hemispheres. The largest measures about 10 mm in the posterior right centrum semiovale (series 7, image 16). There are also punctate foci of cortical restricted diffusion in the contralateral left parietal lobe, right lateral parietal lobe, left occipital lobe.  Mild if any associated T2 and FLAIR hyperintensity in those areas with no acute hemorrhage or mass effect. Patchy and confluent bilateral cerebral white matter T2 and FLAIR hyperintensity plus occasional small areas of cortical encephalomalacia (right parietal lobe series 15, image 36), dilated perivascular spaces throughout the deep gray nuclei, probably several chronic deep gray nuclei lacunar infarcts, chronic lacune of the central pons, and a small number of chronic microhemorrhages in the deep gray nuclei (thalami series 13, images 30 and 31).  No superimposed midline shift, mass effect, evidence of mass lesion, ventriculomegaly, extra-axial collection or acute intracranial hemorrhage. Cervicomedullary junction and pituitary are within normal limits.  Vascular: Major intracranial vascular flow voids are stable since 2006.  Skull and upper cervical spine: Negative visible cervical spine. Bone marrow signal appears stable since 2006 and within normal limits.  Sinuses/Orbits: Negative.  Other: Mastoids are clear. Grossly normal visible  internal auditory structures. Negative visible scalp and face.  IMPRESSION: 1. Positive for several small acute infarcts in  the both cerebral hemispheres - the largest in the posterior right centrum semiovale is 10 mm. No associated hemorrhage or mass effect.  This pattern could indicate a recent embolic event in the setting of atrial fibrillation, or might reflect synchronous small vessel disease (#2).  2. Underlying advanced chronic small vessel disease, including occasional small chronic cortical infarcts and several chronic microhemorrhages in the bilateral deep gray nuclei.   Electronically Signed   By: Genevie Ann M.D.   On: 12/22/2019 22:10  MRA HEAD WITHOUT CONTRAST  TECHNIQUE: Angiographic images of the Circle of Willis were obtained using MRA technique without intravenous contrast.  COMPARISON:  MRI today reported separately.  FINDINGS: Mildly motion degraded.  Antegrade flow in the posterior circulation with dominant left vertebral artery. Patent vertebrobasilar junction. No distal vertebral or basilar stenosis. Patent SCA and PCA origins, with a fetal type left PCA origin and small right posterior communicating artery. Bilateral PCA branches are within normal limits when allowing for motion.  Antegrade flow in both ICA siphons. No siphon stenosis. Infundibulum suspected at the origin of the left ophthalmic artery (normal variant). Posterior communicating artery origins are within normal limits. Patent carotid termini.  Patent MCA M1 and ACA A1 segments, the left A1 is dominant. Anterior communicating artery and visible ACA branches are within normal limits. Both carotid bifurcations are patent. No MCA branch occlusion is identified.  IMPRESSION: 1. Negative for large vessel occlusion. 2. Some circle-of-Willis branch detail is degraded by motion, but relatively mild for age intracranial atherosclerosis is suspected. 3. Infundibulum suspected at  the left ophthalmic artery origin (normal variant).   Electronically Signed   By: Genevie Ann M.D.   On: 12/22/2019 22:14  BILATERAL CAROTID DUPLEX ULTRASOUND  TECHNIQUE: Pearline Cables scale imaging, color Doppler and duplex ultrasound were performed of bilateral carotid and vertebral arteries in the neck. Technologist describes technically difficult study secondary to altered mental status of the patient.  COMPARISON:  None.  FINDINGS: Criteria: Quantification of carotid stenosis is based on velocity parameters that correlate the residual internal carotid diameter with NASCET-based stenosis levels, using the diameter of the distal internal carotid lumen as the denominator for stenosis measurement.  The following velocity measurements were obtained:  RIGHT  ICA: 85/19 cm/sec  CCA: 38/10 cm/sec  SYSTOLIC ICA/CCA RATIO:  1.2  ECA: 109 cm/sec  LEFT  ICA: 84/22 cm/sec  CCA: 17/51 cm/sec  SYSTOLIC ICA/CCA RATIO:  1.1  ECA: 98 cm/sec  RIGHT CAROTID ARTERY: Eccentric calcified plaque in the bulb with only mild stenosis. Mild tortuosity. Normal waveforms and color Doppler signal throughout.  RIGHT VERTEBRAL ARTERY:  Normal flow direction and waveform.  LEFT CAROTID ARTERY: Minimal plaque in the bulb. No significant stenosis. Normal waveforms and color Doppler signal throughout.  LEFT VERTEBRAL ARTERY:  Normal flow direction and waveform.  IMPRESSION: 1. Bilateral carotid bifurcation plaque resulting in less than 50% diameter ICA stenosis. 2. Antegrade bilateral vertebral arterial flow.   Electronically Signed   By: Lucrezia Europe M.D.   On: 12/23/2019 10:25   Treatments: Eliquis  Discharge Exam: Blood pressure 118/63, pulse 64, temperature 97.8 F (36.6 C), temperature source Oral, resp. rate 17, height 5\' 5"  (1.651 m), weight 115.4 kg, SpO2 98 %. General appearance: alert, cooperative and Oriented to place and person. Resp: clear to  auscultation bilaterally Cardio: regular rate and rhythm, S1, S2 normal, no murmur, click, rub or gallop GI: soft, non-tender; bowel sounds normal; no masses,  no organomegaly Extremities: extremities normal, atraumatic, no cyanosis or  edema  Neuro; patient does not have any dysarthria.  No focal weakness.  Disposition: Discharge disposition: 03-Skilled Nursing Facility       Discharge Instructions    Diet - low sodium heart healthy   Complete by: As directed    Discharge wound care:   Complete by: As directed    Followed by RN in SNF   Increase activity slowly   Complete by: As directed      Allergies as of 12/27/2019      Reactions   Morphine And Related Other (See Comments), Nausea Only   Patient becomes very confused Other reaction(s): Other (See Comments), Other (See Comments) Patient becomes very confused Patient becomes very confused   Penicillins Hives, Shortness Of Breath, Swelling, Other (See Comments)   Facial swelling Has patient had a PCN reaction causing immediate rash, facial/tongue/throat swelling, SOB or lightheadedness with hypotension: Yes Has patient had a PCN reaction causing severe rash involving mucus membranes or skin necrosis: Yes Has patient had a PCN reaction that required hospitalization No Has patient had a PCN reaction occurring within the last 10 years: No If all of the above answers are "NO", then may proceed with Cephalosporin use. Other reaction(s): Other (See Comments), Other (See Comments) Facial swelling Has patient had a PCN reaction causing immediate rash, facial/tongue/throat swelling, SOB or lightheadedness with hypotension: Yes Has patient had a PCN reaction causing severe rash involving mucus membranes or skin necrosis: Yes Has patient had a PCN reaction that required hospitalization No Has patient had a PCN reaction occurring within the last 10 years: No If all of the above answers are "NO", then may proceed with Cephalosporin  use. Facial swelling Has patient had a PCN reaction causing immediate rash, facial/tongue/throat swelling, SOB or lightheadedness with hypotension: Yes Has patient had a PCN reaction causing severe rash involving mucus membranes or skin necrosis: Yes Has patient had a PCN reaction that required hospitalization No Has patient had a PCN reaction occurring within the last 10 years: No If all of the above answers are "NO", then may proceed with Cephalosporin use. Facial swelling Has patient had a PCN reaction causing immediate rash, facial/tongue/throat swelling, SOB or lightheadedness with hypotension: Yes Has patient had a PCN reaction causing severe rash involving mucus membranes or skin necrosis: Yes Has patient had a PCN reaction that required hospitalization No Has patient had a PCN reaction occurring within the last 10 years: No If all of the above answers are "NO", then may proceed with Cephalosporin use.   Ambien [zolpidem]    Hallucination    Aspirin Hives   Oxytetracycline Hives   Other reaction(s): Unknown      Medication List    STOP taking these medications   dabigatran 150 MG Caps capsule Commonly known as: Pradaxa   meloxicam 15 MG tablet Commonly known as: MOBIC     TAKE these medications   acetaminophen 325 MG tablet Commonly known as: TYLENOL Take 2 tablets (650 mg total) by mouth every 6 (six) hours as needed for mild pain or moderate pain (or Fever >/= 101).   Advair HFA 115-21 MCG/ACT inhaler Generic drug: fluticasone-salmeterol Inhale 2 puffs into the lungs daily. Rinse mouth thoroughly after use   albuterol 108 (90 Base) MCG/ACT inhaler Commonly known as: VENTOLIN HFA Inhale 2 puffs into the lungs every 6 (six) hours as needed for wheezing or shortness of breath.   albuterol (2.5 MG/3ML) 0.083% nebulizer solution Commonly known as: PROVENTIL Take 2.5 mg by nebulization every  4 (four) hours as needed for wheezing or shortness of breath.   ALPRAZolam  0.5 MG tablet Commonly known as: XANAX Take 0.5 tablets (0.25 mg total) by mouth 2 (two) times daily as needed for anxiety.   amLODipine 5 MG tablet Commonly known as: NORVASC Take 1 tablet (5 mg total) by mouth daily.   apixaban 5 MG Tabs tablet Commonly known as: ELIQUIS Take 1 tablet (5 mg total) by mouth 2 (two) times daily.   buPROPion 150 MG 24 hr tablet Commonly known as: WELLBUTRIN XL Take 1 tablet (150 mg total) by mouth daily. What changed: how much to take   clobetasol cream 0.05 % Commonly known as: TEMOVATE Apply 1 application topically 2 (two) times daily.   colchicine 0.6 MG tablet Take 0.6 mg by mouth 2 (two) times daily.   Ditropan XL 10 MG 24 hr tablet Generic drug: oxybutynin Take 10 mg by mouth at bedtime.   DULoxetine 60 MG capsule Commonly known as: CYMBALTA Take 60 mg by mouth daily.   ergocalciferol 1.25 MG (50000 UT) capsule Commonly known as: VITAMIN D2 Take 50,000 Units by mouth once a week.   estradiol 0.1 MG/GM vaginal cream Commonly known as: ESTRACE Place 1 Applicatorful vaginally every Monday, Wednesday, and Friday. Apply 0.5mg  (pea-sized amount)  just inside the vaginal introitus with a finger-tip on Monday, Wednesday and Friday nights,   HYDROcodone-acetaminophen 5-325 MG tablet Commonly known as: NORCO/VICODIN Take 1 tablet by mouth every 8 (eight) hours as needed for severe pain. Must last 30 days What changed: Another medication with the same name was removed. Continue taking this medication, and follow the directions you see here.   levocetirizine 5 MG tablet Commonly known as: XYZAL Take 0.5 tablets (2.5 mg total) by mouth every evening. What changed: how much to take   levothyroxine 175 MCG tablet Commonly known as: SYNTHROID Take 1 tablet (175 mcg total) by mouth daily before breakfast.   losartan-hydrochlorothiazide 50-12.5 MG tablet Commonly known as: HYZAAR Take 1 tablet by mouth daily.   metFORMIN 500 MG  tablet Commonly known as: GLUCOPHAGE Take 1 tablet (500 mg total) by mouth 2 (two) times daily with a meal. With food What changed:   medication strength  how much to take  when to take this   metoprolol succinate 50 MG 24 hr tablet Commonly known as: TOPROL-XL TAKE 1 TABLET BY MOUTH DAILY   montelukast 10 MG tablet Commonly known as: SINGULAIR Take 1 tablet (10 mg total) by mouth at bedtime.   ondansetron 4 MG tablet Commonly known as: Zofran Take 1 tablet (4 mg total) by mouth every 8 (eight) hours as needed for nausea or vomiting.   pantoprazole 40 MG tablet Commonly known as: PROTONIX Take 40 mg by mouth daily.   pregabalin 50 MG capsule Commonly known as: LYRICA Take 1 capsule (50 mg total) by mouth 3 (three) times daily.   sitaGLIPtin 25 MG tablet Commonly known as: Januvia Take 1 tablet (25 mg total) by mouth daily.   sucralfate 1 GM/10ML suspension Commonly known as: CARAFATE Take 1 g by mouth 3 (three) times daily.   Toviaz 8 MG Tb24 tablet Generic drug: fesoterodine TAKE ONE TABLET BY MOUTH EVERY DAY   trospium 20 MG tablet Commonly known as: SANCTURA Take 20 mg by mouth 2 (two) times daily.            Discharge Care Instructions  (From admission, onward)         Start  Ordered   12/27/19 0000  Discharge wound care:       Comments: Followed by RN in [facility]   12/27/19 1046          Contact information for follow-up providers    [name], [name], MD Follow up in 1 week(s).   Specialty: Internal Medicine Contact information: [place] [address] [phone]        Wellington Hampshire, MD .   Specialty: Cardiology Contact information: [place] [address] [phone]        [facility] Follow up in 2 week(s).   Contact information: [place] [phone]           Contact information for  after-discharge care    [facility] .   Service: Skilled Nursing Contact information: [place] [phone]                 35 minutes  Signed: Sharen Hones 12/27/2019, 10:47 AM

## 2019-12-27 NOTE — Telephone Encounter (Addendum)
[facility] called in for [facility] f/u for patient scheduled on 10-21 at 10:00

## 2019-12-27 NOTE — Telephone Encounter (Signed)
Noted. Will follow as appropriate.  

## 2019-12-27 NOTE — Telephone Encounter (Signed)
Call husband [name] to discuss hosp. With stroke+ on MRI,  CT negative  She will go to rehab for PT/OT and will need f/u with Neurology, and PCP in the future

## 2019-12-28 ENCOUNTER — Other Ambulatory Visit: Payer: PPO

## 2019-12-28 ENCOUNTER — Ambulatory Visit: Payer: PPO | Admitting: Internal Medicine

## 2019-12-28 DIAGNOSIS — G8194 Hemiplegia, unspecified affecting left nondominant side: Secondary | ICD-10-CM | POA: Insufficient documentation

## 2019-12-28 NOTE — Telephone Encounter (Signed)
Patient's husband spoke with Dr Olivia Mackie [name] yesterday about this matter. States issue has been resolved.

## 2019-12-28 NOTE — Telephone Encounter (Signed)
Left message to return call. No answer, no voicemail on mobile number

## 2019-12-28 NOTE — Telephone Encounter (Signed)
Called husband 12/28/19 pt at [facility] for stroke

## 2019-12-28 NOTE — Telephone Encounter (Signed)
Spoke with husband [name]. He understands all per discussion with Dr. Olivia Mackie on yesterday. HFU appointment cancelled. Husband to call back and reschedule at a later date.

## 2019-12-29 NOTE — Telephone Encounter (Signed)
Patient's husband informed and verbalized understanding.  Patient is currently in a rehab facility. States he will be going over there today to see his wife. States he will maker sure they are aware of this change.

## 2020-01-03 DIAGNOSIS — G8194 Hemiplegia, unspecified affecting left nondominant side: Secondary | ICD-10-CM | POA: Diagnosis not present

## 2020-01-03 DIAGNOSIS — M79602 Pain in left arm: Secondary | ICD-10-CM | POA: Diagnosis not present

## 2020-01-03 DIAGNOSIS — G8929 Other chronic pain: Secondary | ICD-10-CM | POA: Insufficient documentation

## 2020-01-08 ENCOUNTER — Telehealth: Payer: Self-pay | Admitting: Internal Medicine

## 2020-01-08 NOTE — Telephone Encounter (Signed)
Patient DPR husband aware.

## 2020-01-08 NOTE — Telephone Encounter (Signed)
Patient husband called says patient is having some itching and facility will not run UA due to patient has no symptoms of UTIsuch as fever or delirium. Patient husband ask if PCP could send order for Ua , advised husband DPR that I would send message to PCP, but during admission to [facility] patients are under the care of facility providers and their Protocols. I advised him to also  let facility know she is itching she may need to be checked for yeast or just have vaginal dryness. DPR  Voiced understanding and stated he would discuss on on coming nurse today.

## 2020-01-08 NOTE — Telephone Encounter (Signed)
Agree yes facility MD must evaluate pt while there and he needs to advise the nurse  UA not best to check for yeast and they may need to examine her the doctor rounding  Advise husband facility doctor needs to see pt for this and mention to nurse as dicussed at [facility]

## 2020-01-09 ENCOUNTER — Other Ambulatory Visit: Payer: Self-pay | Admitting: Internal Medicine

## 2020-01-09 DIAGNOSIS — B373 Candidiasis of vulva and vagina: Secondary | ICD-10-CM

## 2020-01-09 DIAGNOSIS — B3731 Acute candidiasis of vulva and vagina: Secondary | ICD-10-CM

## 2020-01-09 MED ORDER — FLUCONAZOLE 150 MG PO TABS: 150.0000 mg | ORAL_TABLET | Freq: Once | ORAL | 0 refills | Status: AC

## 2020-01-09 NOTE — Telephone Encounter (Signed)
Spoke with patient and her husband. He will go and pick the medication up and bring it back to her.

## 2020-01-09 NOTE — Telephone Encounter (Signed)
Call husband [facility] he can go pick up and discussed with pts nurse and the director  Someone should see her in the facility for her complaints but appears has not since last week [employer]

## 2020-01-09 NOTE — Telephone Encounter (Signed)
Patient calling back in. States that she is having a lot of itching and discomfort. Patient states that for a while she was having UTI symptoms an then received a lot of antibiotics when she was in the hospital.   She states that every time she takes antibiotics she gets a yeast infection. Patient is also incontinent so they keep a diaper on her as well at the facility.   Patient has asked the onsite doctor and her urologist for antifungal medication. They have all said no. She states the facility provider states she is not having enough symptoms. Patient states she was recently allowed to clean herself and she states she has "yeast on her bottom."   Patient is wondering what she can do?  Please advise

## 2020-01-10 ENCOUNTER — Ambulatory Visit (INDEPENDENT_AMBULATORY_CARE_PROVIDER_SITE_OTHER): Payer: PPO | Admitting: Family

## 2020-01-10 DIAGNOSIS — I480 Paroxysmal atrial fibrillation: Secondary | ICD-10-CM

## 2020-01-10 NOTE — Progress Notes (Signed)
Error. No show 01/10/20.   Loel Dubonnet, NP

## 2020-01-11 ENCOUNTER — Encounter: Payer: Self-pay | Admitting: Family

## 2020-01-11 ENCOUNTER — Ambulatory Visit: Payer: PPO | Admitting: Internal Medicine

## 2020-01-12 ENCOUNTER — Telehealth: Payer: Self-pay | Admitting: Internal Medicine

## 2020-01-12 NOTE — Telephone Encounter (Signed)
DME for toilet seat elevator is ok if this is what is needed confirm first    Thanks [place]

## 2020-01-12 NOTE — Telephone Encounter (Signed)
Please advise , would you like to place a DME order or a letter?

## 2020-01-12 NOTE — Telephone Encounter (Signed)
Patient's husband called in stated that [employer] [employer]  wanted them to pay for the elevated seat and patient wants Dr.McLean to write a note so the insurance would pay for the seat.

## 2020-01-16 NOTE — Telephone Encounter (Signed)
Spoke with patient's husband states that a nurse from the rehab center states that they may be able to get the Patient everything she needs upon dismissal.   Husband will look in to this and then give [place] a call back if she still needs DME supplies.

## 2020-01-16 NOTE — Telephone Encounter (Signed)
Left message to return call on mobile No answer, no voicemail on home phone

## 2020-01-25 DIAGNOSIS — M7582 Other shoulder lesions, left shoulder: Secondary | ICD-10-CM | POA: Diagnosis not present

## 2020-01-25 DIAGNOSIS — E1142 Type 2 diabetes mellitus with diabetic polyneuropathy: Secondary | ICD-10-CM | POA: Diagnosis not present

## 2020-01-25 DIAGNOSIS — M19012 Primary osteoarthritis, left shoulder: Secondary | ICD-10-CM | POA: Diagnosis not present

## 2020-01-25 DIAGNOSIS — Z6841 Body Mass Index (BMI) 40.0 and over, adult: Secondary | ICD-10-CM | POA: Diagnosis not present

## 2020-01-25 DIAGNOSIS — M542 Cervicalgia: Secondary | ICD-10-CM | POA: Diagnosis not present

## 2020-01-29 ENCOUNTER — Telehealth: Payer: Self-pay

## 2020-01-29 NOTE — Telephone Encounter (Addendum)
Transition Care Management Unsuccessful Follow-up Telephone Call  Date of discharge and from where:  01/26/20 from [facility]  Attempts:  1st Attempt  Reason for unsuccessful TCM follow-up call:  Left voice message. Will follow as appropriate.   Patient scheduled to follow up CVD- Laurann Montana, NP on 01/30/20.  Patient scheduled to follow up with Neurology Manuella Ghazi, Pershing Proud, MD) on 02/19/20.

## 2020-01-30 ENCOUNTER — Encounter: Payer: Self-pay | Admitting: Family

## 2020-01-30 ENCOUNTER — Ambulatory Visit: Payer: PPO | Admitting: Family

## 2020-01-30 ENCOUNTER — Other Ambulatory Visit: Payer: Self-pay

## 2020-01-30 VITALS — BP 122/72 | HR 55 | Ht 65.0 in | Wt 241.0 lb

## 2020-01-30 DIAGNOSIS — E785 Hyperlipidemia, unspecified: Secondary | ICD-10-CM | POA: Diagnosis not present

## 2020-01-30 DIAGNOSIS — M109 Gout, unspecified: Secondary | ICD-10-CM | POA: Diagnosis not present

## 2020-01-30 DIAGNOSIS — I480 Paroxysmal atrial fibrillation: Secondary | ICD-10-CM

## 2020-01-30 DIAGNOSIS — G4733 Obstructive sleep apnea (adult) (pediatric): Secondary | ICD-10-CM

## 2020-01-30 DIAGNOSIS — Z7984 Long term (current) use of oral hypoglycemic drugs: Secondary | ICD-10-CM | POA: Diagnosis not present

## 2020-01-30 DIAGNOSIS — I10 Essential (primary) hypertension: Secondary | ICD-10-CM

## 2020-01-30 DIAGNOSIS — R32 Unspecified urinary incontinence: Secondary | ICD-10-CM | POA: Diagnosis not present

## 2020-01-30 DIAGNOSIS — E119 Type 2 diabetes mellitus without complications: Secondary | ICD-10-CM | POA: Diagnosis not present

## 2020-01-30 DIAGNOSIS — N390 Urinary tract infection, site not specified: Secondary | ICD-10-CM | POA: Diagnosis not present

## 2020-01-30 DIAGNOSIS — Z79899 Other long term (current) drug therapy: Secondary | ICD-10-CM | POA: Diagnosis not present

## 2020-01-30 DIAGNOSIS — R69 Illness, unspecified: Secondary | ICD-10-CM | POA: Diagnosis not present

## 2020-01-30 DIAGNOSIS — F329 Major depressive disorder, single episode, unspecified: Secondary | ICD-10-CM | POA: Diagnosis not present

## 2020-01-30 DIAGNOSIS — Z7901 Long term (current) use of anticoagulants: Secondary | ICD-10-CM | POA: Diagnosis not present

## 2020-01-30 DIAGNOSIS — F419 Anxiety disorder, unspecified: Secondary | ICD-10-CM | POA: Diagnosis not present

## 2020-01-30 DIAGNOSIS — Z8673 Personal history of transient ischemic attack (TIA), and cerebral infarction without residual deficits: Secondary | ICD-10-CM | POA: Diagnosis not present

## 2020-01-30 DIAGNOSIS — E1151 Type 2 diabetes mellitus with diabetic peripheral angiopathy without gangrene: Secondary | ICD-10-CM | POA: Diagnosis not present

## 2020-01-30 DIAGNOSIS — I69320 Aphasia following cerebral infarction: Secondary | ICD-10-CM | POA: Diagnosis not present

## 2020-01-30 DIAGNOSIS — G894 Chronic pain syndrome: Secondary | ICD-10-CM | POA: Diagnosis not present

## 2020-01-30 DIAGNOSIS — I69354 Hemiplegia and hemiparesis following cerebral infarction affecting left non-dominant side: Secondary | ICD-10-CM | POA: Diagnosis not present

## 2020-01-30 DIAGNOSIS — K219 Gastro-esophageal reflux disease without esophagitis: Secondary | ICD-10-CM | POA: Diagnosis not present

## 2020-01-30 DIAGNOSIS — G4700 Insomnia, unspecified: Secondary | ICD-10-CM | POA: Diagnosis not present

## 2020-01-30 DIAGNOSIS — E1140 Type 2 diabetes mellitus with diabetic neuropathy, unspecified: Secondary | ICD-10-CM | POA: Diagnosis not present

## 2020-01-30 DIAGNOSIS — N3281 Overactive bladder: Secondary | ICD-10-CM | POA: Diagnosis not present

## 2020-01-30 DIAGNOSIS — J309 Allergic rhinitis, unspecified: Secondary | ICD-10-CM | POA: Diagnosis not present

## 2020-01-30 DIAGNOSIS — E039 Hypothyroidism, unspecified: Secondary | ICD-10-CM | POA: Diagnosis not present

## 2020-01-30 DIAGNOSIS — J45909 Unspecified asthma, uncomplicated: Secondary | ICD-10-CM | POA: Diagnosis not present

## 2020-01-30 DIAGNOSIS — E559 Vitamin D deficiency, unspecified: Secondary | ICD-10-CM | POA: Diagnosis not present

## 2020-01-30 MED ORDER — APIXABAN 5 MG PO TABS: 5.0000 mg | ORAL_TABLET | Freq: Two times a day (BID) | ORAL | 2 refills | Status: DC

## 2020-01-30 MED ORDER — ROSUVASTATIN CALCIUM 20 MG PO TABS: 20.0000 mg | ORAL_TABLET | Freq: Every day | ORAL | 5 refills | Status: DC

## 2020-01-30 NOTE — Telephone Encounter (Signed)
Patient returned office phone call for a hospital follow up. Patient wanted to come on 02/01/20, appointment scheduled.

## 2020-01-30 NOTE — Progress Notes (Signed)
Office Visit    Patient Name: [name] Date of Encounter: 01/30/2020  Primary Care Provider:  [name], [name], MD Primary Cardiologist:  Kathlyn Sacramento, MD Electrophysiologist:  None   Chief Complaint    [name] is a 69 y.o. adult with a hx of CVA, HTN, DM2, morbid obesity, chronic pain, PAF, previous DVT, OSA on CPAP, obesity presents today for follow-up after hospitalization with stroke.  Past Medical History    Past Medical History:  Diagnosis Date  . Abnormal antibody titer   . Anxiety and depression   . Arthritis   . Asthma   . Cystocele   . Depression   . Diabetes ([place])   . DVT (deep venous thrombosis) (HCC)    Righ calf  . Dyspnea    11/08/2018 - "more now due to lack of exercise"  . Dysrhythmia    afib  . Edema   . GERD (gastroesophageal reflux disease)   . Heart murmur    Echocardiogram June 2015: Mild MR (possible vegetation seen on TTE, not seen on TEE), normal LV size with moderate concentric LVH. Normal function EF 60-65%. Normal diastolic function. Mild LA dilation.  Marland Kitchen History of IBS   . History of kidney stones   . History of methicillin resistant staphylococcus aureus (MRSA) 2008  . Hyperlipidemia   . Hypertension   . Hypothyroidism   . Insomnia   . Kidney stone    kidney stones with lithotripsy .  [facility]- "adrenal glands"  . Neuropathy involving both lower extremities   . Obesity, Class II, BMI 35-39.9, with comorbidity   . Paroxysmal atrial fibrillation ([place]) 2007   In June 2015, Cardiac Event Monitor: Mostly SR/sinus arrhythmia with PVCs that are frequent. Short bursts of A. fib lasting several minutes;; CHA2DS2-VASc Score = 5 (age, Female, PVD, DM, HTN)  . Peripheral vascular disease ([place])   . Sleep apnea    use C-PAP  . Urinary incontinence   . Venous stasis dermatitis of both lower extremities    Past Surgical History:  Procedure Laterality Date  . ANKLE SURGERY Right   . APPLICATION OF WOUND VAC Left  06/27/2015   Procedure: APPLICATION OF WOUND VAC ( POSSIBLE ) ;  Surgeon: [name], MD;  Location: [facility] ORS;  Service: Vascular;  Laterality: Left;  . APPLICATION OF WOUND VAC Left 09/25/2016   Procedure: APPLICATION OF WOUND VAC;  Surgeon: [name], DPM;  Location: [facility] ORS;  Service: Podiatry;  Laterality: Left;  . arm surgery     right    . CHOLECYSTECTOMY    . COLONOSCOPY    . COLONOSCOPY WITH PROPOFOL N/A 01/11/2017   Procedure: COLONOSCOPY WITH PROPOFOL;  Surgeon: [name], MD;  Location: [facility] ENDOSCOPY;  Service: Endoscopy;  Laterality: N/A;  . COLONOSCOPY WITH PROPOFOL N/A 02/22/2017   Procedure: COLONOSCOPY WITH PROPOFOL;  Surgeon: [name], MD;  Location: [facility] ENDOSCOPY;  Service: Endoscopy;  Laterality: N/A;  . COLONOSCOPY WITH PROPOFOL N/A 05/31/2017   Procedure: COLONOSCOPY WITH PROPOFOL;  Surgeon: [name], MD;  Location: [facility] ENDOSCOPY;  Service: Endoscopy;  Laterality: N/A;  . ESOPHAGOGASTRODUODENOSCOPY (EGD) WITH PROPOFOL N/A 01/11/2017   Procedure: ESOPHAGOGASTRODUODENOSCOPY (EGD) WITH PROPOFOL;  Surgeon: [name], MD;  Location: [facility] ENDOSCOPY;  Service: Endoscopy;  Laterality: N/A;  . ESOPHAGOGASTRODUODENOSCOPY (EGD) WITH PROPOFOL N/A 10/25/2018   Procedure: ESOPHAGOGASTRODUODENOSCOPY (EGD) WITH PROPOFOL;  Surgeon: [name], MD;  Location: [facility] ENDOSCOPY;  Service: Endoscopy;  Laterality: N/A;  .  ESOPHAGOGASTRODUODENOSCOPY (EGD) WITH PROPOFOL N/A 11/29/2019   Procedure: ESOPHAGOGASTRODUODENOSCOPY (EGD) WITH PROPOFOL;  Surgeon: Toledo, Benay Pike, MD;  Location: [facility] ENDOSCOPY;  Service: Gastroenterology;  Laterality: N/A;  . HERNIA REPAIR     umbilical  . HIATAL HERNIA REPAIR    . I & D EXTREMITY Left 06/27/2015   Procedure: IRRIGATION AND DEBRIDEMENT EXTREMITY            ( CALF HEMATOMA ) POSSIBLE WOUND VAC;  Surgeon: [name], MD;  Location: [facility] ORS;  Service: Vascular;  Laterality: Left;  . INCISION AND DRAINAGE OF  WOUND Left 09/25/2016   Procedure: IRRIGATION AND DEBRIDEMENT - PARTIAL RESECTION OF ACHILLES TENDON WITH WOUND VAC APPLICATION;  Surgeon: [name], DPM;  Location: [facility] ORS;  Service: Podiatry;  Laterality: Left;  . INCISION AND DRAINAGE OF WOUND Left 11/09/2018   Procedure: Excision of left foot wound with ACell placement;  Surgeon: Wallace Going, DO;  Location: [place];  Service: Plastics;  Laterality: Left;  . IRRIGATION AND DEBRIDEMENT ABSCESS Left 09/07/2016   Procedure: IRRIGATION AND DEBRIDEMENT ABSCESS LEFT HEEL;  Surgeon: [name], DPM;  Location: [facility];  Service: Podiatry;  Laterality: Left;  . JOINT REPLACEMENT  2013   left knee replacement  . KIDNEY SURGERY Right    kidney stones  . LITHOTRIPSY    . LOWER EXTREMITY ANGIOGRAPHY Left 04/04/2018   Procedure: LOWER EXTREMITY ANGIOGRAPHY;  Surgeon: [name], MD;  Location: [place] [facility];  Service: Cardiovascular;  Laterality: Left;  . NM GATED MYOVIEW ([place] [facility])  February 2017   Likely breast attenuation. LOW RISK study. Normal EF 55-60%.  . OTHER SURGICAL HISTORY     08/2016 or 09/2016 surgery on achilles tenso h/o staph infection heal. Dr. [name]  . removal of left hematoma Left    leg  . REVERSE SHOULDER ARTHROPLASTY Right 10/08/2015   Procedure: REVERSE SHOULDER ARTHROPLASTY;  Surgeon: [name], MD;  Location: [facility] ORS;  Service: Orthopedics;  Laterality: Right;  . SLEEVE GASTROPLASTY    . TONSILLECTOMY    . TOTAL KNEE ARTHROPLASTY Left   . TOTAL SHOULDER REPLACEMENT Right   . TRANSESOPHAGEAL ECHOCARDIOGRAM  08/08/2013   Mild LVH, EF 60-65%. Moderate LA dilation and mild RA dilation. Mild MR with no evidence of stenosis and no evidence of endocarditis. A false chordae is noted.  . TRANSTHORACIC ECHOCARDIOGRAM  08/03/2013   Mild-Moderate concentric LVH, EF 60-65%. Normal diastolic function. Mild LA dilation. Mild MR with possible vegetation  - not confirmed on TEE   . ULNAR NERVE TRANSPOSITION  Right 08/06/2016   Procedure: ULNAR NERVE DECOMPRESSION/TRANSPOSITION;  Surgeon: [name], MD;  Location: [facility] ORS;  Service: Orthopedics;  Laterality: Right;  . UPPER GI ENDOSCOPY    . VAGINAL HYSTERECTOMY      Allergies  Allergies  Allergen Reactions  . Morphine And Related Other (See Comments) and Nausea Only    Patient becomes very confused Other reaction(s): Other (See Comments), Other (See Comments) Patient becomes very confused Patient becomes very confused   . Penicillins Hives, Shortness Of Breath, Swelling and Other (See Comments)    Facial swelling Has patient had a PCN reaction causing immediate rash, facial/tongue/throat swelling, SOB or lightheadedness with hypotension: Yes Has patient had a PCN reaction causing severe rash involving mucus membranes or skin necrosis: Yes Has patient had a PCN reaction that required hospitalization No Has patient had a PCN reaction occurring within the last 10 years: No If all of the above  answers are "NO", then may proceed with Cephalosporin use.  Other reaction(s): Other (See Comments), Other (See Comments) Facial swelling Has patient had a PCN reaction causing immediate rash, facial/tongue/throat swelling, SOB or lightheadedness with hypotension: Yes Has patient had a PCN reaction causing severe rash involving mucus membranes or skin necrosis: Yes Has patient had a PCN reaction that required hospitalization No Has patient had a PCN reaction occurring within the last 10 years: No If all of the above answers are "NO", then may proceed with Cephalosporin use. Facial swelling Has patient had a PCN reaction causing immediate rash, facial/tongue/throat swelling, SOB or lightheadedness with hypotension: Yes Has patient had a PCN reaction causing severe rash involving mucus membranes or skin necrosis: Yes Has patient had a PCN reaction that required hospitalization No Has patient had a PCN reaction occurring within the last 10 years:  No If all of the above answers are "NO", then may proceed with Cephalosporin use. Facial swelling Has patient had a PCN reaction causing immediate rash, facial/tongue/throat swelling, SOB or lightheadedness with hypotension: Yes Has patient had a PCN reaction causing severe rash involving mucus membranes or skin necrosis: Yes Has patient had a PCN reaction that required hospitalization No Has patient had a PCN reaction occurring within the last 10 years: No If all of the above answers are "NO", then may proceed with Cephalosporin use.   . Ambien [Zolpidem]     Hallucination   . Aspirin Hives  . Oxytetracycline Hives    Other reaction(s): Unknown     History of Present Illness    DODY SMARTT is a 69 y.o. adult with a hx of CVA, HTN, DM2, morbid obesity, chronic pain, PAF, previous DVT, OSA on CPAP, obesity last seen 11/28/2019.  She follows with Dr. Lucky Cowboy for chronic left foot ulceration.  There was no evidence of PAD on noninvasive Doppler as well as angiogram done in 2020.  She had debridement with subsequent improvement.   She had atypical chest pain in the past and had Lexiscan Myoview 03/2018 was low risk with no evidence of ischemia.  Her PAF has been well controlled on Toprol 50 daily, she had bradycardia on higher doses.  She is anticoagulated with Pradaxa.   She was admitted June 2021 with pneumonia and sepsis.  She had minimally elevated troponins of 120, 170, 143 which were asymptomatic and was presumed to be due to demand ischemia. Echocardiogram while admitted with EF 60 to 65%, no RWMA, RV normal size and function, no significant valvular abnormalities.  Called the office 10/30/2019 noting similar symptoms to when she was understandably concerned.  Subsequent urine culture with PCP with showed Klebsiella resistant to many antibiotics.  Started on Levaquin which is the only oral option left.  Admitted 12/21/2019-12/27/2019 after presenting with AMS and generalized weakness with  particular weakness of LUE.  MRI of brain with bilateral small multiple acute strokes suggestive of embolic etiology.  Her Pradaxa was transitioned to Eliquis.  LDL 44 and A1c 6.9 while admitted.  She was discharged to SNF.   Presents today for follow up with her husband. She had been discharged from SNF and is working with home health. Reports her left side continues to gain strength but she has had difficulty adjusting to the height of her bed at home.  No chest pain, pressure, tightness. No shortness of breath at rest. Stable dyspnea on exertion. Reports no lower extremity edema, orthopnea, PND, palpitations. We discussed why her Pradaxa was transitioned to Eliquis and  she was appreciative of the explanation.   EKGs/Labs/Other Studies Reviewed:   The following studies were reviewed today: Echo 08/2019 1. Left ventricular ejection fraction, by estimation, is 60 to 65%. The  left ventricle has normal function. The left ventricle has no regional  wall motion abnormalities. Left ventricular diastolic parameters were  normal.   2. Right ventricular systolic function is normal. The right ventricular  size is normal.   3. The mitral valve is normal in structure. No evidence of mitral valve  regurgitation. No evidence of mitral stenosis.   4. The aortic valve is normal in structure. Aortic valve regurgitation is  not visualized. No aortic stenosis is present.   5. The inferior vena cava is normal in size with <50% respiratory  variability, suggesting right atrial pressure of 8 mmHg.   EKG:  EKG is ordered today.  The ekg ordered today demonstrates junctional rhythm 55bpm with no acute ST/T wave ranges.   Recent Labs: 09/02/2019: B Natriuretic Peptide 384.0 12/19/2019: ALT 10 12/25/2019: BUN 25; Creatinine, Ser 1.12; Hemoglobin 9.5; Magnesium 2.2; Platelets 338; Potassium 4.6; Sodium 138  Recent Lipid Panel    Component Value Date/Time   CHOL 122 12/23/2019 0644   CHOL 185 10/10/2014 0822    TRIG 77 12/23/2019 0644   HDL 63 12/23/2019 0644   HDL 94 10/10/2014 0822   CHOLHDL 1.9 12/23/2019 0644   VLDL 15 12/23/2019 0644   LDLCALC 44 12/23/2019 0644   LDLCALC 73 10/10/2014 0822    Risk Assessment/Calculations:   CHA2DS2-VASc Score = 6  This indicates a 9.7% annual risk of stroke. The patient's score is based upon: CHF History: 0 HTN History: 1 Diabetes History: 1 Stroke History: 2 Vascular Disease History: 0 Age Score: 1 Gender Score: 1  Home Medications   Current Meds  Medication Sig  . acetaminophen (TYLENOL) 325 MG tablet Take 2 tablets (650 mg total) by mouth every 6 (six) hours as needed for mild pain or moderate pain (or Fever >/= 101).  Marland Kitchen albuterol (PROVENTIL) (2.5 MG/3ML) 0.083% nebulizer solution Take 2.5 mg by nebulization every 4 (four) hours as needed for wheezing or shortness of breath.  Marland Kitchen albuterol (VENTOLIN HFA) 108 (90 Base) MCG/ACT inhaler Inhale 2 puffs into the lungs every 6 (six) hours as needed for wheezing or shortness of breath.  . ALPRAZolam (XANAX) 0.5 MG tablet Take 0.5 tablets (0.25 mg total) by mouth 2 (two) times daily as needed for anxiety.  Marland Kitchen amLODipine (NORVASC) 5 MG tablet Take 1 tablet (5 mg total) by mouth daily.  Marland Kitchen apixaban (ELIQUIS) 5 MG TABS tablet Take 1 tablet (5 mg total) by mouth 2 (two) times daily.  Marland Kitchen buPROPion (WELLBUTRIN XL) 150 MG 24 hr tablet Take 1 tablet (150 mg total) by mouth daily. (Patient taking differently: Take 300 mg by mouth daily. )  . clobetasol cream (TEMOVATE) 9.79 % Apply 1 application topically 2 (two) times daily.  . colchicine 0.6 MG tablet Take 0.6 mg by mouth 2 (two) times daily.  . DULoxetine (CYMBALTA) 60 MG capsule Take 60 mg by mouth daily.  . ergocalciferol (VITAMIN D2) 1.25 MG (50000 UT) capsule Take 50,000 Units by mouth once a week.  . estradiol (ESTRACE) 0.1 MG/GM vaginal cream Place 1 Applicatorful vaginally every Monday, Wednesday, and Friday. Apply 0.5mg  (pea-sized amount)  just inside the  vaginal introitus with a finger-tip on Monday, Wednesday and Friday nights,  . fluticasone-salmeterol (ADVAIR HFA) 115-21 MCG/ACT inhaler Inhale 2 puffs into the lungs daily. Rinse  mouth thoroughly after use  . levocetirizine (XYZAL) 5 MG tablet Take 0.5 tablets (2.5 mg total) by mouth every evening. (Patient taking differently: Take 5 mg by mouth every evening. )  . levothyroxine (SYNTHROID) 175 MCG tablet Take 1 tablet (175 mcg total) by mouth daily before breakfast.  . losartan-hydrochlorothiazide (HYZAAR) 50-12.5 MG tablet Take 1 tablet by mouth daily.  . metFORMIN (GLUCOPHAGE) 500 MG tablet Take 1 tablet (500 mg total) by mouth 2 (two) times daily with a meal. With food  . metoprolol succinate (TOPROL-XL) 50 MG 24 hr tablet TAKE 1 TABLET BY MOUTH DAILY  . montelukast (SINGULAIR) 10 MG tablet Take 1 tablet (10 mg total) by mouth at bedtime.  . ondansetron (ZOFRAN) 4 MG tablet Take 1 tablet (4 mg total) by mouth every 8 (eight) hours as needed for nausea or vomiting.  Marland Kitchen oxybutynin (DITROPAN XL) 10 MG 24 hr tablet Take 10 mg by mouth at bedtime.  . pantoprazole (PROTONIX) 40 MG tablet Take 40 mg by mouth daily.   . pregabalin (LYRICA) 50 MG capsule Take 1 capsule (50 mg total) by mouth 3 (three) times daily.  . sitaGLIPtin (JANUVIA) 25 MG tablet Take 1 tablet (25 mg total) by mouth daily.  . sucralfate (CARAFATE) 1 GM/10ML suspension Take 1 g by mouth 3 (three) times daily.   . TOVIAZ 8 MG TB24 tablet TAKE ONE TABLET BY MOUTH EVERY DAY  . trospium (SANCTURA) 20 MG tablet Take 20 mg by mouth 2 (two) times daily.  . [DISCONTINUED] apixaban (ELIQUIS) 5 MG TABS tablet Take 1 tablet (5 mg total) by mouth 2 (two) times daily.   Current Facility-Administered Medications for the 01/30/20 encounter (Office Visit) with Loel Dubonnet, NP  Medication  . ipratropium-albuterol (DUONEB) 0.5-2.5 (3) MG/3ML nebulizer solution 3 mL     Review of Systems  All other systems reviewed and are otherwise  negative except as noted above.  Physical Exam    VS:  BP 122/72   Pulse (!) 55   Ht 5\' 5"  (1.651 m)   Wt 241 lb (109.3 kg)   BMI 40.10 kg/m  , BMI Body mass index is 40.1 kg/m.  Wt Readings from Last 3 Encounters:  01/30/20 241 lb (109.3 kg)  12/20/19 254 lb 6.6 oz (115.4 kg)  12/19/19 254 lb 6.6 oz (115.4 kg)    GEN: Well nourished, well developed, in no acute distress. HEENT: normal. Neck: Supple, no JVD, carotid bruits, or masses. Cardiac: bradycardic, RRR, no murmurs, rubs, or gallops. No clubbing, cyanosis, edema.  Radials/DP/PT 2+ and equal bilaterally.  Respiratory:  Respirations regular and unlabored, clear to auscultation bilaterally. GI: Soft, nontender, nondistended. MS: No deformity or atrophy. Skin: Warm and dry, no rash. Neuro:  Strength and sensation are intact. LUE 4/5 strength. RUE 5/5.  Psych: Normal affect.  Assessment & Plan    1. PAF/chronic anticoagulation- Junctional rhythm 55bpm by EKG today. Denies palpitations. She is bradycardic today but asymptomatic with no lightheadedness, dizziness, near syncope. We will continue Toprol 50mg  daily. CHA2DS2-VASc Score = 6 [CHF History: 0, HTN History: 1, Diabetes History: 1, Stroke History: 2, Vascular Disease History: 0, Age Score: 1, Gender Score: 1].  Therefore, the patient's annual risk of stroke is 9.7 %. She is anticoagulated with Eliquis 5mg  BID due to Pradaxa failure and reports no bleeding complications. Samples provided today. We also provided her with patient assistance paperwork to complete if needed.  2. HTN- BP well controlled. Continue current antihypertensive regimen.   3.  CVA- Admission 12/2019 with CVA while on Pradaxa, this was transitioned to Eliquis as above. She has discharged from SNF and working with home health at home. Strength not yet back to her baseline. She has upcoming follow up with neurology. No aspirin secondary to chronic anticoagulation. She is not on statin, for continued risk  reduction will start Crestor 20mg  daily.   4. DM2 - Continue to follow with PCP. Most recent A1c 6.9%. Start Crestor 20mg  daily to meet with guidelines. She has not taken cholesterol medication in the past.   5. OSA- CPAP compliance encouraged.  Disposition: Follow up in 3 month(s) with Dr. Fletcher Anon or APP   Signed, Loel Dubonnet, NP 01/30/2020, 7:28 PM [place]

## 2020-01-30 NOTE — Telephone Encounter (Signed)
Dr. Kelly Services noted okay for patient to schedule hfu in 4-6 weeks since already following up with 2 specialty clinics. If patient wants to keep scheduled appointment 02/01/20, will continue to follow for TCM.

## 2020-01-30 NOTE — Telephone Encounter (Signed)
Transition Care Management Unsuccessful Follow-up Telephone Call  Date of discharge and from where:  01/30/20 from [facility]  Attempts:  2nd Attempt  Reason for unsuccessful TCM follow-up call:  Unable to reach patient. Follow.

## 2020-01-30 NOTE — Patient Instructions (Addendum)
Medication Instructions:  Your physician has recommended you make the following change in your medication:  1- START Crestor 20 mg by mouth once a day.  *If you need a refill on your cardiac medications before your next appointment, please call your pharmacy*  Follow-Up: At [facility], you and your health needs are our priority.  As part of our continuing mission to provide you with exceptional heart care, we have created designated Provider Care Teams.  These Care Teams include your primary Cardiologist (physician) and Advanced Practice Providers (APPs -  Physician Assistants and Nurse Practitioners) who all work together to provide you with the care you need, when you need it.  We recommend signing up for the patient portal called "MyChart".  Sign up information is provided on this After Visit Summary.  MyChart is used to connect with patients for Virtual Visits (Telemedicine).  Patients are able to view lab/test results, encounter notes, upcoming appointments, etc.  Non-urgent messages can be sent to your provider as well.   To learn more about what you can do with MyChart, go to [email or web address].    Your next appointment:   3 month(s)  The format for your next appointment:   In Person  Provider:   You may see Kathlyn Sacramento, MD or one of the following Advanced Practice Providers on your designated Care Team:    Murray Hodgkins, NP  Christell Faith, PA-C  Marrianne Mood, PA-C  Cadence Kathlen Mody, [place]   Medication Samples have been provided to the patient.  Drug name: Eliquis       Strength: 5 mg        Qty: 2 boxes  LOT: [id number]  Exp.Date: 12/2021

## 2020-01-30 NOTE — Telephone Encounter (Signed)
Lm on vm for patient to call office about her 02/01/20 appointment.

## 2020-01-31 NOTE — Telephone Encounter (Signed)
Patient cancelled upcoming hfu appointment scheduled for 02/01/20. Keep follow up appointment scheduled 02/28/20. No further follow up at this time.

## 2020-02-01 ENCOUNTER — Ambulatory Visit: Payer: PPO | Admitting: Internal Medicine

## 2020-02-02 ENCOUNTER — Telehealth: Payer: Self-pay | Admitting: Internal Medicine

## 2020-02-02 NOTE — Telephone Encounter (Signed)
Pt husband called and they wanted an order placed to [facility] for Custodial care To help with bathing and other daily things

## 2020-02-02 NOTE — Telephone Encounter (Signed)
Called and spoke with the Patient's husband and informed the Patient will need a video visit or a in person for this referral. Informed him that a phone cal will not count per insurance purposes.   He states he is going to see if his grand-daughter will help them and then will call back to schedule either video or in person.   Okay to uses hospital follow up and same day slots for Patient to be seen sooner.

## 2020-02-04 ENCOUNTER — Other Ambulatory Visit: Payer: Self-pay

## 2020-02-04 ENCOUNTER — Encounter: Payer: Self-pay | Admitting: Emergency Medicine

## 2020-02-04 ENCOUNTER — Inpatient Hospital Stay
Admission: EM | Admit: 2020-02-04 | Discharge: 2020-02-10 | DRG: 065 | Disposition: A | Discharge status: Home / Self Care | Payer: PPO | Attending: Internal Medicine | Admitting: Internal Medicine

## 2020-02-04 ENCOUNTER — Emergency Department: Payer: PPO

## 2020-02-04 DIAGNOSIS — M79673 Pain in unspecified foot: Secondary | ICD-10-CM | POA: Diagnosis not present

## 2020-02-04 DIAGNOSIS — Z7901 Long term (current) use of anticoagulants: Secondary | ICD-10-CM

## 2020-02-04 DIAGNOSIS — G4733 Obstructive sleep apnea (adult) (pediatric): Secondary | ICD-10-CM | POA: Diagnosis not present

## 2020-02-04 DIAGNOSIS — L89620 Pressure ulcer of left heel, unstageable: Secondary | ICD-10-CM | POA: Diagnosis not present

## 2020-02-04 DIAGNOSIS — Z6841 Body Mass Index (BMI) 40.0 and over, adult: Secondary | ICD-10-CM | POA: Diagnosis not present

## 2020-02-04 DIAGNOSIS — J9811 Atelectasis: Secondary | ICD-10-CM | POA: Diagnosis not present

## 2020-02-04 DIAGNOSIS — Z9884 Bariatric surgery status: Secondary | ICD-10-CM

## 2020-02-04 DIAGNOSIS — L97429 Non-pressure chronic ulcer of left heel and midfoot with unspecified severity: Secondary | ICD-10-CM | POA: Diagnosis not present

## 2020-02-04 DIAGNOSIS — I6381 Other cerebral infarction due to occlusion or stenosis of small artery: Secondary | ICD-10-CM | POA: Diagnosis not present

## 2020-02-04 DIAGNOSIS — E279 Disorder of adrenal gland, unspecified: Secondary | ICD-10-CM | POA: Diagnosis present

## 2020-02-04 DIAGNOSIS — E1151 Type 2 diabetes mellitus with diabetic peripheral angiopathy without gangrene: Secondary | ICD-10-CM | POA: Diagnosis not present

## 2020-02-04 DIAGNOSIS — E039 Hypothyroidism, unspecified: Secondary | ICD-10-CM | POA: Diagnosis not present

## 2020-02-04 DIAGNOSIS — E1142 Type 2 diabetes mellitus with diabetic polyneuropathy: Secondary | ICD-10-CM | POA: Diagnosis not present

## 2020-02-04 DIAGNOSIS — G4700 Insomnia, unspecified: Secondary | ICD-10-CM | POA: Diagnosis present

## 2020-02-04 DIAGNOSIS — Z23 Encounter for immunization: Secondary | ICD-10-CM | POA: Diagnosis not present

## 2020-02-04 DIAGNOSIS — I639 Cerebral infarction, unspecified: Secondary | ICD-10-CM | POA: Diagnosis not present

## 2020-02-04 DIAGNOSIS — Z79890 Hormone replacement therapy (postmenopausal): Secondary | ICD-10-CM

## 2020-02-04 DIAGNOSIS — Z903 Acquired absence of stomach [part of]: Secondary | ICD-10-CM | POA: Diagnosis not present

## 2020-02-04 DIAGNOSIS — G629 Polyneuropathy, unspecified: Secondary | ICD-10-CM | POA: Diagnosis not present

## 2020-02-04 DIAGNOSIS — E11621 Type 2 diabetes mellitus with foot ulcer: Secondary | ICD-10-CM | POA: Diagnosis present

## 2020-02-04 DIAGNOSIS — I6782 Cerebral ischemia: Secondary | ICD-10-CM | POA: Diagnosis not present

## 2020-02-04 DIAGNOSIS — J449 Chronic obstructive pulmonary disease, unspecified: Secondary | ICD-10-CM | POA: Diagnosis not present

## 2020-02-04 DIAGNOSIS — R29818 Other symptoms and signs involving the nervous system: Secondary | ICD-10-CM | POA: Diagnosis not present

## 2020-02-04 DIAGNOSIS — G894 Chronic pain syndrome: Secondary | ICD-10-CM | POA: Diagnosis present

## 2020-02-04 DIAGNOSIS — Z886 Allergy status to analgesic agent status: Secondary | ICD-10-CM | POA: Diagnosis not present

## 2020-02-04 DIAGNOSIS — G4730 Sleep apnea, unspecified: Secondary | ICD-10-CM | POA: Diagnosis present

## 2020-02-04 DIAGNOSIS — G8911 Acute pain due to trauma: Secondary | ICD-10-CM | POA: Diagnosis not present

## 2020-02-04 DIAGNOSIS — R609 Edema, unspecified: Secondary | ICD-10-CM | POA: Diagnosis not present

## 2020-02-04 DIAGNOSIS — I739 Peripheral vascular disease, unspecified: Secondary | ICD-10-CM | POA: Diagnosis not present

## 2020-02-04 DIAGNOSIS — Z96652 Presence of left artificial knee joint: Secondary | ICD-10-CM | POA: Diagnosis present

## 2020-02-04 DIAGNOSIS — R634 Abnormal weight loss: Secondary | ICD-10-CM | POA: Diagnosis not present

## 2020-02-04 DIAGNOSIS — E785 Hyperlipidemia, unspecified: Secondary | ICD-10-CM | POA: Diagnosis not present

## 2020-02-04 DIAGNOSIS — E559 Vitamin D deficiency, unspecified: Secondary | ICD-10-CM | POA: Diagnosis present

## 2020-02-04 DIAGNOSIS — Z833 Family history of diabetes mellitus: Secondary | ICD-10-CM

## 2020-02-04 DIAGNOSIS — Z9181 History of falling: Secondary | ICD-10-CM

## 2020-02-04 DIAGNOSIS — N183 Chronic kidney disease, stage 3 unspecified: Secondary | ICD-10-CM | POA: Diagnosis present

## 2020-02-04 DIAGNOSIS — I6930 Unspecified sequelae of cerebral infarction: Secondary | ICD-10-CM

## 2020-02-04 DIAGNOSIS — Z818 Family history of other mental and behavioral disorders: Secondary | ICD-10-CM

## 2020-02-04 DIAGNOSIS — Z87442 Personal history of urinary calculi: Secondary | ICD-10-CM

## 2020-02-04 DIAGNOSIS — I480 Paroxysmal atrial fibrillation: Secondary | ICD-10-CM | POA: Diagnosis present

## 2020-02-04 DIAGNOSIS — I6340 Cerebral infarction due to embolism of unspecified cerebral artery: Secondary | ICD-10-CM

## 2020-02-04 DIAGNOSIS — E639 Nutritional deficiency, unspecified: Secondary | ICD-10-CM | POA: Diagnosis not present

## 2020-02-04 DIAGNOSIS — Z90710 Acquired absence of both cervix and uterus: Secondary | ICD-10-CM

## 2020-02-04 DIAGNOSIS — F32A Depression, unspecified: Secondary | ICD-10-CM | POA: Diagnosis present

## 2020-02-04 DIAGNOSIS — R519 Headache, unspecified: Secondary | ICD-10-CM | POA: Diagnosis not present

## 2020-02-04 DIAGNOSIS — E1169 Type 2 diabetes mellitus with other specified complication: Secondary | ICD-10-CM | POA: Diagnosis present

## 2020-02-04 DIAGNOSIS — Z841 Family history of disorders of kidney and ureter: Secondary | ICD-10-CM

## 2020-02-04 DIAGNOSIS — R1084 Generalized abdominal pain: Secondary | ICD-10-CM | POA: Diagnosis not present

## 2020-02-04 DIAGNOSIS — R296 Repeated falls: Secondary | ICD-10-CM

## 2020-02-04 DIAGNOSIS — F419 Anxiety disorder, unspecified: Secondary | ICD-10-CM | POA: Diagnosis present

## 2020-02-04 DIAGNOSIS — Z86718 Personal history of other venous thrombosis and embolism: Secondary | ICD-10-CM | POA: Diagnosis not present

## 2020-02-04 DIAGNOSIS — R29702 NIHSS score 2: Secondary | ICD-10-CM | POA: Diagnosis present

## 2020-02-04 DIAGNOSIS — N3000 Acute cystitis without hematuria: Secondary | ICD-10-CM | POA: Diagnosis present

## 2020-02-04 DIAGNOSIS — E1122 Type 2 diabetes mellitus with diabetic chronic kidney disease: Secondary | ICD-10-CM | POA: Diagnosis present

## 2020-02-04 DIAGNOSIS — D509 Iron deficiency anemia, unspecified: Secondary | ICD-10-CM | POA: Diagnosis present

## 2020-02-04 DIAGNOSIS — Z803 Family history of malignant neoplasm of breast: Secondary | ICD-10-CM

## 2020-02-04 DIAGNOSIS — Z8261 Family history of arthritis: Secondary | ICD-10-CM

## 2020-02-04 DIAGNOSIS — E440 Moderate protein-calorie malnutrition: Secondary | ICD-10-CM | POA: Diagnosis present

## 2020-02-04 DIAGNOSIS — I152 Hypertension secondary to endocrine disorders: Secondary | ICD-10-CM | POA: Diagnosis not present

## 2020-02-04 DIAGNOSIS — E66813 Obesity, class 3: Secondary | ICD-10-CM

## 2020-02-04 DIAGNOSIS — Z808 Family history of malignant neoplasm of other organs or systems: Secondary | ICD-10-CM

## 2020-02-04 DIAGNOSIS — R29810 Facial weakness: Secondary | ICD-10-CM | POA: Diagnosis not present

## 2020-02-04 DIAGNOSIS — E538 Deficiency of other specified B group vitamins: Secondary | ICD-10-CM | POA: Diagnosis present

## 2020-02-04 DIAGNOSIS — G319 Degenerative disease of nervous system, unspecified: Secondary | ICD-10-CM | POA: Diagnosis not present

## 2020-02-04 DIAGNOSIS — R410 Disorientation, unspecified: Secondary | ICD-10-CM | POA: Diagnosis not present

## 2020-02-04 DIAGNOSIS — N200 Calculus of kidney: Secondary | ICD-10-CM | POA: Diagnosis not present

## 2020-02-04 DIAGNOSIS — M19012 Primary osteoarthritis, left shoulder: Secondary | ICD-10-CM | POA: Diagnosis present

## 2020-02-04 DIAGNOSIS — R4182 Altered mental status, unspecified: Secondary | ICD-10-CM

## 2020-02-04 DIAGNOSIS — E1159 Type 2 diabetes mellitus with other circulatory complications: Secondary | ICD-10-CM | POA: Diagnosis present

## 2020-02-04 DIAGNOSIS — Z811 Family history of alcohol abuse and dependence: Secondary | ICD-10-CM

## 2020-02-04 DIAGNOSIS — Z8249 Family history of ischemic heart disease and other diseases of the circulatory system: Secondary | ICD-10-CM

## 2020-02-04 DIAGNOSIS — Z96611 Presence of right artificial shoulder joint: Secondary | ICD-10-CM | POA: Diagnosis present

## 2020-02-04 DIAGNOSIS — I10 Essential (primary) hypertension: Secondary | ICD-10-CM | POA: Diagnosis present

## 2020-02-04 DIAGNOSIS — K449 Diaphragmatic hernia without obstruction or gangrene: Secondary | ICD-10-CM | POA: Diagnosis not present

## 2020-02-04 DIAGNOSIS — R531 Weakness: Secondary | ICD-10-CM

## 2020-02-04 DIAGNOSIS — I872 Venous insufficiency (chronic) (peripheral): Secondary | ICD-10-CM | POA: Diagnosis present

## 2020-02-04 DIAGNOSIS — Z79899 Other long term (current) drug therapy: Secondary | ICD-10-CM

## 2020-02-04 DIAGNOSIS — E278 Other specified disorders of adrenal gland: Secondary | ICD-10-CM | POA: Diagnosis not present

## 2020-02-04 DIAGNOSIS — Z9119 Patient's noncompliance with other medical treatment and regimen: Secondary | ICD-10-CM

## 2020-02-04 DIAGNOSIS — K219 Gastro-esophageal reflux disease without esophagitis: Secondary | ICD-10-CM | POA: Diagnosis present

## 2020-02-04 DIAGNOSIS — Z7401 Bed confinement status: Secondary | ICD-10-CM | POA: Diagnosis not present

## 2020-02-04 DIAGNOSIS — Z20822 Contact with and (suspected) exposure to covid-19: Secondary | ICD-10-CM | POA: Diagnosis present

## 2020-02-04 DIAGNOSIS — Z8614 Personal history of Methicillin resistant Staphylococcus aureus infection: Secondary | ICD-10-CM

## 2020-02-04 DIAGNOSIS — R2681 Unsteadiness on feet: Secondary | ICD-10-CM | POA: Diagnosis present

## 2020-02-04 DIAGNOSIS — M2550 Pain in unspecified joint: Secondary | ICD-10-CM | POA: Diagnosis not present

## 2020-02-04 DIAGNOSIS — L89890 Pressure ulcer of other site, unstageable: Secondary | ICD-10-CM | POA: Diagnosis present

## 2020-02-04 DIAGNOSIS — G2581 Restless legs syndrome: Secondary | ICD-10-CM | POA: Diagnosis present

## 2020-02-04 DIAGNOSIS — I959 Hypotension, unspecified: Secondary | ICD-10-CM | POA: Diagnosis not present

## 2020-02-04 DIAGNOSIS — M7582 Other shoulder lesions, left shoulder: Secondary | ICD-10-CM | POA: Diagnosis not present

## 2020-02-04 DIAGNOSIS — Z7984 Long term (current) use of oral hypoglycemic drugs: Secondary | ICD-10-CM

## 2020-02-04 LAB — BASIC METABOLIC PANEL
Anion gap: 8 (ref 5–15)
BUN: 16 mg/dL (ref 8–23)
CO2: 30 mmol/L (ref 22–32)
Calcium: 8.9 mg/dL (ref 8.9–10.3)
Chloride: 102 mmol/L (ref 98–111)
Creatinine, Ser: 1.01 mg/dL — ABNORMAL HIGH (ref 0.44–1.00)
GFR, Estimated: 60 mL/min — ABNORMAL LOW (ref >60)
Glucose, Bld: 143 mg/dL — ABNORMAL HIGH (ref 70–99)
Potassium: 4.4 mmol/L (ref 3.5–5.1)
Sodium: 140 mmol/L (ref 135–145)

## 2020-02-04 LAB — URINALYSIS, COMPLETE (UACMP) WITH MICROSCOPIC
Bilirubin Urine: NEGATIVE
Glucose, UA: NEGATIVE mg/dL
Ketones, ur: NEGATIVE mg/dL
Nitrite: NEGATIVE
Protein, ur: NEGATIVE mg/dL
Specific Gravity, Urine: 1.011 (ref 1.005–1.030)
WBC, UA: >50 WBC/hpf — ABNORMAL HIGH (ref 0–5)
pH: 7 (ref 5.0–8.0)

## 2020-02-04 LAB — CBC WITH DIFFERENTIAL/PLATELET
Abs Immature Granulocytes: 0.02 10*3/uL (ref 0.00–0.07)
Basophils Absolute: 0.1 10*3/uL (ref 0.0–0.1)
Basophils Relative: 1 %
Eosinophils Absolute: 0.2 10*3/uL (ref 0.0–0.5)
Eosinophils Relative: 2 %
HCT: 33.2 % — ABNORMAL LOW (ref 36.0–46.0)
Hemoglobin: 10.4 g/dL — ABNORMAL LOW (ref 12.0–15.0)
Immature Granulocytes: 0 %
Lymphocytes Relative: 11 %
Lymphs Abs: 0.7 10*3/uL (ref 0.7–4.0)
MCH: 25.7 pg — ABNORMAL LOW (ref 26.0–34.0)
MCHC: 31.3 g/dL (ref 30.0–36.0)
MCV: 82.2 fL (ref 80.0–100.0)
Monocytes Absolute: 0.7 10*3/uL (ref 0.1–1.0)
Monocytes Relative: 11 %
Neutro Abs: 5.3 10*3/uL (ref 1.7–7.7)
Neutrophils Relative %: 75 %
Platelets: 238 10*3/uL (ref 150–400)
RBC: 4.04 MIL/uL (ref 3.87–5.11)
RDW: 17.2 % — ABNORMAL HIGH (ref 11.5–15.5)
WBC: 7 10*3/uL (ref 4.0–10.5)
nRBC: 0 % (ref 0.0–0.2)

## 2020-02-04 LAB — TROPONIN I (HIGH SENSITIVITY): Troponin I (High Sensitivity): 12 ng/L (ref <18)

## 2020-02-04 MED ORDER — LEVOTHYROXINE SODIUM 50 MCG PO TABS
175.0000 ug | ORAL_TABLET | Freq: Every day | ORAL | Status: DC
  Administered 2020-02-05 – 2020-02-10 (×6): 175 ug via ORAL
  Filled 2020-02-04 (×4): qty 1
  Filled 2020-02-04: qty 4
  Filled 2020-02-04: qty 1

## 2020-02-04 MED ORDER — BUPROPION HCL ER (XL) 150 MG PO TB24
150.0000 mg | ORAL_TABLET | Freq: Every day | ORAL | Status: DC
  Administered 2020-02-05 – 2020-02-10 (×6): 150 mg via ORAL
  Filled 2020-02-04 (×6): qty 1

## 2020-02-04 MED ORDER — LOSARTAN POTASSIUM 50 MG PO TABS
50.0000 mg | ORAL_TABLET | Freq: Every day | ORAL | Status: DC
  Administered 2020-02-05: 50 mg via ORAL
  Filled 2020-02-04: qty 1

## 2020-02-04 MED ORDER — SULFAMETHOXAZOLE-TRIMETHOPRIM 800-160 MG PO TABS
1.0000 | ORAL_TABLET | Freq: Two times a day (BID) | ORAL | Status: DC
  Filled 2020-02-04: qty 1

## 2020-02-04 MED ORDER — METHENAMINE MANDELATE 1 G PO TABS
1000.0000 mg | ORAL_TABLET | Freq: Two times a day (BID) | ORAL | Status: DC
  Administered 2020-02-05 – 2020-02-10 (×11): 1000 mg via ORAL
  Filled 2020-02-04 (×13): qty 1

## 2020-02-04 MED ORDER — PREGABALIN 50 MG PO CAPS
50.0000 mg | ORAL_CAPSULE | Freq: Every day | ORAL | Status: DC
  Administered 2020-02-04 – 2020-02-10 (×7): 50 mg via ORAL
  Filled 2020-02-04 (×7): qty 1

## 2020-02-04 MED ORDER — HYDROCHLOROTHIAZIDE 25 MG PO TABS
12.5000 mg | ORAL_TABLET | Freq: Every day | ORAL | Status: DC
  Administered 2020-02-05: 12.5 mg via ORAL
  Filled 2020-02-04: qty 1

## 2020-02-04 MED ORDER — AMLODIPINE BESYLATE 5 MG PO TABS
5.0000 mg | ORAL_TABLET | Freq: Every day | ORAL | Status: DC
  Administered 2020-02-05: 5 mg via ORAL
  Filled 2020-02-04: qty 1

## 2020-02-04 MED ORDER — DULOXETINE HCL 30 MG PO CPEP
60.0000 mg | ORAL_CAPSULE | Freq: Every day | ORAL | Status: DC
  Administered 2020-02-04 – 2020-02-10 (×7): 60 mg via ORAL
  Filled 2020-02-04: qty 2
  Filled 2020-02-04: qty 1
  Filled 2020-02-04 (×3): qty 2
  Filled 2020-02-04: qty 1
  Filled 2020-02-04: qty 2

## 2020-02-04 MED ORDER — COLCHICINE 0.6 MG PO TABS
0.6000 mg | ORAL_TABLET | Freq: Two times a day (BID) | ORAL | Status: DC
  Administered 2020-02-05: 0.6 mg via ORAL
  Filled 2020-02-04 (×5): qty 1

## 2020-02-04 MED ORDER — APIXABAN 5 MG PO TABS
5.0000 mg | ORAL_TABLET | Freq: Two times a day (BID) | ORAL | Status: DC
  Administered 2020-02-04 – 2020-02-10 (×12): 5 mg via ORAL
  Filled 2020-02-04 (×13): qty 1

## 2020-02-04 MED ORDER — PHENAZOPYRIDINE HCL 100 MG PO TABS
95.0000 mg | ORAL_TABLET | Freq: Once | ORAL | Status: DC
  Filled 2020-02-04: qty 1

## 2020-02-04 MED ORDER — PANTOPRAZOLE SODIUM 40 MG PO TBEC
40.0000 mg | DELAYED_RELEASE_TABLET | Freq: Every day | ORAL | Status: DC
  Administered 2020-02-05 – 2020-02-10 (×6): 40 mg via ORAL
  Filled 2020-02-04 (×7): qty 1

## 2020-02-04 MED ORDER — FOSFOMYCIN TROMETHAMINE 3 G PO PACK
3.0000 g | PACK | ORAL | Status: AC
  Administered 2020-02-04: 3 g via ORAL
  Filled 2020-02-04: qty 3

## 2020-02-04 MED ORDER — LINAGLIPTIN 5 MG PO TABS
5.0000 mg | ORAL_TABLET | Freq: Every day | ORAL | Status: DC
  Administered 2020-02-05: 5 mg via ORAL
  Filled 2020-02-04 (×3): qty 1

## 2020-02-04 MED ORDER — MOMETASONE FURO-FORMOTEROL FUM 200-5 MCG/ACT IN AERO
2.0000 | INHALATION_SPRAY | Freq: Two times a day (BID) | RESPIRATORY_TRACT | Status: DC
  Administered 2020-02-05 – 2020-02-10 (×8): 2 via RESPIRATORY_TRACT
  Filled 2020-02-04: qty 8.8

## 2020-02-04 NOTE — ED Notes (Signed)
Unable to obtain bloodwork. 2 unsuccessful attempts in left arm. There are no other sites that can be visualized to use. IV team consult requested.

## 2020-02-04 NOTE — ED Triage Notes (Signed)
Pt to ED via EMS from home, pt is ambulatory at baseline. Family was trying to move her around last night, pt is now having increased pain in her left should, pain in both feet, and bladder spasms. Pt also having trouble controlling her bladder. VSS per EMS. Pt has not taken her medications today. Pt has hx/o A. Fib.

## 2020-02-04 NOTE — ED Provider Notes (Signed)
[facility] Emergency Department Provider Note ____________________________________________   First MD Initiated Contact with Patient 02/04/20 1442     (approximate)  I have reviewed the triage vital signs and the nursing notes.   HISTORY  Chief Complaint Foot Pain and Shoulder Pain  HPI [name] is a 69 y.o. adult with history of CVA, diabetes, DVT, A. fib, and other chronic medical history as listed below presents to the emergency department for treatment and evaluation of left shoulder pain and severe pain to the bottom of both feet that she reports is similar to symptoms of recent "stroke.".  She has also noted some bladder spasms. Symptoms started on Friday and have progressively worsened. Pain in her feet have been so bad she has fallen twice and has had EMS to come help her up. She denies injury associated with falls, but states that her left shoulder is more painful now.          Past Medical History:  Diagnosis Date   Abnormal antibody titer    Anxiety and depression    Arthritis    Asthma    Cystocele    Depression    Diabetes ([place])    DVT (deep venous thrombosis) (HCC)    Righ calf   Dyspnea    11/08/2018 - "more now due to lack of exercise"   Dysrhythmia    afib   Edema    GERD (gastroesophageal reflux disease)    Heart murmur    Echocardiogram June 2015: Mild MR (possible vegetation seen on TTE, not seen on TEE), normal LV size with moderate concentric LVH. Normal function EF 60-65%. Normal diastolic function. Mild LA dilation.   History of IBS    History of kidney stones    History of methicillin resistant staphylococcus aureus (MRSA) 2008   Hyperlipidemia    Hypertension    Hypothyroidism    Insomnia    Kidney stone    kidney stones with lithotripsy .  [facility]- "adrenal glands"   Neuropathy involving both lower extremities    Obesity, Class II, BMI 35-39.9, with comorbidity    Paroxysmal  atrial fibrillation ([place]) 2007   In June 2015, Cardiac Event Monitor: Mostly SR/sinus arrhythmia with PVCs that are frequent. Short bursts of A. fib lasting several minutes;; CHA2DS2-VASc Score = 5 (age, Female, PVD, DM, HTN)   Peripheral vascular disease ([place])    Sleep apnea    use C-PAP   Urinary incontinence    Venous stasis dermatitis of both lower extremities     Patient Active Problem List   Diagnosis Date Noted   Hypertension associated with diabetes ([place]) 47/42/5956   Acute embolic stroke ([place]) 38/75/6433   Chronic bacteriuria 11/17/2019   Hyperkalemia 09/14/2019   Bacteremia due to Gram-positive bacteria 09/08/2019   Sepsis ([place]) 09/08/2019   Toxic encephalopathy 09/08/2019   Pressure injury of skin 09/04/2019   Elevated troponin 09/02/2019   COPD with acute exacerbation ([place]) 09/02/2019   Acute respiratory failure with hypoxia ([place]) 09/02/2019   Pharmacologic therapy 08/30/2019   Recurrent falls 07/10/2019   Left ankle pain 06/02/2019   Chronic pain of right ankle 02/14/2019   Arthritis 02/14/2019   Nausea and vomiting 02/14/2019   Meralgia paresthetica of right side 12/05/2018   Chronic anticoagulation (Pradaxa) 12/05/2018   Peripheral vascular disease ([place]) 10/18/2018   Chronic ulcer of right leg ([place]) 10/18/2018   Fibromyalgia 08/17/2018   Chronic musculoskeletal pain 08/17/2018   Tremor of right hand 08/03/2018  Overactive bladder 08/03/2018   Physical deconditioning 08/03/2018   Foot ulcer, left ([place]) 03/09/2018   Adrenal mass ([place]) 08/02/2017   Eczema 06/27/2017   Diabetic ulcer of left foot ([place]) 06/24/2017   Mass of both adrenal glands ([place]) 05/16/2017   Fatty liver 05/13/2017   Strain of right hip 04/30/2017   Ischial pain, right 04/30/2017   Pain of left hip joint 04/22/2017   Abnormality of gait and mobility 04/22/2017   Muscle strain of left hip 04/09/2017   Anemia 11/18/2016   Asthma 11/18/2016    Carpal tunnel syndrome 11/18/2016   Pain in joint involving ankle and foot 11/18/2016   Obstructive sleep apnea of adult 11/18/2016   Spinal stenosis of thoracic region 11/18/2016   Abscess of tendon of left foot 09/21/2016   Cellulitis 09/05/2016   Cubital tunnel syndrome on right 08/07/2016   Chronic pain syndrome 02/25/2016   Long term current use of opiate analgesic 02/25/2016   Chronic right shoulder pain 02/25/2016   CAP (community acquired pneumonia) 10/11/2015   Status post reverse total shoulder replacement 10/08/2015   Neuropathy of right lateral femoral cutaneous nerve 09/23/2015   Morbid obesity with BMI of 40.0-44.9, adult ([place]) 09/23/2015   Chronic prescription benzodiazepine use 09/23/2015   Arthralgia 09/23/2015   Osteoarthritis involving multiple joints 09/23/2015   Other shoulder lesions (Right) 08/28/2015   Cervical radiculitis 08/06/2015   Fall 05/22/2015   Opiate use (15 MME/Day) 01/16/2015   Impingement syndrome of shoulder region 01/07/2015   Mixed incontinence 11/26/2014   Atrophic vaginitis 11/26/2014   Anxiety and depression 09/21/2014   Bladder cystocele 09/21/2014   Gastro-esophageal reflux disease without esophagitis 09/21/2014   DM (diabetes mellitus) type II, controlled, with peripheral vascular disorder ([place]) 08/31/2014   Diabetic peripheral neuropathy associated with type 2 diabetes mellitus ([place]) 08/31/2014   Asthma, well controlled 08/31/2014   Hypothyroidism, unspecified 08/31/2014   Peripheral vascular disease due to secondary diabetes mellitus ([place]) 12/11/2013   Sleep apnea 10/19/2013   Paroxysmal atrial fibrillation ([place]); CHA2DS2-Vasc Score 5. On Pradaxa 07/27/2013   Essential hypertension 07/27/2013   Chronic atrial fibrillation 08/10/2012    Past Surgical History:  Procedure Laterality Date   ANKLE SURGERY Right    APPLICATION OF WOUND VAC Left 06/27/2015   Procedure: APPLICATION OF WOUND VAC (  POSSIBLE ) ;  Surgeon: [name], MD;  Location: [facility] ORS;  Service: Vascular;  Laterality: Left;   APPLICATION OF WOUND VAC Left 09/25/2016   Procedure: APPLICATION OF WOUND VAC;  Surgeon: [name], DPM;  Location: [facility] ORS;  Service: Podiatry;  Laterality: Left;   arm surgery     right     CHOLECYSTECTOMY     COLONOSCOPY     COLONOSCOPY WITH PROPOFOL N/A 01/11/2017   Procedure: COLONOSCOPY WITH PROPOFOL;  Surgeon: [name], MD;  Location[facility]COPY;  Service: Endoscopy;  Laterality: N/A;   COLONOSCOPY WITH PROPOFOL N/A 02/22/2017   Procedure: COLONOSCOPY WITH PROPOFOL;  Surgeon: [name], MD;  Location[facility] END[facility]COPY;  Service: Endoscopy;  Laterality: N/A;   COLONOSCOPY WITH PROPOFOL N/A 05/31/2017   Procedure: COLONOSCOPY WITH PROPOFOL;  Surgeon: [name], MD;  Location: [facility] ENDOSCOPY;  Service: Endoscopy;  Laterality: N/A;   ESOPHAGOGASTRODUODENOSCOPY (EGD) WITH PROPOFOL N/A 01/11/2017   Procedure: ESOPHAGOGASTRODUODENOSCOPY (EGD) WITH PROPOFOL;  Surgeon: [name], MD;  Location: [facility] ENDOSCOPY;  Service: Endoscopy;  Laterality: N/A;   ESOPHAGOGASTRODUODENOSCOPY (EGD) WITH PROPOFOL N/A 10/25/2018   Procedure: ESOPHAGOGASTRODUODENOSCOPY (EGD) WITH PROPOFOL;  Surgeon: [name], MD;  Location: [facility] ENDOSCOPY;  Service: Endoscopy;  Laterality: N/A;   ESOPHAGOGASTRODUODENOSCOPY (EGD) WITH PROPOFOL N/A 11/29/2019   Procedure: ESOPHAGOGASTRODUODENOSCOPY (EGD) WITH PROPOFOL;  Surgeon: [place], Benay Pike, MD;  Location: [facility] ENDOSCOPY;  Service: Gastroenterology;  Laterality: N/A;   HERNIA REPAIR     umbilical   HIATAL HERNIA REPAIR     I & D EXTREMITY Left 06/27/2015   Procedure: IRRIGATION AND DEBRIDEMENT EXTREMITY            ( CALF HEMATOMA ) POSSIBLE WOUND VAC;  Surgeon: [name], MD;  Location: [facility] ORS;  Service: Vascular;  Laterality: Left;   INCISION AND DRAINAGE OF WOUND Left 09/25/2016   Procedure: IRRIGATION AND  DEBRIDEMENT - PARTIAL RESECTION OF ACHILLES TENDON WITH WOUND VAC APPLICATION;  Surgeon: [name], DPM;  Location: [facility] ORS;  Service: Podiatry;  Laterality: Left;   INCISION AND DRAINAGE OF WOUND Left 11/09/2018   Procedure: Excision of left foot wound with ACell placement;  Surgeon: Wallace Going, DO;  Location: [place];  Service: Plastics;  Laterality: Left;   IRRIGATION AND DEBRIDEMENT ABSCESS Left 09/07/2016   Procedure: IRRIGATION AND DEBRIDEMENT ABSCESS LEFT HEEL;  Surgeon: [name], DPM;  Location: [facility] ORS;  Service: Podiatry;  Laterality: Left;   JOINT REPLACEMENT  2013   left knee replacement   KIDNEY SURGERY Right    kidney stones   LITHOTRIPSY     LOWER EXTREMITY ANGIOGRAPHY Left 04/04/2018   Procedure: LOWER EXTREMITY ANGIOGRAPHY;  Surgeon: [name], MD;  Location: [facility] [facility];  Service: Cardiovascular;  Laterality: Left;   NM GATED MYOVIEW [facility] HX)  February 2017   Likely breast attenuation. LOW RISK study. Normal EF 55-60%.   OTHER SURGICAL HISTORY     08/2016 or 09/2016 surgery on achilles tenso h/o staph infection heal. Dr. [name]   removal of left hematoma Left    leg   REVERSE SHOULDER ARTHROPLASTY Right 10/08/2015   Procedure: REVERSE SHOULDER ARTHROPLASTY;  Surgeon: [name], MD;  Location: [facility] ORS;  Service: Orthopedics;  Laterality: Right;   SLEEVE GASTROPLASTY     TONSILLECTOMY     TOTAL KNEE ARTHROPLASTY Left    TOTAL SHOULDER REPLACEMENT Right    TRANSESOPHAGEAL ECHOCARDIOGRAM  08/08/2013   Mild LVH, EF 60-65%. Moderate LA dilation and mild RA dilation. Mild MR with no evidence of stenosis and no evidence of endocarditis. A false chordae is noted.   TRANSTHORACIC ECHOCARDIOGRAM  08/03/2013   Mild-Moderate concentric LVH, EF 60-65%. Normal diastolic function. Mild LA dilation. Mild MR with possible vegetation  - not confirmed on TEE    ULNAR NERVE TRANSPOSITION Right 08/06/2016   Procedure: ULNAR NERVE  DECOMPRESSION/TRANSPOSITION;  Surgeon: [name], MD;  Location: [facility] ORS;  Service: Orthopedics;  Laterality: Right;   UPPER GI ENDOSCOPY     VAGINAL HYSTERECTOMY      Prior to Admission medications   Medication Sig Start Date End Date Taking? Authorizing Provider  acetaminophen (TYLENOL) 325 MG tablet Take 2 tablets (650 mg total) by mouth every 6 (six) hours as needed for mild pain or moderate pain (or Fever >/= 101). 09/08/19  Yes Guilford Shi, MD  albuterol (PROVENTIL) (2.5 MG/3ML) 0.083% nebulizer solution Take 2.5 mg by nebulization every 4 (four) hours as needed for wheezing or shortness of breath.   Yes [provider]  albuterol (VENTOLIN HFA) 108 (90 Base) MCG/ACT inhaler Inhale 2 puffs into the lungs every 6 (six) hours as  needed for wheezing or shortness of breath.   Yes [provider]  amLODipine (NORVASC) 5 MG tablet Take 1 tablet (5 mg total) by mouth daily. 11/28/19 02/26/20 Yes Loel Dubonnet, NP  apixaban (ELIQUIS) 5 MG TABS tablet Take 1 tablet (5 mg total) by mouth 2 (two) times daily. 01/30/20  Yes Loel Dubonnet, NP  buPROPion (WELLBUTRIN XL) 150 MG 24 hr tablet Take 1 tablet (150 mg total) by mouth daily. 05/02/19  Yes [name], [name], MD  clobetasol cream (TEMOVATE) 4.62 % Apply 1 application topically 2 (two) times daily as needed (skin irritation).    Yes [provider]  colchicine 0.6 MG tablet Take 0.6 mg by mouth 2 (two) times daily as needed (acute gout flare).    Yes [provider]  DULoxetine (CYMBALTA) 60 MG capsule Take 60 mg by mouth daily.   Yes [provider]  ergocalciferol (VITAMIN D2) 1.25 MG (50000 UT) capsule Take 50,000 Units by mouth once a week.   Yes [provider]  fluticasone-salmeterol (ADVAIR HFA) 115-21 MCG/ACT inhaler Inhale 2 puffs into the lungs daily. Rinse mouth thoroughly after use 08/16/19  Yes Tyler Pita, MD  levocetirizine (XYZAL) 5 MG tablet Take 0.5  tablets (2.5 mg total) by mouth every evening. Patient taking differently: Take 5 mg by mouth at bedtime as needed for allergies.  09/07/19  Yes [name], [name], MD  levothyroxine (SYNTHROID) 175 MCG tablet Take 1 tablet (175 mcg total) by mouth daily before breakfast. 03/10/19  Yes [name], [name], MD  losartan-hydrochlorothiazide (HYZAAR) 50-12.5 MG tablet Take 1 tablet by mouth daily.   Yes [provider]  metoprolol succinate (TOPROL-XL) 50 MG 24 hr tablet TAKE 1 TABLET BY MOUTH DAILY Patient taking differently: Take 50 mg by mouth daily.  11/09/19  Yes Loel Dubonnet, NP  montelukast (SINGULAIR) 10 MG tablet Take 1 tablet (10 mg total) by mouth at bedtime. 02/03/19  Yes [name], [name], MD  ondansetron (ZOFRAN) 4 MG tablet Take 1 tablet (4 mg total) by mouth every 8 (eight) hours as needed for nausea or vomiting. 02/14/19  Yes [name], [name], MD  oxybutynin (DITROPAN XL) 10 MG 24 hr tablet Take 10 mg by mouth at bedtime.   Yes [provider]  pantoprazole (PROTONIX) 40 MG tablet Take 40 mg by mouth daily.  04/07/19 04/06/20 Yes [provider]  pregabalin (LYRICA) 50 MG capsule Take 1 capsule (50 mg total) by mouth 3 (three) times daily. 12/27/19 06/24/20 Yes Sharen Hones, MD  rosuvastatin (CRESTOR) 20 MG tablet Take 1 tablet (20 mg total) by mouth daily. 01/30/20 07/28/20 Yes Loel Dubonnet, NP  sitaGLIPtin (JANUVIA) 25 MG tablet Take 1 tablet (25 mg total) by mouth daily. 12/26/19  Yes [name], [name], MD  sucralfate (CARAFATE) 1 GM/10ML suspension Take 1 g by mouth 3 (three) times daily.  12/06/19  Yes [provider]  TOVIAZ 8 MG TB24 tablet TAKE ONE TABLET BY MOUTH EVERY DAY Patient taking differently: Take 8 mg by mouth daily.  08/07/19  Yes McGowan, [name] A, PA-C  trospium (SANCTURA) 20 MG tablet Take 20 mg by mouth 2 (two) times daily.   Yes [provider]  estradiol (ESTRACE) 0.1 MG/GM vaginal  cream Place 1 Applicatorful vaginally every Monday, Wednesday, and Friday. Apply 0.5mg  (pea-sized amount)  just inside the vaginal introitus with a finger-tip on Monday, Wednesday and Friday nights, 02/01/19   Zara Council A, PA-C  HYDROcodone-acetaminophen (NORCO/VICODIN) 5-325  MG tablet Take 1 tablet by mouth every 8 (eight) hours as needed for severe pain. Must last 30 days 12/27/19 01/26/20  Sharen Hones, MD  metFORMIN (GLUCOPHAGE) 500 MG tablet Take 1 tablet (500 mg total) by mouth 2 (two) times daily with a meal. With food Patient not taking: Reported on 02/04/2020 12/26/19   [name], [name], MD    Allergies Morphine and related, Penicillins, Ambien [zolpidem], Aspirin, and Oxytetracycline  Family History  Problem Relation Age of Onset   Skin cancer Father    Diabetes Father    Hypertension Father    Peripheral vascular disease Father    Cancer Father    Cerebral aneurysm Father    Alcohol abuse Father    Varicose Veins Mother    Kidney disease Mother    Arthritis Mother    Mental illness Sister    Cancer Maternal Aunt        breast   Breast cancer Maternal Aunt    Arthritis Maternal Grandmother    Hypertension Maternal Grandmother    Diabetes Maternal Grandmother    Arthritis Maternal Grandfather    Heart disease Maternal Grandfather    Hypertension Maternal Grandfather    Arthritis Paternal Grandmother    Hypertension Paternal Grandmother    Diabetes Paternal Grandmother    Arthritis Paternal Grandfather    Hypertension Paternal Grandfather    Bladder Cancer Neg Hx    Kidney cancer Neg Hx     Social History Social History   Tobacco Use   Smoking status: Never Smoker   Smokeless tobacco: Never Used  Scientific laboratory technician Use: Never used  Substance Use Topics   Alcohol use: No    Alcohol/week: 0.0 standard drinks   Drug use: Never    Review of Systems  Constitutional: No fever/chills Eyes: No visual changes. ENT:  No sore throat. Cardiovascular: Denies chest pain. Respiratory: Denies shortness of breath above chronic. Gastrointestinal: No abdominal pain.  No nausea, no vomiting.  No diarrhea.  No constipation. Genitourinary: Negative for dysuria. Musculoskeletal: Positive for left shoulder pain and bilateral foot pain. Skin: Negative for rash. Neurological:Positive for headaches, negative for focal weakness or numbness. ____________________________________________   PHYSICAL EXAM:  VITAL SIGNS: ED Triage Vitals [02/04/20 1428]  Enc Vitals Group     BP      Pulse      Resp      Temp      Temp src      SpO2      Weight 240 lb 4.8 oz (109 kg)     Height 5\' 5"  (1.651 m)     Head Circumference      Peak Flow      Pain Score 8     Pain Loc      Pain Edu?      Excl. in [place]?     Constitutional: Alert and oriented. Chronically ill appearing and in no acute distress. Eyes: Conjunctivae are normal.  Head: Atraumatic. Nose: No congestion/rhinnorhea. Mouth/Throat: Mucous membranes are moist.  Oropharynx non-erythematous.  Neck: No stridor.   Hematological/Lymphatic/Immunilogical: No cervical lymphadenopathy. Cardiovascular: Normal rate, regular rhythm. Grossly normal heart sounds.  Good peripheral circulation. Respiratory: Normal respiratory effort.  No retractions. Lungs CTAB. Gastrointestinal: Soft and nontender. No distention. No abdominal bruits. Genitourinary: Suprapubic tenderness. Musculoskeletal: No tenderness of lower extremities including feet.  Neurologic:  Normal speech and language. No gross focal neurologic deficits are appreciated. Tongue protrudes midline. Equal smile. No pronator drift on right. Motor  and sensation of bilateral lower extremities is intact. Unable to raise left arm due to shoulder pain. Skin:  Skin is warm, dry and intact. No wounds/lesions on feet. Psychiatric: Tearful.  ____________________________________________   LABS (all labs ordered are listed, but  only abnormal results are displayed)  Labs Reviewed  BASIC METABOLIC PANEL - Abnormal; Notable for the following components:      Result Value   Glucose, Bld 143 (*)    Creatinine, Ser 1.01 (*)    GFR, Estimated 60 (*)    All other components within normal limits  CBC WITH DIFFERENTIAL/PLATELET - Abnormal; Notable for the following components:   Hemoglobin 10.4 (*)    HCT 33.2 (*)    MCH 25.7 (*)    RDW 17.2 (*)    All other components within normal limits  URINALYSIS, COMPLETE (UACMP) WITH MICROSCOPIC - Abnormal; Notable for the following components:   Color, Urine YELLOW (*)    APPearance CLOUDY (*)    Hgb urine dipstick SMALL (*)    Leukocytes,Ua LARGE (*)    WBC, UA >50 (*)    Bacteria, UA FEW (*)    All other components within normal limits  TROPONIN I (HIGH SENSITIVITY)  TROPONIN I (HIGH SENSITIVITY)   ____________________________________________  EKG  Not indicated. ____________________________________________  RADIOLOGY  ED MD interpretation:    Head CT negative for new or acute findings per radiology.  I, Sherrie George, personally viewed and evaluated these images (plain radiographs) as part of my medical decision making, as well as reviewing the written report by the radiologist.  Official radiology report(s): CT Head Wo Contrast  Result Date: 02/04/2020 CLINICAL DATA:  New onset of headache. EXAM: CT HEAD WITHOUT CONTRAST TECHNIQUE: Contiguous axial images were obtained from the base of the skull through the vertex without intravenous contrast. COMPARISON:  December 21, 2019 head CT December 22, 2019, head MRI FINDINGS: Brain: No evidence of acute infarction, hemorrhage, hydrocephalus, extra-axial collection or mass lesion/mass effect. Advanced deep white matter microangiopathy. Mild brain parenchymal volume loss. Stable areas of hypoattenuation when compared to patient's most recent brain MRI, reflecting chronic ischemic infarctions, the largest in the right  centrum semiovale. Vascular: No hyperdense vessel or unexpected calcification. Skull: Normal. Negative for fracture or focal lesion. Sinuses/Orbits: No acute finding. Other: None. IMPRESSION: 1. No acute intracranial abnormality. 2. Advanced deep white matter microangiopathy. 3. Stable areas of hypoattenuation when compared to patient's most recent brain MRI, reflecting chronic ischemic infarctions, the largest in the right centrum semiovale. Electronically Signed   By: Fidela Salisbury M.D.   On: 02/04/2020 17:27    ____________________________________________   PROCEDURES  Procedure(s) performed (including Critical Care):  Procedures  ____________________________________________   INITIAL IMPRESSION / ASSESSMENT AND PLAN     69 year old female presenting to the emergency department for treatment and evaluation of symptoms as described in the HPI.  She was admitted on December 21, 2019 after presenting to the emergency department for altered mental status with confusion, generalized weakness worse on the left upper and lower extremity, recurrent dizziness, and expressive dysphagia.  Head CT showed stable, mild generalized cerebral atrophy and moderate chronic small vessel ischemic disease but no acute findings.  MRI of the brain revealed several small acute infarcts in both cerebral hemispheres without hemorrhage or mass-effect most consistent with acute embolic event.  Upon discharge, she went to [employer] and received some occupational and physical therapy.  The extremity weakness, especially the left shoulder, improved.  She states that she believes  that she "did too much" on Friday which may have provoked presenting symptoms today.  She states that the pain in the plantar aspects of her feet is so severe that she just cannot stand.  DIFFERENTIAL DIAGNOSIS  Neuropathy, left shoulder injury post fall, acute cystitis.  ED COURSE  CT scan of the head is negative for acute  findings.  Patient has no neurological deficits of concern that would warrant additional imaging.  Labs are also reassuring.  Urinalysis is pending.  Urinalysis shows a white blood cell count of greater than 50 with large amount leukocytes and few bacteria.  Will treat for UTI.  Plan for disposition will be to consult social work and locate skilled nursing facility as the patient and family do not feel that she is able to return home at this time.  Home medications ordered.    ___________________________________________   FINAL CLINICAL IMPRESSION(S) / ED DIAGNOSES  Final diagnoses:  Acute cystitis without hematuria     ED Discharge Orders    None       [name] was evaluated in Emergency Department on 02/04/2020 for the symptoms described in the history of present illness. She was evaluated in the context of the global COVID-19 pandemic, which necessitated consideration that the patient might be at risk for infection with the SARS-CoV-2 virus that causes COVID-19. Institutional protocols and algorithms that pertain to the evaluation of patients at risk for COVID-19 are in a state of rapid change based on information released by regulatory bodies including the CDC and federal and state organizations. These policies and algorithms were followed during the patient's care in the ED.   Note:  This document was prepared using Dragon voice recognition software and may include unintentional dictation errors.   Victorino Dike, FNP 02/04/20 2049    Delman Kitten, MD 02/04/20 2209

## 2020-02-04 NOTE — ED Notes (Signed)
Daughter (sebrena) called to check on mother. Advised her that at this time she is still being assessed by the provider but is stable. Daughter began to get upset, stating "why have we not done an MRI yet", and saying that she could have "had a stroke and we do not know it". She stated that at 4 hours waiting we should have better answers. I did advise after approval from the patient, of what tests that we have done at this point and that the patient is not symptomatic of a stroke or any other life threatening issue. I advised that we are still assessing the patient and that we do not have a definitive diagnosis at this time. Daughter was unhappy with this answer and advised that she wanted an MRI done ASAP. I advised her that I cannot personally order an MRI and I would pass the information along to the provider. Patient stated that she is going to report myself and the provider for not providing adequate care to her mother. I advised her that we definitely are following all protocols, and that when the provider has time I would have her to contact her.

## 2020-02-04 NOTE — ED Notes (Signed)
Patient tearful when attempting to get EKG. Unable to move left arm at all from chest due to shoulder pain. Refused to attempt to straighten due to pain. Will attempt to perform later.

## 2020-02-05 ENCOUNTER — Telehealth: Payer: Self-pay | Admitting: Internal Medicine

## 2020-02-05 ENCOUNTER — Emergency Department: Payer: PPO

## 2020-02-05 DIAGNOSIS — Z79890 Hormone replacement therapy (postmenopausal): Secondary | ICD-10-CM | POA: Diagnosis not present

## 2020-02-05 DIAGNOSIS — E039 Hypothyroidism, unspecified: Secondary | ICD-10-CM | POA: Diagnosis present

## 2020-02-05 DIAGNOSIS — L97429 Non-pressure chronic ulcer of left heel and midfoot with unspecified severity: Secondary | ICD-10-CM | POA: Diagnosis present

## 2020-02-05 DIAGNOSIS — K219 Gastro-esophageal reflux disease without esophagitis: Secondary | ICD-10-CM

## 2020-02-05 DIAGNOSIS — E1169 Type 2 diabetes mellitus with other specified complication: Secondary | ICD-10-CM | POA: Diagnosis present

## 2020-02-05 DIAGNOSIS — E11621 Type 2 diabetes mellitus with foot ulcer: Secondary | ICD-10-CM | POA: Diagnosis present

## 2020-02-05 DIAGNOSIS — E559 Vitamin D deficiency, unspecified: Secondary | ICD-10-CM | POA: Diagnosis not present

## 2020-02-05 DIAGNOSIS — G629 Polyneuropathy, unspecified: Secondary | ICD-10-CM | POA: Diagnosis not present

## 2020-02-05 DIAGNOSIS — Z6841 Body Mass Index (BMI) 40.0 and over, adult: Secondary | ICD-10-CM

## 2020-02-05 DIAGNOSIS — R531 Weakness: Secondary | ICD-10-CM

## 2020-02-05 DIAGNOSIS — G894 Chronic pain syndrome: Secondary | ICD-10-CM

## 2020-02-05 DIAGNOSIS — G4733 Obstructive sleep apnea (adult) (pediatric): Secondary | ICD-10-CM | POA: Diagnosis present

## 2020-02-05 DIAGNOSIS — E785 Hyperlipidemia, unspecified: Secondary | ICD-10-CM | POA: Diagnosis present

## 2020-02-05 DIAGNOSIS — E440 Moderate protein-calorie malnutrition: Secondary | ICD-10-CM | POA: Diagnosis present

## 2020-02-05 DIAGNOSIS — E1159 Type 2 diabetes mellitus with other circulatory complications: Secondary | ICD-10-CM

## 2020-02-05 DIAGNOSIS — R296 Repeated falls: Secondary | ICD-10-CM

## 2020-02-05 DIAGNOSIS — J449 Chronic obstructive pulmonary disease, unspecified: Secondary | ICD-10-CM | POA: Diagnosis present

## 2020-02-05 DIAGNOSIS — E1142 Type 2 diabetes mellitus with diabetic polyneuropathy: Secondary | ICD-10-CM | POA: Diagnosis present

## 2020-02-05 DIAGNOSIS — E1151 Type 2 diabetes mellitus with diabetic peripheral angiopathy without gangrene: Secondary | ICD-10-CM

## 2020-02-05 DIAGNOSIS — E639 Nutritional deficiency, unspecified: Secondary | ICD-10-CM | POA: Diagnosis not present

## 2020-02-05 DIAGNOSIS — I152 Hypertension secondary to endocrine disorders: Secondary | ICD-10-CM

## 2020-02-05 DIAGNOSIS — I10 Essential (primary) hypertension: Secondary | ICD-10-CM

## 2020-02-05 DIAGNOSIS — I639 Cerebral infarction, unspecified: Secondary | ICD-10-CM | POA: Diagnosis present

## 2020-02-05 DIAGNOSIS — I480 Paroxysmal atrial fibrillation: Secondary | ICD-10-CM

## 2020-02-05 DIAGNOSIS — E279 Disorder of adrenal gland, unspecified: Secondary | ICD-10-CM | POA: Diagnosis present

## 2020-02-05 DIAGNOSIS — I6340 Cerebral infarction due to embolism of unspecified cerebral artery: Secondary | ICD-10-CM | POA: Diagnosis present

## 2020-02-05 DIAGNOSIS — N3000 Acute cystitis without hematuria: Secondary | ICD-10-CM | POA: Diagnosis present

## 2020-02-05 DIAGNOSIS — Z886 Allergy status to analgesic agent status: Secondary | ICD-10-CM | POA: Diagnosis not present

## 2020-02-05 DIAGNOSIS — Z903 Acquired absence of stomach [part of]: Secondary | ICD-10-CM | POA: Diagnosis not present

## 2020-02-05 DIAGNOSIS — Z23 Encounter for immunization: Secondary | ICD-10-CM | POA: Diagnosis not present

## 2020-02-05 DIAGNOSIS — Z86718 Personal history of other venous thrombosis and embolism: Secondary | ICD-10-CM | POA: Diagnosis not present

## 2020-02-05 DIAGNOSIS — Z20822 Contact with and (suspected) exposure to covid-19: Secondary | ICD-10-CM | POA: Diagnosis present

## 2020-02-05 LAB — CBG MONITORING, ED: Glucose-Capillary: 222 mg/dL — ABNORMAL HIGH (ref 70–99)

## 2020-02-05 LAB — RESPIRATORY PANEL BY RT PCR (FLU A&B, COVID)
Influenza A by PCR: NEGATIVE
Influenza B by PCR: NEGATIVE
SARS Coronavirus 2 by RT PCR: NEGATIVE

## 2020-02-05 MED ORDER — ALPRAZOLAM 0.5 MG PO TABS
0.5000 mg | ORAL_TABLET | Freq: Once | ORAL | Status: AC
  Administered 2020-02-05: 0.5 mg via ORAL
  Filled 2020-02-05: qty 1

## 2020-02-05 MED ORDER — INSULIN ASPART 100 UNIT/ML ~~LOC~~ SOLN
0.0000 [IU] | SUBCUTANEOUS | Status: DC
  Administered 2020-02-05: 3 [IU] via SUBCUTANEOUS
  Administered 2020-02-06 (×2): 2 [IU] via SUBCUTANEOUS
  Filled 2020-02-05 (×3): qty 1

## 2020-02-05 MED ORDER — SUCRALFATE 1 GM/10ML PO SUSP
1.0000 g | Freq: Three times a day (TID) | ORAL | Status: DC
  Administered 2020-02-05 – 2020-02-10 (×19): 1 g via ORAL
  Filled 2020-02-05 (×22): qty 10

## 2020-02-05 MED ORDER — ACETAMINOPHEN 500 MG PO TABS
1000.0000 mg | ORAL_TABLET | Freq: Once | ORAL | Status: AC
  Administered 2020-02-05: 1000 mg via ORAL
  Filled 2020-02-05: qty 2

## 2020-02-05 MED ORDER — HYDROCODONE-ACETAMINOPHEN 5-325 MG PO TABS
1.0000 | ORAL_TABLET | Freq: Once | ORAL | Status: AC
  Administered 2020-02-05: 1 via ORAL
  Filled 2020-02-05: qty 1

## 2020-02-05 NOTE — Telephone Encounter (Signed)
Patient's husband left his cell number to call [phone]

## 2020-02-05 NOTE — ED Notes (Signed)
Pt continues sleeping at this time. NAD noted.

## 2020-02-05 NOTE — ED Notes (Signed)
Admit MD at bedside

## 2020-02-05 NOTE — H&P (Addendum)
[name] YCX:[id number] DOB: [birth year] DOA: 02/04/2020     PCP: [name], [name], MD   Outpatient Specialists:  CARDS:  [facility] [place]   [facility] [place]   Patient arrived to ER on 02/04/20 at 1415 Referred by Attending Duffy Bruce, MD   Patient coming from: home Lives   With family    Chief Complaint:   Chief Complaint  Patient presents with  . Foot Pain  . Shoulder Pain    HPI: [name] is a 69 y.o. adult with medical history significant of recent admission for acute embolic stroke, paroxysmal atrial fibrillation on Eliquis, DM2, morbid obesity, chronic pain syndrome Previous DVT sleep apnea on CPAP  Presented with   Increased confusion, falls from home on 02/04/2020 Patient was also endorsing increased pain throughout in her left shoulder and feet.  As well as some bladder spasms As well as bladder incontinence family initially was suspected urinary tract infection But later on also suggested that when patient had similar presentation in the past she ended had a stroke  Last admit was 1 m ago for stroke her Pradaxa was switched to Eliquis MRI of the brain did not show any large vessel occlusion, ultrasound no signal stenosis LDL was 44 She was discharged on Eliquis and statin patient is allergic to aspirin Patient had initial left-sided weakness Initially discharged to SNF but now back home again  Reports her symptoms with prior CVa was difficulty with walking and she has had this for the past few days again Unsure of exact onset  Infectious risk factors:  Reports fatigue   Has  been vaccinated against COVID ( wishes to get her booster while hospitalized if possible)   Initial COVID TEST  NEGATIVE   Lab Results  Component Value Date   [place] 02/05/2020   [place] NEGATIVE 12/27/2019   [place] NEGATIVE 12/23/2019   [place] NEGATIVE 11/28/2019    Regarding pertinent Chronic problems:     Hyperlipidemia -   on  statins Crestor Lipid Panel     Component Value Date/Time   CHOL 122 12/23/2019 0644   CHOL 185 10/10/2014 0822   TRIG 77 12/23/2019 0644   HDL 63 12/23/2019 0644   HDL 94 10/10/2014 0822   CHOLHDL 1.9 12/23/2019 0644   VLDL 15 12/23/2019 0644   LDLCALC 44 12/23/2019 0644   LDLCALC 73 10/10/2014 0822   LABVLDL 18 10/10/2014 0822     HTN on Norvasc losartan/hydrochlorothiazide Toprol  Last echogram June 2021 showed preserved EF     DM 2 -  Lab Results  Component Value Date   HGBA1C 6.9 (H) 12/23/2019   on PO meds only, Januvia   Hypothyroidism:  Lab Results  Component Value Date   TSH 2.051 07/29/2018   on synthroid   obesity-   BMI Readings from Last 1 Encounters:  02/04/20 39.99 kg/m       COPD - not  followed by pulmonology     OSA -on nocturnal  CPAP, difficulty with compliance    Hx of CVA -  With  residual deficits on Eliquis does not tolerate aspirin    A. Fib -  - CHA2DS2 vas score   6         current  on anticoagulation with  Eliquis,           -  Rate control:  Currently controlled with  Toprolol,      CKD stage III - baseline Cr 1.1 Estimated Creatinine  Clearance (by C-G formula based on SCr of 1.01 mg/dL (H)) Female: 63.7 mL/min (A) Female: 77.5 mL/min (A)  Lab Results  Component Value Date   CREATININE 1.01 (H) 02/04/2020   CREATININE 1.12 (H) 12/25/2019   CREATININE 1.06 (H) 12/24/2019       Chronic anemia - baseline hg Hemoglobin & Hematocrit  Recent Labs    12/24/19 0534 12/25/19 0308 02/04/20 1645  HGB 9.8* 9.5* 10.4*     While in ER: Initially patient was being observed for possible placement Started on  Fosfamycine for UTI Family has notified that she has had similar presentations in the past for CVA requesting MRI MRI showed new CVA  persistent A.fib Patient already on Eliquis Outside of TPA window and already on anticoagulation not a candidate for TPA Hospitalist was called for admission for new CVA  The following  Work up has been ordered so far:  Orders Placed This Encounter  Procedures  . Urine culture  . Respiratory Panel by RT PCR (Flu A&B, Covid) - Nasopharyngeal Swab  . CT Head Wo Contrast  . MR BRAIN WO CONTRAST  . Basic metabolic panel  . CBC with Differential  . Urinalysis, Complete w Microscopic  . Diet regular Room service appropriate? Yes; Fluid consistency: Thin  . Face-to-face encounter (required for Medicare/Medicaid patients)  . Home Health  . Stroke swallow screen  . Consult to Transition of Care Team (SW and CM)  . Consult to hospitalist  . OT eval and treat  . OT PLAN OF CARE CERT/RE-CERT  . Patient needs PT consult as soon as possible.  Discharge is pending PT consult.  Please call  PT eval and treat  . ED EKG     Following Medications were ordered in ER: Medications  amLODipine (NORVASC) tablet 5 mg (5 mg Oral Given 02/05/20 1033)  apixaban (ELIQUIS) tablet 5 mg (5 mg Oral Given 02/05/20 1033)  buPROPion (WELLBUTRIN XL) 24 hr tablet 150 mg (150 mg Oral Given 02/05/20 1033)  levothyroxine (SYNTHROID) tablet 175 mcg (175 mcg Oral Given 02/05/20 0809)  mometasone-formoterol (DULERA) 200-5 MCG/ACT inhaler 2 puff (2 puffs Inhalation Refused 02/05/20 2039)  colchicine tablet 0.6 mg (0.6 mg Oral Given 02/05/20 1034)  DULoxetine (CYMBALTA) DR capsule 60 mg (60 mg Oral Given 02/05/20 1033)  losartan (COZAAR) tablet 50 mg (50 mg Oral Given 02/05/20 1034)  hydrochlorothiazide (HYDRODIURIL) tablet 12.5 mg (12.5 mg Oral Given 02/05/20 1033)  pantoprazole (PROTONIX) EC tablet 40 mg (40 mg Oral Given 02/05/20 1103)  pregabalin (LYRICA) capsule 50 mg (50 mg Oral Given 02/05/20 1033)  linagliptin (TRADJENTA) tablet 5 mg (5 mg Oral Given 02/05/20 1035)  methenamine (MANDELAMINE) tablet 1,000 mg (1,000 mg Oral Given 02/05/20 1103)  sucralfate (CARAFATE) 1 GM/10ML suspension 1 g (1 g Oral Given 02/05/20 2004)  fosfomycin (MONUROL) packet 3 g (3 g Oral Given 02/04/20 2224)  ALPRAZolam  (XANAX) tablet 0.5 mg (0.5 mg Oral Given 02/05/20 0354)  acetaminophen (TYLENOL) tablet 1,000 mg (1,000 mg Oral Given 02/05/20 1515)  HYDROcodone-acetaminophen (NORCO/VICODIN) 5-325 MG per tablet 1 tablet (1 tablet Oral Given 02/05/20 1701)        Consult Orders  (From admission, onward)         Start     Ordered   02/05/20 1938  Consult to hospitalist  Once       Provider:  (Not yet assigned)  Question Answer Comment  Place call to: ED, callback [phone]   Reason for Consult Admit   Diagnosis/Clinical Info  for Consult: acute CVA, weakness      02/05/20 1937   02/05/20 1109  OT eval and treat  Routine        02/05/20 1108   02/04/20 2313  Patient needs PT consult as soon as possible.  Discharge is pending PT consult.  Please call  PT eval and treat  Imminent discharge       Comments: Patient needs PT consult as soon as possible.  Discharge is pending PT consult.  Please call   02/04/20 2313          Significant initial  Findings: Abnormal Labs Reviewed  BASIC METABOLIC PANEL - Abnormal; Notable for the following components:      Result Value   Glucose, Bld 143 (*)    Creatinine, Ser 1.01 (*)    GFR, Estimated 60 (*)    All other components within normal limits  CBC WITH DIFFERENTIAL/PLATELET - Abnormal; Notable for the following components:   Hemoglobin 10.4 (*)    HCT 33.2 (*)    MCH 25.7 (*)    RDW 17.2 (*)    All other components within normal limits  URINALYSIS, COMPLETE (UACMP) WITH MICROSCOPIC - Abnormal; Notable for the following components:   Color, Urine YELLOW (*)    APPearance CLOUDY (*)    Hgb urine dipstick SMALL (*)    Leukocytes,Ua LARGE (*)    WBC, UA >50 (*)    Bacteria, UA FEW (*)    All other components within normal limits    Otherwise labs showing:   Recent Labs  Lab 02/04/20 1645  NA 140  K 4.4  CO2 30  GLUCOSE 143*  BUN 16  CREATININE 1.01*  CALCIUM 8.9   Cr    Stable,  Lab Results  Component Value Date   CREATININE 1.01 (H)  02/04/2020   CREATININE 1.12 (H) 12/25/2019   CREATININE 1.06 (H) 12/24/2019    No results for input(s): AST, ALT, ALKPHOS, BILITOT, PROT, ALBUMIN in the last 168 hours. Lab Results  Component Value Date   CALCIUM 8.9 02/04/2020   PHOS 3.4 08/12/2012     WBC      Component Value Date/Time   WBC 7.0 02/04/2020 1645   LYMPHSABS 0.7 02/04/2020 1645   LYMPHSABS 1.2 10/10/2014 0822   LYMPHSABS 1.4 09/28/2013 0510   MONOABS 0.7 02/04/2020 1645   MONOABS 0.5 09/28/2013 0510   EOSABS 0.2 02/04/2020 1645   EOSABS 0.4 10/10/2014 0822   EOSABS 0.4 09/28/2013 0510   BASOSABS 0.1 02/04/2020 1645   BASOSABS 0.1 10/10/2014 0822   BASOSABS 0.1 09/28/2013 0510   BASOSABS 0 09/29/2012 1500   Plt: Lab Results  Component Value Date   PLT 238 02/04/2020    COVID-19 Labs  No results for input(s): DDIMER, FERRITIN, LDH, CRP in the last 72 hours.  Lab Results  Component Value Date   SARSCOV2NAA NEGATIVE 02/05/2020   SARSCOV2NAA NEGATIVE 12/27/2019   [place] NEGATIVE 12/23/2019   [place] NEGATIVE 11/28/2019     HG/HCT  Stable,     Component Value Date/Time   HGB 10.4 (L) 02/04/2020 1645   HGB 10.9 (L) 11/09/2019 1055   HCT 33.2 (L) 02/04/2020 1645   HCT 33.9 (L) 11/09/2019 1055   MCV 82.2 02/04/2020 1645   MCV 81 11/09/2019 1055   MCV 94 09/28/2013 0510    No results for input(s): LIPASE, AMYLASE in the last 168 hours. No results for input(s): AMMONIA in the last 168 hours.    Troponin 12  ECG: Ordered Personally reviewed by me showing: HR : 66 Rhythm:  NSR,    no evidence of ischemic changes QTC 468   BNP (last 3 results) Recent Labs    09/02/19 1044  BNP 384.0*    DM  labs:  HbA1C: Recent Labs    09/02/19 1557 12/23/19 0644  HGBA1C 6.8* 6.9*     CBG (last 3)  No results for input(s): GLUCAP in the last 72 hours.     UA  Increased leukocytes  Urine analysis:    Component Value Date/Time   COLORURINE YELLOW (A) 02/04/2020 1537   APPEARANCEUR  CLOUDY (A) 02/04/2020 1537   APPEARANCEUR Clear 09/18/2019 1437   LABSPEC 1.011 02/04/2020 1537   PHURINE 7.0 02/04/2020 1537   GLUCOSEU NEGATIVE 02/04/2020 1537   GLUCOSEU NEGATIVE 08/02/2017 1427   HGBUR SMALL (A) 02/04/2020 1537   BILIRUBINUR NEGATIVE 02/04/2020 1537   BILIRUBINUR Negative 09/18/2019 1437   BILIRUBINUR 1+ 09/15/2017 1319   KETONESUR NEGATIVE 02/04/2020 1537   PROTEINUR NEGATIVE 02/04/2020 1537   UROBILINOGEN 0.2 09/15/2017 1319   UROBILINOGEN 0.2 08/02/2017 1427   NITRITE NEGATIVE 02/04/2020 1537   LEUKOCYTESUR LARGE (A) 02/04/2020 1537    Ordered  CT HEAD Advanced deep white matter microangiopathy.   CXR -  NON acute ? early interstitial edema  MRI  - new CVA   ED Triage Vitals  Enc Vitals Group     BP 02/04/20 1500 (!) 116/102     Pulse Rate 02/04/20 1500 62     Resp 02/04/20 1500 (!) 25     Temp 02/04/20 1735 98 F (36.7 C)     Temp Source 02/04/20 1735 Oral     SpO2 02/04/20 1500 98 %     Weight 02/04/20 1428 240 lb 4.8 oz (109 kg)     Height 02/04/20 1428 5\' 5"  (1.651 m)     Head Circumference --      Peak Flow --      Pain Score 02/04/20 1428 8     Pain Loc --      Pain Edu? --      Excl. in [place]? --   TMAX(24)@       Latest  Blood pressure (!) 99/54, pulse 63, temperature 98 F (36.7 C), temperature source Oral, resp. rate (!) 22, height 5\' 5"  (1.651 m), weight 109 kg, SpO2 96 %.     Review of Systems:    Pertinent positives include:   , fatigue trouble walking confusion Constitutional:  No weight loss, night sweats, Fevers, chills, weight loss  HEENT:  No headaches, Difficulty swallowing,Tooth/dental problems,Sore throat,  No sneezing, itching, ear ache, nasal congestion, post nasal drip,  Cardio-vascular:  No chest pain, Orthopnea, PND, anasarca, dizziness, palpitations.no Bilateral lower extremity swelling  GI:  No heartburn, indigestion, abdominal pain, nausea, vomiting, diarrhea, change in bowel habits, loss of appetite,  melena, blood in stool, hematemesis Resp:  no shortness of breath at rest. No dyspnea on exertion, No excess mucus, no productive cough, No non-productive cough, No coughing up of blood.No change in color of mucus.No wheezing. Skin:  no rash or lesions. No jaundice GU:  no dysuria, change in color of urine, no urgency or frequency. No straining to urinate.  No flank pain.  Musculoskeletal:  No joint pain or no joint swelling. No decreased range of motion. No back pain.  Psych:  No change in mood or affect. No depression or anxiety. No memory loss.  Neuro: no localizing neurological complaints,  no tingling, no weakness, no double vision, no gait abnormality, no slurred speech,   All systems reviewed and apart from [place] all are negative  Past Medical History:   Past Medical History:  Diagnosis Date  . Abnormal antibody titer   . Anxiety and depression   . Arthritis   . Asthma   . Cystocele   . Depression   . Diabetes ([place])   . DVT (deep venous thrombosis) (HCC)    Righ calf  . Dyspnea    11/08/2018 - "more now due to lack of exercise"  . Dysrhythmia    afib  . Edema   . GERD (gastroesophageal reflux disease)   . Heart murmur    Echocardiogram June 2015: Mild MR (possible vegetation seen on TTE, not seen on TEE), normal LV size with moderate concentric LVH. Normal function EF 60-65%. Normal diastolic function. Mild LA dilation.  Marland Kitchen History of IBS   . History of kidney stones   . History of methicillin resistant staphylococcus aureus (MRSA) 2008  . Hyperlipidemia   . Hypertension   . Hypothyroidism   . Insomnia   . Kidney stone    kidney stones with lithotripsy .  [facility]- "adrenal glands"  . Neuropathy involving both lower extremities   . Obesity, Class II, BMI 35-39.9, with comorbidity   . Paroxysmal atrial fibrillation ([place]) 2007   In June 2015, Cardiac Event Monitor: Mostly SR/sinus arrhythmia with PVCs that are frequent. Short bursts of A. fib lasting several  minutes;; CHA2DS2-VASc Score = 5 (age, Female, PVD, DM, HTN)  . Peripheral vascular disease ([place])   . Sleep apnea    use C-PAP  . Urinary incontinence   . Venous stasis dermatitis of both lower extremities      Past Surgical History:  Procedure Laterality Date  . ANKLE SURGERY Right   . APPLICATION OF WOUND VAC Left 06/27/2015   Procedure: APPLICATION OF WOUND VAC ( POSSIBLE ) ;  Surgeon: [name], MD;  Location: [facility] ORS;  Service: Vascular;  Laterality: Left;  . APPLICATION OF WOUND VAC Left 09/25/2016   Procedure: APPLICATION OF WOUND VAC;  Surgeon: [name], DPM;  Location: [facility] ORS;  Service: Podiatry;  Laterality: Left;  . arm surgery     right    . CHOLECYSTECTOMY    . COLONOSCOPY    . COLONOSCOPY WITH PROPOFOL N/A 01/11/2017   Procedure: COLONOSCOPY WITH PROPOFOL;  Surgeon: [name], MD;  Location: [facility] ENDOSCOPY;  Service: Endoscopy;  Laterality: N/A;  . COLONOSCOPY WITH PROPOFOL N/A 02/22/2017   Procedure: COLONOSCOPY WITH PROPOFOL;  Surgeon: [name], MD;  Location: [facility] ENDOSCOPY;  Service: Endoscopy;  Laterality: N/A;  . COLONOSCOPY WITH PROPOFOL N/A 05/31/2017   Procedure: COLONOSCOPY WITH PROPOFOL;  Surgeon: [name], MD;  Location[facility];  Service: Endoscopy;  Laterality: N/A;  . ESOPHAGOGASTRODUODENOSCOPY (EGD) WITH PROPOFOL N/A 01/11/2017   Procedure: ESOPHAGOGASTRODUODENOSCOPY (EGD) WITH PROPOFOL;  Surgeon: [name], MD;  Location: [facility] ENDOSCOPY;  Service: Endoscopy;  Laterality: N/A;  . ESOPHAGOGASTRODUODENOSCOPY (EGD) WITH PROPOFOL N/A 10/25/2018   Procedure: ESOPHAGOGASTRODUODENOSCOPY (EGD) WITH PROPOFOL;  Surgeon: [name], MD;  Location: [facility] ENDOSCOPY;  Service: Endoscopy;  Laterality: N/A;  . ESOPHAGOGASTRODUODENOSCOPY (EGD) WITH PROPOFOL N/A 11/29/2019   Procedure: ESOPHAGOGASTRODUODENOSCOPY (EGD) WITH PROPOFOL;  Surgeon: [place], Benay Pike, MD;  Location: [facility] ENDOSCOPY;  Service: Gastroenterology;   Laterality: N/A;  . HERNIA REPAIR     umbilical  . HIATAL HERNIA REPAIR    .  I & D EXTREMITY Left 06/27/2015   Procedure: IRRIGATION AND DEBRIDEMENT EXTREMITY            ( CALF HEMATOMA ) POSSIBLE WOUND VAC;  Surgeon: [name], MD;  Location: [facility] ORS;  Service: Vascular;  Laterality: Left;  . INCISION AND DRAINAGE OF WOUND Left 09/25/2016   Procedure: IRRIGATION AND DEBRIDEMENT - PARTIAL RESECTION OF ACHILLES TENDON WITH WOUND VAC APPLICATION;  Surgeon: [name], DPM;  Location: [facility] ORS;  Service: Podiatry;  Laterality: Left;  . INCISION AND DRAINAGE OF WOUND Left 11/09/2018   Procedure: Excision of left foot wound with ACell placement;  Surgeon: Wallace Going, DO;  Location: [place];  Service: Plastics;  Laterality: Left;  . IRRIGATION AND DEBRIDEMENT ABSCESS Left 09/07/2016   Procedure: IRRIGATION AND DEBRIDEMENT ABSCESS LEFT HEEL;  Surgeon: [name], DPM;  Location: [facility] ORS;  Service: Podiatry;  Laterality: Left;  . JOINT REPLACEMENT  2013   left knee replacement  . KIDNEY SURGERY Right    kidney stones  . LITHOTRIPSY    . LOWER EXTREMITY ANGIOGRAPHY Left 04/04/2018   Procedure: LOWER EXTREMITY ANGIOGRAPHY;  Surgeon: [name], MD;  Location: [place] [facility];  Service: Cardiovascular;  Laterality: Left;  . NM GATED MYOVIEW ([place] HX)  February 2017   Likely breast attenuation. LOW RISK study. Normal EF 55-60%.  . OTHER SURGICAL HISTORY     08/2016 or 09/2016 surgery on achilles tenso h/o staph infection heal. Dr. [name]  . removal of left hematoma Left    leg  . REVERSE SHOULDER ARTHROPLASTY Right 10/08/2015   Procedure: REVERSE SHOULDER ARTHROPLASTY;  Surgeon: [name], MD;  Location: [facility] ORS;  Service: Orthopedics;  Laterality: Right;  . SLEEVE GASTROPLASTY    . TONSILLECTOMY    . TOTAL KNEE ARTHROPLASTY Left   . TOTAL SHOULDER REPLACEMENT Right   . TRANSESOPHAGEAL ECHOCARDIOGRAM  08/08/2013   Mild LVH, EF 60-65%. Moderate LA dilation and mild RA  dilation. Mild MR with no evidence of stenosis and no evidence of endocarditis. A false chordae is noted.  . TRANSTHORACIC ECHOCARDIOGRAM  08/03/2013   Mild-Moderate concentric LVH, EF 60-65%. Normal diastolic function. Mild LA dilation. Mild MR with possible vegetation  - not confirmed on TEE   . ULNAR NERVE TRANSPOSITION Right 08/06/2016   Procedure: ULNAR NERVE DECOMPRESSION/TRANSPOSITION;  Surgeon: [name], MD;  Location: [facility] ORS;  Service: Orthopedics;  Laterality: Right;  . UPPER GI ENDOSCOPY    . VAGINAL HYSTERECTOMY      Social History:  Ambulatory walker      reports that she has never smoked. She has never used smokeless tobacco. She reports that she does not drink alcohol and does not use drugs.   Family History:   Family History  Problem Relation Age of Onset  . Skin cancer Father   . Diabetes Father   . Hypertension Father   . Peripheral vascular disease Father   . Cancer Father   . Cerebral aneurysm Father   . Alcohol abuse Father   . Varicose Veins Mother   . Kidney disease Mother   . Arthritis Mother   . Mental illness Sister   . Cancer Maternal Aunt        breast  . Breast cancer Maternal Aunt   . Arthritis Maternal Grandmother   . Hypertension Maternal Grandmother   . Diabetes Maternal Grandmother   . Arthritis Maternal Grandfather   . Heart disease Maternal Grandfather   . Hypertension Maternal  Grandfather   . Arthritis Paternal Grandmother   . Hypertension Paternal Grandmother   . Diabetes Paternal Grandmother   . Arthritis Paternal Grandfather   . Hypertension Paternal Grandfather   . Bladder Cancer Neg Hx   . Kidney cancer Neg Hx     Allergies: Allergies  Allergen Reactions  . Morphine And Related Other (See Comments) and Nausea Only    Patient becomes very confused Other reaction(s): Other (See Comments), Other (See Comments) Patient becomes very confused Patient becomes very confused   . Penicillins Hives, Shortness Of Breath,  Swelling and Other (See Comments)    Facial swelling Has patient had a PCN reaction causing immediate rash, facial/tongue/throat swelling, SOB or lightheadedness with hypotension: Yes Has patient had a PCN reaction causing severe rash involving mucus membranes or skin necrosis: Yes Has patient had a PCN reaction that required hospitalization No Has patient had a PCN reaction occurring within the last 10 years: No If all of the above answers are "NO", then may proceed with Cephalosporin use.  Other reaction(s): Other (See Comments), Other (See Comments) Facial swelling Has patient had a PCN reaction causing immediate rash, facial/tongue/throat swelling, SOB or lightheadedness with hypotension: Yes Has patient had a PCN reaction causing severe rash involving mucus membranes or skin necrosis: Yes Has patient had a PCN reaction that required hospitalization No Has patient had a PCN reaction occurring within the last 10 years: No If all of the above answers are "NO", then may proceed with Cephalosporin use. Facial swelling Has patient had a PCN reaction causing immediate rash, facial/tongue/throat swelling, SOB or lightheadedness with hypotension: Yes Has patient had a PCN reaction causing severe rash involving mucus membranes or skin necrosis: Yes Has patient had a PCN reaction that required hospitalization No Has patient had a PCN reaction occurring within the last 10 years: No If all of the above answers are "NO", then may proceed with Cephalosporin use. Facial swelling Has patient had a PCN reaction causing immediate rash, facial/tongue/throat swelling, SOB or lightheadedness with hypotension: Yes Has patient had a PCN reaction causing severe rash involving mucus membranes or skin necrosis: Yes Has patient had a PCN reaction that required hospitalization No Has patient had a PCN reaction occurring within the last 10 years: No If all of the above answers are "NO", then may proceed with  Cephalosporin use.   . Ambien [Zolpidem]     Hallucination   . Aspirin Hives  . Oxytetracycline Hives    Other reaction(s): Unknown      Prior to Admission medications   Medication Sig Start Date End Date Taking? Authorizing Provider  acetaminophen (TYLENOL) 325 MG tablet Take 2 tablets (650 mg total) by mouth every 6 (six) hours as needed for mild pain or moderate pain (or Fever >/= 101). 09/08/19  Yes [place] Shi, MD  albuterol (PROVENTIL) (2.5 MG/3ML) 0.083% nebulizer solution Take 2.5 mg by nebulization every 4 (four) hours as needed for wheezing or shortness of breath.   Yes [provider]  albuterol (VENTOLIN HFA) 108 (90 Base) MCG/ACT inhaler Inhale 2 puffs into the lungs every 6 (six) hours as needed for wheezing or shortness of breath.   Yes [provider]  ALPRAZolam Duanne Moron) 0.5 MG tablet Take 0.5 mg by mouth at bedtime as needed for anxiety or sleep.   Yes [provider]  amLODipine (NORVASC) 5 MG tablet Take 1 tablet (5 mg total) by mouth daily. 11/28/19 02/26/20 Yes Loel Dubonnet, NP  apixaban (ELIQUIS) 5 MG  TABS tablet Take 1 tablet (5 mg total) by mouth 2 (two) times daily. 01/30/20  Yes Loel Dubonnet, NP  buPROPion (WELLBUTRIN XL) 150 MG 24 hr tablet Take 1 tablet (150 mg total) by mouth daily. 05/02/19  Yes [name], [name], MD  clobetasol cream (TEMOVATE) 1.51 % Apply 1 application topically 2 (two) times daily as needed (skin irritation).    Yes [provider]  colchicine 0.6 MG tablet Take 0.6 mg by mouth 2 (two) times daily as needed (acute gout flare).    Yes [provider]  DULoxetine (CYMBALTA) 60 MG capsule Take 60 mg by mouth daily.   Yes [provider]  ergocalciferol (VITAMIN D2) 1.25 MG (50000 UT) capsule Take 50,000 Units by mouth once a week.   Yes [provider]  fluticasone-salmeterol (ADVAIR HFA) 115-21 MCG/ACT inhaler Inhale 2 puffs into the lungs daily. Rinse mouth  thoroughly after use 08/16/19  Yes Tyler Pita, MD  levocetirizine (XYZAL) 5 MG tablet Take 0.5 tablets (2.5 mg total) by mouth every evening. Patient taking differently: Take 5 mg by mouth at bedtime as needed for allergies.  09/07/19  Yes [name], [name], MD  levothyroxine (SYNTHROID) 175 MCG tablet Take 1 tablet (175 mcg total) by mouth daily before breakfast. 03/10/19  Yes [name], [name], MD  losartan-hydrochlorothiazide (HYZAAR) 50-12.5 MG tablet Take 1 tablet by mouth daily.   Yes [provider]  metoprolol succinate (TOPROL-XL) 50 MG 24 hr tablet TAKE 1 TABLET BY MOUTH DAILY Patient taking differently: Take 50 mg by mouth daily.  11/09/19  Yes Loel Dubonnet, NP  montelukast (SINGULAIR) 10 MG tablet Take 1 tablet (10 mg total) by mouth at bedtime. 02/03/19  Yes [name], [name], MD  ondansetron (ZOFRAN) 4 MG tablet Take 1 tablet (4 mg total) by mouth every 8 (eight) hours as needed for nausea or vomiting. 02/14/19  Yes [name], [name], MD  oxybutynin (DITROPAN XL) 10 MG 24 hr tablet Take 10 mg by mouth at bedtime.   Yes [provider]  pantoprazole (PROTONIX) 40 MG tablet Take 40 mg by mouth daily.  04/07/19 04/06/20 Yes [provider]  pregabalin (LYRICA) 50 MG capsule Take 1 capsule (50 mg total) by mouth 3 (three) times daily. 12/27/19 06/24/20 Yes Sharen Hones, MD  rosuvastatin (CRESTOR) 20 MG tablet Take 1 tablet (20 mg total) by mouth daily. 01/30/20 07/28/20 Yes Loel Dubonnet, NP  sitaGLIPtin (JANUVIA) 25 MG tablet Take 1 tablet (25 mg total) by mouth daily. 12/26/19  Yes [name], [name], MD  sucralfate (CARAFATE) 1 GM/10ML suspension Take 1 g by mouth 3 (three) times daily.  12/06/19  Yes [provider]  TOVIAZ 8 MG TB24 tablet TAKE ONE TABLET BY MOUTH EVERY DAY Patient taking differently: Take 8 mg by mouth daily.  08/07/19  Yes McGowan, [name] A, PA-C  trospium (SANCTURA) 20 MG tablet Take 20 mg by  mouth 2 (two) times daily.   Yes [provider]  estradiol (ESTRACE) 0.1 MG/GM vaginal cream Place 1 Applicatorful vaginally every Monday, Wednesday, and Friday. Apply 0.5mg  (pea-sized amount)  just inside the vaginal introitus with a finger-tip on Monday, Wednesday and Friday nights, 02/01/19   McGowan, [name] A, PA-C  HYDROcodone-acetaminophen (NORCO/VICODIN) 5-325 MG tablet Take 1 tablet by mouth every 8 (eight) hours as needed for severe pain. Must last 30 days 12/27/19 01/26/20  Sharen Hones, MD  metFORMIN (GLUCOPHAGE) 500 MG tablet Take 1 tablet (500 mg total) by mouth  2 (two) times daily with a meal. With food Patient not taking: Reported on 02/04/2020 12/26/19   [name], [name], MD   Physical Exam: Vitals with BMI 02/05/2020 02/05/2020 02/05/2020  Height - - -  Weight - - -  BMI - - -  Systolic 99 017 98  Diastolic 54 64 44  Pulse 63 - 72     1. General:  in No  Acute distress   Chronically ill  -appearing 2. Psychological: Alert and Oriented 3. Head/ENT:    Dry Mucous Membranes                          Head Non traumatic, neck supple                           Poor Dentition 4. SKIN:   decreased Skin turgor,  Skin clean Dry and intact , red rash under panus folds 5. Heart: Regular rate and rhythm no  Murmur, no Rub or gallop 6. Lungs:   no wheezes or crackles   7. Abdomen: Soft, non-tender, Non distended   Obese bowel sounds present 8. Lower extremities: no clubbing, cyanosis, no edema 9. Neurologically mild left leg weakness residual from prior CVA  10. MSK: Normal range of motion except in LEft arm limited due to pain   All other LABS:     Recent Labs  Lab 02/04/20 1645  WBC 7.0  NEUTROABS 5.3  HGB 10.4*  HCT 33.2*  MCV 82.2  PLT 238     Recent Labs  Lab 02/04/20 1645  NA 140  K 4.4  CL 102  CO2 30  GLUCOSE 143*  BUN 16  CREATININE 1.01*  CALCIUM 8.9     No results for input(s): AST, ALT, ALKPHOS, BILITOT, PROT, ALBUMIN in the  last 168 hours.     Cultures:    Component Value Date/Time   SDES  12/20/2019 0142    URINE, RANDOM Performed at [facility], [address] [place], [place] [address]    [facility]  12/20/2019 0142    NONE Performed at [facility], [address] [place], [place] [address]    CULT MULTIPLE SPECIES PRESENT, SUGGEST RECOLLECTION (A) 12/20/2019 0142   REPTSTATUS 12/20/2019 FINAL 12/20/2019 0142     Radiological Exams on Admission: CT Head Wo Contrast  Result Date: 02/04/2020 CLINICAL DATA:  New onset of headache. EXAM: CT HEAD WITHOUT CONTRAST TECHNIQUE: Contiguous axial images were obtained from the base of the skull through the vertex without intravenous contrast. COMPARISON:  December 21, 2019 head CT December 22, 2019, head MRI FINDINGS: Brain: No evidence of acute infarction, hemorrhage, hydrocephalus, extra-axial collection or mass lesion/mass effect. Advanced deep white matter microangiopathy. Mild brain parenchymal volume loss. Stable areas of hypoattenuation when compared to patient's most recent brain MRI, reflecting chronic ischemic infarctions, the largest in the right centrum semiovale. Vascular: No hyperdense vessel or unexpected calcification. Skull: Normal. Negative for fracture or focal lesion. Sinuses/Orbits: No acute finding. Other: None. IMPRESSION: 1. No acute intracranial abnormality. 2. Advanced deep white matter microangiopathy. 3. Stable areas of hypoattenuation when compared to patient's most recent brain MRI, reflecting chronic ischemic infarctions, the largest in the right centrum semiovale. Electronically Signed   By: Fidela Salisbury M.D.   On: 02/04/2020 17:27   MR BRAIN WO CONTRAST  Result Date: 02/05/2020 CLINICAL DATA:  Neuro deficit, acute, stroke suspected. EXAM: MRI HEAD WITHOUT  CONTRAST TECHNIQUE: Multiplanar, multiecho pulse sequences of the brain and surrounding structures were obtained without intravenous contrast.  COMPARISON:  Noncontrast head CT 02/04/2020. MRI/MRA head 12/22/2019. FINDINGS: Brain: Mild-to-moderate generalized cerebral atrophy. New from the prior MRI of 12/22/2019, there is a 3 mm cortical focus of restricted diffusion within the posterior left frontal lobe consistent with acute infarction (series 9, image 34). Also new from this prior exam, there is a 2 mm acute infarct within the left frontal lobe periventricular white matter (series 9, image 32). Unchanged small scattered chronic cortical infarcts, most notably within the right parietal lobe (series 15, image 35). Background severe scattered and confluent T2/FLAIR hyperintensity within the cerebral white matter which is nonspecific, but compatible with chronic small vessel ischemic disease. This includes multiple chronic cerebral white matter small vessel infarcts. A chronic small-vessel infarct within the left centrum semiovale is new as compared to the prior MRI (series 9, image 33). Prominent perivascular spaces within the bilateral basal ganglia with likely superimposed chronic lacunar infarcts, unchanged. Redemonstrated chronic lacunar infarcts within the bilateral thalami. Comparatively mild chronic small vessel ischemic changes within the pons. There are few nonspecific scattered supratentorial chronic microhemorrhages, unchanged. No evidence of intracranial mass. No extra-axial fluid collection. No midline shift. Vascular: Expected proximal arterial flow voids. Skull and upper cervical spine: No focal marrow lesion. Sinuses/Orbits: Visualized orbits show no acute finding. Minimal left maxillary sinus mucosal thickening at the imaged levels. Trace left mastoid effusion. IMPRESSION: 3 mm acute cortical infarct within the posterior left frontal lobe. 2 mm acute infarct within the left frontal lobe periventricular white matter. Chronic small-vessel infarct within the left centrum semiovale, new as compared to MRI of 12/22/2019. Cerebral atrophy and  severe chronic small vessel ischemic disease with multiple chronic infarcts, as detailed and otherwise unchanged. Electronically Signed   By: Kellie Simmering DO   On: 02/05/2020 18:49    Chart has been reviewed    Assessment/Plan  69 y.o. adult with medical history significant of recent admission for acute embolic stroke, paroxysmal atrial fibrillation on Eliquis, DM2, morbid obesity, chronic pain syndrome Previous DVT sleep apnea on CPAP Admitted for new CVA, UTi  Present on Admission: . Acute embolic stroke ([place]) -  - will admit based on TIA/CVA protocol,         MRA/MRI   Resulted - showing acute ischemic CVA x2 small      Carotid Doppler done during last admit       Echo to evaluate for possible embolic source,        obtain cardiac enzymes,  ECG,   Lipid panel, recently done TSH.        Order PT/OT evaluation.        keep nothing by mouth until passes swallow eval         Will make sure patient is on   Statin continue Eliquis she is allergic to aspirin       Allow permissive Hypertension keep BP <220/120        Neurology consult in Am   . Paroxysmal atrial fibrillation ([place]); CHA2DS2-Vasc Score 5. On eliquis Continue home medications  . Hypothyroidism, unspecified -- Check TSH continue home medications at current dose  . Hypertension associated with diabetes ([place]) -allow permissive hypertension for tonight given recent CVA and soft blood pressures in ER  . Gastro-esophageal reflux disease without esophagitis -chronic stable continue home meds   . DM (diabetes mellitus) type II, controlled, with peripheral vascular disorder ([place]) -  -  Order Sensitive SSI    -  check TSH and HgA1C  - Hold by mouth medications    . Chronic pain syndrome restart home medications when able   Uti - PEn allergic got one dose of Fosfomycin in ER lready       await results of urine culture and adjust antibiotic coverage as needed  . Sleep apnea - restart CPAP  Other plan as per orders.  DVT  prophylaxis:      apixaban (ELIQUIS) tablet 5 mg    Code Status:    Code Status: Prior FULL CODE   as per patient  I had personally discussed CODE STATUS with patient      Family Communication:   Family not at  Bedside   Disposition Plan:    likely will need placement for rehabilitation       Patient would like to get her COvid vaccination booster ASAP                      Following barriers for discharge:                                                      Will likely need home health, home O2, set up                           Will need consultants to evaluate patient prior to discharge                     Would benefit from PT/OT eval prior to DC  Ordered                   Swallow eval - SLP ordered                                     Transition of care consulted                   Nutrition    consulted                   Consults called: Please consult Neurology in AM    Admission status:  ED Disposition    ED Disposition Condition [place]: [place] [[id number]]  Level of Care: Med-Surg [16]  Covid Evaluation: Confirmed COVID Negative  Diagnosis: Stroke [facility]) [[id number]]  Admitting Physician: Toy Baker [[id number]]  Attending Physician: Toy Baker [[id number]]  Estimated length of stay: past midnight tomorrow  Certification:: I certify this patient will need inpatient services for at least 2 midnights         inpatient     I Expect 2 midnight stay secondary to severity of patient's current illness need for inpatient interventions justified by the following:  hemodynamic instability despite optimal treatment ( hypotension )   Severe lab/radiological/exam abnormalities including:    new CVA and extensive comorbidities including:  Chronic pain  DM2    COPD/asthma  Morbid Obesity   history of stroke with residual deficits .  Chronic anticoagulation  That are currently affecting medical management.   I expect   patient to be hospitalized for 2 midnights  requiring inpatient medical care.  Patient is at high risk for adverse outcome (such as loss of life or disability) if not treated.  Indication for inpatient stay as follows:    Hemodynamic instability despite maximal medical therapy,    Need for  IV fluids     Level of care    tele    indefinitely please discontinue once patient no longer qualifies COVID-19 Labs    Lab Results  Component Value Date   [place] NEGATIVE 02/05/2020     Precautions: admitted as Covid Negative     PPE: Used by the provider:   P100  eye Goggles,  Gloves     Alysia Scism 02/06/2020, 12:15 AM    [facility]     after 2 AM please page floor coverage PA If 7AM-7PM, please contact the day team taking care of the patient using [email or web address]   Patient was evaluated in the context of the global COVID-19 pandemic, which necessitated consideration that the patient might be at risk for infection with the SARS-CoV-2 virus that causes COVID-19. Institutional protocols and algorithms that pertain to the evaluation of patients at risk for COVID-19 are in a state of rapid change based on information released by regulatory bodies including the CDC and federal and state organizations. These policies and algorithms were followed during the patient's care.

## 2020-02-05 NOTE — Evaluation (Signed)
Occupational Therapy Evaluation Patient Details Name: [name] MRN: [id number] DOB: 1950/12/27 Today's Date: 02/05/2020    History of Present Illness [name] is a 3yoF who comes [facility] on 11[facility]14 c Lt shoulder pain, bilat pedal pain, bladder spasm. Pt was recently at [place] at [facility], has been home for 9 days, initially getting around well, AMB in house with RW. Per RN, pt has not been taking lyrica, which pt says is for her neuropathy, formerly seeing pain management. PMH includes chronic left achilles wound followed by podiatry, chronic Left diabetic heel ulcer followed by podiatry, PVD, chronic left shoulder DJD/pain; Rt shoulder OA s/p surgery.   Clinical Impression   [name] was seen for OT evaluation this date. Prior to hospital admission, pt was recently home from SNF stay - reports household ambulation c RW for ~1 week followed by 3 days SPT to chair c assistance. Pt reports not on chronic pain medication at this time - eval limited by pain this session. Pt lives c husband and has assistance from grandchildren PRN. Pt presents to acute OT demonstrating impaired ADL performance and functional mobility 2/2 pain, decreased activity tolerance, and functional strength deficits. Pt currently requires MOD A bed mobility from flat bed; pt unable to use LUE functionally - reports at baseline husband pulls on L arm for bed mobility. LUE guarded and AROM limited - pt reports baseline. SBA seated EOB for static sitting balance c single UE suported and feet unsupported. MOD A don B boots seated EOB - assist 2/2 high stretcher height and feet unsupported. Unable to attempt transfers this date - pt reports 10/10 B foot pain in [place]. Pt would benefit from skilled OT to address noted impairments and functional limitations (see below for any additional details) in order to maximize safety and independence while minimizing falls risk and caregiver burden. Upon hospital discharge, recommend  Supervision and HHOT - if continues to be pain limited pt may need LTC.     Follow Up Recommendations  Supervision/Assistance - 24 hour;LTACH    Equipment Recommendations  3 in 1 bedside commode    Recommendations for Other Services       Precautions / Restrictions Precautions Precautions: Fall Restrictions Weight Bearing Restrictions: No      Mobility Bed Mobility Overal bed mobility: Needs Assistance Bed Mobility: Supine to Sit;Sit to Supine     Supine to sit: Mod assist Sit to supine: Mod assist   General bed mobility comments: Assist from flat bed - pt unable to use LUE functionally - reports at baseline husband pulls on L arm for bed mobility    Transfers        General transfer comment: unable to attempt - pt reports 10/10 B foot pain in WBing    Balance Overall balance assessment: Mild deficits observed, not formally tested        ADL either performed or assessed with clinical judgement   ADL Overall ADL's : Needs assistance/impaired      General ADL Comments: SUPERVISION seated ADLs. MOD A don B boots at EOB.                   Pertinent Vitals/Pain Pain Assessment: Faces Faces Pain Scale: Hurts worst Pain Location: B feet c WBing Pain Descriptors / Indicators: Aching;Burning Pain Intervention(s): Limited activity within patient's tolerance;Monitored during session;Repositioned     Hand Dominance Right   Extremity/Trunk Assessment Upper Extremity Assessment Upper Extremity Assessment: Generalized weakness;LUE deficits/detail LUE Deficits / Details:  Guarded. ~15* AROM grossly LUE: Unable to fully assess due to pain   Lower Extremity Assessment Lower Extremity Assessment: Generalized weakness       Communication Communication Communication: No difficulties   Cognition Arousal/Alertness: Awake/alert Behavior During Therapy: WFL for tasks assessed/performed Overall Cognitive Status: Within Functional Limits for tasks assessed       General Comments       Exercises Exercises: Other exercises Other Exercises Other Exercises: Pt educated re: OT role, DME recs, d/c recs, falls prevention, ECS, pain mgmt Other Exercises: LBD, sup<>sit, sitting balance/tolerance   Shoulder Instructions      Home Living Family/patient expects to be discharged to:: Private residence Living Arrangements: Spouse/significant other Available Help at Discharge: Family;Available 24 hours/day Type of Home: House Home Access: Ramped entrance     Home Layout: One level     Bathroom Shower/Tub: Teacher, early years/pre: Handicapped height     Home Equipment: Environmental consultant - 2 wheels;Walker - 4 wheels;Bedside commode          Prior Functioning/Environment Level of Independence: Needs assistance  Gait / Transfers Assistance Needed: household AMB with RW since home from STR; SPT <>BSC past ~3 days. Endorses 3 falls in last 3 days.  ADL's / Homemaking Assistance Needed: Seated MOD I grooming, assist for LBD            OT Problem List: Decreased activity tolerance;Pain      OT Treatment/Interventions: Self-care/ADL training    OT Goals(Current goals can be found in the care plan section) Acute Rehab OT Goals Patient Stated Goal: improve pain in feet to tolerate transfers OT Goal Formulation: With patient Time For Goal Achievement: 02/19/20 Potential to Achieve Goals: Fair ADL Goals Pt Will Perform Grooming: with modified independence;sitting Pt Will Transfer to Toilet: with max assist;stand pivot transfer;bedside commode  OT Frequency: Min 1X/week    AM-PAC OT "6 Clicks" Daily Activity     Outcome Measure Help from another person eating meals?: None Help from another person taking care of personal grooming?: A Little Help from another person toileting, which includes using toliet, bedpan, or urinal?: A Lot Help from another person bathing (including washing, rinsing, drying)?: A Lot Help from another person to put on  and taking off regular upper body clothing?: A Little Help from another person to put on and taking off regular lower body clothing?: A Lot 6 Click Score: 16   End of Session Nurse Communication: Mobility status  Activity Tolerance: Patient limited by pain Patient left: in bed;with call bell/phone within reach  OT Visit Diagnosis: Other abnormalities of gait and mobility (R26.89)                Time: 6503-5465 OT Time Calculation (min): 26 min Charges:  OT General Charges $OT Visit: 1 Visit OT Evaluation $OT Eval Moderate Complexity: 1 Mod OT Treatments $Self Care/Home Management : 8-22 mins  Dessie Coma, M.S. OTR/L  02/05/20, 5:02 PM  ascom [phone]

## 2020-02-05 NOTE — ED Notes (Signed)
Pt to MRI

## 2020-02-05 NOTE — TOC Progression Note (Signed)
Transition of Care [facility]) - Progression Note    Patient Details  Name: ALYSABETH SCALIA MRN: [id number] Date of Birth: [birth year]  Transition of Care [facility]) CM/SW [place], [place] Phone Number: [phone] 02/05/2020, 3:21 PM  Clinical Narrative:     CSW spoke with patient and spouse Dellis Filbert.  CSW explained TOC role in patient care and updated them on [facility] recommendation from PT.  Mr. Hinckley stated he didn't agree with the PT and he wanted the patient to go to SNF.  CSW explained unless they were willing to pay for private pay SNF, I would not be abel to assist in a SNF placement due to PT recommendation.  CSW explained about HH and SNF recommendations and insurance coverage.  Mr. Troost stated he could not lift the patient and how was he supposed to take care of her.  CSW explained they possibility of using private caregivers a few hours of the day.  Mr. Kilman stated they are not able to afford that.  This CSW stated we would not be able assist with finding private care.  Patient and Mr. Screws verbalized understanding.  Mr. Speranza stated he was not in agreement with PT recommendation and wanted to speak with EDP.  CSW statedshe would let the EDP know.  CSW spoke with patient's daughter [name] [phone].  Ms. Lucas Mallow insisted the patient needed to have a MRI.  CSW stated she would let the EDP know and would request for him to contact her.  Ms. Lucas Mallow verbalized understanding.       Expected Discharge Plan and Services                                                 Social Determinants of Health (SDOH) Interventions    Readmission Risk Interventions Readmission Risk Prevention Plan 07/30/2018  Transportation Screening Complete  PCP or Specialist Appt within 3-5 Days Complete  HRI or [place] Complete  Social Work Consult for [place] Planning/Counseling Patient refused  Palliative Care Screening Patient Refused  Medication  Review Press photographer) Complete  Some recent data might be hidden

## 2020-02-05 NOTE — ED Notes (Signed)
OT at bedside. 

## 2020-02-05 NOTE — ED Provider Notes (Signed)
-----------------------------------------   3:03 PM on 02/05/2020 -----------------------------------------  I spoke to the patient and the patient's daughter.  Given the patient's acute onset of weakness we will repeat an MRI in the emergency department as the patient's last MRI in September showed embolic showering.  If the patient has a new/acute CVA I would anticipate likely admission to the hospital service for further work-up and ultimate rehab placement.  If the MRI is negative for acute stroke family is in agreement the patient can be discharged home with home health.  I have filled out home health paperwork/face-to-face for the patient.  Patient care signed out to oncoming provider.   Harvest Dark, MD 02/05/20 1504

## 2020-02-05 NOTE — Telephone Encounter (Signed)
Please advise on what they should do? 

## 2020-02-05 NOTE — ED Notes (Signed)
RN called into room by pt. Pt requested her daughter [name] be removed from contacts and no information to be share with her. Registration notified and individual removed from contacts.

## 2020-02-05 NOTE — ED Notes (Signed)
Physical therapy advised patient was concerned about not getting breakfast. There was no diet ordered. Contacted MD who placed order. Contacted dietary who stated they would send a STAT breakfast tray for patient.

## 2020-02-05 NOTE — TOC Progression Note (Signed)
Transition of Care [facility]) - Progression Note    Patient Details  Name: TAELAR GRONEWOLD MRN: [id number] Date of Birth: 28-Jun-1950  Transition of Care [facility]) CM/SW [place], [place] Phone Number: [phone] 02/05/2020, 4:35 PM  Clinical Narrative:     CSW reached out to [place] for [facility] services, waiting for reply.       Expected Discharge Plan and Services                                                 Social Determinants of Health (SDOH) Interventions    Readmission Risk Interventions Readmission Risk Prevention Plan 07/30/2018  Transportation Screening Complete  PCP or Specialist Appt within 3-5 Days Complete  HRI or [place] Complete  Social Work Consult for [place] Planning/Counseling Patient refused  Palliative Care Screening Patient Refused  Medication Review Press photographer) Complete  Some recent data might be hidden

## 2020-02-05 NOTE — ED Notes (Signed)
Report given to [facility].

## 2020-02-05 NOTE — Telephone Encounter (Signed)
Patient's husband called in wanted to know if Dr.Mclean could get her back in to [place] she is back in the hospital again and he can not lift her.

## 2020-02-05 NOTE — ED Notes (Signed)
Attempted to call report

## 2020-02-05 NOTE — ED Notes (Signed)
Head of patient's bed adjusted and lights dimmed. Pt attempting to sleep again and reports will press call bell for any needs. NAD at this time.

## 2020-02-05 NOTE — ED Notes (Signed)
Pt resting in bed upon assessment. Call bell remains in reach with lights dimmed. Pt denies needs at this time and NAD noted. Pt updated to the best of this RNs ability.

## 2020-02-05 NOTE — ED Notes (Signed)
Pt reporting she has not been able to sleep and has requested Xanax to sleep. Pt reports taking this medication at home but it is not listed in med rec. Pharm tech able to verify that pt was prescribed by PCP and has been filing medication appropriately.

## 2020-02-05 NOTE — ED Notes (Signed)
Patient requesting carafate. MD notified

## 2020-02-05 NOTE — ED Notes (Addendum)
Pt sleeping at this time.

## 2020-02-05 NOTE — Telephone Encounter (Signed)
The social worker at the hospital can help do this if she is still there    I.e get back to [facility]

## 2020-02-05 NOTE — ED Notes (Signed)
Report received from [facility]. Patient resting comfortably in bed.

## 2020-02-05 NOTE — ED Notes (Signed)
Pt cleaned of urine, peri care preformed. Linens changed

## 2020-02-05 NOTE — Evaluation (Addendum)
Physical Therapy Evaluation Patient Details Name: CHELSIA SERRES MRN: [id number] DOB: 1950/07/25 Today's Date: 02/05/2020   History of Present Illness  Genice Kimberlin is a 67yoF who comes to [facility] on 11/14 c Lt shoulder pain, bilat pedal pain, bladder spasm. Pt was recently at [place] [facility], has been home for 9 days, initially getting around well, AMB in house with RW. Per RN, pt has not been taking lyrica, which pt says is for her neuropathy, formerly seeing pain management. PMH includes chronic left achilles wound followed by podiatry, chronic Left diabetic heel ulcer followed by podiatry, PVD, chronic left shoulder DJD/pain; Rt shoulder OA s/p surgery.  Clinical Impression  Pt admitted with above diagnosis. Pt currently with functional limitations due to the deficits listed below (see "PT Problem List"). Upon entry, pt in bed, awake and agreeable to participate. Pt reports 8/10 left shoulder pain, 6-7/10 bilat pedal pain. The pt is alert and oriented x3, pleasant, conversational, and generally a fair historian but unable to provide some details when asked. MMT demonstrate heavy weakness of BLE, Left >Rt chornic due to remote CVA, hip/ext limited by pain in feet. LUE not tolerating much ROM except for partial elbow ROM. Bed mobility deferred due to severe pain limitations. Functional mobility assessment demonstrates increased effort/time requirements, poor tolerance, and need for physical assistance, whereas the patient performed these at a higher level of independence PTA. It seems most of pt's pain limitations, although chronic, are acutely exacerbated and more limiting that typical- would hope that return to baseline antalgia regimen would allow for return of baseline activity tolerance. Pt will benefit from skilled PT intervention to increase independence and safety with basic mobility in preparation for discharge to the venue listed below.       Follow Up Recommendations HHPT;  Supervision/Assistance - 24 hour;Supervision for mobility/OOB    Equipment Recommendations  None recommended by PT    Recommendations for Other Services       Precautions / Restrictions Precautions Precautions: Fall Restrictions Weight Bearing Restrictions: No      Mobility  Bed Mobility Overal bed mobility:  (deferred due to uncontrolle dpain, pt not tolerating movement at this time.)                  Transfers                    Ambulation/Gait                Stairs            Wheelchair Mobility    Modified Rankin (Stroke Patients Only)       Balance                                             Pertinent Vitals/Pain Pain Assessment: 0-10 Pain Location: 8/10Left shoulder; 6/10 bilat pedal neuropathic pain Pain Descriptors / Indicators: Aching;Burning Pain Intervention(s): Limited activity within patient's tolerance;Monitored during session;Repositioned    Home Living Family/patient expects to be discharged to:: Private residence Living Arrangements: Spouse/significant other Available Help at Discharge: Family;Available 24 hours/day Type of Home: House Home Access: Ramped entrance     Home Layout: One level Home Equipment: Walker - 2 wheels;Walker - 4 wheels;Bedside commode Additional Comments: Reports she has 4 steps in front, but has ramp in garage.    Prior Function Level of Independence:  Needs assistance   Gait / Transfers Assistance Needed: household AMB with RW since home from [place] / Homemaking Assistance Needed: [name] asssits with bra fassening.        Hand Dominance        Extremity/Trunk Assessment   Upper Extremity Assessment Upper Extremity Assessment: Generalized weakness;LUE deficits/detail (RUE WNL) LUE Deficits / Details: Painful, guarded, allows partial ROM of Left elbow, moderate Left grip weakness; very limited A/ROM or P/ROM of Left ER.    Lower Extremity  Assessment Lower Extremity Assessment: Generalized weakness (grossly 3/5 hips/knee/ankles; pain is prohibitive.)       Communication      Cognition Arousal/Alertness: Awake/alert Behavior During Therapy: WFL for tasks assessed/performed Overall Cognitive Status: Within Functional Limits for tasks assessed                                        General Comments      Exercises     Assessment/Plan    PT Assessment Patient needs continued PT services  PT Problem List Decreased strength;Decreased range of motion;Decreased activity tolerance;Decreased mobility;Decreased knowledge of use of DME;Decreased safety awareness;Decreased knowledge of precautions       PT Treatment Interventions DME instruction;Gait training;Stair training;Functional mobility training;Therapeutic activities;Therapeutic exercise;Neuromuscular re-education;Patient/family education;Cognitive remediation;Wheelchair mobility training    PT Goals (Current goals can be found in the Care Plan section)  Acute Rehab PT Goals Patient Stated Goal: improve pain in feet to tolerate transfers PT Goal Formulation: With patient Time For Goal Achievement: 02/19/20 Potential to Achieve Goals: Poor    Frequency Min 2X/week   Barriers to discharge        Co-evaluation               AM-PAC PT "6 Clicks" Mobility  Outcome Measure Help needed turning from your back to your side while in a flat bed without using bedrails?: Total Help needed moving from lying on your back to sitting on the side of a flat bed without using bedrails?: Total Help needed moving to and from a bed to a chair (including a wheelchair)?: Total Help needed standing up from a chair using your arms (e.g., wheelchair or bedside chair)?: Total Help needed to walk in hospital room?: Total Help needed climbing 3-5 steps with a railing? : Total 6 Click Score: 6    End of Session   Activity Tolerance: Patient limited by  pain Patient left: in bed;with call bell/phone within reach Nurse Communication: Mobility status PT Visit Diagnosis: Difficulty in walking, not elsewhere classified (R26.2);Muscle weakness (generalized) (M62.81)    Time: 7494-4967 PT Time Calculation (min) (ACUTE ONLY): 21 min   Charges:   PT Evaluation $PT Eval Moderate Complexity: 1 Mod          12:08 PM, 02/05/20 Etta Grandchild, PT, DPT Physical Therapist - [facility]  [phone] ([place])    Francisco Ostrovsky C 02/05/2020, 11:28 AM

## 2020-02-06 DIAGNOSIS — G894 Chronic pain syndrome: Secondary | ICD-10-CM | POA: Diagnosis not present

## 2020-02-06 DIAGNOSIS — E440 Moderate protein-calorie malnutrition: Secondary | ICD-10-CM | POA: Diagnosis present

## 2020-02-06 DIAGNOSIS — I639 Cerebral infarction, unspecified: Secondary | ICD-10-CM | POA: Diagnosis not present

## 2020-02-06 DIAGNOSIS — E039 Hypothyroidism, unspecified: Secondary | ICD-10-CM | POA: Diagnosis not present

## 2020-02-06 DIAGNOSIS — Z903 Acquired absence of stomach [part of]: Secondary | ICD-10-CM | POA: Diagnosis not present

## 2020-02-06 DIAGNOSIS — E1151 Type 2 diabetes mellitus with diabetic peripheral angiopathy without gangrene: Secondary | ICD-10-CM | POA: Diagnosis not present

## 2020-02-06 DIAGNOSIS — I10 Essential (primary) hypertension: Secondary | ICD-10-CM | POA: Diagnosis not present

## 2020-02-06 LAB — URINE CULTURE

## 2020-02-06 LAB — GLUCOSE, CAPILLARY
Glucose-Capillary: 83 mg/dL (ref 70–99)
Glucose-Capillary: 109 mg/dL — ABNORMAL HIGH (ref 70–99)
Glucose-Capillary: 193 mg/dL — ABNORMAL HIGH (ref 70–99)
Glucose-Capillary: 194 mg/dL — ABNORMAL HIGH (ref 70–99)
Glucose-Capillary: 173 mg/dL — ABNORMAL HIGH (ref 70–99)
Glucose-Capillary: 165 mg/dL — ABNORMAL HIGH (ref 70–99)

## 2020-02-06 LAB — HEMOGLOBIN A1C
Hgb A1c MFr Bld: 7.1 % — ABNORMAL HIGH (ref 4.8–5.6)
Mean Plasma Glucose: 157.07 mg/dL

## 2020-02-06 LAB — LIPID PANEL
Cholesterol: 139 mg/dL (ref 0–200)
HDL: 61 mg/dL (ref >40)
LDL Cholesterol: 68 mg/dL (ref 0–99)
Total CHOL/HDL Ratio: 2.3 RATIO
Triglycerides: 52 mg/dL (ref <150)
VLDL: 10 mg/dL (ref 0–40)

## 2020-02-06 LAB — TSH: TSH: 0.939 u[IU]/mL (ref 0.350–4.500)

## 2020-02-06 MED ORDER — VITAMIN B-12 1000 MCG PO TABS: 1000.0000 ug | ORAL_TABLET | Freq: Every day | ORAL | Status: DC

## 2020-02-06 MED ORDER — STROKE: EARLY STAGES OF RECOVERY BOOK: Freq: Once | Status: AC

## 2020-02-06 MED ORDER — ACETAMINOPHEN 325 MG PO TABS
650.0000 mg | ORAL_TABLET | ORAL | Status: DC | PRN
  Administered 2020-02-07: 650 mg via ORAL
  Filled 2020-02-06 (×2): qty 2

## 2020-02-06 MED ORDER — METOPROLOL SUCCINATE ER 50 MG PO TB24
50.0000 mg | ORAL_TABLET | Freq: Every day | ORAL | Status: DC
  Administered 2020-02-06 – 2020-02-10 (×5): 50 mg via ORAL
  Filled 2020-02-06 (×5): qty 1

## 2020-02-06 MED ORDER — CYANOCOBALAMIN 1000 MCG/ML IJ SOLN
1000.0000 ug | Freq: Every day | INTRAMUSCULAR | Status: DC
  Administered 2020-02-06 – 2020-02-10 (×5): 1000 ug via INTRAMUSCULAR
  Filled 2020-02-06 (×5): qty 1

## 2020-02-06 MED ORDER — ALBUTEROL SULFATE (2.5 MG/3ML) 0.083% IN NEBU: 2.5000 mg | INHALATION_SOLUTION | RESPIRATORY_TRACT | Status: DC | PRN

## 2020-02-06 MED ORDER — INFLUENZA VAC A&B SA ADJ QUAD 0.5 ML IM PRSY
0.5000 mL | PREFILLED_SYRINGE | INTRAMUSCULAR | Status: AC
  Administered 2020-02-07: 10:00:00 0.5 mL via INTRAMUSCULAR
  Filled 2020-02-06: qty 0.5

## 2020-02-06 MED ORDER — INSULIN ASPART 100 UNIT/ML ~~LOC~~ SOLN
0.0000 [IU] | Freq: Three times a day (TID) | SUBCUTANEOUS | Status: DC
  Administered 2020-02-06 – 2020-02-07 (×2): 2 [IU] via SUBCUTANEOUS
  Administered 2020-02-07 – 2020-02-08 (×2): 1 [IU] via SUBCUTANEOUS
  Administered 2020-02-08: 12:00:00 2 [IU] via SUBCUTANEOUS
  Administered 2020-02-09 – 2020-02-10 (×4): 1 [IU] via SUBCUTANEOUS
  Administered 2020-02-10: 2 [IU] via SUBCUTANEOUS
  Filled 2020-02-06 (×8): qty 1

## 2020-02-06 MED ORDER — ROSUVASTATIN CALCIUM 20 MG PO TABS
20.0000 mg | ORAL_TABLET | Freq: Every day | ORAL | Status: DC
  Administered 2020-02-06 – 2020-02-10 (×5): 20 mg via ORAL
  Filled 2020-02-06 (×5): qty 1

## 2020-02-06 MED ORDER — INSULIN ASPART 100 UNIT/ML ~~LOC~~ SOLN: 0.0000 [IU] | Freq: Every day | SUBCUTANEOUS | Status: DC

## 2020-02-06 MED ORDER — OXYBUTYNIN CHLORIDE ER 10 MG PO TB24
10.0000 mg | ORAL_TABLET | Freq: Every day | ORAL | Status: DC
  Administered 2020-02-06 – 2020-02-09 (×4): 10 mg via ORAL
  Filled 2020-02-06 (×5): qty 1

## 2020-02-06 MED ORDER — ALPRAZOLAM 0.5 MG PO TABS
0.5000 mg | ORAL_TABLET | Freq: Every evening | ORAL | Status: DC | PRN
  Administered 2020-02-07 – 2020-02-09 (×3): 0.5 mg via ORAL
  Filled 2020-02-06 (×3): qty 1

## 2020-02-06 MED ORDER — HYDROCODONE-ACETAMINOPHEN 5-325 MG PO TABS
1.0000 | ORAL_TABLET | Freq: Three times a day (TID) | ORAL | Status: DC | PRN
  Administered 2020-02-06 – 2020-02-10 (×8): 1 via ORAL
  Filled 2020-02-06 (×8): qty 1

## 2020-02-06 MED ORDER — SENNOSIDES-DOCUSATE SODIUM 8.6-50 MG PO TABS: 1.0000 | ORAL_TABLET | Freq: Once | ORAL | Status: DC

## 2020-02-06 MED ORDER — ACETAMINOPHEN 160 MG/5ML PO SOLN
650.0000 mg | ORAL | Status: DC | PRN
  Filled 2020-02-06: qty 20.3

## 2020-02-06 MED ORDER — ALBUTEROL SULFATE HFA 108 (90 BASE) MCG/ACT IN AERS: 2.0000 | INHALATION_SPRAY | Freq: Four times a day (QID) | RESPIRATORY_TRACT | Status: DC | PRN

## 2020-02-06 MED ORDER — MONTELUKAST SODIUM 10 MG PO TABS
10.0000 mg | ORAL_TABLET | Freq: Every day | ORAL | Status: DC
  Administered 2020-02-06 – 2020-02-09 (×4): 10 mg via ORAL
  Filled 2020-02-06 (×4): qty 1

## 2020-02-06 MED ORDER — ACETAMINOPHEN 650 MG RE SUPP: 650.0000 mg | RECTAL | Status: DC | PRN

## 2020-02-06 MED ORDER — SODIUM CHLORIDE 0.9 % IV SOLN: INTRAVENOUS | Status: DC

## 2020-02-06 MED ORDER — ADULT MULTIVITAMIN W/MINERALS CH
1.0000 | ORAL_TABLET | Freq: Every day | ORAL | Status: DC
  Administered 2020-02-07 – 2020-02-10 (×4): 1 via ORAL
  Filled 2020-02-06 (×4): qty 1

## 2020-02-06 NOTE — Progress Notes (Signed)
SLP Cancellation Note  Patient Details Name: [name] MRN: [id number] DOB: February 20, 1951   Cancelled treatment:       Reason Eval/Treat Not Completed: SLP screened, no needs identified, will sign off (no acute cognitive linguistic changes)  Casy Tavano B. Rutherford Nail M.S., CCC-SLP, [place] Office [phone]  Stormy Fabian 02/06/2020, 1:02 PM

## 2020-02-06 NOTE — Progress Notes (Signed)
OT Cancellation Note  Patient Details Name: [name] MRN: [id number] DOB: 13-Dec-1950   Cancelled Treatment:    Reason Eval/Treat Not Completed: Pain limiting ability to participate. Upon arrival pt reports 10/10 pain in B feet - RN notified. Defers session this AM. Will re-attempt at later date/time as able.   Dessie Coma, M.S. OTR/L  02/06/20, 10:37 AM  ascom [phone]

## 2020-02-06 NOTE — Progress Notes (Signed)
Physical Therapy Treatment Patient Details Name: [name] MRN: [id number] DOB: 1951-03-07 Today's Date: 02/06/2020    History of Present Illness [name] is a 18yoF who comes to [facility] on 11/14 c Lt shoulder pain, bilat pedal pain, bladder spasm. Pt was recently at [name], has been home for 9 days, initially getting around well, AMB in house with RW. Per RN, pt has not been taking lyrica, which pt says is for her neuropathy, formerly seeing pain management. PMH includes chronic left achilles wound followed by podiatry, chronic Left diabetic heel ulcer followed by podiatry, PVD, chronic left shoulder DJD/pain; Rt shoulder OA s/p surgery. Pt went to MRI late 11/15 found to have acute frontal lobe infarcts.    PT Comments    Pt in bed upon entry, agreeable to participate, reports to feel marginally better, but objectively does tolerate more exercises and mobility this date. LUE pain remain severe enough to restrict use. Bilat pedal neuropathic pain precludes any motivation to attempt SPT or standing. Pt takes part in exercises in bed with good tolerance. Inspected chronic wounds, noted eschar on left achilles. Pt reports she has not seen podiatry for these wounds in several weeks due to other medical issues. Sounds as though pt has began to self select use of cam rocker, unclear what podiatry is most recently recommending for foot wear for weightbearing. WIll reach out to hospitalist for FU.           Follow Up Recommendations  Supervision/Assistance - 24 hour;Supervision for mobility/OOB;Home health PT     Equipment Recommendations  None recommended by PT    Recommendations for Other Services       Precautions / Restrictions Precautions Precautions: Fall    Mobility  Bed Mobility Overal bed mobility: Needs Assistance Bed Mobility: Supine to Sit     Supine to sit: Mod assist     General bed mobility comments: unable to use LUE very much  Transfers                   General transfer comment: pt refused, pain too intense in feet  Ambulation/Gait                 Stairs             Wheelchair Mobility    Modified Rankin (Stroke Patients Only)       Balance                                            Cognition Arousal/Alertness: Awake/alert Behavior During Therapy: WFL for tasks assessed/performed Overall Cognitive Status: Within Functional Limits for tasks assessed                                        Exercises General Exercises - Lower Extremity Ankle Circles/Pumps: AROM;Both;Seated;15 reps Short Arc Quad: AROM;Both;15 reps;Supine Heel Slides: AAROM;Both;15 reps;Supine Hip ABduction/ADduction: AROM;Both;15 reps;Supine Straight Leg Raises: AAROM;Both;15 reps;Supine Other Exercises Other Exercises: resisted hip/knee extension in supine: 1x10 bilat (min-mod resistance tolerated)    General Comments        Pertinent Vitals/Pain Pain Assessment: 0-10 Pain Score: 6  Pain Location: B feet c WBing Pain Intervention(s): Limited activity within patient's tolerance;Monitored during session;Premedicated before session    Home Living  Prior Function            PT Goals (current goals can now be found in the care plan section) Acute Rehab PT Goals Patient Stated Goal: improve pain in feet to tolerate transfers PT Goal Formulation: With patient Time For Goal Achievement: 02/19/20 Potential to Achieve Goals: Poor Progress towards PT goals: Progressing toward goals    Frequency    Min 2X/week      PT Plan      Co-evaluation              AM-PAC PT "6 Clicks" Mobility   Outcome Measure  Help needed turning from your back to your side while in a flat bed without using bedrails?: Total Help needed moving from lying on your back to sitting on the side of a flat bed without using bedrails?: Total Help needed moving to and  from a bed to a chair (including a wheelchair)?: Total Help needed standing up from a chair using your arms (e.g., wheelchair or bedside chair)?: Total Help needed to walk in hospital room?: Total Help needed climbing 3-5 steps with a railing? : Total 6 Click Score: 6    End of Session   Activity Tolerance: Patient limited by pain;Patient tolerated treatment well Patient left: in bed;with call bell/phone within reach;with nursing/sitter in room Nurse Communication: Mobility status PT Visit Diagnosis: Difficulty in walking, not elsewhere classified (R26.2);Muscle weakness (generalized) (M62.81)     Time: 5456-2563 PT Time Calculation (min) (ACUTE ONLY): 32 min  Charges:  $Therapeutic Exercise: 23-37 mins                     1:05 PM, 02/06/20 [name], PT, DPT Physical Therapist - [facility]  [phone] ([place])     [name] C 02/06/2020, 12:54 PM

## 2020-02-06 NOTE — Evaluation (Signed)
Clinical/Bedside Swallow Evaluation Patient Details  Name: [name] MRN: [id number] Date of Birth: 1950/08/11  Today's Date: 02/06/2020 Time: SLP Start Time (ACUTE ONLY): 0820 SLP Stop Time (ACUTE ONLY): 0855 SLP Time Calculation (min) (ACUTE ONLY): 35 min  Past Medical History:  Past Medical History:  Diagnosis Date  . Abnormal antibody titer   . Anxiety and depression   . Arthritis   . Asthma   . Cystocele   . Depression   . Diabetes ([place])   . DVT (deep venous thrombosis) (HCC)    Righ calf  . Dyspnea    11/08/2018 - "more now due to lack of exercise"  . Dysrhythmia    afib  . Edema   . GERD (gastroesophageal reflux disease)   . Heart murmur    Echocardiogram June 2015: Mild MR (possible vegetation seen on TTE, not seen on TEE), normal LV size with moderate concentric LVH. Normal function EF 60-65%. Normal diastolic function. Mild LA dilation.  Marland Kitchen History of IBS   . History of kidney stones   . History of methicillin resistant staphylococcus aureus (MRSA) 2008  . Hyperlipidemia   . Hypertension   . Hypothyroidism   . Insomnia   . Kidney stone    kidney stones with lithotripsy .  [facility]- "adrenal glands"  . Neuropathy involving both lower extremities   . Obesity, Class II, BMI 35-39.9, with comorbidity   . Paroxysmal atrial fibrillation ([place]) 2007   In June 2015, Cardiac Event Monitor: Mostly SR/sinus arrhythmia with PVCs that are frequent. Short bursts of A. fib lasting several minutes;; CHA2DS2-VASc Score = 5 (age, Female, PVD, DM, HTN)  . Peripheral vascular disease ([place])   . Sleep apnea    use C-PAP  . Urinary incontinence   . Venous stasis dermatitis of both lower extremities    Past Surgical History:  Past Surgical History:  Procedure Laterality Date  . ANKLE SURGERY Right   . APPLICATION OF WOUND VAC Left 06/27/2015   Procedure: APPLICATION OF WOUND VAC ( POSSIBLE ) ;  Surgeon: [name], MD;  Location: [facility] ORS;  Service: Vascular;   Laterality: Left;  . APPLICATION OF WOUND VAC Left 09/25/2016   Procedure: APPLICATION OF WOUND VAC;  Surgeon: [name], DPM;  Location: [facility] ORS;  Service: Podiatry;  Laterality: Left;  . arm surgery     right    . CHOLECYSTECTOMY    . COLONOSCOPY    . COLONOSCOPY WITH PROPOFOL N/A 01/11/2017   Procedure: COLONOSCOPY WITH PROPOFOL;  Surgeon: [name], MD;  Location: [facility] ENDOSCOPY;  Service: Endoscopy;  Laterality: N/A;  . COLONOSCOPY WITH PROPOFOL N/A 02/22/2017   Procedure: COLONOSCOPY WITH PROPOFOL;  Surgeon: [name], MD;  Location: [facility] ENDOSCOPY;  Service: Endoscopy;  Laterality: N/A;  . COLONOSCOPY WITH PROPOFOL N/A 05/31/2017   Procedure: COLONOSCOPY WITH PROPOFOL;  Surgeon: [name], MD;  Location: [facility] ENDOSCOPY;  Service: Endoscopy;  Laterality: N/A;  . ESOPHAGOGASTRODUODENOSCOPY (EGD) WITH PROPOFOL N/A 01/11/2017   Procedure: ESOPHAGOGASTRODUODENOSCOPY (EGD) WITH PROPOFOL;  Surgeon: [name], MD;  Location: [facility] ENDOSCOPY;  Service: Endoscopy;  Laterality: N/A;  . ESOPHAGOGASTRODUODENOSCOPY (EGD) WITH PROPOFOL N/A 10/25/2018   Procedure: ESOPHAGOGASTRODUODENOSCOPY (EGD) WITH PROPOFOL;  Surgeon: [name], MD;  Location: [facility] ENDOSCOPY;  Service: Endoscopy;  Laterality: N/A;  . ESOPHAGOGASTRODUODENOSCOPY (EGD) WITH PROPOFOL N/A 11/29/2019   Procedure: ESOPHAGOGASTRODUODENOSCOPY (EGD) WITH PROPOFOL;  Surgeon: Toledo, Benay Pike, MD;  Location: [facility] ENDOSCOPY;  Service: Gastroenterology;  Laterality: N/A;  .  HERNIA REPAIR     umbilical  . HIATAL HERNIA REPAIR    . I & D EXTREMITY Left 06/27/2015   Procedure: IRRIGATION AND DEBRIDEMENT EXTREMITY            ( CALF HEMATOMA ) POSSIBLE WOUND VAC;  Surgeon: [name], MD;  Location: [facility] ORS;  Service: Vascular;  Laterality: Left;  . INCISION AND DRAINAGE OF WOUND Left 09/25/2016   Procedure: IRRIGATION AND DEBRIDEMENT - PARTIAL RESECTION OF ACHILLES TENDON WITH WOUND VAC APPLICATION;   Surgeon: [name], DPM;  Location: [facility] ORS;  Service: Podiatry;  Laterality: Left;  . INCISION AND DRAINAGE OF WOUND Left 11/09/2018   Procedure: Excision of left foot wound with ACell placement;  Surgeon: Wallace Going, DO;  Location: [place];  Service: Plastics;  Laterality: Left;  . IRRIGATION AND DEBRIDEMENT ABSCESS Left 09/07/2016   Procedure: IRRIGATION AND DEBRIDEMENT ABSCESS LEFT HEEL;  Surgeon: [name], DPM;  Location: [facility] ORS;  Service: Podiatry;  Laterality: Left;  . JOINT REPLACEMENT  2013   left knee replacement  . KIDNEY SURGERY Right    kidney stones  . LITHOTRIPSY    . LOWER EXTREMITY ANGIOGRAPHY Left 04/04/2018   Procedure: LOWER EXTREMITY ANGIOGRAPHY;  Surgeon: [name], MD;  Location: [place] [facility];  Service: Cardiovascular;  Laterality: Left;  . NM GATED MYOVIEW ([place] [facility])  February 2017   Likely breast attenuation. LOW RISK study. Normal EF 55-60%.  . OTHER SURGICAL HISTORY     08/2016 or 09/2016 surgery on achilles tenso h/o staph infection heal. Dr. [name]  . removal of left hematoma Left    leg  . REVERSE SHOULDER ARTHROPLASTY Right 10/08/2015   Procedure: REVERSE SHOULDER ARTHROPLASTY;  Surgeon: [name], MD;  Location: [facility] ORS;  Service: Orthopedics;  Laterality: Right;  . SLEEVE GASTROPLASTY    . TONSILLECTOMY    . TOTAL KNEE ARTHROPLASTY Left   . TOTAL SHOULDER REPLACEMENT Right   . TRANSESOPHAGEAL ECHOCARDIOGRAM  08/08/2013   Mild LVH, EF 60-65%. Moderate LA dilation and mild RA dilation. Mild MR with no evidence of stenosis and no evidence of endocarditis. A false chordae is noted.  . TRANSTHORACIC ECHOCARDIOGRAM  08/03/2013   Mild-Moderate concentric LVH, EF 60-65%. Normal diastolic function. Mild LA dilation. Mild MR with possible vegetation  - not confirmed on TEE   . ULNAR NERVE TRANSPOSITION Right 08/06/2016   Procedure: ULNAR NERVE DECOMPRESSION/TRANSPOSITION;  Surgeon: [name], MD;  Location: [facility] ORS;  Service:  Orthopedics;  Laterality: Right;  . UPPER GI ENDOSCOPY    . VAGINAL HYSTERECTOMY     HPI:  [name] is a 69 y.o. adult with medical history significant of recent admission for acute embolic stroke, paroxysmal atrial fibrillation on Eliquis, DM2, morbid obesity, chronic pain syndrome who presented to the ED for increased confusion and falls at home. MRI revealed 3 mm acute cortical infarct within the posterior left frontal lobe, 2 mm acute infarct within the left frontal lobe periventricular white matter, Chronic small-vessel infarct within the left centrum semiovale, new as compared to MRI of 12/22/2019 and cerebral atrophy and severe chronic small vessel ischemic disease with multiple chronic infarcts, as detailed and otherwise unchanged.   Assessment / Plan / Recommendation Clinical Impression  Pt demonstrated adequate oropharyngeal abilities when consuming regular diet textures, puree and thin liquids via straw. As such she is at reduced risk of aspiration and can return to regular diet with thin liquids and medicine whole with  thin liquids. Education provided to pt's nurse and MD. ST intervention is not indicated at this time.  SLP Visit Diagnosis: Dysphagia, unspecified (R13.10)    Aspiration Risk  No limitations    Diet Recommendation Regular;Thin liquid   Liquid Administration via: Straw Medication Administration: Whole meds with liquid Supervision: Patient able to self feed Compensations: Minimize environmental distractions;Slow rate;Small sips/bites Postural Changes: Seated upright at 90 degrees    Other  Recommendations Oral Care Recommendations: Oral care BID   Follow up Recommendations None      Frequency and Duration   N/A         Prognosis   N/A     Swallow Study   General Date of Onset: 02/04/20 HPI: [name] is a 69 y.o. adult with medical history significant of recent admission for acute embolic stroke, paroxysmal atrial fibrillation on Eliquis,  DM2, morbid obesity, chronic pain syndrome who presented to the ED for increased confusion and falls at home. MRI revealed 3 mm acute cortical infarct within the posterior left frontal lobe, 2 mm acute infarct within the left frontal lobe periventricular white matter, Chronic small-vessel infarct within the left centrum semiovale, new as compared to MRI of 12/22/2019 and cerebral atrophy and severe chronic small vessel ischemic disease with multiple chronic infarcts, as detailed and otherwise unchanged. Type of Study: Bedside Swallow Evaluation Previous Swallow Assessment: previous admission - recommended regular diet with thin liquids Diet Prior to this Study: NPO Temperature Spikes Noted: No Respiratory Status: Room air History of Recent Intubation: No Behavior/Cognition: Alert;Cooperative;Pleasant mood Oral Cavity Assessment: Within Functional Limits Oral Care Completed by SLP: Recent completion by staff Oral Cavity - Dentition: Adequate natural dentition Vision: Functional for self-feeding Self-Feeding Abilities: Able to feed self Patient Positioning: Upright in bed Baseline Vocal Quality: Normal Volitional Cough: Strong Volitional Swallow: Able to elicit    Oral/Motor/Sensory Function Overall Oral Motor/Sensory Function: Within functional limits   Ice Chips Ice chips: Not tested   Thin Liquid Thin Liquid: Within functional limits Presentation: Self Fed;Straw    Nectar Thick Nectar Thick Liquid: Not tested   Honey Thick Honey Thick Liquid: Not tested   Puree Puree: Within functional limits Presentation: Self Fed;Spoon   Solid     Solid: Within functional limits Presentation: Self Fed     [name] B. Rutherford Nail M.S., CCC-SLP, [place] Pathologist Rehabilitation Services Office [phone]  Parnell Spieler 02/06/2020,1:00 PM

## 2020-02-06 NOTE — Progress Notes (Addendum)
PROGRESS NOTE  [name]:[id number] DOB: 02/17/1951 DOA: 02/04/2020 PCP: [name], [name], MD  HPI/Recap of past 24 hours: HPI from Dr Cherrie Distance is a 69 y.o. adult with medical history significant of recent admission for acute stroke, paroxysmal atrial fibrillation on Eliquis, DM2, morbid obesity, chronic pain syndrome, previous DVT, sleep apnea on CPAP, presented with increased confusion, worsening left-sided weakness, falls from home on 02/04/2020. Patient was also endorsing increased pain throughout in her left shoulder and feet. Reported bladder spasms, bladder incontinence, family initially was suspected UTI, but later reported that when patient had similar presentation, she was found to have a CVA. Last admit was a month ago for stroke of which her Pradaxa was switched to Eliquis. Pt has ASA allergy.  Patient was discharged to SNF and eventually discharged home.  In the ED, MRI showed new CVA, patient also managed for possible UTI.  Patient admitted for further management.    Today, patient still noted with left-sided weakness, worse in the upper extremity with tender to touch left upper arm/shoulder.  Patient denies any chest pain, shortness of breath, dizziness, abdominal pain, nausea/vomiting, fever/chills.  Reports bilateral feet pain for which she takes Norco.    Assessment/Plan: Active Problems:   Paroxysmal atrial fibrillation ([place]); CHA2DS2-Vasc Score 5. On Pradaxa   Essential hypertension   Sleep apnea   DM (diabetes mellitus) type II, controlled, with peripheral vascular disorder (HCC)   Hypothyroidism, unspecified   Gastro-esophageal reflux disease without esophagitis   Morbid obesity with BMI of 40.0-44.9, adult (HCC)   Chronic pain syndrome   Recurrent falls   Acute embolic stroke (HCC)   Hypertension associated with diabetes ([place])   Stroke ([place])   Acute cortical infarct within the posterior left frontal lobe MRI showed above Of  note, patient reports compliance to recently changed to Eliquis Last echo done on 6/21 showed EF of 60 to 65%, no regional wall motion abnormality, no atrial level shunt detected by color-flow Doppler LDL 68, A1c 7.1 Neurology consulted Continue Eliquis, allergic to aspirin (last took it around age 2 which she developed hives) PT/OT Neurochecks, telemetry  Possible UTI UA showed large leukocytes, few bacteria, greater than 50 WBC UC with multiple species Received a dose of fosfomycin 3g on 02/05/2020  Hypertension BP stable, allow for permissive hypertension Held home amlodipine, Hyzaar, restart pending BP  Paroxysmal A. Fib Currently rate controlled Continue metoprolol, Eliquis  Diabetes mellitus type 2 Last A1c 7.1 SSI, Accu-Cheks, hypoglycemic protocol Hold home regimen  Hypothyroidism Continue Synthroid  Chronic left diabetic heel ulcer/left Achilles wound Followed by podiatry, has not seen them in several weeks Wound consult while in-house Consulted podiatry Dr. Luana Shu  Obstructive sleep apnea Continue CPAP  Chronic pain syndrome Continue home pain regimen  Obesity Lifestyle modification advised       Malnutrition Type:      Malnutrition Characteristics:      Nutrition Interventions:       Estimated body mass index is 39.99 kg/m as calculated from the following:   Height as of this encounter: 5\' 5"  (1.651 m).   Weight as of this encounter: 109 kg.     Code Status: Full  Family Communication: None at bedside  Disposition Plan: Status is: Inpatient  Remains inpatient appropriate because:Inpatient level of care appropriate due to severity of illness   Dispo: The patient is from: Home              Anticipated d/c is to: Home  Anticipated d/c date is: 2 days              Patient currently is not medically stable to d/c.     Consultants:  Neurology  Procedures:  None  Antimicrobials: Received one dose of  fosfomycin  DVT prophylaxis: Eliquis  Objective: Vitals:   02/06/20 0445 02/06/20 0635 02/06/20 0741 02/06/20 1124  BP: 138/64 (!) 115/52 (!) 149/61 (!) 110/54  Pulse: 64 67 63 74  Resp: 16 17 17 17   Temp: 97.6 F (36.4 C) 97.6 F (36.4 C) 98 F (36.7 C) 98 F (36.7 C)  TempSrc:  Oral Oral Oral  SpO2: 97%  100% 99%  Weight:      Height:        Intake/Output Summary (Last 24 hours) at 02/06/2020 1125 Last data filed at 02/06/2020 1100 Gross per 24 hour  Intake 0 ml  Output 900 ml  Net -900 ml   Filed Weights   02/04/20 1428  Weight: 109 kg    Exam:  General: NAD   Cardiovascular: S1, S2 present  Respiratory:  Diminished breath sounds bilaterally  Abdomen: Soft, nontender, nondistended, bowel sounds present  Musculoskeletal: No bilateral pedal edema noted, noted left heel ulcer and Achilles tendon ulcer on the left  Skin:  As noted above   Psychiatry: Normal mood  Neurology: Unable to raise left arm likely due to shoulder pain, no other obvious focal neurologic deficits noted    Data Reviewed: CBC: Recent Labs  Lab 02/04/20 1645  WBC 7.0  NEUTROABS 5.3  HGB 10.4*  HCT 33.2*  MCV 82.2  PLT 950   Basic Metabolic Panel: Recent Labs  Lab 02/04/20 1645  NA 140  K 4.4  CL 102  CO2 30  GLUCOSE 143*  BUN 16  CREATININE 1.01*  CALCIUM 8.9   GFR: Estimated Creatinine Clearance (by C-G formula based on SCr of 1.01 mg/dL (H)) Female: 63.7 mL/min (A) Female: 77.5 mL/min (A) Liver Function Tests: No results for input(s): AST, ALT, ALKPHOS, BILITOT, PROT, ALBUMIN in the last 168 hours. No results for input(s): LIPASE, AMYLASE in the last 168 hours. No results for input(s): AMMONIA in the last 168 hours. Coagulation Profile: No results for input(s): INR, PROTIME in the last 168 hours. Cardiac Enzymes: No results for input(s): CKTOTAL, CKMB, CKMBINDEX, TROPONINI in the last 168 hours. BNP (last 3 results) No results for input(s): PROBNP in the  last 8760 hours. HbA1C: Recent Labs    02/06/20 0453  HGBA1C 7.1*   CBG: Recent Labs  Lab 02/05/20 2209 02/05/20 2350 02/06/20 0441 02/06/20 0745 02/06/20 1122  GLUCAP 222* 193* 83 109* 194*   Lipid Profile: Recent Labs    02/06/20 0453  CHOL 139  HDL 61  LDLCALC 68  TRIG 52  CHOLHDL 2.3   Thyroid Function Tests: No results for input(s): TSH, T4TOTAL, FREET4, T3FREE, THYROIDAB in the last 72 hours. Anemia Panel: No results for input(s): VITAMINB12, FOLATE, FERRITIN, TIBC, IRON, RETICCTPCT in the last 72 hours. Urine analysis:    Component Value Date/Time   COLORURINE YELLOW (A) 02/04/2020 1537   APPEARANCEUR CLOUDY (A) 02/04/2020 1537   APPEARANCEUR Clear 09/18/2019 1437   LABSPEC 1.011 02/04/2020 1537   PHURINE 7.0 02/04/2020 1537   GLUCOSEU NEGATIVE 02/04/2020 1537   GLUCOSEU NEGATIVE 08/02/2017 1427   HGBUR SMALL (A) 02/04/2020 1537   BILIRUBINUR NEGATIVE 02/04/2020 1537   BILIRUBINUR Negative 09/18/2019 1437   BILIRUBINUR 1+ 09/15/2017 1319   KETONESUR NEGATIVE 02/04/2020 1537  PROTEINUR NEGATIVE 02/04/2020 1537   UROBILINOGEN 0.2 09/15/2017 1319   UROBILINOGEN 0.2 08/02/2017 1427   NITRITE NEGATIVE 02/04/2020 1537   LEUKOCYTESUR LARGE (A) 02/04/2020 1537   Sepsis Labs: @LABRCNTIP (procalcitonin:4,lacticidven:4)  ) Recent Results (from the past 240 hour(s))  Urine culture     Status: Abnormal   Collection Time: 02/05/20  3:41 AM   Specimen: Urine, Random  Result Value Ref Range Status   Specimen Description   Final    URINE, RANDOM Performed at [facility], [address] [place], [place] [address]    Special Requests   Final    NONE Performed at [facility], [address] [place], [place] [address]    Culture MULTIPLE SPECIES PRESENT, SUGGEST RECOLLECTION (A)  Final   Report Status 02/06/2020 FINAL  Final  Respiratory Panel by RT PCR (Flu A&B, Covid) - Nasopharyngeal Swab     Status: None   Collection Time: 02/05/20   3:41 AM   Specimen: Nasopharyngeal Swab  Result Value Ref Range Status   SARS Coronavirus 2 by RT PCR NEGATIVE NEGATIVE Final    Comment: (NOTE) SARS-CoV-2 target nucleic acids are NOT DETECTED.  The SARS-CoV-2 RNA is generally detectable in upper respiratoy specimens during the acute phase of infection. The lowest concentration of SARS-CoV-2 viral copies this assay can detect is 131 copies/mL. A negative result does not preclude SARS-Cov-2 infection and should not be used as the sole basis for treatment or other patient management decisions. A negative result may occur with  improper specimen collection/handling, submission of specimen other than nasopharyngeal swab, presence of viral mutation(s) within the areas targeted by this assay, and inadequate number of viral copies (<131 copies/mL). A negative result must be combined with clinical observations, patient history, and epidemiological information. The expected result is Negative.  Fact Sheet for Patients:  PinkCheek.be  Fact Sheet for Healthcare Providers:  GravelBags.it  This test is no t yet approved or cleared by the [place] FDA and  has been authorized for detection and/or diagnosis of SARS-CoV-2 by FDA under an Emergency Use Authorization (EUA). This EUA will remain  in effect (meaning this test can be used) for the duration of the COVID-19 declaration under Section 564(b)(1) of the Act, 21 U.S.C. section 360bbb-3(b)(1), unless the authorization is terminated or revoked sooner.     Influenza A by PCR NEGATIVE NEGATIVE Final   Influenza B by PCR NEGATIVE NEGATIVE Final    Comment: (NOTE) The Xpert Xpress SARS-CoV-2/FLU/RSV assay is intended as an aid in  the diagnosis of influenza from Nasopharyngeal swab specimens and  should not be used as a sole basis for treatment. Nasal washings and  aspirates are unacceptable for Xpert Xpress SARS-CoV-2/FLU/RSV    testing.  Fact Sheet for Patients: PinkCheek.be  Fact Sheet for Healthcare Providers: GravelBags.it  This test is not yet approved or cleared by the [place] FDA and  has been authorized for detection and/or diagnosis of SARS-CoV-2 by  FDA under an Emergency Use Authorization (EUA). This EUA will remain  in effect (meaning this test can be used) for the duration of the  Covid-19 declaration under Section 564(b)(1) of the Act, 21  U.S.C. section 360bbb-3(b)(1), unless the authorization is  terminated or revoked. Performed at [facility], [address], [place], [place] [address]       Studies: MR BRAIN WO CONTRAST  Result Date: 02/05/2020 CLINICAL DATA:  Neuro deficit, acute, stroke suspected. EXAM: MRI HEAD WITHOUT CONTRAST TECHNIQUE: Multiplanar, multiecho pulse  sequences of the brain and surrounding structures were obtained without intravenous contrast. COMPARISON:  Noncontrast head CT 02/04/2020. MRI/MRA head 12/22/2019. FINDINGS: Brain: Mild-to-moderate generalized cerebral atrophy. New from the prior MRI of 12/22/2019, there is a 3 mm cortical focus of restricted diffusion within the posterior left frontal lobe consistent with acute infarction (series 9, image 34). Also new from this prior exam, there is a 2 mm acute infarct within the left frontal lobe periventricular white matter (series 9, image 32). Unchanged small scattered chronic cortical infarcts, most notably within the right parietal lobe (series 15, image 35). Background severe scattered and confluent T2/FLAIR hyperintensity within the cerebral white matter which is nonspecific, but compatible with chronic small vessel ischemic disease. This includes multiple chronic cerebral white matter small vessel infarcts. A chronic small-vessel infarct within the left centrum semiovale is new as compared to the prior MRI (series 9, image 33). Prominent  perivascular spaces within the bilateral basal ganglia with likely superimposed chronic lacunar infarcts, unchanged. Redemonstrated chronic lacunar infarcts within the bilateral thalami. Comparatively mild chronic small vessel ischemic changes within the pons. There are few nonspecific scattered supratentorial chronic microhemorrhages, unchanged. No evidence of intracranial mass. No extra-axial fluid collection. No midline shift. Vascular: Expected proximal arterial flow voids. Skull and upper cervical spine: No focal marrow lesion. Sinuses/Orbits: Visualized orbits show no acute finding. Minimal left maxillary sinus mucosal thickening at the imaged levels. Trace left mastoid effusion. IMPRESSION: 3 mm acute cortical infarct within the posterior left frontal lobe. 2 mm acute infarct within the left frontal lobe periventricular white matter. Chronic small-vessel infarct within the left centrum semiovale, new as compared to MRI of 12/22/2019. Cerebral atrophy and severe chronic small vessel ischemic disease with multiple chronic infarcts, as detailed and otherwise unchanged. Electronically Signed   By: Kellie Simmering DO   On: 02/05/2020 18:49   DG Chest Port 1 View  Result Date: 02/05/2020 CLINICAL DATA:  Left-sided arm weakness and bilateral lower extremity weakness since Friday EXAM: PORTABLE CHEST 1 VIEW COMPARISON:  Radiograph 12/19/2019 FINDINGS: Chronic elevation of the right hemidiaphragm with some adjacent opacities, likely reflecting chronic scarring and/or atelectasis. There is some mild vascular congestion. Airways thickening is present as well, possibly cuffing versus bronchitis. No pneumothorax. No effusion. No focal consolidative opacity. Cardiomediastinal contours are unremarkable. Prior reverse right shoulder arthroplasty. Degenerative changes in the acromioclavicular joints and left glenohumeral joint as well. Multilevel degenerative changes are present in the imaged portions of the spine.  Remaining soft tissues are free of acute abnormality. Telemetry leads overlie the chest. IMPRESSION: 1. Mild vascular congestion. Airways thickening, possibly cuffing to suggest some early interstitial edema versus bronchitic change. 2. Chronic elevation of the right hemidiaphragm with adjacent chronic opacity, likely atelectasis and scarring. Electronically Signed   By: Lovena Le M.D.   On: 02/05/2020 21:54    Scheduled Meds: . apixaban  5 mg Oral BID  . buPROPion  150 mg Oral Daily  . colchicine  0.6 mg Oral BID  . DULoxetine  60 mg Oral Daily  . insulin aspart  0-9 Units Subcutaneous Q4H  . levothyroxine  175 mcg Oral Q0600  . methenamine  1,000 mg Oral BID  . mometasone-formoterol  2 puff Inhalation BID  . montelukast  10 mg Oral QHS  . oxybutynin  10 mg Oral QHS  . pantoprazole  40 mg Oral Daily  . pregabalin  50 mg Oral Daily  . rosuvastatin  20 mg Oral Daily  . senna-docusate  1 tablet Oral  Once  . sucralfate  1 g Oral TID WC & HS    Continuous Infusions: . sodium chloride 75 mL/hr at 02/06/20 0320     LOS: 1 day     Alma Friendly, MD [facility]  If 7PM-7AM, please contact night-coverage [email or web address] 02/06/2020, 11:25 AM

## 2020-02-06 NOTE — Telephone Encounter (Signed)
Spoke with the Patient's husband and Dr Olivia Mackie [name].   He states that [facility] said their is a 60 day gap of the Patient leaving and being able to come back in to the facility.   Dr Olivia Mackie [name] states she sent a message last night to the doctor over Patient's care in the hospital.   Called back and spoke with Patient's husband. He states the nurse has now informed him that the Patient will be admitted to a facility upon discharge. He wants to thank Dr Olivia Mackie [name] for everything she does.

## 2020-02-06 NOTE — NC FL2 (Signed)
[place] LEVEL OF CARE SCREENING TOOL     IDENTIFICATION  Patient Name: [name] Birthdate: 15-Jun-1950 Sex: adult Admission Date (Current Location): 02/04/2020  [facility] and [place] Number:  Engineering geologist and Address:  [facility], [address] [place], [place] [address]      Provider Number: [id number]  Attending Physician Name and Address:  Alma Friendly, MD  Relative Name and Phone Number:  Evelynn Hench (husband) [phone]    Current Level of Care: Hospital Recommended Level of Care: [place] Prior Approval Number:    Date Approved/Denied:   PASRR Number: [id number]  Discharge Plan: SNF    Current Diagnoses: Patient Active Problem List   Diagnosis Date Noted   Stroke [facility]) 02/05/2020   Hypertension associated with diabetes ([place]) 62/26/3335   Acute embolic stroke ([place]) 45/62/5638   Chronic bacteriuria 11/17/2019   Hyperkalemia 09/14/2019   Bacteremia due to Gram-positive bacteria 09/08/2019   Sepsis ([place]) 09/08/2019   Toxic encephalopathy 09/08/2019   Pressure injury of skin 09/04/2019   Elevated troponin 09/02/2019   COPD with acute exacerbation ([place]) 09/02/2019   Acute respiratory failure with hypoxia ([place]) 09/02/2019   Pharmacologic therapy 08/30/2019   Recurrent falls 07/10/2019   Left ankle pain 06/02/2019   Chronic pain of right ankle 02/14/2019   Arthritis 02/14/2019   Nausea and vomiting 02/14/2019   Meralgia paresthetica of right side 12/05/2018   Chronic anticoagulation ([place]) 12/05/2018   Peripheral vascular disease ([place]) 10/18/2018   Chronic ulcer of right leg ([place]) 10/18/2018   Fibromyalgia 08/17/2018   Chronic musculoskeletal pain 08/17/2018   Tremor of right hand 08/03/2018   Overactive bladder 08/03/2018   Physical deconditioning 08/03/2018   Foot ulcer, left ([place]) 03/09/2018   Adrenal mass ([place]) 08/02/2017   Eczema  06/27/2017   Diabetic ulcer of left foot ([place]) 06/24/2017   Mass of both adrenal glands ([place]) 05/16/2017   Fatty liver 05/13/2017   Strain of right hip 04/30/2017   Ischial pain, right 04/30/2017   Pain of left hip joint 04/22/2017   Abnormality of gait and mobility 04/22/2017   Muscle strain of left hip 04/09/2017   Anemia 11/18/2016   Asthma 11/18/2016   Carpal tunnel syndrome 11/18/2016   Pain in joint involving ankle and foot 11/18/2016   Obstructive sleep apnea of adult 11/18/2016   Spinal stenosis of thoracic region 11/18/2016   Abscess of tendon of left foot 09/21/2016   Cellulitis 09/05/2016   Cubital tunnel syndrome on right 08/07/2016   Chronic pain syndrome 02/25/2016   Long term current use of opiate analgesic 02/25/2016   Chronic right shoulder pain 02/25/2016   CAP (community acquired pneumonia) 10/11/2015   Status post reverse total shoulder replacement 10/08/2015   Neuropathy of right lateral femoral cutaneous nerve 09/23/2015   Morbid obesity with BMI of 40.0-44.9, adult ([place]) 09/23/2015   Chronic prescription benzodiazepine use 09/23/2015   Arthralgia 09/23/2015   Osteoarthritis involving multiple joints 09/23/2015   Other shoulder lesions (Right) 08/28/2015   Cervical radiculitis 08/06/2015   Fall 05/22/2015   Opiate use (15 MME/Day) 01/16/2015   Impingement syndrome of shoulder region 01/07/2015   Mixed incontinence 11/26/2014   Atrophic vaginitis 11/26/2014   Anxiety and depression 09/21/2014   Bladder cystocele 09/21/2014   Gastro-esophageal reflux disease without esophagitis 09/21/2014   DM (diabetes mellitus) type II, controlled, with peripheral vascular disorder ([place]) 08/31/2014   Diabetic peripheral neuropathy associated with type 2 diabetes mellitus ([place]) 08/31/2014  Asthma, well controlled 08/31/2014   Hypothyroidism, unspecified 08/31/2014   Peripheral vascular disease due to secondary diabetes mellitus  ([place]) 12/11/2013   Sleep apnea 10/19/2013   Paroxysmal atrial fibrillation ([place]); CHA2DS2-Vasc Score 5. On Pradaxa 07/27/2013   Essential hypertension 07/27/2013   Chronic atrial fibrillation 08/10/2012    Orientation RESPIRATION BLADDER Height & Weight     Self, Time, Situation, Place  Normal Continent Weight: 109 kg Height:  5\' 5"  (165.1 cm)  BEHAVIORAL SYMPTOMS/MOOD NEUROLOGICAL BOWEL NUTRITION STATUS      Continent Diet (Regular diet)  AMBULATORY STATUS COMMUNICATION OF NEEDS Skin   Extensive Assist Verbally Normal                       Personal Care Assistance Level of Assistance  Bathing, Feeding, Dressing Bathing Assistance: Maximum assistance Feeding assistance: Limited assistance Dressing Assistance: Maximum assistance     Functional Limitations Info             SPECIAL CARE FACTORS FREQUENCY  PT (By licensed PT), OT (By licensed OT)     PT Frequency: 5 times per week OT Frequency: 5 times per week            Contractures Contractures Info: Not present    Additional Factors Info  Code Status, Allergies Code Status Info: Full Allergies Info: Morphine, PCN, Ambien, asa, oxytetracycline           Current Medications (02/06/2020):  This is the current hospital active medication list Current Facility-Administered Medications  Medication Dose Route Frequency Provider Last Rate Last Admin   0.9 %  sodium chloride infusion   Intravenous Continuous Doutova, Anastassia, MD 75 mL/hr at 02/06/20 0320 New Bag at 02/06/20 0320   acetaminophen (TYLENOL) tablet 650 mg  650 mg Oral Q4H PRN Toy Baker, MD       Or   acetaminophen (TYLENOL) 160 MG/5ML solution 650 mg  650 mg Per Tube Q4H PRN Doutova, Anastassia, MD       Or   acetaminophen (TYLENOL) suppository 650 mg  650 mg Rectal Q4H PRN Doutova, Anastassia, MD       apixaban (ELIQUIS) tablet 5 mg  5 mg Oral BID Doutova, Anastassia, MD   5 mg at 02/06/20 0909   buPROPion (WELLBUTRIN XL)  24 hr tablet 150 mg  150 mg Oral Daily Doutova, Anastassia, MD   150 mg at 02/06/20 9528   colchicine tablet 0.6 mg  0.6 mg Oral BID Doutova, Anastassia, MD   0.6 mg at 02/05/20 1034   DULoxetine (CYMBALTA) DR capsule 60 mg  60 mg Oral Daily Doutova, Anastassia, MD   60 mg at 02/06/20 0908   insulin aspart (novoLOG) injection 0-9 Units  0-9 Units Subcutaneous Q4H Toy Baker, MD   2 Units at 02/06/20 0031   levothyroxine (SYNTHROID) tablet 175 mcg  175 mcg Oral Q0600 Toy Baker, MD   175 mcg at 02/06/20 0503   methenamine (MANDELAMINE) tablet 1,000 mg  1,000 mg Oral BID Toy Baker, MD   1,000 mg at 02/05/20 2202   mometasone-formoterol (DULERA) 200-5 MCG/ACT inhaler 2 puff  2 puff Inhalation BID Toy Baker, MD   2 puff at 02/06/20 0910   pantoprazole (PROTONIX) EC tablet 40 mg  40 mg Oral Daily Doutova, Anastassia, MD   40 mg at 02/06/20 0909   pregabalin (LYRICA) capsule 50 mg  50 mg Oral Daily Doutova, Anastassia, MD   50 mg at 02/06/20 0909   rosuvastatin (CRESTOR) tablet 20  mg  20 mg Oral Daily Alma Friendly, MD       senna-docusate (Senokot-S) tablet 1 tablet  1 tablet Oral Once Doutova, Nyoka Lint, MD       sucralfate (CARAFATE) 1 GM/10ML suspension 1 g  1 g Oral TID WC & HS Doutova, Anastassia, MD   1 g at 02/06/20 0908     Discharge Medications: Please see discharge summary for a list of discharge medications.  Relevant Imaging Results:  Relevant Lab Results:   Additional Information SS# [id number]  Shelbie Hutching, RN

## 2020-02-06 NOTE — TOC Progression Note (Signed)
Transition of Care [facility]) - Progression Note    Patient Details  Name: [name] MRN: [id number] Date of Birth: [birth year]  Transition of Care [facility]) CM/SW [place], [place] Phone Number:  [phone] 02/06/2020, 8:50 AM  Clinical Narrative:     CSW received IM from ED?RN on 02/05/2020 at 9:24PM, stating the patient requested her daughter [name] be removed from her contact list and no information be shared with her.  CSW shared this information with TOC RN/CM assigned to this patient.       Expected Discharge Plan and Services                                                 Social Determinants of Health (SDOH) Interventions    Readmission Risk Interventions Readmission Risk Prevention Plan 07/30/2018  Transportation Screening Complete  PCP or Specialist Appt within 3-5 Days Complete  HRI or [place] Complete  Social Work Consult for [place] Planning/Counseling Patient refused  Palliative Care Screening Patient Refused  Medication Review Press photographer) Complete  Some recent data might be hidden

## 2020-02-06 NOTE — Progress Notes (Signed)
Initial Nutrition Assessment  DOCUMENTATION CODES:   Non-severe (moderate) malnutrition in context of chronic illness  INTERVENTION:  Patient has declined oral nutrition supplements and snacks recommended by RD at this time.  Provide double protein portions with meals.  Provide MVI po daily.  NUTRITION DIAGNOSIS:   Moderate Malnutrition related to chronic illness (hx sleeve gastrectomy, inadequate oral intake) as evidenced by moderate fat depletion, moderate muscle depletion.  GOAL:   Patient will meet greater than or equal to 90% of their needs  MONITOR:   PO intake, Supplement acceptance, Labs, Weight trends, I & O's  REASON FOR ASSESSMENT:   Malnutrition Screening Tool    ASSESSMENT:   69 year old female with PMHx of hypothyroidism, DM, depression, paroxysmal A-fib, asthma, GERD, arthritis, PVD, sleep apnea, IBS, HTN, HLD, hx of laparoscopic sleeve gastrectomy on 11/23/2012 admitted with acute CVA, possible UTI, chronic left diabetic heel ulcer/left achilles wound.   Met with patient at bedside. She reports she has been attempting to eat 2-3 meals per day but is not able to finish her meals due to decreased appetite. She reports if she does not like the food offered her husband will get her a steak taco with lettuce and tomato. She also enjoys [place] yogurt. Patient reports she has had many oral nutrition supplements after her hx of sleeve gastrectomy and does not want to drink these anymore. Discussed importance of adequate calorie and protein intake. RD offered to order patient snacks but she declines at this time. She reports she will think about recommended interventions of ONS and snacks and let RD know if she changes her mind. Patient reports she no longer takes her bariatric vitamins/minerals. She reports she will look over her paperwork when she discharges and will consider resuming them.  Patient reports she initially lost 100 lbs after surgery and then remained  weight-stable. She reports she has recently lost approximately 20 lbs after a recent hospitalization. Per review of chart patient was 115.4 kg on 11/28/2019. She is now 109 kg (240.3 lbs). That is a weight loss of 6.4 kg (5.5% body weight) over 2 months, which is not significant for time frame.  Medications reviewed and include: Novolog 0-9 units TID, Novolog 0-5 units QHS, levothyroxine, Protonix, Carafate 1 gram TID and QHS.  Labs reviewed: CBG 109-194, Creatinine 1.01.  NUTRITION - FOCUSED PHYSICAL EXAM:    Most Recent Value  Orbital Region Moderate depletion  Upper Arm Region Moderate depletion  Thoracic and Lumbar Region No depletion  Buccal Region Moderate depletion  Temple Region Moderate depletion  Clavicle Bone Region Moderate depletion  Clavicle and Acromion Bone Region Mild depletion  Scapular Bone Region Unable to assess  Dorsal Hand Moderate depletion  Patellar Region No depletion  Anterior Thigh Region No depletion  Posterior Calf Region No depletion  Edema (RD Assessment) None  Hair Reviewed  Eyes Reviewed  Mouth Reviewed  Skin Reviewed  Nails Reviewed     Diet Order:   Diet Order            Diet heart healthy/carb modified Room service appropriate? Yes; Fluid consistency: Thin  Diet effective now                EDUCATION NEEDS:   Education needs have been addressed (Encouraged adequate intake of calories and protein)  Skin:  Skin Assessment: Skin Integrity Issues: Skin Integrity Issues:: Unstageable Unstageable: left foot (1cm x 1cm)  Last BM:  02/06/2020  Height:   Ht Readings from Last 1  Encounters:  02/04/20 _0  (1.651 m)   Weight:   Wt Readings from Last 1 Encounters:  02/04/20 109 kg   BMI:  Body mass index is 39.99 kg/m.  Estimated Nutritional Needs:   Kcal:  2100-2300  Protein:  105-115 grams  Fluid:  2 L/day  Jacklynn Barnacle, MS, RD, LDN Pager number available on Amion

## 2020-02-06 NOTE — Consult Note (Signed)
Neurology Consultation Reason for Consult: Strokes on MRI Requesting Physician: Lesia Sago  CC: Pain in the feet  History is obtained from: Patient and chart review   HPI: [name] is a 69 y.o. adult with a past medical history significant for paroxysmal atrial fibrillation on Eliquis, hypertension, hyperlipidemia, diabetes, anxiety/depression/chronic pain, hypothyroidism, obesity (BMI 40), obstructive sleep apnea on CPAP kidney stones, right ulnar nerve decompression, neuropathy on Lyrica, gastric sleeve off of vitamin supplementation.  She recently presented to [facility] in October with altered mental status (confusion) and left greater than right sided weakness.  At which time she was found to have multifocal strokes. Strokes were felt to be secondary to atrial fibrillation given multiple vascular territories involved.  Stroke labs are notable for LDL 44 and A1c of 6.9%.  Per report she had only missed a few doses of Pradaxa for endoscopy in November, and therefore she was switched to Eliquis.  She was discharged to rehab and then home for 9 days before re-presenting.  She is again presenting with increased confusion and falls, with new strokes found on MRI brain for which neurology is reconsulted.  At baseline she manages her own medications etc.  She reports that after a MRSA infection in her heel 2 to 2-1/2 years ago she stopped her post-gastric-sleeve vitamins as she felt it was just too much medication to manage on top of the antibiotics etc. that she needed.  Although she has since completed those antibiotics she notes she is currently on antibiotics for UTI.  However she never restarted her post gastric sleeve vitamins.  She has been having gradually increasing fall frequency at home.  She reports that she had a fall in August or September due to balance issues and felt that she has had some left arm weakness since her husband attempted to help her up by pulling on that arm.   Subsequently she had multifocal strokes in October, including a right lacunar stroke leading to further left-sided weakness.  However her major complaint is severe pain in her bilateral feet that prevents her from being able to walk due to the severity of her discomfort.  On review of systems she additionally notes feeling like it is harder to read as letters seem dimmer/smaller, which she will first noticed while in rehab last month.  She also is having some intermittent mild chest pressure 2-3 times a week without any clear trigger.  She has been having some chronic nausea for which she underwent endoscopy in September at which time biopsies of gastritis were taken as well dilation performed.  Sleeve gastrectomy was felt to be intact without issues.  She did not have any improvement in her symptoms.  She reports that her symptoms of nausea do worsen when she eats especially if she eats out or eats particularly greasy food.  She reports perhaps 2 episodes of recent emesis, food (nonbilious nonbloody).  She denies any melena or coffee-ground emesis.  Regarding her episodes of confusion, notably she is on multiple potentially sedating medications including hydrocodone/acetaminophen, pregabalin, oxybutynin, duloxetine, alprazolam.  However today she gives a very clear history and all details provided are readily corroborated in the electronic medical record.  LKW: Unclear, multiple days ago tPA given?: No, on AC and out of the window, as well as recent strokes in October Premorbid modified rankin scale:      3 - Moderate disability. Requires some help, but able to walk unassisted.  ROS: A 14 point ROS was performed and is  negative except as noted in the HPI.   Past Medical History:  Diagnosis Date  . Abnormal antibody titer   . Anxiety and depression   . Arthritis   . Asthma   . Cystocele   . Depression   . Diabetes ([place])   . DVT (deep venous thrombosis) (HCC)    Righ calf  . Dyspnea     11/08/2018 - "more now due to lack of exercise"  . Dysrhythmia    afib  . Edema   . GERD (gastroesophageal reflux disease)   . Heart murmur    Echocardiogram June 2015: Mild MR (possible vegetation seen on TTE, not seen on TEE), normal LV size with moderate concentric LVH. Normal function EF 60-65%. Normal diastolic function. Mild LA dilation.  Marland Kitchen History of IBS   . History of kidney stones   . History of methicillin resistant staphylococcus aureus (MRSA) 2008  . Hyperlipidemia   . Hypertension   . Hypothyroidism   . Insomnia   . Kidney stone    kidney stones with lithotripsy .  [facility]- "adrenal glands"  . Neuropathy involving both lower extremities   . Obesity, Class II, BMI 35-39.9, with comorbidity   . Paroxysmal atrial fibrillation ([place]) 2007   In June 2015, Cardiac Event Monitor: Mostly SR/sinus arrhythmia with PVCs that are frequent. Short bursts of A. fib lasting several minutes;; CHA2DS2-VASc Score = 5 (age, Female, PVD, DM, HTN)  . Peripheral vascular disease ([place])   . Sleep apnea    use C-PAP  . Urinary incontinence   . Venous stasis dermatitis of both lower extremities    Past Surgical History:  Procedure Laterality Date  . ANKLE SURGERY Right   . APPLICATION OF WOUND VAC Left 06/27/2015   Procedure: APPLICATION OF WOUND VAC ( POSSIBLE ) ;  Surgeon: [name], MD;  Location: [facility] ORS;  Service: Vascular;  Laterality: Left;  . APPLICATION OF WOUND VAC Left 09/25/2016   Procedure: APPLICATION OF WOUND VAC;  Surgeon: [name], DPM;  Location: [facility] ORS;  Service: Podiatry;  Laterality: Left;  . arm surgery     right    . CHOLECYSTECTOMY    . COLONOSCOPY    . COLONOSCOPY WITH PROPOFOL N/A 01/11/2017   Procedure: COLONOSCOPY WITH PROPOFOL;  Surgeon: [name], MD;  Location: [facility] ENDOSCOPY;  Service: Endoscopy;  Laterality: N/A;  . COLONOSCOPY WITH PROPOFOL N/A 02/22/2017   Procedure: COLONOSCOPY WITH PROPOFOL;  Surgeon: [name], MD;   Location: [facility] ENDOSCOPY;  Service: Endoscopy;  Laterality: N/A;  . COLONOSCOPY WITH PROPOFOL N/A 05/31/2017   Procedure: COLONOSCOPY WITH PROPOFOL;  Surgeon: [name], MD;  Location: [facility] ENDOSCOPY;  Service: Endoscopy;  Laterality: N/A;  . ESOPHAGOGASTRODUODENOSCOPY (EGD) WITH PROPOFOL N/A 01/11/2017   Procedure: ESOPHAGOGASTRODUODENOSCOPY (EGD) WITH PROPOFOL;  Surgeon: [name], MD;  Location: [facility] ENDOSCOPY;  Service: Endoscopy;  Laterality: N/A;  . ESOPHAGOGASTRODUODENOSCOPY (EGD) WITH PROPOFOL N/A 10/25/2018   Procedure: ESOPHAGOGASTRODUODENOSCOPY (EGD) WITH PROPOFOL;  Surgeon: [name], MD;  Location: [facility] ENDOSCOPY;  Service: Endoscopy;  Laterality: N/A;  . ESOPHAGOGASTRODUODENOSCOPY (EGD) WITH PROPOFOL N/A 11/29/2019   Procedure: ESOPHAGOGASTRODUODENOSCOPY (EGD) WITH PROPOFOL;  Surgeon: Toledo, Benay Pike, MD;  Location: [facility] ENDOSCOPY;  Service: Gastroenterology;  Laterality: N/A;  . HERNIA REPAIR     umbilical  . HIATAL HERNIA REPAIR    . I & D EXTREMITY Left 06/27/2015   Procedure: IRRIGATION AND DEBRIDEMENT EXTREMITY            (  CALF HEMATOMA ) POSSIBLE WOUND VAC;  Surgeon: [name], MD;  Location: [facility] ORS;  Service: Vascular;  Laterality: Left;  . INCISION AND DRAINAGE OF WOUND Left 09/25/2016   Procedure: IRRIGATION AND DEBRIDEMENT - PARTIAL RESECTION OF ACHILLES TENDON WITH WOUND VAC APPLICATION;  Surgeon: [name], DPM;  Location: [facility] ORS;  Service: Podiatry;  Laterality: Left;  . INCISION AND DRAINAGE OF WOUND Left 11/09/2018   Procedure: Excision of left foot wound with ACell placement;  Surgeon: Wallace Going, DO;  Location: [place];  Service: Plastics;  Laterality: Left;  . IRRIGATION AND DEBRIDEMENT ABSCESS Left 09/07/2016   Procedure: IRRIGATION AND DEBRIDEMENT ABSCESS LEFT HEEL;  Surgeon: [name], DPM;  Location: [facility] ORS;  Service: Podiatry;  Laterality: Left;  . JOINT REPLACEMENT  2013   left knee replacement  . KIDNEY  SURGERY Right    kidney stones  . LITHOTRIPSY    . LOWER EXTREMITY ANGIOGRAPHY Left 04/04/2018   Procedure: LOWER EXTREMITY ANGIOGRAPHY;  Surgeon: [name], MD;  Location: [place] CV LAB;  Service: Cardiovascular;  Laterality: Left;  . NM GATED MYOVIEW ([place] HX)  February 2017   Likely breast attenuation. LOW RISK study. Normal EF 55-60%.  . OTHER SURGICAL HISTORY     08/2016 or 09/2016 surgery on achilles tenso h/o staph infection heal. Dr. [name]  . removal of left hematoma Left    leg  . REVERSE SHOULDER ARTHROPLASTY Right 10/08/2015   Procedure: REVERSE SHOULDER ARTHROPLASTY;  Surgeon: [name], MD;  Location: [facility] ORS;  Service: Orthopedics;  Laterality: Right;  . SLEEVE GASTROPLASTY    . TONSILLECTOMY    . TOTAL KNEE ARTHROPLASTY Left   . TOTAL SHOULDER REPLACEMENT Right   . TRANSESOPHAGEAL ECHOCARDIOGRAM  08/08/2013   Mild LVH, EF 60-65%. Moderate LA dilation and mild RA dilation. Mild MR with no evidence of stenosis and no evidence of endocarditis. A false chordae is noted.  . TRANSTHORACIC ECHOCARDIOGRAM  08/03/2013   Mild-Moderate concentric LVH, EF 60-65%. Normal diastolic function. Mild LA dilation. Mild MR with possible vegetation  - not confirmed on TEE   . ULNAR NERVE TRANSPOSITION Right 08/06/2016   Procedure: ULNAR NERVE DECOMPRESSION/TRANSPOSITION;  Surgeon: [name], MD;  Location: [facility] ORS;  Service: Orthopedics;  Laterality: Right;  . UPPER GI ENDOSCOPY    . VAGINAL HYSTERECTOMY     Current Facility-Administered Medications for the 02/04/20 encounter [facility] Encounter)  Medication  . ipratropium-albuterol (DUONEB) 0.5-2.5 (3) MG/3ML nebulizer solution 3 mL   Current Meds  Medication Sig  . acetaminophen (TYLENOL) 325 MG tablet Take 2 tablets (650 mg total) by mouth every 6 (six) hours as needed for mild pain or moderate pain (or Fever >/= 101).  Marland Kitchen albuterol (PROVENTIL) (2.5 MG/3ML) 0.083% nebulizer solution Take 2.5 mg by nebulization every 4  (four) hours as needed for wheezing or shortness of breath.  Marland Kitchen albuterol (VENTOLIN HFA) 108 (90 Base) MCG/ACT inhaler Inhale 2 puffs into the lungs every 6 (six) hours as needed for wheezing or shortness of breath.  . ALPRAZolam (XANAX) 0.5 MG tablet Take 0.5 mg by mouth at bedtime as needed for anxiety or sleep.  Marland Kitchen amLODipine (NORVASC) 5 MG tablet Take 1 tablet (5 mg total) by mouth daily.  Marland Kitchen apixaban (ELIQUIS) 5 MG TABS tablet Take 1 tablet (5 mg total) by mouth 2 (two) times daily.  Marland Kitchen buPROPion (WELLBUTRIN XL) 150 MG 24 hr tablet Take 1 tablet (150 mg total) by mouth daily.  . clobetasol  cream (TEMOVATE) 5.99 % Apply 1 application topically 2 (two) times daily as needed (skin irritation).   . colchicine 0.6 MG tablet Take 0.6 mg by mouth 2 (two) times daily as needed (acute gout flare).   . DULoxetine (CYMBALTA) 60 MG capsule Take 60 mg by mouth daily.  . ergocalciferol (VITAMIN D2) 1.25 MG (50000 UT) capsule Take 50,000 Units by mouth once a week.  . fluticasone-salmeterol (ADVAIR HFA) 115-21 MCG/ACT inhaler Inhale 2 puffs into the lungs daily. Rinse mouth thoroughly after use  . levocetirizine (XYZAL) 5 MG tablet Take 0.5 tablets (2.5 mg total) by mouth every evening. (Patient taking differently: Take 5 mg by mouth at bedtime as needed for allergies. )  . levothyroxine (SYNTHROID) 175 MCG tablet Take 1 tablet (175 mcg total) by mouth daily before breakfast.  . losartan-hydrochlorothiazide (HYZAAR) 50-12.5 MG tablet Take 1 tablet by mouth daily.  . metoprolol succinate (TOPROL-XL) 50 MG 24 hr tablet TAKE 1 TABLET BY MOUTH DAILY (Patient taking differently: Take 50 mg by mouth daily. )  . montelukast (SINGULAIR) 10 MG tablet Take 1 tablet (10 mg total) by mouth at bedtime.  . ondansetron (ZOFRAN) 4 MG tablet Take 1 tablet (4 mg total) by mouth every 8 (eight) hours as needed for nausea or vomiting.  Marland Kitchen oxybutynin (DITROPAN XL) 10 MG 24 hr tablet Take 10 mg by mouth at bedtime.  . pantoprazole  (PROTONIX) 40 MG tablet Take 40 mg by mouth daily.   . pregabalin (LYRICA) 50 MG capsule Take 1 capsule (50 mg total) by mouth 3 (three) times daily.  . rosuvastatin (CRESTOR) 20 MG tablet Take 1 tablet (20 mg total) by mouth daily.  . sitaGLIPtin (JANUVIA) 25 MG tablet Take 1 tablet (25 mg total) by mouth daily.  . sucralfate (CARAFATE) 1 GM/10ML suspension Take 1 g by mouth 3 (three) times daily.   . TOVIAZ 8 MG TB24 tablet TAKE ONE TABLET BY MOUTH EVERY DAY (Patient taking differently: Take 8 mg by mouth daily. )  . trospium (SANCTURA) 20 MG tablet Take 20 mg by mouth 2 (two) times daily.     Family History  Problem Relation Age of Onset  . Skin cancer Father   . Diabetes Father   . Hypertension Father   . Peripheral vascular disease Father   . Cancer Father   . Cerebral aneurysm Father   . Alcohol abuse Father   . Varicose Veins Mother   . Kidney disease Mother   . Arthritis Mother   . Mental illness Sister   . Cancer Maternal Aunt        breast  . Breast cancer Maternal Aunt   . Arthritis Maternal Grandmother   . Hypertension Maternal Grandmother   . Diabetes Maternal Grandmother   . Arthritis Maternal Grandfather   . Heart disease Maternal Grandfather   . Hypertension Maternal Grandfather   . Arthritis Paternal Grandmother   . Hypertension Paternal Grandmother   . Diabetes Paternal Grandmother   . Arthritis Paternal Grandfather   . Hypertension Paternal Grandfather   . Bladder Cancer Neg Hx   . Kidney cancer Neg Hx     Social History:  reports that she has never smoked. She has never used smokeless tobacco. She reports that she does not drink alcohol and does not use drugs. She worked as a Arboriculturist prior to retirement   Exam: Current vital signs: BP (!) 110/54 (BP Location: Right Arm)   Pulse 74   Temp 98 F (  36.7 C) (Oral)   Resp 17   Ht _0  (1.651 m)   Wt 109 kg   SpO2 99%   BMI 39.99 kg/m  Vital signs in last 24 hours: Temp:  [97.6  F (36.4 C)-98 F (36.7 C)] 98 F (36.7 C) (11/16 1124) Pulse Rate:  [57-74] 74 (11/16 1124) Resp:  [15-26] 17 (11/16 1124) BP: (98-149)/(44-70) 110/54 (11/16 1124) SpO2:  [93 %-100 %] 99 % (11/16 1124)   Physical Exam  Constitutional: Appears well-developed and well-nourished. Cushingoid appearance Psych: Affect appropriate to situation, cooperative Eyes: No scleral injection HENT: No OP obstruction, good dentition MSK: no joint deformities.  Cardiovascular: Perfusing extremities well Respiratory: Effort normal, non-labored breathing GI: Soft.  No distension. There is no tenderness.  Skin: Lesions on sole and Achilles of left foot   Neuro: Mental Status: Patient is awake, alert, oriented to person, place, month, year, and situation. Patient is able to give a clear and coherent history. No signs of aphasia or neglect Cranial Nerves: II: Visual Fields are full. Pupils are equal, round, and reactive to light.   III,IV, VI: EOMI without ptosis or diploplia.  V: Facial sensation is symmetric to temperature VII: Facial movement is symmetric.  VIII: hearing is intact to voice X: Uvula elevates symmetrically XI: Shoulder shrug is symmetric. XII: tongue is midline without atrophy or fasciculations.  Motor: Tone is guarded in all but the RUE where it is normal. Bulk is notable for distal > proximal muscle loss though difficult to fully assess secondary to central obesity. 5/5 strength was present in all four extremities.  Sensory: Length dependent loss of temperature, absent vibration at the toes, 9 seconds at the knees Deep Tendon Reflexes: 2+ and symmetric in the biceps and patellae. Plantars: Toes are mute bilaterally though also patient has difficulty relaxing due to pain Cerebellar: FNF and toe to hand are intact bilaterally      I have reviewed labs in epic and the results pertinent to this consultation are:  Lab Results  Component Value Date   HGBA1C 7.1 (H)  02/06/2020    Lab Results  Component Value Date   CHOL 139 02/06/2020   HDL 61 02/06/2020   LDLCALC 68 02/06/2020   TRIG 52 02/06/2020   CHOLHDL 2.3 02/06/2020   Lab Results  Component Value Date   TSH 2.051 07/29/2018   Lab Results  Component Value Date   ESRSEDRATE 47 (H) 12/19/2019   Lab Results  Component Value Date   CRP 8.8 (H) 09/04/2019   No results found for: RPR    Lab Results  Component Value Date   VITAMINB12 502 05/10/2017   CBC is notable for microcytic anemia (10.4 hemoglobin) with increased RDW to 17.2  CMP is notable for creatinine of 1.0, GFR 60, creatinine clearance 63.7  Urine analysis is notable for large leukocyte esterase, negative nitrates, few bacteria and 50 white blood cells, the culture was suggestive of contamination with multiple species identified   I have reviewed the images obtained:  MRI brain with punctate bilateral strokes, new compared to Dec 22 2019 imaging   Impression: This is a 69 year old woman with complex medical history as detailed above including multiple vascular risk factors as well as sleeve gastrectomy which puts her at risk for vitamin deficiencies.  Regarding her new strokes, these again are in multiple vascular territories.  She clearly describes how she maintains perfect medication compliance with pillboxes and adamantly denies missing any doses of Eliquis.  While her weight  is 109 kg on this admission, per review of Lexi comp in patients over 120 kg there is perhaps a 30% decrease in Cmax and 20% decrease in AUC of Eliquis but this is felt to be unlikely to require change in dosage.  Retrospective data in patients with VTE suggests no dosage assessment is necessary in patients weighing 100 to 300 kg.  Thus I do not think there is much benefit to changing her anticoagulation again.  However, will evaluate further for other risk factors that increase the risk of multifocal strokes.  For example if ESR/CRP are elevated, CT  chest abdomen pelvis would be indicated for malignancy screening.  If syphilis testing demonstrates concern for active treponemal infection, would consider lumbar puncture to rule out neurosyphilis. Overall, her stroke labs are fairly stable at this time (A1c has increased from 6.9 to 7.1 in a month; LDL has increased from 44 to 68 in the same timeframe though has typically been in the 60s and is still meeting goal < 70). Given she is already on [facility], ECHO will not change management.   Regarding her actual complaints of severely painful feet, this appears to be more related to worsening neuropathy.  I suspect that the etiology is most likely vitamin deficiency given her history of gastric sleeve operation, particularly B12 deficiency, in combination with her diabetes.  However given that this is leading to frequent hospital presentations and may also be contributing to intermittent altered mental status, will pursue a broad workup as below.   Recommendations:  # Strokes   -Additional stroke labs TSH, ESR, CRP, RPR, HIV  # Neuropathy -Empiric 1000 mcg B12 IM daily for 7 days, and then oral repletion -B12, B6, levels -thiamine level -Vitamin D (deficiency associated with painful neuropathy and diabetes) -Copper level  -Heavy metal screen -Please resume any other standard post-gastric sleeve surgery vitamins  -Outpatient EMG/NCS and skin biopsy should symptoms persist  # AMS - Ammonia - B3, B9  Lesleigh Noe MD-PhD [facility] [phone]

## 2020-02-06 NOTE — Consult Note (Signed)
[place] Nurse Consult Note: Patient receiving care in [facility].  Consult completed remotely after review of record and images of wounds. Reason for Consult: Left achilles and heel wounds Wound type: DFU for left heel.  Uncertain etiology of achilles injury. Pressure Injury POA: Yes/No/NA Measurement: To be provided by the bedside RN in the flowsheet section Wound bed: Achilles wound is dark and dry in appearance.  Heel wound is pink. Drainage (amount, consistency, odor) none specified in flowsheet area of chart Periwound: intact Dressing procedure/placement/frequency:  Wash left achilles tendon wound and left heel wound with soap and water, pat dry.  Apply skin prep barrier wipes Kellie Simmering [phone]) around wounds, and allow to air dry.  Then place a hydrocolloid dressing Kellie Simmering [phone]) over each wound.  Change daily. Monitor the wound area(s) for worsening of condition such as: Signs/symptoms of infection,  Increase in size,  Development of or worsening of odor, Development of pain, or increased pain at the affected locations.  Notify the medical team if any of these develop.  Thank you for the consult. [place] nurse will not follow at this time.  Please re-consult the [place] team if needed.  Val Riles, RN, MSN, CWOCN, CNS-BC, pager [phone]

## 2020-02-07 ENCOUNTER — Telehealth: Payer: Self-pay | Admitting: Internal Medicine

## 2020-02-07 ENCOUNTER — Inpatient Hospital Stay: Payer: PPO

## 2020-02-07 ENCOUNTER — Encounter: Payer: Self-pay | Admitting: Internal Medicine

## 2020-02-07 ENCOUNTER — Ambulatory Visit: Payer: PPO | Admitting: Internal Medicine

## 2020-02-07 DIAGNOSIS — G629 Polyneuropathy, unspecified: Secondary | ICD-10-CM

## 2020-02-07 DIAGNOSIS — E639 Nutritional deficiency, unspecified: Secondary | ICD-10-CM | POA: Diagnosis not present

## 2020-02-07 DIAGNOSIS — E440 Moderate protein-calorie malnutrition: Secondary | ICD-10-CM | POA: Diagnosis not present

## 2020-02-07 DIAGNOSIS — I10 Essential (primary) hypertension: Secondary | ICD-10-CM | POA: Diagnosis not present

## 2020-02-07 DIAGNOSIS — I6340 Cerebral infarction due to embolism of unspecified cerebral artery: Principal | ICD-10-CM

## 2020-02-07 DIAGNOSIS — L97529 Non-pressure chronic ulcer of other part of left foot with unspecified severity: Secondary | ICD-10-CM

## 2020-02-07 DIAGNOSIS — E1151 Type 2 diabetes mellitus with diabetic peripheral angiopathy without gangrene: Secondary | ICD-10-CM | POA: Diagnosis not present

## 2020-02-07 DIAGNOSIS — I639 Cerebral infarction, unspecified: Secondary | ICD-10-CM

## 2020-02-07 LAB — CBC WITH DIFFERENTIAL/PLATELET
Abs Immature Granulocytes: 0.01 10*3/uL (ref 0.00–0.07)
Basophils Absolute: 0.1 10*3/uL (ref 0.0–0.1)
Basophils Relative: 2 %
Eosinophils Absolute: 0.2 10*3/uL (ref 0.0–0.5)
Eosinophils Relative: 6 %
HCT: 30.5 % — ABNORMAL LOW (ref 36.0–46.0)
Hemoglobin: 9.6 g/dL — ABNORMAL LOW (ref 12.0–15.0)
Immature Granulocytes: 0 %
Lymphocytes Relative: 26 %
Lymphs Abs: 1.1 10*3/uL (ref 0.7–4.0)
MCH: 26 pg (ref 26.0–34.0)
MCHC: 31.5 g/dL (ref 30.0–36.0)
MCV: 82.7 fL (ref 80.0–100.0)
Monocytes Absolute: 0.4 10*3/uL (ref 0.1–1.0)
Monocytes Relative: 10 %
Neutro Abs: 2.3 10*3/uL (ref 1.7–7.7)
Neutrophils Relative %: 56 %
Platelets: 256 10*3/uL (ref 150–400)
RBC: 3.69 MIL/uL — ABNORMAL LOW (ref 3.87–5.11)
RDW: 17 % — ABNORMAL HIGH (ref 11.5–15.5)
WBC: 4.1 10*3/uL (ref 4.0–10.5)
nRBC: 0 % (ref 0.0–0.2)

## 2020-02-07 LAB — AMMONIA: Ammonia: <9 umol/L — ABNORMAL LOW (ref 9–35)

## 2020-02-07 LAB — RPR: RPR Ser Ql: NONREACTIVE

## 2020-02-07 LAB — VITAMIN B12: Vitamin B-12: 126 pg/mL — ABNORMAL LOW (ref 180–914)

## 2020-02-07 LAB — BASIC METABOLIC PANEL
Anion gap: 10 (ref 5–15)
BUN: 18 mg/dL (ref 8–23)
CO2: 28 mmol/L (ref 22–32)
Calcium: 8.9 mg/dL (ref 8.9–10.3)
Chloride: 101 mmol/L (ref 98–111)
Creatinine, Ser: 0.96 mg/dL (ref 0.44–1.00)
GFR, Estimated: >60 mL/min (ref >60)
Glucose, Bld: 144 mg/dL — ABNORMAL HIGH (ref 70–99)
Potassium: 4.7 mmol/L (ref 3.5–5.1)
Sodium: 139 mmol/L (ref 135–145)

## 2020-02-07 LAB — GLUCOSE, CAPILLARY
Glucose-Capillary: 117 mg/dL — ABNORMAL HIGH (ref 70–99)
Glucose-Capillary: 149 mg/dL — ABNORMAL HIGH (ref 70–99)
Glucose-Capillary: 179 mg/dL — ABNORMAL HIGH (ref 70–99)
Glucose-Capillary: 190 mg/dL — ABNORMAL HIGH (ref 70–99)

## 2020-02-07 LAB — FOLATE: Folate: 4.2 ng/mL — ABNORMAL LOW (ref >5.9)

## 2020-02-07 LAB — SEDIMENTATION RATE: Sed Rate: 60 mm/hr — ABNORMAL HIGH (ref 0–30)

## 2020-02-07 LAB — C-REACTIVE PROTEIN: CRP: 7 mg/dL — ABNORMAL HIGH (ref <1.0)

## 2020-02-07 LAB — HIV ANTIBODY (ROUTINE TESTING W REFLEX): HIV Screen 4th Generation wRfx: NONREACTIVE

## 2020-02-07 MED ORDER — FOLIC ACID 1 MG PO TABS
1.0000 mg | ORAL_TABLET | Freq: Every day | ORAL | Status: DC
  Administered 2020-02-07 – 2020-02-10 (×4): 1 mg via ORAL
  Filled 2020-02-07 (×4): qty 1

## 2020-02-07 MED ORDER — AMLODIPINE BESYLATE 5 MG PO TABS
5.0000 mg | ORAL_TABLET | Freq: Every day | ORAL | Status: DC
  Administered 2020-02-08 – 2020-02-10 (×3): 5 mg via ORAL
  Filled 2020-02-07 (×3): qty 1

## 2020-02-07 MED ORDER — THIAMINE HCL 100 MG PO TABS
50.0000 mg | ORAL_TABLET | Freq: Two times a day (BID) | ORAL | Status: DC
  Administered 2020-02-07 – 2020-02-10 (×7): 50 mg via ORAL
  Filled 2020-02-07 (×7): qty 1

## 2020-02-07 MED ORDER — ONDANSETRON HCL 4 MG/2ML IJ SOLN
4.0000 mg | Freq: Four times a day (QID) | INTRAMUSCULAR | Status: DC | PRN
  Administered 2020-02-07: 4 mg via INTRAVENOUS
  Filled 2020-02-07: qty 2

## 2020-02-07 MED ORDER — IOHEXOL 9 MG/ML PO SOLN
500.0000 mL | ORAL | Status: AC
  Administered 2020-02-07 (×2): 500 mL via ORAL

## 2020-02-07 MED ORDER — CALCIUM CARBONATE-VITAMIN D 500-200 MG-UNIT PO TABS
1.0000 | ORAL_TABLET | Freq: Two times a day (BID) | ORAL | Status: DC
  Administered 2020-02-07 – 2020-02-10 (×7): 1 via ORAL
  Filled 2020-02-07 (×6): qty 1

## 2020-02-07 MED ORDER — VITAMIN D 25 MCG (1000 UNIT) PO TABS
1000.0000 [IU] | ORAL_TABLET | Freq: Two times a day (BID) | ORAL | Status: DC
  Administered 2020-02-07 – 2020-02-10 (×7): 1000 [IU] via ORAL
  Filled 2020-02-07 (×6): qty 1

## 2020-02-07 MED ORDER — IOHEXOL 300 MG/ML  SOLN
100.0000 mL | Freq: Once | INTRAMUSCULAR | Status: AC | PRN
  Administered 2020-02-07: 18:00:00 100 mL via INTRAVENOUS

## 2020-02-07 MED ORDER — COLCHICINE 0.6 MG PO TABS
0.6000 mg | ORAL_TABLET | Freq: Two times a day (BID) | ORAL | Status: DC | PRN
  Filled 2020-02-07: qty 1

## 2020-02-07 MED ORDER — DICLOFENAC SODIUM 1 % EX GEL
2.0000 g | Freq: Three times a day (TID) | CUTANEOUS | Status: DC
  Administered 2020-02-08 – 2020-02-10 (×7): 2 g via TOPICAL
  Filled 2020-02-07 (×2): qty 100

## 2020-02-07 NOTE — Consult Note (Addendum)
PODIATRY / FOOT AND ANKLE SURGERY CONSULTATION NOTE  Requesting Physician: Lesia Sago MD  Reason for consult: Left posterior heel ulceration  Chief Complaint: Left heel wound   HPI: [name] is a 69 y.o. adult who presented to [facility] due to acute stroke with increased confusion and left-sided weakness as well as falls from home on 02/04/2020.  Patient is being worked up for this along with her other comorbidities.  Patient is a longtime patient of Dr. Selina Cooley who is treated in the past for a left plantar and posterior heel ulceration.  Patient continues to have recurrent ulcerations to these areas that need treatment from time to time.  Patient has noticed that the plantar left heel wound appears to be completely closed but she has gotten a blister or a black type scab to the back of the left heel.  Patient denies any nausea, vomiting, fever, chills.  Podiatry team was asked to assess the left lower extremity wound.  PMHx:  Past Medical History:  Diagnosis Date  . Abnormal antibody titer   . Anxiety and depression   . Arthritis   . Asthma   . Cystocele   . Depression   . Diabetes ([place])   . DVT (deep venous thrombosis) (HCC)    Righ calf  . Dyspnea    11/08/2018 - "more now due to lack of exercise"  . Dysrhythmia    afib  . Edema   . GERD (gastroesophageal reflux disease)   . Heart murmur    Echocardiogram June 2015: Mild MR (possible vegetation seen on TTE, not seen on TEE), normal LV size with moderate concentric LVH. Normal function EF 60-65%. Normal diastolic function. Mild LA dilation.  Marland Kitchen History of IBS   . History of kidney stones   . History of methicillin resistant staphylococcus aureus (MRSA) 2008  . Hyperlipidemia   . Hypertension   . Hypothyroidism   . Insomnia   . Kidney stone    kidney stones with lithotripsy .  [facility]- "adrenal glands"  . Neuropathy involving both lower extremities   . Obesity, Class II, BMI 35-39.9, with comorbidity   .  Paroxysmal atrial fibrillation ([place]) 2007   In June 2015, Cardiac Event Monitor: Mostly SR/sinus arrhythmia with PVCs that are frequent. Short bursts of A. fib lasting several minutes;; CHA2DS2-VASc Score = 5 (age, Female, PVD, DM, HTN)  . Peripheral vascular disease ([place])   . Sleep apnea    use C-PAP  . Urinary incontinence   . Venous stasis dermatitis of both lower extremities     Surgical Hx:  Past Surgical History:  Procedure Laterality Date  . ANKLE SURGERY Right   . APPLICATION OF WOUND VAC Left 06/27/2015   Procedure: APPLICATION OF WOUND VAC ( POSSIBLE ) ;  Surgeon: [name], MD;  Location: [facility] ORS;  Service: Vascular;  Laterality: Left;  . APPLICATION OF WOUND VAC Left 09/25/2016   Procedure: APPLICATION OF WOUND VAC;  Surgeon: [name], DPM;  Location: [facility] ORS;  Service: Podiatry;  Laterality: Left;  . arm surgery     right    . CHOLECYSTECTOMY    . COLONOSCOPY    . COLONOSCOPY WITH PROPOFOL N/A 01/11/2017   Procedure: COLONOSCOPY WITH PROPOFOL;  Surgeon: [name], MD;  Location: [facility] ENDOSCOPY;  Service: Endoscopy;  Laterality: N/A;  . COLONOSCOPY WITH PROPOFOL N/A 02/22/2017   Procedure: COLONOSCOPY WITH PROPOFOL;  Surgeon: [name], MD;  Location: [facility] ENDOSCOPY;  Service: Endoscopy;  Laterality:  N/A;  . COLONOSCOPY WITH PROPOFOL N/A 05/31/2017   Procedure: COLONOSCOPY WITH PROPOFOL;  Surgeon: [name], MD;  Location: [facility] ENDOSCOPY;  Service: Endoscopy;  Laterality: N/A;  . ESOPHAGOGASTRODUODENOSCOPY (EGD) WITH PROPOFOL N/A 01/11/2017   Procedure: ESOPHAGOGASTRODUODENOSCOPY (EGD) WITH PROPOFOL;  Surgeon: [name], MD;  Location: [facility] ENDOSCOPY;  Service: Endoscopy;  Laterality: N/A;  . ESOPHAGOGASTRODUODENOSCOPY (EGD) WITH PROPOFOL N/A 10/25/2018   Procedure: ESOPHAGOGASTRODUODENOSCOPY (EGD) WITH PROPOFOL;  Surgeon: [name], MD;  Location: [facility] ENDOSCOPY;  Service: Endoscopy;  Laterality: N/A;  .  ESOPHAGOGASTRODUODENOSCOPY (EGD) WITH PROPOFOL N/A 11/29/2019   Procedure: ESOPHAGOGASTRODUODENOSCOPY (EGD) WITH PROPOFOL;  Surgeon: Toledo, Benay Pike, MD;  Location: [facility] ENDOSCOPY;  Service: Gastroenterology;  Laterality: N/A;  . HERNIA REPAIR     umbilical  . HIATAL HERNIA REPAIR    . I & D EXTREMITY Left 06/27/2015   Procedure: IRRIGATION AND DEBRIDEMENT EXTREMITY            ( CALF HEMATOMA ) POSSIBLE WOUND VAC;  Surgeon: [name], MD;  Location: [facility] ORS;  Service: Vascular;  Laterality: Left;  . INCISION AND DRAINAGE OF WOUND Left 09/25/2016   Procedure: IRRIGATION AND DEBRIDEMENT - PARTIAL RESECTION OF ACHILLES TENDON WITH WOUND VAC APPLICATION;  Surgeon: [name], DPM;  Location: [facility] ORS;  Service: Podiatry;  Laterality: Left;  . INCISION AND DRAINAGE OF WOUND Left 11/09/2018   Procedure: Excision of left foot wound with ACell placement;  Surgeon: Wallace Going, DO;  Location: [place];  Service: Plastics;  Laterality: Left;  . IRRIGATION AND DEBRIDEMENT ABSCESS Left 09/07/2016   Procedure: IRRIGATION AND DEBRIDEMENT ABSCESS LEFT HEEL;  Surgeon: [name], DPM;  Location: [facility];  Service: Podiatry;  Laterality: Left;  . JOINT REPLACEMENT  2013   left knee replacement  . KIDNEY SURGERY Right    kidney stones  . LITHOTRIPSY    . LOWER EXTREMITY ANGIOGRAPHY Left 04/04/2018   Procedure: LOWER EXTREMITY ANGIOGRAPHY;  Surgeon: [name], MD;  Location: [place] CV LAB;  Service: Cardiovascular;  Laterality: Left;  . NM GATED MYOVIEW ([place] HX)  February 2017   Likely breast attenuation. LOW RISK study. Normal EF 55-60%.  . OTHER SURGICAL HISTORY     08/2016 or 09/2016 surgery on achilles tenso h/o staph infection heal. Dr. [name]  . removal of left hematoma Left    leg  . REVERSE SHOULDER ARTHROPLASTY Right 10/08/2015   Procedure: REVERSE SHOULDER ARTHROPLASTY;  Surgeon: [name], MD;  Location: [facility] ORS;  Service: Orthopedics;  Laterality: Right;  . SLEEVE  GASTROPLASTY    . TONSILLECTOMY    . TOTAL KNEE ARTHROPLASTY Left   . TOTAL SHOULDER REPLACEMENT Right   . TRANSESOPHAGEAL ECHOCARDIOGRAM  08/08/2013   Mild LVH, EF 60-65%. Moderate LA dilation and mild RA dilation. Mild MR with no evidence of stenosis and no evidence of endocarditis. A false chordae is noted.  . TRANSTHORACIC ECHOCARDIOGRAM  08/03/2013   Mild-Moderate concentric LVH, EF 60-65%. Normal diastolic function. Mild LA dilation. Mild MR with possible vegetation  - not confirmed on TEE   . ULNAR NERVE TRANSPOSITION Right 08/06/2016   Procedure: ULNAR NERVE DECOMPRESSION/TRANSPOSITION;  Surgeon: [name], MD;  Location: [facility] OR[facility];  Service: Orthopedics;  Laterality: Right;  . UPPER GI ENDOSCOPY    . VAGINAL HYSTERECTOMY      FHx:  Family History  Problem Relation Age of Onset  . Skin cancer Father   . Diabetes Father   . Hypertension Father   .  Peripheral vascular disease Father   . Cancer Father   . Cerebral aneurysm Father   . Alcohol abuse Father   . Varicose Veins Mother   . Kidney disease Mother   . Arthritis Mother   . Mental illness Sister   . Cancer Maternal Aunt        breast  . Breast cancer Maternal Aunt   . Arthritis Maternal Grandmother   . Hypertension Maternal Grandmother   . Diabetes Maternal Grandmother   . Arthritis Maternal Grandfather   . Heart disease Maternal Grandfather   . Hypertension Maternal Grandfather   . Arthritis Paternal Grandmother   . Hypertension Paternal Grandmother   . Diabetes Paternal Grandmother   . Arthritis Paternal Grandfather   . Hypertension Paternal Grandfather   . Bladder Cancer Neg Hx   . Kidney cancer Neg Hx     Social History:  reports that she has never smoked. She has never used smokeless tobacco. She reports that she does not drink alcohol and does not use drugs.  Allergies:  Allergies  Allergen Reactions  . Morphine And Related Other (See Comments) and Nausea Only    Patient becomes very  confused Other reaction(s): Other (See Comments), Other (See Comments) Patient becomes very confused Patient becomes very confused   . Penicillins Hives, Shortness Of Breath, Swelling and Other (See Comments)    Facial swelling Has patient had a PCN reaction causing immediate rash, facial/tongue/throat swelling, SOB or lightheadedness with hypotension: Yes Has patient had a PCN reaction causing severe rash involving mucus membranes or skin necrosis: Yes Has patient had a PCN reaction that required hospitalization No Has patient had a PCN reaction occurring within the last 10 years: No If all of the above answers are "NO", then may proceed with Cephalosporin use.  Other reaction(s): Other (See Comments), Other (See Comments) Facial swelling Has patient had a PCN reaction causing immediate rash, facial/tongue/throat swelling, SOB or lightheadedness with hypotension: Yes Has patient had a PCN reaction causing severe rash involving mucus membranes or skin necrosis: Yes Has patient had a PCN reaction that required hospitalization No Has patient had a PCN reaction occurring within the last 10 years: No If all of the above answers are "NO", then may proceed with Cephalosporin use. Facial swelling Has patient had a PCN reaction causing immediate rash, facial/tongue/throat swelling, SOB or lightheadedness with hypotension: Yes Has patient had a PCN reaction causing severe rash involving mucus membranes or skin necrosis: Yes Has patient had a PCN reaction that required hospitalization No Has patient had a PCN reaction occurring within the last 10 years: No If all of the above answers are "NO", then may proceed with Cephalosporin use. Facial swelling Has patient had a PCN reaction causing immediate rash, facial/tongue/throat swelling, SOB or lightheadedness with hypotension: Yes Has patient had a PCN reaction causing severe rash involving mucus membranes or skin necrosis: Yes Has patient had a PCN  reaction that required hospitalization No Has patient had a PCN reaction occurring within the last 10 years: No If all of the above answers are "NO", then may proceed with Cephalosporin use.   . Ambien [Zolpidem]     Hallucination   . Aspirin Hives  . Oxytetracycline Hives    Other reaction(s): Unknown     Review of Systems: General ROS: negative Respiratory ROS: no cough, shortness of breath, or wheezing Cardiovascular ROS: no chest pain or dyspnea on exertion Gastrointestinal ROS: no abdominal pain, change in bowel habits, or black or bloody stools  Musculoskeletal ROS: positive for - joint pain and joint stiffness Neurological ROS: positive for - confusion and numbness/tingling Dermatological ROS: positive for Left posterior heel eschar  Facility-Administered Medications Prior to Admission  Medication Dose Route Frequency Provider Last Rate Last Admin  . ipratropium-albuterol (DUONEB) 0.5-2.5 (3) MG/3ML nebulizer solution 3 mL  3 mL Nebulization Q6H [name], [name], MD       Medications Prior to Admission  Medication Sig Dispense Refill  . acetaminophen (TYLENOL) 325 MG tablet Take 2 tablets (650 mg total) by mouth every 6 (six) hours as needed for mild pain or moderate pain (or Fever >/= 101).    Marland Kitchen albuterol (PROVENTIL) (2.5 MG/3ML) 0.083% nebulizer solution Take 2.5 mg by nebulization every 4 (four) hours as needed for wheezing or shortness of breath.    Marland Kitchen albuterol (VENTOLIN HFA) 108 (90 Base) MCG/ACT inhaler Inhale 2 puffs into the lungs every 6 (six) hours as needed for wheezing or shortness of breath.    . ALPRAZolam (XANAX) 0.5 MG tablet Take 0.5 mg by mouth at bedtime as needed for anxiety or sleep.    Marland Kitchen amLODipine (NORVASC) 5 MG tablet Take 1 tablet (5 mg total) by mouth daily. 90 tablet 1  . apixaban (ELIQUIS) 5 MG TABS tablet Take 1 tablet (5 mg total) by mouth 2 (two) times daily. 180 tablet 2  . buPROPion (WELLBUTRIN XL) 150 MG 24 hr tablet Take 1 tablet (150  mg total) by mouth daily. 90 tablet 3  . clobetasol cream (TEMOVATE) 3.15 % Apply 1 application topically 2 (two) times daily as needed (skin irritation).     . colchicine 0.6 MG tablet Take 0.6 mg by mouth 2 (two) times daily as needed (acute gout flare).     . DULoxetine (CYMBALTA) 60 MG capsule Take 60 mg by mouth daily.    . ergocalciferol (VITAMIN D2) 1.25 MG (50000 UT) capsule Take 50,000 Units by mouth once a week.    . fluticasone-salmeterol (ADVAIR HFA) 115-21 MCG/ACT inhaler Inhale 2 puffs into the lungs daily. Rinse mouth thoroughly after use 1 Inhaler 12  . levocetirizine (XYZAL) 5 MG tablet Take 0.5 tablets (2.5 mg total) by mouth every evening. (Patient taking differently: Take 5 mg by mouth at bedtime as needed for allergies. ) 45 tablet 3  . levothyroxine (SYNTHROID) 175 MCG tablet Take 1 tablet (175 mcg total) by mouth daily before breakfast. 90 tablet 3  . losartan-hydrochlorothiazide (HYZAAR) 50-12.5 MG tablet Take 1 tablet by mouth daily.    . metoprolol succinate (TOPROL-XL) 50 MG 24 hr tablet TAKE 1 TABLET BY MOUTH DAILY (Patient taking differently: Take 50 mg by mouth daily. ) 90 tablet 1  . montelukast (SINGULAIR) 10 MG tablet Take 1 tablet (10 mg total) by mouth at bedtime. 90 tablet 3  . ondansetron (ZOFRAN) 4 MG tablet Take 1 tablet (4 mg total) by mouth every 8 (eight) hours as needed for nausea or vomiting. 90 tablet 1  . oxybutynin (DITROPAN XL) 10 MG 24 hr tablet Take 10 mg by mouth at bedtime.    . pantoprazole (PROTONIX) 40 MG tablet Take 40 mg by mouth daily.     . pregabalin (LYRICA) 50 MG capsule Take 1 capsule (50 mg total) by mouth 3 (three) times daily. 30 capsule 5  . rosuvastatin (CRESTOR) 20 MG tablet Take 1 tablet (20 mg total) by mouth daily. 30 tablet 5  . sitaGLIPtin (JANUVIA) 25 MG tablet Take 1 tablet (25 mg total) by mouth daily. 90 tablet  3  . sucralfate (CARAFATE) 1 GM/10ML suspension Take 1 g by mouth 3 (three) times daily.     . TOVIAZ 8 MG TB24  tablet TAKE ONE TABLET BY MOUTH EVERY DAY (Patient taking differently: Take 8 mg by mouth daily. ) 90 tablet 3  . trospium (SANCTURA) 20 MG tablet Take 20 mg by mouth 2 (two) times daily.    Marland Kitchen estradiol (ESTRACE) 0.1 MG/GM vaginal cream Place 1 Applicatorful vaginally every Monday, Wednesday, and Friday. Apply 0.27m (pea-sized amount)  just inside the vaginal introitus with a finger-tip on Monday, Wednesday and Friday nights, 42.5 g 11  . HYDROcodone-acetaminophen (NORCO/VICODIN) 5-325 MG tablet Take 1 tablet by mouth every 8 (eight) hours as needed for severe pain. Must last 30 days 12 tablet 0  . metFORMIN (GLUCOPHAGE) 500 MG tablet Take 1 tablet (500 mg total) by mouth 2 (two) times daily with a meal. With food (Patient not taking: Reported on 02/04/2020) 180 tablet 3    Physical Exam: General: Alert and oriented.  No apparent distress.  Vascular: DP/PT pulses nonpalpable bilateral.  No hair growth noted to digits bilateral.  Neuro: Light touch sensation diminished to bilateral lower extremities.  Derm: Plantar left heel previous ulcerated area appears to be well closed at this time.  No openings noted to this area or signs of infection.  Left posterior heel does appear to have a eschar present that is unstageable at this point.  Appears to be close to the mid substance of the Achilles tendon.  Does appear to be fairly small in nature overall though.  No erythema or edema or signs of infection, no drainage.    MSK: 4/5 strength bilateral lower extremities.  Results for orders placed or performed during the hospital encounter of 02/04/20 (from the past 48 hour(s))  CBG monitoring, ED     Status: Abnormal   Collection Time: 02/05/20 10:09 PM  Result Value Ref Range   Glucose-Capillary 222 (H) 70 - 99 mg/dL    Comment: Glucose reference range applies only to samples taken after fasting for at least 8 hours.  Glucose, capillary     Status: Abnormal   Collection Time: 02/05/20 11:50 PM   Result Value Ref Range   Glucose-Capillary 193 (H) 70 - 99 mg/dL    Comment: Glucose reference range applies only to samples taken after fasting for at least 8 hours.  Glucose, capillary     Status: None   Collection Time: 02/06/20  4:41 AM  Result Value Ref Range   Glucose-Capillary 83 70 - 99 mg/dL    Comment: Glucose reference range applies only to samples taken after fasting for at least 8 hours.  Vitamin B12     Status: Abnormal   Collection Time: 02/06/20  4:52 AM  Result Value Ref Range   Vitamin B-12 126 (L) 180 - 914 pg/mL    Comment: (NOTE) This assay is not validated for testing neonatal or myeloproliferative syndrome specimens for Vitamin B12 levels. Performed at [facility] [address] [place] [address]  Lipid panel     Status: None   Collection Time: 02/06/20  4:53 AM  Result Value Ref Range   Cholesterol 139 0 - 200 mg/dL   Triglycerides 52 <150 mg/dL   HDL 61 >40 mg/dL   Total CHOL/HDL Ratio 2.3 RATIO   VLDL 10 0 - 40 mg/dL   LDL Cholesterol 68 0 - 99 mg/dL    Comment:  Total Cholesterol/HDL:CHD Risk Coronary Heart Disease Risk Table                     Men   Women  1/2 Average Risk   3.4   3.3  Average Risk       5.0   4.4  2 X Average Risk   9.6   7.1  3 X Average Risk  23.4   11.0        Use the calculated Patient Ratio above and the CHD Risk Table to determine the patient's CHD Risk.        ATP III CLASSIFICATION (LDL):  <100     mg/dL   Optimal  100-129  mg/dL   Near or Above                    Optimal  130-159  mg/dL   Borderline  160-189  mg/dL   High  >190     mg/dL   Very High Performed at [facility], [address] [place], [place] [address]   Hemoglobin A1c     Status: Abnormal   Collection Time: 02/06/20  4:53 AM  Result Value Ref Range   Hgb A1c MFr Bld 7.1 (H) 4.8 - 5.6 %    Comment: (NOTE) Pre diabetes:          5.7%-6.4%  Diabetes:              >6.4%  Glycemic control for    <7.0% adults with diabetes    Mean Plasma Glucose 157.07 mg/dL    Comment: Performed at [facility] [address], [place], [place] [address]  Glucose, capillary     Status: Abnormal   Collection Time: 02/06/20  7:45 AM  Result Value Ref Range   Glucose-Capillary 109 (H) 70 - 99 mg/dL    Comment: Glucose reference range applies only to samples taken after fasting for at least 8 hours.  Glucose, capillary     Status: Abnormal   Collection Time: 02/06/20 11:22 AM  Result Value Ref Range   Glucose-Capillary 194 (H) 70 - 99 mg/dL    Comment: Glucose reference range applies only to samples taken after fasting for at least 8 hours.  TSH     Status: None   Collection Time: 02/06/20  2:31 PM  Result Value Ref Range   TSH 0.939 0.350 - 4.500 uIU/mL    Comment: Performed by a 3rd Generation assay with a functional sensitivity of <=0.01 uIU/mL. Performed at [facility], [address] [place], [place] [address]   RPR     Status: None   Collection Time: 02/06/20  2:31 PM  Result Value Ref Range   RPR Ser Ql NON REACTIVE NON REACTIVE    Comment: Performed at [facility] [address] [place], [place] [address]  HIV Antibody (routine testing w rflx)     Status: None   Collection Time: 02/06/20  2:31 PM  Result Value Ref Range   HIV Screen 4th Generation wRfx Non Reactive Non Reactive    Comment: (NOTE) Performed At: [facility] [address] [place], [place] [id number] Rush Farmer MD [id number]   Glucose, capillary     Status: Abnormal   Collection Time: 02/06/20  4:05 PM  Result Value Ref Range   Glucose-Capillary 173 (H) 70 - 99 mg/dL    Comment: Glucose reference range applies only to samples taken after fasting for  at least 8 hours.  Glucose, capillary     Status: Abnormal   Collection Time: 02/06/20  8:27 PM  Result Value Ref Range   Glucose-Capillary 165 (H) 70 - 99 mg/dL    Comment: Glucose reference range applies only to samples  taken after fasting for at least 8 hours.  CBC with Differential/Platelet     Status: Abnormal   Collection Time: 02/07/20  4:45 AM  Result Value Ref Range   WBC 4.1 4.0 - 10.5 K/uL   RBC 3.69 (L) 3.87 - 5.11 MIL/uL   Hemoglobin 9.6 (L) 12.0 - 15.0 g/dL   HCT 30.5 (L) 36 - 46 %   MCV 82.7 80.0 - 100.0 fL   MCH 26.0 26.0 - 34.0 pg   MCHC 31.5 30.0 - 36.0 g/dL   RDW 17.0 (H) 11.5 - 15.5 %   Platelets 256 150 - 400 K/uL   nRBC 0.0 0.0 - 0.2 %   Neutrophils Relative % 56 %   Neutro Abs 2.3 1.7 - 7.7 K/uL   Lymphocytes Relative 26 %   Lymphs Abs 1.1 0.7 - 4.0 K/uL   Monocytes Relative 10 %   Monocytes Absolute 0.4 0.1 - 1.0 K/uL   Eosinophils Relative 6 %   Eosinophils Absolute 0.2 0.0 - 0.5 K/uL   Basophils Relative 2 %   Basophils Absolute 0.1 0.0 - 0.1 K/uL   Immature Granulocytes 0 %   Abs Immature Granulocytes 0.01 0.00 - 0.07 K/uL    Comment: Performed at [facility], [address] [place], [place] [address]  Basic metabolic panel     Status: Abnormal   Collection Time: 02/07/20  4:45 AM  Result Value Ref Range   Sodium 139 135 - 145 mmol/L   Potassium 4.7 3.5 - 5.1 mmol/L   Chloride 101 98 - 111 mmol/L   CO2 28 22 - 32 mmol/L   Glucose, Bld 144 (H) 70 - 99 mg/dL    Comment: Glucose reference range applies only to samples taken after fasting for at least 8 hours.   BUN 18 8 - 23 mg/dL   Creatinine, Ser 0.96 0.44 - 1.00 mg/dL   Calcium 8.9 8.9 - 10.3 mg/dL   GFR, Estimated >60 >60 mL/min    Comment: (NOTE) Calculated using the CKD-EPI Creatinine Equation (2021)    Anion gap 10 5 - 15    Comment: Performed at [facility], [address] [place], [place] [address]  ESR     Status: Abnormal   Collection Time: 02/07/20  4:45 AM  Result Value Ref Range   Sed Rate 60 (H) 0 - 30 mm/hr    Comment: Performed at [facility], [address] [place], [place] [address]  Folate, serum, performed at [facility] lab     Status: Abnormal    Collection Time: 02/07/20  4:45 AM  Result Value Ref Range   Folate 4.2 (L) >5.9 ng/mL    Comment: Performed at [facility], [address] [place], [place] [address]  C-reactive protein     Status: Abnormal   Collection Time: 02/07/20  4:46 AM  Result Value Ref Range   CRP 7.0 (H) <1.0 mg/dL    Comment: Performed at [facility] [address], [place], [place] [address]  Ammonia     Status: Abnormal   Collection Time: 02/07/20  4:46 AM  Result Value Ref Range   Ammonia <9 (L) 9 - 35 umol/L  Comment: Performed at [facility], [address] [place], [place] [address]  Glucose, capillary     Status: Abnormal   Collection Time: 02/07/20  7:30 AM  Result Value Ref Range   Glucose-Capillary 117 (H) 70 - 99 mg/dL    Comment: Glucose reference range applies only to samples taken after fasting for at least 8 hours.  Glucose, capillary     Status: Abnormal   Collection Time: 02/07/20 11:54 AM  Result Value Ref Range   Glucose-Capillary 149 (H) 70 - 99 mg/dL    Comment: Glucose reference range applies only to samples taken after fasting for at least 8 hours.   MR BRAIN WO CONTRAST  Result Date: 02/05/2020 CLINICAL DATA:  Neuro deficit, acute, stroke suspected. EXAM: MRI HEAD WITHOUT CONTRAST TECHNIQUE: Multiplanar, multiecho pulse sequences of the brain and surrounding structures were obtained without intravenous contrast. COMPARISON:  Noncontrast head CT 02/04/2020. MRI/MRA head 12/22/2019. FINDINGS: Brain: Mild-to-moderate generalized cerebral atrophy. New from the prior MRI of 12/22/2019, there is a 3 mm cortical focus of restricted diffusion within the posterior left frontal lobe consistent with acute infarction (series 9, image 34). Also new from this prior exam, there is a 2 mm acute infarct within the left frontal lobe periventricular white matter (series 9, image 32). Unchanged small scattered chronic cortical infarcts, most notably within the right  parietal lobe (series 15, image 35). Background severe scattered and confluent T2/FLAIR hyperintensity within the cerebral white matter which is nonspecific, but compatible with chronic small vessel ischemic disease. This includes multiple chronic cerebral white matter small vessel infarcts. A chronic small-vessel infarct within the left centrum semiovale is new as compared to the prior MRI (series 9, image 33). Prominent perivascular spaces within the bilateral basal ganglia with likely superimposed chronic lacunar infarcts, unchanged. Redemonstrated chronic lacunar infarcts within the bilateral thalami. Comparatively mild chronic small vessel ischemic changes within the pons. There are few nonspecific scattered supratentorial chronic microhemorrhages, unchanged. No evidence of intracranial mass. No extra-axial fluid collection. No midline shift. Vascular: Expected proximal arterial flow voids. Skull and upper cervical spine: No focal marrow lesion. Sinuses/Orbits: Visualized orbits show no acute finding. Minimal left maxillary sinus mucosal thickening at the imaged levels. Trace left mastoid effusion. IMPRESSION: 3 mm acute cortical infarct within the posterior left frontal lobe. 2 mm acute infarct within the left frontal lobe periventricular white matter. Chronic small-vessel infarct within the left centrum semiovale, new as compared to MRI of 12/22/2019. Cerebral atrophy and severe chronic small vessel ischemic disease with multiple chronic infarcts, as detailed and otherwise unchanged. Electronically Signed   By: Kellie Simmering DO   On: 02/05/2020 18:49   DG Chest Port 1 View  Result Date: 02/05/2020 CLINICAL DATA:  Left-sided arm weakness and bilateral lower extremity weakness since Friday EXAM: PORTABLE CHEST 1 VIEW COMPARISON:  Radiograph 12/19/2019 FINDINGS: Chronic elevation of the right hemidiaphragm with some adjacent opacities, likely reflecting chronic scarring and/or atelectasis. There is some mild  vascular congestion. Airways thickening is present as well, possibly cuffing versus bronchitis. No pneumothorax. No effusion. No focal consolidative opacity. Cardiomediastinal contours are unremarkable. Prior reverse right shoulder arthroplasty. Degenerative changes in the acromioclavicular joints and left glenohumeral joint as well. Multilevel degenerative changes are present in the imaged portions of the spine. Remaining soft tissues are free of acute abnormality. Telemetry leads overlie the chest. IMPRESSION: 1. Mild vascular congestion. Airways thickening, possibly cuffing to suggest some early interstitial edema versus bronchitic change. 2. Chronic elevation of the right  hemidiaphragm with adjacent chronic opacity, likely atelectasis and scarring. Electronically Signed   By: Lovena Le M.D.   On: 02/05/2020 21:54    Blood pressure 138/74, pulse (!) 59, temperature 98.2 F (36.8 C), resp. rate 17, height '5\' 5"'  (1.651 m), weight 109 kg, SpO2 94 %.  Assessment 1. Pressure ulceration with stable eschar of the posterior aspect of the left Achilles tendon mid substance area, unstageable 2. Diabetes type 2 polyneuropathy 3. PVD  Plan -Patient seen and examined. -Appears to have a stable dry eschar to the posterior aspect of the left mid substance of the Achilles with no signs of infection present.  No debridement performed today to this area.  Continue to keep dry and stable.  Applied a Betadine wet-to-dry pressure-relief dressing to this area as well as to the plantar left heel which that ulcerated area appears to be completely healed today. -Patient to continue with Prevalon boots or waffle pads when in bed.  Appreciate wound care recommendations.  Recommend continue daily dressing changes. -Patient may be weightbearing as tolerated to the left foot but needs to be wearing something to protect the posterior heel area from rubbing against shoe or boot and needs pressure relief on the plantar heel.   Recommended to physical therapy wearing a wedge shoe for forefoot ambulation.  If patient has issues that she could discontinue wearing the cam boot. -Consultation placed to vascular surgery for further assessment and management.  Patient does have nonpalpable pulses and appears to have very fragile skin overall.  Patient has seen Dr. Lucky Cowboy in the past.  Podiatry team to follow peripherally at this time until discharge with the appropriate follow-up in outpatient clinic.  Caroline More, DPM 02/07/2020, 12:48 PM

## 2020-02-07 NOTE — TOC Progression Note (Signed)
Transition of Care [facility]) - Progression Note    Patient Details  Name: [name] MRN: [id number] Date of Birth: May 23, 1950  [facility] CM/SW Contact  Shelbie Hutching, RN Phone Number: 02/07/2020, 9:46 AM  Clinical Narrative:    RNCM spoke with patient's husband, [name] over the phone this morning to discuss discharge planning.  [name] would really like for the patient to go to [facility] but they are in copay days and he is not sure he can afford it.  [name] will call [facility] and then let this RNCM know if they would like to accept the bed.  If the patient does not go to [facility] she will have to go home with home health care.  Husband is concerned that he cannot take care of her and has no other family support that can assist at home.    Expected Discharge Plan: Skilled Nursing Facility Barriers to Discharge: Continued Medical Work up, Ship broker  Expected Discharge Plan and Services Expected Discharge Plan: [place]   Discharge Planning Services: CM Consult Post Acute Care Choice: [place] Living arrangements for the past 2 months: Single Family Home                                       Social Determinants of Health (SDOH) Interventions    Readmission Risk Interventions Readmission Risk Prevention Plan 07/30/2018  Transportation Screening Complete  PCP or Specialist Appt within 3-5 Days Complete  HRI or Home Care Consult Complete  Social Work Consult for [place] Planning/Counseling Patient refused  Palliative Care Screening Patient Refused  Medication Review Press photographer) Complete  Some recent data might be hidden

## 2020-02-07 NOTE — Progress Notes (Addendum)
PROGRESS NOTE    [name]   ZOX:[id number]  DOB: 1950/10/19  PCP: [name], [name], MD    DOA: 02/04/2020 LOS: 2   Brief Narrative   HPI from Dr Cherrie Distance is a 69 y.o. adult with medical history significant of recent admission for acute stroke, paroxysmal atrial fibrillation on Eliquis, DM2, morbid obesity, chronic pain syndrome, previous DVT, sleep apnea on CPAP, presented with increased confusion, worsening left-sided weakness, falls from home on 02/04/2020. Patient was also endorsing increased pain throughout in her left shoulder and feet. Reported bladder spasms, bladder incontinence, family initially was suspected UTI, but later reported that when patient had similar presentation, she was found to have a CVA. Last admit was a month ago for stroke of which her Pradaxa was switched to Eliquis. Pt has ASA allergy.  Patient was discharged to SNF and eventually discharged home.  In the ED, MRI showed new CVA, patient also managed for possible UTI.  Patient admitted for further management.     Assessment & Plan   Active Problems:   Paroxysmal atrial fibrillation (HCC); CHA2DS2-Vasc Score 5. On Pradaxa   Essential hypertension   Sleep apnea   DM (diabetes mellitus) type II, controlled, with peripheral vascular disorder (HCC)   Hypothyroidism, unspecified   Gastro-esophageal reflux disease without esophagitis   Morbid obesity with BMI of 40.0-44.9, adult (HCC)   Chronic pain syndrome   Recurrent falls   Acute embolic stroke ([place])   Hypertension associated with diabetes ([place])   Stroke ([place])   Malnutrition of moderate degree  Recurrent stroke / Acute cortical infarct of posterior left frontal lobe - POA, with recent stroke in Sept this year.  Pt had just returned home from rehab about 8-10 days prior to this admission.  Has been compliance with anticoagulation for A-fib.   Echo in 6/21 showed EF of 60 to 65%, no regional wall motion abnormality, no atrial  level shunt detected by color-flow Doppler. LDL at goal 68.  Hbg A1c is 7.1% --Neurology following, suspects recurrent strokes due to her malnutrition given hx of gastric sleeve and pt not taking vitamin supplements as prescribed (vit B12 deficiency can lead to hypercoagulable state).    --Continue Eliquis --PT/OT --Neuro checks --Telemetry monitoring --Per neuro, CT's for malignancy screening as part of hypercoagulable evaluation --Follow up pending labs (homocytsteine etc.) --Strict compliance with vitamin supplements  Abnormal UA - on admission UA showed large leukocytes, few bacteria, greater than 50 WBC. Cultlure grew multiple species, likely contamination.  Pt asymptomatic.  She was given fosfomycin on 02/05/20.   Monitor.  Hypertension - chronic, stable.  Meds were held for permissive HTN.  Resume amlodipine.  Monitor BP and resume Hyzaar when indicated.    Paroxysmal A-fib - rate controlled.  Continue Eliquis and metoprolol.  Type 2 Diabetes with Polyneuropathy - Hbg A1c is 7.1%, poorly controlled.  Hold metformin and Januvia.  Sliding scale Novolog. Continue Lyrica for neuropathy.  Unsteady Gait - with intolerance for weight-bearing on her feet.  This seems primarily due severe neuropathy.  Continue Lyrica, B12 supplementation as below.  PT/OT to follow.    Vitamin B12 deficiency - likely contributes to neuropathy, secondary to malnutrition s/p gastric sleeve and noncompliance with vitamin supplements.  Continue IM B12 injections daily x 7 days, then PO replacement. --Follow up pending labs: thiamine, B6, heavy metals screen.   --Consider outpatient EMC/NCS if persistent symptoms after B12 repletion.  Folic acid deficiency - continue oral supplement.  Vitamin D  deficiency - continue oral supplement.  Nonhealing Left Heel Ulcer - follows with podiatry, they are consulted.  Vascular was consulted at request of podiatry.  Recommend outpatient follow up in 1-2 weeks for repeat  ABI's (this and prior angiogram in 2019 showed good flow).   --Monitor closely for s/sx's of infection --wound care, daily dressing changes --off-load heels --wt bearing as tolerated on LLE, with wedge shoe recommended for forefoot ambulation  Hypothyroidism - continue Synthroid  Obstructive Sleep Apnea - CPAP ordered  Normocytic anemia - is likely multifactorial with B12 deficiency contributing.  Iron studies with AM labs.  Depression / Anxiety - continue Wellbutrin, Cymbalta PRN Xanax   Obesity: Body mass index is 39.99 kg/m.   Complicates overall care and prognosis.  Recommend lifestyle modifications.    DVT prophylaxis: SCD's Start: 02/06/20 0130 apixaban (ELIQUIS) tablet 5 mg   Diet:  Diet Orders (From admission, onward)    Start     Ordered   02/06/20 6270  Diet heart healthy/carb modified Room service appropriate? Yes; Fluid consistency: Thin  Diet effective now       Question Answer Comment  Diet-HS Snack? Nothing   Room service appropriate? Yes   Fluid consistency: Thin      02/06/20 3500            Code Status: Full Code    Subjective 02/07/20    Pt says she is exasperated by her current situation with recurrent stroke.  Just returned home from rehab and did well, able to ambulate etc.  She is very worried about not enough help at home if she is not able to return to rehab.  She denies pain, n/v/d, fever/chills, SOB, CP or other complaints at this time.   Disposition Plan & Communication   Status is: Inpatient  Remains inpatient appropriate because:Ongoing diagnostic testing needed not appropriate for outpatient work up, pending either home health or SNF placement   Dispo: The patient is from: Home              Anticipated d/c is to: SNF              Anticipated d/c date is: 1 day              Patient currently is not medically stable to d/c.   Family Communication: none at bedside, will attempt to call    Consults, Procedures, Significant Events     Consultants:   Neurology  Procedures:   None  Antimicrobials:  Anti-infectives (From admission, onward)   Start     Dose/Rate Route Frequency Ordered Stop   02/05/20 0000  methenamine (MANDELAMINE) tablet 1,000 mg        1,000 mg Oral 2 times daily 02/04/20 2350     02/04/20 2215  fosfomycin (MONUROL) packet 3 g        3 g Oral STAT 02/04/20 2210 02/04/20 2224   02/04/20 2100  sulfamethoxazole-trimethoprim (BACTRIM DS) 800-160 MG per tablet 1 tablet  Status:  Discontinued        1 tablet Oral 2 times daily 02/04/20 2047 02/04/20 2210         Objective   Vitals:   02/06/20 1602 02/06/20 2025 02/06/20 2337 02/07/20 0447  BP: 131/61 (!) 143/81 (!) 148/70 (!) 150/66  Pulse: 73 (!) 58 65 (!) 58  Resp: 17 20 18 18   Temp: 98.4 F (36.9 C) 98.1 F (36.7 C) 98.1 F (36.7 C) 98.1 F (36.7 C)  TempSrc:  SpO2: 95% 99% 95% 95%  Weight:      Height:        Intake/Output Summary (Last 24 hours) at 02/07/2020 0802 Last data filed at 02/07/2020 0539 Gross per 24 hour  Intake --  Output 1100 ml  Net -1100 ml   Filed Weights   02/04/20 1428  Weight: 109 kg    Physical Exam:  General exam: awake, alert, no acute distress, obese HEENT: moist mucus membranes, hearing grossly normal  Respiratory system: CTAB, no wheezes, rales or rhonchi, normal respiratory effort. Cardiovascular system: normal S1/S2, no pedal edema.   Gastrointestinal system: soft, NT, ND, +bowel sounds. Central nervous system: A&O x4. normal speech, RUE mildly weak vs left, R facial droop Extremities: moves all, left posterior heel dressing in place, offloading pads in place, moves all Skin: dry, intact, normal temperature Psychiatry: normal mood, congruent affect, judgement and insight appear normal  Labs   Data Reviewed: I have personally reviewed following labs and imaging studies  CBC: Recent Labs  Lab 02/04/20 1645 02/07/20 0445  WBC 7.0 4.1  NEUTROABS 5.3 2.3  HGB 10.4* 9.6*  HCT  33.2* 30.5*  MCV 82.2 82.7  PLT 238 381   Basic Metabolic Panel: Recent Labs  Lab 02/04/20 1645 02/07/20 0445  NA 140 139  K 4.4 4.7  CL 102 101  CO2 30 28  GLUCOSE 143* 144*  BUN 16 18  CREATININE 1.01* 0.96  CALCIUM 8.9 8.9   GFR: Estimated Creatinine Clearance (by C-G formula based on SCr of 0.96 mg/dL) Female: 67 mL/min Female: 81.5 mL/min Liver Function Tests: No results for input(s): AST, ALT, ALKPHOS, BILITOT, PROT, ALBUMIN in the last 168 hours. No results for input(s): LIPASE, AMYLASE in the last 168 hours. Recent Labs  Lab 02/07/20 0446  AMMONIA <9*   Coagulation Profile: No results for input(s): INR, PROTIME in the last 168 hours. Cardiac Enzymes: No results for input(s): CKTOTAL, CKMB, CKMBINDEX, TROPONINI in the last 168 hours. BNP (last 3 results) No results for input(s): PROBNP in the last 8760 hours. HbA1C: Recent Labs    02/06/20 0453  HGBA1C 7.1*   CBG: Recent Labs  Lab 02/06/20 0745 02/06/20 1122 02/06/20 1605 02/06/20 2027 02/07/20 0730  GLUCAP 109* 194* 173* 165* 117*   Lipid Profile: Recent Labs    02/06/20 0453  CHOL 139  HDL 61  LDLCALC 68  TRIG 52  CHOLHDL 2.3   Thyroid Function Tests: Recent Labs    02/06/20 1431  TSH 0.939   Anemia Panel: Recent Labs    02/07/20 0445  FOLATE 4.2*   Sepsis Labs: No results for input(s): PROCALCITON, LATICACIDVEN in the last 168 hours.  Recent Results (from the past 240 hour(s))  Urine culture     Status: Abnormal   Collection Time: 02/05/20  3:41 AM   Specimen: Urine, Random  Result Value Ref Range Status   Specimen Description   Final    URINE, RANDOM Performed at [facility], [address], [place], [place] [address]    Special Requests   Final    NONE Performed at [facility], [address] [place], [place] [address]    Culture MULTIPLE SPECIES PRESENT, SUGGEST RECOLLECTION (A)  Final   Report Status 02/06/2020 FINAL  Final  Respiratory  Panel by RT PCR (Flu A&B, Covid) - Nasopharyngeal Swab     Status: None   Collection Time: 02/05/20  3:41 AM   Specimen: Nasopharyngeal Swab  Result Value Ref Range Status  SARS Coronavirus 2 by RT PCR NEGATIVE NEGATIVE Final    Comment: (NOTE) SARS-CoV-2 target nucleic acids are NOT DETECTED.  The SARS-CoV-2 RNA is generally detectable in upper respiratoy specimens during the acute phase of infection. The lowest concentration of SARS-CoV-2 viral copies this assay can detect is 131 copies/mL. A negative result does not preclude SARS-Cov-2 infection and should not be used as the sole basis for treatment or other patient management decisions. A negative result may occur with  improper specimen collection/handling, submission of specimen other than nasopharyngeal swab, presence of viral mutation(s) within the areas targeted by this assay, and inadequate number of viral copies (<131 copies/mL). A negative result must be combined with clinical observations, patient history, and epidemiological information. The expected result is Negative.  Fact Sheet for Patients:  PinkCheek.be  Fact Sheet for Healthcare Providers:  GravelBags.it  This test is no t yet approved or cleared by the [place] FDA and  has been authorized for detection and/or diagnosis of SARS-CoV-2 by FDA under an Emergency Use Authorization (EUA). This EUA will remain  in effect (meaning this test can be used) for the duration of the COVID-19 declaration under Section 564(b)(1) of the Act, 21 U.S.C. section 360bbb-3(b)(1), unless the authorization is terminated or revoked sooner.     Influenza A by PCR NEGATIVE NEGATIVE Final   Influenza B by PCR NEGATIVE NEGATIVE Final    Comment: (NOTE) The Xpert Xpress SARS-CoV-2/FLU/RSV assay is intended as an aid in  the diagnosis of influenza from Nasopharyngeal swab specimens and  should not be used as a sole basis  for treatment. Nasal washings and  aspirates are unacceptable for Xpert Xpress SARS-CoV-2/FLU/RSV  testing.  Fact Sheet for Patients: PinkCheek.be  Fact Sheet for Healthcare Providers: GravelBags.it  This test is not yet approved or cleared by the [place] FDA and  has been authorized for detection and/or diagnosis of SARS-CoV-2 by  FDA under an Emergency Use Authorization (EUA). This EUA will remain  in effect (meaning this test can be used) for the duration of the  Covid-19 declaration under Section 564(b)(1) of the Act, 21  U.S.C. section 360bbb-3(b)(1), unless the authorization is  terminated or revoked. Performed at [facility], [address] [place], [place] [address]       Imaging Studies   MR BRAIN WO CONTRAST  Result Date: 02/05/2020 CLINICAL DATA:  Neuro deficit, acute, stroke suspected. EXAM: MRI HEAD WITHOUT CONTRAST TECHNIQUE: Multiplanar, multiecho pulse sequences of the brain and surrounding structures were obtained without intravenous contrast. COMPARISON:  Noncontrast head CT 02/04/2020. MRI/MRA head 12/22/2019. FINDINGS: Brain: Mild-to-moderate generalized cerebral atrophy. New from the prior MRI of 12/22/2019, there is a 3 mm cortical focus of restricted diffusion within the posterior left frontal lobe consistent with acute infarction (series 9, image 34). Also new from this prior exam, there is a 2 mm acute infarct within the left frontal lobe periventricular white matter (series 9, image 32). Unchanged small scattered chronic cortical infarcts, most notably within the right parietal lobe (series 15, image 35). Background severe scattered and confluent T2/FLAIR hyperintensity within the cerebral white matter which is nonspecific, but compatible with chronic small vessel ischemic disease. This includes multiple chronic cerebral white matter small vessel infarcts. A chronic small-vessel infarct  within the left centrum semiovale is new as compared to the prior MRI (series 9, image 33). Prominent perivascular spaces within the bilateral basal ganglia with likely superimposed chronic lacunar infarcts, unchanged. Redemonstrated chronic lacunar infarcts within the bilateral  thalami. Comparatively mild chronic small vessel ischemic changes within the pons. There are few nonspecific scattered supratentorial chronic microhemorrhages, unchanged. No evidence of intracranial mass. No extra-axial fluid collection. No midline shift. Vascular: Expected proximal arterial flow voids. Skull and upper cervical spine: No focal marrow lesion. Sinuses/Orbits: Visualized orbits show no acute finding. Minimal left maxillary sinus mucosal thickening at the imaged levels. Trace left mastoid effusion. IMPRESSION: 3 mm acute cortical infarct within the posterior left frontal lobe. 2 mm acute infarct within the left frontal lobe periventricular white matter. Chronic small-vessel infarct within the left centrum semiovale, new as compared to MRI of 12/22/2019. Cerebral atrophy and severe chronic small vessel ischemic disease with multiple chronic infarcts, as detailed and otherwise unchanged. Electronically Signed   By: Kellie Simmering DO   On: 02/05/2020 18:49   DG Chest Port 1 View  Result Date: 02/05/2020 CLINICAL DATA:  Left-sided arm weakness and bilateral lower extremity weakness since Friday EXAM: PORTABLE CHEST 1 VIEW COMPARISON:  Radiograph 12/19/2019 FINDINGS: Chronic elevation of the right hemidiaphragm with some adjacent opacities, likely reflecting chronic scarring and/or atelectasis. There is some mild vascular congestion. Airways thickening is present as well, possibly cuffing versus bronchitis. No pneumothorax. No effusion. No focal consolidative opacity. Cardiomediastinal contours are unremarkable. Prior reverse right shoulder arthroplasty. Degenerative changes in the acromioclavicular joints and left glenohumeral  joint as well. Multilevel degenerative changes are present in the imaged portions of the spine. Remaining soft tissues are free of acute abnormality. Telemetry leads overlie the chest. IMPRESSION: 1. Mild vascular congestion. Airways thickening, possibly cuffing to suggest some early interstitial edema versus bronchitic change. 2. Chronic elevation of the right hemidiaphragm with adjacent chronic opacity, likely atelectasis and scarring. Electronically Signed   By: Lovena Le M.D.   On: 02/05/2020 21:54     Medications   Scheduled Meds: . apixaban  5 mg Oral BID  . buPROPion  150 mg Oral Daily  . colchicine  0.6 mg Oral BID  . cyanocobalamin  1,000 mcg Intramuscular Daily   Followed by  . [START ON 02/13/2020] vitamin B-12  1,000 mcg Oral Daily  . DULoxetine  60 mg Oral Daily  . influenza vaccine adjuvanted  0.5 mL Intramuscular Tomorrow-1000  . insulin aspart  0-5 Units Subcutaneous QHS  . insulin aspart  0-9 Units Subcutaneous TID WC  . levothyroxine  175 mcg Oral Q0600  . methenamine  1,000 mg Oral BID  . metoprolol succinate  50 mg Oral Daily  . mometasone-formoterol  2 puff Inhalation BID  . montelukast  10 mg Oral QHS  . multivitamin with minerals  1 tablet Oral Daily  . oxybutynin  10 mg Oral QHS  . pantoprazole  40 mg Oral Daily  . pregabalin  50 mg Oral Daily  . rosuvastatin  20 mg Oral Daily  . senna-docusate  1 tablet Oral Once  . sucralfate  1 g Oral TID WC & HS   Continuous Infusions:     LOS: 2 days    Time spent: 30 minutes    Ezekiel Slocumb, DO [facility]  02/07/2020, 8:02 AM    If 7PM-7AM, please contact night-coverage. How to contact the [facility] Attending or Consulting provider [place] or covering provider during after hours [place], for this patient?    1. Check the care team in [facility] and look for a) attending/consulting [facility] provider listed and b) the [facility] team listed 2. Log into [email or web address] and use [place]'s universal password to  access. If you do not have the password, please contact the hospital operator. 3. Locate the [facility] provider you are looking for under [facility] and page to a number that you can be directly reached. 4. If you still have difficulty reaching the provider, please page the [facility] (Director on Call) for the Hospitalists listed on amion for assistance.

## 2020-02-07 NOTE — Progress Notes (Signed)
Neurology Progress Note  Patient ID: [name] is a 69 y.o. with PMHx of  has a past medical history of Abnormal antibody titer, Anxiety and depression, Arthritis, Asthma, Cystocele, Depression, Diabetes ([place]), DVT (deep venous thrombosis) ([place]), Dyspnea, Dysrhythmia, Edema, GERD (gastroesophageal reflux disease), Heart murmur, History of IBS, History of kidney stones, History of methicillin resistant staphylococcus aureus (MRSA) (2008), Hyperlipidemia, Hypertension, Hypothyroidism, Insomnia, Kidney stone, Neuropathy involving both lower extremities, Obesity, Class II, BMI 35-39.9, with comorbidity, Paroxysmal atrial fibrillation ([place]) (2007), Peripheral vascular disease ([place]), Sleep apnea, Urinary incontinence, and Venous stasis dermatitis of both lower extremities.  Initially consulted for: Recurrent strokes   Subjective: - Feels neuropathy is improving today  Exam: Vitals:   02/06/20 2337 02/07/20 0447  BP: (!) 148/70 (!) 150/66  Pulse: 65 (!) 58  Resp: 18 18  Temp: 98.1 F (36.7 C) 98.1 F (36.7 C)  SpO2: 95% 95%   Gen: In bed, comfortable  Resp: non-labored breathing, no grossly audible wheezing Cardiac: Perfusing extremities well  Abd: soft, nt  Neuro: MS: Alert, good recall of prior day's conversation CN: Face symmetric, track examiner well  Motor: LUE continues to be pain limited, freely moves RUE  Sensory: Pain in feet is improving and is most sensitive at the distal aspect of bilateral soles   Pertinent Labs:  TSH 0.939 ESR 60 (increased from 49 in 12/19/2019) CRP pending RPR pending HIV (-)  B12 pending B6 pending B1 level pending Vitamin D pending Copper level pending Heavy metal screen pending Ammonia < 9 B3 pending B9 (Folate) 4.2 (ref range > 5.9)   Lab Results  Component Value Date   HGBA1C 7.1 (H) 02/06/2020         Lab Results  Component Value Date   CHOL 139 02/06/2020   HDL 61 02/06/2020   LDLCALC 68 02/06/2020   TRIG 52  02/06/2020   CHOLHDL 2.3 02/06/2020      Impression: This is a 69 year old woman with multiple vascular risk factors as detailed previously, now improving with regards to her neuropathy symptoms with initiation of vitamin B12.  Thus far most of her her labs are pending although her folate returned low, supporting evidence of malabsorption in the setting of her gastric sleeve surgery and nonadherence to bariatric vitamin dosing.  We discussed at length the importance of high doses of vitamins and that a standard "geriatric multivitamin" that she has been taking at home is not sufficient given her anatomy.  We also discussed malignancy screening with a CT chest abdomen pelvis, which the patient was agreeable to.  Of note, B vitamin deficiency in particular has been associated with recurrent risk for stroke as homocystine elevations is associated with hypercoagulability.  While there are some conflicting reports on the causal evidence of this and potential harm in the setting of ESRD, given her normal kidney function and poor absorption secondary to gastric sleeve vitamin supplementation is critically important.  This was discussed at length with the patient   Reference:  "B vitamins for stroke prevention" [email or web address]  Recommendations: - Homocystine level  - Continued counseling on vitamin compliance - CT chest/abdomen/pelvis to eval for malignancy given rising ESR and recurrent strokes  Due to extended discussions with patient as above, 45 minutes were spent in patient care today of which over half was at [place] MD-PhD [facility] [phone]

## 2020-02-07 NOTE — Progress Notes (Signed)
PT Cancellation Note  Patient Details Name: [name] MRN: [id number] DOB: [birth year]   Cancelled Treatment:    Reason Eval/Treat Not Completed: Other (comment).  Upon PT arrival pt resting in bed with emesis bag in lap; working on drinking oral contrast for testing.  Pt reporting not feeling up to therapy at this time.  Will re-attempt PT treatment session at a later date/time.  Leitha Bleak, PT 02/07/20, 3:34 PM

## 2020-02-07 NOTE — Consult Note (Signed)
Brief pharmacy note  Pharmacy has been consulted to "order standard post-gastic sleeve vitamin supplements"  While [place] does not carry a bariatric multivitamin, pharmacy has discussed with registered dietician the difference between [place] multivitamin and a typical bariatric multivitamin and will order accordingly.  Patient is currently on:  MVT with minerals Folic acid 1mg  daily B12 injection followed by supplementation  Pharmacy will add:  Calcium, Vitamin D, and Thiamine  This does not include everything a bariatric multivitamin would provide, and  should resume regular bariatric supplementation at discharge.  Lu Duffel, PharmD, BCPS Clinical Pharmacist 02/07/2020 9:47 AM

## 2020-02-07 NOTE — Hospital Course (Signed)
HPI from Dr Cherrie Distance is a 69 y.o. adult with medical history significant of recent admission for acute stroke, paroxysmal atrial fibrillation on Eliquis, DM2, morbid obesity, chronic pain syndrome, previous DVT, sleep apnea on CPAP, presented with increased confusion, worsening left-sided weakness, falls from home on 02/04/2020. Patient was also endorsing increased pain throughout in her left shoulder and feet. Reported bladder spasms, bladder incontinence, family initially was suspected UTI, but later reported that when patient had similar presentation, she was found to have a CVA. Last admit was a month ago for stroke of which her Pradaxa was switched to Eliquis. Pt has ASA allergy.  Patient was discharged to SNF and eventually discharged home.  In the ED, MRI showed new CVA, patient also managed for possible UTI.  Patient admitted for further management.

## 2020-02-07 NOTE — Consult Note (Signed)
_0 @   Patient ID: [name], adult   DOB: Aug 06, 1950, 69 y.o.   MRN: [id number]  Chief Complaint  Patient presents with  . Foot Pain  . Shoulder Pain    HPI VIANCA [name] is a 69 y.o. adult.  I am asked to see the patient by Dr. Luana Shu for evaluation of left leg ulceration.  The patient is had a chronic ulceration on her left posterior heel and Achilles area just above the ankle proper.  She has had on and off ulceration on this left leg for many years.  We actually did an angiogram on her in late 2019 for nonhealing ulcerations where normal flow was seen with no intervention performed.  She has had her perfusion checked 1 more time in the office which showed normal ABIs with normal waveforms since that time, but it has been over a year since this has been checked.  She is actually admitted to the hospital with stroke.  She has a litany of medical issues as listed below.  Her skin is extremely thin and frail and getting ulcers healed on her has been extremely difficult in the past.  She has followed with podiatry and they are seeing her now.  No fevers or chills.  No signs of systemic infection.  As for stroke, she recently had a carotid duplex showing very mild disease bilaterally.   Past Medical History:  Diagnosis Date  . Abnormal antibody titer   . Anxiety and depression   . Arthritis   . Asthma   . Cystocele   . Depression   . Diabetes ([place])   . DVT (deep venous thrombosis) (HCC)    Righ calf  . Dyspnea    11/08/2018 - "more now due to lack of exercise"  . Dysrhythmia    afib  . Edema   . GERD (gastroesophageal reflux disease)   . Heart murmur    Echocardiogram June 2015: Mild MR (possible vegetation seen on TTE, not seen on TEE), normal LV size with moderate concentric LVH. Normal function EF 60-65%. Normal diastolic function. Mild LA dilation.  Marland Kitchen History of IBS   . History of kidney stones   . History of methicillin resistant staphylococcus aureus (MRSA) 2008  .  Hyperlipidemia   . Hypertension   . Hypothyroidism   . Insomnia   . Kidney stone    kidney stones with lithotripsy .  [facility]- "adrenal glands"  . Neuropathy involving both lower extremities   . Obesity, Class II, BMI 35-39.9, with comorbidity   . Paroxysmal atrial fibrillation ([place]) 2007   In June 2015, Cardiac Event Monitor: Mostly SR/sinus arrhythmia with PVCs that are frequent. Short bursts of A. fib lasting several minutes;; CHA2DS2-VASc Score = 5 (age, Female, PVD, DM, HTN)  . Peripheral vascular disease ([place])   . Sleep apnea    use C-PAP  . Urinary incontinence   . Venous stasis dermatitis of both lower extremities     Past Surgical History:  Procedure Laterality Date  . ANKLE SURGERY Right   . APPLICATION OF WOUND VAC Left 06/27/2015   Procedure: APPLICATION OF WOUND VAC ( POSSIBLE ) ;  Surgeon: [name], MD;  Location: [facility] ORS;  Service: Vascular;  Laterality: Left;  . APPLICATION OF WOUND VAC Left 09/25/2016   Procedure: APPLICATION OF WOUND VAC;  Surgeon: [name], DPM;  Location: [facility];  Service: Podiatry;  Laterality: Left;  . arm surgery     right    .  CHOLECYSTECTOMY    . COLONOSCOPY    . COLONOSCOPY WITH PROPOFOL N/A 01/11/2017   Procedure: COLONOSCOPY WITH PROPOFOL;  Surgeon: [name], MD;  Location: [facility] ENDOSCOPY;  Service: Endoscopy;  Laterality: N/A;  . COLONOSCOPY WITH PROPOFOL N/A 02/22/2017   Procedure: COLONOSCOPY WITH PROPOFOL;  Surgeon: [name], MD;  Location: [facility] ENDOSCOPY;  Service: Endoscopy;  Laterality: N/A;  . COLONOSCOPY WITH PROPOFOL N/A 05/31/2017   Procedure: COLONOSCOPY WITH PROPOFOL;  Surgeon: [name], MD;  Location: [facility] ENDOSCOPY;  Service: Endoscopy;  Laterality: N/A;  . ESOPHAGOGASTRODUODENOSCOPY (EGD) WITH PROPOFOL N/A 01/11/2017   Procedure: ESOPHAGOGASTRODUODENOSCOPY (EGD) WITH PROPOFOL;  Surgeon: [name], MD;  Location: [facility] ENDOSCOPY;  Service: Endoscopy;  Laterality: N/A;   . ESOPHAGOGASTRODUODENOSCOPY (EGD) WITH PROPOFOL N/A 10/25/2018   Procedure: ESOPHAGOGASTRODUODENOSCOPY (EGD) WITH PROPOFOL;  Surgeon: [name], MD;  Location: [facility] ENDOSCOPY;  Service: Endoscopy;  Laterality: N/A;  . ESOPHAGOGASTRODUODENOSCOPY (EGD) WITH PROPOFOL N/A 11/29/2019   Procedure: ESOPHAGOGASTRODUODENOSCOPY (EGD) WITH PROPOFOL;  Surgeon: Toledo, Benay Pike, MD;  Location: [facility] ENDOSCOPY;  Service: Gastroenterology;  Laterality: N/A;  . HERNIA REPAIR     umbilical  . HIATAL HERNIA REPAIR    . I & D EXTREMITY Left 06/27/2015   Procedure: IRRIGATION AND DEBRIDEMENT EXTREMITY            ( CALF HEMATOMA ) POSSIBLE WOUND VAC;  Surgeon: [name], MD;  Location: [facility] ORS;  Service: Vascular;  Laterality: Left;  . INCISION AND DRAINAGE OF WOUND Left 09/25/2016   Procedure: IRRIGATION AND DEBRIDEMENT - PARTIAL RESECTION OF ACHILLES TENDON WITH WOUND VAC APPLICATION;  Surgeon: [name], DPM;  Location: [facility] OR[facility];  Service: Podiatry;  Laterality: Left;  . INCISION AND DRAINAGE OF WOUND Left 11/09/2018   Procedure: Excision of left foot wound with ACell placement;  Surgeon: Wallace Going, DO;  Location: [facility];  Service: Plastics;  Laterality: Left;  . IRRIGATION AND DEBRIDEMENT ABSCESS Left 09/07/2016   Procedure: IRRIGATION AND DEBRIDEMENT ABSCESS LEFT HEEL;  Surgeon: [name], DPM;  Location: [facility];  Service: Podiatry;  Laterality: Left;  . JOINT REPLACEMENT  2013   left knee replacement  . KIDNEY SURGERY Right    kidney stones  . LITHOTRIPSY    . LOWER EXTREMITY ANGIOGRAPHY Left 04/04/2018   Procedure: LOWER EXTREMITY ANGIOGRAPHY;  Surgeon: [name], MD;  Location: [facility];  Service: Cardiovascular;  Laterality: Left;  . NM GATED MYOVIEW ([place] [facility])  February 2017   Likely breast attenuation. LOW RISK study. Normal EF 55-60%.  . OTHER SURGICAL HISTORY     08/2016 or 09/2016 surgery on achilles tenso h/o staph infection heal. Dr. [name]  . removal  of left hematoma Left    leg  . REVERSE SHOULDER ARTHROPLASTY Right 10/08/2015   Procedure: REVERSE SHOULDER ARTHROPLASTY;  Surgeon: [name], MD;  Location: [facility];  Service: Orthopedics;  Laterality: Right;  . SLEEVE GASTROPLASTY    . TONSILLECTOMY    . TOTAL KNEE ARTHROPLASTY Left   . TOTAL SHOULDER REPLACEMENT Right   . TRANSESOPHAGEAL ECHOCARDIOGRAM  08/08/2013   Mild LVH, EF 60-65%. Moderate LA dilation and mild RA dilation. Mild MR with no evidence of stenosis and no evidence of endocarditis. A false chordae is noted.  . TRANSTHORACIC ECHOCARDIOGRAM  08/03/2013   Mild-Moderate concentric LVH, EF 60-65%. Normal diastolic function. Mild LA dilation. Mild MR with possible vegetation  - not confirmed on TEE   . ULNAR NERVE TRANSPOSITION  Right 08/06/2016   Procedure: ULNAR NERVE DECOMPRESSION/TRANSPOSITION;  Surgeon: [name], MD;  Location: [facility];  Service: Orthopedics;  Laterality: Right;  . UPPER GI ENDOSCOPY    . VAGINAL HYSTERECTOMY       Family History  Problem Relation Age of Onset  . Skin cancer Father   . Diabetes Father   . Hypertension Father   . Peripheral vascular disease Father   . Cancer Father   . Cerebral aneurysm Father   . Alcohol abuse Father   . Varicose Veins Mother   . Kidney disease Mother   . Arthritis Mother   . Mental illness Sister   . Cancer Maternal Aunt        breast  . Breast cancer Maternal Aunt   . Arthritis Maternal Grandmother   . Hypertension Maternal Grandmother   . Diabetes Maternal Grandmother   . Arthritis Maternal Grandfather   . Heart disease Maternal Grandfather   . Hypertension Maternal Grandfather   . Arthritis Paternal Grandmother   . Hypertension Paternal Grandmother   . Diabetes Paternal Grandmother   . Arthritis Paternal Grandfather   . Hypertension Paternal Grandfather   . Bladder Cancer Neg Hx   . Kidney cancer Neg Hx      Social History   Tobacco Use  . Smoking status: Never Smoker  . Smokeless  tobacco: Never Used  Vaping Use  . Vaping Use: Never used  Substance Use Topics  . Alcohol use: No    Alcohol/week: 0.0 standard drinks  . Drug use: Never     Allergies  Allergen Reactions  . Morphine And Related Other (See Comments) and Nausea Only    Patient becomes very confused Other reaction(s): Other (See Comments), Other (See Comments) Patient becomes very confused Patient becomes very confused   . Penicillins Hives, Shortness Of Breath, Swelling and Other (See Comments)    Facial swelling Has patient had a PCN reaction causing immediate rash, facial/tongue/throat swelling, SOB or lightheadedness with hypotension: Yes Has patient had a PCN reaction causing severe rash involving mucus membranes or skin necrosis: Yes Has patient had a PCN reaction that required hospitalization No Has patient had a PCN reaction occurring within the last 10 years: No If all of the above answers are "NO", then may proceed with Cephalosporin use.  Other reaction(s): Other (See Comments), Other (See Comments) Facial swelling Has patient had a PCN reaction causing immediate rash, facial/tongue/throat swelling, SOB or lightheadedness with hypotension: Yes Has patient had a PCN reaction causing severe rash involving mucus membranes or skin necrosis: Yes Has patient had a PCN reaction that required hospitalization No Has patient had a PCN reaction occurring within the last 10 years: No If all of the above answers are "NO", then may proceed with Cephalosporin use. Facial swelling Has patient had a PCN reaction causing immediate rash, facial/tongue/throat swelling, SOB or lightheadedness with hypotension: Yes Has patient had a PCN reaction causing severe rash involving mucus membranes or skin necrosis: Yes Has patient had a PCN reaction that required hospitalization No Has patient had a PCN reaction occurring within the last 10 years: No If all of the above answers are "NO", then may proceed with  Cephalosporin use. Facial swelling Has patient had a PCN reaction causing immediate rash, facial/tongue/throat swelling, SOB or lightheadedness with hypotension: Yes Has patient had a PCN reaction causing severe rash involving mucus membranes or skin necrosis: Yes Has patient had a PCN reaction that required hospitalization No Has patient had a PCN  reaction occurring within the last 10 years: No If all of the above answers are "NO", then may proceed with Cephalosporin use.   . Ambien [Zolpidem]     Hallucination   . Aspirin Hives  . Oxytetracycline Hives    Other reaction(s): Unknown     Current Facility-Administered Medications  Medication Dose Route Frequency Provider Last Rate Last Admin  . acetaminophen (TYLENOL) tablet 650 mg  650 mg Oral Q4H PRN Toy Baker, MD   650 mg at 02/07/20 1257   Or  . acetaminophen (TYLENOL) 160 MG/5ML solution 650 mg  650 mg Per Tube Q4H PRN Toy Baker, MD       Or  . acetaminophen (TYLENOL) suppository 650 mg  650 mg Rectal Q4H PRN Doutova, Anastassia, MD      . albuterol (PROVENTIL) (2.5 MG/3ML) 0.083% nebulizer solution 2.5 mg  2.5 mg Nebulization Q4H PRN Alma Friendly, MD      . ALPRAZolam Duanne Moron) tablet 0.5 mg  0.5 mg Oral QHS PRN Alma Friendly, MD      . apixaban Arne Cleveland) tablet 5 mg  5 mg Oral BID Toy Baker, MD   5 mg at 02/07/20 0758  . buPROPion (WELLBUTRIN XL) 24 hr tablet 150 mg  150 mg Oral Daily Doutova, Anastassia, MD   150 mg at 02/07/20 0758  . calcium-vitamin D (OSCAL WITH D) 500-200 MG-UNIT per tablet 1 tablet  1 tablet Oral BID WC Lu Duffel, [facility]   1 tablet at 02/07/20 1142  . cholecalciferol (VITAMIN D3) tablet 1,000 Units  1,000 Units Oral BID WC Lu Duffel, RPH   1,000 Units at 02/07/20 1142  . colchicine tablet 0.6 mg  0.6 mg Oral BID PRN Shanlever, Pierce Crane, RPH      . cyanocobalamin ((VITAMIN B-12)) injection 1,000 mcg  1,000 mcg Intramuscular Daily Bhagat, Srishti  L, MD   1,000 mcg at 02/06/20 2214   Followed by  . [START ON 02/13/2020] vitamin B-12 (CYANOCOBALAMIN) tablet 1,000 mcg  1,000 mcg Oral Daily Bhagat, Srishti L, MD      . diclofenac Sodium (VOLTAREN) 1 % topical gel 2 g  2 g Topical TID Bhagat, Srishti L, MD      . DULoxetine (CYMBALTA) DR capsule 60 mg  60 mg Oral Daily Doutova, Anastassia, MD   60 mg at 02/07/20 0757  . folic acid (FOLVITE) tablet 1 mg  1 mg Oral Daily Bhagat, Srishti L, MD   1 mg at 02/07/20 0946  . HYDROcodone-acetaminophen (NORCO/VICODIN) 5-325 MG per tablet 1 tablet  1 tablet Oral Q8H PRN Alma Friendly, MD   1 tablet at 02/07/20 0803  . insulin aspart (novoLOG) injection 0-5 Units  0-5 Units Subcutaneous QHS Alma Friendly, MD      . insulin aspart (novoLOG) injection 0-9 Units  0-9 Units Subcutaneous TID WC Alma Friendly, MD   1 Units at 02/07/20 1200  . iohexol (OMNIPAQUE) 9 MG/ML oral solution 500 mL  500 mL Oral Q1H Bhagat, Srishti L, MD      . levothyroxine (SYNTHROID) tablet 175 mcg  175 mcg Oral Q0600 Toy Baker, MD   175 mcg at 02/07/20 0753  . methenamine (MANDELAMINE) tablet 1,000 mg  1,000 mg Oral BID Toy Baker, MD   1,000 mg at 02/07/20 0945  . metoprolol succinate (TOPROL-XL) 24 hr tablet 50 mg  50 mg Oral Daily Alma Friendly, MD   50 mg at 02/07/20 0946  . mometasone-formoterol (DULERA) 200-5 MCG/ACT  inhaler 2 puff  2 puff Inhalation BID Toy Baker, MD   2 puff at 02/07/20 0754  . montelukast (SINGULAIR) tablet 10 mg  10 mg Oral QHS Alma Friendly, MD   10 mg at 02/06/20 2210  . multivitamin with minerals tablet 1 tablet  1 tablet Oral Daily Alma Friendly, MD   1 tablet at 02/07/20 0758  . ondansetron (ZOFRAN) injection 4 mg  4 mg Intravenous Q6H PRN Nicole Kindred A, DO   4 mg at 02/07/20 1351  . oxybutynin (DITROPAN-XL) 24 hr tablet 10 mg  10 mg Oral QHS Alma Friendly, MD   10 mg at 02/06/20 2210  . pantoprazole (PROTONIX) EC tablet  40 mg  40 mg Oral Daily Doutova, Nyoka Lint, MD   40 mg at 02/07/20 0758  . pregabalin (LYRICA) capsule 50 mg  50 mg Oral Daily Doutova, Anastassia, MD   50 mg at 02/07/20 0758  . rosuvastatin (CRESTOR) tablet 20 mg  20 mg Oral Daily Alma Friendly, MD   20 mg at 02/07/20 0758  . senna-docusate (Senokot-S) tablet 1 tablet  1 tablet Oral Once Doutova, Anastassia, MD      . sucralfate (CARAFATE) 1 GM/10ML suspension 1 g  1 g Oral TID WC & HS Doutova, Anastassia, MD   1 g at 02/07/20 1142  . thiamine tablet 50 mg  50 mg Oral BID WC Lu Duffel, RPH   50 mg at 02/07/20 1142      REVIEW OF SYSTEMS (Negative unless checked)  Constitutional: _0 Weight loss  _1 Fever  _2 Chills Cardiac: _3 Chest pain   _4 Chest pressure   _5 Palpitations   _6 Shortness of breath when laying flat   _7 Shortness of breath at rest   _8 Shortness of breath with exertion. Vascular:  _9 Pain in legs with walking   _10 Pain in legs at rest   _11 Pain in legs when laying flat   _12 Claudication   _13 Pain in feet when walking  _14 Pain in feet at rest  _15 Pain in feet when laying flat   _16 History of DVT   _17 Phlebitis   _18 Swelling in legs   _19 Varicose veins   _20 Non-healing ulcers Pulmonary:   _21 Uses home oxygen   _22 Productive cough   _23 Hemoptysis   _24 Wheeze  _25 COPD   _26 Asthma Neurologic:  _27 Dizziness  _28 Blackouts   _29 Seizures   _30 History of stroke   _31 History of TIA  _32 Aphasia   _33 Temporary blindness   _34 Dysphagia   _35 Weakness or numbness in arms   _36 Weakness or numbness in legs Musculoskeletal:  _37 Arthritis   _38 Joint swelling   _39 Joint pain   _40 Low back pain Hematologic:  _41 Easy bruising  _42 Easy bleeding   _43 Hypercoagulable state   _44 Anemic  _45 Hepatitis Gastrointestinal:  _46 Blood in stool   _47 Vomiting blood  _48 Gastroesophageal reflux/heartburn   _49 Abdominal pain Genitourinary:  _50 Chronic kidney disease   _51 Difficult urination  _52 Frequent urination  _53 Burning with urination   _54 Hematuria Skin:  _55 Rashes   _56 Ulcers    _57 Wounds Psychological:  _58 History of anxiety   _59  History of major depression.    Physical Exam BP 138/74 (BP Location: Right Arm)   Pulse (!) 59   Temp 98.2 F (36.8 C)   Resp 17   Ht _60  (1.651 m)   Wt 109 kg   SpO2 94%   BMI 39.99 kg/m  Gen:  WD/WN, NAD Head: [place]/AT, No temporalis wasting.  Ear/Nose/Throat: Hearing grossly intact, nares w/o erythema or drainage, oropharynx w/o Erythema/Exudate Eyes: Conjunctiva clear, sclera non-icteric  Neck: trachea midline.  No JVD.  Pulmonary:  Good air movement, respirations not labored, no use of accessory muscles  Cardiac: somewhat bradycardic Vascular:  Vessel Right Left  Radial Palpable Palpable                          DP 2+ 1+  PT 1+ Unable to palpate due to dressing   Gastrointestinal:. No masses, surgical incisions, or scars. Musculoskeletal: M/S 5/5 throughout.  Ulceration is currently dressed today.  This was seen in the pictures in the chart.  No erythema. Neurologic: Sensation grossly intact in extremities.  Symmetrical.  Speech is fluent. Motor exam as listed above. Psychiatric: Judgment intact, Mood & affect appropriate for pt's clinical situation. Dermatologic: Left posterior heel/lower Achilles area ulceration as above dressed currently.    Radiology CT Head Wo Contrast  Result Date: 02/04/2020 CLINICAL DATA:  New onset of headache. EXAM: CT HEAD WITHOUT CONTRAST TECHNIQUE: Contiguous axial images were obtained from the base of the skull through the vertex without intravenous contrast. COMPARISON:  December 21, 2019 head CT December 22, 2019, head MRI FINDINGS: Brain: No evidence of acute infarction, hemorrhage, hydrocephalus, extra-axial collection or mass lesion/mass effect. Advanced deep white matter microangiopathy. Mild brain parenchymal volume loss. Stable areas of hypoattenuation when compared to patient's most recent brain MRI, reflecting chronic ischemic infarctions, the largest in the right centrum  semiovale. Vascular: No hyperdense vessel or unexpected calcification. Skull: Normal. Negative for fracture or focal lesion. Sinuses/Orbits: No acute finding. Other: None. IMPRESSION: 1. No acute intracranial abnormality. 2. Advanced deep white matter microangiopathy. 3. Stable areas of hypoattenuation when compared to patient's most recent brain MRI, reflecting chronic ischemic infarctions, the largest in the right centrum semiovale. Electronically Signed   By: Fidela Salisbury M.D.   On: 02/04/2020 17:27   MR BRAIN WO CONTRAST  Result Date: 02/05/2020 CLINICAL DATA:  Neuro deficit, acute, stroke suspected. EXAM: MRI HEAD WITHOUT CONTRAST TECHNIQUE: Multiplanar, multiecho pulse sequences of the brain and surrounding structures were obtained without intravenous contrast. COMPARISON:  Noncontrast head CT 02/04/2020. MRI/MRA head 12/22/2019. FINDINGS: Brain: Mild-to-moderate generalized cerebral atrophy. New from the prior MRI of 12/22/2019, there is a 3 mm cortical focus of restricted diffusion within the posterior left frontal lobe consistent with acute infarction (series 9, image 34). Also new from this prior exam, there is a 2 mm acute infarct within the left frontal lobe periventricular white matter (series 9, image 32). Unchanged small scattered chronic cortical infarcts, most notably within the right parietal lobe (series 15, image 35). Background severe scattered and confluent T2/FLAIR hyperintensity within the cerebral white matter which is nonspecific, but compatible with chronic small vessel ischemic disease. This includes multiple chronic cerebral white matter small vessel infarcts. A chronic small-vessel infarct within the left centrum semiovale is new as compared to the prior MRI (series 9, image 33). Prominent perivascular spaces within the bilateral basal ganglia with likely superimposed chronic lacunar infarcts, unchanged. Redemonstrated chronic lacunar infarcts within the bilateral thalami.  Comparatively mild chronic small vessel ischemic changes within the pons. There are few nonspecific scattered supratentorial chronic microhemorrhages, unchanged. No evidence of intracranial mass. No extra-axial fluid collection. No midline shift. Vascular: Expected proximal arterial flow voids. Skull and upper cervical spine: No focal marrow lesion. Sinuses/Orbits: Visualized orbits show no acute finding. Minimal left maxillary sinus mucosal thickening at the imaged levels. Trace left mastoid effusion. IMPRESSION: 3 mm acute cortical infarct within the posterior left frontal lobe. 2 mm acute infarct within  the left frontal lobe periventricular white matter. Chronic small-vessel infarct within the left centrum semiovale, new as compared to MRI of 12/22/2019. Cerebral atrophy and severe chronic small vessel ischemic disease with multiple chronic infarcts, as detailed and otherwise unchanged. Electronically Signed   By: Kellie Simmering DO   On: 02/05/2020 18:49   DG Chest Port 1 View  Result Date: 02/05/2020 CLINICAL DATA:  Left-sided arm weakness and bilateral lower extremity weakness since Friday EXAM: PORTABLE CHEST 1 VIEW COMPARISON:  Radiograph 12/19/2019 FINDINGS: Chronic elevation of the right hemidiaphragm with some adjacent opacities, likely reflecting chronic scarring and/or atelectasis. There is some mild vascular congestion. Airways thickening is present as well, possibly cuffing versus bronchitis. No pneumothorax. No effusion. No focal consolidative opacity. Cardiomediastinal contours are unremarkable. Prior reverse right shoulder arthroplasty. Degenerative changes in the acromioclavicular joints and left glenohumeral joint as well. Multilevel degenerative changes are present in the imaged portions of the spine. Remaining soft tissues are free of acute abnormality. Telemetry leads overlie the chest. IMPRESSION: 1. Mild vascular congestion. Airways thickening, possibly cuffing to suggest some early  interstitial edema versus bronchitic change. 2. Chronic elevation of the right hemidiaphragm with adjacent chronic opacity, likely atelectasis and scarring. Electronically Signed   By: Lovena Le M.D.   On: 02/05/2020 21:54    Labs Recent Results (from the past 2160 hour(s))  Bladder Scan (Post Void Residual) in office     Status: None   Collection Time: 11/17/19 11:38 AM  Result Value Ref Range   Scan Result 36m   SARS CORONAVIRUS 2 (TAT 6-24 HRS) Nasopharyngeal Nasopharyngeal Swab     Status: None   Collection Time: 11/28/19  2:05 PM   Specimen: Nasopharyngeal Swab  Result Value Ref Range   SARS Coronavirus 2 NEGATIVE NEGATIVE    Comment: (NOTE) SARS-CoV-2 target nucleic acids are NOT DETECTED.  The SARS-CoV-2 RNA is generally detectable in upper and lower respiratory specimens during the acute phase of infection. Negative results do not preclude SARS-CoV-2 infection, do not rule out co-infections with other pathogens, and should not be used as the sole basis for treatment or other patient management decisions. Negative results must be combined with clinical observations, patient history, and epidemiological information. The expected result is Negative.  Fact Sheet for Patients: hSugarRoll.be Fact Sheet for Healthcare Providers: hhttps://www.woods-mathews.com/ This test is not yet approved or cleared by the [place] and  has been authorized for detection and/or diagnosis of SARS-CoV-2 by FDA under an Emergency Use Authorization (EUA). This EUA will remain  in effect (meaning this test can be used) for the duration of the COVID-19 declaration under Se ction 564(b)(1) of the Act, 21 U.S.C. section 360bbb-3(b)(1), unless the authorization is terminated or revoked sooner.  Performed at [facility] [id number], [place] [address]  [employer] (MW), Urine     Status: None   Collection Time:  11/28/19  2:45 PM  Result Value Ref Range   Summary Note     Comment: ==================================================================== [employer] (MW) ==================================================================== Test                             Result       Flag       Units  Drug Present and Declared for Prescription Verification   Alprazolam  79           EXPECTED   ng/mg creat   Alpha-hydroxyalprazolam        80           EXPECTED   ng/mg creat    Source of alprazolam is a scheduled prescription medication. Alpha-    hydroxyalprazolam is an expected metabolite of alprazolam.  Drug Absent but Declared for Prescription Verification   Hydrocodone                    Not Detected UNEXPECTED ng/mg creat ==================================================================== Test                      Result    Flag   Units      Ref Range   Creatinine              70               mg/dL      >=20 ==================================================================== Declared Medications:  The flagging and in terpretation on this report are based on the  following declared medications.  Unexpected results may arise from  inaccuracies in the declared medications.   **Note: The testing scope of this panel includes these medications:   Alprazolam (Xanax)  Hydrocodone   **Note: The testing scope of this panel does not include the  following reported medications:   Acetaminophen (Tylenol)  Acetaminophen  Albuterol  Amlodipine (Norvasc)  Bupropion (Wellbutrin XL)  Dabigatran (Pradaxa)  Duloxetine  Estradiol (Estrace)  Fesoterodine (Toviaz)  Fluticasone (Advair)  Hydrochlorothiazide (Hyzaar)  Levocetirizine (Xyzal)  Levofloxacin  Levothyroxine  Losartan (Hyzaar)  Metformin  Metoprolol  Montelukast  Nystatin  Ondansetron  Pantoprazole  Pregabalin (Lyrica)  Salmeterol (Advair)  Topical  Vitamin D2 (Ergocalciferol)  Zinc  Oxide ==================================================================== For clinical consultation, please call [phone]. ============ ========================================================   Glucose, capillary     Status: Abnormal   Collection Time: 11/29/19 10:06 AM  Result Value Ref Range   Glucose-Capillary 146 (H) 70 - 99 mg/dL    Comment: Glucose reference range applies only to samples taken after fasting for at least 8 hours.   Comment 1 Document in Chart   KOH prep     Status: None   Collection Time: 11/29/19 10:50 AM   Specimen: Esophagus  Result Value Ref Range   Specimen Description ESOPHAGUS    Special Requests Normal    KOH Prep      YEAST WITH PSEUDOHYPHAE Performed at [facility], [address], [place], [place] [address]    Report Status 11/29/2019 FINAL   Surgical pathology     Status: None   Collection Time: 11/29/19 11:11 AM  Result Value Ref Range   SURGICAL PATHOLOGY      SURGICAL PATHOLOGY CASE: [id number] PATIENT: [name] Surgical Pathology Report     Specimen Submitted: A. Stomach polyps, body; cbx  Clinical History: Gastric polyp, dysphagia. Findings: Surgical changes consistent with sleeve gastrectomy, hiatal hernia, mild esophagitis, gastric polyp.     DIAGNOSIS: A. STOMACH POLYPS; COLD BIOPSY: - ANTRAL AND OXYNTIC MUCOSA WITH MODERATE TO MARKED FOVEOLAR HYPERPLASIA, COMPATIBLE WITH GASTRIC HYPERPLASTIC POLYPS. - NEGATIVE FOR H. PYLORI, INTESTINAL METAPLASIA, DYSPLASIA, AND MALIGNANCY.  GROSS DESCRIPTION: A. Labeled: Gastric body polyps cbxs rule out adenoma Received: Formalin Tissue fragment(s): 2 Size: Ranges from 0.5-0.6 cm Description: Tan soft tissue fragments Entirely submitted in 1 cassette.   Final Diagnosis performed by Bryan Lemma, MD.   Electronically signed  11/30/2019 7:23:50PM The electronic signature indicates that the named Attending Pathologist has evaluated the specimen Technical  compone nt performed at [facility], [address] [place], [place] [address] Lab: [phone] Dir: Rush Farmer, MD, MMM  Professional component performed at [facility], [facility], [address], [place], [place] [address] Lab: [phone] Dir: Dellia Nims. Rubinas, MD   Basic metabolic panel     Status: Abnormal   Collection Time: 12/19/19  5:00 PM  Result Value Ref Range   Sodium 136 135 - 145 mmol/L   Potassium 4.7 3.5 - 5.1 mmol/L   Chloride 102 98 - 111 mmol/L   CO2 26 22 - 32 mmol/L   Glucose, Bld 104 (H) 70 - 99 mg/dL    Comment: Glucose reference range applies only to samples taken after fasting for at least 8 hours.   BUN 23 8 - 23 mg/dL   Creatinine, Ser 1.31 (H) 0.44 - 1.00 mg/dL   Calcium 8.7 (L) 8.9 - 10.3 mg/dL   GFR calc non Af Amer 41 (L) >60 mL/min   GFR calc Af Amer 48 (L) >60 mL/min   Anion gap 8 5 - 15    Comment: Performed at [facility], [address] [place], [place] [address]  CBC     Status: Abnormal   Collection Time: 12/19/19  5:00 PM  Result Value Ref Range   WBC 8.3 4.0 - 10.5 K/uL   RBC 3.85 (L) 3.87 - 5.11 MIL/uL   Hemoglobin 9.9 (L) 12.0 - 15.0 g/dL   HCT 32.5 (L) 36 - 46 %   MCV 84.4 80.0 - 100.0 fL   MCH 25.7 (L) 26.0 - 34.0 pg   MCHC 30.5 30.0 - 36.0 g/dL   RDW 15.7 (H) 11.5 - 15.5 %   Platelets 286 150 - 400 K/uL   nRBC 0.0 0.0 - 0.2 %    Comment: Performed at [facility], [address] [place], [place] [address]  Lactic acid, plasma     Status: Abnormal   Collection Time: 12/19/19  5:00 PM  Result Value Ref Range   Lactic Acid, Venous 2.0 (HH) 0.5 - 1.9 mmol/L    Comment: CRITICAL RESULT CALLED TO, READ BACK BY AND VERIFIED WITH STEPHANIE RUDD _0  ON 12/19/19 SKL Performed at [facility], [address] [place], [place] [address]   Hepatic function panel     Status: Abnormal   Collection Time: 12/19/19  9:32 PM  Result Value Ref Range   Total Protein 7.0 6.5 - 8.1 g/dL   Albumin  3.5 3.5 - 5.0 g/dL   AST 14 (L) 15 - 41 U/L   ALT 10 0 - 44 U/L   Alkaline Phosphatase 84 38 - 126 U/L   Total Bilirubin 0.5 0.3 - 1.2 mg/dL   Bilirubin, Direct <0.1 0.0 - 0.2 mg/dL   Indirect Bilirubin NOT CALCULATED 0.3 - 0.9 mg/dL    Comment: Performed at [facility], [address] [place], [place] [address]  CBC with Differential     Status: Abnormal   Collection Time: 12/19/19  9:32 PM  Result Value Ref Range   WBC 7.0 4.0 - 10.5 K/uL   RBC 3.75 (L) 3.87 - 5.11 MIL/uL   Hemoglobin 9.7 (L) 12.0 - 15.0 g/dL   HCT 31.6 (L) 36 - 46 %   MCV 84.3 80.0 - 100.0 fL   MCH 25.9 (L) 26.0 - 34.0 pg   MCHC 30.7 30.0 - 36.0 g/dL  RDW 15.6 (H) 11.5 - 15.5 %   Platelets 302 150 - 400 K/uL   nRBC 0.0 0.0 - 0.2 %   Neutrophils Relative % 68 %   Neutro Abs 4.7 1.7 - 7.7 K/uL   Lymphocytes Relative 20 %   Lymphs Abs 1.4 0.7 - 4.0 K/uL   Monocytes Relative 7 %   Monocytes Absolute 0.5 0.1 - 1.0 K/uL   Eosinophils Relative 4 %   Eosinophils Absolute 0.3 0.0 - 0.5 K/uL   Basophils Relative 1 %   Basophils Absolute 0.1 0.0 - 0.1 K/uL   Immature Granulocytes 0 %   Abs Immature Granulocytes 0.02 0.00 - 0.07 K/uL    Comment: Performed at [facility], [address] [place], [place] [address]  Sedimentation rate     Status: Abnormal   Collection Time: 12/19/19  9:32 PM  Result Value Ref Range   Sed Rate 49 (H) 0 - 30 mm/hr    Comment: Performed at [facility], [address], [place], [place] [address]  Troponin I (High Sensitivity)     Status: None   Collection Time: 12/19/19  9:32 PM  Result Value Ref Range   Troponin I (High Sensitivity) 8 <18 ng/L    Comment: (NOTE) Elevated high sensitivity troponin I (hsTnI) values and significant  changes across serial measurements may suggest ACS but many other  chronic and acute conditions are known to elevate hsTnI results.  Refer to the "Links" section for chest pain algorithms and additional   guidance. Performed at [facility], [address] [place], [place] [address]   Lactic acid, plasma     Status: None   Collection Time: 12/20/19  1:42 AM  Result Value Ref Range   Lactic Acid, Venous 1.1 0.5 - 1.9 mmol/L    Comment: Performed at [facility], [address] [place], [place] [address]  Urinalysis, Complete w Microscopic     Status: Abnormal   Collection Time: 12/20/19  1:42 AM  Result Value Ref Range   Color, Urine YELLOW (A) YELLOW   APPearance CLEAR (A) CLEAR   Specific Gravity, Urine 1.009 1.005 - 1.030   pH 5.0 5.0 - 8.0   Glucose, UA NEGATIVE NEGATIVE mg/dL   Hgb urine dipstick NEGATIVE NEGATIVE   Bilirubin Urine NEGATIVE NEGATIVE   Ketones, ur NEGATIVE NEGATIVE mg/dL   Protein, ur NEGATIVE NEGATIVE mg/dL   Nitrite NEGATIVE NEGATIVE   Leukocytes,Ua NEGATIVE NEGATIVE   RBC / HPF 0-5 0 - 5 RBC/hpf   WBC, UA 0-5 0 - 5 WBC/hpf   Bacteria, UA RARE (A) NONE SEEN   Squamous Epithelial / LPF 0-5 0 - 5   Mucus PRESENT     Comment: Performed at [facility], [address] [place], [place] [address]  Urine culture     Status: Abnormal   Collection Time: 12/20/19  1:42 AM   Specimen: Urine, Random  Result Value Ref Range   Specimen Description      URINE, RANDOM Performed at [facility], [address] [place], [place] [address]    Special Requests      NONE Performed at [facility], [address] [place], [place] [address]    Culture MULTIPLE SPECIES PRESENT, SUGGEST RECOLLECTION (A)    Report Status 12/20/2019 FINAL   Troponin I (High Sensitivity)     Status: None   Collection Time: 12/20/19  1:42 AM  Result Value Ref Range   Troponin I (High  Sensitivity) 7 <18 ng/L    Comment: (NOTE) Elevated high sensitivity troponin I (hsTnI) values and significant  changes across serial measurements may suggest ACS but many other  chronic and acute conditions are known to elevate hsTnI results.  Refer to  the "Links" section for chest pain algorithms and additional  guidance. Performed at [facility], [address] [place], [place] [address]   Creatinine, serum     Status: None   Collection Time: 12/22/19  7:12 AM  Result Value Ref Range   Creatinine, Ser 0.93 0.44 - 1.00 mg/dL   GFR calc non Af Amer >60 >60 mL/min   GFR calc Af Amer >60 >60 mL/min    Comment: Performed at [facility], [address], [place], [place] [address]  Respiratory Panel by RT PCR (Flu A&B, Covid) - Nasopharyngeal Swab     Status: None   Collection Time: 12/23/19  1:13 AM   Specimen: Nasopharyngeal Swab  Result Value Ref Range   SARS Coronavirus 2 by RT PCR NEGATIVE NEGATIVE    Comment: (NOTE) SARS-CoV-2 target nucleic acids are NOT DETECTED.  The SARS-CoV-2 RNA is generally detectable in upper respiratoy specimens during the acute phase of infection. The lowest concentration of SARS-CoV-2 viral copies this assay can detect is 131 copies/mL. A negative result does not preclude SARS-Cov-2 infection and should not be used as the sole basis for treatment or other patient management decisions. A negative result may occur with  improper specimen collection/handling, submission of specimen other than nasopharyngeal swab, presence of viral mutation(s) within the areas targeted by this assay, and inadequate number of viral copies (<131 copies/mL). A negative result must be combined with clinical observations, patient history, and epidemiological information. The expected result is Negative.  Fact Sheet for Patients:  PinkCheek.be  Fact Sheet for Healthcare Providers:  GravelBags.it  This test is no t yet approved or cleared by the [place] FDA and  has been authorized for detection and/or diagnosis of SARS-CoV-2 by FDA under an Emergency Use Authorization (EUA). This EUA will remain  in effect (meaning this test can be  used) for the duration of the COVID-19 declaration under Section 564(b)(1) of the Act, 21 U.S.C. section 360bbb-3(b)(1), unless the authorization is terminated or revoked sooner.     Influenza A by PCR NEGATIVE NEGATIVE   Influenza B by PCR NEGATIVE NEGATIVE    Comment: (NOTE) The Xpert Xpress SARS-CoV-2/FLU/RSV assay is intended as an aid in  the diagnosis of influenza from Nasopharyngeal swab specimens and  should not be used as a sole basis for treatment. Nasal washings and  aspirates are unacceptable for Xpert Xpress SARS-CoV-2/FLU/RSV  testing.  Fact Sheet for Patients: PinkCheek.be  Fact Sheet for Healthcare Providers: GravelBags.it  This test is not yet approved or cleared by the [place] FDA and  has been authorized for detection and/or diagnosis of SARS-CoV-2 by  FDA under an Emergency Use Authorization (EUA). This EUA will remain  in effect (meaning this test can be used) for the duration of the  Covid-19 declaration under Section 564(b)(1) of the Act, 21  U.S.C. section 360bbb-3(b)(1), unless the authorization is  terminated or revoked. Performed at [facility], [address] [place], [place] [address]   Hemoglobin A1c     Status: Abnormal   Collection Time: 12/23/19  6:44 AM  Result Value Ref Range   Hgb A1c MFr Bld 6.9 (H) 4.8 - 5.6 %    Comment: (NOTE) Pre diabetes:  5.7%-6.4%  Diabetes:              >6.4%  Glycemic control for   <7.0% adults with diabetes    Mean Plasma Glucose 151.33 mg/dL    Comment: Performed at [facility] [address], [place], [place] [address]  Lipid panel     Status: None   Collection Time: 12/23/19  6:44 AM  Result Value Ref Range   Cholesterol 122 0 - 200 mg/dL   Triglycerides 77 <150 mg/dL   HDL 63 >40 mg/dL   Total CHOL/HDL Ratio 1.9 RATIO   VLDL 15 0 - 40 mg/dL   LDL Cholesterol 44 0 - 99 mg/dL    Comment:        Total  Cholesterol/HDL:CHD Risk Coronary Heart Disease Risk Table                     Men   Women  1/2 Average Risk   3.4   3.3  Average Risk       5.0   4.4  2 X Average Risk   9.6   7.1  3 X Average Risk  23.4   11.0        Use the calculated Patient Ratio above and the CHD Risk Table to determine the patient's CHD Risk.        ATP III CLASSIFICATION (LDL):  <100     mg/dL   Optimal  100-129  mg/dL   Near or Above                    Optimal  130-159  mg/dL   Borderline  160-189  mg/dL   High  >190     mg/dL   Very High Performed at [facility], [address] [place], [place] [address]   Basic metabolic panel     Status: Abnormal   Collection Time: 12/23/19  6:44 AM  Result Value Ref Range   Sodium 135 135 - 145 mmol/L   Potassium 3.6 3.5 - 5.1 mmol/L   Chloride 99 98 - 111 mmol/L   CO2 25 22 - 32 mmol/L   Glucose, Bld 144 (H) 70 - 99 mg/dL    Comment: Glucose reference range applies only to samples taken after fasting for at least 8 hours.   BUN 16 8 - 23 mg/dL   Creatinine, Ser 0.92 0.44 - 1.00 mg/dL   Calcium 8.3 (L) 8.9 - 10.3 mg/dL   GFR calc non Af Amer >60 >60 mL/min   GFR calc Af Amer >60 >60 mL/min   Anion gap 11 5 - 15    Comment: Performed at [facility], [address] [place], [place] [address]  Protime-INR     Status: Abnormal   Collection Time: 12/23/19  6:44 AM  Result Value Ref Range   Prothrombin Time 16.1 (H) 11.4 - 15.2 seconds   INR 1.3 (H) 0.8 - 1.2    Comment: (NOTE) INR goal varies based on device and disease states. Performed at [facility], [address] [place],  [address]   CBC     Status: Abnormal   Collection Time: 12/23/19  6:44 AM  Result Value Ref Range   WBC 9.2 4.0 - 10.5 K/uL   RBC 3.91 3.87 - 5.11 MIL/uL   Hemoglobin 10.3 (L) 12.0 - 15.0 g/dL   HCT 30.6 (L) 36 - 46 %   MCV 78.3 (L) 80.0 - 100.0  fL   MCH 26.3 26.0 - 34.0 pg   MCHC 33.7 30.0 - 36.0 g/dL   RDW 15.9 (H) 11.5 - 15.5 %    Platelets 308 150 - 400 K/uL   nRBC 0.0 0.0 - 0.2 %    Comment: Performed at [facility], [address] [place], [place] [address]  Basic metabolic panel     Status: Abnormal   Collection Time: 12/24/19  5:34 AM  Result Value Ref Range   Sodium 139 135 - 145 mmol/L   Potassium 4.2 3.5 - 5.1 mmol/L   Chloride 100 98 - 111 mmol/L   CO2 29 22 - 32 mmol/L   Glucose, Bld 179 (H) 70 - 99 mg/dL    Comment: Glucose reference range applies only to samples taken after fasting for at least 8 hours.   BUN 19 8 - 23 mg/dL   Creatinine, Ser 1.06 (H) 0.44 - 1.00 mg/dL   Calcium 8.6 (L) 8.9 - 10.3 mg/dL   GFR calc non Af Amer 53 (L) >60 mL/min   GFR calc Af Amer >60 >60 mL/min   Anion gap 10 5 - 15    Comment: Performed at [facility], [address] [place], [place] [address]  CBC     Status: Abnormal   Collection Time: 12/24/19  5:34 AM  Result Value Ref Range   WBC 8.1 4.0 - 10.5 K/uL   RBC 3.82 (L) 3.87 - 5.11 MIL/uL   Hemoglobin 9.8 (L) 12.0 - 15.0 g/dL   HCT 31.4 (L) 36 - 46 %   MCV 82.2 80.0 - 100.0 fL   MCH 25.7 (L) 26.0 - 34.0 pg   MCHC 31.2 30.0 - 36.0 g/dL   RDW 15.9 (H) 11.5 - 15.5 %   Platelets 292 150 - 400 K/uL   nRBC 0.0 0.0 - 0.2 %    Comment: Performed at [facility], [address] [address], [place], [place] [address]  Magnesium     Status: Abnormal   Collection Time: 12/24/19  5:34 AM  Result Value Ref Range   Magnesium 1.6 (L) 1.7 - 2.4 mg/dL    Comment: Performed at [facility], [address] [place], [place] [address]  Basic metabolic panel     Status: Abnormal   Collection Time: 12/25/19  3:08 AM  Result Value Ref Range   Sodium 138 135 - 145 mmol/L   Potassium 4.6 3.5 - 5.1 mmol/L   Chloride 99 98 - 111 mmol/L   CO2 29 22 - 32 mmol/L   Glucose, Bld 201 (H) 70 - 99 mg/dL    Comment: Glucose reference range applies only to samples taken after fasting for at least 8 hours.   BUN 25 (H) 8 - 23 mg/dL   Creatinine, Ser 1.12  (H) 0.44 - 1.00 mg/dL   Calcium 8.5 (L) 8.9 - 10.3 mg/dL   GFR calc non Af Amer 50 (L) >60 mL/min   GFR calc Af Amer 58 (L) >60 mL/min   Anion gap 10 5 - 15    Comment: Performed at [facility], [address] [place], [place] [address]  CBC     Status: Abnormal   Collection Time: 12/25/19  3:08 AM  Result Value Ref Range   WBC 8.0 4.0 - 10.5 K/uL   RBC 3.62 (L) 3.87 - 5.11 MIL/uL   Hemoglobin 9.5 (L) 12.0 - 15.0 g/dL   HCT 29.4 (L) 36 - 46 %   MCV 81.2  80.0 - 100.0 fL   MCH 26.2 26.0 - 34.0 pg   MCHC 32.3 30.0 - 36.0 g/dL   RDW 15.9 (H) 11.5 - 15.5 %   Platelets 338 150 - 400 K/uL   nRBC 0.0 0.0 - 0.2 %    Comment: Performed at [facility], [address] [place], [place] [address]  Magnesium     Status: None   Collection Time: 12/25/19  3:08 AM  Result Value Ref Range   Magnesium 2.2 1.7 - 2.4 mg/dL    Comment: Performed at [facility], [address] [place], [place] [address]  SARS Coronavirus 2 by RT PCR (hospital order, performed in [facility] hospital lab) Nasopharyngeal Nasopharyngeal Swab     Status: None   Collection Time: 12/27/19  9:50 AM   Specimen: Nasopharyngeal Swab  Result Value Ref Range   SARS Coronavirus 2 NEGATIVE NEGATIVE    Comment: (NOTE) SARS-CoV-2 target nucleic acids are NOT DETECTED.  The SARS-CoV-2 RNA is generally detectable in upper and lower respiratory specimens during the acute phase of infection. The lowest concentration of SARS-CoV-2 viral copies this assay can detect is 250 copies / mL. A negative result does not preclude SARS-CoV-2 infection and should not be used as the sole basis for treatment or other patient management decisions.  A negative result may occur with improper specimen collection / handling, submission of specimen other than nasopharyngeal swab, presence of viral mutation(s) within the areas targeted by this assay, and inadequate number of viral copies (<250 copies / mL). A negative  result must be combined with clinical observations, patient history, and epidemiological information.  Fact Sheet for Patients:   StrictlyIdeas.no  Fact Sheet for Healthcare Providers: BankingDealers.co.za  This test is not yet approved or  cleared by the [place] FDA and has been authorized for detection and/or diagnosis of SARS-CoV-2 by FDA under an Emergency Use Authorization (EUA).  This EUA will remain in effect (meaning this test can be used) for the duration of the COVID-19 declaration under Section 564(b)(1) of the Act, 21 U.S.C. section 360bbb-3(b)(1), unless the authorization is terminated or revoked sooner.  Performed at [facility], [address] [place], [place] [address]   Urinalysis, Complete w Microscopic     Status: Abnormal   Collection Time: 02/04/20  3:37 PM  Result Value Ref Range   Color, Urine YELLOW (A) YELLOW   APPearance CLOUDY (A) CLEAR   Specific Gravity, Urine 1.011 1.005 - 1.030   pH 7.0 5.0 - 8.0   Glucose, UA NEGATIVE NEGATIVE mg/dL   Hgb urine dipstick SMALL (A) NEGATIVE   Bilirubin Urine NEGATIVE NEGATIVE   Ketones, ur NEGATIVE NEGATIVE mg/dL   Protein, ur NEGATIVE NEGATIVE mg/dL   Nitrite NEGATIVE NEGATIVE   Leukocytes,Ua LARGE (A) NEGATIVE   RBC / HPF 0-5 0 - 5 RBC/hpf   WBC, UA >50 (H) 0 - 5 WBC/hpf   Bacteria, UA FEW (A) NONE SEEN   Squamous Epithelial / LPF 0-5 0 - 5    Comment: Performed at [facility], [address], [place], [place] [address]  Basic metabolic panel     Status: Abnormal   Collection Time: 02/04/20  4:45 PM  Result Value Ref Range   Sodium 140 135 - 145 mmol/L   Potassium 4.4 3.5 - 5.1 mmol/L   Chloride 102 98 - 111 mmol/L   CO2 30 22 - 32 mmol/L   Glucose, Bld 143 (H) 70 - 99 mg/dL  Comment: Glucose reference range applies only to samples taken after fasting for at least 8 hours.   BUN 16 8 - 23 mg/dL   Creatinine, Ser 1.01 (H) 0.44 -  1.00 mg/dL   Calcium 8.9 8.9 - 10.3 mg/dL   GFR, Estimated 60 (L) >60 mL/min    Comment: (NOTE) Calculated using the CKD-EPI Creatinine Equation (2021)    Anion gap 8 5 - 15    Comment: Performed at [facility], [address] [place], [place] [address]  Troponin I (High Sensitivity)     Status: None   Collection Time: 02/04/20  4:45 PM  Result Value Ref Range   Troponin I (High Sensitivity) 12 <18 ng/L    Comment: (NOTE) Elevated high sensitivity troponin I (hsTnI) values and significant  changes across serial measurements may suggest ACS but many other  chronic and acute conditions are known to elevate hsTnI results.  Refer to the "Links" section for chest pain algorithms and additional  guidance. Performed at [facility], [address] [place], [place] [address]   CBC with Differential     Status: Abnormal   Collection Time: 02/04/20  4:45 PM  Result Value Ref Range   WBC 7.0 4.0 - 10.5 K/uL   RBC 4.04 3.87 - 5.11 MIL/uL   Hemoglobin 10.4 (L) 12.0 - 15.0 g/dL   HCT 33.2 (L) 36 - 46 %   MCV 82.2 80.0 - 100.0 fL   MCH 25.7 (L) 26.0 - 34.0 pg   MCHC 31.3 30.0 - 36.0 g/dL   RDW 17.2 (H) 11.5 - 15.5 %   Platelets 238 150 - 400 K/uL   nRBC 0.0 0.0 - 0.2 %   Neutrophils Relative % 75 %   Neutro Abs 5.3 1.7 - 7.7 K/uL   Lymphocytes Relative 11 %   Lymphs Abs 0.7 0.7 - 4.0 K/uL   Monocytes Relative 11 %   Monocytes Absolute 0.7 0.1 - 1.0 K/uL   Eosinophils Relative 2 %   Eosinophils Absolute 0.2 0.0 - 0.5 K/uL   Basophils Relative 1 %   Basophils Absolute 0.1 0.0 - 0.1 K/uL   Immature Granulocytes 0 %   Abs Immature Granulocytes 0.02 0.00 - 0.07 K/uL    Comment: Performed at [facility], [address], [place], [place] [address]  Urine culture     Status: Abnormal   Collection Time: 02/05/20  3:41 AM   Specimen: Urine, Random  Result Value Ref Range   Specimen Description      URINE, RANDOM Performed at [facility], [address], [place], [place] [address]    Special Requests      NONE Performed at [facility], [address] [place], [place] [address]    Culture MULTIPLE SPECIES PRESENT, SUGGEST RECOLLECTION (A)    Report Status 02/06/2020 FINAL   Respiratory Panel by RT PCR (Flu A&B, Covid) - Nasopharyngeal Swab     Status: None   Collection Time: 02/05/20  3:41 AM   Specimen: Nasopharyngeal Swab  Result Value Ref Range   SARS Coronavirus 2 by RT PCR NEGATIVE NEGATIVE    Comment: (NOTE) SARS-CoV-2 target nucleic acids are NOT DETECTED.  The SARS-CoV-2 RNA is generally detectable in upper respiratoy specimens during the acute phase of infection. The lowest concentration of SARS-CoV-2 viral copies this assay can detect is 131 copies/mL. A negative result does not preclude SARS-Cov-2 infection and should not be used as the sole basis for treatment or  other patient management decisions. A negative result may occur with  improper specimen collection/handling, submission of specimen other than nasopharyngeal swab, presence of viral mutation(s) within the areas targeted by this assay, and inadequate number of viral copies (<131 copies/mL). A negative result must be combined with clinical observations, patient history, and epidemiological information. The expected result is Negative.  Fact Sheet for Patients:  PinkCheek.be  Fact Sheet for Healthcare Providers:  GravelBags.it  This test is no t yet approved or cleared by the [place] FDA and  has been authorized for detection and/or diagnosis of SARS-CoV-2 by FDA under an Emergency Use Authorization (EUA). This EUA will remain  in effect (meaning this test can be used) for the duration of the COVID-19 declaration under Section 564(b)(1) of the Act, 21 U.S.C. section 360bbb-3(b)(1), unless the authorization is terminated or revoked sooner.     Influenza A by PCR NEGATIVE  NEGATIVE   Influenza B by PCR NEGATIVE NEGATIVE    Comment: (NOTE) The Xpert Xpress SARS-CoV-2/FLU/RSV assay is intended as an aid in  the diagnosis of influenza from Nasopharyngeal swab specimens and  should not be used as a sole basis for treatment. Nasal washings and  aspirates are unacceptable for Xpert Xpress SARS-CoV-2/FLU/RSV  testing.  Fact Sheet for Patients: PinkCheek.be  Fact Sheet for Healthcare Providers: GravelBags.it  This test is not yet approved or cleared by the [place] FDA and  has been authorized for detection and/or diagnosis of SARS-CoV-2 by  FDA under an Emergency Use Authorization (EUA). This EUA will remain  in effect (meaning this test can be used) for the duration of the  Covid-19 declaration under Section 564(b)(1) of the Act, 21  U.S.C. section 360bbb-3(b)(1), unless the authorization is  terminated or revoked. Performed at [facility], [address] [place], [place] [address]   CBG monitoring, ED     Status: Abnormal   Collection Time: 02/05/20 10:09 PM  Result Value Ref Range   Glucose-Capillary 222 (H) 70 - 99 mg/dL    Comment: Glucose reference range applies only to samples taken after fasting for at least 8 hours.  Glucose, capillary     Status: Abnormal   Collection Time: 02/05/20 11:50 PM  Result Value Ref Range   Glucose-Capillary 193 (H) 70 - 99 mg/dL    Comment: Glucose reference range applies only to samples taken after fasting for at least 8 hours.  Glucose, capillary     Status: None   Collection Time: 02/06/20  4:41 AM  Result Value Ref Range   Glucose-Capillary 83 70 - 99 mg/dL    Comment: Glucose reference range applies only to samples taken after fasting for at least 8 hours.  Vitamin B12     Status: Abnormal   Collection Time: 02/06/20  4:52 AM  Result Value Ref Range   Vitamin B-12 126 (L) 180 - 914 pg/mL    Comment: (NOTE) This assay is not  validated for testing neonatal or myeloproliferative syndrome specimens for Vitamin B12 levels. Performed at [facility], [place] [address] [place], [place] [address]   Lipid panel     Status: None   Collection Time: 02/06/20  4:53 AM  Result Value Ref Range   Cholesterol 139 0 - 200 mg/dL   Triglycerides 52 <150 mg/dL   HDL 61 >40 mg/dL   Total CHOL/HDL Ratio 2.3 RATIO   VLDL 10 0 - 40 mg/dL   LDL Cholesterol 68 0 - 99 mg/dL  Comment:        Total Cholesterol/HDL:CHD Risk Coronary Heart Disease Risk Table                     Men   Women  1/2 Average Risk   3.4   3.3  Average Risk       5.0   4.4  2 X Average Risk   9.6   7.1  3 X Average Risk  23.4   11.0        Use the calculated Patient Ratio above and the CHD Risk Table to determine the patient's CHD Risk.        ATP III CLASSIFICATION (LDL):  <100     mg/dL   Optimal  100-129  mg/dL   Near or Above                    Optimal  130-159  mg/dL   Borderline  160-189  mg/dL   High  >190     mg/dL   Very High Performed at [facility], [address] [place], [place] [address]   Hemoglobin A1c     Status: Abnormal   Collection Time: 02/06/20  4:53 AM  Result Value Ref Range   Hgb A1c MFr Bld 7.1 (H) 4.8 - 5.6 %    Comment: (NOTE) Pre diabetes:          5.7%-6.4%  Diabetes:              >6.4%  Glycemic control for   <7.0% adults with diabetes    Mean Plasma Glucose 157.07 mg/dL    Comment: Performed at [place] [address], [place], [place] [address]  Glucose, capillary     Status: Abnormal   Collection Time: 02/06/20  7:45 AM  Result Value Ref Range   Glucose-Capillary 109 (H) 70 - 99 mg/dL    Comment: Glucose reference range applies only to samples taken after fasting for at least 8 hours.  Glucose, capillary     Status: Abnormal   Collection Time: 02/06/20 11:22 AM  Result Value Ref Range   Glucose-Capillary 194 (H) 70 - 99 mg/dL    Comment: Glucose reference range applies  only to samples taken after fasting for at least 8 hours.  TSH     Status: None   Collection Time: 02/06/20  2:31 PM  Result Value Ref Range   TSH 0.939 0.350 - 4.500 uIU/mL    Comment: Performed by a 3rd Generation assay with a functional sensitivity of <=0.01 uIU/mL. Performed at [facility], [address] [place], [place] [address]   RPR     Status: None   Collection Time: 02/06/20  2:31 PM  Result Value Ref Range   RPR Ser Ql NON REACTIVE NON REACTIVE    Comment: Performed at [place] [address] [place], [place] [address]  HIV Antibody (routine testing w rflx)     Status: None   Collection Time: 02/06/20  2:31 PM  Result Value Ref Range   HIV Screen 4th Generation wRfx Non Reactive Non Reactive    Comment: (NOTE) Performed At: [facility] [address] [place], [place] [id number] Rush Farmer MD MV:[id number]   Glucose, capillary     Status: Abnormal   Collection Time: 02/06/20  4:05 PM  Result Value Ref Range   Glucose-Capillary 173 (H) 70 - 99 mg/dL    Comment: Glucose reference range  applies only to samples taken after fasting for at least 8 hours.  Glucose, capillary     Status: Abnormal   Collection Time: 02/06/20  8:27 PM  Result Value Ref Range   Glucose-Capillary 165 (H) 70 - 99 mg/dL    Comment: Glucose reference range applies only to samples taken after fasting for at least 8 hours.  CBC with Differential/Platelet     Status: Abnormal   Collection Time: 02/07/20  4:45 AM  Result Value Ref Range   WBC 4.1 4.0 - 10.5 K/uL   RBC 3.69 (L) 3.87 - 5.11 MIL/uL   Hemoglobin 9.6 (L) 12.0 - 15.0 g/dL   HCT 30.5 (L) 36 - 46 %   MCV 82.7 80.0 - 100.0 fL   MCH 26.0 26.0 - 34.0 pg   MCHC 31.5 30.0 - 36.0 g/dL   RDW 17.0 (H) 11.5 - 15.5 %   Platelets 256 150 - 400 K/uL   nRBC 0.0 0.0 - 0.2 %   Neutrophils Relative % 56 %   Neutro Abs 2.3 1.7 - 7.7 K/uL   Lymphocytes Relative 26 %   Lymphs Abs 1.1 0.7 - 4.0 K/uL   Monocytes  Relative 10 %   Monocytes Absolute 0.4 0.1 - 1.0 K/uL   Eosinophils Relative 6 %   Eosinophils Absolute 0.2 0.0 - 0.5 K/uL   Basophils Relative 2 %   Basophils Absolute 0.1 0.0 - 0.1 K/uL   Immature Granulocytes 0 %   Abs Immature Granulocytes 0.01 0.00 - 0.07 K/uL    Comment: Performed at [facility], [address] [place], [place] [address]  Basic metabolic panel     Status: Abnormal   Collection Time: 02/07/20  4:45 AM  Result Value Ref Range   Sodium 139 135 - 145 mmol/L   Potassium 4.7 3.5 - 5.1 mmol/L   Chloride 101 98 - 111 mmol/L   CO2 28 22 - 32 mmol/L   Glucose, Bld 144 (H) 70 - 99 mg/dL    Comment: Glucose reference range applies only to samples taken after fasting for at least 8 hours.   BUN 18 8 - 23 mg/dL   Creatinine, Ser 0.96 0.44 - 1.00 mg/dL   Calcium 8.9 8.9 - 10.3 mg/dL   GFR, Estimated >60 >60 mL/min    Comment: (NOTE) Calculated using the CKD-EPI Creatinine Equation (2021)    Anion gap 10 5 - 15    Comment: Performed at [facility], [address] [place], [place] [address]  ESR     Status: Abnormal   Collection Time: 02/07/20  4:45 AM  Result Value Ref Range   Sed Rate 60 (H) 0 - 30 mm/hr    Comment: Performed at [facility], [address] [place], [place] [address]  Folate, serum, performed at [facility] lab     Status: Abnormal   Collection Time: 02/07/20  4:45 AM  Result Value Ref Range   Folate 4.2 (L) >5.9 ng/mL    Comment: Performed at [facility], [address] [place], [place] [address]  C-reactive protein     Status: Abnormal   Collection Time: 02/07/20  4:46 AM  Result Value Ref Range   CRP 7.0 (H) <1.0 mg/dL    Comment: Performed at [place] [address], [place], [place] [address]  Ammonia     Status: Abnormal   Collection Time: 02/07/20  4:46 AM  Result Value Ref Range   Ammonia <9 (L)  9 - 35 umol/L    Comment: Performed at [facility], [address]  [place], [place] [address]  Glucose, capillary     Status: Abnormal   Collection Time: 02/07/20  7:30 AM  Result Value Ref Range   Glucose-Capillary 117 (H) 70 - 99 mg/dL    Comment: Glucose reference range applies only to samples taken after fasting for at least 8 hours.  Glucose, capillary     Status: Abnormal   Collection Time: 02/07/20 11:54 AM  Result Value Ref Range   Glucose-Capillary 149 (H) 70 - 99 mg/dL    Comment: Glucose reference range applies only to samples taken after fasting for at least 8 hours.    Assessment/Plan:  Nonhealing ulceration left foot/heel area.  Podiatry is following.  She has previously had normal perfusion but it has been a while since this has been checked.  No angiogram at current with other ongoing issues including stroke.  Would recommend an outpatient follow-up in our office in 1 to 2 weeks with ABIs. Recent stroke.  Carotid duplex showed very mild carotid disease bilaterally so this is less likely to be the cause of her stroke. Diabetes.  Stable on outpatient medications and blood glucose control important in reducing the progression of atherosclerotic disease. Also, involved in wound healing. On appropriate medications.      Leotis Pain 02/07/2020, 1:56 PM   This note was created with Dragon medical transcription system.  Any errors from dictation are unintentional.

## 2020-02-07 NOTE — Telephone Encounter (Signed)
Faxed to [facility] for plan of care for certification period 01-30-2020 to 01-27-2021

## 2020-02-07 NOTE — Telephone Encounter (Signed)
Faxed plan of care of for occupational therapist therapist evalation faxed on 02-05-2020

## 2020-02-08 DIAGNOSIS — E1151 Type 2 diabetes mellitus with diabetic peripheral angiopathy without gangrene: Secondary | ICD-10-CM | POA: Diagnosis not present

## 2020-02-08 DIAGNOSIS — I6340 Cerebral infarction due to embolism of unspecified cerebral artery: Secondary | ICD-10-CM | POA: Diagnosis not present

## 2020-02-08 DIAGNOSIS — E559 Vitamin D deficiency, unspecified: Secondary | ICD-10-CM | POA: Diagnosis present

## 2020-02-08 DIAGNOSIS — I480 Paroxysmal atrial fibrillation: Secondary | ICD-10-CM | POA: Diagnosis not present

## 2020-02-08 DIAGNOSIS — G894 Chronic pain syndrome: Secondary | ICD-10-CM | POA: Diagnosis not present

## 2020-02-08 DIAGNOSIS — I639 Cerebral infarction, unspecified: Secondary | ICD-10-CM | POA: Diagnosis not present

## 2020-02-08 DIAGNOSIS — E440 Moderate protein-calorie malnutrition: Secondary | ICD-10-CM | POA: Diagnosis not present

## 2020-02-08 DIAGNOSIS — E538 Deficiency of other specified B group vitamins: Secondary | ICD-10-CM | POA: Diagnosis present

## 2020-02-08 LAB — IRON AND TIBC
Iron: 34 ug/dL (ref 28–170)
Saturation Ratios: 8 % — ABNORMAL LOW (ref 10.4–31.8)
TIBC: 409 ug/dL (ref 250–450)
UIBC: 375 ug/dL

## 2020-02-08 LAB — FOLATE RBC
Folate, Hemolysate: 319 ng/mL
Folate, RBC: 1022 ng/mL (ref >498)
Hematocrit: 31.2 % — ABNORMAL LOW (ref 34.0–46.6)

## 2020-02-08 LAB — GLUCOSE, CAPILLARY
Glucose-Capillary: 113 mg/dL — ABNORMAL HIGH (ref 70–99)
Glucose-Capillary: 195 mg/dL — ABNORMAL HIGH (ref 70–99)
Glucose-Capillary: 130 mg/dL — ABNORMAL HIGH (ref 70–99)
Glucose-Capillary: 149 mg/dL — ABNORMAL HIGH (ref 70–99)

## 2020-02-08 LAB — CBC
HCT: 33.8 % — ABNORMAL LOW (ref 36.0–46.0)
Hemoglobin: 10.5 g/dL — ABNORMAL LOW (ref 12.0–15.0)
MCH: 25.8 pg — ABNORMAL LOW (ref 26.0–34.0)
MCHC: 31.1 g/dL (ref 30.0–36.0)
MCV: 83 fL (ref 80.0–100.0)
Platelets: 268 10*3/uL (ref 150–400)
RBC: 4.07 MIL/uL (ref 3.87–5.11)
RDW: 16.5 % — ABNORMAL HIGH (ref 11.5–15.5)
WBC: 5.2 10*3/uL (ref 4.0–10.5)
nRBC: 0 % (ref 0.0–0.2)

## 2020-02-08 LAB — HOMOCYSTEINE: Homocysteine: 17.2 umol/L (ref 0.0–17.2)

## 2020-02-08 LAB — FERRITIN: Ferritin: 29 ng/mL (ref 11–307)

## 2020-02-08 MED ORDER — SODIUM CHLORIDE 0.9 % IV SOLN
300.0000 mg | Freq: Once | INTRAVENOUS | Status: AC
  Administered 2020-02-08: 300 mg via INTRAVENOUS
  Filled 2020-02-08: qty 15

## 2020-02-08 NOTE — Progress Notes (Signed)
Physical Therapy Treatment Patient Details Name: [name] MRN: [id number] DOB: April 21, 1950 Today's Date: 02/08/2020    History of Present Illness [name] is a 60yoF who comes to [facility] on 11/14 c Lt shoulder pain, bilat pedal pain, bladder spasm. Pt was recently at [facility], has been home for 9 days, initially getting around well, AMB in house with RW. Per RN, pt has not been taking lyrica, which pt says is for her neuropathy, formerly seeing pain management. PMH includes chronic left achilles wound followed by podiatry, chronic Left diabetic heel ulcer followed by podiatry, PVD, chronic left shoulder DJD/pain; Rt shoulder OA s/p surgery. Pt went to MRI late 11/15 found to have acute frontal lobe infarcts.    PT Comments    Pt resting in bed upon PT arrival; husband present who had brought in pt's shoes from home (2 pairs of wedge shoes that pt reports her podiatrist liked and one that she wore last time she was in rehab).  D/t pt's L foot being bandaged, pt's L foot did not fit into either pair of wedge shoes (backless) and 3rd pair of shoes pt reports having issues with in the past so deferred trial of that pair of shoes.  Pt with boot in room but pt reports use of that may have caused some skin issues so deferred trial of boot.  Pt also with post op shoe in room and pt requesting to trial this shoe on L foot d/t having a lot of padding within L foot bandage protecting L foot.  No pain at rest.  Pt required mod assist for trunk semi-supine to sitting edge of bed (although pt not using L UE to sit up pt still c/o significant L UE pain with this transition).  Attempted to stand from elevated bed multiple times but unable d/t pt's reports of significant L UE pain (therapist cued pt to not use L UE d/t reports of significant L UE pain at rest sitting edge of bed and without any use, but even with max cues, pt still trying to use L UE to stand).  Pt reporting no pain in B feet with  any activity today.  Pt assisted back to bed end of session and repositioned for comfort.  Nurse notified of pt's request for pain meds.  Will continue to focus on strengthening and progressive functional mobility per pt tolerance.   Follow Up Recommendations  SNF     Equipment Recommendations  None recommended by PT    Recommendations for Other Services OT consult     Precautions / Restrictions Precautions Precautions: Fall Restrictions Weight Bearing Restrictions: No Other Position/Activity Restrictions: Per Podiatry note: "Patient may be weightbearing as tolerated to the left foot but needs to be wearing something to protect the posterior heel area from rubbing against shoe or boot and needs pressure relief on the plantar heel.  Recommended to physical therapy wearing a wedge shoe for forefoot ambulation."    Mobility  Bed Mobility Overal bed mobility: Needs Assistance Bed Mobility: Supine to Sit;Sit to Supine     Supine to sit: Mod assist Sit to supine: Mod assist   General bed mobility comments: assist for trunk semi-supine to sitting edge of bed; assist for trunk and minimal assist for LE's sit to semi-supine; 2 assist to boost pt up in bed via bed sheets end of session  Transfers Overall transfer level: Needs assistance Equipment used: Rolling walker (2 wheeled) Transfers: Sit to/from Stand Sit to  Stand: Total assist;From elevated surface         General transfer comment: x5 attempts to stand from elevated bed but unable to stand (or clear pt's bottom from bed) d/t pt's reports of significant L upper arm/shoulder pain  Ambulation/Gait             General Gait Details: unable to stand to attempt   Stairs             Wheelchair Mobility    Modified Rankin (Stroke Patients Only)       Balance Overall balance assessment: Needs assistance Sitting-balance support: No upper extremity supported;Feet supported Sitting balance-Leahy Scale:  Good Sitting balance - Comments: steady sitting reaching within BOS with R UE                                    Cognition Arousal/Alertness: Awake/alert Behavior During Therapy: WFL for tasks assessed/performed Overall Cognitive Status: Within Functional Limits for tasks assessed                                        Exercises      General Comments  Pt agreeable to PT session.      Pertinent Vitals/Pain      Home Living                      Prior Function            PT Goals (current goals can now be found in the care plan section) Acute Rehab PT Goals Patient Stated Goal: improve pain in L UE to tolerate transfers PT Goal Formulation: With patient Time For Goal Achievement: 02/19/20 Potential to Achieve Goals: Poor Progress towards PT goals: Progressing toward goals    Frequency    7X/week      PT Plan Current plan remains appropriate;Frequency needs to be updated;Discharge plan needs to be updated    Co-evaluation              AM-PAC PT "6 Clicks" Mobility   Outcome Measure  Help needed turning from your back to your side while in a flat bed without using bedrails?: A Lot Help needed moving from lying on your back to sitting on the side of a flat bed without using bedrails?: A Lot Help needed moving to and from a bed to a chair (including a wheelchair)?: Total Help needed standing up from a chair using your arms (e.g., wheelchair or bedside chair)?: Total Help needed to walk in hospital room?: Total Help needed climbing 3-5 steps with a railing? : Total 6 Click Score: 8    End of Session Equipment Utilized During Treatment: Gait belt Activity Tolerance: Patient limited by pain Patient left: in bed;with call bell/phone within reach;with bed alarm set;with family/visitor present;Other (comment) (B heels floating via pillow support) Nurse Communication: Mobility status;Precautions;Patient requests pain  meds PT Visit Diagnosis: Difficulty in walking, not elsewhere classified (R26.2);Muscle weakness (generalized) (M62.81)     Time: 9147-8295 PT Time Calculation (min) (ACUTE ONLY): 53 min  Charges:  $Therapeutic Exercise: 8-22 mins $Therapeutic Activity: 38-52 mins                     Leitha Bleak, PT 02/08/20, 9:57 PM

## 2020-02-08 NOTE — Care Management Important Message (Signed)
Important Message  Patient Details  Name: [name] MRN: [id number] Date of Birth: March 24, 1950   Medicare Important Message Given:  Yes     [name] 02/08/2020, 1:31 PM

## 2020-02-08 NOTE — TOC Progression Note (Signed)
Transition of Care [facility]) - Progression Note    Patient Details  Name: [name] MRN: [id number] Date of Birth: 1950/04/25  Transition of Care [facility]) CM/SW Contact  Shelbie Hutching, RN Phone Number: 02/08/2020, 11:00 AM  Clinical Narrative:    Patient's husband still has not been able to give a definitve answer on patient going to [facility].  He states he is on the fence because he doesn't have the money for the copay but he also can't take her home because he can't lift her and he is sure she will fall and end up right back here at the hospital.    Expected Discharge Plan: Skilled Nursing Facility Barriers to Discharge: Continued Medical Work up, Ship broker  Expected Discharge Plan and Services Expected Discharge Plan: [place]   Discharge Planning Services: CM Consult Post Acute Care Choice: [place] Living arrangements for the past 2 months: Single Family Home                                       Social Determinants of Health (SDOH) Interventions    Readmission Risk Interventions Readmission Risk Prevention Plan 07/30/2018  Transportation Screening Complete  PCP or Specialist Appt within 3-5 Days Complete  HRI or Home Care Consult Complete  Social Work Consult for [place] Planning/Counseling Patient refused  Palliative Care Screening Patient Refused  Medication Review Press photographer) Complete  Some recent data might be hidden

## 2020-02-08 NOTE — Progress Notes (Signed)
Neurology Progress Note  Patient ID: [name] is a 69 y.o. with PMHx of  has a past medical history of Abnormal antibody titer, Anxiety and depression, Arthritis, Asthma, Cystocele, Depression, Diabetes ([place]), DVT (deep venous thrombosis) ([place]), Dyspnea, Dysrhythmia, Edema, GERD (gastroesophageal reflux disease), Heart murmur, History of IBS, History of kidney stones, History of methicillin resistant staphylococcus aureus (MRSA) (2008), Hyperlipidemia, Hypertension, Hypothyroidism, Insomnia, Kidney stone, Neuropathy involving both lower extremities, Obesity, Class II, BMI 35-39.9, with comorbidity, Paroxysmal atrial fibrillation ([place]) (2007), Peripheral vascular disease ([place]), Sleep apnea, Urinary incontinence, and Venous stasis dermatitis of both lower extremities.  Initially consulted for: Recurrent strokes   Subjective: - Reports pain continuing to improve - Reports she knows about adrenal masses and will follow-up with PCP  - Denies restless feeling at night or nocturnal worsening of her symptoms   Exam: Vitals:   02/08/20 0418 02/08/20 0807  BP: 136/60 (!) 149/75  Pulse: (!) 54 (!) 59  Resp: 16 20  Temp: 97.8 F (36.6 C) 97.6 F (36.4 C)  SpO2: 100% 100%   Gen: In bed, comfortable  Resp: non-labored breathing, no grossly audible wheezing Cardiac: Perfusing extremities well  Abd: soft, nt  Neuro: MS: Alert, good recall of prior day's conversation, able to name days of the week backwards quickly and correctly CN: Face symmetric, tracks examiner well  Motor/sensory: LUE continues to be pain limited, freely moves RUE, mild upwards drift of RUE, 5/5 R hip flexion, 4/5 left hip flexion. Pain in feet is improving, can provide 4/5 plantar flexion at the balls of the feet bilaterally  Pertinent Labs:  TSH 0.939 ESR 60 (increased from 49 in 12/19/2019) CRP 7 (ref range < 1)  RPR (-) HIV (-)  B12 126 B6 pending B1 level pending Vitamin D pending Copper level  pending Heavy metal screen pending Ammonia < 9 B3 pending B9 (Folate) 4.2 (ref range > 5.9)   Iron panel c/w IDA  Ref. Range 02/08/2020 05:57  Iron Latest Ref Range: 28 - 170 ug/dL 34  UIBC Latest Units: ug/dL 375  TIBC Latest Ref Range: 250 - 450 ug/dL 409  Saturation Ratios Latest Ref Range: 10.4 - 31.8 % 8 (L)  Ferritin Latest Ref Range: 11 - 307 ng/mL 29    Lab Results  Component Value Date   HGBA1C 7.1 (H) 02/06/2020         Lab Results  Component Value Date   CHOL 139 02/06/2020   HDL 61 02/06/2020   LDLCALC 68 02/06/2020   TRIG 52 02/06/2020   CHOLHDL 2.3 02/06/2020    CT C/A/P w/ contrast personally reviewed. Bilateral adrenal masses as described by radiology:   On chart review these have been present and gradually enlarging since 2014  Impression: This is a 69 year old woman with multiple vascular risk factors as detailed previously.  Lab work today does reveals multiple nutritional deficiencies, including B12 and folate, as well as an iron panel consistent with iron deficiency anemia.  Iron deficiency anemia is known to exacerbate symptoms of restless leg syndrome which could also be contributing to her painful paresthesias though she denies symptoms at this time aggressive supplementation may help prevent her re-admission in the future.  Malignancy screening has revealed bilateral adrenal masses, which I wonder if they have contributed to her cushingoid body habitus, nausea etc.; I defer work-up and management of this to primary team / PCP.  Recommendations: - Further work-up and management of adrenal masses per primary team / PCP - Consider IV  iron supplementation, dosing per primary team °- Continued counseling on vitamin compliance °- No further inpatient neurological work-up °- Outpatient neurology follow-up for neuropathy and strokes ° °  MD-PhD °[facility] °[phone]  ° ° °

## 2020-02-08 NOTE — Progress Notes (Addendum)
PROGRESS NOTE    [name]   WNU:[id number]  DOB: 06/11/1950  PCP: [name], [name], MD    DOA: 02/04/2020 LOS: 3   Brief Narrative   HPI from Dr Cherrie Distance is a 69 y.o. adult with medical history significant of recent admission for acute stroke, paroxysmal atrial fibrillation on Eliquis, DM2, morbid obesity, chronic pain syndrome, previous DVT, sleep apnea on CPAP, presented with increased confusion, worsening left-sided weakness, falls from home on 02/04/2020. Patient was also endorsing increased pain throughout in her left shoulder and feet. Reported bladder spasms, bladder incontinence, family initially was suspected UTI, but later reported that when patient had similar presentation, she was found to have a CVA. Last admit was a month ago for stroke of which her Pradaxa was switched to Eliquis. Pt has ASA allergy.  Patient was discharged to SNF and eventually discharged home.  In the ED, MRI showed new CVA, patient also managed for possible UTI.  Patient admitted for further management.     Assessment & Plan   Principal Problem:   Stroke [facility]) Active Problems:   Recurrent falls   Acute embolic stroke (HCC)   Folate deficiency   Vitamin D deficiency   Paroxysmal atrial fibrillation (HCC); CHA2DS2-Vasc Score 5. On Pradaxa   Essential hypertension   DM (diabetes mellitus) type II, controlled, with peripheral vascular disorder ([place])   Hypertension associated with diabetes ([place])   Malnutrition of moderate degree   Sleep apnea   Hypothyroidism, unspecified   Gastro-esophageal reflux disease without esophagitis   Mass of both adrenal glands ([place])   Morbid obesity with BMI of 40.0-44.9, adult (HCC)   Chronic pain syndrome  Recurrent stroke / Acute cortical infarct of posterior left frontal lobe - POA, with recent stroke in Sept this year.  Pt had just returned home from rehab about 8-10 days prior to this admission.  Has been compliance with  anticoagulation for A-fib.   Echo in 6/21 showed EF of 60 to 65%, no regional wall motion abnormality, no atrial level shunt detected by color-flow Doppler. LDL at goal 68.  Hbg A1c is 7.1% --Neurology following, suspects recurrent strokes due to her malnutrition given hx of gastric sleeve and pt not taking vitamin supplements as prescribed (vit B12 deficiency can lead to hypercoagulable state).    --CT chest/abd/pelvis 11/17 negative for malignancies (did show chronic and stable adrenal masses) --Continue Eliquis --PT/OT --Neuro checks --Telemetry monitoring --Follow up pending labs (homocytsteine etc.) --Strict compliance with vitamin supplements --IV iron today as below  Right shoulder pain - OT evaluation.  Voltaren gel.  Currently limited patient's ability to use walker.    Abnormal UA - on admission UA showed large leukocytes, few bacteria, greater than 50 WBC. Cultlure grew multiple species, likely contamination.  Pt asymptomatic.  She was given fosfomycin on 02/05/20.   Monitor.  Hypertension - chronic, stable.  Meds were held for permissive HTN.  Resume amlodipine.  Monitor BP and resume Hyzaar when indicated.    Paroxysmal A-fib - rate controlled.  Continue Eliquis and metoprolol.  Type 2 Diabetes with Polyneuropathy - Hbg A1c is 7.1%, poorly controlled.  Hold metformin and Januvia.  Sliding scale Novolog. Continue Lyrica and B12 supplementation for neuropathy.  Will give IV iron infusion as well since low end of normal and this could contribute to paresthesias.  Unsteady Gait - with intolerance for weight-bearing on her feet.  This seems primarily due severe neuropathy.  Continue Lyrica, B12 supplementation as below.  PT/OT to follow.    Vitamin B12 deficiency - likely contributes to neuropathy, secondary to malnutrition s/p gastric sleeve and noncompliance with vitamin supplements.  Continue IM B12 injections daily x 7 days, then PO replacement. --Follow up pending labs:  thiamine, B6, heavy metals screen.   --Consider outpatient EMC/NCS if persistent symptoms after B12 repletion.  Folic acid deficiency - continue oral supplement.  Vitamin D deficiency - continue oral supplement.  Nonhealing Left Heel Ulcer - follows with podiatry, they are consulted.  Vascular was consulted at request of podiatry.  Recommend outpatient follow up in 1-2 weeks for repeat ABI's (this and prior angiogram in 2019 showed good flow).   --Monitor closely for s/sx's of infection --wound care, daily dressing changes --off-load heels --wt bearing as tolerated on LLE, with wedge shoe recommended for forefoot ambulation  Hypothyroidism - continue Synthroid  Obstructive Sleep Apnea - CPAP ordered  Normocytic anemia - is likely multifactorial with B12 deficiency contributing.  Iron studies with AM labs.  Depression / Anxiety - continue Wellbutrin, Cymbalta PRN Xanax   Obesity: Body mass index is 39.99 kg/m.   Complicates overall care and prognosis.  Recommend lifestyle modifications.    DVT prophylaxis: SCD's Start: 02/06/20 0130 apixaban (ELIQUIS) tablet 5 mg   Diet:  Diet Orders (From admission, onward)    Start     Ordered   02/06/20 9163  Diet heart healthy/carb modified Room service appropriate? Yes; Fluid consistency: Thin  Diet effective now       Question Answer Comment  Diet-HS Snack? Nothing   Room service appropriate? Yes   Fluid consistency: Thin      02/06/20 0922            Code Status: Full Code    Subjective 02/08/20    Pt seen this Am.  She reports being quite down about her current functional limitations and the plans she'd had for her retirement.  Worries a great deal about having more strokes.  This afternoon, returned to bedside with husband visiting.  Patient reports her feet felt good when standing up with PT today, however her left shoulder pain did not allow her to use the walker.  OT has not been able to evaluate yet.  Shoulder pain has been  issue since first strokes in Sept, but was not really addressed at rehab, per patient and husband.  Pt states Voltaren gel helps shoulder pain.   Disposition Plan & Communication   Status is: Inpatient  Remains inpatient appropriate because: ongoing / pending evaluations by PT and OT.  Will d/c either home health or back to SNF if needed.  Received IV medications as above.   Dispo: The patient is from: Home              Anticipated d/c is to: SNF              Anticipated d/c date is: 1 day              Patient currently is not medically stable to d/c.   Family Communication: husband at bedside on 2nd visit this afternoon   Consults, Procedures, Significant Events   Consultants:   Neurology  Procedures:   None  Antimicrobials:  Anti-infectives (From admission, onward)   Start     Dose/Rate Route Frequency Ordered Stop   02/05/20 0000  methenamine (MANDELAMINE) tablet 1,000 mg        1,000 mg Oral 2 times daily 02/04/20 2350     02/04/20  2215  fosfomycin (MONUROL) packet 3 g        3 g Oral STAT 02/04/20 2210 02/04/20 2224   02/04/20 2100  sulfamethoxazole-trimethoprim (BACTRIM DS) 800-160 MG per tablet 1 tablet  Status:  Discontinued        1 tablet Oral 2 times daily 02/04/20 2047 02/04/20 2210         Objective   Vitals:   02/07/20 1932 02/08/20 0041 02/08/20 0418 02/08/20 0807  BP: 133/68 138/66 136/60 (!) 149/75  Pulse: (!) 57 (!) 55 (!) 54 (!) 59  Resp: 16 15 16 20   Temp: 98 F (36.7 C) 97.6 F (36.4 C) 97.8 F (36.6 C) 97.6 F (36.4 C)  TempSrc: Oral Oral  Oral  SpO2: 99% 98% 100% 100%  Weight:      Height:        Intake/Output Summary (Last 24 hours) at 02/08/2020 1654 Last data filed at 02/08/2020 1323 Gross per 24 hour  Intake 265 ml  Output 2300 ml  Net -2035 ml   Filed Weights   02/04/20 1428  Weight: 109 kg    Physical Exam:  General exam: awake, alert, no acute distress, obese Respiratory system: CTAB, no wheezes, rales or  rhonchi, normal respiratory effort. Cardiovascular system: normal S1/S2, no pedal edema.   Gastrointestinal system: soft, NT, ND, +bowel sounds. Central nervous system: A&O x4. normal speech, stable mild RUE weakness Extremities: left posterior heel dressing in place, offloading pads in place, moves all Psychiatry: depressed mood, congruent affect, judgement and insight appear normal  Labs   Data Reviewed: I have personally reviewed following labs and imaging studies  CBC: Recent Labs  Lab 02/04/20 1645 02/07/20 0445 02/08/20 0557  WBC 7.0 4.1 5.2  NEUTROABS 5.3 2.3  --   HGB 10.4* 9.6* 10.5*  HCT 33.2* 30.5*  31.2* 33.8*  MCV 82.2 82.7 83.0  PLT 238 256 875   Basic Metabolic Panel: Recent Labs  Lab 02/04/20 1645 02/07/20 0445  NA 140 139  K 4.4 4.7  CL 102 101  CO2 30 28  GLUCOSE 143* 144*  BUN 16 18  CREATININE 1.01* 0.96  CALCIUM 8.9 8.9   GFR: Estimated Creatinine Clearance (by C-G formula based on SCr of 0.96 mg/dL) Female: 67 mL/min Female: 81.5 mL/min Liver Function Tests: No results for input(s): AST, ALT, ALKPHOS, BILITOT, PROT, ALBUMIN in the last 168 hours. No results for input(s): LIPASE, AMYLASE in the last 168 hours. Recent Labs  Lab 02/07/20 0446  AMMONIA <9*   Coagulation Profile: No results for input(s): INR, PROTIME in the last 168 hours. Cardiac Enzymes: No results for input(s): CKTOTAL, CKMB, CKMBINDEX, TROPONINI in the last 168 hours. BNP (last 3 results) No results for input(s): PROBNP in the last 8760 hours. HbA1C: Recent Labs    02/06/20 0453  HGBA1C 7.1*   CBG: Recent Labs  Lab 02/07/20 1517 02/07/20 2003 02/08/20 0814 02/08/20 1133 02/08/20 1551  GLUCAP 179* 190* 113* 195* 130*   Lipid Profile: Recent Labs    02/06/20 0453  CHOL 139  HDL 61  LDLCALC 68  TRIG 52  CHOLHDL 2.3   Thyroid Function Tests: Recent Labs    02/06/20 1431  TSH 0.939   Anemia Panel: Recent Labs    02/06/20 0452 02/07/20 0445  02/08/20 0557  VITAMINB12 126*  --   --   FOLATE  --  4.2*  --   FERRITIN  --   --  29  TIBC  --   --  409  IRON  --   --  34   Sepsis Labs: No results for input(s): PROCALCITON, LATICACIDVEN in the last 168 hours.  Recent Results (from the past 240 hour(s))  Urine culture     Status: Abnormal   Collection Time: 02/05/20  3:41 AM   Specimen: Urine, Random  Result Value Ref Range Status   Specimen Description   Final    URINE, RANDOM Performed at [facility], [address], [place], [place] [address]    Special Requests   Final    NONE Performed at [facility], [address] [place], [place] [address]    Culture MULTIPLE SPECIES PRESENT, SUGGEST RECOLLECTION (A)  Final   Report Status 02/06/2020 FINAL  Final  Respiratory Panel by RT PCR (Flu A&B, Covid) - Nasopharyngeal Swab     Status: None   Collection Time: 02/05/20  3:41 AM   Specimen: Nasopharyngeal Swab  Result Value Ref Range Status   SARS Coronavirus 2 by RT PCR NEGATIVE NEGATIVE Final    Comment: (NOTE) SARS-CoV-2 target nucleic acids are NOT DETECTED.  The SARS-CoV-2 RNA is generally detectable in upper respiratoy specimens during the acute phase of infection. The lowest concentration of SARS-CoV-2 viral copies this assay can detect is 131 copies/mL. A negative result does not preclude SARS-Cov-2 infection and should not be used as the sole basis for treatment or other patient management decisions. A negative result may occur with  improper specimen collection/handling, submission of specimen other than nasopharyngeal swab, presence of viral mutation(s) within the areas targeted by this assay, and inadequate number of viral copies (<131 copies/mL). A negative result must be combined with clinical observations, patient history, and epidemiological information. The expected result is Negative.  Fact Sheet for Patients:  PinkCheek.be  Fact Sheet for  Healthcare Providers:  GravelBags.it  This test is no t yet approved or cleared by the [place] FDA and  has been authorized for detection and/or diagnosis of SARS-CoV-2 by FDA under an Emergency Use Authorization (EUA). This EUA will remain  in effect (meaning this test can be used) for the duration of the COVID-19 declaration under Section 564(b)(1) of the Act, 21 U.S.C. section 360bbb-3(b)(1), unless the authorization is terminated or revoked sooner.     Influenza A by PCR NEGATIVE NEGATIVE Final   Influenza B by PCR NEGATIVE NEGATIVE Final    Comment: (NOTE) The Xpert Xpress SARS-CoV-2/FLU/RSV assay is intended as an aid in  the diagnosis of influenza from Nasopharyngeal swab specimens and  should not be used as a sole basis for treatment. Nasal washings and  aspirates are unacceptable for Xpert Xpress SARS-CoV-2/FLU/RSV  testing.  Fact Sheet for Patients: PinkCheek.be  Fact Sheet for Healthcare Providers: GravelBags.it  This test is not yet approved or cleared by the [place] FDA and  has been authorized for detection and/or diagnosis of SARS-CoV-2 by  FDA under an Emergency Use Authorization (EUA). This EUA will remain  in effect (meaning this test can be used) for the duration of the  Covid-19 declaration under Section 564(b)(1) of the Act, 21  U.S.C. section 360bbb-3(b)(1), unless the authorization is  terminated or revoked. Performed at [facility], [address], [place], [place] [address]       Imaging Studies   CT CHEST ABDOMEN PELVIS W CONTRAST  Result Date: 02/07/2020 CLINICAL DATA:  69 year old female with delirium and weight loss. Concern for malignancy. EXAM: CT CHEST, ABDOMEN, AND PELVIS WITH CONTRAST TECHNIQUE: Multidetector CT  imaging of the chest, abdomen and pelvis was performed following the standard protocol during bolus administration of  intravenous contrast. CONTRAST:  11mL OMNIPAQUE IOHEXOL 300 MG/ML  SOLN COMPARISON:  Chest CT dated 09/02/2019. FINDINGS: CT CHEST FINDINGS Cardiovascular: There is no cardiomegaly or pericardial effusion. There is calcification of the mitral annulus. The thoracic aorta is unremarkable. The origins of the great vessels of the aortic arch appear patent. There is common origin of the right brachiocephalic trunk and left common carotid artery. The central pulmonary arteries patent. Mediastinum/Nodes: No hilar or mediastinal adenopathy. There is a small hiatal hernia. The esophagus is grossly unremarkable. No mediastinal fluid collection. Lungs/Pleura: There is mild eventration of right hemidiaphragm with right lung base atelectasis. There is a cluster of nodularity in the left upper lobe anteriorly, likely chronic and sequela of prior inflammation/infection. No new consolidation. There is no pleural effusion pneumothorax. The central airways are patent. Musculoskeletal: Right shoulder arthroplasty. No acute osseous pathology. Degenerative changes of spine. CT ABDOMEN PELVIS FINDINGS No intra-abdominal free air or free fluid. Hepatobiliary: The liver is unremarkable. No intrahepatic biliary dilatation. Cholecystectomy. No retained calcified stone noted in the central CBD. Pancreas: Unremarkable. No pancreatic ductal dilatation or surrounding inflammatory changes. Spleen: Normal in size without focal abnormality. Adrenals/Urinary Tract: Large bilateral adrenal masses demonstrating heterogeneous attenuation with predominantly fat content measuring 8.2 x 8.6 cm on the left and 8.6 x 6.0 cm on the right. Findings may represent adrenal myelolipoma, although fat containing adrenocortical carcinoma is not excluded. Clinical correlation and further characterization with MRI without and with contrast (adrenal mass protocol MRI) is recommended. There is mild bilateral renal parenchyma atrophy. There is no hydronephrosis on  either side. There is symmetric enhancement and excretion of contrast by both kidneys. There is a 5 mm nonobstructing stone in the left renal pelvis. The visualized ureters and urinary bladder appear unremarkable. Stomach/Bowel: Postsurgical changes of gastric sleeve. There is no bowel obstruction or active inflammation. The appendix is normal. Vascular/Lymphatic: Minimal atherosclerotic calcification of the aorta. The IVC is unremarkable. No portal venous gas. There is no adenopathy. Reproductive: Hysterectomy. No adnexal masses. Other: None Musculoskeletal: Osteopenia with degenerative changes of the spine. No acute osseous pathology. IMPRESSION: 1. No acute intrathoracic, abdominal, or pelvic pathology. 2. Large bilateral adrenal masses as described. Findings may represent adrenal myelolipoma, although fat containing adrenocortical carcinoma is not excluded. Clinical correlation and further characterization with adrenal mass protocol MRI is recommended. 3. Aortic Atherosclerosis (ICD10-I70.0). Electronically Signed   By: Anner Crete M.D.   On: 02/07/2020 19:56     Medications   Scheduled Meds: . amLODipine  5 mg Oral Daily  . apixaban  5 mg Oral BID  . buPROPion  150 mg Oral Daily  . calcium-vitamin D  1 tablet Oral BID WC  . cholecalciferol  1,000 Units Oral BID WC  . cyanocobalamin  1,000 mcg Intramuscular Daily   Followed by  . [START ON 02/13/2020] vitamin B-12  1,000 mcg Oral Daily  . diclofenac Sodium  2 g Topical TID  . DULoxetine  60 mg Oral Daily  . folic acid  1 mg Oral Daily  . insulin aspart  0-5 Units Subcutaneous QHS  . insulin aspart  0-9 Units Subcutaneous TID WC  . levothyroxine  175 mcg Oral Q0600  . methenamine  1,000 mg Oral BID  . metoprolol succinate  50 mg Oral Daily  . mometasone-formoterol  2 puff Inhalation BID  . montelukast  10 mg Oral QHS  . multivitamin  with minerals  1 tablet Oral Daily  . oxybutynin  10 mg Oral QHS  . pantoprazole  40 mg Oral Daily    . pregabalin  50 mg Oral Daily  . rosuvastatin  20 mg Oral Daily  . senna-docusate  1 tablet Oral Once  . sucralfate  1 g Oral TID WC & HS  . thiamine  50 mg Oral BID WC   Continuous Infusions:     LOS: 3 days    Time spent: 45 minutes total with > 50% spent at bedside and/or in coordination of care.    Ezekiel Slocumb, DO [facility]  02/08/2020, 4:54 PM    If 7PM-7AM, please contact night-coverage. How to contact the [facility] Attending or Consulting provider [place] or covering provider during after hours [place], for this patient?    1. Check the care team in [facility] and look for a) attending/consulting [facility] provider listed and b) the [facility] team listed 2. Log into [email or web address] and use [place]'s universal password to access. If you do not have the password, please contact the hospital operator. 3. Locate the [facility] provider you are looking for under [facility] and page to a number that you can be directly reached. 4. If you still have difficulty reaching the provider, please page the [facility] (Director on Call) for the Hospitalists listed on amion for assistance.

## 2020-02-09 DIAGNOSIS — R296 Repeated falls: Secondary | ICD-10-CM | POA: Diagnosis not present

## 2020-02-09 DIAGNOSIS — I639 Cerebral infarction, unspecified: Secondary | ICD-10-CM | POA: Diagnosis not present

## 2020-02-09 DIAGNOSIS — E1151 Type 2 diabetes mellitus with diabetic peripheral angiopathy without gangrene: Secondary | ICD-10-CM | POA: Diagnosis not present

## 2020-02-09 DIAGNOSIS — G894 Chronic pain syndrome: Secondary | ICD-10-CM | POA: Diagnosis not present

## 2020-02-09 LAB — VITAMIN D 25 HYDROXY (VIT D DEFICIENCY, FRACTURES): Vit D, 25-Hydroxy: 26.5 ng/mL — ABNORMAL LOW (ref 30–100)

## 2020-02-09 LAB — GLUCOSE, CAPILLARY
Glucose-Capillary: 181 mg/dL — ABNORMAL HIGH (ref 70–99)
Glucose-Capillary: 151 mg/dL — ABNORMAL HIGH (ref 70–99)
Glucose-Capillary: 164 mg/dL — ABNORMAL HIGH (ref 70–99)
Glucose-Capillary: 138 mg/dL — ABNORMAL HIGH (ref 70–99)

## 2020-02-09 LAB — HEAVY METALS, BLOOD
Arsenic: <1 ug/L — ABNORMAL LOW (ref 2–23)
Lead: <1 ug/dL (ref 0–4)
Mercury: <1 ug/L (ref 0.0–14.9)

## 2020-02-09 LAB — VITAMIN E
Vitamin E (Alpha Tocopherol): 8.2 mg/L — ABNORMAL LOW (ref 9.0–29.0)
Vitamin E(Gamma Tocopherol): 1.2 mg/L (ref 0.5–4.9)

## 2020-02-09 LAB — BASIC METABOLIC PANEL
Anion gap: 9 (ref 5–15)
BUN: 21 mg/dL (ref 8–23)
CO2: 28 mmol/L (ref 22–32)
Calcium: 9.3 mg/dL (ref 8.9–10.3)
Chloride: 103 mmol/L (ref 98–111)
Creatinine, Ser: 0.99 mg/dL (ref 0.44–1.00)
GFR, Estimated: >60 mL/min (ref >60)
Glucose, Bld: 131 mg/dL — ABNORMAL HIGH (ref 70–99)
Potassium: 4.5 mmol/L (ref 3.5–5.1)
Sodium: 140 mmol/L (ref 135–145)

## 2020-02-09 LAB — MAGNESIUM: Magnesium: 2 mg/dL (ref 1.7–2.4)

## 2020-02-09 NOTE — Progress Notes (Signed)
Physical Therapy Treatment Patient Details Name: [name] MRN: [id number] DOB: Oct 02, 1950 Today's Date: 02/09/2020    History of Present Illness [name] is a 50yoF who comes to [facility] on 11/14 c Lt shoulder pain, bilat pedal pain, bladder spasm. Pt was recently at [place] [facility], has been home for 9 days, initially getting around well, AMB in house with RW. Per RN, pt has not been taking lyrica, which pt says is for her neuropathy, formerly seeing pain management. PMH includes chronic left achilles wound followed by podiatry, chronic Left diabetic heel ulcer followed by podiatry, PVD, chronic left shoulder DJD/pain; Rt shoulder OA s/p surgery. Pt went to MRI late 11/15 found to have acute frontal lobe infarcts.    PT Comments    Pt resting in bed upon PT arrival and reporting ace wrap removed from L foot and replaced with bandages; d/t this pt able to utilize pair of wedge shoes (that pt reports podiatrist liked her to wear d/t foot wounds).  Pt pre-medicated with pain meds for PT session.  No pain at rest beginning of session; 5-6/10 L UE pain end of session at rest.  Mod assist for trunk semi-supine to sitting edge of bed.  Bed height required to be elevated in order for pt to stand up to walker with assist.  Max assist x1 1st trial; pt incontinent of bowel and requesting to use BSC.  Pt required increased assist (mod assist x2) to stand from elevated bed 2nd trial; mod assist x2 to partially stand up to RW from [facility]; then min assist x2 to fully stand from [facility].  Pt requiring multiple efforts/attempts to stand up to walker each time and cueing for technique.  Pt able to take steps slowly bed to/from [facility] with RW and 2 assist.  Pt assisted with repositioning in bed for comfort end of session.  Pt's husband arrived end of session and updated on pt's functional status with pt's permission.  Will continue to focus on strengthening and progressive functional mobility per pt tolerance.    Follow Up Recommendations  SNF     Equipment Recommendations  Rolling walker with 5" wheels;3in1 (PT);Wheelchair (measurements PT);Wheelchair cushion (measurements PT);Hospital bed;Other (comment) (hoyer lift)    Recommendations for Other Services OT consult     Precautions / Restrictions Precautions Precautions: Fall Restrictions Weight Bearing Restrictions: Yes LLE Weight Bearing: Weight bearing as tolerated Other Position/Activity Restrictions: Per Podiatry note: "Patient may be weightbearing as tolerated to the left foot but needs to be wearing something to protect the posterior heel area from rubbing against shoe or boot and needs pressure relief on the plantar heel.  Recommended to physical therapy wearing a wedge shoe for forefoot ambulation."    Mobility  Bed Mobility Overal bed mobility: Needs Assistance Bed Mobility: Supine to Sit;Sit to Supine     Supine to sit: Mod assist;HOB elevated Sit to supine: Min guard;HOB elevated   General bed mobility comments: assist for trunk semi-supine to sitting edge of bed; CGA for LE's sit to semi-supine; vc's for technique  Transfers Overall transfer level: Needs assistance Equipment used: Rolling walker (2 wheeled) Transfers: Sit to/from Stand Sit to Stand: Min assist;Mod assist;Max assist         General transfer comment: unable to stand from mildly elevated bed so bed height increased and pt able to stand with max assist x1 up to RW (pt incontinent of bowel so pt assisted back to sitting down); mod assist x2 to stand from  elevated bed up to RW with multiple attempts; mod assist x2 to partial stand up from [facility] up to RW (pt unable to come to full upright so pt assisted back to sitting down on BSC); then min assist x2 to stand up from [facility] with multiple efforts; vc's and assist for UE/LE positioning  Ambulation/Gait Ambulation/Gait assistance: Min guard;Min assist;+2 physical assistance Gait Distance (Feet):  (3 feet x2 (bed  to/from [facility])) Assistive device: Rolling walker (2 wheeled)   Gait velocity: decreased   General Gait Details: increased effort and time to take steps with B LE's   Stairs             Wheelchair Mobility    Modified Rankin (Stroke Patients Only)       Balance Overall balance assessment: Needs assistance Sitting-balance support: No upper extremity supported;Feet supported Sitting balance-Leahy Scale: Good Sitting balance - Comments: steady sitting reaching within BOS with R UE   Standing balance support: Bilateral upper extremity supported Standing balance-Leahy Scale: Fair Standing balance comment: pt requiring B UE support on RW for static standing balance                            Cognition Arousal/Alertness: Awake/alert Behavior During Therapy: WFL for tasks assessed/performed Overall Cognitive Status: Within Functional Limits for tasks assessed                                        Exercises      General Comments General comments (skin integrity, edema, etc.): bandages noted L foot/heel.  Nursing cleared pt for participation in physical therapy.  Pt agreeable to PT session.      Pertinent Vitals/Pain Pain Assessment: 0-10 Pain Score: 6  Pain Location: L upper arm/shoulder Pain Descriptors / Indicators: Aching;Burning Pain Intervention(s): Limited activity within patient's tolerance;Monitored during session;Premedicated before session;Repositioned    Home Living                      Prior Function            PT Goals (current goals can now be found in the care plan section) Acute Rehab PT Goals Patient Stated Goal: improve pain in L UE to tolerate transfers PT Goal Formulation: With patient Time For Goal Achievement: 02/19/20 Potential to Achieve Goals: Fair Progress towards PT goals: Progressing toward goals    Frequency    7X/week      PT Plan Current plan remains appropriate    Co-evaluation               AM-PAC PT "6 Clicks" Mobility   Outcome Measure  Help needed turning from your back to your side while in a flat bed without using bedrails?: A Lot Help needed moving from lying on your back to sitting on the side of a flat bed without using bedrails?: A Lot Help needed moving to and from a bed to a chair (including a wheelchair)?: Total Help needed standing up from a chair using your arms (e.g., wheelchair or bedside chair)?: Total Help needed to walk in hospital room?: Total Help needed climbing 3-5 steps with a railing? : Total 6 Click Score: 8    End of Session Equipment Utilized During Treatment: Gait belt Activity Tolerance: Patient limited by pain;Patient limited by fatigue Patient left: in bed;with call bell/phone within reach;with bed  alarm set;Other (comment) (B heels floating via pillow support) Nurse Communication: Mobility status;Precautions;Other (comment) (pt's pain status) PT Visit Diagnosis: Difficulty in walking, not elsewhere classified (R26.2);Muscle weakness (generalized) (M62.81)     Time: 2951-8841 PT Time Calculation (min) (ACUTE ONLY): 53 min  Charges:  $Therapeutic Activity: 53-67 mins                    Leitha Bleak, PT 02/09/20, 12:12 PM

## 2020-02-09 NOTE — Progress Notes (Signed)
F/u left achilles wound.  Stable at this time.  No drainage.  Looks to be very superficial at this time.  Recommend continue with pressure reducing posterior heel pad 3 x/s per week. Can d/c betadine.  Cleanse wound with saline and recover 3 x's per week with prevalon type padded heel dressing.  F/U with podiatry in 2 weeks.

## 2020-02-09 NOTE — Plan of Care (Signed)
  Problem: Education: Goal: Knowledge of disease or condition will improve Outcome: Progressing Goal: Knowledge of secondary prevention will improve Outcome: Progressing Goal: Knowledge of patient specific risk factors addressed and post discharge goals established will improve Outcome: Progressing Goal: Individualized Educational Video(s) Outcome: Progressing   Problem: Coping: Goal: Will verbalize positive feelings about self Outcome: Progressing Goal: Will identify appropriate support needs Outcome: Progressing   Problem: Health Behavior/Discharge Planning: Goal: Ability to manage health-related needs will improve Outcome: Progressing   Problem: Self-Care: Goal: Ability to participate in self-care as condition permits will improve Outcome: Progressing Goal: Verbalization of feelings and concerns over difficulty with self-care will improve Outcome: Progressing Goal: Ability to communicate needs accurately will improve Outcome: Progressing   Problem: Nutrition: Goal: Risk of aspiration will decrease Outcome: Progressing Goal: Dietary intake will improve Outcome: Progressing   Problem: Intracerebral Hemorrhage Tissue Perfusion: Goal: Complications of Intracerebral Hemorrhage will be minimized Outcome: Progressing   Problem: Ischemic Stroke/TIA Tissue Perfusion: Goal: Complications of ischemic stroke/TIA will be minimized Outcome: Progressing   Problem: Spontaneous Subarachnoid Hemorrhage Tissue Perfusion: Goal: Complications of Spontaneous Subarachnoid Hemorrhage will be minimized Outcome: Progressing   

## 2020-02-09 NOTE — TOC Progression Note (Signed)
Transition of Care [facility]) - Progression Note    Patient Details  Name: [name] MRN: [id number] Date of Birth: 12/08/1950  Transition [facility]) CM/SW Contact  Shelbie Hutching, RN Phone Number: 02/09/2020, 12:57 PM  Clinical Narrative:    Discharge plan agreed on with attending MD, patient and patient's husband.  Patient will discharge home with home health services and equipment.  RNCM is ordering hospital bed, hoyer lift and wheelchair for patient at home.  Referral given to [facility] with [employer].  Equipment could potentially be delivered this afternoon but at the latest in the morning.  Patient will need EMS transport home.  Patient is open with [facility] for home health services.  Kristen with [facility] notified that patient will discharge this afternoon or tomorrow.  Patient will need home health RN, PT, OT, aide, and SW.     Expected Discharge Plan: [place] Barriers to Discharge: Equipment Delay  Expected Discharge Plan and Services Expected Discharge Plan: [place]   Discharge Planning Services: CM Consult Post Acute Care Choice: [place], Resumption of Svcs/PTA Provider Living arrangements for the past 2 months: [place]                 DME Arranged: Hospital bed, Lightweight manual wheelchair with seat cushion, Other see comment Harrel Lemon lift) DME Agency: [employer] Date DME Agency Contacted: 02/09/20 Time DME Agency Contacted: [id number] Representative spoke with at DME Agency: Mardene Celeste HH Arranged: RN, PT, OT, Nurse's Aide, Social Work [employer] Agency: [facility] Date [place]: 02/09/20 Time [place]: [id number] Representative spoke with at [place]: [place] ([place]) Interventions    Readmission Risk Interventions Readmission Risk Prevention Plan 07/30/2018  Transportation Screening Complete  PCP or Specialist Appt within 3-5 Days Complete  HRI or [place]  Complete  Social Work Consult for [place] Planning/Counseling Patient refused  Palliative Care Screening Patient Refused  Medication Review Press photographer) Complete  Some recent data might be hidden

## 2020-02-09 NOTE — Progress Notes (Signed)
PROGRESS NOTE    PENNIE VANBLARCOM   OBS:[id number]  DOB: 1950/10/05  PCP: [name], [name], MD    DOA: 02/04/2020 LOS: 4   Brief Narrative   HPI from Dr Cherrie Distance is a 69 y.o. adult with medical history significant of recent admission for acute stroke, paroxysmal atrial fibrillation on Eliquis, DM2, morbid obesity, chronic pain syndrome, previous DVT, sleep apnea on CPAP, presented with increased confusion, worsening left-sided weakness, falls from home on 02/04/2020. Patient was also endorsing increased pain throughout in her left shoulder and feet. Reported bladder spasms, bladder incontinence, family initially was suspected UTI, but later reported that when patient had similar presentation, she was found to have a CVA. Last admit was a month ago for stroke of which her Pradaxa was switched to Eliquis. Pt has ASA allergy.  Patient was discharged to SNF and eventually discharged home.  In the ED, MRI showed new CVA, patient also managed for possible UTI.  Patient admitted for further management.     Assessment & Plan   Principal Problem:   Stroke [facility]) Active Problems:   Recurrent falls   Acute embolic stroke (HCC)   Folate deficiency   Vitamin D deficiency   Paroxysmal atrial fibrillation (HCC); CHA2DS2-Vasc Score 5. On Pradaxa   Essential hypertension   DM (diabetes mellitus) type II, controlled, with peripheral vascular disorder ([place])   Hypertension associated with diabetes ([place])   Malnutrition of moderate degree   Sleep apnea   Hypothyroidism, unspecified   Gastro-esophageal reflux disease without esophagitis   Mass of both adrenal glands ([place])   Morbid obesity with BMI of 40.0-44.9, adult (HCC)   Chronic pain syndrome  Recurrent stroke / Acute cortical infarct of posterior left frontal lobe - POA, with recent stroke in Sept this year.  Pt had just returned home from rehab about 8-10 days prior to this admission.  Has been compliance with  anticoagulation for A-fib.   Echo in 6/21 showed EF of 60 to 65%, no regional wall motion abnormality, no atrial level shunt detected by color-flow Doppler. LDL at goal 68.  Hbg A1c is 7.1% --Neurology following, suspects recurrent strokes due to her malnutrition given hx of gastric sleeve and pt not taking vitamin supplements as prescribed (vit B12 deficiency can lead to hypercoagulable state).    --CT chest/abd/pelvis 11/17 negative for malignancies (did show chronic and stable adrenal masses) --Continue Eliquis --PT/OT - SNF recommended --Neuro checks --Telemetry monitoring --Strict compliance with vitamin supplements --IV iron given per GI on 11/18  Right shoulder pain - OT evaluation.  Voltaren gel.  Currently limited patient's ability to use walker.   - this significantly limits patient' ability to use walker  Abnormal UA - on admission UA showed large leukocytes, few bacteria, greater than 50 WBC. Cultlure grew multiple species, likely contamination.  Pt asymptomatic.  She was given fosfomycin on 02/05/20.   Monitor.   - Pt remains asymptomatic.  Hypertension - chronic, stable.  Meds were held for permissive HTN.  Resume amlodipine.  Monitor BP and resume Hyzaar when indicated.    Paroxysmal A-fib - rate controlled.  Continue Eliquis and metoprolol.  Type 2 Diabetes with Polyneuropathy - Hbg A1c is 7.1%, poorly controlled.  Hold metformin and Januvia.  Sliding scale Novolog. Continue Lyrica and B12 supplementation for neuropathy.  Unsteady Gait - with intolerance for weight-bearing on her feet.  This seems primarily due severe neuropathy.  Continue Lyrica, B12 supplementation as below.  PT/OT to follow.  Vitamin B12 deficiency - likely contributes to neuropathy, secondary to malnutrition s/p gastric sleeve and noncompliance with vitamin supplements.  Continue IM B12 injections daily x 7 days, then PO replacement. --Follow up pending labs: thiamine, B6, heavy metals screen.     --Consider outpatient EMC/NCS if persistent symptoms after B12 repletion.  Folic acid deficiency - continue oral supplement.  Vitamin D deficiency - continue oral supplement.  Nonhealing Left Heel Ulcer - follows with podiatry, they are consulted.  Vascular was consulted at request of podiatry.  Recommend outpatient follow up in 1-2 weeks for repeat ABI's (this and prior angiogram in 2019 showed good flow).   --Monitor closely for s/sx's of infection --wound care, daily dressing changes --off-load heels --wt bearing as tolerated on LLE, with wedge shoe recommended for forefoot ambulation  Hypothyroidism - continue Synthroid  Obstructive Sleep Apnea - CPAP ordered  Normocytic anemia - is likely multifactorial with B12 deficiency contributing.  Iron studies with AM labs.  Depression / Anxiety - continue Wellbutrin, Cymbalta PRN Xanax   Obesity: Body mass index is 39.99 kg/m.   Complicates overall care and prognosis.  Recommend lifestyle modifications.    DVT prophylaxis: SCD's Start: 02/06/20 0130 apixaban (ELIQUIS) tablet 5 mg   Diet:  Diet Orders (From admission, onward)    Start     Ordered   02/06/20 5102  Diet heart healthy/carb modified Room service appropriate? Yes; Fluid consistency: Thin  Diet effective now       Question Answer Comment  Diet-HS Snack? Nothing   Room service appropriate? Yes   Fluid consistency: Thin      02/06/20 5852            Code Status: Full Code    Subjective 02/09/20    Pt seen this with husband and case manager at bedside.  PT and OT recommend SNF for rehab, but patient adamant she wants to return home.  Case manager arranging for all needed DME including hospital bed and hoyer lift.  Pt feeling okay today, no new or worsening neurologic complaints.  Pain in her feet better when up bearing weight with PT.     Disposition Plan & Communication   Status is: Inpatient  Remains inpatient appropriate because: DME delivery pending.  SNF  recommended by patient, she declines.  Will need hospital bed, hoyer lift etc delivered home prior to safe d/c.  Expect d/c home tomorrow.   Dispo: The patient is from: Home              Anticipated d/c is to: SNF              Anticipated d/c date is: 1 day              Patient currently is not medically stable to d/c.   Family Communication: husband at bedside on rounds today   Consults, Procedures, Significant Events   Consultants:   Neurology  Procedures:   None  Antimicrobials:  Anti-infectives (From admission, onward)   Start     Dose/Rate Route Frequency Ordered Stop   02/05/20 0000  methenamine (MANDELAMINE) tablet 1,000 mg        1,000 mg Oral 2 times daily 02/04/20 2350     02/04/20 2215  fosfomycin (MONUROL) packet 3 g        3 g Oral STAT 02/04/20 2210 02/04/20 2224   02/04/20 2100  sulfamethoxazole-trimethoprim (BACTRIM DS) 800-160 MG per tablet 1 tablet  Status:  Discontinued  1 tablet Oral 2 times daily 02/04/20 2047 02/04/20 2210         Objective   Vitals:   02/09/20 0022 02/09/20 0445 02/09/20 0832 02/09/20 1206  BP: (!) 142/62 130/84 (!) 127/45 137/70  Pulse: (!) 56 (!) 55 61 (!) 55  Resp: 15 14 20 17   Temp: (!) 97.4 F (36.3 C) 98 F (36.7 C) 97.7 F (36.5 C) 97.9 F (36.6 C)  TempSrc: Oral Oral Oral Oral  SpO2: 93% 100% 95% 100%  Weight:      Height:        Intake/Output Summary (Last 24 hours) at 02/09/2020 1513 Last data filed at 02/09/2020 0500 Gross per 24 hour  Intake --  Output 900 ml  Net -900 ml   Filed Weights   02/04/20 1428  Weight: 109 kg    Physical Exam:  General exam: awake, alert, no acute distress, obese Respiratory system: normal respiratory effort, symmetric chest rise, on room air Cardiovascular system: normal S1/S2, no pedal edema.   Central nervous system: A&O x4. normal speech, stable mild RUE weakness   Labs   Data Reviewed: I have personally reviewed following labs and imaging  studies  CBC: Recent Labs  Lab 02/04/20 1645 02/07/20 0445 02/08/20 0557  WBC 7.0 4.1 5.2  NEUTROABS 5.3 2.3  --   HGB 10.4* 9.6* 10.5*  HCT 33.2* 30.5*   31.2* 33.8*  MCV 82.2 82.7 83.0  PLT 238 256 967   Basic Metabolic Panel: Recent Labs  Lab 02/04/20 1645 02/07/20 0445 02/09/20 0412  NA 140 139 140  K 4.4 4.7 4.5  CL 102 101 103  CO2 30 28 28   GLUCOSE 143* 144* 131*  BUN 16 18 21   CREATININE 1.01* 0.96 0.99  CALCIUM 8.9 8.9 9.3  MG  --   --  2.0   GFR: Estimated Creatinine Clearance (by C-G formula based on SCr of 0.99 mg/dL) Female: 64.9 mL/min Female: 79.1 mL/min Liver Function Tests: No results for input(s): AST, ALT, ALKPHOS, BILITOT, PROT, ALBUMIN in the last 168 hours. No results for input(s): LIPASE, AMYLASE in the last 168 hours. Recent Labs  Lab 02/07/20 0446  AMMONIA <9*   Coagulation Profile: No results for input(s): INR, PROTIME in the last 168 hours. Cardiac Enzymes: No results for input(s): CKTOTAL, CKMB, CKMBINDEX, TROPONINI in the last 168 hours. BNP (last 3 results) No results for input(s): PROBNP in the last 8760 hours. HbA1C: No results for input(s): HGBA1C in the last 72 hours. CBG: Recent Labs  Lab 02/08/20 1133 02/08/20 1551 02/08/20 2100 02/09/20 0832 02/09/20 1206  GLUCAP 195* 130* 149* 181* 151*   Lipid Profile: No results for input(s): CHOL, HDL, LDLCALC, TRIG, CHOLHDL, LDLDIRECT in the last 72 hours. Thyroid Function Tests: No results for input(s): TSH, T4TOTAL, FREET4, T3FREE, THYROIDAB in the last 72 hours. Anemia Panel: Recent Labs    02/07/20 0445 02/08/20 0557  FOLATE 4.2*  --   FERRITIN  --  29  TIBC  --  409  IRON  --  34   Sepsis Labs: No results for input(s): PROCALCITON, LATICACIDVEN in the last 168 hours.  Recent Results (from the past 240 hour(s))  Urine culture     Status: Abnormal   Collection Time: 02/05/20  3:41 AM   Specimen: Urine, Random  Result Value Ref Range Status   Specimen  Description   Final    URINE, RANDOM Performed at [facility], [address], [place], [place] [address]  Special Requests   Final    NONE Performed at [facility], [address] [place], [place] [address]    Culture MULTIPLE SPECIES PRESENT, SUGGEST RECOLLECTION (A)  Final   Report Status 02/06/2020 FINAL  Final  Respiratory Panel by RT PCR (Flu A&B, Covid) - Nasopharyngeal Swab     Status: None   Collection Time: 02/05/20  3:41 AM   Specimen: Nasopharyngeal Swab  Result Value Ref Range Status   SARS Coronavirus 2 by RT PCR NEGATIVE NEGATIVE Final    Comment: (NOTE) SARS-CoV-2 target nucleic acids are NOT DETECTED.  The SARS-CoV-2 RNA is generally detectable in upper respiratoy specimens during the acute phase of infection. The lowest concentration of SARS-CoV-2 viral copies this assay can detect is 131 copies/mL. A negative result does not preclude SARS-Cov-2 infection and should not be used as the sole basis for treatment or other patient management decisions. A negative result may occur with  improper specimen collection/handling, submission of specimen other than nasopharyngeal swab, presence of viral mutation(s) within the areas targeted by this assay, and inadequate number of viral copies (<131 copies/mL). A negative result must be combined with clinical observations, patient history, and epidemiological information. The expected result is Negative.  Fact Sheet for Patients:  PinkCheek.be  Fact Sheet for Healthcare Providers:  GravelBags.it  This test is no t yet approved or cleared by the [place] FDA and  has been authorized for detection and/or diagnosis of SARS-CoV-2 by FDA under an Emergency Use Authorization (EUA). This EUA will remain  in effect (meaning this test can be used) for the duration of the COVID-19 declaration under Section 564(b)(1) of the Act, 21 U.S.C. section  360bbb-3(b)(1), unless the authorization is terminated or revoked sooner.     Influenza A by PCR NEGATIVE NEGATIVE Final   Influenza B by PCR NEGATIVE NEGATIVE Final    Comment: (NOTE) The Xpert Xpress SARS-CoV-2/FLU/RSV assay is intended as an aid in  the diagnosis of influenza from Nasopharyngeal swab specimens and  should not be used as a sole basis for treatment. Nasal washings and  aspirates are unacceptable for Xpert Xpress SARS-CoV-2/FLU/RSV  testing.  Fact Sheet for Patients: PinkCheek.be  Fact Sheet for Healthcare Providers: GravelBags.it  This test is not yet approved or cleared by the [place] FDA and  has been authorized for detection and/or diagnosis of SARS-CoV-2 by  FDA under an Emergency Use Authorization (EUA). This EUA will remain  in effect (meaning this test can be used) for the duration of the  Covid-19 declaration under Section 564(b)(1) of the Act, 21  U.S.C. section 360bbb-3(b)(1), unless the authorization is  terminated or revoked. Performed at [facility], [address] [place], [place] [address]       Imaging Studies   CT CHEST ABDOMEN PELVIS W CONTRAST  Result Date: 02/07/2020 CLINICAL DATA:  69 year old female with delirium and weight loss. Concern for malignancy. EXAM: CT CHEST, ABDOMEN, AND PELVIS WITH CONTRAST TECHNIQUE: Multidetector CT imaging of the chest, abdomen and pelvis was performed following the standard protocol during bolus administration of intravenous contrast. CONTRAST:  118mL OMNIPAQUE IOHEXOL 300 MG/ML  SOLN COMPARISON:  Chest CT dated 09/02/2019. FINDINGS: CT CHEST FINDINGS Cardiovascular: There is no cardiomegaly or pericardial effusion. There is calcification of the mitral annulus. The thoracic aorta is unremarkable. The origins of the great vessels of the aortic arch appear patent. There is common origin of the right brachiocephalic trunk and left common  carotid artery. The central pulmonary  arteries patent. Mediastinum/Nodes: No hilar or mediastinal adenopathy. There is a small hiatal hernia. The esophagus is grossly unremarkable. No mediastinal fluid collection. Lungs/Pleura: There is mild eventration of right hemidiaphragm with right lung base atelectasis. There is a cluster of nodularity in the left upper lobe anteriorly, likely chronic and sequela of prior inflammation/infection. No new consolidation. There is no pleural effusion pneumothorax. The central airways are patent. Musculoskeletal: Right shoulder arthroplasty. No acute osseous pathology. Degenerative changes of spine. CT ABDOMEN PELVIS FINDINGS No intra-abdominal free air or free fluid. Hepatobiliary: The liver is unremarkable. No intrahepatic biliary dilatation. Cholecystectomy. No retained calcified stone noted in the central CBD. Pancreas: Unremarkable. No pancreatic ductal dilatation or surrounding inflammatory changes. Spleen: Normal in size without focal abnormality. Adrenals/Urinary Tract: Large bilateral adrenal masses demonstrating heterogeneous attenuation with predominantly fat content measuring 8.2 x 8.6 cm on the left and 8.6 x 6.0 cm on the right. Findings may represent adrenal myelolipoma, although fat containing adrenocortical carcinoma is not excluded. Clinical correlation and further characterization with MRI without and with contrast (adrenal mass protocol MRI) is recommended. There is mild bilateral renal parenchyma atrophy. There is no hydronephrosis on either side. There is symmetric enhancement and excretion of contrast by both kidneys. There is a 5 mm nonobstructing stone in the left renal pelvis. The visualized ureters and urinary bladder appear unremarkable. Stomach/Bowel: Postsurgical changes of gastric sleeve. There is no bowel obstruction or active inflammation. The appendix is normal. Vascular/Lymphatic: Minimal atherosclerotic calcification of the aorta. The IVC is  unremarkable. No portal venous gas. There is no adenopathy. Reproductive: Hysterectomy. No adnexal masses. Other: None Musculoskeletal: Osteopenia with degenerative changes of the spine. No acute osseous pathology. IMPRESSION: 1. No acute intrathoracic, abdominal, or pelvic pathology. 2. Large bilateral adrenal masses as described. Findings may represent adrenal myelolipoma, although fat containing adrenocortical carcinoma is not excluded. Clinical correlation and further characterization with adrenal mass protocol MRI is recommended. 3. Aortic Atherosclerosis (ICD10-I70.0). Electronically Signed   By: Anner Crete M.D.   On: 02/07/2020 19:56     Medications   Scheduled Meds:  amLODipine  5 mg Oral Daily   apixaban  5 mg Oral BID   buPROPion  150 mg Oral Daily   calcium-vitamin D  1 tablet Oral BID WC   cholecalciferol  1,000 Units Oral BID WC   cyanocobalamin  1,000 mcg Intramuscular Daily   Followed by   Derrill Memo ON 02/13/2020] vitamin B-12  1,000 mcg Oral Daily   diclofenac Sodium  2 g Topical TID   DULoxetine  60 mg Oral Daily   folic acid  1 mg Oral Daily   insulin aspart  0-5 Units Subcutaneous QHS   insulin aspart  0-9 Units Subcutaneous TID WC   levothyroxine  175 mcg Oral Q0600   methenamine  1,000 mg Oral BID   metoprolol succinate  50 mg Oral Daily   mometasone-formoterol  2 puff Inhalation BID   montelukast  10 mg Oral QHS   multivitamin with minerals  1 tablet Oral Daily   oxybutynin  10 mg Oral QHS   pantoprazole  40 mg Oral Daily   pregabalin  50 mg Oral Daily   rosuvastatin  20 mg Oral Daily   senna-docusate  1 tablet Oral Once   sucralfate  1 g Oral TID WC & HS   thiamine  50 mg Oral BID WC   Continuous Infusions:     LOS: 4 days    Time spent: 25 minutes total with >  50% spent at bedside and/or in coordination of care.    Ezekiel Slocumb, DO [facility]  02/09/2020, 3:13 PM    If 7PM-7AM, please contact  night-coverage. How to contact the [facility] Attending or Consulting provider [place] or covering provider during after hours [place], for this patient?    1. Check the care team in [facility] and look for a) attending/consulting TRH provider listed and b) the [facility] team listed 2. Log into [email or web address] and use [place]'s universal password to access. If you do not have the password, please contact the hospital operator. 3. Locate the [facility] provider you are looking for under [facility] and page to a number that you can be directly reached. 4. If you still have difficulty reaching the provider, please page the [facility] (Director on Call) for the Hospitalists listed on amion for assistance.

## 2020-02-09 NOTE — TOC Progression Note (Signed)
Transition of Care [facility]) - Progression Note    Patient Details  Name: [name] MRN: [id number] Date of Birth: [birth year]  [facility] CM/SW Contact  Shelbie Hutching, RN Phone Number: 02/09/2020, 3:32 PM  Clinical Narrative:    Equipment will be delivered tomorrow.  Patient will need EMS transport at discharge.    Expected Discharge Plan: [place] Barriers to Discharge: Equipment Delay  Expected Discharge Plan and Services Expected Discharge Plan: [place]   Discharge Planning Services: CM Consult Post Acute Care Choice: [place], Resumption of Svcs/PTA Provider Living arrangements for the past 2 months: [place]                 DME Arranged: Hospital bed, Lightweight manual wheelchair with seat cushion, Other see comment Harrel Lemon lift) DME Agency: [employer] Date DME Agency Contacted: 02/09/20 Time DME Agency Contacted: [phone] Representative spoke with at DME Agency: Mardene Celeste HH Arranged: RN, PT, OT, Nurse's Aide, Social Work [employer] Agency: [facility] Date [place]: 02/09/20 Time [place]: [id number] Representative spoke with at [place]: [place] ([place]) Interventions    Readmission Risk Interventions Readmission Risk Prevention Plan 07/30/2018  Transportation Screening Complete  PCP or Specialist Appt within 3-5 Days Complete  HRI or [place] Complete  Social Work Consult for [place] Planning/Counseling Patient refused  Palliative Care Screening Patient Refused  Medication Review Press photographer) Complete  Some recent data might be hidden

## 2020-02-09 NOTE — Progress Notes (Cosign Needed)
    Durable Medical Equipment  (From admission, onward)         Start     Ordered   02/09/20 1246  For home use only DME lightweight manual wheelchair with seat cushion  Once       Comments: Patient suffers from stroke and chronic pain syndrome which impairs their ability to perform daily activities like bathing, dressing, walking and preparing meals in the home.  A walker will not resolve  issue with performing activities of daily living. A wheelchair will allow patient to safely perform daily activities. Patient is not able to propel themselves in the home using a standard weight wheelchair due to weakness and pain. Patient can self propel in the lightweight wheelchair. Length of need lifetime. Accessories: elevating leg rests (ELRs), wheel locks, extensions and anti-tippers, seat cushion and back cushion.   02/09/20 1247   02/09/20 1245  For home use only DME Hospital bed  Once       Question Answer Comment  Length of Need Lifetime   Patient has (list medical condition): CVA and chronic pain syndrome   The above medical condition requires: Patient requires the ability to reposition frequently   Head must be elevated greater than: 30 degrees   Bed type Semi-electric   [employer] Yes   Trapeze Bar Yes   Support Surface: Gel Overlay      02/09/20 1247

## 2020-02-09 NOTE — Progress Notes (Signed)
Occupational Therapy Treatment Patient Details Name: [name] MRN: [id number] DOB: December 15, 1950 Today's Date: 02/09/2020    History of present illness [name] is a 55yoF who comes to [facility] on 11/14 c Lt shoulder pain, bilat pedal pain, bladder spasm. Pt was recently at [place] at [facility], has been home for 9 days, initially getting around well, AMB in house with RW. Per RN, pt has not been taking lyrica, which pt says is for her neuropathy, formerly seeing pain management. PMH includes chronic left achilles wound followed by podiatry, chronic Left diabetic heel ulcer followed by podiatry, PVD, chronic left shoulder DJD/pain; Rt shoulder OA s/p surgery. Pt went to MRI late 11/15 found to have acute frontal lobe infarcts.   OT comments  [name] was seen for OT treatment on this date. Upon arrival to room pt reclinedin bed c husband at bed side. Pt reports fear of pain c mobility - defers sitting EOB or attempting transfers. Pt states plan to d/c home next date and reports that she will be able to walk again once home. Pt and husband educated re: DME recs, d/c recs, home/routines modifications, adapted dressing techniques, LUE HEP, and falls prevention. Pt agreeable to bed level LUE THEREX: P/AAROM elbow flexion/extension (~10-110*), shoulder flexion (~75*), shoulder abduction (~80*), and horizontal adduction (~90*). Pt has notably improved ROM when distracted c conversation. Pt tolerates ~15 mins - reports 10/10 pain at end range of movement and states "you have to stop that now it's too much." Pt continues to benefit from skilled OT services to maximize return to PLOF and minimize risk of future falls, injury, caregiver burden, and readmission. Will continue to follow POC. Discharge recommendation updated to reflect pt continued pain limiting participate in transfers, unsafe to return home.    Follow Up Recommendations  SNF    Equipment Recommendations  Other (comment) (TBD)     Recommendations for Other Services      Precautions / Restrictions Precautions Precautions: Fall Restrictions Weight Bearing Restrictions: Yes LLE Weight Bearing: Weight bearing as tolerated Other Position/Activity Restrictions: Per Podiatry note: "Patient may be weightbearing as tolerated to the left foot but needs to be wearing something to protect the posterior heel area from rubbing against shoe or boot and needs pressure relief on the plantar heel.  Recommended to physical therapy wearing a wedge shoe for forefoot ambulation."       Mobility Bed Mobility Overal bed mobility: Needs Assistance   General bed mobility comments: Pt refuses mobility stating pain   Transfers   General transfer comment: Pt refuses mobility stating pain         ADL either performed or assessed with clinical judgement   ADL Overall ADL's : Needs assistance/impaired     General ADL Comments: SETUP self-drinking at bed level c RUE - unable to tolerate LUE use.                Cognition Arousal/Alertness: Awake/alert Behavior During Therapy: WFL for tasks assessed/performed Overall Cognitive Status: Within Functional Limits for tasks assessed            Exercises Exercises: Other exercises Other Exercises Other Exercises: LUE THEREX: P/AAROM elbow flexion/extension (~10-110*), shoulder flexion (~75*), shoulder abduction (~80*), and horizontal adduction (~90*) Other Exercises: Pt and husband educated re: DME recs, d/c recs, home/routinesm odifications, adapted dressing techniques, LUE HEP, falls prevetnion, ECS   Shoulder Instructions       General Comments bandages noted L foot/heel    Pertinent Vitals/ Pain  Pain Assessment: Faces Pain Score: 6  Faces Pain Scale: Hurts worst Pain Location: L upper arm/shoulder Pain Descriptors / Indicators: Aching;Burning Pain Intervention(s): Limited activity within patient's tolerance;Repositioned         Frequency  Min 1X/week         Progress Toward Goals  OT Goals(current goals can now be found in the care plan section)  Progress towards OT goals: Progressing toward goals  Acute Rehab OT Goals Patient Stated Goal: improve pain in L UE to tolerate transfers OT Goal Formulation: With patient Time For Goal Achievement: 02/19/20 Potential to Achieve Goals: Fair ADL Goals Pt Will Perform Grooming: with modified independence;sitting Pt Will Transfer to Toilet: with max assist;stand pivot transfer;bedside commode  Plan Discharge plan needs to be updated;Frequency remains appropriate       AM-PAC OT "6 Clicks" Daily Activity     Outcome Measure   Help from another person eating meals?: A Little Help from another person taking care of personal grooming?: A Little Help from another person toileting, which includes using toliet, bedpan, or urinal?: A Lot Help from another person bathing (including washing, rinsing, drying)?: A Lot Help from another person to put on and taking off regular upper body clothing?: A Little Help from another person to put on and taking off regular lower body clothing?: A Lot 6 Click Score: 15    End of Session    OT Visit Diagnosis: Other abnormalities of gait and mobility (R26.89)   Activity Tolerance Patient limited by pain   Patient Left in bed;with call bell/phone within reach;with bed alarm set;with family/visitor present   Nurse Communication          Time: 8588-5027 OT Time Calculation (min): 34 min  Charges: OT General Charges $OT Visit: 1 Visit OT Treatments $Self Care/Home Management : 8-22 mins $Therapeutic Activity: 8-22 mins  Dessie Coma, M.S. OTR/L  02/09/20, 2:27 PM  ascom [phone]

## 2020-02-10 DIAGNOSIS — E559 Vitamin D deficiency, unspecified: Secondary | ICD-10-CM | POA: Diagnosis not present

## 2020-02-10 DIAGNOSIS — G894 Chronic pain syndrome: Secondary | ICD-10-CM | POA: Diagnosis not present

## 2020-02-10 DIAGNOSIS — I639 Cerebral infarction, unspecified: Secondary | ICD-10-CM | POA: Diagnosis not present

## 2020-02-10 DIAGNOSIS — I480 Paroxysmal atrial fibrillation: Secondary | ICD-10-CM | POA: Diagnosis not present

## 2020-02-10 LAB — GLUCOSE, CAPILLARY
Glucose-Capillary: 136 mg/dL — ABNORMAL HIGH (ref 70–99)
Glucose-Capillary: 153 mg/dL — ABNORMAL HIGH (ref 70–99)

## 2020-02-10 LAB — COPPER, SERUM: Copper: 142 ug/dL (ref 80–158)

## 2020-02-10 MED ORDER — CYANOCOBALAMIN 1000 MCG PO TABS: 1000.0000 ug | ORAL_TABLET | Freq: Every day | ORAL | 2 refills | Status: DC

## 2020-02-10 MED ORDER — CALCIUM CARBONATE-VITAMIN D 500-200 MG-UNIT PO TABS: 1.0000 | ORAL_TABLET | Freq: Two times a day (BID) | ORAL | 0 refills | Status: AC

## 2020-02-10 MED ORDER — METHENAMINE MANDELATE 1 G PO TABS
1000.0000 mg | ORAL_TABLET | Freq: Two times a day (BID) | ORAL | 0 refills | Status: DC
Start: 2020-02-10 — End: 2020-03-01

## 2020-02-10 MED ORDER — VITAMIN D3 25 MCG PO TABS: 1000.0000 [IU] | ORAL_TABLET | Freq: Two times a day (BID) | ORAL | 2 refills | Status: DC

## 2020-02-10 MED ORDER — DICLOFENAC SODIUM 1 % EX GEL: 2.0000 g | Freq: Three times a day (TID) | CUTANEOUS | 2 refills | Status: AC

## 2020-02-10 MED ORDER — FOLIC ACID 1 MG PO TABS
1.0000 mg | ORAL_TABLET | Freq: Every day | ORAL | 2 refills | Status: DC
Start: 2020-02-11 — End: 2020-06-28

## 2020-02-10 MED ORDER — TRIAMCINOLONE ACETONIDE 40 MG/ML IJ SUSP
40.0000 mg | Freq: Once | INTRAMUSCULAR | Status: AC
  Administered 2020-02-10: 14:00:00 40 mg
  Filled 2020-02-10: qty 1

## 2020-02-10 MED ORDER — THIAMINE HCL 100 MG PO TABS
50.0000 mg | ORAL_TABLET | Freq: Two times a day (BID) | ORAL | 2 refills | Status: DC
Start: 2020-02-10 — End: 2022-08-19

## 2020-02-10 MED ORDER — ADULT MULTIVITAMIN W/MINERALS CH
1.0000 | ORAL_TABLET | Freq: Every day | ORAL | Status: DC
Start: 2020-02-11 — End: 2023-01-20

## 2020-02-10 MED ORDER — BUPIVACAINE HCL (PF) 0.5 % IJ SOLN
10.0000 mL | Freq: Once | INTRAMUSCULAR | Status: AC
  Administered 2020-02-10: 10 mL
  Filled 2020-02-10: qty 10

## 2020-02-10 NOTE — TOC Progression Note (Addendum)
Transition of Care [facility]) - Progression Note    Patient Details  Name: [name] MRN: [id number] Date of Birth: Dec 01, 1950  Transition of [facility]) CM/SW Contact  Truitt Merle, LCSW Phone Number: 02/10/2020, 11:36 AM  Clinical Narrative:    TOC-SW spoke with Lucretia at [place] [phone]) to inquire about hospital bed, hoyer lift, and wheelchair that was to be delivered this morning. Jeanella Anton stated she will look into and call back with an update.   UPDATE 2:10pm: Spoke with [place] who stated equipment would be delivered today.   Spoke with spouse [name] ([phone]) who stated [employer] is currently at the home now setting up DME. Set to be completed in within an hour.   Spoke with Tillie Rung at [facility] to update re: discharge today. Kendra awaiting discharge summary and asked to be notified when available. Updated attending Dr. Arbutus Ped and RN Cherlyn Labella.   UPDATE 4pm: D/C summary completed. Updated Tillie Rung with [facility]. Completed med necessity forms. RN Malka arranged EMS transport. No further TOC needs.   Expected Discharge Plan: [place] Barriers to Discharge: Equipment Delay  Expected Discharge Plan and Services Expected Discharge Plan: [place]   Discharge Planning Services: CM Consult Post Acute Care Choice: [place], Resumption of Svcs/PTA Provider Living arrangements for the past 2 months: [place]                 DME Arranged: Hospital bed, Lightweight manual wheelchair with seat cushion, Other see comment Harrel Lemon lift) DME Agency: [employer] Date DME Agency Contacted: 02/09/20 Time DME Agency Contacted: [id number] Representative spoke with at DME Agency: Mardene Celeste HH Arranged: RN, PT, OT, Nurse's Aide, Social Work [employer] Agency: [facility] Date [place]: 02/09/20 Time [place]: [phone] Representative spoke with at [place]: [place] ([place])  Interventions    Readmission Risk Interventions Readmission Risk Prevention Plan 07/30/2018  Transportation Screening Complete  PCP or Specialist Appt within 3-5 Days Complete  HRI or [place] Complete  Social Work Consult for [place] Planning/Counseling Patient refused  Palliative Care Screening Patient Refused  Medication Review Press photographer) Complete  Some recent data might be hidden

## 2020-02-10 NOTE — Progress Notes (Signed)
Occupational Therapy Treatment Patient Details Name: [name] MRN: [id number] DOB: 06-May-1950 Today's Date: 02/10/2020    History of present illness [name] is a 3yoF who comes to [facility] on 11/14 c Lt shoulder pain, bilat pedal pain, bladder spasm. Pt was recently at [place] at [facility], has been home for 9 days, initially getting around well, AMB in house with RW. Per RN, pt has not been taking lyrica, which pt says is for her neuropathy, formerly seeing pain management. PMH includes chronic left achilles wound followed by podiatry, chronic Left diabetic heel ulcer followed by podiatry, PVD, chronic left shoulder DJD/pain; Rt shoulder OA s/p surgery. Pt went to MRI late 11/15 found to have acute frontal lobe infarcts.   OT comments  [name] was seen for OT treatment on this date. Upon arrival to room pt agreeable to session. MIN A to exit R side of bed - assist for trunk control 2/2 LUE pain limiting. MAX A don B sock/shoes seated EOB. MIN A + RW for SPT bed>chair. Per request, pt attempted w/o assist, clears rear but unable to achieve upright posture ultimately requiring MIN A to stand. MIN A to manage RW for SPT bed>chair. Pt instructed on stroke lifestyle risk factors and verbalized understanding of instruction provided. Pt making good progress toward goals. Pt continues to benefit from skilled OT services to maximize return to PLOF and minimize risk of future falls, injury, caregiver burden, and readmission. Will continue to follow POC. Discharge recommendation remains appropriate.    Follow Up Recommendations  SNF    Equipment Recommendations  3 in 1 bedside commode;Hospital bed;Wheelchair (measurements OT)    Recommendations for Other Services      Precautions / Restrictions Precautions Precautions: Fall Restrictions Weight Bearing Restrictions: Yes Other Position/Activity Restrictions: Per Podiatry note: "Patient may be weightbearing as tolerated to the left foot but  needs to be wearing something to protect the posterior heel area from rubbing against shoe or boot and needs pressure relief on the plantar heel.  Recommended to physical therapy wearing a wedge shoe for forefoot ambulation."       Mobility Bed Mobility Overal bed mobility: Needs Assistance Bed Mobility: Supine to Sit     Supine to sit: Min assist;HOB elevated     General bed mobility comments: MIN A for trunk control to exit R side of bed - LUE pain limiting   Transfers Overall transfer level: Needs assistance Equipment used: Rolling walker (2 wheeled) Transfers: Sit to/from Stand Sit to Stand: Min assist;From elevated surface         General transfer comment: MIN A to achieve upright posture and stabilize RW     Balance Overall balance assessment: Needs assistance Sitting-balance support: No upper extremity supported;Feet supported Sitting balance-Leahy Scale: Good     Standing balance support: Bilateral upper extremity supported Standing balance-Leahy Scale: Fair Standing balance comment: pt requiring B UE support on RW for static standing balance        ADL either performed or assessed with clinical judgement   ADL Overall ADL's : Needs assistance/impaired      General ADL Comments: MAX A don B sock/shoes seated EOB. MIN A + RW for SPT bed>chair.                Cognition Arousal/Alertness: Awake/alert Behavior During Therapy: WFL for tasks assessed/performed Overall Cognitive Status: Within Functional Limits for tasks assessed             Exercises Exercises: Other  exercises Other Exercises Other Exercises: Pt educated re: DME recs, d/c recs, falls prevention, safe t/f technique, adapted dressing techniques, stroke education Other Exercises: LBD, sup>sit, sit<>stand, SPT, sitting/standing balance/tolerance, self-drinking           Pertinent Vitals/ Pain       Pain Assessment: Faces Faces Pain Scale: Hurts little more Pain Location: L upper  arm/shoulder Pain Descriptors / Indicators: Aching;Burning Pain Intervention(s): Limited activity within patient's tolerance         Frequency  Min 1X/week        Progress Toward Goals  OT Goals(current goals can now be found in the care plan section)  Progress towards OT goals: Progressing toward goals  Acute Rehab OT Goals Patient Stated Goal: improve pain in L UE to tolerate transfers OT Goal Formulation: With patient Time For Goal Achievement: 02/19/20 Potential to Achieve Goals: Fair ADL Goals Pt Will Perform Grooming: with modified independence;sitting Pt Will Transfer to Toilet: with max assist;stand pivot transfer;bedside commode  Plan Discharge plan remains appropriate;Frequency remains appropriate       AM-PAC OT "6 Clicks" Daily Activity     Outcome Measure   Help from another person eating meals?: None Help from another person taking care of personal grooming?: A Little Help from another person toileting, which includes using toliet, bedpan, or urinal?: A Lot Help from another person bathing (including washing, rinsing, drying)?: A Lot Help from another person to put on and taking off regular upper body clothing?: A Little Help from another person to put on and taking off regular lower body clothing?: A Lot 6 Click Score: 16    End of Session Equipment Utilized During Treatment: Rolling walker  OT Visit Diagnosis: Other abnormalities of gait and mobility (R26.89)   Activity Tolerance Patient tolerated treatment well   Patient Left in chair;with call bell/phone within reach;with chair alarm set   Nurse Communication Mobility status        Time: 1001-1029 OT Time Calculation (min): 28 min  Charges: OT General Charges $OT Visit: 1 Visit OT Treatments $Self Care/Home Management : 23-37 mins  Dessie Coma, M.S. OTR/L  02/10/20, 12:37 PM  ascom [phone]

## 2020-02-10 NOTE — Discharge Summary (Signed)
Physician Discharge Summary  [name] FFM:[id number] DOB: 1950/05/09 DOA: 02/04/2020  PCP: [name], [name], MD  Admit date: 02/04/2020 Discharge date: 02/10/2020  Admitted From: home Disposition:  home  Recommendations for Outpatient Follow-up:  1. Follow up with PCP in 1-2 weeks 2. Please obtain BMP/CBC in one week 3. Please follow up with Dr. Roland Rack, orthopedics, for left shoulder pain if needed 4. Follow up with podiatry in 2 weeks. 5. Follow up with neurology for neuropathy and recurrent strokes 6. Follow up with vascular surgery in 1-2 weeks to check circulation to lower extremities given nonhealing left Achilles wound  Home Health: PT, OT, RN, Aide, SW  Equipment/Devices: Hospital bed, [facility] lift, wheelchair  Discharge Condition: Stable  CODE STATUS: Full  Diet recommendation: Heart Healthy / Carb Modified    Discharge Diagnoses: Principal Problem:   Stroke [facility]) Active Problems:   Recurrent falls   Acute embolic stroke ([place])   Folate deficiency   Vitamin D deficiency   Paroxysmal atrial fibrillation ([place]); CHA2DS2-Vasc Score 5. On Pradaxa   Essential hypertension   DM (diabetes mellitus) type II, controlled, with peripheral vascular disorder ([place])   Hypertension associated with diabetes ([place])   Malnutrition of moderate degree   Sleep apnea   Hypothyroidism, unspecified   Gastro-esophageal reflux disease without esophagitis   Mass of both adrenal glands ([place])   Morbid obesity with BMI of 40.0-44.9, adult (HCC)   Chronic pain syndrome    Summary of HPI and Hospital Course:  HPI from Dr Cherrie Distance is a 69 y.o. adult with medical history significant of recent admission for acute stroke, paroxysmal atrial fibrillation on Eliquis, DM2, morbid obesity, chronic pain syndrome, previous DVT, sleep apnea on CPAP, presented with increased confusion, worsening left-sided weakness, falls from home on 02/04/2020. Patient was also endorsing increased  pain throughout in her left shoulder and feet. Reported bladder spasms, bladder incontinence, family initially was suspected UTI, but later reported that when patient had similar presentation, she was found to have a CVA. Last admit was a month ago for stroke of which her Pradaxa was switched to Eliquis. Pt has ASA allergy.  Patient was discharged to SNF and eventually discharged home.  In the ED, MRI showed new CVA, patient also managed for possible UTI.  Patient admitted for further management.     Recurrent stroke / Acute cortical infarct of posterior left frontal lobe - POA, with recent stroke in Sept this year.  Pt had just returned home from rehab about 8-10 days prior to this admission.  Has been compliance with anticoagulation for A-fib.   Echo in 6/21 showed EF of 60 to 65%, no regional wall motion abnormality,no atrial level shunt detected by color-flow Doppler. LDL at goal 68.  Hbg A1c is 7.1%.   Neurology was consulted and suspects recurrent strokes due to her severe malnutrition given hx of gastric sleeve and pt not taking vitamin supplements as prescribed (vit B12 deficiency can lead to hypercoagulable state, see neurology note for reference on this).  Wanted to rule out malignancy as cause of hypercoagulable state.  CT chest/abd/pelvis 11/17 negative for malignancies (did show chronic and stable adrenal masses). --Continue Eliquis --PT/OT - SNF recommended but patient declines.  Home health services arranged and necessary DME delivered to home including hospital bed and hoyer lift. --Strict compliance with vitamin supplements --IV iron given per neuro on 11/18  Left shoulder pain - Currently limited patient's ability to use walker.  Consulted orthopedics prior to discharge.  Dr. Roland Rack did bedside Kenalog injection today.   Follow up with him as needed.  Home health therapy as pt declines SNF as recommended.  Abnormal UA - on admission UA showed large leukocytes, few bacteria, greater  than 50 WBC. Cultlure grew multiple species, likely contamination.  Pt asymptomatic.  She was given fosfomycin on 02/05/20.    Pt remains asymptomatic.  Started on methenamine.  Left Achilles Wound - podiatry was consulted, follows as outpatient.   Per Dr. Vickki Muff 11/20: "F/u left achilles wound.  Stable at this time.  No drainage.  Looks to be very superficial at this time. Recommend continue with pressure reducing posterior heel pad 3 x/s per week. Can d/c betadine.   Cleanse wound with saline and recover 3 x's per week with prevalon type padded heel dressing. F/U with podiatry in 2 weeks" Vascular was consulted at request of podiatry and recommend outpatient follow up in 1-2 weeks for repeat ABI's (this and prior angiogram in 2019 showed good flow).   --wound care, daily dressing changes per instructions below --off-load heels --wt bearing as tolerated on LLE, with wedge shoe recommended for forefoot ambulation  Hypertension - chronic, stable.  Meds were initially held for permissive HTN.  Resumed on amlodipine.   Hyzaar held at time of discharge to prevent hypotension as BP's have been normal for the most part.  PCP follow and resume Hyzaar when BP will tolerate.  Paroxysmal A-fib - rate controlled.  Continue Eliquis and metoprolol.  Type 2 Diabetes with Polyneuropathy - Hbg A1c is 7.1%, poorly controlled.  Resume home metformin and Januvia at discharge. Covered with sliding scale Novolog during admission.   Continue Lyrica and B12 supplementation for neuropathy.  Unsteady Gait - with intolerance for weight-bearing on her feet.  This seems primarily due severe neuropathy.  Continue Lyrica, B12 supplementation as below.  PT/OT to follow.    Vitamin B12 deficiency - likely contributes to neuropathy, secondary to malnutrition s/p gastric sleeve and noncompliance with vitamin supplements.  Continue IM B12 injections daily x 7 days, then PO replacement. --Follow up pending labs: thiamine,  B6, heavy metals screen.   --Consider outpatient EMC/NCS if persistent symptoms after B12 repletion.  Folic acid deficiency - continue oral supplement.  Vitamin D deficiency - continue oral supplement.  Hypothyroidism - continue Synthroid  Obstructive Sleep Apnea - CPAP ordered  Normocytic anemia - is likely multifactorial with B12 deficiency contributing.  Iron studies with AM labs.  Depression / Anxiety - continue Wellbutrin, Cymbalta PRN Xanax   Obesity: Body mass index is 39.99 kg/m.   Complicates overall care and prognosis.  Recommend lifestyle modifications.        Discharge Instructions   Discharge Instructions    Call MD for:   Complete by: As directed    New or worsening neurologic deficits like weakness on one side, trouble speaking swallowing or understanding what others are saying, worsening confusion.   Call MD for:  extreme fatigue   Complete by: As directed    Call MD for:  persistant dizziness or light-headedness   Complete by: As directed    Call MD for:  persistant nausea and vomiting   Complete by: As directed    Call MD for:  severe uncontrolled pain   Complete by: As directed    Call MD for:  temperature >100.4   Complete by: As directed    Diet - low sodium heart healthy   Complete by: As directed    Discharge instructions  Complete by: As directed    Very important to take all vitamin supplements exactly as prescribed.   You had vitamin D (ergocalciferol) on your home med list, 50,000 units once weekly.   You can continue this, or take daily 1000 units which I sent a prescription for.  You do not need to take both.  We often do the 50000 units weekly until your vitamin D level is above 30.  Yours was 26.5, so okay to continue the high dose and start on 1000 units daily after the level is rechecked by primary care.  Please follow up with Dr. Roland Rack regarding your left shoulder pain, and of course participate with PT and ask them for  exercises you can do to improve that pain.   Discharge wound care:   Complete by: As directed    Instruction as above.   Increase activity slowly   Complete by: As directed      Allergies as of 02/10/2020      Reactions   Morphine And Related Other (See Comments), Nausea Only   Patient becomes very confused Other reaction(s): Other (See Comments), Other (See Comments) Patient becomes very confused Patient becomes very confused   Penicillins Hives, Shortness Of Breath, Swelling, Other (See Comments)   Facial swelling Has patient had a PCN reaction causing immediate rash, facial/tongue/throat swelling, SOB or lightheadedness with hypotension: Yes Has patient had a PCN reaction causing severe rash involving mucus membranes or skin necrosis: Yes Has patient had a PCN reaction that required hospitalization No Has patient had a PCN reaction occurring within the last 10 years: No If all of the above answers are "NO", then may proceed with Cephalosporin use. Other reaction(s): Other (See Comments), Other (See Comments) Facial swelling Has patient had a PCN reaction causing immediate rash, facial/tongue/throat swelling, SOB or lightheadedness with hypotension: Yes Has patient had a PCN reaction causing severe rash involving mucus membranes or skin necrosis: Yes Has patient had a PCN reaction that required hospitalization No Has patient had a PCN reaction occurring within the last 10 years: No If all of the above answers are "NO", then may proceed with Cephalosporin use. Facial swelling Has patient had a PCN reaction causing immediate rash, facial/tongue/throat swelling, SOB or lightheadedness with hypotension: Yes Has patient had a PCN reaction causing severe rash involving mucus membranes or skin necrosis: Yes Has patient had a PCN reaction that required hospitalization No Has patient had a PCN reaction occurring within the last 10 years: No If all of the above answers are "NO", then may  proceed with Cephalosporin use. Facial swelling Has patient had a PCN reaction causing immediate rash, facial/tongue/throat swelling, SOB or lightheadedness with hypotension: Yes Has patient had a PCN reaction causing severe rash involving mucus membranes or skin necrosis: Yes Has patient had a PCN reaction that required hospitalization No Has patient had a PCN reaction occurring within the last 10 years: No If all of the above answers are "NO", then may proceed with Cephalosporin use.   Ambien [zolpidem]    Hallucination    Aspirin Hives   Oxytetracycline Hives   Other reaction(s): Unknown      Medication List    TAKE these medications   acetaminophen 325 MG tablet Commonly known as: TYLENOL Take 2 tablets (650 mg total) by mouth every 6 (six) hours as needed for mild pain or moderate pain (or Fever >/= 101).   Advair HFA 115-21 MCG/ACT inhaler Generic drug: fluticasone-salmeterol Inhale 2 puffs into the lungs  daily. Rinse mouth thoroughly after use   albuterol (2.5 MG/3ML) 0.083% nebulizer solution Commonly known as: PROVENTIL Take 2.5 mg by nebulization every 4 (four) hours as needed for wheezing or shortness of breath. What changed: Another medication with the same name was removed. Continue taking this medication, and follow the directions you see here.   ALPRAZolam 0.5 MG tablet Commonly known as: XANAX Take 0.5 mg by mouth at bedtime as needed for anxiety or sleep.   amLODipine 5 MG tablet Commonly known as: NORVASC Take 1 tablet (5 mg total) by mouth daily.   apixaban 5 MG Tabs tablet Commonly known as: ELIQUIS Take 1 tablet (5 mg total) by mouth 2 (two) times daily.   buPROPion 150 MG 24 hr tablet Commonly known as: WELLBUTRIN XL Take 1 tablet (150 mg total) by mouth daily.   calcium-vitamin D 500-200 MG-UNIT tablet Commonly known as: OSCAL WITH D Take 1 tablet by mouth 2 (two) times daily with a meal.   clobetasol cream 0.05 % Commonly known as:  TEMOVATE Apply 1 application topically 2 (two) times daily as needed (skin irritation).   colchicine 0.6 MG tablet Take 0.6 mg by mouth 2 (two) times daily as needed (acute gout flare).   cyanocobalamin 1000 MCG tablet Take 1 tablet (1,000 mcg total) by mouth daily. Start taking on: February 13, 2020   diclofenac Sodium 1 % Gel Commonly known as: VOLTAREN Apply 2 g topically 3 (three) times daily.   Ditropan XL 10 MG 24 hr tablet Generic drug: oxybutynin Take 10 mg by mouth at bedtime.   DULoxetine 60 MG capsule Commonly known as: CYMBALTA Take 60 mg by mouth daily.   ergocalciferol 1.25 MG (50000 UT) capsule Commonly known as: VITAMIN D2 Take 50,000 Units by mouth once a week.   estradiol 0.1 MG/GM vaginal cream Commonly known as: ESTRACE Place 1 Applicatorful vaginally every Monday, Wednesday, and Friday. Apply 0.5mg  (pea-sized amount)  just inside the vaginal introitus with a finger-tip on Monday, Wednesday and Friday nights,   folic acid 1 MG tablet Commonly known as: FOLVITE Take 1 tablet (1 mg total) by mouth daily. Start taking on: February 11, 2020   HYDROcodone-acetaminophen 5-325 MG tablet Commonly known as: NORCO/VICODIN Take 1 tablet by mouth every 8 (eight) hours as needed for severe pain. Must last 30 days   levocetirizine 5 MG tablet Commonly known as: XYZAL Take 0.5 tablets (2.5 mg total) by mouth every evening. What changed:   how much to take  when to take this  reasons to take this   levothyroxine 175 MCG tablet Commonly known as: SYNTHROID Take 1 tablet (175 mcg total) by mouth daily before breakfast.   losartan-hydrochlorothiazide 50-12.5 MG tablet Commonly known as: HYZAAR Take 1 tablet by mouth daily.   metFORMIN 500 MG tablet Commonly known as: GLUCOPHAGE Take 1 tablet (500 mg total) by mouth 2 (two) times daily with a meal. With food   methenamine 1 g tablet Commonly known as: MANDELAMINE Take 1 tablet (1,000 mg total) by mouth  2 (two) times daily.   metoprolol succinate 50 MG 24 hr tablet Commonly known as: TOPROL-XL TAKE 1 TABLET BY MOUTH DAILY What changed:   how much to take  how to take this  when to take this  additional instructions   montelukast 10 MG tablet Commonly known as: SINGULAIR Take 1 tablet (10 mg total) by mouth at bedtime.   multivitamin with minerals Tabs tablet Take 1 tablet by mouth daily. Start taking  on: February 11, 2020   ondansetron 4 MG tablet Commonly known as: Zofran Take 1 tablet (4 mg total) by mouth every 8 (eight) hours as needed for nausea or vomiting.   pantoprazole 40 MG tablet Commonly known as: PROTONIX Take 40 mg by mouth daily.   pregabalin 50 MG capsule Commonly known as: LYRICA Take 1 capsule (50 mg total) by mouth 3 (three) times daily.   rosuvastatin 20 MG tablet Commonly known as: CRESTOR Take 1 tablet (20 mg total) by mouth daily.   sitaGLIPtin 25 MG tablet Commonly known as: Januvia Take 1 tablet (25 mg total) by mouth daily.   sucralfate 1 GM/10ML suspension Commonly known as: CARAFATE Take 1 g by mouth 3 (three) times daily.   thiamine 100 MG tablet Take 0.5 tablets (50 mg total) by mouth 2 (two) times daily with a meal.   Toviaz 8 MG Tb24 tablet Generic drug: fesoterodine TAKE ONE TABLET BY MOUTH EVERY DAY What changed: how much to take   trospium 20 MG tablet Commonly known as: SANCTURA Take 20 mg by mouth 2 (two) times daily.   Vitamin D3 25 MCG tablet Commonly known as: Vitamin D Take 1 tablet (1,000 Units total) by mouth 2 (two) times daily with a meal.            Durable Medical Equipment  (From admission, onward)         Start     Ordered   02/09/20 1246  For home use only DME lightweight manual wheelchair with seat cushion  Once       Comments: Patient suffers from stroke and chronic pain syndrome which impairs their ability to perform daily activities like bathing, dressing, walking and preparing meals in  the home.  A walker will not resolve  issue with performing activities of daily living. A wheelchair will allow patient to safely perform daily activities. Patient is not able to propel themselves in the home using a standard weight wheelchair due to weakness and pain. Patient can self propel in the lightweight wheelchair. Length of need lifetime. Accessories: elevating leg rests (ELRs), wheel locks, extensions and anti-tippers, seat cushion and back cushion.   02/09/20 1247   02/09/20 1245  For home use only DME Hospital bed  Once       Question Answer Comment  Length of Need Lifetime   Patient has (list medical condition): CVA and chronic pain syndrome   The above medical condition requires: Patient requires the ability to reposition frequently   Head must be elevated greater than: 30 degrees   Bed type Semi-electric   Hoyer Lift Yes   Trapeze Bar Yes   Support Surface: Gel Overlay      02/09/20 1247           Discharge Care Instructions  (From admission, onward)         Start     Ordered   02/10/20 0000  Discharge wound care:       Comments: Instruction as above.   02/10/20 1421          Follow-up Information    Kris Hartmann, NP In 2 weeks.   Specialty: Vascular Surgery Why: with ABIs Contact information: [place] [address] [phone]              Allergies  Allergen Reactions  . Morphine And Related Other (See Comments) and Nausea Only    Patient becomes very confused Other reaction(s): Other (See Comments), Other (  See Comments) Patient becomes very confused Patient becomes very confused   . Penicillins Hives, Shortness Of Breath, Swelling and Other (See Comments)    Facial swelling Has patient had a PCN reaction causing immediate rash, facial/tongue/throat swelling, SOB or lightheadedness with hypotension: Yes Has patient had a PCN reaction causing severe rash involving mucus membranes or skin necrosis: Yes Has patient had a PCN  reaction that required hospitalization No Has patient had a PCN reaction occurring within the last 10 years: No If all of the above answers are "NO", then may proceed with Cephalosporin use.  Other reaction(s): Other (See Comments), Other (See Comments) Facial swelling Has patient had a PCN reaction causing immediate rash, facial/tongue/throat swelling, SOB or lightheadedness with hypotension: Yes Has patient had a PCN reaction causing severe rash involving mucus membranes or skin necrosis: Yes Has patient had a PCN reaction that required hospitalization No Has patient had a PCN reaction occurring within the last 10 years: No If all of the above answers are "NO", then may proceed with Cephalosporin use. Facial swelling Has patient had a PCN reaction causing immediate rash, facial/tongue/throat swelling, SOB or lightheadedness with hypotension: Yes Has patient had a PCN reaction causing severe rash involving mucus membranes or skin necrosis: Yes Has patient had a PCN reaction that required hospitalization No Has patient had a PCN reaction occurring within the last 10 years: No If all of the above answers are "NO", then may proceed with Cephalosporin use. Facial swelling Has patient had a PCN reaction causing immediate rash, facial/tongue/throat swelling, SOB or lightheadedness with hypotension: Yes Has patient had a PCN reaction causing severe rash involving mucus membranes or skin necrosis: Yes Has patient had a PCN reaction that required hospitalization No Has patient had a PCN reaction occurring within the last 10 years: No If all of the above answers are "NO", then may proceed with Cephalosporin use.   . Ambien [Zolpidem]     Hallucination   . Aspirin Hives  . Oxytetracycline Hives    Other reaction(s): Unknown     Consultations:  Neurology  Podiatry  Vascular surgery  Orthopedics    Procedures/Studies: CT Head Wo Contrast  Result Date: 02/04/2020 CLINICAL DATA:  New  onset of headache. EXAM: CT HEAD WITHOUT CONTRAST TECHNIQUE: Contiguous axial images were obtained from the base of the skull through the vertex without intravenous contrast. COMPARISON:  December 21, 2019 head CT December 22, 2019, head MRI FINDINGS: Brain: No evidence of acute infarction, hemorrhage, hydrocephalus, extra-axial collection or mass lesion/mass effect. Advanced deep white matter microangiopathy. Mild brain parenchymal volume loss. Stable areas of hypoattenuation when compared to patient's most recent brain MRI, reflecting chronic ischemic infarctions, the largest in the right centrum semiovale. Vascular: No hyperdense vessel or unexpected calcification. Skull: Normal. Negative for fracture or focal lesion. Sinuses/Orbits: No acute finding. Other: None. IMPRESSION: 1. No acute intracranial abnormality. 2. Advanced deep white matter microangiopathy. 3. Stable areas of hypoattenuation when compared to patient's most recent brain MRI, reflecting chronic ischemic infarctions, the largest in the right centrum semiovale. Electronically Signed   By: Fidela Salisbury M.D.   On: 02/04/2020 17:27   MR BRAIN WO CONTRAST  Result Date: 02/05/2020 CLINICAL DATA:  Neuro deficit, acute, stroke suspected. EXAM: MRI HEAD WITHOUT CONTRAST TECHNIQUE: Multiplanar, multiecho pulse sequences of the brain and surrounding structures were obtained without intravenous contrast. COMPARISON:  Noncontrast head CT 02/04/2020. MRI/MRA head 12/22/2019. FINDINGS: Brain: Mild-to-moderate generalized cerebral atrophy. New from the prior MRI of 12/22/2019, there is  a 3 mm cortical focus of restricted diffusion within the posterior left frontal lobe consistent with acute infarction (series 9, image 34). Also new from this prior exam, there is a 2 mm acute infarct within the left frontal lobe periventricular white matter (series 9, image 32). Unchanged small scattered chronic cortical infarcts, most notably within the right parietal  lobe (series 15, image 35). Background severe scattered and confluent T2/FLAIR hyperintensity within the cerebral white matter which is nonspecific, but compatible with chronic small vessel ischemic disease. This includes multiple chronic cerebral white matter small vessel infarcts. A chronic small-vessel infarct within the left centrum semiovale is new as compared to the prior MRI (series 9, image 33). Prominent perivascular spaces within the bilateral basal ganglia with likely superimposed chronic lacunar infarcts, unchanged. Redemonstrated chronic lacunar infarcts within the bilateral thalami. Comparatively mild chronic small vessel ischemic changes within the pons. There are few nonspecific scattered supratentorial chronic microhemorrhages, unchanged. No evidence of intracranial mass. No extra-axial fluid collection. No midline shift. Vascular: Expected proximal arterial flow voids. Skull and upper cervical spine: No focal marrow lesion. Sinuses/Orbits: Visualized orbits show no acute finding. Minimal left maxillary sinus mucosal thickening at the imaged levels. Trace left mastoid effusion. IMPRESSION: 3 mm acute cortical infarct within the posterior left frontal lobe. 2 mm acute infarct within the left frontal lobe periventricular white matter. Chronic small-vessel infarct within the left centrum semiovale, new as compared to MRI of 12/22/2019. Cerebral atrophy and severe chronic small vessel ischemic disease with multiple chronic infarcts, as detailed and otherwise unchanged. Electronically Signed   By: Kellie Simmering DO   On: 02/05/2020 18:49   CT CHEST ABDOMEN PELVIS W CONTRAST  Result Date: 02/07/2020 CLINICAL DATA:  69 year old female with delirium and weight loss. Concern for malignancy. EXAM: CT CHEST, ABDOMEN, AND PELVIS WITH CONTRAST TECHNIQUE: Multidetector CT imaging of the chest, abdomen and pelvis was performed following the standard protocol during bolus administration of intravenous contrast.  CONTRAST:  176mL OMNIPAQUE IOHEXOL 300 MG/ML  SOLN COMPARISON:  Chest CT dated 09/02/2019. FINDINGS: CT CHEST FINDINGS Cardiovascular: There is no cardiomegaly or pericardial effusion. There is calcification of the mitral annulus. The thoracic aorta is unremarkable. The origins of the great vessels of the aortic arch appear patent. There is common origin of the right brachiocephalic trunk and left common carotid artery. The central pulmonary arteries patent. Mediastinum/Nodes: No hilar or mediastinal adenopathy. There is a small hiatal hernia. The esophagus is grossly unremarkable. No mediastinal fluid collection. Lungs/Pleura: There is mild eventration of right hemidiaphragm with right lung base atelectasis. There is a cluster of nodularity in the left upper lobe anteriorly, likely chronic and sequela of prior inflammation/infection. No new consolidation. There is no pleural effusion pneumothorax. The central airways are patent. Musculoskeletal: Right shoulder arthroplasty. No acute osseous pathology. Degenerative changes of spine. CT ABDOMEN PELVIS FINDINGS No intra-abdominal free air or free fluid. Hepatobiliary: The liver is unremarkable. No intrahepatic biliary dilatation. Cholecystectomy. No retained calcified stone noted in the central CBD. Pancreas: Unremarkable. No pancreatic ductal dilatation or surrounding inflammatory changes. Spleen: Normal in size without focal abnormality. Adrenals/Urinary Tract: Large bilateral adrenal masses demonstrating heterogeneous attenuation with predominantly fat content measuring 8.2 x 8.6 cm on the left and 8.6 x 6.0 cm on the right. Findings may represent adrenal myelolipoma, although fat containing adrenocortical carcinoma is not excluded. Clinical correlation and further characterization with MRI without and with contrast (adrenal mass protocol MRI) is recommended. There is mild bilateral renal parenchyma atrophy. There is  no hydronephrosis on either side. There is  symmetric enhancement and excretion of contrast by both kidneys. There is a 5 mm nonobstructing stone in the left renal pelvis. The visualized ureters and urinary bladder appear unremarkable. Stomach/Bowel: Postsurgical changes of gastric sleeve. There is no bowel obstruction or active inflammation. The appendix is normal. Vascular/Lymphatic: Minimal atherosclerotic calcification of the aorta. The IVC is unremarkable. No portal venous gas. There is no adenopathy. Reproductive: Hysterectomy. No adnexal masses. Other: None Musculoskeletal: Osteopenia with degenerative changes of the spine. No acute osseous pathology. IMPRESSION: 1. No acute intrathoracic, abdominal, or pelvic pathology. 2. Large bilateral adrenal masses as described. Findings may represent adrenal myelolipoma, although fat containing adrenocortical carcinoma is not excluded. Clinical correlation and further characterization with adrenal mass protocol MRI is recommended. 3. Aortic Atherosclerosis (ICD10-I70.0). Electronically Signed   By: Anner Crete M.D.   On: 02/07/2020 19:56   DG Chest Port 1 View  Result Date: 02/05/2020 CLINICAL DATA:  Left-sided arm weakness and bilateral lower extremity weakness since Friday EXAM: PORTABLE CHEST 1 VIEW COMPARISON:  Radiograph 12/19/2019 FINDINGS: Chronic elevation of the right hemidiaphragm with some adjacent opacities, likely reflecting chronic scarring and/or atelectasis. There is some mild vascular congestion. Airways thickening is present as well, possibly cuffing versus bronchitis. No pneumothorax. No effusion. No focal consolidative opacity. Cardiomediastinal contours are unremarkable. Prior reverse right shoulder arthroplasty. Degenerative changes in the acromioclavicular joints and left glenohumeral joint as well. Multilevel degenerative changes are present in the imaged portions of the spine. Remaining soft tissues are free of acute abnormality. Telemetry leads overlie the chest. IMPRESSION:  1. Mild vascular congestion. Airways thickening, possibly cuffing to suggest some early interstitial edema versus bronchitic change. 2. Chronic elevation of the right hemidiaphragm with adjacent chronic opacity, likely atelectasis and scarring. Electronically Signed   By: Lovena Le M.D.   On: 02/05/2020 21:54       Subjective: Pt seen this AM at bedside.  Reports feeling okay.  Left shoulder continues to cause a lot of pain, she was in tears after OT did range of motion yesterday.  Voltaren gel does help.  Says left shoulder feels much like the right before joint replacement surgery years ago with Dr. Roland Rack.     Discharge Exam: Vitals:   02/10/20 0900 02/10/20 1140  BP:  (!) 147/72  Pulse: 62 (!) 58  Resp:  20  Temp:  97.6 F (36.4 C)  SpO2:  97%   Vitals:   02/10/20 0601 02/10/20 0722 02/10/20 0900 02/10/20 1140  BP: 134/62 (!) 142/89  (!) 147/72  Pulse: (!) 55 (!) 59 62 (!) 58  Resp: 16 19  20   Temp: (!) 97.4 F (36.3 C) (!) 97.5 F (36.4 C)  97.6 F (36.4 C)  TempSrc:  Axillary  Oral  SpO2: 97% 98%  97%  Weight:      Height:        General: Pt is alert, awake, not in acute distress, obese Cardiovascular: RRR, S1/S2 +, no rubs, no gallops Respiratory: CTA bilaterally, no wheezing, no rhonchi Abdominal: Soft, NT, ND, bowel sounds + Extremities: no edema, no cyanosis, left achilles with protective padded bandage in place    The results of significant diagnostics from this hospitalization (including imaging, microbiology, ancillary and laboratory) are listed below for reference.     Microbiology: Recent Results (from the past 240 hour(s))  Urine culture     Status: Abnormal   Collection Time: 02/05/20  3:41 AM   Specimen: Urine, Random  Result Value Ref Range Status   Specimen Description   Final    URINE, RANDOM Performed at [facility], [address] [place], [place] [address]    Special Requests   Final    NONE Performed at [facility], [address] [place], [place] [address]    Culture MULTIPLE SPECIES PRESENT, SUGGEST RECOLLECTION (A)  Final   Report Status 02/06/2020 FINAL  Final  Respiratory Panel by RT PCR (Flu A&B, Covid) - Nasopharyngeal Swab     Status: None   Collection Time: 02/05/20  3:41 AM   Specimen: Nasopharyngeal Swab  Result Value Ref Range Status   SARS Coronavirus 2 by RT PCR NEGATIVE NEGATIVE Final    Comment: (NOTE) SARS-CoV-2 target nucleic acids are NOT DETECTED.  The SARS-CoV-2 RNA is generally detectable in upper respiratoy specimens during the acute phase of infection. The lowest concentration of SARS-CoV-2 viral copies this assay can detect is 131 copies/mL. A negative result does not preclude SARS-Cov-2 infection and should not be used as the sole basis for treatment or other patient management decisions. A negative result may occur with  improper specimen collection/handling, submission of specimen other than nasopharyngeal swab, presence of viral mutation(s) within the areas targeted by this assay, and inadequate number of viral copies (<131 copies/mL). A negative result must be combined with clinical observations, patient history, and epidemiological information. The expected result is Negative.  Fact Sheet for Patients:  PinkCheek.be  Fact Sheet for Healthcare Providers:  GravelBags.it  This test is no t yet approved or cleared by the [place] FDA and  has been authorized for detection and/or diagnosis of SARS-CoV-2 by FDA under an Emergency Use Authorization (EUA). This EUA will remain  in effect (meaning this test can be used) for the duration of the COVID-19 declaration under Section 564(b)(1) of the Act, 21 U.S.C. section 360bbb-3(b)(1), unless the authorization is terminated or revoked sooner.     Influenza A by PCR NEGATIVE NEGATIVE Final   Influenza B by PCR NEGATIVE NEGATIVE Final    Comment:  (NOTE) The Xpert Xpress SARS-CoV-2/FLU/RSV assay is intended as an aid in  the diagnosis of influenza from Nasopharyngeal swab specimens and  should not be used as a sole basis for treatment. Nasal washings and  aspirates are unacceptable for Xpert Xpress SARS-CoV-2/FLU/RSV  testing.  Fact Sheet for Patients: PinkCheek.be  Fact Sheet for Healthcare Providers: GravelBags.it  This test is not yet approved or cleared by the [place] FDA and  has been authorized for detection and/or diagnosis of SARS-CoV-2 by  FDA under an Emergency Use Authorization (EUA). This EUA will remain  in effect (meaning this test can be used) for the duration of the  Covid-19 declaration under Section 564(b)(1) of the Act, 21  U.S.C. section 360bbb-3(b)(1), unless the authorization is  terminated or revoked. Performed at [facility], [address] [place], [place] [address]      Labs: BNP (last 3 results) Recent Labs    09/02/19 1044  BNP 914.7*   Basic Metabolic Panel: Recent Labs  Lab 02/04/20 1645 02/07/20 0445 02/09/20 0412  NA 140 139 140  K 4.4 4.7 4.5  CL 102 101 103  CO2 30 28 28   GLUCOSE 143* 144* 131*  BUN 16 18 21   CREATININE 1.01* 0.96 0.99  CALCIUM 8.9 8.9 9.3  MG  --   --  2.0   Liver Function Tests: No results for input(s): AST, ALT, ALKPHOS, BILITOT, PROT, ALBUMIN  in the last 168 hours. No results for input(s): LIPASE, AMYLASE in the last 168 hours. Recent Labs  Lab 02/07/20 0446  AMMONIA <9*   CBC: Recent Labs  Lab 02/04/20 1645 02/07/20 0445 02/08/20 0557  WBC 7.0 4.1 5.2  NEUTROABS 5.3 2.3  --   HGB 10.4* 9.6* 10.5*  HCT 33.2* 30.5*  31.2* 33.8*  MCV 82.2 82.7 83.0  PLT 238 256 268   Cardiac Enzymes: No results for input(s): CKTOTAL, CKMB, CKMBINDEX, TROPONINI in the last 168 hours. BNP: Invalid input(s): POCBNP CBG: Recent Labs  Lab 02/09/20 1206 02/09/20 1554  02/09/20 2025 02/10/20 0726 02/10/20 1139  GLUCAP 151* 164* 138* 136* 153*   D-Dimer No results for input(s): DDIMER in the last 72 hours. Hgb A1c No results for input(s): HGBA1C in the last 72 hours. Lipid Profile No results for input(s): CHOL, HDL, LDLCALC, TRIG, CHOLHDL, LDLDIRECT in the last 72 hours. Thyroid function studies No results for input(s): TSH, T4TOTAL, T3FREE, THYROIDAB in the last 72 hours.  Invalid input(s): FREET3 Anemia work up Recent Labs    02/08/20 0557  FERRITIN 29  TIBC 409  IRON 34   Urinalysis    Component Value Date/Time   COLORURINE YELLOW (A) 02/04/2020 1537   APPEARANCEUR CLOUDY (A) 02/04/2020 1537   APPEARANCEUR Clear 09/18/2019 1437   LABSPEC 1.011 02/04/2020 1537   PHURINE 7.0 02/04/2020 1537   GLUCOSEU NEGATIVE 02/04/2020 1537   GLUCOSEU NEGATIVE 08/02/2017 1427   HGBUR SMALL (A) 02/04/2020 1537   BILIRUBINUR NEGATIVE 02/04/2020 1537   BILIRUBINUR Negative 09/18/2019 1437   BILIRUBINUR 1+ 09/15/2017 1319   KETONESUR NEGATIVE 02/04/2020 1537   PROTEINUR NEGATIVE 02/04/2020 1537   UROBILINOGEN 0.2 09/15/2017 1319   UROBILINOGEN 0.2 08/02/2017 1427   NITRITE NEGATIVE 02/04/2020 1537   LEUKOCYTESUR LARGE (A) 02/04/2020 1537   Sepsis Labs Invalid input(s): PROCALCITONIN,  WBC,  LACTICIDVEN Microbiology Recent Results (from the past 240 hour(s))  Urine culture     Status: Abnormal   Collection Time: 02/05/20  3:41 AM   Specimen: Urine, Random  Result Value Ref Range Status   Specimen Description   Final    URINE, RANDOM Performed at [facility], [address], [place], [place] [address]    Special Requests   Final    NONE Performed at [facility], [address] [place], [place] [address]    Culture MULTIPLE SPECIES PRESENT, SUGGEST RECOLLECTION (A)  Final   Report Status 02/06/2020 FINAL  Final  Respiratory Panel by RT PCR (Flu A&B, Covid) - Nasopharyngeal Swab     Status: None   Collection Time:  02/05/20  3:41 AM   Specimen: Nasopharyngeal Swab  Result Value Ref Range Status   SARS Coronavirus 2 by RT PCR NEGATIVE NEGATIVE Final    Comment: (NOTE) SARS-CoV-2 target nucleic acids are NOT DETECTED.  The SARS-CoV-2 RNA is generally detectable in upper respiratoy specimens during the acute phase of infection. The lowest concentration of SARS-CoV-2 viral copies this assay can detect is 131 copies/mL. A negative result does not preclude SARS-Cov-2 infection and should not be used as the sole basis for treatment or other patient management decisions. A negative result may occur with  improper specimen collection/handling, submission of specimen other than nasopharyngeal swab, presence of viral mutation(s) within the areas targeted by this assay, and inadequate number of viral copies (<131 copies/mL). A negative result must be combined with clinical observations, patient history, and epidemiological information. The expected result is Negative.  Fact  Sheet for Patients:  PinkCheek.be  Fact Sheet for Healthcare Providers:  GravelBags.it  This test is no t yet approved or cleared by the [place] FDA and  has been authorized for detection and/or diagnosis of SARS-CoV-2 by FDA under an Emergency Use Authorization (EUA). This EUA will remain  in effect (meaning this test can be used) for the duration of the COVID-19 declaration under Section 564(b)(1) of the Act, 21 U.S.C. section 360bbb-3(b)(1), unless the authorization is terminated or revoked sooner.     Influenza A by PCR NEGATIVE NEGATIVE Final   Influenza B by PCR NEGATIVE NEGATIVE Final    Comment: (NOTE) The Xpert Xpress SARS-CoV-2/FLU/RSV assay is intended as an aid in  the diagnosis of influenza from Nasopharyngeal swab specimens and  should not be used as a sole basis for treatment. Nasal washings and  aspirates are unacceptable for Xpert Xpress  SARS-CoV-2/FLU/RSV  testing.  Fact Sheet for Patients: PinkCheek.be  Fact Sheet for Healthcare Providers: GravelBags.it  This test is not yet approved or cleared by the [place] FDA and  has been authorized for detection and/or diagnosis of SARS-CoV-2 by  FDA under an Emergency Use Authorization (EUA). This EUA will remain  in effect (meaning this test can be used) for the duration of the  Covid-19 declaration under Section 564(b)(1) of the Act, 21  U.S.C. section 360bbb-3(b)(1), unless the authorization is  terminated or revoked. Performed at [facility], [address] [place],  [address]      Time coordinating discharge: Over 30 minutes  SIGNED:   Ezekiel Slocumb, DO [facility] 02/10/2020, 2:21 PM   If 7PM-7AM, please contact night-coverage [email or web address]

## 2020-02-10 NOTE — Progress Notes (Signed)
Physical Therapy Treatment Patient Details Name: [name] MRN: [id number] DOB: [birth year] Today's Date: 02/10/2020    History of Present Illness [name] is a 41yoF who [facility]14 c Lt shoulder pain, bilat pedal pain, bladder spasm. Pt was recently at [place] at [facility], has been home for 9 days, initially getting around well, AMB in house with RW. Per RN, pt has not been taking lyrica, which pt says is for her neuropathy, formerly seeing pain management. PMH includes chronic left achilles wound followed by podiatry, chronic Left diabetic heel ulcer followed by podiatry, PVD, chronic left shoulder DJD/pain; Rt shoulder OA s/p surgery. Pt went to MRI late 11/15 found to have acute frontal lobe infarcts.    PT Comments    Pt pleasant & agreeable to tx, eager to get back in bed after sitting in recliner for prolonged period of time. Pt does not have wedge shoes here, as her husband took them home earlier, so non skid socks utilized for transfer. Pt requires 2 attempts for successful sit>stand from recliner with pt requiring mod assist 2nd time (unable to achieve buttocks clearance on 1st attempt) with PT educating pt on rocking motion to assist and pt requiring extra time to come to full upright standing. Once standing pt is able to complete stand pivot transfer recliner>bed with RW & min assist with slow movements. Pt requires min assist for sit>supine & cuing to scoot to center of bed. PT reviews stroke & reoccurance, as well as need to maintain healthy diet, f/u with MD appointments, and take medications as prescribed. Pt reports she has read the stroke handout & does not have any questions at this time.     Follow Up Recommendations  SNF     Equipment Recommendations  Rolling walker with 5" wheels;3in1 (PT);Wheelchair (measurements PT);Wheelchair cushion (measurements PT);Hospital bed;Other (comment)    Recommendations for Other Services       Precautions /  Restrictions Precautions Precautions: Fall Restrictions Weight Bearing Restrictions: Yes LLE Weight Bearing: Weight bearing as tolerated Other Position/Activity Restrictions: Per Podiatry note: "Patient may be weightbearing as tolerated to the left foot but needs to be wearing something to protect the posterior heel area from rubbing against shoe or boot and needs pressure relief on the plantar heel.  Recommended to physical therapy wearing a wedge shoe for forefoot ambulation."    Mobility  Bed Mobility Overal bed mobility: Needs Assistance Bed Mobility: Sit to Supine;Rolling Rolling: Supervision (bed rail)    Sit to supine: Min assist;HOB elevated   General bed mobility comments: to elevate RLE onto bed, cuing for pt to scoot to center of bed  Transfers Overall transfer level: Needs assistance Equipment used: Rolling walker (2 wheeled) Transfers: Sit to/from [facility] Sit to Stand: Mod assist;From elevated surface Stand pivot transfers: Min assist       General transfer comment: RW, rocking then standing on 3rd rock, 2 attempts to sit>stand from recliner  Ambulation/Gait                 Stairs             Wheelchair Mobility    Modified Rankin (Stroke Patients Only)       Balance Overall balance assessment: Needs assistance       Standing balance support: Bilateral upper extremity supported Standing balance-Leahy Scale: Fair Standing balance comment: pt requiring B UE support on RW for static standing balance  Cognition Arousal/Alertness: Awake/alert Behavior During Therapy: WFL for tasks assessed/performed Overall Cognitive Status: Within Functional Limits for tasks assessed                                        Exercises     General Comments        Pertinent Vitals/Pain Pain Assessment: Faces Faces Pain Scale: Hurts whole lot Pain Location: L upper  arm/shoulder Pain Descriptors / Indicators: Guarding;Grimacing (increased pain with touch) Pain Intervention(s): Limited activity within patient's tolerance;Monitored during session;Patient requesting pain meds-RN notified    Home Living                      Prior Function            PT Goals (current goals can now be found in the care plan section) Acute Rehab PT Goals Patient Stated Goal: improve pain in L UE to tolerate transfers PT Goal Formulation: With patient Time For Goal Achievement: 02/19/20 Potential to Achieve Goals: Fair Progress towards PT goals: Progressing toward goals    Frequency    7X/week      PT Plan Current plan remains appropriate    Co-evaluation              AM-PAC PT "6 Clicks" Mobility   Outcome Measure  Help needed turning from your back to your side while in a flat bed without using bedrails?: A Little Help needed moving from lying on your back to sitting on the side of a flat bed without using bedrails?: A Lot Help needed moving to and from a bed to a chair (including a wheelchair)?: A Little Help needed standing up from a chair using your arms (e.g., wheelchair or bedside chair)?: A Lot Help needed to walk in hospital room?: A Lot Help needed climbing 3-5 steps with a railing? : Total 6 Click Score: 13    End of Session Equipment Utilized During Treatment: Gait belt Activity Tolerance: Patient limited by pain;Patient tolerated treatment well Patient left: in bed;with call bell/phone within reach;with nursing/sitter in room Nurse Communication: Mobility status;Patient requests pain meds PT Visit Diagnosis: Difficulty in walking, not elsewhere classified (R26.2);Muscle weakness (generalized) (M62.81)     Time: 8295-6213 PT Time Calculation (min) (ACUTE ONLY): 23 min  Charges:  $Therapeutic Activity: 23-37 mins                     Lavone Nian, PT, DPT 02/10/20, 4:08 PM    Waunita Schooner 02/10/2020, 4:06  PM

## 2020-02-10 NOTE — Progress Notes (Signed)
Patient is being discharged home.  Discharge papers given and explained to patient.  Patient verbalized understanding.  Meds and f/u appointments reviewed. Rx sent electronically to pharmacy.  Patient made aware. Awaiting EMS to transport .

## 2020-02-10 NOTE — Consult Note (Signed)
ORTHOPAEDIC CONSULTATION  REQUESTING PHYSICIAN: Ezekiel Slocumb, DO  Chief Complaint:   Left shoulder pain.  History of Present Illness: [name] is a 69 y.o. adult with multiple medical problems including obesity, gastroesophageal reflux disease, diabetes, anxiety/depression, COPD, asthma, sleep apnea, paroxysmal atrial fibrillation, hypertension, hyperlipidemia, hypothyroidism, and status post a recent stroke affecting her left side who was admitted last week for this stroke.  The patient is well-known to me as I have performed a reverse right total shoulder arthroplasty on her about 4 years ago.  The patient has been complaining of worsening left shoulder pain over the past month or so, making it difficult for her to use a walker, prompting this consultation.   The patient denies any specific injury to the shoulder, but does note that she has fallen on numerous occasions.  The patient also has a known history of degenerative joint disease affecting the left shoulder.  She notes that the pain is primarily in the anterior and lateral aspects of the shoulder with radiation down into the anterior aspect of the upper arm.  She denies any neck pain, nor does she note any numbness or paresthesias down her arm to her hand.  Past Medical History:  Diagnosis Date  . Abnormal antibody titer   . Anxiety and depression   . Arthritis   . Asthma   . Cystocele   . Depression   . Diabetes ([place])   . DVT (deep venous thrombosis) (HCC)    Righ calf  . Dyspnea    11/08/2018 - "more now due to lack of exercise"  . Dysrhythmia    afib  . Edema   . GERD (gastroesophageal reflux disease)   . Heart murmur    Echocardiogram June 2015: Mild MR (possible vegetation seen on TTE, not seen on TEE), normal LV size with moderate concentric LVH. Normal function EF 60-65%. Normal diastolic function. Mild LA dilation.  Marland Kitchen History of IBS   . History  of kidney stones   . History of methicillin resistant staphylococcus aureus (MRSA) 2008  . Hyperlipidemia   . Hypertension   . Hypothyroidism   . Insomnia   . Kidney stone    kidney stones with lithotripsy .  [facility]- "adrenal glands"  . Neuropathy involving both lower extremities   . Obesity, Class II, BMI 35-39.9, with comorbidity   . Paroxysmal atrial fibrillation ([place]) 2007   In June 2015, Cardiac Event Monitor: Mostly SR/sinus arrhythmia with PVCs that are frequent. Short bursts of A. fib lasting several minutes;; CHA2DS2-VASc Score = 5 (age, Female, PVD, DM, HTN)  . Peripheral vascular disease ([place])   . Sleep apnea    use C-PAP  . Urinary incontinence   . Venous stasis dermatitis of both lower extremities    Past Surgical History:  Procedure Laterality Date  . ANKLE SURGERY Right   . APPLICATION OF WOUND VAC Left 06/27/2015   Procedure: APPLICATION OF WOUND VAC ( POSSIBLE ) ;  Surgeon: [name], MD;  Location: [facility];  Service: Vascular;  Laterality: Left;  . APPLICATION OF WOUND VAC Left 09/25/2016   Procedure: APPLICATION OF WOUND VAC;  Surgeon: [name], DPM;  Location: [facility];  Service: Podiatry;  Laterality: Left;  . arm surgery     right    . CHOLECYSTECTOMY    . COLONOSCOPY    . COLONOSCOPY WITH PROPOFOL N/A 01/11/2017   Procedure: COLONOSCOPY WITH PROPOFOL;  Surgeon: [name], MD;  Location: [facility]COPY;  Service: Endoscopy;  Laterality: N/A;  . COLONOSCOPY WITH PROPOFOL N/A 02/22/2017   Procedure: COLONOSCOPY WITH PROPOFOL;  Surgeon: [name], MD;  Location: [facility] ENDOSCOPY;  Service: Endoscopy;  Laterality: N/A;  . COLONOSCOPY WITH PROPOFOL N/A 05/31/2017   Procedure: COLONOSCOPY WITH PROPOFOL;  Surgeon: [name], MD;  Location: [facility]COPY;  Service: Endoscopy;  Laterality: N/A;  . ESOPHAGOGASTRODUODENOSCOPY (EGD) WITH PROPOFOL N/A 01/11/2017   Procedure: ESOPHAGOGASTRODUODENOSCOPY (EGD) WITH PROPOFOL;  Surgeon:  [name], MD;  Location: [facility]COPY;  Service: Endoscopy;  Laterality: N/A;  . ESOPHAGOGASTRODUODENOSCOPY (EGD) WITH PROPOFOL N/A 10/25/2018   Procedure: ESOPHAGOGASTRODUODENOSCOPY (EGD) WITH PROPOFOL;  Surgeon: [name], MD;  Location[facility] ENDOSCOPY;  Service: Endoscopy;  Laterality: N/A;  . ESOPHAGOGASTRODUODENOSCOPY (EGD) WITH PROPOFOL N/A 11/29/2019   Procedure: ESOPHAGOGASTRODUODENOSCOPY (EGD) WITH PROPOFOL;  Surgeon: Toledo, Benay Pike, MD;  Location: [facility] ENDOSCOPY;  Service: Gastroenterology;  Laterality: N/A;  . HERNIA REPAIR     umbilical  . HIATAL HERNIA REPAIR    . I & D EXTREMITY Left 06/27/2015   Procedure: IRRIGATION AND DEBRIDEMENT EXTREMITY            ( CALF HEMATOMA ) POSSIBLE WOUND VAC;  Surgeon: [name], MD;  Location: [facility];  Service: Vascular;  Laterality: Left;  . INCISION AND DRAINAGE OF WOUND Left 09/25/2016   Procedure: IRRIGATION AND DEBRIDEMENT - PARTIAL RESECTION OF ACHILLES TENDON WITH WOUND VAC APPLICATION;  Surgeon: [name], DPM;  Location: [facility];  Service: Podiatry;  Laterality: Left;  . INCISION AND DRAINAGE OF WOUND Left 11/09/2018   Procedure: Excision of left foot wound with ACell placement;  Surgeon: Wallace Going, DO;  Location: [place];  Service: Plastics;  Laterality: Left;  . IRRIGATION AND DEBRIDEMENT ABSCESS Left 09/07/2016   Procedure: IRRIGATION AND DEBRIDEMENT ABSCESS LEFT HEEL;  Surgeon: [name], DPM;  Location: [facility];  Service: Podiatry;  Laterality: Left;  . JOINT REPLACEMENT  2013   left knee replacement  . KIDNEY SURGERY Right    kidney stones  . LITHOTRIPSY    . LOWER EXTREMITY ANGIOGRAPHY Left 04/04/2018   Procedure: LOWER EXTREMITY ANGIOGRAPHY;  Surgeon: [name], MD;  Location: [place] CV LAB;  Service: Cardiovascular;  Laterality: Left;  . NM GATED MYOVIEW ([place] HX)  February 2017   Likely breast attenuation. LOW RISK study. Normal EF 55-60%.  . OTHER SURGICAL HISTORY      08/2016 or 09/2016 surgery on achilles tenso h/o staph infection heal. Dr. [name]  . removal of left hematoma Left    leg  . REVERSE SHOULDER ARTHROPLASTY Right 10/08/2015   Procedure: REVERSE SHOULDER ARTHROPLASTY;  Surgeon: [name], MD;  Location: [facility];  Service: Orthopedics;  Laterality: Right;  . SLEEVE GASTROPLASTY    . TONSILLECTOMY    . TOTAL KNEE ARTHROPLASTY Left   . TOTAL SHOULDER REPLACEMENT Right   . TRANSESOPHAGEAL ECHOCARDIOGRAM  08/08/2013   Mild LVH, EF 60-65%. Moderate LA dilation and mild RA dilation. Mild MR with no evidence of stenosis and no evidence of endocarditis. A false chordae is noted.  . TRANSTHORACIC ECHOCARDIOGRAM  08/03/2013   Mild-Moderate concentric LVH, EF 60-65%. Normal diastolic function. Mild LA dilation. Mild MR with possible vegetation  - not confirmed on TEE   . ULNAR NERVE TRANSPOSITION Right 08/06/2016   Procedure: ULNAR NERVE DECOMPRESSION/TRANSPOSITION;  Surgeon: [name], MD;  Location: [facility];  Service: Orthopedics;  Laterality: Right;  . UPPER GI ENDOSCOPY    .  VAGINAL HYSTERECTOMY     Social History   Socioeconomic History  . Marital status: Married    Spouse name: Not on file  . Number of children: Not on file  . Years of education: Not on file  . Highest education level: Not on file  Occupational History  . Not on file  Tobacco Use  . Smoking status: Never Smoker  . Smokeless tobacco: Never Used  Vaping Use  . Vaping Use: Never used  Substance and Sexual Activity  . Alcohol use: No    Alcohol/week: 0.0 standard drinks  . Drug use: Never  . Sexual activity: Yes  Other Topics Concern  . Not on file  Social History Narrative   She is currently married -- for 30+ years. Does not work. Does not smoke or take alcohol. She never smoked. She exercises at least 3 days a week since before her gastric surgery.   Marital status reviewed in history of present illness.   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 Difficulty of Paying Living Expenses: Not on file  Food Insecurity:   . Worried About Charity fundraiser in the Last Year: Not on file  . Ran Out of Food in the Last Year: Not on file  Transportation Needs:   . Lack of Transportation (Medical): Not on file  . Lack of Transportation (Non-Medical): Not on file  Physical Activity:   . Days of Exercise per Week: Not on file  . Minutes of Exercise per Session: Not on file  Stress:   . Feeling of Stress : Not on file  Social Connections:   . Frequency of Communication with Friends and Family: Not on file  . Frequency of Social Gatherings with Friends and Family: Not on file  . Attends Religious Services: Not on file  . Active Member of Clubs or Organizations: Not on file  . Attends Archivist Meetings: Not on file  . Marital Status: Not on file   Family History  Problem Relation Age of Onset  . Skin cancer Father   . Diabetes Father   . Hypertension Father   . Peripheral vascular disease Father   . Cancer Father   . Cerebral aneurysm Father   . Alcohol abuse Father   . Varicose Veins Mother   . Kidney disease Mother   . Arthritis Mother   . Mental illness Sister   . Cancer Maternal Aunt        breast  . Breast cancer Maternal Aunt   . Arthritis Maternal Grandmother   . Hypertension Maternal Grandmother   . Diabetes Maternal Grandmother   . Arthritis Maternal Grandfather   . Heart disease Maternal Grandfather   . Hypertension Maternal Grandfather   . Arthritis Paternal Grandmother   . Hypertension Paternal Grandmother   . Diabetes Paternal Grandmother   . Arthritis Paternal Grandfather   . Hypertension Paternal Grandfather   . Bladder Cancer Neg Hx   . Kidney cancer Neg Hx    Allergies  Allergen Reactions  . Morphine And Related Other (See Comments) and Nausea Only    Patient becomes very  confused Other reaction(s): Other (See Comments), Other (See Comments) Patient becomes very confused Patient becomes very confused   . Penicillins Hives, Shortness Of Breath, Swelling and Other (See Comments)    Facial swelling  Has patient had a PCN reaction causing immediate rash, facial/tongue/throat swelling, SOB or lightheadedness with hypotension: Yes Has patient had a PCN reaction causing severe rash involving mucus membranes or skin necrosis: Yes Has patient had a PCN reaction that required hospitalization No Has patient had a PCN reaction occurring within the last 10 years: No If all of the above answers are "NO", then may proceed with Cephalosporin use.  Other reaction(s): Other (See Comments), Other (See Comments) Facial swelling Has patient had a PCN reaction causing immediate rash, facial/tongue/throat swelling, SOB or lightheadedness with hypotension: Yes Has patient had a PCN reaction causing severe rash involving mucus membranes or skin necrosis: Yes Has patient had a PCN reaction that required hospitalization No Has patient had a PCN reaction occurring within the last 10 years: No If all of the above answers are "NO", then may proceed with Cephalosporin use. Facial swelling Has patient had a PCN reaction causing immediate rash, facial/tongue/throat swelling, SOB or lightheadedness with hypotension: Yes Has patient had a PCN reaction causing severe rash involving mucus membranes or skin necrosis: Yes Has patient had a PCN reaction that required hospitalization No Has patient had a PCN reaction occurring within the last 10 years: No If all of the above answers are "NO", then may proceed with Cephalosporin use. Facial swelling Has patient had a PCN reaction causing immediate rash, facial/tongue/throat swelling, SOB or lightheadedness with hypotension: Yes Has patient had a PCN reaction causing severe rash involving mucus membranes or skin necrosis: Yes Has patient had a PCN  reaction that required hospitalization No Has patient had a PCN reaction occurring within the last 10 years: No If all of the above answers are "NO", then may proceed with Cephalosporin use.   . Ambien [Zolpidem]     Hallucination   . Aspirin Hives  . Oxytetracycline Hives    Other reaction(s): Unknown    Prior to Admission medications   Medication Sig Start Date End Date Taking? Authorizing Provider  acetaminophen (TYLENOL) 325 MG tablet Take 2 tablets (650 mg total) by mouth every 6 (six) hours as needed for mild pain or moderate pain (or Fever >/= 101). 09/08/19  Yes Guilford Shi, MD  albuterol (PROVENTIL) (2.5 MG/3ML) 0.083% nebulizer solution Take 2.5 mg by nebulization every 4 (four) hours as needed for wheezing or shortness of breath.   Yes [provider]  albuterol (VENTOLIN HFA) 108 (90 Base) MCG/ACT inhaler Inhale 2 puffs into the lungs every 6 (six) hours as needed for wheezing or shortness of breath.   Yes [provider]  ALPRAZolam Duanne Moron) 0.5 MG tablet Take 0.5 mg by mouth at bedtime as needed for anxiety or sleep.   Yes [provider]  amLODipine (NORVASC) 5 MG tablet Take 1 tablet (5 mg total) by mouth daily. 11/28/19 02/26/20 Yes Loel Dubonnet, NP  apixaban (ELIQUIS) 5 MG TABS tablet Take 1 tablet (5 mg total) by mouth 2 (two) times daily. 01/30/20  Yes Loel Dubonnet, NP  buPROPion (WELLBUTRIN XL) 150 MG 24 hr tablet Take 1 tablet (150 mg total) by mouth daily. 05/02/19  Yes [name], [name], MD  clobetasol cream (TEMOVATE) 0.53 % Apply 1 application topically 2 (two) times daily as needed (skin irritation).    Yes [provider]  colchicine 0.6 MG tablet Take 0.6 mg by mouth 2 (two) times daily as needed (acute gout flare).    Yes [provider]  DULoxetine (CYMBALTA) 60 MG capsule Take 60 mg by mouth daily.  Yes [provider]  ergocalciferol (VITAMIN D2) 1.25 MG (50000 UT) capsule Take 50,000 Units  by mouth once a week.   Yes [provider]  fluticasone-salmeterol (ADVAIR HFA) 115-21 MCG/ACT inhaler Inhale 2 puffs into the lungs daily. Rinse mouth thoroughly after use 08/16/19  Yes Tyler Pita, MD  levocetirizine (XYZAL) 5 MG tablet Take 0.5 tablets (2.5 mg total) by mouth every evening. Patient taking differently: Take 5 mg by mouth at bedtime as needed for allergies.  09/07/19  Yes [name], [name], MD  levothyroxine (SYNTHROID) 175 MCG tablet Take 1 tablet (175 mcg total) by mouth daily before breakfast. 03/10/19  Yes [name], [name], MD  losartan-hydrochlorothiazide (HYZAAR) 50-12.5 MG tablet Take 1 tablet by mouth daily.   Yes [provider]  metoprolol succinate (TOPROL-XL) 50 MG 24 hr tablet TAKE 1 TABLET BY MOUTH DAILY Patient taking differently: Take 50 mg by mouth daily.  11/09/19  Yes Loel Dubonnet, NP  montelukast (SINGULAIR) 10 MG tablet Take 1 tablet (10 mg total) by mouth at bedtime. 02/03/19  Yes [name], [name], MD  ondansetron (ZOFRAN) 4 MG tablet Take 1 tablet (4 mg total) by mouth every 8 (eight) hours as needed for nausea or vomiting. 02/14/19  Yes [name], [name], MD  oxybutynin (DITROPAN XL) 10 MG 24 hr tablet Take 10 mg by mouth at bedtime.   Yes [provider]  pantoprazole (PROTONIX) 40 MG tablet Take 40 mg by mouth daily.  04/07/19 04/06/20 Yes [provider]  pregabalin (LYRICA) 50 MG capsule Take 1 capsule (50 mg total) by mouth 3 (three) times daily. 12/27/19 06/24/20 Yes Sharen Hones, MD  rosuvastatin (CRESTOR) 20 MG tablet Take 1 tablet (20 mg total) by mouth daily. 01/30/20 07/28/20 Yes Loel Dubonnet, NP  sitaGLIPtin (JANUVIA) 25 MG tablet Take 1 tablet (25 mg total) by mouth daily. 12/26/19  Yes [name], [name], MD  sucralfate (CARAFATE) 1 GM/10ML suspension Take 1 g by mouth 3 (three) times daily.  12/06/19  Yes [provider]  TOVIAZ 8 MG TB24 tablet TAKE ONE  TABLET BY MOUTH EVERY DAY Patient taking differently: Take 8 mg by mouth daily.  08/07/19  Yes McGowan, [name] A, PA-C  trospium (SANCTURA) 20 MG tablet Take 20 mg by mouth 2 (two) times daily.   Yes [provider]  estradiol (ESTRACE) 0.1 MG/GM vaginal cream Place 1 Applicatorful vaginally every Monday, Wednesday, and Friday. Apply 0.5mg  (pea-sized amount)  just inside the vaginal introitus with a finger-tip on Monday, Wednesday and Friday nights, 02/01/19   McGowan, [name] A, PA-C  HYDROcodone-acetaminophen (NORCO/VICODIN) 5-325 MG tablet Take 1 tablet by mouth every 8 (eight) hours as needed for severe pain. Must last 30 days 12/27/19 01/26/20  Sharen Hones, MD  metFORMIN (GLUCOPHAGE) 500 MG tablet Take 1 tablet (500 mg total) by mouth 2 (two) times daily with a meal. With food Patient not taking: Reported on 02/04/2020 12/26/19   [name], [name], MD   No results found.  Positive ROS: All other systems have been reviewed and were otherwise negative with the exception of those mentioned in the HPI and as above.  Physical Exam: General:  Alert, no acute distress Psychiatric:  Patient is competent for consent with normal mood and affect   Cardiovascular:  No pedal edema Respiratory:  No wheezing, non-labored breathing GI:  Abdomen is soft and non-tender Skin:  No lesions in the area of chief complaint Neurologic:  Sensation intact distally Lymphatic:  No  axillary or cervical lymphadenopathy  Orthopedic Exam:  Orthopedic examination is limited to the left shoulder and upper extremity.  Skin inspection around the left shoulder is unremarkable.  No swelling, erythema, ecchymosis, abrasions, or other skin abnormalities are identified.  She has moderate tenderness to palpation over the anterior more so than anterolateral aspects of the shoulder.  Actively, she is able to forward flex to 95 degrees and abduct to 80 degrees.  Passively, she can tolerate forward flexion to 110  degrees.  She has moderate-severe pain with range of motion.  No obvious crepitance is noted.  Strength is decreased as she exhibits 3/5 strength with resisted forward flexion and abduction, and 4/5 strength with resisted internal and external rotation.  She is grossly neurovascularly intact to the left upper extremity and hand.  X-rays:  A recent chest x-ray is available for review which includes the left shoulder and has been reviewed by myself.  This demonstrates moderate degenerative changes of the glenohumeral joint as manifest by joint space narrowing and inferior humeral osteophytes.  Assessment: Impingement/tendinopathy with underlying degenerative joint disease, left shoulder.  Plan: The treatment options have been discussed with the patient and her husband, who is at the bedside.  At this point, the patient certainly is not a surgical candidate at this time, given her recent stroke and subsequent anticoagulation, nor would I immediately jump to a surgical option when numerous nonsurgical options are available.  The patient is offered and accepts a steroid injection into the left subacromial space.  This injection is performed sterilely using 1 cc of Kenalog-40 (40 mg) and 5 cc of 0.5% Sensorcaine.  The patient tolerated the procedure well.  Thank you for asking me to participate in the care of this pleasant yet unfortunate woman.  I will be happy to follow her up in my office on an as necessary basis.   Pascal Lux, MD  Beeper #:  [phone]  02/10/2020 12:53 PM

## 2020-02-12 ENCOUNTER — Other Ambulatory Visit: Payer: Self-pay | Admitting: Internal Medicine

## 2020-02-12 ENCOUNTER — Telehealth: Payer: Self-pay | Admitting: Internal Medicine

## 2020-02-12 DIAGNOSIS — B373 Candidiasis of vulva and vagina: Secondary | ICD-10-CM

## 2020-02-12 DIAGNOSIS — E119 Type 2 diabetes mellitus without complications: Secondary | ICD-10-CM

## 2020-02-12 DIAGNOSIS — I152 Hypertension secondary to endocrine disorders: Secondary | ICD-10-CM

## 2020-02-12 DIAGNOSIS — M19012 Primary osteoarthritis, left shoulder: Secondary | ICD-10-CM | POA: Insufficient documentation

## 2020-02-12 DIAGNOSIS — E1159 Type 2 diabetes mellitus with other circulatory complications: Secondary | ICD-10-CM

## 2020-02-12 DIAGNOSIS — B3731 Acute candidiasis of vulva and vagina: Secondary | ICD-10-CM

## 2020-02-12 LAB — VITAMIN B1: Vitamin B1 (Thiamine): 85.6 nmol/L (ref 66.5–200.0)

## 2020-02-12 MED ORDER — METFORMIN HCL 500 MG PO TABS: 500.0000 mg | ORAL_TABLET | Freq: Every day | ORAL | 3 refills | Status: DC

## 2020-02-12 MED ORDER — FLUCONAZOLE 150 MG PO TABS: 150.0000 mg | ORAL_TABLET | Freq: Once | ORAL | 0 refills | Status: AC

## 2020-02-12 NOTE — Telephone Encounter (Signed)
Left message that medication has been sent in

## 2020-02-12 NOTE — Telephone Encounter (Signed)
Sent diflucan  1 dose now and repeat dose in 3 days

## 2020-02-12 NOTE — Telephone Encounter (Signed)
Patient called in need a antibiotics yeast infection

## 2020-02-12 NOTE — Telephone Encounter (Signed)
Patient informed and verbalized understanding

## 2020-02-12 NOTE — Telephone Encounter (Signed)
Pt called and wanted to discuss the metFORMIN (GLUCOPHAGE) 500 MG tablet and the sitaGLIPtin (JANUVIA) 25 MG tablet she wanted to know the correct dosage

## 2020-02-12 NOTE — Telephone Encounter (Signed)
Per 12/26/19 telephone note:  "I want to change metformin to [place] for now given kidney function fluctuating though its improved can do 1x per day metformin with Celesta Gentile" [name]

## 2020-02-12 NOTE — Telephone Encounter (Signed)
Patient states she was on antibiotics while in the hospital. States she is very uncomfortable, tender, and itching.   Please advise

## 2020-02-13 ENCOUNTER — Telehealth: Payer: Self-pay

## 2020-02-13 DIAGNOSIS — I639 Cerebral infarction, unspecified: Secondary | ICD-10-CM

## 2020-02-13 NOTE — Telephone Encounter (Signed)
I have pended order for shower chair, is patient wheelchair bound and need of transfer board.

## 2020-02-13 NOTE — Telephone Encounter (Signed)
Transition Care Management Follow-up Telephone Call  Date of discharge and from where: 02/10/20 from [facility].  How have you been since you were released from the hospital? Information received from husband (HIPAA compliant). Notes patient strength level fluctuates. Short term memory loss, intermittent. Visual hallucinations x1. Patient mentioned today her head did not feel right. Some difficulty sleeping upon first returning home, now resting comfortably. Husband is concerned of another stroke. Denies all signs/symptoms of stroke. Nurse encourages to seek immediate attention if any S/S appear. Discussed S/S.  Monitoring patient closely, notes patient commented before and after leaving hospital she would kill herself if she had to go to rehab again. No suicidal signs of distress.   Any questions or concerns? Yes, concerns of dementia. Patient has fear of dying of stroke, fear of falling. Husband notes OK for PCP to call anytime as needed.   Husband has new request would like a rental transport bench used for showering from [place], please assist. Will reach out to Clinic RN for assist in getting this started.   Items Reviewed:  Did the pt receive and understand the discharge instructions provided? Yes   Medications obtained and verified? Yes  husband understands, verbalized medications and overseer.  Other? No   Any new allergies since your discharge? No   Dietary orders reviewed? Yes, heart health/carb modified.   Do you have support at home? Yes   Home Care and Equipment/Supplies: Were home health services ordered? Yes If so, what is the name of the agency? Yes, [facility].  Has the agency set up a time to come to the patient's home? Husband notes HH/OT to come 3 times weekly, awaiting start date and time. Husband spoke with hospitalist Gina yesterday who plans to follow up for patient.   Were any new equipment or medical supplies ordered? Yes, rental equipment. Hospital bed, hoyer  lift, wheelchair.  What is the name of the medical supply agency? [facility] ([facility]). New request   Were you able to get the supplies/equipment? Yes.   Do you have any questions related to the use of the equipment or supplies? No  Functional Questionnaire: (I = Independent and D = Dependent) ADLs: husband assist as needed  Bathing/Dressing- d  Meal Prep- husband assist  Eating- i  Maintaining continence- BSC in use. Husband reports baseline. Manageable.   Transferring/Ambulation- wheelchair, walker  Managing Meds- husband assist  Follow up appointments reviewed:   PCP Hospital f/u appt confirmed? Yes Scheduled to see Dr. Orland Mustard on 02/28/20 @ 3:00. Obtain BMP/CBC.  [facility] f/u appt confirmed? Yes  Scheduled to see Vascular Surgery 12/7 @ 11:00, Orthopedics (Left shoulder pain )as needed, Podiatry in 2 weeks. Husband plans to schedule Neurology; asked if assistance needed. Declined. Plans to self schedule Neurology. Nurse encourages to call this office back if any assistance needed.   Are transportation arrangements needed? No   If their condition worsens, is the pt aware to call PCP or go to the Emergency Dept.? Yes  Was the patient provided with contact information for the PCP's office or ED? Yes  Was to pt encouraged to call back with questions or concerns? Yes

## 2020-02-14 ENCOUNTER — Telehealth: Payer: Self-pay | Admitting: Internal Medicine

## 2020-02-14 LAB — MISC LABCORP TEST (SEND OUT): Labcorp test code: 70115

## 2020-02-14 LAB — VITAMIN B6: Vitamin B6: 2.6 ug/L (ref 2.0–32.8)

## 2020-02-14 MED ORDER — TUB TRANSFER BOARD MISC: 1.0000 | 0 refills | Status: DC | PRN

## 2020-02-14 NOTE — Telephone Encounter (Signed)
Please fax orders to [facility] for transfer board and shower chair.

## 2020-02-14 NOTE — Telephone Encounter (Signed)
[facility] called to report a delay of PT because patient wanted a female to come out  Start of care 02/17/20

## 2020-02-14 NOTE — Telephone Encounter (Signed)
Orders have been faxed

## 2020-02-14 NOTE — Telephone Encounter (Signed)
Faxed to  [facility] for  Client coordination note report for order details: need shower bench due to falls faxed on 02-13-2020

## 2020-02-14 NOTE — Telephone Encounter (Signed)
Can you sign order for shower chair and transfer board please?

## 2020-02-14 NOTE — Telephone Encounter (Signed)
I will Have Doc of the day sign

## 2020-02-14 NOTE — Telephone Encounter (Signed)
Randall Hiss offered to cover  today so this is why I asked him  Happy thanksgiving

## 2020-02-14 NOTE — Telephone Encounter (Signed)
Noted  

## 2020-02-14 NOTE — Telephone Encounter (Signed)
Do I need to sign anything for this?

## 2020-02-14 NOTE — Telephone Encounter (Signed)
No taken care of and faxed. Thanks!!

## 2020-02-17 DIAGNOSIS — G4733 Obstructive sleep apnea (adult) (pediatric): Secondary | ICD-10-CM | POA: Diagnosis not present

## 2020-02-17 DIAGNOSIS — E1140 Type 2 diabetes mellitus with diabetic neuropathy, unspecified: Secondary | ICD-10-CM | POA: Diagnosis not present

## 2020-02-17 DIAGNOSIS — Z7901 Long term (current) use of anticoagulants: Secondary | ICD-10-CM | POA: Diagnosis not present

## 2020-02-17 DIAGNOSIS — I69320 Aphasia following cerebral infarction: Secondary | ICD-10-CM | POA: Diagnosis not present

## 2020-02-17 DIAGNOSIS — E785 Hyperlipidemia, unspecified: Secondary | ICD-10-CM | POA: Diagnosis not present

## 2020-02-17 DIAGNOSIS — Z7984 Long term (current) use of oral hypoglycemic drugs: Secondary | ICD-10-CM | POA: Diagnosis not present

## 2020-02-17 DIAGNOSIS — F419 Anxiety disorder, unspecified: Secondary | ICD-10-CM | POA: Diagnosis not present

## 2020-02-17 DIAGNOSIS — F329 Major depressive disorder, single episode, unspecified: Secondary | ICD-10-CM | POA: Diagnosis not present

## 2020-02-17 DIAGNOSIS — J45909 Unspecified asthma, uncomplicated: Secondary | ICD-10-CM | POA: Diagnosis not present

## 2020-02-17 DIAGNOSIS — E1151 Type 2 diabetes mellitus with diabetic peripheral angiopathy without gangrene: Secondary | ICD-10-CM | POA: Diagnosis not present

## 2020-02-17 DIAGNOSIS — M109 Gout, unspecified: Secondary | ICD-10-CM | POA: Diagnosis not present

## 2020-02-17 DIAGNOSIS — G4700 Insomnia, unspecified: Secondary | ICD-10-CM | POA: Diagnosis not present

## 2020-02-17 DIAGNOSIS — R32 Unspecified urinary incontinence: Secondary | ICD-10-CM | POA: Diagnosis not present

## 2020-02-17 DIAGNOSIS — J309 Allergic rhinitis, unspecified: Secondary | ICD-10-CM | POA: Diagnosis not present

## 2020-02-17 DIAGNOSIS — E559 Vitamin D deficiency, unspecified: Secondary | ICD-10-CM | POA: Diagnosis not present

## 2020-02-17 DIAGNOSIS — I69354 Hemiplegia and hemiparesis following cerebral infarction affecting left non-dominant side: Secondary | ICD-10-CM | POA: Diagnosis not present

## 2020-02-17 DIAGNOSIS — G894 Chronic pain syndrome: Secondary | ICD-10-CM | POA: Diagnosis not present

## 2020-02-17 DIAGNOSIS — Z79899 Other long term (current) drug therapy: Secondary | ICD-10-CM | POA: Diagnosis not present

## 2020-02-17 DIAGNOSIS — I10 Essential (primary) hypertension: Secondary | ICD-10-CM | POA: Diagnosis not present

## 2020-02-17 DIAGNOSIS — E039 Hypothyroidism, unspecified: Secondary | ICD-10-CM | POA: Diagnosis not present

## 2020-02-17 DIAGNOSIS — K219 Gastro-esophageal reflux disease without esophagitis: Secondary | ICD-10-CM | POA: Diagnosis not present

## 2020-02-17 DIAGNOSIS — I480 Paroxysmal atrial fibrillation: Secondary | ICD-10-CM | POA: Diagnosis not present

## 2020-02-17 DIAGNOSIS — N390 Urinary tract infection, site not specified: Secondary | ICD-10-CM | POA: Diagnosis not present

## 2020-02-17 DIAGNOSIS — N3281 Overactive bladder: Secondary | ICD-10-CM | POA: Diagnosis not present

## 2020-02-18 NOTE — Progress Notes (Signed)
Patient: [name]  Service Category: E/M  Provider: Gaspar Cola, MD  DOB: Nov 14, 1950  DOS: 02/19/2020  Location: Office  MRN: [id number]  Setting: Ambulatory outpatient  Referring Provider: [name], Olivia Mackie *  Type: Established Patient  Specialty: Interventional Pain Management  PCP: [name], [name], MD  Location: Remote location  Delivery: TeleHealth     Virtual Encounter - Pain Management PROVIDER NOTE: Information contained herein reflects review and annotations entered in association with encounter. Interpretation of such information and data should be left to medically-trained personnel. Information provided to patient can be located elsewhere in the medical record under "Patient Instructions". Document created using STT-dictation technology, any transcriptional errors that may result from process are unintentional.    Contact & Pharmacy Preferred: [phone] Home: [phone] (home) Mobile: [phone] (mobile) E-mail: [email or web address] .[place], [place] [address] [place] [address] Phone: [phone] Fax: [phone]   Pre-screening  [name] offered "in-person" vs "virtual" encounter. She indicated preferring virtual for this encounter.   Reason COVID-19*  Social distancing based on CDC and AMA recommendations.   I contacted [name] on 02/19/2020 via telephone.      I clearly identified myself as Gaspar Cola, MD. I verified that I was speaking with the correct person using two identifiers (Name: [name], and date of birth: 1950/10/23).  Consent I sought verbal advanced consent from [name] for virtual visit interactions. I informed [name] of possible security and privacy concerns, risks, and limitations associated with providing "not-in-person" medical evaluation and management services. I also informed [name] of the availability of "in-person"  appointments. Finally, I informed her that there would be a charge for the virtual visit and that she could be  personally, fully or partially, financially responsible for it. [name] expressed understanding and agreed to proceed.   Historic Elements   [name] is a 69 y.o. year old, adult patient evaluated today after our last contact on Visit date not found. [name]  has a past medical history of Abnormal antibody titer, Anxiety and depression, Arthritis, Asthma, Cystocele, Depression, Diabetes ([place]), DVT (deep venous thrombosis) ([place]), Dyspnea, Dysrhythmia, Edema, GERD (gastroesophageal reflux disease), Heart murmur, History of IBS, History of kidney stones, History of methicillin resistant staphylococcus aureus (MRSA) (2008), Hyperlipidemia, Hypertension, Hypothyroidism, Insomnia, Kidney stone, Neuropathy involving both lower extremities, Obesity, Class II, BMI 35-39.9, with comorbidity, Paroxysmal atrial fibrillation ([place]) (2007), Peripheral vascular disease ([place]), Sleep apnea, Urinary incontinence, and Venous stasis dermatitis of both lower extremities. She also  has a past surgical history that includes Ankle surgery (Right); Vaginal hysterectomy; Sleeve Gastroplasty; Lithotripsy; Kidney surgery (Right); transthoracic echocardiogram (08/03/2013); transesophageal echocardiogram (08/08/2013); Tonsillectomy; Total knee arthroplasty (Left); Hiatal hernia repair; Cholecystectomy; I & D extremity (Left, 06/27/2015); Application if wound vac (Left, 06/27/2015); removal of left hematoma (Left); NM GATED MYOVIEW [facility] HX) (February 2017); Reverse shoulder arthroplasty (Right, 10/08/2015); Hernia repair; Ulnar nerve transposition (Right, 08/06/2016); arm surgery; Irrigation and debridement abscess (Left, 09/07/2016); Incision and drainage of wound (Left, 09/25/2016); Application if wound vac (Left, 09/25/2016); Colonoscopy; Upper gi endoscopy; Esophagogastroduodenoscopy (egd) with propofol (N/A, 01/11/2017);  Colonoscopy with propofol (N/A, 01/11/2017); Colonoscopy with propofol (N/A, 02/22/2017); OTHER SURGICAL HISTORY; Total shoulder replacement (Right); Joint replacement (2013); Colonoscopy with propofol (N/A, 05/31/2017); Lower Extremity Angiography (Left, 04/04/2018); Esophagogastroduodenoscopy (egd) with propofol (N/A, 10/25/2018); Incision and drainage of wound (Left, 11/09/2018); and Esophagogastroduodenoscopy (egd) with propofol (N/A, 11/29/2019). [name] has  a current medication list which includes the following prescription(s): acetaminophen, albuterol, alprazolam, amlodipine, apixaban, bupropion, calcium-vitamin d, vitamin d3, clobetasol cream, colchicine, diclofenac sodium, duloxetine, ergocalciferol, estradiol, advair hfa, folic acid, hydrocodone-acetaminophen, [START ON 03/20/2020] hydrocodone-acetaminophen, [START ON 04/19/2020] hydrocodone-acetaminophen, levocetirizine, levothyroxine, losartan-hydrochlorothiazide, metformin, methenamine, metoprolol succinate, tub transfer board, montelukast, multivitamin with minerals, ondansetron, oxybutynin, pantoprazole, pregabalin, rosuvastatin, sitagliptin, sucralfate, thiamine, toviaz, trospium, and cyanocobalamin, and the following Facility-Administered Medications: ipratropium-albuterol. She  reports that she has never smoked. She has never used smokeless tobacco. She reports that she does not drink alcohol and does not use drugs. [name] is allergic to morphine and related, penicillins, ambien [zolpidem], aspirin, and oxytetracycline.   HPI  Today, she is being contacted for medication management.  The patient was scheduled today for a face-to-face visit however, her husband called and indicated that she had a stroke and was wondering if we could do a virtual visit.  Today I contacted the patient and clearly she had a stroke.  She indicates having left-sided body weakness and some difficulty with speech, which was evident.  Today I informed the patient that we  will be refilling her hydrocodone, Lyrica, and Cymbalta, but the last 2 will be transferred to her PCP for them to refill after 05/19/2020.  The patient indicates having had several strokes around September 2021 followed by some additional strokes around October 2021.  She also indicates having spent last week in the hospital because of those strokes.  She is currently at home receiving home care by a nurse and having physical therapy at home to recover from the strokes.  Still has difficulty standing up secondary to this left body weakness.  RTCB: 05/19/2020 Nonopioids transferred 02/19/2020: Lyrica, Cymbalta  Pharmacotherapy Assessment  Analgesic: Hydrocodone/APAP 5/325 mg, 1 tab PO q 8 hours (15 mg/day of hydrocodone) MME/day:15 mg/day.   Monitoring: [place] PMP: PDMP reviewed during this encounter.       Pharmacotherapy: No side-effects or adverse reactions reported. Compliance: No problems identified. Effectiveness: Clinically acceptable. Plan: Refer to "POC".  UDS:  Summary  Date Value Ref Range Status  11/28/2019 Note  Final    Comment:    ==================================================================== [employer] Select 13 (MW) ==================================================================== Test                             Result       Flag       Units  Drug Present and Declared for Prescription Verification   Alprazolam                     79           EXPECTED   ng/mg creat   Alpha-hydroxyalprazolam        80           EXPECTED   ng/mg creat    Source of alprazolam is a scheduled prescription medication. Alpha-    hydroxyalprazolam is an expected metabolite of alprazolam.  Drug Absent but Declared for Prescription Verification   Hydrocodone                    Not Detected UNEXPECTED ng/mg creat ==================================================================== Test                      Result    Flag   Units      Ref Range   Creatinine  70                mg/dL      >=20 ==================================================================== Declared Medications:  The flagging and interpretation on this report are based on the  following declared medications.  Unexpected results may arise from  inaccuracies in the declared medications.   **Note: The testing scope of this panel includes these medications:   Alprazolam (Xanax)  Hydrocodone   **Note: The testing scope of this panel does not include the  following reported medications:   Acetaminophen (Tylenol)  Acetaminophen  Albuterol  Amlodipine (Norvasc)  Bupropion (Wellbutrin XL)  Dabigatran (Pradaxa)  Duloxetine  Estradiol (Estrace)  Fesoterodine (Toviaz)  Fluticasone (Advair)  Hydrochlorothiazide (Hyzaar)  Levocetirizine (Xyzal)  Levofloxacin  Levothyroxine  Losartan (Hyzaar)  Metformin  Metoprolol  Montelukast  Nystatin  Ondansetron  Pantoprazole  Pregabalin (Lyrica)  Salmeterol (Advair)  Topical  Vitamin D2 (Ergocalciferol)  Zinc Oxide ==================================================================== For clinical consultation, please call [phone]. ====================================================================     Laboratory Chemistry Profile   Renal Lab Results  Component Value Date   BUN 21 02/09/2020   CREATININE 0.99 02/09/2020   BCR 16 11/09/2019   GFR 41.60 (L) 09/14/2019   GFRAA 58 (L) 12/25/2019   GFRNONAA >60 02/09/2020     Hepatic Lab Results  Component Value Date   AST 14 (L) 12/19/2019   ALT 10 12/19/2019   ALBUMIN 3.5 12/19/2019   ALKPHOS 84 12/19/2019   LIPASE 27 07/28/2018   AMMONIA <9 (L) 02/07/2020     Electrolytes Lab Results  Component Value Date   NA 140 02/09/2020   K 4.5 02/09/2020   CL 103 02/09/2020   CALCIUM 9.3 02/09/2020   MG 2.0 02/09/2020   PHOS 3.4 08/12/2012     Bone Lab Results  Component Value Date   VD25OH 26.5 (L) 02/07/2020     Inflammation (CRP: Acute Phase) (ESR: Chronic  Phase) Lab Results  Component Value Date   CRP 7.0 (H) 02/07/2020   ESRSEDRATE 60 (H) 02/07/2020   LATICACIDVEN 1.1 12/20/2019       Note: Above Lab results reviewed.  Imaging  CT CHEST ABDOMEN PELVIS W CONTRAST CLINICAL DATA:  69 year old female with delirium and weight loss. Concern for malignancy.  EXAM: CT CHEST, ABDOMEN, AND PELVIS WITH CONTRAST  TECHNIQUE: Multidetector CT imaging of the chest, abdomen and pelvis was performed following the standard protocol during bolus administration of intravenous contrast.  CONTRAST:  132m OMNIPAQUE IOHEXOL 300 MG/ML  SOLN  COMPARISON:  Chest CT dated 09/02/2019.  FINDINGS: CT CHEST FINDINGS  Cardiovascular: There is no cardiomegaly or pericardial effusion. There is calcification of the mitral annulus. The thoracic aorta is unremarkable. The origins of the great vessels of the aortic arch appear patent. There is common origin of the right brachiocephalic trunk and left common carotid artery. The central pulmonary arteries patent.  Mediastinum/Nodes: No hilar or mediastinal adenopathy. There is a small hiatal hernia. The esophagus is grossly unremarkable. No mediastinal fluid collection.  Lungs/Pleura: There is mild eventration of right hemidiaphragm with right lung base atelectasis. There is a cluster of nodularity in the left upper lobe anteriorly, likely chronic and sequela of prior inflammation/infection. No new consolidation. There is no pleural effusion pneumothorax. The central airways are patent.  Musculoskeletal: Right shoulder arthroplasty. No acute osseous pathology. Degenerative changes of spine.  CT ABDOMEN PELVIS FINDINGS  No intra-abdominal free air or free fluid.  Hepatobiliary: The liver is unremarkable. No intrahepatic biliary dilatation. Cholecystectomy. No retained calcified stone  noted in the central CBD.  Pancreas: Unremarkable. No pancreatic ductal dilatation or surrounding inflammatory  changes.  Spleen: Normal in size without focal abnormality.  Adrenals/Urinary Tract: Large bilateral adrenal masses demonstrating heterogeneous attenuation with predominantly fat content measuring 8.2 x 8.6 cm on the left and 8.6 x 6.0 cm on the right. Findings may represent adrenal myelolipoma, although fat containing adrenocortical carcinoma is not excluded. Clinical correlation and further characterization with MRI without and with contrast (adrenal mass protocol MRI) is recommended.  There is mild bilateral renal parenchyma atrophy. There is no hydronephrosis on either side. There is symmetric enhancement and excretion of contrast by both kidneys. There is a 5 mm nonobstructing stone in the left renal pelvis. The visualized ureters and urinary bladder appear unremarkable.  Stomach/Bowel: Postsurgical changes of gastric sleeve. There is no bowel obstruction or active inflammation. The appendix is normal.  Vascular/Lymphatic: Minimal atherosclerotic calcification of the aorta. The IVC is unremarkable. No portal venous gas. There is no adenopathy.  Reproductive: Hysterectomy. No adnexal masses.  Other: None  Musculoskeletal: Osteopenia with degenerative changes of the spine. No acute osseous pathology.  IMPRESSION: 1. No acute intrathoracic, abdominal, or pelvic pathology. 2. Large bilateral adrenal masses as described. Findings may represent adrenal myelolipoma, although fat containing adrenocortical carcinoma is not excluded. Clinical correlation and further characterization with adrenal mass protocol MRI is recommended. 3. Aortic Atherosclerosis (ICD10-I70.0).  Electronically Signed   By: Anner Crete M.D.   On: 02/07/2020 19:56  Assessment  The primary encounter diagnosis was Chronic pain syndrome. 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  Plan of Care  Problem-specific:  No problem-specific Assessment & Plan notes found for this encounter.  Ms. EARL ZELLMER has a current medication list which includes the following long-term medication(s): amlodipine, apixaban, bupropion, calcium-vitamin d, duloxetine, advair hfa, hydrocodone-acetaminophen, [START ON 03/20/2020] hydrocodone-acetaminophen, [START ON 04/19/2020] hydrocodone-acetaminophen, levocetirizine, levothyroxine, metformin, metoprolol succinate, montelukast, pregabalin, rosuvastatin, and sitagliptin.  Pharmacotherapy (Medications Ordered): Meds ordered this encounter  Medications  . HYDROcodone-acetaminophen (NORCO/VICODIN) 5-325 MG tablet    Sig: Take 1 tablet by mouth every 8 (eight) hours as needed for severe pain. Must last 30 days    Dispense:  90 tablet    Refill:  0    Chronic Pain: STOP Act (Not applicable) Fill 1 day early if closed on refill date. Avoid benzodiazepines within 8 hours of opioids  . HYDROcodone-acetaminophen (NORCO/VICODIN) 5-325 MG tablet    Sig: Take 1 tablet by mouth every 8 (eight) hours as needed for severe pain. Must last 30 days    Dispense:  90 tablet    Refill:  0    Chronic Pain: STOP Act (Not applicable) Fill 1 day early if closed on refill date. Avoid benzodiazepines within 8 hours of opioids  . HYDROcodone-acetaminophen (NORCO/VICODIN) 5-325 MG tablet    Sig: Take 1 tablet by mouth every 8 (eight) hours as needed for severe pain. Must last 30 days    Dispense:  90 tablet    Refill:  0    Chronic Pain: STOP Act (Not applicable) Fill 1 day early if closed on refill date. Avoid benzodiazepines within 8 hours of opioids  . DULoxetine (CYMBALTA) 60 MG capsule    Sig: Take 1 capsule (60 mg total) by mouth daily.  Dispense:  30 capsule    Refill:  2    Fill one  day early if pharmacy is closed on scheduled refill date. Generic permitted. Do not send renewal requests. Void any older duplicate prescription or refill(s) that may be on file.  . pregabalin (LYRICA) 50 MG capsule    Sig: Take 1 capsule (50 mg total) by mouth 3 (three) times daily.    Dispense:  90 capsule    Refill:  2    Fill one day early if pharmacy is closed on scheduled refill date. Generic permitted. Do not send renewal requests. Void any older duplicate prescription or refill(s) that may be on file.   Orders:  No orders of the defined types were placed in this encounter.  Follow-up plan:   No follow-ups on file.      Interventional therapies: Planned, scheduled, and/or pending:   Not at this time. (Always stop Pradaxa x 5 days prior to any blocks.)   Considering:   NOTE: PRADAXA ANTICOAGULATION (Stop: 5 days  Restart: 6 hours) NO LUMBAR RFA until BMI < 35. Diagnostic right suprascapular NB Diagnostic right IA knee injection  Possible series of 5 Hyalgan knee injections Diagnostic right-sided genicular NB Possible right-sided genicular nerve RFA  Diagnostic right-sided suprascapular NB  Possible right-sided suprascapular nerve RFA    Palliative PRN treatment(s):   Palliative right IA shoulder joint injection  Palliative right lateral femoral cutaneous NB Palliative right lateral femoral continuous nerve RFA #2     Recent Visits Date Type Provider Dept  11/22/19 Office Visit Gillis Santa, MD [facility]  Showing recent visits within past 90 days and meeting all other requirements Today's Visits Date Type Provider Dept  02/19/20 Telemedicine Milinda Pointer, MD [facility]  Showing today's visits and meeting all other requirements Future Appointments No visits were found meeting these conditions. Showing future appointments within next 90 days and meeting all other requirements  I discussed the assessment and treatment plan with the patient.  The patient was provided an opportunity to ask questions and all were answered. The patient agreed with the plan and demonstrated an understanding of the instructions.  Patient advised to call back or seek an in-person evaluation if the symptoms or condition worsens.  Duration of encounter: 15 minutes.  Note by: Gaspar Cola, MD Date: 02/19/2020; Time: 3:47 PM

## 2020-02-19 ENCOUNTER — Other Ambulatory Visit: Payer: Self-pay

## 2020-02-19 ENCOUNTER — Telehealth: Payer: Self-pay

## 2020-02-19 ENCOUNTER — Ambulatory Visit: Payer: PPO | Attending: Pain Medicine | Admitting: Pain Medicine

## 2020-02-19 DIAGNOSIS — I639 Cerebral infarction, unspecified: Secondary | ICD-10-CM

## 2020-02-19 DIAGNOSIS — M797 Fibromyalgia: Secondary | ICD-10-CM

## 2020-02-19 DIAGNOSIS — Z79899 Other long term (current) drug therapy: Secondary | ICD-10-CM | POA: Diagnosis not present

## 2020-02-19 DIAGNOSIS — G8194 Hemiplegia, unspecified affecting left nondominant side: Secondary | ICD-10-CM

## 2020-02-19 DIAGNOSIS — G5711 Meralgia paresthetica, right lower limb: Secondary | ICD-10-CM

## 2020-02-19 DIAGNOSIS — F418 Other specified anxiety disorders: Secondary | ICD-10-CM | POA: Diagnosis not present

## 2020-02-19 DIAGNOSIS — G894 Chronic pain syndrome: Secondary | ICD-10-CM

## 2020-02-19 DIAGNOSIS — E1142 Type 2 diabetes mellitus with diabetic polyneuropathy: Secondary | ICD-10-CM | POA: Diagnosis not present

## 2020-02-19 MED ORDER — PREGABALIN 50 MG PO CAPS: 50.0000 mg | ORAL_CAPSULE | Freq: Three times a day (TID) | ORAL | 2 refills | Status: DC

## 2020-02-19 MED ORDER — DULOXETINE HCL 60 MG PO CPEP: 60.0000 mg | ORAL_CAPSULE | Freq: Every day | ORAL | 2 refills | Status: DC

## 2020-02-19 MED ORDER — HYDROCODONE-ACETAMINOPHEN 5-325 MG PO TABS: 1.0000 | ORAL_TABLET | Freq: Three times a day (TID) | ORAL | 0 refills | Status: DC | PRN

## 2020-02-19 NOTE — Telephone Encounter (Signed)
Dawn Rossetti, NP with [name] went to pt's home to do post discharge visit-pt states that she had a fall on Saturday. She also states that she has fatigue and pt canceled neuro appt. Samandra TannP believes that pt needs to be seen sooner to be evaluated by Dr. Gayland Curry. She is concerned about pt's wellbeing. Please advise

## 2020-02-20 DIAGNOSIS — G4733 Obstructive sleep apnea (adult) (pediatric): Secondary | ICD-10-CM | POA: Diagnosis not present

## 2020-02-20 NOTE — Telephone Encounter (Signed)
Please advise 

## 2020-02-20 NOTE — Telephone Encounter (Signed)
Faxed to [facility] for certification  For [phone] to 03-29-2020

## 2020-02-20 NOTE — Telephone Encounter (Signed)
Do we have a sooner appt than scheduled?  Call the nurse back if pt declining rec go back to hospital or I can try to get her back into [facility] or another rehab facility with asap appt   [place]

## 2020-02-21 DIAGNOSIS — J45909 Unspecified asthma, uncomplicated: Secondary | ICD-10-CM | POA: Diagnosis not present

## 2020-02-21 DIAGNOSIS — N390 Urinary tract infection, site not specified: Secondary | ICD-10-CM | POA: Diagnosis not present

## 2020-02-21 DIAGNOSIS — Z7984 Long term (current) use of oral hypoglycemic drugs: Secondary | ICD-10-CM | POA: Diagnosis not present

## 2020-02-21 DIAGNOSIS — F419 Anxiety disorder, unspecified: Secondary | ICD-10-CM | POA: Diagnosis not present

## 2020-02-21 DIAGNOSIS — I69354 Hemiplegia and hemiparesis following cerebral infarction affecting left non-dominant side: Secondary | ICD-10-CM | POA: Diagnosis not present

## 2020-02-21 DIAGNOSIS — I480 Paroxysmal atrial fibrillation: Secondary | ICD-10-CM | POA: Diagnosis not present

## 2020-02-21 DIAGNOSIS — E039 Hypothyroidism, unspecified: Secondary | ICD-10-CM | POA: Diagnosis not present

## 2020-02-21 DIAGNOSIS — G4700 Insomnia, unspecified: Secondary | ICD-10-CM | POA: Diagnosis not present

## 2020-02-21 DIAGNOSIS — N3281 Overactive bladder: Secondary | ICD-10-CM | POA: Diagnosis not present

## 2020-02-21 DIAGNOSIS — Z7901 Long term (current) use of anticoagulants: Secondary | ICD-10-CM | POA: Diagnosis not present

## 2020-02-21 DIAGNOSIS — J309 Allergic rhinitis, unspecified: Secondary | ICD-10-CM | POA: Diagnosis not present

## 2020-02-21 DIAGNOSIS — K219 Gastro-esophageal reflux disease without esophagitis: Secondary | ICD-10-CM | POA: Diagnosis not present

## 2020-02-21 DIAGNOSIS — I10 Essential (primary) hypertension: Secondary | ICD-10-CM | POA: Diagnosis not present

## 2020-02-21 DIAGNOSIS — F329 Major depressive disorder, single episode, unspecified: Secondary | ICD-10-CM | POA: Diagnosis not present

## 2020-02-21 DIAGNOSIS — E1151 Type 2 diabetes mellitus with diabetic peripheral angiopathy without gangrene: Secondary | ICD-10-CM | POA: Diagnosis not present

## 2020-02-21 DIAGNOSIS — Z79899 Other long term (current) drug therapy: Secondary | ICD-10-CM | POA: Diagnosis not present

## 2020-02-21 DIAGNOSIS — E785 Hyperlipidemia, unspecified: Secondary | ICD-10-CM | POA: Diagnosis not present

## 2020-02-21 DIAGNOSIS — M109 Gout, unspecified: Secondary | ICD-10-CM | POA: Diagnosis not present

## 2020-02-21 DIAGNOSIS — G894 Chronic pain syndrome: Secondary | ICD-10-CM | POA: Diagnosis not present

## 2020-02-21 DIAGNOSIS — I69320 Aphasia following cerebral infarction: Secondary | ICD-10-CM | POA: Diagnosis not present

## 2020-02-21 DIAGNOSIS — E1140 Type 2 diabetes mellitus with diabetic neuropathy, unspecified: Secondary | ICD-10-CM | POA: Diagnosis not present

## 2020-02-21 DIAGNOSIS — R32 Unspecified urinary incontinence: Secondary | ICD-10-CM | POA: Diagnosis not present

## 2020-02-21 DIAGNOSIS — E559 Vitamin D deficiency, unspecified: Secondary | ICD-10-CM | POA: Diagnosis not present

## 2020-02-21 DIAGNOSIS — G4733 Obstructive sleep apnea (adult) (pediatric): Secondary | ICD-10-CM | POA: Diagnosis not present

## 2020-02-21 NOTE — Telephone Encounter (Signed)
Looked at the schedule and there are no sooner appointments than next week. This week is at 100%

## 2020-02-21 NOTE — Telephone Encounter (Signed)
Inform nurse

## 2020-02-22 NOTE — Telephone Encounter (Signed)
Nurse informed and verbalized understanding

## 2020-02-22 NOTE — Telephone Encounter (Addendum)
[facility] for physician to sign plan of care for 01-30-20 to 03-29-2020 faxed on 02-22-20

## 2020-02-23 ENCOUNTER — Other Ambulatory Visit (INDEPENDENT_AMBULATORY_CARE_PROVIDER_SITE_OTHER): Payer: Self-pay | Admitting: Vascular Surgery

## 2020-02-23 DIAGNOSIS — L97409 Non-pressure chronic ulcer of unspecified heel and midfoot with unspecified severity: Secondary | ICD-10-CM

## 2020-02-26 ENCOUNTER — Telehealth (INDEPENDENT_AMBULATORY_CARE_PROVIDER_SITE_OTHER): Payer: Self-pay

## 2020-02-26 NOTE — Telephone Encounter (Signed)
Please advise for the above message.

## 2020-02-26 NOTE — Telephone Encounter (Signed)
Cancelling the appointment is fine.  Unfortunately we are unable to do the ABIs outside of the office.  Ultimately, the patient should continue to follow wound care as advised by podiatry and when can follow up when they are able.

## 2020-02-26 NOTE — Telephone Encounter (Signed)
Faxed on 02-26-2020 to [facility] Resumption of care  For 01-30-2020 to 03-29-2020 faxed on 02-26-2020

## 2020-02-26 NOTE — Telephone Encounter (Signed)
The pt's husband called back and I made him aware of the NP's instructions.

## 2020-02-26 NOTE — Telephone Encounter (Signed)
Tried to call pt's husband and make him aware of the NP's instructions but the phone just rings continuously with no VM.

## 2020-02-27 ENCOUNTER — Ambulatory Visit: Payer: PPO | Admitting: Cardiovascular Disease

## 2020-02-27 ENCOUNTER — Ambulatory Visit (INDEPENDENT_AMBULATORY_CARE_PROVIDER_SITE_OTHER): Payer: Self-pay | Admitting: Nurse Practitioner

## 2020-02-27 ENCOUNTER — Encounter (INDEPENDENT_AMBULATORY_CARE_PROVIDER_SITE_OTHER): Payer: Self-pay

## 2020-02-27 ENCOUNTER — Other Ambulatory Visit: Payer: Self-pay

## 2020-02-27 DIAGNOSIS — E039 Hypothyroidism, unspecified: Secondary | ICD-10-CM

## 2020-02-27 DIAGNOSIS — J4540 Moderate persistent asthma, uncomplicated: Secondary | ICD-10-CM

## 2020-02-27 MED ORDER — MONTELUKAST SODIUM 10 MG PO TABS: 10.0000 mg | ORAL_TABLET | Freq: Every day | ORAL | 1 refills | Status: DC

## 2020-02-27 MED ORDER — LEVOTHYROXINE SODIUM 175 MCG PO TABS: 175.0000 ug | ORAL_TABLET | Freq: Every day | ORAL | 1 refills | Status: DC

## 2020-02-27 NOTE — Telephone Encounter (Signed)
Pt husband called they had to change her HFU appt to a phone visit  She is physical unable to come in per husband

## 2020-02-27 NOTE — Telephone Encounter (Signed)
For your information  

## 2020-02-28 ENCOUNTER — Telehealth: Payer: Self-pay | Admitting: Internal Medicine

## 2020-02-28 ENCOUNTER — Telehealth: Payer: PPO | Admitting: Internal Medicine

## 2020-02-28 ENCOUNTER — Other Ambulatory Visit: Payer: Self-pay

## 2020-02-28 NOTE — Telephone Encounter (Signed)
Patient 's husband called in stated that he is dropping off some paperwork from [place] that has to be filled out by Dr.McLean. He stated he will try and drop them off today

## 2020-02-28 NOTE — Telephone Encounter (Signed)
Noted Will await form drop off

## 2020-02-29 ENCOUNTER — Telehealth: Payer: Self-pay | Admitting: Internal Medicine

## 2020-03-01 ENCOUNTER — Telehealth (INDEPENDENT_AMBULATORY_CARE_PROVIDER_SITE_OTHER): Payer: PPO | Admitting: Internal Medicine

## 2020-03-01 ENCOUNTER — Telehealth: Payer: Self-pay | Admitting: Internal Medicine

## 2020-03-01 ENCOUNTER — Other Ambulatory Visit: Payer: Self-pay

## 2020-03-01 ENCOUNTER — Encounter: Payer: Self-pay | Admitting: Internal Medicine

## 2020-03-01 ENCOUNTER — Telehealth: Payer: Self-pay | Admitting: Adult Health Nurse Practitioner

## 2020-03-01 VITALS — BP 115/60 | Ht 65.0 in | Wt 220.0 lb

## 2020-03-01 DIAGNOSIS — E1140 Type 2 diabetes mellitus with diabetic neuropathy, unspecified: Secondary | ICD-10-CM | POA: Diagnosis not present

## 2020-03-01 DIAGNOSIS — E1151 Type 2 diabetes mellitus with diabetic peripheral angiopathy without gangrene: Secondary | ICD-10-CM

## 2020-03-01 DIAGNOSIS — E1351 Other specified diabetes mellitus with diabetic peripheral angiopathy without gangrene: Secondary | ICD-10-CM | POA: Diagnosis not present

## 2020-03-01 DIAGNOSIS — I69354 Hemiplegia and hemiparesis following cerebral infarction affecting left non-dominant side: Secondary | ICD-10-CM | POA: Diagnosis not present

## 2020-03-01 DIAGNOSIS — L97429 Non-pressure chronic ulcer of left heel and midfoot with unspecified severity: Secondary | ICD-10-CM

## 2020-03-01 DIAGNOSIS — G8194 Hemiplegia, unspecified affecting left nondominant side: Secondary | ICD-10-CM

## 2020-03-01 DIAGNOSIS — F32A Depression, unspecified: Secondary | ICD-10-CM

## 2020-03-01 DIAGNOSIS — F329 Major depressive disorder, single episode, unspecified: Secondary | ICD-10-CM | POA: Diagnosis not present

## 2020-03-01 DIAGNOSIS — M109 Gout, unspecified: Secondary | ICD-10-CM | POA: Diagnosis not present

## 2020-03-01 DIAGNOSIS — J45909 Unspecified asthma, uncomplicated: Secondary | ICD-10-CM | POA: Diagnosis not present

## 2020-03-01 DIAGNOSIS — I639 Cerebral infarction, unspecified: Secondary | ICD-10-CM

## 2020-03-01 DIAGNOSIS — M19012 Primary osteoarthritis, left shoulder: Secondary | ICD-10-CM

## 2020-03-01 DIAGNOSIS — N3281 Overactive bladder: Secondary | ICD-10-CM | POA: Diagnosis not present

## 2020-03-01 DIAGNOSIS — I10 Essential (primary) hypertension: Secondary | ICD-10-CM

## 2020-03-01 DIAGNOSIS — R5381 Other malaise: Secondary | ICD-10-CM

## 2020-03-01 DIAGNOSIS — G5711 Meralgia paresthetica, right lower limb: Secondary | ICD-10-CM | POA: Diagnosis not present

## 2020-03-01 DIAGNOSIS — I480 Paroxysmal atrial fibrillation: Secondary | ICD-10-CM

## 2020-03-01 DIAGNOSIS — E785 Hyperlipidemia, unspecified: Secondary | ICD-10-CM | POA: Diagnosis not present

## 2020-03-01 DIAGNOSIS — F339 Major depressive disorder, recurrent, unspecified: Secondary | ICD-10-CM | POA: Diagnosis not present

## 2020-03-01 DIAGNOSIS — I739 Peripheral vascular disease, unspecified: Secondary | ICD-10-CM

## 2020-03-01 DIAGNOSIS — E538 Deficiency of other specified B group vitamins: Secondary | ICD-10-CM

## 2020-03-01 DIAGNOSIS — M67912 Unspecified disorder of synovium and tendon, left shoulder: Secondary | ICD-10-CM

## 2020-03-01 DIAGNOSIS — G4733 Obstructive sleep apnea (adult) (pediatric): Secondary | ICD-10-CM | POA: Diagnosis not present

## 2020-03-01 DIAGNOSIS — E559 Vitamin D deficiency, unspecified: Secondary | ICD-10-CM

## 2020-03-01 DIAGNOSIS — G894 Chronic pain syndrome: Secondary | ICD-10-CM | POA: Diagnosis not present

## 2020-03-01 DIAGNOSIS — G4700 Insomnia, unspecified: Secondary | ICD-10-CM | POA: Diagnosis not present

## 2020-03-01 DIAGNOSIS — F419 Anxiety disorder, unspecified: Secondary | ICD-10-CM

## 2020-03-01 DIAGNOSIS — E1142 Type 2 diabetes mellitus with diabetic polyneuropathy: Secondary | ICD-10-CM

## 2020-03-01 DIAGNOSIS — K219 Gastro-esophageal reflux disease without esophagitis: Secondary | ICD-10-CM | POA: Diagnosis not present

## 2020-03-01 DIAGNOSIS — J309 Allergic rhinitis, unspecified: Secondary | ICD-10-CM | POA: Diagnosis not present

## 2020-03-01 DIAGNOSIS — I69320 Aphasia following cerebral infarction: Secondary | ICD-10-CM | POA: Diagnosis not present

## 2020-03-01 DIAGNOSIS — Z7901 Long term (current) use of anticoagulants: Secondary | ICD-10-CM | POA: Diagnosis not present

## 2020-03-01 DIAGNOSIS — R32 Unspecified urinary incontinence: Secondary | ICD-10-CM | POA: Diagnosis not present

## 2020-03-01 DIAGNOSIS — E1159 Type 2 diabetes mellitus with other circulatory complications: Secondary | ICD-10-CM

## 2020-03-01 DIAGNOSIS — Z79899 Other long term (current) drug therapy: Secondary | ICD-10-CM | POA: Diagnosis not present

## 2020-03-01 DIAGNOSIS — R296 Repeated falls: Secondary | ICD-10-CM

## 2020-03-01 DIAGNOSIS — I152 Hypertension secondary to endocrine disorders: Secondary | ICD-10-CM

## 2020-03-01 DIAGNOSIS — E039 Hypothyroidism, unspecified: Secondary | ICD-10-CM | POA: Diagnosis not present

## 2020-03-01 DIAGNOSIS — Z9989 Dependence on other enabling machines and devices: Secondary | ICD-10-CM

## 2020-03-01 DIAGNOSIS — N390 Urinary tract infection, site not specified: Secondary | ICD-10-CM | POA: Diagnosis not present

## 2020-03-01 DIAGNOSIS — Z7984 Long term (current) use of oral hypoglycemic drugs: Secondary | ICD-10-CM | POA: Diagnosis not present

## 2020-03-01 NOTE — Progress Notes (Addendum)
Telephone Note  I connected with [name]  on 03/01/20 at 10:20 AM EST by telephone and verified that I am speaking with the correct person using two identifiers.  Location patient: home, [place] Location provider:work or home office Persons participating in the virtual visit: patient, provider  I discussed the limitations of evaluation and management by telemedicine and the availability of in person appointments. The patient expressed understanding and agreed to proceed.   HPI: HFU admitted [facility] 02/04/20 to 02/10/20 recurrent stroke posterior left frontal lobe and 12/21/19 hosp admit for stroke neurology thinks vitamin B12 def led to hypercoagulable state but wanted to r/o malignancy as cause CT chest/ab/pelvis 02/07/20 negative but showed chronic and stable adrenal masses on eliquis and needs outpatient neurology f/u pt declined SN[facility] due to cost and h/H PT/OT/RN/Aid/SW arranged and was supposed to go home with hospital bed and hoyer lift and wheelchair. Pt also needs to f/u with ortho left shoulder pain and left achilles wound podiatry Dr. Troxler/Fowler and vascular Dr. Lucky Cowboy to repeat ABIs  Gait is abnormal due to recurrent strokes (new since 11/2019) but also due to chronic would left achilles and B12 126 low/folate 4.2 low contributing to neuropathy. She is supposed to get B12 injections daily x 7 days then oral replacement and consider EMG/NCS outpatient. Also she has vitamin D def 26.5 low  Anxiety/depression worse since having strokes recurrent since 11/2019 and 01/2020. Second stroke really affected mood as she is unable to do for herself adls and iadls and bake like she normally would during these time and shes depressed she cant walk and having recurrent falls.   PT/OT/RN/SW from [facility] supposed to be at her home but not seen yet. She is supposed to have hoyer lift, hospital bed, wheelchair, transfer bench and walker with seat. 1st stroke 23 visit approved and she reports now  only 140-15 visits approved. It is hard for her to walk a long distance and she is looking into PCS with a family friend who works for [employer] and will pay her and go through [employer] if needed to    HTN BP held hyzaar 50-12.5 mg qd at discharge due to hypotension as BP was normal in hospital and to f/u with PCP to resume if needed   Afib rate controlled in hospital on eliquis   OSA cpap ordered   She is supposed to f/u with ortho Dr. Roland Rack left shoulder prn (she had to reschedule this), podiatry [facility] left achilles wound (f/u sch in 2 weeks), [facility] neurology (see above neuropathy/strokes pt had to reschedule) and Dr. Lucky Cowboy vascular left achilles wound for abis pt had to reschedule  -most of appts having to do televisit or reschedule due to mobility and husband has trouble getting her out of bed for appts due to limited mobility and his health  ROS: See pertinent positives and negatives per HPI.  Past Medical History:  Diagnosis Date  . Abnormal antibody titer   . Anxiety and depression   . Arthritis   . Asthma   . Cystocele   . Depression   . Diabetes ([place])   . DVT (deep venous thrombosis) (HCC)    Righ calf  . Dyspnea    11/08/2018 - "more now due to lack of exercise"  . Dysrhythmia    afib  . Edema   . GERD (gastroesophageal reflux disease)   . Heart murmur    Echocardiogram June 2015: Mild MR (possible vegetation seen on TTE, not seen on TEE),  normal LV size with moderate concentric LVH. Normal function EF 60-65%. Normal diastolic function. Mild LA dilation.  Marland Kitchen History of IBS   . History of kidney stones   . History of methicillin resistant staphylococcus aureus (MRSA) 2008  . Hyperlipidemia   . Hypertension   . Hypothyroidism   . Insomnia   . Kidney stone    kidney stones with lithotripsy .  [facility]- "adrenal glands"  . Neuropathy involving both lower extremities   . Obesity, Class II, BMI 35-39.9, with comorbidity   . Paroxysmal atrial fibrillation ([place]) 2007   In June  2015, Cardiac Event Monitor: Mostly SR/sinus arrhythmia with PVCs that are frequent. Short bursts of A. fib lasting several minutes;; CHA2DS2-VASc Score = 5 (age, Female, PVD, DM, HTN)  . Peripheral vascular disease ([place])   . Sleep apnea    use C-PAP  . Urinary incontinence   . Venous stasis dermatitis of both lower extremities     Past Surgical History:  Procedure Laterality Date  . ANKLE SURGERY Right   . APPLICATION OF WOUND VAC Left 06/27/2015   Procedure: APPLICATION OF WOUND VAC ( POSSIBLE ) ;  Surgeon: [name], MD;  Location: [facility] ORS;  Service: Vascular;  Laterality: Left;  . APPLICATION OF WOUND VAC Left 09/25/2016   Procedure: APPLICATION OF WOUND VAC;  Surgeon: [name], DPM;  Location: [facility] ORS;  Service: Podiatry;  Laterality: Left;  . arm surgery     right    . CHOLECYSTECTOMY    . COLONOSCOPY    . COLONOSCOPY WITH PROPOFOL N/A 01/11/2017   Procedure: COLONOSCOPY WITH PROPOFOL;  Surgeon: [name], MD;  Location: [facility] ENDOSCOPY;  Service: Endoscopy;  Laterality: N/A;  . COLONOSCOPY WITH PROPOFOL N/A 02/22/2017   Procedure: COLONOSCOPY WITH PROPOFOL;  Surgeon: [name], MD;  Location: [facility] ENDOSCOPY;  Service: Endoscopy;  Laterality: N/A;  . COLONOSCOPY WITH PROPOFOL N/A 05/31/2017   Procedure: COLONOSCOPY WITH PROPOFOL;  Surgeon: [name], MD;  Location: [facility] ENDOSCOPY;  Service: Endoscopy;  Laterality: N/A;  . ESOPHAGOGASTRODUODENOSCOPY (EGD) WITH PROPOFOL N/A 01/11/2017   Procedure: ESOPHAGOGASTRODUODENOSCOPY (EGD) WITH PROPOFOL;  Surgeon: [name], MD;  Location: [facility] ENDOSCOPY;  Service: Endoscopy;  Laterality: N/A;  . ESOPHAGOGASTRODUODENOSCOPY (EGD) WITH PROPOFOL N/A 10/25/2018   Procedure: ESOPHAGOGASTRODUODENOSCOPY (EGD) WITH PROPOFOL;  Surgeon: [name], MD;  Location: [facility] ENDOSCOPY;  Service: Endoscopy;  Laterality: N/A;  . ESOPHAGOGASTRODUODENOSCOPY (EGD) WITH PROPOFOL N/A 11/29/2019   Procedure:  ESOPHAGOGASTRODUODENOSCOPY (EGD) WITH PROPOFOL;  Surgeon: Toledo, Benay Pike, MD;  Location: [facility] ENDOSCOPY;  Service: Gastroenterology;  Laterality: N/A;  . HERNIA REPAIR     umbilical  . HIATAL HERNIA REPAIR    . I & D EXTREMITY Left 06/27/2015   Procedure: IRRIGATION AND DEBRIDEMENT EXTREMITY            ( CALF HEMATOMA ) POSSIBLE WOUND VAC;  Surgeon: [name], MD;  Location: [facility] ORS;  Service: Vascular;  Laterality: Left;  . INCISION AND DRAINAGE OF WOUND Left 09/25/2016   Procedure: IRRIGATION AND DEBRIDEMENT - PARTIAL RESECTION OF ACHILLES TENDON WITH WOUND VAC APPLICATION;  Surgeon: [name], DPM;  Location: [facility] ORS;  Service: Podiatry;  Laterality: Left;  . INCISION AND DRAINAGE OF WOUND Left 11/09/2018   Procedure: Excision of left foot wound with ACell placement;  Surgeon: Wallace Going, DO;  Location: [place];  Service: Plastics;  Laterality: Left;  . IRRIGATION AND DEBRIDEMENT ABSCESS Left 09/07/2016   Procedure: IRRIGATION AND  DEBRIDEMENT ABSCESS LEFT HEEL;  Surgeon: [name], DPM;  Location: [facility] ORS;  Service: Podiatry;  Laterality: Left;  . JOINT REPLACEMENT  2013   left knee replacement  . KIDNEY SURGERY Right    kidney stones  . LITHOTRIPSY    . LOWER EXTREMITY ANGIOGRAPHY Left 04/04/2018   Procedure: LOWER EXTREMITY ANGIOGRAPHY;  Surgeon: [name], MD;  Location: [place] CV LAB;  Service: Cardiovascular;  Laterality: Left;  . NM GATED MYOVIEW ([place] [facility])  February 2017   Likely breast attenuation. LOW RISK study. Normal EF 55-60%.  . OTHER SURGICAL HISTORY     08/2016 or 09/2016 surgery on achilles tenso h/o staph infection heal. Dr. [name]  . removal of left hematoma Left    leg  . REVERSE SHOULDER ARTHROPLASTY Right 10/08/2015   Procedure: REVERSE SHOULDER ARTHROPLASTY;  Surgeon: [name], MD;  Location: [facility] ORS;  Service: Orthopedics;  Laterality: Right;  . SLEEVE GASTROPLASTY    . TONSILLECTOMY    . TOTAL KNEE ARTHROPLASTY Left   .  TOTAL SHOULDER REPLACEMENT Right   . TRANSESOPHAGEAL ECHOCARDIOGRAM  08/08/2013   Mild LVH, EF 60-65%. Moderate LA dilation and mild RA dilation. Mild MR with no evidence of stenosis and no evidence of endocarditis. A false chordae is noted.  . TRANSTHORACIC ECHOCARDIOGRAM  08/03/2013   Mild-Moderate concentric LVH, EF 60-65%. Normal diastolic function. Mild LA dilation. Mild MR with possible vegetation  - not confirmed on TEE   . ULNAR NERVE TRANSPOSITION Right 08/06/2016   Procedure: ULNAR NERVE DECOMPRESSION/TRANSPOSITION;  Surgeon: [name], MD;  Location: [facility] ORS;  Service: Orthopedics;  Laterality: Right;  . UPPER GI ENDOSCOPY    . VAGINAL HYSTERECTOMY       Current Outpatient Medications:  .  acetaminophen (TYLENOL) 325 MG tablet, Take 2 tablets (650 mg total) by mouth every 6 (six) hours as needed for mild pain or moderate pain (or Fever >/= 101)., Disp: , Rfl:  .  albuterol (PROVENTIL) (2.5 MG/3ML) 0.083% nebulizer solution, Take 2.5 mg by nebulization every 4 (four) hours as needed for wheezing or shortness of breath., Disp: , Rfl:  .  ALPRAZolam (XANAX) 0.5 MG tablet, Take 0.5 mg by mouth at bedtime as needed for anxiety or sleep., Disp: , Rfl:  .  amLODipine (NORVASC) 5 MG tablet, Take 1 tablet (5 mg total) by mouth daily., Disp: 90 tablet, Rfl: 1 .  apixaban (ELIQUIS) 5 MG TABS tablet, Take 1 tablet (5 mg total) by mouth 2 (two) times daily., Disp: 180 tablet, Rfl: 2 .  buPROPion (WELLBUTRIN XL) 150 MG 24 hr tablet, Take 1 tablet (150 mg total) by mouth daily., Disp: 90 tablet, Rfl: 3 .  calcium-vitamin D (OSCAL WITH D) 500-200 MG-UNIT tablet, Take 1 tablet by mouth 2 (two) times daily with a meal., Disp: 60 tablet, Rfl: 0 .  cholecalciferol (VITAMIN D) 25 MCG tablet, Take 1 tablet (1,000 Units total) by mouth 2 (two) times daily with a meal., Disp: 30 tablet, Rfl: 2 .  clobetasol cream (TEMOVATE) 3.08 %, Apply 1 application topically 2 (two) times daily as needed (skin  irritation). , Disp: , Rfl:  .  colchicine 0.6 MG tablet, Take 0.6 mg by mouth 2 (two) times daily as needed (acute gout flare). , Disp: , Rfl:  .  diclofenac Sodium (VOLTAREN) 1 % GEL, Apply 2 g topically 3 (three) times daily., Disp: 100 g, Rfl: 2 .  DULoxetine (CYMBALTA) 60 MG capsule, Take 1 capsule (60 mg total)  by mouth daily., Disp: 30 capsule, Rfl: 2 .  estradiol (ESTRACE) 0.1 MG/GM vaginal cream, Place 1 Applicatorful vaginally every Monday, Wednesday, and Friday. Apply 0.5mg  (pea-sized amount)  just inside the vaginal introitus with a finger-tip on Monday, Wednesday and Friday nights,, Disp: 42.5 g, Rfl: 11 .  fluticasone-salmeterol (ADVAIR HFA) 115-21 MCG/ACT inhaler, Inhale 2 puffs into the lungs daily. Rinse mouth thoroughly after use, Disp: 1 Inhaler, Rfl: 12 .  folic acid (FOLVITE) 1 MG tablet, Take 1 tablet (1 mg total) by mouth daily., Disp: 30 tablet, Rfl: 2 .  HYDROcodone-acetaminophen (NORCO/VICODIN) 5-325 MG tablet, Take 1 tablet by mouth every 8 (eight) hours as needed for severe pain. Must last 30 days, Disp: 90 tablet, Rfl: 0 .  [START ON 03/20/2020] HYDROcodone-acetaminophen (NORCO/VICODIN) 5-325 MG tablet, Take 1 tablet by mouth every 8 (eight) hours as needed for severe pain. Must last 30 days, Disp: 90 tablet, Rfl: 0 .  [START ON 04/19/2020] HYDROcodone-acetaminophen (NORCO/VICODIN) 5-325 MG tablet, Take 1 tablet by mouth every 8 (eight) hours as needed for severe pain. Must last 30 days, Disp: 90 tablet, Rfl: 0 .  levocetirizine (XYZAL) 5 MG tablet, Take 0.5 tablets (2.5 mg total) by mouth every evening. (Patient taking differently: Take 5 mg by mouth at bedtime as needed for allergies.), Disp: 45 tablet, Rfl: 3 .  levothyroxine (SYNTHROID) 175 MCG tablet, Take 1 tablet (175 mcg total) by mouth daily before breakfast., Disp: 90 tablet, Rfl: 1 .  losartan-hydrochlorothiazide (HYZAAR) 50-12.5 MG tablet, Take 1 tablet by mouth daily., Disp: , Rfl:  .  metFORMIN (GLUCOPHAGE) 500  MG tablet, Take 1 tablet (500 mg total) by mouth daily with breakfast. With food, Disp: 90 tablet, Rfl: 3 .  metoprolol succinate (TOPROL-XL) 50 MG 24 hr tablet, TAKE 1 TABLET BY MOUTH DAILY (Patient taking differently: Take 50 mg by mouth daily.), Disp: 90 tablet, Rfl: 1 .  Misc. Devices (TUB TRANSFER BOARD) MISC, 1 Device by Does not apply route as needed., Disp: 1 each, Rfl: 0 .  montelukast (SINGULAIR) 10 MG tablet, Take 1 tablet (10 mg total) by mouth at bedtime., Disp: 90 tablet, Rfl: 1 .  Multiple Vitamin (MULTIVITAMIN WITH MINERALS) TABS tablet, Take 1 tablet by mouth daily., Disp: , Rfl:  .  oxybutynin (DITROPAN-XL) 10 MG 24 hr tablet, Take 10 mg by mouth at bedtime., Disp: , Rfl:  .  pantoprazole (PROTONIX) 40 MG tablet, Take 40 mg by mouth daily. , Disp: , Rfl:  .  pregabalin (LYRICA) 50 MG capsule, Take 1 capsule (50 mg total) by mouth 3 (three) times daily., Disp: 90 capsule, Rfl: 2 .  rosuvastatin (CRESTOR) 20 MG tablet, Take 1 tablet (20 mg total) by mouth daily., Disp: 30 tablet, Rfl: 5 .  sitaGLIPtin (JANUVIA) 25 MG tablet, Take 1 tablet (25 mg total) by mouth daily., Disp: 90 tablet, Rfl: 3 .  sucralfate (CARAFATE) 1 GM/10ML suspension, Take 1 g by mouth 3 (three) times daily. , Disp: , Rfl:  .  thiamine 100 MG tablet, Take 0.5 tablets (50 mg total) by mouth 2 (two) times daily with a meal., Disp: 60 tablet, Rfl: 2 .  TOVIAZ 8 MG TB24 tablet, TAKE ONE TABLET BY MOUTH EVERY DAY (Patient taking differently: Take 8 mg by mouth daily.), Disp: 90 tablet, Rfl: 3 .  trospium (SANCTURA) 20 MG tablet, Take 20 mg by mouth 2 (two) times daily., Disp: , Rfl:  .  vitamin B-12 1000 MCG tablet, Take 1 tablet (1,000 mcg total) by  mouth daily., Disp: 30 tablet, Rfl: 2 .  ergocalciferol (VITAMIN D2) 1.25 MG (50000 UT) capsule, Take 50,000 Units by mouth once a week. (Patient not taking: Reported on 03/01/2020), Disp: , Rfl:  .  ondansetron (ZOFRAN) 4 MG tablet, Take 1 tablet (4 mg total) by mouth  every 8 (eight) hours as needed for nausea or vomiting. (Patient not taking: Reported on 03/01/2020), Disp: 90 tablet, Rfl: 1  Current Facility-Administered Medications:  .  ipratropium-albuterol (DUONEB) 0.5-2.5 (3) MG/3ML nebulizer solution 3 mL, 3 mL, Nebulization, Q6H, [name], [name], MD  EXAMTonette Bihari per patient if applicable:  GENERAL: alert, oriented, appears well and in no acute distress  PSYCH/NEURO: pleasant and cooperative, no obvious depression or anxiety, speech and thought processing grossly intact  MSK: left hemiparesis due to stroke and bed bound relies of assistance for mobility using rollator but not enough not able to get out of bed and ambulate on her own s/p recurrent strokes since 11/2019 and 01/2020 and mobility very limited  ASSESSMENT AND PLAN:  Discussed the following assessment and plan:  Cerebrovascular accident (CVA), unspecified mechanism ([place]) with left hemiparesis, recurrent falls and limited mobility  also with neuropathy ?B12, folate related vs other I.e diabetic, idiopathic - Plan: Amb Referral to Palliative Care, Ambulatory referral to [place] for PCS unclear if insurance will cover this  Pt needs to f/u with [facility] neurology and consider EMG for neuropathy  Pt should be on B12, folate supplementation per hospital d/c  Rotator cuff disorder, left/arthritis  -will need to f/u with Dr. Roland Rack   Depression, recurrent [facility]) - Plan: Amb Referral to Palliative Care; Ambulatory referral to [place] she has [facility] for PT/OT/RN/SW but feels if had more help at home this would help mood  PAD (peripheral artery disease) ([place]) - Plan: Amb Referral to Palliative Care Ulcer of left heel and midfoot, unspecified ulcer stage ( Needs to f/u with [facility] podiatry and Dr. Lucky Cowboy   Paroxysmal atrial fibrillation [facility]); CHA2DS2-Vasc Score 5. On ELIQUIS not PRADAXA had stroke on pradaxa - Plan: Amb Referral to Palliative Care F/u cards    Essential  hypertension - Plan: Amb Referral to Palliative Care hyzaar was held at discharge 50-12.5 mg qd  On norvasc 5 mg qd  toprol 50 mg xl  She needs repeat BMET with GFR and CBC with diff anemia with low iron sat  She is unable to come in for labs due to bed bound will see if palliative care can do labs or [facility]  Results for GIABELLA, DUHART (MRN [id number]) as of 03/11/2020 21:32  Ref. Range 02/08/2020 05:57  Saturation Ratios Latest Ref Range: 10.4 - 31.8 % 8 (L)  Should be taking oral iron supplementation    DM (diabetes mellitus) type II, controlled, with peripheral vascular disorder (HCC) -  On metformin 500 mg qd and januvia 25 mg qd  02/06/20 a1c 7.1   Physical deconditioning - Plan: Amb Referral to Palliative Care, Ambulatory referral to [facility] for PCS  Has [facility] PT/OT/RN/SW   Recurrent falls and limited mobility - Plan: Amb Referral to Palliative Care, Ambulatory referral to [place] PCS It is becoming very difficult and unsafe to manage this patient via televisits I am willing to follow along in her care but she needs PC at home will place urgent referral She reports  She heeds a hoyer lift to get her in and out of the hospital bed as of 03/08/20 per husband per [facility]  As of 03/25/20 hospice referral placed her PC nurse  Dr. [name]  I had a telephone visit with Ms. Estrella this morning and we discussed options with getting help in the home. Horris Latino with [facility] was in the home and she called me and has a different picture of what is going on and has suggested hospice and the husband would like to get a referral for hospice if you are in agreement with that. I am quarantining at this time and am unable to do a in person visit with them and they say the are not able to do a virtual visit. The referral fax number for our hospice is [phone]. Call me at [phone] with any questions.  Thank you  Hanley Ben NP    Vitamin D deficiency vitamin D def  26.5 low she needs to take 4000 IU daily otc   B12 deficiency 02/06/20 126 low really should be getting B12 injections in hosp daily x 7 days or could do weekly x 1 month then Q30 days  S/p gastric sleeve and vitamin Def in multiple vitamins   Folate deficiency rec 1000 mg daily or 1 gram daily otc   Anxiety and depression Cont meds help with adls and idls will help per pt   OSA on CPAP Ordered  [facility] hospital stay 01/2020   HM Flu shot utd utdpna 23, prevnar Tdaputd considershingrix in future Given Rx twinrixin pastto get at pharmacy h/o fatty liver covid had 2/2 pfizer consider booster  DEXA 10/17/14 normal  Out of age windowpapOb/gyn  mammo neg5/20/19 negative -referred previously5/27/21 negative   05/31/17 [facility] GIcolonoscopy with diverticulosis and polypsmultiple tubular and hyperplastic due in 2023  EGD had 10/25/2018 repeat due in 1 year [facility] Dr. Gustavo Lah    -we discussed possible serious and likely etiologies, options for evaluation and workup, limitations of telemedicine visit vs in person visit, treatment, treatment risks and precautions.     I discussed the assessment and treatment plan with the patient. The patient was provided an opportunity to ask questions and all were answered. The patient agreed with the plan and demonstrated an understanding of the instructions.    Time spent 30 minutes Delorise Jackson, MD

## 2020-03-01 NOTE — Patient Instructions (Addendum)
02/19/2020 Initial consult [facility]  [place], [place] [address]  [phone]  Ray Church, [place]  [facility]  [place], [place] [address]  [phone] (Work)  [phone] (Fax)    Kristine Linea, [place], [place]  [name]  [place], [place] [address]  [phone] (Work)  [phone] (Fax)    -call to reschedule neurology     01/25/2020 Initial consult [facility]  [address]  [place], [place] [address]  [phone]  Langston Reusing, [place]  [place], [place] [address]  [phone] (Work)  [phone] ([address])    Scarlett Presto, [place]  [place], [place] [address]  [phone] (Work)  [phone] (Fax)  Arthritis of left glenohumeral joint (Primary Dx);  Rotator cuff tendinitis, left;  Neck pain;  Diabetic peripheral neuropathy associated with type 2 diabetes mellitus (CMS-HCC);  Morbid obesity with BMI of 40.0-44.9, adult     Voltaren gel 4x per day to left shoulder   Call to reschedule vascular surgery appt with Dr. Lucky Cowboy [phone]  12/11/2019 Office Visit [facility]  [place], [place] [address]  [phone]  Oran Rein, [place]  [facility],  [address]  [phone] (Work)  [phone] (Fax)  Type 2 diabetes mellitus with polyneuropathy (CMS-HCC) (Primary Dx);  Neurotrophic ulcer of left foot limited to breakdown of skin (CMS-HCC);    -follow up the the foot doctor   Please check with your insurance about companies for personal care services for an aide -Touched by an Building services engineer  -[facility]  -visiting [facility]  -[facility]  -options for senior [place]  -[facility]  -[facility]  -[facility] hosp[facility]

## 2020-03-01 NOTE — Telephone Encounter (Signed)
Patient's husband dropped off department of [place] paper work. Paper is up front in color folder.

## 2020-03-01 NOTE — Telephone Encounter (Signed)
Patient called in to let Dr.Mclean know that her husband is dropping of [place] paperwork today 03-01-20

## 2020-03-01 NOTE — Telephone Encounter (Signed)
Spoke with patient regarding Palliative referral/services and all questions were answered and she was in agreement with scheduling visit.  I have scheduled an In-person Consult for 03/12/20 @ 8:30 AM.

## 2020-03-01 NOTE — Telephone Encounter (Signed)
Placed on your desk. 

## 2020-03-01 NOTE — Telephone Encounter (Signed)
lft vm for pt to call [facility] regarding referral. 

## 2020-03-05 NOTE — Telephone Encounter (Signed)
Faxed on 03-05-20 to [facility] for certifcation [phone] to 03-29-2020 pt effective [id number] faxed on 03-05-20

## 2020-03-06 DIAGNOSIS — I69320 Aphasia following cerebral infarction: Secondary | ICD-10-CM | POA: Diagnosis not present

## 2020-03-06 DIAGNOSIS — G894 Chronic pain syndrome: Secondary | ICD-10-CM | POA: Diagnosis not present

## 2020-03-06 DIAGNOSIS — G4733 Obstructive sleep apnea (adult) (pediatric): Secondary | ICD-10-CM | POA: Diagnosis not present

## 2020-03-06 DIAGNOSIS — G4700 Insomnia, unspecified: Secondary | ICD-10-CM | POA: Diagnosis not present

## 2020-03-06 DIAGNOSIS — E559 Vitamin D deficiency, unspecified: Secondary | ICD-10-CM | POA: Diagnosis not present

## 2020-03-06 DIAGNOSIS — F329 Major depressive disorder, single episode, unspecified: Secondary | ICD-10-CM | POA: Diagnosis not present

## 2020-03-06 DIAGNOSIS — M109 Gout, unspecified: Secondary | ICD-10-CM | POA: Diagnosis not present

## 2020-03-06 DIAGNOSIS — J309 Allergic rhinitis, unspecified: Secondary | ICD-10-CM | POA: Diagnosis not present

## 2020-03-06 DIAGNOSIS — E039 Hypothyroidism, unspecified: Secondary | ICD-10-CM | POA: Diagnosis not present

## 2020-03-06 DIAGNOSIS — I480 Paroxysmal atrial fibrillation: Secondary | ICD-10-CM | POA: Diagnosis not present

## 2020-03-06 DIAGNOSIS — K219 Gastro-esophageal reflux disease without esophagitis: Secondary | ICD-10-CM | POA: Diagnosis not present

## 2020-03-06 DIAGNOSIS — N3281 Overactive bladder: Secondary | ICD-10-CM | POA: Diagnosis not present

## 2020-03-06 DIAGNOSIS — E785 Hyperlipidemia, unspecified: Secondary | ICD-10-CM | POA: Diagnosis not present

## 2020-03-06 DIAGNOSIS — F419 Anxiety disorder, unspecified: Secondary | ICD-10-CM | POA: Diagnosis not present

## 2020-03-06 DIAGNOSIS — E1140 Type 2 diabetes mellitus with diabetic neuropathy, unspecified: Secondary | ICD-10-CM | POA: Diagnosis not present

## 2020-03-06 DIAGNOSIS — N390 Urinary tract infection, site not specified: Secondary | ICD-10-CM | POA: Diagnosis not present

## 2020-03-06 DIAGNOSIS — I10 Essential (primary) hypertension: Secondary | ICD-10-CM | POA: Diagnosis not present

## 2020-03-06 DIAGNOSIS — R32 Unspecified urinary incontinence: Secondary | ICD-10-CM | POA: Diagnosis not present

## 2020-03-06 DIAGNOSIS — Z7901 Long term (current) use of anticoagulants: Secondary | ICD-10-CM | POA: Diagnosis not present

## 2020-03-06 DIAGNOSIS — Z7984 Long term (current) use of oral hypoglycemic drugs: Secondary | ICD-10-CM | POA: Diagnosis not present

## 2020-03-06 DIAGNOSIS — I69354 Hemiplegia and hemiparesis following cerebral infarction affecting left non-dominant side: Secondary | ICD-10-CM | POA: Diagnosis not present

## 2020-03-06 DIAGNOSIS — E1151 Type 2 diabetes mellitus with diabetic peripheral angiopathy without gangrene: Secondary | ICD-10-CM | POA: Diagnosis not present

## 2020-03-06 DIAGNOSIS — Z79899 Other long term (current) drug therapy: Secondary | ICD-10-CM | POA: Diagnosis not present

## 2020-03-06 DIAGNOSIS — J45909 Unspecified asthma, uncomplicated: Secondary | ICD-10-CM | POA: Diagnosis not present

## 2020-03-08 ENCOUNTER — Telehealth: Payer: Self-pay

## 2020-03-08 NOTE — Telephone Encounter (Signed)
Pt's husband states that [place] [facility] told him that pt needs Dr [facility] to order a Harrel Lemon Lift to get her in and out of the hospital bed.

## 2020-03-11 DIAGNOSIS — I739 Peripheral vascular disease, unspecified: Secondary | ICD-10-CM | POA: Insufficient documentation

## 2020-03-11 DIAGNOSIS — L97429 Non-pressure chronic ulcer of left heel and midfoot with unspecified severity: Secondary | ICD-10-CM | POA: Insufficient documentation

## 2020-03-11 DIAGNOSIS — G4733 Obstructive sleep apnea (adult) (pediatric): Secondary | ICD-10-CM | POA: Diagnosis not present

## 2020-03-11 DIAGNOSIS — E538 Deficiency of other specified B group vitamins: Secondary | ICD-10-CM | POA: Insufficient documentation

## 2020-03-11 NOTE — Telephone Encounter (Signed)
Hi this patient was discharged in 01/2020 with your team Husband calling back stating he needs [name] lift but this was listed in discharge as being coordinated for the patient  Can you all help assist with getting her this please?   Thank you

## 2020-03-11 NOTE — Progress Notes (Signed)
Pt appt moved 02/28/20 to 03/01/20 see 03/01/20 note   Dr. Olivia Mackie [name]

## 2020-03-11 NOTE — Telephone Encounter (Signed)
Please advise is a visit needed to order this?

## 2020-03-12 ENCOUNTER — Telehealth: Payer: Self-pay | Admitting: Internal Medicine

## 2020-03-12 ENCOUNTER — Telehealth: Payer: Self-pay

## 2020-03-12 ENCOUNTER — Other Ambulatory Visit: Payer: Self-pay

## 2020-03-12 ENCOUNTER — Other Ambulatory Visit: Payer: PPO | Admitting: Adult Health Nurse Practitioner

## 2020-03-12 DIAGNOSIS — Z515 Encounter for palliative care: Secondary | ICD-10-CM | POA: Diagnosis not present

## 2020-03-12 DIAGNOSIS — F339 Major depressive disorder, recurrent, unspecified: Secondary | ICD-10-CM

## 2020-03-12 DIAGNOSIS — M79602 Pain in left arm: Secondary | ICD-10-CM

## 2020-03-12 DIAGNOSIS — G8194 Hemiplegia, unspecified affecting left nondominant side: Secondary | ICD-10-CM

## 2020-03-12 NOTE — Telephone Encounter (Signed)
I called pt to make an appt for 78m f/u in person. [name] (pt's husband) states that he does not want to make an appt right now because it may be too hard to get pt out of house. I offered a VV and he said he will schedule at a later date. He expressed concern about [facility] not doing physical therapy with pt. He states that they have came and talked to them and assessed pt but have not been "hands on". He states that [facility] told him that there is only two appts left in home with pt. He would like to request a different agency for Physical therapy. Please advise.

## 2020-03-12 NOTE — Telephone Encounter (Signed)
pt husband called and said that Horris Latino from [facility] told them that Dr. Olivia Mackie would need to get a Nurse assigned to her care. Also pt has an infection on her toe, they are going to call Podiatry to schedule an appt or call us back to schedule    Jerrye Bushy [phone]

## 2020-03-12 NOTE — Telephone Encounter (Signed)
Please advise 

## 2020-03-12 NOTE — Progress Notes (Signed)
[facility] Pall[facility] Consult Note Telephone: [phone]  Fax: [phone]  PATIENT NAME: [name] DOB: 01/03/1951 MRN: [id number]  PRIMARY CARE PROVIDER:   [name], [name], MD  REFERRING PROVIDER:  [name], [name], MD [place],  [place] [address]  RESPONSIBLE PARTY:   Self and husband, [name]: [phone]  C: [phone]  Chief complaint:  Initial palliative visit/debility    RECOMMENDATIONS and PLAN:  1.  Advanced care planning.  Patient is full code.  States that she would like to have CPR performed if needed.  Has living wills from years ago but is in process of having these changed.  Does state that she wants to change her HCPOA.  Have encouraged to make changes if needed sooner than later and to make sure providers have copies of her wishes.  Spent more than 16 minutes discussing ACP  2.  Left sided weakness s/p CVA.  Patient with recent multiple CVAs with resultant weakness to left side and weakness with standing.  Has been having recurrent falls. Currently is receiving home health PT/OT but states that they do not come in often.  Have encouraged working on exercises left with her daily and to slowly increase how much time she spends each day on them.    3.  Left arm pain.  voltaren gel gives pain relief which helps her to move her arm more.  Continue using voltaren gel especially before therapy sessions.  4. Depression.  Patient having increased depression due to loss of independence.  Decreased appetite may be related to her depression.  States that she does not like living like this.  Discussed her limitations due to CVAs and ways to help.  Have encouraged getting help in the home especially in the mornings when she feels the weakest.  Will continue to assess for depression and may recommend adjusting her depression medications if needed for prolonged complicated grieving.  Palliative will  continue to monitor for symptom management/decline and make recommendations as needed.  Follow up in one week.  Encouraged to call with any questions or concerns.  HISTORY OF PRESENT ILLNESS:  [name] is a 69 y.o. year old adult with multiple medical problems including DMT2, HTN GERD, HLD, a-fib, left sided weakness s/p CVA, recurrent falls, depression, sleep apnea uses CPAP, venous stasis. Palliative Care was asked to help address goals of care. Have reviewed chart, latest labs and radiologic imaging.  Patient lives at home with her husband.  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  Patient has been having limitations to activities over the past 3 years related to a staph infection she had gotten her left heel.  Though she was still independent for ADLs and IADLs. This has healed but have been told that her left heel will always be tender.  She had CVA 12/21/19 and went to SNF for short term rehab after hospitalization.  She states that she was doing well after coming home and was able to walk with walker.  She had another 2 CVAs mid November and is receiving home health services.   States that some days she is able to assist her husband to stand and get to bedside commode.  She requires assistance with ADLs and IADLs. Since the CVAs about a month ago she has been having pain in feet that go from her toes to heels.  Has this pain mostly when she tries to stand and that it does  make her feel too weak to stand.  Does need a hoyer lift for transfers at times. Still feels weak and tired; more so in the mornings. Has pain in left shoulder related to her CVA and gets relief with voltaren gel which she applies TID.  Was having pain in left shoulder after CVAs that made it difficult to abduct her arm.  Is able to move more freely when voltaren gel applied.  Though still limited ROM. Her appetite is fair and she has lost 34 pounds.  12/19/19 weighed 254 pounds with BMI of 42.27.  Now weighs 220 pounds  with BMI of 36.61. Patient has history of depression and is having depression related to her loss of independence.  Rest of 10 point ROS asked and negative.    CODE STATUS: see above  PPS: 30% HOSPICE ELIGIBILITY/DIAGNOSIS: TBD  PHYSICAL EXAM:  BP 108/56  HR 71  O2 93% on RA General: NAD, frail appearing Eyes: sclera anicteric and noninjected with no discharge noted Cardiovascular: regular rate and rhythm Pulmonary: lung sounds clear; normal respiratory effort Abdomen: soft, nontender, + bowel sounds Extremities: no edema, no joint deformities Skin: no rashes on exposed skin; venous stasis changes noted to bilateral lower legs Neurological: Weakness but otherwise nonfocal  Family History  Problem Relation Age of Onset  . Skin cancer Father   . Diabetes Father   . Hypertension Father   . Peripheral vascular disease Father   . Cancer Father   . Cerebral aneurysm Father   . Alcohol abuse Father   . Varicose Veins Mother   . Kidney disease Mother   . Arthritis Mother   . Mental illness Sister   . Cancer Maternal Aunt        breast  . Breast cancer Maternal Aunt   . Arthritis Maternal Grandmother   . Hypertension Maternal Grandmother   . Diabetes Maternal Grandmother   . Arthritis Maternal Grandfather   . Heart disease Maternal Grandfather   . Hypertension Maternal Grandfather   . Arthritis Paternal Grandmother   . Hypertension Paternal Grandmother   . Diabetes Paternal Grandmother   . Arthritis Paternal Grandfather   . Hypertension Paternal Grandfather   . Bladder Cancer Neg Hx   . Kidney cancer Neg Hx       PAST MEDICAL HISTORY:  Past Medical History:  Diagnosis Date  . Abnormal antibody titer   . Anxiety and depression   . Arthritis   . Asthma   . Cystocele   . Depression   . Diabetes ([place])   . DVT (deep venous thrombosis) (HCC)    Righ calf  . Dyspnea    11/08/2018 - "more now due to lack of exercise"  . Dysrhythmia    afib   . Edema   . GERD (gastroesophageal reflux disease)   . Heart murmur    Echocardiogram June 2015: Mild MR (possible vegetation seen on TTE, not seen on TEE), normal LV size with moderate concentric LVH. Normal function EF 60-65%. Normal diastolic function. Mild LA dilation.  Marland Kitchen History of IBS   . History of kidney stones   . History of methicillin resistant staphylococcus aureus (MRSA) 2008  . Hyperlipidemia   . Hypertension   . Hypothyroidism   . Insomnia   . Kidney stone    kidney stones with lithotripsy .  [facility]- "adrenal glands"  . Neuropathy involving both lower extremities   . Obesity, Class II, BMI 35-39.9, with comorbidity   . Paroxysmal atrial fibrillation (HCC)  2007   In June 2015, Cardiac Event Monitor: Mostly SR/sinus arrhythmia with PVCs that are frequent. Short bursts of A. fib lasting several minutes;; CHA2DS2-VASc Score = 5 (age, Female, PVD, DM, HTN)  . Peripheral vascular disease (HCC)   . Sleep apnea    use C-PAP  . Urinary incontinence   . Venous stasis dermatitis of both lower extremities     SOCIAL HX:  Social History   Tobacco Use  . Smoking status: Never Smoker  . Smokeless tobacco: Never Used  Substance Use Topics  . Alcohol use: No    Alcohol/week: 0.0 standard drinks    ALLERGIES:  Allergies  Allergen Reactions  . Morphine And Related Other (See Comments) and Nausea Only    Patient becomes very confused Other reaction(s): Other (See Comments), Other (See Comments) Patient becomes very confused Patient becomes very confused   . Penicillins Hives, Shortness Of Breath, Swelling and Other (See Comments)    Facial swelling Has patient had a PCN reaction causing immediate rash, facial/tongue/throat swelling, SOB or lightheadedness with hypotension: Yes Has patient had a PCN reaction causing severe rash involving mucus membranes or skin necrosis: Yes Has patient had a PCN reaction that required hospitalization No Has patient had a PCN  reaction occurring within the last 10 years: No If all of the above answers are "NO", then may proceed with Cephalosporin use.  Other reaction(s): Other (See Comments), Other (See Comments) Facial swelling Has patient had a PCN reaction causing immediate rash, facial/tongue/throat swelling, SOB or lightheadedness with hypotension: Yes Has patient had a PCN reaction causing severe rash involving mucus membranes or skin necrosis: Yes Has patient had a PCN reaction that required hospitalization No Has patient had a PCN reaction occurring within the last 10 years: No If all of the above answers are "NO", then may proceed with Cephalosporin use. Facial swelling Has patient had a PCN reaction causing immediate rash, facial/tongue/throat swelling, SOB or lightheadedness with hypotension: Yes Has patient had a PCN reaction causing severe rash involving mucus membranes or skin necrosis: Yes Has patient had a PCN reaction that required hospitalization No Has patient had a PCN reaction occurring within the last 10 years: No If all of the above answers are "NO", then may proceed with Cephalosporin use. Facial swelling Has patient had a PCN reaction causing immediate rash, facial/tongue/throat swelling, SOB or lightheadedness with hypotension: Yes Has patient had a PCN reaction causing severe rash involving mucus membranes or skin necrosis: Yes Has patient had a PCN reaction that required hospitalization No Has patient had a PCN reaction occurring within the last 10 years: No If all of the above answers are "NO", then may proceed with Cephalosporin use.   . Ambien [Zolpidem]     Hallucination   . Aspirin Hives  . Oxytetracycline Hives    Other reaction(s): Unknown      PERTINENT MEDICATIONS:  Outpatient Encounter Medications as of 03/12/2020  Medication Sig  . acetaminophen (TYLENOL) 325 MG tablet Take 2 tablets (650 mg total) by mouth every 6 (six) hours as needed for mild pain or moderate pain  (or Fever >/= 101).  Marland Kitchen albuterol (PROVENTIL) (2.5 MG/3ML) 0.083% nebulizer solution Take 2.5 mg by nebulization every 4 (four) hours as needed for wheezing or shortness of breath.  . ALPRAZolam (XANAX) 0.5 MG tablet Take 0.5 mg by mouth at bedtime as needed for anxiety or sleep.  Marland Kitchen amLODipine (NORVASC) 5 MG tablet Take 1 tablet (5 mg total) by mouth daily.  Marland Kitchen  apixaban (ELIQUIS) 5 MG TABS tablet Take 1 tablet (5 mg total) by mouth 2 (two) times daily.  Marland Kitchen buPROPion (WELLBUTRIN XL) 150 MG 24 hr tablet Take 1 tablet (150 mg total) by mouth daily.  . calcium-vitamin D (OSCAL WITH D) 500-200 MG-UNIT tablet Take 1 tablet by mouth 2 (two) times daily with a meal.  . cholecalciferol (VITAMIN D) 25 MCG tablet Take 1 tablet (1,000 Units total) by mouth 2 (two) times daily with a meal.  . clobetasol cream (TEMOVATE) 0.05 % Apply 1 application topically 2 (two) times daily as needed (skin irritation).   . colchicine 0.6 MG tablet Take 0.6 mg by mouth 2 (two) times daily as needed (acute gout flare).   . diclofenac Sodium (VOLTAREN) 1 % GEL Apply 2 g topically 3 (three) times daily.  . DULoxetine (CYMBALTA) 60 MG capsule Take 1 capsule (60 mg total) by mouth daily.  . ergocalciferol (VITAMIN D2) 1.25 MG (50000 UT) capsule Take 50,000 Units by mouth once a week. (Patient not taking: Reported on 03/01/2020)  . estradiol (ESTRACE) 0.1 MG/GM vaginal cream Place 1 Applicatorful vaginally every Monday, Wednesday, and Friday. Apply 0.5mg  (pea-sized amount)  just inside the vaginal introitus with a finger-tip on Monday, Wednesday and Friday nights,  . fluticasone-salmeterol (ADVAIR HFA) 115-21 MCG/ACT inhaler Inhale 2 puffs into the lungs daily. Rinse mouth thoroughly after use  . folic acid (FOLVITE) 1 MG tablet Take 1 tablet (1 mg total) by mouth daily.  Marland Kitchen HYDROcodone-acetaminophen (NORCO/VICODIN) 5-325 MG tablet Take 1 tablet by mouth every 8 (eight) hours as needed for severe pain. Must last 30 days  . [START ON  03/20/2020] HYDROcodone-acetaminophen (NORCO/VICODIN) 5-325 MG tablet Take 1 tablet by mouth every 8 (eight) hours as needed for severe pain. Must last 30 days  . [START ON 04/19/2020] HYDROcodone-acetaminophen (NORCO/VICODIN) 5-325 MG tablet Take 1 tablet by mouth every 8 (eight) hours as needed for severe pain. Must last 30 days  . levocetirizine (XYZAL) 5 MG tablet Take 0.5 tablets (2.5 mg total) by mouth every evening. (Patient taking differently: Take 5 mg by mouth at bedtime as needed for allergies.)  . levothyroxine (SYNTHROID) 175 MCG tablet Take 1 tablet (175 mcg total) by mouth daily before breakfast.  . losartan-hydrochlorothiazide (HYZAAR) 50-12.5 MG tablet Take 1 tablet by mouth daily.  . metFORMIN (GLUCOPHAGE) 500 MG tablet Take 1 tablet (500 mg total) by mouth daily with breakfast. With food  . metoprolol succinate (TOPROL-XL) 50 MG 24 hr tablet TAKE 1 TABLET BY MOUTH DAILY (Patient taking differently: Take 50 mg by mouth daily.)  . Misc. Devices (TUB TRANSFER BOARD) MISC 1 Device by Does not apply route as needed.  . montelukast (SINGULAIR) 10 MG tablet Take 1 tablet (10 mg total) by mouth at bedtime.  . Multiple Vitamin (MULTIVITAMIN WITH MINERALS) TABS tablet Take 1 tablet by mouth daily.  . ondansetron (ZOFRAN) 4 MG tablet Take 1 tablet (4 mg total) by mouth every 8 (eight) hours as needed for nausea or vomiting. (Patient not taking: Reported on 03/01/2020)  . oxybutynin (DITROPAN-XL) 10 MG 24 hr tablet Take 10 mg by mouth at bedtime.  . pantoprazole (PROTONIX) 40 MG tablet Take 40 mg by mouth daily.   . pregabalin (LYRICA) 50 MG capsule Take 1 capsule (50 mg total) by mouth 3 (three) times daily.  . rosuvastatin (CRESTOR) 20 MG tablet Take 1 tablet (20 mg total) by mouth daily.  . sitaGLIPtin (JANUVIA) 25 MG tablet Take 1 tablet (25 mg total) by  mouth daily.  . sucralfate (CARAFATE) 1 GM/10ML suspension Take 1 g by mouth 3 (three) times daily.   Marland Kitchen thiamine 100 MG tablet Take 0.5  tablets (50 mg total) by mouth 2 (two) times daily with a meal.  . TOVIAZ 8 MG TB24 tablet TAKE ONE TABLET BY MOUTH EVERY DAY (Patient taking differently: Take 8 mg by mouth daily.)  . trospium (SANCTURA) 20 MG tablet Take 20 mg by mouth 2 (two) times daily.  . vitamin B-12 1000 MCG tablet Take 1 tablet (1,000 mcg total) by mouth daily.   Facility-Administered Encounter Medications as of 03/12/2020  Medication  . ipratropium-albuterol (DUONEB) 0.5-2.5 (3) MG/3ML nebulizer solution 3 mL     Marqus Macphee Marlena Clipper, NP

## 2020-03-13 NOTE — Telephone Encounter (Signed)
Called and spoke with the Patient states that she is needing an order placed for an electric hoyer lift. She currently has a manual one that her son and husband can not use.    Does Patient need an in person visit to address foot, lift, and home health? May be able to get Patient transportation through a [facility] program

## 2020-03-13 NOTE — Telephone Encounter (Signed)
This patient is homebound and I am unable to do face to face visit since hospital discharge for H/H orders and DME needs   H/o recurrent stroke and bedbound  -She needs PT/OT/SW/RN/aid per husband [facility] has missed PT/OT sessions  -She needs electric hoyer lift harness and sling  [facility] and any other DME home needs   Due to I cant get her in the office for visit due to mobility can SW help assist with home needs she probably should have been discharged to [facility] again   [place] already since a staff message to you both re this patient as well   Thank you

## 2020-03-13 NOTE — Telephone Encounter (Signed)
Pt's husband called back asking about getting the harness and lift for patient from [facility]. He wants to know the status of [place] sending an order to them?

## 2020-03-14 ENCOUNTER — Telehealth: Payer: Self-pay

## 2020-03-14 NOTE — Telephone Encounter (Signed)
Received a message from patient requesting a return call. Patient shared that her 2nd toe on her left foot is red and pain increases with movement.Patient reached out to podiatrist but they are unable to see patient until January. Patient is unable to bear any weight for about 3 days due to pain. Daughter in law noted the redness and swelling on Wednesday. Son and daughter in law will be visiting until the following Tuesday. Patient continues to be followed by [facility] and had a nurse visit on 03/12/20

## 2020-03-18 ENCOUNTER — Telehealth: Payer: Self-pay | Admitting: Internal Medicine

## 2020-03-18 NOTE — Telephone Encounter (Signed)
Patient's husband calling in about paperwork that he dropped of for her last week. States this was a form for care through the [place] and that there were sticky notes all over it. He states he gave this to Clydie Braun and she told him she would get this to Dr French Ana [name].   DO you have any new paperwork on this Patient?  Please advise

## 2020-03-19 NOTE — Telephone Encounter (Signed)
Yes I have this paperwork will call when ready

## 2020-03-19 NOTE — Telephone Encounter (Signed)
Did we get automatic hoyer lift Rx to [facility]. She has podiatry for her foot to follow up and call and schedule appt asap and they will need to put in wound care orders for home health not PCP b/c H/H RN would need exact insturctions on care   We can try [place] transportation  -Olegario Messier can you help?   Call and ok Dellis Anes (see # in 1st message) h/h RN but also advise hospital d/c 01/2020 pt should have had aid, RN, PT/OT and SW per hospital d/c order

## 2020-03-19 NOTE — Telephone Encounter (Signed)
Did you get this order to patient or [facility] we did this last week  Thanks TMS

## 2020-03-19 NOTE — Telephone Encounter (Signed)
Patient 's husband called about paperwork from [place]

## 2020-03-20 ENCOUNTER — Telehealth: Payer: Self-pay

## 2020-03-20 NOTE — Telephone Encounter (Signed)
Faxed signed review of physician's order for OT eval/re-certification to [phone] on 03/20/20

## 2020-03-21 ENCOUNTER — Other Ambulatory Visit: Payer: Self-pay

## 2020-03-21 ENCOUNTER — Telehealth: Payer: Self-pay | Admitting: Internal Medicine

## 2020-03-21 ENCOUNTER — Telehealth: Payer: Self-pay | Admitting: Adult Health Nurse Practitioner

## 2020-03-21 ENCOUNTER — Other Ambulatory Visit: Payer: PPO | Admitting: Adult Health Nurse Practitioner

## 2020-03-21 ENCOUNTER — Encounter: Payer: Self-pay | Admitting: Internal Medicine

## 2020-03-21 DIAGNOSIS — Z515 Encounter for palliative care: Secondary | ICD-10-CM | POA: Diagnosis not present

## 2020-03-21 DIAGNOSIS — G8194 Hemiplegia, unspecified affecting left nondominant side: Secondary | ICD-10-CM | POA: Diagnosis not present

## 2020-03-21 NOTE — Telephone Encounter (Signed)
See more recent telephone encounter.

## 2020-03-21 NOTE — Telephone Encounter (Signed)
Rudi Coco from [facility] home health called about patient's right ankle and toe redness contact patient 's husband at [phone]

## 2020-03-21 NOTE — Telephone Encounter (Signed)
Patient's husband called in. Informed that paperwork is ready. Placed up front and copy sent to scan.   Informed of Patient needing in person podiatry appointment. Gave number for [place] transportation services. Podiatry is at [facility]. He will call to see if they will still transport there as it is connected to a [place] facility.   Patient's husband states the Patient is having symptoms of blood clot and UTI. Informed him that the blood clot is urgent and she will need to go to the ED to be evaluated and have imaging done. Informed that while there they can test and treat for a UTI.   Patient's husband verbalized understanding

## 2020-03-21 NOTE — Telephone Encounter (Signed)
Left sided weakness s/p CVA. Patient with recent multiple CVAs with resultant weakness to left side and weakness. Husband states that he was told through [facility] that she would need an order for the electric hoyer and he has reached out to his PCP for order or electric hoyer lift.    Per palliative care note -does husband have hoyer lift?

## 2020-03-21 NOTE — Telephone Encounter (Signed)
[name], Dawn Spillers, MD  You 23 minutes ago (1:24 PM)   TM    Working on paperwork will be ready and in box by end of today inform husband    Also inform him and h/h    -he needs to call podiatry and vascular about her foot, ortho about shoulder pain if any and neurology to schedule in person f/u no one can manage [name] from home unfortunately    Also we faxed Rx electric hoyer and sling to [facility] inform him    Thanks TMS

## 2020-03-21 NOTE — Telephone Encounter (Signed)
Working on paperwork will be ready and in box by end of today inform husband   Also inform him and h/h   -he needs to call podiatry and vascular about her foot, ortho about shoulder pain if any and neurology to schedule in person f/u no one can manage [name] from home unfortunately   Also we faxed Rx electric hoyer and sling to [facility] inform him   Thanks TMS

## 2020-03-21 NOTE — Telephone Encounter (Addendum)
No answer, no voicemail.  Paperwork is complete and ready to pick up.  Husband will need to call and make in person appointments for Podiatry, Neurology, Vascular, and Ortho.  If these are [place] facilities their offices can call [phone]) to get her set up transportation.

## 2020-03-21 NOTE — Telephone Encounter (Signed)
Grier Mitts routed conversation to [place] 2 days ago   Grier Mitts 2 days ago   IJ    Patient 's husband called about paperwork from [place]      Documentation     [name], Pasty Spillers, MD  You 2 days ago   TM    Yes I have this paperwork will call when ready

## 2020-03-21 NOTE — Telephone Encounter (Signed)
See previous phone encounter

## 2020-03-21 NOTE — Telephone Encounter (Signed)
[name] 1 hour ago (11:57 AM)   Cyril Mourning from [facility] home health called about patient's right ankle and toe redness contact patient 's husband at [phone]

## 2020-03-21 NOTE — Telephone Encounter (Signed)
Had received call from [facility], therapist with [facility].  Discussed that patient was not doing well and that she is at her max with therapy.  Husband had talked about starting hospice services.  Called husband and he did state that he would like to have her evaluated for hospice.  Patient has had significant functional decline since having her strokes over the past year and is having weight loss.  Have reached out to PCP via epic email with request for hospice referral.   Kunaal Walkins K. Garner Nash NP

## 2020-03-21 NOTE — Telephone Encounter (Signed)
For your information  

## 2020-03-21 NOTE — Telephone Encounter (Signed)
[name], Dawn Spillers, MD routed conversation to You 3 hours ago (10:05 AM)   [name], Dawn Spillers, MD 3 hours ago (10:05 AM)   TM     Left sided weakness s/p CVA.  Patient with recent multiple CVAs with resultant weakness to left side and weakness. Husband states that he was told through [employer] [facility] that she would need an order for the electric hoyer and he has reached out to his PCP for order or electric hoyer lift.     Per palliative care note -does husband have hoyer lift?

## 2020-03-21 NOTE — Progress Notes (Signed)
Therapist, nutritional Palliative Care Consult Note Telephone: [phone]  Fax: [phone]  PATIENT NAME: [name] DOB: 01-15-1951 MRN: [id number]  PRIMARY CARE PROVIDER:   [name], Pasty Spillers, MD  REFERRING PROVIDER:  [name], Pasty Spillers, MD [address] [place],  [place] [address]  RESPONSIBLE PARTY:  Self and husband, Newt Lukes: [phone]  C: [phone]  Due to the COVID-19 crisis, this visit was done via telemedicine and it was initiated and consent by this patient and or family. Video-audio (telehealth) contact was unable to be done due to technical barriers from the patient's side.  Chief complaint:  follow up palliative visit/debility    RECOMMENDATIONS and PLAN:  1.  Advanced care planning.  Patient is full code.   2.   Left sided weakness s/p CVA.  Patient with recent multiple CVAs with resultant weakness to left side and weakness. Husband states that he was told through [employer] [facility] that she would need an order for the electric hoyer and he has reached out to his PCP for order or electric hoyer lift.    3. Support.  Currently is receiving home health PT/OT/aide but states that they do not come in often.  Have encouraged getting in home help through an agency or contacting his church family.  Discussed reaching out to his insurance company to see if they will pay for any in home help and if they have any agencies they use.   Palliative will continue to monitor for symptom management/decline and make recommendations as needed.  Follow up in 3 weeks.  Encouraged to call with any questions or concerns.  I spent 40 minutes providing this consultation. More than 50% of the time in this consultation was spent coordinating communication.   HISTORY OF PRESENT ILLNESS:  [name] is a 69 y.o. year old adult with multiple medical problems including DMT2, HTN GERD, HLD, a-fib, left sided weakness s/p CVA, recurrent falls,  depression, sleep apnea uses CPAP, venous stasis. Palliative Care was asked to help address goals of care. Patient is having pain in left foot which is making it difficult for walk but she is able to stand with a walker on it.  Husband states that she home health care is coming in with aide, PT, and OT services but does not feel like they stay very long to give the patient the care she needs.  Did state that the aide called out sick today so he does not have help for today.   Husband would like electric hoyer lift.  Has manual one and this "skims the top of the hospital bed."  Patient would also like to be able to get in regular bed with her husband and unable to do that with the manual lift.  Rest of 10 point ROS asked and negative  CODE STATUS: full code  PPS: 30% HOSPICE ELIGIBILITY/DIAGNOSIS: TBD  PHYSICAL EXAM:   Deferred  PAST MEDICAL HISTORY:  Past Medical History:  Diagnosis Date  . Abnormal antibody titer   . Anxiety and depression   . Arthritis   . Asthma   . Cystocele   . Depression   . Diabetes (HCC)   . DVT (deep venous thrombosis) (HCC)    Righ calf  . Dyspnea    11/08/2018 - "more now due to lack of exercise"  . Dysrhythmia    afib  . Edema   . GERD (gastroesophageal reflux disease)   . Heart murmur    Echocardiogram  June 2015: Mild MR (possible vegetation seen on TTE, not seen on TEE), normal LV size with moderate concentric LVH. Normal function EF 60-65%. Normal diastolic function. Mild LA dilation.  Marland Kitchen History of IBS   . History of kidney stones   . History of methicillin resistant staphylococcus aureus (MRSA) 2008  . Hyperlipidemia   . Hypertension   . Hypothyroidism   . Insomnia   . Kidney stone    kidney stones with lithotripsy .  [facility]- "adrenal glands"  . Neuropathy involving both lower extremities   . Obesity, Class II, BMI 35-39.9, with comorbidity   . Paroxysmal atrial fibrillation ([place]) 2007   In June 2015, Cardiac Event Monitor: Mostly  SR/sinus arrhythmia with PVCs that are frequent. Short bursts of A. fib lasting several minutes;; CHA2DS2-VASc Score = 5 (age, Female, PVD, DM, HTN)  . Peripheral vascular disease ([place])   . Sleep apnea    use C-PAP  . Urinary incontinence   . Venous stasis dermatitis of both lower extremities     SOCIAL HX:  Social History   Tobacco Use  . Smoking status: Never Smoker  . Smokeless tobacco: Never Used  Substance Use Topics  . Alcohol use: No    Alcohol/week: 0.0 standard drinks    ALLERGIES:  Allergies  Allergen Reactions  . Morphine And Related Other (See Comments) and Nausea Only    Patient becomes very confused Other reaction(s): Other (See Comments), Other (See Comments) Patient becomes very confused Patient becomes very confused   . Penicillins Hives, Shortness Of Breath, Swelling and Other (See Comments)    Facial swelling Has patient had a PCN reaction causing immediate rash, facial/tongue/throat swelling, SOB or lightheadedness with hypotension: Yes Has patient had a PCN reaction causing severe rash involving mucus membranes or skin necrosis: Yes Has patient had a PCN reaction that required hospitalization No Has patient had a PCN reaction occurring within the last 10 years: No If all of the above answers are "NO", then may proceed with Cephalosporin use.  Other reaction(s): Other (See Comments), Other (See Comments) Facial swelling Has patient had a PCN reaction causing immediate rash, facial/tongue/throat swelling, SOB or lightheadedness with hypotension: Yes Has patient had a PCN reaction causing severe rash involving mucus membranes or skin necrosis: Yes Has patient had a PCN reaction that required hospitalization No Has patient had a PCN reaction occurring within the last 10 years: No If all of the above answers are "NO", then may proceed with Cephalosporin use. Facial swelling Has patient had a PCN reaction causing immediate rash, facial/tongue/throat swelling,  SOB or lightheadedness with hypotension: Yes Has patient had a PCN reaction causing severe rash involving mucus membranes or skin necrosis: Yes Has patient had a PCN reaction that required hospitalization No Has patient had a PCN reaction occurring within the last 10 years: No If all of the above answers are "NO", then may proceed with Cephalosporin use. Facial swelling Has patient had a PCN reaction causing immediate rash, facial/tongue/throat swelling, SOB or lightheadedness with hypotension: Yes Has patient had a PCN reaction causing severe rash involving mucus membranes or skin necrosis: Yes Has patient had a PCN reaction that required hospitalization No Has patient had a PCN reaction occurring within the last 10 years: No If all of the above answers are "NO", then may proceed with Cephalosporin use.   . Ambien [Zolpidem]     Hallucination   . Aspirin Hives  . Oxytetracycline Hives    Other reaction(s): Unknown  PERTINENT MEDICATIONS:  Outpatient Encounter Medications as of 03/21/2020  Medication Sig  . acetaminophen (TYLENOL) 325 MG tablet Take 2 tablets (650 mg total) by mouth every 6 (six) hours as needed for mild pain or moderate pain (or Fever >/= 101).  Marland Kitchen albuterol (PROVENTIL) (2.5 MG/3ML) 0.083% nebulizer solution Take 2.5 mg by nebulization every 4 (four) hours as needed for wheezing or shortness of breath.  . ALPRAZolam (XANAX) 0.5 MG tablet Take 0.5 mg by mouth at bedtime as needed for anxiety or sleep.  Marland Kitchen amLODipine (NORVASC) 5 MG tablet Take 1 tablet (5 mg total) by mouth daily.  Marland Kitchen apixaban (ELIQUIS) 5 MG TABS tablet Take 1 tablet (5 mg total) by mouth 2 (two) times daily.  Marland Kitchen buPROPion (WELLBUTRIN XL) 150 MG 24 hr tablet Take 1 tablet (150 mg total) by mouth daily.  . calcium-vitamin D (OSCAL WITH D) 500-200 MG-UNIT tablet Take 1 tablet by mouth 2 (two) times daily with a meal.  . cholecalciferol (VITAMIN D) 25 MCG tablet Take 1 tablet (1,000 Units total) by mouth 2  (two) times daily with a meal.  . clobetasol cream (TEMOVATE) 0.05 % Apply 1 application topically 2 (two) times daily as needed (skin irritation).   . colchicine 0.6 MG tablet Take 0.6 mg by mouth 2 (two) times daily as needed (acute gout flare).   . diclofenac Sodium (VOLTAREN) 1 % GEL Apply 2 g topically 3 (three) times daily.  . DULoxetine (CYMBALTA) 60 MG capsule Take 1 capsule (60 mg total) by mouth daily.  . ergocalciferol (VITAMIN D2) 1.25 MG (50000 UT) capsule Take 50,000 Units by mouth once a week. (Patient not taking: Reported on 03/01/2020)  . estradiol (ESTRACE) 0.1 MG/GM vaginal cream Place 1 Applicatorful vaginally every Monday, Wednesday, and Friday. Apply 0.5mg  (pea-sized amount)  just inside the vaginal introitus with a finger-tip on Monday, Wednesday and Friday nights,  . fluticasone-salmeterol (ADVAIR HFA) 115-21 MCG/ACT inhaler Inhale 2 puffs into the lungs daily. Rinse mouth thoroughly after use  . folic acid (FOLVITE) 1 MG tablet Take 1 tablet (1 mg total) by mouth daily.  Marland Kitchen HYDROcodone-acetaminophen (NORCO/VICODIN) 5-325 MG tablet Take 1 tablet by mouth every 8 (eight) hours as needed for severe pain. Must last 30 days  . HYDROcodone-acetaminophen (NORCO/VICODIN) 5-325 MG tablet Take 1 tablet by mouth every 8 (eight) hours as needed for severe pain. Must last 30 days  . [START ON 04/19/2020] HYDROcodone-acetaminophen (NORCO/VICODIN) 5-325 MG tablet Take 1 tablet by mouth every 8 (eight) hours as needed for severe pain. Must last 30 days  . levocetirizine (XYZAL) 5 MG tablet Take 0.5 tablets (2.5 mg total) by mouth every evening. (Patient taking differently: Take 5 mg by mouth at bedtime as needed for allergies.)  . levothyroxine (SYNTHROID) 175 MCG tablet Take 1 tablet (175 mcg total) by mouth daily before breakfast.  . losartan-hydrochlorothiazide (HYZAAR) 50-12.5 MG tablet Take 1 tablet by mouth daily.  . metFORMIN (GLUCOPHAGE) 500 MG tablet Take 1 tablet (500 mg total) by  mouth daily with breakfast. With food  . metoprolol succinate (TOPROL-XL) 50 MG 24 hr tablet TAKE 1 TABLET BY MOUTH DAILY (Patient taking differently: Take 50 mg by mouth daily.)  . Misc. Devices (TUB TRANSFER BOARD) MISC 1 Device by Does not apply route as needed.  . montelukast (SINGULAIR) 10 MG tablet Take 1 tablet (10 mg total) by mouth at bedtime.  . Multiple Vitamin (MULTIVITAMIN WITH MINERALS) TABS tablet Take 1 tablet by mouth daily.  . ondansetron (ZOFRAN) 4  MG tablet Take 1 tablet (4 mg total) by mouth every 8 (eight) hours as needed for nausea or vomiting. (Patient not taking: Reported on 03/01/2020)  . oxybutynin (DITROPAN-XL) 10 MG 24 hr tablet Take 10 mg by mouth at bedtime.  . pantoprazole (PROTONIX) 40 MG tablet Take 40 mg by mouth daily.   . pregabalin (LYRICA) 50 MG capsule Take 1 capsule (50 mg total) by mouth 3 (three) times daily.  . rosuvastatin (CRESTOR) 20 MG tablet Take 1 tablet (20 mg total) by mouth daily.  . sitaGLIPtin (JANUVIA) 25 MG tablet Take 1 tablet (25 mg total) by mouth daily.  . sucralfate (CARAFATE) 1 GM/10ML suspension Take 1 g by mouth 3 (three) times daily.   Marland Kitchen thiamine 100 MG tablet Take 0.5 tablets (50 mg total) by mouth 2 (two) times daily with a meal.  . TOVIAZ 8 MG TB24 tablet TAKE ONE TABLET BY MOUTH EVERY DAY (Patient taking differently: Take 8 mg by mouth daily.)  . trospium (SANCTURA) 20 MG tablet Take 20 mg by mouth 2 (two) times daily.  . vitamin B-12 1000 MCG tablet Take 1 tablet (1,000 mcg total) by mouth daily.   Facility-Administered Encounter Medications as of 03/21/2020  Medication  . ipratropium-albuterol (DUONEB) 0.5-2.5 (3) MG/3ML nebulizer solution 3 mL      Lyliana Dicenso Marlena Clipper, NP

## 2020-03-21 NOTE — Telephone Encounter (Signed)
See previous telephone encounter.

## 2020-03-21 NOTE — Telephone Encounter (Signed)
The written order had been placed on paper and faxed

## 2020-03-22 ENCOUNTER — Telehealth: Payer: Self-pay

## 2020-03-22 NOTE — Telephone Encounter (Signed)
Received message that Kendal Hymen PT from [facility] called regarding concerns expressed by patient's husband that patient may have a UTI and blood clot. Per Kendal Hymen, PCP will need to have doppler and urine culture done before appropriate treatment can be done. Kendal Hymen requesting that someone from Palliative care reach out to patient's husband. Per review of Angelique Holm NP report, patient's husband is interested in transitioning patient to hospice services. VM left for patient's husband to discuss his concerns and hospice vs palliative services. Message also sent to PCP.

## 2020-03-25 ENCOUNTER — Telehealth: Payer: Self-pay | Admitting: Internal Medicine

## 2020-03-25 ENCOUNTER — Telehealth: Payer: Self-pay

## 2020-03-25 NOTE — Telephone Encounter (Signed)
-----   Message from Caffie Damme, NP sent at 03/21/2020  5:00 PM EST ----- Dr. Judie Grieve I had a telephone visit with Ms. Wilhide this morning and we discussed options with getting help in the home.  Kendal Hymen with [facility] was in the home and she called me and has a different picture of what is going on and has suggested hospice and the husband would like to get a referral for hospice if you are in agreement with that. I am quarantining at this time and am unable to do a in person visit with them and they say the are not able to do a virtual visit. The referral fax number for our hospice is [phone]. Call me at [phone] with any questions. Thank you Angelique Holm NP

## 2020-03-25 NOTE — Telephone Encounter (Signed)
Phone call placed to patient's husband to follow up and check in on patient. VM with call back information left.

## 2020-03-25 NOTE — Telephone Encounter (Signed)
Pt called to follow up on forms to be filled out  And also they need a larger hoyer lift rx sent to [facility]

## 2020-03-25 NOTE — Addendum Note (Signed)
Addended by: Quentin Ore on: 03/25/2020 08:21 PM   Modules accepted: Orders

## 2020-03-25 NOTE — Telephone Encounter (Signed)
Referral placed please see fax # for referral thank you

## 2020-03-26 ENCOUNTER — Telehealth: Payer: Self-pay

## 2020-03-26 NOTE — Telephone Encounter (Signed)
Received a call from Palliative NP, who stated that she had received a verbal order for hospice eval from PCP. Hospice referral center made aware.

## 2020-03-26 NOTE — Telephone Encounter (Signed)
Ok! Received and faxed.

## 2020-03-26 NOTE — Telephone Encounter (Signed)
Called and spoke with Patient's husband. He was inquiring about buying a bed pan and the order for the hoyer lift.  Informed him that that order was faxed.  Gave him the number for [facility] in [place]. He will call and ask them about the order and about buying a bedpan

## 2020-03-28 ENCOUNTER — Telehealth: Payer: Self-pay

## 2020-03-28 NOTE — Telephone Encounter (Signed)
Please advise, do you write scripts for gel mattress toppers?

## 2020-03-28 NOTE — Telephone Encounter (Signed)
12:30PM: Palliative care SW outreached patients spouse, per request, to discuss private caregiver agencies and needs.   SW provided the following agencies: [facility], [facility] and [facility] [place].  Husband had no other concerns/question for SW at this time. Contact information provided.

## 2020-03-28 NOTE — Telephone Encounter (Signed)
Pt called and has questions about gel overlay on hospital bed mattress. He needs an auth from PCP to cover the cost. Please call him back to clarify

## 2020-03-29 ENCOUNTER — Encounter: Payer: Self-pay | Admitting: Internal Medicine

## 2020-03-29 DIAGNOSIS — E1140 Type 2 diabetes mellitus with diabetic neuropathy, unspecified: Secondary | ICD-10-CM | POA: Diagnosis not present

## 2020-03-29 DIAGNOSIS — I69354 Hemiplegia and hemiparesis following cerebral infarction affecting left non-dominant side: Secondary | ICD-10-CM | POA: Diagnosis not present

## 2020-03-29 DIAGNOSIS — N390 Urinary tract infection, site not specified: Secondary | ICD-10-CM | POA: Diagnosis not present

## 2020-03-29 DIAGNOSIS — J45909 Unspecified asthma, uncomplicated: Secondary | ICD-10-CM | POA: Diagnosis not present

## 2020-03-29 DIAGNOSIS — Z7401 Bed confinement status: Secondary | ICD-10-CM | POA: Insufficient documentation

## 2020-03-29 DIAGNOSIS — I480 Paroxysmal atrial fibrillation: Secondary | ICD-10-CM | POA: Diagnosis not present

## 2020-03-29 DIAGNOSIS — I10 Essential (primary) hypertension: Secondary | ICD-10-CM | POA: Diagnosis not present

## 2020-03-29 DIAGNOSIS — I69320 Aphasia following cerebral infarction: Secondary | ICD-10-CM | POA: Diagnosis not present

## 2020-03-29 DIAGNOSIS — E1151 Type 2 diabetes mellitus with diabetic peripheral angiopathy without gangrene: Secondary | ICD-10-CM | POA: Diagnosis not present

## 2020-03-29 DIAGNOSIS — G894 Chronic pain syndrome: Secondary | ICD-10-CM | POA: Diagnosis not present

## 2020-03-29 DIAGNOSIS — R69 Illness, unspecified: Secondary | ICD-10-CM | POA: Diagnosis not present

## 2020-03-29 NOTE — Telephone Encounter (Signed)
Rx in Box this is fine

## 2020-04-01 NOTE — Telephone Encounter (Signed)
Patient calling and states she does not qualify for hospice and was given palliative care. They will only come out once a month and patient is needing more help.   She is requesting home health to supplement her palliative care.   Was given information on a ride service through [facility]. Juliann Pulse what would I need to do to get this Patient set up to come in for a face to face so that we may place these orders?   Patient is not weight bearing on both feet. Husband is unable to help her up due to back problems.   Please advise

## 2020-04-01 NOTE — Telephone Encounter (Signed)
Left message to return call 

## 2020-04-01 NOTE — Telephone Encounter (Signed)
RX placed in envelope up front

## 2020-04-01 NOTE — Telephone Encounter (Signed)
Schedule the face to face visit and then call [facility] I am sure cannot assist in lifting a patient it would have to be EMS transport to and from office but would need to schedule appointment and then call EMS non emergency number at [phone] for [place] I do not know [place] but I am sure [place] would have that number, but  I would advise patient ito check insurance to see how much they will pay.

## 2020-04-02 NOTE — Telephone Encounter (Signed)
Spoke with the Patient and informed her of the below. She states that when the nurse came out to her home for the last time, she was able to get up to get in and out of the car.   Patient states she can get in and out of the car on her own. She will have her husband bring her to a face to face visit for home health. Scheduled for Thursday at 1:00 pm.

## 2020-04-02 NOTE — Telephone Encounter (Signed)
Pt's husband called and states that he was not going to call EMS to bring pt because it would be too much money. He would like a call back about other options.

## 2020-04-02 NOTE — Telephone Encounter (Signed)
I f the patient is unwilling to use EMS then we can call [facility] or [facility] but patient family would be responsible for getting her into a wheelchair for transport.

## 2020-04-04 ENCOUNTER — Ambulatory Visit (INDEPENDENT_AMBULATORY_CARE_PROVIDER_SITE_OTHER): Payer: Medicare HMO | Admitting: Internal Medicine

## 2020-04-04 ENCOUNTER — Encounter: Payer: Self-pay | Admitting: Internal Medicine

## 2020-04-04 ENCOUNTER — Telehealth: Payer: Self-pay | Admitting: Internal Medicine

## 2020-04-04 VITALS — BP 116/76 | HR 47 | Ht 65.0 in | Wt 220.0 lb

## 2020-04-04 DIAGNOSIS — E11621 Type 2 diabetes mellitus with foot ulcer: Secondary | ICD-10-CM

## 2020-04-04 DIAGNOSIS — Z7401 Bed confinement status: Secondary | ICD-10-CM

## 2020-04-04 DIAGNOSIS — E611 Iron deficiency: Secondary | ICD-10-CM | POA: Diagnosis not present

## 2020-04-04 DIAGNOSIS — M25511 Pain in right shoulder: Secondary | ICD-10-CM

## 2020-04-04 DIAGNOSIS — M19012 Primary osteoarthritis, left shoulder: Secondary | ICD-10-CM

## 2020-04-04 DIAGNOSIS — L97429 Non-pressure chronic ulcer of left heel and midfoot with unspecified severity: Secondary | ICD-10-CM

## 2020-04-04 DIAGNOSIS — R0902 Hypoxemia: Secondary | ICD-10-CM

## 2020-04-04 DIAGNOSIS — N390 Urinary tract infection, site not specified: Secondary | ICD-10-CM

## 2020-04-04 DIAGNOSIS — E538 Deficiency of other specified B group vitamins: Secondary | ICD-10-CM | POA: Diagnosis not present

## 2020-04-04 DIAGNOSIS — R5381 Other malaise: Secondary | ICD-10-CM

## 2020-04-04 DIAGNOSIS — R768 Other specified abnormal immunological findings in serum: Secondary | ICD-10-CM

## 2020-04-04 DIAGNOSIS — R5383 Other fatigue: Secondary | ICD-10-CM

## 2020-04-04 DIAGNOSIS — G8194 Hemiplegia, unspecified affecting left nondominant side: Secondary | ICD-10-CM

## 2020-04-04 DIAGNOSIS — L97422 Non-pressure chronic ulcer of left heel and midfoot with fat layer exposed: Secondary | ICD-10-CM

## 2020-04-04 DIAGNOSIS — R001 Bradycardia, unspecified: Secondary | ICD-10-CM

## 2020-04-04 DIAGNOSIS — I639 Cerebral infarction, unspecified: Secondary | ICD-10-CM | POA: Diagnosis not present

## 2020-04-04 DIAGNOSIS — I10 Essential (primary) hypertension: Secondary | ICD-10-CM

## 2020-04-04 DIAGNOSIS — R296 Repeated falls: Secondary | ICD-10-CM

## 2020-04-04 DIAGNOSIS — M8949 Other hypertrophic osteoarthropathy, multiple sites: Secondary | ICD-10-CM

## 2020-04-04 DIAGNOSIS — I4819 Other persistent atrial fibrillation: Secondary | ICD-10-CM | POA: Insufficient documentation

## 2020-04-04 DIAGNOSIS — M67912 Unspecified disorder of synovium and tendon, left shoulder: Secondary | ICD-10-CM

## 2020-04-04 DIAGNOSIS — M797 Fibromyalgia: Secondary | ICD-10-CM

## 2020-04-04 DIAGNOSIS — G5621 Lesion of ulnar nerve, right upper limb: Secondary | ICD-10-CM

## 2020-04-04 DIAGNOSIS — E1142 Type 2 diabetes mellitus with diabetic polyneuropathy: Secondary | ICD-10-CM

## 2020-04-04 DIAGNOSIS — I96 Gangrene, not elsewhere classified: Secondary | ICD-10-CM

## 2020-04-04 DIAGNOSIS — M159 Polyosteoarthritis, unspecified: Secondary | ICD-10-CM

## 2020-04-04 DIAGNOSIS — G8929 Other chronic pain: Secondary | ICD-10-CM

## 2020-04-04 DIAGNOSIS — M5412 Radiculopathy, cervical region: Secondary | ICD-10-CM

## 2020-04-04 DIAGNOSIS — I739 Peripheral vascular disease, unspecified: Secondary | ICD-10-CM

## 2020-04-04 DIAGNOSIS — M2550 Pain in unspecified joint: Secondary | ICD-10-CM | POA: Diagnosis not present

## 2020-04-04 DIAGNOSIS — E1351 Other specified diabetes mellitus with diabetic peripheral angiopathy without gangrene: Secondary | ICD-10-CM

## 2020-04-04 DIAGNOSIS — R17 Unspecified jaundice: Secondary | ICD-10-CM | POA: Diagnosis not present

## 2020-04-04 DIAGNOSIS — Z743 Need for continuous supervision: Secondary | ICD-10-CM | POA: Diagnosis not present

## 2020-04-04 DIAGNOSIS — M79602 Pain in left arm: Secondary | ICD-10-CM

## 2020-04-04 DIAGNOSIS — I482 Chronic atrial fibrillation, unspecified: Secondary | ICD-10-CM

## 2020-04-04 DIAGNOSIS — G5711 Meralgia paresthetica, right lower limb: Secondary | ICD-10-CM

## 2020-04-04 DIAGNOSIS — M7540 Impingement syndrome of unspecified shoulder: Secondary | ICD-10-CM

## 2020-04-04 NOTE — Progress Notes (Signed)
Chief Complaint  Patient presents with  . Referral   F/u EMS brought her from home as she is bedbound and physical weakness declined despite home PT/OT cant use walking device to ambulate and she is more bedbound which patient and husband [name] state they were not doing enough with her at home, physical condition has declined/strength and he is unable to take care of her or get her to appt and they had to transport via EMS today. They care agreeable to go back to [facility].  He is unable to help her transfer due to his back pain and she cant assist much. [facility] she needs to go there for PT/OT  Now she cant move lower legs on the right after 2 prior strokes have trouble moving left upper arm and left lower leg now unable to move right lower leg and having b/l leg pain  -she will need to f/u neurology  -f/u with cards she is bradycardic today 47 HR and Afib with recurrent stroke  Recurrent UTI wants to be tested rec she f/u with urology   B/l shoulder pain/left arm pain 5/10 needs to f/u with ortho   Chronic left heel ulcer per pt healing now has left toe appears gangrenous needs to f/u with podiatry  C/o b/l leg pain  -will need to f/u with vascular as well   Husband thinks her eyes are yellow tinged today   Persistent asthma did not use inhalers today with some wheezing rec use inhalers at home O2 92%     Review of Systems  Constitutional: Negative for weight loss.  HENT: Negative for hearing loss.   Respiratory: Positive for wheezing. Negative for shortness of breath.   Cardiovascular: Negative for chest pain.  Gastrointestinal: Negative for abdominal pain.  Genitourinary: Positive for dysuria.  Musculoskeletal: Positive for joint pain.  Skin: Negative for rash.  Neurological: Positive for sensory change and weakness.  Psychiatric/Behavioral: Positive for depression. The patient is nervous/anxious.    Past Medical History:  Diagnosis Date  . Abnormal  antibody titer   . Anxiety and depression   . Arthritis   . Asthma   . Cystocele   . Depression   . Diabetes ([place])   . DVT (deep venous thrombosis) (HCC)    Righ calf  . Dyspnea    11/08/2018 - "more now due to lack of exercise"  . Dysrhythmia    afib  . Edema   . GERD (gastroesophageal reflux disease)   . Heart murmur    Echocardiogram June 2015: Mild MR (possible vegetation seen on TTE, not seen on TEE), normal LV size with moderate concentric LVH. Normal function EF 60-65%. Normal diastolic function. Mild LA dilation.  Marland Kitchen History of IBS   . History of kidney stones   . History of methicillin resistant staphylococcus aureus (MRSA) 2008  . Hyperlipidemia   . Hypertension   . Hypothyroidism   . Insomnia   . Kidney stone    kidney stones with lithotripsy .  [facility]- "adrenal glands"  . Neuropathy involving both lower extremities   . Obesity, Class II, BMI 35-39.9, with comorbidity   . Paroxysmal atrial fibrillation ([place]) 2007   In June 2015, Cardiac Event Monitor: Mostly SR/sinus arrhythmia with PVCs that are frequent. Short bursts of A. fib lasting several minutes;; CHA2DS2-VASc Score = 5 (age, Female, PVD, DM, HTN)  . Peripheral vascular disease ([place])   . Sleep apnea    use C-PAP  . Urinary incontinence   .  Venous stasis dermatitis of both lower extremities    Past Surgical History:  Procedure Laterality Date  . ANKLE SURGERY Right   . APPLICATION OF WOUND VAC Left 06/27/2015   Procedure: APPLICATION OF WOUND VAC ( POSSIBLE ) ;  Surgeon: [name], MD;  Location: [facility] ORS;  Service: Vascular;  Laterality: Left;  . APPLICATION OF WOUND VAC Left 09/25/2016   Procedure: APPLICATION OF WOUND VAC;  Surgeon: [name], DPM;  Location: [facility];  Service: Podiatry;  Laterality: Left;  . arm surgery     right    . CHOLECYSTECTOMY    . COLONOSCOPY    . COLONOSCOPY WITH PROPOFOL N/A 01/11/2017   Procedure: COLONOSCOPY WITH PROPOFOL;  Surgeon: [name], MD;   Location: [facility] ENDOSCOPY;  Service: Endoscopy;  Laterality: N/A;  . COLONOSCOPY WITH PROPOFOL N/A 02/22/2017   Procedure: COLONOSCOPY WITH PROPOFOL;  Surgeon: [name], MD;  Location: [facility] ENDOSCOPY;  Service: Endoscopy;  Laterality: N/A;  . COLONOSCOPY WITH PROPOFOL N/A 05/31/2017   Procedure: COLONOSCOPY WITH PROPOFOL;  Surgeon: [name], MD;  Location: [facility] ENDOSCOPY;  Service: Endoscopy;  Laterality: N/A;  . ESOPHAGOGASTRODUODENOSCOPY (EGD) WITH PROPOFOL N/A 01/11/2017   Procedure: ESOPHAGOGASTRODUODENOSCOPY (EGD) WITH PROPOFOL;  Surgeon: [name], MD;  Location: [facility] ENDOSCOPY;  Service: Endoscopy;  Laterality: N/A;  . ESOPHAGOGASTRODUODENOSCOPY (EGD) WITH PROPOFOL N/A 10/25/2018   Procedure: ESOPHAGOGASTRODUODENOSCOPY (EGD) WITH PROPOFOL;  Surgeon: [name], MD;  Location: [facility] ENDOSCOPY;  Service: Endoscopy;  Laterality: N/A;  . ESOPHAGOGASTRODUODENOSCOPY (EGD) WITH PROPOFOL N/A 11/29/2019   Procedure: ESOPHAGOGASTRODUODENOSCOPY (EGD) WITH PROPOFOL;  Surgeon: Toledo, Benay Pike, MD;  Location: [facility] ENDOSCOPY;  Service: Gastroenterology;  Laterality: N/A;  . HERNIA REPAIR     umbilical  . HIATAL HERNIA REPAIR    . I & D EXTREMITY Left 06/27/2015   Procedure: IRRIGATION AND DEBRIDEMENT EXTREMITY            ( CALF HEMATOMA ) POSSIBLE WOUND VAC;  Surgeon: [name], MD;  Location: [facility] ORS;  Service: Vascular;  Laterality: Left;  . INCISION AND DRAINAGE OF WOUND Left 09/25/2016   Procedure: IRRIGATION AND DEBRIDEMENT - PARTIAL RESECTION OF ACHILLES TENDON WITH WOUND VAC APPLICATION;  Surgeon: [name], DPM;  Location: [facility] ORS;  Service: Podiatry;  Laterality: Left;  . INCISION AND DRAINAGE OF WOUND Left 11/09/2018   Procedure: Excision of left foot wound with ACell placement;  Surgeon: Wallace Going, DO;  Location: [facility];  Service: Plastics;  Laterality: Left;  . IRRIGATION AND DEBRIDEMENT ABSCESS Left 09/07/2016   Procedure: IRRIGATION AND  DEBRIDEMENT ABSCESS LEFT HEEL;  Surgeon: [name], DPM;  Location: [facility];  Service: Podiatry;  Laterality: Left;  . JOINT REPLACEMENT  2013   left knee replacement  . KIDNEY SURGERY Right    kidney stones  . LITHOTRIPSY    . LOWER EXTREMITY ANGIOGRAPHY Left 04/04/2018   Procedure: LOWER EXTREMITY ANGIOGRAPHY;  Surgeon: [name], MD;  Location: [facility] CV LAB;  Service: Cardiovascular;  Laterality: Left;  . NM GATED MYOVIEW ([place] HX)  February 2017   Likely breast attenuation. LOW RISK study. Normal EF 55-60%.  . OTHER SURGICAL HISTORY     08/2016 or 09/2016 surgery on achilles tenso h/o staph infection heal. Dr. [name]  . removal of left hematoma Left    leg  . REVERSE SHOULDER ARTHROPLASTY Right 10/08/2015   Procedure: REVERSE SHOULDER ARTHROPLASTY;  Surgeon: [name], MD;  Location: [facility];  Service:  Orthopedics;  Laterality: Right;  . SLEEVE GASTROPLASTY    . TONSILLECTOMY    . TOTAL KNEE ARTHROPLASTY Left   . TOTAL SHOULDER REPLACEMENT Right   . TRANSESOPHAGEAL ECHOCARDIOGRAM  08/08/2013   Mild LVH, EF 60-65%. Moderate LA dilation and mild RA dilation. Mild MR with no evidence of stenosis and no evidence of endocarditis. A false chordae is noted.  . TRANSTHORACIC ECHOCARDIOGRAM  08/03/2013   Mild-Moderate concentric LVH, EF 60-65%. Normal diastolic function. Mild LA dilation. Mild MR with possible vegetation  - not confirmed on TEE   . ULNAR NERVE TRANSPOSITION Right 08/06/2016   Procedure: ULNAR NERVE DECOMPRESSION/TRANSPOSITION;  Surgeon: [name], MD;  Location: [facility];  Service: Orthopedics;  Laterality: Right;  . UPPER GI ENDOSCOPY    . VAGINAL HYSTERECTOMY     Family History  Problem Relation Age of Onset  . Skin cancer Father   . Diabetes Father   . Hypertension Father   . Peripheral vascular disease Father   . Cancer Father   . Cerebral aneurysm Father   . Alcohol abuse Father   . Varicose Veins Mother   . Kidney disease Mother   .  Arthritis Mother   . Mental illness Sister   . Cancer Maternal Aunt        breast  . Breast cancer Maternal Aunt   . Arthritis Maternal Grandmother   . Hypertension Maternal Grandmother   . Diabetes Maternal Grandmother   . Arthritis Maternal Grandfather   . Heart disease Maternal Grandfather   . Hypertension Maternal Grandfather   . Arthritis Paternal Grandmother   . Hypertension Paternal Grandmother   . Diabetes Paternal Grandmother   . Arthritis Paternal Grandfather   . Hypertension Paternal Grandfather   . Bladder Cancer Neg Hx   . Kidney cancer Neg Hx    Social History   Socioeconomic History  . Marital status: Married    Spouse name: Not on file  . Number of children: Not on file  . Years of education: Not on file  . Highest education level: Not on file  Occupational History  . Not on file  Tobacco Use  . Smoking status: Never Smoker  . Smokeless tobacco: Never Used  Vaping Use  . Vaping Use: Never used  Substance and Sexual Activity  . Alcohol use: No    Alcohol/week: 0.0 standard drinks  . Drug use: Never  . Sexual activity: Yes  Other Topics Concern  . Not on file  Social History Narrative   She is currently married -- for 30+ years. Does not work. Does not smoke or take alcohol. She never smoked. She exercises at least 3 days a week since before her gastric surgery.   Marital status reviewed in history of present illness.   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 acetaminophen (TYLENOL) 325 MG tablet Take 2 tablets (650 mg total) by mouth every 6 (six) hours as needed for mild pain or moderate pain  (or Fever >/= 101).  Marland Kitchen albuterol (PROVENTIL) (2.5 MG/3ML) 0.083% nebulizer solution Take 2.5 mg by  nebulization every 4 (four) hours as needed for wheezing or shortness of breath.  . ALPRAZolam (XANAX) 0.5 MG tablet Take 0.5 mg by mouth at bedtime as needed for anxiety or sleep.  Marland Kitchen apixaban (ELIQUIS) 5 MG TABS tablet Take 1 tablet (5 mg total) by mouth 2 (two) times daily.  Marland Kitchen buPROPion (WELLBUTRIN XL) 150 MG 24 hr tablet Take 1 tablet (150 mg total) by mouth daily.  . calcium-vitamin D (OSCAL WITH D) 500-200 MG-UNIT tablet Take 1 tablet by mouth 2 (two) times daily with a meal.  . cholecalciferol (VITAMIN D) 25 MCG tablet Take 1 tablet (1,000 Units total) by mouth 2 (two) times daily with a meal.  . clobetasol cream (TEMOVATE) 6.14 % Apply 1 application topically 2 (two) times daily as needed (skin irritation).   . colchicine 0.6 MG tablet Take 0.6 mg by mouth 2 (two) times daily as needed (acute gout flare).   . diclofenac Sodium (VOLTAREN) 1 % GEL Apply 2 g topically 3 (three) times daily.  . DULoxetine (CYMBALTA) 60 MG capsule Take 1 capsule (60 mg total) by mouth daily.  . ergocalciferol (VITAMIN D2) 1.25 MG (50000 UT) capsule Take 50,000 Units by mouth once a week.  . estradiol (ESTRACE) 0.1 MG/GM vaginal cream Place 1 Applicatorful vaginally every Monday, Wednesday, and Friday. Apply 0.88m (pea-sized amount)  just inside the vaginal introitus with a finger-tip on Monday, Wednesday and Friday nights,  . fluticasone-salmeterol (ADVAIR HFA) 115-21 MCG/ACT inhaler Inhale 2 puffs into the lungs daily. Rinse mouth thoroughly after use  . folic acid (FOLVITE) 1 MG tablet Take 1 tablet (1 mg total) by mouth daily.  .Marland KitchenHYDROcodone-acetaminophen (NORCO/VICODIN) 5-325 MG tablet Take 1 tablet by mouth every 8 (eight) hours as needed for severe pain. Must last 30 days  . [START ON 04/19/2020] HYDROcodone-acetaminophen (NORCO/VICODIN) 5-325 MG tablet Take 1 tablet by mouth every 8 (eight) hours as needed for  severe pain. Must last 30 days  . levocetirizine (XYZAL) 5 MG tablet Take 0.5 tablets (2.5 mg total) by mouth every evening. (Patient taking differently: Take 5 mg by mouth at bedtime as needed for allergies.)  . levothyroxine (SYNTHROID) 175 MCG tablet Take 1 tablet (175 mcg total) by mouth daily before breakfast.  . losartan-hydrochlorothiazide (HYZAAR) 50-12.5 MG tablet Take 1 tablet by mouth daily.  . metFORMIN (GLUCOPHAGE) 500 MG tablet Take 1 tablet (500 mg total) by mouth daily with breakfast. With food  . metoprolol succinate (TOPROL-XL) 50 MG 24 hr tablet TAKE 1 TABLET BY MOUTH DAILY (Patient taking differently: Take 50 mg by mouth daily.)  . Misc. Devices (TUB TRANSFER BOARD) MISC 1 Device by Does not apply route as needed.  . montelukast (SINGULAIR) 10 MG tablet Take 1 tablet (10 mg total) by mouth at bedtime.  . Multiple Vitamin (MULTIVITAMIN WITH MINERALS) TABS tablet Take 1 tablet by mouth daily.  . ondansetron (ZOFRAN) 4 MG tablet Take 1 tablet (4 mg total) by mouth every 8 (eight) hours as needed for nausea or vomiting.  .Marland Kitchenoxybutynin (DITROPAN-XL) 10 MG 24 hr tablet Take 10 mg by mouth at bedtime.  . pantoprazole (PROTONIX) 40 MG tablet Take 40 mg by mouth daily.   . pregabalin (LYRICA) 50 MG capsule Take 1 capsule (50 mg total) by mouth 3 (three) times daily.  . rosuvastatin (CRESTOR) 20 MG tablet Take 1 tablet (20 mg total) by mouth daily.  . sitaGLIPtin (JANUVIA) 25 MG tablet Take 1 tablet (25 mg total) by mouth daily.  . sucralfate (  CARAFATE) 1 GM/10ML suspension Take 1 g by mouth 3 (three) times daily.   Marland Kitchen thiamine 100 MG tablet Take 0.5 tablets (50 mg total) by mouth 2 (two) times daily with a meal.  . TOVIAZ 8 MG TB24 tablet TAKE ONE TABLET BY MOUTH EVERY DAY (Patient taking differently: Take 8 mg by mouth daily.)  . trospium (SANCTURA) 20 MG tablet Take 20 mg by mouth 2 (two) times daily.  . vitamin B-12 1000 MCG tablet Take 1 tablet (1,000 mcg total) by mouth daily.    Current Facility-Administered Medications for the 04/04/20 encounter (Office Visit) with [name], [name], MD  Medication  . ipratropium-albuterol (DUONEB) 0.5-2.5 (3) MG/3ML nebulizer solution 3 mL   Allergies  Allergen Reactions  . Morphine And Related Other (See Comments) and Nausea Only    Patient becomes very confused Other reaction(s): Other (See Comments), Other (See Comments) Patient becomes very confused Patient becomes very confused   . Penicillins Hives, Shortness Of Breath, Swelling and Other (See Comments)    Facial swelling Has patient had a PCN reaction causing immediate rash, facial/tongue/throat swelling, SOB or lightheadedness with hypotension: Yes Has patient had a PCN reaction causing severe rash involving mucus membranes or skin necrosis: Yes Has patient had a PCN reaction that required hospitalization No Has patient had a PCN reaction occurring within the last 10 years: No If all of the above answers are "NO", then may proceed with Cephalosporin use.  Other reaction(s): Other (See Comments), Other (See Comments) Facial swelling Has patient had a PCN reaction causing immediate rash, facial/tongue/throat swelling, SOB or lightheadedness with hypotension: Yes Has patient had a PCN reaction causing severe rash involving mucus membranes or skin necrosis: Yes Has patient had a PCN reaction that required hospitalization No Has patient had a PCN reaction occurring within the last 10 years: No If all of the above answers are "NO", then may proceed with Cephalosporin use. Facial swelling Has patient had a PCN reaction causing immediate rash, facial/tongue/throat swelling, SOB or lightheadedness with hypotension: Yes Has patient had a PCN reaction causing severe rash involving mucus membranes or skin necrosis: Yes Has patient had a PCN reaction that required hospitalization No Has patient had a PCN reaction occurring within the last 10 years: No If all of the  above answers are "NO", then may proceed with Cephalosporin use. Facial swelling Has patient had a PCN reaction causing immediate rash, facial/tongue/throat swelling, SOB or lightheadedness with hypotension: Yes Has patient had a PCN reaction causing severe rash involving mucus membranes or skin necrosis: Yes Has patient had a PCN reaction that required hospitalization No Has patient had a PCN reaction occurring within the last 10 years: No If all of the above answers are "NO", then may proceed with Cephalosporin use.   . Ambien [Zolpidem]     Hallucination   . Aspirin Hives  . Oxytetracycline Hives    Other reaction(s): Unknown    Recent Results (from the past 2160 hour(s))  Urinalysis, Complete w Microscopic     Status: Abnormal   Collection Time: 02/04/20  3:37 PM  Result Value Ref Range   Color, Urine YELLOW (A) YELLOW   APPearance CLOUDY (A) CLEAR   Specific Gravity, Urine 1.011 1.005 - 1.030   pH 7.0 5.0 - 8.0   Glucose, UA NEGATIVE NEGATIVE mg/dL   Hgb urine dipstick SMALL (A) NEGATIVE   Bilirubin Urine NEGATIVE NEGATIVE   Ketones, ur NEGATIVE NEGATIVE mg/dL   Protein, ur NEGATIVE NEGATIVE mg/dL   Nitrite  NEGATIVE NEGATIVE   Leukocytes,Ua LARGE (A) NEGATIVE   RBC / HPF 0-5 0 - 5 RBC/hpf   WBC, UA >50 (H) 0 - 5 WBC/hpf   Bacteria, UA FEW (A) NONE SEEN   Squamous Epithelial / LPF 0-5 0 - 5    Comment: Performed at [facility], [address], [place], [place] [address]  Basic metabolic panel     Status: Abnormal   Collection Time: 02/04/20  4:45 PM  Result Value Ref Range   Sodium 140 135 - 145 mmol/L   Potassium 4.4 3.5 - 5.1 mmol/L   Chloride 102 98 - 111 mmol/L   CO2 30 22 - 32 mmol/L   Glucose, Bld 143 (H) 70 - 99 mg/dL    Comment: Glucose reference range applies only to samples taken after fasting for at least 8 hours.   BUN 16 8 - 23 mg/dL   Creatinine, Ser 1.01 (H) 0.44 - 1.00 mg/dL   Calcium 8.9 8.9 - 10.3 mg/dL   GFR, Estimated 60 (L) >60 mL/min     Comment: (NOTE) Calculated using the CKD-EPI Creatinine Equation (2021)    Anion gap 8 5 - 15    Comment: Performed at [facility], [address] [place], [place] [address]  Troponin I (High Sensitivity)     Status: None   Collection Time: 02/04/20  4:45 PM  Result Value Ref Range   Troponin I (High Sensitivity) 12 <18 ng/L    Comment: (NOTE) Elevated high sensitivity troponin I (hsTnI) values and significant  changes across serial measurements may suggest ACS but many other  chronic and acute conditions are known to elevate hsTnI results.  Refer to the "Links" section for chest pain algorithms and additional  guidance. Performed at [facility], [address] [place], [place] [address]   CBC with Differential     Status: Abnormal   Collection Time: 02/04/20  4:45 PM  Result Value Ref Range   WBC 7.0 4.0 - 10.5 K/uL   RBC 4.04 3.87 - 5.11 MIL/uL   Hemoglobin 10.4 (L) 12.0 - 15.0 g/dL   HCT 33.2 (L) 36.0 - 46.0 %   MCV 82.2 80.0 - 100.0 fL   MCH 25.7 (L) 26.0 - 34.0 pg   MCHC 31.3 30.0 - 36.0 g/dL   RDW 17.2 (H) 11.5 - 15.5 %   Platelets 238 150 - 400 K/uL   nRBC 0.0 0.0 - 0.2 %   Neutrophils Relative % 75 %   Neutro Abs 5.3 1.7 - 7.7 K/uL   Lymphocytes Relative 11 %   Lymphs Abs 0.7 0.7 - 4.0 K/uL   Monocytes Relative 11 %   Monocytes Absolute 0.7 0.1 - 1.0 K/uL   Eosinophils Relative 2 %   Eosinophils Absolute 0.2 0.0 - 0.5 K/uL   Basophils Relative 1 %   Basophils Absolute 0.1 0.0 - 0.1 K/uL   Immature Granulocytes 0 %   Abs Immature Granulocytes 0.02 0.00 - 0.07 K/uL    Comment: Performed at [facility], [address], [place], [place] [address]  Urine culture     Status: Abnormal   Collection Time: 02/05/20  3:41 AM   Specimen: Urine, Random  Result Value Ref Range   Specimen Description      URINE, RANDOM Performed at [facility], [address] [place], [place] [address]    Special Requests       NONE Performed at [facility], [place],  [place], [place] [address]    Culture MULTIPLE SPECIES PRESENT, SUGGEST RECOLLECTION (A)    Report Status 02/06/2020 FINAL   Respiratory Panel by RT PCR (Flu A&B, Covid) - Nasopharyngeal Swab     Status: None   Collection Time: 02/05/20  3:41 AM   Specimen: Nasopharyngeal Swab  Result Value Ref Range   SARS Coronavirus 2 by RT PCR NEGATIVE NEGATIVE    Comment: (NOTE) SARS-CoV-2 target nucleic acids are NOT DETECTED.  The SARS-CoV-2 RNA is generally detectable in upper respiratoy specimens during the acute phase of infection. The lowest concentration of SARS-CoV-2 viral copies this assay can detect is 131 copies/mL. A negative result does not preclude SARS-Cov-2 infection and should not be used as the sole basis for treatment or other patient management decisions. A negative result may occur with  improper specimen collection/handling, submission of specimen other than nasopharyngeal swab, presence of viral mutation(s) within the areas targeted by this assay, and inadequate number of viral copies (<131 copies/mL). A negative result must be combined with clinical observations, patient history, and epidemiological information. The expected result is Negative.  Fact Sheet for Patients:  PinkCheek.be  Fact Sheet for Healthcare Providers:  GravelBags.it  This test is no t yet approved or cleared by the [place] FDA and  has been authorized for detection and/or diagnosis of SARS-CoV-2 by FDA under an Emergency Use Authorization (EUA). This EUA will remain  in effect (meaning this test can be used) for the duration of the COVID-19 declaration under Section 564(b)(1) of the Act, 21 U.S.C. section 360bbb-3(b)(1), unless the authorization is terminated or revoked sooner.     Influenza A by PCR NEGATIVE NEGATIVE   Influenza B by PCR NEGATIVE NEGATIVE    Comment:  (NOTE) The Xpert Xpress SARS-CoV-2/FLU/RSV assay is intended as an aid in  the diagnosis of influenza from Nasopharyngeal swab specimens and  should not be used as a sole basis for treatment. Nasal washings and  aspirates are unacceptable for Xpert Xpress SARS-CoV-2/FLU/RSV  testing.  Fact Sheet for Patients: PinkCheek.be  Fact Sheet for Healthcare Providers: GravelBags.it  This test is not yet approved or cleared by the [place] FDA and  has been authorized for detection and/or diagnosis of SARS-CoV-2 by  FDA under an Emergency Use Authorization (EUA). This EUA will remain  in effect (meaning this test can be used) for the duration of the  Covid-19 declaration under Section 564(b)(1) of the Act, 21  U.S.C. section 360bbb-3(b)(1), unless the authorization is  terminated or revoked. Performed at [facility], [address] [place], [place] [address]   CBG monitoring, ED     Status: Abnormal   Collection Time: 02/05/20 10:09 PM  Result Value Ref Range   Glucose-Capillary 222 (H) 70 - 99 mg/dL    Comment: Glucose reference range applies only to samples taken after fasting for at least 8 hours.  Glucose, capillary     Status: Abnormal   Collection Time: 02/05/20 11:50 PM  Result Value Ref Range   Glucose-Capillary 193 (H) 70 - 99 mg/dL    Comment: Glucose reference range applies only to samples taken after fasting for at least 8 hours.  Glucose, capillary     Status: None   Collection Time: 02/06/20  4:41 AM  Result Value Ref Range   Glucose-Capillary 83 70 - 99 mg/dL    Comment: Glucose reference range applies only to samples taken after fasting for at least 8 hours.  Vitamin B12  Status: Abnormal   Collection Time: 02/06/20  4:52 AM  Result Value Ref Range   Vitamin B-12 126 (L) 180 - 914 pg/mL    Comment: (NOTE) This assay is not validated for testing neonatal or myeloproliferative syndrome  specimens for Vitamin B12 levels. Performed at [facility], [place] [address], [place], [place] [address]   Lipid panel     Status: None   Collection Time: 02/06/20  4:53 AM  Result Value Ref Range   Cholesterol 139 0 - 200 mg/dL   Triglycerides 52 <150 mg/dL   HDL 61 >40 mg/dL   Total CHOL/HDL Ratio 2.3 RATIO   VLDL 10 0 - 40 mg/dL   LDL Cholesterol 68 0 - 99 mg/dL    Comment:        Total Cholesterol/HDL:CHD Risk Coronary Heart Disease Risk Table                     Men   Women  1/2 Average Risk   3.4   3.3  Average Risk       5.0   4.4  2 X Average Risk   9.6   7.1  3 X Average Risk  23.4   11.0        Use the calculated Patient Ratio above and the CHD Risk Table to determine the patient's CHD Risk.        ATP III CLASSIFICATION (LDL):  <100     mg/dL   Optimal  100-129  mg/dL   Near or Above                    Optimal  130-159  mg/dL   Borderline  160-189  mg/dL   High  >190     mg/dL   Very High Performed at [facility], [address] [place], [place] [address]   Hemoglobin A1c     Status: Abnormal   Collection Time: 02/06/20  4:53 AM  Result Value Ref Range   Hgb A1c MFr Bld 7.1 (H) 4.8 - 5.6 %    Comment: (NOTE) Pre diabetes:          5.7%-6.4%  Diabetes:              >6.4%  Glycemic control for   <7.0% adults with diabetes    Mean Plasma Glucose 157.07 mg/dL    Comment: Performed at [facility] [address], [place], [place] [address]  Glucose, capillary     Status: Abnormal   Collection Time: 02/06/20  7:45 AM  Result Value Ref Range   Glucose-Capillary 109 (H) 70 - 99 mg/dL    Comment: Glucose reference range applies only to samples taken after fasting for at least 8 hours.  Glucose, capillary     Status: Abnormal   Collection Time: 02/06/20 11:22 AM  Result Value Ref Range   Glucose-Capillary 194 (H) 70 - 99 mg/dL    Comment: Glucose reference range applies only to samples taken after fasting for at least 8 hours.  TSH      Status: None   Collection Time: 02/06/20  2:31 PM  Result Value Ref Range   TSH 0.939 0.350 - 4.500 uIU/mL    Comment: Performed by a 3rd Generation assay with a functional sensitivity of <=0.01 uIU/mL. Performed at [facility], [address] [place], [place] [address]   RPR     Status: None   Collection Time: 02/06/20  2:31 PM  Result Value Ref Range   RPR Ser Ql NON REACTIVE NON REACTIVE    Comment: Performed at [facility], [place] [address], [place], [place] [address]  Vitamin B1     Status: None   Collection Time: 02/06/20  2:31 PM  Result Value Ref Range   Vitamin B1 (Thiamine) 85.6 66.5 - 200.0 nmol/L    Comment: (NOTE) This test was developed and its performance characteristics determined by [facility]. It has not been cleared or approved by the Food and Drug Administration. Performed At: [facility] [address] [place], [place] [id number] Rush Farmer MD AY:[id number]   HIV Antibody (routine testing w rflx)     Status: None   Collection Time: 02/06/20  2:31 PM  Result Value Ref Range   HIV Screen 4th Generation wRfx Non Reactive Non Reactive    Comment: (NOTE) Performed At: [facility] [place], [place] [id number] Rush Farmer MD RK:[id number]   Glucose, capillary     Status: Abnormal   Collection Time: 02/06/20  4:05 PM  Result Value Ref Range   Glucose-Capillary 173 (H) 70 - 99 mg/dL    Comment: Glucose reference range applies only to samples taken after fasting for at least 8 hours.  Glucose, capillary     Status: Abnormal   Collection Time: 02/06/20  8:27 PM  Result Value Ref Range   Glucose-Capillary 165 (H) 70 - 99 mg/dL    Comment: Glucose reference range applies only to samples taken after fasting for at least 8 hours.  CBC with Differential/Platelet     Status: Abnormal   Collection Time: 02/07/20  4:45 AM  Result Value Ref Range   WBC 4.1 4.0 - 10.5 K/uL   RBC 3.69 (L) 3.87 - 5.11 MIL/uL    Hemoglobin 9.6 (L) 12.0 - 15.0 g/dL   HCT 30.5 (L) 36.0 - 46.0 %   MCV 82.7 80.0 - 100.0 fL   MCH 26.0 26.0 - 34.0 pg   MCHC 31.5 30.0 - 36.0 g/dL   RDW 17.0 (H) 11.5 - 15.5 %   Platelets 256 150 - 400 K/uL   nRBC 0.0 0.0 - 0.2 %   Neutrophils Relative % 56 %   Neutro Abs 2.3 1.7 - 7.7 K/uL   Lymphocytes Relative 26 %   Lymphs Abs 1.1 0.7 - 4.0 K/uL   Monocytes Relative 10 %   Monocytes Absolute 0.4 0.1 - 1.0 K/uL   Eosinophils Relative 6 %   Eosinophils Absolute 0.2 0.0 - 0.5 K/uL   Basophils Relative 2 %   Basophils Absolute 0.1 0.0 - 0.1 K/uL   Immature Granulocytes 0 %   Abs Immature Granulocytes 0.01 0.00 - 0.07 K/uL    Comment: Performed at [facility], [address], [place], [place] [address]  Basic metabolic panel     Status: Abnormal   Collection Time: 02/07/20  4:45 AM  Result Value Ref Range   Sodium 139 135 - 145 mmol/L   Potassium 4.7 3.5 - 5.1 mmol/L   Chloride 101 98 - 111 mmol/L   CO2 28 22 - 32 mmol/L   Glucose, Bld 144 (H) 70 - 99 mg/dL    Comment: Glucose reference range applies only to samples taken after fasting for at least 8 hours.   BUN 18 8 - 23 mg/dL   Creatinine, Ser 0.96 0.44 - 1.00 mg/dL   Calcium 8.9 8.9 - 10.3 mg/dL   GFR, Estimated >60 >60 mL/min  Comment: (NOTE) Calculated using the CKD-EPI Creatinine Equation (2021)    Anion gap 10 5 - 15    Comment: Performed at [facility], [address] [place], [place] [address]  ESR     Status: Abnormal   Collection Time: 02/07/20  4:45 AM  Result Value Ref Range   Sed Rate 60 (H) 0 - 30 mm/hr    Comment: Performed at [facility], [address] [place], [place] [address]  Folate, serum, performed at [facility] lab     Status: Abnormal   Collection Time: 02/07/20  4:45 AM  Result Value Ref Range   Folate 4.2 (L) >5.9 ng/mL    Comment: Performed at [facility], [address] [place], [place] [address]  Folate RBC, whole blood, send-out to ref  lab     Status: Abnormal   Collection Time: 02/07/20  4:45 AM  Result Value Ref Range   Folate, Hemolysate 319.0 Not Estab. ng/mL   Hematocrit 31.2 (L) 34.0 - 46.6 %   Folate, RBC 1,022 >498 ng/mL    Comment: (NOTE) Performed At: [facility] [place], [place] [id number] Rush Farmer MD OZ:[id number]   Vitamin E     Status: Abnormal   Collection Time: 02/07/20  4:45 AM  Result Value Ref Range   Vitamin E (Alpha Tocopherol) 8.2 (L) 9.0 - 29.0 mg/L    Comment: (NOTE) This test was developed and its performance characteristics determined by [facility]. It has not been cleared or approved by the Food and Drug Administration.    Vitamin E(Gamma Tocopherol) 1.2 0.5 - 4.9 mg/L    Comment: (NOTE) This test was developed and its performance characteristics determined by [facility]. It has not been cleared or approved by the Food and Drug Administration. Reference intervals for alpha and gamma-tocopherol determined from [facility] and Nutrition Examination Survey, 2005-2006. Individuals with alpha-tocopherol levels less than 5.0 mg/L are considered vitamin E deficient. Performed At: [facility] [place], [place] [id number] Rush Farmer MD KG:[id number]   Copper, serum     Status: None   Collection Time: 02/07/20  4:45 AM  Result Value Ref Range   Copper 142 80 - 158 ug/dL    Comment: (NOTE) This test was developed and its performance characteristics determined by [facility]. It has not been cleared or approved by the Food and Drug Administration.                                Detection Limit = 5 Performed At: [facility] [place], [place] [id number] Rush Farmer MD [id number]:[id number]   Heavy metals, blood     Status: Abnormal   Collection Time: 02/07/20  4:45 AM  Result Value Ref Range   Arsenic <1 (L) 2 - 23 ug/L    Comment: (NOTE) This test was developed and its performance characteristics determined by  [facility]. It has not been cleared or approved by the Food and Drug Administration.                                Detection Limit = 1    Mercury <1.0 0.0 - 14.9 ug/L    Comment: (NOTE) This test was developed and its performance characteristics determined by [facility]. It has not been cleared or approved by the Food and Drug Administration.  Environmental Exposure:  <15.0                        Occupational Exposure:                         BEI - Inorganic Mercury: 15.0                                Detection Limit =  1.0 Performed At: [facility] [place], [place] [id number] Rush Farmer MD PZ:[id number]    Lead <1 0 - 4 ug/dL    Comment: (NOTE) Testing performed by [employer] Estate manager/land agent.                          Environmental Exposure:                           WHO Recommendation    <20                          Occupational Exposure:                           OSHA Lead Std          40                           BEI                    30                                Detection Limit =  1 This test was developed and its performance characteristics determined by [facility]. It has not been cleared or approved by the Food and Drug Administration.   VITAMIN D 25 Hydroxy (Vit-D Deficiency, Fractures)     Status: Abnormal   Collection Time: 02/07/20  4:45 AM  Result Value Ref Range   Vit D, 25-Hydroxy 26.5 (L) 30 - 100 ng/mL    Comment: Performed at [employer] (NOTE) Vitamin D deficiency has been defined by the [place] practice guideline as a level of serum 25-OH  vitamin D less than 20 ng/mL (1,2). The Endocrine Society went on to  further define vitamin D insufficiency as a level between 21 and 29  ng/mL (2).  1. IOM (Institute of Medicine). 2010. Dietary reference intakes for  calcium and D. [place]: The [employer]. 2. Holick MF, Binkley Merrill,  Bischoff-Ferrari HA, et al. Evaluation,  treatment, and prevention of vitamin D deficiency: an Endocrine  Society clinical practice guideline, JCEM. 2011 Jul; 96(7): 1911-30.  Performed at [facility], [place] [address] [place], [place] [address]   C-reactive protein     Status: Abnormal   Collection Time: 02/07/20  4:46 AM  Result Value Ref Range   CRP 7.0 (H) <1.0 mg/dL    Comment: Performed at [facility] [address], [place], [place] [address]  Ammonia     Status: Abnormal   Collection Time: 02/07/20  4:46 AM  Result Value Ref Range  Ammonia <9 (L) 9 - 35 umol/L    Comment: Performed at [facility], [address] [place], [place] [address]  Miscellaneous [facility] test (send-out)     Status: None   Collection Time: 02/07/20  4:46 AM  Result Value Ref Range   [facility] test code [id number]    [facility] test name NIACIN LEVEL     Comment: Performed at [facility], [address] [place], [place] [address]   Misc [facility] result COMMENT     Comment: (NOTE) Test Ordered: 767209 Vitamin B3 (Niacin+Metabolite) Nicotinamide                   11.0             ng/mL    BN     Reference Range: 5.2-72.1                              This test was developed and its performance characteristics determined by [facility]. It has not been cleared or approved by the Food and Drug Administration. Nicotinic Acid                 <5.0             ng/mL    BN     Reference Range: 0.0-5.0                               This test was developed and its performance characteristics determined by [facility]. It has not been cleared or approved by the Food and Drug Administration. Performed At: [facility] [place], [place] [id number] Rush Farmer MD [id number]:[id number]   Homocysteine     Status: None   Collection Time: 02/07/20  4:46 AM  Result Value Ref Range   Homocysteine 17.2 0.0 - 17.2 umol/L    Comment: (NOTE) Performed At: [facility] [place], [place] [id number] Rush Farmer MD [id number]   Vitamin B6     Status: None   Collection Time: 02/07/20  4:47 AM  Result Value Ref Range   Vitamin B6 2.6 2.0 - 32.8 ug/L    Comment: (NOTE) This test was developed and its performance characteristics determined by [facility]. It has not been cleared or approved by the Food and Drug Administration. Performed At: [facility] [place], [place] [id number] Rush Farmer MD YK:[id number]   Glucose, capillary     Status: Abnormal   Collection Time: 02/07/20  7:30 AM  Result Value Ref Range   Glucose-Capillary 117 (H) 70 - 99 mg/dL    Comment: Glucose reference range applies only to samples taken after fasting for at least 8 hours.  Glucose, capillary     Status: Abnormal   Collection Time: 02/07/20 11:54 AM  Result Value Ref Range   Glucose-Capillary 149 (H) 70 - 99 mg/dL    Comment: Glucose reference range applies only to samples taken after fasting for at least 8 hours.  Glucose, capillary     Status: Abnormal   Collection Time: 02/07/20  3:17 PM  Result Value Ref Range   Glucose-Capillary 179 (H) 70 - 99 mg/dL    Comment: Glucose reference range applies only to samples taken after fasting for at least 8 hours.  Glucose, capillary     Status: Abnormal   Collection Time: 02/07/20  8:03 PM  Result Value Ref Range   Glucose-Capillary 190 (H) 70 - 99 mg/dL    Comment: Glucose reference range applies only to samples taken after fasting for at least 8 hours.  Iron and TIBC     Status: Abnormal   Collection Time: 02/08/20  5:57 AM  Result Value Ref Range   Iron 34 28 - 170 ug/dL   TIBC 409 250 - 450 ug/dL   Saturation Ratios 8 (L) 10.4 - 31.8 %   UIBC 375 ug/dL    Comment: Performed at [facility], [address] [place], [place] [address]  Ferritin     Status: None   Collection Time: 02/08/20  5:57 AM  Result Value Ref Range   Ferritin 29 11 - 307 ng/mL     Comment: Performed at [facility], [address] [place], [place] [address]  CBC     Status: Abnormal   Collection Time: 02/08/20  5:57 AM  Result Value Ref Range   WBC 5.2 4.0 - 10.5 K/uL   RBC 4.07 3.87 - 5.11 MIL/uL   Hemoglobin 10.5 (L) 12.0 - 15.0 g/dL   HCT 33.8 (L) 36.0 - 46.0 %   MCV 83.0 80.0 - 100.0 fL   MCH 25.8 (L) 26.0 - 34.0 pg   MCHC 31.1 30.0 - 36.0 g/dL   RDW 16.5 (H) 11.5 - 15.5 %   Platelets 268 150 - 400 K/uL   nRBC 0.0 0.0 - 0.2 %    Comment: Performed at [facility], [address] [place],  [address]  Glucose, capillary     Status: Abnormal   Collection Time: 02/08/20  8:14 AM  Result Value Ref Range   Glucose-Capillary 113 (H) 70 - 99 mg/dL    Comment: Glucose reference range applies only to samples taken after fasting for at least 8 hours.  Glucose, capillary     Status: Abnormal   Collection Time: 02/08/20 11:33 AM  Result Value Ref Range   Glucose-Capillary 195 (H) 70 - 99 mg/dL    Comment: Glucose reference range applies only to samples taken after fasting for at least 8 hours.  Glucose, capillary     Status: Abnormal   Collection Time: 02/08/20  3:51 PM  Result Value Ref Range   Glucose-Capillary 130 (H) 70 - 99 mg/dL    Comment: Glucose reference range applies only to samples taken after fasting for at least 8 hours.  Glucose, capillary     Status: Abnormal   Collection Time: 02/08/20  9:00 PM  Result Value Ref Range   Glucose-Capillary 149 (H) 70 - 99 mg/dL    Comment: Glucose reference range applies only to samples taken after fasting for at least 8 hours.  Basic metabolic panel     Status: Abnormal   Collection Time: 02/09/20  4:12 AM  Result Value Ref Range   Sodium 140 135 - 145 mmol/L   Potassium 4.5 3.5 - 5.1 mmol/L   Chloride 103 98 - 111 mmol/L   CO2 28 22 - 32 mmol/L   Glucose, Bld 131 (H) 70 - 99 mg/dL    Comment: Glucose reference range applies only to samples taken after fasting for at least 8 hours.    BUN 21 8 - 23 mg/dL   Creatinine, Ser 0.99 0.44 - 1.00 mg/dL   Calcium 9.3 8.9 - 10.3 mg/dL   GFR, Estimated >60 >60 mL/min    Comment: (NOTE) Calculated using the CKD-EPI Creatinine Equation (2021)  Anion gap 9 5 - 15    Comment: Performed at [facility], [address] [place], [place] [address]  Magnesium     Status: None   Collection Time: 02/09/20  4:12 AM  Result Value Ref Range   Magnesium 2.0 1.7 - 2.4 mg/dL    Comment: Performed at [facility], [address] [place], [place] [address]  Glucose, capillary     Status: Abnormal   Collection Time: 02/09/20  8:32 AM  Result Value Ref Range   Glucose-Capillary 181 (H) 70 - 99 mg/dL    Comment: Glucose reference range applies only to samples taken after fasting for at least 8 hours.  Glucose, capillary     Status: Abnormal   Collection Time: 02/09/20 12:06 PM  Result Value Ref Range   Glucose-Capillary 151 (H) 70 - 99 mg/dL    Comment: Glucose reference range applies only to samples taken after fasting for at least 8 hours.  Glucose, capillary     Status: Abnormal   Collection Time: 02/09/20  3:54 PM  Result Value Ref Range   Glucose-Capillary 164 (H) 70 - 99 mg/dL    Comment: Glucose reference range applies only to samples taken after fasting for at least 8 hours.  Glucose, capillary     Status: Abnormal   Collection Time: 02/09/20  8:25 PM  Result Value Ref Range   Glucose-Capillary 138 (H) 70 - 99 mg/dL    Comment: Glucose reference range applies only to samples taken after fasting for at least 8 hours.  Glucose, capillary     Status: Abnormal   Collection Time: 02/10/20  7:26 AM  Result Value Ref Range   Glucose-Capillary 136 (H) 70 - 99 mg/dL    Comment: Glucose reference range applies only to samples taken after fasting for at least 8 hours.  Glucose, capillary     Status: Abnormal   Collection Time: 02/10/20 11:39 AM  Result Value Ref Range   Glucose-Capillary 153 (H) 70 - 99 mg/dL     Comment: Glucose reference range applies only to samples taken after fasting for at least 8 hours.   Objective  Body mass index is 36.61 kg/m. Wt Readings from Last 3 Encounters:  04/04/20 220 lb (99.8 kg)  03/01/20 220 lb (99.8 kg)  01/30/20 241 lb (109.3 kg)   Temp Readings from Last 3 Encounters:  12/27/19 98.2 F (36.8 C) (Oral)  12/19/19 98.2 F (36.8 C)  11/29/19 97.8 F (36.6 C) (Temporal)   BP Readings from Last 3 Encounters:  04/04/20 116/76  03/01/20 115/60  01/30/20 122/72   Pulse Readings from Last 3 Encounters:  04/04/20 (!) 47  01/30/20 (!) 55  12/27/19 71    Physical Exam Vitals and nursing note reviewed.  Constitutional:      Appearance: Normal appearance. She is well-developed and well-groomed. She is obese.  HENT:     Head: Normocephalic and atraumatic.  Eyes:     Conjunctiva/sclera: Conjunctivae normal.     Pupils: Pupils are equal, round, and reactive to light.  Cardiovascular:     Rate and Rhythm: Normal rate and regular rhythm.     Pulses:          Dorsalis pedis pulses are 1+ on the right side and 1+ on the left side.     Heart sounds: Normal heart sounds. No murmur heard.   Pulmonary:     Effort: Pulmonary effort is normal.     Breath sounds: Normal breath sounds.  Abdominal:     Tenderness: There is no abdominal tenderness.  Musculoskeletal:       Feet:  Feet:     Right foot:     Toenail Condition: Right toenails are abnormally thick.     Left foot:     Toenail Condition: Left toenails are abnormally thick.  Skin:    General: Skin is warm and dry.  Neurological:     General: No focal deficit present.     Mental Status: She is alert and oriented to person, place, and time. Mental status is at baseline.     Motor: Weakness present.     Gait: Gait abnormal.     Comments: bedbound on stretcher unable to move b/l lower ext or left upper extremity    Psychiatric:        Attention and Perception: Attention and perception normal.         Mood and Affect: Mood and affect normal.        Speech: Speech normal.        Behavior: Behavior normal. Behavior is cooperative.        Thought Content: Thought content normal.        Cognition and Memory: Cognition and memory normal.        Judgment: Judgment normal.     Assessment  Plan  Cerebrovascular accident (CVA), unspecified mechanism ([place]) due to decline in strength b/l legs which is new will order repeat MRI h/o left hemiparesis s/p 2 strokes 11/2019 and 01/2020 now with right lower leg weakness - Plan: MR Brain Wo Contrast, Ambulatory referral to Cardiology with persistent afib, Ambulatory referral to Neurology  D78 deficiency/folic acid - Plan: Vitamin B12,folic acid Ambulatory referral to Neurology  Folic acid deficiency - Plan: Folate, Ambulatory referral to Neurology  Recurrent UTI - Plan: CBC with Differential/Platelet, Urinalysis, Routine w reflex microscopic, Urine Culture, Ambulatory referral to Urology  Fatigue, unspecified type - Plan: CBC with Differential/Platelet, Comprehensive metabolic panel, Folate  Jaundice - Plan: Comprehensive metabolic panel,   Iron deficiency - Plan: CBC with Differential/Platelet, Iron and TIBC, Ferritin, el  Hypoxia - Plan: DG Chest 2 View O2 92 % with h/o persistent asthma rec f/u with leb pulm and use inhalers has not used today   Gangrene of toe of left foot ([place]) - Plan: DG Toe 2nd Left, Ambulatory referral to Podiatry  Persistent atrial fibrillation ([place]) - Plan: Ambulatory referral to Cardiology  Diabetic peripheral neuropathy associated with type 2 diabetes mellitus ([place]) 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 Lucky Cowboy as well   Osteoarthritis involving multiple joints Chronic right shoulder pain - Plan: Ambulatory referral to Orthopedic Surgery  Essential hypertension BP controlled today - Plan: Ambulatory referral to  Cardiology  Peripheral vascular disease due to secondary diabetes mellitus ([place]) - Plan: Ambulatory referral to Podiatry f/u Dr. Lucky Cowboy vascular   Cervical radiculitis Cubital tunnel syndrome on right F/u ortho/podiatry  Impingement syndrome of shoulder region, unspecified laterality - Plan: Ambulatory referral to Orthopedic Surgery  Diabetic ulcer of left heel associated with type 2 diabetes mellitus, with fat layer exposed ([place]) h/o PVD- Plan: Ambulatory referral to Podiatry, Ambulatory referral to Vascular Surgery  Physical deconditioning, recurrent falls/bedbound- Plan: Ambulatory referral to Neurology T[facility] but pt needs to go to hospital for 3 night stay and cant do this from outpatient standpoint She and husband unable to care for her  at home   Left arm pain - Plan: Ambulatory referral to Orthopedic Surgery  Primary osteoarthritis of left shoulder - Plan: Ambulatory referral to Orthopedic Surgery  Rotator cuff disorder, left - Plan: Ambulatory referral to Orthopedic Surgery  Ulcer of left heel and midfoot, unspecified ulcer stage (HCC) PAD (peripheral artery disease) ([place]) - Plan: Ambulatory referral to Vascular Surgery  Bradycardia, chronic Afib- Plan: Ambulatory referral to Cardiology    Provider: Dr. Olivia Mackie [name]-Internal Medicine

## 2020-04-04 NOTE — Patient Instructions (Addendum)
03/12/2020 Telephone [facility]  [place], [place] [address]  [phone]  Varney Baas, [place], [place] [address]  [phone] (Work)  [phone] (Fax)      02/19/2020 Initial consult [facility]  [place], [place] [address]  [phone]  Ray Church, [place]  [facility]  [place], [place] [address]  [phone] (Work)  [phone] (Fax)    Kristine Linea, [place], [place]  [place]  [place], [place] [address]  [phone] (Work)  [phone] (Fax)  [facility] (Patient Request)     [place]  [address]  [place], [place] [address]  [phone]  Langston Reusing, [place]  [place], [place] [address]  [phone] (Work)  [phone] ([address])    Scarlett Presto, [place]  [place], [place] [address]  [phone] (Work)  [phone] (Fax)  Arthritis of left glenohumeral joint (Primary Dx);  Rotator cuff tendinitis, left;  Neck pain;  Diabetic peripheral neuropathy associated with type 2 diabetes mellitus (CMS-HCC);  Morbid obesity with BMI of 40.0-44.9, adult (CMS-HCC)   12/11/2019 Office Visit [facility]  [place], [place] [address]  [phone]  Oran Rein, [place]  [facility] [place]  [place], [place] [address]  [phone] (Work)  [phone] (Fax)  Type 2 diabetes mellitus with polyneuropathy (CMS-HCC) (Primary Dx);  Neurotrophic ulcer of left foot limited to breakdown of skin (CMS-HCC);  Calcaneal gait   [facility] urology   [place] [phone]  Call for appt  Vascular Dr. Lucky Cowboy [place] pulmonology  [address] [place] [address] [phone]  04/05/2020    MRI brain Chest Xray  Left 2nd toe Xray

## 2020-04-04 NOTE — Telephone Encounter (Signed)
Patient 's husband called in wanted a call back first thing in morning Friday

## 2020-04-05 ENCOUNTER — Ambulatory Visit
Admission: RE | Admit: 2020-04-05 | Discharge: 2020-04-05 | Disposition: A | Discharge status: Home / Self Care | Payer: Medicare HMO | Source: Ambulatory Visit | Attending: Internal Medicine | Admitting: Internal Medicine

## 2020-04-05 ENCOUNTER — Other Ambulatory Visit: Payer: Self-pay

## 2020-04-05 ENCOUNTER — Telehealth: Payer: Self-pay | Admitting: Internal Medicine

## 2020-04-05 ENCOUNTER — Other Ambulatory Visit
Admission: RE | Admit: 2020-04-05 | Discharge: 2020-04-05 | Disposition: A | Discharge status: Home / Self Care | Payer: Medicare HMO | Source: Ambulatory Visit | Attending: Internal Medicine | Admitting: Internal Medicine

## 2020-04-05 DIAGNOSIS — E611 Iron deficiency: Secondary | ICD-10-CM | POA: Insufficient documentation

## 2020-04-05 DIAGNOSIS — I10 Essential (primary) hypertension: Secondary | ICD-10-CM | POA: Diagnosis not present

## 2020-04-05 DIAGNOSIS — E538 Deficiency of other specified B group vitamins: Secondary | ICD-10-CM

## 2020-04-05 DIAGNOSIS — R17 Unspecified jaundice: Secondary | ICD-10-CM

## 2020-04-05 DIAGNOSIS — R0902 Hypoxemia: Secondary | ICD-10-CM | POA: Insufficient documentation

## 2020-04-05 DIAGNOSIS — I639 Cerebral infarction, unspecified: Secondary | ICD-10-CM | POA: Insufficient documentation

## 2020-04-05 DIAGNOSIS — R5381 Other malaise: Secondary | ICD-10-CM | POA: Diagnosis not present

## 2020-04-05 DIAGNOSIS — N390 Urinary tract infection, site not specified: Secondary | ICD-10-CM | POA: Insufficient documentation

## 2020-04-05 DIAGNOSIS — M6281 Muscle weakness (generalized): Secondary | ICD-10-CM | POA: Diagnosis not present

## 2020-04-05 DIAGNOSIS — N1832 Chronic kidney disease, stage 3b: Secondary | ICD-10-CM

## 2020-04-05 DIAGNOSIS — I96 Gangrene, not elsewhere classified: Secondary | ICD-10-CM | POA: Insufficient documentation

## 2020-04-05 DIAGNOSIS — G9389 Other specified disorders of brain: Secondary | ICD-10-CM | POA: Diagnosis not present

## 2020-04-05 DIAGNOSIS — I6389 Other cerebral infarction: Secondary | ICD-10-CM | POA: Diagnosis not present

## 2020-04-05 DIAGNOSIS — M2550 Pain in unspecified joint: Secondary | ICD-10-CM | POA: Diagnosis not present

## 2020-04-05 DIAGNOSIS — I6340 Cerebral infarction due to embolism of unspecified cerebral artery: Secondary | ICD-10-CM | POA: Diagnosis not present

## 2020-04-05 DIAGNOSIS — D649 Anemia, unspecified: Secondary | ICD-10-CM | POA: Insufficient documentation

## 2020-04-05 DIAGNOSIS — W19XXXA Unspecified fall, initial encounter: Secondary | ICD-10-CM | POA: Diagnosis not present

## 2020-04-05 DIAGNOSIS — Z743 Need for continuous supervision: Secondary | ICD-10-CM | POA: Diagnosis not present

## 2020-04-05 DIAGNOSIS — Z7401 Bed confinement status: Secondary | ICD-10-CM | POA: Diagnosis not present

## 2020-04-05 DIAGNOSIS — R5383 Other fatigue: Secondary | ICD-10-CM | POA: Insufficient documentation

## 2020-04-05 LAB — CBC WITH DIFFERENTIAL/PLATELET
Abs Immature Granulocytes: 0.02 10*3/uL (ref 0.00–0.07)
Basophils Absolute: 0.1 10*3/uL (ref 0.0–0.1)
Basophils Relative: 1 %
Eosinophils Absolute: 0.2 10*3/uL (ref 0.0–0.5)
Eosinophils Relative: 3 %
HCT: 32.9 % — ABNORMAL LOW (ref 36.0–46.0)
Hemoglobin: 10 g/dL — ABNORMAL LOW (ref 12.0–15.0)
Immature Granulocytes: 0 %
Lymphocytes Relative: 13 %
Lymphs Abs: 0.9 10*3/uL (ref 0.7–4.0)
MCH: 26.4 pg (ref 26.0–34.0)
MCHC: 30.4 g/dL (ref 30.0–36.0)
MCV: 86.8 fL (ref 80.0–100.0)
Monocytes Absolute: 0.5 10*3/uL (ref 0.1–1.0)
Monocytes Relative: 7 %
Neutro Abs: 5.4 10*3/uL (ref 1.7–7.7)
Neutrophils Relative %: 76 %
Platelets: 247 10*3/uL (ref 150–400)
RBC: 3.79 MIL/uL — ABNORMAL LOW (ref 3.87–5.11)
RDW: 16.9 % — ABNORMAL HIGH (ref 11.5–15.5)
WBC: 7.1 10*3/uL (ref 4.0–10.5)
nRBC: 0 % (ref 0.0–0.2)

## 2020-04-05 LAB — COMPREHENSIVE METABOLIC PANEL
ALT: 14 U/L (ref 0–44)
AST: 18 U/L (ref 15–41)
Albumin: 3.3 g/dL — ABNORMAL LOW (ref 3.5–5.0)
Alkaline Phosphatase: 88 U/L (ref 38–126)
Anion gap: 12 (ref 5–15)
BUN: 25 mg/dL — ABNORMAL HIGH (ref 8–23)
CO2: 26 mmol/L (ref 22–32)
Calcium: 9.1 mg/dL (ref 8.9–10.3)
Chloride: 104 mmol/L (ref 98–111)
Creatinine, Ser: 1.13 mg/dL — ABNORMAL HIGH (ref 0.44–1.00)
GFR, Estimated: 52 mL/min — ABNORMAL LOW (ref >60)
Glucose, Bld: 102 mg/dL — ABNORMAL HIGH (ref 70–99)
Potassium: 4.7 mmol/L (ref 3.5–5.1)
Sodium: 142 mmol/L (ref 135–145)
Total Bilirubin: 0.5 mg/dL (ref 0.3–1.2)
Total Protein: 7.4 g/dL (ref 6.5–8.1)

## 2020-04-05 LAB — IRON AND TIBC
Iron: 34 ug/dL (ref 28–170)
Saturation Ratios: 9 % — ABNORMAL LOW (ref 10.4–31.8)
TIBC: 396 ug/dL (ref 250–450)
UIBC: 362 ug/dL

## 2020-04-05 LAB — FERRITIN: Ferritin: 45 ng/mL (ref 11–307)

## 2020-04-05 LAB — VITAMIN B12: Vitamin B-12: 947 pg/mL — ABNORMAL HIGH (ref 180–914)

## 2020-04-05 LAB — FOLATE: Folate: >100 ng/mL (ref >5.9)

## 2020-04-05 NOTE — Telephone Encounter (Signed)
Pt's husband would like a call back. He said the Education officer, museum over at [facility] would like a call. Her number is [phone].

## 2020-04-05 NOTE — Telephone Encounter (Signed)
Spoke with the Patient and she states that her husband went to the hospital yesterday and spoke with the Education officer, museum.  They were told by the social worker that she would not need to be admitted to the hospital for 3 days and that she could get her placed in the facility directly.   Informed the Patient that if the social worker can do this it will be fine. Informed her that she should still have her imagine and labs done today at the hospital and then go speak with the social worker to be admitted to a facility. Patient verbalized understanding and is now getting ready to go to the hospital by 10 30 for imaging   For your information

## 2020-04-05 NOTE — Addendum Note (Signed)
Addended by: Dhruv Christina J on: 04/05/2020 12:02 PM   Modules accepted: Orders  

## 2020-04-05 NOTE — Telephone Encounter (Signed)
Patient called and would like a call back.

## 2020-04-05 NOTE — Addendum Note (Signed)
Addended by: Lattie Haw on: 04/05/2020 12:02 PM   Modules accepted: Orders

## 2020-04-05 NOTE — Addendum Note (Signed)
Addended by: Yariel Ferraris J on: 04/05/2020 12:02 PM   Modules accepted: Orders  

## 2020-04-05 NOTE — Telephone Encounter (Signed)
Pt's husband wants to know if she needs to get a urinalysis while at the hospital. If so he needs someone to call and make sure they get it since she can't walk. I let him know we will give him a call back.

## 2020-04-05 NOTE — Telephone Encounter (Signed)
See if husband can come here and pick up stapled packet of papers and scan [place] 2 form please

## 2020-04-05 NOTE — Telephone Encounter (Signed)
Pt had MRI today [facility] +new stroke as was told to go to the ED for admission for SNF [facility] for PT/OT as she is bedbound confined to home and limited transportation due to mobility and husband unable to mobilize her at home   Per husband she went to the ED and was told she was not sick enough and sent home via EMS which she had to take for transport 04/04/20 office visit and 04/05/20 MRI   She is currently home  Called Dr. David Martinique who agrees to admission and given # to the Ed [phone] spoke to Dr. Joni Fears who is ED physician until midnight today and agrees to admission though not direct admit for medical w/u new recurrent stroke today MRI 04/05/20 (on Pradaxa and Eliquis now stroke x 3), being bedbound and assessment by PT/OT for SNF [facility] Mr. And Mrs. Daily and advised to take EMS back to the ED for admission for 3 nights to get Neurology, cardiology, hematology consult, PT/OT for SNF placement at [facility].  Mr. Hapke states he does not want her to get covid 19 in the ED though she seems partially agreeable to go to the ED. He stated he'd rather her die of natural causes than die of covid and refuses to go back to the ED   This is the only way she can get SNF placement for PT/OT and transport to speciality appointments [facility] Dr. [facility] [place]  Dr. [name] Dr. Roland Rack  Dr. Susa Day cardiology

## 2020-04-06 ENCOUNTER — Inpatient Hospital Stay
Admission: EM | Admit: 2020-04-06 | Discharge: 2020-04-15 | DRG: 065 | Disposition: A | Discharge status: Skilled Nursing Facility | Payer: Medicare HMO | Attending: Family Medicine | Admitting: Family Medicine

## 2020-04-06 ENCOUNTER — Encounter: Payer: Self-pay | Admitting: *Deleted

## 2020-04-06 ENCOUNTER — Telehealth: Payer: Self-pay | Admitting: Nurse Practitioner

## 2020-04-06 DIAGNOSIS — I6389 Other cerebral infarction: Secondary | ICD-10-CM

## 2020-04-06 DIAGNOSIS — Z96611 Presence of right artificial shoulder joint: Secondary | ICD-10-CM | POA: Diagnosis present

## 2020-04-06 DIAGNOSIS — R29898 Other symptoms and signs involving the musculoskeletal system: Secondary | ICD-10-CM

## 2020-04-06 DIAGNOSIS — Z96652 Presence of left artificial knee joint: Secondary | ICD-10-CM | POA: Diagnosis present

## 2020-04-06 DIAGNOSIS — K219 Gastro-esophageal reflux disease without esophagitis: Secondary | ICD-10-CM | POA: Diagnosis present

## 2020-04-06 DIAGNOSIS — Z841 Family history of disorders of kidney and ureter: Secondary | ICD-10-CM

## 2020-04-06 DIAGNOSIS — Z79899 Other long term (current) drug therapy: Secondary | ICD-10-CM

## 2020-04-06 DIAGNOSIS — Z7401 Bed confinement status: Secondary | ICD-10-CM

## 2020-04-06 DIAGNOSIS — I10 Essential (primary) hypertension: Secondary | ICD-10-CM

## 2020-04-06 DIAGNOSIS — Z888 Allergy status to other drugs, medicaments and biological substances status: Secondary | ICD-10-CM

## 2020-04-06 DIAGNOSIS — R0602 Shortness of breath: Secondary | ICD-10-CM | POA: Diagnosis not present

## 2020-04-06 DIAGNOSIS — Z811 Family history of alcohol abuse and dependence: Secondary | ICD-10-CM

## 2020-04-06 DIAGNOSIS — I4729 Other ventricular tachycardia: Secondary | ICD-10-CM

## 2020-04-06 DIAGNOSIS — Z808 Family history of malignant neoplasm of other organs or systems: Secondary | ICD-10-CM

## 2020-04-06 DIAGNOSIS — E1152 Type 2 diabetes mellitus with diabetic peripheral angiopathy with gangrene: Secondary | ICD-10-CM | POA: Diagnosis present

## 2020-04-06 DIAGNOSIS — I639 Cerebral infarction, unspecified: Secondary | ICD-10-CM | POA: Diagnosis not present

## 2020-04-06 DIAGNOSIS — Z8261 Family history of arthritis: Secondary | ICD-10-CM

## 2020-04-06 DIAGNOSIS — E1122 Type 2 diabetes mellitus with diabetic chronic kidney disease: Secondary | ICD-10-CM | POA: Diagnosis present

## 2020-04-06 DIAGNOSIS — J4541 Moderate persistent asthma with (acute) exacerbation: Secondary | ICD-10-CM

## 2020-04-06 DIAGNOSIS — Z881 Allergy status to other antibiotic agents status: Secondary | ICD-10-CM

## 2020-04-06 DIAGNOSIS — Z743 Need for continuous supervision: Secondary | ICD-10-CM | POA: Diagnosis not present

## 2020-04-06 DIAGNOSIS — Z7901 Long term (current) use of anticoagulants: Secondary | ICD-10-CM

## 2020-04-06 DIAGNOSIS — E11621 Type 2 diabetes mellitus with foot ulcer: Secondary | ICD-10-CM | POA: Diagnosis present

## 2020-04-06 DIAGNOSIS — I959 Hypotension, unspecified: Secondary | ICD-10-CM | POA: Diagnosis not present

## 2020-04-06 DIAGNOSIS — E43 Unspecified severe protein-calorie malnutrition: Secondary | ICD-10-CM | POA: Diagnosis present

## 2020-04-06 DIAGNOSIS — Z880 Allergy status to penicillin: Secondary | ICD-10-CM

## 2020-04-06 DIAGNOSIS — G4733 Obstructive sleep apnea (adult) (pediatric): Secondary | ICD-10-CM | POA: Diagnosis present

## 2020-04-06 DIAGNOSIS — I69354 Hemiplegia and hemiparesis following cerebral infarction affecting left non-dominant side: Secondary | ICD-10-CM

## 2020-04-06 DIAGNOSIS — Z86718 Personal history of other venous thrombosis and embolism: Secondary | ICD-10-CM

## 2020-04-06 DIAGNOSIS — Z823 Family history of stroke: Secondary | ICD-10-CM

## 2020-04-06 DIAGNOSIS — Z20822 Contact with and (suspected) exposure to covid-19: Secondary | ICD-10-CM | POA: Diagnosis present

## 2020-04-06 DIAGNOSIS — Z87442 Personal history of urinary calculi: Secondary | ICD-10-CM

## 2020-04-06 DIAGNOSIS — F419 Anxiety disorder, unspecified: Secondary | ICD-10-CM | POA: Diagnosis present

## 2020-04-06 DIAGNOSIS — Z8249 Family history of ischemic heart disease and other diseases of the circulatory system: Secondary | ICD-10-CM

## 2020-04-06 DIAGNOSIS — R001 Bradycardia, unspecified: Secondary | ICD-10-CM | POA: Diagnosis not present

## 2020-04-06 DIAGNOSIS — M6281 Muscle weakness (generalized): Secondary | ICD-10-CM | POA: Diagnosis not present

## 2020-04-06 DIAGNOSIS — I480 Paroxysmal atrial fibrillation: Secondary | ICD-10-CM | POA: Diagnosis present

## 2020-04-06 DIAGNOSIS — E669 Obesity, unspecified: Secondary | ICD-10-CM

## 2020-04-06 DIAGNOSIS — I7300 Raynaud's syndrome without gangrene: Secondary | ICD-10-CM

## 2020-04-06 DIAGNOSIS — I4819 Other persistent atrial fibrillation: Secondary | ICD-10-CM | POA: Diagnosis present

## 2020-04-06 DIAGNOSIS — E66813 Obesity, class 3: Secondary | ICD-10-CM | POA: Diagnosis present

## 2020-04-06 DIAGNOSIS — K589 Irritable bowel syndrome without diarrhea: Secondary | ICD-10-CM | POA: Diagnosis present

## 2020-04-06 DIAGNOSIS — Z6841 Body Mass Index (BMI) 40.0 and over, adult: Secondary | ICD-10-CM

## 2020-04-06 DIAGNOSIS — I129 Hypertensive chronic kidney disease with stage 1 through stage 4 chronic kidney disease, or unspecified chronic kidney disease: Secondary | ICD-10-CM | POA: Diagnosis present

## 2020-04-06 DIAGNOSIS — Z886 Allergy status to analgesic agent status: Secondary | ICD-10-CM

## 2020-04-06 DIAGNOSIS — E1142 Type 2 diabetes mellitus with diabetic polyneuropathy: Secondary | ICD-10-CM | POA: Diagnosis present

## 2020-04-06 DIAGNOSIS — R0902 Hypoxemia: Secondary | ICD-10-CM | POA: Diagnosis not present

## 2020-04-06 DIAGNOSIS — E039 Hypothyroidism, unspecified: Secondary | ICD-10-CM | POA: Diagnosis present

## 2020-04-06 DIAGNOSIS — G894 Chronic pain syndrome: Secondary | ICD-10-CM | POA: Diagnosis present

## 2020-04-06 DIAGNOSIS — Z833 Family history of diabetes mellitus: Secondary | ICD-10-CM

## 2020-04-06 DIAGNOSIS — M1990 Unspecified osteoarthritis, unspecified site: Secondary | ICD-10-CM | POA: Diagnosis present

## 2020-04-06 DIAGNOSIS — Z79890 Hormone replacement therapy (postmenopausal): Secondary | ICD-10-CM

## 2020-04-06 DIAGNOSIS — Z885 Allergy status to narcotic agent status: Secondary | ICD-10-CM

## 2020-04-06 DIAGNOSIS — I6340 Cerebral infarction due to embolism of unspecified cerebral artery: Principal | ICD-10-CM | POA: Diagnosis present

## 2020-04-06 DIAGNOSIS — F32A Depression, unspecified: Secondary | ICD-10-CM | POA: Diagnosis present

## 2020-04-06 DIAGNOSIS — I472 Ventricular tachycardia: Secondary | ICD-10-CM | POA: Diagnosis not present

## 2020-04-06 DIAGNOSIS — N189 Chronic kidney disease, unspecified: Secondary | ICD-10-CM | POA: Diagnosis present

## 2020-04-06 DIAGNOSIS — J45909 Unspecified asthma, uncomplicated: Secondary | ICD-10-CM | POA: Diagnosis present

## 2020-04-06 DIAGNOSIS — Z818 Family history of other mental and behavioral disorders: Secondary | ICD-10-CM

## 2020-04-06 DIAGNOSIS — Z9884 Bariatric surgery status: Secondary | ICD-10-CM

## 2020-04-06 DIAGNOSIS — E785 Hyperlipidemia, unspecified: Secondary | ICD-10-CM | POA: Diagnosis present

## 2020-04-06 DIAGNOSIS — Z803 Family history of malignant neoplasm of breast: Secondary | ICD-10-CM

## 2020-04-06 LAB — BASIC METABOLIC PANEL
Anion gap: 11 (ref 5–15)
BUN: 25 mg/dL — ABNORMAL HIGH (ref 8–23)
CO2: 27 mmol/L (ref 22–32)
Calcium: 9.1 mg/dL (ref 8.9–10.3)
Chloride: 102 mmol/L (ref 98–111)
Creatinine, Ser: 1.11 mg/dL — ABNORMAL HIGH (ref 0.44–1.00)
GFR, Estimated: 53 mL/min — ABNORMAL LOW (ref >60)
Glucose, Bld: 107 mg/dL — ABNORMAL HIGH (ref 70–99)
Potassium: 4.8 mmol/L (ref 3.5–5.1)
Sodium: 140 mmol/L (ref 135–145)

## 2020-04-06 LAB — ETHANOL: Alcohol, Ethyl (B): <10 mg/dL (ref <10)

## 2020-04-06 LAB — APTT: aPTT: 35 seconds (ref 24–36)

## 2020-04-06 LAB — RESP PANEL BY RT-PCR (FLU A&B, COVID) ARPGX2
Influenza A by PCR: NEGATIVE
Influenza B by PCR: NEGATIVE
SARS Coronavirus 2 by RT PCR: NEGATIVE

## 2020-04-06 LAB — PROTIME-INR
INR: 1.6 — ABNORMAL HIGH (ref 0.8–1.2)
Prothrombin Time: 18 seconds — ABNORMAL HIGH (ref 11.4–15.2)

## 2020-04-06 MED ORDER — AMLODIPINE BESYLATE 5 MG PO TABS
5.0000 mg | ORAL_TABLET | Freq: Every day | ORAL | Status: DC
  Administered 2020-04-06 – 2020-04-15 (×10): 5 mg via ORAL
  Filled 2020-04-06 (×10): qty 1

## 2020-04-06 MED ORDER — DULOXETINE HCL 60 MG PO CPEP
60.0000 mg | ORAL_CAPSULE | Freq: Every day | ORAL | Status: DC
  Administered 2020-04-07 – 2020-04-15 (×9): 60 mg via ORAL
  Filled 2020-04-06 (×10): qty 1

## 2020-04-06 MED ORDER — THIAMINE HCL 100 MG PO TABS
50.0000 mg | ORAL_TABLET | Freq: Two times a day (BID) | ORAL | Status: DC
  Administered 2020-04-06 – 2020-04-15 (×18): 50 mg via ORAL
  Filled 2020-04-06 (×17): qty 1

## 2020-04-06 MED ORDER — ACETAMINOPHEN 325 MG PO TABS
650.0000 mg | ORAL_TABLET | ORAL | Status: DC | PRN
  Administered 2020-04-07: 650 mg via ORAL
  Filled 2020-04-06: qty 2

## 2020-04-06 MED ORDER — IPRATROPIUM-ALBUTEROL 0.5-2.5 (3) MG/3ML IN SOLN
3.0000 mL | Freq: Four times a day (QID) | RESPIRATORY_TRACT | Status: DC
  Administered 2020-04-06 – 2020-04-08 (×6): 3 mL via RESPIRATORY_TRACT
  Filled 2020-04-06 (×7): qty 3

## 2020-04-06 MED ORDER — ACETAMINOPHEN 650 MG RE SUPP: 650.0000 mg | RECTAL | Status: DC | PRN

## 2020-04-06 MED ORDER — MOMETASONE FURO-FORMOTEROL FUM 200-5 MCG/ACT IN AERO
2.0000 | INHALATION_SPRAY | Freq: Two times a day (BID) | RESPIRATORY_TRACT | Status: DC
  Administered 2020-04-06 – 2020-04-15 (×13): 2 via RESPIRATORY_TRACT
  Filled 2020-04-06: qty 8.8

## 2020-04-06 MED ORDER — ADULT MULTIVITAMIN W/MINERALS CH
1.0000 | ORAL_TABLET | Freq: Every day | ORAL | Status: DC
  Administered 2020-04-06 – 2020-04-15 (×10): 1 via ORAL
  Filled 2020-04-06 (×9): qty 1

## 2020-04-06 MED ORDER — LINAGLIPTIN 5 MG PO TABS
5.0000 mg | ORAL_TABLET | Freq: Every day | ORAL | Status: DC
  Administered 2020-04-06 – 2020-04-15 (×10): 5 mg via ORAL
  Filled 2020-04-06 (×11): qty 1

## 2020-04-06 MED ORDER — FOLIC ACID 1 MG PO TABS
1.0000 mg | ORAL_TABLET | Freq: Every day | ORAL | Status: DC
  Administered 2020-04-06 – 2020-04-15 (×10): 1 mg via ORAL
  Filled 2020-04-06 (×10): qty 1

## 2020-04-06 MED ORDER — VITAMIN B-12 1000 MCG PO TABS
1000.0000 ug | ORAL_TABLET | Freq: Every day | ORAL | Status: DC
  Administered 2020-04-07 – 2020-04-15 (×9): 1000 ug via ORAL
  Filled 2020-04-06 (×10): qty 1

## 2020-04-06 MED ORDER — HYDROCHLOROTHIAZIDE 12.5 MG PO CAPS
12.5000 mg | ORAL_CAPSULE | Freq: Every day | ORAL | Status: DC
  Administered 2020-04-07 – 2020-04-10 (×4): 12.5 mg via ORAL
  Filled 2020-04-06 (×4): qty 1

## 2020-04-06 MED ORDER — LEVOTHYROXINE SODIUM 50 MCG PO TABS
175.0000 ug | ORAL_TABLET | Freq: Every day | ORAL | Status: DC
  Administered 2020-04-07 – 2020-04-15 (×9): 175 ug via ORAL
  Filled 2020-04-06 (×9): qty 1

## 2020-04-06 MED ORDER — STROKE: EARLY STAGES OF RECOVERY BOOK: Freq: Once | Status: DC

## 2020-04-06 MED ORDER — LOSARTAN POTASSIUM 50 MG PO TABS
50.0000 mg | ORAL_TABLET | Freq: Every day | ORAL | Status: DC
  Administered 2020-04-07 – 2020-04-15 (×9): 50 mg via ORAL
  Filled 2020-04-06 (×9): qty 1

## 2020-04-06 MED ORDER — VITAMIN D (ERGOCALCIFEROL) 1.25 MG (50000 UNIT) PO CAPS
50000.0000 [IU] | ORAL_CAPSULE | ORAL | Status: DC
  Administered 2020-04-12: 50000 [IU] via ORAL
  Filled 2020-04-06: qty 1

## 2020-04-06 MED ORDER — BUPROPION HCL ER (XL) 150 MG PO TB24
150.0000 mg | ORAL_TABLET | Freq: Every day | ORAL | Status: DC
  Administered 2020-04-07 – 2020-04-15 (×9): 150 mg via ORAL
  Filled 2020-04-06 (×9): qty 1

## 2020-04-06 MED ORDER — LOSARTAN POTASSIUM-HCTZ 50-12.5 MG PO TABS: 1.0000 | ORAL_TABLET | Freq: Every day | ORAL | Status: DC

## 2020-04-06 MED ORDER — VITAMIN D 25 MCG (1000 UNIT) PO TABS
1000.0000 [IU] | ORAL_TABLET | Freq: Two times a day (BID) | ORAL | Status: DC
  Administered 2020-04-07 – 2020-04-15 (×17): 1000 [IU] via ORAL
  Filled 2020-04-06 (×16): qty 1

## 2020-04-06 MED ORDER — METFORMIN HCL 500 MG PO TABS
500.0000 mg | ORAL_TABLET | Freq: Every day | ORAL | Status: DC
  Administered 2020-04-07 – 2020-04-10 (×4): 500 mg via ORAL
  Filled 2020-04-06 (×4): qty 1

## 2020-04-06 MED ORDER — ALBUTEROL SULFATE (2.5 MG/3ML) 0.083% IN NEBU: 2.5000 mg | INHALATION_SOLUTION | RESPIRATORY_TRACT | Status: DC | PRN

## 2020-04-06 MED ORDER — ROSUVASTATIN CALCIUM 10 MG PO TABS
20.0000 mg | ORAL_TABLET | Freq: Every day | ORAL | Status: DC
  Administered 2020-04-06 – 2020-04-15 (×10): 20 mg via ORAL
  Filled 2020-04-06 (×3): qty 2
  Filled 2020-04-06 (×2): qty 1
  Filled 2020-04-06 (×3): qty 2
  Filled 2020-04-06 (×2): qty 1
  Filled 2020-04-06: qty 4

## 2020-04-06 MED ORDER — ALPRAZOLAM 0.5 MG PO TABS
0.5000 mg | ORAL_TABLET | Freq: Every evening | ORAL | Status: DC | PRN
  Administered 2020-04-08 – 2020-04-14 (×5): 0.5 mg via ORAL
  Filled 2020-04-06 (×5): qty 1

## 2020-04-06 MED ORDER — APIXABAN 5 MG PO TABS
5.0000 mg | ORAL_TABLET | Freq: Two times a day (BID) | ORAL | Status: DC
  Administered 2020-04-06 – 2020-04-15 (×18): 5 mg via ORAL
  Filled 2020-04-06 (×18): qty 1

## 2020-04-06 MED ORDER — METOPROLOL SUCCINATE ER 50 MG PO TB24
50.0000 mg | ORAL_TABLET | Freq: Every day | ORAL | Status: DC
  Administered 2020-04-07 – 2020-04-15 (×9): 50 mg via ORAL
  Filled 2020-04-06 (×9): qty 1

## 2020-04-06 MED ORDER — ACETAMINOPHEN 160 MG/5ML PO SOLN
650.0000 mg | ORAL | Status: DC | PRN
  Filled 2020-04-06: qty 20.3

## 2020-04-06 NOTE — ED Notes (Signed)
Mess age sent to receiving RN. Awaiting reply.

## 2020-04-06 NOTE — ED Notes (Signed)
Will give rest of medications when verified by pharmacy.

## 2020-04-06 NOTE — H&P (Incomplete)
History and Physical   [name] [id number] DOB: [birth year] DOA: 04/06/2020  PCP: [name], [name], MD  Outpatient Specialists: [facility] Patient coming from: home  I have personally briefly reviewed patient's old medical records in [place].  Chief Concern: weakness   HPI: [name] is a 70 y.o. adult with medical history significant for CVA, hypertension, diabetes mellitus type 2, morbid obesity, chronic pain, paroxysmal atrial fibrillation, previous DVT, OSA on CPAP, presented to the emergency department for chief concerns of primary care provider telling her to come to the emergency department for further evaluation due to new stroke.  She reports that since December 2021 she has not been able to ambulate, thus prompting her to be evaluated by her primary care provider in January 2022.  MRI was done on 04/05/2020 which showed punctate focus of restricted diffusion in the right external capsule region consistent with acute small vessel infarction.  Few smudgy areas of restricted diffusion in the right side splenium of the corpus callosum could also represent small vessel infarction.  Differential diagnosis include demyelinating disease.  She reports in October 2021, when she initially experienced stroke like symptoms, she developed double vision and difficulty walking. She states she developed difficulty walking again and November and was diagnosed with a stroke.  When she was discharged home after November 2021 she never really improved.  Her weakness worsened and progressed to her inability to ambulate.  She is now chronically bedbound.  She reports her weakness and pain of the lower extremity has been unchanged.  She does not have any double vision at this time.  Review of system was negative for chest pain, shortness of breath, nausea vomiting, dizziness, dysuria, hematuria.  ROS: Constitutional: no weight change, no fever ENT/Mouth: no sore throat, no  rhinorrhea Eyes: no eye pain, no vision changes Cardiovascular: no chest pain, no dyspnea,  no edema, no palpitations Respiratory: no cough, no sputum, no wheezing Gastrointestinal: no nausea, no vomiting, no diarrhea, no constipation Genitourinary: no urinary incontinence, no dysuria, no hematuria Musculoskeletal: no arthralgias, no myalgias Skin: no skin lesions, no pruritus, Neuro: + weakness, no loss of consciousness, no syncope Psych: no anxiety, no depression, + decrease appetite Heme/Lymph: no bruising, no bleeding  ED Course: Discussed with ED provider, patient required hospitalization due to new stroke on MRI per outpatient order.  Vitals in the ED was afebrile, respiration rate was within normal limits, heart rate was within normal limits, blood pressure was appropriate.  She is satting at 96% on room air.  Labs were unremarkable.  B12 level collected palpation was 947.  Assessment/Plan  Active Problems:   Stroke [facility])   Recurrent stroke/acute -Present on admission - Recent stroke in October and November 2021 - New MRI imaging concerning for demyelinating disease - Neurology has been consulted, Dr. Cheral Marker will see patient - Resumed home Eliquis  Bilateral lower extremity foot wounds-present on admission - Wound has been consulted  Hypertension- controlled - Patient is outside of permissive hypertension window as the symptoms have been persistent since at least December 2021 - Resumed home antihypertensives including amlodipine 5 mg daily, losartan 50 mg daily, hydrochlorothiazide 12.5 mg daily, metoprolol succinate 50 mg daily  Atrial fibrillation-resumed Eliquis 5 mg p.o. twice daily  Hyperlipidemia-resumed home rosuvastatin 20 mg nightly  Anxiety/depression-resumed Xanax 0.5 mg as needed nightly for anxiety and sleep, bupropion 150 mg p.o. daily, duloxetine 60 mg daily  Hypothyroid-resumed home levothyroxine 175 mcg daily  Non-insulin-dependent diabetes  mellitus-resumed Tradjenta  5 mg daily, metformin 500 mg daily  Malnutrition in setting of history of gastric sleeve surgery- resumed folic acid 1 mg p.o. daily, B12 vitamin 1000 mcg daily, multivitamins, vitamin D D, thiamine  Chart reviewed.  Initial stroke admission in October 2021 patient presented with increased pain and weakness and was found to have a stroke.  She was previously on Pradaxa and switch to Eliquis.  Patient was discharged to SNF and eventually discharged home.  She read presented in November 2021 and in the ED MRI showed new CVA and patient was also managed for possible UTI.  Neurology was consulted and suspects patient had recurrent stroke due to her severe malnutrition given history of gastric sleeve and patient not taking vitamin supplements as prescribed, B12 deficiency can lead to hypercoagulable state.  CT of the chest, abdomen, pelvis on 02/07/2020 was negative for malignancies, and showed chronic and stable adrenal masses.  Eliquis was continued.  DVT prophylaxis: On Eliquis Code Status: Full code Diet: Heart healthy/carb modified Family Communication: Updated spouse at bedside Disposition Plan: Pending clinical course Consults called: Neurology Admission status: Observation  Past Medical History:  Diagnosis Date  . Abnormal antibody titer   . Anxiety and depression   . Arthritis   . Asthma   . Cystocele   . Depression   . Diabetes ([place])   . DVT (deep venous thrombosis) (HCC)    Righ calf  . Dyspnea    11/08/2018 - "more now due to lack of exercise"  . Dysrhythmia    afib  . Edema   . GERD (gastroesophageal reflux disease)   . Heart murmur    Echocardiogram June 2015: Mild MR (possible vegetation seen on TTE, not seen on TEE), normal LV size with moderate concentric LVH. Normal function EF 60-65%. Normal diastolic function. Mild LA dilation.  Marland Kitchen History of IBS   . History of kidney stones   . History of methicillin resistant staphylococcus aureus (MRSA)  2008  . Hyperlipidemia   . Hypertension   . Hypothyroidism   . Insomnia   . Kidney stone    kidney stones with lithotripsy .  [facility]- "adrenal glands"  . Neuropathy involving both lower extremities   . Obesity, Class II, BMI 35-39.9, with comorbidity   . Paroxysmal atrial fibrillation ([place]) 2007   In June 2015, Cardiac Event Monitor: Mostly SR/sinus arrhythmia with PVCs that are frequent. Short bursts of A. fib lasting several minutes;; CHA2DS2-VASc Score = 5 (age, Female, PVD, DM, HTN)  . Peripheral vascular disease ([place])   . Sleep apnea    use C-PAP  . Urinary incontinence   . Venous stasis dermatitis of both lower extremities    Past Surgical History:  Procedure Laterality Date  . ANKLE SURGERY Right   . APPLICATION OF WOUND VAC Left 06/27/2015   Procedure: APPLICATION OF WOUND VAC ( POSSIBLE ) ;  Surgeon: [name], MD;  Location: [facility] ORS;  Service: Vascular;  Laterality: Left;  . APPLICATION OF WOUND VAC Left 09/25/2016   Procedure: APPLICATION OF WOUND VAC;  Surgeon: [name], DPM;  Location: [facility] ORS;  Service: Podiatry;  Laterality: Left;  . arm surgery     right    . CHOLECYSTECTOMY    . COLONOSCOPY    . COLONOSCOPY WITH PROPOFOL N/A 01/11/2017   Procedure: COLONOSCOPY WITH PROPOFOL;  Surgeon: [name], MD;  [facility] END[facility]COPY;  Service: Endoscopy;  Laterality: N/A;  . COLONOSCOPY WITH PROPOFOL N/A 02/22/2017   Procedure: COLONOSCOPY  WITH PROPOFOL;  Surgeon: [name], MD;  Location: [facility] ENDOSCOPY;  Service: Endoscopy;  Laterality: N/A;  . COLONOSCOPY WITH PROPOFOL N/A 05/31/2017   Procedure: COLONOSCOPY WITH PROPOFOL;  Surgeon: [name], MD;  Location: [facility] ENDOSCOPY;  Service: Endoscopy;  Laterality: N/A;  . ESOPHAGOGASTRODUODENOSCOPY (EGD) WITH PROPOFOL N/A 01/11/2017   Procedure: ESOPHAGOGASTRODUODENOSCOPY (EGD) WITH PROPOFOL;  Surgeon: [name], MD;  Location: [facility] ENDOSCOPY;  Service: Endoscopy;  Laterality:  N/A;  . ESOPHAGOGASTRODUODENOSCOPY (EGD) WITH PROPOFOL N/A 10/25/2018   Procedure: ESOPHAGOGASTRODUODENOSCOPY (EGD) WITH PROPOFOL;  Surgeon: [name], MD;  Location: [facility] ENDOSCOPY;  Service: Endoscopy;  Laterality: N/A;  . ESOPHAGOGASTRODUODENOSCOPY (EGD) WITH PROPOFOL N/A 11/29/2019   Procedure: ESOPHAGOGASTRODUODENOSCOPY (EGD) WITH PROPOFOL;  Surgeon: Toledo, Benay Pike, MD;  Location: [facility] ENDOSCOPY;  Service: Gastroenterology;  Laterality: N/A;  . HERNIA REPAIR     umbilical  . HIATAL HERNIA REPAIR    . I & D EXTREMITY Left 06/27/2015   Procedure: IRRIGATION AND DEBRIDEMENT EXTREMITY            ( CALF HEMATOMA ) POSSIBLE WOUND VAC;  Surgeon: [name], MD;  Location: [facility] ORS;  Service: Vascular;  Laterality: Left;  . INCISION AND DRAINAGE OF WOUND Left 09/25/2016   Procedure: IRRIGATION AND DEBRIDEMENT - PARTIAL RESECTION OF ACHILLES TENDON WITH WOUND VAC APPLICATION;  Surgeon: [name], DPM;  Location: [facility] ORS;  Service: Podiatry;  Laterality: Left;  . INCISION AND DRAINAGE OF WOUND Left 11/09/2018   Procedure: Excision of left foot wound with ACell placement;  Surgeon: Wallace Going, DO;  Location: [place];  Service: Plastics;  Laterality: Left;  . IRRIGATION AND DEBRIDEMENT ABSCESS Left 09/07/2016   Procedure: IRRIGATION AND DEBRIDEMENT ABSCESS LEFT HEEL;  Surgeon: [name], DPM;  Location: [facility] ORS;  Service: Podiatry;  Laterality: Left;  . JOINT REPLACEMENT  2013   left knee replacement  . KIDNEY SURGERY Right    kidney stones  . LITHOTRIPSY    . LOWER EXTREMITY ANGIOGRAPHY Left 04/04/2018   Procedure: LOWER EXTREMITY ANGIOGRAPHY;  Surgeon: [name], MD;  Location: [facility];  Service: Cardiovascular;  Laterality: Left;  . NM GATED MYOVIEW ([place] [facility])  February 2017   Likely breast attenuation. LOW RISK study. Normal EF 55-60%.  . OTHER SURGICAL HISTORY     08/2016 or 09/2016 surgery on achilles tenso h/o staph infection heal. Dr. [name]  .  removal of left hematoma Left    leg  . REVERSE SHOULDER ARTHROPLASTY Right 10/08/2015   Procedure: REVERSE SHOULDER ARTHROPLASTY;  Surgeon: [name], MD;  Location: [facility] ORS;  Service: Orthopedics;  Laterality: Right;  . SLEEVE GASTROPLASTY    . TONSILLECTOMY    . TOTAL KNEE ARTHROPLASTY Left   . TOTAL SHOULDER REPLACEMENT Right   . TRANSESOPHAGEAL ECHOCARDIOGRAM  08/08/2013   Mild LVH, EF 60-65%. Moderate LA dilation and mild RA dilation. Mild MR with no evidence of stenosis and no evidence of endocarditis. A false chordae is noted.  . TRANSTHORACIC ECHOCARDIOGRAM  08/03/2013   Mild-Moderate concentric LVH, EF 60-65%. Normal diastolic function. Mild LA dilation. Mild MR with possible vegetation  - not confirmed on TEE   . ULNAR NERVE TRANSPOSITION Right 08/06/2016   Procedure: ULNAR NERVE DECOMPRESSION/TRANSPOSITION;  Surgeon: [name], MD;  Location: [facility] ORS;  Service: Orthopedics;  Laterality: Right;  . UPPER GI ENDOSCOPY    . VAGINAL HYSTERECTOMY     Social History:  reports that she has never smoked.  She has never used smokeless tobacco. She reports that she does not drink alcohol and does not use drugs.  Allergies  Allergen Reactions  . Morphine And Related Other (See Comments) and Nausea Only    Patient becomes very confused Other reaction(s): Other (See Comments), Other (See Comments) Patient becomes very confused Patient becomes very confused   . Penicillins Hives, Shortness Of Breath, Swelling and Other (See Comments)    Facial swelling Has patient had a PCN reaction causing immediate rash, facial/tongue/throat swelling, SOB or lightheadedness with hypotension: Yes Has patient had a PCN reaction causing severe rash involving mucus membranes or skin necrosis: Yes Has patient had a PCN reaction that required hospitalization No Has patient had a PCN reaction occurring within the last 10 years: No If all of the above answers are "NO", then may proceed with  Cephalosporin use.  Other reaction(s): Other (See Comments), Other (See Comments) Facial swelling Has patient had a PCN reaction causing immediate rash, facial/tongue/throat swelling, SOB or lightheadedness with hypotension: Yes Has patient had a PCN reaction causing severe rash involving mucus membranes or skin necrosis: Yes Has patient had a PCN reaction that required hospitalization No Has patient had a PCN reaction occurring within the last 10 years: No If all of the above answers are "NO", then may proceed with Cephalosporin use. Facial swelling Has patient had a PCN reaction causing immediate rash, facial/tongue/throat swelling, SOB or lightheadedness with hypotension: Yes Has patient had a PCN reaction causing severe rash involving mucus membranes or skin necrosis: Yes Has patient had a PCN reaction that required hospitalization No Has patient had a PCN reaction occurring within the last 10 years: No If all of the above answers are "NO", then may proceed with Cephalosporin use. Facial swelling Has patient had a PCN reaction causing immediate rash, facial/tongue/throat swelling, SOB or lightheadedness with hypotension: Yes Has patient had a PCN reaction causing severe rash involving mucus membranes or skin necrosis: Yes Has patient had a PCN reaction that required hospitalization No Has patient had a PCN reaction occurring within the last 10 years: No If all of the above answers are "NO", then may proceed with Cephalosporin use.   . Ambien [Zolpidem]     Hallucination   . Aspirin Hives  . Oxytetracycline Hives    Other reaction(s): Unknown    Family History  Problem Relation Age of Onset  . Skin cancer Father   . Diabetes Father   . Hypertension Father   . Peripheral vascular disease Father   . Cancer Father   . Cerebral aneurysm Father   . Alcohol abuse Father   . Varicose Veins Mother   . Kidney disease Mother   . Arthritis Mother   . Mental illness Sister   . Cancer  Maternal Aunt        breast  . Breast cancer Maternal Aunt   . Arthritis Maternal Grandmother   . Hypertension Maternal Grandmother   . Diabetes Maternal Grandmother   . Arthritis Maternal Grandfather   . Heart disease Maternal Grandfather   . Hypertension Maternal Grandfather   . Arthritis Paternal Grandmother   . Hypertension Paternal Grandmother   . Diabetes Paternal Grandmother   . Arthritis Paternal Grandfather   . Hypertension Paternal Grandfather   . Bladder Cancer Neg Hx   . Kidney cancer Neg Hx    Family history: Family history reviewed and not pertinent  Prior to Admission medications   Medication Sig Start Date End Date Taking? Authorizing Provider  acetaminophen (TYLENOL) 325 MG tablet Take 2 tablets (650 mg total) by mouth every 6 (six) hours as needed for mild pain or moderate pain (or Fever >/= 101). 09/08/19   Guilford Shi, MD  albuterol (PROVENTIL) (2.5 MG/3ML) 0.083% nebulizer solution Take 2.5 mg by nebulization every 4 (four) hours as needed for wheezing or shortness of breath.    [provider]  ALPRAZolam Duanne Moron) 0.5 MG tablet Take 0.5 mg by mouth at bedtime as needed for anxiety or sleep.    [provider]  amLODipine (NORVASC) 5 MG tablet Take 1 tablet (5 mg total) by mouth daily. 11/28/19 02/26/20  Loel Dubonnet, NP  apixaban (ELIQUIS) 5 MG TABS tablet Take 1 tablet (5 mg total) by mouth 2 (two) times daily. 01/30/20   Loel Dubonnet, NP  buPROPion (WELLBUTRIN XL) 150 MG 24 hr tablet Take 1 tablet (150 mg total) by mouth daily. 05/02/19   [name], [name], MD  calcium-vitamin D (OSCAL WITH D) 500-200 MG-UNIT tablet Take 1 tablet by mouth 2 (two) times daily with a meal. 02/10/20   Nicole Kindred A, DO  cholecalciferol (VITAMIN D) 25 MCG tablet Take 1 tablet (1,000 Units total) by mouth 2 (two) times daily with a meal. 02/10/20   Nicole Kindred A, DO  clobetasol cream (TEMOVATE) [id number] % Apply 1 application topically 2 (two) times  daily as needed (skin irritation).     [provider]  colchicine 0.6 MG tablet Take 0.6 mg by mouth 2 (two) times daily as needed (acute gout flare).     [provider]  diclofenac Sodium (VOLTAREN) 1 % GEL Apply 2 g topically 3 (three) times daily. 02/10/20   Ezekiel Slocumb, DO  DULoxetine (CYMBALTA) 60 MG capsule Take 1 capsule (60 mg total) by mouth daily. 02/19/20 05/19/20  Milinda Pointer, MD  ergocalciferol (VITAMIN D2) 1.25 MG (50000 UT) capsule Take 50,000 Units by mouth once a week.    [provider]  estradiol (ESTRACE) 0.1 MG/GM vaginal cream Place 1 Applicatorful vaginally every Monday, Wednesday, and Friday. Apply 0.5mg  (pea-sized amount)  just inside the vaginal introitus with a finger-tip on Monday, Wednesday and Friday nights, 02/01/19   Zara Council A, PA-C  fluticasone-salmeterol (ADVAIR HFA) 115-21 MCG/ACT inhaler Inhale 2 puffs into the lungs daily. Rinse mouth thoroughly after use 08/16/19   Tyler Pita, MD  folic acid (FOLVITE) 1 MG tablet Take 1 tablet (1 mg total) by mouth daily. 02/11/20   Ezekiel Slocumb, DO  HYDROcodone-acetaminophen (NORCO/VICODIN) 5-325 MG tablet Take 1 tablet by mouth every 8 (eight) hours as needed for severe pain. Must last 30 days 02/19/20 03/20/20  Milinda Pointer, MD  HYDROcodone-acetaminophen (NORCO/VICODIN) 5-325 MG tablet Take 1 tablet by mouth every 8 (eight) hours as needed for severe pain. Must last 30 days 03/20/20 04/19/20  Milinda Pointer, MD  HYDROcodone-acetaminophen (NORCO/VICODIN) 5-325 MG tablet Take 1 tablet by mouth every 8 (eight) hours as needed for severe pain. Must last 30 days 04/19/20 05/19/20  Milinda Pointer, MD  levocetirizine (XYZAL) 5 MG tablet Take 0.5 tablets (2.5 mg total) by mouth every evening. Patient taking differently: Take 5 mg by mouth at bedtime as needed for allergies. 09/07/19   [name], [name], MD  levothyroxine (SYNTHROID) 175 MCG tablet Take 1  tablet (175 mcg total) by mouth daily before breakfast. 02/27/20   [name], [name], MD  losartan-hydrochlorothiazide (HYZAAR) 50-12.5 MG tablet Take 1 tablet by mouth daily.    [provider]  metFORMIN (GLUCOPHAGE) 500 MG tablet Take 1 tablet (500 mg total) by mouth daily with breakfast. With food 02/12/20   [name], [name], MD  metoprolol succinate (TOPROL-XL) 50 MG 24 hr tablet TAKE 1 TABLET BY MOUTH DAILY Patient taking differently: Take 50 mg by mouth daily. 11/09/19   Loel Dubonnet, NP  Misc. Devices (TUB TRANSFER BOARD) MISC 1 Device by Does not apply route as needed. 02/14/20   [name], [name], MD  montelukast (SINGULAIR) 10 MG tablet Take 1 tablet (10 mg total) by mouth at bedtime. 02/27/20   [name], [name], MD  Multiple Vitamin (MULTIVITAMIN WITH MINERALS) TABS tablet Take 1 tablet by mouth daily. 02/11/20   Nicole Kindred A, DO  ondansetron (ZOFRAN) 4 MG tablet Take 1 tablet (4 mg total) by mouth every 8 (eight) hours as needed for nausea or vomiting. 02/14/19   [name], [name], MD  oxybutynin (DITROPAN-XL) 10 MG 24 hr tablet Take 10 mg by mouth at bedtime.    [provider]  pantoprazole (PROTONIX) 40 MG tablet Take 40 mg by mouth daily.  04/07/19 04/06/20  [provider]  pregabalin (LYRICA) 50 MG capsule Take 1 capsule (50 mg total) by mouth 3 (three) times daily. 02/19/20 05/19/20  Milinda Pointer, MD  rosuvastatin (CRESTOR) 20 MG tablet Take 1 tablet (20 mg total) by mouth daily. 01/30/20 07/28/20  Loel Dubonnet, NP  sitaGLIPtin (JANUVIA) 25 MG tablet Take 1 tablet (25 mg total) by mouth daily. 12/26/19   [name], [name], MD  sucralfate (CARAFATE) 1 GM/10ML suspension Take 1 g by mouth 3 (three) times daily.  12/06/19   [provider]  thiamine 100 MG tablet Take 0.5 tablets (50 mg total) by mouth 2 (two) times daily with a meal. 02/10/20   Nicole Kindred A, DO  TOVIAZ 8 MG TB24 tablet  TAKE ONE TABLET BY MOUTH EVERY DAY Patient taking differently: Take 8 mg by mouth daily. 08/07/19   McGowan, Hunt Oris, PA-C  trospium (SANCTURA) 20 MG tablet Take 20 mg by mouth 2 (two) times daily.    [provider]  vitamin B-12 1000 MCG tablet Take 1 tablet (1,000 mcg total) by mouth daily. 02/13/20   Ezekiel Slocumb, DO   Physical Exam: Vitals:   04/06/20 1543 04/06/20 1717 04/06/20 1830 04/06/20 1833  BP: 127/70 127/72 126/75 134/70  Pulse: 92 60  69  Resp: 19 18  18   Temp:      TempSrc:      SpO2: 94% 93%  94%   Constitutional: appears age-appropriate, NAD, calm, comfortable Eyes: PERRL, lids and conjunctivae normal ENMT: Mucous membranes are moist. Posterior pharynx clear of any exudate or lesions. Age-appropriate dentition. Hearing appropriate Neck: normal, supple, no masses, no thyromegaly Respiratory: clear to auscultation bilaterally, no wheezing, no crackles. Normal respiratory effort. No accessory muscle use.  Cardiovascular: Regular rate and rhythm, no murmurs / rubs / gallops. No extremity edema. 2+ pedal pulses. No carotid bruits.  Abdomen: Obese abdomen no tenderness, no masses palpated, no hepatosplenomegaly. Bowel sounds positive.  Musculoskeletal: no clubbing / cyanosis. No joint deformity upper and lower extremities. Good ROM, no contractures, no atrophy. Normal muscle tone.  Skin: no rashes, lesions, ulcers. No induration, Reynauds on right distal fingertips Neurologic: Sensation intact. Strength 5/5 in all 4.  Psychiatric: Normal judgment and insight. Alert and oriented x 3. Normal mood.   EKG: independently reviewed, showing   Chest x-ray on Admission: I personally reviewed and I agree with radiologist  reading as below.  DG Chest 2 View  Result Date: 04/05/2020 CLINICAL DATA:  Hypoxia. EXAM: CHEST - 2 VIEW COMPARISON:  02/05/2020 FINDINGS: Heart size remains within normal limits. Low lung volumes are again seen with stable elevation right  hemidiaphragm and right lower lobe scarring. No evidence of pulmonary consolidation or pleural effusion. Right shoulder prosthesis again seen. IMPRESSION: Stable low lung volumes and right lower lobe scarring. No acute findings. Electronically Signed   By: Marlaine Hind M.D.   On: 04/05/2020 12:06   MR Brain Wo Contrast  Result Date: 04/05/2020 CLINICAL DATA:  Stroke.  Bilateral leg weakness. EXAM: MRI HEAD WITHOUT CONTRAST TECHNIQUE: Multiplanar, multiecho pulse sequences of the brain and surrounding structures were obtained without intravenous contrast. COMPARISON:  02/05/2020 FINDINGS: Brain: Diffusion imaging shows a punctate focus of restricted diffusion in the right external capsule region consistent with acute small vessel infarction. There are a few smudgy areas of restricted diffusion in the right side of the splenium of the corpus callosum that could also represent small vessel infarctions. The differential diagnosis does include a demyelinating process. Elsewhere, chronic abnormal T2 and FLAIR signal within the deep and subcortical hemispheric white matter appears unchanged and most consistent with chronic small vessel disease. Coexistence demyelinating disease is not excluded. Right posterior parietal cortical and subcortical infarction is unchanged. Numerous dilated perivascular spaces throughout the basal ganglia regions. Few old small vessel insults also affect the thalami and brainstem. Previously seen acute infarctions have undergone expected evolutionary change with loss restricted diffusion. No evidence of mass, hemorrhage, hydrocephalus or extra-axial collection. Vascular: Major vessels at the base of the brain show flow. Skull and upper cervical spine: Negative Sinuses/Orbits: Clear/normal Other: None IMPRESSION: 1. Punctate focus of restricted diffusion in the right external capsule region consistent with acute small vessel infarction. Few smudgy areas of restricted diffusion in the right  side of the splenium of the corpus callosum that could also represent small vessel infarctions. The differential diagnosis does include demyelinating disease, but that is felt less likely. 2. Chronic small-vessel ischemic changes elsewhere throughout the brain as outlined above. Electronically Signed   By: Nelson Chimes M.D.   On: 04/05/2020 12:04   DG Toe 2nd Left  Result Date: 04/05/2020 CLINICAL DATA:  Left second toe gangrene. EXAM: LEFT SECOND TOE COMPARISON:  December 19, 2019. FINDINGS: Stable old fractures are seen involving the second and third proximal phalanges as well as probable severe degenerative changes seen involving the first metatarsophalangeal joint. No acute fracture or lytic destruction is noted. Soft tissues are unremarkable. IMPRESSION: No acute fracture or lytic destruction is noted to suggest osteomyelitis. Stable old fractures are seen involving the second and third proximal phalanges. Electronically Signed   By: Marijo Conception M.D.   On: 04/05/2020 14:49    Labs on Admission: I have personally reviewed following labs  CBC: Recent Labs  Lab 04/05/20 1215  WBC 7.1  NEUTROABS 5.4  HGB 10.0*  HCT 32.9*  MCV 86.8  PLT [id number]   Basic Metabolic Panel: Recent Labs  Lab 04/05/20 1215 04/06/20 1627  NA 142 140  K 4.7 4.8  CL 104 102  CO2 26 27  GLUCOSE 102* 107*  BUN 25* 25*  CREATININE 1.13* 1.11*  CALCIUM 9.1 9.1   GFR: Estimated Creatinine Clearance (by C-G formula based on SCr of 1.11 mg/dL (H)) Female: 55.2 mL/min (A) Female: 67.3 mL/min (A) Liver Function Tests: Recent Labs  Lab 04/05/20 1215  AST 18  ALT 14  ALKPHOS 88  BILITOT 0.5  PROT 7.4  ALBUMIN 3.3*   Coagulation Profile: Recent Labs  Lab 04/06/20 1639  INR 1.6*   Anemia Panel: Recent Labs    04/05/20 1215  VITAMINB12 947*  FOLATE >100.0  FERRITIN 45  TIBC 396  IRON 34   Urine analysis:    Component Value Date/Time   COLORURINE YELLOW (A) 02/04/2020 1537   APPEARANCEUR  CLOUDY (A) 02/04/2020 1537   APPEARANCEUR Clear 09/18/2019 1437   LABSPEC 1.011 02/04/2020 1537   PHURINE 7.0 02/04/2020 1537   GLUCOSEU NEGATIVE 02/04/2020 1537   GLUCOSEU NEGATIVE 08/02/2017 1427   HGBUR SMALL (A) 02/04/2020 1537   BILIRUBINUR NEGATIVE 02/04/2020 1537   BILIRUBINUR Negative 09/18/2019 1437   BILIRUBINUR 1+ 09/15/2017 1319   KETONESUR NEGATIVE 02/04/2020 1537   PROTEINUR NEGATIVE 02/04/2020 1537   UROBILINOGEN 0.2 09/15/2017 1319   UROBILINOGEN 0.2 08/02/2017 1427   NITRITE NEGATIVE 02/04/2020 1537   LEUKOCYTESUR LARGE (A) 02/04/2020 1537   Cloy Cozzens N Bernese Doffing D.O. [facility]  If 7PM-7AM, please contact overnight-coverage provider If 7AM-7PM, please contact day coverage provider [email or web address]  04/06/2020, 7:03 PM

## 2020-04-06 NOTE — ED Notes (Signed)
Cox, MD at bedside. 

## 2020-04-06 NOTE — H&P (Addendum)
History and Physical   [name] [id number] DOB: 1950-08-17 DOA: 04/06/2020  PCP: [name], [name], MD  Outpatient Specialists: [facility] Patient coming from: home  I have personally briefly reviewed patient's old medical records in [place].  Chief Concern: weakness   HPI: [name] is a 70 y.o. adult with medical history significant for CVA, hypertension, diabetes mellitus type 2, morbid obesity, chronic pain, paroxysmal atrial fibrillation, previous DVT, OSA on CPAP, presented to the emergency department for chief concerns of primary care provider telling her to come to the emergency department for further evaluation due to new stroke.  She reports that since December 2021 she has not been able to ambulate, thus prompting her to be evaluated by her primary care provider in January 2022.  MRI was done on 04/05/2020 which showed punctate focus of restricted diffusion in the right external capsule region consistent with acute small vessel infarction.  Few smudgy areas of restricted diffusion in the right side splenium of the corpus callosum could also represent small vessel infarction.  Differential diagnosis include demyelinating disease.  She reports in October 2021, when she initially experienced stroke like symptoms, she developed double vision and difficulty walking. She states she developed difficulty walking again and November and was diagnosed with a stroke.  When she was discharged home after November 2021 she never really improved.  Her weakness worsened and progressed to her inability to ambulate.  She is now chronically bedbound.  She reports her weakness and pain of the lower extremity has been unchanged.  She does not have any double vision at this time.  Review of system was negative for chest pain, shortness of breath, nausea vomiting, dizziness, dysuria, hematuria.  At bedside she is noted to have Reynauds phenomenon of her right fingertips. she  reports that this has been ongoing for about a year.  ROS: Constitutional: no weight change, no fever ENT/Mouth: no sore throat, no rhinorrhea Eyes: no eye pain, no vision changes Cardiovascular: no chest pain, no dyspnea,  no edema, no palpitations Respiratory: no cough, no sputum, no wheezing Gastrointestinal: no nausea, no vomiting, no diarrhea, no constipation Genitourinary: no urinary incontinence, no dysuria, no hematuria Musculoskeletal: no arthralgias, no myalgias Skin: no skin lesions, no pruritus, Neuro: + weakness, no loss of consciousness, no syncope Psych: no anxiety, no depression, + decrease appetite Heme/Lymph: no bruising, no bleeding  ED Course: Discussed with ED provider, patient required hospitalization due to new stroke on MRI per outpatient order.  Vitals in the ED was afebrile, respiration rate was within normal limits, heart rate was within normal limits, blood pressure was appropriate.  She is satting at 96% on room air.  Labs were unremarkable.  B12 level collected palpation was 947.  Assessment/Plan  Active Problems:   Stroke [facility])   Recurrent stroke/acute punctate focus of restricted diffusion in the right external capsule region consistent with acute small vessel infarction -Present on admission - Recent stroke in October and November 2021 - New MRI imaging concerning for demyelinating disease - Neurology has been consulted, Dr. Cheral Marker will see patient - Resumed home Eliquis  Bilateral lower extremity foot wounds-present on admission - Wound has been consulted  Hypertension- controlled - Patient is outside of permissive hypertension window as the symptoms have been persistent since at least December 2021 - Resumed home antihypertensives including amlodipine 5 mg daily, losartan 50 mg daily, hydrochlorothiazide 12.5 mg daily, metoprolol succinate 50 mg daily  Atrial fibrillation-resumed Eliquis 5 mg p.o. twice  daily  Hyperlipidemia-resumed home  rosuvastatin 20 mg nightly  Anxiety/depression-resumed Xanax 0.5 mg as needed nightly for anxiety and sleep, bupropion 150 mg p.o. daily, duloxetine 60 mg daily  Hypothyroid-resumed home levothyroxine 175 mcg daily  Non-insulin-dependent diabetes mellitus-resumed Tradjenta 5 mg daily, metformin 500 mg daily  Malnutrition in setting of history of gastric sleeve surgery- resumed folic acid 1 mg p.o. daily, B12 vitamin 1000 mcg daily, multivitamins, vitamin D D, thiamine  Chart reviewed.  Initial stroke admission in October 2021 patient presented with increased pain and weakness and was found to have a stroke.  She was previously on Pradaxa and switch to Eliquis.  Patient was discharged to SNF and eventually discharged home.  She read presented in November 2021 and in the ED MRI showed new CVA and patient was also managed for possible UTI.  Neurology was consulted and suspects patient had recurrent stroke due to her severe malnutrition given history of gastric sleeve and patient not taking vitamin supplements as prescribed, B12 deficiency can lead to hypercoagulable state.  CT of the chest, abdomen, pelvis on 02/07/2020 was negative for malignancies, and showed chronic and stable adrenal masses.  Eliquis was continued.  DVT prophylaxis: On Eliquis Code Status: Full code Diet: Heart healthy/carb modified Family Communication: Updated spouse at bedside Disposition Plan: Pending clinical course Consults called: Neurology Admission status: Observation  Past Medical History:  Diagnosis Date  . Abnormal antibody titer   . Anxiety and depression   . Arthritis   . Asthma   . Cystocele   . Depression   . Diabetes ([place])   . DVT (deep venous thrombosis) (HCC)    Righ calf  . Dyspnea    11/08/2018 - "more now due to lack of exercise"  . Dysrhythmia    afib  . Edema   . GERD (gastroesophageal reflux disease)   . Heart murmur    Echocardiogram June 2015: Mild MR (possible vegetation seen on  TTE, not seen on TEE), normal LV size with moderate concentric LVH. Normal function EF 60-65%. Normal diastolic function. Mild LA dilation.  Marland Kitchen History of IBS   . History of kidney stones   . History of methicillin resistant staphylococcus aureus (MRSA) 2008  . Hyperlipidemia   . Hypertension   . Hypothyroidism   . Insomnia   . Kidney stone    kidney stones with lithotripsy .  [facility]- "adrenal glands"  . Neuropathy involving both lower extremities   . Obesity, Class II, BMI 35-39.9, with comorbidity   . Paroxysmal atrial fibrillation ([place]) 2007   In June 2015, Cardiac Event Monitor: Mostly SR/sinus arrhythmia with PVCs that are frequent. Short bursts of A. fib lasting several minutes;; CHA2DS2-VASc Score = 5 (age, Female, PVD, DM, HTN)  . Peripheral vascular disease ([place])   . Sleep apnea    use C-PAP  . Urinary incontinence   . Venous stasis dermatitis of both lower extremities    Past Surgical History:  Procedure Laterality Date  . ANKLE SURGERY Right   . APPLICATION OF WOUND VAC Left 06/27/2015   Procedure: APPLICATION OF WOUND VAC ( POSSIBLE ) ;  Surgeon: [name], MD;  Location: [facility] ORS;  Service: Vascular;  Laterality: Left;  . APPLICATION OF WOUND VAC Left 09/25/2016   Procedure: APPLICATION OF WOUND VAC;  Surgeon: [name], DPM;  Location: [facility] ORS;  Service: Podiatry;  Laterality: Left;  . arm surgery     right    . CHOLECYSTECTOMY    . COLONOSCOPY    .  COLONOSCOPY WITH PROPOFOL N/A 01/11/2017   Procedure: COLONOSCOPY WITH PROPOFOL;  Surgeon: [name], MD;  Location: [facility] ENDOSCOPY;  Service: Endoscopy;  Laterality: N/A;  . COLONOSCOPY WITH PROPOFOL N/A 02/22/2017   Procedure: COLONOSCOPY WITH PROPOFOL;  Surgeon: [name], MD;  Location: [facility] ENDOSCOPY;  Service: Endoscopy;  Laterality: N/A;  . COLONOSCOPY WITH PROPOFOL N/A 05/31/2017   Procedure: COLONOSCOPY WITH PROPOFOL;  Surgeon: [name], MD;  Location: [facility] ENDOSCOPY;   Service: Endoscopy;  Laterality: N/A;  . ESOPHAGOGASTRODUODENOSCOPY (EGD) WITH PROPOFOL N/A 01/11/2017   Procedure: ESOPHAGOGASTRODUODENOSCOPY (EGD) WITH PROPOFOL;  Surgeon: [name], MD;  Location: [facility] ENDOSCOPY;  Service: Endoscopy;  Laterality: N/A;  . ESOPHAGOGASTRODUODENOSCOPY (EGD) WITH PROPOFOL N/A 10/25/2018   Procedure: ESOPHAGOGASTRODUODENOSCOPY (EGD) WITH PROPOFOL;  Surgeon: [name], MD;  Location: [facility] ENDOSCOPY;  Service: Endoscopy;  Laterality: N/A;  . ESOPHAGOGASTRODUODENOSCOPY (EGD) WITH PROPOFOL N/A 11/29/2019   Procedure: ESOPHAGOGASTRODUODENOSCOPY (EGD) WITH PROPOFOL;  Surgeon: Toledo, Benay Pike, MD;  Location: [facility] ENDOSCOPY;  Service: Gastroenterology;  Laterality: N/A;  . HERNIA REPAIR     umbilical  . HIATAL HERNIA REPAIR    . I & D EXTREMITY Left 06/27/2015   Procedure: IRRIGATION AND DEBRIDEMENT EXTREMITY            ( CALF HEMATOMA ) POSSIBLE WOUND VAC;  Surgeon: [name], MD;  Location: [facility] ORS;  Service: Vascular;  Laterality: Left;  . INCISION AND DRAINAGE OF WOUND Left 09/25/2016   Procedure: IRRIGATION AND DEBRIDEMENT - PARTIAL RESECTION OF ACHILLES TENDON WITH WOUND VAC APPLICATION;  Surgeon: [name], DPM;  Location: [facility] ORS;  Service: Podiatry;  Laterality: Left;  . INCISION AND DRAINAGE OF WOUND Left 11/09/2018   Procedure: Excision of left foot wound with ACell placement;  Surgeon: Wallace Going, DO;  Location: [place];  Service: Plastics;  Laterality: Left;  . IRRIGATION AND DEBRIDEMENT ABSCESS Left 09/07/2016   Procedure: IRRIGATION AND DEBRIDEMENT ABSCESS LEFT HEEL;  Surgeon: [name], DPM;  Location: [facility] ORS;  Service: Podiatry;  Laterality: Left;  . JOINT REPLACEMENT  2013   left knee replacement  . KIDNEY SURGERY Right    kidney stones  . LITHOTRIPSY    . LOWER EXTREMITY ANGIOGRAPHY Left 04/04/2018   Procedure: LOWER EXTREMITY ANGIOGRAPHY;  Surgeon: [name], MD;  Location: [place] [facility];  Service:  Cardiovascular;  Laterality: Left;  . NM GATED MYOVIEW ([facility])  February 2017   Likely breast attenuation. LOW RISK study. Normal EF 55-60%.  . OTHER SURGICAL HISTORY     08/2016 or 09/2016 surgery on achilles tenso h/o staph infection heal. Dr. [name]  . removal of left hematoma Left    leg  . REVERSE SHOULDER ARTHROPLASTY Right 10/08/2015   Procedure: REVERSE SHOULDER ARTHROPLASTY;  Surgeon: [name], MD;  Location: [facility] ORS;  Service: Orthopedics;  Laterality: Right;  . SLEEVE GASTROPLASTY    . TONSILLECTOMY    . TOTAL KNEE ARTHROPLASTY Left   . TOTAL SHOULDER REPLACEMENT Right   . TRANSESOPHAGEAL ECHOCARDIOGRAM  08/08/2013   Mild LVH, EF 60-65%. Moderate LA dilation and mild RA dilation. Mild MR with no evidence of stenosis and no evidence of endocarditis. A false chordae is noted.  . TRANSTHORACIC ECHOCARDIOGRAM  08/03/2013   Mild-Moderate concentric LVH, EF 60-65%. Normal diastolic function. Mild LA dilation. Mild MR with possible vegetation  - not confirmed on TEE   . ULNAR NERVE TRANSPOSITION Right 08/06/2016   Procedure: ULNAR NERVE DECOMPRESSION/TRANSPOSITION;  Surgeon:  Poggi, Marshall Cork, MD;  Location: [facility] ORS;  Service: Orthopedics;  Laterality: Right;  . UPPER GI ENDOSCOPY    . VAGINAL HYSTERECTOMY     Social History:  reports that she has never smoked. She has never used smokeless tobacco. She reports that she does not drink alcohol and does not use drugs.  Allergies  Allergen Reactions  . Morphine And Related Other (See Comments) and Nausea Only    Patient becomes very confused Other reaction(s): Other (See Comments), Other (See Comments) Patient becomes very confused Patient becomes very confused   . Penicillins Hives, Shortness Of Breath, Swelling and Other (See Comments)    Facial swelling Has patient had a PCN reaction causing immediate rash, facial/tongue/throat swelling, SOB or lightheadedness with hypotension: Yes Has patient had a PCN reaction causing severe  rash involving mucus membranes or skin necrosis: Yes Has patient had a PCN reaction that required hospitalization No Has patient had a PCN reaction occurring within the last 10 years: No If all of the above answers are "NO", then may proceed with Cephalosporin use.  Other reaction(s): Other (See Comments), Other (See Comments) Facial swelling Has patient had a PCN reaction causing immediate rash, facial/tongue/throat swelling, SOB or lightheadedness with hypotension: Yes Has patient had a PCN reaction causing severe rash involving mucus membranes or skin necrosis: Yes Has patient had a PCN reaction that required hospitalization No Has patient had a PCN reaction occurring within the last 10 years: No If all of the above answers are "NO", then may proceed with Cephalosporin use. Facial swelling Has patient had a PCN reaction causing immediate rash, facial/tongue/throat swelling, SOB or lightheadedness with hypotension: Yes Has patient had a PCN reaction causing severe rash involving mucus membranes or skin necrosis: Yes Has patient had a PCN reaction that required hospitalization No Has patient had a PCN reaction occurring within the last 10 years: No If all of the above answers are "NO", then may proceed with Cephalosporin use. Facial swelling Has patient had a PCN reaction causing immediate rash, facial/tongue/throat swelling, SOB or lightheadedness with hypotension: Yes Has patient had a PCN reaction causing severe rash involving mucus membranes or skin necrosis: Yes Has patient had a PCN reaction that required hospitalization No Has patient had a PCN reaction occurring within the last 10 years: No If all of the above answers are "NO", then may proceed with Cephalosporin use.   . Ambien [Zolpidem]     Hallucination   . Aspirin Hives  . Oxytetracycline Hives    Other reaction(s): Unknown    Family History  Problem Relation Age of Onset  . Skin cancer Father   . Diabetes Father   .  Hypertension Father   . Peripheral vascular disease Father   . Cancer Father   . Cerebral aneurysm Father   . Alcohol abuse Father   . Varicose Veins Mother   . Kidney disease Mother   . Arthritis Mother   . Mental illness Sister   . Cancer Maternal Aunt        breast  . Breast cancer Maternal Aunt   . Arthritis Maternal Grandmother   . Hypertension Maternal Grandmother   . Diabetes Maternal Grandmother   . Arthritis Maternal Grandfather   . Heart disease Maternal Grandfather   . Hypertension Maternal Grandfather   . Arthritis Paternal Grandmother   . Hypertension Paternal Grandmother   . Diabetes Paternal Grandmother   . Arthritis Paternal Grandfather   . Hypertension Paternal Grandfather   . Bladder  Cancer Neg Hx   . Kidney cancer Neg Hx    Family history: Family history reviewed and not pertinent  Prior to Admission medications   Medication Sig Start Date End Date Taking? Authorizing Provider  acetaminophen (TYLENOL) 325 MG tablet Take 2 tablets (650 mg total) by mouth every 6 (six) hours as needed for mild pain or moderate pain (or Fever >/= 101). 09/08/19   Guilford Shi, MD  albuterol (PROVENTIL) (2.5 MG/3ML) 0.083% nebulizer solution Take 2.5 mg by nebulization every 4 (four) hours as needed for wheezing or shortness of breath.    [provider]  ALPRAZolam Duanne Moron) 0.5 MG tablet Take 0.5 mg by mouth at bedtime as needed for anxiety or sleep.    [provider]  amLODipine (NORVASC) 5 MG tablet Take 1 tablet (5 mg total) by mouth daily. 11/28/19 02/26/20  Loel Dubonnet, NP  apixaban (ELIQUIS) 5 MG TABS tablet Take 1 tablet (5 mg total) by mouth 2 (two) times daily. 01/30/20   Loel Dubonnet, NP  buPROPion (WELLBUTRIN XL) 150 MG 24 hr tablet Take 1 tablet (150 mg total) by mouth daily. 05/02/19   [name], [name], MD  calcium-vitamin D (OSCAL WITH D) 500-200 MG-UNIT tablet Take 1 tablet by mouth 2 (two) times daily with a meal. 02/10/20    Nicole Kindred A, DO  cholecalciferol (VITAMIN D) 25 MCG tablet Take 1 tablet (1,000 Units total) by mouth 2 (two) times daily with a meal. 02/10/20   Nicole Kindred A, DO  clobetasol cream (TEMOVATE) 0.76 % Apply 1 application topically 2 (two) times daily as needed (skin irritation).     [provider]  colchicine 0.6 MG tablet Take 0.6 mg by mouth 2 (two) times daily as needed (acute gout flare).     [provider]  diclofenac Sodium (VOLTAREN) 1 % GEL Apply 2 g topically 3 (three) times daily. 02/10/20   Ezekiel Slocumb, DO  DULoxetine (CYMBALTA) 60 MG capsule Take 1 capsule (60 mg total) by mouth daily. 02/19/20 05/19/20  Milinda Pointer, MD  ergocalciferol (VITAMIN D2) 1.25 MG (50000 UT) capsule Take 50,000 Units by mouth once a week.    [provider]  estradiol (ESTRACE) 0.1 MG/GM vaginal cream Place 1 Applicatorful vaginally every Monday, Wednesday, and Friday. Apply 0.5mg  (pea-sized amount)  just inside the vaginal introitus with a finger-tip on Monday, Wednesday and Friday nights, 02/01/19   Zara Council A, PA-C  fluticasone-salmeterol (ADVAIR HFA) 115-21 MCG/ACT inhaler Inhale 2 puffs into the lungs daily. Rinse mouth thoroughly after use 08/16/19   Tyler Pita, MD  folic acid (FOLVITE) 1 MG tablet Take 1 tablet (1 mg total) by mouth daily. 02/11/20   Ezekiel Slocumb, DO  HYDROcodone-acetaminophen (NORCO/VICODIN) 5-325 MG tablet Take 1 tablet by mouth every 8 (eight) hours as needed for severe pain. Must last 30 days 02/19/20 03/20/20  Milinda Pointer, MD  HYDROcodone-acetaminophen (NORCO/VICODIN) 5-325 MG tablet Take 1 tablet by mouth every 8 (eight) hours as needed for severe pain. Must last 30 days 03/20/20 04/19/20  Milinda Pointer, MD  HYDROcodone-acetaminophen (NORCO/VICODIN) 5-325 MG tablet Take 1 tablet by mouth every 8 (eight) hours as needed for severe pain. Must last 30 days 04/19/20 05/19/20  Milinda Pointer, MD   levocetirizine (XYZAL) 5 MG tablet Take 0.5 tablets (2.5 mg total) by mouth every evening. Patient taking differently: Take 5 mg by mouth at bedtime as needed for allergies. 09/07/19   [name], [name], MD  levothyroxine (SYNTHROID) 175  MCG tablet Take 1 tablet (175 mcg total) by mouth daily before breakfast. 02/27/20   [name], [name], MD  losartan-hydrochlorothiazide (HYZAAR) 50-12.5 MG tablet Take 1 tablet by mouth daily.    [provider]  metFORMIN (GLUCOPHAGE) 500 MG tablet Take 1 tablet (500 mg total) by mouth daily with breakfast. With food 02/12/20   [name], [name], MD  metoprolol succinate (TOPROL-XL) 50 MG 24 hr tablet TAKE 1 TABLET BY MOUTH DAILY Patient taking differently: Take 50 mg by mouth daily. 11/09/19   Loel Dubonnet, NP  Misc. Devices (TUB TRANSFER BOARD) MISC 1 Device by Does not apply route as needed. 02/14/20   [name], [name], MD  montelukast (SINGULAIR) 10 MG tablet Take 1 tablet (10 mg total) by mouth at bedtime. 02/27/20   [name], [name], MD  Multiple Vitamin (MULTIVITAMIN WITH MINERALS) TABS tablet Take 1 tablet by mouth daily. 02/11/20   Nicole Kindred A, DO  ondansetron (ZOFRAN) 4 MG tablet Take 1 tablet (4 mg total) by mouth every 8 (eight) hours as needed for nausea or vomiting. 02/14/19   [name], [name], MD  oxybutynin (DITROPAN-XL) 10 MG 24 hr tablet Take 10 mg by mouth at bedtime.    [provider]  pantoprazole (PROTONIX) 40 MG tablet Take 40 mg by mouth daily.  04/07/19 04/06/20  [provider]  pregabalin (LYRICA) 50 MG capsule Take 1 capsule (50 mg total) by mouth 3 (three) times daily. 02/19/20 05/19/20  Milinda Pointer, MD  rosuvastatin (CRESTOR) 20 MG tablet Take 1 tablet (20 mg total) by mouth daily. 01/30/20 07/28/20  Loel Dubonnet, NP  sitaGLIPtin (JANUVIA) 25 MG tablet Take 1 tablet (25 mg total) by mouth daily. 12/26/19   [name], [name], MD  sucralfate  (CARAFATE) 1 GM/10ML suspension Take 1 g by mouth 3 (three) times daily.  12/06/19   [provider]  thiamine 100 MG tablet Take 0.5 tablets (50 mg total) by mouth 2 (two) times daily with a meal. 02/10/20   Nicole Kindred A, DO  TOVIAZ 8 MG TB24 tablet TAKE ONE TABLET BY MOUTH EVERY DAY Patient taking differently: Take 8 mg by mouth daily. 08/07/19   McGowan, Hunt Oris, PA-C  trospium (SANCTURA) 20 MG tablet Take 20 mg by mouth 2 (two) times daily.    [provider]  vitamin B-12 1000 MCG tablet Take 1 tablet (1,000 mcg total) by mouth daily. 02/13/20   Ezekiel Slocumb, DO   Physical Exam: Vitals:   04/06/20 1543 04/06/20 1717 04/06/20 1830 04/06/20 1833  BP: 127/70 127/72 126/75 134/70  Pulse: 92 60  69  Resp: 19 18  18   Temp:      TempSrc:      SpO2: 94% 93%  94%   Constitutional: appears age-appropriate, NAD, calm, comfortable Eyes: PERRL, lids and conjunctivae normal ENMT: Mucous membranes are moist. Posterior pharynx clear of any exudate or lesions. Age-appropriate dentition. Hearing appropriate Neck: normal, supple, no masses, no thyromegaly Respiratory: clear to auscultation bilaterally, no wheezing, no crackles. Normal respiratory effort. No accessory muscle use.  Cardiovascular: Regular rate and rhythm, no murmurs / rubs / gallops. No extremity edema. 2+ pedal pulses. No carotid bruits.  Abdomen: Obese abdomen no tenderness, no masses palpated, no hepatosplenomegaly. Bowel sounds positive.  Musculoskeletal: no clubbing / cyanosis. No joint deformity upper and lower extremities. Good ROM, no contractures, no atrophy. Normal muscle tone.  Skin: no rashes, lesions, ulcers. No induration, Reynauds on right distal fingertips Neurologic: Sensation  intact. Strength 5/5 in all 4.  Psychiatric: Normal judgment and insight. Alert and oriented x 3. Normal mood.   Chest x-ray on Admission: I personally reviewed and I agree with radiologist reading as below.  DG Chest  2 View  Result Date: 04/05/2020 CLINICAL DATA:  Hypoxia. EXAM: CHEST - 2 VIEW COMPARISON:  02/05/2020 FINDINGS: Heart size remains within normal limits. Low lung volumes are again seen with stable elevation right hemidiaphragm and right lower lobe scarring. No evidence of pulmonary consolidation or pleural effusion. Right shoulder prosthesis again seen. IMPRESSION: Stable low lung volumes and right lower lobe scarring. No acute findings. Electronically Signed   By: Marlaine Hind M.D.   On: 04/05/2020 12:06   MR Brain Wo Contrast  Result Date: 04/05/2020 CLINICAL DATA:  Stroke.  Bilateral leg weakness. EXAM: MRI HEAD WITHOUT CONTRAST TECHNIQUE: Multiplanar, multiecho pulse sequences of the brain and surrounding structures were obtained without intravenous contrast. COMPARISON:  02/05/2020 FINDINGS: Brain: Diffusion imaging shows a punctate focus of restricted diffusion in the right external capsule region consistent with acute small vessel infarction. There are a few smudgy areas of restricted diffusion in the right side of the splenium of the corpus callosum that could also represent small vessel infarctions. The differential diagnosis does include a demyelinating process. Elsewhere, chronic abnormal T2 and FLAIR signal within the deep and subcortical hemispheric white matter appears unchanged and most consistent with chronic small vessel disease. Coexistence demyelinating disease is not excluded. Right posterior parietal cortical and subcortical infarction is unchanged. Numerous dilated perivascular spaces throughout the basal ganglia regions. Few old small vessel insults also affect the thalami and brainstem. Previously seen acute infarctions have undergone expected evolutionary change with loss restricted diffusion. No evidence of mass, hemorrhage, hydrocephalus or extra-axial collection. Vascular: Major vessels at the base of the brain show flow. Skull and upper cervical spine: Negative Sinuses/Orbits:  Clear/normal Other: None IMPRESSION: 1. Punctate focus of restricted diffusion in the right external capsule region consistent with acute small vessel infarction. Few smudgy areas of restricted diffusion in the right side of the splenium of the corpus callosum that could also represent small vessel infarctions. The differential diagnosis does include demyelinating disease, but that is felt less likely. 2. Chronic small-vessel ischemic changes elsewhere throughout the brain as outlined above. Electronically Signed   By: Nelson Chimes M.D.   On: 04/05/2020 12:04   DG Toe 2nd Left  Result Date: 04/05/2020 CLINICAL DATA:  Left second toe gangrene. EXAM: LEFT SECOND TOE COMPARISON:  December 19, 2019. FINDINGS: Stable old fractures are seen involving the second and third proximal phalanges as well as probable severe degenerative changes seen involving the first metatarsophalangeal joint. No acute fracture or lytic destruction is noted. Soft tissues are unremarkable. IMPRESSION: No acute fracture or lytic destruction is noted to suggest osteomyelitis. Stable old fractures are seen involving the second and third proximal phalanges. Electronically Signed   By: Marijo Conception M.D.   On: 04/05/2020 14:49   Labs on Admission: I have personally reviewed following labs  CBC: Recent Labs  Lab 04/05/20 1215  WBC 7.1  NEUTROABS 5.4  HGB 10.0*  HCT 32.9*  MCV 86.8  PLT [id number]   Basic Metabolic Panel: Recent Labs  Lab 04/05/20 1215 04/06/20 1627  NA 142 140  K 4.7 4.8  CL 104 102  CO2 26 27  GLUCOSE 102* 107*  BUN 25* 25*  CREATININE 1.13* 1.11*  CALCIUM 9.1 9.1   GFR: Estimated Creatinine Clearance (by C-G  formula based on SCr of 1.11 mg/dL (H)) Female: 55.2 mL/min (A) Female: 67.3 mL/min (A) Liver Function Tests: Recent Labs  Lab 04/05/20 1215  AST 18  ALT 14  ALKPHOS 88  BILITOT 0.5  PROT 7.4  ALBUMIN 3.3*   Coagulation Profile: Recent Labs  Lab 04/06/20 1639  INR 1.6*   Anemia  Panel: Recent Labs    04/05/20 1215  VITAMINB12 947*  FOLATE >100.0  FERRITIN 45  TIBC 396  IRON 34   Urine analysis:    Component Value Date/Time   COLORURINE YELLOW (A) 02/04/2020 1537   APPEARANCEUR CLOUDY (A) 02/04/2020 1537   APPEARANCEUR Clear 09/18/2019 1437   LABSPEC 1.011 02/04/2020 1537   PHURINE 7.0 02/04/2020 1537   GLUCOSEU NEGATIVE 02/04/2020 1537   GLUCOSEU NEGATIVE 08/02/2017 1427   HGBUR SMALL (A) 02/04/2020 1537   BILIRUBINUR NEGATIVE 02/04/2020 1537   BILIRUBINUR Negative 09/18/2019 1437   BILIRUBINUR 1+ 09/15/2017 1319   KETONESUR NEGATIVE 02/04/2020 1537   PROTEINUR NEGATIVE 02/04/2020 1537   UROBILINOGEN 0.2 09/15/2017 1319   UROBILINOGEN 0.2 08/02/2017 1427   NITRITE NEGATIVE 02/04/2020 1537   LEUKOCYTESUR LARGE (A) 02/04/2020 1537   Hendy Brindle N Mieke Brinley D.O. [facility]  If 7PM-7AM, please contact overnight-coverage provider If 7AM-7PM, please contact day coverage provider [email or web address]  04/06/2020, 7:03 PM

## 2020-04-06 NOTE — Telephone Encounter (Signed)
   Patient's husband called because patient had MRI yesterday in the setting of lower extremity weakness which showed:  IMPRESSION: 1. Punctate focus of restricted diffusion in the right external capsule region consistent with acute small vessel infarction. Few smudgy areas of restricted diffusion in the right side of the splenium of the corpus callosum that could also represent small vessel infarctions. The differential diagnosis does include demyelinating disease, but that is felt less likely. 2. Chronic small-vessel ischemic changes elsewhere throughout the brain as outlined above.  They were contacted by primary care provider advised to present to the emergency department for further evaluation of acute stroke.  Patient had question about whether or not patient should be having strokes considering that she is on Eliquis.  We discussed the role of Eliquis and the prevention of cardioembolic strokes and noted that MRI showed small vessel infarcts and small vessel ischemic changes throughout, suggestive of atherosclerotic disease.  Caller verbalized understanding and was grateful for the call back.  Murray Hodgkins, NP 04/06/2020, 11:03 AM

## 2020-04-06 NOTE — ED Triage Notes (Addendum)
Patient states went to her PCP on the 13th and a MRI was ordered due to weakness to legs. Patient received phone call yesterday to come over to the ED for admission due to MRI results.   Patient has a Rad[facility] practitioner bed waiting for her, will be available for her in 3 days.   Per provider note: Pt had MRI today [place] +new stroke as was told to go to the ED for admission for [facility] for PT/OT as she is bedbound confined to home and limited transportation due to mobility and husband unable to mobilize her at home

## 2020-04-06 NOTE — ED Notes (Signed)
Took over care of pt. Pt resting comfortably. Pt in NAD at this time. VSS. Awaiting further orders. Will continue to monitor.  

## 2020-04-06 NOTE — ED Notes (Signed)
Report given to [facility], [place]. Will move pt when room is clean. Pt sleeping and in NAD at this time.

## 2020-04-06 NOTE — ED Notes (Signed)
Per verbal bedside order by Cox, MD pt okay to have food. MD informed pt already passed swallow screen which is charted. Pt in NAD at this time. VSS. Awaiting further orders. Will continue to monitor.

## 2020-04-06 NOTE — ED Provider Notes (Signed)
[facility] Emergency Department Provider Note ____________________________________________   Event Date/Time   First MD Initiated Contact with Patient 04/06/20 1613     (approximate)  I have reviewed the triage vital signs and the nursing notes.  HISTORY  Chief Complaint Weakness   HPI [name] is a 70 y.o. adultwho presents to the ED for evaluation of weakness and abnormal MRI.  Chart review indicates 1/13 PCP visit due to worsening home weakness despite PT/OT, causing her to be bedbound.  The plan is for her to return to [facility].  History of stroke with right leg deficits, she presented with acutely worsening left sided weakness to her PCP, outpatient MRI was performed yesterday, returning positive with evidence of acute stroke to the right sided external capsule.  Outpatient blood work performed yesterday shows slight progression of her CKD.   Patient with receiving the phone call about her abnormal MRI last night, but not coming to the ED because she was already comfortable at home.  She presents today for an expected medical admission prior to return to [facility] for further PT/OT.  Patient reports that she was able to get up and walk with a walker quite well after being discharged from [facility] about 1 month ago.  She reports that her last stroke caused diffuse left arm and leg weakness, but she had improved to the point where she only had distal extremity weakness, and had improving proximal left arm weakness.  She now reports that her proximal left arm and left side and weakened again.  She denies any falls or trauma.  Denies fever, abdominal pain, emesis, cough, diarrhea.  Past Medical History:  Diagnosis Date  . Abnormal antibody titer   . Anxiety and depression   . Arthritis   . Asthma   . Cystocele   . Depression   . Diabetes ([place])   . DVT (deep venous thrombosis) (HCC)    Righ calf  . Dyspnea    11/08/2018 -  "more now due to lack of exercise"  . Dysrhythmia    afib  . Edema   . GERD (gastroesophageal reflux disease)   . Heart murmur    Echocardiogram June 2015: Mild MR (possible vegetation seen on TTE, not seen on TEE), normal LV size with moderate concentric LVH. Normal function EF 60-65%. Normal diastolic function. Mild LA dilation.  Marland Kitchen History of IBS   . History of kidney stones   . History of methicillin resistant staphylococcus aureus (MRSA) 2008  . Hyperlipidemia   . Hypertension   . Hypothyroidism   . Insomnia   . Kidney stone    kidney stones with lithotripsy .  [facility]- "adrenal glands"  . Neuropathy involving both lower extremities   . Obesity, Class II, BMI 35-39.9, with comorbidity   . Paroxysmal atrial fibrillation ([place]) 2007   In June 2015, Cardiac Event Monitor: Mostly SR/sinus arrhythmia with PVCs that are frequent. Short bursts of A. fib lasting several minutes;; CHA2DS2-VASc Score = 5 (age, Female, PVD, DM, HTN)  . Peripheral vascular disease ([place])   . Sleep apnea    use C-PAP  . Urinary incontinence   . Venous stasis dermatitis of both lower extremities     Patient Active Problem List   Diagnosis Date Noted  . Persistent atrial fibrillation ([place]) 04/04/2020  . Bedbound 03/29/2020  . B12 deficiency 03/11/2020  . Ulcer of left heel and midfoot ([place]) 03/11/2020  . PAD (peripheral artery disease) ([place])  03/11/2020  . Rotator cuff disorder, left 03/01/2020  . Depression, recurrent ([place]) 03/01/2020  . Cerebrovascular accident (CVA) ([place]) 03/01/2020  . Primary osteoarthritis of left shoulder 02/12/2020  . Folate deficiency 02/08/2020  . Vitamin D deficiency 02/08/2020  . Malnutrition of moderate degree 02/06/2020  . Stroke ([place]) 02/05/2020  . Left arm pain 01/03/2020  . Left hemiparesis ([place]) 12/28/2019  . Hypertension associated with diabetes ([place]) 12/26/2019  . Acute embolic stroke ([place]) [id number]  . Chronic bacteriuria 11/17/2019  . Hyperkalemia  09/14/2019  . Bacteremia due to Gram-positive bacteria 09/08/2019  . Pressure injury of skin 09/04/2019  . Elevated troponin 09/02/2019  . COPD with acute exacerbation ([place]) 09/02/2019  . Acute respiratory failure with hypoxia ([place]) 09/02/2019  . Pharmacologic therapy 08/30/2019  . Recurrent falls 07/10/2019  . Left ankle pain 06/02/2019  . Chronic pain of right ankle 02/14/2019  . Arthritis 02/14/2019  . Nausea and vomiting 02/14/2019  . Meralgia paresthetica of right side 12/05/2018  . Chronic anticoagulation ([place]) 12/05/2018  . Peripheral vascular disease ([place]) 10/18/2018  . Chronic ulcer of right leg ([place]) 10/18/2018  . Fibromyalgia 08/17/2018  . Chronic musculoskeletal pain 08/17/2018  . Tremor of right hand 08/03/2018  . Overactive bladder 08/03/2018  . Physical deconditioning 08/03/2018  . Foot ulcer, left ([place]) 03/09/2018  . Adrenal mass ([place]) 08/02/2017  . Eczema 06/27/2017  . Diabetic ulcer of left foot ([place]) 06/24/2017  . Mass of both adrenal glands ([place]) 05/16/2017  . Fatty liver 05/13/2017  . Strain of right hip 04/30/2017  . Ischial pain, right 04/30/2017  . Pain of left hip joint 04/22/2017  . Abnormality of gait and mobility 04/22/2017  . Muscle strain of left hip 04/09/2017  . Anemia 11/18/2016  . Asthma 11/18/2016  . Carpal tunnel syndrome 11/18/2016  . Pain in joint involving ankle and foot 11/18/2016  . Obstructive sleep apnea of adult 11/18/2016  . Spinal stenosis of thoracic region 11/18/2016  . Abscess of tendon of left foot 09/21/2016  . Cubital tunnel syndrome on right 08/07/2016  . Chronic pain syndrome 02/25/2016  . Long term current use of opiate analgesic 02/25/2016  . Chronic right shoulder pain 02/25/2016  . Status post reverse total shoulder replacement 10/08/2015  . 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 Morbid obesity with BMI of 40.0-44.9, adult ([place]) 09/23/2015  . Chronic prescription benzodiazepine use  09/23/2015  . Arthralgia 09/23/2015  . Osteoarthritis involving multiple joints 09/23/2015  . Other shoulder lesions (Right) 08/28/2015  . Cervical radiculitis 08/06/2015  . Fall 05/22/2015  . Opiate use (15 MME/Day) 01/16/2015  . Impingement syndrome of shoulder region 01/07/2015  . Mixed incontinence 11/26/2014  . Atrophic vaginitis 11/26/2014  . Anxiety and depression 09/21/2014  . Bladder cystocele 09/21/2014  . Gastro-esophageal reflux disease without esophagitis 09/21/2014  . DM (diabetes mellitus) type II, controlled, with peripheral vascular disorder ([place]) 08/31/2014  . Diabetic peripheral neuropathy associated with type 2 diabetes mellitus ([place]) 08/31/2014  . Asthma, well controlled 08/31/2014  . Hypothyroidism, unspecified 08/31/2014  . Peripheral vascular disease due to secondary diabetes mellitus ([place]) 12/11/2013  . OSA on CPAP 10/19/2013  . Essential hypertension 07/27/2013  . Chronic atrial fibrillation 08/10/2012    Past Surgical History:  Procedure Laterality Date  . ANKLE SURGERY Right   . APPLICATION OF WOUND VAC Left 06/27/2015   Procedure: APPLICATION OF WOUND VAC ( POSSIBLE ) ;  Surgeon: [name], MD;  Location: [facility] ORS;  Service: Vascular;  Laterality:  Left;  . APPLICATION OF WOUND VAC Left 09/25/2016   Procedure: APPLICATION OF WOUND VAC;  Surgeon: [name], DPM;  Location: [facility] ORS;  Service: Podiatry;  Laterality: Left;  . arm surgery     right    . CHOLECYSTECTOMY    . COLONOSCOPY    . COLONOSCOPY WITH PROPOFOL N/A 01/11/2017   Procedure: COLONOSCOPY WITH PROPOFOL;  Surgeon: [name], MD;  Location: [facility] ENDOSCOPY;  Service: Endoscopy;  Laterality: N/A;  . COLONOSCOPY WITH PROPOFOL N/A 02/22/2017   Procedure: COLONOSCOPY WITH PROPOFOL;  Surgeon: [name], MD;  Location: [facility] ENDOSCOPY;  Service: Endoscopy;  Laterality: N/A;  . COLONOSCOPY WITH PROPOFOL N/A 05/31/2017   Procedure: COLONOSCOPY WITH PROPOFOL;  Surgeon: [name], MD;  Location: [facility] ENDOSCOPY;  Service: Endoscopy;  Laterality: N/A;  . ESOPHAGOGASTRODUODENOSCOPY (EGD) WITH PROPOFOL N/A 01/11/2017   Procedure: ESOPHAGOGASTRODUODENOSCOPY (EGD) WITH PROPOFOL;  Surgeon: [name], MD;  Location: [facility] ENDOSCOPY;  Service: Endoscopy;  Laterality: N/A;  . ESOPHAGOGASTRODUODENOSCOPY (EGD) WITH PROPOFOL N/A 10/25/2018   Procedure: ESOPHAGOGASTRODUODENOSCOPY (EGD) WITH PROPOFOL;  Surgeon: [name], MD;  Location: [facility] ENDOSCOPY;  Service: Endoscopy;  Laterality: N/A;  . ESOPHAGOGASTRODUODENOSCOPY (EGD) WITH PROPOFOL N/A 11/29/2019   Procedure: ESOPHAGOGASTRODUODENOSCOPY (EGD) WITH PROPOFOL;  Surgeon: Toledo, Benay Pike, MD;  Location: [facility] ENDOSCOPY;  Service: Gastroenterology;  Laterality: N/A;  . HERNIA REPAIR     umbilical  . HIATAL HERNIA REPAIR    . I & D EXTREMITY Left 06/27/2015   Procedure: IRRIGATION AND DEBRIDEMENT EXTREMITY            ( CALF HEMATOMA ) POSSIBLE WOUND VAC;  Surgeon: [name], MD;  Location: [facility] ORS;  Service: Vascular;  Laterality: Left;  . INCISION AND DRAINAGE OF WOUND Left 09/25/2016   Procedure: IRRIGATION AND DEBRIDEMENT - PARTIAL RESECTION OF ACHILLES TENDON WITH WOUND VAC APPLICATION;  Surgeon: [name], DPM;  Location: [facility] ORS;  Service: Podiatry;  Laterality: Left;  . INCISION AND DRAINAGE OF WOUND Left 11/09/2018   Procedure: Excision of left foot wound with ACell placement;  Surgeon: Wallace Going, DO;  Location: [place];  Service: Plastics;  Laterality: Left;  . IRRIGATION AND DEBRIDEMENT ABSCESS Left 09/07/2016   Procedure: IRRIGATION AND DEBRIDEMENT ABSCESS LEFT HEEL;  Surgeon: [name], DPM;  Location: [facility] ORS;  Service: Podiatry;  Laterality: Left;  . JOINT REPLACEMENT  2013   left knee replacement  . KIDNEY SURGERY Right    kidney stones  . LITHOTRIPSY    . LOWER EXTREMITY ANGIOGRAPHY Left 04/04/2018   Procedure: LOWER EXTREMITY ANGIOGRAPHY;  Surgeon: [name], MD;   Location: [facility] CV LAB;  Service: Cardiovascular;  Laterality: Left;  . NM GATED MYOVIEW ([place] HX)  February 2017   Likely breast attenuation. LOW RISK study. Normal EF 55-60%.  . OTHER SURGICAL HISTORY     08/2016 or 09/2016 surgery on achilles tenso h/o staph infection heal. Dr. [name]  . removal of left hematoma Left    leg  . REVERSE SHOULDER ARTHROPLASTY Right 10/08/2015   Procedure: REVERSE SHOULDER ARTHROPLASTY;  Surgeon: [name], MD;  Location: [facility] ORS;  Service: Orthopedics;  Laterality: Right;  . SLEEVE GASTROPLASTY    . TONSILLECTOMY    . TOTAL KNEE ARTHROPLASTY Left   . TOTAL SHOULDER REPLACEMENT Right   . TRANSESOPHAGEAL ECHOCARDIOGRAM  08/08/2013   Mild LVH, EF 60-65%. Moderate LA dilation and mild RA dilation. Mild MR with no evidence of stenosis and no  evidence of endocarditis. A false chordae is noted.  . TRANSTHORACIC ECHOCARDIOGRAM  08/03/2013   Mild-Moderate concentric LVH, EF 60-65%. Normal diastolic function. Mild LA dilation. Mild MR with possible vegetation  - not confirmed on TEE   . ULNAR NERVE TRANSPOSITION Right 08/06/2016   Procedure: ULNAR NERVE DECOMPRESSION/TRANSPOSITION;  Surgeon: [name], MD;  Location: [facility] ORS;  Service: Orthopedics;  Laterality: Right;  . UPPER GI ENDOSCOPY    . VAGINAL HYSTERECTOMY      Prior to Admission medications   Medication Sig Start Date End Date Taking? Authorizing Provider  acetaminophen (TYLENOL) 325 MG tablet Take 2 tablets (650 mg total) by mouth every 6 (six) hours as needed for mild pain or moderate pain (or Fever >/= 101). 09/08/19   Guilford Shi, MD  albuterol (PROVENTIL) (2.5 MG/3ML) 0.083% nebulizer solution Take 2.5 mg by nebulization every 4 (four) hours as needed for wheezing or shortness of breath.    [provider]  ALPRAZolam Duanne Moron) 0.5 MG tablet Take 0.5 mg by mouth at bedtime as needed for anxiety or sleep.    [provider]  amLODipine (NORVASC) 5 MG tablet Take 1  tablet (5 mg total) by mouth daily. 11/28/19 02/26/20  Loel Dubonnet, NP  apixaban (ELIQUIS) 5 MG TABS tablet Take 1 tablet (5 mg total) by mouth 2 (two) times daily. 01/30/20   Loel Dubonnet, NP  buPROPion (WELLBUTRIN XL) 150 MG 24 hr tablet Take 1 tablet (150 mg total) by mouth daily. 05/02/19   [name], [name], MD  calcium-vitamin D (OSCAL WITH D) 500-200 MG-UNIT tablet Take 1 tablet by mouth 2 (two) times daily with a meal. 02/10/20   Nicole Kindred A, DO  cholecalciferol (VITAMIN D) 25 MCG tablet Take 1 tablet (1,000 Units total) by mouth 2 (two) times daily with a meal. 02/10/20   Nicole Kindred A, DO  clobetasol cream (TEMOVATE) [id number] % Apply 1 application topically 2 (two) times daily as needed (skin irritation).     [provider]  colchicine 0.6 MG tablet Take 0.6 mg by mouth 2 (two) times daily as needed (acute gout flare).     [provider]  diclofenac Sodium (VOLTAREN) 1 % GEL Apply 2 g topically 3 (three) times daily. 02/10/20   Ezekiel Slocumb, DO  DULoxetine (CYMBALTA) 60 MG capsule Take 1 capsule (60 mg total) by mouth daily. 02/19/20 05/19/20  Milinda Pointer, MD  ergocalciferol (VITAMIN D2) 1.25 MG (50000 UT) capsule Take 50,000 Units by mouth once a week.    [provider]  estradiol (ESTRACE) 0.1 MG/GM vaginal cream Place 1 Applicatorful vaginally every Monday, Wednesday, and Friday. Apply 0.5mg  (pea-sized amount)  just inside the vaginal introitus with a finger-tip on Monday, Wednesday and Friday nights, 02/01/19   Zara Council A, PA-C  fluticasone-salmeterol (ADVAIR HFA) 115-21 MCG/ACT inhaler Inhale 2 puffs into the lungs daily. Rinse mouth thoroughly after use 08/16/19   Tyler Pita, MD  folic acid (FOLVITE) 1 MG tablet Take 1 tablet (1 mg total) by mouth daily. 02/11/20   Ezekiel Slocumb, DO  HYDROcodone-acetaminophen (NORCO/VICODIN) 5-325 MG tablet Take 1 tablet by mouth every 8 (eight) hours as needed for severe pain.  Must last 30 days 02/19/20 03/20/20  Milinda Pointer, MD  HYDROcodone-acetaminophen (NORCO/VICODIN) 5-325 MG tablet Take 1 tablet by mouth every 8 (eight) hours as needed for severe pain. Must last 30 days 03/20/20 04/19/20  Milinda Pointer, MD  HYDROcodone-acetaminophen (NORCO/VICODIN) 5-325 MG tablet Take 1 tablet  by mouth every 8 (eight) hours as needed for severe pain. Must last 30 days 04/19/20 05/19/20  Milinda Pointer, MD  levocetirizine (XYZAL) 5 MG tablet Take 0.5 tablets (2.5 mg total) by mouth every evening. Patient taking differently: Take 5 mg by mouth at bedtime as needed for allergies. 09/07/19   [name], [name], MD  levothyroxine (SYNTHROID) 175 MCG tablet Take 1 tablet (175 mcg total) by mouth daily before breakfast. 02/27/20   [name], [name], MD  losartan-hydrochlorothiazide (HYZAAR) 50-12.5 MG tablet Take 1 tablet by mouth daily.    [provider]  metFORMIN (GLUCOPHAGE) 500 MG tablet Take 1 tablet (500 mg total) by mouth daily with breakfast. With food 02/12/20   [name], [name], MD  metoprolol succinate (TOPROL-XL) 50 MG 24 hr tablet TAKE 1 TABLET BY MOUTH DAILY Patient taking differently: Take 50 mg by mouth daily. 11/09/19   Loel Dubonnet, NP  Misc. Devices (TUB TRANSFER BOARD) MISC 1 Device by Does not apply route as needed. 02/14/20   [name], [name], MD  montelukast (SINGULAIR) 10 MG tablet Take 1 tablet (10 mg total) by mouth at bedtime. 02/27/20   [name], [name], MD  Multiple Vitamin (MULTIVITAMIN WITH MINERALS) TABS tablet Take 1 tablet by mouth daily. 02/11/20   Nicole Kindred A, DO  ondansetron (ZOFRAN) 4 MG tablet Take 1 tablet (4 mg total) by mouth every 8 (eight) hours as needed for nausea or vomiting. 02/14/19   [name], [name], MD  oxybutynin (DITROPAN-XL) 10 MG 24 hr tablet Take 10 mg by mouth at bedtime.    [provider]  pantoprazole (PROTONIX) 40 MG tablet Take 40 mg by mouth  daily.  04/07/19 04/06/20  [provider]  pregabalin (LYRICA) 50 MG capsule Take 1 capsule (50 mg total) by mouth 3 (three) times daily. 02/19/20 05/19/20  Milinda Pointer, MD  rosuvastatin (CRESTOR) 20 MG tablet Take 1 tablet (20 mg total) by mouth daily. 01/30/20 07/28/20  Loel Dubonnet, NP  sitaGLIPtin (JANUVIA) 25 MG tablet Take 1 tablet (25 mg total) by mouth daily. 12/26/19   [name], [name], MD  sucralfate (CARAFATE) 1 GM/10ML suspension Take 1 g by mouth 3 (three) times daily.  12/06/19   [provider]  thiamine 100 MG tablet Take 0.5 tablets (50 mg total) by mouth 2 (two) times daily with a meal. 02/10/20   Nicole Kindred A, DO  TOVIAZ 8 MG TB24 tablet TAKE ONE TABLET BY MOUTH EVERY DAY Patient taking differently: Take 8 mg by mouth daily. 08/07/19   McGowan, Hunt Oris, PA-C  trospium (SANCTURA) 20 MG tablet Take 20 mg by mouth 2 (two) times daily.    [provider]  vitamin B-12 1000 MCG tablet Take 1 tablet (1,000 mcg total) by mouth daily. 02/13/20   Ezekiel Slocumb, DO    Allergies Morphine and related, Penicillins, Ambien [zolpidem], Aspirin, and Oxytetracycline  Family History  Problem Relation Age of Onset  . Skin cancer Father   . Diabetes Father   . Hypertension Father   . Peripheral vascular disease Father   . Cancer Father   . Cerebral aneurysm Father   . Alcohol abuse Father   . Varicose Veins Mother   . Kidney disease Mother   . Arthritis Mother   . Mental illness Sister   . Cancer Maternal Aunt        breast  . Breast cancer Maternal Aunt   . Arthritis Maternal Grandmother   . Hypertension Maternal  Grandmother   . Diabetes Maternal Grandmother   . Arthritis Maternal Grandfather   . Heart disease Maternal Grandfather   . Hypertension Maternal Grandfather   . Arthritis Paternal Grandmother   . Hypertension Paternal Grandmother   . Diabetes Paternal Grandmother   . Arthritis Paternal Grandfather   . Hypertension  Paternal Grandfather   . Bladder Cancer Neg Hx   . Kidney cancer Neg Hx     Social History Social History   Tobacco Use  . Smoking status: Never Smoker  . Smokeless tobacco: Never Used  Vaping Use  . Vaping Use: Never used  Substance Use Topics  . Alcohol use: No    Alcohol/week: 0.0 standard drinks  . Drug use: Never    Review of Systems  Constitutional: No fever/chills Eyes: No visual changes. ENT: No sore throat. Cardiovascular: Denies chest pain. Respiratory: Denies shortness of breath. Gastrointestinal: No abdominal pain.  No nausea, no vomiting.  No diarrhea.  No constipation. Genitourinary: Negative for dysuria. Musculoskeletal: Negative for back pain. Skin: Negative for rash. Neurological: Negative for headaches,.  Positive for worsening left arm and leg weakness.  ____________________________________________   PHYSICAL EXAM:  VITAL SIGNS: Vitals:   04/06/20 1258 04/06/20 1543  BP: 116/72 127/70  Pulse: 89 92  Resp: 17 19  Temp: 97.9 F (36.6 C)   SpO2: 94% 94%     Constitutional: Alert and oriented. Well appearing and in no acute distress.  Obese and conversational. Eyes: Conjunctivae are normal. PERRL. EOMI. Head: Atraumatic. Nose: No congestion/rhinnorhea. Mouth/Throat: Mucous membranes are moist.  Oropharynx non-erythematous. Neck: No stridor. No cervical spine tenderness to palpation. Cardiovascular: Normal rate, regular rhythm. Grossly normal heart sounds.  Good peripheral circulation. Respiratory: Normal respiratory effort.  No retractions. Lungs CTAB. Gastrointestinal: Soft , nondistended, nontender to palpation. No CVA tenderness. Musculoskeletal:No joint effusions. No signs of acute trauma.  Mild pitting edema to bilateral lower extremities. Neurologic:  Normal speech and language.  Left arm and leg weakness compared to the right.  Associated dysmetria with FNF on left hand. Cranial nerves intact. Skin:  Skin is warm, dry and intact. No  rash noted. Psychiatric: Mood and affect are normal. Speech and behavior are normal.  ____________________________________________   LABS (all labs ordered are listed, but only abnormal results are displayed)  Labs Reviewed  RESP PANEL BY RT-PCR (FLU A&B, COVID) ARPGX2  BASIC METABOLIC PANEL  ETHANOL  PROTIME-INR  APTT  URINE DRUG SCREEN, QUALITATIVE ([facility] ONLY)  URINALYSIS, ROUTINE W REFLEX MICROSCOPIC   ____________________________________________  12 Lead EKG   ____________________________________________  RADIOLOGY  ED MD interpretation: Outpatient MRI reviewed, per HPI  Official radiology report(s): No results found.  ____________________________________________   PROCEDURES and INTERVENTIONS  Procedure(s) performed (including Critical Care):  .1-3 Lead EKG Interpretation Performed by: Vladimir Crofts, MD Authorized by: Vladimir Crofts, MD     Interpretation: normal     ECG rate:  87   ECG rate assessment: normal     Rhythm: sinus rhythm     Ectopy: none     Conduction: normal      Medications - No data to display  ____________________________________________   MDM / ED COURSE   70 year old woman with history of strokes presents to the ED with worsening left arm weakness after outpatient MRI demonstrates additional acute stroke.  Normal vitals on room air.  Exam demonstrates a reported left arm weakness and left leg weakness, reportedly worsening over the past week.  No evidence of trauma or distress.  Blood work  from yesterday reviewed with slight decrement in renal function, so we will repeat her metabolic panel and admit to medicine for stroke work-up prior to returning to SNF.      ____________________________________________   FINAL CLINICAL IMPRESSION(S) / ED DIAGNOSES  Final diagnoses:  Cerebrovascular accident (CVA) due to other mechanism [facility])  Left arm weakness     ED Discharge Orders    None       Trinton Prewitt   Note:  This  document was prepared using Dragon voice recognition software and may include unintentional dictation errors.   Vladimir Crofts, MD 04/06/20 [phone]

## 2020-04-06 NOTE — Consult Note (Signed)
NEURO HOSPITALIST CONSULT NOTE   Requestig physician: Dr. Tobie Poet  Reason for Consult: Acute strokes seen on MRI  History obtained from:  Patient and Chart     HPI:                                                                                                                                          [name] is an 70 y.o. adult female with a PMHx of arthritis, anxiety, depression, asthma, DM, DVT of right calf, dyspnea, paroxysmal atrial fibrillation (on apixaban), IBS, nephrolithiasis, HLD, HTN, hypothyroidism, neuropathy, PVD and sleep apnea presenting to the ED after she received a call yesterday to be evaluated due to outpatient MRI brain revealing acute to subacute punctate right cerebral hemisphere ischemic infarctions. At baseline she is bedbound and confined to her home; with husband unable to mobilize her.  She initially presented to her PCP with worsening of left sided weakness. An MRI was performed yesterday (Friday), which came back positive with acute small strokes involving the right corpus callosum and right external capsule. Blood work also showed slight progression of her CKD.   Exam by Hospitalist revealed that she has difficulty moving left lower extremity. Also with Reynauds to her right finger tip.  She has a history of prior stroke with right leg deficits. She had a stroke in 12/2019 and new stroke in 01/2020 (3 mm acute cortical infarct within the posterior left frontal lobe. 2 mm acute infarct within the left frontal lobe periventricular white matter and chronic small-vessel infarct within the left centrum semiovale, new as compared to MRI of 12/22/2019). Also, in October, she experienced double vision that then went away. Prior to 12/2019 she was on Pradaxa and then transitioned to Eliquis bid and  ASA.   Additional history obtained by EDP has been revieiwed: "Patient reports that she was able to get up and walk with a walker quite well after being  discharged from [facility] about 1 month ago.  She reports that her last stroke caused diffuse left arm and leg weakness, but she had improved to the point where she only had distal extremity weakness, and had improving proximal left arm weakness.  She now reports that her proximal left arm and left side and weakened again.  She denies any falls or trauma.  Denies fever, abdominal pain, emesis, cough, diarrhea."   Past Medical History:  Diagnosis Date  . Abnormal antibody titer   . Anxiety and depression   . Arthritis   . Asthma   . Cystocele   . Depression   . Diabetes ([place])   . DVT (deep venous thrombosis) ([facility])    Righ calf  . Dyspnea    11/08/2018 - "more now due to lack of exercise"  .  Dysrhythmia    afib  . Edema   . GERD (gastroesophageal reflux disease)   . Heart murmur    Echocardiogram June 2015: Mild MR (possible vegetation seen on TTE, not seen on TEE), normal LV size with moderate concentric LVH. Normal function EF 60-65%. Normal diastolic function. Mild LA dilation.  Marland Kitchen History of IBS   . History of kidney stones   . History of methicillin resistant staphylococcus aureus (MRSA) 2008  . Hyperlipidemia   . Hypertension   . Hypothyroidism   . Insomnia   . Kidney stone    kidney stones with lithotripsy .  [facility]- "adrenal glands"  . Neuropathy involving both lower extremities   . Obesity, Class II, BMI 35-39.9, with comorbidity   . Paroxysmal atrial fibrillation ([place]) 2007   In June 2015, Cardiac Event Monitor: Mostly SR/sinus arrhythmia with PVCs that are frequent. Short bursts of A. fib lasting several minutes;; CHA2DS2-VASc Score = 5 (age, Female, PVD, DM, HTN)  . Peripheral vascular disease ([place])   . Sleep apnea    use C-PAP  . Urinary incontinence   . Venous stasis dermatitis of both lower extremities     Past Surgical History:  Procedure Laterality Date  . ANKLE SURGERY Right   . APPLICATION OF WOUND VAC Left 06/27/2015   Procedure:  APPLICATION OF WOUND VAC ( POSSIBLE ) ;  Surgeon: [name], MD;  Location: [facility] ORS;  Service: Vascular;  Laterality: Left;  . APPLICATION OF WOUND VAC Left 09/25/2016   Procedure: APPLICATION OF WOUND VAC;  Surgeon: [name], DPM;  Location: [facility] ORS;  Service: Podiatry;  Laterality: Left;  . arm surgery     right    . CHOLECYSTECTOMY    . COLONOSCOPY    . COLONOSCOPY WITH PROPOFOL N/A 01/11/2017   Procedure: COLONOSCOPY WITH PROPOFOL;  Surgeon: [name], MD;  Location: [facility] ENDOSCOPY;  Service: Endoscopy;  Laterality: N/A;  . COLONOSCOPY WITH PROPOFOL N/A 02/22/2017   Procedure: COLONOSCOPY WITH PROPOFOL;  Surgeon: [name], MD;  Location: [facility] ENDOSCOPY;  Service: Endoscopy;  Laterality: N/A;  . COLONOSCOPY WITH PROPOFOL N/A 05/31/2017   Procedure: COLONOSCOPY WITH PROPOFOL;  Surgeon: [name], MD;  Location: [facility] ENDOSCOPY;  Service: Endoscopy;  Laterality: N/A;  . ESOPHAGOGASTRODUODENOSCOPY (EGD) WITH PROPOFOL N/A 01/11/2017   Procedure: ESOPHAGOGASTRODUODENOSCOPY (EGD) WITH PROPOFOL;  Surgeon: [name], MD;  Location: [facility] ENDOSCOPY;  Service: Endoscopy;  Laterality: N/A;  . ESOPHAGOGASTRODUODENOSCOPY (EGD) WITH PROPOFOL N/A 10/25/2018   Procedure: ESOPHAGOGASTRODUODENOSCOPY (EGD) WITH PROPOFOL;  Surgeon: [name], MD;  Location: [facility] ENDOSCOPY;  Service: Endoscopy;  Laterality: N/A;  . ESOPHAGOGASTRODUODENOSCOPY (EGD) WITH PROPOFOL N/A 11/29/2019   Procedure: ESOPHAGOGASTRODUODENOSCOPY (EGD) WITH PROPOFOL;  Surgeon: Toledo, Benay Pike, MD;  Location: [facility] ENDOSCOPY;  Service: Gastroenterology;  Laterality: N/A;  . HERNIA REPAIR     umbilical  . HIATAL HERNIA REPAIR    . I & D EXTREMITY Left 06/27/2015   Procedure: IRRIGATION AND DEBRIDEMENT EXTREMITY            ( CALF HEMATOMA ) POSSIBLE WOUND VAC;  Surgeon: [name], MD;  Location: [facility] ORS;  Service: Vascular;  Laterality: Left;  . INCISION AND DRAINAGE OF WOUND Left 09/25/2016    Procedure: IRRIGATION AND DEBRIDEMENT - PARTIAL RESECTION OF ACHILLES TENDON WITH WOUND VAC APPLICATION;  Surgeon: [name], DPM;  Location: [facility] ORS;  Service: Podiatry;  Laterality: Left;  . INCISION AND DRAINAGE OF WOUND Left 11/09/2018   Procedure:  Excision of left foot wound with ACell placement;  Surgeon: Wallace Going, DO;  Location: [place];  Service: Plastics;  Laterality: Left;  . IRRIGATION AND DEBRIDEMENT ABSCESS Left 09/07/2016   Procedure: IRRIGATION AND DEBRIDEMENT ABSCESS LEFT HEEL;  Surgeon: [name], DPM;  Location: [facility] ORS;  Service: Podiatry;  Laterality: Left;  . JOINT REPLACEMENT  2013   left knee replacement  . KIDNEY SURGERY Right    kidney stones  . LITHOTRIPSY    . LOWER EXTREMITY ANGIOGRAPHY Left 04/04/2018   Procedure: LOWER EXTREMITY ANGIOGRAPHY;  Surgeon: [name], MD;  Location: [place] CV LAB;  Service: Cardiovascular;  Laterality: Left;  . NM GATED MYOVIEW ([place] [facility])  February 2017   Likely breast attenuation. LOW RISK study. Normal EF 55-60%.  . OTHER SURGICAL HISTORY     08/2016 or 09/2016 surgery on achilles tenso h/o staph infection heal. Dr. [name]  . removal of left hematoma Left    leg  . REVERSE SHOULDER ARTHROPLASTY Right 10/08/2015   Procedure: REVERSE SHOULDER ARTHROPLASTY;  Surgeon: [name], MD;  Location: [facility] ORS;  Service: Orthopedics;  Laterality: Right;  . SLEEVE GASTROPLASTY    . TONSILLECTOMY    . TOTAL KNEE ARTHROPLASTY Left   . TOTAL SHOULDER REPLACEMENT Right   . TRANSESOPHAGEAL ECHOCARDIOGRAM  08/08/2013   Mild LVH, EF 60-65%. Moderate LA dilation and mild RA dilation. Mild MR with no evidence of stenosis and no evidence of endocarditis. A false chordae is noted.  . TRANSTHORACIC ECHOCARDIOGRAM  08/03/2013   Mild-Moderate concentric LVH, EF 60-65%. Normal diastolic function. Mild LA dilation. Mild MR with possible vegetation  - not confirmed on TEE   . ULNAR NERVE TRANSPOSITION Right 08/06/2016    Procedure: ULNAR NERVE DECOMPRESSION/TRANSPOSITION;  Surgeon: [name], MD;  Location: [facility] ORS;  Service: Orthopedics;  Laterality: Right;  . UPPER GI ENDOSCOPY    . VAGINAL HYSTERECTOMY      Family History  Problem Relation Age of Onset  . Skin cancer Father   . Diabetes Father   . Hypertension Father   . Peripheral vascular disease Father   . Cancer Father   . Cerebral aneurysm Father   . Alcohol abuse Father   . Varicose Veins Mother   . Kidney disease Mother   . Arthritis Mother   . Mental illness Sister   . Cancer Maternal Aunt        breast  . Breast cancer Maternal Aunt   . Arthritis Maternal Grandmother   . Hypertension Maternal Grandmother   . Diabetes Maternal Grandmother   . Arthritis Maternal Grandfather   . Heart disease Maternal Grandfather   . Hypertension Maternal Grandfather   . Arthritis Paternal Grandmother   . Hypertension Paternal Grandmother   . Diabetes Paternal Grandmother   . Arthritis Paternal Grandfather   . Hypertension Paternal Grandfather   . Bladder Cancer Neg Hx   . Kidney cancer Neg Hx               Social History:  reports that she has never smoked. She has never used smokeless tobacco. She reports that she does not drink alcohol and does not use drugs.  Allergies  Allergen Reactions  . Morphine And Related Other (See Comments) and Nausea Only    Patient becomes very confused Other reaction(s): Other (See Comments), Other (See Comments) Patient becomes very confused Patient becomes very confused   . Penicillins Hives, Shortness Of Breath, Swelling and Other (See Comments)  Facial swelling Has patient had a PCN reaction causing immediate rash, facial/tongue/throat swelling, SOB or lightheadedness with hypotension: Yes Has patient had a PCN reaction causing severe rash involving mucus membranes or skin necrosis: Yes Has patient had a PCN reaction that required hospitalization No Has patient had a PCN reaction occurring within  the last 10 years: No If all of the above answers are "NO", then may proceed with Cephalosporin use.  Other reaction(s): Other (See Comments), Other (See Comments) Facial swelling Has patient had a PCN reaction causing immediate rash, facial/tongue/throat swelling, SOB or lightheadedness with hypotension: Yes Has patient had a PCN reaction causing severe rash involving mucus membranes or skin necrosis: Yes Has patient had a PCN reaction that required hospitalization No Has patient had a PCN reaction occurring within the last 10 years: No If all of the above answers are "NO", then may proceed with Cephalosporin use. Facial swelling Has patient had a PCN reaction causing immediate rash, facial/tongue/throat swelling, SOB or lightheadedness with hypotension: Yes Has patient had a PCN reaction causing severe rash involving mucus membranes or skin necrosis: Yes Has patient had a PCN reaction that required hospitalization No Has patient had a PCN reaction occurring within the last 10 years: No If all of the above answers are "NO", then may proceed with Cephalosporin use. Facial swelling Has patient had a PCN reaction causing immediate rash, facial/tongue/throat swelling, SOB or lightheadedness with hypotension: Yes Has patient had a PCN reaction causing severe rash involving mucus membranes or skin necrosis: Yes Has patient had a PCN reaction that required hospitalization No Has patient had a PCN reaction occurring within the last 10 years: No If all of the above answers are "NO", then may proceed with Cephalosporin use.   . Ambien [Zolpidem]     Hallucination   . Aspirin Hives  . Oxytetracycline Hives    Other reaction(s): Unknown     MEDICATIONS:                                                                                                                     Current Facility-Administered Medications on File Prior to Encounter  Medication Dose Route Frequency Provider Last Rate Last  Admin  . ipratropium-albuterol (DUONEB) 0.5-2.5 (3) MG/3ML nebulizer solution 3 mL  3 mL Nebulization Q6H [name], [name], MD       Current Outpatient Medications on File Prior to Encounter  Medication Sig Dispense Refill  . acetaminophen (TYLENOL) 325 MG tablet Take 2 tablets (650 mg total) by mouth every 6 (six) hours as needed for mild pain or moderate pain (or Fever >/= 101).    Marland Kitchen albuterol (PROVENTIL) (2.5 MG/3ML) 0.083% nebulizer solution Take 2.5 mg by nebulization every 4 (four) hours as needed for wheezing or shortness of breath.    . ALPRAZolam (XANAX) 0.5 MG tablet Take 0.5 mg by mouth at bedtime as needed for anxiety or sleep.    Marland Kitchen amLODipine (NORVASC) 5 MG tablet Take 1 tablet (5 mg total) by mouth  daily. 90 tablet 1  . apixaban (ELIQUIS) 5 MG TABS tablet Take 1 tablet (5 mg total) by mouth 2 (two) times daily. 180 tablet 2  . buPROPion (WELLBUTRIN XL) 150 MG 24 hr tablet Take 1 tablet (150 mg total) by mouth daily. 90 tablet 3  . calcium-vitamin D (OSCAL WITH D) 500-200 MG-UNIT tablet Take 1 tablet by mouth 2 (two) times daily with a meal. 60 tablet 0  . cholecalciferol (VITAMIN D) 25 MCG tablet Take 1 tablet (1,000 Units total) by mouth 2 (two) times daily with a meal. 30 tablet 2  . clobetasol cream (TEMOVATE) 5.57 % Apply 1 application topically 2 (two) times daily as needed (skin irritation).     . colchicine 0.6 MG tablet Take 0.6 mg by mouth 2 (two) times daily as needed (acute gout flare).     . diclofenac Sodium (VOLTAREN) 1 % GEL Apply 2 g topically 3 (three) times daily. 100 g 2  . DULoxetine (CYMBALTA) 60 MG capsule Take 1 capsule (60 mg total) by mouth daily. 30 capsule 2  . ergocalciferol (VITAMIN D2) 1.25 MG (50000 UT) capsule Take 50,000 Units by mouth once a week.    . estradiol (ESTRACE) 0.1 MG/GM vaginal cream Place 1 Applicatorful vaginally every Monday, Wednesday, and Friday. Apply 0.29m (pea-sized amount)  just inside the vaginal introitus with a  finger-tip on Monday, Wednesday and Friday nights, 42.5 g 11  . fluticasone-salmeterol (ADVAIR HFA) 115-21 MCG/ACT inhaler Inhale 2 puffs into the lungs daily. Rinse mouth thoroughly after use 1 Inhaler 12  . folic acid (FOLVITE) 1 MG tablet Take 1 tablet (1 mg total) by mouth daily. 30 tablet 2  . HYDROcodone-acetaminophen (NORCO/VICODIN) 5-325 MG tablet Take 1 tablet by mouth every 8 (eight) hours as needed for severe pain. Must last 30 days 90 tablet 0  . HYDROcodone-acetaminophen (NORCO/VICODIN) 5-325 MG tablet Take 1 tablet by mouth every 8 (eight) hours as needed for severe pain. Must last 30 days 90 tablet 0  . [START ON 04/19/2020] HYDROcodone-acetaminophen (NORCO/VICODIN) 5-325 MG tablet Take 1 tablet by mouth every 8 (eight) hours as needed for severe pain. Must last 30 days 90 tablet 0  . levocetirizine (XYZAL) 5 MG tablet Take 0.5 tablets (2.5 mg total) by mouth every evening. (Patient taking differently: Take 5 mg by mouth at bedtime as needed for allergies.) 45 tablet 3  . levothyroxine (SYNTHROID) 175 MCG tablet Take 1 tablet (175 mcg total) by mouth daily before breakfast. 90 tablet 1  . losartan-hydrochlorothiazide (HYZAAR) 50-12.5 MG tablet Take 1 tablet by mouth daily.    . metFORMIN (GLUCOPHAGE) 500 MG tablet Take 1 tablet (500 mg total) by mouth daily with breakfast. With food 90 tablet 3  . metoprolol succinate (TOPROL-XL) 50 MG 24 hr tablet TAKE 1 TABLET BY MOUTH DAILY (Patient taking differently: Take 50 mg by mouth daily.) 90 tablet 1  . Misc. Devices (TUB TRANSFER BOARD) MISC 1 Device by Does not apply route as needed. 1 each 0  . montelukast (SINGULAIR) 10 MG tablet Take 1 tablet (10 mg total) by mouth at bedtime. 90 tablet 1  . Multiple Vitamin (MULTIVITAMIN WITH MINERALS) TABS tablet Take 1 tablet by mouth daily.    . ondansetron (ZOFRAN) 4 MG tablet Take 1 tablet (4 mg total) by mouth every 8 (eight) hours as needed for nausea or vomiting. 90 tablet 1  . oxybutynin  (DITROPAN-XL) 10 MG 24 hr tablet Take 10 mg by mouth at bedtime.    .Marland Kitchen  pantoprazole (PROTONIX) 40 MG tablet Take 40 mg by mouth daily.     . pregabalin (LYRICA) 50 MG capsule Take 1 capsule (50 mg total) by mouth 3 (three) times daily. 90 capsule 2  . rosuvastatin (CRESTOR) 20 MG tablet Take 1 tablet (20 mg total) by mouth daily. 30 tablet 5  . sitaGLIPtin (JANUVIA) 25 MG tablet Take 1 tablet (25 mg total) by mouth daily. 90 tablet 3  . sucralfate (CARAFATE) 1 GM/10ML suspension Take 1 g by mouth 3 (three) times daily.     Marland Kitchen thiamine 100 MG tablet Take 0.5 tablets (50 mg total) by mouth 2 (two) times daily with a meal. 60 tablet 2  . TOVIAZ 8 MG TB24 tablet TAKE ONE TABLET BY MOUTH EVERY DAY (Patient taking differently: Take 8 mg by mouth daily.) 90 tablet 3  . trospium (SANCTURA) 20 MG tablet Take 20 mg by mouth 2 (two) times daily.    . vitamin B-12 1000 MCG tablet Take 1 tablet (1,000 mcg total) by mouth daily. 30 tablet 2    Scheduled: .  stroke: mapping our early stages of recovery book   Does not apply Once  . amLODipine  5 mg Oral Daily  . apixaban  5 mg Oral BID  . buPROPion  150 mg Oral Daily  . cholecalciferol  1,000 Units Oral BID WC  . DULoxetine  60 mg Oral Daily  . folic acid  1 mg Oral Daily  . losartan  50 mg Oral Daily   And  . hydrochlorothiazide  12.5 mg Oral Daily  . ipratropium-albuterol  3 mL Nebulization Q6H  . ipratropium-albuterol  3 mL Nebulization Q6H  . [START ON 04/07/2020] levothyroxine  175 mcg Oral Q0600  . linagliptin  5 mg Oral Daily  . [START ON 04/07/2020] metFORMIN  500 mg Oral Q breakfast  . metoprolol succinate  50 mg Oral Daily  . mometasone-formoterol  2 puff Inhalation BID  . multivitamin with minerals  1 tablet Oral Daily  . rosuvastatin  20 mg Oral Daily  . thiamine  50 mg Oral BID WC  . cyanocobalamin  1,000 mcg Oral Daily  . [START ON 04/12/2020] Vitamin D (Ergocalciferol)  50,000 Units Oral Weekly    ROS:                                                                                                                                        Has had headaches intermittently for the last several months. Other ROS as per HPI.    Blood pressure 134/70, pulse 69, temperature 97.9 F (36.6 C), temperature source Oral, resp. rate 18, SpO2 94 %.   General Examination:  Physical Exam  HEENT-  [place]/AT. Poorly hydrated oral mucosa.  Lungs- Respirations unlabored Extremities- Chronic pigmentary changes to anterior aspects of lower legs bilaterally  Neurological Examination Mental Status: Awake and alert. Speech fluent with intact comprehension and naming. Oriented x 5. Good attention and concentration. Pleasant and cooperative.  Cranial Nerves: II: Visual fields intact with no extinction to DSS. PERRL.  III,IV, VI: No ptosis. EOMI. No nystagmus.  V,VII: Smile symmetric, facial temp sensation decreased on the right.  VIII: hearing intact to voice IX,X: No hypophonia or hoarseness XI: Head is midline XII: midline tongue extension Motor: RUE 5/5 proximally and distally LUE 4/5 proximally and distally RLE 4/5 hip flexion and knee extension LLE 4-/5 hip flexion and knee extension Sensory: Temp and FT intact proximally in upper and lower extremities. Stocking-distribution sensory loww with proxima-distal gradient below the knees.   Deep Tendon Reflexes: 1+ bilateral brachioradialis and patellae. 0 achilles bilaterally.  Cerebellar: No ataxia with FNF bilaterally  Gait: Deferred   Lab Results: Basic Metabolic Panel: Recent Labs  Lab 04/05/20 1215 04/06/20 1627  NA 142 140  K 4.7 4.8  CL 104 102  CO2 26 27  GLUCOSE 102* 107*  BUN 25* 25*  CREATININE 1.13* 1.11*  CALCIUM 9.1 9.1    CBC: Recent Labs  Lab 04/05/20 1215  WBC 7.1  NEUTROABS 5.4  HGB 10.0*  HCT 32.9*  MCV 86.8  PLT 247    Cardiac Enzymes: No results  for input(s): CKTOTAL, CKMB, CKMBINDEX, TROPONINI in the last 168 hours.  Lipid Panel: No results for input(s): CHOL, TRIG, HDL, CHOLHDL, VLDL, LDLCALC in the last 168 hours.  Imaging: DG Chest 2 View  Result Date: 04/05/2020 CLINICAL DATA:  Hypoxia. EXAM: CHEST - 2 VIEW COMPARISON:  02/05/2020 FINDINGS: Heart size remains within normal limits. Low lung volumes are again seen with stable elevation right hemidiaphragm and right lower lobe scarring. No evidence of pulmonary consolidation or pleural effusion. Right shoulder prosthesis again seen. IMPRESSION: Stable low lung volumes and right lower lobe scarring. No acute findings. Electronically Signed   By: Marlaine Hind M.D.   On: 04/05/2020 12:06   MR Brain Wo Contrast  Result Date: 04/05/2020 CLINICAL DATA:  Stroke.  Bilateral leg weakness. EXAM: MRI HEAD WITHOUT CONTRAST TECHNIQUE: Multiplanar, multiecho pulse sequences of the brain and surrounding structures were obtained without intravenous contrast. COMPARISON:  02/05/2020 FINDINGS: Brain: Diffusion imaging shows a punctate focus of restricted diffusion in the right external capsule region consistent with acute small vessel infarction. There are a few smudgy areas of restricted diffusion in the right side of the splenium of the corpus callosum that could also represent small vessel infarctions. The differential diagnosis does include a demyelinating process. Elsewhere, chronic abnormal T2 and FLAIR signal within the deep and subcortical hemispheric white matter appears unchanged and most consistent with chronic small vessel disease. Coexistence demyelinating disease is not excluded. Right posterior parietal cortical and subcortical infarction is unchanged. Numerous dilated perivascular spaces throughout the basal ganglia regions. Few old small vessel insults also affect the thalami and brainstem. Previously seen acute infarctions have undergone expected evolutionary change with loss restricted  diffusion. No evidence of mass, hemorrhage, hydrocephalus or extra-axial collection. Vascular: Major vessels at the base of the brain show flow. Skull and upper cervical spine: Negative Sinuses/Orbits: Clear/normal Other: None IMPRESSION: 1. Punctate focus of restricted diffusion in the right external capsule region consistent with acute small vessel infarction. Few smudgy areas of restricted diffusion in the right side of  the splenium of the corpus callosum that could also represent small vessel infarctions. The differential diagnosis does include demyelinating disease, but that is felt less likely. 2. Chronic small-vessel ischemic changes elsewhere throughout the brain as outlined above. Electronically Signed   By: Nelson Chimes M.D.   On: 04/05/2020 12:04   DG Toe 2nd Left  Result Date: 04/05/2020 CLINICAL DATA:  Left second toe gangrene. EXAM: LEFT SECOND TOE COMPARISON:  December 19, 2019. FINDINGS: Stable old fractures are seen involving the second and third proximal phalanges as well as probable severe degenerative changes seen involving the first metatarsophalangeal joint. No acute fracture or lytic destruction is noted. Soft tissues are unremarkable. IMPRESSION: No acute fracture or lytic destruction is noted to suggest osteomyelitis. Stable old fractures are seen involving the second and third proximal phalanges. Electronically Signed   By: Marijo Conception M.D.   On: 04/05/2020 14:49    Assessment: 70 year old female with atrial fibrillation (on Eliquis) presenting to the ED after she received a call yesterday to be evaluated due to outpatient MRI brain revealing acute to subacute punctate right cerebral hemisphere ischemic infarctions.   1. Exam reveals some asymmetry of motor strength on diffuse weakness, the latter most likely secondary to deconditioning. Also with stocking-distribution peripheral neuropathy. Mentation is intact.  2. MRI brain: Punctate focus of restricted diffusion in the  right external capsule region consistent with acute small vessel infarction. Few smudgy areas of restricted diffusion in the right side of the splenium of the corpus callosum that could also represent small vessel infarctions. Multiple chronic white matter lesions appearing most consistent with chronic microvascular ischemic changes are also noted. On review of the images by Neurology, the lesions do not appear consistent with demyelinating disease.  3. Given that this is the 3rd episode of ischemic infarctions despite anticoagulation, further work up is indicated. The strokes could simply be due to chronic small vessel disease, but the clustering of the recent strokes in a relatively short period of time suggest a possible underlying hypercoagulable state partially refractory to anticoagulation or a small vessel vasculitis given her intermittent headaches. Of note, small vessel vasculitides are difficult to diagnose as the affected vessels are too small to visualize on angiography, LP has a high false-negative rate and risks of biopsy most likely outweigh benefits. Will need to rely on serum markers for initial work up. Another consideration would be recurrent cardioembolic strokes given her atrial fibrillation; she is already on maximal medical management for this with Eliquis. Of note, her CRP was elevated at 7 in November 2021, suggestive of an inflammatory process, but she does have arthritis.  4. Recent TTE in June of 2021 revealed normal left ventricular function. No mural thrombus or PFO was mentioned in the report.  5. Carotid ultrasound from October 2021: Bilateral carotid bifurcation plaque resulting in less than 50% diameter ICA stenosis. Antegrade bilateral vertebral arterial flow. 6. MRA October 2021: Negative for large vessel occlusion. Some circle-of-Willis branch detail is degraded by motion, but relatively mild for age intracranial atherosclerosis is suspected. 7. Stroke risk factors: DM, DVT,  paroxysmal atrial fibrillation,HLD, HTN, PVD and sleep apnea   Recommendations: 1. Consider TEE.  2. Continue Eliquis.  3. ESR, CRP, ANA panel and lupus anticoagulant panel (ordered) 4. Vasculitis panel has been ordered as a send-out to [facility] 5. PT/OT/Speech 6. BP management.     Electronically signed: Dr. Kerney Elbe 04/06/2020, 8:25 PM

## 2020-04-07 ENCOUNTER — Observation Stay (HOSPITAL_COMMUNITY)
Admit: 2020-04-07 | Discharge: 2020-04-07 | Disposition: A | Discharge status: Home / Self Care | Payer: Medicare HMO | Attending: Internal Medicine | Admitting: Internal Medicine

## 2020-04-07 DIAGNOSIS — L97421 Non-pressure chronic ulcer of left heel and midfoot limited to breakdown of skin: Secondary | ICD-10-CM | POA: Diagnosis not present

## 2020-04-07 DIAGNOSIS — N189 Chronic kidney disease, unspecified: Secondary | ICD-10-CM | POA: Diagnosis not present

## 2020-04-07 DIAGNOSIS — E11621 Type 2 diabetes mellitus with foot ulcer: Secondary | ICD-10-CM | POA: Diagnosis not present

## 2020-04-07 DIAGNOSIS — I340 Nonrheumatic mitral (valve) insufficiency: Secondary | ICD-10-CM | POA: Diagnosis not present

## 2020-04-07 DIAGNOSIS — I69354 Hemiplegia and hemiparesis following cerebral infarction affecting left non-dominant side: Secondary | ICD-10-CM | POA: Diagnosis not present

## 2020-04-07 DIAGNOSIS — I6389 Other cerebral infarction: Secondary | ICD-10-CM

## 2020-04-07 DIAGNOSIS — L97522 Non-pressure chronic ulcer of other part of left foot with fat layer exposed: Secondary | ICD-10-CM | POA: Diagnosis not present

## 2020-04-07 DIAGNOSIS — I6521 Occlusion and stenosis of right carotid artery: Secondary | ICD-10-CM | POA: Diagnosis not present

## 2020-04-07 DIAGNOSIS — K589 Irritable bowel syndrome without diarrhea: Secondary | ICD-10-CM | POA: Diagnosis present

## 2020-04-07 DIAGNOSIS — G894 Chronic pain syndrome: Secondary | ICD-10-CM | POA: Diagnosis present

## 2020-04-07 DIAGNOSIS — M1990 Unspecified osteoarthritis, unspecified site: Secondary | ICD-10-CM | POA: Diagnosis present

## 2020-04-07 DIAGNOSIS — I6523 Occlusion and stenosis of bilateral carotid arteries: Secondary | ICD-10-CM | POA: Diagnosis not present

## 2020-04-07 DIAGNOSIS — F419 Anxiety disorder, unspecified: Secondary | ICD-10-CM | POA: Diagnosis present

## 2020-04-07 DIAGNOSIS — L97529 Non-pressure chronic ulcer of other part of left foot with unspecified severity: Secondary | ICD-10-CM | POA: Diagnosis not present

## 2020-04-07 DIAGNOSIS — E1152 Type 2 diabetes mellitus with diabetic peripheral angiopathy with gangrene: Secondary | ICD-10-CM | POA: Diagnosis not present

## 2020-04-07 DIAGNOSIS — I639 Cerebral infarction, unspecified: Secondary | ICD-10-CM | POA: Diagnosis not present

## 2020-04-07 DIAGNOSIS — I472 Ventricular tachycardia: Secondary | ICD-10-CM | POA: Diagnosis not present

## 2020-04-07 DIAGNOSIS — J45909 Unspecified asthma, uncomplicated: Secondary | ICD-10-CM | POA: Diagnosis not present

## 2020-04-07 DIAGNOSIS — E1151 Type 2 diabetes mellitus with diabetic peripheral angiopathy without gangrene: Secondary | ICD-10-CM | POA: Diagnosis not present

## 2020-04-07 DIAGNOSIS — M722 Plantar fascial fibromatosis: Secondary | ICD-10-CM | POA: Diagnosis not present

## 2020-04-07 DIAGNOSIS — K219 Gastro-esophageal reflux disease without esophagitis: Secondary | ICD-10-CM | POA: Diagnosis present

## 2020-04-07 DIAGNOSIS — Z20822 Contact with and (suspected) exposure to covid-19: Secondary | ICD-10-CM | POA: Diagnosis not present

## 2020-04-07 DIAGNOSIS — Z6841 Body Mass Index (BMI) 40.0 and over, adult: Secondary | ICD-10-CM | POA: Diagnosis not present

## 2020-04-07 DIAGNOSIS — E039 Hypothyroidism, unspecified: Secondary | ICD-10-CM | POA: Diagnosis present

## 2020-04-07 DIAGNOSIS — I129 Hypertensive chronic kidney disease with stage 1 through stage 4 chronic kidney disease, or unspecified chronic kidney disease: Secondary | ICD-10-CM | POA: Diagnosis present

## 2020-04-07 DIAGNOSIS — I6603 Occlusion and stenosis of bilateral middle cerebral arteries: Secondary | ICD-10-CM | POA: Diagnosis not present

## 2020-04-07 DIAGNOSIS — I4819 Other persistent atrial fibrillation: Secondary | ICD-10-CM | POA: Diagnosis not present

## 2020-04-07 DIAGNOSIS — M6281 Muscle weakness (generalized): Secondary | ICD-10-CM | POA: Diagnosis not present

## 2020-04-07 DIAGNOSIS — I672 Cerebral atherosclerosis: Secondary | ICD-10-CM | POA: Diagnosis not present

## 2020-04-07 DIAGNOSIS — Z7901 Long term (current) use of anticoagulants: Secondary | ICD-10-CM | POA: Diagnosis not present

## 2020-04-07 DIAGNOSIS — I480 Paroxysmal atrial fibrillation: Secondary | ICD-10-CM | POA: Diagnosis not present

## 2020-04-07 DIAGNOSIS — I6340 Cerebral infarction due to embolism of unspecified cerebral artery: Secondary | ICD-10-CM | POA: Diagnosis not present

## 2020-04-07 DIAGNOSIS — I651 Occlusion and stenosis of basilar artery: Secondary | ICD-10-CM | POA: Diagnosis not present

## 2020-04-07 DIAGNOSIS — I361 Nonrheumatic tricuspid (valve) insufficiency: Secondary | ICD-10-CM | POA: Diagnosis not present

## 2020-04-07 DIAGNOSIS — E1142 Type 2 diabetes mellitus with diabetic polyneuropathy: Secondary | ICD-10-CM | POA: Diagnosis not present

## 2020-04-07 DIAGNOSIS — I10 Essential (primary) hypertension: Secondary | ICD-10-CM | POA: Diagnosis not present

## 2020-04-07 DIAGNOSIS — I69398 Other sequelae of cerebral infarction: Secondary | ICD-10-CM | POA: Diagnosis not present

## 2020-04-07 DIAGNOSIS — E43 Unspecified severe protein-calorie malnutrition: Secondary | ICD-10-CM | POA: Diagnosis present

## 2020-04-07 DIAGNOSIS — Z7189 Other specified counseling: Secondary | ICD-10-CM | POA: Diagnosis not present

## 2020-04-07 DIAGNOSIS — Z7984 Long term (current) use of oral hypoglycemic drugs: Secondary | ICD-10-CM | POA: Diagnosis not present

## 2020-04-07 DIAGNOSIS — E785 Hyperlipidemia, unspecified: Secondary | ICD-10-CM | POA: Diagnosis not present

## 2020-04-07 DIAGNOSIS — L97422 Non-pressure chronic ulcer of left heel and midfoot with fat layer exposed: Secondary | ICD-10-CM | POA: Diagnosis not present

## 2020-04-07 DIAGNOSIS — Z515 Encounter for palliative care: Secondary | ICD-10-CM | POA: Diagnosis not present

## 2020-04-07 DIAGNOSIS — Z8673 Personal history of transient ischemic attack (TIA), and cerebral infarction without residual deficits: Secondary | ICD-10-CM | POA: Diagnosis not present

## 2020-04-07 DIAGNOSIS — F32A Depression, unspecified: Secondary | ICD-10-CM | POA: Diagnosis present

## 2020-04-07 DIAGNOSIS — G4733 Obstructive sleep apnea (adult) (pediatric): Secondary | ICD-10-CM | POA: Diagnosis present

## 2020-04-07 DIAGNOSIS — E1122 Type 2 diabetes mellitus with diabetic chronic kidney disease: Secondary | ICD-10-CM | POA: Diagnosis present

## 2020-04-07 DIAGNOSIS — E1140 Type 2 diabetes mellitus with diabetic neuropathy, unspecified: Secondary | ICD-10-CM | POA: Diagnosis not present

## 2020-04-07 LAB — URINE DRUG SCREEN, QUALITATIVE (ARMC ONLY)
Amphetamines, Ur Screen: NOT DETECTED
Barbiturates, Ur Screen: NOT DETECTED
Benzodiazepine, Ur Scrn: POSITIVE — AB
Cannabinoid 50 Ng, Ur ~~LOC~~: NOT DETECTED
Cocaine Metabolite,Ur ~~LOC~~: NOT DETECTED
MDMA (Ecstasy)Ur Screen: NOT DETECTED
Methadone Scn, Ur: NOT DETECTED
Opiate, Ur Screen: POSITIVE — AB
Phencyclidine (PCP) Ur S: NOT DETECTED
Tricyclic, Ur Screen: NOT DETECTED

## 2020-04-07 LAB — ECHOCARDIOGRAM COMPLETE BUBBLE STUDY: S' Lateral: 2.6 cm

## 2020-04-07 LAB — LIPID PANEL
Cholesterol: 65 mg/dL (ref 0–200)
HDL: 46 mg/dL (ref >40)
LDL Cholesterol: 9 mg/dL (ref 0–99)
Total CHOL/HDL Ratio: 1.4 RATIO
Triglycerides: 51 mg/dL (ref <150)
VLDL: 10 mg/dL (ref 0–40)

## 2020-04-07 LAB — URINALYSIS, ROUTINE W REFLEX MICROSCOPIC
Bilirubin Urine: NEGATIVE
Glucose, UA: NEGATIVE mg/dL
Hgb urine dipstick: NEGATIVE
Ketones, ur: NEGATIVE mg/dL
Leukocytes,Ua: NEGATIVE
Nitrite: NEGATIVE
Protein, ur: NEGATIVE mg/dL
Specific Gravity, Urine: 1.008 (ref 1.005–1.030)
pH: 5 (ref 5.0–8.0)

## 2020-04-07 LAB — HEMOGLOBIN A1C
Hgb A1c MFr Bld: 6.7 % — ABNORMAL HIGH (ref 4.8–5.6)
Mean Plasma Glucose: 145.59 mg/dL

## 2020-04-07 LAB — CBG MONITORING, ED: Glucose-Capillary: 102 mg/dL — ABNORMAL HIGH (ref 70–99)

## 2020-04-07 MED ORDER — TRAMADOL HCL 50 MG PO TABS
50.0000 mg | ORAL_TABLET | Freq: Four times a day (QID) | ORAL | Status: DC | PRN
  Administered 2020-04-08 – 2020-04-14 (×8): 50 mg via ORAL
  Filled 2020-04-07 (×9): qty 1

## 2020-04-07 MED ORDER — BACITRACIN ZINC 500 UNIT/GM EX OINT
TOPICAL_OINTMENT | Freq: Two times a day (BID) | CUTANEOUS | Status: AC
  Filled 2020-04-07: qty 1.8
  Filled 2020-04-07 (×2): qty 0.9

## 2020-04-07 MED ORDER — FESOTERODINE FUMARATE ER 8 MG PO TB24
8.0000 mg | ORAL_TABLET | Freq: Every day | ORAL | Status: DC
  Administered 2020-04-07 – 2020-04-15 (×9): 8 mg via ORAL
  Filled 2020-04-07 (×10): qty 1

## 2020-04-07 MED ORDER — OXYBUTYNIN CHLORIDE ER 5 MG PO TB24
10.0000 mg | ORAL_TABLET | Freq: Every day | ORAL | Status: DC
  Administered 2020-04-07 – 2020-04-14 (×8): 10 mg via ORAL
  Filled 2020-04-07 (×4): qty 2
  Filled 2020-04-07: qty 1
  Filled 2020-04-07: qty 2
  Filled 2020-04-07: qty 1
  Filled 2020-04-07 (×2): qty 2

## 2020-04-07 NOTE — Progress Notes (Signed)
PROGRESS NOTE    [name]  TKP:[id number] DOB: [birth year] DOA: 04/06/2020 PCP: [name], Pasty Spillers, MD   Chief complaint.  Left-sided weakness. Brief Narrative:  [name] is a 70 y.o. adult with medical history significant for CVA, hypertension, diabetes mellitus type 2, morbid obesity, chronic pain, paroxysmal atrial fibrillation, previous DVT, OSA on CPAP, presented to the emergency department for chief concerns of primary care provider telling her to come to the emergency department for further evaluation due to new stroke. Patient states that the she has a left arm and left leg weakness for the last 2 or 3 months.  But she is very vague about her symptoms, she states that she might of had worsening weakness 5 days ago. MRI of the brain showed several small infarcts in the right side of brain.    Assessment & Plan:   Active Problems:   Stroke (HCC)  #1.  Acute embolic stroke. Paroxysmal atrial fibrillation. It is unclear when patient had symptoms.  MRI showed several small infarcts in the right side of brain.  But appear to be acute. Patient is chronically taking Eliquis, it is seems is not working. Pending neurology consult, most likely will need change anticoagulation. Echocardiogram is pending. Continue physical therapy/Occupational Therapy. I will change patient's status to inpatient, as we complete further stroke work-up and to make a decision about further treatment.  #2.  Essential hypertension for Continues on home medicines for  3.  Left lower extremity second toe ulceration. Wound care will evaluate the patient.  No evidence of acute infection, patient will be referred to outpatient podiatry.  4.  Type 2 diabetes. Continue home regimen.      DVT prophylaxis: Eliquis Code Status: Full Family Communication:  Disposition Plan:  .   Status is: Observation  The patient will require care spanning > 2 midnights and should be moved to inpatient  because: Ongoing diagnostic testing needed not appropriate for outpatient work up and Inpatient level of care appropriate due to severity of illness  Dispo: The patient is from: Home              Anticipated d/c is to: Home              Anticipated d/c date is: 1 day              Patient currently is not medically stable to d/c.        No intake/output data recorded. No intake/output data recorded.     Consultants:   Neurology  Procedures: None  Antimicrobials:None  Subjective: Patient is a poor historian, he states that she still has some left arm pain and weakness, bilateral foot pain and the left leg weakness. Denies any short of breath or cough. No fever or chills. No abdominal pain or nausea vomiting.  Objective: Vitals:   04/07/20 0930 04/07/20 1000 04/07/20 1015 04/07/20 1030  BP: 133/67 (!) 111/55 123/70 107/64  Pulse: (!) 51 66  68  Resp: 20 15 12  (!) 25  Temp:      TempSrc:      SpO2: 100% 98%  96%   No intake or output data in the 24 hours ending 04/07/20 1251 There were no vitals filed for this visit.  Examination:  General exam: Appears calm and comfortable  Respiratory system: Clear to auscultation. Respiratory effort normal. Cardiovascular system: S1 & S2 heard, RRR. No JVD, murmurs, rubs, gallops or clicks. No pedal edema. Gastrointestinal system: Abdomen is nondistended, soft  and nontender. No organomegaly or masses felt. Normal bowel sounds heard. Central nervous system: Alert and oriented x2. No focal neurological deficits. Extremities: left second toe ulcer. Bilateral LEs chronic changes with discoloration Skin: No rashes, lesions or ulcers Psychiatry:  Mood & affect appropriate.     Data Reviewed: I have personally reviewed following labs and imaging studies  CBC: Recent Labs  Lab 04/05/20 1215  WBC 7.1  NEUTROABS 5.4  HGB 10.0*  HCT 32.9*  MCV 86.8  PLT 563   Basic Metabolic Panel: Recent Labs  Lab 04/05/20 1215  04/06/20 1627  NA 142 140  K 4.7 4.8  CL 104 102  CO2 26 27  GLUCOSE 102* 107*  BUN 25* 25*  CREATININE 1.13* 1.11*  CALCIUM 9.1 9.1   GFR: Estimated Creatinine Clearance (by C-G formula based on SCr of 1.11 mg/dL (H)) Female: 55.2 mL/min (A) Female: 67.3 mL/min (A) Liver Function Tests: Recent Labs  Lab 04/05/20 1215  AST 18  ALT 14  ALKPHOS 88  BILITOT 0.5  PROT 7.4  ALBUMIN 3.3*   No results for input(s): LIPASE, AMYLASE in the last 168 hours. No results for input(s): AMMONIA in the last 168 hours. Coagulation Profile: Recent Labs  Lab 04/06/20 1639  INR 1.6*   Cardiac Enzymes: No results for input(s): CKTOTAL, CKMB, CKMBINDEX, TROPONINI in the last 168 hours. BNP (last 3 results) No results for input(s): PROBNP in the last 8760 hours. HbA1C: No results for input(s): HGBA1C in the last 72 hours. CBG: Recent Labs  Lab 04/07/20 0751  GLUCAP 102*   Lipid Profile: Recent Labs    04/07/20 0500  CHOL 65  HDL 46  LDLCALC 9  TRIG 51  CHOLHDL 1.4   Thyroid Function Tests: No results for input(s): TSH, T4TOTAL, FREET4, T3FREE, THYROIDAB in the last 72 hours. Anemia Panel: Recent Labs    04/05/20 1215  VITAMINB12 947*  FOLATE >100.0  FERRITIN 45  TIBC 396  IRON 34   Sepsis Labs: No results for input(s): PROCALCITON, LATICACIDVEN in the last 168 hours.  Recent Results (from the past 240 hour(s))  Resp Panel by RT-PCR (Flu A&B, Covid) Nasopharyngeal Swab     Status: None   Collection Time: 04/06/20  4:27 PM   Specimen: Nasopharyngeal Swab; Nasopharyngeal(NP) swabs in vial transport medium  Result Value Ref Range Status   SARS Coronavirus 2 by RT PCR NEGATIVE NEGATIVE Final    Comment: (NOTE) SARS-CoV-2 target nucleic acids are NOT DETECTED.  The SARS-CoV-2 RNA is generally detectable in upper respiratory specimens during the acute phase of infection. The lowest concentration of SARS-CoV-2 viral copies this assay can detect is 138 copies/mL. A  negative result does not preclude SARS-Cov-2 infection and should not be used as the sole basis for treatment or other patient management decisions. A negative result may occur with  improper specimen collection/handling, submission of specimen other than nasopharyngeal swab, presence of viral mutation(s) within the areas targeted by this assay, and inadequate number of viral copies(<138 copies/mL). A negative result must be combined with clinical observations, patient history, and epidemiological information. The expected result is Negative.  Fact Sheet for Patients:  EntrepreneurPulse.com.au  Fact Sheet for Healthcare Providers:  IncredibleEmployment.be  This test is no t yet approved or cleared by the [place] FDA and  has been authorized for detection and/or diagnosis of SARS-CoV-2 by FDA under an Emergency Use Authorization (EUA). This EUA will remain  in effect (meaning this test can be used) for the duration  of the COVID-19 declaration under Section 564(b)(1) of the Act, 21 U.S.C.section 360bbb-3(b)(1), unless the authorization is terminated  or revoked sooner.       Influenza A by PCR NEGATIVE NEGATIVE Final   Influenza B by PCR NEGATIVE NEGATIVE Final    Comment: (NOTE) The Xpert Xpress SARS-CoV-2/FLU/RSV plus assay is intended as an aid in the diagnosis of influenza from Nasopharyngeal swab specimens and should not be used as a sole basis for treatment. Nasal washings and aspirates are unacceptable for Xpert Xpress SARS-CoV-2/FLU/RSV testing.  Fact Sheet for Patients: EntrepreneurPulse.com.au  Fact Sheet for Healthcare Providers: IncredibleEmployment.be  This test is not yet approved or cleared by the [place] FDA and has been authorized for detection and/or diagnosis of SARS-CoV-2 by FDA under an Emergency Use Authorization (EUA). This EUA will remain in effect (meaning this test can  be used) for the duration of the COVID-19 declaration under Section 564(b)(1) of the Act, 21 U.S.C. section 360bbb-3(b)(1), unless the authorization is terminated or revoked.  Performed at [facility], [address] [place], [place] [address]          Radiology Studies: ECHOCARDIOGRAM COMPLETE BUBBLE STUDY  Result Date: 04/07/2020    ECHOCARDIOGRAM REPORT   Patient Name:   CHASTA DAUN Date of Exam: 04/07/2020 Medical Rec #:  [id number]       Height:       65.0 in Accession #:    [id number]      Weight:       220.0 lb Date of Birth:  July 18, 1950       BSA:          2.060 m Patient Age:    41 years        BP:           121/66 mmHg Patient Gender: F               HR:           59 bpm. Exam Location:  [facility] Procedure: 2D Echo, Saline Contrast Bubble Study, Limited Color Doppler and            Cardiac Doppler Indications:     I63.9 Stroke  History:         Patient has prior history of Echocardiogram examinations, most                  recent 09/03/2019. PVD, Arrythmias:P afib; Risk Factors:Sleep                  Apnea, Dyslipidemia and Hypertension.  Sonographer:     Charmayne Sheer RDCS (AE) Referring Phys:  [id number] AMY N COX Diagnosing Phys: Kate Sable MD  Sonographer Comments: No apical window and no subcostal window. Image acquisition challenging due to patient body habitus. IMPRESSIONS  1. Left ventricular ejection fraction, by estimation, is 60 to 65%. The left ventricle has normal function. The left ventricle has no regional wall motion abnormalities. Left ventricular diastolic function could not be evaluated.  2. Right ventricular systolic function is normal. The right ventricular size is not well visualized.  3. The mitral valve is degenerative. Mild mitral valve regurgitation.  4. Tricuspid valve regurgitation is mild to moderate.  5. The aortic valve is tricuspid. Aortic valve regurgitation is not visualized.  6. Agitated saline contrast bubble study was negative, with no  evidence of any interatrial shunt. FINDINGS  Left Ventricle: Left ventricular ejection fraction, by estimation, is 60 to 65%. The left ventricle  has normal function. The left ventricle has no regional wall motion abnormalities. The left ventricular internal cavity size was normal in size. There is  no left ventricular hypertrophy. Left ventricular diastolic function could not be evaluated. Right Ventricle: The right ventricular size is not well visualized. No increase in right ventricular wall thickness. Right ventricular systolic function is normal. Left Atrium: Left atrial size was normal in size. Right Atrium: Right atrial size was not well visualized. Pericardium: There is no evidence of pericardial effusion. Mitral Valve: The mitral valve is degenerative in appearance. Mild to moderate mitral annular calcification. Mild mitral valve regurgitation. Tricuspid Valve: The tricuspid valve is normal in structure. Tricuspid valve regurgitation is mild to moderate. Aortic Valve: The aortic valve is tricuspid. Aortic valve regurgitation is not visualized. Pulmonic Valve: The pulmonic valve was normal in structure. Pulmonic valve regurgitation is not visualized. Aorta: The aortic root and ascending aorta are structurally normal, with no evidence of dilitation. Venous: The inferior vena cava was not well visualized. IAS/Shunts: No atrial level shunt detected by color flow Doppler. Agitated saline contrast was given intravenously to evaluate for intracardiac shunting. Agitated saline contrast bubble study was negative, with no evidence of any interatrial shunt.  LEFT VENTRICLE PLAX 2D LVIDd:         4.40 cm LVIDs:         2.60 cm LV PW:         1.20 cm LV IVS:        0.90 cm LVOT diam:     1.80 cm LVOT Area:     2.54 cm  LEFT ATRIUM         Index LA diam:    4.70 cm 2.28 cm/m                        PULMONIC VALVE AORTA                 PV Vmax:       1.21 m/s Ao Root diam: 3.00 cm PV Vmean:      84.100 cm/s                        PV VTI:        0.293 m                       PV Peak grad:  5.9 mmHg                       PV Mean grad:  3.0 mmHg  TRICUSPID VALVE TR Peak grad:   37.2 mmHg TR Vmax:        305.00 cm/s  SHUNTS Systemic Diam: 1.80 cm Kate Sable MD Electronically signed by Kate Sable MD Signature Date/Time: 04/07/2020/9:26:31 AM    Final         Scheduled Meds: .  stroke: mapping our early stages of recovery book   Does not apply Once  . amLODipine  5 mg Oral Daily  . apixaban  5 mg Oral BID  . buPROPion  150 mg Oral Daily  . cholecalciferol  1,000 Units Oral BID WC  . DULoxetine  60 mg Oral Daily  . folic acid  1 mg Oral Daily  . losartan  50 mg Oral Daily   And  . hydrochlorothiazide  12.5 mg Oral Daily  . ipratropium-albuterol  3 mL Nebulization Q6H  .  ipratropium-albuterol  3 mL Nebulization Q6H  . levothyroxine  175 mcg Oral Q0600  . linagliptin  5 mg Oral Daily  . metFORMIN  500 mg Oral Q breakfast  . metoprolol succinate  50 mg Oral Daily  . mometasone-formoterol  2 puff Inhalation BID  . multivitamin with minerals  1 tablet Oral Daily  . rosuvastatin  20 mg Oral Daily  . thiamine  50 mg Oral BID WC  . cyanocobalamin  1,000 mcg Oral Daily  . [START ON 04/12/2020] Vitamin D (Ergocalciferol)  50,000 Units Oral Weekly   Continuous Infusions:   LOS: 0 days    Time spent: 28 minutes    Sharen Hones, MD [facility]   To contact the attending provider between 7A-7P or the covering provider during after hours 7P-7A, please log into the web site [email or web address] and access using universal [place] password for that web site. If you do not have the password, please call the hospital operator.  04/07/2020, 12:51 PM

## 2020-04-07 NOTE — ED Notes (Signed)
Patient denies pain and is resting comfortably.  

## 2020-04-07 NOTE — Progress Notes (Signed)
*  PRELIMINARY RESULTS* Echocardiogram 2D Echocardiogram has been performed.  [name] 04/07/2020, 8:57 AM

## 2020-04-07 NOTE — ED Notes (Signed)
Please call husband [name] @ [phone] with an update.

## 2020-04-07 NOTE — ED Notes (Signed)
Rounds performed. Pt resting in bed and in no acute distress at this time.

## 2020-04-07 NOTE — Evaluation (Signed)
Occupational Therapy Evaluation Patient Details Name: KIFFANY SCHELLING MRN: [id number] DOB: [birth year] Today's Date: 04/07/2020    History of Present Illness Pt is a 70 y/o F admitted on 04/06/20 after presenting to ED for further evaluation of a new stroke by guidance of her PCP. MRI revealed recurrent stroke/acute punctate focus of restricted diffusion in the right external capsule region. PMH: CVA (October & November 2021), HTN, DM2, morbid obesity, chronic pain, paroxysmal a-fib, DVT, OSA on CPAP, raynaud's phenomenon, anxiety, depression, asthma, cystocele, dyspnea, GERD, heart murmur, IBS, HLD, BLE neuropathy, obesity, PVD, urinary incontinence   Clinical Impression   Ms Buchner was seen for OT/PT co-evaluation this date. Pt is well known to OT from prior admissions and continues to present as pain limited in BLE for mobility assessment. Pt reports she was ambulating until a few days before Christmas when she began experiencing pain in BLE & pt & husband began using hoyer lift for bed<> BSC. Pt presents to acute OT demonstrating impaired ADL performance and functional mobility 2/2 decreased activity tolerance, pain, and fucntional balance/strength/ROM deficits.  Pt currently requires MIN A doff R sock seated EOB. MAX A don B socks seated EOB. SETUP seated grooming/UBD. MAX A X3 perihygiene in standing. Pt would benefit from skilled OT to address noted impairments and functional limitations (see below for any additional details) in order to maximize safety and independence while minimizing falls risk and caregiver burden. Upon hospital discharge, recommend STR to maximize pt safety and return to PLOF.     Follow Up Recommendations  SNF    Equipment Recommendations  None recommended by OT    Recommendations for Other Services       Precautions / Restrictions Precautions Precautions: Fall Precaution Comments: per last admission, recommendation to minimize weight bearing through B heels  (pt used personal wedge shoes from home) Restrictions Weight Bearing Restrictions: No      Mobility Bed Mobility Overal bed mobility: Needs Assistance Bed Mobility: Supine to Sit;Sit to Supine     Supine to sit: Max assist;+2 for physical assistance;HOB elevated Sit to supine: Max assist;+2 for physical assistance;HOB elevated   General bed mobility comments: Pt is able to initiate moving BLE to EOB but requires MAX assist +2 to upright trunk to sitting position. Assistance to lower trunk & elevate BLE with sit>supine.    Transfers Overall transfer level: Needs assistance Equipment used: 2 person hand held assist Transfers: Sit to/from Stand Sit to Stand: Max assist;+2 physical assistance         General transfer comment: PT/OT blocking BLE knees, using sheet under buttocks to assist with clearing bottom from EOB, max multimodal cuing for anterior weight shifting & sequencing, multiple attempts to clear rear from EOB but unable to come to fully upright into standing. Pt limited by pain in BLE with weight bearing even with anterior weight shifting in sitting.    Balance Overall balance assessment: Needs assistance Sitting-balance support: Bilateral upper extremity supported;Feet supported Sitting balance-Leahy Scale: Fair Sitting balance - Comments: supervision static sitting balance     Standing balance-Leahy Scale: Zero                             ADL either performed or assessed with clinical judgement   ADL Overall ADL's : Needs assistance/impaired  General ADL Comments: MIN A doff R sock seated EOB. MAX A don B socks seated EOB. SETUP seated grooming/UBD. MAX A X3 perihygiene in standing                  Pertinent Vitals/Pain Pain Assessment: Faces Faces Pain Scale: Hurts whole lot Pain Location: BLE Pain Descriptors / Indicators: Grimacing;Discomfort Pain Intervention(s): Limited activity  within patient's tolerance;Repositioned     Hand Dominance Right   Extremity/Trunk Assessment Upper Extremity Assessment Upper Extremity Assessment: LUE deficits/detail LUE Deficits / Details: Limited ROM and pain with range from prior CVA - pt anxious with therapist hands near LUE   Lower Extremity Assessment Lower Extremity Assessment: Generalized weakness   Cervical / Trunk Assessment Cervical / Trunk Assessment: Kyphotic   Communication Communication Communication: No difficulties   Cognition Arousal/Alertness: Awake/alert Behavior During Therapy: Anxious Overall Cognitive Status: Within Functional Limits for tasks assessed                                     General Comments  Anxious throughout session, somewhat tearful after attempting sit<>stand transfer    Exercises Exercises: Other exercises Other Exercises Other Exercises: Pt educated re: OT role, DME recs, d/c recs, falls prevention, ECS Other Exercises: LBD, sup<>sit, sit<>stand, sitting/standing balance/tolerance, UBD   Shoulder Instructions      Home Living Family/patient expects to be discharged to:: Private residence Living Arrangements: Spouse/significant other Available Help at Discharge: Family;Available 24 hours/day Type of Home: House Home Access: Ramped entrance     Home Layout: One level     Bathroom Shower/Tub: Teacher, early years/pre: Handicapped height     Home Equipment: Environmental consultant - 2 wheels;Walker - 4 wheels;Bedside commode;Wheelchair - manual;Other (comment) (hoyer lift)          Prior Functioning/Environment Level of Independence: Needs assistance        Comments: Pt reports she was walking a few days prior to Christmas then was unable to stand on BLE. Pt reports husband assists her to BSC/wheelchair with use of hoyer lift but she attempts to stand daily with his assistance. Pt notes receiving 6 HHPT visits. Due to recent events, pt was using EMS  transport to Dr. appointments.        OT Problem List: Decreased strength;Decreased activity tolerance;Impaired balance (sitting and/or standing);Decreased safety awareness      OT Treatment/Interventions: Self-care/ADL training;Therapeutic exercise;Energy conservation;DME and/or AE instruction;Therapeutic activities;Patient/family education;Balance training    OT Goals(Current goals can be found in the care plan section) Acute Rehab OT Goals Patient Stated Goal: to walk again OT Goal Formulation: With patient Time For Goal Achievement: 04/21/20 Potential to Achieve Goals: Fair ADL Goals Pt Will Perform Grooming: sitting;with supervision;with set-up Pt Will Perform Lower Body Dressing: with min assist;sitting/lateral leans Pt Will Transfer to Toilet: with max assist;squat pivot transfer;bedside commode;with +2 assist (c LRAD PRN)  OT Frequency: Min 1X/week   Barriers to D/C: Decreased caregiver support          Co-evaluation PT/OT/SLP Co-Evaluation/Treatment: Yes Reason for Co-Treatment: For patient/therapist safety;To address functional/ADL transfers PT goals addressed during session: Mobility/safety with mobility OT goals addressed during session: ADL's and self-care      AM-PAC OT "6 Clicks" Daily Activity     Outcome Measure Help from another person eating meals?: A Little Help from another person taking care of personal grooming?: A Little Help from another person toileting, which  includes using toliet, bedpan, or urinal?: A Lot Help from another person bathing (including washing, rinsing, drying)?: A Lot Help from another person to put on and taking off regular upper body clothing?: A Little Help from another person to put on and taking off regular lower body clothing?: A Lot 6 Click Score: 15   End of Session    Activity Tolerance: Patient limited by pain;Patient limited by fatigue Patient left: in bed;with call bell/phone within reach;Other (comment) (MD in  room)  OT Visit Diagnosis: Other abnormalities of gait and mobility (R26.89);Muscle weakness (generalized) (M62.81)                Time: 8032-1224 OT Time Calculation (min): 23 min Charges:  OT General Charges $OT Visit: 1 Visit OT Evaluation $OT Eval Low Complexity: 1 Low  Dessie Coma, M.S. OTR/L  04/07/20, 2:47 PM  ascom [phone]

## 2020-04-07 NOTE — Evaluation (Signed)
Physical Therapy Evaluation Patient Details Name: [name] MRN: [id number] DOB: 29-Jan-1951 Today's Date: 04/07/2020   History of Present Illness  Pt is a 70 y/o F admitted on 04/06/20 after presenting to ED for further evaluation of a new stroke by guidance of her PCP. MRI revealed recurrent stroke/acute punctate focus of restricted diffusion in the right external capsule region. PMH: CVA (October & November 2021), HTN, DM2, morbid obesity, chronic pain, paroxysmal a-fib, DVT, OSA on CPAP, raynaud's phenomenon, anxiety, depression, asthma, cystocele, dyspnea, GERD, heart murmur, IBS, HLD, BLE neuropathy, obesity, PVD, urinary incontinence  Clinical Impression  Pt seen by PT/OT for co-evaluation for pt & therapist's safety. Pt reports she was ambulating until a few days before Christmas when she began experiencing pain in BLE & pt & husband began using hoyer lift for bed<>w/c or BSC. Pt notes pain in BLE but is unable to describe or rate on this date. Pt demonstrates significant weakness in BLE hip flexors & extensors & glutes. Pt agreeable to attempting sit<>stand and unable to achieve with use of gait belt & max assist +2 HHA. PT places sheet underneath buttocks and pt has increased luck of sit>stand with max assist +2 with this technique. Pt is able to clear buttocks from EOB but unable to achieve full upright standing. Pt demonstrates decreased anterior weight shifting to assist with transfers as well, despite PT/OT attempting to facilitate. Pt will require 24 hr assist upon d/c and currently recommending STR prior to return home as pt is eager to ambulate again & was ambulating only a month ago. Will continue to follow pt acutely to progress transfers & gait as able, as well as BLE strengthening.    Follow Up Recommendations SNF;Supervision/Assistance - 24 hour    Equipment Recommendations  None recommended by PT    Recommendations for Other Services       Precautions / Restrictions  Precautions Precautions: Fall Precaution Comments: per last admission, recommendation to minimize weight bearing through B heels (pt used personal wedge shoes from home) Restrictions Weight Bearing Restrictions: No      Mobility  Bed Mobility Overal bed mobility: Needs Assistance Bed Mobility: Supine to Sit;Sit to Supine     Supine to sit: Max assist;HOB elevated;+2 for physical assistance Sit to supine: Max assist;+2 for physical assistance;HOB elevated   General bed mobility comments: Pt is able to initiate moving BLE to EOB but requires MAX assist +2 to upright trunk to sitting position. Assistance to lower trunk & elevate BLE with sit>supine.    Transfers Overall transfer level: Needs assistance Equipment used: 2 person hand held assist Transfers: Sit to/from Stand Sit to Stand: Max assist;+2 physical assistance         General transfer comment: PT/OT blocking BLE knees, using sheet under buttocks to assist with clearing bottom from EOB, max multimodal cuing for anterior weight shifting & sequencing, multiple attempts to achieve clearance from EOB but unable to come to full upright standing. Pt limited by pain in BLE with weight bearing even with anterior weight shifting in sitting.  Ambulation/Gait                Stairs            Wheelchair Mobility    Modified Rankin (Stroke Patients Only)       Balance Overall balance assessment: Needs assistance Sitting-balance support: Bilateral upper extremity supported;Feet supported Sitting balance-Leahy Scale: Fair Sitting balance - Comments: supervision static sitting balance     Standing  balance-Leahy Scale: Zero                               Pertinent Vitals/Pain Pain Assessment: Faces Faces Pain Scale: Hurts whole lot Pain Location: BLE Pain Descriptors / Indicators:  ("it's just pain") Pain Intervention(s): Monitored during session;Limited activity within patient's tolerance     Home Living Family/patient expects to be discharged to:: Private residence Living Arrangements: Spouse/significant other Available Help at Discharge: Family;Available 24 hours/day Type of Home: House Home Access: Ramped entrance     Home Layout: One level Home Equipment: Walker - 2 wheels;Walker - 4 wheels;Bedside commode;Wheelchair - manual (hoyer lift)      Prior Function Level of Independence: Needs assistance         Comments: Pt reports she was ambulation a few days prior to Christmas then was unable to stand on BLE. Pt reports husband assists her to BSC/wheelchair with use of hoyer lift but she attempts to stand daily with his assistance. Pt notes receiving 6 HHPT visits. Due to recent events, pt was using EMS transport to Dr. appointments.     Hand Dominance   Dominant Hand: Right    Extremity/Trunk Assessment   Upper Extremity Assessment Upper Extremity Assessment: Generalized weakness    Lower Extremity Assessment Lower Extremity Assessment: Generalized weakness (1/5 RLE hip flexion in sitting, 2/5 LLE hip flexion in sitting, 3/5 BLE knee extension (no overpressure attempted). Pt with redness to distal BLE, dark tissue on front of R shin.)     Pt with no hemiparesis observed. Unable to perform BLE heel to shin 2/2 weakness.  Cervical / Trunk Assessment Cervical / Trunk Assessment: Kyphotic (posterior pelvic tilt)  Communication   Communication: No difficulties  Cognition Arousal/Alertness: Awake/alert Behavior During Therapy: Anxious Overall Cognitive Status: Within Functional Limits for tasks assessed                                        General Comments General comments (skin integrity, edema, etc.): Anxious throughout session, somewhat tearful after attempting sit<>stand transfer    Exercises     Assessment/Plan    PT Assessment Patient needs continued PT services  PT Problem List Decreased strength;Decreased  mobility;Obesity;Decreased knowledge of precautions;Decreased coordination;Decreased range of motion;Cardiopulmonary status limiting activity;Decreased activity tolerance;Decreased skin integrity;Pain;Impaired sensation;Decreased knowledge of use of DME;Decreased balance       PT Treatment Interventions DME instruction;Therapeutic exercise;Wheelchair mobility training;Gait training;Balance training;Stair training;Neuromuscular re-education;Functional mobility training;Patient/family education;Therapeutic activities    PT Goals (Current goals can be found in the Dawn Plan section)  Acute Rehab PT Goals Patient Stated Goal: to walk again PT Goal Formulation: With patient Time For Goal Achievement: 04/21/20 Potential to Achieve Goals: Fair    Frequency Min 2X/week   Barriers to discharge Decreased caregiver support pt with difficulty physically assisting pt    Co-evaluation PT/OT/SLP Co-Evaluation/Treatment: Yes Reason for Co-Treatment: For patient/therapist safety;To address functional/ADL transfers PT goals addressed during session: Mobility/safety with mobility;Balance OT goals addressed during session: Strengthening/ROM;ADL's and self-Dawn       AM-PAC PT "6 Clicks" Mobility  Outcome Measure Help needed turning from your back to your side while in a flat bed without using bedrails?: Total Help needed moving from lying on your back to sitting on the side of a flat bed without using bedrails?: Total Help needed moving to and from a  bed to a chair (including a wheelchair)?: Total Help needed standing up from a chair using your arms (e.g., wheelchair or bedside chair)?: Total Help needed to walk in hospital room?: Total Help needed climbing 3-5 steps with a railing? : Total 6 Click Score: 6    End of Session Equipment Utilized During Treatment: Gait belt Activity Tolerance: Patient limited by pain Patient left: in bed;with call bell/phone within reach (MD in room, BLE elevated on  towel roll)   PT Visit Diagnosis: Difficulty in walking, not elsewhere classified (R26.2);Muscle weakness (generalized) (M62.81);Pain Pain - Right/Left:  (BLE) Pain - part of body: Ankle and joints of foot    Time: 1032-1056 PT Time Calculation (min) (ACUTE ONLY): 24 min   Charges:   PT Evaluation $PT Eval Low Complexity: 1 Low PT Treatments $Therapeutic Activity: 8-22 mins        [name], PT, DPT 04/07/20, 2:07 PM   [name] 04/07/2020, 2:01 PM

## 2020-04-07 NOTE — Consult Note (Signed)
WOC Nurse Consult Note:  Received consult for care of bilateral feet.  A request for photographs of the patient's feet has been submitted to Dr. Roosevelt Locks via Secure Chart.  Consult pending photograph.  Thanks, Maudie Flakes, MSN, RN, [place], Arther Abbott  Pager# [phone]

## 2020-04-07 NOTE — TOC Initial Note (Addendum)
Transition of Care [facility]) - Initial/Assessment Note    Patient Details  Name: [name] MRN: [id number] Date of Birth: 04-28-1950  Transition of Care [facility]) CM/SW Contact:    Elliot Gurney [place], [place] Phone Number: 04/07/2020, 3:37 PM  Clinical Narrative:                 Patient is a 70 y/o Female who presented to ED for further evaluation of a new stroke by guidance of her PCP. Patient resides in her own home with her spouse. Spouse very attentive and available to provide care. However uses EMS transport to get to her medical appointments due to her limited mobility.  Patient has a walker, hoyer lift, wheelchair, and trapeze bar in the home. This Education officer, museum spoke with patient's spouse by phone to discuss recommendation for SNF. Patient's spouse aware of recommendation and both agree with plan. SNF process discussed, Fl2 completed and faxed out for bed offers. First choice would be [facility], however is willing to have her information sent to additional facilities as well.  Transition of Care to continue to follow     [facility], LCSW Transition of Care [phone]   Expected Discharge Plan: [place] Barriers to Discharge: No Barriers Identified   Patient Goals and CMS Choice        Expected Discharge Plan and Services Expected Discharge Plan: [place] In-house Referral: Clinical Social Work     Living arrangements for the past 2 months: Single Family Home                                      Prior Living Arrangements/Services Living arrangements for the past 2 months: Single Family Home Lives with:: Self Patient language and need for interpreter reviewed:: Yes        Need for Family Participation in Patient Care: Yes (Comment) Care giver support system in place?: Yes (comment)   Criminal Activity/Legal Involvement Pertinent to Current Situation/Hospitalization: No - Comment as needed  Activities of Daily  Living      Permission Sought/Granted                  Emotional Assessment         Alcohol / Substance Use: Not Applicable Psych Involvement: No (comment)  Admission diagnosis:  Stroke [facility]) [H47.6] Acute embolic stroke [facility]) [L46.5] Patient Active Problem List   Diagnosis Date Noted  . Persistent atrial fibrillation ([place]) 04/04/2020  . Bedbound 03/29/2020  . B12 deficiency 03/11/2020  . Ulcer of left heel and midfoot ([place]) 03/11/2020  . PAD (peripheral artery disease) ([place]) 03/11/2020  . Rotator cuff disorder, left 03/01/2020  . Depression, recurrent ([place]) 03/01/2020  . Cerebrovascular accident (CVA) ([place]) 03/01/2020  . Primary osteoarthritis of left shoulder 02/12/2020  . Folate deficiency 02/08/2020  . Vitamin D deficiency 02/08/2020  . Malnutrition of moderate degree 02/06/2020  . Stroke ([place]) 02/05/2020  . Left arm pain 01/03/2020  . Left hemiparesis ([place]) 12/28/2019  . Hypertension associated with diabetes ([place]) 12/26/2019  . Acute embolic stroke ([place]) 03/54/6568  . Chronic bacteriuria 11/17/2019  . Hyperkalemia 09/14/2019  . Bacteremia due to Gram-positive bacteria 09/08/2019  . Pressure injury of skin 09/04/2019  . Elevated troponin 09/02/2019  . COPD with acute exacerbation ([place]) 09/02/2019  . Acute respiratory failure with hypoxia ([place]) 09/02/2019  . Pharmacologic therapy 08/30/2019  . Recurrent falls 07/10/2019  .  Left ankle pain 06/02/2019  . Chronic pain of right ankle 02/14/2019  . Arthritis 02/14/2019  . Nausea and vomiting 02/14/2019  . Meralgia paresthetica of right side 12/05/2018  . Chronic anticoagulation ([place]) 12/05/2018  . Peripheral vascular disease ([place]) 10/18/2018  . Chronic ulcer of right leg ([place]) 10/18/2018  . Fibromyalgia 08/17/2018  . Chronic musculoskeletal pain 08/17/2018  . Tremor of right hand 08/03/2018  . Overactive bladder 08/03/2018  . Physical deconditioning 08/03/2018  . Foot ulcer, left ([place]) 03/09/2018  .  Adrenal mass ([place]) 08/02/2017  . Eczema 06/27/2017  . Diabetic ulcer of left foot ([place]) 06/24/2017  . Mass of both adrenal glands ([place]) 05/16/2017  . Fatty liver 05/13/2017  . Strain of right hip 04/30/2017  . Ischial pain, right 04/30/2017  . Pain of left hip joint 04/22/2017  . Abnormality of gait and mobility 04/22/2017  . Muscle strain of left hip 04/09/2017  . Anemia 11/18/2016  . Asthma 11/18/2016  . Carpal tunnel syndrome 11/18/2016  . Pain in joint involving ankle and foot 11/18/2016  . Obstructive sleep apnea of adult 11/18/2016  . Spinal stenosis of thoracic region 11/18/2016  . Abscess of tendon of left foot 09/21/2016  . Cubital tunnel syndrome on right 08/07/2016  . Chronic pain syndrome 02/25/2016  . Long term current use of opiate analgesic 02/25/2016  . Chronic right shoulder pain 02/25/2016  . Status post reverse total shoulder replacement 10/08/2015  . 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 Morbid obesity with BMI of 40.0-44.9, adult ([place]) 09/23/2015  . Chronic prescription benzodiazepine use 09/23/2015  . Arthralgia 09/23/2015  . Osteoarthritis involving multiple joints 09/23/2015  . Other shoulder lesions (Right) 08/28/2015  . Cervical radiculitis 08/06/2015  . Fall 05/22/2015  . Opiate use (15 MME/Day) 01/16/2015  . Impingement syndrome of shoulder region 01/07/2015  . Mixed incontinence 11/26/2014  . Atrophic vaginitis 11/26/2014  . Anxiety and depression 09/21/2014  . Bladder cystocele 09/21/2014  . Gastro-esophageal reflux disease without esophagitis 09/21/2014  . DM (diabetes mellitus) type II, controlled, with peripheral vascular disorder ([place]) 08/31/2014  . Diabetic peripheral neuropathy associated with type 2 diabetes mellitus ([place]) 08/31/2014  . Asthma, well controlled 08/31/2014  . Hypothyroidism, unspecified 08/31/2014  . Peripheral vascular disease due to secondary diabetes mellitus ([place]) 12/11/2013  . OSA on CPAP  10/19/2013  . AF (paroxysmal atrial fibrillation) ([place]) 07/27/2013  . Essential hypertension 07/27/2013  . Chronic atrial fibrillation 08/10/2012   PCP:  [name], [name], MD Pharmacy:   [place], [place] - [address] [place] [address] Phone: [phone] Fax: [phone]     Social Determinants of Health (SDOH) Interventions    Readmission Risk Interventions Readmission Risk Prevention Plan 07/30/2018  Transportation Screening Complete  PCP or Specialist Appt within 3-5 Days Complete  HRI or [place] Complete  Social Work Consult for [place] Planning/Counseling Patient refused  Palliative Care Screening Patient Refused  Medication Review Press photographer) Complete  Some recent data might be hidden

## 2020-04-07 NOTE — NC FL2 (Signed)
[place] LEVEL OF CARE SCREENING TOOL     IDENTIFICATION  Patient Name: [name] Birthdate: 1950-09-01 Sex: adult Admission Date (Current Location): 04/06/2020  [place] and [place] Number:  Engineering geologist and Address:  [facility], [address] [place],  [address]      Provider Number:    Attending Physician Name and Address:  Sharen Hones, MD  Relative Name and Phone Number:  quintasha gren [phone]    Current Level of Care: SNF Recommended Level of Care: [place] Prior Approval Number:    Date Approved/Denied:   PASRR Number: [id number]  Discharge Plan: SNF    Current Diagnoses: Patient Active Problem List   Diagnosis Date Noted  . Persistent atrial fibrillation ([place]) 04/04/2020  . Bedbound 03/29/2020  . B12 deficiency 03/11/2020  . Ulcer of left heel and midfoot ([place]) 03/11/2020  . PAD (peripheral artery disease) ([place]) 03/11/2020  . Rotator cuff disorder, left 03/01/2020  . Depression, recurrent ([place]) 03/01/2020  . Cerebrovascular accident (CVA) ([place]) 03/01/2020  . Primary osteoarthritis of left shoulder 02/12/2020  . Folate deficiency 02/08/2020  . Vitamin D deficiency 02/08/2020  . Malnutrition of moderate degree 02/06/2020  . Stroke ([place]) 02/05/2020  . Left arm pain 01/03/2020  . Left hemiparesis ([place]) 12/28/2019  . Hypertension associated with diabetes ([place]) 12/26/2019  . Acute embolic stroke ([place]) 73/22/0254  . Chronic bacteriuria 11/17/2019  . Hyperkalemia 09/14/2019  . Bacteremia due to Gram-positive bacteria 09/08/2019  . Pressure injury of skin 09/04/2019  . Elevated troponin 09/02/2019  . COPD with acute exacerbation ([place]) 09/02/2019  . Acute respiratory failure with hypoxia ([place]) 09/02/2019  . Pharmacologic therapy 08/30/2019  . Recurrent falls 07/10/2019  . Left ankle pain 06/02/2019  . Chronic pain of right ankle 02/14/2019  . Arthritis 02/14/2019  .  Nausea and vomiting 02/14/2019  . Meralgia paresthetica of right side 12/05/2018  . Chronic anticoagulation ([place]) 12/05/2018  . Peripheral vascular disease ([place]) 10/18/2018  . Chronic ulcer of right leg ([place]) 10/18/2018  . Fibromyalgia 08/17/2018  . Chronic musculoskeletal pain 08/17/2018  . Tremor of right hand 08/03/2018  . Overactive bladder 08/03/2018  . Physical deconditioning 08/03/2018  . Foot ulcer, left ([place]) 03/09/2018  . Adrenal mass ([place]) 08/02/2017  . Eczema 06/27/2017  . Diabetic ulcer of left foot ([place]) 06/24/2017  . Mass of both adrenal glands ([place]) 05/16/2017  . Fatty liver 05/13/2017  . Strain of right hip 04/30/2017  . Ischial pain, right 04/30/2017  . Pain of left hip joint 04/22/2017  . Abnormality of gait and mobility 04/22/2017  . Muscle strain of left hip 04/09/2017  . Anemia 11/18/2016  . Asthma 11/18/2016  . Carpal tunnel syndrome 11/18/2016  . Pain in joint involving ankle and foot 11/18/2016  . Obstructive sleep apnea of adult 11/18/2016  . Spinal stenosis of thoracic region 11/18/2016  . Abscess of tendon of left foot 09/21/2016  . Cubital tunnel syndrome on right 08/07/2016  . Chronic pain syndrome 02/25/2016  . Long term current use of opiate analgesic 02/25/2016  . Chronic right shoulder pain 02/25/2016  . Status post reverse total shoulder replacement 10/08/2015  . 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 Morbid obesity with BMI of 40.0-44.9, adult ([place]) 09/23/2015  . Chronic prescription benzodiazepine use 09/23/2015  . Arthralgia 09/23/2015  . Osteoarthritis involving multiple joints 09/23/2015  . Other shoulder lesions (Right) 08/28/2015  . Cervical radiculitis 08/06/2015  . Fall 05/22/2015  .  Opiate use (15 MME/Day) 01/16/2015  . Impingement syndrome of shoulder region 01/07/2015  . Mixed incontinence 11/26/2014  . Atrophic vaginitis 11/26/2014  . Anxiety and depression 09/21/2014  . Bladder cystocele  09/21/2014  . Gastro-esophageal reflux disease without esophagitis 09/21/2014  . DM (diabetes mellitus) type II, controlled, with peripheral vascular disorder (HCC) 08/31/2014  . Diabetic peripheral neuropathy associated with type 2 diabetes mellitus (HCC) 08/31/2014  . Asthma, well controlled 08/31/2014  . Hypothyroidism, unspecified 08/31/2014  . Peripheral vascular disease due to secondary diabetes mellitus (HCC) 12/11/2013  . OSA on CPAP 10/19/2013  . AF (paroxysmal atrial fibrillation) (HCC) 07/27/2013  . Essential hypertension 07/27/2013  . Chronic atrial fibrillation 08/10/2012    Orientation RESPIRATION BLADDER Height & Weight     Self,Time,Situation,Place  Normal Incontinent Weight:   Height:     BEHAVIORAL SYMPTOMS/MOOD NEUROLOGICAL BOWEL NUTRITION STATUS      Incontinent Diet  AMBULATORY STATUS COMMUNICATION OF NEEDS Skin   Extensive Assist Verbally Normal                       Personal Care Assistance Level of Assistance  Bathing,Feeding,Dressing Bathing Assistance: Maximum assistance Feeding assistance: Independent Dressing Assistance: Maximum assistance     Functional Limitations Info  Sight,Hearing,Speech Sight Info: Adequate Hearing Info: Adequate Speech Info: Adequate    SPECIAL CARE FACTORS FREQUENCY  PT (By licensed PT),OT (By licensed OT)     PT Frequency: 5x per week OT Frequency: 5x per week            Contractures Contractures Info: Not present    Additional Factors Info  Code Status Code Status Info: full code             Current Medications (04/07/2020):  This is the current hospital active medication list Current Facility-Administered Medications  Medication Dose Route Frequency Provider Last Rate Last Admin  .  stroke: mapping our early stages of recovery book   Does not apply Once Cox, Amy N, DO      . acetaminophen (TYLENOL) tablet 650 mg  650 mg Oral Q4H PRN Cox, Amy N, DO   650 mg at 04/07/20 0133   Or  .  acetaminophen (TYLENOL) 160 MG/5ML solution 650 mg  650 mg Per Tube Q4H PRN Cox, Amy N, DO       Or  . acetaminophen (TYLENOL) suppository 650 mg  650 mg Rectal Q4H PRN Cox, Amy N, DO      . albuterol (PROVENTIL) (2.5 MG/3ML) 0.083% nebulizer solution 2.5 mg  2.5 mg Nebulization Q4H PRN Cox, Amy N, DO      . ALPRAZolam (XANAX) tablet 0.5 mg  0.5 mg Oral QHS PRN Cox, Amy N, DO      . amLODipine (NORVASC) tablet 5 mg  5 mg Oral Daily Cox, Amy N, DO   5 mg at 04/07/20 0956  . apixaban (ELIQUIS) tablet 5 mg  5 mg Oral BID Cox, Amy N, DO   5 mg at 04/07/20 1208  . bacitracin ointment   Topical BID Marrion Coy, MD      . buPROPion (WELLBUTRIN XL) 24 hr tablet 150 mg  150 mg Oral Daily Cox, Amy N, DO   150 mg at 04/07/20 1209  . cholecalciferol (VITAMIN D3) tablet 1,000 Units  1,000 Units Oral BID WC Cox, Amy N, DO   1,000 Units at 04/07/20 1209  . DULoxetine (CYMBALTA) DR capsule 60 mg  60 mg Oral Daily Cox, Amy N, DO  60 mg at 04/07/20 1209  . fesoterodine (TOVIAZ) tablet 8 mg  8 mg Oral Daily Sharen Hones, MD      . folic acid (FOLVITE) tablet 1 mg  1 mg Oral Daily Cox, Amy N, DO   1 mg at 04/07/20 1209  . losartan (COZAAR) tablet 50 mg  50 mg Oral Daily Cox, Amy N, DO   50 mg at 04/07/20 1209   And  . hydrochlorothiazide (MICROZIDE) capsule 12.5 mg  12.5 mg Oral Daily Cox, Amy N, DO   12.5 mg at 04/07/20 1209  . ipratropium-albuterol (DUONEB) 0.5-2.5 (3) MG/3ML nebulizer solution 3 mL  3 mL Nebulization Q6H [name], [name], MD      . ipratropium-albuterol (DUONEB) 0.5-2.5 (3) MG/3ML nebulizer solution 3 mL  3 mL Nebulization Q6H Cox, Amy N, DO   3 mL at 04/07/20 1007  . levothyroxine (SYNTHROID) tablet 175 mcg  175 mcg Oral Q0600 Cox, Amy N, DO   175 mcg at 04/07/20 0711  . linagliptin (TRADJENTA) tablet 5 mg  5 mg Oral Daily Cox, Amy N, DO   5 mg at 04/07/20 1210  . metFORMIN (GLUCOPHAGE) tablet 500 mg  500 mg Oral Q breakfast Cox, Amy N, DO   500 mg at 04/07/20 0956  . metoprolol  succinate (TOPROL-XL) 24 hr tablet 50 mg  50 mg Oral Daily Cox, Amy N, DO   50 mg at 04/07/20 1208  . mometasone-formoterol (DULERA) 200-5 MCG/ACT inhaler 2 puff  2 puff Inhalation BID Cox, Amy N, DO   2 puff at 04/06/20 2150  . multivitamin with minerals tablet 1 tablet  1 tablet Oral Daily Cox, Amy N, DO   1 tablet at 04/07/20 0956  . oxybutynin (DITROPAN-XL) 24 hr tablet 10 mg  10 mg Oral QHS Sharen Hones, MD      . rosuvastatin (CRESTOR) tablet 20 mg  20 mg Oral Daily Cox, Amy N, DO   20 mg at 04/07/20 0956  . thiamine tablet 50 mg  50 mg Oral BID WC Cox, Amy N, DO   50 mg at 04/07/20 1209  . vitamin B-12 (CYANOCOBALAMIN) tablet 1,000 mcg  1,000 mcg Oral Daily Cox, Amy N, DO   1,000 mcg at 04/07/20 0956  . [START ON 04/12/2020] Vitamin D (Ergocalciferol) (DRISDOL) capsule 50,000 Units  50,000 Units Oral Weekly Cox, Amy N, DO       Current Outpatient Medications  Medication Sig Dispense Refill  . acetaminophen (TYLENOL) 325 MG tablet Take 2 tablets (650 mg total) by mouth every 6 (six) hours as needed for mild pain or moderate pain (or Fever >/= 101).    Marland Kitchen albuterol (PROVENTIL) (2.5 MG/3ML) 0.083% nebulizer solution Take 2.5 mg by nebulization every 4 (four) hours as needed for wheezing or shortness of breath.    . ALPRAZolam (XANAX) 0.5 MG tablet Take 0.5 mg by mouth at bedtime as needed for anxiety or sleep.    Marland Kitchen amLODipine (NORVASC) 5 MG tablet Take 1 tablet (5 mg total) by mouth daily. 90 tablet 1  . apixaban (ELIQUIS) 5 MG TABS tablet Take 1 tablet (5 mg total) by mouth 2 (two) times daily. 180 tablet 2  . buPROPion (WELLBUTRIN XL) 150 MG 24 hr tablet Take 1 tablet (150 mg total) by mouth daily. 90 tablet 3  . calcium-vitamin D (OSCAL WITH D) 500-200 MG-UNIT tablet Take 1 tablet by mouth 2 (two) times daily with a meal. 60 tablet 0  . cholecalciferol (VITAMIN D) 25 MCG tablet  Take 1 tablet (1,000 Units total) by mouth 2 (two) times daily with a meal. 30 tablet 2  . clobetasol cream  (TEMOVATE) 0.05 % Apply 1 application topically 2 (two) times daily as needed (skin irritation).     . colchicine 0.6 MG tablet Take 0.6 mg by mouth 2 (two) times daily as needed (acute gout flare).     . diclofenac Sodium (VOLTAREN) 1 % GEL Apply 2 g topically 3 (three) times daily. 100 g 2  . DULoxetine (CYMBALTA) 60 MG capsule Take 1 capsule (60 mg total) by mouth daily. 30 capsule 2  . ergocalciferol (VITAMIN D2) 1.25 MG (50000 UT) capsule Take 50,000 Units by mouth once a week.    . estradiol (ESTRACE) 0.1 MG/GM vaginal cream Place 1 Applicatorful vaginally every Monday, Wednesday, and Friday. Apply 0.5mg  (pea-sized amount)  just inside the vaginal introitus with a finger-tip on Monday, Wednesday and Friday nights, 42.5 g 11  . fluticasone-salmeterol (ADVAIR HFA) 115-21 MCG/ACT inhaler Inhale 2 puffs into the lungs daily. Rinse mouth thoroughly after use 1 Inhaler 12  . folic acid (FOLVITE) 1 MG tablet Take 1 tablet (1 mg total) by mouth daily. 30 tablet 2  . HYDROcodone-acetaminophen (NORCO/VICODIN) 5-325 MG tablet Take 1 tablet by mouth every 8 (eight) hours as needed for severe pain. Must last 30 days 90 tablet 0  . levocetirizine (XYZAL) 5 MG tablet Take 0.5 tablets (2.5 mg total) by mouth every evening. (Patient taking differently: Take 5 mg by mouth at bedtime as needed for allergies.) 45 tablet 3  . levothyroxine (SYNTHROID) 175 MCG tablet Take 1 tablet (175 mcg total) by mouth daily before breakfast. 90 tablet 1  . losartan-hydrochlorothiazide (HYZAAR) 50-12.5 MG tablet Take 1 tablet by mouth daily.    . metFORMIN (GLUCOPHAGE) 500 MG tablet Take 1 tablet (500 mg total) by mouth daily with breakfast. With food 90 tablet 3  . metoprolol succinate (TOPROL-XL) 50 MG 24 hr tablet TAKE 1 TABLET BY MOUTH DAILY (Patient taking differently: Take 50 mg by mouth daily.) 90 tablet 1  . Misc. Devices (TUB TRANSFER BOARD) MISC 1 Device by Does not apply route as needed. 1 each 0  . montelukast  (SINGULAIR) 10 MG tablet Take 1 tablet (10 mg total) by mouth at bedtime. 90 tablet 1  . Multiple Vitamin (MULTIVITAMIN WITH MINERALS) TABS tablet Take 1 tablet by mouth daily.    . ondansetron (ZOFRAN) 4 MG tablet Take 1 tablet (4 mg total) by mouth every 8 (eight) hours as needed for nausea or vomiting. 90 tablet 1  . oxybutynin (DITROPAN-XL) 10 MG 24 hr tablet Take 10 mg by mouth at bedtime.    . pantoprazole (PROTONIX) 40 MG tablet Take 40 mg by mouth daily.     . pregabalin (LYRICA) 50 MG capsule Take 1 capsule (50 mg total) by mouth 3 (three) times daily. 90 capsule 2  . rosuvastatin (CRESTOR) 20 MG tablet Take 1 tablet (20 mg total) by mouth daily. 30 tablet 5  . sitaGLIPtin (JANUVIA) 25 MG tablet Take 1 tablet (25 mg total) by mouth daily. 90 tablet 3  . sucralfate (CARAFATE) 1 GM/10ML suspension Take 1 g by mouth 3 (three) times daily.     Marland Kitchen thiamine 100 MG tablet Take 0.5 tablets (50 mg total) by mouth 2 (two) times daily with a meal. 60 tablet 2  . TOVIAZ 8 MG TB24 tablet TAKE ONE TABLET BY MOUTH EVERY DAY (Patient taking differently: No sig reported) 90 tablet 3  .  trospium (SANCTURA) 20 MG tablet Take 20 mg by mouth 2 (two) times daily.    . vitamin B-12 1000 MCG tablet Take 1 tablet (1,000 mcg total) by mouth daily. 30 tablet 2  . HYDROcodone-acetaminophen (NORCO/VICODIN) 5-325 MG tablet Take 1 tablet by mouth every 8 (eight) hours as needed for severe pain. Must last 30 days 90 tablet 0  . [START ON 04/19/2020] HYDROcodone-acetaminophen (NORCO/VICODIN) 5-325 MG tablet Take 1 tablet by mouth every 8 (eight) hours as needed for severe pain. Must last 30 days 90 tablet 0     Discharge Medications: Please see discharge summary for a list of discharge medications.  Relevant Imaging Results:  Relevant Lab Results:   Additional Information    Cyara Devoto, [place],

## 2020-04-07 NOTE — Consult Note (Signed)
[place] Nurse Consult Note: Reason for Consult: Left foot, second toe ulceration, also healing full thickness wound. See photos added to EMR today by Dr. Roosevelt Locks. Wound type: neuropathic, no evidence of infection Pressure Injury POA: N/A Measurement:To be obtained by Bedside RN today prior to application of bacitracin ointment and entered on to Nursing Flow Sheet Wound SLH:TDSK, dry Drainage (amount, consistency, odor) none Periwound:intact Dressing procedure/placement/frequency: Dr. Roosevelt Locks indicates that he will refer to outpatient podiatry upon discharge. Topical therapy is sufficient for in house care and wounds are healing and not infected. There is no exudate, no erythema, warmth or induration.  [place] nursing team will not follow, but will remain available to this patient, the nursing and medical teams.  Please re-consult if needed. Thanks, Maudie Flakes, MSN, RN, [place], Arther Abbott  Pager# [phone]

## 2020-04-08 ENCOUNTER — Inpatient Hospital Stay: Payer: Medicare HMO

## 2020-04-08 ENCOUNTER — Telehealth: Payer: Self-pay | Admitting: Internal Medicine

## 2020-04-08 ENCOUNTER — Other Ambulatory Visit: Payer: Self-pay

## 2020-04-08 DIAGNOSIS — E1142 Type 2 diabetes mellitus with diabetic polyneuropathy: Secondary | ICD-10-CM | POA: Diagnosis not present

## 2020-04-08 DIAGNOSIS — L97422 Non-pressure chronic ulcer of left heel and midfoot with fat layer exposed: Secondary | ICD-10-CM

## 2020-04-08 DIAGNOSIS — I6381 Other cerebral infarction due to occlusion or stenosis of small artery: Secondary | ICD-10-CM

## 2020-04-08 DIAGNOSIS — E785 Hyperlipidemia, unspecified: Secondary | ICD-10-CM | POA: Diagnosis not present

## 2020-04-08 DIAGNOSIS — I6523 Occlusion and stenosis of bilateral carotid arteries: Secondary | ICD-10-CM | POA: Diagnosis not present

## 2020-04-08 DIAGNOSIS — E11621 Type 2 diabetes mellitus with foot ulcer: Secondary | ICD-10-CM

## 2020-04-08 DIAGNOSIS — I651 Occlusion and stenosis of basilar artery: Secondary | ICD-10-CM | POA: Diagnosis not present

## 2020-04-08 DIAGNOSIS — I10 Essential (primary) hypertension: Secondary | ICD-10-CM | POA: Diagnosis not present

## 2020-04-08 DIAGNOSIS — I6603 Occlusion and stenosis of bilateral middle cerebral arteries: Secondary | ICD-10-CM | POA: Diagnosis not present

## 2020-04-08 DIAGNOSIS — Z8673 Personal history of transient ischemic attack (TIA), and cerebral infarction without residual deficits: Secondary | ICD-10-CM | POA: Diagnosis not present

## 2020-04-08 DIAGNOSIS — I480 Paroxysmal atrial fibrillation: Secondary | ICD-10-CM | POA: Diagnosis not present

## 2020-04-08 DIAGNOSIS — I6521 Occlusion and stenosis of right carotid artery: Secondary | ICD-10-CM | POA: Diagnosis not present

## 2020-04-08 DIAGNOSIS — I672 Cerebral atherosclerosis: Secondary | ICD-10-CM | POA: Diagnosis not present

## 2020-04-08 DIAGNOSIS — I472 Ventricular tachycardia: Secondary | ICD-10-CM | POA: Diagnosis not present

## 2020-04-08 LAB — PROTEIN ELECTROPHORESIS, SERUM
A/G Ratio: 1 (ref 0.7–1.7)
Albumin ELP: 3.2 g/dL (ref 2.9–4.4)
Alpha-1-Globulin: 0.3 g/dL (ref 0.0–0.4)
Alpha-2-Globulin: 1.2 g/dL — ABNORMAL HIGH (ref 0.4–1.0)
Beta Globulin: 0.9 g/dL (ref 0.7–1.3)
Gamma Globulin: 0.8 g/dL (ref 0.4–1.8)
Globulin, Total: 3.3 g/dL (ref 2.2–3.9)
Total Protein ELP: 6.5 g/dL (ref 6.0–8.5)

## 2020-04-08 LAB — SEDIMENTATION RATE: Sed Rate: 60 mm/hr — ABNORMAL HIGH (ref 0–30)

## 2020-04-08 LAB — C-REACTIVE PROTEIN: CRP: 3.1 mg/dL — ABNORMAL HIGH (ref <1.0)

## 2020-04-08 MED ORDER — SUCRALFATE 1 GM/10ML PO SUSP
1.0000 g | Freq: Three times a day (TID) | ORAL | Status: DC
  Administered 2020-04-08 – 2020-04-15 (×27): 1 g via ORAL
  Filled 2020-04-08 (×36): qty 10

## 2020-04-08 NOTE — Progress Notes (Signed)
Physical Therapy Treatment Patient Details Name: [name] MRN: [id number] DOB: 1950/06/02 Today's Date: 04/08/2020    History of Present Illness Pt is a 70 y/o F admitted on 04/06/20 after presenting to ED for further evaluation of a new stroke by guidance of her PCP. MRI revealed recurrent stroke/acute punctate focus of restricted diffusion in the right external capsule region. PMH: CVA (October & November 2021), HTN, DM2, morbid obesity, chronic pain, paroxysmal a-fib, DVT, OSA on CPAP, raynaud's phenomenon, anxiety, depression, asthma, cystocele, dyspnea, GERD, heart murmur, IBS, HLD, BLE neuropathy, obesity, PVD, urinary incontinence    PT Comments    Pt seen for PT treatment with co-tx with OT to maximize pt & therapist's safety & progress mobility. Pt continues to be limited by pain in BLE with PT/OT providing education/distraction throughout session. Pt continues to require max assist +2 for bed mobility, then is able to complete multiple sit<>stands from EOB with total assist +2. Pt is successful in achieving full upright posture, even standing for a max of 10 seconds on last trial! Anticipate pt could progress with mobility once BLE pain becomes more managed. Will continue to follow pt acutely to progress transfers & independence with bed mobility.     Follow Up Recommendations  SNF;Supervision/Assistance - 24 hour     Equipment Recommendations  None recommended by PT    Recommendations for Other Services       Precautions / Restrictions Precautions Precautions: Fall Precaution Comments: per last admission, recommendation to minimize weight bearing through B heels (pt used personal wedge shoes from home) Restrictions Weight Bearing Restrictions: No    Mobility  Bed Mobility Overal bed mobility: Needs Assistance Bed Mobility: Supine to Sit;Sit to Supine     Supine to sit: Max assist;+2 for physical assistance;HOB elevated Sit to supine: Max assist;+2 for physical  assistance;HOB elevated   General bed mobility comments: Pt is able to move BLE to EOB and assist with elevating them during supine<>sit but requires MAX assist for trunk management & to achieve midline sitting balance.  Transfers Overall transfer level: Needs assistance Equipment used: Rolling walker (2 wheeled) (sheet underneath bottom, 3" step (BLE too short to touch floor from elevated ED bed)) Transfers: Sit to/from Stand Sit to Stand: Total assist;+2 physical assistance;From elevated surface         General transfer comment: PT/OT blocking BLE knees, using sheet under buttocks, RW for pt's BUE support, rocking 3 times to increase ease of transfer. Pt completes ~4 repetitions of sit<>stand with education/demo & max multimodal cuing for increased anterior weight shift & pushing through BLE as much as pt can tolerate to increase ease of transfer. Pt fearful during first attempt & doens't clear buttocks from bed, then is able to achieve full upright standing, and on last attempt stands for 10 seconds before returning to sit. Pt requires assistance for anterior pelvic shift in standing to achieve upright posture but still demonstrates downward vs forward gaze. Pt with stress incontinence of bladder during sit>stand.  Ambulation/Gait                 Stairs             Wheelchair Mobility    Modified Rankin (Stroke Patients Only)       Balance Overall balance assessment: Needs assistance Sitting-balance support: Bilateral upper extremity supported;Feet supported Sitting balance-Leahy Scale: Fair Sitting balance - Comments: close supervision static sitting once pt achieves midline   Standing balance support: Bilateral upper extremity supported;During  functional activity Standing balance-Leahy Scale: Zero Standing balance comment: total assist +2 with BUE support on RW                            Cognition Arousal/Alertness: Awake/alert Behavior During  Therapy: Anxious Overall Cognitive Status: Within Functional Limits for tasks assessed                                 General Comments: Anxious re: mobility & pain      Exercises      General Comments General comments (skin integrity, edema, etc.): Anxious & tearful at beginning of session with PT/OT providing encouragement. Fatigued at end of session.      Pertinent Vitals/Pain Pain Assessment: Faces Faces Pain Scale: Hurts whole lot Pain Location: B feet Pain Descriptors / Indicators: Grimacing;Discomfort Pain Intervention(s): Repositioned;Monitored during session    Home Living                      Prior Function            PT Goals (current goals can now be found in the care plan section) Acute Rehab PT Goals Patient Stated Goal: to walk again PT Goal Formulation: With patient Time For Goal Achievement: 04/21/20 Potential to Achieve Goals: Fair Progress towards PT goals: Progressing toward goals    Frequency    Min 2X/week      PT Plan Current plan remains appropriate    Co-evaluation PT/OT/SLP Co-Evaluation/Treatment: Yes Reason for Co-Treatment: For patient/therapist safety;To address functional/ADL transfers PT goals addressed during session: Mobility/safety with mobility;Balance OT goals addressed during session: ADL's and self-care;Proper use of Adaptive equipment and DME      AM-PAC PT "6 Clicks" Mobility   Outcome Measure  Help needed turning from your back to your side while in a flat bed without using bedrails?: Total Help needed moving from lying on your back to sitting on the side of a flat bed without using bedrails?: Total Help needed moving to and from a bed to a chair (including a wheelchair)?: Total Help needed standing up from a chair using your arms (e.g., wheelchair or bedside chair)?: Total Help needed to walk in hospital room?: Total Help needed climbing 3-5 steps with a railing? : Total 6 Click Score:  6    End of Session   Activity Tolerance: Patient limited by fatigue Patient left: in bed;with call bell/phone within reach (BLE elevated with floating heels)   PT Visit Diagnosis: Difficulty in walking, not elsewhere classified (R26.2);Muscle weakness (generalized) (M62.81);Pain Pain - Right/Left:  (BLE) Pain - part of body: Ankle and joints of foot     Time: 6269-4854 PT Time Calculation (min) (ACUTE ONLY): 25 min  Charges:  $Therapeutic Activity: 8-22 mins                     Lavone Nian, PT, DPT 04/08/20, 2:09 PM    Waunita Schooner 04/08/2020, 2:07 PM

## 2020-04-08 NOTE — Progress Notes (Signed)
Central tele called to report patient had an 8 beat run of [place]. Dr. Roosevelt Locks notified. Patient reports no new complaints, sitting up in bed eating dinner.

## 2020-04-08 NOTE — TOC Progression Note (Signed)
Transition of Care [facility]) - Progression Note    Patient Details  Name: [name] MRN: [id number] Date of Birth: [birth year]  Transition of Care [facility]) CM/SW [place], [place] Phone Number: [phone] 04/08/2020, 3:31 PM  Clinical Narrative:     CSW spoke with patient about SNF placement and let her know that I had not received bed offer from a SNF yet but that I would keep her updated with any offers.  Patient verbalized understanding.  CSW increased the search to SNFs in [place].  Patient's preferred is [facility] but patient understands it is not a guaranteed placement and will go to any SNF that is available when she is ready to d/c.  Expected Discharge Plan: Skilled Nursing Facility Barriers to Discharge: Continued Medical Work up,SNF Pending bed offer  Expected Discharge Plan and Services Expected Discharge Plan: [place] In-house Referral: Clinical Social Work   Post Acute Care Choice: [place] Living arrangements for the past 2 months: [place] Determinants of Health (SDOH) Interventions    Readmission Risk Interventions Readmission Risk Prevention Plan 07/30/2018  Transportation Screening Complete  PCP or Specialist Appt within 3-5 Days Complete  HRI or Home Care Consult Complete  Social Work Consult for [place] Planning/Counseling Patient refused  Palliative Care Screening Patient Refused  Medication Review Press photographer) Complete  Some recent data might be hidden

## 2020-04-08 NOTE — Telephone Encounter (Signed)
Patient has b/l leg weakness and left upper arm weakness (likely multifactorial due to physical deconditioning and left hemiparesis 2/2 prior 2 strokes now has a 3rd)   She needs PT/OT to comment SNF while in hospital x 3 days in order to get PT/OT at [facility] and will have to drop Palliative care as she is motivated to try to rehab to get well   Currently husband unable to help mobilize patient at home for ADLS and IADLS due to his chronic back pain and pt unable to do this either had to take EMS x 3 for transportation to my office 04/04/20, MRI appt and back to the ED  If she does not have a manual wheelchair at home this is needed and consideration of electric wheelchair   Multiple specialists appts missed due to inability to mobilize or transport still needs to see specialists outpatient (see my PCP note 04/04/20) and hopefully if she gets to [facility] they can assist in transportation needs   Dr. Olivia Mackie  [name]

## 2020-04-08 NOTE — Telephone Encounter (Signed)
Dr Olivia Mackie [name] called and spoke with [name] and her husband Friday afternoon. She informed the [name] to go to the ED to be admitted. Our office is not allowed to coordinate care to a rehab facility. Per [name]'s insurance she would have to have a 3 day hospital stay and then be transferred to the facility. Also our office is no longer able to do a direct admit.   [name] qualifies to be admitted due to new stroke seen on recent mri.   [name]'s husband states that he was informed by the social worker that COVID is really bad in the hospital and that if the [name] were to come there she could catch Covid and die.  [name]'s husband stated that he will not allow the [name] to go there. Stats that he would rather she die of natural causes than COVID.

## 2020-04-08 NOTE — ED Notes (Signed)
US at bedside

## 2020-04-08 NOTE — ED Notes (Signed)
Pt screened by MRI at this time

## 2020-04-08 NOTE — ED Notes (Signed)
Patient back from MRI.

## 2020-04-08 NOTE — Progress Notes (Signed)
PROGRESS NOTE    [name]  DEY:[id number] DOB: 11-25-1950 DOA: 04/06/2020 PCP: [name], [name], MD   Leg weakness. Brief Narrative:  Dawn Sheller Robertsis a 70 y.o.adultwith medical history significant forCVA, hypertension, diabetes mellitus type 2, morbid obesity, chronic pain, paroxysmal atrial fibrillation, previous DVT, OSA on CPAP, presented to the emergency department for chief concerns of primary care provider telling her to come to the emergency department for further evaluation due to new stroke. Patient states that the she has a left arm and left leg weakness for the last 2 or 3 months.  But she is very vague about her symptoms, she states that she might of had worsening weakness 5 days ago. MRI of the brain showed several small infarcts in the right side of brain.    Assessment & Plan:   Active Problems:   AF (paroxysmal atrial fibrillation) (HCC)   Diabetic peripheral neuropathy associated with type 2 diabetes mellitus ([place])   Obstructive sleep apnea of adult   Diabetic ulcer of left foot ([place])   Acute embolic stroke ([place])   Stroke ([place])  #1.  White matter changes. Paroxysmal atrial fibrillation.  Discussed with neurology, patient MRI basically showed small vessel disease instead of stroke.  As a result, there will be no change inpatient treatment. I will continue Eliquis for anticoagulation. Patient still has significant weakness, continue physical therapy and Occupational Therapy.  #2.  Essential hypertension. Continue home medicines per  3.  Left lower extremity second toe lesion. Foot pain. Further inspection does not seem to be an ulceration.  Will obtain consult from podiatry. Foot pain could be secondary to the diabetes neuropathy versus plantar fasciitis.  4.  Type 2 diabetes. Continue current regimen.  #5 morbid obesity.    DVT prophylaxis: Eliquis Code Status: Full Family Communication: Husband updated Disposition Plan:   .   Status is: Inpatient  Remains inpatient appropriate because:Unsafe d/c plan   Dispo: The patient is from: Home              Anticipated d/c is to: SNF              Anticipated d/c date is: Uncertain              Patient currently is medically stable to d/c.        No intake/output data recorded. No intake/output data recorded.     Consultants:   Neurology and podiatry  Procedures: None  Antimicrobials: None  Subjective: Patient complaining of bilateral foot pain and leg weakness. She denies any short of breath or cough. She has no abdominal pain or nausea vomiting.  No fever or chills..  Objective: Vitals:   04/08/20 1300 04/08/20 1330 04/08/20 1400 04/08/20 1445  BP: 101/77 114/63 113/80   Pulse: 91 63 64 62  Resp: (!) 21 19 20 20   Temp:      TempSrc:      SpO2: 98% 100% 100% 96%   No intake or output data in the 24 hours ending 04/08/20 1500 There were no vitals filed for this visit.  Examination:  General exam: Appears calm and comfortable, morbidly obese Respiratory system: Clear to auscultation. Respiratory effort normal. Cardiovascular system: S1 & S2 heard, RRR. No JVD, murmurs, rubs, gallops or clicks. No pedal edema. Gastrointestinal system: Abdomen is nondistended, soft and nontender. No organomegaly or masses felt. Normal bowel sounds heard. Central nervous system: Alert and oriented. No focal neurological deficits. Extremities: Symmetric  Skin: No rashes, lesions or  ulcers Psychiatry:  Mood & affect appropriate.     Data Reviewed: I have personally reviewed following labs and imaging studies  CBC: Recent Labs  Lab 04/05/20 1215  WBC 7.1  NEUTROABS 5.4  HGB 10.0*  HCT 32.9*  MCV 86.8  PLT [id number]   Basic Metabolic Panel: Recent Labs  Lab 04/05/20 1215 04/06/20 1627  NA 142 140  K 4.7 4.8  CL 104 102  CO2 26 27  GLUCOSE 102* 107*  BUN 25* 25*  CREATININE 1.13* 1.11*  CALCIUM 9.1 9.1   GFR: Estimated Creatinine  Clearance (by C-G formula based on SCr of 1.11 mg/dL (H)) Female: 55.2 mL/min (A) Female: 67.3 mL/min (A) Liver Function Tests: Recent Labs  Lab 04/05/20 1215  AST 18  ALT 14  ALKPHOS 88  BILITOT 0.5  PROT 7.4  ALBUMIN 3.3*   No results for input(s): LIPASE, AMYLASE in the last 168 hours. No results for input(s): AMMONIA in the last 168 hours. Coagulation Profile: Recent Labs  Lab 04/06/20 1639  INR 1.6*   Cardiac Enzymes: No results for input(s): CKTOTAL, CKMB, CKMBINDEX, TROPONINI in the last 168 hours. BNP (last 3 results) No results for input(s): PROBNP in the last 8760 hours. HbA1C: Recent Labs    04/07/20 0500  HGBA1C 6.7*   CBG: Recent Labs  Lab 04/07/20 0751  GLUCAP 102*   Lipid Profile: Recent Labs    04/07/20 0500  CHOL 65  HDL 46  LDLCALC 9  TRIG 51  CHOLHDL 1.4   Thyroid Function Tests: No results for input(s): TSH, T4TOTAL, FREET4, T3FREE, THYROIDAB in the last 72 hours. Anemia Panel: No results for input(s): VITAMINB12, FOLATE, FERRITIN, TIBC, IRON, RETICCTPCT in the last 72 hours. Sepsis Labs: No results for input(s): PROCALCITON, LATICACIDVEN in the last 168 hours.  Recent Results (from the past 240 hour(s))  Resp Panel by RT-PCR (Flu A&B, Covid) Nasopharyngeal Swab     Status: None   Collection Time: 04/06/20  4:27 PM   Specimen: Nasopharyngeal Swab; Nasopharyngeal(NP) swabs in vial transport medium  Result Value Ref Range Status   SARS Coronavirus 2 by RT PCR NEGATIVE NEGATIVE Final    Comment: (NOTE) SARS-CoV-2 target nucleic acids are NOT DETECTED.  The SARS-CoV-2 RNA is generally detectable in upper respiratory specimens during the acute phase of infection. The lowest concentration of SARS-CoV-2 viral copies this assay can detect is 138 copies/mL. A negative result does not preclude SARS-Cov-2 infection and should not be used as the sole basis for treatment or other patient management decisions. A negative result may occur with   improper specimen collection/handling, submission of specimen other than nasopharyngeal swab, presence of viral mutation(s) within the areas targeted by this assay, and inadequate number of viral copies(<138 copies/mL). A negative result must be combined with clinical observations, patient history, and epidemiological information. The expected result is Negative.  Fact Sheet for Patients:  EntrepreneurPulse.com.au  Fact Sheet for Healthcare Providers:  IncredibleEmployment.be  This test is no t yet approved or cleared by the [place] FDA and  has been authorized for detection and/or diagnosis of SARS-CoV-2 by FDA under an Emergency Use Authorization (EUA). This EUA will remain  in effect (meaning this test can be used) for the duration of the COVID-19 declaration under Section 564(b)(1) of the Act, 21 U.S.C.section 360bbb-3(b)(1), unless the authorization is terminated  or revoked sooner.       Influenza A by PCR NEGATIVE NEGATIVE Final   Influenza B by PCR NEGATIVE NEGATIVE Final  Comment: (NOTE) The Xpert Xpress SARS-CoV-2/FLU/RSV plus assay is intended as an aid in the diagnosis of influenza from Nasopharyngeal swab specimens and should not be used as a sole basis for treatment. Nasal washings and aspirates are unacceptable for Xpert Xpress SARS-CoV-2/FLU/RSV testing.  Fact Sheet for Patients: EntrepreneurPulse.com.au  Fact Sheet for Healthcare Providers: IncredibleEmployment.be  This test is not yet approved or cleared by the [place] FDA and has been authorized for detection and/or diagnosis of SARS-CoV-2 by FDA under an Emergency Use Authorization (EUA). This EUA will remain in effect (meaning this test can be used) for the duration of the COVID-19 declaration under Section 564(b)(1) of the Act, 21 U.S.C. section 360bbb-3(b)(1), unless the authorization is terminated  or revoked.  Performed at [facility], [address] [place], [place] [address]          Radiology Studies: MR ANGIO HEAD WO CONTRAST  Result Date: 04/08/2020 CLINICAL DATA:  70 year old female with lower extremity weakness, right basal ganglia lacunar infarct and posterior corpus callosum, periatrial infarcts on MRI 04/05/2020. EXAM: MRA HEAD WITHOUT CONTRAST TECHNIQUE: Angiographic images of the Circle of Willis were obtained using MRA technique without intravenous contrast. COMPARISON:  Head MRA 12/22/2019. FINDINGS: No intracranial mass effect or ventriculomegaly. Less motion artifact today compared to the October MRA. Stable antegrade flow in the posterior circulation with dominant appearing left vertebral artery. No distal vertebral stenosis. Patent vertebrobasilar junction. Patent basilar artery with mild to moderate irregularity but only mild stenosis which appears stable (series 1034, image 11). Patent SCA and PCA origins. Fetal type left PCA again noted. Right posterior communicating artery remains patent. Bilateral PCA branches are within normal limits. Stable antegrade flow in both ICA siphons. Probable artifact today at the right anterior genu, which was only mildly irregular in October, and supraclinoid flow signal appears stable. Left ICA siphon irregularity without stenosis. Carotid termini, MCA and ACA origins remain patent. Dominant left ACA as before. Anterior communicating artery and bilateral ACA branches are stable and within normal limits. MCA M1 segments and MCA bifurcations remain patent. Mild irregularity and stenosis of left MCA M2 branches is more apparent today but stable, including the posterior left M2 seen on series 1024, image 5. IMPRESSION: 1. Intracranial MRA is negative for large vessel occlusion and appears stable since October. 2. Suspect artifact at the right anterior genu today. There does appear to be mild atherosclerotic stenosis of the: - mid  Basilar Artery. - bilateral MCA M2 branches. Electronically Signed   By: Genevie Ann M.D.   On: 04/08/2020 10:09   US Carotid Bilateral  Result Date: 04/08/2020 CLINICAL DATA:  Cerebral infarction, hypertension, hyperlipidemia and diabetes. EXAM: BILATERAL CAROTID DUPLEX ULTRASOUND TECHNIQUE: Pearline Cables scale imaging, color Doppler and duplex ultrasound were performed of bilateral carotid and vertebral arteries in the neck. COMPARISON:  None. FINDINGS: Criteria: Quantification of carotid stenosis is based on velocity parameters that correlate the residual internal carotid diameter with NASCET-based stenosis levels, using the diameter of the distal internal carotid lumen as the denominator for stenosis measurement. The following velocity measurements were obtained: RIGHT ICA:  58/16 cm/sec CCA:  [id number] cm/sec SYSTOLIC ICA/CCA RATIO:  0.9 ECA:  70 cm/sec LEFT ICA:  64/22 cm/sec CCA:  [id number] cm/sec SYSTOLIC ICA/CCA RATIO:  1.0 ECA:  53 cm/sec RIGHT CAROTID ARTERY: There is a mild amount of partially calcified plaque at the level of the distal bulb and proximal right ICA. Estimated right ICA stenosis is less than 50%. RIGHT VERTEBRAL ARTERY: Antegrade flow  with normal waveform and velocity. LEFT CAROTID ARTERY: No focal plaque identified. No evidence of left carotid stenosis. LEFT VERTEBRAL ARTERY: Antegrade flow with normal waveform and velocity. IMPRESSION: 1. Mild plaque at the level of the right carotid bifurcation with estimated proximal right ICA stenosis of less than 50%. 2. No evidence of left-sided carotid plaque or stenosis in the neck. Electronically Signed   By: Aletta Edouard M.D.   On: 04/08/2020 11:02   ECHOCARDIOGRAM COMPLETE BUBBLE STUDY  Result Date: 04/07/2020    ECHOCARDIOGRAM REPORT   Patient Name:   DALE HOCHSTETLER Date of Exam: 04/07/2020 Medical Rec #:  [id number]       Height:       65.0 in Accession #:    [id number]      Weight:       220.0 lb Date of Birth:  May 26, 1950       BSA:          2.060 m  Patient Age:    49 years        BP:           121/66 mmHg Patient Gender: F               HR:           59 bpm. Exam Location:  [facility] Procedure: 2D Echo, Saline Contrast Bubble Study, Limited Color Doppler and            Cardiac Doppler Indications:     I63.9 Stroke  History:         Patient has prior history of Echocardiogram examinations, most                  recent 09/03/2019. PVD, Arrythmias:P afib; Risk Factors:Sleep                  Apnea, Dyslipidemia and Hypertension.  Sonographer:     Charmayne Sheer RDCS (AE) Referring Phys:  [id number] AMY N COX Diagnosing Phys: Kate Sable MD  Sonographer Comments: No apical window and no subcostal window. Image acquisition challenging due to patient body habitus. IMPRESSIONS  1. Left ventricular ejection fraction, by estimation, is 60 to 65%. The left ventricle has normal function. The left ventricle has no regional wall motion abnormalities. Left ventricular diastolic function could not be evaluated.  2. Right ventricular systolic function is normal. The right ventricular size is not well visualized.  3. The mitral valve is degenerative. Mild mitral valve regurgitation.  4. Tricuspid valve regurgitation is mild to moderate.  5. The aortic valve is tricuspid. Aortic valve regurgitation is not visualized.  6. Agitated saline contrast bubble study was negative, with no evidence of any interatrial shunt. FINDINGS  Left Ventricle: Left ventricular ejection fraction, by estimation, is 60 to 65%. The left ventricle has normal function. The left ventricle has no regional wall motion abnormalities. The left ventricular internal cavity size was normal in size. There is  no left ventricular hypertrophy. Left ventricular diastolic function could not be evaluated. Right Ventricle: The right ventricular size is not well visualized. No increase in right ventricular wall thickness. Right ventricular systolic function is normal. Left Atrium: Left atrial size was normal in size. Right  Atrium: Right atrial size was not well visualized. Pericardium: There is no evidence of pericardial effusion. Mitral Valve: The mitral valve is degenerative in appearance. Mild to moderate mitral annular calcification. Mild mitral valve regurgitation. Tricuspid Valve: The tricuspid valve is normal in structure. Tricuspid valve regurgitation is  mild to moderate. Aortic Valve: The aortic valve is tricuspid. Aortic valve regurgitation is not visualized. Pulmonic Valve: The pulmonic valve was normal in structure. Pulmonic valve regurgitation is not visualized. Aorta: The aortic root and ascending aorta are structurally normal, with no evidence of dilitation. Venous: The inferior vena cava was not well visualized. IAS/Shunts: No atrial level shunt detected by color flow Doppler. Agitated saline contrast was given intravenously to evaluate for intracardiac shunting. Agitated saline contrast bubble study was negative, with no evidence of any interatrial shunt.  LEFT VENTRICLE PLAX 2D LVIDd:         4.40 cm LVIDs:         2.60 cm LV PW:         1.20 cm LV IVS:        0.90 cm LVOT diam:     1.80 cm LVOT Area:     2.54 cm  LEFT ATRIUM         Index LA diam:    4.70 cm 2.28 cm/m                        PULMONIC VALVE AORTA                 PV Vmax:       1.21 m/s Ao Root diam: 3.00 cm PV Vmean:      84.100 cm/s                       PV VTI:        0.293 m                       PV Peak grad:  5.9 mmHg                       PV Mean grad:  3.0 mmHg  TRICUSPID VALVE TR Peak grad:   37.2 mmHg TR Vmax:        305.00 cm/s  SHUNTS Systemic Diam: 1.80 cm Kate Sable MD Electronically signed by Kate Sable MD Signature Date/Time: 04/07/2020/9:26:31 AM    Final         Scheduled Meds: .  stroke: mapping our early stages of recovery book   Does not apply Once  . amLODipine  5 mg Oral Daily  . apixaban  5 mg Oral BID  . bacitracin   Topical BID  . buPROPion  150 mg Oral Daily  . cholecalciferol  1,000 Units Oral BID  WC  . DULoxetine  60 mg Oral Daily  . fesoterodine  8 mg Oral Daily  . folic acid  1 mg Oral Daily  . losartan  50 mg Oral Daily   And  . hydrochlorothiazide  12.5 mg Oral Daily  . ipratropium-albuterol  3 mL Nebulization Q6H  . ipratropium-albuterol  3 mL Nebulization Q6H  . levothyroxine  175 mcg Oral Q0600  . linagliptin  5 mg Oral Daily  . metFORMIN  500 mg Oral Q breakfast  . metoprolol succinate  50 mg Oral Daily  . mometasone-formoterol  2 puff Inhalation BID  . multivitamin with minerals  1 tablet Oral Daily  . oxybutynin  10 mg Oral QHS  . rosuvastatin  20 mg Oral Daily  . sucralfate  1 g Oral TID WC & HS  . thiamine  50 mg Oral BID WC  . cyanocobalamin  1,000 mcg Oral Daily  . [  START ON 04/12/2020] Vitamin D (Ergocalciferol)  50,000 Units Oral Weekly   Continuous Infusions:   LOS: 1 day    Time spent: 27 minutes    Sharen Hones, MD [facility]   To contact the attending provider between 7A-7P or the covering provider during after hours 7P-7A, please log into the web site [email or web address] and access using universal [place] password for that web site. If you do not have the password, please call the hospital operator.  04/08/2020, 3:00 PM

## 2020-04-08 NOTE — Progress Notes (Signed)
Occupational Therapy Treatment Patient Details Name: [name] MRN: [id number] DOB: 10/15/1950 Today's Date: 04/08/2020    History of present illness Pt is a 70 y/o F admitted on 04/06/20 after presenting to ED for further evaluation of a new stroke by guidance of her PCP. MRI revealed recurrent stroke/acute punctate focus of restricted diffusion in the right external capsule region. PMH: CVA (October & November 2021), HTN, DM2, morbid obesity, chronic pain, paroxysmal a-fib, DVT, OSA on CPAP, raynaud's phenomenon, anxiety, depression, asthma, cystocele, dyspnea, GERD, heart murmur, IBS, HLD, BLE neuropathy, obesity, PVD, urinary incontinence   OT comments  [name] seen for OT/PT co-treatment on this date. Upon arrival to room pt reclined in bed reporting little sleep but agreeable to tx. Pt requires MAX A x2 perihygiene at bed level. MAX A x2 sup<>sit and CGA self-drinking seated EOB. Pt tolerated x3 standing attempts, achieves fully upright in last 2 trials with MAX encouragement and sequencing. Pt requires sheet under rear to achieve sit<>Stand c TOTAL A x2. Pt making progress toward goals. Pt continues to benefit from skilled OT services to maximize return to PLOF and minimize risk of future falls, injury, caregiver burden, and readmission. Will continue to follow POC. Discharge recommendation remains appropriate.    Follow Up Recommendations  SNF    Equipment Recommendations  None recommended by OT    Recommendations for Other Services      Precautions / Restrictions Precautions Precautions: Fall Precaution Comments: per last admission, recommendation to minimize weight bearing through B heels (pt used personal wedge shoes from home) Restrictions Weight Bearing Restrictions: No       Mobility Bed Mobility Overal bed mobility: Needs Assistance Bed Mobility: Supine to Sit;Sit to Supine     Supine to sit: Max assist;+2 for physical assistance;HOB elevated Sit to supine:  Max assist;+2 for physical assistance;HOB elevated   General bed mobility comments: Pt is able to move BLE to EOB and assist with elevating them during supine<>sit but requires MAX assist for trunk management & to achieve midline sitting balance.  Transfers Overall transfer level: Needs assistance Equipment used: Rolling walker (2 wheeled) (sheet underneath bottom, 3" step (BLE too short to touch floor from elevated ED bed)) Transfers: Sit to/from Stand Sit to Stand: Total assist;+2 physical assistance;From elevated surface         General transfer comment: PT/OT blocking BLE knees, using sheet under buttocks, RW for pt's BUE support, rocking 3 times to increase ease of transfer. Pt completes ~4 repetitions of sit<>stand with education/demo & max multimodal cuing for increased anterior weight shift & pushing through BLE as much as pt can tolerate to increase ease of transfer. Pt fearful during first attempt & doens't clear buttocks from bed, then is able to achieve full upright standing, and on last attempt stands for 10 seconds before returning to sit. Pt requires assistance for anterior pelvic shift in standing to achieve upright posture but still demonstrates downward vs forward gaze. Pt with stress incontinence of bladder during sit>stand.    Balance Overall balance assessment: Needs assistance Sitting-balance support: Bilateral upper extremity supported;Feet supported Sitting balance-Leahy Scale: Fair Sitting balance - Comments: close supervision static sitting once pt achieves midline   Standing balance support: Bilateral upper extremity supported;During functional activity Standing balance-Leahy Scale: Zero Standing balance comment: total assist +2 with BUE support on RW  ADL either performed or assessed with clinical judgement   ADL Overall ADL's : Needs assistance/impaired                                       General ADL  Comments: MAX A x2 perihygiene at bed level. CGA self-drinking seated EOB               Cognition Arousal/Alertness: Awake/alert Behavior During Therapy: Anxious Overall Cognitive Status: Within Functional Limits for tasks assessed                                 General Comments: Anxious re: mobility & pain        Exercises Exercises: Other exercises Other Exercises Other Exercises: Pt educated re: OT role, DME recs, d/c recs, falls prevention, pain mgmt Other Exercises: LBD, sup<>sit, sit<>stand, sitting/standing balance/tolerance, UBD   Shoulder Instructions       General Comments Anxious & tearful at beginning of session with PT/OT providing encouragement. Fatigued at end of session.    Pertinent Vitals/ Pain       Pain Assessment: Faces Faces Pain Scale: Hurts whole lot Pain Location: B feet Pain Descriptors / Indicators: Grimacing;Discomfort Pain Intervention(s): Repositioned;Monitored during session         Frequency  Min 1X/week        Progress Toward Goals  OT Goals(current goals can now be found in the care plan section)  Progress towards OT goals: Progressing toward goals  Acute Rehab OT Goals Patient Stated Goal: to walk again OT Goal Formulation: With patient Time For Goal Achievement: 04/21/20 Potential to Achieve Goals: Fair ADL Goals Pt Will Perform Grooming: sitting;with supervision;with set-up Pt Will Perform Lower Body Dressing: with min assist;sitting/lateral leans Pt Will Transfer to Toilet: with max assist;squat pivot transfer;bedside commode;with +2 assist  Plan Discharge plan remains appropriate;Frequency remains appropriate    Co-evaluation    PT/OT/SLP Co-Evaluation/Treatment: Yes Reason for Co-Treatment: For patient/therapist safety;To address functional/ADL transfers PT goals addressed during session: Mobility/safety with mobility;Balance OT goals addressed during session: ADL's and self-care      AM-PAC  OT "6 Clicks" Daily Activity     Outcome Measure   Help from another person eating meals?: A Little Help from another person taking care of personal grooming?: A Little Help from another person toileting, which includes using toliet, bedpan, or urinal?: A Lot Help from another person bathing (including washing, rinsing, drying)?: A Lot Help from another person to put on and taking off regular upper body clothing?: A Little Help from another person to put on and taking off regular lower body clothing?: A Lot 6 Click Score: 15    End of Session Equipment Utilized During Treatment: Rolling walker  OT Visit Diagnosis: Other abnormalities of gait and mobility (R26.89);Muscle weakness (generalized) (M62.81)   Activity Tolerance Patient limited by pain;Patient limited by fatigue   Patient Left in bed;with call bell/phone within reach   Nurse Communication          Time: 6010-9323 OT Time Calculation (min): 25 min  Charges: OT General Charges $OT Visit: 1 Visit OT Treatments $Self Care/Home Management : 8-22 mins  Dessie Coma, M.S. OTR/L  04/08/20, 3:03 PM  ascom [phone]

## 2020-04-08 NOTE — Progress Notes (Signed)
Chart reviewed, Pt visited. Able to communicate wants and needs without difficulty. Reports she has some word finding problems during conversation but not noted today with ST. No dysphagia. Reports she had an esophageal dilatation in the past year and has no current difficulty. Requests to be woken for meals so they don't get cold or she will not eat. Encouraged her to call dining for a new tray rather than not eating if for some reason she hasn't been woken for the meal. No ST needs identified at this time. ST to screen at Rehab to see if full cognitive eval is warranted.

## 2020-04-08 NOTE — ED Notes (Signed)
Patient transported to MRI at this time. 

## 2020-04-08 NOTE — Progress Notes (Signed)
Brief Neuro Update:  Briefly, [name] is a 69 y.o. adult with a hx of pAfibb on Eliquis, morbid obesity, HTN, DM2, sleep apnea who presents with workup for MRI Brain demonstrating punctate focus of restricted diffusion in the right external capsule region consistent with acute small vessel infarction. Few smudgy areas of restricted diffusion in the right side of the splenium of the corpus callosum that could also represent small vessel infarctions. Althou she does have afibb and infarcts in the setting of Afibb can also be attributed to failure of AC or resistance to AC, it seems that the appearance of these infarcts on MRI with their distribution within the white matter is more consistent with small vessel disease rather than cardioembolic. She also has extensive white matter disease but the lesions do not appear to be consistent with a demyelinating process.  Workup so far with TTE during this admission with normal LVEF, US carotid duplex with BL carotid bifurcation plaque with < 50% ICA stenosis. MRA head with no LVO.  ESR, CRP, Lups Anticoagulant, ANA, Vasculitis panel is pending. She does have LLE toe ulceration which may confound ESR and CRP.  Overall, the etiology of her strokes is likely to be small vessel disease rather than cardioembolic. Reasonable to get vasculitis workup as ordered by Dr. Lindzen. Will need to follow up in stroke clinic.  Recs: - I have ordered US carotid duplex and MR angio head without contrast to complete stroke workup. - Follow up with stroke clinic in 2-3 months - will need to follow up on Vasculitis panel. - Follow up in sleep clinic for Sleep apnea. Likely to benefit from CPAP. - Follow up with PCP in 1-2 weeks.     [facility] Pager Number [phone]  

## 2020-04-09 DIAGNOSIS — I472 Ventricular tachycardia: Secondary | ICD-10-CM

## 2020-04-09 DIAGNOSIS — E11621 Type 2 diabetes mellitus with foot ulcer: Secondary | ICD-10-CM | POA: Diagnosis not present

## 2020-04-09 DIAGNOSIS — E1142 Type 2 diabetes mellitus with diabetic polyneuropathy: Secondary | ICD-10-CM | POA: Diagnosis not present

## 2020-04-09 DIAGNOSIS — I4729 Other ventricular tachycardia: Secondary | ICD-10-CM

## 2020-04-09 DIAGNOSIS — L97422 Non-pressure chronic ulcer of left heel and midfoot with fat layer exposed: Secondary | ICD-10-CM | POA: Diagnosis not present

## 2020-04-09 DIAGNOSIS — L97522 Non-pressure chronic ulcer of other part of left foot with fat layer exposed: Secondary | ICD-10-CM | POA: Diagnosis not present

## 2020-04-09 DIAGNOSIS — I480 Paroxysmal atrial fibrillation: Secondary | ICD-10-CM | POA: Diagnosis not present

## 2020-04-09 DIAGNOSIS — I6389 Other cerebral infarction: Secondary | ICD-10-CM

## 2020-04-09 DIAGNOSIS — L97421 Non-pressure chronic ulcer of left heel and midfoot limited to breakdown of skin: Secondary | ICD-10-CM | POA: Diagnosis not present

## 2020-04-09 DIAGNOSIS — M722 Plantar fascial fibromatosis: Secondary | ICD-10-CM | POA: Diagnosis not present

## 2020-04-09 LAB — EPSTEIN-BARR VIRUS (EBV) ANTIBODY PROFILE
EBV NA IgG: >600 U/mL — ABNORMAL HIGH (ref 0.0–17.9)
EBV VCA IgG: 508 U/mL — ABNORMAL HIGH (ref 0.0–17.9)
EBV VCA IgM: <36 U/mL (ref 0.0–35.9)

## 2020-04-09 LAB — ANA COMPREHENSIVE PANEL

## 2020-04-09 MED ORDER — IPRATROPIUM-ALBUTEROL 0.5-2.5 (3) MG/3ML IN SOLN
3.0000 mL | Freq: Two times a day (BID) | RESPIRATORY_TRACT | Status: DC
  Administered 2020-04-10 – 2020-04-14 (×9): 3 mL via RESPIRATORY_TRACT
  Filled 2020-04-09 (×10): qty 3

## 2020-04-09 MED ORDER — ENSURE ENLIVE PO LIQD
237.0000 mL | ORAL | Status: DC
  Administered 2020-04-09 – 2020-04-14 (×4): 237 mL via ORAL

## 2020-04-09 MED ORDER — ONDANSETRON HCL 4 MG PO TABS
4.0000 mg | ORAL_TABLET | Freq: Three times a day (TID) | ORAL | Status: DC | PRN
  Administered 2020-04-09 – 2020-04-10 (×2): 4 mg via ORAL
  Filled 2020-04-09 (×2): qty 1

## 2020-04-09 MED ORDER — ASCORBIC ACID 500 MG PO TABS
250.0000 mg | ORAL_TABLET | Freq: Two times a day (BID) | ORAL | Status: DC
  Administered 2020-04-09 – 2020-04-15 (×12): 250 mg via ORAL
  Filled 2020-04-09 (×11): qty 1
  Filled 2020-04-09: qty 0.5
  Filled 2020-04-09: qty 1

## 2020-04-09 NOTE — Progress Notes (Signed)
Brief Neuro Update:  Reviewed MR Angio head without contrast and carotid duplex. Both negative for a large vessel occlusion.  No further inpatient neurology workup at this time. Please have follow ups setup as mentioned in my prior note from yesterday. Neurology inpatient team will signoff. Please feel free to contact us with any questions or concerns.  [place] Pager Number [phone]

## 2020-04-09 NOTE — Progress Notes (Addendum)
Initial Nutrition Assessment  DOCUMENTATION CODES:   Morbid obesity  INTERVENTION:   Ensure Enlive po daily, each supplement provides 350 kcal and 20 grams of protein  MVI daily   Vitamin C 250mg  po BID  NUTRITION DIAGNOSIS:   Inadequate oral intake related to acute illness as evidenced by per patient/family report.  GOAL:   Patient will meet greater than or equal to 90% of their needs  MONITOR:   PO intake,Supplement acceptance,Labs,Weight trends,Skin,I & O's  REASON FOR ASSESSMENT:   Malnutrition Screening Tool    ASSESSMENT:   70 y.o. adult with medical history significant for CVA, hypertension, diabetes mellitus type 2, morbid obesity, laparoscopic sleeve gastrectomy on 11/23/2012, chronic pain, paroxysmal atrial fibrillation, previous DVT and OSA on CPAP who is admitted with new stroke.  RD working remotely.  Spoke with pt via phone. Pt reports good appetite and oral intake pta but reports that her appetite is decreased today r/t nausea. Pt reports that she ate oatmeal and yogurt for breakfast this morning. Pt is documented to be eating 100% of meals. Pt is known to nutrition department from a recent previous admit in November 2021. Pt with h/o gastric sleeve. Pt had vitamin labs checked during her last admit and was found to have numerous deficiencies. Pt reports that she has continued to take folic acid, MVI, N02 and vitamin D daily at home. Pt has been reluctant to drink supplements in the past but reports that she is willing to drink one strawberry Ensure daily in hospital; RD will order. Pt with weight gain since her last admit bu remains ~9lbs(3%) below her UBW.    Medications reviewed and include: D3, folic acid, synthroid, metformin, MVI, carafate, thiamine, B12  Labs reviewed:   NUTRITION - FOCUSED PHYSICAL EXAM: Unable to perform at this time   Diet Order:   Diet Order            Diet Heart Room service appropriate? Yes; Fluid consistency: Thin  Diet  effective now                EDUCATION NEEDS:   Education needs have been addressed  Skin:  Skin Assessment: Reviewed RN Assessment (Left foot, second toe ulceration, left posterior heel and plantar foot pain bilateral)  Last BM:  1/16  Height:   Ht Readings from Last 1 Encounters:  04/08/20 5\' 5"  (1.651 m)    Weight:   Wt Readings from Last 1 Encounters:  04/08/20 111.3 kg    Ideal Body Weight:  56.8 kg  BMI:  Body mass index is 40.83 kg/m.  Estimated Nutritional Needs:   Kcal:  2100-2400kcal/day  Protein:  110-120g/day  Fluid:  1.7L/day  Koleen Distance MS, RD, LDN Please refer to [facility] for RD and/or RD on-call/weekend/after hours pager

## 2020-04-09 NOTE — Progress Notes (Signed)
PROGRESS NOTE    [name]  ZHY:[id number] DOB: Nov 28, 1950 DOA: 04/06/2020 PCP: [name], [name], MD   Chief complaint.  Leg weakness. Brief Narrative:  Dawn Pineau Robertsis a 70 y.o.adultwith medical history significant forCVA, hypertension, diabetes mellitus type 2, morbid obesity, chronic pain, paroxysmal atrial fibrillation, previous DVT, OSA on CPAP, presented to the emergency department for chief concerns of primary care provider telling her to come to the emergency department for further evaluation due to new stroke. Patient states that the she has a left arm and left leg weakness for the last 2 or 3 months. But she is very vague about her symptoms, she states that she might of had worsening weakness 5 days ago. MRI of the brain showed several small infarctsin the right side of brain.   Assessment & Plan:   Active Problems:   AF (paroxysmal atrial fibrillation) (HCC)   Diabetic peripheral neuropathy associated with type 2 diabetes mellitus ([place])   Obstructive sleep apnea of adult   Diabetic ulcer of left foot ([place])   Acute embolic stroke ([place])   Stroke ([place])  #1.  White matter small vessel disease. Paroxysmal atrial fibrillation. History of a stroke. Per neurology recommendation, no need to change anticoagulation.  Continue current treatment and physical therapy.  2.  Nonsustained ventricular tachycardia. Telemetry showed 8 beats of atrial tachycardia.  Patient is not symptomatic at that time.  Echocardiograms showed ejection fraction 60 to 65% no valvular abnormality. I will check potassium and magnesium level again tomorrow.  Continue beta-blocker.  3.  Essential hypertension. Continue home medicines.  4.  Left second and second toe wound. Bilateral feet pain. Appreciate podiatry consulted.  #5.  Type 2 diabetes. Continue current regimen.  6.  Obstructive sleep apnea. Started CPAP.    DVT prophylaxis:  Code Status:  Family Communication:   Disposition Plan:  .   Status is: Inpatient  Remains inpatient appropriate because:Unsafe d/c plan   Dispo: The patient is from: Home              Anticipated d/c is to: SNF              Anticipated d/c date is: 2 days              Patient currently is medically stable to d/c.        I/O last 3 completed shifts: In: 240 [P.O.:240] Out: 1400 [Urine:1400] Total I/O In: 480 [P.O.:480] Out: 600 [Urine:600]     Consultants:   Neurology and podiatry  Procedures: None  Antimicrobials: None  Subjective: Patient was nauseated this morning, she had a normal looking bowel movement afterwards.  Telemetry showed 8 beats of V. tach last night. Patient currently denies any short of breath or cough.  No chest pain. No abdominal pain, No fever chills P No dysuria hematuria.   Objective: Vitals:   04/09/20 0035 04/09/20 0617 04/09/20 0750 04/09/20 1155  BP: (!) 116/56 125/68 121/63 123/64  Pulse: 75 62 64 (!) 59  Resp: 18 18 18 16   Temp: 98.2 F (36.8 C) 98.3 F (36.8 C) 98.8 F (37.1 C) 98.3 F (36.8 C)  TempSrc:    Oral  SpO2: 97% 96% 97% 91%  Weight:      Height:        Intake/Output Summary (Last 24 hours) at 04/09/2020 1424 Last data filed at 04/09/2020 1414 Gross per 24 hour  Intake 720 ml  Output 2000 ml  Net -1280 ml   [employer]  04/08/20 1721  Weight: 111.3 kg    Examination:  General exam: Appears calm and comfortable, morbidly obese. Respiratory system: Clear to auscultation. Respiratory effort normal. Cardiovascular system: S1 & S2 heard, RRR. No JVD, murmurs, rubs, gallops or clicks. No pedal edema. Gastrointestinal system: Abdomen is nondistended, soft and nontender. No organomegaly or masses felt. Normal bowel sounds heard. Central nervous system: Alert and oriented. No focal neurological deficits. Extremities: Symmetric 5 x 5 power. Skin: No rashes, lesions or ulcers Psychiatry: Judgement and insight appear normal. Mood & affect  appropriate.     Data Reviewed: I have personally reviewed following labs and imaging studies  CBC: Recent Labs  Lab 04/05/20 1215  WBC 7.1  NEUTROABS 5.4  HGB 10.0*  HCT 32.9*  MCV 86.8  PLT [id number]   Basic Metabolic Panel: Recent Labs  Lab 04/05/20 1215 04/06/20 1627  NA 142 140  K 4.7 4.8  CL 104 102  CO2 26 27  GLUCOSE 102* 107*  BUN 25* 25*  CREATININE 1.13* 1.11*  CALCIUM 9.1 9.1   GFR: Estimated Creatinine Clearance (by C-G formula based on SCr of 1.11 mg/dL (H)) Female: 58.6 mL/min (A) Female: 71.3 mL/min (A) Liver Function Tests: Recent Labs  Lab 04/05/20 1215  AST 18  ALT 14  ALKPHOS 88  BILITOT 0.5  PROT 7.4  ALBUMIN 3.3*   No results for input(s): LIPASE, AMYLASE in the last 168 hours. No results for input(s): AMMONIA in the last 168 hours. Coagulation Profile: Recent Labs  Lab 04/06/20 1639  INR 1.6*   Cardiac Enzymes: No results for input(s): CKTOTAL, CKMB, CKMBINDEX, TROPONINI in the last 168 hours. BNP (last 3 results) No results for input(s): PROBNP in the last 8760 hours. HbA1C: Recent Labs    04/07/20 0500  HGBA1C 6.7*   CBG: Recent Labs  Lab 04/07/20 0751  GLUCAP 102*   Lipid Profile: Recent Labs    04/07/20 0500  CHOL 65  HDL 46  LDLCALC 9  TRIG 51  CHOLHDL 1.4   Thyroid Function Tests: No results for input(s): TSH, T4TOTAL, FREET4, T3FREE, THYROIDAB in the last 72 hours. Anemia Panel: No results for input(s): VITAMINB12, FOLATE, FERRITIN, TIBC, IRON, RETICCTPCT in the last 72 hours. Sepsis Labs: No results for input(s): PROCALCITON, LATICACIDVEN in the last 168 hours.  Recent Results (from the past 240 hour(s))  Resp Panel by RT-PCR (Flu A&B, Covid) Nasopharyngeal Swab     Status: None   Collection Time: 04/06/20  4:27 PM   Specimen: Nasopharyngeal Swab; Nasopharyngeal(NP) swabs in vial transport medium  Result Value Ref Range Status   SARS Coronavirus 2 by RT PCR NEGATIVE NEGATIVE Final    Comment:  (NOTE) SARS-CoV-2 target nucleic acids are NOT DETECTED.  The SARS-CoV-2 RNA is generally detectable in upper respiratory specimens during the acute phase of infection. The lowest concentration of SARS-CoV-2 viral copies this assay can detect is 138 copies/mL. A negative result does not preclude SARS-Cov-2 infection and should not be used as the sole basis for treatment or other patient management decisions. A negative result may occur with  improper specimen collection/handling, submission of specimen other than nasopharyngeal swab, presence of viral mutation(s) within the areas targeted by this assay, and inadequate number of viral copies(<138 copies/mL). A negative result must be combined with clinical observations, patient history, and epidemiological information. The expected result is Negative.  Fact Sheet for Patients:  EntrepreneurPulse.com.au  Fact Sheet for Healthcare Providers:  IncredibleEmployment.be  This test is no t yet approved or  cleared by the [place] and  has been authorized for detection and/or diagnosis of SARS-CoV-2 by FDA under an Emergency Use Authorization (EUA). This EUA will remain  in effect (meaning this test can be used) for the duration of the COVID-19 declaration under Section 564(b)(1) of the Act, 21 U.S.C.section 360bbb-3(b)(1), unless the authorization is terminated  or revoked sooner.       Influenza A by PCR NEGATIVE NEGATIVE Final   Influenza B by PCR NEGATIVE NEGATIVE Final    Comment: (NOTE) The Xpert Xpress SARS-CoV-2/FLU/RSV plus assay is intended as an aid in the diagnosis of influenza from Nasopharyngeal swab specimens and should not be used as a sole basis for treatment. Nasal washings and aspirates are unacceptable for Xpert Xpress SARS-CoV-2/FLU/RSV testing.  Fact Sheet for Patients: EntrepreneurPulse.com.au  Fact Sheet for Healthcare  Providers: IncredibleEmployment.be  This test is not yet approved or cleared by the [place] FDA and has been authorized for detection and/or diagnosis of SARS-CoV-2 by FDA under an Emergency Use Authorization (EUA). This EUA will remain in effect (meaning this test can be used) for the duration of the COVID-19 declaration under Section 564(b)(1) of the Act, 21 U.S.C. section 360bbb-3(b)(1), unless the authorization is terminated or revoked.  Performed at [facility], [address] [place],  [address]          Radiology Studies: MR ANGIO HEAD WO CONTRAST  Result Date: 04/08/2020 CLINICAL DATA:  70 year old female with lower extremity weakness, right basal ganglia lacunar infarct and posterior corpus callosum, periatrial infarcts on MRI 04/05/2020. EXAM: MRA HEAD WITHOUT CONTRAST TECHNIQUE: Angiographic images of the Circle of Willis were obtained using MRA technique without intravenous contrast. COMPARISON:  Head MRA 12/22/2019. FINDINGS: No intracranial mass effect or ventriculomegaly. Less motion artifact today compared to the October MRA. Stable antegrade flow in the posterior circulation with dominant appearing left vertebral artery. No distal vertebral stenosis. Patent vertebrobasilar junction. Patent basilar artery with mild to moderate irregularity but only mild stenosis which appears stable (series 1034, image 11). Patent SCA and PCA origins. Fetal type left PCA again noted. Right posterior communicating artery remains patent. Bilateral PCA branches are within normal limits. Stable antegrade flow in both ICA siphons. Probable artifact today at the right anterior genu, which was only mildly irregular in October, and supraclinoid flow signal appears stable. Left ICA siphon irregularity without stenosis. Carotid termini, MCA and ACA origins remain patent. Dominant left ACA as before. Anterior communicating artery and bilateral ACA branches are  stable and within normal limits. MCA M1 segments and MCA bifurcations remain patent. Mild irregularity and stenosis of left MCA M2 branches is more apparent today but stable, including the posterior left M2 seen on series 1024, image 5. IMPRESSION: 1. Intracranial MRA is negative for large vessel occlusion and appears stable since October. 2. Suspect artifact at the right anterior genu today. There does appear to be mild atherosclerotic stenosis of the: - mid Basilar Artery. - bilateral MCA M2 branches. Electronically Signed   By: Genevie Ann M.D.   On: 04/08/2020 10:09   US Carotid Bilateral  Result Date: 04/08/2020 CLINICAL DATA:  Cerebral infarction, hypertension, hyperlipidemia and diabetes. EXAM: BILATERAL CAROTID DUPLEX ULTRASOUND TECHNIQUE: Pearline Cables scale imaging, color Doppler and duplex ultrasound were performed of bilateral carotid and vertebral arteries in the neck. COMPARISON:  None. FINDINGS: Criteria: Quantification of carotid stenosis is based on velocity parameters that correlate the residual internal carotid diameter with NASCET-based stenosis levels, using the diameter of the  distal internal carotid lumen as the denominator for stenosis measurement. The following velocity measurements were obtained: RIGHT ICA:  58/16 cm/sec CCA:  [id number] cm/sec SYSTOLIC ICA/CCA RATIO:  0.9 ECA:  70 cm/sec LEFT ICA:  64/22 cm/sec CCA:  [id number] cm/sec SYSTOLIC ICA/CCA RATIO:  1.0 ECA:  53 cm/sec RIGHT CAROTID ARTERY: There is a mild amount of partially calcified plaque at the level of the distal bulb and proximal right ICA. Estimated right ICA stenosis is less than 50%. RIGHT VERTEBRAL ARTERY: Antegrade flow with normal waveform and velocity. LEFT CAROTID ARTERY: No focal plaque identified. No evidence of left carotid stenosis. LEFT VERTEBRAL ARTERY: Antegrade flow with normal waveform and velocity. IMPRESSION: 1. Mild plaque at the level of the right carotid bifurcation with estimated proximal right ICA stenosis of less  than 50%. 2. No evidence of left-sided carotid plaque or stenosis in the neck. Electronically Signed   By: Aletta Edouard M.D.   On: 04/08/2020 11:02        Scheduled Meds: .  stroke: mapping our early stages of recovery book   Does not apply Once  . amLODipine  5 mg Oral Daily  . apixaban  5 mg Oral BID  . vitamin C  250 mg Oral BID  . bacitracin   Topical BID  . buPROPion  150 mg Oral Daily  . cholecalciferol  1,000 Units Oral BID WC  . DULoxetine  60 mg Oral Daily  . feeding supplement  237 mL Oral Q24H  . fesoterodine  8 mg Oral Daily  . folic acid  1 mg Oral Daily  . losartan  50 mg Oral Daily   And  . hydrochlorothiazide  12.5 mg Oral Daily  . [START ON 04/10/2020] ipratropium-albuterol  3 mL Nebulization BID  . levothyroxine  175 mcg Oral Q0600  . linagliptin  5 mg Oral Daily  . metFORMIN  500 mg Oral Q breakfast  . metoprolol succinate  50 mg Oral Daily  . mometasone-formoterol  2 puff Inhalation BID  . multivitamin with minerals  1 tablet Oral Daily  . oxybutynin  10 mg Oral QHS  . rosuvastatin  20 mg Oral Daily  . sucralfate  1 g Oral TID WC & HS  . thiamine  50 mg Oral BID WC  . cyanocobalamin  1,000 mcg Oral Daily  . [START ON 04/12/2020] Vitamin D (Ergocalciferol)  50,000 Units Oral Weekly   Continuous Infusions:   LOS: 2 days    Time spent: 28 minutes    Sharen Hones, MD [facility]   To contact the attending provider between 7A-7P or the covering provider during after hours 7P-7A, please log into the web site [email or web address] and access using universal [place] password for that web site. If you do not have the password, please call the hospital operator.  04/09/2020, 2:24 PM

## 2020-04-09 NOTE — Consult Note (Signed)
PODIATRY / FOOT AND ANKLE SURGERY CONSULTATION NOTE  Requesting Physician: Sharen Hones, MD  Reason for consult: Foot pain, left foot wounds  Chief Complaint: Foot pain and wounds   HPI: [name] is a 70 y.o. adult who presented to [facility] due to concerns for new onset stroke.  Patient has been worked up for this and is being treated currently.  Patient has some weakness to bilateral lower extremities.  Podiatry team was consulted for wounds to left second toe as well as left posterior heel and plantar foot pain bilateral.  Patient notes that she was able to walk about a month ago but now she states that any weight that she puts on her feet hurts.  She notes that she has been for the most part nonambulatory for the past month or so.  Patient is a long-term patient of Dr. Selina Cooley.  Patient currently denies nausea, vomiting, fever, chills.  PMHx:  Past Medical History:  Diagnosis Date  . Abnormal antibody titer   . Anxiety and depression   . Arthritis   . Asthma   . Cystocele   . Depression   . Diabetes ([place])   . DVT (deep venous thrombosis) (HCC)    Righ calf  . Dyspnea    11/08/2018 - "more now due to lack of exercise"  . Dysrhythmia    afib  . Edema   . GERD (gastroesophageal reflux disease)   . Heart murmur    Echocardiogram June 2015: Mild MR (possible vegetation seen on TTE, not seen on TEE), normal LV size with moderate concentric LVH. Normal function EF 60-65%. Normal diastolic function. Mild LA dilation.  Marland Kitchen History of IBS   . History of kidney stones   . History of methicillin resistant staphylococcus aureus (MRSA) 2008  . Hyperlipidemia   . Hypertension   . Hypothyroidism   . Insomnia   . Kidney stone    kidney stones with lithotripsy .  [facility]- "adrenal glands"  . Neuropathy involving both lower extremities   . Obesity, Class II, BMI 35-39.9, with comorbidity   . Paroxysmal atrial fibrillation ([place]) 2007   In June 2015, Cardiac Event Monitor: Mostly  SR/sinus arrhythmia with PVCs that are frequent. Short bursts of A. fib lasting several minutes;; CHA2DS2-VASc Score = 5 (age, Female, PVD, DM, HTN)  . Peripheral vascular disease ([place])   . Sleep apnea    use C-PAP  . Urinary incontinence   . Venous stasis dermatitis of both lower extremities     Surgical Hx:  Past Surgical History:  Procedure Laterality Date  . ANKLE SURGERY Right   . APPLICATION OF WOUND VAC Left 06/27/2015   Procedure: APPLICATION OF WOUND VAC ( POSSIBLE ) ;  Surgeon: [name], MD;  Location: [facility] ORS;  Service: Vascular;  Laterality: Left;  . APPLICATION OF WOUND VAC Left 09/25/2016   Procedure: APPLICATION OF WOUND VAC;  Surgeon: [name], DPM;  Location: [facility];  Service: Podiatry;  Laterality: Left;  . arm surgery     right    . CHOLECYSTECTOMY    . COLONOSCOPY    . COLONOSCOPY WITH PROPOFOL N/A 01/11/2017   Procedure: COLONOSCOPY WITH PROPOFOL;  Surgeon: [name], MD;  Location: [facility]COPY;  Service: Endoscopy;  Laterality: N/A;  . COLONOSCOPY WITH PROPOFOL N/A 02/22/2017   Procedure: COLONOSCOPY WITH PROPOFOL;  Surgeon: [name], MD;  Location: [facility] ENDOSCOPY;  Service: Endoscopy;  Laterality: N/A;  . COLONOSCOPY WITH PROPOFOL N/A 05/31/2017  Procedure: COLONOSCOPY WITH PROPOFOL;  Surgeon: [name], MD;  Location: [facility] ENDOSCOPY;  Service: Endoscopy;  Laterality: N/A;  . ESOPHAGOGASTRODUODENOSCOPY (EGD) WITH PROPOFOL N/A 01/11/2017   Procedure: ESOPHAGOGASTRODUODENOSCOPY (EGD) WITH PROPOFOL;  Surgeon: [name], MD;  Location: [facility] ENDOSCOPY;  Service: Endoscopy;  Laterality: N/A;  . ESOPHAGOGASTRODUODENOSCOPY (EGD) WITH PROPOFOL N/A 10/25/2018   Procedure: ESOPHAGOGASTRODUODENOSCOPY (EGD) WITH PROPOFOL;  Surgeon: [name], MD;  Location: [facility] ENDOSCOPY;  Service: Endoscopy;  Laterality: N/A;  . ESOPHAGOGASTRODUODENOSCOPY (EGD) WITH PROPOFOL N/A 11/29/2019   Procedure: ESOPHAGOGASTRODUODENOSCOPY (EGD)  WITH PROPOFOL;  Surgeon: Toledo, Benay Pike, MD;  Location: [facility] ENDOSCOPY;  Service: Gastroenterology;  Laterality: N/A;  . HERNIA REPAIR     umbilical  . HIATAL HERNIA REPAIR    . I & D EXTREMITY Left 06/27/2015   Procedure: IRRIGATION AND DEBRIDEMENT EXTREMITY            ( CALF HEMATOMA ) POSSIBLE WOUND VAC;  Surgeon: [name], MD;  Location: [facility] ORS;  Service: Vascular;  Laterality: Left;  . INCISION AND DRAINAGE OF WOUND Left 09/25/2016   Procedure: IRRIGATION AND DEBRIDEMENT - PARTIAL RESECTION OF ACHILLES TENDON WITH WOUND VAC APPLICATION;  Surgeon: [name], DPM;  Location: [facility] ORS;  Service: Podiatry;  Laterality: Left;  . INCISION AND DRAINAGE OF WOUND Left 11/09/2018   Procedure: Excision of left foot wound with ACell placement;  Surgeon: Wallace Going, DO;  Location: [place];  Service: Plastics;  Laterality: Left;  . IRRIGATION AND DEBRIDEMENT ABSCESS Left 09/07/2016   Procedure: IRRIGATION AND DEBRIDEMENT ABSCESS LEFT HEEL;  Surgeon: [name], DPM;  Location: [facility];  Service: Podiatry;  Laterality: Left;  . JOINT REPLACEMENT  2013   left knee replacement  . KIDNEY SURGERY Right    kidney stones  . LITHOTRIPSY    . LOWER EXTREMITY ANGIOGRAPHY Left 04/04/2018   Procedure: LOWER EXTREMITY ANGIOGRAPHY;  Surgeon: [name], MD;  Location: [facility];  Service: Cardiovascular;  Laterality: Left;  . NM GATED MYOVIEW ([place] HX)  February 2017   Likely breast attenuation. LOW RISK study. Normal EF 55-60%.  . OTHER SURGICAL HISTORY     08/2016 or 09/2016 surgery on achilles tenso h/o staph infection heal. Dr. [name]  . removal of left hematoma Left    leg  . REVERSE SHOULDER ARTHROPLASTY Right 10/08/2015   Procedure: REVERSE SHOULDER ARTHROPLASTY;  Surgeon: [name], MD;  Location: [facility] OR[facility];  Service: Orthopedics;  Laterality: Right;  . SLEEVE GASTROPLASTY    . TONSILLECTOMY    . TOTAL KNEE ARTHROPLASTY Left   . TOTAL SHOULDER REPLACEMENT Right   .  TRANSESOPHAGEAL ECHOCARDIOGRAM  08/08/2013   Mild LVH, EF 60-65%. Moderate LA dilation and mild RA dilation. Mild MR with no evidence of stenosis and no evidence of endocarditis. A false chordae is noted.  . TRANSTHORACIC ECHOCARDIOGRAM  08/03/2013   Mild-Moderate concentric LVH, EF 60-65%. Normal diastolic function. Mild LA dilation. Mild MR with possible vegetation  - not confirmed on TEE   . ULNAR NERVE TRANSPOSITION Right 08/06/2016   Procedure: ULNAR NERVE DECOMPRESSION/TRANSPOSITION;  Surgeon: [name], MD;  Location: [facility];  Service: Orthopedics;  Laterality: Right;  . UPPER GI ENDOSCOPY    . VAGINAL HYSTERECTOMY      FHx:  Family History  Problem Relation Age of Onset  . Skin cancer Father   . Diabetes Father   . Hypertension Father   . Peripheral vascular disease Father   .  Cancer Father   . Cerebral aneurysm Father   . Alcohol abuse Father   . Varicose Veins Mother   . Kidney disease Mother   . Arthritis Mother   . Mental illness Sister   . Cancer Maternal Aunt        breast  . Breast cancer Maternal Aunt   . Arthritis Maternal Grandmother   . Hypertension Maternal Grandmother   . Diabetes Maternal Grandmother   . Arthritis Maternal Grandfather   . Heart disease Maternal Grandfather   . Hypertension Maternal Grandfather   . Arthritis Paternal Grandmother   . Hypertension Paternal Grandmother   . Diabetes Paternal Grandmother   . Arthritis Paternal Grandfather   . Hypertension Paternal Grandfather   . Bladder Cancer Neg Hx   . Kidney cancer Neg Hx     Social History:  reports that she has never smoked. She has never used smokeless tobacco. She reports that she does not drink alcohol and does not use drugs.  Allergies:  Allergies  Allergen Reactions  . Morphine And Related Other (See Comments) and Nausea Only    Patient becomes very confused Other reaction(s): Other (See Comments), Other (See Comments) Patient becomes very confused Patient becomes  very confused   . Penicillins Hives, Shortness Of Breath, Swelling and Other (See Comments)    Facial swelling Has patient had a PCN reaction causing immediate rash, facial/tongue/throat swelling, SOB or lightheadedness with hypotension: Yes Has patient had a PCN reaction causing severe rash involving mucus membranes or skin necrosis: Yes Has patient had a PCN reaction that required hospitalization No Has patient had a PCN reaction occurring within the last 10 years: No If all of the above answers are "NO", then may proceed with Cephalosporin use.  Other reaction(s): Other (See Comments), Other (See Comments) Facial swelling Has patient had a PCN reaction causing immediate rash, facial/tongue/throat swelling, SOB or lightheadedness with hypotension: Yes Has patient had a PCN reaction causing severe rash involving mucus membranes or skin necrosis: Yes Has patient had a PCN reaction that required hospitalization No Has patient had a PCN reaction occurring within the last 10 years: No If all of the above answers are "NO", then may proceed with Cephalosporin use. Facial swelling Has patient had a PCN reaction causing immediate rash, facial/tongue/throat swelling, SOB or lightheadedness with hypotension: Yes Has patient had a PCN reaction causing severe rash involving mucus membranes or skin necrosis: Yes Has patient had a PCN reaction that required hospitalization No Has patient had a PCN reaction occurring within the last 10 years: No If all of the above answers are "NO", then may proceed with Cephalosporin use. Facial swelling Has patient had a PCN reaction causing immediate rash, facial/tongue/throat swelling, SOB or lightheadedness with hypotension: Yes Has patient had a PCN reaction causing severe rash involving mucus membranes or skin necrosis: Yes Has patient had a PCN reaction that required hospitalization No Has patient had a PCN reaction occurring within the last 10 years: No If all  of the above answers are "NO", then may proceed with Cephalosporin use.   . Ambien [Zolpidem]     Hallucination   . Aspirin Hives  . Oxytetracycline Hives    Other reaction(s): Unknown     Review of Systems: General ROS: positive for  - fatigue Respiratory ROS: no cough, shortness of breath, or wheezing Cardiovascular ROS: no chest pain or dyspnea on exertion Gastrointestinal ROS: no abdominal pain, change in bowel habits, or black or bloody stools Musculoskeletal ROS: positive  for - gait disturbance, joint pain, joint stiffness, muscular weakness and pain in foot - bilateral Neurological ROS: positive for - gait disturbance, numbness/tingling and weakness Dermatological ROS: positive for Wounds to left second toe and posterior heel  Facility-Administered Medications Prior to Admission  Medication Dose Route Frequency Provider Last Rate Last Admin  . ipratropium-albuterol (DUONEB) 0.5-2.5 (3) MG/3ML nebulizer solution 3 mL  3 mL Nebulization Q6H [name], [name], MD       Medications Prior to Admission  Medication Sig Dispense Refill  . acetaminophen (TYLENOL) 325 MG tablet Take 2 tablets (650 mg total) by mouth every 6 (six) hours as needed for mild pain or moderate pain (or Fever >/= 101).    Marland Kitchen albuterol (PROVENTIL) (2.5 MG/3ML) 0.083% nebulizer solution Take 2.5 mg by nebulization every 4 (four) hours as needed for wheezing or shortness of breath.    . ALPRAZolam (XANAX) 0.5 MG tablet Take 0.5 mg by mouth at bedtime as needed for anxiety or sleep.    Marland Kitchen amLODipine (NORVASC) 5 MG tablet Take 1 tablet (5 mg total) by mouth daily. 90 tablet 1  . apixaban (ELIQUIS) 5 MG TABS tablet Take 1 tablet (5 mg total) by mouth 2 (two) times daily. 180 tablet 2  . buPROPion (WELLBUTRIN XL) 150 MG 24 hr tablet Take 1 tablet (150 mg total) by mouth daily. 90 tablet 3  . calcium-vitamin D (OSCAL WITH D) 500-200 MG-UNIT tablet Take 1 tablet by mouth 2 (two) times daily with a meal. 60 tablet 0   . cholecalciferol (VITAMIN D) 25 MCG tablet Take 1 tablet (1,000 Units total) by mouth 2 (two) times daily with a meal. 30 tablet 2  . clobetasol cream (TEMOVATE) 3.29 % Apply 1 application topically 2 (two) times daily as needed (skin irritation).     . colchicine 0.6 MG tablet Take 0.6 mg by mouth 2 (two) times daily as needed (acute gout flare).     . diclofenac Sodium (VOLTAREN) 1 % GEL Apply 2 g topically 3 (three) times daily. 100 g 2  . DULoxetine (CYMBALTA) 60 MG capsule Take 1 capsule (60 mg total) by mouth daily. 30 capsule 2  . ergocalciferol (VITAMIN D2) 1.25 MG (50000 UT) capsule Take 50,000 Units by mouth once a week.    . estradiol (ESTRACE) 0.1 MG/GM vaginal cream Place 1 Applicatorful vaginally every Monday, Wednesday, and Friday. Apply 0.31m (pea-sized amount)  just inside the vaginal introitus with a finger-tip on Monday, Wednesday and Friday nights, 42.5 g 11  . fluticasone-salmeterol (ADVAIR HFA) 115-21 MCG/ACT inhaler Inhale 2 puffs into the lungs daily. Rinse mouth thoroughly after use 1 Inhaler 12  . folic acid (FOLVITE) 1 MG tablet Take 1 tablet (1 mg total) by mouth daily. 30 tablet 2  . HYDROcodone-acetaminophen (NORCO/VICODIN) 5-325 MG tablet Take 1 tablet by mouth every 8 (eight) hours as needed for severe pain. Must last 30 days 90 tablet 0  . levocetirizine (XYZAL) 5 MG tablet Take 0.5 tablets (2.5 mg total) by mouth every evening. (Patient taking differently: Take 5 mg by mouth at bedtime as needed for allergies.) 45 tablet 3  . levothyroxine (SYNTHROID) 175 MCG tablet Take 1 tablet (175 mcg total) by mouth daily before breakfast. 90 tablet 1  . losartan-hydrochlorothiazide (HYZAAR) 50-12.5 MG tablet Take 1 tablet by mouth daily.    . metFORMIN (GLUCOPHAGE) 500 MG tablet Take 1 tablet (500 mg total) by mouth daily with breakfast. With food 90 tablet 3  . metoprolol succinate (TOPROL-XL) 50  MG 24 hr tablet TAKE 1 TABLET BY MOUTH DAILY (Patient taking differently: Take  50 mg by mouth daily.) 90 tablet 1  . Misc. Devices (TUB TRANSFER BOARD) MISC 1 Device by Does not apply route as needed. 1 each 0  . montelukast (SINGULAIR) 10 MG tablet Take 1 tablet (10 mg total) by mouth at bedtime. 90 tablet 1  . Multiple Vitamin (MULTIVITAMIN WITH MINERALS) TABS tablet Take 1 tablet by mouth daily.    . ondansetron (ZOFRAN) 4 MG tablet Take 1 tablet (4 mg total) by mouth every 8 (eight) hours as needed for nausea or vomiting. 90 tablet 1  . oxybutynin (DITROPAN-XL) 10 MG 24 hr tablet Take 10 mg by mouth at bedtime.    . pantoprazole (PROTONIX) 40 MG tablet Take 40 mg by mouth daily.     . pregabalin (LYRICA) 50 MG capsule Take 1 capsule (50 mg total) by mouth 3 (three) times daily. 90 capsule 2  . rosuvastatin (CRESTOR) 20 MG tablet Take 1 tablet (20 mg total) by mouth daily. 30 tablet 5  . sitaGLIPtin (JANUVIA) 25 MG tablet Take 1 tablet (25 mg total) by mouth daily. 90 tablet 3  . sucralfate (CARAFATE) 1 GM/10ML suspension Take 1 g by mouth 3 (three) times daily.     Marland Kitchen thiamine 100 MG tablet Take 0.5 tablets (50 mg total) by mouth 2 (two) times daily with a meal. 60 tablet 2  . TOVIAZ 8 MG TB24 tablet TAKE ONE TABLET BY MOUTH EVERY DAY (Patient taking differently: No sig reported) 90 tablet 3  . trospium (SANCTURA) 20 MG tablet Take 20 mg by mouth 2 (two) times daily.    . vitamin B-12 1000 MCG tablet Take 1 tablet (1,000 mcg total) by mouth daily. 30 tablet 2  . HYDROcodone-acetaminophen (NORCO/VICODIN) 5-325 MG tablet Take 1 tablet by mouth every 8 (eight) hours as needed for severe pain. Must last 30 days 90 tablet 0  . [START ON 04/19/2020] HYDROcodone-acetaminophen (NORCO/VICODIN) 5-325 MG tablet Take 1 tablet by mouth every 8 (eight) hours as needed for severe pain. Must last 30 days 90 tablet 0    Physical Exam: General: Alert and oriented.  No apparent distress.  Vascular: DP/PT pulses faintly palpable bilateral, capillary fill time appears to be intact to digits  bilateral.  No hair growth noted to both feet bilateral.  Mild bilateral lower extremity nonpitting edema.  Neuro: Light touch sensation and diminished to bilateral lower extremities.  Derm: Left posterior heel ulceration which appears to go to the subcutaneous tissue and dermis which measures approximately 1 cm x 0.7 cm x 0.1 cm, wound bed appears to be fiber granular overall, no erythema, no edema, no drainage, no signs of infection present.  Left second toe distal skin ulceration/blister with dried blood present around the periphery of the blister, appears to be stable at this time with no signs of infection present, appears to be very superficial.  Skin appears to be thin and atrophic to bilateral lower extremities.  MSK: 4/5 strength bilateral lower extremities.  Pain on palpation of the plantar medial tubercle of the calcaneal tuberosity at the origin of the plantar fascia bilaterally.  Patient has limited ankle joint dorsiflexion with knee extended but improved with knee flexion bilateral.  Moderate to severe pes planus foot type bilateral.  Results for orders placed or performed during the hospital encounter of 04/06/20 (from the past 48 hour(s))  Urine Drug Screen, Qualitative     Status: Abnormal   Collection  Time: 04/07/20 10:15 AM  Result Value Ref Range   Tricyclic, Ur Screen NONE DETECTED NONE DETECTED   Amphetamines, Ur Screen NONE DETECTED NONE DETECTED   MDMA (Ecstasy)Ur Screen NONE DETECTED NONE DETECTED   Cocaine Metabolite,Ur [place] NONE DETECTED NONE DETECTED   Opiate, Ur Screen POSITIVE (A) NONE DETECTED   Phencyclidine (PCP) Ur S NONE DETECTED NONE DETECTED   Cannabinoid 50 Ng, Ur [place] NONE DETECTED NONE DETECTED   Barbiturates, Ur Screen NONE DETECTED NONE DETECTED   Benzodiazepine, Ur Scrn POSITIVE (A) NONE DETECTED   Methadone Scn, Ur NONE DETECTED NONE DETECTED    Comment: (NOTE) Tricyclics + metabolites, urine    Cutoff 1000 ng/mL Amphetamines + metabolites, urine   Cutoff 1000 ng/mL MDMA (Ecstasy), urine              Cutoff 500 ng/mL Cocaine Metabolite, urine          Cutoff 300 ng/mL Opiate + metabolites, urine        Cutoff 300 ng/mL Phencyclidine (PCP), urine         Cutoff 25 ng/mL Cannabinoid, urine                 Cutoff 50 ng/mL Barbiturates + metabolites, urine  Cutoff 200 ng/mL Benzodiazepine, urine              Cutoff 200 ng/mL Methadone, urine                   Cutoff 300 ng/mL  The urine drug screen provides only a preliminary, unconfirmed analytical test result and should not be used for non-medical purposes. Clinical consideration and professional judgment should be applied to any positive drug screen result due to possible interfering substances. A more specific alternate chemical method must be used in order to obtain a confirmed analytical result. Gas chromatography / mass spectrometry (GC/MS) is the preferred confirm atory method. Performed at [facility], [address] [place], [place] [address]   Urinalysis, Routine w reflex microscopic     Status: Abnormal   Collection Time: 04/07/20 10:15 AM  Result Value Ref Range   Color, Urine YELLOW (A) YELLOW   APPearance CLEAR (A) CLEAR   Specific Gravity, Urine 1.008 1.005 - 1.030   pH 5.0 5.0 - 8.0   Glucose, UA NEGATIVE NEGATIVE mg/dL   Hgb urine dipstick NEGATIVE NEGATIVE   Bilirubin Urine NEGATIVE NEGATIVE   Ketones, ur NEGATIVE NEGATIVE mg/dL   Protein, ur NEGATIVE NEGATIVE mg/dL   Nitrite NEGATIVE NEGATIVE   Leukocytes,Ua NEGATIVE NEGATIVE    Comment: Performed at [facility], [address] [place], [place] [address]  C-reactive protein     Status: Abnormal   Collection Time: 04/08/20 12:53 AM  Result Value Ref Range   CRP 3.1 (H) <1.0 mg/dL    Comment: Performed at [facility] [address], [place], [place] [address]  ESR     Status: Abnormal   Collection Time: 04/08/20 12:54 AM  Result Value Ref Range   Sed Rate 60 (H) 0 - 30  mm/hr    Comment: Performed at [facility] [address] [place], [place] [address]   MR ANGIO HEAD WO CONTRAST  Result Date: 04/08/2020 CLINICAL DATA:  70 year old female with lower extremity weakness, right basal ganglia lacunar infarct and posterior corpus callosum, periatrial infarcts on MRI 04/05/2020. EXAM: MRA HEAD WITHOUT CONTRAST TECHNIQUE: Angiographic images of the Circle of Willis were obtained using MRA technique without intravenous  contrast. COMPARISON:  Head MRA 12/22/2019. FINDINGS: No intracranial mass effect or ventriculomegaly. Less motion artifact today compared to the October MRA. Stable antegrade flow in the posterior circulation with dominant appearing left vertebral artery. No distal vertebral stenosis. Patent vertebrobasilar junction. Patent basilar artery with mild to moderate irregularity but only mild stenosis which appears stable (series 1034, image 11). Patent SCA and PCA origins. Fetal type left PCA again noted. Right posterior communicating artery remains patent. Bilateral PCA branches are within normal limits. Stable antegrade flow in both ICA siphons. Probable artifact today at the right anterior genu, which was only mildly irregular in October, and supraclinoid flow signal appears stable. Left ICA siphon irregularity without stenosis. Carotid termini, MCA and ACA origins remain patent. Dominant left ACA as before. Anterior communicating artery and bilateral ACA branches are stable and within normal limits. MCA M1 segments and MCA bifurcations remain patent. Mild irregularity and stenosis of left MCA M2 branches is more apparent today but stable, including the posterior left M2 seen on series 1024, image 5. IMPRESSION: 1. Intracranial MRA is negative for large vessel occlusion and appears stable since October. 2. Suspect artifact at the right anterior genu today. There does appear to be mild atherosclerotic stenosis of the: - mid Basilar Artery. - bilateral MCA M2  branches. Electronically Signed   By: Genevie Ann M.D.   On: 04/08/2020 10:09   US Carotid Bilateral  Result Date: 04/08/2020 CLINICAL DATA:  Cerebral infarction, hypertension, hyperlipidemia and diabetes. EXAM: BILATERAL CAROTID DUPLEX ULTRASOUND TECHNIQUE: Pearline Cables scale imaging, color Doppler and duplex ultrasound were performed of bilateral carotid and vertebral arteries in the neck. COMPARISON:  None. FINDINGS: Criteria: Quantification of carotid stenosis is based on velocity parameters that correlate the residual internal carotid diameter with NASCET-based stenosis levels, using the diameter of the distal internal carotid lumen as the denominator for stenosis measurement. The following velocity measurements were obtained: RIGHT ICA:  58/16 cm/sec CCA:  09/47 cm/sec SYSTOLIC ICA/CCA RATIO:  0.9 ECA:  70 cm/sec LEFT ICA:  64/22 cm/sec CCA:  09/62 cm/sec SYSTOLIC ICA/CCA RATIO:  1.0 ECA:  53 cm/sec RIGHT CAROTID ARTERY: There is a mild amount of partially calcified plaque at the level of the distal bulb and proximal right ICA. Estimated right ICA stenosis is less than 50%. RIGHT VERTEBRAL ARTERY: Antegrade flow with normal waveform and velocity. LEFT CAROTID ARTERY: No focal plaque identified. No evidence of left carotid stenosis. LEFT VERTEBRAL ARTERY: Antegrade flow with normal waveform and velocity. IMPRESSION: 1. Mild plaque at the level of the right carotid bifurcation with estimated proximal right ICA stenosis of less than 50%. 2. No evidence of left-sided carotid plaque or stenosis in the neck. Electronically Signed   By: Aletta Edouard M.D.   On: 04/08/2020 11:02    Blood pressure 121/63, pulse 64, temperature 98.8 F (37.1 C), resp. rate 18, height '5\' 5"'  (1.651 m), weight 111.3 kg, SpO2 97 %.  Assessment 1. Diabetic/neuropathic ulcerations to the left second toe and posterior left heel, stable 2. Chronic foot pain bilateral secondary to potential plantar fasciitis and fat pad  atrophy  Plan -Patient seen and evaluated -Discussed etiology of patient's pain discomfort which is likely multifactorial and related to neuropathic condition as well as potentially chronic plantar fasciitis and fat pad atrophy to the bottoms of both feet. -Discussed need for further treatment with physical therapy to work on gait training as well as stretching exercises to try to relieve pain or discomfort.  Do not believe steroid injections  are indicated as patient has had wound in the heel before and do not want to introduce any infection to the area.  Patient understands. -Recommend rest, ice, compression, elevation and use of Tylenol or NSAIDs for relief as needed.  Instructed patient that it may be painful to walk on but she really needs to try to get up and move around a lot more.  When the patient is up on her feet quite a bit though she has had a recurrent left plantar heel ulceration which currently appears to be closed at this time. -Redress wounds to the left second toe and left posterior heel with Betadine followed by 4 x 4 gauze, ABD, Kerlix, Ace wrap.  Patient should have daily dressing changes with saline wet-to-dry dressings to the posterior aspect of the left heel and iodine to the left second toe. -Patient may be weightbearing as tolerated.  Would like the patient to likely use a boot on the left side to limit the amount of Achilles tendon excursion which will likely cause the wound to worsen.  Patient is severely deconditioned at this time and needs to get up and move around more.  Appreciate recommendations from PT and OT. -Podiatry team will follow-up as needed at this point in the hospital, will sign off on patient and reconsult if any further problems arise.  Discharge instructions placed in chart.  Caroline More, DPM 04/09/2020, 9:04 AM

## 2020-04-09 NOTE — Progress Notes (Signed)
PMT consult received and chart reviewed. Due to high volume of referrals, there is a delay in seeing this patient. PMT provider will see in AM 1/19. Thank you.   NO CHARGE  Ihor Dow, [place], FNP-C Palliative Medicine Team  Phone: [phone] Fax: [phone]

## 2020-04-09 NOTE — Progress Notes (Signed)
Physical Therapy Treatment Patient Details Name: [name] MRN: [id number] DOB: February 11, 1951 Today's Date: 04/09/2020    History of Present Illness Pt is a 70 y/o F admitted on 04/06/20 after presenting to ED for further evaluation of a new stroke by guidance of her PCP. MRI revealed recurrent stroke/acute punctate focus of restricted diffusion in the right external capsule region. PMH: CVA (October & November 2021), HTN, DM2, morbid obesity, chronic pain, paroxysmal a-fib, DVT, OSA on CPAP, raynaud's phenomenon, anxiety, depression, asthma, cystocele, dyspnea, GERD, heart murmur, IBS, HLD, BLE neuropathy, obesity, PVD, urinary incontinence    PT Comments    Upon entrance to room patient being assisted with 2 NTs to get cleaned up, rolling in bed. Patient requires mod assist to roll. She is declining sitting up on side of bed or attempting to stand at this time. She does agree to bed exercises. Reports continued pain in B feet. States she sat at edge of bed while husband was here. Patient reports her sons basically carried her around since 69. Has hoyer, w/c and hospital bed at home. Patient will continue to benefit from skilled PT while here to improve functional independence to reduce caregiver burden.          Follow Up Recommendations  SNF;Supervision/Assistance - 24 hour     Equipment Recommendations  None recommended by PT    Recommendations for Other Services       Precautions / Restrictions Precautions Precautions: Fall Restrictions Weight Bearing Restrictions: No Other Position/Activity Restrictions: podiatrist would like gastroc/soleus muscle stretching, thinking foot pain is plantar fascitis.    Mobility  Bed Mobility Overal bed mobility: Needs Assistance Bed Mobility: Rolling Rolling: Mod assist         General bed mobility comments: patient declines oob activity this pm. Performed rolling to get sheets changed with NT prior to/ as I arrived  Transfers                  General transfer comment: patient declines attempts at standing this pm. States she is better earlier in the day (when i attempted earlier today she was not feeling well.)  Ambulation/Gait             General Gait Details: unable   Stairs             Wheelchair Mobility    Modified Rankin (Stroke Patients Only)       Balance                                            Cognition Arousal/Alertness: Awake/alert Behavior During Therapy: WFL for tasks assessed/performed Overall Cognitive Status: Within Functional Limits for tasks assessed                                        Exercises Other Exercises Other Exercises: B LE exercises: AP, heel slides, hip abd/add, SLR, SAQ x 10 reps each. Gentle stretching of B heel cords    General Comments        Pertinent Vitals/Pain Pain Assessment: Faces Faces Pain Scale: Hurts even more Pain Location: B feet Pain Descriptors / Indicators: Grimacing;Discomfort Pain Intervention(s): Monitored during session;Repositioned    Home Living  Prior Function            PT Goals (current goals can now be found in the care plan section) Acute Rehab PT Goals Patient Stated Goal: to walk again PT Goal Formulation: With patient Time For Goal Achievement: 04/21/20 Potential to Achieve Goals: Fair Progress towards PT goals: Not progressing toward goals - comment    Frequency    Min 2X/week      PT Plan Current plan remains appropriate    Co-evaluation              AM-PAC PT "6 Clicks" Mobility   Outcome Measure  Help needed turning from your back to your side while in a flat bed without using bedrails?: Total Help needed moving from lying on your back to sitting on the side of a flat bed without using bedrails?: Total Help needed moving to and from a bed to a chair (including a wheelchair)?: Total Help needed standing up from  a chair using your arms (e.g., wheelchair or bedside chair)?: Total Help needed to walk in hospital room?: Total Help needed climbing 3-5 steps with a railing? : Total 6 Click Score: 6    End of Session     Patient left: in bed;with call bell/phone within reach;with bed alarm set Nurse Communication: Mobility status PT Visit Diagnosis: Difficulty in walking, not elsewhere classified (R26.2);Pain;Other abnormalities of gait and mobility (R26.89) Pain - Right/Left:  (bilateral) Pain - part of body: Ankle and joints of foot     Time: 8185-6314 PT Time Calculation (min) (ACUTE ONLY): 21 min  Charges:  $Therapeutic Exercise: 8-22 mins                     [employer], PT, G[employer] 04/09/20,3:21 PM

## 2020-04-09 NOTE — Progress Notes (Signed)
PT Cancellation Note  Patient Details Name: COTINA FREEDMAN MRN: [id number] DOB: 1950-11-18   Cancelled Treatment:    Reason Eval/Treat Not Completed: Patient declined, no reason specified. Patient reports she is sick on her stomach. Declines all activity at this time. Will return later if time allows.    Venissa Nappi 04/09/2020, 10:47 AM

## 2020-04-10 ENCOUNTER — Inpatient Hospital Stay: Payer: Medicare HMO

## 2020-04-10 DIAGNOSIS — I480 Paroxysmal atrial fibrillation: Secondary | ICD-10-CM | POA: Diagnosis not present

## 2020-04-10 DIAGNOSIS — Z515 Encounter for palliative care: Secondary | ICD-10-CM

## 2020-04-10 DIAGNOSIS — E11621 Type 2 diabetes mellitus with foot ulcer: Secondary | ICD-10-CM | POA: Diagnosis not present

## 2020-04-10 DIAGNOSIS — I472 Ventricular tachycardia: Secondary | ICD-10-CM | POA: Diagnosis not present

## 2020-04-10 DIAGNOSIS — L97422 Non-pressure chronic ulcer of left heel and midfoot with fat layer exposed: Secondary | ICD-10-CM | POA: Diagnosis not present

## 2020-04-10 DIAGNOSIS — E1142 Type 2 diabetes mellitus with diabetic polyneuropathy: Secondary | ICD-10-CM | POA: Diagnosis not present

## 2020-04-10 LAB — DRVVT CONFIRM: dRVVT Confirm: 0.8 ratio (ref 0.8–1.2)

## 2020-04-10 LAB — BASIC METABOLIC PANEL
Anion gap: 9 (ref 5–15)
BUN: 19 mg/dL (ref 8–23)
CO2: 30 mmol/L (ref 22–32)
Calcium: 9.1 mg/dL (ref 8.9–10.3)
Chloride: 104 mmol/L (ref 98–111)
Creatinine, Ser: 0.85 mg/dL (ref 0.44–1.00)
GFR, Estimated: >60 mL/min (ref >60)
Glucose, Bld: 111 mg/dL — ABNORMAL HIGH (ref 70–99)
Potassium: 4.4 mmol/L (ref 3.5–5.1)
Sodium: 143 mmol/L (ref 135–145)

## 2020-04-10 LAB — CBC
HCT: 29.6 % — ABNORMAL LOW (ref 36.0–46.0)
Hemoglobin: 9.5 g/dL — ABNORMAL LOW (ref 12.0–15.0)
MCH: 27.1 pg (ref 26.0–34.0)
MCHC: 32.1 g/dL (ref 30.0–36.0)
MCV: 84.6 fL (ref 80.0–100.0)
Platelets: 227 10*3/uL (ref 150–400)
RBC: 3.5 MIL/uL — ABNORMAL LOW (ref 3.87–5.11)
RDW: 16.7 % — ABNORMAL HIGH (ref 11.5–15.5)
WBC: 6.1 10*3/uL (ref 4.0–10.5)
nRBC: 0 % (ref 0.0–0.2)

## 2020-04-10 LAB — LUPUS ANTICOAGULANT PANEL
DRVVT: 74.7 s — ABNORMAL HIGH (ref 0.0–47.0)
PTT Lupus Anticoagulant: 36 s (ref 0.0–51.9)

## 2020-04-10 LAB — MAGNESIUM: Magnesium: 1.8 mg/dL (ref 1.7–2.4)

## 2020-04-10 LAB — DRVVT MIX: dRVVT Mix: 54.1 s — ABNORMAL HIGH (ref 0.0–40.4)

## 2020-04-10 MED ORDER — PANTOPRAZOLE SODIUM 40 MG PO TBEC
40.0000 mg | DELAYED_RELEASE_TABLET | Freq: Every day | ORAL | Status: DC
  Administered 2020-04-10 – 2020-04-15 (×6): 40 mg via ORAL
  Filled 2020-04-10 (×6): qty 1

## 2020-04-10 NOTE — Consult Note (Signed)
Consultation Note Date: 04/10/2020   Patient Name: [name]  DOB: [birth year]  MRN: [id number]  Age / Sex: 70 y.o., adult  PCP: [name], [name], MD Referring Physician: Sharen Hones, MD  Reason for Consultation: Establishing goals of care  HPI/[name] is a 70 y.o. adult with medical history significant for CVA, hypertension, diabetes mellitus type 2, morbid obesity, chronic pain, paroxysmal atrial fibrillation, previous DVT, OSA on CPAP, presented to the emergency department for chief concerns of primary care provider telling her to come to the emergency department for further evaluation due to new stroke.  Clinical Assessment and Goals of Care: Patient is sitting in bed.  No family at bedside.  She states that she lives at home with her husband and has been married 71 years; she loves to spend time with her family.  She is a retired Child psychotherapist, and states that she hates retirement.  She states that she likes to work outside in her flowers.  [name] tells me that she used to be able to [facility] but is no longer able to do that.  She tells me that she loves to cook, and loves to cook for her neighbors, though her husband is having to do more of the cooking these days.  Patient states that she uses a walker to aid with mobility and is able to do some of her grocery shopping.  She states there are 2 housekeepers that come in to clean and do laundry.  Upon broaching her health conditions, she speaks in depth about her family and their health conditions.  Staff entered to work with patient.  I will follow-up tomorrow   SUMMARY OF RECOMMENDATIONS   Palliative will continue to follow.  Prognosis:   Unable to determine      Primary Diagnoses: Present on Admission: . Stroke ([place]) . Acute embolic stroke ([place]) . AF (paroxysmal atrial fibrillation) ([place]) . Diabetic peripheral  neuropathy associated with type 2 diabetes mellitus ([place]) . Diabetic ulcer of left foot ([place]) . Obstructive sleep apnea of adult . Obesity, Class III, BMI 40-49.9 (morbid obesity) ([place])   I have reviewed the medical record, interviewed the patient and family, and examined the patient. The following aspects are pertinent.  Past Medical History:  Diagnosis Date  . Abnormal antibody titer   . Anxiety and depression   . Arthritis   . Asthma   . Cystocele   . Depression   . Diabetes ([place])   . DVT (deep venous thrombosis) ([facility])    Righ calf  . Dyspnea    11/08/2018 - "more now due to lack of exercise"  . Dysrhythmia    afib  . Edema   . GERD (gastroesophageal reflux disease)   . Heart murmur    Echocardiogram June 2015: Mild MR (possible vegetation seen on TTE, not seen on TEE), normal LV size with moderate concentric LVH. Normal function EF 60-65%. Normal diastolic function. Mild LA dilation.  Marland Kitchen History of IBS   . History of kidney  stones   . History of methicillin resistant staphylococcus aureus (MRSA) 2008  . Hyperlipidemia   . Hypertension   . Hypothyroidism   . Insomnia   . Kidney stone    kidney stones with lithotripsy .  Assumption Kidney- "adrenal glands"  . Neuropathy involving both lower extremities   . Obesity, Class II, BMI 35-39.9, with comorbidity   . Paroxysmal atrial fibrillation ([place]) 2007   In June 2015, Cardiac Event Monitor: Mostly SR/sinus arrhythmia with PVCs that are frequent. Short bursts of A. fib lasting several minutes;; CHA2DS2-VASc Score = 5 (age, Female, PVD, DM, HTN)  . Peripheral vascular disease ([place])   . Sleep apnea    use C-PAP  . Urinary incontinence   . Venous stasis dermatitis of both lower extremities    Social History   Socioeconomic History  . Marital status: Married    Spouse name: Not on file  . Number of children: Not on file  . Years of education: Not on file  . Highest education level: Not on file  Occupational History  . Not  on file  Tobacco Use  . Smoking status: Never Smoker  . Smokeless tobacco: Never Used  Vaping Use  . Vaping Use: Never used  Substance and Sexual Activity  . Alcohol use: No    Alcohol/week: 0.0 standard drinks  . Drug use: Never  . Sexual activity: Yes  Other Topics Concern  . Not on file  Social History Narrative   She is currently married -- for 30+ years. Does not work. Does not smoke or take alcohol. She never smoked. She exercises at least 3 days a week since before her gastric surgery.   Marital status reviewed in history of present illness.   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 Skin cancer Father   . Diabetes Father   . Hypertension Father   . Peripheral vascular disease Father   . Cancer Father   . Cerebral aneurysm Father   . Alcohol abuse Father   . Varicose Veins Mother   . Kidney disease Mother   . Arthritis Mother   . Mental illness Sister   . Cancer Maternal Aunt        breast  . Breast cancer Maternal Aunt   . Arthritis Maternal Grandmother   . Hypertension Maternal Grandmother   . Diabetes Maternal Grandmother   . Arthritis Maternal Grandfather   . Heart disease Maternal Grandfather   . Hypertension Maternal Grandfather   . Arthritis Paternal Grandmother   . Hypertension Paternal Grandmother   . Diabetes Paternal Grandmother   . Arthritis Paternal Grandfather   . Hypertension Paternal Grandfather   . Bladder Cancer Neg Hx   . Kidney cancer Neg Hx    Scheduled Meds: .  stroke: mapping our early stages of recovery book   Does not apply Once  . amLODipine  5 mg Oral Daily  . apixaban  5 mg Oral BID  . vitamin C   250 mg Oral BID  . bacitracin   Topical BID  . buPROPion  150 mg Oral Daily  .  cholecalciferol  1,000 Units Oral BID WC  . DULoxetine  60 mg Oral Daily  . feeding supplement  237 mL Oral Q24H  . fesoterodine  8 mg Oral Daily  . folic acid  1 mg Oral Daily  . losartan  50 mg Oral Daily   And  . hydrochlorothiazide  12.5 mg Oral Daily  . ipratropium-albuterol  3 mL Nebulization BID  . levothyroxine  175 mcg Oral Q0600  . linagliptin  5 mg Oral Daily  . metFORMIN  500 mg Oral Q breakfast  . metoprolol succinate  50 mg Oral Daily  . mometasone-formoterol  2 puff Inhalation BID  . multivitamin with minerals  1 tablet Oral Daily  . oxybutynin  10 mg Oral QHS  . rosuvastatin  20 mg Oral Daily  . sucralfate  1 g Oral TID WC & HS  . thiamine  50 mg Oral BID WC  . cyanocobalamin  1,000 mcg Oral Daily  . [START ON 04/12/2020] Vitamin D (Ergocalciferol)  50,000 Units Oral Weekly   Continuous Infusions: PRN Meds:.acetaminophen **OR** acetaminophen (TYLENOL) oral liquid 160 mg/5 mL **OR** acetaminophen, albuterol, ALPRAZolam, ondansetron, traMADol Medications Prior to Admission:  Prior to Admission medications   Medication Sig Start Date End Date Taking? Authorizing Provider  acetaminophen (TYLENOL) 325 MG tablet Take 2 tablets (650 mg total) by mouth every 6 (six) hours as needed for mild pain or moderate pain (or Fever >/= 101). 09/08/19  Yes Guilford Shi, MD  albuterol (PROVENTIL) (2.5 MG/3ML) 0.083% nebulizer solution Take 2.5 mg by nebulization every 4 (four) hours as needed for wheezing or shortness of breath.   Yes [provider]  ALPRAZolam Duanne Moron) 0.5 MG tablet Take 0.5 mg by mouth at bedtime as needed for anxiety or sleep.   Yes [provider]  amLODipine (NORVASC) 5 MG tablet Take 1 tablet (5 mg total) by mouth daily. 11/28/19 02/26/20 Yes Loel Dubonnet, NP  apixaban (ELIQUIS) 5 MG TABS tablet Take 1 tablet (5 mg total) by mouth 2 (two) times daily. 01/30/20  Yes  Loel Dubonnet, NP  buPROPion (WELLBUTRIN XL) 150 MG 24 hr tablet Take 1 tablet (150 mg total) by mouth daily. 05/02/19  Yes [name], [name], MD  calcium-vitamin D (OSCAL WITH D) 500-200 MG-UNIT tablet Take 1 tablet by mouth 2 (two) times daily with a meal. 02/10/20  Yes Nicole Kindred A, DO  cholecalciferol (VITAMIN D) 25 MCG tablet Take 1 tablet (1,000 Units total) by mouth 2 (two) times daily with a meal. 02/10/20  Yes Nicole Kindred A, DO  clobetasol cream (TEMOVATE) [id number] % Apply 1 application topically 2 (two) times daily as needed (skin irritation).    Yes [provider]  colchicine 0.6 MG tablet Take 0.6 mg by mouth 2 (two) times daily as needed (acute gout flare).    Yes [provider]  diclofenac Sodium (VOLTAREN) 1 % GEL Apply 2 g topically 3 (three) times daily. 02/10/20  Yes Nicole Kindred A, DO  DULoxetine (CYMBALTA) 60 MG capsule Take 1 capsule (60 mg total) by mouth daily. 02/19/20 05/19/20 Yes Milinda Pointer, MD  ergocalciferol (VITAMIN D2) 1.25 MG (50000 UT) capsule Take 50,000 Units by mouth once a week.   Yes [provider]  estradiol (ESTRACE) 0.1 MG/GM vaginal cream Place 1 Applicatorful vaginally every Monday, Wednesday, and Friday. Apply 0.5mg  (pea-sized amount)  just inside the vaginal introitus with a finger-tip on Monday, Wednesday and Friday nights, 02/01/19  Yes McGowan, Hunt Oris, PA-C  fluticasone-salmeterol (ADVAIR HFA) 115-21 MCG/ACT inhaler Inhale 2 puffs into the lungs daily. Rinse mouth thoroughly after use 08/16/19  Yes Tyler Pita, MD  folic acid (FOLVITE) 1 MG tablet Take 1 tablet (1 mg total) by mouth daily. 02/11/20  Yes Nicole Kindred A, DO  HYDROcodone-acetaminophen (NORCO/VICODIN) 5-325 MG tablet Take 1 tablet by mouth every 8 (eight) hours as needed for severe pain. Must last 30 days 03/20/20 04/19/20 Yes Milinda Pointer, MD  levocetirizine (XYZAL) 5 MG tablet Take 0.5 tablets (2.5 mg total) by mouth every  evening. Patient taking differently: Take 5 mg by mouth at bedtime as needed for allergies. 09/07/19  Yes [name], [name], MD  levothyroxine (SYNTHROID) 175 MCG tablet Take 1 tablet (175 mcg total) by mouth daily before breakfast. 02/27/20  Yes [name], [name], MD  losartan-hydrochlorothiazide (HYZAAR) 50-12.5 MG tablet Take 1 tablet by mouth daily.   Yes [provider]  metFORMIN (GLUCOPHAGE) 500 MG tablet Take 1 tablet (500 mg total) by mouth daily with breakfast. With food 02/12/20  Yes [name], [name], MD  metoprolol succinate (TOPROL-XL) 50 MG 24 hr tablet TAKE 1 TABLET BY MOUTH DAILY Patient taking differently: Take 50 mg by mouth daily. 11/09/19  Yes Loel Dubonnet, NP  Misc. Devices (TUB TRANSFER BOARD) MISC 1 Device by Does not apply route as needed. 02/14/20  Yes [name], [name], MD  montelukast (SINGULAIR) 10 MG tablet Take 1 tablet (10 mg total) by mouth at bedtime. 02/27/20  Yes [name], [name], MD  Multiple Vitamin (MULTIVITAMIN WITH MINERALS) TABS tablet Take 1 tablet by mouth daily. 02/11/20  Yes Nicole Kindred A, DO  ondansetron (ZOFRAN) 4 MG tablet Take 1 tablet (4 mg total) by mouth every 8 (eight) hours as needed for nausea or vomiting. 02/14/19  Yes [name], [name], MD  oxybutynin (DITROPAN-XL) 10 MG 24 hr tablet Take 10 mg by mouth at bedtime.   Yes [provider]  pantoprazole (PROTONIX) 40 MG tablet Take 40 mg by mouth daily.    Yes [provider]  pregabalin (LYRICA) 50 MG capsule Take 1 capsule (50 mg total) by mouth 3 (three) times daily. 02/19/20 05/19/20 Yes Milinda Pointer, MD  rosuvastatin (CRESTOR) 20 MG tablet Take 1 tablet (20 mg total) by mouth daily. 01/30/20 07/28/20 Yes Loel Dubonnet, NP  sitaGLIPtin (JANUVIA) 25 MG tablet Take 1 tablet (25 mg total) by mouth daily. 12/26/19  Yes [name], [name], MD  sucralfate (CARAFATE) 1 GM/10ML suspension Take 1 g by mouth 3 (three)  times daily.  12/06/19  Yes [provider]  thiamine 100 MG tablet Take 0.5 tablets (50 mg total) by mouth 2 (two) times daily with a meal. 02/10/20  Yes Nicole Kindred A, DO  TOVIAZ 8 MG TB24 tablet TAKE ONE TABLET BY MOUTH EVERY DAY Patient taking differently: No sig reported 08/07/19  Yes McGowan, [name] A, PA-C  trospium (SANCTURA) 20 MG tablet Take 20 mg by mouth 2 (two) times daily.   Yes [provider]  vitamin B-12 1000 MCG tablet Take 1 tablet (1,000 mcg total) by mouth daily. 02/13/20  Yes Nicole Kindred A, DO  HYDROcodone-acetaminophen (NORCO/VICODIN) 5-325 MG tablet Take 1 tablet by mouth every 8 (eight) hours as needed for severe pain. Must last 30 days 02/19/20 03/20/20  Milinda Pointer, MD  HYDROcodone-acetaminophen (NORCO/VICODIN) 5-325 MG tablet Take 1 tablet by mouth every 8 (eight) hours as needed for severe pain. Must last 30 days 04/19/20 05/19/20  Milinda Pointer, MD  Allergies  Allergen Reactions  . Morphine And Related Other (See Comments) and Nausea Only    Patient becomes very confused Other reaction(s): Other (See Comments), Other (See Comments) Patient becomes very confused Patient becomes very confused   . Penicillins Hives, Shortness Of Breath, Swelling and Other (See Comments)    Facial swelling Has patient had a PCN reaction causing immediate rash, facial/tongue/throat swelling, SOB or lightheadedness with hypotension: Yes Has patient had a PCN reaction causing severe rash involving mucus membranes or skin necrosis: Yes Has patient had a PCN reaction that required hospitalization No Has patient had a PCN reaction occurring within the last 10 years: No If all of the above answers are "NO", then may proceed with Cephalosporin use.  Other reaction(s): Other (See Comments), Other (See Comments) Facial swelling Has patient had a PCN reaction causing immediate rash, facial/tongue/throat swelling, SOB or lightheadedness with hypotension:  Yes Has patient had a PCN reaction causing severe rash involving mucus membranes or skin necrosis: Yes Has patient had a PCN reaction that required hospitalization No Has patient had a PCN reaction occurring within the last 10 years: No If all of the above answers are "NO", then may proceed with Cephalosporin use. Facial swelling Has patient had a PCN reaction causing immediate rash, facial/tongue/throat swelling, SOB or lightheadedness with hypotension: Yes Has patient had a PCN reaction causing severe rash involving mucus membranes or skin necrosis: Yes Has patient had a PCN reaction that required hospitalization No Has patient had a PCN reaction occurring within the last 10 years: No If all of the above answers are "NO", then may proceed with Cephalosporin use. Facial swelling Has patient had a PCN reaction causing immediate rash, facial/tongue/throat swelling, SOB or lightheadedness with hypotension: Yes Has patient had a PCN reaction causing severe rash involving mucus membranes or skin necrosis: Yes Has patient had a PCN reaction that required hospitalization No Has patient had a PCN reaction occurring within the last 10 years: No If all of the above answers are "NO", then may proceed with Cephalosporin use.   . Ambien [Zolpidem]     Hallucination   . Aspirin Hives  . Oxytetracycline Hives    Other reaction(s): Unknown    Review of Systems  Constitutional: Positive for activity change.    Physical Exam Pulmonary:     Effort: Pulmonary effort is normal.  Neurological:     Mental Status: She is alert.     Vital Signs: BP 119/67 (BP Location: Right Arm)   Pulse 61   Temp 97.9 F (36.6 C)   Resp 17   Ht 5\' 5"  (1.651 m)   Wt 111.3 kg   SpO2 95%   BMI 40.83 kg/m  Pain Scale: 0-10 POSS *See Group Information*: 1-Acceptable,Awake and alert Pain Score: 0-No pain   SpO2: SpO2: 95 % O2 Device:SpO2: 95 % O2 Flow Rate: .   IO: Intake/output summary:   Intake/Output  Summary (Last 24 hours) at 04/10/2020 1325 Last data filed at 04/10/2020 1012 Gross per 24 hour  Intake 540 ml  Output 300 ml  Net 240 ml    LBM: Last BM Date: 04/09/20 Baseline Weight: Weight: 111.3 kg Most recent weight: Weight: 111.3 kg       Time In: 12:00 Time Out: 12:30 Time Total: 30 min Greater than 50%  of this time was spent counseling and coordinating care related to the above assessment and plan.  Signed by: Morton Stall, NP   Please contact Palliative Medicine Team phone at  [phone] for questions and concerns.  For individual provider: See Shea Evans

## 2020-04-10 NOTE — Progress Notes (Signed)
PROGRESS NOTE    [name]  ZYS:[id number] DOB: [birth year] DOA: 04/06/2020 PCP: [name], [name], MD   Chief complaint.  Weakness. Brief Narrative:  Dawn Wetherbee [name] a 70 y.o.adultwith medical history significant forCVA, hypertension, diabetes mellitus type 2, morbid obesity, chronic pain, paroxysmal atrial fibrillation, previous DVT, OSA on CPAP, presented to the emergency department for chief concerns of primary care provider telling her to come to the emergency department for further evaluation due to new stroke. Patient states that the she has a left arm and left leg weakness for the last 2 or 3 months. But she is very vague about her symptoms, she states that she might of had worsening weakness 5 days ago. MRI of the brain showed several small infarctsin the right side of brain.  Neurology believes this is due to small vessel disease as most of the lesions changes in the white matter.   Assessment & Plan:   Active Problems:   AF (paroxysmal atrial fibrillation) (HCC)   OSA (obstructive sleep apnea)   Diabetic peripheral neuropathy associated with type 2 diabetes mellitus (HCC)   Obesity, Class III, BMI 40-49.9 (morbid obesity) ([place])   Obstructive sleep apnea of adult   Diabetic ulcer of left foot ([place])   Acute embolic stroke ([place])   Stroke ([place])   Nonsustained ventricular tachycardia ([place])  #1.  White matter small vessel disease. Paroxysmal atrial fibrillation. History of stroke. Patient still has significant weakness, continue physical therapy/Occupational Therapy.  She is a pending for nursing home placement.  2.  Nausea. Patient has been nausea for the last 2 days, not able to eat.  She had a normal bowel movement yesterday.  Will obtain gastric emptying study to rule out diabetic gastroparesis.  Add Protonix to her regimen.  Discontinue hydrochlorothiazide and metformin.  3.  Nonsustained ventricular tachycardia. Patient has normal ejection fraction,  electrolytes are normal.  Continue beta-blocker.  4.  Left second toe wound.  Bilateral foot pain. Appreciate podiatry consult.  5.  Type 2 diabetes. Continue current regimen.  DVT prophylaxis: Eliquis Code Status: Full Family Communication:  Disposition Plan:  .   Status is: Inpatient  Remains inpatient appropriate because:Unsafe d/c plan and Inpatient level of care appropriate due to severity of illness   Dispo: The patient is from: Home              Anticipated d/c is to: SNF              Anticipated d/c date is: 1 day              Patient currently is medically stable to d/c.        I/O last 3 completed shifts: In: 75 [P.O.:720] Out: 2300 [Urine:2300] Total I/O In: 67 [P.O.:60] Out: -      Consultants:   Podiatry  Procedures: None  Antimicrobials: None  Subjective: Patient has been very nauseous for the last 2 days, no vomiting.  She has a poor appetite.  She had normal bowel movements. No short of breath or cough. No fever or chills. No dysuria hematuria.  Objective: Vitals:   04/10/20 0124 04/10/20 0530 04/10/20 0843 04/10/20 1200  BP: (!) 105/58 124/71 109/78 119/67  Pulse: (!) 58 (!) 58 68 61  Resp: 15 18 16 17   Temp: 98 F (36.7 C)  97.9 F (36.6 C) 97.9 F (36.6 C)  TempSrc: Oral     SpO2: 98% 99% 96% 95%  Weight:      Height:  Intake/Output Summary (Last 24 hours) at 04/10/2020 1358 Last data filed at 04/10/2020 1012 Gross per 24 hour  Intake 540 ml  Output 300 ml  Net 240 ml   Filed Weights   04/08/20 1721  Weight: 111.3 kg    Examination:  General exam: Appears calm and comfortable, morbid obesity Respiratory system: Clear to auscultation. Respiratory effort normal. Cardiovascular system: S1 & S2 heard, RRR. No JVD, murmurs, rubs, gallops or clicks. No pedal edema. Gastrointestinal system: Abdomen is nondistended, soft and nontender. No organomegaly or masses felt. Normal bowel sounds heard. Central nervous  system: Alert and oriented. No focal neurological deficits. Extremities: Symmetric  Skin: No rashes, lesions or ulcers Psychiatry: Mood & affect appropriate.     Data Reviewed: I have personally reviewed following labs and imaging studies  CBC: Recent Labs  Lab 04/05/20 1215 04/10/20 0605  WBC 7.1 6.1  NEUTROABS 5.4  --   HGB 10.0* 9.5*  HCT 32.9* 29.6*  MCV 86.8 84.6  PLT 247 353   Basic Metabolic Panel: Recent Labs  Lab 04/05/20 1215 04/06/20 1627 04/10/20 0605  NA 142 140 143  K 4.7 4.8 4.4  CL 104 102 104  CO2 26 27 30   GLUCOSE 102* 107* 111*  BUN 25* 25* 19  CREATININE 1.13* 1.11* 0.85  CALCIUM 9.1 9.1 9.1  MG  --   --  1.8   GFR: Estimated Creatinine Clearance (by C-G formula based on SCr of 0.85 mg/dL) Female: 76.5 mL/min Female: 93.1 mL/min Liver Function Tests: Recent Labs  Lab 04/05/20 1215  AST 18  ALT 14  ALKPHOS 88  BILITOT 0.5  PROT 7.4  ALBUMIN 3.3*   No results for input(s): LIPASE, AMYLASE in the last 168 hours. No results for input(s): AMMONIA in the last 168 hours. Coagulation Profile: Recent Labs  Lab 04/06/20 1639  INR 1.6*   Cardiac Enzymes: No results for input(s): CKTOTAL, CKMB, CKMBINDEX, TROPONINI in the last 168 hours. BNP (last 3 results) No results for input(s): PROBNP in the last 8760 hours. HbA1C: No results for input(s): HGBA1C in the last 72 hours. CBG: Recent Labs  Lab 04/07/20 0751  GLUCAP 102*   Lipid Profile: No results for input(s): CHOL, HDL, LDLCALC, TRIG, CHOLHDL, LDLDIRECT in the last 72 hours. Thyroid Function Tests: No results for input(s): TSH, T4TOTAL, FREET4, T3FREE, THYROIDAB in the last 72 hours. Anemia Panel: No results for input(s): VITAMINB12, FOLATE, FERRITIN, TIBC, IRON, RETICCTPCT in the last 72 hours. Sepsis Labs: No results for input(s): PROCALCITON, LATICACIDVEN in the last 168 hours.  Recent Results (from the past 240 hour(s))  Resp Panel by RT-PCR (Flu A&B, Covid) Nasopharyngeal  Swab     Status: None   Collection Time: 04/06/20  4:27 PM   Specimen: Nasopharyngeal Swab; Nasopharyngeal(NP) swabs in vial transport medium  Result Value Ref Range Status   SARS Coronavirus 2 by RT PCR NEGATIVE NEGATIVE Final    Comment: (NOTE) SARS-CoV-2 target nucleic acids are NOT DETECTED.  The SARS-CoV-2 RNA is generally detectable in upper respiratory specimens during the acute phase of infection. The lowest concentration of SARS-CoV-2 viral copies this assay can detect is 138 copies/mL. A negative result does not preclude SARS-Cov-2 infection and should not be used as the sole basis for treatment or other patient management decisions. A negative result may occur with  improper specimen collection/handling, submission of specimen other than nasopharyngeal swab, presence of viral mutation(s) within the areas targeted by this assay, and inadequate number of viral copies(<138 copies/mL).  A negative result must be combined with clinical observations, patient history, and epidemiological information. The expected result is Negative.  Fact Sheet for Patients:  EntrepreneurPulse.com.au  Fact Sheet for Healthcare Providers:  IncredibleEmployment.be  This test is no t yet approved or cleared by the [place] FDA and  has been authorized for detection and/or diagnosis of SARS-CoV-2 by FDA under an Emergency Use Authorization (EUA). This EUA will remain  in effect (meaning this test can be used) for the duration of the COVID-19 declaration under Section 564(b)(1) of the Act, 21 U.S.C.section 360bbb-3(b)(1), unless the authorization is terminated  or revoked sooner.       Influenza A by PCR NEGATIVE NEGATIVE Final   Influenza B by PCR NEGATIVE NEGATIVE Final    Comment: (NOTE) The Xpert Xpress SARS-CoV-2/FLU/RSV plus assay is intended as an aid in the diagnosis of influenza from Nasopharyngeal swab specimens and should not be used as a sole  basis for treatment. Nasal washings and aspirates are unacceptable for Xpert Xpress SARS-CoV-2/FLU/RSV testing.  Fact Sheet for Patients: EntrepreneurPulse.com.au  Fact Sheet for Healthcare Providers: IncredibleEmployment.be  This test is not yet approved or cleared by the [place] FDA and has been authorized for detection and/or diagnosis of SARS-CoV-2 by FDA under an Emergency Use Authorization (EUA). This EUA will remain in effect (meaning this test can be used) for the duration of the COVID-19 declaration under Section 564(b)(1) of the Act, 21 U.S.C. section 360bbb-3(b)(1), unless the authorization is terminated or revoked.  Performed at [facility], [address] [place], [place] [address]          Radiology Studies: No results found.      Scheduled Meds: .  stroke: mapping our early stages of recovery book   Does not apply Once  . amLODipine  5 mg Oral Daily  . apixaban  5 mg Oral BID  . vitamin C  250 mg Oral BID  . bacitracin   Topical BID  . buPROPion  150 mg Oral Daily  . cholecalciferol  1,000 Units Oral BID WC  . DULoxetine  60 mg Oral Daily  . feeding supplement  237 mL Oral Q24H  . fesoterodine  8 mg Oral Daily  . folic acid  1 mg Oral Daily  . ipratropium-albuterol  3 mL Nebulization BID  . levothyroxine  175 mcg Oral Q0600  . linagliptin  5 mg Oral Daily  . losartan  50 mg Oral Daily  . metoprolol succinate  50 mg Oral Daily  . mometasone-formoterol  2 puff Inhalation BID  . multivitamin with minerals  1 tablet Oral Daily  . oxybutynin  10 mg Oral QHS  . pantoprazole  40 mg Oral Daily  . rosuvastatin  20 mg Oral Daily  . sucralfate  1 g Oral TID WC & HS  . thiamine  50 mg Oral BID WC  . cyanocobalamin  1,000 mcg Oral Daily  . [START ON 04/12/2020] Vitamin D (Ergocalciferol)  50,000 Units Oral Weekly   Continuous Infusions:   LOS: 3 days    Time spent: 27 minutes    Sharen Hones,  MD [facility]   To contact the attending provider between 7A-7P or the covering provider during after hours 7P-7A, please log into the web site [email or web address] and access using universal [place] password for that web site. If you do not have the password, please call the hospital operator.  04/10/2020, 1:58 PM

## 2020-04-10 NOTE — Plan of Care (Signed)
Problem: Education: Goal: Knowledge of General Education information will improve Description: Including pain rating scale, medication(s)/side effects and non-pharmacologic comfort measures 04/10/2020 1312 by Cristela Blue, RN Outcome: Progressing 04/10/2020 1312 by Cristela Blue, RN Outcome: Progressing   Problem: Health Behavior/Discharge Planning: Goal: Ability to manage health-related needs will improve 04/10/2020 1312 by Cristela Blue, RN Outcome: Progressing 04/10/2020 1312 by Cristela Blue, RN Outcome: Progressing   Problem: Clinical Measurements: Goal: Ability to maintain clinical measurements within normal limits will improve 04/10/2020 1312 by Cristela Blue, RN Outcome: Progressing 04/10/2020 1312 by Cristela Blue, RN Outcome: Progressing Goal: Will remain free from infection 04/10/2020 1312 by Cristela Blue, RN Outcome: Progressing 04/10/2020 1312 by Cristela Blue, RN Outcome: Progressing Goal: Diagnostic test results will improve 04/10/2020 1312 by Cristela Blue, RN Outcome: Progressing 04/10/2020 1312 by Cristela Blue, RN Outcome: Progressing Goal: Respiratory complications will improve 04/10/2020 1312 by Cristela Blue, RN Outcome: Progressing 04/10/2020 1312 by Cristela Blue, RN Outcome: Progressing Goal: Cardiovascular complication will be avoided 04/10/2020 1312 by Cristela Blue, RN Outcome: Progressing 04/10/2020 1312 by Cristela Blue, RN Outcome: Progressing   Problem: Activity: Goal: Risk for activity intolerance will decrease 04/10/2020 1312 by Cristela Blue, RN Outcome: Progressing 04/10/2020 1312 by Cristela Blue, RN Outcome: Progressing   Problem: Nutrition: Goal: Adequate nutrition will be maintained 04/10/2020 1312 by Cristela Blue, RN Outcome: Progressing 04/10/2020 1312 by Cristela Blue, RN Outcome: Progressing   Problem: Coping: Goal: Level of anxiety will decrease 04/10/2020 1312 by Cristela Blue, RN Outcome:  Progressing 04/10/2020 1312 by Cristela Blue, RN Outcome: Progressing   Problem: Elimination: Goal: Will not experience complications related to bowel motility 04/10/2020 1312 by Cristela Blue, RN Outcome: Progressing 04/10/2020 1312 by Cristela Blue, RN Outcome: Progressing Goal: Will not experience complications related to urinary retention 04/10/2020 1312 by Cristela Blue, RN Outcome: Progressing 04/10/2020 1312 by Cristela Blue, RN Outcome: Progressing   Problem: Pain Managment: Goal: General experience of comfort will improve 04/10/2020 1312 by Cristela Blue, RN Outcome: Progressing 04/10/2020 1312 by Cristela Blue, RN Outcome: Progressing   Problem: Safety: Goal: Ability to remain free from injury will improve 04/10/2020 1312 by Cristela Blue, RN Outcome: Progressing 04/10/2020 1312 by Cristela Blue, RN Outcome: Progressing   Problem: Skin Integrity: Goal: Risk for impaired skin integrity will decrease 04/10/2020 1312 by Cristela Blue, RN Outcome: Progressing 04/10/2020 1312 by Cristela Blue, RN Outcome: Progressing   Problem: Education: Goal: Knowledge of disease or condition will improve 04/10/2020 1312 by Cristela Blue, RN Outcome: Progressing 04/10/2020 1312 by Cristela Blue, RN Outcome: Progressing Goal: Knowledge of secondary prevention will improve 04/10/2020 1312 by Cristela Blue, RN Outcome: Progressing 04/10/2020 1312 by Cristela Blue, RN Outcome: Progressing Goal: Knowledge of patient specific risk factors addressed and post discharge goals established will improve 04/10/2020 1312 by Cristela Blue, RN Outcome: Progressing 04/10/2020 1312 by Cristela Blue, RN Outcome: Progressing Goal: Individualized Educational Video(s) 04/10/2020 1312 by Cristela Blue, RN Outcome: Progressing 04/10/2020 1312 by Cristela Blue, RN Outcome: Progressing   Problem: Coping: Goal: Will verbalize positive feelings about self 04/10/2020 1312 by Cristela Blue,  RN Outcome: Progressing 04/10/2020 1312 by Cristela Blue, RN Outcome: Progressing Goal: Will identify appropriate support needs 04/10/2020 1312 by Cristela Blue, RN Outcome: Progressing 04/10/2020 1312 by Cristela Blue, RN Outcome: Progressing   Problem: Health Behavior/Discharge Planning: Goal: Ability to manage health-related needs will improve 04/10/2020 1312 by Cristela Blue, RN Outcome: Progressing 04/10/2020 1312 by Cristela Blue, RN Outcome: Progressing   Problem: Self-Care: Goal: Ability to participate in self-care as condition  permits will improve 04/10/2020 1312 by Cristela Blue, RN Outcome: Progressing 04/10/2020 1312 by Cristela Blue, RN Outcome: Progressing Goal: Verbalization of feelings and concerns over difficulty with self-care will improve 04/10/2020 1312 by Cristela Blue, RN Outcome: Progressing 04/10/2020 1312 by Cristela Blue, RN Outcome: Progressing Goal: Ability to communicate needs accurately will improve 04/10/2020 1312 by Cristela Blue, RN Outcome: Progressing 04/10/2020 1312 by Cristela Blue, RN Outcome: Progressing   Problem: Nutrition: Goal: Risk of aspiration will decrease 04/10/2020 1312 by Cristela Blue, RN Outcome: Progressing 04/10/2020 1312 by Cristela Blue, RN Outcome: Progressing Goal: Dietary intake will improve 04/10/2020 1312 by Cristela Blue, RN Outcome: Progressing 04/10/2020 1312 by Cristela Blue, RN Outcome: Progressing   Problem: Intracerebral Hemorrhage Tissue Perfusion: Goal: Complications of Intracerebral Hemorrhage will be minimized 04/10/2020 1312 by Cristela Blue, RN Outcome: Progressing 04/10/2020 1312 by Cristela Blue, RN Outcome: Progressing   Problem: Ischemic Stroke/TIA Tissue Perfusion: Goal: Complications of ischemic stroke/TIA will be minimized 04/10/2020 1312 by Cristela Blue, RN Outcome: Progressing 04/10/2020 1312 by Cristela Blue, RN Outcome: Progressing   Problem: Spontaneous Subarachnoid  Hemorrhage Tissue Perfusion: Goal: Complications of Spontaneous Subarachnoid Hemorrhage will be minimized 04/10/2020 1312 by Cristela Blue, RN Outcome: Progressing 04/10/2020 1312 by Cristela Blue, RN Outcome: Progressing

## 2020-04-10 NOTE — TOC Progression Note (Addendum)
Transition of Care [facility]) - Progression Note    Patient Details  Name: [name] MRN: [id number] Date of Birth: [birth year]  Transition of [facility]) CM/SW Contact  Shelbie Ammons, RN Phone Number: 04/10/2020, 9:47 AM  Clinical Narrative:   RNCM reached out to [facility] with [facility], per Magda Paganini patient will be in her copay days and would need to pay the amount due up front. RNCM communicated with patient's husband [name] and relayed this information. [name] reports that they have recently changed insurance plans and he does not think that patient should have a copay. It is his plan to call the insurance co directly. RNCM reached out to [facility] with [facility] for clarification.  10:20am: RNCM received call back from [place] who reports he has been in contact with [employer] and patient has 20 days at 100% coverage for SNF and he would like to accept at [facility]. RNCM accepted bed in hub and sent message to [place] to notify.     Expected Discharge Plan: Skilled Nursing Facility Barriers to Discharge: Continued Medical Work up,SNF Pending bed offer  Expected Discharge Plan and Services Expected Discharge Plan: [place] In-house Referral: Clinical Social Work   Post Acute Care Choice: [place] Living arrangements for the past 2 months: [place] Determinants of Health (SDOH) Interventions    Readmission Risk Interventions Readmission Risk Prevention Plan 07/30/2018  Transportation Screening Complete  PCP or Specialist Appt within 3-5 Days Complete  HRI or Home Care Consult Complete  Social Work Consult for [place] Planning/Counseling Patient refused  Palliative Care Screening Patient Refused  Medication Review Press photographer) Complete  Some recent data might be hidden

## 2020-04-10 NOTE — Care Management Important Message (Signed)
Important Message  Patient Details  Name: [name] MRN: [id number] Date of Birth: December 21, 1950   Medicare Important Message Given:  Yes     [name] 04/10/2020, 11:09 AM

## 2020-04-11 DIAGNOSIS — E1142 Type 2 diabetes mellitus with diabetic polyneuropathy: Secondary | ICD-10-CM | POA: Diagnosis not present

## 2020-04-11 DIAGNOSIS — E11621 Type 2 diabetes mellitus with foot ulcer: Secondary | ICD-10-CM | POA: Diagnosis not present

## 2020-04-11 DIAGNOSIS — I480 Paroxysmal atrial fibrillation: Secondary | ICD-10-CM | POA: Diagnosis not present

## 2020-04-11 DIAGNOSIS — Z515 Encounter for palliative care: Secondary | ICD-10-CM | POA: Diagnosis not present

## 2020-04-11 DIAGNOSIS — I472 Ventricular tachycardia: Secondary | ICD-10-CM | POA: Diagnosis not present

## 2020-04-11 DIAGNOSIS — L97422 Non-pressure chronic ulcer of left heel and midfoot with fat layer exposed: Secondary | ICD-10-CM | POA: Diagnosis not present

## 2020-04-11 NOTE — Progress Notes (Addendum)
Daily Progress Note   Patient Name: [name]       Date: 04/11/2020 DOB: February 15, 1951  Age: 70 y.o. MRN#: [id number] Attending Physician: Sharen Hones, MD Primary Care Physician: [name], [name], MD Admit Date: 04/06/2020  Reason for Consultation/Follow-up: Establishing goals of care  Subjective: Patient is resting in bed.  She is tearful.  She states that she wants to go home, and that she does not want to go to rehab.  She discusses all the plans that she has made for the past 5 years since retirement, stating that it seems to be one problem after another with her health that prevents she and her husband from being able to do the things that they want such as traveling.    She states at times that she feels that if her current quality of life will not improve, then she is ready to leave this earth when the [place] calls her home.  Discussed quality and quantity of life.  She states that she would want to return to the hospital if needed.  She states that though she does not want to go to SNF, she has found that her physical status is improved after leaving SNF.  We discussed balancing the desire to go home with a desire for maximal independence.    At this juncture her husband enters the room.  Upon introducing myself and my role, he states that he canceled outpatient palliative care as one visit a month is not enough for them.  Patient states she would like to visit with her husband [name].  She states that she does not want to continue our goals of care conversation at this time and would prefer to talk tomorrow.  Length of Stay: 4  Current Medications: Scheduled Meds:  .  stroke: mapping our early stages of recovery book   Does not apply Once  . amLODipine  5 mg Oral Daily  .  apixaban  5 mg Oral BID  . vitamin C  250 mg Oral BID  . bacitracin   Topical BID  . buPROPion  150 mg Oral Daily  . cholecalciferol  1,000 Units Oral BID WC  . DULoxetine  60 mg Oral Daily  . feeding supplement  237 mL Oral Q24H  . fesoterodine  8 mg Oral Daily  .  folic acid  1 mg Oral Daily  . ipratropium-albuterol  3 mL Nebulization BID  . levothyroxine  175 mcg Oral Q0600  . linagliptin  5 mg Oral Daily  . losartan  50 mg Oral Daily  . metoprolol succinate  50 mg Oral Daily  . mometasone-formoterol  2 puff Inhalation BID  . multivitamin with minerals  1 tablet Oral Daily  . oxybutynin  10 mg Oral QHS  . pantoprazole  40 mg Oral Daily  . rosuvastatin  20 mg Oral Daily  . sucralfate  1 g Oral TID WC & HS  . thiamine  50 mg Oral BID WC  . cyanocobalamin  1,000 mcg Oral Daily  . [START ON 04/12/2020] Vitamin D (Ergocalciferol)  50,000 Units Oral Weekly    Continuous Infusions:   PRN Meds: acetaminophen **OR** acetaminophen (TYLENOL) oral liquid 160 mg/5 mL **OR** acetaminophen, albuterol, ALPRAZolam, ondansetron, traMADol  Physical Exam Pulmonary:     Effort: Pulmonary effort is normal.  Neurological:     Mental Status: She is alert.             Vital Signs: BP 123/74 (BP Location: Right Arm)   Pulse 61   Temp 98.4 F (36.9 C) (Oral)   Resp 15   Ht 5\' 5"  (1.651 m)   Wt 111.3 kg   SpO2 98%   BMI 40.83 kg/m  SpO2: SpO2: 98 % O2 Device: O2 Device: Room Air O2 Flow Rate:    Intake/output summary:   Intake/Output Summary (Last 24 hours) at 04/11/2020 1232 Last data filed at 04/11/2020 1014 Gross per 24 hour  Intake 240 ml  Output 651 ml  Net -411 ml   LBM: Last BM Date: 04/09/20 Baseline Weight: Weight: 111.3 kg Most recent weight: Weight: 111.3 kg           Patient Active Problem List   Diagnosis Date Noted  . Nonsustained ventricular tachycardia ([place]) 04/09/2020  . Persistent atrial fibrillation ([place]) 04/04/2020  . Bedbound 03/29/2020  . B12  deficiency 03/11/2020  . Ulcer of left heel and midfoot ([place]) 03/11/2020  . PAD (peripheral artery disease) ([place]) 03/11/2020  . Rotator cuff disorder, left 03/01/2020  . Depression, recurrent ([place]) 03/01/2020  . Cerebrovascular accident (CVA) ([place]) 03/01/2020  . Primary osteoarthritis of left shoulder 02/12/2020  . Folate deficiency 02/08/2020  . Vitamin D deficiency 02/08/2020  . Malnutrition of moderate degree 02/06/2020  . Stroke ([place]) 02/05/2020  . Left arm pain 01/03/2020  . Left hemiparesis ([place]) 12/28/2019  . Hypertension associated with diabetes ([place]) 12/26/2019  . Acute embolic stroke ([place]) 94/85/4627  . Chronic bacteriuria 11/17/2019  . Hyperkalemia 09/14/2019  . Bacteremia due to Gram-positive bacteria 09/08/2019  . Pressure injury of skin 09/04/2019  . Elevated troponin 09/02/2019  . COPD with acute exacerbation ([place]) 09/02/2019  . Acute respiratory failure with hypoxia ([place]) 09/02/2019  . Pharmacologic therapy 08/30/2019  . Recurrent falls 07/10/2019  . Left ankle pain 06/02/2019  . Chronic pain of right ankle 02/14/2019  . Arthritis 02/14/2019  . Nausea and vomiting 02/14/2019  . Meralgia paresthetica of right side 12/05/2018  . Chronic anticoagulation ([place]) 12/05/2018  . Peripheral vascular disease ([place]) 10/18/2018  . Chronic ulcer of right leg ([place]) 10/18/2018  . Fibromyalgia 08/17/2018  . Chronic musculoskeletal pain 08/17/2018  . Tremor of right hand 08/03/2018  . Overactive bladder 08/03/2018  . Physical deconditioning 08/03/2018  . Foot ulcer, left ([place]) 03/09/2018  . Adrenal mass ([place]) 08/02/2017  . Eczema 06/27/2017  .  Diabetic ulcer of left foot ([place]) 06/24/2017  . Mass of both adrenal glands ([place]) 05/16/2017  . Fatty liver 05/13/2017  . Strain of right hip 04/30/2017  . Ischial pain, right 04/30/2017  . Pain of left hip joint 04/22/2017  . Abnormality of gait and mobility 04/22/2017  . Muscle strain of left hip 04/09/2017  . Anemia  11/18/2016  . Asthma 11/18/2016  . Carpal tunnel syndrome 11/18/2016  . Pain in joint involving ankle and foot 11/18/2016  . Obstructive sleep apnea of adult 11/18/2016  . Spinal stenosis of thoracic region 11/18/2016  . Abscess of tendon of left foot 09/21/2016  . Cubital tunnel syndrome on right 08/07/2016  . Chronic pain syndrome 02/25/2016  . Long term current use of opiate analgesic 02/25/2016  . Chronic right shoulder pain 02/25/2016  . Status post reverse total shoulder replacement 10/08/2015  . 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 Obesity, Class III, BMI 40-49.9 (morbid obesity) ([place]) 09/23/2015  . Chronic prescription benzodiazepine use 09/23/2015  . Arthralgia 09/23/2015  . Osteoarthritis involving multiple joints 09/23/2015  . Other shoulder lesions (Right) 08/28/2015  . Cervical radiculitis 08/06/2015  . Fall 05/22/2015  . Opiate use (15 MME/Day) 01/16/2015  . Impingement syndrome of shoulder region 01/07/2015  . Mixed incontinence 11/26/2014  . Atrophic vaginitis 11/26/2014  . Anxiety and depression 09/21/2014  . Bladder cystocele 09/21/2014  . Gastro-esophageal reflux disease without esophagitis 09/21/2014  . DM (diabetes mellitus) type II, controlled, with peripheral vascular disorder ([place]) 08/31/2014  . Diabetic peripheral neuropathy associated with type 2 diabetes mellitus ([place]) 08/31/2014  . Asthma, well controlled 08/31/2014  . Hypothyroidism, unspecified 08/31/2014  . Peripheral vascular disease due to secondary diabetes mellitus ([place]) 12/11/2013  . OSA (obstructive sleep apnea) 10/19/2013  . AF (paroxysmal atrial fibrillation) ([place]) 07/27/2013  . Essential hypertension 07/27/2013  . Chronic atrial fibrillation 08/10/2012    Palliative Care Assessment & Plan   Recommendations/Plan:  Continue current care.    Code Status:    Code Status Orders  (From admission, onward)         Start     Ordered   04/06/20 1711   Full code  Continuous        04/06/20 1712        Code Status History    Date Active Date Inactive Code Status Order ID Comments User Context   02/06/2020 0129 02/10/2020 2154 Full Code [id number]  Toy Baker, MD Inpatient   12/23/2019 0126 12/28/2019 0036 Full Code [id number]  Mansy, Arvella Merles, MD ED   09/02/2019 1303 09/09/2019 0037 Full Code [id number]  Collier Bullock, MD ED   07/29/2018 0055 07/30/2018 1517 Full Code [id number]  Mayer Camel, NP ED   04/04/2018 1010 04/04/2018 1526 Full Code [id number]  [name], MD Inpatient   09/25/2016 0916 09/26/2016 2106 Full Code [id number] Full code with ACLS Protocol WITH NO CHEST COMPRESSIONS. Ventricular Assist Device in place. [name], [facility] Inpatient   09/25/2016 9563 09/25/2016 0916 Full Code [id number] Full code with ACLS Protocol WITH NO CHEST COMPRESSIONS. Ventricular Assist Device in place. [name], [facility] Inpatient   09/25/2016 0841 09/25/2016 0841 Partial Code [id number] Ventricular Assist Device in place.  No Chest Compressions. [name], [facility] Inpatient   09/05/2016 1628 09/09/2016 2215 Full Code [id number]  Quintella Baton, MD ED   08/06/2016 2021 08/07/2016 1727 Full Code [id number]  Poggi, Marshall Cork, MD Inpatient   08/06/2016 1403 08/06/2016 2021 Full Code [id number]  Poggi, Jenny Reichmann  J, MD Inpatient   10/08/2015 1206 10/13/2015 1734 Full Code [id number]  Poggi, Marshall Cork, MD Inpatient   Advance Care Planning Activity    Advance Directive Documentation   Flowsheet Row Most Recent Value  Type of Advance Directive Living will  Pre-existing out of facility DNR order (yellow form or pink MOST form) -  "MOST" Form in Place? -       Prognosis:   Unable to determine   Thank you for allowing the Palliative Medicine Team to assist in the care of this patient.   Total Time 35 min Prolonged Time Billed  no       Greater than 50%  of this time was spent counseling and coordinating care related to the above assessment and plan.  Asencion Gowda,  NP  Please contact Palliative Medicine Team phone at [phone] for questions and concerns.

## 2020-04-11 NOTE — Progress Notes (Signed)
Physical Therapy Treatment Patient Details Name: [name] MRN: [id number] DOB: [birth year] Today's Date: 04/11/2020    History of Present Illness Pt is a 70 y/o F admitted on 04/06/20 after presenting to ED for further evaluation of a new stroke by guidance of her PCP. MRI revealed recurrent stroke/acute punctate focus of restricted diffusion in the right external capsule region. PMH: CVA (October & November 2021), HTN, DM2, morbid obesity, chronic pain, paroxysmal a-fib, DVT, OSA on CPAP, raynaud's phenomenon, anxiety, depression, asthma, cystocele, dyspnea, GERD, heart murmur, IBS, HLD, BLE neuropathy, obesity, PVD, urinary incontinence    PT Comments    Patient received in bed, she is agreeable to PT session. She wants to try to stand. Patient is min/mod assist with bed mobility to assist in raising trunk to seated position. Once seated she demonstrates good sitting balance. Patient is unable to shift weight to B LEs due to foot pain bilaterally. She attempted standing with +2 max assist but is unable to clear bottom from bed at all. Patient will continue to benefit from skilled PT while here to improve functional independence and strength as her pain allows.       Follow Up Recommendations  SNF;Supervision/Assistance - 24 hour     Equipment Recommendations  None recommended by PT;Other (comment) (TBD)    Recommendations for Other Services       Precautions / Restrictions Precautions Precautions: Fall Precaution Comments: per last admission, recommendation to minimize weight bearing through B heels (pt used personal wedge shoes from home) Restrictions Other Position/Activity Restrictions: podiatrist would like gastroc/soleus muscle stretching, thinking foot pain is plantar fascitis.    Mobility  Bed Mobility Overal bed mobility: Needs Assistance Bed Mobility: Supine to Sit;Sit to Supine     Supine to sit: Min assist Sit to supine: Supervision   General bed mobility  comments: patient needs min/mod assist to raise trunk to seated position , increased time, use of bed rails  Transfers                 General transfer comment: unable. Patient attempted to stand from edge of bed with +2 assist. She is unable to raise bottom from bed at all. Patient also attempted just shifting weight to feet in sitting and does not tolerate.  Ambulation/Gait             General Gait Details: unable   Stairs             Wheelchair Mobility    Modified Rankin (Stroke Patients Only)       Balance Overall balance assessment: Needs assistance Sitting-balance support: Feet supported;Single extremity supported Sitting balance-Leahy Scale: Good Sitting balance - Comments: supervision only. Good sitting balance.     Standing balance-Leahy Scale: Zero                              Cognition Arousal/Alertness: Awake/alert Behavior During Therapy: WFL for tasks assessed/performed Overall Cognitive Status: Within Functional Limits for tasks assessed                                 General Comments: Anxious re: mobility & pain      Exercises Other Exercises Other Exercises: B LE exercises: Gentle stretching of B heel cords, patient has low tolerance for this due to pain. seated LAQ, hip abd/add independently.    General Comments  Pertinent Vitals/Pain Pain Assessment: Faces Faces Pain Scale: Hurts whole lot Pain Location: B feet, L arm Pain Descriptors / Indicators: Grimacing;Guarding;Discomfort;Sore Pain Intervention(s): Monitored during session;Limited activity within patient's tolerance;Repositioned    Home Living                      Prior Function            PT Goals (current goals can now be found in the care plan section) Acute Rehab PT Goals Patient Stated Goal: to walk again PT Goal Formulation: With patient Time For Goal Achievement: 04/21/20 Potential to Achieve Goals:  Fair Progress towards PT goals: Not progressing toward goals - comment (pain limited in B feet and left arm)    Frequency    Min 2X/week      PT Plan Current plan remains appropriate    Co-evaluation              AM-PAC PT "6 Clicks" Mobility   Outcome Measure  Help needed turning from your back to your side while in a flat bed without using bedrails?: A Lot Help needed moving from lying on your back to sitting on the side of a flat bed without using bedrails?: A Lot Help needed moving to and from a bed to a chair (including a wheelchair)?: Total Help needed standing up from a chair using your arms (e.g., wheelchair or bedside chair)?: Total Help needed to walk in hospital room?: Total Help needed climbing 3-5 steps with a railing? : Total 6 Click Score: 8    End of Session Equipment Utilized During Treatment: Gait belt Activity Tolerance: Patient limited by pain Patient left: in bed;with call bell/phone within reach;with bed alarm set Nurse Communication: Mobility status PT Visit Diagnosis: Other abnormalities of gait and mobility (R26.89);Difficulty in walking, not elsewhere classified (R26.2);Pain Pain - Right/Left:  (B feet and left arm) Pain - part of body: Arm;Ankle and joints of foot     Time: 7903-8333 PT Time Calculation (min) (ACUTE ONLY): 31 min  Charges:  $Gait Training: 8-22 mins $Therapeutic Exercise: 8-22 mins $Therapeutic Activity: 8-22 mins                     [employer], PT, GCS 04/11/20,12:08 PM

## 2020-04-11 NOTE — Progress Notes (Signed)
PROGRESS NOTE    [name]  YDX:[id number] DOB: [birth year] DOA: 04/06/2020 PCP: [name], [name], MD   Chief complaint.  Weakness. Brief Narrative:  Dawn Styles Robertsis a 70 y.o.adultwith medical history significant forCVA, hypertension, diabetes mellitus type 2, morbid obesity, chronic pain, paroxysmal atrial fibrillation, previous DVT, OSA on CPAP, presented to the emergency department for chief concerns of primary care provider telling her to come to the emergency department for further evaluation due to new stroke. Patient states that the she has a left arm and left leg weakness for the last 2 or 3 months. But she is very vague about her symptoms, she states that she might of had worsening weakness 5 days ago. MRI of the brain showed several small infarctsin the right side of brain.  Neurology believes this is due to small vessel disease as most of the lesions changes in the white matter.   Assessment & Plan:   Active Problems:   AF (paroxysmal atrial fibrillation) (HCC)   OSA (obstructive sleep apnea)   Diabetic peripheral neuropathy associated with type 2 diabetes mellitus (HCC)   Obesity, Class III, BMI 40-49.9 (morbid obesity) ([place])   Obstructive sleep apnea of adult   Diabetic ulcer of left foot ([place])   Acute embolic stroke ([place])   Stroke ([place])   Nonsustained ventricular tachycardia ([place])  #1.  White matter small vessel disease. Paroxysmal atrial fibrillation. History of stroke. Patient has been evaluated by neurology, initially suspect stroke, but neurology has ruled that out.  Condition more consistent with a small vessel disease on MRI. She has been seen by physical therapy/Occupational Therapy.  She is on Eliquis for anticoagulation. She is a pending for nursing home placement.  2.  Nausea. Condition improved after giving Protonix.  Also discontinue hydrochlorothiazide and metformin.  No need for gastric emptying study.  3.  Nonsustained ventricular  tachycardia. Patient had 1 episode of 8 beats.  No additional episodes. Continue beta-blocker.  Echocardiogram showed normal ejection fraction.  4.  Left second toe wound.  Bilateral foot pain. No need for surgery per podiatry.  Bilateral foot pain improving.  5.  Type 2 diabetes.  Continue current regimen.    DVT prophylaxis: Eliquis Code Status: Full Family Communication: Husband at bedside Disposition Plan:  .   Status is: Inpatient  Remains inpatient appropriate because:Unsafe d/c plan   Dispo: The patient is from: Home              Anticipated d/c is to: SNF              Anticipated d/c date is: 1 day              Patient currently is medically stable to d/c.        I/O last 3 completed shifts: In: 30 [P.O.:60] Out: 73 [Urine:950; Stool:1] Total I/O In: 3 [P.O.:310] Out: -      Consultants:   Neurology  Procedures: None  Antimicrobials: None  Subjective: Patient doing well today.  She no longer has any nausea vomiting.  She had a bowel movement. Not short of breath or cough. No fever or chills. No dysuria or hematuria.   Objective: Vitals:   04/11/20 0010 04/11/20 0422 04/11/20 0759 04/11/20 1215  BP: 121/70 124/60 122/76 123/74  Pulse: 62 81 (!) 58 61  Resp: 17 16 15 15   Temp: (!) 97.5 F (36.4 C) 97.6 F (36.4 C) 97.7 F (36.5 C) 98.4 F (36.9 C)  TempSrc: Oral  Oral  SpO2: 95% 94% 98% 98%  Weight:      Height:        Intake/Output Summary (Last 24 hours) at 04/11/2020 1443 Last data filed at 04/11/2020 1409 Gross per 24 hour  Intake 310 ml  Output 651 ml  Net -341 ml   Filed Weights   04/08/20 1721  Weight: 111.3 kg    Examination:  General exam: Appears calm and comfortable, morbid obese Respiratory system: Clear to auscultation. Respiratory effort normal. Cardiovascular system: S1 & S2 heard, RRR. No JVD, murmurs, rubs, gallops or clicks. No pedal edema. Gastrointestinal system: Abdomen is nondistended, soft and  nontender. No organomegaly or masses felt. Normal bowel sounds heard. Central nervous system: Alert and oriented. No focal neurological deficits. Extremities: Symmetric  Skin: No rashes, lesions or ulcers Psychiatry:Mood & affect appropriate.     Data Reviewed: I have personally reviewed following labs and imaging studies  CBC: Recent Labs  Lab 04/05/20 1215 04/10/20 0605  WBC 7.1 6.1  NEUTROABS 5.4  --   HGB 10.0* 9.5*  HCT 32.9* 29.6*  MCV 86.8 84.6  PLT 247 144   Basic Metabolic Panel: Recent Labs  Lab 04/05/20 1215 04/06/20 1627 04/10/20 0605  NA 142 140 143  K 4.7 4.8 4.4  CL 104 102 104  CO2 26 27 30   GLUCOSE 102* 107* 111*  BUN 25* 25* 19  CREATININE 1.13* 1.11* 0.85  CALCIUM 9.1 9.1 9.1  MG  --   --  1.8   GFR: Estimated Creatinine Clearance (by C-G formula based on SCr of 0.85 mg/dL) Female: 76.5 mL/min Female: 93.1 mL/min Liver Function Tests: Recent Labs  Lab 04/05/20 1215  AST 18  ALT 14  ALKPHOS 88  BILITOT 0.5  PROT 7.4  ALBUMIN 3.3*   No results for input(s): LIPASE, AMYLASE in the last 168 hours. No results for input(s): AMMONIA in the last 168 hours. Coagulation Profile: Recent Labs  Lab 04/06/20 1639  INR 1.6*   Cardiac Enzymes: No results for input(s): CKTOTAL, CKMB, CKMBINDEX, TROPONINI in the last 168 hours. BNP (last 3 results) No results for input(s): PROBNP in the last 8760 hours. HbA1C: No results for input(s): HGBA1C in the last 72 hours. CBG: Recent Labs  Lab 04/07/20 0751  GLUCAP 102*   Lipid Profile: No results for input(s): CHOL, HDL, LDLCALC, TRIG, CHOLHDL, LDLDIRECT in the last 72 hours. Thyroid Function Tests: No results for input(s): TSH, T4TOTAL, FREET4, T3FREE, THYROIDAB in the last 72 hours. Anemia Panel: No results for input(s): VITAMINB12, FOLATE, FERRITIN, TIBC, IRON, RETICCTPCT in the last 72 hours. Sepsis Labs: No results for input(s): PROCALCITON, LATICACIDVEN in the last 168 hours.  Recent  Results (from the past 240 hour(s))  Resp Panel by RT-PCR (Flu A&B, Covid) Nasopharyngeal Swab     Status: None   Collection Time: 04/06/20  4:27 PM   Specimen: Nasopharyngeal Swab; Nasopharyngeal(NP) swabs in vial transport medium  Result Value Ref Range Status   SARS Coronavirus 2 by RT PCR NEGATIVE NEGATIVE Final    Comment: (NOTE) SARS-CoV-2 target nucleic acids are NOT DETECTED.  The SARS-CoV-2 RNA is generally detectable in upper respiratory specimens during the acute phase of infection. The lowest concentration of SARS-CoV-2 viral copies this assay can detect is 138 copies/mL. A negative result does not preclude SARS-Cov-2 infection and should not be used as the sole basis for treatment or other patient management decisions. A negative result may occur with  improper specimen collection/handling, submission of specimen other  than nasopharyngeal swab, presence of viral mutation(s) within the areas targeted by this assay, and inadequate number of viral copies(<138 copies/mL). A negative result must be combined with clinical observations, patient history, and epidemiological information. The expected result is Negative.  Fact Sheet for Patients:  EntrepreneurPulse.com.au  Fact Sheet for Healthcare Providers:  IncredibleEmployment.be  This test is no t yet approved or cleared by the [place] FDA and  has been authorized for detection and/or diagnosis of SARS-CoV-2 by FDA under an Emergency Use Authorization (EUA). This EUA will remain  in effect (meaning this test can be used) for the duration of the COVID-19 declaration under Section 564(b)(1) of the Act, 21 U.S.C.section 360bbb-3(b)(1), unless the authorization is terminated  or revoked sooner.       Influenza A by PCR NEGATIVE NEGATIVE Final   Influenza B by PCR NEGATIVE NEGATIVE Final    Comment: (NOTE) The Xpert Xpress SARS-CoV-2/FLU/RSV plus assay is intended as an aid in the  diagnosis of influenza from Nasopharyngeal swab specimens and should not be used as a sole basis for treatment. Nasal washings and aspirates are unacceptable for Xpert Xpress SARS-CoV-2/FLU/RSV testing.  Fact Sheet for Patients: EntrepreneurPulse.com.au  Fact Sheet for Healthcare Providers: IncredibleEmployment.be  This test is not yet approved or cleared by the [place] FDA and has been authorized for detection and/or diagnosis of SARS-CoV-2 by FDA under an Emergency Use Authorization (EUA). This EUA will remain in effect (meaning this test can be used) for the duration of the COVID-19 declaration under Section 564(b)(1) of the Act, 21 U.S.C. section 360bbb-3(b)(1), unless the authorization is terminated or revoked.  Performed at [facility], [address], [place], [place] [address]          Radiology Studies: No results found.      Scheduled Meds: .  stroke: mapping our early stages of recovery book   Does not apply Once  . amLODipine  5 mg Oral Daily  . apixaban  5 mg Oral BID  . vitamin C  250 mg Oral BID  . bacitracin   Topical BID  . buPROPion  150 mg Oral Daily  . cholecalciferol  1,000 Units Oral BID WC  . DULoxetine  60 mg Oral Daily  . feeding supplement  237 mL Oral Q24H  . fesoterodine  8 mg Oral Daily  . folic acid  1 mg Oral Daily  . ipratropium-albuterol  3 mL Nebulization BID  . levothyroxine  175 mcg Oral Q0600  . linagliptin  5 mg Oral Daily  . losartan  50 mg Oral Daily  . metoprolol succinate  50 mg Oral Daily  . mometasone-formoterol  2 puff Inhalation BID  . multivitamin with minerals  1 tablet Oral Daily  . oxybutynin  10 mg Oral QHS  . pantoprazole  40 mg Oral Daily  . rosuvastatin  20 mg Oral Daily  . sucralfate  1 g Oral TID WC & HS  . thiamine  50 mg Oral BID WC  . cyanocobalamin  1,000 mcg Oral Daily  . [START ON 04/12/2020] Vitamin D (Ergocalciferol)  50,000 Units Oral Weekly    Continuous Infusions:   LOS: 4 days    Time spent: 28 minutes    Sharen Hones, MD [facility]   To contact the attending provider between 7A-7P or the covering provider during after hours 7P-7A, please log into the web site [email or web address] and access using universal [place] password for that web site. If you do not have  the password, please call the hospital operator.  04/11/2020, 2:43 PM

## 2020-04-12 DIAGNOSIS — Z515 Encounter for palliative care: Secondary | ICD-10-CM | POA: Diagnosis not present

## 2020-04-12 DIAGNOSIS — Z7189 Other specified counseling: Secondary | ICD-10-CM

## 2020-04-12 DIAGNOSIS — I480 Paroxysmal atrial fibrillation: Secondary | ICD-10-CM | POA: Diagnosis not present

## 2020-04-12 MED ORDER — POLYVINYL ALCOHOL 1.4 % OP SOLN
1.0000 [drp] | OPHTHALMIC | Status: DC | PRN
  Filled 2020-04-12: qty 15

## 2020-04-12 NOTE — Progress Notes (Signed)
Occupational Therapy Treatment Patient Details Name: [name] MRN: [id number] DOB: Nov 02, 1950 Today's Date: 04/12/2020    History of present illness Pt is a 70 y/o F admitted on 04/06/20 after presenting to ED for further evaluation of a new stroke by guidance of her PCP. MRI revealed recurrent stroke/acute punctate focus of restricted diffusion in the right external capsule region. PMH: CVA (October & November 2021), HTN, DM2, morbid obesity, chronic pain, paroxysmal a-fib, DVT, OSA on CPAP, raynaud's phenomenon, anxiety, depression, asthma, cystocele, dyspnea, GERD, heart murmur, IBS, HLD, BLE neuropathy, obesity, PVD, urinary incontinence   OT comments  Ms Dlugosz was seen for OT/PT co-treatment on this date. Upon arrival to room pt reclined in bed and agreeable to therapy. Pt states her husband has not been able to bring shoes 2/2 his recent fall. Pt continues to be limited by her B foot pain - per podiatry 1/18 note pt may be WBAT, pt verbalized understanding. MAX A x2 perihygiene at bed level. MAX A dob B socks at bed level. CGA don/doff gown seated EOB. Pt making progress toward goals. Pt continues to benefit from skilled OT services to maximize return to PLOF and minimize risk of future falls, injury, caregiver burden, and readmission. Will continue to follow POC. Discharge recommendation remains appropriate.    Follow Up Recommendations  SNF    Equipment Recommendations  None recommended by OT    Recommendations for Other Services      Precautions / Restrictions Precautions Precautions: Fall Restrictions Weight Bearing Restrictions: Yes LLE Weight Bearing: Weight bearing as tolerated Other Position/Activity Restrictions: per podiatry WBAT and rec gastroc/soleus stretching       Mobility Bed Mobility Overal bed mobility: Needs Assistance Bed Mobility: Supine to Sit;Sit to Supine     Supine to sit: Min assist;+2 for physical assistance Sit to supine: Mod assist       Transfers Overall transfer level: Needs assistance Equipment used: Rolling walker (2 wheeled) Transfers: Sit to/from Stand Sit to Stand: Total assist;+2 physical assistance;From elevated surface         General transfer comment: pt unable to lean weight forward for WBing on B heels in sitting. Clears rear c TOTAL A X2 + RW + sheet under rear; however unable to achieve fully upright posture as pt is extremly anxious and pain limited    Balance Overall balance assessment: Needs assistance Sitting-balance support: Feet supported;Single extremity supported Sitting balance-Leahy Scale: Good     Standing balance support: Bilateral upper extremity supported;During functional activity Standing balance-Leahy Scale: Zero Standing balance comment: total assist +2 with BUE support on RW                           ADL either performed or assessed with clinical judgement   ADL Overall ADL's : Needs assistance/impaired                                       General ADL Comments: MAX A x2 perihygiene at bed level. MAX A dob B socks at bed level. CGA don/doff gown seated EOB               Cognition Arousal/Alertness: Awake/alert Behavior During Therapy: Anxious Overall Cognitive Status: Within Functional Limits for tasks assessed  Exercises Exercises: Other exercises Other Exercises Other Exercises: Pt educated re: OT role, DME recs, d/c recs, falls prevention, pain mgmt Other Exercises: LBD, sup<>sit, sit<>stand, sitting/standing balance/tolerance, UBD, toileting           Pertinent Vitals/ Pain       Pain Assessment: Faces Faces Pain Scale: Hurts even more Pain Location: B feet, L arm Pain Descriptors / Indicators: Grimacing;Guarding;Discomfort;Sore Pain Intervention(s): Limited activity within patient's tolerance;Repositioned         Frequency  Min 1X/week        Progress Toward  Goals  OT Goals(current goals can now be found in the care plan section)  Progress towards OT goals: Progressing toward goals  Acute Rehab OT Goals Patient Stated Goal: to walk again OT Goal Formulation: With patient Time For Goal Achievement: 04/21/20 Potential to Achieve Goals: Fair ADL Goals Pt Will Perform Grooming: sitting;with supervision;with set-up Pt Will Perform Lower Body Dressing: with min assist;sitting/lateral leans Pt Will Transfer to Toilet: with max assist;squat pivot transfer;bedside commode;with +2 assist  Plan Discharge plan remains appropriate;Frequency remains appropriate    Co-evaluation    PT/OT/SLP Co-Evaluation/Treatment: Yes Reason for Co-Treatment: For patient/therapist safety;To address functional/ADL transfers   OT goals addressed during session: ADL's and self-care      AM-PAC OT "6 Clicks" Daily Activity     Outcome Measure   Help from another person eating meals?: A Little Help from another person taking care of personal grooming?: A Little Help from another person toileting, which includes using toliet, bedpan, or urinal?: A Lot Help from another person bathing (including washing, rinsing, drying)?: A Lot Help from another person to put on and taking off regular upper body clothing?: A Little Help from another person to put on and taking off regular lower body clothing?: A Lot 6 Click Score: 15    End of Session Equipment Utilized During Treatment: Rolling walker  OT Visit Diagnosis: Other abnormalities of gait and mobility (R26.89);Muscle weakness (generalized) (M62.81)   Activity Tolerance Patient limited by pain   Patient Left in bed;with call bell/phone within reach;with nursing/sitter in room;Other (comment) (PT and RN in room end of session)   Nurse Communication Mobility status        Time: 2637-8588 OT Time Calculation (min): 33 min  Charges: OT General Charges $OT Visit: 1 Visit OT Treatments $Self Care/Home  Management : 8-22 mins  Dessie Coma, M.S. OTR/L  04/12/20, 10:20 AM  ascom [phone]

## 2020-04-12 NOTE — Progress Notes (Signed)
Daily Progress Note   Patient Name: [name]       Date: 04/12/2020 DOB: 1951/03/22  Age: 70 y.o. MRN#: [id number] Attending Physician: Shawna Clamp, MD Primary Care Physician: [name], [name], MD Admit Date: 04/06/2020  Reason for Consultation/Follow-up: Establishing goals of care  Subjective: Patient states she is discharging to [facility] on Monday as her husband cannot provide the help she needs at home at this time. She discusses feeling frustrated about the neuropathic pain in her feet, and that she has been followed by a pain clinic for years.   We discussed her diagnoses, prognosis, GOC, EOL wishes disposition and options.  A detailed discussion was had today regarding advanced directives.  Concepts specific to code status, artifical feeding and hydration, IV antibiotics and rehospitalization were discussed.  The difference between an aggressive medical intervention path and a comfort care path was discussed.  Values and goals of care important to patient and family were attempted to be elicited.  Discussed limitations of medical interventions to prolong quality of life in some situations and discussed the concept of human mortality.  She states she has been considering her wishes on decisions such as CPR, ventilator support, feeding tube, and other care moving forward, but states she will need to speak with each individual in her family before making solid decisions. Recommend palliative to follow outpatient to continue these conversations.     Current Medications: Scheduled Meds:    stroke: mapping our early stages of recovery book   Does not apply Once   amLODipine  5 mg Oral Daily   apixaban  5 mg Oral BID   vitamin C  250 mg Oral BID   buPROPion   150 mg Oral Daily   cholecalciferol  1,000 Units Oral BID WC   DULoxetine  60 mg Oral Daily   feeding supplement  237 mL Oral Q24H   fesoterodine  8 mg Oral Daily   folic acid  1 mg Oral Daily   ipratropium-albuterol  3 mL Nebulization BID   levothyroxine  175 mcg Oral Q0600   linagliptin  5 mg Oral Daily   losartan  50 mg Oral Daily   metoprolol succinate  50 mg Oral Daily   mometasone-formoterol  2 puff Inhalation BID   multivitamin with minerals  1 tablet Oral  Daily   oxybutynin  10 mg Oral QHS   pantoprazole  40 mg Oral Daily   rosuvastatin  20 mg Oral Daily   sucralfate  1 g Oral TID WC & HS   thiamine  50 mg Oral BID WC   cyanocobalamin  1,000 mcg Oral Daily   Vitamin D (Ergocalciferol)  50,000 Units Oral Weekly    Continuous Infusions:   PRN Meds: acetaminophen **OR** acetaminophen (TYLENOL) oral liquid 160 mg/5 mL **OR** acetaminophen, albuterol, ALPRAZolam, ondansetron, traMADol  Physical Exam Pulmonary:     Effort: Pulmonary effort is normal.  Neurological:     Mental Status: She is alert.             Vital Signs: BP 119/61 (BP Location: Right Arm)    Pulse 62    Temp 98.8 F (37.1 C) (Oral)    Resp 18    Ht 5\' 5"  (1.651 m)    Wt 111.3 kg    SpO2 93%    BMI 40.83 kg/m  SpO2: SpO2: 93 % O2 Device: O2 Device: Room Air O2 Flow Rate:    Intake/output summary:   Intake/Output Summary (Last 24 hours) at 04/12/2020 1017 Last data filed at 04/12/2020 0354 Gross per 24 hour  Intake 70 ml  Output 1650 ml  Net -1580 ml   LBM: Last BM Date: 04/11/20 Baseline Weight: Weight: 111.3 kg Most recent weight: Weight: 111.3 kg         Flowsheet Rows   Flowsheet Row Most Recent Value  Intake Tab   Referral Department Hospitalist  Unit at Time of Referral ER  Palliative Care Primary Diagnosis Neurology  Date Notified 04/07/20  Palliative Care Type Return patient Palliative Care  Reason for referral Clarify Goals of Care  Date of Admission  04/06/20  Date first seen by Palliative Care 04/10/20  # of days Palliative referral response time 3 Day(s)  # of days IP prior to Palliative referral 1  Clinical Assessment   Psychosocial & Spiritual Assessment   Palliative Care Outcomes       Patient Active Problem List   Diagnosis Date Noted   Nonsustained ventricular tachycardia ([place]) 04/09/2020   Persistent atrial fibrillation ([place]) 04/04/2020   Bedbound 03/29/2020   B12 deficiency 03/11/2020   Ulcer of left heel and midfoot ([place]) 03/11/2020   PAD (peripheral artery disease) ([place]) 03/11/2020   Rotator cuff disorder, left 03/01/2020   Depression, recurrent ([place]) 03/01/2020   Cerebrovascular accident (CVA) ([place]) 03/01/2020   Primary osteoarthritis of left shoulder 02/12/2020   Folate deficiency 02/08/2020   Vitamin D deficiency 02/08/2020   Malnutrition of moderate degree 02/06/2020   Stroke ([place]) 02/05/2020   Left arm pain 01/03/2020   Left hemiparesis ([place]) 12/28/2019   Hypertension associated with diabetes ([place]) 09/32/3557   Acute embolic stroke ([place]) 32/20/2542   Chronic bacteriuria 11/17/2019   Hyperkalemia 09/14/2019   Bacteremia due to Gram-positive bacteria 09/08/2019   Pressure injury of skin 09/04/2019   Elevated troponin 09/02/2019   COPD with acute exacerbation ([place]) 09/02/2019   Acute respiratory failure with hypoxia ([place]) 09/02/2019   Pharmacologic therapy 08/30/2019   Recurrent falls 07/10/2019   Left ankle pain 06/02/2019   Chronic pain of right ankle 02/14/2019   Arthritis 02/14/2019   Nausea and vomiting 02/14/2019   Meralgia paresthetica of right side 12/05/2018   Chronic anticoagulation ([place]) 12/05/2018   Peripheral vascular disease ([place]) 10/18/2018   Chronic ulcer of right leg ([place]) 10/18/2018   Fibromyalgia 08/17/2018  Chronic musculoskeletal pain 08/17/2018   Tremor of right hand 08/03/2018   Overactive bladder 08/03/2018   Physical  deconditioning 08/03/2018   Foot ulcer, left ([place]) 03/09/2018   Adrenal mass ([place]) 08/02/2017   Eczema 06/27/2017   Diabetic ulcer of left foot ([place]) 06/24/2017   Mass of both adrenal glands ([place]) 05/16/2017   Fatty liver 05/13/2017   Strain of right hip 04/30/2017   Ischial pain, right 04/30/2017   Pain of left hip joint 04/22/2017   Abnormality of gait and mobility 04/22/2017   Muscle strain of left hip 04/09/2017   Anemia 11/18/2016   Asthma 11/18/2016   Carpal tunnel syndrome 11/18/2016   Pain in joint involving ankle and foot 11/18/2016   Obstructive sleep apnea of adult 11/18/2016   Spinal stenosis of thoracic region 11/18/2016   Abscess of tendon of left foot 09/21/2016   Cubital tunnel syndrome on right 08/07/2016   Chronic pain syndrome 02/25/2016   Long term current use of opiate analgesic 02/25/2016   Chronic right shoulder pain 02/25/2016   Status post reverse total shoulder replacement 10/08/2015   Neuropathy of right lateral femoral cutaneous nerve 09/23/2015   Obesity, Class III, BMI 40-49.9 (morbid obesity) ([place]) 09/23/2015   Chronic prescription benzodiazepine use 09/23/2015   Arthralgia 09/23/2015   Osteoarthritis involving multiple joints 09/23/2015   Other shoulder lesions (Right) 08/28/2015   Cervical radiculitis 08/06/2015   Fall 05/22/2015   Opiate use (15 MME/Day) 01/16/2015   Impingement syndrome of shoulder region 01/07/2015   Mixed incontinence 11/26/2014   Atrophic vaginitis 11/26/2014   Anxiety and depression 09/21/2014   Bladder cystocele 09/21/2014   Gastro-esophageal reflux disease without esophagitis 09/21/2014   DM (diabetes mellitus) type II, controlled, with peripheral vascular disorder ([place]) 08/31/2014   Diabetic peripheral neuropathy associated with type 2 diabetes mellitus ([place]) 08/31/2014   Asthma, well controlled 08/31/2014   Hypothyroidism, unspecified 08/31/2014   Peripheral vascular  disease due to secondary diabetes mellitus ([place]) 12/11/2013   OSA (obstructive sleep apnea) 10/19/2013   AF (paroxysmal atrial fibrillation) ([place]) 07/27/2013   Essential hypertension 07/27/2013   Chronic atrial fibrillation 08/10/2012    Palliative Care Assessment & Plan   Recommendations/Plan: Recommend palliative to follow outpatient.     Code Status:    Code Status Orders  (From admission, onward)         Start     Ordered   04/06/20 1711  Full code  Continuous        04/06/20 1712        Code Status History    Date Active Date Inactive Code Status Order ID Comments User Context   02/06/2020 0129 02/10/2020 2154 Full Code [id number]  Toy Baker, MD Inpatient   12/23/2019 0126 12/28/2019 0036 Full Code [id number]  Mansy, Arvella Merles, MD ED   09/02/2019 1303 09/09/2019 0037 Full Code [id number]  Collier Bullock, MD ED   07/29/2018 0055 07/30/2018 1517 Full Code [id number]  Mayer Camel, NP ED   04/04/2018 1010 04/04/2018 1526 Full Code [id number]  [name], MD Inpatient   09/25/2016 0916 09/26/2016 2106 Full Code [id number] Full code with ACLS Protocol WITH NO CHEST COMPRESSIONS. Ventricular Assist Device in place. [name], [facility] Inpatient   09/25/2016 [id number] 09/25/2016 0916 Full Code [id number] Full code with ACLS Protocol WITH NO CHEST COMPRESSIONS. Ventricular Assist Device in place. [name], [facility] Inpatient   09/25/2016 0841 09/25/2016 0841 Partial Code [id number] Ventricular Assist Device in place.  No Chest Compressions. [name], DPM  Inpatient   09/05/2016 1628 09/09/2016 2215 Full Code [id number]  Quintella Baton, MD ED   08/06/2016 2021 08/07/2016 1727 Full Code [id number]  [name], MD Inpatient   08/06/2016 1403 08/06/2016 2021 Full Code [id number]  Poggi, Marshall Cork, MD Inpatient   10/08/2015 1206 10/13/2015 1734 Full Code [id number]  Poggi, Marshall Cork, MD Inpatient   Advance Care Planning Activity    Advance Directive Documentation   Flowsheet Row Most Recent Value   Type of Advance Directive Living will  Pre-existing out of facility DNR order (yellow form or pink MOST form) --  "MOST" Form in Place? --      Prognosis:  Unable to determine   Thank you for allowing the Palliative Medicine Team to assist in the care of this patient.   Total Time 25 min Prolonged Time Billed  no      Greater than 50%  of this time was spent counseling and coordinating care related to the above assessment and plan.  Asencion Gowda, NP  Please contact Palliative Medicine Team phone at [phone] for questions and concerns.

## 2020-04-12 NOTE — Progress Notes (Signed)
PROGRESS NOTE    [name]  IRC:[id number] DOB: [birth year] DOA: 04/06/2020 PCP: [name], [name], MD   Brief Narrative: This 70 y.o.adultwith PMH significant forCVA, hypertension, diabetes mellitus type 2, morbid obesity, chronic pain, paroxysmal atrial fibrillation, previous DVT, OSA on CPAP, was sent from her primary care provider office for the evaluation due to new stroke. Patient states that the she has a left arm and left leg weakness for the last 2 or 3 months. But she is very vague about her symptoms, she states that she might had worsening weakness 5 days ago. MRI of the brain showed several small infarctsin the right side of brain.Neurology believes this is due to small vessel disease as most of the lesions changes in the white matter. Neurology signed off.  Assessment & Plan:   Active Problems:   AF (paroxysmal atrial fibrillation) (HCC)   OSA (obstructive sleep apnea)   Diabetic peripheral neuropathy associated with type 2 diabetes mellitus (HCC)   Obesity, Class III, BMI 40-49.9 (morbid obesity) ([place])   Obstructive sleep apnea of adult   Diabetic ulcer of left foot ([place])   Acute embolic stroke ([place])   Stroke ([place])   Nonsustained ventricular tachycardia (HCC)  Left sided weakness could be secondary to CVA: Exact time of onset unknown.  History is vague. Patient does have  history of paroxysmal A. fib and stroke. Patient has been evaluated by neurology, initially suspect stroke, but neurology has ruled that out.  Presentation more consistent with  small vessel disease on MRI. She has been seen by physical therapy/Occupational Therapy.  \ She is on Eliquis for anticoagulation. Neurology signed off.  PT and OT recommended SNF placement.  Patient awaiting insurance authorization for SNF placement  Nausea >>> resolved Condition improved after giving Protonix.   No need for gastric emptying study. Metformin and hydrochlorothiazide is  discontinued.  Nonsustained ventricular tachycardia. Patient had 1 episode of 8 beats.  No additional episodes. Continue beta-blocker.  Echocardiogram showed normal ejection fraction.  Left second toe wound.  Bilateral foot pain. No need for surgery per podiatry.  Bilateral foot pain improving.  Type 2 diabetes.   Continue current regimen. Regular insulin sliding scale.   DVT prophylaxis: Eliquis Code Status: Full code. Family Communication:  No family at bed side. Disposition Plan:   Status is: Inpatient  Remains inpatient appropriate because:Inpatient level of care appropriate due to severity of illness   Dispo: The patient is from: Home              Anticipated d/c is to: SNF              Anticipated d/c date is: > 3 days              Patient currently is not medically stable to d/c.   Consultants:    Neurology  Procedures: None Antimicrobials:  Anti-infectives (From admission, onward)   None     Subjective: Patient was seen and examined at bedside.  Overnight events noted.  Patient was sitting on the bed participated in physical therapy she reports having burning pain in the bottom of her feet.  Objective: Vitals:   04/11/20 2343 04/12/20 0505 04/12/20 0841 04/12/20 1125  BP: 117/79 112/73 119/61 125/71  Pulse: (!) 59 (!) 53 62 65  Resp:   18 16  Temp: 98 F (36.7 C) 97.9 F (36.6 C) 98.8 F (37.1 C) 98.2 F (36.8 C)  TempSrc: Oral  Oral Oral  SpO2: 97% 95% 93%  96%  Weight:      Height:        Intake/Output Summary (Last 24 hours) at 04/12/2020 1406 Last data filed at 04/12/2020 0930 Gross per 24 hour  Intake 310 ml  Output 1650 ml  Net -1340 ml   Filed Weights   04/08/20 1721  Weight: 111.3 kg    Examination:  General exam: Appears calm and comfortable, not in any acute distress Respiratory system: Clear to auscultation. Respiratory effort normal. Cardiovascular system: S1 & S2 heard, RRR. No JVD, murmurs, rubs, gallops or clicks. No  pedal edema. Gastrointestinal system: Abdomen is nondistended, soft and nontender. No organomegaly or masses felt. Normal bowel sounds heard. Central nervous system: Alert and oriented. No focal neurological deficits. Extremities: Symmetric 5 x 5 power. Skin: No rashes, lesions or ulcers Psychiatry: Judgement and insight appear normal. Mood & affect appropriate.     Data Reviewed: I have personally reviewed following labs and imaging studies  CBC: Recent Labs  Lab 04/10/20 0605  WBC 6.1  HGB 9.5*  HCT 29.6*  MCV 84.6  PLT [id number]   Basic Metabolic Panel: Recent Labs  Lab 04/06/20 1627 04/10/20 0605  NA 140 143  K 4.8 4.4  CL 102 104  CO2 27 30  GLUCOSE 107* 111*  BUN 25* 19  CREATININE 1.11* 0.85  CALCIUM 9.1 9.1  MG  --  1.8   GFR: Estimated Creatinine Clearance (by C-G formula based on SCr of 0.85 mg/dL) Female: 76.5 mL/min Female: 93.1 mL/min Liver Function Tests: No results for input(s): AST, ALT, ALKPHOS, BILITOT, PROT, ALBUMIN in the last 168 hours. No results for input(s): LIPASE, AMYLASE in the last 168 hours. No results for input(s): AMMONIA in the last 168 hours. Coagulation Profile: Recent Labs  Lab 04/06/20 1639  INR 1.6*   Cardiac Enzymes: No results for input(s): CKTOTAL, CKMB, CKMBINDEX, TROPONINI in the last 168 hours. BNP (last 3 results) No results for input(s): PROBNP in the last 8760 hours. HbA1C: No results for input(s): HGBA1C in the last 72 hours. CBG: Recent Labs  Lab 04/07/20 0751  GLUCAP 102*   Lipid Profile: No results for input(s): CHOL, HDL, LDLCALC, TRIG, CHOLHDL, LDLDIRECT in the last 72 hours. Thyroid Function Tests: No results for input(s): TSH, T4TOTAL, FREET4, T3FREE, THYROIDAB in the last 72 hours. Anemia Panel: No results for input(s): VITAMINB12, FOLATE, FERRITIN, TIBC, IRON, RETICCTPCT in the last 72 hours. Sepsis Labs: No results for input(s): PROCALCITON, LATICACIDVEN in the last 168 hours.  Recent Results (from  the past 240 hour(s))  Resp Panel by RT-PCR (Flu A&B, Covid) Nasopharyngeal Swab     Status: None   Collection Time: 04/06/20  4:27 PM   Specimen: Nasopharyngeal Swab; Nasopharyngeal(NP) swabs in vial transport medium  Result Value Ref Range Status   SARS Coronavirus 2 by RT PCR NEGATIVE NEGATIVE Final    Comment: (NOTE) SARS-CoV-2 target nucleic acids are NOT DETECTED.  The SARS-CoV-2 RNA is generally detectable in upper respiratory specimens during the acute phase of infection. The lowest concentration of SARS-CoV-2 viral copies this assay can detect is 138 copies/mL. A negative result does not preclude SARS-Cov-2 infection and should not be used as the sole basis for treatment or other patient management decisions. A negative result may occur with  improper specimen collection/handling, submission of specimen other than nasopharyngeal swab, presence of viral mutation(s) within the areas targeted by this assay, and inadequate number of viral copies(<138 copies/mL). A negative result must be combined with clinical observations, patient  history, and epidemiological information. The expected result is Negative.  Fact Sheet for Patients:  EntrepreneurPulse.com.au  Fact Sheet for Healthcare Providers:  IncredibleEmployment.be  This test is no t yet approved or cleared by the [place] FDA and  has been authorized for detection and/or diagnosis of SARS-CoV-2 by FDA under an Emergency Use Authorization (EUA). This EUA will remain  in effect (meaning this test can be used) for the duration of the COVID-19 declaration under Section 564(b)(1) of the Act, 21 U.S.C.section 360bbb-3(b)(1), unless the authorization is terminated  or revoked sooner.       Influenza A by PCR NEGATIVE NEGATIVE Final   Influenza B by PCR NEGATIVE NEGATIVE Final    Comment: (NOTE) The Xpert Xpress SARS-CoV-2/FLU/RSV plus assay is intended as an aid in the diagnosis of  influenza from Nasopharyngeal swab specimens and should not be used as a sole basis for treatment. Nasal washings and aspirates are unacceptable for Xpert Xpress SARS-CoV-2/FLU/RSV testing.  Fact Sheet for Patients: EntrepreneurPulse.com.au  Fact Sheet for Healthcare Providers: IncredibleEmployment.be  This test is not yet approved or cleared by the [place] FDA and has been authorized for detection and/or diagnosis of SARS-CoV-2 by FDA under an Emergency Use Authorization (EUA). This EUA will remain in effect (meaning this test can be used) for the duration of the COVID-19 declaration under Section 564(b)(1) of the Act, 21 U.S.C. section 360bbb-3(b)(1), unless the authorization is terminated or revoked.  Performed at [facility], [address] [place], [place] [address]          Radiology Studies: No results found.      Scheduled Meds: .  stroke: mapping our early stages of recovery book   Does not apply Once  . amLODipine  5 mg Oral Daily  . apixaban  5 mg Oral BID  . vitamin C  250 mg Oral BID  . buPROPion  150 mg Oral Daily  . cholecalciferol  1,000 Units Oral BID WC  . DULoxetine  60 mg Oral Daily  . feeding supplement  237 mL Oral Q24H  . fesoterodine  8 mg Oral Daily  . folic acid  1 mg Oral Daily  . ipratropium-albuterol  3 mL Nebulization BID  . levothyroxine  175 mcg Oral Q0600  . linagliptin  5 mg Oral Daily  . losartan  50 mg Oral Daily  . metoprolol succinate  50 mg Oral Daily  . mometasone-formoterol  2 puff Inhalation BID  . multivitamin with minerals  1 tablet Oral Daily  . oxybutynin  10 mg Oral QHS  . pantoprazole  40 mg Oral Daily  . rosuvastatin  20 mg Oral Daily  . sucralfate  1 g Oral TID WC & HS  . thiamine  50 mg Oral BID WC  . cyanocobalamin  1,000 mcg Oral Daily  . Vitamin D (Ergocalciferol)  50,000 Units Oral Weekly   Continuous Infusions:   LOS: 5 days    Time spent: 25  mins    Shawna Clamp, MD [facility]   If 7PM-7AM, please contact night-coverage

## 2020-04-12 NOTE — Care Management Important Message (Signed)
Important Message  Patient Details  Name: [name] MRN: [id number] Date of Birth: 1950/08/08   Medicare Important Message Given:  Yes     [name] 04/12/2020, 11:38 AM

## 2020-04-12 NOTE — Progress Notes (Signed)
Physical Therapy Treatment Patient Details Name: [name] MRN: [id number] DOB: 01-Apr-1950 Today's Date: 04/12/2020    History of Present Illness Pt is a 70 y/o F admitted on 04/06/20 after presenting to ED for further evaluation of a new stroke by guidance of her PCP. MRI revealed recurrent stroke/acute punctate focus of restricted diffusion in the right external capsule region. PMH: CVA (October & November 2021), HTN, DM2, morbid obesity, chronic pain, paroxysmal a-fib, DVT, OSA on CPAP, raynaud's phenomenon, anxiety, depression, asthma, cystocele, dyspnea, GERD, heart murmur, IBS, HLD, BLE neuropathy, obesity, PVD, urinary incontinence    PT Comments    Pt seen for PT/OT co-tx for pt & therapist safety. Pt requires less assistance for supine<>sit on this date. Pt continues to be limited by BLE pain with weight bearing (even anterior weight shift in sitting causes increased pain) - MD made aware.  Pt completes sit<>stand attempts x 3 with use of sheet under buttocks, therapists blocking B knees, & therapists manually facilitating & educating pt on anterior weight shifting to increase ease of transfer. Pt demonstrates posterior lean & requires MAX multimodal cuing for anterior weight shifting with pt only shifting forward to neutral with pt resisting anterior weight shifting with BUE on RW despite ongoing education re: need to do so. Pt requires TOTAL +2 to achieve sit<>stand from EOB but with minimal glute/hip activation to assist with coming to full upright standing and decreased anterior pelvic shift. Attempted to use [place] sit<>stand lift to assist pt with sit>stand but unable to locate appropriate sized sling. Pt performs LE LAQ seated EOB for strengthening. After returning to semi fowler in bed PT instructed pt on BLE heel cord stretch with use of sheet & PT placing sheet on each foot as pt unable to do so. Pt unable to use LUE to hold sheet & with weakness in RUE with difficulty holding  stretch for full 30 seconds. Pt performed 3 x 30 sec heel cord stretch with max cuing from PT; encouraged pt to have nursing staff assist her with stretches 3x/day. Pt would benefit from ongoing PT while in acute setting to progress transfers & BLE weight bearing for strengthening. Pt reports her husband is not well as he fell at home around Christmas time & is unable to care for her. Pt is unsafe to d/c home at this time & PT currently recommends d/c to STR to maximize independence with functional mobility.   Follow Up Recommendations  SNF;Supervision/Assistance - 24 hour     Equipment Recommendations  None recommended by PT    Recommendations for Other Services       Precautions / Restrictions Precautions Precautions: Fall Precaution Comments: per last admission, recommendation to minimize weight bearing through B heels (pt used personal wedge shoes from home) Restrictions Weight Bearing Restrictions: Yes LLE Weight Bearing: Weight bearing as tolerated Other Position/Activity Restrictions: per podiatry WBAT and rec gastroc/soleus stretching    Mobility  Bed Mobility Overal bed mobility: Needs Assistance Bed Mobility: Supine to Sit;Sit to Supine     Supine to sit: Min assist;+2 for physical assistance Sit to supine: Mod assist   General bed mobility comments: patient needs min/mod assist to raise trunk to seated position , increased time, use of bed rails; +2 to scoot to [facility]  Transfers Overall transfer level: Needs assistance Equipment used: Rolling walker (2 wheeled) Transfers: Sit to/from Stand Sit to Stand: Total assist;+2 physical assistance;From elevated surface         General transfer comment: pt  unable to lean weight forward for WBing on B heels in sitting. Clears rear c TOTAL A X2 + RW + sheet under rear; however unable to achieve fully upright posture as pt is extremly anxious and pain limited  Ambulation/Gait                 Stairs              Wheelchair Mobility    Modified Rankin (Stroke Patients Only)       Balance Overall balance assessment: Needs assistance Sitting-balance support: Feet supported;Single extremity supported Sitting balance-Leahy Scale: Good Sitting balance - Comments: supervision only. Good sitting balance.   Standing balance support: Bilateral upper extremity supported;During functional activity Standing balance-Leahy Scale: Zero Standing balance comment: total assist +2 with BUE support on RW                            Cognition Arousal/Alertness: Awake/alert Behavior During Therapy: Anxious Overall Cognitive Status: Within Functional Limits for tasks assessed                                 General Comments: Anxious re: mobility & pain      Exercises General Exercises - Lower Extremity Long Arc Quad: AROM;Right;Left;20 reps;Seated     General Comments        Pertinent Vitals/Pain Pain Assessment: Faces Faces Pain Scale: Hurts even more Pain Location: B feet, L arm Pain Descriptors / Indicators: Grimacing;Guarding;Discomfort;Sore Pain Intervention(s): Monitored during session;Limited activity within patient's tolerance;Repositioned    Home Living                      Prior Function            PT Goals (current goals can now be found in the care plan section) Acute Rehab PT Goals Patient Stated Goal: to walk again PT Goal Formulation: With patient Time For Goal Achievement: 04/21/20 Potential to Achieve Goals: Fair Progress towards PT goals: PT to reassess next treatment    Frequency    Min 2X/week      PT Plan Current plan remains appropriate    Co-evaluation PT/OT/SLP Co-Evaluation/Treatment: Yes Reason for Co-Treatment: For patient/therapist safety;To address functional/ADL transfers PT goals addressed during session: Mobility/safety with mobility;Strengthening/ROM;Balance OT goals addressed during session: ADL's and  self-care      AM-PAC PT "6 Clicks" Mobility   Outcome Measure  Help needed turning from your back to your side while in a flat bed without using bedrails?: A Lot Help needed moving from lying on your back to sitting on the side of a flat bed without using bedrails?: A Lot Help needed moving to and from a bed to a chair (including a wheelchair)?: Total Help needed standing up from a chair using your arms (e.g., wheelchair or bedside chair)?: Total Help needed to walk in hospital room?: Total Help needed climbing 3-5 steps with a railing? : Total 6 Click Score: 8    End of Session   Activity Tolerance: Patient limited by pain Patient left: in bed;with call bell/phone within reach;with bed alarm set Nurse Communication:  (benefits of using hoyer lift for OOB to recliner during the day) PT Visit Diagnosis: Other abnormalities of gait and mobility (R26.89);Difficulty in walking, not elsewhere classified (R26.2);Pain Pain - Right/Left:  (B feet & L arm) Pain - part of body: Arm;Ankle and  joints of foot     Time: 6122-4497 PT Time Calculation (min) (ACUTE ONLY): 41 min  Charges:  $Therapeutic Activity: 23-37 mins                     Lavone Nian, PT, DPT 04/12/20, 10:22 AM    Waunita Schooner 04/12/2020, 10:17 AM

## 2020-04-12 NOTE — TOC Progression Note (Signed)
Transition of Care [facility]) - Progression Note    Patient Details  Name: [name] MRN: [id number] Date of Birth: [birth year]  Transition of Care [facility]) CM/SW Contact  Shelbie Ammons, RN Phone Number: 04/12/2020, 12:12 PM  Clinical Narrative:   RNCM received communication from Burton with [facility], she will have a bed available for patient on Monday. She has started insurance authorization.     Expected Discharge Plan: Skilled Nursing Facility Barriers to Discharge: Continued Medical Work up,SNF Pending bed offer  Expected Discharge Plan and Services Expected Discharge Plan: [place] In-house Referral: Clinical Social Work   Post Acute Care Choice: [place] Living arrangements for the past 2 months: [place] Determinants of Health (SDOH) Interventions    Readmission Risk Interventions Readmission Risk Prevention Plan 07/30/2018  Transportation Screening Complete  PCP or Specialist Appt within 3-5 Days Complete  HRI or Home Care Consult Complete  Social Work Consult for [place] Planning/Counseling Patient refused  Palliative Care Screening Patient Refused  Medication Review [employer]) Complete  Some recent data might be hidden

## 2020-04-13 DIAGNOSIS — I480 Paroxysmal atrial fibrillation: Secondary | ICD-10-CM | POA: Diagnosis not present

## 2020-04-13 LAB — CBC
HCT: 33.1 % — ABNORMAL LOW (ref 36.0–46.0)
Hemoglobin: 10.3 g/dL — ABNORMAL LOW (ref 12.0–15.0)
MCH: 26.6 pg (ref 26.0–34.0)
MCHC: 31.1 g/dL (ref 30.0–36.0)
MCV: 85.5 fL (ref 80.0–100.0)
Platelets: 227 10*3/uL (ref 150–400)
RBC: 3.87 MIL/uL (ref 3.87–5.11)
RDW: 16.4 % — ABNORMAL HIGH (ref 11.5–15.5)
WBC: 6.9 10*3/uL (ref 4.0–10.5)
nRBC: 0 % (ref 0.0–0.2)

## 2020-04-13 LAB — GLUCOSE, CAPILLARY
Glucose-Capillary: 134 mg/dL — ABNORMAL HIGH (ref 70–99)
Glucose-Capillary: 143 mg/dL — ABNORMAL HIGH (ref 70–99)

## 2020-04-13 NOTE — Progress Notes (Addendum)
PROGRESS NOTE    [name]  IRJ:[id number] DOB: Jan 15, 1951 DOA: 04/06/2020   PCP: [name], [name], MD   Brief Narrative: This 70 y.o.adultwith PMH significant forCVA, hypertension, diabetes mellitus type 2, morbid obesity, chronic pain, paroxysmal atrial fibrillation, previous DVT, OSA on CPAP, was sent from her primary care provider office for the evaluation due to new stroke. Patient states that the she has a left arm and left leg weakness for the last 2 or 3 months. but she is very vague about her symptoms, she states that she might had worsening weakness 5 days ago. MRI of the brain showed several small infarctsin the right side of brain. Neurology believes this is due to small vessel disease as most of the lesions changes are in the white matter. Neurology signed off.  Assessment & Plan:   Active Problems:   AF (paroxysmal atrial fibrillation) (HCC)   OSA (obstructive sleep apnea)   Diabetic peripheral neuropathy associated with type 2 diabetes mellitus (HCC)   Obesity, Class III, BMI 40-49.9 (morbid obesity) ([place])   Obstructive sleep apnea of adult   Diabetic ulcer of left foot ([place])   Acute embolic stroke ([place])   Stroke ([place])   Nonsustained ventricular tachycardia (HCC)  Left sided weakness could be secondary to CVA: Exact time of onset unknown.  History is vague. Patient does have  history of paroxysmal A. fib and stroke. Patient has been evaluated by neurology, initially suspect stroke, but neurology has ruled that out.  Presentation more consistent with  small vessel disease on MRI. She has been seen by physical therapy/Occupational Therapy.   She is on Eliquis for anticoagulation. Neurology signed off.  PT and OT recommended SNF placement.  Patient awaiting insurance authorization for SNF placement  Nausea >>> resolved Condition improved after giving Protonix.   No need for gastric emptying study. Metformin and hydrochlorothiazide is  discontinued.  Nonsustained ventricular tachycardia. Patient had 1 episode of 8 beats.  No additional episodes. Continue beta-blocker.  Echocardiogram showed normal ejection fraction.  Left second toe wound.  Bilateral foot pain. No need for surgery per podiatry.  Bilateral foot pain improving.  Type 2 diabetes.   Continue current regimen. Regular insulin sliding scale.   DVT prophylaxis: Eliquis Code Status: Full code. Family Communication:  No family at bed side. Disposition Plan:   Status is: Inpatient  Remains inpatient appropriate because:Inpatient level of care appropriate due to severity of illness   Dispo: The patient is from: Home              Anticipated d/c is to: SNF              Anticipated d/c date is: > 3 days              Patient currently is not medically stable to d/c.   Consultants:    Neurology  Procedures: None Antimicrobials:  Anti-infectives (From admission, onward)   None     Subjective: Patient was seen and examined at bedside.  Overnight events noted.  Patient was lying comfortably on the bed,  still reports having burning pain in the bottom of the feet but denies any other symptoms.   Objective: Vitals:   04/12/20 2014 04/12/20 2350 04/13/20 0420 04/13/20 0805  BP:  116/64 133/73 127/63  Pulse:  63 61 (!) 59  Resp:      Temp:  97.7 F (36.5 C) 97.6 F (36.4 C) 97.8 F (36.6 C)  TempSrc:  Oral  SpO2: 98% 97% 96% 94%  Weight:      Height:        Intake/Output Summary (Last 24 hours) at 04/13/2020 1353 Last data filed at 04/13/2020 1026 Gross per 24 hour  Intake 100 ml  Output 350 ml  Net -250 ml   Filed Weights   04/08/20 1721  Weight: 111.3 kg    Examination:  General exam: Appears calm and comfortable, not in any acute distress Respiratory system: Clear to auscultation. Respiratory effort normal. Cardiovascular system: S1 & S2 heard, RRR. No JVD, murmurs, rubs, gallops or clicks. No pedal  edema. Gastrointestinal system: Abdomen is nondistended, soft and nontender. No organomegaly or masses felt. Normal bowel sounds heard. Central nervous system: Alert and oriented. No focal neurological deficits. Extremities: Left arm and left leg weakness. Sensations intact. Skin: No rashes, lesions or ulcers Psychiatry: Judgement and insight appear normal. Mood & affect appropriate.     Data Reviewed: I have personally reviewed following labs and imaging studies  CBC: Recent Labs  Lab 04/10/20 0605 04/13/20 0611  WBC 6.1 6.9  HGB 9.5* 10.3*  HCT 29.6* 33.1*  MCV 84.6 85.5  PLT 227 102   Basic Metabolic Panel: Recent Labs  Lab 04/06/20 1627 04/10/20 0605  NA 140 143  K 4.8 4.4  CL 102 104  CO2 27 30  GLUCOSE 107* 111*  BUN 25* 19  CREATININE 1.11* 0.85  CALCIUM 9.1 9.1  MG  --  1.8   GFR: Estimated Creatinine Clearance (by C-G formula based on SCr of 0.85 mg/dL) Female: 76.5 mL/min Female: 93.1 mL/min Liver Function Tests: No results for input(s): AST, ALT, ALKPHOS, BILITOT, PROT, ALBUMIN in the last 168 hours. No results for input(s): LIPASE, AMYLASE in the last 168 hours. No results for input(s): AMMONIA in the last 168 hours. Coagulation Profile: Recent Labs  Lab 04/06/20 1639  INR 1.6*   Cardiac Enzymes: No results for input(s): CKTOTAL, CKMB, CKMBINDEX, TROPONINI in the last 168 hours. BNP (last 3 results) No results for input(s): PROBNP in the last 8760 hours. HbA1C: No results for input(s): HGBA1C in the last 72 hours. CBG: Recent Labs  Lab 04/07/20 0751 04/13/20 1220  GLUCAP 102* 134*   Lipid Profile: No results for input(s): CHOL, HDL, LDLCALC, TRIG, CHOLHDL, LDLDIRECT in the last 72 hours. Thyroid Function Tests: No results for input(s): TSH, T4TOTAL, FREET4, T3FREE, THYROIDAB in the last 72 hours. Anemia Panel: No results for input(s): VITAMINB12, FOLATE, FERRITIN, TIBC, IRON, RETICCTPCT in the last 72 hours. Sepsis Labs: No results for  input(s): PROCALCITON, LATICACIDVEN in the last 168 hours.  Recent Results (from the past 240 hour(s))  Resp Panel by RT-PCR (Flu A&B, Covid) Nasopharyngeal Swab     Status: None   Collection Time: 04/06/20  4:27 PM   Specimen: Nasopharyngeal Swab; Nasopharyngeal(NP) swabs in vial transport medium  Result Value Ref Range Status   SARS Coronavirus 2 by RT PCR NEGATIVE NEGATIVE Final    Comment: (NOTE) SARS-CoV-2 target nucleic acids are NOT DETECTED.  The SARS-CoV-2 RNA is generally detectable in upper respiratory specimens during the acute phase of infection. The lowest concentration of SARS-CoV-2 viral copies this assay can detect is 138 copies/mL. A negative result does not preclude SARS-Cov-2 infection and should not be used as the sole basis for treatment or other patient management decisions. A negative result may occur with  improper specimen collection/handling, submission of specimen other than nasopharyngeal swab, presence of viral mutation(s) within the areas targeted by this assay,  and inadequate number of viral copies(<138 copies/mL). A negative result must be combined with clinical observations, patient history, and epidemiological information. The expected result is Negative.  Fact Sheet for Patients:  EntrepreneurPulse.com.au  Fact Sheet for Healthcare Providers:  IncredibleEmployment.be  This test is no t yet approved or cleared by the [place] FDA and  has been authorized for detection and/or diagnosis of SARS-CoV-2 by FDA under an Emergency Use Authorization (EUA). This EUA will remain  in effect (meaning this test can be used) for the duration of the COVID-19 declaration under Section 564(b)(1) of the Act, 21 U.S.C.section 360bbb-3(b)(1), unless the authorization is terminated  or revoked sooner.       Influenza A by PCR NEGATIVE NEGATIVE Final   Influenza B by PCR NEGATIVE NEGATIVE Final    Comment: (NOTE) The  Xpert Xpress SARS-CoV-2/FLU/RSV plus assay is intended as an aid in the diagnosis of influenza from Nasopharyngeal swab specimens and should not be used as a sole basis for treatment. Nasal washings and aspirates are unacceptable for Xpert Xpress SARS-CoV-2/FLU/RSV testing.  Fact Sheet for Patients: EntrepreneurPulse.com.au  Fact Sheet for Healthcare Providers: IncredibleEmployment.be  This test is not yet approved or cleared by the [place] FDA and has been authorized for detection and/or diagnosis of SARS-CoV-2 by FDA under an Emergency Use Authorization (EUA). This EUA will remain in effect (meaning this test can be used) for the duration of the COVID-19 declaration under Section 564(b)(1) of the Act, 21 U.S.C. section 360bbb-3(b)(1), unless the authorization is terminated or revoked.  Performed at [facility], [address] [place], [place] [address]     Radiology Studies: No results found.  Scheduled Meds: .  stroke: mapping our early stages of recovery book   Does not apply Once  . amLODipine  5 mg Oral Daily  . apixaban  5 mg Oral BID  . vitamin C  250 mg Oral BID  . buPROPion  150 mg Oral Daily  . cholecalciferol  1,000 Units Oral BID WC  . DULoxetine  60 mg Oral Daily  . feeding supplement  237 mL Oral Q24H  . fesoterodine  8 mg Oral Daily  . folic acid  1 mg Oral Daily  . ipratropium-albuterol  3 mL Nebulization BID  . levothyroxine  175 mcg Oral Q0600  . linagliptin  5 mg Oral Daily  . losartan  50 mg Oral Daily  . metoprolol succinate  50 mg Oral Daily  . mometasone-formoterol  2 puff Inhalation BID  . multivitamin with minerals  1 tablet Oral Daily  . oxybutynin  10 mg Oral QHS  . pantoprazole  40 mg Oral Daily  . rosuvastatin  20 mg Oral Daily  . sucralfate  1 g Oral TID WC & HS  . thiamine  50 mg Oral BID WC  . cyanocobalamin  1,000 mcg Oral Daily  . Vitamin D (Ergocalciferol)  50,000 Units Oral  Weekly   Continuous Infusions:   LOS: 6 days    Time spent: 25 mins    Shawna Clamp, MD [facility]   If 7PM-7AM, please contact night-coverage

## 2020-04-13 NOTE — Plan of Care (Signed)
  Problem: Education: Goal: Knowledge of General Education information will improve Description: Including pain rating scale, medication(s)/side effects and non-pharmacologic comfort measures Outcome: Progressing   Problem: Health Behavior/Discharge Planning: Goal: Ability to manage health-related needs will improve Outcome: Progressing   Problem: Clinical Measurements: Goal: Ability to maintain clinical measurements within normal limits will improve Outcome: Progressing Goal: Will remain free from infection Outcome: Progressing Goal: Diagnostic test results will improve Outcome: Progressing Goal: Respiratory complications will improve Outcome: Progressing Goal: Cardiovascular complication will be avoided Outcome: Progressing   Problem: Activity: Goal: Risk for activity intolerance will decrease Outcome: Progressing   Problem: Nutrition: Goal: Adequate nutrition will be maintained Outcome: Progressing   Problem: Coping: Goal: Level of anxiety will decrease Outcome: Progressing   Problem: Elimination: Goal: Will not experience complications related to bowel motility Outcome: Progressing Goal: Will not experience complications related to urinary retention Outcome: Progressing   Problem: Pain Managment: Goal: General experience of comfort will improve Outcome: Progressing   Problem: Safety: Goal: Ability to remain free from injury will improve Outcome: Progressing   Problem: Skin Integrity: Goal: Risk for impaired skin integrity will decrease Outcome: Progressing   Problem: Education: Goal: Knowledge of disease or condition will improve Outcome: Progressing Goal: Knowledge of secondary prevention will improve Outcome: Progressing Goal: Knowledge of patient specific risk factors addressed and post discharge goals established will improve Outcome: Progressing Goal: Individualized Educational Video(s) Outcome: Progressing   Problem: Coping: Goal: Will verbalize  positive feelings about self Outcome: Progressing Goal: Will identify appropriate support needs Outcome: Progressing   Problem: Health Behavior/Discharge Planning: Goal: Ability to manage health-related needs will improve Outcome: Progressing   Problem: Self-Care: Goal: Ability to participate in self-care as condition permits will improve Outcome: Progressing Goal: Verbalization of feelings and concerns over difficulty with self-care will improve Outcome: Progressing Goal: Ability to communicate needs accurately will improve Outcome: Progressing   Problem: Nutrition: Goal: Risk of aspiration will decrease Outcome: Progressing Goal: Dietary intake will improve Outcome: Progressing   Problem: Intracerebral Hemorrhage Tissue Perfusion: Goal: Complications of Intracerebral Hemorrhage will be minimized Outcome: Progressing   Problem: Ischemic Stroke/TIA Tissue Perfusion: Goal: Complications of ischemic stroke/TIA will be minimized Outcome: Progressing   Problem: Spontaneous Subarachnoid Hemorrhage Tissue Perfusion: Goal: Complications of Spontaneous Subarachnoid Hemorrhage will be minimized Outcome: Progressing   

## 2020-04-14 DIAGNOSIS — I480 Paroxysmal atrial fibrillation: Secondary | ICD-10-CM | POA: Diagnosis not present

## 2020-04-14 LAB — BASIC METABOLIC PANEL
Anion gap: 12 (ref 5–15)
BUN: 25 mg/dL — ABNORMAL HIGH (ref 8–23)
CO2: 25 mmol/L (ref 22–32)
Calcium: 8.8 mg/dL — ABNORMAL LOW (ref 8.9–10.3)
Chloride: 101 mmol/L (ref 98–111)
Creatinine, Ser: 0.86 mg/dL (ref 0.44–1.00)
GFR, Estimated: >60 mL/min (ref >60)
Glucose, Bld: 113 mg/dL — ABNORMAL HIGH (ref 70–99)
Potassium: 4.7 mmol/L (ref 3.5–5.1)
Sodium: 138 mmol/L (ref 135–145)

## 2020-04-14 LAB — MAGNESIUM: Magnesium: 2.1 mg/dL (ref 1.7–2.4)

## 2020-04-14 LAB — PHOSPHORUS: Phosphorus: 4.1 mg/dL (ref 2.5–4.6)

## 2020-04-14 MED ORDER — BACITRACIN ZINC 500 UNIT/GM EX OINT
TOPICAL_OINTMENT | Freq: Once | CUTANEOUS | Status: DC
  Filled 2020-04-14 (×2): qty 4.5

## 2020-04-14 NOTE — Progress Notes (Signed)
Stroke., Early Stages of Recovery booklet reviewed with pt and encouraged her to read.

## 2020-04-14 NOTE — Progress Notes (Signed)
PROGRESS NOTE    [name]  YOV:[id number] DOB: Nov 15, 1950 DOA: 04/06/2020   PCP: [name], [name], MD   Brief Narrative: This 70 y.o.adultwith PMH significant forCVA, hypertension, diabetes mellitus type 2, morbid obesity, chronic pain, paroxysmal atrial fibrillation, previous DVT, OSA on CPAP, was sent from her primary care provider office for the evaluation due to new stroke. Patient states that the she has a left arm and left leg weakness for the last 2 or 3 months. but she is very vague about her symptoms, she states that she might had worsening weakness 5 days ago. MRI of the brain showed several small infarctsin the right side of brain. Neurology believes this is due to small vessel disease as most of the lesions changes are in the white matter. Neurology signed off.  Assessment & Plan:   Active Problems:   AF (paroxysmal atrial fibrillation) (HCC)   OSA (obstructive sleep apnea)   Diabetic peripheral neuropathy associated with type 2 diabetes mellitus (HCC)   Obesity, Class III, BMI 40-49.9 (morbid obesity) ([place])   Obstructive sleep apnea of adult   Diabetic ulcer of left foot ([place])   Acute embolic stroke ([place])   Stroke ([place])   Nonsustained ventricular tachycardia (HCC)  Left sided weakness could be secondary to CVA: Exact time of onset unknown.  History is vague. Patient does have  history of paroxysmal A. fib and stroke. Patient has been evaluated by neurology, initially suspect stroke, but neurology has ruled that out.   Presentation more consistent with  small vessel disease on MRI. She has been seen by physical therapy/Occupational Therapy.   She is on Eliquis for anticoagulation. Neurology signed off.  PT and OT recommended SNF placement.  Patient awaiting insurance authorization for SNF placement. Vasculitis work-up is in process.  Needs outpatient follow-up.  Nausea >>> resolved Condition improved after giving Protonix.   No need for gastric  emptying study. Metformin and hydrochlorothiazide is discontinued.  Nonsustained ventricular tachycardia>> Resolved. Patient had 1 episode of 8 beats.  No additional episodes. Continue beta-blocker.  Echocardiogram showed normal ejection fraction.  Left second toe wound.  Bilateral foot pain. No need for surgery per podiatry.  Bilateral foot pain improving.  Type 2 diabetes.   Continue current regimen. Regular insulin sliding scale.   DVT prophylaxis: Eliquis Code Status: Full code. Family Communication:  No family at bed side. Disposition Plan:   Status is: Inpatient  Remains inpatient appropriate because:Inpatient level of care appropriate due to severity of illness   Dispo: The patient is from: Home              Anticipated d/c is to: SNF              Anticipated d/c date is: 01/23              Patient currently is not medically stable to d/c.   Consultants:    Neurology  Procedures: None Antimicrobials:  Anti-infectives (From admission, onward)   None     Subjective: Patient was seen and examined at bedside.  Overnight events noted.   Patient was lying comfortably on the bed, she still reports having left-sided weakness.   She is awaiting nursing home placement.  Objective: Vitals:   04/13/20 1954 04/14/20 0006 04/14/20 0352 04/14/20 0836  BP: 101/64 (!) 117/59 (!) 118/55 123/72  Pulse: (!) 58 (!) 59 (!) 59 (!) 56  Resp: 18 18 18 16   Temp: 98.1 F (36.7 C) 98.5 F (36.9 C) 97.9  F (36.6 C) 97.8 F (36.6 C)  TempSrc: Oral Oral Oral   SpO2: (!) 70% 97% 96%   Weight:      Height:        Intake/Output Summary (Last 24 hours) at 04/14/2020 1057 Last data filed at 04/14/2020 1010 Gross per 24 hour  Intake 457 ml  Output 850 ml  Net -393 ml   Filed Weights   04/08/20 1721  Weight: 111.3 kg    Examination:  General exam: Appears calm and comfortable, not in any acute distress Respiratory system: Clear to auscultation. Respiratory effort  normal. Cardiovascular system: S1 & S2 heard, RRR. No JVD, murmurs, rubs, gallops or clicks. No pedal edema. Gastrointestinal system: Abdomen is nondistended, soft and nontender. No organomegaly or masses felt. Normal bowel sounds heard. Central nervous system: Alert and oriented. No focal neurological deficits. Extremities: Left arm and left leg weakness. Sensations intact. Skin: No rashes, lesions or ulcers Psychiatry: Judgement and insight appear normal. Mood & affect appropriate.     Data Reviewed: I have personally reviewed following labs and imaging studies  CBC: Recent Labs  Lab 04/10/20 0605 04/13/20 0611  WBC 6.1 6.9  HGB 9.5* 10.3*  HCT 29.6* 33.1*  MCV 84.6 85.5  PLT 227 403   Basic Metabolic Panel: Recent Labs  Lab 04/10/20 0605 04/14/20 0623  NA 143 138  K 4.4 4.7  CL 104 101  CO2 30 25  GLUCOSE 111* 113*  BUN 19 25*  CREATININE 0.85 0.86  CALCIUM 9.1 8.8*  MG 1.8 2.1  PHOS  --  4.1   GFR: Estimated Creatinine Clearance (by C-G formula based on SCr of 0.86 mg/dL) Female: 75.6 mL/min Female: 92 mL/min Liver Function Tests: No results for input(s): AST, ALT, ALKPHOS, BILITOT, PROT, ALBUMIN in the last 168 hours. No results for input(s): LIPASE, AMYLASE in the last 168 hours. No results for input(s): AMMONIA in the last 168 hours. Coagulation Profile: No results for input(s): INR, PROTIME in the last 168 hours. Cardiac Enzymes: No results for input(s): CKTOTAL, CKMB, CKMBINDEX, TROPONINI in the last 168 hours. BNP (last 3 results) No results for input(s): PROBNP in the last 8760 hours. HbA1C: No results for input(s): HGBA1C in the last 72 hours. CBG: Recent Labs  Lab 04/13/20 1220 04/13/20 1653  GLUCAP 134* 143*   Lipid Profile: No results for input(s): CHOL, HDL, LDLCALC, TRIG, CHOLHDL, LDLDIRECT in the last 72 hours. Thyroid Function Tests: No results for input(s): TSH, T4TOTAL, FREET4, T3FREE, THYROIDAB in the last 72 hours. Anemia  Panel: No results for input(s): VITAMINB12, FOLATE, FERRITIN, TIBC, IRON, RETICCTPCT in the last 72 hours. Sepsis Labs: No results for input(s): PROCALCITON, LATICACIDVEN in the last 168 hours.  Recent Results (from the past 240 hour(s))  Resp Panel by RT-PCR (Flu A&B, Covid) Nasopharyngeal Swab     Status: None   Collection Time: 04/06/20  4:27 PM   Specimen: Nasopharyngeal Swab; Nasopharyngeal(NP) swabs in vial transport medium  Result Value Ref Range Status   SARS Coronavirus 2 by RT PCR NEGATIVE NEGATIVE Final    Comment: (NOTE) SARS-CoV-2 target nucleic acids are NOT DETECTED.  The SARS-CoV-2 RNA is generally detectable in upper respiratory specimens during the acute phase of infection. The lowest concentration of SARS-CoV-2 viral copies this assay can detect is 138 copies/mL. A negative result does not preclude SARS-Cov-2 infection and should not be used as the sole basis for treatment or other patient management decisions. A negative result may occur with  improper specimen  collection/handling, submission of specimen other than nasopharyngeal swab, presence of viral mutation(s) within the areas targeted by this assay, and inadequate number of viral copies(<138 copies/mL). A negative result must be combined with clinical observations, patient history, and epidemiological information. The expected result is Negative.  Fact Sheet for Patients:  EntrepreneurPulse.com.au  Fact Sheet for Healthcare Providers:  IncredibleEmployment.be  This test is no t yet approved or cleared by the [place] FDA and  has been authorized for detection and/or diagnosis of SARS-CoV-2 by FDA under an Emergency Use Authorization (EUA). This EUA will remain  in effect (meaning this test can be used) for the duration of the COVID-19 declaration under Section 564(b)(1) of the Act, 21 U.S.C.section 360bbb-3(b)(1), unless the authorization is terminated  or  revoked sooner.       Influenza A by PCR NEGATIVE NEGATIVE Final   Influenza B by PCR NEGATIVE NEGATIVE Final    Comment: (NOTE) The Xpert Xpress SARS-CoV-2/FLU/RSV plus assay is intended as an aid in the diagnosis of influenza from Nasopharyngeal swab specimens and should not be used as a sole basis for treatment. Nasal washings and aspirates are unacceptable for Xpert Xpress SARS-CoV-2/FLU/RSV testing.  Fact Sheet for Patients: EntrepreneurPulse.com.au  Fact Sheet for Healthcare Providers: IncredibleEmployment.be  This test is not yet approved or cleared by the [place] FDA and has been authorized for detection and/or diagnosis of SARS-CoV-2 by FDA under an Emergency Use Authorization (EUA). This EUA will remain in effect (meaning this test can be used) for the duration of the COVID-19 declaration under Section 564(b)(1) of the Act, 21 U.S.C. section 360bbb-3(b)(1), unless the authorization is terminated or revoked.  Performed at [facility], [address], [place], [place] [address]     Radiology Studies: No results found.  Scheduled Meds: .  stroke: mapping our early stages of recovery book   Does not apply Once  . amLODipine  5 mg Oral Daily  . apixaban  5 mg Oral BID  . vitamin C  250 mg Oral BID  . bacitracin   Topical Once  . buPROPion  150 mg Oral Daily  . cholecalciferol  1,000 Units Oral BID WC  . DULoxetine  60 mg Oral Daily  . feeding supplement  237 mL Oral Q24H  . fesoterodine  8 mg Oral Daily  . folic acid  1 mg Oral Daily  . ipratropium-albuterol  3 mL Nebulization BID  . levothyroxine  175 mcg Oral Q0600  . linagliptin  5 mg Oral Daily  . losartan  50 mg Oral Daily  . metoprolol succinate  50 mg Oral Daily  . mometasone-formoterol  2 puff Inhalation BID  . multivitamin with minerals  1 tablet Oral Daily  . oxybutynin  10 mg Oral QHS  . pantoprazole  40 mg Oral Daily  . rosuvastatin  20 mg Oral  Daily  . sucralfate  1 g Oral TID WC & HS  . thiamine  50 mg Oral BID WC  . cyanocobalamin  1,000 mcg Oral Daily  . Vitamin D (Ergocalciferol)  50,000 Units Oral Weekly   Continuous Infusions:   LOS: 7 days    Time spent: 25 mins    Shawna Clamp, MD [facility]   If 7PM-7AM, please contact night-coverage

## 2020-04-14 NOTE — Progress Notes (Signed)
L LE dressing change not completed 2/2 bacitracin ointment not available. Requested from pharmacy.

## 2020-04-15 DIAGNOSIS — I10 Essential (primary) hypertension: Secondary | ICD-10-CM | POA: Diagnosis not present

## 2020-04-15 DIAGNOSIS — Z7901 Long term (current) use of anticoagulants: Secondary | ICD-10-CM | POA: Diagnosis not present

## 2020-04-15 DIAGNOSIS — Z8673 Personal history of transient ischemic attack (TIA), and cerebral infarction without residual deficits: Secondary | ICD-10-CM | POA: Diagnosis not present

## 2020-04-15 DIAGNOSIS — E11621 Type 2 diabetes mellitus with foot ulcer: Secondary | ICD-10-CM | POA: Diagnosis not present

## 2020-04-15 DIAGNOSIS — G4733 Obstructive sleep apnea (adult) (pediatric): Secondary | ICD-10-CM | POA: Diagnosis not present

## 2020-04-15 DIAGNOSIS — R279 Unspecified lack of coordination: Secondary | ICD-10-CM | POA: Diagnosis not present

## 2020-04-15 DIAGNOSIS — E119 Type 2 diabetes mellitus without complications: Secondary | ICD-10-CM | POA: Diagnosis not present

## 2020-04-15 DIAGNOSIS — R5381 Other malaise: Secondary | ICD-10-CM | POA: Diagnosis not present

## 2020-04-15 DIAGNOSIS — L97529 Non-pressure chronic ulcer of other part of left foot with unspecified severity: Secondary | ICD-10-CM | POA: Diagnosis not present

## 2020-04-15 DIAGNOSIS — E1142 Type 2 diabetes mellitus with diabetic polyneuropathy: Secondary | ICD-10-CM | POA: Diagnosis not present

## 2020-04-15 DIAGNOSIS — Z7984 Long term (current) use of oral hypoglycemic drugs: Secondary | ICD-10-CM | POA: Diagnosis not present

## 2020-04-15 DIAGNOSIS — E1151 Type 2 diabetes mellitus with diabetic peripheral angiopathy without gangrene: Secondary | ICD-10-CM | POA: Diagnosis not present

## 2020-04-15 DIAGNOSIS — E1140 Type 2 diabetes mellitus with diabetic neuropathy, unspecified: Secondary | ICD-10-CM | POA: Diagnosis not present

## 2020-04-15 DIAGNOSIS — I482 Chronic atrial fibrillation, unspecified: Secondary | ICD-10-CM | POA: Diagnosis not present

## 2020-04-15 DIAGNOSIS — R69 Illness, unspecified: Secondary | ICD-10-CM | POA: Diagnosis not present

## 2020-04-15 DIAGNOSIS — J45909 Unspecified asthma, uncomplicated: Secondary | ICD-10-CM | POA: Diagnosis not present

## 2020-04-15 DIAGNOSIS — M6281 Muscle weakness (generalized): Secondary | ICD-10-CM | POA: Diagnosis not present

## 2020-04-15 DIAGNOSIS — Z6841 Body Mass Index (BMI) 40.0 and over, adult: Secondary | ICD-10-CM | POA: Diagnosis not present

## 2020-04-15 DIAGNOSIS — I69398 Other sequelae of cerebral infarction: Secondary | ICD-10-CM | POA: Diagnosis not present

## 2020-04-15 DIAGNOSIS — I480 Paroxysmal atrial fibrillation: Secondary | ICD-10-CM | POA: Diagnosis not present

## 2020-04-15 LAB — SARS CORONAVIRUS 2 BY RT PCR (HOSPITAL ORDER, PERFORMED IN ~~LOC~~ HOSPITAL LAB): SARS Coronavirus 2: NEGATIVE

## 2020-04-15 LAB — EPSTEIN BARR VRS(EBV DNA BY PCR)
EBV DNA QN by PCR: NEGATIVE copies/mL
log10 EBV DNA Qn PCR: UNDETERMINED log10 copy/mL

## 2020-04-15 MED ORDER — IPRATROPIUM-ALBUTEROL 0.5-2.5 (3) MG/3ML IN SOLN: 3.0000 mL | Freq: Four times a day (QID) | RESPIRATORY_TRACT | Status: DC | PRN

## 2020-04-15 NOTE — Discharge Instructions (Signed)
Advised to follow-up with primary care physician in 1 week.   Advised to follow-up with neurology in 3 to 4 weeks after discharge.

## 2020-04-15 NOTE — Progress Notes (Signed)
Patient OOF via EMS transport in stable condition to [facility].

## 2020-04-15 NOTE — Care Management Important Message (Signed)
Important Message  Patient Details  Name: [name] MRN: [id number] Date of Birth: 09/14/1950   Medicare Important Message Given:  Yes     [name] 04/15/2020, 11:20 AM

## 2020-04-15 NOTE — Discharge Summary (Addendum)
Physician Discharge Summary  [name] BTD:[id number] DOB: 1950-07-13 DOA: 04/06/2020  PCP: [name], Dawn Glow, MD  Admit date: 04/06/2020   Discharge date: 04/15/2020  Admitted From: Home.  Disposition:  SNF ( Skilled Nursing home)  Recommendations for Outpatient Follow-up:  1. Follow up with PCP in 1-2 weeks 2. Please obtain BMP/CBC in one week 3. Advised to follow-up with neurology in 3 to 4 weeks after discharge. 4. Advised to follow up vasculitis labs.   Home Health: None. Equipment/Devices: None.  Discharge Condition: Stable CODE STATUS:Full code Diet recommendation: Heart Healthy   Brief Summary / Hospital course: This 70 yrs old femalewith PMH significant forCVA, hypertension, diabetes mellitus type 2, morbid obesity, chronic pain syndrome, paroxysmal atrial fibrillation on Eliquis, previous DVT, OSA on CPAP, was sent from her primary care provider office for the evaluation of new stroke. Patient states that the she has left arm and left leg weakness for the last 2 or 3 months but she is very vague about her symptoms, she states that she might had worsening weakness 5 days ago. MRI of the brain showed several small infarctsin the right side of brain. Neurology was consulted, believes this is due to small vessel disease as most of the lesions changes are in the white matter. Neurology signed off.  Neurology recommended the etiology of her stroke is likely to be small vessel disease rather than cardioembolic.  Recommended to get vasculitis work-up and follow-up in the stroke clinic in 2 to 3 months.  PT and OT recommended Skilled nursing facility.  Patient had open wound in the left second toe.  Podiatry was consulted,  recommended no inpatient work-up needed,  recommended outpatient follow-up.  Patient had brief episode of ventricular tachycardia,  Echocardiogram was normal.  Patient was asymptomatic,  metoprolol was continued. Patient is being discharged to skilled  nursing facility for rehab.  She was managed for below problems.   Discharge Diagnoses:  Active Problems:   AF (paroxysmal atrial fibrillation) (HCC)   OSA (obstructive sleep apnea)   Diabetic peripheral neuropathy associated with type 2 diabetes mellitus (HCC)   Obesity, Class III, BMI 40-49.9 (morbid obesity) ([place])   Obstructive sleep apnea of adult   Diabetic ulcer of left foot ([place])   Acute embolic stroke ([place])   Stroke ([place])   Nonsustained ventricular tachycardia (HCC)  Left sided weakness could be secondary to CVA: Exact time of onset unknown.  History is vague. Patient does have  history of paroxysmal A. fib and stroke. Patient has been evaluated by neurology, initially suspect stroke, but neurology has ruled that out.   Presentation more consistent with  small vessel disease on MRI. She has been seen by physical therapy/Occupational Therapy.  She is on Eliquis for anticoagulation. Neurology signed off.  PT and OT recommended SNF placement.  Patient awaiting insurance authorization for SNF placement. Vasculitis work-up is in process.  Needs outpatient follow-up.  Nausea >>> resolved Condition improved after giving Protonix.  No need for gastric emptying study. Metformin and hydrochlorothiazide is discontinued.  Nonsustained ventricular tachycardia>> Resolved. Patient had 1 episode of 8 beats. No additional episodes. Continue beta-blocker. Echocardiogram showed normal ejection fraction.  Left second toe wound. Bilateral foot pain. No need for surgery per podiatry. Bilateral foot pain improving.  Type 2 diabetes.  Continue current regimen. Regular insulin sliding scale.  Discharge Instructions  Discharge Instructions    Call MD for:  difficulty breathing, headache or visual disturbances   Complete by: As directed  Call MD for:  persistant dizziness or light-headedness   Complete by: As directed    Call MD for:  persistant nausea and vomiting    Complete by: As directed    Call MD for:  redness, tenderness, or signs of infection (pain, swelling, redness, odor or green/yellow discharge around incision site)   Complete by: As directed    Call MD for:  temperature >100.4   Complete by: As directed    Diet - low sodium heart healthy   Complete by: As directed    Diet Carb Modified   Complete by: As directed    Discharge instructions   Complete by: As directed    Advised to follow-up with primary care physician in 1 week.   Advised to follow-up with neurology in 3 to 4 weeks after discharge.   Discharge wound care:   Complete by: As directed    Follow-up wound care at nursing home.   Increase activity slowly   Complete by: As directed      Allergies as of 04/15/2020      Reactions   Morphine And Related Other (See Comments), Nausea Only   Patient becomes very confused Other reaction(s): Other (See Comments), Other (See Comments) Patient becomes very confused Patient becomes very confused   Penicillins Hives, Shortness Of Breath, Swelling, Other (See Comments)   Facial swelling Has patient had a PCN reaction causing immediate rash, facial/tongue/throat swelling, SOB or lightheadedness with hypotension: Yes Has patient had a PCN reaction causing severe rash involving mucus membranes or skin necrosis: Yes Has patient had a PCN reaction that required hospitalization No Has patient had a PCN reaction occurring within the last 10 years: No If all of the above answers are "NO", then may proceed with Cephalosporin use. Other reaction(s): Other (See Comments), Other (See Comments) Facial swelling Has patient had a PCN reaction causing immediate rash, facial/tongue/throat swelling, SOB or lightheadedness with hypotension: Yes Has patient had a PCN reaction causing severe rash involving mucus membranes or skin necrosis: Yes Has patient had a PCN reaction that required hospitalization No Has patient had a PCN reaction occurring within  the last 10 years: No If all of the above answers are "NO", then may proceed with Cephalosporin use. Facial swelling Has patient had a PCN reaction causing immediate rash, facial/tongue/throat swelling, SOB or lightheadedness with hypotension: Yes Has patient had a PCN reaction causing severe rash involving mucus membranes or skin necrosis: Yes Has patient had a PCN reaction that required hospitalization No Has patient had a PCN reaction occurring within the last 10 years: No If all of the above answers are "NO", then may proceed with Cephalosporin use. Facial swelling Has patient had a PCN reaction causing immediate rash, facial/tongue/throat swelling, SOB or lightheadedness with hypotension: Yes Has patient had a PCN reaction causing severe rash involving mucus membranes or skin necrosis: Yes Has patient had a PCN reaction that required hospitalization No Has patient had a PCN reaction occurring within the last 10 years: No If all of the above answers are "NO", then may proceed with Cephalosporin use.   Ambien [zolpidem]    Hallucination    Aspirin Hives   Oxytetracycline Hives   Other reaction(s): Unknown      Medication List    TAKE these medications   acetaminophen 325 MG tablet Commonly known as: TYLENOL Take 2 tablets (650 mg total) by mouth every 6 (six) hours as needed for mild pain or moderate pain (or Fever >/= 101).  Advair HFA 115-21 MCG/ACT inhaler Generic drug: fluticasone-salmeterol Inhale 2 puffs into the lungs daily. Rinse mouth thoroughly after use   albuterol (2.5 MG/3ML) 0.083% nebulizer solution Commonly known as: PROVENTIL Take 2.5 mg by nebulization every 4 (four) hours as needed for wheezing or shortness of breath.   ALPRAZolam 0.5 MG tablet Commonly known as: XANAX Take 0.5 mg by mouth at bedtime as needed for anxiety or sleep.   amLODipine 5 MG tablet Commonly known as: NORVASC Take 1 tablet (5 mg total) by mouth daily.   apixaban 5 MG Tabs  tablet Commonly known as: ELIQUIS Take 1 tablet (5 mg total) by mouth 2 (two) times daily.   buPROPion 150 MG 24 hr tablet Commonly known as: WELLBUTRIN XL Take 1 tablet (150 mg total) by mouth daily.   calcium-vitamin D 500-200 MG-UNIT tablet Commonly known as: OSCAL WITH D Take 1 tablet by mouth 2 (two) times daily with a meal.   clobetasol cream 0.05 % Commonly known as: TEMOVATE Apply 1 application topically 2 (two) times daily as needed (skin irritation).   colchicine 0.6 MG tablet Take 0.6 mg by mouth 2 (two) times daily as needed (acute gout flare).   cyanocobalamin 1000 MCG tablet Take 1 tablet (1,000 mcg total) by mouth daily.   diclofenac Sodium 1 % Gel Commonly known as: VOLTAREN Apply 2 g topically 3 (three) times daily.   DULoxetine 60 MG capsule Commonly known as: CYMBALTA Take 1 capsule (60 mg total) by mouth daily.   ergocalciferol 1.25 MG (50000 UT) capsule Commonly known as: VITAMIN D2 Take 50,000 Units by mouth once a week.   estradiol 0.1 MG/GM vaginal cream Commonly known as: ESTRACE Place 1 Applicatorful vaginally every Monday, Wednesday, and Friday. Apply 0.5mg  (pea-sized amount)  just inside the vaginal introitus with a finger-tip on Monday, Wednesday and Friday nights,   folic acid 1 MG tablet Commonly known as: FOLVITE Take 1 tablet (1 mg total) by mouth daily.   HYDROcodone-acetaminophen 5-325 MG tablet Commonly known as: NORCO/VICODIN Take 1 tablet by mouth every 8 (eight) hours as needed for severe pain. Must last 30 days What changed: Another medication with the same name was removed. Continue taking this medication, and follow the directions you see here.   levocetirizine 5 MG tablet Commonly known as: XYZAL Take 0.5 tablets (2.5 mg total) by mouth every evening. What changed:   how much to take  when to take this  reasons to take this   levothyroxine 175 MCG tablet Commonly known as: SYNTHROID Take 1 tablet (175 mcg total) by  mouth daily before breakfast.   losartan-hydrochlorothiazide 50-12.5 MG tablet Commonly known as: HYZAAR Take 1 tablet by mouth daily.   metFORMIN 500 MG tablet Commonly known as: GLUCOPHAGE Take 1 tablet (500 mg total) by mouth daily with breakfast. With food   metoprolol succinate 50 MG 24 hr tablet Commonly known as: TOPROL-XL TAKE 1 TABLET BY MOUTH DAILY What changed:   how much to take  how to take this  when to take this  additional instructions   montelukast 10 MG tablet Commonly known as: SINGULAIR Take 1 tablet (10 mg total) by mouth at bedtime.   multivitamin with minerals Tabs tablet Take 1 tablet by mouth daily.   ondansetron 4 MG tablet Commonly known as: Zofran Take 1 tablet (4 mg total) by mouth every 8 (eight) hours as needed for nausea or vomiting.   oxybutynin 10 MG 24 hr tablet Commonly known as: DITROPAN-XL Take 10  mg by mouth at bedtime.   pantoprazole 40 MG tablet Commonly known as: PROTONIX Take 40 mg by mouth daily.   pregabalin 50 MG capsule Commonly known as: LYRICA Take 1 capsule (50 mg total) by mouth 3 (three) times daily.   rosuvastatin 20 MG tablet Commonly known as: CRESTOR Take 1 tablet (20 mg total) by mouth daily.   sitaGLIPtin 25 MG tablet Commonly known as: Januvia Take 1 tablet (25 mg total) by mouth daily.   sucralfate 1 GM/10ML suspension Commonly known as: CARAFATE Take 1 g by mouth 3 (three) times daily.   thiamine 100 MG tablet Take 0.5 tablets (50 mg total) by mouth 2 (two) times daily with a meal.   Toviaz 8 MG Tb24 tablet Generic drug: fesoterodine TAKE ONE TABLET BY MOUTH EVERY DAY What changed: how much to take   trospium 20 MG tablet Commonly known as: SANCTURA Take 20 mg by mouth 2 (two) times daily.   Tub Transfer Board Misc 1 Device by Does not apply route as needed.   Vitamin D3 25 MCG tablet Commonly known as: Vitamin D Take 1 tablet (1,000 Units total) by mouth 2 (two) times daily with a  meal.            Discharge Care Instructions  (From admission, onward)         Start     Ordered   04/15/20 0000  Discharge wound care:       Comments: Follow-up wound care at nursing home.   04/15/20 1047          Contact information for follow-up providers    [name], Dawn Glow, MD Follow up in 1 week(s).   Specialty: Internal Medicine Contact information: [address] [place] [address] [phone]        Wellington Hampshire, MD .   Specialty: Cardiology Contact information: [place] [address] [phone]        Garvin Fila, MD Follow up in 2 month(s).   Specialties: Neurology, Radiology Contact information: [address] [place] [address] [phone]            Contact information for after-discharge care    [facility] .   Service: Skilled Nursing Contact information: [place] [phone]                 Allergies  Allergen Reactions  . Morphine And Related Other (See Comments) and Nausea Only    Patient becomes very confused Other reaction(s): Other (See Comments), Other (See Comments) Patient becomes very confused Patient becomes very confused   . Penicillins Hives, Shortness Of Breath, Swelling and Other (See Comments)    Facial swelling Has patient had a PCN reaction causing immediate rash, facial/tongue/throat swelling, SOB or lightheadedness with hypotension: Yes Has patient had a PCN reaction causing severe rash involving mucus membranes or skin necrosis: Yes Has patient had a PCN reaction that required hospitalization No Has patient had a PCN reaction occurring within the last 10 years: No If all of the above answers are "NO", then may proceed with Cephalosporin use.  Other reaction(s): Other (See Comments), Other (See Comments) Facial swelling Has patient had a PCN  reaction causing immediate rash, facial/tongue/throat swelling, SOB or lightheadedness with hypotension: Yes Has patient had a PCN reaction causing severe rash involving mucus membranes or skin necrosis: Yes Has patient had a PCN reaction  that required hospitalization No Has patient had a PCN reaction occurring within the last 10 years: No If all of the above answers are "NO", then may proceed with Cephalosporin use. Facial swelling Has patient had a PCN reaction causing immediate rash, facial/tongue/throat swelling, SOB or lightheadedness with hypotension: Yes Has patient had a PCN reaction causing severe rash involving mucus membranes or skin necrosis: Yes Has patient had a PCN reaction that required hospitalization No Has patient had a PCN reaction occurring within the last 10 years: No If all of the above answers are "NO", then may proceed with Cephalosporin use. Facial swelling Has patient had a PCN reaction causing immediate rash, facial/tongue/throat swelling, SOB or lightheadedness with hypotension: Yes Has patient had a PCN reaction causing severe rash involving mucus membranes or skin necrosis: Yes Has patient had a PCN reaction that required hospitalization No Has patient had a PCN reaction occurring within the last 10 years: No If all of the above answers are "NO", then may proceed with Cephalosporin use.   . Ambien [Zolpidem]     Hallucination   . Aspirin Hives  . Oxytetracycline Hives    Other reaction(s): Unknown     Consultations:  Neurology   Procedures/Studies: DG Chest 2 View  Result Date: 04/05/2020 CLINICAL DATA:  Hypoxia. EXAM: CHEST - 2 VIEW COMPARISON:  02/05/2020 FINDINGS: Heart size remains within normal limits. Low lung volumes are again seen with stable elevation right hemidiaphragm and right lower lobe scarring. No evidence of pulmonary consolidation or pleural effusion. Right shoulder prosthesis again seen. IMPRESSION: Stable low lung volumes and right  lower lobe scarring. No acute findings. Electronically Signed   By: Marlaine Hind M.D.   On: 04/05/2020 12:06   MR ANGIO HEAD WO CONTRAST  Result Date: 04/08/2020 CLINICAL DATA:  70 year old female with lower extremity weakness, right basal ganglia lacunar infarct and posterior corpus callosum, periatrial infarcts on MRI 04/05/2020. EXAM: MRA HEAD WITHOUT CONTRAST TECHNIQUE: Angiographic images of the Circle of Willis were obtained using MRA technique without intravenous contrast. COMPARISON:  Head MRA 12/22/2019. FINDINGS: No intracranial mass effect or ventriculomegaly. Less motion artifact today compared to the October MRA. Stable antegrade flow in the posterior circulation with dominant appearing left vertebral artery. No distal vertebral stenosis. Patent vertebrobasilar junction. Patent basilar artery with mild to moderate irregularity but only mild stenosis which appears stable (series 1034, image 11). Patent SCA and PCA origins. Fetal type left PCA again noted. Right posterior communicating artery remains patent. Bilateral PCA branches are within normal limits. Stable antegrade flow in both ICA siphons. Probable artifact today at the right anterior genu, which was only mildly irregular in October, and supraclinoid flow signal appears stable. Left ICA siphon irregularity without stenosis. Carotid termini, MCA and ACA origins remain patent. Dominant left ACA as before. Anterior communicating artery and bilateral ACA branches are stable and within normal limits. MCA M1 segments and MCA bifurcations remain patent. Mild irregularity and stenosis of left MCA M2 branches is more apparent today but stable, including the posterior left M2 seen on series 1024, image 5. IMPRESSION: 1. Intracranial MRA is negative for large vessel occlusion and appears stable since October. 2. Suspect artifact at the right anterior genu today. There does appear to be mild atherosclerotic stenosis of the: - mid Basilar Artery. -  bilateral MCA M2 branches. Electronically Signed   By: Genevie Ann M.D.   On: 04/08/2020 10:09   MR Brain Wo Contrast  Result Date: 04/05/2020 CLINICAL DATA:  Stroke.  Bilateral leg weakness. EXAM: MRI HEAD WITHOUT CONTRAST TECHNIQUE: Multiplanar, multiecho pulse sequences of the brain and surrounding structures were obtained without intravenous contrast. COMPARISON:  02/05/2020 FINDINGS: Brain: Diffusion imaging shows a punctate focus of restricted diffusion in the right external capsule region consistent with acute small vessel infarction. There are a few smudgy areas of restricted diffusion in the right side of the splenium of the corpus callosum that could also represent small vessel infarctions. The differential diagnosis does include a demyelinating process. Elsewhere, chronic abnormal T2 and FLAIR signal within the deep and subcortical hemispheric white matter appears unchanged and most consistent with chronic small vessel disease. Coexistence demyelinating disease is not excluded. Right posterior parietal cortical and subcortical infarction is unchanged. Numerous dilated perivascular spaces throughout the basal ganglia regions. Few old small vessel insults also affect the thalami and brainstem. Previously seen acute infarctions have undergone expected evolutionary change with loss restricted diffusion. No evidence of mass, hemorrhage, hydrocephalus or extra-axial collection. Vascular: Major vessels at the base of the brain show flow. Skull and upper cervical spine: Negative Sinuses/Orbits: Clear/normal Other: None IMPRESSION: 1. Punctate focus of restricted diffusion in the right external capsule region consistent with acute small vessel infarction. Few smudgy areas of restricted diffusion in the right side of the splenium of the corpus callosum that could also represent small vessel infarctions. The differential diagnosis does include demyelinating disease, but that is felt less likely. 2. Chronic  small-vessel ischemic changes elsewhere throughout the brain as outlined above. Electronically Signed   By: Nelson Chimes M.D.   On: 04/05/2020 12:04   US Carotid Bilateral  Result Date: 04/08/2020 CLINICAL DATA:  Cerebral infarction, hypertension, hyperlipidemia and diabetes. EXAM: BILATERAL CAROTID DUPLEX ULTRASOUND TECHNIQUE: Pearline Cables scale imaging, color Doppler and duplex ultrasound were performed of bilateral carotid and vertebral arteries in the neck. COMPARISON:  None. FINDINGS: Criteria: Quantification of carotid stenosis is based on velocity parameters that correlate the residual internal carotid diameter with NASCET-based stenosis levels, using the diameter of the distal internal carotid lumen as the denominator for stenosis measurement. The following velocity measurements were obtained: RIGHT ICA:  58/16 cm/sec CCA:  [id number] cm/sec SYSTOLIC ICA/CCA RATIO:  0.9 ECA:  70 cm/sec LEFT ICA:  64/22 cm/sec CCA:  [id number] cm/sec SYSTOLIC ICA/CCA RATIO:  1.0 ECA:  53 cm/sec RIGHT CAROTID ARTERY: There is a mild amount of partially calcified plaque at the level of the distal bulb and proximal right ICA. Estimated right ICA stenosis is less than 50%. RIGHT VERTEBRAL ARTERY: Antegrade flow with normal waveform and velocity. LEFT CAROTID ARTERY: No focal plaque identified. No evidence of left carotid stenosis. LEFT VERTEBRAL ARTERY: Antegrade flow with normal waveform and velocity. IMPRESSION: 1. Mild plaque at the level of the right carotid bifurcation with estimated proximal right ICA stenosis of less than 50%. 2. No evidence of left-sided carotid plaque or stenosis in the neck. Electronically Signed   By: Aletta Edouard M.D.   On: 04/08/2020 11:02   DG Toe 2nd Left  Result Date: 04/05/2020 CLINICAL DATA:  Left second toe gangrene. EXAM: LEFT SECOND TOE COMPARISON:  December 19, 2019. FINDINGS: Stable old fractures are seen involving the second and third proximal phalanges as well as probable severe degenerative  changes seen involving the first metatarsophalangeal joint. No acute fracture or lytic destruction is noted. Soft tissues are unremarkable. IMPRESSION: No acute fracture or lytic destruction is noted to suggest osteomyelitis. Stable old fractures are seen involving the second and third proximal phalanges. Electronically Signed  By: Marijo Conception M.D.   On: 04/05/2020 14:49   ECHOCARDIOGRAM COMPLETE BUBBLE STUDY  Result Date: 04/07/2020    ECHOCARDIOGRAM REPORT   Patient Name:   [name] Date of Exam: 04/07/2020 Medical Rec #:  [id number]       Height:       65.0 in Accession #:    [id number]      Weight:       220.0 lb Date of Birth:  [birth year]       BSA:          2.060 m Patient Age:    23 years        BP:           121/66 mmHg Patient Gender: F               HR:           59 bpm. Exam Location:  [facility] Procedure: 2D Echo, Saline Contrast Bubble Study, Limited Color Doppler and            Cardiac Doppler Indications:     I63.9 Stroke  History:         Patient has prior history of Echocardiogram examinations, most                  recent 09/03/2019. PVD, Arrythmias:P afib; Risk Factors:Sleep                  Apnea, Dyslipidemia and Hypertension.  Sonographer:     Charmayne Sheer RDCS (AE) Referring Phys:  [id number] AMY N [facility] Diagnosing Phys: Kate Sable MD  Sonographer Comments: No apical window and no subcostal window. Image acquisition challenging due to patient body habitus. IMPRESSIONS  1. Left ventricular ejection fraction, by estimation, is 60 to 65%. The left ventricle has normal function. The left ventricle has no regional wall motion abnormalities. Left ventricular diastolic function could not be evaluated.  2. Right ventricular systolic function is normal. The right ventricular size is not well visualized.  3. The mitral valve is degenerative. Mild mitral valve regurgitation.  4. Tricuspid valve regurgitation is mild to moderate.  5. The aortic valve is tricuspid. Aortic valve regurgitation is not  visualized.  6. Agitated saline contrast bubble study was negative, with no evidence of any interatrial shunt. FINDINGS  Left Ventricle: Left ventricular ejection fraction, by estimation, is 60 to 65%. The left ventricle has normal function. The left ventricle has no regional wall motion abnormalities. The left ventricular internal cavity size was normal in size. There is  no left ventricular hypertrophy. Left ventricular diastolic function could not be evaluated. Right Ventricle: The right ventricular size is not well visualized. No increase in right ventricular wall thickness. Right ventricular systolic function is normal. Left Atrium: Left atrial size was normal in size. Right Atrium: Right atrial size was not well visualized. Pericardium: There is no evidence of pericardial effusion. Mitral Valve: The mitral valve is degenerative in appearance. Mild to moderate mitral annular calcification. Mild mitral valve regurgitation. Tricuspid Valve: The tricuspid valve is normal in structure. Tricuspid valve regurgitation is mild to moderate. Aortic Valve: The aortic valve is tricuspid. Aortic valve regurgitation is not visualized. Pulmonic Valve: The pulmonic valve was normal in structure. Pulmonic valve regurgitation is not visualized. Aorta: The aortic root and ascending aorta are structurally normal, with no evidence of dilitation. Venous: The inferior vena cava was not well visualized. IAS/Shunts: No atrial level shunt detected by color flow Doppler. Agitated  saline contrast was given intravenously to evaluate for intracardiac shunting. Agitated saline contrast bubble study was negative, with no evidence of any interatrial shunt.  LEFT VENTRICLE PLAX 2D LVIDd:         4.40 cm LVIDs:         2.60 cm LV PW:         1.20 cm LV IVS:        0.90 cm LVOT diam:     1.80 cm LVOT Area:     2.54 cm  LEFT ATRIUM         Index LA diam:    4.70 cm 2.28 cm/m                        PULMONIC VALVE AORTA                 PV Vmax:        1.21 m/s Ao Root diam: 3.00 cm PV Vmean:      84.100 cm/s                       PV VTI:        0.293 m                       PV Peak grad:  5.9 mmHg                       PV Mean grad:  3.0 mmHg  TRICUSPID VALVE TR Peak grad:   37.2 mmHg TR Vmax:        305.00 cm/s  SHUNTS Systemic Diam: 1.80 cm Kate Sable MD Electronically signed by Kate Sable MD Signature Date/Time: 04/07/2020/9:26:31 AM    Final    MRI brain, echocardiogram.   Subjective: Patient was seen and examined at bedside.  Overnight events noted.  Patient reports feeling much better,  patient wants to be discharged.  Patient is being discharged to skilled nursing facility.  Discharge Exam: Vitals:   04/15/20 0824 04/15/20 1131  BP: (!) 127/56 107/63  Pulse: (!) 59 60  Resp: 16 16  Temp: 97.6 F (36.4 C) 97.8 F (36.6 C)  SpO2: 90% 94%   Vitals:   04/15/20 0118 04/15/20 0435 04/15/20 0824 04/15/20 1131  BP: 130/65 (!) 149/69 (!) 127/56 107/63  Pulse: (!) 56 (!) 58 (!) 59 60  Resp: 18 16 16 16   Temp: (!) 97.5 F (36.4 C) (!) 97.5 F (36.4 C) 97.6 F (36.4 C) 97.8 F (36.6 C)  TempSrc: Oral Oral Oral   SpO2: 100% 98% 90% 94%  Weight:      Height:        General: Pt is alert, awake, not in acute distress Cardiovascular: RRR, S1/S2 +, no rubs, no gallops Respiratory: CTA bilaterally, no wheezing, no rhonchi Abdominal: Soft, NT, ND, bowel sounds + Extremities: no edema, no cyanosis. Neuro: Left-sided weakness.    The results of significant diagnostics from this hospitalization (including imaging, microbiology, ancillary and laboratory) are listed below for reference.     Microbiology: Recent Results (from the past 240 hour(s))  Resp Panel by RT-PCR (Flu A&B, Covid) Nasopharyngeal Swab     Status: None   Collection Time: 04/06/20  4:27 PM   Specimen: Nasopharyngeal Swab; Nasopharyngeal(NP) swabs in vial transport medium  Result Value Ref Range Status   SARS Coronavirus 2 by RT PCR NEGATIVE  NEGATIVE Final  Comment: (NOTE) SARS-CoV-2 target nucleic acids are NOT DETECTED.  The SARS-CoV-2 RNA is generally detectable in upper respiratory specimens during the acute phase of infection. The lowest concentration of SARS-CoV-2 viral copies this assay can detect is 138 copies/mL. A negative result does not preclude SARS-Cov-2 infection and should not be used as the sole basis for treatment or other patient management decisions. A negative result may occur with  improper specimen collection/handling, submission of specimen other than nasopharyngeal swab, presence of viral mutation(s) within the areas targeted by this assay, and inadequate number of viral copies(<138 copies/mL). A negative result must be combined with clinical observations, patient history, and epidemiological information. The expected result is Negative.  Fact Sheet for Patients:  EntrepreneurPulse.com.au  Fact Sheet for Healthcare Providers:  IncredibleEmployment.be  This test is no t yet approved or cleared by the [place] FDA and  has been authorized for detection and/or diagnosis of SARS-CoV-2 by FDA under an Emergency Use Authorization (EUA). This EUA will remain  in effect (meaning this test can be used) for the duration of the COVID-19 declaration under Section 564(b)(1) of the Act, 21 U.S.C.section 360bbb-3(b)(1), unless the authorization is terminated  or revoked sooner.       Influenza A by PCR NEGATIVE NEGATIVE Final   Influenza B by PCR NEGATIVE NEGATIVE Final    Comment: (NOTE) The Xpert Xpress SARS-CoV-2/FLU/RSV plus assay is intended as an aid in the diagnosis of influenza from Nasopharyngeal swab specimens and should not be used as a sole basis for treatment. Nasal washings and aspirates are unacceptable for Xpert Xpress SARS-CoV-2/FLU/RSV testing.  Fact Sheet for Patients: EntrepreneurPulse.com.au  Fact Sheet for Healthcare  Providers: IncredibleEmployment.be  This test is not yet approved or cleared by the [place] FDA and has been authorized for detection and/or diagnosis of SARS-CoV-2 by FDA under an Emergency Use Authorization (EUA). This EUA will remain in effect (meaning this test can be used) for the duration of the COVID-19 declaration under Section 564(b)(1) of the Act, 21 U.S.C. section 360bbb-3(b)(1), unless the authorization is terminated or revoked.  Performed at [facility], [address] [place], [place] [address]   SARS Coronavirus 2 by RT PCR (hospital order, performed in [place] hospital lab)     Status: None   Collection Time: 04/15/20 10:45 AM  Result Value Ref Range Status   SARS Coronavirus 2 NEGATIVE NEGATIVE Final    Comment: (NOTE) SARS-CoV-2 target nucleic acids are NOT DETECTED.  The SARS-CoV-2 RNA is generally detectable in upper and lower respiratory specimens during the acute phase of infection. The lowest concentration of SARS-CoV-2 viral copies this assay can detect is 250 copies / mL. A negative result does not preclude SARS-CoV-2 infection and should not be used as the sole basis for treatment or other patient management decisions.  A negative result may occur with improper specimen collection / handling, submission of specimen other than nasopharyngeal swab, presence of viral mutation(s) within the areas targeted by this assay, and inadequate number of viral copies (<250 copies / mL). A negative result must be combined with clinical observations, patient history, and epidemiological information.  Fact Sheet for Patients:   StrictlyIdeas.no  Fact Sheet for Healthcare Providers: BankingDealers.co.za  This test is not yet approved or  cleared by the [place] FDA and has been authorized for detection and/or diagnosis of SARS-CoV-2 by FDA under an Emergency Use Authorization  (EUA).  This EUA will remain in effect (meaning this test can be used) for  the duration of the COVID-19 declaration under Section 564(b)(1) of the Act, 21 U.S.C. section 360bbb-3(b)(1), unless the authorization is terminated or revoked sooner.  Performed at [facility], [address] [place], [place] [address]      Labs: BNP (last 3 results) Recent Labs    09/02/19 1044  BNP 123XX123*   Basic Metabolic Panel: Recent Labs  Lab 04/10/20 0605 04/14/20 0623  NA 143 138  K 4.4 4.7  CL 104 101  CO2 30 25  GLUCOSE 111* 113*  BUN 19 25*  CREATININE 0.85 0.86  CALCIUM 9.1 8.8*  MG 1.8 2.1  PHOS  --  4.1   Liver Function Tests: No results for input(s): AST, ALT, ALKPHOS, BILITOT, PROT, ALBUMIN in the last 168 hours. No results for input(s): LIPASE, AMYLASE in the last 168 hours. No results for input(s): AMMONIA in the last 168 hours. CBC: Recent Labs  Lab 04/10/20 0605 04/13/20 0611  WBC 6.1 6.9  HGB 9.5* 10.3*  HCT 29.6* 33.1*  MCV 84.6 85.5  PLT 227 227   Cardiac Enzymes: No results for input(s): CKTOTAL, CKMB, CKMBINDEX, TROPONINI in the last 168 hours. BNP: Invalid input(s): POCBNP CBG: Recent Labs  Lab 04/13/20 1220 04/13/20 1653  GLUCAP 134* 143*   D-Dimer No results for input(s): DDIMER in the last 72 hours. Hgb A1c No results for input(s): HGBA1C in the last 72 hours. Lipid Profile No results for input(s): CHOL, HDL, LDLCALC, TRIG, CHOLHDL, LDLDIRECT in the last 72 hours. Thyroid function studies No results for input(s): TSH, T4TOTAL, T3FREE, THYROIDAB in the last 72 hours.  Invalid input(s): FREET3 Anemia work up No results for input(s): VITAMINB12, FOLATE, FERRITIN, TIBC, IRON, RETICCTPCT in the last 72 hours. Urinalysis    Component Value Date/Time   COLORURINE YELLOW (A) 04/07/2020 1015   APPEARANCEUR CLEAR (A) 04/07/2020 1015   APPEARANCEUR Clear 09/18/2019 1437   LABSPEC 1.008 04/07/2020 1015   PHURINE 5.0 04/07/2020 1015    GLUCOSEU NEGATIVE 04/07/2020 1015   GLUCOSEU NEGATIVE 08/02/2017 1427   HGBUR NEGATIVE 04/07/2020 [place] 04/07/2020 1015   BILIRUBINUR Negative 09/18/2019 1437   BILIRUBINUR 1+ 09/15/2017 1319   KETONESUR NEGATIVE 04/07/2020 1015   PROTEINUR NEGATIVE 04/07/2020 1015   UROBILINOGEN 0.2 09/15/2017 1319   UROBILINOGEN 0.2 08/02/2017 1427   NITRITE NEGATIVE 04/07/2020 1015   LEUKOCYTESUR NEGATIVE 04/07/2020 1015   Sepsis Labs Invalid input(s): PROCALCITONIN,  WBC,  LACTICIDVEN Microbiology Recent Results (from the past 240 hour(s))  Resp Panel by RT-PCR (Flu A&B, Covid) Nasopharyngeal Swab     Status: None   Collection Time: 04/06/20  4:27 PM   Specimen: Nasopharyngeal Swab; Nasopharyngeal(NP) swabs in vial transport medium  Result Value Ref Range Status   SARS Coronavirus 2 by RT PCR NEGATIVE NEGATIVE Final    Comment: (NOTE) SARS-CoV-2 target nucleic acids are NOT DETECTED.  The SARS-CoV-2 RNA is generally detectable in upper respiratory specimens during the acute phase of infection. The lowest concentration of SARS-CoV-2 viral copies this assay can detect is 138 copies/mL. A negative result does not preclude SARS-Cov-2 infection and should not be used as the sole basis for treatment or other patient management decisions. A negative result may occur with  improper specimen collection/handling, submission of specimen other than nasopharyngeal swab, presence of viral mutation(s) within the areas targeted by this assay, and inadequate number of viral copies(<138 copies/mL). A negative result must be combined with clinical observations, patient history, and epidemiological information. The expected result is Negative.  Fact Sheet for Patients:  EntrepreneurPulse.com.au  Fact Sheet for Healthcare Providers:  IncredibleEmployment.be  This test is no t yet approved or cleared by the [place] FDA and  has been authorized  for detection and/or diagnosis of SARS-CoV-2 by FDA under an Emergency Use Authorization (EUA). This EUA will remain  in effect (meaning this test can be used) for the duration of the COVID-19 declaration under Section 564(b)(1) of the Act, 21 U.S.C.section 360bbb-3(b)(1), unless the authorization is terminated  or revoked sooner.       Influenza A by PCR NEGATIVE NEGATIVE Final   Influenza B by PCR NEGATIVE NEGATIVE Final    Comment: (NOTE) The Xpert Xpress SARS-CoV-2/FLU/RSV plus assay is intended as an aid in the diagnosis of influenza from Nasopharyngeal swab specimens and should not be used as a sole basis for treatment. Nasal washings and aspirates are unacceptable for Xpert Xpress SARS-CoV-2/FLU/RSV testing.  Fact Sheet for Patients: EntrepreneurPulse.com.au  Fact Sheet for Healthcare Providers: IncredibleEmployment.be  This test is not yet approved or cleared by the [place] FDA and has been authorized for detection and/or diagnosis of SARS-CoV-2 by FDA under an Emergency Use Authorization (EUA). This EUA will remain in effect (meaning this test can be used) for the duration of the COVID-19 declaration under Section 564(b)(1) of the Act, 21 U.S.C. section 360bbb-3(b)(1), unless the authorization is terminated or revoked.  Performed at [facility], [address] [place], [place] [address]   SARS Coronavirus 2 by RT PCR (hospital order, performed in [place] hospital lab)     Status: None   Collection Time: 04/15/20 10:45 AM  Result Value Ref Range Status   SARS Coronavirus 2 NEGATIVE NEGATIVE Final    Comment: (NOTE) SARS-CoV-2 target nucleic acids are NOT DETECTED.  The SARS-CoV-2 RNA is generally detectable in upper and lower respiratory specimens during the acute phase of infection. The lowest concentration of SARS-CoV-2 viral copies this assay can detect is 250 copies / mL. A negative result does not  preclude SARS-CoV-2 infection and should not be used as the sole basis for treatment or other patient management decisions.  A negative result may occur with improper specimen collection / handling, submission of specimen other than nasopharyngeal swab, presence of viral mutation(s) within the areas targeted by this assay, and inadequate number of viral copies (<250 copies / mL). A negative result must be combined with clinical observations, patient history, and epidemiological information.  Fact Sheet for Patients:   StrictlyIdeas.no  Fact Sheet for Healthcare Providers: BankingDealers.co.za  This test is not yet approved or  cleared by the [place] FDA and has been authorized for detection and/or diagnosis of SARS-CoV-2 by FDA under an Emergency Use Authorization (EUA).  This EUA will remain in effect (meaning this test can be used) for the duration of the COVID-19 declaration under Section 564(b)(1) of the Act, 21 U.S.C. section 360bbb-3(b)(1), unless the authorization is terminated or revoked sooner.  Performed at [facility], [address] [place], [place] [address]      Time coordinating discharge: Over 30 minutes  SIGNED:   Shawna Clamp, MD  [facility] 04/15/2020, 12:26 PM Pager   If 7PM-7AM, please contact night-coverage [email or web address]

## 2020-04-16 DIAGNOSIS — I482 Chronic atrial fibrillation, unspecified: Secondary | ICD-10-CM | POA: Diagnosis not present

## 2020-04-16 DIAGNOSIS — E1142 Type 2 diabetes mellitus with diabetic polyneuropathy: Secondary | ICD-10-CM | POA: Diagnosis not present

## 2020-04-16 DIAGNOSIS — E11621 Type 2 diabetes mellitus with foot ulcer: Secondary | ICD-10-CM | POA: Diagnosis not present

## 2020-04-16 DIAGNOSIS — R69 Illness, unspecified: Secondary | ICD-10-CM | POA: Diagnosis not present

## 2020-04-16 DIAGNOSIS — L97529 Non-pressure chronic ulcer of other part of left foot with unspecified severity: Secondary | ICD-10-CM | POA: Diagnosis not present

## 2020-04-16 DIAGNOSIS — Z6841 Body Mass Index (BMI) 40.0 and over, adult: Secondary | ICD-10-CM | POA: Diagnosis not present

## 2020-04-16 LAB — MISC LABCORP TEST (SEND OUT): Labcorp test code: 520293

## 2020-04-18 ENCOUNTER — Encounter: Payer: Self-pay | Admitting: Internal Medicine

## 2020-04-18 ENCOUNTER — Telehealth: Payer: Self-pay | Admitting: Internal Medicine

## 2020-04-18 DIAGNOSIS — R768 Other specified abnormal immunological findings in serum: Secondary | ICD-10-CM | POA: Insufficient documentation

## 2020-04-18 NOTE — Addendum Note (Signed)
Addended by: Orland Mustard on: 04/18/2020 06:41 PM   Modules accepted: Orders

## 2020-04-18 NOTE — Telephone Encounter (Signed)
Spoke with Patient's husband. States that since being at the facility and leaving the hospital Patient has been without her pain medication. States that they said the medication truck was delayed and that the truck came in last night. Informed the husband that if her medication was not delivered today then to contact Milinda Pointer, MD to send the medication to the facility pharmacy.   Informed the Patient's husband of the below and he states the Patient is agreeable to referral.   Also verified appointment for 2/1 at 1:30 pm. Patient will be transported by the facility.

## 2020-04-18 NOTE — Telephone Encounter (Signed)
Please mail FL-2 and snap shot paperwork to SNF pt is located  Also write on there pt will need rheumatology f/u referred [facility] rheumatology

## 2020-04-18 NOTE — Progress Notes (Addendum)
The patient's vasculitis panel came back with mildly elevated titers on the ANA panel: Homogeneous pattern: 1:160 Nucleolar pattern: 1:80 Speckled pattern: 1:160  These values are not high enough to strongly militate in favor of CNS lupus, but she should be scheduled for an assessment with Rheumatology.   I have notified her PCP Dr. [name].   Electronically signed: Dr. Kerney Elbe

## 2020-04-18 NOTE — Telephone Encounter (Signed)
Per neurology call husband  She will also need referral to rheumatology  Is she agreeable [facility] rheumatology?   Where is she so we can fax other paperwork or mail to facility and I need to add rheumatology   See below and read to him Hi [name],    This is Dr. Cheral Marker, the Neurologist who saw [name] during her recent admission to [facility] for stroke symptoms. Her MRI had shown small foci of ischemic infarction, most likely secondary to small vessel disease. Given recurrent strokes while on anticoagulation, I obtained a basic vasculitis panel. The values came back negative except for the weakly positive ANA panel that I describe in the note forwarded to you. Overall impression is that the values are not high enough to militate strongly in favor of CNS lupus, but she should follow up with a Rheumatologist for further assessment.   Thank you,   Dr. Cheral Marker

## 2020-04-18 NOTE — Telephone Encounter (Signed)
Pt's husband would like a call regarding her medications that was not given to his wife at the hospital. He also said someone called him and he was returning the call. I told him that it might have been the automated call for pt's appt next week. [name] is asking for you to call him on his cell.

## 2020-04-19 ENCOUNTER — Telehealth: Payer: Self-pay | Admitting: Internal Medicine

## 2020-04-19 NOTE — Telephone Encounter (Signed)
Rejection Reason - Other - Not able to accommodate patient on a stretcher. Will need to follow up with the [place]." Unknown User said on Apr 19, 2020 11:44 AM  "Pt's husband stated he would call to set up an appt once he could arrange transportation for the patient due to her being non weight bearing. " Unknown User said on Apr 16, 2020 9:31 AM  Podiatry

## 2020-04-23 ENCOUNTER — Inpatient Hospital Stay: Payer: Medicare HMO | Admitting: Internal Medicine

## 2020-04-25 ENCOUNTER — Telehealth: Payer: Self-pay

## 2020-04-25 NOTE — Telephone Encounter (Signed)
Faxed FL2 forms and notes/problems list, etc to [facility] to [phone] @ 04/25/20

## 2020-04-26 NOTE — Telephone Encounter (Signed)
Faxed to [employer]

## 2020-04-29 ENCOUNTER — Telehealth: Payer: Self-pay | Admitting: Family

## 2020-04-29 NOTE — Telephone Encounter (Signed)
[name] is presently at [facility]. Called to make them aware of her upcoming appointment 05/04/2019 at 1:30pm. Left voicemail with secretary as nursing staff was unavailable.   When nursing staff calls back, need to confirm time/date of appointment with them as well as that transportation has been arranged. Likely will also need to confirm that they have the address of our office.   Loel Dubonnet, NP

## 2020-04-30 ENCOUNTER — Telehealth: Payer: Self-pay | Admitting: Internal Medicine

## 2020-04-30 NOTE — Telephone Encounter (Signed)
Patient husband calling in to inquire about [facility] forms. This was completed in December and scanned. Printed this off for the Patient's husband and will place this up front for him to pick up tomorrow.   Patient's husband states that the rehab center wants to send the Patient home Saturday. He states they have not made the official decision yet. He states she is not ready to come home yet as she can not stand without the machine taking her out of bed. He states he can not care for her himself while she is in this state.   They are currently seeing about extending her stay. Patient's husband states he does not know if Dr Olivia Mackie [name] could do anything to help extend her stay?   Please advise

## 2020-04-30 NOTE — Telephone Encounter (Signed)
Can you fax correspondence to facility attn Dr. Frazier Richards husband does not feel like pt ready for discharge 2/12 (Saturday) I think she may need long term stay

## 2020-05-01 NOTE — Telephone Encounter (Signed)
Faxed attention Dr Ouida Sills to [facility] via [facility] routing.

## 2020-05-03 ENCOUNTER — Encounter: Payer: Self-pay | Admitting: Family

## 2020-05-03 ENCOUNTER — Ambulatory Visit: Payer: Medicare HMO | Admitting: Family

## 2020-05-03 ENCOUNTER — Other Ambulatory Visit: Payer: Self-pay

## 2020-05-03 VITALS — BP 106/60 | HR 58 | Ht 65.0 in

## 2020-05-03 DIAGNOSIS — Z8673 Personal history of transient ischemic attack (TIA), and cerebral infarction without residual deficits: Secondary | ICD-10-CM | POA: Diagnosis not present

## 2020-05-03 DIAGNOSIS — G4733 Obstructive sleep apnea (adult) (pediatric): Secondary | ICD-10-CM | POA: Diagnosis not present

## 2020-05-03 DIAGNOSIS — I10 Essential (primary) hypertension: Secondary | ICD-10-CM

## 2020-05-03 DIAGNOSIS — I480 Paroxysmal atrial fibrillation: Secondary | ICD-10-CM | POA: Diagnosis not present

## 2020-05-03 DIAGNOSIS — E119 Type 2 diabetes mellitus without complications: Secondary | ICD-10-CM

## 2020-05-03 DIAGNOSIS — Z7901 Long term (current) use of anticoagulants: Secondary | ICD-10-CM | POA: Diagnosis not present

## 2020-05-03 NOTE — Patient Instructions (Addendum)
Medication Instructions:  Your physician has recommended you make the following change in your medication:  STOP Amlodipine  *If you need a refill on your cardiac medications before your next appointment, please call your pharmacy*  Lab Work: None ordered today  Testing/Procedures: None ordered today.   Follow-Up: At [facility], you and your health needs are our priority.  As part of our continuing mission to provide you with exceptional heart care, we have created designated Provider Care Teams.  These Care Teams include your primary Cardiologist (physician) and Advanced Practice Providers (APPs -  Physician Assistants and Nurse Practitioners) who all work together to provide you with the care you need, when you need it.  We recommend signing up for the patient portal called "MyChart".  Sign up information is provided on this After Visit Summary.  MyChart is used to connect with patients for Virtual Visits (Telemedicine).  Patients are able to view lab/test results, encounter notes, upcoming appointments, etc.  Non-urgent messages can be sent to your provider as well.   To learn more about what you can do with MyChart, go to [email or web address].    Your next appointment:   3 month(s)  The format for your next appointment:   In Person  Provider:   You may see Kathlyn Sacramento, MD or one of the following Advanced Practice Providers on your designated Care Team:    Murray Hodgkins, NP  Christell Faith, PA-C  Marrianne Mood, PA-C  Cadence Kathlen Mody, [place]  Laurann [place], NP  Other Instructions Recommend talking to Vascular as well as Rheumatology about your leg weakness.

## 2020-05-03 NOTE — Progress Notes (Signed)
Office Visit    Patient Name: [name] Date of Encounter: 05/03/2020  Primary Care Provider:  [name], [name], MD Primary Cardiologist:  Kathlyn Sacramento, MD Electrophysiologist:  None   Chief Complaint    [name] is a 70 y.o. adult with a hx of CVA, HTN, DM2, morbid obesity, chronic pain, PAF, previous DVT, OSA on CPAP, obesity presents today for follow-up of HTN, PAF.  Past Medical History    Past Medical History:  Diagnosis Date  . Abnormal antibody titer   . Anxiety and depression   . Arthritis   . Asthma   . Cystocele   . Depression   . Diabetes ([place])   . DVT (deep venous thrombosis) (HCC)    Righ calf  . Dyspnea    11/08/2018 - "more now due to lack of exercise"  . Dysrhythmia    afib  . Edema   . GERD (gastroesophageal reflux disease)   . Heart murmur    Echocardiogram June 2015: Mild MR (possible vegetation seen on TTE, not seen on TEE), normal LV size with moderate concentric LVH. Normal function EF 60-65%. Normal diastolic function. Mild LA dilation.  Marland Kitchen History of IBS   . History of kidney stones   . History of methicillin resistant staphylococcus aureus (MRSA) 2008  . Hyperlipidemia   . Hypertension   . Hypothyroidism   . Insomnia   . Kidney stone    kidney stones with lithotripsy .  [facility]- "adrenal glands"  . Neuropathy involving both lower extremities   . Obesity, Class II, BMI 35-39.9, with comorbidity   . Paroxysmal atrial fibrillation ([place]) 2007   In June 2015, Cardiac Event Monitor: Mostly SR/sinus arrhythmia with PVCs that are frequent. Short bursts of A. fib lasting several minutes;; CHA2DS2-VASc Score = 5 (age, Female, PVD, DM, HTN)  . Peripheral vascular disease ([place])   . Sleep apnea    use C-PAP  . Urinary incontinence   . Venous stasis dermatitis of both lower extremities    Past Surgical History:  Procedure Laterality Date  . ANKLE SURGERY Right   . APPLICATION OF WOUND VAC Left 06/27/2015   Procedure:  APPLICATION OF WOUND VAC ( POSSIBLE ) ;  Surgeon: [name], MD;  Location: [facility] ORS;  Service: Vascular;  Laterality: Left;  . APPLICATION OF WOUND VAC Left 09/25/2016   Procedure: APPLICATION OF WOUND VAC;  Surgeon: [name], DPM;  Location: [facility];  Service: Podiatry;  Laterality: Left;  . arm surgery     right    . CHOLECYSTECTOMY    . COLONOSCOPY    . COLONOSCOPY WITH PROPOFOL N/A 01/11/2017   Procedure: COLONOSCOPY WITH PROPOFOL;  Surgeon: [name], MD;  Location: [facility] ENDOSCOPY;  Service: Endoscopy;  Laterality: N/A;  . COLONOSCOPY WITH PROPOFOL N/A 02/22/2017   Procedure: COLONOSCOPY WITH PROPOFOL;  Surgeon: [name], MD;  Location: [facility] ENDOSCOPY;  Service: Endoscopy;  Laterality: N/A;  . COLONOSCOPY WITH PROPOFOL N/A 05/31/2017   Procedure: COLONOSCOPY WITH PROPOFOL;  Surgeon: [name], MD;  Location: [facility] ENDOSCOPY;  Service: Endoscopy;  Laterality: N/A;  . ESOPHAGOGASTRODUODENOSCOPY (EGD) WITH PROPOFOL N/A 01/11/2017   Procedure: ESOPHAGOGASTRODUODENOSCOPY (EGD) WITH PROPOFOL;  Surgeon: [name], MD;  Location: [facility] ENDOSCOPY;  Service: Endoscopy;  Laterality: N/A;  . ESOPHAGOGASTRODUODENOSCOPY (EGD) WITH PROPOFOL N/A 10/25/2018   Procedure: ESOPHAGOGASTRODUODENOSCOPY (EGD) WITH PROPOFOL;  Surgeon: [name], MD;  Location: [facility] ENDOSCOPY;  Service: Endoscopy;  Laterality: N/A;  .  ESOPHAGOGASTRODUODENOSCOPY (EGD) WITH PROPOFOL N/A 11/29/2019   Procedure: ESOPHAGOGASTRODUODENOSCOPY (EGD) WITH PROPOFOL;  Surgeon: Toledo, Benay Pike, MD;  Location: [facility] ENDOSCOPY;  Service: Gastroenterology;  Laterality: N/A;  . HERNIA REPAIR     umbilical  . HIATAL HERNIA REPAIR    . I & D EXTREMITY Left 06/27/2015   Procedure: IRRIGATION AND DEBRIDEMENT EXTREMITY            ( CALF HEMATOMA ) POSSIBLE WOUND VAC;  Surgeon: [name], MD;  Location: [facility] ORS;  Service: Vascular;  Laterality: Left;  . INCISION AND DRAINAGE OF WOUND Left 09/25/2016    Procedure: IRRIGATION AND DEBRIDEMENT - PARTIAL RESECTION OF ACHILLES TENDON WITH WOUND VAC APPLICATION;  Surgeon: [name], DPM;  Location: [facility] ORS;  Service: Podiatry;  Laterality: Left;  . INCISION AND DRAINAGE OF WOUND Left 11/09/2018   Procedure: Excision of left foot wound with ACell placement;  Surgeon: Wallace Going, DO;  Location: [facility];  Service: Plastics;  Laterality: Left;  . IRRIGATION AND DEBRIDEMENT ABSCESS Left 09/07/2016   Procedure: IRRIGATION AND DEBRIDEMENT ABSCESS LEFT HEEL;  Surgeon: [name], DPM;  Location: [facility];  Service: Podiatry;  Laterality: Left;  . JOINT REPLACEMENT  2013   left knee replacement  . KIDNEY SURGERY Right    kidney stones  . LITHOTRIPSY    . LOWER EXTREMITY ANGIOGRAPHY Left 04/04/2018   Procedure: LOWER EXTREMITY ANGIOGRAPHY;  Surgeon: [name], MD;  Location: [facility];  Service: Cardiovascular;  Laterality: Left;  . NM GATED MYOVIEW ([place] HX)  February 2017   Likely breast attenuation. LOW RISK study. Normal EF 55-60%.  . OTHER SURGICAL HISTORY     08/2016 or 09/2016 surgery on achilles tenso h/o staph infection heal. Dr. [name]  . removal of left hematoma Left    leg  . REVERSE SHOULDER ARTHROPLASTY Right 10/08/2015   Procedure: REVERSE SHOULDER ARTHROPLASTY;  Surgeon: [name], MD;  Location: [facility] OR[facility];  Service: Orthopedics;  Laterality: Right;  . SLEEVE GASTROPLASTY    . TONSILLECTOMY    . TOTAL KNEE ARTHROPLASTY Left   . TOTAL SHOULDER REPLACEMENT Right   . TRANSESOPHAGEAL ECHOCARDIOGRAM  08/08/2013   Mild LVH, EF 60-65%. Moderate LA dilation and mild RA dilation. Mild MR with no evidence of stenosis and no evidence of endocarditis. A false chordae is noted.  . TRANSTHORACIC ECHOCARDIOGRAM  08/03/2013   Mild-Moderate concentric LVH, EF 60-65%. Normal diastolic function. Mild LA dilation. Mild MR with possible vegetation  - not confirmed on TEE   . ULNAR NERVE TRANSPOSITION Right 08/06/2016    Procedure: ULNAR NERVE DECOMPRESSION/TRANSPOSITION;  Surgeon: [name], MD;  Location: [facility];  Service: Orthopedics;  Laterality: Right;  . UPPER GI ENDOSCOPY    . VAGINAL HYSTERECTOMY      Allergies  Allergies  Allergen Reactions  . Morphine And Related Other (See Comments) and Nausea Only    Patient becomes very confused Other reaction(s): Other (See Comments), Other (See Comments) Patient becomes very confused Patient becomes very confused   . Penicillins Hives, Shortness Of Breath, Swelling and Other (See Comments)    Facial swelling Has patient had a PCN reaction causing immediate rash, facial/tongue/throat swelling, SOB or lightheadedness with hypotension: Yes Has patient had a PCN reaction causing severe rash involving mucus membranes or skin necrosis: Yes Has patient had a PCN reaction that required hospitalization No Has patient had a PCN reaction occurring within the last 10 years: No If all of the above  answers are "NO", then may proceed with Cephalosporin use.  Other reaction(s): Other (See Comments), Other (See Comments) Facial swelling Has patient had a PCN reaction causing immediate rash, facial/tongue/throat swelling, SOB or lightheadedness with hypotension: Yes Has patient had a PCN reaction causing severe rash involving mucus membranes or skin necrosis: Yes Has patient had a PCN reaction that required hospitalization No Has patient had a PCN reaction occurring within the last 10 years: No If all of the above answers are "NO", then may proceed with Cephalosporin use. Facial swelling Has patient had a PCN reaction causing immediate rash, facial/tongue/throat swelling, SOB or lightheadedness with hypotension: Yes Has patient had a PCN reaction causing severe rash involving mucus membranes or skin necrosis: Yes Has patient had a PCN reaction that required hospitalization No Has patient had a PCN reaction occurring within the last 10 years: No If all of the  above answers are "NO", then may proceed with Cephalosporin use. Facial swelling Has patient had a PCN reaction causing immediate rash, facial/tongue/throat swelling, SOB or lightheadedness with hypotension: Yes Has patient had a PCN reaction causing severe rash involving mucus membranes or skin necrosis: Yes Has patient had a PCN reaction that required hospitalization No Has patient had a PCN reaction occurring within the last 10 years: No If all of the above answers are "NO", then may proceed with Cephalosporin use.   . Ambien [Zolpidem]     Hallucination   . Aspirin Hives  . Oxytetracycline Hives    Other reaction(s): Unknown    History of Present Illness    [name] is a 70 y.o. adult with a hx of CVA, HTN, DM2, morbid obesity, chronic pain, PAF, previous DVT, OSA on CPAP, obesity last seen 01/30/20.  She follows with Dr. Lucky Cowboy for chronic left foot ulceration.  There was no evidence of PAD on noninvasive Doppler as well as angiogram done in 2020.  She had debridement with subsequent improvement.   She had atypical chest pain in the past and had Lexiscan Myoview 03/2018 was low risk with no evidence of ischemia.  Her PAF has been well controlled on Toprol 50 daily, she had bradycardia on higher doses.  She is anticoagulated with Pradaxa.   She was admitted June 2021 with pneumonia and sepsis.  She had minimally elevated troponins of 120, 170, 143 which were asymptomatic and was presumed to be due to demand ischemia. Echocardiogram while admitted with EF 60 to 65%, no RWMA, RV normal size and function, no significant valvular abnormalities.  Called the office 10/30/2019 noting similar symptoms to when she was understandably concerned.  Subsequent urine culture with PCP with showed Klebsiella resistant to many antibiotics.  Started on Levaquin which is the only oral option left.  Admitted 12/21/2019-12/27/2019 after presenting with AMS and generalized weakness with particular weakness of  LUE.  MRI of brain with bilateral small multiple acute strokes suggestive of embolic etiology.  Her Pradaxa was transitioned to Eliquis.  LDL 44 and A1c 6.9 while admitted.  She was discharged to SNF and eventually home.   Seen in follow up 01/30/20. She was started on Crestor for hyperlipidemia and history of CVA. She was in a junctional rhythm 55bpm and provided with patient assistance for Eliquis. Since last seen she was hospitalized 02/05/20 for new CVA by MRA. Neurology suspected recurrent strokes due to severe malnutrition given gastric sleeve and not taking vitamins as prescribed (vit B12 deficiency can lead to hypercoagulable state). CT chest/abd/pelvis negative for malignancies though showed  chronic, stable adrenal mass.   Readmitted 04/06/20-04/15/20 with concern for recurrent stroke but was ruled out. Presentation was consistent with small vessel disease on MRI. Her Metforming and HCTZ were discontinued in the setting for nausea though noted on med list from SNF today. Echo bubble study 04/07/20 with normal LVEF and no evidence of ASD or PFO. She was recommended for SNF though discharged home. Since being home her PCP has facilitated admission to SNF due to continued deconditioning.   Presents today for follow up with her husband. Her chief concern today is being discharged from SNF as she has completed 21 days of therapy. She is planning to pay for 3 days out of pocked but searching for additional resources after being home. Reports her legs still feel week when she stands up on them, but no numbness, tingling. Has upcoming appointment with vascular. She has additionally been referred to rheumatology. Has follow up with neurology next month. Does note she feels weak and that her BP at the SNF has routinely <120. She declined to weigh today, but appears to have lost weight. Tells me she is not eating much at SNF. Reports no shortness of breath nor dyspnea on exertion. Reports no chest pain, pressure, or  tightness. No edema, orthopnea, PND. Reports no palpitations.   EKGs/Labs/Other Studies Reviewed:   The following studies were reviewed today:  Echo 08/2019 1. Left ventricular ejection fraction, by estimation, is 60 to 65%. The  left ventricle has normal function. The left ventricle has no regional  wall motion abnormalities. Left ventricular diastolic parameters were  normal.   2. Right ventricular systolic function is normal. The right ventricular  size is normal.   3. The mitral valve is normal in structure. No evidence of mitral valve  regurgitation. No evidence of mitral stenosis.   4. The aortic valve is normal in structure. Aortic valve regurgitation is  not visualized. No aortic stenosis is present.   5. The inferior vena cava is normal in size with <50% respiratory  variability, suggesting right atrial pressure of 8 mmHg.   EKG:  EKG is ordered today.  The ekg ordered today demonstrates junctional rhythm 55bpm with no acute ST/T wave ranges.   Recent Labs: 09/02/2019: B Natriuretic Peptide 384.0 02/06/2020: TSH 0.939 04/05/2020: ALT 14 04/13/2020: Hemoglobin 10.3; Platelets 227 04/14/2020: BUN 25; Creatinine, Ser 0.86; Magnesium 2.1; Potassium 4.7; Sodium 138  Recent Lipid Panel    Component Value Date/Time   CHOL 65 04/07/2020 0500   CHOL 185 10/10/2014 0822   TRIG 51 04/07/2020 0500   HDL 46 04/07/2020 0500   HDL 94 10/10/2014 0822   CHOLHDL 1.4 04/07/2020 0500   VLDL 10 04/07/2020 0500   LDLCALC 9 04/07/2020 0500   LDLCALC 73 10/10/2014 0822    Risk Assessment/Calculations:   CHA2DS2-VASc Score = 6  This indicates a 9.7% annual risk of stroke. The patient's score is based upon: CHF History: No HTN History: Yes Diabetes History: Yes Stroke History: Yes Vascular Disease History: No Age Score: 1 Gender Score: 1  Home Medications   No outpatient medications have been marked as taking for the 05/03/20 encounter (Appointment) with Loel Dubonnet, NP.    Current Facility-Administered Medications for the 05/03/20 encounter (Appointment) with Loel Dubonnet, NP  Medication  . ipratropium-albuterol (DUONEB) 0.5-2.5 (3) MG/3ML nebulizer solution 3 mL     Review of Systems  All other systems reviewed and are otherwise negative except as noted above.  Physical Exam  VS:  There were no vitals taken for this visit. , BMI There is no height or weight on file to calculate BMI.  Wt Readings from Last 3 Encounters:  04/08/20 245 lb 6 oz (111.3 kg)  04/04/20 220 lb (99.8 kg)  03/01/20 220 lb (99.8 kg)   GEN: Well nourished, overweight, well developed, in no acute distress. HEENT: normal. Neck: Supple, no JVD, carotid bruits, or masses. Cardiac: bradycardic, RRR, no murmurs, rubs, or gallops. No clubbing, cyanosis, edema.  Radials/DP/PT 2+ and equal bilaterally.  Respiratory:  Respirations regular and unlabored, clear to auscultation bilaterally. GI: Soft, nontender, nondistended. MS: No deformity or atrophy. Skin: Warm and dry, no rash. Neuro:  Strength and sensation are intact. LUE 4/5 strength. RUE 5/5.  Psych: Normal affect.  Assessment & Plan    1. PAF/chronic anticoagulation- Heart RRR on examination today. Denies palpitations. She is mildly bradycardic today but asymptomatic with no lightheadedness, dizziness, near syncope. We will continue Toprol 50mg  daily. CHA2DS2-VASc Score = 6 [CHF History: No, HTN History: Yes, Diabetes History: Yes, Stroke History: Yes, Vascular Disease History: No, Age Score: 1, Gender Score: 1].  Therefore, the patient's annual risk of stroke is 9.7 %. She is anticoagulated with Eliquis 5mg  BID due to Pradaxa failure and reports no bleeding complications.   2. HTN- Hypotensive today and endorses weakness. Stop Amlodipine 5mg  daily. Continue Losartan-HCTZ 50-12.5mg  daily. For recurrent hypotension, could consider decreased dose.  3. CVA- Continue Rosuvastatin 20mg  daily. No aspirin due to chronic  anticoagulation. Upcoming follow up with neurology. Presently at [facility] for rehabilitation. Worried about resources and transportation to appointments once home, agreeable for our SW team to reach out.   4. DM2 - Continue to follow with PCP.  5. OSA- CPAP compliance encouraged.  Disposition: Follow up in 3 month(s) with Dr. Fletcher Anon or APP   Signed, Loel Dubonnet, NP 05/03/2020, 1:09 PM [place]

## 2020-05-06 ENCOUNTER — Encounter (INDEPENDENT_AMBULATORY_CARE_PROVIDER_SITE_OTHER): Payer: Self-pay

## 2020-05-06 ENCOUNTER — Ambulatory Visit (INDEPENDENT_AMBULATORY_CARE_PROVIDER_SITE_OTHER): Payer: Self-pay | Admitting: Nurse Practitioner

## 2020-05-07 ENCOUNTER — Telehealth: Payer: Self-pay | Admitting: Licensed Clinical Social Worker

## 2020-05-07 ENCOUNTER — Telehealth: Payer: Self-pay | Admitting: Family

## 2020-05-07 DIAGNOSIS — W19XXXA Unspecified fall, initial encounter: Secondary | ICD-10-CM | POA: Diagnosis not present

## 2020-05-07 DIAGNOSIS — R0902 Hypoxemia: Secondary | ICD-10-CM | POA: Diagnosis not present

## 2020-05-07 DIAGNOSIS — R279 Unspecified lack of coordination: Secondary | ICD-10-CM | POA: Diagnosis not present

## 2020-05-07 DIAGNOSIS — Z743 Need for continuous supervision: Secondary | ICD-10-CM | POA: Diagnosis not present

## 2020-05-07 DIAGNOSIS — I959 Hypotension, unspecified: Secondary | ICD-10-CM | POA: Diagnosis not present

## 2020-05-07 NOTE — Telephone Encounter (Signed)
LCSW called pt per referral from Laurann Montana, NP. Was able to reach her, and her husband, at their home phone [phone]. Introduced self, role, reason for call. Pt has just arrived home from [facility]. Pt and pt husband confirm that they will need some assistance w/ getting pt to her upcoming appointments, as she will not be easily able to get in and out of their care. Pt currently utilizing wheelchair and has limited ambulation. LCSW explained [facility] and offered to go ahead and send referral for upcoming appointments. Pt and pt husband in agreement.   I called and was able to confirm with Milana Kidney, CSW, at [facility] that pt has been set up with [facility] for HHPT/OT/RN/Aide services at home. I also have sent her referral in to [facility] for upcoming 2/21 appointment, making note that pt requires wheelchair transport. I will also research alternate transportation services in [facility] that may be able to assist w/ transportation when pt needs it to go to providers outside of [facility]. LCSW remains available and will f/u with them later this week as they settle back in at home. Pt and pt husband have my name and number should any additional needs arise before I reach out to them again.    Westley Hummer, MSW, [place]  [phone]

## 2020-05-07 NOTE — Telephone Encounter (Signed)
Patient was told at last visit to dc amlodipine due to low bp.  She believes facility has been continuing to give this anyway and reports low bp today.   She just arrived to home from facility and will now make sure the amlodipine is held.  Per patient bp before leaving in the ambulance was 109/49 and she reports being tired.    Please call.

## 2020-05-07 NOTE — Telephone Encounter (Signed)
Attempted to call pt back. No answer. No vm set up.   Pt saw Laurann Montana, NP in office 05/03/20. Hypotensive with weakness at visit. Amlodipine 5mg  daily was discontinued.  Pt to continue Losartan-HCTZ 50-12.5mg  daily. For recurrent hypotension, could consider decreased dose, per note.   Will discuss with pt upon return call. Will try to reach pt again.

## 2020-05-08 ENCOUNTER — Telehealth: Payer: Self-pay | Admitting: Licensed Clinical Social Worker

## 2020-05-08 NOTE — Progress Notes (Signed)
Heart and Vascular Care Navigation  05/08/2020  [name] March 05, 1951 [id number]  Reason for Referral:  Transportation/home services/check in                                                                                                    Assessment:         LCSW called pt again this afternoon, reached her via home phone at [phone]. Re-introduced self, role, reason for call. Pt confirms she has settled back at home and is doing alright. She has not yet heard from [employer], I let pt know I would f/u with their services tomorrow to confirm they received referral and are working on arranging ride since pt requires wheelchair [employer]. We reviewed other daily needs including food, utilities and other bills. Currently pt shares that the only really large expenses that they have are her medical bills. Unfortunately it does not appear that outstanding bills on account are >$5,000 to apply for CAFA. Otherwise pt able to access and afford her housing, food, and medications.   I shared that [employer] had arranged additional home health services for pt through [facility]. Pt states they may want to utilize a different home health agency- I explained that if [facility] comes and they are not satisfied [facility] may be able to refer them to a different agency or her primary care office may be able to assist with that change. Pt states understanding. Pt has my number for any additional needs at this time.                          HRT/VAS Care Coordination    Patients Home Cardiology Office [place] Team Social Worker   Living arrangements for the past 2 months Single Family Home   Lives with: Spouse   Patient Current Insurance Coverage Managed Medicare   Patient Has Concern With Paying Medical Bills No   Does Patient Have Prescription Coverage? Yes   Home Assistive Devices/Equipment Wheelchair   DME Agency [employer]   [facility]   Current home services Homehealth aide; Home RN; Home PT; Home OT; DME      Social History:                                                                             SDOH Screenings   Alcohol Screen: Not on file  Depression (PHQ2-9): Low Risk   . PHQ-2 Score: 1  Financial Resource Strain: Low Risk   . Difficulty of Paying Living Expenses: Not very hard  Food Insecurity: No Food Insecurity  . Worried About Charity fundraiser in the Last Year: Never true  . Ran Out of Food in the Last Year: Never  true  Housing: Low Risk   . Last Housing Risk Score: 0  Physical Activity: Not on file  Social Connections: Not on file  Stress: Not on file  Tobacco Use: Low Risk   . Smoking Tobacco Use: Never Smoker  . Smokeless Tobacco Use: Never Used  Transportation Needs: Unmet Transportation Needs  . Lack of Transportation (Medical): Yes  . Lack of Transportation (Non-Medical): Yes    SDOH Interventions: Financial Resources:  Financial Strain Interventions: Other (Comment) (pt not eligible for CAFA at this time)  Food Insecurity:  Food Insecurity Interventions: Intervention Not Indicated  Housing Insecurity:  Housing Interventions: Intervention Not Indicated  Transportation:    Transportation Interventions: Financial planner    Follow-up plan:   LCSW will f/u with Transportation Services to ensure referral received, I also will mail pt alternate transportation resources and home care agencies for personal care services to supplement [facility] services already ordered. I will include my card for any additional questions or concerns.

## 2020-05-08 NOTE — Telephone Encounter (Signed)
Pt's husband, Dellis Filbert (DPR and caregiver) returned call. He is the one who called initially.  States pt was discharged to home yesterday from SNF/Rehab.  On discharge pt's BP was 109/49. Dellis Filbert states that SNF were continuing to give Amlodipine 5mg  daily that was discontinued at last ov on 05/03/20 for hypotension.  Dellis Filbert states now that pt is home, her BP has returned to [facility] at 122/65 and will ensure she does not continue Amlodipine.  Advised that he may monitor pt's BP at home 1-2 hours after AM medications and keep log.  Notify our office of any continued low BP readings and/or new symptoms including dizziness or light-headedness.  Dellis Filbert verbalized understanding and has no further questions or concerns at this time.

## 2020-05-08 NOTE — Telephone Encounter (Signed)
Attempted to call pt. No answer. No vm set up.

## 2020-05-10 ENCOUNTER — Telehealth: Payer: Self-pay | Admitting: Internal Medicine

## 2020-05-10 ENCOUNTER — Other Ambulatory Visit: Payer: Self-pay | Admitting: Internal Medicine

## 2020-05-10 ENCOUNTER — Telehealth: Payer: Self-pay | Admitting: Licensed Clinical Social Worker

## 2020-05-10 NOTE — Telephone Encounter (Signed)
Patient calling in with medication questions. Wanted to know how to take her Metformin and Januvia as the labels on her Januvia was wrong.  Informed the Patient that the metformin is once daily and the Januvia is once daily.   Patient states she takes the metformin in the morning and the Januvia at night.   Patient states she was informed by Cardiology to stop Amlodipine. Confirmed for the Patient that this was not on her list. Patient asked how she should take the losartan? Informed the Patient that this medication in the system looked outdated with no provider name on this.  Informed the Patient to call Cardiology to inquire about this med.   Patient verbalized understanding

## 2020-05-10 NOTE — Telephone Encounter (Signed)
Received confirmation from [facility] w/ [employer] that pt is enrolled and instructions have been reviewed w/ pt and her husband. She is scheduled for her upcoming appointment next week. Remain available as needed moving forward.    Westley Hummer, MSW, [place]  [phone]

## 2020-05-11 DIAGNOSIS — J45909 Unspecified asthma, uncomplicated: Secondary | ICD-10-CM | POA: Diagnosis not present

## 2020-05-11 DIAGNOSIS — I872 Venous insufficiency (chronic) (peripheral): Secondary | ICD-10-CM | POA: Diagnosis not present

## 2020-05-11 DIAGNOSIS — E1142 Type 2 diabetes mellitus with diabetic polyneuropathy: Secondary | ICD-10-CM | POA: Diagnosis not present

## 2020-05-11 DIAGNOSIS — I69398 Other sequelae of cerebral infarction: Secondary | ICD-10-CM | POA: Diagnosis not present

## 2020-05-11 DIAGNOSIS — I69354 Hemiplegia and hemiparesis following cerebral infarction affecting left non-dominant side: Secondary | ICD-10-CM | POA: Diagnosis not present

## 2020-05-11 DIAGNOSIS — E1151 Type 2 diabetes mellitus with diabetic peripheral angiopathy without gangrene: Secondary | ICD-10-CM | POA: Diagnosis not present

## 2020-05-11 DIAGNOSIS — K1379 Other lesions of oral mucosa: Secondary | ICD-10-CM | POA: Diagnosis not present

## 2020-05-11 DIAGNOSIS — I480 Paroxysmal atrial fibrillation: Secondary | ICD-10-CM | POA: Diagnosis not present

## 2020-05-11 DIAGNOSIS — I10 Essential (primary) hypertension: Secondary | ICD-10-CM | POA: Diagnosis not present

## 2020-05-12 DIAGNOSIS — G4733 Obstructive sleep apnea (adult) (pediatric): Secondary | ICD-10-CM | POA: Diagnosis not present

## 2020-05-13 ENCOUNTER — Encounter (INDEPENDENT_AMBULATORY_CARE_PROVIDER_SITE_OTHER): Payer: Self-pay

## 2020-05-13 ENCOUNTER — Telehealth: Payer: Self-pay | Admitting: Licensed Clinical Social Worker

## 2020-05-13 ENCOUNTER — Ambulatory Visit (INDEPENDENT_AMBULATORY_CARE_PROVIDER_SITE_OTHER): Payer: Self-pay | Admitting: Nurse Practitioner

## 2020-05-13 NOTE — Telephone Encounter (Signed)
LCSW received a call this morning from pt husband stating they had not heard from [employer] but that they would like to cancel ride and appointment for today. LCSW shared message I had gotten from [employer] coordinator [place] on 2/17 that pt and husband had been contacted about ride and ride scheduled. I provided him with number for Transportation Services and Vein/Vascular office in [place] for pt husband to contact regarding the above concerns. Encouraged him to call me back if needed, I remain available.   Westley Hummer, MSW, [place]  [phone]

## 2020-05-14 DIAGNOSIS — K1379 Other lesions of oral mucosa: Secondary | ICD-10-CM | POA: Diagnosis not present

## 2020-05-14 DIAGNOSIS — J45909 Unspecified asthma, uncomplicated: Secondary | ICD-10-CM | POA: Diagnosis not present

## 2020-05-14 DIAGNOSIS — I872 Venous insufficiency (chronic) (peripheral): Secondary | ICD-10-CM | POA: Diagnosis not present

## 2020-05-14 DIAGNOSIS — I10 Essential (primary) hypertension: Secondary | ICD-10-CM | POA: Diagnosis not present

## 2020-05-14 DIAGNOSIS — I69398 Other sequelae of cerebral infarction: Secondary | ICD-10-CM | POA: Diagnosis not present

## 2020-05-14 DIAGNOSIS — I480 Paroxysmal atrial fibrillation: Secondary | ICD-10-CM | POA: Diagnosis not present

## 2020-05-14 DIAGNOSIS — E1151 Type 2 diabetes mellitus with diabetic peripheral angiopathy without gangrene: Secondary | ICD-10-CM | POA: Diagnosis not present

## 2020-05-14 DIAGNOSIS — E1142 Type 2 diabetes mellitus with diabetic polyneuropathy: Secondary | ICD-10-CM | POA: Diagnosis not present

## 2020-05-14 DIAGNOSIS — I69354 Hemiplegia and hemiparesis following cerebral infarction affecting left non-dominant side: Secondary | ICD-10-CM | POA: Diagnosis not present

## 2020-05-16 ENCOUNTER — Telehealth: Payer: Self-pay | Admitting: Internal Medicine

## 2020-05-16 NOTE — Telephone Encounter (Signed)
Received fax from [facility] that they have tried to reach Patient 3 times. Patient will need a new referral if she would like to be seen.   Letter sent to the Patient.

## 2020-05-17 DIAGNOSIS — I69398 Other sequelae of cerebral infarction: Secondary | ICD-10-CM | POA: Diagnosis not present

## 2020-05-17 DIAGNOSIS — E1151 Type 2 diabetes mellitus with diabetic peripheral angiopathy without gangrene: Secondary | ICD-10-CM | POA: Diagnosis not present

## 2020-05-17 DIAGNOSIS — E1142 Type 2 diabetes mellitus with diabetic polyneuropathy: Secondary | ICD-10-CM | POA: Diagnosis not present

## 2020-05-17 DIAGNOSIS — I10 Essential (primary) hypertension: Secondary | ICD-10-CM | POA: Diagnosis not present

## 2020-05-17 DIAGNOSIS — K1379 Other lesions of oral mucosa: Secondary | ICD-10-CM | POA: Diagnosis not present

## 2020-05-17 DIAGNOSIS — I69354 Hemiplegia and hemiparesis following cerebral infarction affecting left non-dominant side: Secondary | ICD-10-CM | POA: Diagnosis not present

## 2020-05-17 DIAGNOSIS — I872 Venous insufficiency (chronic) (peripheral): Secondary | ICD-10-CM | POA: Diagnosis not present

## 2020-05-17 DIAGNOSIS — I480 Paroxysmal atrial fibrillation: Secondary | ICD-10-CM | POA: Diagnosis not present

## 2020-05-17 DIAGNOSIS — J45909 Unspecified asthma, uncomplicated: Secondary | ICD-10-CM | POA: Diagnosis not present

## 2020-05-20 ENCOUNTER — Telehealth: Payer: Self-pay

## 2020-05-20 NOTE — Telephone Encounter (Signed)
Faxed order number [id number] back to [facility] @ [phone] on 5/91/63 Cert dates 8/46/65-9/93/57

## 2020-05-20 NOTE — Telephone Encounter (Signed)
Please advise Telephone consult fee or needs a virtual appointment?  Does Patient have home health to do the urine test?

## 2020-05-20 NOTE — Telephone Encounter (Signed)
Pt called and states that she has a UTI-there is blood in her urine. She states that she can not come into the office for an appt due to mobility. Please advise.

## 2020-05-21 ENCOUNTER — Other Ambulatory Visit: Payer: Self-pay | Admitting: Internal Medicine

## 2020-05-21 DIAGNOSIS — I69354 Hemiplegia and hemiparesis following cerebral infarction affecting left non-dominant side: Secondary | ICD-10-CM | POA: Diagnosis not present

## 2020-05-21 DIAGNOSIS — E1142 Type 2 diabetes mellitus with diabetic polyneuropathy: Secondary | ICD-10-CM | POA: Diagnosis not present

## 2020-05-21 DIAGNOSIS — E1151 Type 2 diabetes mellitus with diabetic peripheral angiopathy without gangrene: Secondary | ICD-10-CM | POA: Diagnosis not present

## 2020-05-21 DIAGNOSIS — I10 Essential (primary) hypertension: Secondary | ICD-10-CM | POA: Diagnosis not present

## 2020-05-21 DIAGNOSIS — I872 Venous insufficiency (chronic) (peripheral): Secondary | ICD-10-CM | POA: Diagnosis not present

## 2020-05-21 DIAGNOSIS — I69398 Other sequelae of cerebral infarction: Secondary | ICD-10-CM | POA: Diagnosis not present

## 2020-05-21 DIAGNOSIS — I480 Paroxysmal atrial fibrillation: Secondary | ICD-10-CM | POA: Diagnosis not present

## 2020-05-21 DIAGNOSIS — K1379 Other lesions of oral mucosa: Secondary | ICD-10-CM | POA: Diagnosis not present

## 2020-05-21 DIAGNOSIS — J45909 Unspecified asthma, uncomplicated: Secondary | ICD-10-CM | POA: Diagnosis not present

## 2020-05-21 DIAGNOSIS — N390 Urinary tract infection, site not specified: Secondary | ICD-10-CM

## 2020-05-21 NOTE — Telephone Encounter (Signed)
Pt called her BP was 120/70 she stated  all of the sudden she went  into a blink stare for 7-8 minuets and her BP was 151/96 at that time

## 2020-05-21 NOTE — Telephone Encounter (Signed)
Spoken to patient she is concerned of another mini stroke, Patient stated she was weaker after her episode of dizziness and blank stare. Every time she opened her eyes the room would spin and it was blurry, she stated she may have a UTI causing these sx. Episode lasted 8-10 minutes per patient. NO Heart palpitation, SOB, arm px, jaw px, chest px, patient has not taken any medication to her sx. She stated she will not go to an UC/ED unless it becomes worse.

## 2020-05-21 NOTE — Telephone Encounter (Signed)
She should have [facility] to address this and do home urine test does she? She also has urology [facility]  Rec f/u urology or [facility] or phone consult with me? She cant keep doing antibiotics already has abx resistance  Would rec culture urine and wait for results

## 2020-05-21 NOTE — Telephone Encounter (Signed)
Called Patient and she states that on Sunday she was having bladder spasms and then noticed blood I the urine. States that yesterday and today she has been pushing a lot of water. States she is feeling much better.   Patient declines urine sample and appointment for now. States that she will call back in if symptoms return.  Patient has Home health with [facility] and her nurses name is Horris Latino.

## 2020-05-22 ENCOUNTER — Telehealth: Payer: Self-pay | Admitting: Internal Medicine

## 2020-05-22 DIAGNOSIS — I69398 Other sequelae of cerebral infarction: Secondary | ICD-10-CM | POA: Diagnosis not present

## 2020-05-22 DIAGNOSIS — I10 Essential (primary) hypertension: Secondary | ICD-10-CM | POA: Diagnosis not present

## 2020-05-22 DIAGNOSIS — J45909 Unspecified asthma, uncomplicated: Secondary | ICD-10-CM | POA: Diagnosis not present

## 2020-05-22 DIAGNOSIS — I872 Venous insufficiency (chronic) (peripheral): Secondary | ICD-10-CM | POA: Diagnosis not present

## 2020-05-22 DIAGNOSIS — I69354 Hemiplegia and hemiparesis following cerebral infarction affecting left non-dominant side: Secondary | ICD-10-CM | POA: Diagnosis not present

## 2020-05-22 DIAGNOSIS — E1151 Type 2 diabetes mellitus with diabetic peripheral angiopathy without gangrene: Secondary | ICD-10-CM | POA: Diagnosis not present

## 2020-05-22 DIAGNOSIS — K1379 Other lesions of oral mucosa: Secondary | ICD-10-CM | POA: Diagnosis not present

## 2020-05-22 DIAGNOSIS — E1142 Type 2 diabetes mellitus with diabetic polyneuropathy: Secondary | ICD-10-CM | POA: Diagnosis not present

## 2020-05-22 DIAGNOSIS — I480 Paroxysmal atrial fibrillation: Secondary | ICD-10-CM | POA: Diagnosis not present

## 2020-05-22 NOTE — Telephone Encounter (Signed)
Patient calling back in and states she has been taking some of her medications wrong and wondered if this could be causing her symptoms.  Patient has been taking Eliquis once daily instead of twice daily as prescribed.  Patient has been taking Metformin 1000 daily instead of 500 mg daily as prescribed.  Patient has forgotten to fill her pill packs with the Losartan-HCTZ and has not been taking this since Saturday.    Patient states that her blood pressure has been in the 150s/90s. Wrote this down on a sticky note at the time of the call and gave this to Dr Olivia Mackie [name].  Informed the Patient that miss taking medication like this can be dangerous. Informed her that according to Dr Olivia Mackie [name] Patient could have had a mini stroke due to the miss taking of her blood thinner.  Confirmed with the Patient that her medications we now packaged properly and had her repeat the correct doses and signatures to me.   Asked the Patient if she would like to have [facility] pill pack her medications. Informed the Patient how this works and that this would be the next step in ensuring she received proper care. Patient declined. Informed the Patient that this was the 3rd time we have had to go back over her medications and to think about taking this next step. Patient states she will think on it.   Informed the Patient that if her symptoms worsen she will need to be seen by ED Urgent care. Patient verbalized understanding

## 2020-05-22 NOTE — Telephone Encounter (Signed)
Patient called in about her medication and some other issues wanted to discuss it

## 2020-05-22 NOTE — Telephone Encounter (Signed)
See 05/20/20 telephone encounter

## 2020-05-23 ENCOUNTER — Other Ambulatory Visit (INDEPENDENT_AMBULATORY_CARE_PROVIDER_SITE_OTHER): Payer: Medicare HMO

## 2020-05-23 DIAGNOSIS — J45909 Unspecified asthma, uncomplicated: Secondary | ICD-10-CM | POA: Diagnosis not present

## 2020-05-23 DIAGNOSIS — I69398 Other sequelae of cerebral infarction: Secondary | ICD-10-CM | POA: Diagnosis not present

## 2020-05-23 DIAGNOSIS — E1151 Type 2 diabetes mellitus with diabetic peripheral angiopathy without gangrene: Secondary | ICD-10-CM | POA: Diagnosis not present

## 2020-05-23 DIAGNOSIS — E1142 Type 2 diabetes mellitus with diabetic polyneuropathy: Secondary | ICD-10-CM | POA: Diagnosis not present

## 2020-05-23 DIAGNOSIS — N390 Urinary tract infection, site not specified: Secondary | ICD-10-CM

## 2020-05-23 DIAGNOSIS — I69354 Hemiplegia and hemiparesis following cerebral infarction affecting left non-dominant side: Secondary | ICD-10-CM | POA: Diagnosis not present

## 2020-05-23 DIAGNOSIS — I10 Essential (primary) hypertension: Secondary | ICD-10-CM | POA: Diagnosis not present

## 2020-05-23 DIAGNOSIS — K1379 Other lesions of oral mucosa: Secondary | ICD-10-CM | POA: Diagnosis not present

## 2020-05-23 DIAGNOSIS — I480 Paroxysmal atrial fibrillation: Secondary | ICD-10-CM | POA: Diagnosis not present

## 2020-05-23 DIAGNOSIS — I872 Venous insufficiency (chronic) (peripheral): Secondary | ICD-10-CM | POA: Diagnosis not present

## 2020-05-24 ENCOUNTER — Other Ambulatory Visit: Payer: Self-pay | Admitting: Pain Medicine

## 2020-05-24 DIAGNOSIS — K1379 Other lesions of oral mucosa: Secondary | ICD-10-CM | POA: Diagnosis not present

## 2020-05-24 DIAGNOSIS — E1151 Type 2 diabetes mellitus with diabetic peripheral angiopathy without gangrene: Secondary | ICD-10-CM | POA: Diagnosis not present

## 2020-05-24 DIAGNOSIS — I872 Venous insufficiency (chronic) (peripheral): Secondary | ICD-10-CM | POA: Diagnosis not present

## 2020-05-24 DIAGNOSIS — E1142 Type 2 diabetes mellitus with diabetic polyneuropathy: Secondary | ICD-10-CM | POA: Diagnosis not present

## 2020-05-24 DIAGNOSIS — M797 Fibromyalgia: Secondary | ICD-10-CM

## 2020-05-24 DIAGNOSIS — I480 Paroxysmal atrial fibrillation: Secondary | ICD-10-CM | POA: Diagnosis not present

## 2020-05-24 DIAGNOSIS — I69354 Hemiplegia and hemiparesis following cerebral infarction affecting left non-dominant side: Secondary | ICD-10-CM | POA: Diagnosis not present

## 2020-05-24 DIAGNOSIS — J45909 Unspecified asthma, uncomplicated: Secondary | ICD-10-CM | POA: Diagnosis not present

## 2020-05-24 DIAGNOSIS — I69398 Other sequelae of cerebral infarction: Secondary | ICD-10-CM | POA: Diagnosis not present

## 2020-05-24 DIAGNOSIS — I10 Essential (primary) hypertension: Secondary | ICD-10-CM | POA: Diagnosis not present

## 2020-05-24 LAB — URINALYSIS, ROUTINE W REFLEX MICROSCOPIC
Bilirubin, UA: NEGATIVE
Glucose, UA: NEGATIVE
Ketones, UA: NEGATIVE
Leukocytes,UA: NEGATIVE
Nitrite, UA: NEGATIVE
RBC, UA: NEGATIVE
Specific Gravity, UA: 1.014 (ref 1.005–1.030)
Urobilinogen, Ur: 0.2 mg/dL (ref 0.2–1.0)
pH, UA: 5 (ref 5.0–7.5)

## 2020-05-24 NOTE — Telephone Encounter (Signed)
Reviewed BP 152/85, 151/96, 155/99 due to she missed losartan hctz since 05/18/20 since date of phone call and was doing pill box wrong  She was also only taking eliquis 5 mg qd which is Rx bid per cardiology and taking metformin 1000 mg qd instead of 500 mg qd and the dose was changed since 02/2020  We offered to do pill pack with pharmacy she declined and inquired about the cost advised to call [facility] Her H/H RN helped do pill box for the day  We discussed missing doses of BP meds and BP elevated and blood thinner could risk recurrent stroke and shes already had ~2-3 strokes and needs to f/u with Neurology and Neuro-hospitalist rec rheumatology  We have referred her for these appts which she has missed and knows to call and schedule but transportation is hard as she is at home and mobility limited and help with mobility limited  She also needs to f/u with cardiology  I may be a good idea in the future to also consider long term care which was offered at [facility] last admission stay but family/pt declined

## 2020-05-25 LAB — URINE CULTURE

## 2020-05-27 ENCOUNTER — Telehealth: Payer: Self-pay | Admitting: Internal Medicine

## 2020-05-28 ENCOUNTER — Other Ambulatory Visit: Payer: Self-pay | Admitting: Internal Medicine

## 2020-05-29 ENCOUNTER — Encounter (INDEPENDENT_AMBULATORY_CARE_PROVIDER_SITE_OTHER): Payer: Medicare HMO

## 2020-05-29 ENCOUNTER — Ambulatory Visit (INDEPENDENT_AMBULATORY_CARE_PROVIDER_SITE_OTHER): Payer: Medicare HMO | Admitting: Nurse Practitioner

## 2020-05-29 DIAGNOSIS — K1379 Other lesions of oral mucosa: Secondary | ICD-10-CM | POA: Diagnosis not present

## 2020-05-29 DIAGNOSIS — E1142 Type 2 diabetes mellitus with diabetic polyneuropathy: Secondary | ICD-10-CM | POA: Diagnosis not present

## 2020-05-29 DIAGNOSIS — I480 Paroxysmal atrial fibrillation: Secondary | ICD-10-CM | POA: Diagnosis not present

## 2020-05-29 DIAGNOSIS — I10 Essential (primary) hypertension: Secondary | ICD-10-CM | POA: Diagnosis not present

## 2020-05-29 DIAGNOSIS — I872 Venous insufficiency (chronic) (peripheral): Secondary | ICD-10-CM | POA: Diagnosis not present

## 2020-05-29 DIAGNOSIS — J45909 Unspecified asthma, uncomplicated: Secondary | ICD-10-CM | POA: Diagnosis not present

## 2020-05-29 DIAGNOSIS — E1151 Type 2 diabetes mellitus with diabetic peripheral angiopathy without gangrene: Secondary | ICD-10-CM | POA: Diagnosis not present

## 2020-05-29 DIAGNOSIS — I69354 Hemiplegia and hemiparesis following cerebral infarction affecting left non-dominant side: Secondary | ICD-10-CM | POA: Diagnosis not present

## 2020-05-29 DIAGNOSIS — I69398 Other sequelae of cerebral infarction: Secondary | ICD-10-CM | POA: Diagnosis not present

## 2020-05-30 ENCOUNTER — Telehealth: Payer: Self-pay

## 2020-05-30 NOTE — Telephone Encounter (Signed)
Pt's husband wants Dr Trent to prescribe her B12

## 2020-05-31 DIAGNOSIS — E1151 Type 2 diabetes mellitus with diabetic peripheral angiopathy without gangrene: Secondary | ICD-10-CM | POA: Diagnosis not present

## 2020-05-31 DIAGNOSIS — Z743 Need for continuous supervision: Secondary | ICD-10-CM | POA: Diagnosis not present

## 2020-05-31 DIAGNOSIS — M2550 Pain in unspecified joint: Secondary | ICD-10-CM | POA: Diagnosis not present

## 2020-05-31 DIAGNOSIS — I69319 Unspecified symptoms and signs involving cognitive functions following cerebral infarction: Secondary | ICD-10-CM | POA: Diagnosis not present

## 2020-05-31 DIAGNOSIS — K1379 Other lesions of oral mucosa: Secondary | ICD-10-CM | POA: Diagnosis not present

## 2020-05-31 DIAGNOSIS — I480 Paroxysmal atrial fibrillation: Secondary | ICD-10-CM | POA: Diagnosis not present

## 2020-05-31 DIAGNOSIS — R0902 Hypoxemia: Secondary | ICD-10-CM | POA: Diagnosis not present

## 2020-05-31 DIAGNOSIS — I10 Essential (primary) hypertension: Secondary | ICD-10-CM | POA: Diagnosis not present

## 2020-05-31 DIAGNOSIS — I69354 Hemiplegia and hemiparesis following cerebral infarction affecting left non-dominant side: Secondary | ICD-10-CM | POA: Diagnosis not present

## 2020-05-31 DIAGNOSIS — G8194 Hemiplegia, unspecified affecting left nondominant side: Secondary | ICD-10-CM | POA: Diagnosis not present

## 2020-05-31 DIAGNOSIS — I872 Venous insufficiency (chronic) (peripheral): Secondary | ICD-10-CM | POA: Diagnosis not present

## 2020-05-31 DIAGNOSIS — G459 Transient cerebral ischemic attack, unspecified: Secondary | ICD-10-CM | POA: Diagnosis not present

## 2020-05-31 DIAGNOSIS — W19XXXA Unspecified fall, initial encounter: Secondary | ICD-10-CM | POA: Diagnosis not present

## 2020-05-31 DIAGNOSIS — Z7401 Bed confinement status: Secondary | ICD-10-CM | POA: Diagnosis not present

## 2020-05-31 DIAGNOSIS — I69398 Other sequelae of cerebral infarction: Secondary | ICD-10-CM | POA: Diagnosis not present

## 2020-05-31 DIAGNOSIS — J45909 Unspecified asthma, uncomplicated: Secondary | ICD-10-CM | POA: Diagnosis not present

## 2020-05-31 DIAGNOSIS — E1142 Type 2 diabetes mellitus with diabetic polyneuropathy: Secondary | ICD-10-CM | POA: Diagnosis not present

## 2020-05-31 NOTE — Telephone Encounter (Signed)
She can take this orally its otc B12 [phone] mcg daily  They can purchase this

## 2020-06-01 NOTE — Progress Notes (Signed)
Patient: [name]  Service Category: E/M  Provider: Gaspar Cola, MD  DOB: [name]  DOS: 06/05/2020  Location: Office  MRN: [id number]  Setting: Ambulatory outpatient  Referring Provider: [name], Olivia Mackie *  Type: Established Patient  Specialty: Interventional Pain Management  PCP: [name], [name], MD  Location: Remote location  Delivery: TeleHealth     Virtual Encounter - Pain Management PROVIDER NOTE: Information contained herein reflects review and annotations entered in association with encounter. Interpretation of such information and data should be left to medically-trained personnel. Information provided to patient can be located elsewhere in the medical record under "Patient Instructions". Document created using STT-dictation technology, any transcriptional errors that may result from process are unintentional.    Contact & Pharmacy Preferred: [phone] Home: [phone] (home) Mobile: [phone] (mobile) E-mail: [email or web address] .[place], [place] [address] [place] [address] Phone: [phone] Fax: [phone]   Pre-screening  [name] offered "in-person" vs "virtual" encounter. She indicated preferring virtual for this encounter.   Reason COVID-19*   Social distancing based on CDC and AMA recommendations.   I contacted [name] on 06/05/2020 via telephone.      I clearly identified myself as Gaspar Cola, MD. I verified that I was speaking with the correct person using two identifiers (Name: [name], and date of birth: [name]).  Consent I sought verbal advanced consent from [name] for virtual visit interactions. I informed [name] of possible security and privacy concerns, risks, and limitations associated with providing "not-in-person" medical evaluation and management services. I also informed [name] of the availability of "in-person"  appointments. Finally, I informed her that there would be a charge for the virtual visit and that she could be  personally, fully or partially, financially responsible for it. [name] expressed understanding and agreed to proceed.   Historic Elements   [name] is a 70 y.o. year old, adult patient evaluated today after our last contact on 05/24/2020. [name]  has a past medical history of Abnormal antibody titer, Anxiety and depression, Arthritis, Asthma, Cystocele, Depression, Diabetes ([place]), DVT (deep venous thrombosis) ([place]), Dyspnea, Dysrhythmia, Edema, GERD (gastroesophageal reflux disease), Heart murmur, History of IBS, History of kidney stones, History of methicillin resistant staphylococcus aureus (MRSA) (2008), Hyperlipidemia, Hypertension, Hypothyroidism, Insomnia, Kidney stone, Neuropathy involving both lower extremities, Obesity, Class II, BMI 35-39.9, with comorbidity, Paroxysmal atrial fibrillation ([place]) (2007), Peripheral vascular disease ([place]), Sleep apnea, Urinary incontinence, and Venous stasis dermatitis of both lower extremities. She also  has a past surgical history that includes Ankle surgery (Right); Vaginal hysterectomy; Sleeve Gastroplasty; Lithotripsy; Kidney surgery (Right); transthoracic echocardiogram (08/03/2013); transesophageal echocardiogram (08/08/2013); Tonsillectomy; Total knee arthroplasty (Left); Hiatal hernia repair; Cholecystectomy; I & D extremity (Left, 06/27/2015); Application if wound vac (Left, 06/27/2015); removal of left hematoma (Left); NM GATED MYOVIEW [facility] HX) (February 2017); Reverse shoulder arthroplasty (Right, 10/08/2015); Hernia repair; Ulnar nerve transposition (Right, 08/06/2016); arm surgery; Irrigation and debridement abscess (Left, 09/07/2016); Incision and drainage of wound (Left, 09/25/2016); Application if wound vac (Left, 09/25/2016); Colonoscopy; Upper gi endoscopy; Esophagogastroduodenoscopy (egd) with propofol (N/A, 01/11/2017); Colonoscopy  with propofol (N/A, 01/11/2017); Colonoscopy with propofol (N/A, 02/22/2017); OTHER SURGICAL HISTORY; Total shoulder replacement (Right); Joint replacement (2013); Colonoscopy with propofol (N/A, 05/31/2017); Lower Extremity Angiography (Left, 04/04/2018); Esophagogastroduodenoscopy (egd) with propofol (N/A, 10/25/2018); Incision and drainage of wound (Left, 11/09/2018); and Esophagogastroduodenoscopy (egd) with propofol (N/A, 11/29/2019). [name] has a current  medication list which includes the following prescription(s): acetaminophen, albuterol, alprazolam, apixaban, bupropion, calcium-vitamin d, vitamin d3, cholecalciferol, clobetasol cream, colchicine, diclofenac sodium, duloxetine, ergocalciferol, estradiol, advair hfa, folic acid, levocetirizine, levothyroxine, losartan-hydrochlorothiazide, metformin, metoprolol succinate, tub transfer board, montelukast, multivitamin with minerals, ondansetron, oxybutynin, pantoprazole, pregabalin, rosuvastatin, sitagliptin, sucralfate, thiamine, thiamine hcl, toviaz, trospium, cyanocobalamin, and [START ON 06/23/2020] hydrocodone-acetaminophen, and the following Facility-Administered Medications: ipratropium-albuterol. She  reports that she has never smoked. She has never used smokeless tobacco. She reports that she does not drink alcohol and does not use drugs. [name] is allergic to morphine and related, penicillins, ambien [zolpidem], aspirin, and oxytetracycline.   HPI  Today, she is being contacted for medication management.  The patient indicates doing well with the current medication regimen. No adverse reactions or side effects reported to the medications.  Today the patient was reminded that we have gone back to normal scheduling for medication management and for the purpose of monitoring the medications those visits have to be face-to-face.  We will continue to offer virtual visits, depending on the situation, but we will be limiting the prescription refills to no  more than 30 days.  I have also informed the patient that should we identify a patient attempting to abuse the system, then those prescriptions will go down to only 15 days and upon further review, we may discontinue the medication.  He understood and accepted.  During today's encounter, it was obvious that she still having problems with a speech impediment secondary to her stroke.  RTCB: 06/23/2020 Nonopioids transferred 02/19/2020: Lyrica and Cymbalta  Pharmacotherapy Assessment  Analgesic: Hydrocodone/APAP 5/325 mg, 1 tab PO q 8 hours (15 mg/day of hydrocodone) MME/day:15 mg/day.   Monitoring: [place] PMP: PDMP reviewed during this encounter.       Pharmacotherapy: No side-effects or adverse reactions reported. Compliance: No problems identified. Effectiveness: Clinically acceptable. Plan: Refer to "POC".  UDS:  Summary  Date Value Ref Range Status  11/28/2019 Note  Final    Comment:    ==================================================================== [employer] Select 13 (MW) ==================================================================== Test                             Result       Flag       Units  Drug Present and Declared for Prescription Verification   Alprazolam                     79           EXPECTED   ng/mg creat   Alpha-hydroxyalprazolam        80           EXPECTED   ng/mg creat    Source of alprazolam is a scheduled prescription medication. Alpha-    hydroxyalprazolam is an expected metabolite of alprazolam.  Drug Absent but Declared for Prescription Verification   Hydrocodone                    Not Detected UNEXPECTED ng/mg creat ==================================================================== Test                      Result    Flag   Units      Ref Range   Creatinine              70               mg/dL      >=  20 ==================================================================== Declared Medications:  The flagging and interpretation on this report  are based on the  following declared medications.  Unexpected results may arise from  inaccuracies in the declared medications.   **Note: The testing scope of this panel includes these medications:   Alprazolam (Xanax)  Hydrocodone   **Note: The testing scope of this panel does not include the  following reported medications:   Acetaminophen (Tylenol)  Acetaminophen  Albuterol  Amlodipine (Norvasc)  Bupropion (Wellbutrin XL)  Dabigatran (Pradaxa)  Duloxetine  Estradiol (Estrace)  Fesoterodine (Toviaz)  Fluticasone (Advair)  Hydrochlorothiazide (Hyzaar)  Levocetirizine (Xyzal)  Levofloxacin  Levothyroxine  Losartan (Hyzaar)  Metformin  Metoprolol  Montelukast  Nystatin  Ondansetron  Pantoprazole  Pregabalin (Lyrica)  Salmeterol (Advair)  Topical  Vitamin D2 (Ergocalciferol)  Zinc Oxide ==================================================================== For clinical consultation, please call [phone]. ====================================================================     Laboratory Chemistry Profile   Renal Lab Results  Component Value Date   BUN 25 (H) 04/14/2020   CREATININE 0.86 04/14/2020   BCR 16 11/09/2019   GFR 41.60 (L) 09/14/2019   GFRAA 58 (L) 12/25/2019   GFRNONAA >60 04/14/2020     Hepatic Lab Results  Component Value Date   AST 18 04/05/2020   ALT 14 04/05/2020   ALBUMIN 3.3 (L) 04/05/2020   ALKPHOS 88 04/05/2020   LIPASE 27 07/28/2018   AMMONIA <9 (L) 02/07/2020     Electrolytes Lab Results  Component Value Date   NA 138 04/14/2020   K 4.7 04/14/2020   CL 101 04/14/2020   CALCIUM 8.8 (L) 04/14/2020   MG 2.1 04/14/2020   PHOS 4.1 04/14/2020     Bone Lab Results  Component Value Date   VD25OH 26.5 (L) 02/07/2020     Inflammation (CRP: Acute Phase) (ESR: Chronic Phase) Lab Results  Component Value Date   CRP 3.1 (H) 04/08/2020   ESRSEDRATE 60 (H) 04/08/2020   LATICACIDVEN 1.1 12/20/2019       Note: Above Lab  results reviewed.  Imaging  US Carotid Bilateral CLINICAL DATA:  Cerebral infarction, hypertension, hyperlipidemia and diabetes.  EXAM: BILATERAL CAROTID DUPLEX ULTRASOUND  TECHNIQUE: Pearline Cables scale imaging, color Doppler and duplex ultrasound were performed of bilateral carotid and vertebral arteries in the neck.  COMPARISON:  None.  FINDINGS: Criteria: Quantification of carotid stenosis is based on velocity parameters that correlate the residual internal carotid diameter with NASCET-based stenosis levels, using the diameter of the distal internal carotid lumen as the denominator for stenosis measurement.  The following velocity measurements were obtained:  RIGHT  ICA:  58/16 cm/sec  CCA:  03/70 cm/sec  SYSTOLIC ICA/CCA RATIO:  0.9  ECA:  70 cm/sec  LEFT  ICA:  64/22 cm/sec  CCA:  96/43 cm/sec  SYSTOLIC ICA/CCA RATIO:  1.0  ECA:  53 cm/sec  RIGHT CAROTID ARTERY: There is a mild amount of partially calcified plaque at the level of the distal bulb and proximal right ICA. Estimated right ICA stenosis is less than 50%.  RIGHT VERTEBRAL ARTERY: Antegrade flow with normal waveform and velocity.  LEFT CAROTID ARTERY: No focal plaque identified. No evidence of left carotid stenosis.  LEFT VERTEBRAL ARTERY: Antegrade flow with normal waveform and velocity.  IMPRESSION: 1. Mild plaque at the level of the right carotid bifurcation with estimated proximal right ICA stenosis of less than 50%. 2. No evidence of left-sided carotid plaque or stenosis in the neck.  Electronically Signed   By: Aletta Edouard M.D.   On: 04/08/2020 11:02 MR ANGIO  HEAD WO CONTRAST CLINICAL DATA:  70 year old female with lower extremity weakness, right basal ganglia lacunar infarct and posterior corpus callosum, periatrial infarcts on MRI 04/05/2020.  EXAM: MRA HEAD WITHOUT CONTRAST  TECHNIQUE: Angiographic images of the Circle of Willis were obtained using MRA technique without  intravenous contrast.  COMPARISON:  Head MRA 12/22/2019.  FINDINGS: No intracranial mass effect or ventriculomegaly.  Less motion artifact today compared to the October MRA.  Stable antegrade flow in the posterior circulation with dominant appearing left vertebral artery. No distal vertebral stenosis. Patent vertebrobasilar junction. Patent basilar artery with mild to moderate irregularity but only mild stenosis which appears stable (series 1034, image 11). Patent SCA and PCA origins. Fetal type left PCA again noted. Right posterior communicating artery remains patent. Bilateral PCA branches are within normal limits.  Stable antegrade flow in both ICA siphons. Probable artifact today at the right anterior genu, which was only mildly irregular in October, and supraclinoid flow signal appears stable. Left ICA siphon irregularity without stenosis. Carotid termini, MCA and ACA origins remain patent. Dominant left ACA as before. Anterior communicating artery and bilateral ACA branches are stable and within normal limits. MCA M1 segments and MCA bifurcations remain patent. Mild irregularity and stenosis of left MCA M2 branches is more apparent today but stable, including the posterior left M2 seen on series 1024, image 5.  IMPRESSION: 1. Intracranial MRA is negative for large vessel occlusion and appears stable since October. 2. Suspect artifact at the right anterior genu today. There does appear to be mild atherosclerotic stenosis of the: - mid Basilar Artery. - bilateral MCA M2 branches.  Electronically Signed   By: Genevie Ann M.D.   On: 04/08/2020 10:09  Assessment  The primary encounter diagnosis was Chronic pain syndrome. 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  Plan of Care  Problem-specific:  No problem-specific Assessment & Plan notes found for this encounter.  Ms. LAILEE HOELZEL has a current medication list which includes the following long-term medication(s): apixaban, bupropion, calcium-vitamin d, colchicine, duloxetine, advair hfa, [START ON 06/23/2020] hydrocodone-acetaminophen, levocetirizine, levothyroxine, metformin, metoprolol succinate, montelukast, pregabalin, rosuvastatin, and sitagliptin.  Pharmacotherapy (Medications Ordered): Meds ordered this encounter  Medications   HYDROcodone-acetaminophen (NORCO/VICODIN) 5-325 MG tablet    Sig: Take 1 tablet by mouth every 8 (eight) hours as needed for severe pain. Must last 30 days    Dispense:  90 tablet    Refill:  0    Chronic Pain: STOP Act (Not applicable) Fill 1 day early if closed on refill date. Avoid benzodiazepines within 8 hours of opioids   Orders:  No orders of the defined types were placed in this encounter.  Follow-up plan:   Return in about 18 days (around 06/23/2020) for (F2F), (MM).      Interventional therapies: Planned, scheduled, and/or pending:   Not at this time. (Always stop Pradaxa x 5 days prior to any blocks.)   Considering:   NOTE: PRADAXA ANTICOAGULATION (Stop: 5 days   Restart: 6 hours) NO LUMBAR RFA until BMI < 35. Diagnostic right suprascapular NB Diagnostic right IA knee injection  Possible series of 5 Hyalgan knee injections Diagnostic right-sided genicular NB Possible right-sided genicular nerve RFA  Diagnostic right-sided suprascapular NB  Possible right-sided suprascapular nerve RFA    Palliative PRN treatment(s):  Palliative right IA shoulder joint injection  Palliative right lateral femoral cutaneous NB Palliative right lateral femoral continuous nerve RFA #2      Recent Visits No visits were found meeting these conditions. Showing recent visits within past 90 days and meeting all  other requirements Today's Visits Date Type Provider Dept  06/05/20 Telemedicine Milinda Pointer, MD [facility]  Showing today's visits and meeting all other requirements Future Appointments No visits were found meeting these conditions. Showing future appointments within next 90 days and meeting all other requirements  I discussed the assessment and treatment plan with the patient. The patient was provided an opportunity to ask questions and all were answered. The patient agreed with the plan and demonstrated an understanding of the instructions.  Patient advised to call back or seek an in-person evaluation if the symptoms or condition worsens.  Duration of encounter: 13 minutes.  Note by: Gaspar Cola, MD Date: 06/05/2020; Time: 12:17 PM

## 2020-06-03 NOTE — Telephone Encounter (Signed)
Patients husband informed and verbalized understanding.

## 2020-06-04 ENCOUNTER — Encounter: Payer: Self-pay | Admitting: Pain Medicine

## 2020-06-04 DIAGNOSIS — J45909 Unspecified asthma, uncomplicated: Secondary | ICD-10-CM | POA: Diagnosis not present

## 2020-06-04 DIAGNOSIS — I480 Paroxysmal atrial fibrillation: Secondary | ICD-10-CM | POA: Diagnosis not present

## 2020-06-04 DIAGNOSIS — I69354 Hemiplegia and hemiparesis following cerebral infarction affecting left non-dominant side: Secondary | ICD-10-CM | POA: Diagnosis not present

## 2020-06-04 DIAGNOSIS — I69398 Other sequelae of cerebral infarction: Secondary | ICD-10-CM | POA: Diagnosis not present

## 2020-06-04 DIAGNOSIS — K1379 Other lesions of oral mucosa: Secondary | ICD-10-CM | POA: Diagnosis not present

## 2020-06-04 DIAGNOSIS — I10 Essential (primary) hypertension: Secondary | ICD-10-CM | POA: Diagnosis not present

## 2020-06-04 DIAGNOSIS — E1142 Type 2 diabetes mellitus with diabetic polyneuropathy: Secondary | ICD-10-CM | POA: Diagnosis not present

## 2020-06-04 DIAGNOSIS — I872 Venous insufficiency (chronic) (peripheral): Secondary | ICD-10-CM | POA: Diagnosis not present

## 2020-06-04 DIAGNOSIS — E1151 Type 2 diabetes mellitus with diabetic peripheral angiopathy without gangrene: Secondary | ICD-10-CM | POA: Diagnosis not present

## 2020-06-05 ENCOUNTER — Other Ambulatory Visit: Payer: Self-pay

## 2020-06-05 ENCOUNTER — Ambulatory Visit: Payer: Medicare HMO | Attending: Pain Medicine | Admitting: Pain Medicine

## 2020-06-05 DIAGNOSIS — M797 Fibromyalgia: Secondary | ICD-10-CM

## 2020-06-05 DIAGNOSIS — E1142 Type 2 diabetes mellitus with diabetic polyneuropathy: Secondary | ICD-10-CM

## 2020-06-05 DIAGNOSIS — G5711 Meralgia paresthetica, right lower limb: Secondary | ICD-10-CM

## 2020-06-05 DIAGNOSIS — I6930 Unspecified sequelae of cerebral infarction: Secondary | ICD-10-CM | POA: Diagnosis not present

## 2020-06-05 DIAGNOSIS — G894 Chronic pain syndrome: Secondary | ICD-10-CM

## 2020-06-05 DIAGNOSIS — R69 Illness, unspecified: Secondary | ICD-10-CM | POA: Diagnosis not present

## 2020-06-05 DIAGNOSIS — F1120 Opioid dependence, uncomplicated: Secondary | ICD-10-CM

## 2020-06-05 DIAGNOSIS — Z79899 Other long term (current) drug therapy: Secondary | ICD-10-CM | POA: Diagnosis not present

## 2020-06-05 MED ORDER — HYDROCODONE-ACETAMINOPHEN 5-325 MG PO TABS: 1.0000 | ORAL_TABLET | Freq: Three times a day (TID) | ORAL | 0 refills | Status: DC | PRN

## 2020-06-06 ENCOUNTER — Telehealth: Payer: Self-pay | Admitting: Internal Medicine

## 2020-06-06 ENCOUNTER — Ambulatory Visit: Payer: Medicare HMO | Admitting: Physician Assistant

## 2020-06-06 DIAGNOSIS — I872 Venous insufficiency (chronic) (peripheral): Secondary | ICD-10-CM | POA: Diagnosis not present

## 2020-06-06 DIAGNOSIS — I10 Essential (primary) hypertension: Secondary | ICD-10-CM | POA: Diagnosis not present

## 2020-06-06 DIAGNOSIS — I69354 Hemiplegia and hemiparesis following cerebral infarction affecting left non-dominant side: Secondary | ICD-10-CM | POA: Diagnosis not present

## 2020-06-06 DIAGNOSIS — K1379 Other lesions of oral mucosa: Secondary | ICD-10-CM | POA: Diagnosis not present

## 2020-06-06 DIAGNOSIS — E1151 Type 2 diabetes mellitus with diabetic peripheral angiopathy without gangrene: Secondary | ICD-10-CM | POA: Diagnosis not present

## 2020-06-06 DIAGNOSIS — E1142 Type 2 diabetes mellitus with diabetic polyneuropathy: Secondary | ICD-10-CM | POA: Diagnosis not present

## 2020-06-06 DIAGNOSIS — I480 Paroxysmal atrial fibrillation: Secondary | ICD-10-CM | POA: Diagnosis not present

## 2020-06-06 DIAGNOSIS — J45909 Unspecified asthma, uncomplicated: Secondary | ICD-10-CM | POA: Diagnosis not present

## 2020-06-06 DIAGNOSIS — I69398 Other sequelae of cerebral infarction: Secondary | ICD-10-CM | POA: Diagnosis not present

## 2020-06-06 NOTE — Telephone Encounter (Signed)
Rejection Reason - Patient was No Show" Grayland Jack said on Jun 05, 2020 4:56 PM  "Pt no showed her appt " Grayland Jack said on Apr 23, 2020 12:39 PM  [facility] orthopedic surgery

## 2020-06-07 DIAGNOSIS — I872 Venous insufficiency (chronic) (peripheral): Secondary | ICD-10-CM | POA: Diagnosis not present

## 2020-06-07 DIAGNOSIS — E1151 Type 2 diabetes mellitus with diabetic peripheral angiopathy without gangrene: Secondary | ICD-10-CM | POA: Diagnosis not present

## 2020-06-07 DIAGNOSIS — K1379 Other lesions of oral mucosa: Secondary | ICD-10-CM | POA: Diagnosis not present

## 2020-06-07 DIAGNOSIS — J45909 Unspecified asthma, uncomplicated: Secondary | ICD-10-CM | POA: Diagnosis not present

## 2020-06-07 DIAGNOSIS — I69398 Other sequelae of cerebral infarction: Secondary | ICD-10-CM | POA: Diagnosis not present

## 2020-06-07 DIAGNOSIS — I10 Essential (primary) hypertension: Secondary | ICD-10-CM | POA: Diagnosis not present

## 2020-06-07 DIAGNOSIS — I480 Paroxysmal atrial fibrillation: Secondary | ICD-10-CM | POA: Diagnosis not present

## 2020-06-07 DIAGNOSIS — I69354 Hemiplegia and hemiparesis following cerebral infarction affecting left non-dominant side: Secondary | ICD-10-CM | POA: Diagnosis not present

## 2020-06-07 DIAGNOSIS — E1142 Type 2 diabetes mellitus with diabetic polyneuropathy: Secondary | ICD-10-CM | POA: Diagnosis not present

## 2020-06-09 DIAGNOSIS — G4733 Obstructive sleep apnea (adult) (pediatric): Secondary | ICD-10-CM | POA: Diagnosis not present

## 2020-06-10 DIAGNOSIS — I872 Venous insufficiency (chronic) (peripheral): Secondary | ICD-10-CM | POA: Diagnosis not present

## 2020-06-10 DIAGNOSIS — I69398 Other sequelae of cerebral infarction: Secondary | ICD-10-CM | POA: Diagnosis not present

## 2020-06-10 DIAGNOSIS — E1151 Type 2 diabetes mellitus with diabetic peripheral angiopathy without gangrene: Secondary | ICD-10-CM | POA: Diagnosis not present

## 2020-06-10 DIAGNOSIS — J45909 Unspecified asthma, uncomplicated: Secondary | ICD-10-CM | POA: Diagnosis not present

## 2020-06-10 DIAGNOSIS — E1142 Type 2 diabetes mellitus with diabetic polyneuropathy: Secondary | ICD-10-CM | POA: Diagnosis not present

## 2020-06-10 DIAGNOSIS — K1379 Other lesions of oral mucosa: Secondary | ICD-10-CM | POA: Diagnosis not present

## 2020-06-10 DIAGNOSIS — I69354 Hemiplegia and hemiparesis following cerebral infarction affecting left non-dominant side: Secondary | ICD-10-CM | POA: Diagnosis not present

## 2020-06-10 DIAGNOSIS — I10 Essential (primary) hypertension: Secondary | ICD-10-CM | POA: Diagnosis not present

## 2020-06-10 DIAGNOSIS — I480 Paroxysmal atrial fibrillation: Secondary | ICD-10-CM | POA: Diagnosis not present

## 2020-06-11 DIAGNOSIS — I69398 Other sequelae of cerebral infarction: Secondary | ICD-10-CM | POA: Diagnosis not present

## 2020-06-11 DIAGNOSIS — K1379 Other lesions of oral mucosa: Secondary | ICD-10-CM | POA: Diagnosis not present

## 2020-06-11 DIAGNOSIS — E1151 Type 2 diabetes mellitus with diabetic peripheral angiopathy without gangrene: Secondary | ICD-10-CM | POA: Diagnosis not present

## 2020-06-11 DIAGNOSIS — I480 Paroxysmal atrial fibrillation: Secondary | ICD-10-CM | POA: Diagnosis not present

## 2020-06-11 DIAGNOSIS — E1142 Type 2 diabetes mellitus with diabetic polyneuropathy: Secondary | ICD-10-CM | POA: Diagnosis not present

## 2020-06-11 DIAGNOSIS — I10 Essential (primary) hypertension: Secondary | ICD-10-CM | POA: Diagnosis not present

## 2020-06-11 DIAGNOSIS — I872 Venous insufficiency (chronic) (peripheral): Secondary | ICD-10-CM | POA: Diagnosis not present

## 2020-06-11 DIAGNOSIS — J45909 Unspecified asthma, uncomplicated: Secondary | ICD-10-CM | POA: Diagnosis not present

## 2020-06-11 DIAGNOSIS — I69354 Hemiplegia and hemiparesis following cerebral infarction affecting left non-dominant side: Secondary | ICD-10-CM | POA: Diagnosis not present

## 2020-06-12 ENCOUNTER — Telehealth: Payer: Self-pay

## 2020-06-12 ENCOUNTER — Encounter (INDEPENDENT_AMBULATORY_CARE_PROVIDER_SITE_OTHER): Payer: Medicare HMO

## 2020-06-12 ENCOUNTER — Ambulatory Visit (INDEPENDENT_AMBULATORY_CARE_PROVIDER_SITE_OTHER): Payer: Medicare HMO | Admitting: Nurse Practitioner

## 2020-06-12 NOTE — Telephone Encounter (Signed)
Faxed signed order number [id number] to [facility] [phone] on 06/11/20 MSW effective 06/09/20 1wk1

## 2020-06-12 NOTE — Telephone Encounter (Signed)
Faxed order number [id number] for DC from OT to [phone] on 06/12/20

## 2020-06-14 ENCOUNTER — Ambulatory Visit: Payer: Medicare HMO | Admitting: Physician Assistant

## 2020-06-18 NOTE — Progress Notes (Deleted)
Unable to make it secondary to transportation problems.

## 2020-06-18 NOTE — Telephone Encounter (Signed)
Faxed order number [id number] to [facility] at [phone] on 06/18/20  PT 1WK1 to reasses

## 2020-06-19 ENCOUNTER — Telehealth: Payer: Self-pay | Admitting: Licensed Clinical Social Worker

## 2020-06-19 ENCOUNTER — Encounter: Payer: Medicare HMO | Admitting: Pain Medicine

## 2020-06-19 ENCOUNTER — Other Ambulatory Visit: Payer: Self-pay | Admitting: Pain Medicine

## 2020-06-19 DIAGNOSIS — G894 Chronic pain syndrome: Secondary | ICD-10-CM

## 2020-06-19 DIAGNOSIS — Z79899 Other long term (current) drug therapy: Secondary | ICD-10-CM

## 2020-06-19 DIAGNOSIS — Z8673 Personal history of transient ischemic attack (TIA), and cerebral infarction without residual deficits: Secondary | ICD-10-CM | POA: Insufficient documentation

## 2020-06-19 NOTE — Telephone Encounter (Signed)
LCSW received a call from pt spouse [name], he shares that pt has an appointment w/ pain management physician today but when EMS came they shared that it may be up to $400 for each ride to and from the office. They cannot manage that expense and pt spouse states that he "could never get in touch with our transportation services." LCSW connected pt spouse with Mo, Environmental manager. Unfortunately due to time constraints they may not be able to take pt as pt spouse called at 12:14pm and appt at 12:50pm and pt requires wheelchair/stretcher service. Mo will call her drivers and then get back in touch with pt spouse. If needed pt offered to reschedule her appointment. LCSW will f/u with pt/pt spouse this afternoon to assess outcome of ride scheduling. Pt spouse again provided w/ transportation services number and encouraged to schedule at least 24-48 hrs in advance of appointments.   Westley Hummer, MSW, [place]  [phone]

## 2020-06-19 NOTE — Progress Notes (Signed)
Today the patient was unable to make it to her appointment secondary to transportation problems.  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  She has speech impediments as well as the one-sided body weakness.  According to the PMP and medical record review, on 11/22/2019 the patient received 3 prescriptions for hydrocodone/APAP, out of which only 2 have been filled.  On 02/19/2020 she received another 3 prescriptions, out of which only 1 has been filled.  On 06/05/2020 she received an additional prescription, which has not been filled yet.  Based on this count, the patient has 1 prescription left from 11/22/2019, 2 prescriptions left from 02/19/2020, and one prescription left from 06/05/2020 for a total of 4 hydrocodone/APAP prescriptions.  Since the first prescription from 11/22/2019 was filled done 12/06/2019 and she has used a total of 3 prescriptions with 90 pills each, this means that she has used 270 pills in the last 196 days, which comes out to be an average of 1.3 pills/day.  According to the PMP she has had all of her hydrocodone/APAP failed that "[place].", telephone number [phone].  On her next appointment I will rewrite her prescriptions to truly reflect what she takes and we will cancel all of the other remaining prescriptions.  Nonopioids transferred 02/19/2020: Lyrica and Cymbalta

## 2020-06-20 ENCOUNTER — Telehealth: Payer: Self-pay | Admitting: Licensed Clinical Social Worker

## 2020-06-20 ENCOUNTER — Encounter: Payer: Medicare HMO | Admitting: Pain Medicine

## 2020-06-20 ENCOUNTER — Other Ambulatory Visit: Payer: Self-pay | Admitting: Pain Medicine

## 2020-06-20 DIAGNOSIS — G8929 Other chronic pain: Secondary | ICD-10-CM | POA: Insufficient documentation

## 2020-06-20 DIAGNOSIS — E1151 Type 2 diabetes mellitus with diabetic peripheral angiopathy without gangrene: Secondary | ICD-10-CM | POA: Diagnosis not present

## 2020-06-20 DIAGNOSIS — I69354 Hemiplegia and hemiparesis following cerebral infarction affecting left non-dominant side: Secondary | ICD-10-CM | POA: Diagnosis not present

## 2020-06-20 DIAGNOSIS — I480 Paroxysmal atrial fibrillation: Secondary | ICD-10-CM | POA: Diagnosis not present

## 2020-06-20 DIAGNOSIS — J45909 Unspecified asthma, uncomplicated: Secondary | ICD-10-CM | POA: Diagnosis not present

## 2020-06-20 DIAGNOSIS — E1142 Type 2 diabetes mellitus with diabetic polyneuropathy: Secondary | ICD-10-CM | POA: Diagnosis not present

## 2020-06-20 DIAGNOSIS — M792 Neuralgia and neuritis, unspecified: Secondary | ICD-10-CM | POA: Insufficient documentation

## 2020-06-20 DIAGNOSIS — I10 Essential (primary) hypertension: Secondary | ICD-10-CM | POA: Diagnosis not present

## 2020-06-20 DIAGNOSIS — I872 Venous insufficiency (chronic) (peripheral): Secondary | ICD-10-CM | POA: Diagnosis not present

## 2020-06-20 DIAGNOSIS — I69398 Other sequelae of cerebral infarction: Secondary | ICD-10-CM | POA: Diagnosis not present

## 2020-06-20 DIAGNOSIS — Z79891 Long term (current) use of opiate analgesic: Secondary | ICD-10-CM | POA: Insufficient documentation

## 2020-06-20 DIAGNOSIS — K1379 Other lesions of oral mucosa: Secondary | ICD-10-CM | POA: Diagnosis not present

## 2020-06-20 NOTE — Telephone Encounter (Signed)
Spoke with pt husband this morning- he shares that they got a ride scheduled for appointment today but now he and his wife don't think they need the ride since they were told it may not be necessary for them to come in person for the visit. LCSW connected pt spouse with the [facility] and he was able to reschedule the appointment. I provided pt spouse with the number for Transportation Services- encouraged him to call and cancel the ride and arrange the ride for their appointment on Friday. I remain available as needed moving forward.   Westley Hummer, MSW, [place]  [phone]

## 2020-06-20 NOTE — Progress Notes (Signed)
The patient again called and canceled and reschedule her appointment, for the second day in a row.  I do understand that she recently had a stroke and is unable to move around without assistance.  I sympathize with her, but she needs to make transportation arrangements to come in on either a Monday or Wednesday, and once she has secured that transportation, she should then try to make the appointment to come in.  That way we avoid making appointments that she will not be able to keep.  Today I have again reviewed her medical records during her precharting review.  That review revealed that on 12/21/2019 the patient had a CT of the head done secondary to facial trauma due to a fall.  On 12/22/2019 she had an MRI of the brain due to weakness after the fall.  The study was positive for several small acute infarcts in both cerebral hemispheres.  On 02/04/2020 she had a CT of the head secondary to new onset of headaches.  On 02/05/2020 she had an MRI of the brain secondary to subsequent acute neurological deficits and the suspicion of a stroke.  This MRI showed a 3 mm acute cortical infarct within the posterior left frontal lobe; 2 mm acute infarct within the left frontal lobe periventricular white matter; chronic small vessel infarct within the left centrum semiovale, new when compared to the 12/22/2019 MRI; and cerebral atrophy and severe chronic small vessel ischemic disease with multiple chronic infarcts.  Patient admitted to ED on 04/06/2020 secondary to CVA stroke.  Prior to all of this the patient had been on Eliquis anticoagulation secondary to atrial fibrillation.  (Post-stroke day #75.)   According to the PMP and medical record review, on 11/22/2019 the patient received 3 prescriptions for hydrocodone/APAP, out of which only 2 have been filled.  On 02/19/2020 she received another 3 prescriptions, out of which only 1 has been filled.  On 06/05/2020 she received 1 additional prescription, which has not been filled  yet.  Based on this count, the patient has 1 prescription left from 11/22/2019, 2 prescriptions left from 02/19/2020, and 1 prescription left from 06/05/2020 for a total of 4 hydrocodone/APAP prescriptions.  Since the first prescription from 11/22/2019 was filled done 12/06/2019 and she has used a total of 3 prescriptions with 90 pills each, this means that she has used 270 pills in the last 196 days, which comes out to be an average of 1.3 pills/day.  According to the PMP she has had all of her hydrocodone/APAP filled at "[facility] telephone number [phone].  On her next appointment I will rewrite her prescriptions to truly reflect what she takes and we will cancel all of the other remaining prescriptions.   If the patient was to use the prescriptions that she still has in the pharmacy (4), with each being 90 pills, she would end up with a total of 360 pills.  Assuming that she was to use 2 pills/day instead of 1.3, then she would be using 60 pills/month.  360 pills divided by 60 pills/month equals 6 months.  6 months from today's date would be Saturday, December 21, 2020 (12/21/2020).  Before, the issue here is that some of those prescriptions will expire before she can get them filled.  Nonopioids where transferred 02/19/2020: Lyrica and Cymbalta

## 2020-06-20 NOTE — Progress Notes (Deleted)
Second day in a row the patient goes to cancel and reschedule.

## 2020-06-21 ENCOUNTER — Ambulatory Visit: Payer: Medicare HMO | Admitting: Physician Assistant

## 2020-06-22 DIAGNOSIS — I69319 Unspecified symptoms and signs involving cognitive functions following cerebral infarction: Secondary | ICD-10-CM | POA: Insufficient documentation

## 2020-06-25 ENCOUNTER — Telehealth: Payer: Self-pay | Admitting: Licensed Clinical Social Worker

## 2020-06-25 NOTE — Progress Notes (Signed)
Heart and Vascular Care Navigation  06/25/2020  [name] Sep 04, 1950 [id number]  Reason for Referral:   Engaged with patient by telephone for follow up visit for Heart and Vascular Care Coordination.                                                                                                   Assessment:    LCSW spoke with pt spouse at [phone]. Pt spouse shares that they called [facility] and they quoted them about $100 for transport. I shared again w/ pt spouse the [employer] number and encouraged him to call them directly to get questions answered straight from their services. He shares pt has an appointment tomorrow, again reminded him due to the nature of the door to door rides it may be best to give them a few days heads up if possible in the future. Pt husband states understanding, will call me if he does not get in touch with Transportation Services this morning.                                      HRT/VAS Care Coordination    Patients Home Cardiology Office [place] Team Social Worker   Living arrangements for the past 2 months Single Family Home   Lives with: Spouse   Patient Current Insurance Coverage Managed Medicare   Patient Has Concern With Paying Medical Bills No   Does Patient Have Prescription Coverage? Yes   Home Assistive Devices/Equipment Wheelchair   DME Agency [employer]   [facility]   Current home services Homehealth aide; Home RN; Home PT; Home OT; DME      Social History:                                                                             SDOH Screenings   Alcohol Screen: Not on file  Depression (MGQ6-7): Not on file  Financial Resource Strain: Low Risk   . Difficulty of Paying Living Expenses: Not very hard  Food Insecurity: No Food Insecurity  . Worried About Charity fundraiser in the Last Year: Never true  . Ran Out of Food in the Last Year: Never true  Housing:  Low Risk   . Last Housing Risk Score: 0  Physical Activity: Not on file  Social Connections: Not on file  Stress: Not on file  Tobacco Use: Low Risk   . Smoking Tobacco Use: Never Smoker  . Smokeless Tobacco Use: Never Used  Transportation Needs: Unmet Transportation Needs  . Lack of Transportation (Medical): Yes  . Lack of Transportation (Non-Medical): Yes    SDOH Interventions: Transportation:   Transportation Interventions: [facility]   Follow-up plan:   LCSW remains available as needed for pt for any ongoing needs. Pt spouse has my number.

## 2020-06-26 ENCOUNTER — Other Ambulatory Visit: Payer: Self-pay

## 2020-06-26 ENCOUNTER — Encounter (INDEPENDENT_AMBULATORY_CARE_PROVIDER_SITE_OTHER): Payer: Self-pay | Admitting: Nurse Practitioner

## 2020-06-26 ENCOUNTER — Ambulatory Visit (INDEPENDENT_AMBULATORY_CARE_PROVIDER_SITE_OTHER): Payer: Medicare HMO

## 2020-06-26 ENCOUNTER — Ambulatory Visit (INDEPENDENT_AMBULATORY_CARE_PROVIDER_SITE_OTHER): Payer: Medicare HMO | Admitting: Nurse Practitioner

## 2020-06-26 VITALS — BP 102/68 | HR 60 | Resp 16

## 2020-06-26 DIAGNOSIS — L97409 Non-pressure chronic ulcer of unspecified heel and midfoot with unspecified severity: Secondary | ICD-10-CM

## 2020-06-26 DIAGNOSIS — I10 Essential (primary) hypertension: Secondary | ICD-10-CM

## 2020-06-26 DIAGNOSIS — I739 Peripheral vascular disease, unspecified: Secondary | ICD-10-CM

## 2020-06-26 NOTE — Telephone Encounter (Signed)
Faxed signed order number [id number] back to [facility] home [facility] @[phone] on 06/25/20

## 2020-06-27 NOTE — Progress Notes (Signed)
Subjective:    Patient ID: [name], adult    DOB: 1950-10-06, 70 y.o.   MRN: [id number] Chief Complaint  Patient presents with  . Follow-up    [facility] 2 wk abi    Nashiya Disbrow is a 70 year old female that returns today for lower extremity pain and ulcerations.  The patient is well-known to our practice.  The patient has had a longstanding history of ulcerations.  Previously had she had issues with lower extremity edema however that does not appear to be an issue today.  The patient describes having burning stinging aching pain whenever her feet are elevated.  She needs to dangle them to relieve the pain.  The patient does have known neuropathy.  The patient also has multiple wounds on her left lower extremity.  She has a wound on her second toe as well as a shallow callus ulceration on her heel area.  She denies any fever or chills.  The patient does not walk frequently so it is hard to determine if she has claudication symptoms.  Today noninvasive studies show an ABI 1.13 on the right and 1.22 on the left.  She has a TBI 0.96 on the right and 0.71 on the left.  Her tibial artery waveforms bilaterally are triphasic.  The patient has good strong toe waveforms on the right foot with diminished waveforms on the left great toe.  The studies are comparable to previous studies done on 03/22/2018 which show a right ABI 1.21 and a left of 1.15.   Review of Systems  Skin: Positive for wound.  Neurological: Positive for weakness.  All other systems reviewed and are negative.      Objective:   Physical Exam Vitals reviewed.  HENT:     Head: Normocephalic.  Cardiovascular:     Rate and Rhythm: Normal rate.     Pulses: Decreased pulses.  Pulmonary:     Effort: Pulmonary effort is normal.  Feet:     Left foot:     Skin integrity: Ulcer and dry skin present.  Neurological:     Mental Status: She is alert and oriented to person, place, and time.  Psychiatric:        Mood and Affect: Mood  normal.        Behavior: Behavior normal.        Thought Content: Thought content normal.        Judgment: Judgment normal.     BP 102/68 (BP Location: Right Arm)   Pulse 60   Resp 16   Past Medical History:  Diagnosis Date  . Abnormal antibody titer   . Anxiety and depression   . Arthritis   . Asthma   . Cystocele   . Depression   . Diabetes ([place])   . DVT (deep venous thrombosis) (HCC)    Righ calf  . Dyspnea    11/08/2018 - "more now due to lack of exercise"  . Dysrhythmia    afib  . Edema   . GERD (gastroesophageal reflux disease)   . Heart murmur    Echocardiogram June 2015: Mild MR (possible vegetation seen on TTE, not seen on TEE), normal LV size with moderate concentric LVH. Normal function EF 60-65%. Normal diastolic function. Mild LA dilation.  Marland Kitchen History of IBS   . History of kidney stones   . History of methicillin resistant staphylococcus aureus (MRSA) 2008  . Hyperlipidemia   . Hypertension   . Hypothyroidism   . Insomnia   .  Kidney stone    kidney stones with lithotripsy .  [facility]- "adrenal glands"  . Neuropathy involving both lower extremities   . Obesity, Class II, BMI 35-39.9, with comorbidity   . Paroxysmal atrial fibrillation ([place]) 2007   In June 2015, Cardiac Event Monitor: Mostly SR/sinus arrhythmia with PVCs that are frequent. Short bursts of A. fib lasting several minutes;; CHA2DS2-VASc Score = 5 (age, Female, PVD, DM, HTN)  . Peripheral vascular disease ([place])   . Sleep apnea    use C-PAP  . Urinary incontinence   . Venous stasis dermatitis of both lower extremities     Social History   Socioeconomic History  . Marital status: Married    Spouse name: Not on file  . Number of children: Not on file  . Years of education: Not on file  . Highest education level: Not on file  Occupational History  . Not on file  Tobacco Use  . Smoking status: Never Smoker  . Smokeless tobacco: Never Used  Vaping Use  . Vaping Use: Never used   Substance and Sexual Activity  . Alcohol use: No    Alcohol/week: 0.0 standard drinks  . Drug use: Never  . Sexual activity: Yes  Other Topics Concern  . Not on file  Social History Narrative   She is currently married -- for 30+ years. Does not work. Does not smoke or take alcohol. She never smoked. She exercises at least 3 days a week since before her gastric surgery.   Marital status reviewed in history of present illness.   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 Difficulty of Paying Living Expenses: Not very hard  Food Insecurity: No Food Insecurity  . Worried About Charity fundraiser in the Last Year: Never true  . Ran Out of Food in the Last Year: Never true  Transportation Needs: Unmet Transportation Needs  . Lack of Transportation (Medical): Yes  . Lack of Transportation (Non-Medical): Yes  Physical Activity: Not on file  Stress: Not on file  Social Connections: Not on file  Intimate Partner Violence: Not on file    Past Surgical History:  Procedure Laterality Date  . ANKLE SURGERY Right   . APPLICATION OF WOUND VAC Left 06/27/2015   Procedure: APPLICATION OF WOUND VAC ( POSSIBLE ) ;  Surgeon: [name], MD;  Location: [facility];  Service: Vascular;  Laterality: Left;  . APPLICATION OF WOUND VAC Left 09/25/2016   Procedure: APPLICATION OF WOUND VAC;  Surgeon: [name], DPM;  Location: [facility];  Service: Podiatry;  Laterality: Left;  . arm surgery     right    . CHOLECYSTECTOMY    . COLONOSCOPY    . COLONOSCOPY WITH PROPOFOL N/A 01/11/2017   Procedure: COLONOSCOPY WITH PROPOFOL;  Surgeon: [name], MD;  Location: [facility] ENDOSCOPY;  Service: Endoscopy;  Laterality: N/A;  . COLONOSCOPY WITH PROPOFOL N/A 02/22/2017   Procedure: COLONOSCOPY WITH PROPOFOL;  Surgeon: [name], MD;  Location: [facility] ENDOSCOPY;  Service:  Endoscopy;  Laterality: N/A;  . COLONOSCOPY WITH PROPOFOL N/A 05/31/2017   Procedure: COLONOSCOPY WITH PROPOFOL;  Surgeon: [name], MD;  Location: [facility] ENDOSCOPY;  Service: Endoscopy;  Laterality: N/A;  . ESOPHAGOGASTRODUODENOSCOPY (EGD) WITH PROPOFOL N/A 01/11/2017   Procedure: ESOPHAGOGASTRODUODENOSCOPY (EGD) WITH PROPOFOL;  Surgeon: Loistine Simas  U, MD;  Location: [facility] ENDOSCOPY;  Service: Endoscopy;  Laterality: N/A;  . ESOPHAGOGASTRODUODENOSCOPY (EGD) WITH PROPOFOL N/A 10/25/2018   Procedure: ESOPHAGOGASTRODUODENOSCOPY (EGD) WITH PROPOFOL;  Surgeon: [name], MD;  Location[facility] ENDOSCOPY;  Service: Endoscopy;  Laterality: N/A;  . ESOPHAGOGASTRODUODENOSCOPY (EGD) WITH PROPOFOL N/A 11/29/2019   Procedure: ESOPHAGOGASTRODUODENOSCOPY (EGD) WITH PROPOFOL;  Surgeon: Toledo, Benay Pike, MD;  Location: [facility] ENDOSCOPY;  Service: Gastroenterology;  Laterality: N/A;  . HERNIA REPAIR     umbilical  . HIATAL HERNIA REPAIR    . I & D EXTREMITY Left 06/27/2015   Procedure: IRRIGATION AND DEBRIDEMENT EXTREMITY            ( CALF HEMATOMA ) POSSIBLE WOUND VAC;  Surgeon: [name], MD;  Location: [facility];  Service: Vascular;  Laterality: Left;  . INCISION AND DRAINAGE OF WOUND Left 09/25/2016   Procedure: IRRIGATION AND DEBRIDEMENT - PARTIAL RESECTION OF ACHILLES TENDON WITH WOUND VAC APPLICATION;  Surgeon: [name], DPM;  Location: [facility] OR[facility];  Service: Podiatry;  Laterality: Left;  . INCISION AND DRAINAGE OF WOUND Left 11/09/2018   Procedure: Excision of left foot wound with ACell placement;  Surgeon: Wallace Going, DO;  Location: [place];  Service: Plastics;  Laterality: Left;  . IRRIGATION AND DEBRIDEMENT ABSCESS Left 09/07/2016   Procedure: IRRIGATION AND DEBRIDEMENT ABSCESS LEFT HEEL;  Surgeon: [name], DPM;  Location: [facility];  Service: Podiatry;  Laterality: Left;  . JOINT REPLACEMENT  2013   left knee replacement  . KIDNEY SURGERY Right    kidney stones  .  LITHOTRIPSY    . LOWER EXTREMITY ANGIOGRAPHY Left 04/04/2018   Procedure: LOWER EXTREMITY ANGIOGRAPHY;  Surgeon: [name], MD;  Location: [place] CV LAB;  Service: Cardiovascular;  Laterality: Left;  . NM GATED MYOVIEW ([place] [facility])  February 2017   Likely breast attenuation. LOW RISK study. Normal EF 55-60%.  . OTHER SURGICAL HISTORY     08/2016 or 09/2016 surgery on achilles tenso h/o staph infection heal. Dr. [name]  . removal of left hematoma Left    leg  . REVERSE SHOULDER ARTHROPLASTY Right 10/08/2015   Procedure: REVERSE SHOULDER ARTHROPLASTY;  Surgeon: [name], MD;  Location: [facility];  Service: Orthopedics;  Laterality: Right;  . SLEEVE GASTROPLASTY    . TONSILLECTOMY    . TOTAL KNEE ARTHROPLASTY Left   . TOTAL SHOULDER REPLACEMENT Right   . TRANSESOPHAGEAL ECHOCARDIOGRAM  08/08/2013   Mild LVH, EF 60-65%. Moderate LA dilation and mild RA dilation. Mild MR with no evidence of stenosis and no evidence of endocarditis. A false chordae is noted.  . TRANSTHORACIC ECHOCARDIOGRAM  08/03/2013   Mild-Moderate concentric LVH, EF 60-65%. Normal diastolic function. Mild LA dilation. Mild MR with possible vegetation  - not confirmed on TEE   . ULNAR NERVE TRANSPOSITION Right 08/06/2016   Procedure: ULNAR NERVE DECOMPRESSION/TRANSPOSITION;  Surgeon: [name], MD;  Location: [facility];  Service: Orthopedics;  Laterality: Right;  . UPPER GI ENDOSCOPY    . VAGINAL HYSTERECTOMY      Family History  Problem Relation Age of Onset  . Skin cancer Father   . Diabetes Father   . Hypertension Father   . Peripheral vascular disease Father   . Cancer Father   . Cerebral aneurysm Father   . Alcohol abuse Father   . Varicose Veins Mother   . Kidney disease Mother   . Arthritis Mother   . Mental illness Sister   . Cancer Maternal  Aunt        breast  . Breast cancer Maternal Aunt   . Arthritis Maternal Grandmother   . Hypertension Maternal Grandmother   . Diabetes Maternal  Grandmother   . Arthritis Maternal Grandfather   . Heart disease Maternal Grandfather   . Hypertension Maternal Grandfather   . Arthritis Paternal Grandmother   . Hypertension Paternal Grandmother   . Diabetes Paternal Grandmother   . Arthritis Paternal Grandfather   . Hypertension Paternal Grandfather   . Bladder Cancer Neg Hx   . Kidney cancer Neg Hx     Allergies  Allergen Reactions  . Morphine And Related Other (See Comments) and Nausea Only    Patient becomes very confused Other reaction(s): Other (See Comments), Other (See Comments) Patient becomes very confused Patient becomes very confused   . Penicillins Hives, Shortness Of Breath, Swelling and Other (See Comments)    Facial swelling Has patient had a PCN reaction causing immediate rash, facial/tongue/throat swelling, SOB or lightheadedness with hypotension: Yes Has patient had a PCN reaction causing severe rash involving mucus membranes or skin necrosis: Yes Has patient had a PCN reaction that required hospitalization No Has patient had a PCN reaction occurring within the last 10 years: No If all of the above answers are "NO", then may proceed with Cephalosporin use.  Other reaction(s): Other (See Comments), Other (See Comments) Facial swelling Has patient had a PCN reaction causing immediate rash, facial/tongue/throat swelling, SOB or lightheadedness with hypotension: Yes Has patient had a PCN reaction causing severe rash involving mucus membranes or skin necrosis: Yes Has patient had a PCN reaction that required hospitalization No Has patient had a PCN reaction occurring within the last 10 years: No If all of the above answers are "NO", then may proceed with Cephalosporin use. Facial swelling Has patient had a PCN reaction causing immediate rash, facial/tongue/throat swelling, SOB or lightheadedness with hypotension: Yes Has patient had a PCN reaction causing severe rash involving mucus membranes or skin necrosis:  Yes Has patient had a PCN reaction that required hospitalization No Has patient had a PCN reaction occurring within the last 10 years: No If all of the above answers are "NO", then may proceed with Cephalosporin use. Facial swelling Has patient had a PCN reaction causing immediate rash, facial/tongue/throat swelling, SOB or lightheadedness with hypotension: Yes Has patient had a PCN reaction causing severe rash involving mucus membranes or skin necrosis: Yes Has patient had a PCN reaction that required hospitalization No Has patient had a PCN reaction occurring within the last 10 years: No If all of the above answers are "NO", then may proceed with Cephalosporin use.   . Ambien [Zolpidem]     Hallucination   . Aspirin Hives  . Oxytetracycline Hives    Other reaction(s): Unknown     CBC Latest Ref Rng & Units 04/13/2020 04/10/2020 04/05/2020  WBC 4.0 - 10.5 K/uL 6.9 6.1 7.1  Hemoglobin 12.0 - 15.0 g/dL 10.3(L) 9.5(L) 10.0(L)  Hematocrit 36.0 - 46.0 % 33.1(L) 29.6(L) 32.9(L)  Platelets 150 - 400 K/uL 227 227 247      CMP     Component Value Date/Time   NA 138 04/14/2020 0623   NA 139 11/09/2019 1055   NA 140 09/26/2013 0428   K 4.7 04/14/2020 0623   K 3.9 09/26/2013 0428   CL 101 04/14/2020 0623   CL 105 09/26/2013 0428   CO2 25 04/14/2020 0623   CO2 29 09/26/2013 0428   GLUCOSE 113 (H) 04/14/2020  1638   GLUCOSE 134 (H) 09/26/2013 0428   BUN 25 (H) 04/14/2020 0623   BUN 25 11/09/2019 1055   BUN 16 09/26/2013 0428   CREATININE 0.86 04/14/2020 0623   CREATININE 0.84 09/27/2013 2028   CALCIUM 8.8 (L) 04/14/2020 0623   CALCIUM 8.4 (L) 09/26/2013 0428   PROT 7.4 04/05/2020 1215   PROT 6.5 10/10/2014 0822   PROT 7.5 09/25/2013 1540   ALBUMIN 3.3 (L) 04/05/2020 1215   ALBUMIN 4.2 10/10/2014 0822   ALBUMIN 3.7 09/25/2013 1540   AST 18 04/05/2020 1215   AST 24 09/25/2013 1540   ALT 14 04/05/2020 1215   ALT 27 09/25/2013 1540   ALKPHOS 88 04/05/2020 1215   ALKPHOS 102  09/25/2013 1540   BILITOT 0.5 04/05/2020 1215   BILITOT 0.3 10/10/2014 0822   BILITOT 0.3 09/25/2013 1540   GFRNONAA >60 04/14/2020 0623   GFRNONAA >60 09/27/2013 2028   GFRAA 58 (L) 12/25/2019 0308   GFRAA >60 09/27/2013 2028     No results found.     Assessment & Plan:   1. Nonhealing ulcer of heel ([place]) Patient will continue with wound care for treatment of her wounds.  2. Essential hypertension Continue antihypertensive medications as already ordered, these medications have been reviewed and there are no changes at this time.   3. PAD (peripheral artery disease) ([place]) Today the patient has ABIs and TBI's that are within normal limits however there are abnormal toe waveforms on her left lower extremity.  I had a long discussion with the patient and her husband in regards to the difference between atherosclerotic disease within the main vessels versus microvascular disease.  The patient previously underwent a left lower extremity angiogram for something similar on 04/04/2018.  Ultimately the patient had no significant stenosis that was present in her lower extremities.  I suspect that the patient does have microvascular disease which is not amenable to endovascular intervention.  I think that careful follow-up in this instance would be in the patient's best interest as she continues to see wound care.  We will have the patient return in 2 to 3 weeks to evaluate wound progression.   Current Outpatient Medications on File Prior to Visit  Medication Sig Dispense Refill  . acetaminophen (TYLENOL) 325 MG tablet Take 2 tablets (650 mg total) by mouth every 6 (six) hours as needed for mild pain or moderate pain (or Fever >/= 101).    Marland Kitchen albuterol (PROVENTIL) (2.5 MG/3ML) 0.083% nebulizer solution Take 2.5 mg by nebulization every 4 (four) hours as needed for wheezing or shortness of breath.    . ALPRAZolam (XANAX) 0.5 MG tablet TAKE 1/2 TABLET BY MOUTH TWICE DAILY AS NEEDED FOR ANXIETY 60  tablet 2  . apixaban (ELIQUIS) 5 MG TABS tablet Take 1 tablet (5 mg total) by mouth 2 (two) times daily. 180 tablet 2  . buPROPion (WELLBUTRIN XL) 150 MG 24 hr tablet Take 1 tablet (150 mg total) by mouth daily. 90 tablet 3  . calcium-vitamin D (OSCAL WITH D) 500-200 MG-UNIT tablet Take 1 tablet by mouth 2 (two) times daily with a meal. 60 tablet 0  . cholecalciferol (VITAMIN D) 25 MCG tablet Take 1 tablet (1,000 Units total) by mouth 2 (two) times daily with a meal. 30 tablet 2  . Cholecalciferol (VITAMIN D-3 PO) TAKE 1 TABLET BY MOUTH 2 TIMES DAILY WITH A MEAL    . clobetasol cream (TEMOVATE) 4.53 % Apply 1 application topically 2 (two) times daily as needed (  skin irritation).     . colchicine 0.6 MG tablet Take 1 tablet (0.6 mg total) by mouth as needed. May repeat 1 pill in 1 hr. No more than 3 total pills 1.8 mg in 24 hours as needed gout flare 30 tablet 2  . diclofenac Sodium (VOLTAREN) 1 % GEL Apply 2 g topically 3 (three) times daily. 100 g 2  . ergocalciferol (VITAMIN D2) 1.25 MG (50000 UT) capsule Take 50,000 Units by mouth once a week.    . estradiol (ESTRACE) 0.1 MG/GM vaginal cream Place 1 Applicatorful vaginally every Monday, Wednesday, and Friday. Apply 0.5mg  (pea-sized amount)  just inside the vaginal introitus with a finger-tip on Monday, Wednesday and Friday nights, 42.5 g 11  . fluticasone-salmeterol (ADVAIR HFA) 115-21 MCG/ACT inhaler Inhale 2 puffs into the lungs daily. Rinse mouth thoroughly after use 1 Inhaler 12  . folic acid (FOLVITE) 1 MG tablet Take 1 tablet (1 mg total) by mouth daily. 30 tablet 2  . HYDROcodone-acetaminophen (NORCO/VICODIN) 5-325 MG tablet Take 1 tablet by mouth every 8 (eight) hours as needed for severe pain. Must last 30 days 90 tablet 0  . levocetirizine (XYZAL) 5 MG tablet Take 0.5 tablets (2.5 mg total) by mouth every evening. (Patient taking differently: Take 5 mg by mouth at bedtime as needed for allergies.) 45 tablet 3  . levothyroxine (SYNTHROID)  175 MCG tablet Take 1 tablet (175 mcg total) by mouth daily before breakfast. 90 tablet 1  . losartan-hydrochlorothiazide (HYZAAR) 50-12.5 MG tablet Take 1 tablet by mouth daily.    . metFORMIN (GLUCOPHAGE) 500 MG tablet Take 1 tablet (500 mg total) by mouth daily with breakfast. With food 90 tablet 3  . metoprolol succinate (TOPROL-XL) 50 MG 24 hr tablet TAKE 1 TABLET BY MOUTH DAILY (Patient taking differently: Take 50 mg by mouth daily.) 90 tablet 1  . Misc. Devices (TUB TRANSFER BOARD) MISC 1 Device by Does not apply route as needed. 1 each 0  . montelukast (SINGULAIR) 10 MG tablet Take 1 tablet (10 mg total) by mouth at bedtime. 90 tablet 1  . Multiple Vitamin (MULTIVITAMIN WITH MINERALS) TABS tablet Take 1 tablet by mouth daily.    . ondansetron (ZOFRAN) 4 MG tablet Take 1 tablet (4 mg total) by mouth every 8 (eight) hours as needed for nausea or vomiting. 90 tablet 1  . oxybutynin (DITROPAN XL) 15 MG 24 hr tablet Take 15 mg by mouth at bedtime.    . pantoprazole (PROTONIX) 40 MG tablet Take 40 mg by mouth daily.     . rosuvastatin (CRESTOR) 20 MG tablet Take 1 tablet (20 mg total) by mouth daily. 30 tablet 5  . sitaGLIPtin (JANUVIA) 25 MG tablet Take 1 tablet (25 mg total) by mouth daily. 90 tablet 3  . sucralfate (CARAFATE) 1 GM/10ML suspension Take 1 g by mouth 3 (three) times daily.     Marland Kitchen thiamine 100 MG tablet Take 0.5 tablets (50 mg total) by mouth 2 (two) times daily with a meal. 60 tablet 2  . Thiamine HCl (VITAMIN B-1 PO) TAKE 1/2 TABLET BY MOUTH 2 TIMES DAILY WITH A MEAL    . TOVIAZ 8 MG TB24 tablet TAKE ONE TABLET BY MOUTH EVERY DAY (Patient taking differently: Take 8 mg by mouth daily.) 90 tablet 3  . trospium (SANCTURA) 20 MG tablet Take 20 mg by mouth 2 (two) times daily.    . vitamin B-12 1000 MCG tablet Take 1 tablet (1,000 mcg total) by mouth daily. 30 tablet  2  . DULoxetine (CYMBALTA) 60 MG capsule Take 1 capsule (60 mg total) by mouth daily. 30 capsule 2  . pregabalin  (LYRICA) 50 MG capsule Take 1 capsule (50 mg total) by mouth 3 (three) times daily. 90 capsule 2   Current Facility-Administered Medications on File Prior to Visit  Medication Dose Route Frequency Provider Last Rate Last Admin  . ipratropium-albuterol (DUONEB) 0.5-2.5 (3) MG/3ML nebulizer solution 3 mL  3 mL Nebulization Q6H [name], [name], MD        There are no Patient Instructions on file for this visit. No follow-ups on file.   Kris Hartmann, NP

## 2020-06-27 NOTE — Progress Notes (Incomplete)
Subjective:    Patient ID: [name], adult    DOB: Aug 23, 1950, 70 y.o.   MRN: [id number] Chief Complaint  Patient presents with  . Follow-up    [facility] 2 wk abi    HPI  Review of Systems     Objective:   Physical Exam  BP 102/68 (BP Location: Right Arm)   Pulse 60   Resp 16   Past Medical History:  Diagnosis Date  . Abnormal antibody titer   . Anxiety and depression   . Arthritis   . Asthma   . Cystocele   . Depression   . Diabetes ([place])   . DVT (deep venous thrombosis) (HCC)    Righ calf  . Dyspnea    11/08/2018 - "more now due to lack of exercise"  . Dysrhythmia    afib  . Edema   . GERD (gastroesophageal reflux disease)   . Heart murmur    Echocardiogram June 2015: Mild MR (possible vegetation seen on TTE, not seen on TEE), normal LV size with moderate concentric LVH. Normal function EF 60-65%. Normal diastolic function. Mild LA dilation.  Marland Kitchen History of IBS   . History of kidney stones   . History of methicillin resistant staphylococcus aureus (MRSA) 2008  . Hyperlipidemia   . Hypertension   . Hypothyroidism   . Insomnia   . Kidney stone    kidney stones with lithotripsy .  [facility]- "adrenal glands"  . Neuropathy involving both lower extremities   . Obesity, Class II, BMI 35-39.9, with comorbidity   . Paroxysmal atrial fibrillation ([place]) 2007   In June 2015, Cardiac Event Monitor: Mostly SR/sinus arrhythmia with PVCs that are frequent. Short bursts of A. fib lasting several minutes;; CHA2DS2-VASc Score = 5 (age, Female, PVD, DM, HTN)  . Peripheral vascular disease ([place])   . Sleep apnea    use C-PAP  . Urinary incontinence   . Venous stasis dermatitis of both lower extremities     Social History   Socioeconomic History  . Marital status: Married    Spouse name: Not on file  . Number of children: Not on file  . Years of education: Not on file  . Highest education level: Not on file  Occupational History  . Not on file  Tobacco Use  .  Smoking status: Never Smoker  . Smokeless tobacco: Never Used  Vaping Use  . Vaping Use: Never used  Substance and Sexual Activity  . Alcohol use: No    Alcohol/week: 0.0 standard drinks  . Drug use: Never  . Sexual activity: Yes  Other Topics Concern  . Not on file  Social History Narrative   She is currently married -- for 30+ years. Does not work. Does not smoke or take alcohol. She never smoked. She exercises at least 3 days a week since before her gastric surgery.   Marital status reviewed in history of present illness.   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 Difficulty of Paying Living Expenses: Not very hard  Food Insecurity: No Food Insecurity  . Worried About Charity fundraiser in the Last Year: Never true  . Ran Out of Food in the Last Year: Never true  Transportation Needs: Unmet Transportation Needs  . Lack of Transportation (Medical): Yes  . Lack of Transportation (  Non-Medical): Yes  Physical Activity: Not on file  Stress: Not on file  Social Connections: Not on file  Intimate Partner Violence: Not on file    Past Surgical History:  Procedure Laterality Date  . ANKLE SURGERY Right   . APPLICATION OF WOUND VAC Left 06/27/2015   Procedure: APPLICATION OF WOUND VAC ( POSSIBLE ) ;  Surgeon: [name], MD;  Location: [facility];  Service: Vascular;  Laterality: Left;  . APPLICATION OF WOUND VAC Left 09/25/2016   Procedure: APPLICATION OF WOUND VAC;  Surgeon: [name], DPM;  Location: [facility];  Service: Podiatry;  Laterality: Left;  . arm surgery     right    . CHOLECYSTECTOMY    . COLONOSCOPY    . COLONOSCOPY WITH PROPOFOL N/A 01/11/2017   Procedure: COLONOSCOPY WITH PROPOFOL;  Surgeon: [name], MD;  Location: [facility] ENDOSCOPY;  Service: Endoscopy;  Laterality: N/A;  . COLONOSCOPY WITH PROPOFOL N/A 02/22/2017    Procedure: COLONOSCOPY WITH PROPOFOL;  Surgeon: [name], MD;  Location: [facility] ENDOSCOPY;  Service: Endoscopy;  Laterality: N/A;  . COLONOSCOPY WITH PROPOFOL N/A 05/31/2017   Procedure: COLONOSCOPY WITH PROPOFOL;  Surgeon: [name], MD;  Location: [facility] ENDOSCOPY;  Service: Endoscopy;  Laterality: N/A;  . ESOPHAGOGASTRODUODENOSCOPY (EGD) WITH PROPOFOL N/A 01/11/2017   Procedure: ESOPHAGOGASTRODUODENOSCOPY (EGD) WITH PROPOFOL;  Surgeon: [name], MD;  Location: [facility] ENDOSCOPY;  Service: Endoscopy;  Laterality: N/A;  . ESOPHAGOGASTRODUODENOSCOPY (EGD) WITH PROPOFOL N/A 10/25/2018   Procedure: ESOPHAGOGASTRODUODENOSCOPY (EGD) WITH PROPOFOL;  Surgeon: [name], MD;  Location: [facility] ENDOSCOPY;  Service: Endoscopy;  Laterality: N/A;  . ESOPHAGOGASTRODUODENOSCOPY (EGD) WITH PROPOFOL N/A 11/29/2019   Procedure: ESOPHAGOGASTRODUODENOSCOPY (EGD) WITH PROPOFOL;  Surgeon: Toledo, Benay Pike, MD;  Location: [facility] ENDOSCOPY;  Service: Gastroenterology;  Laterality: N/A;  . HERNIA REPAIR     umbilical  . HIATAL HERNIA REPAIR    . I & D EXTREMITY Left 06/27/2015   Procedure: IRRIGATION AND DEBRIDEMENT EXTREMITY            ( CALF HEMATOMA ) POSSIBLE WOUND VAC;  Surgeon: [name], MD;  Location: [facility] ORS;  Service: Vascular;  Laterality: Left;  . INCISION AND DRAINAGE OF WOUND Left 09/25/2016   Procedure: IRRIGATION AND DEBRIDEMENT - PARTIAL RESECTION OF ACHILLES TENDON WITH WOUND VAC APPLICATION;  Surgeon: [name], DPM;  Location: [facility] ORS;  Service: Podiatry;  Laterality: Left;  . INCISION AND DRAINAGE OF WOUND Left 11/09/2018   Procedure: Excision of left foot wound with ACell placement;  Surgeon: Wallace Going, DO;  Location: [place];  Service: Plastics;  Laterality: Left;  . IRRIGATION AND DEBRIDEMENT ABSCESS Left 09/07/2016   Procedure: IRRIGATION AND DEBRIDEMENT ABSCESS LEFT HEEL;  Surgeon: [name], DPM;  Location: [facility];  Service: Podiatry;  Laterality:  Left;  . JOINT REPLACEMENT  2013   left knee replacement  . KIDNEY SURGERY Right    kidney stones  . LITHOTRIPSY    . LOWER EXTREMITY ANGIOGRAPHY Left 04/04/2018   Procedure: LOWER EXTREMITY ANGIOGRAPHY;  Surgeon: [name], MD;  Location: [facility];  Service: Cardiovascular;  Laterality: Left;  . NM GATED MYOVIEW ([place] [facility])  February 2017   Likely breast attenuation. LOW RISK study. Normal EF 55-60%.  . OTHER SURGICAL HISTORY     08/2016 or 09/2016 surgery on achilles tenso h/o staph infection heal. Dr. [name]  . removal of left hematoma Left    leg  . REVERSE SHOULDER ARTHROPLASTY Right  10/08/2015   Procedure: REVERSE SHOULDER ARTHROPLASTY;  Surgeon: [name], MD;  Location: [facility] ORS;  Service: Orthopedics;  Laterality: Right;  . SLEEVE GASTROPLASTY    . TONSILLECTOMY    . TOTAL KNEE ARTHROPLASTY Left   . TOTAL SHOULDER REPLACEMENT Right   . TRANSESOPHAGEAL ECHOCARDIOGRAM  08/08/2013   Mild LVH, EF 60-65%. Moderate LA dilation and mild RA dilation. Mild MR with no evidence of stenosis and no evidence of endocarditis. A false chordae is noted.  . TRANSTHORACIC ECHOCARDIOGRAM  08/03/2013   Mild-Moderate concentric LVH, EF 60-65%. Normal diastolic function. Mild LA dilation. Mild MR with possible vegetation  - not confirmed on TEE   . ULNAR NERVE TRANSPOSITION Right 08/06/2016   Procedure: ULNAR NERVE DECOMPRESSION/TRANSPOSITION;  Surgeon: [name], MD;  Location: [facility] OR[facility];  Service: Orthopedics;  Laterality: Right;  . UPPER GI ENDOSCOPY    . VAGINAL HYSTERECTOMY      Family History  Problem Relation Age of Onset  . Skin cancer Father   . Diabetes Father   . Hypertension Father   . Peripheral vascular disease Father   . Cancer Father   . Cerebral aneurysm Father   . Alcohol abuse Father   . Varicose Veins Mother   . Kidney disease Mother   . Arthritis Mother   . Mental illness Sister   . Cancer Maternal Aunt        breast  . Breast cancer Maternal Aunt    . Arthritis Maternal Grandmother   . Hypertension Maternal Grandmother   . Diabetes Maternal Grandmother   . Arthritis Maternal Grandfather   . Heart disease Maternal Grandfather   . Hypertension Maternal Grandfather   . Arthritis Paternal Grandmother   . Hypertension Paternal Grandmother   . Diabetes Paternal Grandmother   . Arthritis Paternal Grandfather   . Hypertension Paternal Grandfather   . Bladder Cancer Neg Hx   . Kidney cancer Neg Hx     Allergies  Allergen Reactions  . Morphine And Related Other (See Comments) and Nausea Only    Patient becomes very confused Other reaction(s): Other (See Comments), Other (See Comments) Patient becomes very confused Patient becomes very confused   . Penicillins Hives, Shortness Of Breath, Swelling and Other (See Comments)    Facial swelling Has patient had a PCN reaction causing immediate rash, facial/tongue/throat swelling, SOB or lightheadedness with hypotension: Yes Has patient had a PCN reaction causing severe rash involving mucus membranes or skin necrosis: Yes Has patient had a PCN reaction that required hospitalization No Has patient had a PCN reaction occurring within the last 10 years: No If all of the above answers are "NO", then may proceed with Cephalosporin use.  Other reaction(s): Other (See Comments), Other (See Comments) Facial swelling Has patient had a PCN reaction causing immediate rash, facial/tongue/throat swelling, SOB or lightheadedness with hypotension: Yes Has patient had a PCN reaction causing severe rash involving mucus membranes or skin necrosis: Yes Has patient had a PCN reaction that required hospitalization No Has patient had a PCN reaction occurring within the last 10 years: No If all of the above answers are "NO", then may proceed with Cephalosporin use. Facial swelling Has patient had a PCN reaction causing immediate rash, facial/tongue/throat swelling, SOB or lightheadedness with hypotension:  Yes Has patient had a PCN reaction causing severe rash involving mucus membranes or skin necrosis: Yes Has patient had a PCN reaction that required hospitalization No Has patient had a PCN reaction occurring within the last 10  years: No If all of the above answers are "NO", then may proceed with Cephalosporin use. Facial swelling Has patient had a PCN reaction causing immediate rash, facial/tongue/throat swelling, SOB or lightheadedness with hypotension: Yes Has patient had a PCN reaction causing severe rash involving mucus membranes or skin necrosis: Yes Has patient had a PCN reaction that required hospitalization No Has patient had a PCN reaction occurring within the last 10 years: No If all of the above answers are "NO", then may proceed with Cephalosporin use.   . Ambien [Zolpidem]     Hallucination   . Aspirin Hives  . Oxytetracycline Hives    Other reaction(s): Unknown     CBC Latest Ref Rng & Units 04/13/2020 04/10/2020 04/05/2020  WBC 4.0 - 10.5 K/uL 6.9 6.1 7.1  Hemoglobin 12.0 - 15.0 g/dL 10.3(L) 9.5(L) 10.0(L)  Hematocrit 36.0 - 46.0 % 33.1(L) 29.6(L) 32.9(L)  Platelets 150 - 400 K/uL 227 227 247      CMP     Component Value Date/Time   NA 138 04/14/2020 0623   NA 139 11/09/2019 1055   NA 140 09/26/2013 0428   K 4.7 04/14/2020 0623   K 3.9 09/26/2013 0428   CL 101 04/14/2020 0623   CL 105 09/26/2013 0428   CO2 25 04/14/2020 0623   CO2 29 09/26/2013 0428   GLUCOSE 113 (H) 04/14/2020 0623   GLUCOSE 134 (H) 09/26/2013 0428   BUN 25 (H) 04/14/2020 0623   BUN 25 11/09/2019 1055   BUN 16 09/26/2013 0428   CREATININE 0.86 04/14/2020 0623   CREATININE 0.84 09/27/2013 2028   CALCIUM 8.8 (L) 04/14/2020 0623   CALCIUM 8.4 (L) 09/26/2013 0428   PROT 7.4 04/05/2020 1215   PROT 6.5 10/10/2014 0822   PROT 7.5 09/25/2013 1540   ALBUMIN 3.3 (L) 04/05/2020 1215   ALBUMIN 4.2 10/10/2014 0822   ALBUMIN 3.7 09/25/2013 1540   AST 18 04/05/2020 1215   AST 24 09/25/2013 1540    ALT 14 04/05/2020 1215   ALT 27 09/25/2013 1540   ALKPHOS 88 04/05/2020 1215   ALKPHOS 102 09/25/2013 1540   BILITOT 0.5 04/05/2020 1215   BILITOT 0.3 10/10/2014 0822   BILITOT 0.3 09/25/2013 1540   GFRNONAA >60 04/14/2020 0623   GFRNONAA >60 09/27/2013 2028   GFRAA 58 (L) 12/25/2019 0308   GFRAA >60 09/27/2013 2028     No results found.     Assessment & Plan:   1. Nonhealing ulcer of heel ([place]) Patient will continue with wound care for treatment of her wounds.  2. Essential hypertension Continue antihypertensive medications as already ordered, these medications have been reviewed and there are no changes at this time.   3. PAD (peripheral artery disease) ([place]) Today the patient has ABIs and TBI's that are within normal limits however there are abnormal toe waveforms on her left lower extremity.  I had a long discussion with the patient and her husband in regards to the difference between atherosclerotic disease within the main vessels versus microvascular disease.   Current Outpatient Medications on File Prior to Visit  Medication Sig Dispense Refill  . acetaminophen (TYLENOL) 325 MG tablet Take 2 tablets (650 mg total) by mouth every 6 (six) hours as needed for mild pain or moderate pain (or Fever >/= 101).    Marland Kitchen albuterol (PROVENTIL) (2.5 MG/3ML) 0.083% nebulizer solution Take 2.5 mg by nebulization every 4 (four) hours as needed for wheezing or shortness of breath.    . ALPRAZolam (XANAX) 0.5  MG tablet TAKE 1/2 TABLET BY MOUTH TWICE DAILY AS NEEDED FOR ANXIETY 60 tablet 2  . apixaban (ELIQUIS) 5 MG TABS tablet Take 1 tablet (5 mg total) by mouth 2 (two) times daily. 180 tablet 2  . buPROPion (WELLBUTRIN XL) 150 MG 24 hr tablet Take 1 tablet (150 mg total) by mouth daily. 90 tablet 3  . calcium-vitamin D (OSCAL WITH D) 500-200 MG-UNIT tablet Take 1 tablet by mouth 2 (two) times daily with a meal. 60 tablet 0  . cholecalciferol (VITAMIN D) 25 MCG tablet Take 1 tablet (1,000  Units total) by mouth 2 (two) times daily with a meal. 30 tablet 2  . Cholecalciferol (VITAMIN D-3 PO) TAKE 1 TABLET BY MOUTH 2 TIMES DAILY WITH A MEAL    . clobetasol cream (TEMOVATE) 3.66 % Apply 1 application topically 2 (two) times daily as needed (skin irritation).     . colchicine 0.6 MG tablet Take 1 tablet (0.6 mg total) by mouth as needed. May repeat 1 pill in 1 hr. No more than 3 total pills 1.8 mg in 24 hours as needed gout flare 30 tablet 2  . diclofenac Sodium (VOLTAREN) 1 % GEL Apply 2 g topically 3 (three) times daily. 100 g 2  . ergocalciferol (VITAMIN D2) 1.25 MG (50000 UT) capsule Take 50,000 Units by mouth once a week.    . estradiol (ESTRACE) 0.1 MG/GM vaginal cream Place 1 Applicatorful vaginally every Monday, Wednesday, and Friday. Apply 0.5mg  (pea-sized amount)  just inside the vaginal introitus with a finger-tip on Monday, Wednesday and Friday nights, 42.5 g 11  . fluticasone-salmeterol (ADVAIR HFA) 115-21 MCG/ACT inhaler Inhale 2 puffs into the lungs daily. Rinse mouth thoroughly after use 1 Inhaler 12  . folic acid (FOLVITE) 1 MG tablet Take 1 tablet (1 mg total) by mouth daily. 30 tablet 2  . HYDROcodone-acetaminophen (NORCO/VICODIN) 5-325 MG tablet Take 1 tablet by mouth every 8 (eight) hours as needed for severe pain. Must last 30 days 90 tablet 0  . levocetirizine (XYZAL) 5 MG tablet Take 0.5 tablets (2.5 mg total) by mouth every evening. (Patient taking differently: Take 5 mg by mouth at bedtime as needed for allergies.) 45 tablet 3  . levothyroxine (SYNTHROID) 175 MCG tablet Take 1 tablet (175 mcg total) by mouth daily before breakfast. 90 tablet 1  . losartan-hydrochlorothiazide (HYZAAR) 50-12.5 MG tablet Take 1 tablet by mouth daily.    . metFORMIN (GLUCOPHAGE) 500 MG tablet Take 1 tablet (500 mg total) by mouth daily with breakfast. With food 90 tablet 3  . metoprolol succinate (TOPROL-XL) 50 MG 24 hr tablet TAKE 1 TABLET BY MOUTH DAILY (Patient taking differently:  Take 50 mg by mouth daily.) 90 tablet 1  . Misc. Devices (TUB TRANSFER BOARD) MISC 1 Device by Does not apply route as needed. 1 each 0  . montelukast (SINGULAIR) 10 MG tablet Take 1 tablet (10 mg total) by mouth at bedtime. 90 tablet 1  . Multiple Vitamin (MULTIVITAMIN WITH MINERALS) TABS tablet Take 1 tablet by mouth daily.    . ondansetron (ZOFRAN) 4 MG tablet Take 1 tablet (4 mg total) by mouth every 8 (eight) hours as needed for nausea or vomiting. 90 tablet 1  . oxybutynin (DITROPAN XL) 15 MG 24 hr tablet Take 15 mg by mouth at bedtime.    . pantoprazole (PROTONIX) 40 MG tablet Take 40 mg by mouth daily.     . rosuvastatin (CRESTOR) 20 MG tablet Take 1 tablet (20 mg total) by  mouth daily. 30 tablet 5  . sitaGLIPtin (JANUVIA) 25 MG tablet Take 1 tablet (25 mg total) by mouth daily. 90 tablet 3  . sucralfate (CARAFATE) 1 GM/10ML suspension Take 1 g by mouth 3 (three) times daily.     Marland Kitchen thiamine 100 MG tablet Take 0.5 tablets (50 mg total) by mouth 2 (two) times daily with a meal. 60 tablet 2  . Thiamine HCl (VITAMIN B-1 PO) TAKE 1/2 TABLET BY MOUTH 2 TIMES DAILY WITH A MEAL    . TOVIAZ 8 MG TB24 tablet TAKE ONE TABLET BY MOUTH EVERY DAY (Patient taking differently: Take 8 mg by mouth daily.) 90 tablet 3  . trospium (SANCTURA) 20 MG tablet Take 20 mg by mouth 2 (two) times daily.    . vitamin B-12 1000 MCG tablet Take 1 tablet (1,000 mcg total) by mouth daily. 30 tablet 2  . DULoxetine (CYMBALTA) 60 MG capsule Take 1 capsule (60 mg total) by mouth daily. 30 capsule 2  . pregabalin (LYRICA) 50 MG capsule Take 1 capsule (50 mg total) by mouth 3 (three) times daily. 90 capsule 2   Current Facility-Administered Medications on File Prior to Visit  Medication Dose Route Frequency Provider Last Rate Last Admin  . ipratropium-albuterol (DUONEB) 0.5-2.5 (3) MG/3ML nebulizer solution 3 mL  3 mL Nebulization Q6H [name], [name], MD        There are no Patient Instructions on file for this  visit. No follow-ups on file.   Kris Hartmann, NP

## 2020-06-28 ENCOUNTER — Telehealth: Payer: Self-pay | Admitting: Internal Medicine

## 2020-06-28 ENCOUNTER — Other Ambulatory Visit: Payer: Self-pay

## 2020-06-28 ENCOUNTER — Encounter: Payer: Medicare HMO | Attending: Physician Assistant | Admitting: Physician Assistant

## 2020-06-28 ENCOUNTER — Other Ambulatory Visit: Payer: Self-pay | Admitting: Internal Medicine

## 2020-06-28 DIAGNOSIS — E1151 Type 2 diabetes mellitus with diabetic peripheral angiopathy without gangrene: Secondary | ICD-10-CM | POA: Diagnosis not present

## 2020-06-28 DIAGNOSIS — I10 Essential (primary) hypertension: Secondary | ICD-10-CM | POA: Insufficient documentation

## 2020-06-28 DIAGNOSIS — E11621 Type 2 diabetes mellitus with foot ulcer: Secondary | ICD-10-CM | POA: Diagnosis not present

## 2020-06-28 DIAGNOSIS — Z86718 Personal history of other venous thrombosis and embolism: Secondary | ICD-10-CM | POA: Insufficient documentation

## 2020-06-28 DIAGNOSIS — E1140 Type 2 diabetes mellitus with diabetic neuropathy, unspecified: Secondary | ICD-10-CM | POA: Diagnosis not present

## 2020-06-28 DIAGNOSIS — I872 Venous insufficiency (chronic) (peripheral): Secondary | ICD-10-CM | POA: Diagnosis not present

## 2020-06-28 DIAGNOSIS — L97322 Non-pressure chronic ulcer of left ankle with fat layer exposed: Secondary | ICD-10-CM | POA: Insufficient documentation

## 2020-06-28 DIAGNOSIS — L97812 Non-pressure chronic ulcer of other part of right lower leg with fat layer exposed: Secondary | ICD-10-CM | POA: Insufficient documentation

## 2020-06-28 DIAGNOSIS — L97522 Non-pressure chronic ulcer of other part of left foot with fat layer exposed: Secondary | ICD-10-CM | POA: Diagnosis not present

## 2020-06-28 DIAGNOSIS — Z7901 Long term (current) use of anticoagulants: Secondary | ICD-10-CM | POA: Diagnosis not present

## 2020-06-28 DIAGNOSIS — L97422 Non-pressure chronic ulcer of left heel and midfoot with fat layer exposed: Secondary | ICD-10-CM | POA: Diagnosis not present

## 2020-06-28 NOTE — Telephone Encounter (Signed)
Pt needs a refill on folic acid (FOLVITE) 1 MG tablet sent to [facility] Another doctor filled it while she was in the hospital

## 2020-06-28 NOTE — Progress Notes (Signed)
[name] ([id number]) Visit Report for 06/28/2020 Abuse/Suicide Risk Screen Details Patient Name: [name], [name] Date of Service: 06/28/2020 2:15 PM Medical Record Number: [id number] Patient Account Number: [id number] Date of Birth/Sex: 1950/08/28 (70 y.o. F) Treating RN: Cornell Barman Primary Care Mychael Smock: [name], Olivia Mackie Other Clinician: Jeanine Luz Referring Breken Nazari: Caroline More Treating Cataldo Cosgriff/Extender: Skipper Cliche in Treatment: 0 Abuse/Suicide Risk Screen Items Answer ABUSE RISK SCREEN: Has anyone close to you tried to hurt or harm you recentlyo No Do you feel uncomfortable with anyone in your familyo No Has anyone forced you do things that you didnot want to doo No Electronic Signature(s) Signed: 06/28/2020 3:47:25 PM By: Gretta Cool, BSN, RN, CWS, Kim RN, BSN Entered By: Gretta Cool, BSN, RN, CWS, Kim on 06/28/2020 15:47:25 Roehr, [name] ([id number]) -------------------------------------------------------------------------------- Activities of Daily Living Details Patient Name: [name] Date of Service: 06/28/2020 2:15 PM Medical Record Number: [id number] Patient Account Number: [id number] Date of Birth/Sex: [birth year] (69 y.o. F) Treating RN: Donnamarie Poag Primary Care Jozalynn Noyce: [name], Tamala [name] Clinician: Jeanine Luz Referring Hoy Fallert: Caroline More Treating Aizlynn Digilio/Extender: Skipper Cliche in Treatment: 0 Activities of Daily Living Items Answer Activities of Daily Living (Please select one for each item) Drive Automobile Not Able Take Medications Need Assistance Use Telephone Need Assistance Care for Appearance Need Assistance Use Toilet Need Assistance Bath / Shower Need Assistance Dress Self Need Assistance Feed Self Need Assistance Walk Need Assistance Get In / Out Bed Need Assistance Housework Not Able Prepare Meals Not Able Handle Money Need Assistance Shop for Self Not Able Electronic Signature(s) Signed:  06/28/2020 4:43:09 PM By: Donnamarie Poag Entered By: Donnamarie Poag on 06/28/2020 14:45:26 Bodenheimer, [name] ([id number]) -------------------------------------------------------------------------------- Education Screening Details Patient Name: [name] Date of Service: 06/28/2020 2:15 PM Medical Record Number: [id number] Patient Account Number: [id number] Date of Birth/Sex: [birth year] (69 y.o. F) Treating RN: Donnamarie Poag Primary Care Tyland Klemens: [name], Olivia Mackie Other Clinician: Jeanine Luz Referring Marra Fraga: Caroline More Treating Nehal Shives/Extender: Skipper Cliche in Treatment: 0 Learning Preferences/Education Level/Primary Language Learning Preference: Explanation Highest Education Level: College or Above Preferred Language: English Cognitive Barrier Language Barrier: No Translator Needed: No Memory Deficit: No Emotional Barrier: No Cultural/Religious Beliefs Affecting Medical Care: No Physical Barrier Impaired Vision: No Impaired Hearing: No Decreased Hand dexterity: No Knowledge/Comprehension Knowledge Level: High Comprehension Level: High Ability to understand written instructions: High Ability to understand verbal instructions: High Motivation Anxiety Level: Anxious Cooperation: Cooperative Education Importance: Acknowledges Need Interest in Health Problems: Asks Questions Perception: Coherent Willingness to Engage in Self-Management High Activities: Readiness to Engage in Self-Management High Activities: Electronic Signature(s) Signed: 06/28/2020 4:43:09 PM By: Donnamarie Poag Entered By: Donnamarie Poag on 06/28/2020 14:46:03 Busk, [name] ([id number]) -------------------------------------------------------------------------------- Fall Risk Assessment Details Patient Name: [name] Date of Service: 06/28/2020 2:15 PM Medical Record Number: [id number] Patient Account Number: [id number] Date of Birth/Sex: Dec 19, 1950 (69 y.o. F) Treating RN:  Donnamarie Poag Primary Care Tyvon Eggenberger: [name], Tamala [name] Clinician: Jeanine Luz Referring Troi Florendo: Caroline More Treating Jaynie Hitch/Extender: Skipper Cliche in Treatment: 0 Fall Risk Assessment Items Have you had 2 or more falls in the last 12 monthso 0 Yes Have you had any fall that resulted in injury in the last 12 monthso 0 Yes FALLS RISK SCREEN History of falling - immediate or within 3 months 25 Yes Secondary diagnosis (Do you have 2 or more medical diagnoseso) 0 No Ambulatory aid None/bed rest/wheelchair/nurse 0 No Crutches/cane/walker 15 Yes Furniture 0 No Intravenous therapy Access/Saline/Heparin Lock 0 No Gait/Transferring Normal/  bed rest/ wheelchair 0 No Weak (short steps with or without shuffle, stooped but able to lift head while walking, may 0 No seek support from furniture) Impaired (short steps with shuffle, may have difficulty arising from chair, head down, impaired 20 Yes balance) Mental Status Oriented to own ability 0 Yes Notes states PRN use of HOYER at home-can use walker and can transfer/currently seeing [facility] for PT/OT Electronic Signature(s) Signed: 06/28/2020 4:43:09 PM By: Donnamarie Poag Entered By: Donnamarie Poag on 06/28/2020 14:47:02 [place], [name] ([id number]) -------------------------------------------------------------------------------- Foot Assessment Details Patient Name: [name] Date of Service: 06/28/2020 2:15 PM Medical Record Number: [id number] Patient Account Number: [id number] Date of Birth/Sex: 1951-01-11 (69 y.o. F) Treating RN: Donnamarie Poag Primary Care Ian Cavey: [name], Tamala [name] Clinician: Jeanine Luz Referring Maylani Embree: Caroline More Treating Mordechai Matuszak/Extender: Skipper Cliche in Treatment: 0 Foot Assessment Items Site Locations + = Sensation present, - = Sensation absent, C = Callus, U = Ulcer R = Redness, W = Warmth, M = Maceration, PU = Pre-ulcerative lesion F = Fissure, S = Swelling, D =  Dryness Assessment Right: Left: Other Deformity: No No Prior Foot Ulcer: No No Prior Amputation: No No Charcot Joint: No No Ambulatory Status: Ambulatory With Help Assistance Device: Walker Gait: Administrator, arts) Signed: 06/28/2020 4:43:09 PM By: Donnamarie Poag Entered By: Donnamarie Poag on 06/28/2020 14:47:39 Scheier, [name] ([id number]) -------------------------------------------------------------------------------- Nutrition Risk Screening Details Patient Name: [name] Date of Service: 06/28/2020 2:15 PM Medical Record Number: [id number] Patient Account Number: [id number] Date of Birth/Sex: 1950/10/19 (70 y.o. F) Treating RN: Cornell Barman Primary Care Cayce Quezada: [name], Olivia Mackie Other Clinician: Jeanine Luz Referring Gregory Dowe: Caroline More Treating Sherril Shipman/Extender: Skipper Cliche in Treatment: 0 Height (in): 65.5 Weight (lbs): 240 Body Mass Index (BMI): 39.3 Nutrition Risk Screening Items Score Screening NUTRITION RISK SCREEN: I have an illness or condition that made me change the kind and/or amount of food I eat 0 No I eat fewer than two meals per day 0 No I eat few fruits and vegetables, or milk products 0 No I have three or more drinks of beer, liquor or wine almost every day 0 No I have tooth or mouth problems that make it hard for me to eat 0 No I don't always have enough money to buy the food I need 0 No I eat alone most of the time 0 No I take three or more different prescribed or over-the-counter drugs a day 1 Yes Without wanting to, I have lost or gained 10 pounds in the last six months 0 No I am not always physically able to shop, cook and/or feed myself 2 Yes Nutrition Protocols Good Risk Protocol Moderate Risk Protocol 0 Provide education on nutrition High Risk Proctocol Risk Level: Moderate Risk Score: 3 Electronic Signature(s) Signed: 06/28/2020 3:48:17 PM By: Gretta Cool, BSN, RN, CWS, Kim RN, BSN Entered By: Gretta Cool, BSN, RN, CWS,  Kim on 06/28/2020 15:48:16

## 2020-06-28 NOTE — Progress Notes (Addendum)
[name], [name] ([id number]) Visit Report for 06/28/2020 Chief Complaint Document Details Patient Name: [name] Date of Service: 06/28/2020 2:15 PM Medical Record Number: [id number] Patient Account Number: [id number] Date of Birth/Sex: 11-Aug-1950 (70 y.o. F) Treating RN: Carlene Coria Primary Care Provider: [name], Tamala [name] Clinician: Jeanine Luz Referring Provider: Caroline More Treating Provider/Extender: Skipper Cliche in Treatment: 0 Information Obtained from: Patient Chief Complaint B/L lower extremity ulcers Electronic Signature(s) Signed: 06/28/2020 3:08:30 PM By: Worthy Keeler PA-[name] Entered By: Worthy Keeler on 06/28/2020 15:08:30 [name] ([id number]) -------------------------------------------------------------------------------- Debridement Details Patient Name: [name] Date of Service: 06/28/2020 2:15 PM Medical Record Number: [id number] Patient Account Number: [id number] Date of Birth/Sex: October 29, 1950 (69 y.o. F) Treating RN: Carlene Coria Primary Care Provider: [name], Tamala [name] Clinician: Jeanine Luz Referring Provider: Caroline More Treating Provider/Extender: Skipper Cliche in Treatment: 0 Debridement Performed for Wound #6 Left,Distal Toe Second Assessment: Performed By: Physician Tommie Sams., PA-[name] Debridement Type: Debridement Severity of Tissue Pre Debridement: Fat layer exposed Level of Consciousness (Pre- Awake and Alert procedure): Pre-procedure Verification/Time Out Yes - 15:10 Taken: Start Time: 15:10 Pain Control: Lidocaine 4% Topical Solution Total Area Debrided (L x W): 0.5 (cm) x 0.5 (cm) = 0.25 (cm) Tissue and other material Viable, Non-Viable, Callus, Subcutaneous, Skin: Dermis , Skin: Epidermis debrided: Level: Skin/Subcutaneous Tissue Debridement Description: Excisional Instrument: Curette Bleeding: Minimum Hemostasis Achieved: Pressure End Time: 15:19 Procedural Pain:  0 Post Procedural Pain: 0 Response to Treatment: Procedure was tolerated well Level of Consciousness (Post- Awake and Alert procedure): Post Debridement Measurements of Total Wound Length: (cm) 0.5 Width: (cm) 0.5 Depth: (cm) 0.1 Volume: (cm) 0.02 Character of Wound/Ulcer Post Debridement: Improved Severity of Tissue Post Debridement: Fat layer exposed Post Procedure Diagnosis Same as Pre-procedure Electronic Signature(s) Signed: 06/28/2020 5:24:00 PM By: Worthy Keeler PA-[name] Signed: 07/01/2020 7:55:29 AM By: Carlene Coria RN Entered By: Carlene Coria on 06/28/2020 15:20:01 [name] ([id number]) -------------------------------------------------------------------------------- Debridement Details Patient Name: [name] Date of Service: 06/28/2020 2:15 PM Medical Record Number: [id number] Patient Account Number: [id number] Date of Birth/Sex: Feb 27, 1951 (70 y.o. F) Treating RN: Carlene Coria Primary Care Provider: [name], Tamala [name] Clinician: Jeanine Luz Referring Provider: Caroline More Treating Provider/Extender: Skipper Cliche in Treatment: 0 Debridement Performed for Wound #7 Left,Posterior Calcaneus Assessment: Performed By: Physician Tommie Sams., PA-[name] Debridement Type: Debridement Severity of Tissue Pre Debridement: Fat layer exposed Level of Consciousness (Pre- Awake and Alert procedure): Pre-procedure Verification/Time Out Yes - 15:10 Taken: Start Time: 15:10 Pain Control: Lidocaine 4% Topical Solution Total Area Debrided (L x W): 0.2 (cm) x 0.2 (cm) = 0.04 (cm) Tissue and other material Viable, Non-Viable, Callus, Subcutaneous, Skin: Dermis , Skin: Epidermis debrided: Level: Skin/Subcutaneous Tissue Debridement Description: Excisional Instrument: Curette Bleeding: Minimum Hemostasis Achieved: Pressure End Time: 15:19 Procedural Pain: 0 Post Procedural Pain: 0 Response to Treatment: Procedure was tolerated well Level of  Consciousness (Post- Awake and Alert procedure): Post Debridement Measurements of Total Wound Length: (cm) 0.2 Width: (cm) 0.2 Depth: (cm) 0.1 Volume: (cm) 0.003 Character of Wound/Ulcer Post Debridement: Improved Severity of Tissue Post Debridement: Fat layer exposed Post Procedure Diagnosis Same as Pre-procedure Electronic Signature(s) Signed: 06/28/2020 5:24:00 PM By: Worthy Keeler PA-[name] Signed: 07/01/2020 7:55:29 AM By: Carlene Coria RN Entered By: Carlene Coria on 06/28/2020 15:54:30 [name], [name] ([id number]) -------------------------------------------------------------------------------- HPI Details Patient Name: [name] Date of Service: 06/28/2020 2:15 PM Medical Record Number: [id number] Patient Account Number: [id number] Date of Birth/Sex:  [birth year] (70 y.o. F) Treating RN: Carlene Coria Primary Care Provider: [name], Tamala [name] Clinician: Jeanine Luz Referring Provider: Caroline More Treating Provider/Extender: Skipper Cliche in Treatment: 0 History of Present Illness HPI Description: 06/28/20 upon evaluation today patient appears to be doing unfortunately somewhat poorly in regard to wounds that she has over her lower extremities. She has a wound on the left distal second toe, left posterior heel, and right anterior lower leg. With that being said all in all none of the wounds appear to be too bad the one that hurts her the most is actually the wound on the posterior heel which actually is also one of the smallest. Nonetheless I think there is some callus hiding what is really going on in this area. Fortunately there does not appear to be any obvious evidence of infection which is great news. Patient does have a history of diabetes mellitus type 2, chronic venous insufficiency, hypertension, history of DVTs, and is on long-term anticoagulant therapy, Eliquis. Electronic Signature(s) Signed: 06/28/2020 5:05:05 PM By: Worthy Keeler PA-[name] Entered By:  Worthy Keeler on 06/28/2020 17:05:05 [name], [name] ([id number]) -------------------------------------------------------------------------------- Physical Exam Details Patient Name: [name] Date of Service: 06/28/2020 2:15 PM Medical Record Number: [id number] Patient Account Number: [id number] Date of Birth/Sex: 03-11-1951 (70 y.o. F) Treating RN: Carlene Coria Primary Care Provider: [name], Tamala [name] Clinician: Jeanine Luz Referring Provider: Caroline More Treating Provider/Extender: Skipper Cliche in Treatment: 0 Constitutional sitting or standing blood pressure is within target range for patient.. pulse regular and within target range for patient.Marland Kitchen respirations regular, non- labored and within target range for patient.Marland Kitchen temperature within target range for patient.. Well-nourished and well-hydrated in no acute distress. Eyes conjunctiva clear no eyelid edema noted. pupils equal round and reactive to light and accommodation. Ears, Nose, Mouth, and Throat no gross abnormality of ear auricles or external auditory canals. normal hearing noted during conversation. mucus membranes moist. Respiratory normal breathing without difficulty. Cardiovascular 2+ dorsalis pedis/posterior tibialis pulses. trace pitting edema of the bilateral lower extremities. Musculoskeletal normal gait and posture. no significant deformity or arthritic changes, no loss or range of motion, no clubbing. Psychiatric this patient is able to make decisions and demonstrates good insight into disease process. Alert and Oriented x 3. pleasant and cooperative. Notes Upon inspection patient's wounds did not require significant sharp debridement though I do not deform a little bit in regard to the distal toe as well as the posterior heel. She tolerated these debridements without complication she did have some bleeding but no need for silver nitrate was noted which was great news. Post debridement  wound beds did appear to be doing much better which is great news as well. Electronic Signature(s) Signed: 06/28/2020 5:05:40 PM By: Worthy Keeler PA-[name] Entered By: Worthy Keeler on 06/28/2020 17:05:40 [name], [name] ([id number]) -------------------------------------------------------------------------------- Physician Orders Details Patient Name: [name] Date of Service: 06/28/2020 2:15 PM Medical Record Number: [id number] Patient Account Number: [id number] Date of Birth/Sex: 06/11/1950 (70 y.o. F) Treating RN: Carlene Coria Primary Care Provider: [name], Tamala [name] Clinician: Jeanine Luz Referring Provider: Caroline More Treating Provider/Extender: Skipper Cliche in Treatment: 0 Verbal / Phone Orders: No Diagnosis Coding ICD-10 Coding Code Description E11.621 Type 2 diabetes mellitus with foot ulcer I87.2 Venous insufficiency (chronic) (peripheral) L97.522 Non-pressure chronic ulcer of other part of left foot with fat layer exposed L97.812 Non-pressure chronic ulcer of other part of right lower leg with fat layer exposed L97.322 Non-pressure chronic ulcer  of left ankle with fat layer exposed [place] (primary) hypertension Z86.718 Personal history of other venous thrombosis and embolism Z79.01 Long term (current) use of anticoagulants Follow-up Appointments o Return Appointment in 1 week. [facility] for wound care. May utilize formulary equivalent dressing for wound treatment orders unless otherwise specified. Home Health Nurse may visit PRN to address patientos wound care needs. [facility] Bathing/ Shower/ Hygiene o May shower; gently cleanse wound with antibacterial soap, rinse and pat dry prior to dressing wounds Edema Control - Lymphedema / Segmental Compressive Device / Other o Elevate, Exercise Daily and Avoid Standing for Long Periods of Time. o Elevate legs to the level of the heart and pump ankles as  often as possible o Elevate leg(s) parallel to the floor when sitting. Wound Treatment Wound #6 - Toe Second Wound Laterality: Left, Distal Cleanser: Normal Saline (Generic) 3 x Per Week/30 Days Discharge Instructions: Wash your hands with soap and water. Remove old dressing, discard into plastic bag and place into trash. Cleanse the wound with Normal Saline prior to applying a clean dressing using gauze sponges, not tissues or cotton balls. Do not scrub or use excessive force. Pat dry using gauze sponges, not tissue or cotton balls. Primary Dressing: Prisma 4.34 (in) (Generic) 3 x Per Week/30 Days Discharge Instructions: Moisten w/normal saline or sterile water; Cover wound as directed. Do not remove from wound bed. Secondary Dressing: Coverlet Latex-Free Fabric Adhesive Dressings (Generic) 3 x Per Week/30 Days Discharge Instructions: 1.5 x 2 Wound #7 - Calcaneus Wound Laterality: Left, Posterior Cleanser: Normal Saline (Generic) 3 x Per Week/30 Days Discharge Instructions: Wash your hands with soap and water. Remove old dressing, discard into plastic bag and place into trash. Cleanse the wound with Normal Saline prior to applying a clean dressing using gauze sponges, not tissues or cotton balls. Do not scrub or use excessive force. Pat dry using gauze sponges, not tissue or cotton balls. Primary Dressing: Prisma 4.34 (in) (Generic) 3 x Per Week/30 Days Discharge Instructions: Moisten w/normal saline or sterile water; Cover wound as directed. Do not remove from wound bed. Secondary Dressing: Mepilex Border Flex, 4x4 (in/in) (Generic) 3 x Per Week/30 Days Discharge Instructions: Apply to wound as directed. Do not cut. Wound #8 - Lower Leg Wound Laterality: Right, Anterior Clites, Elide B. ([id number]) Cleanser: Normal Saline (Generic) 3 x Per Week/30 Days Discharge Instructions: Wash your hands with soap and water. Remove old dressing, discard into plastic bag and place into trash. Cleanse  the wound with Normal Saline prior to applying a clean dressing using gauze sponges, not tissues or cotton balls. Do not scrub or use excessive force. Pat dry using gauze sponges, not tissue or cotton balls. Primary Dressing: Prisma 4.34 (in) (Generic) 3 x Per Week/30 Days Discharge Instructions: Moisten w/normal saline or sterile water; Cover wound as directed. Do not remove from wound bed. Secondary Dressing: Mepilex Border Flex, 4x4 (in/in) (Generic) 3 x Per Week/30 Days Discharge Instructions: Apply to wound as directed. Do not cut. Electronic Signature(s) Signed: 06/28/2020 5:24:00 PM By: Worthy Keeler PA-[name] Signed: 07/01/2020 7:55:29 AM By: Carlene Coria RN Entered By: Carlene Coria on 06/28/2020 15:58:30 [name], [name] ([id number]) -------------------------------------------------------------------------------- Problem List Details Patient Name: [name] Date of Service: 06/28/2020 2:15 PM Medical Record Number: [id number] Patient Account Number: [id number] Date of Birth/Sex: [birth year] (70 y.o. F) Treating RN: Carlene Coria Primary Care Provider: [name], Tamala [name] Clinician: Jeanine Luz Referring Provider: Caroline More Treating Provider/Extender: Joaquim Lai,  Vance Gather in Treatment: 0 Active Problems ICD-10 Encounter Code Description Active Date MDM Diagnosis E11.621 Type 2 diabetes mellitus with foot ulcer 06/28/2020 No Yes I87.2 Venous insufficiency (chronic) (peripheral) 06/28/2020 No Yes L97.522 Non-pressure chronic ulcer of other part of left foot with fat layer 06/28/2020 No Yes exposed L97.812 Non-pressure chronic ulcer of other part of right lower leg with fat layer 06/28/2020 No Yes exposed L97.322 Non-pressure chronic ulcer of left ankle with fat layer exposed 06/28/2020 No Yes I10 Essential (primary) hypertension 06/28/2020 No Yes Z86.718 Personal history of other venous thrombosis and embolism 06/28/2020 No Yes Z79.01 Long term (current) use of anticoagulants  06/28/2020 No Yes Inactive Problems Resolved Problems Electronic Signature(s) Signed: 06/28/2020 3:08:14 PM By: Worthy Keeler PA-[name] Entered By: Worthy Keeler on 06/28/2020 15:08:14 Theurer, [name] ([id number]) -------------------------------------------------------------------------------- Progress Note Details Patient Name: [name] Date of Service: 06/28/2020 2:15 PM Medical Record Number: [id number] Patient Account Number: [id number] Date of Birth/Sex: 1951/01/19 (69 y.o. F) Treating RN: Carlene Coria Primary Care Provider: [name], Tamala [name] Clinician: Jeanine Luz Referring Provider: Caroline More Treating Provider/Extender: Skipper Cliche in Treatment: 0 Subjective Chief Complaint Information obtained from Patient B/L lower extremity ulcers History of Present Illness (HPI) 06/28/20 upon evaluation today patient appears to be doing unfortunately somewhat poorly in regard to wounds that she has over her lower extremities. She has a wound on the left distal second toe, left posterior heel, and right anterior lower leg. With that being said all in all none of the wounds appear to be too bad the one that hurts her the most is actually the wound on the posterior heel which actually is also one of the smallest. Nonetheless I think there is some callus hiding what is really going on in this area. Fortunately there does not appear to be any obvious evidence of infection which is great news. Patient does have a history of diabetes mellitus type 2, chronic venous insufficiency, hypertension, history of DVTs, and is on long-term anticoagulant therapy, Eliquis. Patient History Information obtained from Patient. Allergies Penicillins (Severity: Severe, Reaction: hives), Terramycin (Severity: Severe, Reaction: hives), aspirin (Reaction: hives) Family History Cancer - Father, Diabetes - Paternal Grandparents,Father,Siblings, Heart Disease - Maternal Grandparents,  Hypertension - Maternal Grandparents,Father, No family history of Kidney Disease, Lung Disease, Seizures, Stroke, Thyroid Problems. Social History Never smoker, Marital Status - Married, Alcohol Use - Never, Drug Use - No History, Caffeine Use - Rarely. Medical History Respiratory Patient has history of Asthma, Pneumothorax Cardiovascular Patient has history of Arrhythmia, Hypertension Endocrine Patient has history of Type II Diabetes Musculoskeletal Patient has history of Gout, Osteoarthritis Neurologic Patient has history of Neuropathy - Left foot Oncologic Denies history of Received Chemotherapy, Received Radiation Hospitalization/Surgery History - sleeve gastrectomy. Medical And Surgical History Notes Constitutional Symptoms (General Health) Sleeve gastrectomy Gastrointestinal gastric bypass in 11/2012 Review of Systems (ROS) Respiratory Complains or has symptoms of Shortness of Breath. Psychiatric Complains or has symptoms of Anxiety. [name], Dawn B. ([id number]) Objective Constitutional sitting or standing blood pressure is within target range for patient.. pulse regular and within target range for patient.Marland Kitchen respirations regular, non- labored and within target range for patient.Marland Kitchen temperature within target range for patient.. Well-nourished and well-hydrated in no acute distress. Vitals Time Taken: 2:26 PM, Height: 65.5 in, Source: Stated, Weight: 240 lbs, BMI: 39.3, Temperature: 97.8 F, Pulse: 71 bpm, Respiratory Rate: 22 breaths/min, Blood Pressure: 133/81 mmHg. Eyes conjunctiva clear no eyelid edema noted. pupils equal round and reactive to light and  accommodation. Ears, Nose, Mouth, and Throat no gross abnormality of ear auricles or external auditory canals. normal hearing noted during conversation. mucus membranes moist. Respiratory normal breathing without difficulty. Cardiovascular 2+ dorsalis pedis/posterior tibialis pulses. trace pitting edema of the  bilateral lower extremities. Musculoskeletal normal gait and posture. no significant deformity or arthritic changes, no loss or range of motion, no clubbing. Psychiatric this patient is able to make decisions and demonstrates good insight into disease process. Alert and Oriented x 3. pleasant and cooperative. General Notes: Upon inspection patient's wounds did not require significant sharp debridement though I do not deform a little bit in regard to the distal toe as well as the posterior heel. She tolerated these debridements without complication she did have some bleeding but no need for silver nitrate was noted which was great news. Post debridement wound beds did appear to be doing much better which is great news as well. Integumentary (Hair, Skin) Wound #6 status is Open. Original cause of wound was Pressure Injury. The date acquired was: 01/05/2020. The wound is located on the Left,Distal Toe Second. The wound measures 0.5cm length x 0.5cm width x 0.1cm depth; 0.196cm^2 area and 0.02cm^3 volume. Wound #7 status is Open. Original cause of wound was Gradually Appeared. The date acquired was: 03/23/2020. The wound is located on the Left,Posterior Calcaneus. The wound measures 0.2cm length x 0.2cm width x 0.1cm depth; 0.031cm^2 area and 0.003cm^3 volume. There is a none present amount of drainage noted. The wound margin is flat and intact. There is no granulation within the wound bed. There is a large (67- 100%) amount of necrotic tissue within the wound bed including Eschar. Wound #8 status is Open. Original cause of wound was Shear/Friction. The date acquired was: 06/03/2020. The wound is located on the Right,Anterior Lower Leg. The wound measures 1cm length x 0.5cm width x 0.1cm depth; 0.393cm^2 area and 0.039cm^3 volume. There is Fat Layer (Subcutaneous Tissue) exposed. There is no tunneling or undermining noted. There is a small amount of serous drainage noted. There is medium (34-66%)  granulation within the wound bed. There is a medium (34-66%) amount of necrotic tissue within the wound bed including Adherent Slough. Assessment Active Problems ICD-10 Type 2 diabetes mellitus with foot ulcer Venous insufficiency (chronic) (peripheral) Non-pressure chronic ulcer of other part of left foot with fat layer exposed Non-pressure chronic ulcer of other part of right lower leg with fat layer exposed Non-pressure chronic ulcer of left ankle with fat layer exposed Essential (primary) hypertension Personal history of other venous thrombosis and embolism Long term (current) use of anticoagulants Procedures Wound #6 Pre-procedure diagnosis of Wound #6 is a Diabetic Wound/Ulcer of the Lower Extremity located on the Left,Distal Toe Second .Severity of Tissue Pre Debridement is: Fat layer exposed. There was a Excisional Skin/Subcutaneous Tissue Debridement with a total area of 0.25 sq cm performed Minella, Glada B. ([id number]) by Tommie Sams., PA-[name]. With the following instrument(s): Curette to remove Viable and Non-Viable tissue/material. Material removed includes Callus, Subcutaneous Tissue, Skin: Dermis, and Skin: Epidermis after achieving pain control using Lidocaine 4% Topical Solution. No specimens were taken. A time out was conducted at 15:10, prior to the start of the procedure. A Minimum amount of bleeding was controlled with Pressure. The procedure was tolerated well with a pain level of 0 throughout and a pain level of 0 following the procedure. Post Debridement Measurements: 0.5cm length x 0.5cm width x 0.1cm depth; 0.02cm^3 volume. Character of Wound/Ulcer Post Debridement is improved. Severity of  Tissue Post Debridement is: Fat layer exposed. Post procedure Diagnosis Wound #6: Same as Pre-Procedure Wound #7 Pre-procedure diagnosis of Wound #7 is a Diabetic Wound/Ulcer of the Lower Extremity located on the Left,Posterior Calcaneus .Severity of Tissue Pre Debridement is:  Fat layer exposed. There was a Excisional Skin/Subcutaneous Tissue Debridement with a total area of 0.04 sq cm performed by Tommie Sams., PA-[name]. With the following instrument(s): Curette to remove Viable and Non-Viable tissue/material. Material removed includes Callus, Subcutaneous Tissue, Skin: Dermis, and Skin: Epidermis after achieving pain control using Lidocaine 4% Topical Solution. No specimens were taken. A time out was conducted at 15:10, prior to the start of the procedure. A Minimum amount of bleeding was controlled with Pressure. The procedure was tolerated well with a pain level of 0 throughout and a pain level of 0 following the procedure. Post Debridement Measurements: 0.2cm length x 0.2cm width x 0.1cm depth; 0.003cm^3 volume. Character of Wound/Ulcer Post Debridement is improved. Severity of Tissue Post Debridement is: Fat layer exposed. Post procedure Diagnosis Wound #7: Same as Pre-Procedure Plan Follow-up Appointments: Return Appointment in 1 week. Home Health: [facility] for wound care. May utilize formulary equivalent dressing for wound treatment orders unless otherwise specified. Home Health Nurse may visit PRN to address patient s wound care needs. [facility] Bathing/ Shower/ Hygiene: May shower; gently cleanse wound with antibacterial soap, rinse and pat dry prior to dressing wounds Edema Control - Lymphedema / Segmental Compressive Device / Other: Elevate, Exercise Daily and Avoid Standing for Long Periods of Time. Elevate legs to the level of the heart and pump ankles as often as possible Elevate leg(s) parallel to the floor when sitting. WOUND #6: - Toe Second Wound Laterality: Left, Distal Cleanser: Normal Saline (Generic) 3 x Per Week/30 Days Discharge Instructions: Wash your hands with soap and water. Remove old dressing, discard into plastic bag and place into trash. Cleanse the wound with Normal Saline prior to applying a clean dressing using gauze  sponges, not tissues or cotton balls. Do not scrub or use excessive force. Pat dry using gauze sponges, not tissue or cotton balls. Primary Dressing: Prisma 4.34 (in) (Generic) 3 x Per Week/30 Days Discharge Instructions: Moisten w/normal saline or sterile water; Cover wound as directed. Do not remove from wound bed. Secondary Dressing: Coverlet Latex-Free Fabric Adhesive Dressings (Generic) 3 x Per Week/30 Days Discharge Instructions: 1.5 x 2 WOUND #7: - Calcaneus Wound Laterality: Left, Posterior Cleanser: Normal Saline (Generic) 3 x Per Week/30 Days Discharge Instructions: Wash your hands with soap and water. Remove old dressing, discard into plastic bag and place into trash. Cleanse the wound with Normal Saline prior to applying a clean dressing using gauze sponges, not tissues or cotton balls. Do not scrub or use excessive force. Pat dry using gauze sponges, not tissue or cotton balls. Primary Dressing: Prisma 4.34 (in) (Generic) 3 x Per Week/30 Days Discharge Instructions: Moisten w/normal saline or sterile water; Cover wound as directed. Do not remove from wound bed. Secondary Dressing: Mepilex Border Flex, 4x4 (in/in) (Generic) 3 x Per Week/30 Days Discharge Instructions: Apply to wound as directed. Do not cut. WOUND #8: - Lower Leg Wound Laterality: Right, Anterior Cleanser: Normal Saline (Generic) 3 x Per Week/30 Days Discharge Instructions: Wash your hands with soap and water. Remove old dressing, discard into plastic bag and place into trash. Cleanse the wound with Normal Saline prior to applying a clean dressing using gauze sponges, not tissues or cotton balls. Do not scrub or use  excessive force. Pat dry using gauze sponges, not tissue or cotton balls. Primary Dressing: Prisma 4.34 (in) (Generic) 3 x Per Week/30 Days Discharge Instructions: Moisten w/normal saline or sterile water; Cover wound as directed. Do not remove from wound bed. Secondary Dressing: Mepilex Border Flex,  4x4 (in/in) (Generic) 3 x Per Week/30 Days Discharge Instructions: Apply to wound as directed. Do not cut. 1. Would recommend currently that we actually go ahead and initiate treatment with silver collagen to all wound locations. I think this is can be a good option for the patient currently she is in agreement with the plan. 2. I am going to recommend that we have the patient go ahead and perform offloading especially in regard to the heel to try to keep pressure off of this area. 3. I am also going to suggest that the patient continue to monitor for any signs of worsening or infection if anything occurs she should let me know. 4. I am going also recommend that she continue to have regular follow-ups here in the clinic I think that we need to make sure we keep a close eye on things and that she is headed in the correct direction as far as the wounds are concerned. [name], [name] ([id number]) We will see patient back for reevaluation in 1 week here in the clinic. If anything worsens or changes patient will contact our office for additional recommendations. Electronic Signature(s) Signed: 06/28/2020 5:06:48 PM By: Worthy Keeler PA-[name] Entered By: Worthy Keeler on 06/28/2020 17:06:48 [name], [name] ([id number]) -------------------------------------------------------------------------------- ROS/PFSH Details Patient Name: [name] Date of Service: 06/28/2020 2:15 PM Medical Record Number: [id number] Patient Account Number: [id number] Date of Birth/Sex: 07/16/1950 (69 y.o. F) Treating RN: Cornell Barman Primary Care Provider: [name], Tamala [name] Clinician: Jeanine Luz Referring Provider: Caroline More Treating Provider/Extender: Skipper Cliche in Treatment: 0 Information Obtained From Patient Respiratory Complaints and Symptoms: Positive for: Shortness of Breath Medical History: Positive for: Asthma; Pneumothorax Psychiatric Complaints and Symptoms: Positive  for: Anxiety Constitutional Symptoms (General Health) Medical History: Past Medical History Notes: Sleeve gastrectomy Cardiovascular Medical History: Positive for: Arrhythmia; Hypertension Gastrointestinal Medical History: Past Medical History Notes: gastric bypass in 11/2012 Endocrine Medical History: Positive for: Type II Diabetes Time with diabetes: 20 years Treated with: Oral agents, Diet Blood sugar tested every day: Yes Tested : Blood sugar testing results: Breakfast: 122; Bedtime: 146 Musculoskeletal Medical History: Positive for: Gout; Osteoarthritis Neurologic Medical History: Positive for: Neuropathy - Left foot Oncologic Medical History: Negative for: Received Chemotherapy; Received Radiation [name], [name] ([id number]) Immunizations Pneumococcal Vaccine: Received Pneumococcal Vaccination: Yes Implantable Devices No devices added Hospitalization / Surgery History Type of Hospitalization/Surgery sleeve gastrectomy Family and Social History Cancer: Yes - Father; Diabetes: Yes - Paternal Grandparents,Father,Siblings; Heart Disease: Yes - Maternal Grandparents; Hypertension: Yes - Maternal Grandparents,Father; Kidney Disease: No; Lung Disease: No; Seizures: No; Stroke: No; Thyroid Problems: No; Never smoker; Marital Status - Married; Alcohol Use: Never; Drug Use: No History; Caffeine Use: Rarely; Financial Concerns: No; Food, Clothing or Shelter Needs: No; Support System Lacking: No; Transportation Concerns: No Engineer, maintenance) Signed: 06/28/2020 5:04:35 PM By: Gretta Cool, BSN, RN, CWS, Kim RN, BSN Signed: 06/28/2020 5:24:00 PM By: Worthy Keeler PA-[name] Entered By: Gretta Cool, BSN, RN, CWS, Kim on 06/28/2020 15:47:16 [name], CULBRETH ([id number]) -------------------------------------------------------------------------------- SuperBill Details Patient Name: [name] Date of Service: 06/28/2020 Medical Record Number: [id number] Patient Account Number:  [id number] Date of Birth/Sex: 01-07-1951 (70 y.o. F) Treating RN: Carlene Coria Primary Care  Provider: [name], Olivia Mackie Other Clinician: Jeanine Luz Referring Provider: Caroline More Treating Provider/Extender: Skipper Cliche in Treatment: 0 Diagnosis Coding ICD-10 Codes Code Description [phone] Type 2 diabetes mellitus with foot ulcer I87.2 Venous insufficiency (chronic) (peripheral) L97.522 Non-pressure chronic ulcer of other part of left foot with fat layer exposed L97.812 Non-pressure chronic ulcer of other part of right lower leg with fat layer exposed L97.322 Non-pressure chronic ulcer of left ankle with fat layer exposed I10 Essential (primary) hypertension Z86.718 Personal history of other venous thrombosis and embolism Z79.01 Long term (current) use of anticoagulants Facility Procedures CPT4 Code: 01027253 Description: 99213 - WOUND CARE VISIT-LEV 3 EST PT Modifier: 25 Quantity: 1 CPT4 Code: [id number] Description: 11042 - DEB SUBQ TISSUE 20 SQ CM/< Modifier: Quantity: 1 CPT4 Code: Description: ICD-10 Diagnosis Description L97.522 Non-pressure chronic ulcer of other part of left foot with fat layer expo Modifier: sed Quantity: Physician Procedures CPT4 Code: 4259563 Description: WC PHYS LEVEL 3 o NEW PT Modifier: 25 Quantity: 1 CPT4 Code: Description: ICD-10 Diagnosis Description E11.621 Type 2 diabetes mellitus with foot ulcer I87.2 Venous insufficiency (chronic) (peripheral) L97.522 Non-pressure chronic ulcer of other part of left foot with fat layer expo L97.812 Non-pressure chronic  ulcer of other part of right lower leg with fat laye Modifier: sed r exposed Quantity: CPT4 Code: [id number] Description: 11042 - WC PHYS SUBQ TISS 20 SQ CM Modifier: Quantity: 1 CPT4 Code: Description: ICD-10 Diagnosis Description L97.522 Non-pressure chronic ulcer of other part of left foot with fat layer expo Modifier: sed Quantity: Electronic Signature(s) Signed:  06/28/2020 5:07:15 PM By: Worthy Keeler PA-[name] Entered By: Worthy Keeler on 06/28/2020 17:07:14

## 2020-07-01 NOTE — Progress Notes (Signed)
[name] ([id number]) Visit Report for 06/28/2020 Allergy List Details Patient Name: [name], [name] Date of Service: 06/28/2020 2:15 PM Medical Record Number: [id number] Patient Account Number: [id number] Date of Birth/Sex: [birth year] (70 y.o. F) Treating RN: Donnamarie Poag Primary Care Ondria Oswald: [name], Tamala [name] Clinician: Jeanine Luz Referring Sheniece Ruggles: Caroline More Treating Excell Neyland/Extender: Jeri Cos Weeks in Treatment: 0 Allergies Active Allergies Penicillins Reaction: hives Severity: Severe Active: 01/23/2013 Terramycin Reaction: hives Severity: Severe Active: 01/23/2013 aspirin Reaction: hives Active: 01/23/2013 Allergy Notes Electronic Signature(s) Signed: 06/28/2020 4:43:09 PM By: Donnamarie Poag Entered By: Donnamarie Poag on 06/28/2020 14:44:47 Mitzel, [name] ([id number]) -------------------------------------------------------------------------------- Arrival Information Details Patient Name: [name] Date of Service: 06/28/2020 2:15 PM Medical Record Number: [id number] Patient Account Number: [id number] Date of Birth/Sex: 12/13/1950 (70 y.o. F) Treating RN: Donnamarie Poag Primary Care Xylina Rhoads: [name], Tamala [name] Clinician: Jeanine Luz Referring Lankford Gutzmer: Caroline More Treating Caleah Tortorelli/Extender: Skipper Cliche in Treatment: 0 Visit Information Patient Arrived: Wheel Chair Arrival Time: 14:21 Accompanied By: husband Transfer Assistance: Manual Patient Identification Verified: Yes Secondary Verification Process Completed: Yes History Since Last Visit Electronic Signature(s) Signed: 06/28/2020 4:43:09 PM By: Donnamarie Poag Entered By: Donnamarie Poag on 06/28/2020 14:55:59 [place]erby, [name] ([id number]) -------------------------------------------------------------------------------- Clinic Level of Care Assessment Details Patient Name: [name] Date of Service: 06/28/2020 2:15 PM Medical Record Number: [id number] Patient  Account Number: [id number] Date of Birth/Sex: [birth year] (70 y.o. F) Treating RN: Carlene Coria Primary Care Shakiera Edelson: [name], Tamala [name] Clinician: Jeanine Luz Referring Idrissa Beville: Caroline More Treating Bookert Guzzi/Extender: Skipper Cliche in Treatment: 0 Clinic Level of Care Assessment Items TOOL 1 Quantity Score X - Use when EandM and Procedure is performed on INITIAL visit 1 0 ASSESSMENTS - Nursing Assessment / Reassessment X - General Physical Exam (combine w/ comprehensive assessment (listed just below) when performed on new 1 20 pt. evals) X- 1 25 Comprehensive Assessment (HX, ROS, Risk Assessments, Wounds Hx, etc.) ASSESSMENTS - Wound and Skin Assessment / Reassessment []  - Dermatologic / Skin Assessment (not related to wound area) 0 ASSESSMENTS - Ostomy and/or Continence Assessment and Care []  - Incontinence Assessment and Management 0 []  - 0 Ostomy Care Assessment and Management (repouching, etc.) PROCESS - Coordination of Care X - Simple Patient / Family Education for ongoing care 1 15 []  - 0 Complex (extensive) Patient / Family Education for ongoing care []  - 0 Staff obtains Programmer, systems, Records, Test Results / Process Orders []  - 0 Staff telephones HHA, Nursing Homes / Clarify orders / etc []  - 0 Routine Transfer to another Facility (non-emergent condition) []  - 0 Routine Hospital Admission (non-emergent condition) X- 1 15 New Admissions / Biomedical engineer / Ordering NPWT, Apligraf, etc. []  - 0 Emergency Hospital Admission (emergent condition) PROCESS - Special Needs []  - Pediatric / Minor Patient Management 0 []  - 0 Isolation Patient Management []  - 0 Hearing / Language / Visual special needs []  - 0 Assessment of Community assistance (transportation, D/C planning, etc.) []  - 0 Additional assistance / Altered mentation []  - 0 Support Surface(s) Assessment (bed, cushion, seat, etc.) INTERVENTIONS - Miscellaneous []  - External ear exam  0 []  - 0 Patient Transfer (multiple staff / Civil Service fast streamer / Similar devices) []  - 0 Simple Staple / Suture removal (25 or less) []  - 0 Complex Staple / Suture removal (26 or more) []  - 0 Hypo/Hyperglycemic Management (do not check if billed separately) X- 1 15 Ankle / Brachial Index (ABI) - do not check if billed separately Has the patient been  seen at the hospital within the last three years: Yes Total Score: 90 Level Of Care: New/Established - Level 3 [name], Dawn B. ([id number]) Electronic Signature(s) Signed: 07/01/2020 7:55:29 AM By: Carlene Coria RN Entered By: Carlene Coria on 06/28/2020 16:16:10 [place]lett, [name] ([id number]) -------------------------------------------------------------------------------- Encounter Discharge Information Details Patient Name: [name] Date of Service: 06/28/2020 2:15 PM Medical Record Number: [id number] Patient Account Number: [id number] Date of Birth/Sex: 1950-11-21 (70 y.o. F) Treating RN: Carlene Coria Primary Care Kimia Finan: [name], Tamala [name] Clinician: Jeanine Luz Referring Laelyn Blumenthal: Caroline More Treating [name]/Extender: Skipper Cliche in Treatment: 0 Encounter Discharge Information Items Post Procedure Vitals Discharge Condition: Stable Temperature (F): 97.8 Ambulatory Status: Wheelchair Pulse (bpm): 71 Discharge Destination: Home Respiratory Rate (breaths/min): 22 Transportation: Private Auto Blood Pressure (mmHg): 133/81 Accompanied By: husband Schedule Follow-up Appointment: Yes Clinical Summary of Care: Patient Declined Electronic Signature(s) Signed: 07/01/2020 7:55:29 AM By: Carlene Coria RN Entered By: Carlene Coria on 06/28/2020 [place], [name] ([id number]) -------------------------------------------------------------------------------- Lower Extremity Assessment Details Patient Name: [name] Date of Service: 06/28/2020 2:15 PM Medical Record Number: [id number] Patient Account  Number: [id number] Date of Birth/Sex: 1950-11-10 (70 y.o. F) Treating RN: Donnamarie Poag Primary Care Gwyneth Fernandez: [name], Tamala [name] Clinician: Jeanine Luz Referring Bethsaida Siegenthaler: Caroline More Treating Nissi Doffing/Extender: Skipper Cliche in Treatment: 0 Edema Assessment Assessed: [Left: Yes] [Right: Yes] Edema: [Left: No] [Right: No] Calf Left: Right: Point of Measurement: 30 cm From Medial Instep 30.5 cm 30.5 cm Ankle Left: Right: Point of Measurement: 10 cm From Medial Instep 16 cm 17.5 cm Vascular Assessment Pulses: Dorsalis Pedis Palpable: [Left:Yes] [Right:Yes] Notes ABI done 06/26/20 at [facility] Electronic Signature(s) Signed: 06/28/2020 4:43:09 PM By: Donnamarie Poag Entered By: Donnamarie Poag on 06/28/2020 14:49:24 [place]az[place], [name] ([id number]) -------------------------------------------------------------------------------- Multi Wound Chart Details Patient Name: [name] Date of Service: 06/28/2020 2:15 PM Medical Record Number: [id number] Patient Account Number: [id number] Date of Birth/Sex: Dec 15, 1950 (69 y.o. F) Treating RN: Carlene Coria Primary Care Welborn Keena: [name], Tamala [name] Clinician: Jeanine Luz Referring Tessah Patchen: Caroline More Treating Ruweyda Macknight/Extender: Skipper Cliche in Treatment: 0 Vital Signs Height(in): 65.5 Pulse(bpm): 71 Weight(lbs): 240 Blood Pressure(mmHg): 133/81 Body Mass Index(BMI): 39 Temperature(F): 97.8 Respiratory Rate(breaths/min): 22 Photos: [6:No Photos] Wound Location: Left, Distal Toe Second Left, Posterior Achilles Right, Anterior Lower Leg Wounding Event: Pressure Injury Not Known Shear/Friction Primary Etiology: Diabetic Wound/Ulcer of the Lower To be determined Trauma, Other Extremity Date Acquired: 01/05/2020 03/16/2020 06/03/2020 Weeks of Treatment: 0 0 0 Wound Status: Open Open Open Measurements L x W x D (cm) 0.5x0.5x0.1 2.5x0.5x0.1 1x0.5x0.1 Area (cm) : 0.196 0.982 0.393 Volume (cm)  : 0.02 0.098 0.039 Classification: N/A Full Thickness Without Exposed Full Thickness Without Exposed Support Structures Support Structures Exudate Amount: N/A None Present Small Exudate Type: N/A N/A Serous Exudate Color: N/A N/A amber Granulation Amount: N/A None Present (0%) Medium (34-66%) Necrotic Amount: N/A Large (67-100%) Medium (34-66%) Necrotic Tissue: N/A Eschar Adherent Slough Epithelialization: N/A None Small (1-33%) Treatment Notes Electronic Signature(s) Signed: 07/01/2020 7:55:29 AM By: Carlene Coria RN Entered By: Carlene Coria on 06/28/2020 15:17:16 [name] ([id number]) -------------------------------------------------------------------------------- [place]l Details Patient Name: [name] Date of Service: 06/28/2020 2:15 PM Medical Record Number: [id number] Patient Account Number: [id number] Date of Birth/Sex: 1950/09/03 (70 y.o. F) Treating RN: Carlene Coria Primary Care Garcia Dalzell: [name], Tamala [name] Clinician: Jeanine Luz Referring Garrell Flagg: Caroline More Treating Bobbette Eakes/Extender: Skipper Cliche in Treatment: 0 Active Inactive Abuse / Safety / Falls / Pulcifer  Diagnoses: Potential for injury related to falls Goals: Patient will remain injury free related to falls Date Initiated: 06/28/2020 Target Resolution Date: 07/28/2020 Goal Status: Active Interventions: Assess Activities of Daily Living upon admission and as needed Assess fall risk on admission and as needed Assess: immobility, friction, shearing, incontinence upon admission and as needed Assess impairment of mobility on admission and as needed per policy Assess personal safety and home safety (as indicated) on admission and as needed Assess self care needs on admission and as needed Notes: Nutrition Nursing Diagnoses: Impaired glucose control: actual or potential Goals: Patient/caregiver verbalizes understanding of need to maintain  therapeutic glucose control per primary care physician Date Initiated: 06/28/2020 Target Resolution Date: 07/28/2020 Goal Status: Active Interventions: Assess HgA1c results as ordered upon admission and as needed Assess patient nutrition upon admission and as needed per policy Notes: Wound/Skin Impairment Nursing Diagnoses: Knowledge deficit related to ulceration/compromised skin integrity Goals: Patient/caregiver will verbalize understanding of skin care regimen Date Initiated: 06/28/2020 Target Resolution Date: 07/28/2020 Goal Status: Active Ulcer/skin breakdown will have a volume reduction of 30% by week 4 Date Initiated: 06/28/2020 Target Resolution Date: 07/28/2020 Goal Status: Active Ulcer/skin breakdown will have a volume reduction of 50% by week 8 Date Initiated: 06/28/2020 Target Resolution Date: 08/28/2020 Goal Status: Active Ulcer/skin breakdown will have a volume reduction of 80% by week 12 Date Initiated: 06/28/2020 Target Resolution Date: 09/27/2020 RAELEY, GILMORE ([id number]) Goal Status: Active Ulcer/skin breakdown will heal within 14 weeks Date Initiated: 06/28/2020 Target Resolution Date: 10/28/2020 Goal Status: Active Interventions: Assess patient/caregiver ability to obtain necessary supplies Assess patient/caregiver ability to perform ulcer/skin care regimen upon admission and as needed Assess ulceration(s) every visit Notes: Electronic Signature(s) Signed: 07/01/2020 7:55:29 AM By: Carlene Coria RN Entered By: Carlene Coria on 06/28/2020 15:17:01 [name] ([id number]) -------------------------------------------------------------------------------- Pain Assessment Details Patient Name: [name] Date of Service: 06/28/2020 2:15 PM Medical Record Number: [id number] Patient Account Number: [id number] Date of Birth/Sex: 1950/12/18 (70 y.o. F) Treating RN: Donnamarie Poag Primary Care Shakari Qazi: [name], Tamala [name] Clinician: Jeanine Luz Referring  Shakeeta Godette: Caroline More Treating Hakiem Malizia/Extender: Skipper Cliche in Treatment: 0 Active Problems Location of Pain Severity and Description of Pain Patient Has Paino Yes Site Locations Pain Location: Pain in Ulcers Rate the pain. Current Pain Level: 5 Pain Management and Medication Current Pain Management: Electronic Signature(s) Signed: 06/28/2020 4:43:09 PM By: Donnamarie Poag Entered By: Donnamarie Poag on 06/28/2020 14:25:50 Pyle, [name] ([id number]) -------------------------------------------------------------------------------- Patient/Caregiver Education Details Patient Name: [name] Date of Service: 06/28/2020 2:15 PM Medical Record Number: [id number] Patient Account Number: [id number] Date of Birth/Gender: 1950-10-07 (69 y.o. F) Treating RN: Carlene Coria Primary Care Physician: [name], Olivia Mackie Other Clinician: Jeanine Luz Referring Physician: Caroline More Treating Physician/Extender: Skipper Cliche in Treatment: 0 Education Assessment Education Provided To: Patient Education Topics Provided Wound/Skin Impairment: Methods: Explain/Verbal Responses: State content correctly Electronic Signature(s) Signed: 07/01/2020 7:55:29 AM By: Carlene Coria RN Entered By: Carlene Coria on 06/28/2020 16:16:47 [place], [name] ([id number]) -------------------------------------------------------------------------------- Wound Assessment Details Patient Name: [name] Date of Service: 06/28/2020 2:15 PM Medical Record Number: [id number] Patient Account Number: [id number] Date of Birth/Sex: 08-16-1950 (70 y.o. F) Treating RN: Donnamarie Poag Primary Care Kinda Pottle: [name], Tamala [name] Clinician: Jeanine Luz Referring Kazuo Durnil: Caroline More Treating Loney Peto/Extender: Skipper Cliche in Treatment: 0 Wound Status Wound Number: 6 Primary Etiology: Diabetic Wound/Ulcer of the Lower Extremity Wound Location: Left, Distal Toe Second Wound Status:  Open Wounding Event: Pressure Injury Date Acquired: 01/05/2020 Weeks  Of Treatment: 0 Clustered Wound: No Wound Measurements Length: (cm) Width: (cm) Depth: (cm) Area: (cm) Volume: (cm) 0.5 % Reduction in Area: 0.5 % Reduction in Volume: 0.1 0.196 0.02 Treatment Notes Wound #6 (Toe Second) Wound Laterality: Left, Distal Cleanser Normal Saline Discharge Instruction: Wash your hands with soap and water. Remove old dressing, discard into plastic bag and place into trash. Cleanse the wound with Normal Saline prior to applying a clean dressing using gauze sponges, not tissues or cotton balls. Do not scrub or use excessive force. Pat dry using gauze sponges, not tissue or cotton balls. Peri-Wound Care Topical Primary Dressing Prisma 4.34 (in) Discharge Instruction: Moisten w/normal saline or sterile water; Cover wound as directed. Do not remove from wound bed. Secondary Dressing Coverlet Latex-Free Fabric Adhesive Dressings Discharge Instruction: 1.5 x 2 Secured With Compression Wrap Compression Stockings Add-Ons Electronic Signature(s) Signed: 06/28/2020 4:43:09 PM By: Donnamarie Poag Entered By: Donnamarie Poag on 06/28/2020 14:49:46 [place], [name] ([id number]) -------------------------------------------------------------------------------- Wound Assessment Details Patient Name: [name] Date of Service: 06/28/2020 2:15 PM Medical Record Number: [id number] Patient Account Number: [id number] Date of Birth/Sex: Apr 02, 1950 (69 y.o. F) Treating RN: Cornell Barman Primary Care Jenavieve Freda: [name], Olivia Mackie Other Clinician: Jeanine Luz Referring Javaeh Muscatello: Caroline More Treating Tanikka Bresnan/Extender: Skipper Cliche in Treatment: 0 Wound Status Wound Number: 7 Primary Diabetic Wound/Ulcer of the Lower Extremity Etiology: Wound Location: Left, Posterior Calcaneus Wound Open Wounding Event: Gradually Appeared Status: Date Acquired: 03/23/2020 Comorbid Asthma, Pneumothorax,  Arrhythmia, Hypertension, Type Weeks Of Treatment: 0 History: II Diabetes, Gout, Osteoarthritis, Neuropathy Clustered Wound: No Wound Measurements Length: (cm) 0.2 Width: (cm) 0.2 Depth: (cm) 0.1 Area: (cm) 0.031 Volume: (cm) 0.003 % Reduction in Area: 0% % Reduction in Volume: 0% Epithelialization: None Wound Description Classification: Grade 1 Wound Margin: Flat and Intact Exudate Amount: None Present Foul Odor After Cleansing: No Slough/Fibrino No Wound Bed Granulation Amount: None Present (0%) Exposed Structure Necrotic Amount: Large (67-100%) Fascia Exposed: No Necrotic Quality: Eschar Fat Layer (Subcutaneous Tissue) Exposed: No Tendon Exposed: No Muscle Exposed: No Joint Exposed: No Bone Exposed: No Treatment Notes Wound #7 (Calcaneus) Wound Laterality: Left, Posterior Cleanser Normal Saline Discharge Instruction: Wash your hands with soap and water. Remove old dressing, discard into plastic bag and place into trash. Cleanse the wound with Normal Saline prior to applying a clean dressing using gauze sponges, not tissues or cotton balls. Do not scrub or use excessive force. Pat dry using gauze sponges, not tissue or cotton balls. Peri-Wound Care Topical Primary Dressing Prisma 4.34 (in) Discharge Instruction: Moisten w/normal saline or sterile water; Cover wound as directed. Do not remove from wound bed. Secondary Dressing Mepilex Border Flex, 4x4 (in/in) Discharge Instruction: Apply to wound as directed. Do not cut. Secured With Compression Wrap Compression Stockings KASANDRA, FEHR ([id number]) Add-Ons Electronic Signature(s) Signed: 06/28/2020 3:50:16 PM By: Gretta Cool, BSN, RN, CWS, Kim RN, BSN Entered By: Gretta Cool, BSN, RN, CWS, Kim on 06/28/2020 15:50:16 FAWNA, CRANMER ([id number]) -------------------------------------------------------------------------------- Wound Assessment Details Patient Name: [name] Date of Service: 06/28/2020 2:15  PM Medical Record Number: [id number] Patient Account Number: [id number] Date of Birth/Sex: September 26, 1950 (69 y.o. F) Treating RN: Donnamarie Poag Primary Care Isamar Nazir: [name], Tamala [name] Clinician: Jeanine Luz Referring Sarah Zerby: Caroline More Treating Asherah Lavoy/Extender: Skipper Cliche in Treatment: 0 Wound Status Wound Number: 8 Primary Etiology: Trauma, Other Wound Location: Right, Anterior Lower Leg Wound Status: Open Wounding Event: Shear/Friction Date Acquired: 06/03/2020 Weeks Of Treatment: 0 Clustered Wound: No Photos Wound Measurements Length: (cm) 1  Width: (cm) 0.5 Depth: (cm) 0.1 Area: (cm) 0.393 Volume: (cm) 0.039 % Reduction in Area: % Reduction in Volume: Epithelialization: Small (1-33%) Tunneling: No Undermining: No Wound Description Classification: Full Thickness Without Exposed Support Structures Exudate Amount: Small Exudate Type: Serous Exudate Color: amber Foul Odor After Cleansing: No Slough/Fibrino Yes Wound Bed Granulation Amount: Medium (34-66%) Exposed Structure Necrotic Amount: Medium (34-66%) Fascia Exposed: No Necrotic Quality: Adherent Slough Fat Layer (Subcutaneous Tissue) Exposed: Yes Tendon Exposed: No Muscle Exposed: No Joint Exposed: No Bone Exposed: No Treatment Notes Wound #8 (Lower Leg) Wound Laterality: Right, Anterior Cleanser Normal Saline Discharge Instruction: Wash your hands with soap and water. Remove old dressing, discard into plastic bag and place into trash. Cleanse the wound with Normal Saline prior to applying a clean dressing using gauze sponges, not tissues or cotton balls. Do not scrub or use excessive force. Pat dry using gauze sponges, not tissue or cotton balls. AMAIA, LAVALLIE ([id number]) Peri-Wound Care Topical Primary Dressing Prisma 4.34 (in) Discharge Instruction: Moisten w/normal saline or sterile water; Cover wound as directed. Do not remove from wound bed. Secondary Dressing Mepilex  Border Flex, 4x4 (in/in) Discharge Instruction: Apply to wound as directed. Do not cut. Secured With Compression Wrap Compression Stockings Add-Ons Electronic Signature(s) Signed: 06/28/2020 4:43:09 PM By: Donnamarie Poag Entered By: Donnamarie Poag on 06/28/2020 14:54:09 [name] ([id number]) -------------------------------------------------------------------------------- Vitals Details Patient Name: [name] Date of Service: 06/28/2020 2:15 PM Medical Record Number: [id number] Patient Account Number: [id number] Date of Birth/Sex: Jul 24, 1950 (70 y.o. F) Treating RN: Donnamarie Poag Primary Care Mindel Friscia: [name], Tamala [name] Clinician: Jeanine Luz Referring Rayburn Mundis: Caroline More Treating Jaret Coppedge/Extender: Skipper Cliche in Treatment: 0 Vital Signs Time Taken: 14:26 Temperature (F): 97.8 Height (in): 65.5 Pulse (bpm): 71 Source: Stated Respiratory Rate (breaths/min): 22 Weight (lbs): 240 Blood Pressure (mmHg): 133/81 Body Mass Index (BMI): 39.3 Reference Range: 80 - 120 mg / dl Electronic Signature(s) Signed: 06/28/2020 4:43:09 PM By: Donnamarie Poag Entered ByDonnamarie Poag on 06/28/2020 14:28:25

## 2020-07-02 ENCOUNTER — Telehealth: Payer: Self-pay | Admitting: Licensed Clinical Social Worker

## 2020-07-02 NOTE — Telephone Encounter (Signed)
LCSW received a call from pt spouse, [name]. He shares that pt having some challenges w/ one of her shoulders/arms and inquired if this Probation officer had a recommendation for an orthopaedic office. Shared that I cannot give specific recommendations as provider choice is based on different things for different people. LCSW shared that I would recommend that pt get in touch with her PCP office. Pt spouse shared they had previously had orthopaedic interventions- I encouraged her to reach out to those previous surgeons to see if they have further guidance/feedback. Pt spouse state understanding  Pt spouse shares that he would like to get pt out of the house as able. LCSW has sent them additional information about courses/events at [facility], reminded spouse to reach out if any additional questions/concerns.   Westley Hummer, MSW, [place]  [phone]

## 2020-07-03 DIAGNOSIS — I10 Essential (primary) hypertension: Secondary | ICD-10-CM | POA: Diagnosis not present

## 2020-07-03 DIAGNOSIS — I69398 Other sequelae of cerebral infarction: Secondary | ICD-10-CM | POA: Diagnosis not present

## 2020-07-03 DIAGNOSIS — E1142 Type 2 diabetes mellitus with diabetic polyneuropathy: Secondary | ICD-10-CM | POA: Diagnosis not present

## 2020-07-03 DIAGNOSIS — J45909 Unspecified asthma, uncomplicated: Secondary | ICD-10-CM | POA: Diagnosis not present

## 2020-07-03 DIAGNOSIS — I480 Paroxysmal atrial fibrillation: Secondary | ICD-10-CM | POA: Diagnosis not present

## 2020-07-03 DIAGNOSIS — I69354 Hemiplegia and hemiparesis following cerebral infarction affecting left non-dominant side: Secondary | ICD-10-CM | POA: Diagnosis not present

## 2020-07-03 DIAGNOSIS — K1379 Other lesions of oral mucosa: Secondary | ICD-10-CM | POA: Diagnosis not present

## 2020-07-03 DIAGNOSIS — E1151 Type 2 diabetes mellitus with diabetic peripheral angiopathy without gangrene: Secondary | ICD-10-CM | POA: Diagnosis not present

## 2020-07-03 DIAGNOSIS — I872 Venous insufficiency (chronic) (peripheral): Secondary | ICD-10-CM | POA: Diagnosis not present

## 2020-07-03 NOTE — Telephone Encounter (Signed)
Faxed back signed order number [id number] for PT DC to [facility] at [phone] on 06/28/20

## 2020-07-05 ENCOUNTER — Other Ambulatory Visit: Payer: Self-pay

## 2020-07-05 ENCOUNTER — Encounter: Payer: Medicare HMO | Admitting: Physician Assistant

## 2020-07-05 DIAGNOSIS — E1151 Type 2 diabetes mellitus with diabetic peripheral angiopathy without gangrene: Secondary | ICD-10-CM | POA: Diagnosis not present

## 2020-07-05 DIAGNOSIS — Z7901 Long term (current) use of anticoagulants: Secondary | ICD-10-CM | POA: Diagnosis not present

## 2020-07-05 DIAGNOSIS — L97812 Non-pressure chronic ulcer of other part of right lower leg with fat layer exposed: Secondary | ICD-10-CM | POA: Diagnosis not present

## 2020-07-05 DIAGNOSIS — I10 Essential (primary) hypertension: Secondary | ICD-10-CM | POA: Diagnosis not present

## 2020-07-05 DIAGNOSIS — E11621 Type 2 diabetes mellitus with foot ulcer: Secondary | ICD-10-CM | POA: Diagnosis not present

## 2020-07-05 DIAGNOSIS — L97422 Non-pressure chronic ulcer of left heel and midfoot with fat layer exposed: Secondary | ICD-10-CM | POA: Diagnosis not present

## 2020-07-05 DIAGNOSIS — L97522 Non-pressure chronic ulcer of other part of left foot with fat layer exposed: Secondary | ICD-10-CM | POA: Diagnosis not present

## 2020-07-05 DIAGNOSIS — I872 Venous insufficiency (chronic) (peripheral): Secondary | ICD-10-CM | POA: Diagnosis not present

## 2020-07-05 DIAGNOSIS — E1140 Type 2 diabetes mellitus with diabetic neuropathy, unspecified: Secondary | ICD-10-CM | POA: Diagnosis not present

## 2020-07-05 DIAGNOSIS — L97322 Non-pressure chronic ulcer of left ankle with fat layer exposed: Secondary | ICD-10-CM | POA: Diagnosis not present

## 2020-07-05 DIAGNOSIS — Z86718 Personal history of other venous thrombosis and embolism: Secondary | ICD-10-CM | POA: Diagnosis not present

## 2020-07-05 NOTE — Progress Notes (Addendum)
[name], [name] ([id number]) Visit Report for 07/05/2020 Arrival Information Details Patient Name: [name], [name] Date of Service: 07/05/2020 3:30 PM Medical Record Number: [id number] Patient Account Number: [id number] Date of Birth/Sex: [birth year] (70 y.o. F) Treating RN: Carlene Coria Primary Care Renne Platts: [name], Tamala [name] Clinician: Jeanine Luz Referring Kyree Fedorko: [name], Olivia Mackie Treating Courtany Mcmurphy/Extender: Skipper Cliche in Treatment: 1 Visit Information History Since Last Visit Added or deleted any medications: No Patient Arrived: Wheel Chair Had a fall or experienced change in No Arrival Time: 15:52 activities of daily living that may affect Accompanied By: husband risk of falls: Transfer Assistance: EasyPivot Patient Lift Hospitalized since last visit: No Patient Identification Verified: Yes Pain Present Now: Yes Secondary Verification Process Completed: Yes Electronic Signature(s) Signed: 07/05/2020 4:49:43 PM By: Jeanine Luz Entered By: Jeanine Luz on 07/05/2020 15:53:17 [name], [name] ([id number]) -------------------------------------------------------------------------------- Clinic Level of Care Assessment Details Patient Name: [name] Date of Service: 07/05/2020 3:30 PM Medical Record Number: [id number] Patient Account Number: [id number] Date of Birth/Sex: 24-Jan-1951 (70 y.o. F) Treating RN: Carlene Coria Primary Care Akiem Urieta: [name], Tamala [name] Clinician: Jeanine Luz Referring Draven Natter: [name], Olivia Mackie Treating Veera Stapleton/Extender: Skipper Cliche in Treatment: 1 Clinic Level of Care Assessment Items TOOL 1 Quantity Score []  - Use when EandM and Procedure is performed on INITIAL visit 0 ASSESSMENTS - Nursing Assessment / Reassessment []  - General Physical Exam (combine w/ comprehensive assessment (listed just below) when performed on new 0 pt. evals) []  - 0 Comprehensive Assessment (HX, ROS,  Risk Assessments, Wounds Hx, etc.) ASSESSMENTS - Wound and Skin Assessment / Reassessment []  - Dermatologic / Skin Assessment (not related to wound area) 0 ASSESSMENTS - Ostomy and/or Continence Assessment and Care []  - Incontinence Assessment and Management 0 []  - 0 Ostomy Care Assessment and Management (repouching, etc.) PROCESS - Coordination of Care []  - Simple Patient / Family Education for ongoing care 0 []  - 0 Complex (extensive) Patient / Family Education for ongoing care []  - 0 Staff obtains Programmer, systems, Records, Test Results / Process Orders []  - 0 Staff telephones HHA, Nursing Homes / Clarify orders / etc []  - 0 Routine Transfer to another Facility (non-emergent condition) []  - 0 Routine Hospital Admission (non-emergent condition) []  - 0 New Admissions / Biomedical engineer / Ordering NPWT, Apligraf, etc. []  - 0 Emergency Hospital Admission (emergent condition) PROCESS - Special Needs []  - Pediatric / Minor Patient Management 0 []  - 0 Isolation Patient Management []  - 0 Hearing / Language / Visual special needs []  - 0 Assessment of Community assistance (transportation, D/C planning, etc.) []  - 0 Additional assistance / Altered mentation []  - 0 Support Surface(s) Assessment (bed, cushion, seat, etc.) INTERVENTIONS - Miscellaneous []  - External ear exam 0 []  - 0 Patient Transfer (multiple staff / Civil Service fast streamer / Similar devices) []  - 0 Simple Staple / Suture removal (25 or less) []  - 0 Complex Staple / Suture removal (26 or more) []  - 0 Hypo/Hyperglycemic Management (do not check if billed separately) []  - 0 Ankle / Brachial Index (ABI) - do not check if billed separately Has the patient been seen at the hospital within the last three years: Yes Total Score: 0 Level Of Care: ____ [name] ([id number]) Electronic Signature(s) Signed: 07/08/2020 7:55:11 AM By: Carlene Coria RN Entered By: Carlene Coria on 07/05/2020 16:39:21 [name], [name]  ([id number]) -------------------------------------------------------------------------------- Encounter Discharge Information Details Patient Name: [name] Date of Service: 07/05/2020 3:30 PM Medical Record Number: [id number] Patient Account Number: [id number] Date  of Birth/Sex: [birth year] (70 y.o. F) Treating RN: Dolan Amen Primary Care Makalah Asberry: [name], Olivia Mackie Other Clinician: Jeanine Luz Referring Ayush Boulet: [name], Olivia Mackie Treating Tamarius Rosenfield/Extender: Skipper Cliche in Treatment: 1 Encounter Discharge Information Items Post Procedure Vitals Discharge Condition: Stable Temperature (F): 98.1 Ambulatory Status: Walker Pulse (bpm): 66 Discharge Destination: Home Respiratory Rate (breaths/min): 16 Transportation: Private Auto Blood Pressure (mmHg): 130/76 Accompanied By: husband Schedule Follow-up Appointment: Yes Clinical Summary of Care: Electronic Signature(s) Signed: 07/05/2020 4:55:01 PM By: Georges Mouse, Minus Breeding RN Entered By: Georges Mouse, Minus Breeding on 07/05/2020 16:55:01 [name] ([id number]) -------------------------------------------------------------------------------- Lower Extremity Assessment Details Patient Name: [name] Date of Service: 07/05/2020 3:30 PM Medical Record Number: [id number] Patient Account Number: [id number] Date of Birth/Sex: [birth year] (70 y.o. F) Treating RN: Carlene Coria Primary Care Calia Napp: [name], Tamala [name] Clinician: Jeanine Luz Referring Kadan Millstein: [name], Olivia Mackie Treating Tobey Lippard/Extender: Skipper Cliche in Treatment: 1 Edema Assessment Assessed: [Left: No] [Right: Yes] Edema: [Left: N] [Right: o] Calf Left: Right: Point of Measurement: 30 cm From Medial Instep 30.2 cm Ankle Left: Right: Point of Measurement: 10 cm From Medial Instep 18 cm Vascular Assessment Pulses: Dorsalis Pedis Palpable: [Right:Yes] Electronic Signature(s) Signed: 07/05/2020 4:49:43  PM By: Jeanine Luz Signed: 07/08/2020 7:55:11 AM By: Carlene Coria RN Entered By: Jeanine Luz on 07/05/2020 16:12:11 [name], [name] ([id number]) -------------------------------------------------------------------------------- Multi Wound Chart Details Patient Name: [name] Date of Service: 07/05/2020 3:30 PM Medical Record Number: [id number] Patient Account Number: [id number] Date of Birth/Sex: 11-27-1950 (70 y.o. F) Treating RN: Carlene Coria Primary Care Andreka Stucki: [name], Tamala [name] Clinician: Jeanine Luz Referring Laylonie Marzec: [name], Olivia Mackie Treating Cairo Agostinelli/Extender: Skipper Cliche in Treatment: 1 Vital Signs Height(in): 65.5 Pulse(bpm): 74 Weight(lbs): 240 Blood Pressure(mmHg): 130/76 Body Mass Index(BMI): 39 Temperature(F): 98.1 Respiratory Rate(breaths/min): 16 Photos: Wound Location: Left, Distal Toe Second Left, Posterior Calcaneus Right, Anterior Lower Leg Wounding Event: Pressure Injury Gradually Appeared Shear/Friction Primary Etiology: Diabetic Wound/Ulcer of the Lower Diabetic Wound/Ulcer of the Lower Trauma, Other Extremity Extremity Comorbid History: Asthma, Pneumothorax, Asthma, Pneumothorax, Asthma, Pneumothorax, Arrhythmia, Hypertension, Type II Arrhythmia, Hypertension, Type II Arrhythmia, Hypertension, Type II Diabetes, Gout, Osteoarthritis, Diabetes, Gout, Osteoarthritis, Diabetes, Gout, Osteoarthritis, Neuropathy Neuropathy Neuropathy Date Acquired: 01/05/2020 03/23/2020 06/03/2020 Weeks of Treatment: 1 1 1  Wound Status: Open Open Open Measurements L x W x D (cm) 0.4x0.4x0.1 0.3x0.3x0.1 0.7x0.3x0.1 Area (cm) : 0.126 0.071 0.165 Volume (cm) : 0.013 0.007 0.016 % Reduction in Area: 35.70% -129.00% 58.00% % Reduction in Volume: 35.00% -133.30% 59.00% Classification: Grade 1 Grade 1 Full Thickness Without Exposed Support Structures Exudate Amount: Small Small Small Exudate Type: Serous Serous Sanguinous Exudate  Color: amber amber red Wound Margin: N/A Flat and Intact N/A Granulation Amount: Small (1-33%) Small (1-33%) Medium (34-66%) Granulation Quality: Pink Pink, Pale Pink Necrotic Amount: Small (1-33%) Large (67-100%) None Present (0%) Necrotic Tissue: Eschar, Adherent [place] N/A Exposed Structures: Fat Layer (Subcutaneous Tissue): Fat Layer (Subcutaneous Tissue): Fat Layer (Subcutaneous Tissue): Yes Yes Yes Fascia: No Fascia: No Fascia: No Tendon: No Tendon: No Tendon: No Muscle: No Muscle: No Muscle: No Joint: No Joint: No Joint: No Bone: No Bone: No Bone: No Epithelialization: N/A None Small (1-33%) Treatment Notes Electronic Signature(s) Signed: 07/08/2020 7:55:11 AM By: Carlene Coria RN Previous Signature: 07/05/2020 3:37:53 PM Version By: Leotis Shames, [place] B. ([id number]) Entered By: Carlene Coria on 07/05/2020 16:35:57 [name], [name] ([id number]) -------------------------------------------------------------------------------- Multi-Disciplinary Care Plan Details Patient Name: [name] Date of Service: 07/05/2020 3:30 PM Medical Record Number: [id number]  Patient Account Number: [id number] Date of Birth/Sex: 02/24/1951 (70 y.o. F) Treating RN: Carlene Coria Primary Care Anica Alcaraz: [name], Tamala [name] Clinician: Jeanine Luz Referring Mattheu Brodersen: [name], Olivia Mackie Treating Piercen Covino/Extender: Skipper Cliche in Treatment: 1 Active Inactive Abuse / Safety / Falls / Self Care Management Nursing Diagnoses: Potential for injury related to falls Goals: Patient will remain injury free related to falls Date Initiated: 06/28/2020 Target Resolution Date: 07/28/2020 Goal Status: Active Interventions: Assess Activities of Daily Living upon admission and as needed Assess fall risk on admission and as needed Assess: immobility, friction, shearing, incontinence upon admission and as needed Assess impairment of mobility on  admission and as needed per policy Assess personal safety and home safety (as indicated) on admission and as needed Assess self care needs on admission and as needed Notes: Nutrition Nursing Diagnoses: Impaired glucose control: actual or potential Goals: Patient/caregiver verbalizes understanding of need to maintain therapeutic glucose control per primary care physician Date Initiated: 06/28/2020 Target Resolution Date: 07/28/2020 Goal Status: Active Interventions: Assess HgA1c results as ordered upon admission and as needed Assess patient nutrition upon admission and as needed per policy Notes: Wound/Skin Impairment Nursing Diagnoses: Knowledge deficit related to ulceration/compromised skin integrity Goals: Patient/caregiver will verbalize understanding of skin care regimen Date Initiated: 06/28/2020 Target Resolution Date: 07/28/2020 Goal Status: Active Ulcer/skin breakdown will have a volume reduction of 30% by week 4 Date Initiated: 06/28/2020 Target Resolution Date: 07/28/2020 Goal Status: Active Ulcer/skin breakdown will have a volume reduction of 50% by week 8 Date Initiated: 06/28/2020 Target Resolution Date: 08/28/2020 Goal Status: Active Ulcer/skin breakdown will have a volume reduction of 80% by week 12 Date Initiated: 06/28/2020 Target Resolution Date: 09/27/2020 NOREENE, BOREMAN ([id number]) Goal Status: Active Ulcer/skin breakdown will heal within 14 weeks Date Initiated: 06/28/2020 Target Resolution Date: 10/28/2020 Goal Status: Active Interventions: Assess patient/caregiver ability to obtain necessary supplies Assess patient/caregiver ability to perform ulcer/skin care regimen upon admission and as needed Assess ulceration(s) every visit Notes: Electronic Signature(s) Signed: 07/08/2020 7:55:11 AM By: Carlene Coria RN Entered By: Carlene Coria on 07/05/2020 16:35:42 Gilbo, [name]  ([id number]) -------------------------------------------------------------------------------- Pain Assessment Details Patient Name: [name] Date of Service: 07/05/2020 3:30 PM Medical Record Number: [id number] Patient Account Number: [id number] Date of Birth/Sex: [birth year] (70 y.o. F) Treating RN: Carlene Coria Primary Care Yzabelle Calles: [name], Tamala [name] Clinician: Jeanine Luz Referring Prosper Paff: [name], Olivia Mackie Treating Aneisha Skyles/Extender: Skipper Cliche in Treatment: 1 Active Problems Location of Pain Severity and Description of Pain Patient Has Paino Yes Site Locations Rate the pain. Current Pain Level: 6 Pain Management and Medication Current Pain Management: Electronic Signature(s) Signed: 07/05/2020 4:49:43 PM By: Jeanine Luz Signed: 07/08/2020 7:55:11 AM By: Carlene Coria RN Entered By: Jeanine Luz on 07/05/2020 15:54:03 [name], [name] ([id number]) -------------------------------------------------------------------------------- Patient/Caregiver Education Details Patient Name: [name] Date of Service: 07/05/2020 3:30 PM Medical Record Number: [id number] Patient Account Number: [id number] Date of Birth/Gender: 10/07/1950 (70 y.o. F) Treating RN: Carlene Coria Primary Care Physician: [name], Tamala [name] Clinician: Jeanine Luz Referring Physician: [name], Olivia Mackie Treating Physician/Extender: Skipper Cliche in Treatment: 1 Education Assessment Education Provided To: Patient Education Topics Provided Wound/Skin Impairment: Methods: Explain/Verbal Responses: State content correctly Electronic Signature(s) Signed: 07/08/2020 7:55:11 AM By: Carlene Coria RN Entered By: Carlene Coria on 07/05/2020 16:39:38 [name], [name] ([id number]) -------------------------------------------------------------------------------- Wound Assessment Details Patient Name: [name] Date of Service: 07/05/2020 3:30  PM Medical Record Number: [id number] Patient Account Number: [id number] Date of Birth/Sex: Sep 25, 1950 (70 y.o. F) Treating RN: Carlene Coria  Primary Care Imogine Carvell: [name], Olivia Mackie Other Clinician: Jeanine Luz Referring Dallan Schonberg: [name], Olivia Mackie Treating Cathy Ropp/Extender: Skipper Cliche in Treatment: 1 Wound Status Wound Number: 6 Primary Diabetic Wound/Ulcer of the Lower Extremity Etiology: Wound Location: Left, Distal Toe Second Wound Open Wounding Event: Pressure Injury Status: Date Acquired: 01/05/2020 Comorbid Asthma, Pneumothorax, Arrhythmia, Hypertension, Type Weeks Of Treatment: 1 History: II Diabetes, Gout, Osteoarthritis, Neuropathy Clustered Wound: No Photos Wound Measurements Length: (cm) 0.4 Width: (cm) 0.4 Depth: (cm) 0.1 Area: (cm) 0.126 Volume: (cm) 0.013 % Reduction in Area: 35.7% % Reduction in Volume: 35% Tunneling: No Undermining: No Wound Description Classification: Grade 1 Exudate Amount: Small Exudate Type: Serous Exudate Color: amber Foul Odor After Cleansing: No Slough/Fibrino Yes Wound Bed Granulation Amount: Small (1-33%) Exposed Structure Granulation Quality: Pink Fascia Exposed: No Necrotic Amount: Small (1-33%) Fat Layer (Subcutaneous Tissue) Exposed: Yes Necrotic Quality: Eschar, Adherent Slough Tendon Exposed: No Muscle Exposed: No Joint Exposed: No Bone Exposed: No Treatment Notes Wound #6 (Toe Second) Wound Laterality: Left, Distal Cleanser Normal Saline Discharge Instruction: Wash your hands with soap and water. Remove old dressing, discard into plastic bag and place into trash. Cleanse the wound with Normal Saline prior to applying a clean dressing using gauze sponges, not tissues or cotton balls. Do not scrub or use excessive force. Pat dry using gauze sponges, not tissue or cotton balls. [name], [name] ([id number]) Peri-Wound Care Topical Primary Dressing Prisma 4.34 (in) Discharge Instruction:  Moisten w/normal saline or sterile water; Cover wound as directed. Do not remove from wound bed. Secondary Dressing Coverlet Latex-Free Fabric Adhesive Dressings Discharge Instruction: 1.5 x 2 Secured With Compression Wrap Compression Stockings Add-Ons Electronic Signature(s) Signed: 07/05/2020 4:49:43 PM By: Jeanine Luz Signed: 07/08/2020 7:55:11 AM By: Carlene Coria RN Entered By: Jeanine Luz on 07/05/2020 16:09:06 [name] ([id number]) -------------------------------------------------------------------------------- Wound Assessment Details Patient Name: [name] Date of Service: 07/05/2020 3:30 PM Medical Record Number: [id number] Patient Account Number: [id number] Date of Birth/Sex: 1950/08/07 (70 y.o. F) Treating RN: Carlene Coria Primary Care Treylan Mcclintock: [name], Tamala [name] Clinician: Jeanine Luz Referring Wilian Kwong: [name], Olivia Mackie Treating Beula Joyner/Extender: Skipper Cliche in Treatment: 1 Wound Status Wound Number: 7 Primary Diabetic Wound/Ulcer of the Lower Extremity Etiology: Wound Location: Left, Posterior Calcaneus Wound Open Wounding Event: Gradually Appeared Status: Date Acquired: 03/23/2020 Comorbid Asthma, Pneumothorax, Arrhythmia, Hypertension, Type Weeks Of Treatment: 1 History: II Diabetes, Gout, Osteoarthritis, Neuropathy Clustered Wound: No Photos Wound Measurements Length: (cm) 0.3 Width: (cm) 0.3 Depth: (cm) 0.1 Area: (cm) 0.071 Volume: (cm) 0.007 % Reduction in Area: -129% % Reduction in Volume: -133.3% Epithelialization: None Tunneling: No Undermining: No Wound Description Classification: Grade 1 Wound Margin: Flat and Intact Exudate Amount: Small Exudate Type: Serous Exudate Color: amber Foul Odor After Cleansing: No Slough/Fibrino No Wound Bed Granulation Amount: Small (1-33%) Exposed Structure Granulation Quality: Pink, Pale Fascia Exposed: No Necrotic Amount: Large (67-100%) Fat Layer  (Subcutaneous Tissue) Exposed: Yes Necrotic Quality: Adherent Slough Tendon Exposed: No Muscle Exposed: No Joint Exposed: No Bone Exposed: No Treatment Notes Wound #7 (Calcaneus) Wound Laterality: Left, Posterior Cleanser Normal Saline Discharge Instruction: Wash your hands with soap and water. Remove old dressing, discard into plastic bag and place into trash. Cleanse the wound with Normal Saline prior to applying a clean dressing using gauze sponges, not tissues or cotton balls. Do not scrub or use excessive force. Pat dry using gauze sponges, not tissue or cotton balls. [name], [name] ([id number]) Peri-Wound Care Topical Primary Dressing Prisma 4.34 (in) Discharge Instruction: Moisten w/normal  saline or sterile water; Cover wound as directed. Do not remove from wound bed. Secondary Dressing Mepilex Border Flex, 4x4 (in/in) Discharge Instruction: Apply to wound as directed. Do not cut. Secured With Compression Wrap Compression Stockings Add-Ons Electronic Signature(s) Signed: 07/05/2020 4:49:43 PM By: Jeanine Luz Signed: 07/08/2020 7:55:11 AM By: Carlene Coria RN Entered By: Jeanine Luz on 07/05/2020 [name], [name] ([id number]) -------------------------------------------------------------------------------- Wound Assessment Details Patient Name: [name] Date of Service: 07/05/2020 3:30 PM Medical Record Number: [id number] Patient Account Number: [id number] Date of Birth/Sex: 02-May-1950 (70 y.o. F) Treating RN: Carlene Coria Primary Care Brittani Purdum: [name], Tamala [name] Clinician: Jeanine Luz Referring Jadamarie Butson: [name], Olivia Mackie Treating Jasmond River/Extender: Skipper Cliche in Treatment: 1 Wound Status Wound Number: 8 Primary Trauma, Other Etiology: Wound Location: Right, Anterior Lower Leg Wound Open Wounding Event: Shear/Friction Status: Date Acquired: 06/03/2020 Comorbid Asthma, Pneumothorax, Arrhythmia, Hypertension,  Type Weeks Of Treatment: 1 History: II Diabetes, Gout, Osteoarthritis, Neuropathy Clustered Wound: No Photos Wound Measurements Length: (cm) 0.7 Width: (cm) 0.3 Depth: (cm) 0.1 Area: (cm) 0.165 Volume: (cm) 0.016 % Reduction in Area: 58% % Reduction in Volume: 59% Epithelialization: Small (1-33%) Tunneling: No Undermining: No Wound Description Classification: Full Thickness Without Exposed Support Structures Exudate Amount: Small Exudate Type: Sanguinous Exudate Color: red Foul Odor After Cleansing: No Slough/Fibrino No Wound Bed Granulation Amount: Medium (34-66%) Exposed Structure Granulation Quality: Pink Fascia Exposed: No Necrotic Amount: None Present (0%) Fat Layer (Subcutaneous Tissue) Exposed: Yes Tendon Exposed: No Muscle Exposed: No Joint Exposed: No Bone Exposed: No Treatment Notes Wound #8 (Lower Leg) Wound Laterality: Right, Anterior Cleanser Normal Saline Discharge Instruction: Wash your hands with soap and water. Remove old dressing, discard into plastic bag and place into trash. Cleanse the wound with Normal Saline prior to applying a clean dressing using gauze sponges, not tissues or cotton balls. Do not scrub or use excessive force. Pat dry using gauze sponges, not tissue or cotton balls. HANIAH, PENNY ([id number]) Peri-Wound Care Topical Primary Dressing Prisma 4.34 (in) Discharge Instruction: Moisten w/normal saline or sterile water; Cover wound as directed. Do not remove from wound bed. Secondary Dressing Mepilex Border Flex, 4x4 (in/in) Discharge Instruction: Apply to wound as directed. Do not cut. Secured With Compression Wrap Compression Stockings Add-Ons Electronic Signature(s) Signed: 07/05/2020 4:49:43 PM By: Jeanine Luz Signed: 07/08/2020 7:55:11 AM By: Carlene Coria RN Entered By: Jeanine Luz on 07/05/2020 16:10:29 [name]  ([id number]) -------------------------------------------------------------------------------- Vitals Details Patient Name: [name] Date of Service: 07/05/2020 3:30 PM Medical Record Number: [id number] Patient Account Number: [id number] Date of Birth/Sex: Jun 03, 1950 (70 y.o. F) Treating RN: Carlene Coria Primary Care Odella Appelhans: [name], Tamala [name] Clinician: Jeanine Luz Referring Chantea Surace: [name], Olivia Mackie Treating Misha Antonini/Extender: Skipper Cliche in Treatment: 1 Vital Signs Time Taken: 15:53 Temperature (F): 98.1 Height (in): 65.5 Pulse (bpm): 66 Weight (lbs): 240 Respiratory Rate (breaths/min): 16 Body Mass Index (BMI): 39.3 Blood Pressure (mmHg): 130/76 Reference Range: 80 - 120 mg / dl Electronic Signature(s) Signed: 07/05/2020 4:49:43 PM By: Jeanine Luz Entered By: Jeanine Luz on 07/05/2020 15:53:43

## 2020-07-05 NOTE — Progress Notes (Addendum)
PROVIDENCIA, HOTTENSTEIN ([id number]) Visit Report for 07/05/2020 Chief Complaint Document Details Patient Name: [name], [name] Date of Service: 07/05/2020 3:30 PM Medical Record Number: [id number] Patient Account Number: [id number] Date of Birth/Sex: [birth year] (70 y.o. F) Treating RN: Carlene Coria Primary Care Provider: [name], Tamala [name] Clinician: Jeanine Luz Referring Provider: [name], Olivia Mackie Treating Provider/Extender: Skipper Cliche in Treatment: 1 Information Obtained from: Patient Chief Complaint B/L lower extremity ulcers Electronic Signature(s) Signed: 07/05/2020 3:37:58 PM By: Worthy Keeler PA-C Entered By: Worthy Keeler on 07/05/2020 15:37:58 Sidman, [name] ([id number]) -------------------------------------------------------------------------------- Debridement Details Patient Name: [name] Date of Service: 07/05/2020 3:30 PM Medical Record Number: [id number] Patient Account Number: [id number] Date of Birth/Sex: Nov 07, 1950 (69 y.o. F) Treating RN: Carlene Coria Primary Care Provider: [name], Tamala [name] Clinician: Jeanine Luz Referring Provider: [name], Olivia Mackie Treating Provider/Extender: Skipper Cliche in Treatment: 1 Debridement Performed for Wound #7 Left,Posterior Calcaneus Assessment: Performed By: Physician Tommie Sams., PA-C Debridement Type: Debridement Severity of Tissue Pre Debridement: Fat layer exposed Level of Consciousness (Pre- Awake and Alert procedure): Pre-procedure Verification/Time Out Yes - 16:37 Taken: Start Time: 16:37 Pain Control: Lidocaine 4% Topical Solution Total Area Debrided (L x W): 0.3 (cm) x 0.3 (cm) = 0.09 (cm) Tissue and other material Viable, Non-Viable, Slough, Subcutaneous, Skin: Dermis , Skin: Epidermis, Slough debrided: Level: Skin/Subcutaneous Tissue Debridement Description: Excisional Instrument: Curette Bleeding: Minimum Hemostasis Achieved: Pressure End  Time: 16:38 Procedural Pain: 0 Post Procedural Pain: 0 Response to Treatment: Procedure was tolerated well Level of Consciousness (Post- Awake and Alert procedure): Post Debridement Measurements of Total Wound Length: (cm) 0.3 Width: (cm) 0.3 Depth: (cm) 0.3 Volume: (cm) 0.021 Character of Wound/Ulcer Post Debridement: Improved Severity of Tissue Post Debridement: Fat layer exposed Post Procedure Diagnosis Same as Pre-procedure Electronic Signature(s) Signed: 07/05/2020 5:20:58 PM By: Worthy Keeler PA-C Signed: 07/08/2020 7:55:11 AM By: Carlene Coria RN Entered By: Carlene Coria on 07/05/2020 16:38:46 Brannock, [name] ([id number]) -------------------------------------------------------------------------------- HPI Details Patient Name: [name] Date of Service: 07/05/2020 3:30 PM Medical Record Number: [id number] Patient Account Number: [id number] Date of Birth/Sex: [birth year] (69 y.o. F) Treating RN: Carlene Coria Primary Care Provider: [name], Tamala [name] Clinician: Jeanine Luz Referring Provider: [name], Olivia Mackie Treating Provider/Extender: Skipper Cliche in Treatment: 1 History of Present Illness HPI Description: 06/28/20 upon evaluation today patient appears to be doing unfortunately somewhat poorly in regard to wounds that she has over her lower extremities. She has a wound on the left distal second toe, left posterior heel, and right anterior lower leg. With that being said all in all none of the wounds appear to be too bad the one that hurts her the most is actually the wound on the posterior heel which actually is also one of the smallest. Nonetheless I think there is some callus hiding what is really going on in this area. Fortunately there does not appear to be any obvious evidence of infection which is great news. Patient does have a history of diabetes mellitus type 2, chronic venous insufficiency, hypertension, history of DVTs, and is on  long-term anticoagulant therapy, Eliquis. 07/05/20 upon evaluation today patient appears to be doing well currently with regard to her wounds. I feel like she is making progress which is great news and overall there is no signs of active infection at this time. No fevers, chills, nausea, vomiting, or diarrhea. Electronic Signature(s) Signed: 07/05/2020 5:13:17 PM By: Worthy Keeler PA-C Entered By: Worthy Keeler on 07/05/2020 17:13:17 Rabadan, Izora Gala  Jacinto Reap ([id number]) -------------------------------------------------------------------------------- Physical Exam Details Patient Name: [name] Date of Service: 07/05/2020 3:30 PM Medical Record Number: [id number] Patient Account Number: [id number] Date of Birth/Sex: 14-Oct-1950 (70 y.o. F) Treating RN: Carlene Coria Primary Care Provider: [name], Tamala [name] Clinician: Jeanine Luz Referring Provider: [name], Olivia Mackie Treating Provider/Extender: Skipper Cliche in Treatment: 1 Constitutional Obese and well-hydrated in no acute distress. Respiratory normal breathing without difficulty. Psychiatric this patient is able to make decisions and demonstrates good insight into disease process. Alert and Oriented x 3. pleasant and cooperative. Notes Upon inspection patient's wound bed showed signs of good granulation epithelization at this point. There does not appear to be any signs of significant infection she did have some necrotic tissue noted on the heel this did require sharp debridement today she tolerated the debridement without complication and post debridement the wound bed appears to be doing much better. Electronic Signature(s) Signed: 07/05/2020 5:13:34 PM By: Worthy Keeler PA-C Entered By: Worthy Keeler on 07/05/2020 17:13:34 Hassing, [name] ([id number]) -------------------------------------------------------------------------------- Physician Orders Details Patient Name: [name] Date of  Service: 07/05/2020 3:30 PM Medical Record Number: [id number] Patient Account Number: [id number] Date of Birth/Sex: March 29, 1950 (69 y.o. 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 [place] for wound care. May utilize formulary equivalent dressing for wound treatment orders unless otherwise specified. Home Health Nurse may visit PRN to address patientos wound care needs. [facility] Bathing/ Shower/ Hygiene o May shower; gently cleanse wound with antibacterial soap, rinse and pat dry prior to dressing wounds Edema Control - Lymphedema / Segmental Compressive Device / Other o Elevate, Exercise Daily and Avoid Standing for Long Periods of Time. o Elevate legs to the level of the heart and pump ankles as often as possible o Elevate leg(s) parallel to the floor when sitting. Wound Treatment Wound #6 - Toe Second Wound Laterality: Left, Distal Cleanser: Normal Saline (Generic) 3 x Per Week/30 Days Discharge Instructions: Wash your hands with soap and water. Remove old dressing, discard into plastic bag and place into trash. Cleanse the wound with Normal Saline prior to  applying a clean dressing using gauze sponges, not tissues or cotton balls. Do not scrub or use excessive force. Pat dry using gauze sponges, not tissue or cotton balls. Primary Dressing: Prisma 4.34 (in) (Generic) 3 x Per Week/30 Days Discharge Instructions: Moisten w/normal saline or sterile water; Cover wound as directed. Do not remove from wound bed. Secondary Dressing: Coverlet Latex-Free Fabric Adhesive Dressings (Generic) 3 x Per Week/30 Days Discharge Instructions: 1.5 x 2 Wound #7 - Calcaneus Wound Laterality: Left, Posterior Cleanser: Normal Saline (Generic) 3 x Per Week/30 Days Discharge Instructions: Wash your hands with soap and water. Remove old dressing, discard into plastic bag and place into trash. Cleanse the wound with Normal Saline prior to applying a clean dressing  using gauze sponges, not tissues or cotton balls. Do not scrub or use excessive force. Pat dry using gauze sponges, not tissue or cotton balls. Primary Dressing: Prisma 4.34 (in) (Generic) 3 x Per Week/30 Days Discharge Instructions: Moisten w/normal saline or sterile water; Cover wound as directed. Do not remove from wound bed. Secondary Dressing: Mepilex Border Flex, 4x4 (in/in) (Generic) 3 x Per Week/30 Days Discharge Instructions: Apply to wound as directed. Do not cut. Wound #8 - Lower Leg Wound Laterality: Right, Anterior Hearns, Mckenlee B. ([id number]) Cleanser: Normal Saline (Generic) 3 x Per Week/30 Days Discharge Instructions: Wash your hands with soap and water. Remove old dressing, discard into plastic bag and place into trash. Cleanse the wound with Normal Saline prior to applying a clean dressing using gauze sponges, not tissues or cotton balls. Do not scrub or use excessive force. Pat dry using gauze sponges, not tissue or cotton balls. Primary Dressing: Prisma 4.34 (in) (Generic) 3 x Per Week/30 Days Discharge Instructions: Moisten w/normal saline or sterile water; Cover wound as directed. Do not  remove from wound bed. Secondary Dressing: Mepilex Border Flex, 4x4 (in/in) (Generic) 3 x Per Week/30 Days Discharge Instructions: Apply to wound as directed. Do not cut. Electronic Signature(s) Signed: 07/05/2020 5:20:58 PM By: Worthy Keeler PA-C Signed: 07/08/2020 7:55:11 AM By: Carlene Coria RN Entered By: Carlene Coria on 07/05/2020 16:39:13 JOYDAN, GRETZINGER ([id number]) -------------------------------------------------------------------------------- Problem List Details Patient Name: [name] Date of Service: 07/05/2020 3:30 PM Medical Record Number: [id number] Patient Account Number: [id number] Date of Birth/Sex: 1950-12-16 (70 y.o. F) Treating RN: Carlene Coria Primary Care Provider: [name], Tamala [name] Clinician: Jeanine Luz Referring Provider: [name], Olivia Mackie Treating Provider/Extender: Skipper Cliche in Treatment: 1 Active Problems ICD-10 Encounter Code Description Active Date MDM Diagnosis E11.621 Type 2 diabetes mellitus with foot ulcer 06/28/2020 No Yes I87.2 Venous insufficiency (chronic) (peripheral) 06/28/2020 No Yes L97.522 Non-pressure chronic ulcer of other part of left foot with fat layer 06/28/2020 No Yes exposed L97.812 Non-pressure chronic ulcer of other part of right lower leg with fat layer 06/28/2020 No Yes exposed L97.322 Non-pressure chronic ulcer of left ankle with fat layer exposed 06/28/2020 No Yes I10 Essential (primary) hypertension 06/28/2020 No Yes Z86.718 Personal history of other venous thrombosis and embolism 06/28/2020 No Yes Z79.01 Long term (current) use of anticoagulants 06/28/2020 No Yes Inactive Problems Resolved Problems Electronic Signature(s) Signed: 07/05/2020 3:37:50 PM By: Worthy Keeler PA-C Entered By: Worthy Keeler on 07/05/2020 15:37:49 Vallance, [name] ([id number]) -------------------------------------------------------------------------------- Progress Note Details Patient Name: [name] Date of  Service: 07/05/2020 3:30 PM Medical Record Number: [id number] Patient Account Number: [id number] Date of Birth/Sex: [birth year] (69 y.o. F) Treating RN: Carlene Coria Primary Care Provider: [name], Tamala [name] Clinician: Jeanine Luz Referring Provider: [name], Olivia Mackie Treating Provider/Extender: Skipper Cliche in Treatment: 1 Subjective Chief Complaint Information obtained from Patient B/L lower extremity ulcers History of Present Illness (HPI) 06/28/20 upon evaluation today patient appears to be doing unfortunately somewhat poorly in regard to wounds that she has over her lower extremities. She has a wound on the left distal second toe, left posterior heel, and right anterior lower leg. With that being said all in all none of the wounds appear to be too bad the one that hurts her the most is actually the wound on the posterior heel which actually is also one of the smallest. Nonetheless I think there is some callus hiding what is really going on in this area. Fortunately there does not  appear to be any obvious evidence of infection which is great news. Patient does have a history of diabetes mellitus type 2, chronic venous insufficiency, hypertension, history of DVTs, and is on long-term anticoagulant therapy, Eliquis. 07/05/20 upon evaluation today patient appears to be doing well currently with regard to her wounds. I feel like she is making progress which is great news and overall there is no signs of active infection at this time. No fevers, chills, nausea, vomiting, or diarrhea. Objective Constitutional Obese and well-hydrated in no acute distress. Vitals Time Taken: 3:53 PM, Height: 65.5 in, Weight: 240 lbs, BMI: 39.3, Temperature: 98.1 F, Pulse: 66 bpm, Respiratory Rate: 16 breaths/min, Blood Pressure: 130/76 mmHg. Respiratory normal breathing without difficulty. Psychiatric this patient is able to make decisions and demonstrates good insight into disease  process. Alert and Oriented x 3. pleasant and cooperative. General Notes: Upon inspection patient's wound bed showed signs of good granulation epithelization at this point. There does not appear to be any signs of significant infection she did have some necrotic tissue noted on the heel this did require sharp debridement today she tolerated the debridement without complication and post debridement the wound bed appears to be doing much better. Integumentary (Hair, Skin) Wound #6 status is Open. Original cause of wound was Pressure Injury. The date acquired was: 01/05/2020. The wound has been in treatment 1 weeks. The wound is located on the Left,Distal Toe Second. The wound measures 0.4cm length x 0.4cm width x 0.1cm depth; 0.126cm^2 area and 0.013cm^3 volume. There is Fat Layer (Subcutaneous Tissue) exposed. There is no tunneling or undermining noted. There is a small amount of serous drainage noted. There is small (1-33%) pink granulation within the wound bed. There is a small (1-33%) amount of necrotic tissue within the wound bed including Eschar and Adherent Slough. Wound #7 status is Open. Original cause of wound was Gradually Appeared. The date acquired was: 03/23/2020. The wound has been in treatment 1 weeks. The wound is located on the Left,Posterior Calcaneus. The wound measures 0.3cm length x 0.3cm width x 0.1cm depth; 0.071cm^2 area and 0.007cm^3 volume. There is Fat Layer (Subcutaneous Tissue) exposed. There is no tunneling or undermining noted. There is a small amount of serous drainage noted. The wound margin is flat and intact. There is small (1-33%) pink, pale granulation within the wound bed. There is a large (67-100%) amount of necrotic tissue within the wound bed including Adherent Slough. Wound #8 status is Open. Original cause of wound was Shear/Friction. The date acquired was: 06/03/2020. The wound has been in treatment 1 weeks. The wound is located on the Right,Anterior Lower Leg.  The wound measures 0.7cm length x 0.3cm width x 0.1cm depth; 0.165cm^2 area and 0.016cm^3 volume. There is Fat Layer (Subcutaneous Tissue) exposed. There is no tunneling or undermining noted. There is a small amount of sanguinous drainage noted. There is medium (34-66%) pink granulation within the wound bed. There is no necrotic tissue within the wound bed. 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 There was a Excisional Skin/Subcutaneous Tissue Debridement with a total area of 0.09 sq cm performed by Tommie Sams., PA-C. With the following instrument(s): Curette to remove Viable and Non-Viable tissue/material. Material removed includes Subcutaneous Tissue, Slough, Skin: Dermis, and Skin: Epidermis after achieving pain control using Lidocaine 4% Topical Solution. No specimens were taken. A time out was conducted at 16:37, prior to the start of the procedure. A Minimum amount of bleeding was controlled with Pressure. The procedure was tolerated well with a pain level of 0 throughout and a pain level of 0 following the procedure. Post Debridement Measurements: 0.3cm length x 0.3cm width x 0.3cm depth; 0.021cm^3 volume. Character of Wound/Ulcer Post Debridement is improved. Severity of Tissue Post Debridement is: Fat layer exposed. Post  procedure Diagnosis Wound #7: Same as Pre-Procedure Plan Follow-up Appointments: Return Appointment in 1 week. Home Health: [facility] for wound care. May utilize formulary equivalent dressing for wound treatment orders unless otherwise specified. Home Health Nurse may visit PRN to address patient s wound care needs. [facility] Bathing/ Shower/ Hygiene: May shower; gently cleanse wound with antibacterial soap, rinse and pat dry prior to dressing wounds Edema Control - Lymphedema / Segmental Compressive Device / Other: Elevate, Exercise Daily and Avoid Standing for Long Periods of Time. Elevate legs to the level of the heart and pump ankles as often as possible Elevate leg(s) parallel to the floor when sitting. WOUND #6: - Toe Second Wound Laterality: Left, Distal Cleanser: Normal Saline (Generic) 3 x Per Week/30 Days Discharge Instructions: Wash your hands with soap and water. Remove old dressing, discard into plastic bag and place into trash. Cleanse the wound with Normal Saline prior to applying a clean dressing using gauze sponges, not tissues or cotton balls. Do not scrub or use excessive force. Pat dry using gauze sponges, not tissue or cotton balls. Primary Dressing: Prisma 4.34 (in) (Generic) 3 x Per Week/30 Days Discharge Instructions: Moisten w/normal saline or sterile water; Cover wound as directed. Do not remove from wound bed. Secondary Dressing: Coverlet Latex-Free Fabric Adhesive Dressings (Generic) 3 x Per Week/30 Days Discharge Instructions: 1.5 x 2 WOUND #7: - Calcaneus Wound Laterality: Left, Posterior Cleanser: Normal Saline (Generic) 3 x Per Week/30 Days Discharge Instructions: Wash your hands with soap and water. Remove old dressing, discard into plastic bag and place into trash. Cleanse the wound with Normal Saline prior to applying a clean dressing using gauze sponges, not tissues or cotton balls. Do not scrub or use excessive force. Pat dry using gauze  sponges, not tissue or cotton balls. Primary Dressing: Prisma 4.34 (in) (Generic) 3 x Per Week/30 Days Discharge Instructions: Moisten w/normal saline or sterile water; Cover wound as directed. Do not remove from wound bed. Secondary Dressing: Mepilex Border Flex, 4x4 (in/in) (Generic) 3 x Per Week/30 Days Discharge Instructions: Apply to wound as directed. Do not cut. WOUND #8: - Lower Leg Wound Laterality: Right, Anterior Cleanser: Normal Saline (Generic) 3 x Per Week/30 Days Discharge Instructions: Wash your hands with soap and water. Remove old dressing, discard into plastic bag and place into trash. Cleanse the wound with Normal Saline prior to applying a clean dressing using gauze sponges, not tissues or cotton balls. Do not scrub or use excessive force. Pat dry using gauze sponges, not tissue or cotton balls. KASONDRA, JUNOD ([id number]) Primary Dressing: Prisma 4.34 (in) (Generic) 3 x Per Week/30 Days Discharge Instructions: Moisten w/normal  saline or sterile water; Cover wound as directed. Do not remove from wound bed. Secondary Dressing: Mepilex Border Flex, 4x4 (in/in) (Generic) 3 x Per Week/30 Days Discharge Instructions: Apply to wound as directed. Do not cut. 1. Would recommend currently that we going continue with the wound care measures as before and the patient is in agreement with that plan this includes the use of the silver collagen dressing to all locations I feel like this is doing a great job. 2. We will cover with a border foam dressing on the heel as well as on the leg. 3. I am also can recommend a coverlet/Band-Aid over the toe. We will see patient back for reevaluation in 1 week here in the clinic. If anything worsens or changes patient will contact our office for additional recommendations. Electronic Signature(s) Signed: 07/05/2020 5:14:12 PM By: Worthy Keeler PA-C Entered By: Worthy Keeler on 07/05/2020 17:14:11 Widrig, [name]  ([id number]) -------------------------------------------------------------------------------- SuperBill Details Patient Name: [name] Date of Service: 07/05/2020 Medical Record Number: [id number] Patient Account Number: [id number] Date of Birth/Sex: 09-13-1950 (70 y.o. F) Treating RN: Carlene Coria Primary Care Provider: [name], Tamala [name] Clinician: Jeanine Luz Referring Provider: [name], Olivia Mackie Treating Provider/Extender: Skipper Cliche in Treatment: 1 Diagnosis Coding ICD-10 Codes Code Description [phone] Type 2 diabetes mellitus with foot ulcer I87.2 Venous insufficiency (chronic) (peripheral) L97.522 Non-pressure chronic ulcer of other part of left foot with fat layer exposed L97.812 Non-pressure chronic ulcer of other part of right lower leg with fat layer exposed L97.322 Non-pressure chronic ulcer of left ankle with fat layer exposed I10 Essential (primary) hypertension Z86.718 Personal history of other venous thrombosis and embolism Z79.01 Long term (current) use of anticoagulants Facility Procedures CPT4 Code: [id number] Description: 62229 - DEB SUBQ TISSUE 20 SQ CM/< Modifier: Quantity: 1 CPT4 Code: Description: ICD-10 Diagnosis Description L97.522 Non-pressure chronic ulcer of other part of left foot with fat layer exp Modifier: osed Quantity: Physician Procedures CPT4 Code: [id number] Description: 11042 - WC PHYS SUBQ TISS 20 SQ CM Modifier: Quantity: 1 CPT4 Code: Description: ICD-10 Diagnosis Description L97.522 Non-pressure chronic ulcer of other part of left foot with fat layer exp Modifier: osed Quantity: Electronic Signature(s) Signed: 07/05/2020 5:15:15 PM By: Worthy Keeler PA-C Entered By: Worthy Keeler on 07/05/2020 17:15:14

## 2020-07-06 IMAGING — DX DG SACRUM/COCCYX 2+V
3 series · 3 of 3 positions shown · non-contrast
Comparison: 05/24/2017

CLINICAL DATA: Fall, landed on tailbone

EXAM:
SACRUM AND COCCYX - 2+ VIEW

[sacrum 20° caudo-cranial ap]
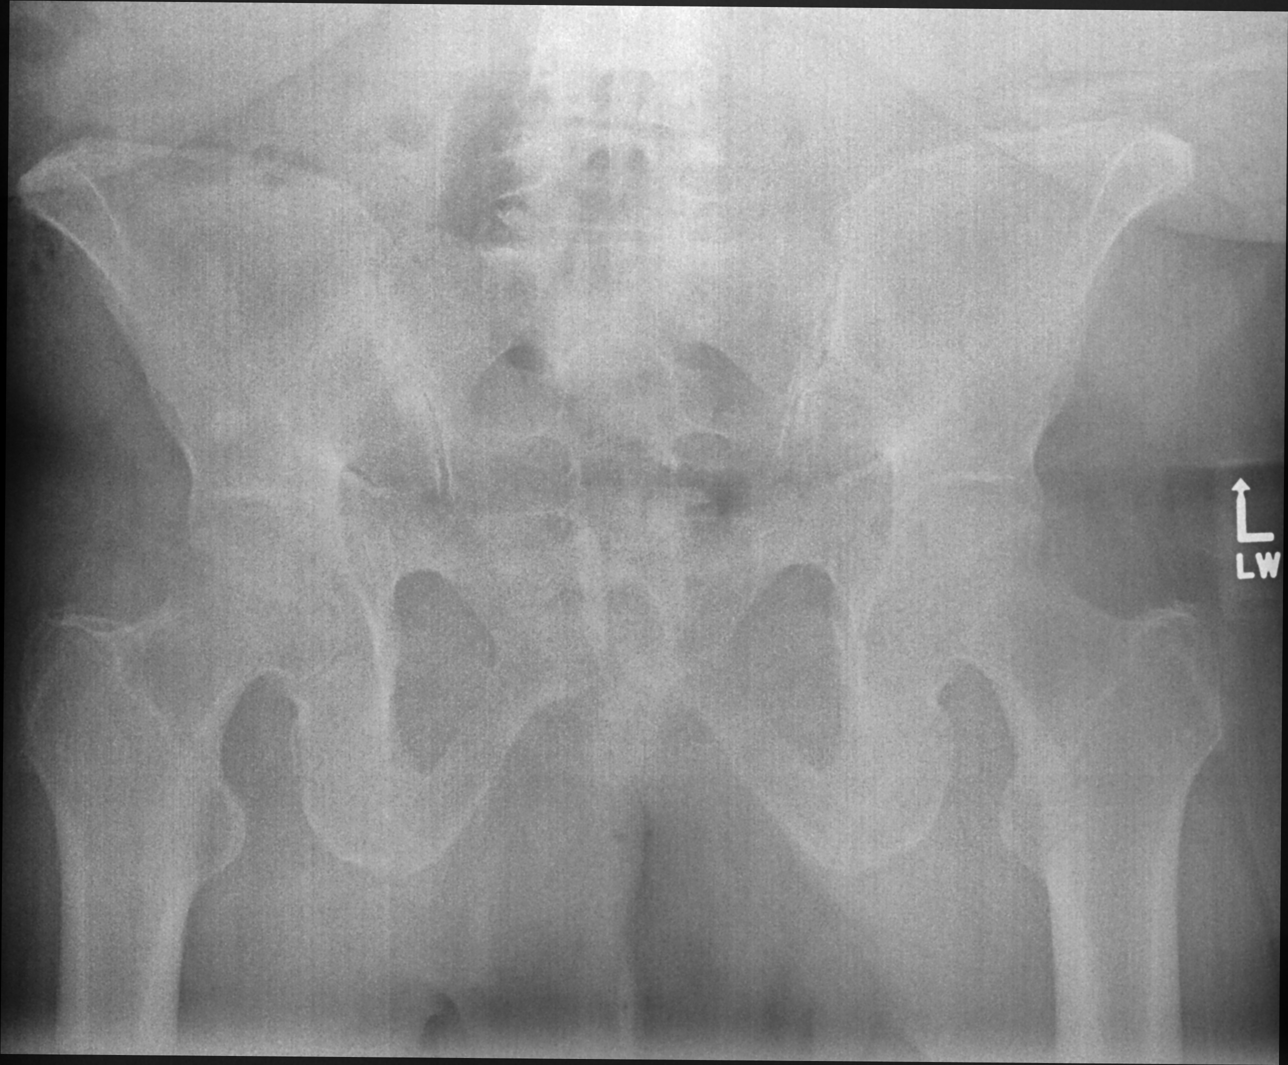

[coccyx 20° cranio-caudal ap]
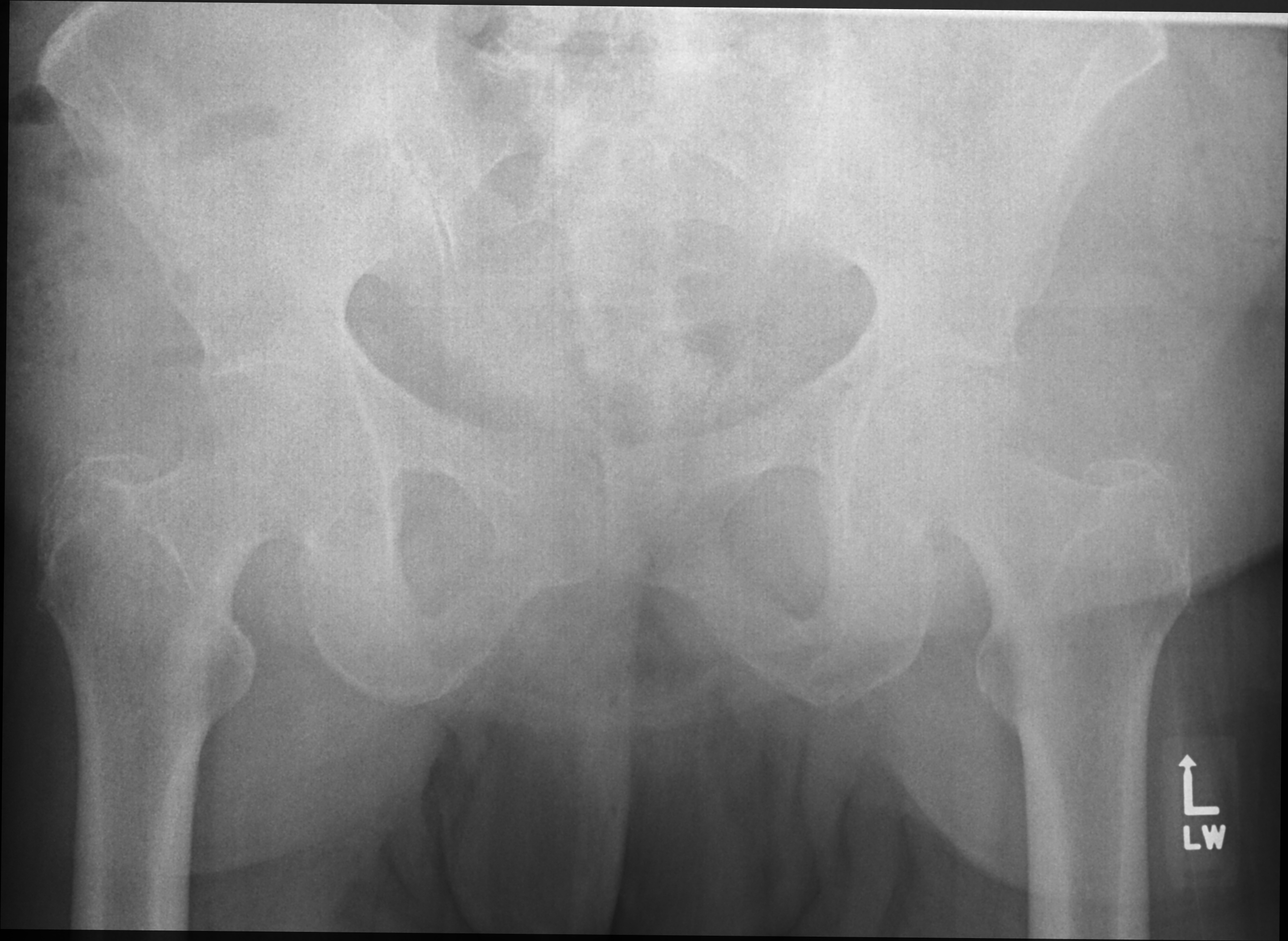

[sacrum lat]
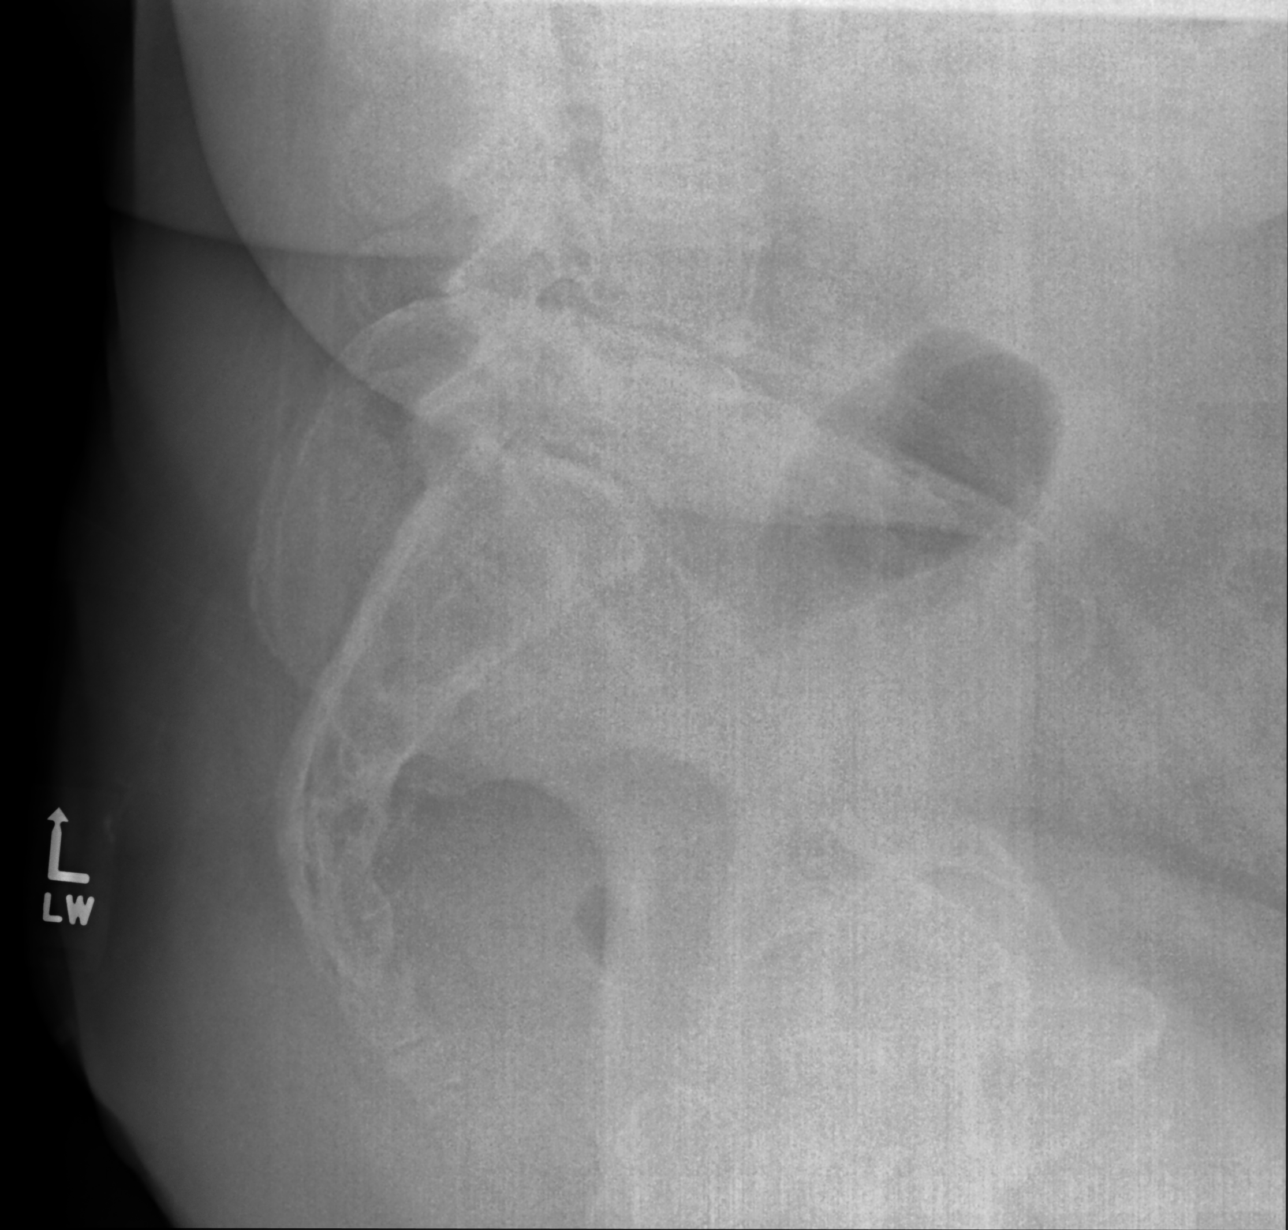

[3 of 3 positions shown; findings below may reference images not displayed]

FINDINGS: Technically suboptimal study. I see no definite sacral or coccygeal
fracture. No visible focal bone lesion..
IMPRESSION: No visible acute bony abnormality.  Limited study by technique.

## 2020-07-09 ENCOUNTER — Telehealth: Payer: Self-pay | Admitting: Licensed Clinical Social Worker

## 2020-07-09 DIAGNOSIS — G4733 Obstructive sleep apnea (adult) (pediatric): Secondary | ICD-10-CM | POA: Diagnosis not present

## 2020-07-09 NOTE — Telephone Encounter (Signed)
LCSW received a call from [employer], they are inquiring if we are willing to have them assist pt with transportation to [facility] appointment on 4/22. LCSW shared that we could assist as pt has an appointment right before at a clinic next door. I then called pt spouse Dellis Filbert and shared with him that we could assist this time but that if there were any ongoing f/u appointments at [facility] that it was unfortunately not in network with our [employer] and that they may need to utilize alternate transportation to get to those appointments. I then reviewed additional upcoming appointments and confirmed that wound care, pain clinic, pcp and [facility] are all in the system and they can utilize Transportation Services to get to those appointments. Pt husband states understanding, will call me if they run into any other obstacles.   Westley Hummer, MSW, [place]  [phone]

## 2020-07-10 ENCOUNTER — Telehealth: Payer: Self-pay

## 2020-07-10 DIAGNOSIS — G4733 Obstructive sleep apnea (adult) (pediatric): Secondary | ICD-10-CM | POA: Diagnosis not present

## 2020-07-10 NOTE — Telephone Encounter (Signed)
Faxed order number [id number] back to [facility] to [phone] on 07/09/20 SN effective 07/07/20 1WK1

## 2020-07-12 ENCOUNTER — Ambulatory Visit: Payer: Medicare HMO | Admitting: Physician Assistant

## 2020-07-14 NOTE — Progress Notes (Signed)
PROVIDER NOTE: Information contained herein reflects review and annotations entered in association with encounter. Interpretation of such information and data should be left to medically-trained personnel. Information provided to patient can be located elsewhere in the medical record under "Patient Instructions". Document created using STT-dictation technology, any transcriptional errors that may result from process are unintentional.    Patient: [name]  Service Category: E/M  Provider: Gaspar Cola, MD  DOB: 01/03/1951  DOS: 07/15/2020  Specialty: Interventional Pain Management  MRN: [id number]  Setting: Ambulatory outpatient  PCP: [name], [name], MD  Type: Established Patient    Referring Provider: Orland Mustard *  Location: Office  Delivery: Face-to-face     HPI  [name], a 70 y.o. year old adult, is here today because of her Chronic pain syndrome [G89.4]. [name] primary complain today is Leg Pain (bilateral) and Shoulder Pain (bilateral) Last encounter: My last encounter with her was on 06/20/2020. Pertinent problems: [name] has Diabetic peripheral neuropathy associated with type 2 diabetes mellitus ([place]); Other shoulder lesions (Right); Neuropathy of right lateral femoral cutaneous nerve; Arthralgia; Osteoarthritis involving multiple joints; Status post reverse total shoulder replacement; Chronic pain syndrome; Chronic right shoulder pain; Carpal tunnel syndrome; Cervical radiculitis; Cubital tunnel syndrome on right; Impingement syndrome of shoulder region; Pain in joint involving ankle and foot; Spinal stenosis of thoracic region; Pain of left hip joint; Abnormality of gait and mobility; Diabetic ulcer of left foot ([place]); Muscle strain of left hip; Strain of right hip; Ischial pain, right; Fibromyalgia syndrome; Chronic musculoskeletal pain; Peripheral vascular disease ([place]); Chronic ulcer of right leg ([place]); Meralgia paresthetica of right  side; Chronic pain of right ankle; Arthritis; Left ankle pain; Left arm pain; Left hemiparesis ([place]); Primary osteoarthritis of left shoulder; Rotator cuff disorder, left; Ulcer of left heel and midfoot (HCC); PAD (peripheral artery disease) ([place]); Peripheral neurogenic pain; and Chronic peripheral neuropathic pain on their pertinent problem list. Pain Assessment: Severity of Chronic pain is reported as a 8 /10. Location: Shoulder Left/left arm. Onset: More than a month ago. Quality: Pressure. Timing: Constant. Modifying factor(s): medications. Vitals:  height is '5\' 5"'  (1.651 m) and weight is 215 lb (97.5 kg). Her temporal temperature is 97.2 F (36.2 C) (abnormal). Her blood pressure is 108/84 and her pulse is 60. Her respiration is 16 and oxygen saturation is 92%.   Reason for encounter: medication management.   The patient indicates doing well with the current medication regimen. No adverse reactions or side effects reported to the medications.   Since our last face-to-face visit, the patient has had a stroke with neurological sequela, despite the fact that she was on Eliquis.  She comes into the clinics today on a wheelchair having some difficulty with hoarseness, but she seems to be articulating her speech well.  She comes in today with her husband who has been very helpful in providing [place] with information and details as to what has transpired.  Today she requested that we refill her Lyrica and I reminded her that we had a ready transfer the Lyrica and the Cymbalta to her primary care physician.  At the beginning she became somewhat upset saying that she had not been notified, but when I showed her the 02/19/2020 note where I had documented that I have informed her of this change and I also showed her that letter that I had sent her as well as that which I sent to her PCP, she realized that she had been notified.  In any case, since she has run out of medication I have given her a 30-day refill on her  Lyrica and Cymbalta and I have stressed the fact that we will not be doing any more refills on those.  She indicates that she could tell the difference between taking the medication and not.  Today the patient also complained of some pain in the left shoulder and indicated that she had been told that perhaps it had something to do with the stroke.  She also commented that hydrocodone does not seem to be helping with some of her pain.  At this point, I explained to her that the hydrocodone would not be helping with any of the neuropathic or neurogenic pain.  I described to her that the pain that it would not help is at associated with burning sensations, and electrical-like and stabbing shooting like sensations.  This is because they are all neurogenic and opioids are known not to help with that kind of pain.  In fact, she was concerned as to whether this left shoulder pain had anything to do with the stroke versus her shoulder problems and I asked her to tell me what type of medication helped when she was getting the shoulder pain.  She indicated that the Voltaren seem to help the most.  Again, since Voltaren is not necessarily a membrane stabilizer and its not likely to help with the neuropathic component of the pain, it is very likely that this shoulder pain is arthropathic.  In any case today we also talked about the issue of tolerance with the opioids and the fact that she needs to continue taking the Lyrica and Cymbalta for the neuropathic component of her pain.  She indicated that she will be contacting her primary care physician to make arrangements for that transfer.  Again we did send a communication to her PCP on 02/19/2020 indicating the 2 medications that we were transferring.  We will continue to manage her hydrocodone.  RTCB: 10/29/2020 Nonopioids transferred 02/19/2020: Lyrica and Cymbalta  Pharmacotherapy Assessment   Analgesic: Hydrocodone/APAP 5/325 mg, 1 tab PO q 8 hours (15 mg/day of  hydrocodone) MME/day:15 mg/day.   Monitoring: [place] PMP: PDMP reviewed during this encounter.       Pharmacotherapy: No side-effects or adverse reactions reported. Compliance: No problems identified. Effectiveness: Clinically acceptable.  Landis Martins, RN  07/15/2020  3:31 PM  Sign when Signing Visit Nursing Pain Medication Assessment:  Safety precautions to be maintained throughout the outpatient stay will include: orient to surroundings, keep bed in low position, maintain call bell within reach at all times, provide assistance with transfer out of bed and ambulation.  Medication Inspection Compliance: Pill count conducted under aseptic conditions, in front of the patient. Neither the pills nor the bottle was removed from the patient's sight at any time. Once count was completed pills were immediately returned to the patient in their original bottle.  Medication: Hydrocodone/APAP Pill/Patch Count: 48 of 90 pills remain Pill/Patch Appearance: Markings consistent with prescribed medication Bottle Appearance: Standard pharmacy container. Clearly labeled. Filled Date: 07/01/2020 Last Medication intake:  Today    UDS:  Summary  Date Value Ref Range Status  11/28/2019 Note  Final    Comment:    ==================================================================== [employer] Select 13 (MW) ==================================================================== Test                             Result  Flag       Units  Drug Present and Declared for Prescription Verification   Alprazolam                     79           EXPECTED   ng/mg creat   Alpha-hydroxyalprazolam        80           EXPECTED   ng/mg creat    Source of alprazolam is a scheduled prescription medication. Alpha-    hydroxyalprazolam is an expected metabolite of alprazolam.  Drug Absent but Declared for Prescription Verification   Hydrocodone                    Not Detected UNEXPECTED ng/mg  creat ==================================================================== Test                      Result    Flag   Units      Ref Range   Creatinine              70               mg/dL      >=20 ==================================================================== Declared Medications:  The flagging and interpretation on this report are based on the  following declared medications.  Unexpected results may arise from  inaccuracies in the declared medications.   **Note: The testing scope of this panel includes these medications:   Alprazolam (Xanax)  Hydrocodone   **Note: The testing scope of this panel does not include the  following reported medications:   Acetaminophen (Tylenol)  Acetaminophen  Albuterol  Amlodipine (Norvasc)  Bupropion (Wellbutrin XL)  Dabigatran (Pradaxa)  Duloxetine  Estradiol (Estrace)  Fesoterodine (Toviaz)  Fluticasone (Advair)  Hydrochlorothiazide (Hyzaar)  Levocetirizine (Xyzal)  Levofloxacin  Levothyroxine  Losartan (Hyzaar)  Metformin  Metoprolol  Montelukast  Nystatin  Ondansetron  Pantoprazole  Pregabalin (Lyrica)  Salmeterol (Advair)  Topical  Vitamin D2 (Ergocalciferol)  Zinc Oxide ==================================================================== For clinical consultation, please call [phone]. ====================================================================      ROS  Constitutional: Denies any fever or chills Gastrointestinal: No reported hemesis, hematochezia, vomiting, or acute GI distress Musculoskeletal: Denies any acute onset joint swelling, redness, loss of ROM, or weakness Neurological: No reported episodes of acute onset apraxia, aphasia, dysarthria, agnosia, amnesia, paralysis, loss of coordination, or loss of consciousness  Medication Review  ALPRAZolam, Cholecalciferol, DULoxetine, HYDROcodone-acetaminophen, Thiamine HCl, Tub Transfer Board, Vitamin D3, acetaminophen, albuterol, apixaban, buPROPion,  calcium-vitamin D, clobetasol cream, colchicine, cyanocobalamin, diclofenac Sodium, ergocalciferol, estradiol, fesoterodine, fluticasone-salmeterol, folic acid, levocetirizine, levothyroxine, losartan-hydrochlorothiazide, metFORMIN, metoprolol succinate, montelukast, multivitamin with minerals, ondansetron, oxybutynin, pantoprazole, pregabalin, rosuvastatin, sitaGLIPtin, sucralfate, thiamine, and trospium  History Review  Allergy: Ms. Viele is allergic to morphine and related, penicillins, ambien [zolpidem], aspirin, and oxytetracycline. Drug: Ms. Sferrazza  reports no history of drug use. Alcohol:  reports no history of alcohol use. Tobacco:  reports that she has never smoked. She has never used smokeless tobacco. Social: Ms. Fazzino  reports that she has never smoked. She has never used smokeless tobacco. She reports that she does not drink alcohol and does not use drugs. Medical:  has a past medical history of Abnormal antibody titer, Anxiety and depression, Arthritis, Asthma, Cystocele, Depression, Diabetes ([place]), DVT (deep venous thrombosis) ([place]), Dyspnea, Dysrhythmia, Edema, GERD (gastroesophageal reflux disease), Heart murmur, History of IBS, History of kidney stones, History of methicillin resistant staphylococcus aureus (MRSA) (2008), Hyperlipidemia, Hypertension,  Hypothyroidism, Insomnia, Kidney stone, Neuropathy involving both lower extremities, Obesity, Class II, BMI 35-39.9, with comorbidity, Paroxysmal atrial fibrillation ([place]) (2007), Peripheral vascular disease ([place]), Sleep apnea, Urinary incontinence, and Venous stasis dermatitis of both lower extremities. Surgical: Ms. Bihl  has a past surgical history that includes Ankle surgery (Right); Vaginal hysterectomy; Sleeve Gastroplasty; Lithotripsy; Kidney surgery (Right); transthoracic echocardiogram (08/03/2013); transesophageal echocardiogram (08/08/2013); Tonsillectomy; Total knee arthroplasty (Left); Hiatal hernia repair;  Cholecystectomy; I & D extremity (Left, 06/27/2015); Application if wound vac (Left, 06/27/2015); removal of left hematoma (Left); NM GATED MYOVIEW [facility] HX) (February 2017); Reverse shoulder arthroplasty (Right, 10/08/2015); Hernia repair; Ulnar nerve transposition (Right, 08/06/2016); arm surgery; Irrigation and debridement abscess (Left, 09/07/2016); Incision and drainage of wound (Left, 09/25/2016); Application if wound vac (Left, 09/25/2016); Colonoscopy; Upper gi endoscopy; Esophagogastroduodenoscopy (egd) with propofol (N/A, 01/11/2017); Colonoscopy with propofol (N/A, 01/11/2017); Colonoscopy with propofol (N/A, 02/22/2017); OTHER SURGICAL HISTORY; Total shoulder replacement (Right); Joint replacement (2013); Colonoscopy with propofol (N/A, 05/31/2017); Lower Extremity Angiography (Left, 04/04/2018); Esophagogastroduodenoscopy (egd) with propofol (N/A, 10/25/2018); Incision and drainage of wound (Left, 11/09/2018); and Esophagogastroduodenoscopy (egd) with propofol (N/A, 11/29/2019). Family: family history includes Alcohol abuse in her father; Arthritis in her maternal grandfather, maternal grandmother, mother, paternal grandfather, and paternal grandmother; Breast cancer in her maternal aunt; Cancer in her father and maternal aunt; Cerebral aneurysm in her father; Diabetes in her father, maternal grandmother, and paternal grandmother; Heart disease in her maternal grandfather; Hypertension in her father, maternal grandfather, maternal grandmother, paternal grandfather, and paternal grandmother; Kidney disease in her mother; Mental illness in her sister; Peripheral vascular disease in her father; Skin cancer in her father; Varicose Veins in her mother.  Laboratory Chemistry Profile   Renal Lab Results  Component Value Date   BUN 25 (H) 04/14/2020   CREATININE 0.86 04/14/2020   BCR 16 11/09/2019   GFR 41.60 (L) 09/14/2019   GFRAA 58 (L) 12/25/2019   GFRNONAA >60 04/14/2020     Hepatic Lab Results  Component  Value Date   AST 18 04/05/2020   ALT 14 04/05/2020   ALBUMIN 3.3 (L) 04/05/2020   ALKPHOS 88 04/05/2020   LIPASE 27 07/28/2018   AMMONIA <9 (L) 02/07/2020     Electrolytes Lab Results  Component Value Date   NA 138 04/14/2020   K 4.7 04/14/2020   CL 101 04/14/2020   CALCIUM 8.8 (L) 04/14/2020   MG 2.1 04/14/2020   PHOS 4.1 04/14/2020     Bone Lab Results  Component Value Date   VD25OH 26.5 (L) 02/07/2020     Inflammation (CRP: Acute Phase) (ESR: Chronic Phase) Lab Results  Component Value Date   CRP 3.1 (H) 04/08/2020   ESRSEDRATE 60 (H) 04/08/2020   LATICACIDVEN 1.1 12/20/2019       Note: Above Lab results reviewed.  Recent Imaging Review  VAS [place] ABI WITH/WO TBI LOWER EXTREMITY DOPPLER STUDY  Indications: Rest pain, and ulceration.  High Risk Factors: Hypertension.   Comparison Study: 03/22/2018  Performing Technologist: Concha Norway RVT    Examination Guidelines: A complete evaluation includes at minimum, Doppler waveform signals and systolic blood pressure reading at the level of bilateral brachial, anterior tibial, and posterior tibial arteries, when vessel segments are accessible. Bilateral testing is considered an integral part of a complete examination. Photoelectric Plethysmograph (PPG) waveforms and toe systolic pressure readings are included as required and additional duplex testing as needed. Limited examinations for reoccurring indications may be performed as noted.    ABI Findings: +---------+------------------+-----+---------+--------+ Right  Rt Pressure (mmHg)IndexWaveform Comment  +---------+------------------+-----+---------+--------+ Brachial 132                                      +---------+------------------+-----+---------+--------+ ATA      149               1.13 triphasic         +---------+------------------+-----+---------+--------+ PTA      141               1.07 triphasic          +---------+------------------+-----+---------+--------+ Great Toe127               0.96 Normal            +---------+------------------+-----+---------+--------+  +---------+------------------+-----+--------+-------+ Left     Lt Pressure (mmHg)IndexWaveformComment +---------+------------------+-----+--------+-------+ ATA      139               1.05                 +---------+------------------+-----+--------+-------+ PTA      161               1.22                 +---------+------------------+-----+--------+-------+ Great Toe94                0.71 Abnormal        +---------+------------------+-----+--------+-------+  +-------+-----------+-----------+------------+------------+ ABI/TBIToday's ABIToday's TBIPrevious ABIPrevious TBI +-------+-----------+-----------+------------+------------+ Right  1.13       .96        1.21        .76          +-------+-----------+-----------+------------+------------+ Left   1.22       .71        1.15        .89          +-------+-----------+-----------+------------+------------+  Bilateral ABIs and TBIs appear essentially unchanged compared to prior study on 02/2018.   Summary: Right: Resting right ankle-brachial index is within normal range. No evidence of significant right lower extremity arterial disease. The right toe-brachial index is normal.  Left: Resting left ankle-brachial index is within normal range. No evidence of significant left lower extremity arterial disease. The left toe-brachial index is normal.    *See table(s) above for measurements and observations.    Electronically signed by Leotis Pain MD on 06/28/2020 at 10:09:21 AM.       Final   Note: Reviewed        Physical Exam  General appearance: Well nourished, well developed, and well hydrated. In no apparent acute distress Mental status: Alert, oriented x 3 (person, place, & time)       Respiratory: No evidence of acute  respiratory distress Eyes: PERLA Vitals: BP 108/84   Pulse 60   Temp (!) 97.2 F (36.2 C) (Temporal)   Resp 16   Ht '5\' 5"'  (1.651 m)   Wt 215 lb (97.5 kg)   SpO2 92%   BMI 35.78 kg/m  BMI: Estimated body mass index is 35.78 kg/m as calculated from the following:   Height as of this encounter: '5\' 5"'  (1.651 m).   Weight as of this encounter: 215 lb (97.5 kg). Ideal: Ideal body weight: 57 kg (125 lb 10.6 oz) Adjusted ideal body weight: 73.2 kg (161 lb 6.4 oz)  Assessment   Status Diagnosis  Controlled Controlled Controlled 1. Chronic  pain syndrome   2. History of stroke with current residual effects   3. Cognitive deficit, post-stroke   4. Fibromyalgia syndrome   5. Diabetic peripheral neuropathy associated with type 2 diabetes mellitus ([place])   6. Chronic peripheral neuropathic pain   7. Depression with anxiety   8. Chronic atrial fibrillation   9. Pharmacologic therapy   10. Chronic anticoagulation (Eliquis)   11. Chronic prescription benzodiazepine use   12. Chronic use of opiate for therapeutic purpose   13. Uncomplicated opioid dependence ([place])      Updated Problems: Problem  Uncomplicated Opioid Dependence (Hcc)  Cognitive Deficit, Post-Stroke    Plan of Care  Problem-specific:  No problem-specific Assessment & Plan notes found for this encounter.  Ms. YAMILI LICHTENWALNER has a current medication list which includes the following long-term medication(s): apixaban, bupropion, calcium-vitamin d, colchicine, duloxetine, advair hfa, [START ON 07/31/2020] hydrocodone-acetaminophen, [START ON 08/30/2020] hydrocodone-acetaminophen, [START ON 09/29/2020] hydrocodone-acetaminophen, levocetirizine, levothyroxine, metformin, metoprolol succinate, montelukast, pregabalin, rosuvastatin, and sitagliptin.  Pharmacotherapy (Medications Ordered): Meds ordered this encounter  Medications  . HYDROcodone-acetaminophen (NORCO/VICODIN) 5-325 MG tablet    Sig: Take 1 tablet by mouth every 8  (eight) hours as needed for severe pain. Must last 30 days    Dispense:  90 tablet    Refill:  0    Not a duplicate. Do NOT delete! Dispense 1 day early if closed on refill date. Avoid benzodiazepines within 8 hours of opioids. Do not send refill requests.  Marland Kitchen HYDROcodone-acetaminophen (NORCO/VICODIN) 5-325 MG tablet    Sig: Take 1 tablet by mouth every 8 (eight) hours as needed for severe pain. Must last 30 days    Dispense:  90 tablet    Refill:  0    Not a duplicate. Do NOT delete! Dispense 1 day early if closed on refill date. Avoid benzodiazepines within 8 hours of opioids. Do not send refill requests.  Marland Kitchen HYDROcodone-acetaminophen (NORCO/VICODIN) 5-325 MG tablet    Sig: Take 1 tablet by mouth every 8 (eight) hours as needed for severe pain. Must last 30 days    Dispense:  90 tablet    Refill:  0    Not a duplicate. Do NOT delete! Dispense 1 day early if closed on refill date. Avoid benzodiazepines within 8 hours of opioids. Do not send refill requests.  . DULoxetine (CYMBALTA) 60 MG capsule    Sig: Take 1 capsule (60 mg total) by mouth daily.    Dispense:  30 capsule    Refill:  0    Fill one day early if pharmacy is closed on scheduled refill date. Generic permitted. Do not send renewal requests. Void any older duplicate prescription or refill(s) that may be on file.  . pregabalin (LYRICA) 50 MG capsule    Sig: Take 1 capsule (50 mg total) by mouth 3 (three) times daily.    Dispense:  90 capsule    Refill:  0    Fill one day early if pharmacy is closed on scheduled refill date. Generic permitted. Do not send renewal requests. Void any older duplicate prescription or refill(s) that may be on file.   Orders:  No orders of the defined types were placed in this encounter.  Follow-up plan:   Return in about 3 months (around 10/29/2020) for (F2F), (MM).      Interventional therapies: Planned, scheduled, and/or pending:   Not at this time. (Always stop Pradaxa x 5 days prior to any  blocks.)   Considering:   NOTE:  PRADAXA ANTICOAGULATION (Stop: 5 days  Restart: 6 hours) NO LUMBAR RFA until BMI < 35. Diagnostic right suprascapular NB Diagnostic right IA knee injection  Possible series of 5 Hyalgan knee injections Diagnostic right-sided genicular NB Possible right-sided genicular nerve RFA  Diagnostic right-sided suprascapular NB  Possible right-sided suprascapular nerve RFA    Palliative PRN treatment(s):   Palliative right IA shoulder joint injection  Palliative right lateral femoral cutaneous NB Palliative right lateral femoral continuous nerve RFA #2       Recent Visits Date Type Provider Dept  06/05/20 Telemedicine Milinda Pointer, MD [facility]  Showing recent visits within past 90 days and meeting all other requirements Today's Visits Date Type Provider Dept  07/15/20 Office Visit Milinda Pointer, MD [facility]  Showing today's visits and meeting all other requirements Future Appointments No visits were found meeting these conditions. Showing future appointments within next 90 days and meeting all other requirements  I discussed the assessment and treatment plan with the patient. The patient was provided an opportunity to ask questions and all were answered. The patient agreed with the plan and demonstrated an understanding of the instructions.  Patient advised to call back or seek an in-person evaluation if the symptoms or condition worsens.  Duration of encounter: 30 minutes.  Note by: Gaspar Cola, MD Date: 07/15/2020; Time: 4:21 PM

## 2020-07-15 ENCOUNTER — Ambulatory Visit: Payer: Medicare HMO | Attending: Pain Medicine | Admitting: Pain Medicine

## 2020-07-15 ENCOUNTER — Encounter: Payer: Self-pay | Admitting: Pain Medicine

## 2020-07-15 ENCOUNTER — Other Ambulatory Visit: Payer: Self-pay

## 2020-07-15 VITALS — BP 108/84 | HR 60 | Temp 97.2°F | Resp 16 | Ht 65.0 in | Wt 215.0 lb

## 2020-07-15 DIAGNOSIS — G8929 Other chronic pain: Secondary | ICD-10-CM | POA: Insufficient documentation

## 2020-07-15 DIAGNOSIS — I482 Chronic atrial fibrillation, unspecified: Secondary | ICD-10-CM | POA: Insufficient documentation

## 2020-07-15 DIAGNOSIS — M797 Fibromyalgia: Secondary | ICD-10-CM | POA: Insufficient documentation

## 2020-07-15 DIAGNOSIS — G894 Chronic pain syndrome: Secondary | ICD-10-CM | POA: Insufficient documentation

## 2020-07-15 DIAGNOSIS — I69319 Unspecified symptoms and signs involving cognitive functions following cerebral infarction: Secondary | ICD-10-CM | POA: Diagnosis not present

## 2020-07-15 DIAGNOSIS — F418 Other specified anxiety disorders: Secondary | ICD-10-CM | POA: Insufficient documentation

## 2020-07-15 DIAGNOSIS — Z79891 Long term (current) use of opiate analgesic: Secondary | ICD-10-CM | POA: Diagnosis present

## 2020-07-15 DIAGNOSIS — Z79899 Other long term (current) drug therapy: Secondary | ICD-10-CM | POA: Diagnosis not present

## 2020-07-15 DIAGNOSIS — Z7901 Long term (current) use of anticoagulants: Secondary | ICD-10-CM | POA: Insufficient documentation

## 2020-07-15 DIAGNOSIS — M792 Neuralgia and neuritis, unspecified: Secondary | ICD-10-CM | POA: Diagnosis not present

## 2020-07-15 DIAGNOSIS — E1142 Type 2 diabetes mellitus with diabetic polyneuropathy: Secondary | ICD-10-CM | POA: Diagnosis not present

## 2020-07-15 DIAGNOSIS — R69 Illness, unspecified: Secondary | ICD-10-CM | POA: Diagnosis not present

## 2020-07-15 DIAGNOSIS — F1120 Opioid dependence, uncomplicated: Secondary | ICD-10-CM

## 2020-07-15 DIAGNOSIS — I6930 Unspecified sequelae of cerebral infarction: Secondary | ICD-10-CM | POA: Diagnosis not present

## 2020-07-15 MED ORDER — HYDROCODONE-ACETAMINOPHEN 5-325 MG PO TABS: 1.0000 | ORAL_TABLET | Freq: Three times a day (TID) | ORAL | 0 refills | Status: DC | PRN

## 2020-07-15 MED ORDER — DULOXETINE HCL 60 MG PO CPEP
60.0000 mg | ORAL_CAPSULE | Freq: Every day | ORAL | 0 refills | Status: DC
Start: 2020-07-15 — End: 2020-08-22

## 2020-07-15 MED ORDER — PREGABALIN 50 MG PO CAPS: 50.0000 mg | ORAL_CAPSULE | Freq: Three times a day (TID) | ORAL | 0 refills | Status: DC

## 2020-07-15 NOTE — Patient Instructions (Signed)
____________________________________________________________________________________________  Medication Recommendations and Reminders  Applies to: All patients receiving prescriptions (written and/or electronic).  Medication Rules & Regulations: These rules and regulations exist for your safety and that of others. They are not flexible and neither are we. Dismissing or ignoring them will be considered "non-compliance" with medication therapy, resulting in complete and irreversible termination of such therapy. (See document titled "Medication Rules" for more details.) In all conscience, because of safety reasons, we cannot continue providing a therapy where the patient does not follow instructions.  Pharmacy of record:   Definition: This is the pharmacy where your electronic prescriptions will be sent.   We do not endorse any particular pharmacy, however, we have experienced problems with [facility] not securing enough medication supply for the community.  We do not restrict you in your choice of pharmacy. However, once we write for your prescriptions, we will NOT be re-sending more prescriptions to fix restricted supply problems created by your pharmacy, or your insurance.   The pharmacy listed in the electronic medical record should be the one where you want electronic prescriptions to be sent.  If you choose to change pharmacy, simply notify our nursing staff.  Recommendations:  Keep all of your pain medications in a safe place, under lock and key, even if you live alone. We will NOT replace lost, stolen, or damaged medication.  After you fill your prescription, take 1 week's worth of pills and put them away in a safe place. You should keep a separate, properly labeled bottle for this purpose. The remainder should be kept in the original bottle. Use this as your primary supply, until it runs out. Once it's gone, then you know that you have 1 week's worth of medicine, and it is time to come  in for a prescription refill. If you do this correctly, it is unlikely that you will ever run out of medicine.  To make sure that the above recommendation works, it is very important that you make sure your medication refill appointments are scheduled at least 1 week before you run out of medicine. To do this in an effective manner, make sure that you do not leave the office without scheduling your next medication management appointment. Always ask the nursing staff to show you in your prescription , when your medication will be running out. Then arrange for the receptionist to get you a return appointment, at least 7 days before you run out of medicine. Do not wait until you have 1 or 2 pills left, to come in. This is very poor planning and does not take into consideration that we may need to cancel appointments due to bad weather, sickness, or emergencies affecting our staff.  DO NOT ACCEPT A "Partial Fill": If for any reason your pharmacy does not have enough pills/tablets to completely fill or refill your prescription, do not allow for a "partial fill". The law allows the pharmacy to complete that prescription within 72 hours, without requiring a new prescription. If they do not fill the rest of your prescription within those 72 hours, you will need a separate prescription to fill the remaining amount, which we will NOT provide. If the reason for the partial fill is your insurance, you will need to talk to the pharmacist about payment alternatives for the remaining tablets, but again, DO NOT ACCEPT A PARTIAL FILL, unless you can trust your pharmacist to obtain the remainder of the pills within 72 hours.  Prescription refills and/or changes in medication(s):     Prescription refills, and/or changes in dose or medication, will be conducted only during scheduled medication management appointments. (Applies to both, written and electronic prescriptions.)  No refills on procedure days. No medication will be  changed or started on procedure days. No changes, adjustments, and/or refills will be conducted on a procedure day. Doing so will interfere with the diagnostic portion of the procedure.  No phone refills. No medications will be "called into the pharmacy".  No Fax refills.  No weekend refills.  No Holliday refills.  No after hours refills.  Remember:  Business hours are:  Monday to Thursday 8:00 AM to 4:00 PM Provider's Schedule: Emerson Barretto, MD - Appointments are:  Medication management: Monday and Wednesday 8:00 AM to 4:00 PM Procedure day: Tuesday and Thursday 7:30 AM to 4:00 PM Bilal Lateef, MD - Appointments are:  Medication management: Tuesday and Thursday 8:00 AM to 4:00 PM Procedure day: Monday and Wednesday 7:30 AM to 4:00 PM (Last update: 10/11/2019) ____________________________________________________________________________________________   ____________________________________________________________________________________________  Medication Rules  Purpose: To inform patients, and their family members, of our rules and regulations.  Applies to: All patients receiving prescriptions (written or electronic).  Pharmacy of record: Pharmacy where electronic prescriptions will be sent. If written prescriptions are taken to a different pharmacy, please inform the nursing staff. The pharmacy listed in the electronic medical record should be the one where you would like electronic prescriptions to be sent.  Electronic prescriptions: In compliance with the [place] Strengthen Opioid Misuse Prevention (STOP) Act of 2017 (Session Law 2017-74/H243), effective March 23, 2018, all controlled substances must be electronically prescribed. Calling prescriptions to the pharmacy will cease to exist.  Prescription refills: Only during scheduled appointments. Applies to all prescriptions.  NOTE: The following applies primarily to controlled substances (Opioid* Pain  Medications).   Type of encounter (visit): For patients receiving controlled substances, face-to-face visits are required. (Not an option or up to the patient.)  Patient's responsibilities: 1. Pain Pills: Bring all pain pills to every appointment (except for procedure appointments). 2. Pill Bottles: Bring pills in original pharmacy bottle. Always bring the newest bottle. Bring bottle, even if empty. 3. Medication refills: You are responsible for knowing and keeping track of what medications you take and those you need refilled. The day before your appointment: write a list of all prescriptions that need to be refilled. The day of the appointment: give the list to the admitting nurse. Prescriptions will be written only during appointments. No prescriptions will be written on procedure days. If you forget a medication: it will not be "Called in", "Faxed", or "electronically sent". You will need to get another appointment to get these prescribed. No early refills. Do not call asking to have your prescription filled early. 4. Prescription Accuracy: You are responsible for carefully inspecting your prescriptions before leaving our office. Have the discharge nurse carefully go over each prescription with you, before taking them home. Make sure that your name is accurately spelled, that your address is correct. Check the name and dose of your medication to make sure it is accurate. Check the number of pills, and the written instructions to make sure they are clear and accurate. Make sure that you are given enough medication to last until your next medication refill appointment. 5. Taking Medication: Take medication as prescribed. When it comes to controlled substances, taking less pills or less frequently than prescribed is permitted and encouraged. Never take more pills than instructed. Never take medication more frequently than prescribed.  6.   Inform other Doctors: Always inform, all of your healthcare  providers, of all the medications you take. 7. Pain Medication from other Providers: You are not allowed to accept any additional pain medication from any other Doctor or Healthcare provider. There are two exceptions to this rule. (see below) In the event that you require additional pain medication, you are responsible for notifying us, as stated below. 8. Cough Medicine: Often these contain an opioid, such as codeine or hydrocodone. Never accept or take cough medicine containing these opioids if you are already taking an opioid* medication. The combination may cause respiratory failure and death. 9. Medication Agreement: You are responsible for carefully reading and following our Medication Agreement. This must be signed before receiving any prescriptions from our practice. Safely store a copy of your signed Agreement. Violations to the Agreement will result in no further prescriptions. (Additional copies of our Medication Agreement are available upon request.) 10. Laws, Rules, & Regulations: All patients are expected to follow all Federal and State Laws, Statutes, Rules, & Regulations. Ignorance of the Laws does not constitute a valid excuse.  11. Illegal drugs and Controlled Substances: The use of illegal substances (including, but not limited to marijuana and its derivatives) and/or the illegal use of any controlled substances is strictly prohibited. Violation of this rule may result in the immediate and permanent discontinuation of any and all prescriptions being written by our practice. The use of any illegal substances is prohibited. 12. Adopted CDC guidelines & recommendations: Target dosing levels will be at or below 60 MME/day. Use of benzodiazepines** is not recommended.  Exceptions: There are only two exceptions to the rule of not receiving pain medications from other Healthcare Providers. 1. Exception #1 (Emergencies): In the event of an emergency (i.e.: accident requiring emergency care), you  are allowed to receive additional pain medication. However, you are responsible for: As soon as you are able, call our office [phone], at any time of the day or night, and leave a message stating your name, the date and nature of the emergency, and the name and dose of the medication prescribed. In the event that your call is answered by a member of our staff, make sure to document and save the date, time, and the name of the person that took your information.  2. Exception #2 (Planned Surgery): In the event that you are scheduled by another doctor or dentist to have any type of surgery or procedure, you are allowed (for a period no longer than 30 days), to receive additional pain medication, for the acute post-op pain. However, in this case, you are responsible for picking up a copy of our "Post-op Pain Management for Surgeons" handout, and giving it to your surgeon or dentist. This document is available at our office, and does not require an appointment to obtain it. Simply go to our office during business hours (Monday-Thursday from 8:00 AM to 4:00 PM) (Friday 8:00 AM to 12:00 Noon) or if you have a scheduled appointment with us, prior to your surgery, and ask for it by name. In addition, you are responsible for: calling our office [phone], at any time of the day or night, and leaving a message stating your name, name of your surgeon, type of surgery, and date of procedure or surgery. Failure to comply with your responsibilities may result in termination of therapy involving the controlled substances.  *Opioid medications include: morphine, codeine, oxycodone, oxymorphone, hydrocodone, hydromorphone, meperidine, tramadol, tapentadol, buprenorphine, fentanyl, methadone. **Benzodiazepine medications include:   diazepam (Valium), alprazolam (Xanax), clonazepam (Klonopine), lorazepam (Ativan), clorazepate (Tranxene), chlordiazepoxide (Librium), estazolam (Prosom), oxazepam (Serax), temazepam  (Restoril), triazolam (Halcion) (Last updated: 02/19/2020) ____________________________________________________________________________________________   ____________________________________________________________________________________________  CBD (cannabidiol) WARNING  Applicable to: All individuals currently taking or considering taking CBD (cannabidiol) and, more important, all patients taking opioid analgesic controlled substances (pain medication). (Example: oxycodone; oxymorphone; hydrocodone; hydromorphone; morphine; methadone; tramadol; tapentadol; fentanyl; buprenorphine; butorphanol; dextromethorphan; meperidine; codeine; etc.)  Legal status: CBD remains a Schedule I drug prohibited for any use. CBD is illegal with one exception. In the [place], CBD has a limited Food and Drug Administration (FDA) approval for the treatment of two specific types of epilepsy disorders. Only one CBD product has been approved by the FDA for this purpose: "Epidiolex". FDA is aware that some companies are marketing products containing cannabis and cannabis-derived compounds in ways that violate the Federal Food, Drug and Cosmetic Act (FD&C Act) and that may put the health and safety of consumers at risk. The FDA, a Federal agency, has not enforced the CBD status since 2018.   Legality: Some manufacturers ship CBD products nationally, which is illegal. Often such products are sold online and are therefore available throughout the country. CBD is openly sold in head shops and health food stores in some states where such sales have not been explicitly legalized. Selling unapproved products with unsubstantiated therapeutic claims is not only a violation of the law, but also can put patients at risk, as these products have not been proven to be safe or effective. Federal illegality makes it difficult to conduct research on CBD.  Reference: "FDA Regulation of Cannabis and Cannabis-Derived Products, Including  Cannabidiol (CBD)" - https://www.fda.gov/news-events/public-health-focus/fda-regulation-cannabis-and-cannabis-derived-products-including-cannabidiol-cbd  Warning: CBD is not FDA approved and has not undergo the same manufacturing controls as prescription drugs.  This means that the purity and safety of available CBD may be questionable. Most of the time, despite manufacturer's claims, it is contaminated with THC (delta-9-tetrahydrocannabinol - the chemical in marijuana responsible for the "HIGH").  When this is the case, the THC contaminant will trigger a positive urine drug screen (UDS) test for Marijuana (carboxy-THC). Because a positive UDS for any illicit substance is a violation of our medication agreement, your opioid analgesics (pain medicine) may be permanently discontinued.  MORE ABOUT CBD  General Information: CBD  is a derivative of the Marijuana (cannabis sativa) plant discovered in 1940. It is one of the 113 identified substances found in Marijuana. It accounts for up to 40% of the plant's extract. As of 2018, preliminary clinical studies on CBD included research for the treatment of anxiety, movement disorders, and pain. CBD is available and consumed in multiple forms, including inhalation of smoke or vapor, as an aerosol spray, and by mouth. It may be supplied as an oil containing CBD, capsules, dried cannabis, or as a liquid solution. CBD is thought not to be as psychoactive as THC (delta-9-tetrahydrocannabinol - the chemical in marijuana responsible for the "HIGH"). Studies suggest that CBD may interact with different biological target receptors in the body, including cannabinoid and other neurotransmitter receptors. As of 2018 the mechanism of action for its biological effects has not been determined.  Side-effects  Adverse reactions: Dry mouth, diarrhea, decreased appetite, fatigue, drowsiness, malaise, weakness, sleep disturbances, and others.  Drug interactions: CBC may interact with  other medications such as blood-thinners. (Last update: 10/28/2019) ____________________________________________________________________________________________    

## 2020-07-15 NOTE — Progress Notes (Signed)
Nursing Pain Medication Assessment:  Safety precautions to be maintained throughout the outpatient stay will include: orient to surroundings, keep bed in low position, maintain call bell within reach at all times, provide assistance with transfer out of bed and ambulation.  Medication Inspection Compliance: Pill count conducted under aseptic conditions, in front of the patient. Neither the pills nor the bottle was removed from the patient's sight at any time. Once count was completed pills were immediately returned to the patient in their original bottle.  Medication: Hydrocodone/APAP Pill/Patch Count: 48 of 90 pills remain Pill/Patch Appearance: Markings consistent with prescribed medication Bottle Appearance: Standard pharmacy container. Clearly labeled. Filled Date: 07/01/2020 Last Medication intake:  Today

## 2020-07-17 ENCOUNTER — Ambulatory Visit (INDEPENDENT_AMBULATORY_CARE_PROVIDER_SITE_OTHER): Payer: Medicare HMO | Admitting: Nurse Practitioner

## 2020-07-17 ENCOUNTER — Telehealth: Payer: Self-pay | Admitting: *Deleted

## 2020-07-17 NOTE — Telephone Encounter (Signed)
Patient called in and told me that he thinks I have him mixed up with his wife, that she is the one taking duloxetine, he takes Cymbalta (he had asked for refill this morning).  I told him that this is the same medication.  Then he states well then Angelmarie is taking both of them.  I did check her chart and she does have duloxetine (cymbalta) 60 mg prescribed.  [name] responded, "and she is taking both of them".  When I tried to explain that most medications have 2 names and that perhaps she had 2 different bottles one having duloxetine and one having cymbalta on the label she may be taking both but should only be taking as prescribed which is 60 mg daily.  Patient verbalizes u/o information.   Copied from Husband, Xoey Warmoth phone call from earlier.

## 2020-07-19 ENCOUNTER — Encounter: Payer: Medicare HMO | Admitting: Internal Medicine

## 2020-07-19 ENCOUNTER — Telehealth: Payer: Self-pay | Admitting: Licensed Clinical Social Worker

## 2020-07-19 ENCOUNTER — Other Ambulatory Visit: Payer: Self-pay

## 2020-07-19 DIAGNOSIS — L97322 Non-pressure chronic ulcer of left ankle with fat layer exposed: Secondary | ICD-10-CM | POA: Diagnosis not present

## 2020-07-19 DIAGNOSIS — E11621 Type 2 diabetes mellitus with foot ulcer: Secondary | ICD-10-CM | POA: Diagnosis not present

## 2020-07-19 DIAGNOSIS — L97812 Non-pressure chronic ulcer of other part of right lower leg with fat layer exposed: Secondary | ICD-10-CM | POA: Diagnosis not present

## 2020-07-19 DIAGNOSIS — L97522 Non-pressure chronic ulcer of other part of left foot with fat layer exposed: Secondary | ICD-10-CM | POA: Diagnosis not present

## 2020-07-19 DIAGNOSIS — I872 Venous insufficiency (chronic) (peripheral): Secondary | ICD-10-CM | POA: Diagnosis not present

## 2020-07-19 DIAGNOSIS — Z7901 Long term (current) use of anticoagulants: Secondary | ICD-10-CM | POA: Diagnosis not present

## 2020-07-19 DIAGNOSIS — E1140 Type 2 diabetes mellitus with diabetic neuropathy, unspecified: Secondary | ICD-10-CM | POA: Diagnosis not present

## 2020-07-19 DIAGNOSIS — L97422 Non-pressure chronic ulcer of left heel and midfoot with fat layer exposed: Secondary | ICD-10-CM | POA: Diagnosis not present

## 2020-07-19 DIAGNOSIS — Z86718 Personal history of other venous thrombosis and embolism: Secondary | ICD-10-CM | POA: Diagnosis not present

## 2020-07-19 DIAGNOSIS — I10 Essential (primary) hypertension: Secondary | ICD-10-CM | POA: Diagnosis not present

## 2020-07-19 DIAGNOSIS — E1151 Type 2 diabetes mellitus with diabetic peripheral angiopathy without gangrene: Secondary | ICD-10-CM | POA: Diagnosis not present

## 2020-07-19 NOTE — Telephone Encounter (Signed)
LCSW received call from Event organiser. She is inquiring about pt scheduling a ride to [facility]. I explained that we had given one time approval for pt to get to that appointment, but since it is out of network for [facility], and not related to Heart and Vascular care/ancillary services (pharmacy, food access, etc) that I did not feel I could send that to our cost center past the initial visit. This was explained to pt's husband on the 19th.   It was clarified to this writer that pt cancelled her appt on 22nd, and [facility] confirmed they had not given pt a ride on 22nd. It appears this is a rescheduled appointment. I requested transportation services reschedule ride and then make note of need for alternate rides to [facility] in future. I have sent pt various transportation resources that may be feasible back up options and discussed calling [facility] [place] w/ pt husband as often times they have drivers willing to assist if [facility] transportation is not available. I remain available for f/u questions/concerns.   Westley Hummer, MSW, Time  [phone]

## 2020-07-20 NOTE — Progress Notes (Signed)
[name] ([id number]) Visit Report for 07/19/2020 Arrival Information Details Patient Name: [name], [name] Date of Service: 07/19/2020 10:45 AM Medical Record Number: [id number] Patient Account Number: [id number] Date of Birth/Sex: 1950/05/04 (70 y.o. F) Treating RN: Carlene Coria Primary Care Alechia Lezama: [name], Olivia Mackie Other Clinician: Jeanine Luz Referring Rettie Laird: [name], Olivia Mackie Treating Eoin Willden/Extender: Tito Dine in Treatment: 3 Visit Information History Since Last Visit Added or deleted any medications: No Patient Arrived: Wheel Chair Had a fall or experienced change in No Arrival Time: 11:17 activities of daily living that may affect Accompanied By: husband risk of falls: Transfer Assistance: EasyPivot Patient Lift Hospitalized since last visit: No Patient Identification Verified: Yes Pain Present Now: Yes Secondary Verification Process Completed: Yes Electronic Signature(s) Signed: 07/19/2020 3:13:24 PM By: Jeanine Luz Entered By: Jeanine Luz on 07/19/2020 11:18:07 [name] ([id number]) -------------------------------------------------------------------------------- Clinic Level of Care Assessment Details Patient Name: [name] Date of Service: 07/19/2020 10:45 AM Medical Record Number: [id number] Patient Account Number: [id number] Date of Birth/Sex: 09-01-1950 (70 y.o. F) Treating RN: Carlene Coria Primary Care Troi Florendo: [name], Tamala [name] Clinician: Jeanine Luz Referring Olyver Hawes: [name], Olivia Mackie Treating Sunset Joshi/Extender: Tito Dine in Treatment: 3 Clinic Level of Care Assessment Items TOOL 4 Quantity Score X - Use when only an EandM is performed on FOLLOW-UP visit 1 0 ASSESSMENTS - Nursing Assessment / Reassessment X - Reassessment of Co-morbidities (includes updates in patient status) 1 10 X- 1 5 Reassessment of Adherence to Treatment Plan ASSESSMENTS - Wound  and Skin Assessment / Reassessment []  - Simple Wound Assessment / Reassessment - one wound 0 X- 2 5 Complex Wound Assessment / Reassessment - multiple wounds []  - 0 Dermatologic / Skin Assessment (not related to wound area) ASSESSMENTS - Focused Assessment []  - Circumferential Edema Measurements - multi extremities 0 []  - 0 Nutritional Assessment / Counseling / Intervention []  - 0 Lower Extremity Assessment (monofilament, tuning fork, pulses) []  - 0 Peripheral Arterial Disease Assessment (using hand held doppler) ASSESSMENTS - Ostomy and/or Continence Assessment and Care []  - Incontinence Assessment and Management 0 []  - 0 Ostomy Care Assessment and Management (repouching, etc.) PROCESS - Coordination of Care X - Simple Patient / Family Education for ongoing care 1 15 []  - 0 Complex (extensive) Patient / Family Education for ongoing care X- 1 10 Staff obtains Programmer, systems, Records, Test Results / Process Orders []  - 0 Staff telephones HHA, Nursing Homes / Clarify orders / etc []  - 0 Routine Transfer to another Facility (non-emergent condition) []  - 0 Routine Hospital Admission (non-emergent condition) []  - 0 New Admissions / Biomedical engineer / Ordering NPWT, Apligraf, etc. []  - 0 Emergency Hospital Admission (emergent condition) X- 1 10 Simple Discharge Coordination []  - 0 Complex (extensive) Discharge Coordination PROCESS - Special Needs []  - Pediatric / Minor Patient Management 0 []  - 0 Isolation Patient Management []  - 0 Hearing / Language / Visual special needs []  - 0 Assessment of Community assistance (transportation, D/C planning, etc.) []  - 0 Additional assistance / Altered mentation []  - 0 Support Surface(s) Assessment (bed, cushion, seat, etc.) INTERVENTIONS - Wound Cleansing / Measurement Arrambide, Miniya B. ([id number]) []  - 0 Simple Wound Cleansing - one wound X- 2 5 Complex Wound Cleansing - multiple wounds X- 1 5 Wound Imaging (photographs - any  number of wounds) []  - 0 Wound Tracing (instead of photographs) []  - 0 Simple Wound Measurement - one wound X- 2 5 Complex Wound Measurement - multiple wounds INTERVENTIONS - Wound Dressings  X - Small Wound Dressing one or multiple wounds 2 10 []  - 0 Medium Wound Dressing one or multiple wounds []  - 0 Large Wound Dressing one or multiple wounds X- 1 5 Application of Medications - topical []  - 0 Application of Medications - injection INTERVENTIONS - Miscellaneous []  - External ear exam 0 []  - 0 Specimen Collection (cultures, biopsies, blood, body fluids, etc.) []  - 0 Specimen(s) / Culture(s) sent or taken to Lab for analysis []  - 0 Patient Transfer (multiple staff / Civil Service fast streamer / Similar devices) []  - 0 Simple Staple / Suture removal (25 or less) []  - 0 Complex Staple / Suture removal (26 or more) []  - 0 Hypo / Hyperglycemic Management (close monitor of Blood Glucose) []  - 0 Ankle / Brachial Index (ABI) - do not check if billed separately X- 1 5 Vital Signs Has the patient been seen at the hospital within the last three years: Yes Total Score: 115 Level Of Care: New/Established - Level 3 Electronic Signature(s) Signed: 07/19/2020 5:33:58 PM By: Carlene Coria RN Entered By: Carlene Coria on 07/19/2020 12:08:54 [name] ([id number]) -------------------------------------------------------------------------------- Encounter Discharge Information Details Patient Name: [name] Date of Service: 07/19/2020 10:45 AM Medical Record Number: [id number] Patient Account Number: [id number] Date of Birth/Sex: 12/11/1950 (69 y.o. F) Treating RN: Donnamarie Poag Primary Care Sukanya Goldblatt: [name], Olivia Mackie Other Clinician: Jeanine Luz Referring Czar Ysaguirre: [name], Olivia Mackie Treating Vernica Wachtel/Extender: Tito Dine in Treatment: 3 Encounter Discharge Information Items Discharge Condition: Stable Ambulatory Status: Wheelchair Discharge Destination:  Home Transportation: Private Auto Accompanied By: husband Schedule Follow-up Appointment: Yes Clinical Summary of Care: Electronic Signature(s) Signed: 07/19/2020 4:40:42 PM By: Donnamarie Poag Entered By: Donnamarie Poag on 07/19/2020 12:19:26 [name] ([id number]) -------------------------------------------------------------------------------- Lower Extremity Assessment Details Patient Name: [name] Date of Service: 07/19/2020 10:45 AM Medical Record Number: [id number] Patient Account Number: [id number] Date of Birth/Sex: 1950/07/17 (69 y.o. F) Treating RN: Carlene Coria Primary Care Maudene Stotler: [name], Tamala [name] Clinician: Jeanine Luz Referring Kayton Dunaj: [name], Olivia Mackie Treating Sita Mangen/Extender: Tito Dine in Treatment: 3 Edema Assessment Assessed: [Left: No] [Right: Yes] Edema: [Left: N] [Right: o] Calf Left: Right: Point of Measurement: 30 cm From Medial Instep 30.2 cm Ankle Left: Right: Point of Measurement: 10 cm From Medial Instep 18 cm Vascular Assessment Pulses: Dorsalis Pedis Palpable: [Right:Yes] Electronic Signature(s) Signed: 07/19/2020 3:13:24 PM By: Jeanine Luz Signed: 07/19/2020 5:33:58 PM By: Carlene Coria RN Entered By: Jeanine Luz on 07/19/2020 11:36:16 Bookbinder, [name] ([id number]) -------------------------------------------------------------------------------- Multi Wound Chart Details Patient Name: [name] Date of Service: 07/19/2020 10:45 AM Medical Record Number: [id number] Patient Account Number: [id number] Date of Birth/Sex: May 27, 1950 (70 y.o. F) Treating RN: Carlene Coria Primary Care Alexiah Koroma: [name], Olivia Mackie Other Clinician: Jeanine Luz Referring Oney Tatlock: [name], Olivia Mackie Treating Zhuri Krass/Extender: Tito Dine in Treatment: 3 Vital Signs Height(in): 65.5 Pulse(bpm): 54 Weight(lbs): 240 Blood Pressure(mmHg): 106/62 Body Mass Index(BMI):  39 Temperature(F): 98.1 Respiratory Rate(breaths/min): 18 Photos: Wound Location: Left, Distal Toe Second Left, Posterior Calcaneus Right, Anterior Lower Leg Wounding Event: Pressure Injury Gradually Appeared Shear/Friction Primary Etiology: Diabetic Wound/Ulcer of the Lower Diabetic Wound/Ulcer of the Lower Trauma, Other Extremity Extremity Comorbid History: Asthma, Pneumothorax, Asthma, Pneumothorax, Asthma, Pneumothorax, Arrhythmia, Hypertension, Type II Arrhythmia, Hypertension, Type II Arrhythmia, Hypertension, Type II Diabetes, Gout, Osteoarthritis, Diabetes, Gout, Osteoarthritis, Diabetes, Gout, Osteoarthritis, Neuropathy Neuropathy Neuropathy Date Acquired: 01/05/2020 03/23/2020 06/03/2020 Weeks of Treatment: 3 3 3  Wound Status: Open Open Open Measurements L x W x D (cm) 0.3x0.2x0.14 0.8x0.6x0.1 0x0x0  Area (cm) : 0.047 0.377 0 Volume (cm) : 0.007 0.038 0 % Reduction in Area: 76.00% -1116.10% 100.00% % Reduction in Volume: 65.00% -1166.70% 100.00% Classification: Grade 1 Grade 1 Full Thickness Without Exposed Support Structures Exudate Amount: None Present Small None Present Exudate Type: N/A Serous N/A Exudate Color: N/A amber N/A Wound Margin: N/A Flat and Intact N/A Granulation Amount: Small (1-33%) Small (1-33%) None Present (0%) Granulation Quality: Pink Pink, Pale N/A Necrotic Amount: Small (1-33%) Large (67-100%) None Present (0%) Necrotic Tissue: Eschar N/A N/A Exposed Structures: Fat Layer (Subcutaneous Tissue): Fat Layer (Subcutaneous Tissue): Fascia: No Yes Yes Fat Layer (Subcutaneous Tissue): Fascia: No Fascia: No No Tendon: No Tendon: No Tendon: No Muscle: No Muscle: No Muscle: No Joint: No Joint: No Joint: No Bone: No Bone: No Bone: No Epithelialization: N/A None Large (67-100%) Treatment Notes Wound #6 (Toe Second) Wound Laterality: Left, Distal Cleanser Normal Saline Marchena, Simaya B. ([id number]) Discharge Instruction: Wash your hands  with soap and water. Remove old dressing, discard into plastic bag and place into trash. Cleanse the wound with Normal Saline prior to applying a clean dressing using gauze sponges, not tissues or cotton balls. Do not scrub or use excessive force. Pat dry using gauze sponges, not tissue or cotton balls. Peri-Wound Care Topical Primary Dressing Prisma 4.34 (in) Discharge Instruction: Moisten w/normal saline or sterile water; Cover wound as directed. Do not remove from wound bed. Secondary Dressing Coverlet Latex-Free Fabric Adhesive Dressings Discharge Instruction: 1.5 x 2 Secured With Compression Wrap Compression Stockings Add-Ons Wound #7 (Calcaneus) Wound Laterality: Left, Posterior Cleanser Normal Saline Discharge Instruction: Wash your hands with soap and water. Remove old dressing, discard into plastic bag and place into trash. Cleanse the wound with Normal Saline prior to applying a clean dressing using gauze sponges, not tissues or cotton balls. Do not scrub or use excessive force. Pat dry using gauze sponges, not tissue or cotton balls. Peri-Wound Care Topical Primary Dressing Prisma 4.34 (in) Discharge Instruction: Moisten w/normal saline or sterile water; Cover wound as directed. Do not remove from wound bed. Secondary Dressing Mepilex Border Flex, 4x4 (in/in) Discharge Instruction: Apply to wound as directed. Do not cut. Secured With Compression Wrap Compression Stockings Add-Ons Wound #8 (Lower Leg) Wound Laterality: Right, Anterior Cleanser Peri-Wound Care Topical Primary Dressing Secondary Dressing Secured With Compression Wrap Compression Stockings Add-Ons MIRAY, MANCINO ([id number]) Electronic Signature(s) Signed: 07/19/2020 4:40:00 PM By: Linton Ham MD Entered By: Linton Ham on 07/19/2020 13:07:29 ODETTA, FORNESS ([id number]) -------------------------------------------------------------------------------- Multi-Disciplinary Care Plan  Details Patient Name: [name] Date of Service: 07/19/2020 10:45 AM Medical Record Number: [id number] Patient Account Number: [id number] Date of Birth/Sex: 1950-06-22 (70 y.o. F) Treating RN: Carlene Coria Primary Care Dawnell Bryant: [name], Olivia Mackie Other Clinician: Jeanine Luz Referring Dejanae Helser: [name], Olivia Mackie Treating Terrill Wauters/Extender: Tito Dine in Treatment: 3 Active Inactive Abuse / Safety / Falls / Self Care Management Nursing Diagnoses: Potential for injury related to falls Goals: Patient will remain injury free related to falls Date Initiated: 06/28/2020 Target Resolution Date: 07/28/2020 Goal Status: Active Interventions: Assess Activities of Daily Living upon admission and as needed Assess fall risk on admission and as needed Assess: immobility, friction, shearing, incontinence upon admission and as needed Assess impairment of mobility on admission and as needed per policy Assess personal safety and home safety (as indicated) on admission and as needed Assess self care needs on admission and as needed Notes: Nutrition Nursing Diagnoses: Impaired glucose control: actual or potential Goals: Patient/caregiver verbalizes understanding of  need to maintain therapeutic glucose control per primary care physician Date Initiated: 06/28/2020 Target Resolution Date: 07/28/2020 Goal Status: Active Interventions: Assess HgA1c results as ordered upon admission and as needed Assess patient nutrition upon admission and as needed per policy Notes: Wound/Skin Impairment Nursing Diagnoses: Knowledge deficit related to ulceration/compromised skin integrity Goals: Patient/caregiver will verbalize understanding of skin care regimen Date Initiated: 06/28/2020 Target Resolution Date: 07/28/2020 Goal Status: Active Ulcer/skin breakdown will have a volume reduction of 30% by week 4 Date Initiated: 06/28/2020 Target Resolution Date: 07/28/2020 Goal Status:  Active Ulcer/skin breakdown will have a volume reduction of 50% by week 8 Date Initiated: 06/28/2020 Target Resolution Date: 08/28/2020 Goal Status: Active Ulcer/skin breakdown will have a volume reduction of 80% by week 12 Date Initiated: 06/28/2020 Target Resolution Date: 09/27/2020 TOWANNA, AVERY ([id number]) Goal Status: Active Ulcer/skin breakdown will heal within 14 weeks Date Initiated: 06/28/2020 Target Resolution Date: 10/28/2020 Goal Status: Active Interventions: Assess patient/caregiver ability to obtain necessary supplies Assess patient/caregiver ability to perform ulcer/skin care regimen upon admission and as needed Assess ulceration(s) every visit Notes: Electronic Signature(s) Signed: 07/19/2020 5:33:58 PM By: Carlene Coria RN Entered By: Carlene Coria on 07/19/2020 12:03:04 [name] ([id number]) -------------------------------------------------------------------------------- Pain Assessment Details Patient Name: [name] Date of Service: 07/19/2020 10:45 AM Medical Record Number: [id number] Patient Account Number: [id number] Date of Birth/Sex: 1950/06/17 (70 y.o. F) Treating RN: Carlene Coria Primary Care Sakoya Win: [name], Tamala [name] Clinician: Jeanine Luz Referring Octavian Godek: [name], Olivia Mackie Treating Seraiah Nowack/Extender: Tito Dine in Treatment: 3 Active Problems Location of Pain Severity and Description of Pain Patient Has Paino Yes Site Locations Pain Location: Generalized Pain Rate the pain. Current Pain Level: 7 Pain Management and Medication Current Pain Management: Electronic Signature(s) Signed: 07/19/2020 3:13:24 PM By: Jeanine Luz Signed: 07/19/2020 5:33:58 PM By: Carlene Coria RN Entered By: Jeanine Luz on 07/19/2020 11:24:00 [name] ([id number]) -------------------------------------------------------------------------------- Patient/Caregiver Education Details Patient Name: [name] Date of Service: 07/19/2020 10:45 AM Medical Record Number: [id number] Patient Account Number: [id number] Date of Birth/Gender: Nov 02, 1950 (70 y.o. F) Treating RN: Carlene Coria Primary Care Physician: [name], Olivia Mackie Other Clinician: Jeanine Luz Referring Physician: [name], Olivia Mackie Treating Physician/Extender: Tito Dine in Treatment: 3 Education Assessment Education Provided To: Patient Education Topics Provided Wound/Skin Impairment: Methods: Explain/Verbal Responses: State content correctly Electronic Signature(s) Signed: 07/19/2020 5:33:58 PM By: Carlene Coria RN Entered By: Carlene Coria on 07/19/2020 12:09:10 [name] ([id number]) -------------------------------------------------------------------------------- Wound Assessment Details Patient Name: [name] Date of Service: 07/19/2020 10:45 AM Medical Record Number: [id number] Patient Account Number: [id number] Date of Birth/Sex: [birth year] (70 y.o. F) Treating RN: Carlene Coria Primary Care Mardy Lucier: [name], Tamala [name] Clinician: Jeanine Luz Referring Xavier Munger: [name], Olivia Mackie Treating Triva Hueber/Extender: Tito Dine in Treatment: 3 Wound Status Wound Number: 6 Primary Diabetic Wound/Ulcer of the Lower Extremity Etiology: Wound Location: Left, Distal Toe Second Wound Open Wounding Event: Pressure Injury Status: Date Acquired: 01/05/2020 Comorbid Asthma, Pneumothorax, Arrhythmia, Hypertension, Type Weeks Of Treatment: 3 History: II Diabetes, Gout, Osteoarthritis, Neuropathy Clustered Wound: No Photos Wound Measurements Length: (cm) 0.3 Width: (cm) 0.2 Depth: (cm) 0.14 Area: (cm) 0.047 Volume: (cm) 0.007 % Reduction in Area: 76% % Reduction in Volume: 65% Tunneling: No Undermining: No Wound Description Classification: Grade 1 Exudate Amount: None Present Foul Odor After Cleansing: No Slough/Fibrino Yes Wound Bed Granulation  Amount: Small (1-33%) Exposed Structure Granulation Quality: Pink Fascia Exposed: No Necrotic Amount: Small (1-33%) Fat Layer (Subcutaneous Tissue) Exposed: Yes Necrotic Quality: Eschar  Tendon Exposed: No Muscle Exposed: No Joint Exposed: No Bone Exposed: No Treatment Notes Wound #6 (Toe Second) Wound Laterality: Left, Distal Cleanser Normal Saline Discharge Instruction: Wash your hands with soap and water. Remove old dressing, discard into plastic bag and place into trash. Cleanse the wound with Normal Saline prior to applying a clean dressing using gauze sponges, not tissues or cotton balls. Do not scrub or use excessive force. Pat dry using gauze sponges, not tissue or cotton balls. Peri-Wound Care CHELSE, GUNNER B. ([id number]) Topical Primary Dressing Prisma 4.34 (in) Discharge Instruction: Moisten w/normal saline or sterile water; Cover wound as directed. Do not remove from wound bed. Secondary Dressing Coverlet Latex-Free Fabric Adhesive Dressings Discharge Instruction: 1.5 x 2 Secured With Compression Wrap Compression Stockings Add-Ons Electronic Signature(s) Signed: 07/19/2020 3:13:24 PM By: Jeanine Luz Signed: 07/19/2020 5:33:58 PM By: Carlene Coria RN Entered By: Jeanine Luz on 07/19/2020 11:34:05 Panetta, [name] ([id number]) -------------------------------------------------------------------------------- Wound Assessment Details Patient Name: [name] Date of Service: 07/19/2020 10:45 AM Medical Record Number: [id number] Patient Account Number: [id number] Date of Birth/Sex: 1950/08/09 (69 y.o. F) Treating RN: Carlene Coria Primary Care Belina Mandile: [name], Olivia Mackie Other Clinician: Jeanine Luz Referring Donise Woodle: [name], Olivia Mackie Treating Hitomi Slape/Extender: Tito Dine in Treatment: 3 Wound Status Wound Number: 7 Primary Diabetic Wound/Ulcer of the Lower Extremity Etiology: Wound Location: Left, Posterior  Calcaneus Wound Open Wounding Event: Gradually Appeared Status: Date Acquired: 03/23/2020 Comorbid Asthma, Pneumothorax, Arrhythmia, Hypertension, Type Weeks Of Treatment: 3 History: II Diabetes, Gout, Osteoarthritis, Neuropathy Clustered Wound: No Photos Wound Measurements Length: (cm) 0.8 Width: (cm) 0.6 Depth: (cm) 0.1 Area: (cm) 0.377 Volume: (cm) 0.038 % Reduction in Area: -1116.1% % Reduction in Volume: -1166.7% Epithelialization: None Tunneling: No Undermining: No Wound Description Classification: Grade 1 Wound Margin: Flat and Intact Exudate Amount: Small Exudate Type: Serous Exudate Color: amber Foul Odor After Cleansing: No Slough/Fibrino Yes Wound Bed Granulation Amount: Small (1-33%) Exposed Structure Granulation Quality: Pink, Pale Fascia Exposed: No Necrotic Amount: Large (67-100%) Fat Layer (Subcutaneous Tissue) Exposed: Yes Tendon Exposed: No Muscle Exposed: No Joint Exposed: No Bone Exposed: No Treatment Notes Wound #7 (Calcaneus) Wound Laterality: Left, Posterior Cleanser Normal Saline Discharge Instruction: Wash your hands with soap and water. Remove old dressing, discard into plastic bag and place into trash. Cleanse the wound with Normal Saline prior to applying a clean dressing using gauze sponges, not tissues or cotton balls. Do not scrub or use excessive force. Pat dry using gauze sponges, not tissue or cotton balls. LAURIANNE, QUERTERMOUS ([id number]) Peri-Wound Care Topical Primary Dressing Prisma 4.34 (in) Discharge Instruction: Moisten w/normal saline or sterile water; Cover wound as directed. Do not remove from wound bed. Secondary Dressing Mepilex Border Flex, 4x4 (in/in) Discharge Instruction: Apply to wound as directed. Do not cut. Secured With Compression Wrap Compression Stockings Add-Ons Electronic Signature(s) Signed: 07/19/2020 3:13:24 PM By: Jeanine Luz Signed: 07/19/2020 5:33:58 PM By: Carlene Coria RN Entered By:  Jeanine Luz on 07/19/2020 11:34:49 [place], [name] ([id number]) -------------------------------------------------------------------------------- Wound Assessment Details Patient Name: [name] Date of Service: 07/19/2020 10:45 AM Medical Record Number: [id number] Patient Account Number: [id number] Date of Birth/Sex: [birth year] (70 y.o. F) Treating RN: Carlene Coria Primary Care Neelah Mannings: [name], Tamala [name] Clinician: Jeanine Luz Referring Makela Niehoff: [name], Olivia Mackie Treating Yatzary Merriweather/Extender: Tito Dine in Treatment: 3 Wound Status Wound Number: 8 Primary Trauma, Other Etiology: Wound Location: Right, Anterior Lower Leg Wound Open Wounding Event: Shear/Friction Status: Date Acquired: 06/03/2020 Comorbid Asthma, Pneumothorax, Arrhythmia, Hypertension, Type Weeks Of  Treatment: 3 History: II Diabetes, Gout, Osteoarthritis, Neuropathy Clustered Wound: No Photos Wound Measurements Length: (cm) 0 Width: (cm) 0 Depth: (cm) 0 Area: (cm) 0 Volume: (cm) 0 % Reduction in Area: 100% % Reduction in Volume: 100% Epithelialization: Large (67-100%) Tunneling: No Undermining: No Wound Description Classification: Full Thickness Without Exposed Support Structure Exudate Amount: None Present s Foul Odor After Cleansing: No Slough/Fibrino No Wound Bed Granulation Amount: None Present (0%) Exposed Structure Necrotic Amount: None Present (0%) Fascia Exposed: No Fat Layer (Subcutaneous Tissue) Exposed: No Tendon Exposed: No Muscle Exposed: No Joint Exposed: No Bone Exposed: No Electronic Signature(s) Signed: 07/19/2020 5:33:58 PM By: Carlene Coria RN Entered By: Carlene Coria on 07/19/2020 12:07:36 [name] ([id number]) -------------------------------------------------------------------------------- [place] Details Patient Name: [name] Date of Service: 07/19/2020 10:45 AM Medical Record Number: [id number] Patient Account  Number: [id number] Date of Birth/Sex: 17-Feb-1951 (70 y.o. F) Treating RN: Carlene Coria Primary Care Monette Omara: [name], Tamala [name] Clinician: Jeanine Luz Referring Kindall Swaby: [name], Olivia Mackie Treating Haizel Gatchell/Extender: Tito Dine in Treatment: 3 Vital Signs Time Taken: 11:18 Temperature (F): 98.1 Height (in): 65.5 Pulse (bpm): 59 Weight (lbs): 240 Respiratory Rate (breaths/min): 18 Body Mass Index (BMI): 39.3 Blood Pressure (mmHg): 106/62 Reference Range: 80 - 120 mg / dl Electronic Signature(s) Signed: 07/19/2020 3:13:24 PM By: Jeanine Luz Entered By: Jeanine Luz on 07/19/2020 11:23:54

## 2020-07-20 NOTE — Progress Notes (Signed)
ASHTEN, SARNOWSKI ([id number]) Visit Report for 07/19/2020 HPI Details Patient Name: [name], [name] Date of Service: 07/19/2020 10:45 AM Medical Record Number: [id number] Patient Account Number: [id number] Date of Birth/Sex: 1950-07-28 (70 y.o. F) Treating RN: Carlene Coria Primary Care Provider: [name], Tamala [name] Clinician: Jeanine Luz Referring Provider: [name], Olivia Mackie Treating Provider/Extender: Tito Dine in Treatment: 3 History of Present Illness HPI Description: 06/28/20 upon evaluation today patient appears to be doing unfortunately somewhat poorly in regard to wounds that she has over her lower extremities. She has a wound on the left distal second toe, left posterior heel, and right anterior lower leg. With that being said all in all none of the wounds appear to be too bad the one that hurts her the most is actually the wound on the posterior heel which actually is also one of the smallest. Nonetheless I think there is some callus hiding what is really going on in this area. Fortunately there does not appear to be any obvious evidence of infection which is great news. Patient does have a history of diabetes mellitus type 2, chronic venous insufficiency, hypertension, history of DVTs, and is on long-term anticoagulant therapy, Eliquis. 07/05/20 upon evaluation today patient appears to be doing well currently with regard to her wounds. I feel like she is making progress which is great news and overall there is no signs of active infection at this time. No fevers, chills, nausea, vomiting, or diarrhea. 4/29; patient has a only a small area remaining on the tip of her left second toe and a small area remaining on the left heel. The area on the right anterior lower leg is healed. They tell me that she had remote podiatric surgery on the toes of the left foot however they are very fixed in position and I wonder whether they are making it difficult to relieve  pressure in her foot wear. She is a type II diabetic with neuropathy as well. Electronic Signature(s) Signed: 07/19/2020 4:40:00 PM By: Linton Ham MD Entered By: Linton Ham on 07/19/2020 13:09:44 SAAYA, PROCELL ([id number]) -------------------------------------------------------------------------------- Physical Exam Details Patient Name: [name] Date of Service: 07/19/2020 10:45 AM Medical Record Number: [id number] Patient Account Number: [id number] Date of Birth/Sex: 08-04-1950 (70 y.o. F) Treating RN: Carlene Coria Primary Care Provider: [name], Tamala [name] Clinician: Jeanine Luz Referring Provider: [name], Olivia Mackie Treating Provider/Extender: Tito Dine in Treatment: 3 Constitutional Sitting or standing Blood Pressure is within target range for patient.. Pulse regular and within target range for patient.Marland Kitchen Respirations regular, non- labored and within target range.. Temperature is normal and within the target range for the patient.Marland Kitchen appears in no distress. Notes Wound exam; the area on the tip of the left second toe is almost healed. The left heel still is open but looks smaller. No debridement is required. o The area on the right anterior lower leg is healed Electronic Signature(s) Signed: 07/19/2020 4:40:00 PM By: Linton Ham MD Entered By: Linton Ham on 07/19/2020 13:10:39 EMIRETH, COCKERHAM ([id number]) -------------------------------------------------------------------------------- Physician Orders Details Patient Name: [name] Date of Service: 07/19/2020 10:45 AM Medical Record Number: [id number] Patient Account Number: [id number] Date of Birth/Sex: December 24, 1950 (70 y.o. F) Treating RN: Carlene Coria Primary Care Provider: [name], Tamala [name] Clinician: Jeanine Luz Referring Provider: [name], Olivia Mackie Treating Provider/Extender: Tito Dine in Treatment: 3 Verbal / Phone Orders:  No Diagnosis Coding Follow-up Appointments o Return Appointment in 1 week. [facility] for wound care. May utilize  formulary equivalent dressing for wound treatment orders unless otherwise specified. Home Health Nurse may visit PRN to address patientos wound care needs. [facility] Bathing/ Shower/ Hygiene o May shower; gently cleanse wound with antibacterial soap, rinse and pat dry prior to dressing wounds Edema Control - Lymphedema / Segmental Compressive Device / Other o Elevate, Exercise Daily and Avoid Standing for Long Periods of Time. o Elevate legs to the level of the heart and pump ankles as often as possible o Elevate leg(s) parallel to the floor when sitting. Wound Treatment Wound #6 - Toe Second Wound Laterality: Left, Distal Cleanser: Normal Saline (Generic) 3 x Per Week/30 Days Discharge Instructions: Wash your hands with soap and water. Remove old dressing, discard into plastic bag and place into trash. Cleanse the wound with Normal Saline prior to applying a clean dressing using gauze sponges, not tissues or cotton balls. Do not scrub or use excessive force. Pat dry using gauze sponges, not tissue or cotton balls. Primary Dressing: Prisma 4.34 (in) (Generic) 3 x Per Week/30 Days Discharge Instructions: Moisten w/normal saline or sterile water; Cover wound as directed. Do not remove from wound bed. Secondary Dressing: Coverlet Latex-Free Fabric Adhesive Dressings (Generic) 3 x Per Week/30 Days Discharge Instructions: 1.5 x 2 Wound #7 - Calcaneus Wound Laterality: Left, Posterior Cleanser: Normal Saline (Generic) 3 x Per Week/30 Days Discharge Instructions: Wash your hands with soap and water. Remove old dressing, discard into plastic bag and place into trash. Cleanse the wound with Normal Saline prior to applying a clean dressing using gauze sponges, not tissues or cotton balls. Do not scrub or use excessive force. Pat dry using gauze  sponges, not tissue or cotton balls. Primary Dressing: Prisma 4.34 (in) (Generic) 3 x Per Week/30 Days Discharge Instructions: Moisten w/normal saline or sterile water; Cover wound as directed. Do not remove from wound bed. Secondary Dressing: Mepilex Border Flex, 4x4 (in/in) (Generic) 3 x Per Week/30 Days Discharge Instructions: Apply to wound as directed. Do not cut. Electronic Signature(s) Signed: 07/19/2020 4:40:00 PM By: Baltazar Najjar MD Signed: 07/19/2020 5:33:58 PM By: Yevonne Pax RN Entered By: Yevonne Pax on 07/19/2020 12:08:14 [name], [name] ([id number]) -------------------------------------------------------------------------------- Problem List Details Patient Name: [name] Date of Service: 07/19/2020 10:45 AM Medical Record Number: [id number] Patient Account Number: [id number] Date of Birth/Sex: [birth year] (70 y.o. F) Treating RN: Yevonne Pax Primary Care Provider: [name], Tereso Newcomer Clinician: Lolita Cram Referring Provider: [name], French Ana Treating Provider/Extender: Altamese [place] in Treatment: 3 Active Problems ICD-10 Encounter Code Description Active Date MDM Diagnosis E11.621 Type 2 diabetes mellitus with foot ulcer 06/28/2020 No Yes I87.2 Venous insufficiency (chronic) (peripheral) 06/28/2020 No Yes L97.522 Non-pressure chronic ulcer of other part of left foot with fat layer 06/28/2020 No Yes exposed L97.812 Non-pressure chronic ulcer of other part of right lower leg with fat layer 06/28/2020 No Yes exposed L97.322 Non-pressure chronic ulcer of left ankle with fat layer exposed 06/28/2020 No Yes I10 Essential (primary) hypertension 06/28/2020 No Yes Z86.718 Personal history of other venous thrombosis and embolism 06/28/2020 No Yes Z79.01 Long term (current) use of anticoagulants 06/28/2020 No Yes Inactive Problems Resolved Problems Electronic Signature(s) Signed: 07/19/2020 4:40:00 PM By: Baltazar Najjar MD Entered By: Baltazar Najjar on 07/19/2020 13:07:18 Debarr, Gala Romney ([id number]) -------------------------------------------------------------------------------- Progress Note Details Patient Name: [name] Date of Service: 07/19/2020 10:45 AM Medical Record Number: [id number] Patient Account Number: [id number] Date of Birth/Sex: [birth year] (70 y.o. F) Treating RN: Yevonne Pax Primary Care Provider: [name], Tereso Newcomer  Clinician: Jeanine Luz Referring Provider: [name], Olivia Mackie Treating Provider/Extender: Tito Dine in Treatment: 3 Subjective History of Present Illness (HPI) 06/28/20 upon evaluation today patient appears to be doing unfortunately somewhat poorly in regard to wounds that she has over her lower extremities. She has a wound on the left distal second toe, left posterior heel, and right anterior lower leg. With that being said all in all none of the wounds appear to be too bad the one that hurts her the most is actually the wound on the posterior heel which actually is also one of the smallest. Nonetheless I think there is some callus hiding what is really going on in this area. Fortunately there does not appear to be any obvious evidence of infection which is great news. Patient does have a history of diabetes mellitus type 2, chronic venous insufficiency, hypertension, history of DVTs, and is on long-term anticoagulant therapy, Eliquis. 07/05/20 upon evaluation today patient appears to be doing well currently with regard to her wounds. I feel like she is making progress which is great news and overall there is no signs of active infection at this time. No fevers, chills, nausea, vomiting, or diarrhea. 4/29; patient has a only a small area remaining on the tip of her left second toe and a small area remaining on the left heel. The area on the right anterior lower leg is healed. They tell me that she had remote podiatric surgery on the toes of the left foot  however they are very fixed in position and I wonder whether they are making it difficult to relieve pressure in her foot wear. She is a type II diabetic with neuropathy as well. Objective Constitutional Sitting or standing Blood Pressure is within target range for patient.. Pulse regular and within target range for patient.Marland Kitchen Respirations regular, non- labored and within target range.. Temperature is normal and within the target range for the patient.Marland Kitchen appears in no distress. Vitals Time Taken: 11:18 AM, Height: 65.5 in, Weight: 240 lbs, BMI: 39.3, Temperature: 98.1 F, Pulse: 59 bpm, Respiratory Rate: 18 breaths/min, Blood Pressure: 106/62 mmHg. General Notes: Wound exam; the area on the tip of the left second toe is almost healed. The left heel still is open but looks smaller. No debridement is required. The area on the right anterior lower leg is healed Integumentary (Hair, Skin) Wound #6 status is Open. Original cause of wound was Pressure Injury. The date acquired was: 01/05/2020. The wound has been in treatment 3 weeks. The wound is located on the Left,Distal Toe Second. The wound measures 0.3cm length x 0.2cm width x 0.14cm depth; 0.047cm^2 area and 0.007cm^3 volume. There is Fat Layer (Subcutaneous Tissue) exposed. There is no tunneling or undermining noted. There is a none present amount of drainage noted. There is small (1-33%) pink granulation within the wound bed. There is a small (1-33%) amount of necrotic tissue within the wound bed including Eschar. Wound #7 status is Open. Original cause of wound was Gradually Appeared. The date acquired was: 03/23/2020. The wound has been in treatment 3 weeks. The wound is located on the Left,Posterior Calcaneus. The wound measures 0.8cm length x 0.6cm width x 0.1cm depth; 0.377cm^2 area and 0.038cm^3 volume. There is Fat Layer (Subcutaneous Tissue) exposed. There is no tunneling or undermining noted. There is a small amount of serous drainage  noted. The wound margin is flat and intact. There is small (1-33%) pink, pale granulation within the wound bed. There is a large (67-100%) amount of  necrotic tissue within the wound bed. Wound #8 status is Open. Original cause of wound was Shear/Friction. The date acquired was: 06/03/2020. The wound has been in treatment 3 weeks. The wound is located on the Right,Anterior Lower Leg. The wound measures 0cm length x 0cm width x 0cm depth; 0cm^2 area and 0cm^3 volume. There is no tunneling or undermining noted. There is a none present amount of drainage noted. There is no granulation within the wound bed. There is no necrotic tissue within the wound bed. Assessment Active Problems ICD-10 Type 2 diabetes mellitus with foot ulcer Moskowitz, Aralynn B. ([id number]) Venous insufficiency (chronic) (peripheral) Non-pressure chronic ulcer of other part of left foot with fat layer exposed Non-pressure chronic ulcer of other part of right lower leg with fat layer exposed Non-pressure chronic ulcer of left ankle with fat layer exposed Essential (primary) hypertension Personal history of other venous thrombosis and embolism Long term (current) use of anticoagulants Plan Follow-up Appointments: Return Appointment in 1 week. Home Health: [facility] for wound care. May utilize formulary equivalent dressing for wound treatment orders unless otherwise specified. Home Health Nurse may visit PRN to address patient s wound care needs. [facility] Bathing/ Shower/ Hygiene: May shower; gently cleanse wound with antibacterial soap, rinse and pat dry prior to dressing wounds Edema Control - Lymphedema / Segmental Compressive Device / Other: Elevate, Exercise Daily and Avoid Standing for Long Periods of Time. Elevate legs to the level of the heart and pump ankles as often as possible Elevate leg(s) parallel to the floor when sitting. WOUND #6: - Toe Second Wound Laterality: Left, Distal Cleanser: Normal  Saline (Generic) 3 x Per Week/30 Days Discharge Instructions: Wash your hands with soap and water. Remove old dressing, discard into plastic bag and place into trash. Cleanse the wound with Normal Saline prior to applying a clean dressing using gauze sponges, not tissues or cotton balls. Do not scrub or use excessive force. Pat dry using gauze sponges, not tissue or cotton balls. Primary Dressing: Prisma 4.34 (in) (Generic) 3 x Per Week/30 Days Discharge Instructions: Moisten w/normal saline or sterile water; Cover wound as directed. Do not remove from wound bed. Secondary Dressing: Coverlet Latex-Free Fabric Adhesive Dressings (Generic) 3 x Per Week/30 Days Discharge Instructions: 1.5 x 2 WOUND #7: - Calcaneus Wound Laterality: Left, Posterior Cleanser: Normal Saline (Generic) 3 x Per Week/30 Days Discharge Instructions: Wash your hands with soap and water. Remove old dressing, discard into plastic bag and place into trash. Cleanse the wound with Normal Saline prior to applying a clean dressing using gauze sponges, not tissues or cotton balls. Do not scrub or use excessive force. Pat dry using gauze sponges, not tissue or cotton balls. Primary Dressing: Prisma 4.34 (in) (Generic) 3 x Per Week/30 Days Discharge Instructions: Moisten w/normal saline or sterile water; Cover wound as directed. Do not remove from wound bed. Secondary Dressing: Mepilex Border Flex, 4x4 (in/in) (Generic) 3 x Per Week/30 Days Discharge Instructions: Apply to wound as directed. Do not cut. 1. We continued with Prisma 2. Mepilex border 3. I asked the patient to transition into some form of sandal so that nothing would put pressure or friction on the wound areas now or going forward. Electronic Signature(s) Signed: 07/19/2020 4:40:00 PM By: Linton Ham MD Entered By: Linton Ham on 07/19/2020 13:11:29 [name], [name]  ([id number]) -------------------------------------------------------------------------------- SuperBill Details Patient Name: [name] Date of Service: 07/19/2020 Medical Record Number: [id number] Patient Account Number: [id number] Date of Birth/Sex: [birth year] (69 y.o.  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 I10 Essential (primary) hypertension Z86.718 Personal history of other venous thrombosis and embolism Z79.01 Long term (current) use of anticoagulants Facility Procedures CPT4 Code: [id number] Description: 99213 - WOUND CARE VISIT-LEV 3 EST PT Modifier: Quantity: 1 Physician Procedures CPT4 Code: [id number] Description: 99213 - WC PHYS LEVEL 3 - EST PT Modifier: Quantity: 1 CPT4 Code: Description: ICD-10 Diagnosis Description L97.522 Non-pressure chronic ulcer of other part of left foot with fat layer ex L97.322 Non-pressure chronic ulcer of left ankle with fat layer exposed L97.812 Non-pressure chronic ulcer of other part of right  lower leg with fat la Modifier: posed yer exposed Quantity: Electronic Signature(s) Signed: 07/19/2020 4:40:00 PM By: Linton Ham MD Entered By: Linton Ham on 07/19/2020 13:11:54

## 2020-07-22 DIAGNOSIS — M19012 Primary osteoarthritis, left shoulder: Secondary | ICD-10-CM | POA: Diagnosis not present

## 2020-07-22 DIAGNOSIS — G8929 Other chronic pain: Secondary | ICD-10-CM | POA: Diagnosis not present

## 2020-07-22 DIAGNOSIS — M25512 Pain in left shoulder: Secondary | ICD-10-CM | POA: Diagnosis not present

## 2020-07-23 ENCOUNTER — Ambulatory Visit (INDEPENDENT_AMBULATORY_CARE_PROVIDER_SITE_OTHER): Payer: Medicare HMO | Admitting: Nurse Practitioner

## 2020-07-23 ENCOUNTER — Other Ambulatory Visit: Payer: Self-pay | Admitting: Internal Medicine

## 2020-07-23 DIAGNOSIS — Z1231 Encounter for screening mammogram for malignant neoplasm of breast: Secondary | ICD-10-CM

## 2020-07-25 DIAGNOSIS — Z7984 Long term (current) use of oral hypoglycemic drugs: Secondary | ICD-10-CM | POA: Diagnosis not present

## 2020-07-25 DIAGNOSIS — Z6841 Body Mass Index (BMI) 40.0 and over, adult: Secondary | ICD-10-CM | POA: Diagnosis not present

## 2020-07-25 DIAGNOSIS — I872 Venous insufficiency (chronic) (peripheral): Secondary | ICD-10-CM | POA: Diagnosis not present

## 2020-07-25 DIAGNOSIS — I4819 Other persistent atrial fibrillation: Secondary | ICD-10-CM | POA: Diagnosis not present

## 2020-07-25 DIAGNOSIS — E875 Hyperkalemia: Secondary | ICD-10-CM | POA: Diagnosis not present

## 2020-07-25 DIAGNOSIS — E1151 Type 2 diabetes mellitus with diabetic peripheral angiopathy without gangrene: Secondary | ICD-10-CM | POA: Diagnosis not present

## 2020-07-25 DIAGNOSIS — I272 Pulmonary hypertension, unspecified: Secondary | ICD-10-CM | POA: Diagnosis not present

## 2020-07-25 DIAGNOSIS — I152 Hypertension secondary to endocrine disorders: Secondary | ICD-10-CM | POA: Diagnosis not present

## 2020-07-25 DIAGNOSIS — E1159 Type 2 diabetes mellitus with other circulatory complications: Secondary | ICD-10-CM | POA: Diagnosis not present

## 2020-07-25 DIAGNOSIS — I69319 Unspecified symptoms and signs involving cognitive functions following cerebral infarction: Secondary | ICD-10-CM | POA: Diagnosis not present

## 2020-07-25 DIAGNOSIS — L03116 Cellulitis of left lower limb: Secondary | ICD-10-CM | POA: Diagnosis not present

## 2020-07-25 DIAGNOSIS — J449 Chronic obstructive pulmonary disease, unspecified: Secondary | ICD-10-CM | POA: Diagnosis not present

## 2020-07-25 DIAGNOSIS — J9601 Acute respiratory failure with hypoxia: Secondary | ICD-10-CM | POA: Diagnosis not present

## 2020-07-25 DIAGNOSIS — L97422 Non-pressure chronic ulcer of left heel and midfoot with fat layer exposed: Secondary | ICD-10-CM | POA: Diagnosis not present

## 2020-07-25 DIAGNOSIS — L97812 Non-pressure chronic ulcer of other part of right lower leg with fat layer exposed: Secondary | ICD-10-CM | POA: Diagnosis not present

## 2020-07-25 DIAGNOSIS — I69320 Aphasia following cerebral infarction: Secondary | ICD-10-CM | POA: Diagnosis not present

## 2020-07-25 DIAGNOSIS — F339 Major depressive disorder, recurrent, unspecified: Secondary | ICD-10-CM | POA: Diagnosis not present

## 2020-07-25 DIAGNOSIS — I890 Lymphedema, not elsewhere classified: Secondary | ICD-10-CM | POA: Diagnosis not present

## 2020-07-25 DIAGNOSIS — I081 Rheumatic disorders of both mitral and tricuspid valves: Secondary | ICD-10-CM | POA: Diagnosis not present

## 2020-07-25 DIAGNOSIS — E1142 Type 2 diabetes mellitus with diabetic polyneuropathy: Secondary | ICD-10-CM | POA: Diagnosis not present

## 2020-07-25 DIAGNOSIS — A419 Sepsis, unspecified organism: Secondary | ICD-10-CM | POA: Diagnosis not present

## 2020-07-25 DIAGNOSIS — L97522 Non-pressure chronic ulcer of other part of left foot with fat layer exposed: Secondary | ICD-10-CM | POA: Diagnosis not present

## 2020-07-25 DIAGNOSIS — G319 Degenerative disease of nervous system, unspecified: Secondary | ICD-10-CM | POA: Diagnosis not present

## 2020-07-25 DIAGNOSIS — F419 Anxiety disorder, unspecified: Secondary | ICD-10-CM | POA: Diagnosis not present

## 2020-07-25 DIAGNOSIS — M86572 Other chronic hematogenous osteomyelitis, left ankle and foot: Secondary | ICD-10-CM | POA: Diagnosis not present

## 2020-07-25 DIAGNOSIS — E1152 Type 2 diabetes mellitus with diabetic peripheral angiopathy with gangrene: Secondary | ICD-10-CM | POA: Diagnosis not present

## 2020-07-25 DIAGNOSIS — I5033 Acute on chronic diastolic (congestive) heart failure: Secondary | ICD-10-CM | POA: Diagnosis not present

## 2020-07-25 DIAGNOSIS — G8194 Hemiplegia, unspecified affecting left nondominant side: Secondary | ICD-10-CM | POA: Diagnosis not present

## 2020-07-25 DIAGNOSIS — M159 Polyosteoarthritis, unspecified: Secondary | ICD-10-CM | POA: Diagnosis not present

## 2020-07-25 DIAGNOSIS — E11621 Type 2 diabetes mellitus with foot ulcer: Secondary | ICD-10-CM | POA: Diagnosis not present

## 2020-07-25 DIAGNOSIS — N390 Urinary tract infection, site not specified: Secondary | ICD-10-CM | POA: Diagnosis not present

## 2020-07-25 DIAGNOSIS — L03115 Cellulitis of right lower limb: Secondary | ICD-10-CM | POA: Diagnosis not present

## 2020-07-29 ENCOUNTER — Ambulatory Visit: Payer: Medicare HMO | Admitting: Physician Assistant

## 2020-07-31 ENCOUNTER — Ambulatory Visit (INDEPENDENT_AMBULATORY_CARE_PROVIDER_SITE_OTHER): Payer: Medicare HMO | Admitting: Nurse Practitioner

## 2020-07-31 ENCOUNTER — Other Ambulatory Visit: Payer: Self-pay

## 2020-07-31 VITALS — BP 90/60 | HR 69 | Resp 16

## 2020-07-31 DIAGNOSIS — I739 Peripheral vascular disease, unspecified: Secondary | ICD-10-CM | POA: Diagnosis not present

## 2020-07-31 DIAGNOSIS — I10 Essential (primary) hypertension: Secondary | ICD-10-CM

## 2020-07-31 DIAGNOSIS — L97409 Non-pressure chronic ulcer of unspecified heel and midfoot with unspecified severity: Secondary | ICD-10-CM

## 2020-08-01 ENCOUNTER — Encounter: Payer: Medicare HMO | Attending: Physician Assistant | Admitting: Physician Assistant

## 2020-08-01 DIAGNOSIS — Z86718 Personal history of other venous thrombosis and embolism: Secondary | ICD-10-CM | POA: Insufficient documentation

## 2020-08-01 DIAGNOSIS — Z7901 Long term (current) use of anticoagulants: Secondary | ICD-10-CM | POA: Diagnosis not present

## 2020-08-01 DIAGNOSIS — L97422 Non-pressure chronic ulcer of left heel and midfoot with fat layer exposed: Secondary | ICD-10-CM | POA: Diagnosis not present

## 2020-08-01 DIAGNOSIS — E1151 Type 2 diabetes mellitus with diabetic peripheral angiopathy without gangrene: Secondary | ICD-10-CM | POA: Insufficient documentation

## 2020-08-01 DIAGNOSIS — E1140 Type 2 diabetes mellitus with diabetic neuropathy, unspecified: Secondary | ICD-10-CM | POA: Insufficient documentation

## 2020-08-01 DIAGNOSIS — L97812 Non-pressure chronic ulcer of other part of right lower leg with fat layer exposed: Secondary | ICD-10-CM | POA: Diagnosis not present

## 2020-08-01 DIAGNOSIS — I10 Essential (primary) hypertension: Secondary | ICD-10-CM | POA: Insufficient documentation

## 2020-08-01 DIAGNOSIS — E11621 Type 2 diabetes mellitus with foot ulcer: Secondary | ICD-10-CM | POA: Insufficient documentation

## 2020-08-01 DIAGNOSIS — L97522 Non-pressure chronic ulcer of other part of left foot with fat layer exposed: Secondary | ICD-10-CM | POA: Diagnosis not present

## 2020-08-01 DIAGNOSIS — L97322 Non-pressure chronic ulcer of left ankle with fat layer exposed: Secondary | ICD-10-CM | POA: Insufficient documentation

## 2020-08-01 DIAGNOSIS — I872 Venous insufficiency (chronic) (peripheral): Secondary | ICD-10-CM | POA: Insufficient documentation

## 2020-08-01 NOTE — Progress Notes (Addendum)
[name], [name] ([id number]) Visit Report for 08/01/2020 Chief Complaint Document Details Patient Name: [name], [name] Date of Service: 08/01/2020 10:45 AM Medical Record Number: [id number] Patient Account Number: [id number] Date of Birth/Sex: [birth year] (70 y.o. F) Treating RN: Dolan Amen Primary Care Provider: [name], Olivia Mackie Other Clinician: Referring Provider: [name], Olivia Mackie Treating Provider/Extender: Skipper Cliche in Treatment: 4 Information Obtained from: Patient Chief Complaint B/L lower extremity ulcers Electronic Signature(s) Signed: 08/01/2020 11:03:01 AM By: Worthy Keeler PA-C Entered By: Worthy Keeler on 08/01/2020 11:03:01 [name], [name] ([id number]) -------------------------------------------------------------------------------- Debridement Details Patient Name: [name] Date of Service: 08/01/2020 10:45 AM Medical Record Number: [id number] Patient Account Number: [id number] Date of Birth/Sex: March 31, 1950 (69 y.o. F) Treating RN: Cornell Barman Primary Care Provider: [name], Olivia Mackie Other Clinician: Referring Provider: [name], Olivia Mackie Treating Provider/Extender: Skipper Cliche in Treatment: 4 Debridement Performed for Wound #10 Left Calcaneus Assessment: Performed By: Physician Tommie Sams., PA-C Debridement Type: Debridement Severity of Tissue Pre Debridement: Fat layer exposed Level of Consciousness (Pre- Awake and Alert procedure): Pre-procedure Verification/Time Out Yes - 11:14 Taken: Total Area Debrided (L x W): 0.2 (cm) x 0.2 (cm) = 0.04 (cm) Tissue and other material Viable, Non-Viable, Slough, Subcutaneous, Slough debrided: Level: Skin/Subcutaneous Tissue Debridement Description: Excisional Instrument: Curette Bleeding: Minimum Hemostasis Achieved: Pressure Response to Treatment: Procedure was tolerated well Level of Consciousness (Post- Awake and Alert procedure): Post Debridement  Measurements of Total Wound Length: (cm) 0.2 Width: (cm) 0.2 Depth: (cm) 0.3 Volume: (cm) 0.009 Character of Wound/Ulcer Post Debridement: Stable Severity of Tissue Post Debridement: Fat layer exposed Post Procedure Diagnosis Same as Pre-procedure Electronic Signature(s) Signed: 08/01/2020 5:02:32 PM By: Gretta Cool, BSN, RN, CWS, Kim RN, BSN Signed: 08/01/2020 8:15:19 PM By: Worthy Keeler PA-C Entered By: Gretta Cool, BSN, RN, CWS, Kim on 08/01/2020 11:15:07 KABREA, SEENEY ([id number]) -------------------------------------------------------------------------------- HPI Details Patient Name: [name] Date of Service: 08/01/2020 10:45 AM Medical Record Number: [id number] Patient Account Number: [id number] Date of Birth/Sex: 13-Sep-1950 (70 y.o. F) Treating RN: Dolan Amen Primary Care Provider: [name], Olivia Mackie Other Clinician: Referring Provider: [name], Olivia Mackie Treating Provider/Extender: Skipper Cliche in Treatment: 4 History of Present Illness HPI Description: 06/28/20 upon evaluation today patient appears to be doing unfortunately somewhat poorly in regard to wounds that she has over her lower extremities. She has a wound on the left distal second toe, left posterior heel, and right anterior lower leg. With that being said all in all none of the wounds appear to be too bad the one that hurts her the most is actually the wound on the posterior heel which actually is also one of the smallest. Nonetheless I think there is some callus hiding what is really going on in this area. Fortunately there does not appear to be any obvious evidence of infection which is great news. Patient does have a history of diabetes mellitus type 2, chronic venous insufficiency, hypertension, history of DVTs, and is on long-term anticoagulant therapy, Eliquis. 07/05/20 upon evaluation today patient appears to be doing well currently with regard to her wounds. I feel like she is making progress  which is great news and overall there is no signs of active infection at this time. No fevers, chills, nausea, vomiting, or diarrhea. 4/29; patient has a only a small area remaining on the tip of her left second toe and a small area remaining on the left heel. The area on the right anterior lower leg is healed. They tell me that she had remote podiatric  surgery on the toes of the left foot however they are very fixed in position and I wonder whether they are making it difficult to relieve pressure in her foot wear. She is a type II diabetic with neuropathy as well. 08/01/2020 upon evaluation today patient appears to be doing well currently in regard to her wounds. In fact everything appears to be doing much better. The posterior aspect of her heel is actually giving her some trouble not the Achilles area but a small wound that we did not even have marked. In fact I had noted this before. Nonetheless we are going to addressed that today. Electronic Signature(s) Signed: 08/01/2020 7:23:53 PM By: Worthy Keeler PA-C Entered By: Worthy Keeler on 08/01/2020 19:23:53 DYNEISHA, CULLIVER ([id number]) -------------------------------------------------------------------------------- Physical Exam Details Patient Name: [name] Date of Service: 08/01/2020 10:45 AM Medical Record Number: [id number] Patient Account Number: [id number] Date of Birth/Sex: [birth year] (70 y.o. F) Treating RN: Dolan Amen Primary Care Provider: [name], Olivia Mackie Other Clinician: Referring Provider: [name], Olivia Mackie Treating Provider/Extender: Skipper Cliche in Treatment: 4 Constitutional Well-nourished and well-hydrated in no acute distress. Respiratory normal breathing without difficulty. Psychiatric this patient is able to make decisions and demonstrates good insight into disease process. Alert and Oriented x 3. pleasant and cooperative. Notes Upon inspection patient's wound bed actually showed  signs of good granulation epithelization at this point. There does not appear to be any signs of infection which is great news and overall very pleased with where things stand. I am very hopeful that we will continue to see good improvement with regard to her wounds. The patient is very pleased for the most part other than her heel which is hurting her. Electronic Signature(s) Signed: 08/01/2020 7:24:17 PM By: Worthy Keeler PA-C Entered By: Worthy Keeler on 08/01/2020 19:24:17 BREEANNE, CATALANOTTO ([id number]) -------------------------------------------------------------------------------- Physician Orders Details Patient Name: [name] Date of Service: 08/01/2020 10:45 AM Medical Record Number: [id number] Patient Account Number: [id number] Date of Birth/Sex: 1950-05-18 (69 y.o. 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 [facility] for wound care. May utilize formulary equivalent dressing for wound treatment orders unless otherwise specified. Home Health Nurse may visit PRN to address patientos wound care needs. [facility] Bathing/ Shower/ Hygiene o May shower; gently cleanse wound  with antibacterial soap, rinse and pat dry prior to dressing wounds Edema Control - Lymphedema / Segmental Compressive Device / Other o Elevate, Exercise Daily and Avoid Standing for Long Periods of Time. o Elevate legs to the level of the heart and pump ankles as often as possible o Elevate leg(s) parallel to the floor when sitting. Wound Treatment Wound #10 - Calcaneus Wound Laterality: Left Cleanser: Normal Saline (Generic) 3 x Per Week/30 Days Discharge Instructions: Wash your hands with soap and water. Remove old dressing, discard into plastic bag and place  into trash. Cleanse the wound with Normal Saline prior to applying a clean dressing using gauze sponges, not tissues or cotton balls. Do not scrub or use excessive force. Pat dry using gauze sponges, not tissue or cotton balls. Primary Dressing: Prisma 4.34 (in) (Generic) 3 x Per Week/30 Days Discharge Instructions: Moisten w/normal saline or sterile water; Cover wound as directed. Do not remove from wound bed. Secondary Dressing: Mepilex Border Flex, 4x4 (in/in) (Generic) 3 x Per Week/30 Days Discharge Instructions: Apply to wound as directed. Do not cut. Wound #6 - Toe Second Wound Laterality: Left, Distal Cleanser: Normal Saline (Generic) 3 x Per Week/30 Days Discharge Instructions: Wash your hands with soap and water. Remove old dressing, discard into plastic bag and place into trash. Cleanse the wound with Normal Saline prior to applying a clean dressing using gauze sponges, not tissues or cotton balls. Do not scrub or use excessive force. Pat dry using gauze sponges, not tissue or cotton balls. Primary Dressing: Prisma 4.34 (in) (Generic) 3 x Per Week/30 Days Discharge Instructions: Moisten w/normal saline or sterile water; Cover wound as directed. Do not remove from wound bed. Secondary Dressing: Coverlet Latex-Free Fabric Adhesive Dressings (Generic) 3 x Per Week/30 Days Discharge Instructions: 1.5 x 2 Wound #7 -  Achilles Wound Laterality: Left, Posterior Boquet, Ameirah B. ([id number]) Cleanser: Normal Saline (Generic) 3 x Per Week/30 Days Discharge Instructions: Wash your hands with soap and water. Remove old dressing, discard into plastic bag and place into trash. Cleanse the wound with Normal Saline prior to applying a clean dressing using gauze sponges, not tissues or cotton balls. Do not scrub or use excessive force. Pat dry using gauze sponges, not tissue or cotton balls. Primary Dressing: Prisma 4.34 (in) (Generic) 3 x Per Week/30 Days Discharge Instructions: Moisten w/normal saline or sterile water; Cover wound as directed. Do not remove from wound bed. Secondary Dressing: Mepilex Border Flex, 4x4 (in/in) (Generic) 3 x Per Week/30 Days Discharge Instructions: Apply to wound as directed. Do not cut. Wound #9 - Forearm Wound Laterality: Right, Lateral Cleanser: Normal Saline (Generic) 3 x Per Week/30 Days Discharge Instructions: Wash your hands with soap and water. Remove old dressing, discard into plastic bag and place into trash. Cleanse the wound with Normal Saline prior to applying a clean dressing using gauze sponges, not tissues or cotton balls. Do not scrub or use excessive force. Pat dry using gauze sponges, not tissue or cotton balls. Primary Dressing: Prisma 4.34 (in) (Generic) 3 x Per Week/30 Days Discharge Instructions: Moisten w/normal saline or sterile water; Cover wound as directed. Do not remove from wound bed. Add-Ons: Coverlet Latex-Free Fabric Adhesive Dressings 3 x Per Week/30 Days Discharge Instructions: Elastic Knuckle Bandage Electronic Signature(s) Signed: 08/01/2020 5:02:32 PM By: Gretta Cool, BSN, RN, CWS, Kim RN, BSN Signed: 08/01/2020 8:15:19 PM By: Worthy Keeler PA-C Entered By: Gretta Cool, BSN, RN, CWS, Kim on 08/01/2020 11:19:12 LASHANA, SPANG ([id number]) -------------------------------------------------------------------------------- Problem List Details Patient Name:  [name] Date of Service: 08/01/2020 10:45 AM Medical Record Number: [id number] Patient Account Number: [id number] Date of Birth/Sex: July 07, 1950 (70 y.o. F) Treating RN: Dolan Amen Primary Care Provider: [name], Olivia Mackie Other Clinician: Referring Provider: [name], Olivia Mackie Treating Provider/Extender: Skipper Cliche in Treatment: 4 Active Problems ICD-10 Encounter Code Description Active Date MDM Diagnosis E11.621 Type 2 diabetes mellitus with foot ulcer 06/28/2020 No Yes I87.2 Venous insufficiency (chronic) (peripheral) 06/28/2020 No Yes L97.522 Non-pressure chronic ulcer of other part of left foot with fat layer 06/28/2020 No Yes exposed  [id number] Non-pressure chronic ulcer of other part of right lower leg with fat layer 06/28/2020 No Yes exposed L97.322 Non-pressure chronic ulcer of left ankle with fat layer exposed 06/28/2020 No Yes I10 Essential (primary) hypertension 06/28/2020 No Yes Z86.718 Personal history of other venous thrombosis and embolism 06/28/2020 No Yes Z79.01 Long term (current) use of anticoagulants 06/28/2020 No Yes Inactive Problems Resolved Problems Electronic Signature(s) Signed: 08/01/2020 11:02:55 AM By: Worthy Keeler PA-C Entered By: Worthy Keeler on 08/01/2020 11:02:55 [name], [name] ([id number]) -------------------------------------------------------------------------------- Progress Note Details Patient Name: [name] Date of Service: 08/01/2020 10:45 AM Medical Record Number: [id number] Patient Account Number: [id number] Date of Birth/Sex: 1950/08/28 (69 y.o. F) Treating RN: Dolan Amen Primary Care Provider: [name], Olivia Mackie Other Clinician: Referring Provider: [name], Olivia Mackie Treating Provider/Extender: Skipper Cliche in Treatment: 4 Subjective Chief Complaint Information obtained from Patient B/L lower extremity ulcers History of Present Illness (HPI) 06/28/20 upon evaluation today patient appears to  be doing unfortunately somewhat poorly in regard to wounds that she has over her lower extremities. She has a wound on the left distal second toe, left posterior heel, and right anterior lower leg. With that being said all in all none of the wounds appear to be too bad the one that hurts her the most is actually the wound on the posterior heel which actually is also one of the smallest. Nonetheless I think there is some callus hiding what is really going on in this area. Fortunately there does not appear to be any obvious evidence of infection which is great news. Patient does have a history of diabetes mellitus type 2, chronic venous insufficiency, hypertension, history of DVTs, and is on long-term anticoagulant therapy, Eliquis. 07/05/20 upon evaluation today patient appears to be doing well currently with regard to her wounds. I feel like she is making progress which is great news and overall there is no signs of active infection at this time. No fevers, chills, nausea, vomiting, or diarrhea. 4/29; patient has a only a small area remaining on the tip of her left second toe and a small area remaining on the left heel. The area on the right anterior lower leg is healed. They tell me that she had remote podiatric surgery on the toes of the left foot however they are very fixed in position and I wonder whether they are making it difficult to relieve pressure in her foot wear. She is a type II diabetic with neuropathy as well. 08/01/2020 upon evaluation today patient appears to be doing well currently in regard to her wounds. In fact everything appears to be doing much better. The posterior aspect of her heel is actually giving her some trouble not the Achilles area but a small wound that we did not even have marked. In fact I had noted this before. Nonetheless we are going to addressed that today. Objective Constitutional Well-nourished and well-hydrated in no acute distress. Vitals Time Taken: 10:43  AM, Height: 65.5 in, Weight: 240 lbs, BMI: 39.3, Temperature: 97.6 F, Pulse: 69 bpm, Respiratory Rate: 20 breaths/min, Blood Pressure: 86/54 mmHg. Respiratory normal breathing without difficulty. Psychiatric this patient is able to make decisions and demonstrates good insight into disease process. Alert and Oriented x 3. pleasant and cooperative. General Notes: Upon inspection patient's wound bed actually showed signs of good granulation epithelization at this point. There does not appear to be any signs of infection which is great news and overall very pleased with where things stand. I am very hopeful  that we will continue to see good improvement with regard to her wounds. The patient is very pleased for the most part other than her heel which is hurting her. Integumentary (Hair, Skin) Wound #10 status is Open. Original cause of wound was Gradually Appeared. The date acquired was: 07/25/2020. The wound is located on the Left Calcaneus. The wound measures 0.2cm length x 0.2cm width x 0.2cm depth; 0.031cm^2 area and 0.006cm^3 volume. There is Fat Layer (Subcutaneous Tissue) exposed. There is no tunneling or undermining noted. There is a medium amount of serous drainage noted. The wound margin is flat and intact. There is no granulation within the wound bed. There is a large (67-100%) amount of necrotic tissue within the wound bed including Adherent Slough. Wound #6 status is Open. Original cause of wound was Pressure Injury. The date acquired was: 01/05/2020. The wound has been in treatment 4 weeks. The wound is located on the Left,Distal Toe Second. The wound measures 0.1cm length x 0.1cm width x 0.1cm depth; 0.008cm^2 area and 0.001cm^3 volume. There is no tunneling or undermining noted. There is a none present amount of drainage noted. There is no granulation within the wound bed. There is no necrotic tissue within the wound bed. Wound #7 status is Open. Original cause of wound was Gradually  Appeared. The date acquired was: 03/23/2020. The wound has been in treatment Cirrincione, Zapata ([id number]) 4 weeks. The wound is located on the Left,Posterior Achilles. The wound measures 0.5cm length x 0.3cm width x 0.1cm depth; 0.118cm^2 area and 0.012cm^3 volume. There is Fat Layer (Subcutaneous Tissue) exposed. There is no tunneling or undermining noted. There is a small amount of serous drainage noted. The wound margin is flat and intact. There is large (67-100%) pink, pale granulation within the wound bed. There is a small (1-33%) amount of necrotic tissue within the wound bed including Adherent Slough. Wound #9 status is Open. Original cause of wound was Skin Tear/Laceration. The date acquired was: 07/29/2020. The wound is located on the Right,Lateral Forearm. The wound measures 0.3cm length x 0.2cm width x 0.1cm depth; 0.047cm^2 area and 0.005cm^3 volume. There is Fat Layer (Subcutaneous Tissue) exposed. There is no tunneling or undermining noted. There is a small amount of serosanguineous drainage noted. There is medium (34-66%) granulation within the wound bed. There is a medium (34-66%) amount of necrotic tissue within the wound bed including Eschar. Assessment Active Problems ICD-10 Type 2 diabetes mellitus with foot ulcer Venous insufficiency (chronic) (peripheral) Non-pressure chronic ulcer of other part of left foot with fat layer exposed Non-pressure chronic ulcer of other part of right lower leg with fat layer exposed Non-pressure chronic ulcer of left ankle with fat layer exposed Essential (primary) hypertension Personal history of other venous thrombosis and embolism Long term (current) use of anticoagulants Procedures Wound #10 Pre-procedure diagnosis of Wound #10 is a Diabetic Wound/Ulcer of the Lower Extremity located on the Left Calcaneus .Severity of Tissue Pre Debridement is: Fat layer exposed. There was a Excisional Skin/Subcutaneous Tissue Debridement with a total area  of 0.04 sq cm performed by Tommie Sams., PA-C. With the following instrument(s): Curette to remove Viable and Non-Viable tissue/material. Material removed includes Subcutaneous Tissue and Slough and. No specimens were taken. A time out was conducted at 11:14, prior to the start of the procedure. A Minimum amount of bleeding was controlled with Pressure. The procedure was tolerated well. Post Debridement Measurements: 0.2cm length x 0.2cm width x 0.3cm depth; 0.009cm^3 volume. Character of Wound/Ulcer Post Debridement  is stable. Severity of Tissue Post Debridement is: Fat layer exposed. Post procedure Diagnosis Wound #10: Same as Pre-Procedure Plan Follow-up Appointments: Return Appointment in 1 week. Home Health: [facility] for wound care. May utilize formulary equivalent dressing for wound treatment orders unless otherwise specified. Home Health Nurse may visit PRN to address patient s wound care needs. [facility] Bathing/ Shower/ Hygiene: May shower; gently cleanse wound with antibacterial soap, rinse and pat dry prior to dressing wounds Edema Control - Lymphedema / Segmental Compressive Device / Other: Elevate, Exercise Daily and Avoid Standing for Long Periods of Time. Elevate legs to the level of the heart and pump ankles as often as possible Elevate leg(s) parallel to the floor when sitting. WOUND #10: - Calcaneus Wound Laterality: Left Cleanser: Normal Saline (Generic) 3 x Per Week/30 Days Discharge Instructions: Wash your hands with soap and water. Remove old dressing, discard into plastic bag and place into trash. Cleanse the wound with Normal Saline prior to applying a clean dressing using gauze sponges, not tissues or cotton balls. Do not scrub or use excessive force. Pat dry using gauze sponges, not tissue or cotton balls. Primary Dressing: Prisma 4.34 (in) (Generic) 3 x Per Week/30 Days Discharge Instructions: Moisten w/normal saline or sterile water; Cover wound as  directed. Do not remove from wound bed. Secondary Dressing: Mepilex Border Flex, 4x4 (in/in) (Generic) 3 x Per Week/30 Days Discharge Instructions: Apply to wound as directed. Do not cut. WOUND #6: - Toe Second Wound Laterality: Left, Distal Cleanser: Normal Saline (Generic) 3 x Per Week/30 Days Discharge Instructions: Wash your hands with soap and water. Remove old dressing, discard into plastic bag and place into trash. [name], [name] B. ([id number]) Cleanse the wound with Normal Saline prior to applying a clean dressing using gauze sponges, not tissues or cotton balls. Do not scrub or use excessive force. Pat dry using gauze sponges, not tissue or cotton balls. Primary Dressing: Prisma 4.34 (in) (Generic) 3 x Per Week/30 Days Discharge Instructions: Moisten w/normal saline or sterile water; Cover wound as directed. Do not remove from wound bed. Secondary Dressing: Coverlet Latex-Free Fabric Adhesive Dressings (Generic) 3 x Per Week/30 Days Discharge Instructions: 1.5 x 2 WOUND #7: - Achilles Wound Laterality: Left, Posterior Cleanser: Normal Saline (Generic) 3 x Per Week/30 Days Discharge Instructions: Wash your hands with soap and water. Remove old dressing, discard into plastic bag and place into trash. Cleanse the wound with Normal Saline prior to applying a clean dressing using gauze sponges, not tissues or cotton balls. Do not scrub or use excessive force. Pat dry using gauze sponges, not tissue or cotton balls. Primary Dressing: Prisma 4.34 (in) (Generic) 3 x Per Week/30 Days Discharge Instructions: Moisten w/normal saline or sterile water; Cover wound as directed. Do not remove from wound bed. Secondary Dressing: Mepilex Border Flex, 4x4 (in/in) (Generic) 3 x Per Week/30 Days Discharge Instructions: Apply to wound as directed. Do not cut. WOUND #9: - Forearm Wound Laterality: Right, Lateral Cleanser: Normal Saline (Generic) 3 x Per Week/30 Days Discharge Instructions: Wash your  hands with soap and water. Remove old dressing, discard into plastic bag and place into trash. Cleanse the wound with Normal Saline prior to applying a clean dressing using gauze sponges, not tissues or cotton balls. Do not scrub or use excessive force. Pat dry using gauze sponges, not tissue or cotton balls. Primary Dressing: Prisma 4.34 (in) (Generic) 3 x Per Week/30 Days Discharge Instructions: Moisten w/normal saline or sterile water; Cover wound as  directed. Do not remove from wound bed. Add-Ons: Coverlet Latex-Free Fabric Adhesive Dressings 3 x Per Week/30 Days Discharge Instructions: Elastic Knuckle Bandage 1. Would recommend currently that we going continue with the wound care measures as before and the patient is in agreement with plan. This includes the use of the silver collagen which I think is doing a good job pretty much to all areas followed by cover dressings. 2. I am also can recommend appropriate offloading I think she is doing a good job of this and overall seems to be doing quite well. 3. I am also can recommend the patient continue to monitor for any signs of worsening or infection. If anything occurs in the interim between now and when I see her next ischial let me know. We will see patient back for reevaluation in 1 week here in the clinic. If anything worsens or changes patient will contact our office for additional recommendations. Electronic Signature(s) Signed: 08/01/2020 7:26:12 PM By: Worthy Keeler PA-C Entered By: Worthy Keeler on 08/01/2020 19:26:12 DHRUTI, [name] ([id number]) -------------------------------------------------------------------------------- SuperBill Details Patient Name: [name] Date of Service: 08/01/2020 Medical Record Number: [id number] Patient Account Number: [id number] Date of Birth/Sex: [birth year] (70 y.o. 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 chronic ulcer of left ankle with fat layer exposed I10 Essential (primary) hypertension Z86.718 Personal history of other venous thrombosis and embolism Z79.01 Long term (current) use of anticoagulants Facility Procedures CPT4 Code: [id number] Description: 13244 - DEB SUBQ TISSUE 20 SQ CM/< Modifier: Quantity: 1 CPT4 Code: Description: ICD-10 Diagnosis Description L97.522 Non-pressure chronic ulcer of other part of left foot with fat layer exp Modifier: osed Quantity: Physician Procedures CPT4 Code: [id number] Description: 11042 - WC PHYS SUBQ TISS 20 SQ CM Modifier: Quantity: 1 CPT4 Code: Description: ICD-10 Diagnosis Description L97.522 Non-pressure chronic ulcer of other part of left foot with fat layer exp Modifier: osed Quantity: Electronic Signature(s) Signed: 08/01/2020 7:26:50 PM By: Worthy Keeler PA-C Entered By: Worthy Keeler on 08/01/2020 19:26:49

## 2020-08-02 ENCOUNTER — Ambulatory Visit: Payer: Medicare HMO | Admitting: Cardiovascular Disease

## 2020-08-02 DIAGNOSIS — I152 Hypertension secondary to endocrine disorders: Secondary | ICD-10-CM | POA: Diagnosis not present

## 2020-08-02 DIAGNOSIS — L97422 Non-pressure chronic ulcer of left heel and midfoot with fat layer exposed: Secondary | ICD-10-CM | POA: Diagnosis not present

## 2020-08-02 DIAGNOSIS — E1142 Type 2 diabetes mellitus with diabetic polyneuropathy: Secondary | ICD-10-CM | POA: Diagnosis not present

## 2020-08-02 DIAGNOSIS — E1159 Type 2 diabetes mellitus with other circulatory complications: Secondary | ICD-10-CM | POA: Diagnosis not present

## 2020-08-02 DIAGNOSIS — L97522 Non-pressure chronic ulcer of other part of left foot with fat layer exposed: Secondary | ICD-10-CM | POA: Diagnosis not present

## 2020-08-02 DIAGNOSIS — E11621 Type 2 diabetes mellitus with foot ulcer: Secondary | ICD-10-CM | POA: Diagnosis not present

## 2020-08-02 DIAGNOSIS — I69319 Unspecified symptoms and signs involving cognitive functions following cerebral infarction: Secondary | ICD-10-CM | POA: Diagnosis not present

## 2020-08-02 DIAGNOSIS — I4819 Other persistent atrial fibrillation: Secondary | ICD-10-CM | POA: Diagnosis not present

## 2020-08-02 DIAGNOSIS — I872 Venous insufficiency (chronic) (peripheral): Secondary | ICD-10-CM | POA: Diagnosis not present

## 2020-08-02 DIAGNOSIS — E1151 Type 2 diabetes mellitus with diabetic peripheral angiopathy without gangrene: Secondary | ICD-10-CM | POA: Diagnosis not present

## 2020-08-02 NOTE — Progress Notes (Signed)
CHAQUANA, LEDUFF ([id number]) Visit Report for 08/01/2020 Arrival Information Details Patient Name: [name], [name] Date of Service: 08/01/2020 10:45 AM Medical Record Number: [id number] Patient Account Number: [id number] Date of Birth/Sex: [birth year] (70 y.o. F) Treating RN: Donnamarie Poag Primary Care Genevie Elman: [name], Olivia Mackie Other Clinician: Referring Caleah Tortorelli: [name], Olivia Mackie Treating Shruti Arrey/Extender: Skipper Cliche in Treatment: 4 Visit Information History Since Last Visit Added or deleted any medications: No Patient Arrived: Wheel Chair Had a fall or experienced change in No Arrival Time: 10:43 activities of daily living that may affect Accompanied By: husband risk of falls: Transfer Assistance: EasyPivot Patient Lift Hospitalized since last visit: No Patient Identification Verified: Yes Has Dressing in Place as Prescribed: Yes Secondary Verification Process Completed: Yes Pain Present Now: Yes Electronic Signature(s) Signed: 08/02/2020 10:34:15 AM By: Donnamarie Poag Entered By: Donnamarie Poag on 08/01/2020 10:43:35 [name] ([id number]) -------------------------------------------------------------------------------- Encounter Discharge Information Details Patient Name: [name] Date of Service: 08/01/2020 10:45 AM Medical Record Number: [id number] Patient Account Number: [id number] Date of Birth/Sex: [birth year] (69 y.o. F) Treating RN: Dolan Amen Primary Care Caprisha Bridgett: [name], Olivia Mackie Other Clinician: Referring Orra Nolde: [name], Olivia Mackie Treating Malisa Ruggiero/Extender: Skipper Cliche in Treatment: 4 Encounter Discharge Information Items Post Procedure Vitals Discharge Condition: Stable Temperature (F): 97.6 Ambulatory Status: Wheelchair Pulse (bpm): 69 Discharge Destination: Home Respiratory Rate (breaths/min): 20 Transportation: Private Auto Blood Pressure (mmHg): 86/54 Accompanied By: husband Schedule Follow-up  Appointment: Yes Clinical Summary of Care: Electronic Signature(s) Signed: 08/01/2020 5:03:57 PM By: Jeanine Luz Entered By: Jeanine Luz on 08/01/2020 11:50:22 [name] ([id number]) -------------------------------------------------------------------------------- Lower Extremity Assessment Details Patient Name: [name] Date of Service: 08/01/2020 10:45 AM Medical Record Number: [id number] Patient Account Number: [id number] Date of Birth/Sex: 01-04-1951 (69 y.o. F) Treating RN: Donnamarie Poag Primary Care Sativa Gelles: [name], Olivia Mackie Other Clinician: Referring Alberto Pina: [name], Olivia Mackie Treating Kimanh Templeman/Extender: Jeri Cos Weeks in Treatment: 4 Edema Assessment Assessed: [Left: Yes] [Right: No] [Left: Edema] [Right: :] Vascular Assessment Pulses: Dorsalis Pedis Palpable: [Left:Yes] Electronic Signature(s) Signed: 08/02/2020 10:34:15 AM By: Donnamarie Poag Entered By: Donnamarie Poag on 08/01/2020 10:56:45 [name], [name] ([id number]) -------------------------------------------------------------------------------- Multi Wound Chart Details Patient Name: [name] Date of Service: 08/01/2020 10:45 AM Medical Record Number: [id number] Patient Account Number: [id number] Date of Birth/Sex: Apr 08, 1950 (69 y.o. F) Treating RN: Cornell Barman Primary Care Gerardo Territo: [name], Olivia Mackie Other Clinician: Referring Natori Gudino: [name], Olivia Mackie Treating Munirah Doerner/Extender: Skipper Cliche in Treatment: 4 Vital Signs Height(in): 65.5 Pulse(bpm): 31 Weight(lbs): 240 Blood Pressure(mmHg): 86/54 Body Mass Index(BMI): 39 Temperature(F): 97.6 Respiratory Rate(breaths/min): 20 Photos: [10:No Photos] Wound Location: Left Calcaneus Left, Distal Toe Second Left, Posterior Calcaneus Wounding Event: Gradually Appeared Pressure Injury Gradually Appeared Primary Etiology: Diabetic Wound/Ulcer of the Lower Diabetic Wound/Ulcer of the Lower Diabetic Wound/Ulcer  of the Lower Extremity Extremity Extremity Secondary Etiology: Pressure Ulcer N/A N/A Comorbid History: Asthma, Pneumothorax, Asthma, Pneumothorax, Asthma, Pneumothorax, Arrhythmia, Hypertension, Type II Arrhythmia, Hypertension, Type II Arrhythmia, Hypertension, Type II Diabetes, Gout, Osteoarthritis, Diabetes, Gout, Osteoarthritis, Diabetes, Gout, Osteoarthritis, Neuropathy Neuropathy Neuropathy Date Acquired: 07/25/2020 01/05/2020 03/23/2020 Weeks of Treatment: 0 4 4 Wound Status: Open Open Open Measurements L x W x D (cm) 0.2x0.2x0.2 0.2x0.2x0.1 0.5x0.3x0.1 Area (cm) : 0.031 0.031 0.118 Volume (cm) : 0.006 0.003 0.012 % Reduction in Area: 0.00% 84.20% -280.60% % Reduction in Volume: 0.00% 85.00% -300.00% Classification: Grade 2 Grade 1 Grade 1 Exudate Amount: Medium None Present Small Exudate Type: Serous N/A Serous Exudate Color: amber N/A amber Wound Margin: Flat and Intact N/A Flat and Intact  Granulation Amount: None Present (0%) None Present (0%) Large (67-100%) Granulation Quality: N/A N/A Pink, Pale Necrotic Amount: Large (67-100%) Large (67-100%) Small (1-33%) Necrotic Tissue: Adherent Lesage Exposed Structures: Fat Layer (Subcutaneous Tissue): Fat Layer (Subcutaneous Tissue): Fat Layer (Subcutaneous Tissue): Yes Yes Yes Fascia: No Fascia: No Fascia: No Tendon: No Tendon: No Tendon: No Muscle: No Muscle: No Muscle: No Joint: No Joint: No Joint: No Bone: No Bone: No Bone: No Epithelialization: None N/A Small (1-33%) Wound Number: 9 N/A N/A Photos: N/A N/A [name], [name] ([id number]) Wound Location: Right, Lateral Forearm N/A N/A Wounding Event: Skin Tear/Laceration N/A N/A Primary Etiology: Trauma, Other N/A N/A Secondary Etiology: N/A N/A N/A Comorbid History: Asthma, Pneumothorax, N/A N/A Arrhythmia, Hypertension, Type II Diabetes, Gout, Osteoarthritis, Neuropathy Date Acquired: 07/29/2020 N/A N/A Weeks of Treatment: 0 N/A  N/A Wound Status: Open N/A N/A Measurements L x W x D (cm) 0.3x0.2x0.1 N/A N/A Area (cm) : 0.047 N/A N/A Volume (cm) : 0.005 N/A N/A % Reduction in Area: N/A N/A N/A % Reduction in Volume: N/A N/A N/A Classification: Full Thickness Without Exposed N/A N/A Support Structures Exudate Amount: Small N/A N/A Exudate Type: Serosanguineous N/A N/A Exudate Color: red, brown N/A N/A Wound Margin: N/A N/A N/A Granulation Amount: Medium (34-66%) N/A N/A Granulation Quality: N/A N/A N/A Necrotic Amount: Medium (34-66%) N/A N/A Necrotic Tissue: Eschar N/A N/A Exposed Structures: Fat Layer (Subcutaneous Tissue): N/A N/A Yes Fascia: No Tendon: No Muscle: No Joint: No Bone: No Epithelialization: N/A N/A N/A Treatment Notes Electronic Signature(s) Signed: 08/01/2020 5:02:32 PM By: Gretta Cool, BSN, RN, CWS, Kim RN, BSN Entered By: Gretta Cool, BSN, RN, CWS, Kim on 08/01/2020 11:13:22 [name], [name] ([id number]) -------------------------------------------------------------------------------- [place] Details Patient Name: [name] Date of Service: 08/01/2020 10:45 AM Medical Record Number: [id number] Patient Account Number: [id number] Date of Birth/Sex: 1950/04/21 (69 y.o. F) Treating RN: Cornell Barman Primary Care Ryler Laskowski: [name], Olivia Mackie Other Clinician: Referring Katniss Weedman: [name], Olivia Mackie Treating Aleric Froelich/Extender: Skipper Cliche in Treatment: 4 Active Inactive Abuse / Safety / Falls / Self Care Management Nursing Diagnoses: Potential for injury related to falls Goals: Patient will remain injury free related to falls Date Initiated: 06/28/2020 Target Resolution Date: 07/28/2020 Goal Status: Active Interventions: Assess Activities of Daily Living upon admission and as needed Assess fall risk on admission and as needed Assess: immobility, friction, shearing, incontinence upon admission and as needed Assess impairment of mobility on admission and as  needed per policy Assess personal safety and home safety (as indicated) on admission and as needed Assess self care needs on admission and as needed Notes: Nutrition Nursing Diagnoses: Impaired glucose control: actual or potential Goals: Patient/caregiver verbalizes understanding of need to maintain therapeutic glucose control per primary care physician Date Initiated: 06/28/2020 Target Resolution Date: 07/28/2020 Goal Status: Active Interventions: Assess HgA1c results as ordered upon admission and as needed Assess patient nutrition upon admission and as needed per policy Notes: Wound/Skin Impairment Nursing Diagnoses: Knowledge deficit related to ulceration/compromised skin integrity Goals: Patient/caregiver will verbalize understanding of skin care regimen Date Initiated: 06/28/2020 Target Resolution Date: 07/28/2020 Goal Status: Active Ulcer/skin breakdown will have a volume reduction of 30% by week 4 Date Initiated: 06/28/2020 Target Resolution Date: 07/28/2020 Goal Status: Active Ulcer/skin breakdown will have a volume reduction of 50% by week 8 Date Initiated: 06/28/2020 Target Resolution Date: 08/28/2020 Goal Status: Active Ulcer/skin breakdown will have a volume reduction of 80% by week 12 Date Initiated: 06/28/2020 Target Resolution Date: 09/27/2020 [name], [name] ([id number])  Goal Status: Active Ulcer/skin breakdown will heal within 14 weeks Date Initiated: 06/28/2020 Target Resolution Date: 10/28/2020 Goal Status: Active Interventions: Assess patient/caregiver ability to obtain necessary supplies Assess patient/caregiver ability to perform ulcer/skin care regimen upon admission and as needed Assess ulceration(s) every visit Notes: Electronic Signature(s) Signed: 08/01/2020 5:02:32 PM By: Gretta Cool, BSN, RN, CWS, Kim RN, BSN Entered By: Gretta Cool, BSN, RN, CWS, Kim on 08/01/2020 11:13:12 [name], [name]  ([id number]) -------------------------------------------------------------------------------- Pain Assessment Details Patient Name: [name] Date of Service: 08/01/2020 10:45 AM Medical Record Number: [id number] Patient Account Number: [id number] Date of Birth/Sex: 07/24/1950 (69 y.o. F) Treating RN: Donnamarie Poag Primary Care Aivah Putman: [name], Olivia Mackie Other Clinician: Referring Mykelti Goldenstein: [name], Olivia Mackie Treating Torsha Lemus/Extender: Skipper Cliche in Treatment: 4 Active Problems Location of Pain Severity and Description of Pain Patient Has Paino Yes Site Locations Pain Location: Pain in Ulcers Rate the pain. Current Pain Level: 5 Pain Management and Medication Current Pain Management: Electronic Signature(s) Signed: 08/02/2020 10:34:15 AM By: Donnamarie Poag Entered By: Donnamarie Poag on 08/01/2020 10:44:33 [name], [name] ([id number]) -------------------------------------------------------------------------------- Patient/Caregiver Education Details Patient Name: [name] Date of Service: 08/01/2020 10:45 AM Medical Record Number: [id number] Patient Account Number: [id number] Date of Birth/Gender: [birth year] (69 y.o. F) Treating RN: Cornell Barman Primary Care Physician: [name], Olivia Mackie Other Clinician: Referring Physician: [name], Olivia Mackie Treating Physician/Extender: Skipper Cliche in Treatment: 4 Education Assessment Education Provided To: Patient Education Topics Provided Wound Debridement: Handouts: Wound Debridement Methods: Demonstration, Explain/Verbal Responses: State content correctly Wound/Skin Impairment: Handouts: Caring for Your Ulcer, Caring for Your Ulcer Spanish Methods: Demonstration, Explain/Verbal Responses: State content correctly Electronic Signature(s) Signed: 08/01/2020 5:02:32 PM By: Gretta Cool, BSN, RN, CWS, Kim RN, BSN Entered By: Gretta Cool, BSN, RN, CWS, Kim on 08/01/2020 11:19:46 [name], [name]  ([id number]) -------------------------------------------------------------------------------- Wound Assessment Details Patient Name: [name] Date of Service: 08/01/2020 10:45 AM Medical Record Number: [id number] Patient Account Number: [id number] Date of Birth/Sex: [birth year] (69 y.o. F) Treating RN: Cornell Barman Primary Care Hiroki Wint: [name], Olivia Mackie Other Clinician: Referring Chalese Peach: [name], Olivia Mackie Treating Quanah Majka/Extender: Skipper Cliche in Treatment: 4 Wound Status Wound Number: 10 Primary Diabetic Wound/Ulcer of the Lower Extremity Etiology: Wound Location: Left Calcaneus Secondary Pressure Ulcer Wounding Event: Gradually Appeared Etiology: Date Acquired: 07/25/2020 Wound Status: Open Weeks Of Treatment: 0 Comorbid Asthma, Pneumothorax, Arrhythmia, Hypertension, Type Clustered Wound: No History: II Diabetes, Gout, Osteoarthritis, Neuropathy Wound Measurements Length: (cm) 0.2 Width: (cm) 0.2 Depth: (cm) 0.2 Area: (cm) 0.031 Volume: (cm) 0.006 % Reduction in Area: 0% % Reduction in Volume: 0% Epithelialization: None Tunneling: No Undermining: No Wound Description Classification: Grade 2 Wound Margin: Flat and Intact Exudate Amount: Medium Exudate Type: Serous Exudate Color: amber Foul Odor After Cleansing: No Slough/Fibrino Yes Wound Bed Granulation Amount: None Present (0%) Exposed Structure Necrotic Amount: Large (67-100%) Fascia Exposed: No Necrotic Quality: Adherent Slough Fat Layer (Subcutaneous Tissue) Exposed: Yes Tendon Exposed: No Muscle Exposed: No Joint Exposed: No Bone Exposed: No Treatment Notes Wound #10 (Calcaneus) Wound Laterality: Left Cleanser Normal Saline Discharge Instruction: Wash your hands with soap and water. Remove old dressing, discard into plastic bag and place into trash. Cleanse the wound with Normal Saline prior to applying a clean dressing using gauze sponges, not tissues or cotton balls. Do  not scrub or use excessive force. Pat dry using gauze sponges, not tissue or cotton balls. Peri-Wound Care Topical Primary Dressing Prisma 4.34 (in) Discharge Instruction: Moisten w/normal saline or sterile water; Cover wound as directed. Do not remove from wound bed.  Secondary Dressing Mepilex Border Flex, 4x4 (in/in) Discharge Instruction: Apply to wound as directed. Do not cut. Secured With IZZABELLA, BESSE ([id number]) Compression Wrap Compression Stockings Add-Ons Electronic Signature(s) Signed: 08/01/2020 5:02:32 PM By: Gretta Cool, BSN, RN, CWS, Kim RN, BSN Entered By: Gretta Cool, BSN, RN, CWS, Kim on 08/01/2020 11:12:15 [name], [name] ([id number]) -------------------------------------------------------------------------------- Wound Assessment Details Patient Name: [name] Date of Service: 08/01/2020 10:45 AM Medical Record Number: [id number] Patient Account Number: [id number] Date of Birth/Sex: 05/30/1950 (69 y.o. F) Treating RN: Cornell Barman Primary Care Thao Bauza: [name], Olivia Mackie Other Clinician: Referring Enis Leatherwood: [name], Olivia Mackie Treating Jashayla Glatfelter/Extender: Skipper Cliche in Treatment: 4 Wound Status Wound Number: 6 Primary Diabetic Wound/Ulcer of the Lower Extremity Etiology: Wound Location: Left, Distal Toe Second Wound Open Wounding Event: Pressure Injury Status: Date Acquired: 01/05/2020 Comorbid Asthma, Pneumothorax, Arrhythmia, Hypertension, Type Weeks Of Treatment: 4 History: II Diabetes, Gout, Osteoarthritis, Neuropathy Clustered Wound: No Photos Wound Measurements Length: (cm) 0.1 Width: (cm) 0.1 Depth: (cm) 0.1 Area: (cm) 0.008 Volume: (cm) 0.001 % Reduction in Area: 95.9% % Reduction in Volume: 95% Epithelialization: Large (67-100%) Tunneling: No Undermining: No Wound Description Classification: Grade 1 Exudate Amount: None Present Foul Odor After Cleansing: No Slough/Fibrino Yes Wound Bed Granulation Amount: None  Present (0%) Exposed Structure Necrotic Amount: None Present (0%) Fascia Exposed: No Fat Layer (Subcutaneous Tissue) Exposed: No Tendon Exposed: No Muscle Exposed: No Joint Exposed: No Bone Exposed: No Treatment Notes Wound #6 (Toe Second) Wound Laterality: Left, Distal Cleanser Normal Saline Discharge Instruction: Wash your hands with soap and water. Remove old dressing, discard into plastic bag and place into trash. Cleanse the wound with Normal Saline prior to applying a clean dressing using gauze sponges, not tissues or cotton balls. Do not scrub or use excessive force. Pat dry using gauze sponges, not tissue or cotton balls. Peri-Wound Care LATIKA, KRONICK B. ([id number]) Topical Primary Dressing Prisma 4.34 (in) Discharge Instruction: Moisten w/normal saline or sterile water; Cover wound as directed. Do not remove from wound bed. Secondary Dressing Coverlet Latex-Free Fabric Adhesive Dressings Discharge Instruction: 1.5 x 2 Secured With Compression Wrap Compression Stockings Add-Ons Electronic Signature(s) Signed: 08/01/2020 5:02:32 PM By: Gretta Cool, BSN, RN, CWS, Kim RN, BSN Entered By: Gretta Cool, BSN, RN, CWS, Kim on 08/01/2020 11:13:57 [name], [place] ([id number]) -------------------------------------------------------------------------------- Wound Assessment Details Patient Name: [name] Date of Service: 08/01/2020 10:45 AM Medical Record Number: [id number] Patient Account Number: [id number] Date of Birth/Sex: 01/28/1951 (69 y.o. F) Treating RN: Donnamarie Poag Primary Care Tysheka Fanguy: [name], Olivia Mackie Other Clinician: Referring Reyhan Moronta: [name], Olivia Mackie Treating Glendola Friedhoff/Extender: Skipper Cliche in Treatment: 4 Wound Status Wound Number: 7 Primary Diabetic Wound/Ulcer of the Lower Extremity Etiology: Wound Location: Left, Posterior Calcaneus Wound Open Wounding Event: Gradually Appeared Status: Date Acquired: 03/23/2020 Comorbid Asthma,  Pneumothorax, Arrhythmia, Hypertension, Type Weeks Of Treatment: 4 History: II Diabetes, Gout, Osteoarthritis, Neuropathy Clustered Wound: No Photos Wound Measurements Length: (cm) 0.5 Width: (cm) 0.3 Depth: (cm) 0.1 Area: (cm) 0.118 Volume: (cm) 0.012 % Reduction in Area: -280.6% % Reduction in Volume: -300% Epithelialization: Small (1-33%) Tunneling: No Undermining: No Wound Description Classification: Grade 1 Wound Margin: Flat and Intact Exudate Amount: Small Exudate Type: Serous Exudate Color: amber Foul Odor After Cleansing: No Slough/Fibrino Yes Wound Bed Granulation Amount: Large (67-100%) Exposed Structure Granulation Quality: Pink, Pale Fascia Exposed: No Necrotic Amount: Small (1-33%) Fat Layer (Subcutaneous Tissue) Exposed: Yes Necrotic Quality: Adherent Slough Tendon Exposed: No Muscle Exposed: No Joint Exposed: No Bone Exposed: No Electronic Signature(s) Signed: 08/02/2020 10:34:15 AM  By: Donnamarie Poag Entered ByDonnamarie Poag on 08/01/2020 10:54:22 Harden, [name] ([id number]) -------------------------------------------------------------------------------- Wound Assessment Details Patient Name: [name] Date of Service: 08/01/2020 10:45 AM Medical Record Number: [id number] Patient Account Number: [id number] Date of Birth/Sex: 15-Aug-1950 (69 y.o. F) Treating RN: Donnamarie Poag Primary Care Naquita Nappier: [name], Olivia Mackie Other Clinician: Referring Arrietty Dercole: [name], Olivia Mackie Treating Telesforo Brosnahan/Extender: Skipper Cliche in Treatment: 4 Wound Status Wound Number: 9 Primary Trauma, Other Etiology: Wound Location: Right, Lateral Forearm Wound Open Wounding Event: Skin Tear/Laceration Status: Date Acquired: 07/29/2020 Comorbid Asthma, Pneumothorax, Arrhythmia, Hypertension, Type Weeks Of Treatment: 0 History: II Diabetes, Gout, Osteoarthritis, Neuropathy Clustered Wound: No Photos Wound Measurements Length: (cm) 0.3 Width: (cm)  0.2 Depth: (cm) 0.1 Area: (cm) 0.047 Volume: (cm) 0.005 % Reduction in Area: % Reduction in Volume: Tunneling: No Undermining: No Wound Description Classification: Full Thickness Without Exposed Support Structures Exudate Amount: Small Exudate Type: Serosanguineous Exudate Color: red, brown Foul Odor After Cleansing: No Slough/Fibrino Yes Wound Bed Granulation Amount: Medium (34-66%) Exposed Structure Necrotic Amount: Medium (34-66%) Fascia Exposed: No Necrotic Quality: Eschar Fat Layer (Subcutaneous Tissue) Exposed: Yes Tendon Exposed: No Muscle Exposed: No Joint Exposed: No Bone Exposed: No Treatment Notes Wound #9 (Forearm) Wound Laterality: Right, Lateral Cleanser Normal Saline Discharge Instruction: Wash your hands with soap and water. Remove old dressing, discard into plastic bag and place into trash. Cleanse the wound with Normal Saline prior to applying a clean dressing using gauze sponges, not tissues or cotton balls. Do not scrub or use excessive force. Pat dry using gauze sponges, not tissue or cotton balls. VIONE, EVERTON ([id number]) Peri-Wound Care Topical Primary Dressing Prisma 4.34 (in) Discharge Instruction: Moisten w/normal saline or sterile water; Cover wound as directed. Do not remove from wound bed. Secondary Dressing Secured With Compression Wrap Compression Stockings Add-Ons Coverlet Latex-Free Fabric Adhesive Dressings Discharge Instruction: Catering manager Signature(s) Signed: 08/02/2020 10:34:15 AM By: Donnamarie Poag Entered By: Donnamarie Poag on 08/01/2020 10:56:18 [name] ([id number]) -------------------------------------------------------------------------------- Vitals Details Patient Name: [name] Date of Service: 08/01/2020 10:45 AM Medical Record Number: [id number] Patient Account Number: [id number] Date of Birth/Sex: 06-23-1950 (69 y.o. F) Treating RN: Donnamarie Poag Primary Care Tylah Mancillas:  [name], Olivia Mackie Other Clinician: Referring Jaqlyn Gruenhagen: [name], Olivia Mackie Treating [place] Winslow/Extender: Skipper Cliche in Treatment: 4 Vital Signs Time Taken: 10:43 Temperature (F): 97.6 Height (in): 65.5 Pulse (bpm): 69 Weight (lbs): 240 Respiratory Rate (breaths/min): 20 Body Mass Index (BMI): 39.3 Blood Pressure (mmHg): 86/54 Reference Range: 80 - 120 mg / dl Electronic Signature(s) Signed: 08/02/2020 10:34:15 AM By: Donnamarie Poag Entered ByDonnamarie Poag on 08/01/2020 10:44:22

## 2020-08-05 DIAGNOSIS — M19012 Primary osteoarthritis, left shoulder: Secondary | ICD-10-CM | POA: Diagnosis not present

## 2020-08-05 DIAGNOSIS — W010XXD Fall on same level from slipping, tripping and stumbling without subsequent striking against object, subsequent encounter: Secondary | ICD-10-CM | POA: Diagnosis not present

## 2020-08-05 DIAGNOSIS — G8929 Other chronic pain: Secondary | ICD-10-CM | POA: Diagnosis not present

## 2020-08-05 DIAGNOSIS — M25512 Pain in left shoulder: Secondary | ICD-10-CM | POA: Diagnosis not present

## 2020-08-06 DIAGNOSIS — I69319 Unspecified symptoms and signs involving cognitive functions following cerebral infarction: Secondary | ICD-10-CM | POA: Diagnosis not present

## 2020-08-06 DIAGNOSIS — L97522 Non-pressure chronic ulcer of other part of left foot with fat layer exposed: Secondary | ICD-10-CM | POA: Diagnosis not present

## 2020-08-06 DIAGNOSIS — E1159 Type 2 diabetes mellitus with other circulatory complications: Secondary | ICD-10-CM | POA: Diagnosis not present

## 2020-08-06 DIAGNOSIS — I152 Hypertension secondary to endocrine disorders: Secondary | ICD-10-CM | POA: Diagnosis not present

## 2020-08-06 DIAGNOSIS — E1151 Type 2 diabetes mellitus with diabetic peripheral angiopathy without gangrene: Secondary | ICD-10-CM | POA: Diagnosis not present

## 2020-08-06 DIAGNOSIS — I872 Venous insufficiency (chronic) (peripheral): Secondary | ICD-10-CM | POA: Diagnosis not present

## 2020-08-06 DIAGNOSIS — I4819 Other persistent atrial fibrillation: Secondary | ICD-10-CM | POA: Diagnosis not present

## 2020-08-06 DIAGNOSIS — E1142 Type 2 diabetes mellitus with diabetic polyneuropathy: Secondary | ICD-10-CM | POA: Diagnosis not present

## 2020-08-06 DIAGNOSIS — E11621 Type 2 diabetes mellitus with foot ulcer: Secondary | ICD-10-CM | POA: Diagnosis not present

## 2020-08-06 DIAGNOSIS — L97422 Non-pressure chronic ulcer of left heel and midfoot with fat layer exposed: Secondary | ICD-10-CM | POA: Diagnosis not present

## 2020-08-08 ENCOUNTER — Other Ambulatory Visit: Payer: Self-pay | Admitting: Pain Medicine

## 2020-08-08 ENCOUNTER — Other Ambulatory Visit: Payer: Self-pay | Admitting: Internal Medicine

## 2020-08-08 DIAGNOSIS — E1151 Type 2 diabetes mellitus with diabetic peripheral angiopathy without gangrene: Secondary | ICD-10-CM | POA: Diagnosis not present

## 2020-08-08 DIAGNOSIS — I69319 Unspecified symptoms and signs involving cognitive functions following cerebral infarction: Secondary | ICD-10-CM | POA: Diagnosis not present

## 2020-08-08 DIAGNOSIS — L97522 Non-pressure chronic ulcer of other part of left foot with fat layer exposed: Secondary | ICD-10-CM | POA: Diagnosis not present

## 2020-08-08 DIAGNOSIS — I4819 Other persistent atrial fibrillation: Secondary | ICD-10-CM | POA: Diagnosis not present

## 2020-08-08 DIAGNOSIS — E1159 Type 2 diabetes mellitus with other circulatory complications: Secondary | ICD-10-CM | POA: Diagnosis not present

## 2020-08-08 DIAGNOSIS — J4540 Moderate persistent asthma, uncomplicated: Secondary | ICD-10-CM

## 2020-08-08 DIAGNOSIS — E1142 Type 2 diabetes mellitus with diabetic polyneuropathy: Secondary | ICD-10-CM

## 2020-08-08 DIAGNOSIS — G4733 Obstructive sleep apnea (adult) (pediatric): Secondary | ICD-10-CM | POA: Diagnosis not present

## 2020-08-08 DIAGNOSIS — L97422 Non-pressure chronic ulcer of left heel and midfoot with fat layer exposed: Secondary | ICD-10-CM | POA: Diagnosis not present

## 2020-08-08 DIAGNOSIS — F418 Other specified anxiety disorders: Secondary | ICD-10-CM

## 2020-08-08 DIAGNOSIS — I872 Venous insufficiency (chronic) (peripheral): Secondary | ICD-10-CM | POA: Diagnosis not present

## 2020-08-08 DIAGNOSIS — M797 Fibromyalgia: Secondary | ICD-10-CM

## 2020-08-08 DIAGNOSIS — E11621 Type 2 diabetes mellitus with foot ulcer: Secondary | ICD-10-CM | POA: Diagnosis not present

## 2020-08-08 DIAGNOSIS — I152 Hypertension secondary to endocrine disorders: Secondary | ICD-10-CM | POA: Diagnosis not present

## 2020-08-09 ENCOUNTER — Encounter: Payer: Medicare HMO | Admitting: Physician Assistant

## 2020-08-09 DIAGNOSIS — G4733 Obstructive sleep apnea (adult) (pediatric): Secondary | ICD-10-CM | POA: Diagnosis not present

## 2020-08-11 ENCOUNTER — Encounter (INDEPENDENT_AMBULATORY_CARE_PROVIDER_SITE_OTHER): Payer: Self-pay | Admitting: Nurse Practitioner

## 2020-08-11 NOTE — Progress Notes (Signed)
Subjective:    Patient ID: [name], adult    DOB: 18-Oct-1950, 70 y.o.   MRN: [id number] Chief Complaint  Patient presents with  . Follow-up    3week follow up    [name] is a 70 year old female that returns today for lower extremity pain and ulcerations.  The patient is well-known to our practice.  The patient has had a longstanding history of ulcerations.  There was concern that her ulcerations have not been healing due to her lack of perfusion.  However the patient previously underwent angiogram which found no evidence of significant stenosis.  The patient's recent noninvasive studies do not suggest severe peripheral arterial disease.  We will have the patient follow-up today to see if her wound has been healing sufficiently.  The patient notes that her wounds are either completely healed or in some cases regularly.  This is correlated with the notes from the wound care center.     Review of Systems  Skin: Positive for wound.  Neurological: Positive for weakness.  All other systems reviewed and are negative.      Objective:   Physical Exam Vitals reviewed.  HENT:     Head: Normocephalic.  Cardiovascular:     Rate and Rhythm: Normal rate.     Pulses: Decreased pulses.  Pulmonary:     Effort: Pulmonary effort is normal.  Neurological:     Mental Status: She is alert and oriented to person, place, and time.     Motor: Weakness present.     Gait: Gait abnormal.  Psychiatric:        Mood and Affect: Mood normal.        Behavior: Behavior normal.        Thought Content: Thought content normal.        Judgment: Judgment normal.     BP 90/60 (BP Location: Right Arm)   Pulse 69   Resp 16   Past Medical History:  Diagnosis Date  . Abnormal antibody titer   . Anxiety and depression   . Arthritis   . Asthma   . Cystocele   . Depression   . Diabetes ([place])   . DVT (deep venous thrombosis) (HCC)    Righ calf  . Dyspnea    11/08/2018 - "more now due to lack  of exercise"  . Dysrhythmia    afib  . Edema   . GERD (gastroesophageal reflux disease)   . Heart murmur    Echocardiogram June 2015: Mild MR (possible vegetation seen on TTE, not seen on TEE), normal LV size with moderate concentric LVH. Normal function EF 60-65%. Normal diastolic function. Mild LA dilation.  Marland Kitchen History of IBS   . History of kidney stones   . History of methicillin resistant staphylococcus aureus (MRSA) 2008  . Hyperlipidemia   . Hypertension   . Hypothyroidism   . Insomnia   . Kidney stone    kidney stones with lithotripsy .  [facility]- "adrenal glands"  . Neuropathy involving both lower extremities   . Obesity, Class II, BMI 35-39.9, with comorbidity   . Paroxysmal atrial fibrillation ([place]) 2007   In June 2015, Cardiac Event Monitor: Mostly SR/sinus arrhythmia with PVCs that are frequent. Short bursts of A. fib lasting several minutes;; CHA2DS2-VASc Score = 5 (age, Female, PVD, DM, HTN)  . Peripheral vascular disease ([place])   . Sleep apnea    use C-PAP  . Urinary incontinence   . Venous stasis dermatitis of both lower  extremities     Social History   Socioeconomic History  . Marital status: Married    Spouse name: Not on file  . Number of children: Not on file  . Years of education: Not on file  . Highest education level: Not on file  Occupational History  . Not on file  Tobacco Use  . Smoking status: Never Smoker  . Smokeless tobacco: Never Used  Vaping Use  . Vaping Use: Never used  Substance and Sexual Activity  . Alcohol use: No    Alcohol/week: 0.0 standard drinks  . Drug use: Never  . Sexual activity: Yes  Other Topics Concern  . Not on file  Social History Narrative   She is currently married -- for 30+ years. Does not work. Does not smoke or take alcohol. She never smoked. She exercises at least 3 days a week since before her gastric surgery.   Marital status reviewed in history of present illness.   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 Difficulty of Paying Living Expenses: Not very hard  Food Insecurity: No Food Insecurity  . Worried About Charity fundraiser in the Last Year: Never true  . Ran Out of Food in the Last Year: Never true  Transportation Needs: Unmet Transportation Needs  . Lack of Transportation (Medical): Yes  . Lack of Transportation (Non-Medical): Yes  Physical Activity: Not on file  Stress: Not on file  Social Connections: Not on file  Intimate Partner Violence: Not on file    Past Surgical History:  Procedure Laterality Date  . ANKLE SURGERY Right   . APPLICATION OF WOUND VAC Left 06/27/2015   Procedure: APPLICATION OF WOUND VAC ( POSSIBLE ) ;  Surgeon: [name], MD;  Location: [facility] ORS;  Service: Vascular;  Laterality: Left;  . APPLICATION OF WOUND VAC Left 09/25/2016   Procedure: APPLICATION OF WOUND VAC;  Surgeon: [name], DPM;  Location: [facility];  Service: Podiatry;  Laterality: Left;  . arm surgery     right    . CHOLECYSTECTOMY    . COLONOSCOPY    . COLONOSCOPY WITH PROPOFOL N/A 01/11/2017   Procedure: COLONOSCOPY WITH PROPOFOL;  Surgeon: [name], MD;  Location: [facility] ENDOSCOPY;  Service: Endoscopy;  Laterality: N/A;  . COLONOSCOPY WITH PROPOFOL N/A 02/22/2017   Procedure: COLONOSCOPY WITH PROPOFOL;  Surgeon: [name], MD;  Location: [facility] ENDOSCOPY;  Service: Endoscopy;  Laterality: N/A;  . COLONOSCOPY WITH PROPOFOL N/A 05/31/2017   Procedure: COLONOSCOPY WITH PROPOFOL;  Surgeon: [name], MD;  Location: [facility] ENDOSCOPY;  Service: Endoscopy;  Laterality: N/A;  . ESOPHAGOGASTRODUODENOSCOPY (EGD) WITH PROPOFOL N/A 01/11/2017   Procedure: ESOPHAGOGASTRODUODENOSCOPY (EGD) WITH PROPOFOL;  Surgeon: [name], MD;  Location: [facility] ENDOSCOPY;  Service: Endoscopy;  Laterality: N/A;  .  ESOPHAGOGASTRODUODENOSCOPY (EGD) WITH PROPOFOL N/A 10/25/2018   Procedure: ESOPHAGOGASTRODUODENOSCOPY (EGD) WITH PROPOFOL;  Surgeon: [name], MD;  Location: [facility] ENDOSCOPY;  Service: Endoscopy;  Laterality: N/A;  . ESOPHAGOGASTRODUODENOSCOPY (EGD) WITH PROPOFOL N/A 11/29/2019   Procedure: ESOPHAGOGASTRODUODENOSCOPY (EGD) WITH PROPOFOL;  Surgeon: Toledo, Benay Pike, MD;  Location: [facility] ENDOSCOPY;  Service: Gastroenterology;  Laterality: N/A;  . HERNIA REPAIR     umbilical  . HIATAL HERNIA REPAIR    . I & D EXTREMITY Left 06/27/2015   Procedure: IRRIGATION  AND DEBRIDEMENT EXTREMITY            ( CALF HEMATOMA ) POSSIBLE WOUND VAC;  Surgeon: [name], MD;  Location: [facility] ORS;  Service: Vascular;  Laterality: Left;  . INCISION AND DRAINAGE OF WOUND Left 09/25/2016   Procedure: IRRIGATION AND DEBRIDEMENT - PARTIAL RESECTION OF ACHILLES TENDON WITH WOUND VAC APPLICATION;  Surgeon: [name], DPM;  Location: [facility] ORS;  Service: Podiatry;  Laterality: Left;  . INCISION AND DRAINAGE OF WOUND Left 11/09/2018   Procedure: Excision of left foot wound with ACell placement;  Surgeon: Wallace Going, DO;  Location: [place];  Service: Plastics;  Laterality: Left;  . IRRIGATION AND DEBRIDEMENT ABSCESS Left 09/07/2016   Procedure: IRRIGATION AND DEBRIDEMENT ABSCESS LEFT HEEL;  Surgeon: [name], DPM;  Location: [facility];  Service: Podiatry;  Laterality: Left;  . JOINT REPLACEMENT  2013   left knee replacement  . KIDNEY SURGERY Right    kidney stones  . LITHOTRIPSY    . LOWER EXTREMITY ANGIOGRAPHY Left 04/04/2018   Procedure: LOWER EXTREMITY ANGIOGRAPHY;  Surgeon: [name], MD;  Location: [place] CV LAB;  Service: Cardiovascular;  Laterality: Left;  . NM GATED MYOVIEW ([place] [facility])  February 2017   Likely breast attenuation. LOW RISK study. Normal EF 55-60%.  . OTHER SURGICAL HISTORY     08/2016 or 09/2016 surgery on achilles tenso h/o staph infection heal. Dr. [name]  . removal of  left hematoma Left    leg  . REVERSE SHOULDER ARTHROPLASTY Right 10/08/2015   Procedure: REVERSE SHOULDER ARTHROPLASTY;  Surgeon: [name], MD;  Location: [facility] OR[facility];  Service: Orthopedics;  Laterality: Right;  . SLEEVE GASTROPLASTY    . TONSILLECTOMY    . TOTAL KNEE ARTHROPLASTY Left   . TOTAL SHOULDER REPLACEMENT Right   . TRANSESOPHAGEAL ECHOCARDIOGRAM  08/08/2013   Mild LVH, EF 60-65%. Moderate LA dilation and mild RA dilation. Mild MR with no evidence of stenosis and no evidence of endocarditis. A false chordae is noted.  . TRANSTHORACIC ECHOCARDIOGRAM  08/03/2013   Mild-Moderate concentric LVH, EF 60-65%. Normal diastolic function. Mild LA dilation. Mild MR with possible vegetation  - not confirmed on TEE   . ULNAR NERVE TRANSPOSITION Right 08/06/2016   Procedure: ULNAR NERVE DECOMPRESSION/TRANSPOSITION;  Surgeon: [name], MD;  Location: [facility] ORS;  Service: Orthopedics;  Laterality: Right;  . UPPER GI ENDOSCOPY    . VAGINAL HYSTERECTOMY      Family History  Problem Relation Age of Onset  . Skin cancer Father   . Diabetes Father   . Hypertension Father   . Peripheral vascular disease Father   . Cancer Father   . Cerebral aneurysm Father   . Alcohol abuse Father   . Varicose Veins Mother   . Kidney disease Mother   . Arthritis Mother   . Mental illness Sister   . Cancer Maternal Aunt        breast  . Breast cancer Maternal Aunt   . Arthritis Maternal Grandmother   . Hypertension Maternal Grandmother   . Diabetes Maternal Grandmother   . Arthritis Maternal Grandfather   . Heart disease Maternal Grandfather   . Hypertension Maternal Grandfather   . Arthritis Paternal Grandmother   . Hypertension Paternal Grandmother   . Diabetes Paternal Grandmother   . Arthritis Paternal Grandfather   . Hypertension Paternal Grandfather   . Bladder Cancer Neg Hx   . Kidney cancer Neg Hx     Allergies  Allergen Reactions  . Morphine And Related Other (See Comments) and  Nausea Only    Patient becomes very confused Other reaction(s): Other (See Comments), Other (See Comments) Patient becomes very confused Patient becomes very confused   . Penicillins Hives, Shortness Of Breath, Swelling and Other (See Comments)    Facial swelling Has patient had a PCN reaction causing immediate rash, facial/tongue/throat swelling, SOB or lightheadedness with hypotension: Yes Has patient had a PCN reaction causing severe rash involving mucus membranes or skin necrosis: Yes Has patient had a PCN reaction that required hospitalization No Has patient had a PCN reaction occurring within the last 10 years: No If all of the above answers are "NO", then may proceed with Cephalosporin use.  Other reaction(s): Other (See Comments), Other (See Comments) Facial swelling Has patient had a PCN reaction causing immediate rash, facial/tongue/throat swelling, SOB or lightheadedness with hypotension: Yes Has patient had a PCN reaction causing severe rash involving mucus membranes or skin necrosis: Yes Has patient had a PCN reaction that required hospitalization No Has patient had a PCN reaction occurring within the last 10 years: No If all of the above answers are "NO", then may proceed with Cephalosporin use. Facial swelling Has patient had a PCN reaction causing immediate rash, facial/tongue/throat swelling, SOB or lightheadedness with hypotension: Yes Has patient had a PCN reaction causing severe rash involving mucus membranes or skin necrosis: Yes Has patient had a PCN reaction that required hospitalization No Has patient had a PCN reaction occurring within the last 10 years: No If all of the above answers are "NO", then may proceed with Cephalosporin use. Facial swelling Has patient had a PCN reaction causing immediate rash, facial/tongue/throat swelling, SOB or lightheadedness with hypotension: Yes Has patient had a PCN reaction causing severe rash involving mucus membranes or skin  necrosis: Yes Has patient had a PCN reaction that required hospitalization No Has patient had a PCN reaction occurring within the last 10 years: No If all of the above answers are "NO", then may proceed with Cephalosporin use.   . Ambien [Zolpidem]     Hallucination   . Aspirin Hives  . Oxytetracycline Hives    Other reaction(s): Unknown     CBC Latest Ref Rng & Units 04/13/2020 04/10/2020 04/05/2020  WBC 4.0 - 10.5 K/uL 6.9 6.1 7.1  Hemoglobin 12.0 - 15.0 g/dL 10.3(L) 9.5(L) 10.0(L)  Hematocrit 36.0 - 46.0 % 33.1(L) 29.6(L) 32.9(L)  Platelets 150 - 400 K/uL 227 227 247      CMP     Component Value Date/Time   NA 138 04/14/2020 0623   NA 139 11/09/2019 1055   NA 140 09/26/2013 0428   K 4.7 04/14/2020 0623   K 3.9 09/26/2013 0428   CL 101 04/14/2020 0623   CL 105 09/26/2013 0428   CO2 25 04/14/2020 0623   CO2 29 09/26/2013 0428   GLUCOSE 113 (H) 04/14/2020 0623   GLUCOSE 134 (H) 09/26/2013 0428   BUN 25 (H) 04/14/2020 0623   BUN 25 11/09/2019 1055   BUN 16 09/26/2013 0428   CREATININE 0.86 04/14/2020 0623   CREATININE 0.84 09/27/2013 2028   CALCIUM 8.8 (L) 04/14/2020 0623   CALCIUM 8.4 (L) 09/26/2013 0428   PROT 7.4 04/05/2020 1215   PROT 6.5 10/10/2014 0822   PROT 7.5 09/25/2013 1540   ALBUMIN 3.3 (L) 04/05/2020 1215   ALBUMIN 4.2 10/10/2014 0822   ALBUMIN 3.7 09/25/2013 1540   AST 18 04/05/2020 1215   AST 24 09/25/2013 1540  ALT 14 04/05/2020 1215   ALT 27 09/25/2013 1540   ALKPHOS 88 04/05/2020 1215   ALKPHOS 102 09/25/2013 1540   BILITOT 0.5 04/05/2020 1215   BILITOT 0.3 10/10/2014 0822   BILITOT 0.3 09/25/2013 1540   GFRNONAA >60 04/14/2020 0623   GFRNONAA >60 09/27/2013 2028   GFRAA 58 (L) 12/25/2019 0308   GFRAA >60 09/27/2013 2028     VAS [place] ABI WITH/WO TBI  Result Date: 06/28/2020 LOWER EXTREMITY DOPPLER STUDY Indications: Rest pain, and ulceration. High Risk Factors: Hypertension.  Comparison Study: 03/22/2018 Performing Technologist: Concha Norway RVT  Examination Guidelines: A complete evaluation includes at minimum, Doppler waveform signals and systolic blood pressure reading at the level of bilateral brachial, anterior tibial, and posterior tibial arteries, when vessel segments are accessible. Bilateral testing is considered an integral part of a complete examination. Photoelectric Plethysmograph (PPG) waveforms and toe systolic pressure readings are included as required and additional duplex testing as needed. Limited examinations for reoccurring indications may be performed as noted.  ABI Findings: +---------+------------------+-----+---------+--------+ Right    Rt Pressure (mmHg)IndexWaveform Comment  +---------+------------------+-----+---------+--------+ Brachial 132                                      +---------+------------------+-----+---------+--------+ ATA      149               1.13 triphasic         +---------+------------------+-----+---------+--------+ PTA      141               1.07 triphasic         +---------+------------------+-----+---------+--------+ Great Toe127               0.96 Normal            +---------+------------------+-----+---------+--------+ +---------+------------------+-----+--------+-------+ Left     Lt Pressure (mmHg)IndexWaveformComment +---------+------------------+-----+--------+-------+ ATA      139               1.05                 +---------+------------------+-----+--------+-------+ PTA      161               1.22                 +---------+------------------+-----+--------+-------+ Great Toe94                0.71 Abnormal        +---------+------------------+-----+--------+-------+ +-------+-----------+-----------+------------+------------+ ABI/TBIToday's ABIToday's TBIPrevious ABIPrevious TBI +-------+-----------+-----------+------------+------------+ Right  1.13       .96        1.21        .76           +-------+-----------+-----------+------------+------------+ Left   1.22       .71        1.15        .89          +-------+-----------+-----------+------------+------------+ Bilateral ABIs and TBIs appear essentially unchanged compared to prior study on 02/2018.  Summary: Right: Resting right ankle-brachial index is within normal range. No evidence of significant right lower extremity arterial disease. The right toe-brachial index is normal. Left: Resting left ankle-brachial index is within normal range. No evidence of significant left lower extremity arterial disease. The left toe-brachial index is normal.  *See table(s) above for measurements and observations.  Electronically signed by Leotis Pain MD on 06/28/2020 at  10:09:21 AM.    Final        Assessment & Plan:   1. Nonhealing ulcer of heel ([place]) Based upon the patient's admission as well as with notes from wound.  The patient's wound does appear to be healing.  The patient will continue with wound care for wound treatment and we will continue with close follow-up.  2. Essential hypertension Continue antihypertensive medications as already ordered, these medications have been reviewed and there are no changes at this time.   3. PAD (peripheral artery disease) ([place]) Per our discussion previously, the patient is showing evidence of wound healing so we will plan on continuing with close follow-up at this time.  Previously the patient had ABIs and TBI's that are within normal limits however there are abnormal toe waveforms on her left lower extremity.    I had a long discussion with the patient and her husband in regards to the difference between atherosclerotic disease within the main vessels versus microvascular disease.  The patient previously underwent a left lower extremity angiogram for something similar on 04/04/2018.  Ultimately the patient had no significant stenosis that was present in her lower extremities.  I suspect that the patient  does have microvascular disease which is not amenable to endovascular intervention.  Currently the patient's wounds are healing and so we will continue with close follow-up in 3 months with ABIs   Current Outpatient Medications on File Prior to Visit  Medication Sig Dispense Refill  . acetaminophen (TYLENOL) 325 MG tablet Take 2 tablets (650 mg total) by mouth every 6 (six) hours as needed for mild pain or moderate pain (or Fever >/= 101).    Marland Kitchen albuterol (PROVENTIL) (2.5 MG/3ML) 0.083% nebulizer solution Take 2.5 mg by nebulization every 4 (four) hours as needed for wheezing or shortness of breath.    . ALPRAZolam (XANAX) 0.5 MG tablet TAKE 1/2 TABLET BY MOUTH TWICE DAILY AS NEEDED FOR ANXIETY 60 tablet 2  . apixaban (ELIQUIS) 5 MG TABS tablet Take 1 tablet (5 mg total) by mouth 2 (two) times daily. 180 tablet 2  . buPROPion (WELLBUTRIN XL) 150 MG 24 hr tablet Take 1 tablet (150 mg total) by mouth daily. 90 tablet 3  . calcium-vitamin D (OSCAL WITH D) 500-200 MG-UNIT tablet Take 1 tablet by mouth 2 (two) times daily with a meal. 60 tablet 0  . cholecalciferol (VITAMIN D) 25 MCG tablet Take 1 tablet (1,000 Units total) by mouth 2 (two) times daily with a meal. 30 tablet 2  . clobetasol cream (TEMOVATE) 8.84 % Apply 1 application topically 2 (two) times daily as needed (skin irritation).     . colchicine 0.6 MG tablet Take 1 tablet (0.6 mg total) by mouth as needed. May repeat 1 pill in 1 hr. No more than 3 total pills 1.8 mg in 24 hours as needed gout flare 30 tablet 2  . diclofenac Sodium (VOLTAREN) 1 % GEL Apply 2 g topically 3 (three) times daily. 100 g 2  . DULoxetine (CYMBALTA) 60 MG capsule Take 1 capsule (60 mg total) by mouth daily. 30 capsule 0  . ergocalciferol (VITAMIN D2) 1.25 MG (50000 UT) capsule Take 50,000 Units by mouth once a week.    . estradiol (ESTRACE) 0.1 MG/GM vaginal cream Place 1 Applicatorful vaginally every Monday, Wednesday, and Friday. Apply 0.5mg  (pea-sized amount)   just inside the vaginal introitus with a finger-tip on Monday, Wednesday and Friday nights, 42.5 g 11  . fluticasone-salmeterol (ADVAIR HFA) 115-21 MCG/ACT  inhaler Inhale 2 puffs into the lungs daily. Rinse mouth thoroughly after use 1 Inhaler 12  . folic acid (FOLVITE) 1 MG tablet Take 1 tablet (1 mg total) by mouth daily. 90 tablet 3  . HYDROcodone-acetaminophen (NORCO/VICODIN) 5-325 MG tablet Take 1 tablet by mouth every 8 (eight) hours as needed for severe pain. Must last 30 days 90 tablet 0  . [START ON 08/30/2020] HYDROcodone-acetaminophen (NORCO/VICODIN) 5-325 MG tablet Take 1 tablet by mouth every 8 (eight) hours as needed for severe pain. Must last 30 days 90 tablet 0  . [START ON 09/29/2020] HYDROcodone-acetaminophen (NORCO/VICODIN) 5-325 MG tablet Take 1 tablet by mouth every 8 (eight) hours as needed for severe pain. Must last 30 days 90 tablet 0  . levocetirizine (XYZAL) 5 MG tablet Take 0.5 tablets (2.5 mg total) by mouth every evening. (Patient taking differently: Take 5 mg by mouth at bedtime as needed for allergies.) 45 tablet 3  . levothyroxine (SYNTHROID) 175 MCG tablet Take 1 tablet (175 mcg total) by mouth daily before breakfast. 90 tablet 1  . losartan-hydrochlorothiazide (HYZAAR) 50-12.5 MG tablet Take 1 tablet by mouth daily.    . metFORMIN (GLUCOPHAGE) 500 MG tablet Take 1 tablet (500 mg total) by mouth daily with breakfast. With food 90 tablet 3  . metoprolol succinate (TOPROL-XL) 50 MG 24 hr tablet TAKE 1 TABLET BY MOUTH DAILY (Patient taking differently: Take 50 mg by mouth daily.) 90 tablet 1  . Misc. Devices (TUB TRANSFER BOARD) MISC 1 Device by Does not apply route as needed. 1 each 0  . Multiple Vitamin (MULTIVITAMIN WITH MINERALS) TABS tablet Take 1 tablet by mouth daily.    . ondansetron (ZOFRAN) 4 MG tablet Take 1 tablet (4 mg total) by mouth every 8 (eight) hours as needed for nausea or vomiting. 90 tablet 1  . oxybutynin (DITROPAN XL) 15 MG 24 hr tablet Take 15 mg by  mouth at bedtime.    . pantoprazole (PROTONIX) 40 MG tablet Take 40 mg by mouth daily.     . pregabalin (LYRICA) 50 MG capsule Take 1 capsule (50 mg total) by mouth 3 (three) times daily. 90 capsule 0  . sitaGLIPtin (JANUVIA) 25 MG tablet Take 1 tablet (25 mg total) by mouth daily. 90 tablet 3  . sucralfate (CARAFATE) 1 GM/10ML suspension Take 1 g by mouth 3 (three) times daily.     Marland Kitchen thiamine 100 MG tablet Take 0.5 tablets (50 mg total) by mouth 2 (two) times daily with a meal. 60 tablet 2  . TOVIAZ 8 MG TB24 tablet TAKE ONE TABLET BY MOUTH EVERY DAY (Patient taking differently: Take 8 mg by mouth daily.) 90 tablet 3  . trospium (SANCTURA) 20 MG tablet Take 20 mg by mouth 2 (two) times daily.    . vitamin B-12 1000 MCG tablet Take 1 tablet (1,000 mcg total) by mouth daily. 30 tablet 2  . Cholecalciferol (VITAMIN D-3 PO) TAKE 1 TABLET BY MOUTH 2 TIMES DAILY WITH A MEAL (Patient not taking: No sig reported)    . MODERNA COVID-19 VACCINE 100 MCG/0.5ML injection     . rosuvastatin (CRESTOR) 20 MG tablet Take 1 tablet (20 mg total) by mouth daily. 30 tablet 5  . Thiamine HCl (VITAMIN B-1 PO) TAKE 1/2 TABLET BY MOUTH 2 TIMES DAILY WITH A MEAL (Patient not taking: No sig reported)     Current Facility-Administered Medications on File Prior to Visit  Medication Dose Route Frequency Provider Last Rate Last Admin  . ipratropium-albuterol (DUONEB)  0.5-2.5 (3) MG/3ML nebulizer solution 3 mL  3 mL Nebulization Q6H [name], [name], MD        There are no Patient Instructions on file for this visit. No follow-ups on file.   Kris Hartmann, NP

## 2020-08-12 ENCOUNTER — Other Ambulatory Visit: Payer: Self-pay | Admitting: Urology

## 2020-08-13 ENCOUNTER — Telehealth: Payer: Self-pay | Admitting: Internal Medicine

## 2020-08-13 NOTE — Progress Notes (Deleted)
08/14/2020 10:56 AM   [name] Apr 14, 1950 [id number]  Referring provider: [name], [name], MD [place],  [place] [id number]  No chief complaint on file.  Urological history: 1. Urinary incontinence -contributing factors of age, vaginal atrophy, obesity, narcotics, benzodiazepines, DM, sleep apnea and cystocele  2. Vaginal atrophy -managed with vaginal estrogen cream three nights weekly  3. Nephrolithiasis -Contrast CT 2021 5 mm nonobstructing stone in the left renal pelvis  4. Bilateral adrenal myolipomas -MRI performed on 12/14/2014 noted bilateral adrenal masses containing areas of macroscopic fat are compatible with benign adrenal myelolipoma. When compared with study from 08/23/2012 these demonstrate only minimal increase in size and are compatible with a benign process.  No further monitoring is needed.    -non-contrast CT 2019 Stable bilateral adrenal masses are noted with macroscopic fat present consistent with myelolipomas. The right lesion measures 7.6 cm, and the left lesion measures 9.8 cm. -contrast CT 2021 Large bilateral adrenal masses demonstrating heterogeneous attenuation with predominantly fat content measuring 8.2 x 8.6 cm on the left and 8.6 x 6.0 cm on the right.  HPI: [name] is a 70 y.o. female with incontinence who presents today for follow up.    PMH: Past Medical History:  Diagnosis Date  . Abnormal antibody titer   . Anxiety and depression   . Arthritis   . Asthma   . Cystocele   . Depression   . Diabetes ([place])   . DVT (deep venous thrombosis) (HCC)    Righ calf  . Dyspnea    11/08/2018 - "more now due to lack of exercise"  . Dysrhythmia    afib  . Edema   . GERD (gastroesophageal reflux disease)   . Heart murmur    Echocardiogram June 2015: Mild MR (possible vegetation seen on TTE, not seen on TEE), normal LV size with moderate concentric LVH. Normal function EF 60-65%. Normal diastolic function. Mild  LA dilation.  Marland Kitchen History of IBS   . History of kidney stones   . History of methicillin resistant staphylococcus aureus (MRSA) 2008  . Hyperlipidemia   . Hypertension   . Hypothyroidism   . Insomnia   . Kidney stone    kidney stones with lithotripsy .  [facility]- "adrenal glands"  . Neuropathy involving both lower extremities   . Obesity, Class II, BMI 35-39.9, with comorbidity   . Paroxysmal atrial fibrillation ([place]) 2007   In June 2015, Cardiac Event Monitor: Mostly SR/sinus arrhythmia with PVCs that are frequent. Short bursts of A. fib lasting several minutes;; CHA2DS2-VASc Score = 5 (age, Female, PVD, DM, HTN)  . Peripheral vascular disease ([place])   . Sleep apnea    use C-PAP  . Urinary incontinence   . Venous stasis dermatitis of both lower extremities     Surgical History: Past Surgical History:  Procedure Laterality Date  . ANKLE SURGERY Right   . APPLICATION OF WOUND VAC Left 06/27/2015   Procedure: APPLICATION OF WOUND VAC ( POSSIBLE ) ;  Surgeon: [name], MD;  Location: [facility] ORS;  Service: Vascular;  Laterality: Left;  . APPLICATION OF WOUND VAC Left 09/25/2016   Procedure: APPLICATION OF WOUND VAC;  Surgeon: [name], DPM;  Location: [facility];  Service: Podiatry;  Laterality: Left;  . arm surgery     right    . CHOLECYSTECTOMY    . COLONOSCOPY    . COLONOSCOPY WITH PROPOFOL N/A 01/11/2017   Procedure: COLONOSCOPY WITH PROPOFOL;  Surgeon:  [name], MD;  Location: [facility] ENDOSCOPY;  Service: Endoscopy;  Laterality: N/A;  . COLONOSCOPY WITH PROPOFOL N/A 02/22/2017   Procedure: COLONOSCOPY WITH PROPOFOL;  Surgeon: [name], MD;  Location: [facility] ENDOSCOPY;  Service: Endoscopy;  Laterality: N/A;  . COLONOSCOPY WITH PROPOFOL N/A 05/31/2017   Procedure: COLONOSCOPY WITH PROPOFOL;  Surgeon: [name], MD;  Location: [facility] ENDOSCOPY;  Service: Endoscopy;  Laterality: N/A;  . ESOPHAGOGASTRODUODENOSCOPY (EGD) WITH PROPOFOL N/A 01/11/2017    Procedure: ESOPHAGOGASTRODUODENOSCOPY (EGD) WITH PROPOFOL;  Surgeon: [name], MD;  Location: [facility] ENDOSCOPY;  Service: Endoscopy;  Laterality: N/A;  . ESOPHAGOGASTRODUODENOSCOPY (EGD) WITH PROPOFOL N/A 10/25/2018   Procedure: ESOPHAGOGASTRODUODENOSCOPY (EGD) WITH PROPOFOL;  Surgeon: [name], MD;  Location: [facility] ENDOSCOPY;  Service: Endoscopy;  Laterality: N/A;  . ESOPHAGOGASTRODUODENOSCOPY (EGD) WITH PROPOFOL N/A 11/29/2019   Procedure: ESOPHAGOGASTRODUODENOSCOPY (EGD) WITH PROPOFOL;  Surgeon: Toledo, Benay Pike, MD;  Location: [facility] ENDOSCOPY;  Service: Gastroenterology;  Laterality: N/A;  . HERNIA REPAIR     umbilical  . HIATAL HERNIA REPAIR    . I & D EXTREMITY Left 06/27/2015   Procedure: IRRIGATION AND DEBRIDEMENT EXTREMITY            ( CALF HEMATOMA ) POSSIBLE WOUND VAC;  Surgeon: [name], MD;  Location: [facility];  Service: Vascular;  Laterality: Left;  . INCISION AND DRAINAGE OF WOUND Left 09/25/2016   Procedure: IRRIGATION AND DEBRIDEMENT - PARTIAL RESECTION OF ACHILLES TENDON WITH WOUND VAC APPLICATION;  Surgeon: [name], DPM;  Location: [facility];  Service: Podiatry;  Laterality: Left;  . INCISION AND DRAINAGE OF WOUND Left 11/09/2018   Procedure: Excision of left foot wound with ACell placement;  Surgeon: Wallace Going, DO;  Location: [facility];  Service: Plastics;  Laterality: Left;  . IRRIGATION AND DEBRIDEMENT ABSCESS Left 09/07/2016   Procedure: IRRIGATION AND DEBRIDEMENT ABSCESS LEFT HEEL;  Surgeon: [name], DPM;  Location: [facility];  Service: Podiatry;  Laterality: Left;  . JOINT REPLACEMENT  2013   left knee replacement  . KIDNEY SURGERY Right    kidney stones  . LITHOTRIPSY    . LOWER EXTREMITY ANGIOGRAPHY Left 04/04/2018   Procedure: LOWER EXTREMITY ANGIOGRAPHY;  Surgeon: [name], MD;  Location: [facility];  Service: Cardiovascular;  Laterality: Left;  . NM GATED MYOVIEW ([place] [facility])  February 2017   Likely breast attenuation.  LOW RISK study. Normal EF 55-60%.  . OTHER SURGICAL HISTORY     08/2016 or 09/2016 surgery on achilles tenso h/o staph infection heal. Dr. [name]  . removal of left hematoma Left    leg  . REVERSE SHOULDER ARTHROPLASTY Right 10/08/2015   Procedure: REVERSE SHOULDER ARTHROPLASTY;  Surgeon: [name], MD;  Location: [facility];  Service: Orthopedics;  Laterality: Right;  . SLEEVE GASTROPLASTY    . TONSILLECTOMY    . TOTAL KNEE ARTHROPLASTY Left   . TOTAL SHOULDER REPLACEMENT Right   . TRANSESOPHAGEAL ECHOCARDIOGRAM  08/08/2013   Mild LVH, EF 60-65%. Moderate LA dilation and mild RA dilation. Mild MR with no evidence of stenosis and no evidence of endocarditis. A false chordae is noted.  . TRANSTHORACIC ECHOCARDIOGRAM  08/03/2013   Mild-Moderate concentric LVH, EF 60-65%. Normal diastolic function. Mild LA dilation. Mild MR with possible vegetation  - not confirmed on TEE   . ULNAR NERVE TRANSPOSITION Right 08/06/2016   Procedure: ULNAR NERVE DECOMPRESSION/TRANSPOSITION;  Surgeon: [name], MD;  Location: [facility];  Service: Orthopedics;  Laterality:  Right;  . UPPER GI ENDOSCOPY    . VAGINAL HYSTERECTOMY      Home Medications:  Allergies as of 08/14/2020      Reactions   Morphine And Related Other (See Comments), Nausea Only   Patient becomes very confused Other reaction(s): Other (See Comments), Other (See Comments) Patient becomes very confused Patient becomes very confused   Penicillins Hives, Shortness Of Breath, Swelling, Other (See Comments)   Facial swelling Has patient had a PCN reaction causing immediate rash, facial/tongue/throat swelling, SOB or lightheadedness with hypotension: Yes Has patient had a PCN reaction causing severe rash involving mucus membranes or skin necrosis: Yes Has patient had a PCN reaction that required hospitalization No Has patient had a PCN reaction occurring within the last 10 years: No If all of the above answers are "NO", then may proceed with  Cephalosporin use. Other reaction(s): Other (See Comments), Other (See Comments) Facial swelling Has patient had a PCN reaction causing immediate rash, facial/tongue/throat swelling, SOB or lightheadedness with hypotension: Yes Has patient had a PCN reaction causing severe rash involving mucus membranes or skin necrosis: Yes Has patient had a PCN reaction that required hospitalization No Has patient had a PCN reaction occurring within the last 10 years: No If all of the above answers are "NO", then may proceed with Cephalosporin use. Facial swelling Has patient had a PCN reaction causing immediate rash, facial/tongue/throat swelling, SOB or lightheadedness with hypotension: Yes Has patient had a PCN reaction causing severe rash involving mucus membranes or skin necrosis: Yes Has patient had a PCN reaction that required hospitalization No Has patient had a PCN reaction occurring within the last 10 years: No If all of the above answers are "NO", then may proceed with Cephalosporin use. Facial swelling Has patient had a PCN reaction causing immediate rash, facial/tongue/throat swelling, SOB or lightheadedness with hypotension: Yes Has patient had a PCN reaction causing severe rash involving mucus membranes or skin necrosis: Yes Has patient had a PCN reaction that required hospitalization No Has patient had a PCN reaction occurring within the last 10 years: No If all of the above answers are "NO", then may proceed with Cephalosporin use.   Ambien [zolpidem]    Hallucination    Aspirin Hives   Oxytetracycline Hives   Other reaction(s): Unknown      Medication List       Accurate as of Aug 13, 2020 10:56 AM. If you have any questions, ask your nurse or doctor.        acetaminophen 325 MG tablet Commonly known as: TYLENOL Take 2 tablets (650 mg total) by mouth every 6 (six) hours as needed for mild pain or moderate pain (or Fever >/= 101).   Advair HFA 115-21 MCG/ACT inhaler Generic  drug: fluticasone-salmeterol Inhale 2 puffs into the lungs daily. Rinse mouth thoroughly after use   albuterol (2.5 MG/3ML) 0.083% nebulizer solution Commonly known as: PROVENTIL Take 2.5 mg by nebulization every 4 (four) hours as needed for wheezing or shortness of breath.   ALPRAZolam 0.5 MG tablet Commonly known as: XANAX TAKE 1/2 TABLET BY MOUTH TWICE DAILY AS NEEDED FOR ANXIETY   apixaban 5 MG Tabs tablet Commonly known as: ELIQUIS Take 1 tablet (5 mg total) by mouth 2 (two) times daily.   buPROPion 150 MG 24 hr tablet Commonly known as: WELLBUTRIN XL Take 1 tablet (150 mg total) by mouth daily.   calcium-vitamin D 500-200 MG-UNIT tablet Commonly known as: OSCAL WITH D Take 1 tablet by mouth  2 (two) times daily with a meal.   clobetasol cream 0.05 % Commonly known as: TEMOVATE Apply 1 application topically 2 (two) times daily as needed (skin irritation).   colchicine 0.6 MG tablet Take 1 tablet (0.6 mg total) by mouth as needed. May repeat 1 pill in 1 hr. No more than 3 total pills 1.8 mg in 24 hours as needed gout flare   cyanocobalamin 1000 MCG tablet Take 1 tablet (1,000 mcg total) by mouth daily.   diclofenac Sodium 1 % Gel Commonly known as: VOLTAREN Apply 2 g topically 3 (three) times daily.   DULoxetine 60 MG capsule Commonly known as: CYMBALTA Take 1 capsule (60 mg total) by mouth daily.   ergocalciferol 1.25 MG (50000 UT) capsule Commonly known as: VITAMIN D2 Take 50,000 Units by mouth once a week.   estradiol 0.1 MG/GM vaginal cream Commonly known as: ESTRACE Place 1 Applicatorful vaginally every Monday, Wednesday, and Friday. Apply 0.5mg  (pea-sized amount)  just inside the vaginal introitus with a finger-tip on Monday, Wednesday and Friday nights,   folic acid 1 MG tablet Commonly known as: FOLVITE Take 1 tablet (1 mg total) by mouth daily.   HYDROcodone-acetaminophen 5-325 MG tablet Commonly known as: NORCO/VICODIN Take 1 tablet by mouth every 8  (eight) hours as needed for severe pain. Must last 30 days   HYDROcodone-acetaminophen 5-325 MG tablet Commonly known as: NORCO/VICODIN Take 1 tablet by mouth every 8 (eight) hours as needed for severe pain. Must last 30 days Start taking on: August 30, 2020   HYDROcodone-acetaminophen 5-325 MG tablet Commonly known as: NORCO/VICODIN Take 1 tablet by mouth every 8 (eight) hours as needed for severe pain. Must last 30 days Start taking on: September 29, 2020   levocetirizine 5 MG tablet Commonly known as: XYZAL Take 0.5 tablets (2.5 mg total) by mouth every evening. What changed:   how much to take  when to take this  reasons to take this   levothyroxine 175 MCG tablet Commonly known as: SYNTHROID Take 1 tablet (175 mcg total) by mouth daily before breakfast.   losartan-hydrochlorothiazide 50-12.5 MG tablet Commonly known as: HYZAAR Take 1 tablet by mouth daily.   metFORMIN 500 MG tablet Commonly known as: GLUCOPHAGE Take 1 tablet (500 mg total) by mouth daily with breakfast. With food   metoprolol succinate 50 MG 24 hr tablet Commonly known as: TOPROL-XL TAKE 1 TABLET BY MOUTH DAILY What changed:   how much to take  how to take this  when to take this  additional instructions   Moderna COVID-19 Vaccine 100 MCG/0.5ML injection Generic drug: COVID-19 mRNA vaccine (Moderna)   montelukast 10 MG tablet Commonly known as: SINGULAIR TAKE 1 TABLET BY MOUTH AT BEDTIME   multivitamin with minerals Tabs tablet Take 1 tablet by mouth daily.   ondansetron 4 MG tablet Commonly known as: Zofran Take 1 tablet (4 mg total) by mouth every 8 (eight) hours as needed for nausea or vomiting.   oxybutynin 15 MG 24 hr tablet Commonly known as: DITROPAN XL Take 15 mg by mouth at bedtime.   pantoprazole 40 MG tablet Commonly known as: PROTONIX Take 40 mg by mouth daily.   pregabalin 50 MG capsule Commonly known as: LYRICA Take 1 capsule (50 mg total) by mouth 3 (three) times  daily.   rosuvastatin 20 MG tablet Commonly known as: CRESTOR Take 1 tablet (20 mg total) by mouth daily.   sitaGLIPtin 25 MG tablet Commonly known as: Januvia Take 1 tablet (25 mg  total) by mouth daily.   sucralfate 1 GM/10ML suspension Commonly known as: CARAFATE Take 1 g by mouth 3 (three) times daily.   thiamine 100 MG tablet Take 0.5 tablets (50 mg total) by mouth 2 (two) times daily with a meal.   Toviaz 8 MG Tb24 tablet Generic drug: fesoterodine TAKE ONE TABLET BY MOUTH EVERY DAY What changed: how much to take   trospium 20 MG tablet Commonly known as: SANCTURA Take 20 mg by mouth 2 (two) times daily.   Tub Transfer Board Misc 1 Device by Does not apply route as needed.   VITAMIN B-1 PO TAKE 1/2 TABLET BY MOUTH 2 TIMES DAILY WITH A MEAL   Vitamin D3 25 MCG tablet Commonly known as: Vitamin D Take 1 tablet (1,000 Units total) by mouth 2 (two) times daily with a meal.   VITAMIN D-3 PO TAKE 1 TABLET BY MOUTH 2 TIMES DAILY WITH A MEAL       Allergies:  Allergies  Allergen Reactions  . Morphine And Related Other (See Comments) and Nausea Only    Patient becomes very confused Other reaction(s): Other (See Comments), Other (See Comments) Patient becomes very confused Patient becomes very confused   . Penicillins Hives, Shortness Of Breath, Swelling and Other (See Comments)    Facial swelling Has patient had a PCN reaction causing immediate rash, facial/tongue/throat swelling, SOB or lightheadedness with hypotension: Yes Has patient had a PCN reaction causing severe rash involving mucus membranes or skin necrosis: Yes Has patient had a PCN reaction that required hospitalization No Has patient had a PCN reaction occurring within the last 10 years: No If all of the above answers are "NO", then may proceed with Cephalosporin use.  Other reaction(s): Other (See Comments), Other (See Comments) Facial swelling Has patient had a PCN reaction causing immediate  rash, facial/tongue/throat swelling, SOB or lightheadedness with hypotension: Yes Has patient had a PCN reaction causing severe rash involving mucus membranes or skin necrosis: Yes Has patient had a PCN reaction that required hospitalization No Has patient had a PCN reaction occurring within the last 10 years: No If all of the above answers are "NO", then may proceed with Cephalosporin use. Facial swelling Has patient had a PCN reaction causing immediate rash, facial/tongue/throat swelling, SOB or lightheadedness with hypotension: Yes Has patient had a PCN reaction causing severe rash involving mucus membranes or skin necrosis: Yes Has patient had a PCN reaction that required hospitalization No Has patient had a PCN reaction occurring within the last 10 years: No If all of the above answers are "NO", then may proceed with Cephalosporin use. Facial swelling Has patient had a PCN reaction causing immediate rash, facial/tongue/throat swelling, SOB or lightheadedness with hypotension: Yes Has patient had a PCN reaction causing severe rash involving mucus membranes or skin necrosis: Yes Has patient had a PCN reaction that required hospitalization No Has patient had a PCN reaction occurring within the last 10 years: No If all of the above answers are "NO", then may proceed with Cephalosporin use.   . Ambien [Zolpidem]     Hallucination   . Aspirin Hives  . Oxytetracycline Hives    Other reaction(s): Unknown     Family History: Family History  Problem Relation Age of Onset  . Skin cancer Father   . Diabetes Father   . Hypertension Father   . Peripheral vascular disease Father   . Cancer Father   . Cerebral aneurysm Father   . Alcohol abuse Father   .  Varicose Veins Mother   . Kidney disease Mother   . Arthritis Mother   . Mental illness Sister   . Cancer Maternal Aunt        breast  . Breast cancer Maternal Aunt   . Arthritis Maternal Grandmother   . Hypertension Maternal  Grandmother   . Diabetes Maternal Grandmother   . Arthritis Maternal Grandfather   . Heart disease Maternal Grandfather   . Hypertension Maternal Grandfather   . Arthritis Paternal Grandmother   . Hypertension Paternal Grandmother   . Diabetes Paternal Grandmother   . Arthritis Paternal Grandfather   . Hypertension Paternal Grandfather   . Bladder Cancer Neg Hx   . Kidney cancer Neg Hx     Social History:  reports that she has never smoked. She has never used smokeless tobacco. She reports that she does not drink alcohol and does not use drugs.  ROS: Pertinent ROS in HPI  Physical Exam: There were no vitals taken for this visit.  Constitutional:  Well nourished. Alert and oriented, No acute distress. HEENT: [place] AT, moist mucus membranes.  Trachea midline, no masses. Cardiovascular: No clubbing, cyanosis, or edema. Respiratory: Normal respiratory effort, no increased work of breathing. GI: Abdomen is soft, non tender, non distended, no abdominal masses. Liver and spleen not palpable.  No hernias appreciated.  Stool sample for occult testing is not indicated.   GU: No CVA tenderness.  No bladder fullness or masses.  *** external genitalia, *** pubic hair distribution, no lesions.  Normal urethral meatus, no lesions, no prolapse, no discharge.   No urethral masses, tenderness and/or tenderness. No bladder fullness, tenderness or masses. *** vagina mucosa, *** estrogen effect, no discharge, no lesions, *** pelvic support, *** cystocele and *** rectocele noted.  No cervical motion tenderness.  Uterus is freely mobile and non-fixed.  No adnexal/parametria masses or tenderness noted.  Anus and perineum are without rashes or lesions.   ***  Skin: No rashes, bruises or suspicious lesions. Lymph: No cervical or inguinal adenopathy. Neurologic: Grossly intact, no focal deficits, moving all 4 extremities. Psychiatric: Normal mood and affect.   Laboratory Data: Lab Results  Component Value Date    WBC 6.9 04/13/2020   HGB 10.3 (L) 04/13/2020   HCT 33.1 (L) 04/13/2020   MCV 85.5 04/13/2020   PLT 227 04/13/2020    Lab Results  Component Value Date   CREATININE 0.86 04/14/2020    Lab Results  Component Value Date   HGBA1C 6.7 (H) 04/07/2020    Lab Results  Component Value Date   TSH 0.939 02/06/2020       Component Value Date/Time   CHOL 65 04/07/2020 0500   CHOL 185 10/10/2014 0822   HDL 46 04/07/2020 0500   HDL 94 10/10/2014 0822   CHOLHDL 1.4 04/07/2020 0500   VLDL 10 04/07/2020 0500   LDLCALC 9 04/07/2020 0500   LDLCALC 73 10/10/2014 0822    Lab Results  Component Value Date   AST 18 04/05/2020   Lab Results  Component Value Date   ALT 14 04/05/2020   Urinalysis    Component Value Date/Time   COLORURINE YELLOW (A) 04/07/2020 1015   APPEARANCEUR Clear 05/23/2020 1540   LABSPEC 1.008 04/07/2020 1015   PHURINE 5.0 04/07/2020 1015   GLUCOSEU Negative 05/23/2020 1540   GLUCOSEU NEGATIVE 08/02/2017 1427   HGBUR NEGATIVE 04/07/2020 1015   BILIRUBINUR Negative 05/23/2020 1540   BILIRUBINUR 1+ 09/15/2017 [place] 04/07/2020 1015   PROTEINUR  Trace 05/23/2020 1540   PROTEINUR NEGATIVE 04/07/2020 1015   UROBILINOGEN 0.2 09/15/2017 1319   UROBILINOGEN 0.2 08/02/2017 1427   NITRITE Negative 05/23/2020 1540   NITRITE NEGATIVE 04/07/2020 1015   LEUKOCYTESUR Negative 05/23/2020 1540   LEUKOCYTESUR NEGATIVE 04/07/2020 1015  I have reviewed the labs.   Pertinent Imaging: No recent imaging  Assessment & Plan:  ***  1. Incontinence ***  No follow-ups on file.  These notes generated with voice recognition software. I apologize for typographical errors.  Zara Council, PA-C  [facility] [address]  [place], [place] [address] [phone]

## 2020-08-13 NOTE — Telephone Encounter (Signed)
Pt husband would like a call to go over pt medications and the time she need to take it. Please advise Thank you!  Call pt husband at [phone].

## 2020-08-14 ENCOUNTER — Telehealth: Payer: Self-pay | Admitting: Internal Medicine

## 2020-08-14 ENCOUNTER — Ambulatory Visit: Payer: Self-pay | Admitting: Urology

## 2020-08-14 DIAGNOSIS — I69319 Unspecified symptoms and signs involving cognitive functions following cerebral infarction: Secondary | ICD-10-CM | POA: Diagnosis not present

## 2020-08-14 DIAGNOSIS — L97422 Non-pressure chronic ulcer of left heel and midfoot with fat layer exposed: Secondary | ICD-10-CM | POA: Diagnosis not present

## 2020-08-14 DIAGNOSIS — I152 Hypertension secondary to endocrine disorders: Secondary | ICD-10-CM | POA: Diagnosis not present

## 2020-08-14 DIAGNOSIS — E1151 Type 2 diabetes mellitus with diabetic peripheral angiopathy without gangrene: Secondary | ICD-10-CM | POA: Diagnosis not present

## 2020-08-14 DIAGNOSIS — L97522 Non-pressure chronic ulcer of other part of left foot with fat layer exposed: Secondary | ICD-10-CM | POA: Diagnosis not present

## 2020-08-14 DIAGNOSIS — I4819 Other persistent atrial fibrillation: Secondary | ICD-10-CM | POA: Diagnosis not present

## 2020-08-14 DIAGNOSIS — E1159 Type 2 diabetes mellitus with other circulatory complications: Secondary | ICD-10-CM | POA: Diagnosis not present

## 2020-08-14 DIAGNOSIS — N3946 Mixed incontinence: Secondary | ICD-10-CM

## 2020-08-14 DIAGNOSIS — I872 Venous insufficiency (chronic) (peripheral): Secondary | ICD-10-CM | POA: Diagnosis not present

## 2020-08-14 DIAGNOSIS — E1142 Type 2 diabetes mellitus with diabetic polyneuropathy: Secondary | ICD-10-CM | POA: Diagnosis not present

## 2020-08-14 DIAGNOSIS — E11621 Type 2 diabetes mellitus with foot ulcer: Secondary | ICD-10-CM | POA: Diagnosis not present

## 2020-08-14 NOTE — Telephone Encounter (Signed)
Patient needs the following medications refilled; pregabalin (LYRICA) 50 MG capsule, TOVIAZ 8 MG TB24 tablet, oxybutynin (DITROPAN XL) 15 MG 24 hr tablet. Pain Management told patient that her provider had to refill these  Mediations they would only refill pain madication for now on.

## 2020-08-14 NOTE — Telephone Encounter (Signed)
See 08/14/20 telephone encounter

## 2020-08-15 ENCOUNTER — Telehealth: Payer: Self-pay | Admitting: Internal Medicine

## 2020-08-15 NOTE — Telephone Encounter (Signed)
Kelli from [facility] called to request orders to be put in for occupational and physical therapy. Advise them to fax them in but they wanted permission first to send them in to have authorization.

## 2020-08-15 NOTE — Telephone Encounter (Signed)
Please advise 

## 2020-08-15 NOTE — Telephone Encounter (Signed)
Verbal ok PTOT  Thanks Call back

## 2020-08-16 ENCOUNTER — Encounter: Payer: Medicare HMO | Admitting: Internal Medicine

## 2020-08-16 ENCOUNTER — Other Ambulatory Visit: Payer: Self-pay | Admitting: Internal Medicine

## 2020-08-16 ENCOUNTER — Other Ambulatory Visit: Payer: Self-pay

## 2020-08-16 DIAGNOSIS — I10 Essential (primary) hypertension: Secondary | ICD-10-CM | POA: Diagnosis not present

## 2020-08-16 DIAGNOSIS — L97322 Non-pressure chronic ulcer of left ankle with fat layer exposed: Secondary | ICD-10-CM | POA: Diagnosis not present

## 2020-08-16 DIAGNOSIS — I152 Hypertension secondary to endocrine disorders: Secondary | ICD-10-CM | POA: Diagnosis not present

## 2020-08-16 DIAGNOSIS — E11621 Type 2 diabetes mellitus with foot ulcer: Secondary | ICD-10-CM | POA: Diagnosis not present

## 2020-08-16 DIAGNOSIS — E1159 Type 2 diabetes mellitus with other circulatory complications: Secondary | ICD-10-CM | POA: Diagnosis not present

## 2020-08-16 DIAGNOSIS — L97422 Non-pressure chronic ulcer of left heel and midfoot with fat layer exposed: Secondary | ICD-10-CM | POA: Diagnosis not present

## 2020-08-16 DIAGNOSIS — E1151 Type 2 diabetes mellitus with diabetic peripheral angiopathy without gangrene: Secondary | ICD-10-CM | POA: Diagnosis not present

## 2020-08-16 DIAGNOSIS — I4819 Other persistent atrial fibrillation: Secondary | ICD-10-CM | POA: Diagnosis not present

## 2020-08-16 DIAGNOSIS — E1140 Type 2 diabetes mellitus with diabetic neuropathy, unspecified: Secondary | ICD-10-CM | POA: Diagnosis not present

## 2020-08-16 DIAGNOSIS — I69319 Unspecified symptoms and signs involving cognitive functions following cerebral infarction: Secondary | ICD-10-CM | POA: Diagnosis not present

## 2020-08-16 DIAGNOSIS — L97522 Non-pressure chronic ulcer of other part of left foot with fat layer exposed: Secondary | ICD-10-CM | POA: Diagnosis not present

## 2020-08-16 DIAGNOSIS — L97812 Non-pressure chronic ulcer of other part of right lower leg with fat layer exposed: Secondary | ICD-10-CM | POA: Diagnosis not present

## 2020-08-16 DIAGNOSIS — E1142 Type 2 diabetes mellitus with diabetic polyneuropathy: Secondary | ICD-10-CM | POA: Diagnosis not present

## 2020-08-16 DIAGNOSIS — Z7901 Long term (current) use of anticoagulants: Secondary | ICD-10-CM | POA: Diagnosis not present

## 2020-08-16 DIAGNOSIS — I872 Venous insufficiency (chronic) (peripheral): Secondary | ICD-10-CM | POA: Diagnosis not present

## 2020-08-16 DIAGNOSIS — Z86718 Personal history of other venous thrombosis and embolism: Secondary | ICD-10-CM | POA: Diagnosis not present

## 2020-08-16 DIAGNOSIS — L97429 Non-pressure chronic ulcer of left heel and midfoot with unspecified severity: Secondary | ICD-10-CM | POA: Diagnosis not present

## 2020-08-16 DIAGNOSIS — M797 Fibromyalgia: Secondary | ICD-10-CM

## 2020-08-16 MED ORDER — PREGABALIN 50 MG PO CAPS
50.0000 mg | ORAL_CAPSULE | Freq: Three times a day (TID) | ORAL | 2 refills | Status: DC
Start: 2020-08-16 — End: 2020-10-11

## 2020-08-16 NOTE — Telephone Encounter (Signed)
Pt states that she has been out of her lyrica for two weeks now and she wants to check on this. She states that she is in pain from her neuropathy.  Please advise.

## 2020-08-16 NOTE — Telephone Encounter (Signed)
Please advise, pain management wanting PCP to take over

## 2020-08-16 NOTE — Telephone Encounter (Signed)
Verbal given 

## 2020-08-16 NOTE — Telephone Encounter (Signed)
Toviaz and ditropan are from urology  Sent lyrica

## 2020-08-16 NOTE — Telephone Encounter (Signed)
Left message to return call 

## 2020-08-19 NOTE — Progress Notes (Signed)
08/20/2020 2:01 PM   [name] January 09, 1951 [id number]  Referring provider: [name], [name], MD [place],  [place] [address]  Chief Complaint  Patient presents with  . Urinary Incontinence   Urological history: 1. Urinary incontinence -contributing factors of age, vaginal atrophy, obesity, narcotics, benzodiazepines, DM, sleep apnea and cystocele  2. Vaginal atrophy -managed with vaginal estrogen cream three nights weekly  3. Nephrolithiasis -Contrast CT 2021 5 mm nonobstructing stone in the left renal pelvis  4. Bilateral adrenal myolipomas -MRI performed on 12/14/2014 noted bilateral adrenal masses containing areas of macroscopic fat are compatible with benign adrenal myelolipoma. When compared with study from 08/23/2012 these demonstrate only minimal increase in size and are compatible with a benign process.  No further monitoring is needed.    -non-contrast CT 2019 Stable bilateral adrenal masses are noted with macroscopic fat present consistent with myelolipomas. The right lesion measures 7.6 cm, and the left lesion measures 9.8 cm. -contrast CT 2021 Large bilateral adrenal masses demonstrating heterogeneous attenuation with predominantly fat content measuring 8.2 x 8.6 cm on the left and 8.6 x 6.0 cm on the right.  HPI: [name] is a 70 y.o. female with incontinence who presents today for follow up.   She is experiencing 8 or more daytime leakages, 1-2 nocturia, strong urge to urinate, she leaks urine when she laughs coughs sneezes etc. and she loses urine when she cannot make it to the restroom on time.  She has 3 more daily urinary leakages.  She always wears her pads 6-8 daily.  She soaks the bed nightly with urine.   She states last Friday, she was having excruciating pain with urination.  It feels like something is pulling her urethra.  She got OTC miconazole for the last seven days and feels fetter.    Patient denies any modifying  or aggravating factors.  Patient denies any gross hematuria, dysuria or suprapubic/flank pain.  Patient denies any fevers, chills, nausea or vomiting.   PVR 0 mL.    She is sleeping with CPAP nightly.  Her husband feels that the CPAP machine may not be working correctly, so he is going to take it to be calibrated.  She is taking Toviaz 8 mg in the morning and oxybutynin XL 15 mg daily.    She feels that her incontinence has worsened over the last several months.  She states the Lisbeth Ply helps her during the day feel the urge to urinate, she does not take it she states she does not feel the urge to urinate.  She states oxybutynin is not helping with the nighttime incontinence anymore as she is leaking through her protective supplies.  PMH: Past Medical History:  Diagnosis Date  . Abnormal antibody titer   . Anxiety and depression   . Arthritis   . Asthma   . Cystocele   . Depression   . Diabetes ([place])   . DVT (deep venous thrombosis) (H[facility])    Righ calf  . Dyspnea    11/08/2018 - "more now due to lack of exercise"  . Dysrhythmia    afib  . Edema   . GERD (gastroesophageal reflux disease)   . Heart murmur    Echocardiogram June 2015: Mild MR (possible vegetation seen on TTE, not seen on TEE), normal LV size with moderate concentric LVH. Normal function EF 60-65%. Normal diastolic function. Mild LA dilation.  Marland Kitchen History of IBS   . History of kidney stones   .  History of methicillin resistant staphylococcus aureus (MRSA) 2008  . Hyperlipidemia   . Hypertension   . Hypothyroidism   . Insomnia   . Kidney stone    kidney stones with lithotripsy .  Rosalia Kidney- "adrenal glands"  . Neuropathy involving both lower extremities   . Obesity, Class II, BMI 35-39.9, with comorbidity   . Paroxysmal atrial fibrillation ([place]) 2007   In June 2015, Cardiac Event Monitor: Mostly SR/sinus arrhythmia with PVCs that are frequent. Short bursts of A. fib lasting several minutes;; CHA2DS2-VASc Score  = 5 (age, Female, PVD, DM, HTN)  . Peripheral vascular disease ([place])   . Sleep apnea    use C-PAP  . Urinary incontinence   . Venous stasis dermatitis of both lower extremities     Surgical History: Past Surgical History:  Procedure Laterality Date  . ANKLE SURGERY Right   . APPLICATION OF WOUND VAC Left 06/27/2015   Procedure: APPLICATION OF WOUND VAC ( POSSIBLE ) ;  Surgeon: [name], MD;  Location: [facility] ORS;  Service: Vascular;  Laterality: Left;  . APPLICATION OF WOUND VAC Left 09/25/2016   Procedure: APPLICATION OF WOUND VAC;  Surgeon: [name], DPM;  Location: [facility];  Service: Podiatry;  Laterality: Left;  . arm surgery     right    . CHOLECYSTECTOMY    . COLONOSCOPY    . COLONOSCOPY WITH PROPOFOL N/A 01/11/2017   Procedure: COLONOSCOPY WITH PROPOFOL;  Surgeon: [name], MD;  Location: [facility] ENDOSCOPY;  Service: Endoscopy;  Laterality: N/A;  . COLONOSCOPY WITH PROPOFOL N/A 02/22/2017   Procedure: COLONOSCOPY WITH PROPOFOL;  Surgeon: [name], MD;  [facility] ENDOSCOPY;  Service: Endoscopy;  Laterality: N/A;  . COLONOSCOPY WITH PROPOFOL N/A 05/31/2017   Procedure: COLONOSCOPY WITH PROPOFOL;  Surgeon: [name], MD;  Location: [facility] ENDOSCOPY;  Service: Endoscopy;  Laterality: N/A;  . ESOPHAGOGASTRODUODENOSCOPY (EGD) WITH PROPOFOL N/A 01/11/2017   Procedure: ESOPHAGOGASTRODUODENOSCOPY (EGD) WITH PROPOFOL;  Surgeon: [name], MD;  Location: [facility] ENDOSCOPY;  Service: Endoscopy;  Laterality: N/A;  . ESOPHAGOGASTRODUODENOSCOPY (EGD) WITH PROPOFOL N/A 10/25/2018   Procedure: ESOPHAGOGASTRODUODENOSCOPY (EGD) WITH PROPOFOL;  Surgeon: [name], MD;  Location: [facility] ENDOSCOPY;  Service: Endoscopy;  Laterality: N/A;  . ESOPHAGOGASTRODUODENOSCOPY (EGD) WITH PROPOFOL N/A 11/29/2019   Procedure: ESOPHAGOGASTRODUODENOSCOPY (EGD) WITH PROPOFOL;  Surgeon: Toledo, Benay Pike, MD;  Location: [facility] ENDOSCOPY;  Service: Gastroenterology;  Laterality:  N/A;  . HERNIA REPAIR     umbilical  . HIATAL HERNIA REPAIR    . I & D EXTREMITY Left 06/27/2015   Procedure: IRRIGATION AND DEBRIDEMENT EXTREMITY            ( CALF HEMATOMA ) POSSIBLE WOUND VAC;  Surgeon: [name], MD;  Location: [facility] ORS;  Service: Vascular;  Laterality: Left;  . INCISION AND DRAINAGE OF WOUND Left 09/25/2016   Procedure: IRRIGATION AND DEBRIDEMENT - PARTIAL RESECTION OF ACHILLES TENDON WITH WOUND VAC APPLICATION;  Surgeon: [name], DPM;  Location: [facility];  Service: Podiatry;  Laterality: Left;  . INCISION AND DRAINAGE OF WOUND Left 11/09/2018   Procedure: Excision of left foot wound with ACell placement;  Surgeon: Wallace Going, DO;  Location: [place];  Service: Plastics;  Laterality: Left;  . IRRIGATION AND DEBRIDEMENT ABSCESS Left 09/07/2016   Procedure: IRRIGATION AND DEBRIDEMENT ABSCESS LEFT HEEL;  Surgeon: [name], DPM;  Location: [facility];  Service: Podiatry;  Laterality: Left;  . JOINT REPLACEMENT  2013   left knee replacement  .  KIDNEY SURGERY Right    kidney stones  . LITHOTRIPSY    . LOWER EXTREMITY ANGIOGRAPHY Left 04/04/2018   Procedure: LOWER EXTREMITY ANGIOGRAPHY;  Surgeon: [name], MD;  Location: [facility] [facility];  Service: Cardiovascular;  Laterality: Left;  . NM GATED MYOVIEW ([place] [facility])  February 2017   Likely breast attenuation. LOW RISK study. Normal EF 55-60%.  . OTHER SURGICAL HISTORY     08/2016 or 09/2016 surgery on achilles tenso h/o staph infection heal. Dr. [name]  . removal of left hematoma Left    leg  . REVERSE SHOULDER ARTHROPLASTY Right 10/08/2015   Procedure: REVERSE SHOULDER ARTHROPLASTY;  Surgeon: [name], MD;  Location: [facility];  Service: Orthopedics;  Laterality: Right;  . SLEEVE GASTROPLASTY    . TONSILLECTOMY    . TOTAL KNEE ARTHROPLASTY Left   . TOTAL SHOULDER REPLACEMENT Right   . TRANSESOPHAGEAL ECHOCARDIOGRAM  08/08/2013   Mild LVH, EF 60-65%. Moderate LA dilation and mild RA dilation. Mild  MR with no evidence of stenosis and no evidence of endocarditis. A false chordae is noted.  . TRANSTHORACIC ECHOCARDIOGRAM  08/03/2013   Mild-Moderate concentric LVH, EF 60-65%. Normal diastolic function. Mild LA dilation. Mild MR with possible vegetation  - not confirmed on TEE   . ULNAR NERVE TRANSPOSITION Right 08/06/2016   Procedure: ULNAR NERVE DECOMPRESSION/TRANSPOSITION;  Surgeon: [name], MD;  Location: [facility]S;  Service: Orthopedics;  Laterality: Right;  . UPPER GI ENDOSCOPY    . VAGINAL HYSTERECTOMY      Home Medications:  Allergies as of 08/20/2020      Reactions   Morphine And Related Other (See Comments), Nausea Only   Patient becomes very confused Other reaction(s): Other (See Comments), Other (See Comments) Patient becomes very confused Patient becomes very confused   Penicillins Hives, Shortness Of Breath, Swelling, Other (See Comments)   Facial swelling Has patient had a PCN reaction causing immediate rash, facial/tongue/throat swelling, SOB or lightheadedness with hypotension: Yes Has patient had a PCN reaction causing severe rash involving mucus membranes or skin necrosis: Yes Has patient had a PCN reaction that required hospitalization No Has patient had a PCN reaction occurring within the last 10 years: No If all of the above answers are "NO", then may proceed with Cephalosporin use. Other reaction(s): Other (See Comments), Other (See Comments) Facial swelling Has patient had a PCN reaction causing immediate rash, facial/tongue/throat swelling, SOB or lightheadedness with hypotension: Yes Has patient had a PCN reaction causing severe rash involving mucus membranes or skin necrosis: Yes Has patient had a PCN reaction that required hospitalization No Has patient had a PCN reaction occurring within the last 10 years: No If all of the above answers are "NO", then may proceed with Cephalosporin use. Facial swelling Has patient had a PCN reaction causing immediate  rash, facial/tongue/throat swelling, SOB or lightheadedness with hypotension: Yes Has patient had a PCN reaction causing severe rash involving mucus membranes or skin necrosis: Yes Has patient had a PCN reaction that required hospitalization No Has patient had a PCN reaction occurring within the last 10 years: No If all of the above answers are "NO", then may proceed with Cephalosporin use. Facial swelling Has patient had a PCN reaction causing immediate rash, facial/tongue/throat swelling, SOB or lightheadedness with hypotension: Yes Has patient had a PCN reaction causing severe rash involving mucus membranes or skin necrosis: Yes Has patient had a PCN reaction that required hospitalization No Has patient had a PCN reaction occurring  within the last 10 years: No If all of the above answers are "NO", then may proceed with Cephalosporin use.   Ambien [zolpidem]    Hallucination    Aspirin Hives   Oxytetracycline Hives   Other reaction(s): Unknown      Medication List       Accurate as of Aug 20, 2020  2:01 PM. If you have any questions, ask your nurse or doctor.        STOP taking these medications   trospium 20 MG tablet Commonly known as: SANCTURA Stopped by: Zara Council, PA-C     TAKE these medications   acetaminophen 325 MG tablet Commonly known as: TYLENOL Take 2 tablets (650 mg total) by mouth every 6 (six) hours as needed for mild pain or moderate pain (or Fever >/= 101).   Advair HFA 115-21 MCG/ACT inhaler Generic drug: fluticasone-salmeterol Inhale 2 puffs into the lungs daily. Rinse mouth thoroughly after use   albuterol (2.5 MG/3ML) 0.083% nebulizer solution Commonly known as: PROVENTIL Take 2.5 mg by nebulization every 4 (four) hours as needed for wheezing or shortness of breath.   ALPRAZolam 0.5 MG tablet Commonly known as: XANAX TAKE 1/2 TABLET BY MOUTH TWICE DAILY AS NEEDED FOR ANXIETY   apixaban 5 MG Tabs tablet Commonly known as: ELIQUIS Take 1  tablet (5 mg total) by mouth 2 (two) times daily.   buPROPion 150 MG 24 hr tablet Commonly known as: WELLBUTRIN XL Take 1 tablet (150 mg total) by mouth daily.   calcium-vitamin D 500-200 MG-UNIT tablet Commonly known as: OSCAL WITH D Take 1 tablet by mouth 2 (two) times daily with a meal.   clobetasol cream 0.05 % Commonly known as: TEMOVATE Apply 1 application topically 2 (two) times daily as needed (skin irritation).   colchicine 0.6 MG tablet Take 1 tablet (0.6 mg total) by mouth as needed. May repeat 1 pill in 1 hr. No more than 3 total pills 1.8 mg in 24 hours as needed gout flare   cyanocobalamin 1000 MCG tablet Take 1 tablet (1,000 mcg total) by mouth daily.   diclofenac Sodium 1 % Gel Commonly known as: VOLTAREN Apply 2 g topically 3 (three) times daily.   DULoxetine 60 MG capsule Commonly known as: CYMBALTA Take 1 capsule (60 mg total) by mouth daily.   ergocalciferol 1.25 MG (50000 UT) capsule Commonly known as: VITAMIN D2 Take 50,000 Units by mouth once a week.   estradiol 0.1 MG/GM vaginal cream Commonly known as: ESTRACE Place 1 Applicatorful vaginally every Monday, Wednesday, and Friday. Apply 0.5mg  (pea-sized amount)  just inside the vaginal introitus with a finger-tip on Monday, Wednesday and Friday nights,   folic acid 1 MG tablet Commonly known as: FOLVITE Take 1 tablet (1 mg total) by mouth daily.   HYDROcodone-acetaminophen 5-325 MG tablet Commonly known as: NORCO/VICODIN Take 1 tablet by mouth every 8 (eight) hours as needed for severe pain. Must last 30 days   HYDROcodone-acetaminophen 5-325 MG tablet Commonly known as: NORCO/VICODIN Take 1 tablet by mouth every 8 (eight) hours as needed for severe pain. Must last 30 days Start taking on: August 30, 2020   HYDROcodone-acetaminophen 5-325 MG tablet Commonly known as: NORCO/VICODIN Take 1 tablet by mouth every 8 (eight) hours as needed for severe pain. Must last 30 days Start taking on: September 29, 2020   levocetirizine 5 MG tablet Commonly known as: XYZAL Take 0.5 tablets (2.5 mg total) by mouth every evening. What changed:   how much  to take  when to take this  reasons to take this   levothyroxine 175 MCG tablet Commonly known as: SYNTHROID Take 1 tablet (175 mcg total) by mouth daily before breakfast.   losartan-hydrochlorothiazide 50-12.5 MG tablet Commonly known as: HYZAAR Take 1 tablet by mouth daily.   metFORMIN 500 MG tablet Commonly known as: GLUCOPHAGE Take 1 tablet (500 mg total) by mouth daily with breakfast. With food   metoprolol succinate 50 MG 24 hr tablet Commonly known as: TOPROL-XL TAKE 1 TABLET BY MOUTH DAILY What changed:   how much to take  how to take this  when to take this  additional instructions   Moderna COVID-19 Vaccine 100 MCG/0.5ML injection Generic drug: COVID-19 mRNA vaccine (Moderna)   montelukast 10 MG tablet Commonly known as: SINGULAIR TAKE 1 TABLET BY MOUTH AT BEDTIME   multivitamin with minerals Tabs tablet Take 1 tablet by mouth daily.   ondansetron 4 MG tablet Commonly known as: Zofran Take 1 tablet (4 mg total) by mouth every 8 (eight) hours as needed for nausea or vomiting.   oxybutynin 15 MG 24 hr tablet Commonly known as: DITROPAN XL Take 1 tablet (15 mg total) by mouth at bedtime.   pantoprazole 40 MG tablet Commonly known as: PROTONIX Take 40 mg by mouth daily.   pregabalin 50 MG capsule Commonly known as: LYRICA Take 1 capsule (50 mg total) by mouth 3 (three) times daily.   rosuvastatin 20 MG tablet Commonly known as: CRESTOR Take 1 tablet (20 mg total) by mouth daily.   sitaGLIPtin 25 MG tablet Commonly known as: Januvia Take 1 tablet (25 mg total) by mouth daily.   sucralfate 1 GM/10ML suspension Commonly known as: CARAFATE Take 1 g by mouth 3 (three) times daily.   thiamine 100 MG tablet Take 0.5 tablets (50 mg total) by mouth 2 (two) times daily with a meal.   Toviaz 8 MG Tb24  tablet Generic drug: fesoterodine Take 1 tablet (8 mg total) by mouth daily.   Tub Transfer Board Misc 1 Device by Does not apply route as needed.   VITAMIN B-1 PO TAKE 1/2 TABLET BY MOUTH 2 TIMES DAILY WITH A MEAL   Vitamin D3 25 MCG tablet Commonly known as: Vitamin D Take 1 tablet (1,000 Units total) by mouth 2 (two) times daily with a meal. What changed: Another medication with the same name was removed. Continue taking this medication, and follow the directions you see here. Changed by: Zara Council, PA-C       Allergies:  Allergies  Allergen Reactions  . Morphine And Related Other (See Comments) and Nausea Only    Patient becomes very confused Other reaction(s): Other (See Comments), Other (See Comments) Patient becomes very confused Patient becomes very confused   . Penicillins Hives, Shortness Of Breath, Swelling and Other (See Comments)    Facial swelling Has patient had a PCN reaction causing immediate rash, facial/tongue/throat swelling, SOB or lightheadedness with hypotension: Yes Has patient had a PCN reaction causing severe rash involving mucus membranes or skin necrosis: Yes Has patient had a PCN reaction that required hospitalization No Has patient had a PCN reaction occurring within the last 10 years: No If all of the above answers are "NO", then may proceed with Cephalosporin use.  Other reaction(s): Other (See Comments), Other (See Comments) Facial swelling Has patient had a PCN reaction causing immediate rash, facial/tongue/throat swelling, SOB or lightheadedness with hypotension: Yes Has patient had a PCN reaction causing severe rash involving mucus  membranes or skin necrosis: Yes Has patient had a PCN reaction that required hospitalization No Has patient had a PCN reaction occurring within the last 10 years: No If all of the above answers are "NO", then may proceed with Cephalosporin use. Facial swelling Has patient had a PCN reaction causing  immediate rash, facial/tongue/throat swelling, SOB or lightheadedness with hypotension: Yes Has patient had a PCN reaction causing severe rash involving mucus membranes or skin necrosis: Yes Has patient had a PCN reaction that required hospitalization No Has patient had a PCN reaction occurring within the last 10 years: No If all of the above answers are "NO", then may proceed with Cephalosporin use. Facial swelling Has patient had a PCN reaction causing immediate rash, facial/tongue/throat swelling, SOB or lightheadedness with hypotension: Yes Has patient had a PCN reaction causing severe rash involving mucus membranes or skin necrosis: Yes Has patient had a PCN reaction that required hospitalization No Has patient had a PCN reaction occurring within the last 10 years: No If all of the above answers are "NO", then may proceed with Cephalosporin use.   . Ambien [Zolpidem]     Hallucination   . Aspirin Hives  . Oxytetracycline Hives    Other reaction(s): Unknown     Family History: Family History  Problem Relation Age of Onset  . Skin cancer Father   . Diabetes Father   . Hypertension Father   . Peripheral vascular disease Father   . Cancer Father   . Cerebral aneurysm Father   . Alcohol abuse Father   . Varicose Veins Mother   . Kidney disease Mother   . Arthritis Mother   . Mental illness Sister   . Cancer Maternal Aunt        breast  . Breast cancer Maternal Aunt   . Arthritis Maternal Grandmother   . Hypertension Maternal Grandmother   . Diabetes Maternal Grandmother   . Arthritis Maternal Grandfather   . Heart disease Maternal Grandfather   . Hypertension Maternal Grandfather   . Arthritis Paternal Grandmother   . Hypertension Paternal Grandmother   . Diabetes Paternal Grandmother   . Arthritis Paternal Grandfather   . Hypertension Paternal Grandfather   . Bladder Cancer Neg Hx   . Kidney cancer Neg Hx     Social History:  reports that she has never smoked. She  has never used smokeless tobacco. She reports that she does not drink alcohol and does not use drugs.  ROS: Pertinent ROS in HPI  Physical Exam: BP 106/72   Pulse (!) 51   Ht 5\' 5"  (1.651 m)   Wt 240 lb (108.9 kg)   BMI 39.94 kg/m   Constitutional:  Well nourished. Alert and oriented, No acute distress. HEENT: [place] AT, mask in place.  Trachea midline Cardiovascular: No clubbing, cyanosis, or edema. Respiratory: Normal respiratory effort, no increased work of breathing. Neurologic: Grossly intact, no focal deficits, moving all 4 extremities. Psychiatric: Normal mood and affect.   Laboratory Data: Lab Results  Component Value Date   WBC 6.9 04/13/2020   HGB 10.3 (L) 04/13/2020   HCT 33.1 (L) 04/13/2020   MCV 85.5 04/13/2020   PLT 227 04/13/2020    Lab Results  Component Value Date   CREATININE 0.86 04/14/2020    Lab Results  Component Value Date   HGBA1C 6.7 (H) 04/07/2020    Lab Results  Component Value Date   TSH 0.939 02/06/2020       Component Value Date/Time   CHOL  65 04/07/2020 0500   CHOL 185 10/10/2014 0822   HDL 46 04/07/2020 0500   HDL 94 10/10/2014 0822   CHOLHDL 1.4 04/07/2020 0500   VLDL 10 04/07/2020 0500   LDLCALC 9 04/07/2020 0500   LDLCALC 73 10/10/2014 0822    Lab Results  Component Value Date   AST 18 04/05/2020   Lab Results  Component Value Date   ALT 14 04/05/2020   Urinalysis    Component Value Date/Time   COLORURINE YELLOW (A) 04/07/2020 1015   APPEARANCEUR Clear 05/23/2020 1540   LABSPEC 1.008 04/07/2020 1015   PHURINE 5.0 04/07/2020 1015   GLUCOSEU Negative 05/23/2020 1540   GLUCOSEU NEGATIVE 08/02/2017 [place] 04/07/2020 1015   BILIRUBINUR Negative 05/23/2020 1540   BILIRUBINUR 1+ 09/15/2017 [place] 04/07/2020 1015   PROTEINUR Trace 05/23/2020 1540   PROTEINUR NEGATIVE 04/07/2020 1015   UROBILINOGEN 0.2 09/15/2017 1319   UROBILINOGEN 0.2 08/02/2017 1427   NITRITE Negative 05/23/2020  1540   NITRITE NEGATIVE 04/07/2020 1015   LEUKOCYTESUR Negative 05/23/2020 1540   LEUKOCYTESUR NEGATIVE 04/07/2020 1015  I have reviewed the labs.   Pertinent Imaging: Results for SHAKARA, TWEEDY (MRN [id number]) as of 08/20/2020 14:02  Ref. Range 08/20/2020 13:20  Scan Result Unknown 0     Assessment & Plan:    1. Incontinence -refilled Toviaz 8 mg daily and oxybutynin XL 15 mg nightly -she would like another appointment with Dr. Matilde Sprang to see if there are other options to help with her incontinence  2.  Nocturnal enuresis -Explained to the patient that nocturnal enuresis is often multifactorial in nature and very difficult to treat and there may not be a treatment option for this -Offered referral to academic center for further work-up concerning this and she deferred  3. Bladder spasms -explained that since we do not have an etiology for her bladder spasms it would be best to evaluate her when she is having the spasms  Return for Appointment with Dr. Matilde Sprang .  These notes generated with voice recognition software. I apologize for typographical errors.  Zara Council, PA-C  [facility] [address]  [place], [place] [address] [phone]

## 2020-08-20 ENCOUNTER — Ambulatory Visit: Payer: Medicare HMO | Admitting: Family

## 2020-08-20 ENCOUNTER — Encounter: Payer: Self-pay | Admitting: Family

## 2020-08-20 ENCOUNTER — Ambulatory Visit: Payer: Medicare HMO | Admitting: Urology

## 2020-08-20 ENCOUNTER — Other Ambulatory Visit: Payer: Self-pay

## 2020-08-20 ENCOUNTER — Encounter: Payer: Self-pay | Admitting: Urology

## 2020-08-20 VITALS — BP 108/62 | HR 54 | Ht 65.5 in | Wt 236.0 lb

## 2020-08-20 VITALS — BP 106/72 | HR 51 | Ht 65.0 in | Wt 240.0 lb

## 2020-08-20 DIAGNOSIS — N3289 Other specified disorders of bladder: Secondary | ICD-10-CM

## 2020-08-20 DIAGNOSIS — Z7901 Long term (current) use of anticoagulants: Secondary | ICD-10-CM

## 2020-08-20 DIAGNOSIS — N3946 Mixed incontinence: Secondary | ICD-10-CM

## 2020-08-20 DIAGNOSIS — N3944 Nocturnal enuresis: Secondary | ICD-10-CM

## 2020-08-20 DIAGNOSIS — Z8673 Personal history of transient ischemic attack (TIA), and cerebral infarction without residual deficits: Secondary | ICD-10-CM

## 2020-08-20 DIAGNOSIS — I480 Paroxysmal atrial fibrillation: Secondary | ICD-10-CM | POA: Diagnosis not present

## 2020-08-20 DIAGNOSIS — G4733 Obstructive sleep apnea (adult) (pediatric): Secondary | ICD-10-CM | POA: Diagnosis not present

## 2020-08-20 DIAGNOSIS — E119 Type 2 diabetes mellitus without complications: Secondary | ICD-10-CM | POA: Diagnosis not present

## 2020-08-20 LAB — BLADDER SCAN AMB NON-IMAGING: Scan Result: 0

## 2020-08-20 MED ORDER — METOPROLOL SUCCINATE ER 25 MG PO TB24: 25.0000 mg | ORAL_TABLET | Freq: Every day | ORAL | 1 refills | Status: DC

## 2020-08-20 MED ORDER — TOVIAZ 8 MG PO TB24: 8.0000 mg | ORAL_TABLET | Freq: Every day | ORAL | 3 refills | Status: DC

## 2020-08-20 MED ORDER — OXYBUTYNIN CHLORIDE ER 15 MG PO TB24: 15.0000 mg | ORAL_TABLET | Freq: Every day | ORAL | 3 refills | Status: DC

## 2020-08-20 NOTE — Patient Instructions (Signed)
Medication Instructions:  Your physician has recommended you make the following change in your medication:   Remain OFF Losartan-HCTZ  REDUCE Metoprolol Succinate to 25mg  daily  *If you need a refill on your cardiac medications before your next appointment, please call your pharmacy*  Lab Work: None ordered today.    Testing/Procedures: Your EKG today showed no atrial fibrillation which is a good result.    Follow-Up: At [facility], you and your health needs are our priority.  As part of our continuing mission to provide you with exceptional heart care, we have created designated Provider Care Teams.  These Care Teams include your primary Cardiologist (physician) and Advanced Practice Providers (APPs -  Physician Assistants and Nurse Practitioners) who all work together to provide you with the care you need, when you need it.  We recommend signing up for the patient portal called "MyChart".  Sign up information is provided on this After Visit Summary.  MyChart is used to connect with patients for Virtual Visits (Telemedicine).  Patients are able to view lab/test results, encounter notes, upcoming appointments, etc.  Non-urgent messages can be sent to your provider as well.   To learn more about what you can do with MyChart, go to [email or web address].    Your next appointment:   4-6 month(s)  The format for your next appointment:   In Person  Provider:   You may see Kathlyn Sacramento, MD or one of the following Advanced Practice Providers on your designated Care Team:    Murray Hodgkins, NP  Christell Faith, PA-C  Marrianne Mood, PA-C  Cadence Kathlen Mody, [place]  Laurann [place], NP  Other Instructions  Heart Healthy Diet Recommendations: A low-salt diet is recommended. Meats should be grilled, baked, or boiled. Avoid fried foods. Focus on lean protein sources like fish or chicken with vegetables and fruits. The American Heart Association is a Microbiologist!  American Heart  Association Diet and Lifeystyle Recommendations   Exercise recommendations: The American Heart Association recommends 150 minutes of moderate intensity exercise weekly. Try 30 minutes of moderate intensity exercise 4-5 times per week. This could include walking, jogging, or swimming.

## 2020-08-20 NOTE — Progress Notes (Signed)
SERYNA, MAREK ([id number]) Visit Report for 08/16/2020 Arrival Information Details Patient Name: [name], [name] Date of Service: 08/16/2020 1:00 PM Medical Record Number: [id number] Patient Account Number: [id number] Date of Birth/Sex: 29-May-1950 (70 y.o. F) Treating RN: Cornell Barman Primary Care Naphtali Riede: [name], Olivia Mackie Other Clinician: Referring Angenette Daily: [name], Olivia Mackie Treating Everhett Bozard/Extender: Tito Dine in Treatment: 7 Visit Information History Since Last Visit Added or deleted any medications: No Patient Arrived: Wheel Chair Has Dressing in Place as Prescribed: Yes Arrival Time: 13:20 Pain Present Now: Yes Accompanied By: husband Transfer Assistance: Manual Patient Identification Verified: Yes Secondary Verification Process Completed: Yes Patient Has Alerts: Yes Patient Alerts: Patient on Blood Thinner Eliquis Diabetic II Electronic Signature(s) Signed: 08/16/2020 3:58:10 PM By: Gretta Cool, BSN, RN, CWS, Kim RN, BSN Entered By: Gretta Cool, BSN, RN, CWS, Kim on 08/16/2020 13:22:09 [name] ([id number]) -------------------------------------------------------------------------------- Clinic Level of Care Assessment Details Patient Name: [name] Date of Service: 08/16/2020 1:00 PM Medical Record Number: [id number] Patient Account Number: [id number] Date of Birth/Sex: May 04, 1950 (69 y.o. F) Treating RN: Carlene Coria Primary Care Toshio Slusher: [name], Olivia Mackie Other Clinician: Referring Geffrey Michaelsen: [name], Olivia Mackie Treating Zemira Zehring/Extender: Tito Dine in Treatment: 7 Clinic Level of Care Assessment Items TOOL 4 Quantity Score X - Use when only an EandM is performed on FOLLOW-UP visit 1 0 ASSESSMENTS - Nursing Assessment / Reassessment X - Reassessment of Co-morbidities (includes updates in patient status) 1 10 X- 1 5 Reassessment of Adherence to Treatment Plan ASSESSMENTS - Wound and Skin Assessment /  Reassessment X - Simple Wound Assessment / Reassessment - one wound 1 5 '[]'  - 0 Complex Wound Assessment / Reassessment - multiple wounds '[]'  - 0 Dermatologic / Skin Assessment (not related to wound area) ASSESSMENTS - Focused Assessment '[]'  - Circumferential Edema Measurements - multi extremities 0 '[]'  - 0 Nutritional Assessment / Counseling / Intervention '[]'  - 0 Lower Extremity Assessment (monofilament, tuning fork, pulses) '[]'  - 0 Peripheral Arterial Disease Assessment (using hand held doppler) ASSESSMENTS - Ostomy and/or Continence Assessment and Care '[]'  - Incontinence Assessment and Management 0 '[]'  - 0 Ostomy Care Assessment and Management (repouching, etc.) PROCESS - Coordination of Care X - Simple Patient / Family Education for ongoing care 1 15 '[]'  - 0 Complex (extensive) Patient / Family Education for ongoing care '[]'  - 0 Staff obtains Programmer, systems, Records, Test Results / Process Orders '[]'  - 0 Staff telephones HHA, Nursing Homes / Clarify orders / etc '[]'  - 0 Routine Transfer to another Facility (non-emergent condition) '[]'  - 0 Routine Hospital Admission (non-emergent condition) '[]'  - 0 New Admissions / Biomedical engineer / Ordering NPWT, Apligraf, etc. '[]'  - 0 Emergency Hospital Admission (emergent condition) X- 1 10 Simple Discharge Coordination '[]'  - 0 Complex (extensive) Discharge Coordination PROCESS - Special Needs '[]'  - Pediatric / Minor Patient Management 0 '[]'  - 0 Isolation Patient Management '[]'  - 0 Hearing / Language / Visual special needs '[]'  - 0 Assessment of Community assistance (transportation, D/C planning, etc.) '[]'  - 0 Additional assistance / Altered mentation '[]'  - 0 Support Surface(s) Assessment (bed, cushion, seat, etc.) INTERVENTIONS - Wound Cleansing / Measurement Harnden, Parul B. ([id number]) X- 1 5 Simple Wound Cleansing - one wound '[]'  - 0 Complex Wound Cleansing - multiple wounds X- 1 5 Wound Imaging (photographs - any number of wounds) '[]'   - 0 Wound Tracing (instead of photographs) X- 1 5 Simple Wound Measurement - one wound '[]'  - 0 Complex Wound Measurement - multiple wounds INTERVENTIONS - Wound Dressings  X - Small Wound Dressing one or multiple wounds 1 10 '[]'  - 0 Medium Wound Dressing one or multiple wounds '[]'  - 0 Large Wound Dressing one or multiple wounds X- 1 5 Application of Medications - topical '[]'  - 0 Application of Medications - injection INTERVENTIONS - Miscellaneous '[]'  - External ear exam 0 '[]'  - 0 Specimen Collection (cultures, biopsies, blood, body fluids, etc.) '[]'  - 0 Specimen(s) / Culture(s) sent or taken to Lab for analysis '[]'  - 0 Patient Transfer (multiple staff / Civil Service fast streamer / Similar devices) '[]'  - 0 Simple Staple / Suture removal (25 or less) '[]'  - 0 Complex Staple / Suture removal (26 or more) '[]'  - 0 Hypo / Hyperglycemic Management (close monitor of Blood Glucose) '[]'  - 0 Ankle / Brachial Index (ABI) - do not check if billed separately X- 1 5 Vital Signs Has the patient been seen at the hospital within the last three years: Yes Total Score: 80 Level Of Care: New/Established - Level 3 Electronic Signature(s) Signed: 08/20/2020 3:50:09 PM By: Carlene Coria RN Entered By: Carlene Coria on 08/16/2020 13:48:55 Deloria, [name] ([id number]) -------------------------------------------------------------------------------- Lower Extremity Assessment Details Patient Name: [name] Date of Service: 08/16/2020 1:00 PM Medical Record Number: [id number] Patient Account Number: [id number] Date of Birth/Sex: May 27, 1950 (70 y.o. F) Treating RN: Carlene Coria Primary Care Prentiss Hammett: [name], Olivia Mackie Other Clinician: Referring Jeraldin Fesler: [name], Olivia Mackie Treating Alixandria Friedt/Extender: Tito Dine in Treatment: 7 Electronic Signature(s) Signed: 08/20/2020 3:50:09 PM By: Carlene Coria RN Entered By: Carlene Coria on 08/16/2020 13:46:21 Kamel, [name]  ([id number]) -------------------------------------------------------------------------------- Multi Wound Chart Details Patient Name: [name] Date of Service: 08/16/2020 1:00 PM Medical Record Number: [id number] Patient Account Number: [id number] Date of Birth/Sex: [birth year] (70 y.o. F) Treating RN: Carlene Coria Primary Care Ellisha Bankson: [name], Olivia Mackie Other Clinician: Referring Robben Jagiello: [name], Olivia Mackie Treating Alaa Mullally/Extender: Tito Dine in Treatment: 7 Vital Signs Height(in): 65.5 Pulse(bpm): 60 Weight(lbs): 240 Blood Pressure(mmHg): 155/77 Body Mass Index(BMI): 39 Temperature(F): 97.8 Respiratory Rate(breaths/min): 18 Photos: [11:No Photos] [6:No Photos] Wound Location: Left Calcaneus Right Elbow Left, Distal Toe Second Wounding Event: Gradually Appeared Trauma Pressure Injury Primary Etiology: Diabetic Wound/Ulcer of the Lower Trauma, Other Diabetic Wound/Ulcer of the Lower Extremity Extremity Secondary Etiology: Pressure Ulcer N/A N/A Comorbid History: Asthma, Pneumothorax, Asthma, Pneumothorax, Asthma, Pneumothorax, Arrhythmia, Hypertension, Type II Arrhythmia, Hypertension, Type II Arrhythmia, Hypertension, Type II Diabetes, Gout, Osteoarthritis, Diabetes, Gout, Osteoarthritis, Diabetes, Gout, Osteoarthritis, Neuropathy Neuropathy Neuropathy Date Acquired: 07/25/2020 08/16/2020 01/05/2020 Weeks of Treatment: 2 0 7 Wound Status: Open Open Open Measurements L x W x D (cm) 0x0x0 0.2x1x0.1 0x0x0 Area (cm) : 0 0.157 0 Volume (cm) : 0 0.016 0 % Reduction in Area: 100.00% 0.00% 100.00% % Reduction in Volume: 100.00% 0.00% 100.00% Classification: Grade 2 Full Thickness Without Exposed Grade 1 Support Structures Exudate Amount: None Present Medium None Present Exudate Type: N/A Sanguinous N/A Exudate Color: N/A red N/A Wound Margin: Flat and Intact N/A N/A Granulation Amount: None Present (0%) None Present (0%) None Present (0%) Necrotic  Amount: None Present (0%) None Present (0%) None Present (0%) Exposed Structures: Fascia: No Fascia: No Fascia: No Fat Layer (Subcutaneous Tissue): Fat Layer (Subcutaneous Tissue): Fat Layer (Subcutaneous Tissue): No No No Tendon: No Tendon: No Tendon: No Muscle: No Muscle: No Muscle: No Joint: No Joint: No Joint: No Bone: No Bone: No Bone: No Epithelialization: Large (67-100%) None Large (67-100%) Wound Number: 7 9 N/A Photos: No Photos No Photos N/A Wound Location: Left, Posterior Achilles Right,  Lateral Forearm N/A Wounding Event: Gradually Appeared Skin Tear/Laceration N/A Primary Etiology: Diabetic Wound/Ulcer of the Lower Trauma, Other N/A Extremity Secondary Etiology: N/A N/A N/A Comorbid History: N/A TRUC, WINFREE. ([id number]) Asthma, Pneumothorax, Asthma, Pneumothorax, Arrhythmia, Hypertension, Type II Arrhythmia, Hypertension, Type II Diabetes, Gout, Osteoarthritis, Diabetes, Gout, Osteoarthritis, Neuropathy Neuropathy Date Acquired: 03/23/2020 07/29/2020 N/A Weeks of Treatment: 7 2 N/A Wound Status: Open Open N/A Measurements L x W x D (cm) 0x0x0 0x0x0 N/A Area (cm) : 0 0 N/A Volume (cm) : 0 0 N/A % Reduction in Area: 100.00% 100.00% N/A % Reduction in Volume: 100.00% 100.00% N/A Classification: Grade 1 Full Thickness Without Exposed N/A Support Structures Exudate Amount: None Present None Present N/A Exudate Type: N/A N/A N/A Exudate Color: N/A N/A N/A Wound Margin: Flat and Intact N/A N/A Granulation Amount: None Present (0%) None Present (0%) N/A Necrotic Amount: None Present (0%) None Present (0%) N/A Exposed Structures: Fascia: No Fascia: No N/A Fat Layer (Subcutaneous Tissue): Fat Layer (Subcutaneous Tissue): No No Tendon: No Tendon: No Muscle: No Muscle: No Joint: No Joint: No Bone: No Bone: No Epithelialization: Large (67-100%) None N/A Treatment Notes Electronic Signature(s) Signed: 08/16/2020 4:35:25 PM By: Linton Ham  MD Entered By: Linton Ham on 08/16/2020 13:50:47 Paglia, [name] ([id number]) -------------------------------------------------------------------------------- [place] Details Patient Name: [name] Date of Service: 08/16/2020 1:00 PM Medical Record Number: [id number] Patient Account Number: [id number] Date of Birth/Sex: Oct 26, 1950 (70 y.o. F) Treating RN: Carlene Coria Primary Care Buddy Loeffelholz: [name], Olivia Mackie Other Clinician: Referring Bladyn Tipps: [name], Olivia Mackie Treating Maui Ahart/Extender: Tito Dine in Treatment: 7 Active Inactive Wound/Skin Impairment Nursing Diagnoses: Knowledge deficit related to ulceration/compromised skin integrity Goals: Patient/caregiver will verbalize understanding of skin care regimen Date Initiated: 06/28/2020 Date Inactivated: 08/16/2020 Target Resolution Date: 07/28/2020 Goal Status: Met Ulcer/skin breakdown will have a volume reduction of 30% by week 4 Date Initiated: 06/28/2020 Date Inactivated: 08/16/2020 Target Resolution Date: 07/28/2020 Goal Status: Met Ulcer/skin breakdown will have a volume reduction of 50% by week 8 Date Initiated: 06/28/2020 Target Resolution Date: 08/28/2020 Goal Status: Active Ulcer/skin breakdown will have a volume reduction of 80% by week 12 Date Initiated: 06/28/2020 Target Resolution Date: 09/27/2020 Goal Status: Active Ulcer/skin breakdown will heal within 14 weeks Date Initiated: 06/28/2020 Target Resolution Date: 10/28/2020 Goal Status: Active Interventions: Assess patient/caregiver ability to obtain necessary supplies Assess patient/caregiver ability to perform ulcer/skin care regimen upon admission and as needed Assess ulceration(s) every visit Notes: Electronic Signature(s) Signed: 08/20/2020 3:50:09 PM By: Carlene Coria RN Entered By: Carlene Coria on 08/16/2020 13:46:39 Ho, [name]  ([id number]) -------------------------------------------------------------------------------- Pain Assessment Details Patient Name: [name] Date of Service: 08/16/2020 1:00 PM Medical Record Number: [id number] Patient Account Number: [id number] Date of Birth/Sex: [birth year] (70 y.o. F) Treating RN: Cornell Barman Primary Care Makylie Rivere: [name], Olivia Mackie Other Clinician: Referring Blanche Gallien: [name], Olivia Mackie Treating Ferguson Gertner/Extender: Tito Dine in Treatment: 7 Active Problems Location of Pain Severity and Description of Pain Patient Has Paino Yes Site Locations Rate the pain. Current Pain Level: 6 Pain Management and Medication Current Pain Management: Notes Bottoms of both feet hurt when standing. Electronic Signature(s) Signed: 08/16/2020 3:58:10 PM By: Gretta Cool, BSN, RN, CWS, Kim RN, BSN Entered By: Gretta Cool, BSN, RN, CWS, Kim on 08/16/2020 13:23:03 SALEEMAH, MOLLENHAUER ([id number]) -------------------------------------------------------------------------------- Patient/Caregiver Education Details Patient Name: [name] Date of Service: 08/16/2020 1:00 PM Medical Record Number: [id number] Patient Account Number: [id number] Date of Birth/Gender: 1950/06/01 (70 y.o. F) Treating RN: Carlene Coria Primary Care Physician:  [name], Olivia Mackie Other Clinician: Referring Physician: [name], Olivia Mackie Treating Physician/Extender: Tito Dine in Treatment: 7 Education Assessment Education Provided To: Patient Education Topics Provided Wound/Skin Impairment: Methods: Explain/Verbal Responses: State content correctly Electronic Signature(s) Signed: 08/20/2020 3:50:09 PM By: Carlene Coria RN Entered By: Carlene Coria on 08/16/2020 13:49:13 Lafevers, [name] ([id number]) -------------------------------------------------------------------------------- Wound Assessment Details Patient Name: [name] Date of Service: 08/16/2020 1:00  PM Medical Record Number: [id number] Patient Account Number: [id number] Date of Birth/Sex: September 07, 1950 (70 y.o. F) Treating RN: Carlene Coria Primary Care Bradd Merlos: [name], Olivia Mackie Other Clinician: Referring Jaidin Richison: [name], Olivia Mackie Treating Reigna Ruperto/Extender: Tito Dine in Treatment: 7 Wound Status Wound Number: 10 Primary Diabetic Wound/Ulcer of the Lower Extremity Etiology: Wound Location: Left Calcaneus Secondary Pressure Ulcer Wounding Event: Gradually Appeared Etiology: Date Acquired: 07/25/2020 Wound Status: Open Weeks Of Treatment: 2 Comorbid Asthma, Pneumothorax, Arrhythmia, Hypertension, Type Clustered Wound: No History: II Diabetes, Gout, Osteoarthritis, Neuropathy Photos Wound Measurements Length: (cm) 0 Width: (cm) 0 Depth: (cm) 0 Area: (cm) 0 Volume: (cm) 0 % Reduction in Area: 100% % Reduction in Volume: 100% Epithelialization: Large (67-100%) Tunneling: No Undermining: No Wound Description Classification: Grade 2 Wound Margin: Flat and Intact Exudate Amount: None Present Foul Odor After Cleansing: No Slough/Fibrino No Wound Bed Granulation Amount: None Present (0%) Exposed Structure Necrotic Amount: None Present (0%) Fascia Exposed: No Fat Layer (Subcutaneous Tissue) Exposed: No Tendon Exposed: No Muscle Exposed: No Joint Exposed: No Bone Exposed: No Electronic Signature(s) Signed: 08/20/2020 3:50:09 PM By: Carlene Coria RN Entered By: Carlene Coria on 08/16/2020 13:46:04 Copeman, [name] ([id number]) -------------------------------------------------------------------------------- Wound Assessment Details Patient Name: [name] Date of Service: 08/16/2020 1:00 PM Medical Record Number: [id number] Patient Account Number: [id number] Date of Birth/Sex: [birth year] (70 y.o. F) Treating RN: Carlene Coria Primary Care Brianca Fortenberry: [name], Olivia Mackie Other Clinician: Referring Corydon Schweiss: [name], Olivia Mackie Treating  Orlan Aversa/Extender: Tito Dine in Treatment: 7 Wound Status Wound Number: 11 Primary Trauma, Other Etiology: Wound Location: Right Elbow Wound Open Wounding Event: Trauma Status: Date Acquired: 08/16/2020 Comorbid Asthma, Pneumothorax, Arrhythmia, Hypertension, Type Weeks Of Treatment: 0 History: II Diabetes, Gout, Osteoarthritis, Neuropathy Clustered Wound: No Wound Measurements Length: (cm) 0.2 Width: (cm) 1 Depth: (cm) 0.1 Area: (cm) 0.157 Volume: (cm) 0.016 % Reduction in Area: 0% % Reduction in Volume: 0% Epithelialization: None Tunneling: No Undermining: No Wound Description Classification: Full Thickness Without Exposed Support Structu Exudate Amount: Medium Exudate Type: Sanguinous Exudate Color: red res Foul Odor After Cleansing: No Slough/Fibrino No Wound Bed Granulation Amount: None Present (0%) Exposed Structure Necrotic Amount: None Present (0%) Fascia Exposed: No Fat Layer (Subcutaneous Tissue) Exposed: No Tendon Exposed: No Muscle Exposed: No Joint Exposed: No Bone Exposed: No Electronic Signature(s) Signed: 08/20/2020 3:50:09 PM By: Carlene Coria RN Entered By: Carlene Coria on 08/16/2020 13:42:24 Roehm, [name] ([id number]) -------------------------------------------------------------------------------- Wound Assessment Details Patient Name: [name] Date of Service: 08/16/2020 1:00 PM Medical Record Number: [id number] Patient Account Number: [id number] Date of Birth/Sex: 1951-03-23 (70 y.o. F) Treating RN: Carlene Coria Primary Care Delrick Dehart: [name], Olivia Mackie Other Clinician: Referring Yoceline Bazar: [name], Olivia Mackie Treating Shalah Estelle/Extender: Tito Dine in Treatment: 7 Wound Status Wound Number: 6 Primary Diabetic Wound/Ulcer of the Lower Extremity Etiology: Wound Location: Left, Distal Toe Second Wound Open Wounding Event: Pressure Injury Status: Date Acquired: 01/05/2020 Comorbid Asthma,  Pneumothorax, Arrhythmia, Hypertension, Type Weeks Of Treatment: 7 History: II Diabetes, Gout, Osteoarthritis, Neuropathy Clustered Wound: No Wound Measurements Length: (cm) Width: (cm) Depth: (cm) Area: (cm) Volume: (cm) 0 %  Reduction in Area: 100% 0 % Reduction in Volume: 100% 0 Epithelialization: Large (67-100%) 0 Tunneling: No 0 Undermining: No Wound Description Classification: Grade 1 Exudate Amount: None Present Foul Odor After Cleansing: No Slough/Fibrino Yes Wound Bed Granulation Amount: None Present (0%) Exposed Structure Necrotic Amount: None Present (0%) Fascia Exposed: No Fat Layer (Subcutaneous Tissue) Exposed: No Tendon Exposed: No Muscle Exposed: No Joint Exposed: No Bone Exposed: No Electronic Signature(s) Signed: 08/20/2020 3:50:09 PM By: Carlene Coria RN Entered By: Carlene Coria on 08/16/2020 13:36:53 Duan, [name] ([id number]) -------------------------------------------------------------------------------- Wound Assessment Details Patient Name: [name] Date of Service: 08/16/2020 1:00 PM Medical Record Number: [id number] Patient Account Number: [id number] Date of Birth/Sex: 03/23/1951 (69 y.o. F) Treating RN: Carlene Coria Primary Care Ivin Rosenbloom: [name], Olivia Mackie Other Clinician: Referring Anaja Monts: [name], Olivia Mackie Treating Onix Jumper/Extender: Tito Dine in Treatment: 7 Wound Status Wound Number: 7 Primary Diabetic Wound/Ulcer of the Lower Extremity Etiology: Wound Location: Left, Posterior Achilles Wound Open Wounding Event: Gradually Appeared Status: Date Acquired: 03/23/2020 Comorbid Asthma, Pneumothorax, Arrhythmia, Hypertension, Type Weeks Of Treatment: 7 History: II Diabetes, Gout, Osteoarthritis, Neuropathy Clustered Wound: No Wound Measurements Length: (cm) Width: (cm) Depth: (cm) Area: (cm) Volume: (cm) 0 % Reduction in Area: 100% 0 % Reduction in Volume: 100% 0 Epithelialization: Large  (67-100%) 0 Tunneling: No 0 Undermining: No Wound Description Classification: Grade 1 Wound Margin: Flat and Intact Exudate Amount: None Present Foul Odor After Cleansing: No Slough/Fibrino No Wound Bed Granulation Amount: None Present (0%) Exposed Structure Necrotic Amount: None Present (0%) Fascia Exposed: No Fat Layer (Subcutaneous Tissue) Exposed: No Tendon Exposed: No Muscle Exposed: No Joint Exposed: No Bone Exposed: No Electronic Signature(s) Signed: 08/20/2020 3:50:09 PM By: Carlene Coria RN Entered By: Carlene Coria on 08/16/2020 13:37:06 Repsher, [name] ([id number]) -------------------------------------------------------------------------------- Wound Assessment Details Patient Name: [name] Date of Service: 08/16/2020 1:00 PM Medical Record Number: [id number] Patient Account Number: [id number] Date of Birth/Sex: [birth year] (70 y.o. F) Treating RN: Carlene Coria Primary Care Elissia Spiewak: [name], Olivia Mackie Other Clinician: Referring Miachel Nardelli: [name], Olivia Mackie Treating Tanisa Lagace/Extender: Tito Dine in Treatment: 7 Wound Status Wound Number: 9 Primary Trauma, Other Etiology: Wound Location: Right, Lateral Forearm Wound Open Wounding Event: Skin Tear/Laceration Status: Date Acquired: 07/29/2020 Comorbid Asthma, Pneumothorax, Arrhythmia, Hypertension, Type Weeks Of Treatment: 2 History: II Diabetes, Gout, Osteoarthritis, Neuropathy Clustered Wound: No Wound Measurements Length: (cm) 0 Width: (cm) 0 Depth: (cm) 0 Area: (cm) Volume: (cm) % Reduction in Area: 100% % Reduction in Volume: 100% Epithelialization: None 0 Tunneling: No 0 Undermining: No Wound Description Classification: Full Thickness Without Exposed Support Structure Exudate Amount: None Present s Foul Odor After Cleansing: No Slough/Fibrino No Wound Bed Granulation Amount: None Present (0%) Exposed Structure Necrotic Amount: None Present (0%) Fascia Exposed:  No Fat Layer (Subcutaneous Tissue) Exposed: No Tendon Exposed: No Muscle Exposed: No Joint Exposed: No Bone Exposed: No Electronic Signature(s) Signed: 08/20/2020 3:50:09 PM By: Carlene Coria RN Entered By: Carlene Coria on 08/16/2020 13:37:24 Devery, [name] ([id number]) -------------------------------------------------------------------------------- Vitals Details Patient Name: [name] Date of Service: 08/16/2020 1:00 PM Medical Record Number: [id number] Patient Account Number: [id number] Date of Birth/Sex: October 14, 1950 (70 y.o. F) Treating RN: Cornell Barman Primary Care Hester Forget: [name], Olivia Mackie Other Clinician: Referring Rayana Geurin: [name], Olivia Mackie Treating Erionna Strum/Extender: Tito Dine in Treatment: 7 Vital Signs Time Taken: 13:22 Temperature (F): 97.8 Height (in): 65.5 Pulse (bpm): 60 Weight (lbs): 240 Respiratory Rate (breaths/min): 18 Body Mass Index (BMI): 39.3 Blood Pressure (mmHg): 155/77 Reference Range: 80 -  120 mg / dl Electronic Signature(s) Signed: 08/16/2020 3:58:10 PM By: Gretta Cool, BSN, RN, CWS, Kim RN, BSN Entered By: Gretta Cool, BSN, RN, CWS, Kim on 08/16/2020 13:22:31

## 2020-08-20 NOTE — Progress Notes (Signed)
Office Visit    Patient Name: [name] Date of Encounter: 08/20/2020  Primary Care Provider:  [name], [name], MD Primary Cardiologist:  Kathlyn Sacramento, MD Electrophysiologist:  None   Chief Complaint    [name] is a 70 y.o. adult with a hx of CVA, HTN, DM2, morbid obesity, chronic pain, PAF, previous DVT, OSA on CPAP, obesity presents today for follow-up of HTN, PAF.  Past Medical History    Past Medical History:  Diagnosis Date  . Abnormal antibody titer   . Anxiety and depression   . Arthritis   . Asthma   . Cystocele   . Depression   . Diabetes ([place])   . DVT (deep venous thrombosis) (H[facility])    Righ calf  . Dyspnea    11/08/2018 - "more now due to lack of exercise"  . Dysrhythmia    afib  . Edema   . GERD (gastroesophageal reflux disease)   . Heart murmur    Echocardiogram June 2015: Mild MR (possible vegetation seen on TTE, not seen on TEE), normal LV size with moderate concentric LVH. Normal function EF 60-65%. Normal diastolic function. Mild LA dilation.  Marland Kitchen History of IBS   . History of kidney stones   . History of methicillin resistant staphylococcus aureus (MRSA) 2008  . Hyperlipidemia   . Hypertension   . Hypothyroidism   . Insomnia   . Kidney stone    kidney stones with lithotripsy .  [facility]- "adrenal glands"  . Neuropathy involving both lower extremities   . Obesity, Class II, BMI 35-39.9, with comorbidity   . Paroxysmal atrial fibrillation ([place]) 2007   In June 2015, Cardiac Event Monitor: Mostly SR/sinus arrhythmia with PVCs that are frequent. Short bursts of A. fib lasting several minutes;; CHA2DS2-VASc Score = 5 (age, Female, PVD, DM, HTN)  . Peripheral vascular disease ([place])   . Sleep apnea    use C-PAP  . Urinary incontinence   . Venous stasis dermatitis of both lower extremities    Past Surgical History:  Procedure Laterality Date  . ANKLE SURGERY Right   . APPLICATION OF WOUND VAC Left 06/27/2015   Procedure:  APPLICATION OF WOUND VAC ( POSSIBLE ) ;  Surgeon: [name], MD;  Location: [facility];  Service: Vascular;  Laterality: Left;  . APPLICATION OF WOUND VAC Left 09/25/2016   Procedure: APPLICATION OF WOUND VAC;  Surgeon: [name], DPM;  Location: [facility];  Service: Podiatry;  Laterality: Left;  . arm surgery     right    . CHOLECYSTECTOMY    . COLONOSCOPY    . COLONOSCOPY WITH PROPOFOL N/A 01/11/2017   Procedure: COLONOSCOPY WITH PROPOFOL;  Surgeon: [name], MD;  Location: [facility] ENDOSCOPY;  Service: Endoscopy;  Laterality: N/A;  . COLONOSCOPY WITH PROPOFOL N/A 02/22/2017   Procedure: COLONOSCOPY WITH PROPOFOL;  Surgeon: [name], MD;  Location[facility] ENDOSCOPY;  Service: Endoscopy;  Laterality: N/A;  . COLONOSCOPY WITH PROPOFOL N/A 05/31/2017   Procedure: COLONOSCOPY WITH PROPOFOL;  Surgeon: [name], MD;  Location: [facility] ENDOSCOPY;  Service: Endoscopy;  Laterality: N/A;  . ESOPHAGOGASTRODUODENOSCOPY (EGD) WITH PROPOFOL N/A 01/11/2017   Procedure: ESOPHAGOGASTRODUODENOSCOPY (EGD) WITH PROPOFOL;  Surgeon: [name], MD;  Location: [facility] ENDOSCOPY;  Service: Endoscopy;  Laterality: N/A;  . ESOPHAGOGASTRODUODENOSCOPY (EGD) WITH PROPOFOL N/A 10/25/2018   Procedure: ESOPHAGOGASTRODUODENOSCOPY (EGD) WITH PROPOFOL;  Surgeon: [name], MD;  Location: [facility] ENDOSCOPY;  Service: Endoscopy;  Laterality: N/A;  .  ESOPHAGOGASTRODUODENOSCOPY (EGD) WITH PROPOFOL N/A 11/29/2019   Procedure: ESOPHAGOGASTRODUODENOSCOPY (EGD) WITH PROPOFOL;  Surgeon: Toledo, Benay Pike, MD;  Location: [facility] ENDOSCOPY;  Service: Gastroenterology;  Laterality: N/A;  . HERNIA REPAIR     umbilical  . HIATAL HERNIA REPAIR    . I & D EXTREMITY Left 06/27/2015   Procedure: IRRIGATION AND DEBRIDEMENT EXTREMITY            ( CALF HEMATOMA ) POSSIBLE WOUND VAC;  Surgeon: [name], MD;  Location: [facility];  Service: Vascular;  Laterality: Left;  . INCISION AND DRAINAGE OF WOUND Left 09/25/2016    Procedure: IRRIGATION AND DEBRIDEMENT - PARTIAL RESECTION OF ACHILLES TENDON WITH WOUND VAC APPLICATION;  Surgeon: [name], DPM;  Location: [facility];  Service: Podiatry;  Laterality: Left;  . INCISION AND DRAINAGE OF WOUND Left 11/09/2018   Procedure: Excision of left foot wound with ACell placement;  Surgeon: Wallace Going, DO;  Location: [facility];  Service: Plastics;  Laterality: Left;  . IRRIGATION AND DEBRIDEMENT ABSCESS Left 09/07/2016   Procedure: IRRIGATION AND DEBRIDEMENT ABSCESS LEFT HEEL;  Surgeon: [name], DPM;  Location: [facility];  Service: Podiatry;  Laterality: Left;  . JOINT REPLACEMENT  2013   left knee replacement  . KIDNEY SURGERY Right    kidney stones  . LITHOTRIPSY    . LOWER EXTREMITY ANGIOGRAPHY Left 04/04/2018   Procedure: LOWER EXTREMITY ANGIOGRAPHY;  Surgeon: [name], MD;  Location: [facility];  Service: Cardiovascular;  Laterality: Left;  . NM GATED MYOVIEW ([place] [facility])  February 2017   Likely breast attenuation. LOW RISK study. Normal EF 55-60%.  . OTHER SURGICAL HISTORY     08/2016 or 09/2016 surgery on achilles tenso h/o staph infection heal. Dr. [name]  . removal of left hematoma Left    leg  . REVERSE SHOULDER ARTHROPLASTY Right 10/08/2015   Procedure: REVERSE SHOULDER ARTHROPLASTY;  Surgeon: [name], MD;  Location: [facility];  Service: Orthopedics;  Laterality: Right;  . SLEEVE GASTROPLASTY    . TONSILLECTOMY    . TOTAL KNEE ARTHROPLASTY Left   . TOTAL SHOULDER REPLACEMENT Right   . TRANSESOPHAGEAL ECHOCARDIOGRAM  08/08/2013   Mild LVH, EF 60-65%. Moderate LA dilation and mild RA dilation. Mild MR with no evidence of stenosis and no evidence of endocarditis. A false chordae is noted.  . TRANSTHORACIC ECHOCARDIOGRAM  08/03/2013   Mild-Moderate concentric LVH, EF 60-65%. Normal diastolic function. Mild LA dilation. Mild MR with possible vegetation  - not confirmed on TEE   . ULNAR NERVE TRANSPOSITION Right 08/06/2016    Procedure: ULNAR NERVE DECOMPRESSION/TRANSPOSITION;  Surgeon: [name], MD;  Location: [facility];  Service: Orthopedics;  Laterality: Right;  . UPPER GI ENDOSCOPY    . VAGINAL HYSTERECTOMY      Allergies  Allergies  Allergen Reactions  . Morphine And Related Other (See Comments) and Nausea Only    Patient becomes very confused Other reaction(s): Other (See Comments), Other (See Comments) Patient becomes very confused Patient becomes very confused   . Penicillins Hives, Shortness Of Breath, Swelling and Other (See Comments)    Facial swelling Has patient had a PCN reaction causing immediate rash, facial/tongue/throat swelling, SOB or lightheadedness with hypotension: Yes Has patient had a PCN reaction causing severe rash involving mucus membranes or skin necrosis: Yes Has patient had a PCN reaction that required hospitalization No Has patient had a PCN reaction occurring within the last 10 years: No If all of the above  answers are "NO", then may proceed with Cephalosporin use.  Other reaction(s): Other (See Comments), Other (See Comments) Facial swelling Has patient had a PCN reaction causing immediate rash, facial/tongue/throat swelling, SOB or lightheadedness with hypotension: Yes Has patient had a PCN reaction causing severe rash involving mucus membranes or skin necrosis: Yes Has patient had a PCN reaction that required hospitalization No Has patient had a PCN reaction occurring within the last 10 years: No If all of the above answers are "NO", then may proceed with Cephalosporin use. Facial swelling Has patient had a PCN reaction causing immediate rash, facial/tongue/throat swelling, SOB or lightheadedness with hypotension: Yes Has patient had a PCN reaction causing severe rash involving mucus membranes or skin necrosis: Yes Has patient had a PCN reaction that required hospitalization No Has patient had a PCN reaction occurring within the last 10 years: No If all of the  above answers are "NO", then may proceed with Cephalosporin use. Facial swelling Has patient had a PCN reaction causing immediate rash, facial/tongue/throat swelling, SOB or lightheadedness with hypotension: Yes Has patient had a PCN reaction causing severe rash involving mucus membranes or skin necrosis: Yes Has patient had a PCN reaction that required hospitalization No Has patient had a PCN reaction occurring within the last 10 years: No If all of the above answers are "NO", then may proceed with Cephalosporin use.   . Ambien [Zolpidem]     Hallucination   . Aspirin Hives  . Oxytetracycline Hives    Other reaction(s): Unknown    History of Present Illness    [name] is a 70 y.o. adult with a hx of CVA, HTN, DM2, morbid obesity, chronic pain, PAF, previous DVT, OSA on CPAP, obesity last seen 05/03/20.  She follows with Dr. Lucky Cowboy for chronic left foot ulceration.  There was no evidence of PAD on noninvasive Doppler as well as angiogram done in 2020.  She had debridement with subsequent improvement.   She had atypical chest pain in the past and had Lexiscan Myoview 03/2018 was low risk with no evidence of ischemia.  Her PAF has been well controlled on Toprol 50 daily, she had bradycardia on higher doses.  She is anticoagulated with Pradaxa.   She was admitted June 2021 with pneumonia and sepsis.  She had minimally elevated troponins of 120, 170, 143 which were asymptomatic and was presumed to be due to demand ischemia. Echocardiogram while admitted with EF 60 to 65%, no RWMA, RV normal size and function, no significant valvular abnormalities.  Called the office 10/30/2019 noting similar symptoms to when she was understandably concerned.  Subsequent urine culture with PCP with showed Klebsiella resistant to many antibiotics.  Started on Levaquin which is the only oral option left.  Admitted 12/21/2019-12/27/2019 after presenting with AMS and generalized weakness with particular weakness of  LUE.  MRI of brain with bilateral small multiple acute strokes suggestive of embolic etiology.  Her Pradaxa was transitioned to Eliquis.  LDL 44 and A1c 6.9 while admitted.  She was discharged to SNF and eventually home.   Seen in follow up 01/30/20. She was started on Crestor for hyperlipidemia and history of CVA. She was in a junctional rhythm 55bpm and provided with patient assistance for Eliquis. Since last seen she was hospitalized 02/05/20 for new CVA by MRA. Neurology suspected recurrent strokes due to severe malnutrition given gastric sleeve and not taking vitamins as prescribed (vit B12 deficiency can lead to hypercoagulable state). CT chest/abd/pelvis negative for malignancies though showed  chronic, stable adrenal mass.   Readmitted 04/06/20-04/15/20 with concern for recurrent stroke but was ruled out. Presentation was consistent with small vessel disease on MRI. Her Metforming and HCTZ were discontinued in the setting for nausea though noted on med list from SNF today. Echo bubble study 04/07/20 with normal LVEF and no evidence of ASD or PFO. She was recommended for SNF though discharged home. Since being home her PCP has facilitated admission to SNF due to continued deconditioning.    She was seen 04/2020 while at SNF and planning to transition home. Her Amlodipine was stopped due to hypotension.   She presents today for follow up with her husband. Following with vascular surgery as well as wound clinic with nonhealing ulcer of heel. Microvascular disease is suspected. She reports her dyspnea on exertion is stable at her baseline. Denies palpitations, chest pain, pressure, tightness. Reports occasional dizziness with sensation of spinning. Tells me Losartan-HCTZ was discontinued by another provider. She is still taking Toprol 50mg  daily and reduced dose was discussed.    EKGs/Labs/Other Studies Reviewed:   The following studies were reviewed today:  Echo 08/2019 1. Left ventricular ejection  fraction, by estimation, is 60 to 65%. The  left ventricle has normal function. The left ventricle has no regional  wall motion abnormalities. Left ventricular diastolic parameters were  normal.   2. Right ventricular systolic function is normal. The right ventricular  size is normal.   3. The mitral valve is normal in structure. No evidence of mitral valve  regurgitation. No evidence of mitral stenosis.   4. The aortic valve is normal in structure. Aortic valve regurgitation is  not visualized. No aortic stenosis is present.   5. The inferior vena cava is normal in size with <50% respiratory  variability, suggesting right atrial pressure of 8 mmHg.   EKG:  EKG is ordered today.  The ekg ordered today demonstrates junctional rhythm 54 bpm with no acute ST/T wave changes.  Recent Labs: 09/02/2019: B Natriuretic Peptide 384.0 02/06/2020: TSH 0.939 04/05/2020: ALT 14 04/13/2020: Hemoglobin 10.3; Platelets 227 04/14/2020: BUN 25; Creatinine, Ser 0.86; Magnesium 2.1; Potassium 4.7; Sodium 138  Recent Lipid Panel    Component Value Date/Time   CHOL 65 04/07/2020 0500   CHOL 185 10/10/2014 0822   TRIG 51 04/07/2020 0500   HDL 46 04/07/2020 0500   HDL 94 10/10/2014 0822   CHOLHDL 1.4 04/07/2020 0500   VLDL 10 04/07/2020 0500   LDLCALC 9 04/07/2020 0500   LDLCALC 73 10/10/2014 0822    Risk Assessment/Calculations:   CHA2DS2-VASc Score = 6  This indicates a 9.7% annual risk of stroke. The patient's score is based upon: CHF History: No HTN History: Yes Diabetes History: Yes Stroke History: Yes Vascular Disease History: No Age Score: 1 Gender Score: 1  Home Medications   No outpatient medications have been marked as taking for the 08/20/20 encounter (Appointment) with Loel Dubonnet, NP.   Current Facility-Administered Medications for the 08/20/20 encounter (Appointment) with Loel Dubonnet, NP  Medication  . ipratropium-albuterol (DUONEB) 0.5-2.5 (3) MG/3ML nebulizer  solution 3 mL     Review of Systems  All other systems reviewed and are otherwise negative except as noted above.  Physical Exam    VS:  There were no vitals taken for this visit. , BMI There is no height or weight on file to calculate BMI.  Wt Readings from Last 3 Encounters:  07/15/20 215 lb (97.5 kg)  04/08/20 245 lb 6 oz (111.3  kg)  04/04/20 220 lb (99.8 kg)   GEN: Well nourished, overweight, well developed, in no acute distress. HEENT: normal. Neck: Supple, no JVD, carotid bruits, or masses. Cardiac: bradycardic, RRR, no murmurs, rubs, or gallops. No clubbing, cyanosis, edema.  Radials/DP/PT 2+ and equal bilaterally.  Respiratory:  Respirations regular and unlabored, clear to auscultation bilaterally. GI: Soft, nontender, nondistended. MS: No deformity or atrophy. Skin: Warm and dry, no rash. Neuro:  Strength and sensation are intact. LUE 4/5 strength. RUE 5/5.  Psych: Normal affect.  Assessment & Plan    1. PAF/chronic anticoagulation- Heart RRR on examination today. Denies palpitations. She is mildly bradycardic today with occasional lightheadedness. Reduce toprol to 25mg  daily.  CHA2DS2-VASc Score = 6 [CHF History: No, HTN History: Yes, Diabetes History: Yes, Stroke History: Yes, Vascular Disease History: No, Age Score: 1, Gender Score: 1].  Therefore, the patient's annual risk of stroke is 9.7 %. She is anticoagulated with Eliquis 5mg  BID due to Pradaxa failure and reports no bleeding complications.   2. HTN- BP low normal. Not taking Losartan-HCTZ, no indication to resume. Encouraged home monitoring.   3. CVA- Continue Rosuvastatin 20mg  daily. No aspirin due to chronic anticoagulation. Continue to follow with neurology.  4. DM2 - Continue to follow with PCP.  5. OSA- CPAP compliance encouraged.  Disposition: Follow up in 4-6 month(s) with Dr. Fletcher Anon or APP   Signed, Loel Dubonnet, NP 08/20/2020, 12:51 PM [place]

## 2020-08-20 NOTE — Progress Notes (Signed)
[name], [name] ([id number]) Visit Report for 08/16/2020 HPI Details Patient Name: [name], [name] Date of Service: 08/16/2020 1:00 PM Medical Record Number: [id number] Patient Account Number: [id number] Date of Birth/Sex: 04-29-1950 (70 y.o. F) Treating RN: Carlene Coria Primary Care Provider: [name], Olivia Mackie Other Clinician: Referring Provider: [name], Olivia Mackie Treating Provider/Extender: Tito Dine in Treatment: 7 History of Present Illness HPI Description: 06/28/20 upon evaluation today patient appears to be doing unfortunately somewhat poorly in regard to wounds that she has over her lower extremities. She has a wound on the left distal second toe, left posterior heel, and right anterior lower leg. With that being said all in all none of the wounds appear to be too bad the one that hurts her the most is actually the wound on the posterior heel which actually is also one of the smallest. Nonetheless I think there is some callus hiding what is really going on in this area. Fortunately there does not appear to be any obvious evidence of infection which is great news. Patient does have a history of diabetes mellitus type 2, chronic venous insufficiency, hypertension, history of DVTs, and is on long-term anticoagulant therapy, Eliquis. 07/05/20 upon evaluation today patient appears to be doing well currently with regard to her wounds. I feel like she is making progress which is great news and overall there is no signs of active infection at this time. No fevers, chills, nausea, vomiting, or diarrhea. 4/29; patient has a only a small area remaining on the tip of her left second toe and a small area remaining on the left heel. The area on the right anterior lower leg is healed. They tell me that she had remote podiatric surgery on the toes of the left foot however they are very fixed in position and I wonder whether they are making it difficult to relieve pressure in her  foot wear. She is a type II diabetic with neuropathy as well. 08/01/2020 upon evaluation today patient appears to be doing well currently in regard to her wounds. In fact everything appears to be doing much better. The posterior aspect of her heel is actually giving her some trouble not the Achilles area but a small wound that we did not even have marked. In fact I had noted this before. Nonetheless we are going to addressed that today. 5/27; most of the patient's wounds are healed including the left Achilles heel left toe. Right forearm is also healed. She has a new abrasion posteriorly in the right elbow area just above the olecranon this happened today with an abrasion from her car door Electronic Signature(s) Signed: 08/16/2020 4:35:25 PM By: Linton Ham MD Entered By: Linton Ham on 08/16/2020 13:51:52 [place], [name] ([id number]) -------------------------------------------------------------------------------- Physical Exam Details Patient Name: [name] Date of Service: 08/16/2020 1:00 PM Medical Record Number: [id number] Patient Account Number: [id number] Date of Birth/Sex: 12/14/1950 (70 y.o. F) Treating RN: Carlene Coria Primary Care Provider: [name], Olivia Mackie Other Clinician: Referring Provider: [name], Olivia Mackie Treating Provider/Extender: Tito Dine in Treatment: 7 Constitutional Sitting or standing Blood Pressure is within target range for patient.. Pulse regular and within target range for patient.Marland Kitchen Respirations regular, non- labored and within target range.. Temperature is normal and within the target range for the patient.Marland Kitchen appears in no distress. Notes Wound exam; her left Achilles heel and the left heel were carefully examined all of this is healed. Left toe is also healed. Area on the right forearm is also healed. She has a new  abrasion/skin tear on the posterior right arm just above the olecranon. This happened today this is a small  skin tear as well Electronic Signature(s) Signed: 08/16/2020 4:35:25 PM By: Linton Ham MD Entered By: Linton Ham on 08/16/2020 13:52:54 [name], [name] ([id number]) -------------------------------------------------------------------------------- Physician Orders Details Patient Name: [name] Date of Service: 08/16/2020 1:00 PM Medical Record Number: [id number] Patient Account Number: [id number] Date of Birth/Sex: July 17, 1950 (70 y.o. F) Treating RN: Carlene Coria Primary Care Provider: [name], Olivia Mackie Other Clinician: Referring Provider: [name], Olivia Mackie Treating Provider/Extender: Tito Dine in Treatment: 7 Verbal / Phone Orders: No Diagnosis Coding Follow-up Appointments o Return Appointment in 1 week. [facility] for wound care. May utilize formulary equivalent dressing for wound treatment orders unless otherwise specified. Home Health Nurse may visit PRN to address patientos wound care needs. [facility] Bathing/ Shower/ Hygiene o May shower; gently cleanse wound with antibacterial soap, rinse and pat dry prior to dressing wounds Edema Control - Lymphedema / Segmental Compressive Device / Other o Elevate, Exercise Daily and Avoid Standing for Long Periods of Time. o Elevate legs to the level of the heart and pump ankles as often as possible o Elevate leg(s) parallel to the floor when sitting. Wound Treatment Wound #11 - Elbow Wound Laterality: Right Cleanser: Normal Saline 1 x Per Day/30 Days Discharge Instructions: Wash your hands with soap and water. Remove old dressing, discard into plastic bag and place into trash. Cleanse the wound with Normal Saline prior to applying a clean dressing using gauze sponges, not tissues or cotton balls. Do not scrub or use excessive force. Pat dry using gauze sponges, not tissue or cotton balls. Cleanser: Soap and Water 1 x Per Day/30 Days Discharge Instructions:  Gently cleanse wound with antibacterial soap, rinse and pat dry prior to dressing wounds Primary Dressing: Xeroform-HBD 2x2 (in/in) (DME) (Generic) 1 x Per Day/30 Days Discharge Instructions: Apply Xeroform-HBD 2x2 (in/in) as directed Secondary Dressing: ABD Pad 5x9 (in/in) (DME) (Generic) 1 x Per Day/30 Days Discharge Instructions: Cover with ABD pad Secondary Dressing: Kerlix 4.5 x 4.1 (in/yd) (DME) (Generic) 1 x Per Day/30 Days Discharge Instructions: Apply Kerlix 4.5 x 4.1 (in/yd) as instructed Secured With: 43M Medipore H Soft Cloth Surgical Tape, 2x2 (in/yd) (DME) (Generic) 1 x Per Day/30 Days Electronic Signature(s) Signed: 08/16/2020 4:35:25 PM By: Linton Ham MD Signed: 08/20/2020 3:50:09 PM By: Carlene Coria RN Entered By: Carlene Coria on 08/16/2020 13:48:09 [name], [name] ([id number]) -------------------------------------------------------------------------------- Problem List Details Patient Name: [name] Date of Service: 08/16/2020 1:00 PM Medical Record Number: [id number] Patient Account Number: [id number] Date of Birth/Sex: [birth year] (70 y.o. F) Treating RN: Carlene Coria Primary Care Provider: [name], Olivia Mackie Other Clinician: Referring Provider: [name], Olivia Mackie Treating Provider/Extender: Tito Dine in Treatment: 7 Active Problems ICD-10 Encounter Code Description Active Date MDM Diagnosis E11.621 Type 2 diabetes mellitus with foot ulcer 06/28/2020 No Yes I87.2 Venous insufficiency (chronic) (peripheral) 06/28/2020 No Yes L97.522 Non-pressure chronic ulcer of other part of left foot with fat layer 06/28/2020 No Yes exposed L97.812 Non-pressure chronic ulcer of other part of right lower leg with fat layer 06/28/2020 No Yes exposed L97.322 Non-pressure chronic ulcer of left ankle with fat layer exposed 06/28/2020 No Yes Z79.01 Long term (current) use of anticoagulants 06/28/2020 No Yes S50.311D Abrasion of right elbow, subsequent encounter  08/16/2020 No Yes Inactive Problems ICD-10 Code Description Active Date Inactive Date I10 Essential (primary) hypertension 06/28/2020 06/28/2020 Z86.718 Personal history of other venous thrombosis  and embolism 06/28/2020 06/28/2020 Resolved Problems Electronic Signature(s) Signed: 08/16/2020 4:35:25 PM By: Linton Ham MD Entered By: Linton Ham on 08/16/2020 13:50:24 [name], [name] ([id number]) -------------------------------------------------------------------------------- Progress Note Details Patient Name: [name] Date of Service: 08/16/2020 1:00 PM Medical Record Number: [id number] Patient Account Number: [id number] Date of Birth/Sex: 1950-12-07 (70 y.o. F) Treating RN: Carlene Coria Primary Care Provider: [name], Olivia Mackie Other Clinician: Referring Provider: [name], Olivia Mackie Treating Provider/Extender: Tito Dine in Treatment: 7 Subjective History of Present Illness (HPI) 06/28/20 upon evaluation today patient appears to be doing unfortunately somewhat poorly in regard to wounds that she has over her lower extremities. She has a wound on the left distal second toe, left posterior heel, and right anterior lower leg. With that being said all in all none of the wounds appear to be too bad the one that hurts her the most is actually the wound on the posterior heel which actually is also one of the smallest. Nonetheless I think there is some callus hiding what is really going on in this area. Fortunately there does not appear to be any obvious evidence of infection which is great news. Patient does have a history of diabetes mellitus type 2, chronic venous insufficiency, hypertension, history of DVTs, and is on long-term anticoagulant therapy, Eliquis. 07/05/20 upon evaluation today patient appears to be doing well currently with regard to her wounds. I feel like she is making progress which is great news and overall there is no signs of active infection at  this time. No fevers, chills, nausea, vomiting, or diarrhea. 4/29; patient has a only a small area remaining on the tip of her left second toe and a small area remaining on the left heel. The area on the right anterior lower leg is healed. They tell me that she had remote podiatric surgery on the toes of the left foot however they are very fixed in position and I wonder whether they are making it difficult to relieve pressure in her foot wear. She is a type II diabetic with neuropathy as well. 08/01/2020 upon evaluation today patient appears to be doing well currently in regard to her wounds. In fact everything appears to be doing much better. The posterior aspect of her heel is actually giving her some trouble not the Achilles area but a small wound that we did not even have marked. In fact I had noted this before. Nonetheless we are going to addressed that today. 5/27; most of the patient's wounds are healed including the left Achilles heel left toe. Right forearm is also healed. She has a new abrasion posteriorly in the right elbow area just above the olecranon this happened today with an abrasion from her car door Objective Constitutional Sitting or standing Blood Pressure is within target range for patient.. Pulse regular and within target range for patient.Marland Kitchen Respirations regular, non- labored and within target range.. Temperature is normal and within the target range for the patient.Marland Kitchen appears in no distress. Vitals Time Taken: 1:22 PM, Height: 65.5 in, Weight: 240 lbs, BMI: 39.3, Temperature: 97.8 F, Pulse: 60 bpm, Respiratory Rate: 18 breaths/min, Blood Pressure: 155/77 mmHg. General Notes: Wound exam; her left Achilles heel and the left heel were carefully examined all of this is healed. Left toe is also healed. Area on the right forearm is also healed. She has a new abrasion/skin tear on the posterior right arm just above the olecranon. This happened today this is a small skin tear as  well Integumentary (Hair,  Skin) Wound #10 status is Open. Original cause of wound was Gradually Appeared. The date acquired was: 07/25/2020. The wound has been in treatment 2 weeks. The wound is located on the Left Calcaneus. The wound measures 0cm length x 0cm width x 0cm depth; 0cm^2 area and 0cm^3 volume. There is no tunneling or undermining noted. There is a none present amount of drainage noted. The wound margin is flat and intact. There is no granulation within the wound bed. There is no necrotic tissue within the wound bed. Wound #11 status is Open. Original cause of wound was Trauma. The date acquired was: 08/16/2020. The wound is located on the Right Elbow. The wound measures 0.2cm length x 1cm width x 0.1cm depth; 0.157cm^2 area and 0.016cm^3 volume. There is no tunneling or undermining noted. There is a medium amount of sanguinous drainage noted. There is no granulation within the wound bed. There is no necrotic tissue within the wound bed. Wound #6 status is Open. Original cause of wound was Pressure Injury. The date acquired was: 01/05/2020. The wound has been in treatment 7 weeks. The wound is located on the Left,Distal Toe Second. The wound measures 0cm length x 0cm width x 0cm depth; 0cm^2 area and 0cm^3 volume. There is no tunneling or undermining noted. There is a none present amount of drainage noted. There is no granulation within the wound bed. There is no necrotic tissue within the wound bed. Wound #7 status is Open. Original cause of wound was Gradually Appeared. The date acquired was: 03/23/2020. The wound has been in treatment 7 weeks. The wound is located on the Left,Posterior Achilles. The wound measures 0cm length x 0cm width x 0cm depth; 0cm^2 area and 0cm^3 volume. There is no tunneling or undermining noted. There is a none present amount of drainage noted. The wound margin is flat and intact. There is no granulation within the wound bed. There is no necrotic tissue within  the wound bed. [name], [name]. ([id number]) Wound #9 status is Open. Original cause of wound was Skin Tear/Laceration. The date acquired was: 07/29/2020. The wound has been in treatment 2 weeks. The wound is located on the Right,Lateral Forearm. The wound measures 0cm length x 0cm width x 0cm depth; 0cm^2 area and 0cm^3 volume. There is no tunneling or undermining noted. There is a none present amount of drainage noted. There is no granulation within the wound bed. There is no necrotic tissue within the wound bed. Assessment Active Problems ICD-10 Type 2 diabetes mellitus with foot ulcer Venous insufficiency (chronic) (peripheral) Non-pressure chronic ulcer of other part of left foot with fat layer exposed Non-pressure chronic ulcer of other part of right lower leg with fat layer exposed Non-pressure chronic ulcer of left ankle with fat layer exposed Long term (current) use of anticoagulants Abrasion of right elbow, subsequent encounter Plan Follow-up Appointments: Return Appointment in 1 week. Home Health: [facility] for wound care. May utilize formulary equivalent dressing for wound treatment orders unless otherwise specified. Home Health Nurse may visit PRN to address patient s wound care needs. [facility] Bathing/ Shower/ Hygiene: May shower; gently cleanse wound with antibacterial soap, rinse and pat dry prior to dressing wounds Edema Control - Lymphedema / Segmental Compressive Device / Other: Elevate, Exercise Daily and Avoid Standing for Long Periods of Time. Elevate legs to the level of the heart and pump ankles as often as possible Elevate leg(s) parallel to the floor when sitting. WOUND #11: - Elbow Wound Laterality: Right Cleanser:  Normal Saline 1 x Per Day/30 Days Discharge Instructions: Wash your hands with soap and water. Remove old dressing, discard into plastic bag and place into trash. Cleanse the wound with Normal Saline prior to applying a clean dressing  using gauze sponges, not tissues or cotton balls. Do not scrub or use excessive force. Pat dry using gauze sponges, not tissue or cotton balls. Cleanser: Soap and Water 1 x Per Day/30 Days Discharge Instructions: Gently cleanse wound with antibacterial soap, rinse and pat dry prior to dressing wounds Primary Dressing: Xeroform-HBD 2x2 (in/in) (DME) (Generic) 1 x Per Day/30 Days Discharge Instructions: Apply Xeroform-HBD 2x2 (in/in) as directed Secondary Dressing: ABD Pad 5x9 (in/in) (DME) (Generic) 1 x Per Day/30 Days Discharge Instructions: Cover with ABD pad Secondary Dressing: Kerlix 4.5 x 4.1 (in/yd) (DME) (Generic) 1 x Per Day/30 Days Discharge Instructions: Apply Kerlix 4.5 x 4.1 (in/yd) as instructed Secured With: 92M Medipore H Soft Cloth Surgical Tape, 2x2 (in/yd) (DME) (Generic) 1 x Per Day/30 Days 1. I repositioned a small area of skin over the skin tear on the right upper arm. I am not sure if this will remain viable. We put Xeroform and gauze over that her husband will change it 2. All of the wounds in the left Achilles left heel left foot are all healed 3. She has chronic venous insufficiency but I do not think her compliance with correct compression stockings is all that high however she does have them. 4. I advised her to continue to offload her heels especially on the left. She expressed understanding as did her husband Engineer, maintenance) Signed: 08/16/2020 4:35:25 PM By: Linton Ham MD Entered By: Linton Ham on 08/16/2020 13:54:00 [name], [name] ([id number]) -------------------------------------------------------------------------------- SuperBill Details Patient Name: [name] Date of Service: 08/16/2020 Medical Record Number: [id number] Patient Account Number: [id number] Date of Birth/Sex: [birth year] (70 y.o. 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 I10 Essential (primary) hypertension Z86.718 Personal history of other venous thrombosis and embolism Z79.01 Long term (current) use of anticoagulants Facility Procedures CPT4 Code: [id number] Description: 99213 - WOUND CARE VISIT-LEV 3 EST PT Modifier: Quantity: 1 Physician Procedures CPT4 Code: 5643329 Description: 99213 - WC PHYS LEVEL 3 - EST PT Modifier: Quantity: 1 CPT4 Code: Description: ICD-10 Diagnosis Description L97.522 Non-pressure chronic ulcer of other part of left foot with fat layer ex L97.322 Non-pressure chronic ulcer of left ankle with fat layer exposed I87.2 Venous insufficiency (chronic) (peripheral) Modifier: posed Quantity: Electronic Signature(s) Signed: 08/16/2020 4:35:25 PM By: Linton Ham MD Entered By: Linton Ham on 08/16/2020 13:54:30

## 2020-08-21 ENCOUNTER — Ambulatory Visit
Admission: RE | Admit: 2020-08-21 | Discharge: 2020-08-21 | Disposition: A | Discharge status: Home / Self Care | Payer: Medicare HMO | Source: Ambulatory Visit | Attending: Internal Medicine | Admitting: Internal Medicine

## 2020-08-21 DIAGNOSIS — Z1231 Encounter for screening mammogram for malignant neoplasm of breast: Secondary | ICD-10-CM | POA: Diagnosis not present

## 2020-08-22 ENCOUNTER — Ambulatory Visit (INDEPENDENT_AMBULATORY_CARE_PROVIDER_SITE_OTHER): Payer: Medicare HMO | Admitting: Internal Medicine

## 2020-08-22 ENCOUNTER — Other Ambulatory Visit: Payer: Self-pay

## 2020-08-22 ENCOUNTER — Encounter: Payer: Self-pay | Admitting: Internal Medicine

## 2020-08-22 ENCOUNTER — Other Ambulatory Visit (HOSPITAL_COMMUNITY)
Admission: RE | Admit: 2020-08-22 | Discharge: 2020-08-22 | Disposition: A | Discharge status: Home / Self Care | Payer: Medicare HMO | Source: Ambulatory Visit | Attending: Internal Medicine | Admitting: Internal Medicine

## 2020-08-22 ENCOUNTER — Other Ambulatory Visit: Payer: Self-pay | Admitting: Urology

## 2020-08-22 VITALS — BP 110/66 | HR 75 | Temp 97.9°F | Ht 65.5 in | Wt 236.0 lb

## 2020-08-22 DIAGNOSIS — R32 Unspecified urinary incontinence: Secondary | ICD-10-CM | POA: Diagnosis not present

## 2020-08-22 DIAGNOSIS — M109 Gout, unspecified: Secondary | ICD-10-CM

## 2020-08-22 DIAGNOSIS — E538 Deficiency of other specified B group vitamins: Secondary | ICD-10-CM

## 2020-08-22 DIAGNOSIS — I639 Cerebral infarction, unspecified: Secondary | ICD-10-CM

## 2020-08-22 DIAGNOSIS — M25512 Pain in left shoulder: Secondary | ICD-10-CM

## 2020-08-22 DIAGNOSIS — Z1329 Encounter for screening for other suspected endocrine disorder: Secondary | ICD-10-CM

## 2020-08-22 DIAGNOSIS — F32A Depression, unspecified: Secondary | ICD-10-CM

## 2020-08-22 DIAGNOSIS — E119 Type 2 diabetes mellitus without complications: Secondary | ICD-10-CM

## 2020-08-22 DIAGNOSIS — E1165 Type 2 diabetes mellitus with hyperglycemia: Secondary | ICD-10-CM | POA: Diagnosis not present

## 2020-08-22 DIAGNOSIS — E559 Vitamin D deficiency, unspecified: Secondary | ICD-10-CM | POA: Diagnosis not present

## 2020-08-22 DIAGNOSIS — N390 Urinary tract infection, site not specified: Secondary | ICD-10-CM | POA: Insufficient documentation

## 2020-08-22 DIAGNOSIS — F419 Anxiety disorder, unspecified: Secondary | ICD-10-CM

## 2020-08-22 DIAGNOSIS — E1159 Type 2 diabetes mellitus with other circulatory complications: Secondary | ICD-10-CM

## 2020-08-22 DIAGNOSIS — I776 Arteritis, unspecified: Secondary | ICD-10-CM

## 2020-08-22 DIAGNOSIS — N952 Postmenopausal atrophic vaginitis: Secondary | ICD-10-CM

## 2020-08-22 DIAGNOSIS — D649 Anemia, unspecified: Secondary | ICD-10-CM | POA: Diagnosis not present

## 2020-08-22 DIAGNOSIS — N760 Acute vaginitis: Secondary | ICD-10-CM | POA: Diagnosis not present

## 2020-08-22 DIAGNOSIS — E039 Hypothyroidism, unspecified: Secondary | ICD-10-CM

## 2020-08-22 DIAGNOSIS — I152 Hypertension secondary to endocrine disorders: Secondary | ICD-10-CM | POA: Diagnosis not present

## 2020-08-22 DIAGNOSIS — Z9181 History of falling: Secondary | ICD-10-CM

## 2020-08-22 DIAGNOSIS — G8929 Other chronic pain: Secondary | ICD-10-CM

## 2020-08-22 DIAGNOSIS — J4540 Moderate persistent asthma, uncomplicated: Secondary | ICD-10-CM

## 2020-08-22 DIAGNOSIS — J309 Allergic rhinitis, unspecified: Secondary | ICD-10-CM

## 2020-08-22 DIAGNOSIS — R269 Unspecified abnormalities of gait and mobility: Secondary | ICD-10-CM

## 2020-08-22 DIAGNOSIS — F418 Other specified anxiety disorders: Secondary | ICD-10-CM

## 2020-08-22 MED ORDER — MONTELUKAST SODIUM 10 MG PO TABS: 1.0000 | ORAL_TABLET | Freq: Every day | ORAL | 3 refills | Status: DC

## 2020-08-22 MED ORDER — ESTRADIOL 0.1 MG/GM VA CREA: 1.0000 | TOPICAL_CREAM | VAGINAL | 11 refills | Status: DC

## 2020-08-22 MED ORDER — METFORMIN HCL 500 MG PO TABS: 500.0000 mg | ORAL_TABLET | Freq: Every day | ORAL | 3 refills | Status: DC

## 2020-08-22 MED ORDER — LEVOCETIRIZINE DIHYDROCHLORIDE 5 MG PO TABS
5.0000 mg | ORAL_TABLET | Freq: Every evening | ORAL | 3 refills | Status: DC | PRN
Start: 2020-08-22 — End: 2021-09-24

## 2020-08-22 MED ORDER — BUPROPION HCL ER (XL) 150 MG PO TB24: 150.0000 mg | ORAL_TABLET | Freq: Every day | ORAL | 3 refills | Status: DC

## 2020-08-22 MED ORDER — ALBUTEROL SULFATE (2.5 MG/3ML) 0.083% IN NEBU: 2.5000 mg | INHALATION_SOLUTION | RESPIRATORY_TRACT | 12 refills | Status: DC | PRN

## 2020-08-22 MED ORDER — SITAGLIPTIN PHOSPHATE 25 MG PO TABS: 25.0000 mg | ORAL_TABLET | Freq: Every day | ORAL | 3 refills | Status: DC

## 2020-08-22 MED ORDER — LEVOTHYROXINE SODIUM 175 MCG PO TABS: 175.0000 ug | ORAL_TABLET | Freq: Every day | ORAL | 3 refills | Status: DC

## 2020-08-22 MED ORDER — ADVAIR HFA 115-21 MCG/ACT IN AERO: 2.0000 | INHALATION_SPRAY | Freq: Every day | RESPIRATORY_TRACT | 12 refills | Status: DC

## 2020-08-22 MED ORDER — COLCHICINE 0.6 MG PO TABS: 0.6000 mg | ORAL_TABLET | ORAL | 2 refills | Status: DC | PRN

## 2020-08-22 MED ORDER — DULOXETINE HCL 60 MG PO CPEP: 60.0000 mg | ORAL_CAPSULE | Freq: Every day | ORAL | 3 refills | Status: DC

## 2020-08-22 MED ORDER — ALPRAZOLAM 0.5 MG PO TABS: ORAL_TABLET | ORAL | 5 refills | Status: DC

## 2020-08-22 NOTE — Progress Notes (Signed)
Prescription for vaginal estrogen cream sent to pharmacy.

## 2020-08-22 NOTE — Patient Instructions (Addendum)
MD Physician    Primary Contact Information  Phone Fax E-mail Address  [phone] Not available [place] [address]     Specialties     Obstetrics and Gynecology       Dr. Sherlene Shams

## 2020-08-22 NOTE — Progress Notes (Signed)
Chief Complaint  Patient presents with  . Follow-up   F/u with husband [name]  1. Having left foot and shoulder pain but seeing wound care, ortho and podiatry pain is 7/10, chronic pain pain clinic will no longer Rx lyrica and wants PCP offices to do this sent in  At times husband states she is confused about meds though has pill organizer offered bubble/pill packing with [facility] to pt she wants to think about it and will let me know   2. UI at times does not make it to the toilet using portable toilet, recurrent UTI and vaginal odor wants to change urology clinic did not like how she was tx'ed at [facility] and will need refill of estrace and other urologic meds sent in by urology  3. H/o Multiple strokes on eliquis 5 mg bid and frequent falls at home uses lift chair but doing better after rehab and now has [facility] of [facility] coming to her home and she likes [facility] better than [facility] -see labs below 04/08/20 [facility] staty neurology in hospital rec rheum to work up cause of strokes as well as neurology f/u  Vasculitis profile 04/08/20 ANA by IFA homegenous 1:160 titer, nucleolar 1:80 speckled 1:160, RDL 18   4. htn controlled on toprol 25 mg qd dm 2 on metformin 500 mg qd, januvia 25 mg qd  Labs 04/08/20 hospitalization [facility] ds DNA Ab 0 - 9 IU/mL <1  Comment: (NOTE)                   Negative   <5                   Equivocal 5 - 9                   Positive   >9  Ribonucleic Protein 0.0 - 0.9 AI 0.2  ENA SM Ab Ser-aCnc 0.0 - 0.9 AI <0.2  Scleroderma (Scl-70) (ENA) Antibody, IgG 0.0 - 0.9 AI <0.2  SSA (Ro) (ENA) Antibody, IgG 0.0 - 0.9 AI <0.2  SSB (La) (ENA) Antibody, IgG 0.0 - 0.9 AI <0.2  Chromatin Ab SerPl-aCnc 0.0 - 0.9 AI <0.2  Anti JO-1 0.0 - 0.9 AI <0.2  Centromere Ab Screen 0.0 - 0.9 AI <0.2  See below:  Comment  Comment: (NOTE)  Autoantibody            Disease Association   ------------------------------------------------------------             Condition         Frequency  ---------------------  ------------------------  ---------  Antinuclear Antibody,  SLE, mixed connective  Direct (ANA-D)      tissue diseases  ---------------------  ------------------------  ---------  dsDNA          SLE            40 - 60%  ---------------------  ------------------------  ---------  Chromatin        Drug induced SLE        90%              SLE            48 - 97%  ---------------------  ------------------------  ---------  SSA (Ro)         SLE            25 - 35%              Sjogren's Syndrome  40 - 70%              Neonatal Lupus         100%  ---------------------  ------------------------  ---------  SSB (La)         SLE              10%              Sjogren's Syndrome       30%  ---------------------  -----------------------  ---------  Sm (anti-Smith)     SLE            15 - 30%  ---------------------  -----------------------  ---------  RNP           Mixed Connective Tissue              Disease             95%  (U1 nRNP,        SLE            30 - 50%  anti-ribonucleoprotein) Polymyositis and/or              Dermatomyositis         20%  ---------------------  ------------------------  ---------  Scl-70 (antiDNA     Scleroderma (diffuse)   20 - 35%  topoisomerase)      Crest              13%  ---------------------  ------------------------  ---------  Jo-1           Polymyositis and/or              Dermatomyositis      20 - 40%  ---------------------  ------------------------  ---------  Centromere B        Scleroderma -Crest              variant             80%  Performed At: [facility]  [place], [place] [id number]  Rush Farmer MD [id number]   Sed Rate 0 - 30 mm/hr 60 High     dRVVT Mix 0.0 - 40.4 sec 54.1High  Comment: (NOTE)  Performed At: [facility]  [place], [place] [id number]  Rush Farmer MD [id number]   dRVVT Confirm 0.8 - 1.2 ratio 0.8  Comment: (NOTE)  Performed At: [facility]  [place], [place] [id number]  Rush Farmer MD [id number]   PTT Lupus Anticoagulant 0.0 - 51.9 sec 36.0  DRVVT 0.0 - 47.0 sec 74.7High  Lupus Anticoag Interp  Comment: VC  Comment: (NOTE)  No lupus anticoagulant was detected. These results are consistent with  specific inhibitors to one or more common pathway factors (X, V, II or  fibrinogen).  Performed At: [facility]  [place], [place] [id number]  Rush Farmer MD YI:[id number]   CRP <1.0 mg/dL 3.1 High   7.0 High  CM  8.8 High  CM  14.7 High    Review of Systems  Constitutional: Negative for weight loss.  HENT: Negative for hearing loss.   Eyes: Negative for blurred vision.  Respiratory: Negative for shortness of breath.   Cardiovascular: Negative for chest pain.  Gastrointestinal: Negative for abdominal pain.  Genitourinary: Positive for dysuria.       +vaginal odor and incontinence    Musculoskeletal: Positive for back pain, falls and joint pain.  Skin: Negative for rash.  Psychiatric/Behavioral:  The patient is nervous/anxious.    Past Medical History:  Diagnosis Date  . Abnormal antibody titer   . Anxiety and depression   . Arthritis   . Asthma   . Cystocele   . Depression   . Diabetes ([place])   . DVT (deep venous thrombosis) (HCC)    Righ calf  . Dyspnea    11/08/2018 - "more now due to lack of exercise"  . Dysrhythmia    afib  . Edema   . GERD (gastroesophageal reflux  disease)   . Heart murmur    Echocardiogram June 2015: Mild MR (possible vegetation seen on TTE, not seen on TEE), normal LV size with moderate concentric LVH. Normal function EF 60-65%. Normal diastolic function. Mild LA dilation.  Marland Kitchen History of IBS   . History of kidney stones   . History of methicillin resistant staphylococcus aureus (MRSA) 2008  . Hyperlipidemia   . Hypertension   . Hypothyroidism   . Insomnia   . Kidney stone    kidney stones with lithotripsy .  [facility]- "adrenal glands"  . Neuropathy involving both lower extremities   . Obesity, Class II, BMI 35-39.9, with comorbidity   . Paroxysmal atrial fibrillation ([place]) 2007   In June 2015, Cardiac Event Monitor: Mostly SR/sinus arrhythmia with PVCs that are frequent. Short bursts of A. fib lasting several minutes;; CHA2DS2-VASc Score = 5 (age, Female, PVD, DM, HTN)  . Peripheral vascular disease ([place])   . Sleep apnea    use C-PAP  . Urinary incontinence   . Venous stasis dermatitis of both lower extremities    Past Surgical History:  Procedure Laterality Date  . ANKLE SURGERY Right   . APPLICATION OF WOUND VAC Left 06/27/2015   Procedure: APPLICATION OF WOUND VAC ( POSSIBLE ) ;  Surgeon: [name], MD;  Location: [facility] ORS;  Service: Vascular;  Laterality: Left;  . APPLICATION OF WOUND VAC Left 09/25/2016   Procedure: APPLICATION OF WOUND VAC;  Surgeon: [name], DPM;  Location: [facility];  Service: Podiatry;  Laterality: Left;  . arm surgery     right    . CHOLECYSTECTOMY    . COLONOSCOPY    . COLONOSCOPY WITH PROPOFOL N/A 01/11/2017   Procedure: COLONOSCOPY WITH PROPOFOL;  Surgeon: [name], MD;  Location: [facility] ENDOSCOPY;  Service: Endoscopy;  Laterality: N/A;  . COLONOSCOPY WITH PROPOFOL N/A 02/22/2017   Procedure: COLONOSCOPY WITH PROPOFOL;  Surgeon: [name], MD;  Location: [facility] ENDOSCOPY;  Service: Endoscopy;  Laterality: N/A;  . COLONOSCOPY WITH PROPOFOL N/A 05/31/2017   Procedure:  COLONOSCOPY WITH PROPOFOL;  Surgeon: [name], MD;  Location: [facility] ENDOSCOPY;  Service: Endoscopy;  Laterality: N/A;  . ESOPHAGOGASTRODUODENOSCOPY (EGD) WITH PROPOFOL N/A 01/11/2017   Procedure: ESOPHAGOGASTRODUODENOSCOPY (EGD) WITH PROPOFOL;  Surgeon: [name], MD;  Location: [facility] ENDOSCOPY;  Service: Endoscopy;  Laterality: N/A;  . ESOPHAGOGASTRODUODENOSCOPY (EGD) WITH PROPOFOL N/A 10/25/2018   Procedure: ESOPHAGOGASTRODUODENOSCOPY (EGD) WITH PROPOFOL;  Surgeon: [name], MD;  Location: [facility] ENDOSCOPY;  Service: Endoscopy;  Laterality: N/A;  . ESOPHAGOGASTRODUODENOSCOPY (EGD) WITH PROPOFOL N/A 11/29/2019   Procedure: ESOPHAGOGASTRODUODENOSCOPY (EGD) WITH PROPOFOL;  Surgeon: Toledo, Benay Pike, MD;  Location: [facility] ENDOSCOPY;  Service: Gastroenterology;  Laterality: N/A;  . HERNIA REPAIR     umbilical  . HIATAL HERNIA REPAIR    . I & D EXTREMITY Left 06/27/2015   Procedure: IRRIGATION AND DEBRIDEMENT EXTREMITY            (  CALF HEMATOMA ) POSSIBLE WOUND VAC;  Surgeon: [name], MD;  Location: [facility] ORS;  Service: Vascular;  Laterality: Left;  . INCISION AND DRAINAGE OF WOUND Left 09/25/2016   Procedure: IRRIGATION AND DEBRIDEMENT - PARTIAL RESECTION OF ACHILLES TENDON WITH WOUND VAC APPLICATION;  Surgeon: [name], DPM;  Location: [facility] ORS;  Service: Podiatry;  Laterality: Left;  . INCISION AND DRAINAGE OF WOUND Left 11/09/2018   Procedure: Excision of left foot wound with ACell placement;  Surgeon: Wallace Going, DO;  Location: [place];  Service: Plastics;  Laterality: Left;  . IRRIGATION AND DEBRIDEMENT ABSCESS Left 09/07/2016   Procedure: IRRIGATION AND DEBRIDEMENT ABSCESS LEFT HEEL;  Surgeon: [name], DPM;  Location: [facility];  Service: Podiatry;  Laterality: Left;  . JOINT REPLACEMENT  2013   left knee replacement  . KIDNEY SURGERY Right    kidney stones  . LITHOTRIPSY    . LOWER EXTREMITY ANGIOGRAPHY Left 04/04/2018   Procedure: LOWER EXTREMITY  ANGIOGRAPHY;  Surgeon: [name], MD;  Location: [place] CV LAB;  Service: Cardiovascular;  Laterality: Left;  . NM GATED MYOVIEW ([place] HX)  February 2017   Likely breast attenuation. LOW RISK study. Normal EF 55-60%.  . OTHER SURGICAL HISTORY     08/2016 or 09/2016 surgery on achilles tenso h/o staph infection heal. Dr. [name]  . removal of left hematoma Left    leg  . REVERSE SHOULDER ARTHROPLASTY Right 10/08/2015   Procedure: REVERSE SHOULDER ARTHROPLASTY;  Surgeon: [name], MD;  Location: [facility] OR[facility];  Service: Orthopedics;  Laterality: Right;  . SLEEVE GASTROPLASTY    . TONSILLECTOMY    . TOTAL KNEE ARTHROPLASTY Left   . TOTAL SHOULDER REPLACEMENT Right   . TRANSESOPHAGEAL ECHOCARDIOGRAM  08/08/2013   Mild LVH, EF 60-65%. Moderate LA dilation and mild RA dilation. Mild MR with no evidence of stenosis and no evidence of endocarditis. A false chordae is noted.  . TRANSTHORACIC ECHOCARDIOGRAM  08/03/2013   Mild-Moderate concentric LVH, EF 60-65%. Normal diastolic function. Mild LA dilation. Mild MR with possible vegetation  - not confirmed on TEE   . ULNAR NERVE TRANSPOSITION Right 08/06/2016   Procedure: ULNAR NERVE DECOMPRESSION/TRANSPOSITION;  Surgeon: [name], MD;  Location: [facility] OR[facility];  Service: Orthopedics;  Laterality: Right;  . UPPER GI ENDOSCOPY    . VAGINAL HYSTERECTOMY     Family History  Problem Relation Age of Onset  . Skin cancer Father   . Diabetes Father   . Hypertension Father   . Peripheral vascular disease Father   . Cancer Father   . Cerebral aneurysm Father   . Alcohol abuse Father   . Varicose Veins Mother   . Kidney disease Mother   . Arthritis Mother   . Mental illness Sister   . Cancer Maternal Aunt        breast  . Breast cancer Maternal Aunt   . Arthritis Maternal Grandmother   . Hypertension Maternal Grandmother   . Diabetes Maternal Grandmother   . Arthritis Maternal Grandfather   . Heart disease Maternal Grandfather   .  Hypertension Maternal Grandfather   . Arthritis Paternal Grandmother   . Hypertension Paternal Grandmother   . Diabetes Paternal Grandmother   . Arthritis Paternal Grandfather   . Hypertension Paternal Grandfather   . Bladder Cancer Neg Hx   . Kidney cancer Neg Hx    Social History   Socioeconomic History  . Marital status: Married    Spouse name: Not on  file  . Number of children: Not on file  . Years of education: Not on file  . Highest education level: Not on file  Occupational History  . Not on file  Tobacco Use  . Smoking status: Never Smoker  . Smokeless tobacco: Never Used  Vaping Use  . Vaping Use: Never used  Substance and Sexual Activity  . Alcohol use: No    Alcohol/week: 0.0 standard drinks  . Drug use: Never  . Sexual activity: Yes  Other Topics Concern  . Not on file  Social History Narrative   She is currently married -- for 30+ years. Does not work. Does not smoke or take alcohol. She never smoked. She exercises at least 3 days a week since before her gastric surgery.   Marital status reviewed in history of present illness.   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 Difficulty of Paying Living Expenses: Not very hard  Food Insecurity: No Food Insecurity  . Worried About Charity fundraiser in the Last Year: Never true  . Ran Out of Food in the Last Year: Never true  Transportation Needs: Unmet Transportation Needs  . Lack of Transportation (Medical): Yes  . Lack of Transportation (Non-Medical): Yes  Physical Activity: Not on file  Stress: Not on file  Social Connections: Not on file  Intimate Partner Violence: Not on file   Current Meds  Medication Sig  . acetaminophen (TYLENOL) 325 MG tablet Take 2 tablets (650 mg total) by mouth every 6 (six) hours as needed for mild pain or moderate pain (or Fever >/= 101).  Marland Kitchen  albuterol (PROVENTIL) (2.5 MG/3ML) 0.083% nebulizer solution Take 2.5 mg by nebulization every 4 (four) hours as needed for wheezing or shortness of breath.  . ALPRAZolam (XANAX) 0.5 MG tablet TAKE 1/2 TABLET BY MOUTH TWICE DAILY AS NEEDED FOR ANXIETY  . apixaban (ELIQUIS) 5 MG TABS tablet Take 1 tablet (5 mg total) by mouth 2 (two) times daily.  Marland Kitchen buPROPion (WELLBUTRIN XL) 150 MG 24 hr tablet Take 1 tablet (150 mg total) by mouth daily.  . calcium-vitamin D (OSCAL WITH D) 500-200 MG-UNIT tablet Take 1 tablet by mouth 2 (two) times daily with a meal.  . cholecalciferol (VITAMIN D) 25 MCG tablet Take 1 tablet (1,000 Units total) by mouth 2 (two) times daily with a meal.  . clobetasol cream (TEMOVATE) 0.01 % Apply 1 application topically 2 (two) times daily as needed (skin irritation).   . colchicine 0.6 MG tablet Take 1 tablet (0.6 mg total) by mouth as needed. May repeat 1 pill in 1 hr. No more than 3 total pills 1.8 mg in 24 hours as needed gout flare  . diclofenac Sodium (VOLTAREN) 1 % GEL Apply 2 g topically 3 (three) times daily.  . ergocalciferol (VITAMIN D2) 1.25 MG (50000 UT) capsule Take 50,000 Units by mouth once a week.  . fesoterodine (TOVIAZ) 8 MG TB24 tablet Take 1 tablet (8 mg total) by mouth daily.  . fluticasone-salmeterol (ADVAIR HFA) 115-21 MCG/ACT inhaler Inhale 2 puffs into the lungs daily. Rinse mouth thoroughly after use  . folic acid (FOLVITE) 1 MG tablet Take 1 tablet (1 mg total) by mouth daily.  Marland Kitchen HYDROcodone-acetaminophen (NORCO/VICODIN) 5-325 MG tablet Take 1 tablet by mouth every 8 (eight) hours as needed for severe pain. Must  last 30 days  . [START ON 08/30/2020] HYDROcodone-acetaminophen (NORCO/VICODIN) 5-325 MG tablet Take 1 tablet by mouth every 8 (eight) hours as needed for severe pain. Must last 30 days  . [START ON 09/29/2020] HYDROcodone-acetaminophen (NORCO/VICODIN) 5-325 MG tablet Take 1 tablet by mouth every 8 (eight) hours as needed for severe pain. Must last 30  days  . levocetirizine (XYZAL) 5 MG tablet Take 5 mg by mouth at bedtime as needed for allergies.  Marland Kitchen levothyroxine (SYNTHROID) 175 MCG tablet Take 1 tablet (175 mcg total) by mouth daily before breakfast.  . metFORMIN (GLUCOPHAGE) 500 MG tablet Take 1 tablet (500 mg total) by mouth daily with breakfast. With food  . metoprolol succinate (TOPROL XL) 25 MG 24 hr tablet Take 1 tablet (25 mg total) by mouth daily.  . Misc. Devices (TUB TRANSFER BOARD) MISC 1 Device by Does not apply route as needed.  . montelukast (SINGULAIR) 10 MG tablet TAKE 1 TABLET BY MOUTH AT BEDTIME  . Multiple Vitamin (MULTIVITAMIN WITH MINERALS) TABS tablet Take 1 tablet by mouth daily.  . ondansetron (ZOFRAN) 4 MG tablet Take 1 tablet (4 mg total) by mouth every 8 (eight) hours as needed for nausea or vomiting.  Marland Kitchen oxybutynin (DITROPAN XL) 15 MG 24 hr tablet Take 1 tablet (15 mg total) by mouth at bedtime.  . pantoprazole (PROTONIX) 40 MG tablet Take 40 mg by mouth daily.   . pregabalin (LYRICA) 50 MG capsule Take 1 capsule (50 mg total) by mouth 3 (three) times daily.  . sitaGLIPtin (JANUVIA) 25 MG tablet Take 1 tablet (25 mg total) by mouth daily.  . sucralfate (CARAFATE) 1 GM/10ML suspension Take 1 g by mouth 3 (three) times daily.   Marland Kitchen thiamine 100 MG tablet Take 0.5 tablets (50 mg total) by mouth 2 (two) times daily with a meal.  . vitamin B-12 1000 MCG tablet Take 1 tablet (1,000 mcg total) by mouth daily.  . [DISCONTINUED] estradiol (ESTRACE) 0.1 MG/GM vaginal cream Place 1 Applicatorful vaginally every Monday, Wednesday, and Friday. Apply 0.5mg  (pea-sized amount)  just inside the vaginal introitus with a finger-tip on Monday, Wednesday and Friday nights,   Allergies  Allergen Reactions  . Morphine And Related Other (See Comments) and Nausea Only    Patient becomes very confused Other reaction(s): Other (See Comments), Other (See Comments) Patient becomes very confused Patient becomes very confused   . Penicillins  Hives, Shortness Of Breath, Swelling and Other (See Comments)    Facial swelling Has patient had a PCN reaction causing immediate rash, facial/tongue/throat swelling, SOB or lightheadedness with hypotension: Yes Has patient had a PCN reaction causing severe rash involving mucus membranes or skin necrosis: Yes Has patient had a PCN reaction that required hospitalization No Has patient had a PCN reaction occurring within the last 10 years: No If all of the above answers are "NO", then may proceed with Cephalosporin use.  Other reaction(s): Other (See Comments), Other (See Comments) Facial swelling Has patient had a PCN reaction causing immediate rash, facial/tongue/throat swelling, SOB or lightheadedness with hypotension: Yes Has patient had a PCN reaction causing severe rash involving mucus membranes or skin necrosis: Yes Has patient had a PCN reaction that required hospitalization No Has patient had a PCN reaction occurring within the last 10 years: No If all of the above answers are "NO", then may proceed with Cephalosporin use. Facial swelling Has patient had a PCN reaction causing immediate rash, facial/tongue/throat swelling, SOB or lightheadedness with hypotension: Yes Has patient had a  PCN reaction causing severe rash involving mucus membranes or skin necrosis: Yes Has patient had a PCN reaction that required hospitalization No Has patient had a PCN reaction occurring within the last 10 years: No If all of the above answers are "NO", then may proceed with Cephalosporin use. Facial swelling Has patient had a PCN reaction causing immediate rash, facial/tongue/throat swelling, SOB or lightheadedness with hypotension: Yes Has patient had a PCN reaction causing severe rash involving mucus membranes or skin necrosis: Yes Has patient had a PCN reaction that required hospitalization No Has patient had a PCN reaction occurring within the last 10 years: No If all of the above answers are "NO",  then may proceed with Cephalosporin use.   . Ambien [Zolpidem]     Hallucination   . Aspirin Hives  . Oxytetracycline Hives    Other reaction(s): Unknown    Recent Results (from the past 2160 hour(s))  Bladder Scan (Post Void Residual) in office     Status: None   Collection Time: 08/20/20  1:20 PM  Result Value Ref Range   Scan Result 0    Objective  Body mass index is 38.68 kg/m. Wt Readings from Last 3 Encounters:  08/22/20 236 lb (107 kg)  08/20/20 236 lb (107 kg)  08/20/20 240 lb (108.9 kg)   Temp Readings from Last 3 Encounters:  08/22/20 97.9 F (36.6 C) (Oral)  07/15/20 (!) 97.2 F (36.2 C) (Temporal)  04/15/20 97.8 F (36.6 C)   BP Readings from Last 3 Encounters:  08/22/20 110/66  08/20/20 108/62  08/20/20 106/72   Pulse Readings from Last 3 Encounters:  08/22/20 75  08/20/20 (!) 54  08/20/20 (!) 51    Physical Exam Vitals and nursing note reviewed.  Constitutional:      Appearance: Normal appearance. She is well-developed and well-groomed. She is obese.  HENT:     Head: Normocephalic and atraumatic.  Eyes:     Conjunctiva/sclera: Conjunctivae normal.     Pupils: Pupils are equal, round, and reactive to light.  Cardiovascular:     Rate and Rhythm: Normal rate and regular rhythm.     Heart sounds: Normal heart sounds. No murmur heard.   Pulmonary:     Effort: Pulmonary effort is normal.     Breath sounds: Normal breath sounds.  Skin:    General: Skin is warm and moist.  Neurological:     General: No focal deficit present.     Mental Status: She is alert and oriented to person, place, and time.     Gait: Gait abnormal.     Comments: rollator today  Psychiatric:        Attention and Perception: Attention and perception normal.        Mood and Affect: Mood and affect normal.        Speech: Speech normal.        Behavior: Behavior normal. Behavior is cooperative.        Thought Content: Thought content normal.        Cognition and Memory:  Cognition and memory normal.        Judgment: Judgment normal.     Assessment  Plan  Hypertension controlled associated with diabetes ([place]) 04/07/20 6.7 - Plan: Comprehensive metabolic panel, CBC with Differential/Platelet, Hemoglobin A1c toprol 25 mg qd dm 2 on metformin 500 mg qd, januvia 25 mg qd   Recurrent UTI - Plan: Urine cytology ancillary only([place]), Urinalysis, Routine w reflex microscopic, Urine Culture, Ambulatory referral to  Urogynecology Dr. Sherlene Shams   Acute vaginitis - Plan: Urine cytology ancillary only([place]), Urine Culture  Urinary incontinence, unspecified type - Plan: Ambulatory referral to Urogynecology  Vaginal atrophy - Plan: Ambulatory referral to Urogynecology Current urology refilled estrace   Anemia, unspecified type - Plan: CBC with Differential/Platelet, Iron, TIBC and Ferritin Panel  Folate deficiency - Plan: Folate B12 deficiency - Plan: Vitamin B12 Taking both orally   Vitamin D deficiency - Plan: Vitamin D (25 hydroxy) Taking otc orally   Abnormal gait with rollator chronically and now lift chair at home  High risk falls Given Rx lift chair today  Chronic left shoulder pain F/u ortho  Recurrent stroke  H/o Multiple strokes on eliquis 5 mg bid and frequent falls at home uses lift chair but doing better after rehab and now [facility] her home and she likes [facility] better than [facility] -see labs below 04/08/20 [facility] hospital staty neurology in hospital rec rheum to work up cause of strokes as well as neurology f/u  Vasculitis profile 04/08/20 ANA by IFA homegenous 1:160 titer, nucleolar 1:80 speckled 1:160, RDL 18   Labs 04/08/20 hospitalization [facility] ds DNA Ab 0 - 9 IU/mL <1  Comment: (NOTE)                   Negative   <5                   Equivocal 5 - 9                   Positive   >9  Ribonucleic Protein 0.0 - 0.9 AI 0.2  ENA  SM Ab Ser-aCnc 0.0 - 0.9 AI <0.2  Scleroderma (Scl-70) (ENA) Antibody, IgG 0.0 - 0.9 AI <0.2  SSA (Ro) (ENA) Antibody, IgG 0.0 - 0.9 AI <0.2  SSB (La) (ENA) Antibody, IgG 0.0 - 0.9 AI <0.2  Chromatin Ab SerPl-aCnc 0.0 - 0.9 AI <0.2  Anti JO-1 0.0 - 0.9 AI <0.2  Centromere Ab Screen 0.0 - 0.9 AI <0.2  See below:  Comment  Comment: (NOTE)  Autoantibody            Disease Association  ------------------------------------------------------------             Condition         Frequency  ---------------------  ------------------------  ---------  Antinuclear Antibody,  SLE, mixed connective  Direct (ANA-D)      tissue diseases  ---------------------  ------------------------  ---------  dsDNA          SLE            40 - 60%  ---------------------  ------------------------  ---------  Chromatin        Drug induced SLE        90%              SLE            48 - 97%  ---------------------  ------------------------  ---------  SSA (Ro)         SLE            25 - 35%              Sjogren's Syndrome     40 - 70%              Neonatal Lupus         100%  ---------------------  ------------------------  ---------  SSB (La)  SLE              10%              Sjogren's Syndrome       30%  ---------------------  -----------------------  ---------  Sm (anti-Smith)     SLE            15 - 30%  ---------------------  -----------------------  ---------  RNP           Mixed Connective Tissue              Disease             95%  (U1 nRNP,        SLE            30 - 50%  anti-ribonucleoprotein) Polymyositis and/or              Dermatomyositis         20%  ---------------------   ------------------------  ---------  Scl-70 (antiDNA     Scleroderma (diffuse)   20 - 35%  topoisomerase)      Crest              13%  ---------------------  ------------------------  ---------  Jo-1           Polymyositis and/or              Dermatomyositis      20 - 40%  ---------------------  ------------------------  ---------  Centromere B       Scleroderma -Crest              variant             80%  Performed At: [facility]  [place] [id number]  Rush Farmer MD PP:[id number]   Sed Rate 0 - 30 mm/hr 60 High     dRVVT Mix 0.0 - 40.4 sec 54.1High  Comment: (NOTE)  Performed At: [facility]  [place], [place] [id number]  Rush Farmer MD SN:[id number]   dRVVT Confirm 0.8 - 1.2 ratio 0.8  Comment: (NOTE)  Performed At: [facility]  [place], [place] [id number]  Rush Farmer MD BD:[id number]   PTT Lupus Anticoagulant 0.0 - 51.9 sec 36.0  DRVVT 0.0 - 47.0 sec 74.7High  Lupus Anticoag Interp  Comment: VC  Comment: (NOTE)  No lupus anticoagulant was detected. These results are consistent with  specific inhibitors to one or more common pathway factors (X, V, II or  fibrinogen).  Performed At: [facility]  [place], [place] [id number]  Rush Farmer MD NL:[id number]   CRP <1.0 mg/dL 3.1 High   7.0 High  CM  8.8 High  CM  14.7 High     Of note refilled all chronic meds today   HM Flu shotutd utdpna 23, prevnar Tdaputd considershingrix in futurerx today for this 08/22/20   Given Rx twinrixin pastto get at pharmacy h/o fatty liver covid had 3/3 pfizer consider booster  DEXA 10/17/14 normal  Out of age windowpapOb/gyn  mammo neg5/20/19 negative -referred previously5/27/21 negative 08/21/20 normal  05/31/17 [facility] with diverticulosis and  polypsmultiple tubular and hyperplastic due in 2023  EGD had 10/25/2018 repeat due in 1 year [facility] GI Dr. Gustavo Lah   Provider: Dr. Olivia Mackie [name]-Internal Medicine

## 2020-08-23 ENCOUNTER — Ambulatory Visit: Payer: Medicare HMO | Admitting: Physician Assistant

## 2020-08-23 DIAGNOSIS — I872 Venous insufficiency (chronic) (peripheral): Secondary | ICD-10-CM | POA: Diagnosis not present

## 2020-08-23 DIAGNOSIS — L97522 Non-pressure chronic ulcer of other part of left foot with fat layer exposed: Secondary | ICD-10-CM | POA: Diagnosis not present

## 2020-08-23 DIAGNOSIS — I4819 Other persistent atrial fibrillation: Secondary | ICD-10-CM | POA: Diagnosis not present

## 2020-08-23 DIAGNOSIS — E11621 Type 2 diabetes mellitus with foot ulcer: Secondary | ICD-10-CM | POA: Diagnosis not present

## 2020-08-23 DIAGNOSIS — E1142 Type 2 diabetes mellitus with diabetic polyneuropathy: Secondary | ICD-10-CM | POA: Diagnosis not present

## 2020-08-23 DIAGNOSIS — L97422 Non-pressure chronic ulcer of left heel and midfoot with fat layer exposed: Secondary | ICD-10-CM | POA: Diagnosis not present

## 2020-08-23 DIAGNOSIS — E1151 Type 2 diabetes mellitus with diabetic peripheral angiopathy without gangrene: Secondary | ICD-10-CM | POA: Diagnosis not present

## 2020-08-23 DIAGNOSIS — E1159 Type 2 diabetes mellitus with other circulatory complications: Secondary | ICD-10-CM | POA: Diagnosis not present

## 2020-08-23 DIAGNOSIS — I152 Hypertension secondary to endocrine disorders: Secondary | ICD-10-CM | POA: Diagnosis not present

## 2020-08-23 DIAGNOSIS — I69319 Unspecified symptoms and signs involving cognitive functions following cerebral infarction: Secondary | ICD-10-CM | POA: Diagnosis not present

## 2020-08-23 LAB — TSH: TSH: 0.1 u[IU]/mL — ABNORMAL LOW (ref 0.35–4.50)

## 2020-08-23 LAB — IRON,TIBC AND FERRITIN PANEL
%SAT: 11 % (calc) — ABNORMAL LOW (ref 16–45)
Ferritin: 18 ng/mL (ref 16–288)
Iron: 43 ug/dL — ABNORMAL LOW (ref 45–160)
TIBC: 394 mcg/dL (calc) (ref 250–450)

## 2020-08-23 LAB — CBC WITH DIFFERENTIAL/PLATELET
Basophils Absolute: 0.1 10*3/uL (ref 0.0–0.1)
Basophils Relative: 1.1 % (ref 0.0–3.0)
Eosinophils Absolute: 0.2 10*3/uL (ref 0.0–0.7)
Eosinophils Relative: 2.9 % (ref 0.0–5.0)
HCT: 34.5 % — ABNORMAL LOW (ref 36.0–46.0)
Hemoglobin: 11.1 g/dL — ABNORMAL LOW (ref 12.0–15.0)
Lymphocytes Relative: 16.2 % (ref 12.0–46.0)
Lymphs Abs: 1.2 10*3/uL (ref 0.7–4.0)
MCHC: 32.2 g/dL (ref 30.0–36.0)
MCV: 80.9 fl (ref 78.0–100.0)
Monocytes Absolute: 0.6 10*3/uL (ref 0.1–1.0)
Monocytes Relative: 7.9 % (ref 3.0–12.0)
Neutro Abs: 5.4 10*3/uL (ref 1.4–7.7)
Neutrophils Relative %: 71.9 % (ref 43.0–77.0)
Platelets: 257 10*3/uL (ref 150.0–400.0)
RBC: 4.26 Mil/uL (ref 3.87–5.11)
RDW: 18.6 % — ABNORMAL HIGH (ref 11.5–15.5)
WBC: 7.5 10*3/uL (ref 4.0–10.5)

## 2020-08-23 LAB — VITAMIN D 25 HYDROXY (VIT D DEFICIENCY, FRACTURES): VITD: 35.97 ng/mL (ref 30.00–100.00)

## 2020-08-23 LAB — HEMOGLOBIN A1C: Hgb A1c MFr Bld: 8.1 % — ABNORMAL HIGH (ref 4.6–6.5)

## 2020-08-23 LAB — VITAMIN B12: Vitamin B-12: >1550 pg/mL — ABNORMAL HIGH (ref 211–911)

## 2020-08-23 LAB — FOLATE: Folate: >24.4 ng/mL (ref >5.9)

## 2020-08-24 LAB — URINALYSIS, ROUTINE W REFLEX MICROSCOPIC
Bilirubin Urine: NEGATIVE
Glucose, UA: NEGATIVE
Ketones, ur: NEGATIVE
Nitrite: POSITIVE — AB
Specific Gravity, Urine: 1.011 (ref 1.001–1.035)
Squamous Epithelial / HPF: NONE SEEN /HPF (ref <=5)
WBC, UA: >=60 /HPF — AB (ref 0–5)
pH: 5.5 (ref 5.0–8.0)

## 2020-08-24 LAB — URINE CULTURE
MICRO NUMBER:: 11961387
SPECIMEN QUALITY:: ADEQUATE

## 2020-08-24 LAB — MICROALBUMIN / CREATININE URINE RATIO
Creatinine, Urine: 48 mg/dL (ref 20–275)
Microalb Creat Ratio: 204 mcg/mg creat — ABNORMAL HIGH (ref <30)
Microalb, Ur: 9.8 mg/dL

## 2020-08-24 LAB — MICROSCOPIC MESSAGE

## 2020-08-26 ENCOUNTER — Other Ambulatory Visit: Payer: Self-pay

## 2020-08-26 ENCOUNTER — Other Ambulatory Visit: Payer: Self-pay | Admitting: Internal Medicine

## 2020-08-26 DIAGNOSIS — E039 Hypothyroidism, unspecified: Secondary | ICD-10-CM

## 2020-08-26 DIAGNOSIS — N390 Urinary tract infection, site not specified: Secondary | ICD-10-CM

## 2020-08-26 DIAGNOSIS — R944 Abnormal results of kidney function studies: Secondary | ICD-10-CM

## 2020-08-26 DIAGNOSIS — B9620 Unspecified Escherichia coli [E. coli] as the cause of diseases classified elsewhere: Secondary | ICD-10-CM

## 2020-08-26 LAB — COMPREHENSIVE METABOLIC PANEL
ALT: 10 U/L (ref 0–35)
AST: 15 U/L (ref 0–37)
Albumin: 3.9 g/dL (ref 3.5–5.2)
Alkaline Phosphatase: 100 U/L (ref 39–117)
BUN: 28 mg/dL — ABNORMAL HIGH (ref 6–23)
CO2: 25 mEq/L (ref 19–32)
Calcium: 9.2 mg/dL (ref 8.4–10.5)
Chloride: 102 mEq/L (ref 96–112)
Creatinine, Ser: 1.33 mg/dL — ABNORMAL HIGH (ref 0.40–1.20)
GFR: 40.41 mL/min — ABNORMAL LOW (ref >60.00)
Glucose, Bld: 118 mg/dL — ABNORMAL HIGH (ref 70–99)
Potassium: 5.3 mEq/L — ABNORMAL HIGH (ref 3.5–5.1)
Sodium: 139 mEq/L (ref 135–145)
Total Bilirubin: 0.4 mg/dL (ref 0.2–1.2)
Total Protein: 6.8 g/dL (ref 6.0–8.3)

## 2020-08-26 LAB — URINE CYTOLOGY ANCILLARY ONLY
Bacterial Vaginitis-Urine: NEGATIVE
Candida Urine: NEGATIVE

## 2020-08-26 MED ORDER — NITROFURANTOIN MONOHYD MACRO 100 MG PO CAPS: 100.0000 mg | ORAL_CAPSULE | Freq: Two times a day (BID) | ORAL | 0 refills | Status: DC

## 2020-08-26 MED ORDER — LEVOTHYROXINE SODIUM 175 MCG PO TABS: 175.0000 ug | ORAL_TABLET | Freq: Every day | ORAL | 3 refills | Status: DC

## 2020-08-26 NOTE — Telephone Encounter (Signed)
Patient seen in office and informed 08/22/20

## 2020-08-27 DIAGNOSIS — E1142 Type 2 diabetes mellitus with diabetic polyneuropathy: Secondary | ICD-10-CM | POA: Diagnosis not present

## 2020-08-27 DIAGNOSIS — I69319 Unspecified symptoms and signs involving cognitive functions following cerebral infarction: Secondary | ICD-10-CM | POA: Diagnosis not present

## 2020-08-27 DIAGNOSIS — E11621 Type 2 diabetes mellitus with foot ulcer: Secondary | ICD-10-CM | POA: Diagnosis not present

## 2020-08-27 DIAGNOSIS — E1151 Type 2 diabetes mellitus with diabetic peripheral angiopathy without gangrene: Secondary | ICD-10-CM | POA: Diagnosis not present

## 2020-08-27 DIAGNOSIS — I872 Venous insufficiency (chronic) (peripheral): Secondary | ICD-10-CM | POA: Diagnosis not present

## 2020-08-27 DIAGNOSIS — L97522 Non-pressure chronic ulcer of other part of left foot with fat layer exposed: Secondary | ICD-10-CM | POA: Diagnosis not present

## 2020-08-27 DIAGNOSIS — I4819 Other persistent atrial fibrillation: Secondary | ICD-10-CM | POA: Diagnosis not present

## 2020-08-27 DIAGNOSIS — E1159 Type 2 diabetes mellitus with other circulatory complications: Secondary | ICD-10-CM | POA: Diagnosis not present

## 2020-08-27 DIAGNOSIS — I152 Hypertension secondary to endocrine disorders: Secondary | ICD-10-CM | POA: Diagnosis not present

## 2020-08-27 DIAGNOSIS — L97422 Non-pressure chronic ulcer of left heel and midfoot with fat layer exposed: Secondary | ICD-10-CM | POA: Diagnosis not present

## 2020-08-28 DIAGNOSIS — E1159 Type 2 diabetes mellitus with other circulatory complications: Secondary | ICD-10-CM | POA: Diagnosis not present

## 2020-08-28 DIAGNOSIS — E1142 Type 2 diabetes mellitus with diabetic polyneuropathy: Secondary | ICD-10-CM | POA: Diagnosis not present

## 2020-08-28 DIAGNOSIS — L97522 Non-pressure chronic ulcer of other part of left foot with fat layer exposed: Secondary | ICD-10-CM | POA: Diagnosis not present

## 2020-08-28 DIAGNOSIS — L97422 Non-pressure chronic ulcer of left heel and midfoot with fat layer exposed: Secondary | ICD-10-CM | POA: Diagnosis not present

## 2020-08-28 DIAGNOSIS — I152 Hypertension secondary to endocrine disorders: Secondary | ICD-10-CM | POA: Diagnosis not present

## 2020-08-28 DIAGNOSIS — I872 Venous insufficiency (chronic) (peripheral): Secondary | ICD-10-CM | POA: Diagnosis not present

## 2020-08-28 DIAGNOSIS — E1151 Type 2 diabetes mellitus with diabetic peripheral angiopathy without gangrene: Secondary | ICD-10-CM | POA: Diagnosis not present

## 2020-08-28 DIAGNOSIS — I4819 Other persistent atrial fibrillation: Secondary | ICD-10-CM | POA: Diagnosis not present

## 2020-08-28 DIAGNOSIS — I69319 Unspecified symptoms and signs involving cognitive functions following cerebral infarction: Secondary | ICD-10-CM | POA: Diagnosis not present

## 2020-08-28 DIAGNOSIS — E11621 Type 2 diabetes mellitus with foot ulcer: Secondary | ICD-10-CM | POA: Diagnosis not present

## 2020-08-29 DIAGNOSIS — E11621 Type 2 diabetes mellitus with foot ulcer: Secondary | ICD-10-CM | POA: Diagnosis not present

## 2020-08-29 DIAGNOSIS — I69319 Unspecified symptoms and signs involving cognitive functions following cerebral infarction: Secondary | ICD-10-CM | POA: Diagnosis not present

## 2020-08-29 DIAGNOSIS — L97522 Non-pressure chronic ulcer of other part of left foot with fat layer exposed: Secondary | ICD-10-CM | POA: Diagnosis not present

## 2020-08-29 DIAGNOSIS — E1151 Type 2 diabetes mellitus with diabetic peripheral angiopathy without gangrene: Secondary | ICD-10-CM | POA: Diagnosis not present

## 2020-08-29 DIAGNOSIS — I152 Hypertension secondary to endocrine disorders: Secondary | ICD-10-CM | POA: Diagnosis not present

## 2020-08-29 DIAGNOSIS — I872 Venous insufficiency (chronic) (peripheral): Secondary | ICD-10-CM | POA: Diagnosis not present

## 2020-08-29 DIAGNOSIS — E1159 Type 2 diabetes mellitus with other circulatory complications: Secondary | ICD-10-CM | POA: Diagnosis not present

## 2020-08-29 DIAGNOSIS — I4819 Other persistent atrial fibrillation: Secondary | ICD-10-CM | POA: Diagnosis not present

## 2020-08-29 DIAGNOSIS — E1142 Type 2 diabetes mellitus with diabetic polyneuropathy: Secondary | ICD-10-CM | POA: Diagnosis not present

## 2020-08-29 DIAGNOSIS — L97422 Non-pressure chronic ulcer of left heel and midfoot with fat layer exposed: Secondary | ICD-10-CM | POA: Diagnosis not present

## 2020-08-30 ENCOUNTER — Ambulatory Visit: Payer: Medicare HMO | Admitting: Physician Assistant

## 2020-08-30 ENCOUNTER — Other Ambulatory Visit: Payer: Self-pay | Admitting: Internal Medicine

## 2020-08-30 ENCOUNTER — Telehealth: Payer: Self-pay | Admitting: Internal Medicine

## 2020-08-30 DIAGNOSIS — N3000 Acute cystitis without hematuria: Secondary | ICD-10-CM

## 2020-08-30 MED ORDER — SULFAMETHOXAZOLE-TRIMETHOPRIM 800-160 MG PO TABS: 1.0000 | ORAL_TABLET | Freq: Two times a day (BID) | ORAL | 0 refills | Status: DC

## 2020-08-30 NOTE — Telephone Encounter (Signed)
Providing Access Note Documentation.

## 2020-08-30 NOTE — Telephone Encounter (Signed)
Sent bactrim ds bid x 5 days with food after completed macrobid bid x 5 days and still feels sx's   If uti sx's continue until she establishes with urogyn Dr. Wannetta Sender in [place] she needs to f/u with urology here until she transitions her care for recurrent UTI

## 2020-08-30 NOTE — Telephone Encounter (Signed)
Transfer to [facility] at Access Nurse   PT called into the office to advise that she has a lot of blood in her urine and has pain/discomfort.

## 2020-08-30 NOTE — Telephone Encounter (Signed)
Pt was seen on 6/2 and given macrobid for uti. Pt states she takes her last pill today and is requesting to come leave another specimen or have her husband pick up a cup if another specimen is needed.. She is having painful urination, blood in her urine, chills. No fever. She says she doesn't feel like going anywhere. Would you be agreeable to extend her antibiotics?

## 2020-08-30 NOTE — Telephone Encounter (Signed)
Access nurse transferred call back sent to [facility]

## 2020-08-30 NOTE — Telephone Encounter (Signed)
Noted. Will wait for triage note

## 2020-09-02 DIAGNOSIS — E1159 Type 2 diabetes mellitus with other circulatory complications: Secondary | ICD-10-CM | POA: Diagnosis not present

## 2020-09-02 DIAGNOSIS — E1142 Type 2 diabetes mellitus with diabetic polyneuropathy: Secondary | ICD-10-CM | POA: Diagnosis not present

## 2020-09-02 DIAGNOSIS — L97422 Non-pressure chronic ulcer of left heel and midfoot with fat layer exposed: Secondary | ICD-10-CM | POA: Diagnosis not present

## 2020-09-02 DIAGNOSIS — I4819 Other persistent atrial fibrillation: Secondary | ICD-10-CM | POA: Diagnosis not present

## 2020-09-02 DIAGNOSIS — L97522 Non-pressure chronic ulcer of other part of left foot with fat layer exposed: Secondary | ICD-10-CM | POA: Diagnosis not present

## 2020-09-02 DIAGNOSIS — I872 Venous insufficiency (chronic) (peripheral): Secondary | ICD-10-CM | POA: Diagnosis not present

## 2020-09-02 DIAGNOSIS — E1151 Type 2 diabetes mellitus with diabetic peripheral angiopathy without gangrene: Secondary | ICD-10-CM | POA: Diagnosis not present

## 2020-09-02 DIAGNOSIS — I152 Hypertension secondary to endocrine disorders: Secondary | ICD-10-CM | POA: Diagnosis not present

## 2020-09-02 DIAGNOSIS — I69319 Unspecified symptoms and signs involving cognitive functions following cerebral infarction: Secondary | ICD-10-CM | POA: Diagnosis not present

## 2020-09-02 DIAGNOSIS — E11621 Type 2 diabetes mellitus with foot ulcer: Secondary | ICD-10-CM | POA: Diagnosis not present

## 2020-09-02 NOTE — Telephone Encounter (Signed)
Spoke with Patient and husband. They were able to pick up the antibiotic on Friday 08/30/20. Patient was having odor, dark urine, and blood in urine. They state with current antibiotic her urine has lightened up, no blood, but so far still an order.   Verbalized understanding and will see [facility] if symptoms persist. Patient states she does not want to see them again but will if she absolutely has to. Patient states she has called Uro-gynecology twice and will call again today.

## 2020-09-02 NOTE — Telephone Encounter (Signed)
PT called to advise that she has reached out 3 times to the Uro-gynecology and has not heard anything back from them. She would like to find out what can be done as she would like to be seen asap by one. PT would like a callback.

## 2020-09-03 ENCOUNTER — Other Ambulatory Visit: Payer: Self-pay | Admitting: Internal Medicine

## 2020-09-03 DIAGNOSIS — R300 Dysuria: Secondary | ICD-10-CM | POA: Diagnosis not present

## 2020-09-03 DIAGNOSIS — B373 Candidiasis of vulva and vagina: Secondary | ICD-10-CM

## 2020-09-03 DIAGNOSIS — N390 Urinary tract infection, site not specified: Secondary | ICD-10-CM | POA: Diagnosis not present

## 2020-09-03 DIAGNOSIS — B3731 Acute candidiasis of vulva and vagina: Secondary | ICD-10-CM

## 2020-09-03 MED ORDER — FLUCONAZOLE 150 MG PO TABS: 150.0000 mg | ORAL_TABLET | Freq: Once | ORAL | 0 refills | Status: AC

## 2020-09-03 NOTE — Telephone Encounter (Signed)
She has to f/u with current urology for appt asap  Urogynecology is out of the office until 10/2020 maternity leave then can establish

## 2020-09-03 NOTE — Telephone Encounter (Signed)
Spoke with Patient and informed her of the below. Patient states that for the past 4-5 days she has had a lot of blood in her urine. Informed the Patient that our office does not have anything open today and she needs to be seen at the ED or urgent care. Patient has history of reoccurring UTIs and kidney stones. Patient verbalized understanding and will go to urgent care while also calling for a urgent follow up with [facility].   Patient states she also has a yeast infection due to UTI and would like medication sent in

## 2020-09-04 ENCOUNTER — Telehealth: Payer: Self-pay | Admitting: Internal Medicine

## 2020-09-04 ENCOUNTER — Other Ambulatory Visit: Payer: Self-pay

## 2020-09-04 ENCOUNTER — Encounter: Payer: Medicare HMO | Attending: Internal Medicine | Admitting: Internal Medicine

## 2020-09-04 DIAGNOSIS — X58XXXA Exposure to other specified factors, initial encounter: Secondary | ICD-10-CM | POA: Diagnosis not present

## 2020-09-04 DIAGNOSIS — I10 Essential (primary) hypertension: Secondary | ICD-10-CM | POA: Diagnosis not present

## 2020-09-04 DIAGNOSIS — L97322 Non-pressure chronic ulcer of left ankle with fat layer exposed: Secondary | ICD-10-CM | POA: Insufficient documentation

## 2020-09-04 DIAGNOSIS — M19012 Primary osteoarthritis, left shoulder: Secondary | ICD-10-CM | POA: Diagnosis not present

## 2020-09-04 DIAGNOSIS — W010XXD Fall on same level from slipping, tripping and stumbling without subsequent striking against object, subsequent encounter: Secondary | ICD-10-CM | POA: Diagnosis not present

## 2020-09-04 DIAGNOSIS — Z86718 Personal history of other venous thrombosis and embolism: Secondary | ICD-10-CM | POA: Diagnosis not present

## 2020-09-04 DIAGNOSIS — G8929 Other chronic pain: Secondary | ICD-10-CM | POA: Diagnosis not present

## 2020-09-04 DIAGNOSIS — L97812 Non-pressure chronic ulcer of other part of right lower leg with fat layer exposed: Secondary | ICD-10-CM | POA: Insufficient documentation

## 2020-09-04 DIAGNOSIS — Z7901 Long term (current) use of anticoagulants: Secondary | ICD-10-CM | POA: Diagnosis not present

## 2020-09-04 DIAGNOSIS — W228XXA Striking against or struck by other objects, initial encounter: Secondary | ICD-10-CM | POA: Insufficient documentation

## 2020-09-04 DIAGNOSIS — L98492 Non-pressure chronic ulcer of skin of other sites with fat layer exposed: Secondary | ICD-10-CM | POA: Diagnosis not present

## 2020-09-04 DIAGNOSIS — S51811A Laceration without foreign body of right forearm, initial encounter: Secondary | ICD-10-CM | POA: Diagnosis not present

## 2020-09-04 DIAGNOSIS — E11621 Type 2 diabetes mellitus with foot ulcer: Secondary | ICD-10-CM | POA: Insufficient documentation

## 2020-09-04 DIAGNOSIS — L97522 Non-pressure chronic ulcer of other part of left foot with fat layer exposed: Secondary | ICD-10-CM | POA: Insufficient documentation

## 2020-09-04 DIAGNOSIS — I872 Venous insufficiency (chronic) (peripheral): Secondary | ICD-10-CM | POA: Diagnosis not present

## 2020-09-04 DIAGNOSIS — S50311A Abrasion of right elbow, initial encounter: Secondary | ICD-10-CM | POA: Diagnosis not present

## 2020-09-04 DIAGNOSIS — M62838 Other muscle spasm: Secondary | ICD-10-CM | POA: Diagnosis not present

## 2020-09-04 DIAGNOSIS — M25512 Pain in left shoulder: Secondary | ICD-10-CM | POA: Diagnosis not present

## 2020-09-04 NOTE — Telephone Encounter (Signed)
-  Laelle Bridgett fax order to [facility] to repeat Potassium BMET with GFR in 1 week   Faxed and received confirmation

## 2020-09-05 NOTE — Progress Notes (Signed)
[name], [name] ([id number]) Visit Report for 09/04/2020 Arrival Information Details Patient Name: [name] Date of Service: 09/04/2020 10:15 AM Medical Record Number: [id number] Patient Account Number: [id number] Date of Birth/Sex: 01/17/1951 (70 y.o. F) Treating RN: Cornell Barman Primary Care Omesha Bowerman: [name], Olivia Mackie Other Clinician: Referring Eliette Drumwright: [name], Olivia Mackie Treating Shivam Mestas/Extender: Tito Dine in Treatment: 9 Visit Information History Since Last Visit Added or deleted any medications: Yes Patient Arrived: Gilford Rile Had a fall or experienced change in No Arrival Time: 10:23 activities of daily living that may affect Accompanied By: husband risk of falls: Transfer Assistance: EasyPivot Patient Lift Hospitalized since last visit: Yes Patient Identification Verified: Yes Pain Present Now: Yes Secondary Verification Process Completed: Yes Patient Has Alerts: Yes Patient Alerts: Patient on Blood Thinner Eliquis Diabetic II Electronic Signature(s) Signed: 09/05/2020 3:50:18 PM By: Jeanine Luz Entered By: Jeanine Luz on 09/04/2020 10:24:04 [name] ([id number]) -------------------------------------------------------------------------------- Clinic Level of Care Assessment Details Patient Name: [name] Date of Service: 09/04/2020 10:15 AM Medical Record Number: [id number] Patient Account Number: [id number] Date of Birth/Sex: 1950/04/26 (69 y.o. F) Treating RN: Cornell Barman Primary Care Clement Deneault: [name], Olivia Mackie Other Clinician: Referring Dontea Corlew: [name], Olivia Mackie Treating Swade Shonka/Extender: Tito Dine in Treatment: 9 Clinic Level of Care Assessment Items TOOL 4 Quantity Score [] - Use when only an EandM is performed on FOLLOW-UP visit 0 ASSESSMENTS - Nursing Assessment / Reassessment [] - Reassessment of Co-morbidities (includes updates in patient status) 0 [] - 0 Reassessment of  Adherence to Treatment Plan ASSESSMENTS - Wound and Skin Assessment / Reassessment X - Simple Wound Assessment / Reassessment - one wound 1 5 [] - 0 Complex Wound Assessment / Reassessment - multiple wounds [] - 0 Dermatologic / Skin Assessment (not related to wound area) ASSESSMENTS - Focused Assessment [] - Circumferential Edema Measurements - multi extremities 0 [] - 0 Nutritional Assessment / Counseling / Intervention [] - 0 Lower Extremity Assessment (monofilament, tuning fork, pulses) [] - 0 Peripheral Arterial Disease Assessment (using hand held doppler) ASSESSMENTS - Ostomy and/or Continence Assessment and Care [] - Incontinence Assessment and Management 0 [] - 0 Ostomy Care Assessment and Management (repouching, etc.) PROCESS - Coordination of Care X - Simple Patient / Family Education for ongoing care 1 15 [] - 0 Complex (extensive) Patient / Family Education for ongoing care X- 1 10 Staff obtains Programmer, systems, Records, Test Results / Process Orders [] - 0 Staff telephones HHA, Nursing Homes / Clarify orders / etc [] - 0 Routine Transfer to another Facility (non-emergent condition) [] - 0 Routine Hospital Admission (non-emergent condition) [] - 0 New Admissions / Biomedical engineer / Ordering NPWT, Apligraf, etc. [] - 0 Emergency Hospital Admission (emergent condition) X- 1 10 Simple Discharge Coordination [] - 0 Complex (extensive) Discharge Coordination PROCESS - Special Needs [] - Pediatric / Minor Patient Management 0 [] - 0 Isolation Patient Management [] - 0 Hearing / Language / Visual special needs [] - 0 Assessment of Community assistance (transportation, D/C planning, etc.) [] - 0 Additional assistance / Altered mentation [] - 0 Support Surface(s) Assessment (bed, cushion, seat, etc.) INTERVENTIONS - Wound Cleansing / Measurement Gropp, Caterine B. ([id number]) X- 1 5 Simple Wound Cleansing - one wound [] - 0 Complex Wound Cleansing -  multiple wounds X- 1 5 Wound Imaging (photographs - any number of wounds) [] - 0 Wound Tracing (instead of photographs) X- 1 5 Simple Wound Measurement - one wound [] - 0 Complex Wound Measurement -  multiple wounds INTERVENTIONS - Wound Dressings X - Small Wound Dressing one or multiple wounds 1 10 [] - 0 Medium Wound Dressing one or multiple wounds [] - 0 Large Wound Dressing one or multiple wounds [] - 0 Application of Medications - topical [] - 0 Application of Medications - injection INTERVENTIONS - Miscellaneous [] - External ear exam 0 [] - 0 Specimen Collection (cultures, biopsies, blood, body fluids, etc.) [] - 0 Specimen(s) / Culture(s) sent or taken to Lab for analysis [] - 0 Patient Transfer (multiple staff / Civil Service fast streamer / Similar devices) [] - 0 Simple Staple / Suture removal (25 or less) [] - 0 Complex Staple / Suture removal (26 or more) [] - 0 Hypo / Hyperglycemic Management (close monitor of Blood Glucose) [] - 0 Ankle / Brachial Index (ABI) - do not check if billed separately X- 1 5 Vital Signs Has the patient been seen at the hospital within the last three years: Yes Total Score: 70 Level Of Care: New/Established - Level 2 Electronic Signature(s) Signed: 09/04/2020 5:10:04 PM By: Gretta Cool, BSN, RN, CWS, Kim RN, BSN Entered By: Gretta Cool, BSN, RN, CWS, Kim on 09/04/2020 11:05:17 [name], [name] ([id number]) -------------------------------------------------------------------------------- Encounter Discharge Information Details Patient Name: [name] Date of Service: 09/04/2020 10:15 AM Medical Record Number: [id number] Patient Account Number: [id number] Date of Birth/Sex: Aug 10, 1950 (69 y.o. F) Treating RN: Cornell Barman Primary Care : [name], Olivia Mackie Other Clinician: Referring : [name], Olivia Mackie Treating /Extender: Tito Dine in Treatment: 9 Encounter Discharge Information Items Discharge Condition:  Stable Ambulatory Status: Walker Discharge Destination: Home Transportation: Private Auto Accompanied By: husband Schedule Follow-up Appointment: Yes Clinical Summary of Care: Electronic Signature(s) Signed: 09/05/2020 3:50:18 PM By: Jeanine Luz Entered By: Jeanine Luz on 09/04/2020 11:23:08 [name] ([id number]) -------------------------------------------------------------------------------- Lower Extremity Assessment Details Patient Name: [name] Date of Service: 09/04/2020 10:15 AM Medical Record Number: [id number] Patient Account Number: [id number] Date of Birth/Sex: 06/20/1950 (69 y.o. F) Treating RN: Cornell Barman Primary Care : [name], Olivia Mackie Other Clinician: Referring : [name], Olivia Mackie Treating /Extender: Tito Dine in Treatment: 9 Electronic Signature(s) Signed: 09/04/2020 5:10:04 PM By: Gretta Cool BSN, RN, CWS, Kim RN, BSN Signed: 09/05/2020 3:50:18 PM By: Jeanine Luz Entered By: Jeanine Luz on 09/04/2020 10:36:51 [name], [name] ([id number]) -------------------------------------------------------------------------------- Multi Wound Chart Details Patient Name: [name] Date of Service: 09/04/2020 10:15 AM Medical Record Number: [id number] Patient Account Number: [id number] Date of Birth/Sex: [birth year] (70 y.o. F) Treating RN: Cornell Barman Primary Care : [name], Olivia Mackie Other Clinician: Referring : [name], Olivia Mackie Treating /Extender: Tito Dine in Treatment: 9 Vital Signs Height(in): 65.5 Pulse(bpm): 69 Weight(lbs): 240 Blood Pressure(mmHg): 139/56 Body Mass Index(BMI): 39 Temperature(F): 97.9 Respiratory Rate(breaths/min): 20 Photos: [N/A:N/A] Wound Location: Right Elbow N/A N/A Wounding Event: Trauma N/A N/A Primary Etiology: Trauma, Other N/A N/A Comorbid History: Asthma, Pneumothorax, N/A N/A Arrhythmia, Hypertension, Type  II Diabetes, Gout, Osteoarthritis, Neuropathy Date Acquired: 08/16/2020 N/A N/A Weeks of Treatment: 2 N/A N/A Wound Status: Open N/A N/A Measurements L x W x D (cm) 0.2x0.8x0.1 N/A N/A Area (cm) : 0.126 N/A N/A Volume (cm) : 0.013 N/A N/A % Reduction in Area: 19.70% N/A N/A % Reduction in Volume: 18.80% N/A N/A Classification: Full Thickness Without Exposed N/A N/A Support Structures Exudate Amount: Medium N/A N/A Exudate Type: Sanguinous N/A N/A Exudate Color: red N/A N/A Granulation Amount: Small (1-33%) N/A N/A Granulation Quality: Pink N/A N/A Necrotic Amount: Small (1-33%) N/A N/A Necrotic Tissue: Eschar  N/A N/A Exposed Structures: Fat Layer (Subcutaneous Tissue): N/A N/A Yes Fascia: No Tendon: No Muscle: No Joint: No Bone: No Epithelialization: Small (1-33%) N/A N/A Treatment Notes Electronic Signature(s) Signed: 09/04/2020 4:10:05 PM By: Linton Ham MD Entered By: Linton Ham on 09/04/2020 11:17:51 Clerk, [name] ([id number]) -------------------------------------------------------------------------------- Multi-Disciplinary Care Plan Details Patient Name: [name] Date of Service: 09/04/2020 10:15 AM Medical Record Number: [id number] Patient Account Number: [id number] Date of Birth/Sex: 11-Apr-1950 (70 y.o. F) Treating RN: Cornell Barman Primary Care : [name], Olivia Mackie Other Clinician: Referring : [name], Olivia Mackie Treating /Extender: Tito Dine in Treatment: 9 Active Inactive Wound/Skin Impairment Nursing Diagnoses: Knowledge deficit related to ulceration/compromised skin integrity Goals: Patient/caregiver will verbalize understanding of skin care regimen Date Initiated: 06/28/2020 Date Inactivated: 08/16/2020 Target Resolution Date: 07/28/2020 Goal Status: Met Ulcer/skin breakdown will have a volume reduction of 30% by week 4 Date Initiated: 06/28/2020 Date Inactivated: 08/16/2020 Target Resolution  Date: 07/28/2020 Goal Status: Met Ulcer/skin breakdown will have a volume reduction of 50% by week 8 Date Initiated: 06/28/2020 Target Resolution Date: 08/28/2020 Goal Status: Active Ulcer/skin breakdown will have a volume reduction of 80% by week 12 Date Initiated: 06/28/2020 Target Resolution Date: 09/27/2020 Goal Status: Active Ulcer/skin breakdown will heal within 14 weeks Date Initiated: 06/28/2020 Target Resolution Date: 10/28/2020 Goal Status: Active Interventions: Assess patient/caregiver ability to obtain necessary supplies Assess patient/caregiver ability to perform ulcer/skin care regimen upon admission and as needed Assess ulceration(s) every visit Notes: Electronic Signature(s) Signed: 09/04/2020 5:10:04 PM By: Gretta Cool, BSN, RN, CWS, Kim RN, BSN Entered By: Gretta Cool, BSN, RN, CWS, Kim on 09/04/2020 10:59:35 [name], [name] ([id number]) -------------------------------------------------------------------------------- Pain Assessment Details Patient Name: [name] Date of Service: 09/04/2020 10:15 AM Medical Record Number: [id number] Patient Account Number: [id number] Date of Birth/Sex: 08/31/1950 (69 y.o. F) Treating RN: Cornell Barman Primary Care : [name], Olivia Mackie Other Clinician: Referring : [name], Olivia Mackie Treating /Extender: Tito Dine in Treatment: 9 Active Problems Location of Pain Severity and Description of Pain Patient Has Paino Yes Site Locations Pain Location: Generalized Pain Rate the pain. Current Pain Level: 5 Pain Management and Medication Current Pain Management: Electronic Signature(s) Signed: 09/04/2020 5:10:04 PM By: Gretta Cool, BSN, RN, CWS, Kim RN, BSN Signed: 09/05/2020 3:50:18 PM By: Jeanine Luz Entered By: Jeanine Luz on 09/04/2020 10:30:51 [name], [name] ([id number]) -------------------------------------------------------------------------------- Patient/Caregiver Education Details Patient  Name: [name] Date of Service: 09/04/2020 10:15 AM Medical Record Number: [id number] Patient Account Number: [id number] Date of Birth/Gender: 1950-10-31 (70 y.o. F) Treating RN: Cornell Barman Primary Care Physician: [name], Olivia Mackie Other Clinician: Referring Physician: [name], Olivia Mackie Treating Physician/Extender: Tito Dine in Treatment: 9 Education Assessment Education Provided To: Patient Education Topics Provided Wound/Skin Impairment: Handouts: Caring for Your Ulcer Methods: Demonstration, Explain/Verbal Responses: State content correctly Electronic Signature(s) Signed: 09/04/2020 5:10:04 PM By: Gretta Cool, BSN, RN, CWS, Kim RN, BSN Entered By: Gretta Cool, BSN, RN, CWS, Kim on 09/04/2020 11:05:50 [name], [name] ([id number]) -------------------------------------------------------------------------------- Wound Assessment Details Patient Name: [name] Date of Service: 09/04/2020 10:15 AM Medical Record Number: [id number] Patient Account Number: [id number] Date of Birth/Sex: April 30, 1950 (69 y.o. F) Treating RN: Cornell Barman Primary Care : [name], Olivia Mackie Other Clinician: Referring : [name], Olivia Mackie Treating /Extender: Tito Dine in Treatment: 9 Wound Status Wound Number: 11 Primary Trauma, Other Etiology: Wound Location: Right Elbow Wound Open Wounding Event: Trauma Status: Date Acquired: 08/16/2020 Comorbid Asthma, Pneumothorax, Arrhythmia, Hypertension, Type Weeks Of Treatment: 2 History: II Diabetes, Gout, Osteoarthritis, Neuropathy Clustered Wound:  No Photos Wound Measurements Length: (cm) 0.2 Width: (cm) 0.8 Depth: (cm) 0.1 Area: (cm) 0.126 Volume: (cm) 0.013 % Reduction in Area: 19.7% % Reduction in Volume: 18.8% Epithelialization: Small (1-33%) Tunneling: No Undermining: No Wound Description Classification: Full Thickness Without Exposed Support Structures Exudate Amount:  Medium Exudate Type: Sanguinous Exudate Color: red Foul Odor After Cleansing: No Slough/Fibrino No Wound Bed Granulation Amount: Small (1-33%) Exposed Structure Granulation Quality: Pink Fascia Exposed: No Necrotic Amount: Small (1-33%) Fat Layer (Subcutaneous Tissue) Exposed: Yes Necrotic Quality: Eschar Tendon Exposed: No Muscle Exposed: No Joint Exposed: No Bone Exposed: No Treatment Notes Wound #11 (Elbow) Wound Laterality: Right Cleanser Normal Saline Discharge Instruction: Wash your hands with soap and water. Remove old dressing, discard into plastic bag and place into trash. Cleanse the wound with Normal Saline prior to applying a clean dressing using gauze sponges, not tissues or cotton balls. Do not scrub or use excessive force. Pat dry using gauze sponges, not tissue or cotton balls. Soap and [address] BEREA, MAJKOWSKI ([id number]) Discharge Instruction: Gently cleanse wound with antibacterial soap, rinse and pat dry prior to dressing wounds Peri-Wound Care Topical Primary Dressing Xeroform-HBD 2x2 (in/in) Discharge Instruction: Apply Xeroform-HBD 2x2 (in/in) as directed Secondary Dressing ABD Pad 5x9 (in/in) Discharge Instruction: Cover with ABD pad Kerlix 4.5 x 4.1 (in/yd) Discharge Instruction: Apply Kerlix 4.5 x 4.1 (in/yd) as instructed Secured With 7M Medipore H Soft Cloth Surgical Tape, 2x2 (in/yd) Compression Wrap Compression Stockings Environmental education officer) Signed: 09/04/2020 5:10:04 PM By: Gretta Cool, BSN, RN, CWS, Kim RN, BSN Entered By: Gretta Cool, BSN, RN, CWS, Kim on 09/04/2020 11:03:03 [name], [name] ([id number]) -------------------------------------------------------------------------------- Vitals Details Patient Name: [name] Date of Service: 09/04/2020 10:15 AM Medical Record Number: [id number] Patient Account Number: [id number] Date of Birth/Sex: 23-Nov-1950 (69 y.o. F) Treating RN: Cornell Barman Primary Care Milos Milligan: [name],  Olivia Mackie Other Clinician: Referring Lizzette Carbonell: [name], Olivia Mackie Treating Mcdonald Reiling/Extender: Tito Dine in Treatment: 9 Vital Signs Time Taken: 10:25 Temperature (F): 97.9 Height (in): 65.5 Pulse (bpm): 77 Weight (lbs): 240 Respiratory Rate (breaths/min): 20 Body Mass Index (BMI): 39.3 Blood Pressure (mmHg): 139/56 Reference Range: 80 - 120 mg / dl Electronic Signature(s) Signed: 09/05/2020 3:50:18 PM By: Jeanine Luz Entered By: Jeanine Luz on 09/04/2020 10:28:58

## 2020-09-05 NOTE — Progress Notes (Signed)
LENNIS, RADER ([id number]) Visit Report for 09/04/2020 HPI Details Patient Name: [name] Date of Service: 09/04/2020 10:15 AM Medical Record Number: [id number] Patient Account Number: [id number] Date of Birth/Sex: 1950/08/30 (70 y.o. F) Treating RN: Cornell Barman Primary Care Provider: [name], Olivia Mackie Other Clinician: Referring Provider: [name], Olivia Mackie Treating Provider/Extender: Tito Dine in Treatment: 9 History of Present Illness HPI Description: 06/28/20 upon evaluation today patient appears to be doing unfortunately somewhat poorly in regard to wounds that she has over her lower extremities. She has a wound on the left distal second toe, left posterior heel, and right anterior lower leg. With that being said all in all none of the wounds appear to be too bad the one that hurts her the most is actually the wound on the posterior heel which actually is also one of the smallest. Nonetheless I think there is some callus hiding what is really going on in this area. Fortunately there does not appear to be any obvious evidence of infection which is great news. Patient does have a history of diabetes mellitus type 2, chronic venous insufficiency, hypertension, history of DVTs, and is on long-term anticoagulant therapy, Eliquis. 07/05/20 upon evaluation today patient appears to be doing well currently with regard to her wounds. I feel like she is making progress which is great news and overall there is no signs of active infection at this time. No fevers, chills, nausea, vomiting, or diarrhea. 4/29; patient has a only a small area remaining on the tip of her left second toe and a small area remaining on the left heel. The area on the right anterior lower leg is healed. They tell me that she had remote podiatric surgery on the toes of the left foot however they are very fixed in position and I wonder whether they are making it difficult to relieve pressure in her foot  wear. She is a type II diabetic with neuropathy as well. 08/01/2020 upon evaluation today patient appears to be doing well currently in regard to her wounds. In fact everything appears to be doing much better. The posterior aspect of her heel is actually giving her some trouble not the Achilles area but a small wound that we did not even have marked. In fact I had noted this before. Nonetheless we are going to addressed that today. 5/27; most of the patient's wounds are healed including the left Achilles heel left toe. Right forearm is also healed. She has a new abrasion posteriorly in the right elbow area just above the olecranon this happened today with an abrasion from her car door 6/15; the patient's wounds on the left Achilles and left second toe remain healed. The area on the right forearm is also just about healed in fact the skin I replaced last time is looking viable which is good. She had me look at the left heel again which is not in the area of the wound more towards the tip of the heel at the Achilles insertion site as well as the tip of the left foot second toe. In the Achilles area that is clearly is friction probably in bed at night. Not really near where the wound was. The left second toe remains healed although this is hammered and overhanging first toe also puts pressure in this area Electronic Signature(s) Signed: 09/04/2020 4:10:05 PM By: Linton Ham MD Entered By: Linton Ham on 09/04/2020 [place], [name] ([id number]) -------------------------------------------------------------------------------- Physical Exam Details Patient Name: [name] Date of Service:  09/04/2020 10:15 AM Medical Record Number: [id number] Patient Account Number: [id number] Date of Birth/Sex: [birth year] (70 y.o. F) Treating RN: Cornell Barman Primary Care Provider: [name], Olivia Mackie Other Clinician: Referring Provider: [name], Olivia Mackie Treating Provider/Extender: Tito Dine in Treatment: 9 Constitutional Sitting or standing Blood Pressure is within target range for patient.. Pulse regular and within target range for patient.Marland Kitchen Respirations regular, non- labored and within target range.. Temperature is normal and within the target range for the patient.Marland Kitchen appears in no distress. Notes .Wound exam; left Achilles heel remains healed second toe tip is also healed. Again she had some callus in this area which I removed there is no open wound. o The area of skin tear on the right posterior arm is just about adherent the flap of skin I replaced last time seems to be viable. Just at the insertion site the area is still little Electronic Signature(s) Signed: 09/04/2020 4:10:05 PM By: Linton Ham MD Entered By: Linton Ham on 09/04/2020 11:20:57 YAZLEEMAR, STRASSNER ([id number]) -------------------------------------------------------------------------------- Physician Orders Details Patient Name: [name] Date of Service: 09/04/2020 10:15 AM Medical Record Number: [id number] Patient Account Number: [id number] Date of Birth/Sex: [birth year] (69 y.o. F) Treating RN: Cornell Barman Primary Care Provider: [name], Olivia Mackie Other Clinician: Referring Provider: [name], Olivia Mackie Treating Provider/Extender: Tito Dine in Treatment: 9 Verbal / Phone Orders: No Diagnosis Coding Follow-up Appointments o Return Appointment in 1 week. [place] for wound care. May utilize formulary equivalent dressing for wound treatment orders unless otherwise specified. Home Health Nurse may visit PRN to address patientos wound care needs. [facility] Bathing/ Shower/ Hygiene o May shower; gently cleanse wound with antibacterial soap, rinse and pat dry prior to dressing wounds Edema Control - Lymphedema / Segmental Compressive Device / Other o Elevate, Exercise Daily and Avoid Standing for Long Periods of Time. o  Elevate legs to the level of the heart and pump ankles as often as possible o Elevate leg(s) parallel to the floor when sitting. Off-Loading o Other: - Keep pressure off of toes and heels! Wound Treatment Wound #11 - Elbow Wound Laterality: Right Cleanser: Normal Saline 1 x Per Day/30 Days Discharge Instructions: Wash your hands with soap and water. Remove old dressing, discard into plastic bag and place into trash. Cleanse the wound with Normal Saline prior to applying a clean dressing using gauze sponges, not tissues or cotton balls. Do not scrub or use excessive force. Pat dry using gauze sponges, not tissue or cotton balls. Cleanser: Soap and Water 1 x Per Day/30 Days Discharge Instructions: Gently cleanse wound with antibacterial soap, rinse and pat dry prior to dressing wounds Primary Dressing: Xeroform-HBD 2x2 (in/in) (Generic) 1 x Per Day/30 Days Discharge Instructions: Apply Xeroform-HBD 2x2 (in/in) as directed Secondary Dressing: ABD Pad 5x9 (in/in) (Generic) 1 x Per Day/30 Days Discharge Instructions: Cover with ABD pad Secondary Dressing: Kerlix 4.5 x 4.1 (in/yd) (Generic) 1 x Per Day/30 Days Discharge Instructions: Apply Kerlix 4.5 x 4.1 (in/yd) as instructed Secured With: 75M Medipore H Soft Cloth Surgical Tape, 2x2 (in/yd) (Generic) 1 x Per Day/30 Days Electronic Signature(s) Signed: 09/04/2020 4:10:05 PM By: Linton Ham MD Signed: 09/04/2020 5:10:04 PM By: Gretta Cool, BSN, RN, CWS, Kim RN, BSN Entered By: Gretta Cool, BSN, RN, CWS, Kim on 09/04/2020 11:04:38 IXCHEL, DUCK ([id number]) -------------------------------------------------------------------------------- Problem List Details Patient Name: [name] Date of Service: 09/04/2020 10:15 AM Medical Record Number: [id number] Patient Account Number: [id number] Date of Birth/Sex: [birth year] (70 y.o. F) Treating  RN: Cornell Barman Primary Care Provider: [name], Olivia Mackie Other Clinician: Referring Provider:  [name], Olivia Mackie Treating Provider/Extender: Tito Dine in Treatment: 9 Active Problems ICD-10 Encounter Code Description Active Date MDM Diagnosis E11.621 Type 2 diabetes mellitus with foot ulcer 06/28/2020 No Yes I87.2 Venous insufficiency (chronic) (peripheral) 06/28/2020 No Yes L97.522 Non-pressure chronic ulcer of other part of left foot with fat layer 06/28/2020 No Yes exposed L97.812 Non-pressure chronic ulcer of other part of right lower leg with fat layer 06/28/2020 No Yes exposed L97.322 Non-pressure chronic ulcer of left ankle with fat layer exposed 06/28/2020 No Yes Z79.01 Long term (current) use of anticoagulants 06/28/2020 No Yes S50.311D Abrasion of right elbow, subsequent encounter 08/16/2020 No Yes Inactive Problems ICD-10 Code Description Active Date Inactive Date I10 Essential (primary) hypertension 06/28/2020 06/28/2020 Z86.718 Personal history of other venous thrombosis and embolism 06/28/2020 06/28/2020 Resolved Problems Electronic Signature(s) Signed: 09/04/2020 4:10:05 PM By: Linton Ham MD Entered By: Linton Ham on 09/04/2020 11:17:30 [place], [name] ([id number]) -------------------------------------------------------------------------------- Progress Note Details Patient Name: [name] Date of Service: 09/04/2020 10:15 AM Medical Record Number: [id number] Patient Account Number: [id number] Date of Birth/Sex: 1951/02/12 (69 y.o. F) Treating RN: Cornell Barman Primary Care Provider: [name], Olivia Mackie Other Clinician: Referring Provider: [name], Olivia Mackie Treating Provider/Extender: Tito Dine in Treatment: 9 Subjective History of Present Illness (HPI) 06/28/20 upon evaluation today patient appears to be doing unfortunately somewhat poorly in regard to wounds that she has over her lower extremities. She has a wound on the left distal second toe, left posterior heel, and right anterior lower leg. With that being said all in  all none of the wounds appear to be too bad the one that hurts her the most is actually the wound on the posterior heel which actually is also one of the smallest. Nonetheless I think there is some callus hiding what is really going on in this area. Fortunately there does not appear to be any obvious evidence of infection which is great news. Patient does have a history of diabetes mellitus type 2, chronic venous insufficiency, hypertension, history of DVTs, and is on long-term anticoagulant therapy, Eliquis. 07/05/20 upon evaluation today patient appears to be doing well currently with regard to her wounds. I feel like she is making progress which is great news and overall there is no signs of active infection at this time. No fevers, chills, nausea, vomiting, or diarrhea. 4/29; patient has a only a small area remaining on the tip of her left second toe and a small area remaining on the left heel. The area on the right anterior lower leg is healed. They tell me that she had remote podiatric surgery on the toes of the left foot however they are very fixed in position and I wonder whether they are making it difficult to relieve pressure in her foot wear. She is a type II diabetic with neuropathy as well. 08/01/2020 upon evaluation today patient appears to be doing well currently in regard to her wounds. In fact everything appears to be doing much better. The posterior aspect of her heel is actually giving her some trouble not the Achilles area but a small wound that we did not even have marked. In fact I had noted this before. Nonetheless we are going to addressed that today. 5/27; most of the patient's wounds are healed including the left Achilles heel left toe. Right forearm is also healed. She has a new abrasion posteriorly in the right elbow area just above the  olecranon this happened today with an abrasion from her car door 6/15; the patient's wounds on the left Achilles and left second toe remain  healed. The area on the right forearm is also just about healed in fact the skin I replaced last time is looking viable which is good. She had me look at the left heel again which is not in the area of the wound more towards the tip of the heel at the Achilles insertion site as well as the tip of the left foot second toe. In the Achilles area that is clearly is friction probably in bed at night. Not really near where the wound was. The left second toe remains healed although this is hammered and overhanging first toe also puts pressure in this area Objective Constitutional Sitting or standing Blood Pressure is within target range for patient.. Pulse regular and within target range for patient.Marland Kitchen Respirations regular, non- labored and within target range.. Temperature is normal and within the target range for the patient.Marland Kitchen appears in no distress. Vitals Time Taken: 10:25 AM, Height: 65.5 in, Weight: 240 lbs, BMI: 39.3, Temperature: 97.9 F, Pulse: 77 bpm, Respiratory Rate: 20 breaths/min, Blood Pressure: 139/56 mmHg. General Notes: .Wound exam; left Achilles heel remains healed second toe tip is also healed. Again she had some callus in this area which I removed there is no open wound. The area of skin tear on the right posterior arm is just about adherent the flap of skin I replaced last time seems to be viable. Just at the insertion site the area is still little Integumentary (Hair, Skin) Wound #11 status is Open. Original cause of wound was Trauma. The date acquired was: 08/16/2020. The wound has been in treatment 2 weeks. The wound is located on the Right Elbow. The wound measures 0.2cm length x 0.8cm width x 0.1cm depth; 0.126cm^2 area and 0.013cm^3 volume. There is Fat Layer (Subcutaneous Tissue) exposed. There is no tunneling or undermining noted. There is a medium amount of sanguinous drainage noted. There is small (1-33%) pink granulation within the wound bed. There is a small (1-33%) amount  of necrotic tissue within the wound bed including Eschar. Assessment Active Problems ICD-10 GEORGIE, EDUARDO. ([id number]) Type 2 diabetes mellitus with foot ulcer Venous insufficiency (chronic) (peripheral) Non-pressure chronic ulcer of other part of left foot with fat layer exposed Non-pressure chronic ulcer of other part of right lower leg with fat layer exposed Non-pressure chronic ulcer of left ankle with fat layer exposed Long term (current) use of anticoagulants Abrasion of right elbow, subsequent encounter Plan Follow-up Appointments: Return Appointment in 1 week. Home Health: [facility] for wound care. May utilize formulary equivalent dressing for wound treatment orders unless otherwise specified. Home Health Nurse may visit PRN to address patient s wound care needs. [facility] Bathing/ Shower/ Hygiene: May shower; gently cleanse wound with antibacterial soap, rinse and pat dry prior to dressing wounds Edema Control - Lymphedema / Segmental Compressive Device / Other: Elevate, Exercise Daily and Avoid Standing for Long Periods of Time. Elevate legs to the level of the heart and pump ankles as often as possible Elevate leg(s) parallel to the floor when sitting. Off-Loading: Other: - Keep pressure off of toes and heels! WOUND #11: - Elbow Wound Laterality: Right Cleanser: Normal Saline 1 x Per Day/30 Days Discharge Instructions: Wash your hands with soap and water. Remove old dressing, discard into plastic bag and place into trash. Cleanse the wound with Normal Saline prior to applying a  clean dressing using gauze sponges, not tissues or cotton balls. Do not scrub or use excessive force. Pat dry using gauze sponges, not tissue or cotton balls. Cleanser: Soap and Water 1 x Per Day/30 Days Discharge Instructions: Gently cleanse wound with antibacterial soap, rinse and pat dry prior to dressing wounds Primary Dressing: Xeroform-HBD 2x2 (in/in) (Generic) 1 x Per Day/30  Days Discharge Instructions: Apply Xeroform-HBD 2x2 (in/in) as directed Secondary Dressing: ABD Pad 5x9 (in/in) (Generic) 1 x Per Day/30 Days Discharge Instructions: Cover with ABD pad Secondary Dressing: Kerlix 4.5 x 4.1 (in/yd) (Generic) 1 x Per Day/30 Days Discharge Instructions: Apply Kerlix 4.5 x 4.1 (in/yd) as instructed Secured With: 46M Medipore H Soft Cloth Surgical Tape, 2x2 (in/yd) (Generic) 1 x Per Day/30 Days 1. There is nothing open on the left heel or tip of the second toe. I think the heel problem is continued friction probably at night. The left second toe tip is a hammer issue pressure from the overlying first toe. They are going to have to pad this with callus pads gauze something to keep that off the sole of her shoe 2. Continue with Xeroform to the contusion on the right arm I hope that this will be healed by next week Electronic Signature(s) Signed: 09/04/2020 4:10:05 PM By: Linton Ham MD Entered By: Linton Ham on 09/04/2020 11:21:54 KOREA, SEVERS ([id number]) -------------------------------------------------------------------------------- SuperBill Details Patient Name: [name] Date of Service: 09/04/2020 Medical Record Number: [id number] Patient Account Number: [id number] Date of Birth/Sex: June 16, 1950 (69 y.o. F) Treating RN: Cornell Barman Primary Care Provider: [name], Olivia Mackie Other Clinician: Referring Provider: [name], Olivia Mackie Treating Provider/Extender: Tito Dine in Treatment: 9 Diagnosis Coding ICD-10 Codes Code Description E11.621 Type 2 diabetes mellitus with foot ulcer I87.2 Venous insufficiency (chronic) (peripheral) L97.522 Non-pressure chronic ulcer of other part of left foot with fat layer exposed L97.812 Non-pressure chronic ulcer of other part of right lower leg with fat layer exposed L97.322 Non-pressure chronic ulcer of left ankle with fat layer exposed Z79.01 Long term (current) use of  anticoagulants S50.311D Abrasion of right elbow, subsequent encounter Facility Procedures CPT4 Code: 66063016 Description: 01093 - WOUND CARE VISIT-LEV 2 EST PT Modifier: Quantity: 1 Physician Procedures CPT4 Code: 2355732 Description: 99213 - WC PHYS LEVEL 3 - EST PT Modifier: Quantity: 1 CPT4 Code: Description: ICD-10 Diagnosis Description S50.311D Abrasion of right elbow, subsequent encounter L97.522 Non-pressure chronic ulcer of other part of left foot with fat layer ex Modifier: posed Quantity: Electronic Signature(s) Signed: 09/04/2020 4:10:05 PM By: Linton Ham MD Entered By: Linton Ham on 09/04/2020 11:22:27

## 2020-09-06 DIAGNOSIS — E11621 Type 2 diabetes mellitus with foot ulcer: Secondary | ICD-10-CM | POA: Diagnosis not present

## 2020-09-06 DIAGNOSIS — E1142 Type 2 diabetes mellitus with diabetic polyneuropathy: Secondary | ICD-10-CM | POA: Diagnosis not present

## 2020-09-06 DIAGNOSIS — E1151 Type 2 diabetes mellitus with diabetic peripheral angiopathy without gangrene: Secondary | ICD-10-CM | POA: Diagnosis not present

## 2020-09-06 DIAGNOSIS — L97522 Non-pressure chronic ulcer of other part of left foot with fat layer exposed: Secondary | ICD-10-CM | POA: Diagnosis not present

## 2020-09-06 DIAGNOSIS — L97422 Non-pressure chronic ulcer of left heel and midfoot with fat layer exposed: Secondary | ICD-10-CM | POA: Diagnosis not present

## 2020-09-06 DIAGNOSIS — I69319 Unspecified symptoms and signs involving cognitive functions following cerebral infarction: Secondary | ICD-10-CM | POA: Diagnosis not present

## 2020-09-06 DIAGNOSIS — E1159 Type 2 diabetes mellitus with other circulatory complications: Secondary | ICD-10-CM | POA: Diagnosis not present

## 2020-09-06 DIAGNOSIS — I872 Venous insufficiency (chronic) (peripheral): Secondary | ICD-10-CM | POA: Diagnosis not present

## 2020-09-06 DIAGNOSIS — I152 Hypertension secondary to endocrine disorders: Secondary | ICD-10-CM | POA: Diagnosis not present

## 2020-09-06 DIAGNOSIS — I4819 Other persistent atrial fibrillation: Secondary | ICD-10-CM | POA: Diagnosis not present

## 2020-09-08 DIAGNOSIS — G4733 Obstructive sleep apnea (adult) (pediatric): Secondary | ICD-10-CM | POA: Diagnosis not present

## 2020-09-09 ENCOUNTER — Other Ambulatory Visit: Payer: Self-pay | Admitting: Internal Medicine

## 2020-09-09 ENCOUNTER — Telehealth: Payer: Self-pay | Admitting: Family Medicine

## 2020-09-09 ENCOUNTER — Telehealth: Payer: Self-pay | Admitting: Internal Medicine

## 2020-09-09 DIAGNOSIS — B3731 Acute candidiasis of vulva and vagina: Secondary | ICD-10-CM

## 2020-09-09 DIAGNOSIS — L304 Erythema intertrigo: Secondary | ICD-10-CM

## 2020-09-09 DIAGNOSIS — G4733 Obstructive sleep apnea (adult) (pediatric): Secondary | ICD-10-CM | POA: Diagnosis not present

## 2020-09-09 DIAGNOSIS — B373 Candidiasis of vulva and vagina: Secondary | ICD-10-CM

## 2020-09-09 MED ORDER — FLUCONAZOLE 150 MG PO TABS: 150.0000 mg | ORAL_TABLET | Freq: Once | ORAL | 0 refills | Status: AC

## 2020-09-09 NOTE — Telephone Encounter (Signed)
Patient was treated for a UTI. Now she has a yeast infection under left breast, in groin area too. She wanted a message sent back to [place] Patient is requesting medication.

## 2020-09-09 NOTE — Telephone Encounter (Signed)
Left message to return call 

## 2020-09-09 NOTE — Telephone Encounter (Signed)
It looks like this patient had labs ordered to be done at her living facility.  I cannot tell that they have been done and I got message that her labs were overdue.  Can you check and see if she had a BMP completed at her facility?  If not this needs to be done based on her last labs.

## 2020-09-09 NOTE — Telephone Encounter (Signed)
Its hot shes likely sweating advise use otc clotrimazole in the creases 2x per day with otc hydrocortisone 2x per day  When redness and irritation gets better she will have to use gold bond talc free powder otc to keep her dry  Sent 2 pills diflucan to take one today and another 1 week from today  Inform I am out of the office until 09/17/20  If needed can be seen new issues another provider

## 2020-09-09 NOTE — Telephone Encounter (Signed)
Pt called back returning your call °

## 2020-09-09 NOTE — Telephone Encounter (Signed)
Please advise, Patient was placed on an antibiotic recently

## 2020-09-10 NOTE — Telephone Encounter (Signed)
No answer, no voicemail when calling [facility].

## 2020-09-10 NOTE — Telephone Encounter (Signed)
Patient has been notified

## 2020-09-10 NOTE — Telephone Encounter (Signed)
No answer, no voicemail.

## 2020-09-11 ENCOUNTER — Ambulatory Visit: Payer: Medicare HMO | Admitting: Internal Medicine

## 2020-09-12 DIAGNOSIS — L97522 Non-pressure chronic ulcer of other part of left foot with fat layer exposed: Secondary | ICD-10-CM | POA: Diagnosis not present

## 2020-09-12 DIAGNOSIS — E1151 Type 2 diabetes mellitus with diabetic peripheral angiopathy without gangrene: Secondary | ICD-10-CM | POA: Diagnosis not present

## 2020-09-12 DIAGNOSIS — E1159 Type 2 diabetes mellitus with other circulatory complications: Secondary | ICD-10-CM | POA: Diagnosis not present

## 2020-09-12 DIAGNOSIS — I152 Hypertension secondary to endocrine disorders: Secondary | ICD-10-CM | POA: Diagnosis not present

## 2020-09-12 DIAGNOSIS — I4819 Other persistent atrial fibrillation: Secondary | ICD-10-CM | POA: Diagnosis not present

## 2020-09-12 DIAGNOSIS — I69319 Unspecified symptoms and signs involving cognitive functions following cerebral infarction: Secondary | ICD-10-CM | POA: Diagnosis not present

## 2020-09-12 DIAGNOSIS — E11621 Type 2 diabetes mellitus with foot ulcer: Secondary | ICD-10-CM | POA: Diagnosis not present

## 2020-09-12 DIAGNOSIS — I872 Venous insufficiency (chronic) (peripheral): Secondary | ICD-10-CM | POA: Diagnosis not present

## 2020-09-12 DIAGNOSIS — E1142 Type 2 diabetes mellitus with diabetic polyneuropathy: Secondary | ICD-10-CM | POA: Diagnosis not present

## 2020-09-12 DIAGNOSIS — L97422 Non-pressure chronic ulcer of left heel and midfoot with fat layer exposed: Secondary | ICD-10-CM | POA: Diagnosis not present

## 2020-09-13 ENCOUNTER — Encounter: Payer: Medicare HMO | Admitting: Physician Assistant

## 2020-09-13 ENCOUNTER — Other Ambulatory Visit: Payer: Self-pay

## 2020-09-13 ENCOUNTER — Other Ambulatory Visit: Payer: Self-pay | Admitting: Family

## 2020-09-13 DIAGNOSIS — W228XXA Striking against or struck by other objects, initial encounter: Secondary | ICD-10-CM | POA: Diagnosis not present

## 2020-09-13 DIAGNOSIS — I152 Hypertension secondary to endocrine disorders: Secondary | ICD-10-CM | POA: Diagnosis not present

## 2020-09-13 DIAGNOSIS — L98492 Non-pressure chronic ulcer of skin of other sites with fat layer exposed: Secondary | ICD-10-CM | POA: Diagnosis not present

## 2020-09-13 DIAGNOSIS — Z7901 Long term (current) use of anticoagulants: Secondary | ICD-10-CM | POA: Diagnosis not present

## 2020-09-13 DIAGNOSIS — Z8673 Personal history of transient ischemic attack (TIA), and cerebral infarction without residual deficits: Secondary | ICD-10-CM

## 2020-09-13 DIAGNOSIS — I69319 Unspecified symptoms and signs involving cognitive functions following cerebral infarction: Secondary | ICD-10-CM | POA: Diagnosis not present

## 2020-09-13 DIAGNOSIS — L97812 Non-pressure chronic ulcer of other part of right lower leg with fat layer exposed: Secondary | ICD-10-CM | POA: Diagnosis not present

## 2020-09-13 DIAGNOSIS — I4819 Other persistent atrial fibrillation: Secondary | ICD-10-CM | POA: Diagnosis not present

## 2020-09-13 DIAGNOSIS — E1142 Type 2 diabetes mellitus with diabetic polyneuropathy: Secondary | ICD-10-CM | POA: Diagnosis not present

## 2020-09-13 DIAGNOSIS — X58XXXA Exposure to other specified factors, initial encounter: Secondary | ICD-10-CM | POA: Diagnosis not present

## 2020-09-13 DIAGNOSIS — L97522 Non-pressure chronic ulcer of other part of left foot with fat layer exposed: Secondary | ICD-10-CM | POA: Diagnosis not present

## 2020-09-13 DIAGNOSIS — E1159 Type 2 diabetes mellitus with other circulatory complications: Secondary | ICD-10-CM | POA: Diagnosis not present

## 2020-09-13 DIAGNOSIS — E11621 Type 2 diabetes mellitus with foot ulcer: Secondary | ICD-10-CM | POA: Diagnosis not present

## 2020-09-13 DIAGNOSIS — L97322 Non-pressure chronic ulcer of left ankle with fat layer exposed: Secondary | ICD-10-CM | POA: Diagnosis not present

## 2020-09-13 DIAGNOSIS — I872 Venous insufficiency (chronic) (peripheral): Secondary | ICD-10-CM | POA: Diagnosis not present

## 2020-09-13 DIAGNOSIS — S50311A Abrasion of right elbow, initial encounter: Secondary | ICD-10-CM | POA: Diagnosis not present

## 2020-09-13 DIAGNOSIS — L97422 Non-pressure chronic ulcer of left heel and midfoot with fat layer exposed: Secondary | ICD-10-CM | POA: Diagnosis not present

## 2020-09-13 DIAGNOSIS — E1151 Type 2 diabetes mellitus with diabetic peripheral angiopathy without gangrene: Secondary | ICD-10-CM | POA: Diagnosis not present

## 2020-09-13 DIAGNOSIS — I10 Essential (primary) hypertension: Secondary | ICD-10-CM | POA: Diagnosis not present

## 2020-09-13 NOTE — Progress Notes (Addendum)
[name], [name] ([id number]) Visit Report for 09/13/2020 Arrival Information Details Patient Name: Dawn, [name] Date of Service: 09/13/2020 3:45 PM Medical Record Number: [id number] Patient Account Number: [id number] Date of Birth/Sex: Apr 28, 1950 (70 y.o. F) Treating RN: Carlene Coria Primary Care Deette Revak: [name], Olivia Mackie Other Clinician: Referring Clydia Nieves: [name], Olivia Mackie Treating Ellia Knowlton/Extender: Skipper Cliche in Treatment: 11 Visit Information History Since Last Visit Added or deleted any medications: No Patient Arrived: Wheel Chair Had a fall or experienced change in No Arrival Time: 15:59 activities of daily living that may affect Accompanied By: self risk of falls: Transfer Assistance: None Hospitalized since last visit: No Patient Identification Verified: Yes Pain Present Now: Yes Secondary Verification Process Completed: Yes Patient Has Alerts: Yes Patient Alerts: Patient on Blood Thinner Eliquis Diabetic II Electronic Signature(s) Signed: 09/13/2020 4:31:49 PM By: Jeanine Luz Entered By: Jeanine Luz on 09/13/2020 16:08:42 [name] ([id number]) -------------------------------------------------------------------------------- Clinic Level of Care Assessment Details Patient Name: [name] Date of Service: 09/13/2020 3:45 PM Medical Record Number: [id number] Patient Account Number: [id number] Date of Birth/Sex: 1950/09/05 (70 y.o. F) Treating RN: Carlene Coria Primary Care Londell Noll: [name], Olivia Mackie Other Clinician: Referring Calloway Andrus: [name], Olivia Mackie Treating Thayer Inabinet/Extender: Skipper Cliche in Treatment: 11 Clinic Level of Care Assessment Items TOOL 4 Quantity Score X - Use when only an EandM is performed on FOLLOW-UP visit 1 0 ASSESSMENTS - Nursing Assessment / Reassessment _0  - Reassessment of Co-morbidities (includes updates in patient status) 0 _1  - 0 Reassessment of Adherence to Treatment  Plan ASSESSMENTS - Wound and Skin Assessment / Reassessment X - Simple Wound Assessment / Reassessment - one wound 1 5 _2  - 0 Complex Wound Assessment / Reassessment - multiple wounds _3  - 0 Dermatologic / Skin Assessment (not related to wound area) ASSESSMENTS - Focused Assessment _4  - Circumferential Edema Measurements - multi extremities 0 _5  - 0 Nutritional Assessment / Counseling / Intervention _6  - 0 Lower Extremity Assessment (monofilament, tuning fork, pulses) _7  - 0 Peripheral Arterial Disease Assessment (using hand held doppler) ASSESSMENTS - Ostomy and/or Continence Assessment and Care _8  - Incontinence Assessment and Management 0 _9  - 0 Ostomy Care Assessment and Management (repouching, etc.) PROCESS - Coordination of Care X - Simple Patient / Family Education for ongoing care 1 15 _10  - 0 Complex (extensive) Patient / Family Education for ongoing care X- 1 10 Staff obtains Programmer, systems, Records, Test Results / Process Orders _11  - 0 Staff telephones HHA, Nursing Homes / Clarify orders / etc _12  - 0 Routine Transfer to another Facility (non-emergent condition) _13  - 0 Routine Hospital Admission (non-emergent condition) _14  - 0 New Admissions / Biomedical engineer / Ordering NPWT, Apligraf, etc. _15  - 0 Emergency Hospital Admission (emergent condition) X- 1 10 Simple Discharge Coordination _16  - 0 Complex (extensive) Discharge Coordination PROCESS - Special Needs _17  - Pediatric / Minor Patient Management 0 _18  - 0 Isolation Patient Management _19  - 0 Hearing / Language / Visual special needs _20  - 0 Assessment of Community assistance (transportation, D/C planning, etc.) _21  - 0 Additional assistance / Altered mentation _22  - 0 Support Surface(s) Assessment (bed, cushion, seat, etc.) INTERVENTIONS - Wound Cleansing / Measurement [name], Dawn B. ([id number]) X- 1 5 Simple Wound Cleansing - one wound _23  - 0 Complex Wound Cleansing - multiple wounds X- 1 5 Wound  Imaging (photographs - any number of wounds) _24  - 0 Wound Tracing (instead of photographs) X- 1 5 Simple Wound Measurement - one wound _25  - 0 Complex Wound Measurement - multiple  wounds INTERVENTIONS - Wound Dressings _0  - Small Wound Dressing one or multiple wounds 0 _1  - 0 Medium Wound Dressing one or multiple wounds _2  - 0 Large Wound Dressing one or multiple wounds X- 1 5 Application of Medications - topical <BJYNWGNFAOZHYQMV>_7<\/QIONGEXBMWUXLKGM>_0  - 0 Application of Medications - injection INTERVENTIONS - Miscellaneous _4  - External ear exam 0 _5  - 0 Specimen Collection (cultures, biopsies, blood, body fluids, etc.) _6  - 0 Specimen(s) / Culture(s) sent or taken to Lab for analysis _7  - 0 Patient Transfer (multiple staff / Harrel Lemon Lift / Similar devices) _8  - 0 Simple Staple / Suture removal (25 or less) _9  - 0 Complex Staple / Suture removal (26 or more) _10  - 0 Hypo / Hyperglycemic Management (close monitor of Blood Glucose) _11  - 0 Ankle / Brachial Index (ABI) - do not check if billed separately X- 1 5 Vital Signs Has the patient been seen at the hospital within the last three years: Yes Total Score: 65 Level Of Care: New/Established - Level 2 Electronic Signature(s) Signed: 09/13/2020 4:44:14 PM By: Carlene Coria RN Entered By: Carlene Coria on 09/13/2020 16:30:40 [name] ([id number]) -------------------------------------------------------------------------------- Encounter Discharge Information Details Patient Name: [name] Date of Service: 09/13/2020 3:45 PM Medical Record Number: [id number] Patient Account Number: [id number] Date of Birth/Sex: [birth year] (70 y.o. F) Treating RN: Carlene Coria Primary Care Ellard Nan: [name], Olivia Mackie Other Clinician: Referring Dwanda Tufano: [name], Olivia Mackie Treating Omid Deardorff/Extender: Skipper Cliche in Treatment: 11 Encounter Discharge Information Items Discharge Condition: Stable Ambulatory Status: Wheelchair Discharge Destination:  Home Transportation: Private Auto Accompanied By: husband Schedule Follow-up Appointment: Yes Clinical Summary of Care: Patient Declined Electronic Signature(s) Signed: 09/13/2020 4:44:14 PM By: Carlene Coria RN Entered By: Carlene Coria on 09/13/2020 16:43:47 [name], [name] ([id number]) -------------------------------------------------------------------------------- Lower Extremity Assessment Details Patient Name: [name] Date of Service: 09/13/2020 3:45 PM Medical Record Number: [id number] Patient Account Number: [id number] Date of Birth/Sex: 1950-04-20 (70 y.o. F) Treating RN: Carlene Coria Primary Care Danea Manter: [name], Olivia Mackie Other Clinician: Referring Mylisa Brunson: [name], Olivia Mackie Treating Arlee Santosuosso/Extender: Skipper Cliche in Treatment: 11 Electronic Signature(s) Signed: 09/13/2020 4:31:49 PM By: Jeanine Luz Signed: 09/13/2020 4:44:14 PM By: Carlene Coria RN Entered By: Jeanine Luz on 09/13/2020 16:21:28 [name], [name] ([id number]) -------------------------------------------------------------------------------- Multi Wound Chart Details Patient Name: [name] Date of Service: 09/13/2020 3:45 PM Medical Record Number: [id number] Patient Account Number: [id number] Date of Birth/Sex: [birth year] (70 y.o. F) Treating RN: Carlene Coria Primary Care Aissata Wilmore: [name], Olivia Mackie Other Clinician: Referring Paisely Brick: [name], Olivia Mackie Treating Sheylin Scharnhorst/Extender: Skipper Cliche in Treatment: 11 Vital Signs Height(in): 65.5 Pulse(bpm): 99 Weight(lbs): 240 Blood Pressure(mmHg): 174/82 Body Mass Index(BMI): 39 Temperature(F): 98.2 Respiratory Rate(breaths/min): 20 Photos: [N/A:N/A] Wound Location: Right Elbow Right Forearm N/A Wounding Event: Trauma Trauma N/A Primary Etiology: Trauma, Other Trauma, Other N/A Comorbid History: Asthma, Pneumothorax, Asthma, Pneumothorax, N/A Arrhythmia, Hypertension, Type II Arrhythmia,  Hypertension, Type II Diabetes, Gout, Osteoarthritis, Diabetes, Gout, Osteoarthritis, Neuropathy Neuropathy Date Acquired: 08/16/2020 09/06/2020 N/A Weeks of Treatment: 4 0 N/A Wound Status: Open Open N/A Measurements L x W x D (cm) 0x0x0 0.5x4.9x0.1 N/A Area (cm) : 0 1.924 N/A Volume (cm) : 0 0.192 N/A % Reduction in Area: 100.00% N/A N/A % Reduction in Volume: 100.00% N/A N/A Classification: Full Thickness Without Exposed Partial Thickness N/A Support Structures Exudate Amount: None Present Medium N/A Exudate Type: N/A Sanguinous N/A Exudate Color: N/A red N/A Granulation Amount: None Present (0%) Medium (34-66%) N/A Granulation Quality: N/A Pink N/A Necrotic Amount: None Present (0%) Medium (34-66%)  N/A Exposed Structures: Fascia: No Fat Layer (Subcutaneous Tissue): N/A Fat Layer (Subcutaneous Tissue): Yes No Fascia: No Tendon: No Tendon: No Muscle: No Muscle: No Joint: No Joint: No Bone: No Bone: No Epithelialization: Large (67-100%) N/A N/A Treatment Notes Electronic Signature(s) Signed: 09/13/2020 4:44:14 PM By: Carlene Coria RN Entered By: Carlene Coria on 09/13/2020 16:27:37 [name], [name] ([id number]) -------------------------------------------------------------------------------- [place] Details Patient Name: [name] Date of Service: 09/13/2020 3:45 PM Medical Record Number: [id number] Patient Account Number: [id number] Date of Birth/Sex: March 22, 1951 (70 y.o. F) Treating RN: Carlene Coria Primary Care Makeba Delcastillo: [name], Olivia Mackie Other Clinician: Referring Cherita Hebel: [name], Olivia Mackie Treating Mathan Darroch/Extender: Skipper Cliche in Treatment: 11 Active Inactive Wound/Skin Impairment Nursing Diagnoses: Knowledge deficit related to ulceration/compromised skin integrity Goals: Patient/caregiver will verbalize understanding of skin care regimen Date Initiated: 06/28/2020 Date Inactivated: 08/16/2020 Target Resolution Date:  07/28/2020 Goal Status: Met Ulcer/skin breakdown will have a volume reduction of 30% by week 4 Date Initiated: 06/28/2020 Date Inactivated: 08/16/2020 Target Resolution Date: 07/28/2020 Goal Status: Met Ulcer/skin breakdown will have a volume reduction of 50% by week 8 Date Initiated: 06/28/2020 Date Inactivated: 09/13/2020 Target Resolution Date: 08/28/2020 Goal Status: Met Ulcer/skin breakdown will have a volume reduction of 80% by week 12 Date Initiated: 06/28/2020 Target Resolution Date: 09/27/2020 Goal Status: Active Ulcer/skin breakdown will heal within 14 weeks Date Initiated: 06/28/2020 Target Resolution Date: 10/28/2020 Goal Status: Active Interventions: Assess patient/caregiver ability to obtain necessary supplies Assess patient/caregiver ability to perform ulcer/skin care regimen upon admission and as needed Assess ulceration(s) every visit Notes: Electronic Signature(s) Signed: 09/13/2020 4:44:14 PM By: Carlene Coria RN Entered By: Carlene Coria on 09/13/2020 16:27:21 [name], [name] ([id number]) -------------------------------------------------------------------------------- Pain Assessment Details Patient Name: [name] Date of Service: 09/13/2020 3:45 PM Medical Record Number: [id number] Patient Account Number: [id number] Date of Birth/Sex: 11/26/1950 (70 y.o. F) Treating RN: Carlene Coria Primary Care Lonzie Simmer: [name], Olivia Mackie Other Clinician: Referring Vondell Sowell: [name], Olivia Mackie Treating Shiah Berhow/Extender: Skipper Cliche in Treatment: 11 Active Problems Location of Pain Severity and Description of Pain Patient Has Paino Yes Site Locations Rate the pain. Current Pain Level: 5 Pain Management and Medication Current Pain Management: Electronic Signature(s) Signed: 09/13/2020 4:31:49 PM By: Jeanine Luz Signed: 09/13/2020 4:44:14 PM By: Carlene Coria RN Entered By: Jeanine Luz on 09/13/2020 16:09:16 [name]  ([id number]) -------------------------------------------------------------------------------- Patient/Caregiver Education Details Patient Name: [name] Date of Service: 09/13/2020 3:45 PM Medical Record Number: [id number] Patient Account Number: [id number] Date of Birth/Gender: 1951/02/12 (70 y.o. F) Treating RN: Carlene Coria Primary Care Physician: [name], Olivia Mackie Other Clinician: Referring Physician: [name], Olivia Mackie Treating Physician/Extender: Skipper Cliche in Treatment: 11 Education Assessment Education Provided To: Patient Education Topics Provided Wound/Skin Impairment: Methods: Explain/Verbal Responses: State content correctly Electronic Signature(s) Signed: 09/13/2020 4:44:14 PM By: Carlene Coria RN Entered By: Carlene Coria on 09/13/2020 16:31:00 [name] ([id number]) -------------------------------------------------------------------------------- Wound Assessment Details Patient Name: [name] Date of Service: 09/13/2020 3:45 PM Medical Record Number: [id number] Patient Account Number: [id number] Date of Birth/Sex: 25-Nov-1950 (70 y.o. F) Treating RN: Carlene Coria Primary Care Beyonca Wisz: [name], Olivia Mackie Other Clinician: Referring Victoriah Wilds: [name], Olivia Mackie Treating Airica Schwartzkopf/Extender: Skipper Cliche in Treatment: 11 Wound Status Wound Number: 11 Primary Trauma, Other Etiology: Wound Location: Right Elbow Wound Open Wounding Event: Trauma Status: Date Acquired: 08/16/2020 Comorbid Asthma, Pneumothorax, Arrhythmia, Hypertension, Type Weeks Of Treatment: 4 History: II Diabetes, Gout, Osteoarthritis, Neuropathy Clustered Wound: No Photos Wound Measurements Length: (cm) 0 Width: (cm) 0 Depth: (cm) 0 Area: (cm) 0  Volume: (cm) 0 % Reduction in Area: 100% % Reduction in Volume: 100% Epithelialization: Large (67-100%) Tunneling: No Undermining: No Wound Description Classification: Full Thickness Without  Exposed Support Structure Exudate Amount: None Present s Foul Odor After Cleansing: No Slough/Fibrino No Wound Bed Granulation Amount: None Present (0%) Exposed Structure Necrotic Amount: None Present (0%) Fascia Exposed: No Fat Layer (Subcutaneous Tissue) Exposed: No Tendon Exposed: No Muscle Exposed: No Joint Exposed: No Bone Exposed: No Electronic Signature(s) Signed: 09/13/2020 4:44:14 PM By: Carlene Coria RN Entered By: Carlene Coria on 09/13/2020 16:26:38 [name], [name] ([id number]) -------------------------------------------------------------------------------- Wound Assessment Details Patient Name: [name] Date of Service: 09/13/2020 3:45 PM Medical Record Number: [id number] Patient Account Number: [id number] Date of Birth/Sex: September 19, 1950 (70 y.o. F) Treating RN: Carlene Coria Primary Care Mikaylee Arseneau: [name], Olivia Mackie Other Clinician: Referring Carlton Sweaney: [name], Olivia Mackie Treating Luan Maberry/Extender: Skipper Cliche in Treatment: 11 Wound Status Wound Number: 12 Primary Trauma, Other Etiology: Wound Location: Right Forearm Wound Open Wounding Event: Trauma Status: Date Acquired: 09/06/2020 Comorbid Asthma, Pneumothorax, Arrhythmia, Hypertension, Type Weeks Of Treatment: 0 History: II Diabetes, Gout, Osteoarthritis, Neuropathy Clustered Wound: No Photos Wound Measurements Length: (cm) 0.5 Width: (cm) 4.9 Depth: (cm) 0.1 Area: (cm) 1.924 Volume: (cm) 0.192 % Reduction in Area: % Reduction in Volume: Tunneling: No Undermining: No Wound Description Classification: Partial Thickness Exudate Amount: Medium Exudate Type: Sanguinous Exudate Color: red Foul Odor After Cleansing: No Slough/Fibrino Yes Wound Bed Granulation Amount: Medium (34-66%) Exposed Structure Granulation Quality: Pink Fascia Exposed: No Necrotic Amount: Medium (34-66%) Fat Layer (Subcutaneous Tissue) Exposed: Yes Necrotic Quality: Adherent Slough Tendon Exposed:  No Muscle Exposed: No Joint Exposed: No Bone Exposed: No Treatment Notes Wound #12 (Forearm) Wound Laterality: Right Cleanser Soap and Water Discharge Instruction: Gently cleanse wound with antibacterial soap, rinse and pat dry prior to dressing wounds Peri-Wound Care [name], Dawn B. ([id number]) Topical Primary Dressing Xeroform-HBD 2x2 (in/in) Discharge Instruction: Apply Xeroform-HBD 2x2 (in/in) as directed Secondary Dressing ABD Pad 5x9 (in/in) Discharge Instruction: Cover with ABD pad Secured With Stretch Net Size 10 (yds) Discharge Instruction: To secure dressing in place. Compression Wrap Compression Stockings Add-Ons Electronic Signature(s) Signed: 09/13/2020 4:31:49 PM By: Jeanine Luz Signed: 09/13/2020 4:44:14 PM By: Carlene Coria RN Entered By: Jeanine Luz on 09/13/2020 16:20:38 [name], [name] ([id number]) -------------------------------------------------------------------------------- Vitals Details Patient Name: [name] Date of Service: 09/13/2020 3:45 PM Medical Record Number: [id number] Patient Account Number: [id number] Date of Birth/Sex: 02-12-1951 (70 y.o. F) Treating RN: Carlene Coria Primary Care Talia Hoheisel: [name], Olivia Mackie Other Clinician: Referring Lillyona Polasek: [name], Olivia Mackie Treating Lyfe Reihl/Extender: Skipper Cliche in Treatment: 11 Vital Signs Time Taken: 04:00 Temperature (F): 98.2 Height (in): 65.5 Pulse (bpm): 99 Weight (lbs): 240 Respiratory Rate (breaths/min): 20 Body Mass Index (BMI): 39.3 Blood Pressure (mmHg): 174/82 Reference Range: 80 - 120 mg / dl Electronic Signature(s) Signed: 09/13/2020 4:31:49 PM By: Jeanine Luz Entered By: Jeanine Luz on 09/13/2020 16:09:06

## 2020-09-13 NOTE — Progress Notes (Addendum)
MELLONIE, GUESS ([id number]) Visit Report for 09/13/2020 Chief Complaint Document Details Patient Name: [name], [name] Date of Service: 09/13/2020 3:45 PM Medical Record Number: [id number] Patient Account Number: [id number] Date of Birth/Sex: 03/05/1951 (70 y.o. F) Treating RN: Carlene Coria Primary Care Provider: [name], Olivia Mackie Other Clinician: Referring Provider: [name], Olivia Mackie Treating Provider/Extender: Skipper Cliche in Treatment: 11 Information Obtained from: Patient Chief Complaint Right Elbow Ulcer Electronic Signature(s) Signed: 09/13/2020 4:00:11 PM By: Worthy Keeler PA-C Previous Signature: 09/13/2020 3:59:33 PM Version By: Worthy Keeler PA-C Entered By: Worthy Keeler on 09/13/2020 16:00:11 [name], [name] ([id number]) -------------------------------------------------------------------------------- HPI Details Patient Name: [name] Date of Service: 09/13/2020 3:45 PM Medical Record Number: [id number] Patient Account Number: [id number] Date of Birth/Sex: 08/23/1950 (70 y.o. F) Treating RN: Carlene Coria Primary Care Provider: [name], Olivia Mackie Other Clinician: Referring Provider: [name], Olivia Mackie Treating Provider/Extender: Skipper Cliche in Treatment: 11 History of Present Illness HPI Description: 06/28/20 upon evaluation today patient appears to be doing unfortunately somewhat poorly in regard to wounds that she has over her lower extremities. She has a wound on the left distal second toe, left posterior heel, and right anterior lower leg. With that being said all in all none of the wounds appear to be too bad the one that hurts her the most is actually the wound on the posterior heel which actually is also one of the smallest. Nonetheless I think there is some callus hiding what is really going on in this area. Fortunately there does not appear to be any obvious evidence of infection which is great news. Patient does have a  history of diabetes mellitus type 2, chronic venous insufficiency, hypertension, history of DVTs, and is on long-term anticoagulant therapy, Eliquis. 07/05/20 upon evaluation today patient appears to be doing well currently with regard to her wounds. I feel like she is making progress which is great news and overall there is no signs of active infection at this time. No fevers, chills, nausea, vomiting, or diarrhea. 4/29; patient has a only a small area remaining on the tip of her left second toe and a small area remaining on the left heel. The area on the right anterior lower leg is healed. They tell me that she had remote podiatric surgery on the toes of the left foot however they are very fixed in position and I wonder whether they are making it difficult to relieve pressure in her foot wear. She is a type II diabetic with neuropathy as well. 08/01/2020 upon evaluation today patient appears to be doing well currently in regard to her wounds. In fact everything appears to be doing much better. The posterior aspect of her heel is actually giving her some trouble not the Achilles area but a small wound that we did not even have marked. In fact I had noted this before. Nonetheless we are going to addressed that today. 5/27; most of the patient's wounds are healed including the left Achilles heel left toe. Right forearm is also healed. She has a new abrasion posteriorly in the right elbow area just above the olecranon this happened today with an abrasion from her car door 6/15; the patient's wounds on the left Achilles and left second toe remain healed. The area on the right forearm is also just about healed in fact the skin I replaced last time is looking viable which is good. She had me look at the left heel again which is not in the area of the wound more  towards the tip of the heel at the Achilles insertion site as well as the tip of the left foot second toe. In the Achilles area that is clearly is  friction probably in bed at night. Not really near where the wound was. The left second toe remains healed although this is hammered and overhanging first toe also puts pressure in this area 09/13/2020 upon evaluation today patient appears to be doing well with regard to all previous wounds that she had. Everything is completely healed. With that being said I do think that the patient unfortunately has a new skin tear on the right forearm which is due to her put a Band-Aid on it and then when it pulled off this caused some issues with skin tearing. Nonetheless I think that this needs to be addressed today. Hopefully will take too long to get this to heal. She is having some issues with pain in her heel but I think this is more related to be honest to her neuropathy as opposed to anything else. Electronic Signature(s) Signed: 09/13/2020 5:05:59 PM By: Worthy Keeler PA-C Entered By: Worthy Keeler on 09/13/2020 17:05:59 [name], [name] ([id number]) -------------------------------------------------------------------------------- Physical Exam Details Patient Name: [name] Date of Service: 09/13/2020 3:45 PM Medical Record Number: [id number] Patient Account Number: [id number] Date of Birth/Sex: 11/08/1950 (70 y.o. F) Treating RN: Carlene Coria Primary Care Provider: [name], Olivia Mackie Other Clinician: Referring Provider: [name], Olivia Mackie Treating Provider/Extender: Skipper Cliche in Treatment: 70 Constitutional Well-nourished and well-hydrated in no acute distress. Respiratory normal breathing without difficulty. Psychiatric this patient is able to make decisions and demonstrates good insight into disease process. Alert and Oriented x 3. pleasant and cooperative. Notes Upon inspection patient's wound bed actually showed signs of good granulation epithelization there was minimal slough noted I was able to clean this away with saline and gauze she tolerated that today  without complication postdebridement wound bed appears to be doing much better which is great news. Electronic Signature(s) Signed: 09/13/2020 5:06:25 PM By: Worthy Keeler PA-C Entered By: Worthy Keeler on 09/13/2020 17:06:25 [name], [name] ([id number]) -------------------------------------------------------------------------------- Physician Orders Details Patient Name: [name] Date of Service: 09/13/2020 3:45 PM Medical Record Number: [id number] Patient Account Number: [id number] Date of Birth/Sex: 1950/11/16 (70 y.o. F) Treating RN: Carlene Coria Primary Care Provider: [name], Olivia Mackie Other Clinician: Referring Provider: [name], Olivia Mackie Treating Provider/Extender: Skipper Cliche in Treatment: 11 Verbal / Phone Orders: No Diagnosis Coding ICD-10 Coding Code Description E11.621 Type 2 diabetes mellitus with foot ulcer I87.2 Venous insufficiency (chronic) (peripheral) L97.522 Non-pressure chronic ulcer of other part of left foot with fat layer exposed L97.812 Non-pressure chronic ulcer of other part of right lower leg with fat layer exposed L97.322 Non-pressure chronic ulcer of left ankle with fat layer exposed Z79.01 Long term (current) use of anticoagulants S50.311D Abrasion of right elbow, subsequent encounter Follow-up Appointments o Return Appointment in 1 week. [facility] for wound care. May utilize formulary equivalent dressing for wound treatment orders unless otherwise specified. Home Health Nurse may visit PRN to address patientos wound care needs. [facility] Bathing/ Shower/ Hygiene o May shower; gently cleanse wound with antibacterial soap, rinse and pat dry prior to dressing wounds Edema Control - Lymphedema / Segmental Compressive Device / Other o Elevate, Exercise Daily and Avoid Standing for Long Periods of Time. o Elevate legs to the level of the heart and pump ankles as often as possible o Elevate  leg(s) parallel to  the floor when sitting. Off-Loading o Other: - Keep pressure off of toes and heels! Wound Treatment Wound #12 - Forearm Wound Laterality: Right Cleanser: Soap and Water 3 x Per Week/30 Days Discharge Instructions: Gently cleanse wound with antibacterial soap, rinse and pat dry prior to dressing wounds Primary Dressing: Xeroform-HBD 2x2 (in/in) 3 x Per Week/30 Days Discharge Instructions: Apply Xeroform-HBD 2x2 (in/in) as directed Secondary Dressing: ABD Pad 5x9 (in/in) 3 x Per Week/30 Days Discharge Instructions: Cover with ABD pad Secured With: Stretch Net Size 10 (yds) 3 x Per Week/30 Days Discharge Instructions: To secure dressing in place. Electronic Signature(s) Signed: 09/13/2020 4:44:14 PM By: Carlene Coria RN Signed: 09/13/2020 6:06:17 PM By: Worthy Keeler PA-C Entered By: Carlene Coria on 09/13/2020 16:30:06 [name], [name] ([id number]) -------------------------------------------------------------------------------- Problem List Details Patient Name: [name] Date of Service: 09/13/2020 3:45 PM Medical Record Number: [id number] Patient Account Number: [id number] Date of Birth/Sex: Aug 12, 1950 (70 y.o. F) Treating RN: Carlene Coria Primary Care Provider: [name], Olivia Mackie Other Clinician: Referring Provider: [name], Olivia Mackie Treating Provider/Extender: Skipper Cliche in Treatment: 11 Active Problems ICD-10 Encounter Code Description Active Date MDM Diagnosis E11.621 Type 2 diabetes mellitus with foot ulcer 06/28/2020 No Yes I87.2 Venous insufficiency (chronic) (peripheral) 06/28/2020 No Yes L97.522 Non-pressure chronic ulcer of other part of left foot with fat layer 06/28/2020 No Yes exposed L97.812 Non-pressure chronic ulcer of other part of right lower leg with fat layer 06/28/2020 No Yes exposed L97.322 Non-pressure chronic ulcer of left ankle with fat layer exposed 06/28/2020 No Yes Z79.01 Long term (current) use of anticoagulants  06/28/2020 No Yes S50.311D Abrasion of right elbow, subsequent encounter 08/16/2020 No Yes Inactive Problems ICD-10 Code Description Active Date Inactive Date I10 Essential (primary) hypertension 06/28/2020 06/28/2020 Z86.718 Personal history of other venous thrombosis and embolism 06/28/2020 06/28/2020 Resolved Problems Electronic Signature(s) Signed: 09/13/2020 3:59:25 PM By: Worthy Keeler PA-C Entered By: Worthy Keeler on 09/13/2020 15:59:25 [name], [name] ([id number]) -------------------------------------------------------------------------------- Progress Note Details Patient Name: [name] Date of Service: 09/13/2020 3:45 PM Medical Record Number: [id number] Patient Account Number: [id number] Date of Birth/Sex: June 15, 1950 (70 y.o. F) Treating RN: Carlene Coria Primary Care Provider: [name], Olivia Mackie Other Clinician: Referring Provider: [name], Olivia Mackie Treating Provider/Extender: Skipper Cliche in Treatment: 11 Subjective Chief Complaint Information obtained from Patient Right Elbow Ulcer History of Present Illness (HPI) 06/28/20 upon evaluation today patient appears to be doing unfortunately somewhat poorly in regard to wounds that she has over her lower extremities. She has a wound on the left distal second toe, left posterior heel, and right anterior lower leg. With that being said all in all none of the wounds appear to be too bad the one that hurts her the most is actually the wound on the posterior heel which actually is also one of the smallest. Nonetheless I think there is some callus hiding what is really going on in this area. Fortunately there does not appear to be any obvious evidence of infection which is great news. Patient does have a history of diabetes mellitus type 2, chronic venous insufficiency, hypertension, history of DVTs, and is on long-term anticoagulant therapy, Eliquis. 07/05/20 upon evaluation today patient appears to be doing well  currently with regard to her wounds. I feel like she is making progress which is great news and overall there is no signs of active infection at this time. No fevers, chills, nausea, vomiting, or diarrhea. 4/29; patient has a only a small area remaining on the tip of  her left second toe and a small area remaining on the left heel. The area on the right anterior lower leg is healed. They tell me that she had remote podiatric surgery on the toes of the left foot however they are very fixed in position and I wonder whether they are making it difficult to relieve pressure in her foot wear. She is a type II diabetic with neuropathy as well. 08/01/2020 upon evaluation today patient appears to be doing well currently in regard to her wounds. In fact everything appears to be doing much better. The posterior aspect of her heel is actually giving her some trouble not the Achilles area but a small wound that we did not even have marked. In fact I had noted this before. Nonetheless we are going to addressed that today. 5/27; most of the patient's wounds are healed including the left Achilles heel left toe. Right forearm is also healed. She has a new abrasion posteriorly in the right elbow area just above the olecranon this happened today with an abrasion from her car door 6/15; the patient's wounds on the left Achilles and left second toe remain healed. The area on the right forearm is also just about healed in fact the skin I replaced last time is looking viable which is good. She had me look at the left heel again which is not in the area of the wound more towards the tip of the heel at the Achilles insertion site as well as the tip of the left foot second toe. In the Achilles area that is clearly is friction probably in bed at night. Not really near where the wound was. The left second toe remains healed although this is hammered and overhanging first toe also puts pressure in this area 09/13/2020 upon  evaluation today patient appears to be doing well with regard to all previous wounds that she had. Everything is completely healed. With that being said I do think that the patient unfortunately has a new skin tear on the right forearm which is due to her put a Band-Aid on it and then when it pulled off this caused some issues with skin tearing. Nonetheless I think that this needs to be addressed today. Hopefully will take too long to get this to heal. She is having some issues with pain in her heel but I think this is more related to be honest to her neuropathy as opposed to anything else. Objective Constitutional Well-nourished and well-hydrated in no acute distress. Vitals Time Taken: 4:00 AM, Height: 65.5 in, Weight: 240 lbs, BMI: 39.3, Temperature: 98.2 F, Pulse: 99 bpm, Respiratory Rate: 20 breaths/min, Blood Pressure: 174/82 mmHg. Respiratory normal breathing without difficulty. Psychiatric this patient is able to make decisions and demonstrates good insight into disease process. Alert and Oriented x 3. pleasant and cooperative. General Notes: Upon inspection patient's wound bed actually showed signs of good granulation epithelization there was minimal slough noted I was able to clean this away with saline and gauze she tolerated that today without complication postdebridement wound bed appears to be doing much better which is great news. [name], Dawn B. ([id number]) Integumentary (Hair, Skin) Wound #11 status is Open. Original cause of wound was Trauma. The date acquired was: 08/16/2020. The wound has been in treatment 4 weeks. The wound is located on the Right Elbow. The wound measures 0cm length x 0cm width x 0cm depth; 0cm^2 area and 0cm^3 volume. There is no tunneling or undermining noted. There is a none present  amount of drainage noted. There is no granulation within the wound bed. There is no necrotic tissue within the wound bed. Wound #12 status is Open. Original cause of  wound was Trauma. The date acquired was: 09/06/2020. The wound is located on the Right Forearm. The wound measures 0.5cm length x 4.9cm width x 0.1cm depth; 1.924cm^2 area and 0.192cm^3 volume. There is Fat Layer (Subcutaneous Tissue) exposed. There is no tunneling or undermining noted. There is a medium amount of sanguinous drainage noted. There is medium (34- 66%) pink granulation within the wound bed. There is a medium (34-66%) amount of necrotic tissue within the wound bed including Adherent Slough. Assessment Active Problems ICD-10 Type 2 diabetes mellitus with foot ulcer Venous insufficiency (chronic) (peripheral) Non-pressure chronic ulcer of other part of left foot with fat layer exposed Non-pressure chronic ulcer of other part of right lower leg with fat layer exposed Non-pressure chronic ulcer of left ankle with fat layer exposed Long term (current) use of anticoagulants Abrasion of right elbow, subsequent encounter Plan Follow-up Appointments: Return Appointment in 1 week. Home Health: [facility] for wound care. May utilize formulary equivalent dressing for wound treatment orders unless otherwise specified. Home Health Nurse may visit PRN to address patient s wound care needs. [facility] Bathing/ Shower/ Hygiene: May shower; gently cleanse wound with antibacterial soap, rinse and pat dry prior to dressing wounds Edema Control - Lymphedema / Segmental Compressive Device / Other: Elevate, Exercise Daily and Avoid Standing for Long Periods of Time. Elevate legs to the level of the heart and pump ankles as often as possible Elevate leg(s) parallel to the floor when sitting. Off-Loading: Other: - Keep pressure off of toes and heels! WOUND #12: - Forearm Wound Laterality: Right Cleanser: Soap and Water 3 x Per Week/30 Days Discharge Instructions: Gently cleanse wound with antibacterial soap, rinse and pat dry prior to dressing wounds Primary Dressing: Xeroform-HBD 2x2  (in/in) 3 x Per Week/30 Days Discharge Instructions: Apply Xeroform-HBD 2x2 (in/in) as directed Secondary Dressing: ABD Pad 5x9 (in/in) 3 x Per Week/30 Days Discharge Instructions: Cover with ABD pad Secured With: Stretch Net Size 10 (yds) 3 x Per Week/30 Days Discharge Instructions: To secure dressing in place. 1. Would recommend that we going continue with the wound care measures as before and the patient is in agreement with the plan. This includes the use of Xeroform gauze. I think this is a good option. We will follow this with using ABD pads and stretching at home to secure in place. 2. I am also can recommend the patient continue to protect her heel I do not see any signs of anything threatening open but nonetheless I do think that this is something that needs to be monitored for. She does appear to have some issues with neuropathy I think that is the biggest issue here. We will see patient back for reevaluation in 1 week here in the clinic. If anything worsens or changes patient will contact our office for additional recommendations. Electronic Signature(s) Signed: 09/13/2020 5:07:57 PM By: Worthy Keeler PA-C Entered By: Worthy Keeler on 09/13/2020 17:07:57 [name], [name] ([id number], [name] ([id number]) -------------------------------------------------------------------------------- SuperBill Details Patient Name: [name] Date of Service: 09/13/2020 Medical Record Number: [id number] Patient Account Number: [id number] Date of Birth/Sex: [birth year] (70 y.o. F) Treating RN: Carlene Coria Primary Care Provider: [name], Olivia Mackie Other Clinician: Referring Provider: [name], Olivia Mackie Treating Provider/Extender: Skipper Cliche in Treatment: 11 Diagnosis Coding ICD-10 Codes Code Description E11.621 Type 2  diabetes mellitus with foot ulcer I87.2 Venous insufficiency (chronic) (peripheral) L97.522 Non-pressure chronic ulcer of other part of left  foot with fat layer exposed L97.812 Non-pressure chronic ulcer of other part of right lower leg with fat layer exposed L97.322 Non-pressure chronic ulcer of left ankle with fat layer exposed Z79.01 Long term (current) use of anticoagulants S50.311D Abrasion of right elbow, subsequent encounter Facility Procedures CPT4 Code: [id number] Description: [phone] - WOUND CARE VISIT-LEV 2 EST PT Modifier: Quantity: 1 Physician Procedures CPT4 Code: [id number] Description: 66599 - WC PHYS LEVEL 3 - EST PT Modifier: Quantity: 1 CPT4 Code: Description: ICD-10 Diagnosis Description E11.621 Type 2 diabetes mellitus with foot ulcer I87.2 Venous insufficiency (chronic) (peripheral) L97.522 Non-pressure chronic ulcer of other part of left foot with fat layer ex L97.812 Non-pressure chronic  ulcer of other part of right lower leg with fat la Modifier: posed yer exposed Quantity: Electronic Signature(s) Signed: 09/13/2020 5:08:15 PM By: Worthy Keeler PA-C Previous Signature: 09/13/2020 4:44:14 PM Version By: Carlene Coria RN Entered By: Worthy Keeler on 09/13/2020 17:08:15

## 2020-09-18 ENCOUNTER — Ambulatory Visit: Payer: Medicare HMO | Admitting: Internal Medicine

## 2020-09-19 DIAGNOSIS — E1151 Type 2 diabetes mellitus with diabetic peripheral angiopathy without gangrene: Secondary | ICD-10-CM | POA: Diagnosis not present

## 2020-09-19 DIAGNOSIS — E1142 Type 2 diabetes mellitus with diabetic polyneuropathy: Secondary | ICD-10-CM | POA: Diagnosis not present

## 2020-09-19 DIAGNOSIS — E1159 Type 2 diabetes mellitus with other circulatory complications: Secondary | ICD-10-CM | POA: Diagnosis not present

## 2020-09-19 DIAGNOSIS — L97422 Non-pressure chronic ulcer of left heel and midfoot with fat layer exposed: Secondary | ICD-10-CM | POA: Diagnosis not present

## 2020-09-19 DIAGNOSIS — I4819 Other persistent atrial fibrillation: Secondary | ICD-10-CM | POA: Diagnosis not present

## 2020-09-19 DIAGNOSIS — I152 Hypertension secondary to endocrine disorders: Secondary | ICD-10-CM | POA: Diagnosis not present

## 2020-09-19 DIAGNOSIS — I872 Venous insufficiency (chronic) (peripheral): Secondary | ICD-10-CM | POA: Diagnosis not present

## 2020-09-19 DIAGNOSIS — E11621 Type 2 diabetes mellitus with foot ulcer: Secondary | ICD-10-CM | POA: Diagnosis not present

## 2020-09-19 DIAGNOSIS — I69319 Unspecified symptoms and signs involving cognitive functions following cerebral infarction: Secondary | ICD-10-CM | POA: Diagnosis not present

## 2020-09-19 DIAGNOSIS — L97522 Non-pressure chronic ulcer of other part of left foot with fat layer exposed: Secondary | ICD-10-CM | POA: Diagnosis not present

## 2020-09-20 DIAGNOSIS — I69319 Unspecified symptoms and signs involving cognitive functions following cerebral infarction: Secondary | ICD-10-CM | POA: Diagnosis not present

## 2020-09-20 DIAGNOSIS — I872 Venous insufficiency (chronic) (peripheral): Secondary | ICD-10-CM | POA: Diagnosis not present

## 2020-09-20 DIAGNOSIS — I152 Hypertension secondary to endocrine disorders: Secondary | ICD-10-CM | POA: Diagnosis not present

## 2020-09-20 DIAGNOSIS — E11621 Type 2 diabetes mellitus with foot ulcer: Secondary | ICD-10-CM | POA: Diagnosis not present

## 2020-09-20 DIAGNOSIS — E1159 Type 2 diabetes mellitus with other circulatory complications: Secondary | ICD-10-CM | POA: Diagnosis not present

## 2020-09-20 DIAGNOSIS — L97422 Non-pressure chronic ulcer of left heel and midfoot with fat layer exposed: Secondary | ICD-10-CM | POA: Diagnosis not present

## 2020-09-20 DIAGNOSIS — L97522 Non-pressure chronic ulcer of other part of left foot with fat layer exposed: Secondary | ICD-10-CM | POA: Diagnosis not present

## 2020-09-20 DIAGNOSIS — I4819 Other persistent atrial fibrillation: Secondary | ICD-10-CM | POA: Diagnosis not present

## 2020-09-20 DIAGNOSIS — E1151 Type 2 diabetes mellitus with diabetic peripheral angiopathy without gangrene: Secondary | ICD-10-CM | POA: Diagnosis not present

## 2020-09-20 DIAGNOSIS — E1142 Type 2 diabetes mellitus with diabetic polyneuropathy: Secondary | ICD-10-CM | POA: Diagnosis not present

## 2020-09-25 ENCOUNTER — Ambulatory Visit: Payer: Medicare HMO | Admitting: Internal Medicine

## 2020-09-27 DIAGNOSIS — I4819 Other persistent atrial fibrillation: Secondary | ICD-10-CM | POA: Diagnosis not present

## 2020-09-27 DIAGNOSIS — E1159 Type 2 diabetes mellitus with other circulatory complications: Secondary | ICD-10-CM | POA: Diagnosis not present

## 2020-09-27 DIAGNOSIS — E1151 Type 2 diabetes mellitus with diabetic peripheral angiopathy without gangrene: Secondary | ICD-10-CM | POA: Diagnosis not present

## 2020-09-27 DIAGNOSIS — L97422 Non-pressure chronic ulcer of left heel and midfoot with fat layer exposed: Secondary | ICD-10-CM | POA: Diagnosis not present

## 2020-09-27 DIAGNOSIS — I152 Hypertension secondary to endocrine disorders: Secondary | ICD-10-CM | POA: Diagnosis not present

## 2020-09-27 DIAGNOSIS — E11621 Type 2 diabetes mellitus with foot ulcer: Secondary | ICD-10-CM | POA: Diagnosis not present

## 2020-09-27 DIAGNOSIS — G8194 Hemiplegia, unspecified affecting left nondominant side: Secondary | ICD-10-CM | POA: Diagnosis not present

## 2020-09-27 DIAGNOSIS — I69319 Unspecified symptoms and signs involving cognitive functions following cerebral infarction: Secondary | ICD-10-CM | POA: Diagnosis not present

## 2020-09-27 DIAGNOSIS — E1142 Type 2 diabetes mellitus with diabetic polyneuropathy: Secondary | ICD-10-CM | POA: Diagnosis not present

## 2020-09-27 DIAGNOSIS — I872 Venous insufficiency (chronic) (peripheral): Secondary | ICD-10-CM | POA: Diagnosis not present

## 2020-10-02 ENCOUNTER — Telehealth: Payer: Self-pay | Admitting: Internal Medicine

## 2020-10-02 DIAGNOSIS — I872 Venous insufficiency (chronic) (peripheral): Secondary | ICD-10-CM | POA: Diagnosis not present

## 2020-10-02 DIAGNOSIS — E11621 Type 2 diabetes mellitus with foot ulcer: Secondary | ICD-10-CM | POA: Diagnosis not present

## 2020-10-02 DIAGNOSIS — E1151 Type 2 diabetes mellitus with diabetic peripheral angiopathy without gangrene: Secondary | ICD-10-CM | POA: Diagnosis not present

## 2020-10-02 DIAGNOSIS — E1142 Type 2 diabetes mellitus with diabetic polyneuropathy: Secondary | ICD-10-CM | POA: Diagnosis not present

## 2020-10-02 DIAGNOSIS — I69319 Unspecified symptoms and signs involving cognitive functions following cerebral infarction: Secondary | ICD-10-CM | POA: Diagnosis not present

## 2020-10-02 DIAGNOSIS — I4819 Other persistent atrial fibrillation: Secondary | ICD-10-CM | POA: Diagnosis not present

## 2020-10-02 DIAGNOSIS — I152 Hypertension secondary to endocrine disorders: Secondary | ICD-10-CM | POA: Diagnosis not present

## 2020-10-02 DIAGNOSIS — L97422 Non-pressure chronic ulcer of left heel and midfoot with fat layer exposed: Secondary | ICD-10-CM | POA: Diagnosis not present

## 2020-10-02 DIAGNOSIS — G8194 Hemiplegia, unspecified affecting left nondominant side: Secondary | ICD-10-CM | POA: Diagnosis not present

## 2020-10-02 DIAGNOSIS — E1159 Type 2 diabetes mellitus with other circulatory complications: Secondary | ICD-10-CM | POA: Diagnosis not present

## 2020-10-02 NOTE — Telephone Encounter (Signed)
Can pulm do virtual before 10/07/20 pt has persistent asthma currently possibly a flare?

## 2020-10-02 NOTE — Telephone Encounter (Signed)
Lm to offer appt for 10/07/2020 at 8:30

## 2020-10-02 NOTE — Telephone Encounter (Signed)
Appt scheduled for 10/07/2020 at 8:30. Patient is aware and voiced her understanding.  Nothing further needed at this time.

## 2020-10-02 NOTE — Telephone Encounter (Signed)
This patient is coughing up phlegm she has some crackles in her left upper lobe. Nurse stated she did no see the phlegm but according to the patient it has a green tinge .

## 2020-10-02 NOTE — Telephone Encounter (Signed)
Patient has not been seen in well over a year and saw her only once at that time.We can try to get her in on the 18th as I may have had a cancellation.

## 2020-10-02 NOTE — Telephone Encounter (Signed)
FYI patient has appointment with PCP.

## 2020-10-07 ENCOUNTER — Ambulatory Visit: Payer: Self-pay | Admitting: Urology

## 2020-10-07 ENCOUNTER — Ambulatory Visit: Payer: Medicare HMO | Admitting: Pulmonary Disease

## 2020-10-08 ENCOUNTER — Telehealth: Payer: Medicare HMO | Admitting: Internal Medicine

## 2020-10-09 ENCOUNTER — Telehealth: Payer: Self-pay

## 2020-10-09 ENCOUNTER — Other Ambulatory Visit: Payer: Self-pay | Admitting: Internal Medicine

## 2020-10-09 DIAGNOSIS — E1151 Type 2 diabetes mellitus with diabetic peripheral angiopathy without gangrene: Secondary | ICD-10-CM | POA: Diagnosis not present

## 2020-10-09 DIAGNOSIS — I872 Venous insufficiency (chronic) (peripheral): Secondary | ICD-10-CM | POA: Diagnosis not present

## 2020-10-09 DIAGNOSIS — I152 Hypertension secondary to endocrine disorders: Secondary | ICD-10-CM | POA: Diagnosis not present

## 2020-10-09 DIAGNOSIS — G8194 Hemiplegia, unspecified affecting left nondominant side: Secondary | ICD-10-CM | POA: Diagnosis not present

## 2020-10-09 DIAGNOSIS — M797 Fibromyalgia: Secondary | ICD-10-CM

## 2020-10-09 DIAGNOSIS — E1142 Type 2 diabetes mellitus with diabetic polyneuropathy: Secondary | ICD-10-CM | POA: Diagnosis not present

## 2020-10-09 DIAGNOSIS — E11621 Type 2 diabetes mellitus with foot ulcer: Secondary | ICD-10-CM | POA: Diagnosis not present

## 2020-10-09 DIAGNOSIS — E1159 Type 2 diabetes mellitus with other circulatory complications: Secondary | ICD-10-CM | POA: Diagnosis not present

## 2020-10-09 DIAGNOSIS — I4819 Other persistent atrial fibrillation: Secondary | ICD-10-CM | POA: Diagnosis not present

## 2020-10-09 DIAGNOSIS — I69319 Unspecified symptoms and signs involving cognitive functions following cerebral infarction: Secondary | ICD-10-CM | POA: Diagnosis not present

## 2020-10-09 DIAGNOSIS — L97422 Non-pressure chronic ulcer of left heel and midfoot with fat layer exposed: Secondary | ICD-10-CM | POA: Diagnosis not present

## 2020-10-09 DIAGNOSIS — G4733 Obstructive sleep apnea (adult) (pediatric): Secondary | ICD-10-CM | POA: Diagnosis not present

## 2020-10-09 NOTE — Telephone Encounter (Signed)
Called and scheduled Patient for in person appointment 10/09/20 at 3:00 pm.

## 2020-10-09 NOTE — Telephone Encounter (Signed)
[facility] or here for visit if available sch appt with me or another provider or [place]

## 2020-10-09 NOTE — Telephone Encounter (Signed)
Normajean Baxter, RN, called in from [place] about pt. Pt c/o swelling on both legs x2weeks. Christy evaluated and states that the legs look like the beginning of Cellulitis. She also listened to her breathing and heard crackling in the left lung. Pt reports that the crackling has been there for 1 week. Alyse Low wanted to inform the provider and get instructions. She wants a call back to her cell at [phone]

## 2020-10-10 ENCOUNTER — Ambulatory Visit: Payer: Medicare HMO | Admitting: Internal Medicine

## 2020-10-12 DIAGNOSIS — R635 Abnormal weight gain: Secondary | ICD-10-CM | POA: Diagnosis not present

## 2020-10-12 DIAGNOSIS — R0601 Orthopnea: Secondary | ICD-10-CM | POA: Diagnosis not present

## 2020-10-12 DIAGNOSIS — R600 Localized edema: Secondary | ICD-10-CM | POA: Diagnosis not present

## 2020-10-12 DIAGNOSIS — R059 Cough, unspecified: Secondary | ICD-10-CM | POA: Diagnosis not present

## 2020-10-14 IMAGING — DX LEFT FOOT - COMPLETE 3+ VIEW
3 series · 3 of 3 positions shown · non-contrast
Comparison: Radiographs February 16, 2012.

CLINICAL DATA: Left foot ulcer.

EXAM:
LEFT FOOT - COMPLETE 3+ VIEW

[foot ap]
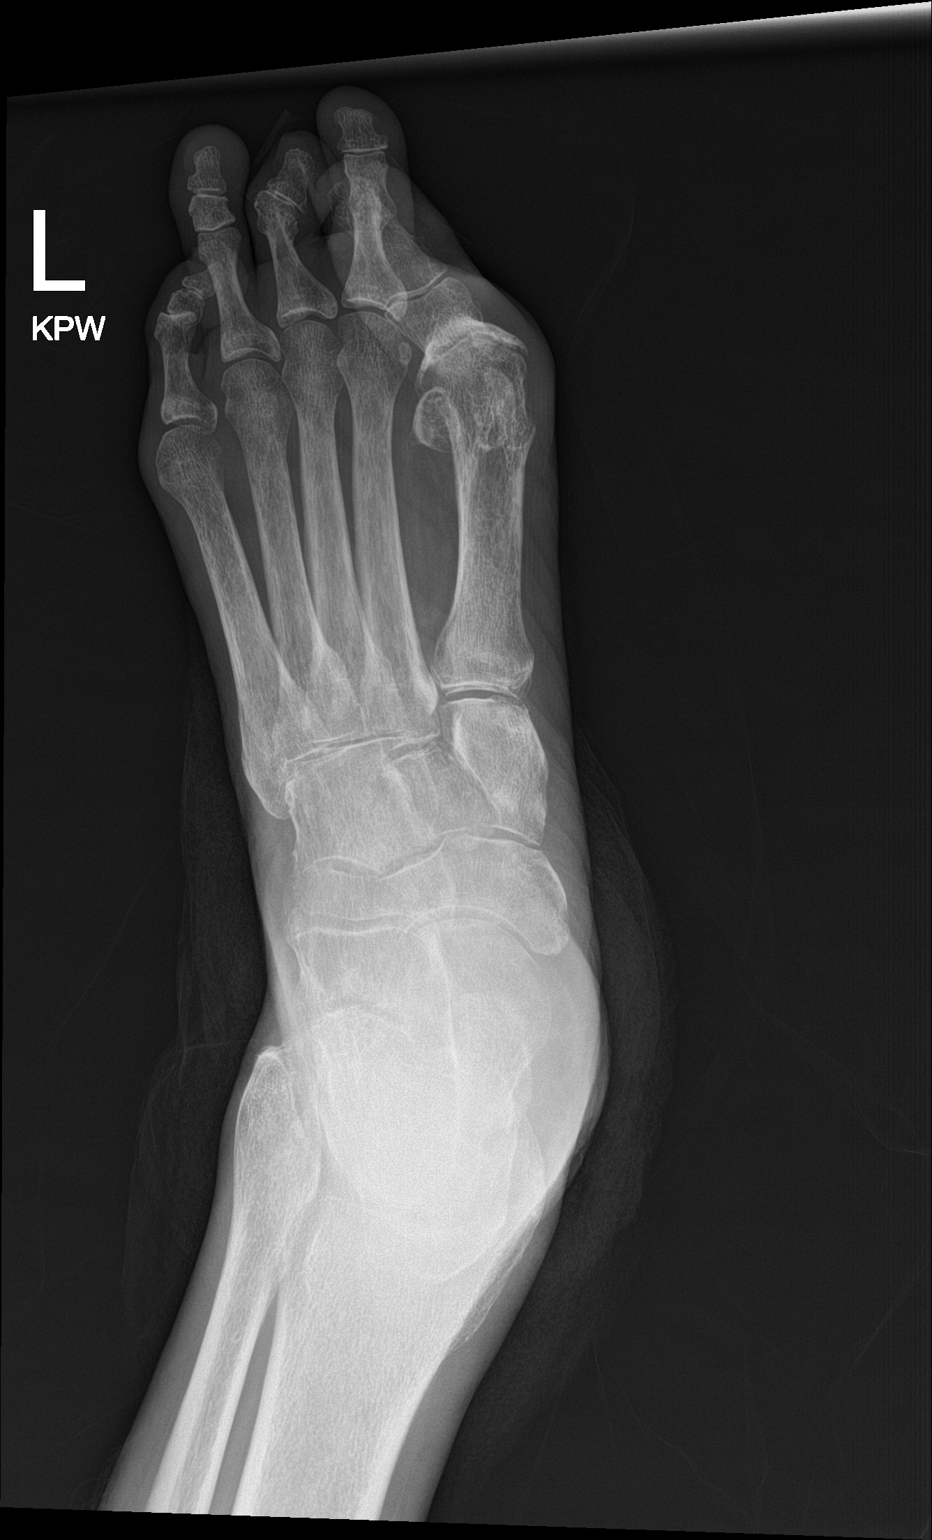

[foot obl]
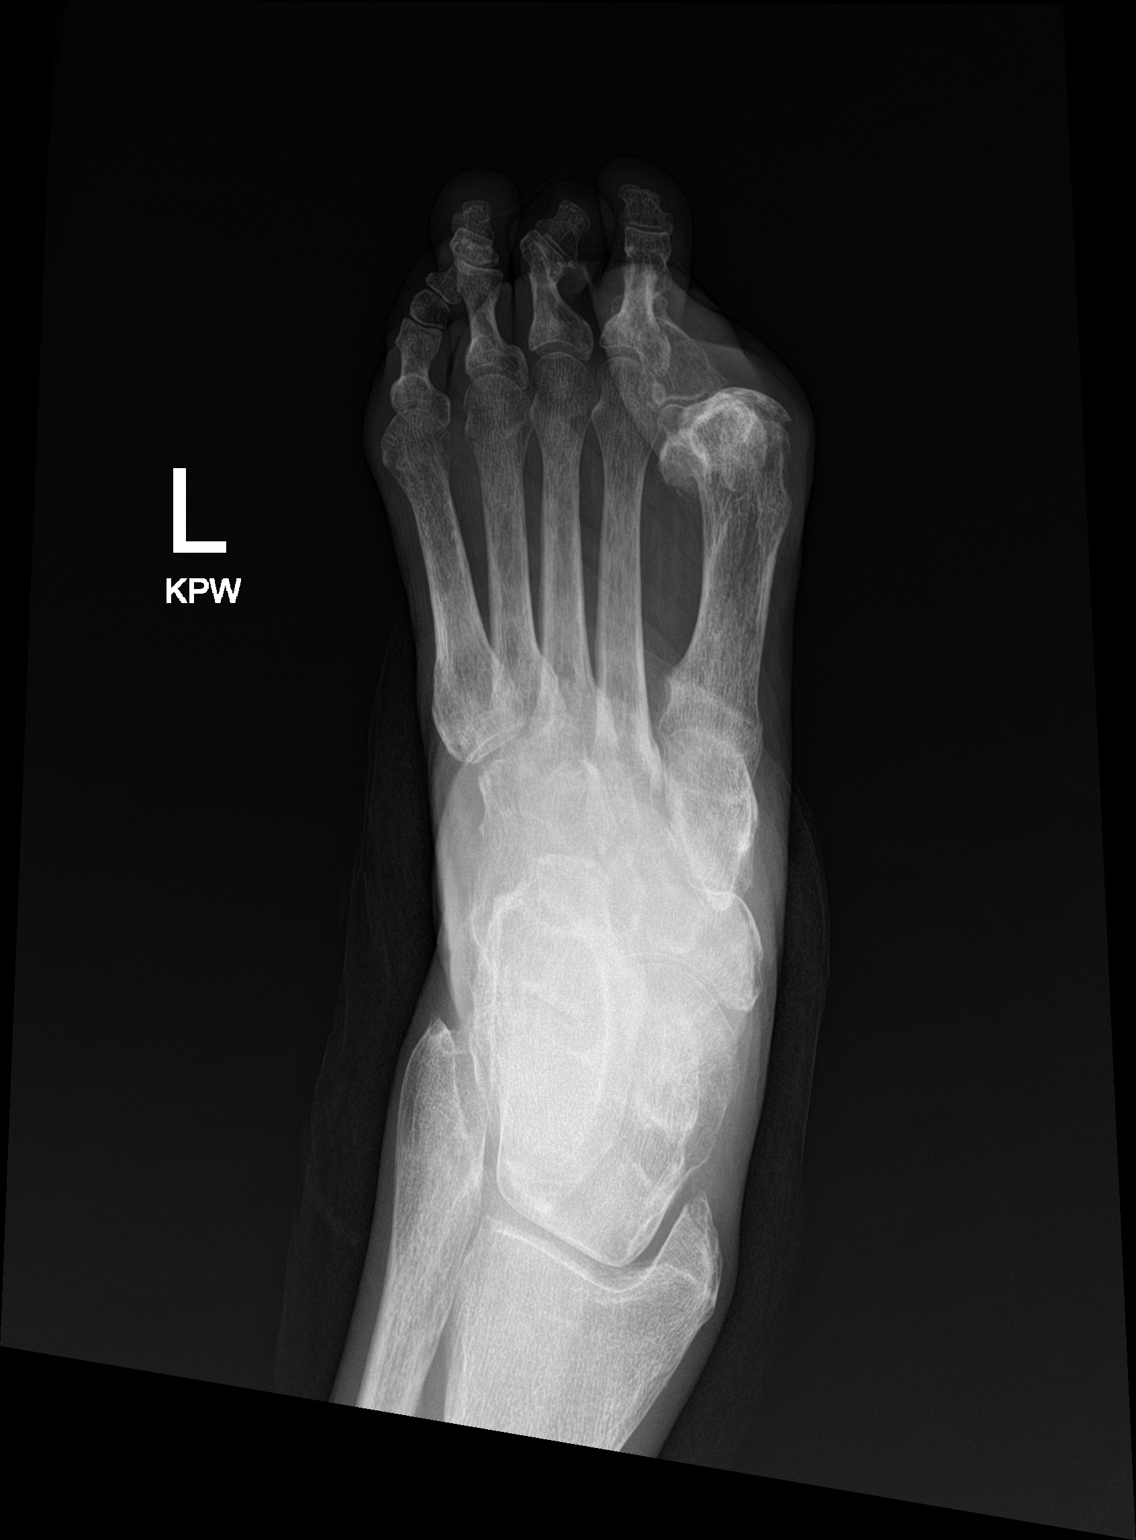

[foot lat]
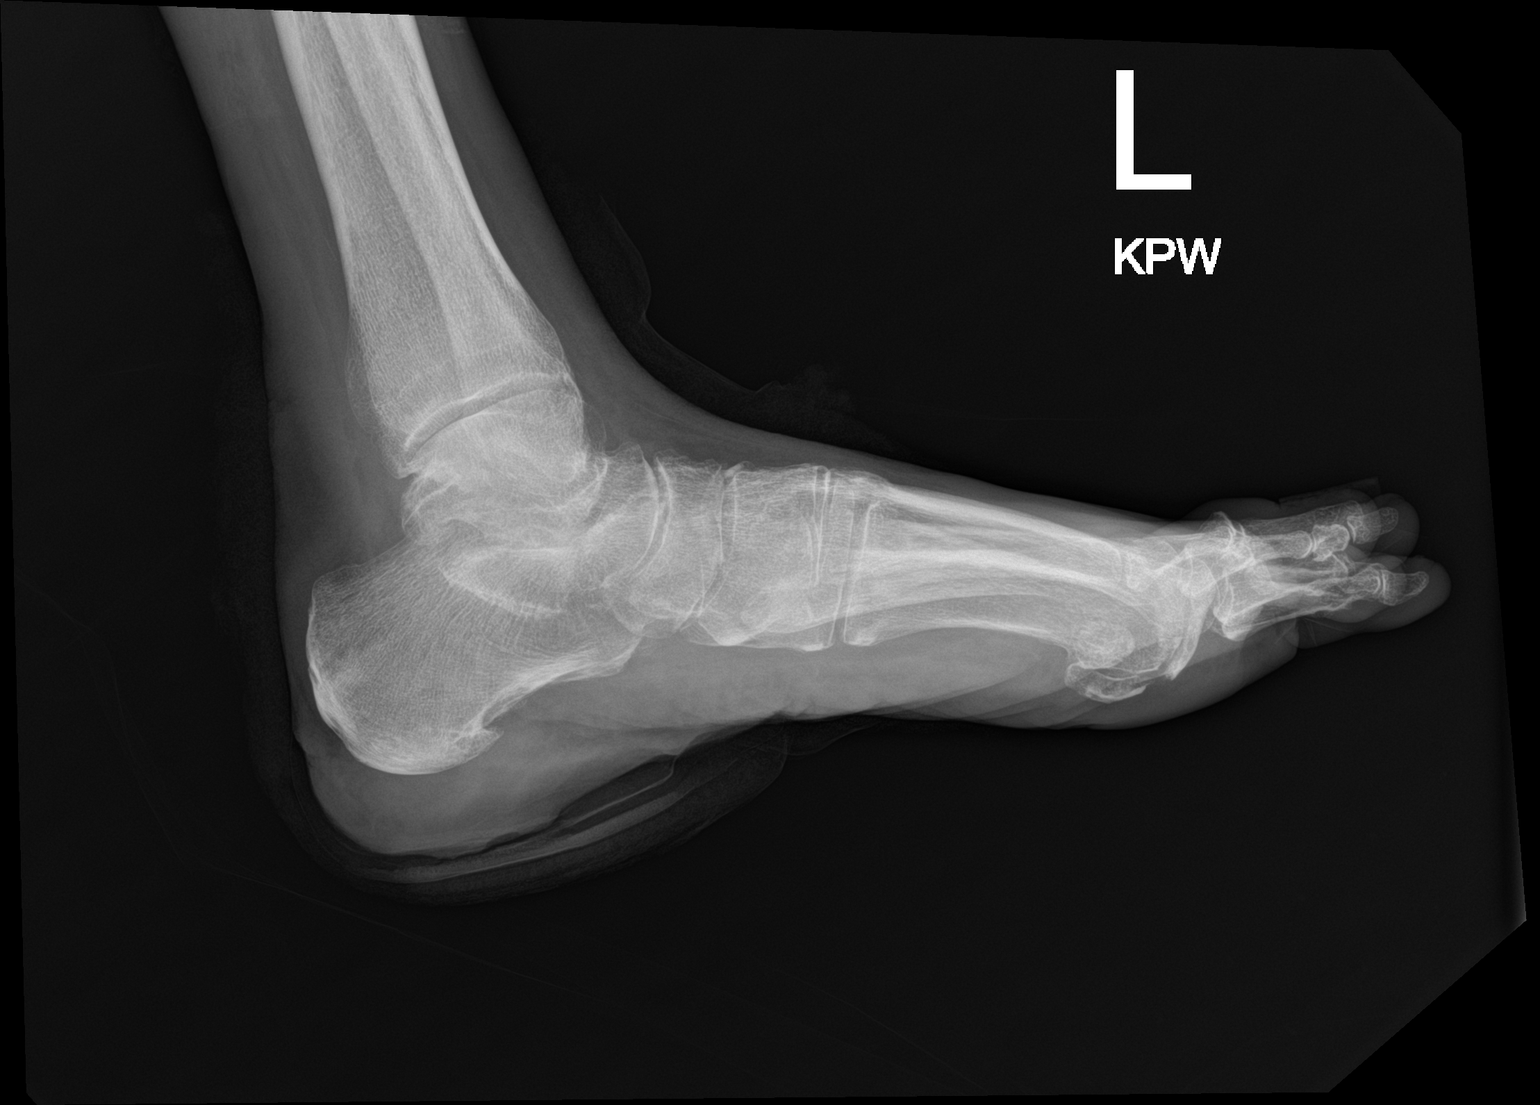

[3 of 3 positions shown; findings below may reference images not displayed]

FINDINGS: There is no evidence of fracture or dislocation. Severe degenerative
changes seen involving the first metatarsophalangeal joint. Soft
tissues are unremarkable.
IMPRESSION: Severe osteoarthritis of first metatarsophalangeal joint. No acute
abnormality is noted.

## 2020-10-14 IMAGING — DX CHEST  1 VIEW
1 series · 1 of 1 positions shown · non-contrast
Comparison: Radiograph April 11, 2017.

CLINICAL DATA: Weakness, shortness of breath.

EXAM:
CHEST  1 VIEW

[chest ap]
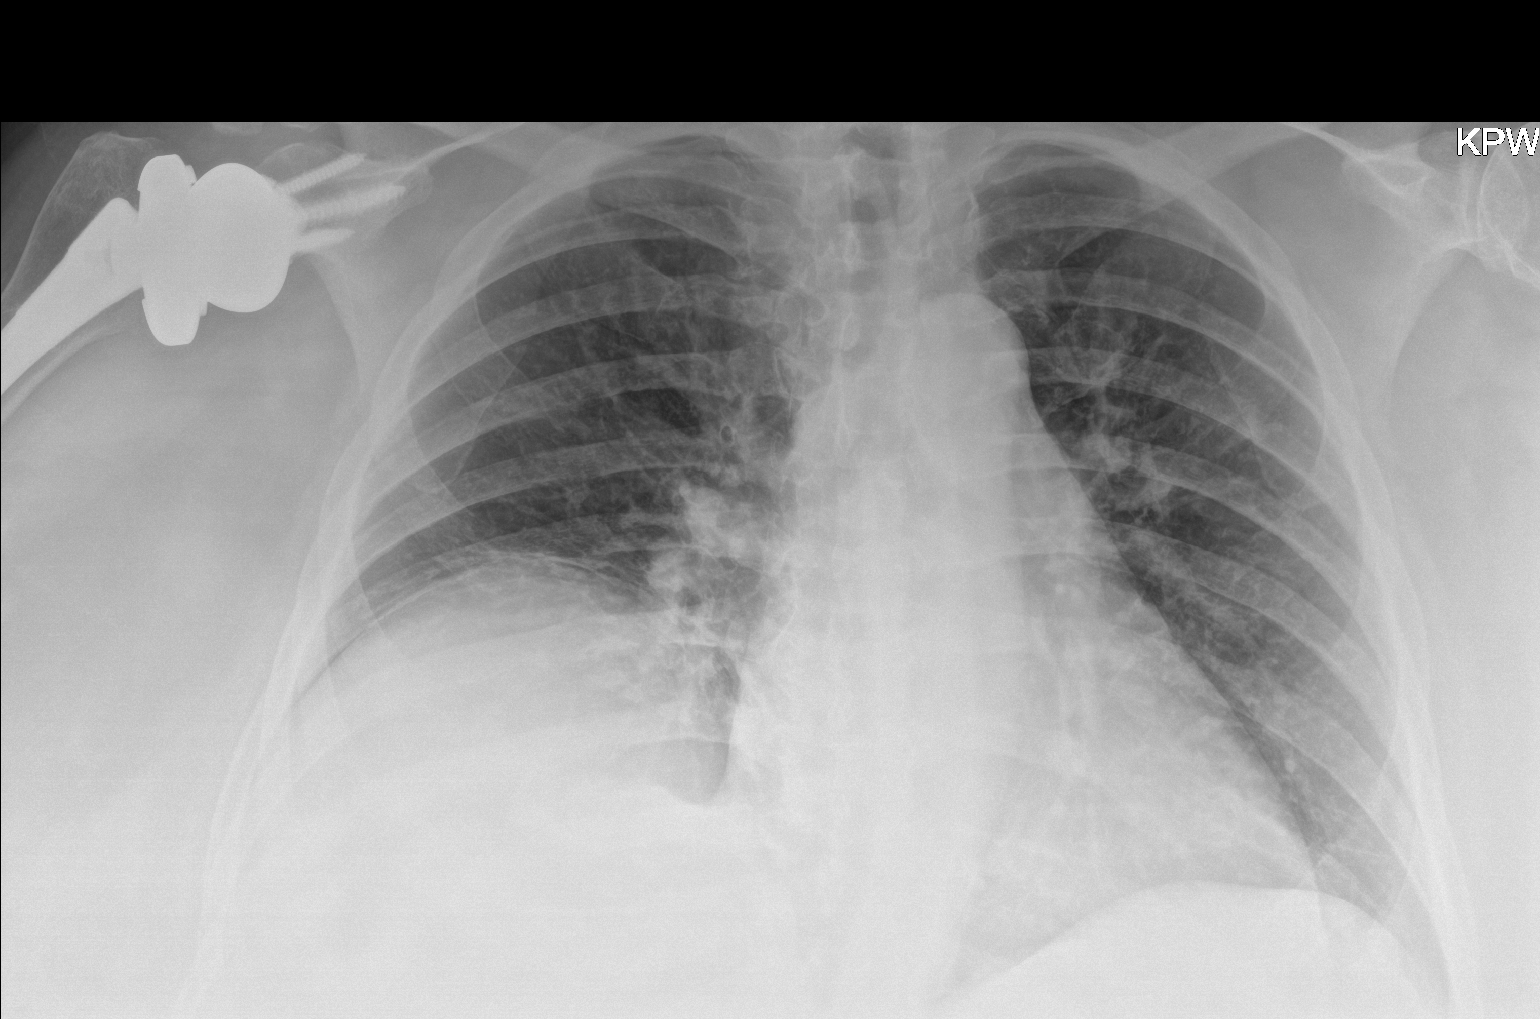

[1 of 1 positions shown; findings below may reference images not displayed]

FINDINGS: The heart size and mediastinal contours are within normal limits. No
pneumothorax or pleural effusion is noted. Left lung is clear.
Stable elevated right hemidiaphragm is noted with associated
subsegmental atelectasis. Status post right shoulder arthroplasty.
IMPRESSION: Stable elevated right hemidiaphragm with associated right basilar
subsegmental atelectasis.

## 2020-10-14 IMAGING — CT CT ANGIOGRAPHY CHEST
2 of 6 series · 18 of 46 positions shown · IV contrast (APPLIED)
Comparison: Chest x-ray today.  Chest CT 09/20/2016

CLINICAL DATA: Positive D-dimer, PE suspected

EXAM:
CT ANGIOGRAPHY CHEST WITH CONTRAST
TECHNIQUE: Multidetector CT imaging of the chest was performed using the
standard protocol during bolus administration of intravenous
contrast. Multiplanar CT image reconstructions and MIPs were
obtained to evaluate the vascular anatomy.
CONTRAST:  60mL OMNIPAQUE IOHEXOL 350 MG/ML SOLN

[Series 5: thins · axial · 0.72mm/px · z∈[-9,+243]mm · 16 of 277 slices shown]
[im 13/277  lung]
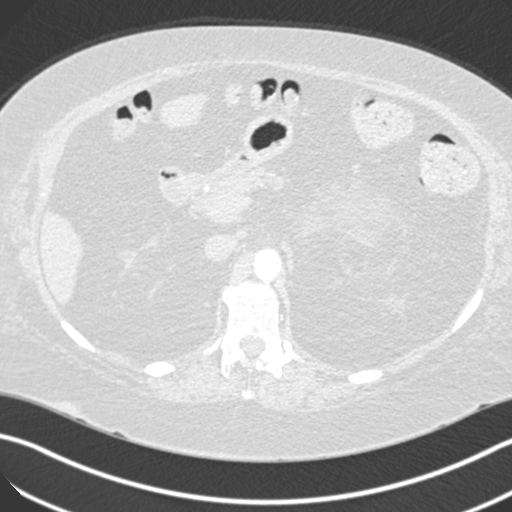
[im 37/277  soft-tissue]
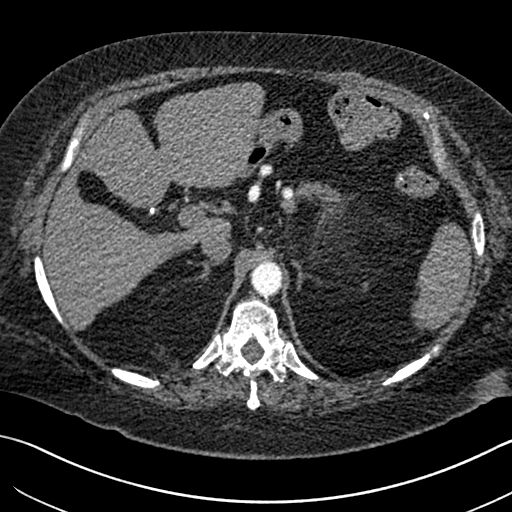
[im 49/277  lung]
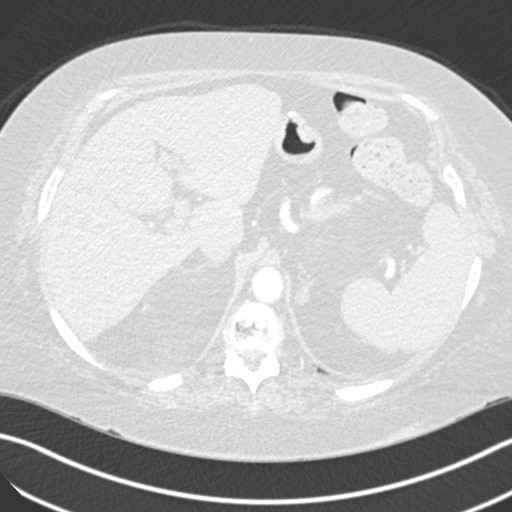
[im 61/277  soft-tissue]
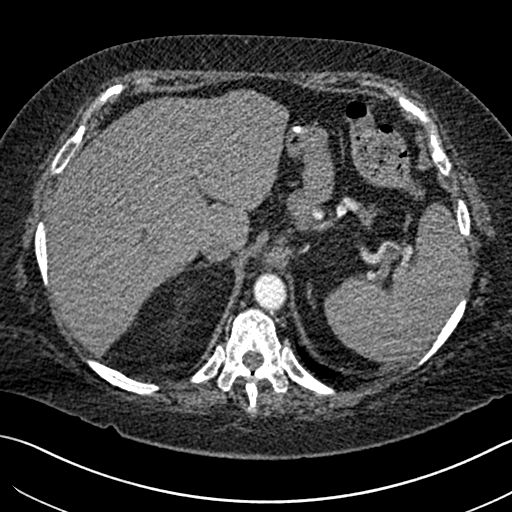
[im 85/277  lung]
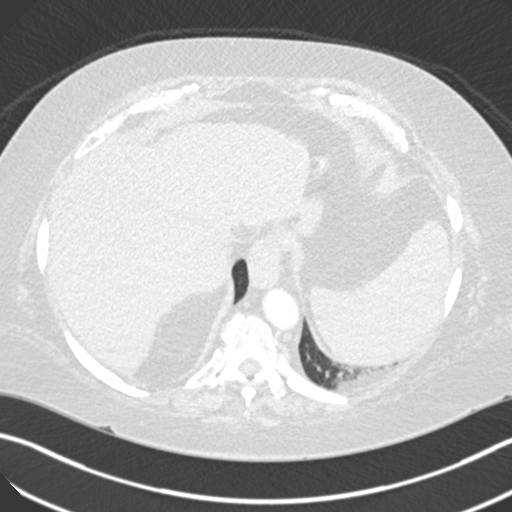
[im 97/277  soft-tissue]
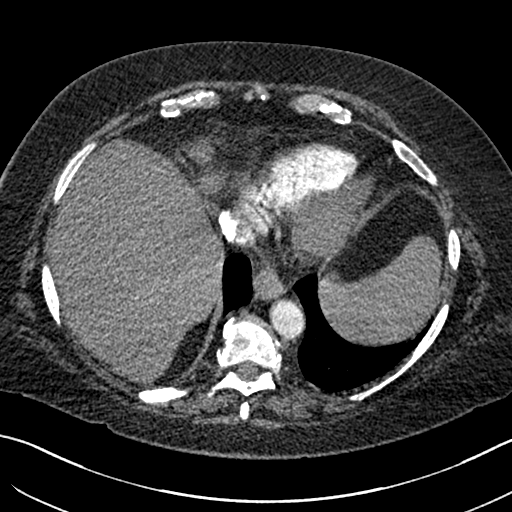
[im 109/277  lung]
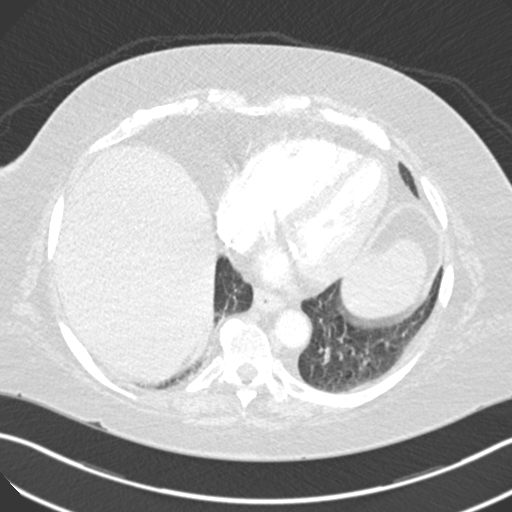
[im 133/277  soft-tissue]
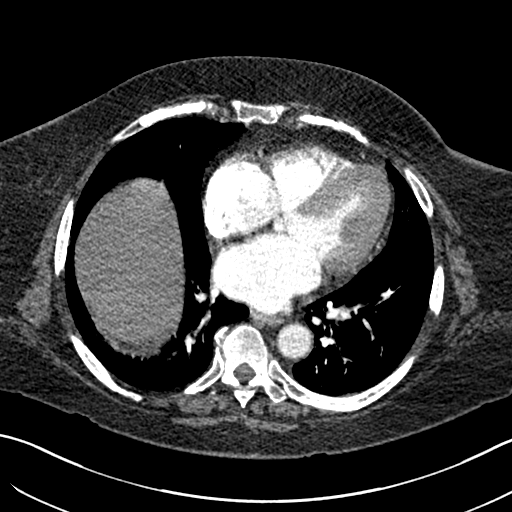
[im 145/277  lung]
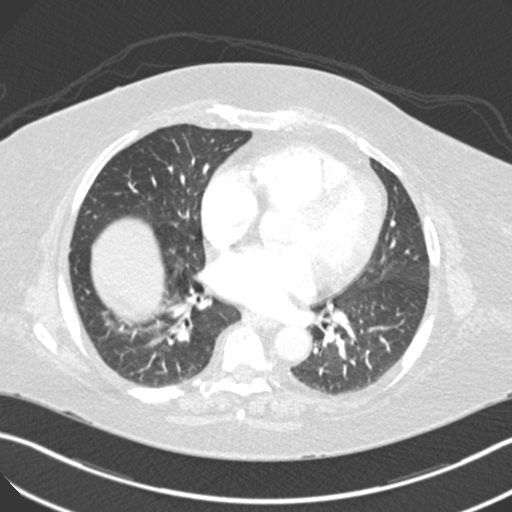
[im 169/277  soft-tissue]
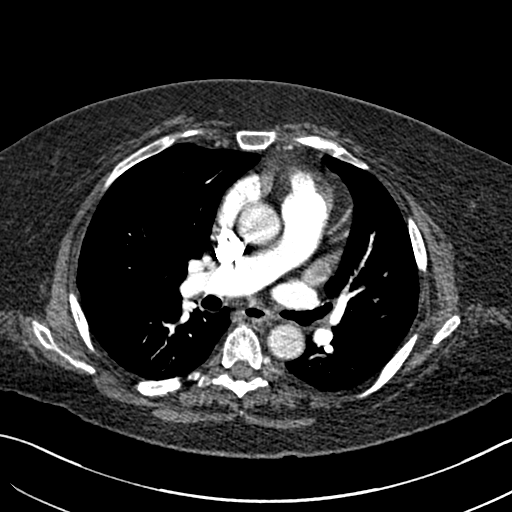
[im 181/277  lung]
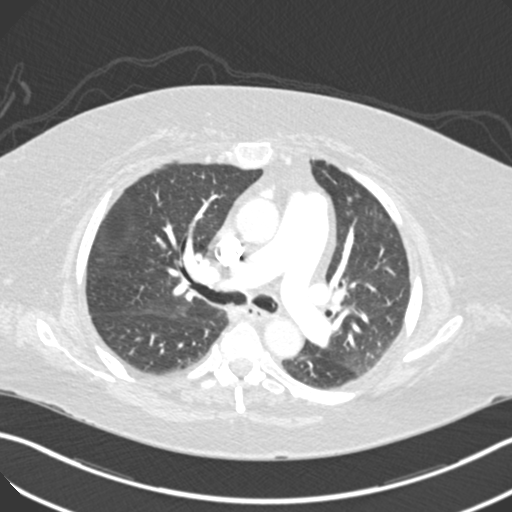
[im 193/277  soft-tissue]
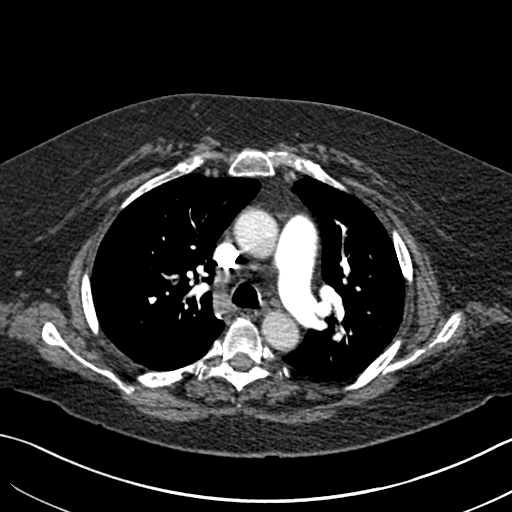
[im 217/277  lung]
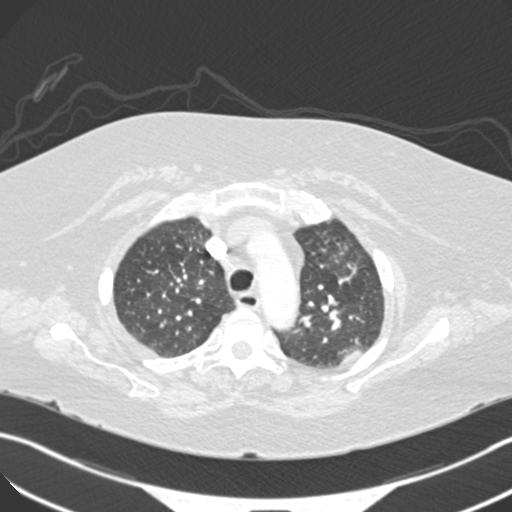
[im 229/277  soft-tissue]
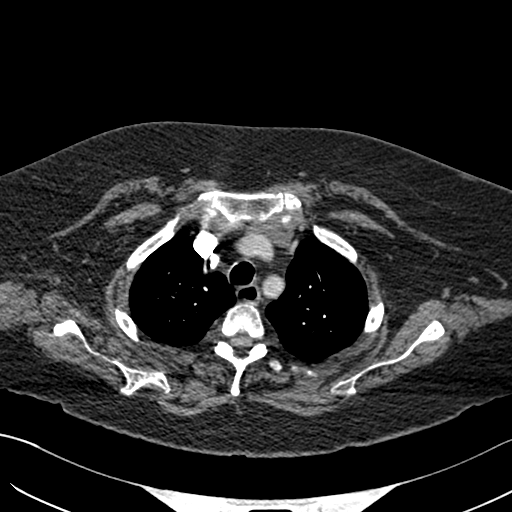
[im 241/277  lung]
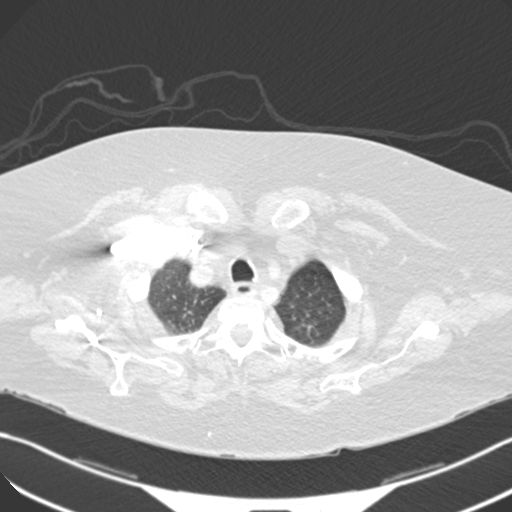
[im 265/277  soft-tissue]
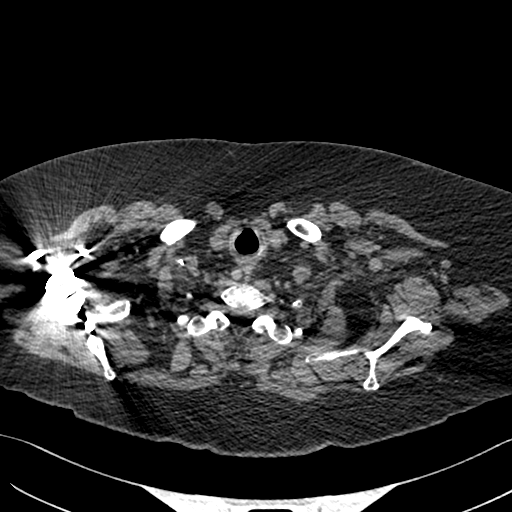

[Series 7: coronal mpr · coronal · 0.59mm/px · 2 of 98 slices shown]
[im 33/98  soft-tissue]
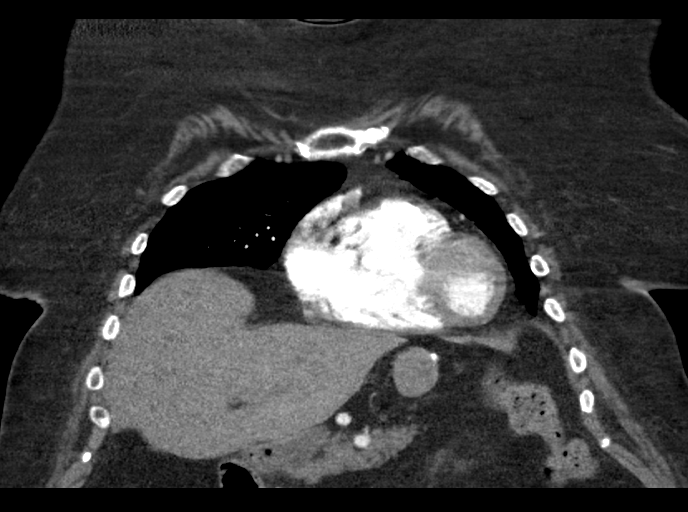
[im 65/98  soft-tissue]
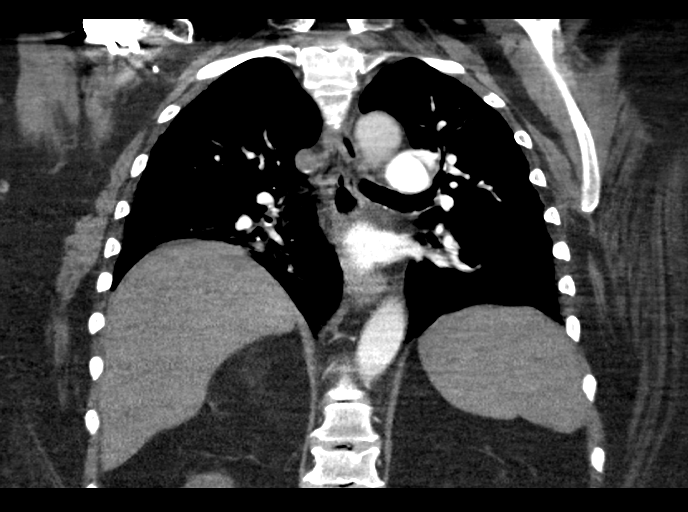

[18 of 46 positions shown; findings below may reference images not displayed]

FINDINGS: Cardiovascular: Heart is normal size. Aorta is normal caliber.
Scattered coronary artery calcifications in the left anterior
descending coronary artery. Scattered aortic calcifications. No
filling defects in the pulmonary arteries to suggest pulmonary
emboli.

Mediastinum/Nodes: Thyroid unremarkable. No mediastinal, hilar, or
axillary adenopathy. Trachea and esophagus are unremarkable.

Lungs/Pleura: Tree-in-bud gland gas nodular densities in the
anterior right upper lobe are stable since 2503, likely
postinflammatory scarring. No acute confluent airspace opacities or
effusions.

Upper Abdomen: Imaging into the upper abdomen shows no acute
findings. Postoperative changes within the stomach, likely related
to gastric sleeve.

Musculoskeletal: Chest wall soft tissues are unremarkable. No acute
bony abnormality.

Review of the MIP images confirms the above findings.
IMPRESSION: No evidence of pulmonary embolus.

Punctate scattered left anterior descending coronary calcifications.

Aortic Atherosclerosis ([id number]).

## 2020-10-14 IMAGING — CT CT HEAD WITHOUT CONTRAST
3 series · 15 of 47 positions shown, 18 images · non-contrast
Comparison: 12/24/2004

CLINICAL DATA: Muscle weakness, bilateral lower extremity pain

EXAM:
CT HEAD WITHOUT CONTRAST
TECHNIQUE: Contiguous axial images were obtained from the base of the skull
through the vertex without intravenous contrast.

[Series 3: head wo · axial · 0.42mm/px · z∈[+373,+498]mm · 9 of 30 slices shown, 12 images]
[im 3/30  brain]
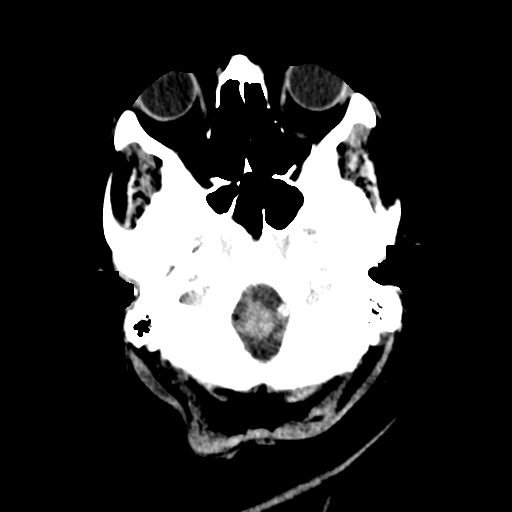
[im 3/30  bone]
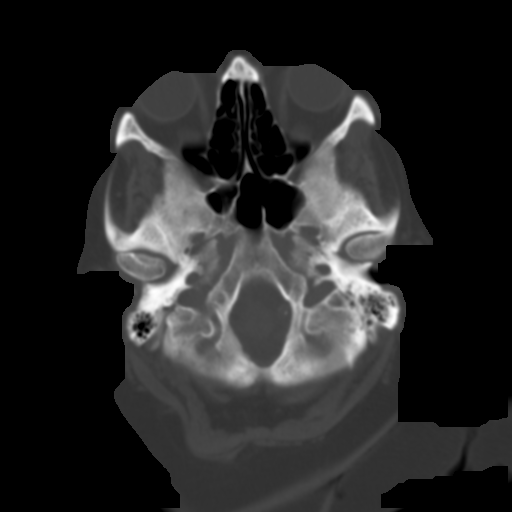
[im 6/30  brain]
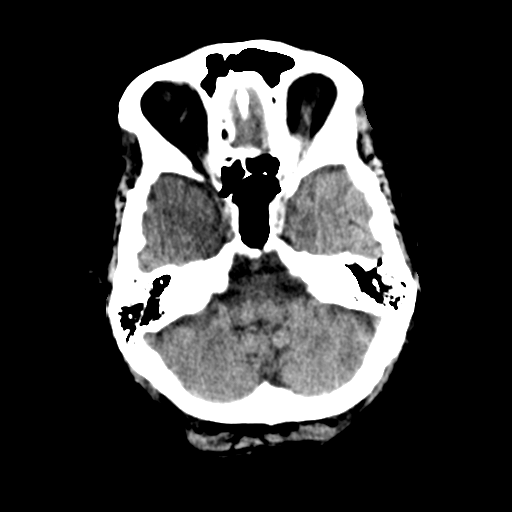
[im 9/30  brain]
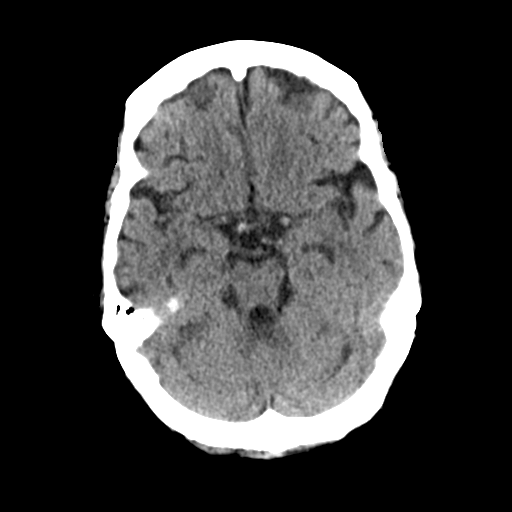
[im 12/30  brain]
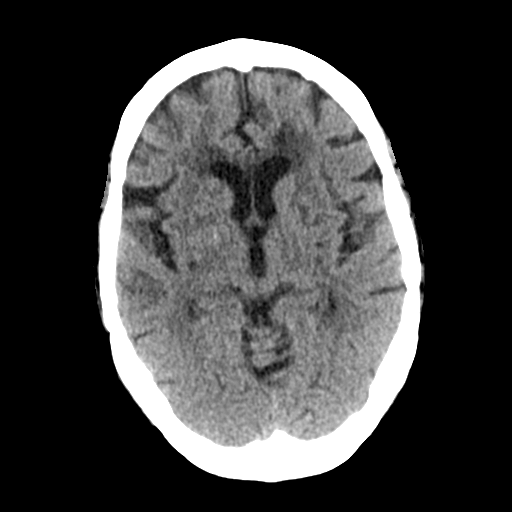
[im 16/30  brain]
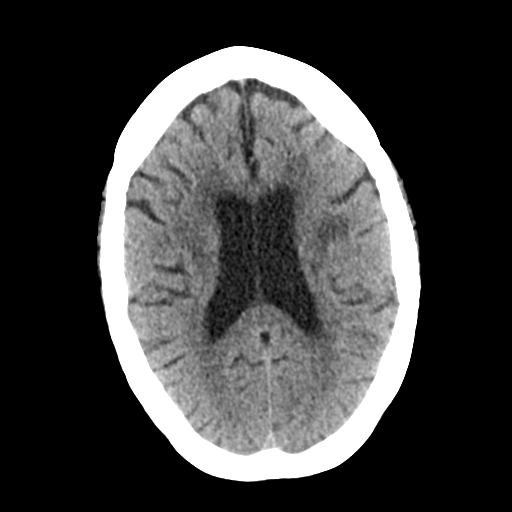
[im 16/30  bone]
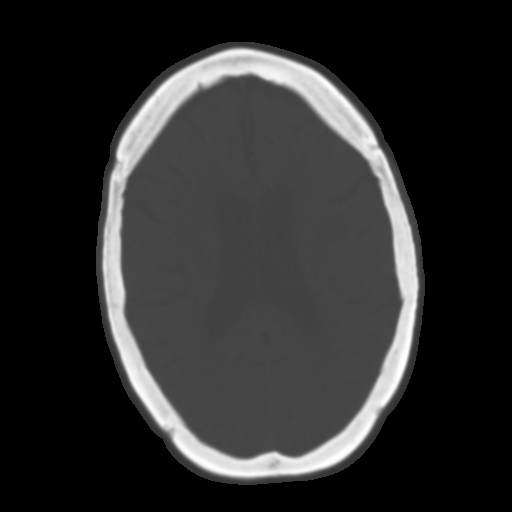
[im 19/30  brain]
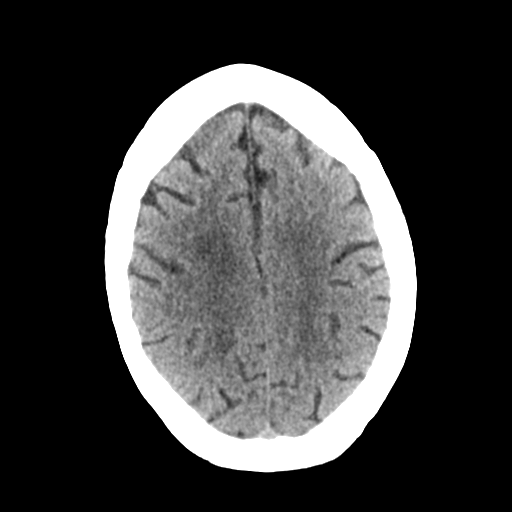
[im 22/30  brain]
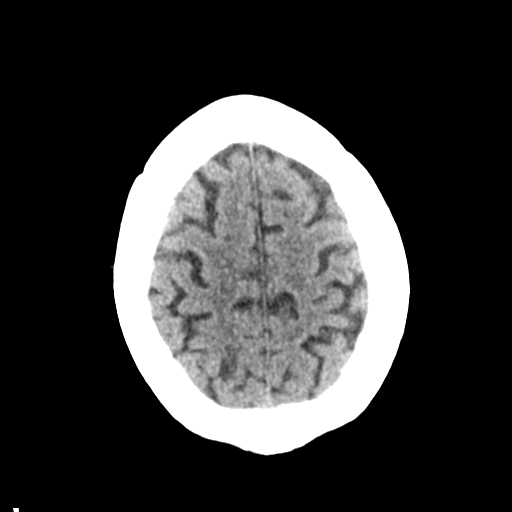
[im 25/30  brain]
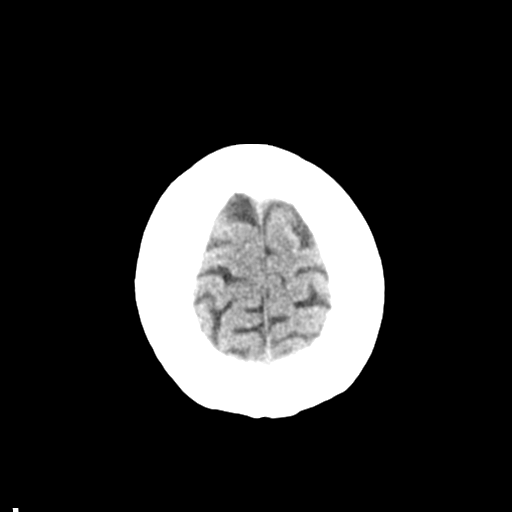
[im 28/30  brain]
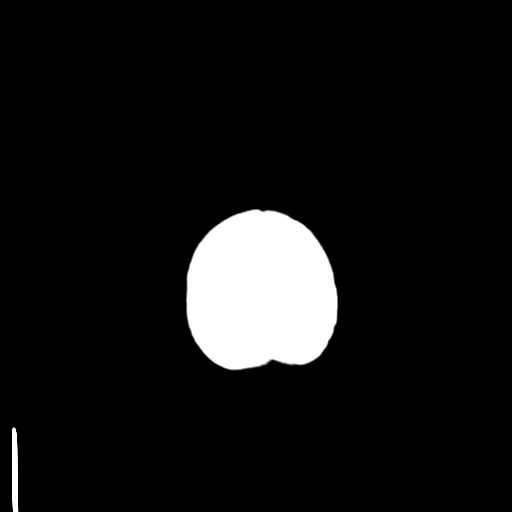
[im 28/30  bone]
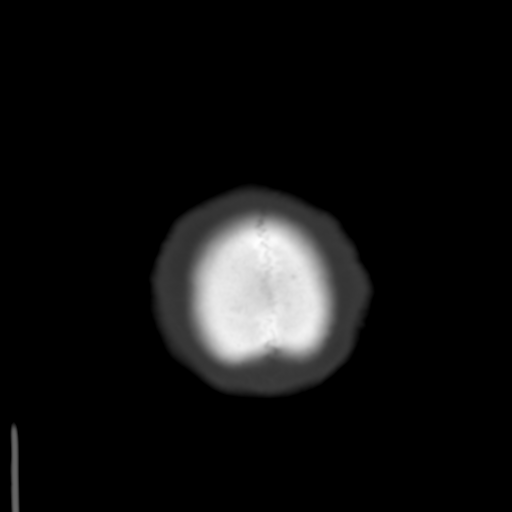

[Series 4: coronal soft tissue · coronal · 0.33mm/px · 3 of 67 slices shown]
[im 23/67  brain]
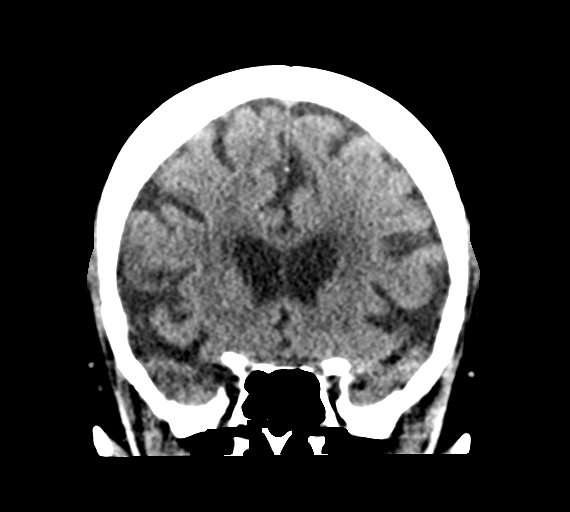
[im 30/67  brain]
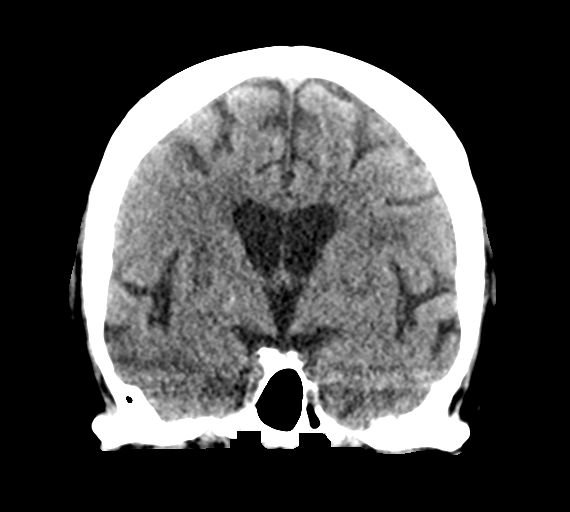
[im 37/67  brain]
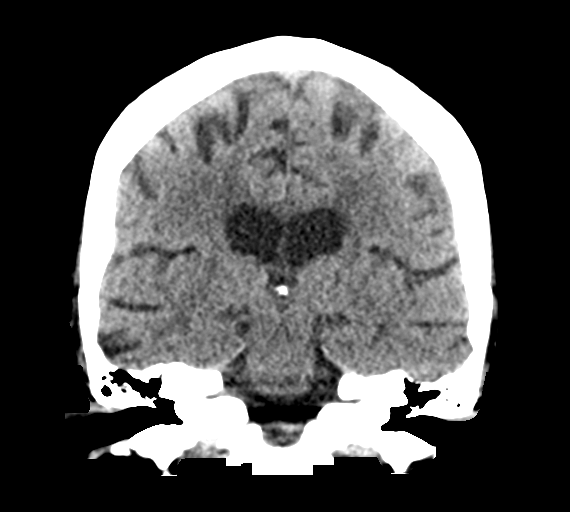

[Series 5: sagittal soft tissue · sagittal · 0.34mm/px · 3 of 48 slices shown]
[im 16/48  brain]
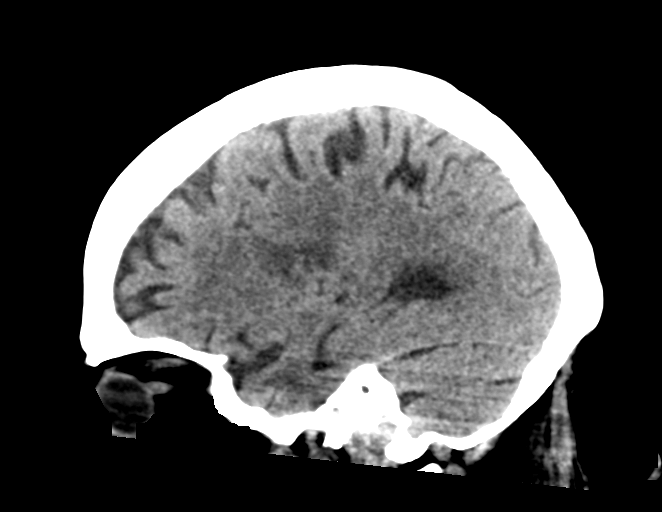
[im 24/48  brain]
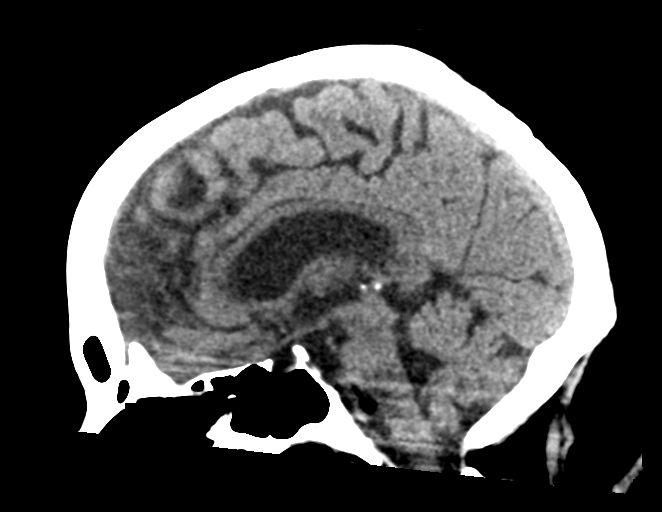
[im 32/48  brain]
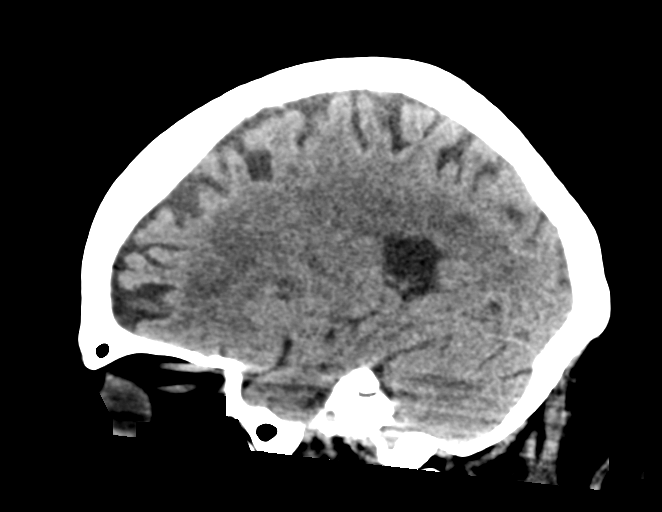

[15 of 47 positions shown; findings below may reference images not displayed]

FINDINGS: Brain: No evidence of acute infarction, hemorrhage, extra-axial
collection, ventriculomegaly, or mass effect. Generalized cerebral
atrophy. Periventricular white matter low attenuation likely
secondary to microangiopathy.

Vascular: Cerebrovascular atherosclerotic calcifications are noted.

Skull: Negative for fracture or focal lesion.

Sinuses/Orbits: Visualized portions of the orbits are unremarkable.
Visualized portions of the paranasal sinuses and mastoid air cells
are unremarkable.

Other: None.
IMPRESSION: No acute intracranial pathology.

## 2020-10-15 ENCOUNTER — Ambulatory Visit (INDEPENDENT_AMBULATORY_CARE_PROVIDER_SITE_OTHER): Payer: Medicare HMO | Admitting: Internal Medicine

## 2020-10-15 ENCOUNTER — Encounter: Payer: Self-pay | Admitting: Internal Medicine

## 2020-10-15 ENCOUNTER — Other Ambulatory Visit: Payer: Self-pay

## 2020-10-15 VITALS — BP 150/86 | HR 56 | Temp 97.8°F | Ht 65.5 in | Wt 262.0 lb

## 2020-10-15 DIAGNOSIS — B373 Candidiasis of vulva and vagina: Secondary | ICD-10-CM | POA: Diagnosis not present

## 2020-10-15 DIAGNOSIS — E039 Hypothyroidism, unspecified: Secondary | ICD-10-CM

## 2020-10-15 DIAGNOSIS — E875 Hyperkalemia: Secondary | ICD-10-CM | POA: Diagnosis not present

## 2020-10-15 DIAGNOSIS — T148XXA Other injury of unspecified body region, initial encounter: Secondary | ICD-10-CM

## 2020-10-15 DIAGNOSIS — M25562 Pain in left knee: Secondary | ICD-10-CM | POA: Diagnosis not present

## 2020-10-15 DIAGNOSIS — M25462 Effusion, left knee: Secondary | ICD-10-CM | POA: Diagnosis not present

## 2020-10-15 DIAGNOSIS — B3731 Acute candidiasis of vulva and vagina: Secondary | ICD-10-CM

## 2020-10-15 DIAGNOSIS — J9801 Acute bronchospasm: Secondary | ICD-10-CM

## 2020-10-15 LAB — BASIC METABOLIC PANEL
BUN: 22 mg/dL (ref 6–23)
CO2: 29 mEq/L (ref 19–32)
Calcium: 8.4 mg/dL (ref 8.4–10.5)
Chloride: 102 mEq/L (ref 96–112)
Creatinine, Ser: 1.19 mg/dL (ref 0.40–1.20)
GFR: 46.13 mL/min — ABNORMAL LOW (ref >60.00)
Glucose, Bld: 144 mg/dL — ABNORMAL HIGH (ref 70–99)
Potassium: 4.8 mEq/L (ref 3.5–5.1)
Sodium: 138 mEq/L (ref 135–145)

## 2020-10-15 LAB — TSH: TSH: 0.42 u[IU]/mL (ref 0.35–5.50)

## 2020-10-15 MED ORDER — DOXYCYCLINE HYCLATE 100 MG PO TABS: 100.0000 mg | ORAL_TABLET | Freq: Two times a day (BID) | ORAL | 0 refills | Status: DC

## 2020-10-15 MED ORDER — MUPIROCIN 2 % EX OINT: 1.0000 "application " | TOPICAL_OINTMENT | Freq: Two times a day (BID) | CUTANEOUS | 2 refills | Status: DC

## 2020-10-15 MED ORDER — FLUCONAZOLE 150 MG PO TABS: 150.0000 mg | ORAL_TABLET | Freq: Once | ORAL | 0 refills | Status: AC

## 2020-10-15 MED ORDER — COLLAGENASE 250 UNIT/GM EX OINT: 1.0000 "application " | TOPICAL_OINTMENT | Freq: Every day | CUTANEOUS | 0 refills | Status: DC

## 2020-10-15 NOTE — Progress Notes (Signed)
Chief Complaint  Patient presents with   Breathing Problem   Cellulitis   F/u with husband  1. Labs 08/22/20 tsh low on levo 175 mcg qd then 1/2 pill on sundays repeat today and labs K elevated 5.3 on 08/22/20  2. Lungs crackling osunds 3. Right lower leg x 1 week had blisters and redness which improved pt popped blisters and now red and draining 1.5-2 cm x 2 wounds tried vasoline like yellow gauze and bandaids not healing  4. C/o left knee swelling    Review of Systems  Constitutional:  Negative for weight loss.  HENT:  Negative for hearing loss.   Eyes:  Negative for blurred vision.  Cardiovascular:  Negative for chest pain.  Gastrointestinal:  Positive for constipation. Negative for abdominal pain.       Hemorrhoids   Musculoskeletal:  Negative for falls.  Skin:  Negative for rash.  Past Medical History:  Diagnosis Date   Abnormal antibody titer    Anxiety and depression    Arthritis    Asthma    Cystocele    Depression    Diabetes ([place])    DVT (deep venous thrombosis) (HCC)    Righ calf   Dyspnea    11/08/2018 - "more now due to lack of exercise"   Dysrhythmia    afib   Edema    GERD (gastroesophageal reflux disease)    Heart murmur    Echocardiogram June 2015: Mild MR (possible vegetation seen on TTE, not seen on TEE), normal LV size with moderate concentric LVH. Normal function EF 60-65%. Normal diastolic function. Mild LA dilation.   History of IBS    History of kidney stones    History of methicillin resistant staphylococcus aureus (MRSA) 2008   Hyperlipidemia    Hypertension    Hypothyroidism    Insomnia    Kidney stone    kidney stones with lithotripsy .  [facility]- "adrenal glands"   Neuropathy involving both lower extremities    Obesity, Class II, BMI 35-39.9, with comorbidity    Paroxysmal atrial fibrillation ([place]) 2007   In June 2015, Cardiac Event Monitor: Mostly SR/sinus arrhythmia with PVCs that are frequent. Short bursts of A. fib lasting  several minutes;; CHA2DS2-VASc Score = 5 (age, Female, PVD, DM, HTN)   Peripheral vascular disease ([place])    Sleep apnea    use C-PAP   Urinary incontinence    Venous stasis dermatitis of both lower extremities    Past Surgical History:  Procedure Laterality Date   ANKLE SURGERY Right    APPLICATION OF WOUND VAC Left 06/27/2015   Procedure: APPLICATION OF WOUND VAC ( POSSIBLE ) ;  Surgeon: [name], MD;  Location: [facility] ORS;  Service: Vascular;  Laterality: Left;   APPLICATION OF WOUND VAC Left 09/25/2016   Procedure: APPLICATION OF WOUND VAC;  Surgeon: [name], DPM;  Location: [facility] ORS;  Service: Podiatry;  Laterality: Left;   arm surgery     right     CHOLECYSTECTOMY     COLONOSCOPY     COLONOSCOPY WITH PROPOFOL N/A 01/11/2017   Procedure: COLONOSCOPY WITH PROPOFOL;  Surgeon: [name], MD;  Location: [facility] ENDOSCOPY;  Service: Endoscopy;  Laterality: N/A;   COLONOSCOPY WITH PROPOFOL N/A 02/22/2017   Procedure: COLONOSCOPY WITH PROPOFOL;  Surgeon: [name], MD;  Location: [facility] ENDOSCOPY;  Service: Endoscopy;  Laterality: N/A;   COLONOSCOPY WITH PROPOFOL N/A 05/31/2017   Procedure: COLONOSCOPY WITH PROPOFOL;  Surgeon: [name],  MD;  Location: [facility] ENDOSCOPY;  Service: Endoscopy;  Laterality: N/A;   ESOPHAGOGASTRODUODENOSCOPY (EGD) WITH PROPOFOL N/A 01/11/2017   Procedure: ESOPHAGOGASTRODUODENOSCOPY (EGD) WITH PROPOFOL;  Surgeon: [name], MD;  Location: [facility] ENDOSCOPY;  Service: Endoscopy;  Laterality: N/A;   ESOPHAGOGASTRODUODENOSCOPY (EGD) WITH PROPOFOL N/A 10/25/2018   Procedure: ESOPHAGOGASTRODUODENOSCOPY (EGD) WITH PROPOFOL;  Surgeon: [name], MD;  Location: [facility] ENDOSCOPY;  Service: Endoscopy;  Laterality: N/A;   ESOPHAGOGASTRODUODENOSCOPY (EGD) WITH PROPOFOL N/A 11/29/2019   Procedure: ESOPHAGOGASTRODUODENOSCOPY (EGD) WITH PROPOFOL;  Surgeon: Toledo, Benay Pike, MD;  Location: [facility] ENDOSCOPY;  Service: Gastroenterology;  Laterality:  N/A;   HERNIA REPAIR     umbilical   HIATAL HERNIA REPAIR     I & D EXTREMITY Left 06/27/2015   Procedure: IRRIGATION AND DEBRIDEMENT EXTREMITY            ( CALF HEMATOMA ) POSSIBLE WOUND VAC;  Surgeon: [name], MD;  Location: [facility] ORS;  Service: Vascular;  Laterality: Left;   INCISION AND DRAINAGE OF WOUND Left 09/25/2016   Procedure: IRRIGATION AND DEBRIDEMENT - PARTIAL RESECTION OF ACHILLES TENDON WITH WOUND VAC APPLICATION;  Surgeon: [name], DPM;  Location: [facility] ORS;  Service: Podiatry;  Laterality: Left;   INCISION AND DRAINAGE OF WOUND Left 11/09/2018   Procedure: Excision of left foot wound with ACell placement;  Surgeon: Wallace Going, DO;  Location: [facility];  Service: Plastics;  Laterality: Left;   IRRIGATION AND DEBRIDEMENT ABSCESS Left 09/07/2016   Procedure: IRRIGATION AND DEBRIDEMENT ABSCESS LEFT HEEL;  Surgeon: [name], DPM;  Location: [facility] ORS;  Service: Podiatry;  Laterality: Left;   JOINT REPLACEMENT  2013   left knee replacement   KIDNEY SURGERY Right    kidney stones   LITHOTRIPSY     LOWER EXTREMITY ANGIOGRAPHY Left 04/04/2018   Procedure: LOWER EXTREMITY ANGIOGRAPHY;  Surgeon: [name], MD;  Location: [place] [facility] CV LAB;  Service: Cardiovascular;  Laterality: Left;   NM GATED MYOVIEW [facility] HX)  February 2017   Likely breast attenuation. LOW RISK study. Normal EF 55-60%.   OTHER SURGICAL HISTORY     08/2016 or 09/2016 surgery on achilles tenso h/o staph infection heal. Dr. [name]   removal of left hematoma Left    leg   REVERSE SHOULDER ARTHROPLASTY Right 10/08/2015   Procedure: REVERSE SHOULDER ARTHROPLASTY;  Surgeon: [name], MD;  Location: [facility] ORS;  Service: Orthopedics;  Laterality: Right;   SLEEVE GASTROPLASTY     TONSILLECTOMY     TOTAL KNEE ARTHROPLASTY Left    TOTAL SHOULDER REPLACEMENT Right    TRANSESOPHAGEAL ECHOCARDIOGRAM  08/08/2013   Mild LVH, EF 60-65%. Moderate LA dilation and mild RA dilation. Mild MR with no evidence  of stenosis and no evidence of endocarditis. A false chordae is noted.   TRANSTHORACIC ECHOCARDIOGRAM  08/03/2013   Mild-Moderate concentric LVH, EF 60-65%. Normal diastolic function. Mild LA dilation. Mild MR with possible vegetation  - not confirmed on TEE    ULNAR NERVE TRANSPOSITION Right 08/06/2016   Procedure: ULNAR NERVE DECOMPRESSION/TRANSPOSITION;  Surgeon: [name], MD;  Location: [facility] ORS;  Service: Orthopedics;  Laterality: Right;   UPPER GI ENDOSCOPY     VAGINAL HYSTERECTOMY     Family History  Problem Relation Age of Onset   Skin cancer Father    Diabetes Father    Hypertension Father    Peripheral vascular disease Father    Cancer Father    Cerebral aneurysm Father    Alcohol  abuse Father    Varicose Veins Mother    Kidney disease Mother    Arthritis Mother    Mental illness Sister    Cancer Maternal Aunt        breast   Breast cancer Maternal Aunt    Arthritis Maternal Grandmother    Hypertension Maternal Grandmother    Diabetes Maternal Grandmother    Arthritis Maternal Grandfather    Heart disease Maternal Grandfather    Hypertension Maternal Grandfather    Arthritis Paternal Grandmother    Hypertension Paternal Grandmother    Diabetes Paternal Grandmother    Arthritis Paternal Grandfather    Hypertension Paternal Grandfather    Bladder Cancer Neg Hx    Kidney cancer Neg Hx    Social History   Socioeconomic History   Marital status: Married    Spouse name: Not on file   Number of children: Not on file   Years of education: Not on file   Highest education level: Not on file  Occupational History   Not on file  Tobacco Use   Smoking status: Never   Smokeless tobacco: Never  Vaping Use   Vaping Use: Never used  Substance and Sexual Activity   Alcohol use: No    Alcohol/week: 0.0 standard drinks   Drug use: Never   Sexual activity: Yes  Other Topics Concern   Not on file  Social History Narrative   She is currently married -- for 30+  years. Does not work. Does not smoke or take alcohol. She never smoked. She exercises at least 3 days a week since before her gastric surgery.   Marital status reviewed in history of present illness.   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 Strain: Low Risk    Difficulty of Paying Living Expenses: Not very hard  Food Insecurity: No Food Insecurity   Worried About Charity fundraiser in the Last Year: Never true   Arboriculturist in the Last Year: Never true  Transportation Needs: Public librarian (Medical): Yes   Lack of Transportation (Non-Medical): Yes  Physical Activity: Not on file  Stress: Not on file  Social Connections: Not on file  Intimate Partner Violence: Not on file   Current Meds  Medication Sig   acetaminophen (TYLENOL) 325 MG tablet Take 2 tablets (650 mg total) by mouth every 6 (six) hours as needed for mild pain or moderate pain (or Fever >/= 101).   albuterol (PROVENTIL) (2.5 MG/3ML) 0.083% nebulizer solution Take 3 mLs (2.5 mg total) by nebulization every 4 (four) hours as needed for wheezing or shortness of breath.   ALPRAZolam (XANAX) 0.5 MG tablet TAKE 1/2 TABLET BY MOUTH TWICE DAILY AS NEEDED FOR ANXIETY   apixaban (ELIQUIS) 5 MG TABS tablet Take 1 tablet (5 mg total) by mouth 2 (two) times daily.   buPROPion (WELLBUTRIN XL) 150 MG 24 hr tablet Take 1 tablet (150 mg total) by mouth daily.   calcium-vitamin D (OSCAL WITH D) 500-200 MG-UNIT tablet Take 1 tablet by mouth 2 (two) times daily with a meal.   cholecalciferol (VITAMIN D) 25 MCG tablet Take 1 tablet (1,000 Units total) by mouth 2 (two) times daily with a meal.   clobetasol cream (TEMOVATE) [id number] % Apply 1 application topically 2 (two) times daily as needed (skin irritation).  colchicine 0.6 MG tablet Take 1 tablet (0.6 mg total) by mouth as needed. May repeat 1  pill in 1 hr. No more than 3 total pills 1.8 mg in 24 hours as needed gout flare   collagenase (SANTYL) ointment Apply 1 application topically at bedtime. X7-10 days   diclofenac Sodium (VOLTAREN) 1 % GEL Apply 2 g topically 3 (three) times daily.   doxycycline (VIBRA-TABS) 100 MG tablet Take 1 tablet (100 mg total) by mouth 2 (two) times daily. With food   DULoxetine (CYMBALTA) 60 MG capsule Take 1 capsule (60 mg total) by mouth daily.   ergocalciferol (VITAMIN D2) 1.25 MG (50000 UT) capsule Take 50,000 Units by mouth once a week.   estradiol (ESTRACE) 0.1 MG/GM vaginal cream Place 1 Applicatorful vaginally every Monday, Wednesday, and Friday. Apply 0.'5mg'$  (pea-sized amount)  just inside the vaginal introitus with a finger-tip on Monday, Wednesday and Friday nights,   fesoterodine (TOVIAZ) 8 MG TB24 tablet Take 1 tablet (8 mg total) by mouth daily.   fluconazole (DIFLUCAN) 150 MG tablet Take 1 tablet (150 mg total) by mouth once for 1 dose. Repeat in 3 days   fluticasone-salmeterol (ADVAIR HFA) 115-21 MCG/ACT inhaler Inhale 2 puffs into the lungs daily. Rinse mouth thoroughly after use   folic acid (FOLVITE) 1 MG tablet Take 1 tablet (1 mg total) by mouth daily.   HYDROcodone-acetaminophen (NORCO/VICODIN) 5-325 MG tablet Take 1 tablet by mouth every 8 (eight) hours as needed for severe pain. Must last 30 days   levocetirizine (XYZAL) 5 MG tablet Take 1 tablet (5 mg total) by mouth at bedtime as needed for allergies.   levothyroxine (SYNTHROID) 175 MCG tablet Take 1 tablet (175 mcg total) by mouth daily before breakfast. On SUNDAYS take 1/2 pill   metFORMIN (GLUCOPHAGE) 500 MG tablet Take 1 tablet (500 mg total) by mouth daily with breakfast. With food   metoprolol succinate (TOPROL XL) 25 MG 24 hr tablet Take 1 tablet (25 mg total) by mouth daily.   Misc. Devices (TUB TRANSFER BOARD) MISC 1 Device by Does not apply route as needed.   montelukast (SINGULAIR) 10 MG tablet Take 1 tablet (10 mg total)  by mouth at bedtime.   Multiple Vitamin (MULTIVITAMIN WITH MINERALS) TABS tablet Take 1 tablet by mouth daily.   mupirocin ointment (BACTROBAN) 2 % Apply 1 application topically 2 (two) times daily. Right lower leg   nitrofurantoin, macrocrystal-monohydrate, (MACROBID) 100 MG capsule Take 1 capsule (100 mg total) by mouth 2 (two) times daily. With food   ondansetron (ZOFRAN) 4 MG tablet Take 1 tablet (4 mg total) by mouth every 8 (eight) hours as needed for nausea or vomiting.   oxybutynin (DITROPAN XL) 15 MG 24 hr tablet Take 1 tablet (15 mg total) by mouth at bedtime.   pantoprazole (PROTONIX) 40 MG tablet Take 40 mg by mouth daily.    pregabalin (LYRICA) 50 MG capsule TAKE 1 CAPSULE (50 MG) BY MOUTH 3 TIMES DAILY   rosuvastatin (CRESTOR) 20 MG tablet TAKE 1 TABLET BY  MOUTH ONCE DAILY   sitaGLIPtin (JANUVIA) 25 MG tablet Take 1 tablet (25 mg total) by mouth daily.   sucralfate (CARAFATE) 1 GM/10ML suspension Take 1 g by mouth 3 (three) times daily.    sulfamethoxazole-trimethoprim (BACTRIM DS) 800-160 MG tablet Take 1 tablet by mouth 2 (two) times daily. With food   thiamine 100 MG tablet Take 0.5 tablets (50 mg total) by mouth 2 (two) times daily with a meal.   vitamin  B-12 1000 MCG tablet Take 1 tablet (1,000 mcg total) by mouth daily.   Allergies  Allergen Reactions   Morphine And Related Other (See Comments) and Nausea Only    Patient becomes very confused Other reaction(s): Other (See Comments), Other (See Comments) Patient becomes very confused Patient becomes very confused    Penicillins Hives, Shortness Of Breath, Swelling and Other (See Comments)    Facial swelling Has patient had a PCN reaction causing immediate rash, facial/tongue/throat swelling, SOB or lightheadedness with hypotension: Yes Has patient had a PCN reaction causing severe rash involving mucus membranes or skin necrosis: Yes Has patient had a PCN reaction that required hospitalization No Has patient had a PCN  reaction occurring within the last 10 years: No If all of the above answers are "NO", then may proceed with Cephalosporin use.  Other reaction(s): Other (See Comments), Other (See Comments) Facial swelling Has patient had a PCN reaction causing immediate rash, facial/tongue/throat swelling, SOB or lightheadedness with hypotension: Yes Has patient had a PCN reaction causing severe rash involving mucus membranes or skin necrosis: Yes Has patient had a PCN reaction that required hospitalization No Has patient had a PCN reaction occurring within the last 10 years: No If all of the above answers are "NO", then may proceed with Cephalosporin use. Facial swelling Has patient had a PCN reaction causing immediate rash, facial/tongue/throat swelling, SOB or lightheadedness with hypotension: Yes Has patient had a PCN reaction causing severe rash involving mucus membranes or skin necrosis: Yes Has patient had a PCN reaction that required hospitalization No Has patient had a PCN reaction occurring within the last 10 years: No If all of the above answers are "NO", then may proceed with Cephalosporin use. Facial swelling Has patient had a PCN reaction causing immediate rash, facial/tongue/throat swelling, SOB or lightheadedness with hypotension: Yes Has patient had a PCN reaction causing severe rash involving mucus membranes or skin necrosis: Yes Has patient had a PCN reaction that required hospitalization No Has patient had a PCN reaction occurring within the last 10 years: No If all of the above answers are "NO", then may proceed with Cephalosporin use.    Ambien [Zolpidem]     Hallucination    Aspirin Hives   Oxytetracycline Hives    Other reaction(s): Unknown    Recent Results (from the past 2160 hour(s))  Bladder Scan (Post Void Residual) in office     Status: None   Collection Time: 08/20/20  1:20 PM  Result Value Ref Range   Scan Result 0   Urine cytology ancillary only([place])      Status: None   Collection Time: 08/22/20  3:05 PM  Result Value Ref Range   Bacterial Vaginitis-Urine Negative    Candida Urine Negative    Molecular Comment      This specimen does not meet the strict criteria set by the FDA. The   Molecular Comment      result interpretation should be considered in conjunction with the   Molecular Comment patient's clinical history.   Urinalysis, Routine w reflex microscopic     Status: Abnormal   Collection Time: 08/22/20  3:35 PM  Result Value Ref Range   Color, Urine YELLOW YELLOW   APPearance CLOUDY (A) CLEAR   Specific Gravity, Urine 1.011 1.001 - 1.035   pH 5.5 5.0 - 8.0   Glucose, UA NEGATIVE NEGATIVE   Bilirubin Urine NEGATIVE NEGATIVE   Ketones, ur NEGATIVE NEGATIVE   Hgb urine dipstick 3+ (A) NEGATIVE  Protein, ur 1+ (A) NEGATIVE   Nitrite POSITIVE (A) NEGATIVE   Leukocytes,Ua 3+ (A) NEGATIVE   WBC, UA > OR = 60 (A) 0 - 5 /HPF   RBC / HPF 3-10 (A) 0 - 2 /HPF   Squamous Epithelial / LPF NONE SEEN < OR = 5 /HPF   Bacteria, UA MODERATE (A) NONE SEEN /HPF  Urine Culture     Status: Abnormal   Collection Time: 08/22/20  3:35 PM   Specimen: Urine  Result Value Ref Range   MICRO NUMBER: [id number]    SPECIMEN QUALITY: Adequate    Sample Source NOT GIVEN    STATUS: FINAL    ISOLATE 1: Escherichia coli (A)     Comment: Greater than 100,000 CFU/mL of Escherichia coli      Susceptibility   Escherichia coli - URINE CULTURE, REFLEX    AMOX/CLAVULANIC 4 Sensitive     AMPICILLIN 16 Intermediate     AMPICILLIN/SULBACTAM 4 Sensitive     CEFAZOLIN* <=4 Not Reportable      * For infections other than uncomplicated UTIcaused by E. coli, K. pneumoniae or P. mirabilis:Cefazolin is resistant if MIC > or = 8 mcg/mL.(Distinguishing susceptible versus intermediatefor isolates with MIC < or = 4 mcg/mL requiresadditional testing.)For uncomplicated UTI caused by E. coli,K. pneumoniae or P. mirabilis: Cefazolin issusceptible if MIC <32 mcg/mL and  predictssusceptible to the oral agents cefaclor, cefdinir,cefpodoxime, cefprozil, cefuroxime, cephalexinand loracarbef.    CEFEPIME <=1 Sensitive     CEFTRIAXONE <=1 Sensitive     CIPROFLOXACIN >=4 Resistant     LEVOFLOXACIN >=8 Resistant     ERTAPENEM <=0.5 Sensitive     GENTAMICIN <=1 Sensitive     IMIPENEM <=0.25 Sensitive     NITROFURANTOIN <=16 Sensitive     PIP/TAZO <=4 Sensitive     TOBRAMYCIN <=1 Sensitive     TRIMETH/SULFA* <=20 Sensitive      * For infections other than uncomplicated UTIcaused by E. coli, K. pneumoniae or P. mirabilis:Cefazolin is resistant if MIC > or = 8 mcg/mL.(Distinguishing susceptible versus intermediatefor isolates with MIC < or = 4 mcg/mL requiresadditional testing.)For uncomplicated UTI caused by E. coli,K. pneumoniae or P. mirabilis: Cefazolin issusceptible if MIC <32 mcg/mL and predictssusceptible to the oral agents cefaclor, cefdinir,cefpodoxime, cefprozil, cefuroxime, cephalexinand loracarbef.Legend:S = Susceptible  I = IntermediateR = Resistant  NS = Not susceptible* = Not tested  NR = Not reported**NN = See antimicrobic comments  Microalbumin / creatinine urine ratio     Status: Abnormal   Collection Time: 08/22/20  3:35 PM  Result Value Ref Range   Creatinine, Urine 48 20 - 275 mg/dL   Microalb, Ur 9.8 mg/dL    Comment: Reference Range Not established    Microalb Creat Ratio 204 (H) <30 mcg/mg creat    Comment: . The ADA defines abnormalities in albumin excretion as follows: Marland Kitchen Albuminuria Category        Result (mcg/mg creatinine) . Normal to Mildly increased   <30 Moderately increased         30-299  Severely increased           > OR = 300 . The ADA recommends that at least two of three specimens collected within a 3-6 month period be abnormal before considering a patient to be within a diagnostic category.   Comprehensive metabolic panel     Status: Abnormal   Collection Time: 08/22/20  3:35 PM  Result Value Ref Range   Sodium 139  135 - 145 mEq/L  Potassium 5.3 No hemolysis seen (H) 3.5 - 5.1 mEq/L   Chloride 102 96 - 112 mEq/L   CO2 25 19 - 32 mEq/L   Glucose, Bld 118 (H) 70 - 99 mg/dL   BUN 28 (H) 6 - 23 mg/dL   Creatinine, Ser 1.33 (H) 0.40 - 1.20 mg/dL   Total Bilirubin 0.4 0.2 - 1.2 mg/dL   Alkaline Phosphatase 100 39 - 117 U/L   AST 15 0 - 37 U/L   ALT 10 0 - 35 U/L   Total Protein 6.8 6.0 - 8.3 g/dL   Albumin 3.9 3.5 - 5.2 g/dL   GFR 40.41 (L) >60.00 mL/min    Comment: Calculated using the CKD-EPI Creatinine Equation (2021)   Calcium 9.2 8.4 - 10.5 mg/dL  CBC with Differential/Platelet     Status: Abnormal   Collection Time: 08/22/20  3:35 PM  Result Value Ref Range   WBC 7.5 4.0 - 10.5 K/uL   RBC 4.26 3.87 - 5.11 Mil/uL   Hemoglobin 11.1 (L) 12.0 - 15.0 g/dL   HCT 34.5 (L) 36.0 - 46.0 %   MCV 80.9 78.0 - 100.0 fl   MCHC 32.2 30.0 - 36.0 g/dL   RDW 18.6 (H) 11.5 - 15.5 %   Platelets 257.0 150.0 - 400.0 K/uL   Neutrophils Relative % 71.9 43.0 - 77.0 %   Lymphocytes Relative 16.2 12.0 - 46.0 %   Monocytes Relative 7.9 3.0 - 12.0 %   Eosinophils Relative 2.9 0.0 - 5.0 %   Basophils Relative 1.1 0.0 - 3.0 %   Neutro Abs 5.4 1.4 - 7.7 K/uL   Lymphs Abs 1.2 0.7 - 4.0 K/uL   Monocytes Absolute 0.6 0.1 - 1.0 K/uL   Eosinophils Absolute 0.2 0.0 - 0.7 K/uL   Basophils Absolute 0.1 0.0 - 0.1 K/uL  Iron, TIBC and Ferritin Panel     Status: Abnormal   Collection Time: 08/22/20  3:35 PM  Result Value Ref Range   Iron 43 (L) 45 - 160 mcg/dL   TIBC 394 250 - 450 mcg/dL (calc)   %SAT 11 (L) 16 - 45 % (calc)   Ferritin 18 16 - 288 ng/mL  Folate     Status: None   Collection Time: 08/22/20  3:35 PM  Result Value Ref Range   Folate >24.4 >5.9 ng/mL  Vitamin B12     Status: Abnormal   Collection Time: 08/22/20  3:35 PM  Result Value Ref Range   Vitamin B-12 >1550 (H) 211 - 911 pg/mL  Hemoglobin A1c     Status: Abnormal   Collection Time: 08/22/20  3:35 PM  Result Value Ref Range   Hgb A1c MFr Bld 8.1  (H) 4.6 - 6.5 %    Comment: Glycemic Control Guidelines for People with Diabetes:Non Diabetic:  <6%Goal of Therapy: <7%Additional Action Suggested:  >8%   TSH     Status: Abnormal   Collection Time: 08/22/20  3:35 PM  Result Value Ref Range   TSH 0.10 (L) 0.35 - 4.50 uIU/mL  Vitamin D (25 hydroxy)     Status: None   Collection Time: 08/22/20  3:35 PM  Result Value Ref Range   VITD 35.97 30.00 - 100.00 ng/mL  MICROSCOPIC MESSAGE     Status: None   Collection Time: 08/22/20  3:35 PM  Result Value Ref Range   Note      Comment: This urine was analyzed for the presence of WBC,  RBC, bacteria, casts, and other formed elements.  Only those elements seen were reported. . .    Objective  Body mass index is 42.94 kg/m. Wt Readings from Last 3 Encounters:  10/15/20 262 lb (118.8 kg)  08/22/20 236 lb (107 kg)  08/20/20 236 lb (107 kg)   Temp Readings from Last 3 Encounters:  10/15/20 97.8 F (36.6 C) (Temporal)  08/22/20 97.9 F (36.6 C) (Oral)  07/15/20 (!) 97.2 F (36.2 C) (Temporal)   BP Readings from Last 3 Encounters:  10/15/20 (!) 150/86  08/22/20 110/66  08/20/20 108/62   Pulse Readings from Last 3 Encounters:  10/15/20 (!) 56  08/22/20 75  08/20/20 (!) 54    Physical Exam Vitals and nursing note reviewed.  Constitutional:      Appearance: Normal appearance. She is well-developed and well-groomed. She is morbidly obese.  HENT:     Head: Normocephalic and atraumatic.  Eyes:     Conjunctiva/sclera: Conjunctivae normal.     Pupils: Pupils are equal, round, and reactive to light.  Cardiovascular:     Rate and Rhythm: Normal rate and regular rhythm.     Heart sounds: Normal heart sounds. No murmur heard. Pulmonary:     Effort: Pulmonary effort is normal.     Breath sounds: Normal breath sounds.  Chest:     Comments: Crackles b/l lungs ant.  Skin:    General: Skin is warm and dry.       Neurological:     General: No focal deficit present.     Mental  Status: She is alert and oriented to person, place, and time. Mental status is at baseline.     Gait: Gait normal.  Psychiatric:        Attention and Perception: Attention and perception normal.        Mood and Affect: Mood and affect normal.        Speech: Speech normal.        Behavior: Behavior normal. Behavior is cooperative.        Thought Content: Thought content normal.        Cognition and Memory: Cognition and memory normal.        Judgment: Judgment normal.    Assessment  Plan  Open wound - Plan: mupirocin ointment (BACTROBAN) 2 %, Ambulatory referral to Wound Clinic, collagenase (SANTYL) ointment, doxycycline (VIBRA-TABS) 100 MG tablet  Yeast vaginitis - Plan: fluconazole (DIFLUCAN) 150 MG tablet  Acute pain of left knee - Plan: DG Knee Complete 4 Views Left  Pain and swelling of left knee - Plan: DG Knee Complete 4 Views Left  Bronchospasm - Plan: DG Chest 2 View F/u with pulm    Hyperkalemia - Plan: Basic Metabolic Panel (BMET)  Hypothyroidism, unspecified type - Plan: TSH Levo 175 mcg qd and 1/2 sundays repeat today   Provider: Dr. Olivia Mackie [name]

## 2020-10-15 NOTE — Patient Instructions (Addendum)
[id number] [phone] Not available [address]   [place] [address]     Centrum or nature made multivitamin with iron daily   Specialties     Pulmonary Disease     -call lung specialist for appt   Go to [facility] outpatient imaging for chest Xray and knee xray    Wound care center  [address]  [phone]  Dove antibacterial soap body wash or chlorhexadine   Bactroban  Valtrex for shingles    Santyl at night x 1 week to 10 day s  Senna S (senna (laxative) colace stool softner)  Or  Colace alone (stool softner)   Wound Care, Adult Taking care of your wound properly can help to prevent pain, infection, and scarring. It can also help your wound heal more quickly. Follow instructionsfrom your health care provider about how to care for your wound. Supplies needed: Soap and water. Wound cleanser. Gauze. If needed, a clean bandage (dressing) or other type of wound dressing material to cover or place in the wound. Follow your health care provider's instructions about what dressing supplies to use. Cream or ointment to apply to the wound, if told by your health care provider. How to care for your wound Cleaning the wound Ask your health care provider how to clean the wound. This may include: Using mild soap and water or a wound cleanser. Using a clean gauze to pat the wound dry after cleaning it. Do not rub or scrub the wound. Dressing care Wash your hands with soap and water for at least 20 seconds before and after you change the dressing. If soap and water are not available, use hand sanitizer. Change your dressing as told by your health care provider. This may include: Cleaning or rinsing out (irrigating) the wound. Placing a dressing over the wound or in the wound (packing). Covering the wound with an outer dressing. Leave any stitches (sutures), skin glue, or adhesive strips in place. These skin closures may need to stay in place  for 2 weeks or longer. If adhesive strip edges start to loosen and curl up, you may trim the loose edges. Do not remove adhesive strips completely unless your health care provider tells you to do that. Ask your health care provider when you can leave the wound uncovered. Checking for infection Check your wound area every day for signs of infection. Check for: More redness, swelling, or pain. Fluid or blood. Warmth. Pus or a bad smell.  Follow these instructions at home Medicines If you were prescribed an antibiotic medicine, cream, or ointment, take or apply it as told by your health care provider. Do not stop using the antibiotic even if your condition improves. If you were prescribed pain medicine, take it 30 minutes before you do any wound care or as told by your health care provider. Take over-the-counter and prescription medicines only as told by your health care provider. Eating and drinking Eat a diet that includes protein, vitamin A, vitamin C, and other nutrient-rich foods to help the wound heal. Foods rich in protein include meat, fish, eggs, dairy, beans, and nuts. Foods rich in vitamin A include carrots and dark green, leafy vegetables. Foods rich in vitamin C include citrus fruits, tomatoes, broccoli, and peppers. Drink enough fluid to keep your urine pale yellow. General instructions Do not take baths, swim, use a hot tub, or do anything that would put the wound underwater until your health care  provider approves. Ask your health care provider if you may take showers. You may only be allowed to take sponge baths. Do not scratch or pick at the wound. Keep it covered as told by your health care provider. Return to your normal activities as told by your health care provider. Ask your health care provider what activities are safe for you. Protect your wound from the sun when you are outside for the first 6 months, or for as long as told by your health care provider. Cover up the  scar area or apply sunscreen that has an SPF of at least 14. Do not use any products that contain nicotine or tobacco, such as cigarettes, e-cigarettes, and chewing tobacco. These may delay wound healing. If you need help quitting, ask your health care provider. Keep all follow-up visits as told by your health care provider. This is important. Contact a health care provider if: You received a tetanus shot and you have swelling, severe pain, redness, or bleeding at the injection site. Your pain is not controlled with medicine. You have any of these signs of infection: More redness, swelling, or pain around the wound. Fluid or blood coming from the wound. Warmth coming from the wound. Pus or a bad smell coming from the wound. A fever or chills. You are nauseous or you vomit. You are dizzy. Get help right away if: You have a red streak of skin near the area around your wound. Your wound has been closed with staples, sutures, skin glue, or adhesive strips and it begins to open up and separate. Your wound is bleeding, and the bleeding does not stop with gentle pressure. You have a rash. You faint. You have trouble breathing. These symptoms may represent a serious problem that is an emergency. Do not wait to see if the symptoms will go away. Get medical help right away. Call your local emergency services (911 in the [place] Do not drive yourself to the hospital. Summary Always wash your hands with soap and water for at least 20 seconds before and after changing your dressing. Change your dressing as told by your health care provider. To help with healing, eat foods that are rich in protein, vitamin A, vitamin C, and other nutrients. Check your wound every day for signs of infection. Contact your health care provider if you suspect that your wound is infected. This information is not intended to replace advice given to you by your health care provider. Make sure you discuss any questions you have  with your healthcare provider. Document Revised: 12/23/2018 Document Reviewed: 12/23/2018 Elsevier Patient Education  2022 [employer].

## 2020-10-22 NOTE — Progress Notes (Deleted)
No show

## 2020-10-23 ENCOUNTER — Encounter: Payer: Medicare HMO | Admitting: Pain Medicine

## 2020-10-23 DIAGNOSIS — Z79891 Long term (current) use of opiate analgesic: Secondary | ICD-10-CM

## 2020-10-23 DIAGNOSIS — E1142 Type 2 diabetes mellitus with diabetic polyneuropathy: Secondary | ICD-10-CM

## 2020-10-23 DIAGNOSIS — M797 Fibromyalgia: Secondary | ICD-10-CM

## 2020-10-23 DIAGNOSIS — Z79899 Other long term (current) drug therapy: Secondary | ICD-10-CM

## 2020-10-23 DIAGNOSIS — G894 Chronic pain syndrome: Secondary | ICD-10-CM

## 2020-10-23 DIAGNOSIS — G8929 Other chronic pain: Secondary | ICD-10-CM

## 2020-10-25 ENCOUNTER — Telehealth: Payer: Self-pay | Admitting: Internal Medicine

## 2020-10-25 ENCOUNTER — Ambulatory Visit: Payer: Medicare HMO | Admitting: Physician Assistant

## 2020-10-25 DIAGNOSIS — I872 Venous insufficiency (chronic) (peripheral): Secondary | ICD-10-CM | POA: Diagnosis not present

## 2020-10-25 DIAGNOSIS — L97422 Non-pressure chronic ulcer of left heel and midfoot with fat layer exposed: Secondary | ICD-10-CM | POA: Diagnosis not present

## 2020-10-25 DIAGNOSIS — I4819 Other persistent atrial fibrillation: Secondary | ICD-10-CM | POA: Diagnosis not present

## 2020-10-25 DIAGNOSIS — I152 Hypertension secondary to endocrine disorders: Secondary | ICD-10-CM | POA: Diagnosis not present

## 2020-10-25 DIAGNOSIS — E11621 Type 2 diabetes mellitus with foot ulcer: Secondary | ICD-10-CM | POA: Diagnosis not present

## 2020-10-25 DIAGNOSIS — G8194 Hemiplegia, unspecified affecting left nondominant side: Secondary | ICD-10-CM | POA: Diagnosis not present

## 2020-10-25 DIAGNOSIS — I69319 Unspecified symptoms and signs involving cognitive functions following cerebral infarction: Secondary | ICD-10-CM | POA: Diagnosis not present

## 2020-10-25 DIAGNOSIS — E1151 Type 2 diabetes mellitus with diabetic peripheral angiopathy without gangrene: Secondary | ICD-10-CM | POA: Diagnosis not present

## 2020-10-25 DIAGNOSIS — E1142 Type 2 diabetes mellitus with diabetic polyneuropathy: Secondary | ICD-10-CM | POA: Diagnosis not present

## 2020-10-25 DIAGNOSIS — E1159 Type 2 diabetes mellitus with other circulatory complications: Secondary | ICD-10-CM | POA: Diagnosis not present

## 2020-10-25 NOTE — Telephone Encounter (Signed)
Cristy Reel from [facility] is calling to see if Dr.Tracy can put in an order for the patient to have an xray done at her home.She stated the patient has fluid in her legs and is unable to leave her home.Cristy can be reached at office [phone] or [phone].

## 2020-10-28 NOTE — Telephone Encounter (Signed)
Fluid in legs would not be Xray would be ultrasound  Where is fluid in the leg or ankle and is it red/swollen/painful? She has a wound in right lower leg are they worried about infection as reason for needing Xray?    She should be also following with wound clinic has Mrs. Russak seen them?  What leg has fluid left or right?   Ask the nurse and mrs Forbis please

## 2020-10-29 NOTE — Telephone Encounter (Signed)
Spoke with the nurse. Patient is having shortness of breath when lying flat. Needing an order for a chest x-ray.   Patient's right leg blistered, opened up, and caused a sore. Patient was unable to make it to her wound care appointment but home helath has since received wound care orders.   Needing okay to give verbal order for mobile chest x-ray.

## 2020-10-30 ENCOUNTER — Encounter: Payer: Medicare HMO | Attending: Physician Assistant | Admitting: Physician Assistant

## 2020-10-30 ENCOUNTER — Other Ambulatory Visit: Payer: Self-pay

## 2020-10-30 DIAGNOSIS — E11622 Type 2 diabetes mellitus with other skin ulcer: Secondary | ICD-10-CM | POA: Insufficient documentation

## 2020-10-30 DIAGNOSIS — Z880 Allergy status to penicillin: Secondary | ICD-10-CM | POA: Insufficient documentation

## 2020-10-30 DIAGNOSIS — Z7901 Long term (current) use of anticoagulants: Secondary | ICD-10-CM | POA: Insufficient documentation

## 2020-10-30 DIAGNOSIS — L97522 Non-pressure chronic ulcer of other part of left foot with fat layer exposed: Secondary | ICD-10-CM | POA: Diagnosis not present

## 2020-10-30 DIAGNOSIS — I872 Venous insufficiency (chronic) (peripheral): Secondary | ICD-10-CM | POA: Diagnosis not present

## 2020-10-30 DIAGNOSIS — L97322 Non-pressure chronic ulcer of left ankle with fat layer exposed: Secondary | ICD-10-CM | POA: Insufficient documentation

## 2020-10-30 DIAGNOSIS — E11621 Type 2 diabetes mellitus with foot ulcer: Secondary | ICD-10-CM | POA: Diagnosis not present

## 2020-10-30 DIAGNOSIS — Z886 Allergy status to analgesic agent status: Secondary | ICD-10-CM | POA: Diagnosis not present

## 2020-10-30 DIAGNOSIS — I10 Essential (primary) hypertension: Secondary | ICD-10-CM | POA: Diagnosis not present

## 2020-10-30 DIAGNOSIS — S50811A Abrasion of right forearm, initial encounter: Secondary | ICD-10-CM | POA: Diagnosis not present

## 2020-10-30 DIAGNOSIS — E1140 Type 2 diabetes mellitus with diabetic neuropathy, unspecified: Secondary | ICD-10-CM | POA: Insufficient documentation

## 2020-10-30 DIAGNOSIS — Z86718 Personal history of other venous thrombosis and embolism: Secondary | ICD-10-CM | POA: Insufficient documentation

## 2020-10-30 DIAGNOSIS — W228XXA Striking against or struck by other objects, initial encounter: Secondary | ICD-10-CM | POA: Insufficient documentation

## 2020-10-30 DIAGNOSIS — S50311A Abrasion of right elbow, initial encounter: Secondary | ICD-10-CM | POA: Diagnosis not present

## 2020-10-30 DIAGNOSIS — L97812 Non-pressure chronic ulcer of other part of right lower leg with fat layer exposed: Secondary | ICD-10-CM | POA: Insufficient documentation

## 2020-10-30 NOTE — Progress Notes (Addendum)
SHARANDA, STUEBE ([id number]) Visit Report for 10/30/2020 Chief Complaint Document Details Patient Name: [name], [name] Date of Service: 10/30/2020 2:45 PM Medical Record Number: [id number] Patient Account Number: [id number] Date of Birth/Sex: 1950/04/01 (70 y.o. F) Treating RN: Primary Care Provider: [name], Olivia Mackie Other Clinician: Referring Provider: [name], Olivia Mackie Treating Provider/Extender: Skipper Cliche in Treatment: 17 Information Obtained from: Patient Chief Complaint Right Elbow Ulcer, Right Leg Ulcer, and Right Forearm Ulcer Electronic Signature(s) Signed: 10/30/2020 4:01:10 PM By: Worthy Keeler PA-C Previous Signature: 10/30/2020 3:08:43 PM Version By: Worthy Keeler PA-C Entered By: Worthy Keeler on 10/30/2020 16:01:09 HADLEY, BOMBOY ([id number]) -------------------------------------------------------------------------------- HPI Details Patient Name: [name] Date of Service: 10/30/2020 2:45 PM Medical Record Number: [id number] Patient Account Number: [id number] Date of Birth/Sex: [birth year] (70 y.o. F) Treating RN: Primary Care Provider: Orland Mustard Other Clinician: Referring Provider: [name], Olivia Mackie Treating Provider/Extender: Skipper Cliche in Treatment: 17 History of Present Illness HPI Description: 06/28/20 upon evaluation today patient appears to be doing unfortunately somewhat poorly in regard to wounds that she has over her lower extremities. She has a wound on the left distal second toe, left posterior heel, and right anterior lower leg. With that being said all in all none of the wounds appear to be too bad the one that hurts her the most is actually the wound on the posterior heel which actually is also one of the smallest. Nonetheless I think there is some callus hiding what is really going on in this area. Fortunately there does not appear to be any obvious evidence of infection which is great news. Patient  does have a history of diabetes mellitus type 2, chronic venous insufficiency, hypertension, history of DVTs, and is on long-term anticoagulant therapy, Eliquis. 07/05/20 upon evaluation today patient appears to be doing well currently with regard to her wounds. I feel like she is making progress which is great news and overall there is no signs of active infection at this time. No fevers, chills, nausea, vomiting, or diarrhea. 4/29; patient has a only a small area remaining on the tip of her left second toe and a small area remaining on the left heel. The area on the right anterior lower leg is healed. They tell me that she had remote podiatric surgery on the toes of the left foot however they are very fixed in position and I wonder whether they are making it difficult to relieve pressure in her foot wear. She is a type II diabetic with neuropathy as well. 08/01/2020 upon evaluation today patient appears to be doing well currently in regard to her wounds. In fact everything appears to be doing much better. The posterior aspect of her heel is actually giving her some trouble not the Achilles area but a small wound that we did not even have marked. In fact I had noted this before. Nonetheless we are going to addressed that today. 5/27; most of the patient's wounds are healed including the left Achilles heel left toe. Right forearm is also healed. She has a new abrasion posteriorly in the right elbow area just above the olecranon this happened today with an abrasion from her car door 6/15; the patient's wounds on the left Achilles and left second toe remain healed. The area on the right forearm is also just about healed in fact the skin I replaced last time is looking viable which is good. She had me look at the left heel again which is not in the area of  the wound more towards the tip of the heel at the Achilles insertion site as well as the tip of the left foot second toe. In the Achilles area that is  clearly is friction probably in bed at night. Not really near where the wound was. The left second toe remains healed although this is hammered and overhanging first toe also puts pressure in this area 09/13/2020 upon evaluation today patient appears to be doing well with regard to all previous wounds that she had. Everything is completely healed. With that being said I do think that the patient unfortunately has a new skin tear on the right forearm which is due to her put a Band-Aid on it and then when it pulled off this caused some issues with skin tearing. Nonetheless I think that this needs to be addressed today. Hopefully will take too long to get this to heal. She is having some issues with pain in her heel but I think this is more related to be honest to her neuropathy as opposed to anything else. Readmission: 10/30/2020 upon evaluation today patient appears to be doing poorly in regard to her legs bilaterally. She is also having an issue with her fourth toe on the left. Fortunately there does not appear to be any signs of active infection at this time. No fevers, chills, nausea, vomiting, or diarrhea. It is actually been quite a while since we have seen her. In fact June 24 was the last time that she was here in the clinic. She tells me she did not come back because things were just doing "pretty well". Electronic Signature(s) Signed: 10/31/2020 8:25:29 AM By: Worthy Keeler PA-C Entered By: Worthy Keeler on 10/31/2020 08:25:28 MAHASIN, DOLNEY ([id number]) -------------------------------------------------------------------------------- Physical Exam Details Patient Name: [name] Date of Service: 10/30/2020 2:45 PM Medical Record Number: [id number] Patient Account Number: [id number] Date of Birth/Sex: 07-12-1950 (70 y.o. F) Treating RN: Primary Care Provider: Orland Mustard Other Clinician: Referring Provider: [name], Olivia Mackie Treating Provider/Extender: Skipper Cliche in Treatment: 1 Constitutional patient is hypertensive.. pulse regular and within target range for patient.Marland Kitchen respirations regular, non-labored and within target range for patient.Marland Kitchen temperature within target range for patient.. Well-nourished and well-hydrated in no acute distress. Eyes conjunctiva clear no eyelid edema noted. pupils equal round and reactive to light and accommodation. Ears, Nose, Mouth, and Throat no gross abnormality of ear auricles or external auditory canals. normal hearing noted during conversation. mucus membranes moist. Respiratory normal breathing without difficulty. Cardiovascular 2+ dorsalis pedis/posterior tibialis pulses. no clubbing, cyanosis, significant edema, <3 sec cap refill. Musculoskeletal normal gait and posture. no significant deformity or arthritic changes, no loss or range of motion, no clubbing. Psychiatric this patient is able to make decisions and demonstrates good insight into disease process. Alert and Oriented x 3. pleasant and cooperative. Notes Upon inspection patient had issues with wounds on her right leg, a blister area over the fourth toe left foot, and subsequently also had a area on her right lateral elbow where she had hit this I believe this is actually the same place that I previously had taken care of for her as well before she quit coming back. It is at least in the same area. Electronic Signature(s) Signed: 10/31/2020 8:26:19 AM By: Worthy Keeler PA-C Entered By: Worthy Keeler on 10/31/2020 08:26:18 KAREEMAH, GORGAS ([id number]) -------------------------------------------------------------------------------- Physician Orders Details Patient Name: [name] Date of Service: 10/30/2020 2:45 PM Medical Record Number: [id number] Patient Account Number: [id number]  Date of Birth/Sex: [birth year] (70 y.o. F) Treating RN: Cornell Barman Primary Care Provider: [name], Olivia Mackie Other Clinician: Referring Provider:  [name], Olivia Mackie Treating Provider/Extender: Skipper Cliche in Treatment: 17 Verbal / Phone Orders: No Diagnosis Coding ICD-10 Coding Code Description E11.621 Type 2 diabetes mellitus with foot ulcer I87.2 Venous insufficiency (chronic) (peripheral) L97.812 Non-pressure chronic ulcer of other part of right lower leg with fat layer exposed S50.811A Abrasion of right forearm, initial encounter S50.311D Abrasion of right elbow, subsequent encounter Z79.01 Long term (current) use of anticoagulants Follow-up Appointments o Return Appointment in 1 week. Home Health Wound #13 [place] for wound care. May utilize formulary equivalent dressing for wound treatment orders unless otherwise specified. Home Health Nurse may visit PRN to address patientos wound care needs. o Scheduled days for dressing changes to be completed; exception, patient has scheduled wound care visit that day. - Leave in place unless it slides down more than one inch. Patient is seen in the wound care center on Wednesdays. o **Please direct any NON-WOUND related issues/requests for orders to patient's Primary Care Physician. **If current dressing causes regression in wound condition, may D/C ordered dressing product/s and apply Normal Saline Moist Dressing daily until next [place] or Other MD appointment. **Notify Wound Healing Center of regression in wound condition at [phone]. Bathing/ Shower/ Hygiene o May shower with wound dressing protected with water repellent cover or cast protector. Edema Control - Lymphedema / Segmental Compressive Device / Other o Elevate, Exercise Daily and Avoid Standing for Long Periods of Time. o Elevate legs to the level of the heart and pump ankles as often as possible o Elevate leg(s) parallel to the floor when sitting. Wound Treatment Wound #13 - Lower Leg Wound  Laterality: Right, Midline Primary Dressing: Silvercel Small 2x2 (in/in) (Home Health) PRN/30 Days Discharge Instructions: Apply Silvercel Small 2x2 (in/in) as instructed Secondary Dressing: ABD Pad 5x9 (in/in) (Home Health) PRN/30 Days Discharge Instructions: Cover with ABD pad Compression Wrap: Profore Lite LF 3 Multilayer Compression Bandaging System (Home Health) PRN/30 Days Discharge Instructions: Apply 3 multi-layer wrap as prescribed. Radiology o X-ray, foot - 3-view left 4th toe Electronic Signature(s) Signed: 10/31/2020 4:34:12 PM By: Gretta Cool, BSN, RN, CWS, Kim RN, BSN Signed: 10/31/2020 5:38:02 PM By: Worthy Keeler PA-C Entered By: Gretta Cool BSN, RN, CWS, Kim on 10/30/2020 16:37:05 SAHANNA, EASTEP ([id number], REBURN ([id number]) -------------------------------------------------------------------------------- Problem List Details Patient Name: [name] Date of Service: 10/30/2020 2:45 PM Medical Record Number: [id number] Patient Account Number: [id number] Date of Birth/Sex: Mar 12, 1951 (70 y.o. F) Treating RN: Primary Care Provider: [name], Olivia Mackie Other Clinician: Referring Provider: [name], Olivia Mackie Treating Provider/Extender: Skipper Cliche in Treatment: 17 Active Problems ICD-10 Encounter Code Description Active Date MDM Diagnosis E11.621 Type 2 diabetes mellitus with foot ulcer 06/28/2020 No Yes I87.2 Venous insufficiency (chronic) (peripheral) 06/28/2020 No Yes L97.812 Non-pressure chronic ulcer of other part of right lower leg with fat layer 06/28/2020 No Yes exposed S50.811A Abrasion of right forearm, initial encounter 10/30/2020 No Yes S50.311D Abrasion of right elbow, subsequent encounter 08/16/2020 No Yes Z79.01 Long term (current) use of anticoagulants 06/28/2020 No Yes Inactive Problems ICD-10 Code Description Active Date Inactive Date I10 Essential (primary) hypertension 06/28/2020 06/28/2020 Z86.718 Personal history of other venous  thrombosis and embolism 06/28/2020 06/28/2020 Resolved Problems ICD-10 Code Description Active Date Resolved Date L97.522 Non-pressure chronic ulcer of other part of left foot with fat layer exposed 06/28/2020 06/28/2020  [id number] Non-pressure chronic ulcer of left ankle with fat layer exposed 06/28/2020 06/28/2020 Electronic Signature(s) MALAYNA, TEST ([id number]) Signed: 10/30/2020 4:00:44 PM By: Worthy Keeler PA-C Previous Signature: 10/30/2020 3:08:35 PM Version By: Worthy Keeler PA-C Entered By: Worthy Keeler on 10/30/2020 16:00:44 KELLAN, DESHOTEL ([id number]) -------------------------------------------------------------------------------- Progress Note Details Patient Name: [name] Date of Service: 10/30/2020 2:45 PM Medical Record Number: [id number] Patient Account Number: [id number] Date of Birth/Sex: Aug 08, 1950 (70 y.o. F) Treating RN: Primary Care Provider: [name], Olivia Mackie Other Clinician: Referring Provider: [name], Olivia Mackie Treating Provider/Extender: Skipper Cliche in Treatment: 17 Subjective Chief Complaint Information obtained from Patient Right Elbow Ulcer, Right Leg Ulcer, and Right Forearm Ulcer History of Present Illness (HPI) 06/28/20 upon evaluation today patient appears to be doing unfortunately somewhat poorly in regard to wounds that she has over her lower extremities. She has a wound on the left distal second toe, left posterior heel, and right anterior lower leg. With that being said all in all none of the wounds appear to be too bad the one that hurts her the most is actually the wound on the posterior heel which actually is also one of the smallest. Nonetheless I think there is some callus hiding what is really going on in this area. Fortunately there does not appear to be any obvious evidence of infection which is great news. Patient does have a history of diabetes mellitus type 2, chronic venous insufficiency, hypertension, history of DVTs,  and is on long-term anticoagulant therapy, Eliquis. 07/05/20 upon evaluation today patient appears to be doing well currently with regard to her wounds. I feel like she is making progress which is great news and overall there is no signs of active infection at this time. No fevers, chills, nausea, vomiting, or diarrhea. 4/29; patient has a only a small area remaining on the tip of her left second toe and a small area remaining on the left heel. The area on the right anterior lower leg is healed. They tell me that she had remote podiatric surgery on the toes of the left foot however they are very fixed in position and I wonder whether they are making it difficult to relieve pressure in her foot wear. She is a type II diabetic with neuropathy as well. 08/01/2020 upon evaluation today patient appears to be doing well currently in regard to her wounds. In fact everything appears to be doing much better. The posterior aspect of her heel is actually giving her some trouble not the Achilles area but a small wound that we did not even have marked. In fact I had noted this before. Nonetheless we are going to addressed that today. 5/27; most of the patient's wounds are healed including the left Achilles heel left toe. Right forearm is also healed. She has a new abrasion posteriorly in the right elbow area just above the olecranon this happened today with an abrasion from her car door 6/15; the patient's wounds on the left Achilles and left second toe remain healed. The area on the right forearm is also just about healed in fact the skin I replaced last time is looking viable which is good. She had me look at the left heel again which is not in the area of the wound more towards the tip of the heel at the Achilles insertion site as well as the tip of the left foot second toe. In the Achilles area that is clearly is friction probably in bed at night. Not really  near where the wound was. The left second toe remains  healed although this is hammered and overhanging first toe also puts pressure in this area 09/13/2020 upon evaluation today patient appears to be doing well with regard to all previous wounds that she had. Everything is completely healed. With that being said I do think that the patient unfortunately has a new skin tear on the right forearm which is due to her put a Band-Aid on it and then when it pulled off this caused some issues with skin tearing. Nonetheless I think that this needs to be addressed today. Hopefully will take too long to get this to heal. She is having some issues with pain in her heel but I think this is more related to be honest to her neuropathy as opposed to anything else. Readmission: 10/30/2020 upon evaluation today patient appears to be doing poorly in regard to her legs bilaterally. She is also having an issue with her fourth toe on the left. Fortunately there does not appear to be any signs of active infection at this time. No fevers, chills, nausea, vomiting, or diarrhea. It is actually been quite a while since we have seen her. In fact June 24 was the last time that she was here in the clinic. She tells me she did not come back because things were just doing "pretty well". Patient History Information obtained from Patient. Allergies Penicillins (Severity: Severe, Reaction: hives), Terramycin (Severity: Severe, Reaction: hives), aspirin (Reaction: hives) Family History Cancer - Father, Diabetes - Paternal Grandparents,Father,Siblings, Heart Disease - Maternal Grandparents, Hypertension - Maternal Grandparents,Father, No family history of Kidney Disease, Lung Disease, Seizures, Stroke, Thyroid Problems. Social History Never smoker, Marital Status - Married, Alcohol Use - Never, Drug Use - No History, Caffeine Use - Rarely. Medical History Respiratory Patient has history of Asthma, Pneumothorax Cardiovascular Patient has history of Arrhythmia,  Hypertension Endocrine Patient has history of Type II Diabetes Hoch, [place] ([id number]) Musculoskeletal Patient has history of Gout, Osteoarthritis Neurologic Patient has history of Neuropathy - Left foot Oncologic Denies history of Received Chemotherapy, Received Radiation Hospitalization/Surgery History - sleeve gastrectomy. Medical And Surgical History Notes Constitutional Symptoms (General Health) Sleeve gastrectomy Gastrointestinal gastric bypass in 11/2012 Objective Constitutional patient is hypertensive.. pulse regular and within target range for patient.Marland Kitchen respirations regular, non-labored and within target range for patient.Marland Kitchen temperature within target range for patient.. Well-nourished and well-hydrated in no acute distress. Vitals Time Taken: 3:23 PM, Height: 65.5 in, Weight: 240 lbs, BMI: 39.3, Temperature: 98.3 F, Pulse: 47 bpm, Respiratory Rate: 20 breaths/min, Blood Pressure: 144/57 mmHg, Pulse Oximetry: 91 %. Eyes conjunctiva clear no eyelid edema noted. pupils equal round and reactive to light and accommodation. Ears, Nose, Mouth, and Throat no gross abnormality of ear auricles or external auditory canals. normal hearing noted during conversation. mucus membranes moist. Respiratory normal breathing without difficulty. Cardiovascular 2+ dorsalis pedis/posterior tibialis pulses. no clubbing, cyanosis, significant edema, Musculoskeletal normal gait and posture. no significant deformity or arthritic changes, no loss or range of motion, no clubbing. Psychiatric this patient is able to make decisions and demonstrates good insight into disease process. Alert and Oriented x 3. pleasant and cooperative. General Notes: Upon inspection patient had issues with wounds on her right leg, a blister area over the fourth toe left foot, and subsequently also had a area on her right lateral elbow where she had hit this I believe this is actually the same place that I previously  had taken care of for her as well  before she quit coming back. It is at least in the same area. Integumentary (Hair, Skin) Wound #12 status is Healed - Epithelialized. Original cause of wound was Trauma. The date acquired was: 09/06/2020. The wound has been in treatment 6 weeks. The wound is located on the Right Forearm. The wound measures 0cm length x 0cm width x 0cm depth; 0cm^2 area and 0cm^3 volume. There is Fat Layer (Subcutaneous Tissue) exposed. There is a medium amount of sanguinous drainage noted. There is medium (34-66%) pink granulation within the wound bed. There is a medium (34-66%) amount of necrotic tissue within the wound bed including Adherent Slough. Wound #13 status is Open. Original cause of wound was Gradually Appeared. The date acquired was: 10/21/2020. The wound is located on the Right,Midline Lower Leg. The wound measures 3.5cm length x 1.7cm width x 0.1cm depth; 4.673cm^2 area and 0.467cm^3 volume. There is Fat Layer (Subcutaneous Tissue) exposed. There is no tunneling or undermining noted. There is a large amount of serous drainage noted. The wound margin is flat and intact. There is large (67-100%) pink granulation within the wound bed. There is no necrotic tissue within the wound bed. Wound #14 status is Open. Original cause of wound was Trauma. The date acquired was: 10/25/2020. The wound is located on the Right,Lateral Elbow. The wound measures 0.9cm length x 0.5cm width x 0.1cm depth; 0.353cm^2 area and 0.035cm^3 volume. There is no tunneling or undermining noted. There is a medium amount of serosanguineous drainage noted. The wound margin is flat and intact. There is no granulation within the wound bed. There is a large (67-100%) amount of necrotic tissue within the wound bed including Adherent Slough. Wound #15 status is Open. Original cause of wound was Skin Tear/Laceration. The date acquired was: 10/25/2020. The wound is located on the Right,Medial Forearm. The wound  measures 0.5cm length x 0.4cm width x 0.1cm depth; 0.157cm^2 area and 0.016cm^3 volume. There is medium (34-66%) granulation within the wound bed. There is no necrotic tissue within the wound bed. MANAYA, BERTEAU ([id number]) Other Condition(s) Patient presents with Suspected Deep Tissue Injury located on the Left Foot. General Notes: 4th toe is discolored. Patient unaware of cause. Assessment Active Problems ICD-10 Type 2 diabetes mellitus with foot ulcer Venous insufficiency (chronic) (peripheral) Non-pressure chronic ulcer of other part of right lower leg with fat layer exposed Abrasion of right forearm, initial encounter Abrasion of right elbow, subsequent encounter Long term (current) use of anticoagulants Procedures Wound #13 Pre-procedure diagnosis of Wound #13 is a Venous Leg Ulcer located on the Right,Midline Lower Leg . There was a Three Layer Compression Therapy Procedure with a pre-treatment ABI of 1.2 by Cornell Barman, RN. Post procedure Diagnosis Wound #13: Same as Pre-Procedure Plan Follow-up Appointments: Return Appointment in 1 week. Home Health: Wound #13 Right,Midline Lower Leg: [place]: - [facility] for [facility] care. May utilize formulary equivalent dressing for wound treatment orders unless otherwise specified. Home Health Nurse may visit PRN to address patient s wound care needs. Scheduled days for dressing changes to be completed; exception, patient has scheduled wound care visit that day. - Leave in place unless it slides down more than one inch. Patient is seen in the wound care center on Wednesdays. **Please direct any NON-WOUND related issues/requests for orders to patient's Primary Care Physician. **If current dressing causes regression in wound condition, may D/C ordered dressing product/s and apply Normal Saline Moist Dressing daily until next [place] or Other MD appointment. **Notify  Wound Healing Center of  regression in wound condition at [phone]. Bathing/ Shower/ Hygiene: May shower with wound dressing protected with water repellent cover or cast protector. Edema Control - Lymphedema / Segmental Compressive Device / Other: Elevate, Exercise Daily and Avoid Standing for Long Periods of Time. Elevate legs to the level of the heart and pump ankles as often as possible Elevate leg(s) parallel to the floor when sitting. Radiology ordered were: X-ray, foot - 3-view left 4th toe WOUND #13: - Lower Leg Wound Laterality: Right, Midline Primary Dressing: Silvercel Small 2x2 (in/in) (Home Health) PRN/30 Days Discharge Instructions: Apply Silvercel Small 2x2 (in/in) as instructed Secondary Dressing: ABD Pad 5x9 (in/in) (Home Health) PRN/30 Days Discharge Instructions: Cover with ABD pad Compression Wrap: Profore Lite LF 3 Multilayer Compression Bandaging System (Home Health) PRN/30 Days Discharge Instructions: Apply 3 multi-layer wrap as prescribed. 1. Would recommend currently that we going continue with the wound care measures as before and the patient is in agreement with the plan. Fortunately there does not appear to be any signs of active infection which is great news. Overall however I am concerned about the fact that she has a wound on the right leg I do believe we need to wrap her with a 3 layer compression wrap to try to help keep this under control. 2. I am also concerned about the fact that we need to check out the toe as well. I do believe that this is a much bigger issue as far as worried about an ongoing issue with potential amputation is concerned. Is just blistered right now but again we really need to keep this under close eye. I explained to the patient she is going to need to make sure to come weekly for [place] to check this out. NAVIAH, STRITE ([id number]) We will see patient back for reevaluation in 1 week here in the clinic. If anything worsens or changes patient will contact our  office for additional recommendations. Electronic Signature(s) Signed: 10/31/2020 8:27:30 AM By: Worthy Keeler PA-C Entered By: Worthy Keeler on 10/31/2020 08:27:30 CONCHITA, GLOSS ([id number]) -------------------------------------------------------------------------------- ROS/PFSH Details Patient Name: [name] Date of Service: 10/30/2020 2:45 PM Medical Record Number: [id number] Patient Account Number: [id number] Date of Birth/Sex: 1951/02/28 (70 y.o. F) Treating RN: Cornell Barman Primary Care Provider: [name], Olivia Mackie Other Clinician: Referring Provider: [name], Olivia Mackie Treating Provider/Extender: Skipper Cliche in Treatment: 17 Information Obtained From Patient Constitutional Symptoms (General Health) Medical History: Past Medical History Notes: Sleeve gastrectomy Respiratory Medical History: Positive for: Asthma; Pneumothorax Cardiovascular Medical History: Positive for: Arrhythmia; Hypertension Gastrointestinal Medical History: Past Medical History Notes: gastric bypass in 11/2012 Endocrine Medical History: Positive for: Type II Diabetes Time with diabetes: 20 years Treated with: Oral agents, Diet Blood sugar tested every day: Yes Tested : Blood sugar testing results: Breakfast: 122; Bedtime: 146 Musculoskeletal Medical History: Positive for: Gout; Osteoarthritis Neurologic Medical History: Positive for: Neuropathy - Left foot Oncologic Medical History: Negative for: Received Chemotherapy; Received Radiation Immunizations Pneumococcal Vaccine: Received Pneumococcal Vaccination: Yes Received Pneumococcal Vaccination On or After 60th Birthday: Yes Implantable Devices None ALEYSSA, PALOMAR B. ([id number]) Hospitalization / Surgery History Type of Hospitalization/Surgery sleeve gastrectomy Family and Social History Cancer: Yes - Father; Diabetes: Yes - Paternal Grandparents,Father,Siblings; Heart Disease: Yes - Maternal  Grandparents; Hypertension: Yes - Maternal Grandparents,Father; Kidney Disease: No; Lung Disease: No; Seizures: No; Stroke: No; Thyroid Problems: No; Never smoker; Marital Status - Married; Alcohol Use: Never; Drug Use: No History; Caffeine Use: Rarely; Financial Concerns:  No; Food, Clothing or Shelter Needs: No; Support System Lacking: No; Transportation Concerns: No Engineer, maintenance) Signed: 10/31/2020 4:34:12 PM By: Gretta Cool, BSN, RN, CWS, Kim RN, BSN Signed: 10/31/2020 5:38:02 PM By: Worthy Keeler PA-C Entered By: Gretta Cool, BSN, RN, CWS, Kim on 10/30/2020 15:41:12 ELENIE, KONTOS ([id number]) -------------------------------------------------------------------------------- SuperBill Details Patient Name: [name] Date of Service: 10/30/2020 Medical Record Number: [id number] Patient Account Number: [id number] Date of Birth/Sex: 07-Nov-1950 (70 y.o. F) Treating RN: Cornell Barman Primary Care Provider: [name], Olivia Mackie Other Clinician: Referring Provider: [name], Olivia Mackie Treating Provider/Extender: Skipper Cliche in Treatment: 17 Diagnosis Coding ICD-10 Codes Code Description E11.621 Type 2 diabetes mellitus with foot ulcer I87.2 Venous insufficiency (chronic) (peripheral) L97.812 Non-pressure chronic ulcer of other part of right lower leg with fat layer exposed S50.811A Abrasion of right forearm, initial encounter S50.311D Abrasion of right elbow, subsequent encounter Z79.01 Long term (current) use of anticoagulants Facility Procedures The patient participates with Medicare or their insurance follows the Medicare Facility Guidelines: CPT4 Code Description Modifier Quantity [id number] [place] VISIT-LEV 3 EST PT 1 The patient participates with Medicare or their insurance follows the Medicare Facility Guidelines: [id number] (Facility Use Only) [id number] - [place] RT LEG 1 Physician Procedures CPT4 Code: [id number] Description: 99214 - WC PHYS  LEVEL 4 - EST PT Modifier: Quantity: 1 CPT4 Code: Description: ICD-10 Diagnosis Description E11.621 Type 2 diabetes mellitus with foot ulcer I87.2 Venous insufficiency (chronic) (peripheral) L97.812 Non-pressure chronic ulcer of other part of right lower leg with fat la S50.811A Abrasion of right  forearm, initial encounter Modifier: yer exposed Quantity: Electronic Signature(s) Signed: 10/31/2020 8:27:49 AM By: Worthy Keeler PA-C Previous Signature: 10/31/2020 7:33:21 AM Version By: Gretta Cool, BSN, RN, CWS, Kim RN, BSN Entered By: Worthy Keeler on 10/31/2020 08:27:49

## 2020-10-30 NOTE — Progress Notes (Addendum)
XITLALIC, MASLIN ([id number]) Visit Report for 10/30/2020 Allergy List Details Patient Name: [name], [name] Date of Service: 10/30/2020 2:45 PM Medical Record Number: [id number] Patient Account Number: [id number] Date of Birth/Sex: [birth year] (70 y.o. F) Treating RN: Cornell Barman Primary Care Kristain Hu: [name], Olivia Mackie Other Clinician: Referring Raffaele Derise: [name], Olivia Mackie Treating Maleiah Dula/Extender: Jeri Cos Weeks in Treatment: 17 Allergies Active Allergies Penicillins Reaction: hives Severity: Severe Active: 01/23/2013 Terramycin Reaction: hives Severity: Severe Active: 01/23/2013 aspirin Reaction: hives Active: 01/23/2013 Allergy Notes Electronic Signature(s) Signed: 10/31/2020 4:34:12 PM By: Gretta Cool, BSN, RN, CWS, Kim RN, BSN Entered By: Gretta Cool, BSN, RN, CWS, Kim on 10/30/2020 15:40:40 [name], [name] ([id number]) -------------------------------------------------------------------------------- Arrival Information Details Patient Name: [name] Date of Service: 10/30/2020 2:45 PM Medical Record Number: [id number] Patient Account Number: [id number] Date of Birth/Sex: 02-06-1951 (70 y.o. F) Treating RN: Cornell Barman Primary Care Patrik Turnbaugh: [name], Olivia Mackie Other Clinician: Referring Marlies Ligman: [name], Olivia Mackie Treating Saman Umstead/Extender: Skipper Cliche in Treatment: 22 Visit Information Patient Arrived: Wheel Chair Arrival Time: 15:11 Accompanied By: husband Transfer Assistance: Manual Patient Identification Verified: Yes Secondary Verification Process Completed: Yes Patient Has Alerts: Yes Patient Alerts: Patient on Blood Thinner Eliquis Diabetic II 06/2020 ABI L)1.22 R)1.14 History Since Last Visit Added or deleted any medications: No Electronic Signature(s) Signed: 10/31/2020 4:34:12 PM By: Gretta Cool, BSN, RN, CWS, Kim RN, BSN Entered By: Gretta Cool, BSN, RN, CWS, Kim on 10/30/2020 15:23:14 [name]  ([id number]) -------------------------------------------------------------------------------- Clinic Level of Care Assessment Details Patient Name: [name] Date of Service: 10/30/2020 2:45 PM Medical Record Number: [id number] Patient Account Number: [id number] Date of Birth/Sex: [birth year] (70 y.o. F) Treating RN: Cornell Barman Primary Care Nikiyah Fackler: [name], Olivia Mackie Other Clinician: Referring Adrain Nesbit: [name], Olivia Mackie Treating Johnpatrick Jenny/Extender: Skipper Cliche in Treatment: 17 Clinic Level of Care Assessment Items TOOL 1 Quantity Score _0  - Use when EandM and Procedure is performed on INITIAL visit 0 ASSESSMENTS - Nursing Assessment / Reassessment X - General Physical Exam (combine w/ comprehensive assessment (listed just below) when performed on new 1 20 pt. evals) X- 1 25 Comprehensive Assessment (HX, ROS, Risk Assessments, Wounds Hx, etc.) ASSESSMENTS - Wound and Skin Assessment / Reassessment _1  - Dermatologic / Skin Assessment (not related to wound area) 0 ASSESSMENTS - Ostomy and/or Continence Assessment and Care _2  - Incontinence Assessment and Management 0 _3  - 0 Ostomy Care Assessment and Management (repouching, etc.) PROCESS - Coordination of Care X - Simple Patient / Family Education for ongoing care 1 15 _4  - 0 Complex (extensive) Patient / Family Education for ongoing care X- 1 10 Staff obtains Programmer, systems, Records, Test Results / Process Orders _5  - 0 Staff telephones HHA, Nursing Homes / Clarify orders / etc _6  - 0 Routine Transfer to another Facility (non-emergent condition) _7  - 0 Routine Hospital Admission (non-emergent condition) X- 1 15 New Admissions / Biomedical engineer / Ordering NPWT, Apligraf, etc. _8  - 0 Emergency Hospital Admission (emergent condition) PROCESS - Special Needs _9  - Pediatric / Minor Patient Management 0 _10  - 0 Isolation Patient Management _11  - 0 Hearing / Language / Visual special needs _12  -  0 Assessment of Community assistance (transportation, D/C planning, etc.) _13  - 0 Additional assistance / Altered mentation _14  - 0 Support Surface(s) Assessment (bed, cushion, seat, etc.) INTERVENTIONS - Miscellaneous _15  - External ear exam 0 _16  - 0 Patient Transfer (multiple staff / Civil Service fast streamer / Similar devices) _17  - 0 Simple Staple / Suture removal (25 or less) _18  - 0 Complex Staple / Suture removal (26  or more) _0  - 0 Hypo/Hyperglycemic Management (do not check if billed separately) _1  - 0 Ankle / Brachial Index (ABI) - do not check if billed separately Has the patient been seen at the hospital within the last three years: Yes Total Score: 85 Level Of Care: New/Established - Level 3 [name], [name] ([id number]) Electronic Signature(s) Signed: 10/31/2020 4:34:12 PM By: Gretta Cool, BSN, RN, CWS, Kim RN, BSN Entered By: Gretta Cool, BSN, RN, CWS, Kim on 10/30/2020 16:37:49 [name] ([id number]) -------------------------------------------------------------------------------- Compression Therapy Details Patient Name: [name] Date of Service: 10/30/2020 2:45 PM Medical Record Number: [id number] Patient Account Number: [id number] Date of Birth/Sex: [birth year] (70 y.o. F) Treating RN: Cornell Barman Primary Care Rudolph Dobler: [name], Olivia Mackie Other Clinician: Referring Yamaris Cummings: [name], Olivia Mackie Treating Stormi Vandevelde/Extender: Skipper Cliche in Treatment: 17 Compression Therapy Performed for Wound Assessment: Wound #13 Right,Midline Lower Leg Performed By: Clinician Cornell Barman, RN Compression Type: Three Layer Pre Treatment ABI: 1.2 Post Procedure Diagnosis Same as Pre-procedure Electronic Signature(s) Signed: 10/31/2020 4:34:12 PM By: Gretta Cool, BSN, RN, CWS, Kim RN, BSN Entered By: Gretta Cool, BSN, RN, CWS, Kim on 10/30/2020 16:24:22 [name], [name] ([id number]) -------------------------------------------------------------------------------- Encounter Discharge Information  Details Patient Name: [name] Date of Service: 10/30/2020 2:45 PM Medical Record Number: [id number] Patient Account Number: [id number] Date of Birth/Sex: [birth year] (70 y.o. F) Treating RN: Cornell Barman Primary Care Esai Stecklein: [name], Olivia Mackie Other Clinician: Referring Kaylum Shrum: [name], Olivia Mackie Treating Sephira Zellman/Extender: Skipper Cliche in Treatment: 17 Encounter Discharge Information Items Discharge Condition: Stable Ambulatory Status: Wheelchair Discharge Destination: Home Transportation: Private Auto Accompanied By: husband Schedule Follow-up Appointment: Yes Clinical Summary of Care: Electronic Signature(s) Signed: 10/31/2020 4:34:12 PM By: Gretta Cool, BSN, RN, CWS, Kim RN, BSN Entered By: Gretta Cool, BSN, RN, CWS, Kim on 10/30/2020 16:39:36 [name] ([id number]) -------------------------------------------------------------------------------- Lower Extremity Assessment Details Patient Name: [name] Date of Service: 10/30/2020 2:45 PM Medical Record Number: [id number] Patient Account Number: [id number] Date of Birth/Sex: [birth year] (70 y.o. F) Treating RN: Cornell Barman Primary Care Barb Shear: [name], Olivia Mackie Other Clinician: Referring Charlene Detter: [name], Olivia Mackie Treating Ranger Petrich/Extender: Skipper Cliche in Treatment: 17 Edema Assessment Assessed: [Left: No] Patrice Paradise: No] [Left: Edema] [Right: :] Calf Left: Right: Point of Measurement: 31 cm From Medial Instep 34 cm 33.5 cm Ankle Left: Right: Point of Measurement: 11 cm From Medial Instep 16 cm 17.5 cm Vascular Assessment Pulses: Dorsalis Pedis Palpable: [Left:Yes] [Right:Yes] Notes Left DP Pulse is stronger than right. Electronic Signature(s) Signed: 10/31/2020 4:34:12 PM By: Gretta Cool, BSN, RN, CWS, Kim RN, BSN Entered By: Gretta Cool, BSN, RN, CWS, Kim on 10/30/2020 15:40:15 [name], [name]  ([id number]) -------------------------------------------------------------------------------- Multi Wound Chart Details Patient Name: [name] Date of Service: 10/30/2020 2:45 PM Medical Record Number: [id number] Patient Account Number: [id number] Date of Birth/Sex: [birth year] (70 y.o. F) Treating RN: Cornell Barman Primary Care Krystie Leiter: [name], Olivia Mackie Other Clinician: Referring Laurieann Friddle: [name], Olivia Mackie Treating Aamari Strawderman/Extender: Skipper Cliche in Treatment: 17 Vital Signs Height(in): 65.5 Pulse(bpm): 19 Weight(lbs): 240 Blood Pressure(mmHg): 144/57 Body Mass Index(BMI): 39 Temperature(F): 98.3 Respiratory Rate(breaths/min): 20 Photos: [12:No Photos] Wound Location: Right Forearm Right, Midline Lower Leg Right, Lateral Elbow Wounding Event: Trauma Gradually Appeared Trauma Primary Etiology: Trauma, Other Venous Leg Ulcer Trauma, Other Comorbid History: Asthma, Pneumothorax, Asthma, Pneumothorax, Asthma, Pneumothorax, Arrhythmia, Hypertension, Type II Arrhythmia, Hypertension, Type II Arrhythmia, Hypertension, Type II Diabetes, Gout, Osteoarthritis, Diabetes, Gout, Osteoarthritis, Diabetes, Gout, Osteoarthritis, Neuropathy Neuropathy Neuropathy Date Acquired: 09/06/2020 10/21/2020 10/25/2020 Weeks of Treatment: 6 0 0 Wound Status: Healed - Epithelialized Open Open Measurements  L x W x D (cm) 0x0x0 3.5x1.7x0.1 0.9x0.5x0.1 Area (cm) : 0 4.673 0.353 Volume (cm) : 0 0.467 0.035 % Reduction in Area: 100.00% 0.00% 0.00% % Reduction in Volume: 100.00% 0.00% 0.00% Classification: Partial Thickness Full Thickness Without Exposed Partial Thickness Support Structures Exudate Amount: Medium Large Medium Exudate Type: Sanguinous Serous Serosanguineous Exudate Color: red amber red, brown Wound Margin: N/A Flat and Intact Flat and Intact Granulation Amount: Medium (34-66%) Large (67-100%) None Present (0%) Granulation Quality: Pink Pink N/A Necrotic Amount: Medium  (34-66%) None Present (0%) Large (67-100%) Exposed Structures: Fat Layer (Subcutaneous Tissue): Fat Layer (Subcutaneous Tissue): Fascia: No Yes Yes Fat Layer (Subcutaneous Tissue): Fascia: No Fascia: No No Tendon: No Tendon: No Tendon: No Muscle: No Muscle: No Muscle: No Joint: No Joint: No Joint: No Bone: No Bone: No Bone: No Epithelialization: N/A None None Wound Number: 15 N/A N/A Photos: N/A N/A [name], [name] ([id number]) Wound Location: Right, Medial Forearm N/A N/A Wounding Event: Skin Tear/Laceration N/A N/A Primary Etiology: Skin Tear N/A N/A Comorbid History: Asthma, Pneumothorax, N/A N/A Arrhythmia, Hypertension, Type II Diabetes, Gout, Osteoarthritis, Neuropathy Date Acquired: 10/25/2020 N/A N/A Weeks of Treatment: 0 N/A N/A Wound Status: Open N/A N/A Measurements L x W x D (cm) 0.5x0.4x0.1 N/A N/A Area (cm) : 0.157 N/A N/A Volume (cm) : 0.016 N/A N/A % Reduction in Area: N/A N/A N/A % Reduction in Volume: N/A N/A N/A Classification: Partial Thickness N/A N/A Exudate Amount: N/A N/A N/A Exudate Type: N/A N/A N/A Exudate Color: N/A N/A N/A Wound Margin: N/A N/A N/A Granulation Amount: Medium (34-66%) N/A N/A Granulation Quality: N/A N/A N/A Necrotic Amount: None Present (0%) N/A N/A Exposed Structures: N/A N/A N/A Epithelialization: N/A N/A N/A Treatment Notes Electronic Signature(s) Signed: 10/31/2020 4:34:12 PM By: Gretta Cool, BSN, RN, CWS, Kim RN, BSN Entered By: Gretta Cool, BSN, RN, CWS, Kim on 10/30/2020 16:23:47 [name], [name] ([id number]) -------------------------------------------------------------------------------- Multi-Disciplinary Care Plan Details Patient Name: [name] Date of Service: 10/30/2020 2:45 PM Medical Record Number: [id number] Patient Account Number: [id number] Date of Birth/Sex: Sep 17, 1950 (70 y.o. F) Treating RN: Cornell Barman Primary Care Shiquan Mathieu: [name], Olivia Mackie Other Clinician: Referring Biannca Scantlin:  [name], Olivia Mackie Treating Jaidin Richison/Extender: Skipper Cliche in Treatment: 80 Active Inactive Abuse / Safety / Falls / Self Care Management Nursing Diagnoses: Potential for injury related to falls Goals: Patient will remain injury free related to falls Date Initiated: 06/28/2020 Target Resolution Date: 11/30/2020 Goal Status: Active Interventions: Assess Activities of Daily Living upon admission and as needed Assess fall risk on admission and as needed Assess: immobility, friction, shearing, incontinence upon admission and as needed Assess impairment of mobility on admission and as needed per policy Assess personal safety and home safety (as indicated) on admission and as needed Assess self care needs on admission and as needed Notes: Nutrition Nursing Diagnoses: Impaired glucose control: actual or potential Goals: Patient/caregiver verbalizes understanding of need to maintain therapeutic glucose control per primary care physician Date Initiated: 06/28/2020 Target Resolution Date: 11/30/2020 Goal Status: Active Interventions: Assess HgA1c results as ordered upon admission and as needed Assess patient nutrition upon admission and as needed per policy Notes: Orientation to the Wound Care Program Nursing Diagnoses: Knowledge deficit related to the wound healing center program Goals: Patient/caregiver will verbalize understanding of the [place] Program Date Initiated: 10/30/2020 Target Resolution Date: 10/30/2020 Goal Status: Active Interventions: Provide education on orientation to the wound center Notes: Venous Leg Ulcer Nursing Diagnoses: [name], Dawn B. ([id number]) Potential for venous Insuffiency (use before  diagnosis confirmed) Goals: Patient will maintain optimal edema control Date Initiated: 10/30/2020 Target Resolution Date: 11/21/2020 Goal Status: Active Patient/caregiver will verbalize understanding of disease process and disease management Date  Initiated: 10/30/2020 Target Resolution Date: 11/21/2020 Goal Status: Active Verify adequate tissue perfusion prior to therapeutic compression application Date Initiated: 10/30/2020 Target Resolution Date: 11/21/2020 Goal Status: Active Interventions: Assess peripheral edema status every visit. Compression as ordered Provide education on venous insufficiency Treatment Activities: Therapeutic compression applied : 10/30/2020 Notes: Wound/Skin Impairment Nursing Diagnoses: Knowledge deficit related to ulceration/compromised skin integrity Goals: Patient/caregiver will verbalize understanding of skin care regimen Date Initiated: 06/28/2020 Date Inactivated: 08/16/2020 Target Resolution Date: 07/28/2020 Goal Status: Met Ulcer/skin breakdown will have a volume reduction of 30% by week 4 Date Initiated: 06/28/2020 Date Inactivated: 08/16/2020 Target Resolution Date: 07/28/2020 Goal Status: Met Ulcer/skin breakdown will have a volume reduction of 50% by week 8 Date Initiated: 06/28/2020 Date Inactivated: 09/13/2020 Target Resolution Date: 08/28/2020 Goal Status: Met Ulcer/skin breakdown will have a volume reduction of 80% by week 12 Date Initiated: 06/28/2020 Target Resolution Date: 09/27/2020 Goal Status: Active Ulcer/skin breakdown will heal within 14 weeks Date Initiated: 06/28/2020 Target Resolution Date: 10/28/2020 Goal Status: Active Interventions: Assess patient/caregiver ability to obtain necessary supplies Assess patient/caregiver ability to perform ulcer/skin care regimen upon admission and as needed Assess ulceration(s) every visit Notes: Electronic Signature(s) Signed: 10/31/2020 4:34:12 PM By: Gretta Cool, BSN, RN, CWS, Kim RN, BSN Entered By: Gretta Cool, BSN, RN, CWS, Kim on 10/30/2020 16:23:29 [name], [name] ([id number]) -------------------------------------------------------------------------------- Non-Wound Condition Assessment Details Patient Name: [name] Date of Service:  10/30/2020 2:45 PM Medical Record Number: [id number] Patient Account Number: [id number] Date of Birth/Sex: 1950-08-27 (70 y.o. F) Treating RN: Cornell Barman Primary Care Natania Finigan: [name], Olivia Mackie Other Clinician: Referring Jozelynn Danielson: [name], Olivia Mackie Treating Damario Gillie/Extender: Skipper Cliche in Treatment: 17 Non-Wound Condition: Condition: Suspected Deep Tissue Injury Location: Foot Side: Left Notes 4th toe is discolored. Patient unaware of cause. Electronic Signature(s) Signed: 10/31/2020 4:34:12 PM By: Gretta Cool, BSN, RN, CWS, Kim RN, BSN Entered By: Gretta Cool, BSN, RN, CWS, Kim on 10/30/2020 15:37:20 [name], [name] ([id number]) -------------------------------------------------------------------------------- Pain Assessment Details Patient Name: [name] Date of Service: 10/30/2020 2:45 PM Medical Record Number: [id number] Patient Account Number: [id number] Date of Birth/Sex: 18-Aug-1950 (70 y.o. F) Treating RN: Cornell Barman Primary Care Chisa Kushner: [name], Olivia Mackie Other Clinician: Referring Kavan Devan: [name], Olivia Mackie Treating Yari Szeliga/Extender: Skipper Cliche in Treatment: 17 Active Problems Location of Pain Severity and Description of Pain Patient Has Paino Yes Site Locations Pain Location: Generalized Pain Pain Management and Medication Current Pain Management: Notes Patient has pain all over. Electronic Signature(s) Signed: 10/31/2020 4:34:12 PM By: Gretta Cool, BSN, RN, CWS, Kim RN, BSN Entered By: Gretta Cool, BSN, RN, CWS, Kim on 10/30/2020 15:23:37 HEATHERLY, STENNER ([id number]) -------------------------------------------------------------------------------- Patient/Caregiver Education Details Patient Name: [name] Date of Service: 10/30/2020 2:45 PM Medical Record Number: [id number] Patient Account Number: [id number] Date of Birth/Gender: 17-Feb-1951 (70 y.o. F) Treating RN: Cornell Barman Primary Care Physician: [name], Olivia Mackie Other  Clinician: Referring Physician: [name], Olivia Mackie Treating Physician/Extender: Skipper Cliche in Treatment: 30 Education Assessment Education Provided To: Patient Education Topics Provided Venous: Handouts: Controlling Swelling with Multilayered Compression Wraps Methods: Demonstration, Explain/Verbal Responses: State content correctly Welcome To [facility]: Handouts: Welcome To [facility] Methods: Demonstration, Explain/Verbal Responses: State content correctly Electronic Signature(s) Signed: 10/31/2020 4:34:12 PM By: Gretta Cool, BSN, RN, CWS, Kim RN, BSN Entered By: Gretta Cool, BSN, RN, CWS, Kim on 10/30/2020 16:38:36 Zhen, Friendship (  [id number]) -------------------------------------------------------------------------------- Wound Assessment Details Patient Name: ELFRIEDE, BONINI Date of Service: 10/30/2020 2:45 PM Medical Record Number: [id number] Patient Account Number: [id number] Date of Birth/Sex: [birth year] (70 y.o. F) Treating RN: Cornell Barman Primary Care Solace Wendorff: [name], Olivia Mackie Other Clinician: Referring Tanis Hensarling: [name], Olivia Mackie Treating Terrina Docter/Extender: Skipper Cliche in Treatment: 17 Wound Status Wound Number: 12 Primary Trauma, Other Etiology: Wound Location: Right Forearm Wound Healed - Epithelialized Wounding Event: Trauma Status: Date Acquired: 09/06/2020 Comorbid Asthma, Pneumothorax, Arrhythmia, Hypertension, Type Weeks Of Treatment: 6 History: II Diabetes, Gout, Osteoarthritis, Neuropathy Clustered Wound: No Wound Measurements Length: (cm) 0 Width: (cm) 0 Depth: (cm) 0 Area: (cm) 0 Volume: (cm) 0 % Reduction in Area: 100% % Reduction in Volume: 100% Wound Description Classification: Partial Thickness Exudate Amount: Medium Exudate Type: Sanguinous Exudate Color: red Foul Odor After Cleansing: No Slough/Fibrino Yes Wound Bed Granulation Amount: Medium (34-66%) Exposed Structure Granulation Quality:  Pink Fascia Exposed: No Necrotic Amount: Medium (34-66%) Fat Layer (Subcutaneous Tissue) Exposed: Yes Necrotic Quality: Adherent Slough Tendon Exposed: No Muscle Exposed: No Joint Exposed: No Bone Exposed: No Electronic Signature(s) Signed: 10/30/2020 3:54:39 PM By: Gretta Cool, BSN, RN, CWS, Kim RN, BSN Entered By: Gretta Cool, BSN, RN, CWS, Kim on 10/30/2020 15:54:39 Blasing, [name] ([id number]) -------------------------------------------------------------------------------- Wound Assessment Details Patient Name: [name] Date of Service: 10/30/2020 2:45 PM Medical Record Number: [id number] Patient Account Number: [id number] Date of Birth/Sex: 1950/05/27 (70 y.o. F) Treating RN: Cornell Barman Primary Care Imogene Gravelle: [name], Olivia Mackie Other Clinician: Referring Darien Kading: [name], Olivia Mackie Treating Ilai Hiller/Extender: Skipper Cliche in Treatment: 17 Wound Status Wound Number: 13 Primary Venous Leg Ulcer Etiology: Wound Location: Right, Midline Lower Leg Wound Open Wounding Event: Gradually Appeared Status: Date Acquired: 10/21/2020 Comorbid Asthma, Pneumothorax, Arrhythmia, Hypertension, Type Weeks Of Treatment: 0 History: II Diabetes, Gout, Osteoarthritis, Neuropathy Clustered Wound: No Photos Photo Uploaded By: Gretta Cool, BSN, RN, CWS, Kim on 10/30/2020 15:52:46 Wound Measurements Length: (cm) 3.5 Width: (cm) 1.7 Depth: (cm) 0.1 Area: (cm) 4.673 Volume: (cm) 0.467 % Reduction in Area: 0% % Reduction in Volume: 0% Epithelialization: None Tunneling: No Undermining: No Wound Description Classification: Full Thickness Without Exposed Support Structu Wound Margin: Flat and Intact Exudate Amount: Large Exudate Type: Serous Exudate Color: amber res Foul Odor After Cleansing: No Slough/Fibrino Yes Wound Bed Granulation Amount: Large (67-100%) Exposed Structure Granulation Quality: Pink Fascia Exposed: No Necrotic Amount: None Present (0%) Fat Layer (Subcutaneous  Tissue) Exposed: Yes Tendon Exposed: No Muscle Exposed: No Joint Exposed: No Bone Exposed: No Treatment Notes Wound #13 (Lower Leg) Wound Laterality: Right, Midline Cleanser Peri-Wound Care Topical Taft, Brizza B. ([id number]) Primary Dressing Silvercel Small 2x2 (in/in) Discharge Instruction: Apply Silvercel Small 2x2 (in/in) as instructed Secondary Dressing ABD Pad 5x9 (in/in) Discharge Instruction: Cover with ABD pad Secured With Compression Wrap Profore Lite LF 3 Multilayer Compression Bandaging System Discharge Instruction: Apply 3 multi-layer wrap as prescribed. Compression Stockings Environmental education officer) Signed: 10/31/2020 4:34:12 PM By: Gretta Cool, BSN, RN, CWS, Kim RN, BSN Entered By: Gretta Cool, BSN, RN, CWS, Kim on 10/30/2020 15:34:44 KHALIL, BELOTE ([id number]) -------------------------------------------------------------------------------- Wound Assessment Details Patient Name: [name] Date of Service: 10/30/2020 2:45 PM Medical Record Number: [id number] Patient Account Number: [id number] Date of Birth/Sex: 09-14-1950 (70 y.o. F) Treating RN: Cornell Barman Primary Care Wymon Swaney: [name], Olivia Mackie Other Clinician: Referring Raeana Blinn: [name], Olivia Mackie Treating Amiere Cawley/Extender: Skipper Cliche in Treatment: 17 Wound Status Wound Number: 14 Primary Trauma, Other Etiology: Wound Location: Right, Lateral Elbow Wound Open Wounding Event: Trauma Status: Date Acquired: 10/25/2020 Comorbid Asthma,  Pneumothorax, Arrhythmia, Hypertension, Type Weeks Of Treatment: 0 History: II Diabetes, Gout, Osteoarthritis, Neuropathy Clustered Wound: No Photos Photo Uploaded By: Gretta Cool, BSN, RN, CWS, Kim on 10/30/2020 15:53:17 Wound Measurements Length: (cm) 0.9 Width: (cm) 0.5 Depth: (cm) 0.1 Area: (cm) 0.353 Volume: (cm) 0.035 % Reduction in Area: 0% % Reduction in Volume: 0% Epithelialization: None Tunneling: No Undermining: No Wound  Description Classification: Partial Thickness Wound Margin: Flat and Intact Exudate Amount: Medium Exudate Type: Serosanguineous Exudate Color: red, brown Foul Odor After Cleansing: No Slough/Fibrino Yes Wound Bed Granulation Amount: None Present (0%) Exposed Structure Necrotic Amount: Large (67-100%) Fascia Exposed: No Necrotic Quality: Adherent Slough Fat Layer (Subcutaneous Tissue) Exposed: No Tendon Exposed: No Muscle Exposed: No Joint Exposed: No Bone Exposed: No Treatment Notes Wound #14 (Elbow) Wound Laterality: Right, Lateral Cleanser Peri-Wound Care Topical EVIANNA, CHANDRAN ([id number]) Primary Dressing Secondary Dressing Secured With Compression Wrap Compression Stockings Add-Ons Electronic Signature(s) Signed: 10/31/2020 4:34:12 PM By: Gretta Cool, BSN, RN, CWS, Kim RN, BSN Entered By: Gretta Cool, BSN, RN, CWS, Kim on 10/30/2020 15:33:37 [place], [name] ([id number]) -------------------------------------------------------------------------------- Wound Assessment Details Patient Name: [name] Date of Service: 10/30/2020 2:45 PM Medical Record Number: [id number] Patient Account Number: [id number] Date of Birth/Sex: June 26, 1950 (70 y.o. F) Treating RN: Cornell Barman Primary Care Cristiano Capri: [name], Olivia Mackie Other Clinician: Referring Nareg Breighner: [name], Olivia Mackie Treating Armanii Pressnell/Extender: Skipper Cliche in Treatment: 17 Wound Status Wound Number: 15 Primary Skin Tear Etiology: Wound Location: Right, Medial Forearm Wound Open Wounding Event: Skin Tear/Laceration Status: Date Acquired: 10/25/2020 Comorbid Asthma, Pneumothorax, Arrhythmia, Hypertension, Type Weeks Of Treatment: 0 History: II Diabetes, Gout, Osteoarthritis, Neuropathy Clustered Wound: No Photos Photo Uploaded By: Gretta Cool, BSN, RN, CWS, Kim on 10/30/2020 15:53:18 Wound Measurements Length: (cm) Width: (cm) Depth: (cm) Area: (cm) Volume: (cm) 0.5 % Reduction in Area: 0.4 %  Reduction in Volume: 0.1 0.157 0.016 Wound Description Classification: Partial Thickness Wound Bed Granulation Amount: Medium (34-66%) Necrotic Amount: None Present (0%) Treatment Notes Wound #15 (Forearm) Wound Laterality: Right, Medial Cleanser Peri-Wound Care Topical Primary Dressing Secondary Dressing Secured With Compression Wrap Compression Stockings KAHLYN, SHIPPEY ([id number]) Add-Ons Electronic Signature(s) Signed: 10/31/2020 4:34:12 PM By: Gretta Cool, BSN, RN, CWS, Kim RN, BSN Entered By: Gretta Cool, BSN, RN, CWS, Kim on 10/30/2020 15:32:50 CERENA, BAINE ([id number]) -------------------------------------------------------------------------------- Vitals Details Patient Name: [name] Date of Service: 10/30/2020 2:45 PM Medical Record Number: [id number] Patient Account Number: [id number] Date of Birth/Sex: 27-Apr-1950 (70 y.o. F) Treating RN: Cornell Barman Primary Care Omario Ander: [name], Olivia Mackie Other Clinician: Referring Leilani Cespedes: [name], Olivia Mackie Treating Roseline Ebarb/Extender: Skipper Cliche in Treatment: 17 Vital Signs Time Taken: 15:23 Temperature (F): 98.3 Height (in): 65.5 Pulse (bpm): 47 Weight (lbs): 240 Respiratory Rate (breaths/min): 20 Body Mass Index (BMI): 39.3 Blood Pressure (mmHg): 144/57 Reference Range: 80 - 120 mg / dl Airway Pulse Oximetry (%): 91 Electronic Signature(s) Signed: 10/31/2020 4:34:12 PM By: Gretta Cool, BSN, RN, CWS, Kim RN, BSN Entered By: Gretta Cool, BSN, RN, CWS, Kim on 10/30/2020 15:24:04

## 2020-10-31 ENCOUNTER — Other Ambulatory Visit: Payer: Self-pay | Admitting: Physician Assistant

## 2020-10-31 ENCOUNTER — Ambulatory Visit
Admission: RE | Admit: 2020-10-31 | Discharge: 2020-10-31 | Disposition: A | Discharge status: Home / Self Care | Payer: Medicare HMO | Attending: Internal Medicine | Admitting: Internal Medicine

## 2020-10-31 ENCOUNTER — Ambulatory Visit
Admission: RE | Admit: 2020-10-31 | Discharge: 2020-10-31 | Disposition: A | Discharge status: Home / Self Care | Payer: Medicare HMO | Source: Ambulatory Visit | Attending: Internal Medicine | Admitting: Internal Medicine

## 2020-10-31 ENCOUNTER — Ambulatory Visit
Admission: RE | Admit: 2020-10-31 | Discharge: 2020-10-31 | Disposition: A | Discharge status: Home / Self Care | Payer: Medicare HMO | Attending: Physician Assistant | Admitting: Physician Assistant

## 2020-10-31 DIAGNOSIS — Z96652 Presence of left artificial knee joint: Secondary | ICD-10-CM | POA: Diagnosis not present

## 2020-10-31 DIAGNOSIS — R609 Edema, unspecified: Secondary | ICD-10-CM | POA: Insufficient documentation

## 2020-10-31 DIAGNOSIS — M25562 Pain in left knee: Secondary | ICD-10-CM

## 2020-10-31 DIAGNOSIS — M79675 Pain in left toe(s): Secondary | ICD-10-CM | POA: Diagnosis not present

## 2020-10-31 DIAGNOSIS — M25512 Pain in left shoulder: Secondary | ICD-10-CM | POA: Diagnosis not present

## 2020-10-31 DIAGNOSIS — I4819 Other persistent atrial fibrillation: Secondary | ICD-10-CM | POA: Diagnosis not present

## 2020-10-31 DIAGNOSIS — G8194 Hemiplegia, unspecified affecting left nondominant side: Secondary | ICD-10-CM | POA: Diagnosis not present

## 2020-10-31 DIAGNOSIS — E11621 Type 2 diabetes mellitus with foot ulcer: Secondary | ICD-10-CM | POA: Diagnosis not present

## 2020-10-31 DIAGNOSIS — I152 Hypertension secondary to endocrine disorders: Secondary | ICD-10-CM | POA: Diagnosis not present

## 2020-10-31 DIAGNOSIS — M25462 Effusion, left knee: Secondary | ICD-10-CM | POA: Insufficient documentation

## 2020-10-31 DIAGNOSIS — R062 Wheezing: Secondary | ICD-10-CM | POA: Diagnosis not present

## 2020-10-31 DIAGNOSIS — J9801 Acute bronchospasm: Secondary | ICD-10-CM | POA: Insufficient documentation

## 2020-10-31 DIAGNOSIS — G8929 Other chronic pain: Secondary | ICD-10-CM | POA: Diagnosis not present

## 2020-10-31 DIAGNOSIS — M25412 Effusion, left shoulder: Secondary | ICD-10-CM | POA: Diagnosis not present

## 2020-10-31 DIAGNOSIS — W010XXD Fall on same level from slipping, tripping and stumbling without subsequent striking against object, subsequent encounter: Secondary | ICD-10-CM | POA: Diagnosis not present

## 2020-10-31 DIAGNOSIS — I872 Venous insufficiency (chronic) (peripheral): Secondary | ICD-10-CM | POA: Diagnosis not present

## 2020-10-31 DIAGNOSIS — M7542 Impingement syndrome of left shoulder: Secondary | ICD-10-CM | POA: Diagnosis not present

## 2020-10-31 DIAGNOSIS — I69319 Unspecified symptoms and signs involving cognitive functions following cerebral infarction: Secondary | ICD-10-CM | POA: Diagnosis not present

## 2020-10-31 DIAGNOSIS — M7582 Other shoulder lesions, left shoulder: Secondary | ICD-10-CM | POA: Diagnosis not present

## 2020-10-31 DIAGNOSIS — M7552 Bursitis of left shoulder: Secondary | ICD-10-CM | POA: Diagnosis not present

## 2020-10-31 DIAGNOSIS — E1151 Type 2 diabetes mellitus with diabetic peripheral angiopathy without gangrene: Secondary | ICD-10-CM | POA: Diagnosis not present

## 2020-10-31 DIAGNOSIS — M19012 Primary osteoarthritis, left shoulder: Secondary | ICD-10-CM | POA: Diagnosis not present

## 2020-10-31 DIAGNOSIS — E1159 Type 2 diabetes mellitus with other circulatory complications: Secondary | ICD-10-CM | POA: Diagnosis not present

## 2020-10-31 DIAGNOSIS — E1142 Type 2 diabetes mellitus with diabetic polyneuropathy: Secondary | ICD-10-CM | POA: Diagnosis not present

## 2020-10-31 DIAGNOSIS — M62838 Other muscle spasm: Secondary | ICD-10-CM | POA: Diagnosis not present

## 2020-10-31 DIAGNOSIS — M7989 Other specified soft tissue disorders: Secondary | ICD-10-CM | POA: Diagnosis not present

## 2020-10-31 DIAGNOSIS — L97422 Non-pressure chronic ulcer of left heel and midfoot with fat layer exposed: Secondary | ICD-10-CM | POA: Diagnosis not present

## 2020-10-31 NOTE — Progress Notes (Signed)
[name] ([id number]) Visit Report for 10/30/2020 Abuse/Suicide Risk Screen Details Patient Name: [name] Date of Service: 10/30/2020 2:45 PM Medical Record Number: [id number] Patient Account Number: [id number] Date of Birth/Sex: 10/21/1950 (70 y.o. F) Treating RN: Cornell Barman Primary Care Makeba Delcastillo: [name], Olivia Mackie Other Clinician: Referring Mikaelyn Arthurs: [name], Olivia Mackie Treating Raidyn Breiner/Extender: Skipper Cliche in Treatment: 17 Abuse/Suicide Risk Screen Items Answer ABUSE RISK SCREEN: Has anyone close to you tried to hurt or harm you recentlyo No Do you feel uncomfortable with anyone in your familyo No Has anyone forced you do things that you didnot want to doo No Electronic Signature(s) Signed: 10/31/2020 4:34:12 PM By: Gretta Cool, BSN, RN, CWS, Kim RN, BSN Entered By: Gretta Cool, BSN, RN, CWS, Kim on 10/30/2020 15:41:21 [name] ([id number]) -------------------------------------------------------------------------------- Activities of Daily Living Details Patient Name: [name] Date of Service: 10/30/2020 2:45 PM Medical Record Number: [id number] Patient Account Number: [id number] Date of Birth/Sex: 05/08/1950 (70 y.o. F) Treating RN: Cornell Barman Primary Care Markita Stcharles: [name], Olivia Mackie Other Clinician: Referring Charnae Lill: [name], Olivia Mackie Treating Alek Poncedeleon/Extender: Skipper Cliche in Treatment: 17 Activities of Daily Living Items Answer Activities of Daily Living (Please select one for each item) Drive Automobile Not Able Take Medications Completely Able Use Telephone Completely Able Care for Appearance Need Assistance Use Toilet Need Assistance Bath / Shower Need Assistance Dress Self Need Assistance Feed Self Completely Able Walk Not Able Get In / Out Bed Need Assistance Housework Need Assistance Prepare Meals Not [place] for Self Need Assistance Electronic Signature(s) Signed: 10/31/2020  4:34:12 PM By: Gretta Cool, BSN, RN, CWS, Kim RN, BSN Entered By: Gretta Cool, BSN, RN, CWS, Kim on 10/30/2020 15:42:06 [name] ([id number]) -------------------------------------------------------------------------------- Education Screening Details Patient Name: [name] Date of Service: 10/30/2020 2:45 PM Medical Record Number: [id number] Patient Account Number: [id number] Date of Birth/Sex: [birth year] (70 y.o. F) Treating RN: Cornell Barman Primary Care Dhyana Bastone: [name], Olivia Mackie Other Clinician: Referring Destin Vinsant: [name], Olivia Mackie Treating Jeremian Whitby/Extender: Skipper Cliche in Treatment: 17 Primary Learner Assessed: Patient Learning Preferences/Education Level/Primary Language Learning Preference: Explanation Highest Education Level: College or Above Preferred Language: English Cognitive Barrier Language Barrier: No Translator Needed: No Memory Deficit: No Emotional Barrier: No Physical Barrier Impaired Vision: No Impaired Hearing: No Decreased Hand dexterity: No Knowledge/Comprehension Knowledge Level: High Comprehension Level: High Ability to understand written instructions: High Ability to understand verbal instructions: High Motivation Anxiety Level: Calm Cooperation: Cooperative Education Importance: Acknowledges Need Interest in Health Problems: Asks Questions Perception: Coherent Willingness to Engage in Self-Management High Activities: Readiness to Engage in Self-Management High Activities: Electronic Signature(s) Signed: 10/31/2020 4:34:12 PM By: Gretta Cool, BSN, RN, CWS, Kim RN, BSN Entered By: Gretta Cool, BSN, RN, CWS, Kim on 10/30/2020 15:43:12 [name] ([id number]) -------------------------------------------------------------------------------- Fall Risk Assessment Details Patient Name: [name] Date of Service: 10/30/2020 2:45 PM Medical Record Number: [id number] Patient Account Number: [id number] Date of Birth/Sex: February 26, 1951  (70 y.o. F) Treating RN: Cornell Barman Primary Care Jadriel Saxer: [name], Olivia Mackie Other Clinician: Referring Jenika Chiem: [name], Olivia Mackie Treating Selim Durden/Extender: Skipper Cliche in Treatment: 17 Fall Risk Assessment Items Have you had 2 or more falls in the last 12 monthso 0 Yes Have you had any fall that resulted in injury in the last 12 monthso 0 No FALLS RISK SCREEN History of falling - immediate or within 3 months 25 Yes Secondary diagnosis (Do you have 2 or more medical diagnoseso) 0 No Ambulatory aid None/bed rest/wheelchair/nurse 0 No Crutches/cane/walker 15 Yes Furniture 0 No Intravenous therapy  Access/Saline/Heparin Lock 0 No Gait/Transferring Normal/ bed rest/ wheelchair 0 No Weak (short steps with or without shuffle, stooped but able to lift head while walking, may 0 No seek support from furniture) Impaired (short steps with shuffle, may have difficulty arising from chair, head down, impaired 20 Yes balance) Mental Status Oriented to own ability 0 Yes Electronic Signature(s) Signed: 10/31/2020 4:34:12 PM By: Gretta Cool, BSN, RN, CWS, Kim RN, BSN Entered By: Gretta Cool, BSN, RN, CWS, Kim on 10/30/2020 15:44:11 [name] ([id number]) -------------------------------------------------------------------------------- Foot Assessment Details Patient Name: [name] Date of Service: 10/30/2020 2:45 PM Medical Record Number: [id number] Patient Account Number: [id number] Date of Birth/Sex: Feb 04, 1951 (70 y.o. F) Treating RN: Cornell Barman Primary Care Shanecia Hoganson: [name], Olivia Mackie Other Clinician: Referring Yaneth Fairbairn: [name], Olivia Mackie Treating [place] Seaborn/Extender: Skipper Cliche in Treatment: 17 Foot Assessment Items Site Locations + = Sensation present, - = Sensation absent, C = Callus, U = Ulcer R = Redness, W = Warmth, M = Maceration, PU = Pre-ulcerative lesion F = Fissure, S = Swelling, D = Dryness Assessment Right: Left: Other Deformity: No  No Prior Foot Ulcer: No No Prior Amputation: No No Charcot Joint: No No Ambulatory Status: Ambulatory With Help Assistance Device: Walker Gait: Administrator, arts) Signed: 10/31/2020 4:34:12 PM By: Gretta Cool, BSN, RN, CWS, Kim RN, BSN Entered By: Gretta Cool, BSN, RN, CWS, Kim on 10/30/2020 15:45:54 [name] ([id number]) -------------------------------------------------------------------------------- Nutrition Risk Screening Details Patient Name: [name] Date of Service: 10/30/2020 2:45 PM Medical Record Number: [id number] Patient Account Number: [id number] Date of Birth/Sex: [birth year] (70 y.o. F) Treating RN: Cornell Barman Primary Care Domingue Coltrain: [name], Olivia Mackie Other Clinician: Referring Kacper Cartlidge: [name], Olivia Mackie Treating Calaya Gildner/Extender: Skipper Cliche in Treatment: 17 Height (in): 65.5 Weight (lbs): 240 Body Mass Index (BMI): 39.3 Nutrition Risk Screening Items Score Screening NUTRITION RISK SCREEN: I have an illness or condition that made me change the kind and/or amount of food I eat 0 No I eat fewer than two meals per day 0 No I eat few fruits and vegetables, or milk products 0 No I have three or more drinks of beer, liquor or wine almost every day 0 No I have tooth or mouth problems that make it hard for me to eat 0 No I don't always have enough money to buy the food I need 0 No I eat alone most of the time 0 No I take three or more different prescribed or over-the-counter drugs a day 1 Yes Without wanting to, I have lost or gained 10 pounds in the last six months 0 No I am not always physically able to shop, cook and/or feed myself 0 No Nutrition Protocols Good Risk Protocol Moderate Risk Protocol 0 Provide education on nutrition High Risk Proctocol Risk Level: Good Risk Score: 1 Electronic Signature(s) Signed: 10/31/2020 4:34:12 PM By: Gretta Cool, BSN, RN, CWS, Kim RN, BSN Entered By: Gretta Cool, BSN, RN, CWS, Kim on 10/30/2020 15:44:31

## 2020-11-02 NOTE — Telephone Encounter (Signed)
It is okay to give a verbal order for the chest x-ray.  She really should have an evaluation for this issue as well.  If she cannot leave the home she can be scheduled for a virtual visit to discuss further.

## 2020-11-04 ENCOUNTER — Ambulatory Visit: Payer: Medicare HMO | Admitting: Adult Health

## 2020-11-04 NOTE — Telephone Encounter (Signed)
Verbal ok given.

## 2020-11-05 IMAGING — RF ESOPHAGUS/BARIUM SWALLOW/TABLET STUDY
10 series · 13 of 13 positions shown · non-contrast
Comparison: CT chest 07/28/2018.

CLINICAL DATA: Food getting stuck in mid esophagus.

EXAM:
ESOPHOGRAM/BARIUM SWALLOW
TECHNIQUE: Single contrast examination was performed using  thin barium.
FLUOROSCOPY TIME:  Fluoroscopy Time:  1 minutes 0 seconds
Radiation Exposure Index (if provided by the fluoroscopic device):
25.8 mGy
Number of Acquired Spot Images: 16

[Series 1: fluoro_barium 2fps_bw · 0.18mm/px · 3 of 6 frames shown (1 of 10)]
[frame 1/6]
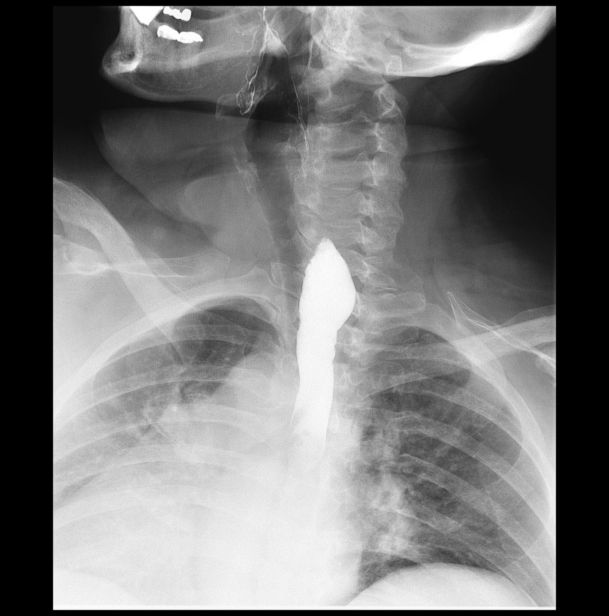
[frame 4/6]
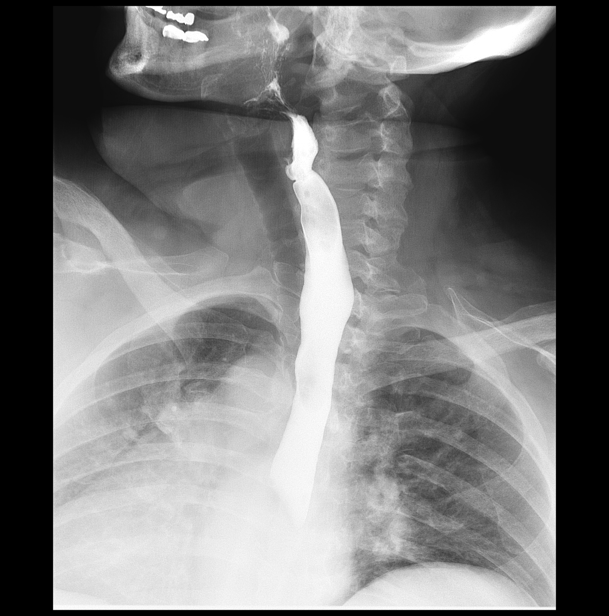
[frame 6/6]
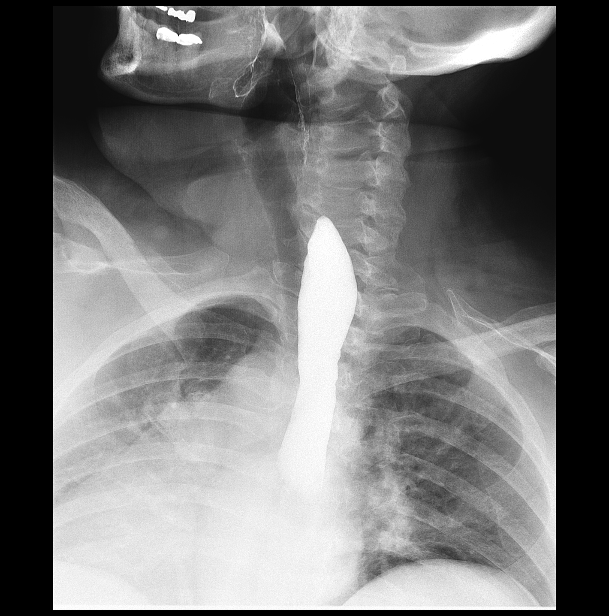

[Series 2: fluoro_barium 2fps_bw · 0.18mm/px · 1 of 1 slices shown (2 of 10)]
[im 1/1]
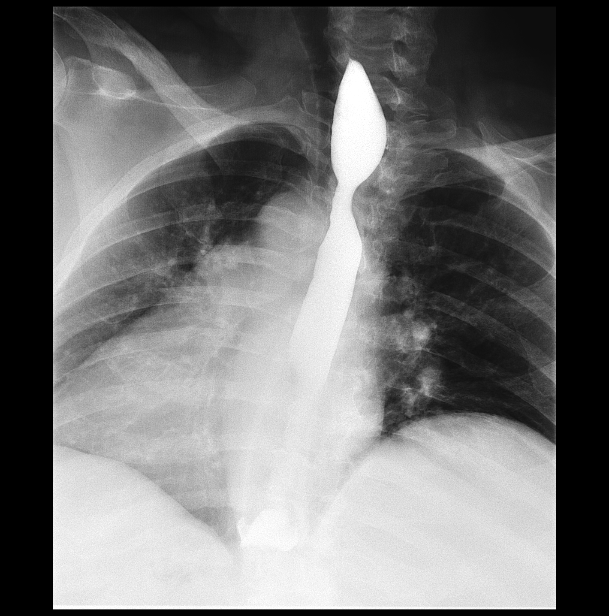

[Series 3: fluoro_barium 2fps_bw · 0.18mm/px · 1 of 1 slices shown (3 of 10)]
[im 1/1]
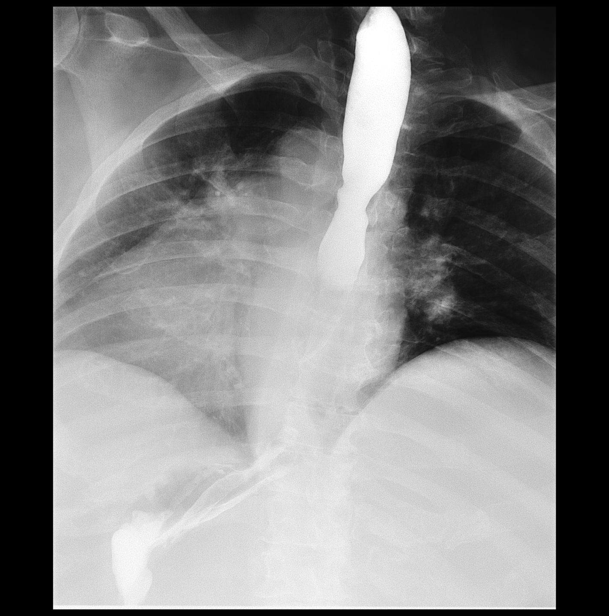

[Series 4: fluoro_barium 2fps_bw · 0.18mm/px · 1 of 1 slices shown (4 of 10)]
[im 1/1]
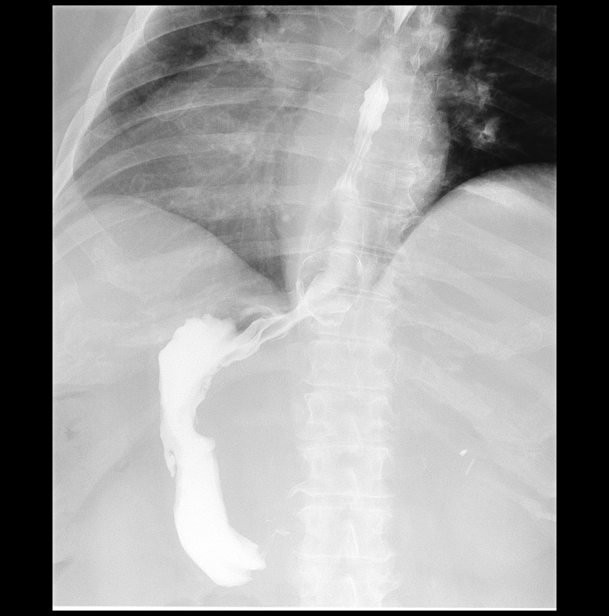

[Series 5: fluoro_barium 2fps_bw · 0.18mm/px · 1 of 1 slices shown (5 of 10)]
[im 1/1]
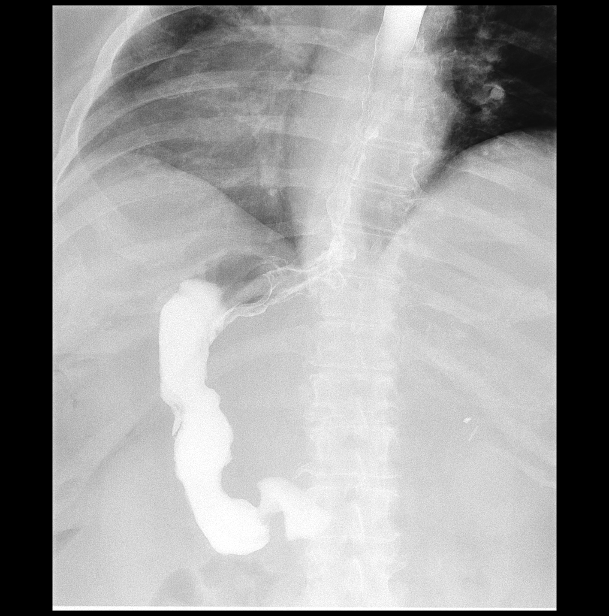

[Series 6: fluoro_barium 2fps_bw · 0.18mm/px · 1 of 1 slices shown (6 of 10)]
[im 1/1]
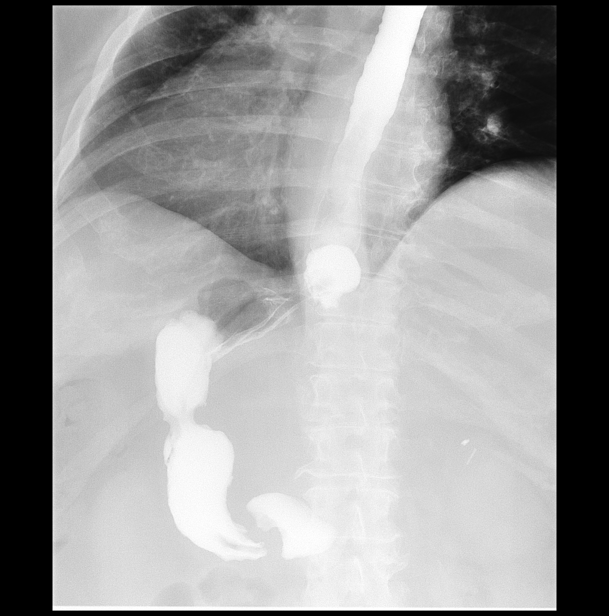

[Series 7: fluoro_barium 2fps_bw · 0.18mm/px · 1 of 1 slices shown (7 of 10)]
[im 1/1]
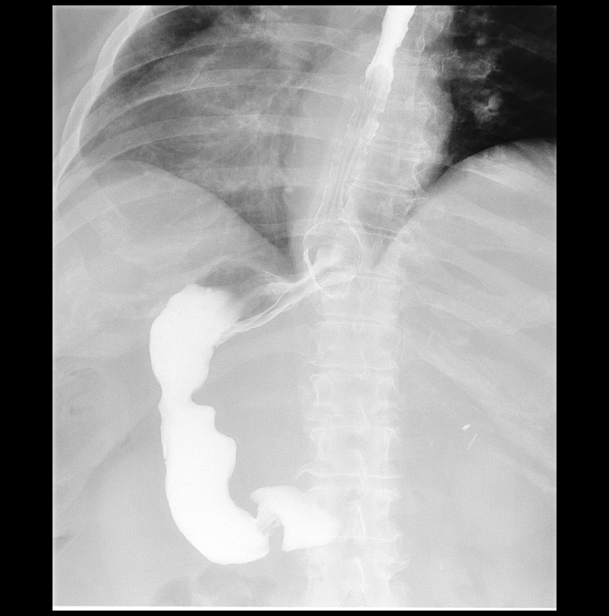

[Series 8: fluoro_barium 2fps_bw · 0.18mm/px · 1 of 1 slices shown (8 of 10)]
[im 1/1]
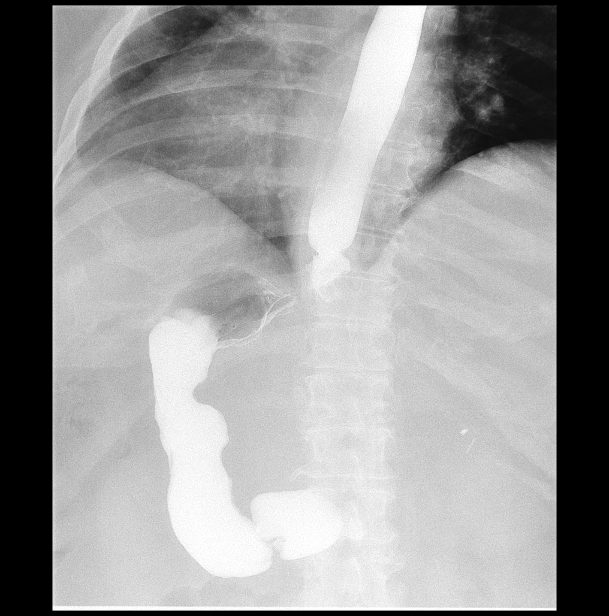

[Series 9: fluoro_barium 2fps_bw · 0.18mm/px · 2 of 2 frames shown (9 of 10)]
[frame 1/2]
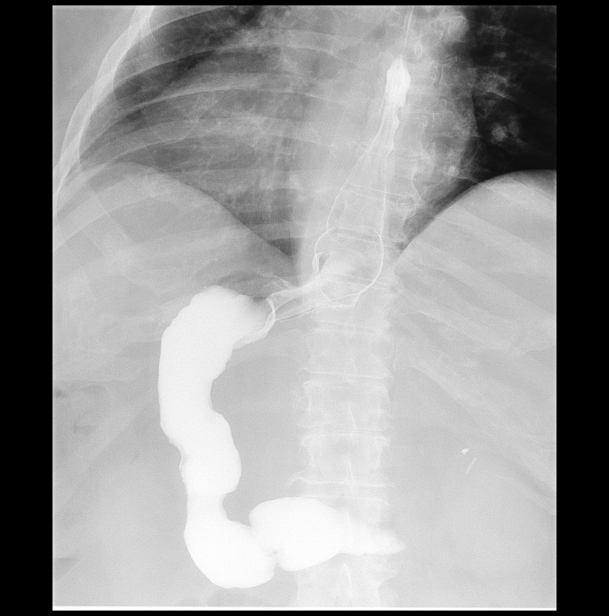
[frame 2/2]
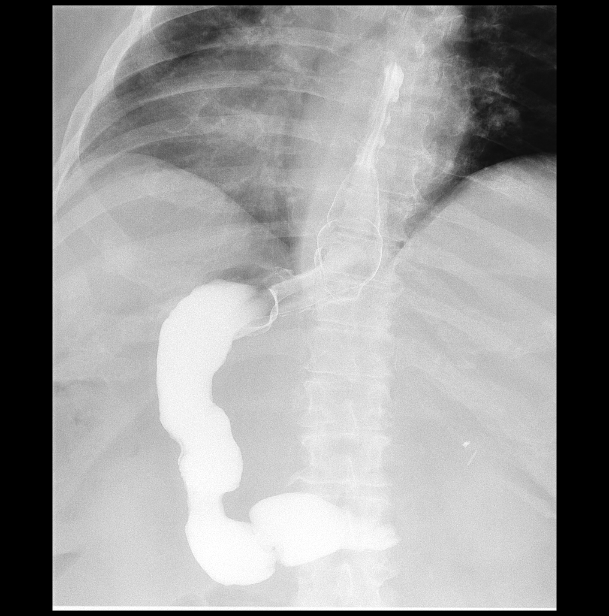

[Series 10: fluoro_barium 2fps_bw · 0.18mm/px · 1 of 1 slices shown (10 of 10)]
[im 1/1]
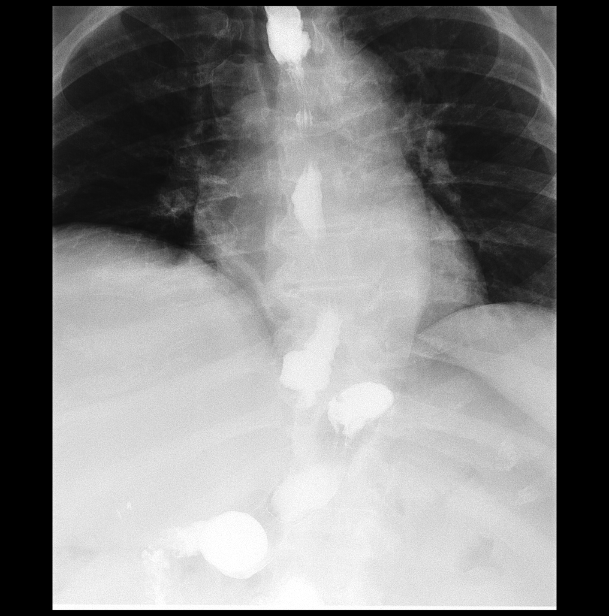

[13 of 13 positions shown; findings below may reference images not displayed]

FINDINGS: Limited exam due to patient's difficulty with mobility. Cervical and
thoracic esophagus are widely patent. Mild tertiary esophageal
contractions suggesting presbyesophagus. Small hiatal hernia. Mild
gastroesophageal reflux noted. Standardized barium tablet passed
without difficulty.
IMPRESSION: 1. Mild tertiary esophageal contractions consistent with
presbyesophagus.

2. Small sliding hiatal hernia. Mild gastroesophageal reflux.
Esophagus otherwise unremarkable. No obstructing abnormality.

## 2020-11-06 ENCOUNTER — Ambulatory Visit (INDEPENDENT_AMBULATORY_CARE_PROVIDER_SITE_OTHER): Payer: Medicare HMO | Admitting: Nurse Practitioner

## 2020-11-06 ENCOUNTER — Encounter (INDEPENDENT_AMBULATORY_CARE_PROVIDER_SITE_OTHER): Payer: Medicare HMO

## 2020-11-07 ENCOUNTER — Ambulatory Visit: Payer: Medicare HMO | Admitting: Physician Assistant

## 2020-11-07 DIAGNOSIS — E1142 Type 2 diabetes mellitus with diabetic polyneuropathy: Secondary | ICD-10-CM | POA: Diagnosis not present

## 2020-11-07 DIAGNOSIS — E1151 Type 2 diabetes mellitus with diabetic peripheral angiopathy without gangrene: Secondary | ICD-10-CM | POA: Diagnosis not present

## 2020-11-07 DIAGNOSIS — E11621 Type 2 diabetes mellitus with foot ulcer: Secondary | ICD-10-CM | POA: Diagnosis not present

## 2020-11-07 DIAGNOSIS — G8194 Hemiplegia, unspecified affecting left nondominant side: Secondary | ICD-10-CM | POA: Diagnosis not present

## 2020-11-07 DIAGNOSIS — I872 Venous insufficiency (chronic) (peripheral): Secondary | ICD-10-CM | POA: Diagnosis not present

## 2020-11-07 DIAGNOSIS — L97422 Non-pressure chronic ulcer of left heel and midfoot with fat layer exposed: Secondary | ICD-10-CM | POA: Diagnosis not present

## 2020-11-07 DIAGNOSIS — I4819 Other persistent atrial fibrillation: Secondary | ICD-10-CM | POA: Diagnosis not present

## 2020-11-07 DIAGNOSIS — I152 Hypertension secondary to endocrine disorders: Secondary | ICD-10-CM | POA: Diagnosis not present

## 2020-11-07 DIAGNOSIS — E1159 Type 2 diabetes mellitus with other circulatory complications: Secondary | ICD-10-CM | POA: Diagnosis not present

## 2020-11-07 DIAGNOSIS — I69319 Unspecified symptoms and signs involving cognitive functions following cerebral infarction: Secondary | ICD-10-CM | POA: Diagnosis not present

## 2020-11-09 DIAGNOSIS — G4733 Obstructive sleep apnea (adult) (pediatric): Secondary | ICD-10-CM | POA: Diagnosis not present

## 2020-11-11 ENCOUNTER — Telehealth: Payer: Self-pay | Admitting: Cardiovascular Disease

## 2020-11-11 DIAGNOSIS — I739 Peripheral vascular disease, unspecified: Secondary | ICD-10-CM | POA: Diagnosis not present

## 2020-11-11 DIAGNOSIS — L97521 Non-pressure chronic ulcer of other part of left foot limited to breakdown of skin: Secondary | ICD-10-CM | POA: Diagnosis not present

## 2020-11-11 DIAGNOSIS — L03032 Cellulitis of left toe: Secondary | ICD-10-CM | POA: Diagnosis not present

## 2020-11-11 DIAGNOSIS — L97511 Non-pressure chronic ulcer of other part of right foot limited to breakdown of skin: Secondary | ICD-10-CM | POA: Diagnosis not present

## 2020-11-11 DIAGNOSIS — L603 Nail dystrophy: Secondary | ICD-10-CM | POA: Diagnosis not present

## 2020-11-11 DIAGNOSIS — Z8673 Personal history of transient ischemic attack (TIA), and cerebral infarction without residual deficits: Secondary | ICD-10-CM | POA: Diagnosis not present

## 2020-11-11 DIAGNOSIS — S90822A Blister (nonthermal), left foot, initial encounter: Secondary | ICD-10-CM | POA: Diagnosis not present

## 2020-11-11 DIAGNOSIS — M79674 Pain in right toe(s): Secondary | ICD-10-CM | POA: Diagnosis not present

## 2020-11-11 DIAGNOSIS — E1142 Type 2 diabetes mellitus with diabetic polyneuropathy: Secondary | ICD-10-CM | POA: Diagnosis not present

## 2020-11-11 DIAGNOSIS — B351 Tinea unguium: Secondary | ICD-10-CM | POA: Diagnosis not present

## 2020-11-11 DIAGNOSIS — R2689 Other abnormalities of gait and mobility: Secondary | ICD-10-CM | POA: Diagnosis not present

## 2020-11-11 NOTE — Telephone Encounter (Signed)
   [facility] Pre-operative Risk Assessment    Patient Name: [name]  DOB: [birth year] MRN: [id number]  HEARTCARE STAFF:  - IMPORTANT!!!!!! Under Visit Info/Reason for Call, type in Other and utilize the format Clearance MM/DD/YY or Clearance TBD. Do not use dashes or single digits. - Please review there is not already an duplicate clearance open for this procedure. - If request is for dental extraction, please clarify the # of teeth to be extracted. - If the patient is currently at the dentist's office, call Pre-Op Callback Staff (MA/nurse) to input urgent request.  - If the patient is not currently in the dentist office, please route to the Pre-Op pool.  Request for surgical clearance:  What type of surgery is being performed? Crown, 4 extractions and a filling  When is this surgery scheduled? TBD   What type of clearance is required (medical clearance vs. Pharmacy clearance to hold med vs. Both)? both  Are there any medications that need to be held prior to surgery and how long? Not listed, please advise if needed  Practice name and name of physician performing surgery? [facility] - Dr Patsey Berthold   What is the office phone number? [phone]   7.   What is the office fax number? [phone]  8.   Anesthesia type (None, local, MAC, general) ? Not listed    Ace Gins 11/11/2020, 3:03 PM  _________________________________________________________________   (provider comments below)

## 2020-11-11 NOTE — Telephone Encounter (Signed)
I was able to s/w the dental office this evening and was able to confirm Tx plan. Pt will be having separate Tx plan for each procedure. I did ask which procedure will the pt be having done first. Dental office states it will be up to th ept which procedure she wants to do first. I explained that we cannot provide a blanket type clearance as the pt I on a blood thinner (Eliquis). Dental office asked why we couldn't just given them a plan for each procedure now. I explained that each procedure may require different protocol in regards to holding or not holding Eliquis. I did inform the dental office the pt has had recurrent strokes and h/o DVT, a-fib. I explained that we do not want to hold Eliquis any longer than absolutely necessary due to cardiac history for the pt. Dental office stated then to just get clearance at this time for the first procedure to be done will be the crown. I thanked the office for their help and that we need to keep the pt as safe as we can during her procedures. Dental office aware that we will need a clearance request for each procedure being done.   Clearance at this time will be for crown.

## 2020-11-11 NOTE — Telephone Encounter (Signed)
Patient with diagnosis of Afib/DVT/CVA on Eliquis for anticoagulation.    Procedure:  Crown, 4 extractions and a filling Date of procedure: TBD  CHA2DS2-VASc Score = 6  This indicates a 9.7% annual risk of stroke. The patient's score is based upon: CHF History: No HTN History: Yes Diabetes History: Yes Stroke History: Yes Vascular Disease History: No Age Score: 1 Gender Score: 1     CrCl 56.4 ml/min  Patient does NOT require pre-op antibiotics for dental procedure.  Please find out if all of this dental work is being done at once, or if it is being done in stages. If its being done in stages (2 teeth at a time), patient may not have to hold anticoagulation which would be the most ideal.

## 2020-11-11 NOTE — Telephone Encounter (Signed)
Will route to callback team to reach out to dentist tomorrow to clarify plans for procedures. Patient has history of multiple issues which make holding of blood thinners less idea - recurrent strokes, atrial fib, history of DVT. Per pharmacist, please find out if all of this dental work is being done at once, or if it is being done in stages. If its being done in stages (2 teeth at a time), patient may not have to hold anticoagulation which would be the most ideal. I agree this may be the safest option if we did not have to hold her anticoagulation. If the dentist feels this is a reasonable approach, we will finalize clearance in that manner.

## 2020-11-11 NOTE — Telephone Encounter (Signed)
Covering preop today. Will to pharmacy team for input on Eliquis given hx of DVT, recurrent CVAs, PAF then patient will need call. When time to finalize clearance, no present indication for SBE ppx based on what I see in chart.

## 2020-11-12 NOTE — Telephone Encounter (Signed)
Continue Eliquis for dental crown procedure.

## 2020-11-12 NOTE — Progress Notes (Signed)
PROVIDER NOTE: Information contained herein reflects review and annotations entered in association with encounter. Interpretation of such information and data should be left to medically-trained personnel. Information provided to patient can be located elsewhere in the medical record under "Patient Instructions". Document created using STT-dictation technology, any transcriptional errors that may result from process are unintentional.    Patient: [name]  Service Category: E/M  Provider: Gaspar Cola, MD  DOB: 07-Jul-1950  DOS: 11/13/2020  Specialty: Interventional Pain Management  MRN: [id number]  Setting: Ambulatory outpatient  PCP: [name], [name], MD  Type: Established Patient    Referring Provider: Orland Mustard *  Location: Office  Delivery: Face-to-face     HPI  [name], a 70 y.o. year old adult, is here today because of her Chronic left shoulder pain [M25.512, G89.29]. [name] primary complain today is Shoulder Pain (right) and Knee Pain (left) Last encounter: My last encounter with her was on 10/23/2020. Pertinent problems: [name] has Diabetic peripheral neuropathy associated with type 2 diabetes mellitus ([place]); Other shoulder lesions (Right); Neuropathy of right lateral femoral cutaneous nerve; Arthralgia; Osteoarthritis involving multiple joints; Status post reverse total shoulder replacement; Chronic pain syndrome; Chronic right shoulder pain; Carpal tunnel syndrome; Cervical radiculitis; Cubital tunnel syndrome on right; Impingement syndrome of shoulder region; Pain in joint involving ankle and foot; Spinal stenosis of thoracic region; Pain of left hip joint; Abnormality of gait and mobility; Diabetic ulcer of left foot ([place]); Muscle strain of left hip; Strain of right hip; Ischial pain, right; Fibromyalgia syndrome; Chronic musculoskeletal pain; Peripheral vascular disease ([place]); Chronic ulcer of right leg ([place]); Meralgia paresthetica of  right side; Chronic pain of right ankle; Arthritis; Left ankle pain; Chronic left shoulder pain; Left hemiparesis ([place]); Primary osteoarthritis of left shoulder; Rotator cuff disorder, left; Ulcer of left heel and midfoot (HCC); PAD (peripheral artery disease) ([place]); Peripheral neurogenic pain; and Chronic peripheral neuropathic pain on their pertinent problem list. Pain Assessment: Severity of Chronic pain is reported as a 8 /10. Location: Shoulder Left/down arm to elbow. Onset: More than a month ago. Quality: Aching, Constant, Discomfort, Nagging. Timing: Constant. Modifying factor(s): rest, had injection in it by MD,. Vitals:  height is '5\' 5"'  (1.651 m) and weight is 260 lb (117.9 kg). Her temperature is 97.1 F (36.2 C) (abnormal). Her blood pressure is 135/74 and her pulse is 85. Her respiration is 16 and oxygen saturation is 99%.   Reason for encounter: medication management.   The patient indicates doing well with the current medication regimen. No adverse reactions or side effects reported to the medications.   UDS ordered today.   One 07/15/2020 the patient received 3 prescriptions for hydrocodone/APAP 5/325 number 90 pills.  Out of these 3 prescriptions written on 07/15/2020 she has only filled 1 on 09/07/2020.  According to the PMP and my calculations from 02/12/2020 to 09/07/2020 (208 days) the patient consumed 3 prescriptions of 90 pills each (270 pills total).  This gives me an average of 1.29pills/day for 39 pills/month.  Today I will change her prescription accordingly.  RTCB: 02/11/2021 Nonopioids transferred 02/19/2020: Lyrica and Cymbalta  Pharmacotherapy Assessment  Analgesic: Hydrocodone/APAP 5/325 mg, 1 tab PO q 8 hours (15 mg/day of hydrocodone) MME/day: 15 mg/day.   Monitoring: [place] PMP: PDMP reviewed during this encounter.       Pharmacotherapy: No side-effects or adverse reactions reported. Compliance: No problems identified. Effectiveness: Clinically acceptable.  Ignatius Specking, RN  11/13/2020  2:15 PM  Sign when Signing Visit Nursing Pain Medication Assessment:  Safety precautions to be maintained throughout the outpatient stay will include: orient to surroundings, keep bed in low position, maintain call bell within reach at all times, provide assistance with transfer out of bed and ambulation.   Patient did not bring medications to her appointment. Instructed to bring all bottles with her to her appointments. Patient with understanding.    UDS:  Summary  Date Value Ref Range Status  11/28/2019 Note  Final    Comment:    ==================================================================== Tox[employer] Select 13 (MW) ==================================================================== Test                             Result       Flag       Units  Drug Present and Declared for Prescription Verification   Alprazolam                     79           EXPECTED   ng/mg creat   Alpha-hydroxyalprazolam        80           EXPECTED   ng/mg creat    Source of alprazolam is a scheduled prescription medication. Alpha-    hydroxyalprazolam is an expected metabolite of alprazolam.  Drug Absent but Declared for Prescription Verification   Hydrocodone                    Not Detected UNEXPECTED ng/mg creat ==================================================================== Test                      Result    Flag   Units      Ref Range   Creatinine              70               mg/dL      >=20 ==================================================================== Declared Medications:  The flagging and interpretation on this report are based on the  following declared medications.  Unexpected results may arise from  inaccuracies in the declared medications.   **Note: The testing scope of this panel includes these medications:   Alprazolam (Xanax)  Hydrocodone   **Note: The testing scope of this panel does not include the  following reported medications:    Acetaminophen (Tylenol)  Acetaminophen  Albuterol  Amlodipine (Norvasc)  Bupropion (Wellbutrin XL)  Dabigatran (Pradaxa)  Duloxetine  Estradiol (Estrace)  Fesoterodine (Toviaz)  Fluticasone (Advair)  Hydrochlorothiazide (Hyzaar)  Levocetirizine (Xyzal)  Levofloxacin  Levothyroxine  Losartan (Hyzaar)  Metformin  Metoprolol  Montelukast  Nystatin  Ondansetron  Pantoprazole  Pregabalin (Lyrica)  Salmeterol (Advair)  Topical  Vitamin D2 (Ergocalciferol)  Zinc Oxide ==================================================================== For clinical consultation, please call [phone]. ====================================================================      ROS  Constitutional: Denies any fever or chills Gastrointestinal: No reported hemesis, hematochezia, vomiting, or acute GI distress Musculoskeletal: Denies any acute onset joint swelling, redness, loss of ROM, or weakness Neurological: No reported episodes of acute onset apraxia, aphasia, dysarthria, agnosia, amnesia, paralysis, loss of coordination, or loss of consciousness  Medication Review  ALPRAZolam, DULoxetine, HYDROcodone-acetaminophen, Tub Transfer Board, Vitamin D3, acetaminophen, albuterol, apixaban, buPROPion, calcium-vitamin D, clobetasol cream, colchicine, collagenase, cyanocobalamin, diclofenac Sodium, doxycycline, ergocalciferol, estradiol, fesoterodine, fluticasone-salmeterol, folic acid, levocetirizine, levothyroxine, metFORMIN, metoprolol succinate, montelukast, multivitamin with minerals, mupirocin ointment, nitrofurantoin (macrocrystal-monohydrate), ondansetron, oxybutynin, pantoprazole, pregabalin, rosuvastatin, sitaGLIPtin,  sucralfate, sulfamethoxazole-trimethoprim, and thiamine  History Review  Allergy: [name] is allergic to morphine and related, penicillins, ambien [zolpidem], aspirin, and oxytetracycline. Drug: [name]  reports no history of drug use. Alcohol:  reports no history of  alcohol use. Tobacco:  reports that she has never smoked. She has never used smokeless tobacco. Social: [name]  reports that she has never smoked. She has never used smokeless tobacco. She reports that she does not drink alcohol and does not use drugs. Medical:  has a past medical history of Abnormal antibody titer, Anxiety and depression, Arthritis, Asthma, Cystocele, Depression, Diabetes ([place]), DVT (deep venous thrombosis) ([place]), Dyspnea, Dysrhythmia, Edema, GERD (gastroesophageal reflux disease), Heart murmur, History of IBS, History of kidney stones, History of methicillin resistant staphylococcus aureus (MRSA) (2008), Hyperlipidemia, Hypertension, Hypothyroidism, Insomnia, Kidney stone, Neuropathy involving both lower extremities, Obesity, Class II, BMI 35-39.9, with comorbidity, Paroxysmal atrial fibrillation ([place]) (2007), Peripheral vascular disease ([place]), Sleep apnea, Urinary incontinence, and Venous stasis dermatitis of both lower extremities. Surgical: Ms. Lamba  has a past surgical history that includes Ankle surgery (Right); Vaginal hysterectomy; Sleeve Gastroplasty; Lithotripsy; Kidney surgery (Right); transthoracic echocardiogram (08/03/2013); transesophageal echocardiogram (08/08/2013); Tonsillectomy; Total knee arthroplasty (Left); Hiatal hernia repair; Cholecystectomy; I & D extremity (Left, 06/27/2015); Application if wound vac (Left, 06/27/2015); removal of left hematoma (Left); NM GATED MYOVIEW [facility] HX) (February 2017); Reverse shoulder arthroplasty (Right, 10/08/2015); Hernia repair; Ulnar nerve transposition (Right, 08/06/2016); arm surgery; Irrigation and debridement abscess (Left, 09/07/2016); Incision and drainage of wound (Left, 09/25/2016); Application if wound vac (Left, 09/25/2016); Colonoscopy; Upper gi endoscopy; Esophagogastroduodenoscopy (egd) with propofol (N/A, 01/11/2017); Colonoscopy with propofol (N/A, 01/11/2017); Colonoscopy with propofol (N/A, 02/22/2017); OTHER SURGICAL  HISTORY; Total shoulder replacement (Right); Joint replacement (2013); Colonoscopy with propofol (N/A, 05/31/2017); Lower Extremity Angiography (Left, 04/04/2018); Esophagogastroduodenoscopy (egd) with propofol (N/A, 10/25/2018); Incision and drainage of wound (Left, 11/09/2018); and Esophagogastroduodenoscopy (egd) with propofol (N/A, 11/29/2019). Family: family history includes Alcohol abuse in her father; Arthritis in her maternal grandfather, maternal grandmother, mother, paternal grandfather, and paternal grandmother; Breast cancer in her maternal aunt; Cancer in her father and maternal aunt; Cerebral aneurysm in her father; Diabetes in her father, maternal grandmother, and paternal grandmother; Heart disease in her maternal grandfather; Hypertension in her father, maternal grandfather, maternal grandmother, paternal grandfather, and paternal grandmother; Kidney disease in her mother; Mental illness in her sister; Peripheral vascular disease in her father; Skin cancer in her father; Varicose Veins in her mother.  Laboratory Chemistry Profile   Renal Lab Results  Component Value Date   BUN 22 10/15/2020   CREATININE 1.19 10/15/2020   LABCREA 48 08/22/2020   BCR 16 11/09/2019   GFR 46.13 (L) 10/15/2020   GFRAA 58 (L) 12/25/2019   GFRNONAA >60 04/14/2020    Hepatic Lab Results  Component Value Date   AST 15 08/22/2020   ALT 10 08/22/2020   ALBUMIN 3.9 08/22/2020   ALKPHOS 100 08/22/2020   LIPASE 27 07/28/2018   AMMONIA <9 (L) 02/07/2020    Electrolytes Lab Results  Component Value Date   NA 138 10/15/2020   K 4.8 10/15/2020   CL 102 10/15/2020   CALCIUM 8.4 10/15/2020   MG 2.1 04/14/2020   PHOS 4.1 04/14/2020    Bone Lab Results  Component Value Date   VD25OH 35.97 08/22/2020    Inflammation (CRP: Acute Phase) (ESR: Chronic Phase) Lab Results  Component Value Date   CRP 3.1 (H) 04/08/2020   ESRSEDRATE 60 (H) 04/08/2020   LATICACIDVEN 1.1  12/20/2019         Note: Above Lab  results reviewed.  Recent Imaging Review  DG Toe 4th Left CLINICAL DATA:  Left fourth toe pain and swelling.  EXAM: LEFT FOURTH TOE  COMPARISON:  None.  FINDINGS: There is no evidence of fracture or dislocation. There is no evidence of arthropathy or other focal bone abnormality. Soft tissues are unremarkable.  IMPRESSION: Negative.  Electronically Signed   By: Marijo Conception M.D.   On: 11/01/2020 13:29 DG Knee Complete 4 Views Left CLINICAL DATA:  Left knee pain and swelling.  EXAM: LEFT KNEE - COMPLETE 4+ VIEW  COMPARISON:  None.  FINDINGS: Status post left total knee arthroplasty. The left femoral and tibial components are well situated. No fracture, dislocation or effusion is noted.  IMPRESSION: Status post left total knee arthroplasty. No acute abnormality is noted.  Electronically Signed   By: Marijo Conception M.D.   On: 11/01/2020 13:28 DG Chest 2 View CLINICAL DATA:  Wheezing.  EXAM: CHEST - 2 VIEW  COMPARISON:  April 05, 2020.  FINDINGS: The heart size and mediastinal contours are within normal limits. Status post right total shoulder arthroplasty. Left lung is clear. Stable elevated right hemidiaphragm is noted with minimal right basilar subsegmental atelectasis or scarring.  IMPRESSION: Stable elevated right hemidiaphragm with minimal right basilar subsegmental atelectasis or scarring.  Electronically Signed   By: Marijo Conception M.D.   On: 11/01/2020 13:26 Note: Reviewed        Physical Exam  General appearance: Well nourished, well developed, and well hydrated. In no apparent acute distress Mental status: Alert, oriented x 3 (person, place, & time)       Respiratory: No evidence of acute respiratory distress Eyes: PERLA Vitals: BP 135/74   Pulse 85   Temp (!) 97.1 F (36.2 C)   Resp 16   Ht '5\' 5"'  (1.651 m)   Wt 260 lb (117.9 kg)   SpO2 99%   BMI 43.27 kg/m  BMI: Estimated body mass index is 43.27 kg/m as calculated from  the following:   Height as of this encounter: '5\' 5"'  (1.651 m).   Weight as of this encounter: 260 lb (117.9 kg). Ideal: Ideal body weight: 57 kg (125 lb 10.6 oz) Adjusted ideal body weight: 81.4 kg (179 lb 6.4 oz)  Assessment   Status Diagnosis  Controlled Controlled Controlled 1. Chronic left shoulder pain   2. Primary osteoarthritis of left shoulder   3. Rotator cuff disorder, left   4. Chronic pain syndrome   5. Pharmacologic therapy   6. Chronic use of opiate for therapeutic purpose   7. Encounter for medication management      Updated Problems: Problem  Rotator Cuff Disorder, Left  Primary Osteoarthritis of Left Shoulder  Chronic Left Shoulder Pain   Last Assessment & Plan:  Formatting of this note might be different from the original. Continues with left upper arm and shoulder pain. Had prior to the strokes. Family called Dr. Roland Rack who recommended an injection. Voltaren gel has been ordered but not given yet   Allergic Rhinitis    Plan of Care  Problem-specific:  No problem-specific Assessment & Plan notes found for this encounter.  [name] has a current medication list which includes the following long-term medication(s): albuterol, apixaban, bupropion, calcium-vitamin d, colchicine, duloxetine, advair hfa, hydrocodone-acetaminophen, [START ON 12/13/2020] hydrocodone-acetaminophen, [START ON 01/12/2021] hydrocodone-acetaminophen, levocetirizine, levothyroxine, metformin, metoprolol succinate, montelukast, pregabalin, rosuvastatin, and sitagliptin.  Pharmacotherapy (Medications  Ordered): Meds ordered this encounter  Medications   HYDROcodone-acetaminophen (NORCO/VICODIN) 5-325 MG tablet    Sig: Take 1 tablet by mouth 2 (two) times daily as needed for severe pain. Must last 30 days    Dispense:  45 tablet    Refill:  0    Not a duplicate. Do NOT delete! Dispense 1 day early if closed on refill date. Avoid benzodiazepines within 8 hours of opioids. Do not  send refill requests.   HYDROcodone-acetaminophen (NORCO/VICODIN) 5-325 MG tablet    Sig: Take 1 tablet by mouth 2 (two) times daily as needed for severe pain. Must last 30 days    Dispense:  45 tablet    Refill:  0    Not a duplicate. Do NOT delete! Dispense 1 day early if closed on refill date. Avoid benzodiazepines within 8 hours of opioids. Do not send refill requests.   HYDROcodone-acetaminophen (NORCO/VICODIN) 5-325 MG tablet    Sig: Take 1 tablet by mouth 2 (two) times daily as needed for severe pain. Must last 30 days    Dispense:  45 tablet    Refill:  0    Not a duplicate. Do NOT delete! Dispense 1 day early if closed on refill date. Avoid benzodiazepines within 8 hours of opioids. Do not send refill requests.    Orders:  Orders Placed This Encounter  Procedures   ToxASSURE Select 13 (MW), Urine    Volume: 30 ml(s). Minimum 3 ml of urine is needed. Document temperature of fresh sample. Indications: Long term (current) use of opiate analgesic [phone])    Order Specific Question:   Release to patient    Answer:   Immediate   Nursing Instructions:    Please call this patient's pharmacy and cancel any older opioid analgesic scripts that may be left from prior visits.  Tell the pharmacist to only keep those sent today.    Follow-up plan:   Return in about 3 months (around 02/11/2021) for Eval-day(M,W), (F2F), (MM).     Interventional therapies: Planned, scheduled, and/or pending:   Not at this time. (Always stop Pradaxa x 5 days prior to any blocks.)   Considering:   NOTE: PRADAXA ANTICOAGULATION (Stop: 5 days  Restart: 6 hours) NO LUMBAR RFA until BMI < 35. Diagnostic right suprascapular NB Diagnostic right IA knee injection  Possible series of 5 Hyalgan knee injections Diagnostic right-sided genicular NB Possible right-sided genicular nerve RFA  Diagnostic right-sided suprascapular NB  Possible right-sided suprascapular nerve RFA    Palliative PRN treatment(s):    Palliative right IA shoulder joint injection  Palliative right lateral femoral cutaneous NB Palliative right lateral femoral continuous nerve RFA #2     Recent Visits No visits were found meeting these conditions. Showing recent visits within past 90 days and meeting all other requirements Today's Visits Date Type Provider Dept  11/13/20 Office Visit Milinda Pointer, MD [facility]  Showing today's visits and meeting all other requirements Future Appointments Date Type Provider Dept  02/10/21 Appointment Milinda Pointer, MD [facility]  Showing future appointments within next 90 days and meeting all other requirements I discussed the assessment and treatment plan with the patient. The patient was provided an opportunity to ask questions and all were answered. The patient agreed with the plan and demonstrated an understanding of the instructions.  Patient advised to call back or seek an in-person evaluation if the symptoms or condition worsens.  Duration of encounter: 30 minutes.  Note by: Gaspar Cola, MD  Date: 11/13/2020; Time: 2:47 PM

## 2020-11-12 NOTE — Telephone Encounter (Addendum)
   Patient Name: [name]  DOB: 03-12-1951 MRN: [id number]  Primary Cardiologist: Kathlyn Sacramento, MD  Chart reviewed as part of pre-operative protocol coverage. Original procedure requested was for crown, 4 extractions and a filling. Patient has history of multiple issues which make holding of blood thinners less ideal - she has had multiple recurrent strokes, atrial fib, and history of DVT. It has been suggested that staging procedure would be beneficial so that we do not have to interrupt her anticoagulation at all. (It is also generally accepted that for simple extractions of 1-2 teeth at a time, fillings, crowns, and dental cleanings, there is no need to interrupt blood thinner therapy.) As below, the dental office has replied that they only plan to proceed with crown at this time. Therefore would not hold Eliquis. Going forward if patient needs to have more than 2 teeth extracted at one time, please reach out to our office to revisit clearance.  SBE prophylaxis is not required for the patient from a cardiac standpoint.  I will route this recommendation to the requesting party via Epic fax function and remove from pre-op pool.  Please call with questions.  Charlie Pitter, PA-C 11/12/2020, 7:52 AM

## 2020-11-13 ENCOUNTER — Other Ambulatory Visit: Payer: Self-pay

## 2020-11-13 ENCOUNTER — Ambulatory Visit: Payer: Medicare HMO | Attending: Pain Medicine | Admitting: Pain Medicine

## 2020-11-13 ENCOUNTER — Telehealth: Payer: Self-pay | Admitting: *Deleted

## 2020-11-13 ENCOUNTER — Encounter: Payer: Self-pay | Admitting: Pain Medicine

## 2020-11-13 VITALS — BP 135/74 | HR 85 | Temp 97.1°F | Resp 16 | Ht 65.0 in | Wt 260.0 lb

## 2020-11-13 DIAGNOSIS — Z79899 Other long term (current) drug therapy: Secondary | ICD-10-CM | POA: Diagnosis not present

## 2020-11-13 DIAGNOSIS — G8929 Other chronic pain: Secondary | ICD-10-CM

## 2020-11-13 DIAGNOSIS — M19012 Primary osteoarthritis, left shoulder: Secondary | ICD-10-CM | POA: Diagnosis not present

## 2020-11-13 DIAGNOSIS — G894 Chronic pain syndrome: Secondary | ICD-10-CM | POA: Diagnosis not present

## 2020-11-13 DIAGNOSIS — M25512 Pain in left shoulder: Secondary | ICD-10-CM | POA: Diagnosis not present

## 2020-11-13 DIAGNOSIS — Z79891 Long term (current) use of opiate analgesic: Secondary | ICD-10-CM | POA: Diagnosis not present

## 2020-11-13 DIAGNOSIS — M67912 Unspecified disorder of synovium and tendon, left shoulder: Secondary | ICD-10-CM | POA: Diagnosis not present

## 2020-11-13 MED ORDER — HYDROCODONE-ACETAMINOPHEN 5-325 MG PO TABS: 1.0000 | ORAL_TABLET | Freq: Two times a day (BID) | ORAL | 0 refills | Status: DC | PRN

## 2020-11-13 NOTE — Patient Instructions (Signed)
____________________________________________________________________________________________  Medication Rules  Purpose: To inform patients, and their family members, of our rules and regulations.  Applies to: All patients receiving prescriptions (written or electronic).  Pharmacy of record: Pharmacy where electronic prescriptions will be sent. If written prescriptions are taken to a different pharmacy, please inform the nursing staff. The pharmacy listed in the electronic medical record should be the one where you would like electronic prescriptions to be sent.  Electronic prescriptions: In compliance with the [place] Strengthen Opioid Misuse Prevention (STOP) Act of 2017 (Session Law 2017-74/H243), effective March 23, 2018, all controlled substances must be electronically prescribed. Calling prescriptions to the pharmacy will cease to exist.  Prescription refills: Only during scheduled appointments. Applies to all prescriptions.  NOTE: The following applies primarily to controlled substances (Opioid* Pain Medications).   Type of encounter (visit): For patients receiving controlled substances, face-to-face visits are required. (Not an option or up to the patient.)  Patient's responsibilities: Pain Pills: Bring all pain pills to every appointment (except for procedure appointments). Pill Bottles: Bring pills in original pharmacy bottle. Always bring the newest bottle. Bring bottle, even if empty. Medication refills: You are responsible for knowing and keeping track of what medications you take and those you need refilled. The day before your appointment: write a list of all prescriptions that need to be refilled. The day of the appointment: give the list to the admitting nurse. Prescriptions will be written only during appointments. No prescriptions will be written on procedure days. If you forget a medication: it will not be "Called in", "Faxed", or "electronically sent". You will  need to get another appointment to get these prescribed. No early refills. Do not call asking to have your prescription filled early. Prescription Accuracy: You are responsible for carefully inspecting your prescriptions before leaving our office. Have the discharge nurse carefully go over each prescription with you, before taking them home. Make sure that your name is accurately spelled, that your address is correct. Check the name and dose of your medication to make sure it is accurate. Check the number of pills, and the written instructions to make sure they are clear and accurate. Make sure that you are given enough medication to last until your next medication refill appointment. Taking Medication: Take medication as prescribed. When it comes to controlled substances, taking less pills or less frequently than prescribed is permitted and encouraged. Never take more pills than instructed. Never take medication more frequently than prescribed.  Inform other Doctors: Always inform, all of your healthcare providers, of all the medications you take. Pain Medication from other Providers: You are not allowed to accept any additional pain medication from any other Doctor or Healthcare provider. There are two exceptions to this rule. (see below) In the event that you require additional pain medication, you are responsible for notifying us, as stated below. Cough Medicine: Often these contain an opioid, such as codeine or hydrocodone. Never accept or take cough medicine containing these opioids if you are already taking an opioid* medication. The combination may cause respiratory failure and death. Medication Agreement: You are responsible for carefully reading and following our Medication Agreement. This must be signed before receiving any prescriptions from our practice. Safely store a copy of your signed Agreement. Violations to the Agreement will result in no further prescriptions. (Additional copies of our  Medication Agreement are available upon request.) Laws, Rules, & Regulations: All patients are expected to follow all Federal and State Laws, Statutes, Rules, & Regulations. Ignorance of   the Laws does not constitute a valid excuse.  Illegal drugs and Controlled Substances: The use of illegal substances (including, but not limited to marijuana and its derivatives) and/or the illegal use of any controlled substances is strictly prohibited. Violation of this rule may result in the immediate and permanent discontinuation of any and all prescriptions being written by our practice. The use of any illegal substances is prohibited. Adopted CDC guidelines & recommendations: Target dosing levels will be at or below 60 MME/day. Use of benzodiazepines** is not recommended.  Exceptions: There are only two exceptions to the rule of not receiving pain medications from other Healthcare Providers. Exception #1 (Emergencies): In the event of an emergency (i.e.: accident requiring emergency care), you are allowed to receive additional pain medication. However, you are responsible for: As soon as you are able, call our office [phone], at any time of the day or night, and leave a message stating your name, the date and nature of the emergency, and the name and dose of the medication prescribed. In the event that your call is answered by a member of our staff, make sure to document and save the date, time, and the name of the person that took your information.  Exception #2 (Planned Surgery): In the event that you are scheduled by another doctor or dentist to have any type of surgery or procedure, you are allowed (for a period no longer than 30 days), to receive additional pain medication, for the acute post-op pain. However, in this case, you are responsible for picking up a copy of our "Post-op Pain Management for Surgeons" handout, and giving it to your surgeon or dentist. This document is available at our office, and  does not require an appointment to obtain it. Simply go to our office during business hours (Monday-Thursday from 8:00 AM to 4:00 PM) (Friday 8:00 AM to 12:00 Noon) or if you have a scheduled appointment with us, prior to your surgery, and ask for it by name. In addition, you are responsible for: calling our office [phone], at any time of the day or night, and leaving a message stating your name, name of your surgeon, type of surgery, and date of procedure or surgery. Failure to comply with your responsibilities may result in termination of therapy involving the controlled substances.  *Opioid medications include: morphine, codeine, oxycodone, oxymorphone, hydrocodone, hydromorphone, meperidine, tramadol, tapentadol, buprenorphine, fentanyl, methadone. **Benzodiazepine medications include: diazepam (Valium), alprazolam (Xanax), clonazepam (Klonopine), lorazepam (Ativan), clorazepate (Tranxene), chlordiazepoxide (Librium), estazolam (Prosom), oxazepam (Serax), temazepam (Restoril), triazolam (Halcion) (Last updated: 02/19/2020) ____________________________________________________________________________________________  ____________________________________________________________________________________________  Medication Recommendations and Reminders  Applies to: All patients receiving prescriptions (written and/or electronic).  Medication Rules & Regulations: These rules and regulations exist for your safety and that of others. They are not flexible and neither are we. Dismissing or ignoring them will be considered "non-compliance" with medication therapy, resulting in complete and irreversible termination of such therapy. (See document titled "Medication Rules" for more details.) In all conscience, because of safety reasons, we cannot continue providing a therapy where the patient does not follow instructions.  Pharmacy of record:  Definition: This is the pharmacy where your electronic  prescriptions will be sent.  We do not endorse any particular pharmacy, however, we have experienced problems with [facility] not securing enough medication supply for the community. We do not restrict you in your choice of pharmacy. However, once we write for your prescriptions, we will NOT be re-sending more prescriptions to fix restricted supply problems   created by your pharmacy, or your insurance.  The pharmacy listed in the electronic medical record should be the one where you want electronic prescriptions to be sent. If you choose to change pharmacy, simply notify our nursing staff.  Recommendations: Keep all of your pain medications in a safe place, under lock and key, even if you live alone. We will NOT replace lost, stolen, or damaged medication. After you fill your prescription, take 1 week's worth of pills and put them away in a safe place. You should keep a separate, properly labeled bottle for this purpose. The remainder should be kept in the original bottle. Use this as your primary supply, until it runs out. Once it's gone, then you know that you have 1 week's worth of medicine, and it is time to come in for a prescription refill. If you do this correctly, it is unlikely that you will ever run out of medicine. To make sure that the above recommendation works, it is very important that you make sure your medication refill appointments are scheduled at least 1 week before you run out of medicine. To do this in an effective manner, make sure that you do not leave the office without scheduling your next medication management appointment. Always ask the nursing staff to show you in your prescription , when your medication will be running out. Then arrange for the receptionist to get you a return appointment, at least 7 days before you run out of medicine. Do not wait until you have 1 or 2 pills left, to come in. This is very poor planning and does not take into consideration that we may need to  cancel appointments due to bad weather, sickness, or emergencies affecting our staff. DO NOT ACCEPT A "Partial Fill": If for any reason your pharmacy does not have enough pills/tablets to completely fill or refill your prescription, do not allow for a "partial fill". The law allows the pharmacy to complete that prescription within 72 hours, without requiring a new prescription. If they do not fill the rest of your prescription within those 72 hours, you will need a separate prescription to fill the remaining amount, which we will NOT provide. If the reason for the partial fill is your insurance, you will need to talk to the pharmacist about payment alternatives for the remaining tablets, but again, DO NOT ACCEPT A PARTIAL FILL, unless you can trust your pharmacist to obtain the remainder of the pills within 72 hours.  Prescription refills and/or changes in medication(s):  Prescription refills, and/or changes in dose or medication, will be conducted only during scheduled medication management appointments. (Applies to both, written and electronic prescriptions.) No refills on procedure days. No medication will be changed or started on procedure days. No changes, adjustments, and/or refills will be conducted on a procedure day. Doing so will interfere with the diagnostic portion of the procedure. No phone refills. No medications will be "called into the pharmacy". No Fax refills. No weekend refills. No Holliday refills. No after hours refills.  Remember:  Business hours are:  Monday to Thursday 8:00 AM to 4:00 PM Provider's Schedule: Nateisha Moyd, MD - Appointments are:  Medication management: Monday and Wednesday 8:00 AM to 4:00 PM Procedure day: Tuesday and Thursday 7:30 AM to 4:00 PM Bilal Lateef, MD - Appointments are:  Medication management: Tuesday and Thursday 8:00 AM to 4:00 PM Procedure day: Monday and Wednesday 7:30 AM to 4:00 PM (Last update:  10/11/2019) ____________________________________________________________________________________________  ____________________________________________________________________________________________  CBD (cannabidiol) WARNING    Applicable to: All individuals currently taking or considering taking CBD (cannabidiol) and, more important, all patients taking opioid analgesic controlled substances (pain medication). (Example: oxycodone; oxymorphone; hydrocodone; hydromorphone; morphine; methadone; tramadol; tapentadol; fentanyl; buprenorphine; butorphanol; dextromethorphan; meperidine; codeine; etc.)  Legal status: CBD remains a Schedule I drug prohibited for any use. CBD is illegal with one exception. In the [place], CBD has a limited Food and Drug Administration (FDA) approval for the treatment of two specific types of epilepsy disorders. Only one CBD product has been approved by the FDA for this purpose: "Epidiolex". FDA is aware that some companies are marketing products containing cannabis and cannabis-derived compounds in ways that violate the Federal Food, Drug and Cosmetic Act (FD&C Act) and that may put the health and safety of consumers at risk. The FDA, a Federal agency, has not enforced the CBD status since 2018.   Legality: Some manufacturers ship CBD products nationally, which is illegal. Often such products are sold online and are therefore available throughout the country. CBD is openly sold in head shops and health food stores in some states where such sales have not been explicitly legalized. Selling unapproved products with unsubstantiated therapeutic claims is not only a violation of the law, but also can put patients at risk, as these products have not been proven to be safe or effective. Federal illegality makes it difficult to conduct research on CBD.  Reference: "FDA Regulation of Cannabis and Cannabis-Derived Products, Including Cannabidiol (CBD)" -  https://www.fda.gov/news-events/public-health-focus/fda-regulation-cannabis-and-cannabis-derived-products-including-cannabidiol-cbd  Warning: CBD is not FDA approved and has not undergo the same manufacturing controls as prescription drugs.  This means that the purity and safety of available CBD may be questionable. Most of the time, despite manufacturer's claims, it is contaminated with THC (delta-9-tetrahydrocannabinol - the chemical in marijuana responsible for the "HIGH").  When this is the case, the THC contaminant will trigger a positive urine drug screen (UDS) test for Marijuana (carboxy-THC). Because a positive UDS for any illicit substance is a violation of our medication agreement, your opioid analgesics (pain medicine) may be permanently discontinued.  MORE ABOUT CBD  General Information: CBD  is a derivative of the Marijuana (cannabis sativa) plant discovered in 1940. It is one of the 113 identified substances found in Marijuana. It accounts for up to 40% of the plant's extract. As of 2018, preliminary clinical studies on CBD included research for the treatment of anxiety, movement disorders, and pain. CBD is available and consumed in multiple forms, including inhalation of smoke or vapor, as an aerosol spray, and by mouth. It may be supplied as an oil containing CBD, capsules, dried cannabis, or as a liquid solution. CBD is thought not to be as psychoactive as THC (delta-9-tetrahydrocannabinol - the chemical in marijuana responsible for the "HIGH"). Studies suggest that CBD may interact with different biological target receptors in the body, including cannabinoid and other neurotransmitter receptors. As of 2018 the mechanism of action for its biological effects has not been determined.  Side-effects  Adverse reactions: Dry mouth, diarrhea, decreased appetite, fatigue, drowsiness, malaise, weakness, sleep disturbances, and others.  Drug interactions: CBC may interact with other medications  such as blood-thinners. (Last update: 10/28/2019) ____________________________________________________________________________________________  ____________________________________________________________________________________________  Drug Holidays (Slow)  What is a "Drug Holiday"? Drug Holiday: is the name given to the period of time during which a patient stops taking a medication(s) for the purpose of eliminating tolerance to the drug.  Benefits Improved effectiveness of opioids. Decreased opioid dose needed to achieve benefits. Improved pain with lesser dose.    What is tolerance? Tolerance: is the progressive decreased in effectiveness of a drug due to its repetitive use. With repetitive use, the body gets use to the medication and as a consequence, it loses its effectiveness. This is a common problem seen with opioid pain medications. As a result, a larger dose of the drug is needed to achieve the same effect that used to be obtained with a smaller dose.  How long should a "Drug Holiday" last? You should stay off of the pain medicine for at least 14 consecutive days. (2 weeks)  Should I stop the medicine "cold turkey"? No. You should always coordinate with your Pain Specialist so that he/she can provide you with the correct medication dose to make the transition as smoothly as possible.  How do I stop the medicine? Slowly. You will be instructed to decrease the daily amount of pills that you take by one (1) pill every seven (7) days. This is called a "slow downward taper" of your dose. For example: if you normally take four (4) pills per day, you will be asked to drop this dose to three (3) pills per day for seven (7) days, then to two (2) pills per day for seven (7) days, then to one (1) per day for seven (7) days, and at the end of those last seven (7) days, this is when the "Drug Holiday" would start.   Will I have withdrawals? By doing a "slow downward taper" like this one, it  is unlikely that you will experience any significant withdrawal symptoms. Typically, what triggers withdrawals is the sudden stop of a high dose opioid therapy. Withdrawals can usually be avoided by slowly decreasing the dose over a prolonged period of time. If you do not follow these instructions and decide to stop your medication abruptly, withdrawals may be possible.  What are withdrawals? Withdrawals: refers to the wide range of symptoms that occur after stopping or dramatically reducing opiate drugs after heavy and prolonged use. Withdrawal symptoms do not occur to patients that use low dose opioids, or those who take the medication sporadically. Contrary to benzodiazepine (example: Valium, Xanax, etc.) or alcohol withdrawals ("Delirium Tremens"), opioid withdrawals are not lethal. Withdrawals are the physical manifestation of the body getting rid of the excess receptors.  Expected Symptoms Early symptoms of withdrawal may include: Agitation Anxiety Muscle aches Increased tearing Insomnia Runny nose Sweating Yawning  Late symptoms of withdrawal may include: Abdominal cramping Diarrhea Dilated pupils Goose bumps Nausea Vomiting  Will I experience withdrawals? Due to the slow nature of the taper, it is very unlikely that you will experience any.  What is a slow taper? Taper: refers to the gradual decrease in dose.  (Last update: 10/11/2019) ____________________________________________________________________________________________    

## 2020-11-13 NOTE — Telephone Encounter (Signed)
All outstanding prescriptions for Hydrocodone discontinued.

## 2020-11-13 NOTE — Progress Notes (Signed)
Nursing Pain Medication Assessment:  Safety precautions to be maintained throughout the outpatient stay will include: orient to surroundings, keep bed in low position, maintain call bell within reach at all times, provide assistance with transfer out of bed and ambulation.   Patient did not bring medications to her appointment. Instructed to bring all bottles with her to her appointments. Patient with understanding.

## 2020-11-14 ENCOUNTER — Telehealth: Payer: Self-pay | Admitting: Internal Medicine

## 2020-11-14 DIAGNOSIS — I872 Venous insufficiency (chronic) (peripheral): Secondary | ICD-10-CM | POA: Diagnosis not present

## 2020-11-14 DIAGNOSIS — E1159 Type 2 diabetes mellitus with other circulatory complications: Secondary | ICD-10-CM | POA: Diagnosis not present

## 2020-11-14 DIAGNOSIS — E11621 Type 2 diabetes mellitus with foot ulcer: Secondary | ICD-10-CM | POA: Diagnosis not present

## 2020-11-14 DIAGNOSIS — L97422 Non-pressure chronic ulcer of left heel and midfoot with fat layer exposed: Secondary | ICD-10-CM | POA: Diagnosis not present

## 2020-11-14 DIAGNOSIS — I69319 Unspecified symptoms and signs involving cognitive functions following cerebral infarction: Secondary | ICD-10-CM | POA: Diagnosis not present

## 2020-11-14 DIAGNOSIS — E1142 Type 2 diabetes mellitus with diabetic polyneuropathy: Secondary | ICD-10-CM | POA: Diagnosis not present

## 2020-11-14 DIAGNOSIS — G8194 Hemiplegia, unspecified affecting left nondominant side: Secondary | ICD-10-CM | POA: Diagnosis not present

## 2020-11-14 DIAGNOSIS — E1151 Type 2 diabetes mellitus with diabetic peripheral angiopathy without gangrene: Secondary | ICD-10-CM | POA: Diagnosis not present

## 2020-11-14 DIAGNOSIS — I152 Hypertension secondary to endocrine disorders: Secondary | ICD-10-CM | POA: Diagnosis not present

## 2020-11-14 DIAGNOSIS — I4819 Other persistent atrial fibrillation: Secondary | ICD-10-CM | POA: Diagnosis not present

## 2020-11-14 NOTE — Telephone Encounter (Signed)
Rejection Reason - Patient Declined - Patient canceled appt and did not want to reschedule" [name] said on Nov 14, 2020 3:22 PM  Msg from [facility] rheumatology

## 2020-11-15 ENCOUNTER — Telehealth: Payer: Self-pay

## 2020-11-15 NOTE — Telephone Encounter (Signed)
Confirmed fax for [place] of care form to [facility]. Sent to scan

## 2020-11-16 LAB — TOXASSURE SELECT 13 (MW), URINE

## 2020-11-18 ENCOUNTER — Encounter: Payer: Self-pay | Admitting: Family Medicine

## 2020-11-18 ENCOUNTER — Telehealth (INDEPENDENT_AMBULATORY_CARE_PROVIDER_SITE_OTHER): Payer: Medicare HMO | Admitting: Family Medicine

## 2020-11-18 VITALS — Temp 98.1°F | Ht 66.0 in

## 2020-11-18 DIAGNOSIS — Z8673 Personal history of transient ischemic attack (TIA), and cerebral infarction without residual deficits: Secondary | ICD-10-CM | POA: Diagnosis not present

## 2020-11-18 DIAGNOSIS — R4701 Aphasia: Secondary | ICD-10-CM

## 2020-11-18 DIAGNOSIS — U071 COVID-19: Secondary | ICD-10-CM

## 2020-11-18 MED ORDER — MOLNUPIRAVIR EUA 200MG CAPSULE: 4.0000 | ORAL_CAPSULE | Freq: Two times a day (BID) | ORAL | 0 refills | Status: AC

## 2020-11-18 NOTE — Progress Notes (Signed)
Dawn Laneve T. Javae Braaten, MD Primary Care and Sports Medicine [facility] [place], [address] Phone: [phone]  FAX: [phone]  [name] - 70 y.o. adult  MRN [id number]  Date of Birth: Feb 10, 1951  Visit Date: 11/18/2020  PCP: [name], [name], MD  Referred by: Orland Mustard *  Virtual Visit via Video Note:  I connected with  [name] on 11/18/2020  3:20 PM EDT by a video enabled telemedicine application and verified that I am speaking with the correct person using two identifiers.   Location patient: home computer, tablet, or smartphone Location provider: work or home office Consent: Verbal consent directly obtained from [name]. Persons participating in the virtual visit: patient, provider  I discussed the limitations of evaluation and management by telemedicine and the availability of in person appointments. The patient expressed understanding and agreed to proceed.  Interactive audio and video telecommunications were attempted between this provider and patient, however failed, due to patient having technical difficulties OR patient did not have access to video capability.  We continued and completed visit with audio only.    Chief Complaint  Patient presents with   Covid Positive    Positive home test on Sunday   Cough   Sore Throat   Fatigue    History of Present Illness:  In a patient with confirmed COVID-19 who also has a history of cerebrovascular accident, she relates a history of having [id number], she is certainly sick, and she has been in bed for 3 days.  She is coughing has a sore throat and she is fatigued and does not feel well.  She also tells me that she had at least 36 hours of aphasia, she was trying to talk and she could not remember the names of many words, and she has had a stroke in the fall.  I did my best to elicit additional history which was very difficult  over the telephone.  She told them that she had difficulty walking, and she required a walker, and she also stated that she has had some difficulty walking compared to her prior/stroke.  Previously, she did have some weakness after stroke, and she is not really clear, but she think she probably did not have any weakness.  In bed x 3 days. In the fall had some strokes.  Stayed in bed. One of her eyes got bloodshot. No blurred vision.  ? Maybe.  With trying to talk - was having some aphasia.  Saturday to Sunday. Yesterday, had problems. Now is speaking ok.  At least 36 hours.  When she went to sleep, she cried, and her left shoulder had some excruciating.  Normally will hurt some.   No weakness or numbness.   Walked with a walker last night to the bathroom. Kind of week with walking to the bathroom.  After her last stroke, she was weak.   ? Unsure if from being sick.   Memory:  felt like her thinking patterns were different.  Could not think of particular words in particular.    Has had some issues with balance and walking.  She also had this before.  ? Fever.  Immunization History  Administered Date(s) Administered   Fluad Quad(high Dose 65+) 02/07/2020   Influenza, High Dose Seasonal PF 11/28/2014, 02/02/2018, 01/31/2019, 02/20/2019   Influenza, Seasonal, Injecte, Preservative Fre 01/17/2011   Influenza,inj,Quad PF,6+ Mos 12/18/2013   Influenza-Unspecified 12/18/2013, 12/31/2015, 11/27/2016, 02/02/2018  PFIZER(Purple Top)SARS-COV-2 Vaccination 06/29/2019, 07/29/2019, 01/22/2020   Pneumococcal Conjugate-13 11/27/2016   Pneumococcal Polysaccharide-23 01/17/2011, 12/17/2014   Tdap 03/09/2018      Review of Systems as above: See pertinent positives and pertinent negatives per HPI No acute distress verbally   Observations/Objective/Exam:  An attempt was made to discern vital signs over the phone and per patient if applicable and possible.   General:    Alert,  Oriented, appears well and in no acute distress  Pulmonary:     On inspection no signs of respiratory distress.  Psych / Neurological:     Pleasant and cooperative.  Assessment and Plan:    ICD-10-CM   1. Aphasia  R47.01     2. COVID-19  U07.1     3. History of completed stroke  Z86.73      Total encounter time: 30 minutes. This includes total time spent on the day of encounter.  This includes chart review on the patient with this complex medical interaction.  She certainly has [id number].  I am more concerned about the global neurological changes in a patient with history of stroke and active COVID-19.  I did my best to elicit this history over the telephone.  The patient is a relatively poor historian.  At the very least, she did have expressive aphasia for 36 hours or more.  She relates other issues such as trouble walking as well as pain in her left shoulder.  She also has had some issues with walking since her prior stroke.  This is a difficult case, and I do not think he can be adequately assessed over the telephone.  The patient has active COVID-19, and she needs to be assessed to make sure she has not had an acute stroke.  My recommendation was for her to go to the emergency room to be evaluated face-to-face.  This is the only option given her COVID-19.  She understands that if she ignores my medical advice then she can have increased risk of additional stroke as well as potentially life-threatening cerebrovascular accident.  I did review with her that having active COVID-19 does increase her risk of any blood clot or stroke.  At this point, the patient is going to think about going to the ER, and she is going to discuss this with her husband.  The patient is an adult without documented dementia.  She ultimately is able to make this decision regarding her health care and her life.  I discussed the assessment and treatment plan with the patient. The patient was provided an  opportunity to ask questions and all were answered. The patient agreed with the plan and demonstrated an understanding of the instructions.   The patient was advised to call back or seek an in-person evaluation if the symptoms worsen or if the condition fails to improve as anticipated.  Follow-up: prn unless noted otherwise below No follow-ups on file.  Meds ordered this encounter  Medications   molnupiravir EUA 200 mg CAPS    Sig: Take 4 capsules (800 mg total) by mouth 2 (two) times daily for 5 days.    Dispense:  40 capsule    Refill:  0   No orders of the defined types were placed in this encounter.   Signed,  Maud Deed. Devondre Guzzetta, MD

## 2020-11-19 ENCOUNTER — Ambulatory Visit: Payer: Medicare HMO | Admitting: Physician Assistant

## 2020-11-20 ENCOUNTER — Ambulatory Visit: Payer: Medicare HMO | Admitting: Obstetrics and Gynecology

## 2020-11-20 ENCOUNTER — Telehealth: Payer: Self-pay

## 2020-11-20 NOTE — Telephone Encounter (Signed)
Orders to [facility] home health confirmed faxed and sent to scan.

## 2020-11-21 ENCOUNTER — Telehealth: Payer: Self-pay

## 2020-11-21 ENCOUNTER — Ambulatory Visit (INDEPENDENT_AMBULATORY_CARE_PROVIDER_SITE_OTHER): Payer: Medicare HMO | Admitting: Nurse Practitioner

## 2020-11-21 ENCOUNTER — Encounter (INDEPENDENT_AMBULATORY_CARE_PROVIDER_SITE_OTHER): Payer: Medicare HMO

## 2020-11-21 DIAGNOSIS — E11621 Type 2 diabetes mellitus with foot ulcer: Secondary | ICD-10-CM | POA: Diagnosis not present

## 2020-11-21 DIAGNOSIS — I4819 Other persistent atrial fibrillation: Secondary | ICD-10-CM | POA: Diagnosis not present

## 2020-11-21 DIAGNOSIS — I69319 Unspecified symptoms and signs involving cognitive functions following cerebral infarction: Secondary | ICD-10-CM | POA: Diagnosis not present

## 2020-11-21 DIAGNOSIS — I152 Hypertension secondary to endocrine disorders: Secondary | ICD-10-CM | POA: Diagnosis not present

## 2020-11-21 DIAGNOSIS — L97422 Non-pressure chronic ulcer of left heel and midfoot with fat layer exposed: Secondary | ICD-10-CM | POA: Diagnosis not present

## 2020-11-21 DIAGNOSIS — I872 Venous insufficiency (chronic) (peripheral): Secondary | ICD-10-CM | POA: Diagnosis not present

## 2020-11-21 DIAGNOSIS — E1159 Type 2 diabetes mellitus with other circulatory complications: Secondary | ICD-10-CM | POA: Diagnosis not present

## 2020-11-21 DIAGNOSIS — E1142 Type 2 diabetes mellitus with diabetic polyneuropathy: Secondary | ICD-10-CM | POA: Diagnosis not present

## 2020-11-21 DIAGNOSIS — G8194 Hemiplegia, unspecified affecting left nondominant side: Secondary | ICD-10-CM | POA: Diagnosis not present

## 2020-11-21 DIAGNOSIS — E1151 Type 2 diabetes mellitus with diabetic peripheral angiopathy without gangrene: Secondary | ICD-10-CM | POA: Diagnosis not present

## 2020-11-21 NOTE — Telephone Encounter (Signed)
[facility] orders have been signed, confirmed faxed, and sent to scan.

## 2020-11-22 ENCOUNTER — Telehealth: Payer: Self-pay

## 2020-11-22 NOTE — Telephone Encounter (Signed)
Confirmed faxed plan of care form to [facility] home health. Form has been sent to scan.

## 2020-11-29 ENCOUNTER — Encounter: Payer: Medicare HMO | Attending: Physician Assistant | Admitting: Physician Assistant

## 2020-11-29 ENCOUNTER — Other Ambulatory Visit: Payer: Self-pay

## 2020-11-29 DIAGNOSIS — L97812 Non-pressure chronic ulcer of other part of right lower leg with fat layer exposed: Secondary | ICD-10-CM | POA: Diagnosis not present

## 2020-11-29 DIAGNOSIS — L97522 Non-pressure chronic ulcer of other part of left foot with fat layer exposed: Secondary | ICD-10-CM | POA: Diagnosis not present

## 2020-11-29 DIAGNOSIS — E11621 Type 2 diabetes mellitus with foot ulcer: Secondary | ICD-10-CM | POA: Insufficient documentation

## 2020-11-29 DIAGNOSIS — Z86718 Personal history of other venous thrombosis and embolism: Secondary | ICD-10-CM | POA: Diagnosis not present

## 2020-11-29 DIAGNOSIS — I872 Venous insufficiency (chronic) (peripheral): Secondary | ICD-10-CM | POA: Diagnosis not present

## 2020-11-29 DIAGNOSIS — I10 Essential (primary) hypertension: Secondary | ICD-10-CM | POA: Insufficient documentation

## 2020-11-29 DIAGNOSIS — Z7901 Long term (current) use of anticoagulants: Secondary | ICD-10-CM | POA: Insufficient documentation

## 2020-11-29 NOTE — Progress Notes (Addendum)
[name], [name] ([id number]) Visit Report for 11/29/2020 Arrival Information Details Patient Name: [name] Date of Service: 11/29/2020 1:15 PM Medical Record Number: [id number] Patient Account Number: [id number] Date of Birth/Sex: 1951/03/04 (70 y.o. F) Treating RN: Donnamarie Poag Primary Care Yaseen Gilberg: [name], Olivia Mackie Other Clinician: Referring Micky Overturf: [name], Olivia Mackie Treating Yadier Bramhall/Extender: [name] in Treatment: 55 Visit Information History Since Last Visit Added or deleted any medications: No Patient Arrived: Wheel Chair Had a fall or experienced change in No Arrival Time: 13:12 activities of daily living that may affect Accompanied By: husband risk of falls: Transfer Assistance: EasyPivot Patient Lift Hospitalized since last visit: No Patient Identification Verified: Yes Has Dressing in Place as Prescribed: Yes Secondary Verification Process Completed: Yes Pain Present Now: Yes Patient Has Alerts: Yes Patient Alerts: Patient on Blood Thinner Eliquis Diabetic II 06/2020 ABI L)1.22 R)1.14 Electronic Signature(s) Signed: 11/29/2020 4:12:02 PM By: Donnamarie Poag Entered By: Donnamarie Poag on 11/29/2020 13:14:22 [name] ([id number]) -------------------------------------------------------------------------------- Clinic Level of Care Assessment Details Patient Name: [name] Date of Service: 11/29/2020 1:15 PM Medical Record Number: [id number] Patient Account Number: [id number] Date of Birth/Sex: [birth year] (70 y.o. F) Treating RN: Donnamarie Poag Primary Care Sehaj Kolden: [name], Olivia Mackie Other Clinician: Referring Sharmila Wrobleski: [name], Olivia Mackie Treating Bakari Nikolai/Extender: [name] in Treatment: 22 Clinic Level of Care Assessment Items TOOL 1 Quantity Score _0  - Use when EandM and Procedure is performed on INITIAL visit 0 ASSESSMENTS - Nursing Assessment / Reassessment _1  - General Physical Exam (combine w/ comprehensive  assessment (listed just below) when performed on new 0 pt. evals) _2  - 0 Comprehensive Assessment (HX, ROS, Risk Assessments, Wounds Hx, etc.) ASSESSMENTS - Wound and Skin Assessment / Reassessment _3  - Dermatologic / Skin Assessment (not related to wound area) 0 ASSESSMENTS - Ostomy and/or Continence Assessment and Care _4  - Incontinence Assessment and Management 0 _5  - 0 Ostomy Care Assessment and Management (repouching, etc.) PROCESS - Coordination of Care _6  - Simple Patient / Family Education for ongoing care 0 _7  - 0 Complex (extensive) Patient / Family Education for ongoing care _8  - 0 Staff obtains Programmer, systems, Records, Test Results / Process Orders _9  - 0 Staff telephones HHA, Nursing Homes / Clarify orders / etc _10  - 0 Routine Transfer to another Facility (non-emergent condition) _11  - 0 Routine Hospital Admission (non-emergent condition) _12  - 0 New Admissions / Biomedical engineer / Ordering NPWT, Apligraf, etc. _13  - 0 Emergency Hospital Admission (emergent condition) PROCESS - Special Needs _14  - Pediatric / Minor Patient Management 0 _15  - 0 Isolation Patient Management _16  - 0 Hearing / Language / Visual special needs _17  - 0 Assessment of Community assistance (transportation, D/C planning, etc.) _18  - 0 Additional assistance / Altered mentation _19  - 0 Support Surface(s) Assessment (bed, cushion, seat, etc.) INTERVENTIONS - Miscellaneous _20  - External ear exam 0 _21  - 0 Patient Transfer (multiple staff / Civil Service fast streamer / Similar devices) _22  - 0 Simple Staple / Suture removal (25 or less) _23  - 0 Complex Staple / Suture removal (26 or more) _24  - 0 Hypo/Hyperglycemic Management (do not check if billed separately) _25  - 0 Ankle / Brachial Index (ABI) - do not check if billed separately Has the patient been seen at the hospital within the last three years: Yes Total Score: 0 Level Of Care: ____ [name] ([id number]) Electronic Signature(s) Signed:  11/29/2020 4:12:02 PM By: Donnamarie Poag Entered By: Donnamarie Poag on 11/29/2020 13:48:43 Holloran, Lileigh B. ([id number]) -------------------------------------------------------------------------------- Complex / Palliative Patient Assessment  Details Patient Name: [name], [name] Date of Service: 11/29/2020 1:15 PM Medical Record Number: [id number] Patient Account Number: [id number] Date of Birth/Sex: [birth year] (70 y.o. F) Treating RN: Donnamarie Poag Primary Care Yentl Verge: [name], Olivia Mackie Other Clinician: Referring Yoltzin Barg: [name], Olivia Mackie Treating Medhansh Brinkmeier/Extender: [name] in Treatment: 22 Palliative Management Criteria Complex Wound Management Criteria Patient has remarkable or complex co-morbidities requiring medications or treatments that extend wound healing times. Examples: o Diabetes mellitus with chronic renal failure or end stage renal disease requiring dialysis o Advanced or poorly controlled rheumatoid arthritis o Diabetes mellitus and end stage chronic obstructive pulmonary disease o Active cancer with current chemo- or radiation therapy DM, PVD, OBESITY/ IMMOMILITY, ARTHRITIS Care Approach Wound Care Plan: Complex Wound Management Electronic Signature(s) Signed: 11/29/2020 4:12:02 PM By: Donnamarie Poag Signed: 11/29/2020 4:41:08 PM By: Worthy Keeler PA-C Entered By: Donnamarie Poag on 11/29/2020 13:29:49 Lofaso, Joni [name] ([id number]) -------------------------------------------------------------------------------- Encounter Discharge Information Details Patient Name: [name] Date of Service: 11/29/2020 1:15 PM Medical Record Number: [id number] Patient Account Number: [id number] Date of Birth/Sex: [birth year] (70 y.o. F) Treating RN: Donnamarie Poag Primary Care Stephanine Reas: [name], Olivia Mackie Other Clinician: Referring Aliyanna Wassmer: [name], Olivia Mackie Treating Annali Lybrand/Extender: [name] in Treatment: 22 Encounter Discharge Information Items  Post Procedure Vitals Discharge Condition: Stable Temperature (F): 98.3 Ambulatory Status: Wheelchair Pulse (bpm): 67 Discharge Destination: Home Respiratory Rate (breaths/min): 18 Transportation: Private Auto Blood Pressure (mmHg): 136/78 Accompanied By: husband Schedule Follow-up Appointment: Yes Clinical Summary of Care: Electronic Signature(s) Signed: 11/29/2020 4:12:02 PM By: Donnamarie Poag Entered By: Donnamarie Poag on 11/29/2020 14:00:32 [name] ([id number]) -------------------------------------------------------------------------------- Lower Extremity Assessment Details Patient Name: [name] Date of Service: 11/29/2020 1:15 PM Medical Record Number: [id number] Patient Account Number: [id number] Date of Birth/Sex: 1951-03-11 (70 y.o. F) Treating RN: Donnamarie Poag Primary Care Jediah Horger: [name], Olivia Mackie Other Clinician: Referring Adriana Lina: [name], Olivia Mackie Treating Sumaya Riedesel/Extender: [name] in Treatment: 22 Edema Assessment Assessed: [Left: Yes] Patrice Paradise: Yes] [Left: Edema] [Right: :] Calf Left: Right: Point of Measurement: 31 cm From Medial Instep 32 cm 32.5 cm Ankle Left: Right: Point of Measurement: 11 cm From Medial Instep 17 cm 17.5 cm Knee To Floor Left: Right: From Medial Instep 40 cm 40 cm Vascular Assessment Pulses: Dorsalis Pedis Palpable: [Left:Yes] [Right:Yes] Electronic Signature(s) Signed: 11/29/2020 4:12:02 PM By: Donnamarie Poag Entered By: Donnamarie Poag on 11/29/2020 13:27:30 Ciesla, Joni [name] ([id number]) -------------------------------------------------------------------------------- Multi Wound Chart Details Patient Name: [name] Date of Service: 11/29/2020 1:15 PM Medical Record Number: [id number] Patient Account Number: [id number] Date of Birth/Sex: 02/10/1951 (70 y.o. F) Treating RN: Donnamarie Poag Primary Care Dez Stauffer: [name], Olivia Mackie Other Clinician: Referring Kalen Neidert: [name], Olivia Mackie Treating  Garry Bochicchio/Extender: [name] in Treatment: 22 Vital Signs Height(in): 65.5 Pulse(bpm): 5 Weight(lbs): 240 Blood Pressure(mmHg): 136/78 Body Mass Index(BMI): 39 Temperature(F): 98.3 Respiratory Rate(breaths/min): 18 Photos: Wound Location: Right, Midline Lower Leg Right, Lateral Elbow Right, Medial Forearm Wounding Event: Gradually Appeared Trauma Skin Tear/Laceration Primary Etiology: Venous Leg Ulcer Trauma, Other Skin Tear Comorbid History: Asthma, Pneumothorax, Asthma, Pneumothorax, Asthma, Pneumothorax, Arrhythmia, Hypertension, Type II Arrhythmia, Hypertension, Type II Arrhythmia, Hypertension, Type II Diabetes, Gout, Osteoarthritis, Diabetes, Gout, Osteoarthritis, Diabetes, Gout, Osteoarthritis, Neuropathy Neuropathy Neuropathy Date Acquired: 10/21/2020 10/25/2020 10/25/2020 Weeks of Treatment: _0 Wound Status: Open Healed - Epithelialized Healed - Epithelialized Measurements L x W x D (cm) 1.7x0.6x0.1 0x0x0 0x0x0 Area (cm) : 0.801 0 0 Volume (cm) : 0.08 0 0 % Reduction in Area: 82.90% 100.00% 100.00% % Reduction in Volume: 82.90% 100.00%  100.00% Classification: Full Thickness Without Exposed Partial Thickness Partial Thickness Support Structures Exudate Amount: Medium Medium N/A Exudate Type: Serosanguineous Serosanguineous N/A Exudate Color: red, brown red, brown N/A Wound Margin: Flat and Intact Flat and Intact N/A Granulation Amount: Small (1-33%) None Present (0%) None Present (0%) Granulation Quality: Red, Pink N/A N/A Necrotic Amount: Large (67-100%) None Present (0%) None Present (0%) Exposed Structures: Fat Layer (Subcutaneous Tissue): Fascia: No N/A Yes Fat Layer (Subcutaneous Tissue): Fascia: No No Tendon: No Tendon: No Muscle: No Muscle: No Joint: No Joint: No Bone: No Bone: No Epithelialization: None None N/A Wound Number: 16 N/A N/A Photos: N/A N/A [name], Dawn B. 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 Comorbid History: Asthma, Pneumothorax, N/A N/A Arrhythmia, Hypertension, Type II Diabetes, Gout, Osteoarthritis, Neuropathy Date Acquired: 10/31/2020 N/A N/A Weeks of Treatment: 0 N/A N/A Wound Status: Open N/A N/A Measurements L x W x D (cm) 1x1x0.1 N/A N/A Area (cm) : 0.785 N/A N/A Volume (cm) : 0.079 N/A N/A % Reduction in Area: N/A N/A N/A % Reduction in Volume: N/A N/A N/A Classification: Grade 2 N/A N/A Exudate Amount: Medium N/A N/A Exudate Type: Serosanguineous N/A N/A Exudate Color: red, brown N/A N/A Wound Margin: N/A N/A N/A Granulation Amount: Small (1-33%) N/A N/A Granulation Quality: Red, Pink N/A N/A Necrotic Amount: Large (67-100%) N/A N/A Exposed Structures: Fat Layer (Subcutaneous Tissue): N/A N/A Yes Fascia: No Tendon: No Muscle: No Joint: No Bone: No Epithelialization: N/A N/A N/A Treatment Notes Electronic Signature(s) Signed: 11/29/2020 4:12:02 PM By: Donnamarie Poag Entered By: Donnamarie Poag on 11/29/2020 13:30:41 [name] ([id number]) -------------------------------------------------------------------------------- [place] Details Patient Name: [name] Date of Service: 11/29/2020 1:15 PM Medical Record Number: [id number] Patient Account Number: [id number] Date of Birth/Sex: [birth year] (70 y.o. F) Treating RN: Donnamarie Poag Primary Care Cailyn Houdek: [name], Olivia Mackie Other Clinician: Referring Zyron Deeley: [name], Olivia Mackie Treating Karaline Buresh/Extender: [name] in Treatment: 32 Active Inactive Abuse / Safety / Falls / Self Care Management Nursing Diagnoses: Potential for injury related to falls Goals: Patient will remain injury free related to falls Date Initiated: 06/28/2020 Target Resolution Date: 11/30/2020 Goal Status: Active Interventions: Assess Activities of Daily Living  upon admission and as needed Assess fall risk on admission and as needed Assess: immobility, friction, shearing, incontinence upon admission and as needed Assess impairment of mobility on admission and as needed per policy Assess personal safety and home safety (as indicated) on admission and as needed Assess self care needs on admission and as needed Notes: Nutrition Nursing Diagnoses: Impaired glucose control: actual or potential Goals: Patient/caregiver verbalizes understanding of need to maintain therapeutic glucose control per primary care physician Date Initiated: 06/28/2020 Target Resolution Date: 11/30/2020 Goal Status: Active Interventions: Assess HgA1c results as ordered upon admission and as needed Assess patient nutrition upon admission and as needed per policy Notes: Venous Leg Ulcer Nursing Diagnoses: Potential for venous Insuffiency (use before diagnosis confirmed) Goals: Patient will maintain optimal edema control Date Initiated: 10/30/2020 Date Inactivated: 11/29/2020 Target Resolution Date: 11/21/2020 Goal Status: Met Patient/caregiver will verbalize understanding of disease process and disease management Date Initiated: 10/30/2020 Date Inactivated: 11/29/2020 Target Resolution Date: 11/21/2020 Goal Status: Met Verify adequate tissue perfusion prior to therapeutic compression application Date Initiated: 10/30/2020 Target Resolution Date: 11/21/2020 Goal Status: Active Interventions: Assess peripheral edema status every visit. RASHELL, SHAMBAUGH ([id number]) Compression as ordered Provide education on venous  insufficiency Treatment Activities: Therapeutic compression applied : 10/30/2020 Notes: Wound/Skin Impairment Nursing Diagnoses: Knowledge deficit related to ulceration/compromised skin integrity Goals: Patient/caregiver will verbalize understanding of skin care regimen Date Initiated: 06/28/2020 Date Inactivated: 08/16/2020 Target Resolution Date: 07/28/2020 Goal  Status: Met Ulcer/skin breakdown will have a volume reduction of 30% by week 4 Date Initiated: 06/28/2020 Date Inactivated: 08/16/2020 Target Resolution Date: 07/28/2020 Goal Status: Met Ulcer/skin breakdown will have a volume reduction of 50% by week 8 Date Initiated: 06/28/2020 Date Inactivated: 09/13/2020 Target Resolution Date: 08/28/2020 Goal Status: Met Ulcer/skin breakdown will have a volume reduction of 80% by week 12 Date Initiated: 06/28/2020 Target Resolution Date: 09/27/2020 Goal Status: Active Ulcer/skin breakdown will heal within 14 weeks Date Initiated: 06/28/2020 Target Resolution Date: 10/28/2020 Goal Status: Active Interventions: Assess patient/caregiver ability to obtain necessary supplies Assess patient/caregiver ability to perform ulcer/skin care regimen upon admission and as needed Assess ulceration(s) every visit Notes: Electronic Signature(s) Signed: 11/29/2020 4:12:02 PM By: Donnamarie Poag Entered By: Donnamarie Poag on 11/29/2020 13:28:17 Salido, Joni [name] ([id number]) -------------------------------------------------------------------------------- Pain Assessment Details Patient Name: [name] Date of Service: 11/29/2020 1:15 PM Medical Record Number: [id number] Patient Account Number: [id number] Date of Birth/Sex: 02/03/1951 (70 y.o. F) Treating RN: Donnamarie Poag Primary Care Alyria Krack: [name], Olivia Mackie Other Clinician: Referring Kraven Calk: [name], Olivia Mackie Treating Orvetta Danielski/Extender: [name] in Treatment: 22 Active Problems Location of Pain Severity and Description of Pain Patient Has Paino Yes Site Locations Pain Location: Generalized Pain Rate the pain. Current Pain Level: 7 Pain Management and Medication Current Pain Management: Electronic Signature(s) Signed: 11/29/2020 4:12:02 PM By: Donnamarie Poag Entered By: Donnamarie Poag on 11/29/2020 13:18:32 Fayson, Joni [name]  ([id number]) -------------------------------------------------------------------------------- Patient/Caregiver Education Details Patient Name: [name] Date of Service: 11/29/2020 1:15 PM Medical Record Number: [id number] Patient Account Number: [id number] Date of Birth/Gender: Oct 04, 1950 (70 y.o. F) Treating RN: Donnamarie Poag Primary Care Physician: [name], Olivia Mackie Other Clinician: Referring Physician: [name], Olivia Mackie Treating Physician/Extender: [name] in Treatment: 22 Education Assessment Education Provided To: Patient and Caregiver Education Topics Provided Basic Hygiene: Nutrition: Offloading: Venous: Wound Debridement: Wound/Skin Impairment: Electronic Signature(s) Signed: 11/29/2020 4:12:02 PM By: Donnamarie Poag Entered By: Donnamarie Poag on 11/29/2020 13:36:02 Wale, Joni [name] ([id number]) -------------------------------------------------------------------------------- Wound Assessment Details Patient Name: [name] Date of Service: 11/29/2020 1:15 PM Medical Record Number: [id number] Patient Account Number: [id number] Date of Birth/Sex: 08-31-1950 (70 y.o. F) Treating RN: Donnamarie Poag Primary Care Deklynn Charlet: [name], Olivia Mackie Other Clinician: Referring Cathern Tahir: [name], Olivia Mackie Treating Angalina Ante/Extender: [name] in Treatment: 22 Wound Status Wound Number: 13 Primary Venous Leg Ulcer Etiology: Wound Location: Right, Midline Lower Leg Wound Open Wounding Event: Gradually Appeared Status: Date Acquired: 10/21/2020 Comorbid Asthma, Pneumothorax, Arrhythmia, Hypertension, Type Weeks Of Treatment: 4 History: II Diabetes, Gout, Osteoarthritis, Neuropathy Clustered Wound: No Photos Wound Measurements Length: (cm) 1.7 Width: (cm) 0.6 Depth: (cm) 0.1 Area: (cm) 0.801 Volume: (cm) 0.08 % Reduction in Area: 82.9% % Reduction in Volume: 82.9% Epithelialization: None Tunneling: No Undermining: No Wound  Description Classification: Full Thickness Without Exposed Support Structu Wound Margin: Flat and Intact Exudate Amount: Medium Exudate Type: Serosanguineous Exudate Color: red, brown res Foul Odor After Cleansing: No Slough/Fibrino Yes Wound Bed Granulation Amount: Small (1-33%) Exposed Structure Granulation Quality: Red, Pink Fascia Exposed: No Necrotic Amount: Large (67-100%) Fat Layer (Subcutaneous Tissue) Exposed: Yes Necrotic Quality: Adherent Slough Tendon Exposed: No Muscle Exposed: No Joint Exposed: No Bone Exposed: No Electronic Signature(s) Signed: 11/29/2020 4:12:02 PM By: Donnamarie Poag Entered ByDonnamarie Poag on 11/29/2020  13:22:09 [name], [name] ([id number]) -------------------------------------------------------------------------------- Wound Assessment Details Patient Name: [name], [name] Date of Service: 11/29/2020 1:15 PM Medical Record Number: [id number] Patient Account Number: [id number] Date of Birth/Sex: 04-01-1950 (70 y.o. F) Treating RN: Donnamarie Poag Primary Care Tanieka Pownall: [name], Olivia Mackie Other Clinician: Referring Winter Trefz: [name], Olivia Mackie Treating Talyah Seder/Extender: [name] in Treatment: 22 Wound Status Wound Number: 14 Primary Trauma, Other Etiology: Wound Location: Right, Lateral Elbow Wound Healed - Epithelialized Wounding Event: Trauma Status: Date Acquired: 10/25/2020 Comorbid Asthma, Pneumothorax, Arrhythmia, Hypertension, Type Weeks Of Treatment: 4 History: II Diabetes, Gout, Osteoarthritis, Neuropathy Clustered Wound: No Photos Wound Measurements Length: (cm) 0 Width: (cm) 0 Depth: (cm) 0 Area: (cm) 0 Volume: (cm) 0 % Reduction in Area: 100% % Reduction in Volume: 100% Epithelialization: None Wound Description Classification: Partial Thickness Wound Margin: Flat and Intact Exudate Amount: Medium Exudate Type: Serosanguineous Exudate Color: red, brown Foul Odor After Cleansing: No Slough/Fibrino  Yes Wound Bed Granulation Amount: None Present (0%) Exposed Structure Necrotic Amount: None Present (0%) Fascia Exposed: No Fat Layer (Subcutaneous Tissue) Exposed: No Tendon Exposed: No Muscle Exposed: No Joint Exposed: No Bone Exposed: No Electronic Signature(s) Signed: 11/29/2020 4:12:02 PM By: Donnamarie Poag Entered By: Donnamarie Poag on 11/29/2020 13:21:07 [name] ([id number]) -------------------------------------------------------------------------------- Wound Assessment Details Patient Name: [name] Date of Service: 11/29/2020 1:15 PM Medical Record Number: [id number] Patient Account Number: [id number] Date of Birth/Sex: Oct 12, 1950 (70 y.o. F) Treating RN: Donnamarie Poag Primary Care Amesha Bailey: [name], Olivia Mackie Other Clinician: Referring [place] Hair: [name], Olivia Mackie Treating Arya Luttrull/Extender: [name] in Treatment: 22 Wound Status Wound Number: 15 Primary Skin Tear Etiology: Wound Location: Right, Medial Forearm Wound Healed - Epithelialized Wounding Event: Skin Tear/Laceration Status: Date Acquired: 10/25/2020 Comorbid Asthma, Pneumothorax, Arrhythmia, Hypertension, Type Weeks Of Treatment: 4 History: II Diabetes, Gout, Osteoarthritis, Neuropathy Clustered Wound: No Photos Wound Measurements Length: (cm) 0 Width: (cm) 0 Depth: (cm) 0 Area: (cm) 0 Volume: (cm) 0 % Reduction in Area: 100% % Reduction in Volume: 100% Wound Description Classification: Partial Thickness Wound Bed Granulation Amount: None Present (0%) Necrotic Amount: None Present (0%) Electronic Signature(s) Signed: 11/29/2020 4:12:02 PM By: Donnamarie Poag Entered By: Donnamarie Poag on 11/29/2020 13:20:31 Wojcicki, Joni [name] ([id number]) -------------------------------------------------------------------------------- Wound Assessment Details Patient Name: [name] Date of Service: 11/29/2020 1:15 PM Medical Record Number: [id number] Patient Account Number:  [id number] Date of Birth/Sex: 1951/02/12 (70 y.o. F) Treating RN: Cornell Barman Primary Care Ishi Danser: [name], Olivia Mackie Other Clinician: Referring Jonique Kulig: [name], Olivia Mackie Treating Donnabelle Blanchard/Extender: [name] in Treatment: 22 Wound Status Wound Number: 16 Primary Diabetic Wound/Ulcer of the Lower Extremity Etiology: Wound Location: Left, Distal Toe Fourth Wound Open Wounding Event: Pressure Injury Status: Date Acquired: 10/31/2020 Notes: stated toenail was removed by podiatry doctor on Weeks Of Treatment: 0 11/08/20 Clustered Wound: No Comorbid Asthma, Pneumothorax, Arrhythmia, Hypertension, Type History: II Diabetes, Gout, Osteoarthritis, Neuropathy Photos Wound Measurements Length: (cm) 1 Width: (cm) 1 Depth: (cm) 0.1 Area: (cm) 0.785 Volume: (cm) 0.079 % Reduction in Area: 0% % Reduction in Volume: 0% Wound Description Classification: Grade 2 Exudate Amount: Medium Exudate Type: Serosanguineous Exudate Color: red, brown Foul Odor After Cleansing: No Slough/Fibrino Yes Wound Bed Granulation Amount: Small (1-33%) Exposed Structure Granulation Quality: Red, Pink Fascia Exposed: No Necrotic Amount: Large (67-100%) Fat Layer (Subcutaneous Tissue) Exposed: Yes Tendon Exposed: No Muscle Exposed: No Joint Exposed: No Bone Exposed: No Electronic Signature(s) Signed: 01/22/2021 12:36:47 PM By: Gretta Cool, BSN, RN, CWS, Kim RN, BSN Previous Signature: 11/29/2020 4:12:02 PM Version By: Donnamarie Poag  Entered By: Gretta Cool, BSN, RN, CWS, Kim on 12/27/2020 15:56:32 [name], [name] ([id number]) -------------------------------------------------------------------------------- Vitals Details Patient Name: [name] Date of Service: 11/29/2020 1:15 PM Medical Record Number: [id number] Patient Account Number: [id number] Date of Birth/Sex: [birth year] (70 y.o. F) Treating RN: Donnamarie Poag Primary Care Pauleen Goleman: [name], Olivia Mackie Other Clinician: Referring  Ayauna Mcnay: [name], Olivia Mackie Treating Ayyan Sites/Extender: [name] in Treatment: 22 Vital Signs Time Taken: 13:12 Temperature (F): 98.3 Height (in): 65.5 Pulse (bpm): 67 Weight (lbs): 240 Respiratory Rate (breaths/min): 18 Body Mass Index (BMI): 39.3 Blood Pressure (mmHg): 136/78 Reference Range: 80 - 120 mg / dl Electronic Signature(s) Signed: 11/29/2020 4:12:02 PM By: Donnamarie Poag Entered ByDonnamarie Poag on 11/29/2020 13:14:44

## 2020-11-29 NOTE — Progress Notes (Addendum)
KATRENA, KALINOWSKI ([id number]) Visit Report for 11/29/2020 Chief Complaint Document Details Patient Name: [name], [name] Date of Service: 11/29/2020 1:15 PM Medical Record Number: [id number] Patient Account Number: [id number] Date of Birth/Sex: [birth year] (70 y.o. F) Treating RN: Donnamarie Poag Primary Care Provider: [name], Olivia Mackie Other Clinician: Referring Provider: [name], Olivia Mackie Treating Provider/Extender: Skipper Cliche in Treatment: 22 Information Obtained from: Patient Chief Complaint Right Leg Ulcer and Right 3rd Toe Ulcer Electronic Signature(s) Signed: 11/29/2020 1:34:00 PM By: Worthy Keeler PA-C Previous Signature: 11/29/2020 1:18:48 PM Version By: Worthy Keeler PA-C Entered By: Worthy Keeler on 11/29/2020 13:34:00 [name] ([id number]) -------------------------------------------------------------------------------- Debridement Details Patient Name: [name] Date of Service: 11/29/2020 1:15 PM Medical Record Number: [id number] Patient Account Number: [id number] Date of Birth/Sex: 07-Jan-1951 (70 y.o. F) Treating RN: Donnamarie Poag Primary Care Provider: [name], Olivia Mackie Other Clinician: Referring Provider: [name], Olivia Mackie Treating Provider/Extender: Skipper Cliche in Treatment: 22 Debridement Performed for Wound #16 Left,Distal,Anterior Metatarsal head third Assessment: Performed By: Physician Tommie Sams., PA-C Debridement Type: Debridement Severity of Tissue Pre Debridement: Fat layer exposed Level of Consciousness (Pre- Awake and Alert procedure): Pre-procedure Verification/Time Out Yes - 13:36 Taken: Start Time: 13:36 Pain Control: Lidocaine Total Area Debrided (L x W): 1 (cm) x 1 (cm) = 1 (cm) Tissue and other material Viable, Non-Viable, Slough, Subcutaneous, Biofilm, Slough debrided: Level: Skin/Subcutaneous Tissue Debridement Description: Excisional Instrument: Curette Bleeding: Moderate Hemostasis  Achieved: Silver Nitrate Response to Treatment: Procedure was tolerated well Level of Consciousness (Post- Awake and Alert procedure): Post Debridement Measurements of Total Wound Length: (cm) 1 Width: (cm) 1 Depth: (cm) 0.1 Volume: (cm) 0.079 Character of Wound/Ulcer Post Debridement: Improved Severity of Tissue Post Debridement: Fat layer exposed Post Procedure Diagnosis Same as Pre-procedure Electronic Signature(s) Signed: 11/29/2020 4:12:02 PM By: Donnamarie Poag Signed: 11/29/2020 4:41:08 PM By: Worthy Keeler PA-C Entered By: Donnamarie Poag on 11/29/2020 13:48:19 Arman, [name] ([id number]) -------------------------------------------------------------------------------- HPI Details Patient Name: [name] Date of Service: 11/29/2020 1:15 PM Medical Record Number: [id number] Patient Account Number: [id number] Date of Birth/Sex: [birth year] (70 y.o. F) Treating RN: Donnamarie Poag Primary Care Provider: [name], Olivia Mackie Other Clinician: Referring Provider: [name], Olivia Mackie Treating Provider/Extender: Skipper Cliche in Treatment: 22 History of Present Illness HPI Description: 06/28/20 upon evaluation today patient appears to be doing unfortunately somewhat poorly in regard to wounds that she has over her lower extremities. She has a wound on the left distal second toe, left posterior heel, and right anterior lower leg. With that being said all in all none of the wounds appear to be too bad the one that hurts her the most is actually the wound on the posterior heel which actually is also one of the smallest. Nonetheless I think there is some callus hiding what is really going on in this area. Fortunately there does not appear to be any obvious evidence of infection which is great news. Patient does have a history of diabetes mellitus type 2, chronic venous insufficiency, hypertension, history of DVTs, and is on long-term anticoagulant therapy, Eliquis. 07/05/20 upon  evaluation today patient appears to be doing well currently with regard to her wounds. I feel like she is making progress which is great news and overall there is no signs of active infection at this time. No fevers, chills, nausea, vomiting, or diarrhea. 4/29; patient has a only a small area remaining on the tip of her left second toe and a small area remaining on the left heel. The  area on the right anterior lower leg is healed. They tell me that she had remote podiatric surgery on the toes of the left foot however they are very fixed in position and I wonder whether they are making it difficult to relieve pressure in her foot wear. She is a type II diabetic with neuropathy as well. 08/01/2020 upon evaluation today patient appears to be doing well currently in regard to her wounds. In fact everything appears to be doing much better. The posterior aspect of her heel is actually giving her some trouble not the Achilles area but a small wound that we did not even have marked. In fact I had noted this before. Nonetheless we are going to addressed that today. 5/27; most of the patient's wounds are healed including the left Achilles heel left toe. Right forearm is also healed. She has a new abrasion posteriorly in the right elbow area just above the olecranon this happened today with an abrasion from her car door 6/15; the patient's wounds on the left Achilles and left second toe remain healed. The area on the right forearm is also just about healed in fact the skin I replaced last time is looking viable which is good. She had me look at the left heel again which is not in the area of the wound more towards the tip of the heel at the Achilles insertion site as well as the tip of the left foot second toe. In the Achilles area that is clearly is friction probably in bed at night. Not really near where the wound was. The left second toe remains healed although this is hammered and overhanging first toe also  puts pressure in this area 09/13/2020 upon evaluation today patient appears to be doing well with regard to all previous wounds that she had. Everything is completely healed. With that being said I do think that the patient unfortunately has a new skin tear on the right forearm which is due to her put a Band-Aid on it and then when it pulled off this caused some issues with skin tearing. Nonetheless I think that this needs to be addressed today. Hopefully will take too long to get this to heal. She is having some issues with pain in her heel but I think this is more related to be honest to her neuropathy as opposed to anything else. Readmission: 10/30/2020 upon evaluation today patient appears to be doing poorly in regard to her legs bilaterally. She is also having an issue with her fourth toe on the left. Fortunately there does not appear to be any signs of active infection at this time. No fevers, chills, nausea, vomiting, or diarrhea. It is actually been quite a while since we have seen her. In fact June 24 was the last time that she was here in the clinic. She tells me she did not come back because things were just doing "pretty well". 11/29/2020 upon evaluation today patient actually appears to be doing well with regard to her arms those are completely healed. Her right leg is still open but doing okay. The fourth toe of the left foot does show some signs here of having some issues with necrotic tissue of an open wound. This is where a toenail was removed by podiatry. Subsequently I do think that we need to go ahead and see what we can do about getting this cleared away so being try to get some healing going forward. Electronic Signature(s) Signed: 11/29/2020 2:12:48 PM By: Melburn Hake,  Margarita Grizzle PA-C Entered By: Worthy Keeler on 11/29/2020 14:12:48 GLADY, OUTTEN ([id number]) -------------------------------------------------------------------------------- Physical Exam Details Patient Name:  THEMBI, MCFARLAIN Date of Service: 11/29/2020 1:15 PM Medical Record Number: [id number] Patient Account Number: [id number] Date of Birth/Sex: 1950/05/02 (70 y.o. F) Treating RN: Donnamarie Poag Primary Care Provider: [name], Olivia Mackie Other Clinician: Referring Provider: [name], Olivia Mackie Treating Provider/Extender: Skipper Cliche in Treatment: 41 Constitutional Well-nourished and well-hydrated in no acute distress. Respiratory normal breathing without difficulty. Psychiatric this patient is able to make decisions and demonstrates good insight into disease process. Alert and Oriented x 3. pleasant and cooperative. Notes Fortunately there does not appear to be any signs of infection which is great news and overall I am extremely pleased with where we stand currently. upon inspection patient's wound bed actually showed signs of good granulation epithelization at this point. For the most part in regard to the right leg wound. In regard to the toe ulceration there is definitely some necrotic tissue here that we need to try to loosen up. I did perform sharp debridement today to clear this away she had a little bit of bleeding afterwards that we are having some difficulty getting to stop completely for that reason I did end up using silver nitrate to achieve hemostasis she tolerated all of this without complication. Electronic Signature(s) Signed: 11/29/2020 2:13:19 PM By: Worthy Keeler PA-C Entered By: Worthy Keeler on 11/29/2020 14:13:19 LATESHIA, TOMLINSON ([id number]) -------------------------------------------------------------------------------- Physician Orders Details Patient Name: [name] Date of Service: 11/29/2020 1:15 PM Medical Record Number: [id number] Patient Account Number: [id number] Date of Birth/Sex: Jun 18, 1950 (70 y.o. F) Treating RN: Donnamarie Poag Primary Care Provider: [name], Olivia Mackie Other Clinician: Referring Provider: [name], Olivia Mackie Treating  Provider/Extender: Skipper Cliche in Treatment: 35 Verbal / Phone Orders: No Diagnosis Coding ICD-10 Coding Code Description E11.621 Type 2 diabetes mellitus with foot ulcer I87.2 Venous insufficiency (chronic) (peripheral) Z79.01 Long term (current) use of anticoagulants L97.812 Non-pressure chronic ulcer of other part of right lower leg with fat layer exposed L97.522 Non-pressure chronic ulcer of other part of left foot with fat layer exposed Follow-up Appointments o Return Appointment in 1 week. Home Health Wound #13 [place]: - [place] for wound care. May utilize formulary equivalent dressing for wound treatment orders unless otherwise specified. Home Health Nurse may visit PRN to address patientos wound care needs. o Scheduled days for dressing changes to be completed; exception, patient has scheduled wound care visit that day. o **Please direct any NON-WOUND related issues/requests for orders to patient's Primary Care Physician. **If current dressing causes regression in wound condition, may D/C ordered dressing product/s and apply Normal Saline Moist Dressing daily until next [place] or Other MD appointment. **Notify [facility] of regression in wound condition at [phone]. Bathing/ Shower/ Hygiene o May shower with wound dressing protected with water repellent cover or cast protector. o No tub bath. Edema Control - Lymphedema / Segmental Compressive Device / Other o Patient to wear own compression stockings. Remove compression stockings every night before going to bed and put on every morning when getting up. - WHEN NOT USING A COMPRESSION WRAP, USE COMPRESSION HOSE o Elevate, Exercise Daily and Avoid Standing for Long Periods of Time. o Elevate legs to the level of the heart and pump ankles as often as possible o Elevate leg(s) parallel to the floor when  sitting. o DO YOUR BEST to sleep in the bed  at night. DO NOT sleep in your recliner. Long hours of sitting in a recliner leads to swelling of the legs and/or potential wounds on your backside. Off-Loading Wound #16 Left,Distal,Anterior Metatarsal head third o Open toe surgical shoe o Other: - suggest float heels when in bed with a pillow to keep pressure off of heels; choose shoewear for right foot to not rub foot Additional Orders / Instructions o Follow Nutritious Diet and Increase Protein Intake Wound Treatment Wound #13 - Lower Leg Wound Laterality: Right, Midline Cleanser: Soap and Water 3 x Per Week/30 Days Discharge Instructions: Gently cleanse wound with antibacterial soap, rinse and pat dry prior to dressing wounds Cleanser: Wound Cleanser 3 x Per Week/30 Days Discharge Instructions: Wash your hands with soap and water. Remove old dressing, discard into plastic bag and place into trash. Cleanse the wound with Wound Cleanser prior to applying a clean dressing using gauze sponges, not tissues or cotton balls. Do not scrub or use excessive force. Pat dry using gauze sponges, not tissue or cotton balls. Primary Dressing: Hydrofera Blue Ready Transfer Foam, 2.5x2.5 (in/in) (Home Health) 3 x Per Week/30 Days PHILLIS, ORPILLA ([id number]) Discharge Instructions: Apply Hydrofera Blue Ready to wound bed as directed Secondary Dressing: ABD Pad 5x9 (in/in) (Home Health) 3 x Per Week/30 Days Discharge Instructions: Cover with ABD pad Compression Wrap: Profore Lite LF 3 Multilayer Compression Bandaging System (Home Health) 3 x Per Week/30 Days Discharge Instructions: Apply 3 multi-layer wrap as prescribed. Wound #16 - Metatarsal head third Wound Laterality: Left, Anterior, Distal Cleanser: Soap and Water Every Other Day/30 Days Discharge Instructions: Gently cleanse wound with antibacterial soap, rinse and pat dry prior to dressing wounds Cleanser: Wound Cleanser Every Other Day/30  Days Discharge Instructions: Wash your hands with soap and water. Remove old dressing, discard into plastic bag and place into trash. Cleanse the wound with Wound Cleanser prior to applying a clean dressing using gauze sponges, not tissues or cotton balls. Do not scrub or use excessive force. Pat dry using gauze sponges, not tissue or cotton balls. Primary Dressing: Hydrofera Blue Ready Transfer Foam, 2.5x2.5 (in/in) (Home Health) Every Other Day/30 Days Discharge Instructions: Apply Hydrofera Blue Ready to wound bed as directed Secondary Dressing: ABD Pad 5x9 (in/in) Every Other Day/30 Days Discharge Instructions: Cover with ABD pad or cut in half Secured With: [place] [employer] Tape, 2x2 (in/yd) Every Other Day/30 Days Secured With: [employer] Sterile or Non-Sterile 6-ply 4.5x4 (yd/yd) Every Other Day/30 Days Discharge Instructions: Apply Kerlix as directed Electronic Signature(s) Signed: 11/29/2020 4:12:02 PM By: Donnamarie Poag Signed: 11/29/2020 4:41:08 PM By: Worthy Keeler PA-C Entered By: Donnamarie Poag on 11/29/2020 13:47:44 Kellman, [name] ([id number]) -------------------------------------------------------------------------------- Problem List Details Patient Name: [name] Date of Service: 11/29/2020 1:15 PM Medical Record Number: [id number] Patient Account Number: [id number] Date of Birth/Sex: November 07, 1950 (70 y.o. F) Treating RN: Donnamarie Poag Primary Care Provider: [name], Olivia Mackie Other Clinician: Referring Provider: [name], Olivia Mackie Treating Provider/Extender: Skipper Cliche in Treatment: 22 Active Problems ICD-10 Encounter Code Description Active Date MDM Diagnosis E11.621 Type 2 diabetes mellitus with foot ulcer 06/28/2020 No Yes I87.2 Venous insufficiency (chronic) (peripheral) 06/28/2020 No Yes L97.812 Non-pressure chronic ulcer of other part of right lower leg with fat layer 06/28/2020 No Yes exposed L97.522 Non-pressure chronic ulcer of  other part of left foot with fat layer 06/28/2020 No Yes exposed Z79.01 Long term (current) use of anticoagulants 06/28/2020 No Yes Inactive Problems ICD-10 Code Description Active Date Inactive  Date I10 Essential (primary) hypertension 06/28/2020 06/28/2020 Z86.718 Personal history of other venous thrombosis and embolism 06/28/2020 06/28/2020 Resolved Problems ICD-10 Code Description Active Date Resolved Date L97.322 Non-pressure chronic ulcer of left ankle with fat layer exposed 06/28/2020 06/28/2020 S50.811A Abrasion of right forearm, initial encounter 10/30/2020 10/30/2020 S50.311D Abrasion of right elbow, subsequent encounter 08/16/2020 08/16/2020 Electronic Signature(s) OLUFUNKE, BLICKENSTAFF ([id number]) Signed: 11/29/2020 1:33:31 PM By: Worthy Keeler PA-C Previous Signature: 11/29/2020 1:18:41 PM Version By: Worthy Keeler PA-C Entered By: Worthy Keeler on 11/29/2020 13:33:31 Torosian, [name] ([id number]) -------------------------------------------------------------------------------- Progress Note Details Patient Name: [name] Date of Service: 11/29/2020 1:15 PM Medical Record Number: [id number] Patient Account Number: [id number] Date of Birth/Sex: [birth year] (70 y.o. F) Treating RN: Donnamarie Poag Primary Care Provider: [name], Olivia Mackie Other Clinician: Referring Provider: [name], Olivia Mackie Treating Provider/Extender: Skipper Cliche in Treatment: 22 Subjective Chief Complaint Information obtained from Patient Right Leg Ulcer and Right 3rd Toe Ulcer History of Present Illness (HPI) 06/28/20 upon evaluation today patient appears to be doing unfortunately somewhat poorly in regard to wounds that she has over her lower extremities. She has a wound on the left distal second toe, left posterior heel, and right anterior lower leg. With that being said all in all none of the wounds appear to be too bad the one that hurts her the most is actually the wound on the posterior heel which  actually is also one of the smallest. Nonetheless I think there is some callus hiding what is really going on in this area. Fortunately there does not appear to be any obvious evidence of infection which is great news. Patient does have a history of diabetes mellitus type 2, chronic venous insufficiency, hypertension, history of DVTs, and is on long-term anticoagulant therapy, Eliquis. 07/05/20 upon evaluation today patient appears to be doing well currently with regard to her wounds. I feel like she is making progress which is great news and overall there is no signs of active infection at this time. No fevers, chills, nausea, vomiting, or diarrhea. 4/29; patient has a only a small area remaining on the tip of her left second toe and a small area remaining on the left heel. The area on the right anterior lower leg is healed. They tell me that she had remote podiatric surgery on the toes of the left foot however they are very fixed in position and I wonder whether they are making it difficult to relieve pressure in her foot wear. She is a type II diabetic with neuropathy as well. 08/01/2020 upon evaluation today patient appears to be doing well currently in regard to her wounds. In fact everything appears to be doing much better. The posterior aspect of her heel is actually giving her some trouble not the Achilles area but a small wound that we did not even have marked. In fact I had noted this before. Nonetheless we are going to addressed that today. 5/27; most of the patient's wounds are healed including the left Achilles heel left toe. Right forearm is also healed. She has a new abrasion posteriorly in the right elbow area just above the olecranon this happened today with an abrasion from her car door 6/15; the patient's wounds on the left Achilles and left second toe remain healed. The area on the right forearm is also just about healed in fact the skin I replaced last time is looking viable which  is good. She had me look at the left heel again which is  not in the area of the wound more towards the tip of the heel at the Achilles insertion site as well as the tip of the left foot second toe. In the Achilles area that is clearly is friction probably in bed at night. Not really near where the wound was. The left second toe remains healed although this is hammered and overhanging first toe also puts pressure in this area 09/13/2020 upon evaluation today patient appears to be doing well with regard to all previous wounds that she had. Everything is completely healed. With that being said I do think that the patient unfortunately has a new skin tear on the right forearm which is due to her put a Band-Aid on it and then when it pulled off this caused some issues with skin tearing. Nonetheless I think that this needs to be addressed today. Hopefully will take too long to get this to heal. She is having some issues with pain in her heel but I think this is more related to be honest to her neuropathy as opposed to anything else. Readmission: 10/30/2020 upon evaluation today patient appears to be doing poorly in regard to her legs bilaterally. She is also having an issue with her fourth toe on the left. Fortunately there does not appear to be any signs of active infection at this time. No fevers, chills, nausea, vomiting, or diarrhea. It is actually been quite a while since we have seen her. In fact June 24 was the last time that she was here in the clinic. She tells me she did not come back because things were just doing "pretty well". 11/29/2020 upon evaluation today patient actually appears to be doing well with regard to her arms those are completely healed. Her right leg is still open but doing okay. The fourth toe of the left foot does show some signs here of having some issues with necrotic tissue of an open wound. This is where a toenail was removed by podiatry. Subsequently I do think that we need  to go ahead and see what we can do about getting this cleared away so being try to get some healing going forward. Objective Constitutional Well-nourished and well-hydrated in no acute distress. Vitals Time Taken: 1:12 PM, Height: 65.5 in, Weight: 240 lbs, BMI: 39.3, Temperature: 98.3 F, Pulse: 67 bpm, Respiratory Rate: 18 breaths/min, Blood Pressure: 136/78 mmHg. KEI, HUGGER B. ([id number]) Respiratory normal breathing without difficulty. Psychiatric this patient is able to make decisions and demonstrates good insight into disease process. Alert and Oriented x 3. pleasant and cooperative. General Notes: Fortunately there does not appear to be any signs of infection which is great news and overall I am extremely pleased with where we stand currently. upon inspection patient's wound bed actually showed signs of good granulation epithelization at this point. For the most part in regard to the right leg wound. In regard to the toe ulceration there is definitely some necrotic tissue here that we need to try to loosen up. I did perform sharp debridement today to clear this away she had a little bit of bleeding afterwards that we are having some difficulty getting to stop completely for that reason I did end up using silver nitrate to achieve hemostasis she tolerated all of this without complication. Integumentary (Hair, Skin) Wound #13 status is Open. Original cause of wound was Gradually Appeared. The date acquired was: 10/21/2020. The wound has been in treatment 4 weeks. The wound is located on the Right,Midline Lower Leg. The  wound measures 1.7cm length x 0.6cm width x 0.1cm depth; 0.801cm^2 area and 0.08cm^3 volume. There is Fat Layer (Subcutaneous Tissue) exposed. There is no tunneling or undermining noted. There is a medium amount of serosanguineous drainage noted. The wound margin is flat and intact. There is small (1-33%) red, pink granulation within the wound bed. There is a large  (67-100%) amount of necrotic tissue within the wound bed including Adherent Slough. Wound #14 status is Healed - Epithelialized. Original cause of wound was Trauma. The date acquired was: 10/25/2020. The wound has been in treatment 4 weeks. The wound is located on the Right,Lateral Elbow. The wound measures 0cm length x 0cm width x 0cm depth; 0cm^2 area and 0cm^3 volume. There is a medium amount of serosanguineous drainage noted. The wound margin is flat and intact. There is no granulation within the wound bed. There is no necrotic tissue within the wound bed. Wound #15 status is Healed - Epithelialized. Original cause of wound was Skin Tear/Laceration. The date acquired was: 10/25/2020. The wound has been in treatment 4 weeks. The wound is located on the Right,Medial Forearm. The wound measures 0cm length x 0cm width x 0cm depth; 0cm^2 area and 0cm^3 volume. There is no granulation within the wound bed. There is no necrotic tissue within the wound bed. Wound #16 status is Open. Original cause of wound was Pressure Injury. The date acquired was: 10/31/2020. The wound is located on the Left,Distal,Anterior Metatarsal head third. The wound measures 1cm length x 1cm width x 0.1cm depth; 0.785cm^2 area and 0.079cm^3 volume. There is Fat Layer (Subcutaneous Tissue) exposed. There is a medium amount of serosanguineous drainage noted. There is small (1-33%) red, pink granulation within the wound bed. There is a large (67-100%) amount of necrotic tissue within the wound bed including Adherent Slough. 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 There was a Excisional Skin/Subcutaneous Tissue Debridement with a total area of 1 sq cm performed by Tommie Sams., PA-C. With the following instrument(s): Curette to remove Viable and Non-Viable tissue/material. Material removed includes Subcutaneous Tissue, Slough, and Biofilm after achieving pain control using Lidocaine. A time out was conducted at 13:36, prior to the start of the procedure. A Moderate amount of bleeding was controlled with Silver Nitrate. The procedure was tolerated well. Post Debridement Measurements: 1cm length x 1cm width x 0.1cm depth; 0.079cm^3 volume. Character of Wound/Ulcer Post Debridement is improved. Severity of Tissue Post Debridement is: Fat layer exposed. Post procedure Diagnosis Wound #16: Same as Pre-Procedure Plan Follow-up Appointments: ABIGEAL, POERTNER ([id number]) Return Appointment in 1 week. Home Health: Wound #13 Right,Midline Lower Leg: [place]: [place] for wound care. May utilize formulary equivalent dressing for wound treatment orders unless otherwise specified. Home Health Nurse may visit PRN to address patient s wound care needs. Scheduled days for dressing changes to be completed; exception, patient has scheduled wound care visit that day. **Please direct any NON-WOUND related issues/requests for orders to patient's Primary Care Physician. **If current dressing causes regression in wound condition, may D/C ordered  dressing product/s and apply Normal Saline Moist Dressing daily until next [place] or Other MD appointment. **Notify [facility] of regression in wound condition at [phone]. Bathing/ Shower/ Hygiene: May shower with wound dressing protected with water repellent cover or cast protector. No tub bath. Edema Control - Lymphedema / Segmental  Compressive Device / Other: Patient to wear own compression stockings. Remove compression stockings every night before going to bed and put on every morning when getting up. - WHEN NOT USING A COMPRESSION WRAP, USE COMPRESSION HOSE Elevate, Exercise Daily and Avoid Standing for Long Periods of Time. Elevate legs to the level of the heart and pump ankles as often as possible Elevate leg(s) parallel to the floor when sitting. DO YOUR BEST to sleep in the bed at night. DO NOT sleep in your recliner. Long hours of sitting in a recliner leads to swelling of the legs and/or potential wounds on your backside. Off-Loading: Wound #16 Left,Distal,Anterior Metatarsal head third: Open toe surgical shoe Other: - suggest float heels when in bed with a pillow to keep pressure off of heels; choose shoewear for right foot to not rub foot Additional Orders / Instructions: Follow Nutritious Diet and Increase Protein Intake WOUND #13: - Lower Leg Wound Laterality: Right, Midline Cleanser: Soap and Water 3 x Per Week/30 Days Discharge Instructions: Gently cleanse wound with antibacterial soap, rinse and pat dry prior to dressing wounds Cleanser: Wound Cleanser 3 x Per Week/30 Days Discharge Instructions: Wash your hands with soap and water. Remove old dressing, discard into plastic bag and place into trash. Cleanse the wound with Wound Cleanser prior to applying a clean dressing using gauze sponges, not tissues or cotton balls. Do not scrub or use excessive force. Pat dry using gauze sponges, not tissue or cotton balls. Primary Dressing: Hydrofera Blue Ready Transfer Foam, 2.5x2.5 (in/in) (Home Health) 3 x Per Week/30 Days Discharge Instructions: Apply Hydrofera Blue Ready to wound bed as directed Secondary Dressing: ABD Pad 5x9 (in/in) (Home Health) 3 x Per Week/30 Days Discharge Instructions: Cover with ABD pad Compression Wrap: Profore Lite LF 3 Multilayer Compression Bandaging System (Home Health) 3 x Per  Week/30 Days Discharge Instructions: Apply 3 multi-layer wrap as prescribed. WOUND #16: - Metatarsal head third Wound Laterality: Left, Anterior, Distal Cleanser: Soap and Water Every Other Day/30 Days Discharge Instructions: Gently cleanse wound with antibacterial soap, rinse and pat dry prior to dressing wounds Cleanser: Wound Cleanser Every Other Day/30 Days Discharge Instructions: Wash your hands with soap and water. Remove old dressing, discard into plastic bag and place into trash. Cleanse the wound with Wound Cleanser prior to applying a clean dressing using gauze sponges, not tissues or cotton balls. Do not scrub or use excessive force. Pat dry using gauze sponges, not tissue or cotton balls. Primary Dressing: Hydrofera Blue Ready Transfer Foam, 2.5x2.5 (in/in) (Home Health) Every Other Day/30 Days Discharge Instructions: Apply Hydrofera Blue Ready to wound bed as directed Secondary Dressing: ABD Pad 5x9 (in/in) Every Other Day/30 Days Discharge Instructions: Cover with ABD pad or cut in half Secured With: 88M Medipore H Soft Cloth Surgical Tape, 2x2 (in/yd) Every Other Day/30 Days Secured With: Kerlix Roll Sterile or Non-Sterile 6-ply 4.5x4 (yd/yd) Every Other Day/30 Days Discharge Instructions: Apply Kerlix as directed 1. Would recommend currently that we go ahead and continue with the wound care measures as before set for him to make a change using Hydrofera Blue which I think are doing much better for the patient. 2. I am  also going to recommend that we use an ABD pad to cover followed by on the leg a 3 layer compression wrap which I think would do well helping to control some of the edema here as well. 3. I am also can recommend the patient should be elevating her leg and protecting the toe as much as possible. We will see patient back for reevaluation in 1 week here in the clinic. If anything worsens or changes patient will contact our office for  additional recommendations. Electronic Signature(s) Signed: 11/29/2020 2:16:43 PM By: Worthy Keeler PA-C Entered By: Worthy Keeler on 11/29/2020 14:16:42 Urwin, [name] ([id number]) -------------------------------------------------------------------------------- SuperBill Details Patient Name: [name] Date of Service: 11/29/2020 Medical Record Number: [id number] Patient Account Number: [id number] Date of Birth/Sex: 26-Nov-1950 (70 y.o. F) Treating RN: Donnamarie Poag Primary Care Provider: [name], Olivia Mackie Other Clinician: Referring Provider: [name], Olivia Mackie Treating Provider/Extender: Skipper Cliche in Treatment: 22 Diagnosis Coding ICD-10 Codes Code Description E11.621 Type 2 diabetes mellitus with foot ulcer I87.2 Venous insufficiency (chronic) (peripheral) Z79.01 Long term (current) use of anticoagulants L97.812 Non-pressure chronic ulcer of other part of right lower leg with fat layer exposed L97.522 Non-pressure chronic ulcer of other part of left foot with fat layer exposed Facility Procedures The patient participates with Medicare or their insurance follows the Medicare Facility Guidelines: CPT4 Code Description Modifier Quantity [id number] 11042 - DEB SUBQ TISSUE 20 SQ CM/< 1 The patient participates with Medicare or their insurance follows the Medicare Facility Guidelines: ICD-10 Diagnosis Description L97.522 Non-pressure chronic ulcer of other part of left foot with fat layer exposed L97.812 Non-pressure chronic ulcer of  other part of right lower leg with fat layer exposed Physician Procedures CPT4 Code: [id number] Description: 11042 - WC PHYS SUBQ TISS 20 SQ CM Modifier: Quantity: 1 CPT4 Code: Description: ICD-10 Diagnosis Description L97.522 Non-pressure chronic ulcer of other part of left foot with fat layer exp L97.812 Non-pressure chronic ulcer of other part of right lower leg with fat lay Modifier: osed er exposed Quantity: Electronic  Signature(s) Signed: 11/29/2020 2:17:01 PM By: Worthy Keeler PA-C Entered By: Worthy Keeler on 11/29/2020 14:17:01

## 2020-12-02 ENCOUNTER — Ambulatory Visit: Payer: Medicare HMO | Admitting: Pulmonary Disease

## 2020-12-02 DIAGNOSIS — E1159 Type 2 diabetes mellitus with other circulatory complications: Secondary | ICD-10-CM | POA: Diagnosis not present

## 2020-12-02 DIAGNOSIS — E1151 Type 2 diabetes mellitus with diabetic peripheral angiopathy without gangrene: Secondary | ICD-10-CM | POA: Diagnosis not present

## 2020-12-02 DIAGNOSIS — I69319 Unspecified symptoms and signs involving cognitive functions following cerebral infarction: Secondary | ICD-10-CM | POA: Diagnosis not present

## 2020-12-02 DIAGNOSIS — I4819 Other persistent atrial fibrillation: Secondary | ICD-10-CM | POA: Diagnosis not present

## 2020-12-02 DIAGNOSIS — I69320 Aphasia following cerebral infarction: Secondary | ICD-10-CM | POA: Diagnosis not present

## 2020-12-02 DIAGNOSIS — E1142 Type 2 diabetes mellitus with diabetic polyneuropathy: Secondary | ICD-10-CM | POA: Diagnosis not present

## 2020-12-02 DIAGNOSIS — I152 Hypertension secondary to endocrine disorders: Secondary | ICD-10-CM | POA: Diagnosis not present

## 2020-12-02 DIAGNOSIS — L989 Disorder of the skin and subcutaneous tissue, unspecified: Secondary | ICD-10-CM | POA: Diagnosis not present

## 2020-12-02 DIAGNOSIS — J449 Chronic obstructive pulmonary disease, unspecified: Secondary | ICD-10-CM | POA: Diagnosis not present

## 2020-12-02 DIAGNOSIS — I872 Venous insufficiency (chronic) (peripheral): Secondary | ICD-10-CM | POA: Diagnosis not present

## 2020-12-02 DIAGNOSIS — G8194 Hemiplegia, unspecified affecting left nondominant side: Secondary | ICD-10-CM | POA: Diagnosis not present

## 2020-12-03 ENCOUNTER — Telehealth: Payer: Self-pay

## 2020-12-03 NOTE — Telephone Encounter (Signed)
[name] is a 70 y.o. adult was called and contacted re: New pt Pre appt call to collect history information. Pt verified using 2 identifiers. Confirmation that I am speaking with the correct person.  Chart was updated: -Allergy -Medication -Confirm pharmacy -OB history  Pt was notified to arrive 15 min early and we will need a urine sample when she arrives. Pt verbalized understanding.

## 2020-12-06 ENCOUNTER — Encounter: Payer: Self-pay | Admitting: Obstetrics and Gynecology

## 2020-12-06 ENCOUNTER — Other Ambulatory Visit: Payer: Self-pay

## 2020-12-06 ENCOUNTER — Ambulatory Visit (INDEPENDENT_AMBULATORY_CARE_PROVIDER_SITE_OTHER): Payer: Medicare HMO | Admitting: Obstetrics and Gynecology

## 2020-12-06 VITALS — BP 137/78 | HR 61 | Ht 66.0 in | Wt 260.0 lb

## 2020-12-06 DIAGNOSIS — I152 Hypertension secondary to endocrine disorders: Secondary | ICD-10-CM | POA: Diagnosis not present

## 2020-12-06 DIAGNOSIS — N393 Stress incontinence (female) (male): Secondary | ICD-10-CM | POA: Diagnosis not present

## 2020-12-06 DIAGNOSIS — E1159 Type 2 diabetes mellitus with other circulatory complications: Secondary | ICD-10-CM | POA: Diagnosis not present

## 2020-12-06 DIAGNOSIS — G8194 Hemiplegia, unspecified affecting left nondominant side: Secondary | ICD-10-CM | POA: Diagnosis not present

## 2020-12-06 DIAGNOSIS — N3281 Overactive bladder: Secondary | ICD-10-CM

## 2020-12-06 DIAGNOSIS — I4819 Other persistent atrial fibrillation: Secondary | ICD-10-CM | POA: Diagnosis not present

## 2020-12-06 DIAGNOSIS — I69319 Unspecified symptoms and signs involving cognitive functions following cerebral infarction: Secondary | ICD-10-CM | POA: Diagnosis not present

## 2020-12-06 DIAGNOSIS — I69320 Aphasia following cerebral infarction: Secondary | ICD-10-CM | POA: Diagnosis not present

## 2020-12-06 DIAGNOSIS — E1151 Type 2 diabetes mellitus with diabetic peripheral angiopathy without gangrene: Secondary | ICD-10-CM | POA: Diagnosis not present

## 2020-12-06 DIAGNOSIS — J449 Chronic obstructive pulmonary disease, unspecified: Secondary | ICD-10-CM | POA: Diagnosis not present

## 2020-12-06 DIAGNOSIS — E1142 Type 2 diabetes mellitus with diabetic polyneuropathy: Secondary | ICD-10-CM | POA: Diagnosis not present

## 2020-12-06 DIAGNOSIS — I872 Venous insufficiency (chronic) (peripheral): Secondary | ICD-10-CM | POA: Diagnosis not present

## 2020-12-06 NOTE — Progress Notes (Signed)
[facility] New Patient Evaluation and Consultation  Referring Provider: [name], Olivia Mackie * PCP: [name], [name], MD Date of Service: 12/06/2020  SUBJECTIVE Chief Complaint: New Patient (Initial Visit)  History of Present Illness: [name] is a 70 y.o. White or Caucasian female seen in consultation at the request of Dr. [name] for evaluation of incontinence.    Review of records from Epic significant for: Partially responded to oxybutynin and also toviaz. Failed Myrbetriq and Trospium BID.   Also taken estrace cream and cranberry for UTI prevention.   Urinary Symptoms: Leaks urine with cough/ sneeze, laughing, lifting, going from sitting to standing, with a full bladder, with movement to the bathroom, with urgency, without sensation, while asleep, and continuously Leaks 7-8+ time(s) per day.  Pad use: 5-6 adult diapers per day.   She is bothered by her UI symptoms. She takes toviaz in AM and oxybutynin in PM- Dr Lorra Hals prescribed before her stroke, about 2 years.  Leakage with movement is worse than urgency.    Day time voids 8.  Nocturia: 0-1 times per night to void. Has been waking up more often to go to the bathroom rather than wetting the bed, sometimes wets the bed while sleeping.  Voiding dysfunction: she does not empty her bladder well.  does not use a catheter to empty bladder.  When urinating, she feels difficulty starting urine stream, dribbling after finishing, and to push on her belly or vagina to empty bladder Drinks: < 1cup coffee in AM, water, 7up, gingerale   UTIs: 6 UTI's in the last year.  Uses vaginal estrogen cream three times per week.  Denies history of kidney or bladder stones and pyelonephritis  Pelvic Organ Prolapse Symptoms:                  She Denies a feeling of a bulge the vaginal area.   Bowel Symptom: Bowel movements: every day or every 2 days Stool consistency: soft  Straining: yes,  occasionally Splinting: no.  Incomplete evacuation: no.  She Denies accidental bowel leakage / fecal incontinence Bowel regimen: none  Sexual Function Sexually active: no.   Pelvic Pain Denies pelvic pain    Past Medical History:  Past Medical History:  Diagnosis Date   Abnormal antibody titer    Anxiety and depression    Arthritis    Asthma    Cystocele    Depression    Diabetes ([place])    DVT (deep venous thrombosis) (HCC)    Righ calf   Dyspnea    11/08/2018 - "more now due to lack of exercise"   Dysrhythmia    afib   Edema    GERD (gastroesophageal reflux disease)    Heart murmur    Echocardiogram June 2015: Mild MR (possible vegetation seen on TTE, not seen on TEE), normal LV size with moderate concentric LVH. Normal function EF 60-65%. Normal diastolic function. Mild LA dilation.   History of IBS    History of kidney stones    History of methicillin resistant staphylococcus aureus (MRSA) 2008   Hyperlipidemia    Hypertension    Hypothyroidism    Insomnia    Kidney stone    kidney stones with lithotripsy .  [place] Kid[facility]- "adrenal glands"   Neuropathy involving both lower extremities    Obesity, Class II, BMI 35-39.9, with comorbidity    Paroxysmal atrial fibrillation ([place]) 2007   In June 2015, Cardiac Event Monitor: Mostly SR/sinus arrhythmia with PVCs that are frequent. Short  bursts of A. fib lasting several minutes;; CHA2DS2-VASc Score = 5 (age, Female, PVD, DM, HTN)   Peripheral vascular disease ([place])    Sleep apnea    use C-PAP   Urinary incontinence    Venous stasis dermatitis of both lower extremities      Past Surgical History:   Past Surgical History:  Procedure Laterality Date   ANKLE SURGERY Right    APPLICATION OF WOUND VAC Left 06/27/2015   Procedure: APPLICATION OF WOUND VAC ( POSSIBLE ) ;  Surgeon: [name], MD;  Location: [facility] ORS;  Service: Vascular;  Laterality: Left;   APPLICATION OF WOUND VAC Left 09/25/2016   Procedure: APPLICATION  OF WOUND VAC;  Surgeon: [name], DPM;  Location: [facility];  Service: Podiatry;  Laterality: Left;   arm surgery     right     CHOLECYSTECTOMY     COLONOSCOPY     COLONOSCOPY WITH PROPOFOL N/A 01/11/2017   Procedure: COLONOSCOPY WITH PROPOFOL;  Surgeon: [name], MD;  Location[facility] END[facility]COPY;  Service: Endoscopy;  Laterality: N/A;   COLONOSCOPY WITH PROPOFOL N/A 02/22/2017   Procedure: COLONOSCOPY WITH PROPOFOL;  Surgeon: [name], MD;  Location: [facility] ENDOSCOPY;  Service: Endoscopy;  Laterality: N/A;   COLONOSCOPY WITH PROPOFOL N/A 05/31/2017   Procedure: COLONOSCOPY WITH PROPOFOL;  Surgeon: [name], MD;  Location: [facility] ENDOSCOPY;  Service: Endoscopy;  Laterality: N/A;   ESOPHAGOGASTRODUODENOSCOPY (EGD) WITH PROPOFOL N/A 01/11/2017   Procedure: ESOPHAGOGASTRODUODENOSCOPY (EGD) WITH PROPOFOL;  Surgeon: [name], MD;  Location[facility];  Service: Endoscopy;  Laterality: N/A;   ESOPHAGOGASTRODUODENOSCOPY (EGD) WITH PROPOFOL N/A 10/25/2018   Procedure: ESOPHAGOGASTRODUODENOSCOPY (EGD) WITH PROPOFOL;  Surgeon: [name], MD;  Location: [facility] ENDOSCOPY;  Service: Endoscopy;  Laterality: N/A;   ESOPHAGOGASTRODUODENOSCOPY (EGD) WITH PROPOFOL N/A 11/29/2019   Procedure: ESOPHAGOGASTRODUODENOSCOPY (EGD) WITH PROPOFOL;  Surgeon: Toledo, Benay Pike, MD;  Location: [facility] ENDOSCOPY;  Service: Gastroenterology;  Laterality: N/A;   HERNIA REPAIR     umbilical   HIATAL HERNIA REPAIR     I & D EXTREMITY Left 06/27/2015   Procedure: IRRIGATION AND DEBRIDEMENT EXTREMITY            ( CALF HEMATOMA ) POSSIBLE WOUND VAC;  Surgeon: [name], MD;  Location: [facility];  Service: Vascular;  Laterality: Left;   INCISION AND DRAINAGE OF WOUND Left 09/25/2016   Procedure: IRRIGATION AND DEBRIDEMENT - PARTIAL RESECTION OF ACHILLES TENDON WITH WOUND VAC APPLICATION;  Surgeon: [name], DPM;  Location: [facility];  Service: Podiatry;  Laterality: Left;   INCISION  AND DRAINAGE OF WOUND Left 11/09/2018   Procedure: Excision of left foot wound with ACell placement;  Surgeon: Wallace Going, DO;  Location: [place];  Service: Plastics;  Laterality: Left;   IRRIGATION AND DEBRIDEMENT ABSCESS Left 09/07/2016   Procedure: IRRIGATION AND DEBRIDEMENT ABSCESS LEFT HEEL;  Surgeon: [name], DPM;  Location: [facility];  Service: Podiatry;  Laterality: Left;   JOINT REPLACEMENT  2013   left knee replacement   KIDNEY SURGERY Right    kidney stones   LITHOTRIPSY     LOWER EXTREMITY ANGIOGRAPHY Left 04/04/2018   Procedure: LOWER EXTREMITY ANGIOGRAPHY;  Surgeon: [name], MD;  Location: [facility];  Service: Cardiovascular;  Laterality: Left;   NM GATED MYOVIEW [facility] HX)  February 2017   Likely breast attenuation. LOW RISK study. Normal EF 55-60%.   OTHER SURGICAL HISTORY     08/2016 or 09/2016  surgery on achilles tenso h/o staph infection heal. Dr. [name]   removal of left hematoma Left    leg   REVERSE SHOULDER ARTHROPLASTY Right 10/08/2015   Procedure: REVERSE SHOULDER ARTHROPLASTY;  Surgeon: [name], MD;  Location: [facility] ORS;  Service: Orthopedics;  Laterality: Right;   SLEEVE GASTROPLASTY     TONSILLECTOMY     TOTAL KNEE ARTHROPLASTY Left    TOTAL SHOULDER REPLACEMENT Right    TRANSESOPHAGEAL ECHOCARDIOGRAM  08/08/2013   Mild LVH, EF 60-65%. Moderate LA dilation and mild RA dilation. Mild MR with no evidence of stenosis and no evidence of endocarditis. A false chordae is noted.   TRANSTHORACIC ECHOCARDIOGRAM  08/03/2013   Mild-Moderate concentric LVH, EF 60-65%. Normal diastolic function. Mild LA dilation. Mild MR with possible vegetation  - not confirmed on TEE    ULNAR NERVE TRANSPOSITION Right 08/06/2016   Procedure: ULNAR NERVE DECOMPRESSION/TRANSPOSITION;  Surgeon: [name], MD;  Location: [facility] ORS;  Service: Orthopedics;  Laterality: Right;   UPPER GI ENDOSCOPY     VAGINAL HYSTERECTOMY       Past OB/GYN History: OB  History  Gravida Para Term Preterm AB Living  '5       2 3  '$ SAB IAB Ectopic Multiple Live Births  2       3    # Outcome Date GA Lbr Len/2nd Weight Sex Delivery Anes PTL Lv  5 Gravida           4 Gravida           3 Gravida           2 SAB           1 SAB             Vaginal deliveries: 3 Menopausal: Yes, Denies vaginal bleeding since menopause   Medications: She has a current medication list which includes the following prescription(s): acetaminophen, albuterol, alprazolam, apixaban, bupropion, calcium-vitamin d, vitamin d3, clobetasol cream, colchicine, collagenase, diclofenac sodium, doxycycline, duloxetine, ergocalciferol, estradiol, toviaz, advair hfa, folic acid, hydrocodone-acetaminophen, [START ON 12/13/2020] hydrocodone-acetaminophen, [START ON 01/12/2021] hydrocodone-acetaminophen, levocetirizine, levothyroxine, metformin, metoprolol succinate, tub transfer board, montelukast, multivitamin with minerals, mupirocin ointment, ondansetron, oxybutynin, pantoprazole, pregabalin, rosuvastatin, sitagliptin, sucralfate, thiamine, and cyanocobalamin.   Allergies: Patient is allergic to morphine and related, penicillins, ambien [zolpidem], aspirin, and oxytetracycline.   Social History:  Social History   Tobacco Use   Smoking status: Never   Smokeless tobacco: Never  Vaping Use   Vaping Use: Never used  Substance Use Topics   Alcohol use: No    Alcohol/week: 0.0 standard drinks   Drug use: Never    Relationship status: married   Family History:   Family History  Problem Relation Age of Onset   Skin cancer Father    Diabetes Father    Hypertension Father    Peripheral vascular disease Father    Cancer Father    Cerebral aneurysm Father    Alcohol abuse Father    Varicose Veins Mother    Kidney disease Mother    Arthritis Mother    Mental illness Sister    Cancer Maternal Aunt        breast   Breast cancer Maternal Aunt    Arthritis Maternal Grandmother     Hypertension Maternal Grandmother    Diabetes Maternal Grandmother    Arthritis Maternal Grandfather    Heart disease Maternal Grandfather    Hypertension Maternal Grandfather    Arthritis Paternal Grandmother  Hypertension Paternal Grandmother    Diabetes Paternal Grandmother    Arthritis Paternal Grandfather    Hypertension Paternal Grandfather    Bladder Cancer Neg Hx    Kidney cancer Neg Hx      Review of Systems: Review of Systems  Constitutional:  Positive for weight loss. Negative for fever and malaise/fatigue.  Respiratory:  Positive for wheezing. Negative for cough and shortness of breath.   Cardiovascular:  Positive for palpitations. Negative for chest pain and leg swelling.  Gastrointestinal:  Negative for abdominal pain and blood in stool.  Genitourinary:  Negative for dysuria.  Musculoskeletal:  Negative for myalgias.  Skin:  Negative for rash.  Neurological:  Negative for dizziness and headaches.  Endo/Heme/Allergies:  Does not bruise/bleed easily.  Psychiatric/Behavioral:  Positive for depression. The patient is nervous/anxious.     OBJECTIVE Physical Exam: Vitals:   12/06/20 1443  BP: 137/78  Pulse: 61  Weight: 260 lb (117.9 kg)  Height: '5\' 6"'$  (1.676 m)    Physical Exam Constitutional:      General: She is not in acute distress. Pulmonary:     Effort: Pulmonary effort is normal.  Abdominal:     General: There is no distension.     Palpations: Abdomen is soft.     Tenderness: There is no abdominal tenderness. There is no rebound.     Comments: obese  Musculoskeletal:        General: No swelling. Normal range of motion.  Skin:    General: Skin is warm and dry.     Findings: No rash.  Neurological:     Mental Status: She is alert and oriented to person, place, and time.  Psychiatric:        Mood and Affect: Mood normal.        Behavior: Behavior normal.     GU / Detailed Urogynecologic Evaluation:  Pelvic Exam: Normal external female  genitalia; Bartholin's and Skene's glands normal in appearance; urethral meatus normal in appearance, no urethral masses or discharge.   CST: positive- large leakage   s/p hysterectomy: Speculum exam reveals normal vaginal mucosa with  atrophy and normal vaginal cuff.  Adnexa no mass, fullness, tenderness.     Pelvic floor strength I/V  Pelvic floor musculature: Right levator non-tender, Right obturator non-tender, Left levator non-tender, Left obturator non-tender  POP-Q:   POP-Q  -3                                            Aa   -3                                           Ba  -6                                              C   3                                            Gh  4  Pb  7                                            tvl   -2                                            Ap  -2                                            Bp                                                 D     Rectal Exam:  Normal external rectum  Pessary fitting: A #0 incontinence ring with support was placed. It was comfortable, fit well, and stayed in placed with strong cough, valsalva.  Lot # [id number]    Post-Void Residual (PVR) by Bladder Scan: In order to evaluate bladder emptying, we discussed obtaining a postvoid residual and she agreed to this procedure. Unable to void to give sample.   Procedure: The ultrasound unit was placed on the patient's abdomen in the suprapubic region after the patient had voided. A PVR of 65 ml was obtained by bladder scan.  Laboratory Results: Unable to void to give sample.   ASSESSMENT AND PLAN Ms. Gordner is a 70 y.o. with:  1. SUI (stress urinary incontinence, female)   2. Overactive bladder     SUI - demonstrated large leakage with cough today -For treatment of stress urinary incontinence,  non-surgical options include expectant management, weight loss, physical therapy, as well as a pessary.   - A  #0 incontinence pessary was inserted today. She will keep it in place until her next visit.   2. OAB -We discussed the symptoms of overactive bladder (OAB), which include urinary urgency, urinary frequency, nocturia, with or without urge incontinence.  While we do not know the exact etiology of OAB, several treatment options exist. We discussed management including behavioral therapy (decreasing bladder irritants, urge suppression strategies, timed voids, bladder retraining), physical therapy, medication; for refractory cases posterior tibial nerve stimulation, sacral neuromodulation, and intravesical botulinum toxin injection.  - She has tried several medications without success. She is interested in proceeding with PTNS.   Return for PTNS and pessary check  Jaquita Folds, MD   Medical Decision Making:  - Reviewed/ ordered a clinical laboratory test - Review and summation of prior records

## 2020-12-09 DIAGNOSIS — I4819 Other persistent atrial fibrillation: Secondary | ICD-10-CM | POA: Diagnosis not present

## 2020-12-09 DIAGNOSIS — I69320 Aphasia following cerebral infarction: Secondary | ICD-10-CM | POA: Diagnosis not present

## 2020-12-09 DIAGNOSIS — I152 Hypertension secondary to endocrine disorders: Secondary | ICD-10-CM | POA: Diagnosis not present

## 2020-12-09 DIAGNOSIS — J449 Chronic obstructive pulmonary disease, unspecified: Secondary | ICD-10-CM | POA: Diagnosis not present

## 2020-12-09 DIAGNOSIS — E1142 Type 2 diabetes mellitus with diabetic polyneuropathy: Secondary | ICD-10-CM | POA: Diagnosis not present

## 2020-12-09 DIAGNOSIS — I872 Venous insufficiency (chronic) (peripheral): Secondary | ICD-10-CM | POA: Diagnosis not present

## 2020-12-09 DIAGNOSIS — G8194 Hemiplegia, unspecified affecting left nondominant side: Secondary | ICD-10-CM | POA: Diagnosis not present

## 2020-12-09 DIAGNOSIS — I69319 Unspecified symptoms and signs involving cognitive functions following cerebral infarction: Secondary | ICD-10-CM | POA: Diagnosis not present

## 2020-12-09 DIAGNOSIS — E1151 Type 2 diabetes mellitus with diabetic peripheral angiopathy without gangrene: Secondary | ICD-10-CM | POA: Diagnosis not present

## 2020-12-09 DIAGNOSIS — E1159 Type 2 diabetes mellitus with other circulatory complications: Secondary | ICD-10-CM | POA: Diagnosis not present

## 2020-12-10 DIAGNOSIS — G4733 Obstructive sleep apnea (adult) (pediatric): Secondary | ICD-10-CM | POA: Diagnosis not present

## 2020-12-12 DIAGNOSIS — G8194 Hemiplegia, unspecified affecting left nondominant side: Secondary | ICD-10-CM | POA: Diagnosis not present

## 2020-12-12 DIAGNOSIS — J449 Chronic obstructive pulmonary disease, unspecified: Secondary | ICD-10-CM | POA: Diagnosis not present

## 2020-12-12 DIAGNOSIS — I872 Venous insufficiency (chronic) (peripheral): Secondary | ICD-10-CM | POA: Diagnosis not present

## 2020-12-12 DIAGNOSIS — E1142 Type 2 diabetes mellitus with diabetic polyneuropathy: Secondary | ICD-10-CM | POA: Diagnosis not present

## 2020-12-12 DIAGNOSIS — E1151 Type 2 diabetes mellitus with diabetic peripheral angiopathy without gangrene: Secondary | ICD-10-CM | POA: Diagnosis not present

## 2020-12-12 DIAGNOSIS — E1159 Type 2 diabetes mellitus with other circulatory complications: Secondary | ICD-10-CM | POA: Diagnosis not present

## 2020-12-12 DIAGNOSIS — I152 Hypertension secondary to endocrine disorders: Secondary | ICD-10-CM | POA: Diagnosis not present

## 2020-12-12 DIAGNOSIS — G4733 Obstructive sleep apnea (adult) (pediatric): Secondary | ICD-10-CM | POA: Diagnosis not present

## 2020-12-12 DIAGNOSIS — I69320 Aphasia following cerebral infarction: Secondary | ICD-10-CM | POA: Diagnosis not present

## 2020-12-12 DIAGNOSIS — I69319 Unspecified symptoms and signs involving cognitive functions following cerebral infarction: Secondary | ICD-10-CM | POA: Diagnosis not present

## 2020-12-12 DIAGNOSIS — I4819 Other persistent atrial fibrillation: Secondary | ICD-10-CM | POA: Diagnosis not present

## 2020-12-13 ENCOUNTER — Encounter: Payer: Medicare HMO | Admitting: Physician Assistant

## 2020-12-13 ENCOUNTER — Other Ambulatory Visit: Payer: Self-pay

## 2020-12-13 DIAGNOSIS — L97812 Non-pressure chronic ulcer of other part of right lower leg with fat layer exposed: Secondary | ICD-10-CM | POA: Diagnosis not present

## 2020-12-13 DIAGNOSIS — E11621 Type 2 diabetes mellitus with foot ulcer: Secondary | ICD-10-CM | POA: Diagnosis not present

## 2020-12-13 DIAGNOSIS — Z86718 Personal history of other venous thrombosis and embolism: Secondary | ICD-10-CM | POA: Diagnosis not present

## 2020-12-13 DIAGNOSIS — I10 Essential (primary) hypertension: Secondary | ICD-10-CM | POA: Diagnosis not present

## 2020-12-13 DIAGNOSIS — E11622 Type 2 diabetes mellitus with other skin ulcer: Secondary | ICD-10-CM | POA: Diagnosis not present

## 2020-12-13 DIAGNOSIS — L97522 Non-pressure chronic ulcer of other part of left foot with fat layer exposed: Secondary | ICD-10-CM | POA: Diagnosis not present

## 2020-12-13 DIAGNOSIS — Z7901 Long term (current) use of anticoagulants: Secondary | ICD-10-CM | POA: Diagnosis not present

## 2020-12-13 DIAGNOSIS — I872 Venous insufficiency (chronic) (peripheral): Secondary | ICD-10-CM | POA: Diagnosis not present

## 2020-12-13 DIAGNOSIS — L97826 Non-pressure chronic ulcer of other part of left lower leg with bone involvement without evidence of necrosis: Secondary | ICD-10-CM | POA: Diagnosis not present

## 2020-12-13 NOTE — Progress Notes (Signed)
DEBRAH, GRANDERSON ([id number]) Visit Report for 12/13/2020 Arrival Information Details Patient Name: [name], [name] Date of Service: 12/13/2020 1:45 PM Medical Record Number: [id number] Patient Account Number: [id number] Date of Birth/Sex: 15-Nov-1950 (70 y.o. F) Treating RN: Donnamarie Poag Primary Care Marshe Shrestha: [name], Olivia Mackie Other Clinician: Referring Linley Moskal: [name], Olivia Mackie Treating Kerby Borner/Extender: Skipper Cliche in Treatment: 24 Visit Information History Since Last Visit Added or deleted any medications: No Patient Arrived: Wheel Chair Had a fall or experienced change in No Arrival Time: 13:53 activities of daily living that may affect Accompanied By: self risk of falls: Transfer Assistance: EasyPivot Patient Lift Hospitalized since last visit: No Patient Identification Verified: Yes Has Dressing in Place as Prescribed: Yes Secondary Verification Process Completed: Yes Pain Present Now: Yes Patient Has Alerts: Yes Patient Alerts: Patient on Blood Thinner Eliquis Diabetic II 06/2020 ABI L)1.22 R)1.14 Electronic Signature(s) Signed: 12/13/2020 3:42:23 PM By: Donnamarie Poag Entered By: Donnamarie Poag on 12/13/2020 13:56:45 Penman, [name] ([id number]) -------------------------------------------------------------------------------- Clinic Level of Care Assessment Details Patient Name: [name] Date of Service: 12/13/2020 1:45 PM Medical Record Number: [id number] Patient Account Number: [id number] Date of Birth/Sex: 04/10/1950 (70 y.o. F) Treating RN: Donnamarie Poag Primary Care Agastya Meister: [name], Olivia Mackie Other Clinician: Referring Syrianna Schillaci: [name], Olivia Mackie Treating Tarini Carrier/Extender: Skipper Cliche in Treatment: 24 Clinic Level of Care Assessment Items TOOL 1 Quantity Score _0  - Use when EandM and Procedure is performed on INITIAL visit 0 ASSESSMENTS - Nursing Assessment / Reassessment _1  - General Physical Exam (combine w/ comprehensive  assessment (listed just below) when performed on new 0 pt. evals) _2  - 0 Comprehensive Assessment (HX, ROS, Risk Assessments, Wounds Hx, etc.) ASSESSMENTS - Wound and Skin Assessment / Reassessment _3  - Dermatologic / Skin Assessment (not related to wound area) 0 ASSESSMENTS - Ostomy and/or Continence Assessment and Care _4  - Incontinence Assessment and Management 0 _5  - 0 Ostomy Care Assessment and Management (repouching, etc.) PROCESS - Coordination of Care _6  - Simple Patient / Family Education for ongoing care 0 _7  - 0 Complex (extensive) Patient / Family Education for ongoing care _8  - 0 Staff obtains Programmer, systems, Records, Test Results / Process Orders _9  - 0 Staff telephones HHA, Nursing Homes / Clarify orders / etc _10  - 0 Routine Transfer to another Facility (non-emergent condition) _11  - 0 Routine Hospital Admission (non-emergent condition) _12  - 0 New Admissions / Biomedical engineer / Ordering NPWT, Apligraf, etc. _13  - 0 Emergency Hospital Admission (emergent condition) PROCESS - Special Needs _14  - Pediatric / Minor Patient Management 0 _15  - 0 Isolation Patient Management _16  - 0 Hearing / Language / Visual special needs _17  - 0 Assessment of Community assistance (transportation, D/C planning, etc.) _18  - 0 Additional assistance / Altered mentation _19  - 0 Support Surface(s) Assessment (bed, cushion, seat, etc.) INTERVENTIONS - Miscellaneous _20  - External ear exam 0 _21  - 0 Patient Transfer (multiple staff / Civil Service fast streamer / Similar devices) _22  - 0 Simple Staple / Suture removal (25 or less) _23  - 0 Complex Staple / Suture removal (26 or more) _24  - 0 Hypo/Hyperglycemic Management (do not check if billed separately) _25  - 0 Ankle / Brachial Index (ABI) - do not check if billed separately Has the patient been seen at the hospital within the last three years: Yes Total Score: 0 Level Of Care: ____ [name] ([id number]) Electronic Signature(s) Signed:  12/13/2020 3:42:23 PM By: Donnamarie Poag Entered By: Donnamarie Poag on 12/13/2020 14:34:16 Esco, Monigue B. ([id number]) -------------------------------------------------------------------------------- Compression Therapy Details Patient Name:  [name] Date of Service: 12/13/2020 1:45 PM Medical Record Number: [id number] Patient Account Number: [id number] Date of Birth/Sex: Jun 24, 1950 (70 y.o. F) Treating RN: Donnamarie Poag Primary Care Sydney Azure: [name], Olivia Mackie Other Clinician: Referring Teauna Dubach: [name], Olivia Mackie Treating Sharita Bienaime/Extender: Skipper Cliche in Treatment: 24 Compression Therapy Performed for Wound Assessment: Wound #13 Right,Midline Lower Leg Performed By: Junius Argyle, RN Compression Type: Three Layer Post Procedure Diagnosis Same as Pre-procedure Electronic Signature(s) Signed: 12/13/2020 3:42:23 PM By: Donnamarie Poag Entered By: Donnamarie Poag on 12/13/2020 14:22:34 [name] ([id number]) -------------------------------------------------------------------------------- Encounter Discharge Information Details Patient Name: [name] Date of Service: 12/13/2020 1:45 PM Medical Record Number: [id number] Patient Account Number: [id number] Date of Birth/Sex: Jan 16, 1951 (70 y.o. F) Treating RN: Donnamarie Poag Primary Care Tava Peery: [name], Olivia Mackie Other Clinician: Referring Latrease Kunde: [name], Olivia Mackie Treating Signe Tackitt/Extender: Skipper Cliche in Treatment: 24 Encounter Discharge Information Items Post Procedure Vitals Discharge Condition: Stable Temperature (F): 98.3 Ambulatory Status: Wheelchair Pulse (bpm): 63 Discharge Destination: Home Respiratory Rate (breaths/min): 18 Transportation: Private Auto Blood Pressure (mmHg): 159/76 Accompanied By: self Schedule Follow-up Appointment: Yes Clinical Summary of Care: Electronic Signature(s) Signed: 12/13/2020 3:42:23 PM By: Donnamarie Poag Entered By: Donnamarie Poag on 12/13/2020  14:35:38 Bryngelson, [name] ([id number]) -------------------------------------------------------------------------------- Lower Extremity Assessment Details Patient Name: [name] Date of Service: 12/13/2020 1:45 PM Medical Record Number: [id number] Patient Account Number: [id number] Date of Birth/Sex: Jun 27, 1950 (70 y.o. F) Treating RN: Donnamarie Poag Primary Care Sharae Zappulla: [name], Olivia Mackie Other Clinician: Referring Justine Dines: [name], Olivia Mackie Treating Sherilynn Dieu/Extender: Skipper Cliche in Treatment: 24 Edema Assessment Assessed: [Left: Yes] [Right: Yes] Edema: [Left: Yes] [Right: Yes] Calf Left: Right: Point of Measurement: 31 cm From Medial Instep 32 cm 32.5 cm Ankle Left: Right: Point of Measurement: 11 cm From Medial Instep 16.5 cm 17 cm Knee To Floor Left: Right: From Medial Instep 40 cm 40 cm Vascular Assessment Pulses: Dorsalis Pedis Palpable: [Left:Yes] [Right:Yes] Electronic Signature(s) Signed: 12/13/2020 3:42:23 PM By: Donnamarie Poag Entered By: Donnamarie Poag on 12/13/2020 14:10:55 Maggio, [name] ([id number]) -------------------------------------------------------------------------------- Multi Wound Chart Details Patient Name: [name] Date of Service: 12/13/2020 1:45 PM Medical Record Number: [id number] Patient Account Number: [id number] Date of Birth/Sex: January 02, 1951 (70 y.o. F) Treating RN: Donnamarie Poag Primary Care Tanganika Barradas: [name], Olivia Mackie Other Clinician: Referring Charlaine Utsey: [name], Olivia Mackie Treating Prabhjot Piscitello/Extender: Skipper Cliche in Treatment: 24 Vital Signs Height(in): 65.5 Pulse(bpm): 46 Weight(lbs): 240 Blood Pressure(mmHg): 159/76 Body Mass Index(BMI): 39 Temperature(F): 98.3 Respiratory Rate(breaths/min): 18 Photos: [N/A:N/A] Wound Location: Right, Midline Lower Leg Left, Distal, Anterior Metatarsal N/A head third Wounding Event: Gradually Appeared Pressure Injury N/A Primary Etiology: Venous Leg  Ulcer Diabetic Wound/Ulcer of the Lower N/A Extremity Comorbid History: Asthma, Pneumothorax, Asthma, Pneumothorax, N/A Arrhythmia, Hypertension, Type II Arrhythmia, Hypertension, Type II Diabetes, Gout, Osteoarthritis, Diabetes, Gout, Osteoarthritis, Neuropathy Neuropathy Date Acquired: 10/21/2020 10/31/2020 N/A Weeks of Treatment: 6 2 N/A Wound Status: Open Open N/A Measurements L x W x D (cm) 1x0.5x0.1 0.7x0.7x0.1 N/A Area (cm) : 0.393 0.385 N/A Volume (cm) : 0.039 0.038 N/A % Reduction in Area: 91.60% 51.00% N/A % Reduction in Volume: 91.60% 51.90% N/A Classification: Full Thickness Without Exposed Grade 2 N/A Support Structures Exudate Amount: Medium Medium N/A Exudate Type: Serosanguineous Serosanguineous N/A Exudate Color: red, brown red, brown N/A Wound Margin: Flat and Intact N/A N/A Granulation Amount: Large (67-100%) Small (1-33%) N/A Granulation Quality: Red, Pink Red, Pink N/A Necrotic Amount: Small (1-33%) Large (67-100%) N/A Exposed Structures: Fat Layer (Subcutaneous Tissue): Fat Layer (Subcutaneous Tissue): N/A Yes  Yes Fascia: No Fascia: No Tendon: No Tendon: No Muscle: No Muscle: No Joint: No Joint: No Bone: No Bone: No Epithelialization: None N/A N/A Treatment Notes Electronic Signature(s) Signed: 12/13/2020 3:42:23 PM By: Donnamarie Poag Entered By: Donnamarie Poag on 12/13/2020 14:22:13 KENDRICK, REMIGIO ([id number], HAYMAKER ([id number]) -------------------------------------------------------------------------------- [place] Details Patient Name: [name] Date of Service: 12/13/2020 1:45 PM Medical Record Number: [id number] Patient Account Number: [id number] Date of Birth/Sex: October 30, 1950 (70 y.o. F) Treating RN: Donnamarie Poag Primary Care Bruchy Mikel: [name], Olivia Mackie Other Clinician: Referring Mcarthur Ivins: [name], Olivia Mackie Treating Promiss Labarbera/Extender: Skipper Cliche in Treatment: 24 Active Inactive Venous Leg  Ulcer Nursing Diagnoses: Potential for venous Insuffiency (use before diagnosis confirmed) Goals: Patient will maintain optimal edema control Date Initiated: 10/30/2020 Date Inactivated: 11/29/2020 Target Resolution Date: 11/21/2020 Goal Status: Met Patient/caregiver will verbalize understanding of disease process and disease management Date Initiated: 10/30/2020 Date Inactivated: 11/29/2020 Target Resolution Date: 11/21/2020 Goal Status: Met Verify adequate tissue perfusion prior to therapeutic compression application Date Initiated: 10/30/2020 Target Resolution Date: 11/21/2020 Goal Status: Active Interventions: Assess peripheral edema status every visit. Compression as ordered Provide education on venous insufficiency Treatment Activities: Therapeutic compression applied : 10/30/2020 Notes: Wound/Skin Impairment Nursing Diagnoses: Knowledge deficit related to ulceration/compromised skin integrity Goals: Patient/caregiver will verbalize understanding of skin care regimen Date Initiated: 06/28/2020 Date Inactivated: 08/16/2020 Target Resolution Date: 07/28/2020 Goal Status: Met Ulcer/skin breakdown will have a volume reduction of 30% by week 4 Date Initiated: 06/28/2020 Date Inactivated: 08/16/2020 Target Resolution Date: 07/28/2020 Goal Status: Met Ulcer/skin breakdown will have a volume reduction of 50% by week 8 Date Initiated: 06/28/2020 Date Inactivated: 09/13/2020 Target Resolution Date: 08/28/2020 Goal Status: Met Ulcer/skin breakdown will have a volume reduction of 80% by week 12 Date Initiated: 06/28/2020 Target Resolution Date: 09/27/2020 Goal Status: Active Ulcer/skin breakdown will heal within 14 weeks Date Initiated: 06/28/2020 Target Resolution Date: 10/28/2020 Goal Status: Active Interventions: Assess patient/caregiver ability to obtain necessary supplies Assess patient/caregiver ability to perform ulcer/skin care regimen upon admission and as needed Assess ulceration(s) every  visit KAITLYNE, FRIEDHOFF ([id number]) Notes: Electronic Signature(s) Signed: 12/13/2020 3:42:23 PM By: Donnamarie Poag Entered By: Donnamarie Poag on 12/13/2020 14:21:56 Delawder, [name] ([id number]) -------------------------------------------------------------------------------- Pain Assessment Details Patient Name: [name] Date of Service: 12/13/2020 1:45 PM Medical Record Number: [id number] Patient Account Number: [id number] Date of Birth/Sex: Oct 08, 1950 (70 y.o. F) Treating RN: Donnamarie Poag Primary Care Asjah Rauda: [name], Olivia Mackie Other Clinician: Referring Yoshie Kosel: [name], Olivia Mackie Treating Monya Kozakiewicz/Extender: Skipper Cliche in Treatment: 24 Active Problems Location of Pain Severity and Description of Pain Patient Has Paino Yes Site Locations Pain Location: Pain in Ulcers Rate the pain. Current Pain Level: 5 Pain Management and Medication Current Pain Management: Electronic Signature(s) Signed: 12/13/2020 3:42:23 PM By: Donnamarie Poag Entered By: Donnamarie Poag on 12/13/2020 13:59:52 Cerrone, [name] ([id number]) -------------------------------------------------------------------------------- Patient/Caregiver Education Details Patient Name: [name] Date of Service: 12/13/2020 1:45 PM Medical Record Number: [id number] Patient Account Number: [id number] Date of Birth/Gender: 07/22/1950 (70 y.o. F) Treating RN: Donnamarie Poag Primary Care Physician: [name], Olivia Mackie Other Clinician: Referring Physician: [name], Olivia Mackie Treating Physician/Extender: Skipper Cliche in Treatment: 24 Education Assessment Education Provided To: Patient Education Topics Provided Basic Hygiene: Venous: Wound Debridement: Wound/Skin Impairment: Electronic Signature(s) Signed: 12/13/2020 3:42:23 PM By: Donnamarie Poag Entered By: Donnamarie Poag on 12/13/2020 14:34:44 Dosher, [name]  ([id number]) -------------------------------------------------------------------------------- Wound Assessment Details Patient Name: [name] Date of Service: 12/13/2020 1:45 PM Medical Record Number: [id number] Patient Account Number: [id number]  Date of Birth/Sex: 25-Nov-1950 (70 y.o. F) Treating RN: Donnamarie Poag Primary Care Krystina Strieter: [name], Olivia Mackie Other Clinician: Referring Teya Otterson: [name], Olivia Mackie Treating Lauriana Denes/Extender: Skipper Cliche in Treatment: 24 Wound Status Wound Number: 13 Primary Venous Leg Ulcer Etiology: Wound Location: Right, Midline Lower Leg Wound Open Wounding Event: Gradually Appeared Status: Date Acquired: 10/21/2020 Comorbid Asthma, Pneumothorax, Arrhythmia, Hypertension, Type Weeks Of Treatment: 6 History: II Diabetes, Gout, Osteoarthritis, Neuropathy Clustered Wound: No Photos Wound Measurements Length: (cm) 1 Width: (cm) 0.5 Depth: (cm) 0.1 Area: (cm) 0.393 Volume: (cm) 0.039 % Reduction in Area: 91.6% % Reduction in Volume: 91.6% Epithelialization: None Tunneling: No Undermining: No Wound Description Classification: Full Thickness Without Exposed Support Structu Wound Margin: Flat and Intact Exudate Amount: Medium Exudate Type: Serosanguineous Exudate Color: red, brown res Foul Odor After Cleansing: No Slough/Fibrino Yes Wound Bed Granulation Amount: Large (67-100%) Exposed Structure Granulation Quality: Red, Pink Fascia Exposed: No Necrotic Amount: Small (1-33%) Fat Layer (Subcutaneous Tissue) Exposed: Yes Necrotic Quality: Adherent Slough Tendon Exposed: No Muscle Exposed: No Joint Exposed: No Bone Exposed: No Treatment Notes Wound #13 (Lower Leg) Wound Laterality: Right, Midline Cleanser Soap and Water Discharge Instruction: Gently cleanse wound with antibacterial soap, rinse and pat dry prior to dressing wounds Wound Cleanser Soo, Britania B. ([id number]) Discharge Instruction: Wash your hands with  soap and water. Remove old dressing, discard into plastic bag and place into trash. Cleanse the wound with Wound Cleanser prior to applying a clean dressing using gauze sponges, not tissues or cotton balls. Do not scrub or use excessive force. Pat dry using gauze sponges, not tissue or cotton balls. Peri-Wound Care Topical Primary Dressing Hydrofera Blue Ready Transfer Foam, 2.5x2.5 (in/in) Discharge Instruction: Apply Hydrofera Blue Ready to wound bed as directed Secondary Dressing ABD Pad 5x9 (in/in) Discharge Instruction: Cover with ABD pad Secured With Compression Wrap Profore Lite LF 3 Multilayer Compression [place] Discharge Instruction: Apply 3 multi-layer wrap as prescribed. Compression Stockings Add-Ons Electronic Signature(s) Signed: 12/13/2020 3:42:23 PM By: Donnamarie Poag Entered By: Donnamarie Poag on 12/13/2020 14:08:57 [name] ([id number]) -------------------------------------------------------------------------------- Wound Assessment Details Patient Name: [name] Date of Service: 12/13/2020 1:45 PM Medical Record Number: [id number] Patient Account Number: [id number] Date of Birth/Sex: 1950-12-06 (70 y.o. F) Treating RN: Donnamarie Poag Primary Care Britt Theard: [name], Olivia Mackie Other Clinician: Referring Ondre Salvetti: [name], Olivia Mackie Treating Cesia Orf/Extender: Skipper Cliche in Treatment: 24 Wound Status Wound Number: 16 Primary Diabetic Wound/Ulcer of the Lower Extremity Etiology: Wound Location: Left, Distal, Anterior Metatarsal head third Wound Open Wounding Event: Pressure Injury Status: Date Acquired: 10/31/2020 Notes: stated toenail was removed by podiatry doctor on Weeks Of Treatment: 2 11/08/20 Clustered Wound: No Comorbid Asthma, Pneumothorax, Arrhythmia, Hypertension, Type History: II Diabetes, Gout, Osteoarthritis, Neuropathy Photos Wound Measurements Length: (cm) 0.7 Width: (cm) 0.7 Depth: (cm) 0.1 Area: (cm)  0.385 Volume: (cm) 0.038 % Reduction in Area: 51% % Reduction in Volume: 51.9% Tunneling: No Undermining: No Wound Description Classification: Grade 2 Exudate Amount: Medium Exudate Type: Serosanguineous Exudate Color: red, brown Foul Odor After Cleansing: No Slough/Fibrino Yes Wound Bed Granulation Amount: Small (1-33%) Exposed Structure Granulation Quality: Red, Pink Fascia Exposed: No Necrotic Amount: Large (67-100%) Fat Layer (Subcutaneous Tissue) Exposed: Yes Necrotic Quality: Adherent Slough Tendon Exposed: No Muscle Exposed: No Joint Exposed: No Bone Exposed: Yes Treatment Notes Wound #16 (Metatarsal head third) Wound Laterality: Left, Anterior, Distal Cleanser Soap and Water Discharge Instruction: Gently cleanse wound with antibacterial soap, rinse and pat dry prior to dressing wounds Wound Cleanser Thrun, Miryam B. (  [id number]) Discharge Instruction: Wash your hands with soap and water. Remove old dressing, discard into plastic bag and place into trash. Cleanse the wound with Wound Cleanser prior to applying a clean dressing using gauze sponges, not tissues or cotton balls. Do not scrub or use excessive force. Pat dry using gauze sponges, not tissue or cotton balls. Peri-Wound Care Topical Primary Dressing Hydrofera Blue Ready Transfer Foam, 2.5x2.5 (in/in) Discharge Instruction: Apply Hydrofera Blue Ready to wound bed as directed Secondary Dressing ABD Pad 5x9 (in/in) Discharge Instruction: Cover with ABD pad or cut in half Secured With [place] Surgical Tape, 2x2 (in/yd) Kerlix Roll Sterile or Non-Sterile 6-ply 4.5x4 (yd/yd) Discharge Instruction: Apply Kerlix as directed Compression Wrap Compression Stockings Add-Ons Electronic Signature(s) Signed: 12/13/2020 3:42:23 PM By: Donnamarie Poag Entered By: Donnamarie Poag on 12/13/2020 14:29:32 Reitman, [name]  ([id number]) -------------------------------------------------------------------------------- Wound Assessment Details Patient Name: [name] Date of Service: 12/13/2020 1:45 PM Medical Record Number: [id number] Patient Account Number: [id number] Date of Birth/Sex: 1951-02-17 (70 y.o. F) Treating RN: Donnamarie Poag Primary Care Ellianna Ruest: [name], Olivia Mackie Other Clinician: Referring Jernie Schutt: [name], Olivia Mackie Treating Micha Dosanjh/Extender: Skipper Cliche in Treatment: 24 Wound Status Wound Number: 17 Primary Venous Leg Ulcer Etiology: Wound Location: Left Lower Leg Wound Open Wounding Event: Blister Status: Date Acquired: 12/13/2020 Comorbid Asthma, Pneumothorax, Arrhythmia, Hypertension, Type Weeks Of Treatment: 0 History: II Diabetes, Gout, Osteoarthritis, Neuropathy Clustered Wound: No Photos Wound Measurements Length: (cm) Width: (cm) Depth: (cm) Area: (cm) Volume: (cm) 0.5 % Reduction in Area: 0.7 % Reduction in Volume: 0.1 0.275 0.027 Wound Description Classification: Full Thickness Without Exposed Support Structures Exudate Amount: Medium Exudate Type: Serosanguineous Exudate Color: red, brown Foul Odor After Cleansing: No Slough/Fibrino Yes Wound Bed Granulation Amount: Large (67-100%) Exposed Structure Granulation Quality: Red, Pink Fascia Exposed: No Necrotic Amount: Small (1-33%) Fat Layer (Subcutaneous Tissue) Exposed: Yes Necrotic Quality: Adherent Slough Tendon Exposed: No Muscle Exposed: No Joint Exposed: No Bone Exposed: No Treatment Notes Wound #17 (Lower Leg) Wound Laterality: Left Cleanser Normal Saline Discharge Instruction: Wash your hands with soap and water. Remove old dressing, discard into plastic bag and place into trash. Cleanse the wound with Normal Saline prior to applying a clean dressing using gauze sponges, not tissues or cotton balls. Do not scrub or use excessive force. Pat dry using gauze sponges, not tissue  or cotton balls. ALEYZA, SALMI ([id number]) Peri-Wound Care Topical Primary Dressing Hydrofera Blue Ready Transfer Foam, 2.5x2.5 (in/in) Discharge Instruction: Apply Hydrofera Blue Ready to wound bed as directed Secondary Dressing Coverlet Latex-Free Fabric Adhesive Dressings Discharge Instruction: 1.5 x 2 Secured With Compression Wrap Compression Stockings Add-Ons Electronic Signature(s) Signed: 12/13/2020 3:42:23 PM By: Donnamarie Poag Entered By: Donnamarie Poag on 12/13/2020 14:27:35 H[place]en[place], [name] ([id number]) -------------------------------------------------------------------------------- Vitals Details Patient Name: [name] Date of Service: 12/13/2020 1:45 PM Medical Record Number: [id number] Patient Account Number: [id number] Date of Birth/Sex: 04/13/1950 (70 y.o. F) Treating RN: Donnamarie Poag Primary Care Sherrod Toothman: [name], Olivia Mackie Other Clinician: Referring [place] Kinyon: [name], Olivia Mackie Treating Antonette Hendricks/Extender: Skipper Cliche in Treatment: 24 Vital Signs Time Taken: 15:58 Temperature (F): 98.3 Height (in): 65.5 Pulse (bpm): 63 Weight (lbs): 240 Respiratory Rate (breaths/min): 18 Body Mass Index (BMI): 39.3 Blood Pressure (mmHg): 159/76 Reference Range: 80 - 120 mg / dl Electronic Signature(s) Signed: 12/13/2020 3:42:23 PM By: Donnamarie Poag Entered ByDonnamarie Poag on 12/13/2020 13:59:44

## 2020-12-13 NOTE — Progress Notes (Addendum)
[name], [name] ([id number]) Visit Report for 12/13/2020 Chief Complaint Document Details Patient Name: Dawn, [name] Date of Service: 12/13/2020 1:45 PM Medical Record Number: [id number] Patient Account Number: [id number] Date of Birth/Sex: 05-05-1950 (70 y.o. F) Treating RN: Donnamarie Poag Primary Care Provider: [name], Olivia Mackie Other Clinician: Referring Provider: [name], Olivia Mackie Treating Provider/Extender: Skipper Cliche in Treatment: 24 Information Obtained from: Patient Chief Complaint Right Leg Ulcer and Right 3rd Toe Ulcer Electronic Signature(s) Signed: 12/13/2020 2:07:48 PM By: Worthy Keeler PA-C Entered By: Worthy Keeler on 12/13/2020 14:07:48 [name] ([id number]) -------------------------------------------------------------------------------- Debridement Details Patient Name: [name] Date of Service: 12/13/2020 1:45 PM Medical Record Number: [id number] Patient Account Number: [id number] Date of Birth/Sex: 24-Jul-1950 (70 y.o. F) Treating RN: Donnamarie Poag Primary Care Provider: [name], Olivia Mackie Other Clinician: Referring Provider: [name], Olivia Mackie Treating Provider/Extender: Skipper Cliche in Treatment: 24 Debridement Performed for Wound #16 Left,Distal,Anterior Metatarsal head third Assessment: Performed By: Physician Tommie Sams., PA-C Debridement Type: Debridement Severity of Tissue Pre Debridement: Bone involvement without necrosis Level of Consciousness (Pre- Awake and Alert procedure): Pre-procedure Verification/Time Out Yes - 14:27 Taken: Start Time: 14:27 Pain Control: Lidocaine Total Area Debrided (L x W): 0.7 (cm) x 0.7 (cm) = 0.49 (cm) Tissue and other material Viable, Non-Viable, Slough, Subcutaneous, Slough debrided: Level: Skin/Subcutaneous Tissue Debridement Description: Excisional Instrument: Curette Bleeding: Minimum Hemostasis Achieved: Pressure Response to Treatment: Procedure was  tolerated well Level of Consciousness (Post- Awake and Alert procedure): Post Debridement Measurements of Total Wound Length: (cm) 0.7 Width: (cm) 0.7 Depth: (cm) 0.1 Volume: (cm) 0.038 Character of Wound/Ulcer Post Debridement: Improved Severity of Tissue Post Debridement: Bone involvement without necrosis Post Procedure Diagnosis Same as Pre-procedure Electronic Signature(s) Signed: 12/13/2020 3:42:23 PM By: Donnamarie Poag Signed: 12/13/2020 4:54:51 PM By: Worthy Keeler PA-C Entered By: Donnamarie Poag on 12/13/2020 14:29:10 [name] ([id number]) -------------------------------------------------------------------------------- HPI Details Patient Name: [name] Date of Service: 12/13/2020 1:45 PM Medical Record Number: [id number] Patient Account Number: [id number] Date of Birth/Sex: 1950-07-25 (70 y.o. F) Treating RN: Donnamarie Poag Primary Care Provider: [name], Olivia Mackie Other Clinician: Referring Provider: [name], Olivia Mackie Treating Provider/Extender: Skipper Cliche in Treatment: 24 History of Present Illness HPI Description: 06/28/20 upon evaluation today patient appears to be doing unfortunately somewhat poorly in regard to wounds that she has over her lower extremities. She has a wound on the left distal second toe, left posterior heel, and right anterior lower leg. With that being said all in all none of the wounds appear to be too bad the one that hurts her the most is actually the wound on the posterior heel which actually is also one of the smallest. Nonetheless I think there is some callus hiding what is really going on in this area. Fortunately there does not appear to be any obvious evidence of infection which is great news. Patient does have a history of diabetes mellitus type 2, chronic venous insufficiency, hypertension, history of DVTs, and is on long-term anticoagulant therapy, Eliquis. 07/05/20 upon evaluation today patient appears to be doing  well currently with regard to her wounds. I feel like she is making progress which is great news and overall there is no signs of active infection at this time. No fevers, chills, nausea, vomiting, or diarrhea. 4/29; patient has a only a small area remaining on the tip of her left second toe and a small area remaining on the left heel. The area on the right anterior lower leg is healed. They tell  me that she had remote podiatric surgery on the toes of the left foot however they are very fixed in position and I wonder whether they are making it difficult to relieve pressure in her foot wear. She is a type II diabetic with neuropathy as well. 08/01/2020 upon evaluation today patient appears to be doing well currently in regard to her wounds. In fact everything appears to be doing much better. The posterior aspect of her heel is actually giving her some trouble not the Achilles area but a small wound that we did not even have marked. In fact I had noted this before. Nonetheless we are going to addressed that today. 5/27; most of the patient's wounds are healed including the left Achilles heel left toe. Right forearm is also healed. She has a new abrasion posteriorly in the right elbow area just above the olecranon this happened today with an abrasion from her car door 6/15; the patient's wounds on the left Achilles and left second toe remain healed. The area on the right forearm is also just about healed in fact the skin I replaced last time is looking viable which is good. She had me look at the left heel again which is not in the area of the wound more towards the tip of the heel at the Achilles insertion site as well as the tip of the left foot second toe. In the Achilles area that is clearly is friction probably in bed at night. Not really near where the wound was. The left second toe remains healed although this is hammered and overhanging first toe also puts pressure in this area 09/13/2020 upon  evaluation today patient appears to be doing well with regard to all previous wounds that she had. Everything is completely healed. With that being said I do think that the patient unfortunately has a new skin tear on the right forearm which is due to her put a Band-Aid on it and then when it pulled off this caused some issues with skin tearing. Nonetheless I think that this needs to be addressed today. Hopefully will take too long to get this to heal. She is having some issues with pain in her heel but I think this is more related to be honest to her neuropathy as opposed to anything else. Readmission: 10/30/2020 upon evaluation today patient appears to be doing poorly in regard to her legs bilaterally. She is also having an issue with her fourth toe on the left. Fortunately there does not appear to be any signs of active infection at this time. No fevers, chills, nausea, vomiting, or diarrhea. It is actually been quite a while since we have seen her. In fact June 24 was the last time that she was here in the clinic. She tells me she did not come back because things were just doing "pretty well". 11/29/2020 upon evaluation today patient actually appears to be doing well with regard to her arms those are completely healed. Her right leg is still open but doing okay. The fourth toe of the left foot does show some signs here of having some issues with necrotic tissue of an open wound. This is where a toenail was removed by podiatry. Subsequently I do think that we need to go ahead and see what we can do about getting this cleared away so being try to get some healing going forward. 12/13/2020 upon evaluation today patient appears to be doing decently well in regard to her wound on the right leg.  In general I think that she is making good progress here. Unfortunately she continues to have significant issues with swelling in the left leg is now even more swollen at this point. For that reason I do believe  she would be benefited by wrapping both sides although she is not interested in doing this. Overall I think that she is definitely making some good progress here nonetheless in regard to the right leg left leg leave some to be desired. Electronic Signature(s) Signed: 12/13/2020 3:23:35 PM By: Worthy Keeler PA-C Entered By: Worthy Keeler on 12/13/2020 15:23:35 [name], [name] ([id number]) -------------------------------------------------------------------------------- Physical Exam Details Patient Name: [name] Date of Service: 12/13/2020 1:45 PM Medical Record Number: [id number] Patient Account Number: [id number] Date of Birth/Sex: 1950/06/09 (70 y.o. F) Treating RN: Donnamarie Poag Primary Care Provider: [name], Olivia Mackie Other Clinician: Referring Provider: [name], Olivia Mackie Treating Provider/Extender: Skipper Cliche in Treatment: 26 Constitutional Well-nourished and well-hydrated in no acute distress. Respiratory normal breathing without difficulty. Psychiatric this patient is able to make decisions and demonstrates good insight into disease process. Alert and Oriented x 3. pleasant and cooperative. Notes Upon inspection patient's wound bed actually showed signs of good granulation epithelization at this point. I do think that on the right leg she is doing well on the left leg she has some new areas that are open but has me much more concerned. I think she really needs to be wrapped on both legs but she does not want me to do anything on the left leg region. She tells me if is not doing better by next time with her wearing a compression stocking that she will let me wrap it then. Electronic Signature(s) Signed: 12/13/2020 3:24:11 PM By: Worthy Keeler PA-C Entered By: Worthy Keeler on 12/13/2020 15:24:11 [name], [name] ([id number]) -------------------------------------------------------------------------------- Physician Orders Details Patient Name:  [name] Date of Service: 12/13/2020 1:45 PM Medical Record Number: [id number] Patient Account Number: [id number] Date of Birth/Sex: 10-22-1950 (70 y.o. F) Treating RN: Donnamarie Poag Primary Care Provider: [name], Olivia Mackie Other Clinician: Referring Provider: [name], Olivia Mackie Treating Provider/Extender: Skipper Cliche in Treatment: 24 Verbal / Phone Orders: No Diagnosis Coding ICD-10 Coding Code Description E11.621 Type 2 diabetes mellitus with foot ulcer I87.2 Venous insufficiency (chronic) (peripheral) Z79.01 Long term (current) use of anticoagulants L97.812 Non-pressure chronic ulcer of other part of right lower leg with fat layer exposed L97.522 Non-pressure chronic ulcer of other part of left foot with fat layer exposed Follow-up Appointments o Return Appointment in 2 weeks. - at request [place] [address] [place]: - [place] for wound care. May utilize formulary equivalent dressing for wound treatment orders unless otherwise specified. Home Health Nurse may visit PRN to address patientos wound care needs. o Scheduled days for dressing changes to be completed; exception, patient has scheduled wound care visit that day. o **Please direct any NON-WOUND related issues/requests for orders to patient's Primary Care Physician. **If current dressing causes regression in wound condition, may D/C ordered dressing product/s and apply Normal Saline Moist Dressing daily until next [place] or Other MD appointment. **Notify [facility] of regression in wound condition at [phone]. Bathing/ Shower/ Hygiene o May shower with wound dressing protected with water repellent cover or cast protector. o No tub bath. Edema Control - Lymphedema / Segmental Compressive Device / Other o Patient to wear own compression stockings. Remove compression stockings every night before  going  to bed and put on every morning when getting up. - WHEN NOT USING A COMPRESSION WRAP, USE COMPRESSION HOSE o Elevate, Exercise Daily and Avoid Standing for Long Periods of Time. o Elevate legs to the level of the heart and pump ankles as often as possible o Elevate leg(s) parallel to the floor when sitting. o DO YOUR BEST to sleep in the bed at night. DO NOT sleep in your recliner. Long hours of sitting in a recliner leads to swelling of the legs and/or potential wounds on your backside. Off-Loading Wound #16 Left,Distal,Anterior Metatarsal head third o Open toe surgical shoe - keep pressure off wound/toes o Other: - suggest float heels when in bed with a pillow to keep pressure off of heels; choose shoewear for right foot to not rub foot Additional Orders / Instructions o Follow Nutritious Diet and Increase Protein Intake Wound Treatment Wound #13 - Lower Leg Wound Laterality: Right, Midline Cleanser: Soap and Water 3 x Per Week/30 Days Discharge Instructions: Gently cleanse wound with antibacterial soap, rinse and pat dry prior to dressing wounds Cleanser: Wound Cleanser 3 x Per Week/30 Days Discharge Instructions: Wash your hands with soap and water. Remove old dressing, discard into plastic bag and place into trash. Cleanse the wound with Wound Cleanser prior to applying a clean dressing using gauze sponges, not tissues or cotton balls. Do not scrub or use excessive force. Pat dry using gauze sponges, not tissue or cotton balls. Primary Dressing: Hydrofera Blue Ready Transfer Foam, 2.5x2.5 (in/in) (Home Health) 3 x Per Week/30 Days [name], [name] ([id number]) Discharge Instructions: Apply Hydrofera Blue Ready to wound bed as directed Secondary Dressing: ABD Pad 5x9 (in/in) (Home Health) 3 x Per Week/30 Days Discharge Instructions: Cover with ABD pad Compression Wrap: Profore Lite LF 3 Multilayer Compression Bandaging System (Home Health) 3 x Per Week/30  Days Discharge Instructions: Apply 3 multi-layer wrap as prescribed. Wound #16 - Metatarsal head third Wound Laterality: Left, Anterior, Distal Cleanser: Soap and Water Every Other Day/30 Days Discharge Instructions: Gently cleanse wound with antibacterial soap, rinse and pat dry prior to dressing wounds Cleanser: Wound Cleanser Every Other Day/30 Days Discharge Instructions: Wash your hands with soap and water. Remove old dressing, discard into plastic bag and place into trash. Cleanse the wound with Wound Cleanser prior to applying a clean dressing using gauze sponges, not tissues or cotton balls. Do not scrub or use excessive force. Pat dry using gauze sponges, not tissue or cotton balls. Primary Dressing: Hydrofera Blue Ready Transfer Foam, 2.5x2.5 (in/in) (Home Health) Every Other Day/30 Days Discharge Instructions: Apply Hydrofera Blue Ready to wound bed as directed Secondary Dressing: ABD Pad 5x9 (in/in) Every Other Day/30 Days Discharge Instructions: Cover with ABD pad or cut in half Secured With: 9M Medipore H Soft Cloth Surgical Tape, 2x2 (in/yd) Every Other Day/30 Days Secured With: [employer] Sterile or Non-Sterile 6-ply 4.5x4 (yd/yd) Every Other Day/30 Days Discharge Instructions: Apply Kerlix as directed Wound #17 - Lower Leg Wound Laterality: Left Cleanser: Normal Saline 3 x Per Week/30 Days Discharge Instructions: Wash your hands with soap and water. Remove old dressing, discard into plastic bag and place into trash. Cleanse the wound with Normal Saline prior to applying a clean dressing using gauze sponges, not tissues or cotton balls. Do not scrub or use excessive force. Pat dry using gauze sponges, not tissue or cotton balls. Primary Dressing: Hydrofera Blue Ready Transfer Foam, 2.5x2.5 (in/in) 3 x Per Week/30 Days Discharge Instructions: Apply Hydrofera Blue Ready to  wound bed as directed Secondary Dressing: Coverlet Latex-Free Fabric Adhesive Dressings 3 x Per Week/30  Days Discharge Instructions: 1.5 x 2 Radiology o MRI with and without Contrast - left foot-evaluate 4th left toe Electronic Signature(s) Signed: 12/13/2020 3:42:23 PM By: Donnamarie Poag Signed: 12/13/2020 4:54:51 PM By: Worthy Keeler PA-C Entered By: Donnamarie Poag on 12/13/2020 14:34:03 Blessinger, [name] ([id number]) -------------------------------------------------------------------------------- Problem List Details Patient Name: [name] Date of Service: 12/13/2020 1:45 PM Medical Record Number: [id number] Patient Account Number: [id number] Date of Birth/Sex: 1950/12/17 (70 y.o. F) Treating RN: Donnamarie Poag Primary Care Provider: [name], Olivia Mackie Other Clinician: Referring Provider: [name], Olivia Mackie Treating Provider/Extender: Skipper Cliche in Treatment: 24 Active Problems ICD-10 Encounter Code Description Active Date MDM Diagnosis E11.621 Type 2 diabetes mellitus with foot ulcer 06/28/2020 No Yes I87.2 Venous insufficiency (chronic) (peripheral) 06/28/2020 No Yes Z79.01 Long term (current) use of anticoagulants 06/28/2020 No Yes L97.812 Non-pressure chronic ulcer of other part of right lower leg with fat layer 06/28/2020 No Yes exposed L97.522 Non-pressure chronic ulcer of other part of left foot with fat layer 06/28/2020 No Yes exposed Inactive Problems ICD-10 Code Description Active Date Inactive Date I10 Essential (primary) hypertension 06/28/2020 06/28/2020 Z86.718 Personal history of other venous thrombosis and embolism 06/28/2020 06/28/2020 Resolved Problems ICD-10 Code Description Active Date Resolved Date L97.322 Non-pressure chronic ulcer of left ankle with fat layer exposed 06/28/2020 06/28/2020 S50.811A Abrasion of right forearm, initial encounter 10/30/2020 10/30/2020 S50.311D Abrasion of right elbow, subsequent encounter 08/16/2020 08/16/2020 Electronic Signature(s) WILLISTINE, FERRALL ([id number]) Signed: 12/13/2020 2:07:40 PM By: Worthy Keeler PA-C Entered By:  Worthy Keeler on 12/13/2020 14:07:39 [name], [name] ([id number]) -------------------------------------------------------------------------------- Progress Note Details Patient Name: [name] Date of Service: 12/13/2020 1:45 PM Medical Record Number: [id number] Patient Account Number: [id number] Date of Birth/Sex: [birth year] (70 y.o. F) Treating RN: Donnamarie Poag Primary Care Provider: [name], Olivia Mackie Other Clinician: Referring Provider: [name], Olivia Mackie Treating Provider/Extender: Skipper Cliche in Treatment: 24 Subjective Chief Complaint Information obtained from Patient Right Leg Ulcer and Right 3rd Toe Ulcer History of Present Illness (HPI) 06/28/20 upon evaluation today patient appears to be doing unfortunately somewhat poorly in regard to wounds that she has over her lower extremities. She has a wound on the left distal second toe, left posterior heel, and right anterior lower leg. With that being said all in all none of the wounds appear to be too bad the one that hurts her the most is actually the wound on the posterior heel which actually is also one of the smallest. Nonetheless I think there is some callus hiding what is really going on in this area. Fortunately there does not appear to be any obvious evidence of infection which is great news. Patient does have a history of diabetes mellitus type 2, chronic venous insufficiency, hypertension, history of DVTs, and is on long-term anticoagulant therapy, Eliquis. 07/05/20 upon evaluation today patient appears to be doing well currently with regard to her wounds. I feel like she is making progress which is great news and overall there is no signs of active infection at this time. No fevers, chills, nausea, vomiting, or diarrhea. 4/29; patient has a only a small area remaining on the tip of her left second toe and a small area remaining on the left heel. The area on the right anterior lower leg is healed. They tell  me that she had remote podiatric surgery on the toes of the left foot however they are very fixed in position  and I wonder whether they are making it difficult to relieve pressure in her foot wear. She is a type II diabetic with neuropathy as well. 08/01/2020 upon evaluation today patient appears to be doing well currently in regard to her wounds. In fact everything appears to be doing much better. The posterior aspect of her heel is actually giving her some trouble not the Achilles area but a small wound that we did not even have marked. In fact I had noted this before. Nonetheless we are going to addressed that today. 5/27; most of the patient's wounds are healed including the left Achilles heel left toe. Right forearm is also healed. She has a new abrasion posteriorly in the right elbow area just above the olecranon this happened today with an abrasion from her car door 6/15; the patient's wounds on the left Achilles and left second toe remain healed. The area on the right forearm is also just about healed in fact the skin I replaced last time is looking viable which is good. She had me look at the left heel again which is not in the area of the wound more towards the tip of the heel at the Achilles insertion site as well as the tip of the left foot second toe. In the Achilles area that is clearly is friction probably in bed at night. Not really near where the wound was. The left second toe remains healed although this is hammered and overhanging first toe also puts pressure in this area 09/13/2020 upon evaluation today patient appears to be doing well with regard to all previous wounds that she had. Everything is completely healed. With that being said I do think that the patient unfortunately has a new skin tear on the right forearm which is due to her put a Band-Aid on it and then when it pulled off this caused some issues with skin tearing. Nonetheless I think that this needs to be addressed  today. Hopefully will take too long to get this to heal. She is having some issues with pain in her heel but I think this is more related to be honest to her neuropathy as opposed to anything else. Readmission: 10/30/2020 upon evaluation today patient appears to be doing poorly in regard to her legs bilaterally. She is also having an issue with her fourth toe on the left. Fortunately there does not appear to be any signs of active infection at this time. No fevers, chills, nausea, vomiting, or diarrhea. It is actually been quite a while since we have seen her. In fact June 24 was the last time that she was here in the clinic. She tells me she did not come back because things were just doing "pretty well". 11/29/2020 upon evaluation today patient actually appears to be doing well with regard to her arms those are completely healed. Her right leg is still open but doing okay. The fourth toe of the left foot does show some signs here of having some issues with necrotic tissue of an open wound. This is where a toenail was removed by podiatry. Subsequently I do think that we need to go ahead and see what we can do about getting this cleared away so being try to get some healing going forward. 12/13/2020 upon evaluation today patient appears to be doing decently well in regard to her wound on the right leg. In general I think that she is making good progress here. Unfortunately she continues to have significant issues with swelling in  the left leg is now even more swollen at this point. For that reason I do believe she would be benefited by wrapping both sides although she is not interested in doing this. Overall I think that she is definitely making some good progress here nonetheless in regard to the right leg left leg leave some to be desired. Objective Constitutional [name], Dawn B. ([id number]) Well-nourished and well-hydrated in no acute distress. Vitals Time Taken: 3:58 PM, Height: 65.5 in, Weight:  240 lbs, BMI: 39.3, Temperature: 98.3 F, Pulse: 63 bpm, Respiratory Rate: 18 breaths/min, Blood Pressure: 159/76 mmHg. Respiratory normal breathing without difficulty. Psychiatric this patient is able to make decisions and demonstrates good insight into disease process. Alert and Oriented x 3. pleasant and cooperative. General Notes: Upon inspection patient's wound bed actually showed signs of good granulation epithelization at this point. I do think that on the right leg she is doing well on the left leg she has some new areas that are open but has me much more concerned. I think she really needs to be wrapped on both legs but she does not want me to do anything on the left leg region. She tells me if is not doing better by next time with her wearing a compression stocking that she will let me wrap it then. Integumentary (Hair, Skin) Wound #13 status is Open. Original cause of wound was Gradually Appeared. The date acquired was: 10/21/2020. The wound has been in treatment 6 weeks. The wound is located on the Right,Midline Lower Leg. The wound measures 1cm length x 0.5cm width x 0.1cm depth; 0.393cm^2 area and 0.039cm^3 volume. There is Fat Layer (Subcutaneous Tissue) exposed. There is no tunneling or undermining noted. There is a medium amount of serosanguineous drainage noted. The wound margin is flat and intact. There is large (67-100%) red, pink granulation within the wound bed. There is a small (1-33%) amount of necrotic tissue within the wound bed including Adherent Slough. Wound #16 status is Open. Original cause of wound was Pressure Injury. The date acquired was: 10/31/2020. The wound has been in treatment 2 weeks. The wound is located on the Left,Distal,Anterior Metatarsal head third. The wound measures 0.7cm length x 0.7cm width x 0.1cm depth; 0.385cm^2 area and 0.038cm^3 volume. There is bone and Fat Layer (Subcutaneous Tissue) exposed. There is no tunneling or undermining noted. There  is a medium amount of serosanguineous drainage noted. There is small (1-33%) red, pink granulation within the wound bed. There is a large (67-100%) amount of necrotic tissue within the wound bed including Adherent Slough. Wound #17 status is Open. Original cause of wound was Blister. The date acquired was: 12/13/2020. The wound is located on the Left Lower Leg. The wound measures 0.5cm length x 0.7cm width x 0.1cm depth; 0.275cm^2 area and 0.027cm^3 volume. There is Fat Layer (Subcutaneous Tissue) exposed. There is a medium amount of serosanguineous drainage noted. There is large (67-100%) red, pink granulation within the wound bed. There is a small (1-33%) amount of necrotic tissue within the wound bed including Adherent Slough. 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 There was a Excisional Skin/Subcutaneous Tissue Debridement with a total area of 0.49 sq cm performed by Tommie Sams., PA-C. With the following instrument(s): Curette to remove Viable and Non-Viable tissue/material. Material removed includes Subcutaneous Tissue and Slough and after achieving pain control using Lidocaine. A time out was conducted at 14:27, prior to the start of the procedure. A Minimum amount of bleeding was controlled with Pressure. The procedure was tolerated well. Post Debridement Measurements: 0.7cm length x 0.7cm width x 0.1cm depth; 0.038cm^3 volume. Character of Wound/Ulcer Post Debridement is  improved. Severity of Tissue Post Debridement is: Bone involvement without necrosis. Post procedure Diagnosis Wound #16: Same as Pre-Procedure Wound #13 Pre-procedure diagnosis of Wound #13 is a Venous Leg Ulcer located on the Right,Midline Lower Leg . There was a Three Layer Compression Therapy Procedure by Donnamarie Poag, RN. Post procedure Diagnosis Wound #13: Same as Pre-Procedure [name], Dawn B. ([id number]) Plan Follow-up Appointments: Return Appointment in 2 weeks. - at request Home Health: Wound #13 Right,Midline Lower Leg: [facility]: [facility] for wound care. May utilize formulary equivalent dressing for wound treatment orders unless otherwise specified. Home Health Nurse may visit [facility] to address patient s wound care needs. Scheduled days for dressing changes to be completed; exception, patient has scheduled wound care visit that day. **Please direct any NON-WOUND related issues/requests for orders to patient's Primary Care Physician. **If current dressing causes regression in wound condition, may D/C ordered dressing product/s and apply Normal Saline Moist Dressing daily until next [facility] or Other MD appointment. **Notify [facility] of regression in wound condition at [phone]. Bathing/ Shower/ Hygiene: May shower with wound dressing protected with water repellent cover or cast protector. No tub bath. Edema Control - Lymphedema / Segmental Compressive Device / Other: Patient to wear own compression stockings. Remove compression stockings every night before going to bed and put on every morning when getting up. - WHEN NOT USING A COMPRESSION WRAP, USE COMPRESSION HOSE Elevate, Exercise Daily and Avoid Standing for Long Periods of Time. Elevate legs to the level of the heart and pump ankles as often as possible Elevate leg(s) parallel to the floor when sitting. DO YOUR BEST to sleep in the bed at night. DO NOT sleep in  your recliner. Long hours of sitting in a recliner leads to swelling of the legs and/or potential wounds on your backside. Off-Loading: Wound #16 Left,Distal,Anterior Metatarsal head third: Open toe surgical shoe - keep pressure off wound/toes Other: - suggest float heels when in bed with a pillow to keep pressure off of heels; choose shoewear for right foot to not rub foot Additional Orders / Instructions: Follow Nutritious Diet and Increase Protein Intake Radiology ordered were: MRI with and without Contrast - left foot-evaluate 4th left toe WOUND #13: - Lower Leg Wound Laterality: Right, Midline Cleanser: Soap and Water 3 x Per Week/30 Days Discharge Instructions: Gently cleanse wound with antibacterial soap, rinse and pat dry prior to dressing wounds Cleanser: Wound Cleanser 3 x Per Week/30 Days Discharge Instructions: Wash your hands with soap and water. Remove old dressing, discard into plastic bag and place into trash. Cleanse the wound with Wound Cleanser prior to applying a clean dressing using gauze sponges, not tissues or cotton balls. Do not scrub or use excessive force. Pat dry using gauze sponges, not tissue or cotton balls. Primary Dressing: Hydrofera Blue Ready Transfer Foam, 2.5x2.5 (in/in) (Home Health) 3 x Per Week/30 Days Discharge  Instructions: Apply Hydrofera Blue Ready to wound bed as directed Secondary Dressing: ABD Pad 5x9 (in/in) (Home Health) 3 x Per Week/30 Days Discharge Instructions: Cover with ABD pad Compression Wrap: Profore Lite LF 3 Multilayer Compression Bandaging System (Home Health) 3 x Per Week/30 Days Discharge Instructions: Apply 3 multi-layer wrap as prescribed. WOUND #16: - Metatarsal head third Wound Laterality: Left, Anterior, Distal Cleanser: Soap and Water Every Other Day/30 Days Discharge Instructions: Gently cleanse wound with antibacterial soap, rinse and pat dry prior to dressing wounds Cleanser: Wound Cleanser Every Other Day/30  Days Discharge Instructions: Wash your hands with soap and water. Remove old dressing, discard into plastic bag and place into trash. Cleanse the wound with Wound Cleanser prior to applying a clean dressing using gauze sponges, not tissues or cotton balls. Do not scrub or use excessive force. Pat dry using gauze sponges, not tissue or cotton balls. Primary Dressing: Hydrofera Blue Ready Transfer Foam, 2.5x2.5 (in/in) (Home Health) Every Other Day/30 Days Discharge Instructions: Apply Hydrofera Blue Ready to wound bed as directed Secondary Dressing: ABD Pad 5x9 (in/in) Every Other Day/30 Days Discharge Instructions: Cover with ABD pad or cut in half Secured With: 45M Medipore H Soft Cloth Surgical Tape, 2x2 (in/yd) Every Other Day/30 Days Secured With: [employer] Sterile or Non-Sterile 6-ply 4.5x4 (yd/yd) Every Other Day/30 Days Discharge Instructions: Apply Kerlix as directed WOUND #17: - Lower Leg Wound Laterality: Left Cleanser: Normal Saline 3 x Per Week/30 Days Discharge Instructions: Wash your hands with soap and water. Remove old dressing, discard into plastic bag and place into trash. Cleanse the wound with Normal Saline prior to applying a clean dressing using gauze sponges, not tissues or cotton balls. Do not scrub or use excessive force. Pat dry using gauze sponges, not tissue or cotton balls. Primary Dressing: Hydrofera Blue Ready Transfer Foam, 2.5x2.5 (in/in) 3 x Per Week/30 Days Discharge Instructions: Apply Hydrofera Blue Ready to wound bed as directed Secondary Dressing: Coverlet Latex-Free Fabric Adhesive Dressings 3 x Per Week/30 Days Discharge Instructions: 1.5 x 2 1. Would recommend currently that we continue with the compression wrap on the right leg and the patient is in agreement with that plan. This includes the use of the [facility] which I think is doing a great job. 2. With regard to the left leg she has small areas that are starting to open I think that she  would benefit from compression wrapping here but she wants to hold off for the time being. 3. With regard to her left fourth toe the x-ray was negative but nonetheless I still have concerns here about osteomyelitis especially with bone [place], [place]. ([id number]) exposed I Minna recommend that we go ahead and order an MRI and the patient is in agreement with that plan. We will see what this shows and make any adjustments in therapy as needed following. The good news is this not quite as erythematous as far as the toe is concerned as compared to where it was previous. We will see patient back for reevaluation in 1 week here in the clinic. If anything worsens or changes patient will contact our office for additional recommendations. Electronic Signature(s) Signed: 12/13/2020 3:25:28 PM By: Worthy Keeler PA-C Entered By: Worthy Keeler on 12/13/2020 15:25:28 [name], [name] ([id number]) -------------------------------------------------------------------------------- SuperBill Details Patient Name: [name] Date of Service: 12/13/2020 Medical Record Number: [id number] Patient Account Number: [id number] Date of Birth/Sex: Nov 09, 1950 (70 y.o. F) Treating RN: Donnamarie Poag Primary Care Provider: [name], Olivia Mackie  Other Clinician: Referring Provider: [name], Olivia Mackie Treating Provider/Extender: Skipper Cliche in Treatment: 24 Diagnosis Coding ICD-10 Codes Code Description E11.621 Type 2 diabetes mellitus with foot ulcer I87.2 Venous insufficiency (chronic) (peripheral) Z79.01 Long term (current) use of anticoagulants L97.812 Non-pressure chronic ulcer of other part of right lower leg with fat layer exposed L97.522 Non-pressure chronic ulcer of other part of left foot with fat layer exposed Facility Procedures The patient participates with Medicare or their insurance follows the Medicare Facility Guidelines: CPT4 Code Description Modifier Quantity [id number] 11042 - DEB  SUBQ TISSUE 20 SQ CM/< 1 The patient participates with Medicare or their insurance follows the Medicare Facility Guidelines: ICD-10 Diagnosis Description L97.522 Non-pressure chronic ulcer of other part of left foot with fat layer exposed Physician Procedures CPT4 Code: [id number] Description: 99214 - WC PHYS LEVEL 4 - EST PT Modifier: 25 Quantity: 1 CPT4 Code: Description: ICD-10 Diagnosis Description E11.621 Type 2 diabetes mellitus with foot ulcer I87.2 Venous insufficiency (chronic) (peripheral) Z79.01 Long term (current) use of anticoagulants L97.812 Non-pressure chronic ulcer of other part of right lower  leg with fat laye Modifier: r exposed Quantity: CPT4 Code: [id number] Description: 22411 - WC PHYS SUBQ TISS 20 SQ CM Modifier: Quantity: 1 CPT4 Code: Description: ICD-10 Diagnosis Description L97.522 Non-pressure chronic ulcer of other part of left foot with fat layer expo Modifier: sed Quantity: Electronic Signature(s) Signed: 12/13/2020 3:28:33 PM By: Worthy Keeler PA-C Entered By: Worthy Keeler on 12/13/2020 15:28:32

## 2020-12-16 ENCOUNTER — Telehealth: Payer: Self-pay | Admitting: Internal Medicine

## 2020-12-16 DIAGNOSIS — E1142 Type 2 diabetes mellitus with diabetic polyneuropathy: Secondary | ICD-10-CM | POA: Diagnosis not present

## 2020-12-16 DIAGNOSIS — J449 Chronic obstructive pulmonary disease, unspecified: Secondary | ICD-10-CM | POA: Diagnosis not present

## 2020-12-16 DIAGNOSIS — I872 Venous insufficiency (chronic) (peripheral): Secondary | ICD-10-CM | POA: Diagnosis not present

## 2020-12-16 DIAGNOSIS — E1159 Type 2 diabetes mellitus with other circulatory complications: Secondary | ICD-10-CM | POA: Diagnosis not present

## 2020-12-16 DIAGNOSIS — G8194 Hemiplegia, unspecified affecting left nondominant side: Secondary | ICD-10-CM | POA: Diagnosis not present

## 2020-12-16 DIAGNOSIS — E1151 Type 2 diabetes mellitus with diabetic peripheral angiopathy without gangrene: Secondary | ICD-10-CM | POA: Diagnosis not present

## 2020-12-16 DIAGNOSIS — I69319 Unspecified symptoms and signs involving cognitive functions following cerebral infarction: Secondary | ICD-10-CM | POA: Diagnosis not present

## 2020-12-16 DIAGNOSIS — I69320 Aphasia following cerebral infarction: Secondary | ICD-10-CM | POA: Diagnosis not present

## 2020-12-16 DIAGNOSIS — I4819 Other persistent atrial fibrillation: Secondary | ICD-10-CM | POA: Diagnosis not present

## 2020-12-16 DIAGNOSIS — I152 Hypertension secondary to endocrine disorders: Secondary | ICD-10-CM | POA: Diagnosis not present

## 2020-12-16 NOTE — Telephone Encounter (Signed)
Liji from [facility] is calling to let Dr.Tracy know she will be sending physical therapy orders over for the patient.Liji given the office fax number.

## 2020-12-17 ENCOUNTER — Other Ambulatory Visit: Payer: Self-pay | Admitting: Physician Assistant

## 2020-12-17 DIAGNOSIS — M869 Osteomyelitis, unspecified: Secondary | ICD-10-CM

## 2020-12-17 IMAGING — US VENOUS DOPPLER ULTRASOUND OF LEFT LOWER EXTREMITY
1 series · 13 of 24 positions shown · non-contrast
Comparison: 10/01/2016

CLINICAL DATA: 68-year-old with left leg swelling and pain. History
of DVT.



[Series 1: venous doppler ultrasound of left lower extremity · 0.08mm/px · 13 of 34 slices shown]
[im 1/34]
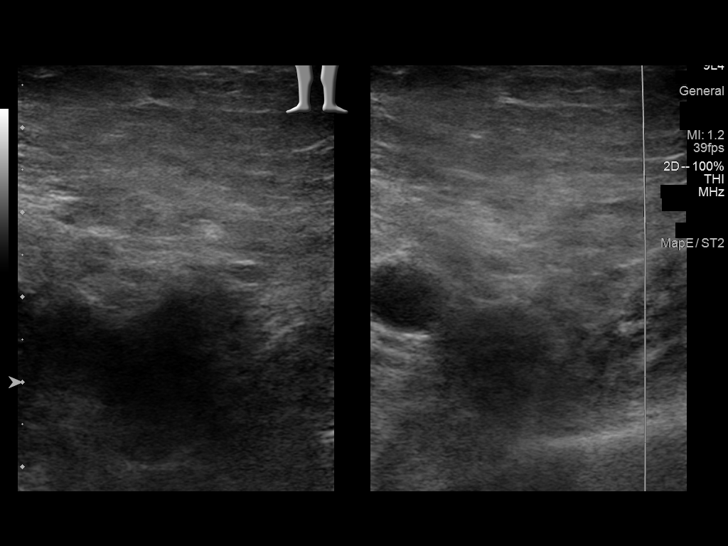
[im 3/34]
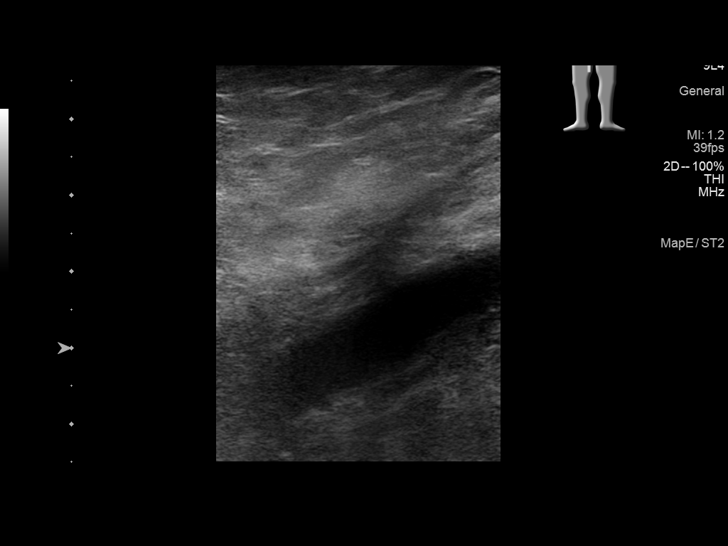
[im 6/34]
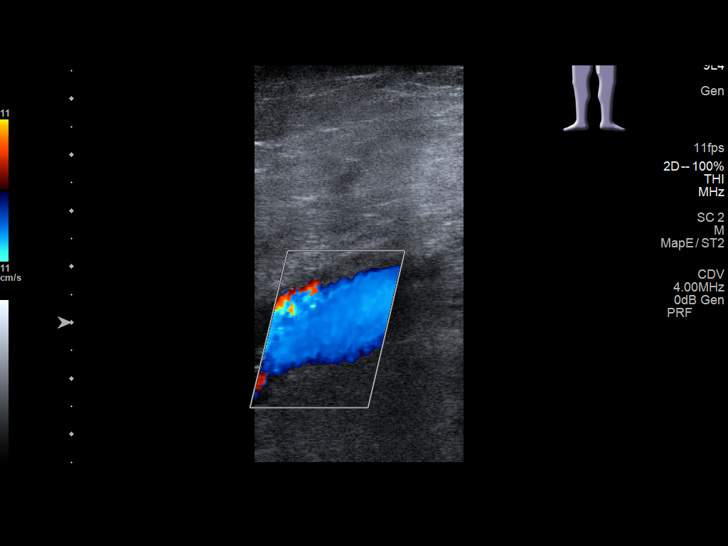
[im 9/34]
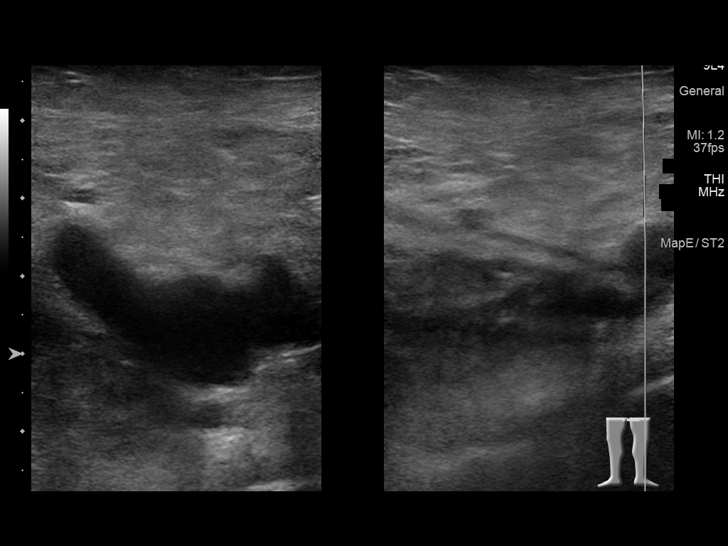
[im 12/34]
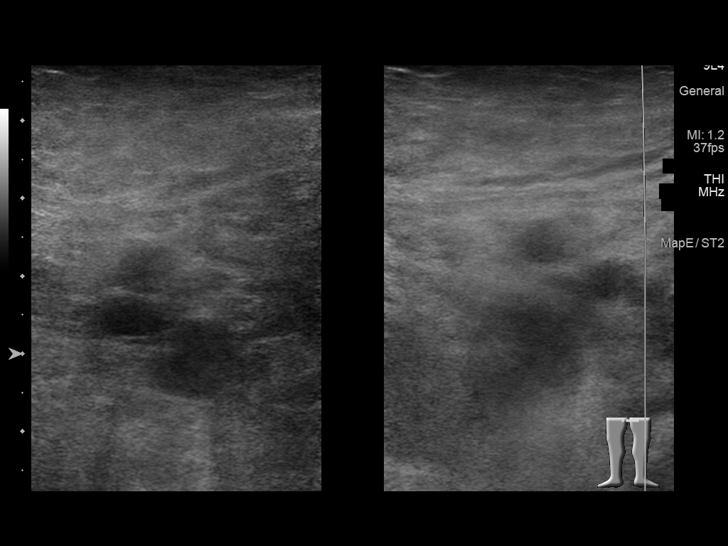
[im 15/34]
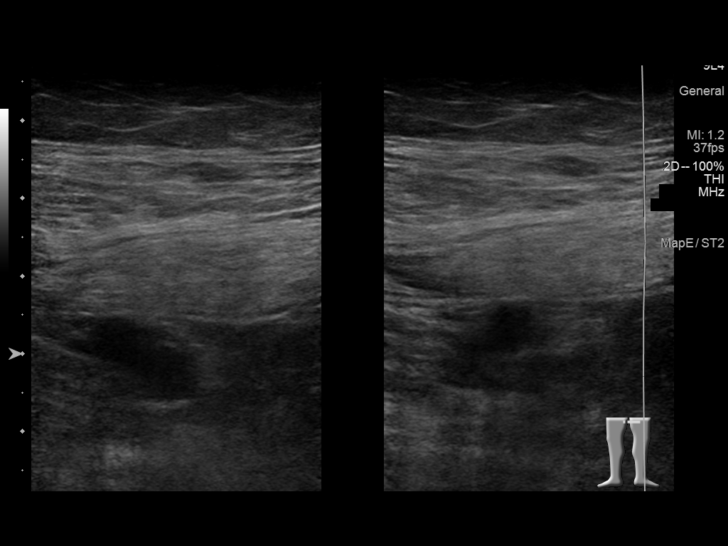
[im 18/34]
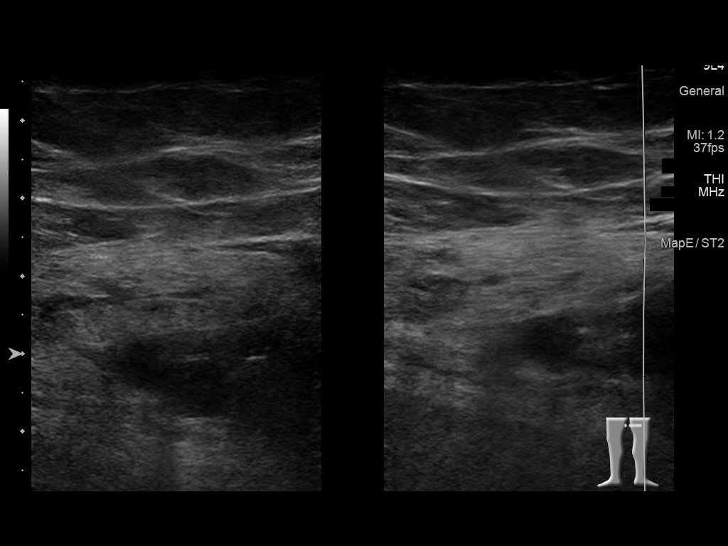
[im 19/34]
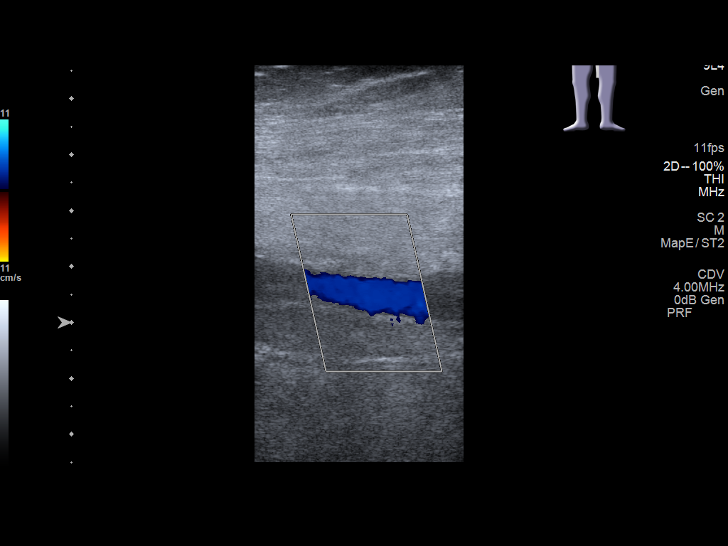
[im 22/34]
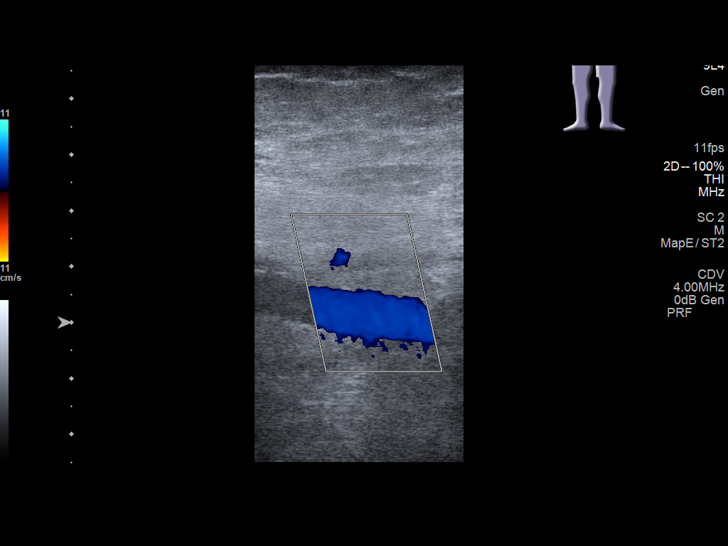
[im 25/34]
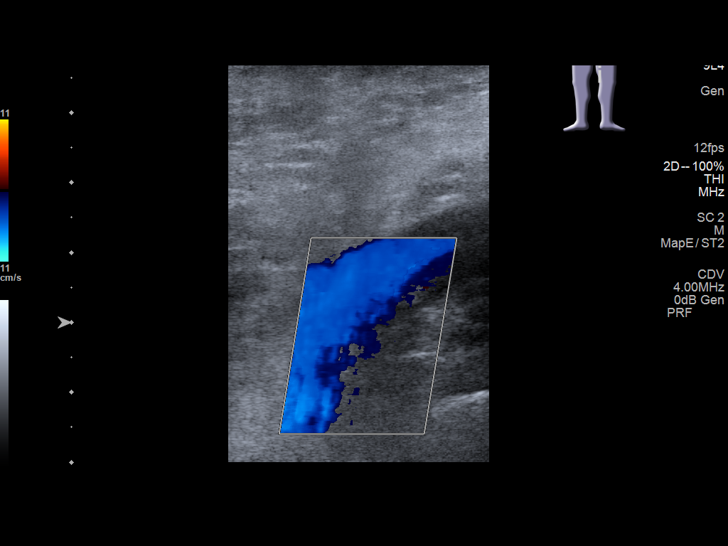
[im 28/34]
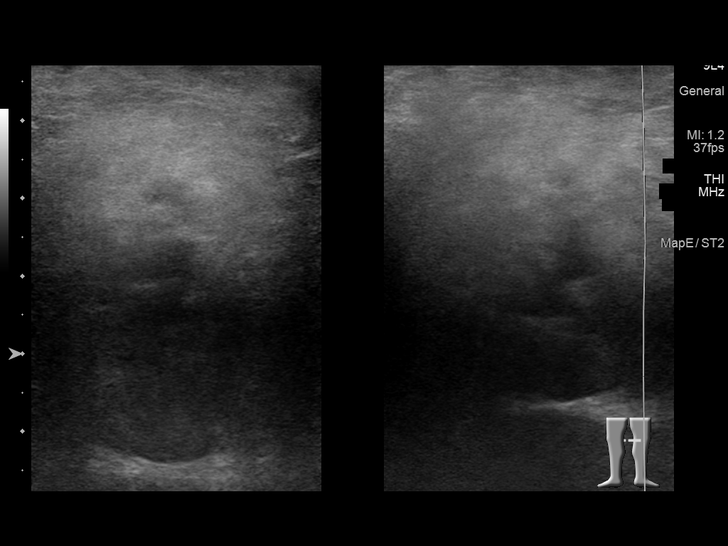
[im 31/34]
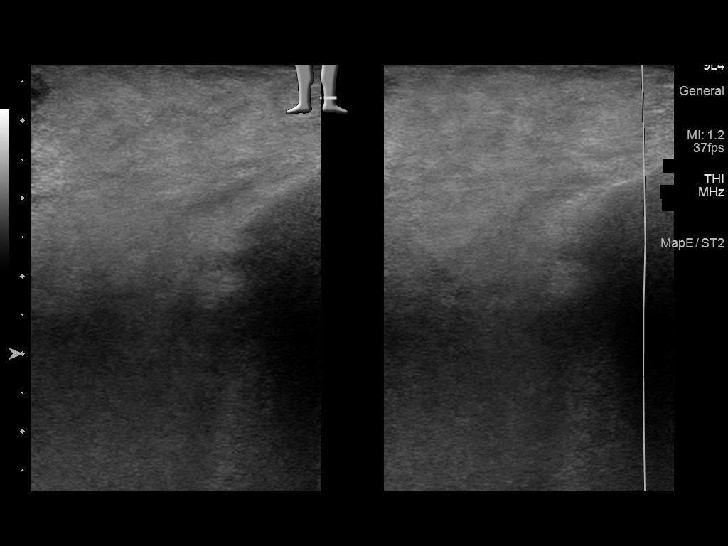
[im 34/34]
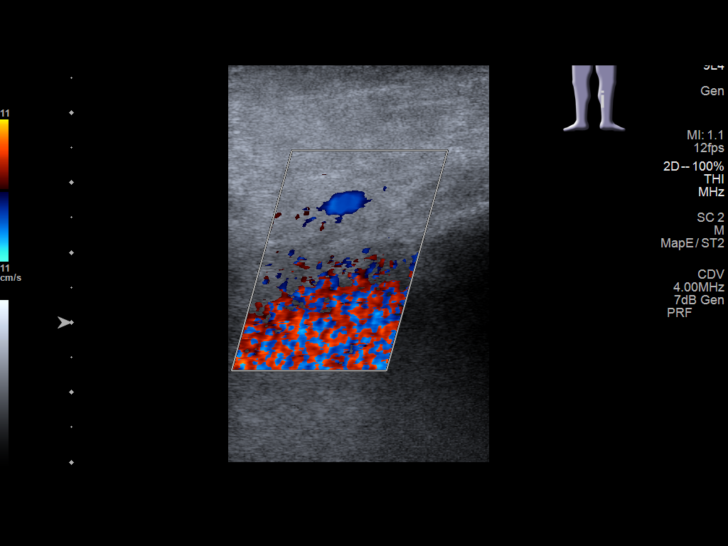

[13 of 24 positions shown; findings below may reference images not displayed]

FINDINGS: Contralateral Common Femoral Vein: Respiratory phasicity is normal
and symmetric with the symptomatic side. No evidence of thrombus.
Normal compressibility.

Common Femoral Vein: No evidence of thrombus. Normal
compressibility, respiratory phasicity and response to augmentation.

Saphenofemoral Junction: No evidence of thrombus. Normal
compressibility and flow on color Doppler imaging.

Profunda Femoral Vein: No evidence of thrombus. Normal
compressibility and flow on color Doppler imaging.

Femoral Vein: No evidence of thrombus. Normal compressibility,
respiratory phasicity and response to augmentation.

Popliteal Vein: No evidence of thrombus. Normal compressibility,
respiratory phasicity and response to augmentation.

Calf Veins: Limited evaluation of the deep calf veins due to body
habitus and edema.

Other Findings:  None.
IMPRESSION: Negative for deep venous thrombosis in left lower extremity.

Limited evaluation of the left deep calf veins.

## 2020-12-17 NOTE — Telephone Encounter (Signed)
Noted will await faxed orders

## 2020-12-18 DIAGNOSIS — I872 Venous insufficiency (chronic) (peripheral): Secondary | ICD-10-CM | POA: Diagnosis not present

## 2020-12-18 DIAGNOSIS — I4819 Other persistent atrial fibrillation: Secondary | ICD-10-CM | POA: Diagnosis not present

## 2020-12-18 DIAGNOSIS — J449 Chronic obstructive pulmonary disease, unspecified: Secondary | ICD-10-CM | POA: Diagnosis not present

## 2020-12-18 DIAGNOSIS — G8194 Hemiplegia, unspecified affecting left nondominant side: Secondary | ICD-10-CM | POA: Diagnosis not present

## 2020-12-18 DIAGNOSIS — I69320 Aphasia following cerebral infarction: Secondary | ICD-10-CM | POA: Diagnosis not present

## 2020-12-18 DIAGNOSIS — I69319 Unspecified symptoms and signs involving cognitive functions following cerebral infarction: Secondary | ICD-10-CM | POA: Diagnosis not present

## 2020-12-18 DIAGNOSIS — E1151 Type 2 diabetes mellitus with diabetic peripheral angiopathy without gangrene: Secondary | ICD-10-CM | POA: Diagnosis not present

## 2020-12-18 DIAGNOSIS — I152 Hypertension secondary to endocrine disorders: Secondary | ICD-10-CM | POA: Diagnosis not present

## 2020-12-18 DIAGNOSIS — E1159 Type 2 diabetes mellitus with other circulatory complications: Secondary | ICD-10-CM | POA: Diagnosis not present

## 2020-12-18 DIAGNOSIS — E1142 Type 2 diabetes mellitus with diabetic polyneuropathy: Secondary | ICD-10-CM | POA: Diagnosis not present

## 2020-12-20 ENCOUNTER — Encounter: Payer: Self-pay | Admitting: Nurse Practitioner

## 2020-12-20 ENCOUNTER — Ambulatory Visit: Payer: Medicare HMO | Admitting: Nurse Practitioner

## 2020-12-20 ENCOUNTER — Other Ambulatory Visit: Payer: Self-pay | Admitting: Urology

## 2020-12-20 DIAGNOSIS — I69319 Unspecified symptoms and signs involving cognitive functions following cerebral infarction: Secondary | ICD-10-CM | POA: Diagnosis not present

## 2020-12-20 DIAGNOSIS — I152 Hypertension secondary to endocrine disorders: Secondary | ICD-10-CM | POA: Diagnosis not present

## 2020-12-20 DIAGNOSIS — E1142 Type 2 diabetes mellitus with diabetic polyneuropathy: Secondary | ICD-10-CM | POA: Diagnosis not present

## 2020-12-20 DIAGNOSIS — I4819 Other persistent atrial fibrillation: Secondary | ICD-10-CM | POA: Diagnosis not present

## 2020-12-20 DIAGNOSIS — J449 Chronic obstructive pulmonary disease, unspecified: Secondary | ICD-10-CM | POA: Diagnosis not present

## 2020-12-20 DIAGNOSIS — E1151 Type 2 diabetes mellitus with diabetic peripheral angiopathy without gangrene: Secondary | ICD-10-CM | POA: Diagnosis not present

## 2020-12-20 DIAGNOSIS — G8194 Hemiplegia, unspecified affecting left nondominant side: Secondary | ICD-10-CM | POA: Diagnosis not present

## 2020-12-20 DIAGNOSIS — I872 Venous insufficiency (chronic) (peripheral): Secondary | ICD-10-CM | POA: Diagnosis not present

## 2020-12-20 DIAGNOSIS — I69320 Aphasia following cerebral infarction: Secondary | ICD-10-CM | POA: Diagnosis not present

## 2020-12-20 DIAGNOSIS — E1159 Type 2 diabetes mellitus with other circulatory complications: Secondary | ICD-10-CM | POA: Diagnosis not present

## 2020-12-20 NOTE — Progress Notes (Deleted)
Office Visit    Patient Name: [name] Date of Encounter: 12/20/2020  Primary Care Provider:  [name], [name], MD Primary Cardiologist:  Kathlyn Sacramento, MD  Chief Complaint    ***  Past Medical History    Past Medical History:  Diagnosis Date   Abnormal antibody titer    Anxiety and depression    Arthritis    Asthma    Atypical chest pain    a. 03/2018 MV: No ischemia/infarct. EF >65%.   Carotid arterial disease ([place])    a. 10/7562 U/S: RICA <33, LICA nl.   Cystocele    Depression    Diabetes ([place])    DVT (deep venous thrombosis) ([place])    Righ calf   Dyspnea    11/08/2018 - "more now due to lack of exercise"   Edema    GERD (gastroesophageal reflux disease)    Heart murmur    a. 08/2013 Echo: EF 60-65%, Mild MR (poss veg seen on TTE, not seen on TEE), nl LV size with mod conc LVH. Mild LAE; b. 08/2019 Echo: EF 60-65%, no rwma. Nl RV size/fxn. No significant valvular dzs.   History of IBS    History of kidney stones    History of methicillin resistant staphylococcus aureus (MRSA) 2008   History of multiple strokes    Hyperlipidemia    Hypertension    Hypothyroidism    Insomnia    Kidney stone    kidney stones with lithotripsy .  [facility]- "adrenal glands"   Neuropathy involving both lower extremities    Non-healing ulcer of left foot (HCC)    Obesity, Class II, BMI 35-39.9, with comorbidity    PAF (paroxysmal atrial fibrillation) ([employer])    a. 07/2013 Event monitor: Freq PVCs and 3 short runs of Afib-->CHA2DS2VASc = 7-->Eliquis.   Peripheral vascular disease ([place])    a. 03/2018 LE Angio: Nl renal arteries. Nl aorta & iliacs. Nl LLE arterial flow; b. 06/2020 ABI's:   Sleep apnea    use C-PAP   Urinary incontinence    Venous stasis dermatitis of both lower extremities    Past Surgical History:  Procedure Laterality Date   ANKLE SURGERY Right    APPLICATION OF WOUND VAC Left 06/27/2015   Procedure: APPLICATION OF WOUND VAC ( POSSIBLE ) ;   Surgeon: [name], MD;  Location: [facility] ORS;  Service: Vascular;  Laterality: Left;   APPLICATION OF WOUND VAC Left 09/25/2016   Procedure: APPLICATION OF WOUND VAC;  Surgeon: [name], DPM;  Location: [facility] ORS;  Service: Podiatry;  Laterality: Left;   arm surgery     right     CHOLECYSTECTOMY     COLONOSCOPY     COLONOSCOPY WITH PROPOFOL N/A 01/11/2017   Procedure: COLONOSCOPY WITH PROPOFOL;  Surgeon: [name], MD;  Location: [facility] ENDOSCOPY;  Service: Endoscopy;  Laterality: N/A;   COLONOSCOPY WITH PROPOFOL N/A 02/22/2017   Procedure: COLONOSCOPY WITH PROPOFOL;  Surgeon: [name], MD;  Location: [facility] ENDOSCOPY;  Service: Endoscopy;  Laterality: N/A;   COLONOSCOPY WITH PROPOFOL N/A 05/31/2017   Procedure: COLONOSCOPY WITH PROPOFOL;  Surgeon: [name], MD;  Location: [facility] ENDOSCOPY;  Service: Endoscopy;  Laterality: N/A;   ESOPHAGOGASTRODUODENOSCOPY (EGD) WITH PROPOFOL N/A 01/11/2017   Procedure: ESOPHAGOGASTRODUODENOSCOPY (EGD) WITH PROPOFOL;  Surgeon: [name], MD;  Location: [facility] ENDOSCOPY;  Service: Endoscopy;  Laterality: N/A;   ESOPHAGOGASTRODUODENOSCOPY (EGD) WITH PROPOFOL N/A 10/25/2018   Procedure: ESOPHAGOGASTRODUODENOSCOPY (EGD) WITH PROPOFOL;  Surgeon: [name], MD;  Location: [facility] ENDOSCOPY;  Service: Endoscopy;  Laterality: N/A;   ESOPHAGOGASTRODUODENOSCOPY (EGD) WITH PROPOFOL N/A 11/29/2019   Procedure: ESOPHAGOGASTRODUODENOSCOPY (EGD) WITH PROPOFOL;  Surgeon: Toledo, Benay Pike, MD;  Location: [facility] ENDOSCOPY;  Service: Gastroenterology;  Laterality: N/A;   HERNIA REPAIR     umbilical   HIATAL HERNIA REPAIR     I & D EXTREMITY Left 06/27/2015   Procedure: IRRIGATION AND DEBRIDEMENT EXTREMITY            ( CALF HEMATOMA ) POSSIBLE WOUND VAC;  Surgeon: [name], MD;  Location: [facility] ORS;  Service: Vascular;  Laterality: Left;   INCISION AND DRAINAGE OF WOUND Left 09/25/2016   Procedure: IRRIGATION AND DEBRIDEMENT - PARTIAL RESECTION  OF ACHILLES TENDON WITH WOUND VAC APPLICATION;  Surgeon: [name], DPM;  Location: [facility] ORS;  Service: Podiatry;  Laterality: Left;   INCISION AND DRAINAGE OF WOUND Left 11/09/2018   Procedure: Excision of left foot wound with ACell placement;  Surgeon: Wallace Going, DO;  Location: [facility];  Service: Plastics;  Laterality: Left;   IRRIGATION AND DEBRIDEMENT ABSCESS Left 09/07/2016   Procedure: IRRIGATION AND DEBRIDEMENT ABSCESS LEFT HEEL;  Surgeon: [name], DPM;  Location: [facility] ORS;  Service: Podiatry;  Laterality: Left;   JOINT REPLACEMENT  2013   left knee replacement   KIDNEY SURGERY Right    kidney stones   LITHOTRIPSY     LOWER EXTREMITY ANGIOGRAPHY Left 04/04/2018   Procedure: LOWER EXTREMITY ANGIOGRAPHY;  Surgeon: [name], MD;  Location: [facility] CV LAB;  Service: Cardiovascular;  Laterality: Left;   NM GATED MYOVIEW [facility] HX)  February 2017   Likely breast attenuation. LOW RISK study. Normal EF 55-60%.   OTHER SURGICAL HISTORY     08/2016 or 09/2016 surgery on achilles tenso h/o staph infection heal. Dr. [name]   removal of left hematoma Left    leg   REVERSE SHOULDER ARTHROPLASTY Right 10/08/2015   Procedure: REVERSE SHOULDER ARTHROPLASTY;  Surgeon: [name], MD;  Location: [facility] ORS;  Service: Orthopedics;  Laterality: Right;   SLEEVE GASTROPLASTY     TONSILLECTOMY     TOTAL KNEE ARTHROPLASTY Left    TOTAL SHOULDER REPLACEMENT Right    TRANSESOPHAGEAL ECHOCARDIOGRAM  08/08/2013   Mild LVH, EF 60-65%. Moderate LA dilation and mild RA dilation. Mild MR with no evidence of stenosis and no evidence of endocarditis. A false chordae is noted.   TRANSTHORACIC ECHOCARDIOGRAM  08/03/2013   Mild-Moderate concentric LVH, EF 60-65%. Normal diastolic function. Mild LA dilation. Mild MR with possible vegetation  - not confirmed on TEE    ULNAR NERVE TRANSPOSITION Right 08/06/2016   Procedure: ULNAR NERVE DECOMPRESSION/TRANSPOSITION;  Surgeon: [name], MD;   Location: [facility] ORS;  Service: Orthopedics;  Laterality: Right;   UPPER GI ENDOSCOPY     VAGINAL HYSTERECTOMY      Allergies  Allergies  Allergen Reactions   Morphine And Related Other (See Comments) and Nausea Only    Patient becomes very confused Other reaction(s): Other (See Comments), Other (See Comments) Patient becomes very confused Patient becomes very confused    Penicillins Hives, Shortness Of Breath, Swelling and Other (See Comments)    Facial swelling Has patient had a PCN reaction causing immediate rash, facial/tongue/throat swelling, SOB or lightheadedness with hypotension: Yes Has patient had a PCN reaction causing severe rash involving mucus membranes or skin necrosis: Yes Has patient had a PCN reaction that required hospitalization No Has  patient had a PCN reaction occurring within the last 10 years: No If all of the above answers are "NO", then may proceed with Cephalosporin use.  Other reaction(s): Other (See Comments), Other (See Comments) Facial swelling Has patient had a PCN reaction causing immediate rash, facial/tongue/throat swelling, SOB or lightheadedness with hypotension: Yes Has patient had a PCN reaction causing severe rash involving mucus membranes or skin necrosis: Yes Has patient had a PCN reaction that required hospitalization No Has patient had a PCN reaction occurring within the last 10 years: No If all of the above answers are "NO", then may proceed with Cephalosporin use. Facial swelling Has patient had a PCN reaction causing immediate rash, facial/tongue/throat swelling, SOB or lightheadedness with hypotension: Yes Has patient had a PCN reaction causing severe rash involving mucus membranes or skin necrosis: Yes Has patient had a PCN reaction that required hospitalization No Has patient had a PCN reaction occurring within the last 10 years: No If all of the above answers are "NO", then may proceed with Cephalosporin use. Facial swelling Has  patient had a PCN reaction causing immediate rash, facial/tongue/throat swelling, SOB or lightheadedness with hypotension: Yes Has patient had a PCN reaction causing severe rash involving mucus membranes or skin necrosis: Yes Has patient had a PCN reaction that required hospitalization No Has patient had a PCN reaction occurring within the last 10 years: No If all of the above answers are "NO", then may proceed with Cephalosporin use.    Ambien [Zolpidem]     Hallucination    Aspirin Hives   Oxytetracycline Hives    Other reaction(s): Unknown     History of Present Illness    ***  Home Medications    Current Outpatient Medications  Medication Sig Dispense Refill   acetaminophen (TYLENOL) 325 MG tablet Take 2 tablets (650 mg total) by mouth every 6 (six) hours as needed for mild pain or moderate pain (or Fever >/= 101).     albuterol (PROVENTIL) (2.5 MG/3ML) 0.083% nebulizer solution Take 3 mLs (2.5 mg total) by nebulization every 4 (four) hours as needed for wheezing or shortness of breath. 75 mL 12   ALPRAZolam (XANAX) 0.5 MG tablet TAKE 1/2 TABLET BY MOUTH TWICE DAILY AS NEEDED FOR ANXIETY 60 tablet 5   apixaban (ELIQUIS) 5 MG TABS tablet Take 1 tablet (5 mg total) by mouth 2 (two) times daily. 180 tablet 2   buPROPion (WELLBUTRIN XL) 150 MG 24 hr tablet Take 1 tablet (150 mg total) by mouth daily. 90 tablet 3   calcium-vitamin D (OSCAL WITH D) 500-200 MG-UNIT tablet Take 1 tablet by mouth 2 (two) times daily with a meal. 60 tablet 0   cholecalciferol (VITAMIN D) 25 MCG tablet Take 1 tablet (1,000 Units total) by mouth 2 (two) times daily with a meal. 30 tablet 2   clobetasol cream (TEMOVATE) 5.78 % Apply 1 application topically 2 (two) times daily as needed (skin irritation).      colchicine 0.6 MG tablet Take 1 tablet (0.6 mg total) by mouth as needed. May repeat 1 pill in 1 hr. No more than 3 total pills 1.8 mg in 24 hours as needed gout flare 30 tablet 2   collagenase (SANTYL)  ointment Apply 1 application topically at bedtime. X7-10 days 30 g 0   diclofenac Sodium (VOLTAREN) 1 % GEL Apply 2 g topically 3 (three) times daily. 100 g 2   doxycycline (VIBRA-TABS) 100 MG tablet Take 1 tablet (100 mg total) by mouth 2 (  two) times daily. With food 20 tablet 0   DULoxetine (CYMBALTA) 60 MG capsule Take 1 capsule (60 mg total) by mouth daily. 90 capsule 3   ergocalciferol (VITAMIN D2) 1.25 MG (50000 UT) capsule Take 50,000 Units by mouth once a week.     estradiol (ESTRACE) 0.1 MG/GM vaginal cream Place 1 Applicatorful vaginally every Monday, Wednesday, and Friday. Apply 0.5mg  (pea-sized amount)  just inside the vaginal introitus with a finger-tip on Monday, Wednesday and Friday nights, 42.5 g 11   fesoterodine (TOVIAZ) 8 MG TB24 tablet Take 1 tablet (8 mg total) by mouth daily. 90 tablet 3   fluticasone-salmeterol (ADVAIR HFA) 115-21 MCG/ACT inhaler Inhale 2 puffs into the lungs daily. Rinse mouth thoroughly after use 1 each 12   folic acid (FOLVITE) 1 MG tablet Take 1 tablet (1 mg total) by mouth daily. 90 tablet 3   HYDROcodone-acetaminophen (NORCO/VICODIN) 5-325 MG tablet Take 1 tablet by mouth 2 (two) times daily as needed for severe pain. Must last 30 days 45 tablet 0   HYDROcodone-acetaminophen (NORCO/VICODIN) 5-325 MG tablet Take 1 tablet by mouth 2 (two) times daily as needed for severe pain. Must last 30 days 45 tablet 0   [START ON 01/12/2021] HYDROcodone-acetaminophen (NORCO/VICODIN) 5-325 MG tablet Take 1 tablet by mouth 2 (two) times daily as needed for severe pain. Must last 30 days 45 tablet 0   levocetirizine (XYZAL) 5 MG tablet Take 1 tablet (5 mg total) by mouth at bedtime as needed for allergies. 90 tablet 3   levothyroxine (SYNTHROID) 175 MCG tablet Take 1 tablet (175 mcg total) by mouth daily before breakfast. On SUNDAYS take 1/2 pill 90 tablet 3   metFORMIN (GLUCOPHAGE) 500 MG tablet Take 1 tablet (500 mg total) by mouth daily with breakfast. With food 90  tablet 3   metoprolol succinate (TOPROL XL) 25 MG 24 hr tablet Take 1 tablet (25 mg total) by mouth daily. 90 tablet 1   Misc. Devices (TUB TRANSFER BOARD) MISC 1 Device by Does not apply route as needed. 1 each 0   montelukast (SINGULAIR) 10 MG tablet Take 1 tablet (10 mg total) by mouth at bedtime. 90 tablet 3   Multiple Vitamin (MULTIVITAMIN WITH MINERALS) TABS tablet Take 1 tablet by mouth daily.     mupirocin ointment (BACTROBAN) 2 % Apply 1 application topically 2 (two) times daily. Right lower leg 30 g 2   ondansetron (ZOFRAN) 4 MG tablet Take 1 tablet (4 mg total) by mouth every 8 (eight) hours as needed for nausea or vomiting. 90 tablet 1   oxybutynin (DITROPAN XL) 15 MG 24 hr tablet Take 1 tablet (15 mg total) by mouth at bedtime. 30 tablet 3   pantoprazole (PROTONIX) 40 MG tablet Take 40 mg by mouth daily.      pregabalin (LYRICA) 50 MG capsule TAKE 1 CAPSULE (50 MG) BY MOUTH 3 TIMES DAILY 90 capsule 2   rosuvastatin (CRESTOR) 20 MG tablet TAKE 1 TABLET BY  MOUTH ONCE DAILY 30 tablet 5   sitaGLIPtin (JANUVIA) 25 MG tablet Take 1 tablet (25 mg total) by mouth daily. 90 tablet 3   sucralfate (CARAFATE) 1 GM/10ML suspension Take 1 g by mouth 3 (three) times daily.      thiamine 100 MG tablet Take 0.5 tablets (50 mg total) by mouth 2 (two) times daily with a meal. 60 tablet 2   vitamin B-12 1000 MCG tablet Take 1 tablet (1,000 mcg total) by mouth daily. 30 tablet 2   No  current facility-administered medications for this visit.     Review of Systems    ***.  All other systems reviewed and are otherwise negative except as noted above.  Physical Exam    VS:  There were no vitals taken for this visit. , BMI There is no height or weight on file to calculate BMI.     GEN: Well nourished, well developed, in no acute distress. HEENT: normal. Neck: Supple, no JVD, carotid bruits, or masses. Cardiac: RRR, no murmurs, rubs, or gallops. No clubbing, cyanosis, edema.  Radials/DP/PT 2+ and  equal bilaterally.  Respiratory:  Respirations regular and unlabored, clear to auscultation bilaterally. GI: Soft, nontender, nondistended, BS + x 4. MS: no deformity or atrophy. Skin: warm and dry, no rash. Neuro:  Strength and sensation are intact. Psych: Normal affect.  Accessory Clinical Findings    ECG personally reviewed by me today - *** - no acute changes.  Lab Results  Component Value Date   WBC 7.5 08/22/2020   HGB 11.1 (L) 08/22/2020   HCT 34.5 (L) 08/22/2020   MCV 80.9 08/22/2020   PLT 257.0 08/22/2020   Lab Results  Component Value Date   CREATININE 1.19 10/15/2020   BUN 22 10/15/2020   NA 138 10/15/2020   K 4.8 10/15/2020   CL 102 10/15/2020   CO2 29 10/15/2020   Lab Results  Component Value Date   ALT 10 08/22/2020   AST 15 08/22/2020   ALKPHOS 100 08/22/2020   BILITOT 0.4 08/22/2020   Lab Results  Component Value Date   CHOL 65 04/07/2020   HDL 46 04/07/2020   LDLCALC 9 04/07/2020   TRIG 51 04/07/2020   CHOLHDL 1.4 04/07/2020    Lab Results  Component Value Date   HGBA1C 8.1 (H) 08/22/2020    Assessment & Plan    1.  ***   Murray Hodgkins, NP 12/20/2020, 8:53 AM

## 2020-12-20 NOTE — Telephone Encounter (Signed)
Ok to fill? Pt now seeing Urogyn

## 2020-12-23 DIAGNOSIS — H52223 Regular astigmatism, bilateral: Secondary | ICD-10-CM | POA: Diagnosis not present

## 2020-12-23 DIAGNOSIS — H524 Presbyopia: Secondary | ICD-10-CM | POA: Diagnosis not present

## 2020-12-23 DIAGNOSIS — E119 Type 2 diabetes mellitus without complications: Secondary | ICD-10-CM | POA: Diagnosis not present

## 2020-12-23 DIAGNOSIS — H2513 Age-related nuclear cataract, bilateral: Secondary | ICD-10-CM | POA: Diagnosis not present

## 2020-12-23 DIAGNOSIS — Z7984 Long term (current) use of oral hypoglycemic drugs: Secondary | ICD-10-CM | POA: Diagnosis not present

## 2020-12-23 LAB — HM DIABETES EYE EXAM

## 2020-12-24 DIAGNOSIS — I639 Cerebral infarction, unspecified: Secondary | ICD-10-CM | POA: Diagnosis not present

## 2020-12-24 DIAGNOSIS — R768 Other specified abnormal immunological findings in serum: Secondary | ICD-10-CM | POA: Diagnosis not present

## 2020-12-24 DIAGNOSIS — M25512 Pain in left shoulder: Secondary | ICD-10-CM | POA: Diagnosis not present

## 2020-12-24 DIAGNOSIS — G8929 Other chronic pain: Secondary | ICD-10-CM | POA: Diagnosis not present

## 2020-12-25 ENCOUNTER — Other Ambulatory Visit: Payer: Self-pay

## 2020-12-25 ENCOUNTER — Ambulatory Visit (INDEPENDENT_AMBULATORY_CARE_PROVIDER_SITE_OTHER): Payer: Medicare HMO | Admitting: Obstetrics and Gynecology

## 2020-12-25 VITALS — BP 155/77 | HR 45

## 2020-12-25 DIAGNOSIS — N393 Stress incontinence (female) (male): Secondary | ICD-10-CM

## 2020-12-25 DIAGNOSIS — N3281 Overactive bladder: Secondary | ICD-10-CM

## 2020-12-25 NOTE — Progress Notes (Signed)
[facility] Return Visit  SUBJECTIVE  History of Present Illness: [name] is a 70 y.o. female seen in follow-up for OAB and SUI. She was supposed to start PTNS today but wants to reschedule for when she is less busy in a few months.   Her pessary fell out a few hours after being home. She felt that she was wiping and brought it out. She would like it replaced.   Past Medical History: Patient  has a past medical history of Abnormal antibody titer, Anxiety and depression, Arthritis, Asthma, Atypical chest pain, Carotid arterial disease ([place]), Cystocele, Depression, Diabetes ([place]), DVT (deep venous thrombosis) ([place]), Dyspnea, Edema, GERD (gastroesophageal reflux disease), Heart murmur, History of IBS, History of kidney stones, History of methicillin resistant staphylococcus aureus (MRSA) (2008), History of multiple strokes, Hyperlipidemia, Hypertension, Hypothyroidism, Insomnia, Kidney stone, Neuropathy involving both lower extremities, Non-healing ulcer of left foot ([place]), Obesity, Class II, BMI 35-39.9, with comorbidity, PAF (paroxysmal atrial fibrillation) ([place]), Peripheral vascular disease ([place]), Sleep apnea, Urinary incontinence, and Venous stasis dermatitis of both lower extremities.   Past Surgical History: She  has a past surgical history that includes Ankle surgery (Right); Vaginal hysterectomy; Sleeve Gastroplasty; Lithotripsy; Kidney surgery (Right); transthoracic echocardiogram (08/03/2013); transesophageal echocardiogram (08/08/2013); Tonsillectomy; Total knee arthroplasty (Left); Hiatal hernia repair; Cholecystectomy; I & D extremity (Left, 06/27/2015); Application if wound vac (Left, 06/27/2015); removal of left hematoma (Left); NM GATED MYOVIEW [facility] HX) (February 2017); Reverse shoulder arthroplasty (Right, 10/08/2015); Hernia repair; Ulnar nerve transposition (Right, 08/06/2016); arm surgery; Irrigation and debridement abscess (Left, 09/07/2016); Incision and drainage of  wound (Left, 09/25/2016); Application if wound vac (Left, 09/25/2016); Colonoscopy; Upper gi endoscopy; Esophagogastroduodenoscopy (egd) with propofol (N/A, 01/11/2017); Colonoscopy with propofol (N/A, 01/11/2017); Colonoscopy with propofol (N/A, 02/22/2017); OTHER SURGICAL HISTORY; Total shoulder replacement (Right); Joint replacement (2013); Colonoscopy with propofol (N/A, 05/31/2017); Lower Extremity Angiography (Left, 04/04/2018); Esophagogastroduodenoscopy (egd) with propofol (N/A, 10/25/2018); Incision and drainage of wound (Left, 11/09/2018); and Esophagogastroduodenoscopy (egd) with propofol (N/A, 11/29/2019).   Medications: She has a current medication list which includes the following prescription(s): acetaminophen, albuterol, alprazolam, apixaban, bupropion, calcium-vitamin d, vitamin d3, clobetasol cream, colchicine, collagenase, diclofenac sodium, doxycycline, duloxetine, ergocalciferol, estradiol, toviaz, advair hfa, folic acid, hydrocodone-acetaminophen, hydrocodone-acetaminophen, [START ON 01/12/2021] hydrocodone-acetaminophen, levocetirizine, levothyroxine, metformin, metoprolol succinate, tub transfer board, montelukast, multivitamin with minerals, mupirocin ointment, ondansetron, oxybutynin, pantoprazole, pregabalin, rosuvastatin, sitagliptin, sucralfate, thiamine, and cyanocobalamin.   Allergies: Patient is allergic to morphine and related, penicillins, ambien [zolpidem], aspirin, and oxytetracycline.   Social History: Patient  reports that she has never smoked. She has never used smokeless tobacco. She reports that she does not drink alcohol and does not use drugs.      OBJECTIVE     Physical Exam: Vitals:   12/25/20 1425  BP: (!) 155/77  Pulse: (!) 45   Gen: No apparent distress, A&O x 3.  Detailed Urogynecologic Evaluation/ pessary fitting:  Normal external genitalia. #1 incontinence dish placed but this did not fit. This pessary was removed and her previous #0 incontinence ring  with support pessary was placed. This fit well and was comfortable.   Prior exam showed: POP-Q:    POP-Q   -3                                            Aa   -3  Ba   -6                                              C    3                                            Gh   4                                            Pb   7                                            tvl    -2                                            Ap   -2                                            Bp                                                  D      ASSESSMENT AND PLAN    [name] is a 70 y.o. with:  1. SUI (stress urinary incontinence, female)   2. Overactive bladder    - incontinence ring pessary was replaced today. Could not increase size as the next size up did not fit well.  - she will return 3 months for pessary cleaning.  - she would like to do PTNS but wants to wait until the new year- will reschedule sessions.   Jaquita Folds, MD  Time spent: I spent 25 minutes dedicated to the care of this patient on the date of this encounter to include pre-visit review of records, face-to-face time with the patient and post visit documentation.

## 2020-12-26 ENCOUNTER — Telehealth: Payer: Self-pay

## 2020-12-26 ENCOUNTER — Telehealth: Payer: Self-pay | Admitting: Internal Medicine

## 2020-12-26 ENCOUNTER — Ambulatory Visit
Admission: RE | Admit: 2020-12-26 | Discharge: 2020-12-26 | Disposition: A | Discharge status: Home / Self Care | Payer: Medicare HMO | Source: Ambulatory Visit | Attending: Physician Assistant | Admitting: Physician Assistant

## 2020-12-26 DIAGNOSIS — R600 Localized edema: Secondary | ICD-10-CM | POA: Diagnosis not present

## 2020-12-26 DIAGNOSIS — M869 Osteomyelitis, unspecified: Secondary | ICD-10-CM | POA: Diagnosis not present

## 2020-12-26 DIAGNOSIS — M7989 Other specified soft tissue disorders: Secondary | ICD-10-CM | POA: Diagnosis not present

## 2020-12-26 DIAGNOSIS — M79672 Pain in left foot: Secondary | ICD-10-CM | POA: Diagnosis not present

## 2020-12-26 MED ORDER — GADOBUTROL 1 MMOL/ML IV SOLN
10.0000 mL | Freq: Once | INTRAVENOUS | Status: AC | PRN
  Administered 2020-12-26: 10 mL via INTRAVENOUS

## 2020-12-26 NOTE — Telephone Encounter (Signed)
Patient home health nurse calling in and states that she is having issues with a finger on her hand. Called to get Patient triaged.   Patient's middle finger on her right hand started swelling this past Tuesday evening. States the swelling is growing larger, redness, and warm to the touch. Pain rated 6/10, unable to pick things up with this hand due to the middle finger. No know injuries.   Transferred to access nurse.

## 2020-12-26 NOTE — Telephone Encounter (Signed)
Received a call back from Vicente Males at [facility]. She has taken down all of my questions concerning the patients medication list and states that she will contact her field team. They will contact us with the answers to the medication differences.

## 2020-12-26 NOTE — Telephone Encounter (Signed)
Providing Access nurse documentation. Access nurse states that Braley is c/o finger swelling and pain since 12/24/20 since seeing her Rheumatologist. Pt has an appointment tomorrow at [facility] wound center. Called to triage and left a message to call back.

## 2020-12-26 NOTE — Telephone Encounter (Signed)
Called to speak with a nurse of [place] to discuss Kilynn's plan of care/orders. Spoke with Verdene Lennert and was informed that all staff was in a meeting and will call back. Left name and number for a return call.  A difference of medication has been noted upon review of the included medication list. Need to confirm if patient is taking 25 or 50 mg of Januvia, if they are taking Metoprolol Succinate 25mg  once or 2 times daily as it is one time daily in epic, and to confirm if patient is taking the Losartan 50mg  - Hydrochlorothiazide 12.5mg  tablet as it is not included on the med list on Epic.   Other medication not included on our list is Drisdol, Ditropan, B12, and advair.  Pt Plan of Care form is in the [facility] [address] Orland Mustard folder on my desk and is to be reviewed by Dr. Derrel Nip upon completion.

## 2020-12-27 ENCOUNTER — Other Ambulatory Visit: Payer: Self-pay

## 2020-12-27 ENCOUNTER — Telehealth: Payer: Self-pay

## 2020-12-27 ENCOUNTER — Encounter: Payer: Medicare HMO | Attending: Physician Assistant | Admitting: Physician Assistant

## 2020-12-27 ENCOUNTER — Other Ambulatory Visit
Admission: RE | Admit: 2020-12-27 | Discharge: 2020-12-27 | Disposition: A | Discharge status: Home / Self Care | Payer: Medicare HMO | Source: Ambulatory Visit | Attending: Physician Assistant | Admitting: Physician Assistant

## 2020-12-27 DIAGNOSIS — Z7901 Long term (current) use of anticoagulants: Secondary | ICD-10-CM | POA: Insufficient documentation

## 2020-12-27 DIAGNOSIS — L97812 Non-pressure chronic ulcer of other part of right lower leg with fat layer exposed: Secondary | ICD-10-CM | POA: Diagnosis not present

## 2020-12-27 DIAGNOSIS — Z86718 Personal history of other venous thrombosis and embolism: Secondary | ICD-10-CM | POA: Diagnosis not present

## 2020-12-27 DIAGNOSIS — L03032 Cellulitis of left toe: Secondary | ICD-10-CM | POA: Insufficient documentation

## 2020-12-27 DIAGNOSIS — E11622 Type 2 diabetes mellitus with other skin ulcer: Secondary | ICD-10-CM | POA: Diagnosis not present

## 2020-12-27 DIAGNOSIS — L97526 Non-pressure chronic ulcer of other part of left foot with bone involvement without evidence of necrosis: Secondary | ICD-10-CM | POA: Diagnosis not present

## 2020-12-27 DIAGNOSIS — L97822 Non-pressure chronic ulcer of other part of left lower leg with fat layer exposed: Secondary | ICD-10-CM | POA: Diagnosis not present

## 2020-12-27 DIAGNOSIS — B999 Unspecified infectious disease: Secondary | ICD-10-CM | POA: Diagnosis not present

## 2020-12-27 DIAGNOSIS — I872 Venous insufficiency (chronic) (peripheral): Secondary | ICD-10-CM | POA: Diagnosis not present

## 2020-12-27 DIAGNOSIS — E11621 Type 2 diabetes mellitus with foot ulcer: Secondary | ICD-10-CM | POA: Insufficient documentation

## 2020-12-27 DIAGNOSIS — L97522 Non-pressure chronic ulcer of other part of left foot with fat layer exposed: Secondary | ICD-10-CM | POA: Insufficient documentation

## 2020-12-27 NOTE — Progress Notes (Signed)
KAMYLLE, AXELSON ([id number]) Visit Report for 12/27/2020 HBO Risk Assessment Details Patient Name: [name], [name] Date of Service: 12/27/2020 2:30 PM Medical Record Number: [id number] Patient Account Number: [id number] Date of Birth/Sex: [birth year] (70 y.o. F) Treating RN: Donnamarie Poag Primary Care Xzander Gilham: [name], Olivia Mackie Other Clinician: Referring Annalise Mcdiarmid: [name], Olivia Mackie Treating Darrin Koman/Extender: Skipper Cliche in Treatment: 26 HBO Risk Assessment Items Answer Any "Yes" answers must be brought to the hyperbaric physician's attention. Breathing or Lung problemso Yes Describe: COPD Seen a lung doctor for any breathing or lung problemso Yes If yes, provide the name of doctor: [facility] Currently use tobacco productso No Used tobacco products in the pasto No Heart problemso Yes Seen a doctor for any heart or blood pressure problemso Yes If yes, provide the name of doctor: Dr. Fletcher Anon Do you take water pills (diuretic)o No Diabeteso Yes On Diabetes pillo Yes If yes, last time taken: 12/27/20 On Insulino No Dialysiso No Eye problems like glaucomao No Ear problems or surgeryo No Sinus Problemso Yes Cancero No Confinement Anxiety (Claustrophobia- fear of confined places)o Yes Any medical implants/devices that are fully or partially implanted or attached to your bodyo No Pregnanto No Seizureso No Date of last: Chest x-ray: 04/05/20 EKG: 08/20/2020 CBC: 08/27/2020 Electronic Signature(s) Signed: 12/27/2020 3:50:54 PM By: Gretta Cool, BSN, RN, CWS, Kim RN, BSN Signed: 12/27/2020 4:12:32 PM By: Donnamarie Poag Entered By: Gretta Cool, BSN, RN, CWS, Kim on 12/27/2020 15:50:54

## 2020-12-27 NOTE — Progress Notes (Signed)
Dawn, [name] ([id number]) Visit Report for 12/27/2020 Arrival Information Details Patient Name: [name], [name] Date of Service: 12/27/2020 2:30 PM Medical Record Number: [id number] Patient Account Number: [id number] Date of Birth/Sex: February 04, 1951 (69 y.o. F) Treating RN: Donnamarie Poag Primary Care Halia Franey: [name], Olivia Mackie Other Clinician: Referring Sherri Levenhagen: [name], Olivia Mackie Treating Verita Kuroda/Extender: Skipper Cliche in Treatment: 25 Visit Information History Since Last Visit Added or deleted any medications: No Patient Arrived: Wheel Chair Had a fall or experienced change in No Arrival Time: 14:36 activities of daily living that may affect Accompanied By: husband risk of falls: Transfer Assistance: EasyPivot Patient Lift Hospitalized since last visit: No Patient Identification Verified: Yes Has Dressing in Place as Prescribed: Yes Secondary Verification Process Completed: Yes Pain Present Now: Yes Patient Has Alerts: Yes Patient Alerts: Patient on Blood Thinner Eliquis Diabetic II 06/2020 ABI L)1.22 R)1.14 Electronic Signature(s) Signed: 12/27/2020 4:12:32 PM By: Donnamarie Poag Entered By: Donnamarie Poag on 12/27/2020 14:38:48 [name], [name] ([id number]) -------------------------------------------------------------------------------- Clinic Level of Care Assessment Details Patient Name: [name] Date of Service: 12/27/2020 2:30 PM Medical Record Number: [id number] Patient Account Number: [id number] Date of Birth/Sex: 10-Mar-1951 (70 y.o. F) Treating RN: Donnamarie Poag Primary Care Gionni Freese: [name], Olivia Mackie Other Clinician: Referring Wilmetta Speiser: [name], Olivia Mackie Treating Abdalrahman Clementson/Extender: Skipper Cliche in Treatment: 26 Clinic Level of Care Assessment Items TOOL 1 Quantity Score '[]'  - Use when EandM and Procedure is performed on INITIAL visit 0 ASSESSMENTS - Nursing Assessment / Reassessment '[]'  - General Physical Exam (combine w/  comprehensive assessment (listed just below) when performed on new 0 pt. evals) '[]'  - 0 Comprehensive Assessment (HX, ROS, Risk Assessments, Wounds Hx, etc.) ASSESSMENTS - Wound and Skin Assessment / Reassessment '[]'  - Dermatologic / Skin Assessment (not related to wound area) 0 ASSESSMENTS - Ostomy and/or Continence Assessment and Care '[]'  - Incontinence Assessment and Management 0 '[]'  - 0 Ostomy Care Assessment and Management (repouching, etc.) PROCESS - Coordination of Care '[]'  - Simple Patient / Family Education for ongoing care 0 '[]'  - 0 Complex (extensive) Patient / Family Education for ongoing care '[]'  - 0 Staff obtains Programmer, systems, Records, Test Results / Process Orders '[]'  - 0 Staff telephones HHA, Nursing Homes / Clarify orders / etc '[]'  - 0 Routine Transfer to another Facility (non-emergent condition) '[]'  - 0 Routine Hospital Admission (non-emergent condition) '[]'  - 0 New Admissions / Biomedical engineer / Ordering NPWT, Apligraf, etc. '[]'  - 0 Emergency Hospital Admission (emergent condition) PROCESS - Special Needs '[]'  - Pediatric / Minor Patient Management 0 '[]'  - 0 Isolation Patient Management '[]'  - 0 Hearing / Language / Visual special needs '[]'  - 0 Assessment of Community assistance (transportation, D/C planning, etc.) '[]'  - 0 Additional assistance / Altered mentation '[]'  - 0 Support Surface(s) Assessment (bed, cushion, seat, etc.) INTERVENTIONS - Miscellaneous '[]'  - External ear exam 0 '[]'  - 0 Patient Transfer (multiple staff / Civil Service fast streamer / Similar devices) '[]'  - 0 Simple Staple / Suture removal (25 or less) '[]'  - 0 Complex Staple / Suture removal (26 or more) '[]'  - 0 Hypo/Hyperglycemic Management (do not check if billed separately) '[]'  - 0 Ankle / Brachial Index (ABI) - do not check if billed separately Has the patient been seen at the hospital within the last three years: Yes Total Score: 0 Level Of Care: ____ [name] ([id number]) Electronic  Signature(s) Signed: 12/27/2020 4:12:32 PM By: Donnamarie Poag Entered By: Donnamarie Poag on 12/27/2020 [place], [name] ([id number]) -------------------------------------------------------------------------------- Compression Therapy Details Patient Name:  [name] Date of Service: 12/27/2020 2:30 PM Medical Record Number: [id number] Patient Account Number: [id number] Date of Birth/Sex: 1950/06/03 (70 y.o. F) Treating RN: Donnamarie Poag Primary Care Garyn Waguespack: [name], Olivia Mackie Other Clinician: Referring Taylore Hinde: [name], Olivia Mackie Treating Kriya Westra/Extender: Skipper Cliche in Treatment: 26 Compression Therapy Performed for Wound Assessment: Wound #13 Right,Midline Lower Leg Performed By: Junius Argyle, RN Compression Type: Three Layer Post Procedure Diagnosis Same as Pre-procedure Electronic Signature(s) Signed: 12/27/2020 4:12:32 PM By: Donnamarie Poag Entered By: Donnamarie Poag on 12/27/2020 15:19:34 [name] ([id number]) -------------------------------------------------------------------------------- Encounter Discharge Information Details Patient Name: [name] Date of Service: 12/27/2020 2:30 PM Medical Record Number: [id number] Patient Account Number: [id number] Date of Birth/Sex: [birth year] (70 y.o. F) Treating RN: Donnamarie Poag Primary Care Hessie Varone: [name], Olivia Mackie Other Clinician: Referring Makailey Hodgkin: [name], Olivia Mackie Treating Tyera Hansley/Extender: Skipper Cliche in Treatment: 26 Encounter Discharge Information Items Post Procedure Vitals Discharge Condition: Stable Temperature (F): 98.1 Ambulatory Status: Wheelchair Pulse (bpm): 64 Discharge Destination: Home Respiratory Rate (breaths/min): 18 Transportation: Private Auto Blood Pressure (mmHg): 149/69 Accompanied By: husband Schedule Follow-up Appointment: Yes Clinical Summary of Care: Electronic Signature(s) Signed: 12/27/2020 4:12:32 PM By: Donnamarie Poag Entered By:  Donnamarie Poag on 12/27/2020 15:38:03 [name] ([id number]) -------------------------------------------------------------------------------- Lower Extremity Assessment Details Patient Name: [name] Date of Service: 12/27/2020 2:30 PM Medical Record Number: [id number] Patient Account Number: [id number] Date of Birth/Sex: 10-21-1950 (70 y.o. F) Treating RN: Donnamarie Poag Primary Care Erick Oxendine: [name], Olivia Mackie Other Clinician: Referring Timia Casselman: [name], Olivia Mackie Treating Osama Coleson/Extender: Skipper Cliche in Treatment: 26 Edema Assessment Assessed: [Left: Yes] [Right: Yes] Edema: [Left: Yes] [Right: Yes] Calf Left: Right: Point of Measurement: 31 cm From Medial Instep 34 cm 32 cm Ankle Left: Right: Point of Measurement: 11 cm From Medial Instep 17.5 cm 17.5 cm Knee To Floor Left: Right: From Medial Instep 40 cm 40 cm Vascular Assessment Pulses: Dorsalis Pedis Palpable: [Left:Yes] [Right:Yes] Electronic Signature(s) Signed: 12/27/2020 4:12:32 PM By: Donnamarie Poag Entered By: Donnamarie Poag on 12/27/2020 14:52:43 [place], [name] ([id number]) -------------------------------------------------------------------------------- Multi Wound Chart Details Patient Name: [name] Date of Service: 12/27/2020 2:30 PM Medical Record Number: [id number] Patient Account Number: [id number] Date of Birth/Sex: [birth year] (70 y.o. F) Treating RN: Donnamarie Poag Primary Care Jacqulyn Barresi: [name], Olivia Mackie Other Clinician: Referring Abdulrahman Bracey: [name], Olivia Mackie Treating Tanara Turvey/Extender: Skipper Cliche in Treatment: 26 Vital Signs Height(in): 65.5 Pulse(bpm): 20 Weight(lbs): 240 Blood Pressure(mmHg): 149/69 Body Mass Index(BMI): 39 Temperature(F): 98.1 Respiratory Rate(breaths/min): 18 Photos: Wound Location: Right, Midline Lower Leg Left, Distal, Anterior Metatarsal Left Lower Leg head third Wounding Event: Gradually Appeared Pressure Injury  Blister Primary Etiology: Venous Leg Ulcer Diabetic Wound/Ulcer of the Lower Venous Leg Ulcer Extremity Comorbid History: Asthma, Pneumothorax, Asthma, Pneumothorax, Asthma, Pneumothorax, Arrhythmia, Hypertension, Type II Arrhythmia, Hypertension, Type II Arrhythmia, Hypertension, Type II Diabetes, Gout, Osteoarthritis, Diabetes, Gout, Osteoarthritis, Diabetes, Gout, Osteoarthritis, Neuropathy Neuropathy Neuropathy Date Acquired: 10/21/2020 10/31/2020 12/13/2020 Weeks of Treatment: '8 4 2 ' Wound Status: Open Open Open Measurements L x W x D (cm) 0.9x0.2x0.1 0.8x0.8x0.4 0.2x0.2x0.1 Area (cm) : 0.141 0.503 0.031 Volume (cm) : 0.014 0.201 0.003 % Reduction in Area: 97.00% 35.90% 88.70% % Reduction in Volume: 97.00% -154.40% 88.90% Classification: Full Thickness Without Exposed Grade 2 Full Thickness Without Exposed Support Structures Support Structures Exudate Amount: Medium Medium Medium Exudate Type: Serosanguineous Serosanguineous Serosanguineous Exudate Color: red, brown red, brown red, brown Wound Margin: Flat and Intact N/A N/A Granulation Amount: Large (67-100%) Small (1-33%) Large (67-100%) Granulation Quality: Red, Pink Red, Pink Red,  Pink Necrotic Amount: Small (1-33%) Large (67-100%) Small (1-33%) Exposed Structures: Fat Layer (Subcutaneous Tissue): Fat Layer (Subcutaneous Tissue): Fat Layer (Subcutaneous Tissue): Yes Yes Yes Fascia: No Bone: Yes Fascia: No Tendon: No Fascia: No Tendon: No Muscle: No Tendon: No Muscle: No Joint: No Muscle: No Joint: No Bone: No Joint: No Bone: No Epithelialization: None N/A N/A Treatment Notes Electronic Signature(s) Signed: 12/27/2020 4:12:32 PM By: Donnamarie Poag Entered By: Donnamarie Poag on 12/27/2020 14:53:23 [name], [name] ([id number]) [name], [name] ([id number]) -------------------------------------------------------------------------------- Multi-Disciplinary Care Plan Details Patient Name: [name] Date of  Service: 12/27/2020 2:30 PM Medical Record Number: [id number] Patient Account Number: [id number] Date of Birth/Sex: 1950-09-18 (70 y.o. F) Treating RN: Donnamarie Poag Primary Care Iram Lundberg: [name], Olivia Mackie Other Clinician: Referring Fatmata Legere: [name], Olivia Mackie Treating Warwick Nick/Extender: Skipper Cliche in Treatment: 26 Active Inactive Wound/Skin Impairment Nursing Diagnoses: Knowledge deficit related to ulceration/compromised skin integrity Goals: Patient/caregiver will verbalize understanding of skin care regimen Date Initiated: 06/28/2020 Date Inactivated: 08/16/2020 Target Resolution Date: 07/28/2020 Goal Status: Met Ulcer/skin breakdown will have a volume reduction of 30% by week 4 Date Initiated: 06/28/2020 Date Inactivated: 08/16/2020 Target Resolution Date: 07/28/2020 Goal Status: Met Ulcer/skin breakdown will have a volume reduction of 50% by week 8 Date Initiated: 06/28/2020 Date Inactivated: 09/13/2020 Target Resolution Date: 08/28/2020 Goal Status: Met Ulcer/skin breakdown will have a volume reduction of 80% by week 12 Date Initiated: 06/28/2020 Target Resolution Date: 09/27/2020 Goal Status: Active Ulcer/skin breakdown will heal within 14 weeks Date Initiated: 06/28/2020 Target Resolution Date: 10/28/2020 Goal Status: Active Interventions: Assess patient/caregiver ability to obtain necessary supplies Assess patient/caregiver ability to perform ulcer/skin care regimen upon admission and as needed Assess ulceration(s) every visit Notes: Electronic Signature(s) Signed: 12/27/2020 3:50:26 PM By: Gretta Cool, BSN, RN, CWS, Kim RN, BSN Signed: 12/27/2020 4:12:32 PM By: Donnamarie Poag Entered By: Gretta Cool, BSN, RN, CWS, Kim on 12/27/2020 15:50:26 [name], [name] ([id number]) -------------------------------------------------------------------------------- Pain Assessment Details Patient Name: [name] Date of Service: 12/27/2020 2:30 PM Medical Record Number: [id number] Patient  Account Number: [id number] Date of Birth/Sex: 1950-08-22 (70 y.o. F) Treating RN: Donnamarie Poag Primary Care Emmer Lillibridge: [name], Olivia Mackie Other Clinician: Referring Sammie Schermerhorn: [name], Olivia Mackie Treating Nejla Reasor/Extender: Skipper Cliche in Treatment: 26 Active Problems Location of Pain Severity and Description of Pain Patient Has Paino Yes Site Locations Pain Location: Generalized Pain, Pain in Ulcers Rate the pain. Current Pain Level: 5 Pain Management and Medication Current Pain Management: Electronic Signature(s) Signed: 12/27/2020 4:12:32 PM By: Donnamarie Poag Entered By: Donnamarie Poag on 12/27/2020 14:40:46 [name] ([id number]) -------------------------------------------------------------------------------- Patient/Caregiver Education Details Patient Name: [name] Date of Service: 12/27/2020 2:30 PM Medical Record Number: [id number] Patient Account Number: [id number] Date of Birth/Gender: 02/28/1951 (70 y.o. F) Treating RN: Donnamarie Poag Primary Care Physician: [name], Olivia Mackie Other Clinician: Referring Physician: [name], Olivia Mackie Treating Physician/Extender: Skipper Cliche in Treatment: 47 Education Assessment Education Provided To: Patient Education Topics Provided Basic Hygiene: Venous: Wound/Skin Impairment: Engineer, maintenance) Signed: 12/27/2020 4:12:32 PM By: Donnamarie Poag Entered By: Donnamarie Poag on 12/27/2020 15:09:22 [name] ([id number]) -------------------------------------------------------------------------------- Wound Assessment Details Patient Name: [name] Date of Service: 12/27/2020 2:30 PM Medical Record Number: [id number] Patient Account Number: [id number] Date of Birth/Sex: [birth year] (70 y.o. F) Treating RN: Donnamarie Poag Primary Care Shadow Stiggers: [name], Olivia Mackie Other Clinician: Referring Branden Shallenberger: [name], Olivia Mackie Treating Guhan Bruington/Extender: Skipper Cliche in Treatment: 26 Wound  Status Wound Number: 13 Primary Venous Leg Ulcer Etiology: Wound Location: Right, Midline Lower Leg Wound Open Wounding Event: Gradually Appeared  Status: Date Acquired: 10/21/2020 Comorbid Asthma, Pneumothorax, Arrhythmia, Hypertension, Type Weeks Of Treatment: 8 History: II Diabetes, Gout, Osteoarthritis, Neuropathy Clustered Wound: No Photos Wound Measurements Length: (cm) 0.9 Width: (cm) 0.2 Depth: (cm) 0.1 Area: (cm) 0.141 Volume: (cm) 0.014 % Reduction in Area: 97% % Reduction in Volume: 97% Epithelialization: None Tunneling: No Undermining: No Wound Description Classification: Full Thickness Without Exposed Support Structu Wound Margin: Flat and Intact Exudate Amount: Medium Exudate Type: Serosanguineous Exudate Color: red, brown res Foul Odor After Cleansing: No Slough/Fibrino Yes Wound Bed Granulation Amount: Large (67-100%) Exposed Structure Granulation Quality: Red, Pink Fascia Exposed: No Necrotic Amount: Small (1-33%) Fat Layer (Subcutaneous Tissue) Exposed: Yes Necrotic Quality: Adherent Slough Tendon Exposed: No Muscle Exposed: No Joint Exposed: No Bone Exposed: No Treatment Notes Wound #13 (Lower Leg) Wound Laterality: Right, Midline Cleanser Soap and Water Discharge Instruction: Gently cleanse wound with antibacterial soap, rinse and pat dry prior to dressing wounds Wound Cleanser [name], Dawn B. ([id number]) Discharge Instruction: Wash your hands with soap and water. Remove old dressing, discard into plastic bag and place into trash. Cleanse the wound with Wound Cleanser prior to applying a clean dressing using gauze sponges, not tissues or cotton balls. Do not scrub or use excessive force. Pat dry using gauze sponges, not tissue or cotton balls. Peri-Wound Care Topical Primary Dressing Hydrofera Blue Ready Transfer Foam, 2.5x2.5 (in/in) Discharge Instruction: Apply Hydrofera Blue Ready to wound bed as directed Secondary Dressing ABD Pad  5x9 (in/in) Discharge Instruction: Cover with ABD pad Secured With Compression Wrap Profore Lite LF 3 Multilayer Compression [place] Discharge Instruction: Apply 3 multi-layer wrap as prescribed. Compression Stockings Add-Ons Electronic Signature(s) Signed: 12/27/2020 4:12:32 PM By: Donnamarie Poag Entered By: Donnamarie Poag on 12/27/2020 14:51:02 [name] ([id number]) -------------------------------------------------------------------------------- Wound Assessment Details Patient Name: [name] Date of Service: 12/27/2020 2:30 PM Medical Record Number: [id number] Patient Account Number: [id number] Date of Birth/Sex: [birth year] (70 y.o. F) Treating RN: Cornell Barman Primary Care Gracyn Santillanes: [name], Olivia Mackie Other Clinician: Referring Gaynor Ferreras: [name], Olivia Mackie Treating Tahj Lindseth/Extender: Skipper Cliche in Treatment: 26 Wound Status Wound Number: 16 Primary Diabetic Wound/Ulcer of the Lower Extremity Etiology: Wound Location: Left, Distal Toe Fourth Wound Open Wounding Event: Pressure Injury Status: Date Acquired: 10/31/2020 Notes: stated toenail was removed by podiatry doctor on Weeks Of Treatment: 4 11/08/20 Clustered Wound: No Comorbid Asthma, Pneumothorax, Arrhythmia, Hypertension, Type History: II Diabetes, Gout, Osteoarthritis, Neuropathy Photos Wound Measurements Length: (cm) 0.8 Width: (cm) 0.8 Depth: (cm) 0.4 Area: (cm) 0.503 Volume: (cm) 0.201 % Reduction in Area: 35.9% % Reduction in Volume: -154.4% Wound Description Classification: Grade 2 Exudate Amount: Medium Exudate Type: Serosanguineous Exudate Color: red, brown Foul Odor After Cleansing: No Slough/Fibrino Yes Wound Bed Granulation Amount: Small (1-33%) Exposed Structure Granulation Quality: Red, Pink Fascia Exposed: No Necrotic Amount: Large (67-100%) Fat Layer (Subcutaneous Tissue) Exposed: Yes Necrotic Quality: Adherent Slough Tendon Exposed: No Muscle Exposed:  No Joint Exposed: No Bone Exposed: Yes Treatment Notes Wound #16 (Toe Fourth) Wound Laterality: Left, Anterior, Distal Cleanser Soap and Water Discharge Instruction: Gently cleanse wound with antibacterial soap, rinse and pat dry prior to dressing wounds Wound Cleanser [name], Dawn B. ([id number]) Discharge Instruction: Wash your hands with soap and water. Remove old dressing, discard into plastic bag and place into trash. Cleanse the wound with Wound Cleanser prior to applying a clean dressing using gauze sponges, not tissues or cotton balls. Do not scrub or use excessive force. Pat dry using gauze sponges, not tissue or cotton balls. Peri-Wound  Care Topical Primary Dressing Hydrofera Blue Ready Transfer Foam, 2.5x2.5 (in/in) Discharge Instruction: Apply Hydrofera Blue Ready to wound bed as directed Secondary Dressing ABD Pad 5x9 (in/in) Discharge Instruction: Cover with ABD pad or cut in half Secured With Mucarabones Surgical Tape, 2x2 (in/yd) Kerlix Roll Sterile or Non-Sterile 6-ply 4.5x4 (yd/yd) Discharge Instruction: Apply Kerlix as directed Compression Wrap Compression Stockings Add-Ons Electronic Signature(s) Signed: 12/27/2020 3:50:08 PM By: Gretta Cool, BSN, RN, CWS, Kim RN, BSN Entered By: Gretta Cool, BSN, RN, CWS, Kim on 12/27/2020 15:50:08 [name], [name] ([id number]) -------------------------------------------------------------------------------- Wound Assessment Details Patient Name: [name] Date of Service: 12/27/2020 2:30 PM Medical Record Number: [id number] Patient Account Number: [id number] Date of Birth/Sex: 30-Aug-1950 (70 y.o. F) Treating RN: Donnamarie Poag Primary Care Wanetta Funderburke: [name], Olivia Mackie Other Clinician: Referring Kennedey Digilio: [name], Olivia Mackie Treating Kaimen Peine/Extender: Skipper Cliche in Treatment: 26 Wound Status Wound Number: 17 Primary Venous Leg Ulcer Etiology: Wound Location: Left Lower Leg Wound Open Wounding  Event: Blister Status: Date Acquired: 12/13/2020 Comorbid Asthma, Pneumothorax, Arrhythmia, Hypertension, Type Weeks Of Treatment: 2 History: II Diabetes, Gout, Osteoarthritis, Neuropathy Clustered Wound: No Photos Wound Measurements Length: (cm) 0.2 Width: (cm) 0.2 Depth: (cm) 0.1 Area: (cm) 0.031 Volume: (cm) 0.003 % Reduction in Area: 88.7% % Reduction in Volume: 88.9% Wound Description Classification: Full Thickness Without Exposed Support Structures Exudate Amount: Medium Exudate Type: Serosanguineous Exudate Color: red, brown Foul Odor After Cleansing: No Slough/Fibrino Yes Wound Bed Granulation Amount: Large (67-100%) Exposed Structure Granulation Quality: Red, Pink Fascia Exposed: No Necrotic Amount: Small (1-33%) Fat Layer (Subcutaneous Tissue) Exposed: Yes Necrotic Quality: Adherent Slough Tendon Exposed: No Muscle Exposed: No Joint Exposed: No Bone Exposed: No Treatment Notes Wound #17 (Lower Leg) Wound Laterality: Left Cleanser Normal Saline Discharge Instruction: Wash your hands with soap and water. Remove old dressing, discard into plastic bag and place into trash. Cleanse the wound with Normal Saline prior to applying a clean dressing using gauze sponges, not tissues or cotton balls. Do not scrub or use excessive force. Pat dry using gauze sponges, not tissue or cotton balls. [name], Dawn ([id number]) Peri-Wound Care Topical Primary Dressing Hydrofera Blue Ready Transfer Foam, 2.5x2.5 (in/in) Discharge Instruction: Apply Hydrofera Blue Ready to wound bed as directed Secondary Dressing Coverlet Latex-Free Fabric Adhesive Dressings Discharge Instruction: 1.5 x 2 Secured With Compression Wrap Compression Stockings Add-Ons Electronic Signature(s) Signed: 12/27/2020 4:12:32 PM By: Donnamarie Poag Entered By: Donnamarie Poag on 12/27/2020 14:49:56 [name], [name]  ([id number]) -------------------------------------------------------------------------------- Vitals Details Patient Name: [name] Date of Service: 12/27/2020 2:30 PM Medical Record Number: [id number] Patient Account Number: [id number] Date of Birth/Sex: 01-26-1951 (70 y.o. F) Treating RN: Donnamarie Poag Primary Care Khyli Swaim: [name], Olivia Mackie Other Clinician: Referring Raiyan Dalesandro: [name], Olivia Mackie Treating Blaike Vickers/Extender: Skipper Cliche in Treatment: 26 Vital Signs Time Taken: 14:39 Temperature (F): 98.1 Height (in): 65.5 Pulse (bpm): 64 Weight (lbs): 240 Respiratory Rate (breaths/min): 18 Body Mass Index (BMI): 39.3 Blood Pressure (mmHg): 149/69 Reference Range: 80 - 120 mg / dl Electronic Signature(s) Signed: 12/27/2020 4:12:32 PM By: Donnamarie Poag Entered ByDonnamarie Poag on 12/27/2020 14:40:37

## 2020-12-27 NOTE — Telephone Encounter (Signed)
Confirmed faxed [facility] PT order form. Form has been sent to scan.

## 2020-12-27 NOTE — Progress Notes (Addendum)
JADDA, HUNSUCKER ([id number]) Visit Report for 12/27/2020 Chief Complaint Document Details Patient Name: [name], [name] Date of Service: 12/27/2020 2:30 PM Medical Record Number: [id number] Patient Account Number: [id number] Date of Birth/Sex: 05-18-1950 (70 y.o. F) Treating RN: Donnamarie Poag Primary Care Provider: [name], Olivia Mackie Other Clinician: Referring Provider: [name], Olivia Mackie Treating Provider/Extender: Skipper Cliche in Treatment: 26 Information Obtained from: Patient Chief Complaint Right Leg Ulcer and Right 4thToe Ulcer Electronic Signature(s) Signed: 12/27/2020 3:35:57 PM By: Worthy Keeler PA-C Previous Signature: 12/27/2020 2:41:15 PM Version By: Worthy Keeler PA-C Entered By: Worthy Keeler on 12/27/2020 15:35:56 Burford, [name] ([id number]) -------------------------------------------------------------------------------- Debridement Details Patient Name: [name] Date of Service: 12/27/2020 2:30 PM Medical Record Number: [id number] Patient Account Number: [id number] Date of Birth/Sex: May 23, 1950 (70 y.o. F) Treating RN: Donnamarie Poag Primary Care Provider: [name], Olivia Mackie Other Clinician: Referring Provider: [name], Olivia Mackie Treating Provider/Extender: Skipper Cliche in Treatment: 26 Debridement Performed for Wound #16 Left,Distal,Anterior Metatarsal head third Assessment: Performed By: Physician Tommie Sams., PA-C Debridement Type: Debridement Severity of Tissue Pre Debridement: Bone involvement without necrosis Level of Consciousness (Pre- Awake and Alert procedure): Pre-procedure Verification/Time Out Yes - 15:11 Taken: Start Time: 15:11 Pain Control: Lidocaine Total Area Debrided (L x W): 0.8 (cm) x 0.8 (cm) = 0.64 (cm) Tissue and other material Viable, Non-Viable, Slough, Subcutaneous, Slough debrided: Level: Skin/Subcutaneous Tissue Debridement Description: Excisional Instrument: Curette Specimen: Tissue  Culture Number of Specimens Taken: 1 Bleeding: Minimum Hemostasis Achieved: Pressure Response to Treatment: Procedure was tolerated well Level of Consciousness (Post- Awake and Alert procedure): Post Debridement Measurements of Total Wound Length: (cm) 0.8 Width: (cm) 0.8 Depth: (cm) 0.4 Volume: (cm) 0.201 Character of Wound/Ulcer Post Debridement: Requires Further Debridement Severity of Tissue Post Debridement: Bone involvement without necrosis Post Procedure Diagnosis Same as Pre-procedure Electronic Signature(s) Signed: 12/27/2020 4:12:32 PM By: Donnamarie Poag Signed: 12/27/2020 6:01:56 PM By: Worthy Keeler PA-C Entered By: Donnamarie Poag on 12/27/2020 15:18:53 Diep, [name] ([id number]) -------------------------------------------------------------------------------- HPI Details Patient Name: [name] Date of Service: 12/27/2020 2:30 PM Medical Record Number: [id number] Patient Account Number: [id number] Date of Birth/Sex: 07-Jun-1950 (70 y.o. F) Treating RN: Donnamarie Poag Primary Care Provider: [name], Olivia Mackie Other Clinician: Referring Provider: [name], Olivia Mackie Treating Provider/Extender: Skipper Cliche in Treatment: 26 History of Present Illness HPI Description: 06/28/20 upon evaluation today patient appears to be doing unfortunately somewhat poorly in regard to wounds that she has over her lower extremities. She has a wound on the left distal second toe, left posterior heel, and right anterior lower leg. With that being said all in all none of the wounds appear to be too bad the one that hurts her the most is actually the wound on the posterior heel which actually is also one of the smallest. Nonetheless I think there is some callus hiding what is really going on in this area. Fortunately there does not appear to be any obvious evidence of infection which is great news. Patient does have a history of diabetes mellitus type 2, chronic venous  insufficiency, hypertension, history of DVTs, and is on long-term anticoagulant therapy, Eliquis. 07/05/20 upon evaluation today patient appears to be doing well currently with regard to her wounds. I feel like she is making progress which is great news and overall there is no signs of active infection at this time. No fevers, chills, nausea, vomiting, or diarrhea. 4/29; patient has a only a small area remaining on the tip of her left second toe and  a small area remaining on the left heel. The area on the right anterior lower leg is healed. They tell me that she had remote podiatric surgery on the toes of the left foot however they are very fixed in position and I wonder whether they are making it difficult to relieve pressure in her foot wear. She is a type II diabetic with neuropathy as well. 08/01/2020 upon evaluation today patient appears to be doing well currently in regard to her wounds. In fact everything appears to be doing much better. The posterior aspect of her heel is actually giving her some trouble not the Achilles area but a small wound that we did not even have marked. In fact I had noted this before. Nonetheless we are going to addressed that today. 5/27; most of the patient's wounds are healed including the left Achilles heel left toe. Right forearm is also healed. She has a new abrasion posteriorly in the right elbow area just above the olecranon this happened today with an abrasion from her car door 6/15; the patient's wounds on the left Achilles and left second toe remain healed. The area on the right forearm is also just about healed in fact the skin I replaced last time is looking viable which is good. She had me look at the left heel again which is not in the area of the wound more towards the tip of the heel at the Achilles insertion site as well as the tip of the left foot second toe. In the Achilles area that is clearly is friction probably in bed at night. Not really near  where the wound was. The left second toe remains healed although this is hammered and overhanging first toe also puts pressure in this area 09/13/2020 upon evaluation today patient appears to be doing well with regard to all previous wounds that she had. Everything is completely healed. With that being said I do think that the patient unfortunately has a new skin tear on the right forearm which is due to her put a Band-Aid on it and then when it pulled off this caused some issues with skin tearing. Nonetheless I think that this needs to be addressed today. Hopefully will take too long to get this to heal. She is having some issues with pain in her heel but I think this is more related to be honest to her neuropathy as opposed to anything else. Readmission: 10/30/2020 upon evaluation today patient appears to be doing poorly in regard to her legs bilaterally. She is also having an issue with her fourth toe on the left. Fortunately there does not appear to be any signs of active infection at this time. No fevers, chills, nausea, vomiting, or diarrhea. It is actually been quite a while since we have seen her. In fact June 24 was the last time that she was here in the clinic. She tells me she did not come back because things were just doing "pretty well". 11/29/2020 upon evaluation today patient actually appears to be doing well with regard to her arms those are completely healed. Her right leg is still open but doing okay. The fourth toe of the left foot does show some signs here of having some issues with necrotic tissue of an open wound. This is where a toenail was removed by podiatry. Subsequently I do think that we need to go ahead and see what we can do about getting this cleared away so being try to get some healing going forward.  12/13/2020 upon evaluation today patient appears to be doing decently well in regard to her wound on the right leg. In general I think that she is making good progress here.  Unfortunately she continues to have significant issues with swelling in the left leg is now even more swollen at this point. For that reason I do believe she would be benefited by wrapping both sides although she is not interested in doing this. Overall I think that she is definitely making some good progress here nonetheless in regard to the right leg left leg leave some to be desired. 12/27/2020 upon evaluation today patient appears to be doing well with regard to her wound in regard to the legs. Unfortunately the toe was not doing nearly as well. I am much more concerned about infection and osteomyelitis here. She had the MRI but right now were not seeing obvious evidence of redness spreading up to her foot this is good news I do not have the result of the MRI as of yet. I am good to place her on doxycycline however due to the fact that the wound does seem to be getting a little worse in regard to her toe. Electronic Signature(s) Signed: 12/27/2020 5:54:52 PM By: Worthy Keeler PA-C Previous Signature: 12/27/2020 3:36:19 PM Version By: Worthy Keeler PA-C Entered By: Worthy Keeler on 12/27/2020 17:54:52 Ammirati, [name] ([id number]) -------------------------------------------------------------------------------- Physical Exam Details Patient Name: [name] Date of Service: 12/27/2020 2:30 PM Medical Record Number: [id number] Patient Account Number: [id number] Date of Birth/Sex: [birth year] (70 y.o. F) Treating RN: Donnamarie Poag Primary Care Provider: [name], Olivia Mackie Other Clinician: Referring Provider: [name], Olivia Mackie Treating Provider/Extender: Skipper Cliche in Treatment: 52 Constitutional Well-nourished and well-hydrated in no acute distress. Respiratory normal breathing without difficulty. Psychiatric this patient is able to make decisions and demonstrates good insight into disease process. Alert and Oriented x 3. pleasant and cooperative. Notes Upon  inspection patient's wound bed actually showed signs of good granulation and epithelization at this point. Fortunately there does not appear to be any signs of infection this is regard to her legs. In regard to the fourth toe left foot this is not doing nearly as well. Working to have to send the bone biopsy for culture I did not have enough today for pathology and I wanted to wait for the MRI result before proceeding as such if we did need to perform a deeper biopsy of the bone and I did not want to to be honest. Nonetheless we will proceed with that if it becomes necessary depending on the results of the MRI. Electronic Signature(s) Signed: 12/27/2020 5:55:39 PM By: Worthy Keeler PA-C Entered By: Worthy Keeler on 12/27/2020 17:55:39 KRISNA, OMAR ([id number]) -------------------------------------------------------------------------------- Physician Orders Details Patient Name: [name] Date of Service: 12/27/2020 2:30 PM Medical Record Number: [id number] Patient Account Number: [id number] Date of Birth/Sex: 12-02-1950 (70 y.o. F) Treating RN: Donnamarie Poag Primary Care Provider: [name], Olivia Mackie Other Clinician: Referring Provider: [name], Olivia Mackie Treating Provider/Extender: Skipper Cliche in Treatment: 15 Verbal / Phone Orders: No Diagnosis Coding ICD-10 Coding Code Description E11.621 Type 2 diabetes mellitus with foot ulcer I87.2 Venous insufficiency (chronic) (peripheral) Z79.01 Long term (current) use of anticoagulants L97.812 Non-pressure chronic ulcer of other part of right lower leg with fat layer exposed L97.522 Non-pressure chronic ulcer of other part of left foot with fat layer exposed Follow-up Appointments o Return Appointment in 1 week. o Nurse Visit as needed [place] [address] Right,Midline  [place]: - [place] for wound care. May utilize formulary equivalent dressing for wound  treatment orders unless otherwise specified. Home Health Nurse may visit PRN to address patientos wound care needs. o Scheduled days for dressing changes to be completed; exception, patient has scheduled wound care visit that day. o **Please direct any NON-WOUND related issues/requests for orders to patient's Primary Care Physician. **If current dressing causes regression in wound condition, may D/C ordered dressing product/s and apply Normal Saline Moist Dressing daily until next [place] or Other MD appointment. **Notify [facility] of regression in wound condition at [phone]. Bathing/ Shower/ Hygiene o May shower with wound dressing protected with water repellent cover or cast protector. o No tub bath. Edema Control - Lymphedema / Segmental Compressive Device / Other o Patient to wear own compression stockings. Remove compression stockings every night before going to bed and put on every morning when getting up. - WHEN NOT USING A COMPRESSION WRAP, USE COMPRESSION HOSE o Elevate, Exercise Daily and Avoid Standing for Long Periods of Time. o Elevate legs to the level of the heart and pump ankles as often as possible o Elevate leg(s) parallel to the floor when sitting. o DO YOUR BEST to sleep in the bed at night. DO NOT sleep in your recliner. Long hours of sitting in a recliner leads to swelling of the legs and/or potential wounds on your backside. Off-Loading Wound #16 Left,Distal Toe Fourth o Open toe surgical shoe - keep pressure off wound/toes o Other: - suggest float heels when in bed with a pillow to keep pressure off of heels; choose shoewear for right foot to not rub foot Additional Orders / Instructions o Follow Nutritious Diet and Increase Protein Intake Medications-Please add to medication list. o P.O. Antibiotics - Pick up and start antibiotics at pharmacy of choice Wound Treatment Wound #13 - Lower Leg Wound Laterality:  Right, Midline Cleanser: Soap and Water 3 x Per Week/30 Days Discharge Instructions: Gently cleanse wound with antibacterial soap, rinse and pat dry prior to dressing wounds Cleanser: Wound Cleanser 3 x Per Week/30 Days ZARIANNA, DICARLO B. ([id number]) Discharge Instructions: Wash your hands with soap and water. Remove old dressing, discard into plastic bag and place into trash. Cleanse the wound with Wound Cleanser prior to applying a clean dressing using gauze sponges, not tissues or cotton balls. Do not scrub or use excessive force. Pat dry using gauze sponges, not tissue or cotton balls. Primary Dressing: Hydrofera Blue Ready Transfer Foam, 2.5x2.5 (in/in) (Home Health) 3 x Per Week/30 Days Discharge Instructions: Apply Hydrofera Blue Ready to wound bed as directed Secondary Dressing: ABD Pad 5x9 (in/in) (Home Health) 3 x Per Week/30 Days Discharge Instructions: Cover with ABD pad Compression Wrap: Profore Lite LF 3 Multilayer Compression Bandaging System (Home Health) 3 x Per Week/30 Days Discharge Instructions: Apply 3 multi-layer wrap as prescribed. Wound #16 - Toe Fourth Wound Laterality: Left, Distal Cleanser: Soap and Water Every Other Day/30 Days Discharge Instructions: Gently cleanse wound with antibacterial soap, rinse and pat dry prior to dressing wounds Cleanser: Wound Cleanser Every Other Day/30 Days Discharge Instructions: Wash your hands with soap and water. Remove old dressing, discard into plastic bag and place into trash. Cleanse the wound with Wound Cleanser prior to applying a clean dressing using gauze sponges, not tissues or cotton balls. Do not scrub or use excessive force. Pat dry using gauze sponges, not tissue or cotton balls. Primary Dressing:  Hydrofera Blue Ready Transfer Foam, 2.5x2.5 (in/in) (Home Health) Every Other Day/30 Days Discharge Instructions: Apply Hydrofera Blue Ready to wound bed as directed Secondary Dressing: ABD Pad 5x9 (in/in) Every Other Day/30  Days Discharge Instructions: Cover with ABD pad or cut in half Secured With: 50M Medipore H Soft Cloth Surgical Tape, 2x2 (in/yd) Every Other Day/30 Days Secured With: [employer] Sterile or Non-Sterile 6-ply 4.5x4 (yd/yd) Every Other Day/30 Days Discharge Instructions: Apply Kerlix as directed Wound #17 - Lower Leg Wound Laterality: Left Cleanser: Normal Saline 3 x Per Week/30 Days Discharge Instructions: Wash your hands with soap and water. Remove old dressing, discard into plastic bag and place into trash. Cleanse the wound with Normal Saline prior to applying a clean dressing using gauze sponges, not tissues or cotton balls. Do not scrub or use excessive force. Pat dry using gauze sponges, not tissue or cotton balls. Primary Dressing: Hydrofera Blue Ready Transfer Foam, 2.5x2.5 (in/in) 3 x Per Week/30 Days Discharge Instructions: Apply Hydrofera Blue Ready to wound bed as directed Secondary Dressing: Coverlet Latex-Free Fabric Adhesive Dressings 3 x Per Week/30 Days Discharge Instructions: 1.5 x 2 Laboratory o Bacteria identified in Wound by Culture (MICRO) - left 4th toe oooo LOINC Code: 8527-7 oooo Convenience Name: Wound culture routine Electronic Signature(s) Signed: 12/27/2020 6:01:56 PM By: Worthy Keeler PA-C Signed: 01/22/2021 12:36:28 PM By: Gretta Cool, BSN, RN, CWS, Kim RN, BSN Entered By: Gretta Cool, BSN, RN, CWS, Kim on 12/27/2020 15:51:53 FELIZA, DIVEN ([id number]) -------------------------------------------------------------------------------- Problem List Details Patient Name: [name] Date of Service: 12/27/2020 2:30 PM Medical Record Number: [id number] Patient Account Number: [id number] Date of Birth/Sex: Mar 15, 1951 (70 y.o. F) Treating RN: Donnamarie Poag Primary Care Provider: [name], Olivia Mackie Other Clinician: Referring Provider: [name], Olivia Mackie Treating Provider/Extender: Skipper Cliche in Treatment: 26 Active Problems ICD-10 Encounter Code  Description Active Date MDM Diagnosis E11.621 Type 2 diabetes mellitus with foot ulcer 06/28/2020 No Yes I87.2 Venous insufficiency (chronic) (peripheral) 06/28/2020 No Yes Z79.01 Long term (current) use of anticoagulants 06/28/2020 No Yes L97.812 Non-pressure chronic ulcer of other part of right lower leg with fat layer 06/28/2020 No Yes exposed L97.522 Non-pressure chronic ulcer of other part of left foot with fat layer 06/28/2020 No Yes exposed Inactive Problems ICD-10 Code Description Active Date Inactive Date I10 Essential (primary) hypertension 06/28/2020 06/28/2020 Z86.718 Personal history of other venous thrombosis and embolism 06/28/2020 06/28/2020 Resolved Problems ICD-10 Code Description Active Date Resolved Date L97.322 Non-pressure chronic ulcer of left ankle with fat layer exposed 06/28/2020 06/28/2020 S50.811A Abrasion of right forearm, initial encounter 10/30/2020 10/30/2020 S50.311D Abrasion of right elbow, subsequent encounter 08/16/2020 08/16/2020 Electronic Signature(s) NELI, FOFANA ([id number]) Signed: 12/27/2020 2:41:08 PM By: Worthy Keeler PA-C Entered By: Worthy Keeler on 12/27/2020 14:41:08 DOREENE, FORREY ([id number]) -------------------------------------------------------------------------------- Progress Note Details Patient Name: [name] Date of Service: 12/27/2020 2:30 PM Medical Record Number: [id number] Patient Account Number: [id number] Date of Birth/Sex: [birth year] (70 y.o. F) Treating RN: Donnamarie Poag Primary Care Provider: [name], Olivia Mackie Other Clinician: Referring Provider: [name], Olivia Mackie Treating Provider/Extender: Skipper Cliche in Treatment: 26 Subjective Chief Complaint Information obtained from Patient Right Leg Ulcer and Right 4thToe Ulcer History of Present Illness (HPI) 06/28/20 upon evaluation today patient appears to be doing unfortunately somewhat poorly in regard to wounds that she has over her lower extremities. She has a  wound on the left distal second toe, left posterior heel, and right anterior lower leg. With that being said all in all none of the  wounds appear to be too bad the one that hurts her the most is actually the wound on the posterior heel which actually is also one of the smallest. Nonetheless I think there is some callus hiding what is really going on in this area. Fortunately there does not appear to be any obvious evidence of infection which is great news. Patient does have a history of diabetes mellitus type 2, chronic venous insufficiency, hypertension, history of DVTs, and is on long-term anticoagulant therapy, Eliquis. 07/05/20 upon evaluation today patient appears to be doing well currently with regard to her wounds. I feel like she is making progress which is great news and overall there is no signs of active infection at this time. No fevers, chills, nausea, vomiting, or diarrhea. 4/29; patient has a only a small area remaining on the tip of her left second toe and a small area remaining on the left heel. The area on the right anterior lower leg is healed. They tell me that she had remote podiatric surgery on the toes of the left foot however they are very fixed in position and I wonder whether they are making it difficult to relieve pressure in her foot wear. She is a type II diabetic with neuropathy as well. 08/01/2020 upon evaluation today patient appears to be doing well currently in regard to her wounds. In fact everything appears to be doing much better. The posterior aspect of her heel is actually giving her some trouble not the Achilles area but a small wound that we did not even have marked. In fact I had noted this before. Nonetheless we are going to addressed that today. 5/27; most of the patient's wounds are healed including the left Achilles heel left toe. Right forearm is also healed. She has a new abrasion posteriorly in the right elbow area just above the olecranon this happened  today with an abrasion from her car door 6/15; the patient's wounds on the left Achilles and left second toe remain healed. The area on the right forearm is also just about healed in fact the skin I replaced last time is looking viable which is good. She had me look at the left heel again which is not in the area of the wound more towards the tip of the heel at the Achilles insertion site as well as the tip of the left foot second toe. In the Achilles area that is clearly is friction probably in bed at night. Not really near where the wound was. The left second toe remains healed although this is hammered and overhanging first toe also puts pressure in this area 09/13/2020 upon evaluation today patient appears to be doing well with regard to all previous wounds that she had. Everything is completely healed. With that being said I do think that the patient unfortunately has a new skin tear on the right forearm which is due to her put a Band-Aid on it and then when it pulled off this caused some issues with skin tearing. Nonetheless I think that this needs to be addressed today. Hopefully will take too long to get this to heal. She is having some issues with pain in her heel but I think this is more related to be honest to her neuropathy as opposed to anything else. Readmission: 10/30/2020 upon evaluation today patient appears to be doing poorly in regard to her legs bilaterally. She is also having an issue with her fourth toe on the left. Fortunately there does not appear to  be any signs of active infection at this time. No fevers, chills, nausea, vomiting, or diarrhea. It is actually been quite a while since we have seen her. In fact June 24 was the last time that she was here in the clinic. She tells me she did not come back because things were just doing "pretty well". 11/29/2020 upon evaluation today patient actually appears to be doing well with regard to her arms those are completely healed. Her  right leg is still open but doing okay. The fourth toe of the left foot does show some signs here of having some issues with necrotic tissue of an open wound. This is where a toenail was removed by podiatry. Subsequently I do think that we need to go ahead and see what we can do about getting this cleared away so being try to get some healing going forward. 12/13/2020 upon evaluation today patient appears to be doing decently well in regard to her wound on the right leg. In general I think that she is making good progress here. Unfortunately she continues to have significant issues with swelling in the left leg is now even more swollen at this point. For that reason I do believe she would be benefited by wrapping both sides although she is not interested in doing this. Overall I think that she is definitely making some good progress here nonetheless in regard to the right leg left leg leave some to be desired. 12/27/2020 upon evaluation today patient appears to be doing well with regard to her wound in regard to the legs. Unfortunately the toe was not doing nearly as well. I am much more concerned about infection and osteomyelitis here. She had the MRI but right now were not seeing obvious evidence of redness spreading up to her foot this is good news I do not have the result of the MRI as of yet. I am good to place her on doxycycline however due to the fact that the wound does seem to be getting a little worse in regard to her toe. ZHANNA, MELIN B. ([id number]) Objective Constitutional Well-nourished and well-hydrated in no acute distress. Vitals Time Taken: 2:39 PM, Height: 65.5 in, Weight: 240 lbs, BMI: 39.3, Temperature: 98.1 F, Pulse: 64 bpm, Respiratory Rate: 18 breaths/min, Blood Pressure: 149/69 mmHg. Respiratory normal breathing without difficulty. Psychiatric this patient is able to make decisions and demonstrates good insight into disease process. Alert and Oriented x 3. pleasant and  cooperative. General Notes: Upon inspection patient's wound bed actually showed signs of good granulation and epithelization at this point. Fortunately there does not appear to be any signs of infection this is regard to her legs. In regard to the fourth toe left foot this is not doing nearly as well. Working to have to send the bone biopsy for culture I did not have enough today for pathology and I wanted to wait for the MRI result before proceeding as such if we did need to perform a deeper biopsy of the bone and I did not want to to be honest. Nonetheless we will proceed with that if it becomes necessary depending on the results of the MRI. Integumentary (Hair, Skin) Wound #13 status is Open. Original cause of wound was Gradually Appeared. The date acquired was: 10/21/2020. The wound has been in treatment 8 weeks. The wound is located on the Right,Midline Lower Leg. The wound measures 0.9cm length x 0.2cm width x 0.1cm depth; 0.141cm^2 area and 0.014cm^3 volume. There is Fat Layer (Subcutaneous Tissue)  exposed. There is no tunneling or undermining noted. There is a medium amount of serosanguineous drainage noted. The wound margin is flat and intact. There is large (67-100%) red, pink granulation within the wound bed. There is a small (1-33%) amount of necrotic tissue within the wound bed including Adherent Slough. Wound #16 status is Open. Original cause of wound was Pressure Injury. The date acquired was: 10/31/2020. The wound has been in treatment 4 weeks. The wound is located on the Left,Distal Toe Fourth. The wound measures 0.8cm length x 0.8cm width x 0.4cm depth; 0.503cm^2 area and 0.201cm^3 volume. There is bone and Fat Layer (Subcutaneous Tissue) exposed. There is a medium amount of serosanguineous drainage noted. There is small (1-33%) red, pink granulation within the wound bed. There is a large (67-100%) amount of necrotic tissue within the wound bed including Adherent Slough. Wound #17  status is Open. Original cause of wound was Blister. The date acquired was: 12/13/2020. The wound has been in treatment 2 weeks. The wound is located on the Left Lower Leg. The wound measures 0.2cm length x 0.2cm width x 0.1cm depth; 0.031cm^2 area and 0.003cm^3 volume. There is Fat Layer (Subcutaneous Tissue) exposed. There is a medium amount of serosanguineous drainage noted. There is large (67- 100%) red, pink granulation within the wound bed. There is a small (1-33%) amount of necrotic tissue within the wound bed including Adherent Slough. 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 There was a Excisional Skin/Subcutaneous Tissue Debridement with a total area of 0.64 sq cm performed by Tommie Sams., PA-C. With the following instrument(s): Curette to remove Viable and Non-Viable tissue/material. Material removed includes Subcutaneous Tissue and Slough and after achieving pain control using Lidocaine. 1 specimen was taken by a Tissue Culture and sent to the lab per facility protocol. A time out was conducted at 15:11, prior to the start of the procedure. A Minimum amount of bleeding was controlled with Pressure. The procedure was tolerated well. Post Debridement Measurements: 0.8cm length x 0.8cm width x 0.4cm depth; 0.201cm^3 volume. Character of Wound/Ulcer Post Debridement requires further debridement. Severity of Tissue Post Debridement is: Bone involvement  without necrosis. Post procedure Diagnosis Wound #16: Same as Pre-Procedure Disanti, Soriya B. ([id number]) Wound #13 Pre-procedure diagnosis of Wound #13 is a Venous Leg Ulcer located on the Right,Midline Lower Leg . There was a Three Layer Compression Therapy Procedure by Donnamarie Poag, RN. Post procedure Diagnosis Wound #13: Same as Pre-Procedure Plan Follow-up Appointments: Return Appointment in 1 week. Nurse Visit as needed Home Health: Wound #13 Right,Midline Lower Leg: [place]: - [place] for wound care. May utilize formulary equivalent dressing for wound treatment orders unless otherwise specified. Home Health Nurse may visit PRN to address patient s wound care needs. Scheduled days for dressing changes to be completed; exception, patient has scheduled wound care visit that day. **Please direct any NON-WOUND related issues/requests for orders to patient's Primary Care Physician. **If current dressing causes regression in wound condition, may D/C ordered dressing product/s and apply Normal Saline Moist Dressing daily  until next [place] or Other MD appointment. **Notify [facility] of regression in wound condition at [phone]. Bathing/ Shower/ Hygiene: May shower with wound dressing protected with water repellent cover or cast protector. No tub bath. Edema Control - Lymphedema / Segmental Compressive Device / Other: Patient to wear own compression stockings. Remove compression stockings every night before going to bed and put on every morning when getting up. - WHEN NOT USING A COMPRESSION WRAP, USE COMPRESSION HOSE Elevate, Exercise Daily and Avoid Standing for Long Periods of Time. Elevate legs to the level of the heart and pump ankles as often as possible Elevate leg(s) parallel to the floor when sitting. DO YOUR BEST to sleep in the bed at night. DO NOT sleep in your recliner. Long hours of sitting in a recliner leads  to swelling of the legs and/or potential wounds on your backside. Off-Loading: Wound #16 Left,Distal Toe Fourth: Open toe surgical shoe - keep pressure off wound/toes Other: - suggest float heels when in bed with a pillow to keep pressure off of heels; choose shoewear for right foot to not rub foot Additional Orders / Instructions: Follow Nutritious Diet and Increase Protein Intake Medications-Please add to medication list.: P.O. Antibiotics - Pick up and start antibiotics at pharmacy of choice Laboratory ordered were: Wound culture routine - left 4th toe WOUND #13: - Lower Leg Wound Laterality: Right, Midline Cleanser: Soap and Water 3 x Per Week/30 Days Discharge Instructions: Gently cleanse wound with antibacterial soap, rinse and pat dry prior to dressing wounds Cleanser: Wound Cleanser 3 x Per Week/30 Days Discharge Instructions: Wash your hands with soap and water. Remove old dressing, discard into plastic bag and place into trash. Cleanse the wound with Wound Cleanser prior to applying a clean dressing using gauze sponges, not tissues or cotton balls. Do not scrub or use excessive force. Pat dry using gauze sponges, not tissue or cotton balls. Primary Dressing: Hydrofera Blue Ready Transfer Foam, 2.5x2.5 (in/in) (Home Health) 3 x Per Week/30 Days Discharge Instructions: Apply Hydrofera Blue Ready to wound bed as directed Secondary Dressing: ABD Pad 5x9 (in/in) (Home Health) 3 x Per Week/30 Days Discharge Instructions: Cover with ABD pad Compression Wrap: Profore Lite LF 3 Multilayer Compression Bandaging System (Home Health) 3 x Per Week/30 Days Discharge Instructions: Apply 3 multi-layer wrap as prescribed. WOUND #16: - Toe Fourth Wound Laterality: Left, Distal Cleanser: Soap and Water Every Other Day/30 Days Discharge Instructions: Gently cleanse wound with antibacterial soap, rinse and pat dry prior to dressing wounds Cleanser: Wound Cleanser Every Other Day/30 Days Discharge  Instructions: Wash your hands with soap and water. Remove old dressing, discard into plastic bag and place into trash. Cleanse the wound with Wound Cleanser prior to applying a clean dressing using gauze sponges, not tissues or cotton balls. Do not scrub or use excessive force. Pat dry using gauze sponges, not tissue or cotton balls. Primary Dressing: Hydrofera Blue Ready Transfer Foam, 2.5x2.5 (in/in) (Home Health) Every Other Day/30 Days Discharge Instructions: Apply Hydrofera Blue Ready to wound bed as directed Secondary Dressing: ABD Pad 5x9 (in/in) Every Other Day/30 Days Discharge Instructions: Cover with ABD pad or cut in half Secured With: 81M Medipore H Soft Cloth Surgical Tape, 2x2 (in/yd) Every Other Day/30 Days Secured With: [employer] Sterile or Non-Sterile 6-ply 4.5x4 (yd/yd) Every Other Day/30 Days Discharge Instructions: Apply Kerlix as directed WOUND #17: - Lower Leg Wound Laterality: Left Cleanser: Normal Saline 3 x Per Week/30 Days Discharge Instructions: Wash your  hands with soap and water. Remove old dressing, discard into plastic bag and place into trash. Cleanse the wound with Normal Saline prior to applying a clean dressing using gauze sponges, not tissues or cotton balls. Do not scrub or use excessive force. Pat dry using gauze sponges, not tissue or cotton balls. Primary Dressing: Hydrofera Blue Ready Transfer Foam, 2.5x2.5 (in/in) 3 x Per Week/30 Days DARRAH, DREDGE B. ([id number]) Discharge Instructions: Apply Hydrofera Blue Ready to wound bed as directed Secondary Dressing: Coverlet Latex-Free Fabric Adhesive Dressings 3 x Per Week/30 Days Discharge Instructions: 1.5 x 2 1. Would recommend currently that we going continue with the wound care measures as before specifically with regard to the legs I think she is doing quite well. Overall I think that the main issue here is simply that she probably does have osteomyelitis of the fourth toe left foot though I am  awaiting the results of the MRI at this point. 2. I am also can recommend going and placing her on doxycycline and I Minna send that into the pharmacy for her that will be for the next 30 days. Depending on the results of the culture will make any adjustments as necessary this was a bone culture obtained today. Though the original plan was for doxycycline actually did have to make adjustments with regard to the antibiotic of choice due to the fact that there was an interaction with an allergy to the Terramycin with regard to the doxycycline. She is also allergic to penicillin. With that being said probably her best option in that case is going to be for [place] to do Bactrim which seems to have the lowest side effect profile as compared to any of the other medications in particular. At least any that she is not allergic to. Obviously will be judging the results of the culture to make any adjustments as necessary based on what we see in that regard. 3. We will also continue with the [facility] as the dressing of choice for the wounds I think that is good pretty much across the board for these ulcerations. 4. Depend on the results of her MRI and the culture we may proceed with hyperbaric oxygen therapy I think this could be beneficial as far as trying to get this healed we just need confirmation that indeed this is truly osteomyelitis. From a physical exam standpoint it seems to be obvious that that is what were dealing with. We will see patient back for reevaluation in 1 week here in the clinic. If anything worsens or changes patient will contact our office for additional recommendations. Electronic Signature(s) Signed: 12/27/2020 6:01:12 PM By: Worthy Keeler PA-C Entered By: Worthy Keeler on 12/27/2020 18:01:12 KALLIOPE, RIESEN ([id number]) -------------------------------------------------------------------------------- SuperBill Details Patient Name: [name] Date of Service:  12/27/2020 Medical Record Number: [id number] Patient Account Number: [id number] Date of Birth/Sex: 1950/06/28 (70 y.o. F) Treating RN: Donnamarie Poag Primary Care Provider: [name], Olivia Mackie Other Clinician: Referring Provider: [name], Olivia Mackie Treating Provider/Extender: Skipper Cliche in Treatment: 26 Diagnosis Coding ICD-10 Codes Code Description E11.621 Type 2 diabetes mellitus with foot ulcer I87.2 Venous insufficiency (chronic) (peripheral) Z79.01 Long term (current) use of anticoagulants L97.812 Non-pressure chronic ulcer of other part of right lower leg with fat layer exposed L97.522 Non-pressure chronic ulcer of other part of left foot with fat layer exposed Facility Procedures The patient participates with Medicare or their insurance follows the Medicare Facility Guidelines: CPT4 Code Description Modifier Quantity [id number] 11042 - DEB SUBQ TISSUE  20 SQ CM/< 1 The patient participates with Medicare or their insurance follows the Medicare Facility Guidelines: ICD-10 Diagnosis Description L97.522 Non-pressure chronic ulcer of other part of left foot with fat layer exposed Physician Procedures CPT4 Code: 6725500 Description: 99214 - WC PHYS LEVEL 4 - EST PT Modifier: 25 Quantity: 1 CPT4 Code: Description: ICD-10 Diagnosis Description E11.621 Type 2 diabetes mellitus with foot ulcer I87.2 Venous insufficiency (chronic) (peripheral) Z79.01 Long term (current) use of anticoagulants L97.812 Non-pressure chronic ulcer of other part of right lower  leg with fat laye Modifier: r exposed Quantity: CPT4 Code: 1642903 Description: 79558 - WC PHYS SUBQ TISS 20 SQ CM Modifier: Quantity: 1 CPT4 Code: Description: ICD-10 Diagnosis Description L97.522 Non-pressure chronic ulcer of other part of left foot with fat layer expo Modifier: sed Quantity: Electronic Signature(s) Signed: 12/27/2020 6:01:28 PM By: Worthy Keeler PA-C Previous Signature: 12/27/2020 4:12:32 PM Version By:  Donnamarie Poag Entered By: Worthy Keeler on 12/27/2020 18:01:28

## 2020-12-30 ENCOUNTER — Telehealth: Payer: Self-pay

## 2020-12-30 LAB — AEROBIC CULTURE W GRAM STAIN (SUPERFICIAL SPECIMEN)

## 2020-12-30 NOTE — Telephone Encounter (Signed)
Received an FYI from [place] that patient was seen at [place] on 12/27/20 and was given an antibiotic. Pt has reported that they are having a possible yeast infection and that the request for for medication was denied by Wound Care. Wound care will be faxing over the request and office notes to PCP for review and signing of medication orders. Waiting to receive fax.

## 2020-12-31 NOTE — Telephone Encounter (Signed)
Placed call to pt to let her know she'll have to be seen in order to be prescribed any medication.LMTCB

## 2020-12-31 NOTE — Telephone Encounter (Signed)
Offered patient an appointment for tomorrow with NP to evaluate need for Diflucan patient refused stating she would call the MD that prescribed the antibiotic at the Wound center to  see if they will prescribe. Explained to patient that we could not prescribe diflucan without evaluation, could be other causes to her vaginal itching. Patient still reused appointment.

## 2020-12-31 NOTE — Telephone Encounter (Signed)
Patient is returning your call about the message below to see if diflucan can be called in for her.Please call her at [phone].

## 2020-12-31 NOTE — Telephone Encounter (Signed)
Noted.  Let me know if problems or if needs me to do anything more.

## 2021-01-01 ENCOUNTER — Ambulatory Visit: Payer: Medicare HMO

## 2021-01-01 NOTE — Telephone Encounter (Signed)
Left a message to call back. Mychart message has been sent to triage for finger.

## 2021-01-02 NOTE — Telephone Encounter (Signed)
Received faxed message from [place] asking for Patients PCP to prescribe Diflucan. Form has been sent to scan.

## 2021-01-03 NOTE — Telephone Encounter (Signed)
Received a callback from [place] with [place]. She returned the call to discuss the medication on the Plan of Care form. Alyse Low states that Advair was sent in by Dr. Vella Kohler and the Vitamin D3 and B12 were placed by [facility]. Pt states that the Ditropan and the Losartan were placed by Dr. Lacinda Axon before the PCP changed to Dr. Olivia Mackie. Metoprolol has been discontinued. Alyse Low asks about Januvia and states that the pill bottle that Izora Gala received has instructions to take 25 mg 1 time daily with 60 tablets. She states that [place] has informed her that [place] spoke with her PCP and was instructed to take Januvia 2 times daily. Alyse Low asks if this is correct and if she should be taking 25 or 50 mg of Januvia. Sending to PCP.

## 2021-01-04 ENCOUNTER — Other Ambulatory Visit: Payer: Self-pay | Admitting: Urology

## 2021-01-04 ENCOUNTER — Other Ambulatory Visit: Payer: Self-pay | Admitting: Internal Medicine

## 2021-01-04 DIAGNOSIS — M797 Fibromyalgia: Secondary | ICD-10-CM

## 2021-01-04 DIAGNOSIS — E1142 Type 2 diabetes mellitus with diabetic polyneuropathy: Secondary | ICD-10-CM

## 2021-01-06 ENCOUNTER — Ambulatory Visit: Payer: Medicare HMO | Admitting: Physician Assistant

## 2021-01-06 NOTE — Telephone Encounter (Signed)
Last refill 10/11/20 for 90 with 2 refills last OV 10/15/20 okay to fill?

## 2021-01-07 ENCOUNTER — Ambulatory Visit: Payer: Medicare HMO | Admitting: Physician Assistant

## 2021-01-08 ENCOUNTER — Telehealth: Payer: Self-pay

## 2021-01-08 ENCOUNTER — Ambulatory Visit: Payer: Medicare HMO

## 2021-01-08 NOTE — Telephone Encounter (Signed)
Confirmed signed and faxed client coordination note to [facility]. Form sent to scan.

## 2021-01-08 NOTE — Telephone Encounter (Signed)
Confirmed signed and faxed client coordination note to [facility]. Form sent to scan.

## 2021-01-09 DIAGNOSIS — G4733 Obstructive sleep apnea (adult) (pediatric): Secondary | ICD-10-CM | POA: Diagnosis not present

## 2021-01-09 NOTE — Telephone Encounter (Signed)
Should the patient be taking 25 or 50 mg of Januvia. Please advise. Plan of care was placed on Providers desk.

## 2021-01-10 DIAGNOSIS — M19012 Primary osteoarthritis, left shoulder: Secondary | ICD-10-CM | POA: Diagnosis not present

## 2021-01-10 DIAGNOSIS — M7582 Other shoulder lesions, left shoulder: Secondary | ICD-10-CM | POA: Diagnosis not present

## 2021-01-11 DIAGNOSIS — G4733 Obstructive sleep apnea (adult) (pediatric): Secondary | ICD-10-CM | POA: Diagnosis not present

## 2021-01-13 ENCOUNTER — Other Ambulatory Visit: Payer: Self-pay | Admitting: Physician Assistant

## 2021-01-14 ENCOUNTER — Encounter: Payer: Medicare HMO | Admitting: Physician Assistant

## 2021-01-14 ENCOUNTER — Other Ambulatory Visit: Payer: Self-pay

## 2021-01-14 ENCOUNTER — Ambulatory Visit: Payer: Medicare HMO | Admitting: Obstetrics and Gynecology

## 2021-01-14 ENCOUNTER — Ambulatory Visit: Payer: Medicare HMO

## 2021-01-14 ENCOUNTER — Other Ambulatory Visit: Payer: Self-pay | Admitting: Internal Medicine

## 2021-01-14 DIAGNOSIS — I152 Hypertension secondary to endocrine disorders: Secondary | ICD-10-CM

## 2021-01-14 DIAGNOSIS — L97812 Non-pressure chronic ulcer of other part of right lower leg with fat layer exposed: Secondary | ICD-10-CM | POA: Diagnosis not present

## 2021-01-14 DIAGNOSIS — Z86718 Personal history of other venous thrombosis and embolism: Secondary | ICD-10-CM | POA: Diagnosis not present

## 2021-01-14 DIAGNOSIS — E11622 Type 2 diabetes mellitus with other skin ulcer: Secondary | ICD-10-CM | POA: Diagnosis not present

## 2021-01-14 DIAGNOSIS — L97522 Non-pressure chronic ulcer of other part of left foot with fat layer exposed: Secondary | ICD-10-CM | POA: Diagnosis not present

## 2021-01-14 DIAGNOSIS — L97526 Non-pressure chronic ulcer of other part of left foot with bone involvement without evidence of necrosis: Secondary | ICD-10-CM | POA: Diagnosis not present

## 2021-01-14 DIAGNOSIS — Z7901 Long term (current) use of anticoagulants: Secondary | ICD-10-CM | POA: Diagnosis not present

## 2021-01-14 DIAGNOSIS — I872 Venous insufficiency (chronic) (peripheral): Secondary | ICD-10-CM | POA: Diagnosis not present

## 2021-01-14 DIAGNOSIS — I10 Essential (primary) hypertension: Secondary | ICD-10-CM

## 2021-01-14 DIAGNOSIS — E11621 Type 2 diabetes mellitus with foot ulcer: Secondary | ICD-10-CM | POA: Diagnosis not present

## 2021-01-14 MED ORDER — SITAGLIPTIN PHOSPHATE 50 MG PO TABS: 50.0000 mg | ORAL_TABLET | Freq: Every day | ORAL | 3 refills | Status: DC

## 2021-01-14 MED ORDER — LOSARTAN POTASSIUM 50 MG PO TABS: 50.0000 mg | ORAL_TABLET | Freq: Every day | ORAL | 3 refills | Status: DC

## 2021-01-14 NOTE — Progress Notes (Addendum)
SAE, HANDRICH ([id number]) Visit Report for 01/14/2021 Chief Complaint Document Details Patient Name: [name], [name] Date of Service: 01/14/2021 2:30 PM Medical Record Number: [id number] Patient Account Number: [id number] Date of Birth/Sex: 07/18/1950 (70 y.o. F) Treating RN: Donnamarie Poag Primary Care Provider: [name], Olivia Mackie Other Clinician: Referring Provider: [name], Olivia Mackie Treating Provider/Extender: Skipper Cliche in Treatment: 28 Information Obtained from: Patient Chief Complaint Right Leg Ulcer and Right 4thToe Ulcer Electronic Signature(s) Signed: 01/14/2021 2:53:10 PM By: Worthy Keeler PA-C Entered By: Worthy Keeler on 01/14/2021 14:53:09 Crafts, [name] ([id number]) -------------------------------------------------------------------------------- Debridement Details Patient Name: [name] Date of Service: 01/14/2021 2:30 PM Medical Record Number: [id number] Patient Account Number: [id number] Date of Birth/Sex: 13-Feb-1951 (70 y.o. F) Treating RN: Donnamarie Poag Primary Care Provider: [name], Olivia Mackie Other Clinician: Referring Provider: [name], Olivia Mackie Treating Provider/Extender: Skipper Cliche in Treatment: 28 Debridement Performed for Wound #16 Left,Distal Toe Fourth Assessment: Performed By: Physician Tommie Sams., PA-C Debridement Type: Debridement Severity of Tissue Pre Debridement: Bone involvement without necrosis Level of Consciousness (Pre- Awake and Alert procedure): Pre-procedure Verification/Time Out Yes - 15:36 Taken: Start Time: 15:36 Pain Control: Lidocaine Total Area Debrided (L x W): 0.4 (cm) x 0.5 (cm) = 0.2 (cm) Tissue and other material Viable, Non-Viable, Bone, Slough, Subcutaneous, Slough debrided: Level: Skin/Subcutaneous Tissue/Muscle/Bone Debridement Description: Excisional Instrument: Curette Specimen: Tissue Culture Number of Specimens Taken: 1 Bleeding: Minimum Hemostasis  Achieved: Pressure Response to Treatment: Procedure was tolerated well Level of Consciousness (Post- Awake and Alert procedure): Post Debridement Measurements of Total Wound Length: (cm) 0.4 Width: (cm) 0.5 Depth: (cm) 0.2 Volume: (cm) 0.031 Character of Wound/Ulcer Post Debridement: Improved Severity of Tissue Post Debridement: Bone involvement without necrosis Post Procedure Diagnosis Same as Pre-procedure Electronic Signature(s) Signed: 01/14/2021 4:40:41 PM By: Donnamarie Poag Signed: 01/15/2021 4:54:49 PM By: Worthy Keeler PA-C Entered By: Donnamarie Poag on 01/14/2021 15:42:18 [name], [name] ([id number]) -------------------------------------------------------------------------------- HPI Details Patient Name: [name] Date of Service: 01/14/2021 2:30 PM Medical Record Number: [id number] Patient Account Number: [id number] Date of Birth/Sex: 02-23-1951 (70 y.o. F) Treating RN: Donnamarie Poag Primary Care Provider: [name], Olivia Mackie Other Clinician: Referring Provider: [name], Olivia Mackie Treating Provider/Extender: Skipper Cliche in Treatment: 28 History of Present Illness HPI Description: 06/28/20 upon evaluation today patient appears to be doing unfortunately somewhat poorly in regard to wounds that she has over her lower extremities. She has a wound on the left distal second toe, left posterior heel, and right anterior lower leg. With that being said all in all none of the wounds appear to be too bad the one that hurts her the most is actually the wound on the posterior heel which actually is also one of the smallest. Nonetheless I think there is some callus hiding what is really going on in this area. Fortunately there does not appear to be any obvious evidence of infection which is great news. Patient does have a history of diabetes mellitus type 2, chronic venous insufficiency, hypertension, history of DVTs, and is on long-term anticoagulant therapy,  Eliquis. 07/05/20 upon evaluation today patient appears to be doing well currently with regard to her wounds. I feel like she is making progress which is great news and overall there is no signs of active infection at this time. No fevers, chills, nausea, vomiting, or diarrhea. 4/29; patient has a only a small area remaining on the tip of her left second toe and a small area remaining on the left heel. The area on the right  anterior lower leg is healed. They tell me that she had remote podiatric surgery on the toes of the left foot however they are very fixed in position and I wonder whether they are making it difficult to relieve pressure in her foot wear. She is a type II diabetic with neuropathy as well. 08/01/2020 upon evaluation today patient appears to be doing well currently in regard to her wounds. In fact everything appears to be doing much better. The posterior aspect of her heel is actually giving her some trouble not the Achilles area but a small wound that we did not even have marked. In fact I had noted this before. Nonetheless we are going to addressed that today. 5/27; most of the patient's wounds are healed including the left Achilles heel left toe. Right forearm is also healed. She has a new abrasion posteriorly in the right elbow area just above the olecranon this happened today with an abrasion from her car door 6/15; the patient's wounds on the left Achilles and left second toe remain healed. The area on the right forearm is also just about healed in fact the skin I replaced last time is looking viable which is good. She had me look at the left heel again which is not in the area of the wound more towards the tip of the heel at the Achilles insertion site as well as the tip of the left foot second toe. In the Achilles area that is clearly is friction probably in bed at night. Not really near where the wound was. The left second toe remains healed although this is hammered and  overhanging first toe also puts pressure in this area 09/13/2020 upon evaluation today patient appears to be doing well with regard to all previous wounds that she had. Everything is completely healed. With that being said I do think that the patient unfortunately has a new skin tear on the right forearm which is due to her put a Band-Aid on it and then when it pulled off this caused some issues with skin tearing. Nonetheless I think that this needs to be addressed today. Hopefully will take too long to get this to heal. She is having some issues with pain in her heel but I think this is more related to be honest to her neuropathy as opposed to anything else. Readmission: 10/30/2020 upon evaluation today patient appears to be doing poorly in regard to her legs bilaterally. She is also having an issue with her fourth toe on the left. Fortunately there does not appear to be any signs of active infection at this time. No fevers, chills, nausea, vomiting, or diarrhea. It is actually been quite a while since we have seen her. In fact June 24 was the last time that she was here in the clinic. She tells me she did not come back because things were just doing "pretty well". 11/29/2020 upon evaluation today patient actually appears to be doing well with regard to her arms those are completely healed. Her right leg is still open but doing okay. The fourth toe of the left foot does show some signs here of having some issues with necrotic tissue of an open wound. This is where a toenail was removed by podiatry. Subsequently I do think that we need to go ahead and see what we can do about getting this cleared away so being try to get some healing going forward. 12/13/2020 upon evaluation today patient appears to be doing decently well in regard  to her wound on the right leg. In general I think that she is making good progress here. Unfortunately she continues to have significant issues with swelling in the left leg  is now even more swollen at this point. For that reason I do believe she would be benefited by wrapping both sides although she is not interested in doing this. Overall I think that she is definitely making some good progress here nonetheless in regard to the right leg left leg leave some to be desired. 12/27/2020 upon evaluation today patient appears to be doing well with regard to her wound in regard to the legs. Unfortunately the toe was not doing nearly as well. I am much more concerned about infection and osteomyelitis here. She had the MRI but right now were not seeing obvious evidence of redness spreading up to her foot this is good news I do not have the result of the MRI as of yet. I am good to place her on doxycycline however due to the fact that the wound does seem to be getting a little worse in regard to her toe. 01/14/2021 upon evaluation today patient appears to be doing well with regard to her ulcers. The right leg is doing well and even the toe seems to be doing well though she still has some evidence of bone exposed on the toe which does not appear to be doing quite as good as I would like to see just and the fact that is still not covered over new tissue but looks tremendously better compared to 2 weeks ago. Overall very pleased in that regard. Electronic Signature(s) Signed: 01/14/2021 5:21:02 PM By: Worthy Keeler PA-C Entered By: Worthy Keeler on 01/14/2021 17:21:01 [name], [name] ([id number]) -------------------------------------------------------------------------------- Callus Pairing Details Patient Name: [name] Date of Service: 01/14/2021 2:30 PM Medical Record Number: [id number] Patient Account Number: [id number] Date of Birth/Sex: 1950-04-26 (70 y.o. F) Treating RN: Donnamarie Poag Primary Care Provider: [name], Olivia Mackie Other Clinician: Referring Provider: [name], Olivia Mackie Treating Provider/Extender: Skipper Cliche in Treatment:  28 Procedure Performed for: NonWound Condition Other Dermatologic Condition - Left Foot Performed By: Physician Tommie Sams., PA-C Post Procedure Diagnosis Same as Pre-procedure Notes tolerated well Electronic Signature(s) Signed: 01/14/2021 4:40:41 PM By: Donnamarie Poag Entered By: Donnamarie Poag on 01/14/2021 15:50:17 [name] ([id number]) -------------------------------------------------------------------------------- Physical Exam Details Patient Name: [name] Date of Service: 01/14/2021 2:30 PM Medical Record Number: [id number] Patient Account Number: [id number] Date of Birth/Sex: 1950/10/09 (70 y.o. F) Treating RN: Donnamarie Poag Primary Care Provider: [name], Olivia Mackie Other Clinician: Referring Provider: [name], Olivia Mackie Treating Provider/Extender: Skipper Cliche in Treatment: 88 Constitutional Well-nourished and well-hydrated in no acute distress. Respiratory normal breathing without difficulty. Psychiatric this patient is able to make decisions and demonstrates good insight into disease process. Alert and Oriented x 3. pleasant and cooperative. Notes Upon inspection patient's wound bed actually showed signs of good granulation epithelization at this point. Fortunately there does not appear to be any signs of active infection systemically which is great news and overall very pleased. I did perform debridement to clear away some of the necrotic bone on the surface of the wound of the toe I think this will help it to heal as well there was a fairly solid surface just under the outer surface and again this may just be necrotic bone due to exposure to air and breakdown. Obviously the MRI did not show any signs of obvious osteo-. Electronic Signature(s) Signed: 01/14/2021 5:22:43 PM  By: Worthy Keeler PA-C Entered By: Worthy Keeler on 01/14/2021 17:22:43 AAYAT, HAJJAR  ([id number]) -------------------------------------------------------------------------------- Physician Orders Details Patient Name: [name] Date of Service: 01/14/2021 2:30 PM Medical Record Number: [id number] Patient Account Number: [id number] Date of Birth/Sex: 1950/05/30 (70 y.o. F) Treating RN: Donnamarie Poag Primary Care Provider: [name], Olivia Mackie Other Clinician: Referring Provider: [name], Olivia Mackie Treating Provider/Extender: Skipper Cliche in Treatment: 5 Verbal / Phone Orders: No Diagnosis Coding ICD-10 Coding Code Description E11.621 Type 2 diabetes mellitus with foot ulcer I87.2 Venous insufficiency (chronic) (peripheral) Z79.01 Long term (current) use of anticoagulants L97.812 Non-pressure chronic ulcer of other part of right lower leg with fat layer exposed L97.522 Non-pressure chronic ulcer of other part of left foot with fat layer exposed Follow-up Appointments o Return Appointment in 2 weeks. o Nurse Visit as needed [place] [address] [place]: - [place] for wound care. May utilize formulary equivalent dressing for wound treatment orders unless otherwise specified. Home Health Nurse may visit PRN to address patientos wound care needs. o Scheduled days for dressing changes to be completed; exception, patient has scheduled wound care visit that day. o **Please direct any NON-WOUND related issues/requests for orders to patient's Primary Care Physician. **If current dressing causes regression in wound condition, may D/C ordered dressing product/s and apply Normal Saline Moist Dressing daily until next [place] or Other MD appointment. **Notify [facility] of regression in wound condition at [phone]. Bathing/ Shower/ Hygiene o May shower with wound dressing protected with water repellent cover or cast protector. o No tub bath. Edema Control  - Lymphedema / Segmental Compressive Device / Other o Patient to wear own compression stockings. Remove compression stockings every night before going to bed and put on every morning when getting up. - WHEN NOT USING A COMPRESSION WRAP, USE COMPRESSION HOSE o Elevate, Exercise Daily and Avoid Standing for Long Periods of Time. o Elevate legs to the level of the heart and pump ankles as often as possible o Elevate leg(s) parallel to the floor when sitting. o DO YOUR BEST to sleep in the bed at night. DO NOT sleep in your recliner. Long hours of sitting in a recliner leads to swelling of the legs and/or potential wounds on your backside. Off-Loading Wound #16 Left,Distal Toe Fourth o Open toe surgical shoe - keep pressure off wound/toes o Other: - suggest float heels when in bed with a pillow to keep pressure off of heels; choose shoewear for right foot to not rub foot Additional Orders / Instructions o Follow Nutritious Diet and Increase Protein Intake Medications-Please add to medication list. o P.O. Antibiotics - Pick up and start antibiotics at pharmacy of choice Wound Treatment Wound #13 - Lower Leg Wound Laterality: Right, Midline Cleanser: Soap and Water 3 x Per Week/30 Days Discharge Instructions: Gently cleanse wound with antibacterial soap, rinse and pat dry prior to dressing wounds Cleanser: Wound Cleanser 3 x Per Week/30 Days [name], Dawn B. ([id number]) Discharge Instructions: Wash your hands with soap and water. Remove old dressing, discard into plastic bag and place into trash. Cleanse the wound with Wound Cleanser prior to applying a clean dressing using gauze sponges, not tissues or cotton balls. Do not scrub or use excessive force. Pat dry using gauze sponges, not tissue or cotton balls. Primary Dressing: Hydrofera Blue Ready Transfer Foam, 2.5x2.5 (in/in) (Home Health) 3 x Per Week/30 Days Discharge Instructions: Apply Hydrofera Blue Ready  to wound bed as  directed Secondary Dressing: ABD Pad 5x9 (in/in) (Home Health) 3 x Per Week/30 Days Discharge Instructions: Cover with ABD pad Compression Wrap: Profore Lite LF 3 Multilayer Compression Bandaging System (Home Health) 3 x Per Week/30 Days Discharge Instructions: Apply 3 multi-layer wrap as prescribed. Wound #16 - Toe Fourth Wound Laterality: Left, Distal Cleanser: Soap and Water Every Other Day/30 Days Discharge Instructions: Gently cleanse wound with antibacterial soap, rinse and pat dry prior to dressing wounds Cleanser: Wound Cleanser Every Other Day/30 Days Discharge Instructions: Wash your hands with soap and water. Remove old dressing, discard into plastic bag and place into trash. Cleanse the wound with Wound Cleanser prior to applying a clean dressing using gauze sponges, not tissues or cotton balls. Do not scrub or use excessive force. Pat dry using gauze sponges, not tissue or cotton balls. Primary Dressing: Hydrofera Blue Ready Transfer Foam, 2.5x2.5 (in/in) (Home Health) Every Other Day/30 Days Discharge Instructions: Apply Hydrofera Blue Ready to wound bed as directed Secondary Dressing: ABD Pad 5x9 (in/in) Every Other Day/30 Days Discharge Instructions: Cover with ABD pad or cut in half Secured With: 68M Medipore H Soft Cloth Surgical Tape, 2x2 (in/yd) Every Other Day/30 Days Secured With: [employer] Sterile or Non-Sterile 6-ply 4.5x4 (yd/yd) Every Other Day/30 Days Discharge Instructions: Apply Kerlix as directed Wound #17 - Lower Leg Wound Laterality: Left Cleanser: Normal Saline 3 x Per Week/30 Days Discharge Instructions: Wash your hands with soap and water. Remove old dressing, discard into plastic bag and place into trash. Cleanse the wound with Normal Saline prior to applying a clean dressing using gauze sponges, not tissues or cotton balls. Do not scrub or use excessive force. Pat dry using gauze sponges, not tissue or cotton balls. Primary Dressing: Hydrofera Blue Ready  Transfer Foam, 2.5x2.5 (in/in) 3 x Per Week/30 Days Discharge Instructions: Apply Hydrofera Blue Ready to wound bed as directed Secondary Dressing: Coverlet Latex-Free Fabric Adhesive Dressings 3 x Per Week/30 Days Discharge Instructions: 1.5 x 2 Laboratory o Tissue Pathology biopsy report (PATH) oooo LOINC Code: 18563-1 oooo Convenience Name: [facility] Path Bx report Electronic Signature(s) Signed: 01/14/2021 4:40:41 PM By: Donnamarie Poag Signed: 01/15/2021 4:54:49 PM By: Worthy Keeler PA-C Entered By: Donnamarie Poag on 01/14/2021 15:44:14 Demonbreun, [name] ([id number]) -------------------------------------------------------------------------------- Problem List Details Patient Name: [name] Date of Service: 01/14/2021 2:30 PM Medical Record Number: [id number] Patient Account Number: [id number] Date of Birth/Sex: 12-25-1950 (70 y.o. F) Treating RN: Donnamarie Poag Primary Care Provider: [name], Olivia Mackie Other Clinician: Referring Provider: [name], Olivia Mackie Treating Provider/Extender: Skipper Cliche in Treatment: 28 Active Problems ICD-10 Encounter Code Description Active Date MDM Diagnosis E11.621 Type 2 diabetes mellitus with foot ulcer 06/28/2020 No Yes I87.2 Venous insufficiency (chronic) (peripheral) 06/28/2020 No Yes Z79.01 Long term (current) use of anticoagulants 06/28/2020 No Yes L97.812 Non-pressure chronic ulcer of other part of right lower leg with fat layer 06/28/2020 No Yes exposed L97.522 Non-pressure chronic ulcer of other part of left foot with fat layer 06/28/2020 No Yes exposed Inactive Problems ICD-10 Code Description Active Date Inactive Date I10 Essential (primary) hypertension 06/28/2020 06/28/2020 Z86.718 Personal history of other venous thrombosis and embolism 06/28/2020 06/28/2020 Resolved Problems ICD-10 Code Description Active Date Resolved Date L97.322 Non-pressure chronic ulcer of left ankle with fat layer exposed 06/28/2020 06/28/2020 S50.811A Abrasion  of right forearm, initial encounter 10/30/2020 10/30/2020 S50.311D Abrasion of right elbow, subsequent encounter 08/16/2020 08/16/2020 Electronic Signature(s) DEMITRA, DANLEY ([id number]) Signed: 01/14/2021 2:53:03 PM By: Worthy Keeler PA-C  Entered By: Worthy Keeler on 01/14/2021 14:53:02 Sabella, [name] ([id number]) -------------------------------------------------------------------------------- Progress Note Details Patient Name: [name], [name] Date of Service: 01/14/2021 2:30 PM Medical Record Number: [id number] Patient Account Number: [id number] Date of Birth/Sex: 1951-03-01 (70 y.o. F) Treating RN: Donnamarie Poag Primary Care Provider: [name], Olivia Mackie Other Clinician: Referring Provider: [name], Olivia Mackie Treating Provider/Extender: Skipper Cliche in Treatment: 28 Subjective Chief Complaint Information obtained from Patient Right Leg Ulcer and Right 4thToe Ulcer History of Present Illness (HPI) 06/28/20 upon evaluation today patient appears to be doing unfortunately somewhat poorly in regard to wounds that she has over her lower extremities. She has a wound on the left distal second toe, left posterior heel, and right anterior lower leg. With that being said all in all none of the wounds appear to be too bad the one that hurts her the most is actually the wound on the posterior heel which actually is also one of the smallest. Nonetheless I think there is some callus hiding what is really going on in this area. Fortunately there does not appear to be any obvious evidence of infection which is great news. Patient does have a history of diabetes mellitus type 2, chronic venous insufficiency, hypertension, history of DVTs, and is on long-term anticoagulant therapy, Eliquis. 07/05/20 upon evaluation today patient appears to be doing well currently with regard to her wounds. I feel like she is making progress which is great news and overall there is no signs of active  infection at this time. No fevers, chills, nausea, vomiting, or diarrhea. 4/29; patient has a only a small area remaining on the tip of her left second toe and a small area remaining on the left heel. The area on the right anterior lower leg is healed. They tell me that she had remote podiatric surgery on the toes of the left foot however they are very fixed in position and I wonder whether they are making it difficult to relieve pressure in her foot wear. She is a type II diabetic with neuropathy as well. 08/01/2020 upon evaluation today patient appears to be doing well currently in regard to her wounds. In fact everything appears to be doing much better. The posterior aspect of her heel is actually giving her some trouble not the Achilles area but a small wound that we did not even have marked. In fact I had noted this before. Nonetheless we are going to addressed that today. 5/27; most of the patient's wounds are healed including the left Achilles heel left toe. Right forearm is also healed. She has a new abrasion posteriorly in the right elbow area just above the olecranon this happened today with an abrasion from her car door 6/15; the patient's wounds on the left Achilles and left second toe remain healed. The area on the right forearm is also just about healed in fact the skin I replaced last time is looking viable which is good. She had me look at the left heel again which is not in the area of the wound more towards the tip of the heel at the Achilles insertion site as well as the tip of the left foot second toe. In the Achilles area that is clearly is friction probably in bed at night. Not really near where the wound was. The left second toe remains healed although this is hammered and overhanging first toe also puts pressure in this area 09/13/2020 upon evaluation today patient appears to be doing well with regard to all previous  wounds that she had. Everything is completely healed. With that  being said I do think that the patient unfortunately has a new skin tear on the right forearm which is due to her put a Band-Aid on it and then when it pulled off this caused some issues with skin tearing. Nonetheless I think that this needs to be addressed today. Hopefully will take too long to get this to heal. She is having some issues with pain in her heel but I think this is more related to be honest to her neuropathy as opposed to anything else. Readmission: 10/30/2020 upon evaluation today patient appears to be doing poorly in regard to her legs bilaterally. She is also having an issue with her fourth toe on the left. Fortunately there does not appear to be any signs of active infection at this time. No fevers, chills, nausea, vomiting, or diarrhea. It is actually been quite a while since we have seen her. In fact June 24 was the last time that she was here in the clinic. She tells me she did not come back because things were just doing "pretty well". 11/29/2020 upon evaluation today patient actually appears to be doing well with regard to her arms those are completely healed. Her right leg is still open but doing okay. The fourth toe of the left foot does show some signs here of having some issues with necrotic tissue of an open wound. This is where a toenail was removed by podiatry. Subsequently I do think that we need to go ahead and see what we can do about getting this cleared away so being try to get some healing going forward. 12/13/2020 upon evaluation today patient appears to be doing decently well in regard to her wound on the right leg. In general I think that she is making good progress here. Unfortunately she continues to have significant issues with swelling in the left leg is now even more swollen at this point. For that reason I do believe she would be benefited by wrapping both sides although she is not interested in doing this. Overall I think that she is definitely making some  good progress here nonetheless in regard to the right leg left leg leave some to be desired. 12/27/2020 upon evaluation today patient appears to be doing well with regard to her wound in regard to the legs. Unfortunately the toe was not doing nearly as well. I am much more concerned about infection and osteomyelitis here. She had the MRI but right now were not seeing obvious evidence of redness spreading up to her foot this is good news I do not have the result of the MRI as of yet. I am good to place her on doxycycline however due to the fact that the wound does seem to be getting a little worse in regard to her toe. 01/14/2021 upon evaluation today patient appears to be doing well with regard to her ulcers. The right leg is doing well and even the toe seems to be doing well though she still has some evidence of bone exposed on the toe which does not appear to be doing quite as good as I would like to see just and the fact that is still not covered over new tissue but looks tremendously better compared to 2 weeks ago. Overall very pleased in that regard. [name], Dawn B. ([id number]) Objective Constitutional Well-nourished and well-hydrated in no acute distress. Vitals Time Taken: 2:55 PM, Height: 65.5 in, Weight: 240 lbs, BMI: 39.3,  Temperature: 97.8 F, Pulse: 58 bpm, Respiratory Rate: 18 breaths/min, Blood Pressure: 112/68 mmHg. Respiratory normal breathing without difficulty. Psychiatric this patient is able to make decisions and demonstrates good insight into disease process. Alert and Oriented x 3. pleasant and cooperative. General Notes: Upon inspection patient's wound bed actually showed signs of good granulation epithelization at this point. Fortunately there does not appear to be any signs of active infection systemically which is great news and overall very pleased. I did perform debridement to clear away some of the necrotic bone on the surface of the wound of the toe I think this  will help it to heal as well there was a fairly solid surface just under the outer surface and again this may just be necrotic bone due to exposure to air and breakdown. Obviously the MRI did not show any signs of obvious osteo-. Integumentary (Hair, Skin) Wound #13 status is Open. Original cause of wound was Gradually Appeared. The date acquired was: 10/21/2020. The wound has been in treatment 10 weeks. The wound is located on the Right,Midline Lower Leg. The wound measures 0.5cm length x 0.3cm width x 0.1cm depth; 0.118cm^2 area and 0.012cm^3 volume. There is Fat Layer (Subcutaneous Tissue) exposed. There is no tunneling or undermining noted. There is a medium amount of serosanguineous drainage noted. The wound margin is flat and intact. There is large (67-100%) red, pink granulation within the wound bed. There is a small (1-33%) amount of necrotic tissue within the wound bed including Adherent Slough. Wound #16 status is Open. Original cause of wound was Pressure Injury. The date acquired was: 10/31/2020. The wound has been in treatment 6 weeks. The wound is located on the Left,Distal Toe Fourth. The wound measures 0.4cm length x 0.5cm width x 0.2cm depth; 0.157cm^2 area and 0.031cm^3 volume. There is bone and Fat Layer (Subcutaneous Tissue) exposed. There is no tunneling or undermining noted. There is a medium amount of serosanguineous drainage noted. There is medium (34-66%) red, pink granulation within the wound bed. There is a medium (34-66%) amount of necrotic tissue within the wound bed. Wound #17 status is Open. Original cause of wound was Blister. The date acquired was: 12/13/2020. The wound has been in treatment 4 weeks. The wound is located on the Left Lower Leg. The wound measures 0.1cm length x 0.1cm width x 0.1cm depth; 0.008cm^2 area and 0.001cm^3 volume. There is Fat Layer (Subcutaneous Tissue) exposed. There is no tunneling or undermining noted. There is a medium amount  of serosanguineous drainage noted. There is large (67-100%) red, pink granulation within the wound bed. There is a small (1-33%) amount of necrotic tissue within the wound bed including Adherent Slough. Other Condition(s) Patient presents with Other Dermatologic Condition located on the Left Foot. 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 There was a Excisional Skin/Subcutaneous Tissue/Muscle/Bone Debridement with a total area of 0.2 sq cm performed by Tommie Sams., PA-C. With the following instrument(s): Curette to remove Viable and Non-Viable tissue/material. [name], [name]. ([id number]) Material removed includes Bone,Subcutaneous Tissue, and Slough after achieving pain control using Lidocaine.  1 specimen was taken by a Tissue Culture and sent to the lab per facility protocol. A time out was conducted at 15:36, prior to the start of the procedure. A Minimum amount of bleeding was controlled with Pressure. The procedure was tolerated well. Post Debridement Measurements: 0.4cm length x 0.5cm width x 0.2cm depth; 0.031cm^3 volume. Character of Wound/Ulcer Post Debridement is improved. Severity of Tissue Post Debridement is: Bone involvement without necrosis. Post procedure Diagnosis Wound #16: Same as Pre-Procedure Wound #13 Pre-procedure diagnosis of Wound #13 is a Venous Leg Ulcer located on the Right,Midline Lower Leg . There was a Three Layer Compression Therapy Procedure by Donnamarie Poag,  RN. Post procedure Diagnosis Wound #13: Same as Pre-Procedure A Callus Pairing procedure was performed. by Tommie Sams., PA-C. Post procedure Diagnosis Wound #: Same as Pre-Procedure Notes: tolerated well Plan Follow-up Appointments: Return Appointment in 2 weeks. Nurse Visit as needed Home Health: Wound #13 Right,Midline Lower Leg: [place]: - [place] for wound care. May utilize formulary equivalent dressing for wound treatment orders unless otherwise specified. Home Health Nurse may visit PRN to address patient s wound care needs. Scheduled days for dressing changes to be completed; exception, patient has scheduled wound care visit that day. **Please direct any NON-WOUND related issues/requests for orders to patient's Primary Care Physician. **If current dressing causes regression in wound condition, may D/C ordered dressing product/s and apply Normal Saline Moist Dressing daily until next [facility] or Other MD appointment. **Notify [facility] of regression in wound condition at [phone]. Bathing/ Shower/ Hygiene: May shower with wound dressing protected with water repellent cover or cast protector. No tub bath. Edema Control - Lymphedema / Segmental Compressive Device / Other: Patient to wear own compression stockings. Remove compression stockings every night before going to bed and put on every morning when getting up. - WHEN NOT USING A COMPRESSION WRAP, USE COMPRESSION HOSE Elevate, Exercise Daily and Avoid Standing for Long Periods of Time. Elevate legs to the level of the heart and pump ankles as often as possible Elevate leg(s) parallel to the floor when sitting. DO YOUR BEST to sleep in the bed at night. DO NOT sleep in your recliner. Long hours of sitting in a recliner leads to swelling of the legs and/or potential wounds on your backside. Off-Loading: Wound #16 Left,Distal Toe Fourth: Open toe surgical shoe -  keep pressure off wound/toes Other: - suggest float heels when in bed with a pillow to keep pressure off of heels; choose shoewear for right foot to not rub foot Additional Orders / Instructions: Follow Nutritious Diet and Increase Protein Intake Medications-Please add to medication list.: P.O. Antibiotics - Pick up and start antibiotics at pharmacy of choice Laboratory ordered were: [facility] Bx report WOUND #13: - Lower Leg Wound Laterality: Right, Midline Cleanser: Soap and Water 3 x Per Week/30 Days Discharge Instructions: Gently cleanse wound with antibacterial soap, rinse and pat dry prior to dressing wounds Cleanser: Wound Cleanser 3 x Per Week/30 Days Discharge Instructions: Wash your hands with soap and water. Remove old dressing, discard into plastic bag and place into trash. Cleanse the wound with Wound Cleanser prior to applying a clean dressing using gauze sponges, not tissues or cotton balls. Do not scrub or use excessive force. Pat dry using gauze sponges, not tissue or cotton balls. Primary Dressing: Hydrofera Blue Ready Transfer Foam, 2.5x2.5 (in/in) (Home Health) 3 x Per Week/30 Days Discharge Instructions: Apply Hydrofera Blue Ready to  wound bed as directed Secondary Dressing: ABD Pad 5x9 (in/in) (Home Health) 3 x Per Week/30 Days Discharge Instructions: Cover with ABD pad Compression Wrap: Profore Lite LF 3 Multilayer Compression Bandaging System (Home Health) 3 x Per Week/30 Days Discharge Instructions: Apply 3 multi-layer wrap as prescribed. WOUND #16: - Toe Fourth Wound Laterality: Left, Distal Cleanser: Soap and Water Every Other Day/30 Days Discharge Instructions: Gently cleanse wound with antibacterial soap, rinse and pat dry prior to dressing wounds Cleanser: Wound Cleanser Every Other Day/30 Days Discharge Instructions: Wash your hands with soap and water. Remove old dressing, discard into plastic bag and place into trash. Cleanse the wound with Wound Cleanser  prior to applying a clean dressing using gauze sponges, not tissues or cotton balls. Do not scrub or use excessive force. Pat dry using gauze sponges, not tissue or cotton balls. Primary Dressing: Hydrofera Blue Ready Transfer Foam, 2.5x2.5 (in/in) [facility]) Every Other Day/30 Days [name], [name] ([id number]) Discharge Instructions: Apply Hydrofera Blue Ready to wound bed as directed Secondary Dressing: ABD Pad 5x9 (in/in) Every Other Day/30 Days Discharge Instructions: Cover with ABD pad or cut in half Secured With: 82M Medipore H Soft Cloth Surgical Tape, 2x2 (in/yd) Every Other Day/30 Days Secured With: [employer] Sterile or Non-Sterile 6-ply 4.5x4 (yd/yd) Every Other Day/30 Days Discharge Instructions: Apply Kerlix as directed WOUND #17: - Lower Leg Wound Laterality: Left Cleanser: Normal Saline 3 x Per Week/30 Days Discharge Instructions: Wash your hands with soap and water. Remove old dressing, discard into plastic bag and place into trash. Cleanse the wound with Normal Saline prior to applying a clean dressing using gauze sponges, not tissues or cotton balls. Do not scrub or use excessive force. Pat dry using gauze sponges, not tissue or cotton balls. Primary Dressing: Hydrofera Blue Ready Transfer Foam, 2.5x2.5 (in/in) 3 x Per Week/30 Days Discharge Instructions: Apply Hydrofera Blue Ready to wound bed as directed Secondary Dressing: Coverlet Latex-Free Fabric Adhesive Dressings 3 x Per Week/30 Days Discharge Instructions: 1.5 x 2 1. Would recommend currently that we going continue with the wound care measures as before and the patient is in agreement the plan reason the [facility] which seems to be doing well. 2. Also can recommend that we have the patient continue with the ABD pad to cover on the leg and were also doing a compression wrap on the right leg which is doing a great job for her. 3. I would also suggest we continue with the [facility] and a Band-Aid to  secure over the toe. We will see patient back for reevaluation in 1 week here in the clinic. If anything worsens or changes patient will contact our office for additional recommendations. Electronic Signature(s) Signed: 01/14/2021 5:23:22 PM By: Worthy Keeler PA-C Entered By: Worthy Keeler on 01/14/2021 17:23:21 [name], [name] ([id number]) -------------------------------------------------------------------------------- SuperBill Details Patient Name: [name] Date of Service: 01/14/2021 Medical Record Number: [id number] Patient Account Number: [id number] Date of Birth/Sex: November 21, 1950 (70 y.o. F) Treating RN: Donnamarie Poag Primary Care Provider: [name], Olivia Mackie Other Clinician: Referring Provider: [name], Olivia Mackie Treating Provider/Extender: Skipper Cliche in Treatment: 28 Diagnosis Coding ICD-10 Codes Code Description E11.621 Type 2 diabetes mellitus with foot ulcer I87.2 Venous insufficiency (chronic) (peripheral) Z79.01 Long term (current) use of anticoagulants L97.812 Non-pressure chronic ulcer of other part of right lower leg with fat layer exposed L97.522 Non-pressure chronic ulcer of other part of left foot with fat layer exposed Facility Procedures The patient participates with Medicare or  their insurance follows the Medicare Facility Guidelines: CPT4 Code Description Modifier Quantity [id number] 11044 - DEB BONE 20 SQ CM/< 1 The patient participates with Medicare or their insurance follows the Medicare Facility Guidelines: ICD-10 Diagnosis Description L97.522 Non-pressure chronic ulcer of other part of left foot with fat layer exposed Physician Procedures CPT4: Description Modifier Quantity Code [id number] - WC PHYS LEVEL 4 - EST PT 25 1 CPT4: ICD-10 Diagnosis Description E11.621 Type 2 diabetes mellitus with foot ulcer I87.2 Venous insufficiency (chronic) (peripheral) Z79.01 Long term (current) use of anticoagulants L97.812 Non-pressure chronic  ulcer of other part of right lower leg  with fat layer exposed CPT4: [id number] Debridement; bone (includes epidermis, dermis, subQ tissue, muscle and/or fascia, if performed) 1st 20 1 sqcm or less CPT4: ICD-10 Diagnosis Description L97.522 Non-pressure chronic ulcer of other part of left foot with fat layer exposed Electronic Signature(s) Signed: 01/14/2021 5:23:48 PM By: Worthy Keeler PA-C Previous Signature: 01/14/2021 4:40:41 PM Version By: Donnamarie Poag Entered By: Worthy Keeler on 01/14/2021 17:23:47

## 2021-01-14 NOTE — Progress Notes (Addendum)
Dawn, [name] ([id number]) Visit Report for 01/14/2021 Arrival Information Details Patient Name: [name], [name] Date of Service: 01/14/2021 2:30 PM Medical Record Number: [id number] Patient Account Number: [id number] Date of Birth/Sex: 06-12-1950 (70 y.o. F) Treating RN: [name] Primary Care Janyra Barillas: [name] Other Clinician: Referring Coner Gibbard: [name] Treating [name]: [name] in Treatment: 28 Visit Information History Since Last Visit Added or deleted any medications: No Patient Arrived: Wheel Chair Had a fall or experienced change in No Arrival Time: 14:52 activities of daily living that may affect Accompanied By: husband risk of falls: Transfer Assistance: EasyPivot Patient Lift Hospitalized since last visit: No Patient Identification Verified: Yes Has Dressing in Place as Prescribed: Yes Secondary Verification Process Completed: Yes Has Compression in Place as Prescribed: Yes Patient Has Alerts: Yes Pain Present Now: Yes Patient Alerts: Patient on Blood Thinner Eliquis Diabetic II 06/2020 ABI L)1.22 R)1.14 Electronic Signature(s) Signed: 01/14/2021 4:40:41 PM By: [name] Entered By: [name] on 01/14/2021 14:55:36 [name] ([id number]) -------------------------------------------------------------------------------- Clinic Level of Care Assessment Details Patient Name: [name] Date of Service: 01/14/2021 2:30 PM Medical Record Number: [id number] Patient Account Number: [id number] Date of Birth/Sex: August 23, 1950 (70 y.o. F) Treating RN: [name] Primary Care Williette Loewe: [name] Other Clinician: Referring Brenten Janney: [name] Treating Rosanne Wohlfarth/Extender: [name] in Treatment: 28 Clinic Level of Care Assessment Items TOOL 1 Quantity Score _0  - Use when EandM and Procedure is performed on INITIAL visit 0 ASSESSMENTS - Nursing Assessment /  Reassessment _1  - General Physical Exam (combine w/ comprehensive assessment (listed just below) when performed on new 0 pt. evals) _2  - 0 Comprehensive Assessment (HX, ROS, Risk Assessments, Wounds Hx, etc.) ASSESSMENTS - Wound and Skin Assessment / Reassessment _3  - Dermatologic / Skin Assessment (not related to wound area) 0 ASSESSMENTS - Ostomy and/or Continence Assessment and Care _4  - Incontinence Assessment and Management 0 _5  - 0 Ostomy Care Assessment and Management (repouching, etc.) PROCESS - Coordination of Care _6  - Simple Patient / Family Education for ongoing care 0 _7  - 0 Complex (extensive) Patient / Family Education for ongoing care _8  - 0 Staff obtains Programmer, systems, Records, Test Results / Process Orders _9  - 0 Staff telephones HHA, Nursing Homes / Clarify orders / etc _10  - 0 Routine Transfer to another Facility (non-emergent condition) _11  - 0 Routine Hospital Admission (non-emergent condition) _12  - 0 New Admissions / Biomedical engineer / Ordering NPWT, Apligraf, etc. _13  - 0 Emergency Hospital Admission (emergent condition) PROCESS - Special Needs _14  - Pediatric / Minor Patient Management 0 _15  - 0 Isolation Patient Management _16  - 0 Hearing / Language / Visual special needs _17  - 0 Assessment of Community assistance (transportation, D/C planning, etc.) _18  - 0 Additional assistance / Altered mentation _19  - 0 Support Surface(s) Assessment (bed, cushion, seat, etc.) INTERVENTIONS - Miscellaneous _20  - External ear exam 0 _21  - 0 Patient Transfer (multiple staff / Civil Service fast streamer / Similar devices) _22  - 0 Simple Staple / Suture removal (25 or less) _23  - 0 Complex Staple / Suture removal (26 or more) _24  - 0 Hypo/Hyperglycemic Management (do not check if billed separately) _25  - 0 Ankle / Brachial Index (ABI) - do not check if billed separately Has the patient been seen at the hospital within the last three years: Yes Total Score: 0 Level Of Care:  ____ [name] ([id number]) Electronic Signature(s) Signed: 01/14/2021 4:40:41 PM By: [name] Entered By: [name] on 01/14/2021 15:44:22 [name] (  [id number]) -------------------------------------------------------------------------------- Compression Therapy Details Patient Name: [name] Date of Service: 01/14/2021 2:30 PM Medical Record Number: [id number] Patient Account Number: [id number] Date of Birth/Sex: 20-Dec-1950 (70 y.o. F) Treating RN: [name] Primary Care Yania Bogie: [name] Other Clinician: Referring Dusty Wagoner: [name] Treating Darrly Loberg/Extender: [name] in Treatment: 28 Compression Therapy Performed for Wound Assessment: Wound #13 Right,Midline Lower Leg Performed By: Dawn Argyle, RN Compression Type: Three Layer Post Procedure Diagnosis Same as Pre-procedure Electronic Signature(s) Signed: 01/14/2021 4:40:41 PM By: [name] Entered By: [name] on 01/14/2021 15:35:44 Santoli, [name] ([id number]) -------------------------------------------------------------------------------- Encounter Discharge Information Details Patient Name: [name] Date of Service: 01/14/2021 2:30 PM Medical Record Number: [id number] Patient Account Number: [id number] Date of Birth/Sex: [birth year] (70 y.o. F) Treating RN: [name] Primary Care Karesha Trzcinski: [name] Other Clinician: Referring Cael Worth: [name] Treating Granite Godman/Extender: [name] in Treatment: 28 Encounter Discharge Information Items Post Procedure Vitals Discharge Condition: Stable Temperature (F): 97.8 Ambulatory Status: Wheelchair Pulse (bpm): 58 Discharge Destination: Home Respiratory Rate (breaths/min): 18 Transportation: Private Auto Blood Pressure (mmHg): 112/68 Accompanied By: husband Schedule Follow-up Appointment: Yes Clinical Summary of Care: Electronic  Signature(s) Signed: 01/14/2021 4:40:41 PM By: [name] Entered By: [name] on 01/14/2021 16:03:35 Sease, [name] ([id number]) -------------------------------------------------------------------------------- Lower Extremity Assessment Details Patient Name: [name] Date of Service: 01/14/2021 2:30 PM Medical Record Number: [id number] Patient Account Number: [id number] Date of Birth/Sex: March 15, 1951 (70 y.o. F) Treating RN: [name] Primary Care Joscelin Fray: [name] Other Clinician: Referring Anjela Cassara: [name] Treating Cory Rama/Extender: [name] in Treatment: 28 Edema Assessment Assessed: [Left: Yes] [Right: Yes] Edema: [Left: Yes] [Right: Yes] Calf Left: Right: Point of Measurement: 31 cm From Medial Instep 34.5 cm 35 cm Ankle Left: Right: Point of Measurement: 11 cm From Medial Instep 17 cm 17.3 cm Knee To Floor Left: Right: From Medial Instep 40 cm 40 cm Vascular Assessment Pulses: Dorsalis Pedis Palpable: [Left:Yes] [Right:Yes] Electronic Signature(s) Signed: 01/14/2021 4:40:41 PM By: [name] Entered By: [name] on 01/14/2021 15:08:32 Trapani, [name] ([id number]) -------------------------------------------------------------------------------- Multi Wound Chart Details Patient Name: [name] Date of Service: 01/14/2021 2:30 PM Medical Record Number: [id number] Patient Account Number: [id number] Date of Birth/Sex: [birth year] (70 y.o. F) Treating RN: [name] Primary Care Eugenie Harewood: [name] Other Clinician: Referring Denvil Canning: [name] Treating Mionna Advincula/Extender: [name] in Treatment: 28 Vital Signs Height(in): 65.5 Pulse(bpm): 25 Weight(lbs): 240 Blood Pressure(mmHg): 112/68 Body Mass Index(BMI): 39 Temperature(F): 97.8 Respiratory Rate(breaths/min): 18 Photos: Wound Location: Right, Midline Lower Leg Left, Distal Toe Fourth Left Lower Leg Wounding  Event: Gradually Appeared Pressure Injury Blister Primary Etiology: Venous Leg Ulcer Diabetic Wound/Ulcer of the Lower Venous Leg Ulcer Extremity Comorbid History: Asthma, Pneumothorax, Asthma, Pneumothorax, Asthma, Pneumothorax, Arrhythmia, Hypertension, Type II Arrhythmia, Hypertension, Type II Arrhythmia, Hypertension, Type II Diabetes, Gout, Osteoarthritis, Diabetes, Gout, Osteoarthritis, Diabetes, Gout, Osteoarthritis, Neuropathy Neuropathy Neuropathy Date Acquired: 10/21/2020 10/31/2020 12/13/2020 Weeks of Treatment: _0 Wound Status: Open Open Open Measurements L x W x D (cm) 0.5x0.3x0.1 0.4x0.5x0.2 0.1x0.1x0.1 Area (cm) : 0.118 0.157 0.008 Volume (cm) : 0.012 0.031 0.001 % Reduction in Area: 97.50% 80.00% 97.10% % Reduction in Volume: 97.40% 60.80% 96.30% Classification: Full Thickness Without Exposed Grade 2 Full Thickness Without Exposed Support Structures Support Structures Exudate Amount: Medium Medium Medium Exudate Type: Serosanguineous Serosanguineous Serosanguineous Exudate Color: red, brown red, brown red, brown Wound Margin: Flat and Intact N/A N/A Granulation Amount: Large (67-100%) Medium (34-66%) Large (67-100%) Granulation Quality:  Red, Pink Red, Pink Red, Pink Necrotic Amount: Small (1-33%) Medium (34-66%) Small (1-33%) Exposed Structures: Fat Layer (Subcutaneous Tissue): Fat Layer (Subcutaneous Tissue): Fat Layer (Subcutaneous Tissue): Yes Yes Yes Fascia: No Bone: Yes Fascia: No Tendon: No Fascia: No Tendon: No Muscle: No Tendon: No Muscle: No Joint: No Muscle: No Joint: No Bone: No Joint: No Bone: No Epithelialization: None N/A N/A Treatment Notes Electronic Signature(s) Signed: 01/14/2021 4:40:41 PM By: [name] Entered By: [name] on 01/14/2021 15:11:19 [name] ([id number]) OHALLORAN, [name] ([id number]) -------------------------------------------------------------------------------- [facility]  Details Patient Name: [name] Date of Service: 01/14/2021 2:30 PM Medical Record Number: [id number] Patient Account Number: [id number] Date of Birth/Sex: 05/15/1950 (70 y.o. F) Treating RN: [name] Primary Care Vi Whitesel: [name] Other Clinician: Referring Eldine Rencher: [name] Treating Jumanah Hynson/Extender: [name] in Treatment: 28 Active Inactive Wound/Skin Impairment Nursing Diagnoses: Knowledge deficit related to ulceration/compromised skin integrity Goals: Patient/caregiver will verbalize understanding of skin care regimen Date Initiated: 06/28/2020 Date Inactivated: 08/16/2020 Target Resolution Date: 07/28/2020 Goal Status: Met Ulcer/skin breakdown will have a volume reduction of 30% by week 4 Date Initiated: 06/28/2020 Date Inactivated: 08/16/2020 Target Resolution Date: 07/28/2020 Goal Status: Met Ulcer/skin breakdown will have a volume reduction of 50% by week 8 Date Initiated: 06/28/2020 Date Inactivated: 09/13/2020 Target Resolution Date: 08/28/2020 Goal Status: Met Ulcer/skin breakdown will have a volume reduction of 80% by week 12 Date Initiated: 06/28/2020 Date Inactivated: 01/14/2021 Target Resolution Date: 09/27/2020 Unmet Reason: missed appt/non Goal Status: Unmet compliant Ulcer/skin breakdown will heal within 14 weeks Date Initiated: 06/28/2020 Target Resolution Date: 10/28/2020 Goal Status: Active Interventions: Assess patient/caregiver ability to obtain necessary supplies Assess patient/caregiver ability to perform ulcer/skin care regimen upon admission and as needed Assess ulceration(s) every visit Notes: Electronic Signature(s) Signed: 01/14/2021 4:40:41 PM By: [name] Entered By: [name] on 01/14/2021 15:09:16 Menge, [name] ([id number]) -------------------------------------------------------------------------------- Non-Wound Condition Assessment Details Patient Name: [name] Date of Service:  01/14/2021 2:30 PM Medical Record Number: [id number] Patient Account Number: [id number] Date of Birth/Sex: 11-17-1950 (70 y.o. F) Treating RN: [name] Primary Care Yailen Zemaitis: [name] Other Clinician: Referring Jayshawn Colston: [name] Treating Betul Brisky/Extender: [name] in Treatment: 28 Non-Wound Condition: Condition: Other Dermatologic Condition Location: Foot Side: Left Notes: callus area left heel Photos Electronic Signature(s) Signed: 01/14/2021 4:40:41 PM By: [name] Entered By: [name] on 01/14/2021 15:49:40 Chanda, [name] ([id number]) -------------------------------------------------------------------------------- Pain Assessment Details Patient Name: [name] Date of Service: 01/14/2021 2:30 PM Medical Record Number: [id number] Patient Account Number: [id number] Date of Birth/Sex: 09/04/1950 (70 y.o. F) Treating RN: [name] Primary Care Katie Faraone: [name] Other Clinician: Referring Kieanna Rollo: [name] Treating Toria Monte/Extender: [name] in Treatment: 28 Active Problems Location of Pain Severity and Description of Pain Patient Has Paino Yes Site Locations Rate the pain. Current Pain Level: 4 Pain Management and Medication Current Pain Management: Electronic Signature(s) Signed: 01/14/2021 4:40:41 PM By: [name] Entered By: [name] on 01/14/2021 14:59:18 Hern, [name] ([id number]) -------------------------------------------------------------------------------- Patient/Caregiver Education Details Patient Name: [name] Date of Service: 01/14/2021 2:30 PM Medical Record Number: [id number] Patient Account Number: [id number] Date of Birth/Gender: Mar 17, 1951 (70 y.o. F) Treating RN: [name] Primary Care Physician: [name] Other Clinician: Referring Physician: [name], Olivia [name] Treating Physician/Extender: [name] in  Treatment: 28 Education Assessment Education Provided To: Patient Education Topics Provided Basic Hygiene: Wound Debridement: Wound/Skin Impairment: Electronic Signature(s) Signed: 01/14/2021 4:40:41 PM By: [name] Entered By: [name]  on 01/14/2021 15:45:24 [name] ([id number]) -------------------------------------------------------------------------------- Wound Assessment Details Patient Name: [name] Date of Service: 01/14/2021 2:30 PM Medical Record Number: [id number] Patient Account Number: [id number] Date of Birth/Sex: 1951-01-08 (70 y.o. F) Treating RN: [name] Primary Care Damario Gillie: [name] Other Clinician: Referring Krystina Strieter: [name] Treating Keysi Oelkers/Extender: [name] in Treatment: 28 Wound Status Wound Number: 13 Primary Venous Leg Ulcer Etiology: Wound Location: Right, Midline Lower Leg Wound Open Wounding Event: Gradually Appeared Status: Date Acquired: 10/21/2020 Comorbid Asthma, Pneumothorax, Arrhythmia, Hypertension, Type Weeks Of Treatment: 10 History: II Diabetes, Gout, Osteoarthritis, Neuropathy Clustered Wound: No Photos Wound Measurements Length: (cm) 0.5 Width: (cm) 0.3 Depth: (cm) 0.1 Area: (cm) 0.118 Volume: (cm) 0.012 % Reduction in Area: 97.5% % Reduction in Volume: 97.4% Epithelialization: None Tunneling: No Undermining: No Wound Description Classification: Full Thickness Without Exposed Support Structu Wound Margin: Flat and Intact Exudate Amount: Medium Exudate Type: Serosanguineous Exudate Color: red, brown res Foul Odor After Cleansing: No Slough/Fibrino Yes Wound Bed Granulation Amount: Large (67-100%) Exposed Structure Granulation Quality: Red, Pink Fascia Exposed: No Necrotic Amount: Small (1-33%) Fat Layer (Subcutaneous Tissue) Exposed: Yes Necrotic Quality: Adherent Slough Tendon Exposed: No Muscle Exposed: No Joint Exposed: No Bone Exposed:  No Treatment Notes Wound #13 (Lower Leg) Wound Laterality: Right, Midline Cleanser Soap and Water Discharge Instruction: Gently cleanse wound with antibacterial soap, rinse and pat dry prior to dressing wounds Wound Cleanser [place], Ascencion B. ([id number]) Discharge Instruction: Wash your hands with soap and water. Remove old dressing, discard into plastic bag and place into trash. Cleanse the wound with Wound Cleanser prior to applying a clean dressing using gauze sponges, not tissues or cotton balls. Do not scrub or use excessive force. Pat dry using gauze sponges, not tissue or cotton balls. Peri-Wound Care Topical Primary Dressing Hydrofera Blue Ready Transfer Foam, 2.5x2.5 (in/in) Discharge Instruction: Apply Hydrofera Blue Ready to wound bed as directed Secondary Dressing ABD Pad 5x9 (in/in) Discharge Instruction: Cover with ABD pad Secured With Compression Wrap Profore Lite LF 3 Multilayer Compression [place] Discharge Instruction: Apply 3 multi-layer wrap as prescribed. Compression Stockings Add-Ons Electronic Signature(s) Signed: 01/14/2021 4:40:41 PM By: [name] Entered By: [name] on 01/14/2021 15:03:40 [name] ([id number]) -------------------------------------------------------------------------------- Wound Assessment Details Patient Name: [name] Date of Service: 01/14/2021 2:30 PM Medical Record Number: [id number] Patient Account Number: [id number] Date of Birth/Sex: May 09, 1950 (70 y.o. F) Treating RN: [name] Primary Care Arlyce Circle: [name] Other Clinician: Referring Aldonia Keeven: [name] Treating Chrisie Jankovich/Extender: [name] in Treatment: 28 Wound Status Wound Number: 16 Primary Diabetic Wound/Ulcer of the Lower Extremity Etiology: Wound Location: Left, Distal Toe Fourth Wound Open Wounding Event: Pressure Injury Status: Date Acquired: 10/31/2020 Notes: stated toenail was removed by  podiatry doctor on Weeks Of Treatment: 6 11/08/20 Clustered Wound: No Comorbid Asthma, Pneumothorax, Arrhythmia, Hypertension, Type History: II Diabetes, Gout, Osteoarthritis, Neuropathy Photos Wound Measurements Length: (cm) 0.4 Width: (cm) 0.5 Depth: (cm) 0.2 Area: (cm) 0.157 Volume: (cm) 0.031 % Reduction in Area: 80% % Reduction in Volume: 60.8% Tunneling: No Undermining: No Wound Description Classification: Grade 2 Exudate Amount: Medium Exudate Type: Serosanguineous Exudate Color: red, brown Foul Odor After Cleansing: No Slough/Fibrino Yes Wound Bed Granulation Amount: Medium (34-66%) Exposed Structure Granulation Quality: Red, Pink Fascia Exposed: No Necrotic Amount: Medium (34-66%) Fat Layer (Subcutaneous Tissue) Exposed: Yes Tendon Exposed: No Muscle Exposed: No Joint Exposed: No Bone Exposed: Yes Treatment Notes Wound #16 (Toe Fourth) Wound Laterality: Left, Distal Cleanser Soap and  Water Discharge Instruction: Gently cleanse wound with antibacterial soap, rinse and pat dry prior to dressing wounds Wound Cleanser [name], Dawn B. ([id number]) Discharge Instruction: Wash your hands with soap and water. Remove old dressing, discard into plastic bag and place into trash. Cleanse the wound with Wound Cleanser prior to applying a clean dressing using gauze sponges, not tissues or cotton balls. Do not scrub or use excessive force. Pat dry using gauze sponges, not tissue or cotton balls. Peri-Wound Care Topical Primary Dressing Hydrofera Blue Ready Transfer Foam, 2.5x2.5 (in/in) Discharge Instruction: Apply Hydrofera Blue Ready to wound bed as directed Secondary Dressing ABD Pad 5x9 (in/in) Discharge Instruction: Cover with ABD pad or cut in half Secured With Steger Surgical Tape, 2x2 (in/yd) Kerlix Roll Sterile or Non-Sterile 6-ply 4.5x4 (yd/yd) Discharge Instruction: Apply Kerlix as directed Compression Wrap Compression  Stockings Add-Ons Electronic Signature(s) Signed: 01/14/2021 4:40:41 PM By: [name] Entered By: [name] on 01/14/2021 15:07:19 [name] ([id number]) -------------------------------------------------------------------------------- Wound Assessment Details Patient Name: [name] Date of Service: 01/14/2021 2:30 PM Medical Record Number: [id number] Patient Account Number: [id number] Date of Birth/Sex: 12/22/1950 (70 y.o. F) Treating RN: [name] Primary Care Jaxston Chohan: [name] Other Clinician: Referring Akirah Storck: [name] Treating Idrissa Beville/Extender: [name] in Treatment: 28 Wound Status Wound Number: 17 Primary Venous Leg Ulcer Etiology: Wound Location: Left Lower Leg Wound Open Wounding Event: Blister Status: Date Acquired: 12/13/2020 Comorbid Asthma, Pneumothorax, Arrhythmia, Hypertension, Type Weeks Of Treatment: 4 History: II Diabetes, Gout, Osteoarthritis, Neuropathy Clustered Wound: No Photos Wound Measurements Length: (cm) 0.1 Width: (cm) 0.1 Depth: (cm) 0.1 Area: (cm) 0.008 Volume: (cm) 0.001 % Reduction in Area: 97.1% % Reduction in Volume: 96.3% Tunneling: No Undermining: No Wound Description Classification: Full Thickness Without Exposed Support Structures Exudate Amount: Medium Exudate Type: Serosanguineous Exudate Color: red, brown Foul Odor After Cleansing: No Slough/Fibrino Yes Wound Bed Granulation Amount: Large (67-100%) Exposed Structure Granulation Quality: Red, Pink Fascia Exposed: No Necrotic Amount: Small (1-33%) Fat Layer (Subcutaneous Tissue) Exposed: Yes Necrotic Quality: Adherent Slough Tendon Exposed: No Muscle Exposed: No Joint Exposed: No Bone Exposed: No Treatment Notes Wound #17 (Lower Leg) Wound Laterality: Left Cleanser Normal Saline Discharge Instruction: Wash your hands with soap and water. Remove old dressing, discard into plastic bag and place into  trash. Cleanse the wound with Normal Saline prior to applying a clean dressing using gauze sponges, not tissues or cotton balls. Do not scrub or use excessive force. Pat dry using gauze sponges, not tissue or cotton balls. [name] ([id number]) Peri-Wound Care Topical Primary Dressing Hydrofera Blue Ready Transfer Foam, 2.5x2.5 (in/in) Discharge Instruction: Apply Hydrofera Blue Ready to wound bed as directed Secondary Dressing Coverlet Latex-Free Fabric Adhesive Dressings Discharge Instruction: 1.5 x 2 Secured With Compression Wrap Compression Stockings Add-Ons Electronic Signature(s) Signed: 01/14/2021 4:40:41 PM By: [name] Entered By: [name] on 01/14/2021 15:06:29 [name] ([id number]) -------------------------------------------------------------------------------- Vitals Details Patient Name: [name] Date of Service: 01/14/2021 2:30 PM Medical Record Number: [id number] Patient Account Number: [id number] Date of Birth/Sex: December 08, 1950 (70 y.o. F) Treating RN: [name] Primary Care Odeal Welden: [name] Other Clinician: Referring Eufelia Veno: [name] Treating Gianelle Mccaul/Extender: [name] in Treatment: 28 Vital Signs Time Taken: 14:55 Temperature (F): 97.8 Height (in): 65.5 Pulse (bpm): 58 Weight (lbs): 240 Respiratory Rate (breaths/min): 18 Body Mass Index (BMI): 39.3 Blood Pressure (mmHg): 112/68 Reference Range: 80 - 120 mg / dl Electronic Signature(s) Signed: 01/14/2021 4:40:41 PM By: [name]  Entered ByDonnamarie [name] on 01/14/2021 14:59:11

## 2021-01-14 NOTE — Telephone Encounter (Signed)
50 mg Januvia is ok A1C 8.1  Also fax back forms thanks Toprol 25 mg xl daily Losartan as in chart is the dose if BP >130/>80 no hctz for now due to h/o elevated creatinine

## 2021-01-15 ENCOUNTER — Ambulatory Visit (INDEPENDENT_AMBULATORY_CARE_PROVIDER_SITE_OTHER): Payer: Medicare HMO | Admitting: Internal Medicine

## 2021-01-15 ENCOUNTER — Telehealth: Payer: Self-pay | Admitting: Internal Medicine

## 2021-01-15 ENCOUNTER — Encounter: Payer: Self-pay | Admitting: Internal Medicine

## 2021-01-15 VITALS — BP 118/68 | Ht 65.98 in | Wt 255.0 lb

## 2021-01-15 DIAGNOSIS — M898X2 Other specified disorders of bone, upper arm: Secondary | ICD-10-CM | POA: Diagnosis not present

## 2021-01-15 DIAGNOSIS — M19012 Primary osteoarthritis, left shoulder: Secondary | ICD-10-CM | POA: Diagnosis not present

## 2021-01-15 DIAGNOSIS — E039 Hypothyroidism, unspecified: Secondary | ICD-10-CM | POA: Diagnosis not present

## 2021-01-15 DIAGNOSIS — E1159 Type 2 diabetes mellitus with other circulatory complications: Secondary | ICD-10-CM | POA: Diagnosis not present

## 2021-01-15 DIAGNOSIS — I152 Hypertension secondary to endocrine disorders: Secondary | ICD-10-CM | POA: Diagnosis not present

## 2021-01-15 DIAGNOSIS — I10 Essential (primary) hypertension: Secondary | ICD-10-CM | POA: Diagnosis not present

## 2021-01-15 DIAGNOSIS — E611 Iron deficiency: Secondary | ICD-10-CM

## 2021-01-15 DIAGNOSIS — I639 Cerebral infarction, unspecified: Secondary | ICD-10-CM

## 2021-01-15 DIAGNOSIS — M25512 Pain in left shoulder: Secondary | ICD-10-CM | POA: Diagnosis not present

## 2021-01-15 DIAGNOSIS — G8929 Other chronic pain: Secondary | ICD-10-CM

## 2021-01-15 DIAGNOSIS — D649 Anemia, unspecified: Secondary | ICD-10-CM

## 2021-01-15 NOTE — Telephone Encounter (Signed)
Discussed during virtual visit 01/15/21

## 2021-01-15 NOTE — Telephone Encounter (Signed)
Lft pt vm on both numbers to call ofc to sch MRI. thanks

## 2021-01-15 NOTE — Telephone Encounter (Signed)
Please advise, okay for verbal?

## 2021-01-15 NOTE — Telephone Encounter (Signed)
Vicente Males from [facility] called the office stating that the patient wanted to move her therapy appointment discharge to next week. She will need a verbal order for this discharge change.

## 2021-01-15 NOTE — Telephone Encounter (Signed)
Hold for Patient appointment today 01/15/21

## 2021-01-16 LAB — SURGICAL PATHOLOGY

## 2021-01-16 NOTE — Telephone Encounter (Signed)
This is ok if pt desires this

## 2021-01-17 ENCOUNTER — Other Ambulatory Visit: Payer: Self-pay | Admitting: Family

## 2021-01-17 ENCOUNTER — Telehealth: Payer: Self-pay | Admitting: Internal Medicine

## 2021-01-17 DIAGNOSIS — I152 Hypertension secondary to endocrine disorders: Secondary | ICD-10-CM

## 2021-01-17 DIAGNOSIS — Z7901 Long term (current) use of anticoagulants: Secondary | ICD-10-CM

## 2021-01-17 DIAGNOSIS — I480 Paroxysmal atrial fibrillation: Secondary | ICD-10-CM

## 2021-01-17 MED ORDER — SITAGLIPTIN PHOSPHATE 50 MG PO TABS: 50.0000 mg | ORAL_TABLET | Freq: Every day | ORAL | 3 refills | Status: DC

## 2021-01-17 NOTE — Telephone Encounter (Signed)
Patient calling back in and states that the Januvia 50 mg was not received by [facility] pharmacy. States they sent her an RX of the 25 mg once daily 90 pills.   Informed the Patient that the 50 mg daily Januvia did not go through to the pharmacy. Apologized for this. Re-sent this to Patient's pharmacy and informed her that she could take two 25 mg pills daily until this ran out. Patient verbalized understanding   Patient asking that Dr Olivia Mackie [name] refill her Oxybutynin. States she will not be going back to [facility] and is currently waiting for her appointment with Uro-gynecology. State she is out of this medication and needs it send in before then.  Pended for your approval or denial.

## 2021-01-17 NOTE — Telephone Encounter (Signed)
[name] at [phone] and gave verbal for discharge date change

## 2021-01-17 NOTE — Telephone Encounter (Signed)
Pt last saw Laurann Montana, NP 08/20/20, last labs 10/15/20 Creat 1.19, age 70, weight 115.7kg, based on specified criteria pt is on appropriate dosage of Eliquis 5mg  BID for afib.  Will refill rx.

## 2021-01-20 ENCOUNTER — Encounter: Payer: Self-pay | Admitting: Obstetrics and Gynecology

## 2021-01-20 ENCOUNTER — Telehealth: Payer: Self-pay | Admitting: Internal Medicine

## 2021-01-20 ENCOUNTER — Other Ambulatory Visit: Payer: Self-pay | Admitting: Internal Medicine

## 2021-01-20 ENCOUNTER — Other Ambulatory Visit: Payer: Self-pay | Admitting: Obstetrics and Gynecology

## 2021-01-20 MED ORDER — LOSARTAN POTASSIUM 50 MG PO TABS: 50.0000 mg | ORAL_TABLET | Freq: Every day | ORAL | 3 refills | Status: DC

## 2021-01-20 MED ORDER — SITAGLIPTIN PHOSPHATE 50 MG PO TABS: 50.0000 mg | ORAL_TABLET | Freq: Every day | ORAL | 3 refills | Status: DC

## 2021-01-20 MED ORDER — OXYBUTYNIN CHLORIDE ER 15 MG PO TB24: 15.0000 mg | ORAL_TABLET | Freq: Every day | ORAL | 0 refills | Status: DC

## 2021-01-20 NOTE — Telephone Encounter (Signed)
Verbal ok?

## 2021-01-20 NOTE — Telephone Encounter (Signed)
I thought pharmacy already had the 50 mg dose sorry for this see prior note to call pharmacy re pt meds   Thank you

## 2021-01-20 NOTE — Telephone Encounter (Signed)
Per Lysbeth Galas from [place] called in about Pt pulse is outside the parameter. Per Lysbeth Galas from [facility] stated when Pt is not feeling well for them to contact Dr. Gabriel Carina. Advise Lysbeth Galas I will send them to access nurse. Transfer them to access nurse

## 2021-01-20 NOTE — Telephone Encounter (Signed)
Was she taking this and toviaz per urology? Not sure this should be the case will have Dr. Wannetta Sender reassess 02/25/21

## 2021-01-20 NOTE — Telephone Encounter (Signed)
Inform  She needs to call cardiology for appt may need zio heart monitor   If sx's recurrent rec go to ED before appt with cardiology

## 2021-01-20 NOTE — Telephone Encounter (Signed)
Called the Patient and state states that when her pulse was checked it was 52. Patient declines any symptoms and states that she feels fine. Patient states her heart rate has been low like this before. States the home health nurse was concerned.   Patient states she had been at rest for 2 hours before having her pulse checked. States that she walked from the bedroom to sit in the living room with her walker. States at that time her head felt funny and she was feeling weak but felt fine once she sat down again. When re-checking her pulse it was still 52. Patient states she felt she was experiencing some Afib and heart skipping beats at the time of the low reading.   After staying seated all brain fog, weakness, and Afib symptoms went away.   Please advise

## 2021-01-20 NOTE — Telephone Encounter (Signed)
Okay for verbal order

## 2021-01-20 NOTE — Progress Notes (Addendum)
Telephone Note  I connected with [name]  on 01/15/21 at  1:30 PM EDT by telephone  and verified that I am speaking with the correct person using two identifiers.  Location patient: home, [place] Location provider:work or home office Persons participating in the virtual visit: patient, provider, pts husband  I discussed the limitations of evaluation and management by telemedicine and the availability of in person appointments. The patient expressed understanding and agreed to proceed.   HPI:  Acute telemedicine visit for : 9/20221 having left shoulder pain following multiple strokes since 11/2019 had 2-3  cortisone shots with Dr. Roland Rack or his office and did not help having arm pain going down her  arm in the front and back of shoulder and muscle pain s/p right shoulder surgery years ago with Dr. Roland Rack reviewed his last note 01/10/21 and xray had 08/05/20 FINDINGS:    Glenohumeral and acromioclavicular joint alignment appears normal.    Questionable increased sclerosis along the surgical neck could represent  subacute, nondisplaced fracture or degenerative change.  This did not  appear to be present on previous x-ray from the hospital.  Moderate-severe  glenohumeral degenerative joint change with joint space narrowing,  osteophyte formation.  No soft tissue swelling or joint effusion.  No  destructive bony lesion is identified.  2. Htn confusion with h/o on meds at one time she was on losartan hctz 50-12.5 stopped due to aki now on losartan 25 mg qd, on list toprol xl 25 mg bid but advised should be taking qd,  Sbp at times 150/80 advised pt with stroke history goal is <130/<80   3. DM 2 last A1C 8.1 08/22/20 on januvia 50 mg qd    -COVID-19 vaccine status: 3/3  ROS: See pertinent positives and negatives per HPI.  Past Medical History:  Diagnosis Date   Abnormal antibody titer    Anxiety and depression    Arthritis    Asthma    Atypical chest pain    a. 03/2018 MV: No  ischemia/infarct. EF >65%.   Carotid arterial disease ([place])    a. 03/3084 U/S: RICA <57, LICA nl.   Cystocele    Depression    Diabetes ([place])    DVT (deep venous thrombosis) ([place])    Righ calf   Dyspnea    11/08/2018 - "more now due to lack of exercise"   Edema    GERD (gastroesophageal reflux disease)    Heart murmur    a. 08/2013 Echo: EF 60-65%, Mild MR (poss veg seen on TTE, not seen on TEE), nl LV size with mod conc LVH. Mild LAE; b. 08/2019 Echo: EF 60-65%, no rwma. Nl RV size/fxn. No significant valvular dzs.   History of IBS    History of kidney stones    History of methicillin resistant staphylococcus aureus (MRSA) 2008   History of multiple strokes    Hyperlipidemia    Hypertension    Hypothyroidism    Insomnia    Kidney stone    kidney stones with lithotripsy .  [facility]- "adrenal glands"   Neuropathy involving both lower extremities    Non-healing ulcer of left foot (HCC)    Obesity, Class II, BMI 35-39.9, with comorbidity    PAF (paroxysmal atrial fibrillation) ([place])    a. 07/2013 Event monitor: Freq PVCs and 3 short runs of Afib-->CHA2DS2VASc = 7-->Eliquis.   Peripheral vascular disease ([place])    a. 03/2018 LE Angio: Nl renal arteries. Nl aorta & iliacs. Nl LLE arterial flow;  b. 06/2020 ABI's:   Sleep apnea    use C-PAP   Urinary incontinence    Venous stasis dermatitis of both lower extremities     Past Surgical History:  Procedure Laterality Date   ANKLE SURGERY Right    APPLICATION OF WOUND VAC Left 06/27/2015   Procedure: APPLICATION OF WOUND VAC ( POSSIBLE ) ;  Surgeon: [name], MD;  Location: [facility] ORS;  Service: Vascular;  Laterality: Left;   APPLICATION OF WOUND VAC Left 09/25/2016   Procedure: APPLICATION OF WOUND VAC;  Surgeon: [name], DPM;  Location: [facility] ORS;  Service: Podiatry;  Laterality: Left;   arm surgery     right     CHOLECYSTECTOMY     COLONOSCOPY     COLONOSCOPY WITH PROPOFOL N/A 01/11/2017   Procedure: COLONOSCOPY WITH  PROPOFOL;  Surgeon: [name], MD;  Location: [facility] ENDOSCOPY;  Service: Endoscopy;  Laterality: N/A;   COLONOSCOPY WITH PROPOFOL N/A 02/22/2017   Procedure: COLONOSCOPY WITH PROPOFOL;  Surgeon: [name], MD;  Location: [facility] ENDOSCOPY;  Service: Endoscopy;  Laterality: N/A;   COLONOSCOPY WITH PROPOFOL N/A 05/31/2017   Procedure: COLONOSCOPY WITH PROPOFOL;  Surgeon: [name], MD;  Location: [facility] ENDOSCOPY;  Service: Endoscopy;  Laterality: N/A;   ESOPHAGOGASTRODUODENOSCOPY (EGD) WITH PROPOFOL N/A 01/11/2017   Procedure: ESOPHAGOGASTRODUODENOSCOPY (EGD) WITH PROPOFOL;  Surgeon: [name], MD;  Location: [facility] ENDOSCOPY;  Service: Endoscopy;  Laterality: N/A;   ESOPHAGOGASTRODUODENOSCOPY (EGD) WITH PROPOFOL N/A 10/25/2018   Procedure: ESOPHAGOGASTRODUODENOSCOPY (EGD) WITH PROPOFOL;  Surgeon: [name], MD;  Location[facility] ENDOSCOPY;  Service: Endoscopy;  Laterality: N/A;   ESOPHAGOGASTRODUODENOSCOPY (EGD) WITH PROPOFOL N/A 11/29/2019   Procedure: ESOPHAGOGASTRODUODENOSCOPY (EGD) WITH PROPOFOL;  Surgeon: Toledo, Benay Pike, MD;  Location: [facility] ENDOSCOPY;  Service: Gastroenterology;  Laterality: N/A;   HERNIA REPAIR     umbilical   HIATAL HERNIA REPAIR     I & D EXTREMITY Left 06/27/2015   Procedure: IRRIGATION AND DEBRIDEMENT EXTREMITY            ( CALF HEMATOMA ) POSSIBLE WOUND VAC;  Surgeon: [name], MD;  Location: [facility] ORS;  Service: Vascular;  Laterality: Left;   INCISION AND DRAINAGE OF WOUND Left 09/25/2016   Procedure: IRRIGATION AND DEBRIDEMENT - PARTIAL RESECTION OF ACHILLES TENDON WITH WOUND VAC APPLICATION;  Surgeon: [name], DPM;  Location: [facility] ORS;  Service: Podiatry;  Laterality: Left;   INCISION AND DRAINAGE OF WOUND Left 11/09/2018   Procedure: Excision of left foot wound with ACell placement;  Surgeon: Wallace Going, DO;  Location: [place];  Service: Plastics;  Laterality: Left;   IRRIGATION AND DEBRIDEMENT ABSCESS Left 09/07/2016    Procedure: IRRIGATION AND DEBRIDEMENT ABSCESS LEFT HEEL;  Surgeon: [name], DPM;  Location: [facility] ORS;  Service: Podiatry;  Laterality: Left;   JOINT REPLACEMENT  2013   left knee replacement   KIDNEY SURGERY Right    kidney stones   LITHOTRIPSY     LOWER EXTREMITY ANGIOGRAPHY Left 04/04/2018   Procedure: LOWER EXTREMITY ANGIOGRAPHY;  Surgeon: [name], MD;  Location: [place] CV LAB;  Service: Cardiovascular;  Laterality: Left;   NM GATED MYOVIEW [facility] HX)  February 2017   Likely breast attenuation. LOW RISK study. Normal EF 55-60%.   OTHER SURGICAL HISTORY     08/2016 or 09/2016 surgery on achilles tenso h/o staph infection heal. Dr. [name]   removal of left hematoma Left    leg   REVERSE SHOULDER ARTHROPLASTY  Right 10/08/2015   Procedure: REVERSE SHOULDER ARTHROPLASTY;  Surgeon: [name], MD;  Location: [facility] ORS;  Service: Orthopedics;  Laterality: Right;   SLEEVE GASTROPLASTY     TONSILLECTOMY     TOTAL KNEE ARTHROPLASTY Left    TOTAL SHOULDER REPLACEMENT Right    TRANSESOPHAGEAL ECHOCARDIOGRAM  08/08/2013   Mild LVH, EF 60-65%. Moderate LA dilation and mild RA dilation. Mild MR with no evidence of stenosis and no evidence of endocarditis. A false chordae is noted.   TRANSTHORACIC ECHOCARDIOGRAM  08/03/2013   Mild-Moderate concentric LVH, EF 60-65%. Normal diastolic function. Mild LA dilation. Mild MR with possible vegetation  - not confirmed on TEE    ULNAR NERVE TRANSPOSITION Right 08/06/2016   Procedure: ULNAR NERVE DECOMPRESSION/TRANSPOSITION;  Surgeon: [name], MD;  Location: [facility] ORS;  Service: Orthopedics;  Laterality: Right;   UPPER GI ENDOSCOPY     VAGINAL HYSTERECTOMY       Current Outpatient Medications:    acetaminophen (TYLENOL) 325 MG tablet, Take 2 tablets (650 mg total) by mouth every 6 (six) hours as needed for mild pain or moderate pain (or Fever >/= 101)., Disp: , Rfl:    albuterol (PROVENTIL) (2.5 MG/3ML) 0.083% nebulizer solution, Take  3 mLs (2.5 mg total) by nebulization every 4 (four) hours as needed for wheezing or shortness of breath., Disp: 75 mL, Rfl: 12   ALPRAZolam (XANAX) 0.5 MG tablet, TAKE 1/2 TABLET BY MOUTH TWICE DAILY AS NEEDED FOR ANXIETY, Disp: 60 tablet, Rfl: 5   buPROPion (WELLBUTRIN XL) 150 MG 24 hr tablet, Take 1 tablet (150 mg total) by mouth daily., Disp: 90 tablet, Rfl: 3   calcium-vitamin D (OSCAL WITH D) 500-200 MG-UNIT tablet, Take 1 tablet by mouth 2 (two) times daily with a meal., Disp: 60 tablet, Rfl: 0   cholecalciferol (VITAMIN D) 25 MCG tablet, Take 1 tablet (1,000 Units total) by mouth 2 (two) times daily with a meal., Disp: 30 tablet, Rfl: 2   clobetasol cream (TEMOVATE) 5.85 %, Apply 1 application topically 2 (two) times daily as needed (skin irritation). , Disp: , Rfl:    colchicine 0.6 MG tablet, Take 1 tablet (0.6 mg total) by mouth as needed. May repeat 1 pill in 1 hr. No more than 3 total pills 1.8 mg in 24 hours as needed gout flare, Disp: 30 tablet, Rfl: 2   collagenase (SANTYL) ointment, Apply 1 application topically at bedtime. X7-10 days, Disp: 30 g, Rfl: 0   diclofenac Sodium (VOLTAREN) 1 % GEL, Apply 2 g topically 3 (three) times daily., Disp: 100 g, Rfl: 2   doxycycline (VIBRA-TABS) 100 MG tablet, Take 1 tablet (100 mg total) by mouth 2 (two) times daily. With food, Disp: 20 tablet, Rfl: 0   DULoxetine (CYMBALTA) 60 MG capsule, Take 1 capsule (60 mg total) by mouth daily., Disp: 90 capsule, Rfl: 3   ergocalciferol (VITAMIN D2) 1.25 MG (50000 UT) capsule, Take 50,000 Units by mouth once a week., Disp: , Rfl:    estradiol (ESTRACE) 0.1 MG/GM vaginal cream, Place 1 Applicatorful vaginally every Monday, Wednesday, and Friday. Apply 0.$RemoveBeforeD'5mg'[id number]$  (pea-sized amount)  just inside the vaginal introitus with a finger-tip on Monday, Wednesday and Friday nights,, Disp: 42.5 g, Rfl: 11   fesoterodine (TOVIAZ) 8 MG TB24 tablet, Take 1 tablet (8 mg total) by mouth daily., Disp: 90 tablet, Rfl: 3    fluticasone-salmeterol (ADVAIR HFA) 115-21 MCG/ACT inhaler, Inhale 2 puffs into the lungs daily. Rinse mouth thoroughly after use, Disp: 1 each,  Rfl: 12   folic acid (FOLVITE) 1 MG tablet, Take 1 tablet (1 mg total) by mouth daily., Disp: 90 tablet, Rfl: 3   HYDROcodone-acetaminophen (NORCO/VICODIN) 5-325 MG tablet, Take 1 tablet by mouth 2 (two) times daily as needed for severe pain. Must last 30 days, Disp: 45 tablet, Rfl: 0   levocetirizine (XYZAL) 5 MG tablet, Take 1 tablet (5 mg total) by mouth at bedtime as needed for allergies., Disp: 90 tablet, Rfl: 3   levothyroxine (SYNTHROID) 175 MCG tablet, Take 1 tablet (175 mcg total) by mouth daily before breakfast. On SUNDAYS take 1/2 pill, Disp: 90 tablet, Rfl: 3   metFORMIN (GLUCOPHAGE) 500 MG tablet, Take 1 tablet (500 mg total) by mouth daily with breakfast. With food, Disp: 90 tablet, Rfl: 3   metoprolol succinate (TOPROL XL) 25 MG 24 hr tablet, Take 1 tablet (25 mg total) by mouth daily., Disp: 90 tablet, Rfl: 1   Misc. Devices (TUB TRANSFER BOARD) MISC, 1 Device by Does not apply route as needed., Disp: 1 each, Rfl: 0   montelukast (SINGULAIR) 10 MG tablet, Take 1 tablet (10 mg total) by mouth at bedtime., Disp: 90 tablet, Rfl: 3   Multiple Vitamin (MULTIVITAMIN WITH MINERALS) TABS tablet, Take 1 tablet by mouth daily., Disp: , Rfl:    mupirocin ointment (BACTROBAN) 2 %, Apply 1 application topically 2 (two) times daily. Right lower leg, Disp: 30 g, Rfl: 2   ondansetron (ZOFRAN) 4 MG tablet, Take 1 tablet (4 mg total) by mouth every 8 (eight) hours as needed for nausea or vomiting., Disp: 90 tablet, Rfl: 1   oxybutynin (DITROPAN XL) 15 MG 24 hr tablet, Take 1 tablet (15 mg total) by mouth at bedtime., Disp: 30 tablet, Rfl: 3   pantoprazole (PROTONIX) 40 MG tablet, Take 40 mg by mouth daily. , Disp: , Rfl:    pregabalin (LYRICA) 50 MG capsule, TAKE 1 CAPSULE (50 MG) BY MOUTH 3 TIMES DAILY, Disp: 90 capsule, Rfl: 0   rosuvastatin (CRESTOR) 20 MG  tablet, TAKE 1 TABLET BY  MOUTH ONCE DAILY, Disp: 30 tablet, Rfl: 5   sitaGLIPtin (JANUVIA) 50 MG tablet, Take 1 tablet (50 mg total) by mouth daily., Disp: 90 tablet, Rfl: 3   sucralfate (CARAFATE) 1 GM/10ML suspension, Take 1 g by mouth 3 (three) times daily. , Disp: , Rfl:    thiamine 100 MG tablet, Take 0.5 tablets (50 mg total) by mouth 2 (two) times daily with a meal., Disp: 60 tablet, Rfl: 2   vitamin B-12 1000 MCG tablet, Take 1 tablet (1,000 mcg total) by mouth daily., Disp: 30 tablet, Rfl: 2   apixaban (ELIQUIS) 5 MG TABS tablet, TAKE 1 TABLET BY MOUTH TWICE DAILY, Disp: 180 tablet, Rfl: 1   HYDROcodone-acetaminophen (NORCO/VICODIN) 5-325 MG tablet, Take 1 tablet by mouth 2 (two) times daily as needed for severe pain. Must last 30 days, Disp: 45 tablet, Rfl: 0   HYDROcodone-acetaminophen (NORCO/VICODIN) 5-325 MG tablet, Take 1 tablet by mouth 2 (two) times daily as needed for severe pain. Must last 30 days, Disp: 45 tablet, Rfl: 0   losartan (COZAAR) 50 MG tablet, Take 1 tablet (50 mg total) by mouth daily. D/c losartan 50-12.5, Disp: 90 tablet, Rfl: 3  EXAM:  VITALS per patient if applicable:  GENERAL: alert, oriented, appears well and in no acute distress  PSYCH/NEURO: pleasant and cooperative, no obvious depression or anxiety, speech and thought processing grossly intact  ASSESSMENT AND PLAN:  Discussed the following assessment and plan:  Chronic left shoulder pain - Plan: MR Shoulder Left Wo Contrast Arthritis of left shoulder region - Plan: MR Shoulder Left Wo Contrast, DG Humerus Left Pain in humerus - Plan: MR Shoulder Left Wo Contrast, DG Humerus Left F/u ortho Dr. Roland Rack though he does not think pt surgical candidate and steroid injections havnt helped in the past   Hypertension, unspecified type - Plan: losartan (COZAAR) 50 MG tablet toprol xl 25 mg qd stopped hctz due to aki in the past can always increased to losartan 100 mg qd  Hypertension associated with diabetes  ([place]) - Plan: sitaGLIPtin (JANUVIA) 50 MG tablet last A1c 8.1  Metformin 500 mg qd   HM Labs [facility] 01/29/21  WBC 5.6 h/h 9.6/32.8 plts 188  Glucose 118  BUN 26 nl  Cr 1.22  GFR 47 L  Na 142, K 5.1  Cl 100  Co2 19  Calcium 8.7 nl  Total protein 6.5 nl  Albumin 4.1 nl  Total bili 0.4 nl  Alk phos 119 nl  Ast 24 nl  Alt 14 nl  Lipid TC 104, Tg 71, hld 59, ldl 30   Tibc 452 H nl 250-450  UIBC 421 H nl 118-369 Iron 31 nl 27-139 Iron sat 7 low nl 15-55 Ferritin 15 nl 15-150   A1c 7.7 goal for age <8.0   TSH 0.137 L reduce levo from 175 to 150 mcg qd 02/05/21   Flu shot due utd pna 23, prevnar  Tdap utd   consider shingrix in future rx today for this 08/22/20   Given Rx twinrix in past to get at pharmacy h/o fatty liver  covid had 3/3 pfizer consider booster   DEXA 10/17/14 normal    Out of age window pap Ob/gyn    mammo neg 08/09/17 negative  -referred previously 08/17/19 negative 08/21/20 normal    05/31/17 [facility] colonoscopy with diverticulosis and polyps multiple tubular and hyperplastic due in 2023   EGD had 10/25/2018 repeat due in 1 year [facility] Dr. Gustavo Lah    -we discussed possible serious and likely etiologies, options for evaluation and workup, limitations of telemedicine visit vs in person visit, treatment, treatment risks and precautions. Pt is agreeable to treatment via telemedicine at this moment.    I discussed the assessment and treatment plan with the patient. The patient was provided an opportunity to ask questions and all were answered. The patient agreed with the plan and demonstrated an understanding of the instructions.    Time spent 20 minutes  Delorise Jackson, MD

## 2021-01-20 NOTE — Telephone Encounter (Signed)
[facility] should be on  Losartan 50 mg daily  Toprol XL 25 mg daily  Januvia 50 mg daily  Metformin 500 mg daily   Take all combination meds and incorrect SIGs off file at pharmacy please

## 2021-01-20 NOTE — Telephone Encounter (Signed)
Will call pharmacy about medications.     Patient asking that Dr Olivia Mackie [name] refill her Oxybutynin. States she will not be going back to [facility] and is currently waiting for her appointment with Uro-gynecology. State she is out of this medication and needs it send in before then.   Pended for your approval or denial.

## 2021-01-20 NOTE — Telephone Encounter (Signed)
[name] was seen today by [name] from [facility]. She conducted a Nature conservation officer for [employer] and is requesting that she been seen once a week for four weeks. Today was her first visit so it is included in the four weeks. She is requesting three addition weeks with patient.

## 2021-01-21 ENCOUNTER — Ambulatory Visit: Payer: Medicare HMO | Admitting: Obstetrics and Gynecology

## 2021-01-21 NOTE — Telephone Encounter (Signed)
Patient will be contacted by urology

## 2021-01-21 NOTE — Telephone Encounter (Signed)
Noted, Patient will be contacted by Cardiology for an appointment

## 2021-01-21 NOTE — Telephone Encounter (Signed)
[name] at [phone] and gave verbal

## 2021-01-22 ENCOUNTER — Telehealth: Payer: Self-pay | Admitting: Licensed Clinical Social Worker

## 2021-01-22 ENCOUNTER — Ambulatory Visit: Payer: Medicare HMO

## 2021-01-22 NOTE — Telephone Encounter (Signed)
LCSW received a call from pt spouse. He shares that he has started to receive bills for pt transportation. He is distressed b/c I had told him that transportation would be free. I inquired if he had been scheduling directly through [facility] and reviewed the number w/ him. Unfortunately, it appears he had been calling [facility] directly to schedule rides therefore was getting charged by their billing department. Upon chart review this had already been discussed with pt spouse the difference between [facility] and [facility] [employer] on 06/25/20- "Pt spouse shares that they called [facility] and they quoted them about $100 for transport. I shared again w/ pt spouse the [employer] number and encouraged him to call them directly to get questions answered straight from their services." We did assist w/ one time out of network ride at [facility] on 4/29 and at that time per chart review I "reviewed additional upcoming appointments and confirmed that wound care, pain clinic, pcp and [facility] are all in the system and they can utilize Transportation Services to get to those appointments." No bill should have been received from the ride as it was charged to cost center.   I encouraged him to call [facility] [employer] directly to ensure none of those charges were rides arranged through [facility], otherwise reminded him that in order to get rides to [facility] providers he would need to contact [phone] directly to schedule. If pt needs to see alternate providers then she would need to utilize alternate ride services. Pt husband states understanding, I apologized for the misunderstanding and encouraged him to keep [facility] transportation number handy as previously recommended.    Westley Hummer, MSW, [place]  [phone]

## 2021-01-22 NOTE — Telephone Encounter (Signed)
Faxed to the pharmacy via [facility] routing to update Patient medication list at the pharmacy

## 2021-01-24 ENCOUNTER — Telehealth: Payer: Self-pay | Admitting: Internal Medicine

## 2021-01-24 DIAGNOSIS — I872 Venous insufficiency (chronic) (peripheral): Secondary | ICD-10-CM | POA: Diagnosis not present

## 2021-01-24 DIAGNOSIS — I69319 Unspecified symptoms and signs involving cognitive functions following cerebral infarction: Secondary | ICD-10-CM | POA: Diagnosis not present

## 2021-01-24 DIAGNOSIS — G8194 Hemiplegia, unspecified affecting left nondominant side: Secondary | ICD-10-CM | POA: Diagnosis not present

## 2021-01-24 DIAGNOSIS — E1159 Type 2 diabetes mellitus with other circulatory complications: Secondary | ICD-10-CM | POA: Diagnosis not present

## 2021-01-24 DIAGNOSIS — J449 Chronic obstructive pulmonary disease, unspecified: Secondary | ICD-10-CM | POA: Diagnosis not present

## 2021-01-24 DIAGNOSIS — I4819 Other persistent atrial fibrillation: Secondary | ICD-10-CM | POA: Diagnosis not present

## 2021-01-24 DIAGNOSIS — I152 Hypertension secondary to endocrine disorders: Secondary | ICD-10-CM | POA: Diagnosis not present

## 2021-01-24 DIAGNOSIS — E1142 Type 2 diabetes mellitus with diabetic polyneuropathy: Secondary | ICD-10-CM | POA: Diagnosis not present

## 2021-01-24 DIAGNOSIS — E1152 Type 2 diabetes mellitus with diabetic peripheral angiopathy with gangrene: Secondary | ICD-10-CM | POA: Diagnosis not present

## 2021-01-24 DIAGNOSIS — I69320 Aphasia following cerebral infarction: Secondary | ICD-10-CM | POA: Diagnosis not present

## 2021-01-24 NOTE — Telephone Encounter (Signed)
Alyse Low from [facility] called in regards to patient retaining a lot of fluid. They are requesting labs be ordered in for pt and also if she could be prescribed a fluid pill since she has been prescribed fluid pills in the past.   [phone]

## 2021-01-27 DIAGNOSIS — I4819 Other persistent atrial fibrillation: Secondary | ICD-10-CM | POA: Diagnosis not present

## 2021-01-27 DIAGNOSIS — I152 Hypertension secondary to endocrine disorders: Secondary | ICD-10-CM | POA: Diagnosis not present

## 2021-01-27 DIAGNOSIS — E1159 Type 2 diabetes mellitus with other circulatory complications: Secondary | ICD-10-CM | POA: Diagnosis not present

## 2021-01-27 DIAGNOSIS — E1152 Type 2 diabetes mellitus with diabetic peripheral angiopathy with gangrene: Secondary | ICD-10-CM | POA: Diagnosis not present

## 2021-01-27 DIAGNOSIS — I69319 Unspecified symptoms and signs involving cognitive functions following cerebral infarction: Secondary | ICD-10-CM | POA: Diagnosis not present

## 2021-01-27 DIAGNOSIS — I69320 Aphasia following cerebral infarction: Secondary | ICD-10-CM | POA: Diagnosis not present

## 2021-01-27 DIAGNOSIS — I872 Venous insufficiency (chronic) (peripheral): Secondary | ICD-10-CM | POA: Diagnosis not present

## 2021-01-27 DIAGNOSIS — J449 Chronic obstructive pulmonary disease, unspecified: Secondary | ICD-10-CM | POA: Diagnosis not present

## 2021-01-27 DIAGNOSIS — E1142 Type 2 diabetes mellitus with diabetic polyneuropathy: Secondary | ICD-10-CM | POA: Diagnosis not present

## 2021-01-27 DIAGNOSIS — G8194 Hemiplegia, unspecified affecting left nondominant side: Secondary | ICD-10-CM | POA: Diagnosis not present

## 2021-01-27 NOTE — Telephone Encounter (Signed)
FYI mclean, [name]  Arianna,   Please call to triage patient.  This sounds severe as reports pain and swelling.  I have not examined patient.  She has a very complicated medical history.  She needs to be seen in person at urgent care today to ensure this is not early signs of infection, blood clot as well as exhibiting trouble breathing

## 2021-01-27 NOTE — Telephone Encounter (Signed)
Called and spoke to [place]. Ireanna states that she has been swelling for a week and a half and that she has had several strokes already. Pt is having trouble walking around and does not think the swelling is that bad and wanted to wait out the swelling. Pt was informed that due to the length of time of the swelling and the pain she is experiencing, it is best to be evaluated to avoid the possibility of a blood clot or infection. Pt was instructed to go to the Urgent care. Brigid verbalized understanding and states that she will go to be evaluated today.

## 2021-01-27 NOTE — Telephone Encounter (Signed)
Called to schedule Patient lab and follow up appointment. Nurse was with the Patient and took verbal to take fasting labs 01/29/21 morning.   Patient has fluid retention in both legs from the foot all the way up the legs causing pain and swelling. Pain rated 5/10 in both legs with slight red discoloration that is going away.   Please advise on fluid and swelling.

## 2021-01-28 ENCOUNTER — Ambulatory Visit (INDEPENDENT_AMBULATORY_CARE_PROVIDER_SITE_OTHER): Payer: Medicare HMO | Admitting: Nurse Practitioner

## 2021-01-28 ENCOUNTER — Other Ambulatory Visit: Payer: Self-pay

## 2021-01-28 ENCOUNTER — Encounter: Payer: Medicare HMO | Attending: Physician Assistant | Admitting: Physician Assistant

## 2021-01-28 ENCOUNTER — Encounter: Payer: Self-pay | Admitting: Nurse Practitioner

## 2021-01-28 VITALS — BP 140/68 | HR 48 | Ht 66.0 in | Wt 255.0 lb

## 2021-01-28 DIAGNOSIS — G4733 Obstructive sleep apnea (adult) (pediatric): Secondary | ICD-10-CM | POA: Diagnosis not present

## 2021-01-28 DIAGNOSIS — L84 Corns and callosities: Secondary | ICD-10-CM | POA: Insufficient documentation

## 2021-01-28 DIAGNOSIS — L97522 Non-pressure chronic ulcer of other part of left foot with fat layer exposed: Secondary | ICD-10-CM | POA: Insufficient documentation

## 2021-01-28 DIAGNOSIS — E11621 Type 2 diabetes mellitus with foot ulcer: Secondary | ICD-10-CM | POA: Insufficient documentation

## 2021-01-28 DIAGNOSIS — I10 Essential (primary) hypertension: Secondary | ICD-10-CM

## 2021-01-28 DIAGNOSIS — L97812 Non-pressure chronic ulcer of other part of right lower leg with fat layer exposed: Secondary | ICD-10-CM | POA: Insufficient documentation

## 2021-01-28 DIAGNOSIS — I872 Venous insufficiency (chronic) (peripheral): Secondary | ICD-10-CM | POA: Insufficient documentation

## 2021-01-28 DIAGNOSIS — M86572 Other chronic hematogenous osteomyelitis, left ankle and foot: Secondary | ICD-10-CM | POA: Diagnosis not present

## 2021-01-28 DIAGNOSIS — L97526 Non-pressure chronic ulcer of other part of left foot with bone involvement without evidence of necrosis: Secondary | ICD-10-CM | POA: Diagnosis not present

## 2021-01-28 DIAGNOSIS — L97822 Non-pressure chronic ulcer of other part of left lower leg with fat layer exposed: Secondary | ICD-10-CM | POA: Insufficient documentation

## 2021-01-28 DIAGNOSIS — I5033 Acute on chronic diastolic (congestive) heart failure: Secondary | ICD-10-CM | POA: Diagnosis not present

## 2021-01-28 DIAGNOSIS — Z7901 Long term (current) use of anticoagulants: Secondary | ICD-10-CM | POA: Diagnosis not present

## 2021-01-28 DIAGNOSIS — E1169 Type 2 diabetes mellitus with other specified complication: Secondary | ICD-10-CM | POA: Insufficient documentation

## 2021-01-28 DIAGNOSIS — X58XXXA Exposure to other specified factors, initial encounter: Secondary | ICD-10-CM | POA: Insufficient documentation

## 2021-01-28 DIAGNOSIS — E1151 Type 2 diabetes mellitus with diabetic peripheral angiopathy without gangrene: Secondary | ICD-10-CM | POA: Diagnosis not present

## 2021-01-28 DIAGNOSIS — I480 Paroxysmal atrial fibrillation: Secondary | ICD-10-CM

## 2021-01-28 DIAGNOSIS — E785 Hyperlipidemia, unspecified: Secondary | ICD-10-CM | POA: Diagnosis not present

## 2021-01-28 MED ORDER — METOPROLOL SUCCINATE ER 25 MG PO TB24: 12.5000 mg | ORAL_TABLET | Freq: Every day | ORAL | 0 refills | Status: DC

## 2021-01-28 MED ORDER — FUROSEMIDE 20 MG PO TABS: 20.0000 mg | ORAL_TABLET | Freq: Every day | ORAL | 0 refills | Status: DC

## 2021-01-28 NOTE — Progress Notes (Signed)
Office Visit    Patient Name: [name] Date of Encounter: 01/28/2021  Primary Care Provider:  [name], [name], MD Primary Cardiologist:  Kathlyn Sacramento, MD  Chief Complaint    69 year old female with a history of paroxysmal atrial fibrillation, prior strokes, hypertension, hyperlipidemia, diabetes, obesity, chronic pain, DVT, sleep apnea on CPAP, and obesity, presents for follow-up of edema and orthopnea.  Past Medical History    Past Medical History:  Diagnosis Date   Abnormal antibody titer    Anxiety and depression    Arthritis    Asthma    Atypical chest pain    a. 03/2018 MV: No ischemia/infarct. EF >65%.   Carotid arterial disease ([place])    a. 10/3417 U/S: RICA <62, LICA nl.   Cystocele    Depression    Diabetes ([place])    DVT (deep venous thrombosis) ([place])    Righ calf   Dyspnea    11/08/2018 - "more now due to lack of exercise"   Edema    GERD (gastroesophageal reflux disease)    Heart murmur    a. 08/2013 Echo: EF 60-65%, Mild MR (poss veg seen on TTE, not seen on TEE), nl LV size with mod conc LVH. Mild LAE; b. 08/2019 Echo: EF 60-65%, no rwma. Nl RV size/fxn. No significant valvular dzs.   History of IBS    History of kidney stones    History of methicillin resistant staphylococcus aureus (MRSA) 2008   History of multiple strokes    Hyperlipidemia    Hypertension    Hypothyroidism    Insomnia    Kidney stone    kidney stones with lithotripsy .  [facility]- "adrenal glands"   Neuropathy involving both lower extremities    Non-healing ulcer of left foot (HCC)    Obesity, Class II, BMI 35-39.9, with comorbidity    PAF (paroxysmal atrial fibrillation) ([place])    a. 07/2013 Event monitor: Freq PVCs and 3 short runs of Afib-->CHA2DS2VASc = 7-->Eliquis.   Peripheral vascular disease ([place])    a. 03/2018 LE Angio: Nl renal arteries. Nl aorta & iliacs. Nl LLE arterial flow; b. 06/2020 ABI's:   Sleep apnea    use C-PAP   Urinary incontinence     Venous stasis dermatitis of both lower extremities    Past Surgical History:  Procedure Laterality Date   ANKLE SURGERY Right    APPLICATION OF WOUND VAC Left 06/27/2015   Procedure: APPLICATION OF WOUND VAC ( POSSIBLE ) ;  Surgeon: [name], MD;  Location: [facility] ORS;  Service: Vascular;  Laterality: Left;   APPLICATION OF WOUND VAC Left 09/25/2016   Procedure: APPLICATION OF WOUND VAC;  Surgeon: [name], DPM;  Location: [facility]S;  Service: Podiatry;  Laterality: Left;   arm surgery     right     CHOLECYSTECTOMY     COLONOSCOPY     COLONOSCOPY WITH PROPOFOL N/A 01/11/2017   Procedure: COLONOSCOPY WITH PROPOFOL;  Surgeon: [name], MD;  Location: [facility] ENDOSCOPY;  Service: Endoscopy;  Laterality: N/A;   COLONOSCOPY WITH PROPOFOL N/A 02/22/2017   Procedure: COLONOSCOPY WITH PROPOFOL;  Surgeon: [name], MD;  Location: [facility] ENDOSCOPY;  Service: Endoscopy;  Laterality: N/A;   COLONOSCOPY WITH PROPOFOL N/A 05/31/2017   Procedure: COLONOSCOPY WITH PROPOFOL;  Surgeon: [name], MD;  Location: [facility] ENDOSCOPY;  Service: Endoscopy;  Laterality: N/A;   ESOPHAGOGASTRODUODENOSCOPY (EGD) WITH PROPOFOL N/A 01/11/2017   Procedure: ESOPHAGOGASTRODUODENOSCOPY (EGD) WITH PROPOFOL;  Surgeon:  [name], MD;  Location: [facility] ENDOSCOPY;  Service: Endoscopy;  Laterality: N/A;   ESOPHAGOGASTRODUODENOSCOPY (EGD) WITH PROPOFOL N/A 10/25/2018   Procedure: ESOPHAGOGASTRODUODENOSCOPY (EGD) WITH PROPOFOL;  Surgeon: [name], MD;  Location: [facility] ENDOSCOPY;  Service: Endoscopy;  Laterality: N/A;   ESOPHAGOGASTRODUODENOSCOPY (EGD) WITH PROPOFOL N/A 11/29/2019   Procedure: ESOPHAGOGASTRODUODENOSCOPY (EGD) WITH PROPOFOL;  Surgeon: Toledo, Benay Pike, MD;  Location: [facility] ENDOSCOPY;  Service: Gastroenterology;  Laterality: N/A;   HERNIA REPAIR     umbilical   HIATAL HERNIA REPAIR     I & D EXTREMITY Left 06/27/2015   Procedure: IRRIGATION AND DEBRIDEMENT EXTREMITY            (  CALF HEMATOMA ) POSSIBLE WOUND VAC;  Surgeon: [name], MD;  Location: [facility];  Service: Vascular;  Laterality: Left;   INCISION AND DRAINAGE OF WOUND Left 09/25/2016   Procedure: IRRIGATION AND DEBRIDEMENT - PARTIAL RESECTION OF ACHILLES TENDON WITH WOUND VAC APPLICATION;  Surgeon: [name], DPM;  Location: [facility] ORS;  Service: Podiatry;  Laterality: Left;   INCISION AND DRAINAGE OF WOUND Left 11/09/2018   Procedure: Excision of left foot wound with ACell placement;  Surgeon: Wallace Going, DO;  Location: [place];  Service: Plastics;  Laterality: Left;   IRRIGATION AND DEBRIDEMENT ABSCESS Left 09/07/2016   Procedure: IRRIGATION AND DEBRIDEMENT ABSCESS LEFT HEEL;  Surgeon: [name], DPM;  Location: [facility];  Service: Podiatry;  Laterality: Left;   JOINT REPLACEMENT  2013   left knee replacement   KIDNEY SURGERY Right    kidney stones   LITHOTRIPSY     LOWER EXTREMITY ANGIOGRAPHY Left 04/04/2018   Procedure: LOWER EXTREMITY ANGIOGRAPHY;  Surgeon: [name], MD;  Location: [place] [facility];  Service: Cardiovascular;  Laterality: Left;   NM GATED MYOVIEW [facility] HX)  February 2017   Likely breast attenuation. LOW RISK study. Normal EF 55-60%.   OTHER SURGICAL HISTORY     08/2016 or 09/2016 surgery on achilles tenso h/o staph infection heal. Dr. [name]   removal of left hematoma Left    leg   REVERSE SHOULDER ARTHROPLASTY Right 10/08/2015   Procedure: REVERSE SHOULDER ARTHROPLASTY;  Surgeon: [name], MD;  Location: [facility] ORS;  Service: Orthopedics;  Laterality: Right;   SLEEVE GASTROPLASTY     TONSILLECTOMY     TOTAL KNEE ARTHROPLASTY Left    TOTAL SHOULDER REPLACEMENT Right    TRANSESOPHAGEAL ECHOCARDIOGRAM  08/08/2013   Mild LVH, EF 60-65%. Moderate LA dilation and mild RA dilation. Mild MR with no evidence of stenosis and no evidence of endocarditis. A false chordae is noted.   TRANSTHORACIC ECHOCARDIOGRAM  08/03/2013   Mild-Moderate concentric LVH, EF 60-65%.  Normal diastolic function. Mild LA dilation. Mild MR with possible vegetation  - not confirmed on TEE    ULNAR NERVE TRANSPOSITION Right 08/06/2016   Procedure: ULNAR NERVE DECOMPRESSION/TRANSPOSITION;  Surgeon: [name], MD;  Location: [facility] ORS;  Service: Orthopedics;  Laterality: Right;   UPPER GI ENDOSCOPY     VAGINAL HYSTERECTOMY      Allergies  Allergies  Allergen Reactions   Morphine And Related Other (See Comments) and Nausea Only    Patient becomes very confused Other reaction(s): Other (See Comments), Other (See Comments) Patient becomes very confused Patient becomes very confused    Penicillins Hives, Shortness Of Breath, Swelling and Other (See Comments)    Facial swelling Has patient had a PCN reaction causing immediate rash, facial/tongue/throat swelling, SOB or lightheadedness  with hypotension: Yes Has patient had a PCN reaction causing severe rash involving mucus membranes or skin necrosis: Yes Has patient had a PCN reaction that required hospitalization No Has patient had a PCN reaction occurring within the last 10 years: No If all of the above answers are "NO", then may proceed with Cephalosporin use.  Other reaction(s): Other (See Comments), Other (See Comments) Facial swelling Has patient had a PCN reaction causing immediate rash, facial/tongue/throat swelling, SOB or lightheadedness with hypotension: Yes Has patient had a PCN reaction causing severe rash involving mucus membranes or skin necrosis: Yes Has patient had a PCN reaction that required hospitalization No Has patient had a PCN reaction occurring within the last 10 years: No If all of the above answers are "NO", then may proceed with Cephalosporin use. Facial swelling Has patient had a PCN reaction causing immediate rash, facial/tongue/throat swelling, SOB or lightheadedness with hypotension: Yes Has patient had a PCN reaction causing severe rash involving mucus membranes or skin necrosis: Yes Has  patient had a PCN reaction that required hospitalization No Has patient had a PCN reaction occurring within the last 10 years: No If all of the above answers are "NO", then may proceed with Cephalosporin use. Facial swelling Has patient had a PCN reaction causing immediate rash, facial/tongue/throat swelling, SOB or lightheadedness with hypotension: Yes Has patient had a PCN reaction causing severe rash involving mucus membranes or skin necrosis: Yes Has patient had a PCN reaction that required hospitalization No Has patient had a PCN reaction occurring within the last 10 years: No If all of the above answers are "NO", then may proceed with Cephalosporin use.    Ambien [Zolpidem]     Hallucination    Aspirin Hives   Oxytetracycline Hives    Other reaction(s): Unknown     History of Present Illness    70 year old female with the above complex past medical history including paroxysmal atrial fibrillation, strokes, hypertension, hyperlipidemia, diabetes, obesity, chronic pain, DVT, sleep apnea, and obesity.  She also has a history of chronic left foot ulceration followed by vascular surgery.  Angiogram in 2020 showed no evidence of PAD and she subsequently underwent debridement with improvement.  In the setting of atypical chest pain, she underwent stress testing in January 2020, which was nonischemic.  In June 2021, she was admitted with pneumonia, sepsis, and mild troponin elevation to a peak of 170.  Echocardiogram showed an EF of 60 to 65% without regional wall motion abnormalities.  It was felt that troponin elevation was secondary to demand ischemia:.  She was admitted in late September 2021 with altered mental status, and left upper extremity weakness.  MRI of the brain showed bilateral small multiple acute strokes suggestive of embolic etiology.  She was on Pradaxa at that time and this was transitioned to Eliquis.  Unfortunately, she suffered another stroke in November 2021.  Neurology  suspected recurrent strokes are due to severe malnutrition given gastric sleeve and noncompliance with vitamins.  In January 2022, she was admitted again for altered mental status and concern for stroke, though this was ruled out.  Echo with bubble study showed normal LV function and no evidence of ASD or PFO.  Ms. Lacap was last seen in cardiology clinic in May 2022, at which time she reported occasional lightheadedness and was noted to be mildly bradycardic.  Metoprolol dose was reduced to 25 mg daily.  Since her last visit, she has had multiple medical issues though no significant cardiac issues until about  2 weeks ago, when she began to experience increasing lower extremity edema predominantly impacting her right lower leg and bilateral thighs.  She is also noticed orthopnea over the same timeframe.  Over the past few years, she and her husband have been eating takeout for just about every meal.  More recently however, they have been frequently snacking on potato chips and [place] onion dip throughout the day.  Patient says she knew when she started having swelling orthopnea that that was probably the culprit of her problems.  Activity is very limited throughout the day and so she does not necessarily experience dyspnea on exertion or chest pain.  She denies palpitations, PND, dizziness, syncope, or early satiety.  Home Medications    Current Outpatient Medications  Medication Sig Dispense Refill   acetaminophen (TYLENOL) 325 MG tablet Take 2 tablets (650 mg total) by mouth every 6 (six) hours as needed for mild pain or moderate pain (or Fever >/= 101).     albuterol (PROVENTIL) (2.5 MG/3ML) 0.083% nebulizer solution Take 3 mLs (2.5 mg total) by nebulization every 4 (four) hours as needed for wheezing or shortness of breath. 75 mL 12   ALPRAZolam (XANAX) 0.5 MG tablet TAKE 1/2 TABLET BY MOUTH TWICE DAILY AS NEEDED FOR ANXIETY 60 tablet 5   apixaban (ELIQUIS) 5 MG TABS tablet TAKE 1 TABLET BY MOUTH  TWICE DAILY 180 tablet 1   buPROPion (WELLBUTRIN XL) 150 MG 24 hr tablet Take 1 tablet (150 mg total) by mouth daily. 90 tablet 3   calcium-vitamin D (OSCAL WITH D) 500-200 MG-UNIT tablet Take 1 tablet by mouth 2 (two) times daily with a meal. 60 tablet 0   cephALEXin (KEFLEX) 500 MG capsule Take 500 mg by mouth 3 (three) times daily.     cholecalciferol (VITAMIN D) 25 MCG tablet Take 1 tablet (1,000 Units total) by mouth 2 (two) times daily with a meal. 30 tablet 2   clobetasol cream (TEMOVATE) 5.57 % Apply 1 application topically 2 (two) times daily as needed (skin irritation).      colchicine 0.6 MG tablet Take 1 tablet (0.6 mg total) by mouth as needed. May repeat 1 pill in 1 hr. No more than 3 total pills 1.8 mg in 24 hours as needed gout flare 30 tablet 2   collagenase (SANTYL) ointment Apply 1 application topically at bedtime. X7-10 days 30 g 0   diclofenac Sodium (VOLTAREN) 1 % GEL Apply 2 g topically 3 (three) times daily. 100 g 2   doxycycline (VIBRA-TABS) 100 MG tablet Take 1 tablet (100 mg total) by mouth 2 (two) times daily. With food 20 tablet 0   DULoxetine (CYMBALTA) 60 MG capsule Take 1 capsule (60 mg total) by mouth daily. 90 capsule 3   ergocalciferol (VITAMIN D2) 1.25 MG (50000 UT) capsule Take 50,000 Units by mouth once a week.     estradiol (ESTRACE) 0.1 MG/GM vaginal cream Place 1 Applicatorful vaginally every Monday, Wednesday, and Friday. Apply 0.5mg  (pea-sized amount)  just inside the vaginal introitus with a finger-tip on Monday, Wednesday and Friday nights, 42.5 g 11   fesoterodine (TOVIAZ) 8 MG TB24 tablet Take 1 tablet (8 mg total) by mouth daily. 90 tablet 3   fluconazole (DIFLUCAN) 150 MG tablet Take by mouth daily.     fluticasone-salmeterol (ADVAIR HFA) 115-21 MCG/ACT inhaler Inhale 2 puffs into the lungs daily. Rinse mouth thoroughly after use 1 each 12   folic acid (FOLVITE) 1 MG tablet Take 1 tablet (1 mg total)  by mouth daily. 90 tablet 3    HYDROcodone-acetaminophen (NORCO/VICODIN) 5-325 MG tablet Take 1 tablet by mouth 2 (two) times daily as needed for severe pain. Must last 30 days 45 tablet 0   levocetirizine (XYZAL) 5 MG tablet Take 1 tablet (5 mg total) by mouth at bedtime as needed for allergies. 90 tablet 3   levothyroxine (SYNTHROID) 175 MCG tablet Take 1 tablet (175 mcg total) by mouth daily before breakfast. On SUNDAYS take 1/2 pill 90 tablet 3   losartan (COZAAR) 50 MG tablet Take 1 tablet (50 mg total) by mouth daily. D/c losartan 50-12.5 90 tablet 3   metFORMIN (GLUCOPHAGE) 500 MG tablet Take 1 tablet (500 mg total) by mouth daily with breakfast. With food 90 tablet 3   metoprolol succinate (TOPROL XL) 25 MG 24 hr tablet Take 1 tablet (25 mg total) by mouth daily. 90 tablet 1   Misc. Devices (TUB TRANSFER BOARD) MISC 1 Device by Does not apply route as needed. 1 each 0   montelukast (SINGULAIR) 10 MG tablet Take 1 tablet (10 mg total) by mouth at bedtime. 90 tablet 3   Multiple Vitamin (MULTIVITAMIN WITH MINERALS) TABS tablet Take 1 tablet by mouth daily.     mupirocin ointment (BACTROBAN) 2 % Apply 1 application topically 2 (two) times daily. Right lower leg 30 g 2   ondansetron (ZOFRAN) 4 MG tablet Take 1 tablet (4 mg total) by mouth every 8 (eight) hours as needed for nausea or vomiting. 90 tablet 1   oxybutynin (DITROPAN XL) 15 MG 24 hr tablet Take 1 tablet (15 mg total) by mouth at bedtime. Further refills urogyn Dr. Wannetta Sender if appropriate 90 tablet 0   pantoprazole (PROTONIX) 40 MG tablet Take 40 mg by mouth daily.      pregabalin (LYRICA) 50 MG capsule TAKE 1 CAPSULE (50 MG) BY MOUTH 3 TIMES DAILY 90 capsule 0   rosuvastatin (CRESTOR) 20 MG tablet TAKE 1 TABLET BY  MOUTH ONCE DAILY 30 tablet 5   sitaGLIPtin (JANUVIA) 50 MG tablet Take 1 tablet (50 mg total) by mouth daily. 90 tablet 3   sucralfate (CARAFATE) 1 GM/10ML suspension Take 1 g by mouth 3 (three) times daily.      thiamine 100 MG tablet Take 0.5 tablets  (50 mg total) by mouth 2 (two) times daily with a meal. 60 tablet 2   vitamin B-12 1000 MCG tablet Take 1 tablet (1,000 mcg total) by mouth daily. 30 tablet 2   No current facility-administered medications for this visit.     Review of Systems    2-week history of increasing lower extremity edema, increasing abdominal girth, weight gain, and orthopnea.  She denies chest pain, palpitations, PND, dizziness, syncope, or early satiety all other systems reviewed and are otherwise negative except as noted above.    Physical Exam    VS:  BP 140/68   Pulse (!) 48   Ht 5\' 6"  (1.676 m)   Wt 255 lb (115.7 kg)   SpO2 96%   BMI 41.16 kg/m  , BMI Body mass index is 41.16 kg/m.     GEN: Obese, in no acute distress. HEENT: normal. Neck: Supple, obese, difficult to gauge JVP.  No carotid bruits, or masses. Cardiac: RRR, bradycardic, no murmurs, rubs, or gallops. No clubbing, cyanosis, 1-2+ bilateral lower extremity edema predominantly impacting the upper half of her lower legs (right lower leg is wrapped), and lower thighs. Radials 2+ and equal bilaterally. Respiratory:  Respirations regular and unlabored,  clear to auscultation bilaterally. GI: Obese, soft, nontender, nondistended, BS + x 4. MS: no deformity or atrophy. Skin: warm and dry, no rash. Neuro:  Strength and sensation are intact. Psych: Normal affect.  Accessory Clinical Findings    ECG personally reviewed by me today -sinus bradycardia with first-degree AV block, 48- no acute changes.  Lab Results  Component Value Date   WBC 7.5 08/22/2020   HGB 11.1 (L) 08/22/2020   HCT 34.5 (L) 08/22/2020   MCV 80.9 08/22/2020   PLT 257.0 08/22/2020   Lab Results  Component Value Date   CREATININE 1.19 10/15/2020   BUN 22 10/15/2020   NA 138 10/15/2020   K 4.8 10/15/2020   CL 102 10/15/2020   CO2 29 10/15/2020   Lab Results  Component Value Date   ALT 10 08/22/2020   AST 15 08/22/2020   ALKPHOS 100 08/22/2020   BILITOT 0.4  08/22/2020   Lab Results  Component Value Date   CHOL 65 04/07/2020   HDL 46 04/07/2020   LDLCALC 9 04/07/2020   TRIG 51 04/07/2020   CHOLHDL 1.4 04/07/2020    Lab Results  Component Value Date   HGBA1C 8.1 (H) 08/22/2020    Assessment & Plan    1.  Acute on chronic heart failure with preserved ejection fraction: Echo in June 2021 showed EF of 60 to 65%.  Over the past 2 years, patient and husband have been eating takeout for every meal and over the past few months, they have been snacking frequently on salted potato chips and [place] onion dip.  She says that she spends most of her day snacking and recognized recently that this was causing issues with edema.  Over the past 2 weeks, she has been having increasing lower extremity edema, increasing abdominal girth, weight gain, and orthopnea.  On examination, she does have 1-2+ bilateral lower extremity swelling as outlined above.  Her weight is recorded at 255 pounds on our scale today.  Review of historical weights shows that weight was previously in the mid 230s back in June and into the 160s in July.  She is having a battery of labs drawn at home tomorrow as ordered by primary care.  In that setting, I will not obtain any labs today.  Creatinine was previously normal at 1.19 in July.  I am adding Lasix 20 mg-2 tabs daily x3 days and then 1 tab daily.  I will see her back in approximately 1 week at which point, I will plan to repeat a basic metabolic panel.  We discussed the importance of daily weights, sodium restriction, medication compliance, and symptom reporting and she verbalizes understanding.  Pending response to diuretic therapy and changes in diet/sodium reduction, I will consider repeating an echo at follow-up.  2.  Essential hypertension: Blood pressure elevated at 140/68.  We will see how this changes with diuresis.  3.  Paroxysmal atrial fibrillation: ECG today shows sinus bradycardia with a first-degree AV block, which is stable.   No recent palpitations.  With a heart rate of 48, I am going to reduce her metoprolol to 12.5 mg daily.  She is anticoagulated with Eliquis.  4.  Hyperlipidemia: On rosuvastatin therapy with an LDL of January.  She had normal LFTs in June.  5.  History of strokes: Chronic debilitation with sedentary lifestyle.  Chronic anticoagulated with Eliquis in the setting #3.  6.  Obstructive sleep apnea: Using CPAP.  7.  Type 2 diabetes mellitus: A1c 8.1 in June.  She is on metformin and Januvia.  Could consider SGLT2 inhibitor in the future given evidence of volume overload/HFpEF.  8.  Disposition: Follow-up in 1 week.  Murray Hodgkins, NP 01/28/2021, 2:45 PM

## 2021-01-28 NOTE — Telephone Encounter (Signed)
noted 

## 2021-01-28 NOTE — Progress Notes (Addendum)
[name] ([id number]) Visit Report for 01/28/2021 Arrival Information Details Patient Name: [name], [name] Date of Service: 01/28/2021 3:15 PM Medical Record Number: [id number] Patient Account Number: [id number] Date of Birth/Sex: [birth year] (70 y.o. F) Treating RN: [name] Primary Care [name]: [name], [name] Other Clinician: Referring [name]: [name], [name] Treating [name]/Extender: [name] in Treatment: 28 Visit Information History Since Last Visit Added or deleted any medications: Yes Patient Arrived: Wheel Chair Had a fall or experienced change in No Arrival Time: 15:27 activities of daily living that may affect Accompanied By: husband risk of falls: Transfer Assistance: EasyPivot Patient Lift Hospitalized since last visit: No Patient Identification Verified: Yes Has Dressing in Place as Prescribed: Yes Secondary Verification Process Completed: Yes Has Compression in Place as Prescribed: Yes Patient Has Alerts: Yes Pain Present Now: Yes Patient Alerts: Patient on Blood Thinner Eliquis Diabetic II 06/2020 ABI L)1.22 R)1.14 Electronic Signature(s) Signed: 01/28/2021 4:33:42 PM By: [name] Entered By: [name] on 01/28/2021 15:37:29 [name], [name] ([id number]) -------------------------------------------------------------------------------- Clinic Level of Care Assessment Details Patient Name: [name] Date of Service: 01/28/2021 3:15 PM Medical Record Number: [id number] Patient Account Number: [id number] Date of Birth/Sex: [birth year] (70 y.o. F) Treating RN: [name] Primary Care [name]: [name], [name] Other Clinician: Referring [name]: [name], [name] Treating [name]/Extender: [name] in Treatment: 30 Clinic Level of Care Assessment Items TOOL 1 Quantity Score '[]'  - Use when EandM and Procedure is performed on INITIAL visit 0 ASSESSMENTS - Nursing Assessment / Reassessment '[]'   - General Physical Exam (combine w/ comprehensive assessment (listed just below) when performed on new 0 pt. evals) '[]'  - 0 Comprehensive Assessment (HX, ROS, Risk Assessments, Wounds Hx, etc.) ASSESSMENTS - Wound and Skin Assessment / Reassessment '[]'  - Dermatologic / Skin Assessment (not related to wound area) 0 ASSESSMENTS - Ostomy and/or Continence Assessment and Care '[]'  - Incontinence Assessment and Management 0 '[]'  - 0 Ostomy Care Assessment and Management (repouching, etc.) PROCESS - Coordination of Care '[]'  - Simple Patient / Family Education for ongoing care 0 '[]'  - 0 Complex (extensive) Patient / Family Education for ongoing care '[]'  - 0 Staff obtains Programmer, systems, Records, Test Results / Process Orders '[]'  - 0 Staff telephones HHA, Nursing Homes / Clarify orders / etc '[]'  - 0 Routine Transfer to another Facility (non-emergent condition) '[]'  - 0 Routine Hospital Admission (non-emergent condition) '[]'  - 0 New Admissions / Biomedical engineer / Ordering NPWT, Apligraf, etc. '[]'  - 0 Emergency Hospital Admission (emergent condition) PROCESS - Special Needs '[]'  - Pediatric / Minor Patient Management 0 '[]'  - 0 Isolation Patient Management '[]'  - 0 Hearing / Language / Visual special needs '[]'  - 0 Assessment of Community assistance (transportation, D/C planning, etc.) '[]'  - 0 Additional assistance / Altered mentation '[]'  - 0 Support Surface(s) Assessment (bed, cushion, seat, etc.) INTERVENTIONS - Miscellaneous '[]'  - External ear exam 0 '[]'  - 0 Patient Transfer (multiple staff / Civil Service fast streamer / Similar devices) '[]'  - 0 Simple Staple / Suture removal (25 or less) '[]'  - 0 Complex Staple / Suture removal (26 or more) '[]'  - 0 Hypo/Hyperglycemic Management (do not check if billed separately) '[]'  - 0 Ankle / Brachial Index (ABI) - do not check if billed separately Has the patient been seen at the hospital within the last three years: Yes Total Score: 0 Level Of Care: ____ [name]  ([id number]) Electronic Signature(s) Signed: 01/28/2021 4:33:42 PM By: [name] Entered ByDonnamarie [name] on 01/28/2021 16:10:58 [name], [name] (  [id number]) -------------------------------------------------------------------------------- Compression Therapy Details Patient Name: [name], [name] Date of Service: 01/28/2021 3:15 PM Medical Record Number: [id number] Patient Account Number: [id number] Date of Birth/Sex: [birth year] (70 y.o. F) Treating RN: [name] Primary Care [name]: [name], [name] Other Clinician: Referring [name]: [name], [name] Treating [name]/Extender: [name] in Treatment: 30 Compression Therapy Performed for Wound Assessment: Wound #13 Right,Midline Lower Leg Performed By: Dawn Argyle, RN Compression Type: Three Layer Post Procedure Diagnosis Same as Pre-procedure Electronic Signature(s) Signed: 01/28/2021 4:33:42 PM By: [name] Entered By: [name] on 01/28/2021 15:58:11 [name], [name] ([id number]) -------------------------------------------------------------------------------- Compression Therapy Details Patient Name: [name] Date of Service: 01/28/2021 3:15 PM Medical Record Number: [id number] Patient Account Number: [id number] Date of Birth/Sex: 02-03-1951 (70 y.o. F) Treating RN: [name] Primary Care Aquanetta Schwarz: [name], [name] Other Clinician: Referring Kelyn Koskela: [name], [name] Treating [name]/Extender: [name] in Treatment: 30 Compression Therapy Performed for Wound Assessment: Wound #17 Left Lower Leg Performed By: Clinician Dawn Poag, RN Compression Type: Three Layer Post Procedure Diagnosis Same as Pre-procedure Electronic Signature(s) Signed: 01/28/2021 4:33:42 PM By: [name] Entered By: [name] on 01/28/2021 16:08:34 [name] ([id number]) -------------------------------------------------------------------------------- Encounter  Discharge Information Details Patient Name: [name] Date of Service: 01/28/2021 3:15 PM Medical Record Number: [id number] Patient Account Number: [id number] Date of Birth/Sex: [birth year] (70 y.o. F) Treating RN: [name] Primary Care Jovana Rembold: [name], [name] Other Clinician: Referring Shatoya Roets: [name], [name] Treating Jessyca Sloan/Extender: [name] in Treatment: 30 Encounter Discharge Information Items Post Procedure Vitals Discharge Condition: Stable Temperature (F): 98.0 Ambulatory Status: Wheelchair Pulse (bpm): 55 Discharge Destination: Home Respiratory Rate (breaths/min): 20 Transportation: Private Auto Blood Pressure (mmHg): 125/57 Accompanied By: husband Schedule Follow-up Appointment: Yes Clinical Summary of Care: Electronic Signature(s) Signed: 01/28/2021 4:33:42 PM By: [name] Entered By: [name] on 01/28/2021 16:28:38 [name] ([id number]) -------------------------------------------------------------------------------- Lower Extremity Assessment Details Patient Name: [name] Date of Service: 01/28/2021 3:15 PM Medical Record Number: [id number] Patient Account Number: [id number] Date of Birth/Sex: 20-Sep-1950 (70 y.o. F) Treating RN: [name] Primary Care Latandra Loureiro: [name], [name] Other Clinician: Referring Lucila Klecka: [name], [name] Treating Keishawna Carranza/Extender: [name] in Treatment: 30 Edema Assessment Assessed: [Left: Yes] [Right: Yes] Edema: [Left: Yes] [Right: Yes] Calf Left: Right: Point of Measurement: 31 cm From Medial Instep 34 cm 35 cm Ankle Left: Right: Point of Measurement: 11 cm From Medial Instep 17 cm 17.3 cm Knee To Floor Left: Right: From Medial Instep 40 cm 40 cm Vascular Assessment Pulses: Dorsalis Pedis Palpable: [Left:Yes] [Right:Yes] Electronic Signature(s) Signed: 01/28/2021 4:33:42 PM By: [name] Entered By: [name] on 01/28/2021  15:53:42 [name], [name] ([id number]) -------------------------------------------------------------------------------- Multi Wound Chart Details Patient Name: [name] Date of Service: 01/28/2021 3:15 PM Medical Record Number: [id number] Patient Account Number: [id number] Date of Birth/Sex: May 30, 1950 (70 y.o. F) Treating RN: [name] Primary Care Seaver Machia: [name], [name] Other Clinician: Referring Maddalyn Lutze: [name], [name] Treating Degan Hanser/Extender: [name] in Treatment: 30 Vital Signs Height(in): 65.5 Pulse(bpm): 55 Weight(lbs): 240 Blood Pressure(mmHg): 125/57 Body Mass Index(BMI): 39 Temperature(F): 98.0 Respiratory Rate(breaths/min): 20 Photos: Wound Location: Right, Midline Lower Leg Left, Distal Toe Fourth Left Lower Leg Wounding Event: Gradually Appeared Pressure Injury Blister Primary Etiology: Venous Leg Ulcer Diabetic Wound/Ulcer of the Lower Venous Leg Ulcer Extremity Comorbid History: Asthma, Pneumothorax, Asthma, Pneumothorax, Asthma, Pneumothorax, Arrhythmia, Hypertension, Type II Arrhythmia, Hypertension, Type II Arrhythmia, Hypertension, Type II Diabetes, Gout, Osteoarthritis, Diabetes, Gout, Osteoarthritis, Diabetes, Gout, Osteoarthritis, Neuropathy Neuropathy Neuropathy Date Acquired:  10/21/2020 10/31/2020 12/13/2020 Weeks of Treatment: '12 8 6 ' Wound Status: Open Open Open Measurements L x W x D (cm) 1.1x1.7x0.1 0.4x0.5x0.1 1.4x0.8x0.1 Area (cm) : 1.469 0.157 0.88 Volume (cm) : 0.147 0.016 0.088 % Reduction in Area: 68.60% 80.00% -220.00% % Reduction in Volume: 68.50% 79.70% -225.90% Classification: Full Thickness Without Exposed Grade 2 Full Thickness Without Exposed Support Structures Support Structures Exudate Amount: Medium Medium Medium Exudate Type: Serosanguineous Serosanguineous Serosanguineous Exudate Color: red, brown red, brown red, brown Wound Margin: Flat and Intact N/A N/A Granulation Amount: Large  (67-100%) None Present (0%) Large (67-100%) Granulation Quality: Red, Pink N/A Red, Pink Necrotic Amount: Small (1-33%) Large (67-100%) Small (1-33%) Necrotic Tissue: Adherent [place] Exposed Structures: Fat Layer (Subcutaneous Tissue): Fat Layer (Subcutaneous Tissue): Fat Layer (Subcutaneous Tissue): Yes Yes Yes Fascia: No Bone: Yes Fascia: No Tendon: No Fascia: No Tendon: No Muscle: No Tendon: No Muscle: No Joint: No Muscle: No Joint: No Bone: No Joint: No Bone: No Epithelialization: None N/A N/A Treatment Notes Electronic Signature(s) Signed: 01/28/2021 4:33:42 PM By: [name] Entered By: [name] on 01/28/2021 15:54:14 [name], [name] ([id number]) BANWART, [name] ([id number]) -------------------------------------------------------------------------------- [place] Details Patient Name: [name] Date of Service: 01/28/2021 3:15 PM Medical Record Number: [id number] Patient Account Number: [id number] Date of Birth/Sex: [birth year] (70 y.o. F) Treating RN: [name] Primary Care Charnette Younkin: [name], [name] Other Clinician: Referring Cedricka Sackrider: [name], [name] Treating Brentyn Seehafer/Extender: [name] in Treatment: 30 Active Inactive Wound/Skin Impairment Nursing Diagnoses: Knowledge deficit related to ulceration/compromised skin integrity Goals: Patient/caregiver will verbalize understanding of skin care regimen Date Initiated: 06/28/2020 Date Inactivated: 08/16/2020 Target Resolution Date: 07/28/2020 Goal Status: Met Ulcer/skin breakdown will have a volume reduction of 30% by week 4 Date Initiated: 06/28/2020 Date Inactivated: 08/16/2020 Target Resolution Date: 07/28/2020 Goal Status: Met Ulcer/skin breakdown will have a volume reduction of 50% by week 8 Date Initiated: 06/28/2020 Date Inactivated: 09/13/2020 Target Resolution Date: 08/28/2020 Goal Status: Met Ulcer/skin breakdown will have a volume  reduction of 80% by week 12 Date Initiated: 06/28/2020 Date Inactivated: 01/14/2021 Target Resolution Date: 09/27/2020 Unmet Reason: missed appt/non Goal Status: Unmet compliant Ulcer/skin breakdown will heal within 14 weeks Date Initiated: 06/28/2020 Target Resolution Date: 10/28/2020 Goal Status: Active Interventions: Assess patient/caregiver ability to obtain necessary supplies Assess patient/caregiver ability to perform ulcer/skin care regimen upon admission and as needed Assess ulceration(s) every visit Notes: Electronic Signature(s) Signed: 01/28/2021 4:33:42 PM By: [name] Entered By: [name] on 01/28/2021 15:53:54 [name], [name] ([id number]) -------------------------------------------------------------------------------- Pain Assessment Details Patient Name: [name] Date of Service: 01/28/2021 3:15 PM Medical Record Number: [id number] Patient Account Number: [id number] Date of Birth/Sex: 1950/09/01 (70 y.o. F) Treating RN: [name] Primary Care Bryann Gentz: [name], [name] Other Clinician: Referring Barabara Motz: [name], [name] Treating Lydian Chavous/Extender: [name] in Treatment: 30 Active Problems Location of Pain Severity and Description of Pain Patient Has Paino Yes Site Locations Pain Location: Generalized Pain Rate the pain. Current Pain Level: 7 Pain Management and Medication Current Pain Management: Notes stated L shoulder pain Electronic Signature(s) Signed: 01/28/2021 4:33:42 PM By: [name] Entered By: [name] on 01/28/2021 15:42:26 [name], [name] ([id number]) -------------------------------------------------------------------------------- Patient/Caregiver Education Details Patient Name: [name] Date of Service: 01/28/2021 3:15 PM Medical Record Number: [id number] Patient Account Number: [id number] Date of Birth/Gender: February 22, 1951 (70 y.o. F) Treating RN: [name] Primary Care Physician:  [name], [name] Other Clinician: Referring Physician: [name], [name] Treating Physician/Extender: [name] in Treatment: 92 Education Assessment  Education Provided To: Patient Education Topics Provided Basic Hygiene: Infection: Nutrition: Venous: Wound Debridement: Wound/Skin Impairment: Electronic Signature(s) Signed: 01/28/2021 4:33:42 PM By: [name] Entered By: [name] on 01/28/2021 16:11:34 [name], [name] ([id number]) -------------------------------------------------------------------------------- Wound Assessment Details Patient Name: [name] Date of Service: 01/28/2021 3:15 PM Medical Record Number: [id number] Patient Account Number: [id number] Date of Birth/Sex: [birth year] (70 y.o. F) Treating RN: [name] Primary Care Yostin Malacara: [name], [name] Other Clinician: Referring Isma Tietje: [name], [name] Treating Abie Cheek/Extender: [name] in Treatment: 30 Wound Status Wound Number: 13 Primary Venous Leg Ulcer Etiology: Wound Location: Right, Midline Lower Leg Wound Open Wounding Event: Gradually Appeared Status: Date Acquired: 10/21/2020 Comorbid Asthma, Pneumothorax, Arrhythmia, Hypertension, Type Weeks Of Treatment: 12 History: II Diabetes, Gout, Osteoarthritis, Neuropathy Clustered Wound: No Photos Wound Measurements Length: (cm) 1.1 Width: (cm) 1.7 Depth: (cm) 0.1 Area: (cm) 1.469 Volume: (cm) 0.147 % Reduction in Area: 68.6% % Reduction in Volume: 68.5% Epithelialization: None Tunneling: No Undermining: No Wound Description Classification: Full Thickness Without Exposed Support Structu Wound Margin: Flat and Intact Exudate Amount: Medium Exudate Type: Serosanguineous Exudate Color: red, brown res Foul Odor After Cleansing: No Slough/Fibrino Yes Wound Bed Granulation Amount: Large (67-100%) Exposed Structure Granulation Quality: Red, Pink Fascia Exposed: No Necrotic Amount: Small  (1-33%) Fat Layer (Subcutaneous Tissue) Exposed: Yes Necrotic Quality: Adherent Slough Tendon Exposed: No Muscle Exposed: No Joint Exposed: No Bone Exposed: No Treatment Notes Wound #13 (Lower Leg) Wound Laterality: Right, Midline Cleanser Soap and Water Discharge Instruction: Gently cleanse wound with antibacterial soap, rinse and pat dry prior to dressing wounds Wound Cleanser Fritcher, Elyn B. ([id number]) Discharge Instruction: Wash your hands with soap and water. Remove old dressing, discard into plastic bag and place into trash. Cleanse the wound with Wound Cleanser prior to applying a clean dressing using gauze sponges, not tissues or cotton balls. Do not scrub or use excessive force. Pat dry using gauze sponges, not tissue or cotton balls. Peri-Wound Care Topical Primary Dressing Hydrofera Blue Ready Transfer Foam, 2.5x2.5 (in/in) Discharge Instruction: Apply Hydrofera Blue Ready to wound bed as directed Secondary Dressing ABD Pad 5x9 (in/in) Discharge Instruction: Cover with ABD pad Secured With Compression Wrap Profore Lite LF 3 Multilayer Compression [name] Discharge Instruction: Apply 3 multi-layer wrap as prescribed. Compression Stockings Add-Ons Electronic Signature(s) Signed: 01/28/2021 4:33:42 PM By: [name] Entered By: [name] on 01/28/2021 15:48:20 [name] ([id number]) -------------------------------------------------------------------------------- Wound Assessment Details Patient Name: [name] Date of Service: 01/28/2021 3:15 PM Medical Record Number: [id number] Patient Account Number: [id number] Date of Birth/Sex: [birth year] (70 y.o. F) Treating RN: [name] Primary Care Jarmal Lewelling: [name], [name] Other Clinician: Referring Evelene Roussin: [name], [name] Treating Dodger Sinning/Extender: [name] in Treatment: 30 Wound Status Wound Number: 16 Primary Diabetic Wound/Ulcer of the Lower  Extremity Etiology: Wound Location: Left, Distal Toe Fourth Wound Open Wounding Event: Pressure Injury Status: Date Acquired: 10/31/2020 Notes: stated toenail was removed by podiatry doctor on Weeks Of Treatment: 8 11/08/20 Clustered Wound: No Comorbid Asthma, Pneumothorax, Arrhythmia, Hypertension, Type History: II Diabetes, Gout, Osteoarthritis, Neuropathy Photos Wound Measurements Length: (cm) 0.4 Width: (cm) 0.5 Depth: (cm) 0.1 Area: (cm) 0.157 Volume: (cm) 0.016 % Reduction in Area: 80% % Reduction in Volume: 79.7% Tunneling: No Undermining: No Wound Description Classification: Grade 2 Exudate Amount: Medium Exudate Type: Serosanguineous Exudate Color: red, brown Foul Odor After Cleansing: No Slough/Fibrino Yes Wound Bed Granulation Amount: None Present (0%) Exposed Structure Necrotic Amount: Large (67-100%) Fascia Exposed: No Necrotic Quality: Eschar Fat Layer (  Subcutaneous Tissue) Exposed: Yes Tendon Exposed: No Muscle Exposed: No Joint Exposed: No Bone Exposed: Yes Treatment Notes Wound #16 (Toe Fourth) Wound Laterality: Left, Distal Cleanser Soap and Water Discharge Instruction: Gently cleanse wound with antibacterial soap, rinse and pat dry prior to dressing wounds Wound Cleanser Asby, Terah B. ([id number]) Discharge Instruction: Wash your hands with soap and water. Remove old dressing, discard into plastic bag and place into trash. Cleanse the wound with Wound Cleanser prior to applying a clean dressing using gauze sponges, not tissues or cotton balls. Do not scrub or use excessive force. Pat dry using gauze sponges, not tissue or cotton balls. Peri-Wound Care Topical Primary Dressing Hydrofera Blue Ready Transfer Foam, 2.5x2.5 (in/in) Discharge Instruction: Apply Hydrofera Blue Ready to wound bed as directed Secondary Dressing ABD Pad 5x9 (in/in) Discharge Instruction: Cover with ABD pad or cut in half Secured With Dexter  Surgical Tape, 2x2 (in/yd) Kerlix Roll Sterile or Non-Sterile 6-ply 4.5x4 (yd/yd) Discharge Instruction: Apply Kerlix as directed Compression Wrap Compression Stockings Add-Ons Electronic Signature(s) Signed: 01/28/2021 4:33:42 PM By: [name] Entered By: [name] on 01/28/2021 15:50:05 [name] ([id number]) -------------------------------------------------------------------------------- Wound Assessment Details Patient Name: [name] Date of Service: 01/28/2021 3:15 PM Medical Record Number: [id number] Patient Account Number: [id number] Date of Birth/Sex: [birth year] (70 y.o. F) Treating RN: [name] Primary Care Sandford Diop: [name], [name] Other Clinician: Referring Lankford Gutzmer: [name], [name] Treating Brittony Billick/Extender: [name] in Treatment: 30 Wound Status Wound Number: 17 Primary Venous Leg Ulcer Etiology: Wound Location: Left Lower Leg Wound Open Wounding Event: Blister Status: Date Acquired: 12/13/2020 Comorbid Asthma, Pneumothorax, Arrhythmia, Hypertension, Type Weeks Of Treatment: 6 History: II Diabetes, Gout, Osteoarthritis, Neuropathy Clustered Wound: No Photos Wound Measurements Length: (cm) 1.4 Width: (cm) 0.8 Depth: (cm) 0.1 Area: (cm) 0.88 Volume: (cm) 0.088 % Reduction in Area: -220% % Reduction in Volume: -225.9% Tunneling: No Undermining: No Wound Description Classification: Full Thickness Without Exposed Support Structures Exudate Amount: Medium Exudate Type: Serosanguineous Exudate Color: red, brown Foul Odor After Cleansing: No Slough/Fibrino Yes Wound Bed Granulation Amount: Large (67-100%) Exposed Structure Granulation Quality: Red, Pink Fascia Exposed: No Necrotic Amount: Small (1-33%) Fat Layer (Subcutaneous Tissue) Exposed: Yes Necrotic Quality: Adherent Slough Tendon Exposed: No Muscle Exposed: No Joint Exposed: No Bone Exposed: No Treatment Notes Wound #17 (Lower Leg) Wound Laterality:  Left Cleanser Normal Saline Discharge Instruction: Wash your hands with soap and water. Remove old dressing, discard into plastic bag and place into trash. Cleanse the wound with Normal Saline prior to applying a clean dressing using gauze sponges, not tissues or cotton balls. Do not scrub or use excessive force. Pat dry using gauze sponges, not tissue or cotton balls. [name], [name] ([id number]) Peri-Wound Care Topical Primary Dressing Hydrofera Blue Ready Transfer Foam, 2.5x2.5 (in/in) Discharge Instruction: Apply Hydrofera Blue Ready to wound bed as directed Secondary Dressing ABD Pad 5x9 (in/in) Discharge Instruction: Cover with ABD pad Secured With Compression Wrap Profore Lite LF 3 Multilayer Compression [place] Discharge Instruction: Apply 3 multi-layer wrap as prescribed. Compression Stockings Add-Ons Electronic Signature(s) Signed: 01/28/2021 4:33:42 PM By: [name] Entered By: [name] on 01/28/2021 15:51:52 [name] ([id number]) -------------------------------------------------------------------------------- Vitals Details Patient Name: [name] Date of Service: 01/28/2021 3:15 PM Medical Record Number: [id number] Patient Account Number: [id number] Date of Birth/Sex: 14-Oct-1950 (70 y.o. F) Treating RN: [name] Primary Care Gleen Ripberger: [name], [name] Other Clinician: Referring Ashland Wiseman: [name], [name] Treating Berlynn Warsame/Extender: [name] in Treatment: 30 Vital Signs  Time Taken: 15:38 Temperature (F): 98.0 Height (in): 65.5 Pulse (bpm): 55 Weight (lbs): 240 Respiratory Rate (breaths/min): 20 Body Mass Index (BMI): 39.3 Blood Pressure (mmHg): 125/57 Reference Range: 80 - 120 mg / dl Electronic Signature(s) Signed: 01/28/2021 4:33:42 PM By: [name] Entered ByDonnamarie [name] on 01/28/2021 15:41:28

## 2021-01-28 NOTE — Progress Notes (Addendum)
CRISSA, SOWDER ([id number]) Visit Report for 01/28/2021 Chief Complaint Document Details Patient Name: [name], [name] Date of Service: 01/28/2021 3:15 PM Medical Record Number: [id number] Patient Account Number: [id number] Date of Birth/Sex: 1950-04-28 (70 y.o. F) Treating RN: Donnamarie Poag Primary Care Provider: [name], Olivia Mackie Other Clinician: Referring Provider: [name], Olivia Mackie Treating Provider/Extender: Skipper Cliche in Treatment: 30 Information Obtained from: Patient Chief Complaint Right Leg Ulcer and Right 4thToe Ulcer Electronic Signature(s) Signed: 01/28/2021 3:35:09 PM By: Worthy Keeler PA-C Entered By: Worthy Keeler on 01/28/2021 15:35:09 [name] ([id number]) -------------------------------------------------------------------------------- Debridement Details Patient Name: [name] Date of Service: 01/28/2021 3:15 PM Medical Record Number: [id number] Patient Account Number: [id number] Date of Birth/Sex: Aug 01, 1950 (70 y.o. F) Treating RN: Donnamarie Poag Primary Care Provider: [name], Olivia Mackie Other Clinician: Referring Provider: [name], Olivia Mackie Treating Provider/Extender: Skipper Cliche in Treatment: 30 Debridement Performed for Wound #16 Left,Distal Toe Fourth Assessment: Performed By: Physician Tommie Sams., PA-C Debridement Type: Debridement Severity of Tissue Pre Debridement: Bone involvement without necrosis Level of Consciousness (Pre- Awake and Alert procedure): Pre-procedure Verification/Time Out Yes - 16:03 Taken: Start Time: 16:03 Pain Control: Lidocaine Total Area Debrided (L x W): 0.4 (cm) x 0.5 (cm) = 0.2 (cm) Tissue and other material Viable, Non-Viable, Bone, Slough, Subcutaneous, Skin: Dermis , Slough debrided: Level: Skin/Subcutaneous Tissue/Muscle/Bone Debridement Description: Excisional Instrument: Curette Bleeding: Minimum Hemostasis Achieved: Pressure Response to Treatment:  Procedure was tolerated well Level of Consciousness (Post- Awake and Alert procedure): Post Debridement Measurements of Total Wound Length: (cm) 0.4 Width: (cm) 0.5 Depth: (cm) 0.2 Volume: (cm) 0.031 Character of Wound/Ulcer Post Debridement: Improved Severity of Tissue Post Debridement: Bone involvement without necrosis Post Procedure Diagnosis Same as Pre-procedure Electronic Signature(s) Signed: 01/28/2021 4:33:42 PM By: Donnamarie Poag Signed: 01/28/2021 6:00:47 PM By: Worthy Keeler PA-C Entered By: Donnamarie Poag on 01/28/2021 16:06:21 [name] ([id number]) -------------------------------------------------------------------------------- HPI Details Patient Name: [name] Date of Service: 01/28/2021 3:15 PM Medical Record Number: [id number] Patient Account Number: [id number] Date of Birth/Sex: 12-12-1950 (70 y.o. F) Treating RN: Donnamarie Poag Primary Care Provider: [name], Olivia Mackie Other Clinician: Referring Provider: [name], Olivia Mackie Treating Provider/Extender: Skipper Cliche in Treatment: 30 History of Present Illness HPI Description: 06/28/20 upon evaluation today patient appears to be doing unfortunately somewhat poorly in regard to wounds that she has over her lower extremities. She has a wound on the left distal second toe, left posterior heel, and right anterior lower leg. With that being said all in all none of the wounds appear to be too bad the one that hurts her the most is actually the wound on the posterior heel which actually is also one of the smallest. Nonetheless I think there is some callus hiding what is really going on in this area. Fortunately there does not appear to be any obvious evidence of infection which is great news. Patient does have a history of diabetes mellitus type 2, chronic venous insufficiency, hypertension, history of DVTs, and is on long-term anticoagulant therapy, Eliquis. 07/05/20 upon evaluation today patient appears  to be doing well currently with regard to her wounds. I feel like she is making progress which is great news and overall there is no signs of active infection at this time. No fevers, chills, nausea, vomiting, or diarrhea. 4/29; patient has a only a small area remaining on the tip of her left second toe and a small area remaining on the left heel. The area on the right anterior lower leg is healed.  They tell me that she had remote podiatric surgery on the toes of the left foot however they are very fixed in position and I wonder whether they are making it difficult to relieve pressure in her foot wear. She is a type II diabetic with neuropathy as well. 08/01/2020 upon evaluation today patient appears to be doing well currently in regard to her wounds. In fact everything appears to be doing much better. The posterior aspect of her heel is actually giving her some trouble not the Achilles area but a small wound that we did not even have marked. In fact I had noted this before. Nonetheless we are going to addressed that today. 5/27; most of the patient's wounds are healed including the left Achilles heel left toe. Right forearm is also healed. She has a new abrasion posteriorly in the right elbow area just above the olecranon this happened today with an abrasion from her car door 6/15; the patient's wounds on the left Achilles and left second toe remain healed. The area on the right forearm is also just about healed in fact the skin I replaced last time is looking viable which is good. She had me look at the left heel again which is not in the area of the wound more towards the tip of the heel at the Achilles insertion site as well as the tip of the left foot second toe. In the Achilles area that is clearly is friction probably in bed at night. Not really near where the wound was. The left second toe remains healed although this is hammered and overhanging first toe also puts pressure in this  area 09/13/2020 upon evaluation today patient appears to be doing well with regard to all previous wounds that she had. Everything is completely healed. With that being said I do think that the patient unfortunately has a new skin tear on the right forearm which is due to her put a Band-Aid on it and then when it pulled off this caused some issues with skin tearing. Nonetheless I think that this needs to be addressed today. Hopefully will take too long to get this to heal. She is having some issues with pain in her heel but I think this is more related to be honest to her neuropathy as opposed to anything else. Readmission: 10/30/2020 upon evaluation today patient appears to be doing poorly in regard to her legs bilaterally. She is also having an issue with her fourth toe on the left. Fortunately there does not appear to be any signs of active infection at this time. No fevers, chills, nausea, vomiting, or diarrhea. It is actually been quite a while since we have seen her. In fact June 24 was the last time that she was here in the clinic. She tells me she did not come back because things were just doing "pretty well". 11/29/2020 upon evaluation today patient actually appears to be doing well with regard to her arms those are completely healed. Her right leg is still open but doing okay. The fourth toe of the left foot does show some signs here of having some issues with necrotic tissue of an open wound. This is where a toenail was removed by podiatry. Subsequently I do think that we need to go ahead and see what we can do about getting this cleared away so being try to get some healing going forward. 12/13/2020 upon evaluation today patient appears to be doing decently well in regard to her wound on the  right leg. In general I think that she is making good progress here. Unfortunately she continues to have significant issues with swelling in the left leg is now even more swollen at this point. For that  reason I do believe she would be benefited by wrapping both sides although she is not interested in doing this. Overall I think that she is definitely making some good progress here nonetheless in regard to the right leg left leg leave some to be desired. 12/27/2020 upon evaluation today patient appears to be doing well with regard to her wound in regard to the legs. Unfortunately the toe was not doing nearly as well. I am much more concerned about infection and osteomyelitis here. She had the MRI but right now were not seeing obvious evidence of redness spreading up to her foot this is good news I do not have the result of the MRI as of yet. I am good to place her on doxycycline however due to the fact that the wound does seem to be getting a little worse in regard to her toe. 01/14/2021 upon evaluation today patient appears to be doing well with regard to her ulcers. The right leg is doing well and even the toe seems to be doing well though she still has some evidence of bone exposed on the toe which does not appear to be doing quite as good as I would like to see just and the fact that is still not covered over new tissue but looks tremendously better compared to 2 weeks ago. Overall very pleased in that regard. 01/28/2021 upon evaluation today patient's leg on the left actually appears to have new wounds this is due to her deciding to shave yesterday. Fortunately there does not appear to be any signs of infection currently. This is good news. Nonetheless I do believe that with these new areas open it would be best to wrap her leg to try to make sure we get this under control sooner rather than later. With regard to her toe there still does appear to be some evidence of bone exposed this was covered over with callus I did remove that today. Nonetheless I think this is significantly smaller than previous. [name], [name] ([id number]) Electronic Signature(s) Signed: 01/28/2021 4:54:51 PM By: Worthy Keeler PA-C Entered By: Worthy Keeler on 01/28/2021 16:54:51 [name], [name] ([id number]) -------------------------------------------------------------------------------- Physical Exam Details Patient Name: [name] Date of Service: 01/28/2021 3:15 PM Medical Record Number: [id number] Patient Account Number: [id number] Date of Birth/Sex: Feb 13, 1951 (70 y.o. F) Treating RN: Donnamarie Poag Primary Care Provider: [name], Olivia Mackie Other Clinician: Referring Provider: [name], Olivia Mackie Treating Provider/Extender: Skipper Cliche in Treatment: 42 Constitutional Well-nourished and well-hydrated in no acute distress. Respiratory normal breathing without difficulty. Psychiatric this patient is able to make decisions and demonstrates good insight into disease process. Alert and Oriented x 3. pleasant and cooperative. Notes Patient's wound bed actually showed signs of good granulation and epithelization at this point pretty much across the board everything is doing quite well. I do think continuing with the compression wrapping would be good and we probably need to wrap both legs to be honest. Electronic Signature(s) Signed: 01/28/2021 4:55:09 PM By: Worthy Keeler PA-C Entered By: Worthy Keeler on 01/28/2021 16:55:09 [name], [name] ([id number]) -------------------------------------------------------------------------------- Physician Orders Details Patient Name: [name] Date of Service: 01/28/2021 3:15 PM Medical Record Number: [id number] Patient Account Number: [id number] Date of Birth/Sex: 1950/05/11 (70 y.o. F) Treating RN: Donnamarie Poag  Primary Care Provider: [name], Olivia Mackie Other Clinician: Referring Provider: [name], Olivia Mackie Treating Provider/Extender: Skipper Cliche in Treatment: 38 Verbal / Phone Orders: No Diagnosis Coding ICD-10 Coding Code Description E11.621 Type 2 diabetes mellitus with foot ulcer I87.2 Venous insufficiency  (chronic) (peripheral) Z79.01 Long term (current) use of anticoagulants L97.812 Non-pressure chronic ulcer of other part of right lower leg with fat layer exposed L97.522 Non-pressure chronic ulcer of other part of left foot with fat layer exposed Follow-up Appointments o Return Appointment in 2 weeks. o Nurse Visit as needed [facility] [address] [place]: - [place] for wound care. May utilize formulary equivalent dressing for wound treatment orders unless otherwise specified. Home Health Nurse may visit PRN to address patientos wound care needs. o Scheduled days for dressing changes to be completed; exception, patient has scheduled wound care visit that day. o **Please direct any NON-WOUND related issues/requests for orders to patient's Primary Care Physician. **If current dressing causes regression in wound condition, may D/C ordered dressing product/s and apply Normal Saline Moist Dressing daily until next [place] or Other MD appointment. **Notify [facility] of regression in wound condition at [phone]. Bathing/ Shower/ Hygiene o May shower with wound dressing protected with water repellent cover or cast protector. o No tub bath. Edema Control - Lymphedema / Segmental Compressive Device / Other o Patient to wear own compression stockings. Remove compression stockings every night before going to bed and put on every morning when getting up. - WHEN NOT USING A COMPRESSION WRAP, USE COMPRESSION HOSE o Elevate, Exercise Daily and Avoid Standing for Long Periods of Time. o Elevate legs to the level of the heart and pump ankles as often as possible o Elevate leg(s) parallel to the floor when sitting. o DO YOUR BEST to sleep in the bed at night. DO NOT sleep in your recliner. Long hours of sitting in a recliner leads to swelling of the legs and/or potential wounds on your  backside. Off-Loading Wound #16 Left,Distal Toe Fourth o Open toe surgical shoe - keep pressure off wound/toes o Other: - suggest float heels when in bed with a pillow to keep pressure off of heels; choose shoewear for right foot to not rub foot Additional Orders / Instructions o Follow Nutritious Diet and Increase Protein Intake Medications-Please add to medication list. o P.O. Antibiotics - Finish Antibiotics as prescribed Wound Treatment Wound #13 - Lower Leg Wound Laterality: Right, Midline Cleanser: Soap and Water 3 x Per Week/30 Days Discharge Instructions: Gently cleanse wound with antibacterial soap, rinse and pat dry prior to dressing wounds Cleanser: Wound Cleanser 3 x Per Week/30 Days JESUS, POPLIN B. ([id number]) Discharge Instructions: Wash your hands with soap and water. Remove old dressing, discard into plastic bag and place into trash. Cleanse the wound with Wound Cleanser prior to applying a clean dressing using gauze sponges, not tissues or cotton balls. Do not scrub or use excessive force. Pat dry using gauze sponges, not tissue or cotton balls. Primary Dressing: Hydrofera Blue Ready Transfer Foam, 2.5x2.5 (in/in) (Home Health) 3 x Per Week/30 Days Discharge Instructions: Apply Hydrofera Blue Ready to wound bed as directed Secondary Dressing: ABD Pad 5x9 (in/in) (Home Health) 3 x Per Week/30 Days Discharge Instructions: Cover with ABD pad Compression Wrap: Profore Lite LF 3 Multilayer Compression Bandaging System (Home Health) 3 x Per Week/30 Days Discharge Instructions: Apply 3 multi-layer wrap as prescribed. Wound #16 - Toe Fourth Wound Laterality:  Left, Distal Cleanser: Soap and Water Every Other Day/30 Days Discharge Instructions: Gently cleanse wound with antibacterial soap, rinse and pat dry prior to dressing wounds Cleanser: Wound Cleanser Every Other Day/30 Days Discharge Instructions: Wash your hands with soap and water. Remove old dressing, discard  into plastic bag and place into trash. Cleanse the wound with Wound Cleanser prior to applying a clean dressing using gauze sponges, not tissues or cotton balls. Do not scrub or use excessive force. Pat dry using gauze sponges, not tissue or cotton balls. Primary Dressing: Hydrofera Blue Ready Transfer Foam, 2.5x2.5 (in/in) (Home Health) Every Other Day/30 Days Discharge Instructions: Apply Hydrofera Blue Ready to wound bed as directed Secondary Dressing: ABD Pad 5x9 (in/in) Every Other Day/30 Days Discharge Instructions: Cover with ABD pad or cut in half Secured With: 34M Medipore H Soft Cloth Surgical Tape, 2x2 (in/yd) Every Other Day/30 Days Secured With: [employer] Sterile or Non-Sterile 6-ply 4.5x4 (yd/yd) Every Other Day/30 Days Discharge Instructions: Apply Kerlix as directed Wound #17 - Lower Leg Wound Laterality: Left Cleanser: Normal Saline 3 x Per Week/30 Days Discharge Instructions: Wash your hands with soap and water. Remove old dressing, discard into plastic bag and place into trash. Cleanse the wound with Normal Saline prior to applying a clean dressing using gauze sponges, not tissues or cotton balls. Do not scrub or use excessive force. Pat dry using gauze sponges, not tissue or cotton balls. Primary Dressing: Hydrofera Blue Ready Transfer Foam, 2.5x2.5 (in/in) 3 x Per Week/30 Days Discharge Instructions: Apply Hydrofera Blue Ready to wound bed as directed Secondary Dressing: ABD Pad 5x9 (in/in) 3 x Per Week/30 Days Discharge Instructions: Cover with ABD pad Compression Wrap: Profore Lite LF 3 Multilayer Compression Bandaging System 3 x Per Week/30 Days Discharge Instructions: Apply 3 multi-layer wrap as prescribed. Electronic Signature(s) Signed: 01/28/2021 4:33:42 PM By: Donnamarie Poag Signed: 01/28/2021 6:00:47 PM By: Worthy Keeler PA-C Entered By: Donnamarie Poag on 01/28/2021 16:10:46 [name], [name]  ([id number]) -------------------------------------------------------------------------------- Problem List Details Patient Name: [name] Date of Service: 01/28/2021 3:15 PM Medical Record Number: [id number] Patient Account Number: [id number] Date of Birth/Sex: 04/25/1950 (70 y.o. F) Treating RN: Donnamarie Poag Primary Care Provider: [name], Olivia Mackie Other Clinician: Referring Provider: [name], Olivia Mackie Treating Provider/Extender: Skipper Cliche in Treatment: 30 Active Problems ICD-10 Encounter Code Description Active Date MDM Diagnosis E11.621 Type 2 diabetes mellitus with foot ulcer 06/28/2020 No Yes I87.2 Venous insufficiency (chronic) (peripheral) 06/28/2020 No Yes Z79.01 Long term (current) use of anticoagulants 06/28/2020 No Yes L97.812 Non-pressure chronic ulcer of other part of right lower leg with fat layer 06/28/2020 No Yes exposed L97.522 Non-pressure chronic ulcer of other part of left foot with fat layer 06/28/2020 No Yes exposed Inactive Problems ICD-10 Code Description Active Date Inactive Date I10 Essential (primary) hypertension 06/28/2020 06/28/2020 Z86.718 Personal history of other venous thrombosis and embolism 06/28/2020 06/28/2020 Resolved Problems ICD-10 Code Description Active Date Resolved Date L97.322 Non-pressure chronic ulcer of left ankle with fat layer exposed 06/28/2020 06/28/2020 S50.811A Abrasion of right forearm, initial encounter 10/30/2020 10/30/2020 S50.311D Abrasion of right elbow, subsequent encounter 08/16/2020 08/16/2020 Electronic Signature(s) [name], [name] ([id number]) Signed: 01/28/2021 3:34:58 PM By: Worthy Keeler PA-C Entered By: Worthy Keeler on 01/28/2021 15:34:57 Grawe, [name] ([id number]) -------------------------------------------------------------------------------- Progress Note Details Patient Name: [name] Date of Service: 01/28/2021 3:15 PM Medical Record Number: [id number] Patient Account Number:  [id number] Date of Birth/Sex: 11-02-1950 (70 y.o. F) Treating RN: Donnamarie Poag Primary Care Provider: [name], Olivia Mackie  Other Clinician: Referring Provider: [name], Olivia Mackie Treating Provider/Extender: Skipper Cliche in Treatment: 30 Subjective Chief Complaint Information obtained from Patient Right Leg Ulcer and Right 4thToe Ulcer History of Present Illness (HPI) 06/28/20 upon evaluation today patient appears to be doing unfortunately somewhat poorly in regard to wounds that she has over her lower extremities. She has a wound on the left distal second toe, left posterior heel, and right anterior lower leg. With that being said all in all none of the wounds appear to be too bad the one that hurts her the most is actually the wound on the posterior heel which actually is also one of the smallest. Nonetheless I think there is some callus hiding what is really going on in this area. Fortunately there does not appear to be any obvious evidence of infection which is great news. Patient does have a history of diabetes mellitus type 2, chronic venous insufficiency, hypertension, history of DVTs, and is on long-term anticoagulant therapy, Eliquis. 07/05/20 upon evaluation today patient appears to be doing well currently with regard to her wounds. I feel like she is making progress which is great news and overall there is no signs of active infection at this time. No fevers, chills, nausea, vomiting, or diarrhea. 4/29; patient has a only a small area remaining on the tip of her left second toe and a small area remaining on the left heel. The area on the right anterior lower leg is healed. They tell me that she had remote podiatric surgery on the toes of the left foot however they are very fixed in position and I wonder whether they are making it difficult to relieve pressure in her foot wear. She is a type II diabetic with neuropathy as well. 08/01/2020 upon evaluation today patient appears to  be doing well currently in regard to her wounds. In fact everything appears to be doing much better. The posterior aspect of her heel is actually giving her some trouble not the Achilles area but a small wound that we did not even have marked. In fact I had noted this before. Nonetheless we are going to addressed that today. 5/27; most of the patient's wounds are healed including the left Achilles heel left toe. Right forearm is also healed. She has a new abrasion posteriorly in the right elbow area just above the olecranon this happened today with an abrasion from her car door 6/15; the patient's wounds on the left Achilles and left second toe remain healed. The area on the right forearm is also just about healed in fact the skin I replaced last time is looking viable which is good. She had me look at the left heel again which is not in the area of the wound more towards the tip of the heel at the Achilles insertion site as well as the tip of the left foot second toe. In the Achilles area that is clearly is friction probably in bed at night. Not really near where the wound was. The left second toe remains healed although this is hammered and overhanging first toe also puts pressure in this area 09/13/2020 upon evaluation today patient appears to be doing well with regard to all previous wounds that she had. Everything is completely healed. With that being said I do think that the patient unfortunately has a new skin tear on the right forearm which is due to her put a Band-Aid on it and then when it pulled off this caused some issues with skin tearing.  Nonetheless I think that this needs to be addressed today. Hopefully will take too long to get this to heal. She is having some issues with pain in her heel but I think this is more related to be honest to her neuropathy as opposed to anything else. Readmission: 10/30/2020 upon evaluation today patient appears to be doing poorly in regard to her legs  bilaterally. She is also having an issue with her fourth toe on the left. Fortunately there does not appear to be any signs of active infection at this time. No fevers, chills, nausea, vomiting, or diarrhea. It is actually been quite a while since we have seen her. In fact June 24 was the last time that she was here in the clinic. She tells me she did not come back because things were just doing "pretty well". 11/29/2020 upon evaluation today patient actually appears to be doing well with regard to her arms those are completely healed. Her right leg is still open but doing okay. The fourth toe of the left foot does show some signs here of having some issues with necrotic tissue of an open wound. This is where a toenail was removed by podiatry. Subsequently I do think that we need to go ahead and see what we can do about getting this cleared away so being try to get some healing going forward. 12/13/2020 upon evaluation today patient appears to be doing decently well in regard to her wound on the right leg. In general I think that she is making good progress here. Unfortunately she continues to have significant issues with swelling in the left leg is now even more swollen at this point. For that reason I do believe she would be benefited by wrapping both sides although she is not interested in doing this. Overall I think that she is definitely making some good progress here nonetheless in regard to the right leg left leg leave some to be desired. 12/27/2020 upon evaluation today patient appears to be doing well with regard to her wound in regard to the legs. Unfortunately the toe was not doing nearly as well. I am much more concerned about infection and osteomyelitis here. She had the MRI but right now were not seeing obvious evidence of redness spreading up to her foot this is good news I do not have the result of the MRI as of yet. I am good to place her on doxycycline however due to the fact that the  wound does seem to be getting a little worse in regard to her toe. 01/14/2021 upon evaluation today patient appears to be doing well with regard to her ulcers. The right leg is doing well and even the toe seems to be doing well though she still has some evidence of bone exposed on the toe which does not appear to be doing quite as good as I would like to see just and the fact that is still not covered over new tissue but looks tremendously better compared to 2 weeks ago. Overall very pleased in that regard. 01/28/2021 upon evaluation today patient's leg on the left actually appears to have new wounds this is due to her deciding to shave yesterday. Fortunately there does not appear to be any signs of infection currently. This is good news. Nonetheless I do believe that with these new areas open it would be best to wrap her leg to try to make sure we get this under control sooner rather than later. With regard to her toe  there still does [name], Dawn B. ([id number]) appear to be some evidence of bone exposed this was covered over with callus I did remove that today. Nonetheless I think this is significantly smaller than previous. Objective Constitutional Well-nourished and well-hydrated in no acute distress. Vitals Time Taken: 3:38 PM, Height: 65.5 in, Weight: 240 lbs, BMI: 39.3, Temperature: 98.0 F, Pulse: 55 bpm, Respiratory Rate: 20 breaths/min, Blood Pressure: 125/57 mmHg. Respiratory normal breathing without difficulty. Psychiatric this patient is able to make decisions and demonstrates good insight into disease process. Alert and Oriented x 3. pleasant and cooperative. General Notes: Patient's wound bed actually showed signs of good granulation and epithelization at this point pretty much across the board everything is doing quite well. I do think continuing with the compression wrapping would be good and we probably need to wrap both legs to be honest. Integumentary (Hair, Skin) Wound  #13 status is Open. Original cause of wound was Gradually Appeared. The date acquired was: 10/21/2020. The wound has been in treatment 12 weeks. The wound is located on the Right,Midline Lower Leg. The wound measures 1.1cm length x 1.7cm width x 0.1cm depth; 1.469cm^2 area and 0.147cm^3 volume. There is Fat Layer (Subcutaneous Tissue) exposed. There is no tunneling or undermining noted. There is a medium amount of serosanguineous drainage noted. The wound margin is flat and intact. There is large (67-100%) red, pink granulation within the wound bed. There is a small (1-33%) amount of necrotic tissue within the wound bed including Adherent Slough. Wound #16 status is Open. Original cause of wound was Pressure Injury. The date acquired was: 10/31/2020. The wound has been in treatment 8 weeks. The wound is located on the Left,Distal Toe Fourth. The wound measures 0.4cm length x 0.5cm width x 0.1cm depth; 0.157cm^2 area and 0.016cm^3 volume. There is bone and Fat Layer (Subcutaneous Tissue) exposed. There is no tunneling or undermining noted. There is a medium amount of serosanguineous drainage noted. There is no granulation within the wound bed. There is a large (67-100%) amount of necrotic tissue within the wound bed including Eschar. Wound #17 status is Open. Original cause of wound was Blister. The date acquired was: 12/13/2020. The wound has been in treatment 6 weeks. The wound is located on the Left Lower Leg. The wound measures 1.4cm length x 0.8cm width x 0.1cm depth; 0.88cm^2 area and 0.088cm^3 volume. There is Fat Layer (Subcutaneous Tissue) exposed. There is no tunneling or undermining noted. There is a medium amount of serosanguineous drainage noted. There is large (67-100%) red, pink granulation within the wound bed. There is a small (1-33%) amount of necrotic tissue within the wound bed including Adherent Slough. 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 There was a Excisional Skin/Subcutaneous Tissue/Muscle/Bone Debridement with a total area of 0.2 sq cm performed by Tommie Sams., PA-C. With the following instrument(s): Curette to remove Viable and Non-Viable tissue/material. [name], Dawn B. ([id number]) Material removed includes Bone,Subcutaneous Tissue, Slough, and Skin: Dermis after achieving pain control using Lidocaine. A time out was conducted at 16:03, prior to the start of the  procedure. A Minimum amount of bleeding was controlled with Pressure. The procedure was tolerated well. Post Debridement Measurements: 0.4cm length x 0.5cm width x 0.2cm depth; 0.031cm^3 volume. Character of Wound/Ulcer Post Debridement is improved. Severity of Tissue Post Debridement is: Bone involvement without necrosis. Post procedure Diagnosis Wound #16: Same as Pre-Procedure Wound #13 Pre-procedure diagnosis of Wound #13 is a Venous Leg Ulcer located on the Right,Midline Lower Leg . There was a Three Layer Compression Therapy Procedure by Donnamarie Poag, RN. Post procedure Diagnosis Wound #13: Same as Pre-Procedure Wound #17 Pre-procedure diagnosis of Wound #17 is a Venous Leg Ulcer located on the Left Lower Leg . There was a Three Layer Compression Therapy Procedure by Donnamarie Poag, RN. Post procedure Diagnosis Wound #17: Same as Pre-Procedure Plan Follow-up Appointments: Return Appointment in 2 weeks. Nurse Visit as needed Home Health: Wound #13 Right,Midline Lower Leg: [place]: - [place] for wound care. May utilize formulary equivalent dressing for wound treatment orders unless otherwise specified. Home Health Nurse may visit PRN to address patient s wound care needs. Scheduled days for dressing changes to be completed; exception, patient has scheduled wound care visit that day. **Please direct any NON-WOUND related issues/requests for orders to patient's Primary Care Physician. **If current dressing causes regression in wound condition, may D/C ordered dressing product/s and apply Normal Saline Moist Dressing daily until next [place] or Other MD appointment. **Notify [facility] of regression in wound condition at [phone]. Bathing/ Shower/ Hygiene: May shower with wound dressing protected with water repellent cover or cast protector. No tub bath. Edema Control - Lymphedema / Segmental Compressive Device / Other: Patient to wear own compression stockings. Remove compression stockings every night before going to bed and put on every morning when getting up. - WHEN NOT USING A COMPRESSION WRAP, USE COMPRESSION HOSE Elevate, Exercise Daily and Avoid Standing for Long Periods of Time. Elevate legs to the level of the heart and pump ankles as often as possible Elevate leg(s) parallel to the floor when sitting. DO YOUR BEST to sleep in the bed at night. DO NOT sleep in your recliner. Long hours of sitting in a recliner leads to swelling of the legs and/or potential wounds on your backside. Off-Loading: Wound #16 Left,Distal Toe Fourth: Open toe surgical shoe - keep pressure off wound/toes Other: - suggest float heels when in bed with a pillow to keep pressure off of heels; choose shoewear for right foot to not rub foot Additional Orders / Instructions: Follow Nutritious Diet and Increase Protein Intake Medications-Please add to medication list.: P.O. Antibiotics - Finish Antibiotics as prescribed WOUND #13: -  Lower Leg Wound Laterality: Right, Midline Cleanser: Soap and Water 3 x Per Week/30 Days Discharge Instructions: Gently cleanse wound with antibacterial soap, rinse and pat dry prior to dressing wounds Cleanser: Wound Cleanser 3 x Per Week/30 Days Discharge Instructions: Wash your hands with soap and water. Remove old dressing, discard into plastic bag and place into trash. Cleanse the wound with Wound Cleanser prior to applying a clean dressing using gauze sponges, not tissues or cotton balls. Do not scrub or use excessive force. Pat dry using gauze sponges, not tissue or cotton balls. Primary Dressing: Hydrofera Blue Ready Transfer Foam, 2.5x2.5 (in/in) (Home Health) 3 x Per Week/30 Days Discharge Instructions: Apply Hydrofera Blue Ready to wound bed as directed Secondary Dressing: ABD Pad 5x9 (in/in) (Home Health) 3 x Per Week/30 Days Discharge Instructions: Cover with ABD pad Compression  Wrap: Profore Lite LF 3 Multilayer Compression Bandaging System (Home Health) 3 x Per Week/30 Days Discharge Instructions: Apply 3 multi-layer wrap as prescribed. WOUND #16: - Toe Fourth Wound Laterality: Left, Distal Cleanser: Soap and Water Every Other Day/30 Days Discharge Instructions: Gently cleanse wound with antibacterial soap, rinse and pat dry prior to dressing wounds Cleanser: Wound Cleanser Every Other Day/30 Days Discharge Instructions: Wash your hands with soap and water. Remove old dressing, discard into plastic bag and place into trash. Cleanse the wound with Wound Cleanser prior to applying a clean dressing using gauze sponges, not tissues or cotton balls. Do not scrub or use excessive force. Pat dry using gauze sponges, not tissue or cotton balls. Primary Dressing: Hydrofera Blue Ready Transfer Foam, 2.5x2.5 (in/in) (Home Health) Every Other Day/30 Days Discharge Instructions: Apply Hydrofera Blue Ready to wound bed as directed [name], [name] ([id number]) Secondary Dressing: ABD Pad  5x9 (in/in) Every Other Day/30 Days Discharge Instructions: Cover with ABD pad or cut in half Secured With: 52M Medipore H Soft Cloth Surgical Tape, 2x2 (in/yd) Every Other Day/30 Days Secured With: [employer] Sterile or Non-Sterile 6-ply 4.5x4 (yd/yd) Every Other Day/30 Days Discharge Instructions: Apply Kerlix as directed WOUND #17: - Lower Leg Wound Laterality: Left Cleanser: Normal Saline 3 x Per Week/30 Days Discharge Instructions: Wash your hands with soap and water. Remove old dressing, discard into plastic bag and place into trash. Cleanse the wound with Normal Saline prior to applying a clean dressing using gauze sponges, not tissues or cotton balls. Do not scrub or use excessive force. Pat dry using gauze sponges, not tissue or cotton balls. Primary Dressing: Hydrofera Blue Ready Transfer Foam, 2.5x2.5 (in/in) 3 x Per Week/30 Days Discharge Instructions: Apply Hydrofera Blue Ready to wound bed as directed Secondary Dressing: ABD Pad 5x9 (in/in) 3 x Per Week/30 Days Discharge Instructions: Cover with ABD pad Compression Wrap: Profore Lite LF 3 Multilayer Compression Bandaging System 3 x Per Week/30 Days Discharge Instructions: Apply 3 multi-layer wrap as prescribed. 1. Would recommend currently that we going to continue with wound care measures as before the patient is in agreement with that plan this includes the use of the [facility] which I think is doing quite well for her. 2. I am also can recommend that we continue with the 3 layer compression wrap bilaterally which I think is also a good option here. We will see patient back for reevaluation in 1 week here in the clinic. If anything worsens or changes patient will contact our office for additional recommendations. Electronic Signature(s) Signed: 01/28/2021 4:55:45 PM By: Worthy Keeler PA-C Entered By: Worthy Keeler on 01/28/2021 16:55:45 [place], [name]  ([id number]) -------------------------------------------------------------------------------- SuperBill Details Patient Name: [name] Date of Service: 01/28/2021 Medical Record Number: [id number] Patient Account Number: [id number] Date of Birth/Sex: [birth year] (70 y.o. F) Treating RN: Donnamarie Poag Primary Care Provider: [name], Olivia Mackie Other Clinician: Referring Provider: [name], Olivia Mackie Treating Provider/Extender: Skipper Cliche in Treatment: 30 Diagnosis Coding ICD-10 Codes Code Description E11.621 Type 2 diabetes mellitus with foot ulcer I87.2 Venous insufficiency (chronic) (peripheral) Z79.01 Long term (current) use of anticoagulants L97.812 Non-pressure chronic ulcer of other part of right lower leg with fat layer exposed L97.522 Non-pressure chronic ulcer of other part of left foot with fat layer exposed Facility Procedures The patient participates with Medicare or their insurance follows the Medicare Facility Guidelines: CPT4 Code Description Modifier Quantity [id number] 11044 - DEB BONE 20 SQ CM/< 1 The patient participates with Medicare  or their insurance follows the Medicare Facility Guidelines: ICD-10 Diagnosis Description L97.522 Non-pressure chronic ulcer of other part of left foot with fat layer exposed Physician Procedures CPT4: Description Modifier Quantity Code 8563149 Debridement; bone (includes epidermis, dermis, subQ tissue, muscle and/or fascia, if performed) 1st 20 1 sqcm or less CPT4: ICD-10 Diagnosis Description L97.522 Non-pressure chronic ulcer of other part of left foot with fat layer exposed Electronic Signature(s) Signed: 01/28/2021 4:56:34 PM By: Worthy Keeler PA-C Previous Signature: 01/28/2021 4:33:42 PM Version By: Donnamarie Poag Entered By: Worthy Keeler on 01/28/2021 16:56:34

## 2021-01-28 NOTE — Patient Instructions (Signed)
Medication Instructions:  Your physician has recommended you make the following change in your medication:   START Furosemide 20 mg and take 2 tablets for 3 days THEN go to Furosemide 20 mg and take 1 tablet a day DECREASE Metoprolol succinate (Toprol) 25 mg and take one half tablet (12.5 mg) once a day  *If you need a refill on your cardiac medications before your next appointment, please call your pharmacy*   Lab Work: None  If you have labs (blood work) drawn today and your tests are completely normal, you will receive your results only by: [facility] (if you have MyChart) OR A paper copy in the mail If you have any lab test that is abnormal or we need to change your treatment, we will call you to review the results.   Testing/Procedures: None   Follow-Up: At [facility], you and your health needs are our priority.  As part of our continuing mission to provide you with exceptional heart care, we have created designated Provider Care Teams.  These Care Teams include your primary Cardiologist (physician) and Advanced Practice Providers (APPs -  Physician Assistants and Nurse Practitioners) who all work together to provide you with the care you need, when you need it.   Your next appointment:   1-2 week(s)  The format for your next appointment:   In Person  Provider:   Murray Hodgkins, NP

## 2021-01-29 ENCOUNTER — Telehealth: Payer: Self-pay | Admitting: Nurse Practitioner

## 2021-01-29 ENCOUNTER — Encounter: Payer: Self-pay | Admitting: Internal Medicine

## 2021-01-29 ENCOUNTER — Ambulatory Visit: Payer: Medicare HMO

## 2021-01-29 ENCOUNTER — Telehealth: Payer: Self-pay | Admitting: Internal Medicine

## 2021-01-29 DIAGNOSIS — I152 Hypertension secondary to endocrine disorders: Secondary | ICD-10-CM | POA: Diagnosis not present

## 2021-01-29 DIAGNOSIS — E1159 Type 2 diabetes mellitus with other circulatory complications: Secondary | ICD-10-CM | POA: Diagnosis not present

## 2021-01-29 DIAGNOSIS — I4819 Other persistent atrial fibrillation: Secondary | ICD-10-CM | POA: Diagnosis not present

## 2021-01-29 DIAGNOSIS — E1152 Type 2 diabetes mellitus with diabetic peripheral angiopathy with gangrene: Secondary | ICD-10-CM | POA: Diagnosis not present

## 2021-01-29 DIAGNOSIS — E1142 Type 2 diabetes mellitus with diabetic polyneuropathy: Secondary | ICD-10-CM | POA: Diagnosis not present

## 2021-01-29 DIAGNOSIS — I69320 Aphasia following cerebral infarction: Secondary | ICD-10-CM | POA: Diagnosis not present

## 2021-01-29 DIAGNOSIS — I69319 Unspecified symptoms and signs involving cognitive functions following cerebral infarction: Secondary | ICD-10-CM | POA: Diagnosis not present

## 2021-01-29 DIAGNOSIS — I872 Venous insufficiency (chronic) (peripheral): Secondary | ICD-10-CM | POA: Diagnosis not present

## 2021-01-29 DIAGNOSIS — G8194 Hemiplegia, unspecified affecting left nondominant side: Secondary | ICD-10-CM | POA: Diagnosis not present

## 2021-01-29 DIAGNOSIS — J449 Chronic obstructive pulmonary disease, unspecified: Secondary | ICD-10-CM | POA: Diagnosis not present

## 2021-01-29 NOTE — Telephone Encounter (Signed)
Spoke with Alyse Low a home health nurse, reviewed patients last office visit note from yesterday when she saw Ignacia Bayley. Informed her that no labs have been ordered from our office. All labs ordered appear to be from patients PCP only. Alyse Low was grateful for the information.

## 2021-01-29 NOTE — Telephone Encounter (Signed)
Home health nurse is calling stating she is at the patient's home drawing labs and would like to know what labs we are ordering to be drawn. Please advise.

## 2021-01-29 NOTE — Telephone Encounter (Signed)
Per [facility] Nurse called in stating that she took Pt vitals and the reading of the vitals is out of normal perimeter. [facility] Nurse state that Pt temp is at 96 and heart rate is at 57 about noon time. [name] stated that she recheck Pt heart rate at 1:33pm is at 51. [name] stated around 1:37pm Pt heart rate was at 46. [name] stated Pt is alert and responsive. Pt is sitting in her chair with her leg propped up but not elevated. Pt is not experiencing headaches or dizziness.

## 2021-01-29 NOTE — Telephone Encounter (Signed)
Please advise 

## 2021-01-30 ENCOUNTER — Telehealth: Payer: Self-pay | Admitting: Internal Medicine

## 2021-01-30 ENCOUNTER — Other Ambulatory Visit: Payer: Self-pay

## 2021-01-30 ENCOUNTER — Ambulatory Visit
Admission: RE | Admit: 2021-01-30 | Discharge: 2021-01-30 | Disposition: A | Discharge status: Home / Self Care | Payer: Medicare HMO | Source: Ambulatory Visit | Attending: Internal Medicine | Admitting: Internal Medicine

## 2021-01-30 DIAGNOSIS — M898X2 Other specified disorders of bone, upper arm: Secondary | ICD-10-CM | POA: Diagnosis not present

## 2021-01-30 DIAGNOSIS — M25412 Effusion, left shoulder: Secondary | ICD-10-CM | POA: Diagnosis not present

## 2021-01-30 DIAGNOSIS — M25512 Pain in left shoulder: Secondary | ICD-10-CM | POA: Insufficient documentation

## 2021-01-30 DIAGNOSIS — G8194 Hemiplegia, unspecified affecting left nondominant side: Secondary | ICD-10-CM | POA: Diagnosis not present

## 2021-01-30 DIAGNOSIS — M19012 Primary osteoarthritis, left shoulder: Secondary | ICD-10-CM | POA: Insufficient documentation

## 2021-01-30 DIAGNOSIS — I152 Hypertension secondary to endocrine disorders: Secondary | ICD-10-CM | POA: Diagnosis not present

## 2021-01-30 DIAGNOSIS — I4819 Other persistent atrial fibrillation: Secondary | ICD-10-CM | POA: Diagnosis not present

## 2021-01-30 DIAGNOSIS — I69320 Aphasia following cerebral infarction: Secondary | ICD-10-CM | POA: Diagnosis not present

## 2021-01-30 DIAGNOSIS — E1142 Type 2 diabetes mellitus with diabetic polyneuropathy: Secondary | ICD-10-CM | POA: Diagnosis not present

## 2021-01-30 DIAGNOSIS — I69319 Unspecified symptoms and signs involving cognitive functions following cerebral infarction: Secondary | ICD-10-CM | POA: Diagnosis not present

## 2021-01-30 DIAGNOSIS — G8929 Other chronic pain: Secondary | ICD-10-CM | POA: Diagnosis not present

## 2021-01-30 DIAGNOSIS — E1152 Type 2 diabetes mellitus with diabetic peripheral angiopathy with gangrene: Secondary | ICD-10-CM | POA: Diagnosis not present

## 2021-01-30 DIAGNOSIS — E1159 Type 2 diabetes mellitus with other circulatory complications: Secondary | ICD-10-CM | POA: Diagnosis not present

## 2021-01-30 DIAGNOSIS — J449 Chronic obstructive pulmonary disease, unspecified: Secondary | ICD-10-CM | POA: Diagnosis not present

## 2021-01-30 DIAGNOSIS — Z8673 Personal history of transient ischemic attack (TIA), and cerebral infarction without residual deficits: Secondary | ICD-10-CM | POA: Diagnosis not present

## 2021-01-30 DIAGNOSIS — M75112 Incomplete rotator cuff tear or rupture of left shoulder, not specified as traumatic: Secondary | ICD-10-CM | POA: Diagnosis not present

## 2021-01-30 DIAGNOSIS — I872 Venous insufficiency (chronic) (peripheral): Secondary | ICD-10-CM | POA: Diagnosis not present

## 2021-01-30 LAB — HEMOGLOBIN A1C: Hemoglobin A1C: 7.7

## 2021-01-30 NOTE — Telephone Encounter (Signed)
For your information  

## 2021-01-30 NOTE — Telephone Encounter (Signed)
Sharee Pimple, from [facility] wanted Dr Olivia Mackie to know that patient's BP was 155/84 after getting out of the shower and getting dressed, pulse 58. Sharee Pimple states patient is not in distress, and does not feel she needs to go to ED, ust wanted to let Dr Olivia Mackie know. Patient was offered an appointment to come into office to be checked out, but patient did not want an appointment.

## 2021-01-31 ENCOUNTER — Telehealth: Payer: Self-pay | Admitting: Internal Medicine

## 2021-01-31 ENCOUNTER — Other Ambulatory Visit: Payer: Self-pay | Admitting: Family

## 2021-01-31 DIAGNOSIS — E1142 Type 2 diabetes mellitus with diabetic polyneuropathy: Secondary | ICD-10-CM

## 2021-01-31 DIAGNOSIS — M797 Fibromyalgia: Secondary | ICD-10-CM

## 2021-01-31 NOTE — Telephone Encounter (Signed)
Lft pt vm to call ofc to sch MRI. Thanks

## 2021-02-03 DIAGNOSIS — I69319 Unspecified symptoms and signs involving cognitive functions following cerebral infarction: Secondary | ICD-10-CM | POA: Diagnosis not present

## 2021-02-03 DIAGNOSIS — I872 Venous insufficiency (chronic) (peripheral): Secondary | ICD-10-CM | POA: Diagnosis not present

## 2021-02-03 DIAGNOSIS — I69320 Aphasia following cerebral infarction: Secondary | ICD-10-CM | POA: Diagnosis not present

## 2021-02-03 DIAGNOSIS — J449 Chronic obstructive pulmonary disease, unspecified: Secondary | ICD-10-CM | POA: Diagnosis not present

## 2021-02-03 DIAGNOSIS — G8194 Hemiplegia, unspecified affecting left nondominant side: Secondary | ICD-10-CM | POA: Diagnosis not present

## 2021-02-03 DIAGNOSIS — I152 Hypertension secondary to endocrine disorders: Secondary | ICD-10-CM | POA: Diagnosis not present

## 2021-02-03 DIAGNOSIS — E1152 Type 2 diabetes mellitus with diabetic peripheral angiopathy with gangrene: Secondary | ICD-10-CM | POA: Diagnosis not present

## 2021-02-03 DIAGNOSIS — E1159 Type 2 diabetes mellitus with other circulatory complications: Secondary | ICD-10-CM | POA: Diagnosis not present

## 2021-02-03 DIAGNOSIS — E1142 Type 2 diabetes mellitus with diabetic polyneuropathy: Secondary | ICD-10-CM | POA: Diagnosis not present

## 2021-02-03 DIAGNOSIS — I4819 Other persistent atrial fibrillation: Secondary | ICD-10-CM | POA: Diagnosis not present

## 2021-02-03 NOTE — Telephone Encounter (Signed)
Bradycardia and HTN at home cardiology appt 02/06/21  Will have cardiology address inform [place] home [facility]

## 2021-02-03 NOTE — Telephone Encounter (Signed)
Pt has appt cardiology 02/06/21 inform will have cardiology address

## 2021-02-03 NOTE — Telephone Encounter (Signed)
Also Gerald Stabs seeing pt 02/06/21  -in addition to BP being elevated, bradycardia can you assess legs for swelling and if this needs further work up or fluid pill? She does have a h/o AKI she is mostly transfer/bed bound and had like an infection possibly doing on in 1 of her legs as well being followed by podiatry and wound  -can you do labs while she is in the office? -I believe she has an update echo  -if concern for DVT order [place] legs not sure if pt went to get checked out ? -really for all of these changes pt needs to be seen in office in the future or consider palliative care for home but I think she declined this in the past

## 2021-02-04 DIAGNOSIS — I69319 Unspecified symptoms and signs involving cognitive functions following cerebral infarction: Secondary | ICD-10-CM | POA: Diagnosis not present

## 2021-02-04 DIAGNOSIS — E1152 Type 2 diabetes mellitus with diabetic peripheral angiopathy with gangrene: Secondary | ICD-10-CM | POA: Diagnosis not present

## 2021-02-04 DIAGNOSIS — I4819 Other persistent atrial fibrillation: Secondary | ICD-10-CM | POA: Diagnosis not present

## 2021-02-04 DIAGNOSIS — I69320 Aphasia following cerebral infarction: Secondary | ICD-10-CM | POA: Diagnosis not present

## 2021-02-04 DIAGNOSIS — G8194 Hemiplegia, unspecified affecting left nondominant side: Secondary | ICD-10-CM | POA: Diagnosis not present

## 2021-02-04 DIAGNOSIS — E1159 Type 2 diabetes mellitus with other circulatory complications: Secondary | ICD-10-CM | POA: Diagnosis not present

## 2021-02-04 DIAGNOSIS — E1142 Type 2 diabetes mellitus with diabetic polyneuropathy: Secondary | ICD-10-CM | POA: Diagnosis not present

## 2021-02-04 DIAGNOSIS — I152 Hypertension secondary to endocrine disorders: Secondary | ICD-10-CM | POA: Diagnosis not present

## 2021-02-04 DIAGNOSIS — I872 Venous insufficiency (chronic) (peripheral): Secondary | ICD-10-CM | POA: Diagnosis not present

## 2021-02-04 DIAGNOSIS — J449 Chronic obstructive pulmonary disease, unspecified: Secondary | ICD-10-CM | POA: Diagnosis not present

## 2021-02-04 NOTE — Telephone Encounter (Signed)
Noted  Patient seeing cardiology 02/06/21

## 2021-02-04 NOTE — Telephone Encounter (Signed)
Noted  

## 2021-02-04 NOTE — Telephone Encounter (Signed)
Thanks. Looks like this phone note began prior to me seeing her and adding lasix on 11/8.  She refused labs that day b/c she said that she was due for labs through primary care office, which would be drawn by home health the following day - 11/9.  I don't see anything in epic.  I'll check labs when seen later this week, as planned.

## 2021-02-05 ENCOUNTER — Ambulatory Visit: Payer: Medicare HMO

## 2021-02-05 ENCOUNTER — Other Ambulatory Visit: Payer: Self-pay | Admitting: Internal Medicine

## 2021-02-05 DIAGNOSIS — I4819 Other persistent atrial fibrillation: Secondary | ICD-10-CM | POA: Diagnosis not present

## 2021-02-05 DIAGNOSIS — I152 Hypertension secondary to endocrine disorders: Secondary | ICD-10-CM | POA: Diagnosis not present

## 2021-02-05 DIAGNOSIS — J449 Chronic obstructive pulmonary disease, unspecified: Secondary | ICD-10-CM | POA: Diagnosis not present

## 2021-02-05 DIAGNOSIS — I69320 Aphasia following cerebral infarction: Secondary | ICD-10-CM | POA: Diagnosis not present

## 2021-02-05 DIAGNOSIS — G8194 Hemiplegia, unspecified affecting left nondominant side: Secondary | ICD-10-CM | POA: Diagnosis not present

## 2021-02-05 DIAGNOSIS — I872 Venous insufficiency (chronic) (peripheral): Secondary | ICD-10-CM | POA: Diagnosis not present

## 2021-02-05 DIAGNOSIS — I69319 Unspecified symptoms and signs involving cognitive functions following cerebral infarction: Secondary | ICD-10-CM | POA: Diagnosis not present

## 2021-02-05 DIAGNOSIS — E1159 Type 2 diabetes mellitus with other circulatory complications: Secondary | ICD-10-CM | POA: Diagnosis not present

## 2021-02-05 DIAGNOSIS — E1152 Type 2 diabetes mellitus with diabetic peripheral angiopathy with gangrene: Secondary | ICD-10-CM | POA: Diagnosis not present

## 2021-02-05 DIAGNOSIS — E039 Hypothyroidism, unspecified: Secondary | ICD-10-CM

## 2021-02-05 DIAGNOSIS — E1142 Type 2 diabetes mellitus with diabetic polyneuropathy: Secondary | ICD-10-CM | POA: Diagnosis not present

## 2021-02-05 MED ORDER — LEVOTHYROXINE SODIUM 150 MCG PO TABS: 150.0000 ug | ORAL_TABLET | Freq: Every day | ORAL | 3 refills | Status: DC

## 2021-02-05 NOTE — Telephone Encounter (Signed)
[name] please see me have copy of labs on my desk  1) fax to [name] she likely needs repeat BMET after lasix  2) scan labs into chart  3)Rec pt take multivitamin with iron daily -is she agreeable to see hematology to follow up on anemia?  4)drink 55 ounces of water daily  5) thyroid low slightly if taking thyroid medication is she agreeable to change the dose lower? Also do not take thyroid meds with vitamins  Take 1st thing in am on empty stomach   [name] thanks for the update it is hard for me as PCP to manage patient via telephone calls and when h/h calls in have offered palliative care services to her in the past but declines and mobility and transportation limit her coming into the office at times    Labs [facility] not in epic needs to be scanned 01/29/21  H/h 9.6/32.8 TIBC 452 H UIBC 421 H iron 31 normal but low iron saturation 7 (low), ferritin 15 low normal  Sugar 114 high  Creatinine 1.22 slightly elevated and GFR 47 K 5.1 Na 142 Ast 24 nl  Alt 14 nl  A1c 7.7 H-rec healthy diet choices  Tsh 0.137 low  Lipid tc 104, trigs 71, hdl 59, ldl 30

## 2021-02-06 ENCOUNTER — Other Ambulatory Visit: Payer: Self-pay

## 2021-02-06 ENCOUNTER — Encounter: Payer: Self-pay | Admitting: Nurse Practitioner

## 2021-02-06 ENCOUNTER — Ambulatory Visit (INDEPENDENT_AMBULATORY_CARE_PROVIDER_SITE_OTHER): Payer: Medicare HMO | Admitting: Nurse Practitioner

## 2021-02-06 VITALS — BP 130/60 | HR 60 | Ht 65.5 in | Wt 255.0 lb

## 2021-02-06 DIAGNOSIS — E119 Type 2 diabetes mellitus without complications: Secondary | ICD-10-CM | POA: Diagnosis not present

## 2021-02-06 DIAGNOSIS — I5033 Acute on chronic diastolic (congestive) heart failure: Secondary | ICD-10-CM | POA: Diagnosis not present

## 2021-02-06 DIAGNOSIS — I4819 Other persistent atrial fibrillation: Secondary | ICD-10-CM | POA: Diagnosis not present

## 2021-02-06 DIAGNOSIS — E1152 Type 2 diabetes mellitus with diabetic peripheral angiopathy with gangrene: Secondary | ICD-10-CM | POA: Diagnosis not present

## 2021-02-06 DIAGNOSIS — G8194 Hemiplegia, unspecified affecting left nondominant side: Secondary | ICD-10-CM | POA: Diagnosis not present

## 2021-02-06 DIAGNOSIS — E1159 Type 2 diabetes mellitus with other circulatory complications: Secondary | ICD-10-CM | POA: Diagnosis not present

## 2021-02-06 DIAGNOSIS — Z8673 Personal history of transient ischemic attack (TIA), and cerebral infarction without residual deficits: Secondary | ICD-10-CM

## 2021-02-06 DIAGNOSIS — J449 Chronic obstructive pulmonary disease, unspecified: Secondary | ICD-10-CM | POA: Diagnosis not present

## 2021-02-06 DIAGNOSIS — E1142 Type 2 diabetes mellitus with diabetic polyneuropathy: Secondary | ICD-10-CM | POA: Diagnosis not present

## 2021-02-06 DIAGNOSIS — I480 Paroxysmal atrial fibrillation: Secondary | ICD-10-CM

## 2021-02-06 DIAGNOSIS — I69319 Unspecified symptoms and signs involving cognitive functions following cerebral infarction: Secondary | ICD-10-CM | POA: Diagnosis not present

## 2021-02-06 DIAGNOSIS — I872 Venous insufficiency (chronic) (peripheral): Secondary | ICD-10-CM | POA: Diagnosis not present

## 2021-02-06 DIAGNOSIS — I10 Essential (primary) hypertension: Secondary | ICD-10-CM | POA: Diagnosis not present

## 2021-02-06 DIAGNOSIS — Z23 Encounter for immunization: Secondary | ICD-10-CM

## 2021-02-06 DIAGNOSIS — I152 Hypertension secondary to endocrine disorders: Secondary | ICD-10-CM | POA: Diagnosis not present

## 2021-02-06 DIAGNOSIS — I69320 Aphasia following cerebral infarction: Secondary | ICD-10-CM | POA: Diagnosis not present

## 2021-02-06 NOTE — Progress Notes (Signed)
Office Visit    Patient Name: [name] Date of Encounter: 02/06/2021  Primary Care Provider:  [name], [name], MD Primary Cardiologist:  Kathlyn Sacramento, MD  Chief Complaint    70 year old female with a history of paroxysmal atrial fibrillation, prior strokes, hypertension, hyperlipidemia, diabetes, obesity, chronic pain, DVT, sleep apnea on CPAP, and obesity, presents for follow-up related to lower extremity swelling.  Past Medical History    Past Medical History:  Diagnosis Date   Abnormal antibody titer    Anxiety and depression    Arthritis    Asthma    Atypical chest pain    a. 03/2018 MV: No ischemia/infarct. EF >65%.   Carotid arterial disease ([place])    a. 03/6107 U/S: RICA <60, LICA nl.   Cystocele    Depression    Diabetes ([place])    DVT (deep venous thrombosis) ([place])    Righ calf   Dyspnea    11/08/2018 - "more now due to lack of exercise"   Edema    GERD (gastroesophageal reflux disease)    Heart murmur    a. 08/2013 Echo: EF 60-65%, Mild MR (poss veg seen on TTE, not seen on TEE), nl LV size with mod conc LVH. Mild LAE; b. 08/2019 Echo: EF 60-65%, no rwma. Nl RV size/fxn. No significant valvular dzs.   History of IBS    History of kidney stones    History of methicillin resistant staphylococcus aureus (MRSA) 2008   History of multiple strokes    Hyperlipidemia    Hypertension    Hypothyroidism    Insomnia    Kidney stone    kidney stones with lithotripsy .  [facility]- "adrenal glands"   Neuropathy involving both lower extremities    Non-healing ulcer of left foot (HCC)    Obesity, Class II, BMI 35-39.9, with comorbidity    PAF (paroxysmal atrial fibrillation) ([place])    a. 07/2013 Event monitor: Freq PVCs and 3 short runs of Afib-->CHA2DS2VASc = 7-->Eliquis.   Peripheral vascular disease ([place])    a. 03/2018 LE Angio: Nl renal arteries. Nl aorta & iliacs. Nl LLE arterial flow; b. 06/2020 ABI's:   Sleep apnea    use C-PAP   Urinary  incontinence    Venous stasis dermatitis of both lower extremities    Past Surgical History:  Procedure Laterality Date   ANKLE SURGERY Right    APPLICATION OF WOUND VAC Left 06/27/2015   Procedure: APPLICATION OF WOUND VAC ( POSSIBLE ) ;  Surgeon: [name], MD;  Location: [facility] ORS;  Service: Vascular;  Laterality: Left;   APPLICATION OF WOUND VAC Left 09/25/2016   Procedure: APPLICATION OF WOUND VAC;  Surgeon: [name], DPM;  Location: [facility] ORS;  Service: Podiatry;  Laterality: Left;   arm surgery     right     CHOLECYSTECTOMY     COLONOSCOPY     COLONOSCOPY WITH PROPOFOL N/A 01/11/2017   Procedure: COLONOSCOPY WITH PROPOFOL;  Surgeon: [name], MD;  Location: [facility] ENDOSCOPY;  Service: Endoscopy;  Laterality: N/A;   COLONOSCOPY WITH PROPOFOL N/A 02/22/2017   Procedure: COLONOSCOPY WITH PROPOFOL;  Surgeon: [name], MD;  Location: [facility] ENDOSCOPY;  Service: Endoscopy;  Laterality: N/A;   COLONOSCOPY WITH PROPOFOL N/A 05/31/2017   Procedure: COLONOSCOPY WITH PROPOFOL;  Surgeon: [name], MD;  Location[facility] ENDOSCOPY;  Service: Endoscopy;  Laterality: N/A;   ESOPHAGOGASTRODUODENOSCOPY (EGD) WITH PROPOFOL N/A 01/11/2017   Procedure: ESOPHAGOGASTRODUODENOSCOPY (EGD) WITH PROPOFOL;  Surgeon: [name], MD;  Location: [facility] ENDOSCOPY;  Service: Endoscopy;  Laterality: N/A;   ESOPHAGOGASTRODUODENOSCOPY (EGD) WITH PROPOFOL N/A 10/25/2018   Procedure: ESOPHAGOGASTRODUODENOSCOPY (EGD) WITH PROPOFOL;  Surgeon: [name], MD;  Location: [facility] ENDOSCOPY;  Service: Endoscopy;  Laterality: N/A;   ESOPHAGOGASTRODUODENOSCOPY (EGD) WITH PROPOFOL N/A 11/29/2019   Procedure: ESOPHAGOGASTRODUODENOSCOPY (EGD) WITH PROPOFOL;  Surgeon: Toledo, Benay Pike, MD;  Location: [facility] ENDOSCOPY;  Service: Gastroenterology;  Laterality: N/A;   HERNIA REPAIR     umbilical   HIATAL HERNIA REPAIR     I & D EXTREMITY Left 06/27/2015   Procedure: IRRIGATION AND DEBRIDEMENT EXTREMITY             ( CALF HEMATOMA ) POSSIBLE WOUND VAC;  Surgeon: [name], MD;  Location: [facility] ORS;  Service: Vascular;  Laterality: Left;   INCISION AND DRAINAGE OF WOUND Left 09/25/2016   Procedure: IRRIGATION AND DEBRIDEMENT - PARTIAL RESECTION OF ACHILLES TENDON WITH WOUND VAC APPLICATION;  Surgeon: [name], DPM;  Location: [facility] ORS;  Service: Podiatry;  Laterality: Left;   INCISION AND DRAINAGE OF WOUND Left 11/09/2018   Procedure: Excision of left foot wound with ACell placement;  Surgeon: Wallace Going, DO;  Location: [place];  Service: Plastics;  Laterality: Left;   IRRIGATION AND DEBRIDEMENT ABSCESS Left 09/07/2016   Procedure: IRRIGATION AND DEBRIDEMENT ABSCESS LEFT HEEL;  Surgeon: [name], DPM;  Location: [facility];  Service: Podiatry;  Laterality: Left;   JOINT REPLACEMENT  2013   left knee replacement   KIDNEY SURGERY Right    kidney stones   LITHOTRIPSY     LOWER EXTREMITY ANGIOGRAPHY Left 04/04/2018   Procedure: LOWER EXTREMITY ANGIOGRAPHY;  Surgeon: [name], MD;  Location: [place] CV LAB;  Service: Cardiovascular;  Laterality: Left;   NM GATED MYOVIEW [facility] HX)  February 2017   Likely breast attenuation. LOW RISK study. Normal EF 55-60%.   OTHER SURGICAL HISTORY     08/2016 or 09/2016 surgery on achilles tenso h/o staph infection heal. Dr. [name]   removal of left hematoma Left    leg   REVERSE SHOULDER ARTHROPLASTY Right 10/08/2015   Procedure: REVERSE SHOULDER ARTHROPLASTY;  Surgeon: [name], MD;  Location: [facility] ORS;  Service: Orthopedics;  Laterality: Right;   SLEEVE GASTROPLASTY     TONSILLECTOMY     TOTAL KNEE ARTHROPLASTY Left    TOTAL SHOULDER REPLACEMENT Right    TRANSESOPHAGEAL ECHOCARDIOGRAM  08/08/2013   Mild LVH, EF 60-65%. Moderate LA dilation and mild RA dilation. Mild MR with no evidence of stenosis and no evidence of endocarditis. A false chordae is noted.   TRANSTHORACIC ECHOCARDIOGRAM  08/03/2013   Mild-Moderate concentric  LVH, EF 60-65%. Normal diastolic function. Mild LA dilation. Mild MR with possible vegetation  - not confirmed on TEE    ULNAR NERVE TRANSPOSITION Right 08/06/2016   Procedure: ULNAR NERVE DECOMPRESSION/TRANSPOSITION;  Surgeon: [name], MD;  Location: [facility] ORS;  Service: Orthopedics;  Laterality: Right;   UPPER GI ENDOSCOPY     VAGINAL HYSTERECTOMY      Allergies  Allergies  Allergen Reactions   Morphine And Related Other (See Comments) and Nausea Only    Patient becomes very confused Other reaction(s): Other (See Comments), Other (See Comments) Patient becomes very confused Patient becomes very confused    Penicillins Hives, Shortness Of Breath, Swelling and Other (See Comments)    Facial swelling Has patient had a PCN reaction causing immediate rash, facial/tongue/throat swelling, SOB or  lightheadedness with hypotension: Yes Has patient had a PCN reaction causing severe rash involving mucus membranes or skin necrosis: Yes Has patient had a PCN reaction that required hospitalization No Has patient had a PCN reaction occurring within the last 10 years: No If all of the above answers are "NO", then may proceed with Cephalosporin use.  Other reaction(s): Other (See Comments), Other (See Comments) Facial swelling Has patient had a PCN reaction causing immediate rash, facial/tongue/throat swelling, SOB or lightheadedness with hypotension: Yes Has patient had a PCN reaction causing severe rash involving mucus membranes or skin necrosis: Yes Has patient had a PCN reaction that required hospitalization No Has patient had a PCN reaction occurring within the last 10 years: No If all of the above answers are "NO", then may proceed with Cephalosporin use. Facial swelling Has patient had a PCN reaction causing immediate rash, facial/tongue/throat swelling, SOB or lightheadedness with hypotension: Yes Has patient had a PCN reaction causing severe rash involving mucus membranes or skin  necrosis: Yes Has patient had a PCN reaction that required hospitalization No Has patient had a PCN reaction occurring within the last 10 years: No If all of the above answers are "NO", then may proceed with Cephalosporin use. Facial swelling Has patient had a PCN reaction causing immediate rash, facial/tongue/throat swelling, SOB or lightheadedness with hypotension: Yes Has patient had a PCN reaction causing severe rash involving mucus membranes or skin necrosis: Yes Has patient had a PCN reaction that required hospitalization No Has patient had a PCN reaction occurring within the last 10 years: No If all of the above answers are "NO", then may proceed with Cephalosporin use.    Ambien [Zolpidem]     Hallucination    Aspirin Hives   Oxytetracycline Hives    Other reaction(s): Unknown     History of Present Illness    70 year old female with the above complex past medical history including paroxysmal atrial fibrillation, strokes, hypertension, hyperlipidemia, diabetes, obesity, chronic pain, DVT, sleep apnea, and obesity.  She also has a history of chronic left foot ulceration followed by vascular surgery.  Angiogram in 2020 showed no evidence of PAD and she subsequently underwent debridement with improvement.  In the setting of atypical chest pain, she underwent stress testing in January 2020, which was nonischemic.  In June 2021, she was admitted with pneumonia, sepsis, and mild troponin elevation to a peak of 170.  Echocardiogram showed an EF of 60 to 65% without regional wall motion abnormalities.  It was felt that troponin elevation was secondary to demand ischemia:.  She was admitted in late September 2021 with altered mental status, and left upper extremity weakness.  MRI of the brain showed bilateral small multiple acute strokes suggestive of embolic etiology.  She was on Pradaxa at that time and this was transitioned to Eliquis.  Unfortunately, she suffered another stroke in November  2021.  Neurology suspected recurrent strokes are due to severe malnutrition given gastric sleeve and noncompliance with vitamins.  In January 2022, she was admitted again for altered mental status and concern for stroke, though this was ruled out.  Echo with bubble study showed normal LV function and no evidence of ASD or PFO.  Ms. Vaquera was last seen in cardiology clinic on November 8 in the setting of a 2-week history of increasing lower extremity edema and orthopnea.  She reported significant dietary noncompliance, eating out for every meal and also eating potato chips and [place] onion dip throughout most days.  I added  Lasix 40 mg x 3 days followed by 20 mg daily.  She notes significant improvement in swelling, abdominal girth, and resolution of orthopnea since her last visit.  She still has some swelling impacting her posterior lower thighs.  She has not been able to weigh at home secondary to unsteadiness.  She was unable to stand on our scale today either.  She denies chest pain, palpitations, PND, orthopnea, dizziness, syncope, or early satiety.  Home Medications    Current Outpatient Medications  Medication Sig Dispense Refill   acetaminophen (TYLENOL) 325 MG tablet Take 2 tablets (650 mg total) by mouth every 6 (six) hours as needed for mild pain or moderate pain (or Fever >/= 101).     albuterol (PROVENTIL) (2.5 MG/3ML) 0.083% nebulizer solution Take 3 mLs (2.5 mg total) by nebulization every 4 (four) hours as needed for wheezing or shortness of breath. 75 mL 12   ALPRAZolam (XANAX) 0.5 MG tablet TAKE 1/2 TABLET BY MOUTH TWICE DAILY AS NEEDED FOR ANXIETY 60 tablet 5   apixaban (ELIQUIS) 5 MG TABS tablet TAKE 1 TABLET BY MOUTH TWICE DAILY 180 tablet 1   buPROPion (WELLBUTRIN XL) 150 MG 24 hr tablet Take 1 tablet (150 mg total) by mouth daily. 90 tablet 3   calcium-vitamin D (OSCAL WITH D) 500-200 MG-UNIT tablet Take 1 tablet by mouth 2 (two) times daily with a meal. 60 tablet 0   cephALEXin  (KEFLEX) 500 MG capsule Take 500 mg by mouth 3 (three) times daily.     cholecalciferol (VITAMIN D) 25 MCG tablet Take 1 tablet (1,000 Units total) by mouth 2 (two) times daily with a meal. 30 tablet 2   clobetasol cream (TEMOVATE) 8.09 % Apply 1 application topically 2 (two) times daily as needed (skin irritation).      colchicine 0.6 MG tablet Take 1 tablet (0.6 mg total) by mouth as needed. May repeat 1 pill in 1 hr. No more than 3 total pills 1.8 mg in 24 hours as needed gout flare 30 tablet 2   collagenase (SANTYL) ointment Apply 1 application topically at bedtime. X7-10 days 30 g 0   diclofenac Sodium (VOLTAREN) 1 % GEL Apply 2 g topically 3 (three) times daily. 100 g 2   DULoxetine (CYMBALTA) 60 MG capsule Take 1 capsule (60 mg total) by mouth daily. 90 capsule 3   ergocalciferol (VITAMIN D2) 1.25 MG (50000 UT) capsule Take 50,000 Units by mouth once a week.     estradiol (ESTRACE) 0.1 MG/GM vaginal cream Place 1 Applicatorful vaginally every Monday, Wednesday, and Friday. Apply 0.5mg  (pea-sized amount)  just inside the vaginal introitus with a finger-tip on Monday, Wednesday and Friday nights, 42.5 g 11   fesoterodine (TOVIAZ) 8 MG TB24 tablet Take 1 tablet (8 mg total) by mouth daily. 90 tablet 3   fluconazole (DIFLUCAN) 150 MG tablet Take by mouth daily.     fluticasone-salmeterol (ADVAIR HFA) 115-21 MCG/ACT inhaler Inhale 2 puffs into the lungs daily. Rinse mouth thoroughly after use 1 each 12   folic acid (FOLVITE) 1 MG tablet Take 1 tablet (1 mg total) by mouth daily. 90 tablet 3   furosemide (LASIX) 20 MG tablet Take 1 tablet (20 mg total) by mouth daily. 30 tablet 0   HYDROcodone-acetaminophen (NORCO/VICODIN) 5-325 MG tablet Take 1 tablet by mouth 2 (two) times daily as needed for severe pain. Must last 30 days 45 tablet 0   levocetirizine (XYZAL) 5 MG tablet Take 1 tablet (5 mg total) by mouth  at bedtime as needed for allergies. 90 tablet 3   levothyroxine (SYNTHROID) 150 MCG tablet  Take 1 tablet (150 mcg total) by mouth daily before breakfast. D/c prior sig 90 tablet 3   losartan (COZAAR) 50 MG tablet Take 1 tablet (50 mg total) by mouth daily. D/c losartan 50-12.5 90 tablet 3   metFORMIN (GLUCOPHAGE) 500 MG tablet Take 1 tablet (500 mg total) by mouth daily with breakfast. With food 90 tablet 3   metoprolol succinate (TOPROL XL) 25 MG 24 hr tablet Take 0.5 tablets (12.5 mg total) by mouth daily. 45 tablet 0   Misc. Devices (TUB TRANSFER BOARD) MISC 1 Device by Does not apply route as needed. 1 each 0   montelukast (SINGULAIR) 10 MG tablet Take 1 tablet (10 mg total) by mouth at bedtime. 90 tablet 3   Multiple Vitamin (MULTIVITAMIN WITH MINERALS) TABS tablet Take 1 tablet by mouth daily.     mupirocin ointment (BACTROBAN) 2 % Apply 1 application topically 2 (two) times daily. Right lower leg 30 g 2   ondansetron (ZOFRAN) 4 MG tablet Take 1 tablet (4 mg total) by mouth every 8 (eight) hours as needed for nausea or vomiting. 90 tablet 1   oxybutynin (DITROPAN XL) 15 MG 24 hr tablet Take 1 tablet (15 mg total) by mouth at bedtime. Further refills urogyn Dr. Wannetta Sender if appropriate 90 tablet 0   pantoprazole (PROTONIX) 40 MG tablet Take 40 mg by mouth daily.      pregabalin (LYRICA) 50 MG capsule TAKE 1 CAPSULE (50 MG) BY MOUTH 3 TIMES DAILY 90 capsule 0   rosuvastatin (CRESTOR) 20 MG tablet TAKE 1 TABLET BY  MOUTH ONCE DAILY 30 tablet 5   sitaGLIPtin (JANUVIA) 50 MG tablet Take 1 tablet (50 mg total) by mouth daily. 90 tablet 3   sucralfate (CARAFATE) 1 GM/10ML suspension Take 1 g by mouth 3 (three) times daily.      thiamine 100 MG tablet Take 0.5 tablets (50 mg total) by mouth 2 (two) times daily with a meal. 60 tablet 2   vitamin B-12 1000 MCG tablet Take 1 tablet (1,000 mcg total) by mouth daily. 30 tablet 2   doxycycline (VIBRA-TABS) 100 MG tablet Take 1 tablet (100 mg total) by mouth 2 (two) times daily. With food (Patient not taking: Reported on 02/06/2021) 20 tablet 0    No current facility-administered medications for this visit.     Review of Systems    Significant improvement in lower extremity swelling since starting Lasix though she continues to have some edema to her lower posterior thighs.  She has chronic dyspnea on exertion and is mostly sedentary.  She denies chest pain, palpitations, PND, orthopnea, dizziness, syncope, or early satiety.  All other systems reviewed and are otherwise negative except as noted above.    Physical Exam    VS:  BP 130/60 (BP Location: Left Arm, Patient Position: Sitting, Cuff Size: Normal)   Pulse 60   Ht 5' 5.5" (1.664 m)   Wt 255 lb (115.7 kg)   BMI 41.79 kg/m  , BMI Body mass index is 41.79 kg/m.     GEN: Obese, in no acute distress. HEENT: normal. Neck: Supple, obese, difficult to gauge JVP.  No carotid bruits, or masses. Cardiac: RRR, no murmurs, rubs, or gallops. No clubbing, cyanosis.  Trace edema noted to bilateral posterior lower thighs, just above the knees.  The right lower extremity is wrapped but no obvious edema above the wrap.  No left  lower extremity edema-overall improved from a week ago.  Radials 2+/PT 2+ and equal bilaterally.  Respiratory:  Respirations regular and unlabored, diminished breath sounds at the bases. GI: Obese, soft, nontender, nondistended, BS + x 4. MS: no deformity or atrophy. Skin: warm and dry, no rash. Neuro:  Strength and sensation are intact. Psych: Normal affect.  Accessory Clinical Findings    ECG personally reviewed by me today -ECG difficult to interpret due to baseline artifact.  Patient was unable to get out of her wheelchair to lie flat for ECG and thus this was performed while sitting.  Suspect sinus rhythm with first-degree AV block and a heart rate of 70.  Cannot rule out junctional rhythm- no acute changes.  Labs [facility] not in epic needs to be scanned 01/29/21  H/h 9.6/32.8 TIBC 452 H UIBC 421 H iron 31 normal but low iron saturation 7 (low), ferritin 15  low normal  Sugar 114 high  Creatinine 1.22 slightly elevated and GFR 47 K 5.1 Na 142 Ast 24 nl  Alt 14 nl  A1c 7.7  Tsh 0.137 low  Assessment & Plan    1.  Acute on chronic heart failure with preserved ejection fraction: Echocardiogram in June 2021 showed an EF of 60-65%.  She was seen in November 8, secondary to progressive lower extremity swelling and orthopnea in the setting of dietary noncompliance and high sodium intake.  She has reduced her snacking and has been taking Lasix 20 mg daily with significant improvement in lower extremity swelling.  She still has trace bilateral posterior lower thigh edema.  I will follow-up a basic metabolic panel today.  Provided that this is stable, will continue current dose of Lasix.  We discussed the importance of ongoing sodium restriction, medication compliance, and symptom reporting.  She is unable to weigh herself daily due to history of strokes and unsteadiness.  Continue losartan, beta-blocker.  2.  Essential hypertension: Stable at 130/60 today.  Continue Lasix, losartan, metoprolol.  3.  Paroxysmal atrial fibrillation: Maintaining sinus rhythm.  Beta-blocker dose reduced to 12.5 mg daily last visit secondary to bradycardia with rates in the 40s.  She is anticoagulated on Eliquis.  4.  Hyperlipidemia: On rosuvastatin therapy with an LDL of 9 in January.  LFTs were normal in June.  5.  History of strokes: Chronic debilitation with sedentary lifestyle.  Chronically anticoagulated with Eliquis in setting of #3.  6.  Anemia: Recent labs showed H&H 9.62.8 with low normal iron studies.  Pending hematology follow-up per primary care.  7.  Type 2 diabetes mellitus: A1c 7.7.  She is on metformin and Januvia.  Per primary care.  Consider addition of SGLT2 inhibitor in the setting of diastolic heart failure.  8.  Disposition: Basic metabolic panel today.  Follow-up in 1 month or sooner if necessary.   Murray Hodgkins, NP 02/06/2021, 11:20 AM

## 2021-02-06 NOTE — Telephone Encounter (Signed)
I called and spoke with the patient and gave her instructions, I also faxed the labs that dr. Olivia Mackie gave me to Danna Hefty, NP and I sent the lab to scan.  Patient understood instructions.  Eugune Sine,cma

## 2021-02-06 NOTE — Telephone Encounter (Signed)
Call routed to provider's box no need to fax. Will fax original lab report to their office when back in office.

## 2021-02-06 NOTE — Patient Instructions (Signed)
Medication Instructions:  No changes at this time.  *If you need a refill on your cardiac medications before your next appointment, please call your pharmacy*   Lab Work: Bmet today  If you have labs (blood work) drawn today and your tests are completely normal, you will receive your results only by: [place] (if you have MyChart) OR A paper copy in the mail If you have any lab test that is abnormal or we need to change your treatment, we will call you to review the results.   Testing/Procedures: None   Follow-Up: At [facility], you and your health needs are our priority.  As part of our continuing mission to provide you with exceptional heart care, we have created designated Provider Care Teams.  These Care Teams include your primary Cardiologist (physician) and Advanced Practice Providers (APPs -  Physician Assistants and Nurse Practitioners) who all work together to provide you with the care you need, when you need it.   Your next appointment:   1 month(s)  The format for your next appointment:   In Person  Provider:   Kathlyn Sacramento, MD or Murray Hodgkins, NP

## 2021-02-07 DIAGNOSIS — I872 Venous insufficiency (chronic) (peripheral): Secondary | ICD-10-CM | POA: Diagnosis not present

## 2021-02-07 DIAGNOSIS — I4819 Other persistent atrial fibrillation: Secondary | ICD-10-CM | POA: Diagnosis not present

## 2021-02-07 DIAGNOSIS — E1152 Type 2 diabetes mellitus with diabetic peripheral angiopathy with gangrene: Secondary | ICD-10-CM | POA: Diagnosis not present

## 2021-02-07 DIAGNOSIS — J449 Chronic obstructive pulmonary disease, unspecified: Secondary | ICD-10-CM | POA: Diagnosis not present

## 2021-02-07 DIAGNOSIS — I69319 Unspecified symptoms and signs involving cognitive functions following cerebral infarction: Secondary | ICD-10-CM | POA: Diagnosis not present

## 2021-02-07 DIAGNOSIS — G8194 Hemiplegia, unspecified affecting left nondominant side: Secondary | ICD-10-CM | POA: Diagnosis not present

## 2021-02-07 DIAGNOSIS — E1159 Type 2 diabetes mellitus with other circulatory complications: Secondary | ICD-10-CM | POA: Diagnosis not present

## 2021-02-07 DIAGNOSIS — E1142 Type 2 diabetes mellitus with diabetic polyneuropathy: Secondary | ICD-10-CM | POA: Diagnosis not present

## 2021-02-07 DIAGNOSIS — I152 Hypertension secondary to endocrine disorders: Secondary | ICD-10-CM | POA: Diagnosis not present

## 2021-02-07 DIAGNOSIS — I69320 Aphasia following cerebral infarction: Secondary | ICD-10-CM | POA: Diagnosis not present

## 2021-02-07 LAB — BASIC METABOLIC PANEL
BUN/Creatinine Ratio: 17 (ref 12–28)
BUN: 20 mg/dL (ref 8–27)
CO2: 28 mmol/L (ref 20–29)
Calcium: 9.1 mg/dL (ref 8.7–10.3)
Chloride: 101 mmol/L (ref 96–106)
Creatinine, Ser: 1.15 mg/dL — ABNORMAL HIGH (ref 0.57–1.00)
Glucose: 197 mg/dL — ABNORMAL HIGH (ref 70–99)
Potassium: 4.4 mmol/L (ref 3.5–5.2)
Sodium: 145 mmol/L — ABNORMAL HIGH (ref 134–144)
eGFR: 51 mL/min/{1.73_m2} — ABNORMAL LOW (ref >59)

## 2021-02-09 NOTE — Progress Notes (Signed)
PROVIDER NOTE: Information contained herein reflects review and annotations entered in association with encounter. Interpretation of such information and data should be left to medically-trained personnel. Information provided to patient can be located elsewhere in the medical record under "Patient Instructions". Document created using STT-dictation technology, any transcriptional errors that may result from process are unintentional.    Patient: [name]  Service Category: E/M  Provider: Gaspar Cola, MD  DOB: 1950/08/14  DOS: 02/10/2021  Specialty: Interventional Pain Management  MRN: [id number]  Setting: Ambulatory outpatient  PCP: [name], [name], MD  Type: Established Patient    Referring Provider: Orland Mustard *  Location: Office  Delivery: Face-to-face     HPI  [name], a 70 y.o. year old adult, is here today because of her Meralgia paresthetica of right side [G57.11]. [name] primary complain today is Arm Pain (Left) Last encounter: My last encounter with her was on 11/13/2020. Pertinent problems: [name] has Diabetic peripheral neuropathy associated with type 2 diabetes mellitus ([place]); Other shoulder lesions (Right); Neuropathy of lateral femoral cutaneous nerve (Right); Arthralgia; Osteoarthritis involving multiple joints; Status post reverse total shoulder replacement; Chronic pain syndrome; Chronic shoulder pain (Right); Carpal tunnel syndrome; Cervical radiculitis; Cubital tunnel syndrome (Right); Impingement syndrome of shoulder region; Pain in joint involving ankle and foot; Spinal stenosis of thoracic region; Hip joint pain (Left); Abnormality of gait and mobility; Diabetic ulcer of left foot ([place]); Muscle strain of hip (Left); Hip strain (Right); Ischial pain, right; Fibromyalgia syndrome; Chronic musculoskeletal pain; Peripheral vascular disease ([place]); Chronic ulcer of right leg ([place]); Meralgia paresthetica (Right); Chronic pain of  right ankle; Arthritis; Left ankle pain; Chronic shoulder pain (Left); Left hemiparesis ([place]); Primary osteoarthritis of left shoulder; Rotator cuff disorder, left; Ulcer of left heel and midfoot (HCC); PAD (peripheral artery disease) ([place]); Peripheral neurogenic pain; Chronic peripheral neuropathic pain; Chronic shoulder pain (Bilateral); Impaired range of motion of shoulder (Left); and Acute pain of shoulder (Left) on their pertinent problem list. Pain Assessment: Severity of Chronic pain is reported as a 8 /10. Location: Arm Left/Starts from left shoulder into elbow (pain is worse behind the arm).. Onset: More than a month ago. Quality: Constant, Throbbing. Timing: Constant. Modifying factor(s): Medications help ease but does not take pain away. Vitals:  height is 5' 5.5" (1.664 m) and weight is 255 lb (115.7 kg). Her temporal temperature is 97.4 F (36.3 C) (abnormal). Her blood pressure is 160/68 (abnormal) and her pulse is 64. Her respiration is 20 and oxygen saturation is 100%.   Reason for encounter: medication management.   The patient indicates doing well with the current medication regimen. No adverse reactions or side effects reported to the medications.  The patient indicates lately having more pain secondary to shoulder problems.  On 01/31/2021 she had an MRI of the left shoulder which reads:  LEFT SHOULDER MRI IMPRESSION: 1. Severe osteoarthritis of the left glenohumeral joint. 2. Moderate tendinosis of the supraspinatus tendon with a small partial-thickness articular surface tear.  Physical exam today shows the patient to have decreased range of motion of the left shoulder with pain.  Today the patient had a significant amount of questions regarding her left shoulder and what to do to get it "fixed".  The first thing that I did was to make sure that she understood that I could not "fix her shoulder".  I answered all of the questions that she had pertaining her pain and what was said that  I can do  for her, but she had many questions that need to be answered by a surgeon and perhaps an anesthesiologist that may be providing her with anesthesia for her shoulder surgery, if the surgeon was to the side to do a shoulder surgery.  However, I make sure that the patient understood that in her particular case, the risk is very high secondary to all of her medical problems.  Even regarding what I can offer the patient, I was sure to remind her that I can only do the injections if she can come off of the Eliquis in a safe manner.  However, she already has a history of having had a stroke when she came off of her blood thinners for the purpose of having an endoscopy.  She was reminded that this could happen again if and when she comes off of the blood thinners.  RTCB: 05/12/2021 Nonopioids transferred 02/19/2020: Lyrica and Cymbalta  Pharmacotherapy Assessment  Analgesic: Hydrocodone/APAP 5/325 mg, 1 tab PO q 8 hours (15 mg/day of hydrocodone) MME/day: 15 mg/day.   Monitoring: [place] PMP: PDMP reviewed during this encounter.       Pharmacotherapy: No side-effects or adverse reactions reported. Compliance: No problems identified. Effectiveness: Clinically acceptable.  Al Decant, RN  02/10/2021  3:49 PM  Sign when Signing Visit Safety precautions to be maintained throughout the outpatient stay will include: orient to surroundings, keep bed in low position, maintain call bell within reach at all times, provide assistance with transfer out of bed and ambulation.   Nursing Pain Medication Assessment:  Safety precautions to be maintained throughout the outpatient stay will include: orient to surroundings, keep bed in low position, maintain call bell within reach at all times, provide assistance with transfer out of bed and ambulation.  Medication Inspection Compliance: [name] did not comply with our request to bring her pills to be counted. She was reminded that bringing the medication bottles,  even when empty, is a requirement.  Medication: None brought in. Pill/Patch Count: None available to be counted. Bottle Appearance: No container available. Did not bring bottle(s) to appointment. Filled Date: N/A Last Medication intake:  Today  Patient states she is due refill today - which she states she still has 1 refill left at the pharmacy. Today she took the last pill of previous refill.   Called [facility] pharmacy per provider request to cancel last e-Rx refill she has left as provider sent e-Rx for 02/11/21 pick up. Patient aware to pick up prescription tomorrow and that last refill was cancelled.   Al Decant, RN      UDS:  Summary  Date Value Ref Range Status  11/13/2020 Note  Final    Comment:    ==================================================================== [facility] (MW) ==================================================================== Test                             Result       Flag       Units  Drug Present and Declared for Prescription Verification   Alprazolam                     32           EXPECTED   ng/mg creat   Alpha-hydroxyalprazolam        58           EXPECTED   ng/mg creat    Source of alprazolam is a scheduled prescription medication. Alpha-  hydroxyalprazolam is an expected metabolite of alprazolam.    Hydrocodone                    268          EXPECTED   ng/mg creat   Dihydrocodeine                 93           EXPECTED   ng/mg creat   Norhydrocodone                 401          EXPECTED   ng/mg creat    Sources of hydrocodone include scheduled prescription medications.    Dihydrocodeine and norhydrocodone are expected metabolites of    hydrocodone. Dihydrocodeine is also available as a scheduled    prescription medication.  ==================================================================== Test                      Result    Flag   Units      Ref Range   Creatinine              88               mg/dL       >=20 ==================================================================== Declared Medications:  The flagging and interpretation on this report are based on the  following declared medications.  Unexpected results may arise from  inaccuracies in the declared medications.   **Note: The testing scope of this panel includes these medications:   Alprazolam (Xanax)  Hydrocodone (Norco)   **Note: The testing scope of this panel does not include the  following reported medications:   Acetaminophen (Tylenol)  Acetaminophen (Norco)  Albuterol (Proventil HFA)  Apixaban (Eliquis)  Bupropion (Wellbutrin XL)  Calcium  Cholecalciferol  Clobetasol  Colchicine  Cyanocobalamin  Diclofenac (Voltaren)  Doxycycline  Duloxetine (Cymbalta)  Estradiol (Estrace)  Fesoterodine (Toviaz)  Fluticasone (Advair)  Folic Acid  Levocetirizine (Xyzal)  Levothyroxine (Synthroid)  Metformin (Glucophage)  Metoprolol (Toprol)  Montelukast (Singulair)  Multivitamin  Mupirocin (Bactroban)  Nitrofurantoin (Macrobid)  Ondansetron (Zofran)  Oxybutynin (Ditropan)  Pantoprazole (Protonix)  Pregabalin (Lyrica)  Rosuvastatin (Crestor)  Salmeterol (Advair)  Sitagliptin (Januvia)  Sucralfate (Carafate)  Sulfamethoxazole (Bactrim)  Thiamine  Topical  Trimethoprim (Bactrim)  Vitamin D  Vitamin D2 ==================================================================== For clinical consultation, please call [phone]. ====================================================================      ROS  Constitutional: Denies any fever or chills Gastrointestinal: No reported hemesis, hematochezia, vomiting, or acute GI distress Musculoskeletal: Denies any acute onset joint swelling, redness, loss of ROM, or weakness Neurological: No reported episodes of acute onset apraxia, aphasia, dysarthria, agnosia, amnesia, paralysis, loss of coordination, or loss of consciousness  Medication Review  ALPRAZolam,  DULoxetine, HYDROcodone-acetaminophen, Tub Transfer Board, Vitamin D3, acetaminophen, albuterol, apixaban, buPROPion, calcium-vitamin D, cephALEXin, clobetasol cream, colchicine, collagenase, cyanocobalamin, diclofenac Sodium, doxycycline, ergocalciferol, estradiol, fesoterodine, fluconazole, fluticasone-salmeterol, folic acid, furosemide, levocetirizine, levothyroxine, losartan, metFORMIN, metoprolol succinate, montelukast, multivitamin with minerals, mupirocin ointment, ondansetron, oxybutynin, pantoprazole, pregabalin, rosuvastatin, sitaGLIPtin, sucralfate, and thiamine  History Review  Allergy: [name] is allergic to morphine and related, penicillins, ambien [zolpidem], aspirin, and oxytetracycline. Drug: [name]  reports no history of drug use. Alcohol:  reports no history of alcohol use. Tobacco:  reports that she has never smoked. She has never used smokeless tobacco. Social: [name]  reports that she has never smoked. She has never used smokeless tobacco. She reports that she does  not drink alcohol and does not use drugs. Medical:  has a past medical history of Abnormal antibody titer, Anxiety and depression, Arthritis, Asthma, Atypical chest pain, Carotid arterial disease ([place]), Cystocele, Depression, Diabetes ([place]), DVT (deep venous thrombosis) ([place]), Dyspnea, Edema, GERD (gastroesophageal reflux disease), Heart murmur, History of IBS, History of kidney stones, History of methicillin resistant staphylococcus aureus (MRSA) (2008), History of multiple strokes, Hyperlipidemia, Hypertension, Hypothyroidism, Insomnia, Kidney stone, Neuropathy involving both lower extremities, Non-healing ulcer of left foot ([place]), Obesity, Class II, BMI 35-39.9, with comorbidity, PAF (paroxysmal atrial fibrillation) ([place]), Peripheral vascular disease ([place]), Sleep apnea, Urinary incontinence, and Venous stasis dermatitis of both lower extremities. Surgical: [name]  has a past surgical history that  includes Ankle surgery (Right); Vaginal hysterectomy; Sleeve Gastroplasty; Lithotripsy; Kidney surgery (Right); transthoracic echocardiogram (08/03/2013); transesophageal echocardiogram (08/08/2013); Tonsillectomy; Total knee arthroplasty (Left); Hiatal hernia repair; Cholecystectomy; I & D extremity (Left, 06/27/2015); Application if wound vac (Left, 06/27/2015); removal of left hematoma (Left); NM GATED MYOVIEW [facility] HX) (February 2017); Reverse shoulder arthroplasty (Right, 10/08/2015); Hernia repair; Ulnar nerve transposition (Right, 08/06/2016); arm surgery; Irrigation and debridement abscess (Left, 09/07/2016); Incision and drainage of wound (Left, 09/25/2016); Application if wound vac (Left, 09/25/2016); Colonoscopy; Upper gi endoscopy; Esophagogastroduodenoscopy (egd) with propofol (N/A, 01/11/2017); Colonoscopy with propofol (N/A, 01/11/2017); Colonoscopy with propofol (N/A, 02/22/2017); OTHER SURGICAL HISTORY; Total shoulder replacement (Right); Joint replacement (2013); Colonoscopy with propofol (N/A, 05/31/2017); Lower Extremity Angiography (Left, 04/04/2018); Esophagogastroduodenoscopy (egd) with propofol (N/A, 10/25/2018); Incision and drainage of wound (Left, 11/09/2018); and Esophagogastroduodenoscopy (egd) with propofol (N/A, 11/29/2019). Family: family history includes Alcohol abuse in her father; Arthritis in her maternal grandfather, maternal grandmother, mother, paternal grandfather, and paternal grandmother; Breast cancer in her maternal aunt; Cancer in her father and maternal aunt; Cerebral aneurysm in her father; Diabetes in her father, maternal grandmother, and paternal grandmother; Heart disease in her maternal grandfather; Hypertension in her father, maternal grandfather, maternal grandmother, paternal grandfather, and paternal grandmother; Kidney disease in her mother; Mental illness in her sister; Peripheral vascular disease in her father; Skin cancer in her father; Varicose Veins in her  mother.  Laboratory Chemistry Profile   Renal Lab Results  Component Value Date   BUN 20 02/06/2021   CREATININE 1.15 (H) 02/06/2021   LABCREA 48 08/22/2020   BCR 17 02/06/2021   GFR 46.13 (L) 10/15/2020   GFRAA 58 (L) 12/25/2019   GFRNONAA >60 04/14/2020    Hepatic Lab Results  Component Value Date   AST 15 08/22/2020   ALT 10 08/22/2020   ALBUMIN 3.9 08/22/2020   ALKPHOS 100 08/22/2020   LIPASE 27 07/28/2018   AMMONIA <9 (L) 02/07/2020    Electrolytes Lab Results  Component Value Date   NA 145 (H) 02/06/2021   K 4.4 02/06/2021   CL 101 02/06/2021   CALCIUM 9.1 02/06/2021   MG 2.1 04/14/2020   PHOS 4.1 04/14/2020    Bone Lab Results  Component Value Date   VD25OH 35.97 08/22/2020    Inflammation (CRP: Acute Phase) (ESR: Chronic Phase) Lab Results  Component Value Date   CRP 3.1 (H) 04/08/2020   ESRSEDRATE 60 (H) 04/08/2020   LATICACIDVEN 1.1 12/20/2019         Note: Above Lab results reviewed.  Recent Imaging Review  MR Shoulder Left Wo Contrast CLINICAL DATA:  Left shoulder pain with limited range of motion since stroke in 2019. Rotator cuff disorder suspected.  EXAM: MRI OF THE LEFT SHOULDER WITHOUT CONTRAST  TECHNIQUE: Multiplanar, multisequence MR imaging  of the shoulder was performed. No intravenous contrast was administered.  COMPARISON:  CT upper extremity 12/21/2019.  FINDINGS: Limited evaluation secondary to large body habitus and patient motion degrading image quality.  Rotator cuff: Moderate tendinosis of the supraspinatus tendon with a small partial-thickness articular surface tear. Infraspinatus tendon is intact. Teres minor tendon is intact. Subscapularis tendon is intact.  Muscles: No atrophy or abnormal signal of the muscles of the rotator cuff.  Biceps long head: Mild tendinosis of the intra-articular portion of the long head of the biceps tendon.  Acromioclavicular Joint: Os acromiale. Small amount of fluid across the  synchondrosis. Mild arthropathy of the acromioclavicular joint. No subacromial/subdeltoid bursal fluid.  Glenohumeral Joint: Small joint effusion. Full-thickness cartilage loss of the glenohumeral joint with marginal osteophytes.  Labrum: Grossly intact, but evaluation is limited by lack of intraarticular fluid.  Bones:  No acute osseous abnormality.  No aggressive osseous lesion.  Other: None.  IMPRESSION: 1. Severe osteoarthritis of the left glenohumeral joint. 2. Moderate tendinosis of the supraspinatus tendon with a small partial-thickness articular surface tear.  Electronically Signed   By: Kathreen Devoid M.D.   On: 01/31/2021 09:27 Note: Reviewed        Physical Exam  General appearance: Well nourished, well developed, and well hydrated. In no apparent acute distress Mental status: Alert, oriented x 3 (person, place, & time)       Respiratory: No evidence of acute respiratory distress Eyes: PERLA Vitals: BP (!) 160/68   Pulse 64   Temp (!) 97.4 F (36.3 C) (Temporal)   Resp 20   Ht 5' 5.5" (1.664 m)   Wt 255 lb (115.7 kg)   SpO2 100%   BMI 41.79 kg/m  BMI: Estimated body mass index is 41.79 kg/m as calculated from the following:   Height as of this encounter: 5' 5.5" (1.664 m).   Weight as of this encounter: 255 lb (115.7 kg). Ideal: Ideal body weight: 58.2 kg (128 lb 3.2 oz) Adjusted ideal body weight: 81.2 kg (178 lb 14.7 oz)  Assessment   Status Diagnosis  Controlled Controlled Controlled 1. Meralgia paresthetica (Right)   2. Chronic shoulder pain (Bilateral)   3. Fibromyalgia syndrome   4. Left hemiparesis ([place])   5. Osteoarthritis involving multiple joints   6. Chronic pain syndrome   7. Pharmacologic therapy   8. Chronic use of opiate for therapeutic purpose   9. Encounter for medication management   10. Chronic anticoagulation (Eliquis)   11. Chronic shoulder pain (Left)   12. Impaired range of motion of shoulder (Left)   13. Acute pain of  shoulder (Left)      Updated Problems: Problem  Chronic shoulder pain (Bilateral)  Impaired range of motion of shoulder (Left)  Acute pain of shoulder (Left)  Chronic shoulder pain (Left)  Meralgia paresthetica (Right)  Hip strain (Right)  Hip joint pain (Left)  Muscle strain of hip (Left)  Cubital tunnel syndrome (Right)  Chronic shoulder pain (Right)  Neuropathy of lateral femoral cutaneous nerve (Right)    Plan of Care  Problem-specific:  No problem-specific Assessment & Plan notes found for this encounter.  [name] has a current medication list which includes the following long-term medication(s): albuterol, eliquis, bupropion, calcium-vitamin d, colchicine, duloxetine, advair hfa, furosemide, [START ON 02/11/2021] hydrocodone-acetaminophen, [START ON 03/13/2021] hydrocodone-acetaminophen, [START ON 04/12/2021] hydrocodone-acetaminophen, levocetirizine, levothyroxine, losartan, metformin, metoprolol succinate, montelukast, pregabalin, rosuvastatin, and sitagliptin.  Pharmacotherapy (Medications Ordered): Meds ordered this encounter  Medications   HYDROcodone-acetaminophen (NORCO/VICODIN)  5-325 MG tablet    Sig: Take 1 tablet by mouth 2 (two) times daily as needed for severe pain. Must last 30 days    Dispense:  45 tablet    Refill:  0    DO NOT: delete (not duplicate); no partial-fill (will deny script to complete), no refill request (F/U required). DISPENSE: 1 day early if closed on fill date. WARN: No CNS-depressants within 8 hrs of med.   HYDROcodone-acetaminophen (NORCO/VICODIN) 5-325 MG tablet    Sig: Take 1 tablet by mouth 2 (two) times daily as needed for severe pain. Must last 30 days    Dispense:  45 tablet    Refill:  0    DO NOT: delete (not duplicate); no partial-fill (will deny script to complete), no refill request (F/U required). DISPENSE: 1 day early if closed on fill date. WARN: No CNS-depressants within 8 hrs of med.   HYDROcodone-acetaminophen  (NORCO/VICODIN) 5-325 MG tablet    Sig: Take 1 tablet by mouth 2 (two) times daily as needed for severe pain. Must last 30 days    Dispense:  45 tablet    Refill:  0    DO NOT: delete (not duplicate); no partial-fill (will deny script to complete), no refill request (F/U required). DISPENSE: 1 day early if closed on fill date. WARN: No CNS-depressants within 8 hrs of med.    Orders:  Orders Placed This Encounter  Procedures   Blood Thinner Instructions to Nursing    Always make sure patient has clearance from prescribing physician to stop blood thinners for interventional therapies. If the patient requires a Lovenox-bridge therapy, make sure arrangements are made to institute it with the assistance of the PCP.    Scheduling Instructions:     Have [name] stop the Eliquis (Apixaban) x 3 days prior to procedure or surgery.   Blood Thinner Instructions to Nursing    If unable to stop, ask if Lovenox-bridge therapy may be possible, and if so, request their assistance in implementing it.    Scheduling Instructions:     Contact the physician prescribing the blood thinner and request clearance to stop it for time period stipulated below.     If approved by prescribing physician, stop Eliquis (Apixaban) x 3 days prior to procedure or surgery.    Follow-up plan:   Return for [facility]) procedure: (L) Shoulder inj. , (NS).      Interventional Therapies  Risk  Complexity Considerations:   Estimated body mass index is 41.79 kg/m as calculated from the following:   Height as of this encounter: 5' 5.5" (1.664 m).   Weight as of this encounter: 255 lb (115.7 kg). NOTE: Eliquis ANTICOAGULATION (Stop: 3 days  Restart: 6 hours) NO LUMBAR RFA until BMI < 35.   Planned  Pending:   Pending further evaluation   Under consideration:   Diagnostic right suprascapular NB Diagnostic right IA knee injection  Possible series of 5 Hyalgan knee injections Diagnostic right-sided genicular NB Possible  right-sided genicular nerve RFA  Diagnostic right-sided suprascapular NB  Possible right-sided suprascapular nerve RFA    Completed:   None at this time   Therapeutic  Palliative (PRN) options:   None at this time    Recent Visits Date Type Provider Dept  11/13/20 Office Visit Milinda Pointer, MD [facility]  Showing recent visits within past 90 days and meeting all other requirements Today's Visits Date Type Provider Dept  02/10/21 Office Visit Milinda Pointer, MD [facility]  Showing today's visits and meeting all other requirements Future Appointments Date Type Provider Dept  05/07/21 Appointment Milinda Pointer, MD [facility]  Showing future appointments within next 90 days and meeting all other requirements I discussed the assessment and treatment plan with the patient. The patient was provided an opportunity to ask questions and all were answered. The patient agreed with the plan and demonstrated an understanding of the instructions.  Patient advised to call back or seek an in-person evaluation if the symptoms or condition worsens.  Duration of encounter: 35 minutes.  Note by: Gaspar Cola, MD Date: 02/10/2021; Time: 3:50 PM

## 2021-02-10 ENCOUNTER — Other Ambulatory Visit: Payer: Self-pay

## 2021-02-10 ENCOUNTER — Ambulatory Visit: Payer: Medicare HMO | Attending: Pain Medicine | Admitting: Pain Medicine

## 2021-02-10 ENCOUNTER — Encounter: Payer: Self-pay | Admitting: Pain Medicine

## 2021-02-10 ENCOUNTER — Telehealth: Payer: Self-pay

## 2021-02-10 VITALS — BP 160/68 | HR 64 | Temp 97.4°F | Resp 20 | Ht 65.5 in | Wt 255.0 lb

## 2021-02-10 DIAGNOSIS — E1142 Type 2 diabetes mellitus with diabetic polyneuropathy: Secondary | ICD-10-CM | POA: Diagnosis not present

## 2021-02-10 DIAGNOSIS — M25511 Pain in right shoulder: Secondary | ICD-10-CM | POA: Diagnosis not present

## 2021-02-10 DIAGNOSIS — I152 Hypertension secondary to endocrine disorders: Secondary | ICD-10-CM | POA: Diagnosis not present

## 2021-02-10 DIAGNOSIS — E1159 Type 2 diabetes mellitus with other circulatory complications: Secondary | ICD-10-CM | POA: Diagnosis not present

## 2021-02-10 DIAGNOSIS — M25512 Pain in left shoulder: Secondary | ICD-10-CM | POA: Insufficient documentation

## 2021-02-10 DIAGNOSIS — Z79899 Other long term (current) drug therapy: Secondary | ICD-10-CM | POA: Insufficient documentation

## 2021-02-10 DIAGNOSIS — J449 Chronic obstructive pulmonary disease, unspecified: Secondary | ICD-10-CM | POA: Diagnosis not present

## 2021-02-10 DIAGNOSIS — G5711 Meralgia paresthetica, right lower limb: Secondary | ICD-10-CM | POA: Diagnosis not present

## 2021-02-10 DIAGNOSIS — M159 Polyosteoarthritis, unspecified: Secondary | ICD-10-CM | POA: Diagnosis not present

## 2021-02-10 DIAGNOSIS — Z79891 Long term (current) use of opiate analgesic: Secondary | ICD-10-CM | POA: Diagnosis not present

## 2021-02-10 DIAGNOSIS — M797 Fibromyalgia: Secondary | ICD-10-CM | POA: Insufficient documentation

## 2021-02-10 DIAGNOSIS — M25612 Stiffness of left shoulder, not elsewhere classified: Secondary | ICD-10-CM | POA: Insufficient documentation

## 2021-02-10 DIAGNOSIS — G8194 Hemiplegia, unspecified affecting left nondominant side: Secondary | ICD-10-CM | POA: Insufficient documentation

## 2021-02-10 DIAGNOSIS — G894 Chronic pain syndrome: Secondary | ICD-10-CM | POA: Insufficient documentation

## 2021-02-10 DIAGNOSIS — G8929 Other chronic pain: Secondary | ICD-10-CM | POA: Insufficient documentation

## 2021-02-10 DIAGNOSIS — Z7901 Long term (current) use of anticoagulants: Secondary | ICD-10-CM | POA: Diagnosis present

## 2021-02-10 DIAGNOSIS — I69319 Unspecified symptoms and signs involving cognitive functions following cerebral infarction: Secondary | ICD-10-CM | POA: Diagnosis not present

## 2021-02-10 DIAGNOSIS — I872 Venous insufficiency (chronic) (peripheral): Secondary | ICD-10-CM | POA: Diagnosis not present

## 2021-02-10 DIAGNOSIS — I4819 Other persistent atrial fibrillation: Secondary | ICD-10-CM | POA: Diagnosis not present

## 2021-02-10 DIAGNOSIS — E1152 Type 2 diabetes mellitus with diabetic peripheral angiopathy with gangrene: Secondary | ICD-10-CM | POA: Diagnosis not present

## 2021-02-10 DIAGNOSIS — I69320 Aphasia following cerebral infarction: Secondary | ICD-10-CM | POA: Diagnosis not present

## 2021-02-10 MED ORDER — HYDROCODONE-ACETAMINOPHEN 5-325 MG PO TABS: 1.0000 | ORAL_TABLET | Freq: Two times a day (BID) | ORAL | 0 refills | Status: DC | PRN

## 2021-02-10 NOTE — Patient Instructions (Signed)
______________________________________________________________________  Preparing for your procedure (without sedation)  Procedure appointments are limited to planned procedures: No Prescription Refills. No disability issues will be discussed. No medication changes will be discussed.  Instructions: Oral Intake: Do not eat or drink anything for at least 6 hours prior to your procedure. (Exception: Blood Pressure Medication. See below.) Transportation: Unless otherwise stated by your physician, you may drive yourself after the procedure. Blood Pressure Medicine: Do not forget to take your blood pressure medicine with a sip of water the morning of the procedure. If your Diastolic (lower reading)is above 100 mmHg, elective cases will be cancelled/rescheduled. Blood thinners: These will need to be stopped for procedures. Notify our staff if you are taking any blood thinners. Depending on which one you take, there will be specific instructions on how and when to stop it. Diabetics on insulin: Notify the staff so that you can be scheduled 1st case in the morning. If your diabetes requires high dose insulin, take only  of your normal insulin dose the morning of the procedure and notify the staff that you have done so. Preventing infections: Shower with an antibacterial soap the morning of your procedure.  Build-up your immune system: Take 1000 mg of Vitamin C with every meal (3 times a day) the day prior to your procedure. Antibiotics: Inform the staff if you have a condition or reason that requires you to take antibiotics before dental procedures. Pregnancy: If you are pregnant, call and cancel the procedure. Sickness: If you have a cold, fever, or any active infections, call and cancel the procedure. Arrival: You must be in the facility at least 30 minutes prior to your scheduled procedure. Children: Do not bring any children with you. Dress appropriately: Bring dark clothing that you would not mind  if they get stained. Valuables: Do not bring any jewelry or valuables.  Reasons to call and reschedule or cancel your procedure: (Following these recommendations will minimize the risk of a serious complication.) Surgeries: Avoid having procedures within 2 weeks of any surgery. (Avoid for 2 weeks before or after any surgery). Flu Shots: Avoid having procedures within 2 weeks of a flu shots or . (Avoid for 2 weeks before or after immunizations). Barium: Avoid having a procedure within 7-10 days after having had a radiological study involving the use of radiological contrast. (Myelograms, Barium swallow or enema study). Heart attacks: Avoid any elective procedures or surgeries for the initial 6 months after a "Myocardial Infarction" (Heart Attack). Blood thinners: It is imperative that you stop these medications before procedures. Let us know if you if you take any blood thinner.  Infection: Avoid procedures during or within two weeks of an infection (including chest colds or gastrointestinal problems). Symptoms associated with infections include: Localized redness, fever, chills, night sweats or profuse sweating, burning sensation when voiding, cough, congestion, stuffiness, runny nose, sore throat, diarrhea, nausea, vomiting, cold or Flu symptoms, recent or current infections. It is specially important if the infection is over the area that we intend to treat. Heart and lung problems: Symptoms that may suggest an active cardiopulmonary problem include: cough, chest pain, breathing difficulties or shortness of breath, dizziness, ankle swelling, uncontrolled high or unusually low blood pressure, and/or palpitations. If you are experiencing any of these symptoms, cancel your procedure and contact your primary care physician for an evaluation.  Remember:  Regular Business hours are:  Monday to Thursday 8:00 AM to 4:00 PM  Provider's Schedule: Amando Chaput, MD:  Procedure days: Tuesday and Thursday    7:30 AM to 4:00 PM  Gillis Santa, MD:  Procedure days: Monday and Wednesday 7:30 AM to 4:00 PM ______________________________________________________________________  ____________________________________________________________________________________________  Blood Thinners  IMPORTANT NOTICE:  If you take any of these, make sure to notify the nursing staff.  Failure to do so may result in injury.  Recommended time intervals to stop and restart blood-thinners, before & after invasive procedures  Generic Name Brand Name Pre-procedure. Stop this long before procedure. Post-procedure. Minimum waiting period before restarting.  Abciximab Reopro 15 days 2 hrs  Alteplase Activase 10 days 10 days  Anagrelide Agrylin    Apixaban Eliquis 3 days 6 hrs  Cilostazol Pletal 3 days 5 hrs  Clopidogrel Plavix 7-10 days 2 hrs  Dabigatran Pradaxa 5 days 6 hrs  Dalteparin Fragmin 24 hours 4 hrs  Dipyridamole Aggrenox 11days 2 hrs  Edoxaban Lixiana; Savaysa 3 days 2 hrs  Enoxaparin  Lovenox 24 hours 4 hrs  Eptifibatide Integrillin 8 hours 2 hrs  Fondaparinux  Arixtra 72 hours 12 hrs  Hydroxychloroquine Plaquenil 11 days   Prasugrel Effient 7-10 days 6 hrs  Reteplase Retavase 10 days 10 days  Rivaroxaban Xarelto 3 days 6 hrs  Ticagrelor Brilinta 5-7 days 6 hrs  Ticlopidine Ticlid 10-14 days 2 hrs  Tinzaparin Innohep 24 hours 4 hrs  Tirofiban Aggrastat 8 hours 2 hrs  Warfarin Coumadin 5 days 2 hrs   Other medications with blood-thinning effects  Product indications Generic (Brand) names Note  Cholesterol Lipitor Stop 4 days before procedure  Blood thinner (injectable) Heparin (LMW or LMWH Heparin) Stop 24 hours before procedure  Cancer Ibrutinib (Imbruvica) Stop 7 days before procedure  Malaria/Rheumatoid Hydroxychloroquine (Plaquenil) Stop 11 days before procedure  Thrombolytics  10 days before or after procedures   Over-the-counter (OTC) Products with blood-thinning effects  Product  Common names Stop Time  Aspirin > 325 mg Goody Powders, Excedrin, etc. 11 days  Aspirin ? 81 mg  7 days  Fish oil  4 days  Garlic supplements  7 days  Ginkgo biloba  36 hours  Ginseng  24 hours  NSAIDs Ibuprofen, Naprosyn, etc. 3 days  Vitamin E  4 days   ____________________________________________________________________________________________ ____________________________________________________________________________________________  General Risks and Possible Complications  Patient Responsibilities: It is important that you read this as it is part of your informed consent. It is our duty to inform you of the risks and possible complications associated with treatments offered to you. It is your responsibility as a patient to read this and to ask questions about anything that is not clear or that you believe was not covered in this document.  Patient's Rights: You have the right to refuse treatment. You also have the right to change your mind, even after initially having agreed to have the treatment done. However, under this last option, if you wait until the last second to change your mind, you may be charged for the materials used up to that point.  Introduction: Medicine is not an Chief Strategy Officer. Everything in Medicine, including the lack of treatment(s), carries the potential for danger, harm, or loss (which is by definition: Risk). In Medicine, a complication is a secondary problem, condition, or disease that can aggravate an already existing one. All treatments carry the risk of possible complications. The fact that a side effects or complications occurs, does not imply that the treatment was conducted incorrectly. It must be clearly understood that these can happen even when everything is done following the highest safety standards.  No treatment: You can  choose not to proceed with the proposed treatment alternative. The "PRO(s)" would include: avoiding the risk of complications  associated with the therapy. The "CON(s)" would include: not getting any of the treatment benefits. These benefits fall under one of three categories: diagnostic; therapeutic; and/or palliative. Diagnostic benefits include: getting information which can ultimately lead to improvement of the disease or symptom(s). Therapeutic benefits are those associated with the successful treatment of the disease. Finally, palliative benefits are those related to the decrease of the primary symptoms, without necessarily curing the condition (example: decreasing the pain from a flare-up of a chronic condition, such as incurable terminal cancer).  General Risks and Complications: These are associated to most interventional treatments. They can occur alone, or in combination. They fall under one of the following six (6) categories: no benefit or worsening of symptoms; bleeding; infection; nerve damage; allergic reactions; and/or death. No benefits or worsening of symptoms: In Medicine there are no guarantees, only probabilities. No healthcare provider can ever guarantee that a medical treatment will work, they can only state the probability that it may. Furthermore, there is always the possibility that the condition may worsen, either directly, or indirectly, as a consequence of the treatment. Bleeding: This is more common if the patient is taking a blood thinner, either prescription or over the counter (example: Goody Powders, Fish oil, Aspirin, Garlic, etc.), or if suffering a condition associated with impaired coagulation (example: Hemophilia, cirrhosis of the liver, low platelet counts, etc.). However, even if you do not have one on these, it can still happen. If you have any of these conditions, or take one of these drugs, make sure to notify your treating physician. Infection: This is more common in patients with a compromised immune system, either due to disease (example: diabetes, cancer, human immunodeficiency virus  [HIV], etc.), or due to medications or treatments (example: therapies used to treat cancer and rheumatological diseases). However, even if you do not have one on these, it can still happen. If you have any of these conditions, or take one of these drugs, make sure to notify your treating physician. Nerve Damage: This is more common when the treatment is an invasive one, but it can also happen with the use of medications, such as those used in the treatment of cancer. The damage can occur to small secondary nerves, or to large primary ones, such as those in the spinal cord and brain. This damage may be temporary or permanent and it may lead to impairments that can range from temporary numbness to permanent paralysis and/or brain death. Allergic Reactions: Any time a substance or material comes in contact with our body, there is the possibility of an allergic reaction. These can range from a mild skin rash (contact dermatitis) to a severe systemic reaction (anaphylactic reaction), which can result in death. Death: In general, any medical intervention can result in death, most of the time due to an unforeseen complication. ____________________________________________________________________________________________

## 2021-02-10 NOTE — Progress Notes (Signed)
Safety precautions to be maintained throughout the outpatient stay will include: orient to surroundings, keep bed in low position, maintain call bell within reach at all times, provide assistance with transfer out of bed and ambulation.   Nursing Pain Medication Assessment:  Safety precautions to be maintained throughout the outpatient stay will include: orient to surroundings, keep bed in low position, maintain call bell within reach at all times, provide assistance with transfer out of bed and ambulation.  Medication Inspection Compliance: [name] did not comply with our request to bring her pills to be counted. She was reminded that bringing the medication bottles, even when empty, is a requirement.  Medication: None brought in. Pill/Patch Count: None available to be counted. Bottle Appearance: No container available. Did not bring bottle(s) to appointment. Filled Date: N/A Last Medication intake:  Today  Patient states she is due refill today - which she states she still has 1 refill left at the pharmacy. Today she took the last pill of previous refill.   Called [facility] pharmacy per provider request to cancel last e-Rx refill she has left as provider sent e-Rx for 02/11/21 pick up. Patient aware to pick up prescription tomorrow and that last refill was cancelled.   Al Decant, RN

## 2021-02-10 NOTE — Telephone Encounter (Signed)
As one can see from the chart the prescriber of eliquis is cardiology Dr. Fletcher Anon   Thank you

## 2021-02-10 NOTE — Telephone Encounter (Signed)
Dr Dossie Arbour would like clearance for this patient to stop Eliquis for 3 days for a shoulder injection.  Please let us know as soon as possible.  Thank you

## 2021-02-10 NOTE — Telephone Encounter (Signed)
Given high risk of stroke without anticoagulation and low bleeding risk with the procedure, recommend holding Eliquis for 2 days before the procedure instead of 3.

## 2021-02-11 ENCOUNTER — Encounter: Payer: Medicare HMO | Admitting: Physician Assistant

## 2021-02-11 DIAGNOSIS — L84 Corns and callosities: Secondary | ICD-10-CM | POA: Diagnosis not present

## 2021-02-11 DIAGNOSIS — E11621 Type 2 diabetes mellitus with foot ulcer: Secondary | ICD-10-CM | POA: Diagnosis not present

## 2021-02-11 DIAGNOSIS — X58XXXA Exposure to other specified factors, initial encounter: Secondary | ICD-10-CM | POA: Diagnosis not present

## 2021-02-11 DIAGNOSIS — M86572 Other chronic hematogenous osteomyelitis, left ankle and foot: Secondary | ICD-10-CM | POA: Diagnosis not present

## 2021-02-11 DIAGNOSIS — L97822 Non-pressure chronic ulcer of other part of left lower leg with fat layer exposed: Secondary | ICD-10-CM | POA: Diagnosis not present

## 2021-02-11 DIAGNOSIS — E1169 Type 2 diabetes mellitus with other specified complication: Secondary | ICD-10-CM | POA: Diagnosis not present

## 2021-02-11 DIAGNOSIS — L97812 Non-pressure chronic ulcer of other part of right lower leg with fat layer exposed: Secondary | ICD-10-CM | POA: Diagnosis not present

## 2021-02-11 DIAGNOSIS — L97522 Non-pressure chronic ulcer of other part of left foot with fat layer exposed: Secondary | ICD-10-CM | POA: Diagnosis not present

## 2021-02-11 DIAGNOSIS — I872 Venous insufficiency (chronic) (peripheral): Secondary | ICD-10-CM | POA: Diagnosis not present

## 2021-02-11 DIAGNOSIS — Z7901 Long term (current) use of anticoagulants: Secondary | ICD-10-CM | POA: Diagnosis not present

## 2021-02-11 NOTE — Progress Notes (Signed)
NYCOLE, KAWAHARA ([id number]) Visit Report for 02/11/2021 Chief Complaint Document Details Patient Name: [name], [name] Date of Service: 02/11/2021 10:00 AM Medical Record Number: [id number] Patient Account Number: [id number] Date of Birth/Sex: June 21, 1950 (70 y.o. F) Treating RN: Cornell Barman Primary Care Provider: [name], Olivia Mackie Other Clinician: Referring Provider: [name], Olivia Mackie Treating Provider/Extender: Skipper Cliche in Treatment: 32 Information Obtained from: Patient Chief Complaint Right Leg Ulcer and Right 4thToe Ulcer Electronic Signature(s) Signed: 02/11/2021 11:38:06 AM By: Worthy Keeler PA-C Entered By: Worthy Keeler on 02/11/2021 11:38:06 Cadet, [name] ([id number]) -------------------------------------------------------------------------------- Debridement Details Patient Name: [name] Date of Service: 02/11/2021 10:00 AM Medical Record Number: [id number] Patient Account Number: [id number] Date of Birth/Sex: [birth year] (70 y.o. F) Treating RN: Cornell Barman Primary Care Provider: [name], Olivia Mackie Other Clinician: Referring Provider: [name], Olivia Mackie Treating Provider/Extender: Skipper Cliche in Treatment: 32 Debridement Performed for Wound #16 Left,Distal Toe Fourth Assessment: Performed By: Physician Tommie Sams., PA-C Debridement Type: Debridement Severity of Tissue Pre Debridement: Fat layer exposed Level of Consciousness (Pre- Awake and Alert procedure): Pre-procedure Verification/Time Out Yes - 10:36 Taken: Total Area Debrided (L x W): 0.5 (cm) x 0.6 (cm) = 0.3 (cm) Tissue and other material Viable, Non-Viable, Eschar, Slough, Subcutaneous, Slough debrided: Level: Skin/Subcutaneous Tissue Debridement Description: Excisional Instrument: Curette Bleeding: Minimum Hemostasis Achieved: Pressure Response to Treatment: Procedure was tolerated well Level of Consciousness (Post- Awake and  Alert procedure): Post Debridement Measurements of Total Wound Length: (cm) 0.5 Width: (cm) 0.6 Depth: (cm) 0.2 Volume: (cm) 0.047 Character of Wound/Ulcer Post Debridement: Stable Severity of Tissue Post Debridement: Fat layer exposed Post Procedure Diagnosis Same as Pre-procedure Electronic Signature(s) Signed: 02/11/2021 4:45:38 PM By: Gretta Cool, BSN, RN, CWS, Kim RN, BSN Signed: 02/11/2021 5:02:50 PM By: Worthy Keeler PA-C Entered By: Gretta Cool, BSN, RN, CWS, Kim on 02/11/2021 10:37:00 [name], [name] ([id number]) -------------------------------------------------------------------------------- HPI Details Patient Name: [name] Date of Service: 02/11/2021 10:00 AM Medical Record Number: [id number] Patient Account Number: [id number] Date of Birth/Sex: 19-Jun-1950 (70 y.o. F) Treating RN: Cornell Barman Primary Care Provider: [name], Olivia Mackie Other Clinician: Referring Provider: [name], Olivia Mackie Treating Provider/Extender: Skipper Cliche in Treatment: 9 History of Present Illness HPI Description: 06/28/20 upon evaluation today patient appears to be doing unfortunately somewhat poorly in regard to wounds that she has over her lower extremities. She has a wound on the left distal second toe, left posterior heel, and right anterior lower leg. With that being said all in all none of the wounds appear to be too bad the one that hurts her the most is actually the wound on the posterior heel which actually is also one of the smallest. Nonetheless I think there is some callus hiding what is really going on in this area. Fortunately there does not appear to be any obvious evidence of infection which is great news. Patient does have a history of diabetes mellitus type 2, chronic venous insufficiency, hypertension, history of DVTs, and is on long-term anticoagulant therapy, Eliquis. 07/05/20 upon evaluation today patient appears to be doing well currently with regard to her  wounds. I feel like she is making progress which is great news and overall there is no signs of active infection at this time. No fevers, chills, nausea, vomiting, or diarrhea. 4/29; patient has a only a small area remaining on the tip of her left second toe and a small area remaining on the left heel. The area on the right anterior lower leg is healed. They tell me  that she had remote podiatric surgery on the toes of the left foot however they are very fixed in position and I wonder whether they are making it difficult to relieve pressure in her foot wear. She is a type II diabetic with neuropathy as well. 08/01/2020 upon evaluation today patient appears to be doing well currently in regard to her wounds. In fact everything appears to be doing much better. The posterior aspect of her heel is actually giving her some trouble not the Achilles area but a small wound that we did not even have marked. In fact I had noted this before. Nonetheless we are going to addressed that today. 5/27; most of the patient's wounds are healed including the left Achilles heel left toe. Right forearm is also healed. She has a new abrasion posteriorly in the right elbow area just above the olecranon this happened today with an abrasion from her car door 6/15; the patient's wounds on the left Achilles and left second toe remain healed. The area on the right forearm is also just about healed in fact the skin I replaced last time is looking viable which is good. She had me look at the left heel again which is not in the area of the wound more towards the tip of the heel at the Achilles insertion site as well as the tip of the left foot second toe. In the Achilles area that is clearly is friction probably in bed at night. Not really near where the wound was. The left second toe remains healed although this is hammered and overhanging first toe also puts pressure in this area 09/13/2020 upon evaluation today patient appears to be  doing well with regard to all previous wounds that she had. Everything is completely healed. With that being said I do think that the patient unfortunately has a new skin tear on the right forearm which is due to her put a Band-Aid on it and then when it pulled off this caused some issues with skin tearing. Nonetheless I think that this needs to be addressed today. Hopefully will take too long to get this to heal. She is having some issues with pain in her heel but I think this is more related to be honest to her neuropathy as opposed to anything else. Readmission: 10/30/2020 upon evaluation today patient appears to be doing poorly in regard to her legs bilaterally. She is also having an issue with her fourth toe on the left. Fortunately there does not appear to be any signs of active infection at this time. No fevers, chills, nausea, vomiting, or diarrhea. It is actually been quite a while since we have seen her. In fact June 24 was the last time that she was here in the clinic. She tells me she did not come back because things were just doing "pretty well". 11/29/2020 upon evaluation today patient actually appears to be doing well with regard to her arms those are completely healed. Her right leg is still open but doing okay. The fourth toe of the left foot does show some signs here of having some issues with necrotic tissue of an open wound. This is where a toenail was removed by podiatry. Subsequently I do think that we need to go ahead and see what we can do about getting this cleared away so being try to get some healing going forward. 12/13/2020 upon evaluation today patient appears to be doing decently well in regard to her wound on the right leg. In  general I think that she is making good progress here. Unfortunately she continues to have significant issues with swelling in the left leg is now even more swollen at this point. For that reason I do believe she would be benefited by wrapping both  sides although she is not interested in doing this. Overall I think that she is definitely making some good progress here nonetheless in regard to the right leg left leg leave some to be desired. 12/27/2020 upon evaluation today patient appears to be doing well with regard to her wound in regard to the legs. Unfortunately the toe was not doing nearly as well. I am much more concerned about infection and osteomyelitis here. She had the MRI but right now were not seeing obvious evidence of redness spreading up to her foot this is good news I do not have the result of the MRI as of yet. I am good to place her on doxycycline however due to the fact that the wound does seem to be getting a little worse in regard to her toe. 01/14/2021 upon evaluation today patient appears to be doing well with regard to her ulcers. The right leg is doing well and even the toe seems to be doing well though she still has some evidence of bone exposed on the toe which does not appear to be doing quite as good as I would like to see just and the fact that is still not covered over new tissue but looks tremendously better compared to 2 weeks ago. Overall very pleased in that regard. 01/28/2021 upon evaluation today patient's leg on the left actually appears to have new wounds this is due to her deciding to shave yesterday. Fortunately there does not appear to be any signs of infection currently. This is good news. Nonetheless I do believe that with these new areas open it would be best to wrap her leg to try to make sure we get this under control sooner rather than later. With regard to her toe there still does appear to be some evidence of bone exposed this was covered over with callus I did remove that today. Nonetheless I think this is significantly smaller than previous. 02/11/2021 upon evaluation today patient appears to be doing okay in regard to her leg ulcers. I do not see anything obvious at this point this seems to be  a complicating factor. I think that she is getting better. Were wrap of the right leg not the left leg. With that being said the left fourth Deese, Jocelin [name]. ([id number]) toe ulcer still does have evidence of being open there is some drainage still on top of the wound I think we can to try to see about using something like a collagen dressing today to see if that would be of benefit for her. She is in agreement with giving this a trial. Nonetheless I am concerned about the fact that she continues to have issues here with this not healing it is confirmed chronic osteomyelitis she has been on antibiotics. I am going to extend those today as well. That is Keflex and that will be for 1 additional month. Nonetheless if she is not doing significantly better by the next time I see her I think that the biggest thing is going to be for [place] to go ahead and see what we can do about getting her started with hyperbaric oxygen therapy to try to save the toe. Electronic Signature(s) Signed: 02/11/2021 11:43:54 AM By: Worthy Keeler PA-C  Entered By: Worthy Keeler on 02/11/2021 11:43:53 [name], [name] ([id number]) -------------------------------------------------------------------------------- Physical Exam Details Patient Name: [name], [name] Date of Service: 02/11/2021 10:00 AM Medical Record Number: [id number] Patient Account Number: [id number] Date of Birth/Sex: 1950/06/08 (70 y.o. F) Treating RN: Cornell Barman Primary Care Provider: [name], Olivia Mackie Other Clinician: Referring Provider: [name], Olivia Mackie Treating Provider/Extender: Skipper Cliche in Treatment: 39 Constitutional Well-nourished and well-hydrated in no acute distress. Respiratory normal breathing without difficulty. Psychiatric this patient is able to make decisions and demonstrates good insight into disease process. Alert and Oriented x 3. pleasant and cooperative. Notes Upon inspection patient's wound on the left  fourth toe did require sharp debridement today I did this and she tolerated that without complication. Post debridement wound bed appears to be doing much better. With regard to the legs no sharp debridement was necessary today. Electronic Signature(s) Signed: 02/11/2021 11:45:01 AM By: Worthy Keeler PA-C Entered By: Worthy Keeler on 02/11/2021 11:45:01 [name], [name] ([id number]) -------------------------------------------------------------------------------- Physician Orders Details Patient Name: [name] Date of Service: 02/11/2021 10:00 AM Medical Record Number: [id number] Patient Account Number: [id number] Date of Birth/Sex: 1950/04/11 (70 y.o. F) Treating RN: Cornell Barman Primary Care Provider: [name], Olivia Mackie Other Clinician: Referring Provider: [name], Olivia Mackie Treating Provider/Extender: Skipper Cliche in Treatment: 85 Verbal / Phone Orders: No Diagnosis Coding Follow-up Appointments o Return Appointment in 2 weeks. o Nurse Visit as needed [facility] [place]: - [facility] for wound care. May utilize formulary equivalent dressing for wound treatment orders unless otherwise specified. Home Health Nurse may visit PRN to address patientos wound care needs. o Scheduled days for dressing changes to be completed; exception, patient has scheduled wound care visit that day. o **Please direct any NON-WOUND related issues/requests for orders to patient's Primary Care Physician. **If current dressing causes regression in wound condition, may D/C ordered dressing product/s and apply Normal Saline Moist Dressing daily until next [place] or Other MD appointment. **Notify [facility] of regression in wound condition at [phone]. Bathing/ Shower/ Hygiene o May shower with wound dressing protected with water repellent cover or cast protector. o No tub  bath. Edema Control - Lymphedema / Segmental Compressive Device / Other o Patient to wear own compression stockings. Remove compression stockings every night before going to bed and put on every morning when getting up. - WHEN NOT USING A COMPRESSION WRAP, USE COMPRESSION HOSE o Elevate, Exercise Daily and Avoid Standing for Long Periods of Time. o Elevate legs to the level of the heart and pump ankles as often as possible o Elevate leg(s) parallel to the floor when sitting. Off-Loading Wound #16 Left,Distal Toe Fourth o Open toe surgical shoe - keep pressure off wound/toes o Other: - suggest float heels when in bed with a pillow to keep pressure off of heels; choose shoewear for right foot to not rub foot Additional Orders / Instructions o Follow Nutritious Diet and Increase Protein Intake Medications-Please add to medication list. o P.O. Antibiotics - Continue antibiotics as prescribed Wound Treatment Wound #13 - Lower Leg Wound Laterality: Right, Midline Cleanser: Soap and Water (Home Health) (Generic) 2 x Per Week/30 Days Discharge Instructions: Gently cleanse wound with antibacterial soap, rinse and pat dry prior to dressing wounds Cleanser: Wound Cleanser (Home Health) (Generic) 2 x Per Week/30 Days Discharge Instructions: Wash your hands with soap and water. Remove old dressing, discard into plastic bag and place into  trash. Cleanse the wound with Wound Cleanser prior to applying a clean dressing using gauze sponges, not tissues or cotton balls. Do not scrub or use excessive force. Pat dry using gauze sponges, not tissue or cotton balls. Primary Dressing: Prisma 4.34 (in) (Home Health) (Generic) 2 x Per Week/30 Days Discharge Instructions: Moisten w/normal saline or sterile water; Cover wound as directed. Do not remove from wound bed. Secondary Dressing: Gauze (Home Health) (Generic) 2 x Per Week/30 Days Discharge Instructions: As directed: dry, moistened with saline  or moistened with Dakins Solution Compression Wrap: Profore Lite LF 3 Multilayer Compression Bandaging System (Home Health) (Generic) 2 x Per Week/30 Days Discharge Instructions: Apply 3 multi-layer wrap as prescribed. [name], [name] ([id number]) Wound #16 - Toe Fourth Wound Laterality: Left, Distal Cleanser: Soap and Water (Home Health) (Generic) 2 x Per Day/30 Days Discharge Instructions: Gently cleanse wound with antibacterial soap, rinse and pat dry prior to dressing wounds Cleanser: Wound Cleanser (Home Health) (Generic) 2 x Per Day/30 Days Discharge Instructions: Wash your hands with soap and water. Remove old dressing, discard into plastic bag and place into trash. Cleanse the wound with Wound Cleanser prior to applying a clean dressing using gauze sponges, not tissues or cotton balls. Do not scrub or use excessive force. Pat dry using gauze sponges, not tissue or cotton balls. Primary Dressing: Prisma 4.34 (in) (Home Health) (Generic) 2 x Per Day/30 Days Discharge Instructions: Moisten w/normal saline or sterile water; Cover wound as directed. Do not remove from wound bed. Add-Ons: Coverlet Latex-Free Fabric Adhesive Dressings (Home Health) (Generic) 2 x Per Day/30 Days Discharge Instructions: Elastic Knuckle Bandage Wound #17 - Lower Leg Wound Laterality: Left Cleanser: Soap and Water (Home Health) (Generic) 2 x Per Week/30 Days Discharge Instructions: Gently cleanse wound with antibacterial soap, rinse and pat dry prior to dressing wounds Cleanser: Wound Cleanser (Home Health) (Generic) 2 x Per Week/30 Days Discharge Instructions: Wash your hands with soap and water. Remove old dressing, discard into plastic bag and place into trash. Cleanse the wound with Wound Cleanser prior to applying a clean dressing using gauze sponges, not tissues or cotton balls. Do not scrub or use excessive force. Pat dry using gauze sponges, not tissue or cotton balls. Primary Dressing: Prisma 4.34 (in)  (Home Health) (Generic) 2 x Per Week/30 Days Discharge Instructions: Moisten w/normal saline or sterile water; Cover wound as directed. Do not remove from wound bed. Secondary Dressing: Zetuvit Plus Silicone Border Dressing 4x4 (in/in) (Home Health) (Generic) 2 x Per Week/30 Days Patient Medications Allergies: Penicillins, Terramycin, aspirin Notifications Medication Indication Start End Keflex 02/11/2021 DOSE 1 - oral 500 mg capsule - 1 capsule oral taken 3 times per day for 30 days Electronic Signature(s) Signed: 02/11/2021 11:48:55 AM By: Worthy Keeler PA-C Entered By: Worthy Keeler on 02/11/2021 11:48:54 [name], [name] ([id number]) -------------------------------------------------------------------------------- Problem List Details Patient Name: [name] Date of Service: 02/11/2021 10:00 AM Medical Record Number: [id number] Patient Account Number: [id number] Date of Birth/Sex: Apr 28, 1950 (70 y.o. F) Treating RN: Cornell Barman Primary Care Provider: [name], Olivia Mackie Other Clinician: Referring Provider: [name], Olivia Mackie Treating Provider/Extender: Skipper Cliche in Treatment: 81 Active Problems ICD-10 Encounter Code Description Active Date MDM Diagnosis E11.621 Type 2 diabetes mellitus with foot ulcer 06/28/2020 No Yes M86.572 Other chronic hematogenous osteomyelitis, left ankle and foot 02/11/2021 No Yes L97.522 Non-pressure chronic ulcer of other part of left foot with fat layer 06/28/2020 No Yes exposed I87.2 Venous insufficiency (chronic) (peripheral) 06/28/2020 No Yes L97.812 Non-pressure  chronic ulcer of other part of right lower leg with fat layer 06/28/2020 No Yes exposed L97.822 Non-pressure chronic ulcer of other part of left lower leg with fat layer 02/11/2021 No Yes exposed Z79.01 Long term (current) use of anticoagulants 06/28/2020 No Yes Inactive Problems ICD-10 Code Description Active Date Inactive Date I10 Essential (primary) hypertension  06/28/2020 06/28/2020 Z86.718 Personal history of other venous thrombosis and embolism 06/28/2020 06/28/2020 Resolved Problems ICD-10 Code Description Active Date Resolved Date L97.322 Non-pressure chronic ulcer of left ankle with fat layer exposed 06/28/2020 06/28/2020 S50.811A Abrasion of right forearm, initial encounter 10/30/2020 10/30/2020 ANNETE, [name] ([id number]) S50.311D Abrasion of right elbow, subsequent encounter 08/16/2020 08/16/2020 Electronic Signature(s) Signed: 02/11/2021 11:35:21 AM By: Worthy Keeler PA-C Entered By: Worthy Keeler on 02/11/2021 11:35:20 Ulatowski, [name] ([id number]) -------------------------------------------------------------------------------- Progress Note Details Patient Name: [name] Date of Service: 02/11/2021 10:00 AM Medical Record Number: [id number] Patient Account Number: [id number] Date of Birth/Sex: Sep 10, 1950 (70 y.o. F) Treating RN: Cornell Barman Primary Care Provider: [name], Olivia Mackie Other Clinician: Referring Provider: [name], Olivia Mackie Treating Provider/Extender: Skipper Cliche in Treatment: 18 Subjective Chief Complaint Information obtained from Patient Right Leg Ulcer and Right 4thToe Ulcer History of Present Illness (HPI) 06/28/20 upon evaluation today patient appears to be doing unfortunately somewhat poorly in regard to wounds that she has over her lower extremities. She has a wound on the left distal second toe, left posterior heel, and right anterior lower leg. With that being said all in all none of the wounds appear to be too bad the one that hurts her the most is actually the wound on the posterior heel which actually is also one of the smallest. Nonetheless I think there is some callus hiding what is really going on in this area. Fortunately there does not appear to be any obvious evidence of infection which is great news. Patient does have a history of diabetes mellitus type 2, chronic venous insufficiency,  hypertension, history of DVTs, and is on long-term anticoagulant therapy, Eliquis. 07/05/20 upon evaluation today patient appears to be doing well currently with regard to her wounds. I feel like she is making progress which is great news and overall there is no signs of active infection at this time. No fevers, chills, nausea, vomiting, or diarrhea. 4/29; patient has a only a small area remaining on the tip of her left second toe and a small area remaining on the left heel. The area on the right anterior lower leg is healed. They tell me that she had remote podiatric surgery on the toes of the left foot however they are very fixed in position and I wonder whether they are making it difficult to relieve pressure in her foot wear. She is a type II diabetic with neuropathy as well. 08/01/2020 upon evaluation today patient appears to be doing well currently in regard to her wounds. In fact everything appears to be doing much better. The posterior aspect of her heel is actually giving her some trouble not the Achilles area but a small wound that we did not even have marked. In fact I had noted this before. Nonetheless we are going to addressed that today. 5/27; most of the patient's wounds are healed including the left Achilles heel left toe. Right forearm is also healed. She has a new abrasion posteriorly in the right elbow area just above the olecranon this happened today with an abrasion from her car door 6/15; the patient's wounds on the left Achilles and left  second toe remain healed. The area on the right forearm is also just about healed in fact the skin I replaced last time is looking viable which is good. She had me look at the left heel again which is not in the area of the wound more towards the tip of the heel at the Achilles insertion site as well as the tip of the left foot second toe. In the Achilles area that is clearly is friction probably in bed at night. Not really near where the wound  was. The left second toe remains healed although this is hammered and overhanging first toe also puts pressure in this area 09/13/2020 upon evaluation today patient appears to be doing well with regard to all previous wounds that she had. Everything is completely healed. With that being said I do think that the patient unfortunately has a new skin tear on the right forearm which is due to her put a Band-Aid on it and then when it pulled off this caused some issues with skin tearing. Nonetheless I think that this needs to be addressed today. Hopefully will take too long to get this to heal. She is having some issues with pain in her heel but I think this is more related to be honest to her neuropathy as opposed to anything else. Readmission: 10/30/2020 upon evaluation today patient appears to be doing poorly in regard to her legs bilaterally. She is also having an issue with her fourth toe on the left. Fortunately there does not appear to be any signs of active infection at this time. No fevers, chills, nausea, vomiting, or diarrhea. It is actually been quite a while since we have seen her. In fact June 24 was the last time that she was here in the clinic. She tells me she did not come back because things were just doing "pretty well". 11/29/2020 upon evaluation today patient actually appears to be doing well with regard to her arms those are completely healed. Her right leg is still open but doing okay. The fourth toe of the left foot does show some signs here of having some issues with necrotic tissue of an open wound. This is where a toenail was removed by podiatry. Subsequently I do think that we need to go ahead and see what we can do about getting this cleared away so being try to get some healing going forward. 12/13/2020 upon evaluation today patient appears to be doing decently well in regard to her wound on the right leg. In general I think that she is making good progress here. Unfortunately  she continues to have significant issues with swelling in the left leg is now even more swollen at this point. For that reason I do believe she would be benefited by wrapping both sides although she is not interested in doing this. Overall I think that she is definitely making some good progress here nonetheless in regard to the right leg left leg leave some to be desired. 12/27/2020 upon evaluation today patient appears to be doing well with regard to her wound in regard to the legs. Unfortunately the toe was not doing nearly as well. I am much more concerned about infection and osteomyelitis here. She had the MRI but right now were not seeing obvious evidence of redness spreading up to her foot this is good news I do not have the result of the MRI as of yet. I am good to place her on doxycycline however due to the fact that  the wound does seem to be getting a little worse in regard to her toe. 01/14/2021 upon evaluation today patient appears to be doing well with regard to her ulcers. The right leg is doing well and even the toe seems to be doing well though she still has some evidence of bone exposed on the toe which does not appear to be doing quite as good as I would like to see just and the fact that is still not covered over new tissue but looks tremendously better compared to 2 weeks ago. Overall very pleased in that regard. 01/28/2021 upon evaluation today patient's leg on the left actually appears to have new wounds this is due to her deciding to shave yesterday. Fortunately there does not appear to be any signs of infection currently. This is good news. Nonetheless I do believe that with these new areas open it would be best to wrap her leg to try to make sure we get this under control sooner rather than later. With regard to her toe there still does [name], [name]. ([id number]) appear to be some evidence of bone exposed this was covered over with callus I did remove that today. Nonetheless  I think this is significantly smaller than previous. 02/11/2021 upon evaluation today patient appears to be doing okay in regard to her leg ulcers. I do not see anything obvious at this point this seems to be a complicating factor. I think that she is getting better. Were wrap of the right leg not the left leg. With that being said the left fourth toe ulcer still does have evidence of being open there is some drainage still on top of the wound I think we can to try to see about using something like a collagen dressing today to see if that would be of benefit for her. She is in agreement with giving this a trial. Nonetheless I am concerned about the fact that she continues to have issues here with this not healing it is confirmed chronic osteomyelitis she has been on antibiotics. I am going to extend those today as well. That is Keflex and that will be for 1 additional month. Nonetheless if she is not doing significantly better by the next time I see her I think that the biggest thing is going to be for [place] to go ahead and see what we can do about getting her started with hyperbaric oxygen therapy to try to save the toe. Objective Constitutional Well-nourished and well-hydrated in no acute distress. Vitals Time Taken: 10:04 AM, Height: 65.5 in, Weight: 240 lbs, BMI: 39.3, Temperature: 98.7 F, Pulse: 71 bpm, Respiratory Rate: 20 breaths/min, Blood Pressure: 162/92 mmHg. Respiratory normal breathing without difficulty. Psychiatric this patient is able to make decisions and demonstrates good insight into disease process. Alert and Oriented x 3. pleasant and cooperative. General Notes: Upon inspection patient's wound on the left fourth toe did require sharp debridement today I did this and she tolerated that without complication. Post debridement wound bed appears to be doing much better. With regard to the legs no sharp debridement was necessary today. Integumentary (Hair, Skin) Wound #13 status  is Open. Original cause of wound was Gradually Appeared. The date acquired was: 10/21/2020. The wound has been in treatment 14 weeks. The wound is located on the Right,Midline Lower Leg. The wound measures 0.5cm length x 0.5cm width x 0.1cm depth; 0.196cm^2 area and 0.02cm^3 volume. There is a medium amount of serosanguineous drainage noted. Wound #16 status is Open. Original  cause of wound was Pressure Injury. The date acquired was: 10/31/2020. The wound has been in treatment 10 weeks. The wound is located on the Left,Distal Toe Fourth. The wound measures 0.5cm length x 0.6cm width x 0.2cm depth; 0.236cm^2 area and 0.047cm^3 volume. There is bone and Fat Layer (Subcutaneous Tissue) exposed. There is a medium amount of sanguinous drainage noted. The wound margin is flat and intact. There is no granulation within the wound bed. There is a large (67-100%) amount of necrotic tissue within the wound bed including Eschar. Wound #17 status is Open. Original cause of wound was Blister. The date acquired was: 12/13/2020. The wound has been in treatment 8 weeks. The wound is located on the Left Lower Leg. The wound measures 0.5cm length x 0.5cm width x 0.1cm depth; 0.196cm^2 area and 0.02cm^3 volume. There is a medium amount of serosanguineous drainage noted. 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 ([id number]) Procedures Wound #16 Pre-procedure diagnosis of Wound #16 is a Diabetic Wound/Ulcer of the Lower Extremity located on the Left,Distal Toe Fourth .Severity of  Tissue Pre Debridement is: Fat layer exposed. There was a Excisional Skin/Subcutaneous Tissue Debridement with a total area of 0.3 sq cm performed by Tommie Sams., PA-C. With the following instrument(s): Curette to remove Viable and Non-Viable tissue/material. Material removed includes Eschar, Subcutaneous Tissue, and Slough. No specimens were taken. A time out was conducted at 10:36, prior to the start of the procedure. A Minimum amount of bleeding was controlled with Pressure. The procedure was tolerated well. Post Debridement Measurements: 0.5cm length x 0.6cm width x 0.2cm depth; 0.047cm^3 volume. Character of Wound/Ulcer Post Debridement is stable. Severity of Tissue Post Debridement is: Fat layer exposed. Post procedure Diagnosis Wound #16: Same as Pre-Procedure Plan Follow-up Appointments: Return Appointment in 2 weeks. Nurse Visit as needed Home Health: Wound #13 Right,Midline Lower Leg: [place]: - [place] for wound care. May utilize formulary equivalent dressing for wound treatment orders unless otherwise specified. Home Health Nurse may visit PRN to address patient s wound care needs. Scheduled days for dressing changes to be completed; exception, patient has scheduled wound care visit that day. **Please direct any NON-WOUND related issues/requests for orders to patient's Primary Care Physician. **If current dressing causes regression in wound condition, may D/C ordered dressing product/s and apply Normal Saline Moist Dressing daily until next [place] or Other MD appointment. **Notify [facility] of regression in wound condition at [phone]. Bathing/ Shower/ Hygiene: May shower with wound dressing protected with water repellent cover or cast protector. No tub bath. Edema Control - Lymphedema / Segmental Compressive Device / Other: Patient to wear own compression stockings. Remove compression stockings every night  before going to bed and put on every morning when getting up. - WHEN NOT USING A COMPRESSION WRAP, USE COMPRESSION HOSE Elevate, Exercise Daily and Avoid Standing for Long Periods of Time. Elevate legs to the level of the heart and pump ankles as often as possible Elevate leg(s) parallel to the floor when sitting. Off-Loading: Wound #16 Left,Distal Toe Fourth: Open toe surgical shoe - keep  pressure off wound/toes Other: - suggest float heels when in bed with a pillow to keep pressure off of heels; choose shoewear for right foot to not rub foot Additional Orders / Instructions: Follow Nutritious Diet and Increase Protein Intake Medications-Please add to medication list.: P.O. Antibiotics - Continue antibiotics as prescribed The following medication(s) was prescribed: Keflex oral 500 mg capsule 1 1 capsule oral taken 3 times per day for 30 days starting 02/11/2021 WOUND #13: - Lower Leg Wound Laterality: Right, Midline Cleanser: Soap and Water (Home Health) (Generic) 2 x Per Week/30 Days Discharge Instructions: Gently cleanse wound with antibacterial soap, rinse and pat dry prior to dressing wounds Cleanser: Wound Cleanser ([place]) (Generic) 2 x Per Week/30 Days Discharge Instructions: Wash your hands with soap and water. Remove old dressing, discard into plastic bag and place into trash. Cleanse the wound with Wound Cleanser prior to applying a clean dressing using gauze sponges, not tissues or cotton balls. Do not scrub or use excessive force. Pat dry using gauze sponges, not tissue or cotton balls. Primary Dressing: Prisma 4.34 (in) (Home Health) (Generic) 2 x Per Week/30 Days Discharge Instructions: Moisten w/normal saline or sterile water; Cover wound as directed. Do not remove from wound bed. Secondary Dressing: Gauze (Home Health) (Generic) 2 x Per Week/30 Days Discharge Instructions: As directed: dry, moistened with saline or moistened with Dakins Solution Compression Wrap:  Profore Lite LF 3 Multilayer Compression Bandaging System (Home Health) (Generic) 2 x Per Week/30 Days Discharge Instructions: Apply 3 multi-layer wrap as prescribed. WOUND #16: - Toe Fourth Wound Laterality: Left, Distal Cleanser: Soap and Water (Home Health) (Generic) 2 x Per Day/30 Days Discharge Instructions: Gently cleanse wound with antibacterial soap, rinse and pat dry prior to dressing wounds Cleanser: Wound Cleanser (Home Health) (Generic) 2 x Per Day/30 Days Discharge Instructions: Wash your hands with soap and water. Remove old dressing, discard into plastic bag and place into trash. Cleanse the wound with Wound Cleanser prior to applying a clean dressing using gauze sponges, not tissues or cotton balls. Do not scrub or use excessive force. Pat dry using gauze sponges, not tissue or cotton balls. Primary Dressing: Prisma 4.34 (in) (Home Health) (Generic) 2 x Per Day/30 Days Discharge Instructions: Moisten w/normal saline or sterile water; Cover wound as directed. Do not remove from wound bed. Add-Ons: Coverlet Latex-Free Fabric Adhesive Dressings (Home Health) (Generic) 2 x Per Day/30 Days Discharge Instructions: Elastic Knuckle Bandage WOUND #17: - Lower Leg Wound Laterality: Left Cleanser: Soap and Water (Home Health) (Generic) 2 x Per Week/30 Days Discharge Instructions: Gently cleanse wound with antibacterial soap, rinse and pat dry prior to dressing wounds [name], [name]. ([id number]) Cleanser: Wound Cleanser (Home Health) (Generic) 2 x Per Week/30 Days Discharge Instructions: Wash your hands with soap and water. Remove old dressing, discard into plastic bag and place into trash. Cleanse the wound with Wound Cleanser prior to applying a clean dressing using gauze sponges, not tissues or cotton balls. Do not scrub or use excessive force. Pat dry using gauze sponges, not tissue or cotton balls. Primary Dressing: Prisma 4.34 (in) (Home Health) (Generic) 2 x Per Week/30  Days Discharge Instructions: Moisten w/normal saline or sterile water; Cover wound as directed. Do not remove from wound bed. Secondary Dressing: Zetuvit Plus Silicone Border Dressing 4x4 (in/in) (Home Health) (Generic) 2 x Per Week/30 Days 1. Based on what I am seeing currently my suggestion is going to be that we go ahead and see about getting the patient started  with silver collagen to all wounds to see if this will help get things closed. 2. Also can recommend that we have the patient continue with the Zetuvit silicone border foam dressing on the left leg on the right leg were wrapping. 3. I am also can recommend that we have the patient continue to use now collagen on the toe as well using a coverlet to secure in place. 4. If we do not see signs of the toe doing significantly better by next week my suggestion is good to be for [place] to go ahead and see about getting the patient set up for hyperbaric oxygen therapy. 5. I am also can recommend that we see about having the patient continue with the Keflex I am going to send that into the pharmacy today as well. We will see patient back for reevaluation in 1 week here in the clinic. If anything worsens or changes patient will contact our office for additional recommendations. Electronic Signature(s) Signed: 02/11/2021 11:50:35 AM By: Worthy Keeler PA-C Entered By: Worthy Keeler on 02/11/2021 11:50:35 Wilcoxson, [name] ([id number]) -------------------------------------------------------------------------------- SuperBill Details Patient Name: [name] Date of Service: 02/11/2021 Medical Record Number: [id number] Patient Account Number: [id number] Date of Birth/Sex: Sep 22, 1950 (70 y.o. F) Treating RN: Cornell Barman Primary Care Provider: [name], Olivia Mackie Other Clinician: Referring Provider: [name], Olivia Mackie Treating Provider/Extender: Skipper Cliche in Treatment: 32 Diagnosis Coding ICD-10 Codes Code  Description E11.621 Type 2 diabetes mellitus with foot ulcer M86.572 Other chronic hematogenous osteomyelitis, left ankle and foot L97.522 Non-pressure chronic ulcer of other part of left foot with fat layer exposed I87.2 Venous insufficiency (chronic) (peripheral) L97.812 Non-pressure chronic ulcer of other part of right lower leg with fat layer exposed L97.822 Non-pressure chronic ulcer of other part of left lower leg with fat layer exposed Z79.01 Long term (current) use of anticoagulants Facility Procedures The patient participates with Medicare or their insurance follows the Medicare Facility Guidelines: CPT4 Code Description Modifier Quantity [id number] 11042 - DEB SUBQ TISSUE 20 SQ CM/< 1 The patient participates with Medicare or their insurance follows the Medicare Facility Guidelines: ICD-10 Diagnosis Description L97.522 Non-pressure chronic ulcer of other part of left foot with fat layer exposed Physician Procedures CPT4 Code: 2449753 Description: 99214 - WC PHYS LEVEL 4 - EST PT Modifier: 25 Quantity: 1 CPT4 Code: Description: ICD-10 Diagnosis Description E11.621 Type 2 diabetes mellitus with foot ulcer M86.572 Other chronic hematogenous osteomyelitis, left ankle and foot L97.522 Non-pressure chronic ulcer of other part of left foot with fat layer exp I87.2 Venous  insufficiency (chronic) (peripheral) Modifier: osed Quantity: CPT4 Code: [id number] Description: 11173 - WC PHYS SUBQ TISS 20 SQ CM Modifier: Quantity: 1 CPT4 Code: Description: ICD-10 Diagnosis Description L97.522 Non-pressure chronic ulcer of other part of left foot with fat layer exp Modifier: osed Quantity: Electronic Signature(s) Signed: 02/11/2021 11:51:13 AM By: Worthy Keeler PA-C Entered By: Worthy Keeler on 02/11/2021 11:51:12

## 2021-02-11 NOTE — Progress Notes (Signed)
MARKASIA, CARROL ([id number]) Visit Report for 02/11/2021 Arrival Information Details Patient Name: [name], [name] Date of Service: 02/11/2021 10:00 AM Medical Record Number: [id number] Patient Account Number: [id number] Date of Birth/Sex: Mar 02, 1951 (70 y.o. F) Treating RN: Cornell Barman Primary Care Sonoma Firkus: [name], Olivia Mackie Other Clinician: Referring Cesare Sumlin: [name], Olivia Mackie Treating Arzella Rehmann/Extender: Skipper Cliche in Treatment: 55 Visit Information History Since Last Visit Has Dressing in Place as Prescribed: Yes Patient Arrived: Wheel Chair Pain Present Now: No Arrival Time: 10:00 Accompanied By: husband Transfer Assistance: Manual Patient Identification Verified: Yes Secondary Verification Process Completed: Yes Patient Has Alerts: Yes Patient Alerts: Patient on Blood Thinner Eliquis Diabetic II 06/2020 ABI L)1.22 R)1.14 Electronic Signature(s) Signed: 02/11/2021 4:45:38 PM By: Gretta Cool, BSN, RN, CWS, Kim RN, BSN Entered By: Gretta Cool, BSN, RN, CWS, Kim on 02/11/2021 10:04:50 [name] ([id number]) -------------------------------------------------------------------------------- Encounter Discharge Information Details Patient Name: [name] Date of Service: 02/11/2021 10:00 AM Medical Record Number: [id number] Patient Account Number: [id number] Date of Birth/Sex: 1950/09/22 (70 y.o. F) Treating RN: Cornell Barman Primary Care Aylanie Cubillos: [name], Olivia Mackie Other Clinician: Referring Ronnald Shedden: [name], Olivia Mackie Treating Terez Montee/Extender: Skipper Cliche in Treatment: 15 Encounter Discharge Information Items Post Procedure Vitals Discharge Condition: Stable Temperature (F): 98.7 Ambulatory Status: Wheelchair Pulse (bpm): 71 Discharge Destination: Home Respiratory Rate (breaths/min): 18 Transportation: Private Auto Blood Pressure (mmHg): 162/92 Schedule Follow-up Appointment: No Clinical Summary of Care: Electronic  Signature(s) Signed: 02/11/2021 4:45:38 PM By: Gretta Cool, BSN, RN, CWS, Kim RN, BSN Entered By: Gretta Cool, BSN, RN, CWS, Kim on 02/11/2021 11:07:44 [name] ([id number]) -------------------------------------------------------------------------------- Lower Extremity Assessment Details Patient Name: [name] Date of Service: 02/11/2021 10:00 AM Medical Record Number: [id number] Patient Account Number: [id number] Date of Birth/Sex: 03/01/1951 (70 y.o. F) Treating RN: Cornell Barman Primary Care Stan Cantave: [name], Olivia Mackie Other Clinician: Referring Takeya Marquis: [name], Olivia Mackie Treating Vi Whitesel/Extender: Skipper Cliche in Treatment: 32 Edema Assessment Assessed: [Left: No] [Right: No] [Left: Edema] [Right: :] Calf Left: Right: Point of Measurement: 31 cm From Medial Instep 34 cm 32.8 cm Ankle Left: Right: Point of Measurement: 11 cm From Medial Instep 17.5 cm 18 cm Vascular Assessment Pulses: Dorsalis Pedis Palpable: [Left:Yes] [Right:Yes] Electronic Signature(s) Signed: 02/11/2021 4:45:38 PM By: Gretta Cool, BSN, RN, CWS, Kim RN, BSN Entered By: Gretta Cool, BSN, RN, CWS, Kim on 02/11/2021 10:20:19 [name], [name] ([id number]) -------------------------------------------------------------------------------- Multi Wound Chart Details Patient Name: [name] Date of Service: 02/11/2021 10:00 AM Medical Record Number: [id number] Patient Account Number: [id number] Date of Birth/Sex: 02-Sep-1950 (70 y.o. F) Treating RN: Cornell Barman Primary Care Ricardo Schubach: [name], Olivia Mackie Other Clinician: Referring Perpetua Elling: [name], Olivia Mackie Treating Icholas Irby/Extender: Skipper Cliche in Treatment: 32 Vital Signs Height(in): 65.5 Pulse(bpm): 69 Weight(lbs): 240 Blood Pressure(mmHg): 162/92 Body Mass Index(BMI): 39 Temperature(F): 98.7 Respiratory Rate(breaths/min): 20 Photos: [13:No Photos] [17:No Photos] Wound Location: Right, Midline Lower Leg Left, Distal Toe  Fourth Left Lower Leg Wounding Event: Gradually Appeared Pressure Injury Blister Primary Etiology: Venous Leg Ulcer Diabetic Wound/Ulcer of the Lower Venous Leg Ulcer Extremity Comorbid History: N/A Asthma, Pneumothorax, N/A Arrhythmia, Hypertension, Type II Diabetes, Gout, Osteoarthritis, Neuropathy Date Acquired: 10/21/2020 10/31/2020 12/13/2020 Weeks of Treatment: '14 10 8 ' Wound Status: Open Open Open Measurements L x W x D (cm) 0.5x0.5x0.1 0.5x0.6x0.2 0.5x0.5x0.1 Area (cm) : 0.196 0.236 0.196 Volume (cm) : 0.02 0.047 0.02 % Reduction in Area: 95.80% 69.90% 28.70% % Reduction in Volume: 95.70% 40.50% 25.90% Classification: Full Thickness Without Exposed Grade 3 Full Thickness Without Exposed Support Structures Support Structures Exudate Amount: Medium Medium Medium Exudate  Type: Serosanguineous Sanguinous Serosanguineous Exudate Color: red, brown red red, brown Wound Margin: N/A Flat and Intact N/A Granulation Amount: N/A None Present (0%) N/A Necrotic Amount: N/A Large (67-100%) N/A Necrotic Tissue: N/A Eschar N/A Treatment Notes Electronic Signature(s) [name], [name] ([id number]) Signed: 02/11/2021 4:45:38 PM By: Gretta Cool, BSN, RN, CWS, Kim RN, BSN Entered By: Gretta Cool, BSN, RN, CWS, Kim on 02/11/2021 10:35:55 [name], [name] ([id number]) -------------------------------------------------------------------------------- [place] Details Patient Name: [name] Date of Service: 02/11/2021 10:00 AM Medical Record Number: [id number] Patient Account Number: [id number] Date of Birth/Sex: [birth year] (70 y.o. F) Treating RN: Cornell Barman Primary Care Kenyata Guess: [name], Olivia Mackie Other Clinician: Referring Octivia Canion: [name], Olivia Mackie Treating Kieley Akter/Extender: Skipper Cliche in Treatment: 32 Active Inactive Wound/Skin Impairment Nursing Diagnoses: Knowledge deficit related to ulceration/compromised skin integrity Goals: Patient/caregiver will  verbalize understanding of skin care regimen Date Initiated: 06/28/2020 Date Inactivated: 08/16/2020 Target Resolution Date: 07/28/2020 Goal Status: Met Ulcer/skin breakdown will have a volume reduction of 30% by week 4 Date Initiated: 06/28/2020 Date Inactivated: 08/16/2020 Target Resolution Date: 07/28/2020 Goal Status: Met Ulcer/skin breakdown will have a volume reduction of 50% by week 8 Date Initiated: 06/28/2020 Date Inactivated: 09/13/2020 Target Resolution Date: 08/28/2020 Goal Status: Met Ulcer/skin breakdown will have a volume reduction of 80% by week 12 Date Initiated: 06/28/2020 Date Inactivated: 01/14/2021 Target Resolution Date: 09/27/2020 Unmet Reason: missed appt/non Goal Status: Unmet compliant Ulcer/skin breakdown will heal within 14 weeks Date Initiated: 06/28/2020 Target Resolution Date: 10/28/2020 Goal Status: Active Interventions: Assess patient/caregiver ability to obtain necessary supplies Assess patient/caregiver ability to perform ulcer/skin care regimen upon admission and as needed Assess ulceration(s) every visit Notes: Electronic Signature(s) Signed: 02/11/2021 4:45:38 PM By: Gretta Cool, BSN, RN, CWS, Kim RN, BSN Entered By: Gretta Cool, BSN, RN, CWS, Kim on 02/11/2021 10:35:45 [name], [name] ([id number]) -------------------------------------------------------------------------------- Pain Assessment Details Patient Name: [name] Date of Service: 02/11/2021 10:00 AM Medical Record Number: [id number] Patient Account Number: [id number] Date of Birth/Sex: 03-07-1951 (70 y.o. F) Treating RN: Cornell Barman Primary Care Dian Laprade: [name], Olivia Mackie Other Clinician: Referring Lyndsi Altic: [name], Olivia Mackie Treating Quintrell Baze/Extender: Skipper Cliche in Treatment: 32 Active Problems Location of Pain Severity and Description of Pain Patient Has Paino No Site Locations Pain Management and Medication Current Pain Management: Notes stated L shoulder pain Electronic  Signature(s) Signed: 02/11/2021 4:45:38 PM By: Gretta Cool, BSN, RN, CWS, Kim RN, BSN Entered By: Gretta Cool, BSN, RN, CWS, Kim on 02/11/2021 10:05:32 [name] ([id number]) -------------------------------------------------------------------------------- Patient/Caregiver Education Details Patient Name: [name] Date of Service: 02/11/2021 10:00 AM Medical Record Number: [id number] Patient Account Number: [id number] Date of Birth/Gender: 1950/09/18 (70 y.o. F) Treating RN: Cornell Barman Primary Care Physician: [name], Olivia Mackie Other Clinician: Referring Physician: [name], Olivia Mackie Treating Physician/Extender: Skipper Cliche in Treatment: 58 Education Assessment Education Provided To: Patient Education Topics Provided Venous: Controlling Swelling with Compression Stockings , Controlling Swelling with Multilayered Compression Wraps, Other: Compression Handouts: sock on left, 3 layer compression wrap on right Methods: Demonstration, Explain/Verbal Responses: State content correctly Electronic Signature(s) Signed: 02/11/2021 4:45:38 PM By: Gretta Cool, BSN, RN, CWS, Kim RN, BSN Entered By: Gretta Cool, BSN, RN, CWS, Kim on 02/11/2021 11:05:42 ANNA-MARIE, COLLER ([id number]) -------------------------------------------------------------------------------- Wound Assessment Details Patient Name: [name] Date of Service: 02/11/2021 10:00 AM Medical Record Number: [id number] Patient Account Number: [id number] Date of Birth/Sex: May 02, 1950 (70 y.o. F) Treating RN: Cornell Barman Primary Care Levent Kornegay: [name], Olivia Mackie Other Clinician: Referring Crystian Frith: [name], Olivia Mackie Treating Leola Fiore/Extender: Skipper Cliche in Treatment: 32 Wound Status Wound Number:  13 Primary Etiology: Venous Leg Ulcer Wound Location: Right, Midline Lower Leg Wound Status: Open Wounding Event: Gradually Appeared Date Acquired: 10/21/2020 Weeks Of Treatment: 14 Clustered Wound:  No Photos Photo Uploaded By: Gretta Cool, BSN, RN, CWS, Kim on 02/11/2021 16:06:18 Wound Measurements Length: (cm) 0.5 Width: (cm) 0.5 Depth: (cm) 0.1 Area: (cm) 0.196 Volume: (cm) 0.02 % Reduction in Area: 95.8% % Reduction in Volume: 95.7% Wound Description Classification: Full Thickness Without Exposed Support Structu Exudate Amount: Medium Exudate Type: Serosanguineous Exudate Color: red, brown res Treatment Notes Wound #13 (Lower Leg) Wound Laterality: Right, Midline Cleanser Soap and Water Discharge Instruction: Gently cleanse wound with antibacterial soap, rinse and pat dry prior to dressing wounds Wound Cleanser Discharge Instruction: Wash your hands with soap and water. Remove old dressing, discard into plastic bag and place into trash. Cleanse the wound with Wound Cleanser prior to applying a clean dressing using gauze sponges, not tissues or cotton balls. Do not scrub or use excessive force. Pat dry using gauze sponges, not tissue or cotton balls. Peri-Wound Care Topical Primary Dressing Prisma 4.34 (in) Platas, Ayana B. ([id number]) Discharge Instruction: Moisten w/normal saline or sterile water; Cover wound as directed. Do not remove from wound bed. Secondary Dressing Gauze Discharge Instruction: As directed: dry, moistened with saline or moistened with Dakins Solution Secured With Compression Wrap Profore Lite LF 3 Multilayer Compression Bandaging System Discharge Instruction: Apply 3 multi-layer wrap as prescribed. Compression Stockings Environmental education officer) Signed: 02/11/2021 4:45:38 PM By: Gretta Cool, BSN, RN, CWS, Kim RN, BSN Entered By: Gretta Cool, BSN, RN, CWS, Kim on 02/11/2021 10:14:30 [name], [name] ([id number]) -------------------------------------------------------------------------------- Wound Assessment Details Patient Name: [name] Date of Service: 02/11/2021 10:00 AM Medical Record Number: [id number] Patient Account Number:  [id number] Date of Birth/Sex: 1950-11-26 (70 y.o. F) Treating RN: Cornell Barman Primary Care Clydia Nieves: [name], Olivia Mackie Other Clinician: Referring Quinlan Mcfall: [name], Olivia Mackie Treating Shuayb Schepers/Extender: Skipper Cliche in Treatment: 32 Wound Status Wound Number: 16 Primary Diabetic Wound/Ulcer of the Lower Extremity Etiology: Wound Location: Left, Distal Toe Fourth Wound Open Wounding Event: Pressure Injury Status: Date Acquired: 10/31/2020 Notes: stated toenail was removed by podiatry doctor on Weeks Of Treatment: 10 11/08/20 Clustered Wound: No Comorbid Asthma, Pneumothorax, Arrhythmia, Hypertension, Type History: II Diabetes, Gout, Osteoarthritis, Neuropathy Photos Wound Measurements Length: (cm) 0.5 Width: (cm) 0.6 Depth: (cm) 0.2 Area: (cm) 0.236 Volume: (cm) 0.047 % Reduction in Area: 69.9% % Reduction in Volume: 40.5% Wound Description Classification: Grade 3 Wound Margin: Flat and Intact Exudate Amount: Medium Exudate Type: Sanguinous Exudate Color: red Foul Odor After Cleansing: No Slough/Fibrino No Wound Bed Granulation Amount: None Present (0%) Exposed Structure Necrotic Amount: Large (67-100%) Fascia Exposed: No Necrotic Quality: Eschar Fat Layer (Subcutaneous Tissue) Exposed: Yes Tendon Exposed: No Muscle Exposed: No Joint Exposed: No Bone Exposed: Yes Treatment Notes Wound #16 (Toe Fourth) Wound Laterality: Left, Distal Cleanser Soap and Water Discharge Instruction: Gently cleanse wound with antibacterial soap, rinse and pat dry prior to dressing wounds [name], Dawn B. ([id number]) Wound Cleanser Discharge Instruction: Wash your hands with soap and water. Remove old dressing, discard into plastic bag and place into trash. Cleanse the wound with Wound Cleanser prior to applying a clean dressing using gauze sponges, not tissues or cotton balls. Do not scrub or use excessive force. Pat dry using gauze sponges, not tissue or cotton  balls. Peri-Wound Care Topical Primary Dressing Prisma 4.34 (in) Discharge Instruction: Moisten w/normal saline or sterile water; Cover wound as directed. Do not remove from wound bed. Secondary Dressing  Secured With Compression Wrap Compression Stockings Add-Ons Coverlet Latex-Free Fabric Adhesive Dressings Discharge Instruction: Statistician) Signed: 02/11/2021 4:45:38 PM By: Gretta Cool, BSN, RN, CWS, Kim RN, BSN Entered By: Gretta Cool, BSN, RN, CWS, Kim on 02/11/2021 10:35:14 [name], [name] ([id number]) -------------------------------------------------------------------------------- Wound Assessment Details Patient Name: [name] Date of Service: 02/11/2021 10:00 AM Medical Record Number: [id number] Patient Account Number: [id number] Date of Birth/Sex: 19-Jan-1951 (70 y.o. F) Treating RN: Cornell Barman Primary Care Asa Fath: [name], Olivia Mackie Other Clinician: Referring Finlee Milo: [name], Olivia Mackie Treating Nezar Buckles/Extender: Skipper Cliche in Treatment: 32 Wound Status Wound Number: 17 Primary Etiology: Venous Leg Ulcer Wound Location: Left Lower Leg Wound Status: Open Wounding Event: Blister Date Acquired: 12/13/2020 Weeks Of Treatment: 8 Clustered Wound: No Photos Photo Uploaded By: Gretta Cool, BSN, RN, CWS, Kim on 02/11/2021 16:06:18 Wound Measurements Length: (cm) 0.5 Width: (cm) 0.5 Depth: (cm) 0.1 Area: (cm) 0.196 Volume: (cm) 0.02 % Reduction in Area: 28.7% % Reduction in Volume: 25.9% Wound Description Classification: Full Thickness Without Exposed Support Structu Exudate Amount: Medium Exudate Type: Serosanguineous Exudate Color: red, brown res Treatment Notes Wound #17 (Lower Leg) Wound Laterality: Left Cleanser Soap and Water Discharge Instruction: Gently cleanse wound with antibacterial soap, rinse and pat dry prior to dressing wounds Wound Cleanser Discharge Instruction: Wash your hands with soap and  water. Remove old dressing, discard into plastic bag and place into trash. Cleanse the wound with Wound Cleanser prior to applying a clean dressing using gauze sponges, not tissues or cotton balls. Do not scrub or use excessive force. Pat dry using gauze sponges, not tissue or cotton balls. Peri-Wound Care Topical Primary Dressing Prisma 4.34 (in) Jentz, Dalexa B. ([id number]) Discharge Instruction: Moisten w/normal saline or sterile water; Cover wound as directed. Do not remove from wound bed. Secondary Dressing Zetuvit Plus Silicone Border Dressing 4x4 (in/in) Secured With Compression Wrap Compression Stockings Environmental education officer) Signed: 02/11/2021 4:45:38 PM By: Gretta Cool, BSN, RN, CWS, Kim RN, BSN Entered By: Gretta Cool, BSN, RN, CWS, Kim on 02/11/2021 10:16:10 [name], [name] ([id number]) -------------------------------------------------------------------------------- Vitals Details Patient Name: [name] Date of Service: 02/11/2021 10:00 AM Medical Record Number: [id number] Patient Account Number: [id number] Date of Birth/Sex: 1951/01/12 (70 y.o. F) Treating RN: Cornell Barman Primary Care Mujahid Jalomo: [name], Olivia Mackie Other Clinician: Referring Jeanet Lupe: [name], Olivia Mackie Treating Shady Bradish/Extender: Skipper Cliche in Treatment: 32 Vital Signs Time Taken: 10:04 Temperature (F): 98.7 Height (in): 65.5 Pulse (bpm): 71 Weight (lbs): 240 Respiratory Rate (breaths/min): 20 Body Mass Index (BMI): 39.3 Blood Pressure (mmHg): 162/92 Reference Range: 80 - 120 mg / dl Electronic Signature(s) Signed: 02/11/2021 4:45:38 PM By: Gretta Cool, BSN, RN, CWS, Kim RN, BSN Entered By: Gretta Cool, BSN, RN, CWS, Kim on 02/11/2021 10:05:17

## 2021-02-12 ENCOUNTER — Ambulatory Visit: Payer: Medicare HMO

## 2021-02-12 NOTE — Addendum Note (Signed)
Addended by: Ronaldo Miyamoto on: 02/12/2021 11:58 AM   Modules accepted: Orders

## 2021-02-14 DIAGNOSIS — E1142 Type 2 diabetes mellitus with diabetic polyneuropathy: Secondary | ICD-10-CM | POA: Diagnosis not present

## 2021-02-14 DIAGNOSIS — E1159 Type 2 diabetes mellitus with other circulatory complications: Secondary | ICD-10-CM | POA: Diagnosis not present

## 2021-02-14 DIAGNOSIS — I152 Hypertension secondary to endocrine disorders: Secondary | ICD-10-CM | POA: Diagnosis not present

## 2021-02-14 DIAGNOSIS — I4819 Other persistent atrial fibrillation: Secondary | ICD-10-CM | POA: Diagnosis not present

## 2021-02-14 DIAGNOSIS — I872 Venous insufficiency (chronic) (peripheral): Secondary | ICD-10-CM | POA: Diagnosis not present

## 2021-02-14 DIAGNOSIS — I69320 Aphasia following cerebral infarction: Secondary | ICD-10-CM | POA: Diagnosis not present

## 2021-02-14 DIAGNOSIS — E1152 Type 2 diabetes mellitus with diabetic peripheral angiopathy with gangrene: Secondary | ICD-10-CM | POA: Diagnosis not present

## 2021-02-14 DIAGNOSIS — I69319 Unspecified symptoms and signs involving cognitive functions following cerebral infarction: Secondary | ICD-10-CM | POA: Diagnosis not present

## 2021-02-14 DIAGNOSIS — G8194 Hemiplegia, unspecified affecting left nondominant side: Secondary | ICD-10-CM | POA: Diagnosis not present

## 2021-02-14 DIAGNOSIS — J449 Chronic obstructive pulmonary disease, unspecified: Secondary | ICD-10-CM | POA: Diagnosis not present

## 2021-02-17 DIAGNOSIS — I152 Hypertension secondary to endocrine disorders: Secondary | ICD-10-CM | POA: Diagnosis not present

## 2021-02-17 DIAGNOSIS — I69320 Aphasia following cerebral infarction: Secondary | ICD-10-CM | POA: Diagnosis not present

## 2021-02-17 DIAGNOSIS — I872 Venous insufficiency (chronic) (peripheral): Secondary | ICD-10-CM | POA: Diagnosis not present

## 2021-02-17 DIAGNOSIS — I69319 Unspecified symptoms and signs involving cognitive functions following cerebral infarction: Secondary | ICD-10-CM | POA: Diagnosis not present

## 2021-02-17 DIAGNOSIS — G8194 Hemiplegia, unspecified affecting left nondominant side: Secondary | ICD-10-CM | POA: Diagnosis not present

## 2021-02-17 DIAGNOSIS — I4819 Other persistent atrial fibrillation: Secondary | ICD-10-CM | POA: Diagnosis not present

## 2021-02-17 DIAGNOSIS — E1159 Type 2 diabetes mellitus with other circulatory complications: Secondary | ICD-10-CM | POA: Diagnosis not present

## 2021-02-17 DIAGNOSIS — E1142 Type 2 diabetes mellitus with diabetic polyneuropathy: Secondary | ICD-10-CM | POA: Diagnosis not present

## 2021-02-17 DIAGNOSIS — J449 Chronic obstructive pulmonary disease, unspecified: Secondary | ICD-10-CM | POA: Diagnosis not present

## 2021-02-17 DIAGNOSIS — E1152 Type 2 diabetes mellitus with diabetic peripheral angiopathy with gangrene: Secondary | ICD-10-CM | POA: Diagnosis not present

## 2021-02-18 ENCOUNTER — Ambulatory Visit: Payer: Medicare HMO

## 2021-02-19 ENCOUNTER — Ambulatory Visit: Payer: Medicare HMO

## 2021-02-20 ENCOUNTER — Ambulatory Visit (INDEPENDENT_AMBULATORY_CARE_PROVIDER_SITE_OTHER): Payer: Medicare HMO

## 2021-02-20 ENCOUNTER — Other Ambulatory Visit: Payer: Self-pay

## 2021-02-20 ENCOUNTER — Encounter: Payer: Medicare HMO | Attending: Physician Assistant | Admitting: Physician Assistant

## 2021-02-20 DIAGNOSIS — L97322 Non-pressure chronic ulcer of left ankle with fat layer exposed: Secondary | ICD-10-CM | POA: Insufficient documentation

## 2021-02-20 DIAGNOSIS — L97822 Non-pressure chronic ulcer of other part of left lower leg with fat layer exposed: Secondary | ICD-10-CM | POA: Diagnosis not present

## 2021-02-20 DIAGNOSIS — L97812 Non-pressure chronic ulcer of other part of right lower leg with fat layer exposed: Secondary | ICD-10-CM | POA: Insufficient documentation

## 2021-02-20 DIAGNOSIS — E1140 Type 2 diabetes mellitus with diabetic neuropathy, unspecified: Secondary | ICD-10-CM | POA: Insufficient documentation

## 2021-02-20 DIAGNOSIS — E1151 Type 2 diabetes mellitus with diabetic peripheral angiopathy without gangrene: Secondary | ICD-10-CM | POA: Diagnosis not present

## 2021-02-20 DIAGNOSIS — R350 Frequency of micturition: Secondary | ICD-10-CM

## 2021-02-20 DIAGNOSIS — E11621 Type 2 diabetes mellitus with foot ulcer: Secondary | ICD-10-CM | POA: Insufficient documentation

## 2021-02-20 DIAGNOSIS — L97522 Non-pressure chronic ulcer of other part of left foot with fat layer exposed: Secondary | ICD-10-CM | POA: Diagnosis not present

## 2021-02-20 DIAGNOSIS — I872 Venous insufficiency (chronic) (peripheral): Secondary | ICD-10-CM | POA: Insufficient documentation

## 2021-02-20 LAB — POCT URINALYSIS DIPSTICK
Appearance: NORMAL
Bilirubin, UA: NEGATIVE
Blood, UA: NEGATIVE
Glucose, UA: NEGATIVE
Ketones, UA: NEGATIVE
Leukocytes, UA: NEGATIVE
Nitrite, UA: NEGATIVE
Protein, UA: POSITIVE — AB
Spec Grav, UA: >=1.03 — AB (ref 1.010–1.025)
Urobilinogen, UA: 0.2 E.U./dL
pH, UA: 5.5 (ref 5.0–8.0)

## 2021-02-20 NOTE — Progress Notes (Addendum)
KIMALA, HORNE ([id number]) Visit Report for 02/20/2021 Chief Complaint Document Details Patient Name: VINNIE, BOBST Date of Service: 02/20/2021 11:15 AM Medical Record Number: [id number] Patient Account Number: [id number] Date of Birth/Sex: 1950/06/26 (70 y.o. F) Treating RN: Donnamarie Poag Primary Care Provider: [name], Olivia Mackie Other Clinician: Referring Provider: [name], Olivia Mackie Treating Provider/Extender: Skipper Cliche in Treatment: 35 Information Obtained from: Patient Chief Complaint Right Leg Ulcer and Right 4thToe Ulcer Electronic Signature(s) Signed: 02/20/2021 11:14:22 AM By: Worthy Keeler PA-C Entered By: Worthy Keeler on 02/20/2021 11:14:21 JUANA, HARALSON ([id number]) -------------------------------------------------------------------------------- Debridement Details Patient Name: [name] Date of Service: 02/20/2021 11:15 AM Medical Record Number: [id number] Patient Account Number: [id number] Date of Birth/Sex: Aug 18, 1950 (70 y.o. F) Treating RN: Donnamarie Poag Primary Care Provider: [name], Olivia Mackie Other Clinician: Referring Provider: [name], Olivia Mackie Treating Provider/Extender: Skipper Cliche in Treatment: 33 Debridement Performed for Wound #16 Left,Distal Toe Fourth Assessment: Performed By: Physician Tommie Sams., PA-C Debridement Type: Debridement Severity of Tissue Pre Debridement: Fat layer exposed Level of Consciousness (Pre- Awake and Alert procedure): Pre-procedure Verification/Time Out Yes - 11:51 Taken: Start Time: 11:52 Pain Control: Lidocaine Total Area Debrided (L x W): 0.3 (cm) x 0.3 (cm) = 0.09 (cm) Tissue and other material Non-Viable, Callus debrided: Level: Non-Viable Tissue Debridement Description: Selective/Open Wound Instrument: Curette Bleeding: None Response to Treatment: Procedure was tolerated well Level of Consciousness (Post- Awake and Alert procedure): Post Debridement  Measurements of Total Wound Length: (cm) 0.2 Width: (cm) 0.2 Depth: (cm) 0.2 Volume: (cm) 0.006 Character of Wound/Ulcer Post Debridement: Improved Severity of Tissue Post Debridement: Fat layer exposed Post Procedure Diagnosis Same as Pre-procedure Electronic Signature(s) Signed: 02/20/2021 3:46:30 PM By: Donnamarie Poag Signed: 02/20/2021 5:34:12 PM By: Worthy Keeler PA-C Entered By: Donnamarie Poag on 02/20/2021 11:53:12 Logiudice, [name] ([id number]) -------------------------------------------------------------------------------- HPI Details Patient Name: [name] Date of Service: 02/20/2021 11:15 AM Medical Record Number: [id number] Patient Account Number: [id number] Date of Birth/Sex: 04-01-1950 (70 y.o. F) Treating RN: Donnamarie Poag Primary Care Provider: [name], Olivia Mackie Other Clinician: Referring Provider: [name], Olivia Mackie Treating Provider/Extender: Skipper Cliche in Treatment: 101 History of Present Illness HPI Description: 06/28/20 upon evaluation today patient appears to be doing unfortunately somewhat poorly in regard to wounds that she has over her lower extremities. She has a wound on the left distal second toe, left posterior heel, and right anterior lower leg. With that being said all in all none of the wounds appear to be too bad the one that hurts her the most is actually the wound on the posterior heel which actually is also one of the smallest. Nonetheless I think there is some callus hiding what is really going on in this area. Fortunately there does not appear to be any obvious evidence of infection which is great news. Patient does have a history of diabetes mellitus type 2, chronic venous insufficiency, hypertension, history of DVTs, and is on long-term anticoagulant therapy, Eliquis. 07/05/20 upon evaluation today patient appears to be doing well currently with regard to her wounds. I feel like she is making progress which is great news and overall  there is no signs of active infection at this time. No fevers, chills, nausea, vomiting, or diarrhea. 4/29; patient has a only a small area remaining on the tip of her left second toe and a small area remaining on the left heel. The area on the right anterior lower leg is healed. They tell me that she had remote podiatric surgery on the  toes of the left foot however they are very fixed in position and I wonder whether they are making it difficult to relieve pressure in her foot wear. She is a type II diabetic with neuropathy as well. 08/01/2020 upon evaluation today patient appears to be doing well currently in regard to her wounds. In fact everything appears to be doing much better. The posterior aspect of her heel is actually giving her some trouble not the Achilles area but a small wound that we did not even have marked. In fact I had noted this before. Nonetheless we are going to addressed that today. 5/27; most of the patient's wounds are healed including the left Achilles heel left toe. Right forearm is also healed. She has a new abrasion posteriorly in the right elbow area just above the olecranon this happened today with an abrasion from her car door 6/15; the patient's wounds on the left Achilles and left second toe remain healed. The area on the right forearm is also just about healed in fact the skin I replaced last time is looking viable which is good. She had me look at the left heel again which is not in the area of the wound more towards the tip of the heel at the Achilles insertion site as well as the tip of the left foot second toe. In the Achilles area that is clearly is friction probably in bed at night. Not really near where the wound was. The left second toe remains healed although this is hammered and overhanging first toe also puts pressure in this area 09/13/2020 upon evaluation today patient appears to be doing well with regard to all previous wounds that she had. Everything is  completely healed. With that being said I do think that the patient unfortunately has a new skin tear on the right forearm which is due to her put a Band-Aid on it and then when it pulled off this caused some issues with skin tearing. Nonetheless I think that this needs to be addressed today. Hopefully will take too long to get this to heal. She is having some issues with pain in her heel but I think this is more related to be honest to her neuropathy as opposed to anything else. Readmission: 10/30/2020 upon evaluation today patient appears to be doing poorly in regard to her legs bilaterally. She is also having an issue with her fourth toe on the left. Fortunately there does not appear to be any signs of active infection at this time. No fevers, chills, nausea, vomiting, or diarrhea. It is actually been quite a while since we have seen her. In fact June 24 was the last time that she was here in the clinic. She tells me she did not come back because things were just doing "pretty well". 11/29/2020 upon evaluation today patient actually appears to be doing well with regard to her arms those are completely healed. Her right leg is still open but doing okay. The fourth toe of the left foot does show some signs here of having some issues with necrotic tissue of an open wound. This is where a toenail was removed by podiatry. Subsequently I do think that we need to go ahead and see what we can do about getting this cleared away so being try to get some healing going forward. 12/13/2020 upon evaluation today patient appears to be doing decently well in regard to her wound on the right leg. In general I think that she is making good  progress here. Unfortunately she continues to have significant issues with swelling in the left leg is now even more swollen at this point. For that reason I do believe she would be benefited by wrapping both sides although she is not interested in doing this. Overall I think  that she is definitely making some good progress here nonetheless in regard to the right leg left leg leave some to be desired. 12/27/2020 upon evaluation today patient appears to be doing well with regard to her wound in regard to the legs. Unfortunately the toe was not doing nearly as well. I am much more concerned about infection and osteomyelitis here. She had the MRI but right now were not seeing obvious evidence of redness spreading up to her foot this is good news I do not have the result of the MRI as of yet. I am good to place her on doxycycline however due to the fact that the wound does seem to be getting a little worse in regard to her toe. 01/14/2021 upon evaluation today patient appears to be doing well with regard to her ulcers. The right leg is doing well and even the toe seems to be doing well though she still has some evidence of bone exposed on the toe which does not appear to be doing quite as good as I would like to see just and the fact that is still not covered over new tissue but looks tremendously better compared to 2 weeks ago. Overall very pleased in that regard. 01/28/2021 upon evaluation today patient's leg on the left actually appears to have new wounds this is due to her deciding to shave yesterday. Fortunately there does not appear to be any signs of infection currently. This is good news. Nonetheless I do believe that with these new areas open it would be best to wrap her leg to try to make sure we get this under control sooner rather than later. With regard to her toe there still does appear to be some evidence of bone exposed this was covered over with callus I did remove that today. Nonetheless I think this is significantly smaller than previous. 02/11/2021 upon evaluation today patient appears to be doing okay in regard to her leg ulcers. I do not see anything obvious at this point this seems to be a complicating factor. I think that she is getting better. Were wrap  of the right leg not the left leg. With that being said the left fourth Spano, Malashia B. ([id number]) toe ulcer still does have evidence of being open there is some drainage still on top of the wound I think we can to try to see about using something like a collagen dressing today to see if that would be of benefit for her. She is in agreement with giving this a trial. Nonetheless I am concerned about the fact that she continues to have issues here with this not healing it is confirmed chronic osteomyelitis she has been on antibiotics. I am going to extend those today as well. That is Keflex and that will be for 1 additional month. Nonetheless if she is not doing significantly better by the next time I see her I think that the biggest thing is going to be for [place] to go ahead and see what we can do about getting her started with hyperbaric oxygen therapy to try to save the toe. 02/20/2021 upon evaluation today patient presents for follow-up she actually came in a little bit early as the home  health nurse was concerned about her toe becoming "gangrenous". With that being said the good news is there is [place] no evidence of this and in fact the toe seems to be doing better at this point. What was over the toe was an area that had bled significantly and then subsequently dried making it look much worse than where things actually stand. There is just a very small opening remaining in the toe. In regard to her legs everything appears to be healed bilaterally. Electronic Signature(s) Signed: 02/20/2021 4:49:08 PM By: Worthy Keeler PA-C Entered By: Worthy Keeler on 02/20/2021 16:49:08 KIRSTON, LUTY ([id number]) -------------------------------------------------------------------------------- Physical Exam Details Patient Name: [name] Date of Service: 02/20/2021 11:15 AM Medical Record Number: [id number] Patient Account Number: [id number] Date of Birth/Sex: 1950/06/25 (70 y.o. F) Treating  RN: Donnamarie Poag Primary Care Provider: [name], Olivia Mackie Other Clinician: Referring Provider: [name], Olivia Mackie Treating Provider/Extender: Skipper Cliche in Treatment: 41 Constitutional Well-nourished and well-hydrated in no acute distress. Respiratory normal breathing without difficulty. Psychiatric this patient is able to make decisions and demonstrates good insight into disease process. Alert and Oriented x 3. pleasant and cooperative. Notes Upon inspection patient's wound bed actually showed signs of good granulation and epithelization at this point. Fortunately I do not see any evidence of active infection locally nor systemically currently and overall I think that she is really doing quite well. The toe is very tiny and is the only wound remaining at this point. Electronic Signature(s) Signed: 02/20/2021 4:49:31 PM By: Worthy Keeler PA-C Entered By: Worthy Keeler on 02/20/2021 16:49:31 ARTIE, TAKAYAMA ([id number]) -------------------------------------------------------------------------------- Physician Orders Details Patient Name: [name] Date of Service: 02/20/2021 11:15 AM Medical Record Number: [id number] Patient Account Number: [id number] Date of Birth/Sex: 04/02/1950 (70 y.o. F) Treating RN: Donnamarie Poag Primary Care Provider: [name], Olivia Mackie Other Clinician: Referring Provider: [name], Olivia Mackie Treating Provider/Extender: Skipper Cliche in Treatment: 25 Verbal / Phone Orders: No Diagnosis Coding ICD-10 Coding Code Description E11.621 Type 2 diabetes mellitus with foot ulcer M86.572 Other chronic hematogenous osteomyelitis, left ankle and foot L97.522 Non-pressure chronic ulcer of other part of left foot with fat layer exposed I87.2 Venous insufficiency (chronic) (peripheral) L97.812 Non-pressure chronic ulcer of other part of right lower leg with fat layer exposed L97.822 Non-pressure chronic ulcer of other part of left lower  leg with fat layer exposed Z79.01 Long term (current) use of anticoagulants Follow-up Appointments o Return Appointment in 1 week. o Nurse Visit as needed [place] for wound care. May utilize formulary equivalent dressing for wound treatment orders unless otherwise specified. Home Health Nurse may visit PRN to address patientos wound care needs. o Scheduled days for dressing changes to be completed; exception, patient has scheduled wound care visit that day. o **Please direct any NON-WOUND related issues/requests for orders to patient's Primary Care Physician. **If current dressing causes regression in wound condition, may D/C ordered dressing product/s and apply Normal Saline Moist Dressing daily until next [place] or Other MD appointment. **Notify [facility] of regression in wound condition at [phone]. Bathing/ Shower/ Hygiene o May shower with wound dressing protected with water repellent cover or cast protector. o No tub bath. o Other: - Use AandD ointment to RLL with protective dressing and leave intact for several days Be careful of new skin to protect Anesthetic (Use 'Patient Medications' Section for Anesthetic Order Entry) o Lidocaine applied to wound bed Edema  Control - Lymphedema / Segmental Compressive Device / Other o Patient to wear own compression stockings. Remove compression stockings every night before going to bed and put on every morning when getting up. o Elevate, Exercise Daily and Avoid Standing for Long Periods of Time. o Elevate legs to the level of the heart and pump ankles as often as possible o Elevate leg(s) parallel to the floor when sitting. o DO YOUR BEST to sleep in the bed at night. DO NOT sleep in your recliner. Long hours of sitting in a recliner leads to swelling of the legs and/or potential wounds on your backside. Off-Loading Wound  #16 Left,Distal Toe Fourth o Open toe surgical shoe - keep pressure off wound/toes o Other: - suggest float heels when in bed with a pillow to keep pressure off of heels; choose shoewear for right foot to not rub foot Additional Orders / Instructions o Follow Nutritious Diet and Increase Protein Intake Medications-Please add to medication list. o P.O. Antibiotics - Continue as directed Wound Treatment Uhrich, Mayeli B. ([id number]) Wound #16 - Toe Fourth Wound Laterality: Left, Distal Cleanser: Soap and Water (Home Health) (Generic) 3 x Per Week/15 Days Discharge Instructions: Gently cleanse wound with antibacterial soap, rinse and pat dry prior to dressing wounds Cleanser: Wound Cleanser (Home Health) (Generic) 3 x Per Week/15 Days Discharge Instructions: Wash your hands with soap and water. Remove old dressing, discard into plastic bag and place into trash. Cleanse the wound with Wound Cleanser prior to applying a clean dressing using gauze sponges, not tissues or cotton balls. Do not scrub or use excessive force. Pat dry using gauze sponges, not tissue or cotton balls. Primary Dressing: Prisma 4.34 (in) (Home Health) (Generic) 3 x Per Week/15 Days Discharge Instructions: Moisten w/normal saline or sterile water; Cover wound as directed. Do not remove from wound bed. Add-Ons: Coverlet Latex-Free Fabric Adhesive Dressings (Home Health) (Generic) 3 x Per Week/15 Days Discharge Instructions: Elastic Knuckle Bandage Electronic Signature(s) Signed: 02/20/2021 3:46:30 PM By: Donnamarie Poag Signed: 02/20/2021 5:34:12 PM By: Worthy Keeler PA-C Entered By: Donnamarie Poag on 02/20/2021 11:57:12 Lymon, [name] ([id number]) -------------------------------------------------------------------------------- Problem List Details Patient Name: [name] Date of Service: 02/20/2021 11:15 AM Medical Record Number: [id number] Patient Account Number: [id number] Date of Birth/Sex: [birth year] (70  y.o. F) Treating RN: Donnamarie Poag Primary Care Provider: [name], Olivia Mackie Other Clinician: Referring Provider: [name], Olivia Mackie Treating Provider/Extender: Skipper Cliche in Treatment: 34 Active Problems ICD-10 Encounter Code Description Active Date MDM Diagnosis E11.621 Type 2 diabetes mellitus with foot ulcer 06/28/2020 No Yes M86.572 Other chronic hematogenous osteomyelitis, left ankle and foot 02/11/2021 No Yes L97.522 Non-pressure chronic ulcer of other part of left foot with fat layer 06/28/2020 No Yes exposed I87.2 Venous insufficiency (chronic) (peripheral) 06/28/2020 No Yes L97.812 Non-pressure chronic ulcer of other part of right lower leg with fat layer 06/28/2020 No Yes exposed L97.822 Non-pressure chronic ulcer of other part of left lower leg with fat layer 02/11/2021 No Yes exposed Z79.01 Long term (current) use of anticoagulants 06/28/2020 No Yes Inactive Problems ICD-10 Code Description Active Date Inactive Date I10 Essential (primary) hypertension 06/28/2020 06/28/2020 Z86.718 Personal history of other venous thrombosis and embolism 06/28/2020 06/28/2020 Resolved Problems ICD-10 Code Description Active Date Resolved Date L97.322 Non-pressure chronic ulcer of left ankle with fat layer exposed 06/28/2020 06/28/2020 S50.811A Abrasion of right forearm, initial encounter 10/30/2020 10/30/2020 MIKALYN, HERMIDA ([id number]) S50.311D Abrasion of right elbow, subsequent encounter 08/16/2020 08/16/2020 Electronic Signature(s) Signed: 02/20/2021 11:14:07 AM By: Joaquim Lai  III, Raysa Bosak PA-C Entered By: Worthy Keeler on 02/20/2021 11:14:02 IVALENE, PLATTE ([id number]) -------------------------------------------------------------------------------- Progress Note Details Patient Name: LORRIN, BODNER Date of Service: 02/20/2021 11:15 AM Medical Record Number: [id number] Patient Account Number: [id number] Date of Birth/Sex: [birth year] (70 y.o. F) Treating RN: Donnamarie Poag Primary Care  Provider: [name], Olivia Mackie Other Clinician: Referring Provider: [name], Olivia Mackie Treating Provider/Extender: Skipper Cliche in Treatment: 33 Subjective Chief Complaint Information obtained from Patient Right Leg Ulcer and Right 4thToe Ulcer History of Present Illness (HPI) 06/28/20 upon evaluation today patient appears to be doing unfortunately somewhat poorly in regard to wounds that she has over her lower extremities. She has a wound on the left distal second toe, left posterior heel, and right anterior lower leg. With that being said all in all none of the wounds appear to be too bad the one that hurts her the most is actually the wound on the posterior heel which actually is also one of the smallest. Nonetheless I think there is some callus hiding what is really going on in this area. Fortunately there does not appear to be any obvious evidence of infection which is great news. Patient does have a history of diabetes mellitus type 2, chronic venous insufficiency, hypertension, history of DVTs, and is on long-term anticoagulant therapy, Eliquis. 07/05/20 upon evaluation today patient appears to be doing well currently with regard to her wounds. I feel like she is making progress which is great news and overall there is no signs of active infection at this time. No fevers, chills, nausea, vomiting, or diarrhea. 4/29; patient has a only a small area remaining on the tip of her left second toe and a small area remaining on the left heel. The area on the right anterior lower leg is healed. They tell me that she had remote podiatric surgery on the toes of the left foot however they are very fixed in position and I wonder whether they are making it difficult to relieve pressure in her foot wear. She is a type II diabetic with neuropathy as well. 08/01/2020 upon evaluation today patient appears to be doing well currently in regard to her wounds. In fact everything appears to be doing  much better. The posterior aspect of her heel is actually giving her some trouble not the Achilles area but a small wound that we did not even have marked. In fact I had noted this before. Nonetheless we are going to addressed that today. 5/27; most of the patient's wounds are healed including the left Achilles heel left toe. Right forearm is also healed. She has a new abrasion posteriorly in the right elbow area just above the olecranon this happened today with an abrasion from her car door 6/15; the patient's wounds on the left Achilles and left second toe remain healed. The area on the right forearm is also just about healed in fact the skin I replaced last time is looking viable which is good. She had me look at the left heel again which is not in the area of the wound more towards the tip of the heel at the Achilles insertion site as well as the tip of the left foot second toe. In the Achilles area that is clearly is friction probably in bed at night. Not really near where the wound was. The left second toe remains healed although this is hammered and overhanging first toe also puts pressure in this area 09/13/2020 upon evaluation today patient appears to be doing well with  regard to all previous wounds that she had. Everything is completely healed. With that being said I do think that the patient unfortunately has a new skin tear on the right forearm which is due to her put a Band-Aid on it and then when it pulled off this caused some issues with skin tearing. Nonetheless I think that this needs to be addressed today. Hopefully will take too long to get this to heal. She is having some issues with pain in her heel but I think this is more related to be honest to her neuropathy as opposed to anything else. Readmission: 10/30/2020 upon evaluation today patient appears to be doing poorly in regard to her legs bilaterally. She is also having an issue with her fourth toe on the left. Fortunately  there does not appear to be any signs of active infection at this time. No fevers, chills, nausea, vomiting, or diarrhea. It is actually been quite a while since we have seen her. In fact June 24 was the last time that she was here in the clinic. She tells me she did not come back because things were just doing "pretty well". 11/29/2020 upon evaluation today patient actually appears to be doing well with regard to her arms those are completely healed. Her right leg is still open but doing okay. The fourth toe of the left foot does show some signs here of having some issues with necrotic tissue of an open wound. This is where a toenail was removed by podiatry. Subsequently I do think that we need to go ahead and see what we can do about getting this cleared away so being try to get some healing going forward. 12/13/2020 upon evaluation today patient appears to be doing decently well in regard to her wound on the right leg. In general I think that she is making good progress here. Unfortunately she continues to have significant issues with swelling in the left leg is now even more swollen at this point. For that reason I do believe she would be benefited by wrapping both sides although she is not interested in doing this. Overall I think that she is definitely making some good progress here nonetheless in regard to the right leg left leg leave some to be desired. 12/27/2020 upon evaluation today patient appears to be doing well with regard to her wound in regard to the legs. Unfortunately the toe was not doing nearly as well. I am much more concerned about infection and osteomyelitis here. She had the MRI but right now were not seeing obvious evidence of redness spreading up to her foot this is good news I do not have the result of the MRI as of yet. I am good to place her on doxycycline however due to the fact that the wound does seem to be getting a little worse in regard to her toe. 01/14/2021 upon  evaluation today patient appears to be doing well with regard to her ulcers. The right leg is doing well and even the toe seems to be doing well though she still has some evidence of bone exposed on the toe which does not appear to be doing quite as good as I would like to see just and the fact that is still not covered over new tissue but looks tremendously better compared to 2 weeks ago. Overall very pleased in that regard. 01/28/2021 upon evaluation today patient's leg on the left actually appears to have new wounds this is due to her deciding to  shave yesterday. Fortunately there does not appear to be any signs of infection currently. This is good news. Nonetheless I do believe that with these new areas open it would be best to wrap her leg to try to make sure we get this under control sooner rather than later. With regard to her toe there still does Burlison, Daryana B. ([id number]) appear to be some evidence of bone exposed this was covered over with callus I did remove that today. Nonetheless I think this is significantly smaller than previous. 02/11/2021 upon evaluation today patient appears to be doing okay in regard to her leg ulcers. I do not see anything obvious at this point this seems to be a complicating factor. I think that she is getting better. Were wrap of the right leg not the left leg. With that being said the left fourth toe ulcer still does have evidence of being open there is some drainage still on top of the wound I think we can to try to see about using something like a collagen dressing today to see if that would be of benefit for her. She is in agreement with giving this a trial. Nonetheless I am concerned about the fact that she continues to have issues here with this not healing it is confirmed chronic osteomyelitis she has been on antibiotics. I am going to extend those today as well. That is Keflex and that will be for 1 additional month. Nonetheless if she is not  doing significantly better by the next time I see her I think that the biggest thing is going to be for [place] to go ahead and see what we can do about getting her started with hyperbaric oxygen therapy to try to save the toe. 02/20/2021 upon evaluation today patient presents for follow-up she actually came in a little bit early as the home health nurse was concerned about her toe becoming "gangrenous". With that being said the good news is there is [place] no evidence of this and in fact the toe seems to be doing better at this point. What was over the toe was an area that had bled significantly and then subsequently dried making it look much worse than where things actually stand. There is just a very small opening remaining in the toe. In regard to her legs everything appears to be healed bilaterally. Objective Constitutional Well-nourished and well-hydrated in no acute distress. Vitals Time Taken: 11:26 AM, Height: 65.5 in, Weight: 240 lbs, BMI: 39.3, Temperature: 97.8 F, Pulse: 86 bpm, Respiratory Rate: 18 breaths/min, Blood Pressure: 155/77 mmHg. Respiratory normal breathing without difficulty. Psychiatric this patient is able to make decisions and demonstrates good insight into disease process. Alert and Oriented x 3. pleasant and cooperative. General Notes: Upon inspection patient's wound bed actually showed signs of good granulation and epithelization at this point. Fortunately I do not see any evidence of active infection locally nor systemically currently and overall I think that she is really doing quite well. The toe is very tiny and is the only wound remaining at this point. Integumentary (Hair, Skin) Wound #13 status is Healed - Epithelialized. Original cause of wound was Gradually Appeared. The date acquired was: 10/21/2020. The wound has been in treatment 16 weeks. The wound is located on the Right,Midline Lower Leg. The wound measures 0cm length x 0cm width x 0cm depth; 0cm^2 area  and 0cm^3 volume. There is no tunneling or undermining noted. There is a none present amount of drainage noted. There is no granulation  within the wound bed. There is no necrotic tissue within the wound bed. Wound #16 status is Open. Original cause of wound was Pressure Injury. The date acquired was: 10/31/2020. The wound has been in treatment 11 weeks. The wound is located on the Left,Distal Toe Fourth. The wound measures 0.2cm length x 0.2cm width x 0.2cm depth; 0.031cm^2 area and 0.006cm^3 volume. There is bone and Fat Layer (Subcutaneous Tissue) exposed. There is no tunneling or undermining noted. There is a medium amount of serosanguineous drainage noted. The wound margin is flat and intact. There is large (67-100%) red, pink granulation within the wound bed. There is a small (1-33%) amount of necrotic tissue within the wound bed including Adherent Slough. Wound #17 status is Healed - Epithelialized. Original cause of wound was Blister. The date acquired was: 12/13/2020. The wound has been in treatment 9 weeks. The wound is located on the Left Lower Leg. The wound measures 0cm length x 0cm width x 0cm depth; 0cm^2 area and 0cm^3 volume. There is no tunneling or undermining noted. There is a none present amount of drainage noted. There is no granulation within the wound bed. There is no necrotic tissue within the wound bed. 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 ([id number]) Long term (current) use of anticoagulants Procedures Wound #16 Pre-procedure diagnosis of Wound #16 is a Diabetic  Wound/Ulcer of the Lower Extremity located on the Left,Distal Toe Fourth .Severity of Tissue Pre Debridement is: Fat layer exposed. There was a Selective/Open Wound Non-Viable Tissue Debridement with a total area of 0.09 sq cm performed by Tommie Sams., PA-C. With the following instrument(s): Curette to remove Non-Viable tissue/material. Material removed includes Callus after achieving pain control using Lidocaine. A time out was conducted at 11:51, prior to the start of the procedure. There was no bleeding. The procedure was tolerated well. Post Debridement Measurements: 0.2cm length x 0.2cm width x 0.2cm depth; 0.006cm^3 volume. Character of Wound/Ulcer Post Debridement is improved. Severity of Tissue Post Debridement is: Fat layer exposed. Post procedure Diagnosis Wound #16: Same as Pre-Procedure Wound #13 Pre-procedure diagnosis of Wound #13 is a Venous Leg Ulcer located on the Right,Midline Lower Leg . There was a Three Layer Compression Therapy Procedure by Donnamarie Poag, RN. Post procedure Diagnosis Wound #13: Same as Pre-Procedure Plan Follow-up Appointments: Return Appointment in 1 week. Nurse Visit as needed Home Health: [facility]: - [place] for wound care. May utilize formulary equivalent dressing for wound treatment orders unless otherwise specified. Home Health Nurse may visit PRN to address patient s wound care needs. Scheduled days for dressing changes to be completed; exception, patient has scheduled wound care visit that day. **Please direct any NON-WOUND related issues/requests for orders to patient's Primary Care Physician. **If current dressing causes regression in wound condition, may D/C ordered dressing product/s and apply Normal Saline Moist Dressing daily until next [place] or Other MD appointment. **Notify [facility] of regression in wound condition at [phone]. Bathing/ Shower/ Hygiene: May shower with wound  dressing protected with water repellent cover or cast protector. No tub bath. Other: - Use AandD ointment to RLL with  protective dressing and leave intact for several days Be careful of new skin to protect Anesthetic (Use 'Patient Medications' Section for Anesthetic Order Entry): Lidocaine applied to wound bed Edema Control - Lymphedema / Segmental Compressive Device / Other: Patient to wear own compression stockings. Remove compression stockings every night before going to bed and put on every morning when getting up. Elevate, Exercise Daily and Avoid Standing for Long Periods of Time. Elevate legs to the level of the heart and pump ankles as often as possible Elevate leg(s) parallel to the floor when sitting. DO YOUR BEST to sleep in the bed at night. DO NOT sleep in your recliner. Long hours of sitting in a recliner leads to swelling of the legs and/or potential wounds on your backside. Off-Loading: Wound #16 Left,Distal Toe Fourth: Open toe surgical shoe - keep pressure off wound/toes Other: - suggest float heels when in bed with a pillow to keep pressure off of heels; choose shoewear for right foot to not rub foot Additional Orders / Instructions: Follow Nutritious Diet and Increase Protein Intake Medications-Please add to medication list.: P.O. Antibiotics - Continue as directed WOUND #16: - Toe Fourth Wound Laterality: Left, Distal Cleanser: Soap and Water (Home Health) (Generic) 3 x Per Week/15 Days Discharge Instructions: Gently cleanse wound with antibacterial soap, rinse and pat dry prior to dressing wounds Cleanser: Wound Cleanser (Home Health) (Generic) 3 x Per Week/15 Days Discharge Instructions: Wash your hands with soap and water. Remove old dressing, discard into plastic bag and place into trash. Cleanse the wound with Wound Cleanser prior to applying a clean dressing using gauze sponges, not tissues or cotton balls. Do not scrub or use excessive force. Pat dry using  gauze sponges, not tissue or cotton balls. Primary Dressing: Prisma 4.34 (in) (Home Health) (Generic) 3 x Per Week/15 Days Discharge Instructions: Moisten w/normal saline or sterile water; Cover wound as directed. Do not remove from wound bed. Add-Ons: Coverlet Latex-Free Fabric Adhesive Dressings (Home Health) (Generic) 3 x Per Week/15 Days Discharge Instructions: Elastic Knuckle Bandage [place], Navayah B. ([id number]) 1. I would recommend that we go ahead and continue with the wound care measures as before with regard to the collagen for the toe I think this is still the best way to go. 2. I am also can recommend that we have the patient continue with the dressing changes 3 times per week. 3. I am also can recommend that she continue to utilize her compression. Obviously I think this is still of utmost importance she will go to a compression sock on the right leg as well. We will see patient back for reevaluation in 1 week here in the clinic. If anything worsens or changes patient will contact our office for additional recommendations. Electronic Signature(s) Signed: 02/20/2021 4:50:08 PM By: Worthy Keeler PA-C Entered By: Worthy Keeler on 02/20/2021 16:50:08 AMARIE, VILES ([id number]) -------------------------------------------------------------------------------- SuperBill Details Patient Name: [name] Date of Service: 02/20/2021 Medical Record Number: [id number] Patient Account Number: [id number] Date of Birth/Sex: [birth year] (70 y.o. F) Treating RN: Donnamarie Poag Primary Care Provider: [name], Olivia Mackie Other Clinician: Referring Provider: [name], Olivia Mackie Treating Provider/Extender: Skipper Cliche in Treatment: 33 Diagnosis Coding ICD-10 Codes Code Description E11.621 Type 2 diabetes mellitus with foot ulcer M86.572 Other chronic hematogenous osteomyelitis, left ankle and foot L97.522 Non-pressure chronic ulcer of other part of left foot with fat layer  exposed I87.2 Venous insufficiency (chronic) (peripheral) L97.812 Non-pressure chronic ulcer of other part of right lower leg with fat  layer exposed L97.822 Non-pressure chronic ulcer of other part of left lower leg with fat layer exposed Z79.01 Long term (current) use of anticoagulants Facility Procedures The patient participates with Medicare or their insurance follows the Medicare Facility Guidelines: CPT4 Code Description Modifier Quantity [id number] [phone] - DEBRIDE WOUND 1ST 20 SQ CM OR < 1 The patient participates with Medicare or their insurance follows the Medicare Facility Guidelines: ICD-10 Diagnosis Description L97.522 Non-pressure chronic ulcer of other part of left foot with fat layer exposed Physician Procedures CPT4 Code: 6606301 Description: 97597 - WC PHYS DEBR WO ANESTH 20 SQ CM Modifier: Quantity: 1 CPT4 Code: Description: ICD-10 Diagnosis Description L97.522 Non-pressure chronic ulcer of other part of left foot with fat layer expo Modifier: sed Quantity: Electronic Signature(s) Signed: 02/20/2021 4:50:28 PM By: Worthy Keeler PA-C Previous Signature: 02/20/2021 3:46:30 PM Version By: Donnamarie Poag Entered By: Worthy Keeler on 02/20/2021 16:50:27

## 2021-02-20 NOTE — Progress Notes (Addendum)
[name], [name] ([id number]) Visit Report for 02/20/2021 Arrival Information Details Patient Name: [name], [name] Date of Service: 02/20/2021 11:15 AM Medical Record Number: [id number] Patient Account Number: [id number] Date of Birth/Sex: 1951-01-01 (70 y.o. F) Treating RN: Donnamarie Poag Primary Care Nakota Elsen: [name], Olivia Mackie Other Clinician: Referring Javeah Loeza: [name], Olivia Mackie Treating Zigmond Trela/Extender: Skipper Cliche in Treatment: 53 Visit Information History Since Last Visit Added or deleted any medications: No Patient Arrived: Wheel Chair Had a fall or experienced change in No Arrival Time: 11:25 activities of daily living that may affect Accompanied By: husband risk of falls: Transfer Assistance: EasyP[employer] Patient Lift Hospitalized since last visit: No Patient Identification Verified: Yes Has Dressing in Place as Prescribed: Yes Secondary Verification Process Completed: Yes Has Compression in Place as Prescribed: Yes Patient Has Alerts: Yes Pain Present Now: Yes Patient Alerts: Patient on Blood Thinner Eliquis Diabetic II 06/2020 ABI L)1.22 R)1.14 Electronic Signature(s) Signed: 02/20/2021 3:46:30 PM By: Donnamarie Poag Entered By: Donnamarie Poag on 02/20/2021 11:26:41 Kallman, [name] ([id number]) -------------------------------------------------------------------------------- Compression Therapy Details Patient Name: [name] Date of Service: 02/20/2021 11:15 AM Medical Record Number: [id number] Patient Account Number: [id number] Date of Birth/Sex: 08-18-1950 (70 y.o. F) Treating RN: Donnamarie Poag Primary Care Tranika Scholler: [name], Olivia Mackie Other Clinician: Referring Vestal Markin: [name], Olivia Mackie Treating Larue Drawdy/Extender: Skipper Cliche in Treatment: 33 Compression Therapy Performed for Wound Assessment: Wound #13 Right,Midline Lower Leg Performed By: Junius Argyle, RN Compression Type: Three Layer Post Procedure Diagnosis Same as  Pre-procedure Electronic Signature(s) Signed: 02/20/2021 3:46:30 PM By: Donnamarie Poag Entered By: Donnamarie Poag on 02/20/2021 11:47:00 [name] ([id number]) -------------------------------------------------------------------------------- Encounter Discharge Information Details Patient Name: [name] Date of Service: 02/20/2021 11:15 AM Medical Record Number: [id number] Patient Account Number: [id number] Date of Birth/Sex: 29-Apr-1950 (70 y.o. F) Treating RN: Donnamarie Poag Primary Care Franklin Clapsaddle: [name], Olivia Mackie Other Clinician: Referring Gloria Lambertson: [name], Olivia Mackie Treating Glenroy Crossen/Extender: Skipper Cliche in Treatment: 57 Encounter Discharge Information Items Post Procedure Vitals Discharge Condition: Stable Temperature (F): 97.8 Ambulatory Status: Wheelchair Pulse (bpm): 86 Discharge Destination: Home Respiratory Rate (breaths/min): 18 Transportation: Private Auto Blood Pressure (mmHg): 155/77 Accompanied By: self Schedule Follow-up Appointment: Yes Clinical Summary of Care: Electronic Signature(s) Signed: 02/20/2021 3:46:30 PM By: Donnamarie Poag Entered By: Donnamarie Poag on 02/20/2021 12:05:54 [name] ([id number]) -------------------------------------------------------------------------------- Lower Extremity Assessment Details Patient Name: [name] Date of Service: 02/20/2021 11:15 AM Medical Record Number: [id number] Patient Account Number: [id number] Date of Birth/Sex: 1951/01/22 (70 y.o. F) Treating RN: Donnamarie Poag Primary Care Yosef Krogh: [name], Olivia Mackie Other Clinician: Referring Captola Teschner: [name], Olivia Mackie Treating Emmily Pellegrin/Extender: Skipper Cliche in Treatment: 33 Edema Assessment Assessed: [Left: Yes] [Right: Yes] Edema: [Left: No] [Right: No] Calf Left: Right: Point of Measurement: 31 cm From Medial Instep 30 cm 32 cm Ankle Left: Right: Point of Measurement: 11 cm From Medial Instep 17.5 cm 17.5 cm Vascular  Assessment Pulses: Dorsalis Pedis Palpable: [Left:Yes] [Right:Yes] Electronic Signature(s) Signed: 02/20/2021 3:46:30 PM By: Donnamarie Poag Entered By: Donnamarie Poag on 02/20/2021 11:44:12 [name], [name] ([id number]) -------------------------------------------------------------------------------- Multi Wound Chart Details Patient Name: [name] Date of Service: 02/20/2021 11:15 AM Medical Record Number: [id number] Patient Account Number: [id number] Date of Birth/Sex: 06/04/1950 (70 y.o. F) Treating RN: Donnamarie Poag Primary Care Hunter Pinkard: [name], Olivia Mackie Other Clinician: Referring Yianni Skilling: [name], Olivia Mackie Treating Nicky Milhouse/Extender: Skipper Cliche in Treatment: 33 Vital Signs Height(in): 65.5 Pulse(bpm): 86 Weight(lbs): 240 Blood Pressure(mmHg): 155/77 Body Mass Index(BMI): 39 Temperature(F): 97.8 Respiratory Rate(breaths/min): 18 Photos: Wound Location: Right, Midline Lower Leg Left,  Distal Toe Fourth Left Lower Leg Wounding Event: Gradually Appeared Pressure Injury Blister Primary Etiology: Venous Leg Ulcer Diabetic Wound/Ulcer of the Lower Venous Leg Ulcer Extremity Comorbid History: Asthma, Pneumothorax, Asthma, Pneumothorax, Asthma, Pneumothorax, Arrhythmia, Hypertension, Type II Arrhythmia, Hypertension, Type II Arrhythmia, Hypertension, Type II Diabetes, Gout, Osteoarthritis, Diabetes, Gout, Osteoarthritis, Diabetes, Gout, Osteoarthritis, Neuropathy Neuropathy Neuropathy Date Acquired: 10/21/2020 10/31/2020 12/13/2020 Weeks of Treatment: 16 11 9  Wound Status: Open Open Healed - Epithelialized Measurements L x W x D (cm) 0.5x0.5x0.1 0.2x0.2x0.2 0x0x0 Area (cm) : 0.196 0.031 0 Volume (cm) : 0.02 0.006 0 % Reduction in Area: 95.80% 96.10% 100.00% % Reduction in Volume: 95.70% 92.40% 100.00% Classification: Full Thickness Without Exposed Grade 3 Full Thickness Without Exposed Support Structures Support Structures Exudate Amount: Medium Medium None  Present Exudate Type: Serosanguineous Serosanguineous N/A Exudate Color: red, brown red, brown N/A Wound Margin: N/A Flat and Intact N/A Granulation Amount: None Present (0%) Large (67-100%) None Present (0%) Granulation Quality: N/A Red, Pink N/A Necrotic Amount: Large (67-100%) Small (1-33%) None Present (0%) Necrotic Tissue: Eschar Adherent Slough N/A Exposed Structures: Fat Layer (Subcutaneous Tissue): Fat Layer (Subcutaneous Tissue): N/A Yes Yes Fascia: No Bone: Yes Tendon: No Fascia: No Muscle: No Tendon: No Joint: No Muscle: No Bone: No Joint: No Treatment Notes Electronic Signature(s) Signed: 02/20/2021 3:46:30 PM By: Donnamarie Poag Entered By: Donnamarie Poag on 02/20/2021 11:46:18 Nogueira, [name] ([id number]) [name], [name] ([id number]) -------------------------------------------------------------------------------- Lindy Details Patient Name: [name] Date of Service: 02/20/2021 11:15 AM Medical Record Number: [id number] Patient Account Number: [id number] Date of Birth/Sex: Aug 23, 1950 (70 y.o. F) Treating RN: Donnamarie Poag Primary Care Eleyna Brugh: [name], Olivia Mackie Other Clinician: Referring Dmario Russom: [name], Olivia Mackie Treating Antonia Jicha/Extender: Skipper Cliche in Treatment: 40 Active Inactive Electronic Signature(s) Signed: 02/20/2021 3:46:30 PM By: Donnamarie Poag Entered By: Donnamarie Poag on 02/20/2021 11:44:42 [name], [name] ([id number]) -------------------------------------------------------------------------------- Pain Assessment Details Patient Name: [name] Date of Service: 02/20/2021 11:15 AM Medical Record Number: [id number] Patient Account Number: [id number] Date of Birth/Sex: 1951/02/28 (70 y.o. F) Treating RN: Donnamarie Poag Primary Care Reedy Biernat: [name], Olivia Mackie Other Clinician: Referring Lexxus Underhill: [name], Olivia Mackie Treating Anikah Hogge/Extender: Skipper Cliche in Treatment: 76 Active  Problems Location of Pain Severity and Description of Pain Patient Has Paino Yes Site Locations Pain Location: Generalized Pain Rate the pain. Current Pain Level: 7 Pain Management and Medication Current Pain Management: Notes stated left shoulder pain with chronic ortho issue Electronic Signature(s) Signed: 02/20/2021 3:46:30 PM By: Donnamarie Poag Entered By: Donnamarie Poag on 02/20/2021 11:28:16 [name] ([id number]) -------------------------------------------------------------------------------- Patient/Caregiver Education Details Patient Name: [name] Date of Service: 02/20/2021 11:15 AM Medical Record Number: [id number] Patient Account Number: [id number] Date of Birth/Gender: 18-Jul-1950 (70 y.o. F) Treating RN: Donnamarie Poag Primary Care Physician: [name], Olivia Mackie Other Clinician: Referring Physician: [name], Olivia Mackie Treating Physician/Extender: Skipper Cliche in Treatment: 16 Education Assessment Education Provided To: Patient Education Topics Provided Basic Hygiene: Wound Debridement: Wound/Skin Impairment: Electronic Signature(s) Signed: 02/20/2021 3:46:30 PM By: Donnamarie Poag Entered By: Donnamarie Poag on 02/20/2021 12:04:43 [name] ([id number]) -------------------------------------------------------------------------------- Wound Assessment Details Patient Name: [name] Date of Service: 02/20/2021 11:15 AM Medical Record Number: [id number] Patient Account Number: [id number] Date of Birth/Sex: 1950-07-13 (70 y.o. F) Treating RN: Donnamarie Poag Primary Care Abdulhadi Stopa: [name], Olivia Mackie Other Clinician: Referring Jarely Juncaj: [name], Olivia Mackie Treating Brena Windsor/Extender: Skipper Cliche in Treatment: 33 Wound Status Wound Number: 13 Primary Venous Leg Ulcer Etiology: Wound Location: Right, Midline Lower Leg Wound Healed - Epithelialized Wounding Event:  Gradually Appeared Status: Date Acquired: 10/21/2020 Comorbid  Asthma, Pneumothorax, Arrhythmia, Hypertension, Type Weeks Of Treatment: 16 History: II Diabetes, Gout, Osteoarthritis, Neuropathy Clustered Wound: No Photos Wound Measurements Length: (cm) 0 Width: (cm) 0 Depth: (cm) 0 Area: (cm) 0 Volume: (cm) 0 % Reduction in Area: 100% % Reduction in Volume: 100% Tunneling: No Undermining: No Wound Description Classification: Full Thickness Without Exposed Support Structure Exudate Amount: None Present s Foul Odor After Cleansing: No Slough/Fibrino No Wound Bed Granulation Amount: None Present (0%) Exposed Structure Necrotic Amount: None Present (0%) Fascia Exposed: No Fat Layer (Subcutaneous Tissue) Exposed: No Tendon Exposed: No Muscle Exposed: No Joint Exposed: No Bone Exposed: No Treatment Notes Wound #13 (Lower Leg) Wound Laterality: Right, Midline Cleanser Peri-Wound Care Topical Primary Dressing CHERICA, HEIDEN ([id number]) Secondary Dressing Secured With Compression Wrap Compression Stockings Add-Ons Electronic Signature(s) Signed: 02/20/2021 3:46:30 PM By: Donnamarie Poag Entered By: Donnamarie Poag on 02/20/2021 11:51:52 [name], [name] ([id number]) -------------------------------------------------------------------------------- Wound Assessment Details Patient Name: [name] Date of Service: 02/20/2021 11:15 AM Medical Record Number: [id number] Patient Account Number: [id number] Date of Birth/Sex: Nov 04, 1950 (70 y.o. F) Treating RN: Donnamarie Poag Primary Care Azlynn Mitnick: [name], Olivia Mackie Other Clinician: Referring Micheala Morissette: [name], Olivia Mackie Treating Shiro Ellerman/Extender: Skipper Cliche in Treatment: 33 Wound Status Wound Number: 16 Primary Diabetic Wound/Ulcer of the Lower Extremity Etiology: Wound Location: Left, Distal Toe Fourth Wound Open Wounding Event: Pressure Injury Status: Date Acquired: 10/31/2020 Notes: stated toenail was removed by podiatry doctor on Weeks Of Treatment:  11 11/08/20 Clustered Wound: No Comorbid Asthma, Pneumothorax, Arrhythmia, Hypertension, Type History: II Diabetes, Gout, Osteoarthritis, Neuropathy Photos Wound Measurements Length: (cm) 0.2 Width: (cm) 0.2 Depth: (cm) 0.2 Area: (cm) 0.031 Volume: (cm) 0.006 % Reduction in Area: 96.1% % Reduction in Volume: 92.4% Tunneling: No Undermining: No Wound Description Classification: Grade 3 Wound Margin: Flat and Intact Exudate Amount: Medium Exudate Type: Serosanguineous Exudate Color: red, brown Foul Odor After Cleansing: No Slough/Fibrino Yes Wound Bed Granulation Amount: Large (67-100%) Exposed Structure Granulation Quality: Red, Pink Fascia Exposed: No Necrotic Amount: Small (1-33%) Fat Layer (Subcutaneous Tissue) Exposed: Yes Necrotic Quality: Adherent Slough Tendon Exposed: No Muscle Exposed: No Joint Exposed: No Bone Exposed: Yes Treatment Notes Wound #16 (Toe Fourth) Wound Laterality: Left, Distal Cleanser Soap and Water Discharge Instruction: Gently cleanse wound with antibacterial soap, rinse and pat dry prior to dressing wounds [name], Dawn B. ([id number]) Wound Cleanser Discharge Instruction: Wash your hands with soap and water. Remove old dressing, discard into plastic bag and place into trash. Cleanse the wound with Wound Cleanser prior to applying a clean dressing using gauze sponges, not tissues or cotton balls. Do not scrub or use excessive force. Pat dry using gauze sponges, not tissue or cotton balls. Peri-Wound Care Topical Primary Dressing Prisma 4.34 (in) Discharge Instruction: Moisten w/normal saline or sterile water; Cover wound as directed. Do not remove from wound bed. Secondary Dressing Secured With Compression Wrap Compression Stockings Add-Ons Coverlet Latex-Free Fabric Adhesive Dressings Discharge Instruction: Catering manager Signature(s) Signed: 02/20/2021 3:46:30 PM By: Donnamarie Poag Entered By: Donnamarie Poag on  02/20/2021 11:42:38 [name], [name] ([id number]) -------------------------------------------------------------------------------- Wound Assessment Details Patient Name: [name] Date of Service: 02/20/2021 11:15 AM Medical Record Number: [id number] Patient Account Number: [id number] Date of Birth/Sex: [birth year] (70 y.o. F) Treating RN: Donnamarie Poag Primary Care Damarko Stitely: [name], Olivia Mackie Other Clinician: Referring Karleigh Bunte: [name], Olivia Mackie Treating Blasa Raisch/Extender: Skipper Cliche in Treatment: 33 Wound Status Wound Number: 17 Primary Venous Leg Ulcer Etiology: Wound Location: Left  Lower Leg Wound Healed - Epithelialized Wounding Event: Blister Status: Date Acquired: 12/13/2020 Comorbid Asthma, Pneumothorax, Arrhythmia, Hypertension, Type Weeks Of Treatment: 9 History: II Diabetes, Gout, Osteoarthritis, Neuropathy Clustered Wound: No Photos Wound Measurements Length: (cm) 0 Width: (cm) 0 Depth: (cm) 0 Area: (cm) 0 Volume: (cm) 0 % Reduction in Area: 100% % Reduction in Volume: 100% Tunneling: No Undermining: No Wound Description Classification: Full Thickness Without Exposed Support Structure Exudate Amount: None Present s Foul Odor After Cleansing: No Slough/Fibrino No Wound Bed Granulation Amount: None Present (0%) Necrotic Amount: None Present (0%) Electronic Signature(s) Signed: 02/20/2021 3:46:30 PM By: Donnamarie Poag Entered By: Donnamarie Poag on 02/20/2021 11:40:33 [name], [name] ([id number]) -------------------------------------------------------------------------------- Vitals Details Patient Name: [name] Date of Service: 02/20/2021 11:15 AM Medical Record Number: [id number] Patient Account Number: [id number] Date of Birth/Sex: 01/08/1951 (70 y.o. F) Treating RN: Donnamarie Poag Primary Care Treshon Stannard: [name], Olivia Mackie Other Clinician: Referring Yaniyah Koors: [name], Olivia Mackie Treating Netanel Yannuzzi/Extender: Skipper Cliche in Treatment: 33 Vital Signs Time Taken: 11:26 Temperature (F): 97.8 Height (in): 65.5 Pulse (bpm): 86 Weight (lbs): 240 Respiratory Rate (breaths/min): 18 Body Mass Index (BMI): 39.3 Blood Pressure (mmHg): 155/77 Reference Range: 80 - 120 mg / dl Electronic Signature(s) Signed: 02/20/2021 3:46:30 PM By: Donnamarie Poag Entered ByDonnamarie Poag on 02/20/2021 11:27:46

## 2021-02-20 NOTE — Patient Instructions (Signed)
Keep scheduled appt 02/25/2021

## 2021-02-21 ENCOUNTER — Telehealth: Payer: Self-pay | Admitting: Internal Medicine

## 2021-02-21 ENCOUNTER — Ambulatory Visit: Payer: Medicare HMO | Admitting: Internal Medicine

## 2021-02-21 NOTE — Telephone Encounter (Signed)
LM with Judson Roch to call back for verbal.

## 2021-02-21 NOTE — Telephone Encounter (Signed)
Sarah from [facility] home health called in regards to pt. Pt was unable to make last appt so they rescheduled her appt and now they are just needing a verbal approval for one more appt. Judson Roch reports the pt is continuously meeting at her baseline. She and pt are supposed to be meeting today if she can get the approval.  Sarah: [phone]

## 2021-02-24 ENCOUNTER — Other Ambulatory Visit: Payer: Self-pay

## 2021-02-24 ENCOUNTER — Encounter: Payer: Medicare HMO | Admitting: Physician Assistant

## 2021-02-24 ENCOUNTER — Encounter: Payer: Self-pay | Admitting: Internal Medicine

## 2021-02-24 ENCOUNTER — Telehealth: Payer: Self-pay | Admitting: Internal Medicine

## 2021-02-24 DIAGNOSIS — L97822 Non-pressure chronic ulcer of other part of left lower leg with fat layer exposed: Secondary | ICD-10-CM | POA: Diagnosis not present

## 2021-02-24 DIAGNOSIS — L97322 Non-pressure chronic ulcer of left ankle with fat layer exposed: Secondary | ICD-10-CM | POA: Diagnosis not present

## 2021-02-24 DIAGNOSIS — E11621 Type 2 diabetes mellitus with foot ulcer: Secondary | ICD-10-CM | POA: Diagnosis not present

## 2021-02-24 DIAGNOSIS — E1151 Type 2 diabetes mellitus with diabetic peripheral angiopathy without gangrene: Secondary | ICD-10-CM | POA: Diagnosis not present

## 2021-02-24 DIAGNOSIS — I872 Venous insufficiency (chronic) (peripheral): Secondary | ICD-10-CM | POA: Diagnosis not present

## 2021-02-24 DIAGNOSIS — L97812 Non-pressure chronic ulcer of other part of right lower leg with fat layer exposed: Secondary | ICD-10-CM | POA: Diagnosis not present

## 2021-02-24 DIAGNOSIS — L97522 Non-pressure chronic ulcer of other part of left foot with fat layer exposed: Secondary | ICD-10-CM | POA: Diagnosis not present

## 2021-02-24 DIAGNOSIS — E1140 Type 2 diabetes mellitus with diabetic neuropathy, unspecified: Secondary | ICD-10-CM | POA: Diagnosis not present

## 2021-02-24 NOTE — Progress Notes (Signed)
[name] ([id number]) Visit Report for 02/24/2021 Arrival Information Details Patient Name: [name] Date of Service: 02/24/2021 3:15 PM Medical Record Number: [id number] Patient Account Number: [id number] Date of Birth/Sex: [birth year] (70 y.o. F) Treating RN: Donnamarie Poag Primary Care Stone Spirito: [name], Olivia Mackie Other Clinician: Referring Rilyn Scroggs: [name], Olivia Mackie Treating Taquilla Downum/Extender: Skipper Cliche in Treatment: 55 Visit Information History Since Last Visit Added or deleted any medications: No Patient Arrived: Wheel Chair Had a fall or experienced change in No Arrival Time: 15:44 activities of daily living that may affect Accompanied By: self risk of falls: Transfer Assistance: None Hospitalized since last visit: No Patient Identification Verified: Yes Has Dressing in Place as Prescribed: Yes Secondary Verification Process Completed: Yes Pain Present Now: Yes Patient Requires Transmission-Based No Precautions: Patient Has Alerts: Yes Patient Alerts: Patient on Blood Thinner Eliquis Diabetic II 06/2020 ABI L)1.22 R) 1.14 Electronic Signature(s) Signed: 02/24/2021 4:08:07 PM By: Donnamarie Poag Entered By: Donnamarie Poag on 02/24/2021 15:45:01 Lynam, [name] ([id number]) -------------------------------------------------------------------------------- Encounter Discharge Information Details Patient Name: [name] Date of Service: 02/24/2021 3:15 PM Medical Record Number: [id number] Patient Account Number: [id number] Date of Birth/Sex: 13-Oct-1950 (70 y.o. F) Treating RN: Donnamarie Poag Primary Care Starsha Morning: [name], Olivia Mackie Other Clinician: Referring Romond Pipkins: [name], Olivia Mackie Treating Herrick Hartog/Extender: Skipper Cliche in Treatment: 91 Encounter Discharge Information Items Post Procedure Vitals Discharge Condition: Stable Temperature (F): 98.3 Ambulatory Status: Wheelchair Pulse (bpm): 91 Discharge Destination:  Home Respiratory Rate (breaths/min): 16 Transportation: Private Auto Blood Pressure (mmHg): 182/75 Accompanied By: self Schedule Follow-up Appointment: Yes Clinical Summary of Care: Electronic Signature(s) Signed: 02/24/2021 4:08:07 PM By: Donnamarie Poag Entered By: Donnamarie Poag on 02/24/2021 16:07:25 [name] ([id number]) -------------------------------------------------------------------------------- Lower Extremity Assessment Details Patient Name: [name] Date of Service: 02/24/2021 3:15 PM Medical Record Number: [id number] Patient Account Number: [id number] Date of Birth/Sex: 1950-09-18 (70 y.o. F) Treating RN: Donnamarie Poag Primary Care Thor Nannini: [name], Olivia Mackie Other Clinician: Referring Illene Sweeting: [name], Olivia Mackie Treating Lexus Shampine/Extender: Skipper Cliche in Treatment: 34 Edema Assessment Assessed: [Left: Yes] [Right: Yes] Edema: [Left: No] [Right: No] Calf Left: Right: Point of Measurement: 31 cm From Medial Instep 30 cm 32 cm Ankle Left: Right: Point of Measurement: 11 cm From Medial Instep 17 cm 17.5 cm Vascular Assessment Pulses: Dorsalis Pedis Palpable: [Left:Yes] [Right:Yes] Electronic Signature(s) Signed: 02/24/2021 4:08:07 PM By: Donnamarie Poag Entered By: Donnamarie Poag on 02/24/2021 15:55:26 Robenson, [name] ([id number]) -------------------------------------------------------------------------------- Multi Wound Chart Details Patient Name: [name] Date of Service: 02/24/2021 3:15 PM Medical Record Number: [id number] Patient Account Number: [id number] Date of Birth/Sex: Mar 14, 1951 (70 y.o. 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 Comorbid History: Asthma, Pneumothorax, N/A N/A Arrhythmia, Hypertension, Type II Diabetes, Gout, Osteoarthritis, Neuropathy Date Acquired: 10/31/2020 N/A N/A Weeks of Treatment: 12 N/A N/A Wound Status: Open N/A N/A Measurements L x W x D (cm) 0.2x0.2x0.2 N/A N/A Area (cm) : 0.031 N/A N/A Volume (cm) : 0.006 N/A N/A % Reduction in Area: 96.10% N/A N/A % Reduction in Volume: 92.40% N/A N/A Classification: Grade 3 N/A N/A Exudate Amount: Medium N/A N/A Exudate Type: Serosanguineous N/A N/A Exudate Color: red, brown  N/A N/A Wound Margin: Thickened N/A N/A Granulation Amount: Large (67-100%) N/A N/A Granulation Quality: Red, Pink N/A N/A Necrotic Amount: Small (1-33%) N/A N/A Exposed Structures: Fat Layer (Subcutaneous Tissue): N/A N/A Yes Bone: Yes Fascia: No Tendon: No Muscle: No Joint: No Treatment Notes Electronic Signature(s) Signed: 02/24/2021 4:08:07 PM By: Donnamarie Poag Entered By: Donnamarie Poag on 02/24/2021 15:55:50 Richer, [name] ([id number]) -------------------------------------------------------------------------------- [place] Details Patient Name: [name] Date of Service: 02/24/2021 3:15 PM Medical Record Number: [id number] Patient Account Number: [id number] Date of Birth/Sex: Sep 18, 1950 (70 y.o. F) Treating RN: Donnamarie Poag Primary Care Eleanor Gatliff: [name], Olivia Mackie Other Clinician: Referring Chicquita Mendel: [name], Olivia Mackie Treating Fallou Hulbert/Extender: Skipper Cliche in Treatment: 57 Active Inactive Electronic Signature(s) Signed: 02/24/2021 4:08:07 PM By: Donnamarie Poag Entered By: Donnamarie Poag on 02/24/2021 15:55:36 Degrasse, [name]  ([id number]) -------------------------------------------------------------------------------- Pain Assessment Details Patient Name: [name] Date of Service: 02/24/2021 3:15 PM Medical Record Number: [id number] Patient Account Number: [id number] Date of Birth/Sex: 09/02/1950 (70 y.o. F) Treating RN: Donnamarie Poag Primary Care Skyleigh Windle: [name], Olivia Mackie Other Clinician: Referring Abubakr Wieman: [name], Olivia Mackie Treating Carmen Vallecillo/Extender: Skipper Cliche in Treatment: 44 Active Problems Location of Pain Severity and Description of Pain Patient Has Paino Yes Site Locations Rate the pain. Current Pain Level: 5 Pain Management and Medication Current Pain Management: Notes left shoulder pain Electronic Signature(s) Signed: 02/24/2021 4:08:07 PM By: Donnamarie Poag Entered By: Donnamarie Poag on 02/24/2021 15:49:41 Jaso, [name] ([id number]) -------------------------------------------------------------------------------- Patient/Caregiver Education Details Patient Name: [name] Date of Service: 02/24/2021 3:15 PM Medical Record Number: [id number] Patient Account Number: [id number] Date of Birth/Gender: 1951-02-24 (70 y.o. F) Treating RN: Donnamarie Poag Primary Care Physician: [name], Olivia Mackie Other Clinician: Referring Physician: [name], Olivia Mackie Treating Physician/Extender: Skipper Cliche in Treatment: 7 Education Assessment Education Provided To: Patient Education Topics Provided Basic Hygiene: Infection: Nutrition: Pressure: Wound Debridement: Wound/Skin Impairment: Electronic Signature(s) Signed: 02/24/2021 4:08:07 PM By: Donnamarie Poag Entered By: Donnamarie Poag on 02/24/2021 Daykin, [name] ([id number]) -------------------------------------------------------------------------------- Wound Assessment Details Patient Name: [name] Date of Service: 02/24/2021 3:15 PM Medical Record Number: [id number] Patient Account Number:  [id number] Date of Birth/Sex: 02-Aug-1950 (70 y.o. F) Treating RN: Donnamarie Poag Primary Care Merlin Golden: [name], Olivia Mackie Other Clinician: Referring [place] Merida: [name], Olivia Mackie Treating Drayke Grabel/Extender: Skipper Cliche in Treatment: 34 Wound Status Wound Number: 16 Primary Diabetic Wound/Ulcer of the Lower Extremity Etiology: Wound Location: Left, Distal Toe Fourth Wound Open Wounding Event: Pressure Injury Status: Date Acquired: 10/31/2020 Notes: stated toenail was removed by podiatry doctor on Weeks Of Treatment: 12 11/08/20 Clustered Wound: No Comorbid Asthma, Pneumothorax, Arrhythmia, Hypertension, Type History: II Diabetes, Gout, Osteoarthritis, Neuropathy Photos Wound Measurements Length: (cm) 0.2 Width: (cm) 0.2 Depth: (cm) 0.2 Area: (cm) 0.031 Volume: (cm) 0.006 % Reduction in Area: 96.1% % Reduction in Volume: 92.4% Tunneling: No Undermining: No Wound Description Classification: Grade 3 Wound Margin: Thickened Exudate Amount: Medium Exudate Type: Serosanguineous Exudate Color: red, brown Foul Odor After Cleansing: No Slough/Fibrino Yes Wound Bed Granulation Amount: Large (67-100%) Exposed Structure Granulation Quality: Red, Pink Fascia Exposed: No Necrotic Amount: Small (1-33%) Fat Layer (Subcutaneous Tissue) Exposed: Yes Necrotic Quality: Adherent Slough Tendon Exposed: No Muscle Exposed: No Joint Exposed: No Bone Exposed: Yes Treatment Notes Wound #16 (Toe Fourth) Wound Laterality: Left, Distal Cleanser Soap and Water Discharge Instruction: Gently cleanse wound with antibacterial soap, rinse and pat dry prior to dressing wounds Otero, Israella B. ([id number]) Wound Cleanser Discharge Instruction: Wash your hands with soap and water. Remove old dressing, discard  into plastic bag and place into trash. Cleanse the wound with Wound Cleanser prior to applying a clean dressing using gauze sponges, not tissues or cotton balls. Do not scrub or use  excessive force. Pat dry using gauze sponges, not tissue or cotton balls. Peri-Wound Care Topical Primary Dressing Xeroform-HBD 2x2 (in/in) Discharge Instruction: Apply Xeroform-HBD 2x2 (in/in) as directed Secondary Dressing Secured With Compression Wrap Compression Stockings Add-Ons Coverlet Latex-Free Fabric Adhesive Dressings Discharge Instruction: Elastic Knuckle Bandage Electronic Signature(s) Signed: 02/24/2021 4:08:07 PM By: Donnamarie Poag Entered By: Donnamarie Poag on 02/24/2021 15:53:34 Rochon, [name] ([id number]) -------------------------------------------------------------------------------- Vitals Details Patient Name: [name] Date of Service: 02/24/2021 3:15 PM Medical Record Number: [id number] Patient Account Number: [id number] Date of Birth/Sex: 1950/09/02 (70 y.o. F) Treating RN: Donnamarie Poag Primary Care Mairim Bade: [name], Olivia Mackie Other Clinician: Referring Jazzmyne Rasnick: [name], Olivia Mackie Treating Nekesha Font/Extender: Skipper Cliche in Treatment: 34 Vital Signs Time Taken: 15:46 Temperature (F): 98.3 Height (in): 65.5 Pulse (bpm): 91 Weight (lbs): 240 Respiratory Rate (breaths/min): 16 Body Mass Index (BMI): 39.3 Blood Pressure (mmHg): 182/75 Reference Range: 80 - 120 mg / dl Electronic Signature(s) Signed: 02/24/2021 4:08:07 PM By: Donnamarie Poag Entered ByDonnamarie Poag on 02/24/2021 15:49:26

## 2021-02-24 NOTE — Progress Notes (Addendum)
TORRE, PIKUS ([id number]) Visit Report for 02/24/2021 Chief Complaint Document Details Patient Name: [name], [name] Date of Service: 02/24/2021 3:15 PM Medical Record Number: [id number] Patient Account Number: [id number] Date of Birth/Sex: [birth year] (70 y.o. F) Treating RN: Donnamarie Poag Primary Care Provider: [name], Olivia Mackie Other Clinician: Referring Provider: [name], Olivia Mackie Treating Provider/Extender: Skipper Cliche in Treatment: 81 Information Obtained from: Patient Chief Complaint Right Leg Ulcer and Right 4thToe Ulcer Electronic Signature(s) Signed: 02/24/2021 3:59:13 PM By: Worthy Keeler PA-C Entered By: Worthy Keeler on 02/24/2021 Sheffield, [name] ([id number]) -------------------------------------------------------------------------------- Debridement Details Patient Name: [name] Date of Service: 02/24/2021 3:15 PM Medical Record Number: [id number] Patient Account Number: [id number] Date of Birth/Sex: 24-Mar-1950 (70 y.o. F) Treating RN: Donnamarie Poag Primary Care Provider: [name], Olivia Mackie Other Clinician: Referring Provider: [name], Olivia Mackie Treating Provider/Extender: Skipper Cliche in Treatment: 34 Debridement Performed for Wound #16 Left,Distal Toe Fourth Assessment: Performed By: Physician Tommie Sams., PA-C Debridement Type: Debridement Severity of Tissue Pre Debridement: Fat layer exposed Level of Consciousness (Pre- Awake and Alert procedure): Pre-procedure Verification/Time Out Yes - 16:00 Taken: Start Time: 16:01 Pain Control: Lidocaine Total Area Debrided (L x W): 0.2 (cm) x 0.2 (cm) = 0.04 (cm) Tissue and other material Viable, Non-Viable, Callus, Skin: Dermis debrided: Level: Skin/Dermis Debridement Description: Selective/Open Wound Instrument: Curette Bleeding: Minimum Hemostasis Achieved: Pressure Response to Treatment: Procedure was tolerated well Level of Consciousness (Post- Awake  and Alert procedure): Post Debridement Measurements of Total Wound Length: (cm) 0.2 Width: (cm) 0.2 Depth: (cm) 0.2 Volume: (cm) 0.006 Character of Wound/Ulcer Post Debridement: Improved Severity of Tissue Post Debridement: Fat layer exposed Post Procedure Diagnosis Same as Pre-procedure Electronic Signature(s) Signed: 02/24/2021 4:08:07 PM By: Donnamarie Poag Signed: 02/25/2021 9:26:06 AM By: Worthy Keeler PA-C Entered By: Donnamarie Poag on 02/24/2021 16:02:56 Goller, [name] ([id number]) -------------------------------------------------------------------------------- HPI Details Patient Name: [name] Date of Service: 02/24/2021 3:15 PM Medical Record Number: [id number] Patient Account Number: [id number] Date of Birth/Sex: [birth year] (70 y.o. F) Treating RN: Donnamarie Poag Primary Care Provider: [name], Olivia Mackie Other Clinician: Referring Provider: [name], Olivia Mackie Treating Provider/Extender: Skipper Cliche in Treatment: 34 History of Present Illness HPI Description: 06/28/20 upon evaluation today patient appears to be doing unfortunately somewhat poorly in regard to wounds that she has over her lower extremities. She has a wound on the left distal second toe, left posterior heel, and right anterior lower leg. With that being said all in all none of the wounds appear to be too bad the one that hurts her the most is actually the wound on the posterior heel which actually is also one of the smallest. Nonetheless I think there is some callus hiding what is really going on in this area. Fortunately there does not appear to be any obvious evidence of infection which is great news. Patient does have a history of diabetes mellitus type 2, chronic venous insufficiency, hypertension, history of DVTs, and is on long-term anticoagulant therapy, Eliquis. 07/05/20 upon evaluation today patient appears to be doing well currently with regard to her wounds. I feel like she is making  progress which is great news and overall there is no signs of active infection at this time. No fevers, chills, nausea, vomiting, or diarrhea. 4/29; patient has a only a small area remaining on the tip of her left second toe and a small area remaining on the left heel. The area on the right anterior lower leg is healed. They tell me that she had  remote podiatric surgery on the toes of the left foot however they are very fixed in position and I wonder whether they are making it difficult to relieve pressure in her foot wear. She is a type II diabetic with neuropathy as well. 08/01/2020 upon evaluation today patient appears to be doing well currently in regard to her wounds. In fact everything appears to be doing much better. The posterior aspect of her heel is actually giving her some trouble not the Achilles area but a small wound that we did not even have marked. In fact I had noted this before. Nonetheless we are going to addressed that today. 5/27; most of the patient's wounds are healed including the left Achilles heel left toe. Right forearm is also healed. She has a new abrasion posteriorly in the right elbow area just above the olecranon this happened today with an abrasion from her car door 6/15; the patient's wounds on the left Achilles and left second toe remain healed. The area on the right forearm is also just about healed in fact the skin I replaced last time is looking viable which is good. She had me look at the left heel again which is not in the area of the wound more towards the tip of the heel at the Achilles insertion site as well as the tip of the left foot second toe. In the Achilles area that is clearly is friction probably in bed at night. Not really near where the wound was. The left second toe remains healed although this is hammered and overhanging first toe also puts pressure in this area 09/13/2020 upon evaluation today patient appears to be doing well with regard to all  previous wounds that she had. Everything is completely healed. With that being said I do think that the patient unfortunately has a new skin tear on the right forearm which is due to her put a Band-Aid on it and then when it pulled off this caused some issues with skin tearing. Nonetheless I think that this needs to be addressed today. Hopefully will take too long to get this to heal. She is having some issues with pain in her heel but I think this is more related to be honest to her neuropathy as opposed to anything else. Readmission: 10/30/2020 upon evaluation today patient appears to be doing poorly in regard to her legs bilaterally. She is also having an issue with her fourth toe on the left. Fortunately there does not appear to be any signs of active infection at this time. No fevers, chills, nausea, vomiting, or diarrhea. It is actually been quite a while since we have seen her. In fact June 24 was the last time that she was here in the clinic. She tells me she did not come back because things were just doing "pretty well". 11/29/2020 upon evaluation today patient actually appears to be doing well with regard to her arms those are completely healed. Her right leg is still open but doing okay. The fourth toe of the left foot does show some signs here of having some issues with necrotic tissue of an open wound. This is where a toenail was removed by podiatry. Subsequently I do think that we need to go ahead and see what we can do about getting this cleared away so being try to get some healing going forward. 12/13/2020 upon evaluation today patient appears to be doing decently well in regard to her wound on the right leg. In general I think  that she is making good progress here. Unfortunately she continues to have significant issues with swelling in the left leg is now even more swollen at this point. For that reason I do believe she would be benefited by wrapping both sides although she is not  interested in doing this. Overall I think that she is definitely making some good progress here nonetheless in regard to the right leg left leg leave some to be desired. 12/27/2020 upon evaluation today patient appears to be doing well with regard to her wound in regard to the legs. Unfortunately the toe was not doing nearly as well. I am much more concerned about infection and osteomyelitis here. She had the MRI but right now were not seeing obvious evidence of redness spreading up to her foot this is good news I do not have the result of the MRI as of yet. I am good to place her on doxycycline however due to the fact that the wound does seem to be getting a little worse in regard to her toe. 01/14/2021 upon evaluation today patient appears to be doing well with regard to her ulcers. The right leg is doing well and even the toe seems to be doing well though she still has some evidence of bone exposed on the toe which does not appear to be doing quite as good as I would like to see just and the fact that is still not covered over new tissue but looks tremendously better compared to 2 weeks ago. Overall very pleased in that regard. 01/28/2021 upon evaluation today patient's leg on the left actually appears to have new wounds this is due to her deciding to shave yesterday. Fortunately there does not appear to be any signs of infection currently. This is good news. Nonetheless I do believe that with these new areas open it would be best to wrap her leg to try to make sure we get this under control sooner rather than later. With regard to her toe there still does appear to be some evidence of bone exposed this was covered over with callus I did remove that today. Nonetheless I think this is significantly smaller than previous. 02/11/2021 upon evaluation today patient appears to be doing okay in regard to her leg ulcers. I do not see anything obvious at this point this seems to be a complicating factor. I  think that she is getting better. Were wrap of the right leg not the left leg. With that being said the left fourth Plancarte, Sharell B. ([id number]) toe ulcer still does have evidence of being open there is some drainage still on top of the wound I think we can to try to see about using something like a collagen dressing today to see if that would be of benefit for her. She is in agreement with giving this a trial. Nonetheless I am concerned about the fact that she continues to have issues here with this not healing it is confirmed chronic osteomyelitis she has been on antibiotics. I am going to extend those today as well. That is Keflex and that will be for 1 additional month. Nonetheless if she is not doing significantly better by the next time I see her I think that the biggest thing is going to be for [place] to go ahead and see what we can do about getting her started with hyperbaric oxygen therapy to try to save the toe. 02/20/2021 upon evaluation today patient presents for follow-up she actually came in a little  bit early as the home health nurse was concerned about her toe becoming "gangrenous". With that being said the good news is there is [place] no evidence of this and in fact the toe seems to be doing better at this point. What was over the toe was an area that had bled significantly and then subsequently dried making it look much worse than where things actually stand. There is just a very small opening remaining in the toe. In regard to her legs everything appears to be healed bilaterally. 02/24/2021 upon evaluation today patient appears to be doing well with regard to her toe ulcer. I am actually very pleased with where we stand and I think that she is making good progress at this time. There is still a very small opening although I think the biggest issue is the collagen seems to be getting stuck and drying up because the wound is so small we probably just need to use something to keep this  moist like a little piece of Xeroform gauze and see if we get this to close. Electronic Signature(s) Signed: 02/25/2021 8:11:02 AM By: Worthy Keeler PA-C Entered By: Worthy Keeler on 02/25/2021 08:11:02 TRAMAINE, SNELL ([id number]) -------------------------------------------------------------------------------- Physical Exam Details Patient Name: [name] Date of Service: 02/24/2021 3:15 PM Medical Record Number: [id number] Patient Account Number: [id number] Date of Birth/Sex: 18-Jan-1951 (70 y.o. F) Treating RN: Donnamarie Poag Primary Care Provider: [name], Olivia Mackie Other Clinician: Referring Provider: [name], Olivia Mackie Treating Provider/Extender: Skipper Cliche in Treatment: 78 Constitutional Well-nourished and well-hydrated in no acute distress. Respiratory normal breathing without difficulty. Psychiatric this patient is able to make decisions and demonstrates good insight into disease process. Alert and Oriented x 3. pleasant and cooperative. Notes Upon inspection patient's wound bed showed signs of good granulation epithelization at this point. Fortunately I do not see any evidence of active infection at this time which is great news. I think that her toe is doing quite well. Electronic Signature(s) Signed: 02/25/2021 8:11:16 AM By: Worthy Keeler PA-C Entered By: Worthy Keeler on 02/25/2021 08:11:16 LETITA, PRENTISS ([id number]) -------------------------------------------------------------------------------- Physician Orders Details Patient Name: [name] Date of Service: 02/24/2021 3:15 PM Medical Record Number: [id number] Patient Account Number: [id number] Date of Birth/Sex: May 31, 1950 (70 y.o. F) Treating RN: Donnamarie Poag Primary Care Provider: [name], Olivia Mackie Other Clinician: Referring Provider: [name], Olivia Mackie Treating Provider/Extender: Skipper Cliche in Treatment: 54 Verbal / Phone Orders: No Diagnosis Coding ICD-10  Coding Code Description E11.621 Type 2 diabetes mellitus with foot ulcer M86.572 Other chronic hematogenous osteomyelitis, left ankle and foot L97.522 Non-pressure chronic ulcer of other part of left foot with fat layer exposed I87.2 Venous insufficiency (chronic) (peripheral) L97.812 Non-pressure chronic ulcer of other part of right lower leg with fat layer exposed L97.822 Non-pressure chronic ulcer of other part of left lower leg with fat layer exposed Z79.01 Long term (current) use of anticoagulants Follow-up Appointments o Return Appointment in 1 week. o Nurse Visit as needed [place] for wound care. May utilize formulary equivalent dressing for wound treatment orders unless otherwise specified. Home Health Nurse may visit [facility] to address patientos wound care needs. o Scheduled days for dressing changes to be completed; exception, patient has scheduled wound care visit that day. o **Please direct any NON-WOUND related issues/requests for orders to patient's Primary Care Physician. **If current dressing causes regression in wound condition, may D/C ordered dressing product/s and apply Normal Saline Moist  Dressing daily until next [place] or Other MD appointment. **Notify [facility] of regression in wound condition at [phone]. Bathing/ Shower/ Hygiene o May shower with wound dressing protected with water repellent cover or cast protector. o No tub bath. o Other: - Be careful of new skin to protect-do not shave Anesthetic (Use 'Patient Medications' Section for Anesthetic Order Entry) o Lidocaine applied to wound bed Edema Control - Lymphedema / Segmental Compressive Device / Other o Patient to wear own compression stockings. Remove compression stockings every night before going to bed and put on every morning when getting up. o Elevate, Exercise Daily and Avoid Standing for  Long Periods of Time. o Elevate legs to the level of the heart and pump ankles as often as possible o Elevate leg(s) parallel to the floor when sitting. o DO YOUR BEST to sleep in the bed at night. DO NOT sleep in your recliner. Long hours of sitting in a recliner leads to swelling of the legs and/or potential wounds on your backside. Off-Loading Wound #16 Left,Distal Toe Fourth o Open toe surgical shoe - keep pressure off wound/toes o Other: - suggest float heels when in bed with a pillow to keep pressure off of heels; choose shoewear for right foot to not rub foot Additional Orders / Instructions o Follow Nutritious Diet and Increase Protein Intake Medications-Please add to medication list. o P.O. Antibiotics - Continue as directed Wound Treatment Wound #16 - Toe Fourth Wound Laterality: Left, Distal Broaden, Catharina B. ([id number]) Cleanser: Soap and Water (Home Health) (Generic) 3 x Per Week/15 Days Discharge Instructions: Gently cleanse wound with antibacterial soap, rinse and pat dry prior to dressing wounds Cleanser: Wound Cleanser (Home Health) (Generic) 3 x Per Week/15 Days Discharge Instructions: Wash your hands with soap and water. Remove old dressing, discard into plastic bag and place into trash. Cleanse the wound with Wound Cleanser prior to applying a clean dressing using gauze sponges, not tissues or cotton balls. Do not scrub or use excessive force. Pat dry using gauze sponges, not tissue or cotton balls. Primary Dressing: Xeroform-HBD 2x2 (in/in) 3 x Per Week/15 Days Discharge Instructions: Apply Xeroform-HBD 2x2 (in/in) as directed Add-Ons: Coverlet Latex-Free Fabric Adhesive Dressings (Home Health) (Generic) 3 x Per Week/15 Days Discharge Instructions: Elastic Knuckle Bandage Electronic Signature(s) Signed: 02/24/2021 4:08:07 PM By: Donnamarie Poag Signed: 02/25/2021 9:26:06 AM By: Worthy Keeler PA-C Entered By: Donnamarie Poag on 02/24/2021 16:06:31 Lovecchio,  [name] ([id number]) -------------------------------------------------------------------------------- Problem List Details Patient Name: [name] Date of Service: 02/24/2021 3:15 PM Medical Record Number: [id number] Patient Account Number: [id number] Date of Birth/Sex: 30-Sep-1950 (70 y.o. F) Treating RN: Donnamarie Poag Primary Care Provider: [name], Olivia Mackie Other Clinician: Referring Provider: [name], Olivia Mackie Treating Provider/Extender: Skipper Cliche in Treatment: 59 Active Problems ICD-10 Encounter Code Description Active Date MDM Diagnosis E11.621 Type 2 diabetes mellitus with foot ulcer 06/28/2020 No Yes M86.572 Other chronic hematogenous osteomyelitis, left ankle and foot 02/11/2021 No Yes L97.522 Non-pressure chronic ulcer of other part of left foot with fat layer 06/28/2020 No Yes exposed I87.2 Venous insufficiency (chronic) (peripheral) 06/28/2020 No Yes L97.812 Non-pressure chronic ulcer of other part of right lower leg with fat layer 06/28/2020 No Yes exposed L97.822 Non-pressure chronic ulcer of other part of left lower leg with fat layer 02/11/2021 No Yes exposed Z79.01 Long term (current) use of anticoagulants 06/28/2020 No Yes Inactive Problems ICD-10 Code Description Active Date Inactive Date I10 Essential (primary) hypertension 06/28/2020 06/28/2020 Z86.718 Personal history of other  venous thrombosis and embolism 06/28/2020 06/28/2020 Resolved Problems ICD-10 Code Description Active Date Resolved Date L97.322 Non-pressure chronic ulcer of left ankle with fat layer exposed 06/28/2020 06/28/2020 S50.811A Abrasion of right forearm, initial encounter 10/30/2020 10/30/2020 LAMEES, GABLE ([id number]) S50.311D Abrasion of right elbow, subsequent encounter 08/16/2020 08/16/2020 Electronic Signature(s) Signed: 02/24/2021 3:59:04 PM By: Worthy Keeler PA-C Entered By: Worthy Keeler on 02/24/2021 15:59:04 SHELBIA, SCINTO  ([id number]) -------------------------------------------------------------------------------- Progress Note Details Patient Name: [name] Date of Service: 02/24/2021 3:15 PM Medical Record Number: [id number] Patient Account Number: [id number] Date of Birth/Sex: 1951/03/11 (70 y.o. F) Treating RN: Donnamarie Poag Primary Care Provider: [name], Olivia Mackie Other Clinician: Referring Provider: [name], Olivia Mackie Treating Provider/Extender: Skipper Cliche in Treatment: 47 Subjective Chief Complaint Information obtained from Patient Right Leg Ulcer and Right 4thToe Ulcer History of Present Illness (HPI) 06/28/20 upon evaluation today patient appears to be doing unfortunately somewhat poorly in regard to wounds that she has over her lower extremities. She has a wound on the left distal second toe, left posterior heel, and right anterior lower leg. With that being said all in all none of the wounds appear to be too bad the one that hurts her the most is actually the wound on the posterior heel which actually is also one of the smallest. Nonetheless I think there is some callus hiding what is really going on in this area. Fortunately there does not appear to be any obvious evidence of infection which is great news. Patient does have a history of diabetes mellitus type 2, chronic venous insufficiency, hypertension, history of DVTs, and is on long-term anticoagulant therapy, Eliquis. 07/05/20 upon evaluation today patient appears to be doing well currently with regard to her wounds. I feel like she is making progress which is great news and overall there is no signs of active infection at this time. No fevers, chills, nausea, vomiting, or diarrhea. 4/29; patient has a only a small area remaining on the tip of her left second toe and a small area remaining on the left heel. The area on the right anterior lower leg is healed. They tell me that she had remote podiatric surgery on the toes of  the left foot however they are very fixed in position and I wonder whether they are making it difficult to relieve pressure in her foot wear. She is a type II diabetic with neuropathy as well. 08/01/2020 upon evaluation today patient appears to be doing well currently in regard to her wounds. In fact everything appears to be doing much better. The posterior aspect of her heel is actually giving her some trouble not the Achilles area but a small wound that we did not even have marked. In fact I had noted this before. Nonetheless we are going to addressed that today. 5/27; most of the patient's wounds are healed including the left Achilles heel left toe. Right forearm is also healed. She has a new abrasion posteriorly in the right elbow area just above the olecranon this happened today with an abrasion from her car door 6/15; the patient's wounds on the left Achilles and left second toe remain healed. The area on the right forearm is also just about healed in fact the skin I replaced last time is looking viable which is good. She had me look at the left heel again which is not in the area of the wound more towards the tip of the heel at the Achilles insertion site as well as the tip  of the left foot second toe. In the Achilles area that is clearly is friction probably in bed at night. Not really near where the wound was. The left second toe remains healed although this is hammered and overhanging first toe also puts pressure in this area 09/13/2020 upon evaluation today patient appears to be doing well with regard to all previous wounds that she had. Everything is completely healed. With that being said I do think that the patient unfortunately has a new skin tear on the right forearm which is due to her put a Band-Aid on it and then when it pulled off this caused some issues with skin tearing. Nonetheless I think that this needs to be addressed today. Hopefully will take too long to get this to heal. She  is having some issues with pain in her heel but I think this is more related to be honest to her neuropathy as opposed to anything else. Readmission: 10/30/2020 upon evaluation today patient appears to be doing poorly in regard to her legs bilaterally. She is also having an issue with her fourth toe on the left. Fortunately there does not appear to be any signs of active infection at this time. No fevers, chills, nausea, vomiting, or diarrhea. It is actually been quite a while since we have seen her. In fact June 24 was the last time that she was here in the clinic. She tells me she did not come back because things were just doing "pretty well". 11/29/2020 upon evaluation today patient actually appears to be doing well with regard to her arms those are completely healed. Her right leg is still open but doing okay. The fourth toe of the left foot does show some signs here of having some issues with necrotic tissue of an open wound. This is where a toenail was removed by podiatry. Subsequently I do think that we need to go ahead and see what we can do about getting this cleared away so being try to get some healing going forward. 12/13/2020 upon evaluation today patient appears to be doing decently well in regard to her wound on the right leg. In general I think that she is making good progress here. Unfortunately she continues to have significant issues with swelling in the left leg is now even more swollen at this point. For that reason I do believe she would be benefited by wrapping both sides although she is not interested in doing this. Overall I think that she is definitely making some good progress here nonetheless in regard to the right leg left leg leave some to be desired. 12/27/2020 upon evaluation today patient appears to be doing well with regard to her wound in regard to the legs. Unfortunately the toe was not doing nearly as well. I am much more concerned about infection and osteomyelitis  here. She had the MRI but right now were not seeing obvious evidence of redness spreading up to her foot this is good news I do not have the result of the MRI as of yet. I am good to place her on doxycycline however due to the fact that the wound does seem to be getting a little worse in regard to her toe. 01/14/2021 upon evaluation today patient appears to be doing well with regard to her ulcers. The right leg is doing well and even the toe seems to be doing well though she still has some evidence of bone exposed on the toe which does not appear to be doing  quite as good as I would like to see just and the fact that is still not covered over new tissue but looks tremendously better compared to 2 weeks ago. Overall very pleased in that regard. 01/28/2021 upon evaluation today patient's leg on the left actually appears to have new wounds this is due to her deciding to shave yesterday. Fortunately there does not appear to be any signs of infection currently. This is good news. Nonetheless I do believe that with these new areas open it would be best to wrap her leg to try to make sure we get this under control sooner rather than later. With regard to her toe there still does Dimauro, Mekiyah B. ([id number]) appear to be some evidence of bone exposed this was covered over with callus I did remove that today. Nonetheless I think this is significantly smaller than previous. 02/11/2021 upon evaluation today patient appears to be doing okay in regard to her leg ulcers. I do not see anything obvious at this point this seems to be a complicating factor. I think that she is getting better. Were wrap of the right leg not the left leg. With that being said the left fourth toe ulcer still does have evidence of being open there is some drainage still on top of the wound I think we can to try to see about using something like a collagen dressing today to see if that would be of benefit for her. She is in agreement with  giving this a trial. Nonetheless I am concerned about the fact that she continues to have issues here with this not healing it is confirmed chronic osteomyelitis she has been on antibiotics. I am going to extend those today as well. That is Keflex and that will be for 1 additional month. Nonetheless if she is not doing significantly better by the next time I see her I think that the biggest thing is going to be for [place] to go ahead and see what we can do about getting her started with hyperbaric oxygen therapy to try to save the toe. 02/20/2021 upon evaluation today patient presents for follow-up she actually came in a little bit early as the home health nurse was concerned about her toe becoming "gangrenous". With that being said the good news is there is [place] no evidence of this and in fact the toe seems to be doing better at this point. What was over the toe was an area that had bled significantly and then subsequently dried making it look much worse than where things actually stand. There is just a very small opening remaining in the toe. In regard to her legs everything appears to be healed bilaterally. 02/24/2021 upon evaluation today patient appears to be doing well with regard to her toe ulcer. I am actually very pleased with where we stand and I think that she is making good progress at this time. There is still a very small opening although I think the biggest issue is the collagen seems to be getting stuck and drying up because the wound is so small we probably just need to use something to keep this moist like a little piece of Xeroform gauze and see if we get this to close. Objective Constitutional Well-nourished and well-hydrated in no acute distress. Vitals Time Taken: 3:46 PM, Height: 65.5 in, Weight: 240 lbs, BMI: 39.3, Temperature: 98.3 F, Pulse: 91 bpm, Respiratory Rate: 16 breaths/min, Blood Pressure: 182/75 mmHg. Respiratory normal breathing without  difficulty. Psychiatric this patient is  able to make decisions and demonstrates good insight into disease process. Alert and Oriented x 3. pleasant and cooperative. General Notes: Upon inspection patient's wound bed showed signs of good granulation epithelization at this point. Fortunately I do not see any evidence of active infection at this time which is great news. I think that her toe is doing quite well. Integumentary (Hair, Skin) Wound #16 status is Open. Original cause of wound was Pressure Injury. The date acquired was: 10/31/2020. The wound has been in treatment 12 weeks. The wound is located on the Left,Distal Toe Fourth. The wound measures 0.2cm length x 0.2cm width x 0.2cm depth; 0.031cm^2 area and 0.006cm^3 volume. There is bone and Fat Layer (Subcutaneous Tissue) exposed. There is no tunneling or undermining noted. There is a medium amount of serosanguineous drainage noted. The wound margin is thickened. There is large (67-100%) red, pink granulation within the wound bed. There is a small (1-33%) amount of necrotic tissue within the wound bed including Adherent Slough. 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 ([id number]) Procedures Wound #16 Pre-procedure diagnosis of Wound #16 is a Diabetic Wound/Ulcer of the Lower Extremity located on the Left,Distal Toe Fourth .Severity of Tissue Pre Debridement is: Fat layer exposed. There was a Selective/Open Wound Skin/Dermis Debridement with a total area of 0.04 sq cm performed by Tommie Sams., PA-C.  With the following instrument(s): Curette to remove Viable and Non-Viable tissue/material. Material removed includes Callus and Skin: Dermis and after achieving pain control using Lidocaine. A time out was conducted at 16:00, prior to the start of the procedure. A Minimum amount of bleeding was controlled with Pressure. The procedure was tolerated well. Post Debridement Measurements: 0.2cm length x 0.2cm width x 0.2cm depth; 0.006cm^3 volume. Character of Wound/Ulcer Post Debridement is improved. Severity of Tissue Post Debridement is: Fat layer exposed. Post procedure Diagnosis Wound #16: Same as Pre-Procedure Plan Follow-up Appointments: Return Appointment in 1 week. Nurse Visit as needed Home Health: [place]: - [place] for wound care. May utilize formulary equivalent dressing for wound treatment orders unless otherwise specified. Home Health Nurse may visit PRN to address patient s wound care needs. Scheduled days for dressing changes to be completed; exception, patient has scheduled wound care visit that day. **Please direct any NON-WOUND related issues/requests for orders to patient's Primary Care Physician. **If current dressing causes regression in wound condition, may D/C ordered dressing product/s and apply Normal Saline Moist Dressing daily until next [place] or Other MD appointment. **Notify [facility] of regression in wound condition at [phone]. Bathing/ Shower/ Hygiene: May shower with wound dressing protected with water repellent cover or cast protector. No tub bath. Other: - Be careful of new skin to protect-do not shave Anesthetic (Use 'Patient Medications' Section for Anesthetic Order Entry): Lidocaine applied to wound bed Edema Control - Lymphedema / Segmental Compressive Device / Other: Patient to wear own compression stockings. Remove compression stockings every night before going to bed and put on every  morning when getting up. Elevate, Exercise Daily and Avoid Standing for Long Periods of Time. Elevate legs to  the level of the heart and pump ankles as often as possible Elevate leg(s) parallel to the floor when sitting. DO YOUR BEST to sleep in the bed at night. DO NOT sleep in your recliner. Long hours of sitting in a recliner leads to swelling of the legs and/or potential wounds on your backside. Off-Loading: Wound #16 Left,Distal Toe Fourth: Open toe surgical shoe - keep pressure off wound/toes Other: - suggest float heels when in bed with a pillow to keep pressure off of heels; choose shoewear for right foot to not rub foot Additional Orders / Instructions: Follow Nutritious Diet and Increase Protein Intake Medications-Please add to medication list.: P.O. Antibiotics - Continue as directed WOUND #16: - Toe Fourth Wound Laterality: Left, Distal Cleanser: Soap and Water (Home Health) (Generic) 3 x Per Week/15 Days Discharge Instructions: Gently cleanse wound with antibacterial soap, rinse and pat dry prior to dressing wounds Cleanser: Wound Cleanser (Home Health) (Generic) 3 x Per Week/15 Days Discharge Instructions: Wash your hands with soap and water. Remove old dressing, discard into plastic bag and place into trash. Cleanse the wound with Wound Cleanser prior to applying a clean dressing using gauze sponges, not tissues or cotton balls. Do not scrub or use excessive force. Pat dry using gauze sponges, not tissue or cotton balls. Primary Dressing: Xeroform-HBD 2x2 (in/in) 3 x Per Week/15 Days Discharge Instructions: Apply Xeroform-HBD 2x2 (in/in) as directed Add-Ons: Coverlet Latex-Free Fabric Adhesive Dressings (Home Health) (Generic) 3 x Per Week/15 Days Discharge Instructions: Elastic Knuckle Bandage 1. Would recommend currently that we going continue with the wound care measures as before and the patient is in agreement the plan the only change we can make it switching from  collagen to Xeroform just to see if we keep this area nice and open and prevent it from sealing up with a dry dressing stuck to the top of this pinpoint opening. 2. I am also can recommend we continue to put a bulky dressing for offloading. 3. I am also can recommend that we have the patient use offloading shoe to keep pressure off of her toe. We will see patient back for reevaluation in 1 week here in the clinic. If anything worsens or changes patient will contact our office for additional recommendations. MILAGROS, MIDDENDORF ([id number]) Electronic Signature(s) Signed: 02/25/2021 8:11:54 AM By: Worthy Keeler PA-C Entered By: Worthy Keeler on 02/25/2021 08:11:53 EDYTHE, RICHES ([id number]) -------------------------------------------------------------------------------- SuperBill Details Patient Name: [name] Date of Service: 02/24/2021 Medical Record Number: [id number] Patient Account Number: [id number] Date of Birth/Sex: 09/20/1950 (70 y.o. F) Treating RN: Donnamarie Poag Primary Care Provider: [name], Olivia Mackie Other Clinician: Referring Provider: [name], Olivia Mackie Treating Provider/Extender: Skipper Cliche in Treatment: 34 Diagnosis Coding ICD-10 Codes Code Description E11.621 Type 2 diabetes mellitus with foot ulcer M86.572 Other chronic hematogenous osteomyelitis, left ankle and foot L97.522 Non-pressure chronic ulcer of other part of left foot with fat layer exposed I87.2 Venous insufficiency (chronic) (peripheral) L97.812 Non-pressure chronic ulcer of other part of right lower leg with fat layer exposed L97.822 Non-pressure chronic ulcer of other part of left lower leg with fat layer exposed Z79.01 Long term (current) use of anticoagulants Facility Procedures The patient participates with Medicare or their insurance follows the Medicare Facility Guidelines: CPT4 Code Description Modifier Quantity [id number] 97597 - DEBRIDE WOUND 1ST 20 SQ CM OR < 1 The  patient participates with Medicare or their insurance follows the Medicare Facility Guidelines: ICD-10 Diagnosis Description L97.522 Non-pressure chronic ulcer of other part  of left foot with fat layer exposed Physician Procedures CPT4 Code: 8882800 Description: 97597 - WC PHYS DEBR WO ANESTH 20 SQ CM Modifier: Quantity: 1 CPT4 Code: Description: ICD-10 Diagnosis Description L97.522 Non-pressure chronic ulcer of other part of left foot with fat layer expo Modifier: sed Quantity: Electronic Signature(s) Signed: 02/24/2021 5:13:35 PM By: Worthy Keeler PA-C Previous Signature: 02/24/2021 4:08:07 PM Version By: Donnamarie Poag Entered By: Worthy Keeler on 02/24/2021 17:13:35

## 2021-02-24 NOTE — Telephone Encounter (Signed)
[name]    Fax this to [facility]  Need response Dr. Alice [name] please   Pt is anemic  Given age and decline in hemoglobin 2 points and chronic medical issues any further work up from GI standpoint?   Labs [facility] 01/29/21 outside  h/h 9.6/32.8 from 08/22/20 labs 11.1/34.5   Tibc 452 H nl 250-450  UIBC 421 H nl 118-369 Iron 31 nl 27-139 Iron sat 7 low nl 15-55 Ferritin 15 nl 15-150    Dawn  make sure pt keeps appt 05/2021 and comes in person as well

## 2021-02-25 ENCOUNTER — Ambulatory Visit: Payer: Medicare HMO | Admitting: Obstetrics and Gynecology

## 2021-02-25 NOTE — Telephone Encounter (Signed)
Left message to return call.  Faxed note via epic routing to Dr Alice Reichert

## 2021-02-26 ENCOUNTER — Ambulatory Visit: Payer: Medicare HMO

## 2021-02-26 DIAGNOSIS — E1142 Type 2 diabetes mellitus with diabetic polyneuropathy: Secondary | ICD-10-CM | POA: Diagnosis not present

## 2021-02-26 DIAGNOSIS — I872 Venous insufficiency (chronic) (peripheral): Secondary | ICD-10-CM | POA: Diagnosis not present

## 2021-02-26 DIAGNOSIS — E1152 Type 2 diabetes mellitus with diabetic peripheral angiopathy with gangrene: Secondary | ICD-10-CM | POA: Diagnosis not present

## 2021-02-26 DIAGNOSIS — I69319 Unspecified symptoms and signs involving cognitive functions following cerebral infarction: Secondary | ICD-10-CM | POA: Diagnosis not present

## 2021-02-26 DIAGNOSIS — E1159 Type 2 diabetes mellitus with other circulatory complications: Secondary | ICD-10-CM | POA: Diagnosis not present

## 2021-02-26 DIAGNOSIS — I69320 Aphasia following cerebral infarction: Secondary | ICD-10-CM | POA: Diagnosis not present

## 2021-02-26 DIAGNOSIS — J449 Chronic obstructive pulmonary disease, unspecified: Secondary | ICD-10-CM | POA: Diagnosis not present

## 2021-02-26 DIAGNOSIS — G8194 Hemiplegia, unspecified affecting left nondominant side: Secondary | ICD-10-CM | POA: Diagnosis not present

## 2021-02-26 DIAGNOSIS — I4819 Other persistent atrial fibrillation: Secondary | ICD-10-CM | POA: Diagnosis not present

## 2021-02-26 DIAGNOSIS — I152 Hypertension secondary to endocrine disorders: Secondary | ICD-10-CM | POA: Diagnosis not present

## 2021-02-26 DIAGNOSIS — I639 Cerebral infarction, unspecified: Secondary | ICD-10-CM | POA: Insufficient documentation

## 2021-02-26 NOTE — Telephone Encounter (Signed)
Left message to return call 

## 2021-02-26 NOTE — Addendum Note (Signed)
Addended by: Orland Mustard on: 02/26/2021 02:08 PM   Modules accepted: Orders

## 2021-02-26 NOTE — Telephone Encounter (Signed)
Patient agreeable to referral, wanting to be referred to [facility]   Wanting to be scheduled after Christmas.

## 2021-02-28 ENCOUNTER — Telehealth: Payer: Self-pay

## 2021-02-28 DIAGNOSIS — G8194 Hemiplegia, unspecified affecting left nondominant side: Secondary | ICD-10-CM | POA: Diagnosis not present

## 2021-02-28 DIAGNOSIS — I69319 Unspecified symptoms and signs involving cognitive functions following cerebral infarction: Secondary | ICD-10-CM | POA: Diagnosis not present

## 2021-02-28 DIAGNOSIS — I872 Venous insufficiency (chronic) (peripheral): Secondary | ICD-10-CM | POA: Diagnosis not present

## 2021-02-28 DIAGNOSIS — I152 Hypertension secondary to endocrine disorders: Secondary | ICD-10-CM | POA: Diagnosis not present

## 2021-02-28 DIAGNOSIS — E1152 Type 2 diabetes mellitus with diabetic peripheral angiopathy with gangrene: Secondary | ICD-10-CM | POA: Diagnosis not present

## 2021-02-28 DIAGNOSIS — J449 Chronic obstructive pulmonary disease, unspecified: Secondary | ICD-10-CM | POA: Diagnosis not present

## 2021-02-28 DIAGNOSIS — E1159 Type 2 diabetes mellitus with other circulatory complications: Secondary | ICD-10-CM | POA: Diagnosis not present

## 2021-02-28 DIAGNOSIS — I4819 Other persistent atrial fibrillation: Secondary | ICD-10-CM | POA: Diagnosis not present

## 2021-02-28 DIAGNOSIS — I69320 Aphasia following cerebral infarction: Secondary | ICD-10-CM | POA: Diagnosis not present

## 2021-02-28 DIAGNOSIS — E1142 Type 2 diabetes mellitus with diabetic polyneuropathy: Secondary | ICD-10-CM | POA: Diagnosis not present

## 2021-02-28 NOTE — Telephone Encounter (Signed)
Called and LVM in regards to upcoming appointment. Would like if patient could bring in Sd card.

## 2021-03-03 ENCOUNTER — Other Ambulatory Visit: Payer: Self-pay | Admitting: Nurse Practitioner

## 2021-03-03 ENCOUNTER — Other Ambulatory Visit: Payer: Self-pay | Admitting: Cardiovascular Disease

## 2021-03-03 ENCOUNTER — Other Ambulatory Visit: Payer: Self-pay | Admitting: Family

## 2021-03-03 DIAGNOSIS — Z8673 Personal history of transient ischemic attack (TIA), and cerebral infarction without residual deficits: Secondary | ICD-10-CM

## 2021-03-03 NOTE — Telephone Encounter (Signed)
This is a [place] pt 

## 2021-03-04 ENCOUNTER — Ambulatory Visit: Payer: Medicare HMO | Admitting: Adult Health

## 2021-03-04 DIAGNOSIS — E1159 Type 2 diabetes mellitus with other circulatory complications: Secondary | ICD-10-CM | POA: Diagnosis not present

## 2021-03-04 DIAGNOSIS — E1152 Type 2 diabetes mellitus with diabetic peripheral angiopathy with gangrene: Secondary | ICD-10-CM | POA: Diagnosis not present

## 2021-03-04 DIAGNOSIS — G8194 Hemiplegia, unspecified affecting left nondominant side: Secondary | ICD-10-CM | POA: Diagnosis not present

## 2021-03-04 DIAGNOSIS — E1142 Type 2 diabetes mellitus with diabetic polyneuropathy: Secondary | ICD-10-CM | POA: Diagnosis not present

## 2021-03-04 DIAGNOSIS — I152 Hypertension secondary to endocrine disorders: Secondary | ICD-10-CM | POA: Diagnosis not present

## 2021-03-04 DIAGNOSIS — I4819 Other persistent atrial fibrillation: Secondary | ICD-10-CM | POA: Diagnosis not present

## 2021-03-04 DIAGNOSIS — J449 Chronic obstructive pulmonary disease, unspecified: Secondary | ICD-10-CM | POA: Diagnosis not present

## 2021-03-04 DIAGNOSIS — I872 Venous insufficiency (chronic) (peripheral): Secondary | ICD-10-CM | POA: Diagnosis not present

## 2021-03-04 DIAGNOSIS — I69319 Unspecified symptoms and signs involving cognitive functions following cerebral infarction: Secondary | ICD-10-CM | POA: Diagnosis not present

## 2021-03-04 DIAGNOSIS — I69320 Aphasia following cerebral infarction: Secondary | ICD-10-CM | POA: Diagnosis not present

## 2021-03-04 NOTE — Telephone Encounter (Signed)
This is a [place] pt 

## 2021-03-05 ENCOUNTER — Telehealth: Payer: Self-pay | Admitting: Internal Medicine

## 2021-03-05 ENCOUNTER — Other Ambulatory Visit: Payer: Self-pay | Admitting: Internal Medicine

## 2021-03-05 ENCOUNTER — Ambulatory Visit: Payer: Medicare HMO

## 2021-03-05 DIAGNOSIS — K219 Gastro-esophageal reflux disease without esophagitis: Secondary | ICD-10-CM

## 2021-03-05 MED ORDER — PANTOPRAZOLE SODIUM 40 MG PO TBEC
40.0000 mg | DELAYED_RELEASE_TABLET | Freq: Every day | ORAL | 3 refills | Status: DC
Start: 2021-03-05 — End: 2021-12-01

## 2021-03-05 NOTE — Telephone Encounter (Signed)
Pt called in regards to medication she is needing refilled pantoprazole (PROTONIX) 40 MG tablet   Pt has an appt scheduled with PCP 3/7 at [facility]  Pt uses [place].

## 2021-03-05 NOTE — Telephone Encounter (Signed)
Medication not previously in your name. Okay to send in?

## 2021-03-05 NOTE — Telephone Encounter (Signed)
Noted  

## 2021-03-05 NOTE — Telephone Encounter (Signed)
Sent to [facility]

## 2021-03-07 ENCOUNTER — Other Ambulatory Visit: Payer: Self-pay

## 2021-03-07 ENCOUNTER — Encounter: Payer: Medicare HMO | Admitting: Physician Assistant

## 2021-03-07 DIAGNOSIS — L97522 Non-pressure chronic ulcer of other part of left foot with fat layer exposed: Secondary | ICD-10-CM | POA: Diagnosis not present

## 2021-03-07 DIAGNOSIS — I872 Venous insufficiency (chronic) (peripheral): Secondary | ICD-10-CM | POA: Diagnosis not present

## 2021-03-07 DIAGNOSIS — E1140 Type 2 diabetes mellitus with diabetic neuropathy, unspecified: Secondary | ICD-10-CM | POA: Diagnosis not present

## 2021-03-07 DIAGNOSIS — E11621 Type 2 diabetes mellitus with foot ulcer: Secondary | ICD-10-CM | POA: Diagnosis not present

## 2021-03-07 DIAGNOSIS — L97322 Non-pressure chronic ulcer of left ankle with fat layer exposed: Secondary | ICD-10-CM | POA: Diagnosis not present

## 2021-03-07 DIAGNOSIS — L97822 Non-pressure chronic ulcer of other part of left lower leg with fat layer exposed: Secondary | ICD-10-CM | POA: Diagnosis not present

## 2021-03-07 DIAGNOSIS — L89899 Pressure ulcer of other site, unspecified stage: Secondary | ICD-10-CM | POA: Diagnosis not present

## 2021-03-07 DIAGNOSIS — L97812 Non-pressure chronic ulcer of other part of right lower leg with fat layer exposed: Secondary | ICD-10-CM | POA: Diagnosis not present

## 2021-03-07 DIAGNOSIS — M86572 Other chronic hematogenous osteomyelitis, left ankle and foot: Secondary | ICD-10-CM | POA: Diagnosis not present

## 2021-03-07 DIAGNOSIS — E1151 Type 2 diabetes mellitus with diabetic peripheral angiopathy without gangrene: Secondary | ICD-10-CM | POA: Diagnosis not present

## 2021-03-07 NOTE — Progress Notes (Signed)
TAFFANY, HEISER ([id number]) Visit Report for 03/07/2021 Arrival Information Details Patient Name: [name], [name] Date of Service: 03/07/2021 1:45 PM Medical Record Number: [id number] Patient Account Number: [id number] Date of Birth/Sex: 10-05-1950 (70 y.o. F) Treating RN: Donnamarie Poag Primary Care Amaziah Raisanen: [name], Olivia Mackie Other Clinician: Referring Yanice Maqueda: [name], Olivia Mackie Treating Deshay Blumenfeld/Extender: Skipper Cliche in Treatment: 73 Visit Information History Since Last Visit Added or deleted any medications: No Patient Arrived: Wheel Chair Had a fall or experienced change in No Arrival Time: 14:08 activities of daily living that may affect Accompanied By: husband risk of falls: Transfer Assistance: EasyPivot Patient Lift Hospitalized since last visit: No Patient Identification Verified: Yes Has Dressing in Place as Prescribed: Yes Secondary Verification Process Completed: Yes Pain Present Now: Yes Patient Requires Transmission-Based No Precautions: Patient Has Alerts: Yes Patient Alerts: Patient on Blood Thinner Eliquis Diabetic II 06/2020 ABI L)1.22 R) 1.14 Electronic Signature(s) Signed: 03/07/2021 4:30:01 PM By: Donnamarie Poag Entered By: Donnamarie Poag on 03/07/2021 14:10:10 [name] ([id number]) -------------------------------------------------------------------------------- Clinic Level of Care Assessment Details Patient Name: [name] Date of Service: 03/07/2021 1:45 PM Medical Record Number: [id number] Patient Account Number: [id number] Date of Birth/Sex: Jul 17, 1950 (70 y.o. F) Treating RN: Donnamarie Poag Primary Care Rubylee Zamarripa: [name], Olivia Mackie Other Clinician: Referring Lorrain Rivers: [name], Olivia Mackie Treating Caroll Cunnington/Extender: Skipper Cliche in Treatment: 26 Clinic Level of Care Assessment Items TOOL 4 Quantity Score []  - Use when only an EandM is performed on FOLLOW-UP visit 0 ASSESSMENTS - Nursing Assessment /  Reassessment []  - Reassessment of Co-morbidities (includes updates in patient status) 0 []  - 0 Reassessment of Adherence to Treatment Plan ASSESSMENTS - Wound and Skin Assessment / Reassessment X - Simple Wound Assessment / Reassessment - one wound 1 5 []  - 0 Complex Wound Assessment / Reassessment - multiple wounds []  - 0 Dermatologic / Skin Assessment (not related to wound area) ASSESSMENTS - Focused Assessment []  - Circumferential Edema Measurements - multi extremities 0 []  - 0 Nutritional Assessment / Counseling / Intervention []  - 0 Lower Extremity Assessment (monofilament, tuning fork, pulses) []  - 0 Peripheral Arterial Disease Assessment (using hand held doppler) ASSESSMENTS - Ostomy and/or Continence Assessment and Care []  - Incontinence Assessment and Management 0 []  - 0 Ostomy Care Assessment and Management (repouching, etc.) PROCESS - Coordination of Care X - Simple Patient / Family Education for ongoing care 1 15 []  - 0 Complex (extensive) Patient / Family Education for ongoing care []  - 0 Staff obtains Programmer, systems, Records, Test Results / Process Orders X- 1 10 Staff telephones HHA, Nursing Homes / Clarify orders / etc []  - 0 Routine Transfer to another Facility (non-emergent condition) []  - 0 Routine Hospital Admission (non-emergent condition) []  - 0 New Admissions / Biomedical engineer / Ordering NPWT, Apligraf, etc. []  - 0 Emergency Hospital Admission (emergent condition) X- 1 10 Simple Discharge Coordination []  - 0 Complex (extensive) Discharge Coordination PROCESS - Special Needs []  - Pediatric / Minor Patient Management 0 []  - 0 Isolation Patient Management []  - 0 Hearing / Language / Visual special needs []  - 0 Assessment of Community assistance (transportation, D/C planning, etc.) []  - 0 Additional assistance / Altered mentation []  - 0 Support Surface(s) Assessment (bed, cushion, seat, etc.) INTERVENTIONS - Wound Cleansing /  Measurement Snowberger, Rand B. ([id number]) X- 1 5 Simple Wound Cleansing - one wound []  - 0 Complex Wound Cleansing - multiple wounds X- 1 5 Wound Imaging (photographs - any number of wounds) []  - 0 Wound Tracing (instead of  photographs) X- 1 5 Simple Wound Measurement - one wound []  - 0 Complex Wound Measurement - multiple wounds INTERVENTIONS - Wound Dressings X - Small Wound Dressing one or multiple wounds 1 10 []  - 0 Medium Wound Dressing one or multiple wounds []  - 0 Large Wound Dressing one or multiple wounds X- 1 5 Application of Medications - topical []  - 0 Application of Medications - injection INTERVENTIONS - Miscellaneous []  - External ear exam 0 []  - 0 Specimen Collection (cultures, biopsies, blood, body fluids, etc.) []  - 0 Specimen(s) / Culture(s) sent or taken to Lab for analysis []  - 0 Patient Transfer (multiple staff / Civil Service fast streamer / Similar devices) []  - 0 Simple Staple / Suture removal (25 or less) []  - 0 Complex Staple / Suture removal (26 or more) []  - 0 Hypo / Hyperglycemic Management (close monitor of Blood Glucose) []  - 0 Ankle / Brachial Index (ABI) - do not check if billed separately X- 1 5 Vital Signs Has the patient been seen at the hospital within the last three years: Yes Total Score: 75 Level Of Care: New/Established - Level 2 Electronic Signature(s) Signed: 03/07/2021 4:30:01 PM By: Donnamarie Poag Entered By: Donnamarie Poag on 03/07/2021 14:28:58 [name] ([id number]) -------------------------------------------------------------------------------- Encounter Discharge Information Details Patient Name: [name] Date of Service: 03/07/2021 1:45 PM Medical Record Number: [id number] Patient Account Number: [id number] Date of Birth/Sex: 11-11-1950 (70 y.o. F) Treating RN: Donnamarie Poag Primary Care Donnia Poplaski: [name], Olivia Mackie Other Clinician: Referring Myrtle Barnhard: [name], Olivia Mackie Treating Lavern Crimi/Extender: Skipper Cliche in Treatment: 62 Encounter Discharge Information Items Discharge Condition: Stable Ambulatory Status: Wheelchair Discharge Destination: Home Transportation: Private Auto Accompanied By: husband Schedule Follow-up Appointment: Yes Clinical Summary of Care: Electronic Signature(s) Signed: 03/07/2021 4:30:01 PM By: Donnamarie Poag Entered By: Donnamarie Poag on 03/07/2021 14:36:10 [name] ([id number]) -------------------------------------------------------------------------------- Lower Extremity Assessment Details Patient Name: [name] Date of Service: 03/07/2021 1:45 PM Medical Record Number: [id number] Patient Account Number: [id number] Date of Birth/Sex: Jul 04, 1950 (70 y.o. F) Treating RN: Donnamarie Poag Primary Care Jomar Denz: [name], Olivia Mackie Other Clinician: Referring Jacory Kamel: [name], Olivia Mackie Treating Tionne Carelli/Extender: Skipper Cliche in Treatment: 36 Edema Assessment Assessed: [Left: Yes] [Right: No] Edema: [Left: N] [Right: o] Calf Left: Right: Point of Measurement: From Medial Instep 30 cm Ankle Left: Right: Point of Measurement: From Medial Instep 17 cm Vascular Assessment Pulses: Dorsalis Pedis Palpable: [Left:Yes] Electronic Signature(s) Signed: 03/07/2021 4:30:01 PM By: Donnamarie Poag Entered By: Donnamarie Poag on 03/07/2021 14:19:10 Decandia, [name] ([id number]) -------------------------------------------------------------------------------- Multi Wound Chart Details Patient Name: [name] Date of Service: 03/07/2021 1:45 PM Medical Record Number: [id number] Patient Account Number: [id number] Date of Birth/Sex: Nov 08, 1950 (70 y.o. 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 Comorbid History: Asthma, Pneumothorax, N/A N/A Arrhythmia, Hypertension, Type II Diabetes, Gout, Osteoarthritis, Neuropathy Date Acquired: 10/31/2020 N/A N/A Weeks of Treatment: 14 N/A N/A Wound Status: Open N/A N/A Measurements L x W x D (cm) 0.1x0.1x0.1 N/A N/A Area (cm) : 0.008 N/A N/A Volume (cm) : 0.001 N/A N/A % Reduction in Area: 99.00% N/A N/A % Reduction in Volume: 98.70% N/A N/A Classification: Grade  3 N/A N/A Exudate Amount: None Present N/A N/A Wound Margin: Thickened N/A N/A Granulation Amount: None Present (0%) N/A N/A Necrotic Amount: Large (67-100%) N/A N/A Necrotic Tissue: Eschar N/A N/A Exposed Structures: Fat Layer (Subcutaneous Tissue): N/A N/A Yes Bone: Yes Fascia: No Tendon: No Muscle: No Joint: No Treatment Notes Electronic Signature(s) Signed: 03/07/2021 4:30:01 PM By: Donnamarie Poag Entered By: Donnamarie Poag on 03/07/2021 14:23:22 [name] ([id number]) -------------------------------------------------------------------------------- [place] Details Patient Name: [name] Date of Service: 03/07/2021 1:45 PM Medical Record Number: [id number] Patient Account Number: [id number] Date of Birth/Sex: 1951-03-07 (70 y.o. F) Treating RN: Donnamarie Poag Primary Care Manjinder Breau: [name], Olivia Mackie Other Clinician: Referring Zakyla Tonche: [name], Olivia Mackie Treating Novak Stgermaine/Extender: Skipper Cliche in Treatment: 39 Active Inactive Electronic Signature(s) Signed: 03/07/2021 4:30:01 PM By: Donnamarie Poag Entered By: Donnamarie Poag on 03/07/2021 14:19:38 Rowland, [name]  ([id number]) -------------------------------------------------------------------------------- Pain Assessment Details Patient Name: [name] Date of Service: 03/07/2021 1:45 PM Medical Record Number: [id number] Patient Account Number: [id number] Date of Birth/Sex: Feb 18, 1951 (70 y.o. F) Treating RN: Donnamarie Poag Primary Care Myreon Wimer: [name], Olivia Mackie Other Clinician: Referring Analy Bassford: [name], Olivia Mackie Treating Elianna Windom/Extender: Skipper Cliche in Treatment: 36 Active Problems Location of Pain Severity and Description of Pain Patient Has Paino Yes Site Locations Pain Location: Generalized Pain Rate the pain. Current Pain Level: 8 Pain Management and Medication Current Pain Management: Notes L shoulder Electronic Signature(s) Signed: 03/07/2021 4:30:01 PM By: Donnamarie Poag Entered By: Donnamarie Poag on 03/07/2021 14:11:21 [name] ([id number]) -------------------------------------------------------------------------------- Patient/Caregiver Education Details Patient Name: [name] Date of Service: 03/07/2021 1:45 PM Medical Record Number: [id number] Patient Account Number: [id number] Date of Birth/Gender: 03-07-1951 (70 y.o. F) Treating RN: Donnamarie Poag Primary Care Physician: [name], Olivia Mackie Other Clinician: Referring Physician: [name], Olivia Mackie Treating Physician/Extender: Skipper Cliche in Treatment: 77 Education Assessment Education Provided To: Patient Education Topics Provided Basic Hygiene: Wound/Skin Impairment: Engineer, maintenance) Signed: 03/07/2021 4:30:01 PM By: Donnamarie Poag Entered By: Donnamarie Poag on 03/07/2021 14:30:37 [name] ([id number]) -------------------------------------------------------------------------------- Wound Assessment Details Patient Name: [name] Date of Service: 03/07/2021 1:45 PM Medical Record Number: [id number] Patient Account Number: [id number] Date of  Birth/Sex: 01-09-1951 (70 y.o. F) Treating RN: Donnamarie Poag Primary Care Jaki Steptoe: [name], Olivia Mackie Other Clinician: Referring Erlinda Solinger: [name], Olivia Mackie Treating Magan Winnett/Extender: Skipper Cliche in Treatment: 36 Wound Status Wound Number: 16 Primary Diabetic Wound/Ulcer of the Lower Extremity Etiology: Wound Location: Left, Distal Toe Fourth Wound Open Wounding Event: Pressure Injury Status: Date Acquired: 10/31/2020 Notes: stated toenail was removed by podiatry doctor on Weeks Of Treatment: 14 11/08/20 Clustered Wound: No Comorbid Asthma, Pneumothorax, Arrhythmia, Hypertension, Type History: II Diabetes, Gout, Osteoarthritis, Neuropathy Photos Wound Measurements Length: (cm) 0.1 Width: (cm) 0.1 Depth: (cm) 0.1 Area: (cm) 0.008 Volume: (cm) 0.001 % Reduction in Area: 99% % Reduction in Volume: 98.7% Tunneling: No Undermining: No Wound Description Classification: Grade 3 Wound Margin: Thickened Exudate Amount: None Present Foul Odor After Cleansing: No Slough/Fibrino Yes Wound Bed Granulation Amount: None Present (0%) Exposed Structure Necrotic Amount: Large (67-100%) Fascia Exposed: No Necrotic Quality: Eschar Fat Layer (Subcutaneous Tissue) Exposed: Yes Tendon Exposed: No Muscle Exposed: No Joint Exposed: No Bone Exposed: Yes Treatment Notes Wound #16 (Toe Fourth) Wound Laterality: Left, Distal Cleanser Soap and Water Discharge Instruction: Gently cleanse wound with antibacterial soap, rinse and pat dry prior to dressing wounds Wound Cleanser Savastano, Analise B. ([id number]) Discharge Instruction: Wash your hands with soap and water. Remove old dressing, discard into plastic bag and place  into trash. Cleanse the wound with Wound Cleanser prior to applying a clean dressing using gauze sponges, not tissues or cotton balls. Do not scrub or use excessive force. Pat dry using gauze sponges, not tissue or cotton balls. Peri-Wound Care Topical Primary  Dressing Xeroform-HBD 2x2 (in/in) Discharge Instruction: Apply Xeroform-HBD 2x2 (in/in) as directed Secondary Dressing Secured With Compression Wrap Compression Stockings Add-Ons Coverlet Latex-Free Fabric Adhesive Dressings Discharge Instruction: Elastic Knuckle Bandage Electronic Signature(s) Signed: 03/07/2021 4:30:01 PM By: Donnamarie Poag Entered By: Donnamarie Poag on 03/07/2021 14:16:18 Seifried, [name] ([id number]) -------------------------------------------------------------------------------- Vitals Details Patient Name: [name] Date of Service: 03/07/2021 1:45 PM Medical Record Number: [id number] Patient Account Number: [id number] Date of Birth/Sex: 1950-03-28 (70 y.o. F) Treating RN: Donnamarie Poag Primary Care Lovey Crupi: [name], Olivia Mackie Other Clinician: Referring Nickalos Petersen: [name], Olivia Mackie Treating Yer Castello/Extender: Skipper Cliche in Treatment: 36 Vital Signs Time Taken: 14:08 Temperature (F): 97.1 Height (in): 65.5 Pulse (bpm): 84 Weight (lbs): 240 Respiratory Rate (breaths/min): 16 Body Mass Index (BMI): 39.3 Blood Pressure (mmHg): 135/77 Reference Range: 80 - 120 mg / dl Electronic Signature(s) Signed: 03/07/2021 4:30:01 PM By: Donnamarie Poag Entered ByDonnamarie Poag on 03/07/2021 14:10:53

## 2021-03-07 NOTE — Progress Notes (Addendum)
[name], [name] ([id number]) Visit Report for 03/07/2021 Chief Complaint Document Details Patient Name: [name], [name] Date of Service: 03/07/2021 1:45 PM Medical Record Number: [id number] Patient Account Number: [id number] Date of Birth/Sex: 08/16/1950 (70 y.o. F) Treating RN: Donnamarie Poag Primary Care Provider: [name], Olivia Mackie Other Clinician: Referring Provider: [name], Olivia Mackie Treating Provider/Extender: Skipper Cliche in Treatment: 27 Information Obtained from: Patient Chief Complaint Right Leg Ulcer and Right 4thToe Ulcer Electronic Signature(s) Signed: 03/07/2021 2:20:49 PM By: Worthy Keeler PA-C Entered By: Worthy Keeler on 03/07/2021 14:20:49 [name], [name] ([id number]) -------------------------------------------------------------------------------- HPI Details Patient Name: [name] Date of Service: 03/07/2021 1:45 PM Medical Record Number: [id number] Patient Account Number: [id number] Date of Birth/Sex: 10/18/1950 (70 y.o. F) Treating RN: Donnamarie Poag Primary Care Provider: [name], Olivia Mackie Other Clinician: Referring Provider: [name], Olivia Mackie Treating Provider/Extender: Skipper Cliche in Treatment: 65 History of Present Illness HPI Description: 06/28/20 upon evaluation today patient appears to be doing unfortunately somewhat poorly in regard to wounds that she has over her lower extremities. She has a wound on the left distal second toe, left posterior heel, and right anterior lower leg. With that being said all in all none of the wounds appear to be too bad the one that hurts her the most is actually the wound on the posterior heel which actually is also one of the smallest. Nonetheless I think there is some callus hiding what is really going on in this area. Fortunately there does not appear to be any obvious evidence of infection which is great news. Patient does have a history of diabetes mellitus type 2, chronic venous  insufficiency, hypertension, history of DVTs, and is on long-term anticoagulant therapy, Eliquis. 07/05/20 upon evaluation today patient appears to be doing well currently with regard to her wounds. I feel like she is making progress which is great news and overall there is no signs of active infection at this time. No fevers, chills, nausea, vomiting, or diarrhea. 4/29; patient has a only a small area remaining on the tip of her left second toe and a small area remaining on the left heel. The area on the right anterior lower leg is healed. They tell me that she had remote podiatric surgery on the toes of the left foot however they are very fixed in position and I wonder whether they are making it difficult to relieve pressure in her foot wear. She is a type II diabetic with neuropathy as well. 08/01/2020 upon evaluation today patient appears to be doing well currently in regard to her wounds. In fact everything appears to be doing much better. The posterior aspect of her heel is actually giving her some trouble not the Achilles area but a small wound that we did not even have marked. In fact I had noted this before. Nonetheless we are going to addressed that today. 5/27; most of the patient's wounds are healed including the left Achilles heel left toe. Right forearm is also healed. She has a new abrasion posteriorly in the right elbow area just above the olecranon this happened today with an abrasion from her car door 6/15; the patient's wounds on the left Achilles and left second toe remain healed. The area on the right forearm is also just about healed in fact the skin I replaced last time is looking viable which is good. She had me look at the left heel again which is not in the area of the wound more towards the tip of the heel at  the Achilles insertion site as well as the tip of the left foot second toe. In the Achilles area that is clearly is friction probably in bed at night. Not really near  where the wound was. The left second toe remains healed although this is hammered and overhanging first toe also puts pressure in this area 09/13/2020 upon evaluation today patient appears to be doing well with regard to all previous wounds that she had. Everything is completely healed. With that being said I do think that the patient unfortunately has a new skin tear on the right forearm which is due to her put a Band-Aid on it and then when it pulled off this caused some issues with skin tearing. Nonetheless I think that this needs to be addressed today. Hopefully will take too long to get this to heal. She is having some issues with pain in her heel but I think this is more related to be honest to her neuropathy as opposed to anything else. Readmission: 10/30/2020 upon evaluation today patient appears to be doing poorly in regard to her legs bilaterally. She is also having an issue with her fourth toe on the left. Fortunately there does not appear to be any signs of active infection at this time. No fevers, chills, nausea, vomiting, or diarrhea. It is actually been quite a while since we have seen her. In fact June 24 was the last time that she was here in the clinic. She tells me she did not come back because things were just doing "pretty well". 11/29/2020 upon evaluation today patient actually appears to be doing well with regard to her arms those are completely healed. Her right leg is still open but doing okay. The fourth toe of the left foot does show some signs here of having some issues with necrotic tissue of an open wound. This is where a toenail was removed by podiatry. Subsequently I do think that we need to go ahead and see what we can do about getting this cleared away so being try to get some healing going forward. 12/13/2020 upon evaluation today patient appears to be doing decently well in regard to her wound on the right leg. In general I think that she is making good progress here.  Unfortunately she continues to have significant issues with swelling in the left leg is now even more swollen at this point. For that reason I do believe she would be benefited by wrapping both sides although she is not interested in doing this. Overall I think that she is definitely making some good progress here nonetheless in regard to the right leg left leg leave some to be desired. 12/27/2020 upon evaluation today patient appears to be doing well with regard to her wound in regard to the legs. Unfortunately the toe was not doing nearly as well. I am much more concerned about infection and osteomyelitis here. She had the MRI but right now were not seeing obvious evidence of redness spreading up to her foot this is good news I do not have the result of the MRI as of yet. I am good to place her on doxycycline however due to the fact that the wound does seem to be getting a little worse in regard to her toe. 01/14/2021 upon evaluation today patient appears to be doing well with regard to her ulcers. The right leg is doing well and even the toe seems to be doing well though she still has some evidence of bone exposed on  the toe which does not appear to be doing quite as good as I would like to see just and the fact that is still not covered over new tissue but looks tremendously better compared to 2 weeks ago. Overall very pleased in that regard. 01/28/2021 upon evaluation today patient's leg on the left actually appears to have new wounds this is due to her deciding to shave yesterday. Fortunately there does not appear to be any signs of infection currently. This is good news. Nonetheless I do believe that with these new areas open it would be best to wrap her leg to try to make sure we get this under control sooner rather than later. With regard to her toe there still does appear to be some evidence of bone exposed this was covered over with callus I did remove that today. Nonetheless I think this is  significantly smaller than previous. 02/11/2021 upon evaluation today patient appears to be doing okay in regard to her leg ulcers. I do not see anything obvious at this point this seems to be a complicating factor. I think that she is getting better. Were wrap of the right leg not the left leg. With that being said the left fourth Vaughan, Yuna B. ([id number]) toe ulcer still does have evidence of being open there is some drainage still on top of the wound I think we can to try to see about using something like a collagen dressing today to see if that would be of benefit for her. She is in agreement with giving this a trial. Nonetheless I am concerned about the fact that she continues to have issues here with this not healing it is confirmed chronic osteomyelitis she has been on antibiotics. I am going to extend those today as well. That is Keflex and that will be for 1 additional month. Nonetheless if she is not doing significantly better by the next time I see her I think that the biggest thing is going to be for [place] to go ahead and see what we can do about getting her started with hyperbaric oxygen therapy to try to save the toe. 02/20/2021 upon evaluation today patient presents for follow-up she actually came in a little bit early as the home health nurse was concerned about her toe becoming "gangrenous". With that being said the good news is there is [place] no evidence of this and in fact the toe seems to be doing better at this point. What was over the toe was an area that had bled significantly and then subsequently dried making it look much worse than where things actually stand. There is just a very small opening remaining in the toe. In regard to her legs everything appears to be healed bilaterally. 02/24/2021 upon evaluation today patient appears to be doing well with regard to her toe ulcer. I am actually very pleased with where we stand and I think that she is making good progress at this  time. There is still a very small opening although I think the biggest issue is the collagen seems to be getting stuck and drying up because the wound is so small we probably just need to use something to keep this moist like a little piece of Xeroform gauze and see if we get this to close. 03/07/2021 upon evaluation patient actually appears to be making excellent progress in regard to her last wound on the toe. There is just a very tiny potential area that I can even be sure at first  until I get some of the dry skin off when I did get this removed there was just a very tiny pinpoint opening that was barely even moist. Actually feel like this is very close to complete resolution. She has been using the Xeroform that is doing great. Electronic Signature(s) Signed: 03/07/2021 5:20:25 PM By: Worthy Keeler PA-C Entered By: Worthy Keeler on 03/07/2021 17:20:25 [name], [name] ([id number]) -------------------------------------------------------------------------------- Physical Exam Details Patient Name: [name] Date of Service: 03/07/2021 1:45 PM Medical Record Number: [id number] Patient Account Number: [id number] Date of Birth/Sex: 09-12-1950 (70 y.o. F) Treating RN: Donnamarie Poag Primary Care Provider: [name], Olivia Mackie Other Clinician: Referring Provider: [name], Olivia Mackie Treating Provider/Extender: Skipper Cliche in Treatment: 24 Constitutional Well-nourished and well-hydrated in no acute distress. Respiratory normal breathing without difficulty. Psychiatric this patient is able to make decisions and demonstrates good insight into disease process. Alert and Oriented x 3. pleasant and cooperative. Notes Patient's wound again showed signs of being all but healed today and seems to be doing great I do not see any signs of infection and overall I think we are on the right track. Electronic Signature(s) Signed: 03/07/2021 5:20:43 PM By: Worthy Keeler PA-C Entered  By: Worthy Keeler on 03/07/2021 17:20:43 [name], [name] ([id number]) -------------------------------------------------------------------------------- Physician Orders Details Patient Name: [name] Date of Service: 03/07/2021 1:45 PM Medical Record Number: [id number] Patient Account Number: [id number] Date of Birth/Sex: 04/14/1950 (70 y.o. F) Treating RN: Donnamarie Poag Primary Care Provider: [name], Olivia Mackie Other Clinician: Referring Provider: [name], Olivia Mackie Treating Provider/Extender: Skipper Cliche in Treatment: 22 Verbal / Phone Orders: No Diagnosis Coding ICD-10 Coding Code Description E11.621 Type 2 diabetes mellitus with foot ulcer M86.572 Other chronic hematogenous osteomyelitis, left ankle and foot L97.522 Non-pressure chronic ulcer of other part of left foot with fat layer exposed I87.2 Venous insufficiency (chronic) (peripheral) L97.812 Non-pressure chronic ulcer of other part of right lower leg with fat layer exposed L97.822 Non-pressure chronic ulcer of other part of left lower leg with fat layer exposed Z79.01 Long term (current) use of anticoagulants Follow-up Appointments o Return Appointment in 2 weeks. o Nurse Visit as needed [place] for wound care. May utilize formulary equivalent dressing for wound treatment orders unless otherwise specified. Home Health Nurse may visit PRN to address patientos wound care needs. o Scheduled days for dressing changes to be completed; exception, patient has scheduled wound care visit that day. o **Please direct any NON-WOUND related issues/requests for orders to patient's Primary Care Physician. **If current dressing causes regression in wound condition, may D/C ordered dressing product/s and apply Normal Saline Moist Dressing daily until next [facility] or Other MD appointment. **Notify [facility] of regression  in wound condition at [phone]. Bathing/ Shower/ Hygiene o May shower with wound dressing protected with water repellent cover or cast protector. o No tub bath. o Other: - Be careful of new skin to protect-do not shave Anesthetic (Use 'Patient Medications' Section for Anesthetic Order Entry) o Lidocaine applied to wound bed Edema Control - Lymphedema / Segmental Compressive Device / Other o Patient to wear own compression stockings. Remove compression stockings every night before going to bed and put on every morning when getting up. o Elevate, Exercise Daily and Avoid Standing for Long Periods of Time. o Elevate legs to the level of the heart and pump ankles as often as possible o Elevate leg(s) parallel to the floor when  sitting. o DO YOUR BEST to sleep in the bed at night. DO NOT sleep in your recliner. Long hours of sitting in a recliner leads to swelling of the legs and/or potential wounds on your backside. Off-Loading Wound #16 Left,Distal Toe Fourth o Open toe surgical shoe - keep pressure off wound/toes o Other: - suggest float heels when in bed with a pillow to keep pressure off of heels; choose shoewear for right foot to not rub foot Additional Orders / Instructions o Follow Nutritious Diet and Increase Protein Intake o Other: - Leave Vaseline gauze over steri strips to right forearm skin tear noted prior to visit; keep protected and allow to fall off and protect skin Wound Treatment Wound #16 - Toe Fourth Wound Laterality: Left, Distal Cleanser: Soap and Water (Home Health) (Generic) 3 x Per Week/15 Days LUCINDY, BOREL B. ([id number]) Discharge Instructions: Gently cleanse wound with antibacterial soap, rinse and pat dry prior to dressing wounds Cleanser: Wound Cleanser (Home Health) (Generic) 3 x Per Week/15 Days Discharge Instructions: Wash your hands with soap and water. Remove old dressing, discard into plastic bag and place into  trash. Cleanse the wound with Wound Cleanser prior to applying a clean dressing using gauze sponges, not tissues or cotton balls. Do not scrub or use excessive force. Pat dry using gauze sponges, not tissue or cotton balls. Primary Dressing: Xeroform-HBD 2x2 (in/in) 3 x Per Week/15 Days Discharge Instructions: Apply Xeroform-HBD 2x2 (in/in) as directed Add-Ons: Coverlet Latex-Free Fabric Adhesive Dressings (Home Health) (Generic) 3 x Per Week/15 Days Discharge Instructions: Elastic Knuckle Bandage Electronic Signature(s) Signed: 03/07/2021 4:30:01 PM By: Donnamarie Poag Signed: 03/07/2021 5:41:04 PM By: Worthy Keeler PA-C Entered By: Donnamarie Poag on 03/07/2021 14:30:10 [name] ([id number]) -------------------------------------------------------------------------------- Problem List Details Patient Name: [name] Date of Service: 03/07/2021 1:45 PM Medical Record Number: [id number] Patient Account Number: [id number] Date of Birth/Sex: 1950/07/24 (70 y.o. F) Treating RN: Donnamarie Poag Primary Care Provider: [name], Olivia Mackie Other Clinician: Referring Provider: [name], Olivia Mackie Treating Provider/Extender: Skipper Cliche in Treatment: 17 Active Problems ICD-10 Encounter Code Description Active Date MDM Diagnosis E11.621 Type 2 diabetes mellitus with foot ulcer 06/28/2020 No Yes M86.572 Other chronic hematogenous osteomyelitis, left ankle and foot 02/11/2021 No Yes L97.522 Non-pressure chronic ulcer of other part of left foot with fat layer 06/28/2020 No Yes exposed I87.2 Venous insufficiency (chronic) (peripheral) 06/28/2020 No Yes L97.812 Non-pressure chronic ulcer of other part of right lower leg with fat layer 06/28/2020 No Yes exposed L97.822 Non-pressure chronic ulcer of other part of left lower leg with fat layer 02/11/2021 No Yes exposed Z79.01 Long term (current) use of anticoagulants 06/28/2020 No Yes Inactive Problems ICD-10 Code Description Active Date  Inactive Date I10 Essential (primary) hypertension 06/28/2020 06/28/2020 Z86.718 Personal history of other venous thrombosis and embolism 06/28/2020 06/28/2020 Resolved Problems ICD-10 Code Description Active Date Resolved Date L97.322 Non-pressure chronic ulcer of left ankle with fat layer exposed 06/28/2020 06/28/2020 S50.811A Abrasion of right forearm, initial encounter 10/30/2020 10/30/2020 [name], [name] ([id number]) S50.311D Abrasion of right elbow, subsequent encounter 08/16/2020 08/16/2020 Electronic Signature(s) Signed: 03/07/2021 2:20:43 PM By: Worthy Keeler PA-C Entered By: Worthy Keeler on 03/07/2021 14:20:43 [name], [name] ([id number]) -------------------------------------------------------------------------------- Progress Note Details Patient Name: [name] Date of Service: 03/07/2021 1:45 PM Medical Record Number: [id number] Patient Account Number: [id number] Date of Birth/Sex: 1950-11-23 (70 y.o. F) Treating RN: Donnamarie Poag Primary Care Provider: [name], Olivia Mackie Other Clinician: Referring Provider: [name], Olivia Mackie Treating Provider/Extender: Skipper Cliche in  Treatment: 36 Subjective Chief Complaint Information obtained from Patient Right Leg Ulcer and Right 4thToe Ulcer History of Present Illness (HPI) 06/28/20 upon evaluation today patient appears to be doing unfortunately somewhat poorly in regard to wounds that she has over her lower extremities. She has a wound on the left distal second toe, left posterior heel, and right anterior lower leg. With that being said all in all none of the wounds appear to be too bad the one that hurts her the most is actually the wound on the posterior heel which actually is also one of the smallest. Nonetheless I think there is some callus hiding what is really going on in this area. Fortunately there does not appear to be any obvious evidence of infection which is great news. Patient does have a history of diabetes  mellitus type 2, chronic venous insufficiency, hypertension, history of DVTs, and is on long-term anticoagulant therapy, Eliquis. 07/05/20 upon evaluation today patient appears to be doing well currently with regard to her wounds. I feel like she is making progress which is great news and overall there is no signs of active infection at this time. No fevers, chills, nausea, vomiting, or diarrhea. 4/29; patient has a only a small area remaining on the tip of her left second toe and a small area remaining on the left heel. The area on the right anterior lower leg is healed. They tell me that she had remote podiatric surgery on the toes of the left foot however they are very fixed in position and I wonder whether they are making it difficult to relieve pressure in her foot wear. She is a type II diabetic with neuropathy as well. 08/01/2020 upon evaluation today patient appears to be doing well currently in regard to her wounds. In fact everything appears to be doing much better. The posterior aspect of her heel is actually giving her some trouble not the Achilles area but a small wound that we did not even have marked. In fact I had noted this before. Nonetheless we are going to addressed that today. 5/27; most of the patient's wounds are healed including the left Achilles heel left toe. Right forearm is also healed. She has a new abrasion posteriorly in the right elbow area just above the olecranon this happened today with an abrasion from her car door 6/15; the patient's wounds on the left Achilles and left second toe remain healed. The area on the right forearm is also just about healed in fact the skin I replaced last time is looking viable which is good. She had me look at the left heel again which is not in the area of the wound more towards the tip of the heel at the Achilles insertion site as well as the tip of the left foot second toe. In the Achilles area that is clearly is friction probably in  bed at night. Not really near where the wound was. The left second toe remains healed although this is hammered and overhanging first toe also puts pressure in this area 09/13/2020 upon evaluation today patient appears to be doing well with regard to all previous wounds that she had. Everything is completely healed. With that being said I do think that the patient unfortunately has a new skin tear on the right forearm which is due to her put a Band-Aid on it and then when it pulled off this caused some issues with skin tearing. Nonetheless I think that this needs to be addressed today. Hopefully  will take too long to get this to heal. She is having some issues with pain in her heel but I think this is more related to be honest to her neuropathy as opposed to anything else. Readmission: 10/30/2020 upon evaluation today patient appears to be doing poorly in regard to her legs bilaterally. She is also having an issue with her fourth toe on the left. Fortunately there does not appear to be any signs of active infection at this time. No fevers, chills, nausea, vomiting, or diarrhea. It is actually been quite a while since we have seen her. In fact June 24 was the last time that she was here in the clinic. She tells me she did not come back because things were just doing "pretty well". 11/29/2020 upon evaluation today patient actually appears to be doing well with regard to her arms those are completely healed. Her right leg is still open but doing okay. The fourth toe of the left foot does show some signs here of having some issues with necrotic tissue of an open wound. This is where a toenail was removed by podiatry. Subsequently I do think that we need to go ahead and see what we can do about getting this cleared away so being try to get some healing going forward. 12/13/2020 upon evaluation today patient appears to be doing decently well in regard to her wound on the right leg. In general I think that she  is making good progress here. Unfortunately she continues to have significant issues with swelling in the left leg is now even more swollen at this point. For that reason I do believe she would be benefited by wrapping both sides although she is not interested in doing this. Overall I think that she is definitely making some good progress here nonetheless in regard to the right leg left leg leave some to be desired. 12/27/2020 upon evaluation today patient appears to be doing well with regard to her wound in regard to the legs. Unfortunately the toe was not doing nearly as well. I am much more concerned about infection and osteomyelitis here. She had the MRI but right now were not seeing obvious evidence of redness spreading up to her foot this is good news I do not have the result of the MRI as of yet. I am good to place her on doxycycline however due to the fact that the wound does seem to be getting a little worse in regard to her toe. 01/14/2021 upon evaluation today patient appears to be doing well with regard to her ulcers. The right leg is doing well and even the toe seems to be doing well though she still has some evidence of bone exposed on the toe which does not appear to be doing quite as good as I would like to see just and the fact that is still not covered over new tissue but looks tremendously better compared to 2 weeks ago. Overall very pleased in that regard. 01/28/2021 upon evaluation today patient's leg on the left actually appears to have new wounds this is due to her deciding to shave yesterday. Fortunately there does not appear to be any signs of infection currently. This is good news. Nonetheless I do believe that with these new areas open it would be best to wrap her leg to try to make sure we get this under control sooner rather than later. With regard to her toe there still does [name], Dawn B. ([id number]) appear to be some evidence  of bone exposed this was covered over with  callus I did remove that today. Nonetheless I think this is significantly smaller than previous. 02/11/2021 upon evaluation today patient appears to be doing okay in regard to her leg ulcers. I do not see anything obvious at this point this seems to be a complicating factor. I think that she is getting better. Were wrap of the right leg not the left leg. With that being said the left fourth toe ulcer still does have evidence of being open there is some drainage still on top of the wound I think we can to try to see about using something like a collagen dressing today to see if that would be of benefit for her. She is in agreement with giving this a trial. Nonetheless I am concerned about the fact that she continues to have issues here with this not healing it is confirmed chronic osteomyelitis she has been on antibiotics. I am going to extend those today as well. That is Keflex and that will be for 1 additional month. Nonetheless if she is not doing significantly better by the next time I see her I think that the biggest thing is going to be for [place] to go ahead and see what we can do about getting her started with hyperbaric oxygen therapy to try to save the toe. 02/20/2021 upon evaluation today patient presents for follow-up she actually came in a little bit early as the home health nurse was concerned about her toe becoming "gangrenous". With that being said the good news is there is [place] no evidence of this and in fact the toe seems to be doing better at this point. What was over the toe was an area that had bled significantly and then subsequently dried making it look much worse than where things actually stand. There is just a very small opening remaining in the toe. In regard to her legs everything appears to be healed bilaterally. 02/24/2021 upon evaluation today patient appears to be doing well with regard to her toe ulcer. I am actually very pleased with where we stand and I think that she is  making good progress at this time. There is still a very small opening although I think the biggest issue is the collagen seems to be getting stuck and drying up because the wound is so small we probably just need to use something to keep this moist like a little piece of Xeroform gauze and see if we get this to close. 03/07/2021 upon evaluation patient actually appears to be making excellent progress in regard to her last wound on the toe. There is just a very tiny potential area that I can even be sure at first until I get some of the dry skin off when I did get this removed there was just a very tiny pinpoint opening that was barely even moist. Actually feel like this is very close to complete resolution. She has been using the Xeroform that is doing great. Objective Constitutional Well-nourished and well-hydrated in no acute distress. Vitals Time Taken: 2:08 PM, Height: 65.5 in, Weight: 240 lbs, BMI: 39.3, Temperature: 97.1 F, Pulse: 84 bpm, Respiratory Rate: 16 breaths/min, Blood Pressure: 135/77 mmHg. Respiratory normal breathing without difficulty. Psychiatric this patient is able to make decisions and demonstrates good insight into disease process. Alert and Oriented x 3. pleasant and cooperative. General Notes: Patient's wound again showed signs of being all but healed today and seems to be doing great I do not  see any signs of infection and overall I think we are on the right track. Integumentary (Hair, Skin) Wound #16 status is Open. Original cause of wound was Pressure Injury. The date acquired was: 10/31/2020. The wound has been in treatment 14 weeks. The wound is located on the Left,Distal Toe Fourth. The wound measures 0.1cm length x 0.1cm width x 0.1cm depth; 0.008cm^2 area and 0.001cm^3 volume. There is bone and Fat Layer (Subcutaneous Tissue) exposed. There is no tunneling or undermining noted. There is a none present amount of drainage noted. The wound margin is thickened.  There is no granulation within the wound bed. There is a large (67- 100%) amount of necrotic tissue within the wound bed including Eschar. 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 ([id number]) Plan Follow-up Appointments: Return Appointment in 2 weeks. Nurse Visit as needed Home Health: [place]: - [place] for wound care. May utilize formulary equivalent dressing for wound treatment orders unless otherwise specified. Home Health Nurse may visit PRN to address patient s wound care needs. Scheduled days for dressing changes to be completed; exception, patient has scheduled wound care visit that day. **Please direct any NON-WOUND related issues/requests for orders to patient's Primary Care Physician. **If current dressing causes regression in wound condition, may D/C ordered dressing product/s and apply Normal Saline Moist Dressing daily until next [place] or Other MD appointment. **Notify [facility] of regression in wound condition at [phone]. Bathing/ Shower/ Hygiene: May shower with wound dressing protected with water repellent cover or cast protector. No tub bath. Other: - Be careful of new skin to protect-do not shave Anesthetic (Use 'Patient Medications' Section for Anesthetic Order Entry): Lidocaine applied to wound bed Edema Control - Lymphedema / Segmental Compressive Device / Other: Patient to wear own compression stockings. Remove compression stockings every night before going to bed  and put on every morning when getting up. Elevate, Exercise Daily and Avoid Standing for Long Periods of Time. Elevate legs to the level of the heart and pump ankles as often as possible Elevate leg(s) parallel to the floor when sitting. DO YOUR BEST to sleep in the bed at night. DO NOT sleep in your recliner. Long hours of sitting in a recliner leads to swelling of the legs and/or potential wounds on your backside. Off-Loading: Wound #16 Left,Distal Toe Fourth: Open toe surgical shoe - keep pressure off wound/toes Other: - suggest float heels when in bed with a pillow to keep pressure off of heels; choose shoewear for right foot to not rub foot Additional Orders / Instructions: Follow Nutritious Diet and Increase Protein Intake Other: - Leave Vaseline gauze over steri strips to right forearm skin tear noted prior to visit; keep protected and allow to fall off and protect skin WOUND #16: - Toe Fourth Wound Laterality: Left, Distal Cleanser: Soap and Water (Home Health) (Generic) 3 x Per Week/15 Days Discharge Instructions: Gently cleanse wound with antibacterial soap, rinse and pat dry prior to dressing wounds Cleanser: Wound Cleanser (Home Health) (Generic) 3 x Per Week/15 Days Discharge Instructions: Wash your hands with soap and water.  Remove old dressing, discard into plastic bag and place into trash. Cleanse the wound with Wound Cleanser prior to applying a clean dressing using gauze sponges, not tissues or cotton balls. Do not scrub or use excessive force. Pat dry using gauze sponges, not tissue or cotton balls. Primary Dressing: Xeroform-HBD 2x2 (in/in) 3 x Per Week/15 Days Discharge Instructions: Apply Xeroform-HBD 2x2 (in/in) as directed Add-Ons: Coverlet Latex-Free Fabric Adhesive Dressings (Home Health) (Generic) 3 x Per Week/15 Days Discharge Instructions: Elastic Knuckle Bandage 1. Would recommend currently that going continue with the wound care measures as before and the  patient is in agreement with plan. This includes the use of the Xeroform to the toe by the next time I see her this is likely can to be completely healed. We will get a see her next week but she could not be seen then due to the holidays and something else going on therefore we can see her in 2 weeks. 2. I am also can recommend that we have the patient continue with the dressing changes every couple of days I think that is appropriate. We will see patient back for reevaluation in 2 weeks here in the clinic. If anything worsens or changes patient will contact our office for additional recommendations. Electronic Signature(s) Signed: 03/07/2021 5:21:27 PM By: Worthy Keeler PA-C Entered By: Worthy Keeler on 03/07/2021 17:21:27 [name], [name] ([id number]) -------------------------------------------------------------------------------- SuperBill Details Patient Name: [name] Date of Service: 03/07/2021 Medical Record Number: [id number] Patient Account Number: [id number] Date of Birth/Sex: [birth year] (70 y.o. F) Treating RN: Donnamarie Poag Primary Care Provider: [name], Olivia Mackie Other Clinician: Referring Provider: [name], Olivia Mackie Treating Provider/Extender: Skipper Cliche in Treatment: 36 Diagnosis Coding ICD-10 Codes Code Description E11.621 Type 2 diabetes mellitus with foot ulcer M86.572 Other chronic hematogenous osteomyelitis, left ankle and foot L97.522 Non-pressure chronic ulcer of other part of left foot with fat layer exposed I87.2 Venous insufficiency (chronic) (peripheral) L97.812 Non-pressure chronic ulcer of other part of right lower leg with fat layer exposed L97.822 Non-pressure chronic ulcer of other part of left lower leg with fat layer exposed Z79.01 Long term (current) use of anticoagulants Facility Procedures The patient participates with Medicare or their insurance follows the Medicare Facility Guidelines: CPT4 Code Description Modifier  Quantity [id number] [phone] - WOUND CARE VISIT-LEV 2 EST PT 1 Physician Procedures CPT4 Code: [id number] Description: 82800 - WC PHYS LEVEL 3 - EST PT Modifier: Quantity: 1 CPT4 Code: Description: ICD-10 Diagnosis Description E11.621 Type 2 diabetes mellitus with foot ulcer M86.572 Other chronic hematogenous osteomyelitis, left ankle and foot L97.522 Non-pressure chronic ulcer of other part of left foot with fat layer ex I87.2 Venous  insufficiency (chronic) (peripheral) Modifier: posed Quantity: Electronic Signature(s) Signed: 03/07/2021 5:21:46 PM By: Worthy Keeler PA-C Previous Signature: 03/07/2021 4:30:01 PM Version By: Donnamarie Poag Entered By: Worthy Keeler on 03/07/2021 17:21:46

## 2021-03-10 DIAGNOSIS — I69320 Aphasia following cerebral infarction: Secondary | ICD-10-CM | POA: Diagnosis not present

## 2021-03-10 DIAGNOSIS — I152 Hypertension secondary to endocrine disorders: Secondary | ICD-10-CM | POA: Diagnosis not present

## 2021-03-10 DIAGNOSIS — I4819 Other persistent atrial fibrillation: Secondary | ICD-10-CM | POA: Diagnosis not present

## 2021-03-10 DIAGNOSIS — J449 Chronic obstructive pulmonary disease, unspecified: Secondary | ICD-10-CM | POA: Diagnosis not present

## 2021-03-10 DIAGNOSIS — E1142 Type 2 diabetes mellitus with diabetic polyneuropathy: Secondary | ICD-10-CM | POA: Diagnosis not present

## 2021-03-10 DIAGNOSIS — I69319 Unspecified symptoms and signs involving cognitive functions following cerebral infarction: Secondary | ICD-10-CM | POA: Diagnosis not present

## 2021-03-10 DIAGNOSIS — I872 Venous insufficiency (chronic) (peripheral): Secondary | ICD-10-CM | POA: Diagnosis not present

## 2021-03-10 DIAGNOSIS — G8194 Hemiplegia, unspecified affecting left nondominant side: Secondary | ICD-10-CM | POA: Diagnosis not present

## 2021-03-10 DIAGNOSIS — E1152 Type 2 diabetes mellitus with diabetic peripheral angiopathy with gangrene: Secondary | ICD-10-CM | POA: Diagnosis not present

## 2021-03-10 DIAGNOSIS — E1159 Type 2 diabetes mellitus with other circulatory complications: Secondary | ICD-10-CM | POA: Diagnosis not present

## 2021-03-10 NOTE — Progress Notes (Deleted)
[facility]   Subjective:     Chief Complaint: No chief complaint on file.  History of Present Illness: [name] is a 70 y.o. female with stress incontinence who presents for a pessary check. She is using a size #0 incontinence ring pessary. The pessary has been working well and she has no complaints. She {ACTION; IS/IS NOI:37048889} using vaginal estrogen. She denies vaginal bleeding.  Previously interested in PTNS  Past Medical History: Patient  has a past medical history of Abnormal antibody titer, Anxiety and depression, Arthritis, Asthma, Atypical chest pain, Carotid arterial disease ([place]), Cystocele, Depression, Diabetes ([place]), DVT (deep venous thrombosis) ([place]), Dyspnea, Edema, GERD (gastroesophageal reflux disease), Heart murmur, History of IBS, History of kidney stones, History of methicillin resistant staphylococcus aureus (MRSA) (2008), History of multiple strokes, Hyperlipidemia, Hypertension, Hypothyroidism, Insomnia, Kidney stone, Neuropathy involving both lower extremities, Non-healing ulcer of left foot ([place]), Obesity, Class II, BMI 35-39.9, with comorbidity, PAF (paroxysmal atrial fibrillation) ([place]), Peripheral vascular disease ([place]), Sleep apnea, Urinary incontinence, and Venous stasis dermatitis of both lower extremities.   Past Surgical History: She  has a past surgical history that includes Ankle surgery (Right); Vaginal hysterectomy; Sleeve Gastroplasty; Lithotripsy; Kidney surgery (Right); transthoracic echocardiogram (08/03/2013); transesophageal echocardiogram (08/08/2013); Tonsillectomy; Total knee arthroplasty (Left); Hiatal hernia repair; Cholecystectomy; I & D extremity (Left, 06/27/2015); Application if wound vac (Left, 06/27/2015); removal of left hematoma (Left); NM GATED MYOVIEW [facility] HX) (February 2017); Reverse shoulder arthroplasty (Right, 10/08/2015); Hernia repair; Ulnar nerve transposition (Right, 08/06/2016); arm surgery; Irrigation and  debridement abscess (Left, 09/07/2016); Incision and drainage of wound (Left, 09/25/2016); Application if wound vac (Left, 09/25/2016); Colonoscopy; Upper gi endoscopy; Esophagogastroduodenoscopy (egd) with propofol (N/A, 01/11/2017); Colonoscopy with propofol (N/A, 01/11/2017); Colonoscopy with propofol (N/A, 02/22/2017); OTHER SURGICAL HISTORY; Total shoulder replacement (Right); Joint replacement (2013); Colonoscopy with propofol (N/A, 05/31/2017); Lower Extremity Angiography (Left, 04/04/2018); Esophagogastroduodenoscopy (egd) with propofol (N/A, 10/25/2018); Incision and drainage of wound (Left, 11/09/2018); and Esophagogastroduodenoscopy (egd) with propofol (N/A, 11/29/2019).   Medications: She has a current medication list which includes the following prescription(s): acetaminophen, albuterol, alprazolam, eliquis, bupropion, calcium-vitamin d, cephalexin, vitamin d3, clobetasol cream, colchicine, collagenase, diclofenac sodium, doxycycline, duloxetine, ergocalciferol, estradiol, toviaz, fluconazole, advair hfa, folic acid, furosemide, hydrocodone-acetaminophen, [START ON 03/13/2021] hydrocodone-acetaminophen, [START ON 04/12/2021] hydrocodone-acetaminophen, levocetirizine, levothyroxine, losartan, metformin, metoprolol succinate, tub transfer board, montelukast, multivitamin with minerals, mupirocin ointment, ondansetron, oxybutynin, pantoprazole, pregabalin, rosuvastatin, sitagliptin, sucralfate, thiamine, and cyanocobalamin.   Allergies: Patient is allergic to morphine and related, penicillins, ambien [zolpidem], aspirin, and oxytetracycline.   Social History: Patient  reports that she has never smoked. She has never used smokeless tobacco. She reports that she does not drink alcohol and does not use drugs.      Objective:    Physical Exam: There were no vitals taken for this visit. Gen: No apparent distress, A&O x 3. Detailed Urogynecologic Evaluation:  Pelvic Exam: Normal external female genitalia;  Bartholin's and Skene's glands normal in appearance; urethral meatus {urethra:24773}, no urethral masses or discharge. The pessary was noted to be {in place:24774}. It was removed and cleaned. Speculum exam revealed {vaginal lesions:24775} in the vagina. The pessary was replaced. It was comfortable to the patient and fit well.   No flowsheet data found.  Laboratory Results: Urine dipstick shows: {ua dip:315374::"negative for all components"}.    Assessment/Plan:    Assessment: [name] is a 70 y.o. with {PFD symptoms:24771} here for a pessary check. She is doing well.  Plan: She will {pessary plan:24776}. She will  continue to use {lubricant:24777}. She will follow-up in *** {days/wks/mos/yrs:310907} for a pessary check or sooner as needed.  All questions were answered.   Time Spent:

## 2021-03-11 ENCOUNTER — Ambulatory Visit: Payer: Medicare HMO | Admitting: Obstetrics and Gynecology

## 2021-03-11 DIAGNOSIS — I872 Venous insufficiency (chronic) (peripheral): Secondary | ICD-10-CM | POA: Diagnosis not present

## 2021-03-11 DIAGNOSIS — I69319 Unspecified symptoms and signs involving cognitive functions following cerebral infarction: Secondary | ICD-10-CM | POA: Diagnosis not present

## 2021-03-11 DIAGNOSIS — I4819 Other persistent atrial fibrillation: Secondary | ICD-10-CM | POA: Diagnosis not present

## 2021-03-11 DIAGNOSIS — E1152 Type 2 diabetes mellitus with diabetic peripheral angiopathy with gangrene: Secondary | ICD-10-CM | POA: Diagnosis not present

## 2021-03-11 DIAGNOSIS — J449 Chronic obstructive pulmonary disease, unspecified: Secondary | ICD-10-CM | POA: Diagnosis not present

## 2021-03-11 DIAGNOSIS — E1142 Type 2 diabetes mellitus with diabetic polyneuropathy: Secondary | ICD-10-CM | POA: Diagnosis not present

## 2021-03-11 DIAGNOSIS — I152 Hypertension secondary to endocrine disorders: Secondary | ICD-10-CM | POA: Diagnosis not present

## 2021-03-11 DIAGNOSIS — E1159 Type 2 diabetes mellitus with other circulatory complications: Secondary | ICD-10-CM | POA: Diagnosis not present

## 2021-03-11 DIAGNOSIS — I69320 Aphasia following cerebral infarction: Secondary | ICD-10-CM | POA: Diagnosis not present

## 2021-03-11 DIAGNOSIS — G8194 Hemiplegia, unspecified affecting left nondominant side: Secondary | ICD-10-CM | POA: Diagnosis not present

## 2021-03-13 DIAGNOSIS — G4733 Obstructive sleep apnea (adult) (pediatric): Secondary | ICD-10-CM | POA: Diagnosis not present

## 2021-03-14 ENCOUNTER — Other Ambulatory Visit: Payer: Self-pay

## 2021-03-14 MED ORDER — GEMTESA 75 MG PO TABS: 75.0000 mg | ORAL_TABLET | Freq: Every day | ORAL | 5 refills | Status: DC

## 2021-03-14 NOTE — Progress Notes (Signed)
Pt called and said [name] is working great! Pt was notified we [facility]

## 2021-03-18 ENCOUNTER — Other Ambulatory Visit: Payer: Self-pay | Admitting: Family

## 2021-03-18 ENCOUNTER — Telehealth: Payer: Self-pay | Admitting: Internal Medicine

## 2021-03-18 DIAGNOSIS — M797 Fibromyalgia: Secondary | ICD-10-CM

## 2021-03-18 DIAGNOSIS — E1142 Type 2 diabetes mellitus with diabetic polyneuropathy: Secondary | ICD-10-CM

## 2021-03-18 NOTE — Telephone Encounter (Signed)
Dr. Dossie Arbour are you ready to take over the lyrica and gabapentin for your chronic pain patients?

## 2021-03-18 NOTE — Progress Notes (Unsigned)
Submitted PA for Gemtesa 75mg  on Cover my Meds. Key: [id number] Rx #: [id number]  Status: Approved  Prior Auth: Coverage Start Date:03/23/2020; Coverage End Date:03/22/2021

## 2021-03-19 DIAGNOSIS — I872 Venous insufficiency (chronic) (peripheral): Secondary | ICD-10-CM | POA: Diagnosis not present

## 2021-03-19 DIAGNOSIS — E1152 Type 2 diabetes mellitus with diabetic peripheral angiopathy with gangrene: Secondary | ICD-10-CM | POA: Diagnosis not present

## 2021-03-19 DIAGNOSIS — E1159 Type 2 diabetes mellitus with other circulatory complications: Secondary | ICD-10-CM | POA: Diagnosis not present

## 2021-03-19 DIAGNOSIS — I69319 Unspecified symptoms and signs involving cognitive functions following cerebral infarction: Secondary | ICD-10-CM | POA: Diagnosis not present

## 2021-03-19 DIAGNOSIS — I4819 Other persistent atrial fibrillation: Secondary | ICD-10-CM | POA: Diagnosis not present

## 2021-03-19 DIAGNOSIS — I152 Hypertension secondary to endocrine disorders: Secondary | ICD-10-CM | POA: Diagnosis not present

## 2021-03-19 DIAGNOSIS — I69320 Aphasia following cerebral infarction: Secondary | ICD-10-CM | POA: Diagnosis not present

## 2021-03-19 DIAGNOSIS — E1142 Type 2 diabetes mellitus with diabetic polyneuropathy: Secondary | ICD-10-CM | POA: Diagnosis not present

## 2021-03-19 DIAGNOSIS — J449 Chronic obstructive pulmonary disease, unspecified: Secondary | ICD-10-CM | POA: Diagnosis not present

## 2021-03-19 DIAGNOSIS — G8194 Hemiplegia, unspecified affecting left nondominant side: Secondary | ICD-10-CM | POA: Diagnosis not present

## 2021-03-20 ENCOUNTER — Encounter: Payer: Self-pay | Admitting: Adult Health

## 2021-03-20 ENCOUNTER — Other Ambulatory Visit: Payer: Self-pay

## 2021-03-20 ENCOUNTER — Ambulatory Visit (INDEPENDENT_AMBULATORY_CARE_PROVIDER_SITE_OTHER): Payer: Medicare HMO | Admitting: Adult Health

## 2021-03-20 VITALS — BP 130/70 | HR 60 | Temp 97.7°F | Ht 65.5 in | Wt 240.0 lb

## 2021-03-20 DIAGNOSIS — G4733 Obstructive sleep apnea (adult) (pediatric): Secondary | ICD-10-CM

## 2021-03-20 DIAGNOSIS — K219 Gastro-esophageal reflux disease without esophagitis: Secondary | ICD-10-CM | POA: Diagnosis not present

## 2021-03-20 DIAGNOSIS — J4540 Moderate persistent asthma, uncomplicated: Secondary | ICD-10-CM | POA: Diagnosis not present

## 2021-03-20 MED ORDER — AZITHROMYCIN 250 MG PO TABS: ORAL_TABLET | ORAL | 0 refills | Status: AC

## 2021-03-20 MED ORDER — BENZONATATE 200 MG PO CAPS: 200.0000 mg | ORAL_CAPSULE | Freq: Three times a day (TID) | ORAL | 1 refills | Status: DC | PRN

## 2021-03-20 MED ORDER — PREDNISONE 20 MG PO TABS: 20.0000 mg | ORAL_TABLET | Freq: Every day | ORAL | 0 refills | Status: DC

## 2021-03-20 MED ORDER — FLUCONAZOLE 150 MG PO TABS: 150.0000 mg | ORAL_TABLET | Freq: Every day | ORAL | 0 refills | Status: DC

## 2021-03-20 NOTE — Assessment & Plan Note (Signed)
Excellent control and compliance on CPAP  Needs new CPAP machine   Plan  . Patient Instructions  Continue on CPAP At bedtime   Work on healthy weight  Do not drive if sleepy .  Order for new CPAP machine   Chest xray today  Zpack take as directed.  Delsym 2 tsp Twice daily  for cough As needed   Prednisone 20mg  daily for 5 days  Tessalon Three times a day  for cough As needed   Restart Xyzal At bedtime  .  Begin Pepcid 20mg  At bedtime  .  Continue on Advair .  Albuterol inhaler .As needed    Follow up with Dr. Patsey Berthold in 4 weeks and As needed   Please contact office for sooner follow up if symptoms do not improve or worsen or seek emergency care

## 2021-03-20 NOTE — Patient Instructions (Addendum)
Continue on CPAP At bedtime   Work on healthy weight  Do not drive if sleepy .  Order for new CPAP machine   Chest xray today  Zpack take as directed.  Delsym 2 tsp Twice daily  for cough As needed   Prednisone 20mg  daily for 5 days  Tessalon Three times a day  for cough As needed   Restart Xyzal At bedtime  .  Begin Pepcid 20mg  At bedtime  .  Continue on Advair .  Albuterol inhaler .As needed    Follow up with Dr. Patsey Berthold in 4 weeks and As needed   Please contact office for sooner follow up if symptoms do not improve or worsen or seek emergency care

## 2021-03-20 NOTE — Assessment & Plan Note (Addendum)
Asthmatic bronchitis flare  Check CXR  Treat triggers  Requests Diflucan due to frequent yeast with abx.   Plan  Patient Instructions  Continue on CPAP At bedtime   Work on healthy weight  Do not drive if sleepy .  Order for new CPAP machine   Chest xray today  Zpack take as directed.  Delsym 2 tsp Twice daily  for cough As needed   Prednisone 20mg  daily for 5 days  Tessalon Three times a day  for cough As needed   Restart Xyzal At bedtime  .  Begin Pepcid 20mg  At bedtime  .  Continue on Advair .  Albuterol inhaler .As needed    Follow up with Dr. Patsey Berthold in 4 weeks and As needed   Please contact office for sooner follow up if symptoms do not improve or worsen or seek emergency care

## 2021-03-20 NOTE — Assessment & Plan Note (Signed)
Cont on GERD treatment  Add Pepcid At bedtime    Plan  Patient Instructions  Continue on CPAP At bedtime   Work on healthy weight  Do not drive if sleepy .  Order for new CPAP machine   Chest xray today  Zpack take as directed.  Delsym 2 tsp Twice daily  for cough As needed   Prednisone 20mg  daily for 5 days  Tessalon Three times a day  for cough As needed   Restart Xyzal At bedtime  .  Begin Pepcid 20mg  At bedtime  .  Continue on Advair .  Albuterol inhaler .As needed    Follow up with Dr. Patsey Berthold in 4 weeks and As needed   Please contact office for sooner follow up if symptoms do not improve or worsen or seek emergency care

## 2021-03-20 NOTE — Progress Notes (Signed)
@Patient  ID: [name], adult    DOB: 1951-02-07, 70 y.o.   MRN: [id number]  Chief Complaint  Patient presents with   Follow-up    Referring provider: [name], Olivia Mackie *  HPI: 70 year old female never smoker  followed for asthmatic bronchitis and chronic cough Medical history significant for sleep apnea on CPAP Medical history significant for diabetes, CVA  Right hemidiaphragm elevation, noted 2017 on xray/CT .   TEST/EVENTS :  PFTs September 22, 2018 FEV1 71%, ratio 77, FVC 69%, no significant bronchodilator response, DLCO 59%.   03/20/2021 Follow up : Asthma  Patient presents for an acute office visit.  She complains of wheezing on/off, worse for last 3 months. Intermittent cough with thick congestion . Yesterday mucus was brown/green . Uses albuterol inhaler or neb daily . Uses flutter valve.  Patient was last seen in the office February 2021 She remains on Advair twice daily and Singulair .  Mild covid infection in June. Fully vaccinated.  Not active , sedentary , left sided weakness from previous stroke. Uses a walker, limits her activity .  On PPI  Swallow issues are chronic , on protonix daily  occasional choking with drinking and eating . , occasional gerd.  Not taking xyzal .   Has OSA on CPAP . Has been on CPAP since age 36 . Uses Dream wear nasal . Doing well on CPAP . Machine is getting old, red light keeps coming on it. Gets supplies through [facility]. CPAP download shows excellent compliance at 100% . Daily average usage at 8hr . AHI 6.2/hr .  CPAP is working well , no significant daytime sleepiness . Feels she benefits from it.   Allergies  Allergen Reactions   Morphine And Related Other (See Comments) and Nausea Only    Patient becomes very confused Other reaction(s): Other (See Comments), Other (See Comments) Patient becomes very confused Patient becomes very confused    Penicillins Hives, Shortness Of Breath, Swelling and Other (See Comments)     Facial swelling Has patient had a PCN reaction causing immediate rash, facial/tongue/throat swelling, SOB or lightheadedness with hypotension: Yes Has patient had a PCN reaction causing severe rash involving mucus membranes or skin necrosis: Yes Has patient had a PCN reaction that required hospitalization No Has patient had a PCN reaction occurring within the last 10 years: No If all of the above answers are "NO", then may proceed with Cephalosporin use.  Other reaction(s): Other (See Comments), Other (See Comments) Facial swelling Has patient had a PCN reaction causing immediate rash, facial/tongue/throat swelling, SOB or lightheadedness with hypotension: Yes Has patient had a PCN reaction causing severe rash involving mucus membranes or skin necrosis: Yes Has patient had a PCN reaction that required hospitalization No Has patient had a PCN reaction occurring within the last 10 years: No If all of the above answers are "NO", then may proceed with Cephalosporin use. Facial swelling Has patient had a PCN reaction causing immediate rash, facial/tongue/throat swelling, SOB or lightheadedness with hypotension: Yes Has patient had a PCN reaction causing severe rash involving mucus membranes or skin necrosis: Yes Has patient had a PCN reaction that required hospitalization No Has patient had a PCN reaction occurring within the last 10 years: No If all of the above answers are "NO", then may proceed with Cephalosporin use. Facial swelling Has patient had a PCN reaction causing immediate rash, facial/tongue/throat swelling, SOB or lightheadedness with hypotension: Yes Has patient had a PCN reaction causing severe rash involving mucus  membranes or skin necrosis: Yes Has patient had a PCN reaction that required hospitalization No Has patient had a PCN reaction occurring within the last 10 years: No If all of the above answers are "NO", then may proceed with Cephalosporin use.    Ambien [Zolpidem]      Hallucination    Aspirin Hives   Oxytetracycline Hives    Other reaction(s): Unknown     Immunization History  Administered Date(s) Administered   Fluad Quad(high Dose 65+) 02/07/2020, 02/06/2021   Influenza, High Dose Seasonal PF 11/28/2014, 02/02/2018, 01/31/2019, 02/20/2019   Influenza, Seasonal, Injecte, Preservative Fre 01/17/2011   Influenza,inj,Quad PF,6+ Mos 12/18/2013   Influenza-Unspecified 12/18/2013, 12/31/2015, 11/27/2016, 02/02/2018   PFIZER(Purple Top)SARS-COV-2 Vaccination 06/29/2019, 07/29/2019, 01/22/2020   Pneumococcal Conjugate-13 11/27/2016   Pneumococcal Polysaccharide-23 01/17/2011, 12/17/2014   Tdap 03/09/2018    Past Medical History:  Diagnosis Date   Abnormal antibody titer    Anxiety and depression    Arthritis    Asthma    Atypical chest pain    a. 03/2018 MV: No ischemia/infarct. EF >65%.   Carotid arterial disease ([place])    a. 05/863 U/S: RICA <78, LICA nl.   Cystocele    Depression    Diabetes ([place])    DVT (deep venous thrombosis) ([place])    Righ calf   Dyspnea    11/08/2018 - "more now due to lack of exercise"   Edema    GERD (gastroesophageal reflux disease)    Heart murmur    a. 08/2013 Echo: EF 60-65%, Mild MR (poss veg seen on TTE, not seen on TEE), nl LV size with mod conc LVH. Mild LAE; b. 08/2019 Echo: EF 60-65%, no rwma. Nl RV size/fxn. No significant valvular dzs.   History of IBS    History of kidney stones    History of methicillin resistant staphylococcus aureus (MRSA) 2008   History of multiple strokes    Hyperlipidemia    Hypertension    Hypothyroidism    Insomnia    Kidney stone    kidney stones with lithotripsy .  [facility]- "adrenal glands"   Neuropathy involving both lower extremities    Non-healing ulcer of left foot (HCC)    Obesity, Class II, BMI 35-39.9, with comorbidity    PAF (paroxysmal atrial fibrillation) ([place])    a. 07/2013 Event monitor: Freq PVCs and 3 short runs of Afib-->CHA2DS2VASc = 7-->Eliquis.    Peripheral vascular disease ([place])    a. 03/2018 LE Angio: Nl renal arteries. Nl aorta & iliacs. Nl LLE arterial flow; b. 06/2020 ABI's:   Sleep apnea    use C-PAP   Urinary incontinence    Venous stasis dermatitis of both lower extremities     Tobacco History: Social History   Tobacco Use  Smoking Status Never  Smokeless Tobacco Never   Counseling given: Not Answered   Outpatient Medications Prior to Visit  Medication Sig Dispense Refill   acetaminophen (TYLENOL) 325 MG tablet Take 2 tablets (650 mg total) by mouth every 6 (six) hours as needed for mild pain or moderate pain (or Fever >/= 101).     albuterol (PROVENTIL) (2.5 MG/3ML) 0.083% nebulizer solution Take 3 mLs (2.5 mg total) by nebulization every 4 (four) hours as needed for wheezing or shortness of breath. 75 mL 12   ALPRAZolam (XANAX) 0.5 MG tablet TAKE 1/2 TABLET BY MOUTH TWICE DAILY AS NEEDED FOR ANXIETY 60 tablet 5   apixaban (ELIQUIS) 5 MG TABS tablet TAKE 1 TABLET BY MOUTH  TWICE DAILY 180 tablet 1   buPROPion (WELLBUTRIN XL) 150 MG 24 hr tablet Take 1 tablet (150 mg total) by mouth daily. 90 tablet 3   calcium-vitamin D (OSCAL WITH D) 500-200 MG-UNIT tablet Take 1 tablet by mouth 2 (two) times daily with a meal. 60 tablet 0   cholecalciferol (VITAMIN D) 25 MCG tablet Take 1 tablet (1,000 Units total) by mouth 2 (two) times daily with a meal. 30 tablet 2   clobetasol cream (TEMOVATE) 1.58 % Apply 1 application topically 2 (two) times daily as needed (skin irritation).      colchicine 0.6 MG tablet Take 1 tablet (0.6 mg total) by mouth as needed. May repeat 1 pill in 1 hr. No more than 3 total pills 1.8 mg in 24 hours as needed gout flare 30 tablet 2   collagenase (SANTYL) ointment Apply 1 application topically at bedtime. X7-10 days 30 g 0   diclofenac Sodium (VOLTAREN) 1 % GEL Apply 2 g topically 3 (three) times daily. 100 g 2   DULoxetine (CYMBALTA) 60 MG capsule Take 1 capsule (60 mg total) by mouth daily. 90 capsule  3   ergocalciferol (VITAMIN D2) 1.25 MG (50000 UT) capsule Take 50,000 Units by mouth once a week.     estradiol (ESTRACE) 0.1 MG/GM vaginal cream Place 1 Applicatorful vaginally every Monday, Wednesday, and Friday. Apply 0.5mg  (pea-sized amount)  just inside the vaginal introitus with a finger-tip on Monday, Wednesday and Friday nights, 42.5 g 11   fluconazole (DIFLUCAN) 150 MG tablet Take by mouth daily.     fluticasone-salmeterol (ADVAIR HFA) 115-21 MCG/ACT inhaler Inhale 2 puffs into the lungs daily. Rinse mouth thoroughly after use 1 each 12   folic acid (FOLVITE) 1 MG tablet Take 1 tablet (1 mg total) by mouth daily. 90 tablet 3   furosemide (LASIX) 20 MG tablet TAKE ONE TABLET BY MOUTH EVERY DAY 30 tablet 0   HYDROcodone-acetaminophen (NORCO/VICODIN) 5-325 MG tablet Take 1 tablet by mouth 2 (two) times daily as needed for severe pain. Must last 30 days 45 tablet 0   [START ON 04/12/2021] HYDROcodone-acetaminophen (NORCO/VICODIN) 5-325 MG tablet Take 1 tablet by mouth 2 (two) times daily as needed for severe pain. Must last 30 days 45 tablet 0   levocetirizine (XYZAL) 5 MG tablet Take 1 tablet (5 mg total) by mouth at bedtime as needed for allergies. 90 tablet 3   levothyroxine (SYNTHROID) 150 MCG tablet Take 1 tablet (150 mcg total) by mouth daily before breakfast. D/c prior sig 90 tablet 3   losartan (COZAAR) 50 MG tablet Take 1 tablet (50 mg total) by mouth daily. D/c losartan 50-12.5 90 tablet 3   metFORMIN (GLUCOPHAGE) 500 MG tablet Take 1 tablet (500 mg total) by mouth daily with breakfast. With food 90 tablet 3   metoprolol succinate (TOPROL XL) 25 MG 24 hr tablet Take 0.5 tablets (12.5 mg total) by mouth daily. 45 tablet 0   Misc. Devices (TUB TRANSFER BOARD) MISC 1 Device by Does not apply route as needed. 1 each 0   montelukast (SINGULAIR) 10 MG tablet Take 1 tablet (10 mg total) by mouth at bedtime. 90 tablet 3   Multiple Vitamin (MULTIVITAMIN WITH MINERALS) TABS tablet Take 1 tablet  by mouth daily.     mupirocin ointment (BACTROBAN) 2 % Apply 1 application topically 2 (two) times daily. Right lower leg 30 g 2   ondansetron (ZOFRAN) 4 MG tablet Take 1 tablet (4 mg total) by mouth every 8 (eight)  hours as needed for nausea or vomiting. 90 tablet 1   pantoprazole (PROTONIX) 40 MG tablet Take 1 tablet (40 mg total) by mouth daily. 30 min before food 90 tablet 3   pregabalin (LYRICA) 50 MG capsule TAKE 1 CAPSULE (50 MG) BY MOUTH 3 TIMES DAILY 90 capsule 0   rosuvastatin (CRESTOR) 20 MG tablet TAKE 1 TABLET BY  MOUTH ONCE DAILY 30 tablet 5   sitaGLIPtin (JANUVIA) 50 MG tablet Take 1 tablet (50 mg total) by mouth daily. 90 tablet 3   sucralfate (CARAFATE) 1 GM/10ML suspension Take 1 g by mouth 3 (three) times daily.      thiamine 100 MG tablet Take 0.5 tablets (50 mg total) by mouth 2 (two) times daily with a meal. 60 tablet 2   Vibegron (GEMTESA) 75 MG TABS Take 75 mg by mouth daily. 30 tablet 5   vitamin B-12 1000 MCG tablet Take 1 tablet (1,000 mcg total) by mouth daily. 30 tablet 2   cephALEXin (KEFLEX) 500 MG capsule Take 500 mg by mouth 3 (three) times daily.     doxycycline (VIBRA-TABS) 100 MG tablet Take 1 tablet (100 mg total) by mouth 2 (two) times daily. With food 20 tablet 0   HYDROcodone-acetaminophen (NORCO/VICODIN) 5-325 MG tablet Take 1 tablet by mouth 2 (two) times daily as needed for severe pain. Must last 30 days 45 tablet 0   No facility-administered medications prior to visit.     Review of Systems:   Constitutional:   No  weight loss, night sweats,  Fevers, chills,  +fatigue, or  lassitude.  HEENT:   No headaches,  Difficulty swallowing,  Tooth/dental problems, or  Sore throat,                No sneezing, itching, ear ache,  +nasal congestion, post nasal drip,   CV:  No chest pain,  Orthopnea, PND, swelling in lower extremities, anasarca, dizziness, palpitations, syncope.   GI  No heartburn, indigestion, abdominal pain, nausea, vomiting, diarrhea,  change in bowel habits, loss of appetite, bloody stools.   Resp:   No chest wall deformity  Skin: no rash or lesions.  GU: no dysuria, change in color of urine, no urgency or frequency.  No flank pain, no hematuria   MS:  No joint pain or swelling.  No decreased range of motion.  No back pain.    Physical Exam  BP 130/70 (BP Location: Left Arm, Patient Position: Sitting, Cuff Size: Normal)    Pulse 60    Temp 97.7 F (36.5 C) (Oral)    Ht 5' 5.5" (1.664 m)    Wt 240 lb (108.9 kg)    SpO2 100%    BMI 39.33 kg/m   GEN: A/Ox3; pleasant , NAD, well nourished , wc   HEENT:  [place]/AT,  EACs-clear, TMs-wnl, NOSE-clear, THROAT-clear, no lesions, no postnasal drip or exudate noted. Class 2-3 MP airway   NECK:  Supple w/ fair ROM; no JVD; normal carotid impulses w/o bruits; no thyromegaly or nodules palpated; no lymphadenopathy.    RESP  few trace exp wheezing on forced expiration,   no accessory muscle use, no dullness to percussion  CARD:  RRR, no m/r/g, tr  peripheral edema, pulses intact, no cyanosis or clubbing.  GI:   Soft & nt; nml bowel sounds; no organomegaly or masses detected.   Musco: Warm bil, no deformities or joint swelling noted.   Neuro: alert, no focal deficits noted.    Skin: Warm, no lesions  or rashes    Lab Results:   BMET   BNP   ProBNP No results found for: PROBNP  Imaging: No results found.    No flowsheet data found.  No results found for: NITRICOXIDE      Assessment & Plan:   OSA (obstructive sleep apnea) Excellent control and compliance on CPAP  Needs new CPAP machine   Plan  . Patient Instructions  Continue on CPAP At bedtime   Work on healthy weight  Do not drive if sleepy .  Order for new CPAP machine   Chest xray today  Zpack take as directed.  Delsym 2 tsp Twice daily  for cough As needed   Prednisone 20mg  daily for 5 days  Tessalon Three times a day  for cough As needed   Restart Xyzal At bedtime  .  Begin Pepcid  20mg  At bedtime  .  Continue on Advair .  Albuterol inhaler .As needed    Follow up with Dr. Patsey Berthold in 4 weeks and As needed   Please contact office for sooner follow up if symptoms do not improve or worsen or seek emergency care         Asthma, well controlled Asthmatic bronchitis flare  Check CXR  Treat triggers  Requests Diflucan due to frequent yeast with abx.   Plan  Patient Instructions  Continue on CPAP At bedtime   Work on healthy weight  Do not drive if sleepy .  Order for new CPAP machine   Chest xray today  Zpack take as directed.  Delsym 2 tsp Twice daily  for cough As needed   Prednisone 20mg  daily for 5 days  Tessalon Three times a day  for cough As needed   Restart Xyzal At bedtime  .  Begin Pepcid 20mg  At bedtime  .  Continue on Advair .  Albuterol inhaler .As needed    Follow up with Dr. Patsey Berthold in 4 weeks and As needed   Please contact office for sooner follow up if symptoms do not improve or worsen or seek emergency care         Gastro-esophageal reflux disease without esophagitis Cont on GERD treatment  Add Pepcid At bedtime    Plan  Patient Instructions  Continue on CPAP At bedtime   Work on healthy weight  Do not drive if sleepy .  Order for new CPAP machine   Chest xray today  Zpack take as directed.  Delsym 2 tsp Twice daily  for cough As needed   Prednisone 20mg  daily for 5 days  Tessalon Three times a day  for cough As needed   Restart Xyzal At bedtime  .  Begin Pepcid 20mg  At bedtime  .  Continue on Advair .  Albuterol inhaler .As needed    Follow up with Dr. Patsey Berthold in 4 weeks and As needed   Please contact office for sooner follow up if symptoms do not improve or worsen or seek emergency care           Rexene Edison, NP 03/20/2021

## 2021-03-21 DIAGNOSIS — I152 Hypertension secondary to endocrine disorders: Secondary | ICD-10-CM | POA: Diagnosis not present

## 2021-03-21 DIAGNOSIS — I69319 Unspecified symptoms and signs involving cognitive functions following cerebral infarction: Secondary | ICD-10-CM | POA: Diagnosis not present

## 2021-03-21 DIAGNOSIS — E1142 Type 2 diabetes mellitus with diabetic polyneuropathy: Secondary | ICD-10-CM | POA: Diagnosis not present

## 2021-03-21 DIAGNOSIS — I4819 Other persistent atrial fibrillation: Secondary | ICD-10-CM | POA: Diagnosis not present

## 2021-03-21 DIAGNOSIS — I69320 Aphasia following cerebral infarction: Secondary | ICD-10-CM | POA: Diagnosis not present

## 2021-03-21 DIAGNOSIS — I872 Venous insufficiency (chronic) (peripheral): Secondary | ICD-10-CM | POA: Diagnosis not present

## 2021-03-21 DIAGNOSIS — G8194 Hemiplegia, unspecified affecting left nondominant side: Secondary | ICD-10-CM | POA: Diagnosis not present

## 2021-03-21 DIAGNOSIS — J449 Chronic obstructive pulmonary disease, unspecified: Secondary | ICD-10-CM | POA: Diagnosis not present

## 2021-03-21 DIAGNOSIS — E1152 Type 2 diabetes mellitus with diabetic peripheral angiopathy with gangrene: Secondary | ICD-10-CM | POA: Diagnosis not present

## 2021-03-21 DIAGNOSIS — E1159 Type 2 diabetes mellitus with other circulatory complications: Secondary | ICD-10-CM | POA: Diagnosis not present

## 2021-03-25 ENCOUNTER — Other Ambulatory Visit: Payer: Self-pay

## 2021-03-25 ENCOUNTER — Inpatient Hospital Stay: Payer: Medicare HMO

## 2021-03-25 ENCOUNTER — Inpatient Hospital Stay: Payer: Medicare HMO | Attending: Oncology | Admitting: Oncology

## 2021-03-25 ENCOUNTER — Encounter: Payer: Self-pay | Admitting: Oncology

## 2021-03-25 ENCOUNTER — Encounter: Payer: Medicare HMO | Attending: Physician Assistant | Admitting: Physician Assistant

## 2021-03-25 VITALS — BP 150/81 | HR 65 | Temp 96.0°F | Resp 20 | Wt 270.0 lb

## 2021-03-25 DIAGNOSIS — I739 Peripheral vascular disease, unspecified: Secondary | ICD-10-CM | POA: Insufficient documentation

## 2021-03-25 DIAGNOSIS — G4730 Sleep apnea, unspecified: Secondary | ICD-10-CM | POA: Insufficient documentation

## 2021-03-25 DIAGNOSIS — L97822 Non-pressure chronic ulcer of other part of left lower leg with fat layer exposed: Secondary | ICD-10-CM | POA: Insufficient documentation

## 2021-03-25 DIAGNOSIS — D649 Anemia, unspecified: Secondary | ICD-10-CM | POA: Diagnosis not present

## 2021-03-25 DIAGNOSIS — E119 Type 2 diabetes mellitus without complications: Secondary | ICD-10-CM | POA: Insufficient documentation

## 2021-03-25 DIAGNOSIS — Z8673 Personal history of transient ischemic attack (TIA), and cerebral infarction without residual deficits: Secondary | ICD-10-CM | POA: Diagnosis not present

## 2021-03-25 DIAGNOSIS — G8929 Other chronic pain: Secondary | ICD-10-CM | POA: Insufficient documentation

## 2021-03-25 DIAGNOSIS — Z7901 Long term (current) use of anticoagulants: Secondary | ICD-10-CM | POA: Diagnosis not present

## 2021-03-25 DIAGNOSIS — E538 Deficiency of other specified B group vitamins: Secondary | ICD-10-CM | POA: Diagnosis not present

## 2021-03-25 DIAGNOSIS — E11621 Type 2 diabetes mellitus with foot ulcer: Secondary | ICD-10-CM | POA: Diagnosis not present

## 2021-03-25 DIAGNOSIS — L97522 Non-pressure chronic ulcer of other part of left foot with fat layer exposed: Secondary | ICD-10-CM | POA: Diagnosis not present

## 2021-03-25 DIAGNOSIS — M25512 Pain in left shoulder: Secondary | ICD-10-CM | POA: Diagnosis not present

## 2021-03-25 DIAGNOSIS — E785 Hyperlipidemia, unspecified: Secondary | ICD-10-CM | POA: Insufficient documentation

## 2021-03-25 DIAGNOSIS — L97812 Non-pressure chronic ulcer of other part of right lower leg with fat layer exposed: Secondary | ICD-10-CM | POA: Diagnosis not present

## 2021-03-25 DIAGNOSIS — L89899 Pressure ulcer of other site, unspecified stage: Secondary | ICD-10-CM | POA: Diagnosis not present

## 2021-03-25 DIAGNOSIS — M25511 Pain in right shoulder: Secondary | ICD-10-CM | POA: Insufficient documentation

## 2021-03-25 DIAGNOSIS — M86572 Other chronic hematogenous osteomyelitis, left ankle and foot: Secondary | ICD-10-CM | POA: Diagnosis not present

## 2021-03-25 DIAGNOSIS — I872 Venous insufficiency (chronic) (peripheral): Secondary | ICD-10-CM | POA: Insufficient documentation

## 2021-03-25 LAB — RETICULOCYTES
Immature Retic Fract: 35.7 % — ABNORMAL HIGH (ref 2.3–15.9)
RBC.: 3.72 MIL/uL — ABNORMAL LOW (ref 3.87–5.11)
Retic Count, Absolute: 74.4 10*3/uL (ref 19.0–186.0)
Retic Ct Pct: 2 % (ref 0.4–3.1)

## 2021-03-25 LAB — CBC WITH DIFFERENTIAL/PLATELET
Abs Immature Granulocytes: 0.08 10*3/uL — ABNORMAL HIGH (ref 0.00–0.07)
Basophils Absolute: 0 10*3/uL (ref 0.0–0.1)
Basophils Relative: 1 %
Eosinophils Absolute: 0.3 10*3/uL (ref 0.0–0.5)
Eosinophils Relative: 4 %
HCT: 29.6 % — ABNORMAL LOW (ref 36.0–46.0)
Hemoglobin: 8.6 g/dL — ABNORMAL LOW (ref 12.0–15.0)
Immature Granulocytes: 1 %
Lymphocytes Relative: 13 %
Lymphs Abs: 1 10*3/uL (ref 0.7–4.0)
MCH: 22.7 pg — ABNORMAL LOW (ref 26.0–34.0)
MCHC: 29.1 g/dL — ABNORMAL LOW (ref 30.0–36.0)
MCV: 78.1 fL — ABNORMAL LOW (ref 80.0–100.0)
Monocytes Absolute: 0.6 10*3/uL (ref 0.1–1.0)
Monocytes Relative: 8 %
Neutro Abs: 5.6 10*3/uL (ref 1.7–7.7)
Neutrophils Relative %: 73 %
Platelets: 239 10*3/uL (ref 150–400)
RBC: 3.79 MIL/uL — ABNORMAL LOW (ref 3.87–5.11)
RDW: 17.8 % — ABNORMAL HIGH (ref 11.5–15.5)
WBC: 7.6 10*3/uL (ref 4.0–10.5)
nRBC: 0 % (ref 0.0–0.2)

## 2021-03-25 LAB — COMPREHENSIVE METABOLIC PANEL
ALT: 9 U/L (ref 0–44)
AST: 14 U/L — ABNORMAL LOW (ref 15–41)
Albumin: 3.2 g/dL — ABNORMAL LOW (ref 3.5–5.0)
Alkaline Phosphatase: 94 U/L (ref 38–126)
Anion gap: 10 (ref 5–15)
BUN: 23 mg/dL (ref 8–23)
CO2: 28 mmol/L (ref 22–32)
Calcium: 8.5 mg/dL — ABNORMAL LOW (ref 8.9–10.3)
Chloride: 99 mmol/L (ref 98–111)
Creatinine, Ser: 1.19 mg/dL — ABNORMAL HIGH (ref 0.44–1.00)
GFR, Estimated: 49 mL/min — ABNORMAL LOW (ref >60)
Glucose, Bld: 154 mg/dL — ABNORMAL HIGH (ref 70–99)
Potassium: 4.7 mmol/L (ref 3.5–5.1)
Sodium: 137 mmol/L (ref 135–145)
Total Bilirubin: 0.3 mg/dL (ref 0.3–1.2)
Total Protein: 6.6 g/dL (ref 6.5–8.1)

## 2021-03-25 LAB — TSH: TSH: 0.142 u[IU]/mL — ABNORMAL LOW (ref 0.350–4.500)

## 2021-03-25 LAB — FERRITIN: Ferritin: 6 ng/mL — ABNORMAL LOW (ref 11–307)

## 2021-03-25 LAB — VITAMIN B12: Vitamin B-12: 563 pg/mL (ref 180–914)

## 2021-03-25 LAB — FOLATE: Folate: 46 ng/mL (ref >5.9)

## 2021-03-25 NOTE — Progress Notes (Addendum)
[name] ([id number]) Visit Report for 03/25/2021 Chief Complaint Document Details Patient Name: [name] Date of Service: 03/25/2021 3:15 PM Medical Record Number: [id number] Patient Account Number: [id number] Date of Birth/Sex: 14-Nov-1950 (71 y.o. F) Treating RN: Donnamarie Poag Primary Care Provider: [name], Olivia Mackie Other Clinician: Referring Provider: [name], Olivia Mackie Treating Provider/Extender: Skipper Cliche in Treatment: 54 Information Obtained from: Patient Chief Complaint Right Leg Ulcer and Right 4thToe Ulcer Electronic Signature(s) Signed: 03/25/2021 4:02:58 PM By: Dawn Keeler PA-C Entered By: [name] on 03/25/2021 16:02:57 [name] ([id number]) -------------------------------------------------------------------------------- HPI Details Patient Name: [name] Date of Service: 03/25/2021 3:15 PM Medical Record Number: [id number] Patient Account Number: [id number] Date of Birth/Sex: April 15, 1950 (70 y.o. F) Treating RN: Donnamarie Poag Primary Care Provider: [name], Olivia Mackie Other Clinician: Referring Provider: [name], Olivia Mackie Treating Provider/Extender: Skipper Cliche in Treatment: 71 History of Present Illness HPI Description: 06/28/20 upon evaluation today patient appears to be doing unfortunately somewhat poorly in regard to wounds that she has over her lower extremities. She has a wound on the left distal second toe, left posterior heel, and right anterior lower leg. With that being said all in all none of the wounds appear to be too bad the one that hurts her the most is actually the wound on the posterior heel which actually is also one of the smallest. Nonetheless I think there is some callus hiding what is really going on in this area. Fortunately there does not appear to be any obvious evidence of infection which is great news. Patient does have a history of diabetes mellitus type 2, chronic venous  insufficiency, hypertension, history of DVTs, and is on long-term anticoagulant therapy, Eliquis. 07/05/20 upon evaluation today patient appears to be doing well currently with regard to her wounds. I feel like she is making progress which is great news and overall there is no signs of active infection at this time. No fevers, chills, nausea, vomiting, or diarrhea. 4/29; patient has a only a small area remaining on the tip of her left second toe and a small area remaining on the left heel. The area on the right anterior lower leg is healed. They tell me that she had remote podiatric surgery on the toes of the left foot however they are very fixed in position and I wonder whether they are making it difficult to relieve pressure in her foot wear. She is a type II diabetic with neuropathy as well. 08/01/2020 upon evaluation today patient appears to be doing well currently in regard to her wounds. In fact everything appears to be doing much better. The posterior aspect of her heel is actually giving her some trouble not the Achilles area but a small wound that we did not even have marked. In fact I had noted this before. Nonetheless we are going to addressed that today. 5/27; most of the patient's wounds are healed including the left Achilles heel left toe. Right forearm is also healed. She has a new abrasion posteriorly in the right elbow area just above the olecranon this happened today with an abrasion from her car door 6/15; the patient's wounds on the left Achilles and left second toe remain healed. The area on the right forearm is also just about healed in fact the skin I replaced last time is looking viable which is good. She had me look at the left heel again which is not in the area of the wound more towards the tip of the heel at  the Achilles insertion site as well as the tip of the left foot second toe. In the Achilles area that is clearly is friction probably in bed at night. Not really near  where the wound was. The left second toe remains healed although this is hammered and overhanging first toe also puts pressure in this area 09/13/2020 upon evaluation today patient appears to be doing well with regard to all previous wounds that she had. Everything is completely healed. With that being said I do think that the patient unfortunately has a new skin tear on the right forearm which is due to her put a Band-Aid on it and then when it pulled off this caused some issues with skin tearing. Nonetheless I think that this needs to be addressed today. Hopefully will take too long to get this to heal. She is having some issues with pain in her heel but I think this is more related to be honest to her neuropathy as opposed to anything else. Readmission: 10/30/2020 upon evaluation today patient appears to be doing poorly in regard to her legs bilaterally. She is also having an issue with her fourth toe on the left. Fortunately there does not appear to be any signs of active infection at this time. No fevers, chills, nausea, vomiting, or diarrhea. It is actually been quite a while since we have seen her. In fact June 24 was the last time that she was here in the clinic. She tells me she did not come back because things were just doing "pretty well". 11/29/2020 upon evaluation today patient actually appears to be doing well with regard to her arms those are completely healed. Her right leg is still open but doing okay. The fourth toe of the left foot does show some signs here of having some issues with necrotic tissue of an open wound. This is where a toenail was removed by podiatry. Subsequently I do think that we need to go ahead and see what we can do about getting this cleared away so being try to get some healing going forward. 12/13/2020 upon evaluation today patient appears to be doing decently well in regard to her wound on the right leg. In general I think that she is making good progress here.  Unfortunately she continues to have significant issues with swelling in the left leg is now even more swollen at this point. For that reason I do believe she would be benefited by wrapping both sides although she is not interested in doing this. Overall I think that she is definitely making some good progress here nonetheless in regard to the right leg left leg leave some to be desired. 12/27/2020 upon evaluation today patient appears to be doing well with regard to her wound in regard to the legs. Unfortunately the toe was not doing nearly as well. I am much more concerned about infection and osteomyelitis here. She had the MRI but right now were not seeing obvious evidence of redness spreading up to her foot this is good news I do not have the result of the MRI as of yet. I am good to place her on doxycycline however due to the fact that the wound does seem to be getting a little worse in regard to her toe. 01/14/2021 upon evaluation today patient appears to be doing well with regard to her ulcers. The right leg is doing well and even the toe seems to be doing well though she still has some evidence of bone exposed on  the toe which does not appear to be doing quite as good as I would like to see just and the fact that is still not covered over new tissue but looks tremendously better compared to 2 weeks ago. Overall very pleased in that regard. 01/28/2021 upon evaluation today patient's leg on the left actually appears to have new wounds this is due to her deciding to shave yesterday. Fortunately there does not appear to be any signs of infection currently. This is good news. Nonetheless I do believe that with these new areas open it would be best to wrap her leg to try to make sure we get this under control sooner rather than later. With regard to her toe there still does appear to be some evidence of bone exposed this was covered over with callus I did remove that today. Nonetheless I think this is  significantly smaller than previous. 02/11/2021 upon evaluation today patient appears to be doing okay in regard to her leg ulcers. I do not see anything obvious at this point this seems to be a complicating factor. I think that she is getting better. Were wrap of the right leg not the left leg. With that being said the left fourth Vaughan, Yuna B. ([id number]) toe ulcer still does have evidence of being open there is some drainage still on top of the wound I think we can to try to see about using something like a collagen dressing today to see if that would be of benefit for her. She is in agreement with giving this a trial. Nonetheless I am concerned about the fact that she continues to have issues here with this not healing it is confirmed chronic osteomyelitis she has been on antibiotics. I am going to extend those today as well. That is Keflex and that will be for 1 additional month. Nonetheless if she is not doing significantly better by the next time I see her I think that the biggest thing is going to be for [place] to go ahead and see what we can do about getting her started with hyperbaric oxygen therapy to try to save the toe. 02/20/2021 upon evaluation today patient presents for follow-up she actually came in a little bit early as the home health nurse was concerned about her toe becoming "gangrenous". With that being said the good news is there is [place] no evidence of this and in fact the toe seems to be doing better at this point. What was over the toe was an area that had bled significantly and then subsequently dried making it look much worse than where things actually stand. There is just a very small opening remaining in the toe. In regard to her legs everything appears to be healed bilaterally. 02/24/2021 upon evaluation today patient appears to be doing well with regard to her toe ulcer. I am actually very pleased with where we stand and I think that she is making good progress at this  time. There is still a very small opening although I think the biggest issue is the collagen seems to be getting stuck and drying up because the wound is so small we probably just need to use something to keep this moist like a little piece of Xeroform gauze and see if we get this to close. 03/07/2021 upon evaluation patient actually appears to be making excellent progress in regard to her last wound on the toe. There is just a very tiny potential area that I can even be sure at first  until I get some of the dry skin off when I did get this removed there was just a very tiny pinpoint opening that was barely even moist. Actually feel like this is very close to complete resolution. She has been using the Xeroform that is doing great. 03/25/2021 upon evaluation today patient appears to be doing well with regard to her toe ulcer. In fact this appears to be potentially completely healed. Fortunately I do not see anything draining at this point which is great news. She does have a small blood blister that is already drying up and looking okay on the third toe as well I am not sure what happened here she has an either. Otherwise she is having some weeping from the right leg this again is newer she tells me from last week weighing in that her pulmonologist and then today weighing in at the hematologist that she gained 30 pounds. This is quite significant if indeed true and I think she is to contact her primary care provider ASAP. This would account for why her right leg is weeping as well. Electronic Signature(s) Signed: 03/25/2021 4:25:35 PM By: Dawn Keeler PA-C Entered By: [name] on 03/25/2021 16:25:35 CATALEYA, CRISTINA ([id number]) -------------------------------------------------------------------------------- Physical Exam Details Patient Name: [name] Date of Service: 03/25/2021 3:15 PM Medical Record Number: [id number] Patient Account Number: [id number] Date of Birth/Sex:  [birth year] (70 y.o. F) Treating RN: Donnamarie Poag Primary Care Provider: [name], Olivia Mackie Other Clinician: Referring Provider: [name], Olivia Mackie Treating Provider/Extender: Skipper Cliche in Treatment: 67 Constitutional Obese and well-hydrated in no acute distress. Respiratory normal breathing without difficulty. Psychiatric this patient is able to make decisions and demonstrates good insight into disease process. Alert and Oriented x 3. pleasant and cooperative. Notes Upon inspection patient's wound bed actually showed signs of good granulation and epithelization at this point in regard to the toes I feel like she is actually doing quite well and this is very close to complete resolution if not already resolved although [place] probably watch it for at least 1 more week in that regard. Otherwise she seems to be doing decently well other than the fluid overload which seems to be quite significant and that she feels that she is gained 30 pounds in the past 2 weeks based on 2 different weights of 2 different doctors offices. Electronic Signature(s) Signed: 03/25/2021 4:26:06 PM By: Dawn Keeler PA-C Entered By: [name] on 03/25/2021 16:26:06 DRAVEN, LAINE ([id number]) -------------------------------------------------------------------------------- Physician Orders Details Patient Name: [name] Date of Service: 03/25/2021 3:15 PM Medical Record Number: [id number] Patient Account Number: [id number] Date of Birth/Sex: May 25, 1950 (70 y.o. F) Treating RN: Donnamarie Poag Primary Care Provider: [name], Olivia Mackie Other Clinician: Referring Provider: [name], Olivia Mackie Treating Provider/Extender: Skipper Cliche in Treatment: 38 Verbal / Phone Orders: No Diagnosis Coding ICD-10 Coding Code Description E11.621 Type 2 diabetes mellitus with foot ulcer M86.572 Other chronic hematogenous osteomyelitis, left ankle and foot L97.522 Non-pressure chronic ulcer of  other part of left foot with fat layer exposed I87.2 Venous insufficiency (chronic) (peripheral) L97.812 Non-pressure chronic ulcer of other part of right lower leg with fat layer exposed L97.822 Non-pressure chronic ulcer of other part of left lower leg with fat layer exposed Z79.01 Long term (current) use of anticoagulants Follow-up Appointments o Return Appointment in 1 week. o Nurse Visit as needed [place] for wound care. May utilize formulary equivalent dressing for wound treatment orders  unless otherwise specified. Home Health Nurse may visit PRN to address patientos wound care needs. o Scheduled days for dressing changes to be completed; exception, patient has scheduled wound care visit that day. o **Please direct any NON-WOUND related issues/requests for orders to patient's Primary Care Physician. **If current dressing causes regression in wound condition, may D/C ordered dressing product/s and apply Normal Saline Moist Dressing daily until next [place] or Other MD appointment. **Notify [facility] of regression in wound condition at [phone]. Bathing/ Shower/ Hygiene o May shower with wound dressing protected with water repellent cover or cast protector. o No tub bath. o Other: - Be careful of new skin to protect-do not shave Anesthetic (Use 'Patient Medications' Section for Anesthetic Order Entry) o Lidocaine applied to wound bed Edema Control - Lymphedema / Segmental Compressive Device / Other o Patient to wear own compression stockings. Remove compression stockings every night before going to bed and put on every morning when getting up. o Elevate, Exercise Daily and Avoid Standing for Long Periods of Time. o Elevate legs to the level of the heart and pump ankles as often as possible o Elevate leg(s) parallel to the floor when sitting. o DO YOUR BEST to sleep  in the bed at night. DO NOT sleep in your recliner. Long hours of sitting in a recliner leads to swelling of the legs and/or potential wounds on your backside. Non-Wound Condition o Additional non-wound orders/instructions: - apply Betadine to left 2nd toe cover open areas right lower leg with silver alginate and cover with dressing as needed until stops leaking/ABD pad or kerlix CALL PULMONARY OR PCP FOR ORDERS OF GAINING WEIGHT Off-Loading Wound #16 Left,Distal Toe Fourth o Open toe surgical shoe - keep pressure off wound/toes o Other: - suggest float heels when in bed with a pillow to keep pressure off of heels; choose shoewear for right foot to not rub foot Additional Orders / Instructions o Follow Nutritious Diet and Increase Protein Intake o Other: - Leave Vaseline gauze over steri strips to right forearm skin tear noted prior to visit; keep protected and allow to fall off and protect skin [place], Tyleigh B. ([id number]) Wound Treatment Wound #16 - Toe Fourth Wound Laterality: Left, Distal Cleanser: Soap and Water (Home Health) (Generic) 1 x Per Day/15 Days Discharge Instructions: Gently cleanse wound with antibacterial soap, rinse and pat dry prior to dressing wounds Cleanser: Wound Cleanser (Home Health) (Generic) 1 x Per Day/15 Days Discharge Instructions: Wash your hands with soap and water. Remove old dressing, discard into plastic bag and place into trash. Cleanse the wound with Wound Cleanser prior to applying a clean dressing using gauze sponges, not tissues or cotton balls. Do not scrub or use excessive force. Pat dry using gauze sponges, not tissue or cotton balls. Topical: Betadine 1 x Per Day/15 Days Discharge Instructions: paint toes Add-Ons: Coverlet Latex-Free Fabric Adhesive Dressings (Home Health) (Generic) 1 x Per Day/15 Days Discharge Instructions: Elastic Knuckle Bandage Electronic Signature(s) Signed: 03/25/2021 4:33:22 PM By: Donnamarie Poag Signed: 03/25/2021  4:50:46 PM By: Dawn Keeler PA-C Entered By: Donnamarie Poag on 03/25/2021 16:21:54 Andrades, Joni [name] ([id number]) -------------------------------------------------------------------------------- Problem List Details Patient Name: [name] Date of Service: 03/25/2021 3:15 PM Medical Record Number: [id number] Patient Account Number: [id number] Date of Birth/Sex: 1950-10-17 (70 y.o. F) Treating RN: Donnamarie Poag Primary Care Provider: [name], Olivia Mackie Other Clinician: Referring Provider: [name], Olivia Mackie Treating Provider/Extender: Skipper Cliche in Treatment: 63 Active Problems ICD-10 Encounter Code Description Active  Date MDM Diagnosis E11.621 Type 2 diabetes mellitus with foot ulcer 06/28/2020 No Yes M86.572 Other chronic hematogenous osteomyelitis, left ankle and foot 02/11/2021 No Yes L97.522 Non-pressure chronic ulcer of other part of left foot with fat layer 06/28/2020 No Yes exposed I87.2 Venous insufficiency (chronic) (peripheral) 06/28/2020 No Yes L97.812 Non-pressure chronic ulcer of other part of right lower leg with fat layer 06/28/2020 No Yes exposed L97.822 Non-pressure chronic ulcer of other part of left lower leg with fat layer 02/11/2021 No Yes exposed Z79.01 Long term (current) use of anticoagulants 06/28/2020 No Yes Inactive Problems ICD-10 Code Description Active Date Inactive Date I10 Essential (primary) hypertension 06/28/2020 06/28/2020 Z86.718 Personal history of other venous thrombosis and embolism 06/28/2020 06/28/2020 Resolved Problems ICD-10 Code Description Active Date Resolved Date L97.322 Non-pressure chronic ulcer of left ankle with fat layer exposed 06/28/2020 06/28/2020 S50.811A Abrasion of right forearm, initial encounter 10/30/2020 10/30/2020 JERZI, TIGERT ([id number]) S50.311D Abrasion of right elbow, subsequent encounter 08/16/2020 08/16/2020 Electronic Signature(s) Signed: 03/25/2021 4:02:53 PM By: Dawn Keeler PA-C Entered By: [name] on 03/25/2021 16:02:52 MAKAIYA, GEERDES ([id number]) -------------------------------------------------------------------------------- Progress Note Details Patient Name: [name] Date of Service: 03/25/2021 3:15 PM Medical Record Number: [id number] Patient Account Number: [id number] Date of Birth/Sex: Jul 21, 1950 (70 y.o. F) Treating RN: Donnamarie Poag Primary Care Provider: [name], Olivia Mackie Other Clinician: Referring Provider: [name], Olivia Mackie Treating Provider/Extender: Skipper Cliche in Treatment: 27 Subjective Chief Complaint Information obtained from Patient Right Leg Ulcer and Right 4thToe Ulcer History of Present Illness (HPI) 06/28/20 upon evaluation today patient appears to be doing unfortunately somewhat poorly in regard to wounds that she has over her lower extremities. She has a wound on the left distal second toe, left posterior heel, and right anterior lower leg. With that being said all in all none of the wounds appear to be too bad the one that hurts her the most is actually the wound on the posterior heel which actually is also one of the smallest. Nonetheless I think there is some callus hiding what is really going on in this area. Fortunately there does not appear to be any obvious evidence of infection which is great news. Patient does have a history of diabetes mellitus type 2, chronic venous insufficiency, hypertension, history of DVTs, and is on long-term anticoagulant therapy, Eliquis. 07/05/20 upon evaluation today patient appears to be doing well currently with regard to her wounds. I feel like she is making progress which is great news and overall there is no signs of active infection at this time. No fevers, chills, nausea, vomiting, or diarrhea. 4/29; patient has a only a small area remaining on the tip of her left second toe and a small area remaining on the left heel. The area on the right anterior lower leg is healed. They tell me that she  had remote podiatric surgery on the toes of the left foot however they are very fixed in position and I wonder whether they are making it difficult to relieve pressure in her foot wear. She is a type II diabetic with neuropathy as well. 08/01/2020 upon evaluation today patient appears to be doing well currently in regard to her wounds. In fact everything appears to be doing much better. The posterior aspect of her heel is actually giving her some trouble not the Achilles area but a small wound that we did not even have marked. In fact I had noted this before. Nonetheless we are going to addressed that today. 5/27; most  of the patient's wounds are healed including the left Achilles heel left toe. Right forearm is also healed. She has a new abrasion posteriorly in the right elbow area just above the olecranon this happened today with an abrasion from her car door 6/15; the patient's wounds on the left Achilles and left second toe remain healed. The area on the right forearm is also just about healed in fact the skin I replaced last time is looking viable which is good. She had me look at the left heel again which is not in the area of the wound more towards the tip of the heel at the Achilles insertion site as well as the tip of the left foot second toe. In the Achilles area that is clearly is friction probably in bed at night. Not really near where the wound was. The left second toe remains healed although this is hammered and overhanging first toe also puts pressure in this area 09/13/2020 upon evaluation today patient appears to be doing well with regard to all previous wounds that she had. Everything is completely healed. With that being said I do think that the patient unfortunately has a new skin tear on the right forearm which is due to her put a Band-Aid on it and then when it pulled off this caused some issues with skin tearing. Nonetheless I think that this needs to be addressed today.  Hopefully will take too long to get this to heal. She is having some issues with pain in her heel but I think this is more related to be honest to her neuropathy as opposed to anything else. Readmission: 10/30/2020 upon evaluation today patient appears to be doing poorly in regard to her legs bilaterally. She is also having an issue with her fourth toe on the left. Fortunately there does not appear to be any signs of active infection at this time. No fevers, chills, nausea, vomiting, or diarrhea. It is actually been quite a while since we have seen her. In fact June 24 was the last time that she was here in the clinic. She tells me she did not come back because things were just doing "pretty well". 11/29/2020 upon evaluation today patient actually appears to be doing well with regard to her arms those are completely healed. Her right leg is still open but doing okay. The fourth toe of the left foot does show some signs here of having some issues with necrotic tissue of an open wound. This is where a toenail was removed by podiatry. Subsequently I do think that we need to go ahead and see what we can do about getting this cleared away so being try to get some healing going forward. 12/13/2020 upon evaluation today patient appears to be doing decently well in regard to her wound on the right leg. In general I think that she is making good progress here. Unfortunately she continues to have significant issues with swelling in the left leg is now even more swollen at this point. For that reason I do believe she would be benefited by wrapping both sides although she is not interested in doing this. Overall I think that she is definitely making some good progress here nonetheless in regard to the right leg left leg leave some to be desired. 12/27/2020 upon evaluation today patient appears to be doing well with regard to her wound in regard to the legs. Unfortunately the toe was not doing nearly as well. I am  much more concerned about  infection and osteomyelitis here. She had the MRI but right now were not seeing obvious evidence of redness spreading up to her foot this is good news I do not have the result of the MRI as of yet. I am good to place her on doxycycline however due to the fact that the wound does seem to be getting a little worse in regard to her toe. 01/14/2021 upon evaluation today patient appears to be doing well with regard to her ulcers. The right leg is doing well and even the toe seems to be doing well though she still has some evidence of bone exposed on the toe which does not appear to be doing quite as good as I would like to see just and the fact that is still not covered over new tissue but looks tremendously better compared to 2 weeks ago. Overall very pleased in that regard. 01/28/2021 upon evaluation today patient's leg on the left actually appears to have new wounds this is due to her deciding to shave yesterday. Fortunately there does not appear to be any signs of infection currently. This is good news. Nonetheless I do believe that with these new areas open it would be best to wrap her leg to try to make sure we get this under control sooner rather than later. With regard to her toe there still does Wolfrey, Lenni B. ([id number]) appear to be some evidence of bone exposed this was covered over with callus I did remove that today. Nonetheless I think this is significantly smaller than previous. 02/11/2021 upon evaluation today patient appears to be doing okay in regard to her leg ulcers. I do not see anything obvious at this point this seems to be a complicating factor. I think that she is getting better. Were wrap of the right leg not the left leg. With that being said the left fourth toe ulcer still does have evidence of being open there is some drainage still on top of the wound I think we can to try to see about using something like a collagen dressing today to see if that  would be of benefit for her. She is in agreement with giving this a trial. Nonetheless I am concerned about the fact that she continues to have issues here with this not healing it is confirmed chronic osteomyelitis she has been on antibiotics. I am going to extend those today as well. That is Keflex and that will be for 1 additional month. Nonetheless if she is not doing significantly better by the next time I see her I think that the biggest thing is going to be for [place] to go ahead and see what we can do about getting her started with hyperbaric oxygen therapy to try to save the toe. 02/20/2021 upon evaluation today patient presents for follow-up she actually came in a little bit early as the home health nurse was concerned about her toe becoming "gangrenous". With that being said the good news is there is [place] no evidence of this and in fact the toe seems to be doing better at this point. What was over the toe was an area that had bled significantly and then subsequently dried making it look much worse than where things actually stand. There is just a very small opening remaining in the toe. In regard to her legs everything appears to be healed bilaterally. 02/24/2021 upon evaluation today patient appears to be doing well with regard to her toe ulcer. I am actually very pleased with  where we stand and I think that she is making good progress at this time. There is still a very small opening although I think the biggest issue is the collagen seems to be getting stuck and drying up because the wound is so small we probably just need to use something to keep this moist like a little piece of Xeroform gauze and see if we get this to close. 03/07/2021 upon evaluation patient actually appears to be making excellent progress in regard to her last wound on the toe. There is just a very tiny potential area that I can even be sure at first until I get some of the dry skin off when I did get this removed there  was just a very tiny pinpoint opening that was barely even moist. Actually feel like this is very close to complete resolution. She has been using the Xeroform that is doing great. 03/25/2021 upon evaluation today patient appears to be doing well with regard to her toe ulcer. In fact this appears to be potentially completely healed. Fortunately I do not see anything draining at this point which is great news. She does have a small blood blister that is already drying up and looking okay on the third toe as well I am not sure what happened here she has an either. Otherwise she is having some weeping from the right leg this again is newer she tells me from last week weighing in that her pulmonologist and then today weighing in at the hematologist that she gained 30 pounds. This is quite significant if indeed true and I think she is to contact her primary care provider ASAP. This would account for why her right leg is weeping as well. Objective Constitutional Obese and well-hydrated in no acute distress. Vitals Time Taken: 3:46 PM, Height: 65.5 in, Weight: 240 lbs, BMI: 39.3, Temperature: 98.7 F, Pulse: 69 bpm, Respiratory Rate: 18 breaths/min, Blood Pressure: 150/74 mmHg. Respiratory normal breathing without difficulty. Psychiatric this patient is able to make decisions and demonstrates good insight into disease process. Alert and Oriented x 3. pleasant and cooperative. General Notes: Upon inspection patient's wound bed actually showed signs of good granulation and epithelization at this point in regard to the toes I feel like she is actually doing quite well and this is very close to complete resolution if not already resolved although [place] probably watch it for at least 1 more week in that regard. Otherwise she seems to be doing decently well other than the fluid overload which seems to be quite significant and that she feels that she is gained 30 pounds in the past 2 weeks based on 2 different  weights of 2 different doctors offices. Integumentary (Hair, Skin) Wound #16 status is Open. Original cause of wound was Pressure Injury. The date acquired was: 10/31/2020. The wound has been in treatment 16 weeks. The wound is located on the Left,Distal Toe Fourth. The wound measures 0.1cm length x 0.1cm width x 0.1cm depth; 0.008cm^2 area and 0.001cm^3 volume. There is bone and Fat Layer (Subcutaneous Tissue) exposed. There is no tunneling or undermining noted. There is a none present amount of drainage noted. The wound margin is thickened. There is no granulation within the wound bed. There is a large (67- 100%) amount of necrotic tissue within the wound bed including Eschar. Other Condition(s) Patient presents with Suspected Deep Tissue Injury located on the Left Foot. Patient presents with Cellulitis located on the Right Leg. 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 Nurse Visit as needed Home Health: [facility]: - [facility] for wound care. May utilize formulary equivalent dressing for wound treatment orders unless otherwise specified. Home Health Nurse may visit PRN to address patient s wound care needs. Scheduled days for dressing changes to be completed; exception, patient has scheduled wound care visit that day. **Please direct any NON-WOUND related issues/requests for orders to patient's Primary Care Physician. **If current dressing causes  regression in wound condition, may D/C ordered dressing product/s and apply Normal Saline Moist Dressing daily until next [place] or Other MD appointment. **Notify [facility] of regression in wound condition at [phone]. Bathing/ Shower/ Hygiene: May shower with wound dressing protected with water repellent cover or cast protector. No tub bath. Other: - Be careful of new skin to protect-do not shave Anesthetic (Use 'Patient Medications' Section for Anesthetic Order Entry): Lidocaine applied to wound bed Edema Control - Lymphedema / Segmental Compressive Device / Other: Patient to wear own compression stockings. Remove compression stockings every night before going to bed and put on every morning when getting up. Elevate, Exercise Daily and Avoid Standing for Long Periods of Time. Elevate legs to the level of the heart and pump ankles as often as possible Elevate leg(s) parallel to the floor when sitting. DO YOUR BEST to sleep in the bed at night. DO NOT sleep in your recliner. Long hours of sitting in a recliner leads to swelling of the legs and/or potential wounds on your backside. Non-Wound Condition: Additional non-wound orders/instructions: - apply Betadine to left 2nd toe cover open areas right lower leg with silver alginate and cover with dressing as needed until stops leaking/ABD pad or kerlix CALL PULMONARY OR PCP FOR ORDERS OF GAINING WEIGHT Off-Loading: Wound #16 Left,Distal Toe Fourth: Open toe surgical shoe - keep pressure off wound/toes Other: - suggest float heels when in bed with a pillow to keep pressure off of heels; choose shoewear for right foot to not rub foot Additional Orders / Instructions: Follow Nutritious Diet and Increase Protein Intake Other: - Leave Vaseline gauze over steri strips to right forearm skin tear noted prior to visit; keep protected and allow to fall off and protect skin WOUND #16: - Toe Fourth Wound Laterality: Left,  Distal Cleanser: Soap and Water (Home Health) (Generic) 1 x Per Day/15 Days Discharge Instructions: Gently cleanse wound with antibacterial soap, rinse and pat dry prior to dressing wounds Cleanser: Wound Cleanser (Home Health) (Generic) 1 x Per Day/15 Days Discharge Instructions: Wash your hands with soap and water. Remove old dressing, discard into plastic bag and place into trash. Cleanse the wound with Wound Cleanser prior to applying a clean dressing using gauze sponges, not tissues or cotton balls. Do not scrub or use excessive force. Pat dry using gauze sponges, not tissue or cotton balls. Topical: Betadine 1 x Per Day/15 Days Discharge Instructions: paint toes Add-Ons: Coverlet Latex-Free Fabric Adhesive Dressings (Home Health) (Generic) 1 x Per Day/15 Days Discharge Instructions: Elastic Knuckle Bandage 1. I would recommend currently that we going to  continue with the wound care measures as before and the patient is in agreement with the plan. This includes the use of the Betadine paints to the third and fourth toes of the left foot. I think this is really all she needs at this point I really feel like both are probably doing quite well and if not completely healed the fourth toe is about there. 2. I am also can recommend that we have the patient continue with the compression stockings currently. Again if she has gained 30 pounds in the past 2 weeks and fluid I do not want to put anything on that would squeeze more fluid into her system in general that would just be a bigger complication. EILYN, POLACK B. ([id number]) 3. Based on what I am seeing currently I do believe that the patient should contact her primary care provider ASAP to see what should be done about the fluid weight gain. This is not something she wants to just let go unchecked. We will see patient back for reevaluation in 1 week here in the clinic. If anything worsens or changes patient will contact our office for  additional recommendations. Electronic Signature(s) Signed: 03/25/2021 4:27:42 PM By: Dawn Keeler PA-C Previous Signature: 03/25/2021 4:26:58 PM Version By: Dawn Keeler PA-C Entered By: [name] on 03/25/2021 16:27:41 Walkins, Joni [name] ([id number]) -------------------------------------------------------------------------------- SuperBill Details Patient Name: [name] Date of Service: 03/25/2021 Medical Record Number: [id number] Patient Account Number: [id number] Date of Birth/Sex: 1950/03/30 (70 y.o. F) Treating RN: Donnamarie Poag Primary Care Provider: [name], Olivia Mackie Other Clinician: Referring Provider: [name], Olivia Mackie Treating Provider/Extender: Skipper Cliche in Treatment: 38 Diagnosis Coding ICD-10 Codes Code Description E11.621 Type 2 diabetes mellitus with foot ulcer M86.572 Other chronic hematogenous osteomyelitis, left ankle and foot L97.522 Non-pressure chronic ulcer of other part of left foot with fat layer exposed I87.2 Venous insufficiency (chronic) (peripheral) L97.812 Non-pressure chronic ulcer of other part of right lower leg with fat layer exposed L97.822 Non-pressure chronic ulcer of other part of left lower leg with fat layer exposed Z79.01 Long term (current) use of anticoagulants Facility Procedures The patient participates with Medicare or their insurance follows the Medicare Facility Guidelines: CPT4 Code Description Modifier Quantity [id number] [phone] - WOUND CARE VISIT-LEV 3 EST PT 1 Physician Procedures CPT4 Code: [id number] Description: 32919 - WC PHYS LEVEL 4 - EST PT Modifier: Quantity: 1 CPT4 Code: Description: ICD-10 Diagnosis Description E11.621 Type 2 diabetes mellitus with foot ulcer M86.572 Other chronic hematogenous osteomyelitis, left ankle and foot L97.522 Non-pressure chronic ulcer of other part of left foot with fat layer ex I87.2 Venous  insufficiency (chronic) (peripheral) Modifier: posed Quantity: Electronic  Signature(s) Signed: 03/25/2021 4:28:01 PM By: Dawn Keeler PA-C Entered By: [name] on 03/25/2021 16:28:00

## 2021-03-25 NOTE — Progress Notes (Signed)
AGAPITA, SAVARINO ([id number]) Visit Report for 03/25/2021 Arrival Information Details Patient Name: CAMIE, HAUSS Date of Service: 03/25/2021 3:15 PM Medical Record Number: [id number] Patient Account Number: [id number] Date of Birth/Sex: 10/31/1950 (71 y.o. F) Treating RN: Donnamarie Poag Primary Care Mayli Covington: [name], Olivia Mackie Other Clinician: Referring Vaniya Augspurger: [name], Olivia Mackie Treating Morgan Rennert/Extender: Skipper Cliche in Treatment: 90 Visit Information History Since Last Visit Added or deleted any medications: No Patient Arrived: Wheel Chair Had a fall or experienced change in No Arrival Time: 15:34 activities of daily living that may affect Accompanied By: husband risk of falls: Transfer Assistance: EasyPivot Patient Lift Hospitalized since last visit: No Patient Identification Verified: Yes Has Dressing in Place as Prescribed: Yes Secondary Verification Process Completed: Yes Pain Present Now: Yes Patient Requires Transmission-Based No Precautions: Patient Has Alerts: Yes Patient Alerts: Patient on Blood Thinner Eliquis Diabetic II 06/2020 ABI L)1.22 R) 1.14 Electronic Signature(s) Signed: 03/25/2021 4:33:22 PM By: Donnamarie Poag Entered By: Donnamarie Poag on 03/25/2021 15:45:36 Mclucas, [name] ([id number]) -------------------------------------------------------------------------------- Clinic Level of Care Assessment Details Patient Name: [name] Date of Service: 03/25/2021 3:15 PM Medical Record Number: [id number] Patient Account Number: [id number] Date of Birth/Sex: 09/05/1950 (70 y.o. F) Treating RN: Donnamarie Poag Primary Care Tressa Maldonado: [name], Olivia Mackie Other Clinician: Referring Garan Frappier: [name], Olivia Mackie Treating Aquil Duhe/Extender: Skipper Cliche in Treatment: 70 Clinic Level of Care Assessment Items TOOL 4 Quantity Score []  - Use when only an EandM is performed on FOLLOW-UP visit 0 ASSESSMENTS - Nursing Assessment /  Reassessment []  - Reassessment of Co-morbidities (includes updates in patient status) 0 []  - 0 Reassessment of Adherence to Treatment Plan ASSESSMENTS - Wound and Skin Assessment / Reassessment X - Simple Wound Assessment / Reassessment - one wound 1 5 []  - 0 Complex Wound Assessment / Reassessment - multiple wounds X- 1 10 Dermatologic / Skin Assessment (not related to wound area) ASSESSMENTS - Focused Assessment []  - Circumferential Edema Measurements - multi extremities 0 []  - 0 Nutritional Assessment / Counseling / Intervention []  - 0 Lower Extremity Assessment (monofilament, tuning fork, pulses) []  - 0 Peripheral Arterial Disease Assessment (using hand held doppler) ASSESSMENTS - Ostomy and/or Continence Assessment and Care []  - Incontinence Assessment and Management 0 []  - 0 Ostomy Care Assessment and Management (repouching, etc.) PROCESS - Coordination of Care X - Simple Patient / Family Education for ongoing care 1 15 []  - 0 Complex (extensive) Patient / Family Education for ongoing care []  - 0 Staff obtains Programmer, systems, Records, Test Results / Process Orders X- 1 10 Staff telephones HHA, Nursing Homes / Clarify orders / etc []  - 0 Routine Transfer to another Facility (non-emergent condition) []  - 0 Routine Hospital Admission (non-emergent condition) []  - 0 New Admissions / Biomedical engineer / Ordering NPWT, Apligraf, etc. []  - 0 Emergency Hospital Admission (emergent condition) X- 1 10 Simple Discharge Coordination []  - 0 Complex (extensive) Discharge Coordination PROCESS - Special Needs []  - Pediatric / Minor Patient Management 0 []  - 0 Isolation Patient Management []  - 0 Hearing / Language / Visual special needs []  - 0 Assessment of Community assistance (transportation, D/C planning, etc.) []  - 0 Additional assistance / Altered mentation []  - 0 Support Surface(s) Assessment (bed, cushion, seat, etc.) INTERVENTIONS - Wound Cleansing /  Measurement Wegener, [place] B. ([id number]) X- 1 5 Simple Wound Cleansing - one wound []  - 0 Complex Wound Cleansing - multiple wounds X- 1 5 Wound Imaging (photographs - any number of wounds) []  - 0 Wound Tracing (instead of  photographs) X- 1 5 Simple Wound Measurement - one wound []  - 0 Complex Wound Measurement - multiple wounds INTERVENTIONS - Wound Dressings X - Small Wound Dressing one or multiple wounds 1 10 []  - 0 Medium Wound Dressing one or multiple wounds []  - 0 Large Wound Dressing one or multiple wounds []  - 0 Application of Medications - topical []  - 0 Application of Medications - injection INTERVENTIONS - Miscellaneous []  - External ear exam 0 []  - 0 Specimen Collection (cultures, biopsies, blood, body fluids, etc.) []  - 0 Specimen(s) / Culture(s) sent or taken to Lab for analysis []  - 0 Patient Transfer (multiple staff / Civil Service fast streamer / Similar devices) []  - 0 Simple Staple / Suture removal (25 or less) []  - 0 Complex Staple / Suture removal (26 or more) []  - 0 Hypo / Hyperglycemic Management (close monitor of Blood Glucose) []  - 0 Ankle / Brachial Index (ABI) - do not check if billed separately X- 1 5 Vital Signs Has the patient been seen at the hospital within the last three years: Yes Total Score: 80 Level Of Care: New/Established - Level 3 Electronic Signature(s) Signed: 03/25/2021 4:33:22 PM By: Donnamarie Poag Entered By: Donnamarie Poag on 03/25/2021 16:22:38 [name] ([id number]) -------------------------------------------------------------------------------- Encounter Discharge Information Details Patient Name: [name] Date of Service: 03/25/2021 3:15 PM Medical Record Number: [id number] Patient Account Number: [id number] Date of Birth/Sex: 1950/03/27 (70 y.o. F) Treating RN: Donnamarie Poag Primary Care Taelyr Jantz: [name], Olivia Mackie Other Clinician: Referring Tykera Skates: [name], Olivia Mackie Treating Tedrick Port/Extender: Skipper Cliche in Treatment: 37 Encounter Discharge Information Items Discharge Condition: Stable Ambulatory Status: Wheelchair Discharge Destination: Home Transportation: Private Auto Accompanied By: husband Schedule Follow-up Appointment: Yes Clinical Summary of Care: Electronic Signature(s) Signed: 03/25/2021 4:33:22 PM By: Donnamarie Poag Entered By: Donnamarie Poag on 03/25/2021 16:25:01 ROBERTSJoni Reining ([id number]) -------------------------------------------------------------------------------- Lower Extremity Assessment Details Patient Name: [name] Date of Service: 03/25/2021 3:15 PM Medical Record Number: [id number] Patient Account Number: [id number] Date of Birth/Sex: 01/27/1951 (70 y.o. F) Treating RN: Donnamarie Poag Primary Care Lache Dagher: [name], Olivia Mackie Other Clinician: Referring Karder Goodin: [name], Olivia Mackie Treating Kean Gautreau/Extender: Jeri Cos Weeks in Treatment: 38 Edema Assessment Assessed: [Left: Yes] [Right: Yes] Edema: [Left: N] [Right: o] Vascular Assessment Pulses: Dorsalis Pedis Palpable: [Left:Yes] [Right:Yes] Electronic Signature(s) Signed: 03/25/2021 4:33:22 PM By: Donnamarie Poag Entered By: Donnamarie Poag on 03/25/2021 15:56:04 Walbert, [name] ([id number]) -------------------------------------------------------------------------------- Multi Wound Chart Details Patient Name: [name] Date of Service: 03/25/2021 3:15 PM Medical Record Number: [id number] Patient Account Number: [id number] Date of Birth/Sex: May 20, 1950 (70 y.o. 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 Comorbid History: Asthma, Pneumothorax, N/A N/A Arrhythmia, Hypertension, Type II Diabetes, Gout, Osteoarthritis, Neuropathy Date Acquired: 10/31/2020 N/A N/A Weeks of Treatment: 16 N/A N/A Wound Status: Open N/A N/A Measurements L x W x D (cm) 0.1x0.1x0.1 N/A N/A Area (cm) : 0.008 N/A N/A Volume (cm) : 0.001 N/A N/A % Reduction in Area: 99.00% N/A N/A % Reduction in Volume: 98.70% N/A N/A Classification: Grade 3 N/A N/A Exudate Amount: None Present N/A N/A Wound Margin: Thickened N/A N/A Granulation Amount: None Present (0%) N/A N/A  Necrotic Amount: Large (67-100%) N/A N/A Necrotic Tissue: Eschar N/A N/A Exposed Structures: Fat Layer (Subcutaneous Tissue): N/A N/A Yes Bone: Yes Fascia: No Tendon: No Muscle: No Joint: No Treatment Notes Electronic Signature(s) Signed: 03/25/2021 4:33:22 PM By: Donnamarie Poag Entered By: Donnamarie Poag on 03/25/2021 16:04:00 [name] ([id number]) -------------------------------------------------------------------------------- [place] Details Patient Name: [name] Date of Service: 03/25/2021 3:15 PM Medical Record Number: [id number] Patient Account Number: [id number] Date of Birth/Sex: 27-Dec-1950 (70 y.o. F) Treating RN: Donnamarie Poag Primary Care Delonda Coley: [name], Olivia Mackie Other Clinician: Referring Emily Massar: [name], Olivia Mackie Treating Reginold Beale/Extender: Skipper Cliche in Treatment: 67 Active Inactive Electronic Signature(s) Signed: 03/25/2021 4:33:22 PM By: Donnamarie Poag Entered By: Donnamarie Poag on 03/25/2021 15:56:27 Kertz, [name] ([id number]) -------------------------------------------------------------------------------- Non-Wound Condition Assessment Details Patient Name: [name] Date of Service: 03/25/2021 3:15 PM Medical Record Number:  [id number] Patient Account Number: [id number] Date of Birth/Sex: 29-Oct-1950 (70 y.o. F) Treating RN: Donnamarie Poag Primary Care Teya Otterson: [name], Olivia Mackie Other Clinician: Referring Jove Beyl: [name], Olivia Mackie Treating Shakenya Stoneberg/Extender: Skipper Cliche in Treatment: 58 Non-Wound Condition: Condition: Suspected Deep Tissue Injury Location: Foot Side: Left Notes: left 2nd toe Photos Electronic Signature(s) Signed: 03/25/2021 4:33:22 PM By: Donnamarie Poag Entered By: Donnamarie Poag on 03/25/2021 15:54:28 Yepiz, [name] ([id number]) -------------------------------------------------------------------------------- Non-Wound Condition Assessment Details Patient Name: [name] Date of Service: 03/25/2021 3:15 PM Medical Record Number: [id number] Patient Account Number: [id number] Date of Birth/Sex: [birth year] (70 y.o. F) Treating RN: Donnamarie Poag Primary Care Mary-Anne Polizzi: [name], Olivia Mackie Other Clinician: Referring Tiffani Kadow: [name], Olivia Mackie Treating Lonna Rabold/Extender: Jeri Cos Weeks in Treatment: 38 Non-Wound Condition: Condition: Cellulitis Location: Leg Side: Right Photos Electronic Signature(s) Signed: 03/25/2021 4:33:22 PM By: Donnamarie Poag Entered By: Donnamarie Poag on 03/25/2021 15:55:23 Marek, [name] ([id number]) -------------------------------------------------------------------------------- Pain Assessment Details Patient Name: [name] Date of Service: 03/25/2021 3:15 PM Medical Record Number: [id number] Patient Account Number: [id number] Date of Birth/Sex: 1951/03/01 (70 y.o. F) Treating RN: Donnamarie Poag Primary Care Zahki Hoogendoorn: [name], Olivia Mackie Other Clinician: Referring Kariana Wiles: [name], Olivia Mackie Treating Baruc Tugwell/Extender: Skipper Cliche in Treatment: 3 Active Problems Location of Pain Severity and Description of Pain Patient Has Paino Yes Site Locations Pain Location: Generalized Pain, Pain in Ulcers Rate the  pain. Current Pain Level: 6 Pain Management and Medication Current Pain Management: Electronic Signature(s) Signed: 03/25/2021 4:33:22 PM By: Donnamarie Poag Entered By: Donnamarie Poag on 03/25/2021 15:46:57 Marchetti, [name] ([id number]) -------------------------------------------------------------------------------- Patient/Caregiver Education Details Patient Name: [name] Date of Service: 03/25/2021 3:15 PM Medical Record Number: [id number] Patient Account Number: [id number] Date of Birth/Gender: 1950/04/13 (70 y.o. F) Treating RN: Donnamarie Poag Primary Care Physician: [name], Olivia Mackie Other Clinician: Referring Physician: [name], Olivia Mackie Treating Physician/Extender: Skipper Cliche in Treatment: 29 Education Assessment Education Provided To: Patient and Caregiver Education Topics Provided Basic Hygiene: Pressure: Wound/Skin Impairment: Engineer, maintenance) Signed: 03/25/2021 4:33:22 PM By: Donnamarie Poag Entered By: Donnamarie Poag on 03/25/2021 16:23:07 [name] ([id number]) -------------------------------------------------------------------------------- Wound Assessment Details Patient Name: [name] Date of Service: 03/25/2021 3:15 PM Medical Record Number: [id number] Patient Account Number: [id number] Date of Birth/Sex: [birth year] (70 y.o. F) Treating RN: Donnamarie Poag Primary Care Dameisha Tschida: [name], Olivia Mackie Other Clinician: Referring Parrish Bonn: [name], Olivia Mackie Treating Brinsley Wence/Extender: Skipper Cliche in Treatment: 38 Wound Status Wound Number: 16 Primary Diabetic Wound/Ulcer of the Lower Extremity Etiology: Wound Location: Left, Distal Toe Fourth Wound Open Wounding Event: Pressure Injury Status: Date Acquired: 10/31/2020 Notes: stated toenail was removed by podiatry doctor on Weeks Of Treatment: 16 11/08/20 Clustered Wound:  No Comorbid Asthma, Pneumothorax, Arrhythmia, Hypertension, Type History: II Diabetes, Gout,  Osteoarthritis, Neuropathy Photos Wound Measurements Length: (cm) 0.1 Width: (cm) 0.1 Depth: (cm) 0.1 Area: (cm) 0.008 Volume: (cm) 0.001 % Reduction in Area: 99% % Reduction in Volume: 98.7% Tunneling: No Undermining: No Wound Description Classification: Grade 3 Wound Margin: Thickened Exudate Amount: None Present Foul Odor After Cleansing: No Slough/Fibrino Yes Wound Bed Granulation Amount: None Present (0%) Exposed Structure Necrotic Amount: Large (67-100%) Fascia Exposed: No Necrotic Quality: Eschar Fat Layer (Subcutaneous Tissue) Exposed: Yes Tendon Exposed: No Muscle Exposed: No Joint Exposed: No Bone Exposed: Yes Treatment Notes Wound #16 (Toe Fourth) Wound Laterality: Left, Distal Cleanser Soap and Water Discharge Instruction: Gently cleanse wound with antibacterial soap, rinse and pat dry prior to dressing wounds Wound Cleanser Riga, Manika B. ([id number]) Discharge Instruction: Wash your hands with soap and water. Remove old dressing, discard into plastic bag and place into trash. Cleanse the wound with Wound Cleanser prior to applying a clean dressing using gauze sponges, not tissues or cotton balls. Do not scrub or use excessive force. Pat dry using gauze sponges, not tissue or cotton balls. Peri-Wound Care Topical Betadine Discharge Instruction: paint toes Primary Dressing Secondary Dressing Secured With Compression Wrap Compression Stockings Add-Ons Coverlet Latex-Free Fabric Adhesive Dressings Discharge Instruction: Elastic Knuckle Bandage Electronic Signature(s) Signed: 03/25/2021 4:33:22 PM By: Donnamarie Poag Entered By: Donnamarie Poag on 03/25/2021 15:52:27 Bethard, [name] ([id number]) -------------------------------------------------------------------------------- Vitals Details Patient Name: [name] Date of Service: 03/25/2021 3:15 PM Medical Record Number: [id number] Patient Account Number: [id number] Date of Birth/Sex: December 12, 1950 (70  y.o. F) Treating RN: Donnamarie Poag Primary Care Darianny Momon: [name], Olivia Mackie Other Clinician: Referring Tyeesha Riker: [name], Olivia Mackie Treating Madysin Crisp/Extender: Skipper Cliche in Treatment: 58 Vital Signs Time Taken: 15:46 Temperature (F): 98.7 Height (in): 65.5 Pulse (bpm): 69 Weight (lbs): 240 Respiratory Rate (breaths/min): 18 Body Mass Index (BMI): 39.3 Blood Pressure (mmHg): 150/74 Reference Range: 80 - 120 mg / dl Electronic Signature(s) Signed: 03/25/2021 4:33:22 PM By: Donnamarie Poag Entered ByDonnamarie Poag on 03/25/2021 15:46:45

## 2021-03-25 NOTE — Progress Notes (Signed)
Patient here for initial oncology appointment, concerns of extreme SOB, wheezing, and fatigue

## 2021-03-26 LAB — HAPTOGLOBIN: Haptoglobin: 227 mg/dL (ref 42–346)

## 2021-03-27 ENCOUNTER — Other Ambulatory Visit: Payer: Self-pay | Admitting: Oncology

## 2021-03-27 ENCOUNTER — Telehealth: Payer: Self-pay | Admitting: Oncology

## 2021-03-27 ENCOUNTER — Ambulatory Visit: Payer: Medicare HMO | Admitting: Cardiovascular Disease

## 2021-03-27 DIAGNOSIS — D509 Iron deficiency anemia, unspecified: Secondary | ICD-10-CM | POA: Insufficient documentation

## 2021-03-27 DIAGNOSIS — D508 Other iron deficiency anemias: Secondary | ICD-10-CM

## 2021-03-27 NOTE — Telephone Encounter (Signed)
Left Vm with patient on her home and mobile phone. Per Dr. Janese Banks patient needs IV iron. Left direct extension for her to call back.

## 2021-03-27 NOTE — Progress Notes (Signed)
Hematology/Oncology Consult note [facility] Telephone:([phone] [phone]  Patient Care Team: [name], [name], MD as PCP - General (Internal Medicine) Wellington Hampshire, MD as PCP - Cardiology (Cardiology) [name], MD as Consulting Physician (Cardiology) Poggi, Marshall Cork, MD as Consulting Physician (Surgery) Sindy Guadeloupe, MD as Consulting Physician (Oncology)   Name of the patient: [name]  [id number]  01-03-1951    Reason for referral: Anemia   Referring physician-Dr. [name]  Date of visit: 03/27/21   History of presenting illness- Patient is a 71 year old female with a history of sleep apnea, bronchitis, diabetes CVA who has been referred for anemia.  Most recent CBC from June 2022 showed white cell count of 7.5, H&H of 11.1/34.5 with an MCV of 80.9 and a platelet count of 257.  Looking back at her prior CBCs her hemoglobin has been mostly between 9.5-10.5.  B12 levels in June was greater than 1550.  Folate was normal iron studies showed a low ferritin of 18 and iron saturation of 11%.  Patient currently reports fatigue which has been more than usual along with exertional shortness of breath.  Denies any blood loss in her stool or urine.  Denies any dark melanotic stools.  Last EGD was in September 2021 by Dr. Alice Reichert which showed possible candidiasis, benign-appearing esophageal stenosis and multiple gastric polyps which were negative for malignancy.  Last colonoscopy was in March 2019 which showed diverticulosis and polyps but no evidence of active bleeding.  ECOG PS- 2  Pain scale- 4   Review of systems- Review of Systems  Constitutional:  Positive for malaise/fatigue. Negative for chills, fever and weight loss.  HENT:  Negative for congestion, ear discharge and nosebleeds.   Eyes:  Negative for blurred vision.  Respiratory:  Positive for shortness of breath. Negative for cough, hemoptysis, sputum production  and wheezing.   Cardiovascular:  Negative for chest pain, palpitations, orthopnea and claudication.  Gastrointestinal:  Negative for abdominal pain, blood in stool, constipation, diarrhea, heartburn, melena, nausea and vomiting.  Genitourinary:  Negative for dysuria, flank pain, frequency, hematuria and urgency.  Musculoskeletal:  Negative for back pain, joint pain and myalgias.  Skin:  Negative for rash.  Neurological:  Negative for dizziness, tingling, focal weakness, seizures, weakness and headaches.  Endo/Heme/Allergies:  Does not bruise/bleed easily.  Psychiatric/Behavioral:  Negative for depression and suicidal ideas. The patient does not have insomnia.    Allergies  Allergen Reactions   Morphine And Related Other (See Comments) and Nausea Only    Patient becomes very confused Other reaction(s): Other (See Comments), Other (See Comments) Patient becomes very confused Patient becomes very confused    Penicillins Hives, Shortness Of Breath, Swelling and Other (See Comments)    Facial swelling Has patient had a PCN reaction causing immediate rash, facial/tongue/throat swelling, SOB or lightheadedness with hypotension: Yes Has patient had a PCN reaction causing severe rash involving mucus membranes or skin necrosis: Yes Has patient had a PCN reaction that required hospitalization No Has patient had a PCN reaction occurring within the last 10 years: No If all of the above answers are "NO", then may proceed with Cephalosporin use.  Other reaction(s): Other (See Comments), Other (See Comments) Facial swelling Has patient had a PCN reaction causing immediate rash, facial/tongue/throat swelling, SOB or lightheadedness with hypotension: Yes Has patient had a PCN reaction causing severe rash involving mucus membranes or skin necrosis: Yes Has patient had a PCN reaction that required hospitalization No Has patient  had a PCN reaction occurring within the last 10 years: No If all of the above  answers are "NO", then may proceed with Cephalosporin use. Facial swelling Has patient had a PCN reaction causing immediate rash, facial/tongue/throat swelling, SOB or lightheadedness with hypotension: Yes Has patient had a PCN reaction causing severe rash involving mucus membranes or skin necrosis: Yes Has patient had a PCN reaction that required hospitalization No Has patient had a PCN reaction occurring within the last 10 years: No If all of the above answers are "NO", then may proceed with Cephalosporin use. Facial swelling Has patient had a PCN reaction causing immediate rash, facial/tongue/throat swelling, SOB or lightheadedness with hypotension: Yes Has patient had a PCN reaction causing severe rash involving mucus membranes or skin necrosis: Yes Has patient had a PCN reaction that required hospitalization No Has patient had a PCN reaction occurring within the last 10 years: No If all of the above answers are "NO", then may proceed with Cephalosporin use.    Ambien [Zolpidem]     Hallucination    Aspirin Hives   Oxytetracycline Hives    Other reaction(s): Unknown     Patient Active Problem List   Diagnosis Date Noted   Recurrent strokes ([place]) 02/26/2021   Chronic shoulder pain (Bilateral) 02/10/2021   Impaired range of motion of shoulder (Left) 02/10/2021   Acute pain of shoulder (Left) 02/10/2021   Hyperlipidemia LDL goal <70 01/28/2021   Allergic rhinitis 24/11/7351   Uncomplicated opioid dependence ([place]) 07/15/2020   Cognitive deficit, post-stroke 06/22/2020   Peripheral neurogenic pain 06/20/2020   Chronic peripheral neuropathic pain 06/20/2020   Chronic use of opiate for therapeutic purpose 06/20/2020   History of cerebrovascular accident (CVA) due to embolism 06/19/2020   History of stroke with current residual effects 06/05/2020   ANA positive 04/18/2020   Nonsustained ventricular tachycardia 04/09/2020   Persistent atrial fibrillation ([place]) 04/04/2020   Bedbound  03/29/2020   B12 deficiency 03/11/2020   Ulcer of left heel and midfoot ([place]) 03/11/2020   PAD (peripheral artery disease) ([place]) 03/11/2020   Rotator cuff disorder, left 03/01/2020   Depression, recurrent ([place]) 03/01/2020   Cerebrovascular accident (CVA) ([place]) 03/01/2020   Primary osteoarthritis of left shoulder 02/12/2020   Folate deficiency 02/08/2020   Vitamin D deficiency 02/08/2020   Malnutrition of moderate degree 02/06/2020   Stroke ([place]) 02/05/2020   Chronic shoulder pain (Left) 01/03/2020   Left hemiparesis ([name]) 12/28/2019   Hypertension associated with diabetes ([place]) 29/92/4268   Acute embolic stroke ([place]) 34/19/6222   Chronic bacteriuria 11/17/2019   Hyperkalemia 09/14/2019   Bacteremia due to Gram-positive bacteria 09/08/2019   Pressure injury of skin 09/04/2019   Elevated troponin 09/02/2019   COPD with acute exacerbation ([place]) 09/02/2019   Acute respiratory failure with hypoxia ([place]) 09/02/2019   Pharmacologic therapy 08/30/2019   Recurrent falls 07/10/2019   Left ankle pain 06/02/2019   Chronic pain of right ankle 02/14/2019   Arthritis 02/14/2019   Nausea and vomiting 02/14/2019   Meralgia paresthetica (Right) 12/05/2018   Chronic anticoagulation (Eliquis) 12/05/2018   Peripheral vascular disease ([place]) 10/18/2018   Chronic ulcer of right leg ([place]) 10/18/2018   Fibromyalgia syndrome 08/17/2018   Chronic musculoskeletal pain 08/17/2018   Tremor of right hand 08/03/2018   Overactive bladder 08/03/2018   Physical deconditioning 08/03/2018   Recurrent UTI 07/29/2018   Foot ulcer, left ([place]) 03/09/2018   Adrenal mass ([place]) 08/02/2017   Eczema 06/27/2017   Diabetic ulcer of left foot ([place]) 06/24/2017  Mass of both adrenal glands ([place]) 05/16/2017   Fatty liver 05/13/2017   Hip strain (Right) 04/30/2017   Ischial pain, right 04/30/2017   Hip joint pain (Left) 04/22/2017   Abnormality of gait and mobility 04/22/2017   Muscle strain of hip (Left) 04/09/2017    Anemia 11/18/2016   Asthma 11/18/2016   Carpal tunnel syndrome 11/18/2016   Pain in joint involving ankle and foot 11/18/2016   Obstructive sleep apnea of adult 11/18/2016   Spinal stenosis of thoracic region 11/18/2016   Abscess of tendon of left foot 09/21/2016   Cubital tunnel syndrome (Right) 08/07/2016   Chronic pain syndrome 02/25/2016   Long term current use of opiate analgesic 02/25/2016   Chronic shoulder pain (Right) 02/25/2016   Status post reverse total shoulder replacement 10/08/2015   Neuropathy of lateral femoral cutaneous nerve (Right) 09/23/2015   Obesity, Class III, BMI 40-49.9 (morbid obesity) ([place]) 09/23/2015   Chronic prescription benzodiazepine use 09/23/2015   Arthralgia 09/23/2015   Osteoarthritis involving multiple joints 09/23/2015   Other shoulder lesions (Right) 08/28/2015   Cervical radiculitis 08/06/2015   Fall 05/22/2015   Opiate use (15 MME/Day) 01/16/2015   Impingement syndrome of shoulder region 01/07/2015   Mixed incontinence 11/26/2014   Atrophic vaginitis 11/26/2014   Depression with anxiety 09/21/2014   Bladder cystocele 09/21/2014   Gastro-esophageal reflux disease without esophagitis 09/21/2014   At high risk for falls 09/21/2014   DM (diabetes mellitus) type II, controlled, with peripheral vascular disorder ([place]) 08/31/2014   Diabetic peripheral neuropathy associated with type 2 diabetes mellitus ([place]) 08/31/2014   Asthma, well controlled 08/31/2014   Hypothyroidism, unspecified 08/31/2014   Peripheral vascular disease due to secondary diabetes mellitus ([place]) 12/11/2013   OSA (obstructive sleep apnea) 10/19/2013   AF (paroxysmal atrial fibrillation) ([place]) 07/27/2013   Essential hypertension 07/27/2013   Chronic atrial fibrillation 08/10/2012     Past Medical History:  Diagnosis Date   Abnormal antibody titer    Anxiety and depression    Arthritis    Asthma    Atypical chest pain    a. 03/2018 MV: No ischemia/infarct. EF >65%.    Carotid arterial disease ([place])    a. 0/4599 U/S: RICA <77, LICA nl.   Cystocele    Depression    Diabetes ([place])    DVT (deep venous thrombosis) ([place])    Righ calf   Dyspnea    11/08/2018 - "more now due to lack of exercise"   Edema    GERD (gastroesophageal reflux disease)    Heart murmur    a. 08/2013 Echo: EF 60-65%, Mild MR (poss veg seen on TTE, not seen on TEE), nl LV size with mod conc LVH. Mild LAE; b. 08/2019 Echo: EF 60-65%, no rwma. Nl RV size/fxn. No significant valvular dzs.   History of IBS    History of kidney stones    History of methicillin resistant staphylococcus aureus (MRSA) 2008   History of multiple strokes    Hyperlipidemia    Hypertension    Hypothyroidism    Insomnia    Kidney stone    kidney stones with lithotripsy .  [facility]- "adrenal glands"   Neuropathy involving both lower extremities    Non-healing ulcer of left foot (HCC)    Obesity, Class II, BMI 35-39.9, with comorbidity    PAF (paroxysmal atrial fibrillation) ([place])    a. 07/2013 Event monitor: Freq PVCs and 3 short runs of Afib-->CHA2DS2VASc = 7-->Eliquis.   Peripheral vascular disease ([place])  a. 03/2018 LE Angio: Nl renal arteries. Nl aorta & iliacs. Nl LLE arterial flow; b. 06/2020 ABI's:   Sleep apnea    use C-PAP   Stroke ([place])    03/2020   Urinary incontinence    Venous stasis dermatitis of both lower extremities      Past Surgical History:  Procedure Laterality Date   ANKLE SURGERY Right    APPLICATION OF WOUND VAC Left 06/27/2015   Procedure: APPLICATION OF WOUND VAC ( POSSIBLE ) ;  Surgeon: [name], MD;  Location: [facility] ORS;  Service: Vascular;  Laterality: Left;   APPLICATION OF WOUND VAC Left 09/25/2016   Procedure: APPLICATION OF WOUND VAC;  Surgeon: [name], DPM;  Location: [facility] ORS;  Service: Podiatry;  Laterality: Left;   arm surgery     right     CHOLECYSTECTOMY     COLONOSCOPY     COLONOSCOPY WITH PROPOFOL N/A 01/11/2017   Procedure: COLONOSCOPY WITH  PROPOFOL;  Surgeon: [name], MD;  Location: [facility] ENDOSCOPY;  Service: Endoscopy;  Laterality: N/A;   COLONOSCOPY WITH PROPOFOL N/A 02/22/2017   Procedure: COLONOSCOPY WITH PROPOFOL;  Surgeon: [name], MD;  Location: [facility] ENDOSCOPY;  Service: Endoscopy;  Laterality: N/A;   COLONOSCOPY WITH PROPOFOL N/A 05/31/2017   Procedure: COLONOSCOPY WITH PROPOFOL;  Surgeon: [name], MD;  Location: [facility] ENDOSCOPY;  Service: Endoscopy;  Laterality: N/A;   ESOPHAGOGASTRODUODENOSCOPY (EGD) WITH PROPOFOL N/A 01/11/2017   Procedure: ESOPHAGOGASTRODUODENOSCOPY (EGD) WITH PROPOFOL;  Surgeon: [name], MD;  Location: [facility] ENDOSCOPY;  Service: Endoscopy;  Laterality: N/A;   ESOPHAGOGASTRODUODENOSCOPY (EGD) WITH PROPOFOL N/A 10/25/2018   Procedure: ESOPHAGOGASTRODUODENOSCOPY (EGD) WITH PROPOFOL;  Surgeon: [name], MD;  Location: [facility] ENDOSCOPY;  Service: Endoscopy;  Laterality: N/A;   ESOPHAGOGASTRODUODENOSCOPY (EGD) WITH PROPOFOL N/A 11/29/2019   Procedure: ESOPHAGOGASTRODUODENOSCOPY (EGD) WITH PROPOFOL;  Surgeon: Toledo, Benay Pike, MD;  Location: [facility] ENDOSCOPY;  Service: Gastroenterology;  Laterality: N/A;   HERNIA REPAIR     umbilical   HIATAL HERNIA REPAIR     I & D EXTREMITY Left 06/27/2015   Procedure: IRRIGATION AND DEBRIDEMENT EXTREMITY            ( CALF HEMATOMA ) POSSIBLE WOUND VAC;  Surgeon: [name], MD;  Location: [facility] ORS;  Service: Vascular;  Laterality: Left;   INCISION AND DRAINAGE OF WOUND Left 09/25/2016   Procedure: IRRIGATION AND DEBRIDEMENT - PARTIAL RESECTION OF ACHILLES TENDON WITH WOUND VAC APPLICATION;  Surgeon: [name], DPM;  Location: [facility] ORS;  Service: Podiatry;  Laterality: Left;   INCISION AND DRAINAGE OF WOUND Left 11/09/2018   Procedure: Excision of left foot wound with ACell placement;  Surgeon: Wallace Going, DO;  Location: [place];  Service: Plastics;  Laterality: Left;   IRRIGATION AND DEBRIDEMENT ABSCESS Left 09/07/2016    Procedure: IRRIGATION AND DEBRIDEMENT ABSCESS LEFT HEEL;  Surgeon: [name], DPM;  Location: [facility] ORS;  Service: Podiatry;  Laterality: Left;   JOINT REPLACEMENT  2013   left knee replacement   KIDNEY SURGERY Right    kidney stones   LITHOTRIPSY     LOWER EXTREMITY ANGIOGRAPHY Left 04/04/2018   Procedure: LOWER EXTREMITY ANGIOGRAPHY;  Surgeon: [name], MD;  Location: [facility];  Service: Cardiovascular;  Laterality: Left;   NM GATED MYOVIEW [facility] HX)  February 2017   Likely breast attenuation. LOW RISK study. Normal EF 55-60%.   OTHER SURGICAL HISTORY     08/2016 or 09/2016 surgery on  achilles tenso h/o staph infection heal. Dr. [name]   removal of left hematoma Left    leg   REVERSE SHOULDER ARTHROPLASTY Right 10/08/2015   Procedure: REVERSE SHOULDER ARTHROPLASTY;  Surgeon: [name], MD;  Location: [facility] ORS;  Service: Orthopedics;  Laterality: Right;   SLEEVE GASTROPLASTY     TONSILLECTOMY     TOTAL KNEE ARTHROPLASTY Left    TOTAL SHOULDER REPLACEMENT Right    TRANSESOPHAGEAL ECHOCARDIOGRAM  08/08/2013   Mild LVH, EF 60-65%. Moderate LA dilation and mild RA dilation. Mild MR with no evidence of stenosis and no evidence of endocarditis. A false chordae is noted.   TRANSTHORACIC ECHOCARDIOGRAM  08/03/2013   Mild-Moderate concentric LVH, EF 60-65%. Normal diastolic function. Mild LA dilation. Mild MR with possible vegetation  - not confirmed on TEE    ULNAR NERVE TRANSPOSITION Right 08/06/2016   Procedure: ULNAR NERVE DECOMPRESSION/TRANSPOSITION;  Surgeon: [name], MD;  Location: [facility] ORS;  Service: Orthopedics;  Laterality: Right;   UPPER GI ENDOSCOPY     VAGINAL HYSTERECTOMY      Social History   Socioeconomic History   Marital status: Married    Spouse name: Not on file   Number of children: Not on file   Years of education: Not on file   Highest education level: Not on file  Occupational History   Not on file  Tobacco Use   Smoking status:  Never   Smokeless tobacco: Never  Vaping Use   Vaping Use: Never used  Substance and Sexual Activity   Alcohol use: No    Alcohol/week: 0.0 standard drinks   Drug use: Never   Sexual activity: Not Currently  Other Topics Concern   Not on file  Social History Narrative   She is currently married -- for 30+ years. Does not work. Does not smoke or take alcohol. She never smoked. She exercises at least 3 days a week since before her gastric surgery.   Marital status reviewed in history of present illness.   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    Social Determinants of Radio broadcast assistant Strain: Low Risk    Difficulty of Paying Living Expenses: Not very hard  Food Insecurity: No Food Insecurity   Worried About Charity fundraiser in the Last Year: Never true   Arboriculturist in the Last Year: Never true  Transportation Needs: Public librarian (Medical): Yes   Lack of Transportation (Non-Medical): Yes  Physical Activity: Not on file  Stress: Not on file  Social Connections: Not on file  Intimate Partner Violence: Not on file     Family History  Problem Relation Age of Onset   Skin cancer Father    Diabetes Father    Hypertension Father    Peripheral vascular disease Father    Cancer Father    Cerebral aneurysm Father    Alcohol abuse Father    Varicose Veins Mother    Kidney disease Mother    Arthritis Mother    Mental illness Sister    Cancer Maternal Aunt        breast   Breast cancer Maternal Aunt    Arthritis Maternal Grandmother    Hypertension Maternal Grandmother    Diabetes Maternal Grandmother    Arthritis Maternal Grandfather    Heart disease Maternal Grandfather    Hypertension Maternal Grandfather  Arthritis Paternal Grandmother    Hypertension Paternal Grandmother    Diabetes Paternal Grandmother    Arthritis Paternal Grandfather    Hypertension  Paternal Grandfather    Bladder Cancer Neg Hx    Kidney cancer Neg Hx      Current Outpatient Medications:    acetaminophen (TYLENOL) 325 MG tablet, Take 2 tablets (650 mg total) by mouth every 6 (six) hours as needed for mild pain or moderate pain (or Fever >/= 101)., Disp: , Rfl:    albuterol (PROVENTIL) (2.5 MG/3ML) 0.083% nebulizer solution, Take 3 mLs (2.5 mg total) by nebulization every 4 (four) hours as needed for wheezing or shortness of breath., Disp: 75 mL, Rfl: 12   ALPRAZolam (XANAX) 0.5 MG tablet, TAKE 1/2 TABLET BY MOUTH TWICE DAILY AS NEEDED FOR ANXIETY, Disp: 60 tablet, Rfl: 5   apixaban (ELIQUIS) 5 MG TABS tablet, TAKE 1 TABLET BY MOUTH TWICE DAILY, Disp: 180 tablet, Rfl: 1   benzonatate (TESSALON) 200 MG capsule, Take 1 capsule (200 mg total) by mouth 3 (three) times daily as needed for cough., Disp: 30 capsule, Rfl: 1   buPROPion (WELLBUTRIN XL) 150 MG 24 hr tablet, Take 1 tablet (150 mg total) by mouth daily., Disp: 90 tablet, Rfl: 3   calcium-vitamin D (OSCAL WITH D) 500-200 MG-UNIT tablet, Take 1 tablet by mouth 2 (two) times daily with a meal., Disp: 60 tablet, Rfl: 0   cholecalciferol (VITAMIN D) 25 MCG tablet, Take 1 tablet (1,000 Units total) by mouth 2 (two) times daily with a meal., Disp: 30 tablet, Rfl: 2   clobetasol cream (TEMOVATE) 4.43 %, Apply 1 application topically 2 (two) times daily as needed (skin irritation). , Disp: , Rfl:    colchicine 0.6 MG tablet, Take 1 tablet (0.6 mg total) by mouth as needed. May repeat 1 pill in 1 hr. No more than 3 total pills 1.8 mg in 24 hours as needed gout flare, Disp: 30 tablet, Rfl: 2   collagenase (SANTYL) ointment, Apply 1 application topically at bedtime. X7-10 days, Disp: 30 g, Rfl: 0   diclofenac Sodium (VOLTAREN) 1 % GEL, Apply 2 g topically 3 (three) times daily., Disp: 100 g, Rfl: 2   DULoxetine (CYMBALTA) 60 MG capsule, Take 1 capsule (60 mg total) by mouth daily., Disp: 90 capsule, Rfl: 3   ergocalciferol (VITAMIN  D2) 1.25 MG (50000 UT) capsule, Take 50,000 Units by mouth once a week., Disp: , Rfl:    estradiol (ESTRACE) 0.1 MG/GM vaginal cream, Place 1 Applicatorful vaginally every Monday, Wednesday, and Friday. Apply 0.61m (pea-sized amount)  just inside the vaginal introitus with a finger-tip on Monday, Wednesday and Friday nights,, Disp: 42.5 g, Rfl: 11   fluconazole (DIFLUCAN) 150 MG tablet, Take by mouth daily., Disp: , Rfl:    fluconazole (DIFLUCAN) 150 MG tablet, Take 1 tablet (150 mg total) by mouth daily., Disp: 1 tablet, Rfl: 0   fluticasone-salmeterol (ADVAIR HFA) 115-21 MCG/ACT inhaler, Inhale 2 puffs into the lungs daily. Rinse mouth thoroughly after use, Disp: 1 each, Rfl: 12   folic acid (FOLVITE) 1 MG tablet, Take 1 tablet (1 mg total) by mouth daily., Disp: 90 tablet, Rfl: 3   furosemide (LASIX) 20 MG tablet, TAKE ONE TABLET BY MOUTH EVERY DAY, Disp: 30 tablet, Rfl: 0   HYDROcodone-acetaminophen (NORCO/VICODIN) 5-325 MG tablet, Take 1 tablet by mouth 2 (two) times daily as needed for severe pain. Must last 30 days, Disp: 45 tablet, Rfl: 0   HYDROcodone-acetaminophen (NORCO/VICODIN) 5-325 MG tablet,  Take 1 tablet by mouth 2 (two) times daily as needed for severe pain. Must last 30 days, Disp: 45 tablet, Rfl: 0   [START ON 04/12/2021] HYDROcodone-acetaminophen (NORCO/VICODIN) 5-325 MG tablet, Take 1 tablet by mouth 2 (two) times daily as needed for severe pain. Must last 30 days, Disp: 45 tablet, Rfl: 0   levocetirizine (XYZAL) 5 MG tablet, Take 1 tablet (5 mg total) by mouth at bedtime as needed for allergies., Disp: 90 tablet, Rfl: 3   levothyroxine (SYNTHROID) 150 MCG tablet, Take 1 tablet (150 mcg total) by mouth daily before breakfast. D/c prior sig, Disp: 90 tablet, Rfl: 3   metFORMIN (GLUCOPHAGE) 500 MG tablet, Take 1 tablet (500 mg total) by mouth daily with breakfast. With food, Disp: 90 tablet, Rfl: 3   metoprolol succinate (TOPROL XL) 25 MG 24 hr tablet, Take 0.5 tablets (12.5 mg total)  by mouth daily., Disp: 45 tablet, Rfl: 0   Misc. Devices (TUB TRANSFER BOARD) MISC, 1 Device by Does not apply route as needed., Disp: 1 each, Rfl: 0   montelukast (SINGULAIR) 10 MG tablet, Take 1 tablet (10 mg total) by mouth at bedtime., Disp: 90 tablet, Rfl: 3   Multiple Vitamin (MULTIVITAMIN WITH MINERALS) TABS tablet, Take 1 tablet by mouth daily., Disp: , Rfl:    mupirocin ointment (BACTROBAN) 2 %, Apply 1 application topically 2 (two) times daily. Right lower leg, Disp: 30 g, Rfl: 2   ondansetron (ZOFRAN) 4 MG tablet, Take 1 tablet (4 mg total) by mouth every 8 (eight) hours as needed for nausea or vomiting., Disp: 90 tablet, Rfl: 1   pantoprazole (PROTONIX) 40 MG tablet, Take 1 tablet (40 mg total) by mouth daily. 30 min before food, Disp: 90 tablet, Rfl: 3   predniSONE (DELTASONE) 20 MG tablet, Take 1 tablet (20 mg total) by mouth daily with breakfast., Disp: 5 tablet, Rfl: 0   pregabalin (LYRICA) 50 MG capsule, TAKE 1 CAPSULE (50 MG) BY MOUTH 3 TIMES DAILY, Disp: 90 capsule, Rfl: 0   rosuvastatin (CRESTOR) 20 MG tablet, TAKE 1 TABLET BY  MOUTH ONCE DAILY, Disp: 30 tablet, Rfl: 5   sitaGLIPtin (JANUVIA) 50 MG tablet, Take 1 tablet (50 mg total) by mouth daily., Disp: 90 tablet, Rfl: 3   sucralfate (CARAFATE) 1 GM/10ML suspension, Take 1 g by mouth 3 (three) times daily. , Disp: , Rfl:    thiamine 100 MG tablet, Take 0.5 tablets (50 mg total) by mouth 2 (two) times daily with a meal., Disp: 60 tablet, Rfl: 2   Vibegron (GEMTESA) 75 MG TABS, Take 75 mg by mouth daily., Disp: 30 tablet, Rfl: 5   vitamin B-12 1000 MCG tablet, Take 1 tablet (1,000 mcg total) by mouth daily., Disp: 30 tablet, Rfl: 2   losartan (COZAAR) 50 MG tablet, Take 1 tablet (50 mg total) by mouth daily. D/c losartan 50-12.5 (Patient not taking: Reported on 03/25/2021), Disp: 90 tablet, Rfl: 3   Physical exam:  Vitals:   03/25/21 1419  BP: (!) 150/81  Pulse: 65  Resp: 20  Temp: (!) 96 F (35.6 C)  TempSrc: Tympanic   Weight: 270 lb (122.5 kg)   Physical Exam Constitutional:      General: She is not in acute distress. Cardiovascular:     Rate and Rhythm: Normal rate and regular rhythm.     Heart sounds: Normal heart sounds.  Pulmonary:     Effort: Pulmonary effort is normal.     Breath sounds: Normal breath sounds.  Abdominal:  General: Bowel sounds are normal.     Palpations: Abdomen is soft.  Skin:    General: Skin is warm and dry.  Neurological:     Mental Status: She is alert and oriented to person, place, and time.       CMP Latest Ref Rng & Units 03/25/2021  Glucose 70 - 99 mg/dL 154(H)  BUN 8 - 23 mg/dL 23  Creatinine 0.44 - 1.00 mg/dL 1.19(H)  Sodium 135 - 145 mmol/L 137  Potassium 3.5 - 5.1 mmol/L 4.7  Chloride 98 - 111 mmol/L 99  CO2 22 - 32 mmol/L 28  Calcium 8.9 - 10.3 mg/dL 8.5(L)  Total Protein 6.5 - 8.1 g/dL 6.6  Total Bilirubin 0.3 - 1.2 mg/dL 0.3  Alkaline Phos 38 - 126 U/L 94  AST 15 - 41 U/L 14(L)  ALT 0 - 44 U/L 9   CBC Latest Ref Rng & Units 03/25/2021  WBC 4.0 - 10.5 K/uL 7.6  Hemoglobin 12.0 - 15.0 g/dL 8.6(L)  Hematocrit 36.0 - 46.0 % 29.6(L)  Platelets 150 - 400 K/uL 239   Assessment and plan- Patient is a 71 y.o. adult referred for borderline microcytic anemia  I will do a complete anemia work-up today including CBC ferritin and iron studies B12 folate TSH haptoglobin multiple myeloma panelReticulocyte count.  Suspect patient does have evidence of iron deficiency given her borderline microcytosis as well as evidence of iron deficiency in the past.  I will see her for in person or video visit in 2 weeks time.  If there is evidence of iron deficiency she will need to be referred back to GI for evaluation of suspected blood loss   Thank you for this kind referral and the opportunity to participate in the care of this patient   Visit Diagnosis 1. Normocytic anemia     Dr. Randa Evens, MD, MPH [facility] [phone] 03/27/2021

## 2021-03-28 DIAGNOSIS — L97812 Non-pressure chronic ulcer of other part of right lower leg with fat layer exposed: Secondary | ICD-10-CM | POA: Diagnosis not present

## 2021-03-28 DIAGNOSIS — L97522 Non-pressure chronic ulcer of other part of left foot with fat layer exposed: Secondary | ICD-10-CM | POA: Diagnosis not present

## 2021-03-28 DIAGNOSIS — E1152 Type 2 diabetes mellitus with diabetic peripheral angiopathy with gangrene: Secondary | ICD-10-CM | POA: Diagnosis not present

## 2021-03-28 DIAGNOSIS — E1142 Type 2 diabetes mellitus with diabetic polyneuropathy: Secondary | ICD-10-CM | POA: Diagnosis not present

## 2021-03-28 DIAGNOSIS — I872 Venous insufficiency (chronic) (peripheral): Secondary | ICD-10-CM | POA: Diagnosis not present

## 2021-03-28 DIAGNOSIS — E1159 Type 2 diabetes mellitus with other circulatory complications: Secondary | ICD-10-CM | POA: Diagnosis not present

## 2021-03-28 DIAGNOSIS — J449 Chronic obstructive pulmonary disease, unspecified: Secondary | ICD-10-CM | POA: Diagnosis not present

## 2021-03-28 DIAGNOSIS — E11621 Type 2 diabetes mellitus with foot ulcer: Secondary | ICD-10-CM | POA: Diagnosis not present

## 2021-03-28 DIAGNOSIS — M86572 Other chronic hematogenous osteomyelitis, left ankle and foot: Secondary | ICD-10-CM | POA: Diagnosis not present

## 2021-03-28 DIAGNOSIS — I152 Hypertension secondary to endocrine disorders: Secondary | ICD-10-CM | POA: Diagnosis not present

## 2021-03-31 LAB — MULTIPLE MYELOMA PANEL, SERUM
Albumin SerPl Elph-Mcnc: 3.2 g/dL (ref 2.9–4.4)
Albumin/Glob SerPl: 1.2 (ref 0.7–1.7)
Alpha 1: 0.3 g/dL (ref 0.0–0.4)
Alpha2 Glob SerPl Elph-Mcnc: 0.9 g/dL (ref 0.4–1.0)
B-Globulin SerPl Elph-Mcnc: 0.9 g/dL (ref 0.7–1.3)
Gamma Glob SerPl Elph-Mcnc: 0.8 g/dL (ref 0.4–1.8)
Globulin, Total: 2.9 g/dL (ref 2.2–3.9)
IgA: 118 mg/dL (ref 64–422)
IgG (Immunoglobin G), Serum: 848 mg/dL (ref 586–1602)
IgM (Immunoglobulin M), Srm: 117 mg/dL (ref 26–217)
Total Protein ELP: 6.1 g/dL (ref 6.0–8.5)

## 2021-04-01 ENCOUNTER — Ambulatory Visit: Payer: Medicare HMO | Admitting: Physician Assistant

## 2021-04-02 ENCOUNTER — Other Ambulatory Visit: Payer: Self-pay | Admitting: Nurse Practitioner

## 2021-04-02 ENCOUNTER — Other Ambulatory Visit: Payer: Self-pay | Admitting: Internal Medicine

## 2021-04-02 DIAGNOSIS — I480 Paroxysmal atrial fibrillation: Secondary | ICD-10-CM

## 2021-04-02 DIAGNOSIS — L97812 Non-pressure chronic ulcer of other part of right lower leg with fat layer exposed: Secondary | ICD-10-CM | POA: Diagnosis not present

## 2021-04-02 DIAGNOSIS — I872 Venous insufficiency (chronic) (peripheral): Secondary | ICD-10-CM | POA: Diagnosis not present

## 2021-04-02 DIAGNOSIS — J449 Chronic obstructive pulmonary disease, unspecified: Secondary | ICD-10-CM | POA: Diagnosis not present

## 2021-04-02 DIAGNOSIS — L97522 Non-pressure chronic ulcer of other part of left foot with fat layer exposed: Secondary | ICD-10-CM | POA: Diagnosis not present

## 2021-04-02 DIAGNOSIS — E1142 Type 2 diabetes mellitus with diabetic polyneuropathy: Secondary | ICD-10-CM | POA: Diagnosis not present

## 2021-04-02 DIAGNOSIS — E1152 Type 2 diabetes mellitus with diabetic peripheral angiopathy with gangrene: Secondary | ICD-10-CM | POA: Diagnosis not present

## 2021-04-02 DIAGNOSIS — I152 Hypertension secondary to endocrine disorders: Secondary | ICD-10-CM | POA: Diagnosis not present

## 2021-04-02 DIAGNOSIS — E11621 Type 2 diabetes mellitus with foot ulcer: Secondary | ICD-10-CM | POA: Diagnosis not present

## 2021-04-02 DIAGNOSIS — E1159 Type 2 diabetes mellitus with other circulatory complications: Secondary | ICD-10-CM | POA: Diagnosis not present

## 2021-04-02 DIAGNOSIS — M86572 Other chronic hematogenous osteomyelitis, left ankle and foot: Secondary | ICD-10-CM | POA: Diagnosis not present

## 2021-04-02 NOTE — Progress Notes (Signed)
Agree with the details of the visit as noted by Tammy Parrett, NP.  C. Laura Nyia Tsao, MD [facility] 

## 2021-04-03 ENCOUNTER — Inpatient Hospital Stay: Payer: Medicare HMO

## 2021-04-03 ENCOUNTER — Telehealth: Payer: Self-pay | Admitting: Oncology

## 2021-04-03 NOTE — Telephone Encounter (Signed)
Husband called to reschedule pt 's appt for today. Pt is not feeling well. Husband would like to talk to RN about her infusions. Call back at [phone]

## 2021-04-03 NOTE — Telephone Encounter (Signed)
I called and spoke to husband and he wanted to know how long the infusion is. I told him it is usually 1 1/2 hours . Once pt gets into infusion they get IV started and release the med to be mixed. Then RN gives the med and she sits for 30 min. To make sure everything is ok. He was told that home health says that it is several hours and I told him it depends on how much iron and she will get 200 mg each time and that is usually the only mg we use. He wants a new appt. Anderson Malta will call him back and reschedule the appt

## 2021-04-04 ENCOUNTER — Encounter: Payer: Medicare HMO | Admitting: Physician Assistant

## 2021-04-04 ENCOUNTER — Other Ambulatory Visit: Payer: Self-pay

## 2021-04-04 DIAGNOSIS — Z7901 Long term (current) use of anticoagulants: Secondary | ICD-10-CM | POA: Diagnosis not present

## 2021-04-04 DIAGNOSIS — I872 Venous insufficiency (chronic) (peripheral): Secondary | ICD-10-CM | POA: Diagnosis not present

## 2021-04-04 DIAGNOSIS — L97522 Non-pressure chronic ulcer of other part of left foot with fat layer exposed: Secondary | ICD-10-CM | POA: Diagnosis not present

## 2021-04-04 DIAGNOSIS — L97812 Non-pressure chronic ulcer of other part of right lower leg with fat layer exposed: Secondary | ICD-10-CM | POA: Diagnosis not present

## 2021-04-04 DIAGNOSIS — M86572 Other chronic hematogenous osteomyelitis, left ankle and foot: Secondary | ICD-10-CM | POA: Diagnosis not present

## 2021-04-04 DIAGNOSIS — E11621 Type 2 diabetes mellitus with foot ulcer: Secondary | ICD-10-CM | POA: Diagnosis not present

## 2021-04-04 DIAGNOSIS — L97822 Non-pressure chronic ulcer of other part of left lower leg with fat layer exposed: Secondary | ICD-10-CM | POA: Diagnosis not present

## 2021-04-04 DIAGNOSIS — I10 Essential (primary) hypertension: Secondary | ICD-10-CM | POA: Diagnosis not present

## 2021-04-04 DIAGNOSIS — L89890 Pressure ulcer of other site, unstageable: Secondary | ICD-10-CM | POA: Diagnosis not present

## 2021-04-04 NOTE — Progress Notes (Signed)
[name] ([id number]) Visit Report for 04/04/2021 Arrival Information Details Patient Name: [name], [name] Date of Service: 04/04/2021 10:15 AM Medical Record Number: [id number] Patient Account Number: [id number] Date of Birth/Sex: 1951/01/19 (71 y.o. F) F) Treating RN: [name] Primary Care [name]: [name], Dawn Mackie Other Clinician: Referring [name]: [name], Dawn Mackie Treating [name]/Extender: [name] in Treatment: 53 Visit Information History Since Last Visit All ordered tests and consults were completed: No Patient Arrived: Wheel Chair Added or deleted any medications: No Arrival Time: 10:30 Any new allergies or adverse reactions: No Accompanied By: self Had a fall or experienced change in No Transfer Assistance: None activities of daily living that may affect Patient Identification Verified: Yes risk of falls: Secondary Verification Process Completed: Yes Signs or symptoms of abuse/neglect since last visito No Patient Requires Transmission-Based No Hospitalized since last visit: No Precautions: Implantable device outside of the clinic excluding No Patient Has Alerts: Yes cellular tissue based products placed in the center Patient Alerts: Patient on Blood Thinner since last visit: Eliquis Has Dressing in Place as Prescribed: Yes Diabetic II Pain Present Now: No 06/2020 ABI L)1.22 R) 1.14 Electronic Signature(s) Signed: 04/04/2021 4:26:12 PM By: Dawn Coria RN Entered By: [name] on 04/04/2021 10:46:03 [name], Dawn Reining ([id number]) -------------------------------------------------------------------------------- Clinic Level of Care Assessment Details Patient Name: [name] Date of Service: 04/04/2021 10:15 AM Medical Record Number: [id number] Patient Account Number: [id number] Date of Birth/Sex: 1950-07-18 (71 y.o. F) F) Treating RN: [name] Primary Care Kelbie Moro: [name], Dawn Mackie Other Clinician: Referring  Alizea Pell: [name], Dawn Mackie Treating Fateh Kindle/Extender: [name] in Treatment: 33 Clinic Level of Care Assessment Items TOOL 4 Quantity Score X - Use when only an EandM is performed on FOLLOW-UP visit 1 0 ASSESSMENTS - Nursing Assessment / Reassessment X - Reassessment of Co-morbidities (includes updates in patient status) 1 10 X- 1 5 Reassessment of Adherence to Treatment Plan ASSESSMENTS - Wound and Skin Assessment / Reassessment []  - Simple Wound Assessment / Reassessment - one wound 0 X- 3 5 Complex Wound Assessment / Reassessment - multiple wounds []  - 0 Dermatologic / Skin Assessment (not related to wound area) ASSESSMENTS - Focused Assessment []  - Circumferential Edema Measurements - multi extremities 0 []  - 0 Nutritional Assessment / Counseling / Intervention []  - 0 Lower Extremity Assessment (monofilament, tuning fork, pulses) []  - 0 Peripheral Arterial Disease Assessment (using hand held doppler) ASSESSMENTS - Ostomy and/or Continence Assessment and Care []  - Incontinence Assessment and Management 0 []  - 0 Ostomy Care Assessment and Management (repouching, etc.) PROCESS - Coordination of Care X - Simple Patient / Family Education for ongoing care 1 15 []  - 0 Complex (extensive) Patient / Family Education for ongoing care []  - 0 Staff obtains Programmer, systems, Records, Test Results / Process Orders []  - 0 Staff telephones HHA, Nursing Homes / Clarify orders / etc []  - 0 Routine Transfer to another Facility (non-emergent condition) []  - 0 Routine Hospital Admission (non-emergent condition) []  - 0 New Admissions / Biomedical engineer / Ordering NPWT, Apligraf, etc. []  - 0 Emergency Hospital Admission (emergent condition) X- 1 10 Simple Discharge Coordination []  - 0 Complex (extensive) Discharge Coordination PROCESS - Special Needs []  - Pediatric / Minor Patient Management 0 []  - 0 Isolation Patient Management []  - 0 Hearing / Language / Visual  special needs []  - 0 Assessment of Community assistance (transportation, D/C planning, etc.) []  - 0 Additional assistance / Altered mentation []  - 0 Support Surface(s) Assessment (bed, cushion, seat, etc.) INTERVENTIONS - Wound  Cleansing / Measurement [name], Dawn B. ([id number]) []  - 0 Simple Wound Cleansing - one wound X- 3 5 Complex Wound Cleansing - multiple wounds X- 1 5 Wound Imaging (photographs - any number of wounds) []  - 0 Wound Tracing (instead of photographs) []  - 0 Simple Wound Measurement - one wound X- 3 5 Complex Wound Measurement - multiple wounds INTERVENTIONS - Wound Dressings X - Small Wound Dressing one or multiple wounds 3 10 []  - 0 Medium Wound Dressing one or multiple wounds []  - 0 Large Wound Dressing one or multiple wounds X- 1 5 Application of Medications - topical []  - 0 Application of Medications - injection INTERVENTIONS - Miscellaneous []  - External ear exam 0 []  - 0 Specimen Collection (cultures, biopsies, blood, body fluids, etc.) []  - 0 Specimen(s) / Culture(s) sent or taken to Lab for analysis []  - 0 Patient Transfer (multiple staff / Civil Service fast streamer / Similar devices) []  - 0 Simple Staple / Suture removal (25 or less) []  - 0 Complex Staple / Suture removal (26 or more) []  - 0 Hypo / Hyperglycemic Management (close monitor of Blood Glucose) []  - 0 Ankle / Brachial Index (ABI) - do not check if billed separately X- 1 5 Vital Signs Has the patient been seen at the hospital within the last three years: Yes Total Score: 130 Level Of Care: New/Established - Level 4 Electronic Signature(s) Signed: 04/04/2021 4:26:12 PM By: Dawn Coria RN Entered By: [name] on 04/04/2021 11:13:48 [name] ([id number]) -------------------------------------------------------------------------------- Encounter Discharge Information Details Patient Name: [name] Date of Service: 04/04/2021 10:15 AM Medical Record Number:  [id number] Patient Account Number: [id number] Date of Birth/Sex: Jan 10, 1951 (71 y.o. F) F) Treating RN: [name] Primary Care [name]: [name], Dawn Mackie Other Clinician: Referring Shameca Landen: [name], Dawn Mackie Treating Geron Mulford/Extender: [name] in Treatment: 51 Encounter Discharge Information Items Discharge Condition: Stable Ambulatory Status: Wheelchair Discharge Destination: Home Transportation: Private Auto Accompanied By: self Schedule Follow-up Appointment: Yes Clinical Summary of Care: Patient Declined Electronic Signature(s) Signed: 04/04/2021 4:26:12 PM By: Dawn Coria RN Entered By: [name] on 04/04/2021 11:15:13 [name] ([id number]) -------------------------------------------------------------------------------- Lower Extremity Assessment Details Patient Name: [name] Date of Service: 04/04/2021 10:15 AM Medical Record Number: [id number] Patient Account Number: [id number] Date of Birth/Sex: [birth year] (70 y.o. F) Treating RN: [name] Primary Care Aeris Hersman: [name], Dawn Mackie Other Clinician: Referring Frankie Scipio: [name], Dawn Mackie Treating Yves Fodor/Extender: [name] in Treatment: 40 Vascular Assessment Pulses: Dorsalis Pedis Palpable: [Left:Yes] [Right:Yes] Electronic Signature(s) Signed: 04/04/2021 10:58:52 AM By: Dawn Coria RN Entered By: [name] on 04/04/2021 10:58:51 NALEIGHA, RAIMONDI ([id number]) -------------------------------------------------------------------------------- Multi Wound Chart Details Patient Name: [name] Date of Service: 04/04/2021 10:15 AM Medical Record Number: [id number] Patient Account Number: [id number] Date of Birth/Sex: 04-27-1950 (72 y.o. 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 Extremity Extremity Comorbid History: Asthma, Pneumothorax, Asthma, Pneumothorax, Asthma, Pneumothorax, Arrhythmia, Hypertension, Type II Arrhythmia, Hypertension, Type II Arrhythmia, Hypertension, Type II Diabetes, Gout, Osteoarthritis, Diabetes, Gout, Osteoarthritis, Diabetes, Gout, Osteoarthritis, Neuropathy Neuropathy Neuropathy Date Acquired: 10/31/2020 04/01/2021 04/01/2021 Weeks  of Treatment: 18 0 0 Wound Status: Open Open Open Clustered Wound: No No No Measurements L x W x D (cm) 0x0x0 0.5x0.5x0.5 0.5x0.5x0.1 Area (cm) : 0 0.196 0.196 Volume (cm) : 0 0.098 0.02 % Reduction in Area: 100.00% N/A N/A % Reduction in Volume: 100.00% N/A N/A Classification: Grade 3 Grade 2 Grade 2 Exudate Amount: None Present Medium Medium Exudate Type: N/A Serosanguineous Serosanguineous Exudate Color: N/A red, brown red, brown Wound Margin: Thickened N/A N/A Granulation Amount: None Present (0%) Medium (34-66%) Medium (34-66%) Granulation Quality: N/A Pink Pink Necrotic Amount: None Present (0%) Medium (34-66%) Medium (34-66%) Exposed Structures: Fascia: No Fat Layer (Subcutaneous Tissue): Fat Layer (Subcutaneous Tissue): Fat Layer (Subcutaneous Tissue): Yes Yes No Fascia: No Fascia: No Tendon: No Tendon: No Tendon: No Muscle: No Muscle: No Muscle: No Joint: No Joint: No Joint: No Bone: No Bone: No Bone: No Epithelialization: Large (67-100%) N/A None Wound Number: 20 N/A N/A Photos: N/A N/A ELSIA, LASOTA ([id number]) Wound Location:  Left, Proximal, Anterior Lower Leg N/A N/A Wounding Event: Blister N/A N/A Primary Etiology: 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 Comorbid History: Asthma, Pneumothorax, N/A N/A Arrhythmia, Hypertension, Type II Diabetes, Gout, Osteoarthritis, Neuropathy Date Acquired: 03/30/2021 N/A N/A Weeks of Treatment: 0 N/A N/A Wound Status: Open N/A N/A Clustered Wound: Yes N/A N/A Measurements L x W x D (cm) 2x3x0.1 N/A N/A Area (cm) : 4.712 N/A N/A Volume (cm) : 0.471 N/A N/A % Reduction in Area: N/A N/A N/A % Reduction in Volume: N/A N/A N/A Classification: Grade 2 N/A N/A Exudate Amount: Medium N/A N/A Exudate Type: Serosanguineous N/A N/A Exudate Color: red, brown N/A N/A Wound Margin: N/A N/A N/A Granulation Amount: Medium (34-66%) N/A N/A Granulation Quality: Pink N/A N/A Necrotic Amount: Medium (34-66%) N/A N/A Exposed Structures: Fat Layer (Subcutaneous Tissue): N/A N/A Yes Fascia: No Tendon: No Muscle: No Joint: No Bone: No Epithelialization: None N/A N/A Treatment Notes Electronic Signature(s) Signed: 04/04/2021 10:59:22 AM By: Dawn Coria RN Entered By: [name] on 04/04/2021 10:59:22 [name] ([id number]) -------------------------------------------------------------------------------- Helen Details Patient Name: [name] Date of Service: 04/04/2021 10:15 AM Medical Record Number: [id number] Patient Account Number: [id number] Date of Birth/Sex: 06/06/1950 (70 y.o. F) Treating RN: [name] Primary Care Zayley Arras: [name], Dawn Mackie Other Clinician: Referring Edmund Holcomb: [name], Dawn Mackie Treating Oda Placke/Extender: [name] in Treatment: 40 Active Inactive Electronic Signature(s) Signed: 04/04/2021 10:59:03 AM By: Dawn Coria RN Entered By: [name] on 04/04/2021 10:59:02 [name]  ([id number]) -------------------------------------------------------------------------------- Pain Assessment Details Patient Name: [name] Date of Service: 04/04/2021 10:15 AM Medical Record Number: [id number] Patient Account Number: [id number] Date of Birth/Sex: 1950/08/12 (70 y.o. F) Treating RN: [name] Primary Care Marnie Fazzino: [name], Dawn Mackie Other Clinician: Referring Dyna Figuereo: [name], Dawn Mackie Treating Azriella Mattia/Extender: [name] in Treatment: 40 Active Problems Location of Pain Severity and Description of Pain Patient Has Paino No Site Locations Pain Management and Medication Current Pain Management: Electronic Signature(s) Signed: 04/04/2021 4:26:12 PM By: Dawn Coria RN Entered By: [name] on 04/04/2021 10:48:19 [name] ([id number]) -------------------------------------------------------------------------------- Patient/Caregiver Education Details Patient Name: [name] Date of Service: 04/04/2021 10:15 AM Medical Record Number: [id number] Patient Account Number: [id number] Date of Birth/Gender: July 18, 1950 (70 y.o. F) Treating RN: [name] Primary Care Physician: [name], Dawn Mackie Other Clinician: Referring Physician: [name], Dawn Mackie Treating Physician/Extender: [name] in Treatment: 53 Education Assessment Education Provided To: Patient Education Topics Provided Wound/Skin Impairment: Methods: Explain/Verbal Responses: State content correctly [employer])  Signed: 04/04/2021 4:26:12 PM By: Dawn Coria RN Entered By: [name] on 04/04/2021 11:14:09 [name] ([id number]) -------------------------------------------------------------------------------- Wound Assessment Details Patient Name: [name] Date of Service: 04/04/2021 10:15 AM Medical Record Number: [id number] Patient Account Number: [id number] Date of Birth/Sex: 1950-05-24 (70 y.o. F) Treating RN:  [name] Primary Care Roxie Kreeger: [name], Dawn Mackie Other Clinician: Referring Tonette Koehne: [name], Dawn Mackie Treating Vonya Ohalloran/Extender: [name] in Treatment: 40 Wound Status Wound Number: 16 Primary Diabetic Wound/Ulcer of the Lower Extremity Etiology: Wound Location: Left, Distal Toe Fourth Wound Open Wounding Event: Pressure Injury Status: Date Acquired: 10/31/2020 Notes: stated toenail was removed by podiatry doctor on Weeks Of Treatment: 18 11/08/20 Clustered Wound: No Comorbid Asthma, Pneumothorax, Arrhythmia, Hypertension, Type History: II Diabetes, Gout, Osteoarthritis, Neuropathy Photos Wound Measurements Length: (cm) Width: (cm) Depth: (cm) Area: (cm) Volume: (cm) 0 % Reduction in Area: 100% 0 % Reduction in Volume: 100% 0 Epithelialization: Large (67-100%) 0 Tunneling: No 0 Undermining: No Wound Description Classification: Grade 3 Wound Margin: Thickened Exudate Amount: None Present Foul Odor After Cleansing: No Slough/Fibrino No Wound Bed Granulation Amount: None Present (0%) Exposed Structure Necrotic Amount: None Present (0%) Fascia Exposed: No Fat Layer (Subcutaneous Tissue) Exposed: No Tendon Exposed: No Muscle Exposed: No Joint Exposed: No Bone Exposed: No Electronic Signature(s) Signed: 04/04/2021 4:26:12 PM By: Dawn Coria RN Entered By: [name] on 04/04/2021 10:57:52 Moulton, Dawn Reining ([id number]) -------------------------------------------------------------------------------- Wound Assessment Details Patient Name: [name] Date of Service: 04/04/2021 10:15 AM Medical Record Number: [id number] Patient Account Number: [id number] Date of Birth/Sex: 1950-04-25 (70 y.o. F) Treating RN: [name] Primary Care Yoland Scherr: [name], Dawn Mackie Other Clinician: Referring Dawit Tankard: [name], Dawn Mackie Treating Anjelica Gorniak/Extender: [name] in Treatment: 40 Wound Status Wound Number: 18 Primary Diabetic  Wound/Ulcer of the Lower Extremity Etiology: Wound Location: Right, Anterior Lower Leg Wound Open Wounding Event: Blister Status: Date Acquired: 04/01/2021 Comorbid Asthma, Pneumothorax, Arrhythmia, Hypertension, Type Weeks Of Treatment: 0 History: II Diabetes, Gout, Osteoarthritis, Neuropathy Clustered Wound: No Photos Wound Measurements Length: (cm) 0.5 % Re Width: (cm) 0.5 % Re Depth: (cm) 0.5 Tunn Area: (cm) 0.196 Und Volume: (cm) 0.098 duction in Area: duction in Volume: eling: No ermining: No Wound Description Classification: Grade 2 Foul Exudate Amount: Medium Slou Exudate Type: Serosanguineous Exudate Color: red, brown Odor After Cleansing: No gh/Fibrino Yes Wound Bed Granulation Amount: Medium (34-66%) Exposed Structure Granulation Quality: Pink Fascia Exposed: No Necrotic Amount: Medium (34-66%) Fat Layer (Subcutaneous Tissue) Exposed: Yes Necrotic Quality: Adherent Slough Tendon Exposed: No Muscle Exposed: No Joint Exposed: No Bone Exposed: No Treatment Notes Wound #18 (Lower Leg) Wound Laterality: Right, Anterior Cleanser Peri-Wound Care Topical Primary Dressing SHANTERA, MONTS B. ([id number]) Silvercel Small 2x2 (in/in) Discharge Instruction: Apply Silvercel Small 2x2 (in/in) as instructed Secondary Dressing ABD Pad 5x9 (in/in) Discharge Instruction: Cover with ABD pad Secured With Compression Wrap Profore Lite LF 3 Multilayer Compression Bandaging System Discharge Instruction: Apply 3 multi-layer wrap as prescribed. Compression Stockings Add-Ons Electronic Signature(s) Signed: 04/04/2021 4:26:12 PM By: Dawn Coria RN Entered By: [name] on 04/04/2021 10:52:43 MEGON, KALINA ([id number]) -------------------------------------------------------------------------------- Wound Assessment Details Patient Name: [name] Date of Service: 04/04/2021 10:15 AM Medical Record Number: [id number] Patient Account Number: [id number] Date  of Birth/Sex: [birth year] (70 y.o. F) Treating RN: [name] Primary Care Jamae Tison: [name], Dawn Mackie Other Clinician: Referring Diezel Mazur: [name], Dawn Mackie Treating Azeneth Carbonell/Extender: [name] in Treatment: 40 Wound Status Wound Number: 19 Primary Diabetic Wound/Ulcer of the Lower Extremity Etiology: Wound Location: Distal, Anterior Lower  Leg Wound Open Wounding Event: Blister Status: Date Acquired: 04/01/2021 Comorbid Asthma, Pneumothorax, Arrhythmia, Hypertension, Type Weeks Of Treatment: 0 History: II Diabetes, Gout, Osteoarthritis, Neuropathy Clustered Wound: No Photos Wound Measurements Length: (cm) 0.5 Width: (cm) 0.5 Depth: (cm) 0.1 Area: (cm) 0.196 Volume: (cm) 0.02 % Reduction in Area: % Reduction in Volume: Epithelialization: None Tunneling: No Undermining: No Wound Description Classification: Grade 2 Exudate Amount: Medium Exudate Type: Serosanguineous Exudate Color: red, brown Foul Odor After Cleansing: No Slough/Fibrino Yes Wound Bed Granulation Amount: Medium (34-66%) Exposed Structure Granulation Quality: Pink Fascia Exposed: No Necrotic Amount: Medium (34-66%) Fat Layer (Subcutaneous Tissue) Exposed: Yes Necrotic Quality: Adherent Slough Tendon Exposed: No Muscle Exposed: No Joint Exposed: No Bone Exposed: No Electronic Signature(s) Signed: 04/04/2021 4:26:12 PM By: Dawn Coria RN Entered By: [name] on 04/04/2021 10:54:34 Perrot, Dawn Reining ([id number]) -------------------------------------------------------------------------------- Wound Assessment Details Patient Name: [name] Date of Service: 04/04/2021 10:15 AM Medical Record Number: [id number] Patient Account Number: [id number] Date of Birth/Sex: January 28, 1951 (70 y.o. F) Treating RN: [name] Primary Care Yoona Ishii: [name], Dawn Mackie Other Clinician: Referring Jakaila Norment: [name], Dawn Mackie Treating Nesiah Jump/Extender: [name] in  Treatment: 40 Wound Status Wound Number: 20 Primary Diabetic Wound/Ulcer of the Lower Extremity Etiology: Wound Location: Left, Proximal, Anterior Lower Leg Wound Open Wounding Event: Blister Status: Date Acquired: 03/30/2021 Comorbid Asthma, Pneumothorax, Arrhythmia, Hypertension, Type Weeks Of Treatment: 0 History: II Diabetes, Gout, Osteoarthritis, Neuropathy Clustered Wound: No Photos Wound Measurements Length: (cm) 2 Width: (cm) 3 Depth: (cm) 0.1 Area: (cm) 4.712 Volume: (cm) 0.471 % Reduction in Area: % Reduction in Volume: Epithelialization: None Tunneling: No Undermining: No Wound Description Classification: Grade 2 Exudate Amount: Medium Exudate Type: Serosanguineous Exudate Color: red, brown Foul Odor After Cleansing: No Slough/Fibrino Yes Wound Bed Granulation Amount: Medium (34-66%) Exposed Structure Granulation Quality: Pink Fascia Exposed: No Necrotic Amount: Medium (34-66%) Fat Layer (Subcutaneous Tissue) Exposed: Yes Necrotic Quality: Adherent Slough Tendon Exposed: No Muscle Exposed: No Joint Exposed: No Bone Exposed: No Electronic Signature(s) Signed: 04/04/2021 4:26:12 PM By: Dawn Coria RN Entered By: [name] on 04/04/2021 10:56:05 [name] ([id number]) -------------------------------------------------------------------------------- Vitals Details Patient Name: [name] Date of Service: 04/04/2021 10:15 AM Medical Record Number: [id number] Patient Account Number: [id number] Date of Birth/Sex: [birth year] (70 y.o. F) Treating RN: [name] Primary Care Secret Kristensen: [name], Dawn Mackie Other Clinician: Referring Marcellis Frampton: [name], Dawn Mackie Treating Tally Mattox/Extender: [name] in Treatment: 40 Vital Signs Time Taken: 10:47 Temperature (F): 98.2 Height (in): 65.5 Pulse (bpm): 74 Weight (lbs): 240 Respiratory Rate (breaths/min): 20 Body Mass Index (BMI): 39.3 Blood Pressure (mmHg): 146/87 Reference  Range: 80 - 120 mg / dl Electronic Signature(s) Signed: 04/04/2021 4:26:12 PM By: Dawn Coria RN Entered By: [name] on 04/04/2021 10:48:07

## 2021-04-04 NOTE — Progress Notes (Addendum)
[name], [name] ([id number]) Visit Report for 04/04/2021 Chief Complaint Document Details Patient Name: Dawn, [name] Date of Service: 04/04/2021 10:15 AM Medical Record Number: [id number] Patient Account Number: [id number] Date of Birth/Sex: 10-16-1950 (71 y.o. F) Treating RN: Carlene Coria Primary Care Provider: [name], Olivia Mackie Other Clinician: Referring Provider: [name], Olivia Mackie Treating Provider/Extender: Skipper Cliche in Treatment: 59 Information Obtained from: Patient Chief Complaint Bilateral Leg Ulcers and Right 4thToe Ulcer Electronic Signature(s) Signed: 04/04/2021 11:02:39 AM By: Worthy Keeler PA-C Previous Signature: 04/04/2021 10:56:40 AM Version By: Worthy Keeler PA-C Entered By: Worthy Keeler on 04/04/2021 11:02:39 PHILLIS, THACKERAY ([id number]) -------------------------------------------------------------------------------- HPI Details Patient Name: [name] Date of Service: 04/04/2021 10:15 AM Medical Record Number: [id number] Patient Account Number: [id number] Date of Birth/Sex: 1950-08-28 (70 y.o. F) Treating RN: Carlene Coria Primary Care Provider: [name], Olivia Mackie Other Clinician: Referring Provider: [name], Olivia Mackie Treating Provider/Extender: Skipper Cliche in Treatment: 72 History of Present Illness HPI Description: 06/28/20 upon evaluation today patient appears to be doing unfortunately somewhat poorly in regard to wounds that she has over her lower extremities. She has a wound on the left distal second toe, left posterior heel, and right anterior lower leg. With that being said all in all none of the wounds appear to be too bad the one that hurts her the most is actually the wound on the posterior heel which actually is also one of the smallest. Nonetheless I think there is some callus hiding what is really going on in this area. Fortunately there does not appear to be any obvious evidence of infection which is great  news. Patient does have a history of diabetes mellitus type 2, chronic venous insufficiency, hypertension, history of DVTs, and is on long-term anticoagulant therapy, Eliquis. 07/05/20 upon evaluation today patient appears to be doing well currently with regard to her wounds. I feel like she is making progress which is great news and overall there is no signs of active infection at this time. No fevers, chills, nausea, vomiting, or diarrhea. 4/29; patient has a only a small area remaining on the tip of her left second toe and a small area remaining on the left heel. The area on the right anterior lower leg is healed. They tell me that she had remote podiatric surgery on the toes of the left foot however they are very fixed in position and I wonder whether they are making it difficult to relieve pressure in her foot wear. She is a type II diabetic with neuropathy as well. 08/01/2020 upon evaluation today patient appears to be doing well currently in regard to her wounds. In fact everything appears to be doing much better. The posterior aspect of her heel is actually giving her some trouble not the Achilles area but a small wound that we did not even have marked. In fact I had noted this before. Nonetheless we are going to addressed that today. 5/27; most of the patient's wounds are healed including the left Achilles heel left toe. Right forearm is also healed. She has a new abrasion posteriorly in the right elbow area just above the olecranon this happened today with an abrasion from her car door 6/15; the patient's wounds on the left Achilles and left second toe remain healed. The area on the right forearm is also just about healed in fact the skin I replaced last time is looking viable which is good. She had me look at the left heel again which is not in the area  of the wound more towards the tip of the heel at the Achilles insertion site as well as the tip of the left foot second toe. In the Achilles  area that is clearly is friction probably in bed at night. Not really near where the wound was. The left second toe remains healed although this is hammered and overhanging first toe also puts pressure in this area 09/13/2020 upon evaluation today patient appears to be doing well with regard to all previous wounds that she had. Everything is completely healed. With that being said I do think that the patient unfortunately has a new skin tear on the right forearm which is due to her put a Band-Aid on it and then when it pulled off this caused some issues with skin tearing. Nonetheless I think that this needs to be addressed today. Hopefully will take too long to get this to heal. She is having some issues with pain in her heel but I think this is more related to be honest to her neuropathy as opposed to anything else. Readmission: 10/30/2020 upon evaluation today patient appears to be doing poorly in regard to her legs bilaterally. She is also having an issue with her fourth toe on the left. Fortunately there does not appear to be any signs of active infection at this time. No fevers, chills, nausea, vomiting, or diarrhea. It is actually been quite a while since we have seen her. In fact June 24 was the last time that she was here in the clinic. She tells me she did not come back because things were just doing "pretty well". 11/29/2020 upon evaluation today patient actually appears to be doing well with regard to her arms those are completely healed. Her right leg is still open but doing okay. The fourth toe of the left foot does show some signs here of having some issues with necrotic tissue of an open wound. This is where a toenail was removed by podiatry. Subsequently I do think that we need to go ahead and see what we can do about getting this cleared away so being try to get some healing going forward. 12/13/2020 upon evaluation today patient appears to be doing decently well in regard to her wound  on the right leg. In general I think that she is making good progress here. Unfortunately she continues to have significant issues with swelling in the left leg is now even more swollen at this point. For that reason I do believe she would be benefited by wrapping both sides although she is not interested in doing this. Overall I think that she is definitely making some good progress here nonetheless in regard to the right leg left leg leave some to be desired. 12/27/2020 upon evaluation today patient appears to be doing well with regard to her wound in regard to the legs. Unfortunately the toe was not doing nearly as well. I am much more concerned about infection and osteomyelitis here. She had the MRI but right now were not seeing obvious evidence of redness spreading up to her foot this is good news I do not have the result of the MRI as of yet. I am good to place her on doxycycline however due to the fact that the wound does seem to be getting a little worse in regard to her toe. 01/14/2021 upon evaluation today patient appears to be doing well with regard to her ulcers. The right leg is doing well and even the toe seems to be doing  well though she still has some evidence of bone exposed on the toe which does not appear to be doing quite as good as I would like to see just and the fact that is still not covered over new tissue but looks tremendously better compared to 2 weeks ago. Overall very pleased in that regard. 01/28/2021 upon evaluation today patient's leg on the left actually appears to have new wounds this is due to her deciding to shave yesterday. Fortunately there does not appear to be any signs of infection currently. This is good news. Nonetheless I do believe that with these new areas open it would be best to wrap her leg to try to make sure we get this under control sooner rather than later. With regard to her toe there still does appear to be some evidence of bone exposed this was  covered over with callus I did remove that today. Nonetheless I think this is significantly smaller than previous. 02/11/2021 upon evaluation today patient appears to be doing okay in regard to her leg ulcers. I do not see anything obvious at this point this seems to be a complicating factor. I think that she is getting better. Were wrap of the right leg not the left leg. With that being said the left fourth Germer, Aalyiah B. ([id number]) toe ulcer still does have evidence of being open there is some drainage still on top of the wound I think we can to try to see about using something like a collagen dressing today to see if that would be of benefit for her. She is in agreement with giving this a trial. Nonetheless I am concerned about the fact that she continues to have issues here with this not healing it is confirmed chronic osteomyelitis she has been on antibiotics. I am going to extend those today as well. That is Keflex and that will be for 1 additional month. Nonetheless if she is not doing significantly better by the next time I see her I think that the biggest thing is going to be for [place] to go ahead and see what we can do about getting her started with hyperbaric oxygen therapy to try to save the toe. 02/20/2021 upon evaluation today patient presents for follow-up she actually came in a little bit early as the home health nurse was concerned about her toe becoming "gangrenous". With that being said the good news is there is [place] no evidence of this and in fact the toe seems to be doing better at this point. What was over the toe was an area that had bled significantly and then subsequently dried making it look much worse than where things actually stand. There is just a very small opening remaining in the toe. In regard to her legs everything appears to be healed bilaterally. 02/24/2021 upon evaluation today patient appears to be doing well with regard to her toe ulcer. I am actually very  pleased with where we stand and I think that she is making good progress at this time. There is still a very small opening although I think the biggest issue is the collagen seems to be getting stuck and drying up because the wound is so small we probably just need to use something to keep this moist like a little piece of Xeroform gauze and see if we get this to close. 03/07/2021 upon evaluation patient actually appears to be making excellent progress in regard to her last wound on the toe. There is just a very  tiny potential area that I can even be sure at first until I get some of the dry skin off when I did get this removed there was just a very tiny pinpoint opening that was barely even moist. Actually feel like this is very close to complete resolution. She has been using the Xeroform that is doing great. 03/25/2021 upon evaluation today patient appears to be doing well with regard to her toe ulcer. In fact this appears to be potentially completely healed. Fortunately I do not see anything draining at this point which is great news. She does have a small blood blister that is already drying up and looking okay on the third toe as well I am not sure what happened here she has an either. Otherwise she is having some weeping from the right leg this again is newer she tells me from last week weighing in that her pulmonologist and then today weighing in at the hematologist that she gained 30 pounds. This is quite significant if indeed true and I think she is to contact her primary care provider ASAP. This would account for why her right leg is weeping as well. 04/04/2020 upon evaluation today patient appears to be doing excellent in regard to her toe which I actually think is healed today. With that being said she unfortunately is having issues with her leg swelling and again several blisters on each leg that have reopened unfortunately. Electronic Signature(s) Signed: 04/04/2021 11:36:12 AM By: Worthy Keeler PA-C Entered By: Worthy Keeler on 04/04/2021 11:36:11 KARYSSA, AMARAL ([id number]) -------------------------------------------------------------------------------- Physical Exam Details Patient Name: [name] Date of Service: 04/04/2021 10:15 AM Medical Record Number: [id number] Patient Account Number: [id number] Date of Birth/Sex: 1950-11-06 (70 y.o. F) Treating RN: Carlene Coria Primary Care Provider: [name], Olivia Mackie Other Clinician: Referring Provider: [name], Olivia Mackie Treating Provider/Extender: Skipper Cliche in Treatment: 60 Constitutional Well-nourished and well-hydrated in no acute distress. Respiratory normal breathing without difficulty. Psychiatric this patient is able to make decisions and demonstrates good insight into disease process. Alert and Oriented x 3. pleasant and cooperative. Notes Upon inspection patient's wound bed actually showed signs of good granulation and epithelization at this point. Fortunately I do not see any evidence of active infection locally nor systemically and overall I think that she is doing well in that regard. Nonetheless this is simply just to be something that is an ongoing issue most likely for her. She wants to know what she can do to "get rid of all the swelling". I explained that this may not be something that can be completely gotten rid of but more managed although I would recommend that she discuss this with her doctors this coming week she is seeing Dr. Mariea Clonts among others. Electronic Signature(s) Signed: 04/04/2021 11:36:50 AM By: Worthy Keeler PA-C Entered By: Worthy Keeler on 04/04/2021 11:36:50 Garron, [name] ([id number]) -------------------------------------------------------------------------------- Physician Orders Details Patient Name: [name] Date of Service: 04/04/2021 10:15 AM Medical Record Number: [id number] Patient Account Number: [id number] Date of Birth/Sex: [birth year]  (70 y.o. F) Treating RN: Carlene Coria Primary Care Provider: [name], Olivia Mackie Other Clinician: Referring Provider: [name], Olivia Mackie Treating Provider/Extender: Skipper Cliche in Treatment: 23 Verbal / Phone Orders: No Diagnosis Coding ICD-10 Coding Code Description E11.621 Type 2 diabetes mellitus with foot ulcer L97.522 Non-pressure chronic ulcer of other part of left foot with fat layer exposed I87.2 Venous insufficiency (chronic) (peripheral) L97.812 Non-pressure chronic ulcer of other part of right lower leg with fat layer  exposed L97.822 Non-pressure chronic ulcer of other part of left lower leg with fat layer exposed Z79.01 Long term (current) use of anticoagulants Follow-up Appointments o Return Appointment in 1 week. o Nurse Visit as needed [place] for wound care. May utilize formulary equivalent dressing for wound treatment orders unless otherwise specified. Home Health Nurse may visit [facility] to address patientos wound care needs. - home health to see on tuesday and we will see on friday o Scheduled days for dressing changes to be completed; exception, patient has scheduled wound care visit that day. o **Please direct any NON-WOUND related issues/requests for orders to patient's Primary Care Physician. **If current dressing causes regression in wound condition, may D/C ordered dressing product/s and apply Normal Saline Moist Dressing daily until next [place] or Other MD appointment. **Notify [facility] of regression in wound condition at [phone]. Bathing/ Shower/ Hygiene o May shower with wound dressing protected with water repellent cover or cast protector. o No tub bath. o Other: - Be careful of new skin to protect-do not shave Anesthetic (Use 'Patient Medications' Section for Anesthetic Order Entry) o Lidocaine applied to wound bed Edema Control -  Lymphedema / Segmental Compressive Device / Other o Patient to wear own compression stockings. Remove compression stockings every night before going to bed and put on every morning when getting up. o Elevate, Exercise Daily and Avoid Standing for Long Periods of Time. o Elevate legs to the level of the heart and pump ankles as often as possible o Elevate leg(s) parallel to the floor when sitting. o DO YOUR BEST to sleep in the bed at night. DO NOT sleep in your recliner. Long hours of sitting in a recliner leads to swelling of the legs and/or potential wounds on your backside. Non-Wound Condition o Additional non-wound orders/instructions: - apply Betadine to left 2nd toe cover open areas right lower leg with silver alginate and cover with dressing as needed until stops leaking/ABD pad or kerlix CALL PULMONARY OR PCP FOR ORDERS OF GAINING WEIGHT Additional Orders / Instructions o Follow Nutritious Diet and Increase Protein Intake o Other: - Leave Vaseline gauze over steri strips to right forearm skin tear noted prior to visit; keep protected and allow to fall off and protect skin Wound Treatment Wound #18 - Lower Leg Wound Laterality: Right, Anterior Primary Dressing: Silvercel Small 2x2 (in/in) 2 x Per Week/30 Days Discharge Instructions: Apply Silvercel Small 2x2 (in/in) as instructed Curvin, Aneira B. ([id number]) Secondary Dressing: ABD Pad 5x9 (in/in) 2 x Per Week/30 Days Discharge Instructions: Cover with ABD pad Compression Wrap: Profore Lite LF 3 Multilayer Compression Bandaging System 2 x Per Week/30 Days Discharge Instructions: Apply 3 multi-layer wrap as prescribed. Wound #19 - Lower Leg Wound Laterality: Anterior, Distal Primary Dressing: Silvercel Small 2x2 (in/in) 2 x Per Week/30 Days Discharge Instructions: Apply Silvercel Small 2x2 (in/in) as instructed Secondary Dressing: ABD Pad 5x9 (in/in) 2 x Per Week/30 Days Discharge Instructions: Cover with ABD  pad Compression Wrap: Profore Lite LF 3 Multilayer Compression Bandaging System 2 x Per Week/30 Days Discharge Instructions: Apply 3 multi-layer wrap as prescribed. Wound #20 - Lower Leg Wound Laterality: Left, Anterior, Proximal Primary Dressing: Silvercel Small 2x2 (in/in) 2 x Per Week/30 Days Discharge Instructions: Apply Silvercel Small 2x2 (in/in) as instructed Secondary Dressing: ABD Pad 5x9 (in/in) 2 x Per Week/30 Days Discharge Instructions: Cover with ABD pad Compression Wrap: Profore Lite LF 3 Multilayer Compression Bandaging  System 2 x Per Week/30 Days Discharge Instructions: Apply 3 multi-layer wrap as prescribed. Electronic Signature(s) Signed: 04/04/2021 4:26:12 PM By: Carlene Coria RN Signed: 04/04/2021 5:59:23 PM By: Worthy Keeler PA-C Entered By: Carlene Coria on 04/04/2021 11:13:04 CYNDEL, GRIFFEY ([id number]) -------------------------------------------------------------------------------- Problem List Details Patient Name: [name] Date of Service: 04/04/2021 10:15 AM Medical Record Number: [id number] Patient Account Number: [id number] Date of Birth/Sex: 1950-11-18 (70 y.o. F) Treating RN: Carlene Coria Primary Care Provider: [name], Olivia Mackie Other Clinician: Referring Provider: [name], Olivia Mackie Treating Provider/Extender: Skipper Cliche in Treatment: 67 Active Problems ICD-10 Encounter Code Description Active Date MDM Diagnosis E11.621 Type 2 diabetes mellitus with foot ulcer 06/28/2020 No Yes L97.522 Non-pressure chronic ulcer of other part of left foot with fat layer 06/28/2020 No Yes exposed I87.2 Venous insufficiency (chronic) (peripheral) 06/28/2020 No Yes L97.812 Non-pressure chronic ulcer of other part of right lower leg with fat layer 06/28/2020 No Yes exposed L97.822 Non-pressure chronic ulcer of other part of left lower leg with fat layer 02/11/2021 No Yes exposed Z79.01 Long term (current) use of anticoagulants 06/28/2020 No Yes Inactive  Problems ICD-10 Code Description Active Date Inactive Date I10 Essential (primary) hypertension 06/28/2020 06/28/2020 Z86.718 Personal history of other venous thrombosis and embolism 06/28/2020 06/28/2020 Resolved Problems ICD-10 Code Description Active Date Resolved Date L97.322 Non-pressure chronic ulcer of left ankle with fat layer exposed 06/28/2020 06/28/2020 S50.811A Abrasion of right forearm, initial encounter 10/30/2020 10/30/2020 S50.311D Abrasion of right elbow, subsequent encounter 08/16/2020 08/16/2020 KARYSS, FRESE ([id number]) [phone] Other chronic hematogenous osteomyelitis, left ankle and foot 02/11/2021 02/11/2021 Electronic Signature(s) Signed: 04/04/2021 11:02:16 AM By: Worthy Keeler PA-C Previous Signature: 04/04/2021 10:56:33 AM Version By: Worthy Keeler PA-C Entered By: Worthy Keeler on 04/04/2021 11:02:16 AMI, MALLY ([id number]) -------------------------------------------------------------------------------- Progress Note Details Patient Name: [name] Date of Service: 04/04/2021 10:15 AM Medical Record Number: [id number] Patient Account Number: [id number] Date of Birth/Sex: [birth year] (70 y.o. F) Treating RN: Carlene Coria Primary Care Provider: [name], Olivia Mackie Other Clinician: Referring Provider: [name], Olivia Mackie Treating Provider/Extender: Skipper Cliche in Treatment: 76 Subjective Chief Complaint Information obtained from Patient Bilateral Leg Ulcers and Right 4thToe Ulcer History of Present Illness (HPI) 06/28/20 upon evaluation today patient appears to be doing unfortunately somewhat poorly in regard to wounds that she has over her lower extremities. She has a wound on the left distal second toe, left posterior heel, and right anterior lower leg. With that being said all in all none of the wounds appear to be too bad the one that hurts her the most is actually the wound on the posterior heel which actually is also one of the smallest.  Nonetheless I think there is some callus hiding what is really going on in this area. Fortunately there does not appear to be any obvious evidence of infection which is great news. Patient does have a history of diabetes mellitus type 2, chronic venous insufficiency, hypertension, history of DVTs, and is on long-term anticoagulant therapy, Eliquis. 07/05/20 upon evaluation today patient appears to be doing well currently with regard to her wounds. I feel like she is making progress which is great news and overall there is no signs of active infection at this time. No fevers, chills, nausea, vomiting, or diarrhea. 4/29; patient has a only a small area remaining on the tip of her left second toe and a small area remaining on the left heel. The area on the right anterior lower leg is healed. They tell me that  she had remote podiatric surgery on the toes of the left foot however they are very fixed in position and I wonder whether they are making it difficult to relieve pressure in her foot wear. She is a type II diabetic with neuropathy as well. 08/01/2020 upon evaluation today patient appears to be doing well currently in regard to her wounds. In fact everything appears to be doing much better. The posterior aspect of her heel is actually giving her some trouble not the Achilles area but a small wound that we did not even have marked. In fact I had noted this before. Nonetheless we are going to addressed that today. 5/27; most of the patient's wounds are healed including the left Achilles heel left toe. Right forearm is also healed. She has a new abrasion posteriorly in the right elbow area just above the olecranon this happened today with an abrasion from her car door 6/15; the patient's wounds on the left Achilles and left second toe remain healed. The area on the right forearm is also just about healed in fact the skin I replaced last time is looking viable which is good. She had me look at the left  heel again which is not in the area of the wound more towards the tip of the heel at the Achilles insertion site as well as the tip of the left foot second toe. In the Achilles area that is clearly is friction probably in bed at night. Not really near where the wound was. The left second toe remains healed although this is hammered and overhanging first toe also puts pressure in this area 09/13/2020 upon evaluation today patient appears to be doing well with regard to all previous wounds that she had. Everything is completely healed. With that being said I do think that the patient unfortunately has a new skin tear on the right forearm which is due to her put a Band-Aid on it and then when it pulled off this caused some issues with skin tearing. Nonetheless I think that this needs to be addressed today. Hopefully will take too long to get this to heal. She is having some issues with pain in her heel but I think this is more related to be honest to her neuropathy as opposed to anything else. Readmission: 10/30/2020 upon evaluation today patient appears to be doing poorly in regard to her legs bilaterally. She is also having an issue with her fourth toe on the left. Fortunately there does not appear to be any signs of active infection at this time. No fevers, chills, nausea, vomiting, or diarrhea. It is actually been quite a while since we have seen her. In fact June 24 was the last time that she was here in the clinic. She tells me she did not come back because things were just doing "pretty well". 11/29/2020 upon evaluation today patient actually appears to be doing well with regard to her arms those are completely healed. Her right leg is still open but doing okay. The fourth toe of the left foot does show some signs here of having some issues with necrotic tissue of an open wound. This is where a toenail was removed by podiatry. Subsequently I do think that we need to go ahead and see what we can do  about getting this cleared away so being try to get some healing going forward. 12/13/2020 upon evaluation today patient appears to be doing decently well in regard to her wound on the right leg. In  general I think that she is making good progress here. Unfortunately she continues to have significant issues with swelling in the left leg is now even more swollen at this point. For that reason I do believe she would be benefited by wrapping both sides although she is not interested in doing this. Overall I think that she is definitely making some good progress here nonetheless in regard to the right leg left leg leave some to be desired. 12/27/2020 upon evaluation today patient appears to be doing well with regard to her wound in regard to the legs. Unfortunately the toe was not doing nearly as well. I am much more concerned about infection and osteomyelitis here. She had the MRI but right now were not seeing obvious evidence of redness spreading up to her foot this is good news I do not have the result of the MRI as of yet. I am good to place her on doxycycline however due to the fact that the wound does seem to be getting a little worse in regard to her toe. 01/14/2021 upon evaluation today patient appears to be doing well with regard to her ulcers. The right leg is doing well and even the toe seems to be doing well though she still has some evidence of bone exposed on the toe which does not appear to be doing quite as good as I would like to see just and the fact that is still not covered over new tissue but looks tremendously better compared to 2 weeks ago. Overall very pleased in that regard. 01/28/2021 upon evaluation today patient's leg on the left actually appears to have new wounds this is due to her deciding to shave yesterday. Fortunately there does not appear to be any signs of infection currently. This is good news. Nonetheless I do believe that with these new areas open it would be best to  wrap her leg to try to make sure we get this under control sooner rather than later. With regard to her toe there still does Crickenberger, Kanyla B. ([id number]) appear to be some evidence of bone exposed this was covered over with callus I did remove that today. Nonetheless I think this is significantly smaller than previous. 02/11/2021 upon evaluation today patient appears to be doing okay in regard to her leg ulcers. I do not see anything obvious at this point this seems to be a complicating factor. I think that she is getting better. Were wrap of the right leg not the left leg. With that being said the left fourth toe ulcer still does have evidence of being open there is some drainage still on top of the wound I think we can to try to see about using something like a collagen dressing today to see if that would be of benefit for her. She is in agreement with giving this a trial. Nonetheless I am concerned about the fact that she continues to have issues here with this not healing it is confirmed chronic osteomyelitis she has been on antibiotics. I am going to extend those today as well. That is Keflex and that will be for 1 additional month. Nonetheless if she is not doing significantly better by the next time I see her I think that the biggest thing is going to be for [place] to go ahead and see what we can do about getting her started with hyperbaric oxygen therapy to try to save the toe. 02/20/2021 upon evaluation today patient presents for follow-up she actually came in  a little bit early as the home health nurse was concerned about her toe becoming "gangrenous". With that being said the good news is there is [place] no evidence of this and in fact the toe seems to be doing better at this point. What was over the toe was an area that had bled significantly and then subsequently dried making it look much worse than where things actually stand. There is just a very small opening remaining in the toe. In  regard to her legs everything appears to be healed bilaterally. 02/24/2021 upon evaluation today patient appears to be doing well with regard to her toe ulcer. I am actually very pleased with where we stand and I think that she is making good progress at this time. There is still a very small opening although I think the biggest issue is the collagen seems to be getting stuck and drying up because the wound is so small we probably just need to use something to keep this moist like a little piece of Xeroform gauze and see if we get this to close. 03/07/2021 upon evaluation patient actually appears to be making excellent progress in regard to her last wound on the toe. There is just a very tiny potential area that I can even be sure at first until I get some of the dry skin off when I did get this removed there was just a very tiny pinpoint opening that was barely even moist. Actually feel like this is very close to complete resolution. She has been using the Xeroform that is doing great. 03/25/2021 upon evaluation today patient appears to be doing well with regard to her toe ulcer. In fact this appears to be potentially completely healed. Fortunately I do not see anything draining at this point which is great news. She does have a small blood blister that is already drying up and looking okay on the third toe as well I am not sure what happened here she has an either. Otherwise she is having some weeping from the right leg this again is newer she tells me from last week weighing in that her pulmonologist and then today weighing in at the hematologist that she gained 30 pounds. This is quite significant if indeed true and I think she is to contact her primary care provider ASAP. This would account for why her right leg is weeping as well. 04/04/2020 upon evaluation today patient appears to be doing excellent in regard to her toe which I actually think is healed today. With that being said she  unfortunately is having issues with her leg swelling and again several blisters on each leg that have reopened unfortunately. Objective Constitutional Well-nourished and well-hydrated in no acute distress. Vitals Time Taken: 10:47 AM, Height: 65.5 in, Weight: 240 lbs, BMI: 39.3, Temperature: 98.2 F, Pulse: 74 bpm, Respiratory Rate: 20 breaths/min, Blood Pressure: 146/87 mmHg. Respiratory normal breathing without difficulty. Psychiatric this patient is able to make decisions and demonstrates good insight into disease process. Alert and Oriented x 3. pleasant and cooperative. General Notes: Upon inspection patient's wound bed actually showed signs of good granulation and epithelization at this point. Fortunately I do not see any evidence of active infection locally nor systemically and overall I think that she is doing well in that regard. Nonetheless this is simply just to be something that is an ongoing issue most likely for her. She wants to know what she can do to "get rid of all the swelling". I explained that this  may not be something that can be completely gotten rid of but more managed although I would recommend that she discuss this with her doctors this coming week she is seeing Dr. Mariea Clonts among others. Integumentary (Hair, Skin) Wound #16 status is Open. Original cause of wound was Pressure Injury. The date acquired was: 10/31/2020. The wound has been in treatment 18 weeks. The wound is located on the Left,Distal Toe Fourth. The wound measures 0cm length x 0cm width x 0cm depth; 0cm^2 area and 0cm^3 volume. There is no tunneling or undermining noted. There is a none present amount of drainage noted. The wound margin is thickened. There is no granulation within the wound bed. There is no necrotic tissue within the wound bed. Wound #18 status is Open. Original cause of wound was Blister. The date acquired was: 04/01/2021. The wound is located on the Right,Anterior Lower Leg. The wound  measures 0.5cm length x 0.5cm width x 0.5cm depth; 0.196cm^2 area and 0.098cm^3 volume. There is Fat Layer (Subcutaneous Tissue) exposed. There is no tunneling or undermining noted. There is a medium amount of serosanguineous drainage noted. There is medium (34-66%) pink granulation within the wound bed. There is a medium (34-66%) amount of necrotic tissue within the wound bed Kirt, Jermika B. ([id number]) including [place]. Wound #19 status is Open. Original cause of wound was Blister. The date acquired was: 04/01/2021. The wound is located on the Distal,Anterior Lower Leg. The wound measures 0.5cm length x 0.5cm width x 0.1cm depth; 0.196cm^2 area and 0.02cm^3 volume. There is Fat Layer (Subcutaneous Tissue) exposed. There is no tunneling or undermining noted. There is a medium amount of serosanguineous drainage noted. There is medium (34-66%) pink granulation within the wound bed. There is a medium (34-66%) amount of necrotic tissue within the wound bed including Adherent Slough. Wound #20 status is Open. Original cause of wound was Blister. The date acquired was: 03/30/2021. The wound is located on the Left,Proximal,Anterior Lower Leg. The wound measures 2cm length x 3cm width x 0.1cm depth; 4.712cm^2 area and 0.471cm^3 volume. There is Fat Layer (Subcutaneous Tissue) exposed. There is no tunneling or undermining noted. There is a medium amount of serosanguineous drainage noted. There is medium (34-66%) pink granulation within the wound bed. There is a medium (34-66%) amount of necrotic tissue within the wound bed including Adherent Slough. Assessment Active Problems ICD-10 Type 2 diabetes mellitus with foot ulcer Non-pressure chronic ulcer of other part of left foot with fat layer exposed Venous insufficiency (chronic) (peripheral) Non-pressure chronic ulcer of other part of right lower leg with fat layer exposed Non-pressure chronic ulcer of other part of left lower leg with fat  layer exposed Long term (current) use of anticoagulants Plan Follow-up Appointments: Return Appointment in 1 week. Nurse Visit as needed Home Health: [facility]: - [place] for wound care. May utilize formulary equivalent dressing for wound treatment orders unless otherwise specified. Home Health Nurse may visit PRN to address patient s wound care needs. - home health to see on tuesday and we will see on friday Scheduled days for dressing changes to be completed; exception, patient has scheduled wound care visit that day. **Please direct any NON-WOUND related issues/requests for orders to patient's Primary Care Physician. **If current dressing causes regression in wound condition, may D/C ordered dressing product/s and apply Normal Saline Moist Dressing daily until next [place] or Other MD appointment. **Notify [facility] of regression in wound condition at [phone]. Bathing/ Shower/ Hygiene:  May shower with wound dressing protected with water repellent cover or cast protector. No tub bath. Other: - Be careful of new skin to protect-do not shave Anesthetic (Use 'Patient Medications' Section for Anesthetic Order Entry): Lidocaine applied to wound bed Edema Control - Lymphedema / Segmental Compressive Device / Other: Patient to wear own compression stockings. Remove compression stockings every night before going to bed and put on every morning when getting up. Elevate, Exercise Daily and Avoid Standing for Long Periods of Time. Elevate legs to the level of the heart and pump ankles as often as possible Elevate leg(s) parallel to the floor when sitting. DO YOUR BEST to sleep in the bed at night. DO NOT sleep in your recliner. Long hours of sitting in a recliner leads to swelling of the legs and/or potential wounds on your backside. Non-Wound Condition: Additional non-wound orders/instructions: - apply Betadine to left 2nd toe cover  open areas right lower leg with silver alginate and cover with dressing as needed until stops leaking/ABD pad or kerlix CALL PULMONARY OR PCP FOR ORDERS OF GAINING WEIGHT Additional Orders / Instructions: Follow Nutritious Diet and Increase Protein Intake Other: - Leave Vaseline gauze over steri strips to right forearm skin tear noted prior to visit; keep protected and allow to fall off and protect skin WOUND #18: - Lower Leg Wound Laterality: Right, Anterior Primary Dressing: Silvercel Small 2x2 (in/in) 2 x Per Week/30 Days Discharge Instructions: Apply Silvercel Small 2x2 (in/in) as instructed Secondary Dressing: ABD Pad 5x9 (in/in) 2 x Per Week/30 Days Discharge Instructions: Cover with ABD pad Compression Wrap: Profore Lite LF 3 Multilayer Compression Bandaging System 2 x Per Week/30 Days Discharge Instructions: Apply 3 multi-layer wrap as prescribed. WOUND #19: - Lower Leg Wound Laterality: Anterior, Distal Primary Dressing: Silvercel Small 2x2 (in/in) 2 x Per Week/30 Days Discharge Instructions: Apply Silvercel Small 2x2 (in/in) as instructed Nachtigal, Ramiyah B. ([id number]) Secondary Dressing: ABD Pad 5x9 (in/in) 2 x Per Week/30 Days Discharge Instructions: Cover with ABD pad Compression Wrap: Profore Lite LF 3 Multilayer Compression Bandaging System 2 x Per Week/30 Days Discharge Instructions: Apply 3 multi-layer wrap as prescribed. WOUND #20: - Lower Leg Wound Laterality: Left, Anterior, Proximal Primary Dressing: Silvercel Small 2x2 (in/in) 2 x Per Week/30 Days Discharge Instructions: Apply Silvercel Small 2x2 (in/in) as instructed Secondary Dressing: ABD Pad 5x9 (in/in) 2 x Per Week/30 Days Discharge Instructions: Cover with ABD pad Compression Wrap: Profore Lite LF 3 Multilayer Compression Bandaging System 2 x Per Week/30 Days Discharge Instructions: Apply 3 multi-layer wrap as prescribed. 1. I would recommend that she discuss with Dr. Sophronia Simas if there is anything from her heart  standpoint that would be causing the swelling and if there is anything that can be done to help this. Otherwise I think that the biggest issue just can to be that she needs to work on trying to keep her swelling under control and that is probably can to be the most likely scenario. 2. I am also can recommend she should be using her compression stockings for now we will to wrap her today but unfortunately we cannot due to the fact that her pants literally are so tight and were unable to get the compression wraps on him. The patient placed understanding and it was in complete agreement that that is not can happen. With that being said we can have the home health nurse put the compression wraps on her as soon as she can see her this will not be today however  for now we did give her alginate to use underneath her compression stockings. We will see patient back for reevaluation in 1 week here in the clinic. If anything worsens or changes patient will contact our office for additional recommendations. Electronic Signature(s) Signed: 04/04/2021 11:39:02 AM By: Worthy Keeler PA-C Entered By: Worthy Keeler on 04/04/2021 11:39:02 NAYZETH, ALTMAN ([id number]) -------------------------------------------------------------------------------- SuperBill Details Patient Name: [name] Date of Service: 04/04/2021 Medical Record Number: [id number] Patient Account Number: [id number] Date of Birth/Sex: 01/04/1951 (70 y.o. F) Treating RN: Carlene Coria Primary Care Provider: [name], Olivia Mackie Other Clinician: Referring Provider: [name], Olivia Mackie Treating Provider/Extender: Skipper Cliche in Treatment: 40 Diagnosis Coding ICD-10 Codes Code Description E11.621 Type 2 diabetes mellitus with foot ulcer L97.522 Non-pressure chronic ulcer of other part of left foot with fat layer exposed I87.2 Venous insufficiency (chronic) (peripheral) L97.812 Non-pressure chronic ulcer of other part of  right lower leg with fat layer exposed L97.822 Non-pressure chronic ulcer of other part of left lower leg with fat layer exposed Z79.01 Long term (current) use of anticoagulants Facility Procedures The patient participates with Medicare or their insurance follows the Medicare Facility Guidelines: CPT4 Code Description Modifier Quantity [id number] [place] VISIT-LEV 4 EST PT 1 Physician Procedures CPT4 Code: 5997741 Description: 42395 - WC PHYS LEVEL 4 - EST PT Modifier: Quantity: 1 CPT4 Code: Description: ICD-10 Diagnosis Description E11.621 Type 2 diabetes mellitus with foot ulcer L97.522 Non-pressure chronic ulcer of other part of left foot with fat layer ex I87.2 Venous insufficiency (chronic) (peripheral) L97.812 Non-pressure chronic  ulcer of other part of right lower leg with fat la Modifier: posed yer exposed Quantity: Electronic Signature(s) Signed: 04/04/2021 11:42:40 AM By: Worthy Keeler PA-C Entered By: Worthy Keeler on 04/04/2021 11:42:39

## 2021-04-07 ENCOUNTER — Inpatient Hospital Stay: Payer: Medicare HMO

## 2021-04-07 ENCOUNTER — Telehealth: Payer: Self-pay

## 2021-04-07 NOTE — Telephone Encounter (Signed)
Spoke with patient's husband and we have rescheduled today's iron infusion 2 days after her last infusion on 1/30 (to make a total of 5). He will call back if patient is continuing to have issues and requires anymore scheduling changes.

## 2021-04-07 NOTE — Telephone Encounter (Signed)
Patient has an iron infusion scheduled for this afternoon. Husband called to cancel appointment due to incontinence issues. Has an appointment for Friday as well for infusion. He would like to keep that appointment since she has an appointment with urology tomorrow. Please reschedule today's IV iron appointment. Thank you!

## 2021-04-08 ENCOUNTER — Telehealth: Payer: Medicare HMO | Admitting: Oncology

## 2021-04-08 ENCOUNTER — Ambulatory Visit: Payer: Medicare HMO | Admitting: Obstetrics and Gynecology

## 2021-04-08 NOTE — Progress Notes (Deleted)
[facility] Return Visit  SUBJECTIVE  History of Present Illness: [name] is a 71 y.o. female seen in follow-up for OAB and SUI. She currently has an incontinence ring pessary in place. She has been using Gemtesa for OAB symptoms.     Past Medical History: Patient  has a past medical history of Abnormal antibody titer, Anxiety and depression, Arthritis, Asthma, Atypical chest pain, Carotid arterial disease ([place]), Cystocele, Depression, Diabetes ([place]), DVT (deep venous thrombosis) ([place]), Dyspnea, Edema, GERD (gastroesophageal reflux disease), Heart murmur, History of IBS, History of kidney stones, History of methicillin resistant staphylococcus aureus (MRSA) (2008), History of multiple strokes, Hyperlipidemia, Hypertension, Hypothyroidism, Insomnia, Kidney stone, Neuropathy involving both lower extremities, Non-healing ulcer of left foot ([place]), Obesity, Class II, BMI 35-39.9, with comorbidity, PAF (paroxysmal atrial fibrillation) ([place]), Peripheral vascular disease ([place]), Sleep apnea, Stroke ([place]), Urinary incontinence, and Venous stasis dermatitis of both lower extremities.   Past Surgical History: She  has a past surgical history that includes Ankle surgery (Right); Vaginal hysterectomy; Sleeve Gastroplasty; Lithotripsy; Kidney surgery (Right); transthoracic echocardiogram (08/03/2013); transesophageal echocardiogram (08/08/2013); Tonsillectomy; Total knee arthroplasty (Left); Hiatal hernia repair; Cholecystectomy; I & D extremity (Left, 06/27/2015); Application if wound vac (Left, 06/27/2015); removal of left hematoma (Left); [place] [facility] [place] HX) (February 2017); Reverse shoulder arthroplasty (Right, 10/08/2015); Hernia repair; Ulnar nerve transposition (Right, 08/06/2016); arm surgery; Irrigation and debridement abscess (Left, 09/07/2016); Incision and drainage of wound (Left, 09/25/2016); Application if wound vac (Left, 09/25/2016); Colonoscopy; Upper gi endoscopy;  Esophagogastroduodenoscopy (egd) with propofol (N/A, 01/11/2017); Colonoscopy with propofol (N/A, 01/11/2017); Colonoscopy with propofol (N/A, 02/22/2017); OTHER SURGICAL HISTORY; Total shoulder replacement (Right); Joint replacement (2013); Colonoscopy with propofol (N/A, 05/31/2017); Lower Extremity Angiography (Left, 04/04/2018); Esophagogastroduodenoscopy (egd) with propofol (N/A, 10/25/2018); Incision and drainage of wound (Left, 11/09/2018); and Esophagogastroduodenoscopy (egd) with propofol (N/A, 11/29/2019).   Medications: She has a current medication list which includes the following prescription(s): acetaminophen, albuterol, alprazolam, eliquis, benzonatate, bupropion, calcium-vitamin d, vitamin d3, clobetasol cream, colchicine, collagenase, diclofenac sodium, duloxetine, ergocalciferol, estradiol, fluconazole, fluconazole, advair hfa, folic acid, furosemide, hydrocodone-acetaminophen, hydrocodone-acetaminophen, [START ON 04/12/2021] hydrocodone-acetaminophen, levocetirizine, levothyroxine, losartan, metformin, metoprolol succinate, tub transfer board, montelukast, multivitamin with minerals, mupirocin ointment, ondansetron, pantoprazole, prednisone, pregabalin, rosuvastatin, sitagliptin, sucralfate, thiamine, gemtesa, and cyanocobalamin.   Allergies: Patient is allergic to morphine and related, penicillins, ambien [zolpidem], aspirin, and oxytetracycline.   Social History: Patient  reports that she has never smoked. She has never used smokeless tobacco. She reports that she does not drink alcohol and does not use drugs.      OBJECTIVE     Physical Exam: There were no vitals filed for this visit. Gen: No apparent distress, A&O x 3.  Detailed Urogynecologic Evaluation:  Deferred. Prior exam showed:  No flowsheet data found.     ASSESSMENT AND PLAN    [name] is a 71 y.o. with:  No diagnosis found.

## 2021-04-09 ENCOUNTER — Ambulatory Visit: Payer: Medicare HMO | Admitting: Nurse Practitioner

## 2021-04-10 ENCOUNTER — Ambulatory Visit: Payer: Medicare HMO | Admitting: Physician Assistant

## 2021-04-11 ENCOUNTER — Other Ambulatory Visit: Payer: Self-pay

## 2021-04-11 ENCOUNTER — Encounter: Payer: Self-pay | Admitting: Medical

## 2021-04-11 ENCOUNTER — Inpatient Hospital Stay: Payer: Medicare HMO

## 2021-04-11 ENCOUNTER — Ambulatory Visit
Admission: RE | Admit: 2021-04-11 | Discharge: 2021-04-11 | Disposition: A | Discharge status: Home / Self Care | Payer: Medicare HMO | Source: Ambulatory Visit | Attending: Adult Health | Admitting: Adult Health

## 2021-04-11 ENCOUNTER — Ambulatory Visit: Payer: Medicare HMO | Admitting: Medical

## 2021-04-11 VITALS — BP 170/72 | HR 57 | Ht 65.0 in | Wt 269.0 lb

## 2021-04-11 DIAGNOSIS — R059 Cough, unspecified: Secondary | ICD-10-CM | POA: Diagnosis not present

## 2021-04-11 DIAGNOSIS — R0609 Other forms of dyspnea: Secondary | ICD-10-CM

## 2021-04-11 DIAGNOSIS — I872 Venous insufficiency (chronic) (peripheral): Secondary | ICD-10-CM | POA: Diagnosis not present

## 2021-04-11 DIAGNOSIS — E1142 Type 2 diabetes mellitus with diabetic polyneuropathy: Secondary | ICD-10-CM | POA: Diagnosis not present

## 2021-04-11 DIAGNOSIS — I517 Cardiomegaly: Secondary | ICD-10-CM | POA: Diagnosis not present

## 2021-04-11 DIAGNOSIS — Z8673 Personal history of transient ischemic attack (TIA), and cerebral infarction without residual deficits: Secondary | ICD-10-CM | POA: Diagnosis not present

## 2021-04-11 DIAGNOSIS — E1152 Type 2 diabetes mellitus with diabetic peripheral angiopathy with gangrene: Secondary | ICD-10-CM | POA: Diagnosis not present

## 2021-04-11 DIAGNOSIS — I5032 Chronic diastolic (congestive) heart failure: Secondary | ICD-10-CM | POA: Diagnosis not present

## 2021-04-11 DIAGNOSIS — M86572 Other chronic hematogenous osteomyelitis, left ankle and foot: Secondary | ICD-10-CM | POA: Diagnosis not present

## 2021-04-11 DIAGNOSIS — I152 Hypertension secondary to endocrine disorders: Secondary | ICD-10-CM | POA: Diagnosis not present

## 2021-04-11 DIAGNOSIS — J4540 Moderate persistent asthma, uncomplicated: Secondary | ICD-10-CM | POA: Diagnosis not present

## 2021-04-11 DIAGNOSIS — E1159 Type 2 diabetes mellitus with other circulatory complications: Secondary | ICD-10-CM | POA: Diagnosis not present

## 2021-04-11 DIAGNOSIS — L97812 Non-pressure chronic ulcer of other part of right lower leg with fat layer exposed: Secondary | ICD-10-CM | POA: Diagnosis not present

## 2021-04-11 DIAGNOSIS — I10 Essential (primary) hypertension: Secondary | ICD-10-CM

## 2021-04-11 DIAGNOSIS — I4819 Other persistent atrial fibrillation: Secondary | ICD-10-CM | POA: Diagnosis not present

## 2021-04-11 DIAGNOSIS — J45909 Unspecified asthma, uncomplicated: Secondary | ICD-10-CM | POA: Diagnosis not present

## 2021-04-11 DIAGNOSIS — L97522 Non-pressure chronic ulcer of other part of left foot with fat layer exposed: Secondary | ICD-10-CM | POA: Diagnosis not present

## 2021-04-11 DIAGNOSIS — J449 Chronic obstructive pulmonary disease, unspecified: Secondary | ICD-10-CM | POA: Diagnosis not present

## 2021-04-11 DIAGNOSIS — R0602 Shortness of breath: Secondary | ICD-10-CM | POA: Diagnosis not present

## 2021-04-11 DIAGNOSIS — E11621 Type 2 diabetes mellitus with foot ulcer: Secondary | ICD-10-CM | POA: Diagnosis not present

## 2021-04-11 NOTE — Patient Instructions (Signed)
Medication Instructions:   Your physician recommends that you continue on your current medications as directed. Please refer to the Current Medication list given to you today.   *If you need a refill on your cardiac medications before your next appointment, please call your pharmacy*   Lab Work:  Your provider has ordered labs (BNP) to be drawn before you leave the office today.   If you have labs (blood work) drawn today and your tests are completely normal, you will receive your results only by: [place] (if you have MyChart) OR A paper copy in the mail If you have any lab test that is abnormal or we need to change your treatment, we will call you to review the results.   Testing/Procedures:  None ordered, however please go have the chest x-ray done that was ordered by the pulmonologist.   Follow-Up: At [facility], you and your health needs are our priority.  As part of our continuing mission to provide you with exceptional heart care, we have created designated Provider Care Teams.  These Care Teams include your primary Cardiologist (physician) and Advanced Practice Providers (APPs -  Physician Assistants and Nurse Practitioners) who all work together to provide you with the care you need, when you need it.  We recommend signing up for the patient portal called "MyChart".  Sign up information is provided on this After Visit Summary.  MyChart is used to connect with patients for Virtual Visits (Telemedicine).  Patients are able to view lab/test results, encounter notes, upcoming appointments, etc.  Non-urgent messages can be sent to your provider as well.   To learn more about what you can do with MyChart, go to [email or web address].    Your next appointment:   4-6 week(s)  The format for your next appointment:   In Person  Provider:   You may see Kathlyn Sacramento, MD or one of the following Advanced Practice Providers on your designated Care Team:   Murray Hodgkins, NP Christell Faith, PA-C Cadence Kathlen Mody, [place]  Other Instructions N/A

## 2021-04-11 NOTE — Progress Notes (Signed)
Cardiology Office Note:    Date:  04/11/2021   ID:  [name], DOB 1951/02/25, MRN [id number]  PCP:  [name], [name], MD  [facility] Cardiologist:  Kathlyn Sacramento, MD  [facility] Electrophysiologist:  None   Referring MD: [name], Olivia Mackie *   Chief Complaint: 4 month follow-up  History of Present Illness:    [name] is a 71 y.o. adult with a hx of paroxysmal atrial fibrillation, prior strokes, hypertension, hyperlipidemia, diabetes, obesity, chronic pain, DVT, sleep apnea on CPAP, and obesity, presents for follow-up.  She has a history of chronic left foot ulceration followed by vascular surgery.  Angiogram in 2020 showed no evidence of PAD and she subsequently underwent debridement with improvement.  In the setting of atypical chest pain, she underwent stress testing in January 2020, which was nonischemic.  In June 2021, she was admitted with pneumonia, sepsis, and mild troponin elevation to a peak of 170.  Echocardiogram showed an EF of 60 to 65% without regional wall motion abnormalities.  It was felt that troponin elevation was secondary to demand ischemia:.  She was admitted in late September 2021 with altered mental status, and left upper extremity weakness.  MRI of the brain showed bilateral small multiple acute strokes suggestive of embolic etiology.  She was on Pradaxa at that time and this was transitioned to Eliquis.  Unfortunately, she suffered another stroke in November 2021.  Neurology suspected recurrent strokes are due to severe malnutrition given gastric sleeve and noncompliance with vitamins.  In January 2022, she was admitted again for altered mental status and concern for stroke, though this was ruled out.  Echo with bubble study showed normal LV function and no evidence of ASD or PFO.  Last seen 02/06/21 and volume was better on lasix 20mg  daily. BB was decreased for HR in the 40s. Follow-up labs were stable.  Today, the patient reports  swelling is better in her lower legs, but notes some in her thighs.  Seems to be above the compression socks. She is taking lasix daily. Wondering about how to get rid of some of the fluid without the lasix since she is having trouble with making it to the bathroom. She wears compression socks, elevates her feet, and eats no salt.She is still going to the wound clinic. Wounds are not severe, they are worse with fluid.  She uses a walker. She is working on using a cane. No recent fall. No chest pain. She has shortness of breath that is the same.. BP is high, but this is abnormal for her.  No fever or chills. There is prior order for chest xray, however she has not had it done, recommended she get this done.    Past Medical History:  Diagnosis Date   Abnormal antibody titer    Anxiety and depression    Arthritis    Asthma    Atypical chest pain    a. 03/2018 MV: No ischemia/infarct. EF >65%.   Carotid arterial disease ([place])    a. 06/2874 U/S: RICA <81, LICA nl.   Cystocele    Depression    Diabetes ([place])    DVT (deep venous thrombosis) ([place])    Righ calf   Dyspnea    11/08/2018 - "more now due to lack of exercise"   Edema    GERD (gastroesophageal reflux disease)    Heart murmur    a. 08/2013 Echo: EF 60-65%, Mild MR (poss veg seen on TTE, not seen on TEE),  nl LV size with mod conc LVH. Mild LAE; b. 08/2019 Echo: EF 60-65%, no rwma. Nl RV size/fxn. No significant valvular dzs.   History of IBS    History of kidney stones    History of methicillin resistant staphylococcus aureus (MRSA) 2008   History of multiple strokes    Hyperlipidemia    Hypertension    Hypothyroidism    Insomnia    Kidney stone    kidney stones with lithotripsy .  [facility]- "adrenal glands"   Neuropathy involving both lower extremities    Non-healing ulcer of left foot (HCC)    Obesity, Class II, BMI 35-39.9, with comorbidity    PAF (paroxysmal atrial fibrillation) ([place])    a. 07/2013 Event monitor: Freq PVCs  and 3 short runs of Afib-->CHA2DS2VASc = 7-->Eliquis.   Peripheral vascular disease ([place])    a. 03/2018 LE Angio: Nl renal arteries. Nl aorta & iliacs. Nl LLE arterial flow; b. 06/2020 ABI's:   Sleep apnea    use C-PAP   Stroke ([place])    03/2020   Urinary incontinence    Venous stasis dermatitis of both lower extremities     Past Surgical History:  Procedure Laterality Date   ANKLE SURGERY Right    APPLICATION OF WOUND VAC Left 06/27/2015   Procedure: APPLICATION OF WOUND VAC ( POSSIBLE ) ;  Surgeon: [name], MD;  Location: [facility] ORS;  Service: Vascular;  Laterality: Left;   APPLICATION OF WOUND VAC Left 09/25/2016   Procedure: APPLICATION OF WOUND VAC;  Surgeon: [name], DPM;  Location: [facility] ORS;  Service: Podiatry;  Laterality: Left;   arm surgery     right     CHOLECYSTECTOMY     COLONOSCOPY     COLONOSCOPY WITH PROPOFOL N/A 01/11/2017   Procedure: COLONOSCOPY WITH PROPOFOL;  Surgeon: [name], MD;  Location: [facility] ENDOSCOPY;  Service: Endoscopy;  Laterality: N/A;   COLONOSCOPY WITH PROPOFOL N/A 02/22/2017   Procedure: COLONOSCOPY WITH PROPOFOL;  Surgeon: [name], MD;  Location: [facility] ENDOSCOPY;  Service: Endoscopy;  Laterality: N/A;   COLONOSCOPY WITH PROPOFOL N/A 05/31/2017   Procedure: COLONOSCOPY WITH PROPOFOL;  Surgeon: [name], MD;  Location: [facility] ENDOSCOPY;  Service: Endoscopy;  Laterality: N/A;   ESOPHAGOGASTRODUODENOSCOPY (EGD) WITH PROPOFOL N/A 01/11/2017   Procedure: ESOPHAGOGASTRODUODENOSCOPY (EGD) WITH PROPOFOL;  Surgeon: [name], MD;  Location: [facility] ENDOSCOPY;  Service: Endoscopy;  Laterality: N/A;   ESOPHAGOGASTRODUODENOSCOPY (EGD) WITH PROPOFOL N/A 10/25/2018   Procedure: ESOPHAGOGASTRODUODENOSCOPY (EGD) WITH PROPOFOL;  Surgeon: [name], MD;  Location[facility] ENDOSCOPY;  Service: Endoscopy;  Laterality: N/A;   ESOPHAGOGASTRODUODENOSCOPY (EGD) WITH PROPOFOL N/A 11/29/2019   Procedure: ESOPHAGOGASTRODUODENOSCOPY (EGD)  WITH PROPOFOL;  Surgeon: Toledo, Benay Pike, MD;  Location: [facility] ENDOSCOPY;  Service: Gastroenterology;  Laterality: N/A;   HERNIA REPAIR     umbilical   HIATAL HERNIA REPAIR     I & D EXTREMITY Left 06/27/2015   Procedure: IRRIGATION AND DEBRIDEMENT EXTREMITY            ( CALF HEMATOMA ) POSSIBLE WOUND VAC;  Surgeon: [name], MD;  Location: [facility] ORS;  Service: Vascular;  Laterality: Left;   INCISION AND DRAINAGE OF WOUND Left 09/25/2016   Procedure: IRRIGATION AND DEBRIDEMENT - PARTIAL RESECTION OF ACHILLES TENDON WITH WOUND VAC APPLICATION;  Surgeon: [name], DPM;  Location: [facility] ORS;  Service: Podiatry;  Laterality: Left;   INCISION AND DRAINAGE OF WOUND Left 11/09/2018   Procedure: Excision of left  foot wound with ACell placement;  Surgeon: Wallace Going, DO;  Location: [place];  Service: Plastics;  Laterality: Left;   IRRIGATION AND DEBRIDEMENT ABSCESS Left 09/07/2016   Procedure: IRRIGATION AND DEBRIDEMENT ABSCESS LEFT HEEL;  Surgeon: [name], DPM;  Location: [facility] ORS;  Service: Podiatry;  Laterality: Left;   JOINT REPLACEMENT  2013   left knee replacement   KIDNEY SURGERY Right    kidney stones   LITHOTRIPSY     LOWER EXTREMITY ANGIOGRAPHY Left 04/04/2018   Procedure: LOWER EXTREMITY ANGIOGRAPHY;  Surgeon: [name], MD;  Location: [place] CV LAB;  Service: Cardiovascular;  Laterality: Left;   NM GATED [facility]OV[facility])  February 2017   Likely breast attenuation. LOW RISK study. Normal EF 55-60%.   OTHER SURGICAL HISTORY     08/2016 or 09/2016 surgery on achilles tenso h/o staph infection heal. Dr. [name]   removal of left hematoma Left    leg   REVERSE SHOULDER ARTHROPLASTY Right 10/08/2015   Procedure: REVERSE SHOULDER ARTHROPLASTY;  Surgeon: [name], MD;  Location: [facility] ORS;  Service: Orthopedics;  Laterality: Right;   SLEEVE GASTROPLASTY     TONSILLECTOMY     TOTAL KNEE ARTHROPLASTY Left    TOTAL SHOULDER REPLACEMENT Right    TRANSESOPHAGEAL  ECHOCARDIOGRAM  08/08/2013   Mild LVH, EF 60-65%. Moderate LA dilation and mild RA dilation. Mild MR with no evidence of stenosis and no evidence of endocarditis. A false chordae is noted.   TRANSTHORACIC ECHOCARDIOGRAM  08/03/2013   Mild-Moderate concentric LVH, EF 60-65%. Normal diastolic function. Mild LA dilation. Mild MR with possible vegetation  - not confirmed on TEE    ULNAR NERVE TRANSPOSITION Right 08/06/2016   Procedure: ULNAR NERVE DECOMPRESSION/TRANSPOSITION;  Surgeon: [name], MD;  Location: [facility] ORS;  Service: Orthopedics;  Laterality: Right;   UPPER GI ENDOSCOPY     VAGINAL HYSTERECTOMY      Current Medications: Current Meds  Medication Sig   acetaminophen (TYLENOL) 325 MG tablet Take 2 tablets (650 mg total) by mouth every 6 (six) hours as needed for mild pain or moderate pain (or Fever >/= 101).   albuterol (PROVENTIL) (2.5 MG/3ML) 0.083% nebulizer solution Take 3 mLs (2.5 mg total) by nebulization every 4 (four) hours as needed for wheezing or shortness of breath.   ALPRAZolam (XANAX) 0.5 MG tablet TAKE ONE-HALF TABLET BY MOUTH TWICE DAILY AS NEEDED FOR ANXIETY   apixaban (ELIQUIS) 5 MG TABS tablet TAKE 1 TABLET BY MOUTH TWICE DAILY   benzonatate (TESSALON) 200 MG capsule Take 1 capsule (200 mg total) by mouth 3 (three) times daily as needed for cough.   buPROPion (WELLBUTRIN XL) 150 MG 24 hr tablet Take 1 tablet (150 mg total) by mouth daily.   calcium-vitamin D (OSCAL WITH D) 500-200 MG-UNIT tablet Take 1 tablet by mouth 2 (two) times daily with a meal.   cholecalciferol (VITAMIN D) 25 MCG tablet Take 1 tablet (1,000 Units total) by mouth 2 (two) times daily with a meal.   clobetasol cream (TEMOVATE) 3.29 % Apply 1 application topically 2 (two) times daily as needed (skin irritation).    colchicine 0.6 MG tablet Take 1 tablet (0.6 mg total) by mouth as needed. May repeat 1 pill in 1 hr. No more than 3 total pills 1.8 mg in 24 hours as needed gout flare   collagenase  (SANTYL) ointment Apply 1 application topically at bedtime. X7-10 days   diclofenac Sodium (VOLTAREN) 1 % GEL Apply 2  g topically 3 (three) times daily.   DULoxetine (CYMBALTA) 60 MG capsule Take 1 capsule (60 mg total) by mouth daily.   ergocalciferol (VITAMIN D2) 1.25 MG (50000 UT) capsule Take 50,000 Units by mouth once a week.   estradiol (ESTRACE) 0.1 MG/GM vaginal cream Place 1 Applicatorful vaginally every Monday, Wednesday, and Friday. Apply 0.5mg  (pea-sized amount)  just inside the vaginal introitus with a finger-tip on Monday, Wednesday and Friday nights,   fluconazole (DIFLUCAN) 150 MG tablet Take 1 tablet (150 mg total) by mouth daily.   fluticasone-salmeterol (ADVAIR HFA) 115-21 MCG/ACT inhaler Inhale 2 puffs into the lungs daily. Rinse mouth thoroughly after use   folic acid (FOLVITE) 1 MG tablet TAKE 1 TABLET BY MOUTH DAILY   furosemide (LASIX) 20 MG tablet TAKE ONE TABLET BY MOUTH EVERY DAY   [START ON 04/12/2021] HYDROcodone-acetaminophen (NORCO/VICODIN) 5-325 MG tablet Take 1 tablet by mouth 2 (two) times daily as needed for severe pain. Must last 30 days   levocetirizine (XYZAL) 5 MG tablet Take 1 tablet (5 mg total) by mouth at bedtime as needed for allergies.   levothyroxine (SYNTHROID) 150 MCG tablet Take 1 tablet (150 mcg total) by mouth daily before breakfast. D/c prior sig   losartan (COZAAR) 50 MG tablet Take 1 tablet (50 mg total) by mouth daily. D/c losartan 50-12.5   metFORMIN (GLUCOPHAGE) 500 MG tablet Take 1 tablet (500 mg total) by mouth daily with breakfast. With food   metoprolol succinate (TOPROL-XL) 25 MG 24 hr tablet TAKE 1/2 TABLET BY MOUTH EVERY DAY   Misc. Devices (TUB TRANSFER BOARD) MISC 1 Device by Does not apply route as needed.   montelukast (SINGULAIR) 10 MG tablet Take 1 tablet (10 mg total) by mouth at bedtime.   Multiple Vitamin (MULTIVITAMIN WITH MINERALS) TABS tablet Take 1 tablet by mouth daily.   mupirocin ointment (BACTROBAN) 2 % Apply 1  application topically 2 (two) times daily. Right lower leg   ondansetron (ZOFRAN) 4 MG tablet Take 1 tablet (4 mg total) by mouth every 8 (eight) hours as needed for nausea or vomiting.   pantoprazole (PROTONIX) 40 MG tablet Take 1 tablet (40 mg total) by mouth daily. 30 min before food   pregabalin (LYRICA) 50 MG capsule TAKE 1 CAPSULE (50 MG) BY MOUTH 3 TIMES DAILY   rosuvastatin (CRESTOR) 20 MG tablet TAKE 1 TABLET BY  MOUTH ONCE DAILY   sitaGLIPtin (JANUVIA) 50 MG tablet Take 1 tablet (50 mg total) by mouth daily.   sucralfate (CARAFATE) 1 GM/10ML suspension Take 1 g by mouth 3 (three) times daily.    thiamine 100 MG tablet Take 0.5 tablets (50 mg total) by mouth 2 (two) times daily with a meal.   Vibegron (GEMTESA) 75 MG TABS Take 75 mg by mouth daily.   vitamin B-12 1000 MCG tablet Take 1 tablet (1,000 mcg total) by mouth daily.     Allergies:   Morphine and related, Penicillins, Ambien [zolpidem], Aspirin, and Oxytetracycline   Social History   Socioeconomic History   Marital status: Married    Spouse name: Not on file   Number of children: Not on file   Years of education: Not on file   Highest education level: Not on file  Occupational History   Not on file  Tobacco Use   Smoking status: Never   Smokeless tobacco: Never  Vaping Use   Vaping Use: Never used  Substance and Sexual Activity   Alcohol use: No    Alcohol/week: 0.0  standard drinks   Drug use: Never   Sexual activity: Not Currently  Other Topics Concern   Not on file  Social History Narrative   She is currently married -- for 30+ years. Does not work. Does not smoke or take alcohol. She never smoked. She exercises at least 3 days a week since before her gastric surgery.   Marital status reviewed in history of present illness.   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 Strain: Low Risk    Difficulty  of Paying Living Expenses: Not very hard  Food Insecurity: No Food Insecurity   Worried About Charity fundraiser in the Last Year: Never true   Arboriculturist in the Last Year: Never true  Transportation Needs: Public librarian (Medical): Yes   Lack of Transportation (Non-Medical): Yes  Physical Activity: Not on file  Stress: Not on file  Social Connections: Not on file     Family History: The patient's family history includes Alcohol abuse in her father; Arthritis in her maternal grandfather, maternal grandmother, mother, paternal grandfather, and paternal grandmother; Breast cancer in her maternal aunt; Cancer in her father and maternal aunt; Cerebral aneurysm in her father; Diabetes in her father, maternal grandmother, and paternal grandmother; Heart disease in her maternal grandfather; Hypertension in her father, maternal grandfather, maternal grandmother, paternal grandfather, and paternal grandmother; Kidney disease in her mother; Mental illness in her sister; Peripheral vascular disease in her father; Skin cancer in her father; Varicose Veins in her mother. There is no history of Bladder Cancer or Kidney cancer.  ROS:   Please see the history of present illness.     All other systems reviewed and are negative.  EKGs/Labs/Other Studies Reviewed:    The following studies were reviewed today:  Echo 03/2020 1. Left ventricular ejection fraction, by estimation, is 60 to 65%. The  left ventricle has normal function. The left ventricle has no regional  wall motion abnormalities. Left ventricular diastolic function could not  be evaluated.   2. Right ventricular systolic function is normal. The right ventricular  size is not well visualized.   3. The mitral valve is degenerative. Mild mitral valve regurgitation.   4. Tricuspid valve regurgitation is mild to moderate.   5. The aortic valve is tricuspid. Aortic valve regurgitation is not  visualized.    6. Agitated saline contrast bubble study was negative, with no evidence  of any interatrial shunt.   Myoview lexiscan 03/2018 Narrative & Impression  There was no ST segment deviation noted during stress. No T wave inversion was noted during stress. The study is normal. This is a low risk study. The left ventricular ejection fraction is hyperdynamic (>65%). Sinus bradycardia at baseline (47 bpm)    EKG:  EKG is  ordered today.  The ekg ordered today demonstrates SB, first degree AV block, nonspecific ST changes  Recent Labs: 04/14/2020: Magnesium 2.1 03/25/2021: ALT 9; BUN 23; Creatinine, Ser 1.19; Hemoglobin 8.6; Platelets 239; Potassium 4.7; Sodium 137; TSH 0.142  Recent Lipid Panel    Component Value Date/Time   CHOL 65 04/07/2020 0500   CHOL 185 10/10/2014 0822   TRIG 51 04/07/2020 0500   HDL 46 04/07/2020 0500   HDL 94 10/10/2014 0822   CHOLHDL 1.4 04/07/2020 0500   VLDL 10 04/07/2020 0500  [place] 9 04/07/2020 0500   LDLCALC 73 10/10/2014 0822      Physical Exam:    VS:  BP (!) 170/72 (BP Location: Right Arm, Patient Position: Sitting, Cuff Size: Normal)    Pulse (!) 57    Ht 5\' 5"  (1.651 m)    Wt 269 lb (122 kg)    SpO2 96%    BMI 44.76 kg/m     Wt Readings from Last 3 Encounters:  04/11/21 269 lb (122 kg)  03/25/21 270 lb (122.5 kg)  03/20/21 240 lb (108.9 kg)     GEN:  Well nourished, well developed in no acute distress HEENT: Normal NECK: No JVD; No carotid bruits LYMPHATICS: No lymphadenopathy CARDIAC: bradycardia, RR, no murmurs, rubs, gallops RESPIRATORY:  diminished on the right lower lobe ABDOMEN: Soft, non-tender, non-distended MUSCULOSKELETAL:  mild upper leg edema; No deformity  SKIN: Warm and dry NEUROLOGIC:  Alert and oriented x 3 PSYCHIATRIC:  Normal affect   ASSESSMENT:    1. Chronic diastolic heart failure (HCC)   2. Persistent atrial fibrillation ([place])   3. Dyspnea on exertion   4. History of CVA (cerebrovascular accident)   5.  Essential hypertension    PLAN:    In order of problems listed above:  HFpEF Volume status overall improved on lasix, however patient is having trouble making it to the bathroom. She is wearing compression socks, elevating her legs, and eating low salt. We discussed HCTZ, but decided it was best to continue lasix for now. I will check a BNP. On exam I noted diminished breath sounds on the right lower lobe. CXR ordered end of December, I recommended she get this down.  She did have some extremity edema right above the compression socks.   PAF SB today. Continue current dose of BB, Heart rate 57bpm. Continue stroke ppx with Eliquis.   HTN Recheck BP much better at  140/60, continue current medications.   H/o strokes Continue anticoagulation with Eliquis.   Disposition: Follow up in 4-6 week(s) with MD/APP   Signed, Martie Fulgham Ninfa Meeker, PA-C  04/11/2021 10:13 AM    [place]

## 2021-04-12 LAB — BRAIN NATRIURETIC PEPTIDE: BNP: 225.6 pg/mL — ABNORMAL HIGH (ref 0.0–100.0)

## 2021-04-13 DIAGNOSIS — G4733 Obstructive sleep apnea (adult) (pediatric): Secondary | ICD-10-CM | POA: Diagnosis not present

## 2021-04-14 ENCOUNTER — Telehealth: Payer: Self-pay | Admitting: Emergency Medicine

## 2021-04-14 NOTE — Telephone Encounter (Signed)
-----   Message from [place], PA-C sent at 04/14/2021  9:22 AM EST ----- BNP mildly elevated, has some extra volume. I would recommend lasix 20mg  BID for 3 days then back down to 20mg  daily.

## 2021-04-14 NOTE — Telephone Encounter (Signed)
Patient called. No answer.   Detailed message left per DPR.

## 2021-04-14 NOTE — Telephone Encounter (Signed)
Returned patient's call. No answer. Left message.

## 2021-04-14 NOTE — Telephone Encounter (Signed)
Patient is returning your call, she states it is ok to give the information to her husband.

## 2021-04-15 ENCOUNTER — Telehealth: Payer: Self-pay | Admitting: Internal Medicine

## 2021-04-15 ENCOUNTER — Inpatient Hospital Stay: Payer: Medicare HMO

## 2021-04-15 ENCOUNTER — Inpatient Hospital Stay: Payer: Medicare HMO | Admitting: Oncology

## 2021-04-15 ENCOUNTER — Telehealth: Payer: Self-pay | Admitting: *Deleted

## 2021-04-15 NOTE — Telephone Encounter (Signed)
Lft pt vm to call ofc regarding to follow up on referral. thanks

## 2021-04-15 NOTE — Telephone Encounter (Signed)
Pt returning call

## 2021-04-15 NOTE — Telephone Encounter (Signed)
I called back to husband and the pt had been coughing all day and did not need to come to cancer center. He wanted to r/s all appts til next week. He did have appt already 2/3. I have snet message to Odin to r/s. The husband said to please call with appt . Tom.

## 2021-04-15 NOTE — Progress Notes (Signed)
Called and spoke with patient, advised of results/recommendations per Tammy Parrett NP.  She verbalized understanding.  Nothing further needed.

## 2021-04-17 ENCOUNTER — Inpatient Hospital Stay: Payer: Medicare HMO

## 2021-04-17 ENCOUNTER — Ambulatory Visit: Payer: Medicare HMO | Admitting: Physician Assistant

## 2021-04-17 DIAGNOSIS — I872 Venous insufficiency (chronic) (peripheral): Secondary | ICD-10-CM | POA: Diagnosis not present

## 2021-04-17 DIAGNOSIS — E1152 Type 2 diabetes mellitus with diabetic peripheral angiopathy with gangrene: Secondary | ICD-10-CM | POA: Diagnosis not present

## 2021-04-17 DIAGNOSIS — E1159 Type 2 diabetes mellitus with other circulatory complications: Secondary | ICD-10-CM | POA: Diagnosis not present

## 2021-04-17 DIAGNOSIS — M86572 Other chronic hematogenous osteomyelitis, left ankle and foot: Secondary | ICD-10-CM | POA: Diagnosis not present

## 2021-04-17 DIAGNOSIS — I152 Hypertension secondary to endocrine disorders: Secondary | ICD-10-CM | POA: Diagnosis not present

## 2021-04-17 DIAGNOSIS — L97522 Non-pressure chronic ulcer of other part of left foot with fat layer exposed: Secondary | ICD-10-CM | POA: Diagnosis not present

## 2021-04-17 DIAGNOSIS — J449 Chronic obstructive pulmonary disease, unspecified: Secondary | ICD-10-CM | POA: Diagnosis not present

## 2021-04-17 DIAGNOSIS — E11621 Type 2 diabetes mellitus with foot ulcer: Secondary | ICD-10-CM | POA: Diagnosis not present

## 2021-04-17 DIAGNOSIS — L97812 Non-pressure chronic ulcer of other part of right lower leg with fat layer exposed: Secondary | ICD-10-CM | POA: Diagnosis not present

## 2021-04-17 DIAGNOSIS — E1142 Type 2 diabetes mellitus with diabetic polyneuropathy: Secondary | ICD-10-CM | POA: Diagnosis not present

## 2021-04-21 ENCOUNTER — Inpatient Hospital Stay: Payer: Medicare HMO

## 2021-04-22 ENCOUNTER — Telehealth: Payer: Self-pay | Admitting: *Deleted

## 2021-04-22 NOTE — Telephone Encounter (Addendum)
Husabnd called and wanted to know when pt appt this week. Has so many appts could not remember when her IV iron is . I called the home phinenumber he left on voicemail and said that the appt is feb. 2 this Thursday at 2 pm. If any questions please call back to [phone].

## 2021-04-24 ENCOUNTER — Inpatient Hospital Stay: Payer: Medicare HMO | Attending: Oncology

## 2021-04-24 ENCOUNTER — Other Ambulatory Visit: Payer: Self-pay

## 2021-04-24 VITALS — BP 132/68 | HR 61 | Temp 96.7°F | Resp 22

## 2021-04-24 DIAGNOSIS — D649 Anemia, unspecified: Secondary | ICD-10-CM | POA: Insufficient documentation

## 2021-04-24 DIAGNOSIS — Z79899 Other long term (current) drug therapy: Secondary | ICD-10-CM | POA: Diagnosis not present

## 2021-04-24 DIAGNOSIS — D508 Other iron deficiency anemias: Secondary | ICD-10-CM

## 2021-04-24 MED ORDER — SODIUM CHLORIDE 0.9 % IV SOLN: 200.0000 mg | INTRAVENOUS | Status: DC

## 2021-04-24 MED ORDER — IRON SUCROSE 20 MG/ML IV SOLN
200.0000 mg | Freq: Once | INTRAVENOUS | Status: AC
  Administered 2021-04-24: 200 mg via INTRAVENOUS
  Filled 2021-04-24: qty 10

## 2021-04-24 MED ORDER — SODIUM CHLORIDE 0.9 % IV SOLN
Freq: Once | INTRAVENOUS | Status: AC
  Filled 2021-04-24: qty 250

## 2021-04-25 ENCOUNTER — Ambulatory Visit: Payer: Medicare HMO | Admitting: Physician Assistant

## 2021-04-28 ENCOUNTER — Other Ambulatory Visit: Payer: Self-pay

## 2021-04-28 ENCOUNTER — Inpatient Hospital Stay: Payer: Medicare HMO

## 2021-04-28 VITALS — BP 139/58 | HR 89 | Temp 98.9°F | Resp 20

## 2021-04-28 DIAGNOSIS — D649 Anemia, unspecified: Secondary | ICD-10-CM | POA: Diagnosis not present

## 2021-04-28 DIAGNOSIS — Z79899 Other long term (current) drug therapy: Secondary | ICD-10-CM | POA: Diagnosis not present

## 2021-04-28 DIAGNOSIS — D508 Other iron deficiency anemias: Secondary | ICD-10-CM

## 2021-04-28 MED ORDER — SODIUM CHLORIDE 0.9 % IV SOLN: 200.0000 mg | INTRAVENOUS | Status: DC

## 2021-04-28 MED ORDER — SODIUM CHLORIDE 0.9 % IV SOLN
Freq: Once | INTRAVENOUS | Status: AC
  Filled 2021-04-28: qty 250

## 2021-04-28 MED ORDER — IRON SUCROSE 20 MG/ML IV SOLN
200.0000 mg | Freq: Once | INTRAVENOUS | Status: AC
  Administered 2021-04-28: 200 mg via INTRAVENOUS
  Filled 2021-04-28: qty 10

## 2021-04-28 NOTE — Patient Instructions (Signed)

## 2021-04-28 NOTE — Progress Notes (Signed)
Pt tolerated venofer infusion well today with no problems or complaints. Pt left infusion suite stable in a wheelchair.

## 2021-04-29 ENCOUNTER — Ambulatory Visit: Payer: Medicare HMO | Admitting: Pulmonary Disease

## 2021-04-29 ENCOUNTER — Encounter: Payer: Self-pay | Admitting: Pulmonary Disease

## 2021-04-29 VITALS — BP 118/60 | HR 79 | Temp 97.3°F | Ht 65.0 in | Wt 261.2 lb

## 2021-04-29 DIAGNOSIS — J986 Disorders of diaphragm: Secondary | ICD-10-CM

## 2021-04-29 DIAGNOSIS — G4733 Obstructive sleep apnea (adult) (pediatric): Secondary | ICD-10-CM | POA: Diagnosis not present

## 2021-04-29 DIAGNOSIS — J4540 Moderate persistent asthma, uncomplicated: Secondary | ICD-10-CM

## 2021-04-29 DIAGNOSIS — J45909 Unspecified asthma, uncomplicated: Secondary | ICD-10-CM

## 2021-04-29 DIAGNOSIS — Z6841 Body Mass Index (BMI) 40.0 and over, adult: Secondary | ICD-10-CM

## 2021-04-29 MED ORDER — TRELEGY ELLIPTA 200-62.5-25 MCG/ACT IN AEPB: 1.0000 | INHALATION_SPRAY | Freq: Every day | RESPIRATORY_TRACT | 0 refills | Status: DC

## 2021-04-29 NOTE — Progress Notes (Signed)
Subjective:    Patient ID: [name], adult    DOB: 1950/09/13, 71 y.o.   MRN: [id number]  Patient Care Team: [name], [name], MD as PCP - General (Internal Medicine) [place] Hampshire, MD as PCP - Cardiology (Cardiology) [name], MD as Consulting Physician (Cardiology) Poggi, Marshall Cork, MD as Consulting Physician (Surgery) Sindy Guadeloupe, MD as Consulting Physician (Oncology)  Requesting MD/Service: Dr. Olivia Mackie [name] Date of initial consultation: 07/18/18, by Dr. Merton Border Reason for consultation: DOE, history of asthmatic bronchitis, chronic cough   PT PROFILE: 71y.o. female never smoker referred for eval of above problems. Previously seen for same by Dr. Ileene Musa and Dr. Raul Del.  Subsequently seen by Dr. Alva Garnet until Dr. Lanetta Inch departure from the practice.  DATA: 07/02 2020 50s: FEV1 1.42 L or 67% predicted FVC 1.77 L or 63%, FEV1/FVC 80%,no significant bronchodilator response, there was no air trapping.  ERV 12% DLCO 59%.  Consistent with RESTRICTIVE physiology likely on the basis of obesity.  No evidence of obstruction.  INTERVAL: Prior patient of Dr. Merton Border.  Last seen by Rexene Edison, NP 20 March 2021 for an acute office visit of "wheezing on and off" of 3 months duration.  Issues with asthmatic bronchitis, chronic cough, morbid obesity with alveolar hypoventilation and obstructive sleep apnea.  Chronic right hemidiaphragm elevation noted 2017).  Presents today for follow-up appointment.  Chief Complaint  Patient presents with   Follow-up   HPI [name] is a 71 year old lifelong never smoker with a prior diagnosis of asthmatic bronchitis who presents for follow-up as noted above.  For the last 3 to 4 months she has noted worsening of chronic congested cough, "wheezing" and sputum production which she describes as "brown to green".  She has had no fevers, chills or sweats.  Had a mild COVID infection in June did not require  hospitalization.  She uses a flutter valve which was given to her on a prior visit.  She is very sedentary.  Has chronic left-sided weakness from prior stroke.  Regularly uses a walker, today she presents on the transport chair.  She has chronic issues with dysphagia and reflux for which she is on PPI.  Intermittently chokes when eating or drinking.  At her prior visit she was noted to be at baseline with her asthma chest x-ray at that time as noted below, showed cardiomegaly and chronically elevated right hemidiaphragm.  Current medications are Advair twice daily and Singulair she also uses albuterol inhaler or nebulizer daily as well she has steadily gained weight BMI now 43+.  No chest pain.  He has issues with heart failure with preserved ejection fraction (diastolic dysfunction).  Does have difficulties keeping fluid controlled, notes that she has had more edema.  She is anticoagulated with Eliquis for paroxysmal atrial fibrillation.  Follows regularly with cardiology.  She has obstructive sleep apnea on CPAP (since age 71) recent download showed excellent compliance at 100% with a daily average use of 8 hours and AHI of 6.2/hour.  At her last visit she had a new CPAP machine ordered.  Does not endorse any other difficulty.  Review of Systems A 10 point review of systems was performed and it is as noted above otherwise negative.  Past Medical History:  Diagnosis Date   Abnormal antibody titer    Anxiety and depression    Arthritis    Asthma    Atypical chest pain    a. 03/2018 MV: No ischemia/infarct. EF >65%.  Carotid arterial disease ([place])    a. 10/1189 U/S: RICA <47, LICA nl.   Cystocele    Depression    Diabetes ([place])    DVT (deep venous thrombosis) ([place])    Righ calf   Dyspnea    11/08/2018 - "more now due to lack of exercise"   Edema    GERD (gastroesophageal reflux disease)    Heart murmur    a. 08/2013 Echo: EF 60-65%, Mild MR (poss veg seen on TTE, not seen on TEE), nl LV  size with mod conc LVH. Mild LAE; b. 08/2019 Echo: EF 60-65%, no rwma. Nl RV size/fxn. No significant valvular dzs.   History of IBS    History of kidney stones    History of methicillin resistant staphylococcus aureus (MRSA) 2008   History of multiple strokes    Hyperlipidemia    Hypertension    Hypothyroidism    Insomnia    Kidney stone    kidney stones with lithotripsy .  [facility]- "adrenal glands"   Neuropathy involving both lower extremities    Non-healing ulcer of left foot (HCC)    Obesity, Class II, BMI 35-39.9, with comorbidity    PAF (paroxysmal atrial fibrillation) ([place])    a. 07/2013 Event monitor: Freq PVCs and 3 short runs of Afib-->CHA2DS2VASc = 7-->Eliquis.   Peripheral vascular disease ([place])    a. 03/2018 LE Angio: Nl renal arteries. Nl aorta & iliacs. Nl LLE arterial flow; b. 06/2020 ABI's:   Sleep apnea    use C-PAP   Stroke ([place])    03/2020   Urinary incontinence    Venous stasis dermatitis of both lower extremities    Past Surgical History:  Procedure Laterality Date   ANKLE SURGERY Right    APPLICATION OF WOUND VAC Left 06/27/2015   Procedure: APPLICATION OF WOUND VAC ( POSSIBLE ) ;  Surgeon: [name], MD;  Location: [facility] ORS;  Service: Vascular;  Laterality: Left;   APPLICATION OF WOUND VAC Left 09/25/2016   Procedure: APPLICATION OF WOUND VAC;  Surgeon: [name], DPM;  Location: [facility];  Service: Podiatry;  Laterality: Left;   arm surgery     right     CHOLECYSTECTOMY     COLONOSCOPY     COLONOSCOPY WITH PROPOFOL N/A 01/11/2017   Procedure: COLONOSCOPY WITH PROPOFOL;  Surgeon: [name], MD;  Location: [facility] ENDOSCOPY;  Service: Endoscopy;  Laterality: N/A;   COLONOSCOPY WITH PROPOFOL N/A 02/22/2017   Procedure: COLONOSCOPY WITH PROPOFOL;  Surgeon: [name], MD;  Location: [facility] ENDOSCOPY;  Service: Endoscopy;  Laterality: N/A;   COLONOSCOPY WITH PROPOFOL N/A 05/31/2017   Procedure: COLONOSCOPY WITH PROPOFOL;  Surgeon:  [name], MD;  Location: [facility] ENDOSCOPY;  Service: Endoscopy;  Laterality: N/A;   ESOPHAGOGASTRODUODENOSCOPY (EGD) WITH PROPOFOL N/A 01/11/2017   Procedure: ESOPHAGOGASTRODUODENOSCOPY (EGD) WITH PROPOFOL;  Surgeon: [name], MD;  Location: [facility] ENDOSCOPY;  Service: Endoscopy;  Laterality: N/A;   ESOPHAGOGASTRODUODENOSCOPY (EGD) WITH PROPOFOL N/A 10/25/2018   Procedure: ESOPHAGOGASTRODUODENOSCOPY (EGD) WITH PROPOFOL;  Surgeon: [name], MD;  Location: [facility] ENDOSCOPY;  Service: Endoscopy;  Laterality: N/A;   ESOPHAGOGASTRODUODENOSCOPY (EGD) WITH PROPOFOL N/A 11/29/2019   Procedure: ESOPHAGOGASTRODUODENOSCOPY (EGD) WITH PROPOFOL;  Surgeon: Toledo, Benay Pike, MD;  Location: [facility] ENDOSCOPY;  Service: Gastroenterology;  Laterality: N/A;   HERNIA REPAIR     umbilical   HIATAL HERNIA REPAIR     I & D EXTREMITY Left 06/27/2015   Procedure: IRRIGATION AND DEBRIDEMENT EXTREMITY            (  CALF HEMATOMA ) POSSIBLE WOUND VAC;  Surgeon: [name], MD;  Location: [facility];  Service: Vascular;  Laterality: Left;   INCISION AND DRAINAGE OF WOUND Left 09/25/2016   Procedure: IRRIGATION AND DEBRIDEMENT - PARTIAL RESECTION OF ACHILLES TENDON WITH WOUND VAC APPLICATION;  Surgeon: [name], DPM;  Location: [facility];  Service: Podiatry;  Laterality: Left;   INCISION AND DRAINAGE OF WOUND Left 11/09/2018   Procedure: Excision of left foot wound with ACell placement;  Surgeon: Wallace Going, DO;  Location: [place];  Service: Plastics;  Laterality: Left;   IRRIGATION AND DEBRIDEMENT ABSCESS Left 09/07/2016   Procedure: IRRIGATION AND DEBRIDEMENT ABSCESS LEFT HEEL;  Surgeon: [name], DPM;  Location: [facility];  Service: Podiatry;  Laterality: Left;   JOINT REPLACEMENT  2013   left knee replacement   KIDNEY SURGERY Right    kidney stones   LITHOTRIPSY     LOWER EXTREMITY ANGIOGRAPHY Left 04/04/2018   Procedure: LOWER EXTREMITY ANGIOGRAPHY;  Surgeon: [name], MD;   Location: [facility];  Service: Cardiovascular;  Laterality: Left;   NM GATED MYOVIEW [facility] HX)  February 2017   Likely breast attenuation. LOW RISK study. Normal EF 55-60%.   OTHER SURGICAL HISTORY     08/2016 or 09/2016 surgery on achilles tenso h/o staph infection heal. Dr. [name]   removal of left hematoma Left    leg   REVERSE SHOULDER ARTHROPLASTY Right 10/08/2015   Procedure: REVERSE SHOULDER ARTHROPLASTY;  Surgeon: [name], MD;  Location: [facility];  Service: Orthopedics;  Laterality: Right;   SLEEVE GASTROPLASTY     TONSILLECTOMY     TOTAL KNEE ARTHROPLASTY Left    TOTAL SHOULDER REPLACEMENT Right    TRANSESOPHAGEAL ECHOCARDIOGRAM  08/08/2013   Mild LVH, EF 60-65%. Moderate LA dilation and mild RA dilation. Mild MR with no evidence of stenosis and no evidence of endocarditis. A false chordae is noted.   TRANSTHORACIC ECHOCARDIOGRAM  08/03/2013   Mild-Moderate concentric LVH, EF 60-65%. Normal diastolic function. Mild LA dilation. Mild MR with possible vegetation  - not confirmed on TEE    ULNAR NERVE TRANSPOSITION Right 08/06/2016   Procedure: ULNAR NERVE DECOMPRESSION/TRANSPOSITION;  Surgeon: [name], MD;  Location: [facility];  Service: Orthopedics;  Laterality: Right;   UPPER GI ENDOSCOPY     VAGINAL HYSTERECTOMY     Patient Active Problem List   Diagnosis Date Noted   Iron deficiency anemia 03/27/2021   Recurrent strokes ([place]) 02/26/2021   Chronic shoulder pain (Bilateral) 02/10/2021   Impaired range of motion of shoulder (Left) 02/10/2021   Acute pain of shoulder (Left) 02/10/2021   Hyperlipidemia LDL goal <70 01/28/2021   Allergic rhinitis 51/76/1607   Uncomplicated opioid dependence ([place]) 07/15/2020   Cognitive deficit, post-stroke 06/22/2020   Peripheral neurogenic pain 06/20/2020   Chronic peripheral neuropathic pain 06/20/2020   Chronic use of opiate for therapeutic purpose 06/20/2020   History of cerebrovascular accident (CVA) due to embolism  06/19/2020   History of stroke with current residual effects 06/05/2020   ANA positive 04/18/2020   Nonsustained ventricular tachycardia 04/09/2020   Persistent atrial fibrillation ([place]) 04/04/2020   Bedbound 03/29/2020   B12 deficiency 03/11/2020   Ulcer of left heel and midfoot ([place]) 03/11/2020   PAD (peripheral artery disease) ([place]) 03/11/2020   Rotator cuff disorder, left 03/01/2020   Depression, recurrent ([place]) 03/01/2020   Cerebrovascular accident (CVA) ([place]) 03/01/2020   Primary osteoarthritis of left shoulder 02/12/2020   Folate deficiency  02/08/2020   Vitamin D deficiency 02/08/2020   Malnutrition of moderate degree 02/06/2020   Stroke ([place]) 02/05/2020   Chronic shoulder pain (Left) 01/03/2020   Left hemiparesis ([place]) 12/28/2019   Hypertension associated with diabetes ([place]) 03/25/7251   Acute embolic stroke ([place]) 66/44/0347   Chronic bacteriuria 11/17/2019   Hyperkalemia 09/14/2019   Bacteremia due to Gram-positive bacteria 09/08/2019   Pressure injury of skin 09/04/2019   Elevated troponin 09/02/2019   COPD with acute exacerbation ([place]) 09/02/2019   Acute respiratory failure with hypoxia (HCC) 09/02/2019   Pharmacologic therapy 08/30/2019   Recurrent falls 07/10/2019   Left ankle pain 06/02/2019   Chronic pain of right ankle 02/14/2019   Arthritis 02/14/2019   Nausea and vomiting 02/14/2019   Meralgia paresthetica (Right) 12/05/2018   Chronic anticoagulation (Eliquis) 12/05/2018   Peripheral vascular disease ([place]) 10/18/2018   Chronic ulcer of right leg ([place]) 10/18/2018   Fibromyalgia syndrome 08/17/2018   Chronic musculoskeletal pain 08/17/2018   Tremor of right hand 08/03/2018   Overactive bladder 08/03/2018   Physical deconditioning 08/03/2018   Recurrent UTI 07/29/2018   Foot ulcer, left ([place]) 03/09/2018   Adrenal mass ([place]) 08/02/2017   Eczema 06/27/2017   Diabetic ulcer of left foot ([place]) 06/24/2017   Mass of both adrenal glands ([place]) 05/16/2017    Fatty liver 05/13/2017   Hip strain (Right) 04/30/2017   Ischial pain, right 04/30/2017   Hip joint pain (Left) 04/22/2017   Abnormality of gait and mobility 04/22/2017   Muscle strain of hip (Left) 04/09/2017   Anemia 11/18/2016   Asthma 11/18/2016   Carpal tunnel syndrome 11/18/2016   Pain in joint involving ankle and foot 11/18/2016   Obstructive sleep apnea of adult 11/18/2016   Spinal stenosis of thoracic region 11/18/2016   Abscess of tendon of left foot 09/21/2016   Cubital tunnel syndrome (Right) 08/07/2016   Chronic pain syndrome 02/25/2016   Long term current use of opiate analgesic 02/25/2016   Chronic shoulder pain (Right) 02/25/2016   Status post reverse total shoulder replacement 10/08/2015   Neuropathy of lateral femoral cutaneous nerve (Right) 09/23/2015   Obesity, Class III, BMI 40-49.9 (morbid obesity) ([place]) 09/23/2015   Chronic prescription benzodiazepine use 09/23/2015   Arthralgia 09/23/2015   Osteoarthritis involving multiple joints 09/23/2015   Other shoulder lesions (Right) 08/28/2015   Cervical radiculitis 08/06/2015   Fall 05/22/2015   Opiate use (15 MME/Day) 01/16/2015   Impingement syndrome of shoulder region 01/07/2015   Mixed incontinence 11/26/2014   Atrophic vaginitis 11/26/2014   Depression with anxiety 09/21/2014   Bladder cystocele 09/21/2014   Gastro-esophageal reflux disease without esophagitis 09/21/2014   At high risk for falls 09/21/2014   DM (diabetes mellitus) type II, controlled, with peripheral vascular disorder ([place]) 08/31/2014   Diabetic peripheral neuropathy associated with type 2 diabetes mellitus ([place]) 08/31/2014   Asthma, well controlled 08/31/2014   Hypothyroidism, unspecified 08/31/2014   Peripheral vascular disease due to secondary diabetes mellitus ([place]) 12/11/2013   OSA (obstructive sleep apnea) 10/19/2013   AF (paroxysmal atrial fibrillation) ([place]) 07/27/2013   Essential hypertension 07/27/2013   Chronic atrial  fibrillation 08/10/2012   Family History  Problem Relation Age of Onset   Skin cancer Father    Diabetes Father    Hypertension Father    Peripheral vascular disease Father    Cancer Father    Cerebral aneurysm Father    Alcohol abuse Father    Varicose Veins Mother    Kidney disease Mother  Arthritis Mother    Mental illness Sister    Cancer Maternal Aunt        breast   Breast cancer Maternal Aunt    Arthritis Maternal Grandmother    Hypertension Maternal Grandmother    Diabetes Maternal Grandmother    Arthritis Maternal Grandfather    Heart disease Maternal Grandfather    Hypertension Maternal Grandfather    Arthritis Paternal Grandmother    Hypertension Paternal Grandmother    Diabetes Paternal Grandmother    Arthritis Paternal Grandfather    Hypertension Paternal Grandfather    Bladder Cancer Neg Hx    Kidney cancer Neg Hx    Social History   Tobacco Use   Smoking status: Never   Smokeless tobacco: Never  Substance Use Topics   Alcohol use: No    Alcohol/week: 0.0 standard drinks   Allergies  Allergen Reactions   Morphine And Related Other (See Comments) and Nausea Only    Patient becomes very confused Other reaction(s): Other (See Comments), Other (See Comments) Patient becomes very confused Patient becomes very confused    Penicillins Hives, Shortness Of Breath, Swelling and Other (See Comments)    Facial swelling Has patient had a PCN reaction causing immediate rash, facial/tongue/throat swelling, SOB or lightheadedness with hypotension: Yes Has patient had a PCN reaction causing severe rash involving mucus membranes or skin necrosis: Yes Has patient had a PCN reaction that required hospitalization No Has patient had a PCN reaction occurring within the last 10 years: No If all of the above answers are "NO", then may proceed with Cephalosporin use.  Other reaction(s): Other (See Comments), Other (See Comments) Facial swelling Has patient had a PCN  reaction causing immediate rash, facial/tongue/throat swelling, SOB or lightheadedness with hypotension: Yes Has patient had a PCN reaction causing severe rash involving mucus membranes or skin necrosis: Yes Has patient had a PCN reaction that required hospitalization No Has patient had a PCN reaction occurring within the last 10 years: No If all of the above answers are "NO", then may proceed with Cephalosporin use. Facial swelling Has patient had a PCN reaction causing immediate rash, facial/tongue/throat swelling, SOB or lightheadedness with hypotension: Yes Has patient had a PCN reaction causing severe rash involving mucus membranes or skin necrosis: Yes Has patient had a PCN reaction that required hospitalization No Has patient had a PCN reaction occurring within the last 10 years: No If all of the above answers are "NO", then may proceed with Cephalosporin use. Facial swelling Has patient had a PCN reaction causing immediate rash, facial/tongue/throat swelling, SOB or lightheadedness with hypotension: Yes Has patient had a PCN reaction causing severe rash involving mucus membranes or skin necrosis: Yes Has patient had a PCN reaction that required hospitalization No Has patient had a PCN reaction occurring within the last 10 years: No If all of the above answers are "NO", then may proceed with Cephalosporin use.    Ambien [Zolpidem]     Hallucination    Aspirin Hives   Oxytetracycline Hives    Other reaction(s): Unknown    Current Meds  Medication Sig   acetaminophen (TYLENOL) 325 MG tablet Take 2 tablets (650 mg total) by mouth every 6 (six) hours as needed for mild pain or moderate pain (or Fever >/= 101).   albuterol (PROVENTIL) (2.5 MG/3ML) 0.083% nebulizer solution Take 3 mLs (2.5 mg total) by nebulization every 4 (four) hours as needed for wheezing or shortness of breath.   ALPRAZolam (XANAX) 0.5 MG tablet TAKE ONE-HALF  TABLET BY MOUTH TWICE DAILY AS NEEDED FOR ANXIETY    apixaban (ELIQUIS) 5 MG TABS tablet TAKE 1 TABLET BY MOUTH TWICE DAILY   benzonatate (TESSALON) 200 MG capsule Take 1 capsule (200 mg total) by mouth 3 (three) times daily as needed for cough.   buPROPion (WELLBUTRIN XL) 150 MG 24 hr tablet Take 1 tablet (150 mg total) by mouth daily.   calcium-vitamin D (OSCAL WITH D) 500-200 MG-UNIT tablet Take 1 tablet by mouth 2 (two) times daily with a meal.   cholecalciferol (VITAMIN D) 25 MCG tablet Take 1 tablet (1,000 Units total) by mouth 2 (two) times daily with a meal.   clobetasol cream (TEMOVATE) 4.13 % Apply 1 application topically 2 (two) times daily as needed (skin irritation).    colchicine 0.6 MG tablet Take 1 tablet (0.6 mg total) by mouth as needed. May repeat 1 pill in 1 hr. No more than 3 total pills 1.8 mg in 24 hours as needed gout flare   collagenase (SANTYL) ointment Apply 1 application topically at bedtime. X7-10 days   diclofenac Sodium (VOLTAREN) 1 % GEL Apply 2 g topically 3 (three) times daily.   DULoxetine (CYMBALTA) 60 MG capsule Take 1 capsule (60 mg total) by mouth daily.   ergocalciferol (VITAMIN D2) 1.25 MG (50000 UT) capsule Take 50,000 Units by mouth once a week.   estradiol (ESTRACE) 0.1 MG/GM vaginal cream Place 1 Applicatorful vaginally every Monday, Wednesday, and Friday. Apply 0.5mg  (pea-sized amount)  just inside the vaginal introitus with a finger-tip on Monday, Wednesday and Friday nights,   fluconazole (DIFLUCAN) 150 MG tablet Take by mouth daily.   fluconazole (DIFLUCAN) 150 MG tablet Take 1 tablet (150 mg total) by mouth daily.   fluticasone-salmeterol (ADVAIR HFA) 115-21 MCG/ACT inhaler Inhale 2 puffs into the lungs daily. Rinse mouth thoroughly after use   folic acid (FOLVITE) 1 MG tablet TAKE 1 TABLET BY MOUTH DAILY   furosemide (LASIX) 20 MG tablet TAKE ONE TABLET BY MOUTH EVERY DAY   HYDROcodone-acetaminophen (NORCO/VICODIN) 5-325 MG tablet Take 1 tablet by mouth 2 (two) times daily as needed for severe pain.  Must last 30 days   levocetirizine (XYZAL) 5 MG tablet Take 1 tablet (5 mg total) by mouth at bedtime as needed for allergies.   levothyroxine (SYNTHROID) 150 MCG tablet Take 1 tablet (150 mcg total) by mouth daily before breakfast. D/c prior sig   losartan (COZAAR) 50 MG tablet Take 1 tablet (50 mg total) by mouth daily. D/c losartan 50-12.5   metFORMIN (GLUCOPHAGE) 500 MG tablet Take 1 tablet (500 mg total) by mouth daily with breakfast. With food   metoprolol succinate (TOPROL-XL) 25 MG 24 hr tablet TAKE 1/2 TABLET BY MOUTH EVERY DAY   Misc. Devices (TUB TRANSFER BOARD) MISC 1 Device by Does not apply route as needed.   montelukast (SINGULAIR) 10 MG tablet Take 1 tablet (10 mg total) by mouth at bedtime.   Multiple Vitamin (MULTIVITAMIN WITH MINERALS) TABS tablet Take 1 tablet by mouth daily.   mupirocin ointment (BACTROBAN) 2 % Apply 1 application topically 2 (two) times daily. Right lower leg   ondansetron (ZOFRAN) 4 MG tablet Take 1 tablet (4 mg total) by mouth every 8 (eight) hours as needed for nausea or vomiting.   pantoprazole (PROTONIX) 40 MG tablet Take 1 tablet (40 mg total) by mouth daily. 30 min before food   predniSONE (DELTASONE) 20 MG tablet Take 1 tablet (20 mg total) by mouth daily with breakfast.  pregabalin (LYRICA) 50 MG capsule TAKE 1 CAPSULE (50 MG) BY MOUTH 3 TIMES DAILY   rosuvastatin (CRESTOR) 20 MG tablet TAKE 1 TABLET BY  MOUTH ONCE DAILY   sitaGLIPtin (JANUVIA) 50 MG tablet Take 1 tablet (50 mg total) by mouth daily.   sucralfate (CARAFATE) 1 GM/10ML suspension Take 1 g by mouth 3 (three) times daily.    thiamine 100 MG tablet Take 0.5 tablets (50 mg total) by mouth 2 (two) times daily with a meal.   Vibegron (GEMTESA) 75 MG TABS Take 75 mg by mouth daily.   vitamin B-12 1000 MCG tablet Take 1 tablet (1,000 mcg total) by mouth daily.   [DISCONTINUED] HYDROcodone-acetaminophen (NORCO/VICODIN) 5-325 MG tablet Take 1 tablet by mouth 2 (two) times daily as needed for  severe pain. Must last 30 days   Immunization History  Administered Date(s) Administered   Fluad Quad(high Dose 65+) 02/07/2020, 02/06/2021   Influenza, High Dose Seasonal PF 11/28/2014, 02/02/2018, 01/31/2019, 02/20/2019   Influenza, Seasonal, Injecte, Preservative Fre 01/17/2011   Influenza,inj,Quad PF,6+ Mos 12/18/2013   Influenza-Unspecified 12/18/2013, 12/31/2015, 11/27/2016, 02/02/2018   PFIZER(Purple Top)SARS-COV-2 Vaccination 06/29/2019, 07/29/2019, 01/22/2020   Pneumococcal Conjugate-13 11/27/2016   Pneumococcal Polysaccharide-23 01/17/2011, 12/17/2014   Tdap 03/09/2018       Objective:   Physical Exam BP 118/60 (BP Location: Left Arm, Patient Position: Sitting, Cuff Size: Normal)    Pulse 79    Temp (!) 97.3 F (36.3 C) (Oral)    Ht 5\' 5"  (1.651 m)    Wt 261 lb 3.2 oz (118.5 kg)    SpO2 92%    BMI 43.47 kg/m  GENERAL: Morbidly obese woman, presents in transport chair, normally uses walker.  No respiratory distress.  No conversational dyspnea. HEAD: Normocephalic, atraumatic.  EYES: Pupils equal, round, reactive to light.  No scleral icterus.  MOUTH: Nose/mouth/throat not examined due to masking requirements for COVID 19. NECK: Supple. No thyromegaly. Trachea midline. No JVD.  No adenopathy. PULMONARY: Good air entry bilaterally, decreased breath sounds on the right base (chronic).  Breath sounds coarse with no other adventitious sounds, no wheezing. CARDIOVASCULAR: S1 and S2. Regular rate and rhythm.  No rubs, murmurs or gallops heard. ABDOMEN: Protuberant, obese, otherwise benign. MUSCULOSKELETAL: No joint deformity, no clubbing, lower extremity edema 2+ has compression stockings.  NEUROLOGIC: Mild left sided weakness from prior CVA, no change, gait not tested patient on transport chair SKIN: Intact,warm,dry. PSYCH: Depressed mood  Chest x-ray performed 11 April 2021, independently reviewed, there is cardiomegaly, chronically elevated right hemidiaphragm low lung  volumes on the right, no pulmonary edema or focal infiltrates:      Assessment & Plan:     ICD-10-CM   1. Persistent asthma without complication, unspecified asthma severity  J45.909 Pulmonary Function Test [facility] Only   Still having issues with "congestion" Has not had recent PFTs, ordered new PFTs We will give her a trial of Trelegy Ellipta 200 Discontinue Advair     2. Chronically elevated hemidiaphragm - RIGHT  J98.6    This issue adds complexity to her management As she becomes more deconditioned this will add greatly to dyspnea Nocturnal use of CPAP should help with this    3. Morbid obesity with BMI of 40.0-44.9, adult ([place])  E66.01    Z68.41    Suspect concomitant alveolar hypoventilation This will also impair diaphragmatic function further Adds to her issues with dyspnea    4. OSA (obstructive sleep apnea)  G47.33    Good compliance with CPAP We will  need download on follow-up visit     Orders Placed This Encounter  Procedures   Pulmonary Function Test [facility] Only    Standing Status:   Future    Standing Expiration Date:   04/29/2022    Scheduling Instructions:     Next available.    Order Specific Question:   Full PFT: includes the following: basic spirometry, spirometry pre & post bronchodilator, diffusion capacity (DLCO), lung volumes    Answer:   Full PFT   Meds ordered this encounter  Medications   Fluticasone-Umeclidin-Vilant (TRELEGY ELLIPTA) 200-62.5-25 MCG/ACT AEPB    Sig: Inhale 1 puff into the lungs daily.    Dispense:  60 each    Refill:  0    Order Specific Question:   Lot Number?    Answer:   [id number]    Order Specific Question:   Expiration Date?    Answer:   08/22/2022    Order Specific Question:   Manufacturer?    Answer:   [employer] [12]    Order Specific Question:   Quantity    Answer:   2   States that she has been having more issues with her "asthma" we really need to establish new baseline for her PFTs.  Previously these only showed  restrictive physiology.  She has multiple issues that can make her "wheezing" an issue.  She has evidence of volume overload, obesity with likely obesity hypoventilation, severe deconditioning.  These issues will all worsen her underlying problems particularly her right hemidiaphragm elevation.  This will worsen her sensation of dyspnea.  She will need to be seen in 3 to 4 weeks time with either me or the nurse practitioner at that time.  She is to contact us prior to that time should any new difficulties arise.  We are giving her a trial of Trelegy Ellipta to see if this helps with her symptoms more than the Advair.  Renold Don, MD Advanced [facility]ch[facility]    *This note was dictated using voice recognition software/Dragon.  Despite best efforts to proofread, errors can occur which can change the meaning. Any transcriptional errors that result from this process are unintentional and may not be fully corrected at the time of dictation.

## 2021-04-29 NOTE — Patient Instructions (Signed)
Going to give you a trial of Trelegy Ellipta this is 1 puff daily.  Make sure you rinse your mouth well after you use it.  Do use a little bit of baking soda in the rinse water to prevent thrush.  Do not use Advair (purple inhaler) while on the Trelegy.  You may continue using your nebulizer medications as needed.  Continue using your "pickle" (Acapella).  We are getting breathing test this will give [place] an idea of what happening with your airways particularly in view of the paralyzed right diaphragm.  We will see you in follow-up in 3 to 4 weeks time with either me or the nurse practitioner at that time.

## 2021-04-29 NOTE — Progress Notes (Signed)
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

## 2021-04-30 ENCOUNTER — Ambulatory Visit: Payer: Medicare HMO | Admitting: Obstetrics and Gynecology

## 2021-05-01 ENCOUNTER — Inpatient Hospital Stay: Payer: Medicare HMO

## 2021-05-02 ENCOUNTER — Other Ambulatory Visit: Payer: Self-pay

## 2021-05-02 ENCOUNTER — Encounter: Payer: Medicare HMO | Attending: Physician Assistant | Admitting: Physician Assistant

## 2021-05-02 ENCOUNTER — Other Ambulatory Visit: Payer: Self-pay | Admitting: Nurse Practitioner

## 2021-05-02 DIAGNOSIS — E1142 Type 2 diabetes mellitus with diabetic polyneuropathy: Secondary | ICD-10-CM | POA: Diagnosis not present

## 2021-05-02 DIAGNOSIS — Z86718 Personal history of other venous thrombosis and embolism: Secondary | ICD-10-CM | POA: Insufficient documentation

## 2021-05-02 DIAGNOSIS — M86572 Other chronic hematogenous osteomyelitis, left ankle and foot: Secondary | ICD-10-CM | POA: Diagnosis not present

## 2021-05-02 DIAGNOSIS — I872 Venous insufficiency (chronic) (peripheral): Secondary | ICD-10-CM | POA: Insufficient documentation

## 2021-05-02 DIAGNOSIS — E669 Obesity, unspecified: Secondary | ICD-10-CM | POA: Insufficient documentation

## 2021-05-02 DIAGNOSIS — Z6839 Body mass index (BMI) 39.0-39.9, adult: Secondary | ICD-10-CM | POA: Diagnosis not present

## 2021-05-02 DIAGNOSIS — Z7901 Long term (current) use of anticoagulants: Secondary | ICD-10-CM | POA: Insufficient documentation

## 2021-05-02 DIAGNOSIS — L97822 Non-pressure chronic ulcer of other part of left lower leg with fat layer exposed: Secondary | ICD-10-CM | POA: Insufficient documentation

## 2021-05-02 DIAGNOSIS — I10 Essential (primary) hypertension: Secondary | ICD-10-CM | POA: Diagnosis not present

## 2021-05-02 DIAGNOSIS — L97812 Non-pressure chronic ulcer of other part of right lower leg with fat layer exposed: Secondary | ICD-10-CM | POA: Insufficient documentation

## 2021-05-02 DIAGNOSIS — E1159 Type 2 diabetes mellitus with other circulatory complications: Secondary | ICD-10-CM | POA: Diagnosis not present

## 2021-05-02 DIAGNOSIS — J449 Chronic obstructive pulmonary disease, unspecified: Secondary | ICD-10-CM | POA: Diagnosis not present

## 2021-05-02 DIAGNOSIS — E1152 Type 2 diabetes mellitus with diabetic peripheral angiopathy with gangrene: Secondary | ICD-10-CM | POA: Diagnosis not present

## 2021-05-02 DIAGNOSIS — L97522 Non-pressure chronic ulcer of other part of left foot with fat layer exposed: Secondary | ICD-10-CM | POA: Insufficient documentation

## 2021-05-02 DIAGNOSIS — E11621 Type 2 diabetes mellitus with foot ulcer: Secondary | ICD-10-CM | POA: Diagnosis not present

## 2021-05-02 DIAGNOSIS — I152 Hypertension secondary to endocrine disorders: Secondary | ICD-10-CM | POA: Diagnosis not present

## 2021-05-02 NOTE — Progress Notes (Signed)
KIMBERLLY, NORGARD ([id number]) Visit Report for 05/02/2021 Arrival Information Details Patient Name: [name], [name] Date of Service: 05/02/2021 2:00 PM Medical Record Number: [id number] Patient Account Number: [id number] Date of Birth/Sex: 1950/07/26 (71 y.o. F) Treating RN: Carlene Coria Primary Care Jomo Forand: [name], Olivia Mackie Other Clinician: Referring Zorian Gunderman: [name], Olivia Mackie Treating Jaylee Lantry/Extender: Skipper Cliche in Treatment: 41 Visit Information History Since Last Visit All ordered tests and consults were completed: No Patient Arrived: Wheel Chair Added or deleted any medications: No Arrival Time: 14:20 Any new allergies or adverse reactions: No Accompanied By: son Had a fall or experienced change in No Transfer Assistance: None activities of daily living that may affect Patient Identification Verified: Yes risk of falls: Secondary Verification Process Completed: Yes Signs or symptoms of abuse/neglect since last visito No Patient Requires Transmission-Based No Hospitalized since last visit: No Precautions: Implantable device outside of the clinic excluding No Patient Has Alerts: Yes cellular tissue based products placed in the center Patient Alerts: Patient on Blood Thinner since last visit: Eliquis Has Dressing in Place as Prescribed: Yes Diabetic II Has Compression in Place as Prescribed: Yes 06/2020 ABI L)1.22 R) 1.14 Pain Present Now: No Electronic Signature(s) Signed: 05/02/2021 3:45:36 PM By: Carlene Coria RN Entered By: Carlene Coria on 05/02/2021 14:27:19 Shough, [name] ([id number]) -------------------------------------------------------------------------------- Clinic Level of Care Assessment Details Patient Name: [name] Date of Service: 05/02/2021 2:00 PM Medical Record Number: [id number] Patient Account Number: [id number] Date of Birth/Sex: [birth year] (71 y.o. F) Treating RN: Carlene Coria Primary Care Yeudiel Mateo:  [name], Olivia Mackie Other Clinician: Referring Laronica Bhagat: [name], Olivia Mackie Treating Courtnee Myer/Extender: Skipper Cliche in Treatment: 64 Clinic Level of Care Assessment Items TOOL 4 Quantity Score X - Use when only an EandM is performed on FOLLOW-UP visit 1 0 ASSESSMENTS - Nursing Assessment / Reassessment X - Reassessment of Co-morbidities (includes updates in patient status) 1 10 X- 1 5 Reassessment of Adherence to Treatment Plan ASSESSMENTS - Wound and Skin Assessment / Reassessment X - Simple Wound Assessment / Reassessment - one wound 1 5 []  - 0 Complex Wound Assessment / Reassessment - multiple wounds []  - 0 Dermatologic / Skin Assessment (not related to wound area) ASSESSMENTS - Focused Assessment []  - Circumferential Edema Measurements - multi extremities 0 []  - 0 Nutritional Assessment / Counseling / Intervention []  - 0 Lower Extremity Assessment (monofilament, tuning fork, pulses) []  - 0 Peripheral Arterial Disease Assessment (using hand held doppler) ASSESSMENTS - Ostomy and/or Continence Assessment and Care []  - Incontinence Assessment and Management 0 []  - 0 Ostomy Care Assessment and Management (repouching, etc.) PROCESS - Coordination of Care X - Simple Patient / Family Education for ongoing care 1 15 []  - 0 Complex (extensive) Patient / Family Education for ongoing care []  - 0 Staff obtains Programmer, systems, Records, Test Results / Process Orders []  - 0 Staff telephones HHA, Nursing Homes / Clarify orders / etc []  - 0 Routine Transfer to another Facility (non-emergent condition) []  - 0 Routine Hospital Admission (non-emergent condition) []  - 0 New Admissions / Biomedical engineer / Ordering NPWT, Apligraf, etc. []  - 0 Emergency Hospital Admission (emergent condition) X- 1 10 Simple Discharge Coordination []  - 0 Complex (extensive) Discharge Coordination PROCESS - Special Needs []  - Pediatric / Minor Patient Management 0 []  - 0 Isolation  Patient Management []  - 0 Hearing / Language / Visual special needs []  - 0 Assessment of Community assistance (transportation, D/C planning, etc.) []  - 0 Additional assistance / Altered mentation []  - 0 Support Surface(s)  Assessment (bed, cushion, seat, etc.) INTERVENTIONS - Wound Cleansing / Measurement Crutcher, Elasha B. ([id number]) X- 1 5 Simple Wound Cleansing - one wound []  - 0 Complex Wound Cleansing - multiple wounds X- 1 5 Wound Imaging (photographs - any number of wounds) []  - 0 Wound Tracing (instead of photographs) X- 1 5 Simple Wound Measurement - one wound []  - 0 Complex Wound Measurement - multiple wounds INTERVENTIONS - Wound Dressings X - Small Wound Dressing one or multiple wounds 1 10 []  - 0 Medium Wound Dressing one or multiple wounds []  - 0 Large Wound Dressing one or multiple wounds []  - 0 Application of Medications - topical []  - 0 Application of Medications - injection INTERVENTIONS - Miscellaneous []  - External ear exam 0 []  - 0 Specimen Collection (cultures, biopsies, blood, body fluids, etc.) []  - 0 Specimen(s) / Culture(s) sent or taken to Lab for analysis []  - 0 Patient Transfer (multiple staff / Civil Service fast streamer / Similar devices) []  - 0 Simple Staple / Suture removal (25 or less) []  - 0 Complex Staple / Suture removal (26 or more) []  - 0 Hypo / Hyperglycemic Management (close monitor of Blood Glucose) []  - 0 Ankle / Brachial Index (ABI) - do not check if billed separately X- 1 5 Vital Signs Has the patient been seen at the hospital within the last three years: Yes Total Score: 75 Level Of Care: New/Established - Level 2 Electronic Signature(s) Signed: 05/02/2021 3:45:36 PM By: Carlene Coria RN Entered By: Carlene Coria on 05/02/2021 14:43:30 Vales, [name] ([id number]) -------------------------------------------------------------------------------- Encounter Discharge Information Details Patient Name: [name] Date of  Service: 05/02/2021 2:00 PM Medical Record Number: [id number] Patient Account Number: [id number] Date of Birth/Sex: February 10, 1951 (70 y.o. F) Treating RN: Carlene Coria Primary Care Yilin Weedon: [name], Olivia Mackie Other Clinician: Referring Vicktoria Muckey: [name], Olivia Mackie Treating Salah Burlison/Extender: Skipper Cliche in Treatment: 74 Encounter Discharge Information Items Discharge Condition: Stable Ambulatory Status: Wheelchair Discharge Destination: Home Transportation: Private Auto Accompanied By: husband Schedule Follow-up Appointment: Yes Clinical Summary of Care: Patient Declined Electronic Signature(s) Signed: 05/02/2021 3:45:36 PM By: Carlene Coria RN Entered By: Carlene Coria on 05/02/2021 14:46:16 Seckel, [name] ([id number]) -------------------------------------------------------------------------------- Lower Extremity Assessment Details Patient Name: [name] Date of Service: 05/02/2021 2:00 PM Medical Record Number: [id number] Patient Account Number: [id number] Date of Birth/Sex: 1950-06-28 (70 y.o. F) Treating RN: Carlene Coria Primary Care Sidni Fusco: [name], Olivia Mackie Other Clinician: Referring Jadzia Ibsen: [name], Olivia Mackie Treating Virginie Josten/Extender: Skipper Cliche in Treatment: 44 Edema Assessment Assessed: [Left: No] [Right: No] Edema: [Left: Ye] [Right: s] Vascular Assessment Pulses: Dorsalis Pedis Palpable: [Right:Yes] Electronic Signature(s) Signed: 05/02/2021 3:45:36 PM By: Carlene Coria RN Entered By: Carlene Coria on 05/02/2021 14:33:59 Sprinkle, [name] ([id number]) -------------------------------------------------------------------------------- Multi Wound Chart Details Patient Name: [name] Date of Service: 05/02/2021 2:00 PM Medical Record Number: [id number] Patient Account Number: [id number] Date of Birth/Sex: 1951/02/15 (70 y.o. F) Treating RN: Carlene Coria Primary Care Sharlotte Baka: [name], Olivia Mackie Other  Clinician: Referring Alferd Obryant: [name], Olivia Mackie Treating Kamden Reber/Extender: Skipper Cliche in Treatment: 13 Vital Signs Height(in): 65.5 Pulse(bpm): 62 Weight(lbs): 240 Blood Pressure(mmHg): 136/81 Body Mass Index(BMI): 39.3 Temperature(F): 98.4 Respiratory Rate(breaths/min): 18 Photos: [19:No Photos] [20:No Photos] Wound Location: Right, Anterior Lower Leg Right, Distal, Anterior Lower Leg Left, Proximal, Anterior Lower Leg Wounding Event: Blister Blister Blister Primary Etiology: 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 Extremity Extremity Comorbid History: Asthma, Pneumothorax, Asthma, Pneumothorax, Asthma, Pneumothorax, Arrhythmia, Hypertension, Type II Arrhythmia, Hypertension,  Type II Arrhythmia, Hypertension, Type II Diabetes, Gout, Osteoarthritis, Diabetes, Gout, Osteoarthritis, Diabetes, Gout, Osteoarthritis, Neuropathy Neuropathy Neuropathy Date Acquired: 04/01/2021 04/01/2021 03/30/2021 Weeks of Treatment: 4 4 4  Wound Status: Open Healed - Epithelialized Healed - Epithelialized Wound Recurrence: No No No Clustered Wound: No No Yes Measurements L x W x D (cm) 1x1.1x0.1 0x0x0 0x0x0 Area (cm) : 0.864 0 0 Volume (cm) : 0.086 0 0 % Reduction in Area: -340.80% 100.00% 100.00% % Reduction in Volume: 12.20% 100.00% 100.00% Classification: Grade 2 Grade 2 Grade 2 Exudate Amount: Medium Medium None Present Exudate Type: Serosanguineous Serosanguineous N/A Exudate Color: red, brown red, brown N/A Granulation Amount: Large (67-100%) Medium (34-66%) None Present (0%) Granulation Quality: Pink Pink N/A Necrotic Amount: Small (1-33%) Medium (34-66%) None Present (0%) Exposed Structures: Fat Layer (Subcutaneous Tissue): Fat Layer (Subcutaneous Tissue): Fascia: No Yes Yes Fat Layer (Subcutaneous Tissue): Fascia: No Fascia: No No Tendon: No Tendon: No Tendon: No Muscle: No Muscle: No Muscle:  No Joint: No Joint: No Joint: No Bone: No Bone: No Bone: No Epithelialization: None None None Treatment Notes Electronic Signature(s) Signed: 05/02/2021 3:45:36 PM By: Carlene Coria RN Entered By: Carlene Coria on 05/02/2021 14:37:20 SYNTHIA, FAIR[employer]ANK ([id number]) CLYTEE, HEINRICH ([id number]) -------------------------------------------------------------------------------- Multi-Disciplinary Care Plan Details Patient Name: [name] Date of Service: 05/02/2021 2:00 PM Medical Record Number: [id number] Patient Account Number: [id number] Date of Birth/Sex: April 08, 1950 (70 y.o. F) Treating RN: Carlene Coria Primary Care Morganne Haile: [name], Olivia Mackie Other Clinician: Referring Tzippy Testerman: [name], Olivia Mackie Treating Davette Nugent/Extender: Skipper Cliche in Treatment: 52 Active Inactive Electronic Signature(s) Signed: 05/02/2021 3:45:36 PM By: Carlene Coria RN Entered By: Carlene Coria on 05/02/2021 14:36:58 Shaver, [name] ([id number]) -------------------------------------------------------------------------------- Pain Assessment Details Patient Name: [name] Date of Service: 05/02/2021 2:00 PM Medical Record Number: [id number] Patient Account Number: [id number] Date of Birth/Sex: [birth year] (70 y.o. F) Treating RN: Carlene Coria Primary Care Lorri Fukuhara: [name], Olivia Mackie Other Clinician: Referring Sephora Boyar: [name], Olivia Mackie Treating Tatem Holsonback/Extender: Skipper Cliche in Treatment: 106 Active Problems Location of Pain Severity and Description of Pain Patient Has Paino No Site Locations Pain Management and Medication Current Pain Management: Electronic Signature(s) Signed: 05/02/2021 3:45:36 PM By: Carlene Coria RN Entered By: Carlene Coria on 05/02/2021 14:27:55 Chambers, [name] ([id number]) -------------------------------------------------------------------------------- Patient/Caregiver Education Details Patient Name: [name] Date of  Service: 05/02/2021 2:00 PM Medical Record Number: [id number] Patient Account Number: [id number] Date of Birth/Gender: 01-28-1951 (70 y.o. F) Treating RN: Carlene Coria Primary Care Physician: [name], Olivia Mackie Other Clinician: Referring Physician: [name], Olivia Mackie Treating Physician/Extender: Skipper Cliche in Treatment: 35 Education Assessment Education Provided To: Patient Education Topics Provided Wound/Skin Impairment: Methods: Explain/Verbal Responses: State content correctly Electronic Signature(s) Signed: 05/02/2021 3:45:36 PM By: Carlene Coria RN Entered By: Carlene Coria on 05/02/2021 14:45:34 Aydelott, [name] ([id number]) -------------------------------------------------------------------------------- Wound Assessment Details Patient Name: [name] Date of Service: 05/02/2021 2:00 PM Medical Record Number: [id number] Patient Account Number: [id number] Date of Birth/Sex: 05/01/1950 (70 y.o. F) Treating RN: Carlene Coria Primary Care Montae Stager: [name], Olivia Mackie Other Clinician: Referring Karina Nofsinger: [name], Olivia Mackie Treating Fabrizzio Marcella/Extender: Skipper Cliche in Treatment: 39 Wound Status Wound Number: 18 Primary Diabetic Wound/Ulcer of the Lower Extremity Etiology: Wound Location: Right, Anterior Lower Leg Wound Open Wounding Event: Blister Status: Date Acquired: 04/01/2021 Comorbid Asthma, Pneumothorax, Arrhythmia, Hypertension, Type Weeks Of Treatment: 4 History: II Diabetes, Gout, Osteoarthritis, Neuropathy Clustered Wound: No Photos Wound Measurements Length: (cm) 1 % Red Width: (cm) 1.1 % Red Depth: (cm) 0.1 Epith Area: (cm) 0.864 Tunn Volume: (cm)  0.086 Unde uction in Area: -340.8% uction in Volume: 12.2% elialization: None eling: No rmining: No Wound Description Classification: Grade 2 Foul Exudate Amount: Medium Sloug Exudate Type: Serosanguineous Exudate Color: red, brown Odor After Cleansing: No h/Fibrino  Yes Wound Bed Granulation Amount: Large (67-100%) Exposed Structure Granulation Quality: Pink Fascia Exposed: No Necrotic Amount: Small (1-33%) Fat Layer (Subcutaneous Tissue) Exposed: Yes Necrotic Quality: Adherent Slough Tendon Exposed: No Muscle Exposed: No Joint Exposed: No Bone Exposed: No Treatment Notes Wound #18 (Lower Leg) Wound Laterality: Right, Anterior Cleanser Peri-Wound Care Topical Primary Dressing MIYANA, MORDECAI B. ([id number]) Xeroform-HBD 2x2 (in/in) Discharge Instruction: Apply Xeroform-HBD 2x2 (in/in) as directed Secondary Dressing Coverlet Latex-Free Fabric Adhesive Dressings Discharge Instruction: 1.5 x 2 Secured With Compression Wrap Compression Stockings Add-Ons Electronic Signature(s) Signed: 05/02/2021 3:45:36 PM By: Carlene Coria RN Entered By: Carlene Coria on 05/02/2021 14:32:47 [place]brook, [name] ([id number]) -------------------------------------------------------------------------------- Wound Assessment Details Patient Name: [name] Date of Service: 05/02/2021 2:00 PM Medical Record Number: [id number] Patient Account Number: [id number] Date of Birth/Sex: 02-27-1951 (70 y.o. F) Treating RN: Carlene Coria Primary Care Sonam Wandel: [name], Olivia Mackie Other Clinician: Referring Jaray Boliver: [name], Olivia Mackie Treating Dayana Dalporto/Extender: Skipper Cliche in Treatment: 40 Wound Status Wound Number: 19 Primary Diabetic Wound/Ulcer of the Lower Extremity Etiology: Wound Location: Right, Distal, Anterior Lower Leg Wound Healed - Epithelialized Wounding Event: Blister Status: Date Acquired: 04/01/2021 Comorbid Asthma, Pneumothorax, Arrhythmia, Hypertension, Type Weeks Of Treatment: 4 History: II Diabetes, Gout, Osteoarthritis, Neuropathy Clustered Wound: No Wound Measurements Length: (cm) 0 Width: (cm) 0 Depth: (cm) 0 Area: (cm) 0 Volume: (cm) 0 % Reduction in Area: 100% % Reduction in Volume: 100% Epithelialization:  None Tunneling: No Undermining: No Wound Description Classification: Grade 2 Exudate Amount: Medium Exudate Type: Serosanguineous Exudate Color: red, brown Foul Odor After Cleansing: No Slough/Fibrino Yes Wound Bed Granulation Amount: Medium (34-66%) Exposed Structure Granulation Quality: Pink Fascia Exposed: No Necrotic Amount: Medium (34-66%) Fat Layer (Subcutaneous Tissue) Exposed: Yes Necrotic Quality: Adherent Slough Tendon Exposed: No Muscle Exposed: No Joint Exposed: No Bone Exposed: No Treatment Notes Wound #19 (Lower Leg) Wound Laterality: Right, Anterior, Distal Cleanser Peri-Wound Care Topical Primary Dressing Secondary Dressing Secured With Compression Wrap Compression Stockings Add-Ons Electronic Signature(s) Signed: 05/02/2021 3:45:36 PM By: Carlene Coria RN Entered By: Carlene Coria on 05/02/2021 14:33:25 Beiser, [name] ([id number]) -------------------------------------------------------------------------------- Wound Assessment Details Patient Name: [name] Date of Service: 05/02/2021 2:00 PM Medical Record Number: [id number] Patient Account Number: [id number] Date of Birth/Sex: 1950-03-31 (70 y.o. F) Treating RN: Carlene Coria Primary Care Evin Chirco: [name], Olivia Mackie Other Clinician: Referring Keziah Avis: [name], Olivia Mackie Treating Kemonie Cutillo/Extender: Skipper Cliche in Treatment: 5 Wound Status Wound Number: 20 Primary Diabetic Wound/Ulcer of the Lower Extremity Etiology: Wound Location: Left, Proximal, Anterior Lower Leg Wound Healed - Epithelialized Wounding Event: Blister Status: Date Acquired: 03/30/2021 Comorbid Asthma, Pneumothorax, Arrhythmia, Hypertension, Type Weeks Of Treatment: 4 History: II Diabetes, Gout, Osteoarthritis, Neuropathy Clustered Wound: Yes Wound Measurements Length: (cm) 0 Width: (cm) 0 Depth: (cm) 0 Area: (cm) 0 Volume: (cm) 0 % Reduction in Area: 100% % Reduction in Volume:  100% Epithelialization: None Tunneling: No Undermining: No Wound Description Classification: Grade 2 Exudate Amount: None Present Foul Odor After Cleansing: No Slough/Fibrino No Wound Bed Granulation Amount: None Present (0%) Exposed Structure Necrotic Amount: None Present (0%) Fascia Exposed: No Fat Layer (Subcutaneous Tissue) Exposed: No Tendon Exposed: No Muscle Exposed: No Joint Exposed: No Bone Exposed: No Treatment Notes Wound #20 (Lower Leg) Wound Laterality: Left, Anterior, Proximal Cleanser Peri-Wound Care Topical  Primary Dressing Secondary Dressing Secured With Compression Wrap Compression Stockings Add-Ons Electronic Signature(s) Signed: 05/02/2021 3:45:36 PM By: Carlene Coria RN Entered By: Carlene Coria on 05/02/2021 14:33:44 Vensel, [name] ([id number]) -------------------------------------------------------------------------------- Vitals Details Patient Name: [name] Date of Service: 05/02/2021 2:00 PM Medical Record Number: [id number] Patient Account Number: [id number] Date of Birth/Sex: 15-Dec-1950 (70 y.o. F) Treating RN: Carlene Coria Primary Care Hartleigh Edmonston: [name], Olivia Mackie Other Clinician: Referring Kalvin Buss: [name], Olivia Mackie Treating Bynum Mccullars/Extender: Skipper Cliche in Treatment: 47 Vital Signs Time Taken: 14:27 Temperature (F): 98.4 Height (in): 65.5 Pulse (bpm): 79 Weight (lbs): 240 Respiratory Rate (breaths/min): 18 Body Mass Index (BMI): 39.3 Blood Pressure (mmHg): 136/81 Reference Range: 80 - 120 mg / dl Electronic Signature(s) Signed: 05/02/2021 3:45:36 PM By: Carlene Coria RN Entered By: Carlene Coria on 05/02/2021 14:27:46

## 2021-05-02 NOTE — Progress Notes (Addendum)
[name], [name] ([id number]) Visit Report for 05/02/2021 Chief Complaint Document Details Patient Name: [name], [name] Date of Service: 05/02/2021 2:00 PM Medical Record Number: [id number] Patient Account Number: [id number] Date of Birth/Sex: 01-22-1951 (71 y.o. F) Treating RN: Carlene Coria Primary Care Provider: [name], [name] Other Clinician: Referring Provider: [name], [name] Treating Provider/Extender: Skipper Cliche in Treatment: 6 Information Obtained from: Patient Chief Complaint Bilateral Leg Ulcers and Right 4thToe Ulcer Electronic Signature(s) Signed: 05/02/2021 2:36:18 PM By: Worthy Keeler PA-C Entered By: Worthy Keeler on 05/02/2021 14:36:18 [name], [name] ([id number]) -------------------------------------------------------------------------------- HPI Details Patient Name: [name] Date of Service: 05/02/2021 2:00 PM Medical Record Number: [id number] Patient Account Number: [id number] Date of Birth/Sex: 1951-03-17 (70 y.o. F) Treating RN: Carlene Coria Primary Care Provider: [name], [name] Other Clinician: Referring Provider: [name], [name] Treating Provider/Extender: Skipper Cliche in Treatment: 61 History of Present Illness HPI Description: 06/28/20 upon evaluation today patient appears to be doing unfortunately somewhat poorly in regard to wounds that she has over her lower extremities. She has a wound on the left distal second toe, left posterior heel, and right anterior lower leg. With that being said all in all none of the wounds appear to be too bad the one that hurts her the most is actually the wound on the posterior heel which actually is also one of the smallest. Nonetheless I think there is some callus hiding what is really going on in this area. Fortunately there does not appear to be any obvious [name] of infection which is great news. Patient does have a history of diabetes mellitus type 2, chronic venous  insufficiency, hypertension, history of DVTs, and is on long-term anticoagulant therapy, Eliquis. 07/05/20 upon evaluation today patient appears to be doing well currently with regard to her wounds. I feel like she is making progress which is great news and overall there is Dawn signs of active infection at this time. Dawn fevers, chills, nausea, vomiting, or diarrhea. 4/29; patient has a only a small area remaining on the tip of her left second toe and a small area remaining on the left heel. The area on the right anterior lower leg is healed. They tell me that she had remote podiatric surgery on the toes of the left foot however they are very fixed in position and I wonder whether they are making it difficult to relieve pressure in her foot wear. She is a type II diabetic with neuropathy as well. 08/01/2020 upon evaluation today patient appears to be doing well currently in regard to her wounds. In fact everything appears to be doing much better. The posterior aspect of her heel is actually giving her some trouble not the Achilles area but a small wound that we did not even have marked. In fact I had noted this before. Nonetheless we are going to addressed that today. 5/27; most of the patient's wounds are healed including the left Achilles heel left toe. Right forearm is also healed. She has a new abrasion posteriorly in the right elbow area just above the olecranon this happened today with an abrasion from her car door 6/15; the patient's wounds on the left Achilles and left second toe remain healed. The area on the right forearm is also just about healed in fact the skin I replaced last time is looking viable which is good. She had me look at the left heel again which is not in the area of the wound more towards the tip of the heel at  the Achilles insertion site as well as the tip of the left foot second toe. In the Achilles area that is clearly is friction probably in bed at night. Not really near  where the wound was. The left second toe remains healed although this is hammered and overhanging first toe also puts pressure in this area 09/13/2020 upon evaluation today patient appears to be doing well with regard to all previous wounds that she had. Everything is completely healed. With that being said I do think that the patient unfortunately has a new skin tear on the right forearm which is due to her put a Band-Aid on it and then when it pulled off this caused some issues with skin tearing. Nonetheless I think that this needs to be addressed today. Hopefully will take too long to get this to heal. She is having some issues with pain in her heel but I think this is more related to be honest to her neuropathy as opposed to anything else. Readmission: 10/30/2020 upon evaluation today patient appears to be doing poorly in regard to her legs bilaterally. She is also having an issue with her fourth toe on the left. Fortunately there does not appear to be any signs of active infection at this time. Dawn fevers, chills, nausea, vomiting, or diarrhea. It is actually been quite a while since we have seen her. In fact June 24 was the last time that she was here in the clinic. She tells me she did not come back because things were just doing "pretty well". 11/29/2020 upon evaluation today patient actually appears to be doing well with regard to her arms those are completely healed. Her right leg is still open but doing okay. The fourth toe of the left foot does show some signs here of having some issues with necrotic tissue of an open wound. This is where a toenail was removed by podiatry. Subsequently I do think that we need to go ahead and see what we can do about getting this cleared away so being try to get some healing going forward. 12/13/2020 upon evaluation today patient appears to be doing decently well in regard to her wound on the right leg. In general I think that she is making good progress here.  Unfortunately she continues to have significant issues with swelling in the left leg is now even more swollen at this point. For that reason I do believe she would be benefited by wrapping both sides although she is not interested in doing this. Overall I think that she is definitely making some good progress here nonetheless in regard to the right leg left leg leave some to be desired. 12/27/2020 upon evaluation today patient appears to be doing well with regard to her wound in regard to the legs. Unfortunately the toe was not doing nearly as well. I am much more concerned about infection and osteomyelitis here. She had the MRI but right now were not seeing obvious [name] of redness spreading up to her foot this is good news I do not have the result of the MRI as of yet. I am good to place her on doxycycline however due to the fact that the wound does seem to be getting a little worse in regard to her toe. 01/14/2021 upon evaluation today patient appears to be doing well with regard to her ulcers. The right leg is doing well and even the toe seems to be doing well though she still has some [name] of bone exposed on  the toe which does not appear to be doing quite as good as I would like to see just and the fact that is still not covered over new tissue but looks tremendously better compared to 2 weeks ago. Overall very pleased in that regard. 01/28/2021 upon evaluation today patient's leg on the left actually appears to have new wounds this is due to her deciding to shave yesterday. Fortunately there does not appear to be any signs of infection currently. This is good news. Nonetheless I do believe that with these new areas open it would be best to wrap her leg to try to make sure we get this under control sooner rather than later. With regard to her toe there still does appear to be some [name] of bone exposed this was covered over with callus I did remove that today. Nonetheless I think this is  significantly smaller than previous. 02/11/2021 upon evaluation today patient appears to be doing okay in regard to her leg ulcers. I do not see anything obvious at this point this seems to be a complicating factor. I think that she is getting better. Were wrap of the right leg not the left leg. With that being said the left fourth Vaughan, Yuna B. ([id number]) toe ulcer still does have [name] of being open there is some drainage still on top of the wound I think we can to try to see about using something like a collagen dressing today to see if that would be of benefit for her. She is in agreement with giving this a trial. Nonetheless I am concerned about the fact that she continues to have issues here with this not healing it is confirmed chronic osteomyelitis she has been on antibiotics. I am going to extend those today as well. That is Keflex and that will be for 1 additional month. Nonetheless if she is not doing significantly better by the next time I see her I think that the biggest thing is going to be for [place] to go ahead and see what we can do about getting her started with hyperbaric oxygen therapy to try to save the toe. 02/20/2021 upon evaluation today patient presents for follow-up she actually came in a little bit early as the home health nurse was concerned about her toe becoming "gangrenous". With that being said the good news is there is [name] [name] of this and in fact the toe seems to be doing better at this point. What was over the toe was an area that had bled significantly and then subsequently dried making it look much worse than where things actually stand. There is just a very small opening remaining in the toe. In regard to her legs everything appears to be healed bilaterally. 02/24/2021 upon evaluation today patient appears to be doing well with regard to her toe ulcer. I am actually very pleased with where we stand and I think that she is making good progress at this  time. There is still a very small opening although I think the biggest issue is the collagen seems to be getting stuck and drying up because the wound is so small we probably just need to use something to keep this moist like a little piece of Xeroform gauze and see if we get this to close. 03/07/2021 upon evaluation patient actually appears to be making excellent progress in regard to her last wound on the toe. There is just a very tiny potential area that I can even be sure at first  until I get some of the dry skin off when I did get this removed there was just a very tiny pinpoint opening that was barely even moist. Actually feel like this is very close to complete resolution. She has been using the Xeroform that is doing great. 03/25/2021 upon evaluation today patient appears to be doing well with regard to her toe ulcer. In fact this appears to be potentially completely healed. Fortunately I do not see anything draining at this point which is great news. She does have a small blood blister that is already drying up and looking okay on the third toe as well I am not sure what happened here she has an either. Otherwise she is having some weeping from the right leg this again is newer she tells me from last week weighing in that her pulmonologist and then today weighing in at the hematologist that she gained 30 pounds. This is quite significant if indeed true and I think she is to contact her primary care provider ASAP. This would account for why her right leg is weeping as well. 04/04/2020 upon evaluation today patient appears to be doing excellent in regard to her toe which I actually think is healed today. With that being said she unfortunately is having issues with her leg swelling and again several blisters on each leg that have reopened unfortunately. 05/02/2021 upon evaluation today patient appears to be doing well with regard to her wounds. In fact is down just 1 on the anterior portion of  her right leg which is actually from a blister. Otherwise she is doing awesome. Electronic Signature(s) Signed: 05/02/2021 2:44:12 PM By: Worthy Keeler PA-C Entered By: Worthy Keeler on 05/02/2021 14:44:12 [name], [name] ([id number]) -------------------------------------------------------------------------------- Physical Exam Details Patient Name: [name] Date of Service: 05/02/2021 2:00 PM Medical Record Number: [id number] Patient Account Number: [id number] Date of Birth/Sex: 08-18-1950 (70 y.o. F) Treating RN: Carlene Coria Primary Care Provider: [name], [name] Other Clinician: Referring Provider: [name], [name] Treating Provider/Extender: Skipper Cliche in Treatment: 56 Constitutional Obese and well-hydrated in Dawn acute distress. Respiratory normal breathing without difficulty. Psychiatric this patient is able to make decisions and demonstrates good insight into disease process. Alert and Oriented x 3. pleasant and cooperative. Notes Upon inspection patient's wound bed actually showed signs of good granulation and epithelization at this point. Fortunately I do not see any [name] of active infection locally or systemically which is great news and overall I am very pleased with where we stand today. Electronic Signature(s) Signed: 05/02/2021 2:44:38 PM By: Worthy Keeler PA-C Entered By: Worthy Keeler on 05/02/2021 14:44:38 [name], [name] ([id number]) -------------------------------------------------------------------------------- Physician Orders Details Patient Name: [name] Date of Service: 05/02/2021 2:00 PM Medical Record Number: [id number] Patient Account Number: [id number] Date of Birth/Sex: 11-08-1950 (70 y.o. F) Treating RN: Carlene Coria Primary Care Provider: [name], [name] Other Clinician: Referring Provider: [name], [name] Treating Provider/Extender: Skipper Cliche in Treatment: 36 Verbal / Phone  Orders: Dawn Diagnosis Coding ICD-10 Coding Code Description E11.621 Type 2 diabetes mellitus with foot ulcer L97.522 Non-pressure chronic ulcer of other part of left foot with fat layer exposed I87.2 Venous insufficiency (chronic) (peripheral) L97.812 Non-pressure chronic ulcer of other part of right lower leg with fat layer exposed L97.822 Non-pressure chronic ulcer of other part of left lower leg with fat layer exposed Z79.01 Long term (current) use of anticoagulants Follow-up Appointments o Return Appointment in 1 week. o Nurse Visit as needed Home Health o  [place] for wound care. May utilize formulary equivalent dressing for wound treatment orders unless otherwise specified. Home Health Nurse may visit PRN to address patientos wound care needs. - home health to see on tuesday and we will see on friday o Scheduled days for dressing changes to be completed; exception, patient has scheduled wound care visit that day. o **Please direct any NON-WOUND related issues/requests for orders to patient's Primary Care Physician. **If current dressing causes regression in wound condition, may D/C ordered dressing product/s and apply Normal Saline Moist Dressing daily until next [place] or Other MD appointment. **Notify [facility] of regression in wound condition at [phone]. Bathing/ Shower/ Hygiene o May shower with wound dressing protected with water repellent cover or cast protector. o Dawn tub bath. o Other: - Be careful of new skin to protect-do not shave Anesthetic (Use 'Patient Medications' Section for Anesthetic Order Entry) o Lidocaine applied to wound bed Edema Control - Lymphedema / Segmental Compressive Device / Other o Patient to wear own compression stockings. Remove compression stockings every night before going to bed and put on every morning when getting up. o Elevate, Exercise Daily  and Avoid Standing for Long Periods of Time. o Elevate legs to the level of the heart and pump ankles as often as possible o Elevate leg(s) parallel to the floor when sitting. o DO YOUR BEST to sleep in the bed at night. DO NOT sleep in your recliner. Long hours of sitting in a recliner leads to swelling of the legs and/or potential wounds on your backside. Non-Wound Condition o Additional non-wound orders/instructions: - apply Betadine to left 2nd toe cover open areas right lower leg with silver alginate and cover with dressing as needed until stops leaking/ABD pad or kerlix CALL PULMONARY OR PCP FOR ORDERS OF GAINING WEIGHT Additional Orders / Instructions o Follow Nutritious Diet and Increase Protein Intake o Other: - Leave Vaseline gauze over steri strips to right forearm skin tear noted prior to visit; keep protected and allow to fall off and protect skin Wound Treatment Wound #18 - Lower Leg Wound Laterality: Right, Anterior Primary Dressing: Xeroform-HBD 2x2 (in/in) 2 x Per Week/30 Days Discharge Instructions: Apply Xeroform-HBD 2x2 (in/in) as directed [name], Dawn B. ([id number]) Secondary Dressing: Coverlet Latex-Free Fabric Adhesive Dressings 2 x Per Week/30 Days Discharge Instructions: 1.5 x 2 Electronic Signature(s) Signed: 05/02/2021 3:45:36 PM By: Carlene Coria RN Signed: 05/02/2021 4:49:47 PM By: Worthy Keeler PA-C Entered By: Carlene Coria on 05/02/2021 14:38:45 [name], [name] ([id number]) -------------------------------------------------------------------------------- Problem List Details Patient Name: [name] Date of Service: 05/02/2021 2:00 PM Medical Record Number: [id number] Patient Account Number: [id number] Date of Birth/Sex: 07/09/1950 (70 y.o. F) Treating RN: Carlene Coria Primary Care Provider: [name], [name] Other Clinician: Referring Provider: [name], [name] Treating Provider/Extender: Skipper Cliche in Treatment:  70 Active Problems ICD-10 Encounter Code Description Active Date MDM Diagnosis E11.621 Type 2 diabetes mellitus with foot ulcer 06/28/2020 Dawn Yes L97.522 Non-pressure chronic ulcer of other part of left foot with fat layer 06/28/2020 Dawn Yes exposed I87.2 Venous insufficiency (chronic) (peripheral) 06/28/2020 Dawn Yes L97.812 Non-pressure chronic ulcer of other part of right lower leg with fat layer 06/28/2020 Dawn Yes exposed L97.822 Non-pressure chronic ulcer of other part of left lower leg with fat layer 02/11/2021 Dawn Yes exposed Z79.01 Long term (current) use of anticoagulants 06/28/2020 Dawn Yes Inactive Problems ICD-10 Code Description Active Date Inactive Date I10 Essential (primary) hypertension 06/28/2020  06/28/2020 Z86.718 Personal history of other venous thrombosis and embolism 06/28/2020 06/28/2020 Resolved Problems ICD-10 Code Description Active Date Resolved Date M86.572 Other chronic hematogenous osteomyelitis, left ankle and foot 02/11/2021 02/11/2021 L97.322 Non-pressure chronic ulcer of left ankle with fat layer exposed 06/28/2020 06/28/2020 S50.811A Abrasion of right forearm, initial encounter 10/30/2020 10/30/2020 [name], [name] ([id number]) S50.311D Abrasion of right elbow, subsequent encounter 08/16/2020 08/16/2020 Electronic Signature(s) Signed: 05/02/2021 2:36:13 PM By: Worthy Keeler PA-C Entered By: Worthy Keeler on 05/02/2021 14:36:12 [name], [name] ([id number]) -------------------------------------------------------------------------------- Progress Note Details Patient Name: [name] Date of Service: 05/02/2021 2:00 PM Medical Record Number: [id number] Patient Account Number: [id number] Date of Birth/Sex: 1950/07/02 (70 y.o. F) Treating RN: Carlene Coria Primary Care Provider: [name], [name] Other Clinician: Referring Provider: [name], [name] Treating Provider/Extender: Skipper Cliche in Treatment: 86 Subjective Chief Complaint Information  obtained from Patient Bilateral Leg Ulcers and Right 4thToe Ulcer History of Present Illness (HPI) 06/28/20 upon evaluation today patient appears to be doing unfortunately somewhat poorly in regard to wounds that she has over her lower extremities. She has a wound on the left distal second toe, left posterior heel, and right anterior lower leg. With that being said all in all none of the wounds appear to be too bad the one that hurts her the most is actually the wound on the posterior heel which actually is also one of the smallest. Nonetheless I think there is some callus hiding what is really going on in this area. Fortunately there does not appear to be any obvious [name] of infection which is great news. Patient does have a history of diabetes mellitus type 2, chronic venous insufficiency, hypertension, history of DVTs, and is on long-term anticoagulant therapy, Eliquis. 07/05/20 upon evaluation today patient appears to be doing well currently with regard to her wounds. I feel like she is making progress which is great news and overall there is Dawn signs of active infection at this time. Dawn fevers, chills, nausea, vomiting, or diarrhea. 4/29; patient has a only a small area remaining on the tip of her left second toe and a small area remaining on the left heel. The area on the right anterior lower leg is healed. They tell me that she had remote podiatric surgery on the toes of the left foot however they are very fixed in position and I wonder whether they are making it difficult to relieve pressure in her foot wear. She is a type II diabetic with neuropathy as well. 08/01/2020 upon evaluation today patient appears to be doing well currently in regard to her wounds. In fact everything appears to be doing much better. The posterior aspect of her heel is actually giving her some trouble not the Achilles area but a small wound that we did not even have marked. In fact I had noted this before.  Nonetheless we are going to addressed that today. 5/27; most of the patient's wounds are healed including the left Achilles heel left toe. Right forearm is also healed. She has a new abrasion posteriorly in the right elbow area just above the olecranon this happened today with an abrasion from her car door 6/15; the patient's wounds on the left Achilles and left second toe remain healed. The area on the right forearm is also just about healed in fact the skin I replaced last time is looking viable which is good. She had me look at the left heel again which is not in the area of the wound  more towards the tip of the heel at the Achilles insertion site as well as the tip of the left foot second toe. In the Achilles area that is clearly is friction probably in bed at night. Not really near where the wound was. The left second toe remains healed although this is hammered and overhanging first toe also puts pressure in this area 09/13/2020 upon evaluation today patient appears to be doing well with regard to all previous wounds that she had. Everything is completely healed. With that being said I do think that the patient unfortunately has a new skin tear on the right forearm which is due to her put a Band-Aid on it and then when it pulled off this caused some issues with skin tearing. Nonetheless I think that this needs to be addressed today. Hopefully will take too long to get this to heal. She is having some issues with pain in her heel but I think this is more related to be honest to her neuropathy as opposed to anything else. Readmission: 10/30/2020 upon evaluation today patient appears to be doing poorly in regard to her legs bilaterally. She is also having an issue with her fourth toe on the left. Fortunately there does not appear to be any signs of active infection at this time. Dawn fevers, chills, nausea, vomiting, or diarrhea. It is actually been quite a while since we have seen her. In fact June  24 was the last time that she was here in the clinic. She tells me she did not come back because things were just doing "pretty well". 11/29/2020 upon evaluation today patient actually appears to be doing well with regard to her arms those are completely healed. Her right leg is still open but doing okay. The fourth toe of the left foot does show some signs here of having some issues with necrotic tissue of an open wound. This is where a toenail was removed by podiatry. Subsequently I do think that we need to go ahead and see what we can do about getting this cleared away so being try to get some healing going forward. 12/13/2020 upon evaluation today patient appears to be doing decently well in regard to her wound on the right leg. In general I think that she is making good progress here. Unfortunately she continues to have significant issues with swelling in the left leg is now even more swollen at this point. For that reason I do believe she would be benefited by wrapping both sides although she is not interested in doing this. Overall I think that she is definitely making some good progress here nonetheless in regard to the right leg left leg leave some to be desired. 12/27/2020 upon evaluation today patient appears to be doing well with regard to her wound in regard to the legs. Unfortunately the toe was not doing nearly as well. I am much more concerned about infection and osteomyelitis here. She had the MRI but right now were not seeing obvious [name] of redness spreading up to her foot this is good news I do not have the result of the MRI as of yet. I am good to place her on doxycycline however due to the fact that the wound does seem to be getting a little worse in regard to her toe. 01/14/2021 upon evaluation today patient appears to be doing well with regard to her ulcers. The right leg is doing well and even the toe seems to be doing well though she still  has some [name] of bone exposed on  the toe which does not appear to be doing quite as good as I would like to see just and the fact that is still not covered over new tissue but looks tremendously better compared to 2 weeks ago. Overall very pleased in that regard. 01/28/2021 upon evaluation today patient's leg on the left actually appears to have new wounds this is due to her deciding to shave yesterday. Fortunately there does not appear to be any signs of infection currently. This is good news. Nonetheless I do believe that with these new areas open it would be best to wrap her leg to try to make sure we get this under control sooner rather than later. With regard to her toe there still does [name], Dawn B. ([id number]) appear to be some [name] of bone exposed this was covered over with callus I did remove that today. Nonetheless I think this is significantly smaller than previous. 02/11/2021 upon evaluation today patient appears to be doing okay in regard to her leg ulcers. I do not see anything obvious at this point this seems to be a complicating factor. I think that she is getting better. Were wrap of the right leg not the left leg. With that being said the left fourth toe ulcer still does have [name] of being open there is some drainage still on top of the wound I think we can to try to see about using something like a collagen dressing today to see if that would be of benefit for her. She is in agreement with giving this a trial. Nonetheless I am concerned about the fact that she continues to have issues here with this not healing it is confirmed chronic osteomyelitis she has been on antibiotics. I am going to extend those today as well. That is Keflex and that will be for 1 additional month. Nonetheless if she is not doing significantly better by the next time I see her I think that the biggest thing is going to be for [place] to go ahead and see what we can do about getting her started with hyperbaric oxygen therapy to try to  save the toe. 02/20/2021 upon evaluation today patient presents for follow-up she actually came in a little bit early as the home health nurse was concerned about her toe becoming "gangrenous". With that being said the good news is there is [name] [name] of this and in fact the toe seems to be doing better at this point. What was over the toe was an area that had bled significantly and then subsequently dried making it look much worse than where things actually stand. There is just a very small opening remaining in the toe. In regard to her legs everything appears to be healed bilaterally. 02/24/2021 upon evaluation today patient appears to be doing well with regard to her toe ulcer. I am actually very pleased with where we stand and I think that she is making good progress at this time. There is still a very small opening although I think the biggest issue is the collagen seems to be getting stuck and drying up because the wound is so small we probably just need to use something to keep this moist like a little piece of Xeroform gauze and see if we get this to close. 03/07/2021 upon evaluation patient actually appears to be making excellent progress in regard to her last wound on the toe. There is just a very tiny potential area that  I can even be sure at first until I get some of the dry skin off when I did get this removed there was just a very tiny pinpoint opening that was barely even moist. Actually feel like this is very close to complete resolution. She has been using the Xeroform that is doing great. 03/25/2021 upon evaluation today patient appears to be doing well with regard to her toe ulcer. In fact this appears to be potentially completely healed. Fortunately I do not see anything draining at this point which is great news. She does have a small blood blister that is already drying up and looking okay on the third toe as well I am not sure what happened here she has an either. Otherwise  she is having some weeping from the right leg this again is newer she tells me from last week weighing in that her pulmonologist and then today weighing in at the hematologist that she gained 30 pounds. This is quite significant if indeed true and I think she is to contact her primary care provider ASAP. This would account for why her right leg is weeping as well. 04/04/2020 upon evaluation today patient appears to be doing excellent in regard to her toe which I actually think is healed today. With that being said she unfortunately is having issues with her leg swelling and again several blisters on each leg that have reopened unfortunately. 05/02/2021 upon evaluation today patient appears to be doing well with regard to her wounds. In fact is down just 1 on the anterior portion of her right leg which is actually from a blister. Otherwise she is doing awesome. Objective Constitutional Obese and well-hydrated in Dawn acute distress. Vitals Time Taken: 2:27 PM, Height: 65.5 in, Weight: 240 lbs, BMI: 39.3, Temperature: 98.4 F, Pulse: 79 bpm, Respiratory Rate: 18 breaths/min, Blood Pressure: 136/81 mmHg. Respiratory normal breathing without difficulty. Psychiatric this patient is able to make decisions and demonstrates good insight into disease process. Alert and Oriented x 3. pleasant and cooperative. General Notes: Upon inspection patient's wound bed actually showed signs of good granulation and epithelization at this point. Fortunately I do not see any [name] of active infection locally or systemically which is great news and overall I am very pleased with where we stand today. Integumentary (Hair, Skin) Wound #18 status is Open. Original cause of wound was Blister. The date acquired was: 04/01/2021. The wound has been in treatment 4 weeks. The wound is located on the Right,Anterior Lower Leg. The wound measures 1cm length x 1.1cm width x 0.1cm depth; 0.864cm^2 area and 0.086cm^3 volume. There is  Fat Layer (Subcutaneous Tissue) exposed. There is Dawn tunneling or undermining noted. There is a medium amount of serosanguineous drainage noted. There is large (67-100%) pink granulation within the wound bed. There is a small (1-33%) amount of necrotic tissue within the wound bed including Adherent Slough. Wound #19 status is Healed - Epithelialized. Original cause of wound was Blister. The date acquired was: 04/01/2021. The wound has been in treatment 4 weeks. The wound is located on the Right,Distal,Anterior Lower Leg. The wound measures 0cm length x 0cm width x 0cm depth; 0cm^2 area and 0cm^3 volume. There is Fat Layer (Subcutaneous Tissue) exposed. There is Dawn tunneling or undermining noted. There is a Yandell, Anastashia B. ([id number]) medium amount of serosanguineous drainage noted. There is medium (34-66%) pink granulation within the wound bed. There is a medium (34- 66%) amount of necrotic tissue within the wound bed including Adherent Slough. Wound #  20 status is Healed - Epithelialized. Original cause of wound was Blister. The date acquired was: 03/30/2021. The wound has been in treatment 4 weeks. The wound is located on the Left,Proximal,Anterior Lower Leg. The wound measures 0cm length x 0cm width x 0cm depth; 0cm^2 area and 0cm^3 volume. There is Dawn tunneling or undermining noted. There is a none present amount of drainage noted. There is Dawn granulation within the wound bed. There is Dawn necrotic tissue within the wound bed. Assessment Active Problems ICD-10 Type 2 diabetes mellitus with foot ulcer Non-pressure chronic ulcer of other part of left foot with fat layer exposed Venous insufficiency (chronic) (peripheral) Non-pressure chronic ulcer of other part of right lower leg with fat layer exposed Non-pressure chronic ulcer of other part of left lower leg with fat layer exposed Long term (current) use of anticoagulants Plan Follow-up Appointments: Return Appointment in 1 week. Nurse  Visit as needed Home Health: [place]: - [place] for wound care. May utilize formulary equivalent dressing for wound treatment orders unless otherwise specified. Home Health Nurse may visit [facility] to address patient s wound care needs. - home health to see on tuesday and we will see on friday Scheduled days for dressing changes to be completed; exception, patient has scheduled wound care visit that day. **Please direct any NON-WOUND related issues/requests for orders to patient's Primary Care Physician. **If current dressing causes regression in wound condition, may D/C ordered dressing product/s and apply Normal Saline Moist Dressing daily until next [place] or Other MD appointment. **Notify [facility] of regression in wound condition at [phone]. Bathing/ Shower/ Hygiene: May shower with wound dressing protected with water repellent cover or cast protector. Dawn tub bath. Other: - Be careful of new skin to protect-do not shave Anesthetic (Use 'Patient Medications' Section for Anesthetic Order Entry): Lidocaine applied to wound bed Edema Control - Lymphedema / Segmental Compressive Device / Other: Patient to wear own compression stockings. Remove compression stockings every night before going to bed and put on every morning when getting up. Elevate, Exercise Daily and Avoid Standing for Long Periods of Time. Elevate legs to the level of the heart and pump ankles as often as possible Elevate leg(s) parallel to the floor when sitting. DO YOUR BEST to sleep in the bed at night. DO NOT sleep in your recliner. Long hours of sitting in a recliner leads to swelling of the legs and/or potential wounds on your backside. Non-Wound Condition: Additional non-wound orders/instructions: - apply Betadine to left 2nd toe cover open areas right lower leg with silver alginate and cover with dressing as needed until stops leaking/ABD pad or kerlix CALL  PULMONARY OR PCP FOR ORDERS OF GAINING WEIGHT Additional Orders / Instructions: Follow Nutritious Diet and Increase Protein Intake Other: - Leave Vaseline gauze over steri strips to right forearm skin tear noted prior to visit; keep protected and allow to fall off and protect skin WOUND #18: - Lower Leg Wound Laterality: Right, Anterior Primary Dressing: Xeroform-HBD 2x2 (in/in) 2 x Per Week/30 Days Discharge Instructions: Apply Xeroform-HBD 2x2 (in/in) as directed Secondary Dressing: Coverlet Latex-Free Fabric Adhesive Dressings 2 x Per Week/30 Days Discharge Instructions: 1.5 x 2 1. Would recommend currently that we go ahead and continue with the wound care measures as before and the patient is in agreement with the plan. This includes the use of the Xeroform gauze to the wound on the right leg that is doing a great job. 2. Also can  recommend that we have the patient continue with the compression socks bilaterally she is doing a good job with these and I think she should continue to use those. 3. If she can find her lymphedema pumps I think she should start using those ASAP that can be absolutely beneficial as well. [name], [name] ([id number]) We will see patient back for reevaluation in 1 week here in the clinic. If anything worsens or changes patient will contact our office for additional recommendations. Electronic Signature(s) Signed: 05/02/2021 2:45:04 PM By: Worthy Keeler PA-C Entered By: Worthy Keeler on 05/02/2021 14:45:04 [name] ([id number]) -------------------------------------------------------------------------------- SuperBill Details Patient Name: [name] Date of Service: 05/02/2021 Medical Record Number: [id number] Patient Account Number: [id number] Date of Birth/Sex: 1951/02/07 (70 y.o. F) Treating RN: Carlene Coria Primary Care Provider: [name], [name] Other Clinician: Referring Provider: [name], [name] Treating Provider/Extender:  Skipper Cliche in Treatment: 44 Diagnosis Coding ICD-10 Codes Code Description E11.621 Type 2 diabetes mellitus with foot ulcer L97.522 Non-pressure chronic ulcer of other part of left foot with fat layer exposed I87.2 Venous insufficiency (chronic) (peripheral) L97.812 Non-pressure chronic ulcer of other part of right lower leg with fat layer exposed L97.822 Non-pressure chronic ulcer of other part of left lower leg with fat layer exposed Z79.01 Long term (current) use of anticoagulants Facility Procedures The patient participates with Medicare or their insurance follows the Medicare Facility Guidelines: CPT4 Code Description Modifier Quantity [id number] - WOUND CARE VISIT-LEV 2 EST PT 1 Physician Procedures CPT4 Code: 2440102 Description: 72536 - WC PHYS LEVEL 3 - EST PT Modifier: Quantity: 1 CPT4 Code: Description: ICD-10 Diagnosis Description E11.621 Type 2 diabetes mellitus with foot ulcer L97.522 Non-pressure chronic ulcer of other part of left foot with fat layer ex I87.2 Venous insufficiency (chronic) (peripheral) L97.812 Non-pressure chronic  ulcer of other part of right lower leg with fat la Modifier: posed yer exposed Quantity: Electronic Signature(s) Signed: 05/02/2021 2:45:59 PM By: Worthy Keeler PA-C Entered By: Worthy Keeler on 05/02/2021 14:45:59

## 2021-05-05 DIAGNOSIS — M19012 Primary osteoarthritis, left shoulder: Secondary | ICD-10-CM | POA: Diagnosis not present

## 2021-05-06 ENCOUNTER — Inpatient Hospital Stay: Payer: Medicare HMO

## 2021-05-06 NOTE — Progress Notes (Deleted)
No-show.  Cancel at the last minute.

## 2021-05-07 ENCOUNTER — Encounter: Payer: Medicare HMO | Admitting: Pain Medicine

## 2021-05-08 ENCOUNTER — Other Ambulatory Visit: Payer: Self-pay | Admitting: Obstetrics and Gynecology

## 2021-05-08 ENCOUNTER — Inpatient Hospital Stay: Payer: Medicare HMO

## 2021-05-08 ENCOUNTER — Other Ambulatory Visit: Payer: Self-pay

## 2021-05-08 ENCOUNTER — Ambulatory Visit: Payer: Medicare HMO | Admitting: Obstetrics and Gynecology

## 2021-05-08 ENCOUNTER — Encounter: Payer: Self-pay | Admitting: Obstetrics and Gynecology

## 2021-05-08 VITALS — BP 162/94 | HR 70

## 2021-05-08 VITALS — BP 121/64 | HR 51 | Temp 97.0°F | Resp 18

## 2021-05-08 DIAGNOSIS — D649 Anemia, unspecified: Secondary | ICD-10-CM | POA: Diagnosis not present

## 2021-05-08 DIAGNOSIS — Z79899 Other long term (current) drug therapy: Secondary | ICD-10-CM | POA: Diagnosis not present

## 2021-05-08 DIAGNOSIS — E1152 Type 2 diabetes mellitus with diabetic peripheral angiopathy with gangrene: Secondary | ICD-10-CM | POA: Diagnosis not present

## 2021-05-08 DIAGNOSIS — N3281 Overactive bladder: Secondary | ICD-10-CM

## 2021-05-08 DIAGNOSIS — L97812 Non-pressure chronic ulcer of other part of right lower leg with fat layer exposed: Secondary | ICD-10-CM | POA: Diagnosis not present

## 2021-05-08 DIAGNOSIS — R82998 Other abnormal findings in urine: Secondary | ICD-10-CM | POA: Diagnosis not present

## 2021-05-08 DIAGNOSIS — E11621 Type 2 diabetes mellitus with foot ulcer: Secondary | ICD-10-CM | POA: Diagnosis not present

## 2021-05-08 DIAGNOSIS — N393 Stress incontinence (female) (male): Secondary | ICD-10-CM

## 2021-05-08 DIAGNOSIS — L97522 Non-pressure chronic ulcer of other part of left foot with fat layer exposed: Secondary | ICD-10-CM | POA: Diagnosis not present

## 2021-05-08 DIAGNOSIS — I152 Hypertension secondary to endocrine disorders: Secondary | ICD-10-CM | POA: Diagnosis not present

## 2021-05-08 DIAGNOSIS — D508 Other iron deficiency anemias: Secondary | ICD-10-CM

## 2021-05-08 DIAGNOSIS — E1159 Type 2 diabetes mellitus with other circulatory complications: Secondary | ICD-10-CM | POA: Diagnosis not present

## 2021-05-08 DIAGNOSIS — E1142 Type 2 diabetes mellitus with diabetic polyneuropathy: Secondary | ICD-10-CM | POA: Diagnosis not present

## 2021-05-08 DIAGNOSIS — B3731 Acute candidiasis of vulva and vagina: Secondary | ICD-10-CM

## 2021-05-08 DIAGNOSIS — I872 Venous insufficiency (chronic) (peripheral): Secondary | ICD-10-CM | POA: Diagnosis not present

## 2021-05-08 DIAGNOSIS — J449 Chronic obstructive pulmonary disease, unspecified: Secondary | ICD-10-CM | POA: Diagnosis not present

## 2021-05-08 DIAGNOSIS — M86572 Other chronic hematogenous osteomyelitis, left ankle and foot: Secondary | ICD-10-CM | POA: Diagnosis not present

## 2021-05-08 DIAGNOSIS — R350 Frequency of micturition: Secondary | ICD-10-CM

## 2021-05-08 LAB — POCT URINALYSIS DIPSTICK
Appearance: ABNORMAL
Bilirubin, UA: NEGATIVE
Glucose, UA: NEGATIVE
Ketones, UA: NEGATIVE
Nitrite, UA: POSITIVE
Protein, UA: POSITIVE — AB
Spec Grav, UA: >=1.03 — AB (ref 1.010–1.025)
Urobilinogen, UA: 0.2 E.U./dL
pH, UA: 6 (ref 5.0–8.0)

## 2021-05-08 MED ORDER — SULFAMETHOXAZOLE-TRIMETHOPRIM 800-160 MG PO TABS: 1.0000 | ORAL_TABLET | Freq: Two times a day (BID) | ORAL | 0 refills | Status: AC

## 2021-05-08 MED ORDER — SODIUM CHLORIDE 0.9 % IV SOLN
Freq: Once | INTRAVENOUS | Status: AC
  Filled 2021-05-08: qty 250

## 2021-05-08 MED ORDER — FLUCONAZOLE 150 MG PO TABS: 150.0000 mg | ORAL_TABLET | Freq: Once | ORAL | 1 refills | Status: DC | PRN

## 2021-05-08 MED ORDER — IRON SUCROSE 20 MG/ML IV SOLN
200.0000 mg | Freq: Once | INTRAVENOUS | Status: AC
  Administered 2021-05-08: 200 mg via INTRAVENOUS
  Filled 2021-05-08: qty 10

## 2021-05-08 MED ORDER — SODIUM CHLORIDE 0.9 % IV SOLN: 200.0000 mg | INTRAVENOUS | Status: DC

## 2021-05-08 NOTE — Progress Notes (Signed)
[facility]   Subjective:     Chief Complaint:  Chief Complaint  Patient presents with   Follow-up   History of Present Illness: [name] is a 71 y.o. female with stress incontinence and OAB who presents for a pessary check. She was using a size #0 incontinence ring pessary. The pessary came out quickly once she got home and she does not want another one.   She has been taking Gemtesa for her OAB symptoms. She feels the medication has been working great. Still having leakage with cough and sneeze. Occasionally she is having bladder spasm, happens every other time she urinates. This usually happens when she has a bladder infection. She drinks one cup coffee in AM, otherwise will drink 7 up or gingerale, then water was well.   Normally takes her "fluid pill" in the morning. After taking it, has leakage.   Past Medical History: Patient  has a past medical history of Abnormal antibody titer, Anxiety and depression, Arthritis, Asthma, Atypical chest pain, Carotid arterial disease ([place]), Cystocele, Depression, Diabetes ([place]), DVT (deep venous thrombosis) ([place]), Dyspnea, Edema, GERD (gastroesophageal reflux disease), Heart murmur, History of IBS, History of kidney stones, History of methicillin resistant staphylococcus aureus (MRSA) (2008), History of multiple strokes, Hyperlipidemia, Hypertension, Hypothyroidism, Insomnia, Kidney stone, Neuropathy involving both lower extremities, Non-healing ulcer of left foot ([place]), Obesity, Class II, BMI 35-39.9, with comorbidity, PAF (paroxysmal atrial fibrillation) ([place]), Peripheral vascular disease ([place]), Sleep apnea, Stroke ([place]), Urinary incontinence, and Venous stasis dermatitis of both lower extremities.   Past Surgical History: She  has a past surgical history that includes Ankle surgery (Right); Vaginal hysterectomy; Sleeve Gastroplasty; Lithotripsy; Kidney surgery (Right); transthoracic echocardiogram (08/03/2013); transesophageal  echocardiogram (08/08/2013); Tonsillectomy; Total knee arthroplasty (Left); Hiatal hernia repair; Cholecystectomy; I & D extremity (Left, 06/27/2015); Application if wound vac (Left, 06/27/2015); removal of left hematoma (Left); NM GATED MYOVIEW [facility] HX) (February 2017); Reverse shoulder arthroplasty (Right, 10/08/2015); Hernia repair; Ulnar nerve transposition (Right, 08/06/2016); arm surgery; Irrigation and debridement abscess (Left, 09/07/2016); Incision and drainage of wound (Left, 09/25/2016); Application if wound vac (Left, 09/25/2016); Colonoscopy; Upper gi endoscopy; Esophagogastroduodenoscopy (egd) with propofol (N/A, 01/11/2017); Colonoscopy with propofol (N/A, 01/11/2017); Colonoscopy with propofol (N/A, 02/22/2017); OTHER SURGICAL HISTORY; Total shoulder replacement (Right); Joint replacement (2013); Colonoscopy with propofol (N/A, 05/31/2017); Lower Extremity Angiography (Left, 04/04/2018); Esophagogastroduodenoscopy (egd) with propofol (N/A, 10/25/2018); Incision and drainage of wound (Left, 11/09/2018); and Esophagogastroduodenoscopy (egd) with propofol (N/A, 11/29/2019).   Medications: She has a current medication list which includes the following prescription(s): acetaminophen, albuterol, alprazolam, eliquis, benzonatate, bupropion, calcium-vitamin d, vitamin d3, clobetasol cream, colchicine, collagenase, diclofenac sodium, duloxetine, ergocalciferol, estradiol, fluconazole, fluconazole, trelegy ellipta, folic acid, furosemide, hydrocodone-acetaminophen, levocetirizine, levothyroxine, losartan, metformin, metoprolol succinate, tub transfer board, montelukast, multivitamin with minerals, mupirocin ointment, ondansetron, pantoprazole, pregabalin, rosuvastatin, sitagliptin, sucralfate, thiamine, cyanocobalamin, and gemtesa.   Allergies: Patient is allergic to morphine and related, penicillins, ambien [zolpidem], aspirin, and oxytetracycline.   Social History: Patient  reports that she has never smoked. She has  never used smokeless tobacco. She reports that she does not drink alcohol and does not use drugs.      Objective:    Physical Exam: BP (!) 162/94    Pulse 70  Gen: No apparent distress, A&O x 3.    Laboratory Results: Urine dipstick shows: positive for nitrites, leukocytes, protein.    Assessment/Plan:    Assessment: [name] is a 71 y.o. with stress incontinence and OAB, now with acute UTI  Plan: -  Continue with Gemtesa 75mg  daily.  - We discussed reducing bladder irritants (coffee, carbonated beverages).  - Treat for urinary tract infection with bactrim DS BID x3 days. Urine sent for culture to confirm infection and sensitivities.  - We reviewed taking Gemtesa before taking blood pressure medication to see if that reduces urgency/ leakage. Then try timing voids to not leak.  - Reviewed options for stress urinary incontinence including urethral bulking and midurethral sling and she will consider these options.   She is working on walking for exercise.

## 2021-05-08 NOTE — Patient Instructions (Signed)

## 2021-05-08 NOTE — Progress Notes (Signed)
Requested diflucan for possible yeast infection with the antibiotics prescribed.

## 2021-05-08 NOTE — Patient Instructions (Addendum)
Try taking fluid pill in the afternoon, and then go the bathroom timed more often (avery hour or so).

## 2021-05-11 LAB — URINE CULTURE: Culture: >=100000 — AB

## 2021-05-11 NOTE — Progress Notes (Signed)
PROVIDER NOTE: Information contained herein reflects review and annotations entered in association with encounter. Interpretation of such information and data should be left to medically-trained personnel. Information provided to patient can be located elsewhere in the medical record under "Patient Instructions". Document created using STT-dictation technology, any transcriptional errors that may result from process are unintentional.    Patient: [name]  Service Category: E/M  Provider: Gaspar Cola, MD  DOB: 1950-06-06  DOS: 05/12/2021  Specialty: Interventional Pain Management  MRN: [id number]  Setting: Ambulatory outpatient  PCP: [name], [name], MD  Type: Established Patient    Referring Provider: Orland Mustard *  Location: Office  Delivery: Face-to-face     HPI  [name], a 71 y.o. year old adult, is here today because of her Chronic pain syndrome [G89.4]. Ms. Agredano primary complain today is Shoulder Pain (Left ) Last encounter: My last encounter with her was on 05/07/2021. Pertinent problems: Ms. Stillion has Diabetic peripheral neuropathy associated with type 2 diabetes mellitus ([place]); Other shoulder lesions (Right); Neuropathy of lateral femoral cutaneous nerve (Right); Arthralgia; Osteoarthritis involving multiple joints; Status post reverse total shoulder replacement; Chronic pain syndrome; Chronic shoulder pain (Right); Carpal tunnel syndrome; Cervical radiculitis; Cubital tunnel syndrome (Right); Impingement syndrome of shoulder region; Pain in joint involving ankle and foot; Spinal stenosis of thoracic region; Hip joint pain (Left); Abnormality of gait and mobility; Diabetic ulcer of left foot ([place]); Muscle strain of hip (Left); Hip strain (Right); Ischial pain, right; Fibromyalgia syndrome; Chronic musculoskeletal pain; Peripheral vascular disease ([place]); Chronic ulcer of right leg ([place]); Meralgia paresthetica (Right); Chronic pain of right ankle;  Arthritis; Left ankle pain; Chronic shoulder pain (Left); Left hemiparesis ([place]); Primary osteoarthritis of left shoulder; Rotator cuff disorder, left; Ulcer of left heel and midfoot (HCC); PAD (peripheral artery disease) ([place]); Peripheral neurogenic pain; Chronic peripheral neuropathic pain; Chronic shoulder pain (Bilateral); Impaired range of motion of shoulder (Left); and Acute pain of shoulder (Left) on their pertinent problem list. Pain Assessment: Severity of Chronic pain is reported as a 8 /10. Location: Shoulder Left/pain radiaties down to her elbow and at times to her finger. Onset: More than a month ago. Quality: Aching, Constant, Discomfort, Throbbing, Sharp, Shooting. Timing: Constant. Modifying factor(s): laying down and meds. Vitals:  height is _0  (1.676 m) and weight is 261 lb (118.4 kg). Her temperature is 98.4 F (36.9 C). Her blood pressure is 124/88 and her pulse is 57 (abnormal). Her oxygen saturation is 97%.   Reason for encounter: medication management.   The patient indicates doing well with the current medication regimen. No adverse reactions or side effects reported to the medications.  According to the patient she has been using more medication than usual secondary to her left shoulder pain.  She recently had an MRI of the left shoulder done on 01/31/2021 that indicates that she has severe osteoarthritis of the left glenohumeral joint with moderate tendinosis of the supraspinatus tendon with a small partial-thickness articular surface tear.  She has been getting injections into the shoulder by Dr. Thornton Park at [facility].  Today I reviewed the patient's MRI and she indicates that they have recommended that [facility] for an [facility]opedic evaluation to see if there is anything that they can do for her.  Apparently they consider her to be very high surgical risk.  She has had a right shoulder replacement and apparently the left side has been giving her quite a bit of problems.   She  has decreased range of motion with significant pain upon attempting to raise her arm above the head.  Today I have recommended that she follow-up with her recommendation to see a [facility] orthopedic surgeon and if by any chance everything else fails, we could try a diagnostic suprascapular nerve block and if this is effective in controlling some of her pain, we could move onto a radiofrequency ablation.  Today I make sure that she understood that although mid this may help with the pain it would not help with the function of the shoulder.  In fact, I have reminded her that by doing a radiofrequency ablation of the suprascapular nerve she may have some weakness on shoulder flexion and trying to bring her arm above the head, which would be secondary to the nerve as opposed to the rotator cuff.  She understood and accepted.  RTCB: 08/10/2021 Nonopioids transferred 02/19/2020: Lyrica and Cymbalta  Pharmacotherapy Assessment  Analgesic: Hydrocodone/APAP 5/325 mg, 1 tab PO q 8 hours (15 mg/day of hydrocodone) MME/day: 15 mg/day.   Monitoring: [place] PMP: PDMP reviewed during this encounter.       Pharmacotherapy: No side-effects or adverse reactions reported. Compliance: No problems identified. Effectiveness: Clinically acceptable.  Chauncey Fischer, RN  05/12/2021 10:28 AM  Sign when Signing Visit Nursing Pain Medication Assessment:  Safety precautions to be maintained throughout the outpatient stay will include: orient to surroundings, keep bed in low position, maintain call bell within reach at all times, provide assistance with transfer out of bed and ambulation.  Medication Inspection Compliance: Pill count conducted under aseptic conditions, in front of the patient. Neither the pills nor the bottle was removed from the patient's sight at any time. Once count was completed pills were immediately returned to the patient in their original bottle.  Medication: Hydrocodone/APAP Pill/Patch Count:  8 of 45  pills remain Pill/Patch Appearance: Markings consistent with prescribed medication Bottle Appearance: Standard pharmacy container. Clearly labeled. Filled Date: 1 / 25 / 2023 Last Medication intake:  YesterdaySafety precautions to be maintained throughout the outpatient stay will include: orient to surroundings, keep bed in low position, maintain call bell within reach at all times, provide assistance with transfer out of bed and ambulation.     UDS:  Summary  Date Value Ref Range Status  11/13/2020 Note  Final    Comment:    ==================================================================== [employer] Select 13 (MW) ==================================================================== Test                             Result       Flag       Units  Drug Present and Declared for Prescription Verification   Alprazolam                     32           EXPECTED   ng/mg creat   Alpha-hydroxyalprazolam        58           EXPECTED   ng/mg creat    Source of alprazolam is a scheduled prescription medication. Alpha-    hydroxyalprazolam is an expected metabolite of alprazolam.    Hydrocodone                    268          EXPECTED   ng/mg creat   Dihydrocodeine  93           EXPECTED   ng/mg creat   Norhydrocodone                 401          EXPECTED   ng/mg creat    Sources of hydrocodone include scheduled prescription medications.    Dihydrocodeine and norhydrocodone are expected metabolites of    hydrocodone. Dihydrocodeine is also available as a scheduled    prescription medication.  ==================================================================== Test                      Result    Flag   Units      Ref Range   Creatinine              88               mg/dL      >=20 ==================================================================== Declared Medications:  The flagging and interpretation on this report are based on the  following declared medications.  Unexpected  results may arise from  inaccuracies in the declared medications.   **Note: The testing scope of this panel includes these medications:   Alprazolam (Xanax)  Hydrocodone (Norco)   **Note: The testing scope of this panel does not include the  following reported medications:   Acetaminophen (Tylenol)  Acetaminophen (Norco)  Albuterol (Proventil HFA)  Apixaban (Eliquis)  Bupropion (Wellbutrin XL)  Calcium  Cholecalciferol  Clobetasol  Colchicine  Cyanocobalamin  Diclofenac (Voltaren)  Doxycycline  Duloxetine (Cymbalta)  Estradiol (Estrace)  Fesoterodine (Toviaz)  Fluticasone (Advair)  Folic Acid  Levocetirizine (Xyzal)  Levothyroxine (Synthroid)  Metformin (Glucophage)  Metoprolol (Toprol)  Montelukast (Singulair)  Multivitamin  Mupirocin (Bactroban)  Nitrofurantoin (Macrobid)  Ondansetron (Zofran)  Oxybutynin (Ditropan)  Pantoprazole (Protonix)  Pregabalin (Lyrica)  Rosuvastatin (Crestor)  Salmeterol (Advair)  Sitagliptin (Januvia)  Sucralfate (Carafate)  Sulfamethoxazole (Bactrim)  Thiamine  Topical  Trimethoprim (Bactrim)  Vitamin D  Vitamin D2 ==================================================================== For clinical consultation, please call [phone]. ====================================================================      ROS  Constitutional: Denies any fever or chills Gastrointestinal: No reported hemesis, hematochezia, vomiting, or acute GI distress Musculoskeletal: Denies any acute onset joint swelling, redness, loss of ROM, or weakness Neurological: No reported episodes of acute onset apraxia, aphasia, dysarthria, agnosia, amnesia, paralysis, loss of coordination, or loss of consciousness  Medication Review  ALPRAZolam, DULoxetine, Fluticasone-Umeclidin-Vilant, HYDROcodone-acetaminophen, Tub Transfer Board, Vibegron, Vitamin D3, acetaminophen, albuterol, apixaban, benzonatate, buPROPion, calcium-vitamin D, clobetasol cream,  colchicine, collagenase, cyanocobalamin, diclofenac Sodium, ergocalciferol, estradiol, fluconazole, folic acid, furosemide, levocetirizine, levothyroxine, losartan, metFORMIN, metoprolol succinate, montelukast, multivitamin with minerals, mupirocin ointment, nitrofurantoin (macrocrystal-monohydrate), ondansetron, pantoprazole, pregabalin, rosuvastatin, sitaGLIPtin, sucralfate, and thiamine  History Review  Allergy: Ms. Sunderland is allergic to morphine and related, penicillins, ambien [zolpidem], aspirin, and oxytetracycline. Drug: Ms. Filippone  reports no history of drug use. Alcohol:  reports no history of alcohol use. Tobacco:  reports that she has never smoked. She has never used smokeless tobacco. Social: Ms. Corbridge  reports that she has never smoked. She has never used smokeless tobacco. She reports that she does not drink alcohol and does not use drugs. Medical:  has a past medical history of Abnormal antibody titer, Anxiety and depression, Arthritis, Asthma, Atypical chest pain, Carotid arterial disease ([place]), Cystocele, Depression, Diabetes ([place]), DVT (deep venous thrombosis) ([place]), Dyspnea, Edema, GERD (gastroesophageal reflux disease), Heart murmur, History of IBS, History of kidney stones, History of methicillin resistant staphylococcus aureus (MRSA) (2008), History  of multiple strokes, Hyperlipidemia, Hypertension, Hypothyroidism, Insomnia, Kidney stone, Neuropathy involving both lower extremities, Non-healing ulcer of left foot ([place]), Obesity, Class II, BMI 35-39.9, with comorbidity, PAF (paroxysmal atrial fibrillation) ([place]), Peripheral vascular disease ([place]), Sleep apnea, Stroke ([place]), Urinary incontinence, and Venous stasis dermatitis of both lower extremities. Surgical: Ms. Dell  has a past surgical history that includes Ankle surgery (Right); Vaginal hysterectomy; Sleeve Gastroplasty; Lithotripsy; Kidney surgery (Right); transthoracic echocardiogram (08/03/2013); transesophageal  echocardiogram (08/08/2013); Tonsillectomy; Total knee arthroplasty (Left); Hiatal hernia repair; Cholecystectomy; I & D extremity (Left, 06/27/2015); Application if wound vac (Left, 06/27/2015); removal of left hematoma (Left); NM GATED MYOVIEW [facility] HX) (February 2017); Reverse shoulder arthroplasty (Right, 10/08/2015); Hernia repair; Ulnar nerve transposition (Right, 08/06/2016); arm surgery; Irrigation and debridement abscess (Left, 09/07/2016); Incision and drainage of wound (Left, 09/25/2016); Application if wound vac (Left, 09/25/2016); Colonoscopy; Upper gi endoscopy; Esophagogastroduodenoscopy (egd) with propofol (N/A, 01/11/2017); Colonoscopy with propofol (N/A, 01/11/2017); Colonoscopy with propofol (N/A, 02/22/2017); OTHER SURGICAL HISTORY; Total shoulder replacement (Right); Joint replacement (2013); Colonoscopy with propofol (N/A, 05/31/2017); Lower Extremity Angiography (Left, 04/04/2018); Esophagogastroduodenoscopy (egd) with propofol (N/A, 10/25/2018); Incision and drainage of wound (Left, 11/09/2018); and Esophagogastroduodenoscopy (egd) with propofol (N/A, 11/29/2019). Family: family history includes Alcohol abuse in her father; Arthritis in her maternal grandfather, maternal grandmother, mother, paternal grandfather, and paternal grandmother; Breast cancer in her maternal aunt; Cancer in her father and maternal aunt; Cerebral aneurysm in her father; Diabetes in her father, maternal grandmother, and paternal grandmother; Heart disease in her maternal grandfather; Hypertension in her father, maternal grandfather, maternal grandmother, paternal grandfather, and paternal grandmother; Kidney disease in her mother; Mental illness in her sister; Peripheral vascular disease in her father; Skin cancer in her father; Varicose Veins in her mother.  Laboratory Chemistry Profile   Renal Lab Results  Component Value Date   BUN 23 03/25/2021   CREATININE 1.19 (H) 03/25/2021   LABCREA 48 08/22/2020   BCR 17 02/06/2021    GFR 46.13 (L) 10/15/2020   GFRAA 58 (L) 12/25/2019   GFRNONAA 49 (L) 03/25/2021    Hepatic Lab Results  Component Value Date   AST 14 (L) 03/25/2021   ALT 9 03/25/2021   ALBUMIN 3.2 (L) 03/25/2021   ALKPHOS 94 03/25/2021   LIPASE 27 07/28/2018   AMMONIA <9 (L) 02/07/2020    Electrolytes Lab Results  Component Value Date   NA 137 03/25/2021   K 4.7 03/25/2021   CL 99 03/25/2021   CALCIUM 8.5 (L) 03/25/2021   MG 2.1 04/14/2020   PHOS 4.1 04/14/2020    Bone Lab Results  Component Value Date   VD25OH 35.97 08/22/2020    Inflammation (CRP: Acute Phase) (ESR: Chronic Phase) Lab Results  Component Value Date   CRP 3.1 (H) 04/08/2020   ESRSEDRATE 60 (H) 04/08/2020   LATICACIDVEN 1.1 12/20/2019         Note: Above Lab results reviewed.  Recent Imaging Review  DG Chest 2 View CLINICAL DATA:  Cough, asthma, shortness of breath  EXAM: CHEST - 2 VIEW  COMPARISON:  10/31/2020  FINDINGS: Transverse diameter of heart is increased. Right hemidiaphragm is elevated which may be due to eventration or paralysis. There are linear densities in the right lower lung fields. There are no signs of alveolar pulmonary edema or new focal pulmonary consolidation. There is no pleural effusion or pneumothorax. There is previous right shoulder arthroplasty.  IMPRESSION: Cardiomegaly. Linear densities in the right lower lung fields suggest scarring or subsegmental atelectasis. There are no signs  of pulmonary edema or new focal infiltrates.  Electronically Signed   By: Elmer Picker M.D.   On: 04/11/2021 15:36 Note: Reviewed        Physical Exam  General appearance: Well nourished, well developed, and well hydrated. In no apparent acute distress Mental status: Alert, oriented x 3 (person, place, & time)       Respiratory: No evidence of acute respiratory distress Eyes: PERLA Vitals: BP 124/88    Pulse (!) 57    Temp 98.4 F (36.9 C)    Ht _0  (1.676 m)    Wt 261 lb  (118.4 kg)    SpO2 97%    BMI 42.13 kg/m  BMI: Estimated body mass index is 42.13 kg/m as calculated from the following:   Height as of this encounter: _1  (1.676 m).   Weight as of this encounter: 261 lb (118.4 kg). Ideal: Ideal body weight: 59.3 kg (130 lb 11.7 oz) Adjusted ideal body weight: 82.9 kg (182 lb 13.4 oz)  Assessment   Status Diagnosis  Controlled Controlled Controlled 1. Chronic pain syndrome   2. Meralgia paresthetica (Right)   3. Chronic shoulder pain (Bilateral)   4. Osteoarthritis involving multiple joints   5. Fibromyalgia syndrome   6. Left hemiparesis ([place])   7. Pharmacologic therapy   8. Chronic use of opiate for therapeutic purpose   9. Encounter for medication management      Updated Problems: No problems updated.  Plan of Care  Problem-specific:  No problem-specific Assessment & Plan notes found for this encounter.  Ms. YEVETTE KNUST has a current medication list which includes the following long-term medication(s): albuterol, eliquis, bupropion, calcium-vitamin d, colchicine, duloxetine, furosemide, hydrocodone-acetaminophen, [START ON 06/11/2021] hydrocodone-acetaminophen, [START ON 07/11/2021] hydrocodone-acetaminophen, levocetirizine, levothyroxine, losartan, metformin, metoprolol succinate, montelukast, pantoprazole, pregabalin, rosuvastatin, sitagliptin, and gemtesa.  Pharmacotherapy (Medications Ordered): Meds ordered this encounter  Medications   HYDROcodone-acetaminophen (NORCO/VICODIN) 5-325 MG tablet    Sig: Take 1 tablet by mouth 2 (two) times daily as needed for severe pain. Must last 30 days    Dispense:  45 tablet    Refill:  0    DO NOT: delete (not duplicate); no partial-fill (will deny script to complete), no refill request (F/U required). DISPENSE: 1 day early if closed on fill date. WARN: No CNS-depressants within 8 hrs of med.   HYDROcodone-acetaminophen (NORCO/VICODIN) 5-325 MG tablet    Sig: Take 1 tablet by mouth 2 (two)  times daily as needed for severe pain. Must last 30 days    Dispense:  45 tablet    Refill:  0    DO NOT: delete (not duplicate); no partial-fill (will deny script to complete), no refill request (F/U required). DISPENSE: 1 day early if closed on fill date. WARN: No CNS-depressants within 8 hrs of med.   HYDROcodone-acetaminophen (NORCO/VICODIN) 5-325 MG tablet    Sig: Take 1 tablet by mouth 2 (two) times daily as needed for severe pain. Must last 30 days    Dispense:  45 tablet    Refill:  0    DO NOT: delete (not duplicate); no partial-fill (will deny script to complete), no refill request (F/U required). DISPENSE: 1 day early if closed on fill date. WARN: No CNS-depressants within 8 hrs of med.   Orders:  No orders of the defined types were placed in this encounter.  Follow-up plan:   Return in about 3 months (around 08/10/2021) for Eval-day (M,W), (F2F), (MM).      Interventional  Therapies  Risk   Complexity Considerations:   Estimated body mass index is 41.79 kg/m as calculated from the following:   Height as of this encounter: 5' 5.5" (1.664 m).   Weight as of this encounter: 255 lb (115.7 kg). NOTE: Eliquis ANTICOAGULATION (Stop: 3 days   Restart: 6 hours) NO LUMBAR RFA until BMI < 35.   Planned   Pending:   Pending further evaluation   Under consideration:   Diagnostic right suprascapular NB Diagnostic right IA knee injection  Possible series of 5 Hyalgan knee injections Diagnostic right-sided genicular NB Possible right-sided genicular nerve RFA  Diagnostic right-sided suprascapular NB  Possible right-sided suprascapular nerve RFA    Completed:   None at this time   Therapeutic   Palliative (PRN) options:   None at this time    Recent Visits No visits were found meeting these conditions. Showing recent visits within past 90 days and meeting all other requirements Today's Visits Date Type Provider Dept  05/12/21 Office Visit Milinda Pointer, MD [facility]  Showing today's visits and meeting all other requirements Future Appointments No visits were found meeting these conditions. Showing future appointments within next 90 days and meeting all other requirements  I discussed the assessment and treatment plan with the patient. The patient was provided an opportunity to ask questions and all were answered. The patient agreed with the plan and demonstrated an understanding of the instructions.  Patient advised to call back or seek an in-person evaluation if the symptoms or condition worsens.  Duration of encounter: 30 minutes.  Note by: Gaspar Cola, MD Date: 05/12/2021; Time: 11:01 AM

## 2021-05-12 ENCOUNTER — Ambulatory Visit: Payer: Medicare HMO | Attending: Pain Medicine | Admitting: Pain Medicine

## 2021-05-12 ENCOUNTER — Other Ambulatory Visit: Payer: Self-pay | Admitting: Medical

## 2021-05-12 ENCOUNTER — Other Ambulatory Visit: Payer: Self-pay

## 2021-05-12 ENCOUNTER — Other Ambulatory Visit: Payer: Self-pay | Admitting: Internal Medicine

## 2021-05-12 ENCOUNTER — Other Ambulatory Visit: Payer: Self-pay | Admitting: Obstetrics and Gynecology

## 2021-05-12 ENCOUNTER — Encounter: Payer: Self-pay | Admitting: Pain Medicine

## 2021-05-12 ENCOUNTER — Other Ambulatory Visit: Payer: Self-pay | Admitting: Urology

## 2021-05-12 VITALS — BP 124/88 | HR 57 | Temp 98.4°F | Ht 66.0 in | Wt 261.0 lb

## 2021-05-12 DIAGNOSIS — M159 Polyosteoarthritis, unspecified: Secondary | ICD-10-CM | POA: Insufficient documentation

## 2021-05-12 DIAGNOSIS — E1142 Type 2 diabetes mellitus with diabetic polyneuropathy: Secondary | ICD-10-CM

## 2021-05-12 DIAGNOSIS — G8194 Hemiplegia, unspecified affecting left nondominant side: Secondary | ICD-10-CM | POA: Diagnosis not present

## 2021-05-12 DIAGNOSIS — Z79891 Long term (current) use of opiate analgesic: Secondary | ICD-10-CM | POA: Diagnosis not present

## 2021-05-12 DIAGNOSIS — E039 Hypothyroidism, unspecified: Secondary | ICD-10-CM | POA: Diagnosis not present

## 2021-05-12 DIAGNOSIS — J449 Chronic obstructive pulmonary disease, unspecified: Secondary | ICD-10-CM | POA: Diagnosis not present

## 2021-05-12 DIAGNOSIS — M25511 Pain in right shoulder: Secondary | ICD-10-CM | POA: Insufficient documentation

## 2021-05-12 DIAGNOSIS — G894 Chronic pain syndrome: Secondary | ICD-10-CM | POA: Insufficient documentation

## 2021-05-12 DIAGNOSIS — E785 Hyperlipidemia, unspecified: Secondary | ICD-10-CM | POA: Diagnosis not present

## 2021-05-12 DIAGNOSIS — Z6841 Body Mass Index (BMI) 40.0 and over, adult: Secondary | ICD-10-CM | POA: Diagnosis not present

## 2021-05-12 DIAGNOSIS — M797 Fibromyalgia: Secondary | ICD-10-CM

## 2021-05-12 DIAGNOSIS — I509 Heart failure, unspecified: Secondary | ICD-10-CM | POA: Diagnosis not present

## 2021-05-12 DIAGNOSIS — I110 Hypertensive heart disease with heart failure: Secondary | ICD-10-CM | POA: Diagnosis not present

## 2021-05-12 DIAGNOSIS — Z79899 Other long term (current) drug therapy: Secondary | ICD-10-CM | POA: Diagnosis not present

## 2021-05-12 DIAGNOSIS — Z008 Encounter for other general examination: Secondary | ICD-10-CM | POA: Diagnosis not present

## 2021-05-12 DIAGNOSIS — R69 Illness, unspecified: Secondary | ICD-10-CM | POA: Diagnosis not present

## 2021-05-12 DIAGNOSIS — M25512 Pain in left shoulder: Secondary | ICD-10-CM | POA: Diagnosis not present

## 2021-05-12 DIAGNOSIS — E261 Secondary hyperaldosteronism: Secondary | ICD-10-CM | POA: Diagnosis not present

## 2021-05-12 DIAGNOSIS — I69334 Monoplegia of upper limb following cerebral infarction affecting left non-dominant side: Secondary | ICD-10-CM | POA: Diagnosis not present

## 2021-05-12 DIAGNOSIS — E1151 Type 2 diabetes mellitus with diabetic peripheral angiopathy without gangrene: Secondary | ICD-10-CM | POA: Diagnosis not present

## 2021-05-12 DIAGNOSIS — G8929 Other chronic pain: Secondary | ICD-10-CM | POA: Diagnosis not present

## 2021-05-12 DIAGNOSIS — I4891 Unspecified atrial fibrillation: Secondary | ICD-10-CM | POA: Diagnosis not present

## 2021-05-12 DIAGNOSIS — D6869 Other thrombophilia: Secondary | ICD-10-CM | POA: Diagnosis not present

## 2021-05-12 DIAGNOSIS — G5711 Meralgia paresthetica, right lower limb: Secondary | ICD-10-CM | POA: Diagnosis not present

## 2021-05-12 MED ORDER — NITROFURANTOIN MONOHYD MACRO 100 MG PO CAPS: 100.0000 mg | ORAL_CAPSULE | Freq: Two times a day (BID) | ORAL | 0 refills | Status: DC

## 2021-05-12 MED ORDER — HYDROCODONE-ACETAMINOPHEN 5-325 MG PO TABS: 1.0000 | ORAL_TABLET | Freq: Two times a day (BID) | ORAL | 0 refills | Status: DC | PRN

## 2021-05-12 NOTE — Progress Notes (Signed)
Nursing Pain Medication Assessment:  Safety precautions to be maintained throughout the outpatient stay will include: orient to surroundings, keep bed in low position, maintain call bell within reach at all times, provide assistance with transfer out of bed and ambulation.  Medication Inspection Compliance: Pill count conducted under aseptic conditions, in front of the patient. Neither the pills nor the bottle was removed from the patient's sight at any time. Once count was completed pills were immediately returned to the patient in their original bottle.  Medication: Hydrocodone/APAP Pill/Patch Count:  8 of 45 pills remain Pill/Patch Appearance: Markings consistent with prescribed medication Bottle Appearance: Standard pharmacy container. Clearly labeled. Filled Date: 1 / 25 / 2023 Last Medication intake:  YesterdaySafety precautions to be maintained throughout the outpatient stay will include: orient to surroundings, keep bed in low position, maintain call bell within reach at all times, provide assistance with transfer out of bed and ambulation.

## 2021-05-12 NOTE — Addendum Note (Signed)
Addended by: Jaquita Folds on: 05/12/2021 07:21 AM   Modules accepted: Orders

## 2021-05-12 NOTE — Progress Notes (Signed)
LM on the VM re: Medication sent to the pharmacy and to call us back if she has additional questions.

## 2021-05-12 NOTE — Progress Notes (Signed)
Pt was notified of the message from Dr. Wannetta Sender. Pt to stop the Bactrim and start Macrobid. Pt verbalized understanding

## 2021-05-13 ENCOUNTER — Inpatient Hospital Stay: Payer: Medicare HMO

## 2021-05-13 ENCOUNTER — Telehealth: Payer: Self-pay | Admitting: Internal Medicine

## 2021-05-13 VITALS — BP 127/62 | HR 48 | Temp 97.0°F | Resp 18

## 2021-05-13 DIAGNOSIS — D649 Anemia, unspecified: Secondary | ICD-10-CM | POA: Diagnosis not present

## 2021-05-13 DIAGNOSIS — Z79899 Other long term (current) drug therapy: Secondary | ICD-10-CM | POA: Diagnosis not present

## 2021-05-13 DIAGNOSIS — D508 Other iron deficiency anemias: Secondary | ICD-10-CM

## 2021-05-13 MED ORDER — SODIUM CHLORIDE 0.9 % IV SOLN: 200.0000 mg | INTRAVENOUS | Status: DC

## 2021-05-13 MED ORDER — IRON SUCROSE 20 MG/ML IV SOLN
200.0000 mg | Freq: Once | INTRAVENOUS | Status: AC
  Administered 2021-05-13: 200 mg via INTRAVENOUS
  Filled 2021-05-13: qty 10

## 2021-05-13 MED ORDER — SODIUM CHLORIDE 0.9 % IV SOLN
Freq: Once | INTRAVENOUS | Status: AC
  Filled 2021-05-13: qty 250

## 2021-05-13 NOTE — Telephone Encounter (Signed)
Can you resume taking over gabapentin and lyrica for your pain patients?

## 2021-05-13 NOTE — Patient Instructions (Signed)

## 2021-05-14 DIAGNOSIS — G4733 Obstructive sleep apnea (adult) (pediatric): Secondary | ICD-10-CM | POA: Diagnosis not present

## 2021-05-15 ENCOUNTER — Other Ambulatory Visit: Payer: Self-pay

## 2021-05-15 ENCOUNTER — Encounter: Payer: Medicare HMO | Admitting: Physician Assistant

## 2021-05-15 DIAGNOSIS — L97822 Non-pressure chronic ulcer of other part of left lower leg with fat layer exposed: Secondary | ICD-10-CM | POA: Diagnosis not present

## 2021-05-15 DIAGNOSIS — Z6839 Body mass index (BMI) 39.0-39.9, adult: Secondary | ICD-10-CM | POA: Diagnosis not present

## 2021-05-15 DIAGNOSIS — L97812 Non-pressure chronic ulcer of other part of right lower leg with fat layer exposed: Secondary | ICD-10-CM | POA: Diagnosis not present

## 2021-05-15 DIAGNOSIS — I10 Essential (primary) hypertension: Secondary | ICD-10-CM | POA: Diagnosis not present

## 2021-05-15 DIAGNOSIS — E669 Obesity, unspecified: Secondary | ICD-10-CM | POA: Diagnosis not present

## 2021-05-15 DIAGNOSIS — Z86718 Personal history of other venous thrombosis and embolism: Secondary | ICD-10-CM | POA: Diagnosis not present

## 2021-05-15 DIAGNOSIS — L97522 Non-pressure chronic ulcer of other part of left foot with fat layer exposed: Secondary | ICD-10-CM | POA: Diagnosis not present

## 2021-05-15 DIAGNOSIS — E1142 Type 2 diabetes mellitus with diabetic polyneuropathy: Secondary | ICD-10-CM | POA: Diagnosis not present

## 2021-05-15 DIAGNOSIS — E11621 Type 2 diabetes mellitus with foot ulcer: Secondary | ICD-10-CM | POA: Diagnosis not present

## 2021-05-15 DIAGNOSIS — I872 Venous insufficiency (chronic) (peripheral): Secondary | ICD-10-CM | POA: Diagnosis not present

## 2021-05-15 DIAGNOSIS — Z7901 Long term (current) use of anticoagulants: Secondary | ICD-10-CM | POA: Diagnosis not present

## 2021-05-15 DIAGNOSIS — L98492 Non-pressure chronic ulcer of skin of other sites with fat layer exposed: Secondary | ICD-10-CM | POA: Diagnosis not present

## 2021-05-15 NOTE — Progress Notes (Addendum)
[name] ([id number]) Visit Report for 05/15/2021 Arrival Information Details Patient Name: [name] Date of Service: 05/15/2021 2:45 PM Medical Record Number: [id number] Patient Account Number: [id number] Date of Birth/Sex: [birth year] (71 y.o. F) Treating RN: Levora Dredge Primary Care Adalberto Metzgar: [name], Olivia Mackie Other Clinician: Referring Roney Youtz: [name], Olivia Mackie Treating Quinlan Mcfall/Extender: Skipper Cliche in Treatment: 4 Visit Information History Since Last Visit Added or deleted any medications: Yes Patient Arrived: Wheel Chair Any new allergies or adverse reactions: No Arrival Time: 15:01 Had a fall or experienced change in No Accompanied By: husband activities of daily living that may affect Transfer Assistance: EasyPivot Patient Lift risk of falls: Patient Identification Verified: Yes Hospitalized since last visit: No Secondary Verification Process Completed: Yes Has Dressing in Place as Prescribed: Yes Patient Requires Transmission-Based No Pain Present Now: Yes Precautions: Patient Has Alerts: Yes Patient Alerts: Patient on Blood Thinner Eliquis Diabetic II 06/2020 ABI L)1.22 R) 1.14 Electronic Signature(s) Signed: 05/15/2021 4:04:42 PM By: Levora Dredge Entered By: Levora Dredge on 05/15/2021 15:02:33 [name] ([id number]) -------------------------------------------------------------------------------- Clinic Level of Care Assessment Details Patient Name: [name] Date of Service: 05/15/2021 2:45 PM Medical Record Number: [id number] Patient Account Number: [id number] Date of Birth/Sex: Jun 23, 1950 (71 y.o. F) Treating RN: Levora Dredge Primary Care Weslynn Ke: [name], Olivia Mackie Other Clinician: Referring Shawana Knoch: [name], Olivia Mackie Treating Calyssa Zobrist/Extender: Skipper Cliche in Treatment: 59 Clinic Level of Care Assessment Items TOOL 4 Quantity Score []  - Use when only an EandM is performed on  FOLLOW-UP visit 0 ASSESSMENTS - Nursing Assessment / Reassessment []  - Reassessment of Co-morbidities (includes updates in patient status) 0 X- 1 5 Reassessment of Adherence to Treatment Plan ASSESSMENTS - Wound and Skin Assessment / Reassessment []  - Simple Wound Assessment / Reassessment - one wound 0 X- 2 5 Complex Wound Assessment / Reassessment - multiple wounds []  - 0 Dermatologic / Skin Assessment (not related to wound area) ASSESSMENTS - Focused Assessment X - Circumferential Edema Measurements - multi extremities 2 5 []  - 0 Nutritional Assessment / Counseling / Intervention []  - 0 Lower Extremity Assessment (monofilament, tuning fork, pulses) []  - 0 Peripheral Arterial Disease Assessment (using hand held doppler) ASSESSMENTS - Ostomy and/or Continence Assessment and Care []  - Incontinence Assessment and Management 0 []  - 0 Ostomy Care Assessment and Management (repouching, etc.) PROCESS - Coordination of Care X - Simple Patient / Family Education for ongoing care 1 15 []  - 0 Complex (extensive) Patient / Family Education for ongoing care []  - 0 Staff obtains Programmer, systems, Records, Test Results / Process Orders []  - 0 Staff telephones HHA, Nursing Homes / Clarify orders / etc []  - 0 Routine Transfer to another Facility (non-emergent condition) []  - 0 Routine Hospital Admission (non-emergent condition) []  - 0 New Admissions / Biomedical engineer / Ordering NPWT, Apligraf, etc. []  - 0 Emergency Hospital Admission (emergent condition) []  - 0 Simple Discharge Coordination X- 1 15 Complex (extensive) Discharge Coordination PROCESS - Special Needs []  - Pediatric / Minor Patient Management 0 []  - 0 Isolation Patient Management []  - 0 Hearing / Language / Visual special needs []  - 0 Assessment of Community assistance (transportation, D/C planning, etc.) []  - 0 Additional assistance / Altered mentation []  - 0 Support Surface(s) Assessment (bed, cushion, seat,  etc.) INTERVENTIONS - Wound Cleansing / Measurement [name], [name] ([id number]) []  - 0 Simple Wound Cleansing - one wound X- 2 5 Complex Wound Cleansing - multiple wounds []  - 0 Wound Imaging (photographs - any number of wounds)  X- 1 5 Wound Tracing (instead of photographs) []  - 0 Simple Wound Measurement - one wound X- 2 5 Complex Wound Measurement - multiple wounds INTERVENTIONS - Wound Dressings X - Small Wound Dressing one or multiple wounds 2 10 []  - 0 Medium Wound Dressing one or multiple wounds []  - 0 Large Wound Dressing one or multiple wounds X- 1 5 Application of Medications - topical []  - 0 Application of Medications - injection INTERVENTIONS - Miscellaneous []  - External ear exam 0 []  - 0 Specimen Collection (cultures, biopsies, blood, body fluids, etc.) []  - 0 Specimen(s) / Culture(s) sent or taken to Lab for analysis []  - 0 Patient Transfer (multiple staff / Civil Service fast streamer / Similar devices) []  - 0 Simple Staple / Suture removal (25 or less) []  - 0 Complex Staple / Suture removal (26 or more) []  - 0 Hypo / Hyperglycemic Management (close monitor of Blood Glucose) []  - 0 Ankle / Brachial Index (ABI) - do not check if billed separately X- 1 5 Vital Signs Has the patient been seen at the hospital within the last three years: Yes Total Score: 110 Level Of Care: New/Established - Level 3 Electronic Signature(s) Signed: 05/15/2021 4:04:42 PM By: Levora Dredge Entered By: Levora Dredge on 05/15/2021 16:03:07 [name] ([id number]) -------------------------------------------------------------------------------- Encounter Discharge Information Details Patient Name: [name] Date of Service: 05/15/2021 2:45 PM Medical Record Number: [id number] Patient Account Number: [id number] Date of Birth/Sex: 31-Aug-1950 (70 y.o. F) Treating RN: Levora Dredge Primary Care Montel Vanderhoof: [name], Olivia Mackie Other Clinician: Referring Aradhya Shellenbarger:  [name], Olivia Mackie Treating Lorie Cleckley/Extender: Skipper Cliche in Treatment: 45 Encounter Discharge Information Items Discharge Condition: Stable Ambulatory Status: Wheelchair Discharge Destination: Home Transportation: Private Auto Accompanied By: husband Schedule Follow-up Appointment: Yes Clinical Summary of Care: Electronic Signature(s) Signed: 05/15/2021 4:04:07 PM By: Levora Dredge Entered By: Levora Dredge on 05/15/2021 16:04:07 [name] ([id number]) -------------------------------------------------------------------------------- Lower Extremity Assessment Details Patient Name: [name] Date of Service: 05/15/2021 2:45 PM Medical Record Number: [id number] Patient Account Number: [id number] Date of Birth/Sex: 08/15/1950 (71 y.o. F) Treating RN: Levora Dredge Primary Care Judine Arciniega: [name], Olivia Mackie Other Clinician: Referring Aowyn Rozeboom: [name], Olivia Mackie Treating Laureen Frederic/Extender: Skipper Cliche in Treatment: 45 Edema Assessment Assessed: [Left: No] [Right: No] Edema: [Left: Ye] [Right: s] Calf Left: Right: Point of Measurement: 32 cm From Medial Instep 35.5 cm Ankle Left: Right: Point of Measurement: 9 cm From Medial Instep 19 cm Vascular Assessment Pulses: Dorsalis Pedis Palpable: [Right:Yes] Electronic Signature(s) Signed: 05/15/2021 4:04:42 PM By: Levora Dredge Entered By: Levora Dredge on 05/15/2021 15:27:36 Weisensel, [name] ([id number]) -------------------------------------------------------------------------------- Multi Wound Chart Details Patient Name: [name] Date of Service: 05/15/2021 2:45 PM Medical Record Number: [id number] Patient Account Number: [id number] Date of Birth/Sex: 1951/03/16 (70 y.o. 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 N/A Extremity Comorbid History: Asthma, Pneumothorax, Asthma, Pneumothorax, N/A Arrhythmia, Hypertension, Type II Arrhythmia, Hypertension, Type II Diabetes, Gout, Osteoarthritis, Diabetes, Gout, Osteoarthritis, Neuropathy Neuropathy Date Acquired: 04/01/2021 05/15/2021 N/A Weeks of Treatment: 5 0 N/A Wound Status: Open Open N/A Wound Recurrence: No No N/A Measurements L x W x D (cm) 0.3x0.4x0.1 2.4x2.6x0.1 N/A Area (  cm) : 0.094 4.901 N/A Volume (cm) : 0.009 0.49 N/A % Reduction in Area: 52.00% N/A N/A % Reduction in Volume: 90.80% N/A N/A Classification: Grade 2 Full Thickness Without Exposed N/A Support Structures Exudate Amount: Medium Medium N/A Exudate Type: Serosanguineous Serosanguineous N/A Exudate Color: red, brown red, brown N/A Granulation Amount: Large (67-100%) Medium (34-66%) N/A Granulation Quality: Pink Red N/A Necrotic Amount: Small (1-33%) Medium (34-66%) N/A Exposed Structures: Fat Layer (Subcutaneous Tissue): Fat Layer (Subcutaneous Tissue): N/A Yes Yes Fascia: No Tendon: No Muscle: No Joint: No Bone: No Epithelialization: None Small (1-33%) N/A Treatment Notes Electronic Signature(s) Signed: 05/15/2021 4:04:42 PM By: Levora Dredge Entered By: Levora Dredge on 05/15/2021 15:28:03 ANNMARGARET, DECAPRIO ([id number]) ELDEAN, KLATT ([id number]) -------------------------------------------------------------------------------- Coates Details Patient Name: [name] Date of Service: 05/15/2021 2:45 PM Medical Record Number: [id number] Patient Account Number:  [id number] Date of Birth/Sex: 11-02-1950 (71 y.o. F) Treating RN: Levora Dredge Primary Care Takyla Kuchera: [name], Olivia Mackie Other Clinician: Referring Imogene Gravelle: [name], Olivia Mackie Treating Cristel Rail/Extender: Skipper Cliche in Treatment: 45 Active Inactive Electronic Signature(s) Signed: 05/15/2021 4:04:42 PM By: Levora Dredge Entered By: Levora Dredge on 05/15/2021 15:27:52 TAUNJA, BRICKNER ([id number]) -------------------------------------------------------------------------------- Pain Assessment Details Patient Name: [name] Date of Service: 05/15/2021 2:45 PM Medical Record Number: [id number] Patient Account Number: [id number] Date of Birth/Sex: 26-Oct-1950 (71 y.o. F) Treating RN: Levora Dredge Primary Care Signora Zucco: [name], Olivia Mackie Other Clinician: Referring Feleshia Zundel: [name], Olivia Mackie Treating Kaela Beitz/Extender: Skipper Cliche in Treatment: 45 Active Problems Location of Pain Severity and Description of Pain Patient Has Paino Yes Site Locations Rate the pain. Current Pain Level: 8 Pain Management and Medication Current Pain Management: Notes pt states pain in left shoulder Electronic Signature(s) Signed: 05/15/2021 4:04:42 PM By: Levora Dredge Entered By: Levora Dredge on 05/15/2021 15:04:57 [name] ([id number]) -------------------------------------------------------------------------------- Patient/Caregiver Education Details Patient Name: [name] Date of Service: 05/15/2021 2:45 PM Medical Record Number: [id number] Patient Account Number: [id number] Date of Birth/Gender: 01-31-1951 (70 y.o. F) Treating RN: Levora Dredge Primary Care Physician: [name], Olivia Mackie Other Clinician: Referring Physician: [name], Olivia Mackie Treating Physician/Extender: Skipper Cliche in Treatment: 62 Education Assessment Education Provided To: Patient and Caregiver Education Topics Provided Wound/Skin  Impairment: Handouts: Caring for Your Ulcer Methods: Explain/Verbal Responses: State content correctly Electronic Signature(s) Signed: 05/15/2021 4:04:42 PM By: Levora Dredge Entered By: Levora Dredge on 05/15/2021 16:03:29 JEIRY, BIRNBAUM ([id number]) -------------------------------------------------------------------------------- Wound Assessment Details Patient Name: [name] Date of Service: 05/15/2021 2:45 PM Medical Record Number: [id number] Patient Account Number: [id number] Date of Birth/Sex: 02-22-1951 (70 y.o. F) Treating RN: Levora Dredge Primary Care Brodin Gelpi: [name], Olivia Mackie Other Clinician: Referring Vandana Haman: [name], Olivia Mackie Treating Abby Tucholski/Extender: Skipper Cliche in Treatment: 51 Wound Status Wound Number: 18 Primary Diabetic Wound/Ulcer of the Lower Extremity Etiology: Wound Location: Right, Anterior Lower Leg Wound Open Wounding Event: Blister Status: Date Acquired: 04/01/2021 Comorbid Asthma, Pneumothorax, Arrhythmia, Hypertension, Type Weeks Of Treatment: 5 History: II Diabetes, Gout, Osteoarthritis, Neuropathy Clustered Wound: No Photos Wound Measurements Length: (cm) 0.3 % Red Width: (cm) 0.4 % Red Depth: (cm) 0.1 Epith Area: (cm) 0.094 Tunn Volume: (cm) 0.009 Unde uction in Area: 52% uction in Volume: 90.8% elialization: None eling: No rmining: No Wound Description Classification: Grade 2 Foul Exudate Amount: Medium Sloug Exudate Type: Serosanguineous Exudate Color: red, brown Odor After Cleansing: No h/Fibrino Yes Wound Bed Granulation Amount: Large (67-100%) Exposed Structure Granulation Quality: Pink Fascia Exposed: No Necrotic Amount: Small (1-33%) Fat Layer (Subcutaneous Tissue) Exposed: Yes Necrotic Quality: Adherent Slough  Tendon Exposed: No Muscle Exposed: No Joint Exposed: No Bone Exposed: No Treatment Notes Wound #18 (Lower Leg) Wound Laterality: Right, Anterior Cleanser Peri-Wound  Care Topical Primary Dressing KEIRSTON, SAEPHANH ([id number]) Hydrofera Blue Ready Transfer Foam, 2.5x2.5 (in/in) Discharge Instruction: Apply Hydrofera Blue Ready to wound bed as directed Secondary Dressing tegaderm Discharge Instruction: applied to secure HB Secured With Compression Wrap Compression Stockings Add-Ons Electronic Signature(s) Signed: 05/15/2021 4:04:42 PM By: Levora Dredge Entered By: Levora Dredge on 05/15/2021 15:16:49 Uyeno, [name] ([id number]) -------------------------------------------------------------------------------- Wound Assessment Details Patient Name: [name] Date of Service: 05/15/2021 2:45 PM Medical Record Number: [id number] Patient Account Number: [id number] Date of Birth/Sex: [birth year] (71 y.o. F) Treating RN: Levora Dredge Primary Care Yulia Ulrich: [name], Olivia Mackie Other Clinician: Referring Satori Krabill: [name], Olivia Mackie Treating Eulalia Ellerman/Extender: Skipper Cliche in Treatment: 34 Wound Status Wound Number: 21 Primary Skin Tear Etiology: Wound Location: Right Elbow Wound Open Wounding Event: Skin Tear/Laceration Status: Date Acquired: 05/15/2021 Comorbid Asthma, Pneumothorax, Arrhythmia, Hypertension, Type Weeks Of Treatment: 0 History: II Diabetes, Gout, Osteoarthritis, Neuropathy Clustered Wound: No Photos Wound Measurements Length: (cm) 2.4 Width: (cm) 2.6 Depth: (cm) 0.1 Area: (cm) 4.901 Volume: (cm) 0.49 % Reduction in Area: % Reduction in Volume: Epithelialization: Small (1-33%) Tunneling: No Undermining: No Wound Description Classification: Full Thickness Without Exposed Support Structures Exudate Amount: Medium Exudate Type: Serosanguineous Exudate Color: red, brown Foul Odor After Cleansing: No Slough/Fibrino Yes Wound Bed Granulation Amount: Medium (34-66%) Exposed Structure Granulation Quality: Red Fat Layer (Subcutaneous Tissue) Exposed: Yes Necrotic Amount: Medium (34-66%) Necrotic  Quality: Adherent Slough Treatment Notes Wound #21 (Elbow) Wound Laterality: Right Cleanser Normal Saline Discharge Instruction: Wash your hands with soap and water. Remove old dressing, discard into plastic bag and place into trash. Cleanse the wound with Normal Saline prior to applying a clean dressing using gauze sponges, not tissues or cotton balls. Do not scrub or use excessive force. Pat dry using gauze sponges, not tissue or cotton balls. Peri-Wound Care Topical SANSKRITI, GREENLAW B. ([id number]) Primary Dressing steri strips applied Discharge Instruction: 3 placed on skin tear PLEASE DO NOT REMOVE STERI STRIPS, LEAVE IN PLACE Secondary Dressing ABD Pad 5x9 (in/in) Discharge Instruction: Cover with ABD pad Kerlix 4.5 x 4.1 (in/yd) Discharge Instruction: Apply Kerlix 4.5 x 4.1 (in/yd) as instructed Secured With Stretch Net Size 10 (yds) Discharge Instruction: To secure dressing in place. Compression Wrap Compression Stockings Add-Ons Electronic Signature(s) Signed: 05/15/2021 4:04:42 PM By: Levora Dredge Entered By: Levora Dredge on 05/15/2021 15:19:24 LAURALYN, SHADOWENS ([id number]) -------------------------------------------------------------------------------- Vitals Details Patient Name: [name] Date of Service: 05/15/2021 2:45 PM Medical Record Number: [id number] Patient Account Number: [id number] Date of Birth/Sex: 01-Aug-1950 (71 y.o. F) Treating RN: Levora Dredge Primary Care Gentry Pilson: [name], Olivia Mackie Other Clinician: Referring Aveion Nguyen: [name], Olivia Mackie Treating Leonetta Mcgivern/Extender: Skipper Cliche in Treatment: 45 Vital Signs Time Taken: 15:04 Temperature (F): 98.7 Height (in): 65.5 Pulse (bpm): 62 Weight (lbs): 240 Respiratory Rate (breaths/min): 18 Body Mass Index (BMI): 39.3 Blood Pressure (mmHg): 167/95 Reference Range: 80 - 120 mg / dl Electronic Signature(s) Signed: 05/15/2021 4:04:42 PM By: Levora Dredge Entered By:  Levora Dredge on 05/15/2021 15:04:31

## 2021-05-15 NOTE — Progress Notes (Addendum)
INDIYA, IZQUIERDO ([id number]) Visit Report for 05/15/2021 Chief Complaint Document Details Patient Name: Dawn, [name] Date of Service: 05/15/2021 2:45 PM Medical Record Number: [id number] Patient Account Number: [id number] Date of Birth/Sex: 1950/07/03 (71 y.o. F) Treating RN: Levora Dredge Primary Care Provider: [name], Olivia Mackie Other Clinician: Referring Provider: [name], Olivia Mackie Treating Provider/Extender: Skipper Cliche in Treatment: 41 Information Obtained from: Patient Chief Complaint Bilateral Leg Ulcers and Right 4thToe Ulcer Electronic Signature(s) Signed: 05/15/2021 3:00:56 PM By: Worthy Keeler PA-C Entered By: Worthy Keeler on 05/15/2021 15:00:56 [name], [name] ([id number]) -------------------------------------------------------------------------------- HPI Details Patient Name: [name] Date of Service: 05/15/2021 2:45 PM Medical Record Number: [id number] Patient Account Number: [id number] Date of Birth/Sex: 04/30/1950 (70 y.o. F) Treating RN: Levora Dredge Primary Care Provider: [name], Olivia Mackie Other Clinician: Referring Provider: [name], Olivia Mackie Treating Provider/Extender: Skipper Cliche in Treatment: 19 History of Present Illness HPI Description: 06/28/20 upon evaluation today patient appears to be doing unfortunately somewhat poorly in regard to wounds that she has over her lower extremities. She has a wound on the left distal second toe, left posterior heel, and right anterior lower leg. With that being said all in all none of the wounds appear to be too bad the one that hurts her the most is actually the wound on the posterior heel which actually is also one of the smallest. Nonetheless I think there is some callus hiding what is really going on in this area. Fortunately there does not appear to be any obvious evidence of infection which is great news. Patient does have a history of diabetes mellitus type 2, chronic  venous insufficiency, hypertension, history of DVTs, and is on long-term anticoagulant therapy, Eliquis. 07/05/20 upon evaluation today patient appears to be doing well currently with regard to her wounds. I feel like she is making progress which is great news and overall there is no signs of active infection at this time. No fevers, chills, nausea, vomiting, or diarrhea. 4/29; patient has a only a small area remaining on the tip of her left second toe and a small area remaining on the left heel. The area on the right anterior lower leg is healed. They tell me that she had remote podiatric surgery on the toes of the left foot however they are very fixed in position and I wonder whether they are making it difficult to relieve pressure in her foot wear. She is a type II diabetic with neuropathy as well. 08/01/2020 upon evaluation today patient appears to be doing well currently in regard to her wounds. In fact everything appears to be doing much better. The posterior aspect of her heel is actually giving her some trouble not the Achilles area but a small wound that we did not even have marked. In fact I had noted this before. Nonetheless we are going to addressed that today. 5/27; most of the patient's wounds are healed including the left Achilles heel left toe. Right forearm is also healed. She has a new abrasion posteriorly in the right elbow area just above the olecranon this happened today with an abrasion from her car door 6/15; the patient's wounds on the left Achilles and left second toe remain healed. The area on the right forearm is also just about healed in fact the skin I replaced last time is looking viable which is good. She had me look at the left heel again which is not in the area of the wound more towards the tip of the heel at  the Achilles insertion site as well as the tip of the left foot second toe. In the Achilles area that is clearly is friction probably in bed at night. Not really  near where the wound was. The left second toe remains healed although this is hammered and overhanging first toe also puts pressure in this area 09/13/2020 upon evaluation today patient appears to be doing well with regard to all previous wounds that she had. Everything is completely healed. With that being said I do think that the patient unfortunately has a new skin tear on the right forearm which is due to her put a Band-Aid on it and then when it pulled off this caused some issues with skin tearing. Nonetheless I think that this needs to be addressed today. Hopefully will take too long to get this to heal. She is having some issues with pain in her heel but I think this is more related to be honest to her neuropathy as opposed to anything else. Readmission: 10/30/2020 upon evaluation today patient appears to be doing poorly in regard to her legs bilaterally. She is also having an issue with her fourth toe on the left. Fortunately there does not appear to be any signs of active infection at this time. No fevers, chills, nausea, vomiting, or diarrhea. It is actually been quite a while since we have seen her. In fact June 24 was the last time that she was here in the clinic. She tells me she did not come back because things were just doing "pretty well". 11/29/2020 upon evaluation today patient actually appears to be doing well with regard to her arms those are completely healed. Her right leg is still open but doing okay. The fourth toe of the left foot does show some signs here of having some issues with necrotic tissue of an open wound. This is where a toenail was removed by podiatry. Subsequently I do think that we need to go ahead and see what we can do about getting this cleared away so being try to get some healing going forward. 12/13/2020 upon evaluation today patient appears to be doing decently well in regard to her wound on the right leg. In general I think that she is making good progress  here. Unfortunately she continues to have significant issues with swelling in the left leg is now even more swollen at this point. For that reason I do believe she would be benefited by wrapping both sides although she is not interested in doing this. Overall I think that she is definitely making some good progress here nonetheless in regard to the right leg left leg leave some to be desired. 12/27/2020 upon evaluation today patient appears to be doing well with regard to her wound in regard to the legs. Unfortunately the toe was not doing nearly as well. I am much more concerned about infection and osteomyelitis here. She had the MRI but right now were not seeing obvious evidence of redness spreading up to her foot this is good news I do not have the result of the MRI as of yet. I am good to place her on doxycycline however due to the fact that the wound does seem to be getting a little worse in regard to her toe. 01/14/2021 upon evaluation today patient appears to be doing well with regard to her ulcers. The right leg is doing well and even the toe seems to be doing well though she still has some evidence of bone exposed on  the toe which does not appear to be doing quite as good as I would like to see just and the fact that is still not covered over new tissue but looks tremendously better compared to 2 weeks ago. Overall very pleased in that regard. 01/28/2021 upon evaluation today patient's leg on the left actually appears to have new wounds this is due to her deciding to shave yesterday. Fortunately there does not appear to be any signs of infection currently. This is good news. Nonetheless I do believe that with these new areas open it would be best to wrap her leg to try to make sure we get this under control sooner rather than later. With regard to her toe there still does appear to be some evidence of bone exposed this was covered over with callus I did remove that today. Nonetheless I think  this is significantly smaller than previous. 02/11/2021 upon evaluation today patient appears to be doing okay in regard to her leg ulcers. I do not see anything obvious at this point this seems to be a complicating factor. I think that she is getting better. Were wrap of the right leg not the left leg. With that being said the left fourth Lacson, Zarie B. ([id number]) toe ulcer still does have evidence of being open there is some drainage still on top of the wound I think we can to try to see about using something like a collagen dressing today to see if that would be of benefit for her. She is in agreement with giving this a trial. Nonetheless I am concerned about the fact that she continues to have issues here with this not healing it is confirmed chronic osteomyelitis she has been on antibiotics. I am going to extend those today as well. That is Keflex and that will be for 1 additional month. Nonetheless if she is not doing significantly better by the next time I see her I think that the biggest thing is going to be for [place] to go ahead and see what we can do about getting her started with hyperbaric oxygen therapy to try to save the toe. 02/20/2021 upon evaluation today patient presents for follow-up she actually came in a little bit early as the home health nurse was concerned about her toe becoming "gangrenous". With that being said the good news is there is [place] no evidence of this and in fact the toe seems to be doing better at this point. What was over the toe was an area that had bled significantly and then subsequently dried making it look much worse than where things actually stand. There is just a very small opening remaining in the toe. In regard to her legs everything appears to be healed bilaterally. 02/24/2021 upon evaluation today patient appears to be doing well with regard to her toe ulcer. I am actually very pleased with where we stand and I think that she is making good progress  at this time. There is still a very small opening although I think the biggest issue is the collagen seems to be getting stuck and drying up because the wound is so small we probably just need to use something to keep this moist like a little piece of Xeroform gauze and see if we get this to close. 03/07/2021 upon evaluation patient actually appears to be making excellent progress in regard to her last wound on the toe. There is just a very tiny potential area that I can even be sure at first  until I get some of the dry skin off when I did get this removed there was just a very tiny pinpoint opening that was barely even moist. Actually feel like this is very close to complete resolution. She has been using the Xeroform that is doing great. 03/25/2021 upon evaluation today patient appears to be doing well with regard to her toe ulcer. In fact this appears to be potentially completely healed. Fortunately I do not see anything draining at this point which is great news. She does have a small blood blister that is already drying up and looking okay on the third toe as well I am not sure what happened here she has an either. Otherwise she is having some weeping from the right leg this again is newer she tells me from last week weighing in that her pulmonologist and then today weighing in at the hematologist that she gained 30 pounds. This is quite significant if indeed true and I think she is to contact her primary care provider ASAP. This would account for why her right leg is weeping as well. 04/04/2020 upon evaluation today patient appears to be doing excellent in regard to her toe which I actually think is healed today. With that being said she unfortunately is having issues with her leg swelling and again several blisters on each leg that have reopened unfortunately. 05/02/2021 upon evaluation today patient appears to be doing well with regard to her wounds. In fact is down just 1 on the anterior portion  of her right leg which is actually from a blister. Otherwise she is doing awesome. 05/15/2021 upon evaluation today patient appears to be doing better in regard to her leg ulcer although she has a small irritated area from tape on the leg medially. She also has a skin tear to her right elbow region. Fortunately there does not appear to be any signs of active infection locally nor systemically at this point which is great news. No fevers, chills, nausea, vomiting, or diarrhea. Electronic Signature(s) Signed: 05/15/2021 3:51:58 PM By: Worthy Keeler PA-C Entered By: Worthy Keeler on 05/15/2021 15:51:58 [name], [name] ([id number]) -------------------------------------------------------------------------------- Physical Exam Details Patient Name: [name] Date of Service: 05/15/2021 2:45 PM Medical Record Number: [id number] Patient Account Number: [id number] Date of Birth/Sex: 1950/07/18 (71 y.o. F) Treating RN: Levora Dredge Primary Care Provider: [name], Olivia Mackie Other Clinician: Referring Provider: [name], Olivia Mackie Treating Provider/Extender: Skipper Cliche in Treatment: 78 Constitutional Well-nourished and well-hydrated in no acute distress. Respiratory normal breathing without difficulty. Psychiatric this patient is able to make decisions and demonstrates good insight into disease process. Alert and Oriented x 3. pleasant and cooperative. Notes Upon inspection patient's wounds appear to be doing quite well. On the elbow region I do believe that I can approximate this and likely get to heal much more effectively and quickly. She is in agreement with this plan. I therefore did use Steri-Strips in order to approximate. I used Skin-Prep initially so these would stay in place. She tolerated that without complication today. In regard to the leg I Georgina Peer continue to recommend the [facility] that is what she has been using at home and it seems to be doing well so  we will stick with that plan. Electronic Signature(s) Signed: 05/15/2021 3:52:29 PM By: Worthy Keeler PA-C Entered By: Worthy Keeler on 05/15/2021 15:52:29 [name], [name] ([id number]) -------------------------------------------------------------------------------- Physician Orders Details Patient Name: [name] Date of Service: 05/15/2021 2:45 PM Medical Record Number: [id number]  Patient Account Number: [id number] Date of Birth/Sex: 06/14/1950 (71 y.o. F) Treating RN: Levora Dredge Primary Care Provider: [name], Olivia Mackie Other Clinician: Referring Provider: [name], Olivia Mackie Treating Provider/Extender: Skipper Cliche in Treatment: 15 Verbal / Phone Orders: No Diagnosis Coding ICD-10 Coding Code Description E11.621 Type 2 diabetes mellitus with foot ulcer L97.522 Non-pressure chronic ulcer of other part of left foot with fat layer exposed I87.2 Venous insufficiency (chronic) (peripheral) L97.812 Non-pressure chronic ulcer of other part of right lower leg with fat layer exposed L97.822 Non-pressure chronic ulcer of other part of left lower leg with fat layer exposed Z79.01 Long term (current) use of anticoagulants Follow-up Appointments o Return Appointment in 1 week. o Nurse Visit as needed [place] for wound care. May utilize formulary equivalent dressing for wound treatment orders unless otherwise specified. Home Health Nurse may visit PRN to address patientos wound care needs. - home health to see on tuesday and we will see on friday o Scheduled days for dressing changes to be completed; exception, patient has scheduled wound care visit that day. o **Please direct any NON-WOUND related issues/requests for orders to patient's Primary Care Physician. **If current dressing causes regression in wound condition, may D/C ordered dressing product/s and apply Normal Saline Moist Dressing  daily until next [place] or Other MD appointment. **Notify [facility] of regression in wound condition at [phone]. o Other Home Health Orders/Instructions: - PLEASE DO NOT REMOVE STERI STRIPS FROM RIGHT ELBOW Bathing/ Shower/ Hygiene o May shower with wound dressing protected with water repellent cover or cast protector. o No tub bath. o Other: - Be careful of new skin to protect-do not shave Anesthetic (Use 'Patient Medications' Section for Anesthetic Order Entry) o Lidocaine applied to wound bed Edema Control - Lymphedema / Segmental Compressive Device / Other o Patient to wear own compression stockings. Remove compression stockings every night before going to bed and put on every morning when getting up. o Elevate, Exercise Daily and Avoid Standing for Long Periods of Time. o Elevate legs to the level of the heart and pump ankles as often as possible o Elevate leg(s) parallel to the floor when sitting. o DO YOUR BEST to sleep in the bed at night. DO NOT sleep in your recliner. Long hours of sitting in a recliner leads to swelling of the legs and/or potential wounds on your backside. Non-Wound Condition o Additional non-wound orders/instructions: - apply Betadine to left 2nd toe cover open areas right lower leg with silver alginate and cover with dressing as needed until stops leaking/ABD pad or kerlix CALL PULMONARY OR PCP FOR ORDERS OF GAINING WEIGHT Additional Orders / Instructions o Follow Nutritious Diet and Increase Protein Intake Wound Treatment Wound #18 - Lower Leg Wound Laterality: Right, Anterior Primary Dressing: Hydrofera Blue Ready Transfer Foam, 2.5x2.5 (in/in) 2 x Per Week/30 Days Discharge Instructions: Apply Hydrofera Blue Ready to wound bed as directed [name], [name] B. ([id number]) Secondary Dressing: tegaderm 2 x Per Week/30 Days Discharge Instructions: applied to secure HB Wound #21 - Elbow Wound Laterality:  Right Cleanser: Normal Saline 2 x Per Day/30 Days Discharge Instructions: Wash your hands with soap and water. Remove old dressing, discard into plastic bag and place into trash. Cleanse the wound with Normal Saline prior to applying a clean dressing using gauze sponges, not tissues or cotton balls. Do not scrub or use excessive force. Pat dry using gauze sponges, not tissue or cotton balls. Primary  Dressing: steri strips applied 2 x Per Day/30 Days Discharge Instructions: 3 placed on skin tear PLEASE DO NOT REMOVE STERI STRIPS, LEAVE IN PLACE Secondary Dressing: ABD Pad 5x9 (in/in) 2 x Per Day/30 Days Discharge Instructions: Cover with ABD pad Secondary Dressing: Kerlix 4.5 x 4.1 (in/yd) 2 x Per Day/30 Days Discharge Instructions: Apply Kerlix 4.5 x 4.1 (in/yd) as instructed Secured With: Stretch Net Size 10 (yds) 2 x Per Day/30 Days Discharge Instructions: To secure dressing in place. Electronic Signature(s) Signed: 05/15/2021 4:04:42 PM By: Levora Dredge Signed: 05/16/2021 4:05:24 PM By: Worthy Keeler PA-C Entered By: Levora Dredge on 05/15/2021 15:53:50 Uhrich, [name] ([id number]) -------------------------------------------------------------------------------- Problem List Details Patient Name: [name] Date of Service: 05/15/2021 2:45 PM Medical Record Number: [id number] Patient Account Number: [id number] Date of Birth/Sex: Jul 15, 1950 (71 y.o. F) Treating RN: Levora Dredge Primary Care Provider: [name], Olivia Mackie Other Clinician: Referring Provider: [name], Olivia Mackie Treating Provider/Extender: Skipper Cliche in Treatment: 89 Active Problems ICD-10 Encounter Code Description Active Date MDM Diagnosis E11.621 Type 2 diabetes mellitus with foot ulcer 06/28/2020 No Yes L97.522 Non-pressure chronic ulcer of other part of left foot with fat layer 06/28/2020 No Yes exposed I87.2 Venous insufficiency (chronic) (peripheral) 06/28/2020 No Yes L97.812  Non-pressure chronic ulcer of other part of right lower leg with fat layer 06/28/2020 No Yes exposed L97.822 Non-pressure chronic ulcer of other part of left lower leg with fat layer 02/11/2021 No Yes exposed Z79.01 Long term (current) use of anticoagulants 06/28/2020 No Yes Inactive Problems ICD-10 Code Description Active Date Inactive Date I10 Essential (primary) hypertension 06/28/2020 06/28/2020 Z86.718 Personal history of other venous thrombosis and embolism 06/28/2020 06/28/2020 Resolved Problems ICD-10 Code Description Active Date Resolved Date M86.572 Other chronic hematogenous osteomyelitis, left ankle and foot 02/11/2021 02/11/2021 L97.322 Non-pressure chronic ulcer of left ankle with fat layer exposed 06/28/2020 06/28/2020 S50.811A Abrasion of right forearm, initial encounter 10/30/2020 10/30/2020 ANNET, MANUKYAN ([id number]) S50.311D Abrasion of right elbow, subsequent encounter 08/16/2020 08/16/2020 Electronic Signature(s) Signed: 05/15/2021 3:00:52 PM By: Worthy Keeler PA-C Entered By: Worthy Keeler on 05/15/2021 15:00:51 [name], [name] ([id number]) -------------------------------------------------------------------------------- Progress Note Details Patient Name: [name] Date of Service: 05/15/2021 2:45 PM Medical Record Number: [id number] Patient Account Number: [id number] Date of Birth/Sex: [birth year] (71 y.o. F) Treating RN: Levora Dredge Primary Care Provider: [name], Olivia Mackie Other Clinician: Referring Provider: [name], Olivia Mackie Treating Provider/Extender: Skipper Cliche in Treatment: 74 Subjective Chief Complaint Information obtained from Patient Bilateral Leg Ulcers and Right 4thToe Ulcer History of Present Illness (HPI) 06/28/20 upon evaluation today patient appears to be doing unfortunately somewhat poorly in regard to wounds that she has over her lower extremities. She has a wound on the left distal second toe, left posterior heel, and right  anterior lower leg. With that being said all in all none of the wounds appear to be too bad the one that hurts her the most is actually the wound on the posterior heel which actually is also one of the smallest. Nonetheless I think there is some callus hiding what is really going on in this area. Fortunately there does not appear to be any obvious evidence of infection which is great news. Patient does have a history of diabetes mellitus type 2, chronic venous insufficiency, hypertension, history of DVTs, and is on long-term anticoagulant therapy, Eliquis. 07/05/20 upon evaluation today patient appears to be doing well currently with regard to her wounds. I feel like she is making progress which is great news and overall  there is no signs of active infection at this time. No fevers, chills, nausea, vomiting, or diarrhea. 4/29; patient has a only a small area remaining on the tip of her left second toe and a small area remaining on the left heel. The area on the right anterior lower leg is healed. They tell me that she had remote podiatric surgery on the toes of the left foot however they are very fixed in position and I wonder whether they are making it difficult to relieve pressure in her foot wear. She is a type II diabetic with neuropathy as well. 08/01/2020 upon evaluation today patient appears to be doing well currently in regard to her wounds. In fact everything appears to be doing much better. The posterior aspect of her heel is actually giving her some trouble not the Achilles area but a small wound that we did not even have marked. In fact I had noted this before. Nonetheless we are going to addressed that today. 5/27; most of the patient's wounds are healed including the left Achilles heel left toe. Right forearm is also healed. She has a new abrasion posteriorly in the right elbow area just above the olecranon this happened today with an abrasion from her car door 6/15; the patient's wounds  on the left Achilles and left second toe remain healed. The area on the right forearm is also just about healed in fact the skin I replaced last time is looking viable which is good. She had me look at the left heel again which is not in the area of the wound more towards the tip of the heel at the Achilles insertion site as well as the tip of the left foot second toe. In the Achilles area that is clearly is friction probably in bed at night. Not really near where the wound was. The left second toe remains healed although this is hammered and overhanging first toe also puts pressure in this area 09/13/2020 upon evaluation today patient appears to be doing well with regard to all previous wounds that she had. Everything is completely healed. With that being said I do think that the patient unfortunately has a new skin tear on the right forearm which is due to her put a Band-Aid on it and then when it pulled off this caused some issues with skin tearing. Nonetheless I think that this needs to be addressed today. Hopefully will take too long to get this to heal. She is having some issues with pain in her heel but I think this is more related to be honest to her neuropathy as opposed to anything else. Readmission: 10/30/2020 upon evaluation today patient appears to be doing poorly in regard to her legs bilaterally. She is also having an issue with her fourth toe on the left. Fortunately there does not appear to be any signs of active infection at this time. No fevers, chills, nausea, vomiting, or diarrhea. It is actually been quite a while since we have seen her. In fact June 24 was the last time that she was here in the clinic. She tells me she did not come back because things were just doing "pretty well". 11/29/2020 upon evaluation today patient actually appears to be doing well with regard to her arms those are completely healed. Her right leg is still open but doing okay. The fourth toe of the left foot  does show some signs here of having some issues with necrotic tissue of an open wound. This is where  a toenail was removed by podiatry. Subsequently I do think that we need to go ahead and see what we can do about getting this cleared away so being try to get some healing going forward. 12/13/2020 upon evaluation today patient appears to be doing decently well in regard to her wound on the right leg. In general I think that she is making good progress here. Unfortunately she continues to have significant issues with swelling in the left leg is now even more swollen at this point. For that reason I do believe she would be benefited by wrapping both sides although she is not interested in doing this. Overall I think that she is definitely making some good progress here nonetheless in regard to the right leg left leg leave some to be desired. 12/27/2020 upon evaluation today patient appears to be doing well with regard to her wound in regard to the legs. Unfortunately the toe was not doing nearly as well. I am much more concerned about infection and osteomyelitis here. She had the MRI but right now were not seeing obvious evidence of redness spreading up to her foot this is good news I do not have the result of the MRI as of yet. I am good to place her on doxycycline however due to the fact that the wound does seem to be getting a little worse in regard to her toe. 01/14/2021 upon evaluation today patient appears to be doing well with regard to her ulcers. The right leg is doing well and even the toe seems to be doing well though she still has some evidence of bone exposed on the toe which does not appear to be doing quite as good as I would like to see just and the fact that is still not covered over new tissue but looks tremendously better compared to 2 weeks ago. Overall very pleased in that regard. 01/28/2021 upon evaluation today patient's leg on the left actually appears to have new wounds this is due  to her deciding to shave yesterday. Fortunately there does not appear to be any signs of infection currently. This is good news. Nonetheless I do believe that with these new areas open it would be best to wrap her leg to try to make sure we get this under control sooner rather than later. With regard to her toe there still does [name], Dawn B. ([id number]) appear to be some evidence of bone exposed this was covered over with callus I did remove that today. Nonetheless I think this is significantly smaller than previous. 02/11/2021 upon evaluation today patient appears to be doing okay in regard to her leg ulcers. I do not see anything obvious at this point this seems to be a complicating factor. I think that she is getting better. Were wrap of the right leg not the left leg. With that being said the left fourth toe ulcer still does have evidence of being open there is some drainage still on top of the wound I think we can to try to see about using something like a collagen dressing today to see if that would be of benefit for her. She is in agreement with giving this a trial. Nonetheless I am concerned about the fact that she continues to have issues here with this not healing it is confirmed chronic osteomyelitis she has been on antibiotics. I am going to extend those today as well. That is Keflex and that will be for 1 additional month. Nonetheless if she is not  doing significantly better by the next time I see her I think that the biggest thing is going to be for [place] to go ahead and see what we can do about getting her started with hyperbaric oxygen therapy to try to save the toe. 02/20/2021 upon evaluation today patient presents for follow-up she actually came in a little bit early as the home health nurse was concerned about her toe becoming "gangrenous". With that being said the good news is there is [place] no evidence of this and in fact the toe seems to be doing better at this point. What was  over the toe was an area that had bled significantly and then subsequently dried making it look much worse than where things actually stand. There is just a very small opening remaining in the toe. In regard to her legs everything appears to be healed bilaterally. 02/24/2021 upon evaluation today patient appears to be doing well with regard to her toe ulcer. I am actually very pleased with where we stand and I think that she is making good progress at this time. There is still a very small opening although I think the biggest issue is the collagen seems to be getting stuck and drying up because the wound is so small we probably just need to use something to keep this moist like a little piece of Xeroform gauze and see if we get this to close. 03/07/2021 upon evaluation patient actually appears to be making excellent progress in regard to her last wound on the toe. There is just a very tiny potential area that I can even be sure at first until I get some of the dry skin off when I did get this removed there was just a very tiny pinpoint opening that was barely even moist. Actually feel like this is very close to complete resolution. She has been using the Xeroform that is doing great. 03/25/2021 upon evaluation today patient appears to be doing well with regard to her toe ulcer. In fact this appears to be potentially completely healed. Fortunately I do not see anything draining at this point which is great news. She does have a small blood blister that is already drying up and looking okay on the third toe as well I am not sure what happened here she has an either. Otherwise she is having some weeping from the right leg this again is newer she tells me from last week weighing in that her pulmonologist and then today weighing in at the hematologist that she gained 30 pounds. This is quite significant if indeed true and I think she is to contact her primary care provider ASAP. This would account for why  her right leg is weeping as well. 04/04/2020 upon evaluation today patient appears to be doing excellent in regard to her toe which I actually think is healed today. With that being said she unfortunately is having issues with her leg swelling and again several blisters on each leg that have reopened unfortunately. 05/02/2021 upon evaluation today patient appears to be doing well with regard to her wounds. In fact is down just 1 on the anterior portion of her right leg which is actually from a blister. Otherwise she is doing awesome. 05/15/2021 upon evaluation today patient appears to be doing better in regard to her leg ulcer although she has a small irritated area from tape on the leg medially. She also has a skin tear to her right elbow region. Fortunately there does not appear to  be any signs of active infection locally nor systemically at this point which is great news. No fevers, chills, nausea, vomiting, or diarrhea. Objective Constitutional Well-nourished and well-hydrated in no acute distress. Vitals Time Taken: 3:04 PM, Height: 65.5 in, Weight: 240 lbs, BMI: 39.3, Temperature: 98.7 F, Pulse: 62 bpm, Respiratory Rate: 18 breaths/min, Blood Pressure: 167/95 mmHg. Respiratory normal breathing without difficulty. Psychiatric this patient is able to make decisions and demonstrates good insight into disease process. Alert and Oriented x 3. pleasant and cooperative. General Notes: Upon inspection patient's wounds appear to be doing quite well. On the elbow region I do believe that I can approximate this and likely get to heal much more effectively and quickly. She is in agreement with this plan. I therefore did use Steri-Strips in order to approximate. I used Skin-Prep initially so these would stay in place. She tolerated that without complication today. In regard to the leg I Georgina Peer continue to recommend the [facility] that is what she has been using at home and it seems to be doing well so  we will stick with that plan. Integumentary (Hair, Skin) Wound #18 status is Open. Original cause of wound was Blister. The date acquired was: 04/01/2021. The wound has been in treatment 5 weeks. The wound is located on the Right,Anterior Lower Leg. The wound measures 0.3cm length x 0.4cm width x 0.1cm depth; 0.094cm^2 area and 0.009cm^3 volume. There is Fat Layer (Subcutaneous Tissue) exposed. There is no tunneling or undermining noted. There is a medium amount Mcclimans, Cleon B. ([id number]) of serosanguineous drainage noted. There is large (67-100%) pink granulation within the wound bed. There is a small (1-33%) amount of necrotic tissue within the wound bed including Adherent Slough. Wound #21 status is Open. Original cause of wound was Skin Tear/Laceration. The date acquired was: 05/15/2021. The wound is located on the Right Elbow. The wound measures 2.4cm length x 2.6cm width x 0.1cm depth; 4.901cm^2 area and 0.49cm^3 volume. There is Fat Layer (Subcutaneous Tissue) exposed. There is no tunneling or undermining noted. There is a medium amount of serosanguineous drainage noted. There is medium (34-66%) red granulation within the wound bed. There is a medium (34-66%) amount of necrotic tissue within the wound bed including Adherent Slough. Assessment Active Problems ICD-10 Type 2 diabetes mellitus with foot ulcer Non-pressure chronic ulcer of other part of left foot with fat layer exposed Venous insufficiency (chronic) (peripheral) Non-pressure chronic ulcer of other part of right lower leg with fat layer exposed Non-pressure chronic ulcer of other part of left lower leg with fat layer exposed Long term (current) use of anticoagulants Plan 1. Would recommend currently that we going continue with the wound care measures as before and the patient is in agreement with the plan. This includes Hydrofera Blue to the leg which is doing well. In regard to the arm we will get a put Steri-Strips on  which I did today and just an ABD pad roll gauze and stretch net to secure in place. 2. I am going to suggest as well that she should strongly consider derma saver protective wraps in order to keep from having new injuries she gets these quite frequently and I am definitely concerned about her. 3. I am also can recommend that we have the patient continue to use her compression socks on the legs which I feel like are doing quite well. We will see patient back for reevaluation in 1 week here in the clinic. If anything worsens or changes patient will  contact our office for additional recommendations. Electronic Signature(s) Signed: 05/15/2021 3:53:15 PM By: Worthy Keeler PA-C Entered By: Worthy Keeler on 05/15/2021 15:53:15 [name], [name] ([id number]) -------------------------------------------------------------------------------- SuperBill Details Patient Name: [name] Date of Service: 05/15/2021 Medical Record Number: [id number] Patient Account Number: [id number] Date of Birth/Sex: 1950/05/13 (71 y.o. F) Treating RN: Levora Dredge Primary Care Provider: [name], Olivia Mackie Other Clinician: Referring Provider: [name], Olivia Mackie Treating Provider/Extender: Skipper Cliche in Treatment: 17 Diagnosis Coding ICD-10 Codes Code Description E11.621 Type 2 diabetes mellitus with foot ulcer L97.522 Non-pressure chronic ulcer of other part of left foot with fat layer exposed I87.2 Venous insufficiency (chronic) (peripheral) L97.812 Non-pressure chronic ulcer of other part of right lower leg with fat layer exposed L97.822 Non-pressure chronic ulcer of other part of left lower leg with fat layer exposed Z79.01 Long term (current) use of anticoagulants Facility Procedures The patient participates with Medicare or their insurance follows the Medicare Facility Guidelines: CPT4 Code Description Modifier Quantity [id number] [place] VISIT-LEV 3 EST PT 1 Physician  Procedures CPT4 Code: [id number] Description: 34193 - WC PHYS LEVEL 4 - EST PT Modifier: Quantity: 1 CPT4 Code: Description: ICD-10 Diagnosis Description E11.621 Type 2 diabetes mellitus with foot ulcer L97.522 Non-pressure chronic ulcer of other part of left foot with fat layer ex I87.2 Venous insufficiency (chronic) (peripheral) L97.812 Non-pressure chronic  ulcer of other part of right lower leg with fat la Modifier: posed yer exposed Quantity: Electronic Signature(s) Signed: 05/15/2021 4:03:18 PM By: Levora Dredge Signed: 05/16/2021 4:05:24 PM By: Worthy Keeler PA-C Previous Signature: 05/15/2021 3:56:41 PM Version By: Worthy Keeler PA-C Entered By: Levora Dredge on 05/15/2021 16:03:18

## 2021-05-15 NOTE — Progress Notes (Incomplete)
Cardiology Office Note:    Date:  05/15/2021   ID:  [name], DOB 1950/09/29, MRN [id number]  PCP:  [name], [name], MD  [facility] Cardiologist:  Kathlyn [place], MD  [facility] Electrophysiologist:  None   Referring MD: [name], Olivia Mackie *   Chief Complaint: 4-6 week f/u  History of Present Illness:    [name] is a 71 y.o. adult with a hx of with a hx of paroxysmal atrial fibrillation, prior strokes, hypertension, hyperlipidemia, diabetes, obesity, chronic pain, DVT, sleep apnea on CPAP, and obesity, presents for follow-up.   She has a history of chronic left foot ulceration followed by vascular surgery.  Angiogram in 2020 showed no evidence of PAD and she subsequently underwent debridement with improvement.  In the setting of atypical chest pain, she underwent stress testing in January 2020, which was nonischemic.  In June 2021, she was admitted with pneumonia, sepsis, and mild troponin elevation to a peak of 170.  Echocardiogram showed an EF of 60 to 65% without regional wall motion abnormalities.  It was felt that troponin elevation was secondary to demand ischemia:.  She was admitted in late September 2021 with altered mental status, and left upper extremity weakness.  MRI of the brain showed bilateral small multiple acute strokes suggestive of embolic etiology.  She was on Pradaxa at that time and this was transitioned to Eliquis.  Unfortunately, she suffered another stroke in November 2021.  Neurology suspected recurrent strokes are due to severe malnutrition given gastric sleeve and noncompliance with vitamins.  In January 2022, she was admitted again for altered mental status and concern for stroke, though this was ruled out.  Echo with bubble study showed normal LV function and no evidence of ASD or PFO.  Seen 02/06/21 and volume was better on lasix 20mg  daily. BB was decreased for HR in the 40s. Follow-up labs were stable.  Last seen 04/11/21 and  reported improved lower leg swelling.   Today  Past Medical History:  Diagnosis Date   Abnormal antibody titer    Anxiety and depression    Arthritis    Asthma    Atypical chest pain    a. 03/2018 MV: No ischemia/infarct. EF >65%.   Carotid arterial disease ([place])    a. 07/5730 U/S: RICA <20, LICA nl.   Cystocele    Depression    Diabetes ([place])    DVT (deep venous thrombosis) ([place])    Righ calf   Dyspnea    11/08/2018 - "more now due to lack of exercise"   Edema    GERD (gastroesophageal reflux disease)    Heart murmur    a. 08/2013 Echo: EF 60-65%, Mild MR (poss veg seen on TTE, not seen on TEE), nl LV size with mod conc LVH. Mild LAE; b. 08/2019 Echo: EF 60-65%, no rwma. Nl RV size/fxn. No significant valvular dzs.   History of IBS    History of kidney stones    History of methicillin resistant staphylococcus aureus (MRSA) 2008   History of multiple strokes    Hyperlipidemia    Hypertension    Hypothyroidism    Insomnia    Kidney stone    kidney stones with lithotripsy .  [facility]- "adrenal glands"   Neuropathy involving both lower extremities    Non-healing ulcer of left foot (HCC)    Obesity, Class II, BMI 35-39.9, with comorbidity    PAF (paroxysmal atrial fibrillation) ([place])    a. 07/2013 Event monitor: Freq  PVCs and 3 short runs of Afib-->CHA2DS2VASc = 7-->Eliquis.   Peripheral vascular disease ([place])    a. 03/2018 LE Angio: Nl renal arteries. Nl aorta & iliacs. Nl LLE arterial flow; b. 06/2020 ABI's:   Sleep apnea    use C-PAP   Stroke ([place])    03/2020   Urinary incontinence    Venous stasis dermatitis of both lower extremities     Past Surgical History:  Procedure Laterality Date   ANKLE SURGERY Right    APPLICATION OF WOUND VAC Left 06/27/2015   Procedure: APPLICATION OF WOUND VAC ( POSSIBLE ) ;  Surgeon: [name], MD;  Location: [facility] ORS;  Service: Vascular;  Laterality: Left;   APPLICATION OF WOUND VAC Left 09/25/2016   Procedure: APPLICATION OF WOUND  VAC;  Surgeon: [name], DPM;  Location: [facility]S;  Service: Podiatry;  Laterality: Left;   arm surgery     right     CHOLECYSTECTOMY     COLONOSCOPY     COLONOSCOPY WITH PROPOFOL N/A 01/11/2017   Procedure: COLONOSCOPY WITH PROPOFOL;  Surgeon: [name], MD;  Location: [facility] ENDOSCOPY;  Service: Endoscopy;  Laterality: N/A;   COLONOSCOPY WITH PROPOFOL N/A 02/22/2017   Procedure: COLONOSCOPY WITH PROPOFOL;  Surgeon: [name], MD;  Location[facility] END[facility];  Service: Endoscopy;  Laterality: N/A;   COLONOSCOPY WITH PROPOFOL N/A 05/31/2017   Procedure: COLONOSCOPY WITH PROPOFOL;  Surgeon: [name], MD;  Location: [facility] ENDOSCOPY;  Service: Endoscopy;  Laterality: N/A;   ESOPHAGOGASTRODUODENOSCOPY (EGD) WITH PROPOFOL N/A 01/11/2017   Procedure: ESOPHAGOGASTRODUODENOSCOPY (EGD) WITH PROPOFOL;  Surgeon: [name], MD;  Location: [facility] ENDOSCOPY;  Service: Endoscopy;  Laterality: N/A;   ESOPHAGOGASTRODUODENOSCOPY (EGD) WITH PROPOFOL N/A 10/25/2018   Procedure: ESOPHAGOGASTRODUODENOSCOPY (EGD) WITH PROPOFOL;  Surgeon: [name], MD;  Location: [facility] ENDOSCOPY;  Service: Endoscopy;  Laterality: N/A;   ESOPHAGOGASTRODUODENOSCOPY (EGD) WITH PROPOFOL N/A 11/29/2019   Procedure: ESOPHAGOGASTRODUODENOSCOPY (EGD) WITH PROPOFOL;  Surgeon: Toledo, Benay Pike, MD;  Location: [facility] ENDOSCOPY;  Service: Gastroenterology;  Laterality: N/A;   HERNIA REPAIR     umbilical   HIATAL HERNIA REPAIR     I & D EXTREMITY Left 06/27/2015   Procedure: IRRIGATION AND DEBRIDEMENT EXTREMITY            ( CALF HEMATOMA ) POSSIBLE WOUND VAC;  Surgeon: [name], MD;  Location: [facility] ORS;  Service: Vascular;  Laterality: Left;   INCISION AND DRAINAGE OF WOUND Left 09/25/2016   Procedure: IRRIGATION AND DEBRIDEMENT - PARTIAL RESECTION OF ACHILLES TENDON WITH WOUND VAC APPLICATION;  Surgeon: [name], DPM;  Location: [facility] ORS;  Service: Podiatry;  Laterality: Left;   INCISION AND  DRAINAGE OF WOUND Left 11/09/2018   Procedure: Excision of left foot wound with ACell placement;  Surgeon: Wallace Going, DO;  Location: [place];  Service: Plastics;  Laterality: Left;   IRRIGATION AND DEBRIDEMENT ABSCESS Left 09/07/2016   Procedure: IRRIGATION AND DEBRIDEMENT ABSCESS LEFT HEEL;  Surgeon: [name], DPM;  Location: [facility];  Service: Podiatry;  Laterality: Left;   JOINT REPLACEMENT  2013   left knee replacement   KIDNEY SURGERY Right    kidney stones   LITHOTRIPSY     LOWER EXTREMITY ANGIOGRAPHY Left 04/04/2018   Procedure: LOWER EXTREMITY ANGIOGRAPHY;  Surgeon: [name], MD;  Location: [facility];  Service: Cardiovascular;  Laterality: Left;   NM GATED MYOVIEW [facility] HX)  February 2017   Likely breast attenuation. LOW RISK study.  Normal EF 55-60%.   OTHER SURGICAL HISTORY     08/2016 or 09/2016 surgery on achilles tenso h/o staph infection heal. Dr. [name]   removal of left hematoma Left    leg   REVERSE SHOULDER ARTHROPLASTY Right 10/08/2015   Procedure: REVERSE SHOULDER ARTHROPLASTY;  Surgeon: [name], MD;  Location: [facility] ORS;  Service: Orthopedics;  Laterality: Right;   SLEEVE GASTROPLASTY     TONSILLECTOMY     TOTAL KNEE ARTHROPLASTY Left    TOTAL SHOULDER REPLACEMENT Right    TRANSESOPHAGEAL ECHOCARDIOGRAM  08/08/2013   Mild LVH, EF 60-65%. Moderate LA dilation and mild RA dilation. Mild MR with no evidence of stenosis and no evidence of endocarditis. A false chordae is noted.   TRANSTHORACIC ECHOCARDIOGRAM  08/03/2013   Mild-Moderate concentric LVH, EF 60-65%. Normal diastolic function. Mild LA dilation. Mild MR with possible vegetation  - not confirmed on TEE    ULNAR NERVE TRANSPOSITION Right 08/06/2016   Procedure: ULNAR NERVE DECOMPRESSION/TRANSPOSITION;  Surgeon: [name], MD;  Location: [facility] ORS;  Service: Orthopedics;  Laterality: Right;   UPPER GI ENDOSCOPY     VAGINAL HYSTERECTOMY      Current Medications: No outpatient  medications have been marked as taking for the 05/16/21 encounter (Appointment) with Kathlen Mody, Emaleigh Guimond H, PA-C.     Allergies:   Morphine and related, Penicillins, Ambien [zolpidem], Aspirin, and Oxytetracycline   Social History   Socioeconomic History   Marital status: Married    Spouse name: Not on file   Number of children: Not on file   Years of education: Not on file   Highest education level: Not on file  Occupational History   Not on file  Tobacco Use   Smoking status: Never   Smokeless tobacco: Never  Vaping Use   Vaping Use: Never used  Substance and Sexual Activity   Alcohol use: No    Alcohol/week: 0.0 standard drinks   Drug use: Never   Sexual activity: Not Currently  Other Topics Concern   Not on file  Social History Narrative   She is currently married -- for 30+ years. Does not work. Does not smoke or take alcohol. She never smoked. She exercises at least 3 days a week since before her gastric surgery.   Marital status reviewed in history of present illness.   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    Social Determinants of Radio broadcast assistant Strain: Not on file  Food Insecurity: Not on file  Transportation Needs: Not on file  Physical Activity: Not on file  Stress: Not on file  Social Connections: Not on file     Family History: The patient's family history includes Alcohol abuse in her father; Arthritis in her maternal grandfather, maternal grandmother, mother, paternal grandfather, and paternal grandmother; Breast cancer in her maternal aunt; Cancer in her father and maternal aunt; Cerebral aneurysm in her father; Diabetes in her father, maternal grandmother, and paternal grandmother; Heart disease in her maternal grandfather; Hypertension in her father, maternal grandfather, maternal grandmother, paternal grandfather, and paternal grandmother; Kidney disease in her mother; Mental illness in her  sister; Peripheral vascular disease in her father; Skin cancer in her father; Varicose Veins in her mother. There is no history of Bladder Cancer or Kidney cancer.  ROS:   Please see the history of present illness.     All other systems reviewed and are negative.  EKGs/Labs/Other Studies Reviewed:    The following studies were reviewed today:    Echo 03/2020 1. Left ventricular ejection fraction, by estimation, is 60 to 65%. The  left ventricle has normal function. The left ventricle has no regional  wall motion abnormalities. Left ventricular diastolic function could not  be evaluated.   2. Right ventricular systolic function is normal. The right ventricular  size is not well visualized.   3. The mitral valve is degenerative. Mild mitral valve regurgitation.   4. Tricuspid valve regurgitation is mild to moderate.   5. The aortic valve is tricuspid. Aortic valve regurgitation is not  visualized.   6. Agitated saline contrast bubble study was negative, with no evidence  of any interatrial shunt.    Myoview lexiscan 03/2018 Narrative & Impression  There was no ST segment deviation noted during stress. No T wave inversion was noted during stress. The study is normal. This is a low risk study. The left ventricular ejection fraction is hyperdynamic (>65%). Sinus bradycardia at baseline (47 bpm)      EKG:  EKG is *** ordered today.  The ekg ordered today demonstrates ***  Recent Labs: 03/25/2021: ALT 9; BUN 23; Creatinine, Ser 1.19; Hemoglobin 8.6; Platelets 239; Potassium 4.7; Sodium 137; TSH 0.142 04/11/2021: BNP 225.6  Recent Lipid Panel    Component Value Date/Time   CHOL 65 04/07/2020 0500   CHOL 185 10/10/2014 0822   TRIG 51 04/07/2020 0500   HDL 46 04/07/2020 0500   HDL 94 10/10/2014 0822   CHOLHDL 1.4 04/07/2020 0500   VLDL 10 04/07/2020 0500   LDLCALC 9 04/07/2020 0500   LDLCALC 73 10/10/2014 0822     Risk Assessment/Calculations:   {Does this patient have  ATRIAL FIBRILLATION?:[id number]}   Physical Exam:    VS:  There were no vitals taken for this visit.    Wt Readings from Last 3 Encounters:  05/12/21 261 lb (118.4 kg)  04/29/21 261 lb 3.2 oz (118.5 kg)  04/11/21 269 lb (122 kg)     GEN: *** Well nourished, well developed in no acute distress HEENT: Normal NECK: No JVD; No carotid bruits LYMPHATICS: No lymphadenopathy CARDIAC: ***RRR, no murmurs, rubs, gallops RESPIRATORY:  Clear to auscultation without rales, wheezing or rhonchi  ABDOMEN: Soft, non-tender, non-distended MUSCULOSKELETAL:  No edema; No deformity  SKIN: Warm and dry NEUROLOGIC:  Alert and oriented x 3 PSYCHIATRIC:  Normal affect   ASSESSMENT:    No diagnosis found. PLAN:    In order of problems listed above:  HFpEF  PAF  HTN  H/o stroke  Disposition: Follow up {follow up:15908} with ***   Shared Decision Making/Informed Consent   {Are you ordering a CV Procedure (e.g. stress test, cath, DCCV, TEE, etc)?   Press F2        :[id number]}    Signed, Claramae Rigdon Arlyss Repress  05/15/2021 10:30 PM    [facility]

## 2021-05-16 ENCOUNTER — Ambulatory Visit: Payer: Medicare HMO | Admitting: Medical

## 2021-05-19 ENCOUNTER — Telehealth: Payer: Self-pay

## 2021-05-19 ENCOUNTER — Other Ambulatory Visit: Payer: Self-pay

## 2021-05-19 DIAGNOSIS — J449 Chronic obstructive pulmonary disease, unspecified: Secondary | ICD-10-CM | POA: Diagnosis not present

## 2021-05-19 DIAGNOSIS — M86572 Other chronic hematogenous osteomyelitis, left ankle and foot: Secondary | ICD-10-CM | POA: Diagnosis not present

## 2021-05-19 DIAGNOSIS — I872 Venous insufficiency (chronic) (peripheral): Secondary | ICD-10-CM | POA: Diagnosis not present

## 2021-05-19 DIAGNOSIS — R82998 Other abnormal findings in urine: Secondary | ICD-10-CM

## 2021-05-19 DIAGNOSIS — E11621 Type 2 diabetes mellitus with foot ulcer: Secondary | ICD-10-CM | POA: Diagnosis not present

## 2021-05-19 DIAGNOSIS — L97522 Non-pressure chronic ulcer of other part of left foot with fat layer exposed: Secondary | ICD-10-CM | POA: Diagnosis not present

## 2021-05-19 DIAGNOSIS — E1159 Type 2 diabetes mellitus with other circulatory complications: Secondary | ICD-10-CM | POA: Diagnosis not present

## 2021-05-19 DIAGNOSIS — I152 Hypertension secondary to endocrine disorders: Secondary | ICD-10-CM | POA: Diagnosis not present

## 2021-05-19 DIAGNOSIS — E1142 Type 2 diabetes mellitus with diabetic polyneuropathy: Secondary | ICD-10-CM | POA: Diagnosis not present

## 2021-05-19 DIAGNOSIS — E1152 Type 2 diabetes mellitus with diabetic peripheral angiopathy with gangrene: Secondary | ICD-10-CM | POA: Diagnosis not present

## 2021-05-19 DIAGNOSIS — L97812 Non-pressure chronic ulcer of other part of right lower leg with fat layer exposed: Secondary | ICD-10-CM | POA: Diagnosis not present

## 2021-05-19 MED ORDER — NITROFURANTOIN MONOHYD MACRO 100 MG PO CAPS: 100.0000 mg | ORAL_CAPSULE | Freq: Two times a day (BID) | ORAL | 0 refills | Status: AC

## 2021-05-19 NOTE — Progress Notes (Signed)
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 Pt said she still has UTI sx and completed her last dose of antibiotics yesterday. [name] was told if thept continues to have sx she need to schedule an appointment to be evaluated by Dr.Schroeder.   [name] Ph # [phone]

## 2021-05-19 NOTE — Telephone Encounter (Signed)
Called and LVM in regards to upcoming covid test. Will try again later. Routing to triage.

## 2021-05-20 NOTE — Telephone Encounter (Signed)
Patient is aware of date/time of covid test and voiced her understanding.  Nothing further needed.

## 2021-05-20 NOTE — Telephone Encounter (Signed)
Lm x2 for patient.  Will close encounter per office protocol.   

## 2021-05-21 ENCOUNTER — Ambulatory Visit: Payer: Medicare HMO | Admitting: Primary Care

## 2021-05-21 ENCOUNTER — Telehealth: Payer: Self-pay | Admitting: Pulmonary Disease

## 2021-05-21 NOTE — Telephone Encounter (Signed)
I have spoke with Pamala Hurry with scheduling and she CXL the Covid and PFT appt. We don't have April schedule yet  ?

## 2021-05-22 ENCOUNTER — Inpatient Hospital Stay: Payer: Medicare HMO | Attending: Oncology

## 2021-05-22 ENCOUNTER — Other Ambulatory Visit: Payer: Self-pay

## 2021-05-22 ENCOUNTER — Other Ambulatory Visit: Payer: Medicare HMO

## 2021-05-22 VITALS — BP 142/55 | Resp 18

## 2021-05-22 DIAGNOSIS — D509 Iron deficiency anemia, unspecified: Secondary | ICD-10-CM | POA: Insufficient documentation

## 2021-05-22 DIAGNOSIS — Z79899 Other long term (current) drug therapy: Secondary | ICD-10-CM | POA: Diagnosis not present

## 2021-05-22 DIAGNOSIS — D508 Other iron deficiency anemias: Secondary | ICD-10-CM

## 2021-05-22 MED ORDER — SODIUM CHLORIDE 0.9 % IV SOLN: 200.0000 mg | INTRAVENOUS | Status: DC

## 2021-05-22 MED ORDER — SODIUM CHLORIDE 0.9 % IV SOLN
Freq: Once | INTRAVENOUS | Status: AC
  Filled 2021-05-22: qty 250

## 2021-05-22 MED ORDER — IRON SUCROSE 20 MG/ML IV SOLN
200.0000 mg | Freq: Once | INTRAVENOUS | Status: AC
  Administered 2021-05-22: 200 mg via INTRAVENOUS
  Filled 2021-05-22: qty 10

## 2021-05-23 ENCOUNTER — Ambulatory Visit: Payer: Medicare HMO

## 2021-05-26 ENCOUNTER — Other Ambulatory Visit: Payer: Self-pay

## 2021-05-26 ENCOUNTER — Encounter: Payer: Medicare HMO | Attending: Physician Assistant | Admitting: Physician Assistant

## 2021-05-26 DIAGNOSIS — Z6841 Body Mass Index (BMI) 40.0 and over, adult: Secondary | ICD-10-CM | POA: Diagnosis not present

## 2021-05-26 DIAGNOSIS — I10 Essential (primary) hypertension: Secondary | ICD-10-CM | POA: Insufficient documentation

## 2021-05-26 DIAGNOSIS — Z86718 Personal history of other venous thrombosis and embolism: Secondary | ICD-10-CM | POA: Diagnosis not present

## 2021-05-26 DIAGNOSIS — E11622 Type 2 diabetes mellitus with other skin ulcer: Secondary | ICD-10-CM | POA: Diagnosis not present

## 2021-05-26 DIAGNOSIS — L97812 Non-pressure chronic ulcer of other part of right lower leg with fat layer exposed: Secondary | ICD-10-CM | POA: Diagnosis not present

## 2021-05-26 DIAGNOSIS — L97822 Non-pressure chronic ulcer of other part of left lower leg with fat layer exposed: Secondary | ICD-10-CM | POA: Insufficient documentation

## 2021-05-26 DIAGNOSIS — I872 Venous insufficiency (chronic) (peripheral): Secondary | ICD-10-CM | POA: Diagnosis not present

## 2021-05-26 DIAGNOSIS — E11621 Type 2 diabetes mellitus with foot ulcer: Secondary | ICD-10-CM | POA: Insufficient documentation

## 2021-05-26 DIAGNOSIS — E1151 Type 2 diabetes mellitus with diabetic peripheral angiopathy without gangrene: Secondary | ICD-10-CM | POA: Diagnosis not present

## 2021-05-26 DIAGNOSIS — L98492 Non-pressure chronic ulcer of skin of other sites with fat layer exposed: Secondary | ICD-10-CM | POA: Diagnosis not present

## 2021-05-26 DIAGNOSIS — E1140 Type 2 diabetes mellitus with diabetic neuropathy, unspecified: Secondary | ICD-10-CM | POA: Insufficient documentation

## 2021-05-26 DIAGNOSIS — I69319 Unspecified symptoms and signs involving cognitive functions following cerebral infarction: Secondary | ICD-10-CM | POA: Diagnosis not present

## 2021-05-26 DIAGNOSIS — I69354 Hemiplegia and hemiparesis following cerebral infarction affecting left non-dominant side: Secondary | ICD-10-CM | POA: Diagnosis not present

## 2021-05-26 DIAGNOSIS — Z7901 Long term (current) use of anticoagulants: Secondary | ICD-10-CM | POA: Diagnosis not present

## 2021-05-26 DIAGNOSIS — G4733 Obstructive sleep apnea (adult) (pediatric): Secondary | ICD-10-CM | POA: Diagnosis not present

## 2021-05-26 DIAGNOSIS — I480 Paroxysmal atrial fibrillation: Secondary | ICD-10-CM | POA: Diagnosis not present

## 2021-05-26 DIAGNOSIS — Z9989 Dependence on other enabling machines and devices: Secondary | ICD-10-CM | POA: Diagnosis not present

## 2021-05-26 DIAGNOSIS — L97522 Non-pressure chronic ulcer of other part of left foot with fat layer exposed: Secondary | ICD-10-CM | POA: Insufficient documentation

## 2021-05-26 DIAGNOSIS — R69 Illness, unspecified: Secondary | ICD-10-CM | POA: Diagnosis not present

## 2021-05-26 NOTE — Progress Notes (Addendum)
[name] ([id number]) Visit Report for 05/26/2021 Arrival Information Details Patient Name: [name], [name] Date of Service: 05/26/2021 3:15 PM Medical Record Number: [id number] Patient Account Number: [id number] Date of Birth/Sex: May 09, 1950 (71 y.o. F) Treating RN: Donnamarie Poag Primary Care Casia Corti: [name], Olivia Mackie Other Clinician: Referring Estill Llerena: [name], Olivia Mackie Treating Darvell Monteforte/Extender: Skipper Cliche in Treatment: 38 Visit Information History Since Last Visit Added or deleted any medications: No Patient Arrived: Wheel Chair Had a fall or experienced change in No Arrival Time: 15:16 activities of daily living that may affect Accompanied By: husband risk of falls: Transfer Assistance: EasyPivot Patient Lift Hospitalized since last visit: No Patient Identification Verified: Yes Has Dressing in Place as Prescribed: Yes Secondary Verification Process Completed: Yes Pain Present Now: No Patient Requires Transmission-Based No Precautions: Patient Has Alerts: Yes Patient Alerts: Patient on Blood Thinner Eliquis Diabetic II 06/2020 ABI L)1.22 R) 1.14 Electronic Signature(s) Signed: 05/26/2021 4:05:35 PM By: Donnamarie Poag Entered By: Donnamarie Poag on 05/26/2021 15:19:48 Muntean, [name] ([id number]) -------------------------------------------------------------------------------- Clinic Level of Care Assessment Details Patient Name: [name] Date of Service: 05/26/2021 3:15 PM Medical Record Number: [id number] Patient Account Number: [id number] Date of Birth/Sex: 1951-01-31 (70 y.o. F) Treating RN: Donnamarie Poag Primary Care Haydon Dorris: [name], Olivia Mackie Other Clinician: Referring Analiah Drum: [name], Olivia Mackie Treating Teliah Buffalo/Extender: Skipper Cliche in Treatment: 16 Clinic Level of Care Assessment Items TOOL 4 Quantity Score '[]'$  - Use when only an EandM is performed on FOLLOW-UP visit 0 ASSESSMENTS - Nursing Assessment / Reassessment '[]'$   - Reassessment of Co-morbidities (includes updates in patient status) 0 '[]'$  - 0 Reassessment of Adherence to Treatment Plan ASSESSMENTS - Wound and Skin Assessment / Reassessment '[]'$  - Simple Wound Assessment / Reassessment - one wound 0 X- 2 5 Complex Wound Assessment / Reassessment - multiple wounds '[]'$  - 0 Dermatologic / Skin Assessment (not related to wound area) ASSESSMENTS - Focused Assessment X - Circumferential Edema Measurements - multi extremities 1 5 '[]'$  - 0 Nutritional Assessment / Counseling / Intervention '[]'$  - 0 Lower Extremity Assessment (monofilament, tuning fork, pulses) '[]'$  - 0 Peripheral Arterial Disease Assessment (using hand held doppler) ASSESSMENTS - Ostomy and/or Continence Assessment and Care '[]'$  - Incontinence Assessment and Management 0 '[]'$  - 0 Ostomy Care Assessment and Management (repouching, etc.) PROCESS - Coordination of Care X - Simple Patient / Family Education for ongoing care 1 15 '[]'$  - 0 Complex (extensive) Patient / Family Education for ongoing care '[]'$  - 0 Staff obtains Programmer, systems, Records, Test Results / Process Orders X- 1 10 Staff telephones HHA, Nursing Homes / Clarify orders / etc '[]'$  - 0 Routine Transfer to another Facility (non-emergent condition) '[]'$  - 0 Routine Hospital Admission (non-emergent condition) '[]'$  - 0 New Admissions / Biomedical engineer / Ordering NPWT, Apligraf, etc. '[]'$  - 0 Emergency Hospital Admission (emergent condition) X- 1 10 Simple Discharge Coordination '[]'$  - 0 Complex (extensive) Discharge Coordination PROCESS - Special Needs '[]'$  - Pediatric / Minor Patient Management 0 '[]'$  - 0 Isolation Patient Management '[]'$  - 0 Hearing / Language / Visual special needs '[]'$  - 0 Assessment of Community assistance (transportation, D/C planning, etc.) '[]'$  - 0 Additional assistance / Altered mentation '[]'$  - 0 Support Surface(s) Assessment (bed, cushion, seat, etc.) INTERVENTIONS - Wound Cleansing / Measurement Adelson, Keayra B.  ([id number]) '[]'$  - 0 Simple Wound Cleansing - one wound X- 2 5 Complex Wound Cleansing - multiple wounds X- 1 5 Wound Imaging (photographs - any number of wounds) '[]'$  - 0 Wound Tracing (instead of  photographs) '[]'$  - 0 Simple Wound Measurement - one wound X- 2 5 Complex Wound Measurement - multiple wounds INTERVENTIONS - Wound Dressings X - Small Wound Dressing one or multiple wounds 1 10 '[]'$  - 0 Medium Wound Dressing one or multiple wounds '[]'$  - 0 Large Wound Dressing one or multiple wounds '[]'$  - 0 Application of Medications - topical '[]'$  - 0 Application of Medications - injection INTERVENTIONS - Miscellaneous '[]'$  - External ear exam 0 '[]'$  - 0 Specimen Collection (cultures, biopsies, blood, body fluids, etc.) '[]'$  - 0 Specimen(s) / Culture(s) sent or taken to Lab for analysis '[]'$  - 0 Patient Transfer (multiple staff / Civil Service fast streamer / Similar devices) '[]'$  - 0 Simple Staple / Suture removal (25 or less) '[]'$  - 0 Complex Staple / Suture removal (26 or more) '[]'$  - 0 Hypo / Hyperglycemic Management (close monitor of Blood Glucose) '[]'$  - 0 Ankle / Brachial Index (ABI) - do not check if billed separately X- 1 5 Vital Signs Has the patient been seen at the hospital within the last three years: Yes Total Score: 90 Level Of Care: New/Established - Level 3 Electronic Signature(s) Signed: 05/26/2021 4:05:35 PM By: Donnamarie Poag Entered By: Donnamarie Poag on 05/26/2021 15:44:23 Duplechain, [name] ([id number]) -------------------------------------------------------------------------------- Encounter Discharge Information Details Patient Name: [name] Date of Service: 05/26/2021 3:15 PM Medical Record Number: [id number] Patient Account Number: [id number] Date of Birth/Sex: [birth year] (70 y.o. F) Treating RN: Donnamarie Poag Primary Care Erick Murin: [name], Olivia Mackie Other Clinician: Referring Jahzeel Poythress: [name], Olivia Mackie Treating Jeena Arnett/Extender: Skipper Cliche in Treatment: 72 Encounter  Discharge Information Items Discharge Condition: Stable Ambulatory Status: Wheelchair Discharge Destination: Home Transportation: Private Auto Accompanied By: husband Schedule Follow-up Appointment: Yes Clinical Summary of Care: Electronic Signature(s) Signed: 05/26/2021 4:05:35 PM By: Donnamarie Poag Entered By: Donnamarie Poag on 05/26/2021 15:47:40 Boodram, [name] ([id number]) -------------------------------------------------------------------------------- Lower Extremity Assessment Details Patient Name: [name] Date of Service: 05/26/2021 3:15 PM Medical Record Number: [id number] Patient Account Number: [id number] Date of Birth/Sex: 28-May-1950 (70 y.o. F) Treating RN: Donnamarie Poag Primary Care Bryli Mantey: [name], Olivia Mackie Other Clinician: Referring Weslie Rasmus: [name], Olivia Mackie Treating Jyron Turman/Extender: Skipper Cliche in Treatment: 47 Edema Assessment Assessed: [Left: No] [Right: Yes] Edema: [Left: Ye] [Right: s] Calf Left: Right: Point of Measurement: 32 cm From Medial Instep 36 cm Ankle Left: Right: Point of Measurement: 9 cm From Medial Instep 19 cm Electronic Signature(s) Signed: 05/26/2021 4:05:35 PM By: Donnamarie Poag Entered By: Donnamarie Poag on 05/26/2021 15:27:48 Joos, [name] ([id number]) -------------------------------------------------------------------------------- Multi Wound Chart Details Patient Name: [name] Date of Service: 05/26/2021 3:15 PM Medical Record Number: [id number] Patient Account Number: [id number] Date of Birth/Sex: 1950/10/27 (70 y.o. 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 N/A Extremity Comorbid History: Asthma, Pneumothorax, Asthma, Pneumothorax, N/A Arrhythmia, Hypertension, Type II Arrhythmia, Hypertension, Type II Diabetes, Gout, Osteoarthritis, Diabetes, Gout, Osteoarthritis, Neuropathy Neuropathy Date Acquired: 04/01/2021 05/15/2021 N/A Weeks of Treatment: 7 1 N/A Wound Status: Open Open N/A Wound Recurrence: No No N/A Measurements L x W x D (cm) 0.3x0.2x0.1 1.2x0.2x0.1 N/A Area (cm) : 0.047 0.188 N/A Volume (cm) : 0.005 0.019 N/A % Reduction in  Area: 76.00% 96.20% N/A % Reduction in Volume: 94.90% 96.10% N/A Classification: Grade 2 Full Thickness Without Exposed N/A Support Structures Exudate Amount: Medium Medium N/A Exudate Type: Serosanguineous Serosanguineous N/A Exudate Color: red, brown red, brown N/A Granulation Amount: Large (67-100%) None Present (0%) N/A Granulation Quality: Pink N/A N/A Necrotic Amount: Small (1-33%) Large (67-100%) N/A Exposed Structures: Fat Layer (Subcutaneous Tissue): Fat Layer (Subcutaneous Tissue): N/A Yes Yes Fascia: No Tendon: No Muscle: No Joint: No Bone: No Epithelialization: None Small (1-33%) N/A Treatment Notes Electronic Signature(s) Signed: 05/26/2021 4:05:35 PM By: Donnamarie Poag Entered By: Donnamarie Poag on 05/26/2021 15:28:20 PRIYAL, MUSQUIZ ([id number]) BLACKSTON, [name] ([id number]) -------------------------------------------------------------------------------- [place] Details Patient Name: [name] Date of Service: 05/26/2021 3:15 PM Medical Record Number: [id number] Patient Account Number: [id number] Date of Birth/Sex: 06/03/1950 (70 y.o. F) Treating RN: Donnamarie Poag Primary Care Jeannetta Cerutti: [name], Olivia Mackie Other Clinician: Referring Tenea Sens: [name], Olivia Mackie Treating  Leiann Sporer/Extender: Skipper Cliche in Treatment: 60 Active Inactive Electronic Signature(s) Signed: 05/26/2021 4:05:35 PM By: Donnamarie Poag Entered By: Donnamarie Poag on 05/26/2021 15:28:06 [name] ([id number]) -------------------------------------------------------------------------------- Pain Assessment Details Patient Name: [name] Date of Service: 05/26/2021 3:15 PM Medical Record Number: [id number] Patient Account Number: [id number] Date of Birth/Sex: May 19, 1950 (70 y.o. F) Treating RN: Donnamarie Poag Primary Care Equan Cogbill: [name], Olivia Mackie Other Clinician: Referring Raihana Balderrama: [name], Olivia Mackie Treating Alphonsine Minium/Extender: Skipper Cliche in Treatment: 40 Active Problems Location of Pain Severity and Description of Pain Patient Has Paino No Site Locations Rate the pain. Current Pain Level: 0 Pain Management and Medication Current Pain Management: Notes stated left shoulder pain with chronic condition Electronic Signature(s) Signed: 05/26/2021 4:05:35 PM By: Donnamarie Poag Entered By: Donnamarie Poag on 05/26/2021 15:20:58 [name] ([id number]) -------------------------------------------------------------------------------- Patient/Caregiver Education Details Patient Name: [name] Date of Service: 05/26/2021 3:15 PM Medical Record Number: [id number] Patient Account Number: [id number] Date of Birth/Gender: 01-29-1951 (70 y.o. F) Treating RN: Donnamarie Poag Primary Care Physician: [name], Olivia Mackie Other Clinician: Referring Physician: [name], Olivia Mackie Treating Physician/Extender: Skipper Cliche in Treatment: 49 Education Assessment Education Provided To: Patient and Caregiver Education Topics Provided Basic Hygiene: Venous: Wound/Skin Impairment: Engineer, maintenance) Signed: 05/26/2021 4:05:35 PM By: Donnamarie Poag Entered By: Donnamarie Poag on 05/26/2021 15:44:47 Mcconahy, [name]  ([id number]) -------------------------------------------------------------------------------- Wound Assessment Details Patient Name: [name] Date of Service: 05/26/2021 3:15 PM Medical Record Number: [id number] Patient Account Number: [id number] Date of Birth/Sex: [birth year] (70 y.o. F) Treating RN: Donnamarie Poag Primary Care Tailyn Hantz: [name], Olivia Mackie Other Clinician: Referring Dexter Signor: [name], Olivia Mackie Treating Kennth Vanbenschoten/Extender: Skipper Cliche in Treatment: 47 Wound Status Wound Number: 18 Primary Diabetic Wound/Ulcer of the Lower Extremity Etiology: Wound Location: Right, Anterior Lower Leg Wound Open Wounding Event: Blister Status: Date Acquired: 04/01/2021 Comorbid Asthma, Pneumothorax, Arrhythmia, Hypertension, Type Weeks Of Treatment: 7 History: II Diabetes, Gout, Osteoarthritis, Neuropathy Clustered Wound: No Photos Wound Measurements Length: (cm) 0.3 Width: (cm) 0.2 Depth: (cm) 0.1 Area: (cm) 0.047 Volume: (cm) 0.005 % Reduction in Area: 76% % Reduction in Volume: 94.9% Epithelialization: None Tunneling: No Undermining: No Wound Description Classification: Grade 2 Exudate Amount: Medium Exudate Type: Serosanguineous Exudate Color: red, brown Foul Odor After Cleansing: No Slough/Fibrino Yes Wound Bed Granulation Amount: Large (67-100%) Exposed Structure Granulation Quality: Pink Fascia Exposed: No Necrotic Amount: Small (1-33%) Fat Layer (Subcutaneous Tissue) Exposed: Yes Necrotic Quality: Adherent Slough Tendon Exposed: No Muscle Exposed: No Joint Exposed: No Bone Exposed: No Treatment Notes Wound #18 (Lower Leg) Wound Laterality: Right, Anterior Cleanser Soap and Water  Discharge Instruction: Gently cleanse wound with antibacterial soap, rinse and pat dry prior to dressing wounds Peri-Wound Care MADILYNN, MONTANTE ([id number]) Topical Primary Dressing Hydrofera Blue Ready Transfer Foam, 2.5x2.5 (in/in) Discharge Instruction:  Apply Hydrofera Blue Ready to wound bed as directed Secondary Dressing tegaderm Discharge Instruction: applied to secure HB Secured With Compression Wrap Compression Stockings Add-Ons Electronic Signature(s) Signed: 05/26/2021 4:05:35 PM By: Donnamarie Poag Entered By: Donnamarie Poag on 05/26/2021 15:25:09 Guidice, [name] ([id number]) -------------------------------------------------------------------------------- Wound Assessment Details Patient Name: [name] Date of Service: 05/26/2021 3:15 PM Medical Record Number: [id number] Patient Account Number: [id number] Date of Birth/Sex: 1950/06/06 (70 y.o. F) Treating RN: Donnamarie Poag Primary Care Davarius Ridener: [name], Olivia Mackie Other Clinician: Referring Tomothy Eddins: [name], Olivia Mackie Treating Phineas Mcenroe/Extender: Skipper Cliche in Treatment: 47 Wound Status Wound Number: 21 Primary Skin Tear Etiology: Wound Location: Right Elbow Wound Healed - Epithelialized Wounding Event: Skin Tear/Laceration Status: Date Acquired: 05/15/2021 Comorbid Asthma, Pneumothorax, Arrhythmia, Hypertension, Type Weeks Of Treatment: 1 History: II Diabetes, Gout, Osteoarthritis, Neuropathy Clustered Wound: No Photos Wound Measurements Length: (cm) Width: (cm) Depth: (cm) Area: (cm) Volume: (cm) 0 % Reduction in Area: 100% 0 % Reduction in Volume: 100% 0 Epithelialization: Small (1-33%) 0 0 Wound Description Classification: Full Thickness Without Exposed Support Structu Exudate Amount: Medium Exudate Type: Serosanguineous Exudate Color: red, brown res Foul Odor After Cleansing: No Slough/Fibrino Yes Wound Bed Granulation Amount: None Present (0%) Exposed Structure Necrotic Amount: Large (67-100%) Fat Layer (Subcutaneous Tissue) Exposed: Yes Treatment Notes Wound #21 (Elbow) Wound Laterality: Right Cleanser Peri-Wound Care Topical Primary Dressing Secondary Dressing Secured With Compression Wrap CHYANE, GREER B.  ([id number]) Compression Stockings Add-Ons Electronic Signature(s) Signed: 05/26/2021 4:05:35 PM By: Donnamarie Poag Entered By: Donnamarie Poag on 05/26/2021 15:39:56 Favorite, [name] ([id number]) -------------------------------------------------------------------------------- [place] Details Patient Name: [name] Date of Service: 05/26/2021 3:15 PM Medical Record Number: [id number] Patient Account Number: [id number] Date of Birth/Sex: Jul 21, 1950 (70 y.o. F) Treating RN: Donnamarie Poag Primary Care Rosielee Corporan: [name], Olivia Mackie Other Clinician: Referring Ketra Duchesne: [name], Olivia Mackie Treating Alexzia Kasler/Extender: Skipper Cliche in Treatment: 45 Vital Signs Time Taken: 15:20 Temperature (F): 98.2 Height (in): 65.5 Pulse (bpm): 69 Weight (lbs): 240 Respiratory Rate (breaths/min): 16 Body Mass Index (BMI): 39.3 Blood Pressure (mmHg): 113/67 Reference Range: 80 - 120 mg / dl Electronic Signature(s) Signed: 05/26/2021 4:05:35 PM By: Donnamarie Poag Entered ByDonnamarie Poag on 05/26/2021 15:20:24

## 2021-05-26 NOTE — Progress Notes (Addendum)
DERYL, PORTS ([id number]) Visit Report for 05/26/2021 Chief Complaint Document Details Patient Name: [name], [name] Date of Service: 05/26/2021 3:15 PM Medical Record Number: [id number] Patient Account Number: [id number] Date of Birth/Sex: 08-09-1950 (71 y.o. F) Treating RN: Donnamarie Poag Primary Care Provider: [name], Olivia Mackie Other Clinician: Referring Provider: [name], Olivia Mackie Treating Provider/Extender: Skipper Cliche in Treatment: 75 Information Obtained from: Patient Chief Complaint Bilateral Leg Ulcers and Right 4thToe Ulcer Electronic Signature(s) Signed: 05/26/2021 2:53:46 PM By: Worthy Keeler PA-C Entered By: Worthy Keeler on 05/26/2021 14:53:46 Stopka, [name] ([id number]) -------------------------------------------------------------------------------- HPI Details Patient Name: [name] Date of Service: 05/26/2021 3:15 PM Medical Record Number: [id number] Patient Account Number: [id number] Date of Birth/Sex: 1950-05-02 (70 y.o. F) Treating RN: Donnamarie Poag Primary Care Provider: [name], Olivia Mackie Other Clinician: Referring Provider: [name], Olivia Mackie Treating Provider/Extender: Skipper Cliche in Treatment: 38 History of Present Illness HPI Description: 06/28/20 upon evaluation today patient appears to be doing unfortunately somewhat poorly in regard to wounds that she has over her lower extremities. She has a wound on the left distal second toe, left posterior heel, and right anterior lower leg. With that being said all in all none of the wounds appear to be too bad the one that hurts her the most is actually the wound on the posterior heel which actually is also one of the smallest. Nonetheless I think there is some callus hiding what is really going on in this area. Fortunately there does not appear to be any obvious evidence of infection which is great news. Patient does have a history of diabetes mellitus type 2, chronic venous  insufficiency, hypertension, history of DVTs, and is on long-term anticoagulant therapy, Eliquis. 07/05/20 upon evaluation today patient appears to be doing well currently with regard to her wounds. I feel like she is making progress which is great news and overall there is no signs of active infection at this time. No fevers, chills, nausea, vomiting, or diarrhea. 4/29; patient has a only a small area remaining on the tip of her left second toe and a small area remaining on the left heel. The area on the right anterior lower leg is healed. They tell me that she had remote podiatric surgery on the toes of the left foot however they are very fixed in position and I wonder whether they are making it difficult to relieve pressure in her foot wear. She is a type II diabetic with neuropathy as well. 08/01/2020 upon evaluation today patient appears to be doing well currently in regard to her wounds. In fact everything appears to be doing much better. The posterior aspect of her heel is actually giving her some trouble not the Achilles area but a small wound that we did not even have marked. In fact I had noted this before. Nonetheless we are going to addressed that today. 5/27; most of the patient's wounds are healed including the left Achilles heel left toe. Right forearm is also healed. She has a new abrasion posteriorly in the right elbow area just above the olecranon this happened today with an abrasion from her car door 6/15; the patient's wounds on the left Achilles and left second toe remain healed. The area on the right forearm is also just about healed in fact the skin I replaced last time is looking viable which is good. She had me look at the left heel again which is not in the area of the wound more towards the tip of the heel at  the Achilles insertion site as well as the tip of the left foot second toe. In the Achilles area that is clearly is friction probably in bed at night. Not really near  where the wound was. The left second toe remains healed although this is hammered and overhanging first toe also puts pressure in this area 09/13/2020 upon evaluation today patient appears to be doing well with regard to all previous wounds that she had. Everything is completely healed. With that being said I do think that the patient unfortunately has a new skin tear on the right forearm which is due to her put a Band-Aid on it and then when it pulled off this caused some issues with skin tearing. Nonetheless I think that this needs to be addressed today. Hopefully will take too long to get this to heal. She is having some issues with pain in her heel but I think this is more related to be honest to her neuropathy as opposed to anything else. Readmission: 10/30/2020 upon evaluation today patient appears to be doing poorly in regard to her legs bilaterally. She is also having an issue with her fourth toe on the left. Fortunately there does not appear to be any signs of active infection at this time. No fevers, chills, nausea, vomiting, or diarrhea. It is actually been quite a while since we have seen her. In fact June 24 was the last time that she was here in the clinic. She tells me she did not come back because things were just doing "pretty well". 11/29/2020 upon evaluation today patient actually appears to be doing well with regard to her arms those are completely healed. Her right leg is still open but doing okay. The fourth toe of the left foot does show some signs here of having some issues with necrotic tissue of an open wound. This is where a toenail was removed by podiatry. Subsequently I do think that we need to go ahead and see what we can do about getting this cleared away so being try to get some healing going forward. 12/13/2020 upon evaluation today patient appears to be doing decently well in regard to her wound on the right leg. In general I think that she is making good progress here.  Unfortunately she continues to have significant issues with swelling in the left leg is now even more swollen at this point. For that reason I do believe she would be benefited by wrapping both sides although she is not interested in doing this. Overall I think that she is definitely making some good progress here nonetheless in regard to the right leg left leg leave some to be desired. 12/27/2020 upon evaluation today patient appears to be doing well with regard to her wound in regard to the legs. Unfortunately the toe was not doing nearly as well. I am much more concerned about infection and osteomyelitis here. She had the MRI but right now were not seeing obvious evidence of redness spreading up to her foot this is good news I do not have the result of the MRI as of yet. I am good to place her on doxycycline however due to the fact that the wound does seem to be getting a little worse in regard to her toe. 01/14/2021 upon evaluation today patient appears to be doing well with regard to her ulcers. The right leg is doing well and even the toe seems to be doing well though she still has some evidence of bone exposed on  the toe which does not appear to be doing quite as good as I would like to see just and the fact that is still not covered over new tissue but looks tremendously better compared to 2 weeks ago. Overall very pleased in that regard. 01/28/2021 upon evaluation today patient's leg on the left actually appears to have new wounds this is due to her deciding to shave yesterday. Fortunately there does not appear to be any signs of infection currently. This is good news. Nonetheless I do believe that with these new areas open it would be best to wrap her leg to try to make sure we get this under control sooner rather than later. With regard to her toe there still does appear to be some evidence of bone exposed this was covered over with callus I did remove that today. Nonetheless I think this is  significantly smaller than previous. 02/11/2021 upon evaluation today patient appears to be doing okay in regard to her leg ulcers. I do not see anything obvious at this point this seems to be a complicating factor. I think that she is getting better. Were wrap of the right leg not the left leg. With that being said the left fourth Vaughan, Yuna B. ([id number]) toe ulcer still does have evidence of being open there is some drainage still on top of the wound I think we can to try to see about using something like a collagen dressing today to see if that would be of benefit for her. She is in agreement with giving this a trial. Nonetheless I am concerned about the fact that she continues to have issues here with this not healing it is confirmed chronic osteomyelitis she has been on antibiotics. I am going to extend those today as well. That is Keflex and that will be for 1 additional month. Nonetheless if she is not doing significantly better by the next time I see her I think that the biggest thing is going to be for [place] to go ahead and see what we can do about getting her started with hyperbaric oxygen therapy to try to save the toe. 02/20/2021 upon evaluation today patient presents for follow-up she actually came in a little bit early as the home health nurse was concerned about her toe becoming "gangrenous". With that being said the good news is there is [place] no evidence of this and in fact the toe seems to be doing better at this point. What was over the toe was an area that had bled significantly and then subsequently dried making it look much worse than where things actually stand. There is just a very small opening remaining in the toe. In regard to her legs everything appears to be healed bilaterally. 02/24/2021 upon evaluation today patient appears to be doing well with regard to her toe ulcer. I am actually very pleased with where we stand and I think that she is making good progress at this  time. There is still a very small opening although I think the biggest issue is the collagen seems to be getting stuck and drying up because the wound is so small we probably just need to use something to keep this moist like a little piece of Xeroform gauze and see if we get this to close. 03/07/2021 upon evaluation patient actually appears to be making excellent progress in regard to her last wound on the toe. There is just a very tiny potential area that I can even be sure at first  until I get some of the dry skin off when I did get this removed there was just a very tiny pinpoint opening that was barely even moist. Actually feel like this is very close to complete resolution. She has been using the Xeroform that is doing great. 03/25/2021 upon evaluation today patient appears to be doing well with regard to her toe ulcer. In fact this appears to be potentially completely healed. Fortunately I do not see anything draining at this point which is great news. She does have a small blood blister that is already drying up and looking okay on the third toe as well I am not sure what happened here she has an either. Otherwise she is having some weeping from the right leg this again is newer she tells me from last week weighing in that her pulmonologist and then today weighing in at the hematologist that she gained 30 pounds. This is quite significant if indeed true and I think she is to contact her primary care provider ASAP. This would account for why her right leg is weeping as well. 04/04/2020 upon evaluation today patient appears to be doing excellent in regard to her toe which I actually think is healed today. With that being said she unfortunately is having issues with her leg swelling and again several blisters on each leg that have reopened unfortunately. 05/02/2021 upon evaluation today patient appears to be doing well with regard to her wounds. In fact is down just 1 on the anterior portion of  her right leg which is actually from a blister. Otherwise she is doing awesome. 05/15/2021 upon evaluation today patient appears to be doing better in regard to her leg ulcer although she has a small irritated area from tape on the leg medially. She also has a skin tear to her right elbow region. Fortunately there does not appear to be any signs of active infection locally nor systemically at this point which is great news. No fevers, chills, nausea, vomiting, or diarrhea. 05/26/2021 upon evaluation today patient appears to be doing better in regard to the actual wounds although she is having a lot of leaking from the right leg. She is also been using her lymphedema pumps along with wearing her compression. Nonetheless she is having a lot of trouble with her breathing I am concerned that the pumps may be contributing to this. Electronic Signature(s) Signed: 05/26/2021 3:55:17 PM By: Worthy Keeler PA-C Entered By: Worthy Keeler on 05/26/2021 15:55:17 KELCI, PETRELLA ([id number]) -------------------------------------------------------------------------------- Physical Exam Details Patient Name: [name] Date of Service: 05/26/2021 3:15 PM Medical Record Number: [id number] Patient Account Number: [id number] Date of Birth/Sex: Sep 17, 1950 (70 y.o. F) Treating RN: Donnamarie Poag Primary Care Provider: [name], Olivia Mackie Other Clinician: Referring Provider: [name], Olivia Mackie Treating Provider/Extender: Skipper Cliche in Treatment: 24 Constitutional Well-nourished and well-hydrated in no acute distress. Respiratory labored, rapid respiration. Wheezing and rhonchi noted in bilateral lung fields. Psychiatric this patient is able to make decisions and demonstrates good insight into disease process. Alert and Oriented x 3. pleasant and cooperative. Notes Upon inspection patient appears to have significant issues here with her breathing. Unfortunately she is having a lot of difficulty  with breathing and again I think that she may have a fluid overload issue in general she is seeing her primary care provider tomorrow. Electronic Signature(s) Signed: 05/26/2021 3:56:15 PM By: Worthy Keeler PA-C Entered By: Worthy Keeler on 05/26/2021 15:56:14 Icenhower, [name] ([id number]) -------------------------------------------------------------------------------- Physician Orders Details Patient  Name: [name] Date of Service: 05/26/2021 3:15 PM Medical Record Number: [id number] Patient Account Number: [id number] Date of Birth/Sex: 29-Dec-1950 (71 y.o. F) Treating RN: Donnamarie Poag Primary Care Provider: [name], Olivia Mackie Other Clinician: Referring Provider: [name], Olivia Mackie Treating Provider/Extender: Skipper Cliche in Treatment: 53 Verbal / Phone Orders: No Diagnosis Coding ICD-10 Coding Code Description E11.621 Type 2 diabetes mellitus with foot ulcer L97.522 Non-pressure chronic ulcer of other part of left foot with fat layer exposed I87.2 Venous insufficiency (chronic) (peripheral) L97.812 Non-pressure chronic ulcer of other part of right lower leg with fat layer exposed L97.822 Non-pressure chronic ulcer of other part of left lower leg with fat layer exposed Z79.01 Long term (current) use of anticoagulants Follow-up Appointments o Return Appointment in 1 week. o Nurse Visit as needed [facility] for wound care. May utilize formulary equivalent dressing for wound treatment orders unless otherwise specified. Home Health Nurse may visit PRN to address patientos wound care needs. o Scheduled days for dressing changes to be completed; exception, patient has scheduled wound care visit that day. o **Please direct any NON-WOUND related issues/requests for orders to patient's Primary Care Physician. **If current dressing causes regression in wound condition, may D/C ordered dressing product/s  and apply Normal Saline Moist Dressing daily until next [place] or Other MD appointment. **Notify [facility] of regression in wound condition at [phone]. Bathing/ Shower/ Hygiene o May shower with wound dressing protected with water repellent cover or cast protector. o No tub bath. o Other: - Be careful of new skin to protect-do not shave Anesthetic (Use 'Patient Medications' Section for Anesthetic Order Entry) o Lidocaine applied to wound bed Edema Control - Lymphedema / Segmental Compressive Device / Other o Patient to wear own compression stockings. Remove compression stockings every night before going to bed and put on every morning when getting up. o Elevate, Exercise Daily and Avoid Standing for Long Periods of Time. o Elevate legs to the level of the heart and pump ankles as often as possible o Elevate leg(s) parallel to the floor when sitting. o DO YOUR BEST to sleep in the bed at night. DO NOT sleep in your recliner. Long hours of sitting in a recliner leads to swelling of the legs and/or potential wounds on your backside. Non-Wound Condition o Additional non-wound orders/instructions: - CALL PULMONARY OR PCP FOR ORDERS OF GAINING WEIGHT and wheezing Keep appt with PCP 05/27/21 or go to ED if increased wheezing or difficulty breathing Additional Orders / Instructions o Follow Nutritious Diet and Increase Protein Intake Wound Treatment Wound #18 - Lower Leg Wound Laterality: Right, Anterior Cleanser: Soap and Water 2 x Per Week/30 Days Discharge Instructions: Gently cleanse wound with antibacterial soap, rinse and pat dry prior to dressing wounds Primary Dressing: Hydrofera Blue Ready Transfer Foam, 2.5x2.5 (in/in) 2 x Per Week/30 Days Discharge Instructions: Apply Hydrofera Blue Ready to wound bed as directed BABYGIRL, TRAGER ([id number]) Secondary Dressing: Alcan Border Dressing, 4x4 (in/in) 2 x Per Week/30  Days Discharge Instructions: Apply over dressing or alternative to secure to secure in place. Electronic Signature(s) Signed: 05/26/2021 4:03:57 PM By: Worthy Keeler PA-C Signed: 05/26/2021 4:05:35 PM By: Donnamarie Poag Entered By: Donnamarie Poag on 05/26/2021 15:49:24 [name] ([id number]) -------------------------------------------------------------------------------- Problem List Details Patient Name: [name] Date of Service: 05/26/2021 3:15 PM Medical Record Number: [id number] Patient Account Number: [id number] Date of Birth/Sex: 04-13-1950 (71 y.o. F) Treating  RN: Donnamarie Poag Primary Care Provider: [name], Olivia Mackie Other Clinician: Referring Provider: [name], Olivia Mackie Treating Provider/Extender: Skipper Cliche in Treatment: 67 Active Problems ICD-10 Encounter Code Description Active Date MDM Diagnosis E11.621 Type 2 diabetes mellitus with foot ulcer 06/28/2020 No Yes L97.522 Non-pressure chronic ulcer of other part of left foot with fat layer 06/28/2020 No Yes exposed I87.2 Venous insufficiency (chronic) (peripheral) 06/28/2020 No Yes L97.812 Non-pressure chronic ulcer of other part of right lower leg with fat layer 06/28/2020 No Yes exposed L97.822 Non-pressure chronic ulcer of other part of left lower leg with fat layer 02/11/2021 No Yes exposed Z79.01 Long term (current) use of anticoagulants 06/28/2020 No Yes Inactive Problems ICD-10 Code Description Active Date Inactive Date I10 Essential (primary) hypertension 06/28/2020 06/28/2020 Z86.718 Personal history of other venous thrombosis and embolism 06/28/2020 06/28/2020 Resolved Problems ICD-10 Code Description Active Date Resolved Date M86.572 Other chronic hematogenous osteomyelitis, left ankle and foot 02/11/2021 02/11/2021 L97.322 Non-pressure chronic ulcer of left ankle with fat layer exposed 06/28/2020 06/28/2020 S50.811A Abrasion of right forearm, initial encounter 10/30/2020 10/30/2020 SHALYNN, JORSTAD  ([id number]) S50.311D Abrasion of right elbow, subsequent encounter 08/16/2020 08/16/2020 Electronic Signature(s) Signed: 05/26/2021 2:53:42 PM By: Worthy Keeler PA-C Entered By: Worthy Keeler on 05/26/2021 14:53:42 Armenteros, [name] ([id number]) -------------------------------------------------------------------------------- Progress Note Details Patient Name: [name] Date of Service: 05/26/2021 3:15 PM Medical Record Number: [id number] Patient Account Number: [id number] Date of Birth/Sex: [birth year] (70 y.o. F) Treating RN: Donnamarie Poag Primary Care Provider: [name], Olivia Mackie Other Clinician: Referring Provider: [name], Olivia Mackie Treating Provider/Extender: Skipper Cliche in Treatment: 90 Subjective Chief Complaint Information obtained from Patient Bilateral Leg Ulcers and Right 4thToe Ulcer History of Present Illness (HPI) 06/28/20 upon evaluation today patient appears to be doing unfortunately somewhat poorly in regard to wounds that she has over her lower extremities. She has a wound on the left distal second toe, left posterior heel, and right anterior lower leg. With that being said all in all none of the wounds appear to be too bad the one that hurts her the most is actually the wound on the posterior heel which actually is also one of the smallest. Nonetheless I think there is some callus hiding what is really going on in this area. Fortunately there does not appear to be any obvious evidence of infection which is great news. Patient does have a history of diabetes mellitus type 2, chronic venous insufficiency, hypertension, history of DVTs, and is on long-term anticoagulant therapy, Eliquis. 07/05/20 upon evaluation today patient appears to be doing well currently with regard to her wounds. I feel like she is making progress which is great news and overall there is no signs of active infection at this time. No fevers, chills, nausea, vomiting, or diarrhea. 4/29;  patient has a only a small area remaining on the tip of her left second toe and a small area remaining on the left heel. The area on the right anterior lower leg is healed. They tell me that she had remote podiatric surgery on the toes of the left foot however they are very fixed in position and I wonder whether they are making it difficult to relieve pressure in her foot wear. She is a type II diabetic with neuropathy as well. 08/01/2020 upon evaluation today patient appears to be doing well currently in regard to her wounds. In fact everything appears to be doing much better. The posterior aspect of her heel is actually giving her some trouble not the Achilles area  but a small wound that we did not even have marked. In fact I had noted this before. Nonetheless we are going to addressed that today. 5/27; most of the patient's wounds are healed including the left Achilles heel left toe. Right forearm is also healed. She has a new abrasion posteriorly in the right elbow area just above the olecranon this happened today with an abrasion from her car door 6/15; the patient's wounds on the left Achilles and left second toe remain healed. The area on the right forearm is also just about healed in fact the skin I replaced last time is looking viable which is good. She had me look at the left heel again which is not in the area of the wound more towards the tip of the heel at the Achilles insertion site as well as the tip of the left foot second toe. In the Achilles area that is clearly is friction probably in bed at night. Not really near where the wound was. The left second toe remains healed although this is hammered and overhanging first toe also puts pressure in this area 09/13/2020 upon evaluation today patient appears to be doing well with regard to all previous wounds that she had. Everything is completely healed. With that being said I do think that the patient unfortunately has a new skin tear on the  right forearm which is due to her put a Band-Aid on it and then when it pulled off this caused some issues with skin tearing. Nonetheless I think that this needs to be addressed today. Hopefully will take too long to get this to heal. She is having some issues with pain in her heel but I think this is more related to be honest to her neuropathy as opposed to anything else. Readmission: 10/30/2020 upon evaluation today patient appears to be doing poorly in regard to her legs bilaterally. She is also having an issue with her fourth toe on the left. Fortunately there does not appear to be any signs of active infection at this time. No fevers, chills, nausea, vomiting, or diarrhea. It is actually been quite a while since we have seen her. In fact June 24 was the last time that she was here in the clinic. She tells me she did not come back because things were just doing "pretty well". 11/29/2020 upon evaluation today patient actually appears to be doing well with regard to her arms those are completely healed. Her right leg is still open but doing okay. The fourth toe of the left foot does show some signs here of having some issues with necrotic tissue of an open wound. This is where a toenail was removed by podiatry. Subsequently I do think that we need to go ahead and see what we can do about getting this cleared away so being try to get some healing going forward. 12/13/2020 upon evaluation today patient appears to be doing decently well in regard to her wound on the right leg. In general I think that she is making good progress here. Unfortunately she continues to have significant issues with swelling in the left leg is now even more swollen at this point. For that reason I do believe she would be benefited by wrapping both sides although she is not interested in doing this. Overall I think that she is definitely making some good progress here nonetheless in regard to the right leg left leg leave some to  be desired. 12/27/2020 upon evaluation today patient appears to  be doing well with regard to her wound in regard to the legs. Unfortunately the toe was not doing nearly as well. I am much more concerned about infection and osteomyelitis here. She had the MRI but right now were not seeing obvious evidence of redness spreading up to her foot this is good news I do not have the result of the MRI as of yet. I am good to place her on doxycycline however due to the fact that the wound does seem to be getting a little worse in regard to her toe. 01/14/2021 upon evaluation today patient appears to be doing well with regard to her ulcers. The right leg is doing well and even the toe seems to be doing well though she still has some evidence of bone exposed on the toe which does not appear to be doing quite as good as I would like to see just and the fact that is still not covered over new tissue but looks tremendously better compared to 2 weeks ago. Overall very pleased in that regard. 01/28/2021 upon evaluation today patient's leg on the left actually appears to have new wounds this is due to her deciding to shave yesterday. Fortunately there does not appear to be any signs of infection currently. This is good news. Nonetheless I do believe that with these new areas open it would be best to wrap her leg to try to make sure we get this under control sooner rather than later. With regard to her toe there still does Chirino, Amsi B. ([id number]) appear to be some evidence of bone exposed this was covered over with callus I did remove that today. Nonetheless I think this is significantly smaller than previous. 02/11/2021 upon evaluation today patient appears to be doing okay in regard to her leg ulcers. I do not see anything obvious at this point this seems to be a complicating factor. I think that she is getting better. Were wrap of the right leg not the left leg. With that being said the left fourth toe ulcer  still does have evidence of being open there is some drainage still on top of the wound I think we can to try to see about using something like a collagen dressing today to see if that would be of benefit for her. She is in agreement with giving this a trial. Nonetheless I am concerned about the fact that she continues to have issues here with this not healing it is confirmed chronic osteomyelitis she has been on antibiotics. I am going to extend those today as well. That is Keflex and that will be for 1 additional month. Nonetheless if she is not doing significantly better by the next time I see her I think that the biggest thing is going to be for [place] to go ahead and see what we can do about getting her started with hyperbaric oxygen therapy to try to save the toe. 02/20/2021 upon evaluation today patient presents for follow-up she actually came in a little bit early as the home health nurse was concerned about her toe becoming "gangrenous". With that being said the good news is there is [place] no evidence of this and in fact the toe seems to be doing better at this point. What was over the toe was an area that had bled significantly and then subsequently dried making it look much worse than where things actually stand. There is just a very small opening remaining in the toe. In regard to her legs  everything appears to be healed bilaterally. 02/24/2021 upon evaluation today patient appears to be doing well with regard to her toe ulcer. I am actually very pleased with where we stand and I think that she is making good progress at this time. There is still a very small opening although I think the biggest issue is the collagen seems to be getting stuck and drying up because the wound is so small we probably just need to use something to keep this moist like a little piece of Xeroform gauze and see if we get this to close. 03/07/2021 upon evaluation patient actually appears to be making excellent progress  in regard to her last wound on the toe. There is just a very tiny potential area that I can even be sure at first until I get some of the dry skin off when I did get this removed there was just a very tiny pinpoint opening that was barely even moist. Actually feel like this is very close to complete resolution. She has been using the Xeroform that is doing great. 03/25/2021 upon evaluation today patient appears to be doing well with regard to her toe ulcer. In fact this appears to be potentially completely healed. Fortunately I do not see anything draining at this point which is great news. She does have a small blood blister that is already drying up and looking okay on the third toe as well I am not sure what happened here she has an either. Otherwise she is having some weeping from the right leg this again is newer she tells me from last week weighing in that her pulmonologist and then today weighing in at the hematologist that she gained 30 pounds. This is quite significant if indeed true and I think she is to contact her primary care provider ASAP. This would account for why her right leg is weeping as well. 04/04/2020 upon evaluation today patient appears to be doing excellent in regard to her toe which I actually think is healed today. With that being said she unfortunately is having issues with her leg swelling and again several blisters on each leg that have reopened unfortunately. 05/02/2021 upon evaluation today patient appears to be doing well with regard to her wounds. In fact is down just 1 on the anterior portion of her right leg which is actually from a blister. Otherwise she is doing awesome. 05/15/2021 upon evaluation today patient appears to be doing better in regard to her leg ulcer although she has a small irritated area from tape on the leg medially. She also has a skin tear to her right elbow region. Fortunately there does not appear to be any signs of active infection locally nor  systemically at this point which is great news. No fevers, chills, nausea, vomiting, or diarrhea. 05/26/2021 upon evaluation today patient appears to be doing better in regard to the actual wounds although she is having a lot of leaking from the right leg. She is also been using her lymphedema pumps along with wearing her compression. Nonetheless she is having a lot of trouble with her breathing I am concerned that the pumps may be contributing to this. Objective Constitutional Well-nourished and well-hydrated in no acute distress. Vitals Time Taken: 3:20 PM, Height: 65.5 in, Weight: 240 lbs, BMI: 39.3, Temperature: 98.2 F, Pulse: 69 bpm, Respiratory Rate: 16 breaths/min, Blood Pressure: 113/67 mmHg. Respiratory labored, rapid respiration. Wheezing and rhonchi noted in bilateral lung fields. Psychiatric this patient is able to make decisions and  demonstrates good insight into disease process. Alert and Oriented x 3. pleasant and cooperative. General Notes: Upon inspection patient appears to have significant issues here with her breathing. Unfortunately she is having a lot of difficulty with breathing and again I think that she may have a fluid overload issue in general she is seeing her primary care provider tomorrow. Integumentary (Hair, Skin) Wound #18 status is Open. Original cause of wound was Blister. The date acquired was: 04/01/2021. The wound has been in treatment 7 weeks. RANATA, LAUGHERY B. ([id number]) The wound is located on the Right,Anterior Lower Leg. The wound measures 0.3cm length x 0.2cm width x 0.1cm depth; 0.047cm^2 area and 0.005cm^3 volume. There is Fat Layer (Subcutaneous Tissue) exposed. There is no tunneling or undermining noted. There is a medium amount of serosanguineous drainage noted. There is large (67-100%) pink granulation within the wound bed. There is a small (1-33%) amount of necrotic tissue within the wound bed including Adherent Slough. Wound #21 status is  Healed - Epithelialized. Original cause of wound was Skin Tear/Laceration. The date acquired was: 05/15/2021. The wound has been in treatment 1 weeks. The wound is located on the Right Elbow. The wound measures 0cm length x 0cm width x 0cm depth; 0cm^2 area and 0cm^3 volume. There is Fat Layer (Subcutaneous Tissue) exposed. There is a medium amount of serosanguineous drainage noted. There is no granulation within the wound bed. There is a large (67-100%) amount of necrotic tissue within the wound bed. Assessment Active Problems ICD-10 Type 2 diabetes mellitus with foot ulcer Non-pressure chronic ulcer of other part of left foot with fat layer exposed Venous insufficiency (chronic) (peripheral) Non-pressure chronic ulcer of other part of right lower leg with fat layer exposed Non-pressure chronic ulcer of other part of left lower leg with fat layer exposed Long term (current) use of anticoagulants Plan Follow-up Appointments: Return Appointment in 1 week. Nurse Visit as needed Home Health: [place]: - [place] for wound care. May utilize formulary equivalent dressing for wound treatment orders unless otherwise specified. Home Health Nurse may visit PRN to address patient s wound care needs. Scheduled days for dressing changes to be completed; exception, patient has scheduled wound care visit that day. **Please direct any NON-WOUND related issues/requests for orders to patient's Primary Care Physician. **If current dressing causes regression in wound condition, may D/C ordered dressing product/s and apply Normal Saline Moist Dressing daily until next [place] or Other MD appointment. **Notify [facility] of regression in wound condition at [phone]. Bathing/ Shower/ Hygiene: May shower with wound dressing protected with water repellent cover or cast protector. No tub bath. Other: - Be careful of new skin to protect-do not  shave Anesthetic (Use 'Patient Medications' Section for Anesthetic Order Entry): Lidocaine applied to wound bed Edema Control - Lymphedema / Segmental Compressive Device / Other: Patient to wear own compression stockings. Remove compression stockings every night before going to bed and put on every morning when getting up. Elevate, Exercise Daily and Avoid Standing for Long Periods of Time. Elevate legs to the level of the heart and pump ankles as often as possible Elevate leg(s) parallel to the floor when sitting. DO YOUR BEST to sleep in the bed at night. DO NOT sleep in your recliner. Long hours of sitting in a recliner leads to swelling of the legs and/or potential wounds on your backside. Non-Wound Condition: Additional non-wound orders/instructions: - CALL PULMONARY OR PCP FOR ORDERS OF GAINING WEIGHT and wheezing  Keep appt with PCP 05/27/21 or go to ED if increased wheezing or difficulty breathing Additional Orders / Instructions: Follow Nutritious Diet and Increase Protein Intake WOUND #18: - Lower Leg Wound Laterality: Right, Anterior Cleanser: Soap and Water 2 x Per Week/30 Days Discharge Instructions: Gently cleanse wound with antibacterial soap, rinse and pat dry prior to dressing wounds Primary Dressing: Hydrofera Blue Ready Transfer Foam, 2.5x2.5 (in/in) 2 x Per Week/30 Days Discharge Instructions: Apply Hydrofera Blue Ready to wound bed as directed Secondary Dressing: [name] Dressing, 4x4 (in/in) 2 x Per Week/30 Days Discharge Instructions: Apply over dressing or alternative to secure to secure in place. 1. Would recommend currently that she avoid the lymphedema pumps until she at least talk to primary care and see if there is anything that needs to be done to correct this issue in general. 2. I am also can recommend at this time that we have the patient go ahead and continue with the [facility] which I think is doing quite well. 3. I am going to suggest  that she should discuss with her primary tomorrow the lymphedema pumps in relation to her breathing and see what Fatima, Linna B. ([id number]) they think about this. Nonetheless my biggest issue here is that I feel like the fluid getting out of her lower extremities may be collecting more in her chest area which could be affecting the breathing. However I am unsure how much her congestive heart failure could be playing a role here. Her last ejection fraction measurement and echo was actually in January of last year and she was at 60 to 65% which is not bad at all. There is a lot going on here however. We will see patient back for reevaluation in 1 week here in the clinic. If anything worsens or changes patient will contact our office for additional recommendations. Electronic Signature(s) Signed: 05/26/2021 3:57:24 PM By: Worthy Keeler PA-C Entered By: Worthy Keeler on 05/26/2021 15:57:23 Motter, [name] ([id number]) -------------------------------------------------------------------------------- SuperBill Details Patient Name: [name] Date of Service: 05/26/2021 Medical Record Number: [id number] Patient Account Number: [id number] Date of Birth/Sex: 1951-01-07 (70 y.o. F) Treating RN: Donnamarie Poag Primary Care Provider: [name], Olivia Mackie Other Clinician: Referring Provider: [name], Olivia Mackie Treating Provider/Extender: Skipper Cliche in Treatment: 47 Diagnosis Coding ICD-10 Codes Code Description E11.621 Type 2 diabetes mellitus with foot ulcer L97.522 Non-pressure chronic ulcer of other part of left foot with fat layer exposed I87.2 Venous insufficiency (chronic) (peripheral) L97.812 Non-pressure chronic ulcer of other part of right lower leg with fat layer exposed L97.822 Non-pressure chronic ulcer of other part of left lower leg with fat layer exposed Z79.01 Long term (current) use of anticoagulants Facility Procedures The patient participates with Medicare or  their insurance follows the Medicare Facility Guidelines: CPT4 Code Description Modifier Quantity [id number] [place] VISIT-LEV 3 EST PT 1 Physician Procedures CPT4 Code: 3382505 Description: 39767 - WC PHYS LEVEL 4 - EST PT Modifier: Quantity: 1 CPT4 Code: Description: ICD-10 Diagnosis Description E11.621 Type 2 diabetes mellitus with foot ulcer L97.522 Non-pressure chronic ulcer of other part of left foot with fat layer ex I87.2 Venous insufficiency (chronic) (peripheral) L97.812 Non-pressure chronic  ulcer of other part of right lower leg with fat la Modifier: posed yer exposed Quantity: Electronic Signature(s) Signed: 05/26/2021 3:57:55 PM By: Worthy Keeler PA-C Entered By: Worthy Keeler on 05/26/2021 15:57:55

## 2021-05-27 ENCOUNTER — Other Ambulatory Visit: Payer: Self-pay

## 2021-05-27 ENCOUNTER — Encounter: Payer: Self-pay | Admitting: Internal Medicine

## 2021-05-27 ENCOUNTER — Ambulatory Visit (INDEPENDENT_AMBULATORY_CARE_PROVIDER_SITE_OTHER): Payer: Medicare HMO | Admitting: Internal Medicine

## 2021-05-27 VITALS — BP 124/84 | HR 61 | Temp 97.3°F | Ht 66.0 in | Wt 276.0 lb

## 2021-05-27 DIAGNOSIS — N390 Urinary tract infection, site not specified: Secondary | ICD-10-CM

## 2021-05-27 DIAGNOSIS — E039 Hypothyroidism, unspecified: Secondary | ICD-10-CM | POA: Diagnosis not present

## 2021-05-27 DIAGNOSIS — E1151 Type 2 diabetes mellitus with diabetic peripheral angiopathy without gangrene: Secondary | ICD-10-CM | POA: Diagnosis not present

## 2021-05-27 DIAGNOSIS — I890 Lymphedema, not elsewhere classified: Secondary | ICD-10-CM

## 2021-05-27 DIAGNOSIS — M25512 Pain in left shoulder: Secondary | ICD-10-CM

## 2021-05-27 DIAGNOSIS — R1084 Generalized abdominal pain: Secondary | ICD-10-CM | POA: Diagnosis not present

## 2021-05-27 DIAGNOSIS — E559 Vitamin D deficiency, unspecified: Secondary | ICD-10-CM

## 2021-05-27 DIAGNOSIS — N200 Calculus of kidney: Secondary | ICD-10-CM | POA: Diagnosis not present

## 2021-05-27 DIAGNOSIS — R109 Unspecified abdominal pain: Secondary | ICD-10-CM

## 2021-05-27 DIAGNOSIS — R600 Localized edema: Secondary | ICD-10-CM | POA: Diagnosis not present

## 2021-05-27 DIAGNOSIS — E1159 Type 2 diabetes mellitus with other circulatory complications: Secondary | ICD-10-CM

## 2021-05-27 DIAGNOSIS — I10 Essential (primary) hypertension: Secondary | ICD-10-CM

## 2021-05-27 DIAGNOSIS — I152 Hypertension secondary to endocrine disorders: Secondary | ICD-10-CM | POA: Diagnosis not present

## 2021-05-27 DIAGNOSIS — R319 Hematuria, unspecified: Secondary | ICD-10-CM

## 2021-05-27 DIAGNOSIS — G8929 Other chronic pain: Secondary | ICD-10-CM

## 2021-05-27 DIAGNOSIS — Z1231 Encounter for screening mammogram for malignant neoplasm of breast: Secondary | ICD-10-CM

## 2021-05-27 DIAGNOSIS — R635 Abnormal weight gain: Secondary | ICD-10-CM | POA: Diagnosis not present

## 2021-05-27 NOTE — Patient Instructions (Addendum)
Title Provider type  MD Physician   Primary Contact Information  Phone Fax E-mail Address  [phone] Not available [address] [address]     Specialties     Vascular Surgery, Radiology, Interventional Cardiology           Kidney Stones Kidney stones are solid, rock-like deposits that form inside of the kidneys. The kidneys are a pair of organs that make urine. A kidney stone may form in a kidney and move into other parts of the urinary tract, including the tubes that connect the kidneys to the bladder (ureters), the bladder, and the tube that carries urine out of the body (urethra). As the stone moves through these areas, it can cause intense pain and block the flow of urine. Kidney stones are created when high levels of certain minerals are found in the urine. The stones are usually passed out of the body through urination, but in some cases, medical treatment may be needed to remove them. What are the causes? Kidney stones may be caused by: A condition in which certain glands produce too much parathyroid hormone (primary hyperparathyroidism), which causes too much calcium buildup in the blood. A buildup of uric acid crystals in the bladder (hyperuricosuria). Uric acid is a chemical that the body produces when you eat certain foods. It usually exits the body in the urine. Narrowing (stricture) of one or both of the ureters. A kidney blockage that is present at birth (congenital obstruction). Past surgery on the kidney or the ureters, such as gastric bypass surgery. What increases the risk? The following factors may make you more likely to develop this condition: Having had a kidney stone in the past. Having a family history of kidney stones. Not drinking enough water. Eating a diet that is high in protein, salt (sodium), or sugar. Being overweight or obese. What are the signs or symptoms? Symptoms of a kidney stone may include: Pain in the side of the  abdomen, right below the ribs (flank pain). Pain usually spreads (radiates) to the groin. Needing to urinate frequently or urgently. Painful urination. Blood in the urine (hematuria). Nausea. Vomiting. Fever and chills. How is this diagnosed? This condition may be diagnosed based on: Your symptoms and medical history. A physical exam. Blood tests. Urine tests. These may be done before and after the stone passes out of your body through urination. Imaging tests, such as a CT scan, abdominal X-ray, or ultrasound. A procedure to examine the inside of the bladder (cystoscopy). How is this treated? Treatment for kidney stones depends on the size, location, and makeup of the stones. Kidney stones will often pass out of the body through urination. You may need to: Increase your fluid intake to help pass the stone. In some cases, you may be given fluids through an IV and may need to be monitored at the hospital. Take medicine for pain. Make changes in your diet to help prevent kidney stones from coming back. Sometimes, medical procedures are needed to remove a kidney stone. This may involve: A procedure to break up kidney stones using: A focused beam of light (laser therapy). Shock waves (extracorporeal shock wave lithotripsy). Surgery to remove kidney stones. This may be needed if you have severe pain or have stones that block your urinary tract. Follow these instructions at home: Medicines Take over-the-counter and prescription medicines only as told by your health care provider. Ask your health care provider if the medicine prescribed to you requires you  to avoid driving or using heavy machinery. Eating and drinking Drink enough fluid to keep your urine pale yellow. You may be instructed to drink at least 8-10 glasses of water each day. This will help you pass the kidney stone. If directed, change your diet. This may include: Limiting how much sodium you eat. Eating more fruits and  vegetables. Limiting how much animal protein--such as red meat, poultry, fish, and eggs--you eat. Follow instructions from your health care provider about eating or drinking restrictions. General instructions Collect urine samples as told by your health care provider. You may need to collect a urine sample: 24 hours after you pass the stone. 8-12 weeks after passing the kidney stone, and every 6-12 months after that. Strain your urine every time you urinate, for as long as directed. Use the strainer that your health care provider recommends. Do not throw out the kidney stone after passing it. Keep the stone so it can be tested by your health care provider. Testing the makeup of your kidney stone may help prevent you from getting kidney stones in the future. Keep all follow-up visits as told by your health care provider. This is important. You may need follow-up X-rays or ultrasounds to make sure that your stone has passed. How is this prevented? To prevent another kidney stone: Drink enough fluid to keep your urine pale yellow. This is the best way to prevent kidney stones. Eat a healthy diet and follow recommendations from your health care provider about foods to avoid. You may be instructed to eat a low-protein diet. Recommendations vary depending on the type of kidney stone that you have. Maintain a healthy weight. Where to find more information [place] [facility]): www.kidney.[facility]): www.urologyhealth.org Contact a health care provider if: You have pain that gets worse or does not get better with medicine. Get help right away if: You have a fever or chills. You develop severe pain. You develop new abdominal pain. You faint. You are unable to urinate. Summary Kidney stones are solid, rock-like deposits that form inside of the kidneys. Kidney stones can cause nausea, vomiting, blood in the urine, abdominal pain, and the urge to urinate  frequently. Treatment for kidney stones depends on the size, location, and makeup of the stones. Kidney stones will often pass out of the body through urination. Kidney stones can be prevented by drinking enough fluids, eating a healthy diet, and maintaining a healthy weight. This information is not intended to replace advice given to you by your health care provider. Make sure you discuss any questions you have with your health care provider. Document Revised: 07/22/2018 Document Reviewed: 07/26/2018 Elsevier Patient Education  [employer].   Peripheral Vascular Disease Peripheral vascular disease (PVD) is a disease of the blood vessels. PVD may also be called peripheral artery disease (PAD) or poor circulation. PVD is the blocking or hardening of the arteries anywhere within the circulatory system beyond the heart. This can result in a decreased supply of blood to the arms, legs, and internal organs, such as the stomach or kidneys. However, PVD most often affects a person's lower legs and feet. Without treatment, PVD often worsens. PVD can lead to acute limb ischemia. This occurs when an arm or leg suddenly has trouble getting enough blood. This is a medical emergency. What are the causes? The most common cause of PVD is atherosclerosis. This is a buildup of fatty material and other substances (plaque)inside your arteries. Pieces of plaque can  break off from the walls of an artery and become stuck in a smaller artery, blocking blood flow and possibly causing acute limb ischemia. Other common causes of PVD include: Blood clots that form inside the blood vessels. Injuries to blood vessels. Diseases that cause inflammation of blood vessels or cause blood vessel tightening (spasms). What increases the risk? The following factors may make you more likely to develop this condition: A family history of PVD. Common medical conditions, including: High cholesterol. Diabetes. High blood pressure  (hypertension). Heart disease. Known atherosclerotic disease in another area of the body. Past injury, such as burns or a broken bone. Other medical conditions, such as: Buerger's disease. This is caused by inflamed blood vessels in your hands and feet. Some forms of arthritis. Birth defects that affect the arteries in your legs. Kidney disease. Using tobacco and nicotine products. Not getting enough exercise. Obesity. Being age 39 or older, or being age 18 or older and having the other risk factors. What are the signs or symptoms? This condition may cause different symptoms. Your symptoms depend on what body part is not getting enough blood. Common signs and symptoms include: Cramps in your buttocks, legs, and feet. Intermittent claudication. This is pain and weakness in your legs during activity that resolves with rest. Leg pain at rest and leg numbness, tingling, or weakness. Coldness in a leg or foot, especially when compared to the other leg or foot. Skin or hair changes. These can include: Hair loss. Shiny skin. Pale or bluish skin. Thick toenails. Inability to get or maintain an erection (erectile dysfunction). Tiredness (fatigue). Weak pulse or no pulse in the feet. People with PVD are more likely to develop open wounds (ulcers) and sores on their toes, feet, or legs. The ulcers or sores may take longer than normal to heal. How is this diagnosed? PVD is diagnosed based on your signs and symptoms, a physical exam, and your medical history. You may also have other tests to find the cause. Tests include: Ankle-brachial index test.This test compares the blood pressure readings of the legs and arms. This may also include an exercise ankle-brachial index test in which you walk on a treadmill to check your symptoms. Doppler ultrasound. This takes pictures of blood flow through your blood vessels. Imaging studies that use dye to show blood flow. These are: CT angiogram. Magnetic  resonance angiogram, or MRA. How is this treated? Treatment for PVD depends on the cause of your condition, how severe your symptoms are, and your age. Underlying causes need to be treated and controlled. These include long-term (chronic) conditions, such as diabetes, high cholesterol, and hypertension. Treatment may include: Lifestyle changes, such as: Quitting tobacco use. Exercising regularly. Following a low-fat, low-cholesterol diet. Not drinking alcohol. Taking medicines, such as: Blood thinners to prevent blood clots. Medicines to improve blood flow. Medicines to improve cholesterol levels. Procedures, such as: Angioplasty. This uses an inflated balloon to open a blocked artery and improve blood flow. Stent implant. This inserts a small mesh tube to keep a blocked artery open. Peripheral bypass surgery. This reroutes blood flow around a blocked artery. Surgery to remove dead tissue from an infected wound (debridement). Amputation. This is surgical removal of the affected limb. It may be necessary in cases of acute limb ischemia when medical or surgical treatments have not helped. Follow these instructions at home: Medicines Take over-the-counter and prescription medicines only as told by your health care provider. If you are taking blood thinners: Talk with your health  care provider before you take any medicines that contain aspirin or NSAIDs, such as ibuprofen. These medicines increase your risk for dangerous bleeding. Take your medicine exactly as told, at the same time every day. Avoid activities that could cause injury or bruising, and follow instructions about how to prevent falls. Wear a medical alert bracelet or carry a card that lists what medicines you take. Lifestyle   Exercise regularly. Ask your health care provider about some good activities for you. Talk with your health care provider about maintaining a healthy weight. If needed, ask about losing weight. Eat a diet  that is low in fat and cholesterol. If you need help, talk with your health care provider. Do not drink alcohol. Do not use any products that contain nicotine or tobacco. These products include cigarettes, chewing tobacco, and vaping devices, such as e-cigarettes. If you need help quitting, ask your health care provider. General instructions Take good care of your feet. To do this: Wear comfortable shoes that fit well. Check your feet often for any cuts or sores. Get an annual influenza vaccine. Keep all follow-up visits. This is important. Where to find more information Society for Vascular Surgery: vascular.org American Heart Association: heart.org National Heart, Lung, and Blood Institute: https://www.[place].biz/ Contact a health care provider if: You have leg cramps while walking. You have leg pain when you rest. Your leg or foot feels cold. Your skin changes color. You have erectile dysfunction. You have cuts or sores on your legs or feet that do not heal. Get help right away if: You have sudden changes in color and feeling of your arms or legs, such as: Your arm or leg turns cold, numb, and blue. Your arm or leg becomes red, warm, swollen, painful, or numb. You have any symptoms of a stroke. "BE FAST" is an easy way to remember the main warning signs of a stroke: B - Balance. Signs are dizziness, sudden trouble walking, or loss of balance. E - Eyes. Signs are trouble seeing or a sudden change in vision. F - Face. Signs are sudden weakness or numbness of the face, or the face or eyelid drooping on one side. A - Arms. Signs are weakness or numbness in an arm. This happens suddenly and usually on one side of the body. S - Speech. Signs are sudden trouble speaking, slurred speech, or trouble understanding what people say. T - Time. Time to call emergency services. Write down what time symptoms started. You have other signs of a stroke, such as: A sudden, severe headache with no known  cause. Nausea or vomiting. Seizure. You have chest pain or trouble breathing. These symptoms may represent a serious problem that is an emergency. Do not wait to see if the symptoms will go away. Get medical help right away. Call your local emergency services (911 in the [place] Do not drive yourself to the hospital. Summary Peripheral vascular disease (PVD) is a disease of the blood vessels. PVD is the blocking or hardening of the arteries anywhere within the circulatory system beyond the heart. PVD may cause different symptoms. Your symptoms depend on what part of your body is not getting enough blood. Treatment for PVD depends on what caused it, how severe your symptoms are, and your age. This information is not intended to replace advice given to you by your health care provider. Make sure you discuss any questions you have with your health care provider. Document Revised: 09/11/2019 Document Reviewed: 09/11/2019 Elsevier Patient Education  2022 [employer] ° °

## 2021-05-27 NOTE — Progress Notes (Signed)
Chief Complaint  Patient presents with   Follow-up   F/u with husband  1. Wants referral to urology for kidney stone history having blood in urine and right flank, recurrent uti and right sided ab pain 9/10 tried otc tylenol for help when needed and has hydrocodone as well  2. Left shoulder pain with abnormal MRI 01/2021 now f/u [facility] and pending injection under US  IMPRESSION: 1. Severe osteoarthritis of the left glenohumeral joint. 2. Moderate tendinosis of the supraspinatus tendon with a small partial-thickness articular surface tear.   Electronically Signed   By: Kathreen Devoid M.D.   On: 01/31/2021 09:27   3. C/o weight gain and leg swelling on lasix 10 mg qd prn  Elevated bnp 225 03/2021 will repeat ech o 4. PAD rec f/u with Dr. Lucky Cowboy as she has chronic wounds slow to heal and also f/u with wound clinic Dr. Lerry Liner in [place]    Review of Systems  Constitutional:  Negative for weight loss.  HENT:  Negative for hearing loss.   Eyes:  Negative for blurred vision.  Respiratory:  Negative for shortness of breath.   Cardiovascular:  Positive for leg swelling. Negative for chest pain.  Gastrointestinal:  Negative for abdominal pain and blood in stool.  Genitourinary:  Negative for dysuria.  Musculoskeletal:  Negative for falls and joint pain.  Skin:  Negative for rash.  Neurological:  Negative for headaches.  Psychiatric/Behavioral:  Negative for depression.   Past Medical History:  Diagnosis Date   Abnormal antibody titer    Anxiety and depression    Arthritis    Asthma    Atypical chest pain    a. 03/2018 MV: No ischemia/infarct. EF >65%.   Carotid arterial disease ([place])    a. 0/2774 U/S: RICA <12, LICA nl.   Cystocele    Depression    Diabetes ([place])    DVT (deep venous thrombosis) ([place])    Righ calf   Dyspnea    11/08/2018 - "more now due to lack of exercise"   Edema    GERD (gastroesophageal reflux disease)    Heart murmur    a. 08/2013 Echo: EF  60-65%, Mild MR (poss veg seen on TTE, not seen on TEE), nl LV size with mod conc LVH. Mild LAE; b. 08/2019 Echo: EF 60-65%, no rwma. Nl RV size/fxn. No significant valvular dzs.   History of IBS    History of kidney stones    History of methicillin resistant staphylococcus aureus (MRSA) 2008   History of multiple strokes    Hyperlipidemia    Hypertension    Hypothyroidism    Insomnia    Kidney stone    kidney stones with lithotripsy .  [facility]- "adrenal glands"   Neuropathy involving both lower extremities    Non-healing ulcer of left foot (HCC)    Obesity, Class II, BMI 35-39.9, with comorbidity    PAF (paroxysmal atrial fibrillation) ([place])    a. 07/2013 Event monitor: Freq PVCs and 3 short runs of Afib-->CHA2DS2VASc = 7-->Eliquis.   Peripheral vascular disease ([place])    a. 03/2018 LE Angio: Nl renal arteries. Nl aorta & iliacs. Nl LLE arterial flow; b. 06/2020 ABI's:   Sleep apnea    use C-PAP   Stroke ([place])    03/2020   Urinary incontinence    Venous stasis dermatitis of both lower extremities    Past Surgical History:  Procedure Laterality Date   ANKLE SURGERY Right    APPLICATION  OF WOUND VAC Left 06/27/2015   Procedure: APPLICATION OF WOUND VAC ( POSSIBLE ) ;  Surgeon: [name], MD;  Location: [facility] ORS;  Service: Vascular;  Laterality: Left;   APPLICATION OF WOUND VAC Left 09/25/2016   Procedure: APPLICATION OF WOUND VAC;  Surgeon: [name], DPM;  Location: [facility];  Service: Podiatry;  Laterality: Left;   arm surgery     right     CHOLECYSTECTOMY     COLONOSCOPY     COLONOSCOPY WITH PROPOFOL N/A 01/11/2017   Procedure: COLONOSCOPY WITH PROPOFOL;  Surgeon: [name], MD;  Location: [facility] ENDOSCOPY;  Service: Endoscopy;  Laterality: N/A;   COLONOSCOPY WITH PROPOFOL N/A 02/22/2017   Procedure: COLONOSCOPY WITH PROPOFOL;  Surgeon: [name], MD;  Location: [facility] ENDOSCOPY;  Service: Endoscopy;  Laterality: N/A;   COLONOSCOPY WITH PROPOFOL N/A  05/31/2017   Procedure: COLONOSCOPY WITH PROPOFOL;  Surgeon: [name], MD;  Location: [facility] ENDOSCOPY;  Service: Endoscopy;  Laterality: N/A;   ESOPHAGOGASTRODUODENOSCOPY (EGD) WITH PROPOFOL N/A 01/11/2017   Procedure: ESOPHAGOGASTRODUODENOSCOPY (EGD) WITH PROPOFOL;  Surgeon: [name], MD;  Location: [facility] ENDOSCOPY;  Service: Endoscopy;  Laterality: N/A;   ESOPHAGOGASTRODUODENOSCOPY (EGD) WITH PROPOFOL N/A 10/25/2018   Procedure: ESOPHAGOGASTRODUODENOSCOPY (EGD) WITH PROPOFOL;  Surgeon: [name], MD;  Location: [facility] ENDOSCOPY;  Service: Endoscopy;  Laterality: N/A;   ESOPHAGOGASTRODUODENOSCOPY (EGD) WITH PROPOFOL N/A 11/29/2019   Procedure: ESOPHAGOGASTRODUODENOSCOPY (EGD) WITH PROPOFOL;  Surgeon: Toledo, Benay Pike, MD;  Location: [facility] ENDOSCOPY;  Service: Gastroenterology;  Laterality: N/A;   HERNIA REPAIR     umbilical   HIATAL HERNIA REPAIR     I & D EXTREMITY Left 06/27/2015   Procedure: IRRIGATION AND DEBRIDEMENT EXTREMITY            ( CALF HEMATOMA ) POSSIBLE WOUND VAC;  Surgeon: [name], MD;  Location: [facility];  Service: Vascular;  Laterality: Left;   INCISION AND DRAINAGE OF WOUND Left 09/25/2016   Procedure: IRRIGATION AND DEBRIDEMENT - PARTIAL RESECTION OF ACHILLES TENDON WITH WOUND VAC APPLICATION;  Surgeon: [name], DPM;  Location: [facility];  Service: Podiatry;  Laterality: Left;   INCISION AND DRAINAGE OF WOUND Left 11/09/2018   Procedure: Excision of left foot wound with ACell placement;  Surgeon: Wallace Going, DO;  Location: [facility];  Service: Plastics;  Laterality: Left;   IRRIGATION AND DEBRIDEMENT ABSCESS Left 09/07/2016   Procedure: IRRIGATION AND DEBRIDEMENT ABSCESS LEFT HEEL;  Surgeon: [name], DPM;  Location: [facility];  Service: Podiatry;  Laterality: Left;   JOINT REPLACEMENT  2013   left knee replacement   KIDNEY SURGERY Right    kidney stones   LITHOTRIPSY     LOWER EXTREMITY ANGIOGRAPHY Left 04/04/2018   Procedure: LOWER  EXTREMITY ANGIOGRAPHY;  Surgeon: [name], MD;  Location: [facility];  Service: Cardiovascular;  Laterality: Left;   NM GATED MYOVIEW [facility] HX)  February 2017   Likely breast attenuation. LOW RISK study. Normal EF 55-60%.   OTHER SURGICAL HISTORY     08/2016 or 09/2016 surgery on achilles tenso h/o staph infection heal. Dr. [name]   removal of left hematoma Left    leg   REVERSE SHOULDER ARTHROPLASTY Right 10/08/2015   Procedure: REVERSE SHOULDER ARTHROPLASTY;  Surgeon: [name], MD;  Location: [facility];  Service: Orthopedics;  Laterality: Right;   SLEEVE GASTROPLASTY     TONSILLECTOMY     TOTAL KNEE ARTHROPLASTY Left    TOTAL SHOULDER  REPLACEMENT Right    TRANSESOPHAGEAL ECHOCARDIOGRAM  08/08/2013   Mild LVH, EF 60-65%. Moderate LA dilation and mild RA dilation. Mild MR with no evidence of stenosis and no evidence of endocarditis. A false chordae is noted.   TRANSTHORACIC ECHOCARDIOGRAM  08/03/2013   Mild-Moderate concentric LVH, EF 60-65%. Normal diastolic function. Mild LA dilation. Mild MR with possible vegetation  - not confirmed on TEE    ULNAR NERVE TRANSPOSITION Right 08/06/2016   Procedure: ULNAR NERVE DECOMPRESSION/TRANSPOSITION;  Surgeon: [name], MD;  Location: [facility];  Service: Orthopedics;  Laterality: Right;   UPPER GI ENDOSCOPY     VAGINAL HYSTERECTOMY     Family History  Problem Relation Age of Onset   Skin cancer Father    Diabetes Father    Hypertension Father    Peripheral vascular disease Father    Cancer Father    Cerebral aneurysm Father    Alcohol abuse Father    Varicose Veins Mother    Kidney disease Mother    Arthritis Mother    Mental illness Sister    Cancer Maternal Aunt        breast   Breast cancer Maternal Aunt    Arthritis Maternal Grandmother    Hypertension Maternal Grandmother    Diabetes Maternal Grandmother    Arthritis Maternal Grandfather    Heart disease Maternal Grandfather    Hypertension Maternal  Grandfather    Arthritis Paternal Grandmother    Hypertension Paternal Grandmother    Diabetes Paternal Grandmother    Arthritis Paternal Grandfather    Hypertension Paternal Grandfather    Bladder Cancer Neg Hx    Kidney cancer Neg Hx    Social History   Socioeconomic History   Marital status: Married    Spouse name: Not on file   Number of children: Not on file   Years of education: Not on file   Highest education level: Not on file  Occupational History   Not on file  Tobacco Use   Smoking status: Never   Smokeless tobacco: Never  Vaping Use   Vaping Use: Never used  Substance and Sexual Activity   Alcohol use: No    Alcohol/week: 0.0 standard drinks   Drug use: Never   Sexual activity: Not Currently  Other Topics Concern   Not on file  Social History Narrative   She is currently married -- for 30+ years. Does not work. Does not smoke or take alcohol. She never smoked. She exercises at least 3 days a week since before her gastric surgery.   Marital status reviewed in history of present illness.   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   albuterol (PROVENTIL) (2.5 MG/3ML) 0.083% nebulizer solution Take 3 mLs (2.5 mg total) by nebulization every 4 (four) hours as needed for wheezing or shortness of breath.   ALPRAZolam (XANAX) 0.5 MG tablet TAKE ONE-HALF TABLET BY MOUTH  TWICE DAILY AS NEEDED FOR ANXIETY   apixaban (ELIQUIS) 5 MG TABS tablet TAKE 1 TABLET BY  MOUTH TWICE DAILY   benzonatate (TESSALON) 200 MG capsule Take 1 capsule (200 mg total) by mouth 3 (three) times daily as needed for cough.   buPROPion (WELLBUTRIN XL) 150 MG 24 hr tablet Take 1 tablet (150 mg total) by mouth daily.   calcium-vitamin D (OSCAL WITH D) 500-200 MG-UNIT tablet Take 1 tablet by mouth 2 (two) times daily with a meal.   cholecalciferol (VITAMIN D) 25 MCG tablet Take 1 tablet (1,000 Units total) by mouth 2 (two) times daily with a meal.   clobetasol cream (TEMOVATE) 6.38 % Apply 1 application topically 2 (two) times daily as needed (skin irritation).    colchicine 0.6 MG tablet Take 1 tablet (0.6 mg total) by mouth as needed. May repeat 1 pill in 1 hr. No more than 3 total pills 1.8 mg in 24 hours as needed gout flare   collagenase (SANTYL) ointment Apply 1 application topically at bedtime. X7-10 days   diclofenac Sodium (VOLTAREN) 1 % GEL Apply 2 g topically 3 (three) times daily.   DULoxetine (CYMBALTA) 60 MG capsule Take 1 capsule (60 mg total) by mouth daily.   ergocalciferol (VITAMIN D2) 1.25 MG (50000 UT) capsule Take 50,000 Units by mouth once a week.   estradiol (ESTRACE) 0.1 MG/GM vaginal cream Place 1 Applicatorful vaginally every Monday, Wednesday, and Friday. Apply 0.$RemoveBeforeD'5mg'pcgyyjVBoOfYEs$  (pea-sized amount)  just inside the vaginal introitus with a finger-tip on Monday, Wednesday and Friday nights,   fluconazole (DIFLUCAN) 150 MG tablet Take by mouth daily.   fluconazole (DIFLUCAN) 150 MG tablet Take 1 tablet (150 mg total) by mouth daily.   fluconazole (DIFLUCAN) 150 MG tablet Take 1 tablet (150 mg total) by mouth once as needed for up to 1 dose.   Fluticasone-Umeclidin-Vilant (TRELEGY ELLIPTA) 200-62.5-25 MCG/ACT AEPB Inhale 1 puff into the lungs daily.   folic acid (FOLVITE) 1 MG tablet TAKE 1 TABLET BY MOUTH DAILY   furosemide (LASIX) 20 MG tablet TAKE ONE TABLET BY MOUTH EVERY DAY   HYDROcodone-acetaminophen (NORCO/VICODIN) 5-325 MG tablet Take 1 tablet by mouth 2 (two)  times daily as needed for severe pain. Must last 30 days   [START ON 06/11/2021] HYDROcodone-acetaminophen (NORCO/VICODIN) 5-325 MG tablet Take 1 tablet by mouth 2 (two) times daily as needed for severe pain. Must last 30 days   [START ON 07/11/2021] HYDROcodone-acetaminophen (NORCO/VICODIN) 5-325 MG tablet Take 1 tablet by mouth 2 (two) times daily as needed for severe pain. Must last 30 days   levocetirizine (XYZAL) 5 MG tablet Take 1 tablet (5 mg total) by mouth at bedtime as needed for allergies.   levothyroxine (SYNTHROID) 150 MCG tablet Take 1 tablet (150 mcg total) by mouth daily before breakfast. D/c prior sig   losartan (COZAAR) 50 MG tablet Take 1 tablet (50 mg total) by mouth daily. D/c losartan 50-12.5   metFORMIN (GLUCOPHAGE) 500 MG tablet Take 1 tablet (500 mg total) by mouth daily with breakfast. With food   metoprolol succinate (TOPROL-XL) 25 MG 24 hr tablet TAKE 1/2 TABLET BY MOUTH EVERY DAY   Misc. Devices (TUB TRANSFER BOARD) MISC 1 Device by Does not apply route as needed.   montelukast (SINGULAIR) 10 MG tablet Take 1 tablet (10 mg total) by mouth at bedtime.   Multiple Vitamin (MULTIVITAMIN WITH MINERALS) TABS tablet Take 1 tablet by mouth daily.   mupirocin ointment (BACTROBAN) 2 % Apply 1 application topically 2 (  two) times daily. Right lower leg   ondansetron (ZOFRAN) 4 MG tablet Take 1 tablet (4 mg total) by mouth every 8 (eight) hours as needed for nausea or vomiting.   pantoprazole (PROTONIX) 40 MG tablet Take 1 tablet (40 mg total) by mouth daily. 30 min before food   pregabalin (LYRICA) 50 MG capsule TAKE 1 CAPSULE (50 MG) BY MOUTH 3 TIMES DAILY   rosuvastatin (CRESTOR) 20 MG tablet TAKE 1 TABLET BY  MOUTH ONCE DAILY   sitaGLIPtin (JANUVIA) 50 MG tablet Take 1 tablet (50 mg total) by mouth daily.   sucralfate (CARAFATE) 1 GM/10ML suspension Take 1 g by mouth 3 (three) times daily.    thiamine 100 MG tablet Take 0.5 tablets (50 mg total) by mouth 2 (two) times daily with a  meal.   vitamin B-12 1000 MCG tablet Take 1 tablet (1,000 mcg total) by mouth daily.   Allergies  Allergen Reactions   Morphine And Related Other (See Comments) and Nausea Only    Patient becomes very confused Other reaction(s): Other (See Comments), Other (See Comments) Patient becomes very confused Patient becomes very confused    Penicillins Hives, Shortness Of Breath, Swelling and Other (See Comments)    Facial swelling Has patient had a PCN reaction causing immediate rash, facial/tongue/throat swelling, SOB or lightheadedness with hypotension: Yes Has patient had a PCN reaction causing severe rash involving mucus membranes or skin necrosis: Yes Has patient had a PCN reaction that required hospitalization No Has patient had a PCN reaction occurring within the last 10 years: No If all of the above answers are "NO", then may proceed with Cephalosporin use.  Other reaction(s): Other (See Comments), Other (See Comments) Facial swelling Has patient had a PCN reaction causing immediate rash, facial/tongue/throat swelling, SOB or lightheadedness with hypotension: Yes Has patient had a PCN reaction causing severe rash involving mucus membranes or skin necrosis: Yes Has patient had a PCN reaction that required hospitalization No Has patient had a PCN reaction occurring within the last 10 years: No If all of the above answers are "NO", then may proceed with Cephalosporin use. Facial swelling Has patient had a PCN reaction causing immediate rash, facial/tongue/throat swelling, SOB or lightheadedness with hypotension: Yes Has patient had a PCN reaction causing severe rash involving mucus membranes or skin necrosis: Yes Has patient had a PCN reaction that required hospitalization No Has patient had a PCN reaction occurring within the last 10 years: No If all of the above answers are "NO", then may proceed with Cephalosporin use. Facial swelling Has patient had a PCN reaction causing immediate  rash, facial/tongue/throat swelling, SOB or lightheadedness with hypotension: Yes Has patient had a PCN reaction causing severe rash involving mucus membranes or skin necrosis: Yes Has patient had a PCN reaction that required hospitalization No Has patient had a PCN reaction occurring within the last 10 years: No If all of the above answers are "NO", then may proceed with Cephalosporin use.    Ambien [Zolpidem]     Hallucination    Aspirin Hives   Oxytetracycline Hives    Other reaction(s): Unknown    Recent Results (from the past 2160 hour(s))  TSH     Status: Abnormal   Collection Time: 03/25/21  3:04 PM  Result Value Ref Range   TSH 0.142 (L) 0.350 - 4.500 uIU/mL    Comment: Performed by a 3rd Generation assay with a functional sensitivity of <=0.01 uIU/mL. Performed at [facility], [address] [place],  [place] [address]   Haptoglobin     Status: None   Collection Time: 03/25/21  3:04 PM  Result Value Ref Range   Haptoglobin 227 42 - 346 mg/dL    Comment: (NOTE) Performed At: [facility] [place], [place] [id number] Rush Farmer MD [id number]   Multiple Myeloma Panel (SPEP&IFE w/QIG)     Status: None   Collection Time: 03/25/21  3:04 PM  Result Value Ref Range   IgG (Immunoglobin G), Serum 848 586 - 1,602 mg/dL   IgA 118 64 - 422 mg/dL   IgM (Immunoglobulin M), Srm 117 26 - 217 mg/dL   Total Protein ELP 6.1 6.0 - 8.5 g/dL   Albumin SerPl Elph-Mcnc 3.2 2.9 - 4.4 g/dL   Alpha 1 0.3 0.0 - 0.4 g/dL   Alpha2 Glob SerPl Elph-Mcnc 0.9 0.4 - 1.0 g/dL   B-Globulin SerPl Elph-Mcnc 0.9 0.7 - 1.3 g/dL   Gamma Glob SerPl Elph-Mcnc 0.8 0.4 - 1.8 g/dL   M Protein SerPl Elph-Mcnc Not Observed Not Observed g/dL   Globulin, Total 2.9 2.2 - 3.9 g/dL   Albumin/Glob SerPl 1.2 0.7 - 1.7   IFE 1 Comment     Comment: (NOTE) The immunofixation pattern appears unremarkable. Evidence of monoclonal protein is not apparent.    Please Note Comment      Comment: (NOTE) Protein electrophoresis scan will follow via computer, mail, or courier delivery. Performed At: [facility] [place], [place] [id number] Rush Farmer MD [id number]   Ferritin     Status: Abnormal   Collection Time: 03/25/21  3:04 PM  Result Value Ref Range   Ferritin 6 (L) 11 - 307 ng/mL    Comment: Performed at [facility], [address] [place], [place] [address]  Folate     Status: None   Collection Time: 03/25/21  3:04 PM  Result Value Ref Range   Folate 46.0 >5.9 ng/mL    Comment: Performed at [facility], [address] [place], [place] [address]  Comprehensive metabolic panel     Status: Abnormal   Collection Time: 03/25/21  3:04 PM  Result Value Ref Range   Sodium 137 135 - 145 mmol/L   Potassium 4.7 3.5 - 5.1 mmol/L   Chloride 99 98 - 111 mmol/L   CO2 28 22 - 32 mmol/L   Glucose, Bld 154 (H) 70 - 99 mg/dL    Comment: Glucose reference range applies only to samples taken after fasting for at least 8 hours.   BUN 23 8 - 23 mg/dL   Creatinine, Ser 1.19 (H) 0.44 - 1.00 mg/dL   Calcium 8.5 (L) 8.9 - 10.3 mg/dL   Total Protein 6.6 6.5 - 8.1 g/dL   Albumin 3.2 (L) 3.5 - 5.0 g/dL   AST 14 (L) 15 - 41 U/L   ALT 9 0 - 44 U/L   Alkaline Phosphatase 94 38 - 126 U/L   Total Bilirubin 0.3 0.3 - 1.2 mg/dL   GFR, Estimated 49 (L) >60 mL/min    Comment: (NOTE) Calculated using the CKD-EPI Creatinine Equation (2021)    Anion gap 10 5 - 15    Comment: Performed at [facility], [address] [place],  [address]  CBC with Differential/Platelet     Status: Abnormal   Collection Time: 03/25/21  3:04 PM  Result Value Ref Range   WBC 7.6 4.0 - 10.5 K/uL   RBC 3.79 (L) 3.87 - 5.11 MIL/uL  Hemoglobin 8.6 (L) 12.0 - 15.0 g/dL    Comment: Reticulocyte Hemoglobin testing may be clinically indicated, consider ordering this additional test [id number]    HCT 29.6 (L) 36.0 - 46.0 %   MCV 78.1 (L) 80.0 -  100.0 fL   MCH 22.7 (L) 26.0 - 34.0 pg   MCHC 29.1 (L) 30.0 - 36.0 g/dL   RDW 17.8 (H) 11.5 - 15.5 %   Platelets 239 150 - 400 K/uL   nRBC 0.0 0.0 - 0.2 %   Neutrophils Relative % 73 %   Neutro Abs 5.6 1.7 - 7.7 K/uL   Lymphocytes Relative 13 %   Lymphs Abs 1.0 0.7 - 4.0 K/uL   Monocytes Relative 8 %   Monocytes Absolute 0.6 0.1 - 1.0 K/uL   Eosinophils Relative 4 %   Eosinophils Absolute 0.3 0.0 - 0.5 K/uL   Basophils Relative 1 %   Basophils Absolute 0.0 0.0 - 0.1 K/uL   Immature Granulocytes 1 %   Abs Immature Granulocytes 0.08 (H) 0.00 - 0.07 K/uL    Comment: Performed at [facility], [address] [place], [place] [address]  Reticulocytes     Status: Abnormal   Collection Time: 03/25/21  3:05 PM  Result Value Ref Range   Retic Ct Pct 2.0 0.4 - 3.1 %   RBC. 3.72 (L) 3.87 - 5.11 MIL/uL   Retic Count, Absolute 74.4 19.0 - 186.0 K/uL   Immature Retic Fract 35.7 (H) 2.3 - 15.9 %    Comment: Performed at [facility], [address] [place], [place] [address]  Vitamin B12     Status: None   Collection Time: 03/25/21  3:05 PM  Result Value Ref Range   Vitamin B-12 563 180 - 914 pg/mL    Comment: (NOTE) This assay is not validated for testing neonatal or myeloproliferative syndrome specimens for Vitamin B12 levels. Performed at [facility], [place] [address] [place], [place] [address]   B Nat Peptide     Status: Abnormal   Collection Time: 04/11/21  9:38 AM  Result Value Ref Range   BNP 225.6 (H) 0.0 - 100.0 pg/mL    Comment: Siemens ADVIA Centaur XP methodology  Urine Culture     Status: Abnormal   Collection Time: 05/08/21  9:51 AM   Specimen: Urine, Random  Result Value Ref Range   Specimen Description URINE, RANDOM    Special Requests      NONE Performed at [facility], [place] [address] [place], [place] [address]    Culture (A)     >=100,000 COLONIES/mL KLEBSIELLA PNEUMONIAE >=100,000 COLONIES/mL ENTEROCOCCUS FAECALIS    Report  Status 05/11/2021 FINAL    Organism ID, Bacteria KLEBSIELLA PNEUMONIAE (A)    Organism ID, Bacteria ENTEROCOCCUS FAECALIS (A)       Susceptibility   Enterococcus faecalis - MIC*    AMPICILLIN <=2 SENSITIVE Sensitive     NITROFURANTOIN <=16 SENSITIVE Sensitive     VANCOMYCIN 1 SENSITIVE Sensitive     * >=100,000 COLONIES/mL ENTEROCOCCUS FAECALIS   Klebsiella pneumoniae - MIC*    AMPICILLIN RESISTANT Resistant     CEFAZOLIN <=4 SENSITIVE Sensitive     CEFEPIME <=0.12 SENSITIVE Sensitive     CEFTRIAXONE <=0.25 SENSITIVE Sensitive     CIPROFLOXACIN <=0.25 SENSITIVE Sensitive     GENTAMICIN <=1 SENSITIVE Sensitive     IMIPENEM <=0.25 SENSITIVE Sensitive     NITROFURANTOIN 32 SENSITIVE Sensitive     TRIMETH/SULFA >=320 RESISTANT  Resistant     AMPICILLIN/SULBACTAM 4 SENSITIVE Sensitive     PIP/TAZO <=4 SENSITIVE Sensitive     * >=100,000 COLONIES/mL KLEBSIELLA PNEUMONIAE  POCT Urinalysis Dipstick     Status: Abnormal   Collection Time: 05/08/21  9:54 AM  Result Value Ref Range   Color, UA Yellow    Clarity, UA Cloudy    Glucose, UA Negative Negative   Bilirubin, UA Negative    Ketones, UA Negative    Spec Grav, UA >=1.030 (A) 1.010 - 1.025   Blood, UA Trace- Intact    pH, UA 6.0 5.0 - 8.0   Protein, UA Positive (A) Negative    Comment: $RemoveB'100mg'iNsQQMmT$ /dL   Urobilinogen, UA 0.2 0.2 or 1.0 E.U./dL   Nitrite, UA Positive    Leukocytes, UA Moderate (2+) (A) Negative   Appearance Abnormal    Odor FOUL    Objective  Body mass index is 44.55 kg/m. Wt Readings from Last 3 Encounters:  05/27/21 276 lb (125.2 kg)  05/12/21 261 lb (118.4 kg)  04/29/21 261 lb 3.2 oz (118.5 kg)   Temp Readings from Last 3 Encounters:  05/27/21 (!) 97.3 F (36.3 C) (Oral)  05/13/21 (!) 97 F (36.1 C) (Tympanic)  05/12/21 98.4 F (36.9 C)   BP Readings from Last 3 Encounters:  05/27/21 124/84  05/22/21 (!) 142/55  05/13/21 127/62   Pulse Readings from Last 3 Encounters:  05/27/21 61  05/13/21 (!) 48   05/12/21 (!) 57    Physical Exam Vitals and nursing note reviewed.  Constitutional:      Appearance: Normal appearance. She is well-developed and well-groomed.  HENT:     Head: Normocephalic and atraumatic.  Eyes:     Conjunctiva/sclera: Conjunctivae normal.     Pupils: Pupils are equal, round, and reactive to light.  Cardiovascular:     Rate and Rhythm: Normal rate and regular rhythm.     Heart sounds: Normal heart sounds. No murmur heard. Pulmonary:     Effort: Pulmonary effort is normal.     Breath sounds: Normal breath sounds.  Abdominal:     General: Abdomen is flat. Bowel sounds are normal.     Tenderness: There is abdominal tenderness in the right lower quadrant. There is right CVA tenderness.  Musculoskeletal:        General: No tenderness.  Skin:    General: Skin is warm and dry.  Neurological:     General: No focal deficit present.     Mental Status: She is alert and oriented to person, place, and time. Mental status is at baseline.     Cranial Nerves: Cranial nerves 2-12 are intact.     Gait: Gait is intact.     Comments: In wheelchair   Psychiatric:        Attention and Perception: Attention and perception normal.        Mood and Affect: Mood and affect normal.        Speech: Speech normal.        Behavior: Behavior normal. Behavior is cooperative.        Thought Content: Thought content normal.        Cognition and Memory: Cognition and memory normal.        Judgment: Judgment normal.    Assessment  Plan  Leg edema - Plan: ECHOCARDIOGRAM COMPLETE Weight gain - Plan: ECHOCARDIOGRAM COMPLETE F/u cards   Right flank pain with h/o hematuria/kidney stones and recurrent uti- Plan: CT ABDOMEN PELVIS WO CONTRAST, Ambulatory referral  to [facility] Dr Silvestre Gunner   Vitamin D deficiency  DM (diabetes mellitus) type II, controlled, with peripheral vascular disorder ([place]) with HTN On metformin 500 mg qd and januvia 50 mg qd Hypertension lasix  taking 10 mg due to incontinence issues  on toprol  Xl 25 mg qd and losartan 90 m gqd  Foot exam due in the future   Hypothyroidism, - Plan: TSH On levo 150 mcg qd  Chronic left shoulder pain Chronic shoulder pain (Left) Abnormal MRI 01/2021 f/u [facility] pending US guided steroid injection   Hm Flu shot utd utd pna 23, prevnar  Tdap utd   consider shingrix in future rx prev still not done as of 05/2021 Given Rx twinrix in past to get at pharmacy h/o fatty liver  covid had 3/3 pfizer consider booster   DEXA 10/17/14 normal    Out of age window pap Ob/gyn    mammo neg 08/09/17 negative  -referred previously 08/17/19 negative 08/21/20 normal ordered     05/31/17 [facility] colonoscopy with diverticulosis and polyps multiple tubular and hyperplastic due in 2023   EGD had 10/25/2018 repeat due in 1 year [facility] Dr. Gustavo Lah had egd 11/29/19   Rec healthy diet and exercise   Provider: Dr. Olivia Mackie [name]-[facility] Medicine

## 2021-05-28 ENCOUNTER — Encounter: Payer: Self-pay | Admitting: Internal Medicine

## 2021-05-28 ENCOUNTER — Telehealth: Payer: Self-pay | Admitting: Internal Medicine

## 2021-05-28 LAB — LIPID PANEL
Cholesterol: 88 mg/dL (ref 0–200)
HDL: 55.5 mg/dL (ref >39.00)
LDL Cholesterol: 18 mg/dL (ref 0–99)
NonHDL: 32.19
Total CHOL/HDL Ratio: 2
Triglycerides: 70 mg/dL (ref 0.0–149.0)
VLDL: 14 mg/dL (ref 0.0–40.0)

## 2021-05-28 LAB — HEMOGLOBIN A1C: Hgb A1c MFr Bld: 7.3 % — ABNORMAL HIGH (ref 4.6–6.5)

## 2021-05-28 LAB — TSH: TSH: 0.18 u[IU]/mL — ABNORMAL LOW (ref 0.35–5.50)

## 2021-05-28 MED ORDER — LEVOTHYROXINE SODIUM 137 MCG PO TABS: 137.0000 ug | ORAL_TABLET | Freq: Every day | ORAL | 3 refills | Status: DC

## 2021-05-28 NOTE — Addendum Note (Signed)
Addended by: Orland Mustard on: 05/28/2021 01:38 PM ? ? Modules accepted: Orders ? ?

## 2021-05-28 NOTE — Telephone Encounter (Signed)
Lft pt vm to call ofc to sch Ct and echo. thanks ?

## 2021-05-30 ENCOUNTER — Telehealth: Payer: Self-pay | Admitting: Internal Medicine

## 2021-05-30 ENCOUNTER — Other Ambulatory Visit: Payer: Self-pay | Admitting: Nurse Practitioner

## 2021-05-30 DIAGNOSIS — I480 Paroxysmal atrial fibrillation: Secondary | ICD-10-CM

## 2021-05-30 DIAGNOSIS — N390 Urinary tract infection, site not specified: Secondary | ICD-10-CM | POA: Diagnosis not present

## 2021-05-30 LAB — URINALYSIS, ROUTINE W REFLEX MICROSCOPIC
Bilirubin Urine: NEGATIVE
Hgb urine dipstick: NEGATIVE
Ketones, ur: NEGATIVE
Nitrite: POSITIVE — AB
RBC / HPF: NONE SEEN (ref 0–?)
Specific Gravity, Urine: 1.02 (ref 1.000–1.030)
Total Protein, Urine: 30 — AB
Urine Glucose: NEGATIVE
Urobilinogen, UA: 0.2 (ref 0.0–1.0)
pH: 5.5 (ref 5.0–8.0)

## 2021-05-30 LAB — MICROALBUMIN / CREATININE URINE RATIO
Creatinine,U: 72.2 mg/dL
Microalb Creat Ratio: 19 mg/g (ref 0.0–30.0)
Microalb, Ur: 13.7 mg/dL — ABNORMAL HIGH (ref 0.0–1.9)

## 2021-05-30 NOTE — Telephone Encounter (Signed)
No answer. No voicemail. 

## 2021-05-30 NOTE — Telephone Encounter (Signed)
I do not mange lasix this is [facility] cardiology inform christy RN h/[facility] [phone]  ? ?Per Alyse Low pt gained 2 lbs water and on lasix 20 mg and not helping asking for increase over the weekend to keep her out of the ER needs f/u cardiology as well  ? ? ?

## 2021-05-30 NOTE — Telephone Encounter (Signed)
Asthma uncontrolled with trelegy can your office reach out for appt?  ? ?

## 2021-05-30 NOTE — Telephone Encounter (Signed)
Pam, ? ?Would you pls reach out to Ms. Camerer to eval her wt and edema?  If she confirms two lbs wt gain, I rec increasing lasix to '20mg'$  BID over the next three days and then dropping back down to '20mg'$  daily.  If more than that, or worsening symptoms, you could add her to my clinic schedule this afternoon. ? ?Thanks, ? ?Gerald Stabs ?

## 2021-05-30 NOTE — Telephone Encounter (Signed)
Thank you  ?Call home health nurse Edwena Blow and inform see prior message with her # to inform the patient and pulmonary will reach out to patient ? ? ?Dr. Patsey Berthold thank you ? ?

## 2021-05-30 NOTE — Telephone Encounter (Signed)
Spoke with patient to discuss her weight. She reports today she weight 275 and a year ago she was 240. Her home health nurse came to see her and instructed her to increase her furosemide to 40 mg tomorrow and Sunday and then resume her normal dose on Monday. She prefers to see if this dose change will help first and if not then she will call us back for appointment. She declined scheduling one today because she is tired of going to so many doctors. ? ?Per her reports this all started when she was at wound care appointment and they were upset about her breathing. Her advair was changed to Trelegy. She had appointment with pulmonary but she canceled due to exhaustion of attending so many appointments. She has appointment scheduled for 06/20/21 here in our office with APP and would prefer to keep that appointment for now unless her condition worsens. Advised that I would update provider on her wishes, recommendations from home health nurse, and to please call us back if she has any worsening symptoms. She was agreeable with this plan and had no further questions or concerns at this time.  ?

## 2021-05-30 NOTE — Telephone Encounter (Signed)
I do not think that this is related to asthma.  She is on Trelegy and as needed nebulizers.  She had the same issues with Advair which she was on before.  She has gained weight she is swollen and she has an elevated diaphragm which I think is causing issues.  We saw her on 7 February, she had a follow-up appointment with my nurse practitioner on 1 March which she canceled.  She canceled her cardiology appointment on the 24th.  The next appointment we have for her is upcoming in April per her request.  If she is short of breath she may need to be seen in the emergency room I suspect she may need to be admitted for control of her edema and probably further testing.  She has not yet had the PFTs that I ordered because she also wanted those postponed. ?

## 2021-05-30 NOTE — Telephone Encounter (Signed)
Spoke with patient and she reports 25 pound weight gain over the last 10 days. She states the swelling looks better today in her lower legs. Reports that from knees up and in her abdomen are still swollen. She does admit to NOT taking her pill for 2 days due to incontinence up to 13 times in a day. She has not weighed this morning yet and requested she please do so while on the phone. She then requested I call her back in 10 minutes and she will have that number for me at that time. She was appreciative for the call and advised I would call her back in 10 minutes.  ?

## 2021-06-01 ENCOUNTER — Other Ambulatory Visit: Payer: Self-pay | Admitting: Internal Medicine

## 2021-06-01 DIAGNOSIS — N3000 Acute cystitis without hematuria: Secondary | ICD-10-CM

## 2021-06-01 LAB — URINE CULTURE
MICRO NUMBER:: 13115631
SPECIMEN QUALITY:: ADEQUATE

## 2021-06-01 MED ORDER — CIPROFLOXACIN HCL 500 MG PO TABS: 500.0000 mg | ORAL_TABLET | Freq: Two times a day (BID) | ORAL | 0 refills | Status: AC

## 2021-06-02 ENCOUNTER — Other Ambulatory Visit: Payer: Self-pay | Admitting: Internal Medicine

## 2021-06-02 ENCOUNTER — Ambulatory Visit: Payer: Medicare HMO | Admitting: Internal Medicine

## 2021-06-02 DIAGNOSIS — I4891 Unspecified atrial fibrillation: Secondary | ICD-10-CM

## 2021-06-02 DIAGNOSIS — M86572 Other chronic hematogenous osteomyelitis, left ankle and foot: Secondary | ICD-10-CM | POA: Diagnosis not present

## 2021-06-02 DIAGNOSIS — I10 Essential (primary) hypertension: Secondary | ICD-10-CM

## 2021-06-02 DIAGNOSIS — I152 Hypertension secondary to endocrine disorders: Secondary | ICD-10-CM | POA: Diagnosis not present

## 2021-06-02 DIAGNOSIS — E11621 Type 2 diabetes mellitus with foot ulcer: Secondary | ICD-10-CM | POA: Diagnosis not present

## 2021-06-02 DIAGNOSIS — L97812 Non-pressure chronic ulcer of other part of right lower leg with fat layer exposed: Secondary | ICD-10-CM | POA: Diagnosis not present

## 2021-06-02 DIAGNOSIS — L97522 Non-pressure chronic ulcer of other part of left foot with fat layer exposed: Secondary | ICD-10-CM | POA: Diagnosis not present

## 2021-06-02 DIAGNOSIS — E1159 Type 2 diabetes mellitus with other circulatory complications: Secondary | ICD-10-CM | POA: Diagnosis not present

## 2021-06-02 DIAGNOSIS — I872 Venous insufficiency (chronic) (peripheral): Secondary | ICD-10-CM | POA: Diagnosis not present

## 2021-06-02 DIAGNOSIS — R600 Localized edema: Secondary | ICD-10-CM

## 2021-06-02 DIAGNOSIS — E1152 Type 2 diabetes mellitus with diabetic peripheral angiopathy with gangrene: Secondary | ICD-10-CM | POA: Diagnosis not present

## 2021-06-02 DIAGNOSIS — E1142 Type 2 diabetes mellitus with diabetic polyneuropathy: Secondary | ICD-10-CM | POA: Diagnosis not present

## 2021-06-02 DIAGNOSIS — J449 Chronic obstructive pulmonary disease, unspecified: Secondary | ICD-10-CM | POA: Diagnosis not present

## 2021-06-02 NOTE — Telephone Encounter (Signed)
Message was sent to pulmonary CMA to reach out to Patient.  ?

## 2021-06-03 ENCOUNTER — Other Ambulatory Visit: Payer: Self-pay | Admitting: *Deleted

## 2021-06-03 ENCOUNTER — Ambulatory Visit (INDEPENDENT_AMBULATORY_CARE_PROVIDER_SITE_OTHER): Payer: Medicare HMO

## 2021-06-03 ENCOUNTER — Other Ambulatory Visit: Payer: Self-pay

## 2021-06-03 ENCOUNTER — Telehealth: Payer: Self-pay | Admitting: Cardiovascular Disease

## 2021-06-03 DIAGNOSIS — I10 Essential (primary) hypertension: Secondary | ICD-10-CM

## 2021-06-03 DIAGNOSIS — R600 Localized edema: Secondary | ICD-10-CM

## 2021-06-03 DIAGNOSIS — I4891 Unspecified atrial fibrillation: Secondary | ICD-10-CM | POA: Diagnosis not present

## 2021-06-03 DIAGNOSIS — D508 Other iron deficiency anemias: Secondary | ICD-10-CM

## 2021-06-03 MED ORDER — PERFLUTREN LIPID MICROSPHERE
1.0000 mL | INTRAVENOUS | Status: AC | PRN
  Administered 2021-06-03: 2 mL via INTRAVENOUS

## 2021-06-03 NOTE — Telephone Encounter (Signed)
Pt c/o swelling: STAT is pt has developed SOB within 24 hours  If swelling, where is the swelling located? Knees up to abdominal area  How much weight have you gained and in what time span? 6 weeks has gained 25 pounds  Have you gained 3 pounds in a day or 5 pounds in a week? yes  Do you have a log of your daily weights (if so, list)?  6 weeks started 261 , yesterday 283  Are you currently taking a fluid pill? yes  Are you currently SOB? No,but nurse at wound care noticed she was having some SOB  Have you traveled recently? no

## 2021-06-03 NOTE — Addendum Note (Signed)
Addended by: Orland Mustard on: 06/03/2021 12:30 PM ? ? Modules accepted: Orders ? ?

## 2021-06-03 NOTE — Telephone Encounter (Signed)
Spoke with the patient. ?Patient reports a increased weight gain of 25-30 lbs over the last 6-8 weeks. ?Patient reports increased swelling in her LE up to her knees and abdomen. ?She denies a change in diet or increase of sodium. ?She reports increased fatigue and giving out quicker. ?Last week her Pulmonologist increased her Lasix 40 mg daily. She notes increases urine output and decreased swelling in her ankles. ? ?Patient was seen by wound care and home health yesterday and encouraged to call Cardiology to provide the update. ?Patient has seen Ignacia Bayley, NP in the past. Adv the patient that I will update Gerald Stabs and call back with his recommendation. ?Appt sch with CB on 06/06/21 @ 3:10pm ? ?Spoke with Gerald Stabs. ?Patient should continue Lasix 40 mg daily and f/u as scheduled on 06/06/21. ?Patient is to contact the office sooner if needed. ? ?Patient verbalized understanding and voiced appreciation for the call. ?

## 2021-06-04 ENCOUNTER — Telehealth: Payer: Self-pay | Admitting: Internal Medicine

## 2021-06-04 LAB — ECHOCARDIOGRAM COMPLETE
AR max vel: 2.18 cm2
AV Area VTI: 1.7 cm2
AV Area mean vel: 1.98 cm2
AV Mean grad: 4 mmHg
AV Peak grad: 6.4 mmHg
Ao pk vel: 1.26 m/s
S' Lateral: 2.9 cm

## 2021-06-04 NOTE — Telephone Encounter (Signed)
Patient returned office phone call. Please call her tomorrow, 06/05/2021. She has a 3pm appointment today and will be unable to take referrals phone call. ?

## 2021-06-04 NOTE — Telephone Encounter (Signed)
Lft pt vm to call ofc to sch CT. thanks 

## 2021-06-05 ENCOUNTER — Telehealth: Payer: Self-pay | Admitting: Internal Medicine

## 2021-06-05 ENCOUNTER — Ambulatory Visit: Payer: Medicare HMO | Attending: Neurology

## 2021-06-05 NOTE — Telephone Encounter (Signed)
Lft pt vm on both numbers to call ofc to sch CT. thanks 

## 2021-06-06 ENCOUNTER — Other Ambulatory Visit: Payer: Self-pay

## 2021-06-06 ENCOUNTER — Ambulatory Visit: Payer: Medicare HMO | Admitting: Nurse Practitioner

## 2021-06-06 ENCOUNTER — Encounter: Payer: Self-pay | Admitting: Nurse Practitioner

## 2021-06-06 ENCOUNTER — Emergency Department: Payer: Medicare HMO

## 2021-06-06 ENCOUNTER — Other Ambulatory Visit: Payer: Self-pay | Admitting: Internal Medicine

## 2021-06-06 ENCOUNTER — Observation Stay: Payer: Medicare HMO

## 2021-06-06 ENCOUNTER — Inpatient Hospital Stay
Admission: EM | Admit: 2021-06-06 | Discharge: 2021-06-10 | DRG: 291 | Disposition: A | Discharge status: Home Health | Payer: Medicare HMO | Attending: Family Medicine | Admitting: Family Medicine

## 2021-06-06 VITALS — BP 142/72 | HR 62 | Ht 66.0 in

## 2021-06-06 DIAGNOSIS — R0602 Shortness of breath: Secondary | ICD-10-CM | POA: Diagnosis present

## 2021-06-06 DIAGNOSIS — Z9049 Acquired absence of other specified parts of digestive tract: Secondary | ICD-10-CM

## 2021-06-06 DIAGNOSIS — Z8614 Personal history of Methicillin resistant Staphylococcus aureus infection: Secondary | ICD-10-CM

## 2021-06-06 DIAGNOSIS — I441 Atrioventricular block, second degree: Secondary | ICD-10-CM

## 2021-06-06 DIAGNOSIS — D649 Anemia, unspecified: Secondary | ICD-10-CM | POA: Diagnosis present

## 2021-06-06 DIAGNOSIS — G4733 Obstructive sleep apnea (adult) (pediatric): Secondary | ICD-10-CM | POA: Diagnosis present

## 2021-06-06 DIAGNOSIS — E1151 Type 2 diabetes mellitus with diabetic peripheral angiopathy without gangrene: Secondary | ICD-10-CM | POA: Diagnosis not present

## 2021-06-06 DIAGNOSIS — L03115 Cellulitis of right lower limb: Secondary | ICD-10-CM | POA: Diagnosis not present

## 2021-06-06 DIAGNOSIS — I152 Hypertension secondary to endocrine disorders: Secondary | ICD-10-CM | POA: Diagnosis not present

## 2021-06-06 DIAGNOSIS — E785 Hyperlipidemia, unspecified: Secondary | ICD-10-CM | POA: Diagnosis present

## 2021-06-06 DIAGNOSIS — J449 Chronic obstructive pulmonary disease, unspecified: Secondary | ICD-10-CM | POA: Diagnosis not present

## 2021-06-06 DIAGNOSIS — Z79899 Other long term (current) drug therapy: Secondary | ICD-10-CM

## 2021-06-06 DIAGNOSIS — L0390 Cellulitis, unspecified: Secondary | ICD-10-CM | POA: Diagnosis present

## 2021-06-06 DIAGNOSIS — I480 Paroxysmal atrial fibrillation: Secondary | ICD-10-CM | POA: Diagnosis present

## 2021-06-06 DIAGNOSIS — R001 Bradycardia, unspecified: Secondary | ICD-10-CM | POA: Diagnosis present

## 2021-06-06 DIAGNOSIS — D509 Iron deficiency anemia, unspecified: Secondary | ICD-10-CM | POA: Diagnosis not present

## 2021-06-06 DIAGNOSIS — N183 Chronic kidney disease, stage 3 unspecified: Secondary | ICD-10-CM | POA: Diagnosis not present

## 2021-06-06 DIAGNOSIS — Z8673 Personal history of transient ischemic attack (TIA), and cerebral infarction without residual deficits: Secondary | ICD-10-CM

## 2021-06-06 DIAGNOSIS — Z803 Family history of malignant neoplasm of breast: Secondary | ICD-10-CM

## 2021-06-06 DIAGNOSIS — Z880 Allergy status to penicillin: Secondary | ICD-10-CM

## 2021-06-06 DIAGNOSIS — J9601 Acute respiratory failure with hypoxia: Secondary | ICD-10-CM | POA: Diagnosis present

## 2021-06-06 DIAGNOSIS — L97812 Non-pressure chronic ulcer of other part of right lower leg with fat layer exposed: Secondary | ICD-10-CM | POA: Diagnosis not present

## 2021-06-06 DIAGNOSIS — I10 Essential (primary) hypertension: Secondary | ICD-10-CM | POA: Diagnosis not present

## 2021-06-06 DIAGNOSIS — M1990 Unspecified osteoarthritis, unspecified site: Secondary | ICD-10-CM | POA: Diagnosis present

## 2021-06-06 DIAGNOSIS — N390 Urinary tract infection, site not specified: Secondary | ICD-10-CM | POA: Diagnosis present

## 2021-06-06 DIAGNOSIS — I110 Hypertensive heart disease with heart failure: Principal | ICD-10-CM | POA: Diagnosis present

## 2021-06-06 DIAGNOSIS — Z886 Allergy status to analgesic agent status: Secondary | ICD-10-CM

## 2021-06-06 DIAGNOSIS — I509 Heart failure, unspecified: Principal | ICD-10-CM

## 2021-06-06 DIAGNOSIS — E039 Hypothyroidism, unspecified: Secondary | ICD-10-CM | POA: Diagnosis present

## 2021-06-06 DIAGNOSIS — Z79890 Hormone replacement therapy (postmenopausal): Secondary | ICD-10-CM

## 2021-06-06 DIAGNOSIS — I2721 Secondary pulmonary arterial hypertension: Secondary | ICD-10-CM | POA: Diagnosis present

## 2021-06-06 DIAGNOSIS — F32A Depression, unspecified: Secondary | ICD-10-CM | POA: Diagnosis present

## 2021-06-06 DIAGNOSIS — Z7901 Long term (current) use of anticoagulants: Secondary | ICD-10-CM

## 2021-06-06 DIAGNOSIS — Z888 Allergy status to other drugs, medicaments and biological substances status: Secondary | ICD-10-CM

## 2021-06-06 DIAGNOSIS — E1159 Type 2 diabetes mellitus with other circulatory complications: Secondary | ICD-10-CM | POA: Diagnosis not present

## 2021-06-06 DIAGNOSIS — I482 Chronic atrial fibrillation, unspecified: Secondary | ICD-10-CM | POA: Diagnosis present

## 2021-06-06 DIAGNOSIS — I872 Venous insufficiency (chronic) (peripheral): Secondary | ICD-10-CM | POA: Diagnosis not present

## 2021-06-06 DIAGNOSIS — Z8261 Family history of arthritis: Secondary | ICD-10-CM

## 2021-06-06 DIAGNOSIS — Z6841 Body Mass Index (BMI) 40.0 and over, adult: Secondary | ICD-10-CM

## 2021-06-06 DIAGNOSIS — L97522 Non-pressure chronic ulcer of other part of left foot with fat layer exposed: Secondary | ICD-10-CM | POA: Diagnosis not present

## 2021-06-06 DIAGNOSIS — Z8249 Family history of ischemic heart disease and other diseases of the circulatory system: Secondary | ICD-10-CM

## 2021-06-06 DIAGNOSIS — E1142 Type 2 diabetes mellitus with diabetic polyneuropathy: Secondary | ICD-10-CM | POA: Diagnosis not present

## 2021-06-06 DIAGNOSIS — E11621 Type 2 diabetes mellitus with foot ulcer: Secondary | ICD-10-CM | POA: Diagnosis not present

## 2021-06-06 DIAGNOSIS — Z808 Family history of malignant neoplasm of other organs or systems: Secondary | ICD-10-CM

## 2021-06-06 DIAGNOSIS — Z87442 Personal history of urinary calculi: Secondary | ICD-10-CM

## 2021-06-06 DIAGNOSIS — Z841 Family history of disorders of kidney and ureter: Secondary | ICD-10-CM

## 2021-06-06 DIAGNOSIS — Z7951 Long term (current) use of inhaled steroids: Secondary | ICD-10-CM

## 2021-06-06 DIAGNOSIS — E1152 Type 2 diabetes mellitus with diabetic peripheral angiopathy with gangrene: Secondary | ICD-10-CM | POA: Diagnosis not present

## 2021-06-06 DIAGNOSIS — F419 Anxiety disorder, unspecified: Secondary | ICD-10-CM | POA: Diagnosis present

## 2021-06-06 DIAGNOSIS — Z7984 Long term (current) use of oral hypoglycemic drugs: Secondary | ICD-10-CM

## 2021-06-06 DIAGNOSIS — Z818 Family history of other mental and behavioral disorders: Secondary | ICD-10-CM

## 2021-06-06 DIAGNOSIS — Z9884 Bariatric surgery status: Secondary | ICD-10-CM

## 2021-06-06 DIAGNOSIS — I071 Rheumatic tricuspid insufficiency: Secondary | ICD-10-CM | POA: Diagnosis present

## 2021-06-06 DIAGNOSIS — I5033 Acute on chronic diastolic (congestive) heart failure: Secondary | ICD-10-CM | POA: Diagnosis present

## 2021-06-06 DIAGNOSIS — Z885 Allergy status to narcotic agent status: Secondary | ICD-10-CM

## 2021-06-06 DIAGNOSIS — Z87440 Personal history of urinary (tract) infections: Secondary | ICD-10-CM

## 2021-06-06 DIAGNOSIS — M86572 Other chronic hematogenous osteomyelitis, left ankle and foot: Secondary | ICD-10-CM | POA: Diagnosis not present

## 2021-06-06 DIAGNOSIS — R109 Unspecified abdominal pain: Secondary | ICD-10-CM | POA: Diagnosis not present

## 2021-06-06 DIAGNOSIS — E119 Type 2 diabetes mellitus without complications: Secondary | ICD-10-CM

## 2021-06-06 DIAGNOSIS — Z833 Family history of diabetes mellitus: Secondary | ICD-10-CM

## 2021-06-06 DIAGNOSIS — Z96652 Presence of left artificial knee joint: Secondary | ICD-10-CM | POA: Diagnosis present

## 2021-06-06 DIAGNOSIS — L03116 Cellulitis of left lower limb: Secondary | ICD-10-CM | POA: Diagnosis present

## 2021-06-06 DIAGNOSIS — Z20822 Contact with and (suspected) exposure to covid-19: Secondary | ICD-10-CM | POA: Diagnosis present

## 2021-06-06 DIAGNOSIS — R32 Unspecified urinary incontinence: Secondary | ICD-10-CM | POA: Diagnosis present

## 2021-06-06 DIAGNOSIS — K219 Gastro-esophageal reflux disease without esophagitis: Secondary | ICD-10-CM | POA: Diagnosis present

## 2021-06-06 DIAGNOSIS — I639 Cerebral infarction, unspecified: Secondary | ICD-10-CM | POA: Diagnosis present

## 2021-06-06 DIAGNOSIS — Z86718 Personal history of other venous thrombosis and embolism: Secondary | ICD-10-CM

## 2021-06-06 DIAGNOSIS — Z96611 Presence of right artificial shoulder joint: Secondary | ICD-10-CM | POA: Diagnosis present

## 2021-06-06 DIAGNOSIS — Z90710 Acquired absence of both cervix and uterus: Secondary | ICD-10-CM

## 2021-06-06 LAB — URINALYSIS, ROUTINE W REFLEX MICROSCOPIC
Bilirubin Urine: NEGATIVE
Glucose, UA: NEGATIVE mg/dL
Hgb urine dipstick: NEGATIVE
Ketones, ur: NEGATIVE mg/dL
Leukocytes,Ua: NEGATIVE
Nitrite: NEGATIVE
Protein, ur: NEGATIVE mg/dL
Specific Gravity, Urine: 1.004 — ABNORMAL LOW (ref 1.005–1.030)
pH: 6 (ref 5.0–8.0)

## 2021-06-06 LAB — MAGNESIUM: Magnesium: 2 mg/dL (ref 1.7–2.4)

## 2021-06-06 LAB — CBC
HCT: 35.9 % — ABNORMAL LOW (ref 36.0–46.0)
Hemoglobin: 10.4 g/dL — ABNORMAL LOW (ref 12.0–15.0)
MCH: 24.8 pg — ABNORMAL LOW (ref 26.0–34.0)
MCHC: 29 g/dL — ABNORMAL LOW (ref 30.0–36.0)
MCV: 85.7 fL (ref 80.0–100.0)
Platelets: 183 10*3/uL (ref 150–400)
RBC: 4.19 MIL/uL (ref 3.87–5.11)
RDW: 24.6 % — ABNORMAL HIGH (ref 11.5–15.5)
WBC: 6.7 10*3/uL (ref 4.0–10.5)
nRBC: 0 % (ref 0.0–0.2)

## 2021-06-06 LAB — RESP PANEL BY RT-PCR (FLU A&B, COVID) ARPGX2
Influenza A by PCR: NEGATIVE
Influenza B by PCR: NEGATIVE
SARS Coronavirus 2 by RT PCR: NEGATIVE

## 2021-06-06 LAB — BASIC METABOLIC PANEL
Anion gap: 6 (ref 5–15)
BUN: 23 mg/dL (ref 8–23)
CO2: 31 mmol/L (ref 22–32)
Calcium: 8.5 mg/dL — ABNORMAL LOW (ref 8.9–10.3)
Chloride: 103 mmol/L (ref 98–111)
Creatinine, Ser: 1.15 mg/dL — ABNORMAL HIGH (ref 0.44–1.00)
GFR, Estimated: 51 mL/min — ABNORMAL LOW (ref >60)
Glucose, Bld: 128 mg/dL — ABNORMAL HIGH (ref 70–99)
Potassium: 4.7 mmol/L (ref 3.5–5.1)
Sodium: 140 mmol/L (ref 135–145)

## 2021-06-06 LAB — BRAIN NATRIURETIC PEPTIDE: B Natriuretic Peptide: 241.7 pg/mL — ABNORMAL HIGH (ref 0.0–100.0)

## 2021-06-06 MED ORDER — OXYCODONE HCL 5 MG PO TABS
5.0000 mg | ORAL_TABLET | ORAL | Status: DC | PRN
  Administered 2021-06-07 – 2021-06-10 (×10): 5 mg via ORAL
  Filled 2021-06-06 (×10): qty 1

## 2021-06-06 MED ORDER — INSULIN ASPART 100 UNIT/ML IJ SOLN
0.0000 [IU] | Freq: Three times a day (TID) | INTRAMUSCULAR | Status: DC
  Administered 2021-06-07: 3 [IU] via SUBCUTANEOUS
  Administered 2021-06-07: 5 [IU] via SUBCUTANEOUS
  Administered 2021-06-08 (×2): 3 [IU] via SUBCUTANEOUS
  Administered 2021-06-09: 2 [IU] via SUBCUTANEOUS
  Administered 2021-06-09: 3 [IU] via SUBCUTANEOUS
  Filled 2021-06-06 (×6): qty 1

## 2021-06-06 MED ORDER — ONDANSETRON HCL 4 MG/2ML IJ SOLN
INTRAMUSCULAR | Status: AC
Start: 2021-06-06 — End: 2021-06-06
  Administered 2021-06-06: 4 mg via INTRAVENOUS
  Filled 2021-06-06: qty 2

## 2021-06-06 MED ORDER — ACETAMINOPHEN 325 MG PO TABS
650.0000 mg | ORAL_TABLET | Freq: Four times a day (QID) | ORAL | Status: DC | PRN
  Administered 2021-06-09: 650 mg via ORAL
  Filled 2021-06-06: qty 2

## 2021-06-06 MED ORDER — HYDRALAZINE HCL 20 MG/ML IJ SOLN: 5.0000 mg | INTRAMUSCULAR | Status: DC | PRN

## 2021-06-06 MED ORDER — FUROSEMIDE 10 MG/ML IJ SOLN
20.0000 mg | Freq: Two times a day (BID) | INTRAMUSCULAR | Status: DC
  Administered 2021-06-07: 20 mg via INTRAVENOUS
  Filled 2021-06-06: qty 4

## 2021-06-06 MED ORDER — ONDANSETRON HCL 4 MG PO TABS: 4.0000 mg | ORAL_TABLET | Freq: Four times a day (QID) | ORAL | Status: DC | PRN

## 2021-06-06 MED ORDER — SODIUM CHLORIDE 0.9% FLUSH
3.0000 mL | Freq: Two times a day (BID) | INTRAVENOUS | Status: DC
  Administered 2021-06-07 – 2021-06-10 (×7): 3 mL via INTRAVENOUS

## 2021-06-06 MED ORDER — ACETAMINOPHEN 650 MG RE SUPP: 650.0000 mg | Freq: Four times a day (QID) | RECTAL | Status: DC | PRN

## 2021-06-06 MED ORDER — VANCOMYCIN HCL 1500 MG/300ML IV SOLN
1500.0000 mg | Freq: Once | INTRAVENOUS | Status: AC
  Administered 2021-06-06: 1500 mg via INTRAVENOUS
  Filled 2021-06-06: qty 300

## 2021-06-06 MED ORDER — FUROSEMIDE 10 MG/ML IJ SOLN
80.0000 mg | Freq: Once | INTRAMUSCULAR | Status: AC
  Administered 2021-06-06: 80 mg via INTRAVENOUS
  Filled 2021-06-06: qty 8

## 2021-06-06 MED ORDER — MORPHINE SULFATE (PF) 2 MG/ML IV SOLN
2.0000 mg | Freq: Once | INTRAVENOUS | Status: AC
  Administered 2021-06-06: 2 mg via INTRAVENOUS
  Filled 2021-06-06: qty 1

## 2021-06-06 MED ORDER — VANCOMYCIN HCL IN DEXTROSE 1-5 GM/200ML-% IV SOLN: 1000.0000 mg | Freq: Once | INTRAVENOUS | Status: DC

## 2021-06-06 MED ORDER — ONDANSETRON HCL 4 MG/2ML IJ SOLN: 4.0000 mg | Freq: Four times a day (QID) | INTRAMUSCULAR | Status: DC | PRN

## 2021-06-06 MED ORDER — VANCOMYCIN HCL IN DEXTROSE 1-5 GM/200ML-% IV SOLN
1000.0000 mg | Freq: Once | INTRAVENOUS | Status: AC
  Administered 2021-06-06: 1000 mg via INTRAVENOUS
  Filled 2021-06-06: qty 200

## 2021-06-06 NOTE — ED Triage Notes (Signed)
Pt to ED from [facility]. Pt reports [facility] was concerned about reading of echo and sent over for diureses. Pt reports increased swelling in lower extremities and CP over the past several months. Pt reports 35lb weight gain. Pt currently on 4th week of antibiotic for UTI.  ?

## 2021-06-06 NOTE — Consult Note (Signed)
PHARMACY -  BRIEF ANTIBIOTIC NOTE  ? ?Pharmacy has received consult(s) for Vancomycin from an ED provider.  The patient's profile has been reviewed for ht/wt/allergies/indication/available labs.   ? ?One time order(s) placed for: ?Vancomycin 2.5g IV (1g+1.5g) x1 in ED for initial loading dose. ? ?Further antibiotics/pharmacy consults should be ordered by admitting physician if indicated.       ?                ?Thank you, ?Shanon Brow Aleksey Newbern ?06/06/2021  7:55 PM ? ?

## 2021-06-06 NOTE — ED Notes (Signed)
Pt cleaned of urine, linens changed, new purewick applied. Pt repositioned for comfort. ?

## 2021-06-06 NOTE — Progress Notes (Signed)
? ?Cardiology Clinic Note  ? ?Patient Name: [name] ?Date of Encounter: 06/06/2021 ? ?Primary Care Provider:  [name], [name], MD ?Primary Cardiologist:  Kathlyn Sacramento, MD ? ?Patient Profile  ?  ?71 year old female with a history of paroxysmal atrial fibrillation, prior strokes, hypertension, hyperlipidemia, diabetes, obesity, chronic pain, DVT, sleep apnea on CPAP, and obesity, who presents for follow-up related to acute on chronic HFpEF. ? ?Past Medical History  ?  ?Past Medical History:  ?Diagnosis Date  ? Abnormal antibody titer   ? Anxiety and depression   ? Arthritis   ? Asthma   ? Atypical chest pain   ? a. 03/2018 MV: No ischemia/infarct. EF >65%.  ? Carotid arterial disease ([place])   ? a. 06/7423 U/S: RICA <95, LICA nl.  ? Chronic heart failure with preserved ejection fraction (HFpEF) ([place])   ? a. 08/2013 Echo: EF 60-65%; b. 08/2019 Echo: EF 60-65%; c. 03/2020 Echo: EF 60-65%; d. 05/2021 Echo: EF 55-60%, no rmwa, nl RV fxn, RVSP 54.73mHg. Mildly dil LA, mild MR, mod TR.  ? Cystocele   ? Depression   ? Diabetes ([place]   ? DVT (deep venous thrombosis) ([place]   ? Righ calf  ? Dyspnea   ? 11/08/2018 - "more now due to lack of exercise"  ? Edema   ? GERD (gastroesophageal reflux disease)   ? Heart murmur   ? a. 08/2013 Echo: EF 60-65%, Mild MR (poss veg seen on TTE, not seen on TEE), nl LV size with mod conc LVH. Mild LAE; b. 08/2019 Echo: EF 60-65%, no rwma. Nl RV size/fxn. No significant valvular dzs.  ? History of IBS   ? History of kidney stones   ? History of methicillin resistant staphylococcus aureus (MRSA) 2008  ? History of multiple strokes   ? Hyperlipidemia   ? Hypertension   ? Hypothyroidism   ? Insomnia   ? Kidney stone   ? kidney stones with lithotripsy .  [facility]- "adrenal glands"  ? Mitral regurgitation   ? a. 05/2021 Echo: Mild MR.  ? Neuropathy involving both lower extremities   ? Non-healing ulcer of left foot ([place]   ? Obesity, Class II, BMI 35-39.9, with comorbidity   ? PAF  (paroxysmal atrial fibrillation) ([place]   ? a. 07/2013 Event monitor: Freq PVCs and 3 short runs of Afib-->CHA2DS2VASc = 7-->Eliquis.  ? PAH (pulmonary artery hypertension) ([place]   ? a. 05/2021 Echo: RVSP 54.431mg.  ? Peripheral vascular disease ([place]  ? a. 03/2018 LE Angio: Nl renal arteries. Nl aorta & iliacs. Nl LLE arterial flow; b. 06/2020 ABI's:  ? Sleep apnea   ? use C-PAP  ? [facility] ([facility]  ? 03/2020  ? Tricuspid regurgitation   ? a. 05/2021 Echo: Mod TR (in setting of RVSP 54.39m72m).  ? Urinary incontinence   ? Venous stasis dermatitis of both lower extremities   ? ?Past Surgical History:  ?Procedure Laterality Date  ? ANKLE SURGERY Right   ? APPLICATION OF WOUND VAC Left 06/27/2015  ? Procedure: APPLICATION OF WOUND VAC ( POSSIBLE ) ;  Surgeon: JasAlgernon HuxleyD;  Location: [facility] ORS;  Service: Vascular;  Laterality: Left;  ? APPLICATION OF WOUND VAC Left 09/25/2016  ? Procedure: APPLICATION OF WOUND VAC;  Surgeon: TroAlbertine PatriciaPM;  Location: [facility] ORS;  Service: Podiatry;  Laterality: Left;  ? arm surgery    ? right    ? CHOLECYSTECTOMY    ? COLONOSCOPY    ?  COLONOSCOPY WITH PROPOFOL N/A 01/11/2017  ? Procedure: COLONOSCOPY WITH PROPOFOL;  Surgeon: [name], MD;  Location: [facility] ENDOSCOPY;  Service: Endoscopy;  Laterality: N/A;  ? COLONOSCOPY WITH PROPOFOL N/A 02/22/2017  ? Procedure: COLONOSCOPY WITH PROPOFOL;  Surgeon: [name], MD;  Location: [facility] ENDOSCOPY;  Service: Endoscopy;  Laterality: N/A;  ? COLONOSCOPY WITH PROPOFOL N/A 05/31/2017  ? Procedure: COLONOSCOPY WITH PROPOFOL;  Surgeon: [name], MD;  Location: [facility] ENDOSCOPY;  Service: Endoscopy;  Laterality: N/A;  ? ESOPHAGOGASTRODUODENOSCOPY (EGD) WITH PROPOFOL N/A 01/11/2017  ? Procedure: ESOPHAGOGASTRODUODENOSCOPY (EGD) WITH PROPOFOL;  Surgeon: [name], MD;  Location: [facility] ENDOSCOPY;  Service: Endoscopy;  Laterality: N/A;  ? ESOPHAGOGASTRODUODENOSCOPY (EGD) WITH PROPOFOL N/A 10/25/2018  ? Procedure:  ESOPHAGOGASTRODUODENOSCOPY (EGD) WITH PROPOFOL;  Surgeon: [name], MD;  Location: [facility] ENDOSCOPY;  Service: Endoscopy;  Laterality: N/A;  ? ESOPHAGOGASTRODUODENOSCOPY (EGD) WITH PROPOFOL N/A 11/29/2019  ? Procedure: ESOPHAGOGASTRODUODENOSCOPY (EGD) WITH PROPOFOL;  Surgeon: Toledo, Benay Pike, MD;  Location: [facility] ENDOSCOPY;  Service: Gastroenterology;  Laterality: N/A;  ? HERNIA REPAIR    ? umbilical  ? HIATAL HERNIA REPAIR    ? I & D EXTREMITY Left 06/27/2015  ? Procedure: IRRIGATION AND DEBRIDEMENT EXTREMITY            ( CALF HEMATOMA ) POSSIBLE WOUND VAC;  Surgeon: [name], MD;  Location: [facility] ORS;  Service: Vascular;  Laterality: Left;  ? INCISION AND DRAINAGE OF WOUND Left 09/25/2016  ? Procedure: IRRIGATION AND DEBRIDEMENT - PARTIAL RESECTION OF ACHILLES TENDON WITH WOUND VAC APPLICATION;  Surgeon: [name], DPM;  Location: [facility] ORS;  Service: Podiatry;  Laterality: Left;  ? INCISION AND DRAINAGE OF WOUND Left 11/09/2018  ? Procedure: Excision of left foot wound with ACell placement;  Surgeon: Wallace Going, DO;  Location: [facility];  Service: Plastics;  Laterality: Left;  ? IRRIGATION AND DEBRIDEMENT ABSCESS Left 09/07/2016  ? Procedure: IRRIGATION AND DEBRIDEMENT ABSCESS LEFT HEEL;  Surgeon: [name], DPM;  Location: [facility] ORS;  Service: Podiatry;  Laterality: Left;  ? JOINT REPLACEMENT  2013  ? left knee replacement  ? KIDNEY SURGERY Right   ? kidney stones  ? LITHOTRIPSY    ? LOWER EXTREMITY ANGIOGRAPHY Left 04/04/2018  ? Procedure: LOWER EXTREMITY ANGIOGRAPHY;  Surgeon: [name], MD;  Location: [facility] CV LAB;  Service: Cardiovascular;  Laterality: Left;  ? NM [facility] HX)  February 2017  ? Likely breast attenuation. LOW RISK study. Normal EF 55-60%.  ? OTHER SURGICAL HISTORY    ? 08/2016 or 09/2016 surgery on achilles tenso h/o staph infection heal. Dr. [name]  ? removal of left hematoma Left   ? leg  ? REVERSE SHOULDER ARTHROPLASTY Right 10/08/2015  ? Procedure:  REVERSE SHOULDER ARTHROPLASTY;  Surgeon: [name], MD;  Location: [facility] ORS;  Service: Orthopedics;  Laterality: Right;  ? SLEEVE GASTROPLASTY    ? TONSILLECTOMY    ? TOTAL KNEE ARTHROPLASTY Left   ? TOTAL SHOULDER REPLACEMENT Right   ? TRANSESOPHAGEAL ECHOCARDIOGRAM  08/08/2013  ? Mild LVH, EF 60-65%. Moderate LA dilation and mild RA dilation. Mild MR with no evidence of stenosis and no evidence of endocarditis. A false chordae is noted.  ? TRANSTHORACIC ECHOCARDIOGRAM  08/03/2013  ? Mild-Moderate concentric LVH, EF 60-65%. Normal diastolic function. Mild LA dilation. Mild MR with possible vegetation  - not confirmed on TEE   ? ULNAR NERVE TRANSPOSITION Right 08/06/2016  ? Procedure: ULNAR NERVE DECOMPRESSION/TRANSPOSITION;  Surgeon:  Poggi, Marshall Cork, MD;  Location: [facility] ORS;  Service: Orthopedics;  Laterality: Right;  ? UPPER GI ENDOSCOPY    ? VAGINAL HYSTERECTOMY    ? ? ?Allergies ? ?Allergies  ?Allergen Reactions  ? Morphine And Related Other (See Comments) and Nausea Only  ?  Patient becomes very confused ?Other reaction(s): Other (See Comments), Other (See Comments) ?Patient becomes very confused ?Patient becomes very confused ?  ? Penicillins Hives, Shortness Of Breath, Swelling and Other (See Comments)  ?  Facial swelling ?Has patient had a PCN reaction causing immediate rash, facial/tongue/throat swelling, SOB or lightheadedness with hypotension: Yes ?Has patient had a PCN reaction causing severe rash involving mucus membranes or skin necrosis: Yes ?Has patient had a PCN reaction that required hospitalization No ?Has patient had a PCN reaction occurring within the last 10 years: No ?If all of the above answers are "NO", then may proceed with Cephalosporin use. ? ?Other reaction(s): Other (See Comments), Other (See Comments) ?Facial swelling ?Has patient had a PCN reaction causing immediate rash, facial/tongue/throat swelling, SOB or lightheadedness with hypotension: Yes ?Has patient had a PCN reaction  causing severe rash involving mucus membranes or skin necrosis: Yes ?Has patient had a PCN reaction that required hospitalization No ?Has patient had a PCN reaction occurring within the last 10 years: No ?If all of the above ans

## 2021-06-06 NOTE — ED Notes (Signed)
Pt taken to CT.

## 2021-06-06 NOTE — ED Notes (Signed)
Pt placed on purewick 

## 2021-06-06 NOTE — H&P (Addendum)
?History and Physical  ? ? ?Patient: [name]:[id number] DOB: 14-Apr-1950 ?DOA: 06/06/2021 ?DOS: the patient was seen and examined on 06/07/2021 ?PCP: [name], [name], MD  ?Patient coming from: Home ? ?Chief Complaint:  ?Chief Complaint  ?Patient presents with  ? Shortness of Breath  ? ?HPI: [name] is a 71 y.o. adult with medical history significant for multiple health issues, htn, leg cellulitis, deconditioning and  ?Sob off and on for past month, actually its been going on for few months.  ?Pt has been abx for 3 weeks .  ?Pt got first abx for uti by urogyn. ?Then she got abx for pcp for uti. ?She continued to have swelling in her legs , and her thigh and abdomen. ?Then cardiologist usg of her heart and she had high pressures and so she was referred to ed. Cardiologist saw pt today, Dr. Fletcher Anon.  ?Chart review shows that she has anemia since 916 which is chronic discussed with her GI and hematology specially bleeding due to ongoing Eliquis therapy for irregular heart beat. Pt also has significant cellulitis with right leg and sees wound clinic.  ? ? ?Review of Systems: Review of Systems  ?Cardiovascular:  Positive for leg swelling.  ?Genitourinary:   ?     Pt has bl nephrolithiasis and one is obstructed.   ?Musculoskeletal:   ?     Pt has cellulitis of her leg and is seeing wound clinic.  ? ?  ? ?Past Medical History:  ?Diagnosis Date  ? Abnormal antibody titer   ? Anxiety and depression   ? Arthritis   ? Asthma   ? Atypical chest pain   ? a. 03/2018 MV: No ischemia/infarct. EF >65%.  ? Carotid arterial disease ([place])   ? a. 10/1189 U/S: RICA <47, LICA nl.  ? Chronic heart failure with preserved ejection fraction (HFpEF) ([place])   ? a. 08/2013 Echo: EF 60-65%; b. 08/2019 Echo: EF 60-65%; c. 03/2020 Echo: EF 60-65%; d. 05/2021 Echo: EF 55-60%, no rmwa, nl RV fxn, RVSP 54.91mHg. Mildly dil LA, mild MR, mod TR.  ? Cystocele   ? Depression   ? Diabetes ([place]   ? DVT (deep venous thrombosis) ([place]   ? Righ  calf  ? Dyspnea   ? 11/08/2018 - "more now due to lack of exercise"  ? Edema   ? GERD (gastroesophageal reflux disease)   ? Heart murmur   ? a. 08/2013 Echo: EF 60-65%, Mild MR (poss veg seen on TTE, not seen on TEE), nl LV size with mod conc LVH. Mild LAE; b. 08/2019 Echo: EF 60-65%, no rwma. Nl RV size/fxn. No significant valvular dzs.  ? History of IBS   ? History of kidney stones   ? History of methicillin resistant staphylococcus aureus (MRSA) 2008  ? History of multiple strokes   ? Hyperlipidemia   ? Hypertension   ? Hypothyroidism   ? Insomnia   ? Kidney stone   ? kidney stones with lithotripsy .  [facility]- "adrenal glands"  ? Mitral regurgitation   ? a. 05/2021 Echo: Mild MR.  ? Neuropathy involving both lower extremities   ? Non-healing ulcer of left foot ([place]   ? Obesity, Class II, BMI 35-39.9, with comorbidity   ? PAF (paroxysmal atrial fibrillation) ([place]   ? a. 07/2013 Event monitor: Freq PVCs and 3 short runs of Afib-->CHA2DS2VASc = 7-->Eliquis.  ? PAH (pulmonary artery hypertension) ([place]   ? a. 05/2021 Echo: RVSP 54.424mg.  ?  Peripheral vascular disease ([place])   ? a. 03/2018 LE Angio: Nl renal arteries. Nl aorta & iliacs. Nl LLE arterial flow; b. 06/2020 ABI's:  ? Sleep apnea   ? use C-PAP  ? Stroke [facility])   ? 03/2020  ? Tricuspid regurgitation   ? a. 05/2021 Echo: Mod TR (in setting of RVSP 54.37mHg).  ? Urinary incontinence   ? Venous stasis dermatitis of both lower extremities   ? ?Past Surgical History:  ?Procedure Laterality Date  ? ANKLE SURGERY Right   ? APPLICATION OF WOUND VAC Left 06/27/2015  ? Procedure: APPLICATION OF WOUND VAC ( POSSIBLE ) ;  Surgeon: JAlgernon Huxley MD;  Location: [facility] ORS;  Service: Vascular;  Laterality: Left;  ? APPLICATION OF WOUND VAC Left 09/25/2016  ? Procedure: APPLICATION OF WOUND VAC;  Surgeon: TAlbertine Patricia DPM;  Location: [facility];  Service: Podiatry;  Laterality: Left;  ? arm surgery    ? right    ? CHOLECYSTECTOMY    ? COLONOSCOPY    ? COLONOSCOPY WITH PROPOFOL  N/A 01/11/2017  ? Procedure: COLONOSCOPY WITH PROPOFOL;  Surgeon: SLollie Sails MD;  [name];  Service: Endoscopy;  Laterality: N/A;  ? COLONOSCOPY WITH PROPOFOL N/A 02/22/2017  ? Procedure: COLONOSCOPY WITH PROPOFOL;  Surgeon: SLollie Sails MD;  [facility];  Service: Endoscopy;  Laterality: N/A;  ? COLONOSCOPY WITH PROPOFOL N/A 05/31/2017  ? Procedure: COLONOSCOPY WITH PROPOFOL;  Surgeon: SLollie Sails MD;  [facility];  Service: Endoscopy;  Laterality: N/A;  ? ESOPHAGOGASTRODUODENOSCOPY (EGD) WITH PROPOFOL N/A 01/11/2017  ? Procedure: ESOPHAGOGASTRODUODENOSCOPY (EGD) WITH PROPOFOL;  Surgeon: SLollie Sails MD;  [facility];  Service: Endoscopy;  Laterality: N/A;  ? ESOPHAGOGASTRODUODENOSCOPY (EGD) WITH PROPOFOL N/A 10/25/2018  ? Procedure: ESOPHAGOGASTRODUODENOSCOPY (EGD) WITH PROPOFOL;  Surgeon: SLollie Sails MD;  [facility];  Service: Endoscopy;  Laterality: N/A;  ? ESOPHAGOGASTRODUODENOSCOPY (EGD) WITH PROPOFOL N/A 11/29/2019  ? Procedure: ESOPHAGOGASTRODUODENOSCOPY (EGD) WITH PROPOFOL;  Surgeon: [place], TBenay Pike MD;  Location: [facility] ENDOSCOPY;  Service: Gastroenterology;  Laterality: N/A;  ? HERNIA REPAIR    ? umbilical  ? HIATAL HERNIA REPAIR    ? I & D EXTREMITY Left 06/27/2015  ? Procedure: IRRIGATION AND DEBRIDEMENT EXTREMITY            ( CALF HEMATOMA ) POSSIBLE WOUND VAC;  Surgeon: JAlgernon Huxley MD;  Location: [facility];  Service: Vascular;  Laterality: Left;  ? INCISION AND DRAINAGE OF WOUND Left 09/25/2016  ? Procedure: IRRIGATION AND DEBRIDEMENT - PARTIAL RESECTION OF ACHILLES TENDON WITH WOUND VAC APPLICATION;  Surgeon: TAlbertine Patricia DPM;  Location: [facility];  Service: Podiatry;  Laterality: Left;  ? INCISION AND DRAINAGE OF WOUND Left 11/09/2018  ? Procedure: Excision of left foot wound with ACell placement;  Surgeon: DWallace Going DO;  [place]  Service: Plastics;  Laterality: Left;  ?  IRRIGATION AND DEBRIDEMENT ABSCESS Left 09/07/2016  ? Procedure: IRRIGATION AND DEBRIDEMENT ABSCESS LEFT HEEL;  Surgeon: TAlbertine Patricia DPM;  Location: [facility];  Service: Podiatry;  Laterality: Left;  ? JOINT REPLACEMENT  2013  ? left knee replacement  ? KIDNEY SURGERY Right   ? kidney stones  ? LITHOTRIPSY    ? LOWER EXTREMITY ANGIOGRAPHY Left 04/04/2018  ? Procedure: LOWER EXTREMITY ANGIOGRAPHY;  Surgeon: DAlgernon Huxley MD;  [facility];  Service: Cardiovascular;  Laterality: Left;  ? [facility]  February 2017  ? Likely  breast attenuation. LOW RISK study. Normal EF 55-60%.  ? OTHER SURGICAL HISTORY    ? 08/2016 or 09/2016 surgery on achilles tenso h/o staph infection heal. Dr. [name]  ? removal of left hematoma Left   ? leg  ? REVERSE SHOULDER ARTHROPLASTY Right 10/08/2015  ? Procedure: REVERSE SHOULDER ARTHROPLASTY;  Surgeon: [name], MD;  Location: [facility] OR[facility];  Service: Orthopedics;  Laterality: Right;  ? SLEEVE GASTROPLASTY    ? TONSILLECTOMY    ? TOTAL KNEE ARTHROPLASTY Left   ? TOTAL SHOULDER REPLACEMENT Right   ? TRANSESOPHAGEAL ECHOCARDIOGRAM  08/08/2013  ? Mild LVH, EF 60-65%. Moderate LA dilation and mild RA dilation. Mild MR with no evidence of stenosis and no evidence of endocarditis. A false chordae is noted.  ? TRANSTHORACIC ECHOCARDIOGRAM  08/03/2013  ? Mild-Moderate concentric LVH, EF 60-65%. Normal diastolic function. Mild LA dilation. Mild MR with possible vegetation  - not confirmed on TEE   ? ULNAR NERVE TRANSPOSITION Right 08/06/2016  ? Procedure: ULNAR NERVE DECOMPRESSION/TRANSPOSITION;  Surgeon: [name], MD;  Location: [facility];  Service: Orthopedics;  Laterality: Right;  ? UPPER GI ENDOSCOPY    ? VAGINAL HYSTERECTOMY    ? ?Social History:  reports that she has never smoked. She has never used smokeless tobacco. She reports that she does not drink alcohol and does not use drugs. ? ?Allergies  ?Allergen Reactions  ? Morphine And Related Other (See  Comments) and Nausea Only  ?  Patient becomes very confused ?Other reaction(s): Other (See Comments), Other (See Comments) ?Patient becomes very confused ?Patient becomes very confused ?  ? Penicillins Hives,

## 2021-06-06 NOTE — ED Provider Notes (Signed)
? ?[facility] ?Provider Note ? ? ? Event Date/Time  ? First MD Initiated Contact with Patient 06/06/21 1907   ?  (approximate) ? ? ?History  ? ?Shortness of Breath ? ? ?HPI ? ?[name] is a 71 y.o. adult with a past history of diabetes, atrial fibrillation, CHF who is sent to the ED from cardiology clinic due to worsening leg swelling, dyspnea on exertion, orthopnea over the past few months.  Reportedly had a 35 pound weight gain over the last few months.  She was seen in cardiology clinic, tried escalating doses of diuretics without relief.   ? ?Outside records from cardiology clinic reviewed which notes that she was recommended to be hospitalized for IV diuresis due to her escalating symptoms and outpatient treatment failure. ? ?Patient also notes that she has been treated for the past 4 weeks for UTI.  She reports her symptoms are improved.  Her doctor changed her to Cipro after obtaining a culture. ? ?She also reports having redness and pain in the right lower leg.  Denies chest pain. ? ?  ? ? ?Physical Exam  ? ?Triage Vital Signs: ?ED Triage Vitals  ?Enc Vitals Group  ?   BP 06/06/21 1756 117/78  ?   Pulse Rate 06/06/21 1755 66  ?   Resp 06/06/21 1755 18  ?   Temp 06/06/21 1755 98.7 ?F (37.1 ?C)  ?   Temp Source 06/06/21 1755 Oral  ?   SpO2 06/06/21 1756 93 %  ?   Weight --   ?   Height 06/06/21 1756 '5\' 6"'$  (1.676 m)  ?   Head Circumference --   ?   Peak Flow --   ?   Pain Score 06/06/21 1755 0  ?   Pain Loc --   ?   Pain Edu? --   ?   Excl. in [place]? --   ? ? ?Most recent vital signs: ?Vitals:  ? 06/06/21 2030 06/06/21 2050  ?BP: 126/61   ?Pulse: 67 63  ?Resp: 18   ?Temp:    ?SpO2: 95% 100%  ? ? ? ?General: Awake, no distress.  ?CV:  Good peripheral perfusion.  Regular rate and rhythm ?Resp:  Normal effort.  Bilateral basilar crackles.  No distress ?Abd:  No distention.  Soft and nontender. ?Other:  There is symmetric pitting edema bilateral lower extremities.  Right lower leg also  has redness warmth and tenderness without purulent drainage fluctuance or crepitus.  No lymphangitis. ? ? ?ED Results / Procedures / Treatments  ? ?Labs ?(all labs ordered are listed, but only abnormal results are displayed) ?Labs Reviewed  ?BRAIN NATRIURETIC PEPTIDE - Abnormal; Notable for the following components:  ?    Result Value  ? B Natriuretic Peptide 241.7 (*)   ? All other components within normal limits  ?CBC - Abnormal; Notable for the following components:  ? Hemoglobin 10.4 (*)   ? HCT 35.9 (*)   ? MCH 24.8 (*)   ? MCHC 29.0 (*)   ? RDW 24.6 (*)   ? All other components within normal limits  ?BASIC METABOLIC PANEL - Abnormal; Notable for the following components:  ? Glucose, Bld 128 (*)   ? Creatinine, Ser 1.15 (*)   ? Calcium 8.5 (*)   ? GFR, Estimated 51 (*)   ? All other components within normal limits  ?URINALYSIS, ROUTINE W REFLEX MICROSCOPIC - Abnormal; Notable for the following components:  ? Color, Urine STRAW (*)   ?  APPearance CLEAR (*)   ? Specific Gravity, Urine 1.004 (*)   ? All other components within normal limits  ?URINE CULTURE  ?RESP PANEL BY RT-PCR (FLU A&B, COVID) ARPGX2  ?MAGNESIUM  ? ? ? ?EKG ? ?Interpreted by me ?Atrial fibrillation, rate of 62.  Normal axis, normal intervals.  Normal QRS ST segments and T waves ? ? ?RADIOLOGY ?Chest X reviewed and interpreted by me, unremarkable.  No consolidation pulmonary edema or pleural effusion.  Radiology report reviewed ? ? ? ?PROCEDURES: ? ?Critical Care performed: No ? ?Procedures ? ? ?MEDICATIONS ORDERED IN ED: ?Medications  ?vancomycin (VANCOCIN) IVPB 1000 mg/200 mL premix (has no administration in time range)  ?  Followed by  ?vancomycin (VANCOREADY) IVPB 1500 mg/300 mL (has no administration in time range)  ?furosemide (LASIX) injection 80 mg (80 mg Intravenous Given 06/06/21 1940)  ? ? ? ?IMPRESSION / MDM / ASSESSMENT AND PLAN / ED COURSE  ?I reviewed the triage vital signs and the nursing notes. ?             ?                ? ? ? ?Patient presents with symptoms of decompensated heart failure.  Doubt ACS PE dissection pneumonia or sepsis.  Will give IV Lasix as recommended by cardiology, vancomycin for right leg cellulitis, case discussed with hospitalist for further management. ? ? ?  ? ? ?FINAL CLINICAL IMPRESSION(S) / ED DIAGNOSES  ? ?Final diagnoses:  ?Acute on chronic congestive heart failure, unspecified heart failure type ([place])  ?Cellulitis of right lower extremity  ? ? ? ?Rx / DC Orders  ? ?ED Discharge Orders   ? ? None  ? ?  ? ? ? ?Note:  This document was prepared using Dragon voice recognition software and may include unintentional dictation errors. ?  ?Carrie Mew, MD ?06/06/21 2331 ? ?

## 2021-06-07 DIAGNOSIS — K219 Gastro-esophageal reflux disease without esophagitis: Secondary | ICD-10-CM | POA: Diagnosis not present

## 2021-06-07 DIAGNOSIS — R0602 Shortness of breath: Secondary | ICD-10-CM | POA: Diagnosis not present

## 2021-06-07 DIAGNOSIS — I2721 Secondary pulmonary arterial hypertension: Secondary | ICD-10-CM | POA: Diagnosis not present

## 2021-06-07 DIAGNOSIS — Z6841 Body Mass Index (BMI) 40.0 and over, adult: Secondary | ICD-10-CM | POA: Diagnosis not present

## 2021-06-07 DIAGNOSIS — I071 Rheumatic tricuspid insufficiency: Secondary | ICD-10-CM | POA: Diagnosis not present

## 2021-06-07 DIAGNOSIS — I5033 Acute on chronic diastolic (congestive) heart failure: Secondary | ICD-10-CM | POA: Diagnosis not present

## 2021-06-07 DIAGNOSIS — L03116 Cellulitis of left lower limb: Secondary | ICD-10-CM | POA: Diagnosis not present

## 2021-06-07 DIAGNOSIS — F419 Anxiety disorder, unspecified: Secondary | ICD-10-CM | POA: Diagnosis not present

## 2021-06-07 DIAGNOSIS — Z7901 Long term (current) use of anticoagulants: Secondary | ICD-10-CM | POA: Diagnosis not present

## 2021-06-07 DIAGNOSIS — I872 Venous insufficiency (chronic) (peripheral): Secondary | ICD-10-CM | POA: Diagnosis not present

## 2021-06-07 DIAGNOSIS — Z20822 Contact with and (suspected) exposure to covid-19: Secondary | ICD-10-CM | POA: Diagnosis not present

## 2021-06-07 DIAGNOSIS — M1990 Unspecified osteoarthritis, unspecified site: Secondary | ICD-10-CM | POA: Diagnosis not present

## 2021-06-07 DIAGNOSIS — R001 Bradycardia, unspecified: Secondary | ICD-10-CM | POA: Diagnosis not present

## 2021-06-07 DIAGNOSIS — G4733 Obstructive sleep apnea (adult) (pediatric): Secondary | ICD-10-CM | POA: Diagnosis not present

## 2021-06-07 DIAGNOSIS — D649 Anemia, unspecified: Secondary | ICD-10-CM | POA: Diagnosis not present

## 2021-06-07 DIAGNOSIS — I110 Hypertensive heart disease with heart failure: Secondary | ICD-10-CM | POA: Diagnosis not present

## 2021-06-07 DIAGNOSIS — E039 Hypothyroidism, unspecified: Secondary | ICD-10-CM | POA: Diagnosis not present

## 2021-06-07 DIAGNOSIS — R32 Unspecified urinary incontinence: Secondary | ICD-10-CM | POA: Diagnosis not present

## 2021-06-07 DIAGNOSIS — J9601 Acute respiratory failure with hypoxia: Secondary | ICD-10-CM | POA: Diagnosis not present

## 2021-06-07 DIAGNOSIS — I480 Paroxysmal atrial fibrillation: Secondary | ICD-10-CM | POA: Diagnosis not present

## 2021-06-07 DIAGNOSIS — L03115 Cellulitis of right lower limb: Secondary | ICD-10-CM | POA: Diagnosis not present

## 2021-06-07 DIAGNOSIS — F32A Depression, unspecified: Secondary | ICD-10-CM | POA: Diagnosis not present

## 2021-06-07 DIAGNOSIS — E1142 Type 2 diabetes mellitus with diabetic polyneuropathy: Secondary | ICD-10-CM | POA: Diagnosis not present

## 2021-06-07 DIAGNOSIS — E1151 Type 2 diabetes mellitus with diabetic peripheral angiopathy without gangrene: Secondary | ICD-10-CM | POA: Diagnosis not present

## 2021-06-07 LAB — CBG MONITORING, ED
Glucose-Capillary: 120 mg/dL — ABNORMAL HIGH (ref 70–99)
Glucose-Capillary: 105 mg/dL — ABNORMAL HIGH (ref 70–99)
Glucose-Capillary: 187 mg/dL — ABNORMAL HIGH (ref 70–99)
Glucose-Capillary: 225 mg/dL — ABNORMAL HIGH (ref 70–99)

## 2021-06-07 LAB — PROCALCITONIN: Procalcitonin: <0.1 ng/mL

## 2021-06-07 LAB — GLUCOSE, CAPILLARY: Glucose-Capillary: 139 mg/dL — ABNORMAL HIGH (ref 70–99)

## 2021-06-07 MED ORDER — VANCOMYCIN HCL 1500 MG/300ML IV SOLN
1500.0000 mg | INTRAVENOUS | Status: DC
  Administered 2021-06-07 – 2021-06-08 (×2): 1500 mg via INTRAVENOUS
  Filled 2021-06-07 (×2): qty 300

## 2021-06-07 MED ORDER — ALPRAZOLAM 0.5 MG PO TABS
0.5000 mg | ORAL_TABLET | Freq: Every evening | ORAL | Status: DC | PRN
  Administered 2021-06-09: 0.5 mg via ORAL
  Filled 2021-06-07: qty 1

## 2021-06-07 MED ORDER — APIXABAN 5 MG PO TABS
5.0000 mg | ORAL_TABLET | Freq: Two times a day (BID) | ORAL | Status: DC
  Administered 2021-06-07 – 2021-06-10 (×7): 5 mg via ORAL
  Filled 2021-06-07 (×8): qty 1

## 2021-06-07 MED ORDER — PANTOPRAZOLE SODIUM 40 MG PO TBEC
40.0000 mg | DELAYED_RELEASE_TABLET | Freq: Every day | ORAL | Status: DC
  Administered 2021-06-07 – 2021-06-10 (×4): 40 mg via ORAL
  Filled 2021-06-07 (×4): qty 1

## 2021-06-07 MED ORDER — ROSUVASTATIN CALCIUM 10 MG PO TABS
20.0000 mg | ORAL_TABLET | Freq: Every evening | ORAL | Status: DC
  Administered 2021-06-07 – 2021-06-09 (×3): 20 mg via ORAL
  Filled 2021-06-07: qty 1
  Filled 2021-06-07: qty 2
  Filled 2021-06-07: qty 1
  Filled 2021-06-07: qty 2

## 2021-06-07 MED ORDER — LOSARTAN POTASSIUM 50 MG PO TABS
50.0000 mg | ORAL_TABLET | Freq: Every day | ORAL | Status: DC
  Administered 2021-06-07 – 2021-06-10 (×4): 50 mg via ORAL
  Filled 2021-06-07 (×4): qty 1

## 2021-06-07 MED ORDER — FUROSEMIDE 10 MG/ML IJ SOLN
40.0000 mg | Freq: Every day | INTRAMUSCULAR | Status: DC
Start: 2021-06-08 — End: 2021-06-09
  Administered 2021-06-08: 40 mg via INTRAVENOUS
  Filled 2021-06-07: qty 4

## 2021-06-07 MED ORDER — LEVOTHYROXINE SODIUM 137 MCG PO TABS
137.0000 ug | ORAL_TABLET | Freq: Every day | ORAL | Status: DC
  Administered 2021-06-08 – 2021-06-10 (×3): 137 ug via ORAL
  Filled 2021-06-07 (×3): qty 1

## 2021-06-07 NOTE — Evaluation (Signed)
Occupational Therapy Evaluation ?Patient Details ?Name: [name] ?MRN: [id number] ?DOB: [birth year] ?Today's Date: 06/07/2021 ? ? ?History of Present Illness [name] is a 71 y.o. adult with a past history of diabetes, atrial fibrillation, CHF who is sent to the ED from cardiology clinic due to worsening leg swelling, dyspnea on exertion, orthopnea over the past few months.  Reportedly had a 35 pound weight gain over the last few months.  She was seen in cardiology clinic, tried escalating doses of diuretics without relief.   Patient admitted for shortness of breath/acute respiratory failure with hypoxia/ CHF. She also has Afib/hx of CVA and reccurent UTI, Rt LE cellulitis.  ? ?Clinical Impression ?  ?[name] was seen for OT evaluation this date. Prior to hospital admission, pt was MOD I for mobility using RW. Pt lives with spouse in home c ramped entrance. Pt presents to acute OT demonstrating impaired ADL performance and functional mobility 2/2 decreased activity tolerance and functional strength/ROM/balance deficits. Pt currently requires MAX A don B socks seated EOB. MIN A + QC sit<>stand and ~3 steps along EOB, increased assist returning to elevated bed height. Pt would benefit from skilled OT to address noted impairments and functional limitations (see below for any additional details). Upon hospital discharge, recommend HHOT to maximize pt safety and return to PLOF. ?  ? ?Recommendations for follow up therapy are one component of a multi-disciplinary discharge planning process, led by the attending physician.  Recommendations may be updated based on patient status, additional functional criteria and insurance authorization.  ? ?Follow Up Recommendations ? Home health OT  ?  ?Assistance Recommended at Discharge Intermittent Supervision/Assistance  ?Patient can return home with the following A little help with walking and/or transfers;A little help with bathing/dressing/bathroom;Help with stairs or  ramp for entrance ? ?  ?Functional Status Assessment ? Patient has had a recent decline in their functional status and demonstrates the ability to make significant improvements in function in a reasonable and predictable amount of time.  ?Equipment Recommendations ? None recommended by OT  ?  ?Recommendations for Other Services   ? ? ?  ?Precautions / Restrictions Precautions ?Precautions: Fall ?Restrictions ?Weight Bearing Restrictions: No  ? ?  ? ?Mobility Bed Mobility ?Overal bed mobility: Needs Assistance ?Bed Mobility: Supine to Sit, Sit to Supine ?  ?  ?Supine to sit: Min assist ?Sit to supine: Mod assist ?  ?  ?  ? ?Transfers ?Overall transfer level: Needs assistance ?Equipment used: Quad cane ?Transfers: Sit to/from Stand ?Sit to Stand: Min assist ?  ?  ?  ?  ?  ?  ?  ? ?  ?Balance Overall balance assessment: Needs assistance ?Sitting-balance support: No upper extremity supported, Feet supported ?Sitting balance-Leahy Scale: Fair ?  ?  ?Standing balance support: Single extremity supported ?Standing balance-Leahy Scale: Fair ?  ?  ?  ?  ?  ?  ?  ?  ?  ?  ?  ?  ?   ? ?ADL either performed or assessed with clinical judgement  ? ?ADL Overall ADL's : Needs assistance/impaired ?  ?  ?  ?  ?  ?  ?  ?  ?  ?  ?  ?  ?  ?  ?  ?  ?  ?  ?  ?General ADL Comments: MAX A don B socks seated EOB. MIN A + QC for ADL t/f, increased assist returning to elevated bed height.  ? ? ? ? ?Pertinent Vitals/Pain  Pain Assessment ?Pain Assessment: Faces ?Faces Pain Scale: Hurts a little bit ?Pain Location: L shoulder ?Pain Descriptors / Indicators: Discomfort, Dull ?Pain Intervention(s): Limited activity within patient's tolerance, Repositioned  ? ? ? ?Hand Dominance   ?  ?Extremity/Trunk Assessment Upper Extremity Assessment ?Upper Extremity Assessment: LUE deficits/detail ?LUE Deficits / Details: hx of rotator cuff injury, limited AROM ?  ?Lower Extremity Assessment ?Lower Extremity Assessment: Generalized weakness ?  ?  ?   ?Communication Communication ?Communication: No difficulties ?  ?Cognition Arousal/Alertness: Awake/alert ?Behavior During Therapy: [employer] for tasks assessed/performed ?Overall Cognitive Status: Within Functional Limits for tasks assessed ?  ?  ?  ?  ?  ?  ?  ?  ?  ?  ?  ?  ?  ?  ?  ?  ?  ?  ?  ?   ?   ?   ? ? ?Home Living Family/patient expects to be discharged to:: Private residence ?Living Arrangements: Spouse/significant other ?Available Help at Discharge: Family;Available 24 hours/day ?Type of Home: House ?Home Access: Ramped entrance ?  ?  ?Home Layout: One level ?  ?  ?Bathroom Shower/Tub: Tub/shower unit ?  ?  ?  ?  ?Home Equipment: Conservation officer, nature (2 wheels);Cane - quad;BSC/3in1;Tub bench;Wheelchair - manual ?  ?  ?  ? ?  ?Prior Functioning/Environment Prior Level of Function : Needs assist ?  ?  ?  ?  ?  ?  ?Mobility Comments: SBA + RW for household mobility, progressing to QC use at home, husband provides chair follow 2/2 fall risk. ?ADLs Comments: assist for washing bottom in shower only ?  ? ?  ?  ?OT Problem List: Decreased strength;Decreased range of motion;Decreased activity tolerance;Impaired balance (sitting and/or standing);Decreased safety awareness ?  ?   ?OT Treatment/Interventions: Self-care/ADL training;Therapeutic exercise;Energy conservation;DME and/or AE instruction;Therapeutic activities;Patient/family education;Balance training  ?  ?OT Goals(Current goals can be found in the care plan section) Acute Rehab OT Goals ?Patient Stated Goal: to walk without a walker ?OT Goal Formulation: With patient/family ?Time For Goal Achievement: 06/21/21 ?Potential to Achieve Goals: Good ?ADL Goals ?Pt Will Perform Grooming: with min assist;standing ?Pt Will Perform Lower Body Dressing: with modified independence;sit to/from stand ?Pt Will Transfer to Toilet: with modified independence;ambulating;regular height toilet (c LRAD PRN)  ?OT Frequency: Min 2X/week ?  ? ?Co-evaluation   ?  ?  ?  ?  ? ?  ?AM-PAC OT  "6 Clicks" Daily Activity     ?Outcome Measure Help from another person eating meals?: None ?Help from another person taking care of personal grooming?: A Little ?Help from another person toileting, which includes using toliet, bedpan, or urinal?: A Little ?Help from another person bathing (including washing, rinsing, drying)?: A Little ?Help from another person to put on and taking off regular upper body clothing?: None ?Help from another person to put on and taking off regular lower body clothing?: A Lot ?6 Click Score: 19 ?  ?End of Session Nurse Communication: Mobility status ? ?Activity Tolerance: Patient tolerated treatment well ?Patient left: in bed;with call bell/phone within reach;with family/visitor present ? ?OT Visit Diagnosis: Unsteadiness on feet (R26.81);Muscle weakness (generalized) (M62.81)  ?              ?Time: 9211-9417 ?OT Time Calculation (min): 35 min ?Charges:  OT General Charges ?$OT Visit: 1 Visit ?OT Evaluation ?$OT Eval Low Complexity: 1 Low ?OT Treatments ?$Self Care/Home Management : 23-37 mins ? ?Dessie Coma, M.S. OTR/L  ?06/07/21, 3:07 PM  ?ascom  [phone] ? ?

## 2021-06-07 NOTE — Consult Note (Signed)
Pharmacy Antibiotic Note ? ?[name] is a 71 y.o. adult admitted on 06/06/2021 with cellulitis.  Pharmacy has been consulted for Vancomycin dosing. ? ?Plan: ?Received Ciprofloxacin '500mg'$  PO BID x5d (3/12-3/17) PTA  ?Vancomycin 2.5g IV (1g+1.5g) x1 in ED for initial loading dose recieved 3/17 '@2248'$ ; maintenance dosing beginning approximately 15hrs after first dose w/ new consult. ?Initiate Vancomycin 1.5g IV q24h (3/18 1500>> ?Goal AUC 400-550 >> Est AUC: 432.8; Cmax: 32.5; Cmin: 9.4 ?SCr 1.15; AdjBW since BMI ~45; Vd 0.5 ? ?F/u MRSA PCR ordered & cultures. ? ? ?Height: '5\' 6"'$  (167.6 cm) ?Weight: 126.1 kg (278 lb) ?IBW/kg (Calculated) : 59.3 ? ?Temp (24hrs), Avg:98.7 ?F (37.1 ?C), Min:98.7 ?F (37.1 ?C), Max:98.7 ?F (37.1 ?C) ? ?Recent Labs  ?Lab 06/06/21 ?4709  ?WBC 6.7  ?CREATININE 1.15*  ?  ?Estimated Creatinine Clearance (by C-G formula based on SCr of 1.15 mg/dL (H)) ?Female: 60.9 mL/min (A) ?Female: 73.9 mL/min (A)   ? ?Allergies  ?Allergen Reactions  ? Morphine And Related Other (See Comments) and Nausea Only  ?  Patient becomes very confused ?Other reaction(s): Other (See Comments), Other (See Comments) ?Patient becomes very confused ?Patient becomes very confused ?  ? Penicillins Hives, Shortness Of Breath, Swelling and Other (See Comments)  ?  Facial swelling ?Has patient had a PCN reaction causing immediate rash, facial/tongue/throat swelling, SOB or lightheadedness with hypotension: Yes ?Has patient had a PCN reaction causing severe rash involving mucus membranes or skin necrosis: Yes ?Has patient had a PCN reaction that required hospitalization No ?Has patient had a PCN reaction occurring within the last 10 years: No ?If all of the above answers are "NO", then may proceed with Cephalosporin use. ? ?Other reaction(s): Other (See Comments), Other (See Comments) ?Facial swelling ?Has patient had a PCN reaction causing immediate rash, facial/tongue/throat swelling, SOB or lightheadedness with hypotension:  Yes ?Has patient had a PCN reaction causing severe rash involving mucus membranes or skin necrosis: Yes ?Has patient had a PCN reaction that required hospitalization No ?Has patient had a PCN reaction occurring within the last 10 years: No ?If all of the above answers are "NO", then may proceed with Cephalosporin use. ?Facial swelling ?Has patient had a PCN reaction causing immediate rash, facial/tongue/throat swelling, SOB or lightheadedness with hypotension: Yes ?Has patient had a PCN reaction causing severe rash involving mucus membranes or skin necrosis: Yes ?Has patient had a PCN reaction that required hospitalization No ?Has patient had a PCN reaction occurring within the last 10 years: No ?If all of the above answers are "NO", then may proceed with Cephalosporin use. ?Facial swelling ?Has patient had a PCN reaction causing immediate rash, facial/tongue/throat swelling, SOB or lightheadedness with hypotension: Yes ?Has patient had a PCN reaction causing severe rash involving mucus membranes or skin necrosis: Yes ?Has patient had a PCN reaction that required hospitalization No ?Has patient had a PCN reaction occurring within the last 10 years: No ?If all of the above answers are "NO", then may proceed with Cephalosporin use. ?  ? Ambien [Zolpidem]   ?  Hallucination   ? Aspirin Hives  ? Oxytetracycline Hives  ?  Other reaction(s): Unknown ?  ? ? ?Antimicrobials this admission: ?Vancomycin (3/17PM >>  ? ?Dose adjustments this admission: ?CTM and adjust PRN. Pt appears to be at baseline SCr ? ?Microbiology results: ?3/17 UCx: pending  ?3/17 Flu/Cov: negative  ?3/18 MRSA PCR: ordered ? ?Thank you for allowing pharmacy to be a part of this patient?s care. ? ?Erlene Quan  D Braison Snoke ?06/07/2021 2:51 PM ? ?

## 2021-06-07 NOTE — Progress Notes (Signed)
PT Cancellation Note ? ?Patient Details ?Name: [name] ?MRN: [id number] ?DOB: 08-21-1950 ? ? ?Cancelled Treatment:    Reason Eval/Treat Not Completed: Patient declined, no reason specified (Patient already got up with OT and feels too tired to be willing to get up with PT. She agrees to complete PT eval tomorrow. She states she has goals of being able to get out of the wheelchair at home but her husband is too afraid she will fall. She would like to try a quad cane at her PT eval) ? ?[name]. Graylon Good, PT, DPT ?06/07/21, 2:48 PM ? ?

## 2021-06-07 NOTE — Progress Notes (Signed)
PROGRESS NOTE    [name]  ZOX:[id number] DOB: 1950-06-21 DOA: 06/06/2021  PCP: [name], Pasty Spillers, MD    Brief Narrative:  This 71 years old female with PMH significant for hypertension, paroxysmal A-fib on chronic anticoagulation, obstructive sleep apnea, diabetes mellitus, hypothyroidism, GERD, recurrent UTI, history of stroke, morbid obesity presented to the ED with multiple complaints.  She has chronic recurrent UTI and recently completed antibiotic course.  Patient is admitted for possible cellulitis and acute hypoxic respiratory failure secondary to CHF.  Assessment & Plan:   Principal Problem:   SOB (shortness of breath) Active Problems:   Chronic anticoagulation (Eliquis)   AF (paroxysmal atrial fibrillation) (HCC)   Essential hypertension   OSA (obstructive sleep apnea)   DM (diabetes mellitus) type II, controlled, with peripheral vascular disorder (HCC)   Hypothyroidism   Gastro-esophageal reflux disease without esophagitis   Cellulitis   Anemia   Recurrent UTI   Acute respiratory failure with hypoxia (HCC)   Stroke (HCC)  Acute hypoxic respiratory failure sec. to Acute on chronic diastolic CHF: She presented with shortness of breath, hypoxia SPO2 87% on RA, improved to 100% on 2 L. Most recent echo 3/23 shows LVEF 55 to 60%.  No RWMA. Patient received Lasix 80 mg in the ED. Continue Lasix IV 40 mg daily.  Continue losartan 50 mg daily Monitor daily weight, intake output charting. Consider cardiology consult.  Intake/Output Summary (Last 24 hours) at 06/07/2021 1502 Last data filed at 06/07/2021 1014 Gross per 24 hour  Intake --  Output 2350 ml  Net -2350 ml    Atrial fibrillation: Heart rate controlled, in fact low. Continue Eliquis for anticoagulation  Recurrent UTI: Patient recently completed a course of antibiotics UA unremarkable , follow-up urine culture.  Bilateral lower extremity cellulitis: Lower extremities appears erythematous warm  and tender. Continue vancomycin for now follow-up cultures.  Type 2 diabetes: Start regular insulin sliding scale. Hold p.o. diabetic medications  Hypothyroidism: New levothyroxine  GERD: Continue pantoprazole  Obstructive sleep apnea: CPAP at night.    DVT prophylaxis: Eliquis Code Status: Full code Family Communication: No family at bedside Disposition Plan:  Status is: Inpatient Remains inpatient appropriate because: Admitted for acute hypoxic respiratory failure secondary to CHF exacerbation and bilateral leg cellulitis requiring IV antibiotics.    Consultants:  None  Procedures: None Antimicrobials:  Anti-infectives (From admission, onward)    Start     Dose/Rate Route Frequency Ordered Stop   06/06/21 2000  vancomycin (VANCOCIN) IVPB 1000 mg/200 mL premix  Status:  Discontinued        1,000 mg 200 mL/hr over 60 Minutes Intravenous  Once 06/06/21 1953 06/06/21 1957   06/06/21 2000  vancomycin (VANCOCIN) IVPB 1000 mg/200 mL premix       See Hyperspace for full Linked Orders Report.   1,000 mg 200 mL/hr over 60 Minutes Intravenous  Once 06/06/21 1957 06/06/21 2238   06/06/21 2000  vancomycin (VANCOREADY) IVPB 1500 mg/300 mL       See Hyperspace for full Linked Orders Report.   1,500 mg 150 mL/hr over 120 Minutes Intravenous  Once 06/06/21 1957 06/07/21 0111       Subjective: Patient was seen and examined at bedside.  Overnight events noted.   Patient is lying comfortably on the bed with 2 L of supplemental oxygen sats 94%. She denies any chest pain, dizziness, palpitations.  Objective: Vitals:   06/07/21 1100 06/07/21 1200 06/07/21 1230 06/07/21 1330  BP: 131/67 (!) 135/110 136/71 117/66  Pulse: (!) 58     Resp: 20 20 20 20   Temp:      TempSrc:      SpO2: 95%     Weight:      Height:        Intake/Output Summary (Last 24 hours) at 06/07/2021 1502 Last data filed at 06/07/2021 1014 Gross per 24 hour  Intake --  Output 2350 ml  Net -2350 ml    Filed Weights   06/06/21 2107  Weight: 126.1 kg    Examination:  General exam: Appears comfortable, not in any acute distress.  Deconditioned Respiratory system: Decreased breath sounds bilaterally, respiratory effort normal, no accessory muscle use.  RR 15 Cardiovascular system: S1 & S2 heard, irregular rhythm, no murmur.. Gastrointestinal system: Abdomen is soft, nondistended, nontender, BS + Central nervous system: Alert and oriented x 2. No focal neurological deficits. Extremities: No edema, both lower extremities erythematous, warm,tender. Skin: No rashes, lesions or ulcers Psychiatry: Judgement and insight appear normal. Mood & affect appropriate.     Data Reviewed: I have personally reviewed following labs and imaging studies  CBC: Recent Labs  Lab 06/06/21 1804  WBC 6.7  HGB 10.4*  HCT 35.9*  MCV 85.7  PLT 183   Basic Metabolic Panel: Recent Labs  Lab 06/06/21 1804  NA 140  K 4.7  CL 103  CO2 31  GLUCOSE 128*  BUN 23  CREATININE 1.15*  CALCIUM 8.5*  MG 2.0   GFR: Estimated Creatinine Clearance (by C-G formula based on SCr of 1.15 mg/dL (H)) Female: 16.1 mL/min (A) Female: 73.9 mL/min (A) Liver Function Tests: No results for input(s): AST, ALT, ALKPHOS, BILITOT, PROT, ALBUMIN in the last 168 hours. No results for input(s): LIPASE, AMYLASE in the last 168 hours. No results for input(s): AMMONIA in the last 168 hours. Coagulation Profile: No results for input(s): INR, PROTIME in the last 168 hours. Cardiac Enzymes: No results for input(s): CKTOTAL, CKMB, CKMBINDEX, TROPONINI in the last 168 hours. BNP (last 3 results) No results for input(s): PROBNP in the last 8760 hours. HbA1C: No results for input(s): HGBA1C in the last 72 hours. CBG: Recent Labs  Lab 06/07/21 0610 06/07/21 0825 06/07/21 1237  GLUCAP 120* 105* 187*   Lipid Profile: No results for input(s): CHOL, HDL, LDLCALC, TRIG, CHOLHDL, LDLDIRECT in the last 72 hours. Thyroid  Function Tests: No results for input(s): TSH, T4TOTAL, FREET4, T3FREE, THYROIDAB in the last 72 hours. Anemia Panel: No results for input(s): VITAMINB12, FOLATE, FERRITIN, TIBC, IRON, RETICCTPCT in the last 72 hours. Sepsis Labs: No results for input(s): PROCALCITON, LATICACIDVEN in the last 168 hours.  Recent Results (from the past 240 hour(s))  Urine Culture     Status: Abnormal   Collection Time: 05/30/21 11:49 AM   Specimen: Urine  Result Value Ref Range Status   MICRO NUMBER: [id number]  Final   SPECIMEN QUALITY: Adequate  Final   Sample Source NOT GIVEN  Final   STATUS: FINAL  Final   ISOLATE 1: Klebsiella pneumoniae (A)  Final    Comment: Greater than 100,000 CFU/mL of Klebsiella pneumoniae      Susceptibility   Klebsiella pneumoniae - URINE CULTURE, REFLEX    AMOX/CLAVULANIC <=2 Sensitive     AMPICILLIN >=32 Resistant     AMPICILLIN/SULBACTAM 8 Sensitive     CEFAZOLIN* <=4 Not Reportable      * For infections other than uncomplicated UTI caused by E. coli, K. pneumoniae or P. mirabilis: Cefazolin is resistant if MIC >  or = 8 mcg/mL. (Distinguishing susceptible versus intermediate for isolates with MIC < or = 4 mcg/mL requires additional testing.) For uncomplicated UTI caused by E. coli, K. pneumoniae or P. mirabilis: Cefazolin is susceptible if MIC <32 mcg/mL and predicts susceptible to the oral agents cefaclor, cefdinir, cefpodoxime, cefprozil, cefuroxime, cephalexin and loracarbef.     CEFTAZIDIME <=1 Sensitive     CEFEPIME <=1 Sensitive     CEFTRIAXONE <=1 Sensitive     CIPROFLOXACIN <=0.25 Sensitive     LEVOFLOXACIN 1 Intermediate     GENTAMICIN <=1 Sensitive     IMIPENEM <=0.25 Sensitive     NITROFURANTOIN >=512 Resistant     PIP/TAZO 16 Sensitive     TOBRAMYCIN <=1 Sensitive     TRIMETH/SULFA* >=320 Resistant      * For infections other than uncomplicated UTI caused by E. coli, K. pneumoniae or P. mirabilis: Cefazolin is resistant if MIC > or = 8  mcg/mL. (Distinguishing susceptible versus intermediate for isolates with MIC < or = 4 mcg/mL requires additional testing.) For uncomplicated UTI caused by E. coli, K. pneumoniae or P. mirabilis: Cefazolin is susceptible if MIC <32 mcg/mL and predicts susceptible to the oral agents cefaclor, cefdinir, cefpodoxime, cefprozil, cefuroxime, cephalexin and loracarbef. Legend: S = Susceptible  I = Intermediate R = Resistant  NS = Not susceptible * = Not tested  NR = Not reported **NN = See antimicrobic comments   Resp Panel by RT-PCR (Flu A&B, Covid) Nasopharyngeal Swab     Status: None   Collection Time: 06/06/21  7:38 PM   Specimen: Nasopharyngeal Swab; Nasopharyngeal(NP) swabs in vial transport medium  Result Value Ref Range Status   SARS Coronavirus 2 by RT PCR NEGATIVE NEGATIVE Final    Comment: (NOTE) SARS-CoV-2 target nucleic acids are NOT DETECTED.  The SARS-CoV-2 RNA is generally detectable in upper respiratory specimens during the acute phase of infection. The lowest concentration of SARS-CoV-2 viral copies this assay can detect is 138 copies/mL. A negative result does not preclude SARS-Cov-2 infection and should not be used as the sole basis for treatment or other patient management decisions. A negative result may occur with  improper specimen collection/handling, submission of specimen other than nasopharyngeal swab, presence of viral mutation(s) within the areas targeted by this assay, and inadequate number of viral copies(<138 copies/mL). A negative result must be combined with clinical observations, patient history, and epidemiological information. The expected result is Negative.  Fact Sheet for Patients:  BloggerCourse.com  Fact Sheet for Healthcare Providers:  SeriousBroker.it  This test is no t yet approved or cleared by the [place] FDA and  has been authorized for detection and/or diagnosis of SARS-CoV-2  by FDA under an Emergency Use Authorization (EUA). This EUA will remain  in effect (meaning this test can be used) for the duration of the COVID-19 declaration under Section 564(b)(1) of the Act, 21 U.S.C.section 360bbb-3(b)(1), unless the authorization is terminated  or revoked sooner.       Influenza A by PCR NEGATIVE NEGATIVE Final   Influenza B by PCR NEGATIVE NEGATIVE Final    Comment: (NOTE) The Xpert Xpress SARS-CoV-2/FLU/RSV plus assay is intended as an aid in the diagnosis of influenza from Nasopharyngeal swab specimens and should not be used as a sole basis for treatment. Nasal washings and aspirates are unacceptable for Xpert Xpress SARS-CoV-2/FLU/RSV testing.  Fact Sheet for Patients: BloggerCourse.com  Fact Sheet for Healthcare Providers: SeriousBroker.it  This test is not yet approved or cleared by the [place]  FDA and has been authorized for detection and/or diagnosis of SARS-CoV-2 by FDA under an Emergency Use Authorization (EUA). This EUA will remain in effect (meaning this test can be used) for the duration of the COVID-19 declaration under Section 564(b)(1) of the Act, 21 U.S.C. section 360bbb-3(b)(1), unless the authorization is terminated or revoked.  Performed at [facility], [address] [place], [place] [address]     Radiology Studies: CT ABDOMEN PELVIS WO CONTRAST  Result Date: 06/06/2021 CLINICAL DATA:  Abdominal pain, acute, nonlocalized EXAM: CT ABDOMEN AND PELVIS WITHOUT CONTRAST TECHNIQUE: Multidetector CT imaging of the abdomen and pelvis was performed following the standard protocol without IV contrast. RADIATION DOSE REDUCTION: This exam was performed according to the departmental dose-optimization program which includes automated exposure control, adjustment of the mA and/or kV according to patient size and/or use of iterative reconstruction technique. COMPARISON:  02/07/2020,  08/12/2017 FINDINGS: Lower chest: Mild right basilar atelectasis. Cardiac size within normal limits. Central pulmonary arteries are enlarged in keeping with changes of pulmonary arterial hypertension. Small hiatal hernia. Hepatobiliary: No focal liver abnormality is seen. Status post cholecystectomy. No biliary dilatation. Pancreas: Unremarkable Spleen: Unremarkable Adrenals/Urinary Tract: Bilateral macroscopic fat containing adrenal masses are again identified in keeping with bilateral adrenal myelolipoma. Since remote prior examination, these demonstrate interval increase in size, now measuring 9.4 cm on the left and 9.3 cm in greatest dimension on the right. The kidneys are normal in size and position. No hydronephrosis. Nonobstructing 6 mm calculus within the left renal pelvis. No additional renal or ureteral calculi. Bladder unremarkable. Stomach/Bowel: Surgical changes of gastric sleeve resection are identified. The stomach, small bowel, and large bowel are otherwise unremarkable. Appendix normal. No free intraperitoneal gas or fluid. Vascular/Lymphatic: No significant vascular findings are present. No enlarged abdominal or pelvic lymph nodes. Reproductive: Status post hysterectomy. No adnexal masses. Other: Moderate subcutaneous infiltration is seen within the pannus and flanks bilaterally, most commonly reflecting subcutaneous edema. No abdominal wall hernia. Musculoskeletal: No acute bone abnormality. Degenerative changes are seen within the thoracolumbar spine. Stable T12 mild compression deformity. IMPRESSION: No acute intra-abdominal pathology identified. No definite radiographic explanation for the patient's reported symptoms. Bilateral benign adrenal myelolipomas again identified demonstrating mild interval increase in size since prior examination. Status post gastric sleeve resection.  Small hiatal hernia. Electronically Signed   By: Helyn Numbers M.D.   On: 06/06/2021 21:54   DG Chest 2  View  Result Date: 06/06/2021 CLINICAL DATA:  Shortness of breath. EXAM: CHEST - 2 VIEW COMPARISON:  04/11/2021 FINDINGS: Stable enlargement of the cardiopericardial silhouette. Asymmetric elevation of the right hemidiaphragm is unchanged. No pulmonary edema or pleural effusion. Insert no consolidation status post right shoulder replacement IMPRESSION: Stable exam. No acute cardiopulmonary findings. Electronically Signed   By: Kennith Center M.D.   On: 06/06/2021 18:40     Scheduled Meds:  apixaban  5 mg Oral BID   [START ON 06/08/2021] furosemide  40 mg Intravenous Daily   insulin aspart  0-15 Units Subcutaneous TID WC   [START ON 06/08/2021] levothyroxine  137 mcg Oral QAC breakfast   losartan  50 mg Oral Daily   pantoprazole  40 mg Oral Daily   rosuvastatin  20 mg Oral Daily   sodium chloride flush  3 mL Intravenous Q12H   Continuous Infusions:   LOS: 0 days    Time spent: 50 mins    Tajai Ihde, MD [facility]   If 7PM-7AM, please contact night-coverage

## 2021-06-07 NOTE — ED Notes (Signed)
OT at bedside with pt.

## 2021-06-07 NOTE — ED Notes (Signed)
Hospitalist at bedside 

## 2021-06-08 DIAGNOSIS — R0602 Shortness of breath: Secondary | ICD-10-CM | POA: Diagnosis not present

## 2021-06-08 LAB — CBC
HCT: 37.8 % (ref 36.0–46.0)
Hemoglobin: 10.4 g/dL — ABNORMAL LOW (ref 12.0–15.0)
MCH: 24.7 pg — ABNORMAL LOW (ref 26.0–34.0)
MCHC: 27.5 g/dL — ABNORMAL LOW (ref 30.0–36.0)
MCV: 89.8 fL (ref 80.0–100.0)
Platelets: 179 10*3/uL (ref 150–400)
RBC: 4.21 MIL/uL (ref 3.87–5.11)
RDW: 24.7 % — ABNORMAL HIGH (ref 11.5–15.5)
WBC: 4.4 10*3/uL (ref 4.0–10.5)
nRBC: 0 % (ref 0.0–0.2)

## 2021-06-08 LAB — PHOSPHORUS: Phosphorus: 4 mg/dL (ref 2.5–4.6)

## 2021-06-08 LAB — BASIC METABOLIC PANEL
Anion gap: 7 (ref 5–15)
BUN: 21 mg/dL (ref 8–23)
CO2: 27 mmol/L (ref 22–32)
Calcium: 8.5 mg/dL — ABNORMAL LOW (ref 8.9–10.3)
Chloride: 104 mmol/L (ref 98–111)
Creatinine, Ser: 1.06 mg/dL — ABNORMAL HIGH (ref 0.44–1.00)
GFR, Estimated: 56 mL/min — ABNORMAL LOW (ref >60)
Glucose, Bld: 127 mg/dL — ABNORMAL HIGH (ref 70–99)
Potassium: 4.6 mmol/L (ref 3.5–5.1)
Sodium: 138 mmol/L (ref 135–145)

## 2021-06-08 LAB — GLUCOSE, CAPILLARY
Glucose-Capillary: 109 mg/dL — ABNORMAL HIGH (ref 70–99)
Glucose-Capillary: 154 mg/dL — ABNORMAL HIGH (ref 70–99)
Glucose-Capillary: 183 mg/dL — ABNORMAL HIGH (ref 70–99)
Glucose-Capillary: 133 mg/dL — ABNORMAL HIGH (ref 70–99)

## 2021-06-08 LAB — MAGNESIUM: Magnesium: 1.9 mg/dL (ref 1.7–2.4)

## 2021-06-08 LAB — URINE CULTURE: Culture: NO GROWTH

## 2021-06-08 NOTE — Plan of Care (Signed)
?  Problem: Education: ?Goal: Ability to demonstrate management of disease process will improve ?Outcome: Progressing ?  ?Problem: Education: ?Goal: Ability to verbalize understanding of medication therapies will improve ?Outcome: Progressing ?  ?Problem: Education: ?Goal: Knowledge of General Education information will improve ?Description: Including pain rating scale, medication(s)/side effects and non-pharmacologic comfort measures ?Outcome: Progressing ?  ?Problem: Health Behavior/Discharge Planning: ?Goal: Ability to manage health-related needs will improve ?Outcome: Progressing ?  ?Problem: Clinical Measurements: ?Goal: Will remain free from infection ?Outcome: Progressing ?  ?Problem: Clinical Measurements: ?Goal: Diagnostic test results will improve ?Outcome: Progressing ?  ?Problem: Clinical Measurements: ?Goal: Respiratory complications will improve ?Outcome: Progressing ?  ?Problem: Clinical Measurements: ?Goal: Cardiovascular complication will be avoided ?Outcome: Progressing ?  ?

## 2021-06-08 NOTE — TOC Initial Note (Signed)
Transition of Care (TOC) - Initial/Assessment Note  ? ? ?Patient Details  ?Name: [name] ?MRN: [id number] ?Date of Birth: [birth year] ? ?Transition of Care (TOC) CM/SW Contact:    ?Marvie Brevik, [place], [place] ?Phone Number: ?06/08/2021, 2:37 PM ? ?Clinical Narrative:                 ?Patient contacted over the phone to discuss discharge plan. HH PT recommended post discharge from the hospital. Per patient, she has used [facility] in the past and is agreeable to using them again. Patient also receiving wound care through the the [place] [facility]. Transportation provided through [facility] in the past. It was recommended that patient contact her insurance company to discuss any available transportation benefits as well.  Patient states that she has a cain and walker at home, medications obtained through [facility]. Per patient, she has a housekeeper  that comes out 2x per month. ? ?[facility] contacted, voicemail left requesting a return call. ? ?[employer], LCSW ?Transition of Care ?[phone] ? ? ?Expected Discharge Plan: [place] ?Barriers to Discharge: Continued Medical Work up ? ? ?Patient Goals and CMS Choice ?  ?CMS Medicare.gov Compare Post Acute Care list provided to:: Patient ?  ? ?Expected Discharge Plan and Services ?Expected Discharge Plan: [place] ?In-house Referral: Clinical Social Work ?  ?  ?  ?                ?  ?  ?  ?  ?  ?  ?  ?  ?  ?  ? ?Prior Living Arrangements/Services ?  ?Lives with:: Spouse ?Patient language and need for interpreter reviewed:: No ?Do you feel safe going back to the place where you live?: Yes      ?Need for Family Participation in Patient Care: Yes (Comment) ?Care giver support system in place?: Yes (comment) ?  ?Criminal Activity/Legal Involvement Pertinent to Current Situation/Hospitalization: No - Comment as needed ? ?Activities of Daily Living ?Home Assistive Devices/Equipment: Gilford Rile (specify type), Contact lenses ?ADL  Screening (condition at time of admission) ?Patient's cognitive ability adequate to safely complete daily activities?: Yes ?Is the patient deaf or have difficulty hearing?: No ?Does the patient have difficulty seeing, even when wearing glasses/contacts?: Yes ?Does the patient have difficulty concentrating, remembering, or making decisions?: No ?Patient able to express need for assistance with ADLs?: Yes ?Does the patient have difficulty dressing or bathing?: No ?Independently performs ADLs?: Yes (appropriate for developmental age) ?Does the patient have difficulty walking or climbing stairs?: Yes ?Weakness of Legs: Left ?Weakness of Arms/Hands: Left ? ?Permission Sought/Granted ?  ?Permission granted to share information with : Yes, Verbal Permission Granted ?   ?   ? Permission granted to share info w Relationship: spouse Ladeja Pelham ? Permission granted to share info w Contact Information: [phone] ? ?Emotional Assessment ?  ?  ?  ?Orientation: : Oriented to Self, Oriented to Place, Oriented to  Time, Oriented to Situation ?Alcohol / Substance Use: Not Applicable ?Psych Involvement: No (comment) ? ?Admission diagnosis:  SOB (shortness of breath) [R06.02] ?Cellulitis of right lower extremity [L03.115] ?Acute on chronic congestive heart failure, unspecified heart failure type ([place]) [I50.9] ?Patient Active Problem List  ? Diagnosis Date Noted  ? SOB (shortness of breath) 06/06/2021  ? Iron deficiency anemia 03/27/2021  ? Recurrent strokes ([place]) 02/26/2021  ? Chronic shoulder pain (Bilateral) 02/10/2021  ? Impaired range of motion of shoulder (Left) 02/10/2021  ?  Acute pain of shoulder (Left) 02/10/2021  ? Hyperlipidemia LDL goal <70 01/28/2021  ? Allergic rhinitis 08/22/2020  ? Uncomplicated opioid dependence ([place]) 07/15/2020  ? Cognitive deficit, post-stroke 06/22/2020  ? Peripheral neurogenic pain 06/20/2020  ? Chronic peripheral neuropathic pain 06/20/2020  ? Chronic use of opiate for therapeutic purpose  06/20/2020  ? History of cerebrovascular accident (CVA) due to embolism 06/19/2020  ? History of stroke with current residual effects 06/05/2020  ? ANA positive 04/18/2020  ? Nonsustained ventricular tachycardia 04/09/2020  ? Persistent atrial fibrillation ([place]) 04/04/2020  ? Bedbound 03/29/2020  ? B12 deficiency 03/11/2020  ? Ulcer of left heel and midfoot ([place]) 03/11/2020  ? PAD (peripheral artery disease) ([place]) 03/11/2020  ? Rotator cuff disorder, left 03/01/2020  ? Depression, recurrent ([place]) 03/01/2020  ? Cerebrovascular accident (CVA) ([place]) 03/01/2020  ? Primary osteoarthritis of left shoulder 02/12/2020  ? Folate deficiency 02/08/2020  ? Vitamin D deficiency 02/08/2020  ? Malnutrition of moderate degree 02/06/2020  ? Stroke [facility]) 02/05/2020  ? Chronic shoulder pain (Left) 01/03/2020  ? Left hemiparesis ([place]) 12/28/2019  ? Hypertension associated with diabetes ([place]) 12/26/2019  ? Acute embolic stroke ([place]) 76/73/4193  ? Chronic bacteriuria 11/17/2019  ? Hyperkalemia 09/14/2019  ? Bacteremia due to Gram-positive bacteria 09/08/2019  ? Pressure injury of skin 09/04/2019  ? Elevated troponin 09/02/2019  ? COPD with acute exacerbation ([place]) 09/02/2019  ? Acute respiratory failure with hypoxia ([place]) 09/02/2019  ? Pharmacologic therapy 08/30/2019  ? Recurrent falls 07/10/2019  ? Left ankle pain 06/02/2019  ? Chronic pain of right ankle 02/14/2019  ? Arthritis 02/14/2019  ? Nausea and vomiting 02/14/2019  ? Meralgia paresthetica (Right) 12/05/2018  ? Chronic anticoagulation (Eliquis) 12/05/2018  ? Peripheral vascular disease ([place]) 10/18/2018  ? Chronic ulcer of right leg ([place]) 10/18/2018  ? Fibromyalgia syndrome 08/17/2018  ? Chronic musculoskeletal pain 08/17/2018  ? Tremor of right hand 08/03/2018  ? Overactive bladder 08/03/2018  ? Physical deconditioning 08/03/2018  ? Recurrent UTI 07/29/2018  ? Foot ulcer, left ([place]) 03/09/2018  ? Adrenal mass ([place]) 08/02/2017  ? Eczema 06/27/2017  ? Diabetic ulcer of left foot  ([place]) 06/24/2017  ? Mass of both adrenal glands ([place]) 05/16/2017  ? Fatty liver 05/13/2017  ? Hip strain (Right) 04/30/2017  ? Ischial pain, right 04/30/2017  ? Hip joint pain (Left) 04/22/2017  ? Abnormality of gait and mobility 04/22/2017  ? Muscle strain of hip (Left) 04/09/2017  ? Anemia 11/18/2016  ? Asthma 11/18/2016  ? Carpal tunnel syndrome 11/18/2016  ? Pain in joint involving ankle and foot 11/18/2016  ? Kidney stones 11/18/2016  ? Spinal stenosis of thoracic region 11/18/2016  ? Abscess of tendon of left foot 09/21/2016  ? Cellulitis 09/05/2016  ? Cubital tunnel syndrome (Right) 08/07/2016  ? Chronic pain syndrome 02/25/2016  ? Long term current use of opiate analgesic 02/25/2016  ? Chronic shoulder pain (Right) 02/25/2016  ? Lymphedema 12/26/2015  ? Status post reverse total shoulder replacement 10/08/2015  ? Neuropathy of lateral femoral cutaneous nerve (Right) 09/23/2015  ? Obesity, Class III, BMI 40-49.9 (morbid obesity) ([place]) 09/23/2015  ? Chronic prescription benzodiazepine use 09/23/2015  ? Arthralgia 09/23/2015  ? Osteoarthritis involving multiple joints 09/23/2015  ? Other shoulder lesions (Right) 08/28/2015  ? Cervical radiculitis 08/06/2015  ? Fall 05/22/2015  ? Opiate use (15 MME/Day) 01/16/2015  ? Impingement syndrome of shoulder region 01/07/2015  ? Mixed incontinence 11/26/2014  ? Atrophic vaginitis 11/26/2014  ? Depression with anxiety 09/21/2014  ? Bladder cystocele  09/21/2014  ? Gastro-esophageal reflux disease without esophagitis 09/21/2014  ? At high risk for falls 09/21/2014  ? DM (diabetes mellitus) type II, controlled, with peripheral vascular disorder ([place]) 08/31/2014  ? Diabetic peripheral neuropathy associated with type 2 diabetes mellitus ([place]) 08/31/2014  ? Asthma, well controlled 08/31/2014  ? Hypothyroidism 08/31/2014  ? Peripheral vascular disease due to secondary diabetes mellitus ([place]) 12/11/2013  ? OSA (obstructive sleep apnea) 10/19/2013  ? AF (paroxysmal atrial  fibrillation) ([place]) 07/27/2013  ? Essential hypertension 07/27/2013  ? Chronic atrial fibrillation 08/10/2012  ? ?PCP:  [name], [name], MD ?Pharmacy:   ?[place], [place] - [address]

## 2021-06-08 NOTE — Evaluation (Signed)
Physical Therapy Evaluation ?Patient Details ?Name: [name] ?MRN: [id number] ?DOB: 07/21/1950 ?Today's Date: 06/08/2021 ? ?History of Present Illness ? [name] is a 71 y.o. adult with a past history of diabetes, atrial fibrillation, CHF who is sent to the ED from cardiology clinic due to worsening leg swelling, dyspnea on exertion, orthopnea over the past few months.  Reportedly had a 35 pound weight gain over the last few months.  She was seen in cardiology clinic, tried escalating doses of diuretics without relief.   Patient admitted for shortness of breath/acute respiratory failure with hypoxia/ CHF. She also has Afib/hx of CVA and reccurent UTI, Rt LE cellulitis.  ?Clinical Impression ? Pt seen for PT evaluation with pt reporting she doesn't want to get OOB but is agreeable to do so. Pt reports she has been ambulatory with RW & progressing to using QC but her husband prefers her to use a w/c at all times as he's fearful of her falling. Pt is limited by chronic LUE shoulder pain as pt reports she needs a shoulder replacement. Pt is able to complete bed mobility with min assist to upright trunk & close supervision for STS & stand pivot to recliner with RW. PT then observed pt's IV to be bleeding & nurse called to room to assess. Anticipate pt can mobilize well & will continue to follow pt acutely to progress gait with LRAD. ?   ? ?Recommendations for follow up therapy are one component of a multi-disciplinary discharge planning process, led by the attending physician.  Recommendations may be updated based on patient status, additional functional criteria and insurance authorization. ? ?Follow Up Recommendations Home health PT ? ?  ?Assistance Recommended at Discharge Frequent or constant Supervision/Assistance  ?Patient can return home with the following ? A little help with walking and/or transfers;A little help with bathing/dressing/bathroom;Assistance with cooking/housework;Direct  supervision/assist for financial management;Assist for transportation;Direct supervision/assist for medications management;Help with stairs or ramp for entrance ? ?  ?Equipment Recommendations    ?Recommendations for Other Services ?    ?  ?Functional Status Assessment Patient has had a recent decline in their functional status and demonstrates the ability to make significant improvements in function in a reasonable and predictable amount of time.  ? ?  ?Precautions / Restrictions Precautions ?Precautions: Fall ?Restrictions ?Weight Bearing Restrictions: No  ? ?  ? ?Mobility ? Bed Mobility ?Overal bed mobility: Needs Assistance ?Bed Mobility: Supine to Sit ?  ?  ?Supine to sit: Min assist, HOB elevated ?  ?  ?General bed mobility comments: use of bed rails, assistance to upright trunk as pt with limited use of LUE 2/2 chronic shoulder issues ?  ? ?Transfers ?Overall transfer level: Needs assistance ?Equipment used: Rolling walker (2 wheels) ?Transfers: Sit to/from Stand, Bed to chair/wheelchair/BSC ?Sit to Stand: Supervision ?  ?Step pivot transfers: Supervision ?  ?  ?  ?  ?  ? ?Ambulation/Gait ?  ?  ?  ?  ?  ?  ?  ?  ? ?Stairs ?  ?  ?  ?  ?  ? ?Wheelchair Mobility ?  ? ?Modified Rankin (Stroke Patients Only) ?  ? ?  ? ?Balance Overall balance assessment: Needs assistance ?Sitting-balance support: No upper extremity supported, Feet supported ?Sitting balance-Leahy Scale: Fair ?  ?  ?Standing balance support: During functional activity, Reliant on assistive device for balance, Bilateral upper extremity supported ?Standing balance-Leahy Scale: Fair ?  ?  ?  ?  ?  ?  ?  ?  ?  ?  ?  ?  ?   ? ? ? ?  Pertinent Vitals/Pain Pain Assessment ?Pain Assessment: Faces ?Faces Pain Scale: Hurts little more ?Pain Location: L shoulder (pt reports she needs a shoulder replacement) ?Pain Descriptors / Indicators: Discomfort, Grimacing, Guarding ?Pain Intervention(s): Monitored during session, Repositioned  ? ? ?Home Living  Family/patient expects to be discharged to:: Private residence ?Living Arrangements: Spouse/significant other ?Available Help at Discharge: Family;Available 24 hours/day ?Type of Home: House ?Home Access: Ramped entrance ?  ?  ?  ?Home Layout: One level ?Home Equipment: Conservation officer, nature (2 wheels);Cane - quad;BSC/3in1;Tub bench;Wheelchair - manual ?   ?  ?Prior Function Prior Level of Function : Needs assist ?  ?  ?  ?  ?  ?  ?Mobility Comments: SBA + RW for household mobility, progressing to QC use at home, husband provides chair follow 2/2 fall risk. ?ADLs Comments: assist for washing bottom in shower only ?  ? ? ?Hand Dominance  ?   ? ?  ?Extremity/Trunk Assessment  ? Upper Extremity Assessment ?Upper Extremity Assessment: LUE deficits/detail ?LUE Deficits / Details: hx of rotator cuff injury, limited AROM ?  ? ?Lower Extremity Assessment ?Lower Extremity Assessment: Generalized weakness ?BLE: BLE erythema around ankles & feet but pt reports edema has much improved ?  ? ?   ?Communication  ? Communication: No difficulties  ?Cognition Arousal/Alertness: Awake/alert ?Behavior During Therapy: [facility] for tasks assessed/performed ?Overall Cognitive Status: Within Functional Limits for tasks assessed ?  ?  ?  ?  ?  ?  ?  ?  ?  ?  ?  ?  ?  ?  ?  ?  ?General Comments: Pleasant lady, recalls working with PT, willing to get out of bed despite pt reporting she does not feel like it. ?  ?  ? ?  ?General Comments   ? ?  ?Exercises    ? ?Assessment/Plan  ?  ?PT Assessment Patient needs continued PT services  ?PT Problem List Decreased strength;Decreased mobility;Decreased safety awareness;Decreased activity tolerance;Cardiopulmonary status limiting activity;Decreased balance;Pain ? ?   ?  ?PT Treatment Interventions DME instruction;Therapeutic exercise;Gait training;Balance training;Stair training;Neuromuscular re-education;Functional mobility training;Therapeutic activities;Patient/family education;Modalities;Manual techniques    ? ?PT Goals (Current goals can be found in the Care Plan section)  ?Acute Rehab PT Goals ?Patient Stated Goal: get better ?PT Goal Formulation: With patient ?Time For Goal Achievement: 06/22/21 ?Potential to Achieve Goals: Good ? ?  ?Frequency Min 2X/week ?  ? ? ?Co-evaluation   ?  ?  ?  ?  ? ? ?  ?AM-PAC PT "6 Clicks" Mobility  ?Outcome Measure Help needed turning from your back to your side while in a flat bed without using bedrails?: A Little ?Help needed moving from lying on your back to sitting on the side of a flat bed without using bedrails?: A Lot ?Help needed moving to and from a bed to a chair (including a wheelchair)?: A Little ?Help needed standing up from a chair using your arms (e.g., wheelchair or bedside chair)?: A Little ?Help needed to walk in hospital room?: A Little ?Help needed climbing 3-5 steps with a railing? : A Lot ?6 Click Score: 16 ? ?  ?End of Session   ?Activity Tolerance: Patient tolerated treatment well (limited by need for nursing care 2/2 bleeding IV) ?Patient left: in chair;with nursing/sitter in room ?Nurse Communication: Mobility status ?PT Visit Diagnosis: Difficulty in walking, not elsewhere classified (R26.2);Muscle weakness (generalized) (M62.81);Unsteadiness on feet (R26.81) ?  ? ?Time: 2248-2500 ?PT Time Calculation (min) (ACUTE ONLY): 21 min ? ? ?Charges:  PT Evaluation ?$PT Eval Moderate Complexity: 1 Mod ?  ?  ?   ? ? ?Lavone Nian, PT, DPT ?06/08/21, 1:57 PM ? ? ?Waunita Schooner ?06/08/2021, 1:55 PM ? ?

## 2021-06-08 NOTE — Progress Notes (Signed)
?PROGRESS NOTE ? ? ? ?[name]  LZJ:[id number] DOB: 1951/03/01 DOA: 06/06/2021 ? ?PCP: [name], [name], MD  ? ? ?Brief Narrative:  ?This 71 years old female with PMH significant for hypertension, paroxysmal A-fib on chronic anticoagulation, obstructive sleep apnea, diabetes mellitus, hypothyroidism, GERD, recurrent UTI, history of stroke, morbid obesity presented to the ED with multiple complaints.  She has chronic recurrent UTI and recently completed antibiotic course.  Patient is admitted for possible LE cellulitis and acute hypoxic respiratory failure secondary to CHF. ? ?Assessment & Plan: ?  ?Principal Problem: ?  SOB (shortness of breath) ?Active Problems: ?  Chronic anticoagulation (Eliquis) ?  AF (paroxysmal atrial fibrillation) ([place]) ?  Essential hypertension ?  OSA (obstructive sleep apnea) ?  DM (diabetes mellitus) type II, controlled, with peripheral vascular disorder ([place]) ?  Hypothyroidism ?  Gastro-esophageal reflux disease without esophagitis ?  Cellulitis ?  Anemia ?  Recurrent UTI ?  Acute respiratory failure with hypoxia ([place]) ?  Stroke [facility]) ? ?Acute hypoxic respiratory failure sec. to Acute on chronic diastolic CHF: ?She presented with shortness of breath, hypoxia SPO2 87% on RA, improved to 100% on 2 L. ?Most recent echo 3/23 shows LVEF 55 to 60%.  No RWMA. ?Patient received Lasix 80 mg in the ED. ?Continue Lasix IV 40 mg daily.  Continue losartan 50 mg daily ?Monitor daily weight, intake output charting. ?She is successfully weaned down to room air. ? ? ?Intake/Output Summary (Last 24 hours) at 06/08/2021 1051 ?Last data filed at 06/08/2021 0600 ?Gross per 24 hour  ?Intake 100 ml  ?Output 950 ml  ?Net -850 ml  ?  ?Atrial fibrillation: ?Heart rate well controlled. ?Metoprolol is on hold because of bradycardia. ?Continue Eliquis for anticoagulation ? ?Recurrent UTI: ?Patient recently completed a course of antibiotics ?UA unremarkable , urine cultures no growth. ? ?Bilateral lower  extremity cellulitis: ?Lower extremities appears erythematous,  warm and tender. ?Continue vancomycin for now , follow-up cultures. ? ?Type 2 diabetes: ?Continue regular insulin sliding scale. ?Hold p.o. diabetic medications ? ?Hypothyroidism: ?Continue  levothyroxine ? ?GERD: ?Continue pantoprazole ? ?Obstructive sleep apnea: ?CPAP at night. ? ?  ?Morbid obesity: ?Body mass index is 43.91 kg/m?. ?Diet and exercise discussed in detail. ? ?DVT prophylaxis: Eliquis ?Code Status: Full code ?Family Communication: No family at bedside ?Disposition Plan:  ?Status is: Inpatient ?Remains inpatient appropriate because: Admitted for acute hypoxic respiratory failure secondary to CHF exacerbation and bilateral leg cellulitis, requiring IV antibiotics. ?  ?PT recommended home PT.  Anticipated discharge home in 1 to 2 days. ? ?Consultants:  ?None ? ?Procedures: None ?Antimicrobials:  ?Anti-infectives (From admission, onward)  ? ? Start     Dose/Rate Route Frequency Ordered Stop  ? 06/07/21 1515  vancomycin (VANCOREADY) IVPB 1500 mg/300 mL       ? 1,500 mg ?150 mL/hr over 120 Minutes Intravenous Every 24 hours 06/07/21 1504    ? 06/06/21 2000  vancomycin (VANCOCIN) IVPB 1000 mg/200 mL premix  Status:  Discontinued       ? 1,000 mg ?200 mL/hr over 60 Minutes Intravenous  Once 06/06/21 1953 06/06/21 1957  ? 06/06/21 2000  vancomycin (VANCOCIN) IVPB 1000 mg/200 mL premix       ?See Hyperspace for full Linked Orders Report.  ? 1,000 mg ?200 mL/hr over 60 Minutes Intravenous  Once 06/06/21 1957 06/06/21 2238  ? 06/06/21 2000  vancomycin (VANCOREADY) IVPB 1500 mg/300 mL       ?See Hyperspace for full Linked Orders Report.  ?  1,500 mg ?150 mL/hr over 120 Minutes Intravenous  Once 06/06/21 1957 06/07/21 0111  ? ?  ?  ?Subjective: ?Patient was seen and examined at bedside.  Overnight events noted.   ?Patient is lying comfortably on the bed, wean down to room air.  SPO2 93% on room air ?She denies any chest pain, dizziness,  palpitations. ? ?Objective: ?Vitals:  ? 06/07/21 1859 06/07/21 2346 06/08/21 0445 06/08/21 0852  ?BP: (!) 142/69 132/88 127/60 129/70  ?Pulse: 64 63 60 62  ?Resp:  '20 20 18  '$ ?Temp: 98.4 ?F (36.9 ?C) 98 ?F (36.7 ?C) 98 ?F (36.7 ?C) 98.2 ?F (36.8 ?C)  ?TempSrc: Oral     ?SpO2: 94% 93% 92% 93%  ?Weight:   123.4 kg   ?Height:      ? ? ?Intake/Output Summary (Last 24 hours) at 06/08/2021 1051 ?Last data filed at 06/08/2021 0600 ?Gross per 24 hour  ?Intake 100 ml  ?Output 950 ml  ?Net -850 ml  ? ?Filed Weights  ? 06/06/21 2107 06/08/21 0445  ?Weight: 126.1 kg 123.4 kg  ? ? ?Examination: ? ?General exam: Appears comfortable, not in any acute distress.  Deconditioned ?Respiratory system: CTA bilaterally, no wheezing, no crackles, normal respiratory effort.  Cardiovascular system: S1 & S2 heard, irregular rhythm, no murmur.Marland Kitchen ?Gastrointestinal system: Abdomen is soft, non distended, non tender, BS + ?Central nervous system: Alert and oriented x 3. No focal neurological deficits. ?Extremities: No edema, both lower extremities erythematous, warm,tender. ?Skin: No rashes, lesions or ulcers ?Psychiatry: Judgement and insight appear normal. Mood & affect appropriate.  ? ? ? ?Data Reviewed: I have personally reviewed following labs and imaging studies ? ?CBC: ?Recent Labs  ?Lab 06/06/21 ?1804 06/08/21 ?0338  ?WBC 6.7 4.4  ?HGB 10.4* 10.4*  ?HCT 35.9* 37.8  ?MCV 85.7 89.8  ?PLT 183 179  ? ?Basic Metabolic Panel: ?Recent Labs  ?Lab 06/06/21 ?1804 06/08/21 ?0338  ?NA 140 138  ?K 4.7 4.6  ?CL 103 104  ?CO2 31 27  ?GLUCOSE 128* 127*  ?BUN 23 21  ?CREATININE 1.15* 1.06*  ?CALCIUM 8.5* 8.5*  ?MG 2.0 1.9  ?PHOS  --  4.0  ? ?GFR: ?Estimated Creatinine Clearance (by C-G formula based on SCr of 1.06 mg/dL (H)) ?Female: 65.2 mL/min (A) ?Female: 79.2 mL/min (A) ?Liver Function Tests: ?No results for input(s): AST, ALT, ALKPHOS, BILITOT, PROT, ALBUMIN in the last 168 hours. ?No results for input(s): LIPASE, AMYLASE in the last 168 hours. ?No results  for input(s): AMMONIA in the last 168 hours. ?Coagulation Profile: ?No results for input(s): INR, PROTIME in the last 168 hours. ?Cardiac Enzymes: ?No results for input(s): CKTOTAL, CKMB, CKMBINDEX, TROPONINI in the last 168 hours. ?BNP (last 3 results) ?No results for input(s): PROBNP in the last 8760 hours. ?HbA1C: ?No results for input(s): HGBA1C in the last 72 hours. ?CBG: ?Recent Labs  ?Lab 06/07/21 ?0825 06/07/21 ?1237 06/07/21 ?1617 06/07/21 ?1930 06/08/21 ?5701  ?GLUCAP 105* 187* 225* 139* 109*  ? ?Lipid Profile: ?No results for input(s): CHOL, HDL, LDLCALC, TRIG, CHOLHDL, LDLDIRECT in the last 72 hours. ?Thyroid Function Tests: ?No results for input(s): TSH, T4TOTAL, FREET4, T3FREE, THYROIDAB in the last 72 hours. ?Anemia Panel: ?No results for input(s): VITAMINB12, FOLATE, FERRITIN, TIBC, IRON, RETICCTPCT in the last 72 hours. ?Sepsis Labs: ?Recent Labs  ?Lab 06/07/21 ?1520  ?PROCALCITON <0.10  ? ? ?Recent Results (from the past 240 hour(s))  ?Urine Culture     Status: Abnormal  ? Collection Time: 05/30/21 11:49 AM  ?  Specimen: Urine  ?Result Value Ref Range Status  ? MICRO NUMBER: [id number]  Final  ? SPECIMEN QUALITY: Adequate  Final  ? Sample Source NOT GIVEN  Final  ? STATUS: FINAL  Final  ? ISOLATE 1: Klebsiella pneumoniae (A)  Final  ?  Comment: Greater than 100,000 CFU/mL of Klebsiella pneumoniae  ?    Susceptibility  ? Klebsiella pneumoniae - URINE CULTURE, REFLEX  ?  AMOX/CLAVULANIC <=2 Sensitive   ?  AMPICILLIN >=32 Resistant   ?  AMPICILLIN/SULBACTAM 8 Sensitive   ?  CEFAZOLIN* <=4 Not Reportable   ?   * For infections other than uncomplicated UTI ?caused by E. coli, K. pneumoniae or P. mirabilis: ?Cefazolin is resistant if MIC > or = 8 mcg/mL. ?(Distinguishing susceptible versus intermediate ?for isolates with MIC < or = 4 mcg/mL requires ?additional testing.) ?For uncomplicated UTI caused by E. coli, ?K. pneumoniae or P. mirabilis: Cefazolin is ?susceptible if MIC <32 mcg/mL and  predicts ?susceptible to the oral agents cefaclor, cefdinir, ?cefpodoxime, cefprozil, cefuroxime, cephalexin ?and loracarbef. ?  ?  CEFTAZIDIME <=1 Sensitive   ?  CEFEPIME <=1 Sensitive   ?  CEFTRIAXONE <=1 Sensitive   ?  CIPROFLOXAC

## 2021-06-09 ENCOUNTER — Ambulatory Visit: Payer: Medicare HMO

## 2021-06-09 ENCOUNTER — Telehealth: Payer: Self-pay | Admitting: Internal Medicine

## 2021-06-09 DIAGNOSIS — R0602 Shortness of breath: Secondary | ICD-10-CM | POA: Diagnosis not present

## 2021-06-09 LAB — BASIC METABOLIC PANEL
Anion gap: 5 (ref 5–15)
BUN: 27 mg/dL — ABNORMAL HIGH (ref 8–23)
CO2: 31 mmol/L (ref 22–32)
Calcium: 8.5 mg/dL — ABNORMAL LOW (ref 8.9–10.3)
Chloride: 102 mmol/L (ref 98–111)
Creatinine, Ser: 1.38 mg/dL — ABNORMAL HIGH (ref 0.44–1.00)
GFR, Estimated: 41 mL/min — ABNORMAL LOW (ref >60)
Glucose, Bld: 141 mg/dL — ABNORMAL HIGH (ref 70–99)
Potassium: 4.1 mmol/L (ref 3.5–5.1)
Sodium: 138 mmol/L (ref 135–145)

## 2021-06-09 LAB — GLUCOSE, CAPILLARY
Glucose-Capillary: 139 mg/dL — ABNORMAL HIGH (ref 70–99)
Glucose-Capillary: 127 mg/dL — ABNORMAL HIGH (ref 70–99)
Glucose-Capillary: 165 mg/dL — ABNORMAL HIGH (ref 70–99)
Glucose-Capillary: 132 mg/dL — ABNORMAL HIGH (ref 70–99)
Glucose-Capillary: 201 mg/dL — ABNORMAL HIGH (ref 70–99)

## 2021-06-09 LAB — HEMOGLOBIN A1C
Hgb A1c MFr Bld: 6.9 % — ABNORMAL HIGH (ref 4.8–5.6)
Mean Plasma Glucose: 151 mg/dL

## 2021-06-09 MED ORDER — VANCOMYCIN VARIABLE DOSE PER UNSTABLE RENAL FUNCTION (PHARMACIST DOSING): Status: DC

## 2021-06-09 MED ORDER — DULOXETINE HCL 30 MG PO CPEP
60.0000 mg | ORAL_CAPSULE | Freq: Every day | ORAL | Status: DC
  Administered 2021-06-09 – 2021-06-10 (×2): 60 mg via ORAL
  Filled 2021-06-09 (×2): qty 2

## 2021-06-09 MED ORDER — FUROSEMIDE 40 MG PO TABS
40.0000 mg | ORAL_TABLET | Freq: Every day | ORAL | Status: DC
  Administered 2021-06-09 – 2021-06-10 (×2): 40 mg via ORAL
  Filled 2021-06-09 (×2): qty 1

## 2021-06-09 MED ORDER — BUPROPION HCL ER (XL) 150 MG PO TB24
150.0000 mg | ORAL_TABLET | Freq: Every day | ORAL | Status: DC
  Administered 2021-06-09 – 2021-06-10 (×2): 150 mg via ORAL
  Filled 2021-06-09 (×2): qty 1

## 2021-06-09 MED ORDER — MONTELUKAST SODIUM 10 MG PO TABS
10.0000 mg | ORAL_TABLET | Freq: Every day | ORAL | Status: DC
  Administered 2021-06-09: 10 mg via ORAL
  Filled 2021-06-09: qty 1

## 2021-06-09 MED ORDER — PREGABALIN 50 MG PO CAPS
50.0000 mg | ORAL_CAPSULE | Freq: Four times a day (QID) | ORAL | Status: DC
  Administered 2021-06-09 – 2021-06-10 (×4): 50 mg via ORAL
  Filled 2021-06-09 (×4): qty 1

## 2021-06-09 NOTE — Progress Notes (Signed)
?PROGRESS NOTE ? ? ? ?[name]  GQQ:[id number] DOB: [birth year] DOA: 06/06/2021 ? ?PCP: [name], [name], MD  ? ? ?Brief Narrative:  ?This 71 years old female with PMH significant for hypertension, paroxysmal A-fib on chronic anticoagulation, obstructive sleep apnea, diabetes mellitus, hypothyroidism, GERD, recurrent UTI, history of stroke, morbid obesity presented to the ED with multiple complaints.  She has chronic recurrent UTI and recently completed antibiotic course.  Patient is admitted for possible LE cellulitis and acute hypoxic respiratory failure secondary to CHF. ? ?Assessment & Plan: ?  ?Principal Problem: ?  SOB (shortness of breath) ?Active Problems: ?  Chronic anticoagulation ([place]) ?  AF (paroxysmal atrial fibrillation) ([place]) ?  Essential hypertension ?  OSA (obstructive sleep apnea) ?  DM (diabetes mellitus) type II, controlled, with peripheral vascular disorder ([place]) ?  Hypothyroidism ?  Gastro-esophageal reflux disease without esophagitis ?  Cellulitis ?  Anemia ?  Recurrent UTI ?  Acute respiratory failure with hypoxia ([place]) ?  Stroke [facility]) ? ?Acute hypoxic respiratory failure sec. to Acute on chronic diastolic CHF: ?She presented with shortness of breath, hypoxia SPO2 87% on RA, improved to 100% on 2 L. ?Most recent echo 3/23 shows LVEF 55 to 60%.  No RWMA. ?Patient received Lasix 80 mg in the ED. ?Continued Lasix IV 40 mg daily.  Continue losartan 50 mg daily ?Monitor daily weight, intake output charting. ?She is successfully weaned down to room air. ?Her weight is down from 126Kgs - >121.6Kgs ? ? ?Intake/Output Summary (Last 24 hours) at 06/09/2021 1105 ?Last data filed at 06/09/2021 0600 ?Gross per 24 hour  ?Intake 1200 ml  ?Output 2600 ml  ?Net -1400 ml  ?  ?Atrial fibrillation: ?Heart rate well controlled. ?Metoprolol was on hold because of bradycardia. ?Continue Eliquis for anticoagulation. ?Resume metoprolol when able to. ? ?Recurrent UTI: ?Patient recently completed a course  of antibiotics ?UA unremarkable , urine cultures no growth. ? ?Bilateral lower extremity cellulitis: ?Lower extremities appears erythematous,  warm and tender. ?Continue vancomycin for now , follow-up cultures. ?Cellulitis has significantly improved. ? ?Type 2 diabetes: ?Continue regular insulin sliding scale. ?Hold p.o. diabetic medications ? ?Hypothyroidism: ?Continue  levothyroxine ? ?GERD: ?Continue pantoprazole ? ?Obstructive sleep apnea: ?CPAP at night. ? ?  ?Morbid obesity: ?Body mass index is 43.91 kg/m?. ?Diet and exercise discussed in detail. ? ?DVT prophylaxis: Eliquis ?Code Status: Full code ?Family Communication: No family at bedside ?Disposition Plan:  ?Status is: Inpatient ?Remains inpatient appropriate because: Admitted for acute hypoxic respiratory failure secondary to CHF exacerbation and bilateral leg cellulitis, requiring IV antibiotics. ?  ?PT recommended home PT. anticipated discharge home with home PT on 06/10/2021. ? ?Consultants:  ?None ? ?Procedures: None ?Antimicrobials:  ?Anti-infectives (From admission, onward)  ? ? Start     Dose/Rate Route Frequency Ordered Stop  ? 06/09/21 0747  vancomycin variable dose per unstable renal function (pharmacist dosing)       ?  Does not apply See admin instructions 06/09/21 0748    ? 06/07/21 1515  vancomycin (VANCOREADY) IVPB 1500 mg/300 mL  Status:  Discontinued       ? 1,500 mg ?150 mL/hr over 120 Minutes Intravenous Every 24 hours 06/07/21 1504 06/09/21 0747  ? 06/06/21 2000  vancomycin (VANCOCIN) IVPB 1000 mg/200 mL premix  Status:  Discontinued       ? 1,000 mg ?200 mL/hr over 60 Minutes Intravenous  Once 06/06/21 1953 06/06/21 1957  ? 06/06/21 2000  vancomycin (VANCOCIN) IVPB 1000 mg/200 mL premix       ?  See Hyperspace for full Linked Orders Report.  ? 1,000 mg ?200 mL/hr over 60 Minutes Intravenous  Once 06/06/21 1957 06/06/21 2238  ? 06/06/21 2000  vancomycin (VANCOREADY) IVPB 1500 mg/300 mL       ?See Hyperspace for full Linked Orders Report.   ? 1,500 mg ?150 mL/hr over 120 Minutes Intravenous  Once 06/06/21 1957 06/07/21 0111  ? ?  ?  ?Subjective: ?Patient was seen and examined at bedside.  Overnight events noted.  ?Patient is lying comfortably in the bed, reports feeling tired but otherwise doing much improved. ?She denies any chest pain, dizziness, palpitations.  SPO2 96% on room air ? ? ?Objective: ?Vitals:  ? 06/08/21 2312 06/09/21 0019 06/09/21 0507 06/09/21 0743  ?BP: 132/65  (!) 104/51 (!) 135/56  ?Pulse: 76  68 73  ?Resp: '18  18 18  '$ ?Temp: 97.9 ?F (36.6 ?C)  98.2 ?F (36.8 ?C) 98.2 ?F (36.8 ?C)  ?TempSrc:      ?SpO2: 90%  91% 92%  ?Weight:  121.6 kg    ?Height:      ? ? ?Intake/Output Summary (Last 24 hours) at 06/09/2021 1105 ?Last data filed at 06/09/2021 0600 ?Gross per 24 hour  ?Intake 1200 ml  ?Output 2600 ml  ?Net -1400 ml  ? ?Filed Weights  ? 06/06/21 2107 06/08/21 0445 06/09/21 0019  ?Weight: 126.1 kg 123.4 kg 121.6 kg  ? ? ?Examination: ? ?General exam: Appears comfortable, not in any distress, Deconditioned. ?Respiratory system: CTA bilaterally, no wheezing, no crackles, normal respiratory effort.   ?Cardiovascular system: S1 & S2 heard, irregular rhythm, no murmur.Marland Kitchen ?Gastrointestinal system: Abdomen is soft, non distended, non tender, BS + ?Central nervous system: Alert and oriented x 3. No focal neurological deficits. ?Extremities: No edema, LE erythema much improved. No cyanosis. ?Skin: No rashes, lesions or ulcers ?Psychiatry: Judgement and insight appear normal. Mood & affect appropriate.  ? ? ? ?Data Reviewed: I have personally reviewed following labs and imaging studies ? ?CBC: ?Recent Labs  ?Lab 06/06/21 ?1804 06/08/21 ?0338  ?WBC 6.7 4.4  ?HGB 10.4* 10.4*  ?HCT 35.9* 37.8  ?MCV 85.7 89.8  ?PLT 183 179  ? ?Basic Metabolic Panel: ?Recent Labs  ?Lab 06/06/21 ?1804 06/08/21 ?0338 06/09/21 ?0440  ?NA 140 138 138  ?K 4.7 4.6 4.1  ?CL 103 104 102  ?CO2 '31 27 31  '$ ?GLUCOSE 128* 127* 141*  ?BUN 23 21 27*  ?CREATININE 1.15* 1.06* 1.38*   ?CALCIUM 8.5* 8.5* 8.5*  ?MG 2.0 1.9  --   ?PHOS  --  4.0  --   ? ?GFR: ?Estimated Creatinine Clearance (by C-G formula based on SCr of 1.38 mg/dL (H)) ?Female: 49.7 mL/min (A) ?Female: 60.3 mL/min (A) ?Liver Function Tests: ?No results for input(s): AST, ALT, ALKPHOS, BILITOT, PROT, ALBUMIN in the last 168 hours. ?No results for input(s): LIPASE, AMYLASE in the last 168 hours. ?No results for input(s): AMMONIA in the last 168 hours. ?Coagulation Profile: ?No results for input(s): INR, PROTIME in the last 168 hours. ?Cardiac Enzymes: ?No results for input(s): CKTOTAL, CKMB, CKMBINDEX, TROPONINI in the last 168 hours. ?BNP (last 3 results) ?No results for input(s): PROBNP in the last 8760 hours. ?HbA1C: ?Recent Labs  ?  06/07/21 ?1520  ?HGBA1C 6.9*  ? ?CBG: ?Recent Labs  ?Lab 06/08/21 ?1209 06/08/21 ?1646 06/08/21 ?2326 06/09/21 ?7341 06/09/21 ?0744  ?GLUCAP 154* 183* 133* 139* 127*  ? ?Lipid Profile: ?No results for input(s): CHOL, HDL, LDLCALC, TRIG, CHOLHDL, LDLDIRECT in the last 72 hours. ?Thyroid  Function Tests: ?No results for input(s): TSH, T4TOTAL, FREET4, T3FREE, THYROIDAB in the last 72 hours. ?Anemia Panel: ?No results for input(s): VITAMINB12, FOLATE, FERRITIN, TIBC, IRON, RETICCTPCT in the last 72 hours. ?Sepsis Labs: ?Recent Labs  ?Lab 06/07/21 ?1520  ?PROCALCITON <0.10  ? ? ?Recent Results (from the past 240 hour(s))  ?Urine Culture     Status: Abnormal  ? Collection Time: 05/30/21 11:49 AM  ? Specimen: Urine  ?Result Value Ref Range Status  ? MICRO NUMBER: [id number]  Final  ? SPECIMEN QUALITY: Adequate  Final  ? Sample Source NOT GIVEN  Final  ? STATUS: FINAL  Final  ? ISOLATE 1: Klebsiella pneumoniae (A)  Final  ?  Comment: Greater than 100,000 CFU/mL of Klebsiella pneumoniae  ?    Susceptibility  ? Klebsiella pneumoniae - URINE CULTURE, REFLEX  ?  AMOX/CLAVULANIC <=2 Sensitive   ?  AMPICILLIN >=32 Resistant   ?  AMPICILLIN/SULBACTAM 8 Sensitive   ?  CEFAZOLIN* <=4 Not Reportable   ?   * For infections  other than uncomplicated UTI ?caused by E. coli, K. pneumoniae or P. mirabilis: ?Cefazolin is resistant if MIC > or = 8 mcg/mL. ?(Distinguishing susceptible versus intermediate ?for isolates with MIC < o

## 2021-06-09 NOTE — TOC Progression Note (Addendum)
Transition of Care (TOC) - Progression Note  ? ? ?Patient Details  ?Name: [name] ?MRN: [id number] ?Date of Birth: [birth year] ? ?Transition of Care (TOC) CM/SW Contact  ?Kerin Salen, RN ?Phone Number: ?06/09/2021, 1:04 PM ? ?Clinical Narrative:  TOCRN text [facility] to arrange PT/OT, they were unable to provide service due to staffing. TOC will attempt other agencies.  ?[facility] returned text, they will be able to provide services in 6-7days, Attending notified, reply pending. ?4pm: [name] from [facility] says they will resume [facility] services with patient, RN/PT/OT. ? ? ? ?Expected Discharge Plan: [place] ?Barriers to Discharge: Continued Medical Work up ? ?Expected Discharge Plan and Services ?Expected Discharge Plan: [place] ?In-house Referral: Clinical Social Work ?  ?  ?  ?                ?  ?  ?  ?  ?  ?  ?  ?  ?  ?  ? ? ?Social Determinants of Health (SDOH) Interventions ?  ? ?Readmission Risk Interventions ?No flowsheet data found. ? ?

## 2021-06-09 NOTE — Progress Notes (Signed)
Mobility Specialist - Progress Note ? ? 06/09/21 1500  ?Mobility  ?Activity Dangled on edge of bed;Stood at bedside;Ambulated with assistance in room  ?Level of Assistance Standby assist, set-up cues, supervision of patient - no hands on  ?Assistive Device Front wheel walker  ?Distance Ambulated (ft) 4 ft  ?Activity Response Tolerated well  ?$Mobility charge 1 Mobility  ? ? ?Pt lying in bed upon arrival, utilizing RA. MinA and extra time to achieve EOB d/t limited use of LUE---pain medication given prior to session per RN; supervision and entra time to stand x2 from standard bed height with use of RW. Pt ambulated a few ft forward when CSW entered to discuss d/c disposition. Pt returned EOB upon OT arrival.  ? ? ?[name] ?Mobility Specialist ?06/09/21, 3:42 PM ? ? ? ? ?

## 2021-06-09 NOTE — Progress Notes (Signed)
Nutrition Brief Note ? ?RD consulted for assessment of nutrition requirements/ status.  ? ?Wt Readings from Last 15 Encounters:  ?06/09/21 121.6 kg  ?05/27/21 125.2 kg  ?05/12/21 118.4 kg  ?04/29/21 118.5 kg  ?04/11/21 122 kg  ?03/25/21 122.5 kg  ?03/20/21 108.9 kg  ?02/10/21 115.7 kg  ?02/06/21 115.7 kg  ?01/28/21 115.7 kg  ?01/15/21 115.7 kg  ?12/06/20 117.9 kg  ?11/13/20 117.9 kg  ?10/15/20 118.8 kg  ?08/22/20 107 kg  ? ?This 71 years old female with PMH significant for hypertension, paroxysmal A-fib on chronic anticoagulation, obstructive sleep apnea, diabetes mellitus, hypothyroidism, GERD, recurrent UTI, history of stroke, morbid obesity presented to the ED with multiple complaints.  She has chronic recurrent UTI and recently completed antibiotic course.  Patient is admitted for possible LE cellulitis and acute hypoxic respiratory failure secondary to CHF. ? ?Pt admitted with CHF.  ? ?Spoke with pt at bedside, who reports good appetite. She shares that her husband had had to take responsibility for cooking, which he does not like to do. Per pt, she often eats out or her husband adds salt to her food. Pt shares that she has gained about 25 pounds, which she believes is related to fluid.  ? ?Discussed ways to decrease sodium in her diet, especially when eating out. Pt reports that she thinks she will make better food choices now that she is helping care for her neighbor with dementia, who is a close friend of hers.  ? ?Body mass index is 43.27 kg/m?Marland Kitchen Patient meets criteria for obesity, class III based on current BMI. Obesity is a complex, chronic medical condition that is optimally managed by a multidisciplinary care team. Weight loss is not an ideal goal for an acute inpatient hospitalization. However, if further work-up for obesity is warranted, consider outpatient referral to outpatient bariatric service and/or [place] Nutrition and Diabetes Education [facility].   ? ?Current diet order is heart healthy/  carb modified, patient is consuming approximately 50-75% of meals at this time. Labs and medications reviewed.  ? ?No nutrition interventions warranted at this time. If nutrition issues arise, please consult RD.  ? ?Loistine Chance, RD, LDN, CDCES ?Registered Dietitian II ?Certified Diabetes Care and Education Specialist ?Please refer to [facility] for RD and/or RD on-call/weekend/after hours pager   ?

## 2021-06-09 NOTE — Progress Notes (Signed)
Occupational Therapy Treatment ?Patient Details ?Name: [name] ?MRN: [id number] ?DOB: 22-Mar-1951 ?Today's Date: 06/09/2021 ? ? ?History of present illness [name] is a 71 y.o. adult with a past history of diabetes, atrial fibrillation, CHF who is sent to the ED from cardiology clinic due to worsening leg swelling, dyspnea on exertion, orthopnea over the past few months.  Reportedly had a 35 pound weight gain over the last few months.  She was seen in cardiology clinic, tried escalating doses of diuretics without relief.   Patient admitted for shortness of breath/acute respiratory failure with hypoxia/ CHF. She also has Afib/hx of CVA and reccurent UTI, Rt LE cellulitis. ?  ?OT comments ? [name] was seen for OT treatment on this date. Upon arrival to room pt seated EOB with mobility tech, agreeable to tx. Pt reports husband fearful of pt falling, encourages w/c use for mobility over RW, pt eager to walk in hallway with RW. Pt requires CGA + RW for sit<>stand at bed and chair height. MAX A don B socks seated EOB. MIN A don/doff gown in standing. Reports 5/10 L shoulder pain, ice applied. Pt making good progress toward goals. Pt continues to benefit from skilled OT services to maximize return to PLOF and minimize risk of future falls, injury, caregiver burden, and readmission. Will continue to follow POC. Discharge recommendation remains appropriate.  ?  ? ?Recommendations for follow up therapy are one component of a multi-disciplinary discharge planning process, led by the attending physician.  Recommendations may be updated based on patient status, additional functional criteria and insurance authorization. ?   ?Follow Up Recommendations ? Home health OT  ?  ?Assistance Recommended at Discharge Intermittent Supervision/Assistance  ?Patient can return home with the following ? A little help with walking and/or transfers;A little help with bathing/dressing/bathroom;Help with stairs or ramp for  entrance ?  ?Equipment Recommendations ? None recommended by OT  ?  ?Recommendations for Other Services   ? ?  ?Precautions / Restrictions Precautions ?Precautions: Fall ?Restrictions ?Weight Bearing Restrictions: No  ? ? ?  ? ?Mobility Bed Mobility ?  ?  ?  ?  ?  ?  ?  ?General bed mobility comments: received and left sitting ?  ? ?Transfers ?Overall transfer level: Needs assistance ?Equipment used: Rolling walker (2 wheels) ?Transfers: Sit to/from Stand ?Sit to Stand: Supervision ?  ?  ?  ?  ?  ?  ?  ?  ?Balance Overall balance assessment: Needs assistance ?Sitting-balance support: No upper extremity supported, Feet supported ?Sitting balance-Leahy Scale: Good ?  ?  ?Standing balance support: Single extremity supported, During functional activity ?Standing balance-Leahy Scale: Fair ?  ?  ?  ?  ?  ?  ?  ?  ?  ?  ?  ?  ?   ? ?ADL either performed or assessed with clinical judgement  ? ?ADL Overall ADL's : Needs assistance/impaired ?  ?  ?  ?  ?  ?  ?  ?  ?  ?  ?  ?  ?  ?  ?  ?  ?  ?  ?  ?General ADL Comments: CGA + RW for ADL t/f. MAX A odn B socks seated EOB. MIN A don/doff gown in sitting ?  ? ? ? ?Cognition Arousal/Alertness: Awake/alert ?Behavior During Therapy: [facility] for tasks assessed/performed ?Overall Cognitive Status: Within Functional Limits for tasks assessed ?  ?  ?  ?  ?  ?  ?  ?  ?  ?  ?  ?  ?  ?  ?  ?  ?  ?  ?  ?   ?   ?   ?   ? ? ?  Pertinent Vitals/ Pain       Pain Assessment ?Pain Assessment: 0-10 ?Pain Score: 5  ?Pain Location: L shoulder ?Pain Descriptors / Indicators: Discomfort, Grimacing, Guarding ?Pain Intervention(s): Limited activity within patient's tolerance, Ice applied, Patient requesting pain meds-RN notified, Repositioned ? ? ?Frequency ? Min 2X/week  ? ? ? ? ?  ?Progress Toward Goals ? ?OT Goals(current goals can now be found in the care plan section) ? Progress towards OT goals: Progressing toward goals ? ?Acute Rehab OT Goals ?Patient Stated Goal: to go home ?OT Goal Formulation: With  patient/family ?Time For Goal Achievement: 06/21/21 ?Potential to Achieve Goals: Good ?ADL Goals ?Pt Will Perform Grooming: with min assist;standing ?Pt Will Perform Lower Body Dressing: with modified independence;sit to/from stand ?Pt Will Transfer to Toilet: with modified independence;ambulating;regular height toilet  ?Plan Discharge plan remains appropriate;Frequency remains appropriate   ? ?Co-evaluation ? ? ?   ?  ?  ?  ?  ? ?  ?AM-PAC OT "6 Clicks" Daily Activity     ?Outcome Measure ? ? Help from another person eating meals?: None ?Help from another person taking care of personal grooming?: A Little ?Help from another person toileting, which includes using toliet, bedpan, or urinal?: A Little ?Help from another person bathing (including washing, rinsing, drying)?: A Little ?Help from another person to put on and taking off regular upper body clothing?: None ?Help from another person to put on and taking off regular lower body clothing?: A Lot ?6 Click Score: 19 ? ?  ?End of Session Equipment Utilized During Treatment: Rolling walker (2 wheels) ? ?OT Visit Diagnosis: Unsteadiness on feet (R26.81);Muscle weakness (generalized) (M62.81) ?  ?Activity Tolerance Patient tolerated treatment well ?  ?Patient Left in chair;with call bell/phone within reach;with family/visitor present ?  ?Nurse Communication Mobility status ?  ? ?   ? ?Time: 1535-1601 ?OT Time Calculation (min): 26 min ? ?Charges: OT General Charges ?$OT Visit: 1 Visit ?OT Treatments ?$Self Care/Home Management : 8-22 mins ?$Therapeutic Activity: 8-22 mins ? ?Dessie Coma, M.S. OTR/L  ?06/09/21, 4:30 PM  ?ascom [phone] ? ?

## 2021-06-10 ENCOUNTER — Other Ambulatory Visit: Payer: Self-pay | Admitting: Internal Medicine

## 2021-06-10 ENCOUNTER — Inpatient Hospital Stay: Payer: Medicare HMO

## 2021-06-10 ENCOUNTER — Inpatient Hospital Stay: Payer: Medicare HMO | Admitting: Oncology

## 2021-06-10 DIAGNOSIS — I152 Hypertension secondary to endocrine disorders: Secondary | ICD-10-CM

## 2021-06-10 DIAGNOSIS — R0602 Shortness of breath: Secondary | ICD-10-CM | POA: Diagnosis not present

## 2021-06-10 DIAGNOSIS — E119 Type 2 diabetes mellitus without complications: Secondary | ICD-10-CM

## 2021-06-10 LAB — GLUCOSE, CAPILLARY: Glucose-Capillary: 120 mg/dL — ABNORMAL HIGH (ref 70–99)

## 2021-06-10 MED ORDER — SULFAMETHOXAZOLE-TRIMETHOPRIM 800-160 MG PO TABS
1.0000 | ORAL_TABLET | Freq: Two times a day (BID) | ORAL | 0 refills | Status: DC
Start: 2021-06-10 — End: 2021-06-20

## 2021-06-10 NOTE — Plan of Care (Signed)
DISCHARGE NOTE HOME ?[name] to be discharged home per MD order. Discussed prescriptions and follow up appointments with the patient. Medication list explained in detail. Patient verbalized understanding. ? ?Skin clean, dry and intact without evidence of skin break down, no evidence of skin tears noted. IV catheter discontinued intact. Site without signs and symptoms of complications. Dressing and pressure applied. Pt denies pain at the site currently. No complaints noted. ? ?Patient free of lines, drains, and wounds.  ? ?An After Visit Summary (AVS) was printed and given to the patient. ?Patient to be escorted via wheelchair, and discharged home via private auto. ? ?Stephan Minister, RN  ?

## 2021-06-10 NOTE — Care Management Important Message (Signed)
Important Message ? ?Patient Details  ?Name: [name] ?MRN: [id number] ?Date of Birth: 01/09/1951 ? ? ?Medicare Important Message Given:  Yes ? ? ? ? ?[name] ?06/10/2021, 11:24 AM ?

## 2021-06-10 NOTE — Discharge Summary (Signed)
Physician Discharge Summary  ?[name] LYY:[id number] DOB: Feb 10, 1951 DOA: 06/06/2021 ? ?PCP: [name], [name], MD ? ?Admit date: 06/06/2021 ? ?Discharge date: 06/10/2021 ? ?Admitted From: Home ?Disposition: Home ? ?Recommendations for Outpatient Follow-up:  ?Follow up with PCP in 1-2 weeks. ?Please obtain BMP/CBC in one week. ?Advised to follow-up with cardiology as scheduled. ?Advised to take Bactrim twice a day for 1 more day to complete 5-day treatment for cellulitis. ? ?Home Health: PT, OT, RN ?Equipment/Devices: None ? ?Discharge Condition: Good ?CODE STATUS:Full code ?Diet recommendation: Heart Healthy  ? ?Brief Summary/ Hospital course: ?This 71 years old female with PMH significant for hypertension, paroxysmal A-fib on chronic anticoagulation, obstructive sleep apnea, diabetes mellitus, hypothyroidism, GERD, recurrent UTI, history of stroke, morbid obesity presented to the ED with multiple complaints. She has chronic recurrent UTI and recently completed antibiotic course. Patient was admitted for possible LE cellulitis and acute hypoxic respiratory failure secondary to CHF.  Patient was started on IV antibiotics and IV diuretics.  Lower extremity cellulitis is significantly improved.  Her breathing has also significantly improved with diuresis.  She has lost 10 pounds during hospitalization with diuresis.  She feels significantly improved.  Home health services been arranged.  Patient is being discharged home on Bactrim for 1 more day to complete 5-day treatment for cellulitis. ? ?She was managed for below problems ? ?Discharge Diagnoses:  ?Principal Problem: ?  SOB (shortness of breath) ?Active Problems: ?  Chronic anticoagulation (Eliquis) ?  AF (paroxysmal atrial fibrillation) ([place]) ?  Essential hypertension ?  OSA (obstructive sleep apnea) ?  DM (diabetes mellitus) type II, controlled, with peripheral vascular disorder ([place]) ?  Hypothyroidism ?  Gastro-esophageal reflux disease without  esophagitis ?  Cellulitis ?  Anemia ?  Recurrent UTI ?  Acute respiratory failure with hypoxia ([place]) ?  Stroke [facility]) ? ?Acute hypoxic respiratory failure sec. to Acute on chronic diastolic CHF: ?She presented with shortness of breath, hypoxia SPO2 87% on RA, improved to 100% on 2 L. ?Most recent echo 3/23 shows LVEF 55 to 60%.  No RWMA. ?Patient received Lasix 80 mg in the ED. ?Continued Lasix IV 40 mg daily.  Continue losartan 50 mg daily ?Monitor daily weight, intake output charting. ?She is successfully weaned down to room air. ?Her weight is down from 126Kgs - >121.6Kgs ?  ?  ? ?Intake/Output Summary (Last 24 hours) at 06/10/2021 1545 ?Last data filed at 06/10/2021 0608 ?Gross per 24 hour  ?Intake 240 ml  ?Output 995 ml  ?Net -755 ml  ?  ?  ?Atrial fibrillation: ?Heart rate well controlled. ?Metoprolol was on hold because of bradycardia. ?Continue Eliquis for anticoagulation. ?Metoprolol resumed. ?  ?Recurrent UTI: ?Patient recently completed a course of antibiotics. ?UA unremarkable , urine cultures no growth. ?  ?Bilateral lower extremity cellulitis: ?Lower extremities appears erythematous,  warm and tender. ?Continued vancomycin for now . ?Cellulitis has significantly improved. ?Discharged on Bactrim for 1 more days to complete 5-day treatment. ?  ?Type 2 diabetes: ?Resume home diabetic medications. ?  ?Hypothyroidism: ?Continue  levothyroxine ?  ?GERD: ?Continue pantoprazole ?  ?Obstructive sleep apnea: ?CPAP at night. ?  ?  ?Morbid obesity: ?Body mass index is 43.91 kg/m?. ?Diet and exercise discussed in detail. ?  ? ?Discharge Instructions ? ?Discharge Instructions   ? ? Call MD for:  difficulty breathing, headache or visual disturbances   Complete by: As directed ?  ? Call MD for:  persistant dizziness or light-headedness   Complete by: As directed ?  ?  Call MD for:  persistant nausea and vomiting   Complete by: As directed ?  ? Diet - low sodium heart healthy   Complete by: As directed ?  ? Diet Carb  Modified   Complete by: As directed ?  ? Discharge instructions   Complete by: As directed ?  ? Advised to follow-up with primary care physician in 1 week. ?Advised to follow-up with cardiology as scheduled. ?Advised to take Keflex twice a day for 3 more days.  ? Increase activity slowly   Complete by: As directed ?  ? ?  ? ?Allergies as of 06/10/2021   ? ?   Reactions  ? Morphine And Related Other (See Comments), Nausea Only  ? Patient becomes very confused ?Other reaction(s): Other (See Comments), Other (See Comments) ?Patient becomes very confused ?Patient becomes very confused  ? Penicillins Hives, Shortness Of Breath, Swelling, Other (See Comments)  ? Facial swelling ?Has patient had a PCN reaction causing immediate rash, facial/tongue/throat swelling, SOB or lightheadedness with hypotension: Yes ?Has patient had a PCN reaction causing severe rash involving mucus membranes or skin necrosis: Yes ?Has patient had a PCN reaction that required hospitalization No ?Has patient had a PCN reaction occurring within the last 10 years: No ?If all of the above answers are "NO", then may proceed with Cephalosporin use. ?Other reaction(s): Other (See Comments), Other (See Comments) ?Facial swelling ?Has patient had a PCN reaction causing immediate rash, facial/tongue/throat swelling, SOB or lightheadedness with hypotension: Yes ?Has patient had a PCN reaction causing severe rash involving mucus membranes or skin necrosis: Yes ?Has patient had a PCN reaction that required hospitalization No ?Has patient had a PCN reaction occurring within the last 10 years: No ?If all of the above answers are "NO", then may proceed with Cephalosporin use. ?Facial swelling ?Has patient had a PCN reaction causing immediate rash, facial/tongue/throat swelling, SOB or lightheadedness with hypotension: Yes ?Has patient had a PCN reaction causing severe rash involving mucus membranes or skin necrosis: Yes ?Has patient had a PCN reaction that  required hospitalization No ?Has patient had a PCN reaction occurring within the last 10 years: No ?If all of the above answers are "NO", then may proceed with Cephalosporin use. ?Facial swelling ?Has patient had a PCN reaction causing immediate rash, facial/tongue/throat swelling, SOB or lightheadedness with hypotension: Yes ?Has patient had a PCN reaction causing severe rash involving mucus membranes or skin necrosis: Yes ?Has patient had a PCN reaction that required hospitalization No ?Has patient had a PCN reaction occurring within the last 10 years: No ?If all of the above answers are "NO", then may proceed with Cephalosporin use.  ? Ambien [zolpidem]   ? Hallucination   ? Aspirin Hives  ? Oxytetracycline Hives  ? Other reaction(s): Unknown  ? ?  ? ?  ?Medication List  ?  ? ?STOP taking these medications   ? ?ciprofloxacin 500 MG tablet ?Commonly known as: Cipro ?  ?collagenase 250 UNIT/GM ointment ?Commonly known as: SANTYL ?  ?cyanocobalamin 1000 MCG tablet ?  ? ?  ? ?TAKE these medications   ? ?acetaminophen 325 MG tablet ?Commonly known as: TYLENOL ?Take 2 tablets (650 mg total) by mouth every 6 (six) hours as needed for mild pain or moderate pain (or Fever >/= 101). ?  ?albuterol (2.5 MG/3ML) 0.083% nebulizer solution ?Commonly known as: PROVENTIL ?Take 3 mLs (2.5 mg total) by nebulization every 4 (four) hours as needed for wheezing or shortness of breath. ?  ?ALPRAZolam 0.5  MG tablet ?Commonly known as: Duanne Moron ?TAKE ONE-HALF TABLET BY MOUTH TWICE DAILY AS NEEDED FOR ANXIETY ?  ?benzonatate 200 MG capsule ?Commonly known as: TESSALON ?Take 1 capsule (200 mg total) by mouth 3 (three) times daily as needed for cough. ?  ?buPROPion 150 MG 24 hr tablet ?Commonly known as: WELLBUTRIN XL ?Take 1 tablet (150 mg total) by mouth daily. ?  ?calcium-vitamin D 500-200 MG-UNIT tablet ?Commonly known as: OSCAL WITH D ?Take 1 tablet by mouth 2 (two) times daily with a meal. ?  ?clobetasol cream 0.05 % ?Commonly known as:  TEMOVATE ?Apply 1 application topically 2 (two) times daily as needed (skin irritation). ?  ?colchicine 0.6 MG tablet ?Take 1 tablet (0.6 mg total) by mouth as needed. May repeat 1 pill in 1 hr. No more than 3 total pil

## 2021-06-10 NOTE — Discharge Instructions (Signed)
Advised to follow-up with primary care physician in 1 week. ?Advised to follow-up with cardiology as scheduled. ?Advised to take Bactrim twice a day for 1 more day to complete 5-day treatment for cellulitis. ?

## 2021-06-10 NOTE — Progress Notes (Signed)
Assumed care of pt at 1900. A&O x4. RA. Wearing home CPAP overnight. C/o chronic rotator cuff pain at bedtime. Medicated per MAR. View flowsheets for full assessment.  ? ?Updated on POC w/ verbalized understanding. Call bell within reach, pt making needs known. Comfort and safety maintained.  ?

## 2021-06-11 ENCOUNTER — Other Ambulatory Visit: Payer: Self-pay

## 2021-06-11 ENCOUNTER — Telehealth: Payer: Self-pay | Admitting: Nurse Practitioner

## 2021-06-11 ENCOUNTER — Telehealth: Payer: Self-pay | Admitting: Medical

## 2021-06-11 ENCOUNTER — Encounter: Payer: Self-pay | Admitting: Internal Medicine

## 2021-06-11 ENCOUNTER — Ambulatory Visit (INDEPENDENT_AMBULATORY_CARE_PROVIDER_SITE_OTHER): Payer: Medicare HMO | Admitting: Internal Medicine

## 2021-06-11 VITALS — BP 130/65 | Ht 66.0 in | Wt 265.0 lb

## 2021-06-11 DIAGNOSIS — I872 Venous insufficiency (chronic) (peripheral): Secondary | ICD-10-CM | POA: Diagnosis not present

## 2021-06-11 DIAGNOSIS — L304 Erythema intertrigo: Secondary | ICD-10-CM | POA: Diagnosis not present

## 2021-06-11 DIAGNOSIS — R600 Localized edema: Secondary | ICD-10-CM | POA: Diagnosis not present

## 2021-06-11 DIAGNOSIS — I10 Essential (primary) hypertension: Secondary | ICD-10-CM

## 2021-06-11 DIAGNOSIS — J449 Chronic obstructive pulmonary disease, unspecified: Secondary | ICD-10-CM | POA: Diagnosis not present

## 2021-06-11 DIAGNOSIS — E119 Type 2 diabetes mellitus without complications: Secondary | ICD-10-CM | POA: Diagnosis not present

## 2021-06-11 DIAGNOSIS — E11621 Type 2 diabetes mellitus with foot ulcer: Secondary | ICD-10-CM | POA: Diagnosis not present

## 2021-06-11 DIAGNOSIS — R269 Unspecified abnormalities of gait and mobility: Secondary | ICD-10-CM

## 2021-06-11 DIAGNOSIS — M86572 Other chronic hematogenous osteomyelitis, left ankle and foot: Secondary | ICD-10-CM | POA: Diagnosis not present

## 2021-06-11 DIAGNOSIS — M797 Fibromyalgia: Secondary | ICD-10-CM

## 2021-06-11 DIAGNOSIS — L97812 Non-pressure chronic ulcer of other part of right lower leg with fat layer exposed: Secondary | ICD-10-CM | POA: Diagnosis not present

## 2021-06-11 DIAGNOSIS — I480 Paroxysmal atrial fibrillation: Secondary | ICD-10-CM

## 2021-06-11 DIAGNOSIS — I152 Hypertension secondary to endocrine disorders: Secondary | ICD-10-CM | POA: Diagnosis not present

## 2021-06-11 DIAGNOSIS — I639 Cerebral infarction, unspecified: Secondary | ICD-10-CM

## 2021-06-11 DIAGNOSIS — E1142 Type 2 diabetes mellitus with diabetic polyneuropathy: Secondary | ICD-10-CM | POA: Diagnosis not present

## 2021-06-11 DIAGNOSIS — L97522 Non-pressure chronic ulcer of other part of left foot with fat layer exposed: Secondary | ICD-10-CM | POA: Diagnosis not present

## 2021-06-11 DIAGNOSIS — E1152 Type 2 diabetes mellitus with diabetic peripheral angiopathy with gangrene: Secondary | ICD-10-CM | POA: Diagnosis not present

## 2021-06-11 DIAGNOSIS — E1159 Type 2 diabetes mellitus with other circulatory complications: Secondary | ICD-10-CM | POA: Diagnosis not present

## 2021-06-11 MED ORDER — FLUCONAZOLE 150 MG PO TABS: 150.0000 mg | ORAL_TABLET | ORAL | 0 refills | Status: DC

## 2021-06-11 MED ORDER — GOLD BOND NO MESS BODY POWDER EX AERP: 1.0000 "application " | INHALATION_SPRAY | Freq: Every day | CUTANEOUS | 11 refills | Status: DC | PRN

## 2021-06-11 MED ORDER — METFORMIN HCL 500 MG PO TABS: 500.0000 mg | ORAL_TABLET | Freq: Every day | ORAL | 3 refills | Status: DC

## 2021-06-11 MED ORDER — LOSARTAN POTASSIUM 50 MG PO TABS: 50.0000 mg | ORAL_TABLET | Freq: Every day | ORAL | 3 refills | Status: DC

## 2021-06-11 MED ORDER — NYSTATIN 100000 UNIT/GM EX CREA: 1.0000 "application " | TOPICAL_CREAM | Freq: Two times a day (BID) | CUTANEOUS | 11 refills | Status: DC

## 2021-06-11 MED ORDER — FUROSEMIDE 40 MG PO TABS: 40.0000 mg | ORAL_TABLET | Freq: Every day | ORAL | 3 refills | Status: DC

## 2021-06-11 MED ORDER — METOPROLOL SUCCINATE ER 25 MG PO TB24: 12.5000 mg | ORAL_TABLET | Freq: Every day | ORAL | 3 refills | Status: DC

## 2021-06-11 MED ORDER — PREGABALIN 50 MG PO CAPS: ORAL_CAPSULE | ORAL | 0 refills | Status: DC

## 2021-06-11 NOTE — Telephone Encounter (Signed)
Called and spoke with patient.  ? ?Verified with patient that she is to be taking both metoprolol and losartan.  ? ?Pt verbalized understanding and stated that she needed refills.  ? ?Refills sent in to patient's preferred pharmacy.  ? ?Pt voiced appreciation for call, no further questions or concerns.  ?

## 2021-06-11 NOTE — Telephone Encounter (Signed)
Patient would like to discuss medications. She would like to know if she should be taking Metoprolol and Losartan.

## 2021-06-11 NOTE — Telephone Encounter (Signed)
Per Sharolyn Douglas changed fu visit to see him.  ? ?Patient mentions she has lost 20lbs .   ?

## 2021-06-11 NOTE — Progress Notes (Signed)
Telephone  Note ? ?I connected with [name] ? on 06/11/21 at  9:40 AM EDT by a video enabled telemedicine application and verified that I am speaking with the correct person using two identifiers. ? Location patient: [place] ?Location provider:work or home office ?Persons participating in the virtual visit: patient, provider ? ?I discussed the limitations and requested verbal permission for telemedicine visit. The patient expressed understanding and agreed to proceed. ? ? ?HPI: ? ?Acute telemedicine visit for : ?Leg edema and breathing improved since hospital discharge [facility] 06/06/21 to 06/10/21 given 80 iv in the ED and 40 mg lasix thereafter iv and was on lasix 20 mg qd Feeling better can lie flat and swelling better and abdomen flatter after diuresis ?Will needs labs with upcoming cardiology 06/20/21  ? ?Intertrigo in skin folds wants Rx diflucan and wants Rx nystatin ?-Pertinent past medical history: see below ?-Pertinent medication allergies: ?Allergies  ?Allergen Reactions  ? Morphine And Related Other (See Comments) and Nausea Only  ?  Patient becomes very confused ?Other reaction(s): Other (See Comments), Other (See Comments) ?Patient becomes very confused ?Patient becomes very confused ?  ? Penicillins Hives, Shortness Of Breath, Swelling and Other (See Comments)  ?  Facial swelling ?Has patient had a PCN reaction causing immediate rash, facial/tongue/throat swelling, SOB or lightheadedness with hypotension: Yes ?Has patient had a PCN reaction causing severe rash involving mucus membranes or skin necrosis: Yes ?Has patient had a PCN reaction that required hospitalization No ?Has patient had a PCN reaction occurring within the last 10 years: No ?If all of the above answers are "NO", then may proceed with Cephalosporin use. ? ?Other reaction(s): Other (See Comments), Other (See Comments) ?Facial swelling ?Has patient had a PCN reaction causing immediate rash, facial/tongue/throat swelling, SOB or lightheadedness  with hypotension: Yes ?Has patient had a PCN reaction causing severe rash involving mucus membranes or skin necrosis: Yes ?Has patient had a PCN reaction that required hospitalization No ?Has patient had a PCN reaction occurring within the last 10 years: No ?If all of the above answers are "NO", then may proceed with Cephalosporin use. ?Facial swelling ?Has patient had a PCN reaction causing immediate rash, facial/tongue/throat swelling, SOB or lightheadedness with hypotension: Yes ?Has patient had a PCN reaction causing severe rash involving mucus membranes or skin necrosis: Yes ?Has patient had a PCN reaction that required hospitalization No ?Has patient had a PCN reaction occurring within the last 10 years: No ?If all of the above answers are "NO", then may proceed with Cephalosporin use. ?Facial swelling ?Has patient had a PCN reaction causing immediate rash, facial/tongue/throat swelling, SOB or lightheadedness with hypotension: Yes ?Has patient had a PCN reaction causing severe rash involving mucus membranes or skin necrosis: Yes ?Has patient had a PCN reaction that required hospitalization No ?Has patient had a PCN reaction occurring within the last 10 years: No ?If all of the above answers are "NO", then may proceed with Cephalosporin use. ?  ? Ambien [Zolpidem]   ?  Hallucination   ? Aspirin Hives  ? Oxytetracycline Hives  ?  Other reaction(s): Unknown ?  ? ?-COVID-19 vaccine status:  ?Immunization History  ?Administered Date(s) Administered  ? Fluad Quad(high Dose 65+) 02/07/2020, 02/06/2021  ? Influenza, High Dose Seasonal PF 11/28/2014, 02/02/2018, 01/31/2019, 02/20/2019  ? Influenza, Seasonal, Injecte, Preservative Fre 01/17/2011  ? Influenza,inj,Quad PF,6+ Mos 12/18/2013  ? Influenza-Unspecified 12/18/2013, 12/31/2015, 11/27/2016, 02/02/2018  ? PFIZER(Purple Top)SARS-COV-2 Vaccination 06/29/2019, 07/29/2019, 01/22/2020  ? Pneumococcal Conjugate-13 11/27/2016  ?  Pneumococcal Polysaccharide-23  01/17/2011, 12/17/2014  ? Tdap 03/09/2018  ? ? ? ?ROS: See pertinent positives and negatives per HPI. ? ?Past Medical History:  ?Diagnosis Date  ? Abnormal antibody titer   ? Anxiety and depression   ? Arthritis   ? Asthma   ? Atypical chest pain   ? a. 03/2018 MV: No ischemia/infarct. EF >65%.  ? Carotid arterial disease ([place])   ? a. 07/6431 U/S: RICA <29, LICA nl.  ? Chronic heart failure with preserved ejection fraction (HFpEF) ([place])   ? a. 08/2013 Echo: EF 60-65%; b. 08/2019 Echo: EF 60-65%; c. 03/2020 Echo: EF 60-65%; d. 05/2021 Echo: EF 55-60%, no rmwa, nl RV fxn, RVSP 54.69mHg. Mildly dil LA, mild MR, mod TR.  ? Cystocele   ? Depression   ? Diabetes ([place]   ? DVT (deep venous thrombosis) ([place]   ? Righ calf  ? Dyspnea   ? 11/08/2018 - "more now due to lack of exercise"  ? Edema   ? GERD (gastroesophageal reflux disease)   ? Heart murmur   ? a. 08/2013 Echo: EF 60-65%, Mild MR (poss veg seen on TTE, not seen on TEE), nl LV size with mod conc LVH. Mild LAE; b. 08/2019 Echo: EF 60-65%, no rwma. Nl RV size/fxn. No significant valvular dzs.  ? History of IBS   ? History of kidney stones   ? History of methicillin resistant staphylococcus aureus (MRSA) 2008  ? History of multiple strokes   ? Hyperlipidemia   ? Hypertension   ? Hypothyroidism   ? Insomnia   ? Kidney stone   ? kidney stones with lithotripsy .  [facility]- "adrenal glands"  ? Mitral regurgitation   ? a. 05/2021 Echo: Mild MR.  ? Neuropathy involving both lower extremities   ? Non-healing ulcer of left foot ([place]   ? Obesity, Class II, BMI 35-39.9, with comorbidity   ? PAF (paroxysmal atrial fibrillation) ([place]   ? a. 07/2013 Event monitor: Freq PVCs and 3 short runs of Afib-->CHA2DS2VASc = 7-->Eliquis.  ? PAH (pulmonary artery hypertension) ([place]   ? a. 05/2021 Echo: RVSP 54.412mg.  ? Peripheral vascular disease ([place]  ? a. 03/2018 LE Angio: Nl renal arteries. Nl aorta & iliacs. Nl LLE arterial flow; b. 06/2020 ABI's:  ? Sleep apnea   ? use C-PAP  ? [facility]  [facility]  ? 03/2020  ? Tricuspid regurgitation   ? a. 05/2021 Echo: Mod TR (in setting of RVSP 54.40m20m).  ? Urinary incontinence   ? Venous stasis dermatitis of both lower extremities   ? ? ?Past Surgical History:  ?Procedure Laterality Date  ? ANKLE SURGERY Right   ? APPLICATION OF WOUND VAC Left 06/27/2015  ? Procedure: APPLICATION OF WOUND VAC ( POSSIBLE ) ;  Surgeon: JasAlgernon HuxleyD;  Location: [facility] ORS;  Service: Vascular;  Laterality: Left;  ? APPLICATION OF WOUND VAC Left 09/25/2016  ? Procedure: APPLICATION OF WOUND VAC;  Surgeon: TroAlbertine PatriciaPM;  Location: [facility] ORS;  Service: Podiatry;  Laterality: Left;  ? arm surgery    ? right    ? CHOLECYSTECTOMY    ? COLONOSCOPY    ? COLONOSCOPY WITH PROPOFOL N/A 01/11/2017  ? Procedure: COLONOSCOPY WITH PROPOFOL;  Surgeon: SkuLollie SailsD;  [facility];  Service: Endoscopy;  Laterality: N/A;  ? COLONOSCOPY WITH PROPOFOL N/A 02/22/2017  ? Procedure: COLONOSCOPY WITH PROPOFOL;  Surgeon: SkuLollie SailsD;  [facility];  Service: Endoscopy;  Laterality: N/A;  ? COLONOSCOPY WITH PROPOFOL N/A 05/31/2017  ? Procedure: COLONOSCOPY WITH PROPOFOL;  Surgeon: [name], MD;  Location: [facility] ENDOSCOPY;  Service: Endoscopy;  Laterality: N/A;  ? ESOPHAGOGASTRODUODENOSCOPY (EGD) WITH PROPOFOL N/A 01/11/2017  ? Procedure: ESOPHAGOGASTRODUODENOSCOPY (EGD) WITH PROPOFOL;  Surgeon: [name], MD;  Location: [facility] ENDOSCOPY;  Service: Endoscopy;  Laterality: N/A;  ? ESOPHAGOGASTRODUODENOSCOPY (EGD) WITH PROPOFOL N/A 10/25/2018  ? Procedure: ESOPHAGOGASTRODUODENOSCOPY (EGD) WITH PROPOFOL;  Surgeon: [name], MD;  Location: [facility] ENDOSCOPY;  Service: Endoscopy;  Laterality: N/A;  ? ESOPHAGOGASTRODUODENOSCOPY (EGD) WITH PROPOFOL N/A 11/29/2019  ? Procedure: ESOPHAGOGASTRODUODENOSCOPY (EGD) WITH PROPOFOL;  Surgeon: Toledo, Benay Pike, MD;  Location: [facility] ENDOSCOPY;  Service: Gastroenterology;  Laterality: N/A;  ? HERNIA REPAIR    ?  umbilical  ? HIATAL HERNIA REPAIR    ? I & D EXTREMITY Left 06/27/2015  ? Procedure: IRRIGATION AND DEBRIDEMENT EXTREMITY            ( CALF HEMATOMA ) POSSIBLE WOUND VAC;  Surgeon: [name], MD;  Location: [place] OR

## 2021-06-12 ENCOUNTER — Telehealth: Payer: Self-pay

## 2021-06-12 ENCOUNTER — Encounter: Payer: Medicare HMO | Admitting: Physician Assistant

## 2021-06-12 DIAGNOSIS — L97812 Non-pressure chronic ulcer of other part of right lower leg with fat layer exposed: Secondary | ICD-10-CM | POA: Diagnosis not present

## 2021-06-12 DIAGNOSIS — E1140 Type 2 diabetes mellitus with diabetic neuropathy, unspecified: Secondary | ICD-10-CM | POA: Diagnosis not present

## 2021-06-12 DIAGNOSIS — L97822 Non-pressure chronic ulcer of other part of left lower leg with fat layer exposed: Secondary | ICD-10-CM | POA: Diagnosis not present

## 2021-06-12 DIAGNOSIS — L97522 Non-pressure chronic ulcer of other part of left foot with fat layer exposed: Secondary | ICD-10-CM | POA: Diagnosis not present

## 2021-06-12 DIAGNOSIS — Z86718 Personal history of other venous thrombosis and embolism: Secondary | ICD-10-CM | POA: Diagnosis not present

## 2021-06-12 DIAGNOSIS — E11621 Type 2 diabetes mellitus with foot ulcer: Secondary | ICD-10-CM | POA: Diagnosis not present

## 2021-06-12 DIAGNOSIS — I10 Essential (primary) hypertension: Secondary | ICD-10-CM | POA: Diagnosis not present

## 2021-06-12 DIAGNOSIS — Z7901 Long term (current) use of anticoagulants: Secondary | ICD-10-CM | POA: Diagnosis not present

## 2021-06-12 DIAGNOSIS — E11622 Type 2 diabetes mellitus with other skin ulcer: Secondary | ICD-10-CM | POA: Diagnosis not present

## 2021-06-12 DIAGNOSIS — I872 Venous insufficiency (chronic) (peripheral): Secondary | ICD-10-CM | POA: Diagnosis not present

## 2021-06-12 NOTE — Progress Notes (Signed)
SAVANA, SPINA ([id number]) ?Visit Report for 06/12/2021 ?Arrival Information Details ?Patient Name: [name], [name] ?Date of Service: 06/12/2021 2:15 PM ?Medical Record Number: [id number] ?Patient Account Number: [id number] ?Date of Birth/Sex: [birth year] (71 y.o. F) ?Treating RN: Donnamarie Poag ?Primary Care Americus Scheurich: [name], Olivia Mackie Other Clinician: ?Referring Hesham Womac: [name], Olivia Mackie ?Treating Lauralie Blacksher/Extender: Jeri Cos ?Weeks in Treatment: 49 ?Visit Information History Since Last Visit ?Added or deleted any medications: Yes ?Patient Arrived: Wheel Chair ?Had a fall or experienced change in No ?Arrival Time: 14:16 ?activities of daily living that may affect ?Accompanied By: husband ?risk of falls: ?Transfer Assistance: EasyPivot Patient Lift ?Hospitalized since last visit: Yes ?Patient Identification Verified: Yes ?Has Dressing in Place as Prescribed: Yes ?Secondary Verification Process Completed: Yes ?Pain Present Now: No ?Patient Requires Transmission-Based No ?Precautions: ?Patient Has Alerts: Yes ?Patient Alerts: Patient on Blood Thinner ?Eliquis ?Diabetic II ?06/2020 ABI L)1.22 R) ?1.14 ?Electronic Signature(s) ?Signed: 06/12/2021 4:16:55 PM By: Donnamarie Poag ?Entered By: Donnamarie Poag on 06/12/2021 14:16:44 ?[name], [name] ([id number]) ?-------------------------------------------------------------------------------- ?Clinic Level of Care Assessment Details ?Patient Name: [name], [name] ?Date of Service: 06/12/2021 2:15 PM ?Medical Record Number: [id number] ?Patient Account Number: [id number] ?Date of Birth/Sex: 09-07-1950 (71 y.o. F) ?Treating RN: Levora Dredge ?Primary Care Spence Soberano: [name], Olivia Mackie Other Clinician: ?Referring Annalea Alguire: [name], Olivia Mackie ?Treating Frannie Shedrick/Extender: Jeri Cos ?Weeks in Treatment: 49 ?Clinic Level of Care Assessment Items ?TOOL 4 Quantity Score ?'[]'$  - Use when only an EandM is performed on FOLLOW-UP visit 0 ?ASSESSMENTS - Nursing Assessment /  Reassessment ?'[]'$  - Reassessment of Co-morbidities (includes updates in patient status) 0 ?X- 1 5 ?Reassessment of Adherence to Treatment Plan ?ASSESSMENTS - Wound and Skin Assessment / Reassessment ?X - Simple Wound Assessment / Reassessment - one wound 1 5 ?'[]'$  - 0 ?Complex Wound Assessment / Reassessment - multiple wounds ?'[]'$  - 0 ?Dermatologic / Skin Assessment (not related to wound area) ?ASSESSMENTS - Focused Assessment ?'[]'$  - Circumferential Edema Measurements - multi extremities 0 ?'[]'$  - 0 ?Nutritional Assessment / Counseling / Intervention ?'[]'$  - 0 ?Lower Extremity Assessment (monofilament, tuning fork, pulses) ?'[]'$  - 0 ?Peripheral Arterial Disease Assessment (using hand held doppler) ?ASSESSMENTS - Ostomy and/or Continence Assessment and Care ?'[]'$  - Incontinence Assessment and Management 0 ?'[]'$  - 0 ?Ostomy Care Assessment and Management (repouching, etc.) ?PROCESS - Coordination of Care ?X - Simple Patient / Family Education for ongoing care 1 15 ?'[]'$  - 0 ?Complex (extensive) Patient / Family Education for ongoing care ?'[]'$  - 0 ?Staff obtains Consents, Records, Test Results / Process Orders ?'[]'$  - 0 ?Staff telephones HHA, Nursing Homes / Clarify orders / etc ?'[]'$  - 0 ?Routine Transfer to another Facility (non-emergent condition) ?'[]'$  - 0 ?Routine Hospital Admission (non-emergent condition) ?'[]'$  - 0 ?New Admissions / Biomedical engineer / Ordering NPWT, Apligraf, etc. ?'[]'$  - 0 ?Emergency Hospital Admission (emergent condition) ?X- 1 10 ?Simple Discharge Coordination ?'[]'$  - 0 ?Complex (extensive) Discharge Coordination ?PROCESS - Special Needs ?'[]'$  - Pediatric / Minor Patient Management 0 ?'[]'$  - 0 ?Isolation Patient Management ?'[]'$  - 0 ?Hearing / Language / Visual special needs ?'[]'$  - 0 ?Assessment of Community assistance (transportation, D/C planning, etc.) ?'[]'$  - 0 ?Additional assistance / Altered mentation ?'[]'$  - 0 ?Support Surface(s) Assessment (bed, cushion, seat, etc.) ?INTERVENTIONS - Wound Cleansing /  Measurement ?[name], [name] ([id number]) ?X- 1 5 ?Simple Wound Cleansing - one wound ?'[]'$  - 0 ?Complex Wound Cleansing - multiple wounds ?'[]'$  - 0 ?Wound Imaging (photographs - any number of wounds) ?'[]'$  - 0 ?Wound Tracing (instead of  photographs) ?X- 1 5 ?Simple Wound Measurement - one wound ?'[]'$  - 0 ?Complex Wound Measurement - multiple wounds ?INTERVENTIONS - Wound Dressings ?X - Small Wound Dressing one or multiple wounds 1 10 ?'[]'$  - 0 ?Medium Wound Dressing one or multiple wounds ?'[]'$  - 0 ?Large Wound Dressing one or multiple wounds ?'[]'$  - 0 ?Application of Medications - topical ?'[]'$  - 0 ?Application of Medications - injection ?INTERVENTIONS - Miscellaneous ?'[]'$  - External ear exam 0 ?'[]'$  - 0 ?Specimen Collection (cultures, biopsies, blood, body fluids, etc.) ?'[]'$  - 0 ?Specimen(s) / Culture(s) sent or taken to Lab for analysis ?'[]'$  - 0 ?Patient Transfer (multiple staff / Civil Service fast streamer / Similar devices) ?'[]'$  - 0 ?Simple Staple / Suture removal (25 or less) ?'[]'$  - 0 ?Complex Staple / Suture removal (26 or more) ?'[]'$  - 0 ?Hypo / Hyperglycemic Management (close monitor of Blood Glucose) ?'[]'$  - 0 ?Ankle / Brachial Index (ABI) - do not check if billed separately ?X- 1 5 ?Vital Signs ?Has the patient been seen at the hospital within the last three years: Yes ?Total Score: 60 ?Level Of Care: New/Established - Level ?2 ?Electronic Signature(s) ?Signed: 06/12/2021 4:34:33 PM By: Levora Dredge ?Entered By: Levora Dredge on 06/12/2021 15:05:46 ?[name], Dawn B. ([id number]) ?-------------------------------------------------------------------------------- ?Encounter Discharge Information Details ?Patient Name: [name], [name] ?Date of Service: 06/12/2021 2:15 PM ?Medical Record Number: [id number] ?Patient Account Number: [id number] ?Date of Birth/Sex: 01-22-1951 (72 y.o. F) ?Treating RN: Levora Dredge ?Primary Care Brondon Wann: [name], Olivia Mackie Other Clinician: ?Referring Kathye Cipriani: [name], Olivia Mackie ?Treating Tyneshia Stivers/Extender:  Jeri Cos ?Weeks in Treatment: 49 ?Encounter Discharge Information Items ?Discharge Condition: Stable ?Ambulatory Status: Wheelchair ?Discharge Destination: Home ?Transportation: Private Auto ?Accompanied By: husband ?Schedule Follow-up Appointment: Yes ?Clinical Summary of Care: ?Electronic Signature(s) ?Signed: 06/12/2021 4:34:33 PM By: Levora Dredge ?Entered By: Levora Dredge on 06/12/2021 15:07:46 ?[name], Dawn B. ([id number]) ?-------------------------------------------------------------------------------- ?Lower Extremity Assessment Details ?Patient Name: [name], [name] ?Date of Service: 06/12/2021 2:15 PM ?Medical Record Number: [id number] ?Patient Account Number: [id number] ?Date of Birth/Sex: 1950-10-25 (71 y.o. F) ?Treating RN: Donnamarie Poag ?Primary Care Ammar Moffatt: [name], Olivia Mackie Other Clinician: ?Referring Dayana Dalporto: [name], Olivia Mackie ?Treating Lauree Yurick/Extender: Jeri Cos ?Weeks in Treatment: 49 ?Edema Assessment ?Assessed: [Left: No] [Right: Yes] ?[Left: Edema] [Right: :] ?Calf ?Left: Right: ?Point of Measurement: 32 cm From Medial Instep 36 cm ?Ankle ?Left: Right: ?Point of Measurement: 9 cm From Medial Instep 19.5 cm ?Vascular Assessment ?Pulses: ?Dorsalis Pedis ?Palpable: [Right:Yes] ?Electronic Signature(s) ?Signed: 06/12/2021 4:16:55 PM By: Donnamarie Poag ?Entered By: Donnamarie Poag on 06/12/2021 14:24:00 ?[name], [name] ([id number]) ?-------------------------------------------------------------------------------- ?Multi Wound Chart Details ?Patient Name: [name], [name] ?Date of Service: 06/12/2021 2:15 PM ?Medical Record Number: [id number] ?Patient Account Number: [id number] ?Date of Birth/Sex: 11-05-1950 (71 y.o. F) ?Treating RN: Donnamarie Poag ?Primary Care Braylon Grenda: [name], Olivia Mackie Other Clinician: ?Referring Kyley Laurel: [name], Olivia Mackie ?Treating Davier Tramell/Extender: Jeri Cos ?Weeks in Treatment: 49 ?Vital Signs ?Height(in): 65.5 ?Pulse(bpm): 80 ?Weight(lbs): 240 ?Blood  Pressure(mmHg): 103/62 ?Body Mass Index(BMI): 39.3 ?Temperature(??F): 98.6 ?Respiratory Rate(breaths/min): 16 ?Photos: [N/A:N/A] ?Wound Location: Right, Anterior Lower Leg N/A N/A ?Wounding Event: Blister N/A N/A ?Primary Etio

## 2021-06-12 NOTE — Telephone Encounter (Signed)
Transition Care Management Unsuccessful Follow-up Telephone Call ? ?Date of discharge and from where:  06/10/21 [facility] ? ?Attempts:  1st Attempt ? ?Reason for unsuccessful TCM follow-up call:  Left voice message ? ?  ?

## 2021-06-12 NOTE — Progress Notes (Addendum)
MY, RINKE ([id number]) ?Visit Report for 06/12/2021 ?Chief Complaint Document Details ?Patient Name: [name], [name] ?Date of Service: 06/12/2021 2:15 PM ?Medical Record Number: [id number] ?Patient Account Number: [id number] ?Date of Birth/Sex: 1951-03-13 (71 y.o. F) ?Treating RN: Levora Dredge ?Primary Care Provider: [name], Olivia Mackie Other Clinician: ?Referring Provider: [name], Olivia Mackie ?Treating Provider/Extender: Jeri Cos ?Weeks in Treatment: 49 ?Information Obtained from: Patient ?Chief Complaint ?Bilateral Leg Ulcers and Right 4thToe Ulcer ?Electronic Signature(s) ?Signed: 06/12/2021 2:27:38 PM By: Worthy Keeler PA-C ?Entered By: Worthy Keeler on 06/12/2021 14:27:38 ?[name] ([id number]) ?-------------------------------------------------------------------------------- ?HPI Details ?Patient Name: [name], [name] ?Date of Service: 06/12/2021 2:15 PM ?Medical Record Number: [id number] ?Patient Account Number: [id number] ?Date of Birth/Sex: March 31, 1950 (71 y.o. F) ?Treating RN: Levora Dredge ?Primary Care Provider: [name], Olivia Mackie Other Clinician: ?Referring Provider: [name], Olivia Mackie ?Treating Provider/Extender: Jeri Cos ?Weeks in Treatment: 49 ?History of Present Illness ?HPI Description: 06/28/20 upon evaluation today patient appears to be doing unfortunately somewhat poorly in regard to wounds that she has ?over her lower extremities. She has a wound on the left distal second toe, left posterior heel, and right anterior lower leg. With that being said all ?in all none of the wounds appear to be too bad the one that hurts her the most is actually the wound on the posterior heel which actually is also ?one of the smallest. Nonetheless I think there is some callus hiding what is really going on in this area. Fortunately there does not appear to be ?any obvious evidence of infection which is great news. Patient does have a history of diabetes mellitus type 2, chronic  venous insufficiency, ?hypertension, history of DVTs, and is on long-term anticoagulant therapy, Eliquis. ?07/05/20 upon evaluation today patient appears to be doing well currently with regard to her wounds. I feel like she is making progress which is ?great news and overall there is no signs of active infection at this time. No fevers, chills, nausea, vomiting, or diarrhea. ?4/29; patient has a only a small area remaining on the tip of her left second toe and a small area remaining on the left heel. The area on the right ?anterior lower leg is healed. They tell me that she had remote podiatric surgery on the toes of the left foot however they are very fixed in position ?and I wonder whether they are making it difficult to relieve pressure in her foot wear. She is a type II diabetic with neuropathy as well. ?08/01/2020 upon evaluation today patient appears to be doing well currently in regard to her wounds. In fact everything appears to be doing much ?better. The posterior aspect of her heel is actually giving her some trouble not the Achilles area but a small wound that we did not even have ?marked. In fact I had noted this before. Nonetheless we are going to addressed that today. ?5/27; most of the patient's wounds are healed including the left Achilles heel left toe. Right forearm is also healed. She has a new abrasion ?posteriorly in the right elbow area just above the olecranon this happened today with an abrasion from her car door ?6/15; the patient's wounds on the left Achilles and left second toe remain healed. The area on the right forearm is also just about healed in fact ?the skin I replaced last time is looking viable which is good. She had me look at the left heel again which is not in the area of the wound more ?towards the tip of the heel at  the Achilles insertion site as well as the tip of the left foot second toe. In the Achilles area that is clearly is friction ?probably in bed at night. Not really  near where the wound was. The left second toe remains healed although this is hammered and overhanging ?first toe also puts pressure in this area ?09/13/2020 upon evaluation today patient appears to be doing well with regard to all previous wounds that she had. Everything is completely ?healed. With that being said I do think that the patient unfortunately has a new skin tear on the right forearm which is due to her put a Band-Aid ?on it and then when it pulled off this caused some issues with skin tearing. Nonetheless I think that this needs to be addressed today. Hopefully ?will take too long to get this to heal. She is having some issues with pain in her heel but I think this is more related to be honest to her ?neuropathy as opposed to anything else. ?Readmission: ?10/30/2020 upon evaluation today patient appears to be doing poorly in regard to her legs bilaterally. She is also having an issue with her fourth ?toe on the left. Fortunately there does not appear to be any signs of active infection at this time. No fevers, chills, nausea, vomiting, or diarrhea. ?It is actually been quite a while since we have seen her. In fact June 24 was the last time that she was here in the clinic. She tells me she did not ?come back because things were just doing "pretty well". ?11/29/2020 upon evaluation today patient actually appears to be doing well with regard to her arms those are completely healed. Her right leg is ?still open but doing okay. The fourth toe of the left foot does show some signs here of having some issues with necrotic tissue of an open wound. ?This is where a toenail was removed by podiatry. Subsequently I do think that we need to go ahead and see what we can do about getting this ?cleared away so being try to get some healing going forward. ?12/13/2020 upon evaluation today patient appears to be doing decently well in regard to her wound on the right leg. In general I think that she is ?making good progress  here. Unfortunately she continues to have significant issues with swelling in the left leg is now even more swollen at this ?point. For that reason I do believe she would be benefited by wrapping both sides although she is not interested in doing this. Overall I think that ?she is definitely making some good progress here nonetheless in regard to the right leg left leg leave some to be desired. ?12/27/2020 upon evaluation today patient appears to be doing well with regard to her wound in regard to the legs. Unfortunately the toe was not ?doing nearly as well. I am much more concerned about infection and osteomyelitis here. She had the MRI but right now were not seeing obvious ?evidence of redness spreading up to her foot this is good news I do not have the result of the MRI as of yet. I am good to place her on doxycycline ?however due to the fact that the wound does seem to be getting a little worse in regard to her toe. ?01/14/2021 upon evaluation today patient appears to be doing well with regard to her ulcers. The right leg is doing well and even the toe seems ?to be doing well though she still has some evidence of bone exposed on  the toe which does not appear to be doing quite as good as I would like to ?see just and the fact that is still not covered over new tissue but looks tremendously better compared to 2 weeks ago. Overall very pleased in that ?regard. ?01/28/2021 upon evaluation today patient's leg on the left actually appears to have new wounds this is due to her deciding to shave yesterday. ?Fortunately there does not appear to be any signs of infection currently. This is good news. Nonetheless I do believe that with these new areas ?open it would be best to wrap her leg to try to make sure we get this under control sooner rather than later. With regard to her toe there still does ?appear to be some evidence of bone exposed this was covered over with callus I did remove that today. Nonetheless I think  this is significantly ?smaller than previous. ?02/11/2021 upon evaluation today patient appears to be doing okay in regard to her leg ulcers. I do not see anything obvious at this point this ?seems to be a

## 2021-06-13 NOTE — Telephone Encounter (Signed)
Transition Care Management Unsuccessful Follow-up Telephone Call ? ?Date of discharge and from where:  06/10/21 [facility] ? ?Attempts:  2nd Attempt ? ?Reason for unsuccessful TCM follow-up call:  Unable to reach patient.  ? ?  ?

## 2021-06-13 NOTE — Telephone Encounter (Signed)
Transition Care Management Unsuccessful Follow-up Telephone Call ? ?Date of discharge and from where:  06/10/21 [facility] ? ?Attempts:  3rd Attempt ? ?Reason for unsuccessful TCM follow-up call:  Left voice message. Last attempt for transitional care follow up.  ? ?  ?

## 2021-06-16 ENCOUNTER — Telehealth: Payer: Self-pay | Admitting: Cardiovascular Disease

## 2021-06-16 NOTE — Telephone Encounter (Signed)
Fwd to Ignacia Bayley, NP to advise ?

## 2021-06-16 NOTE — Telephone Encounter (Signed)
Ok to resume.

## 2021-06-16 NOTE — Telephone Encounter (Signed)
Patient wants to clarify that it is still ok to use her lymphedema machine ?Patient was in the hospital recently  ?Please call to discuss  ?

## 2021-06-17 DIAGNOSIS — L97522 Non-pressure chronic ulcer of other part of left foot with fat layer exposed: Secondary | ICD-10-CM | POA: Diagnosis not present

## 2021-06-17 DIAGNOSIS — E1142 Type 2 diabetes mellitus with diabetic polyneuropathy: Secondary | ICD-10-CM | POA: Diagnosis not present

## 2021-06-17 DIAGNOSIS — I152 Hypertension secondary to endocrine disorders: Secondary | ICD-10-CM | POA: Diagnosis not present

## 2021-06-17 DIAGNOSIS — J449 Chronic obstructive pulmonary disease, unspecified: Secondary | ICD-10-CM | POA: Diagnosis not present

## 2021-06-17 DIAGNOSIS — E11621 Type 2 diabetes mellitus with foot ulcer: Secondary | ICD-10-CM | POA: Diagnosis not present

## 2021-06-17 DIAGNOSIS — I872 Venous insufficiency (chronic) (peripheral): Secondary | ICD-10-CM | POA: Diagnosis not present

## 2021-06-17 DIAGNOSIS — E1159 Type 2 diabetes mellitus with other circulatory complications: Secondary | ICD-10-CM | POA: Diagnosis not present

## 2021-06-17 DIAGNOSIS — M86572 Other chronic hematogenous osteomyelitis, left ankle and foot: Secondary | ICD-10-CM | POA: Diagnosis not present

## 2021-06-17 DIAGNOSIS — L97812 Non-pressure chronic ulcer of other part of right lower leg with fat layer exposed: Secondary | ICD-10-CM | POA: Diagnosis not present

## 2021-06-17 DIAGNOSIS — E1152 Type 2 diabetes mellitus with diabetic peripheral angiopathy with gangrene: Secondary | ICD-10-CM | POA: Diagnosis not present

## 2021-06-17 NOTE — Telephone Encounter (Signed)
Patient made aware of Ignacia Bayley, NP response with verbalized understanding. ?

## 2021-06-18 ENCOUNTER — Telehealth: Payer: Self-pay | Admitting: Internal Medicine

## 2021-06-18 NOTE — Telephone Encounter (Signed)
[name] from [facility] called wanting to complete the home health occupational therapy this week  ?

## 2021-06-19 ENCOUNTER — Encounter: Payer: Medicare HMO | Admitting: Physician Assistant

## 2021-06-19 ENCOUNTER — Telehealth: Payer: Self-pay | Admitting: Pulmonary Disease

## 2021-06-19 DIAGNOSIS — L97522 Non-pressure chronic ulcer of other part of left foot with fat layer exposed: Secondary | ICD-10-CM | POA: Diagnosis not present

## 2021-06-19 DIAGNOSIS — E11621 Type 2 diabetes mellitus with foot ulcer: Secondary | ICD-10-CM | POA: Diagnosis not present

## 2021-06-19 DIAGNOSIS — I872 Venous insufficiency (chronic) (peripheral): Secondary | ICD-10-CM | POA: Diagnosis not present

## 2021-06-19 DIAGNOSIS — Z7901 Long term (current) use of anticoagulants: Secondary | ICD-10-CM | POA: Diagnosis not present

## 2021-06-19 DIAGNOSIS — L97512 Non-pressure chronic ulcer of other part of right foot with fat layer exposed: Secondary | ICD-10-CM | POA: Diagnosis not present

## 2021-06-19 DIAGNOSIS — L97822 Non-pressure chronic ulcer of other part of left lower leg with fat layer exposed: Secondary | ICD-10-CM | POA: Diagnosis not present

## 2021-06-19 DIAGNOSIS — L97812 Non-pressure chronic ulcer of other part of right lower leg with fat layer exposed: Secondary | ICD-10-CM | POA: Diagnosis not present

## 2021-06-19 DIAGNOSIS — I10 Essential (primary) hypertension: Secondary | ICD-10-CM | POA: Diagnosis not present

## 2021-06-19 DIAGNOSIS — Z86718 Personal history of other venous thrombosis and embolism: Secondary | ICD-10-CM | POA: Diagnosis not present

## 2021-06-19 DIAGNOSIS — E1140 Type 2 diabetes mellitus with diabetic neuropathy, unspecified: Secondary | ICD-10-CM | POA: Diagnosis not present

## 2021-06-19 MED ORDER — TRELEGY ELLIPTA 200-62.5-25 MCG/ACT IN AEPB: 200.0000 ug | INHALATION_SPRAY | Freq: Every day | RESPIRATORY_TRACT | 3 refills | Status: DC

## 2021-06-19 NOTE — Progress Notes (Addendum)
ANYJAH, ROUNDTREE ([id number]) ?Visit Report for 06/19/2021 ?Chief Complaint Document Details ?Patient Name: [name], [name] ?Date of Service: 06/19/2021 1:15 PM ?Medical Record Number: [id number] ?Patient Account Number: [id number] ?Date of Birth/Sex: 19-Jul-1950 (71 y.o. F) ?Treating RN: Carlene Coria ?Primary Care Provider: [name], Olivia Mackie Other Clinician: ?Referring Provider: [name], Olivia Mackie ?Treating Provider/Extender: Jeri Cos ?Weeks in Treatment: 50 ?Information Obtained from: Patient ?Chief Complaint ?Bilateral Leg Ulcers and Right 4thToe Ulcer ?Electronic Signature(s) ?Signed: 06/19/2021 1:33:04 PM By: Worthy Keeler PA-C ?Entered By: Worthy Keeler on 06/19/2021 13:33:04 ?[name], Dawn B. ([id number]) ?-------------------------------------------------------------------------------- ?HPI Details ?Patient Name: [name], [name] ?Date of Service: 06/19/2021 1:15 PM ?Medical Record Number: [id number] ?Patient Account Number: [id number] ?Date of Birth/Sex: [birth year] (71 y.o. F) ?Treating RN: Carlene Coria ?Primary Care Provider: [name], Olivia Mackie Other Clinician: ?Referring Provider: [name], Olivia Mackie ?Treating Provider/Extender: Jeri Cos ?Weeks in Treatment: 50 ?History of Present Illness ?HPI Description: 06/28/20 upon evaluation today patient appears to be doing unfortunately somewhat poorly in regard to wounds that she has ?over her lower extremities. She has a wound on the left distal second toe, left posterior heel, and right anterior lower leg. With that being said all ?in all none of the wounds appear to be too bad the one that hurts her the most is actually the wound on the posterior heel which actually is also ?one of the smallest. Nonetheless I think there is some callus hiding what is really going on in this area. Fortunately there does not appear to be ?any obvious evidence of infection which is great news. Patient does have a history of diabetes mellitus type 2, chronic venous  insufficiency, ?hypertension, history of DVTs, and is on long-term anticoagulant therapy, Eliquis. ?07/05/20 upon evaluation today patient appears to be doing well currently with regard to her wounds. I feel like she is making progress which is ?great news and overall there is no signs of active infection at this time. No fevers, chills, nausea, vomiting, or diarrhea. ?4/29; patient has a only a small area remaining on the tip of her left second toe and a small area remaining on the left heel. The area on the right ?anterior lower leg is healed. They tell me that she had remote podiatric surgery on the toes of the left foot however they are very fixed in position ?and I wonder whether they are making it difficult to relieve pressure in her foot wear. She is a type II diabetic with neuropathy as well. ?08/01/2020 upon evaluation today patient appears to be doing well currently in regard to her wounds. In fact everything appears to be doing much ?better. The posterior aspect of her heel is actually giving her some trouble not the Achilles area but a small wound that we did not even have ?marked. In fact I had noted this before. Nonetheless we are going to addressed that today. ?5/27; most of the patient's wounds are healed including the left Achilles heel left toe. Right forearm is also healed. She has a new abrasion ?posteriorly in the right elbow area just above the olecranon this happened today with an abrasion from her car door ?6/15; the patient's wounds on the left Achilles and left second toe remain healed. The area on the right forearm is also just about healed in fact ?the skin I replaced last time is looking viable which is good. She had me look at the left heel again which is not in the area of the wound more ?towards the tip of the heel at  the Achilles insertion site as well as the tip of the left foot second toe. In the Achilles area that is clearly is friction ?probably in bed at night. Not really near  where the wound was. The left second toe remains healed although this is hammered and overhanging ?first toe also puts pressure in this area ?09/13/2020 upon evaluation today patient appears to be doing well with regard to all previous wounds that she had. Everything is completely ?healed. With that being said I do think that the patient unfortunately has a new skin tear on the right forearm which is due to her put a Band-Aid ?on it and then when it pulled off this caused some issues with skin tearing. Nonetheless I think that this needs to be addressed today. Hopefully ?will take too long to get this to heal. She is having some issues with pain in her heel but I think this is more related to be honest to her ?neuropathy as opposed to anything else. ?Readmission: ?10/30/2020 upon evaluation today patient appears to be doing poorly in regard to her legs bilaterally. She is also having an issue with her fourth ?toe on the left. Fortunately there does not appear to be any signs of active infection at this time. No fevers, chills, nausea, vomiting, or diarrhea. ?It is actually been quite a while since we have seen her. In fact June 24 was the last time that she was here in the clinic. She tells me she did not ?come back because things were just doing "pretty well". ?11/29/2020 upon evaluation today patient actually appears to be doing well with regard to her arms those are completely healed. Her right leg is ?still open but doing okay. The fourth toe of the left foot does show some signs here of having some issues with necrotic tissue of an open wound. ?This is where a toenail was removed by podiatry. Subsequently I do think that we need to go ahead and see what we can do about getting this ?cleared away so being try to get some healing going forward. ?12/13/2020 upon evaluation today patient appears to be doing decently well in regard to her wound on the right leg. In general I think that she is ?making good progress here.  Unfortunately she continues to have significant issues with swelling in the left leg is now even more swollen at this ?point. For that reason I do believe she would be benefited by wrapping both sides although she is not interested in doing this. Overall I think that ?she is definitely making some good progress here nonetheless in regard to the right leg left leg leave some to be desired. ?12/27/2020 upon evaluation today patient appears to be doing well with regard to her wound in regard to the legs. Unfortunately the toe was not ?doing nearly as well. I am much more concerned about infection and osteomyelitis here. She had the MRI but right now were not seeing obvious ?evidence of redness spreading up to her foot this is good news I do not have the result of the MRI as of yet. I am good to place her on doxycycline ?however due to the fact that the wound does seem to be getting a little worse in regard to her toe. ?01/14/2021 upon evaluation today patient appears to be doing well with regard to her ulcers. The right leg is doing well and even the toe seems ?to be doing well though she still has some evidence of bone exposed on  the toe which does not appear to be doing quite as good as I would like to ?see just and the fact that is still not covered over new tissue but looks tremendously better compared to 2 weeks ago. Overall very pleased in that ?regard. ?01/28/2021 upon evaluation today patient's leg on the left actually appears to have new wounds this is due to her deciding to shave yesterday. ?Fortunately there does not appear to be any signs of infection currently. This is good news. Nonetheless I do believe that with these new areas ?open it would be best to wrap her leg to try to make sure we get this under control sooner rather than later. With regard to her toe there still does ?appear to be some evidence of bone exposed this was covered over with callus I did remove that today. Nonetheless I think this is  significantly ?smaller than previous. ?02/11/2021 upon evaluation today patient appears to be doing okay in regard to her leg ulcers. I do not see anything obvious at this point this ?seems to be a compli

## 2021-06-19 NOTE — Telephone Encounter (Signed)
Medication sent in nothing further needed. ?

## 2021-06-20 ENCOUNTER — Ambulatory Visit (INDEPENDENT_AMBULATORY_CARE_PROVIDER_SITE_OTHER): Payer: Medicare HMO | Admitting: Nurse Practitioner

## 2021-06-20 ENCOUNTER — Ambulatory Visit: Payer: Medicare HMO | Admitting: Medical

## 2021-06-20 ENCOUNTER — Encounter: Payer: Self-pay | Admitting: Nurse Practitioner

## 2021-06-20 VITALS — BP 124/60 | HR 61 | Ht 66.0 in | Wt 258.0 lb

## 2021-06-20 DIAGNOSIS — I5033 Acute on chronic diastolic (congestive) heart failure: Secondary | ICD-10-CM | POA: Diagnosis not present

## 2021-06-20 DIAGNOSIS — I480 Paroxysmal atrial fibrillation: Secondary | ICD-10-CM

## 2021-06-20 DIAGNOSIS — I10 Essential (primary) hypertension: Secondary | ICD-10-CM | POA: Diagnosis not present

## 2021-06-20 DIAGNOSIS — N183 Chronic kidney disease, stage 3 unspecified: Secondary | ICD-10-CM

## 2021-06-20 DIAGNOSIS — I5032 Chronic diastolic (congestive) heart failure: Secondary | ICD-10-CM | POA: Diagnosis not present

## 2021-06-20 DIAGNOSIS — E785 Hyperlipidemia, unspecified: Secondary | ICD-10-CM

## 2021-06-20 NOTE — Telephone Encounter (Signed)
All orders that came in have been given to physician to sign and will be sending out faxes on 06/20/21 and 06/23/21.  ?

## 2021-06-20 NOTE — Progress Notes (Signed)
LUVINA, POIRIER ([id number]) ?Visit Report for 06/19/2021 ?Arrival Information Details ?Patient Name: [name], [name] ?Date of Service: 06/19/2021 1:15 PM ?Medical Record Number: [id number] ?Patient Account Number: [id number] ?Date of Birth/Sex: 04-Oct-1950 (71 y.o. F) ?Treating RN: Carlene Coria ?Primary Care Terrie Grajales: [name], Olivia Mackie Other Clinician: ?Referring Kaleb Linquist: [name], Olivia Mackie ?Treating Aradhya Shellenbarger/Extender: Jeri Cos ?Weeks in Treatment: 50 ?Visit Information History Since Last Visit ?All ordered tests and consults were completed: No ?Patient Arrived: Wheel Chair ?Added or deleted any medications: No ?Arrival Time: 13:18 ?Any new allergies or adverse reactions: No ?Accompanied By: husband ?Had a fall or experienced change in No ?Transfer Assistance: None ?activities of daily living that may affect ?Patient Identification Verified: Yes ?risk of falls: ?Secondary Verification Process Completed: Yes ?Signs or symptoms of abuse/neglect since last visito No ?Patient Requires Transmission-Based No ?Hospitalized since last visit: No ?Precautions: ?Implantable device outside of the clinic excluding No ?Patient Has Alerts: Yes ?cellular tissue based products placed in the center ?Patient Alerts: Patient on Blood Thinner ?since last visit: ?Eliquis ?Has Dressing in Place as Prescribed: Yes ?Diabetic II ?Pain Present Now: No ?06/2020 ABI L)1.22 R) ?1.14 ?Electronic Signature(s) ?Signed: 06/20/2021 4:01:15 PM By: Carlene Coria RN ?Entered By: Carlene Coria on 06/19/2021 13:26:43 ?[name], Dawn B. ([id number]) ?-------------------------------------------------------------------------------- ?Clinic Level of Care Assessment Details ?Patient Name: [name], [name] ?Date of Service: 06/19/2021 1:15 PM ?Medical Record Number: [id number] ?Patient Account Number: [id number] ?Date of Birth/Sex: 10-Aug-1950 (71 y.o. F) ?Treating RN: Carlene Coria ?Primary Care Thessaly Mccullers: [name], Olivia Mackie Other Clinician: ?Referring  Arrington Bencomo: [name], Olivia Mackie ?Treating Banessa Mao/Extender: Jeri Cos ?Weeks in Treatment: 50 ?Clinic Level of Care Assessment Items ?TOOL 4 Quantity Score ?X - Use when only an EandM is performed on FOLLOW-UP visit 1 0 ?ASSESSMENTS - Nursing Assessment / Reassessment ?X - Reassessment of Co-morbidities (includes updates in patient status) 1 10 ?X- 1 5 ?Reassessment of Adherence to Treatment Plan ?ASSESSMENTS - Wound and Skin Assessment / Reassessment ?X - Simple Wound Assessment / Reassessment - one wound 1 5 ?'[]'$  - 0 ?Complex Wound Assessment / Reassessment - multiple wounds ?'[]'$  - 0 ?Dermatologic / Skin Assessment (not related to wound area) ?ASSESSMENTS - Focused Assessment ?'[]'$  - Circumferential Edema Measurements - multi extremities 0 ?'[]'$  - 0 ?Nutritional Assessment / Counseling / Intervention ?'[]'$  - 0 ?Lower Extremity Assessment (monofilament, tuning fork, pulses) ?'[]'$  - 0 ?Peripheral Arterial Disease Assessment (using hand held doppler) ?ASSESSMENTS - Ostomy and/or Continence Assessment and Care ?'[]'$  - Incontinence Assessment and Management 0 ?'[]'$  - 0 ?Ostomy Care Assessment and Management (repouching, etc.) ?PROCESS - Coordination of Care ?X - Simple Patient / Family Education for ongoing care 1 15 ?'[]'$  - 0 ?Complex (extensive) Patient / Family Education for ongoing care ?'[]'$  - 0 ?Staff obtains Consents, Records, Test Results / Process Orders ?'[]'$  - 0 ?Staff telephones HHA, Nursing Homes / Clarify orders / etc ?'[]'$  - 0 ?Routine Transfer to another Facility (non-emergent condition) ?'[]'$  - 0 ?Routine Hospital Admission (non-emergent condition) ?'[]'$  - 0 ?New Admissions / Biomedical engineer / Ordering NPWT, Apligraf, etc. ?'[]'$  - 0 ?Emergency Hospital Admission (emergent condition) ?'[]'$  - 0 ?Simple Discharge Coordination ?'[]'$  - 0 ?Complex (extensive) Discharge Coordination ?PROCESS - Special Needs ?'[]'$  - Pediatric / Minor Patient Management 0 ?'[]'$  - 0 ?Isolation Patient Management ?'[]'$  - 0 ?Hearing / Language / Visual  special needs ?'[]'$  - 0 ?Assessment of Community assistance (transportation, D/C planning, etc.) ?'[]'$  - 0 ?Additional assistance / Altered mentation ?'[]'$  - 0 ?Support Surface(s) Assessment (bed, cushion, seat, etc.) ?INTERVENTIONS -  Wound Cleansing / Measurement ?[name], [name] ([id number]) ?X- 1 5 ?Simple Wound Cleansing - one wound ?'[]'$  - 0 ?Complex Wound Cleansing - multiple wounds ?'[]'$  - 0 ?Wound Imaging (photographs - any number of wounds) ?'[]'$  - 0 ?Wound Tracing (instead of photographs) ?'[]'$  - 0 ?Simple Wound Measurement - one wound ?'[]'$  - 0 ?Complex Wound Measurement - multiple wounds ?INTERVENTIONS - Wound Dressings ?X - Small Wound Dressing one or multiple wounds 1 10 ?'[]'$  - 0 ?Medium Wound Dressing one or multiple wounds ?'[]'$  - 0 ?Large Wound Dressing one or multiple wounds ?'[]'$  - 0 ?Application of Medications - topical ?'[]'$  - 0 ?Application of Medications - injection ?INTERVENTIONS - Miscellaneous ?'[]'$  - External ear exam 0 ?'[]'$  - 0 ?Specimen Collection (cultures, biopsies, blood, body fluids, etc.) ?'[]'$  - 0 ?Specimen(s) / Culture(s) sent or taken to Lab for analysis ?'[]'$  - 0 ?Patient Transfer (multiple staff / Civil Service fast streamer / Similar devices) ?'[]'$  - 0 ?Simple Staple / Suture removal (25 or less) ?'[]'$  - 0 ?Complex Staple / Suture removal (26 or more) ?'[]'$  - 0 ?Hypo / Hyperglycemic Management (close monitor of Blood Glucose) ?'[]'$  - 0 ?Ankle / Brachial Index (ABI) - do not check if billed separately ?X- 1 5 ?Vital Signs ?Has the patient been seen at the hospital within the last three years: Yes ?Total Score: 55 ?Level Of Care: New/Established - Level ?2 ?Electronic Signature(s) ?Signed: 06/20/2021 4:01:15 PM By: Carlene Coria RN ?Entered ByCarlene Coria on 06/19/2021 13:38:25 ?[name], Dawn B. ([id number]) ?-------------------------------------------------------------------------------- ?Encounter Discharge Information Details ?Patient Name: [name], [name] ?Date of Service: 06/19/2021 1:15 PM ?Medical Record Number:  [id number] ?Patient Account Number: [id number] ?Date of Birth/Sex: 1951-03-12 (72 y.o. F) ?Treating RN: Carlene Coria ?Primary Care Tejon Gracie: [name], Olivia Mackie Other Clinician: ?Referring Eriona Kinchen: [name], Olivia Mackie ?Treating Yesennia Hirota/Extender: Jeri Cos ?Weeks in Treatment: 50 ?Encounter Discharge Information Items ?Discharge Condition: Stable ?Ambulatory Status: Wheelchair ?Discharge Destination: Home ?Transportation: Private Auto ?Accompanied By: husband ?Schedule Follow-up Appointment: Yes ?Clinical Summary of Care: Patient Declined ?Electronic Signature(s) ?Signed: 06/20/2021 4:01:15 PM By: Carlene Coria RN ?Entered By: Carlene Coria on 06/19/2021 13:40:04 ?[name], Dawn B. ([id number]) ?-------------------------------------------------------------------------------- ?Lower Extremity Assessment Details ?Patient Name: [name], [name] ?Date of Service: 06/19/2021 1:15 PM ?Medical Record Number: [id number] ?Patient Account Number: [id number] ?Date of Birth/Sex: [birth year] (71 y.o. F) ?Treating RN: Carlene Coria ?Primary Care Ella Guillotte: [name], Olivia Mackie Other Clinician: ?Referring Loye Reininger: [name], Olivia Mackie ?Treating Catera Hankins/Extender: Jeri Cos ?Weeks in Treatment: 50 ?Edema Assessment ?Assessed: [Left: No] [Right: No] ?[Left: Edema] [Right: :] ?Calf ?Left: Right: ?Point of Measurement: 32 cm From Medial Instep 36 cm ?Ankle ?Left: Right: ?Point of Measurement: 9 cm From Medial Instep 19.5 cm ?Electronic Signature(s) ?Signed: 06/20/2021 4:01:15 PM By: Carlene Coria RN ?Entered By: Carlene Coria on 06/19/2021 13:36:41 ?[name], Dawn B. ([id number]) ?-------------------------------------------------------------------------------- ?Multi Wound Chart Details ?Patient Name: [name], [name] ?Date of Service: 06/19/2021 1:15 PM ?Medical Record Number: [id number] ?Patient Account Number: [id number] ?Date of Birth/Sex: 1950/11/22 (71 y.o. F) ?Treating RN: Carlene Coria ?Primary Care Adalia Pettis: [name], Olivia Mackie  Other Clinician: ?Referring Trentin Knappenberger: [name], Olivia Mackie ?Treating Kaylenn Civil/Extender: Jeri Cos ?Weeks in Treatment: 50 ?Vital Signs ?Height(in): 65.5 ?Pulse(bpm): 64 ?Weight(lbs): 240 ?Blood Pressure(mmHg): 1

## 2021-06-20 NOTE — Patient Instructions (Signed)
Medication Instructions:  ?No changes at this time. ? ?*If you need a refill on your cardiac medications before your next appointment, please call your pharmacy* ? ? ?Lab Work: ?BMET today ? ?If you have labs (blood work) drawn today and your tests are completely normal, you will receive your results only by: ?MyChart Message (if you have MyChart) OR ?A paper copy in the mail ?If you have any lab test that is abnormal or we need to change your treatment, we will call you to review the results. ? ? ?Testing/Procedures: ?None ? ? ?Follow-Up: ?At [facility], you and your health needs are our priority.  As part of our continuing mission to provide you with exceptional heart care, we have created designated Provider Care Teams.  These Care Teams include your primary Cardiologist (physician) and Advanced Practice Providers (APPs -  Physician Assistants and Nurse Practitioners) who all work together to provide you with the care you need, when you need it. ? ? ?Your next appointment:   ?1 month(s) ? ?The format for your next appointment:   ?In Person ? ?Provider:   ?Kathlyn Sacramento, MD or Murray Hodgkins, NP  ?

## 2021-06-20 NOTE — Progress Notes (Signed)
? ? ?Office Visit  ?  ?Patient Name: [name] ?Date of Encounter: 06/20/2021 ? ?Primary Care Provider:  [name], [name], MD ?Primary Cardiologist:  Kathlyn Sacramento, MD ? ?Chief Complaint  ?  ?71 year old female with a history of paroxysmal atrial fibrillation, prior strokes, hypertension, hyperlipidemia, diabetes, obesity, chronic pain, DVT, sleep apnea on CPAP, and obesity, who presents for follow-up after recent hospitalization for acute on chronic HFpEF, lower extremity cellulitis, and urinary tract infection. ? ?Past Medical History  ?  ?Past Medical History:  ?Diagnosis Date  ? Abnormal antibody titer   ? Anxiety and depression   ? Arthritis   ? Asthma   ? Atypical chest pain   ? a. 03/2018 MV: No ischemia/infarct. EF >65%.  ? Carotid arterial disease ([place])   ? a. 05/1515 U/S: RICA <61, LICA nl.  ? Chronic heart failure with preserved ejection fraction (HFpEF) ([place])   ? a. 08/2013 Echo: EF 60-65%; b. 08/2019 Echo: EF 60-65%; c. 03/2020 Echo: EF 60-65%; d. 05/2021 Echo: EF 55-60%, no rmwa, nl RV fxn, RVSP 54.12mHg. Mildly dil LA, mild MR, mod TR.  ? Cystocele   ? Depression   ? Diabetes ([place]   ? DVT (deep venous thrombosis) ([place]   ? Righ calf  ? Dyspnea   ? 11/08/2018 - "more now due to lack of exercise"  ? Edema   ? GERD (gastroesophageal reflux disease)   ? Heart murmur   ? History of IBS   ? History of kidney stones   ? History of methicillin resistant staphylococcus aureus (MRSA) 2008  ? History of multiple strokes   ? Hyperlipidemia   ? Hypertension   ? Hypothyroidism   ? Insomnia   ? Kidney stone   ? kidney stones with lithotripsy .  [facility]- "adrenal glands"  ? Mitral regurgitation   ? a. 05/2021 Echo: Mild MR.  ? Neuropathy involving both lower extremities   ? Non-healing ulcer of left foot ([place]   ? Obesity, Class II, BMI 35-39.9, with comorbidity   ? PAF (paroxysmal atrial fibrillation) ([place]   ? a. 07/2013 Event monitor: Freq PVCs and 3 short runs of Afib-->CHA2DS2VASc = 7-->Eliquis.   ? PAH (pulmonary artery hypertension) ([place]   ? a. 05/2021 Echo: RVSP 54.451mg.  ? Peripheral vascular disease ([facility]  ? a. 03/2018 LE Angio: Nl renal arteries. Nl aorta & iliacs. Nl LLE arterial flow; b. 06/2020 ABI's: Nl bilat.  ? Sleep apnea   ? use C-PAP  ? [facility]  ? 03/2020  ? Tricuspid regurgitation   ? a. 05/2021 Echo: Mod TR (in setting of RVSP 54.47m51m).  ? Urinary incontinence   ? Venous stasis dermatitis of both lower extremities   ? ?Past Surgical History:  ?Procedure Laterality Date  ? ANKLE SURGERY Right   ? APPLICATION OF WOUND VAC Left 06/27/2015  ? Procedure: APPLICATION OF WOUND VAC ( POSSIBLE ) ;  Surgeon: JasAlgernon HuxleyD;  Location: [facility] ORS;  Service: Vascular;  Laterality: Left;  ? APPLICATION OF WOUND VAC Left 09/25/2016  ? Procedure: APPLICATION OF WOUND VAC;  Surgeon: TroAlbertine PatriciaPM;  Location: [facility]S;  Service: Podiatry;  Laterality: Left;  ? arm surgery    ? right    ? CHOLECYSTECTOMY    ? COLONOSCOPY    ? COLONOSCOPY WITH PROPOFOL N/A 01/11/2017  ? Procedure: COLONOSCOPY WITH PROPOFOL;  Surgeon: SkuLollie SailsD;  [facility];  Service: Endoscopy;  Laterality: N/A;  ?  COLONOSCOPY WITH PROPOFOL N/A 02/22/2017  ? Procedure: COLONOSCOPY WITH PROPOFOL;  Surgeon: [name], MD;  Location: [facility]COPY;  Service: Endoscopy;  Laterality: N/A;  ? COLONOSCOPY WITH PROPOFOL N/A 05/31/2017  ? Procedure: COLONOSCOPY WITH PROPOFOL;  Surgeon: [name], MD;  Location: [facility] ENDOSCOPY;  Service: Endoscopy;  Laterality: N/A;  ? ESOPHAGOGASTRODUODENOSCOPY (EGD) WITH PROPOFOL N/A 01/11/2017  ? Procedure: ESOPHAGOGASTRODUODENOSCOPY (EGD) WITH PROPOFOL;  Surgeon: [name], MD;  Location: [facility] ENDOSCOPY;  Service: Endoscopy;  Laterality: N/A;  ? ESOPHAGOGASTRODUODENOSCOPY (EGD) WITH PROPOFOL N/A 10/25/2018  ? Procedure: ESOPHAGOGASTRODUODENOSCOPY (EGD) WITH PROPOFOL;  Surgeon: [name], MD;  Location: [facility] ENDOSCOPY;  Service: Endoscopy;   Laterality: N/A;  ? ESOPHAGOGASTRODUODENOSCOPY (EGD) WITH PROPOFOL N/A 11/29/2019  ? Procedure: ESOPHAGOGASTRODUODENOSCOPY (EGD) WITH PROPOFOL;  Surgeon: Toledo, Benay Pike, MD;  Location: [facility] ENDOSCOPY;  Service: Gastroenterology;  Laterality: N/A;  ? HERNIA REPAIR    ? umbilical  ? HIATAL HERNIA REPAIR    ? I & D EXTREMITY Left 06/27/2015  ? Procedure: IRRIGATION AND DEBRIDEMENT EXTREMITY            ( CALF HEMATOMA ) POSSIBLE WOUND VAC;  Surgeon: [name], MD;  Location: [facility] ORS;  Service: Vascular;  Laterality: Left;  ? INCISION AND DRAINAGE OF WOUND Left 09/25/2016  ? Procedure: IRRIGATION AND DEBRIDEMENT - PARTIAL RESECTION OF ACHILLES TENDON WITH WOUND VAC APPLICATION;  Surgeon: [name], DPM;  Location: [facility] ORS;  Service: Podiatry;  Laterality: Left;  ? INCISION AND DRAINAGE OF WOUND Left 11/09/2018  ? Procedure: Excision of left foot wound with ACell placement;  Surgeon: Wallace Going, DO;  Location: [place];  Service: Plastics;  Laterality: Left;  ? IRRIGATION AND DEBRIDEMENT ABSCESS Left 09/07/2016  ? Procedure: IRRIGATION AND DEBRIDEMENT ABSCESS LEFT HEEL;  Surgeon: [name], DPM;  Location: [facility];  Service: Podiatry;  Laterality: Left;  ? JOINT REPLACEMENT  2013  ? left knee replacement  ? KIDNEY SURGERY Right   ? kidney stones  ? LITHOTRIPSY    ? LOWER EXTREMITY ANGIOGRAPHY Left 04/04/2018  ? Procedure: LOWER EXTREMITY ANGIOGRAPHY;  Surgeon: [name], MD;  Location: [place] CV LAB;  Service: Cardiovascular;  Laterality: Left;  ? NM [facility] HX)  February 2017  ? Likely breast attenuation. LOW RISK study. Normal EF 55-60%.  ? OTHER SURGICAL HISTORY    ? 08/2016 or 09/2016 surgery on achilles tenso h/o staph infection heal. Dr. [name]  ? removal of left hematoma Left   ? leg  ? REVERSE SHOULDER ARTHROPLASTY Right 10/08/2015  ? Procedure: REVERSE SHOULDER ARTHROPLASTY;  Surgeon: [name], MD;  Location: [facility] ORS;  Service: Orthopedics;  Laterality: Right;  ?  SLEEVE GASTROPLASTY    ? TONSILLECTOMY    ? TOTAL KNEE ARTHROPLASTY Left   ? TOTAL SHOULDER REPLACEMENT Right   ? TRANSESOPHAGEAL ECHOCARDIOGRAM  08/08/2013  ? Mild LVH, EF 60-65%. Moderate LA dilation and mild RA dilation. Mild MR with no evidence of stenosis and no evidence of endocarditis. A false chordae is noted.  ? TRANSTHORACIC ECHOCARDIOGRAM  08/03/2013  ? Mild-Moderate concentric LVH, EF 60-65%. Normal diastolic function. Mild LA dilation. Mild MR with possible vegetation  - not confirmed on TEE   ? ULNAR NERVE TRANSPOSITION Right 08/06/2016  ? Procedure: ULNAR NERVE DECOMPRESSION/TRANSPOSITION;  Surgeon: [name], MD;  Location: [facility] ORS;  Service: Orthopedics;  Laterality: Right;  ? UPPER GI ENDOSCOPY    ? VAGINAL HYSTERECTOMY    ? ? ?  Allergies ? ?Allergies  ?Allergen Reactions  ? Morphine And Related Other (See Comments) and Nausea Only  ?  Patient becomes very confused ?Other reaction(s): Other (See Comments), Other (See Comments) ?Patient becomes very confused ?Patient becomes very confused ?  ? Penicillins Hives, Shortness Of Breath, Swelling and Other (See Comments)  ?  Facial swelling ?Has patient had a PCN reaction causing immediate rash, facial/tongue/throat swelling, SOB or lightheadedness with hypotension: Yes ?Has patient had a PCN reaction causing severe rash involving mucus membranes or skin necrosis: Yes ?Has patient had a PCN reaction that required hospitalization No ?Has patient had a PCN reaction occurring within the last 10 years: No ?If all of the above answers are "NO", then may proceed with Cephalosporin use. ? ?Other reaction(s): Other (See Comments), Other (See Comments) ?Facial swelling ?Has patient had a PCN reaction causing immediate rash, facial/tongue/throat swelling, SOB or lightheadedness with hypotension: Yes ?Has patient had a PCN reaction causing severe rash involving mucus membranes or skin necrosis: Yes ?Has patient had a PCN reaction that required hospitalization  No ?Has patient had a PCN reaction occurring within the last 10 years: No ?If all of the above answers are "NO", then may proceed with Cephalosporin use. ?Facial swelling ?Has patient had a PCN reaction causing immed

## 2021-06-21 LAB — BASIC METABOLIC PANEL
BUN/Creatinine Ratio: 27 (ref 12–28)
BUN: 30 mg/dL — ABNORMAL HIGH (ref 8–27)
CO2: 21 mmol/L (ref 20–29)
Calcium: 9.1 mg/dL (ref 8.7–10.3)
Chloride: 101 mmol/L (ref 96–106)
Creatinine, Ser: 1.12 mg/dL — ABNORMAL HIGH (ref 0.57–1.00)
Glucose: 325 mg/dL — ABNORMAL HIGH (ref 70–99)
Potassium: 4.6 mmol/L (ref 3.5–5.2)
Sodium: 139 mmol/L (ref 134–144)
eGFR: 53 mL/min/{1.73_m2} — ABNORMAL LOW (ref >59)

## 2021-06-23 ENCOUNTER — Encounter: Payer: Self-pay | Admitting: Oncology

## 2021-06-23 ENCOUNTER — Inpatient Hospital Stay (HOSPITAL_BASED_OUTPATIENT_CLINIC_OR_DEPARTMENT_OTHER): Payer: Medicare HMO | Admitting: Oncology

## 2021-06-23 ENCOUNTER — Inpatient Hospital Stay: Payer: Medicare HMO | Attending: Oncology

## 2021-06-23 VITALS — BP 119/54 | HR 60 | Temp 96.9°F | Resp 20 | Wt 258.5 lb

## 2021-06-23 DIAGNOSIS — I2721 Secondary pulmonary arterial hypertension: Secondary | ICD-10-CM | POA: Diagnosis not present

## 2021-06-23 DIAGNOSIS — E039 Hypothyroidism, unspecified: Secondary | ICD-10-CM | POA: Diagnosis not present

## 2021-06-23 DIAGNOSIS — I5032 Chronic diastolic (congestive) heart failure: Secondary | ICD-10-CM | POA: Insufficient documentation

## 2021-06-23 DIAGNOSIS — G4730 Sleep apnea, unspecified: Secondary | ICD-10-CM | POA: Insufficient documentation

## 2021-06-23 DIAGNOSIS — D509 Iron deficiency anemia, unspecified: Secondary | ICD-10-CM | POA: Insufficient documentation

## 2021-06-23 DIAGNOSIS — K219 Gastro-esophageal reflux disease without esophagitis: Secondary | ICD-10-CM | POA: Diagnosis not present

## 2021-06-23 DIAGNOSIS — Z803 Family history of malignant neoplasm of breast: Secondary | ICD-10-CM | POA: Insufficient documentation

## 2021-06-23 DIAGNOSIS — Z8673 Personal history of transient ischemic attack (TIA), and cerebral infarction without residual deficits: Secondary | ICD-10-CM | POA: Diagnosis not present

## 2021-06-23 DIAGNOSIS — Z79899 Other long term (current) drug therapy: Secondary | ICD-10-CM | POA: Insufficient documentation

## 2021-06-23 DIAGNOSIS — Z86718 Personal history of other venous thrombosis and embolism: Secondary | ICD-10-CM | POA: Insufficient documentation

## 2021-06-23 DIAGNOSIS — Z8614 Personal history of Methicillin resistant Staphylococcus aureus infection: Secondary | ICD-10-CM | POA: Diagnosis not present

## 2021-06-23 DIAGNOSIS — Z7901 Long term (current) use of anticoagulants: Secondary | ICD-10-CM | POA: Diagnosis not present

## 2021-06-23 DIAGNOSIS — E1151 Type 2 diabetes mellitus with diabetic peripheral angiopathy without gangrene: Secondary | ICD-10-CM | POA: Diagnosis not present

## 2021-06-23 DIAGNOSIS — I480 Paroxysmal atrial fibrillation: Secondary | ICD-10-CM | POA: Diagnosis not present

## 2021-06-23 DIAGNOSIS — E785 Hyperlipidemia, unspecified: Secondary | ICD-10-CM | POA: Diagnosis not present

## 2021-06-23 DIAGNOSIS — Z87442 Personal history of urinary calculi: Secondary | ICD-10-CM | POA: Diagnosis not present

## 2021-06-23 DIAGNOSIS — I110 Hypertensive heart disease with heart failure: Secondary | ICD-10-CM | POA: Diagnosis not present

## 2021-06-23 DIAGNOSIS — D508 Other iron deficiency anemias: Secondary | ICD-10-CM

## 2021-06-23 DIAGNOSIS — E669 Obesity, unspecified: Secondary | ICD-10-CM | POA: Diagnosis not present

## 2021-06-23 DIAGNOSIS — G4700 Insomnia, unspecified: Secondary | ICD-10-CM | POA: Insufficient documentation

## 2021-06-23 LAB — CBC WITH DIFFERENTIAL/PLATELET
Abs Immature Granulocytes: 0.01 10*3/uL (ref 0.00–0.07)
Basophils Absolute: 0.1 10*3/uL (ref 0.0–0.1)
Basophils Relative: 1 %
Eosinophils Absolute: 0.2 10*3/uL (ref 0.0–0.5)
Eosinophils Relative: 4 %
HCT: 38 % (ref 36.0–46.0)
Hemoglobin: 11.7 g/dL — ABNORMAL LOW (ref 12.0–15.0)
Immature Granulocytes: 0 %
Lymphocytes Relative: 22 %
Lymphs Abs: 1.1 10*3/uL (ref 0.7–4.0)
MCH: 26 pg (ref 26.0–34.0)
MCHC: 30.8 g/dL (ref 30.0–36.0)
MCV: 84.4 fL (ref 80.0–100.0)
Monocytes Absolute: 0.6 10*3/uL (ref 0.1–1.0)
Monocytes Relative: 11 %
Neutro Abs: 3.1 10*3/uL (ref 1.7–7.7)
Neutrophils Relative %: 62 %
Platelets: 208 10*3/uL (ref 150–400)
RBC: 4.5 MIL/uL (ref 3.87–5.11)
RDW: 21.2 % — ABNORMAL HIGH (ref 11.5–15.5)
WBC: 5.1 10*3/uL (ref 4.0–10.5)
nRBC: 0 % (ref 0.0–0.2)

## 2021-06-23 LAB — IRON AND TIBC
Iron: 79 ug/dL (ref 28–170)
Saturation Ratios: 18 % (ref 10.4–31.8)
TIBC: 442 ug/dL (ref 250–450)
UIBC: 363 ug/dL

## 2021-06-23 LAB — FERRITIN: Ferritin: 59 ng/mL (ref 11–307)

## 2021-06-23 NOTE — Progress Notes (Signed)
? ? ? ?Hematology/Oncology Consult note ?[place]  ?[phone] [phone] ? ?Patient Care Team: ?[name], [name], MD as PCP - General (Internal Medicine) ?Wellington Hampshire, MD as PCP - Cardiology (Cardiology) ?[name], MD as Consulting Physician (Cardiology) ?Poggi, Marshall Cork, MD as Consulting Physician (Surgery) ?Sindy Guadeloupe, MD as Consulting Physician (Oncology)  ? ?Name of the patient: [name]  ?[id number]  ?11-19-1950  ? ?Date of visit: 06/23/21 ? ?Diagnosis-iron deficiency anemia ? ?Chief complaint/ Reason for visit-routine follow-up of iron deficiency anemia ? ?Heme/Onc history: Patient is a 71 year old female with a history of sleep apnea, bronchitis, diabetes CVA who has been referred for anemia.  Most recent CBC from June 2022 showed white cell count of 7.5, H&H of 11.1/34.5 with an MCV of 80.9 and a platelet count of 257.  Looking back at her prior CBCs her hemoglobin has been mostly between 9.5-10.5.  B12 levels in June was greater than 1550.  Folate was normal iron studies showed a low ferritin of 18 and iron saturation of 11%.  Patient currently reports fatigue which has been more than usual along with exertional shortness of breath.  Denies any blood loss in her stool or urine.  Denies any dark melanotic stools.  Last EGD was in September 2021 by Dr. Alice Reichert which showed possible candidiasis, benign-appearing esophageal stenosis and multiple gastric polyps which were negative for malignancy.  Last colonoscopy was in March 2019 which showed diverticulosis and polyps but no evidence of active bleeding. ?  ?Results of blood workroutine follow-up of anemia from 03/25/2021 were as follows: CBC showed white cell count of 7.6, H&H of 8.6/29.6 with an MCV of 78 and a platelet count of 239.  Ferritin levels were low at 6.  B12 level normal at 563.  No evidence of hemolysis.  Myeloma panel showed no evidence of M protein.  TSH was mildly low at  0.14.  Patient received IV iron Venofer in February 2023. ? ? ?Interval history-reports some improvement in her energy levels after receiving IV iron.  Denies nosebleeds vaginal bleeding or GI bleeding. ? ?ECOG PS- 2 ?Pain scale- 0 ? ? ?Review of systems- Review of Systems  ?Constitutional:  Positive for malaise/fatigue. Negative for chills, fever and weight loss.  ?HENT:  Negative for congestion, ear discharge and nosebleeds.   ?Eyes:  Negative for blurred vision.  ?Respiratory:  Negative for cough, hemoptysis, sputum production, shortness of breath and wheezing.   ?Cardiovascular:  Negative for chest pain, palpitations, orthopnea and claudication.  ?Gastrointestinal:  Negative for abdominal pain, blood in stool, constipation, diarrhea, heartburn, melena, nausea and vomiting.  ?Genitourinary:  Negative for dysuria, flank pain, frequency, hematuria and urgency.  ?Musculoskeletal:  Negative for back pain, joint pain and myalgias.  ?Skin:  Negative for rash.  ?Neurological:  Negative for dizziness, tingling, focal weakness, seizures, weakness and headaches.  ?Endo/Heme/Allergies:  Does not bruise/bleed easily.  ?Psychiatric/Behavioral:  Negative for depression and suicidal ideas. The patient does not have insomnia.    ? ? ?Allergies  ?Allergen Reactions  ? Morphine And Related Other (See Comments) and Nausea Only  ?  Patient becomes very confused ?Other reaction(s): Other (See Comments), Other (See Comments) ?Patient becomes very confused ?Patient becomes very confused ?  ? Penicillins Hives, Shortness Of Breath, Swelling and Other (See Comments)  ?  Facial swelling ?Has patient had a PCN reaction causing immediate rash, facial/tongue/throat swelling, SOB or lightheadedness with hypotension: Yes ?Has patient had a PCN reaction  causing severe rash involving mucus membranes or skin necrosis: Yes ?Has patient had a PCN reaction that required hospitalization No ?Has patient had a PCN reaction occurring within the last 10  years: No ?If all of the above answers are "NO", then may proceed with Cephalosporin use. ? ?Other reaction(s): Other (See Comments), Other (See Comments) ?Facial swelling ?Has patient had a PCN reaction causing immediate rash, facial/tongue/throat swelling, SOB or lightheadedness with hypotension: Yes ?Has patient had a PCN reaction causing severe rash involving mucus membranes or skin necrosis: Yes ?Has patient had a PCN reaction that required hospitalization No ?Has patient had a PCN reaction occurring within the last 10 years: No ?If all of the above answers are "NO", then may proceed with Cephalosporin use. ?Facial swelling ?Has patient had a PCN reaction causing immediate rash, facial/tongue/throat swelling, SOB or lightheadedness with hypotension: Yes ?Has patient had a PCN reaction causing severe rash involving mucus membranes or skin necrosis: Yes ?Has patient had a PCN reaction that required hospitalization No ?Has patient had a PCN reaction occurring within the last 10 years: No ?If all of the above answers are "NO", then may proceed with Cephalosporin use. ?Facial swelling ?Has patient had a PCN reaction causing immediate rash, facial/tongue/throat swelling, SOB or lightheadedness with hypotension: Yes ?Has patient had a PCN reaction causing severe rash involving mucus membranes or skin necrosis: Yes ?Has patient had a PCN reaction that required hospitalization No ?Has patient had a PCN reaction occurring within the last 10 years: No ?If all of the above answers are "NO", then may proceed with Cephalosporin use. ?  ? Ambien [Zolpidem]   ?  Hallucination   ? Aspirin Hives  ? Oxytetracycline Hives  ?  Other reaction(s): Unknown ?  ? ? ? ?Past Medical History:  ?Diagnosis Date  ? Abnormal antibody titer   ? Anxiety and depression   ? Arthritis   ? Asthma   ? Atypical chest pain   ? a. 03/2018 MV: No ischemia/infarct. EF >65%.  ? Carotid arterial disease ([place])   ? a. 08/5782 U/S: RICA <69, LICA nl.  ? Chronic  heart failure with preserved ejection fraction (HFpEF) ([place])   ? a. 08/2013 Echo: EF 60-65%; b. 08/2019 Echo: EF 60-65%; c. 03/2020 Echo: EF 60-65%; d. 05/2021 Echo: EF 55-60%, no rmwa, nl RV fxn, RVSP 54.31mHg. Mildly dil LA, mild MR, mod TR.  ? Cystocele   ? Depression   ? Diabetes (HG[place]field [place]   ? DVT (deep venous thrombosis) ([place]   ? Righ calf  ? Dyspnea   ? 11/08/2018 - "more now due to lack of exercise"  ? Edema   ? GERD (gastroesophageal reflux disease)   ? Heart murmur   ? History of IBS   ? History of kidney stones   ? History of methicillin resistant staphylococcus aureus (MRSA) 2008  ? History of multiple strokes   ? Hyperlipidemia   ? Hypertension   ? Hypothyroidism   ? Insomnia   ? Kidney stone   ? kidney stones with lithotripsy .  [facility]- "adrenal glands"  ? Mitral regurgitation   ? a. 05/2021 Echo: Mild MR.  ? Neuropathy involving both lower extremities   ? Non-healing ulcer of left foot (HM[place]   ? Obesity, Class II, BMI 35-39.9, with comorbidity   ? PAF (paroxysmal atrial fibrillation) ([place] [place]   ? a. 07/2013 Event monitor: Freq PVCs and 3 short runs of Afib-->CHA2DS2VASc = 7-->Eliquis.  ? PAH (pulmonary artery hypertension) ([place]   ?  a. 05/2021 Echo: RVSP 54.36mHg.  ? Peripheral vascular disease ([place]   ? a. 03/2018 LE Angio: Nl renal arteries. Nl aorta & iliacs. Nl LLE arterial flow; b. 06/2020 ABI's: Nl bilat.  ? Sleep apnea   ? use C-PAP  ? Stroke ([facility]   ? 03/2020  ? Tricuspid regurgitation   ? a. 05/2021 Echo: Mod TR (in setting of RVSP 54.563mg).  ? Urinary incontinence   ? Venous stasis dermatitis of both lower extremities   ? ? ? ?Past Surgical History:  ?Procedure Laterality Date  ? ANKLE SURGERY Right   ? APPLICATION OF WOUND VAC Left 06/27/2015  ? Procedure: APPLICATION OF WOUND VAC ( POSSIBLE ) ;  Surgeon: JaAlgernon HuxleyMD;  Location: [facility] ORS;  Service: Vascular;  Laterality: Left;  ? APPLICATION OF WOUND VAC Left 09/25/2016  ? Procedure: APPLICATION OF WOUND VAC;  Surgeon: TrAlbertine PatriciaDPM;   Location: [facility] ORS;  Service: Podiatry;  Laterality: Left;  ? arm surgery    ? right    ? CHOLECYSTECTOMY    ? COLONOSCOPY    ? COLONOSCOPY WITH PROPOFOL N/A 01/11/2017  ? Procedure: COLONOSCOPY WITH PROPOFO

## 2021-06-23 NOTE — Progress Notes (Signed)
Pt states she has been experiencing a "twitching" sensation in her abdomen, hands and feet involuntarily going on two months. Per husband, he is wondering if it has anything to do with congestive heart failure recently dx in the ER.  ?

## 2021-06-24 DIAGNOSIS — L97812 Non-pressure chronic ulcer of other part of right lower leg with fat layer exposed: Secondary | ICD-10-CM | POA: Diagnosis not present

## 2021-06-24 DIAGNOSIS — E11621 Type 2 diabetes mellitus with foot ulcer: Secondary | ICD-10-CM | POA: Diagnosis not present

## 2021-06-24 DIAGNOSIS — M86572 Other chronic hematogenous osteomyelitis, left ankle and foot: Secondary | ICD-10-CM | POA: Diagnosis not present

## 2021-06-24 DIAGNOSIS — L03115 Cellulitis of right lower limb: Secondary | ICD-10-CM | POA: Diagnosis not present

## 2021-06-24 DIAGNOSIS — L97522 Non-pressure chronic ulcer of other part of left foot with fat layer exposed: Secondary | ICD-10-CM | POA: Diagnosis not present

## 2021-06-24 DIAGNOSIS — I152 Hypertension secondary to endocrine disorders: Secondary | ICD-10-CM | POA: Diagnosis not present

## 2021-06-24 DIAGNOSIS — J9601 Acute respiratory failure with hypoxia: Secondary | ICD-10-CM | POA: Diagnosis not present

## 2021-06-24 DIAGNOSIS — E1142 Type 2 diabetes mellitus with diabetic polyneuropathy: Secondary | ICD-10-CM | POA: Diagnosis not present

## 2021-06-24 DIAGNOSIS — I69320 Aphasia following cerebral infarction: Secondary | ICD-10-CM | POA: Diagnosis not present

## 2021-06-24 DIAGNOSIS — N390 Urinary tract infection, site not specified: Secondary | ICD-10-CM | POA: Diagnosis not present

## 2021-06-24 DIAGNOSIS — I69319 Unspecified symptoms and signs involving cognitive functions following cerebral infarction: Secondary | ICD-10-CM | POA: Diagnosis not present

## 2021-06-24 DIAGNOSIS — F419 Anxiety disorder, unspecified: Secondary | ICD-10-CM | POA: Diagnosis not present

## 2021-06-24 DIAGNOSIS — I872 Venous insufficiency (chronic) (peripheral): Secondary | ICD-10-CM | POA: Diagnosis not present

## 2021-06-24 DIAGNOSIS — L03116 Cellulitis of left lower limb: Secondary | ICD-10-CM | POA: Diagnosis not present

## 2021-06-24 DIAGNOSIS — E1159 Type 2 diabetes mellitus with other circulatory complications: Secondary | ICD-10-CM | POA: Diagnosis not present

## 2021-06-24 DIAGNOSIS — I5033 Acute on chronic diastolic (congestive) heart failure: Secondary | ICD-10-CM | POA: Diagnosis not present

## 2021-06-24 DIAGNOSIS — G319 Degenerative disease of nervous system, unspecified: Secondary | ICD-10-CM | POA: Diagnosis not present

## 2021-06-24 DIAGNOSIS — F339 Major depressive disorder, recurrent, unspecified: Secondary | ICD-10-CM | POA: Diagnosis not present

## 2021-06-24 DIAGNOSIS — J449 Chronic obstructive pulmonary disease, unspecified: Secondary | ICD-10-CM | POA: Diagnosis not present

## 2021-06-24 DIAGNOSIS — Z7984 Long term (current) use of oral hypoglycemic drugs: Secondary | ICD-10-CM | POA: Diagnosis not present

## 2021-06-24 DIAGNOSIS — Z6841 Body Mass Index (BMI) 40.0 and over, adult: Secondary | ICD-10-CM | POA: Diagnosis not present

## 2021-06-24 DIAGNOSIS — E1152 Type 2 diabetes mellitus with diabetic peripheral angiopathy with gangrene: Secondary | ICD-10-CM | POA: Diagnosis not present

## 2021-06-24 DIAGNOSIS — I4819 Other persistent atrial fibrillation: Secondary | ICD-10-CM | POA: Diagnosis not present

## 2021-06-24 DIAGNOSIS — G8194 Hemiplegia, unspecified affecting left nondominant side: Secondary | ICD-10-CM | POA: Diagnosis not present

## 2021-06-25 ENCOUNTER — Telehealth: Payer: Self-pay | Admitting: Internal Medicine

## 2021-06-25 ENCOUNTER — Telehealth: Payer: Self-pay | Admitting: *Deleted

## 2021-06-25 NOTE — Telephone Encounter (Signed)
Zacharia from [facility] is caling about verbals that were faxed on yesterday ?

## 2021-06-25 NOTE — Telephone Encounter (Signed)
Per dr. Janese Banks that pt hgb dropped in short time. Pt needs to be seen with gi. Pt has seen christiane london. I sent fax with md notes to see if she can be seen. Fax went with confirmation it went through ?

## 2021-06-26 ENCOUNTER — Other Ambulatory Visit: Payer: Self-pay | Admitting: Internal Medicine

## 2021-06-26 DIAGNOSIS — E1142 Type 2 diabetes mellitus with diabetic polyneuropathy: Secondary | ICD-10-CM

## 2021-06-26 DIAGNOSIS — M797 Fibromyalgia: Secondary | ICD-10-CM

## 2021-06-26 MED ORDER — PREGABALIN 50 MG PO CAPS: ORAL_CAPSULE | ORAL | 0 refills | Status: DC

## 2021-06-26 NOTE — Telephone Encounter (Signed)
Did not see this in your box today or in your electronic fax folder. Do you have these orders?  ?

## 2021-06-27 DIAGNOSIS — F339 Major depressive disorder, recurrent, unspecified: Secondary | ICD-10-CM | POA: Diagnosis not present

## 2021-06-27 DIAGNOSIS — L97812 Non-pressure chronic ulcer of other part of right lower leg with fat layer exposed: Secondary | ICD-10-CM | POA: Diagnosis not present

## 2021-06-27 DIAGNOSIS — G8194 Hemiplegia, unspecified affecting left nondominant side: Secondary | ICD-10-CM | POA: Diagnosis not present

## 2021-06-27 DIAGNOSIS — N390 Urinary tract infection, site not specified: Secondary | ICD-10-CM | POA: Diagnosis not present

## 2021-06-27 DIAGNOSIS — I4819 Other persistent atrial fibrillation: Secondary | ICD-10-CM | POA: Diagnosis not present

## 2021-06-27 DIAGNOSIS — I872 Venous insufficiency (chronic) (peripheral): Secondary | ICD-10-CM | POA: Diagnosis not present

## 2021-06-27 DIAGNOSIS — I5033 Acute on chronic diastolic (congestive) heart failure: Secondary | ICD-10-CM | POA: Diagnosis not present

## 2021-06-27 DIAGNOSIS — F419 Anxiety disorder, unspecified: Secondary | ICD-10-CM | POA: Diagnosis not present

## 2021-06-27 DIAGNOSIS — Z6841 Body Mass Index (BMI) 40.0 and over, adult: Secondary | ICD-10-CM | POA: Diagnosis not present

## 2021-06-27 DIAGNOSIS — G319 Degenerative disease of nervous system, unspecified: Secondary | ICD-10-CM | POA: Diagnosis not present

## 2021-06-27 DIAGNOSIS — E1159 Type 2 diabetes mellitus with other circulatory complications: Secondary | ICD-10-CM | POA: Diagnosis not present

## 2021-06-27 DIAGNOSIS — I152 Hypertension secondary to endocrine disorders: Secondary | ICD-10-CM | POA: Diagnosis not present

## 2021-06-27 DIAGNOSIS — E1142 Type 2 diabetes mellitus with diabetic polyneuropathy: Secondary | ICD-10-CM | POA: Diagnosis not present

## 2021-06-27 DIAGNOSIS — L03116 Cellulitis of left lower limb: Secondary | ICD-10-CM | POA: Diagnosis not present

## 2021-06-27 DIAGNOSIS — I69319 Unspecified symptoms and signs involving cognitive functions following cerebral infarction: Secondary | ICD-10-CM | POA: Diagnosis not present

## 2021-06-27 DIAGNOSIS — L97522 Non-pressure chronic ulcer of other part of left foot with fat layer exposed: Secondary | ICD-10-CM | POA: Diagnosis not present

## 2021-06-27 DIAGNOSIS — J449 Chronic obstructive pulmonary disease, unspecified: Secondary | ICD-10-CM | POA: Diagnosis not present

## 2021-06-27 DIAGNOSIS — E11621 Type 2 diabetes mellitus with foot ulcer: Secondary | ICD-10-CM | POA: Diagnosis not present

## 2021-06-27 DIAGNOSIS — L03115 Cellulitis of right lower limb: Secondary | ICD-10-CM | POA: Diagnosis not present

## 2021-06-27 DIAGNOSIS — M86572 Other chronic hematogenous osteomyelitis, left ankle and foot: Secondary | ICD-10-CM | POA: Diagnosis not present

## 2021-06-27 DIAGNOSIS — I69320 Aphasia following cerebral infarction: Secondary | ICD-10-CM | POA: Diagnosis not present

## 2021-06-27 DIAGNOSIS — E1152 Type 2 diabetes mellitus with diabetic peripheral angiopathy with gangrene: Secondary | ICD-10-CM | POA: Diagnosis not present

## 2021-06-27 DIAGNOSIS — J9601 Acute respiratory failure with hypoxia: Secondary | ICD-10-CM | POA: Diagnosis not present

## 2021-06-27 DIAGNOSIS — Z7984 Long term (current) use of oral hypoglycemic drugs: Secondary | ICD-10-CM | POA: Diagnosis not present

## 2021-06-30 ENCOUNTER — Ambulatory Visit: Payer: Medicare HMO | Admitting: Obstetrics and Gynecology

## 2021-06-30 NOTE — Telephone Encounter (Signed)
Orders maybe in fax stack if someone could check fax if not call and see what orders needed and have them re-fax to me to sign or ok for verbal if documented reason for verbal orders so we can keep record of this  ? ?Thank you ? ?

## 2021-07-03 ENCOUNTER — Ambulatory Visit: Payer: Medicare HMO

## 2021-07-03 ENCOUNTER — Telehealth: Payer: Self-pay | Admitting: Cardiovascular Disease

## 2021-07-03 DIAGNOSIS — F339 Major depressive disorder, recurrent, unspecified: Secondary | ICD-10-CM | POA: Diagnosis not present

## 2021-07-03 DIAGNOSIS — E1152 Type 2 diabetes mellitus with diabetic peripheral angiopathy with gangrene: Secondary | ICD-10-CM | POA: Diagnosis not present

## 2021-07-03 DIAGNOSIS — I69320 Aphasia following cerebral infarction: Secondary | ICD-10-CM | POA: Diagnosis not present

## 2021-07-03 DIAGNOSIS — Z6841 Body Mass Index (BMI) 40.0 and over, adult: Secondary | ICD-10-CM | POA: Diagnosis not present

## 2021-07-03 DIAGNOSIS — L03116 Cellulitis of left lower limb: Secondary | ICD-10-CM | POA: Diagnosis not present

## 2021-07-03 DIAGNOSIS — G8194 Hemiplegia, unspecified affecting left nondominant side: Secondary | ICD-10-CM | POA: Diagnosis not present

## 2021-07-03 DIAGNOSIS — I5033 Acute on chronic diastolic (congestive) heart failure: Secondary | ICD-10-CM | POA: Diagnosis not present

## 2021-07-03 DIAGNOSIS — I152 Hypertension secondary to endocrine disorders: Secondary | ICD-10-CM | POA: Diagnosis not present

## 2021-07-03 DIAGNOSIS — G319 Degenerative disease of nervous system, unspecified: Secondary | ICD-10-CM | POA: Diagnosis not present

## 2021-07-03 DIAGNOSIS — J9601 Acute respiratory failure with hypoxia: Secondary | ICD-10-CM | POA: Diagnosis not present

## 2021-07-03 DIAGNOSIS — L97522 Non-pressure chronic ulcer of other part of left foot with fat layer exposed: Secondary | ICD-10-CM | POA: Diagnosis not present

## 2021-07-03 DIAGNOSIS — N390 Urinary tract infection, site not specified: Secondary | ICD-10-CM | POA: Diagnosis not present

## 2021-07-03 DIAGNOSIS — I872 Venous insufficiency (chronic) (peripheral): Secondary | ICD-10-CM | POA: Diagnosis not present

## 2021-07-03 DIAGNOSIS — E11621 Type 2 diabetes mellitus with foot ulcer: Secondary | ICD-10-CM | POA: Diagnosis not present

## 2021-07-03 DIAGNOSIS — L03115 Cellulitis of right lower limb: Secondary | ICD-10-CM | POA: Diagnosis not present

## 2021-07-03 DIAGNOSIS — E1159 Type 2 diabetes mellitus with other circulatory complications: Secondary | ICD-10-CM | POA: Diagnosis not present

## 2021-07-03 DIAGNOSIS — J449 Chronic obstructive pulmonary disease, unspecified: Secondary | ICD-10-CM | POA: Diagnosis not present

## 2021-07-03 DIAGNOSIS — F419 Anxiety disorder, unspecified: Secondary | ICD-10-CM | POA: Diagnosis not present

## 2021-07-03 DIAGNOSIS — L97812 Non-pressure chronic ulcer of other part of right lower leg with fat layer exposed: Secondary | ICD-10-CM | POA: Diagnosis not present

## 2021-07-03 DIAGNOSIS — E1142 Type 2 diabetes mellitus with diabetic polyneuropathy: Secondary | ICD-10-CM | POA: Diagnosis not present

## 2021-07-03 DIAGNOSIS — I69319 Unspecified symptoms and signs involving cognitive functions following cerebral infarction: Secondary | ICD-10-CM | POA: Diagnosis not present

## 2021-07-03 DIAGNOSIS — Z7984 Long term (current) use of oral hypoglycemic drugs: Secondary | ICD-10-CM | POA: Diagnosis not present

## 2021-07-03 DIAGNOSIS — M86572 Other chronic hematogenous osteomyelitis, left ankle and foot: Secondary | ICD-10-CM | POA: Diagnosis not present

## 2021-07-03 DIAGNOSIS — I4819 Other persistent atrial fibrillation: Secondary | ICD-10-CM | POA: Diagnosis not present

## 2021-07-03 NOTE — Telephone Encounter (Signed)
Patient has been seen most recently by Ignacia Bayley, NP  and is scheduled to see him in May. Will fwd the message to Gerald Stabs also to review and advise. ?

## 2021-07-03 NOTE — Telephone Encounter (Signed)
Pt c/o swelling: STAT is pt has developed SOB within 24 hours ? ?If swelling, where is the swelling located? Legs to thighs ? ?How much weight have you gained and in what time span? 8 lbs weight gain ? ?Have you gained 3 pounds in a day or 5 pounds in a week? Yes 8 lbs in a week ? ?Do you have a log of your daily weights (if so, list)? 153 lbs last week 161 lbs today ? ?Are you currently taking a fluid pill? Yes, 40 mg lasix daily, took extra 20 mg today ? ?Are you currently SOB? no ? ?Have you traveled recently? No ? ? ?Christy, RN from [facility], calling to inform the patient has had an 8 lb weight gain in a week. She would like the call back to go to her at: [phone] ? ? ?

## 2021-07-03 NOTE — Telephone Encounter (Signed)
Returned call to pt she states that she is having swelling in LE for the last few days. She states that she has not had increased salt in her diet. She only had a small piece of ham on Easter and "just ate broccoli casserole and fruit". She states that she is going to "get on the lymph edema machine this afternoon" she did take an additional '20mg'$  of the lasix today. Her creatinine 06-20-21 was 1.'12mg'$ /dl and 3 weeks ago 1.38. informed pt to take '60mg'$  for the next 2 days and will take weight, BP and HR and call back if this does not help. Her BP has ben running 123/62 - 132/80 for the last 2 days. She will go to the ER if SOB or weight worsens. ?

## 2021-07-08 DIAGNOSIS — L03115 Cellulitis of right lower limb: Secondary | ICD-10-CM | POA: Diagnosis not present

## 2021-07-08 DIAGNOSIS — M86572 Other chronic hematogenous osteomyelitis, left ankle and foot: Secondary | ICD-10-CM | POA: Diagnosis not present

## 2021-07-08 DIAGNOSIS — E1142 Type 2 diabetes mellitus with diabetic polyneuropathy: Secondary | ICD-10-CM | POA: Diagnosis not present

## 2021-07-08 DIAGNOSIS — I152 Hypertension secondary to endocrine disorders: Secondary | ICD-10-CM | POA: Diagnosis not present

## 2021-07-08 DIAGNOSIS — L03116 Cellulitis of left lower limb: Secondary | ICD-10-CM | POA: Diagnosis not present

## 2021-07-08 DIAGNOSIS — E1159 Type 2 diabetes mellitus with other circulatory complications: Secondary | ICD-10-CM | POA: Diagnosis not present

## 2021-07-08 DIAGNOSIS — I69320 Aphasia following cerebral infarction: Secondary | ICD-10-CM | POA: Diagnosis not present

## 2021-07-08 DIAGNOSIS — E1152 Type 2 diabetes mellitus with diabetic peripheral angiopathy with gangrene: Secondary | ICD-10-CM | POA: Diagnosis not present

## 2021-07-08 DIAGNOSIS — L97522 Non-pressure chronic ulcer of other part of left foot with fat layer exposed: Secondary | ICD-10-CM | POA: Diagnosis not present

## 2021-07-08 DIAGNOSIS — Z6841 Body Mass Index (BMI) 40.0 and over, adult: Secondary | ICD-10-CM | POA: Diagnosis not present

## 2021-07-08 DIAGNOSIS — E11621 Type 2 diabetes mellitus with foot ulcer: Secondary | ICD-10-CM | POA: Diagnosis not present

## 2021-07-08 DIAGNOSIS — G319 Degenerative disease of nervous system, unspecified: Secondary | ICD-10-CM | POA: Diagnosis not present

## 2021-07-08 DIAGNOSIS — G8194 Hemiplegia, unspecified affecting left nondominant side: Secondary | ICD-10-CM | POA: Diagnosis not present

## 2021-07-08 DIAGNOSIS — I69319 Unspecified symptoms and signs involving cognitive functions following cerebral infarction: Secondary | ICD-10-CM | POA: Diagnosis not present

## 2021-07-08 DIAGNOSIS — I5033 Acute on chronic diastolic (congestive) heart failure: Secondary | ICD-10-CM | POA: Diagnosis not present

## 2021-07-08 DIAGNOSIS — J449 Chronic obstructive pulmonary disease, unspecified: Secondary | ICD-10-CM | POA: Diagnosis not present

## 2021-07-08 DIAGNOSIS — I4819 Other persistent atrial fibrillation: Secondary | ICD-10-CM | POA: Diagnosis not present

## 2021-07-08 DIAGNOSIS — N390 Urinary tract infection, site not specified: Secondary | ICD-10-CM | POA: Diagnosis not present

## 2021-07-08 DIAGNOSIS — L97812 Non-pressure chronic ulcer of other part of right lower leg with fat layer exposed: Secondary | ICD-10-CM | POA: Diagnosis not present

## 2021-07-08 DIAGNOSIS — F419 Anxiety disorder, unspecified: Secondary | ICD-10-CM | POA: Diagnosis not present

## 2021-07-08 DIAGNOSIS — Z7984 Long term (current) use of oral hypoglycemic drugs: Secondary | ICD-10-CM | POA: Diagnosis not present

## 2021-07-08 DIAGNOSIS — F339 Major depressive disorder, recurrent, unspecified: Secondary | ICD-10-CM | POA: Diagnosis not present

## 2021-07-08 DIAGNOSIS — I872 Venous insufficiency (chronic) (peripheral): Secondary | ICD-10-CM | POA: Diagnosis not present

## 2021-07-08 DIAGNOSIS — J9601 Acute respiratory failure with hypoxia: Secondary | ICD-10-CM | POA: Diagnosis not present

## 2021-07-10 ENCOUNTER — Other Ambulatory Visit: Payer: Self-pay | Admitting: Internal Medicine

## 2021-07-10 ENCOUNTER — Other Ambulatory Visit: Payer: Self-pay | Admitting: Cardiovascular Disease

## 2021-07-10 DIAGNOSIS — I480 Paroxysmal atrial fibrillation: Secondary | ICD-10-CM

## 2021-07-10 DIAGNOSIS — Z7901 Long term (current) use of anticoagulants: Secondary | ICD-10-CM

## 2021-07-10 DIAGNOSIS — F419 Anxiety disorder, unspecified: Secondary | ICD-10-CM

## 2021-07-10 NOTE — Telephone Encounter (Signed)
Refill request

## 2021-07-10 NOTE — Telephone Encounter (Signed)
I did not see orders in the fax stack. Home Health requesting orders for PT ?

## 2021-07-10 NOTE — Telephone Encounter (Signed)
Called [facility] for them to refax orders for PT. They will let them know and will refax orders.  ?

## 2021-07-10 NOTE — Telephone Encounter (Signed)
Prescription refill request for Eliquis received. ?Indication: Atrial Fib ?Last office visit: 06/20/21  Laban Emperor NP ?Scr: 1.12 on 06/20/21 ?Age: 71 ?Weight: 117kg ? ?Based on above findings Eliquis '5mg'$  twice daily is the appropriate dose.  Refill approved. ? ?

## 2021-07-10 NOTE — Telephone Encounter (Signed)
Prescription refill request for Eliquis received. ?Indication: Afib  ?Last office visit: 06/20/21 Sharolyn Douglas) ?Scr: 1.12 (06/20/21) ?Age: 71 ?Weight: 117.3kg ? ?Appropriate dose and refill sent to requested pharmacy.  ?

## 2021-07-11 DIAGNOSIS — E11621 Type 2 diabetes mellitus with foot ulcer: Secondary | ICD-10-CM | POA: Diagnosis not present

## 2021-07-11 DIAGNOSIS — G319 Degenerative disease of nervous system, unspecified: Secondary | ICD-10-CM | POA: Diagnosis not present

## 2021-07-11 DIAGNOSIS — G8194 Hemiplegia, unspecified affecting left nondominant side: Secondary | ICD-10-CM | POA: Diagnosis not present

## 2021-07-11 DIAGNOSIS — I872 Venous insufficiency (chronic) (peripheral): Secondary | ICD-10-CM | POA: Diagnosis not present

## 2021-07-11 DIAGNOSIS — N390 Urinary tract infection, site not specified: Secondary | ICD-10-CM | POA: Diagnosis not present

## 2021-07-11 DIAGNOSIS — Z6841 Body Mass Index (BMI) 40.0 and over, adult: Secondary | ICD-10-CM | POA: Diagnosis not present

## 2021-07-11 DIAGNOSIS — E1142 Type 2 diabetes mellitus with diabetic polyneuropathy: Secondary | ICD-10-CM | POA: Diagnosis not present

## 2021-07-11 DIAGNOSIS — I69320 Aphasia following cerebral infarction: Secondary | ICD-10-CM | POA: Diagnosis not present

## 2021-07-11 DIAGNOSIS — L03115 Cellulitis of right lower limb: Secondary | ICD-10-CM | POA: Diagnosis not present

## 2021-07-11 DIAGNOSIS — I5033 Acute on chronic diastolic (congestive) heart failure: Secondary | ICD-10-CM | POA: Diagnosis not present

## 2021-07-11 DIAGNOSIS — E1152 Type 2 diabetes mellitus with diabetic peripheral angiopathy with gangrene: Secondary | ICD-10-CM | POA: Diagnosis not present

## 2021-07-11 DIAGNOSIS — J449 Chronic obstructive pulmonary disease, unspecified: Secondary | ICD-10-CM | POA: Diagnosis not present

## 2021-07-11 DIAGNOSIS — E1159 Type 2 diabetes mellitus with other circulatory complications: Secondary | ICD-10-CM | POA: Diagnosis not present

## 2021-07-11 DIAGNOSIS — I4819 Other persistent atrial fibrillation: Secondary | ICD-10-CM | POA: Diagnosis not present

## 2021-07-11 DIAGNOSIS — F419 Anxiety disorder, unspecified: Secondary | ICD-10-CM | POA: Diagnosis not present

## 2021-07-11 DIAGNOSIS — Z7984 Long term (current) use of oral hypoglycemic drugs: Secondary | ICD-10-CM | POA: Diagnosis not present

## 2021-07-11 DIAGNOSIS — I152 Hypertension secondary to endocrine disorders: Secondary | ICD-10-CM | POA: Diagnosis not present

## 2021-07-11 DIAGNOSIS — M86572 Other chronic hematogenous osteomyelitis, left ankle and foot: Secondary | ICD-10-CM | POA: Diagnosis not present

## 2021-07-11 DIAGNOSIS — I69319 Unspecified symptoms and signs involving cognitive functions following cerebral infarction: Secondary | ICD-10-CM | POA: Diagnosis not present

## 2021-07-11 DIAGNOSIS — F339 Major depressive disorder, recurrent, unspecified: Secondary | ICD-10-CM | POA: Diagnosis not present

## 2021-07-11 DIAGNOSIS — J9601 Acute respiratory failure with hypoxia: Secondary | ICD-10-CM | POA: Diagnosis not present

## 2021-07-11 DIAGNOSIS — L97812 Non-pressure chronic ulcer of other part of right lower leg with fat layer exposed: Secondary | ICD-10-CM | POA: Diagnosis not present

## 2021-07-11 DIAGNOSIS — L97522 Non-pressure chronic ulcer of other part of left foot with fat layer exposed: Secondary | ICD-10-CM | POA: Diagnosis not present

## 2021-07-11 DIAGNOSIS — L03116 Cellulitis of left lower limb: Secondary | ICD-10-CM | POA: Diagnosis not present

## 2021-07-14 ENCOUNTER — Ambulatory Visit: Payer: Medicare HMO | Admitting: Primary Care

## 2021-07-14 ENCOUNTER — Telehealth: Payer: Self-pay | Admitting: Pulmonary Disease

## 2021-07-14 NOTE — Telephone Encounter (Signed)
Lm for patient's spouse, Jeff(DPR). ?Appt can be postponed until PFT is completed if patient is not having any new or worsened sx.  ? ?

## 2021-07-14 NOTE — Telephone Encounter (Signed)
Appt canceled. ?Will close encounter as nothing further is needed.  ? ?

## 2021-07-15 ENCOUNTER — Ambulatory Visit: Payer: Medicare HMO | Admitting: Obstetrics and Gynecology

## 2021-07-15 DIAGNOSIS — G319 Degenerative disease of nervous system, unspecified: Secondary | ICD-10-CM | POA: Diagnosis not present

## 2021-07-15 DIAGNOSIS — I69319 Unspecified symptoms and signs involving cognitive functions following cerebral infarction: Secondary | ICD-10-CM | POA: Diagnosis not present

## 2021-07-15 DIAGNOSIS — L97812 Non-pressure chronic ulcer of other part of right lower leg with fat layer exposed: Secondary | ICD-10-CM | POA: Diagnosis not present

## 2021-07-15 DIAGNOSIS — Z6841 Body Mass Index (BMI) 40.0 and over, adult: Secondary | ICD-10-CM | POA: Diagnosis not present

## 2021-07-15 DIAGNOSIS — E1159 Type 2 diabetes mellitus with other circulatory complications: Secondary | ICD-10-CM | POA: Diagnosis not present

## 2021-07-15 DIAGNOSIS — G8194 Hemiplegia, unspecified affecting left nondominant side: Secondary | ICD-10-CM | POA: Diagnosis not present

## 2021-07-15 DIAGNOSIS — J449 Chronic obstructive pulmonary disease, unspecified: Secondary | ICD-10-CM | POA: Diagnosis not present

## 2021-07-15 DIAGNOSIS — I152 Hypertension secondary to endocrine disorders: Secondary | ICD-10-CM | POA: Diagnosis not present

## 2021-07-15 DIAGNOSIS — M86572 Other chronic hematogenous osteomyelitis, left ankle and foot: Secondary | ICD-10-CM | POA: Diagnosis not present

## 2021-07-15 DIAGNOSIS — I4819 Other persistent atrial fibrillation: Secondary | ICD-10-CM | POA: Diagnosis not present

## 2021-07-15 DIAGNOSIS — J9601 Acute respiratory failure with hypoxia: Secondary | ICD-10-CM | POA: Diagnosis not present

## 2021-07-15 DIAGNOSIS — I872 Venous insufficiency (chronic) (peripheral): Secondary | ICD-10-CM | POA: Diagnosis not present

## 2021-07-15 DIAGNOSIS — F339 Major depressive disorder, recurrent, unspecified: Secondary | ICD-10-CM | POA: Diagnosis not present

## 2021-07-15 DIAGNOSIS — L97522 Non-pressure chronic ulcer of other part of left foot with fat layer exposed: Secondary | ICD-10-CM | POA: Diagnosis not present

## 2021-07-15 DIAGNOSIS — E1152 Type 2 diabetes mellitus with diabetic peripheral angiopathy with gangrene: Secondary | ICD-10-CM | POA: Diagnosis not present

## 2021-07-15 DIAGNOSIS — E1142 Type 2 diabetes mellitus with diabetic polyneuropathy: Secondary | ICD-10-CM | POA: Diagnosis not present

## 2021-07-15 DIAGNOSIS — F419 Anxiety disorder, unspecified: Secondary | ICD-10-CM | POA: Diagnosis not present

## 2021-07-15 DIAGNOSIS — I69320 Aphasia following cerebral infarction: Secondary | ICD-10-CM | POA: Diagnosis not present

## 2021-07-15 DIAGNOSIS — N390 Urinary tract infection, site not specified: Secondary | ICD-10-CM | POA: Diagnosis not present

## 2021-07-15 DIAGNOSIS — E11621 Type 2 diabetes mellitus with foot ulcer: Secondary | ICD-10-CM | POA: Diagnosis not present

## 2021-07-15 DIAGNOSIS — I5033 Acute on chronic diastolic (congestive) heart failure: Secondary | ICD-10-CM | POA: Diagnosis not present

## 2021-07-15 DIAGNOSIS — L03116 Cellulitis of left lower limb: Secondary | ICD-10-CM | POA: Diagnosis not present

## 2021-07-15 DIAGNOSIS — L03115 Cellulitis of right lower limb: Secondary | ICD-10-CM | POA: Diagnosis not present

## 2021-07-15 DIAGNOSIS — Z7984 Long term (current) use of oral hypoglycemic drugs: Secondary | ICD-10-CM | POA: Diagnosis not present

## 2021-07-16 ENCOUNTER — Encounter: Payer: Self-pay | Admitting: Obstetrics and Gynecology

## 2021-07-16 ENCOUNTER — Ambulatory Visit (INDEPENDENT_AMBULATORY_CARE_PROVIDER_SITE_OTHER): Payer: Medicare HMO | Admitting: Obstetrics and Gynecology

## 2021-07-16 VITALS — BP 150/80 | HR 51

## 2021-07-16 DIAGNOSIS — N393 Stress incontinence (female) (male): Secondary | ICD-10-CM

## 2021-07-16 NOTE — Telephone Encounter (Signed)
[facility] called about plan of care orders from 4/21 have not been sent  ?

## 2021-07-16 NOTE — Progress Notes (Signed)
[facility] ? ? ?Subjective:  ?  ? ?Chief Complaint:  ?Chief Complaint  ?Patient presents with  ? Follow-up  ? ?History of Present Illness: ?[name] is a 71 y.o. female with stress incontinence and OAB who presents for a pessary check.  ? ?[name] has been working well for her. Taking furosemide in the morning. After about 3-4 hours, will take the [facility].  ? ?Using vaginal estrogen cream but not consistent with it. Noticed an odor to the urine but denies symptoms of increased frequency and dysuria.  ? ?Past Medical History: ?Patient  has a past medical history of Abnormal antibody titer, Anxiety and depression, Arthritis, Asthma, Atypical chest pain, Carotid arterial disease ([place]), Chronic heart failure with preserved ejection fraction (HFpEF) ([place]), Cystocele, Depression, Diabetes ([place]), DVT (deep venous thrombosis) ([place]), Dyspnea, Edema, GERD (gastroesophageal reflux disease), Heart murmur, History of IBS, History of kidney stones, History of methicillin resistant staphylococcus aureus (MRSA) (2008), History of multiple strokes, Hyperlipidemia, Hypertension, Hypothyroidism, Insomnia, Kidney stone, Mitral regurgitation, Neuropathy involving both lower extremities, Non-healing ulcer of left foot ([place]), Obesity, Class II, BMI 35-39.9, with comorbidity, PAF (paroxysmal atrial fibrillation) ([place]), PAH (pulmonary artery hypertension) ([place]), Peripheral vascular disease ([place]), Sleep apnea, Stroke ([place]), Tricuspid regurgitation, Urinary incontinence, and Venous stasis dermatitis of both lower extremities.  ? ?Past Surgical History: ?She  has a past surgical history that includes Ankle surgery (Right); Vaginal hysterectomy; Sleeve Gastroplasty; Lithotripsy; Kidney surgery (Right); transthoracic echocardiogram (08/03/2013); transesophageal echocardiogram (08/08/2013); Tonsillectomy; Total knee arthroplasty (Left); Hiatal hernia repair; Cholecystectomy; I & D extremity (Left, 06/27/2015); Application if  wound vac (Left, 06/27/2015); removal of left hematoma (Left); NM [facility] HX) (February 2017); Reverse shoulder arthroplasty (Right, 10/08/2015); Hernia repair; Ulnar nerve transposition (Right, 08/06/2016); arm surgery; Irrigation and debridement abscess (Left, 09/07/2016); Incision and drainage of wound (Left, 09/25/2016); Application if wound vac (Left, 09/25/2016); Colonoscopy; Upper gi endoscopy; Esophagogastroduodenoscopy (egd) with propofol (N/A, 01/11/2017); Colonoscopy with propofol (N/A, 01/11/2017); Colonoscopy with propofol (N/A, 02/22/2017); OTHER SURGICAL HISTORY; Total shoulder replacement (Right); Joint replacement (2013); Colonoscopy with propofol (N/A, 05/31/2017); Lower Extremity Angiography (Left, 04/04/2018); Esophagogastroduodenoscopy (egd) with propofol (N/A, 10/25/2018); Incision and drainage of wound (Left, 11/09/2018); and Esophagogastroduodenoscopy (egd) with propofol (N/A, 11/29/2019).  ? ?Medications: ?She has a current medication list which includes the following prescription(s): acetaminophen, alprazolam, bupropion, calcium-vitamin d, vitamin d3, clobetasol cream, colchicine, diclofenac sodium, duloxetine, eliquis, ergocalciferol, estradiol, fluconazole, trelegy ellipta, trelegy ellipta, folic acid, furosemide, hydrocodone-acetaminophen, levocetirizine, losartan, metformin, metoprolol succinate, tub transfer board, montelukast, multivitamin with minerals, mupirocin ointment, ondansetron, pantoprazole, gold bond no mess body powder, pregabalin, rosuvastatin, sitagliptin, sucralfate, thiamine, gemtesa, albuterol, benzonatate, levothyroxine, and nystatin cream.  ? ?Allergies: ?Patient is allergic to morphine and related, penicillins, ambien [zolpidem], aspirin, and oxytetracycline.  ? ?Social History: ?Patient  reports that she has never smoked. She has never used smokeless tobacco. She reports that she does not drink alcohol and does not use drugs.  ? ?  ? ?Objective:  ?  ?Physical Exam: ?BP  (!) 150/80   Pulse (!) 51  ?Gen: No apparent distress, A&O x 3. ? ?  ? ?Assessment/Plan:  ?  ?Assessment: ?Ms. Jakes is a 71 y.o. with stress incontinence and OAB, now with recurrent UTI ? ?Plan: ?- Continue with Gemtesa '75mg'$  daily.  ?- Estrace 3 times a week- does not need refill at this time.  ?- Discussed to only test for UTI if symptomatic, not necessarily for odor with lack of other symptoms.  ?- She wants to consider  physical therapy for SUI symptoms. Placed referral to [facility] rehab.  ? ?Return 4 months or sooner if needed ? ?Jaquita Folds, MD ? ?Time spent: I spent 20 minutes dedicated to the care of this patient on the date of this encounter to include pre-visit review of records, face-to-face time with the patient and post visit documentation. ? ?

## 2021-07-18 DIAGNOSIS — F339 Major depressive disorder, recurrent, unspecified: Secondary | ICD-10-CM | POA: Diagnosis not present

## 2021-07-18 DIAGNOSIS — E1152 Type 2 diabetes mellitus with diabetic peripheral angiopathy with gangrene: Secondary | ICD-10-CM | POA: Diagnosis not present

## 2021-07-18 DIAGNOSIS — L97522 Non-pressure chronic ulcer of other part of left foot with fat layer exposed: Secondary | ICD-10-CM | POA: Diagnosis not present

## 2021-07-18 DIAGNOSIS — L97812 Non-pressure chronic ulcer of other part of right lower leg with fat layer exposed: Secondary | ICD-10-CM | POA: Diagnosis not present

## 2021-07-18 DIAGNOSIS — I4819 Other persistent atrial fibrillation: Secondary | ICD-10-CM | POA: Diagnosis not present

## 2021-07-18 DIAGNOSIS — E1159 Type 2 diabetes mellitus with other circulatory complications: Secondary | ICD-10-CM | POA: Diagnosis not present

## 2021-07-18 DIAGNOSIS — L03115 Cellulitis of right lower limb: Secondary | ICD-10-CM | POA: Diagnosis not present

## 2021-07-18 DIAGNOSIS — I69320 Aphasia following cerebral infarction: Secondary | ICD-10-CM | POA: Diagnosis not present

## 2021-07-18 DIAGNOSIS — J9601 Acute respiratory failure with hypoxia: Secondary | ICD-10-CM | POA: Diagnosis not present

## 2021-07-18 DIAGNOSIS — G319 Degenerative disease of nervous system, unspecified: Secondary | ICD-10-CM | POA: Diagnosis not present

## 2021-07-18 DIAGNOSIS — E1142 Type 2 diabetes mellitus with diabetic polyneuropathy: Secondary | ICD-10-CM | POA: Diagnosis not present

## 2021-07-18 DIAGNOSIS — Z7984 Long term (current) use of oral hypoglycemic drugs: Secondary | ICD-10-CM | POA: Diagnosis not present

## 2021-07-18 DIAGNOSIS — F419 Anxiety disorder, unspecified: Secondary | ICD-10-CM | POA: Diagnosis not present

## 2021-07-18 DIAGNOSIS — E11621 Type 2 diabetes mellitus with foot ulcer: Secondary | ICD-10-CM | POA: Diagnosis not present

## 2021-07-18 DIAGNOSIS — J449 Chronic obstructive pulmonary disease, unspecified: Secondary | ICD-10-CM | POA: Diagnosis not present

## 2021-07-18 DIAGNOSIS — G8194 Hemiplegia, unspecified affecting left nondominant side: Secondary | ICD-10-CM | POA: Diagnosis not present

## 2021-07-18 DIAGNOSIS — I5033 Acute on chronic diastolic (congestive) heart failure: Secondary | ICD-10-CM | POA: Diagnosis not present

## 2021-07-18 DIAGNOSIS — I69319 Unspecified symptoms and signs involving cognitive functions following cerebral infarction: Secondary | ICD-10-CM | POA: Diagnosis not present

## 2021-07-18 DIAGNOSIS — L03116 Cellulitis of left lower limb: Secondary | ICD-10-CM | POA: Diagnosis not present

## 2021-07-18 DIAGNOSIS — I872 Venous insufficiency (chronic) (peripheral): Secondary | ICD-10-CM | POA: Diagnosis not present

## 2021-07-18 DIAGNOSIS — I152 Hypertension secondary to endocrine disorders: Secondary | ICD-10-CM | POA: Diagnosis not present

## 2021-07-18 DIAGNOSIS — N390 Urinary tract infection, site not specified: Secondary | ICD-10-CM | POA: Diagnosis not present

## 2021-07-18 DIAGNOSIS — Z6841 Body Mass Index (BMI) 40.0 and over, adult: Secondary | ICD-10-CM | POA: Diagnosis not present

## 2021-07-18 DIAGNOSIS — M86572 Other chronic hematogenous osteomyelitis, left ankle and foot: Secondary | ICD-10-CM | POA: Diagnosis not present

## 2021-07-22 DIAGNOSIS — A419 Sepsis, unspecified organism: Secondary | ICD-10-CM | POA: Diagnosis not present

## 2021-07-22 DIAGNOSIS — I69319 Unspecified symptoms and signs involving cognitive functions following cerebral infarction: Secondary | ICD-10-CM | POA: Diagnosis not present

## 2021-07-22 DIAGNOSIS — N390 Urinary tract infection, site not specified: Secondary | ICD-10-CM | POA: Diagnosis not present

## 2021-07-22 DIAGNOSIS — G319 Degenerative disease of nervous system, unspecified: Secondary | ICD-10-CM | POA: Diagnosis not present

## 2021-07-22 DIAGNOSIS — E1152 Type 2 diabetes mellitus with diabetic peripheral angiopathy with gangrene: Secondary | ICD-10-CM | POA: Diagnosis not present

## 2021-07-22 DIAGNOSIS — I872 Venous insufficiency (chronic) (peripheral): Secondary | ICD-10-CM | POA: Diagnosis not present

## 2021-07-22 DIAGNOSIS — L03115 Cellulitis of right lower limb: Secondary | ICD-10-CM | POA: Diagnosis not present

## 2021-07-22 DIAGNOSIS — I4819 Other persistent atrial fibrillation: Secondary | ICD-10-CM | POA: Diagnosis not present

## 2021-07-22 DIAGNOSIS — G8194 Hemiplegia, unspecified affecting left nondominant side: Secondary | ICD-10-CM | POA: Diagnosis not present

## 2021-07-22 DIAGNOSIS — J9601 Acute respiratory failure with hypoxia: Secondary | ICD-10-CM | POA: Diagnosis not present

## 2021-07-22 DIAGNOSIS — I69320 Aphasia following cerebral infarction: Secondary | ICD-10-CM | POA: Diagnosis not present

## 2021-07-22 DIAGNOSIS — I152 Hypertension secondary to endocrine disorders: Secondary | ICD-10-CM | POA: Diagnosis not present

## 2021-07-22 DIAGNOSIS — L03116 Cellulitis of left lower limb: Secondary | ICD-10-CM | POA: Diagnosis not present

## 2021-07-22 DIAGNOSIS — I081 Rheumatic disorders of both mitral and tricuspid valves: Secondary | ICD-10-CM | POA: Diagnosis not present

## 2021-07-22 DIAGNOSIS — L97812 Non-pressure chronic ulcer of other part of right lower leg with fat layer exposed: Secondary | ICD-10-CM | POA: Diagnosis not present

## 2021-07-22 DIAGNOSIS — M159 Polyosteoarthritis, unspecified: Secondary | ICD-10-CM | POA: Diagnosis not present

## 2021-07-22 DIAGNOSIS — L97522 Non-pressure chronic ulcer of other part of left foot with fat layer exposed: Secondary | ICD-10-CM | POA: Diagnosis not present

## 2021-07-22 DIAGNOSIS — I890 Lymphedema, not elsewhere classified: Secondary | ICD-10-CM | POA: Diagnosis not present

## 2021-07-22 DIAGNOSIS — I5033 Acute on chronic diastolic (congestive) heart failure: Secondary | ICD-10-CM | POA: Diagnosis not present

## 2021-07-22 DIAGNOSIS — J449 Chronic obstructive pulmonary disease, unspecified: Secondary | ICD-10-CM | POA: Diagnosis not present

## 2021-07-22 DIAGNOSIS — E11621 Type 2 diabetes mellitus with foot ulcer: Secondary | ICD-10-CM | POA: Diagnosis not present

## 2021-07-22 DIAGNOSIS — E875 Hyperkalemia: Secondary | ICD-10-CM | POA: Diagnosis not present

## 2021-07-22 DIAGNOSIS — I272 Pulmonary hypertension, unspecified: Secondary | ICD-10-CM | POA: Diagnosis not present

## 2021-07-22 DIAGNOSIS — E1159 Type 2 diabetes mellitus with other circulatory complications: Secondary | ICD-10-CM | POA: Diagnosis not present

## 2021-07-22 DIAGNOSIS — E1142 Type 2 diabetes mellitus with diabetic polyneuropathy: Secondary | ICD-10-CM | POA: Diagnosis not present

## 2021-07-24 ENCOUNTER — Other Ambulatory Visit: Payer: Self-pay | Admitting: Obstetrics and Gynecology

## 2021-07-24 DIAGNOSIS — L03116 Cellulitis of left lower limb: Secondary | ICD-10-CM | POA: Diagnosis not present

## 2021-07-24 DIAGNOSIS — M159 Polyosteoarthritis, unspecified: Secondary | ICD-10-CM | POA: Diagnosis not present

## 2021-07-24 DIAGNOSIS — I272 Pulmonary hypertension, unspecified: Secondary | ICD-10-CM | POA: Diagnosis not present

## 2021-07-24 DIAGNOSIS — I890 Lymphedema, not elsewhere classified: Secondary | ICD-10-CM | POA: Diagnosis not present

## 2021-07-24 DIAGNOSIS — I5033 Acute on chronic diastolic (congestive) heart failure: Secondary | ICD-10-CM | POA: Diagnosis not present

## 2021-07-24 DIAGNOSIS — L97522 Non-pressure chronic ulcer of other part of left foot with fat layer exposed: Secondary | ICD-10-CM | POA: Diagnosis not present

## 2021-07-24 DIAGNOSIS — L03115 Cellulitis of right lower limb: Secondary | ICD-10-CM | POA: Diagnosis not present

## 2021-07-24 DIAGNOSIS — N390 Urinary tract infection, site not specified: Secondary | ICD-10-CM | POA: Diagnosis not present

## 2021-07-24 DIAGNOSIS — I081 Rheumatic disorders of both mitral and tricuspid valves: Secondary | ICD-10-CM | POA: Diagnosis not present

## 2021-07-24 DIAGNOSIS — I69320 Aphasia following cerebral infarction: Secondary | ICD-10-CM | POA: Diagnosis not present

## 2021-07-24 DIAGNOSIS — L97812 Non-pressure chronic ulcer of other part of right lower leg with fat layer exposed: Secondary | ICD-10-CM | POA: Diagnosis not present

## 2021-07-24 DIAGNOSIS — G319 Degenerative disease of nervous system, unspecified: Secondary | ICD-10-CM | POA: Diagnosis not present

## 2021-07-24 DIAGNOSIS — E1142 Type 2 diabetes mellitus with diabetic polyneuropathy: Secondary | ICD-10-CM | POA: Diagnosis not present

## 2021-07-24 DIAGNOSIS — J9601 Acute respiratory failure with hypoxia: Secondary | ICD-10-CM | POA: Diagnosis not present

## 2021-07-24 DIAGNOSIS — I69319 Unspecified symptoms and signs involving cognitive functions following cerebral infarction: Secondary | ICD-10-CM | POA: Diagnosis not present

## 2021-07-24 DIAGNOSIS — I4819 Other persistent atrial fibrillation: Secondary | ICD-10-CM | POA: Diagnosis not present

## 2021-07-24 DIAGNOSIS — I152 Hypertension secondary to endocrine disorders: Secondary | ICD-10-CM | POA: Diagnosis not present

## 2021-07-24 DIAGNOSIS — J449 Chronic obstructive pulmonary disease, unspecified: Secondary | ICD-10-CM | POA: Diagnosis not present

## 2021-07-24 DIAGNOSIS — I872 Venous insufficiency (chronic) (peripheral): Secondary | ICD-10-CM | POA: Diagnosis not present

## 2021-07-24 DIAGNOSIS — E875 Hyperkalemia: Secondary | ICD-10-CM | POA: Diagnosis not present

## 2021-07-24 DIAGNOSIS — E1159 Type 2 diabetes mellitus with other circulatory complications: Secondary | ICD-10-CM | POA: Diagnosis not present

## 2021-07-24 DIAGNOSIS — E11621 Type 2 diabetes mellitus with foot ulcer: Secondary | ICD-10-CM | POA: Diagnosis not present

## 2021-07-24 DIAGNOSIS — G8194 Hemiplegia, unspecified affecting left nondominant side: Secondary | ICD-10-CM | POA: Diagnosis not present

## 2021-07-24 DIAGNOSIS — A419 Sepsis, unspecified organism: Secondary | ICD-10-CM | POA: Diagnosis not present

## 2021-07-24 DIAGNOSIS — E1152 Type 2 diabetes mellitus with diabetic peripheral angiopathy with gangrene: Secondary | ICD-10-CM | POA: Diagnosis not present

## 2021-07-27 NOTE — Progress Notes (Signed)
PROVIDER NOTE: Information contained herein reflects review and annotations entered in association with encounter. Interpretation of such information and data should be left to medically-trained personnel. Information provided to patient can be located elsewhere in the medical record under "Patient Instructions". Document created using STT-dictation technology, any transcriptional errors that may result from process are unintentional.  ?  ?Patient: [name]  Service Category: E/M  Provider: Gaspar Cola, MD  ?DOB: 08/29/1950  DOS: 07/30/2021  Specialty: Interventional Pain Management  ?MRN: [id number]  Setting: Ambulatory outpatient  PCP: [name], [name], MD  ?Type: Established Patient    Referring Provider: [name], Olivia Mackie *  ?Location: Office  Delivery: Face-to-face    ? ?HPI  ?[name], a 71 y.o. year old adult, is here today because of her Chronic pain syndrome [G89.4]. Ms. Porchia primary complain today is Peripheral Neuropathy and Shoulder Pain (left) ?Last encounter: My last encounter with her was on 05/12/2021. ?Pertinent problems: Ms. Urizar has Diabetic peripheral neuropathy associated with type 2 diabetes mellitus ([place]); Other shoulder lesions (Right); Neuropathy of lateral femoral cutaneous nerve (Right); Arthralgia; Osteoarthritis involving multiple joints; Status post reverse total shoulder replacement; Chronic pain syndrome; Chronic shoulder pain (Right); Carpal tunnel syndrome; Cervical radiculitis; Cubital tunnel syndrome (Right); Impingement syndrome of shoulder region; Pain in joint involving ankle and foot; Spinal stenosis of thoracic region; Hip joint pain (Left); Abnormality of gait and mobility; Diabetic ulcer of left foot ([place]); Muscle strain of hip (Left); Hip strain (Right); Ischial pain, right; Fibromyalgia syndrome; Chronic musculoskeletal pain; Peripheral vascular disease ([place]); Chronic ulcer of right leg ([place]); Meralgia paresthetica (Right);  Chronic pain of right ankle; Arthritis; Left ankle pain; Chronic shoulder pain (Left); Left hemiparesis ([place]); Primary osteoarthritis of left shoulder; Rotator cuff disorder, left; Ulcer of left heel and midfoot (HCC); PAD (peripheral artery disease) ([place]); Peripheral neurogenic pain; Chronic peripheral neuropathic pain; Chronic shoulder pain (Bilateral); Impaired range of motion of shoulder (Left); and Acute pain of shoulder (Left) on their pertinent problem list. ?Pain Assessment: Severity of Chronic pain is reported as a 8 /10. Location: Shoulder Left/left arm, above elbow. Onset: More than a month ago. Quality: Guarding, Radiating, Constant, Stabbing. Timing: Constant. Modifying factor(s): rest, medications, topicals. ?Vitals:  height is '5\' 6"'$  (1.676 m) and weight is 252 lb (114.3 kg). Her temporal temperature is 97.2 ?F (36.2 ?C) (abnormal). Her blood pressure is 105/44 (abnormal) and her pulse is 73. Her respiration is 18 and oxygen saturation is 95%.  ? ?Reason for encounter: medication management.  The patient indicates doing well with the current medication regimen. No adverse reactions or side effects reported to the medications.   ? ?On 05/12/2021 I provided the patient with 3 prescriptions.  According to the PMP, she has had only 2 of those prescriptions filled.  According to the PMP her last prescription was filled on 07/08/2021 to last 30 days.  If the patient was to fill her third prescription on 08/07/2021, he should then last until 09/06/2021.  This is when her first of 2 prescriptions written today can be filled. ? ?Today the patient comes in indicating that she is having more pain in the area of her left shoulder where she is planning on having a left shoulder injection done by [facility].  I reminded the patient that as part of the chronic pain management that we provide patients, and the pharmacological management, we also include the interventional therapies.  I reminded her that our rules is  usually that patients that elect  to have their procedures done at other practices, we recommended they also moved there pharmacological management to those practices.  She refers that she recently had an MRI of the left shoulder done and she has also been told by the orthopedic surgeons that they will not be doing surgery secondary to her history of family had 2 strokes, being morbidly obese, having diabetes, and in general having increased risk of perioperative complications secondary to her medical problems including her anticoagulation.  Review of the MRI shows that she has severe osteoarthritis of the left glenohumeral joint with moderate tendinosis of the supraspinatus tendon with a small partial-thickness articular surface tear.  Today she indicates that they have explained to her that they plan on doing an injection using an anterior approach and fluoroscopy.  She has asked me how I normally perform any injections and I explained to her that in this instance there are 2 possible alternatives.  The first 1 would be to do an intra-articular injection with an anterior approach such as that described to her by [facility].  The second alternative is usually left for those instances where the first injection does not provide her with any significant relief of the pain.  The second alternative usually involves blocking the suprascapular nerve as a diagnostic injection.  If that provides the patient with good relief of the pain at least for the duration of the local anesthetic, then radiofrequency ablation of the suprascapular nerve can be performed for the purpose of providing her with longer lasting benefit.  Today she has communicated to me that she has issues with anxiety and she is not crazy about doing any type of injections and therefore she was wondering about doing them with some type of sedation.  I have explained to her that we do provide some sedation but I have also explained to her that she needs to  know that we never put anybody to sleep due to the fact that this is contraindicated by the American Society of interventional pain specialist secondary to its high association with perioperative complications. ? ?In addition to the above, the patient also indicated that she still having quite a bit of pain from her diabetic peripheral neuropathy and she was wondering if I could increase her Lyrica.  I have reminded her that I have transferred this medication to her primary care physician and that from this point one, she would need to have that particular conversation with them.  According to her last comprehensive metabolic panel, she does have a decrease GFR which would limit to some extent how much Lyrica she can have.  As alternative, I have offered the patient Qutenza treatments.  I have explained to the patient what this consisted of and I have also provided her with written information regarding the procedure.  The patient indicated being interested in this option. ? ?After having spoken to the patient for a while on the above 2 subjects, she was unable to come up to a decision as to whether or not she wanted [place] to do that left shoulder injection or the Qutenza treatment.  She indicated that she would be thinking about it and that she would let us know when she makes that decision.  However, meanwhile she has asked [place] to increase her pain medication.  She claims that due to this pain, she is taking 3 pills/day.  At this point, I had come from the patient regarding this since it is not accurate.  To start with, I  have been prescribing 45 pills of hydrocodone per month for the treatment of her pain.  On 05/12/2021 I have provided the patient with 3 prescriptions for the hydrocodone and according to my evaluation of her PMP, she has not yet to fill the third prescription.  This means that she is not only not taking 3 pills/day, but they are 45 pills/month that I have provided her have been lasting longer than  the expected 30 days.  For this reason, I have denied her request to increase the dose. ? ?RTCB: 11/05/2021 ?Nonopioids transferred 02/19/2020: Lyrica and Cymbalta ? ?Pharmacotherapy Assessment  ?Analgesic: Hydroc

## 2021-07-29 DIAGNOSIS — A419 Sepsis, unspecified organism: Secondary | ICD-10-CM | POA: Diagnosis not present

## 2021-07-29 DIAGNOSIS — N390 Urinary tract infection, site not specified: Secondary | ICD-10-CM | POA: Diagnosis not present

## 2021-07-29 DIAGNOSIS — J449 Chronic obstructive pulmonary disease, unspecified: Secondary | ICD-10-CM | POA: Diagnosis not present

## 2021-07-29 DIAGNOSIS — E1152 Type 2 diabetes mellitus with diabetic peripheral angiopathy with gangrene: Secondary | ICD-10-CM | POA: Diagnosis not present

## 2021-07-29 DIAGNOSIS — I872 Venous insufficiency (chronic) (peripheral): Secondary | ICD-10-CM | POA: Diagnosis not present

## 2021-07-29 DIAGNOSIS — I5033 Acute on chronic diastolic (congestive) heart failure: Secondary | ICD-10-CM | POA: Diagnosis not present

## 2021-07-29 DIAGNOSIS — E1159 Type 2 diabetes mellitus with other circulatory complications: Secondary | ICD-10-CM | POA: Diagnosis not present

## 2021-07-29 DIAGNOSIS — L97522 Non-pressure chronic ulcer of other part of left foot with fat layer exposed: Secondary | ICD-10-CM | POA: Diagnosis not present

## 2021-07-29 DIAGNOSIS — I890 Lymphedema, not elsewhere classified: Secondary | ICD-10-CM | POA: Diagnosis not present

## 2021-07-29 DIAGNOSIS — L97812 Non-pressure chronic ulcer of other part of right lower leg with fat layer exposed: Secondary | ICD-10-CM | POA: Diagnosis not present

## 2021-07-29 DIAGNOSIS — E11621 Type 2 diabetes mellitus with foot ulcer: Secondary | ICD-10-CM | POA: Diagnosis not present

## 2021-07-29 DIAGNOSIS — I69319 Unspecified symptoms and signs involving cognitive functions following cerebral infarction: Secondary | ICD-10-CM | POA: Diagnosis not present

## 2021-07-29 DIAGNOSIS — J9601 Acute respiratory failure with hypoxia: Secondary | ICD-10-CM | POA: Diagnosis not present

## 2021-07-29 DIAGNOSIS — I152 Hypertension secondary to endocrine disorders: Secondary | ICD-10-CM | POA: Diagnosis not present

## 2021-07-29 DIAGNOSIS — E1142 Type 2 diabetes mellitus with diabetic polyneuropathy: Secondary | ICD-10-CM | POA: Diagnosis not present

## 2021-07-29 DIAGNOSIS — L03115 Cellulitis of right lower limb: Secondary | ICD-10-CM | POA: Diagnosis not present

## 2021-07-29 DIAGNOSIS — G319 Degenerative disease of nervous system, unspecified: Secondary | ICD-10-CM | POA: Diagnosis not present

## 2021-07-29 DIAGNOSIS — I69320 Aphasia following cerebral infarction: Secondary | ICD-10-CM | POA: Diagnosis not present

## 2021-07-29 DIAGNOSIS — E875 Hyperkalemia: Secondary | ICD-10-CM | POA: Diagnosis not present

## 2021-07-29 DIAGNOSIS — I081 Rheumatic disorders of both mitral and tricuspid valves: Secondary | ICD-10-CM | POA: Diagnosis not present

## 2021-07-29 DIAGNOSIS — M159 Polyosteoarthritis, unspecified: Secondary | ICD-10-CM | POA: Diagnosis not present

## 2021-07-29 DIAGNOSIS — G8194 Hemiplegia, unspecified affecting left nondominant side: Secondary | ICD-10-CM | POA: Diagnosis not present

## 2021-07-29 DIAGNOSIS — I4819 Other persistent atrial fibrillation: Secondary | ICD-10-CM | POA: Diagnosis not present

## 2021-07-29 DIAGNOSIS — I272 Pulmonary hypertension, unspecified: Secondary | ICD-10-CM | POA: Diagnosis not present

## 2021-07-29 DIAGNOSIS — L03116 Cellulitis of left lower limb: Secondary | ICD-10-CM | POA: Diagnosis not present

## 2021-07-30 ENCOUNTER — Ambulatory Visit (INDEPENDENT_AMBULATORY_CARE_PROVIDER_SITE_OTHER): Payer: Medicare HMO | Admitting: Nurse Practitioner

## 2021-07-30 ENCOUNTER — Ambulatory Visit (HOSPITAL_BASED_OUTPATIENT_CLINIC_OR_DEPARTMENT_OTHER): Payer: Medicare HMO | Admitting: Pain Medicine

## 2021-07-30 ENCOUNTER — Encounter: Payer: Self-pay | Admitting: Pain Medicine

## 2021-07-30 ENCOUNTER — Other Ambulatory Visit: Payer: Self-pay

## 2021-07-30 ENCOUNTER — Other Ambulatory Visit
Admission: RE | Admit: 2021-07-30 | Discharge: 2021-07-30 | Disposition: A | Discharge status: Home / Self Care | Payer: Medicare HMO | Attending: Nurse Practitioner | Admitting: Nurse Practitioner

## 2021-07-30 ENCOUNTER — Encounter: Payer: Self-pay | Admitting: Nurse Practitioner

## 2021-07-30 VITALS — BP 105/44 | HR 73 | Temp 97.2°F | Resp 18 | Ht 66.0 in | Wt 252.0 lb

## 2021-07-30 VITALS — BP 116/60 | HR 57 | Ht 66.0 in | Wt 252.0 lb

## 2021-07-30 DIAGNOSIS — M25511 Pain in right shoulder: Secondary | ICD-10-CM | POA: Diagnosis not present

## 2021-07-30 DIAGNOSIS — E1151 Type 2 diabetes mellitus with diabetic peripheral angiopathy without gangrene: Secondary | ICD-10-CM | POA: Diagnosis not present

## 2021-07-30 DIAGNOSIS — M159 Polyosteoarthritis, unspecified: Secondary | ICD-10-CM | POA: Insufficient documentation

## 2021-07-30 DIAGNOSIS — M25512 Pain in left shoulder: Secondary | ICD-10-CM

## 2021-07-30 DIAGNOSIS — G5711 Meralgia paresthetica, right lower limb: Secondary | ICD-10-CM

## 2021-07-30 DIAGNOSIS — G8929 Other chronic pain: Secondary | ICD-10-CM | POA: Diagnosis not present

## 2021-07-30 DIAGNOSIS — I5032 Chronic diastolic (congestive) heart failure: Secondary | ICD-10-CM | POA: Insufficient documentation

## 2021-07-30 DIAGNOSIS — I5033 Acute on chronic diastolic (congestive) heart failure: Secondary | ICD-10-CM | POA: Diagnosis not present

## 2021-07-30 DIAGNOSIS — I480 Paroxysmal atrial fibrillation: Secondary | ICD-10-CM | POA: Insufficient documentation

## 2021-07-30 DIAGNOSIS — I10 Essential (primary) hypertension: Secondary | ICD-10-CM

## 2021-07-30 DIAGNOSIS — I6930 Unspecified sequelae of cerebral infarction: Secondary | ICD-10-CM | POA: Insufficient documentation

## 2021-07-30 DIAGNOSIS — G894 Chronic pain syndrome: Secondary | ICD-10-CM | POA: Insufficient documentation

## 2021-07-30 DIAGNOSIS — Z79891 Long term (current) use of opiate analgesic: Secondary | ICD-10-CM

## 2021-07-30 DIAGNOSIS — M797 Fibromyalgia: Secondary | ICD-10-CM

## 2021-07-30 DIAGNOSIS — G8194 Hemiplegia, unspecified affecting left nondominant side: Secondary | ICD-10-CM | POA: Diagnosis not present

## 2021-07-30 DIAGNOSIS — G4733 Obstructive sleep apnea (adult) (pediatric): Secondary | ICD-10-CM | POA: Diagnosis not present

## 2021-07-30 DIAGNOSIS — Z79899 Other long term (current) drug therapy: Secondary | ICD-10-CM

## 2021-07-30 DIAGNOSIS — Z8673 Personal history of transient ischemic attack (TIA), and cerebral infarction without residual deficits: Secondary | ICD-10-CM

## 2021-07-30 LAB — BASIC METABOLIC PANEL
Anion gap: 6 (ref 5–15)
BUN: 31 mg/dL — ABNORMAL HIGH (ref 8–23)
CO2: 31 mmol/L (ref 22–32)
Calcium: 8.7 mg/dL — ABNORMAL LOW (ref 8.9–10.3)
Chloride: 95 mmol/L — ABNORMAL LOW (ref 98–111)
Creatinine, Ser: 1.26 mg/dL — ABNORMAL HIGH (ref 0.44–1.00)
GFR, Estimated: 46 mL/min — ABNORMAL LOW (ref >60)
Glucose, Bld: 313 mg/dL — ABNORMAL HIGH (ref 70–99)
Potassium: 4.3 mmol/L (ref 3.5–5.1)
Sodium: 132 mmol/L — ABNORMAL LOW (ref 135–145)

## 2021-07-30 MED ORDER — HYDROCODONE-ACETAMINOPHEN 5-325 MG PO TABS: 1.0000 | ORAL_TABLET | Freq: Two times a day (BID) | ORAL | 0 refills | Status: DC | PRN

## 2021-07-30 NOTE — Progress Notes (Signed)
? ? ?Office Visit  ?  ?Patient Name: [name] ?Date of Encounter: 07/30/2021 ? ?Primary Care Provider:  [name], [name], MD ?Primary Cardiologist:  Kathlyn Sacramento, MD ? ?Chief Complaint  ?  ?71 year old female with a history of HFpEF, paroxysmal atrial fibrillation, prior strokes, hypertension, hyperlipidemia, diabetes, CKD III, obesity, chronic pain, DVT, and sleep apnea on CPAP, who presents for follow-up of HFpEF. ? ?Past Medical History  ?  ?Past Medical History:  ?Diagnosis Date  ? Abnormal antibody titer   ? Anxiety and depression   ? Arthritis   ? Asthma   ? Atypical chest pain   ? a. 03/2018 MV: No ischemia/infarct. EF >65%.  ? Carotid arterial disease ([place])   ? a. 08/7589 U/S: RICA <63, LICA nl.  ? Chronic heart failure with preserved ejection fraction (HFpEF) ([place])   ? a. 08/2013 Echo: EF 60-65%; b. 08/2019 Echo: EF 60-65%; c. 03/2020 Echo: EF 60-65%; d. 05/2021 Echo: EF 55-60%, no rmwa, nl RV fxn, RVSP 54.62mHg. Mildly dil LA, mild MR, mod TR.  ? CKD (chronic kidney disease), stage III ([place]   ? Cystocele   ? Depression   ? Diabetes ([place]   ? DVT (deep venous thrombosis) ([place]   ? Righ calf  ? Dyspnea   ? 11/08/2018 - "more now due to lack of exercise"  ? Edema   ? GERD (gastroesophageal reflux disease)   ? Heart murmur   ? History of IBS   ? History of kidney stones   ? History of methicillin resistant staphylococcus aureus (MRSA) 2008  ? History of multiple strokes   ? Hyperlipidemia   ? Hypertension   ? Hypothyroidism   ? Insomnia   ? Kidney stone   ? kidney stones with lithotripsy .  CSuperiorKidney- "adrenal glands"  ? Mitral regurgitation   ? a. 05/2021 Echo: Mild MR.  ? Neuropathy involving both lower extremities   ? Non-healing ulcer of left foot (HRio Bravo   ? Obesity, Class II, BMI 35-39.9, with comorbidity   ? PAF (paroxysmal atrial fibrillation) ([place]   ? a. 07/2013 Event monitor: Freq PVCs and 3 short runs of Afib-->CHA2DS2VASc = 7-->Eliquis.  ? PAH (pulmonary artery hypertension) ([place]   ?  a. 05/2021 Echo: RVSP 54.497mg.  ? Peripheral vascular disease ([place]  ? a. 03/2018 LE Angio: Nl renal arteries. Nl aorta & iliacs. Nl LLE arterial flow; b. 06/2020 ABI's: Nl bilat.  ? Sleep apnea   ? use C-PAP  ? [facility] ([facility]  ? 03/2020  ? Tricuspid regurgitation   ? a. 05/2021 Echo: Mod TR (in setting of RVSP 54.51m86m).  ? Urinary incontinence   ? Venous stasis dermatitis of both lower extremities   ? ?Past Surgical History:  ?Procedure Laterality Date  ? ANKLE SURGERY Right   ? APPLICATION OF WOUND VAC Left 06/27/2015  ? Procedure: APPLICATION OF WOUND VAC ( POSSIBLE ) ;  Surgeon: JasAlgernon HuxleyD;  Location: [facility] ORS;  Service: Vascular;  Laterality: Left;  ? APPLICATION OF WOUND VAC Left 09/25/2016  ? Procedure: APPLICATION OF WOUND VAC;  Surgeon: TroAlbertine PatriciaPM;  Location: [facility] ORS;  Service: Podiatry;  Laterality: Left;  ? arm surgery    ? right    ? CHOLECYSTECTOMY    ? COLONOSCOPY    ? COLONOSCOPY WITH PROPOFOL N/A 01/11/2017  ? Procedure: COLONOSCOPY WITH PROPOFOL;  Surgeon: SkuLollie SailsD;  [facility];  Service: Endoscopy;  Laterality: N/A;  ? COLONOSCOPY  WITH PROPOFOL N/A 02/22/2017  ? Procedure: COLONOSCOPY WITH PROPOFOL;  Surgeon: [name], MD;  Location: [facility] ENDOSCOPY;  Service: Endoscopy;  Laterality: N/A;  ? COLONOSCOPY WITH PROPOFOL N/A 05/31/2017  ? Procedure: COLONOSCOPY WITH PROPOFOL;  Surgeon: [name], MD;  Location[facility] ENDOSCOPY;  Service: Endoscopy;  Laterality: N/A;  ? ESOPHAGOGASTRODUODENOSCOPY (EGD) WITH PROPOFOL N/A 01/11/2017  ? Procedure: ESOPHAGOGASTRODUODENOSCOPY (EGD) WITH PROPOFOL;  Surgeon: [name], MD;  Location: [facility] ENDOSCOPY;  Service: Endoscopy;  Laterality: N/A;  ? ESOPHAGOGASTRODUODENOSCOPY (EGD) WITH PROPOFOL N/A 10/25/2018  ? Procedure: ESOPHAGOGASTRODUODENOSCOPY (EGD) WITH PROPOFOL;  Surgeon: [name], MD;  Location[facility] ENDOSCOPY;  Service: Endoscopy;  Laterality: N/A;  ? ESOPHAGOGASTRODUODENOSCOPY (EGD) WITH  PROPOFOL N/A 11/29/2019  ? Procedure: ESOPHAGOGASTRODUODENOSCOPY (EGD) WITH PROPOFOL;  Surgeon: Toledo, Benay Pike, MD;  Location: [facility] ENDOSCOPY;  Service: Gastroenterology;  Laterality: N/A;  ? HERNIA REPAIR    ? umbilical  ? HIATAL HERNIA REPAIR    ? I & D EXTREMITY Left 06/27/2015  ? Procedure: IRRIGATION AND DEBRIDEMENT EXTREMITY            ( CALF HEMATOMA ) POSSIBLE WOUND VAC;  Surgeon: [name], MD;  Location: [facility] ORS;  Service: Vascular;  Laterality: Left;  ? INCISION AND DRAINAGE OF WOUND Left 09/25/2016  ? Procedure: IRRIGATION AND DEBRIDEMENT - PARTIAL RESECTION OF ACHILLES TENDON WITH WOUND VAC APPLICATION;  Surgeon: [name], DPM;  Location: [facility] ORS;  Service: Podiatry;  Laterality: Left;  ? INCISION AND DRAINAGE OF WOUND Left 11/09/2018  ? Procedure: Excision of left foot wound with ACell placement;  Surgeon: Wallace Going, DO;  Location: [place];  Service: Plastics;  Laterality: Left;  ? IRRIGATION AND DEBRIDEMENT ABSCESS Left 09/07/2016  ? Procedure: IRRIGATION AND DEBRIDEMENT ABSCESS LEFT HEEL;  Surgeon: [name], DPM;  Location: [facility] ORS;  Service: Podiatry;  Laterality: Left;  ? JOINT REPLACEMENT  2013  ? left knee replacement  ? KIDNEY SURGERY Right   ? kidney stones  ? LITHOTRIPSY    ? LOWER EXTREMITY ANGIOGRAPHY Left 04/04/2018  ? Procedure: LOWER EXTREMITY ANGIOGRAPHY;  Surgeon: [name], MD;  Location: [place] CV LAB;  Service: Cardiovascular;  Laterality: Left;  ? NM [facility] HX)  February 2017  ? Likely breast attenuation. LOW RISK study. Normal EF 55-60%.  ? OTHER SURGICAL HISTORY    ? 08/2016 or 09/2016 surgery on achilles tenso h/o staph infection heal. Dr. [name]  ? removal of left hematoma Left   ? leg  ? REVERSE SHOULDER ARTHROPLASTY Right 10/08/2015  ? Procedure: REVERSE SHOULDER ARTHROPLASTY;  Surgeon: [name], MD;  Location: [facility] ORS;  Service: Orthopedics;  Laterality: Right;  ? SLEEVE GASTROPLASTY    ? TONSILLECTOMY    ? TOTAL KNEE  ARTHROPLASTY Left   ? TOTAL SHOULDER REPLACEMENT Right   ? TRANSESOPHAGEAL ECHOCARDIOGRAM  08/08/2013  ? Mild LVH, EF 60-65%. Moderate LA dilation and mild RA dilation. Mild MR with no evidence of stenosis and no evidence of endocarditis. A false chordae is noted.  ? TRANSTHORACIC ECHOCARDIOGRAM  08/03/2013  ? Mild-Moderate concentric LVH, EF 60-65%. Normal diastolic function. Mild LA dilation. Mild MR with possible vegetation  - not confirmed on TEE   ? ULNAR NERVE TRANSPOSITION Right 08/06/2016  ? Procedure: ULNAR NERVE DECOMPRESSION/TRANSPOSITION;  Surgeon: [name], MD;  Location: [facility] ORS;  Service: Orthopedics;  Laterality: Right;  ? UPPER GI ENDOSCOPY    ? VAGINAL HYSTERECTOMY    ? ? ?  Allergies ? ?Allergies  ?Allergen Reactions  ? Morphine And Related Other (See Comments) and Nausea Only  ?  Patient becomes very confused ?Other reaction(s): Other (See Comments), Other (See Comments) ?Patient becomes very confused ?Patient becomes very confused ?  ? Penicillins Hives, Shortness Of Breath, Swelling and Other (See Comments)  ?  Facial swelling ?Has patient had a PCN reaction causing immediate rash, facial/tongue/throat swelling, SOB or lightheadedness with hypotension: Yes ?Has patient had a PCN reaction causing severe rash involving mucus membranes or skin necrosis: Yes ?Has patient had a PCN reaction that required hospitalization No ?Has patient had a PCN reaction occurring within the last 10 years: No ?If all of the above answers are "NO", then may proceed with Cephalosporin use. ? ?Other reaction(s): Other (See Comments), Other (See Comments) ?Facial swelling ?Has patient had a PCN reaction causing immediate rash, facial/tongue/throat swelling, SOB or lightheadedness with hypotension: Yes ?Has patient had a PCN reaction causing severe rash involving mucus membranes or skin necrosis: Yes ?Has patient had a PCN reaction that required hospitalization No ?Has patient had a PCN reaction occurring within the  last 10 years: No ?If all of the above answers are "NO", then may proceed with Cephalosporin use. ?Facial swelling ?Has patient had a PCN reaction causing immediate rash, facial/tongue/throat swelling, SOB o

## 2021-07-30 NOTE — Progress Notes (Signed)

## 2021-07-30 NOTE — Patient Instructions (Addendum)
____________________________________________________________________________________________ ? ?Pharmacy Shortages of Pain Medication  ? ?Introduction ?Shockingly as it may seem, ? ? ??No U.S[place] Supreme Court decision has ever interpreted the Constitution as guaranteeing a right to health care for all Americans.? ?- https://www.healthequityandpolicylab.com/elusive-right-to-health-care-under-us-law ? ??With respect to human rights, the [place] has no formally codified right to health, nor does it participate in a human rights treaty that specifies a right to health.? ?- Scott J. Schweikart, JD, MBE ? ?Situation ?By now, most of our patients have had the experience of being told by their pharmacist that they do not have enough medication to cover their prescription. If you have not had this experience, just know that you soon will. ? ?Problem ?There appears to be a shortage of these medications, either at the national level or locally. This is happening with all pharmacies. When there is not enough medication, patients are offered a partial fill and they are told that they will try to get the rest of the medicine for them at a later time. If they do not have enough for even a partial fill, the pharmacists are telling the patients to call us (the prescribing physicians) to request that we send another prescription to another pharmacy to get the medicine.  ? ?This reordering of a controlled substance creates documentation problems where additional paperwork needs to be created to explain why two prescriptions for the same period of time and the same medicine are being prescribed to the same patient. It also creates situations where the last appointment note does not accurately reflect when and what prescriptions were given to a patient. This leads to prescribing errors down the line, in subsequent follow-up visits.  ? ?[place] Board of Pharmacy ([place]BOP) ?Research revealed that Board of Pharmacy Rule .1806 (21  NCAC 46.1806) authorizes pharmacists to the transfer of prescriptions among pharmacies, and it sets forth procedural and recordkeeping requirements for doing so. However, this requires the pharmacist to complete the previously mentioned procedural paperwork to accomplish the transfer. As it turns out, it is much easier for them to have the prescribing physicians do the work.  ? ?Possible solutions ?1. Have the [place] Assembly add a provision to the "STOP ACT" (the law that mandates how controlled substances are prescribed) where there is an exception to the electronic prescribing rule that states that in the event there are shortages of medications the physicians are allowed to use written prescriptions as opposed to electronic ones. This would allow patients to take their prescriptions to a different pharmacy that may have enough medication available to fill the prescription. The problem is that currently there is a law that does not allow for written prescriptions, with the exception of instances where the electronic medical record is down due to technical issues.  ?2. Have [place] Congress ease the pressure on pharmaceutical companies, allowing them to produce enough quantities of the medication to adequately supply the population. ?3. Have pharmacies keep enough stocks of these medications to cover their client base.  ?4. Have the [place] Assembly add a provision to the "STOP ACT" where they ease the regulations surrounding the transfer of controlled substances between pharmacies, so as to simplify the transfer of supplies. As an alternative, develop a system to allow patients to obtain the remainder of their prescription at another one of their pharmacies or at an associate pharmacy.  ? ?How this shortage will affect you.  ?The one thing that is abundantly clear is that this is a pharmacy supply   problem  and not a prescriber problem. The job of the prescriber is to evaluate and monitor the  patients for the appropriate indications to the use of these medicines, the monitoring of their use and the prescribing of the appropriate dose and regimen. It is not the job of the prescriber to provide or dispense the actual medication. By law, this is the job of the pharmacies and pharmacists. It is certainly not the job of the prescriber to solve the supply problems.  ? ?Due to the above problems we are no longer taking patients to write for their pain medication. We will continue to evaluate for appropriate indications and we may provide recommendations regarding medication, dose, and schedule, as well as monitoring recommendations, however, we will not be taking over the actual prescribing of these substances. On those patients where we are treating their chronic pain with interventional therapies, exceptions will be considered on a case by case basis. At this time, we will try to continue providing this supplemental service to those patients we have been managing in the past. However, as of August 1st, 2023, we no longer will be sending additional prescriptions to other pharmacies for the purpose of solving their supply problems. Once we send a prescription to a pharmacy, we will not be resending it again to another pharmacy to cover for their shortages.  ? ?What to do. ?Write as many letters as you can. Recruit the help of family members in writing these letters. Below are some of the places where you can write to make your voice heard. Let them know what the problem is and push them to look for solutions.  ? ?Search internet for: ?[place] find your legislators? ?https://www.ncleg.gov/findyourlegislators ? ?Search internet for: ?[place] insurance commissioner complaints? ?https://www.ncdoi.gov/contactscomplaints/assistance-or-file-complaint ? ?Search internet for: ?[place] Board of Pharmacy complaints? ?http://www.ncbop.org/contact.htm ? ?Search internet for: ?[facility] pharmacy  complaints? ?Email [facility] Customer Relations ?https://www.[facility]vs.com/help/email-customer-relations.jsp?callType=store ? ?Search internet for: ?[facility] pharmacy customer service complaints? ?https://www.[facility].com/topic/marketing/contactus/contactus_customerservice.jsp ? ?____________________________________________________________________________________________ ? ____________________________________________________________________________________________ ? ?Medication Rules ? ?Purpose: To inform patients, and their family members, of our rules and regulations. ? ?Applies to: All patients receiving prescriptions (written or electronic). ? ?Pharmacy of record: Pharmacy where electronic prescriptions will be sent. If written prescriptions are taken to a different pharmacy, please inform the nursing staff. The pharmacy listed in the electronic medical record should be the one where you would like electronic prescriptions to be sent. ? ?Electronic prescriptions: In compliance with the  Strengthen Opioid Misuse Prevention (STOP) Act of 2017 (Session Law 2017-74/H243), effective March 23, 2018, all controlled substances must be electronically prescribed. Calling prescriptions to the pharmacy will cease to exist. ? ?Prescription refills: Only during scheduled appointments. Applies to all prescriptions. ? ?NOTE: The following applies primarily to controlled substances (Opioid* Pain Medications).  ? ?Type of encounter (visit): For patients receiving controlled substances, face-to-face visits are required. (Not an option or up to the patient.) ? ?Patient's responsibilities: ?Pain Pills: Bring all pain pills to every appointment (except for procedure appointments). ?Pill Bottles: Bring pills in original pharmacy bottle. Always bring the newest bottle. Bring bottle, even if empty. ?Medication refills: You are responsible for knowing and keeping track of what medications you take and those you need  refilled. ?The day before your appointment: write a list of all prescriptions that need to be refilled. ?The day of the appointment: give the list to the admitting nurse. Prescriptions will be written only during appoint

## 2021-07-30 NOTE — Patient Instructions (Signed)
Medication Instructions:  ?No changes at this time.  ? ?*If you need a refill on your cardiac medications before your next appointment, please call your pharmacy* ? ? ?Lab Work: ?BMET today over at the [facility]. Check in at registration.  ? ?If you have labs (blood work) drawn today and your tests are completely normal, you will receive your results only by: ?MyChart Message (if you have MyChart) OR ?A paper copy in the mail ?If you have any lab test that is abnormal or we need to change your treatment, we will call you to review the results. ? ? ?Testing/Procedures: ?None ? ? ?Follow-Up: ?At [facility], you and your health needs are our priority.  As part of our continuing mission to provide you with exceptional heart care, we have created designated Provider Care Teams.  These Care Teams include your primary Cardiologist (physician) and Advanced Practice Providers (APPs -  Physician Assistants and Nurse Practitioners) who all work together to provide you with the care you need, when you need it. ? ? ? ?Your next appointment:   ?3 month(s) ? ?The format for your next appointment:   ?In Person ? ?Provider:   ?Kathlyn Sacramento, MD or Murray Hodgkins, NP  ? ? ? ? ?Important Information About Sugar ? ? ? ? ?  ?

## 2021-07-30 NOTE — Progress Notes (Deleted)
Nursing Pain Medication Assessment:  ?Safety precautions to be maintained throughout the outpatient stay will include: orient to surroundings, keep bed in low position, maintain call bell within reach at all times, provide assistance with transfer out of bed and ambulation.  ?Medication Inspection Compliance: Pill count conducted under aseptic conditions, in front of the patient. Neither the pills nor the bottle was removed from the patient's sight at any time. Once count was completed pills were immediately returned to the patient in their original bottle. ? ?Medication: Hydrocodone/APAP ?Pill/Patch Count:  0 of 45 pills remain ?Pill/Patch Appearance: Markings consistent with prescribed medication ?Bottle Appearance: Standard pharmacy container. Clearly labeled. ?Filled Date: 04 / 18 / 2023 ?Last Medication intake:  Today ?

## 2021-07-31 ENCOUNTER — Telehealth: Payer: Self-pay | Admitting: *Deleted

## 2021-07-31 ENCOUNTER — Telehealth: Payer: Self-pay

## 2021-07-31 DIAGNOSIS — L97522 Non-pressure chronic ulcer of other part of left foot with fat layer exposed: Secondary | ICD-10-CM | POA: Diagnosis not present

## 2021-07-31 DIAGNOSIS — I69319 Unspecified symptoms and signs involving cognitive functions following cerebral infarction: Secondary | ICD-10-CM | POA: Diagnosis not present

## 2021-07-31 DIAGNOSIS — I4819 Other persistent atrial fibrillation: Secondary | ICD-10-CM | POA: Diagnosis not present

## 2021-07-31 DIAGNOSIS — E11621 Type 2 diabetes mellitus with foot ulcer: Secondary | ICD-10-CM | POA: Diagnosis not present

## 2021-07-31 DIAGNOSIS — G8194 Hemiplegia, unspecified affecting left nondominant side: Secondary | ICD-10-CM | POA: Diagnosis not present

## 2021-07-31 DIAGNOSIS — L03116 Cellulitis of left lower limb: Secondary | ICD-10-CM | POA: Diagnosis not present

## 2021-07-31 DIAGNOSIS — N390 Urinary tract infection, site not specified: Secondary | ICD-10-CM | POA: Diagnosis not present

## 2021-07-31 DIAGNOSIS — G319 Degenerative disease of nervous system, unspecified: Secondary | ICD-10-CM | POA: Diagnosis not present

## 2021-07-31 DIAGNOSIS — J9601 Acute respiratory failure with hypoxia: Secondary | ICD-10-CM | POA: Diagnosis not present

## 2021-07-31 DIAGNOSIS — L97812 Non-pressure chronic ulcer of other part of right lower leg with fat layer exposed: Secondary | ICD-10-CM | POA: Diagnosis not present

## 2021-07-31 DIAGNOSIS — I152 Hypertension secondary to endocrine disorders: Secondary | ICD-10-CM | POA: Diagnosis not present

## 2021-07-31 DIAGNOSIS — J449 Chronic obstructive pulmonary disease, unspecified: Secondary | ICD-10-CM | POA: Diagnosis not present

## 2021-07-31 DIAGNOSIS — E1152 Type 2 diabetes mellitus with diabetic peripheral angiopathy with gangrene: Secondary | ICD-10-CM | POA: Diagnosis not present

## 2021-07-31 DIAGNOSIS — M159 Polyosteoarthritis, unspecified: Secondary | ICD-10-CM | POA: Diagnosis not present

## 2021-07-31 DIAGNOSIS — I5033 Acute on chronic diastolic (congestive) heart failure: Secondary | ICD-10-CM | POA: Diagnosis not present

## 2021-07-31 DIAGNOSIS — E875 Hyperkalemia: Secondary | ICD-10-CM | POA: Diagnosis not present

## 2021-07-31 DIAGNOSIS — A419 Sepsis, unspecified organism: Secondary | ICD-10-CM | POA: Diagnosis not present

## 2021-07-31 DIAGNOSIS — I890 Lymphedema, not elsewhere classified: Secondary | ICD-10-CM | POA: Diagnosis not present

## 2021-07-31 DIAGNOSIS — E1159 Type 2 diabetes mellitus with other circulatory complications: Secondary | ICD-10-CM | POA: Diagnosis not present

## 2021-07-31 DIAGNOSIS — I69320 Aphasia following cerebral infarction: Secondary | ICD-10-CM | POA: Diagnosis not present

## 2021-07-31 DIAGNOSIS — I272 Pulmonary hypertension, unspecified: Secondary | ICD-10-CM | POA: Diagnosis not present

## 2021-07-31 DIAGNOSIS — I081 Rheumatic disorders of both mitral and tricuspid valves: Secondary | ICD-10-CM | POA: Diagnosis not present

## 2021-07-31 DIAGNOSIS — E1142 Type 2 diabetes mellitus with diabetic polyneuropathy: Secondary | ICD-10-CM | POA: Diagnosis not present

## 2021-07-31 DIAGNOSIS — I872 Venous insufficiency (chronic) (peripheral): Secondary | ICD-10-CM | POA: Diagnosis not present

## 2021-07-31 DIAGNOSIS — L03115 Cellulitis of right lower limb: Secondary | ICD-10-CM | POA: Diagnosis not present

## 2021-07-31 MED ORDER — FUROSEMIDE 20 MG PO TABS: 20.0000 mg | ORAL_TABLET | ORAL | 3 refills | Status: DC

## 2021-07-31 NOTE — Telephone Encounter (Signed)
Reviewed results and recommendations with patient. She repeated back instructions to take Furosemide 20 mg once daily with additional tablet as needed for weight gain. She verbalized understanding of our conversation, agreement with plan, and had no further questions at this time.  ?

## 2021-07-31 NOTE — Telephone Encounter (Signed)
Left voicemail message to call back for review of results and recommendations.  

## 2021-07-31 NOTE — Telephone Encounter (Signed)
S/w pt and informed her that we received fax letter from [place] stating that the FDA identified a recall on Pantoprazole Sodium 40 mg Delayed Released Tablets, due to "all pills looking discolored and they are unsure if they are dirty, they are all a tan brown with white spots." Pt stated that her pills look normal in color w/o discoloration or odd color, looks completely the same as has she's been getting them. Informed her to contact pharmacy just in case to ensure pills that were dispensed had not been recalled. Pt verbalized understanding.  ?

## 2021-07-31 NOTE — Telephone Encounter (Signed)
-----   Message from Theora Gianotti, NP sent at 07/30/2021  5:19 PM EDT ----- ?Sodium is mildly low.  BUN/creatinine (kidney function) is mildly abnormal.  I think it is reasonable to reduce her daily Lasix dose to 20 mg.  She should take an additional 20 mg daily as needed for weight gain of greater than 2 pounds in 24 hours or 5 pounds over the course of the week. ?

## 2021-07-31 NOTE — Telephone Encounter (Signed)
Patient is returning call to discuss lab results. RN unavailable for transfer. Informed patient her call will be returned. ?

## 2021-08-01 DIAGNOSIS — I872 Venous insufficiency (chronic) (peripheral): Secondary | ICD-10-CM | POA: Diagnosis not present

## 2021-08-01 DIAGNOSIS — A419 Sepsis, unspecified organism: Secondary | ICD-10-CM | POA: Diagnosis not present

## 2021-08-01 DIAGNOSIS — G8194 Hemiplegia, unspecified affecting left nondominant side: Secondary | ICD-10-CM | POA: Diagnosis not present

## 2021-08-01 DIAGNOSIS — I69320 Aphasia following cerebral infarction: Secondary | ICD-10-CM | POA: Diagnosis not present

## 2021-08-01 DIAGNOSIS — L97812 Non-pressure chronic ulcer of other part of right lower leg with fat layer exposed: Secondary | ICD-10-CM | POA: Diagnosis not present

## 2021-08-01 DIAGNOSIS — G319 Degenerative disease of nervous system, unspecified: Secondary | ICD-10-CM | POA: Diagnosis not present

## 2021-08-01 DIAGNOSIS — J9601 Acute respiratory failure with hypoxia: Secondary | ICD-10-CM | POA: Diagnosis not present

## 2021-08-01 DIAGNOSIS — I69319 Unspecified symptoms and signs involving cognitive functions following cerebral infarction: Secondary | ICD-10-CM | POA: Diagnosis not present

## 2021-08-01 DIAGNOSIS — J449 Chronic obstructive pulmonary disease, unspecified: Secondary | ICD-10-CM | POA: Diagnosis not present

## 2021-08-01 DIAGNOSIS — N390 Urinary tract infection, site not specified: Secondary | ICD-10-CM | POA: Diagnosis not present

## 2021-08-01 DIAGNOSIS — E1142 Type 2 diabetes mellitus with diabetic polyneuropathy: Secondary | ICD-10-CM | POA: Diagnosis not present

## 2021-08-01 DIAGNOSIS — E1152 Type 2 diabetes mellitus with diabetic peripheral angiopathy with gangrene: Secondary | ICD-10-CM | POA: Diagnosis not present

## 2021-08-01 DIAGNOSIS — I890 Lymphedema, not elsewhere classified: Secondary | ICD-10-CM | POA: Diagnosis not present

## 2021-08-01 DIAGNOSIS — I272 Pulmonary hypertension, unspecified: Secondary | ICD-10-CM | POA: Diagnosis not present

## 2021-08-01 DIAGNOSIS — E875 Hyperkalemia: Secondary | ICD-10-CM | POA: Diagnosis not present

## 2021-08-01 DIAGNOSIS — L97522 Non-pressure chronic ulcer of other part of left foot with fat layer exposed: Secondary | ICD-10-CM | POA: Diagnosis not present

## 2021-08-01 DIAGNOSIS — I5033 Acute on chronic diastolic (congestive) heart failure: Secondary | ICD-10-CM | POA: Diagnosis not present

## 2021-08-01 DIAGNOSIS — I4819 Other persistent atrial fibrillation: Secondary | ICD-10-CM | POA: Diagnosis not present

## 2021-08-01 DIAGNOSIS — I081 Rheumatic disorders of both mitral and tricuspid valves: Secondary | ICD-10-CM | POA: Diagnosis not present

## 2021-08-01 DIAGNOSIS — E11621 Type 2 diabetes mellitus with foot ulcer: Secondary | ICD-10-CM | POA: Diagnosis not present

## 2021-08-01 DIAGNOSIS — I152 Hypertension secondary to endocrine disorders: Secondary | ICD-10-CM | POA: Diagnosis not present

## 2021-08-01 DIAGNOSIS — E1159 Type 2 diabetes mellitus with other circulatory complications: Secondary | ICD-10-CM | POA: Diagnosis not present

## 2021-08-01 DIAGNOSIS — M159 Polyosteoarthritis, unspecified: Secondary | ICD-10-CM | POA: Diagnosis not present

## 2021-08-01 DIAGNOSIS — L03115 Cellulitis of right lower limb: Secondary | ICD-10-CM | POA: Diagnosis not present

## 2021-08-01 DIAGNOSIS — L03116 Cellulitis of left lower limb: Secondary | ICD-10-CM | POA: Diagnosis not present

## 2021-08-05 DIAGNOSIS — M159 Polyosteoarthritis, unspecified: Secondary | ICD-10-CM | POA: Diagnosis not present

## 2021-08-05 DIAGNOSIS — J9601 Acute respiratory failure with hypoxia: Secondary | ICD-10-CM | POA: Diagnosis not present

## 2021-08-05 DIAGNOSIS — E11621 Type 2 diabetes mellitus with foot ulcer: Secondary | ICD-10-CM | POA: Diagnosis not present

## 2021-08-05 DIAGNOSIS — I152 Hypertension secondary to endocrine disorders: Secondary | ICD-10-CM | POA: Diagnosis not present

## 2021-08-05 DIAGNOSIS — I872 Venous insufficiency (chronic) (peripheral): Secondary | ICD-10-CM | POA: Diagnosis not present

## 2021-08-05 DIAGNOSIS — L97522 Non-pressure chronic ulcer of other part of left foot with fat layer exposed: Secondary | ICD-10-CM | POA: Diagnosis not present

## 2021-08-05 DIAGNOSIS — N390 Urinary tract infection, site not specified: Secondary | ICD-10-CM | POA: Diagnosis not present

## 2021-08-05 DIAGNOSIS — A419 Sepsis, unspecified organism: Secondary | ICD-10-CM | POA: Diagnosis not present

## 2021-08-05 DIAGNOSIS — J449 Chronic obstructive pulmonary disease, unspecified: Secondary | ICD-10-CM | POA: Diagnosis not present

## 2021-08-05 DIAGNOSIS — I4819 Other persistent atrial fibrillation: Secondary | ICD-10-CM | POA: Diagnosis not present

## 2021-08-05 DIAGNOSIS — I081 Rheumatic disorders of both mitral and tricuspid valves: Secondary | ICD-10-CM | POA: Diagnosis not present

## 2021-08-05 DIAGNOSIS — G8194 Hemiplegia, unspecified affecting left nondominant side: Secondary | ICD-10-CM | POA: Diagnosis not present

## 2021-08-05 DIAGNOSIS — L03116 Cellulitis of left lower limb: Secondary | ICD-10-CM | POA: Diagnosis not present

## 2021-08-05 DIAGNOSIS — E1159 Type 2 diabetes mellitus with other circulatory complications: Secondary | ICD-10-CM | POA: Diagnosis not present

## 2021-08-05 DIAGNOSIS — L97812 Non-pressure chronic ulcer of other part of right lower leg with fat layer exposed: Secondary | ICD-10-CM | POA: Diagnosis not present

## 2021-08-05 DIAGNOSIS — I890 Lymphedema, not elsewhere classified: Secondary | ICD-10-CM | POA: Diagnosis not present

## 2021-08-05 DIAGNOSIS — E875 Hyperkalemia: Secondary | ICD-10-CM | POA: Diagnosis not present

## 2021-08-05 DIAGNOSIS — I5033 Acute on chronic diastolic (congestive) heart failure: Secondary | ICD-10-CM | POA: Diagnosis not present

## 2021-08-05 DIAGNOSIS — E1142 Type 2 diabetes mellitus with diabetic polyneuropathy: Secondary | ICD-10-CM | POA: Diagnosis not present

## 2021-08-05 DIAGNOSIS — I69319 Unspecified symptoms and signs involving cognitive functions following cerebral infarction: Secondary | ICD-10-CM | POA: Diagnosis not present

## 2021-08-05 DIAGNOSIS — I69320 Aphasia following cerebral infarction: Secondary | ICD-10-CM | POA: Diagnosis not present

## 2021-08-05 DIAGNOSIS — G319 Degenerative disease of nervous system, unspecified: Secondary | ICD-10-CM | POA: Diagnosis not present

## 2021-08-05 DIAGNOSIS — E1152 Type 2 diabetes mellitus with diabetic peripheral angiopathy with gangrene: Secondary | ICD-10-CM | POA: Diagnosis not present

## 2021-08-05 DIAGNOSIS — L03115 Cellulitis of right lower limb: Secondary | ICD-10-CM | POA: Diagnosis not present

## 2021-08-05 DIAGNOSIS — I272 Pulmonary hypertension, unspecified: Secondary | ICD-10-CM | POA: Diagnosis not present

## 2021-08-06 DIAGNOSIS — I152 Hypertension secondary to endocrine disorders: Secondary | ICD-10-CM | POA: Diagnosis not present

## 2021-08-06 DIAGNOSIS — I872 Venous insufficiency (chronic) (peripheral): Secondary | ICD-10-CM | POA: Diagnosis not present

## 2021-08-06 DIAGNOSIS — I5033 Acute on chronic diastolic (congestive) heart failure: Secondary | ICD-10-CM | POA: Diagnosis not present

## 2021-08-06 DIAGNOSIS — E1152 Type 2 diabetes mellitus with diabetic peripheral angiopathy with gangrene: Secondary | ICD-10-CM | POA: Diagnosis not present

## 2021-08-06 DIAGNOSIS — J9601 Acute respiratory failure with hypoxia: Secondary | ICD-10-CM | POA: Diagnosis not present

## 2021-08-06 DIAGNOSIS — N390 Urinary tract infection, site not specified: Secondary | ICD-10-CM | POA: Diagnosis not present

## 2021-08-06 DIAGNOSIS — I4819 Other persistent atrial fibrillation: Secondary | ICD-10-CM | POA: Diagnosis not present

## 2021-08-06 DIAGNOSIS — L03115 Cellulitis of right lower limb: Secondary | ICD-10-CM | POA: Diagnosis not present

## 2021-08-06 DIAGNOSIS — I69320 Aphasia following cerebral infarction: Secondary | ICD-10-CM | POA: Diagnosis not present

## 2021-08-06 DIAGNOSIS — A419 Sepsis, unspecified organism: Secondary | ICD-10-CM | POA: Diagnosis not present

## 2021-08-06 DIAGNOSIS — G319 Degenerative disease of nervous system, unspecified: Secondary | ICD-10-CM | POA: Diagnosis not present

## 2021-08-06 DIAGNOSIS — I890 Lymphedema, not elsewhere classified: Secondary | ICD-10-CM | POA: Diagnosis not present

## 2021-08-06 DIAGNOSIS — L97522 Non-pressure chronic ulcer of other part of left foot with fat layer exposed: Secondary | ICD-10-CM | POA: Diagnosis not present

## 2021-08-06 DIAGNOSIS — G8194 Hemiplegia, unspecified affecting left nondominant side: Secondary | ICD-10-CM | POA: Diagnosis not present

## 2021-08-06 DIAGNOSIS — M159 Polyosteoarthritis, unspecified: Secondary | ICD-10-CM | POA: Diagnosis not present

## 2021-08-06 DIAGNOSIS — L03116 Cellulitis of left lower limb: Secondary | ICD-10-CM | POA: Diagnosis not present

## 2021-08-06 DIAGNOSIS — I69319 Unspecified symptoms and signs involving cognitive functions following cerebral infarction: Secondary | ICD-10-CM | POA: Diagnosis not present

## 2021-08-06 DIAGNOSIS — I272 Pulmonary hypertension, unspecified: Secondary | ICD-10-CM | POA: Diagnosis not present

## 2021-08-06 DIAGNOSIS — E1159 Type 2 diabetes mellitus with other circulatory complications: Secondary | ICD-10-CM | POA: Diagnosis not present

## 2021-08-06 DIAGNOSIS — L97812 Non-pressure chronic ulcer of other part of right lower leg with fat layer exposed: Secondary | ICD-10-CM | POA: Diagnosis not present

## 2021-08-06 DIAGNOSIS — E875 Hyperkalemia: Secondary | ICD-10-CM | POA: Diagnosis not present

## 2021-08-06 DIAGNOSIS — E1142 Type 2 diabetes mellitus with diabetic polyneuropathy: Secondary | ICD-10-CM | POA: Diagnosis not present

## 2021-08-06 DIAGNOSIS — E11621 Type 2 diabetes mellitus with foot ulcer: Secondary | ICD-10-CM | POA: Diagnosis not present

## 2021-08-06 DIAGNOSIS — I081 Rheumatic disorders of both mitral and tricuspid valves: Secondary | ICD-10-CM | POA: Diagnosis not present

## 2021-08-06 DIAGNOSIS — J449 Chronic obstructive pulmonary disease, unspecified: Secondary | ICD-10-CM | POA: Diagnosis not present

## 2021-08-07 DIAGNOSIS — E1142 Type 2 diabetes mellitus with diabetic polyneuropathy: Secondary | ICD-10-CM | POA: Diagnosis not present

## 2021-08-07 DIAGNOSIS — I4819 Other persistent atrial fibrillation: Secondary | ICD-10-CM | POA: Diagnosis not present

## 2021-08-07 DIAGNOSIS — L03116 Cellulitis of left lower limb: Secondary | ICD-10-CM | POA: Diagnosis not present

## 2021-08-07 DIAGNOSIS — L97522 Non-pressure chronic ulcer of other part of left foot with fat layer exposed: Secondary | ICD-10-CM | POA: Diagnosis not present

## 2021-08-07 DIAGNOSIS — N390 Urinary tract infection, site not specified: Secondary | ICD-10-CM | POA: Diagnosis not present

## 2021-08-07 DIAGNOSIS — M159 Polyosteoarthritis, unspecified: Secondary | ICD-10-CM | POA: Diagnosis not present

## 2021-08-07 DIAGNOSIS — J9601 Acute respiratory failure with hypoxia: Secondary | ICD-10-CM | POA: Diagnosis not present

## 2021-08-07 DIAGNOSIS — G319 Degenerative disease of nervous system, unspecified: Secondary | ICD-10-CM | POA: Diagnosis not present

## 2021-08-07 DIAGNOSIS — I152 Hypertension secondary to endocrine disorders: Secondary | ICD-10-CM | POA: Diagnosis not present

## 2021-08-07 DIAGNOSIS — E1159 Type 2 diabetes mellitus with other circulatory complications: Secondary | ICD-10-CM | POA: Diagnosis not present

## 2021-08-07 DIAGNOSIS — I69320 Aphasia following cerebral infarction: Secondary | ICD-10-CM | POA: Diagnosis not present

## 2021-08-07 DIAGNOSIS — A419 Sepsis, unspecified organism: Secondary | ICD-10-CM | POA: Diagnosis not present

## 2021-08-07 DIAGNOSIS — I890 Lymphedema, not elsewhere classified: Secondary | ICD-10-CM | POA: Diagnosis not present

## 2021-08-07 DIAGNOSIS — L97812 Non-pressure chronic ulcer of other part of right lower leg with fat layer exposed: Secondary | ICD-10-CM | POA: Diagnosis not present

## 2021-08-07 DIAGNOSIS — G8194 Hemiplegia, unspecified affecting left nondominant side: Secondary | ICD-10-CM | POA: Diagnosis not present

## 2021-08-07 DIAGNOSIS — I5033 Acute on chronic diastolic (congestive) heart failure: Secondary | ICD-10-CM | POA: Diagnosis not present

## 2021-08-07 DIAGNOSIS — J449 Chronic obstructive pulmonary disease, unspecified: Secondary | ICD-10-CM | POA: Diagnosis not present

## 2021-08-07 DIAGNOSIS — E1152 Type 2 diabetes mellitus with diabetic peripheral angiopathy with gangrene: Secondary | ICD-10-CM | POA: Diagnosis not present

## 2021-08-07 DIAGNOSIS — I69319 Unspecified symptoms and signs involving cognitive functions following cerebral infarction: Secondary | ICD-10-CM | POA: Diagnosis not present

## 2021-08-07 DIAGNOSIS — I081 Rheumatic disorders of both mitral and tricuspid valves: Secondary | ICD-10-CM | POA: Diagnosis not present

## 2021-08-07 DIAGNOSIS — L03115 Cellulitis of right lower limb: Secondary | ICD-10-CM | POA: Diagnosis not present

## 2021-08-07 DIAGNOSIS — I872 Venous insufficiency (chronic) (peripheral): Secondary | ICD-10-CM | POA: Diagnosis not present

## 2021-08-07 DIAGNOSIS — I272 Pulmonary hypertension, unspecified: Secondary | ICD-10-CM | POA: Diagnosis not present

## 2021-08-07 DIAGNOSIS — E875 Hyperkalemia: Secondary | ICD-10-CM | POA: Diagnosis not present

## 2021-08-07 DIAGNOSIS — E11621 Type 2 diabetes mellitus with foot ulcer: Secondary | ICD-10-CM | POA: Diagnosis not present

## 2021-08-11 DIAGNOSIS — L03116 Cellulitis of left lower limb: Secondary | ICD-10-CM | POA: Diagnosis not present

## 2021-08-11 DIAGNOSIS — I5033 Acute on chronic diastolic (congestive) heart failure: Secondary | ICD-10-CM | POA: Diagnosis not present

## 2021-08-11 DIAGNOSIS — E1152 Type 2 diabetes mellitus with diabetic peripheral angiopathy with gangrene: Secondary | ICD-10-CM | POA: Diagnosis not present

## 2021-08-11 DIAGNOSIS — I69320 Aphasia following cerebral infarction: Secondary | ICD-10-CM | POA: Diagnosis not present

## 2021-08-11 DIAGNOSIS — L97812 Non-pressure chronic ulcer of other part of right lower leg with fat layer exposed: Secondary | ICD-10-CM | POA: Diagnosis not present

## 2021-08-11 DIAGNOSIS — J9601 Acute respiratory failure with hypoxia: Secondary | ICD-10-CM | POA: Diagnosis not present

## 2021-08-11 DIAGNOSIS — G319 Degenerative disease of nervous system, unspecified: Secondary | ICD-10-CM | POA: Diagnosis not present

## 2021-08-11 DIAGNOSIS — A419 Sepsis, unspecified organism: Secondary | ICD-10-CM | POA: Diagnosis not present

## 2021-08-11 DIAGNOSIS — G8194 Hemiplegia, unspecified affecting left nondominant side: Secondary | ICD-10-CM | POA: Diagnosis not present

## 2021-08-11 DIAGNOSIS — E1142 Type 2 diabetes mellitus with diabetic polyneuropathy: Secondary | ICD-10-CM | POA: Diagnosis not present

## 2021-08-11 DIAGNOSIS — I890 Lymphedema, not elsewhere classified: Secondary | ICD-10-CM | POA: Diagnosis not present

## 2021-08-11 DIAGNOSIS — I4819 Other persistent atrial fibrillation: Secondary | ICD-10-CM | POA: Diagnosis not present

## 2021-08-11 DIAGNOSIS — E1159 Type 2 diabetes mellitus with other circulatory complications: Secondary | ICD-10-CM | POA: Diagnosis not present

## 2021-08-11 DIAGNOSIS — J449 Chronic obstructive pulmonary disease, unspecified: Secondary | ICD-10-CM | POA: Diagnosis not present

## 2021-08-11 DIAGNOSIS — N390 Urinary tract infection, site not specified: Secondary | ICD-10-CM | POA: Diagnosis not present

## 2021-08-11 DIAGNOSIS — E11621 Type 2 diabetes mellitus with foot ulcer: Secondary | ICD-10-CM | POA: Diagnosis not present

## 2021-08-11 DIAGNOSIS — I69319 Unspecified symptoms and signs involving cognitive functions following cerebral infarction: Secondary | ICD-10-CM | POA: Diagnosis not present

## 2021-08-11 DIAGNOSIS — I081 Rheumatic disorders of both mitral and tricuspid valves: Secondary | ICD-10-CM | POA: Diagnosis not present

## 2021-08-11 DIAGNOSIS — E875 Hyperkalemia: Secondary | ICD-10-CM | POA: Diagnosis not present

## 2021-08-11 DIAGNOSIS — I272 Pulmonary hypertension, unspecified: Secondary | ICD-10-CM | POA: Diagnosis not present

## 2021-08-11 DIAGNOSIS — I872 Venous insufficiency (chronic) (peripheral): Secondary | ICD-10-CM | POA: Diagnosis not present

## 2021-08-11 DIAGNOSIS — L97522 Non-pressure chronic ulcer of other part of left foot with fat layer exposed: Secondary | ICD-10-CM | POA: Diagnosis not present

## 2021-08-11 DIAGNOSIS — I152 Hypertension secondary to endocrine disorders: Secondary | ICD-10-CM | POA: Diagnosis not present

## 2021-08-11 DIAGNOSIS — L03115 Cellulitis of right lower limb: Secondary | ICD-10-CM | POA: Diagnosis not present

## 2021-08-11 DIAGNOSIS — M159 Polyosteoarthritis, unspecified: Secondary | ICD-10-CM | POA: Diagnosis not present

## 2021-08-12 DIAGNOSIS — I081 Rheumatic disorders of both mitral and tricuspid valves: Secondary | ICD-10-CM | POA: Diagnosis not present

## 2021-08-12 DIAGNOSIS — L97522 Non-pressure chronic ulcer of other part of left foot with fat layer exposed: Secondary | ICD-10-CM | POA: Diagnosis not present

## 2021-08-12 DIAGNOSIS — J9601 Acute respiratory failure with hypoxia: Secondary | ICD-10-CM | POA: Diagnosis not present

## 2021-08-12 DIAGNOSIS — M159 Polyosteoarthritis, unspecified: Secondary | ICD-10-CM | POA: Diagnosis not present

## 2021-08-12 DIAGNOSIS — I872 Venous insufficiency (chronic) (peripheral): Secondary | ICD-10-CM | POA: Diagnosis not present

## 2021-08-12 DIAGNOSIS — A419 Sepsis, unspecified organism: Secondary | ICD-10-CM | POA: Diagnosis not present

## 2021-08-12 DIAGNOSIS — E1159 Type 2 diabetes mellitus with other circulatory complications: Secondary | ICD-10-CM | POA: Diagnosis not present

## 2021-08-12 DIAGNOSIS — E11621 Type 2 diabetes mellitus with foot ulcer: Secondary | ICD-10-CM | POA: Diagnosis not present

## 2021-08-12 DIAGNOSIS — L97812 Non-pressure chronic ulcer of other part of right lower leg with fat layer exposed: Secondary | ICD-10-CM | POA: Diagnosis not present

## 2021-08-12 DIAGNOSIS — L03115 Cellulitis of right lower limb: Secondary | ICD-10-CM | POA: Diagnosis not present

## 2021-08-12 DIAGNOSIS — I69319 Unspecified symptoms and signs involving cognitive functions following cerebral infarction: Secondary | ICD-10-CM | POA: Diagnosis not present

## 2021-08-12 DIAGNOSIS — E1152 Type 2 diabetes mellitus with diabetic peripheral angiopathy with gangrene: Secondary | ICD-10-CM | POA: Diagnosis not present

## 2021-08-12 DIAGNOSIS — J449 Chronic obstructive pulmonary disease, unspecified: Secondary | ICD-10-CM | POA: Diagnosis not present

## 2021-08-12 DIAGNOSIS — I890 Lymphedema, not elsewhere classified: Secondary | ICD-10-CM | POA: Diagnosis not present

## 2021-08-12 DIAGNOSIS — E1142 Type 2 diabetes mellitus with diabetic polyneuropathy: Secondary | ICD-10-CM | POA: Diagnosis not present

## 2021-08-12 DIAGNOSIS — I272 Pulmonary hypertension, unspecified: Secondary | ICD-10-CM | POA: Diagnosis not present

## 2021-08-12 DIAGNOSIS — N390 Urinary tract infection, site not specified: Secondary | ICD-10-CM | POA: Diagnosis not present

## 2021-08-12 DIAGNOSIS — I5033 Acute on chronic diastolic (congestive) heart failure: Secondary | ICD-10-CM | POA: Diagnosis not present

## 2021-08-12 DIAGNOSIS — G319 Degenerative disease of nervous system, unspecified: Secondary | ICD-10-CM | POA: Diagnosis not present

## 2021-08-12 DIAGNOSIS — E875 Hyperkalemia: Secondary | ICD-10-CM | POA: Diagnosis not present

## 2021-08-12 DIAGNOSIS — I69320 Aphasia following cerebral infarction: Secondary | ICD-10-CM | POA: Diagnosis not present

## 2021-08-12 DIAGNOSIS — G8194 Hemiplegia, unspecified affecting left nondominant side: Secondary | ICD-10-CM | POA: Diagnosis not present

## 2021-08-12 DIAGNOSIS — I4819 Other persistent atrial fibrillation: Secondary | ICD-10-CM | POA: Diagnosis not present

## 2021-08-12 DIAGNOSIS — L03116 Cellulitis of left lower limb: Secondary | ICD-10-CM | POA: Diagnosis not present

## 2021-08-12 DIAGNOSIS — I152 Hypertension secondary to endocrine disorders: Secondary | ICD-10-CM | POA: Diagnosis not present

## 2021-08-13 DIAGNOSIS — M25512 Pain in left shoulder: Secondary | ICD-10-CM | POA: Diagnosis not present

## 2021-08-13 DIAGNOSIS — E119 Type 2 diabetes mellitus without complications: Secondary | ICD-10-CM | POA: Diagnosis not present

## 2021-08-15 DIAGNOSIS — I152 Hypertension secondary to endocrine disorders: Secondary | ICD-10-CM | POA: Diagnosis not present

## 2021-08-15 DIAGNOSIS — E11621 Type 2 diabetes mellitus with foot ulcer: Secondary | ICD-10-CM | POA: Diagnosis not present

## 2021-08-15 DIAGNOSIS — I890 Lymphedema, not elsewhere classified: Secondary | ICD-10-CM | POA: Diagnosis not present

## 2021-08-15 DIAGNOSIS — N390 Urinary tract infection, site not specified: Secondary | ICD-10-CM | POA: Diagnosis not present

## 2021-08-15 DIAGNOSIS — L03115 Cellulitis of right lower limb: Secondary | ICD-10-CM | POA: Diagnosis not present

## 2021-08-15 DIAGNOSIS — M159 Polyosteoarthritis, unspecified: Secondary | ICD-10-CM | POA: Diagnosis not present

## 2021-08-15 DIAGNOSIS — J9601 Acute respiratory failure with hypoxia: Secondary | ICD-10-CM | POA: Diagnosis not present

## 2021-08-15 DIAGNOSIS — E1152 Type 2 diabetes mellitus with diabetic peripheral angiopathy with gangrene: Secondary | ICD-10-CM | POA: Diagnosis not present

## 2021-08-15 DIAGNOSIS — E1142 Type 2 diabetes mellitus with diabetic polyneuropathy: Secondary | ICD-10-CM | POA: Diagnosis not present

## 2021-08-15 DIAGNOSIS — E875 Hyperkalemia: Secondary | ICD-10-CM | POA: Diagnosis not present

## 2021-08-15 DIAGNOSIS — I69320 Aphasia following cerebral infarction: Secondary | ICD-10-CM | POA: Diagnosis not present

## 2021-08-15 DIAGNOSIS — I5033 Acute on chronic diastolic (congestive) heart failure: Secondary | ICD-10-CM | POA: Diagnosis not present

## 2021-08-15 DIAGNOSIS — G319 Degenerative disease of nervous system, unspecified: Secondary | ICD-10-CM | POA: Diagnosis not present

## 2021-08-15 DIAGNOSIS — L97812 Non-pressure chronic ulcer of other part of right lower leg with fat layer exposed: Secondary | ICD-10-CM | POA: Diagnosis not present

## 2021-08-15 DIAGNOSIS — G8194 Hemiplegia, unspecified affecting left nondominant side: Secondary | ICD-10-CM | POA: Diagnosis not present

## 2021-08-15 DIAGNOSIS — E1159 Type 2 diabetes mellitus with other circulatory complications: Secondary | ICD-10-CM | POA: Diagnosis not present

## 2021-08-15 DIAGNOSIS — I69319 Unspecified symptoms and signs involving cognitive functions following cerebral infarction: Secondary | ICD-10-CM | POA: Diagnosis not present

## 2021-08-15 DIAGNOSIS — I081 Rheumatic disorders of both mitral and tricuspid valves: Secondary | ICD-10-CM | POA: Diagnosis not present

## 2021-08-15 DIAGNOSIS — L97522 Non-pressure chronic ulcer of other part of left foot with fat layer exposed: Secondary | ICD-10-CM | POA: Diagnosis not present

## 2021-08-15 DIAGNOSIS — I4819 Other persistent atrial fibrillation: Secondary | ICD-10-CM | POA: Diagnosis not present

## 2021-08-15 DIAGNOSIS — L03116 Cellulitis of left lower limb: Secondary | ICD-10-CM | POA: Diagnosis not present

## 2021-08-15 DIAGNOSIS — J449 Chronic obstructive pulmonary disease, unspecified: Secondary | ICD-10-CM | POA: Diagnosis not present

## 2021-08-15 DIAGNOSIS — A419 Sepsis, unspecified organism: Secondary | ICD-10-CM | POA: Diagnosis not present

## 2021-08-15 DIAGNOSIS — I872 Venous insufficiency (chronic) (peripheral): Secondary | ICD-10-CM | POA: Diagnosis not present

## 2021-08-15 DIAGNOSIS — I272 Pulmonary hypertension, unspecified: Secondary | ICD-10-CM | POA: Diagnosis not present

## 2021-08-19 ENCOUNTER — Ambulatory Visit: Payer: Medicare HMO | Attending: Pulmonary Disease

## 2021-08-19 ENCOUNTER — Encounter (INDEPENDENT_AMBULATORY_CARE_PROVIDER_SITE_OTHER): Payer: Medicare HMO | Admitting: Vascular Surgery

## 2021-08-19 ENCOUNTER — Telehealth: Payer: Self-pay | Admitting: Internal Medicine

## 2021-08-19 DIAGNOSIS — I152 Hypertension secondary to endocrine disorders: Secondary | ICD-10-CM | POA: Diagnosis not present

## 2021-08-19 DIAGNOSIS — G319 Degenerative disease of nervous system, unspecified: Secondary | ICD-10-CM | POA: Diagnosis not present

## 2021-08-19 DIAGNOSIS — I272 Pulmonary hypertension, unspecified: Secondary | ICD-10-CM | POA: Diagnosis not present

## 2021-08-19 DIAGNOSIS — L97522 Non-pressure chronic ulcer of other part of left foot with fat layer exposed: Secondary | ICD-10-CM | POA: Diagnosis not present

## 2021-08-19 DIAGNOSIS — I890 Lymphedema, not elsewhere classified: Secondary | ICD-10-CM | POA: Diagnosis not present

## 2021-08-19 DIAGNOSIS — E11621 Type 2 diabetes mellitus with foot ulcer: Secondary | ICD-10-CM | POA: Diagnosis not present

## 2021-08-19 DIAGNOSIS — M159 Polyosteoarthritis, unspecified: Secondary | ICD-10-CM | POA: Diagnosis not present

## 2021-08-19 DIAGNOSIS — G8194 Hemiplegia, unspecified affecting left nondominant side: Secondary | ICD-10-CM | POA: Diagnosis not present

## 2021-08-19 DIAGNOSIS — L03116 Cellulitis of left lower limb: Secondary | ICD-10-CM | POA: Diagnosis not present

## 2021-08-19 DIAGNOSIS — L03115 Cellulitis of right lower limb: Secondary | ICD-10-CM | POA: Diagnosis not present

## 2021-08-19 DIAGNOSIS — L97812 Non-pressure chronic ulcer of other part of right lower leg with fat layer exposed: Secondary | ICD-10-CM | POA: Diagnosis not present

## 2021-08-19 DIAGNOSIS — J449 Chronic obstructive pulmonary disease, unspecified: Secondary | ICD-10-CM | POA: Diagnosis not present

## 2021-08-19 DIAGNOSIS — E1159 Type 2 diabetes mellitus with other circulatory complications: Secondary | ICD-10-CM | POA: Diagnosis not present

## 2021-08-19 DIAGNOSIS — I69320 Aphasia following cerebral infarction: Secondary | ICD-10-CM | POA: Diagnosis not present

## 2021-08-19 DIAGNOSIS — E1142 Type 2 diabetes mellitus with diabetic polyneuropathy: Secondary | ICD-10-CM | POA: Diagnosis not present

## 2021-08-19 DIAGNOSIS — N390 Urinary tract infection, site not specified: Secondary | ICD-10-CM | POA: Diagnosis not present

## 2021-08-19 DIAGNOSIS — E875 Hyperkalemia: Secondary | ICD-10-CM | POA: Diagnosis not present

## 2021-08-19 DIAGNOSIS — I4819 Other persistent atrial fibrillation: Secondary | ICD-10-CM | POA: Diagnosis not present

## 2021-08-19 DIAGNOSIS — E1152 Type 2 diabetes mellitus with diabetic peripheral angiopathy with gangrene: Secondary | ICD-10-CM | POA: Diagnosis not present

## 2021-08-19 DIAGNOSIS — I5033 Acute on chronic diastolic (congestive) heart failure: Secondary | ICD-10-CM | POA: Diagnosis not present

## 2021-08-19 DIAGNOSIS — I69319 Unspecified symptoms and signs involving cognitive functions following cerebral infarction: Secondary | ICD-10-CM | POA: Diagnosis not present

## 2021-08-19 DIAGNOSIS — I081 Rheumatic disorders of both mitral and tricuspid valves: Secondary | ICD-10-CM | POA: Diagnosis not present

## 2021-08-19 DIAGNOSIS — J9601 Acute respiratory failure with hypoxia: Secondary | ICD-10-CM | POA: Diagnosis not present

## 2021-08-19 DIAGNOSIS — I872 Venous insufficiency (chronic) (peripheral): Secondary | ICD-10-CM | POA: Diagnosis not present

## 2021-08-19 DIAGNOSIS — A419 Sepsis, unspecified organism: Secondary | ICD-10-CM | POA: Diagnosis not present

## 2021-08-19 NOTE — Telephone Encounter (Signed)
There was recall on xanax please contact her pharmacy her insurance let me know inform

## 2021-08-21 DIAGNOSIS — I890 Lymphedema, not elsewhere classified: Secondary | ICD-10-CM | POA: Diagnosis not present

## 2021-08-21 DIAGNOSIS — E11621 Type 2 diabetes mellitus with foot ulcer: Secondary | ICD-10-CM | POA: Diagnosis not present

## 2021-08-21 DIAGNOSIS — J9601 Acute respiratory failure with hypoxia: Secondary | ICD-10-CM | POA: Diagnosis not present

## 2021-08-21 DIAGNOSIS — I272 Pulmonary hypertension, unspecified: Secondary | ICD-10-CM | POA: Diagnosis not present

## 2021-08-21 DIAGNOSIS — G319 Degenerative disease of nervous system, unspecified: Secondary | ICD-10-CM | POA: Diagnosis not present

## 2021-08-21 DIAGNOSIS — L03115 Cellulitis of right lower limb: Secondary | ICD-10-CM | POA: Diagnosis not present

## 2021-08-21 DIAGNOSIS — I69319 Unspecified symptoms and signs involving cognitive functions following cerebral infarction: Secondary | ICD-10-CM | POA: Diagnosis not present

## 2021-08-21 DIAGNOSIS — I152 Hypertension secondary to endocrine disorders: Secondary | ICD-10-CM | POA: Diagnosis not present

## 2021-08-21 DIAGNOSIS — M159 Polyosteoarthritis, unspecified: Secondary | ICD-10-CM | POA: Diagnosis not present

## 2021-08-21 DIAGNOSIS — L03116 Cellulitis of left lower limb: Secondary | ICD-10-CM | POA: Diagnosis not present

## 2021-08-21 DIAGNOSIS — I69320 Aphasia following cerebral infarction: Secondary | ICD-10-CM | POA: Diagnosis not present

## 2021-08-21 DIAGNOSIS — J449 Chronic obstructive pulmonary disease, unspecified: Secondary | ICD-10-CM | POA: Diagnosis not present

## 2021-08-21 DIAGNOSIS — I872 Venous insufficiency (chronic) (peripheral): Secondary | ICD-10-CM | POA: Diagnosis not present

## 2021-08-21 DIAGNOSIS — E1152 Type 2 diabetes mellitus with diabetic peripheral angiopathy with gangrene: Secondary | ICD-10-CM | POA: Diagnosis not present

## 2021-08-21 DIAGNOSIS — I4819 Other persistent atrial fibrillation: Secondary | ICD-10-CM | POA: Diagnosis not present

## 2021-08-21 DIAGNOSIS — I5033 Acute on chronic diastolic (congestive) heart failure: Secondary | ICD-10-CM | POA: Diagnosis not present

## 2021-08-21 DIAGNOSIS — E1142 Type 2 diabetes mellitus with diabetic polyneuropathy: Secondary | ICD-10-CM | POA: Diagnosis not present

## 2021-08-21 DIAGNOSIS — L97522 Non-pressure chronic ulcer of other part of left foot with fat layer exposed: Secondary | ICD-10-CM | POA: Diagnosis not present

## 2021-08-21 DIAGNOSIS — A419 Sepsis, unspecified organism: Secondary | ICD-10-CM | POA: Diagnosis not present

## 2021-08-21 DIAGNOSIS — N390 Urinary tract infection, site not specified: Secondary | ICD-10-CM | POA: Diagnosis not present

## 2021-08-21 DIAGNOSIS — L97812 Non-pressure chronic ulcer of other part of right lower leg with fat layer exposed: Secondary | ICD-10-CM | POA: Diagnosis not present

## 2021-08-21 DIAGNOSIS — G8194 Hemiplegia, unspecified affecting left nondominant side: Secondary | ICD-10-CM | POA: Diagnosis not present

## 2021-08-21 DIAGNOSIS — E1159 Type 2 diabetes mellitus with other circulatory complications: Secondary | ICD-10-CM | POA: Diagnosis not present

## 2021-08-21 DIAGNOSIS — I081 Rheumatic disorders of both mitral and tricuspid valves: Secondary | ICD-10-CM | POA: Diagnosis not present

## 2021-08-21 DIAGNOSIS — E875 Hyperkalemia: Secondary | ICD-10-CM | POA: Diagnosis not present

## 2021-08-27 DIAGNOSIS — A419 Sepsis, unspecified organism: Secondary | ICD-10-CM | POA: Diagnosis not present

## 2021-08-27 DIAGNOSIS — I081 Rheumatic disorders of both mitral and tricuspid valves: Secondary | ICD-10-CM | POA: Diagnosis not present

## 2021-08-27 DIAGNOSIS — I872 Venous insufficiency (chronic) (peripheral): Secondary | ICD-10-CM | POA: Diagnosis not present

## 2021-08-27 DIAGNOSIS — G8194 Hemiplegia, unspecified affecting left nondominant side: Secondary | ICD-10-CM | POA: Diagnosis not present

## 2021-08-27 DIAGNOSIS — I272 Pulmonary hypertension, unspecified: Secondary | ICD-10-CM | POA: Diagnosis not present

## 2021-08-27 DIAGNOSIS — J449 Chronic obstructive pulmonary disease, unspecified: Secondary | ICD-10-CM | POA: Diagnosis not present

## 2021-08-27 DIAGNOSIS — I69320 Aphasia following cerebral infarction: Secondary | ICD-10-CM | POA: Diagnosis not present

## 2021-08-27 DIAGNOSIS — I69319 Unspecified symptoms and signs involving cognitive functions following cerebral infarction: Secondary | ICD-10-CM | POA: Diagnosis not present

## 2021-08-27 DIAGNOSIS — I890 Lymphedema, not elsewhere classified: Secondary | ICD-10-CM | POA: Diagnosis not present

## 2021-08-27 DIAGNOSIS — I152 Hypertension secondary to endocrine disorders: Secondary | ICD-10-CM | POA: Diagnosis not present

## 2021-08-27 DIAGNOSIS — E875 Hyperkalemia: Secondary | ICD-10-CM | POA: Diagnosis not present

## 2021-08-27 DIAGNOSIS — L97812 Non-pressure chronic ulcer of other part of right lower leg with fat layer exposed: Secondary | ICD-10-CM | POA: Diagnosis not present

## 2021-08-27 DIAGNOSIS — M159 Polyosteoarthritis, unspecified: Secondary | ICD-10-CM | POA: Diagnosis not present

## 2021-08-27 DIAGNOSIS — L97522 Non-pressure chronic ulcer of other part of left foot with fat layer exposed: Secondary | ICD-10-CM | POA: Diagnosis not present

## 2021-08-27 DIAGNOSIS — L03115 Cellulitis of right lower limb: Secondary | ICD-10-CM | POA: Diagnosis not present

## 2021-08-27 DIAGNOSIS — E1159 Type 2 diabetes mellitus with other circulatory complications: Secondary | ICD-10-CM | POA: Diagnosis not present

## 2021-08-27 DIAGNOSIS — I4819 Other persistent atrial fibrillation: Secondary | ICD-10-CM | POA: Diagnosis not present

## 2021-08-27 DIAGNOSIS — I5033 Acute on chronic diastolic (congestive) heart failure: Secondary | ICD-10-CM | POA: Diagnosis not present

## 2021-08-27 DIAGNOSIS — J9601 Acute respiratory failure with hypoxia: Secondary | ICD-10-CM | POA: Diagnosis not present

## 2021-08-27 DIAGNOSIS — E1152 Type 2 diabetes mellitus with diabetic peripheral angiopathy with gangrene: Secondary | ICD-10-CM | POA: Diagnosis not present

## 2021-08-27 DIAGNOSIS — E1142 Type 2 diabetes mellitus with diabetic polyneuropathy: Secondary | ICD-10-CM | POA: Diagnosis not present

## 2021-08-27 DIAGNOSIS — G319 Degenerative disease of nervous system, unspecified: Secondary | ICD-10-CM | POA: Diagnosis not present

## 2021-08-27 DIAGNOSIS — L03116 Cellulitis of left lower limb: Secondary | ICD-10-CM | POA: Diagnosis not present

## 2021-08-27 DIAGNOSIS — N390 Urinary tract infection, site not specified: Secondary | ICD-10-CM | POA: Diagnosis not present

## 2021-08-27 DIAGNOSIS — E11621 Type 2 diabetes mellitus with foot ulcer: Secondary | ICD-10-CM | POA: Diagnosis not present

## 2021-09-05 ENCOUNTER — Other Ambulatory Visit: Payer: Self-pay | Admitting: Internal Medicine

## 2021-09-05 ENCOUNTER — Telehealth: Payer: Self-pay | Admitting: Internal Medicine

## 2021-09-05 DIAGNOSIS — E1142 Type 2 diabetes mellitus with diabetic polyneuropathy: Secondary | ICD-10-CM

## 2021-09-05 DIAGNOSIS — M797 Fibromyalgia: Secondary | ICD-10-CM

## 2021-09-05 NOTE — Telephone Encounter (Signed)
Pt needs evaluation of this either urgent care for now or long term I recommend she follow up with vascular and or wound for treatment  Please call [place] wound [facility] or vascular for appt asap ive also messaged the clinics

## 2021-09-05 NOTE — Telephone Encounter (Signed)
Pt needs evaluation of this either urgent care for now or long term I recommend she follow up with vascular and or wound for treatment  Please call [place] wound clinic or vascular for appt asap ive also messaged the clinics        Mora, Karna Dupes, [place] to [place]      09/05/21 10:56 AM S/w pt about in regards to her leg issue, she stated she is having a cellulitis flare up, and is requesting cephalexin to be sent in, she says this has happened to her before and this is what you have prescribed her in the past which has helped. Informed her we can't guarantee something will be sent in b/c she may need in person evaluation informed her no openings are available today but she can go to UC or ED if nothing is sent in.   Pt verbalized understanding.     AD   09/05/21 10:38 AM Burt Knack D routed this conversation to [facility], [place], CMA  Burt Knack D   AD   09/05/21 10:38 AM Note Pt called stating she would like someone to call her regarding her legs           09/05/21 10:36 AM  [name] contacted Burt Knack D   This encounter is not signed. The conversation may still be ongoing. Orders Placed  None Medication Renewals and Changes   None Medication List

## 2021-09-05 NOTE — Telephone Encounter (Signed)
Pt called stating she would like someone to call her regarding her legs

## 2021-09-09 NOTE — Telephone Encounter (Signed)
Please reach out to patient.

## 2021-09-10 NOTE — Telephone Encounter (Signed)
Called to sch patient and she stated she will call the office by 09/16/21 if she needs an appt. Stated she thinks she is ok for right now but will call the minute that changes

## 2021-09-22 ENCOUNTER — Other Ambulatory Visit: Payer: Self-pay | Admitting: Cardiovascular Disease

## 2021-09-22 ENCOUNTER — Other Ambulatory Visit: Payer: Self-pay | Admitting: Internal Medicine

## 2021-09-22 DIAGNOSIS — J309 Allergic rhinitis, unspecified: Secondary | ICD-10-CM

## 2021-09-22 DIAGNOSIS — Z8673 Personal history of transient ischemic attack (TIA), and cerebral infarction without residual deficits: Secondary | ICD-10-CM

## 2021-09-24 ENCOUNTER — Ambulatory Visit: Payer: Medicare HMO | Admitting: Internal Medicine

## 2021-09-29 ENCOUNTER — Inpatient Hospital Stay: Payer: Medicare HMO | Attending: Oncology

## 2021-10-03 ENCOUNTER — Other Ambulatory Visit: Payer: Self-pay | Admitting: Internal Medicine

## 2021-10-03 DIAGNOSIS — T148XXA Other injury of unspecified body region, initial encounter: Secondary | ICD-10-CM

## 2021-10-17 DIAGNOSIS — D509 Iron deficiency anemia, unspecified: Secondary | ICD-10-CM | POA: Diagnosis not present

## 2021-10-17 DIAGNOSIS — R1314 Dysphagia, pharyngoesophageal phase: Secondary | ICD-10-CM | POA: Diagnosis not present

## 2021-10-17 DIAGNOSIS — K219 Gastro-esophageal reflux disease without esophagitis: Secondary | ICD-10-CM | POA: Diagnosis not present

## 2021-10-17 DIAGNOSIS — Z86010 Personal history of colonic polyps: Secondary | ICD-10-CM | POA: Diagnosis not present

## 2021-10-17 DIAGNOSIS — K317 Polyp of stomach and duodenum: Secondary | ICD-10-CM | POA: Diagnosis not present

## 2021-10-20 ENCOUNTER — Telehealth: Payer: Self-pay | Admitting: Cardiovascular Disease

## 2021-10-20 NOTE — Telephone Encounter (Signed)
Patient with diagnosis of A Fib on Eliquis for anticoagulation.    Procedure: upper endoscopy Date of procedure: 01/23/22   CHA2DS2-VASc Score = 8  This indicates a 10.8% annual risk of stroke. The patient's score is based upon: CHF History: 1 HTN History: 1 Diabetes History: 1 Stroke History: 2 Vascular Disease History: 1 Age Score: 1 Gender Score: 1   CrCl 52 mL/min using adjusted body weight Platelet count 208K  Patient will be very high risk off anticoagulation. Per office protocol, patient can hold Eliquis for 1 day prior to procedure. If a longer hold is needed, will need authorization from cardiologist   **This guidance is not considered finalized until pre-operative APP has relayed final recommendations.**

## 2021-10-20 NOTE — Telephone Encounter (Signed)
   Patient Name: [name]  DOB: [birth year] MRN: [id number]  Primary Cardiologist: Kathlyn Sacramento, MD  Chart reviewed as part of pre-operative protocol coverage.   Patient with diagnosis of A Fib on Eliquis for anticoagulation.    CHA2DS2-VASc Score = 8  This indicates a 10.8% annual risk of stroke. The patient's score is based upon: CHF History: 1 HTN History: 1 Diabetes History: 1 Stroke History: 2 Vascular Disease History: 1 Age Score: 1 Gender Score: 1   CrCl 52 mL/min using adjusted body weight Platelet count 208K   Patient will be very high risk off anticoagulation. Per office protocol, patient can hold Eliquis for 1 day prior to procedure. If a longer hold is needed, will need authorization from cardiologist     Procedure: upper endoscopy Date of procedure: 01/23/22   Mable Fill, Marissa Nestle, NP 10/20/2021, 11:49 AM

## 2021-10-20 NOTE — Telephone Encounter (Signed)
   Pre-operative Risk Assessment    Patient Name: [name]  DOB: [birth year] MRN: [id number]     Request for Surgical Clearance    Procedure:   UPPER ENDOSCOPY  Date of Surgery:  Clearance 01/23/22                               Surgeon:  DR Andrey Farmer Surgeon's Group or Practice Name:  [facility] Phone number:  [phone] Fax number:  [phone]  Type of Clearance Requested:   - Pharmacy:  Hold Apixaban (Eliquis)    Type of Anesthesia:  Not Indicated   Additional requests/questions:    Signed, Eli Phillips   10/20/2021, 10:48 AM

## 2021-10-21 ENCOUNTER — Other Ambulatory Visit: Payer: Self-pay | Admitting: Internal Medicine

## 2021-10-21 DIAGNOSIS — F418 Other specified anxiety disorders: Secondary | ICD-10-CM

## 2021-10-23 ENCOUNTER — Inpatient Hospital Stay: Payer: Medicare HMO | Attending: Oncology

## 2021-10-23 DIAGNOSIS — M19019 Primary osteoarthritis, unspecified shoulder: Secondary | ICD-10-CM | POA: Diagnosis not present

## 2021-10-23 DIAGNOSIS — M1991 Primary osteoarthritis, unspecified site: Secondary | ICD-10-CM | POA: Diagnosis not present

## 2021-10-23 DIAGNOSIS — D509 Iron deficiency anemia, unspecified: Secondary | ICD-10-CM | POA: Diagnosis not present

## 2021-10-23 LAB — CBC WITH DIFFERENTIAL/PLATELET
Abs Immature Granulocytes: 0.02 10*3/uL (ref 0.00–0.07)
Basophils Absolute: 0.1 10*3/uL (ref 0.0–0.1)
Basophils Relative: 1 %
Eosinophils Absolute: 0.3 10*3/uL (ref 0.0–0.5)
Eosinophils Relative: 4 %
HCT: 37.2 % (ref 36.0–46.0)
Hemoglobin: 11.9 g/dL — ABNORMAL LOW (ref 12.0–15.0)
Immature Granulocytes: 0 %
Lymphocytes Relative: 16 %
Lymphs Abs: 1.1 10*3/uL (ref 0.7–4.0)
MCH: 29.5 pg (ref 26.0–34.0)
MCHC: 32 g/dL (ref 30.0–36.0)
MCV: 92.1 fL (ref 80.0–100.0)
Monocytes Absolute: 0.7 10*3/uL (ref 0.1–1.0)
Monocytes Relative: 10 %
Neutro Abs: 4.9 10*3/uL (ref 1.7–7.7)
Neutrophils Relative %: 69 %
Platelets: 203 10*3/uL (ref 150–400)
RBC: 4.04 MIL/uL (ref 3.87–5.11)
RDW: 14.3 % (ref 11.5–15.5)
WBC: 7 10*3/uL (ref 4.0–10.5)
nRBC: 0 % (ref 0.0–0.2)

## 2021-10-23 LAB — FERRITIN: Ferritin: 45 ng/mL (ref 11–307)

## 2021-10-23 LAB — IRON AND TIBC
Iron: 79 ug/dL (ref 28–170)
Saturation Ratios: 20 % (ref 10.4–31.8)
TIBC: 396 ug/dL (ref 250–450)
UIBC: 317 ug/dL

## 2021-10-28 NOTE — Progress Notes (Unsigned)
PROVIDER NOTE: Information contained herein reflects review and annotations entered in association with encounter. Interpretation of such information and data should be left to medically-trained personnel. Information provided to patient can be located elsewhere in the medical record under "Patient Instructions". Document created using STT-dictation technology, any transcriptional errors that may result from process are unintentional.    Patient: [name]  Service Category: E/M  Provider: Gaspar Cola, MD  DOB: 1950-08-27  DOS: 10/29/2021  Referring Provider: Orland Mustard *  MRN: [id number]  Specialty: Interventional Pain Management  PCP: [name], [name], MD  Type: Established Patient  Setting: Ambulatory outpatient    Location: Office  Delivery: Face-to-face     HPI  [name], a 71 y.o. year old adult, is here today because of her Chronic pain syndrome [G89.4]. [name] primary complain today is Shoulder Problem (left) Last encounter: My last encounter with her was on 07/30/2021. Pertinent problems: Ms. Leaton has Diabetic peripheral neuropathy associated with type 2 diabetes mellitus ([place]); Other shoulder lesions (Right); Neuropathy of lateral femoral cutaneous nerve (Right); Arthralgia; Osteoarthritis involving multiple joints; Status post reverse total shoulder replacement; Chronic pain syndrome; Chronic shoulder pain (Right); Carpal tunnel syndrome; Cervical radiculitis; Cubital tunnel syndrome (Right); Impingement syndrome of shoulder region; Pain in joint involving ankle and foot; Spinal stenosis of thoracic region; Hip joint pain (Left); Abnormality of gait and mobility; Diabetic ulcer of left foot ([place]); Muscle strain of hip (Left); Hip strain (Right); Ischial pain, right; Fibromyalgia syndrome; Chronic musculoskeletal pain; Peripheral vascular disease ([place]); Chronic ulcer of right leg ([place]); Meralgia paresthetica (Right); Chronic pain of right ankle;  Arthritis; Left ankle pain; Chronic shoulder pain (Left); Left hemiparesis ([place]); Primary osteoarthritis of left shoulder; Rotator cuff disorder, left; Ulcer of left heel and midfoot (HCC); PAD (peripheral artery disease) ([place]); Peripheral neurogenic pain; Chronic peripheral neuropathic pain; Chronic shoulder pain (Bilateral); Impaired range of motion of shoulder (Left); Acute pain of shoulder (Left); and Abnormal MRI, shoulder on their pertinent problem list. Pain Assessment: Severity of Chronic pain is reported as a 8 /10. Location: Shoulder Left/ . Onset: More than a month ago. Quality: Throbbing, Tingling, Aching, Constant. Timing: Constant. Modifying factor(s): meds and procedures. Vitals:  height is 5' 6" (1.676 m) and weight is 259 lb (117.5 kg). Her temperature is 97.3 F (36.3 C) (abnormal). Her blood pressure is 143/62 (abnormal) and her pulse is 80. Her oxygen saturation is 98%.   Reason for encounter: medication management.  The patient indicates doing well with the current medication regimen. No adverse reactions or side effects reported to the medications.  Today the patient indicates that she needs more pain medication because what she is getting is not enough.  However, in reviewing the medical records and her PMP, on 07/30/2021 we provided the patient with 3 prescriptions for hydrocodone, 1 to be filled on 07/11/2021 the second to be filled on 09/06/2021, and the last 1 to be filled on 10/06/2021.  According to the patient's PMP she had 1 of these prescriptions filled on 09/06/2021 and the second on 10/08/2021.  Out of the 2 prescriptions that she did have filled, today we see that she still has 9 pills left.  She did not fill the last prescription.  This means that she is not even using what we are actually giving her and therefore I will not be increasing the medicine or giving her more pills.  In fact, today where we are calling her pharmacy to cancel the last prescription  from 07/30/2021 that she  never filled.  When I asked the patient the reason for requesting more medicine she indicated that she is having a lot of pain in her left shoulder.  She previously had her right shoulder replaced and she indicated that her left shoulder is getting to the point where it may need a replacement, but her original orthopedic surgeon indicated that he would not be doing it secondary to her medical problems and her recent stroke.  Apparently, to get around this, the patient has gone to [facility] where she refers that on Aug 13, 2021 she had her left shoulder injected by Dr. Thalia Party.  She indicated that this provided her with relief of the pain for approximately 3-1/2 weeks, but that the pain has returned.  Review of the medical record indicates that on 01/30/2021 the patient had a left shoulder MRI done showing: 1. Severe osteoarthritis of the left glenohumeral joint. 2. Moderate tendinosis of the supraspinatus tendon with a small partial-thickness articular surface tear.  The patient indicates that she was recently seen by [facility] and they have offered to some additional left shoulder injections.  At this time I stopped the patient and I reminded her that 2 years ago we quit taking patients for medication management and that our policy is that should any patient needed any interventional therapies, we will provide those for them.  She indicated that she remembered me having informed her of that in the past.  Today I have reminded the her that should she prefer to have this injections at [facility], this would be fine with [place], but we would immediately have to transfer her opioid analgesic management to their practice.  With this in mind, I asked her if she wanted me to schedule her for the left shoulder injection and she indicated that she needs to consult this with her husband.  I have informed the patient what I can offer her in terms of therapeutic joint injections as well as diagnostic  suprascapular nerve blocks with possible follow-up with radiofrequency as an alternative for the long-term management of her pain, should she end up not being a candidate for surgery.  I also reminded the patient that should she become a candidate for surgery, I will be more than glad to refer her back to [facility] for the replacement.  She understood and accepted.  UDS ordered today.   RTCB: 02/03/2022 Nonopioids transferred 02/19/2020: Lyrica and Cymbalta  Pharmacotherapy Assessment  Analgesic: Hydrocodone/APAP 5/325 mg, 1 tab PO QD PRN (5 mg/day of hydrocodone) MME/day: 5 mg/day.   Monitoring: [place] PMP: PDMP reviewed during this encounter.       Pharmacotherapy: No side-effects or adverse reactions reported. Compliance: No problems identified. Effectiveness: Clinically acceptable.  Chauncey Fischer, RN  10/29/2021 12:57 PM  Sign when Signing Visit Nursing Pain Medication Assessment:  Safety precautions to be maintained throughout the outpatient stay will include: orient to surroundings, keep bed in low position, maintain call bell within reach at all times, provide assistance with transfer out of bed and ambulation.  Medication Inspection Compliance: Pill count conducted under aseptic conditions, in front of the patient. Neither the pills nor the bottle was removed from the patient's sight at any time. Once count was completed pills were immediately returned to the patient in their original bottle.  Medication: Hydrocodone/APAP Pill/Patch Count:  9 of 45 pills remain Pill/Patch Appearance: Markings consistent with prescribed medication Bottle Appearance: Standard pharmacy container. Clearly labeled. Filled Date: 7 / 69 /  2023 Last Medication intake:  TodaySafety precautions to be maintained throughout the outpatient stay will include: orient to surroundings, keep bed in low position, maintain call bell within reach at all times, provide assistance with transfer out of bed and ambulation.      UDS:  Summary  Date Value Ref Range Status  11/13/2020 Note  Final    Comment:    ==================================================================== ToxASSURE Select 13 (MW) ==================================================================== Test                             Result       Flag       Units  Drug Present and Declared for Prescription Verification   Alprazolam                     32           EXPECTED   ng/mg creat   Alpha-hydroxyalprazolam        58           EXPECTED   ng/mg creat    Source of alprazolam is a scheduled prescription medication. Alpha-    hydroxyalprazolam is an expected metabolite of alprazolam.    Hydrocodone                    268          EXPECTED   ng/mg creat   Dihydrocodeine                 93           EXPECTED   ng/mg creat   Norhydrocodone                 401          EXPECTED   ng/mg creat    Sources of hydrocodone include scheduled prescription medications.    Dihydrocodeine and norhydrocodone are expected metabolites of    hydrocodone. Dihydrocodeine is also available as a scheduled    prescription medication.  ==================================================================== Test                      Result    Flag   Units      Ref Range   Creatinine              88               mg/dL      >=20 ==================================================================== Declared Medications:  The flagging and interpretation on this report are based on the  following declared medications.  Unexpected results may arise from  inaccuracies in the declared medications.   **Note: The testing scope of this panel includes these medications:   Alprazolam (Xanax)  Hydrocodone (Norco)   **Note: The testing scope of this panel does not include the  following reported medications:   Acetaminophen (Tylenol)  Acetaminophen (Norco)  Albuterol (Proventil HFA)  Apixaban (Eliquis)  Bupropion (Wellbutrin XL)  Calcium  Cholecalciferol   Clobetasol  Colchicine  Cyanocobalamin  Diclofenac (Voltaren)  Doxycycline  Duloxetine (Cymbalta)  Estradiol (Estrace)  Fesoterodine (Toviaz)  Fluticasone (Advair)  Folic Acid  Levocetirizine (Xyzal)  Levothyroxine (Synthroid)  Metformin (Glucophage)  Metoprolol (Toprol)  Montelukast (Singulair)  Multivitamin  Mupirocin (Bactroban)  Nitrofurantoin (Macrobid)  Ondansetron (Zofran)  Oxybutynin (Ditropan)  Pantoprazole (Protonix)  Pregabalin (Lyrica)  Rosuvastatin (Crestor)  Salmeterol (Advair)  Sitagliptin (Januvia)  Sucralfate (Carafate)  Sulfamethoxazole (Bactrim)  Thiamine  Topical  Trimethoprim (Bactrim)  Vitamin D  Vitamin D2 ==================================================================== For clinical consultation, please call [phone]. ====================================================================      ROS  Constitutional: Denies any fever or chills Gastrointestinal: No reported hemesis, hematochezia, vomiting, or acute GI distress Musculoskeletal: Denies any acute onset joint swelling, redness, loss of ROM, or weakness Neurological: No reported episodes of acute onset apraxia, aphasia, dysarthria, agnosia, amnesia, paralysis, loss of coordination, or loss of consciousness  Medication Review  ALPRAZolam, DULoxetine, Fluticasone-Umeclidin-Vilant, Gold Bond No Mess Body Powder, HYDROcodone-acetaminophen, Tub Transfer Board, Vibegron, acetaminophen, albuterol, apixaban, benzonatate, buPROPion, calcium-vitamin D, clobetasol cream, colchicine, diclofenac Sodium, ergocalciferol, estradiol, fluconazole, folic acid, furosemide, levocetirizine, levothyroxine, losartan, metFORMIN, metoprolol succinate, montelukast, multivitamin with minerals, mupirocin ointment, nystatin cream, ondansetron, pantoprazole, pregabalin, rosuvastatin, sitaGLIPtin, sucralfate, thiamine, and vitamin D3  History Review  Allergy: [name] is allergic to morphine and related,  penicillins, ambien [zolpidem], aspirin, and oxytetracycline. Drug: [name]  reports no history of drug use. Alcohol:  reports no history of alcohol use. Tobacco:  reports that she has never smoked. She has never used smokeless tobacco. Social: [name]  reports that she has never smoked. She has never used smokeless tobacco. She reports that she does not drink alcohol and does not use drugs. Medical:  has a past medical history of Abnormal antibody titer, Anxiety and depression, Arthritis, Asthma, Atypical chest pain, Carotid arterial disease ([place]), Chronic heart failure with preserved ejection fraction (HFpEF) ([place]), CKD (chronic kidney disease), stage III ([place]), Cystocele, Depression, Diabetes ([place]), DVT (deep venous thrombosis) ([place]), Dyspnea, Edema, GERD (gastroesophageal reflux disease), Heart murmur, History of IBS, History of kidney stones, History of methicillin resistant staphylococcus aureus (MRSA) (2008), History of multiple strokes, Hyperlipidemia, Hypertension, Hypothyroidism, Insomnia, Kidney stone, Mitral regurgitation, Neuropathy involving both lower extremities, Non-healing ulcer of left foot ([place]), Obesity, Class II, BMI 35-39.9, with comorbidity, PAF (paroxysmal atrial fibrillation) ([place]), PAH (pulmonary artery hypertension) ([place]), Peripheral vascular disease ([place]), Sleep apnea, Stroke ([place]), Tricuspid regurgitation, Urinary incontinence, and Venous stasis dermatitis of both lower extremities. Surgical: [name]  has a past surgical history that includes Ankle surgery (Right); Vaginal hysterectomy; Sleeve Gastroplasty; Lithotripsy; Kidney surgery (Right); transthoracic echocardiogram (08/03/2013); transesophageal echocardiogram (08/08/2013); Tonsillectomy; Total knee arthroplasty (Left); Hiatal hernia repair; Cholecystectomy; I & D extremity (Left, 06/27/2015); Application if wound vac (Left, 06/27/2015); removal of left hematoma (Left); NM GATED MYOVIEW [facility] HX) (February 2017);  Reverse shoulder arthroplasty (Right, 10/08/2015); Hernia repair; Ulnar nerve transposition (Right, 08/06/2016); arm surgery; Irrigation and debridement abscess (Left, 09/07/2016); Incision and drainage of wound (Left, 09/25/2016); Application if wound vac (Left, 09/25/2016); Colonoscopy; Upper gi endoscopy; Esophagogastroduodenoscopy (egd) with propofol (N/A, 01/11/2017); Colonoscopy with propofol (N/A, 01/11/2017); Colonoscopy with propofol (N/A, 02/22/2017); OTHER SURGICAL HISTORY; Total shoulder replacement (Right); Joint replacement (2013); Colonoscopy with propofol (N/A, 05/31/2017); Lower Extremity Angiography (Left, 04/04/2018); Esophagogastroduodenoscopy (egd) with propofol (N/A, 10/25/2018); Incision and drainage of wound (Left, 11/09/2018); and Esophagogastroduodenoscopy (egd) with propofol (N/A, 11/29/2019). Family: family history includes Alcohol abuse in her father; Arthritis in her maternal grandfather, maternal grandmother, mother, paternal grandfather, and paternal grandmother; Breast cancer in her maternal aunt; Cancer in her father and maternal aunt; Cerebral aneurysm in her father; Diabetes in her father, maternal grandmother, and paternal grandmother; Heart disease in her maternal grandfather; Hypertension in her father, maternal grandfather, maternal grandmother, paternal grandfather, and paternal grandmother; Kidney disease in her mother; Mental illness in her sister; Peripheral vascular disease in her father; Skin cancer in her father; Varicose Veins in her mother.  Laboratory Chemistry Profile  Renal Lab Results  Component Value Date   BUN 31 (H) 07/30/2021   CREATININE 1.26 (H) 07/30/2021   LABCREA 48 08/22/2020   BCR 27 06/20/2021   GFR 46.13 (L) 10/15/2020   GFRAA 58 (L) 12/25/2019   GFRNONAA 46 (L) 07/30/2021    Hepatic Lab Results  Component Value Date   AST 14 (L) 03/25/2021   ALT 9 03/25/2021   ALBUMIN 3.2 (L) 03/25/2021   ALKPHOS 94 03/25/2021   LIPASE 27 07/28/2018    AMMONIA <9 (L) 02/07/2020    Electrolytes Lab Results  Component Value Date   NA 132 (L) 07/30/2021   K 4.3 07/30/2021   CL 95 (L) 07/30/2021   CALCIUM 8.7 (L) 07/30/2021   MG 1.9 06/08/2021   PHOS 4.0 06/08/2021    Bone Lab Results  Component Value Date   VD25OH 35.97 08/22/2020    Inflammation (CRP: Acute Phase) (ESR: Chronic Phase) Lab Results  Component Value Date   CRP 3.1 (H) 04/08/2020   ESRSEDRATE 60 (H) 04/08/2020   LATICACIDVEN 1.1 12/20/2019         Note: Above Lab results reviewed.  Recent Imaging Review  CT ABDOMEN PELVIS WO CONTRAST CLINICAL DATA:  Abdominal pain, acute, nonlocalized  EXAM: CT ABDOMEN AND PELVIS WITHOUT CONTRAST  TECHNIQUE: Multidetector CT imaging of the abdomen and pelvis was performed following the standard protocol without IV contrast.  RADIATION DOSE REDUCTION: This exam was performed according to the departmental dose-optimization program which includes automated exposure control, adjustment of the mA and/or kV according to patient size and/or use of iterative reconstruction technique.  COMPARISON:  02/07/2020, 08/12/2017  FINDINGS: Lower chest: Mild right basilar atelectasis. Cardiac size within normal limits. Central pulmonary arteries are enlarged in keeping with changes of pulmonary arterial hypertension. Small hiatal hernia.  Hepatobiliary: No focal liver abnormality is seen. Status post cholecystectomy. No biliary dilatation.  Pancreas: Unremarkable  Spleen: Unremarkable  Adrenals/Urinary Tract: Bilateral macroscopic fat containing adrenal masses are again identified in keeping with bilateral adrenal myelolipoma. Since remote prior examination, these demonstrate interval increase in size, now measuring 9.4 cm on the left and 9.3 cm in greatest dimension on the right. The kidneys are normal in size and position. No hydronephrosis. Nonobstructing 6 mm calculus within the left renal pelvis. No additional renal or  ureteral calculi. Bladder unremarkable.  Stomach/Bowel: Surgical changes of gastric sleeve resection are identified. The stomach, small bowel, and large bowel are otherwise unremarkable. Appendix normal. No free intraperitoneal gas or fluid.  Vascular/Lymphatic: No significant vascular findings are present. No enlarged abdominal or pelvic lymph nodes.  Reproductive: Status post hysterectomy. No adnexal masses.  Other: Moderate subcutaneous infiltration is seen within the pannus and flanks bilaterally, most commonly reflecting subcutaneous edema. No abdominal wall hernia.  Musculoskeletal: No acute bone abnormality. Degenerative changes are seen within the thoracolumbar spine. Stable T12 mild compression deformity.  IMPRESSION: No acute intra-abdominal pathology identified. No definite radiographic explanation for the patient's reported symptoms.  Bilateral benign adrenal myelolipomas again identified demonstrating mild interval increase in size since prior examination.  Status post gastric sleeve resection.  Small hiatal hernia.  Electronically Signed   By: Fidela Salisbury M.D.   On: 06/06/2021 21:54 DG Chest 2 View CLINICAL DATA:  Shortness of breath.  EXAM: CHEST - 2 VIEW  COMPARISON:  04/11/2021  FINDINGS: Stable enlargement of the cardiopericardial silhouette. Asymmetric elevation of the right hemidiaphragm is unchanged. No pulmonary edema or pleural effusion. Insert no consolidation status post right shoulder replacement  IMPRESSION: Stable exam. No acute cardiopulmonary findings.  Electronically Signed   By: Misty Stanley M.D.   On: 06/06/2021 18:40 Note: Reviewed        Physical Exam  General appearance: Well nourished, well developed, and well hydrated. In no apparent acute distress Mental status: Alert, oriented x 3 (person, place, & time)       Respiratory: No evidence of acute respiratory distress Eyes: PERLA Vitals: BP (!) 143/62   Pulse 80    Temp (!) 97.3 F (36.3 C)   Ht 5' 6" (1.676 m)   Wt 259 lb (117.5 kg)   SpO2 98%   BMI 41.80 kg/m  BMI: Estimated body mass index is 41.8 kg/m as calculated from the following:   Height as of this encounter: 5' 6" (1.676 m).   Weight as of this encounter: 259 lb (117.5 kg). Ideal: Ideal body weight: 59.3 kg (130 lb 11.7 oz) Adjusted ideal body weight: 82.6 kg (182 lb 0.6 oz)  Assessment   Diagnosis Status  1. Chronic pain syndrome   2. Osteoarthritis involving multiple joints   3. Chronic shoulder pain (Bilateral)   4. Meralgia paresthetica (Right)   5. Fibromyalgia syndrome   6. Left hemiparesis ([place])   7. Pharmacologic therapy   8. Chronic use of opiate for therapeutic purpose   9. Encounter for medication management   10. Encounter for chronic pain management   11. Abnormal MRI, shoulder    Controlled Controlled Controlled   Updated Problems: Problem  Abnormal Mri, Shoulder   IMPRESSION: 1. Severe osteoarthritis of the left glenohumeral joint. 2. Moderate tendinosis of the supraspinatus tendon with a small partial-thickness articular surface tear.     Plan of Care  Problem-specific:  No problem-specific Assessment & Plan notes found for this encounter.  [name] has a current medication list which includes the following long-term medication(s): albuterol, bupropion, calcium-vitamin d, colchicine, duloxetine, eliquis, furosemide, gemtesa, [START ON 11/05/2021] hydrocodone-acetaminophen, [START ON 12/05/2021] hydrocodone-acetaminophen, [START ON 01/04/2022] hydrocodone-acetaminophen, levocetirizine, levothyroxine, losartan, metformin, metoprolol succinate, montelukast, pantoprazole, pregabalin, rosuvastatin, and sitagliptin.  Pharmacotherapy (Medications Ordered): Meds ordered this encounter  Medications   HYDROcodone-acetaminophen (NORCO/VICODIN) 5-325 MG tablet    Sig: Take 1 tablet by mouth 2 (two) times daily as needed for severe pain. Must last 30  days    Dispense:  45 tablet    Refill:  0    DO NOT: delete (not duplicate); no partial-fill (will deny script to complete), no refill request (F/U required). DISPENSE: 1 day early if closed on fill date. WARN: No CNS-depressants within 8 hrs of med.   HYDROcodone-acetaminophen (NORCO/VICODIN) 5-325 MG tablet    Sig: Take 1 tablet by mouth 2 (two) times daily as needed for severe pain. Must last 30 days    Dispense:  45 tablet    Refill:  0    DO NOT: delete (not duplicate); no partial-fill (will deny script to complete), no refill request (F/U required). DISPENSE: 1 day early if closed on fill date. WARN: No CNS-depressants within 8 hrs of med.   HYDROcodone-acetaminophen (NORCO/VICODIN) 5-325 MG tablet    Sig: Take 1 tablet by mouth 2 (two) times daily as needed for severe pain. Must last 30 days    Dispense:  45 tablet    Refill:  0    DO NOT: delete (not duplicate); no partial-fill (will deny script to complete), no refill request (F/U required). DISPENSE: 1 day early if closed on fill date. WARN: No CNS-depressants within  8 hrs of med.   Orders:  Orders Placed This Encounter  Procedures   ToxASSURE Select 13 (MW), Urine    Volume: 30 ml(s). Minimum 3 ml of urine is needed. Document temperature of fresh sample. Indications: Long term (current) use of opiate analgesic (S28.768)    Order Specific Question:   Release to patient    Answer:   Immediate   Follow-up plan:   Return in about 3 months (around 02/03/2022) for Eval-day (M,W), (F2F), (MM).     Interventional Therapies  Risk  Complexity Considerations:   Estimated body mass index is 41.79 kg/m as calculated from the following:   Height as of this encounter: 5' 5.5" (1.664 m).   Weight as of this encounter: 255 lb (115.7 kg). NOTE: Eliquis ANTICOAGULATION (Stop: 3 days  Restart: 6 hours) NO LUMBAR RFA until BMI < 35.   Planned  Pending:      Under consideration:   Diagnostic left suprascapular NB   Completed:    None since before 2017   Completed by other providers:   Therapeutic left IA shoulder joint injection x1 (May 2023) by Dr. Thalia Party [facility])    Therapeutic  Palliative (PRN) options:   None at this time    Recent Visits No visits were found meeting these conditions. Showing recent visits within past 90 days and meeting all other requirements Today's Visits Date Type Provider Dept  10/29/21 Office Visit Milinda Pointer, MD [facility]  Showing today's visits and meeting all other requirements Future Appointments No visits were found meeting these conditions. Showing future appointments within next 90 days and meeting all other requirements  I discussed the assessment and treatment plan with the patient. The patient was provided an opportunity to ask questions and all were answered. The patient agreed with the plan and demonstrated an understanding of the instructions.  Patient advised to call back or seek an in-person evaluation if the symptoms or condition worsens.  Duration of encounter: 30 minutes.  Total time on encounter, as per AMA guidelines included both the face-to-face and non-face-to-face time personally spent by the physician and/or other qualified health care professional(s) on the day of the encounter (includes time in activities that require the physician or other qualified health care professional and does not include time in activities normally performed by clinical staff). Physician's time may include the following activities when performed: preparing to see the patient (eg, review of tests, pre-charting review of records) obtaining and/or reviewing separately obtained history performing a medically appropriate examination and/or evaluation counseling and educating the patient/family/caregiver ordering medications, tests, or procedures referring and communicating with other health care professionals (when not separately  reported) documenting clinical information in the electronic or other health record independently interpreting results (not separately reported) and communicating results to the patient/ family/caregiver care coordination (not separately reported)  Note by: Gaspar Cola, MD Date: 10/29/2021; Time: 1:56 PM

## 2021-10-28 NOTE — Patient Instructions (Signed)
____________________________________________________________________________________________  Pharmacy Shortages of Pain Medication   Introduction Shockingly as it may seem, .  "No U.[place] Supreme Court decision has ever interpreted the Constitution as guaranteeing a right to health care for all Americans." - https://www.healthequityandpolicylab.com/elusive-right-to-health-care-under-us-law  "With respect to human rights, the [place] has no formally codified right to health, nor does it participate in a human rights treaty that specifies a right to health." - Scott J. Schweikart, JD, MBE  Situation By now, most of our patients have had the experience of being told by their pharmacist that they do not have enough medication to cover their prescription. If you have not had this experience, just know that you soon will.  Problem There appears to be a shortage of these medications, either at the national level or locally. This is happening with all pharmacies. When there is not enough medication, patients are offered a partial fill and they are told that they will try to get the rest of the medicine for them at a later time. If they do not have enough for even a partial fill, the pharmacists are telling the patients to call us (the prescribing physicians) to request that we send another prescription to another pharmacy to get the medicine.   This reordering of a controlled substance creates documentation problems where additional paperwork needs to be created to explain why two prescriptions for the same period of time and the same medicine are being prescribed to the same patient. It also creates situations where the last appointment note does not accurately reflect when and what prescriptions were given to a patient. This leads to prescribing errors down the line, in subsequent follow-up visits.   [place] Board of Pharmacy ([place]BOP) Research revealed that Board of Pharmacy Rule .1806 (21  [place]AC 46.1806) authorizes pharmacists to the transfer of prescriptions among pharmacies, and it sets forth procedural and recordkeeping requirements for doing so. However, this requires the pharmacist to complete the previously mentioned procedural paperwork to accomplish the transfer. As it turns out, it is much easier for them to have the prescribing physicians do the work.   Possible solutions 1. Have the [place] Assembly add a provision to the "STOP ACT" (the law that mandates how controlled substances are prescribed) where there is an exception to the electronic prescribing rule that states that in the event there are shortages of medications the physicians are allowed to use written prescriptions as opposed to electronic ones. This would allow patients to take their prescriptions to a different pharmacy that may have enough medication available to fill the prescription. The problem is that currently there is a law that does not allow for written prescriptions, with the exception of instances where the electronic medical record is down due to technical issues.  2. Have [place] Congress ease the pressure on pharmaceutical companies, allowing them to produce enough quantities of the medication to adequately supply the population. 3. Have pharmacies keep enough stocks of these medications to cover their client base.  4. Have the [place] Assembly add a provision to the "STOP ACT" where they ease the regulations surrounding the transfer of controlled substances between pharmacies, so as to simplify the transfer of supplies. As an alternative, develop a system to allow patients to obtain the remainder of their prescription at another one of their pharmacies or at an associate pharmacy.   How this shortage will affect you.  The one thing that is abundantly clear is that this is a pharmacy supply   problem  and not a prescriber problem. The job of the prescriber is to evaluate and monitor the  patients for the appropriate indications to the use of these medicines, the monitoring of their use and the prescribing of the appropriate dose and regimen. It is not the job of the prescriber to provide or dispense the actual medication. By law, this is the job of the pharmacies and pharmacists. It is certainly not the job of the prescriber to solve the supply problems.   Due to the above problems we are no longer taking patients to write for their pain medication. We will continue to evaluate for appropriate indications and we may provide recommendations regarding medication, dose, and schedule, as well as monitoring recommendations, however, we will not be taking over the actual prescribing of these substances. On those patients where we are treating their chronic pain with interventional therapies, exceptions will be considered on a case by case basis. At this time, we will try to continue providing this supplemental service to those patients we have been managing in the past. However, as of August 1st, 2023, we no longer will be sending additional prescriptions to other pharmacies for the purpose of solving their supply problems. Once we send a prescription to a pharmacy, we will not be resending it again to another pharmacy to cover for their shortages.   What to do. Write as many letters as you can. Recruit the help of family members in writing these letters. Below are some of the places where you can write to make your voice heard. Let them know what the problem is and push them to look for solutions.   Search internet for: "[place] find your legislators" https://www.ncleg.gov/findyourlegislators  Search internet for: "[place] insurance commissioner complaints" https://www.ncdoi.gov/contactscomplaints/assistance-or-file-complaint  Search internet for: "[place] Board of Pharmacy complaints" http://www.ncbop.org/contact.htm  Search internet for: "[facility] pharmacy  complaints" Email [facility] Customer Relations https://www.[facility].com/help/email-customer-relations.jsp?callType=store  Search internet for: "[facility] pharmacy customer service complaints" https://www.[facility].com/topic/marketing/contactus/contactus_customerservice.jsp  ____________________________________________________________________________________________  ____________________________________________________________________________________________  Medication Rules  Purpose: To inform patients, and their family members, of our rules and regulations.  Applies to: All patients receiving prescriptions (written or electronic).  Pharmacy of record: Pharmacy where electronic prescriptions will be sent. If written prescriptions are taken to a different pharmacy, please inform the nursing staff. The pharmacy listed in the electronic medical record should be the one where you would like electronic prescriptions to be sent.  Electronic prescriptions: In compliance with the [place]reng[place] Opioid Misuse Prevention (STOP) Act of 2017 (Session Law 2017-74/H243), effective March 23, 2018, all controlled substances must be electronically prescribed. Calling prescriptions to the pharmacy will cease to exist.  Prescription refills: Only during scheduled appointments. Applies to all prescriptions.  NOTE: The following applies primarily to controlled substances (Opioid* Pain Medications).   Type of encounter (visit): For patients receiving controlled substances, face-to-face visits are required. (Not an option or up to the patient.)  Patient's responsibilities: Pain Pills: Bring all pain pills to every appointment (except for procedure appointments). Pill Bottles: Bring pills in original pharmacy bottle. Always bring the newest bottle. Bring bottle, even if empty. Medication refills: You are responsible for knowing and keeping track of what medications you take and those you need  refilled. The day before your appointment: write a list of all prescriptions that need to be refilled. The day of the appointment: give the list to the admitting nurse. Prescriptions will be written only during appointments. No prescriptions will be written on procedure days.   If you forget a medication: it will not be "Called in", "Faxed", or "electronically sent". You will need to get another appointment to get these prescribed. No early refills. Do not call asking to have your prescription filled early. Prescription Accuracy: You are responsible for carefully inspecting your prescriptions before leaving our office. Have the discharge nurse carefully go over each prescription with you, before taking them home. Make sure that your name is accurately spelled, that your address is correct. Check the name and dose of your medication to make sure it is accurate. Check the number of pills, and the written instructions to make sure they are clear and accurate. Make sure that you are given enough medication to last until your next medication refill appointment. Taking Medication: Take medication as prescribed. When it comes to controlled substances, taking less pills or less frequently than prescribed is permitted and encouraged. Never take more pills than instructed. Never take medication more frequently than prescribed.  Inform other Doctors: Always inform, all of your healthcare providers, of all the medications you take. Pain Medication from other Providers: You are not allowed to accept any additional pain medication from any other Doctor or Healthcare provider. There are two exceptions to this rule. (see below) In the event that you require additional pain medication, you are responsible for notifying us, as stated below. Cough Medicine: Often these contain an opioid, such as codeine or hydrocodone. Never accept or take cough medicine containing these opioids if you are already taking an opioid*  medication. The combination may cause respiratory failure and death. Medication Agreement: You are responsible for carefully reading and following our Medication Agreement. This must be signed before receiving any prescriptions from our practice. Safely store a copy of your signed Agreement. Violations to the Agreement will result in no further prescriptions. (Additional copies of our Medication Agreement are available upon request.) Laws, Rules, & Regulations: All patients are expected to follow all Federal and State Laws, Statutes, Rules, & Regulations. Ignorance of the Laws does not constitute a valid excuse.  Illegal drugs and Controlled Substances: The use of illegal substances (including, but not limited to marijuana and its derivatives) and/or the illegal use of any controlled substances is strictly prohibited. Violation of this rule may result in the immediate and permanent discontinuation of any and all prescriptions being written by our practice. The use of any illegal substances is prohibited. Adopted CDC guidelines & recommendations: Target dosing levels will be at or below 60 MME/day. Use of benzodiazepines** is not recommended.  Exceptions: There are only two exceptions to the rule of not receiving pain medications from other Healthcare Providers. Exception #1 (Emergencies): In the event of an emergency (i.e.: accident requiring emergency care), you are allowed to receive additional pain medication. However, you are responsible for: As soon as you are able, call our office [phone], at any time of the day or night, and leave a message stating your name, the date and nature of the emergency, and the name and dose of the medication prescribed. In the event that your call is answered by a member of our staff, make sure to document and save the date, time, and the name of the person that took your information.  Exception #2 (Planned Surgery): In the event that you are scheduled by another  doctor or dentist to have any type of surgery or procedure, you are allowed (for a period no longer than 30 days), to receive additional pain medication, for the acute post-op pain.   However, in this case, you are responsible for picking up a copy of our "Post-op Pain Management for Surgeons" handout, and giving it to your surgeon or dentist. This document is available at our office, and does not require an appointment to obtain it. Simply go to our office during business hours (Monday-Thursday from 8:00 AM to 4:00 PM) (Friday 8:00 AM to 12:00 Noon) or if you have a scheduled appointment with us, prior to your surgery, and ask for it by name. In addition, you are responsible for: calling our office [phone], at any time of the day or night, and leaving a message stating your name, name of your surgeon, type of surgery, and date of procedure or surgery. Failure to comply with your responsibilities may result in termination of therapy involving the controlled substances. Medication Agreement Violation. Following the above rules, including your responsibilities will help you in avoiding a Medication Agreement Violation ("Breaking your Pain Medication Contract").  *Opioid medications include: morphine, codeine, oxycodone, oxymorphone, hydrocodone, hydromorphone, meperidine, tramadol, tapentadol, buprenorphine, fentanyl, methadone. **Benzodiazepine medications include: diazepam (Valium), alprazolam (Xanax), clonazepam (Klonopine), lorazepam (Ativan), clorazepate (Tranxene), chlordiazepoxide (Librium), estazolam (Prosom), oxazepam (Serax), temazepam (Restoril), triazolam (Halcion) (Last updated: 12/18/2020) ____________________________________________________________________________________________  ____________________________________________________________________________________________  Medication Recommendations and Reminders  Applies to: All patients receiving prescriptions (written and/or  electronic).  Medication Rules & Regulations: These rules and regulations exist for your safety and that of others. They are not flexible and neither are we. Dismissing or ignoring them will be considered "non-compliance" with medication therapy, resulting in complete and irreversible termination of such therapy. (See document titled "Medication Rules" for more details.) In all conscience, because of safety reasons, we cannot continue providing a therapy where the patient does not follow instructions.  Pharmacy of record:  Definition: This is the pharmacy where your electronic prescriptions will be sent.  We do not endorse any particular pharmacy, however, we have experienced problems with [facility] not securing enough medication supply for the community. We do not restrict you in your choice of pharmacy. However, once we write for your prescriptions, we will NOT be re-sending more prescriptions to fix restricted supply problems created by your pharmacy, or your insurance.  The pharmacy listed in the electronic medical record should be the one where you want electronic prescriptions to be sent. If you choose to change pharmacy, simply notify our nursing staff.  Recommendations: Keep all of your pain medications in a safe place, under lock and key, even if you live alone. We will NOT replace lost, stolen, or damaged medication. After you fill your prescription, take 1 week's worth of pills and put them away in a safe place. You should keep a separate, properly labeled bottle for this purpose. The remainder should be kept in the original bottle. Use this as your primary supply, until it runs out. Once it's gone, then you know that you have 1 week's worth of medicine, and it is time to come in for a prescription refill. If you do this correctly, it is unlikely that you will ever run out of medicine. To make sure that the above recommendation works, it is very important that you make sure your medication  refill appointments are scheduled at least 1 week before you run out of medicine. To do this in an effective manner, make sure that you do not leave the office without scheduling your next medication management appointment. Always ask the nursing staff to show you in your prescription , when your medication will be running out.   Then arrange for the receptionist to get you a return appointment, at least 7 days before you run out of medicine. Do not wait until you have 1 or 2 pills left, to come in. This is very poor planning and does not take into consideration that we may need to cancel appointments due to bad weather, sickness, or emergencies affecting our staff. DO NOT ACCEPT A "Partial Fill": If for any reason your pharmacy does not have enough pills/tablets to completely fill or refill your prescription, do not allow for a "partial fill". The law allows the pharmacy to complete that prescription within 72 hours, without requiring a new prescription. If they do not fill the rest of your prescription within those 72 hours, you will need a separate prescription to fill the remaining amount, which we will NOT provide. If the reason for the partial fill is your insurance, you will need to talk to the pharmacist about payment alternatives for the remaining tablets, but again, DO NOT ACCEPT A PARTIAL FILL, unless you can trust your pharmacist to obtain the remainder of the pills within 72 hours.  Prescription refills and/or changes in medication(s):  Prescription refills, and/or changes in dose or medication, will be conducted only during scheduled medication management appointments. (Applies to both, written and electronic prescriptions.) No refills on procedure days. No medication will be changed or started on procedure days. No changes, adjustments, and/or refills will be conducted on a procedure day. Doing so will interfere with the diagnostic portion of the procedure. No phone refills. No medications will be  "called into the pharmacy". No Fax refills. No weekend refills. No Holliday refills. No after hours refills.  Remember:  Business hours are:  Monday to Thursday 8:00 AM to 4:00 PM Provider's Schedule: Traveion Ruddock, MD - Appointments are:  Medication management: Monday and Wednesday 8:00 AM to 4:00 PM Procedure day: Tuesday and Thursday 7:30 AM to 4:00 PM Bilal Lateef, MD - Appointments are:  Medication management: Tuesday and Thursday 8:00 AM to 4:00 PM Procedure day: Monday and Wednesday 7:30 AM to 4:00 PM (Last update: 10/11/2019) ____________________________________________________________________________________________  ____________________________________________________________________________________________  CBD (cannabidiol) & Delta-8 (Delta-8 tetrahydrocannabinol) WARNING  Intro: Cannabidiol (CBD) and tetrahydrocannabinol (THC), are two natural compounds found in plants of the Cannabis genus. They can both be extracted from hemp or cannabis. Hemp and cannabis come from the Cannabis sativa plant. Both compounds interact with your body's endocannabinoid system, but they have very different effects. CBD does not produce the high sensation associated with cannabis. Delta-8 tetrahydrocannabinol, also known as delta-8 THC, is a psychoactive substance found in the Cannabis sativa plant, of which marijuana and hemp are two varieties. THC is responsible for the high associated with the illicit use of marijuana.  Applicable to: All individuals currently taking or considering taking CBD (cannabidiol) and, more important, all patients taking opioid analgesic controlled substances (pain medication). (Example: oxycodone; oxymorphone; hydrocodone; hydromorphone; morphine; methadone; tramadol; tapentadol; fentanyl; buprenorphine; butorphanol; dextromethorphan; meperidine; codeine; etc.)  Legal status: CBD remains a Schedule I drug prohibited for any use. CBD is illegal with one exception.  In the [place], CBD has a limited Food and Drug Administration (FDA) approval for the treatment of two specific types of epilepsy disorders. Only one CBD product has been approved by the FDA for this purpose: "Epidiolex". FDA is aware that some companies are marketing products containing cannabis and cannabis-derived compounds in ways that violate the Federal Food, Drug and Cosmetic Act (FD&C Act) and that may put the health and   safety of consumers at risk. The FDA, a Federal agency, has not enforced the CBD status since 2018. UPDATE: (05/09/2021) The Drug Enforcement Agency (DEA) issued a letter stating that "delta" cannabinoids, including Delta-8-THCO and Delta-9-THCO, synthetically derived from hemp do not qualify as hemp and will be viewed as Schedule I drugs. (Schedule I drugs, substances, or chemicals are defined as drugs with no currently accepted medical use and a high potential for abuse. Some examples of Schedule I drugs are: heroin, lysergic acid diethylamide (LSD), marijuana (cannabis), 3,4-methylenedioxymethamphetamine (ecstasy), methaqualone, and peyote.) (https://www.dea.gov)  Legality: Some manufacturers ship CBD products nationally, which is illegal. Often such products are sold online and are therefore available throughout the country. CBD is openly sold in head shops and health food stores in some states where such sales have not been explicitly legalized. Selling unapproved products with unsubstantiated therapeutic claims is not only a violation of the law, but also can put patients at risk, as these products have not been proven to be safe or effective. Federal illegality makes it difficult to conduct research on CBD.  Reference: "FDA Regulation of Cannabis and Cannabis-Derived Products, Including Cannabidiol (CBD)" - https://www.fda.gov/news-events/public-health-focus/fda-regulation-cannabis-and-cannabis-derived-products-including-cannabidiol-cbd  Warning: CBD is not FDA approved  and has not undergo the same manufacturing controls as prescription drugs.  This means that the purity and safety of available CBD may be questionable. Most of the time, despite manufacturer's claims, it is contaminated with THC (delta-9-tetrahydrocannabinol - the chemical in marijuana responsible for the "HIGH").  When this is the case, the THC contaminant will trigger a positive urine drug screen (UDS) test for Marijuana (carboxy-THC). Because a positive UDS for any illicit substance is a violation of our medication agreement, your opioid analgesics (pain medicine) may be permanently discontinued. The FDA recently put out a warning about 5 things that everyone should be aware of regarding Delta-8 THC: Delta-8 THC products have not been evaluated or approved by the FDA for safe use and may be marketed in ways that put the public health at risk. The FDA has received adverse event reports involving delta-8 THC-containing products. Delta-8 THC has psychoactive and intoxicating effects. Delta-8 THC manufacturing often involve use of potentially harmful chemicals to create the concentrations of delta-8 THC claimed in the marketplace. The final delta-8 THC product may have potentially harmful by-products (contaminants) due to the chemicals used in the process. Manufacturing of delta-8 THC products may occur in uncontrolled or unsanitary settings, which may lead to the presence of unsafe contaminants or other potentially harmful substances. Delta-8 THC products should be kept out of the reach of children and pets.  MORE ABOUT CBD  General Information: CBD was discovered in 1940 and it is a derivative of the cannabis sativa genus plants (Marijuana and Hemp). It is one of the 113 identified substances found in Marijuana. It accounts for up to 40% of the plant's extract. As of 2018, preliminary clinical studies on CBD included research for the treatment of anxiety, movement disorders, and pain. CBD is available and  consumed in multiple forms, including inhalation of smoke or vapor, as an aerosol spray, and by mouth. It may be supplied as an oil containing CBD, capsules, dried cannabis, or as a liquid solution. CBD is thought not to be as psychoactive as THC (delta-9-tetrahydrocannabinol - the chemical in marijuana responsible for the "HIGH"). Studies suggest that CBD may interact with different biological target receptors in the body, including cannabinoid and other neurotransmitter receptors. As of 2018 the mechanism of action for its biological effects   has not been determined.  Side-effects  Adverse reactions: Dry mouth, diarrhea, decreased appetite, fatigue, drowsiness, malaise, weakness, sleep disturbances, and others.  Drug interactions: CBC may interact with other medications such as blood-thinners. Because CBD causes drowsiness on its own, it also increases the drowsiness caused by other medications, including antihistamines (such as Benadryl), benzodiazepines (Xanax, Ativan, Valium), antipsychotics, antidepressants and opioids, as well as alcohol and supplements such as kava, melatonin and St. John's Wort. Be cautious with the following combinations:   Brivaracetam (Briviact) Brivaracetam is changed and broken down by the body. CBD might decrease how quickly the body breaks down brivaracetam. This might increase levels of brivaracetam in the body.  Caffeine Caffeine is changed and broken down by the body. CBD might decrease how quickly the body breaks down caffeine. This might increase levels of caffeine in the body.  Carbamazepine (Tegretol) Carbamazepine is changed and broken down by the body. CBD might decrease how quickly the body breaks down carbamazepine. This might increase levels of carbamazepine in the body and increase its side effects.  Citalopram (Celexa) Citalopram is changed and broken down by the body. CBD might decrease how quickly the body breaks down citalopram. This might increase  levels of citalopram in the body and increase its side effects.  Clobazam (Onfi) Clobazam is changed and broken down by the liver. CBD might decrease how quickly the liver breaks down clobazam. This might increase the effects and side effects of clobazam.  Eslicarbazepine (Aptiom) Eslicarbazepine is changed and broken down by the body. CBD might decrease how quickly the body breaks down eslicarbazepine. This might increase levels of eslicarbazepine in the body by a small amount.  Everolimus (Zostress) Everolimus is changed and broken down by the body. CBD might decrease how quickly the body breaks down everolimus. This might increase levels of everolimus in the body.  Lithium Taking higher doses of CBD might increase levels of lithium. This can increase the risk of lithium toxicity.  Medications changed by the liver (Cytochrome P450 1A1 (CYP1A1) substrates) Some medications are changed and broken down by the liver. CBD might change how quickly the liver breaks down these medications. This could change the effects and side effects of these medications.  Medications changed by the liver (Cytochrome P450 1A2 (CYP1A2) substrates) Some medications are changed and broken down by the liver. CBD might change how quickly the liver breaks down these medications. This could change the effects and side effects of these medications.  Medications changed by the liver (Cytochrome P450 1B1 (CYP1B1) substrates) Some medications are changed and broken down by the liver. CBD might change how quickly the liver breaks down these medications. This could change the effects and side effects of these medications.  Medications changed by the liver (Cytochrome P450 2A6 (CYP2A6) substrates) Some medications are changed and broken down by the liver. CBD might change how quickly the liver breaks down these medications. This could change the effects and side effects of these medications.  Medications changed by the liver  (Cytochrome P450 2B6 (CYP2B6) substrates) Some medications are changed and broken down by the liver. CBD might change how quickly the liver breaks down these medications. This could change the effects and side effects of these medications.  Medications changed by the liver (Cytochrome P450 2C19 (CYP2C19) substrates) Some medications are changed and broken down by the liver. CBD might change how quickly the liver breaks down these medications. This could change the effects and side effects of these medications.  Medications changed by the   liver (Cytochrome P450 2C8 (CYP2C8) substrates) Some medications are changed and broken down by the liver. CBD might change how quickly the liver breaks down these medications. This could change the effects and side effects of these medications.  Medications changed by the liver (Cytochrome P450 2C9 (CYP2C9) substrates) Some medications are changed and broken down by the liver. CBD might change how quickly the liver breaks down these medications. This could change the effects and side effects of these medications.  Medications changed by the liver (Cytochrome P450 2D6 (CYP2D6) substrates) Some medications are changed and broken down by the liver. CBD might change how quickly the liver breaks down these medications. This could change the effects and side effects of these medications.  Medications changed by the liver (Cytochrome P450 2E1 (CYP2E1) substrates) Some medications are changed and broken down by the liver. CBD might change how quickly the liver breaks down these medications. This could change the effects and side effects of these medications.  Medications changed by the liver (Cytochrome P450 3A4 (CYP3A4) substrates) Some medications are changed and broken down by the liver. CBD might change how quickly the liver breaks down these medications. This could change the effects and side effects of these medications.  Medications changed by the liver  (Glucuronidated drugs) Some medications are changed and broken down by the liver. CBD might change how quickly the liver breaks down these medications. This could change the effects and side effects of these medications.  Medications that decrease the breakdown of other medications by the liver (Cytochrome P450 2C19 (CYP2C19) inhibitors) CBD is changed and broken down by the liver. Some drugs decrease how quickly the liver changes and breaks down CBD. This could change the effects and side effects of CBD.  Medications that decrease the breakdown of other medications in the liver (Cytochrome P450 3A4 (CYP3A4) inhibitors) CBD is changed and broken down by the liver. Some drugs decrease how quickly the liver changes and breaks down CBD. This could change the effects and side effects of CBD.  Medications that increase breakdown of other medications by the liver (Cytochrome P450 3A4 (CYP3A4) inducers) CBD is changed and broken down by the liver. Some drugs increase how quickly the liver changes and breaks down CBD. This could change the effects and side effects of CBD.  Medications that increase the breakdown of other medications by the liver (Cytochrome P450 2C19 (CYP2C19) inducers) CBD is changed and broken down by the liver. Some drugs increase how quickly the liver changes and breaks down CBD. This could change the effects and side effects of CBD.  Methadone (Dolophine) Methadone is broken down by the liver. CBD might decrease how quickly the liver breaks down methadone. Taking cannabidiol along with methadone might increase the effects and side effects of methadone.  Rufinamide (Banzel) Rufinamide is changed and broken down by the body. CBD might decrease how quickly the body breaks down rufinamide. This might increase levels of rufinamide in the body by a small amount.  Sedative medications (CNS depressants) CBD might cause sleepiness and slowed breathing. Some medications, called sedatives,  can also cause sleepiness and slowed breathing. Taking CBD with sedative medications might cause breathing problems and/or too much sleepiness.  Sirolimus (Rapamune) Sirolimus is changed and broken down by the body. CBD might decrease how quickly the body breaks down sirolimus. This might increase levels of sirolimus in the body.  Stiripentol (Diacomit) Stiripentol is changed and broken down by the body. CBD might decrease how quickly the body breaks down   stiripentol. This might increase levels of stiripentol in the body and increase its side effects.  Tacrolimus (Prograf) Tacrolimus is changed and broken down by the body. CBD might decrease how quickly the body breaks down tacrolimus. This might increase levels of tacrolimus in the body.  Tamoxifen (Soltamox) Tamoxifen is changed and broken down by the body. CBD might affect how quickly the body breaks down tamoxifen. This might affect levels of tamoxifen in the body.  Topiramate (Topamax) Topiramate is changed and broken down by the body. CBD might decrease how quickly the body breaks down topiramate. This might increase levels of topiramate in the body by a small amount.  Valproate Valproic acid can cause liver injury. Taking cannabidiol with valproic acid might increase the chance of liver injury. CBD and/or valproic acid might need to be stopped, or the dose might need to be reduced.  Warfarin (Coumadin) CBD might increase levels of warfarin, which can increase the risk for bleeding. CBD and/or warfarin might need to be stopped, or the dose might need to be reduced.  Zonisamide Zonisamide is changed and broken down by the body. CBD might decrease how quickly the body breaks down zonisamide. This might increase levels of zonisamide in the body by a small amount. (Last update: 05/21/2021) ____________________________________________________________________________________________   ____________________________________________________________________________________________  Drug Holidays (Slow)  What is a "Drug Holiday"? Drug Holiday: is the name given to the period of time during which a patient stops taking a medication(s) for the purpose of eliminating tolerance to the drug.  Benefits Improved effectiveness of opioids. Decreased opioid dose needed to achieve benefits. Improved pain with lesser dose.  What is tolerance? Tolerance: is the progressive decreased in effectiveness of a drug due to its repetitive use. With repetitive use, the body gets use to the medication and as a consequence, it loses its effectiveness. This is a common problem seen with opioid pain medications. As a result, a larger dose of the drug is needed to achieve the same effect that used to be obtained with a smaller dose.  How long should a "Drug Holiday" last? You should stay off of the pain medicine for at least 14 consecutive days. (2 weeks)  Should I stop the medicine "cold turkey"? No. You should always coordinate with your Pain Specialist so that he/she can provide you with the correct medication dose to make the transition as smoothly as possible.  How do I stop the medicine? Slowly. You will be instructed to decrease the daily amount of pills that you take by one (1) pill every seven (7) days. This is called a "slow downward taper" of your dose. For example: if you normally take four (4) pills per day, you will be asked to drop this dose to three (3) pills per day for seven (7) days, then to two (2) pills per day for seven (7) days, then to one (1) per day for seven (7) days, and at the end of those last seven (7) days, this is when the "Drug Holiday" would start.   Will I have withdrawals? By doing a "slow downward taper" like this one, it is unlikely that you will experience any significant withdrawal symptoms. Typically, what triggers withdrawals is the sudden stop of a high dose  opioid therapy. Withdrawals can usually be avoided by slowly decreasing the dose over a prolonged period of time. If you do not follow these instructions and decide to stop your medication abruptly, withdrawals may be possible.  What are withdrawals? Withdrawals: refers to   the wide range of symptoms that occur after stopping or dramatically reducing opiate drugs after heavy and prolonged use. Withdrawal symptoms do not occur to patients that use low dose opioids, or those who take the medication sporadically. Contrary to benzodiazepine (example: Valium, Xanax, etc.) or alcohol withdrawals ("Delirium Tremens"), opioid withdrawals are not lethal. Withdrawals are the physical manifestation of the body getting rid of the excess receptors.  Expected Symptoms Early symptoms of withdrawal may include: Agitation Anxiety Muscle aches Increased tearing Insomnia Runny nose Sweating Yawning  Late symptoms of withdrawal may include: Abdominal cramping Diarrhea Dilated pupils Goose bumps Nausea Vomiting  Will I experience withdrawals? Due to the slow nature of the taper, it is very unlikely that you will experience any.  What is a slow taper? Taper: refers to the gradual decrease in dose.  (Last update: 10/11/2019) ____________________________________________________________________________________________    

## 2021-10-29 ENCOUNTER — Encounter: Payer: Self-pay | Admitting: Pain Medicine

## 2021-10-29 ENCOUNTER — Ambulatory Visit: Payer: Medicare HMO | Attending: Pain Medicine | Admitting: Pain Medicine

## 2021-10-29 VITALS — BP 143/62 | HR 80 | Temp 97.3°F | Ht 66.0 in | Wt 259.0 lb

## 2021-10-29 DIAGNOSIS — Z79891 Long term (current) use of opiate analgesic: Secondary | ICD-10-CM | POA: Insufficient documentation

## 2021-10-29 DIAGNOSIS — M25512 Pain in left shoulder: Secondary | ICD-10-CM | POA: Insufficient documentation

## 2021-10-29 DIAGNOSIS — R936 Abnormal findings on diagnostic imaging of limbs: Secondary | ICD-10-CM | POA: Insufficient documentation

## 2021-10-29 DIAGNOSIS — G8929 Other chronic pain: Secondary | ICD-10-CM | POA: Diagnosis not present

## 2021-10-29 DIAGNOSIS — G5711 Meralgia paresthetica, right lower limb: Secondary | ICD-10-CM | POA: Insufficient documentation

## 2021-10-29 DIAGNOSIS — G8194 Hemiplegia, unspecified affecting left nondominant side: Secondary | ICD-10-CM | POA: Diagnosis not present

## 2021-10-29 DIAGNOSIS — Z79899 Other long term (current) drug therapy: Secondary | ICD-10-CM | POA: Diagnosis not present

## 2021-10-29 DIAGNOSIS — M159 Polyosteoarthritis, unspecified: Secondary | ICD-10-CM | POA: Diagnosis not present

## 2021-10-29 DIAGNOSIS — M25511 Pain in right shoulder: Secondary | ICD-10-CM | POA: Diagnosis not present

## 2021-10-29 DIAGNOSIS — G894 Chronic pain syndrome: Secondary | ICD-10-CM | POA: Diagnosis not present

## 2021-10-29 DIAGNOSIS — M797 Fibromyalgia: Secondary | ICD-10-CM | POA: Insufficient documentation

## 2021-10-29 MED ORDER — HYDROCODONE-ACETAMINOPHEN 5-325 MG PO TABS: 1.0000 | ORAL_TABLET | Freq: Two times a day (BID) | ORAL | 0 refills | Status: DC | PRN

## 2021-10-29 NOTE — Progress Notes (Signed)
Nursing Pain Medication Assessment:  Safety precautions to be maintained throughout the outpatient stay will include: orient to surroundings, keep bed in low position, maintain call bell within reach at all times, provide assistance with transfer out of bed and ambulation.  Medication Inspection Compliance: Pill count conducted under aseptic conditions, in front of the patient. Neither the pills nor the bottle was removed from the patient's sight at any time. Once count was completed pills were immediately returned to the patient in their original bottle.  Medication: Hydrocodone/APAP Pill/Patch Count:  9 of 45 pills remain Pill/Patch Appearance: Markings consistent with prescribed medication Bottle Appearance: Standard pharmacy container. Clearly labeled. Filled Date: 7 / 7 / 2023 Last Medication intake:  TodaySafety precautions to be maintained throughout the outpatient stay will include: orient to surroundings, keep bed in low position, maintain call bell within reach at all times, provide assistance with transfer out of bed and ambulation.

## 2021-11-02 LAB — TOXASSURE SELECT 13 (MW), URINE

## 2021-11-03 IMAGING — MG DIGITAL SCREENING BILAT W/ TOMO W/ CAD
8 of 15 series · 8 of 40 positions shown · non-contrast
Comparison: Previous exam(s).

CLINICAL DATA: Screening.

EXAM:
DIGITAL SCREENING BILATERAL MAMMOGRAM WITH TOMO AND CAD

[L CC synth-2D (1 of 2)]
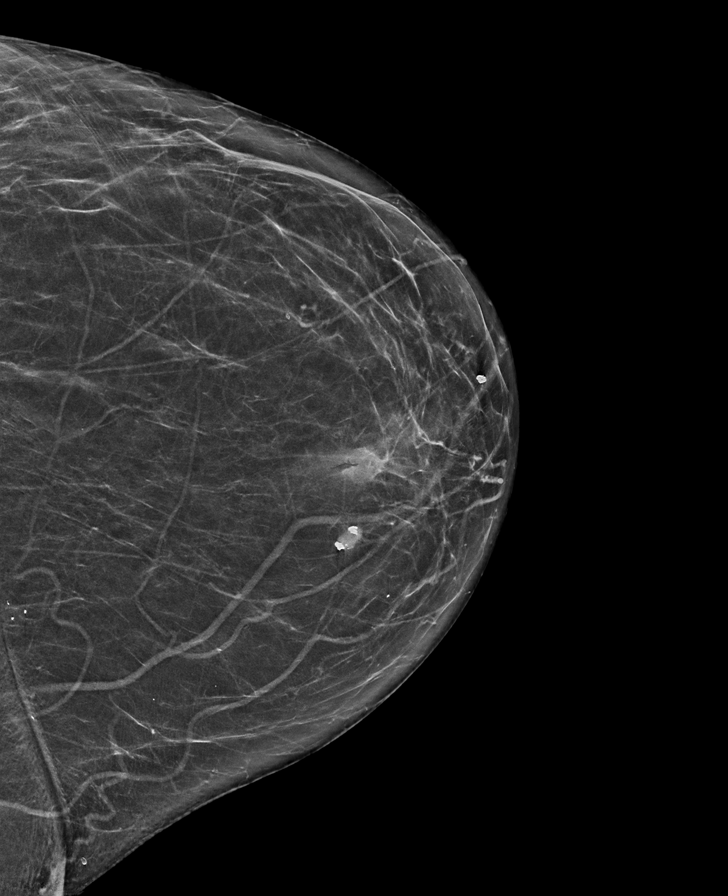

[L CC synth-2D (2 of 2)]
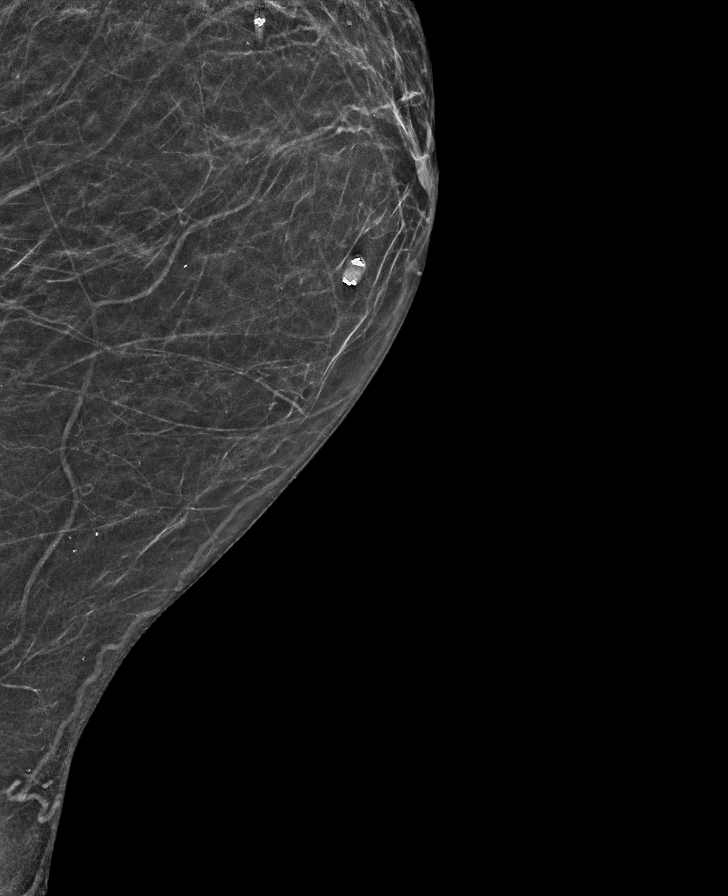

[R CC synth-2D (1 of 2)]
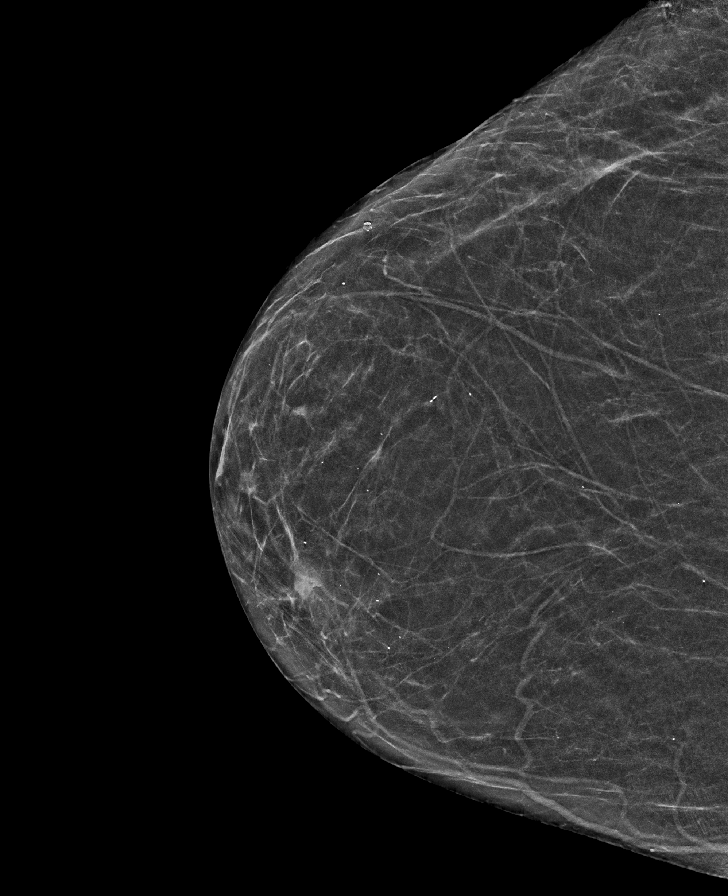

[R MLO synth-2D]
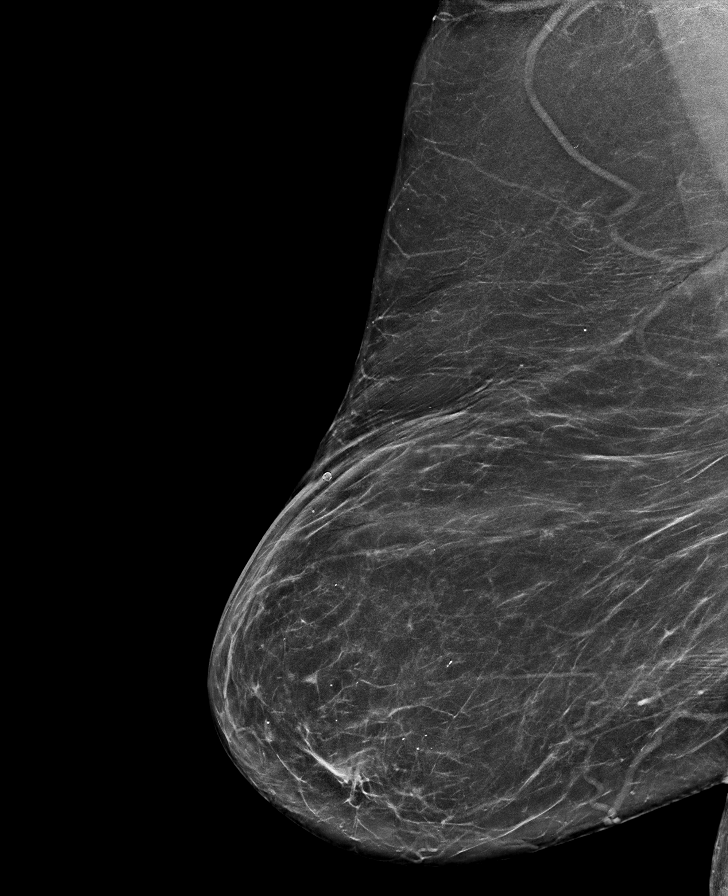

[L MLO synth-2D (1 of 2)]
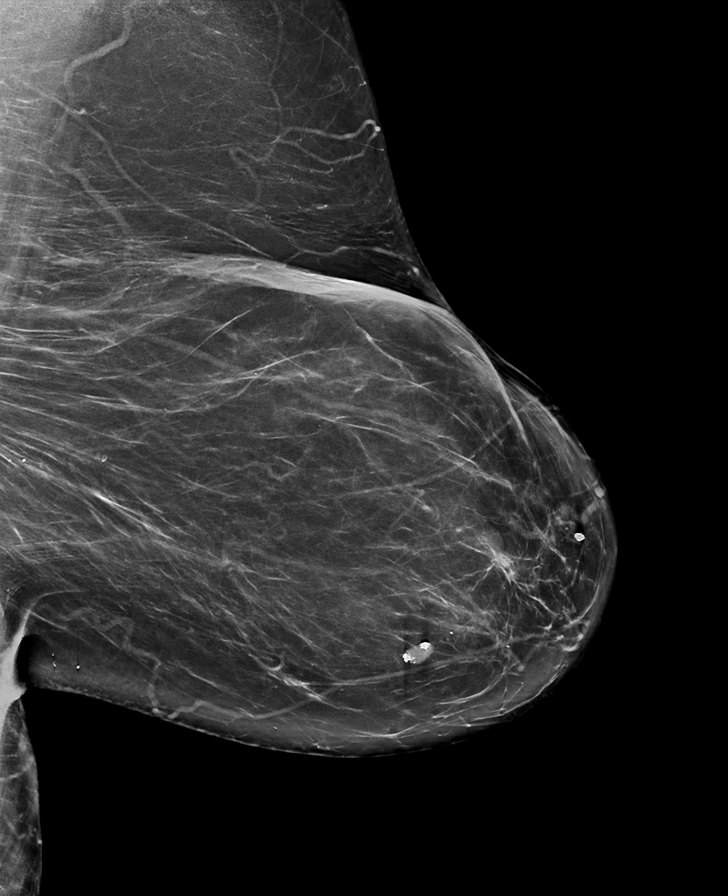

[L MLO synth-2D (2 of 2)]
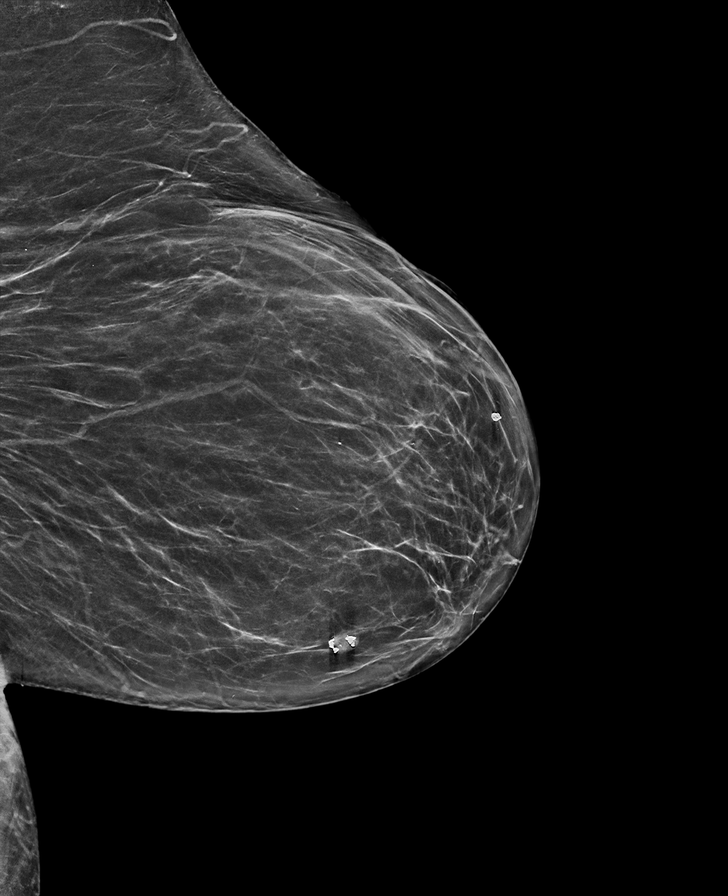

[R CC synth-2D (2 of 2)]
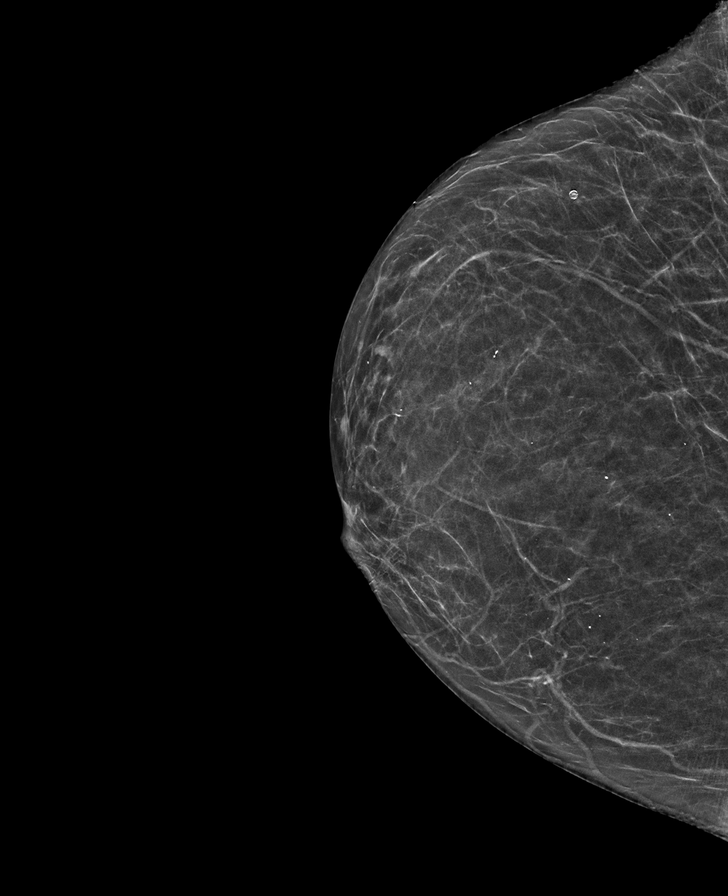

[L CC tomo · tomo slice 44/64.0]
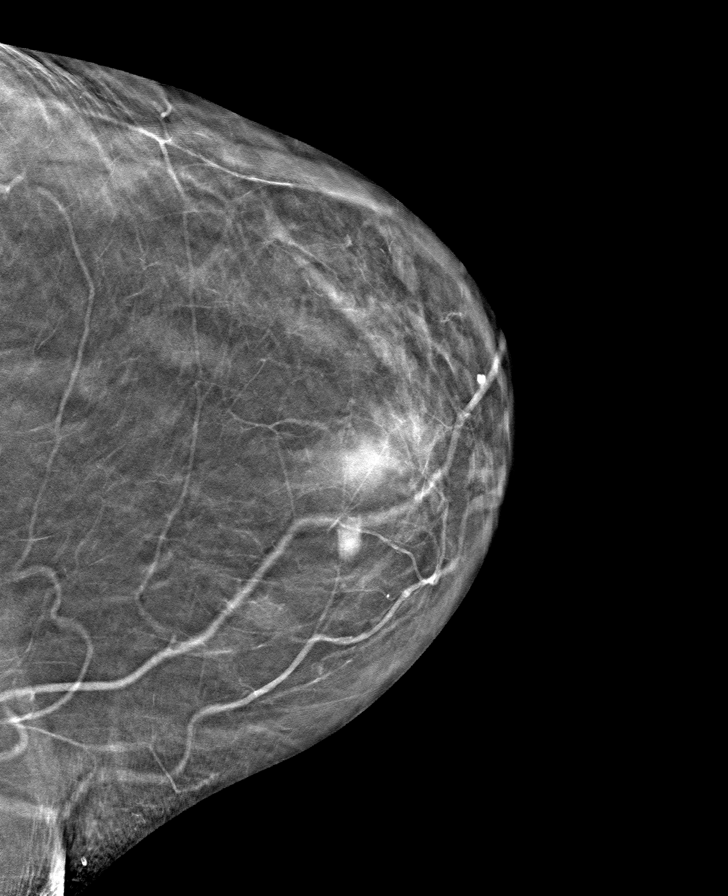

[8 of 40 positions shown; findings below may reference images not displayed]

ACR Breast Density Category b: There are scattered areas of
fibroglandular density.
FINDINGS: There are no findings suspicious for malignancy. Images were
processed with CAD.
IMPRESSION: No mammographic evidence of malignancy. A result letter of this
screening mammogram will be mailed directly to the patient.

RECOMMENDATION:
Screening mammogram in one year. (Code:[id number])

BI-RADS CATEGORY  1: Negative.

## 2021-11-05 ENCOUNTER — Other Ambulatory Visit: Payer: Self-pay | Admitting: Cardiovascular Disease

## 2021-11-05 ENCOUNTER — Ambulatory Visit: Payer: Medicare HMO | Admitting: Obstetrics and Gynecology

## 2021-11-05 ENCOUNTER — Other Ambulatory Visit: Payer: Self-pay | Admitting: Internal Medicine

## 2021-11-05 ENCOUNTER — Other Ambulatory Visit: Payer: Self-pay

## 2021-11-05 DIAGNOSIS — J4540 Moderate persistent asthma, uncomplicated: Secondary | ICD-10-CM

## 2021-11-05 DIAGNOSIS — I480 Paroxysmal atrial fibrillation: Secondary | ICD-10-CM

## 2021-11-05 DIAGNOSIS — Z7901 Long term (current) use of anticoagulants: Secondary | ICD-10-CM

## 2021-11-05 MED ORDER — MONTELUKAST SODIUM 10 MG PO TABS: 10.0000 mg | ORAL_TABLET | Freq: Every day | ORAL | 3 refills | Status: DC

## 2021-11-06 ENCOUNTER — Telehealth: Payer: Self-pay | Admitting: Cardiovascular Disease

## 2021-11-06 ENCOUNTER — Ambulatory Visit: Payer: Medicare HMO | Admitting: Nurse Practitioner

## 2021-11-06 NOTE — Telephone Encounter (Signed)
Pt c/o medication issue:  1. Name of Medication:  losartan (COZAAR) 50 MG tablet  2. How are you currently taking this medication (dosage and times per day)?    3. Are you having a reaction (difficulty breathing--STAT)?   4. What is your medication issue?   Patient's husband would like to confirm whether or not she should still be taking this medication. Please advise.

## 2021-11-06 NOTE — Telephone Encounter (Signed)
Left voicemail message to call back  

## 2021-11-07 ENCOUNTER — Ambulatory Visit: Payer: Medicare HMO | Admitting: Obstetrics and Gynecology

## 2021-11-07 NOTE — Telephone Encounter (Signed)
Spoke with patient and reviewed her medication list and confirmed she should be taking losartan 50 mg once daily. She read back instructions. She then needed to reschedule her appointment due to conflict in scheduling. Rescheduled for day and time that worked for her. She was appreciative for the call with no further questions at this time.

## 2021-11-11 ENCOUNTER — Ambulatory Visit: Payer: Medicare HMO | Admitting: Internal Medicine

## 2021-11-11 ENCOUNTER — Telehealth: Payer: Self-pay | Admitting: Internal Medicine

## 2021-11-11 NOTE — Telephone Encounter (Signed)
Pt husband called in stating pt is not feeling well and think she has a UTI and want to drop off a specimen. Pt husband would like to be called

## 2021-11-12 NOTE — Telephone Encounter (Signed)
Patient's husband called and no one called him back yesterday.

## 2021-11-13 ENCOUNTER — Ambulatory Visit (INDEPENDENT_AMBULATORY_CARE_PROVIDER_SITE_OTHER): Payer: Medicare HMO | Admitting: Family

## 2021-11-13 ENCOUNTER — Encounter: Payer: Self-pay | Admitting: Family

## 2021-11-13 ENCOUNTER — Other Ambulatory Visit: Payer: Self-pay | Admitting: Internal Medicine

## 2021-11-13 VITALS — BP 118/64 | HR 77 | Ht 66.0 in | Wt 273.4 lb

## 2021-11-13 DIAGNOSIS — R0602 Shortness of breath: Secondary | ICD-10-CM

## 2021-11-13 DIAGNOSIS — R300 Dysuria: Secondary | ICD-10-CM

## 2021-11-13 LAB — URINALYSIS, ROUTINE W REFLEX MICROSCOPIC
Bilirubin Urine: NEGATIVE
Hgb urine dipstick: NEGATIVE
Ketones, ur: NEGATIVE
Nitrite: NEGATIVE
RBC / HPF: NONE SEEN (ref 0–?)
Specific Gravity, Urine: 1.01 (ref 1.000–1.030)
Total Protein, Urine: NEGATIVE
Urine Glucose: NEGATIVE
Urobilinogen, UA: 0.2 (ref 0.0–1.0)
pH: 5.5 (ref 5.0–8.0)

## 2021-11-13 LAB — POCT URINALYSIS DIPSTICK
Bilirubin, UA: NEGATIVE
Blood, UA: NEGATIVE
Glucose, UA: NEGATIVE
Ketones, UA: NEGATIVE
Nitrite, UA: NEGATIVE
Protein, UA: POSITIVE — AB
Spec Grav, UA: 1.015 (ref 1.010–1.025)
Urobilinogen, UA: 0.2 E.U./dL
pH, UA: 5.5 (ref 5.0–8.0)

## 2021-11-13 MED ORDER — ESTRADIOL 0.1 MG/GM VA CREA
1.0000 | TOPICAL_CREAM | VAGINAL | 11 refills | Status: DC
Start: 2021-11-14 — End: 2022-03-26

## 2021-11-13 MED ORDER — NITROFURANTOIN MONOHYD MACRO 100 MG PO CAPS: 100.0000 mg | ORAL_CAPSULE | Freq: Two times a day (BID) | ORAL | 0 refills | Status: DC

## 2021-11-13 NOTE — Assessment & Plan Note (Addendum)
No acute respiratory distress.  No adventitious lung sounds.  Weight had increased.   Reviewed previous echocardiogram.  She has not taken Lasix 20 mg today.  advised that she can increase Lasix to '40mg'$  for 2 or 3 days due to her recent weight gain and increased shortness of breath. She has h/o CKD.  She is compliant with Trelegy.  She politely declined chest x-ray at this time.  She will let us know how she is doing so we can monitor clinically.

## 2021-11-13 NOTE — Progress Notes (Signed)
Subjective:    Patient ID: [name], adult    DOB: 21-Apr-1950, 71 y.o.   MRN: [id number]  CC: [name] is a 71 y.o. adult who presents today for an acute visit.    HPI: Accompanied by husband today  She complains of dysuria x 2 weeks, unchanged  Odor to urine.  No fever, hematuria, flank pain.   She is using estrace and would like a refill   H/o CHF   She is compliant with Lasix '20mg'$  daily. She hasnt taken lasix yet today.  Some increased SOB with exertion above baseline. No associated leg swelling, orthopnea, cough, CP.  Endorses occasional wheezing He is compliant with Singulair,Trelegy.  History of atrial fibrillation, CVA, diabetes, asthma, COPD, OSA, UTI, kidney stone Cardiogram 06/03/2021 showed left ventricular ejection fraction 55 to 60%  Compliant Eliquis 5 mg twice daily.  Documented UTI 5 months ago Klebsiella pneumonia  HISTORY:  Past Medical History:  Diagnosis Date   Abnormal antibody titer    Anxiety and depression    Arthritis    Asthma    Atypical chest pain    a. 03/2018 MV: No ischemia/infarct. EF >65%.   Carotid arterial disease ([place])    a. 03/4429 U/S: RICA <54, LICA nl.   Chronic heart failure with preserved ejection fraction (HFpEF) ([place])    a. 08/2013 Echo: EF 60-65%; b. 08/2019 Echo: EF 60-65%; c. 03/2020 Echo: EF 60-65%; d. 05/2021 Echo: EF 55-60%, no rmwa, nl RV fxn, RVSP 54.72mHg. Mildly dil LA, mild MR, mod TR.   CKD (chronic kidney disease), stage III (HCC)    Cystocele    Depression    Diabetes ([place]    DVT (deep venous thrombosis) (HCC)    Righ calf   Dyspnea    11/08/2018 - "more now due to lack of exercise"   Edema    GERD (gastroesophageal reflux disease)    Heart murmur    History of IBS    History of kidney stones    History of methicillin resistant staphylococcus aureus (MRSA) 2008   History of multiple strokes    Hyperlipidemia    Hypertension    Hypothyroidism    Insomnia    Kidney stone    kidney stones with  lithotripsy .  [facility]- "adrenal glands"   Mitral regurgitation    a. 05/2021 Echo: Mild MR.   Neuropathy involving both lower extremities    Non-healing ulcer of left foot (HCC)    Obesity, Class II, BMI 35-39.9, with comorbidity    PAF (paroxysmal atrial fibrillation) ([place]    a. 07/2013 Event monitor: Freq PVCs and 3 short runs of Afib-->CHA2DS2VASc = 7-->Eliquis.   PAH (pulmonary artery hypertension) ([place]    a. 05/2021 Echo: RVSP 54.419mg.   Peripheral vascular disease ([place]   a. 03/2018 LE Angio: Nl renal arteries. Nl aorta & iliacs. Nl LLE arterial flow; b. 06/2020 ABI's: Nl bilat.   Sleep apnea    use C-PAP   Stroke ([place]   03/2020   Tricuspid regurgitation    a. 05/2021 Echo: Mod TR (in setting of RVSP 54.51m23m).   Urinary incontinence    Venous stasis dermatitis of both lower extremities    Past Surgical History:  Procedure Laterality Date   ANKLE SURGERY Right    APPLICATION OF WOUND VAC Left 06/27/2015   Procedure: APPLICATION OF WOUND VAC ( POSSIBLE ) ;  Surgeon: JasAlgernon HuxleyD;  Location: [facility] ORS;  Service: Vascular;  Laterality: Left;   APPLICATION OF WOUND VAC Left 09/25/2016   Procedure: APPLICATION OF WOUND VAC;  Surgeon: [name], DPM;  Location: [facility] ORS;  Service: Podiatry;  Laterality: Left;   arm surgery     right     CHOLECYSTECTOMY     COLONOSCOPY     COLONOSCOPY WITH PROPOFOL N/A 01/11/2017   Procedure: COLONOSCOPY WITH PROPOFOL;  Surgeon: [name], MD;  Location: [facility] ENDOSCOPY;  Service: Endoscopy;  Laterality: N/A;   COLONOSCOPY WITH PROPOFOL N/A 02/22/2017   Procedure: COLONOSCOPY WITH PROPOFOL;  Surgeon: [name], MD;  Location: [facility] ENDOSCOPY;  Service: Endoscopy;  Laterality: N/A;   COLONOSCOPY WITH PROPOFOL N/A 05/31/2017   Procedure: COLONOSCOPY WITH PROPOFOL;  Surgeon: [name], MD;  Location: [facility] ENDOSCOPY;  Service: Endoscopy;  Laterality: N/A;   ESOPHAGOGASTRODUODENOSCOPY (EGD) WITH PROPOFOL N/A  01/11/2017   Procedure: ESOPHAGOGASTRODUODENOSCOPY (EGD) WITH PROPOFOL;  Surgeon: [name], MD;  Location: [facility] ENDOSCOPY;  Service: Endoscopy;  Laterality: N/A;   ESOPHAGOGASTRODUODENOSCOPY (EGD) WITH PROPOFOL N/A 10/25/2018   Procedure: ESOPHAGOGASTRODUODENOSCOPY (EGD) WITH PROPOFOL;  Surgeon: [name], MD;  Location: [facility] ENDOSCOPY;  Service: Endoscopy;  Laterality: N/A;   ESOPHAGOGASTRODUODENOSCOPY (EGD) WITH PROPOFOL N/A 11/29/2019   Procedure: ESOPHAGOGASTRODUODENOSCOPY (EGD) WITH PROPOFOL;  Surgeon: Toledo, Benay Pike, MD;  Location: [facility] ENDOSCOPY;  Service: Gastroenterology;  Laterality: N/A;   HERNIA REPAIR     umbilical   HIATAL HERNIA REPAIR     I & D EXTREMITY Left 06/27/2015   Procedure: IRRIGATION AND DEBRIDEMENT EXTREMITY            ( CALF HEMATOMA ) POSSIBLE WOUND VAC;  Surgeon: [name], MD;  Location: [facility] ORS;  Service: Vascular;  Laterality: Left;   INCISION AND DRAINAGE OF WOUND Left 09/25/2016   Procedure: IRRIGATION AND DEBRIDEMENT - PARTIAL RESECTION OF ACHILLES TENDON WITH WOUND VAC APPLICATION;  Surgeon: [name], DPM;  Location: [facility] ORS;  Service: Podiatry;  Laterality: Left;   INCISION AND DRAINAGE OF WOUND Left 11/09/2018   Procedure: Excision of left foot wound with ACell placement;  Surgeon: Wallace Going, DO;  Location: [place];  Service: Plastics;  Laterality: Left;   IRRIGATION AND DEBRIDEMENT ABSCESS Left 09/07/2016   Procedure: IRRIGATION AND DEBRIDEMENT ABSCESS LEFT HEEL;  Surgeon: [name], DPM;  Location: [facility] ORS;  Service: Podiatry;  Laterality: Left;   JOINT REPLACEMENT  2013   left knee replacement   KIDNEY SURGERY Right    kidney stones   LITHOTRIPSY     LOWER EXTREMITY ANGIOGRAPHY Left 04/04/2018   Procedure: LOWER EXTREMITY ANGIOGRAPHY;  Surgeon: [name], MD;  Location: [place] CV LAB;  Service: Cardiovascular;  Laterality: Left;   NM GATED MYOVIEW [facility] HX)  February 2017   Likely breast attenuation.  LOW RISK study. Normal EF 55-60%.   OTHER SURGICAL HISTORY     08/2016 or 09/2016 surgery on achilles tenso h/o staph infection heal. Dr. [name]   removal of left hematoma Left    leg   REVERSE SHOULDER ARTHROPLASTY Right 10/08/2015   Procedure: REVERSE SHOULDER ARTHROPLASTY;  Surgeon: [name], MD;  Location: [facility] ORS;  Service: Orthopedics;  Laterality: Right;   SLEEVE GASTROPLASTY     TONSILLECTOMY     TOTAL KNEE ARTHROPLASTY Left    TOTAL SHOULDER REPLACEMENT Right    TRANSESOPHAGEAL ECHOCARDIOGRAM  08/08/2013   Mild LVH, EF 60-65%. Moderate LA dilation and mild RA dilation. Mild MR with no evidence of stenosis and  no evidence of endocarditis. A false chordae is noted.   TRANSTHORACIC ECHOCARDIOGRAM  08/03/2013   Mild-Moderate concentric LVH, EF 60-65%. Normal diastolic function. Mild LA dilation. Mild MR with possible vegetation  - not confirmed on TEE    ULNAR NERVE TRANSPOSITION Right 08/06/2016   Procedure: ULNAR NERVE DECOMPRESSION/TRANSPOSITION;  Surgeon: [name], MD;  Location: [facility] ORS;  Service: Orthopedics;  Laterality: Right;   UPPER GI ENDOSCOPY     VAGINAL HYSTERECTOMY     Family History  Problem Relation Age of Onset   Skin cancer Father    Diabetes Father    Hypertension Father    Peripheral vascular disease Father    Cancer Father    Cerebral aneurysm Father    Alcohol abuse Father    Varicose Veins Mother    Kidney disease Mother    Arthritis Mother    Mental illness Sister    Cancer Maternal Aunt        breast   Breast cancer Maternal Aunt    Arthritis Maternal Grandmother    Hypertension Maternal Grandmother    Diabetes Maternal Grandmother    Arthritis Maternal Grandfather    Heart disease Maternal Grandfather    Hypertension Maternal Grandfather    Arthritis Paternal Grandmother    Hypertension Paternal Grandmother    Diabetes Paternal Grandmother    Arthritis Paternal Grandfather    Hypertension Paternal Grandfather    Bladder Cancer Neg  Hx    Kidney cancer Neg Hx     Allergies: Morphine and related, Penicillins, Ambien [zolpidem], Aspirin, and Oxytetracycline Current Outpatient Medications on File Prior to Visit  Medication Sig Dispense Refill   acetaminophen (TYLENOL) 325 MG tablet Take 2 tablets (650 mg total) by mouth every 6 (six) hours as needed for mild pain or moderate pain (or Fever >/= 101).     albuterol (PROVENTIL) (2.5 MG/3ML) 0.083% nebulizer solution Take 3 mLs (2.5 mg total) by nebulization every 4 (four) hours as needed for wheezing or shortness of breath. 75 mL 12   ALPRAZolam (XANAX) 0.5 MG tablet TAKE ONE-HALF TABLET BY MOUTH TWICE DAILY AS NEEDED FOR ANXIETY 60 tablet 1   benzonatate (TESSALON) 200 MG capsule Take 1 capsule (200 mg total) by mouth 3 (three) times daily as needed for cough. 30 capsule 1   buPROPion (WELLBUTRIN XL) 150 MG 24 hr tablet TAKE ONE TABLET BY MOUTH EVERY DAY 90 tablet 3   calcium-vitamin D (OSCAL WITH D) 500-200 MG-UNIT tablet Take 1 tablet by mouth 2 (two) times daily with a meal. 60 tablet 0   cholecalciferol (VITAMIN D) 25 MCG tablet Take 1 tablet (1,000 Units total) by mouth 2 (two) times daily with a meal. 30 tablet 2   clobetasol cream (TEMOVATE) 7.40 % Apply 1 application topically 2 (two) times daily as needed (skin irritation).      colchicine 0.6 MG tablet Take 1 tablet (0.6 mg total) by mouth as needed. May repeat 1 pill in 1 hr. No more than 3 total pills 1.8 mg in 24 hours as needed gout flare 30 tablet 2   diclofenac Sodium (VOLTAREN) 1 % GEL Apply 2 g topically 3 (three) times daily. 100 g 2   DULoxetine (CYMBALTA) 60 MG capsule TAKE 1 CAPSULE BY MOUTH ONCE DAILY 90 capsule 3   ELIQUIS 5 MG TABS tablet TAKE 1 TABLET BY MOUTH TWICE DAILY 180 tablet 1   ergocalciferol (VITAMIN D2) 1.25 MG (50000 UT) capsule Take 50,000 Units by mouth once a week.  estradiol (ESTRACE) 0.1 MG/GM vaginal cream Place 1 Applicatorful vaginally every Monday, Wednesday, and Friday. Apply  0.'5mg'$  (pea-sized amount)  just inside the vaginal introitus with a finger-tip on Monday, Wednesday and Friday nights, 42.5 g 11   fluconazole (DIFLUCAN) 150 MG tablet Take 1 tablet (150 mg total) by mouth once a week. X 2-4 weeks 4 tablet 0   Fluticasone-Umeclidin-Vilant (TRELEGY ELLIPTA) 200-62.5-25 MCG/ACT AEPB Inhale 1 puff into the lungs daily. 60 each 0   folic acid (FOLVITE) 1 MG tablet TAKE 1 TABLET BY MOUTH DAILY 90 tablet 3   GEMTESA 75 MG TABS TAKE 1 TABLET BY MOUTH DAILY 30 tablet 11   HYDROcodone-acetaminophen (NORCO/VICODIN) 5-325 MG tablet Take 1 tablet by mouth 2 (two) times daily as needed for severe pain. Must last 30 days 45 tablet 0   [START ON 12/05/2021] HYDROcodone-acetaminophen (NORCO/VICODIN) 5-325 MG tablet Take 1 tablet by mouth 2 (two) times daily as needed for severe pain. Must last 30 days 45 tablet 0   [START ON 01/04/2022] HYDROcodone-acetaminophen (NORCO/VICODIN) 5-325 MG tablet Take 1 tablet by mouth 2 (two) times daily as needed for severe pain. Must last 30 days 45 tablet 0   levocetirizine (XYZAL) 5 MG tablet TAKE ONE TABLET BY MOUTH AT BEDTIME AS NEEDED FOR ALLERGIES 90 tablet 3   levothyroxine (SYNTHROID) 137 MCG tablet Take 1 tablet (137 mcg total) by mouth daily before breakfast. 30 minutes do not mix with vitamins or acid reflux medications D/c 150 mcg 90 tablet 3   losartan (COZAAR) 50 MG tablet Take 1 tablet (50 mg total) by mouth daily. 90 tablet 3   metFORMIN (GLUCOPHAGE) 500 MG tablet Take 1 tablet (500 mg total) by mouth daily after supper. 90 tablet 3   metoprolol succinate (TOPROL-XL) 25 MG 24 hr tablet Take 0.5 tablets (12.5 mg total) by mouth daily. 45 tablet 3   Misc. Devices (TUB TRANSFER BOARD) MISC 1 Device by Does not apply route as needed. 1 each 0   montelukast (SINGULAIR) 10 MG tablet Take 1 tablet (10 mg total) by mouth at bedtime. 90 tablet 3   Multiple Vitamin (MULTIVITAMIN WITH MINERALS) TABS tablet Take 1 tablet by mouth daily.      mupirocin ointment (BACTROBAN) 2 % APPLY TOPICALLY TWICE DAILY TO RIGHT LOWER LEG AS DIRECTED. 22 g 0   nystatin cream (MYCOSTATIN) Apply 1 application. topically 2 (two) times daily. Angles of mouth or skin folds prn 30 g 11   ondansetron (ZOFRAN) 4 MG tablet Take 1 tablet (4 mg total) by mouth every 8 (eight) hours as needed for nausea or vomiting. 90 tablet 1   pantoprazole (PROTONIX) 40 MG tablet Take 1 tablet (40 mg total) by mouth daily. 30 min before food 90 tablet 3   Powders (GOLD BOND NO MESS BODY POWDER) AERP Apply 1 application. topically daily as needed. Gold bond talc free powder 200 g 11   pregabalin (LYRICA) 50 MG capsule TAKE 1 CAPSULE BY MOUTH 3 TIMES DAILY 90 capsule 2   rosuvastatin (CRESTOR) 20 MG tablet TAKE 1 TABLET BY  MOUTH ONCE DAILY 30 tablet 1   sitaGLIPtin (JANUVIA) 50 MG tablet Take 1 tablet (50 mg total) by mouth daily. 90 tablet 3   sucralfate (CARAFATE) 1 GM/10ML suspension Take 1 g by mouth 3 (three) times daily.      thiamine 100 MG tablet Take 0.5 tablets (50 mg total) by mouth 2 (two) times daily with a meal. 60 tablet 2   furosemide (LASIX) 20  MG tablet Take 1 tablet (20 mg total) by mouth as directed. Take 1 tablet daily. May take additional 1 tablet If weight gain of greater than 2 pounds in 24 hours or 5 pounds in one week. 90 tablet 3   No current facility-administered medications on file prior to visit.    Social History   Tobacco Use   Smoking status: Never   Smokeless tobacco: Never  Vaping Use   Vaping Use: Never used  Substance Use Topics   Alcohol use: No    Alcohol/week: 0.0 standard drinks of alcohol   Drug use: Never    Review of Systems  Constitutional:  Negative for chills and fever.  Respiratory:  Positive for shortness of breath. Negative for cough.   Cardiovascular:  Negative for chest pain, palpitations and leg swelling.  Gastrointestinal:  Negative for nausea and vomiting.  Genitourinary:  Positive for dysuria.       Objective:    BP 118/64 (BP Location: Right Arm, Patient Position: Sitting, Cuff Size: Large)   Pulse 77   Ht '5\' 6"'$  (1.676 m)   Wt 273 lb 6.4 oz (124 kg)   SpO2 94%   BMI 44.13 kg/m   Wt Readings from Last 3 Encounters:  11/13/21 273 lb 6.4 oz (124 kg)  10/29/21 259 lb (117.5 kg)  07/30/21 252 lb (114.3 kg)    Physical Exam Vitals reviewed.  Constitutional:      Appearance: She is well-developed.  Cardiovascular:     Rate and Rhythm: Normal rate and regular rhythm.     Pulses: Normal pulses.     Heart sounds: Normal heart sounds.     Comments: Wearing compression stockings. Pulmonary:     Effort: Pulmonary effort is normal.     Breath sounds: Normal breath sounds. No wheezing, rhonchi or rales.  Musculoskeletal:     Right lower leg: No edema.     Left lower leg: No edema.  Skin:    General: Skin is warm and dry.  Neurological:     Mental Status: She is alert.  Psychiatric:        Speech: Speech normal.        Behavior: Behavior normal.        Thought Content: Thought content normal.        Assessment & Plan:   Problem List Items Addressed This Visit       Other   Dysuria - Primary    Patient has experienced dysuria for 2 weeks.  She was unable to urinate during OV and left urine after visit. We jointly agreed we can start Macrobid head of urine culture based on duration of symptoms.  Advise probiotics.  Currently pending urine studies at this time      Relevant Medications   nitrofurantoin, macrocrystal-monohydrate, (MACROBID) 100 MG capsule   Other Relevant Orders   Urinalysis, Routine w reflex microscopic   Urine Culture   RESOLVED: Shortness of breath    No acute respiratory distress.  No adventitious lung sounds.  Weight had increased.   Reviewed previous echocardiogram.  She has not taken Lasix 20 mg today.  advised that she can increase Lasix to '40mg'$  for 2 or 3 days due to her recent weight gain and increased shortness of breath. She has h/o CKD.   She is compliant with Trelegy.  She politely declined chest x-ray at this time.  She will let us know how she is doing.       SOB (shortness of  breath)    No acute respiratory distress.  No adventitious lung sounds.  Weight had increased.   Reviewed previous echocardiogram.  She has not taken Lasix 20 mg today.  advised that she can increase Lasix to '40mg'$  for 2 or 3 days due to her recent weight gain and increased shortness of breath. She has h/o CKD.  She is compliant with Trelegy.  She politely declined chest x-ray at this time.  She will let us know how she is doing so we can monitor clinically.           I am having Izora Gala B. Mancel Bale start on nitrofurantoin (macrocrystal-monohydrate). I am also having her maintain her ondansetron, acetaminophen, sucralfate, clobetasol cream, ergocalciferol, vitamin D3, diclofenac Sodium, multivitamin with minerals, thiamine, calcium-vitamin D, Tub Transfer Board, estradiol, albuterol, colchicine, sitaGLIPtin, pantoprazole, benzonatate, folic acid, Trelegy Ellipta, levothyroxine, fluconazole, nystatin cream, Gold Bond No Mess Body Powder, metFORMIN, metoprolol succinate, losartan, buPROPion, Gemtesa, furosemide, ALPRAZolam, pregabalin, rosuvastatin, levocetirizine, mupirocin ointment, DULoxetine, HYDROcodone-acetaminophen, HYDROcodone-acetaminophen, HYDROcodone-acetaminophen, montelukast, and Eliquis.   Meds ordered this encounter  Medications   nitrofurantoin, macrocrystal-monohydrate, (MACROBID) 100 MG capsule    Sig: Take 1 capsule (100 mg total) by mouth 2 (two) times daily. Take with food.    Dispense:  10 capsule    Refill:  0    Order Specific Question:   Supervising Provider    Answer:   Crecencio Mc [[id number]]    Return precautions given.   Risks, benefits, and alternatives of the medications and treatment plan prescribed today were discussed, and patient expressed understanding.   Education regarding symptom management and diagnosis given to  patient on AVS.  Continue to follow with [name], [name], MD for routine health maintenance.   [name] and I agreed with plan.   Mable Paris, FNP

## 2021-11-13 NOTE — Assessment & Plan Note (Addendum)
Patient has experienced dysuria for 2 weeks.  She was unable to urinate during OV and left urine after visit. We jointly agreed we can start Macrobid head of urine culture based on duration of symptoms.  Advise probiotics.  Currently pending urine studies at this time

## 2021-11-13 NOTE — Addendum Note (Signed)
Addended by: [place], Zarai Orsborn on: 11/13/2021 02:28 PM   Modules accepted: Orders

## 2021-11-13 NOTE — Patient Instructions (Addendum)
You may trial D mannose to prevent urinary tract infections  Continue cranberry supplements  I have sent in [place] (antibiotic) as we discussed ahead of urine culture most certainly I want you to start this if you feel symptoms are worsening as we wait on urine culture.  I do not anticipate urine culture to perhaps end of the weekend or latest Monday.  We will certainly call you if Macrobid needs to be changed to another antibiotic  Ensure to take probiotics while on antibiotics and also for 2 weeks after completion. This can either be by eating yogurt daily or taking a probiotic supplement over the counter such as Culturelle.It is important to re-colonize the gut with good bacteria and also to prevent any diarrheal infections associated with antibiotic use.   As discussed, I would you to take Lasix 40 mg for the next 2 or 3 days due to the weight gain and increased shortness of breath.  Most certainly if you develop an increase shortness of breath, please have a low threshold for going to the emergency room for an in person evaluation in the setting of heart failure

## 2021-11-13 NOTE — Assessment & Plan Note (Addendum)
No acute respiratory distress.  No adventitious lung sounds.  Weight had increased.   Reviewed previous echocardiogram.  She has not taken Lasix 20 mg today.  advised that she can increase Lasix to '40mg'$  for 2 or 3 days due to her recent weight gain and increased shortness of breath. She has h/o CKD.  She is compliant with Trelegy.  She politely declined chest x-ray at this time.  She will let us know how she is doing.

## 2021-11-14 ENCOUNTER — Ambulatory Visit: Payer: Medicare HMO | Admitting: Obstetrics and Gynecology

## 2021-11-15 LAB — URINE CULTURE
MICRO NUMBER:: 13826872
SPECIMEN QUALITY:: ADEQUATE

## 2021-11-17 ENCOUNTER — Other Ambulatory Visit: Payer: Self-pay

## 2021-11-17 ENCOUNTER — Telehealth: Payer: Self-pay

## 2021-11-17 DIAGNOSIS — R300 Dysuria: Secondary | ICD-10-CM

## 2021-11-17 MED ORDER — CIPROFLOXACIN HCL 250 MG PO TABS: 250.0000 mg | ORAL_TABLET | Freq: Two times a day (BID) | ORAL | 0 refills | Status: AC

## 2021-11-17 NOTE — Addendum Note (Signed)
Addended by: [place], Danelly Hassinger on: 11/17/2021 08:38 AM   Modules accepted: Orders

## 2021-11-17 NOTE — Telephone Encounter (Signed)
LVM to call back to go over results 

## 2021-11-17 NOTE — Progress Notes (Signed)
Orders only

## 2021-11-17 NOTE — Progress Notes (Signed)
Ciproflaxin

## 2021-11-19 IMAGING — DX DG CHEST 1V PORT
1 series · 1 of 1 positions shown · non-contrast
Comparison: Chest radiograph and chest CT July 28, 2018

CLINICAL DATA: Altered mental status with weakness

EXAM:
PORTABLE CHEST 1 VIEW

[chest ap]
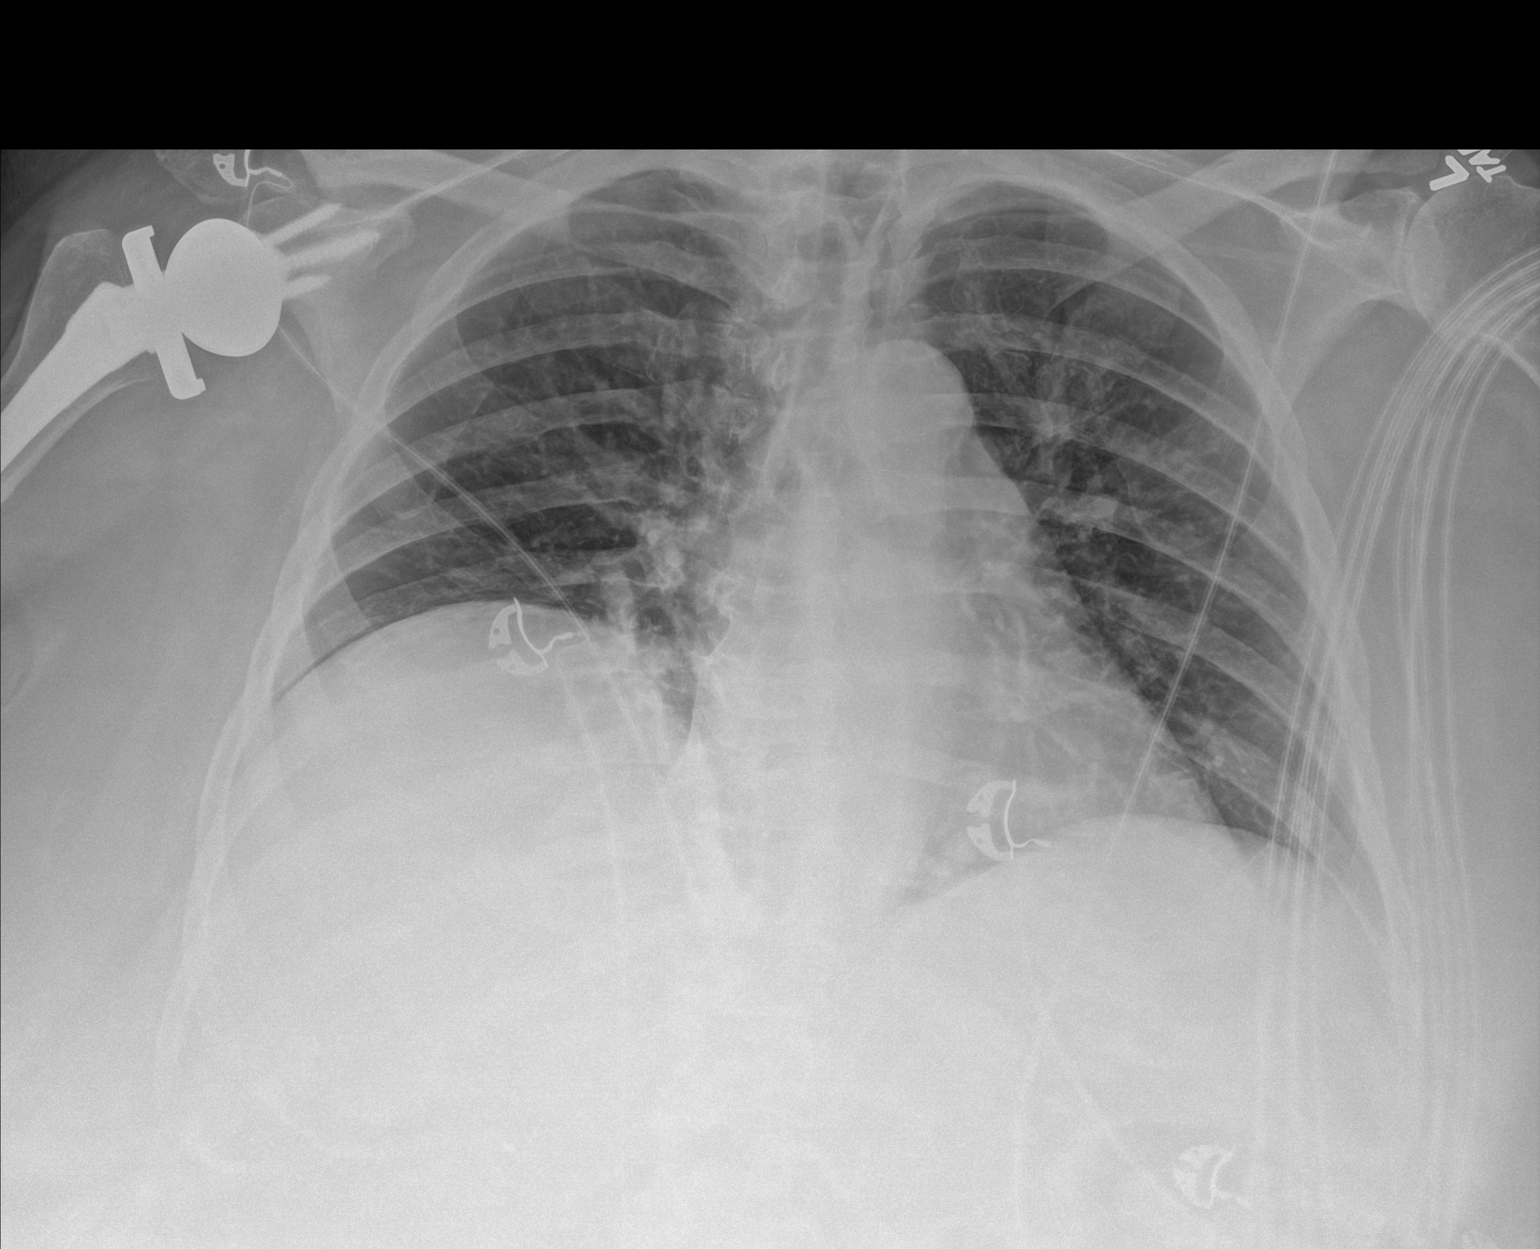

[1 of 1 positions shown; findings below may reference images not displayed]

FINDINGS: There is stable elevation of the right hemidiaphragm. No edema or
airspace opacity. Heart size and pulmonary vascularity normal. No
adenopathy. There is a total shoulder replacement on the right.
IMPRESSION: Elevation right hemidiaphragm, stable. No edema or airspace opacity.
Stable cardiac silhouette. No adenopathy.

## 2021-11-20 IMAGING — DX DG FOOT 2V*R*
2 series · 2 of 2 positions shown · non-contrast
Comparison: 05/29/2010

CLINICAL DATA: Foot pain, unable to bear weight on left foot

EXAM:
LEFT FOOT - 2 VIEW; RIGHT FOOT - 2 VIEW

[foot ap]
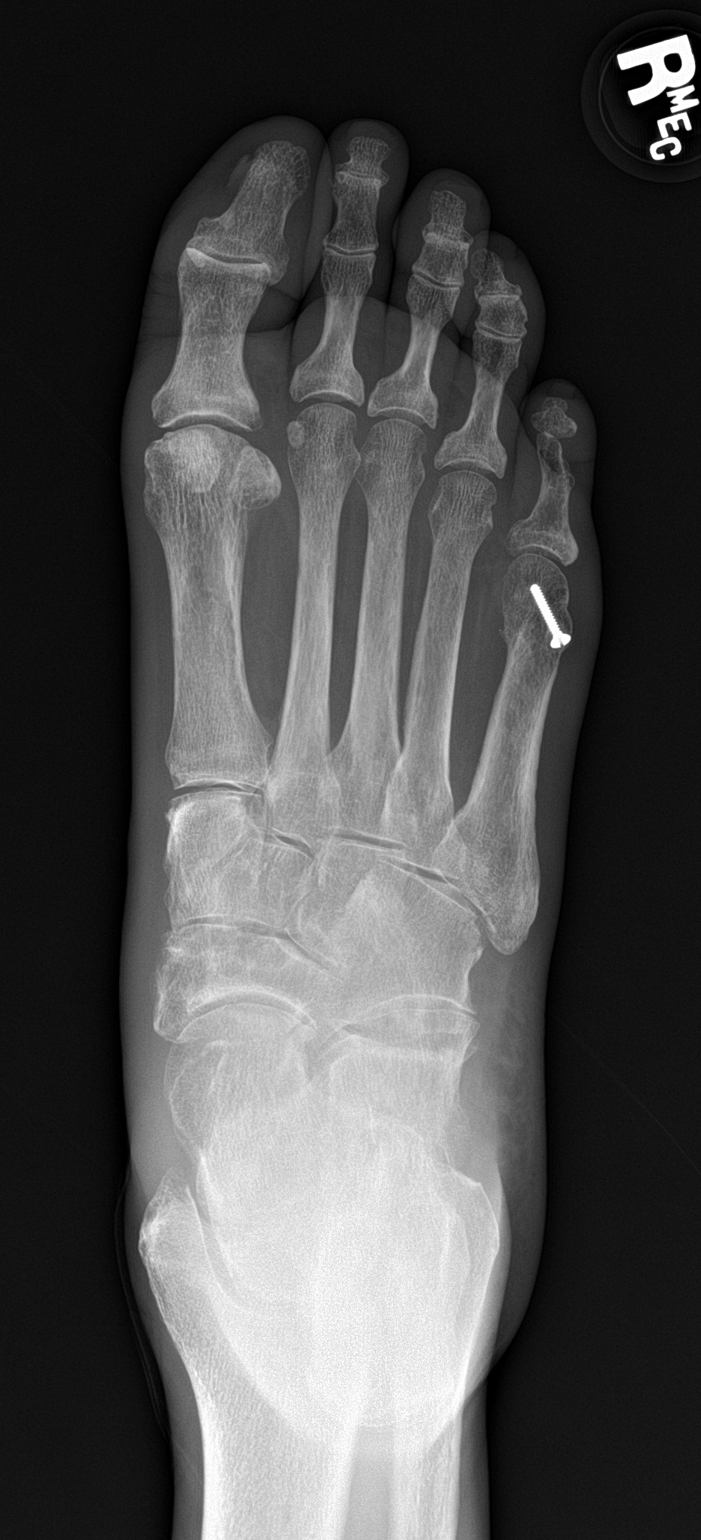

[foot lat]
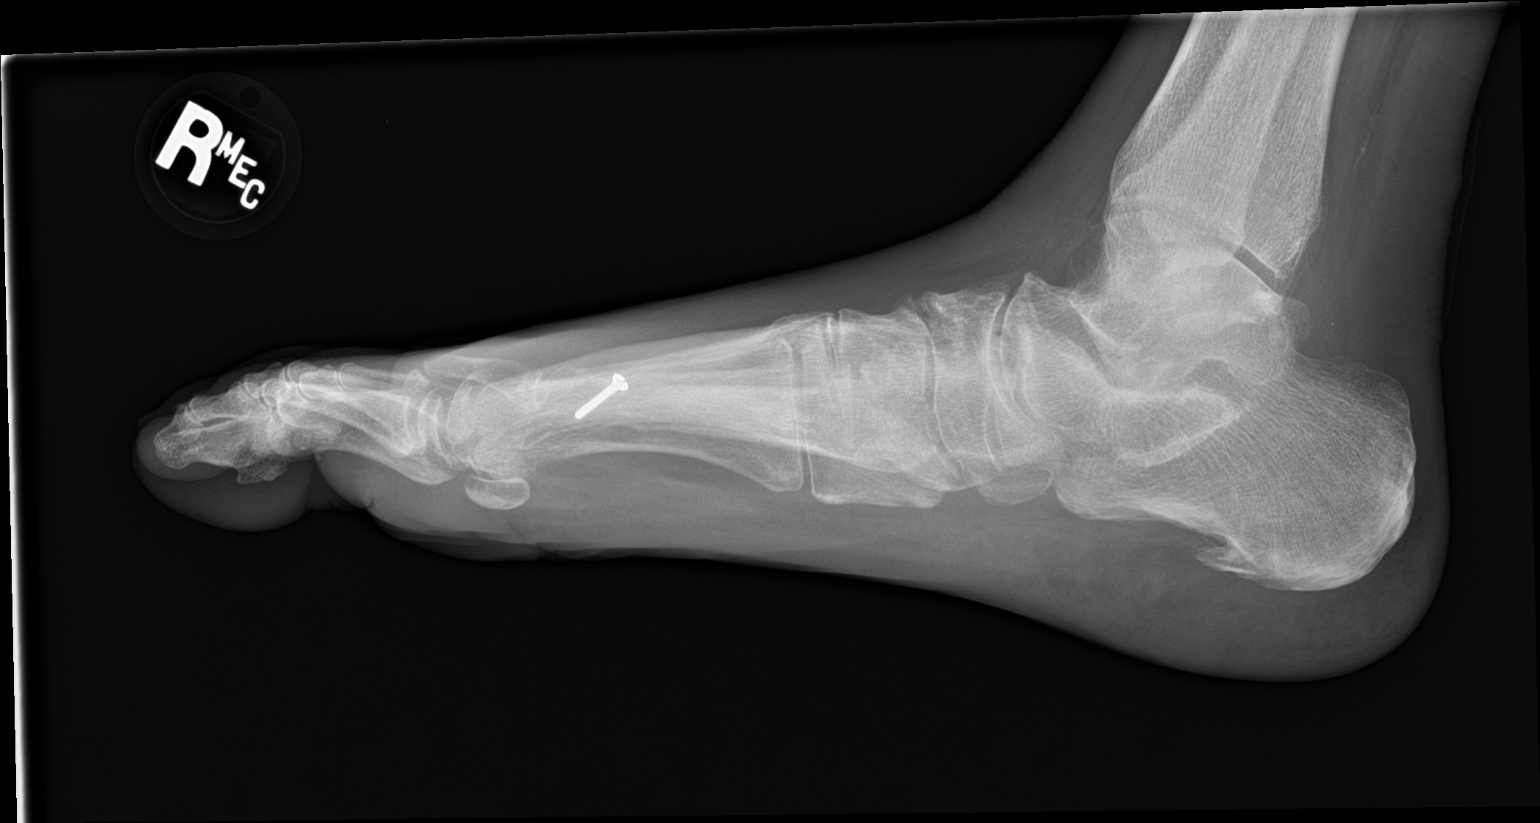

[2 of 2 positions shown; findings below may reference images not displayed]

FINDINGS: No fracture or dislocation of the bilateral feet. There is a bunion
deformity of the left great toe with crossover hammertoe deformity
of the left second toe. Severe left first metatarsophalangeal
arthrosis. Moderate bilateral midfoot arthrosis. Prior right fifth
metatarsal osteotomy. Soft tissues are unremarkable.
IMPRESSION: 1. No fracture or dislocation of the bilateral feet.
2. Severe left first metatarsophalangeal arthrosis.
3. Moderate bilateral midfoot arthrosis.

## 2021-11-20 IMAGING — DX DG FOOT 2V*L*
2 series · 2 of 2 positions shown · non-contrast
Comparison: 05/29/2010

CLINICAL DATA: Foot pain, unable to bear weight on left foot

EXAM:
LEFT FOOT - 2 VIEW; RIGHT FOOT - 2 VIEW

[foot ap]
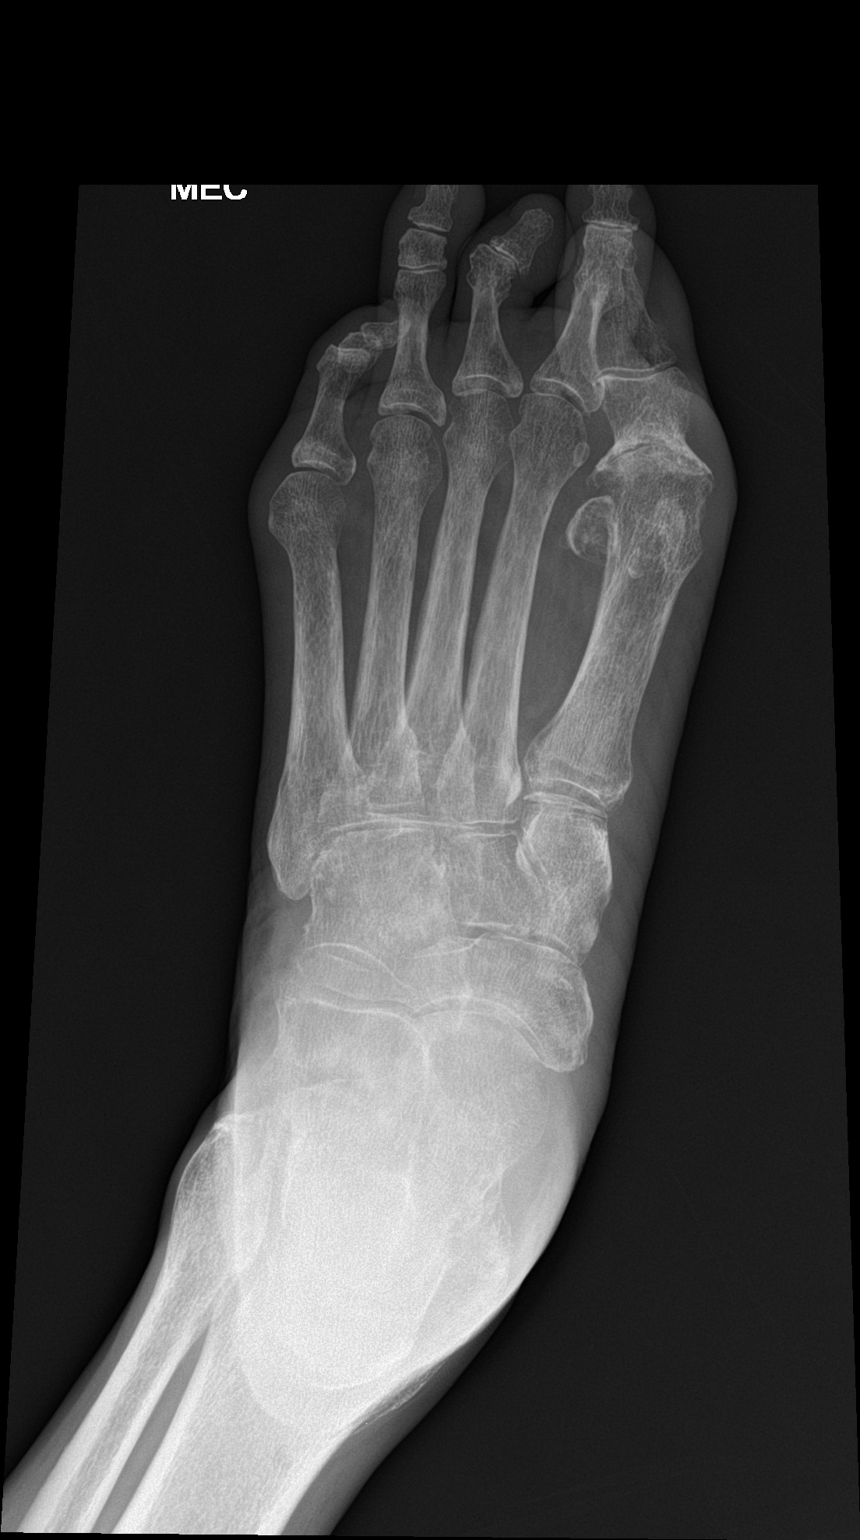

[foot lat]
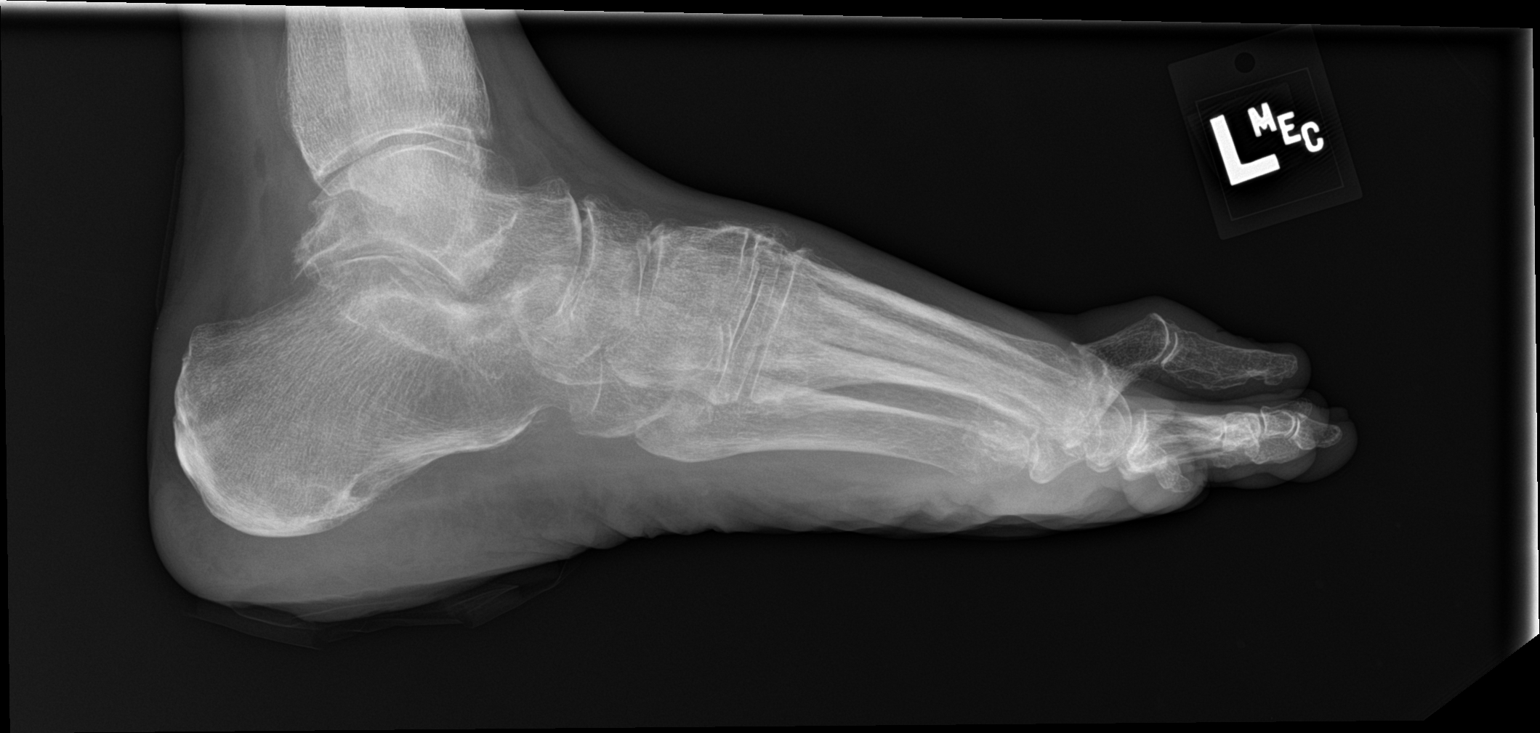

[2 of 2 positions shown; findings below may reference images not displayed]

FINDINGS: No fracture or dislocation of the bilateral feet. There is a bunion
deformity of the left great toe with crossover hammertoe deformity
of the left second toe. Severe left first metatarsophalangeal
arthrosis. Moderate bilateral midfoot arthrosis. Prior right fifth
metatarsal osteotomy. Soft tissues are unremarkable.
IMPRESSION: 1. No fracture or dislocation of the bilateral feet.
2. Severe left first metatarsophalangeal arthrosis.
3. Moderate bilateral midfoot arthrosis.

## 2021-11-21 ENCOUNTER — Other Ambulatory Visit: Payer: Self-pay | Admitting: Cardiovascular Disease

## 2021-11-21 DIAGNOSIS — Z8673 Personal history of transient ischemic attack (TIA), and cerebral infarction without residual deficits: Secondary | ICD-10-CM

## 2021-11-26 ENCOUNTER — Ambulatory Visit: Payer: Medicare HMO | Admitting: Nurse Practitioner

## 2021-11-26 DIAGNOSIS — E119 Type 2 diabetes mellitus without complications: Secondary | ICD-10-CM | POA: Diagnosis not present

## 2021-11-26 DIAGNOSIS — M19012 Primary osteoarthritis, left shoulder: Secondary | ICD-10-CM | POA: Diagnosis not present

## 2021-11-26 NOTE — Progress Notes (Deleted)
Office Visit    Patient Name: [name] Date of Encounter: 11/26/2021  Primary Care Provider:  [name], [name], MD Primary Cardiologist:  Kathlyn Sacramento, MD  Chief Complaint    ***  Past Medical History    Past Medical History:  Diagnosis Date   Abnormal antibody titer    Anxiety and depression    Arthritis    Asthma    Atypical chest pain    a. 03/2018 MV: No ischemia/infarct. EF >65%.   Carotid arterial disease ([place])    a. 03/7791 U/S: RICA <90, LICA nl.   Chronic heart failure with preserved ejection fraction (HFpEF) ([place])    a. 08/2013 Echo: EF 60-65%; b. 08/2019 Echo: EF 60-65%; c. 03/2020 Echo: EF 60-65%; d. 05/2021 Echo: EF 55-60%, no rmwa, nl RV fxn, RVSP 54.66mHg. Mildly dil LA, mild MR, mod TR.   CKD (chronic kidney disease), stage III (HCC)    Cystocele    Depression    Diabetes ([place]    DVT (deep venous thrombosis) (HCC)    Righ calf   Dyspnea    11/08/2018 - "more now due to lack of exercise"   Edema    GERD (gastroesophageal reflux disease)    Heart murmur    History of IBS    History of kidney stones    History of methicillin resistant staphylococcus aureus (MRSA) 2008   History of multiple strokes    Hyperlipidemia    Hypertension    Hypothyroidism    Insomnia    Kidney stone    kidney stones with lithotripsy .  CBrierKidney- "adrenal glands"   Mitral regurgitation    a. 05/2021 Echo: Mild MR.   Neuropathy involving both lower extremities    Non-healing ulcer of left foot (HCC)    Obesity, Class II, BMI 35-39.9, with comorbidity    PAF (paroxysmal atrial fibrillation) ([place]    a. 07/2013 Event monitor: Freq PVCs and 3 short runs of Afib-->CHA2DS2VASc = 7-->Eliquis.   PAH (pulmonary artery hypertension) ([place]    a. 05/2021 Echo: RVSP 54.4560mg.   Peripheral vascular disease ([place]   a. 03/2018 LE Angio: Nl renal arteries. Nl aorta & iliacs. Nl LLE arterial flow; b. 06/2020 ABI's: Nl bilat.   Sleep apnea    use C-PAP   Stroke ([place]    03/2020   Tricuspid regurgitation    a. 05/2021 Echo: Mod TR (in setting of RVSP 54.60m74m).   Urinary incontinence    Venous stasis dermatitis of both lower extremities    Past Surgical History:  Procedure Laterality Date   ANKLE SURGERY Right    APPLICATION OF WOUND VAC Left 06/27/2015   Procedure: APPLICATION OF WOUND VAC ( POSSIBLE ) ;  Surgeon: JasAlgernon HuxleyD;  Location: [facility] ORS;  Service: Vascular;  Laterality: Left;   APPLICATION OF WOUND VAC Left 09/25/2016   Procedure: APPLICATION OF WOUND VAC;  Surgeon: TroAlbertine PatriciaPM;  Location: [facility];  Service: Podiatry;  Laterality: Left;   arm surgery     right     CHOLECYSTECTOMY     COLONOSCOPY     COLONOSCOPY WITH PROPOFOL N/A 01/11/2017   Procedure: COLONOSCOPY WITH PROPOFOL;  Surgeon: SkuLollie SailsD;  [facility];  Service: Endoscopy;  Laterality: N/A;   COLONOSCOPY WITH PROPOFOL N/A 02/22/2017   Procedure: COLONOSCOPY WITH PROPOFOL;  Surgeon: SkuLollie SailsD;  [facility];  Service: Endoscopy;  Laterality: N/A;   COLONOSCOPY WITH PROPOFOL  N/A 05/31/2017   Procedure: COLONOSCOPY WITH PROPOFOL;  Surgeon: [name], MD;  Location[facility] ENDOSCOPY;  Service: Endoscopy;  Laterality: N/A;   ESOPHAGOGASTRODUODENOSCOPY (EGD) WITH PROPOFOL N/A 01/11/2017   Procedure: ESOPHAGOGASTRODUODENOSCOPY (EGD) WITH PROPOFOL;  Surgeon: [name], MD;  Location: [facility] ENDOSCOPY;  Service: Endoscopy;  Laterality: N/A;   ESOPHAGOGASTRODUODENOSCOPY (EGD) WITH PROPOFOL N/A 10/25/2018   Procedure: ESOPHAGOGASTRODUODENOSCOPY (EGD) WITH PROPOFOL;  Surgeon: [name], MD;  [facility];  Service: Endoscopy;  Laterality: N/A;   ESOPHAGOGASTRODUODENOSCOPY (EGD) WITH PROPOFOL N/A 11/29/2019   Procedure: ESOPHAGOGASTRODUODENOSCOPY (EGD) WITH PROPOFOL;  Surgeon: Toledo, Benay Pike, MD;  Location: [facility] ENDOSCOPY;  Service: Gastroenterology;  Laterality: N/A;   HERNIA REPAIR     umbilical    HIATAL HERNIA REPAIR     I & D EXTREMITY Left 06/27/2015   Procedure: IRRIGATION AND DEBRIDEMENT EXTREMITY            ( CALF HEMATOMA ) POSSIBLE WOUND VAC;  Surgeon: [name], MD;  Location: [facility];  Service: Vascular;  Laterality: Left;   INCISION AND DRAINAGE OF WOUND Left 09/25/2016   Procedure: IRRIGATION AND DEBRIDEMENT - PARTIAL RESECTION OF ACHILLES TENDON WITH WOUND VAC APPLICATION;  Surgeon: [name], DPM;  Location: [facility] ORS;  Service: Podiatry;  Laterality: Left;   INCISION AND DRAINAGE OF WOUND Left 11/09/2018   Procedure: Excision of left foot wound with ACell placement;  Surgeon: Wallace Going, DO;  Location: [place];  Service: Plastics;  Laterality: Left;   IRRIGATION AND DEBRIDEMENT ABSCESS Left 09/07/2016   Procedure: IRRIGATION AND DEBRIDEMENT ABSCESS LEFT HEEL;  Surgeon: [name], DPM;  Location: [facility];  Service: Podiatry;  Laterality: Left;   JOINT REPLACEMENT  2013   left knee replacement   KIDNEY SURGERY Right    kidney stones   LITHOTRIPSY     LOWER EXTREMITY ANGIOGRAPHY Left 04/04/2018   Procedure: LOWER EXTREMITY ANGIOGRAPHY;  Surgeon: [name], MD;  Location: [place] CV LAB;  Service: Cardiovascular;  Laterality: Left;   NM GATED MYOVIEW [facility] HX)  February 2017   Likely breast attenuation. LOW RISK study. Normal EF 55-60%.   OTHER SURGICAL HISTORY     08/2016 or 09/2016 surgery on achilles tenso h/o staph infection heal. Dr. [name]   removal of left hematoma Left    leg   REVERSE SHOULDER ARTHROPLASTY Right 10/08/2015   Procedure: REVERSE SHOULDER ARTHROPLASTY;  Surgeon: [name], MD;  Location: [facility] OR[facility];  Service: Orthopedics;  Laterality: Right;   SLEEVE GASTROPLASTY     TONSILLECTOMY     TOTAL KNEE ARTHROPLASTY Left    TOTAL SHOULDER REPLACEMENT Right    TRANSESOPHAGEAL ECHOCARDIOGRAM  08/08/2013   Mild LVH, EF 60-65%. Moderate LA dilation and mild RA dilation. Mild MR with no evidence of stenosis and no evidence of  endocarditis. A false chordae is noted.   TRANSTHORACIC ECHOCARDIOGRAM  08/03/2013   Mild-Moderate concentric LVH, EF 60-65%. Normal diastolic function. Mild LA dilation. Mild MR with possible vegetation  - not confirmed on TEE    ULNAR NERVE TRANSPOSITION Right 08/06/2016   Procedure: ULNAR NERVE DECOMPRESSION/TRANSPOSITION;  Surgeon: [name], MD;  Location: [facility] OR[facility];  Service: Orthopedics;  Laterality: Right;   UPPER GI ENDOSCOPY     VAGINAL HYSTERECTOMY      Allergies  Allergies  Allergen Reactions   Morphine And Related Other (See Comments) and Nausea Only    Patient becomes very confused Other reaction(s): Other (See Comments), Other (See  Comments) Patient becomes very confused Patient becomes very confused    Penicillins Hives, Shortness Of Breath, Swelling and Other (See Comments)    Facial swelling Has patient had a PCN reaction causing immediate rash, facial/tongue/throat swelling, SOB or lightheadedness with hypotension: Yes Has patient had a PCN reaction causing severe rash involving mucus membranes or skin necrosis: Yes Has patient had a PCN reaction that required hospitalization No Has patient had a PCN reaction occurring within the last 10 years: No If all of the above answers are "NO", then may proceed with Cephalosporin use.  Other reaction(s): Other (See Comments), Other (See Comments) Facial swelling Has patient had a PCN reaction causing immediate rash, facial/tongue/throat swelling, SOB or lightheadedness with hypotension: Yes Has patient had a PCN reaction causing severe rash involving mucus membranes or skin necrosis: Yes Has patient had a PCN reaction that required hospitalization No Has patient had a PCN reaction occurring within the last 10 years: No If all of the above answers are "NO", then may proceed with Cephalosporin use. Facial swelling Has patient had a PCN reaction causing immediate rash, facial/tongue/throat swelling, SOB or lightheadedness  with hypotension: Yes Has patient had a PCN reaction causing severe rash involving mucus membranes or skin necrosis: Yes Has patient had a PCN reaction that required hospitalization No Has patient had a PCN reaction occurring within the last 10 years: No If all of the above answers are "NO", then may proceed with Cephalosporin use. Facial swelling Has patient had a PCN reaction causing immediate rash, facial/tongue/throat swelling, SOB or lightheadedness with hypotension: Yes Has patient had a PCN reaction causing severe rash involving mucus membranes or skin necrosis: Yes Has patient had a PCN reaction that required hospitalization No Has patient had a PCN reaction occurring within the last 10 years: No If all of the above answers are "NO", then may proceed with Cephalosporin use.    Ambien [Zolpidem]     Hallucination    Aspirin Hives   Oxytetracycline Hives    Other reaction(s): Unknown     History of Present Illness    ***  Home Medications    Current Outpatient Medications  Medication Sig Dispense Refill   acetaminophen (TYLENOL) 325 MG tablet Take 2 tablets (650 mg total) by mouth every 6 (six) hours as needed for mild pain or moderate pain (or Fever >/= 101).     albuterol (PROVENTIL) (2.5 MG/3ML) 0.083% nebulizer solution Take 3 mLs (2.5 mg total) by nebulization every 4 (four) hours as needed for wheezing or shortness of breath. 75 mL 12   ALPRAZolam (XANAX) 0.5 MG tablet TAKE ONE-HALF TABLET BY MOUTH TWICE DAILY AS NEEDED FOR ANXIETY 60 tablet 1   benzonatate (TESSALON) 200 MG capsule Take 1 capsule (200 mg total) by mouth 3 (three) times daily as needed for cough. 30 capsule 1   buPROPion (WELLBUTRIN XL) 150 MG 24 hr tablet TAKE ONE TABLET BY MOUTH EVERY DAY 90 tablet 3   calcium-vitamin D (OSCAL WITH D) 500-200 MG-UNIT tablet Take 1 tablet by mouth 2 (two) times daily with a meal. 60 tablet 0   cholecalciferol (VITAMIN D) 25 MCG tablet Take 1 tablet (1,000 Units total)  by mouth 2 (two) times daily with a meal. 30 tablet 2   clobetasol cream (TEMOVATE) 8.31 % Apply 1 application topically 2 (two) times daily as needed (skin irritation).      colchicine 0.6 MG tablet Take 1 tablet (0.6 mg total) by mouth as needed. May repeat 1 pill  in 1 hr. No more than 3 total pills 1.8 mg in 24 hours as needed gout flare 30 tablet 2   diclofenac Sodium (VOLTAREN) 1 % GEL Apply 2 g topically 3 (three) times daily. 100 g 2   DULoxetine (CYMBALTA) 60 MG capsule TAKE 1 CAPSULE BY MOUTH ONCE DAILY 90 capsule 3   ELIQUIS 5 MG TABS tablet TAKE 1 TABLET BY MOUTH TWICE DAILY 180 tablet 1   ergocalciferol (VITAMIN D2) 1.25 MG (50000 UT) capsule Take 50,000 Units by mouth once a week.     estradiol (ESTRACE) 0.1 MG/GM vaginal cream Place 1 Applicatorful vaginally every Monday, Wednesday, and Friday. Apply 0.'5mg'$  (pea-sized amount)  just inside the vaginal introitus with a finger-tip on Monday, Wednesday and Friday nights, 42.5 g 11   fluconazole (DIFLUCAN) 150 MG tablet Take 1 tablet (150 mg total) by mouth once a week. X 2-4 weeks 4 tablet 0   Fluticasone-Umeclidin-Vilant (TRELEGY ELLIPTA) 200-62.5-25 MCG/ACT AEPB Inhale 1 puff into the lungs daily. 60 each 0   folic acid (FOLVITE) 1 MG tablet TAKE 1 TABLET BY MOUTH DAILY 90 tablet 3   furosemide (LASIX) 20 MG tablet Take 1 tablet (20 mg total) by mouth as directed. Take 1 tablet daily. May take additional 1 tablet If weight gain of greater than 2 pounds in 24 hours or 5 pounds in one week. 90 tablet 3   GEMTESA 75 MG TABS TAKE 1 TABLET BY MOUTH DAILY 30 tablet 11   HYDROcodone-acetaminophen (NORCO/VICODIN) 5-325 MG tablet Take 1 tablet by mouth 2 (two) times daily as needed for severe pain. Must last 30 days 45 tablet 0   [START ON 12/05/2021] HYDROcodone-acetaminophen (NORCO/VICODIN) 5-325 MG tablet Take 1 tablet by mouth 2 (two) times daily as needed for severe pain. Must last 30 days 45 tablet 0   [START ON 01/04/2022]  HYDROcodone-acetaminophen (NORCO/VICODIN) 5-325 MG tablet Take 1 tablet by mouth 2 (two) times daily as needed for severe pain. Must last 30 days 45 tablet 0   levocetirizine (XYZAL) 5 MG tablet TAKE ONE TABLET BY MOUTH AT BEDTIME AS NEEDED FOR ALLERGIES 90 tablet 3   levothyroxine (SYNTHROID) 137 MCG tablet Take 1 tablet (137 mcg total) by mouth daily before breakfast. 30 minutes do not mix with vitamins or acid reflux medications D/c 150 mcg 90 tablet 3   losartan (COZAAR) 50 MG tablet Take 1 tablet (50 mg total) by mouth daily. 90 tablet 3   metFORMIN (GLUCOPHAGE) 500 MG tablet Take 1 tablet (500 mg total) by mouth daily after supper. 90 tablet 3   metoprolol succinate (TOPROL-XL) 25 MG 24 hr tablet Take 0.5 tablets (12.5 mg total) by mouth daily. 45 tablet 3   Misc. Devices (TUB TRANSFER BOARD) MISC 1 Device by Does not apply route as needed. 1 each 0   montelukast (SINGULAIR) 10 MG tablet Take 1 tablet (10 mg total) by mouth at bedtime. 90 tablet 3   Multiple Vitamin (MULTIVITAMIN WITH MINERALS) TABS tablet Take 1 tablet by mouth daily.     mupirocin ointment (BACTROBAN) 2 % APPLY TOPICALLY TWICE DAILY TO RIGHT LOWER LEG AS DIRECTED. 22 g 0   nitrofurantoin, macrocrystal-monohydrate, (MACROBID) 100 MG capsule Take 1 capsule (100 mg total) by mouth 2 (two) times daily. Take with food. 10 capsule 0   nystatin cream (MYCOSTATIN) Apply 1 application. topically 2 (two) times daily. Angles of mouth or skin folds prn 30 g 11   ondansetron (ZOFRAN) 4 MG tablet Take 1 tablet (4 mg  total) by mouth every 8 (eight) hours as needed for nausea or vomiting. 90 tablet 1   pantoprazole (PROTONIX) 40 MG tablet Take 1 tablet (40 mg total) by mouth daily. 30 min before food 90 tablet 3   Powders (GOLD BOND NO MESS BODY POWDER) AERP Apply 1 application. topically daily as needed. Gold bond talc free powder 200 g 11   pregabalin (LYRICA) 50 MG capsule TAKE 1 CAPSULE BY MOUTH 3 TIMES DAILY 90 capsule 2   rosuvastatin  (CRESTOR) 20 MG tablet TAKE 1 TABLET BY  MOUTH ONCE DAILY 30 tablet 0   sitaGLIPtin (JANUVIA) 50 MG tablet Take 1 tablet (50 mg total) by mouth daily. 90 tablet 3   sucralfate (CARAFATE) 1 GM/10ML suspension Take 1 g by mouth 3 (three) times daily.      thiamine 100 MG tablet Take 0.5 tablets (50 mg total) by mouth 2 (two) times daily with a meal. 60 tablet 2   No current facility-administered medications for this visit.     Review of Systems    ***.  All other systems reviewed and are otherwise negative except as noted above.    Physical Exam    VS:  There were no vitals taken for this visit. , BMI There is no height or weight on file to calculate BMI.     GEN: Well nourished, well developed, in no acute distress. HEENT: normal. Neck: Supple, no JVD, carotid bruits, or masses. Cardiac: RRR, no murmurs, rubs, or gallops. No clubbing, cyanosis, edema.  Radials/DP/PT 2+ and equal bilaterally.  Respiratory:  Respirations regular and unlabored, clear to auscultation bilaterally. GI: Soft, nontender, nondistended, BS + x 4. MS: no deformity or atrophy. Skin: warm and dry, no rash. Neuro:  Strength and sensation are intact. Psych: Normal affect.  Accessory Clinical Findings    ECG personally reviewed by me today - *** - no acute changes.  Lab Results  Component Value Date   WBC 7.0 10/23/2021   HGB 11.9 (L) 10/23/2021   HCT 37.2 10/23/2021   MCV 92.1 10/23/2021   PLT 203 10/23/2021   Lab Results  Component Value Date   CREATININE 1.26 (H) 07/30/2021   BUN 31 (H) 07/30/2021   NA 132 (L) 07/30/2021   K 4.3 07/30/2021   CL 95 (L) 07/30/2021   CO2 31 07/30/2021   Lab Results  Component Value Date   ALT 9 03/25/2021   AST 14 (L) 03/25/2021   ALKPHOS 94 03/25/2021   BILITOT 0.3 03/25/2021   Lab Results  Component Value Date   CHOL 88 05/27/2021   HDL 55.50 05/27/2021   LDLCALC 18 05/27/2021   TRIG 70.0 05/27/2021   CHOLHDL 2 05/27/2021    Lab Results  Component  Value Date   HGBA1C 6.9 (H) 06/07/2021    Assessment & Plan    1.  ***   Murray Hodgkins, NP 11/26/2021, 12:15 PM

## 2021-11-27 ENCOUNTER — Encounter: Payer: Self-pay | Admitting: Internal Medicine

## 2021-11-27 ENCOUNTER — Ambulatory Visit (INDEPENDENT_AMBULATORY_CARE_PROVIDER_SITE_OTHER): Payer: Medicare HMO | Admitting: Internal Medicine

## 2021-11-27 VITALS — BP 138/70 | HR 101 | Temp 98.1°F | Ht 66.0 in | Wt 270.0 lb

## 2021-11-27 DIAGNOSIS — E785 Hyperlipidemia, unspecified: Secondary | ICD-10-CM

## 2021-11-27 DIAGNOSIS — M797 Fibromyalgia: Secondary | ICD-10-CM

## 2021-11-27 DIAGNOSIS — E611 Iron deficiency: Secondary | ICD-10-CM | POA: Diagnosis not present

## 2021-11-27 DIAGNOSIS — Z23 Encounter for immunization: Secondary | ICD-10-CM | POA: Diagnosis not present

## 2021-11-27 DIAGNOSIS — M778 Other enthesopathies, not elsewhere classified: Secondary | ICD-10-CM

## 2021-11-27 DIAGNOSIS — G8929 Other chronic pain: Secondary | ICD-10-CM

## 2021-11-27 DIAGNOSIS — Z6841 Body Mass Index (BMI) 40.0 and over, adult: Secondary | ICD-10-CM

## 2021-11-27 DIAGNOSIS — E559 Vitamin D deficiency, unspecified: Secondary | ICD-10-CM | POA: Diagnosis not present

## 2021-11-27 DIAGNOSIS — R7989 Other specified abnormal findings of blood chemistry: Secondary | ICD-10-CM | POA: Diagnosis not present

## 2021-11-27 DIAGNOSIS — M25512 Pain in left shoulder: Secondary | ICD-10-CM | POA: Diagnosis not present

## 2021-11-27 DIAGNOSIS — N1832 Chronic kidney disease, stage 3b: Secondary | ICD-10-CM

## 2021-11-27 DIAGNOSIS — I10 Essential (primary) hypertension: Secondary | ICD-10-CM

## 2021-11-27 DIAGNOSIS — I152 Hypertension secondary to endocrine disorders: Secondary | ICD-10-CM

## 2021-11-27 DIAGNOSIS — N390 Urinary tract infection, site not specified: Secondary | ICD-10-CM | POA: Diagnosis not present

## 2021-11-27 DIAGNOSIS — E66813 Obesity, class 3: Secondary | ICD-10-CM

## 2021-11-27 DIAGNOSIS — K219 Gastro-esophageal reflux disease without esophagitis: Secondary | ICD-10-CM

## 2021-11-27 DIAGNOSIS — M19012 Primary osteoarthritis, left shoulder: Secondary | ICD-10-CM

## 2021-11-27 DIAGNOSIS — F339 Major depressive disorder, recurrent, unspecified: Secondary | ICD-10-CM

## 2021-11-27 DIAGNOSIS — E039 Hypothyroidism, unspecified: Secondary | ICD-10-CM

## 2021-11-27 DIAGNOSIS — M7582 Other shoulder lesions, left shoulder: Secondary | ICD-10-CM

## 2021-11-27 DIAGNOSIS — F32A Depression, unspecified: Secondary | ICD-10-CM

## 2021-11-27 DIAGNOSIS — N3000 Acute cystitis without hematuria: Secondary | ICD-10-CM

## 2021-11-27 DIAGNOSIS — J4540 Moderate persistent asthma, uncomplicated: Secondary | ICD-10-CM

## 2021-11-27 DIAGNOSIS — E871 Hypo-osmolality and hyponatremia: Secondary | ICD-10-CM | POA: Diagnosis not present

## 2021-11-27 DIAGNOSIS — E1142 Type 2 diabetes mellitus with diabetic polyneuropathy: Secondary | ICD-10-CM

## 2021-11-27 DIAGNOSIS — E1159 Type 2 diabetes mellitus with other circulatory complications: Secondary | ICD-10-CM | POA: Diagnosis not present

## 2021-11-27 DIAGNOSIS — G894 Chronic pain syndrome: Secondary | ICD-10-CM

## 2021-11-27 DIAGNOSIS — F418 Other specified anxiety disorders: Secondary | ICD-10-CM

## 2021-11-27 DIAGNOSIS — E538 Deficiency of other specified B group vitamins: Secondary | ICD-10-CM

## 2021-11-27 DIAGNOSIS — J309 Allergic rhinitis, unspecified: Secondary | ICD-10-CM

## 2021-11-27 DIAGNOSIS — F419 Anxiety disorder, unspecified: Secondary | ICD-10-CM

## 2021-11-27 NOTE — Patient Instructions (Addendum)
Biotene mouthwash  Sugar free gum   Dr. Mack Guise  Phone Fax E-mail Address  [phone] Not available [place] [id number]     Specialties     Orthopedic Surgery      Semaglutide Injection/ozempic  What is this medication? SEMAGLUTIDE (SEM a GLOO tide) treats type 2 diabetes. It works by increasing insulin levels in your body, which decreases your blood sugar (glucose). It also reduces the amount of sugar released into the blood and slows down your digestion. It can also be used to lower the risk of heart attack and stroke in people with type 2 diabetes. Changes to diet and exercise are often combined with this medication. This medicine may be used for other purposes; ask your health care provider or pharmacist if you have questions. COMMON BRAND NAME(S): OZEMPIC What should I tell my care team before I take this medication? They need to know if you have any of these conditions: Endocrine tumors (MEN 2) or if someone in your family had these tumors Eye disease, vision problems History of pancreatitis Kidney disease Stomach problems Thyroid cancer or if someone in your family had thyroid cancer An unusual or allergic reaction to semaglutide, other medications, foods, dyes, or preservatives Pregnant or trying to get pregnant Breast-feeding How should I use this medication? This medication is for injection under the skin of your upper leg (thigh), stomach area, or upper arm. It is given once every week (every 7 days). You will be taught how to prepare and give this medication. Use exactly as directed. Take your medication at regular intervals. Do not take it more often than directed. If you use this medication with insulin, you should inject this medication and the insulin separately. Do not mix them together. Do not give the injections right next to each other. Change (rotate) injection sites with each injection. It is important that you put your used  needles and syringes in a special sharps container. Do not put them in a trash can. If you do not have a sharps container, call your pharmacist or care team to get one. A special MedGuide will be given to you by the pharmacist with each prescription and refill. Be sure to read this information carefully each time. This medication comes with INSTRUCTIONS FOR USE. Ask your pharmacist for directions on how to use this medication. Read the information carefully. Talk to your pharmacist or care team if you have questions. Talk to your care team about the use of this medication in children. Special care may be needed. Overdosage: If you think you have taken too much of this medicine contact a poison control center or emergency room at once. NOTE: This medicine is only for you. Do not share this medicine with others. What if I miss a dose? If you miss a dose, take it as soon as you can within 5 days after the missed dose. Then take your next dose at your regular weekly time. If it has been longer than 5 days after the missed dose, do not take the missed dose. Take the next dose at your regular time. Do not take double or extra doses. If you have questions about a missed dose, contact your care team for advice. What may interact with this medication? Other medications for diabetes Many medications may cause changes in blood sugar, these include: Alcohol containing beverages Antiviral medications for HIV or AIDS Aspirin and aspirin-like medications Certain medications for blood pressure, heart disease,  irregular heart beat Chromium Diuretics Female hormones, such as estrogens or progestins, birth control pills Fenofibrate Gemfibrozil Isoniazid Lanreotide Female hormones or anabolic steroids MAOIs like Carbex, Eldepryl, Marplan, Nardil, and Parnate Medications for weight loss Medications for allergies, asthma, cold, or cough Medications for depression, anxiety, or psychotic  disturbances Niacin Nicotine NSAIDs, medications for pain and inflammation, like ibuprofen or naproxen Octreotide Pasireotide Pentamidine Phenytoin Probenecid Quinolone antibiotics such as ciprofloxacin, levofloxacin, ofloxacin Some herbal dietary supplements Steroid medications such as prednisone or cortisone Sulfamethoxazole; trimethoprim Thyroid hormones Some medications can hide the warning symptoms of low blood sugar (hypoglycemia). You may need to monitor your blood sugar more closely if you are taking one of these medications. These include: Beta-blockers, often used for high blood pressure or heart problems (examples include atenolol, metoprolol, propranolol) Clonidine Guanethidine Reserpine This list may not describe all possible interactions. Give your health care provider a list of all the medicines, herbs, non-prescription drugs, or dietary supplements you use. Also tell them if you smoke, drink alcohol, or use illegal drugs. Some items may interact with your medicine. What should I watch for while using this medication? Visit your care team for regular checks on your progress. Drink plenty of fluids while taking this medication. Check with your care team if you get an attack of severe diarrhea, nausea, and vomiting. The loss of too much body fluid can make it dangerous for you to take this medication. A test called the HbA1C (A1C) will be monitored. This is a simple blood test. It measures your blood sugar control over the last 2 to 3 months. You will receive this test every 3 to 6 months. Learn how to check your blood sugar. Learn the symptoms of low and high blood sugar and how to manage them. Always carry a quick-source of sugar with you in case you have symptoms of low blood sugar. Examples include hard sugar candy or glucose tablets. Make sure others know that you can choke if you eat or drink when you develop serious symptoms of low blood sugar, such as seizures or  unconsciousness. They must get medical help at once. Tell your care team if you have high blood sugar. You might need to change the dose of your medication. If you are sick or exercising more than usual, you might need to change the dose of your medication. Do not skip meals. Ask your care team if you should avoid alcohol. Many nonprescription cough and cold products contain sugar or alcohol. These can affect blood sugar. Pens should never be shared. Even if the needle is changed, sharing may result in passing of viruses like hepatitis or HIV. Wear a medical ID bracelet or chain, and carry a card that describes your disease and details of your medication and dosage times. Do not become pregnant while taking this medication. Women should inform their care team if they wish to become pregnant or think they might be pregnant. There is a potential for serious side effects to an unborn child. Talk to your care team for more information. What side effects may I notice from receiving this medication? Side effects that you should report to your care team as soon as possible: Allergic reactions--skin rash, itching, hives, swelling of the face, lips, tongue, or throat Change in vision Dehydration--increased thirst, dry mouth, feeling faint or lightheaded, headache, dark yellow or brown urine Gallbladder problems--severe stomach pain, nausea, vomiting, fever Heart palpitations--rapid, pounding, or irregular heartbeat Kidney injury--decrease in the amount of urine, swelling of the  ankles, hands, or feet Pancreatitis--severe stomach pain that spreads to your back or gets worse after eating or when touched, fever, nausea, vomiting Thyroid cancer--new mass or lump in the neck, pain or trouble swallowing, trouble breathing, hoarseness Side effects that usually do not require medical attention (report to your care team if they continue or are bothersome): Diarrhea Loss of appetite Nausea Stomach  pain Vomiting This list may not describe all possible side effects. Call your doctor for medical advice about side effects. You may report side effects to FDA at 1-800-FDA-1088. Where should I keep my medication? Keep out of the reach of children. Store unopened pens in a refrigerator between 2 and 8 degrees C (36 and 46 degrees F). Do not freeze. Protect from light and heat. After you first use the pen, it can be stored for 56 days at room temperature between 15 and 30 degrees C (59 and 86 degrees F) or in a refrigerator. Throw away your used pen after 56 days or after the expiration date, whichever comes first. Do not store your pen with the needle attached. If the needle is left on, medication may leak from the pen. NOTE: This sheet is a summary. It may not cover all possible information. If you have questions about this medicine, talk to your doctor, pharmacist, or health care provider.  2023 [employer] Standard (2020-05-23 00:00:00)

## 2021-11-27 NOTE — Progress Notes (Unsigned)
No chief complaint on file.  HPI ROS Past Medical History:  Diagnosis Date   Abnormal antibody titer    Anxiety and depression    Arthritis    Asthma    Atypical chest pain    a. 03/2018 MV: No ischemia/infarct. EF >65%.   Carotid arterial disease ([place])    a. 0/1601 U/S: RICA <09, LICA nl.   Chronic heart failure with preserved ejection fraction (HFpEF) ([place])    a. 08/2013 Echo: EF 60-65%; b. 08/2019 Echo: EF 60-65%; c. 03/2020 Echo: EF 60-65%; d. 05/2021 Echo: EF 55-60%, no rmwa, nl RV fxn, RVSP 54.27mHg. Mildly dil LA, mild MR, mod TR.   CKD (chronic kidney disease), stage III (HCC)    Cystocele    Depression    Diabetes ([place]    DVT (deep venous thrombosis) (HCC)    Righ calf   Dyspnea    11/08/2018 - "more now due to lack of exercise"   Edema    GERD (gastroesophageal reflux disease)    Heart murmur    History of IBS    History of kidney stones    History of methicillin resistant staphylococcus aureus (MRSA) 2008   History of multiple strokes    Hyperlipidemia    Hypertension    Hypothyroidism    Insomnia    Kidney stone    kidney stones with lithotripsy .  [facility]ney- "adrenal glands"   Mitral regurgitation    a. 05/2021 Echo: Mild MR.   Neuropathy involving both lower extremities    Non-healing ulcer of left foot (HCC)    Obesity, Class II, BMI 35-39.9, with comorbidity    PAF (paroxysmal atrial fibrillation) ([place]    a. 07/2013 Event monitor: Freq PVCs and 3 short runs of Afib-->CHA2DS2VASc = 7-->Eliquis.   PAH (pulmonary artery hypertension) ([place]    a. 05/2021 Echo: RVSP 54.469mg.   Peripheral vascular disease ([place]   a. 03/2018 LE Angio: Nl renal arteries. Nl aorta & iliacs. Nl LLE arterial flow; b. 06/2020 ABI's: Nl bilat.   Sleep apnea    use C-PAP   Stroke ([place]   03/2020   Tricuspid regurgitation    a. 05/2021 Echo: Mod TR (in setting of RVSP 54.51m39m).   Urinary incontinence    Venous stasis dermatitis of both lower extremities    Past Surgical  History:  Procedure Laterality Date   ANKLE SURGERY Right    APPLICATION OF WOUND VAC Left 06/27/2015   Procedure: APPLICATION OF WOUND VAC ( POSSIBLE ) ;  Surgeon: JasAlgernon HuxleyD;  Location: [facility];  Service: Vascular;  Laterality: Left;   APPLICATION OF WOUND VAC Left 09/25/2016   Procedure: APPLICATION OF WOUND VAC;  Surgeon: TroAlbertine PatriciaPM;  Location: [facility];  Service: Podiatry;  Laterality: Left;   arm surgery     right     CHOLECYSTECTOMY     COLONOSCOPY     COLONOSCOPY WITH PROPOFOL N/A 01/11/2017   Procedure: COLONOSCOPY WITH PROPOFOL;  Surgeon: SkuLollie SailsD;  [facility];  Service: Endoscopy;  Laterality: N/A;   COLONOSCOPY WITH PROPOFOL N/A 02/22/2017   Procedure: COLONOSCOPY WITH PROPOFOL;  Surgeon: SkuLollie SailsD;  [facility];  Service: Endoscopy;  Laterality: N/A;   COLONOSCOPY WITH PROPOFOL N/A 05/31/2017   Procedure: COLONOSCOPY WITH PROPOFOL;  Surgeon: SkuLollie SailsD;  [facility];  Service: Endoscopy;  Laterality: N/A;   ESOPHAGOGASTRODUODENOSCOPY (EGD) WITH PROPOFOL N/A 01/11/2017   Procedure: ESOPHAGOGASTRODUODENOSCOPY (EGD)  WITH PROPOFOL;  Surgeon: [name], MD;  Location: [facility] ENDOSCOPY;  Service: Endoscopy;  Laterality: N/A;   ESOPHAGOGASTRODUODENOSCOPY (EGD) WITH PROPOFOL N/A 10/25/2018   Procedure: ESOPHAGOGASTRODUODENOSCOPY (EGD) WITH PROPOFOL;  Surgeon: [name], MD;  Location: [facility] ENDOSCOPY;  Service: Endoscopy;  Laterality: N/A;   ESOPHAGOGASTRODUODENOSCOPY (EGD) WITH PROPOFOL N/A 11/29/2019   Procedure: ESOPHAGOGASTRODUODENOSCOPY (EGD) WITH PROPOFOL;  Surgeon: [place], Benay Pike, MD;  Location: [facility] ENDOSCOPY;  Service: Gastroenterology;  Laterality: N/A;   HERNIA REPAIR     umbilical   HIATAL HERNIA REPAIR     I & D EXTREMITY Left 06/27/2015   Procedure: IRRIGATION AND DEBRIDEMENT EXTREMITY            ( CALF HEMATOMA ) POSSIBLE WOUND VAC;  Surgeon: [name], MD;  Location:  [facility];  Service: Vascular;  Laterality: Left;   INCISION AND DRAINAGE OF WOUND Left 09/25/2016   Procedure: IRRIGATION AND DEBRIDEMENT - PARTIAL RESECTION OF ACHILLES TENDON WITH WOUND VAC APPLICATION;  Surgeon: [name], DPM;  Location: [facility];  Service: Podiatry;  Laterality: Left;   INCISION AND DRAINAGE OF WOUND Left 11/09/2018   Procedure: Excision of left foot wound with ACell placement;  Surgeon: Wallace Going, DO;  Location: [place];  Service: Plastics;  Laterality: Left;   IRRIGATION AND DEBRIDEMENT ABSCESS Left 09/07/2016   Procedure: IRRIGATION AND DEBRIDEMENT ABSCESS LEFT HEEL;  Surgeon: [name], DPM;  Location: [facility];  Service: Podiatry;  Laterality: Left;   JOINT REPLACEMENT  2013   left knee replacement   KIDNEY SURGERY Right    kidney stones   LITHOTRIPSY     LOWER EXTREMITY ANGIOGRAPHY Left 04/04/2018   Procedure: LOWER EXTREMITY ANGIOGRAPHY;  Surgeon: [name], MD;  Location: [place] [facility];  Service: Cardiovascular;  Laterality: Left;   NM GATED MYOVIEW [facility] HX)  February 2017   Likely breast attenuation. LOW RISK study. Normal EF 55-60%.   OTHER SURGICAL HISTORY     08/2016 or 09/2016 surgery on achilles tenso h/o staph infection heal. Dr. [name]   removal of left hematoma Left    leg   REVERSE SHOULDER ARTHROPLASTY Right 10/08/2015   Procedure: REVERSE SHOULDER ARTHROPLASTY;  Surgeon: [name], MD;  Location: [facility];  Service: Orthopedics;  Laterality: Right;   SLEEVE GASTROPLASTY     TONSILLECTOMY     TOTAL KNEE ARTHROPLASTY Left    TOTAL SHOULDER REPLACEMENT Right    TRANSESOPHAGEAL ECHOCARDIOGRAM  08/08/2013   Mild LVH, EF 60-65%. Moderate LA dilation and mild RA dilation. Mild MR with no evidence of stenosis and no evidence of endocarditis. A false chordae is noted.   TRANSTHORACIC ECHOCARDIOGRAM  08/03/2013   Mild-Moderate concentric LVH, EF 60-65%. Normal diastolic function. Mild LA dilation. Mild MR with possible  vegetation  - not confirmed on TEE    ULNAR NERVE TRANSPOSITION Right 08/06/2016   Procedure: ULNAR NERVE DECOMPRESSION/TRANSPOSITION;  Surgeon: [name], MD;  Location: [facility];  Service: Orthopedics;  Laterality: Right;   UPPER GI ENDOSCOPY     VAGINAL HYSTERECTOMY     Family History  Problem Relation Age of Onset   Skin cancer Father    Diabetes Father    Hypertension Father    Peripheral vascular disease Father    Cancer Father    Cerebral aneurysm Father    Alcohol abuse Father    Varicose Veins Mother    Kidney disease Mother    Arthritis Mother    Mental illness  Sister    Cancer Maternal Aunt        breast   Breast cancer Maternal Aunt    Arthritis Maternal Grandmother    Hypertension Maternal Grandmother    Diabetes Maternal Grandmother    Arthritis Maternal Grandfather    Heart disease Maternal Grandfather    Hypertension Maternal Grandfather    Arthritis Paternal Grandmother    Hypertension Paternal Grandmother    Diabetes Paternal Grandmother    Arthritis Paternal Grandfather    Hypertension Paternal Grandfather    Bladder Cancer Neg Hx    Kidney cancer Neg Hx    Social History   Socioeconomic History   Marital status: Married    Spouse name: Not on file   Number of children: Not on file   Years of education: Not on file   Highest education level: Not on file  Occupational History   Not on file  Tobacco Use   Smoking status: Never   Smokeless tobacco: Never  Vaping Use   Vaping Use: Never used  Substance and Sexual Activity   Alcohol use: No    Alcohol/week: 0.0 standard drinks of alcohol   Drug use: Never   Sexual activity: Not Currently  Other Topics Concern   Not on file  Social History Narrative   She is currently married -- for 30+ years. Does not work. Does not smoke or take alcohol. She never smoked. She exercises at least 3 days a week since before her gastric surgery.   Marital status reviewed in history of present illness.   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    Social Determinants of Health   Financial Resource Strain: Low Risk  (05/08/2020)   Overall Financial Resource Strain (CARDIA)    Difficulty of Paying Living Expenses: Not very hard  Food Insecurity: No Food Insecurity (05/08/2020)   Hunger Vital Sign    Worried About Running Out of Food in the Last Year: Never true    Ran Out of Food in the Last Year: Never true  Transportation Needs: Unmet Transportation Needs (05/07/2020)   PRAPARE - Hydrologist (Medical): Yes    Lack of Transportation (Non-Medical): Yes  Physical Activity: Not on file  Stress: Not on file  Social Connections: Not on file  Intimate Partner Violence: Not on file   No outpatient medications have been marked as taking for the 11/27/21 encounter (Appointment) with [name], [name], MD.   Allergies  Allergen Reactions   Morphine And Related Other (See Comments) and Nausea Only    Patient becomes very confused Other reaction(s): Other (See Comments), Other (See Comments) Patient becomes very confused Patient becomes very confused    Penicillins Hives, Shortness Of Breath, Swelling and Other (See Comments)    Facial swelling Has patient had a PCN reaction causing immediate rash, facial/tongue/throat swelling, SOB or lightheadedness with hypotension: Yes Has patient had a PCN reaction causing severe rash involving mucus membranes or skin necrosis: Yes Has patient had a PCN reaction that required hospitalization No Has patient had a PCN reaction occurring within the last 10 years: No If all of the above answers are "NO", then may proceed with Cephalosporin use.  Other reaction(s): Other (See Comments), Other (See Comments) Facial swelling Has patient had a PCN reaction causing immediate rash, facial/tongue/throat swelling, SOB or lightheadedness with hypotension: Yes Has patient had a PCN  reaction causing severe  rash involving mucus membranes or skin necrosis: Yes Has patient had a PCN reaction that required hospitalization No Has patient had a PCN reaction occurring within the last 10 years: No If all of the above answers are "NO", then may proceed with Cephalosporin use. Facial swelling Has patient had a PCN reaction causing immediate rash, facial/tongue/throat swelling, SOB or lightheadedness with hypotension: Yes Has patient had a PCN reaction causing severe rash involving mucus membranes or skin necrosis: Yes Has patient had a PCN reaction that required hospitalization No Has patient had a PCN reaction occurring within the last 10 years: No If all of the above answers are "NO", then may proceed with Cephalosporin use. Facial swelling Has patient had a PCN reaction causing immediate rash, facial/tongue/throat swelling, SOB or lightheadedness with hypotension: Yes Has patient had a PCN reaction causing severe rash involving mucus membranes or skin necrosis: Yes Has patient had a PCN reaction that required hospitalization No Has patient had a PCN reaction occurring within the last 10 years: No If all of the above answers are "NO", then may proceed with Cephalosporin use.    Ambien [Zolpidem]     Hallucination    Aspirin Hives   Oxytetracycline Hives    Other reaction(s): Unknown    Recent Results (from the past 2160 hour(s))  Ferritin     Status: None   Collection Time: 10/23/21  1:28 PM  Result Value Ref Range   Ferritin 45 11 - 307 ng/mL    Comment: Performed at [facility], [address] [place], [place] [address]  Iron and TIBC     Status: None   Collection Time: 10/23/21  1:28 PM  Result Value Ref Range   Iron 79 28 - 170 ug/dL   TIBC 396 250 - 450 ug/dL   Saturation Ratios 20 10.4 - 31.8 %   UIBC 317 ug/dL    Comment: Performed at [facility], [address] [place], [place] [address]  CBC with Differential/Platelet     Status:  Abnormal   Collection Time: 10/23/21  1:28 PM  Result Value Ref Range   WBC 7.0 4.0 - 10.5 K/uL   RBC 4.04 3.87 - 5.11 MIL/uL   Hemoglobin 11.9 (L) 12.0 - 15.0 g/dL   HCT 37.2 36.0 - 46.0 %   MCV 92.1 80.0 - 100.0 fL   MCH 29.5 26.0 - 34.0 pg   MCHC 32.0 30.0 - 36.0 g/dL   RDW 14.3 11.5 - 15.5 %   Platelets 203 150 - 400 K/uL   nRBC 0.0 0.0 - 0.2 %   Neutrophils Relative % 69 %   Neutro Abs 4.9 1.7 - 7.7 K/uL   Lymphocytes Relative 16 %   Lymphs Abs 1.1 0.7 - 4.0 K/uL   Monocytes Relative 10 %   Monocytes Absolute 0.7 0.1 - 1.0 K/uL   Eosinophils Relative 4 %   Eosinophils Absolute 0.3 0.0 - 0.5 K/uL   Basophils Relative 1 %   Basophils Absolute 0.1 0.0 - 0.1 K/uL   Immature Granulocytes 0 %   Abs Immature Granulocytes 0.02 0.00 - 0.07 K/uL    Comment: Performed at [facility], [address] [place], [place] [address]  ToxASSURE Select 13 (MW), Urine     Status: None   Collection Time: 10/29/21  2:34 PM  Result Value Ref Range   Summary Note     Comment: ==================================================================== ToxASSURE Select 13 (MW) ==================================================================== Test  Result       Flag       Units  Drug Present and Declared for Prescription Verification   Alprazolam                     45           EXPECTED   ng/mg creat   Alpha-hydroxyalprazolam        62           EXPECTED   ng/mg creat    Source of alprazolam is a scheduled prescription medication. Alpha-    hydroxyalprazolam is an expected metabolite of alprazolam.    Hydrocodone                    652          EXPECTED   ng/mg creat   Dihydrocodeine                 232          EXPECTED   ng/mg creat   Norhydrocodone                 537          EXPECTED   ng/mg creat    Sources of hydrocodone include scheduled prescription medications.    Dihydrocodeine and norhydrocodone are expected metabolites of    hydrocodone.  Dihydrocodeine is also available as a scheduled    prescription medica tion.  ==================================================================== Test                      Result    Flag   Units      Ref Range   Creatinine              65               mg/dL      >=20 ==================================================================== Declared Medications:  The flagging and interpretation on this report are based on the  following declared medications.  Unexpected results may arise from  inaccuracies in the declared medications.   **Note: The testing scope of this panel includes these medications:   Alprazolam (Xanax)  Hydrocodone (Norco)   **Note: The testing scope of this panel does not include the  following reported medications:   Acetaminophen (Tylenol)  Acetaminophen (Norco)  Albuterol (Proventil HFA)  Apixaban (Eliquis)  Benzonatate (Tessalon)  Bupropion (Wellbutrin XL)  Calcium  Cholecalciferol  Clobetasol (Temovate)  Colchicine  Duloxetine (Cymbalta)  Estradiol (Estrace)  Fluconazole (Diflucan)  Fluticasone (Trel egy)  Folic Acid  Furosemide (Lasix)  Levocetirizine (Xyzal)  Levothyroxine (Synthroid)  Losartan (Cozaar)  Metformin (Glucophage)  Metoprolol (Toprol)  Montelukast (Singulair)  Multivitamin  Mupirocin (Bactroban)  Nystatin (Mycostatin)  Ondansetron (Zofran)  Pantoprazole (Protonix)  Pregabalin (Lyrica)  Rosuvastatin (Crestor)  Sitagliptin (Januvia)  Sucralfate (Carafate)  Supplement  Topical  Topical Diclofenac (Voltaren)  Umeclidinium (Trelegy)  Vibegron (Gemtesa)  Vilanterol (Trelegy)  Vitamin B1  Vitamin D  Vitamin D2 ==================================================================== For clinical consultation, please call [phone]. ====================================================================   Urinalysis, Routine w reflex microscopic     Status: Abnormal   Collection Time: 11/13/21 12:41 PM  Result Value Ref  Range   Color, Urine YELLOW Yellow;Lt. Yellow;Straw;Dark Yellow;Amber;Green;Red;Brown   APPearance Cloudy (A) Clear;Turbid;Slightly Cloudy;Cloudy   Specific Gravity, Urine 1.010 1.000 - 1.030   pH 5.5 5.0 - 8.0   Total Protein, Urine NEGATIVE Negative   Urine Glucose NEGATIVE Negative   Ketones, ur  NEGATIVE Negative   Bilirubin Urine NEGATIVE Negative   Hgb urine dipstick NEGATIVE Negative   Urobilinogen, UA 0.2 0.0 - 1.0   Leukocytes,Ua MODERATE (A) Negative   Nitrite NEGATIVE Negative   WBC, UA 21-50/hpf (A) 0-2/hpf   RBC / HPF none seen 0-2/hpf   Squamous Epithelial / LPF Rare(0-4/hpf) Rare(0-4/hpf)   Bacteria, UA Many(>50/hpf) (A) None  Urine Culture     Status: Abnormal   Collection Time: 11/13/21 12:41 PM   Specimen: Urine  Result Value Ref Range   MICRO NUMBER: [id number]    SPECIMEN QUALITY: Adequate    Sample Source URINE    STATUS: FINAL    ISOLATE 1: Klebsiella pneumoniae (A)     Comment: Greater than 100,000 CFU/mL of Klebsiella pneumoniae      Susceptibility   Klebsiella pneumoniae - URINE CULTURE, REFLEX    AMOX/CLAVULANIC 4 Sensitive     AMPICILLIN >=32 Resistant     AMPICILLIN/SULBACTAM 8 Sensitive     CEFAZOLIN* <=4 Not Reportable      * For infections other than uncomplicated UTI caused by E. coli, K. pneumoniae or P. mirabilis: Cefazolin is resistant if MIC > or = 8 mcg/mL. (Distinguishing susceptible versus intermediate for isolates with MIC < or = 4 mcg/mL requires additional testing.) For uncomplicated UTI caused by E. coli, K. pneumoniae or P. mirabilis: Cefazolin is susceptible if MIC <32 mcg/mL and predicts susceptible to the oral agents cefaclor, cefdinir, cefpodoxime, cefprozil, cefuroxime, cephalexin and loracarbef.     CEFTAZIDIME <=1 Sensitive     CEFEPIME <=1 Sensitive     CEFTRIAXONE <=1 Sensitive     CIPROFLOXACIN <=0.25 Sensitive     LEVOFLOXACIN <=0.12 Sensitive     GENTAMICIN <=1 Sensitive     IMIPENEM 0.5 Sensitive      NITROFURANTOIN 64 Intermediate     PIP/TAZO <=4 Sensitive     TOBRAMYCIN <=1 Sensitive     TRIMETH/SULFA* <=20 Sensitive      * For infections other than uncomplicated UTI caused by E. coli, K. pneumoniae or P. mirabilis: Cefazolin is resistant if MIC > or = 8 mcg/mL. (Distinguishing susceptible versus intermediate for isolates with MIC < or = 4 mcg/mL requires additional testing.) For uncomplicated UTI caused by E. coli, K. pneumoniae or P. mirabilis: Cefazolin is susceptible if MIC <32 mcg/mL and predicts susceptible to the oral agents cefaclor, cefdinir, cefpodoxime, cefprozil, cefuroxime, cephalexin and loracarbef. Legend: S = Susceptible  I = Intermediate R = Resistant  NS = Not susceptible * = Not tested  NR = Not reported **NN = See antimicrobic comments   POCT Urinalysis Dipstick     Status: Abnormal   Collection Time: 11/13/21  2:28 PM  Result Value Ref Range   Color, UA yellow    Clarity, UA clear    Glucose, UA Negative Negative   Bilirubin, UA negative    Ketones, UA negative    Spec Grav, UA 1.015 1.010 - 1.025   Blood, UA negative    pH, UA 5.5 5.0 - 8.0   Protein, UA Positive (A) Negative   Urobilinogen, UA 0.2 0.2 or 1.0 E.U./dL   Nitrite, UA negative    Leukocytes, UA Large (3+) (A) Negative   Appearance     Odor     Objective  There is no height or weight on file to calculate BMI. Wt Readings from Last 3 Encounters:  11/13/21 273 lb 6.4 oz (124 kg)  10/29/21 259 lb (117.5 kg)  07/30/21 252 lb (114.3  kg)   Temp Readings from Last 3 Encounters:  10/29/21 (!) 97.3 F (36.3 C)  07/30/21 (!) 97.2 F (36.2 C) (Temporal)  06/23/21 (!) 96.9 F (36.1 C)   BP Readings from Last 3 Encounters:  11/13/21 118/64  10/29/21 (!) 143/62  07/30/21 116/60   Pulse Readings from Last 3 Encounters:  11/13/21 77  10/29/21 80  07/30/21 (!) 57    Physical Exam  Assessment  Plan  No diagnosis found.  HM Flu shot utd utd pna 23, prevnar  Tdap utd    consider shingrix in future rx prev still not done as of 05/2021 Given Rx twinrix in past to get at pharmacy h/o fatty liver  covid had 3/3 pfizer consider booster   DEXA 10/17/14 normal    Out of age window pap Ob/gyn    mammo neg 08/09/17 negative  -referred previously 08/17/19 negative 08/21/20 normal ordered     05/31/17 [facility] colonoscopy with diverticulosis and polyps multiple tubular and hyperplastic due in 2023   EGD had 10/25/2018 repeat due in 1 year [facility] Dr. Gustavo Lah had egd 11/29/19     Rec healthy diet and exercise     Provider: Dr. Olivia Mackie [name]-Internal Medicine

## 2021-11-28 LAB — COMPREHENSIVE METABOLIC PANEL
ALT: 15 U/L (ref 0–35)
AST: 16 U/L (ref 0–37)
Albumin: 4.2 g/dL (ref 3.5–5.2)
Alkaline Phosphatase: 80 U/L (ref 39–117)
BUN: 27 mg/dL — ABNORMAL HIGH (ref 6–23)
CO2: 25 mEq/L (ref 19–32)
Calcium: 9.5 mg/dL (ref 8.4–10.5)
Chloride: 101 mEq/L (ref 96–112)
Creatinine, Ser: 1.3 mg/dL — ABNORMAL HIGH (ref 0.40–1.20)
GFR: 41.16 mL/min — ABNORMAL LOW (ref >60.00)
Glucose, Bld: 151 mg/dL — ABNORMAL HIGH (ref 70–99)
Potassium: 5 mEq/L (ref 3.5–5.1)
Sodium: 139 mEq/L (ref 135–145)
Total Bilirubin: 0.5 mg/dL (ref 0.2–1.2)
Total Protein: 6.9 g/dL (ref 6.0–8.3)

## 2021-11-28 LAB — VITAMIN D 25 HYDROXY (VIT D DEFICIENCY, FRACTURES): VITD: 54.75 ng/mL (ref 30.00–100.00)

## 2021-11-28 LAB — IBC + FERRITIN
Ferritin: 28.6 ng/mL (ref 10.0–291.0)
Iron: 61 ug/dL (ref 42–145)
Saturation Ratios: 13.1 % — ABNORMAL LOW (ref 20.0–50.0)
TIBC: 466.2 ug/dL — ABNORMAL HIGH (ref 250.0–450.0)
Transferrin: 333 mg/dL (ref 212.0–360.0)

## 2021-11-28 LAB — FOLATE: Folate: >23.9 ng/mL (ref >5.9)

## 2021-11-28 LAB — URINE CULTURE
MICRO NUMBER:: 13885531
SPECIMEN QUALITY:: ADEQUATE

## 2021-11-28 LAB — TSH: TSH: 0.99 u[IU]/mL (ref 0.35–5.50)

## 2021-11-28 LAB — VITAMIN B12: Vitamin B-12: >1500 pg/mL — ABNORMAL HIGH (ref 211–911)

## 2021-11-28 LAB — HEMOGLOBIN A1C: Hgb A1c MFr Bld: 7.7 % — ABNORMAL HIGH (ref 4.6–6.5)

## 2021-11-30 ENCOUNTER — Other Ambulatory Visit: Payer: Self-pay | Admitting: Internal Medicine

## 2021-12-01 ENCOUNTER — Encounter: Payer: Self-pay | Admitting: Internal Medicine

## 2021-12-01 ENCOUNTER — Telehealth: Payer: Self-pay

## 2021-12-01 DIAGNOSIS — Z6841 Body Mass Index (BMI) 40.0 and over, adult: Secondary | ICD-10-CM | POA: Insufficient documentation

## 2021-12-01 DIAGNOSIS — N1832 Chronic kidney disease, stage 3b: Secondary | ICD-10-CM | POA: Insufficient documentation

## 2021-12-01 DIAGNOSIS — M7582 Other shoulder lesions, left shoulder: Secondary | ICD-10-CM | POA: Insufficient documentation

## 2021-12-01 DIAGNOSIS — M778 Other enthesopathies, not elsewhere classified: Secondary | ICD-10-CM | POA: Insufficient documentation

## 2021-12-01 DIAGNOSIS — M19012 Primary osteoarthritis, left shoulder: Secondary | ICD-10-CM | POA: Insufficient documentation

## 2021-12-01 MED ORDER — FOLIC ACID 1 MG PO TABS: 1.0000 mg | ORAL_TABLET | Freq: Every day | ORAL | 3 refills | Status: DC

## 2021-12-01 MED ORDER — LEVOCETIRIZINE DIHYDROCHLORIDE 5 MG PO TABS: ORAL_TABLET | ORAL | 3 refills | Status: DC

## 2021-12-01 MED ORDER — DULOXETINE HCL 60 MG PO CPEP: 60.0000 mg | ORAL_CAPSULE | Freq: Every day | ORAL | 3 refills | Status: DC

## 2021-12-01 MED ORDER — ALBUTEROL SULFATE (2.5 MG/3ML) 0.083% IN NEBU
2.5000 mg | INHALATION_SOLUTION | RESPIRATORY_TRACT | 12 refills | Status: DC | PRN
Start: 2021-12-01 — End: 2022-08-19

## 2021-12-01 MED ORDER — BUPROPION HCL ER (XL) 150 MG PO TB24
150.0000 mg | ORAL_TABLET | Freq: Every day | ORAL | 3 refills | Status: DC
Start: 2021-12-01 — End: 2023-02-23

## 2021-12-01 MED ORDER — PANTOPRAZOLE SODIUM 40 MG PO TBEC: 40.0000 mg | DELAYED_RELEASE_TABLET | Freq: Every day | ORAL | 3 refills | Status: DC

## 2021-12-01 MED ORDER — OZEMPIC (2 MG/DOSE) 8 MG/3ML ~~LOC~~ SOPN: 2.0000 mg | PEN_INJECTOR | SUBCUTANEOUS | 5 refills | Status: DC

## 2021-12-01 MED ORDER — LEVOTHYROXINE SODIUM 137 MCG PO TABS: 137.0000 ug | ORAL_TABLET | Freq: Every day | ORAL | 3 refills | Status: DC

## 2021-12-01 MED ORDER — SEMAGLUTIDE (1 MG/DOSE) 4 MG/3ML ~~LOC~~ SOPN: 1.0000 mg | PEN_INJECTOR | SUBCUTANEOUS | 0 refills | Status: DC

## 2021-12-01 MED ORDER — PREGABALIN 50 MG PO CAPS: ORAL_CAPSULE | ORAL | 2 refills | Status: DC

## 2021-12-01 MED ORDER — SEMAGLUTIDE(0.25 OR 0.5MG/DOS) 2 MG/3ML ~~LOC~~ SOPN: 0.2500 mg | PEN_INJECTOR | SUBCUTANEOUS | 1 refills | Status: DC

## 2021-12-01 MED ORDER — ALPRAZOLAM 0.25 MG PO TABS: 0.2500 mg | ORAL_TABLET | Freq: Two times a day (BID) | ORAL | 2 refills | Status: DC | PRN

## 2021-12-01 MED ORDER — MONTELUKAST SODIUM 10 MG PO TABS: 10.0000 mg | ORAL_TABLET | Freq: Every day | ORAL | 3 refills | Status: DC

## 2021-12-01 NOTE — Telephone Encounter (Signed)
LMOM for pt to CB in regards to labs 

## 2021-12-01 NOTE — Addendum Note (Signed)
Addended by: Orland Mustard on: 12/01/2021 09:38 PM   Modules accepted: Orders

## 2021-12-02 ENCOUNTER — Telehealth: Payer: Self-pay | Admitting: *Deleted

## 2021-12-02 NOTE — Patient Outreach (Signed)
  Care Coordination   Initial Visit Note   12/02/2021 Name: YAIRA BERNARDI MRN: [id number] DOB: 1950/04/05  CLARABELLE OSCARSON is a 71 y.o. year old adult who sees [name], [name], MD for primary care. I spoke with  [name] by phone today.  Patient requested call back.   Goals Addressed   None     SDOH assessments and interventions completed:  No     Care Coordination Interventions Activated:  Yes  Care Coordination Interventions:  Yes, provided   Follow up plan: Follow up call scheduled for [phone]    Encounter Outcome:  Pt. Request to Call [facility] [facility] [phone]

## 2021-12-03 ENCOUNTER — Encounter: Payer: Self-pay | Admitting: *Deleted

## 2021-12-03 ENCOUNTER — Ambulatory Visit: Payer: Medicare HMO | Admitting: Obstetrics and Gynecology

## 2021-12-03 ENCOUNTER — Telehealth: Payer: Self-pay | Admitting: *Deleted

## 2021-12-03 NOTE — Patient Outreach (Signed)
  Care Coordination   Follow Up Visit Note   12/03/2021 Name: [name] MRN: [id number] DOB: [birth year]  [name] is a 71 y.o. year old adult who sees [name], [name], MD for primary care. I spoke with  [name] by phone today. Per patient request that she be called back next week.   Follow up plan: Follow up call scheduled for [id number]  10:30  Encounter Outcome:  Pt. Request to Call [facility] [place] [facility] [phone]

## 2021-12-04 ENCOUNTER — Telehealth: Payer: Self-pay

## 2021-12-04 NOTE — Telephone Encounter (Signed)
Patient would like to speak with [place] about the reason for change in dose for her ALPRAZolam (XANAX) 0.25 MG tablet.

## 2021-12-08 NOTE — Telephone Encounter (Signed)
Prior instructions were to take 1/2 dose of 0.5 so this would equal 1 pill 81f0.25 so she does not have to cut the dose in 1/2

## 2021-12-11 ENCOUNTER — Ambulatory Visit: Payer: Self-pay | Admitting: *Deleted

## 2021-12-11 ENCOUNTER — Ambulatory Visit: Payer: Medicare HMO | Admitting: Medical

## 2021-12-11 NOTE — Patient Outreach (Signed)
  Care Coordination   12/11/2021 Name: [name] MRN: [id number] DOB: 1950/08/08   Care Coordination Outreach Attempts:  A second unsuccessful outreach was attempted today to offer the patient with information about available care coordination services as a benefit of their health plan.     Follow Up Plan:  Additional outreach attempts will be made to offer the patient care coordination information and services.   Encounter Outcome:  No Answer  Care Coordination Interventions Activated:  Yes   Care Coordination Interventions:  No, not indicated    [place] [facility] [phone]

## 2021-12-23 ENCOUNTER — Inpatient Hospital Stay: Payer: Medicare HMO | Attending: Oncology

## 2021-12-23 ENCOUNTER — Inpatient Hospital Stay (HOSPITAL_BASED_OUTPATIENT_CLINIC_OR_DEPARTMENT_OTHER): Payer: Medicare HMO | Admitting: Oncology

## 2021-12-23 ENCOUNTER — Encounter: Payer: Self-pay | Admitting: Oncology

## 2021-12-23 VITALS — BP 127/65 | HR 64 | Temp 98.8°F | Resp 18 | Wt 264.2 lb

## 2021-12-23 DIAGNOSIS — E1122 Type 2 diabetes mellitus with diabetic chronic kidney disease: Secondary | ICD-10-CM | POA: Insufficient documentation

## 2021-12-23 DIAGNOSIS — I130 Hypertensive heart and chronic kidney disease with heart failure and stage 1 through stage 4 chronic kidney disease, or unspecified chronic kidney disease: Secondary | ICD-10-CM | POA: Diagnosis not present

## 2021-12-23 DIAGNOSIS — I5032 Chronic diastolic (congestive) heart failure: Secondary | ICD-10-CM | POA: Insufficient documentation

## 2021-12-23 DIAGNOSIS — K219 Gastro-esophageal reflux disease without esophagitis: Secondary | ICD-10-CM | POA: Diagnosis not present

## 2021-12-23 DIAGNOSIS — J45909 Unspecified asthma, uncomplicated: Secondary | ICD-10-CM | POA: Insufficient documentation

## 2021-12-23 DIAGNOSIS — I2721 Secondary pulmonary arterial hypertension: Secondary | ICD-10-CM | POA: Insufficient documentation

## 2021-12-23 DIAGNOSIS — E785 Hyperlipidemia, unspecified: Secondary | ICD-10-CM | POA: Insufficient documentation

## 2021-12-23 DIAGNOSIS — Z7901 Long term (current) use of anticoagulants: Secondary | ICD-10-CM | POA: Diagnosis not present

## 2021-12-23 DIAGNOSIS — Z79899 Other long term (current) drug therapy: Secondary | ICD-10-CM | POA: Insufficient documentation

## 2021-12-23 DIAGNOSIS — D509 Iron deficiency anemia, unspecified: Secondary | ICD-10-CM | POA: Diagnosis not present

## 2021-12-23 DIAGNOSIS — Z791 Long term (current) use of non-steroidal anti-inflammatories (NSAID): Secondary | ICD-10-CM | POA: Diagnosis not present

## 2021-12-23 DIAGNOSIS — Z8673 Personal history of transient ischemic attack (TIA), and cerebral infarction without residual deficits: Secondary | ICD-10-CM | POA: Diagnosis not present

## 2021-12-23 DIAGNOSIS — E039 Hypothyroidism, unspecified: Secondary | ICD-10-CM | POA: Diagnosis not present

## 2021-12-23 DIAGNOSIS — I480 Paroxysmal atrial fibrillation: Secondary | ICD-10-CM | POA: Insufficient documentation

## 2021-12-23 DIAGNOSIS — E1151 Type 2 diabetes mellitus with diabetic peripheral angiopathy without gangrene: Secondary | ICD-10-CM | POA: Insufficient documentation

## 2021-12-23 DIAGNOSIS — G4730 Sleep apnea, unspecified: Secondary | ICD-10-CM | POA: Diagnosis not present

## 2021-12-23 DIAGNOSIS — Z86718 Personal history of other venous thrombosis and embolism: Secondary | ICD-10-CM | POA: Insufficient documentation

## 2021-12-23 DIAGNOSIS — E119 Type 2 diabetes mellitus without complications: Secondary | ICD-10-CM | POA: Insufficient documentation

## 2021-12-23 DIAGNOSIS — Z87442 Personal history of urinary calculi: Secondary | ICD-10-CM | POA: Insufficient documentation

## 2021-12-23 DIAGNOSIS — G4700 Insomnia, unspecified: Secondary | ICD-10-CM | POA: Insufficient documentation

## 2021-12-23 DIAGNOSIS — I081 Rheumatic disorders of both mitral and tricuspid valves: Secondary | ICD-10-CM | POA: Insufficient documentation

## 2021-12-23 DIAGNOSIS — D649 Anemia, unspecified: Secondary | ICD-10-CM | POA: Insufficient documentation

## 2021-12-23 LAB — CBC WITH DIFFERENTIAL/PLATELET
Abs Immature Granulocytes: 0.02 10*3/uL (ref 0.00–0.07)
Basophils Absolute: 0.1 10*3/uL (ref 0.0–0.1)
Basophils Relative: 1 %
Eosinophils Absolute: 0.2 10*3/uL (ref 0.0–0.5)
Eosinophils Relative: 3 %
HCT: 37.7 % (ref 36.0–46.0)
Hemoglobin: 12.2 g/dL (ref 12.0–15.0)
Immature Granulocytes: 0 %
Lymphocytes Relative: 18 %
Lymphs Abs: 1.1 10*3/uL (ref 0.7–4.0)
MCH: 28.7 pg (ref 26.0–34.0)
MCHC: 32.4 g/dL (ref 30.0–36.0)
MCV: 88.7 fL (ref 80.0–100.0)
Monocytes Absolute: 0.7 10*3/uL (ref 0.1–1.0)
Monocytes Relative: 10 %
Neutro Abs: 4.3 10*3/uL (ref 1.7–7.7)
Neutrophils Relative %: 68 %
Platelets: 186 10*3/uL (ref 150–400)
RBC: 4.25 MIL/uL (ref 3.87–5.11)
RDW: 13.1 % (ref 11.5–15.5)
WBC: 6.3 10*3/uL (ref 4.0–10.5)
nRBC: 0 % (ref 0.0–0.2)

## 2021-12-23 LAB — IRON AND TIBC
Iron: 54 ug/dL (ref 28–170)
Saturation Ratios: 15 % (ref 10.4–31.8)
TIBC: 370 ug/dL (ref 250–450)
UIBC: 316 ug/dL

## 2021-12-23 LAB — FERRITIN: Ferritin: 38 ng/mL (ref 11–307)

## 2021-12-23 NOTE — Progress Notes (Signed)
Pt states she has frequent visits to the bathroom even without tkaing her fluid pill. Along, with dealing with her shoulder and neuropathy pain but having idfficulty with pain clinic and pharmacy.

## 2021-12-23 NOTE — Progress Notes (Signed)
Hematology/Oncology Consult note [facility]  [phone] [phone]  Patient Care Team: [name], [name], MD as PCP - General (Internal Medicine) Wellington Hampshire, MD as PCP - Cardiology (Cardiology) [name], MD as Consulting Physician (Cardiology) Poggi, Marshall Cork, MD as Consulting Physician (Surgery) Sindy Guadeloupe, MD as Consulting Physician (Oncology)   Name of the patient: [name]  [id number]  1950/04/23   Date of visit: 12/23/21  Diagnosis-iron deficiency anemia  Chief complaint/ Reason for visit-routine follow-up of iron deficiency anemia  Heme/Onc history: Patient is a 71 year old female with a history of sleep apnea, bronchitis, diabetes CVA who has been referred for anemia.  Most recent CBC from June 2022 showed white cell count of 7.5, H&H of 11.1/34.5 with an MCV of 80.9 and a platelet count of 257.  Looking back at her prior CBCs her hemoglobin has been mostly between 9.5-10.5.  B12 levels in June was greater than 1550.  Folate was normal iron studies showed a low ferritin of 18 and iron saturation of 11%.  Patient currently reports fatigue which has been more than usual along with exertional shortness of breath.  Denies any blood loss in her stool or urine.  Denies any dark melanotic stools.  Last EGD was in September 2021 by Dr. Alice Reichert which showed possible candidiasis, benign-appearing esophageal stenosis and multiple gastric polyps which were negative for malignancy.  Last colonoscopy was in March 2019 which showed diverticulosis and polyps but no evidence of active bleeding.   Results of blood workroutine follow-up of anemia from 03/25/2021 were as follows: CBC showed white cell count of 7.6, H&H of 8.6/29.6 with an MCV of 78 and a platelet count of 239.  Ferritin levels were low at 6.  B12 level normal at 563.  No evidence of hemolysis.  Myeloma panel showed no evidence of M protein.  TSH was mildly low at  0.14.  Patient received IV iron Venofer in February 2023.    Interval history-patient currently complains of left shoulder pain which has been bothering her for the last few months.  Her range of motion is currently limited.  She hopes to undergo operative treatment for her shoulder if she can get medical clearance  ECOG PS- 2 Pain scale- 0   Review of systems- Review of Systems  Constitutional:  Positive for malaise/fatigue. Negative for chills, fever and weight loss.  HENT:  Negative for congestion, ear discharge and nosebleeds.   Eyes:  Negative for blurred vision.  Respiratory:  Negative for cough, hemoptysis, sputum production, shortness of breath and wheezing.   Cardiovascular:  Negative for chest pain, palpitations, orthopnea and claudication.  Gastrointestinal:  Negative for abdominal pain, blood in stool, constipation, diarrhea, heartburn, melena, nausea and vomiting.  Genitourinary:  Negative for dysuria, flank pain, frequency, hematuria and urgency.  Musculoskeletal:  Negative for back pain, joint pain (Left shoulder pain) and myalgias.  Skin:  Negative for rash.  Neurological:  Negative for dizziness, tingling, focal weakness, seizures, weakness and headaches.  Endo/Heme/Allergies:  Does not bruise/bleed easily.  Psychiatric/Behavioral:  Negative for depression and suicidal ideas. The patient does not have insomnia.       Allergies  Allergen Reactions   Morphine And Related Other (See Comments) and Nausea Only    Patient becomes very confused Other reaction(s): Other (See Comments), Other (See Comments) Patient becomes very confused Patient becomes very confused    Penicillins Hives, Shortness Of Breath, Swelling and Other (See Comments)  Facial swelling Has patient had a PCN reaction causing immediate rash, facial/tongue/throat swelling, SOB or lightheadedness with hypotension: Yes Has patient had a PCN reaction causing severe rash involving mucus membranes or skin  necrosis: Yes Has patient had a PCN reaction that required hospitalization No Has patient had a PCN reaction occurring within the last 10 years: No If all of the above answers are "NO", then may proceed with Cephalosporin use.  Other reaction(s): Other (See Comments), Other (See Comments) Facial swelling Has patient had a PCN reaction causing immediate rash, facial/tongue/throat swelling, SOB or lightheadedness with hypotension: Yes Has patient had a PCN reaction causing severe rash involving mucus membranes or skin necrosis: Yes Has patient had a PCN reaction that required hospitalization No Has patient had a PCN reaction occurring within the last 10 years: No If all of the above answers are "NO", then may proceed with Cephalosporin use. Facial swelling Has patient had a PCN reaction causing immediate rash, facial/tongue/throat swelling, SOB or lightheadedness with hypotension: Yes Has patient had a PCN reaction causing severe rash involving mucus membranes or skin necrosis: Yes Has patient had a PCN reaction that required hospitalization No Has patient had a PCN reaction occurring within the last 10 years: No If all of the above answers are "NO", then may proceed with Cephalosporin use. Facial swelling Has patient had a PCN reaction causing immediate rash, facial/tongue/throat swelling, SOB or lightheadedness with hypotension: Yes Has patient had a PCN reaction causing severe rash involving mucus membranes or skin necrosis: Yes Has patient had a PCN reaction that required hospitalization No Has patient had a PCN reaction occurring within the last 10 years: No If all of the above answers are "NO", then may proceed with Cephalosporin use.    Ambien [Zolpidem]     Hallucination    Aspirin Hives   Oxytetracycline Hives    Other reaction(s): Unknown      Past Medical History:  Diagnosis Date   Abnormal antibody titer    Anxiety and depression    Arthritis    Asthma    Atypical  chest pain    a. 03/2018 MV: No ischemia/infarct. EF >65%.   Carotid arterial disease ([place])    a. 0/7622 U/S: RICA <63, LICA nl.   Chronic heart failure with preserved ejection fraction (HFpEF) ([place])    a. 08/2013 Echo: EF 60-65%; b. 08/2019 Echo: EF 60-65%; c. 03/2020 Echo: EF 60-65%; d. 05/2021 Echo: EF 55-60%, no rmwa, nl RV fxn, RVSP 54.75mHg. Mildly dil LA, mild MR, mod TR.   CKD (chronic kidney disease), stage III (HCC)    Cystocele    Depression    Diabetes [place]    DVT (deep venous thrombosis) (HCC)    Righ calf   Dyspnea    11/08/2018 - "more now due to lack of exercise"   Edema    GERD (gastroesophageal reflux disease)    Heart murmur    History of IBS    History of kidney stones    History of methicillin resistant staphylococcus aureus (MRSA) 2008   History of multiple strokes    Hyperlipidemia    Hypertension    Hypothyroidism    Insomnia    Kidney stone    kidney stones with lithotripsy .  [facility]- "adrenal glands"   Mitral regurgitation    a. 05/2021 Echo: Mild MR.   Neuropathy involving both lower extremities    Non-healing ulcer of left foot (HCC)    Obesity, Class II, BMI 35-39.9, with  comorbidity    PAF (paroxysmal atrial fibrillation) ([place])    a. 07/2013 Event monitor: Freq PVCs and 3 short runs of Afib-->CHA2DS2VASc = 7-->Eliquis.   PAH (pulmonary artery hypertension) ([place])    a. 05/2021 Echo: RVSP 54.76mHg.   Peripheral vascular disease ([place]    a. 03/2018 LE Angio: Nl renal arteries. Nl aorta & iliacs. Nl LLE arterial flow; b. 06/2020 ABI's: Nl bilat.   Sleep apnea    use C-PAP   Stroke ([place]    03/2020   Tricuspid regurgitation    a. 05/2021 Echo: Mod TR (in setting of RVSP 54.58mg).   Urinary incontinence    Venous stasis dermatitis of both lower extremities      Past Surgical History:  Procedure Laterality Date   ANKLE SURGERY Right    APPLICATION OF WOUND VAC Left 06/27/2015   Procedure: APPLICATION OF WOUND VAC ( POSSIBLE ) ;  Surgeon: JaAlgernon HuxleyMD;  Location: [facility];  Service: Vascular;  Laterality: Left;   APPLICATION OF WOUND VAC Left 09/25/2016   Procedure: APPLICATION OF WOUND VAC;  Surgeon: TrAlbertine PatriciaDPM;  Location: [facility];  Service: Podiatry;  Laterality: Left;   arm surgery     right     CHOLECYSTECTOMY     COLONOSCOPY     COLONOSCOPY WITH PROPOFOL N/A 01/11/2017   Procedure: COLONOSCOPY WITH PROPOFOL;  Surgeon: SkLollie SailsMD;  [facility];  Service: Endoscopy;  Laterality: N/A;   COLONOSCOPY WITH PROPOFOL N/A 02/22/2017   Procedure: COLONOSCOPY WITH PROPOFOL;  Surgeon: SkLollie SailsMD;  [facility];  Service: Endoscopy;  Laterality: N/A;   COLONOSCOPY WITH PROPOFOL N/A 05/31/2017   Procedure: COLONOSCOPY WITH PROPOFOL;  Surgeon: SkLollie SailsMD;  [facility];  Service: Endoscopy;  Laterality: N/A;   ESOPHAGOGASTRODUODENOSCOPY (EGD) WITH PROPOFOL N/A 01/11/2017   Procedure: ESOPHAGOGASTRODUODENOSCOPY (EGD) WITH PROPOFOL;  Surgeon: SkLollie SailsMD;  [facility];  Service: Endoscopy;  Laterality: N/A;   ESOPHAGOGASTRODUODENOSCOPY (EGD) WITH PROPOFOL N/A 10/25/2018   Procedure: ESOPHAGOGASTRODUODENOSCOPY (EGD) WITH PROPOFOL;  Surgeon: SkLollie SailsMD;  [facility];  Service: Endoscopy;  Laterality: N/A;   ESOPHAGOGASTRODUODENOSCOPY (EGD) WITH PROPOFOL N/A 11/29/2019   Procedure: ESOPHAGOGASTRODUODENOSCOPY (EGD) WITH PROPOFOL;  Surgeon: Toledo, TeBenay PikeMD;  Location: [facility];  Service: Gastroenterology;  Laterality: N/A;   HERNIA REPAIR     umbilical   HIATAL HERNIA REPAIR     I & D EXTREMITY Left 06/27/2015   Procedure: IRRIGATION AND DEBRIDEMENT EXTREMITY            ( CALF HEMATOMA ) POSSIBLE WOUND VAC;  Surgeon: JaAlgernon HuxleyMD;  Location: [facility];  Service: Vascular;  Laterality: Left;   INCISION AND DRAINAGE OF WOUND Left 09/25/2016   Procedure: IRRIGATION AND DEBRIDEMENT - PARTIAL RESECTION OF ACHILLES  TENDON WITH WOUND VAC APPLICATION;  Surgeon: TrAlbertine PatriciaDPM;  Location: [facility];  Service: Podiatry;  Laterality: Left;   INCISION AND DRAINAGE OF WOUND Left 11/09/2018   Procedure: Excision of left foot wound with ACell placement;  Surgeon: DiWallace GoingDO;  [facility];  Laterality: Left;   IRRIGATION AND DEBRIDEMENT ABSCESS Left 09/07/2016   Procedure: IRRIGATION AND DEBRIDEMENT ABSCESS LEFT HEEL;  Surgeon: TrAlbertine PatriciaDPM;  Location: [facility];  Service: Podiatry;  Laterality: Left;   JOINT REPLACEMENT  2013   left knee replacement   KIDNEY SURGERY Right    kidney stones  LITHOTRIPSY     LOWER EXTREMITY ANGIOGRAPHY Left 04/04/2018   Procedure: LOWER EXTREMITY ANGIOGRAPHY;  Surgeon: [name], MD;  Location: [place] [facility];  Service: Cardiovascular;  Laterality: Left;   NM GATED MYOVIEW [facility] HX)  February 2017   Likely breast attenuation. LOW RISK study. Normal EF 55-60%.   OTHER SURGICAL HISTORY     08/2016 or 09/2016 surgery on achilles tenso h/o staph infection heal. Dr. [name]   removal of left hematoma Left    leg   REVERSE SHOULDER ARTHROPLASTY Right 10/08/2015   Procedure: REVERSE SHOULDER ARTHROPLASTY;  Surgeon: [name], MD;  Location: [facility];  Service: Orthopedics;  Laterality: Right;   SLEEVE GASTROPLASTY     TONSILLECTOMY     TOTAL KNEE ARTHROPLASTY Left    TOTAL SHOULDER REPLACEMENT Right    TRANSESOPHAGEAL ECHOCARDIOGRAM  08/08/2013   Mild LVH, EF 60-65%. Moderate LA dilation and mild RA dilation. Mild MR with no evidence of stenosis and no evidence of endocarditis. A false chordae is noted.   TRANSTHORACIC ECHOCARDIOGRAM  08/03/2013   Mild-Moderate concentric LVH, EF 60-65%. Normal diastolic function. Mild LA dilation. Mild MR with possible vegetation  - not confirmed on TEE    ULNAR NERVE TRANSPOSITION Right 08/06/2016   Procedure: ULNAR NERVE DECOMPRESSION/TRANSPOSITION;  Surgeon: [name], MD;  Location:  [facility];  Service: Orthopedics;  Laterality: Right;   UPPER GI ENDOSCOPY     VAGINAL HYSTERECTOMY      Social History   Socioeconomic History   Marital status: Married    Spouse name: Not on file   Number of children: Not on file   Years of education: Not on file   Highest education level: Not on file  Occupational History   Not on file  Tobacco Use   Smoking status: Never   Smokeless tobacco: Never  Vaping Use   Vaping Use: Never used  Substance and Sexual Activity   Alcohol use: No    Alcohol/week: 0.0 standard drinks of alcohol   Drug use: Never   Sexual activity: Not Currently  Other Topics Concern   Not on file  Social History Narrative   She is currently married -- for 30+ years. Does not work. Does not smoke or take alcohol. She never smoked. She exercises at least 3 days a week since before her gastric surgery.   Marital status reviewed in history of present illness.   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    Social Determinants of Health   Financial Resource Strain: Low Risk  (05/08/2020)   Overall Financial Resource Strain (CARDIA)    Difficulty of Paying Living Expenses: Not very hard  Food Insecurity: No Food Insecurity (05/08/2020)   Hunger Vital Sign    Worried About Running Out of Food in the Last Year: Never true    Ran Out of Food in the Last Year: Never true  Transportation Needs: Unmet Transportation Needs (05/07/2020)   PRAPARE - Hydrologist (Medical): Yes    Lack of Transportation (Non-Medical): Yes  Physical Activity: Not on file  Stress: Not on file  Social Connections: Not on file  Intimate Partner Violence: Not on file    Family History  Problem Relation Age of Onset   Skin cancer Father    Diabetes Father    Hypertension Father    Peripheral vascular disease Father  Cancer Father    Cerebral aneurysm Father    Alcohol abuse Father    Varicose  Veins Mother    Kidney disease Mother    Arthritis Mother    Mental illness Sister    Cancer Maternal Aunt        breast   Breast cancer Maternal Aunt    Arthritis Maternal Grandmother    Hypertension Maternal Grandmother    Diabetes Maternal Grandmother    Arthritis Maternal Grandfather    Heart disease Maternal Grandfather    Hypertension Maternal Grandfather    Arthritis Paternal Grandmother    Hypertension Paternal Grandmother    Diabetes Paternal Grandmother    Arthritis Paternal Grandfather    Hypertension Paternal Grandfather    Bladder Cancer Neg Hx    Kidney cancer Neg Hx      Current Outpatient Medications:    acetaminophen (TYLENOL) 325 MG tablet, Take 2 tablets (650 mg total) by mouth every 6 (six) hours as needed for mild pain or moderate pain (or Fever >/= 101)., Disp: , Rfl:    ALPRAZolam (XANAX) 0.25 MG tablet, Take 1 tablet (0.25 mg total) by mouth 2 (two) times daily as needed for anxiety. See SIG change was taking 1/2 of 0.5 tablet bid prn changed to 0.25 bid prn. D/c 0.5 xanax, Disp: 60 tablet, Rfl: 2   buPROPion (WELLBUTRIN XL) 150 MG 24 hr tablet, Take 1 tablet (150 mg total) by mouth daily., Disp: 90 tablet, Rfl: 3   calcium-vitamin D (OSCAL WITH D) 500-200 MG-UNIT tablet, Take 1 tablet by mouth 2 (two) times daily with a meal., Disp: 60 tablet, Rfl: 0   cholecalciferol (VITAMIN D) 25 MCG tablet, Take 1 tablet (1,000 Units total) by mouth 2 (two) times daily with a meal., Disp: 30 tablet, Rfl: 2   clobetasol cream (TEMOVATE) 0.27 %, Apply 1 application topically 2 (two) times daily as needed (skin irritation). , Disp: , Rfl:    diclofenac Sodium (VOLTAREN) 1 % GEL, Apply 2 g topically 3 (three) times daily., Disp: 100 g, Rfl: 2   DULoxetine (CYMBALTA) 60 MG capsule, Take 1 capsule (60 mg total) by mouth daily., Disp: 90 capsule, Rfl: 3   ELIQUIS 5 MG TABS tablet, TAKE 1 TABLET BY MOUTH TWICE DAILY, Disp: 180 tablet, Rfl: 1   ergocalciferol (VITAMIN D2) 1.25 MG  (50000 UT) capsule, Take 50,000 Units by mouth once a week., Disp: , Rfl:    estradiol (ESTRACE) 0.1 MG/GM vaginal cream, Place 1 Applicatorful vaginally every Monday, Wednesday, and Friday. Apply 0.'5mg'$  (pea-sized amount)  just inside the vaginal introitus with a finger-tip on Monday, Wednesday and Friday nights,, Disp: 42.5 g, Rfl: 11   Fluticasone-Umeclidin-Vilant (TRELEGY ELLIPTA) 200-62.5-25 MCG/ACT AEPB, Inhale 1 puff into the lungs daily., Disp: 60 each, Rfl: 0   folic acid (FOLVITE) 1 MG tablet, Take 1 tablet (1 mg total) by mouth daily., Disp: 90 tablet, Rfl: 3   furosemide (LASIX) 20 MG tablet, Take 1 tablet (20 mg total) by mouth as directed. Take 1 tablet daily. May take additional 1 tablet If weight gain of greater than 2 pounds in 24 hours or 5 pounds in one week., Disp: 90 tablet, Rfl: 3   GEMTESA 75 MG TABS, TAKE 1 TABLET BY MOUTH DAILY, Disp: 30 tablet, Rfl: 11   HYDROcodone-acetaminophen (NORCO/VICODIN) 5-325 MG tablet, Take 1 tablet by mouth 2 (two) times daily as needed for severe pain. Must last 30 days, Disp: 45 tablet, Rfl: 0   HYDROcodone-acetaminophen (NORCO/VICODIN) 5-325  MG tablet, Take 1 tablet by mouth 2 (two) times daily as needed for severe pain. Must last 30 days, Disp: 45 tablet, Rfl: 0   [START ON 01/04/2022] HYDROcodone-acetaminophen (NORCO/VICODIN) 5-325 MG tablet, Take 1 tablet by mouth 2 (two) times daily as needed for severe pain. Must last 30 days, Disp: 45 tablet, Rfl: 0   levocetirizine (XYZAL) 5 MG tablet, TAKE ONE TABLET BY MOUTH AT BEDTIME AS NEEDED FOR ALLERGIES, Disp: 90 tablet, Rfl: 3   levothyroxine (SYNTHROID) 137 MCG tablet, Take 1 tablet (137 mcg total) by mouth daily before breakfast. 30 minutes do not mix with vitamins or acid reflux medications D/c 150 mcg, Disp: 90 tablet, Rfl: 3   losartan (COZAAR) 50 MG tablet, Take 1 tablet (50 mg total) by mouth daily., Disp: 90 tablet, Rfl: 3   metoprolol succinate (TOPROL-XL) 25 MG 24 hr tablet, Take 0.5  tablets (12.5 mg total) by mouth daily., Disp: 45 tablet, Rfl: 3   montelukast (SINGULAIR) 10 MG tablet, Take 1 tablet (10 mg total) by mouth at bedtime., Disp: 90 tablet, Rfl: 3   Multiple Vitamin (MULTIVITAMIN WITH MINERALS) TABS tablet, Take 1 tablet by mouth daily., Disp: , Rfl:    nystatin cream (MYCOSTATIN), Apply 1 application. topically 2 (two) times daily. Angles of mouth or skin folds prn, Disp: 30 g, Rfl: 11   ondansetron (ZOFRAN) 4 MG tablet, Take 1 tablet (4 mg total) by mouth every 8 (eight) hours as needed for nausea or vomiting., Disp: 90 tablet, Rfl: 1   pantoprazole (PROTONIX) 40 MG tablet, Take 1 tablet (40 mg total) by mouth daily. 30 min before food, Disp: 90 tablet, Rfl: 3   pregabalin (LYRICA) 50 MG capsule, TAKE 1 CAPSULE BY MOUTH 3 TIMES DAILY, Disp: 90 capsule, Rfl: 2   rosuvastatin (CRESTOR) 20 MG tablet, TAKE 1 TABLET BY  MOUTH ONCE DAILY, Disp: 30 tablet, Rfl: 0   Semaglutide, 1 MG/DOSE, 4 MG/3ML SOPN, Inject 1 mg into the skin once a week. Start this month 3 after 0.25 weekly x 5monthand 0.5 weekly x 1 month, Disp: 3 mL, Rfl: 0   Semaglutide, 2 MG/DOSE, (OZEMPIC, 2 MG/DOSE,) 8 MG/3ML SOPN, Inject 2 mg into the skin once a week. Start this weekly after week 4. Start after 0.25 weekly x 1 month, then 0.5 weekly x 1 month, then 1 mg weekly x 1 month, Disp: 3 mL, Rfl: 5   Semaglutide,0.25 or 0.'5MG'$ /DOS, 2 MG/3ML SOPN, Inject 0.25 mg into the skin once a week. X 1 month then increase to 0.5 weekly x 1 month then increase to 1 mg weekly x 1 month then increase to 2 mg weekly x 1 month if tolerating. D/c metformin and Januvia 50, Disp: 3 mL, Rfl: 1   sucralfate (CARAFATE) 1 GM/10ML suspension, Take 1 g by mouth 3 (three) times daily. , Disp: , Rfl:    albuterol (PROVENTIL) (2.5 MG/3ML) 0.083% nebulizer solution, Take 3 mLs (2.5 mg total) by nebulization every 4 (four) hours as needed for wheezing or shortness of breath., Disp: 75 mL, Rfl: 12   colchicine 0.6 MG tablet, Take 1  tablet (0.6 mg total) by mouth as needed. May repeat 1 pill in 1 hr. No more than 3 total pills 1.8 mg in 24 hours as needed gout flare (Patient not taking: Reported on 12/23/2021), Disp: 30 tablet, Rfl: 2   Misc. Devices (TUB TRANSFER BOARD) MISC, 1 Device by Does not apply route as needed., Disp: 1 each, Rfl: 0  mupirocin ointment (BACTROBAN) 2 %, APPLY TOPICALLY TWICE DAILY TO RIGHT LOWER LEG AS DIRECTED., Disp: 22 g, Rfl: 0   Powders (GOLD BOND NO MESS BODY POWDER) AERP, Apply 1 application. topically daily as needed. Gold bond talc free powder, Disp: 200 g, Rfl: 11   thiamine 100 MG tablet, Take 0.5 tablets (50 mg total) by mouth 2 (two) times daily with a meal. (Patient not taking: Reported on 12/23/2021), Disp: 60 tablet, Rfl: 2  Physical exam:  Vitals:   12/23/21 1154  BP: 127/65  Pulse: 64  Resp: 18  Temp: 98.8 F (37.1 C)  SpO2: 95%  Weight: 264 lb 3.2 oz (119.8 kg)   Physical Exam Constitutional:      General: She is not in acute distress. Cardiovascular:     Rate and Rhythm: Normal rate and regular rhythm.     Heart sounds: Normal heart sounds.  Pulmonary:     Effort: Pulmonary effort is normal.     Breath sounds: Normal breath sounds.  Abdominal:     General: Bowel sounds are normal.     Palpations: Abdomen is soft.  Skin:    General: Skin is warm and dry.  Neurological:     Mental Status: She is alert and oriented to person, place, and time.         Latest Ref Rng & Units 11/27/2021    2:12 PM  CMP  Glucose 70 - 99 mg/dL 151   BUN 6 - 23 mg/dL 27   Creatinine 0.40 - 1.20 mg/dL 1.30   Sodium 135 - 145 mEq/L 139   Potassium 3.5 - 5.1 mEq/L 5.0   Chloride 96 - 112 mEq/L 101   CO2 19 - 32 mEq/L 25   Calcium 8.4 - 10.5 mg/dL 9.5   Total Protein 6.0 - 8.3 g/dL 6.9   Total Bilirubin 0.2 - 1.2 mg/dL 0.5   Alkaline Phos 39 - 117 U/L 80   AST 0 - 37 U/L 16   ALT 0 - 35 U/L 15       Latest Ref Rng & Units 12/23/2021   11:24 AM  CBC  WBC 4.0 - 10.5 K/uL 6.3    Hemoglobin 12.0 - 15.0 g/dL 12.2   Hematocrit 36.0 - 46.0 % 37.7   Platelets 150 - 400 K/uL 186      Assessment and plan- Patient is a 71 y.o. adult here for routine follow-up of iron deficiency anemia  Patient is not currently anemic and her hemoglobin is normal at 12.2.  Ferritin levels are 38 which is normal with a TIBC that is normal as well.  I am holding off on giving her any IV iron at this time.  I will repeat CBC ferritin and iron studies in 3 in 6 months and see her back in 6 months   Visit Diagnosis 1. Iron deficiency anemia, unspecified iron deficiency anemia type      Dr. Randa Evens, MD, MPH [facility] [phone] 12/23/2021 3:08 PM

## 2021-12-30 ENCOUNTER — Ambulatory Visit: Payer: Medicare HMO | Admitting: Nurse Practitioner

## 2022-01-05 ENCOUNTER — Other Ambulatory Visit: Payer: Self-pay | Admitting: Cardiovascular Disease

## 2022-01-05 DIAGNOSIS — Z8673 Personal history of transient ischemic attack (TIA), and cerebral infarction without residual deficits: Secondary | ICD-10-CM

## 2022-01-07 NOTE — Telephone Encounter (Signed)
[facility] checking on status, fax sent

## 2022-01-07 NOTE — Telephone Encounter (Signed)
   Patient Name: [name]  DOB: [birth year] MRN: [id number]  Primary Cardiologist: Kathlyn Sacramento, MD  Chart reviewed as part of pre-operative protocol coverage.  Patient was previously cleared for procedure.  I will route prior recommendations to the requesting party via Epic fax function and remove from pre-op pool.  Please call with questions.  Lenna Sciara, NP 01/07/2022, 3:58 PM

## 2022-01-12 ENCOUNTER — Encounter: Payer: Self-pay | Admitting: *Deleted

## 2022-01-13 ENCOUNTER — Encounter: Payer: Self-pay | Admitting: Obstetrics and Gynecology

## 2022-01-13 ENCOUNTER — Ambulatory Visit (INDEPENDENT_AMBULATORY_CARE_PROVIDER_SITE_OTHER): Payer: Medicare HMO | Admitting: Obstetrics and Gynecology

## 2022-01-13 VITALS — BP 153/77 | HR 97

## 2022-01-13 DIAGNOSIS — G4730 Sleep apnea, unspecified: Secondary | ICD-10-CM | POA: Diagnosis not present

## 2022-01-13 DIAGNOSIS — I480 Paroxysmal atrial fibrillation: Secondary | ICD-10-CM | POA: Diagnosis not present

## 2022-01-13 DIAGNOSIS — Z87442 Personal history of urinary calculi: Secondary | ICD-10-CM | POA: Diagnosis not present

## 2022-01-13 DIAGNOSIS — R82998 Other abnormal findings in urine: Secondary | ICD-10-CM | POA: Diagnosis not present

## 2022-01-13 DIAGNOSIS — I081 Rheumatic disorders of both mitral and tricuspid valves: Secondary | ICD-10-CM | POA: Diagnosis not present

## 2022-01-13 DIAGNOSIS — G4700 Insomnia, unspecified: Secondary | ICD-10-CM | POA: Diagnosis not present

## 2022-01-13 DIAGNOSIS — I2721 Secondary pulmonary arterial hypertension: Secondary | ICD-10-CM | POA: Diagnosis not present

## 2022-01-13 DIAGNOSIS — N390 Urinary tract infection, site not specified: Secondary | ICD-10-CM | POA: Diagnosis not present

## 2022-01-13 DIAGNOSIS — I5032 Chronic diastolic (congestive) heart failure: Secondary | ICD-10-CM | POA: Diagnosis not present

## 2022-01-13 DIAGNOSIS — Z79899 Other long term (current) drug therapy: Secondary | ICD-10-CM | POA: Diagnosis not present

## 2022-01-13 DIAGNOSIS — Z8673 Personal history of transient ischemic attack (TIA), and cerebral infarction without residual deficits: Secondary | ICD-10-CM | POA: Diagnosis not present

## 2022-01-13 DIAGNOSIS — E039 Hypothyroidism, unspecified: Secondary | ICD-10-CM | POA: Diagnosis not present

## 2022-01-13 DIAGNOSIS — D649 Anemia, unspecified: Secondary | ICD-10-CM | POA: Diagnosis not present

## 2022-01-13 DIAGNOSIS — Z791 Long term (current) use of non-steroidal anti-inflammatories (NSAID): Secondary | ICD-10-CM | POA: Diagnosis not present

## 2022-01-13 DIAGNOSIS — D509 Iron deficiency anemia, unspecified: Secondary | ICD-10-CM | POA: Diagnosis not present

## 2022-01-13 DIAGNOSIS — Z7901 Long term (current) use of anticoagulants: Secondary | ICD-10-CM | POA: Diagnosis not present

## 2022-01-13 DIAGNOSIS — I130 Hypertensive heart and chronic kidney disease with heart failure and stage 1 through stage 4 chronic kidney disease, or unspecified chronic kidney disease: Secondary | ICD-10-CM | POA: Diagnosis not present

## 2022-01-13 DIAGNOSIS — E785 Hyperlipidemia, unspecified: Secondary | ICD-10-CM | POA: Diagnosis not present

## 2022-01-13 DIAGNOSIS — J45909 Unspecified asthma, uncomplicated: Secondary | ICD-10-CM | POA: Diagnosis not present

## 2022-01-13 DIAGNOSIS — K219 Gastro-esophageal reflux disease without esophagitis: Secondary | ICD-10-CM | POA: Diagnosis not present

## 2022-01-13 DIAGNOSIS — E1151 Type 2 diabetes mellitus with diabetic peripheral angiopathy without gangrene: Secondary | ICD-10-CM | POA: Diagnosis not present

## 2022-01-13 DIAGNOSIS — E119 Type 2 diabetes mellitus without complications: Secondary | ICD-10-CM | POA: Diagnosis not present

## 2022-01-13 DIAGNOSIS — Z86718 Personal history of other venous thrombosis and embolism: Secondary | ICD-10-CM | POA: Diagnosis not present

## 2022-01-13 DIAGNOSIS — E1122 Type 2 diabetes mellitus with diabetic chronic kidney disease: Secondary | ICD-10-CM | POA: Diagnosis not present

## 2022-01-13 MED ORDER — SULFAMETHOXAZOLE-TRIMETHOPRIM 800-160 MG PO TABS: 1.0000 | ORAL_TABLET | Freq: Two times a day (BID) | ORAL | 0 refills | Status: AC

## 2022-01-13 MED ORDER — CEPHALEXIN 250 MG PO CAPS: 250.0000 mg | ORAL_CAPSULE | Freq: Every day | ORAL | 5 refills | Status: DC

## 2022-01-13 NOTE — Progress Notes (Signed)
[facility]   Subjective:     Chief Complaint:  Chief Complaint  Patient presents with   Follow-up    BAHJA BENCE is a 71 y.o. adult here for a 4 month f/u.   History of Present Illness: AMERICUS SCHEURICH is a 71 y.o. female with stress incontinence and OAB   [name] has been working very well for her. Has rare leakage with cough/ sneeze and did not feel that she needed physical therapy. Has been working on kegel exercises on her own.   Using vaginal estrogen cream but it causes burning. Having burning with urination that started a few days ago- feels like she has a UTI. She has been taking some leftover macrobid for a few days.   Last UTI was 11/13/21- klebsiella pneumoniae  Past Medical History: Patient  has a past medical history of Abnormal antibody titer, Anxiety and depression, Arthritis, Asthma, Atypical chest pain, Carotid arterial disease ([place]), Chronic heart failure with preserved ejection fraction (HFpEF) ([place]), CKD (chronic kidney disease), stage III ([place]), Cystocele, Depression, Diabetes ([place]), DVT (deep venous thrombosis) ([place]), Dyspnea, Edema, GERD (gastroesophageal reflux disease), Heart murmur, History of IBS, History of kidney stones, History of methicillin resistant staphylococcus aureus (MRSA) (2008), History of multiple strokes, Hyperlipidemia, Hypertension, Hypothyroidism, Insomnia, Kidney stone, Mitral regurgitation, Neuropathy involving both lower extremities, Non-healing ulcer of left foot ([place]), Obesity, Class II, BMI 35-39.9, with comorbidity, PAF (paroxysmal atrial fibrillation) ([place]), PAH (pulmonary artery hypertension) ([place]), Peripheral vascular disease ([place]), Sleep apnea, Stroke ([place]), Tricuspid regurgitation, Urinary incontinence, and Venous stasis dermatitis of both lower extremities.   Past Surgical History: She  has a past surgical history that includes Ankle surgery (Right); Vaginal hysterectomy; Sleeve Gastroplasty; Lithotripsy; Kidney  surgery (Right); transthoracic echocardiogram (08/03/2013); transesophageal echocardiogram (08/08/2013); Tonsillectomy; Total knee arthroplasty (Left); Hiatal hernia repair; Cholecystectomy; I & D extremity (Left, 06/27/2015); Application if wound vac (Left, 06/27/2015); removal of left hematoma (Left); NM GATED MYOVIEW [facility] HX) (February 2017); Reverse shoulder arthroplasty (Right, 10/08/2015); Hernia repair; Ulnar nerve transposition (Right, 08/06/2016); arm surgery; Irrigation and debridement abscess (Left, 09/07/2016); Incision and drainage of wound (Left, 09/25/2016); Application if wound vac (Left, 09/25/2016); Colonoscopy; Upper gi endoscopy; Esophagogastroduodenoscopy (egd) with propofol (N/A, 01/11/2017); Colonoscopy with propofol (N/A, 01/11/2017); Colonoscopy with propofol (N/A, 02/22/2017); OTHER SURGICAL HISTORY; Total shoulder replacement (Right); Joint replacement (2013); Colonoscopy with propofol (N/A, 05/31/2017); Lower Extremity Angiography (Left, 04/04/2018); Esophagogastroduodenoscopy (egd) with propofol (N/A, 10/25/2018); Incision and drainage of wound (Left, 11/09/2018); and Esophagogastroduodenoscopy (egd) with propofol (N/A, 11/29/2019).   Medications: She has a current medication list which includes the following prescription(s): acetaminophen, albuterol, alprazolam, bupropion, calcium-vitamin d, vitamin d3, clobetasol cream, colchicine, diclofenac sodium, duloxetine, eliquis, ergocalciferol, estradiol, trelegy ellipta, folic acid, gemtesa, hydrocodone-acetaminophen, levocetirizine, levothyroxine, losartan, metoprolol succinate, tub transfer board, montelukast, multivitamin with minerals, mupirocin ointment, nystatin cream, ondansetron, pantoprazole, gold bond no mess body powder, pregabalin, rosuvastatin, semaglutide (1 mg/dose), ozempic (2 mg/dose), semaglutide(0.25 or 0.'5mg'$ /dos), sucralfate, thiamine, and furosemide.   Allergies: Patient is allergic to morphine and related, penicillins, ambien  [zolpidem], aspirin, and oxytetracycline.   Social History: Patient  reports that she has never smoked. She has never used smokeless tobacco. She reports that she does not drink alcohol and does not use drugs.      Objective:    Physical Exam: BP (!) 153/77   Pulse 97  Gen: No apparent distress, A&O x 3.    POC urine: small leukocytes, negative nitrites Assessment/Plan:    Assessment: Ms. Garbett is a 71 y.o.  with stress incontinence and OAB, now with recurrent UTI  Plan: - Continue with Gemtesa '75mg'$  daily.  - Estrace 3 times a week. - Small leukocytes today in urine and has symptoms so will send for culture and treat with bactrim DS BID x3 days. Prior cultures have been resistant to macrobid so advised her to stop this. - Since she has had several UTIs in the last year, discussed starting prophylaxis. Ordered keflex '250mg'$  oral daily x 6 months to start when she has completed the bactrim.   Return 6 months  Jaquita Folds, MD

## 2022-01-13 NOTE — Patient Instructions (Signed)
Take bactrim twice a day for 3 days. Then start keflex once a day for 6 months.

## 2022-01-14 ENCOUNTER — Telehealth: Payer: Self-pay

## 2022-01-14 NOTE — Telephone Encounter (Signed)
I transferred call to Access Nurse. 

## 2022-01-14 NOTE — Telephone Encounter (Signed)
Patient states her blood sugar is running 160-177 first thing in the morning (fasting).  Patient states Dr. Olivia Mackie [name] put her on Semaglutide, 2 MG/DOSE, (OZEMPIC, 2 MG/DOSE,) 8 MG/3ML SOPN because her A1c was the highest it's been in years.    Patient states now, she has places on her right leg that are open, weeping and painful.  Patient states she called Wound Care and she can't get in until mid-November.  Patient states it started with a blister on her leg about six weeks ago and it has progressed since then.

## 2022-01-15 LAB — URINE CULTURE: Culture: >=100000 — AB

## 2022-01-15 NOTE — Telephone Encounter (Signed)
Please call to check if patient followed up with UC

## 2022-01-15 NOTE — Telephone Encounter (Signed)
Pt transferred to access nurse due to pt c/o her blood glucose is 160-177 as first thing in the morning. Had put her on Ozempic 5 mg dose. Now has open, weeping sores on R leg. Started as a blister and has progressed. She has diabetes x 40 years. Right leg sore is larger than 2 fingers that is weeping and has blister present. She also has UTI. Prescribed Bactrum. 4 UTIs over 6 months. Taking maintenance dose of Cephalaxin. Pt was advised to go to UC for further eval.

## 2022-01-15 NOTE — Telephone Encounter (Signed)
Called pt to f/u to see if she was able to be evaluated at UC due to her ongoing sx's. Unable to reach nor lvm, due to phone just ringing. See note.

## 2022-01-19 ENCOUNTER — Encounter (INDEPENDENT_AMBULATORY_CARE_PROVIDER_SITE_OTHER): Payer: Self-pay

## 2022-01-20 ENCOUNTER — Telehealth: Payer: Self-pay | Admitting: Family Medicine

## 2022-01-20 NOTE — Telephone Encounter (Signed)
Dr. Fletcher Anon, please see request from GI asking for a longer hold of Eliquis and response from our pharmacy team. Reply can be sent to the preop pool.

## 2022-01-20 NOTE — Telephone Encounter (Signed)
Hold Eliquis 2 days before but the patient has to understand that there is increased risk of stroke with this.

## 2022-01-20 NOTE — Telephone Encounter (Signed)
[facility] is requesting call back today to discuss if patient can hold eliquis more than 2 days. She states that they were cleared to hold for 1 day, but need pt needs this held for longer and need this cleared by cardiologist as well. Requesting call back for update on fax sent and to see if they can get this answered today so they know if they should change surgery day.

## 2022-01-20 NOTE — Patient Instructions (Signed)
____________________________________________________________________________________________  Patient Information update  To: All of our patients.  Re: Name change.  It has been made official that our current name, "[facility]"   will soon be changed to "[facility]".   The purpose of this change is to eliminate any confusion created by the concept of our practice being a "Medication Management Pain Clinic". In the past this has led to the misconception that we treat pain primarily by the use of prescription medications.  Nothing can be farther from the truth.   Understanding PAIN MANAGEMENT: To further understand what our practice does, you first have to understand that "Pain Management" is a subspecialty that requires additional training once a physician has completed their specialty training, which can be in either Anesthesia, Neurology, Psychiatry, or Physical Medicine and Rehabilitation (PMR). Each one of these contributes to the final approach taken by each physician to the management of their patient's pain. To be a "Pain Management Specialist" you must have first completed one of the specialty trainings below.  Anesthesiologists - trained in clinical pharmacology and interventional techniques such as nerve blockade and regional as well as central neuroanatomy. They are trained to block pain before, during, and after surgical interventions.  Neurologists - trained in the diagnosis and pharmacological treatment of complex neurological conditions, such as Multiple Sclerosis, Parkinson's, spinal cord injuries, and other systemic conditions that may be associated with symptoms that may include but are not limited to pain. They tend to rely primarily on the treatment of chronic pain using prescription medications.  Psychiatrist - trained in conditions affecting the psychosocial wellbeing  of patients including but not limited to depression, anxiety, schizophrenia, personality disorders, addiction, and other substance use disorders that may be associated with chronic pain. They tend to rely primarily on the treatment of chronic pain using prescription medications.   Physical Medicine and Rehabilitation (PMR) physicians, also known as physiatrists - trained to treat a wide variety of medical conditions affecting the brain, spinal cord, nerves, bones, joints, ligaments, muscles, and tendons. Their training is primarily aimed at treating patients that have suffered injuries that have caused severe physical impairment. Their training is primarily aimed at the physical therapy and rehabilitation of those patients. They may also work alongside orthopedic surgeons or neurosurgeons using their expertise in assisting surgical patients to recover after their surgeries.  INTERVENTIONAL PAIN MANAGEMENT is sub-subspecialty of Pain Management.  Our physicians are Board-certified in Anesthesia, Pain Management, and Interventional Pain Management.  This meaning that not only have they been trained and Board-certified in their specialty of Anesthesia, and subspecialty of Pain Management, but they have also received further training in the sub-subspecialty of Interventional Pain Management, in order to become Board-certified as INTERVENTIONAL PAIN MANAGEMENT SPECIALIST.    Mission: Our goal is to use our skills in  INTERVENTIONAL PAIN MANAGEMENT as alternatives to the chronic use of prescription opioid medications for the treatment of pain. To make this more clear, we have changed our name to reflect what we do and offer. We will continue to offer medication management assessment and recommendations, but we will not be taking over any patient's medication management.  ____________________________________________________________________________________________   _______________________________________________________________________  Medication Rules  Purpose: To inform patients, and their family members, of our medication rules and regulations.  Applies to: All patients receiving prescriptions from our practice (written or electronic).  Pharmacy of record: This is the pharmacy where your electronic prescriptions will be sent.   Make sure we have the correct one.  Electronic prescriptions: In compliance with the [place] Strengthen Opioid Misuse Prevention (STOP) Act of 2017 (Session Law 2017-74/H243), effective March 23, 2018, all controlled substances must be electronically prescribed. Written prescriptions, faxing, or calling prescriptions to a pharmacy will no longer be done.  Prescription refills: These will be provided only during in-person appointments. No medications will be renewed without a "face-to-face" evaluation with your provider. Applies to all prescriptions.  NOTE: The following applies primarily to controlled substances (Opioid* Pain Medications).   Type of encounter (visit): For patients receiving controlled substances, face-to-face visits are required. (Not an option and not up to the patient.)  Patient's responsibilities: Pain Pills: Bring all pain pills to every appointment (except for procedure appointments). Pill Bottles: Bring pills in original pharmacy bottle. Bring bottle, even if empty. Always bring the bottle of the most recent fill.  Medication refills: You are responsible for knowing and keeping track of what medications you are taking and when is it that you will need a refill. The day before your appointment: write a list of all prescriptions that need to be refilled. The day of the appointment: give the list to the admitting nurse. Prescriptions will be written only during appointments. No prescriptions will be written on procedure days. If you forget a medication: it will not be "Called in", "Faxed", or  "electronically sent". You will need to get another appointment to get these prescribed. No early refills. Do not call asking to have your prescription filled early. Partial  or short prescriptions: Occasionally your pharmacy may not have enough pills to fill your prescription.  NEVER ACCEPT a partial fill or a prescription that is short of the total amount of pills that you were prescribed.  With controlled substances the law allows 72 hours for the pharmacy to complete the prescription.  If the prescription is not completed within 72 hours, the pharmacist will require a new prescription to be written. This means that you will be short on your medicine and we WILL NOT send another prescription to complete your original prescription.  Instead, request the pharmacy to send a carrier to a nearby branch to get enough medication to provide you with your full prescription. Prescription Accuracy: You are responsible for carefully inspecting your prescriptions before leaving our office. Have the discharge nurse carefully go over each prescription with you, before taking them home. Make sure that your name is accurately spelled, that your address is correct. Check the name and dose of your medication to make sure it is accurate. Check the number of pills, and the written instructions to make sure they are clear and accurate. Make sure that you are given enough medication to last until your next medication refill appointment. Taking Medication: Take medication as prescribed. When it comes to controlled substances, taking less pills or less frequently than prescribed is permitted and encouraged. Never take more pills than instructed. Never take the medication more frequently than prescribed.  Inform other Doctors: Always inform, all of your healthcare providers, of all the medications you take. Pain Medication from other Providers: You are not allowed to accept any additional pain medication from any other Doctor or  Healthcare provider. There are two exceptions to this rule. (see below) In the event that you require additional pain medication, you are responsible for notifying us, as stated below. Cough Medicine: Often these contain an opioid, such as codeine or hydrocodone. Never accept or take cough medicine containing these opioids if   you are already taking an opioid* medication. The combination may cause respiratory failure and death. Medication Agreement: You are responsible for carefully reading and following our Medication Agreement. This must be signed before receiving any prescriptions from our practice. Safely store a copy of your signed Agreement. Violations to the Agreement will result in no further prescriptions. (Additional copies of our Medication Agreement are available upon request.) Laws, Rules, & Regulations: All patients are expected to follow all Federal and State Laws, Statutes, Rules, & Regulations. Ignorance of the Laws does not constitute a valid excuse.  Illegal drugs and Controlled Substances: The use of illegal substances (including, but not limited to marijuana and its derivatives) and/or the illegal use of any controlled substances is strictly prohibited. Violation of this rule may result in the immediate and permanent discontinuation of any and all prescriptions being written by our practice. The use of any illegal substances is prohibited. Adopted CDC guidelines & recommendations: Target dosing levels will be at or below 60 MME/day. Use of benzodiazepines** is not recommended.  Exceptions: There are only two exceptions to the rule of not receiving pain medications from other Healthcare Providers. Exception #1 (Emergencies): In the event of an emergency (i.e.: accident requiring emergency care), you are allowed to receive additional pain medication. However, you are responsible for: As soon as you are able, call our office [phone], at any time of the day or night, and leave a  message stating your name, the date and nature of the emergency, and the name and dose of the medication prescribed. In the event that your call is answered by a member of our staff, make sure to document and save the date, time, and the name of the person that took your information.  Exception #2 (Planned Surgery): In the event that you are scheduled by another doctor or dentist to have any type of surgery or procedure, you are allowed (for a period no longer than 30 days), to receive additional pain medication, for the acute post-op pain. However, in this case, you are responsible for picking up a copy of our "Post-op Pain Management for Surgeons" handout, and giving it to your surgeon or dentist. This document is available at our office, and does not require an appointment to obtain it. Simply go to our office during business hours (Monday-Thursday from 8:00 AM to 4:00 PM) (Friday 8:00 AM to 12:00 Noon) or if you have a scheduled appointment with us, prior to your surgery, and ask for it by name. In addition, you are responsible for: calling our office [phone], at any time of the day or night, and leaving a message stating your name, name of your surgeon, type of surgery, and date of procedure or surgery. Failure to comply with your responsibilities may result in termination of therapy involving the controlled substances. Medication Agreement Violation. Following the above rules, including your responsibilities will help you in avoiding a Medication Agreement Violation ("Breaking your Pain Medication Contract").  Consequences:  Not following the above rules may result in permanent discontinuation of medication prescription therapy.  *Opioid medications include: morphine, codeine, oxycodone, oxymorphone, hydrocodone, hydromorphone, meperidine, tramadol, tapentadol, buprenorphine, fentanyl, methadone. **Benzodiazepine medications include: diazepam (Valium), alprazolam (Xanax), clonazepam (Klonopine),  lorazepam (Ativan), clorazepate (Tranxene), chlordiazepoxide (Librium), estazolam (Prosom), oxazepam (Serax), temazepam (Restoril), triazolam (Halcion) (Last updated: 01/13/2022) ______________________________________________________________________   ______________________________________________________________________  Medication Recommendations and Reminders  Applies to: All patients receiving prescriptions (written and/or electronic).  Medication Rules & Regulations: You are responsible for reading, knowing, and   following our "Medication Rules" document. These exist for your safety and that of others. They are not flexible and neither are we. Dismissing or ignoring them is an act of "non-compliance" that may result in complete and irreversible termination of such medication therapy. For safety reasons, "non-compliance" will not be tolerated. As with the [place] fundamental legal principle of "ignorance of the law is no defense", we will accept no excuses for not having read and knowing the content of documents provided to you by our practice.  Pharmacy of record:  Definition: This is the pharmacy where your electronic prescriptions will be sent.  We do not endorse any particular pharmacy. It is up to you and your insurance to decide what pharmacy to use.  We do not restrict you in your choice of pharmacy. However, once we write for your prescriptions, we will NOT be re-sending more prescriptions to fix restricted supply problems created by your pharmacy, or your insurance.  The pharmacy listed in the electronic medical record should be the one where you want electronic prescriptions to be sent. If you choose to change pharmacy, simply notify our nursing staff. Changes will be made only during your regular appointments and not over the phone.  Recommendations: Keep all of your pain medications in a safe place, under lock and key, even if you live alone. We will NOT replace lost, stolen, or  damaged medication. We do not accept "Police Reports" as proof of medications having been stolen. After you fill your prescription, take 1 week's worth of pills and put them away in a safe place. You should keep a separate, properly labeled bottle for this purpose. The remainder should be kept in the original bottle. Use this as your primary supply, until it runs out. Once it's gone, then you know that you have 1 week's worth of medicine, and it is time to come in for a prescription refill. If you do this correctly, it is unlikely that you will ever run out of medicine. To make sure that the above recommendation works, it is very important that you make sure your medication refill appointments are scheduled at least 1 week before you run out of medicine. To do this in an effective manner, make sure that you do not leave the office without scheduling your next medication management appointment. Always ask the nursing staff to show you in your prescription , when your medication will be running out. Then arrange for the receptionist to get you a return appointment, at least 7 days before you run out of medicine. Do not wait until you have 1 or 2 pills left, to come in. This is very poor planning and does not take into consideration that we may need to cancel appointments due to bad weather, sickness, or emergencies affecting our staff. DO NOT ACCEPT A "Partial Fill": If for any reason your pharmacy does not have enough pills/tablets to completely fill or refill your prescription, do not allow for a "partial fill". The law allows the pharmacy to complete that prescription within 72 hours, without requiring a new prescription. If they do not fill the rest of your prescription within those 72 hours, you will need a separate prescription to fill the remaining amount, which we will NOT provide. If the reason for the partial fill is your insurance, you will need to talk to the pharmacist about payment alternatives for  the remaining tablets, but again, DO NOT ACCEPT A PARTIAL FILL, unless you can trust your pharmacist to obtain   the remainder of the pills within 72 hours.  Prescription refills and/or changes in medication(s):  Prescription refills, and/or changes in dose or medication, will be conducted only during scheduled medication management appointments. (Applies to both, written and electronic prescriptions.) No refills on procedure days. No medication will be changed or started on procedure days. No changes, adjustments, and/or refills will be conducted on a procedure day. Doing so will interfere with the diagnostic portion of the procedure. No phone refills. No medications will be "called into the pharmacy". No Fax refills. No weekend refills. No Holliday refills. No after hours refills.  Remember:  Business hours are:  Monday to Thursday 8:00 AM to 4:00 PM Provider's Schedule: Duwayne Matters, MD - Appointments are:  Medication management: Monday and Wednesday 8:00 AM to 4:00 PM Procedure day: Tuesday and Thursday 7:30 AM to 4:00 PM Bilal Lateef, MD - Appointments are:  Medication management: Tuesday and Thursday 8:00 AM to 4:00 PM Procedure day: Monday and Wednesday 7:30 AM to 4:00 PM (Last update: 01/13/2022) ______________________________________________________________________  ____________________________________________________________________________________________  Pharmacy Shortages of Pain Medication   Introduction Shockingly as it may seem, .  "No U.S[place] Supreme Court decision has ever interpreted the Constitution as guaranteeing a right to health care for all Americans." - https://www.healthequityandpolicylab.com/elusive-right-to-health-care-under-us-law  "With respect to human rights, the [place] has no formally codified right to health, nor does it participate in a human rights treaty that specifies a right to health." - Scott J. Schweikart, JD, MBE  Situation By  now, most of our patients have had the experience of being told by their pharmacist that they do not have enough medication to cover their prescription. If you have not had this experience, just know that you soon will.  Problem There appears to be a shortage of these medications, either at the national level or locally. This is happening with all pharmacies. When there is not enough medication, patients are offered a partial fill and they are told that they will try to get the rest of the medicine for them at a later time. If they do not have enough for even a partial fill, the pharmacists are telling the patients to call us (the prescribing physicians) to request that we send another prescription to another pharmacy to get the medicine.   This reordering of a controlled substance creates documentation problems where additional paperwork needs to be created to explain why two prescriptions for the same period of time and the same medicine are being prescribed to the same patient. It also creates situations where the last appointment note does not accurately reflect when and what prescriptions were given to a patient. This leads to prescribing errors down the line, in subsequent follow-up visits.   [place] Board of Pharmacy (NCBOP) Research revealed that Board of Pharmacy Rule .1806 (21 NCAC 46.1806) authorizes pharmacists to the transfer of prescriptions among pharmacies, and it sets forth procedural and recordkeeping requirements for doing so. However, this requires the pharmacist to complete the previously mentioned procedural paperwork to accomplish the transfer. As it turns out, it is much easier for them to have the prescribing physicians do the work.   Possible solutions 1. You can ask your physician to assist you in weaning yourself off these medications. 2. Ask your pharmacy if the medication is in stock, 3 days prior to your refill. 3. If you need a pharmacy change, let us know at your  medication management visit. Prescriptions that have already been electronically sent to a pharmacy will   not be re-sent to a different pharmacy if your pharmacy of record does not have it in stock. Proper stocking of medication is a pharmacy problem, not a prescriber problem. Work with your pharmacist to solve the problem. 4. Have the [place] State Assembly add a provision to the "STOP ACT" (the law that mandates how controlled substances are prescribed) where there is an exception to the electronic prescribing rule that states that in the event there are shortages of medications the physicians are allowed to use written prescriptions as opposed to electronic ones. This would allow patients to take their prescriptions to a different pharmacy that may have enough medication available to fill the prescription. The problem is that currently there is a law that does not allow for written prescriptions, with the exception of instances where the electronic medical record is down due to technical issues.  5. Have [place] Congress ease the pressure on pharmaceutical companies, allowing them to produce enough quantities of the medication to adequately supply the population. 6. Have pharmacies keep enough stocks of these medications to cover their client base.  7. Have the [place] State Assembly add a provision to the "STOP ACT" where they ease the regulations surrounding the transfer of controlled substances between pharmacies, so as to simplify the transfer of supplies. As an alternative, develop a system to allow patients to obtain the remainder of their prescription at another one of their pharmacies or at an associate pharmacy.   How this shortage will affect you.  Understand that this is a pharmacy supply problem, not a prescriber problem. Work with your pharmacy to solve it. The job of the prescriber is to evaluate and monitor the patient for the appropriate indications and use of these medicines. It is  not the job of the prescriber to supply the medication or to solve problems with that supply. The responsibility and the choice to obtain the medication resides on the patient. By law, supplying the medication is the job of the pharmacy. It is certainly not the job of the prescriber to solve supply problems.   Due to the above problems we are no longer taking patients to write for their pain medication. Future discussions with your physician may include potentially weaning medications or transitioning to alternatives.  We will be focusing primarily on interventional based pain management. We will continue to evaluate for appropriate indications and we may provide recommendations regarding medication, dose, and schedule, as well as monitoring recommendations, however, we will not be taking over the actual prescribing of these substances. On those patients where we are treating their chronic pain with interventional therapies, exceptions will be considered on a case by case basis. At this time, we will try to continue providing this supplemental service to those patients we have been managing in the past. However, as of August 1st, 2023, we no longer will be sending additional prescriptions to other pharmacies for the purpose of solving their supply problems. Once we send a prescription to a pharmacy, we will not be resending it again to another pharmacy to cover for their shortages.   What to do. Write as many letters as you can. Recruit the help of family members in writing these letters. Below are some of the places where you can write to make your voice heard. Let them know what the problem is and push them to look for solutions.   Search internet for: "[place] find your legislators" https://www.ncleg.gov/findyourlegislators  Search internet for: "[place] insurance commissioner   complaints" https://www.ncdoi.gov/contactscomplaints/assistance-or-file-complaint  Search internet for: "[place] Board of Pharmacy complaints" http://www.ncbop.org/contact.htm  Search internet for: "[facility] pharmacy complaints" Email [facility] Customer Relations https://www.[facility].com/help/email-customer-relations.jsp?callType=store  Search internet for: "[facility] pharmacy customer service complaints" https://www.[facility].com/topic/marketing/contactus/contactus_customerservice.jsp  ____________________________________________________________________________________________  ____________________________________________________________________________________________  Drug Holidays (Slow)  What is a "Drug Holiday"? Drug Holiday: is the name given to the period of time during which a patient stops taking a medication(s) for the purpose of eliminating tolerance to the drug.  Benefits Improved effectiveness of opioids. Decreased opioid dose needed to achieve benefits. Improved pain with lesser dose.  What is tolerance? Tolerance: is the progressive decreased in effectiveness of a drug due to its repetitive use. With repetitive use, the body gets use to the medication and as a consequence, it loses its effectiveness. This is a common problem seen with opioid pain medications. As a result, a larger dose of the drug is needed to achieve the same effect that used to be obtained with a smaller dose.  How long should a "Drug Holiday" last? You should stay off of the pain medicine for at least 14 consecutive days. (2 weeks)  Should I stop the medicine "cold turkey"? No. You should always coordinate with your Pain Specialist so that he/she can provide you with the correct medication dose to make the transition as smoothly as possible.  How do I stop the medicine? Slowly. You will be instructed to decrease the daily amount of pills that you take by one (1) pill every seven (7) days. This is called a "slow downward taper" of your dose. For example: if you normally take four (4) pills per day, you will  be asked to drop this dose to three (3) pills per day for seven (7) days, then to two (2) pills per day for seven (7) days, then to one (1) per day for seven (7) days, and at the end of those last seven (7) days, this is when the "Drug Holiday" would start.   Will I have withdrawals? By doing a "slow downward taper" like this one, it is unlikely that you will experience any significant withdrawal symptoms. Typically, what triggers withdrawals is the sudden stop of a high dose opioid therapy. Withdrawals can usually be avoided by slowly decreasing the dose over a prolonged period of time. If you do not follow these instructions and decide to stop your medication abruptly, withdrawals may be possible.  What are withdrawals? Withdrawals: refers to the wide range of symptoms that occur after stopping or dramatically reducing opiate drugs after heavy and prolonged use. Withdrawal symptoms do not occur to patients that use low dose opioids, or those who take the medication sporadically. Contrary to benzodiazepine (example: Valium, Xanax, etc.) or alcohol withdrawals ("Delirium Tremens"), opioid withdrawals are not lethal. Withdrawals are the physical manifestation of the body getting rid of the excess receptors.  Expected Symptoms Early symptoms of withdrawal may include: Agitation Anxiety Muscle aches Increased tearing Insomnia Runny nose Sweating Yawning  Late symptoms of withdrawal may include: Abdominal cramping Diarrhea Dilated pupils Goose bumps Nausea Vomiting  Will I experience withdrawals? Due to the slow nature of the taper, it is very unlikely that you will experience any.  What is a slow taper? Taper: refers to the gradual decrease in dose.  (Last update: 10/11/2019) ____________________________________________________________________________________________   ____________________________________________________________________________________________  CBD (cannabidiol) &  Delta-8 (Delta-8 tetrahydrocannabinol) WARNING  Intro: Cannabidiol (CBD) and tetrahydrocannabinol (THC), are two natural compounds found in plants of the Cannabis genus. They can both be extracted   from hemp or cannabis. Hemp and cannabis come from the Cannabis sativa plant. Both compounds interact with your body's endocannabinoid system, but they have very different effects. CBD does not produce the high sensation associated with cannabis. Delta-8 tetrahydrocannabinol, also known as delta-8 THC, is a psychoactive substance found in the Cannabis sativa plant, of which marijuana and hemp are two varieties. THC is responsible for the high associated with the illicit use of marijuana.  Applicable to: All individuals currently taking or considering taking CBD (cannabidiol) and, more important, all patients taking opioid analgesic controlled substances (pain medication). (Example: oxycodone; oxymorphone; hydrocodone; hydromorphone; morphine; methadone; tramadol; tapentadol; fentanyl; buprenorphine; butorphanol; dextromethorphan; meperidine; codeine; etc.)  Legal status: CBD remains a Schedule I drug prohibited for any use. CBD is illegal with one exception. In the [place], CBD has a limited Food and Drug Administration (FDA) approval for the treatment of two specific types of epilepsy disorders. Only one CBD product has been approved by the FDA for this purpose: "Epidiolex". FDA is aware that some companies are marketing products containing cannabis and cannabis-derived compounds in ways that violate the Federal Food, Drug and Cosmetic Act (FD&C Act) and that may put the health and safety of consumers at risk. The FDA, a Federal agency, has not enforced the CBD status since 2018. UPDATE: (05/09/2021) The Drug Enforcement Agency (DEA) issued a letter stating that "delta" cannabinoids, including Delta-8-THCO and Delta-9-THCO, synthetically derived from hemp do not qualify as hemp and will be viewed as Schedule I  drugs. (Schedule I drugs, substances, or chemicals are defined as drugs with no currently accepted medical use and a high potential for abuse. Some examples of Schedule I drugs are: heroin, lysergic acid diethylamide (LSD), marijuana (cannabis), 3,4-methylenedioxymethamphetamine (ecstasy), methaqualone, and peyote.) (https://www.dea.gov)  Legality: Some manufacturers ship CBD products nationally, which is illegal. Often such products are sold online and are therefore available throughout the country. CBD is openly sold in head shops and health food stores in some states where such sales have not been explicitly legalized. Selling unapproved products with unsubstantiated therapeutic claims is not only a violation of the law, but also can put patients at risk, as these products have not been proven to be safe or effective. Federal illegality makes it difficult to conduct research on CBD.  Reference: "FDA Regulation of Cannabis and Cannabis-Derived Products, Including Cannabidiol (CBD)" - https://www.fda.gov/news-events/public-health-focus/fda-regulation-cannabis-and-cannabis-derived-products-including-cannabidiol-cbd  Warning: CBD is not FDA approved and has not undergo the same manufacturing controls as prescription drugs.  This means that the purity and safety of available CBD may be questionable. Most of the time, despite manufacturer's claims, it is contaminated with THC (delta-9-tetrahydrocannabinol - the chemical in marijuana responsible for the "HIGH").  When this is the case, the THC contaminant will trigger a positive urine drug screen (UDS) test for Marijuana (carboxy-THC). Because a positive UDS for any illicit substance is a violation of our medication agreement, your opioid analgesics (pain medicine) may be permanently discontinued. The FDA recently put out a warning about 5 things that everyone should be aware of regarding Delta-8 THC: Delta-8 THC products have not been evaluated or approved by  the FDA for safe use and may be marketed in ways that put the public health at risk. The FDA has received adverse event reports involving delta-8 THC-containing products. Delta-8 THC has psychoactive and intoxicating effects. Delta-8 THC manufacturing often involve use of potentially harmful chemicals to create the concentrations of delta-8 THC claimed in the marketplace. The final delta-8 THC product may have potentially   harmful by-products (contaminants) due to the chemicals used in the process. Manufacturing of delta-8 THC products may occur in uncontrolled or unsanitary settings, which may lead to the presence of unsafe contaminants or other potentially harmful substances. Delta-8 THC products should be kept out of the reach of children and pets.  MORE ABOUT CBD  General Information: CBD was discovered in 1940 and it is a derivative of the cannabis sativa genus plants (Marijuana and Hemp). It is one of the 113 identified substances found in Marijuana. It accounts for up to 40% of the plant's extract. As of 2018, preliminary clinical studies on CBD included research for the treatment of anxiety, movement disorders, and pain. CBD is available and consumed in multiple forms, including inhalation of smoke or vapor, as an aerosol spray, and by mouth. It may be supplied as an oil containing CBD, capsules, dried cannabis, or as a liquid solution. CBD is thought not to be as psychoactive as THC (delta-9-tetrahydrocannabinol - the chemical in marijuana responsible for the "HIGH"). Studies suggest that CBD may interact with different biological target receptors in the body, including cannabinoid and other neurotransmitter receptors. As of 2018 the mechanism of action for its biological effects has not been determined.  Side-effects  Adverse reactions: Dry mouth, diarrhea, decreased appetite, fatigue, drowsiness, malaise, weakness, sleep disturbances, and others.  Drug interactions: CBC may interact with other  medications such as blood-thinners. Because CBD causes drowsiness on its own, it also increases the drowsiness caused by other medications, including antihistamines (such as Benadryl), benzodiazepines (Xanax, Ativan, Valium), antipsychotics, antidepressants and opioids, as well as alcohol and supplements such as kava, melatonin and St. John's Wort. Be cautious with the following combinations:   Brivaracetam (Briviact) Brivaracetam is changed and broken down by the body. CBD might decrease how quickly the body breaks down brivaracetam. This might increase levels of brivaracetam in the body.  Caffeine Caffeine is changed and broken down by the body. CBD might decrease how quickly the body breaks down caffeine. This might increase levels of caffeine in the body.  Carbamazepine (Tegretol) Carbamazepine is changed and broken down by the body. CBD might decrease how quickly the body breaks down carbamazepine. This might increase levels of carbamazepine in the body and increase its side effects.  Citalopram (Celexa) Citalopram is changed and broken down by the body. CBD might decrease how quickly the body breaks down citalopram. This might increase levels of citalopram in the body and increase its side effects.  Clobazam (Onfi) Clobazam is changed and broken down by the liver. CBD might decrease how quickly the liver breaks down clobazam. This might increase the effects and side effects of clobazam.  Eslicarbazepine (Aptiom) Eslicarbazepine is changed and broken down by the body. CBD might decrease how quickly the body breaks down eslicarbazepine. This might increase levels of eslicarbazepine in the body by a small amount.  Everolimus (Zostress) Everolimus is changed and broken down by the body. CBD might decrease how quickly the body breaks down everolimus. This might increase levels of everolimus in the body.  Lithium Taking higher doses of CBD might increase levels of lithium. This can increase  the risk of lithium toxicity.  Medications changed by the liver (Cytochrome P450 1A1 (CYP1A1) substrates) Some medications are changed and broken down by the liver. CBD might change how quickly the liver breaks down these medications. This could change the effects and side effects of these medications.  Medications changed by the liver (Cytochrome P450 1A2 (CYP1A2) substrates) Some medications are   changed and broken down by the liver. CBD might change how quickly the liver breaks down these medications. This could change the effects and side effects of these medications.  Medications changed by the liver (Cytochrome P450 1B1 (CYP1B1) substrates) Some medications are changed and broken down by the liver. CBD might change how quickly the liver breaks down these medications. This could change the effects and side effects of these medications.  Medications changed by the liver (Cytochrome P450 2A6 (CYP2A6) substrates) Some medications are changed and broken down by the liver. CBD might change how quickly the liver breaks down these medications. This could change the effects and side effects of these medications.  Medications changed by the liver (Cytochrome P450 2B6 (CYP2B6) substrates) Some medications are changed and broken down by the liver. CBD might change how quickly the liver breaks down these medications. This could change the effects and side effects of these medications.  Medications changed by the liver (Cytochrome P450 2C19 (CYP2C19) substrates) Some medications are changed and broken down by the liver. CBD might change how quickly the liver breaks down these medications. This could change the effects and side effects of these medications.  Medications changed by the liver (Cytochrome P450 2C8 (CYP2C8) substrates) Some medications are changed and broken down by the liver. CBD might change how quickly the liver breaks down these medications. This could change the effects and side effects  of these medications.  Medications changed by the liver (Cytochrome P450 2C9 (CYP2C9) substrates) Some medications are changed and broken down by the liver. CBD might change how quickly the liver breaks down these medications. This could change the effects and side effects of these medications.  Medications changed by the liver (Cytochrome P450 2D6 (CYP2D6) substrates) Some medications are changed and broken down by the liver. CBD might change how quickly the liver breaks down these medications. This could change the effects and side effects of these medications.  Medications changed by the liver (Cytochrome P450 2E1 (CYP2E1) substrates) Some medications are changed and broken down by the liver. CBD might change how quickly the liver breaks down these medications. This could change the effects and side effects of these medications.  Medications changed by the liver (Cytochrome P450 3A4 (CYP3A4) substrates) Some medications are changed and broken down by the liver. CBD might change how quickly the liver breaks down these medications. This could change the effects and side effects of these medications.  Medications changed by the liver (Glucuronidated drugs) Some medications are changed and broken down by the liver. CBD might change how quickly the liver breaks down these medications. This could change the effects and side effects of these medications.  Medications that decrease the breakdown of other medications by the liver (Cytochrome P450 2C19 (CYP2C19) inhibitors) CBD is changed and broken down by the liver. Some drugs decrease how quickly the liver changes and breaks down CBD. This could change the effects and side effects of CBD.  Medications that decrease the breakdown of other medications in the liver (Cytochrome P450 3A4 (CYP3A4) inhibitors) CBD is changed and broken down by the liver. Some drugs decrease how quickly the liver changes and breaks down CBD. This could change the effects  and side effects of CBD.  Medications that increase breakdown of other medications by the liver (Cytochrome P450 3A4 (CYP3A4) inducers) CBD is changed and broken down by the liver. Some drugs increase how quickly the liver changes and breaks down CBD. This could change the effects and side effects of   CBD.  Medications that increase the breakdown of other medications by the liver (Cytochrome P450 2C19 (CYP2C19) inducers) CBD is changed and broken down by the liver. Some drugs increase how quickly the liver changes and breaks down CBD. This could change the effects and side effects of CBD.  Methadone (Dolophine) Methadone is broken down by the liver. CBD might decrease how quickly the liver breaks down methadone. Taking cannabidiol along with methadone might increase the effects and side effects of methadone.  Rufinamide (Banzel) Rufinamide is changed and broken down by the body. CBD might decrease how quickly the body breaks down rufinamide. This might increase levels of rufinamide in the body by a small amount.  Sedative medications (CNS depressants) CBD might cause sleepiness and slowed breathing. Some medications, called sedatives, can also cause sleepiness and slowed breathing. Taking CBD with sedative medications might cause breathing problems and/or too much sleepiness.  Sirolimus (Rapamune) Sirolimus is changed and broken down by the body. CBD might decrease how quickly the body breaks down sirolimus. This might increase levels of sirolimus in the body.  Stiripentol (Diacomit) Stiripentol is changed and broken down by the body. CBD might decrease how quickly the body breaks down stiripentol. This might increase levels of stiripentol in the body and increase its side effects.  Tacrolimus (Prograf) Tacrolimus is changed and broken down by the body. CBD might decrease how quickly the body breaks down tacrolimus. This might increase levels of tacrolimus in the body.  Tamoxifen  (Soltamox) Tamoxifen is changed and broken down by the body. CBD might affect how quickly the body breaks down tamoxifen. This might affect levels of tamoxifen in the body.  Topiramate (Topamax) Topiramate is changed and broken down by the body. CBD might decrease how quickly the body breaks down topiramate. This might increase levels of topiramate in the body by a small amount.  Valproate Valproic acid can cause liver injury. Taking cannabidiol with valproic acid might increase the chance of liver injury. CBD and/or valproic acid might need to be stopped, or the dose might need to be reduced.  Warfarin (Coumadin) CBD might increase levels of warfarin, which can increase the risk for bleeding. CBD and/or warfarin might need to be stopped, or the dose might need to be reduced.  Zonisamide Zonisamide is changed and broken down by the body. CBD might decrease how quickly the body breaks down zonisamide. This might increase levels of zonisamide in the body by a small amount. (Last update: 05/21/2021) ____________________________________________________________________________________________  Naloxone Nasal Spray What is this medication? NALOXONE (nal OX one) treats opioid overdose, which causes slow or shallow breathing, severe drowsiness, or trouble staying awake. Call emergency services after using this medication. You may need additional treatment. Naloxone works by reversing the effects of opioids. It belongs to a group of medications called opioid blockers. This medicine may be used for other purposes; ask your health care provider or pharmacist if you have questions. COMMON BRAND NAME(S): Kloxxado, Narcan What should I tell my care team before I take this medication? They need to know if you have any of these conditions: Heart disease Substance use disorder An unusual or allergic reaction to naloxone, other medications, foods, dyes, or preservatives Pregnant or trying to get  pregnant Breast-feeding How should I use this medication? This medication is for use in the nose. Lay the person on their back. Support their neck with your hand and allow the head to tilt back before giving the medication. The nasal spray should be given into   1 nostril. After giving the medication, move the person onto their side. Do not remove or test the nasal spray until ready to use. Get emergency medical help right away after giving the first dose of this medication, even if the person wakes up. You should be familiar with how to recognize the signs and symptoms of a narcotic overdose. If more doses are needed, give the additional dose in the other nostril. Talk to your care team about the use of this medication in children. While this medication may be prescribed for children as young as newborns for selected conditions, precautions do apply. Overdosage: If you think you have taken too much of this medicine contact a poison control center or emergency room at once. NOTE: This medicine is only for you. Do not share this medicine with others. What if I miss a dose? This does not apply. What may interact with this medication? This is only used during an emergency. No interactions are expected during emergency use. This list may not describe all possible interactions. Give your health care provider a list of all the medicines, herbs, non-prescription drugs, or dietary supplements you use. Also tell them if you smoke, drink alcohol, or use illegal drugs. Some items may interact with your medicine. What should I watch for while using this medication? Keep this medication ready for use in the case of an opioid overdose. Make sure that you have the phone number of your care team and local hospital ready. You may need to have additional doses of this medication. Each nasal spray contains a single dose. Some emergencies may require additional doses. After use, bring the treated person to the nearest  hospital or call 911. Make sure the treating care team knows that the person has received a dose of this medication. You will receive additional instructions on what to do during and after use of this medication before an emergency occurs. What side effects may I notice from receiving this medication? Side effects that you should report to your care team as soon as possible: Allergic reactions--skin rash, itching, hives, swelling of the face, lips, tongue, or throat Side effects that usually do not require medical attention (report these to your care team if they continue or are bothersome): Constipation Dryness or irritation inside the nose Headache Increase in blood pressure Muscle spasms Stuffy nose Toothache This list may not describe all possible side effects. Call your doctor for medical advice about side effects. You may report side effects to FDA at 1-800-FDA-1088. Where should I keep my medication? Keep out of the reach of children and pets. Store between 20 and 25 degrees C (68 and 77 degrees F). Do not freeze. Throw away any unused medication after the expiration date. Keep in original box until ready to use. NOTE: This sheet is a summary. It may not cover all possible information. If you have questions about this medicine, talk to your doctor, pharmacist, or health care provider.  2023 [employer]/Gold Standard (2020-11-15 00:00:00)  

## 2022-01-20 NOTE — Progress Notes (Unsigned)
PROVIDER NOTE: Information contained herein reflects review and annotations entered in association with encounter. Interpretation of such information and data should be left to medically-trained personnel. Information provided to patient can be located elsewhere in the medical record under "Patient Instructions". Document created using STT-dictation technology, any transcriptional errors that may result from process are unintentional.    Patient: [name]  Service Category: E/M  Provider: Gaspar Cola, MD  DOB: Apr 12, 1950  DOS: 01/21/2022  Referring Provider: Orland Mustard *  MRN: [id number]  Specialty: Interventional Pain Management  PCP: Carollee Leitz, MD  Type: Established Patient  Setting: Ambulatory outpatient    Location: Office  Delivery: Face-to-face     HPI  [name], a 71 y.o. year old adult, is here today because of her Chronic pain syndrome [G89.4]. Ms. Davidow primary complain today is No chief complaint on file. Last encounter: My last encounter with her was on 10/29/2021. Pertinent problems: Ms. Bert has Diabetic peripheral neuropathy associated with type 2 diabetes mellitus ([place]); Other shoulder lesions (Right); Neuropathy of lateral femoral cutaneous nerve (Right); Arthralgia; Osteoarthritis involving multiple joints; Status post reverse total shoulder replacement; Chronic pain syndrome; Chronic shoulder pain (Right); Carpal tunnel syndrome; Cervical radiculitis; Cubital tunnel syndrome (Right); Impingement syndrome of shoulder region; Pain in joint involving ankle and foot; Spinal stenosis of thoracic region; Hip joint pain (Left); Abnormality of gait and mobility; Diabetic ulcer of left foot ([place]); Muscle strain of hip (Left); Hip strain (Right); Ischial pain, right; Fibromyalgia syndrome; Chronic musculoskeletal pain; Peripheral vascular disease ([place]); Chronic ulcer of right leg ([place]); Meralgia paresthetica (Right); Chronic pain of right ankle;  Arthritis; Left ankle pain; Chronic shoulder pain (Left); Left hemiparesis ([place]); Primary osteoarthritis of left shoulder; Rotator cuff disorder, left; Ulcer of left heel and midfoot (HCC); PAD (peripheral artery disease) ([place]); Peripheral neurogenic pain; Chronic peripheral neuropathic pain; Chronic shoulder pain (Bilateral); Impaired range of motion of shoulder (Left); Acute pain of shoulder (Left); and Abnormal MRI, shoulder on their pertinent problem list. Pain Assessment: Severity of   is reported as a  /10. Location:    / . Onset:  . Quality:  . Timing:  . Modifying factor(s):  Marland Kitchen Vitals:  vitals were not taken for this visit.   Reason for encounter: medication management. ***  RTCB:  Nonopioids transferred 02/19/2020: Lyrica and Cymbalta  Pharmacotherapy Assessment  Analgesic: Hydrocodone/APAP 5/325 mg, 1 tab PO QD PRN (5 mg/day of hydrocodone) MME/day: 5 mg/day.   Monitoring: [place] PMP: PDMP reviewed during this encounter.       Pharmacotherapy: No side-effects or adverse reactions reported. Compliance: No problems identified. Effectiveness: Clinically acceptable.  No notes on file  No results found for: "CBDTHCR" No results found for: "D8THCCBX" No results found for: "D9THCCBX"  UDS:  Summary  Date Value Ref Range Status  10/29/2021 Note  Final    Comment:    ==================================================================== [employer] [facility] (MW) ==================================================================== Test                             Result       Flag       Units  Drug Present and Declared for Prescription Verification   Alprazolam                     45           EXPECTED   ng/mg creat   Alpha-hydroxyalprazolam  62           EXPECTED   ng/mg creat    Source of alprazolam is a scheduled prescription medication. Alpha-    hydroxyalprazolam is an expected metabolite of alprazolam.    Hydrocodone                    652          EXPECTED   ng/mg creat    Dihydrocodeine                 232          EXPECTED   ng/mg creat   Norhydrocodone                 537          EXPECTED   ng/mg creat    Sources of hydrocodone include scheduled prescription medications.    Dihydrocodeine and norhydrocodone are expected metabolites of    hydrocodone. Dihydrocodeine is also available as a scheduled    prescription medication.  ==================================================================== Test                      Result    Flag   Units      Ref Range   Creatinine              65               mg/dL      >=20 ==================================================================== Declared Medications:  The flagging and interpretation on this report are based on the  following declared medications.  Unexpected results may arise from  inaccuracies in the declared medications.   **Note: The testing scope of this panel includes these medications:   Alprazolam (Xanax)  Hydrocodone (Norco)   **Note: The testing scope of this panel does not include the  following reported medications:   Acetaminophen (Tylenol)  Acetaminophen (Norco)  Albuterol (Proventil HFA)  Apixaban (Eliquis)  Benzonatate (Tessalon)  Bupropion (Wellbutrin XL)  Calcium  Cholecalciferol  Clobetasol (Temovate)  Colchicine  Duloxetine (Cymbalta)  Estradiol (Estrace)  Fluconazole (Diflucan)  Fluticasone (Trelegy)  Folic Acid  Furosemide (Lasix)  Levocetirizine (Xyzal)  Levothyroxine (Synthroid)  Losartan (Cozaar)  Metformin (Glucophage)  Metoprolol (Toprol)  Montelukast (Singulair)  Multivitamin  Mupirocin (Bactroban)  Nystatin (Mycostatin)  Ondansetron (Zofran)  Pantoprazole (Protonix)  Pregabalin (Lyrica)  Rosuvastatin (Crestor)  Sitagliptin (Januvia)  Sucralfate (Carafate)  Supplement  Topical  Topical Diclofenac (Voltaren)  Umeclidinium (Trelegy)  Vibegron (Gemtesa)  Vilanterol (Trelegy)  Vitamin B1  Vitamin D  Vitamin  D2 ==================================================================== For clinical consultation, please call [phone]. ====================================================================       ROS  Constitutional: Denies any fever or chills Gastrointestinal: No reported hemesis, hematochezia, vomiting, or acute GI distress Musculoskeletal: Denies any acute onset joint swelling, redness, loss of ROM, or weakness Neurological: No reported episodes of acute onset apraxia, aphasia, dysarthria, agnosia, amnesia, paralysis, loss of coordination, or loss of consciousness  Medication Review  ALPRAZolam, DULoxetine, Fluticasone-Umeclidin-Vilant, [place] No Mess Body Powder, HYDROcodone-acetaminophen, Semaglutide (1 MG/DOSE), Semaglutide (2 MG/DOSE), Semaglutide(0.25 or 0.5MG/DOS), Tub Transfer Board, Vibegron, acetaminophen, albuterol, apixaban, buPROPion, calcium-vitamin D, cephALEXin, clobetasol cream, colchicine, diclofenac Sodium, ergocalciferol, estradiol, folic acid, furosemide, levocetirizine, levothyroxine, losartan, metoprolol succinate, montelukast, multivitamin with minerals, mupirocin ointment, nystatin cream, ondansetron, pantoprazole, pregabalin, rosuvastatin, sucralfate, thiamine, and vitamin D3  History Review  Allergy: Ms. Hedtke is allergic to morphine and related, penicillins, ambien [zolpidem], aspirin, and oxytetracycline. Drug: Ms. Ferdig  reports  no history of drug use. Alcohol:  reports no history of alcohol use. Tobacco:  reports that she has never smoked. She has never used smokeless tobacco. Social: Ms. Choi  reports that she has never smoked. She has never used smokeless tobacco. She reports that she does not drink alcohol and does not use drugs. Medical:  has a past medical history of Abnormal antibody titer, Anxiety and depression, Arthritis, Asthma, Atypical chest pain, Carotid arterial disease ([place]), Chronic heart failure with preserved ejection fraction  (HFpEF) ([place]), CKD (chronic kidney disease), stage III ([place]), Cystocele, Depression, Diabetes ([place]), DVT (deep venous thrombosis) ([place]), Dyspnea, Edema, GERD (gastroesophageal reflux disease), Heart murmur, History of IBS, History of kidney stones, History of methicillin resistant staphylococcus aureus (MRSA) (2008), History of multiple strokes, Hyperlipidemia, Hypertension, Hypothyroidism, Insomnia, Kidney stone, Mitral regurgitation, Neuropathy involving both lower extremities, Non-healing ulcer of left foot ([place]), Obesity, Class II, BMI 35-39.9, with comorbidity, PAF (paroxysmal atrial fibrillation) ([place]), PAH (pulmonary artery hypertension) ([place]), Peripheral vascular disease ([place]), Sleep apnea, Stroke ([place]), Tricuspid regurgitation, Urinary incontinence, and Venous stasis dermatitis of both lower extremities. Surgical: Ms. Newhard  has a past surgical history that includes Ankle surgery (Right); Vaginal hysterectomy; Sleeve Gastroplasty; Lithotripsy; Kidney surgery (Right); transthoracic echocardiogram (08/03/2013); transesophageal echocardiogram (08/08/2013); Tonsillectomy; Total knee arthroplasty (Left); Hiatal hernia repair; Cholecystectomy; I & D extremity (Left, 06/27/2015); Application if wound vac (Left, 06/27/2015); removal of left hematoma (Left); NM GATED MYOVIEW [facility] HX) (February 2017); Reverse shoulder arthroplasty (Right, 10/08/2015); Hernia repair; Ulnar nerve transposition (Right, 08/06/2016); arm surgery; Irrigation and debridement abscess (Left, 09/07/2016); Incision and drainage of wound (Left, 09/25/2016); Application if wound vac (Left, 09/25/2016); Colonoscopy; Upper gi endoscopy; Esophagogastroduodenoscopy (egd) with propofol (N/A, 01/11/2017); Colonoscopy with propofol (N/A, 01/11/2017); Colonoscopy with propofol (N/A, 02/22/2017); OTHER SURGICAL HISTORY; Total shoulder replacement (Right); Joint replacement (2013); Colonoscopy with propofol (N/A, 05/31/2017); Lower Extremity Angiography (Left,  04/04/2018); Esophagogastroduodenoscopy (egd) with propofol (N/A, 10/25/2018); Incision and drainage of wound (Left, 11/09/2018); and Esophagogastroduodenoscopy (egd) with propofol (N/A, 11/29/2019). Family: family history includes Alcohol abuse in her father; Arthritis in her maternal grandfather, maternal grandmother, mother, paternal grandfather, and paternal grandmother; Breast cancer in her maternal aunt; Cancer in her father and maternal aunt; Cerebral aneurysm in her father; Diabetes in her father, maternal grandmother, and paternal grandmother; Heart disease in her maternal grandfather; Hypertension in her father, maternal grandfather, maternal grandmother, paternal grandfather, and paternal grandmother; Kidney disease in her mother; Mental illness in her sister; Peripheral vascular disease in her father; Skin cancer in her father; Varicose Veins in her mother.  Laboratory Chemistry Profile   Renal Lab Results  Component Value Date   BUN 27 (H) 11/27/2021   CREATININE 1.30 (H) 11/27/2021   LABCREA 48 08/22/2020   BCR 27 06/20/2021   GFR 41.16 (L) 11/27/2021   GFRAA 58 (L) 12/25/2019   GFRNONAA 46 (L) 07/30/2021    Hepatic Lab Results  Component Value Date   AST 16 11/27/2021   ALT 15 11/27/2021   ALBUMIN 4.2 11/27/2021   ALKPHOS 80 11/27/2021   LIPASE 27 07/28/2018   AMMONIA <9 (L) 02/07/2020    Electrolytes Lab Results  Component Value Date   NA 139 11/27/2021   K 5.0 11/27/2021   CL 101 11/27/2021   CALCIUM 9.5 11/27/2021   MG 1.9 06/08/2021   PHOS 4.0 06/08/2021    Bone Lab Results  Component Value Date   VD25OH 54.75 11/27/2021    Inflammation (CRP: Acute Phase) (ESR: Chronic Phase) Lab Results  Component  Value Date   CRP 3.1 (H) 04/08/2020   ESRSEDRATE 60 (H) 04/08/2020   LATICACIDVEN 1.1 12/20/2019         Note: Above Lab results reviewed.  Recent Imaging Review  CT ABDOMEN PELVIS WO CONTRAST CLINICAL DATA:  Abdominal pain, acute, nonlocalized  EXAM: CT  ABDOMEN AND PELVIS WITHOUT CONTRAST  TECHNIQUE: Multidetector CT imaging of the abdomen and pelvis was performed following the standard protocol without IV contrast.  RADIATION DOSE REDUCTION: This exam was performed according to the departmental dose-optimization program which includes automated exposure control, adjustment of the mA and/or kV according to patient size and/or use of iterative reconstruction technique.  COMPARISON:  02/07/2020, 08/12/2017  FINDINGS: Lower chest: Mild right basilar atelectasis. Cardiac size within normal limits. Central pulmonary arteries are enlarged in keeping with changes of pulmonary arterial hypertension. Small hiatal hernia.  Hepatobiliary: No focal liver abnormality is seen. Status post cholecystectomy. No biliary dilatation.  Pancreas: Unremarkable  Spleen: Unremarkable  Adrenals/Urinary Tract: Bilateral macroscopic fat containing adrenal masses are again identified in keeping with bilateral adrenal myelolipoma. Since remote prior examination, these demonstrate interval increase in size, now measuring 9.4 cm on the left and 9.3 cm in greatest dimension on the right. The kidneys are normal in size and position. No hydronephrosis. Nonobstructing 6 mm calculus within the left renal pelvis. No additional renal or ureteral calculi. Bladder unremarkable.  Stomach/Bowel: Surgical changes of gastric sleeve resection are identified. The stomach, small bowel, and large bowel are otherwise unremarkable. Appendix normal. No free intraperitoneal gas or fluid.  Vascular/Lymphatic: No significant vascular findings are present. No enlarged abdominal or pelvic lymph nodes.  Reproductive: Status post hysterectomy. No adnexal masses.  Other: Moderate subcutaneous infiltration is seen within the pannus and flanks bilaterally, most commonly reflecting subcutaneous edema. No abdominal wall hernia.  Musculoskeletal: No acute bone abnormality.  Degenerative changes are seen within the thoracolumbar spine. Stable T12 mild compression deformity.  IMPRESSION: No acute intra-abdominal pathology identified. No definite radiographic explanation for the patient's reported symptoms.  Bilateral benign adrenal myelolipomas again identified demonstrating mild interval increase in size since prior examination.  Status post gastric sleeve resection.  Small hiatal hernia.  Electronically Signed   By: Fidela Salisbury M.D.   On: 06/06/2021 21:54 DG Chest 2 View CLINICAL DATA:  Shortness of breath.  EXAM: CHEST - 2 VIEW  COMPARISON:  04/11/2021  FINDINGS: Stable enlargement of the cardiopericardial silhouette. Asymmetric elevation of the right hemidiaphragm is unchanged. No pulmonary edema or pleural effusion. Insert no consolidation status post right shoulder replacement  IMPRESSION: Stable exam. No acute cardiopulmonary findings.  Electronically Signed   By: Misty Stanley M.D.   On: 06/06/2021 18:40 Note: Reviewed        Physical Exam  General appearance: Well nourished, well developed, and well hydrated. In no apparent acute distress Mental status: Alert, oriented x 3 (person, place, & time)       Respiratory: No evidence of acute respiratory distress Eyes: PERLA Vitals: There were no vitals taken for this visit. BMI: Estimated body mass index is 42.64 kg/m as calculated from the following:   Height as of 11/27/21: _0  (1.676 m).   Weight as of 12/23/21: 264 lb 3.2 oz (119.8 kg). Ideal: Patient weight not recorded  Assessment   Diagnosis Status  1. Chronic pain syndrome   2. Fibromyalgia syndrome   3. Left hemiparesis ([place])   4. Meralgia paresthetica (Right)   5. Osteoarthritis involving multiple joints   6. Chronic shoulder  pain (Bilateral)   7. Pharmacologic therapy   8. Chronic use of opiate for therapeutic purpose   9. Encounter for medication management   10. Encounter for chronic pain management     Controlled Controlled Controlled   Updated Problems: No problems updated.   Plan of Care  Problem-specific:  No problem-specific Assessment & Plan notes found for this encounter.  Ms. LAAIBAH WARTMAN has a current medication list which includes the following long-term medication(s): albuterol, bupropion, calcium-vitamin d, colchicine, duloxetine, eliquis, furosemide, gemtesa, hydrocodone-acetaminophen, levocetirizine, levothyroxine, losartan, metoprolol succinate, montelukast, pantoprazole, pregabalin, and rosuvastatin.  Pharmacotherapy (Medications Ordered): No orders of the defined types were placed in this encounter.  Orders:  No orders of the defined types were placed in this encounter.  Follow-up plan:   No follow-ups on file.     Interventional Therapies  Risk  Complexity Considerations:   Estimated body mass index is 41.79 kg/m as calculated from the following:   Height as of this encounter: 5' 5.5" (1.664 m).   Weight as of this encounter: 255 lb (115.7 kg). NOTE: Eliquis ANTICOAGULATION (Stop: 3 days  Restart: 6 hours) NO LUMBAR RFA until BMI < 35.   Planned  Pending:      Under consideration:   Diagnostic left suprascapular NB   Completed:   None since before 2017   Completed by other providers:   Therapeutic left IA shoulder joint injection x1 (May 2023) by Dr. Thalia Party [facility])    Therapeutic  Palliative (PRN) options:   None at this time     Recent Visits Date Type Provider Dept  10/29/21 Office Visit Milinda Pointer, MD [facility]  Showing recent visits within past 90 days and meeting all other requirements Future Appointments Date Type Provider Dept  01/21/22 Appointment Milinda Pointer, MD [facility]  Showing future appointments within next 90 days and meeting all other requirements  I discussed the assessment and treatment plan with the patient. The patient was provided an opportunity to ask  questions and all were answered. The patient agreed with the plan and demonstrated an understanding of the instructions.  Patient advised to call back or seek an in-person evaluation if the symptoms or condition worsens.  Duration of encounter: *** minutes.  Total time on encounter, as per AMA guidelines included both the face-to-face and non-face-to-face time personally spent by the physician and/or other qualified health care professional(s) on the day of the encounter (includes time in activities that require the physician or other qualified health care professional and does not include time in activities normally performed by clinical staff). Physician's time may include the following activities when performed: preparing to see the patient (eg, review of tests, pre-charting review of records) obtaining and/or reviewing separately obtained history performing a medically appropriate examination and/or evaluation counseling and educating the patient/family/caregiver ordering medications, tests, or procedures referring and communicating with other health care professionals (when not separately reported) documenting clinical information in the electronic or other health record independently interpreting results (not separately reported) and communicating results to the patient/ family/caregiver care coordination (not separately reported)  Note by: Gaspar Cola, MD Date: 01/21/2022; Time: 8:38 AM

## 2022-01-20 NOTE — Telephone Encounter (Signed)
Will forward to pre op for review, see notes from requesting office to hold eliquis longer.

## 2022-01-21 ENCOUNTER — Ambulatory Visit (HOSPITAL_BASED_OUTPATIENT_CLINIC_OR_DEPARTMENT_OTHER): Payer: Medicare HMO | Admitting: Pain Medicine

## 2022-01-21 ENCOUNTER — Other Ambulatory Visit: Payer: Self-pay | Admitting: Cardiovascular Disease

## 2022-01-21 DIAGNOSIS — Z91199 Patient's noncompliance with other medical treatment and regimen due to unspecified reason: Secondary | ICD-10-CM

## 2022-01-21 DIAGNOSIS — G894 Chronic pain syndrome: Secondary | ICD-10-CM

## 2022-01-21 DIAGNOSIS — Z8673 Personal history of transient ischemic attack (TIA), and cerebral infarction without residual deficits: Secondary | ICD-10-CM

## 2022-01-21 DIAGNOSIS — G8929 Other chronic pain: Secondary | ICD-10-CM

## 2022-01-21 DIAGNOSIS — G8194 Hemiplegia, unspecified affecting left nondominant side: Secondary | ICD-10-CM

## 2022-01-21 DIAGNOSIS — M797 Fibromyalgia: Secondary | ICD-10-CM

## 2022-01-21 DIAGNOSIS — M159 Polyosteoarthritis, unspecified: Secondary | ICD-10-CM

## 2022-01-21 DIAGNOSIS — Z79891 Long term (current) use of opiate analgesic: Secondary | ICD-10-CM

## 2022-01-21 DIAGNOSIS — G5711 Meralgia paresthetica, right lower limb: Secondary | ICD-10-CM

## 2022-01-21 DIAGNOSIS — Z79899 Other long term (current) drug therapy: Secondary | ICD-10-CM

## 2022-01-21 NOTE — Telephone Encounter (Signed)
   Patient Name: [name]  DOB: 03/24/1950 MRN: [id number]  Primary Cardiologist: Kathlyn Sacramento, MD  Chart reviewed as part of pre-operative protocol coverage. Pre-op clearance already addressed by colleagues in earlier phone notes. To summarize recommendations:  -Hold Eliquis 2 days before but the patient has to understand that there is increased risk of stroke with this. -Dr. Fletcher Anon  Will route this bundled recommendation to requesting provider via Epic fax function and remove from pre-op pool. Please call with questions.  Elgie Collard, PA-C 01/21/2022, 9:00 AM

## 2022-01-21 NOTE — Telephone Encounter (Signed)
Pt need refill lancet strips and glucose blood [facility] to [facility]

## 2022-01-22 ENCOUNTER — Other Ambulatory Visit: Payer: Self-pay

## 2022-01-22 MED ORDER — ONETOUCH ULTRA VI STRP: ORAL_STRIP | 1 refills | Status: DC

## 2022-01-22 NOTE — Telephone Encounter (Signed)
Lancets and test strips sent in

## 2022-01-23 ENCOUNTER — Other Ambulatory Visit: Payer: Self-pay

## 2022-01-23 ENCOUNTER — Other Ambulatory Visit: Payer: Self-pay | Admitting: Cardiovascular Disease

## 2022-01-23 DIAGNOSIS — Z8673 Personal history of transient ischemic attack (TIA), and cerebral infarction without residual deficits: Secondary | ICD-10-CM

## 2022-01-23 MED ORDER — ONETOUCH ULTRASOFT LANCETS MISC: 12 refills | Status: DC

## 2022-01-23 NOTE — Telephone Encounter (Signed)
Patient states we called in the lancets and test strips for her but the pharmacy only received the test strips.  Patient states she still needs the lancets.  Patient states she is almost out.  *Patient states her preferred pharmacy is [facility]

## 2022-01-23 NOTE — Telephone Encounter (Signed)
Lancet prescription sent to [facility]

## 2022-01-26 ENCOUNTER — Ambulatory Visit
Admission: RE | Admit: 2022-01-26 | Discharge: 2022-01-26 | Disposition: A | Discharge status: Home / Self Care | Payer: Medicare HMO | Attending: Gastroenterology | Admitting: Gastroenterology

## 2022-01-26 ENCOUNTER — Encounter
Admission: RE | Disposition: A | Discharge status: Home / Self Care | Payer: Self-pay | Source: Home / Self Care | Attending: Gastroenterology

## 2022-01-26 ENCOUNTER — Ambulatory Visit: Payer: Medicare HMO

## 2022-01-26 DIAGNOSIS — K317 Polyp of stomach and duodenum: Secondary | ICD-10-CM | POA: Insufficient documentation

## 2022-01-26 DIAGNOSIS — D131 Benign neoplasm of stomach: Secondary | ICD-10-CM | POA: Diagnosis not present

## 2022-01-26 DIAGNOSIS — K635 Polyp of colon: Secondary | ICD-10-CM | POA: Insufficient documentation

## 2022-01-26 DIAGNOSIS — Z86718 Personal history of other venous thrombosis and embolism: Secondary | ICD-10-CM | POA: Insufficient documentation

## 2022-01-26 DIAGNOSIS — I2721 Secondary pulmonary arterial hypertension: Secondary | ICD-10-CM | POA: Diagnosis not present

## 2022-01-26 DIAGNOSIS — I480 Paroxysmal atrial fibrillation: Secondary | ICD-10-CM | POA: Insufficient documentation

## 2022-01-26 DIAGNOSIS — G4730 Sleep apnea, unspecified: Secondary | ICD-10-CM | POA: Diagnosis not present

## 2022-01-26 DIAGNOSIS — Z8673 Personal history of transient ischemic attack (TIA), and cerebral infarction without residual deficits: Secondary | ICD-10-CM | POA: Diagnosis not present

## 2022-01-26 DIAGNOSIS — I5032 Chronic diastolic (congestive) heart failure: Secondary | ICD-10-CM | POA: Diagnosis not present

## 2022-01-26 DIAGNOSIS — E039 Hypothyroidism, unspecified: Secondary | ICD-10-CM | POA: Diagnosis not present

## 2022-01-26 DIAGNOSIS — Z6835 Body mass index (BMI) 35.0-35.9, adult: Secondary | ICD-10-CM | POA: Diagnosis not present

## 2022-01-26 DIAGNOSIS — E785 Hyperlipidemia, unspecified: Secondary | ICD-10-CM | POA: Diagnosis not present

## 2022-01-26 DIAGNOSIS — J449 Chronic obstructive pulmonary disease, unspecified: Secondary | ICD-10-CM | POA: Diagnosis not present

## 2022-01-26 DIAGNOSIS — K640 First degree hemorrhoids: Secondary | ICD-10-CM | POA: Insufficient documentation

## 2022-01-26 DIAGNOSIS — Z86010 Personal history of colonic polyps: Secondary | ICD-10-CM | POA: Diagnosis not present

## 2022-01-26 DIAGNOSIS — I10 Essential (primary) hypertension: Secondary | ICD-10-CM | POA: Diagnosis not present

## 2022-01-26 DIAGNOSIS — K449 Diaphragmatic hernia without obstruction or gangrene: Secondary | ICD-10-CM | POA: Insufficient documentation

## 2022-01-26 DIAGNOSIS — N183 Chronic kidney disease, stage 3 unspecified: Secondary | ICD-10-CM | POA: Diagnosis not present

## 2022-01-26 DIAGNOSIS — E669 Obesity, unspecified: Secondary | ICD-10-CM | POA: Insufficient documentation

## 2022-01-26 DIAGNOSIS — Z87442 Personal history of urinary calculi: Secondary | ICD-10-CM | POA: Insufficient documentation

## 2022-01-26 DIAGNOSIS — D123 Benign neoplasm of transverse colon: Secondary | ICD-10-CM | POA: Insufficient documentation

## 2022-01-26 DIAGNOSIS — Z96652 Presence of left artificial knee joint: Secondary | ICD-10-CM | POA: Insufficient documentation

## 2022-01-26 DIAGNOSIS — K649 Unspecified hemorrhoids: Secondary | ICD-10-CM | POA: Diagnosis not present

## 2022-01-26 DIAGNOSIS — I130 Hypertensive heart and chronic kidney disease with heart failure and stage 1 through stage 4 chronic kidney disease, or unspecified chronic kidney disease: Secondary | ICD-10-CM | POA: Diagnosis not present

## 2022-01-26 DIAGNOSIS — D509 Iron deficiency anemia, unspecified: Secondary | ICD-10-CM | POA: Insufficient documentation

## 2022-01-26 DIAGNOSIS — K219 Gastro-esophageal reflux disease without esophagitis: Secondary | ICD-10-CM | POA: Diagnosis not present

## 2022-01-26 DIAGNOSIS — E119 Type 2 diabetes mellitus without complications: Secondary | ICD-10-CM | POA: Diagnosis not present

## 2022-01-26 DIAGNOSIS — E1122 Type 2 diabetes mellitus with diabetic chronic kidney disease: Secondary | ICD-10-CM | POA: Insufficient documentation

## 2022-01-26 DIAGNOSIS — Z9049 Acquired absence of other specified parts of digestive tract: Secondary | ICD-10-CM | POA: Diagnosis not present

## 2022-01-26 HISTORY — PX: ESOPHAGOGASTRODUODENOSCOPY (EGD) WITH PROPOFOL: SHX5813

## 2022-01-26 HISTORY — PX: COLONOSCOPY WITH PROPOFOL: SHX5780

## 2022-01-26 LAB — GLUCOSE, CAPILLARY: Glucose-Capillary: 141 mg/dL — ABNORMAL HIGH (ref 70–99)

## 2022-01-26 SURGERY — ESOPHAGOGASTRODUODENOSCOPY (EGD) WITH PROPOFOL
Anesthesia: General

## 2022-01-26 MED ORDER — MIDAZOLAM HCL 5 MG/5ML IJ SOLN
INTRAMUSCULAR | Status: DC | PRN
  Administered 2022-01-26: 1 mg via INTRAVENOUS

## 2022-01-26 MED ORDER — MIDAZOLAM HCL 2 MG/2ML IJ SOLN
INTRAMUSCULAR | Status: AC
  Filled 2022-01-26: qty 2

## 2022-01-26 MED ORDER — PROPOFOL 1000 MG/100ML IV EMUL
INTRAVENOUS | Status: AC
  Filled 2022-01-26: qty 100

## 2022-01-26 MED ORDER — KETAMINE HCL 10 MG/ML IJ SOLN
INTRAMUSCULAR | Status: DC | PRN
  Administered 2022-01-26: 5 mg via INTRAVENOUS
  Administered 2022-01-26: 15 mg via INTRAVENOUS

## 2022-01-26 MED ORDER — LIDOCAINE HCL (PF) 2 % IJ SOLN
INTRAMUSCULAR | Status: AC
  Filled 2022-01-26: qty 5

## 2022-01-26 MED ORDER — PHENYLEPHRINE HCL (PRESSORS) 10 MG/ML IV SOLN
INTRAVENOUS | Status: DC | PRN
  Administered 2022-01-26: 160 ug via INTRAVENOUS

## 2022-01-26 MED ORDER — PROPOFOL 500 MG/50ML IV EMUL
INTRAVENOUS | Status: DC | PRN
  Administered 2022-01-26: 150 ug/kg/min via INTRAVENOUS

## 2022-01-26 MED ORDER — SODIUM CHLORIDE 0.9 % IV SOLN: INTRAVENOUS | Status: DC

## 2022-01-26 MED ORDER — LIDOCAINE HCL (CARDIAC) PF 100 MG/5ML IV SOSY
PREFILLED_SYRINGE | INTRAVENOUS | Status: DC | PRN
  Administered 2022-01-26: 50 mg via INTRAVENOUS

## 2022-01-26 MED ORDER — LIDOCAINE HCL (PF) 2 % IJ SOLN
INTRAMUSCULAR | Status: AC
  Filled 2022-01-26: qty 10

## 2022-01-26 MED ORDER — PROPOFOL 10 MG/ML IV BOLUS
INTRAVENOUS | Status: DC | PRN
  Administered 2022-01-26: 40 mg via INTRAVENOUS
  Administered 2022-01-26: 20 mg via INTRAVENOUS

## 2022-01-26 MED ORDER — KETAMINE HCL 50 MG/5ML IJ SOSY
PREFILLED_SYRINGE | INTRAMUSCULAR | Status: AC
  Filled 2022-01-26: qty 5

## 2022-01-26 MED ORDER — SODIUM CHLORIDE 0.9 % IV SOLN: INTRAVENOUS | Status: DC | PRN

## 2022-01-26 NOTE — Transfer of Care (Addendum)
Immediate Anesthesia Transfer of Care Note  Patient: [name]  Procedure(s) Performed: ESOPHAGOGASTRODUODENOSCOPY (EGD) WITH PROPOFOL COLONOSCOPY WITH PROPOFOL  Patient Location: PACU and Endoscopy Unit  Anesthesia Type:General  Level of Consciousness: drowsy  Airway & Oxygen Therapy: Patient Spontanous Breathing and Patient connected to nasal cannula oxygen  Post-op Assessment: Report given to RN and Post -op Vital signs reviewed and stable  Post vital signs: Reviewed and stable  Last Vitals:  Vitals Value Taken Time  BP    Temp    Pulse    Resp    SpO2      Last Pain: There were no vitals filed for this visit.       Complications: No notable events documented.

## 2022-01-26 NOTE — H&P (Signed)
Outpatient short stay form Pre-procedure 01/26/2022  Dawn Rubenstein, MD  Primary Physician: Carollee Leitz, MD  Reason for visit:  IDA  History of present illness:    71 y/o lady with history of obesity, COPD, hypertension, and hypothyroidism here for EGD/Colonoscopy for IDA. Last colonoscopy in 2019 was overall unremarkable. History of sleeve gastrectomy, hysterectomy, and hernia repair. No first degree relatives with history of GI malignancies. Takes eliquis with last dose being 3 days ago.    Current Facility-Administered Medications:    0.9 %  sodium chloride infusion, , Intravenous, Continuous, Breon Rehm, Hilton Cork, MD, Last Rate: 10 mL/hr at 01/26/22 0730, Restarted at 01/26/22 0732  Medications Prior to Admission  Medication Sig Dispense Refill Last Dose   levothyroxine (SYNTHROID) 137 MCG tablet Take 1 tablet (137 mcg total) by mouth daily before breakfast. 30 minutes do not mix with vitamins or acid reflux medications D/c 150 mcg 90 tablet 3 01/26/2022   losartan (COZAAR) 50 MG tablet Take 1 tablet (50 mg total) by mouth daily. 90 tablet 3 01/26/2022   metoprolol succinate (TOPROL-XL) 25 MG 24 hr tablet Take 0.5 tablets (12.5 mg total) by mouth daily. 45 tablet 3 01/26/2022   pantoprazole (PROTONIX) 40 MG tablet Take 1 tablet (40 mg total) by mouth daily. 30 min before food 90 tablet 3 01/26/2022   acetaminophen (TYLENOL) 325 MG tablet Take 2 tablets (650 mg total) by mouth every 6 (six) hours as needed for mild pain or moderate pain (or Fever >/= 101).      albuterol (PROVENTIL) (2.5 MG/3ML) 0.083% nebulizer solution Take 3 mLs (2.5 mg total) by nebulization every 4 (four) hours as needed for wheezing or shortness of breath. 75 mL 12    ALPRAZolam (XANAX) 0.25 MG tablet Take 1 tablet (0.25 mg total) by mouth 2 (two) times daily as needed for anxiety. See SIG change was taking 1/2 of 0.5 tablet bid prn changed to 0.25 bid prn. D/c 0.5 xanax 60 tablet 2    buPROPion (WELLBUTRIN XL) 150  MG 24 hr tablet Take 1 tablet (150 mg total) by mouth daily. 90 tablet 3    calcium-vitamin D (OSCAL WITH D) 500-200 MG-UNIT tablet Take 1 tablet by mouth 2 (two) times daily with a meal. 60 tablet 0    cephALEXin (KEFLEX) 250 MG capsule Take 1 capsule (250 mg total) by mouth daily. 30 capsule 5    cholecalciferol (VITAMIN D) 25 MCG tablet Take 1 tablet (1,000 Units total) by mouth 2 (two) times daily with a meal. 30 tablet 2    clobetasol cream (TEMOVATE) 1.24 % Apply 1 application topically 2 (two) times daily as needed (skin irritation).       colchicine 0.6 MG tablet Take 1 tablet (0.6 mg total) by mouth as needed. May repeat 1 pill in 1 hr. No more than 3 total pills 1.8 mg in 24 hours as needed gout flare 30 tablet 2    diclofenac Sodium (VOLTAREN) 1 % GEL Apply 2 g topically 3 (three) times daily. 100 g 2    DULoxetine (CYMBALTA) 60 MG capsule Take 1 capsule (60 mg total) by mouth daily. 90 capsule 3    ELIQUIS 5 MG TABS tablet TAKE 1 TABLET BY MOUTH TWICE DAILY 180 tablet 1 01/23/2022   ergocalciferol (VITAMIN D2) 1.25 MG (50000 UT) capsule Take 50,000 Units by mouth once a week.      estradiol (ESTRACE) 0.1 MG/GM vaginal cream Place 1 Applicatorful vaginally every Monday, Wednesday, and Friday. Apply  0.'5mg'$  (pea-sized amount)  just inside the vaginal introitus with a finger-tip on Monday, Wednesday and Friday nights, 42.5 g 11    Fluticasone-Umeclidin-Vilant (TRELEGY ELLIPTA) 200-62.5-25 MCG/ACT AEPB Inhale 1 puff into the lungs daily. 60 each 0    folic acid (FOLVITE) 1 MG tablet Take 1 tablet (1 mg total) by mouth daily. 90 tablet 3    furosemide (LASIX) 20 MG tablet Take 1 tablet (20 mg total) by mouth as directed. Take 1 tablet daily. May take additional 1 tablet If weight gain of greater than 2 pounds in 24 hours or 5 pounds in one week. 90 tablet 3    GEMTESA 75 MG TABS TAKE 1 TABLET BY MOUTH DAILY 30 tablet 11    glucose blood (ONETOUCH ULTRA) test strip TEST UP TO TWICE DAILY 100 each 1     HYDROcodone-acetaminophen (NORCO/VICODIN) 5-325 MG tablet Take 1 tablet by mouth 2 (two) times daily as needed for severe pain. Must last 30 days 45 tablet 0    Lancets (ONETOUCH ULTRASOFT) lancets Use as instructed 100 each 12    levocetirizine (XYZAL) 5 MG tablet TAKE ONE TABLET BY MOUTH AT BEDTIME AS NEEDED FOR ALLERGIES 90 tablet 3    Misc. Devices (TUB TRANSFER BOARD) MISC 1 Device by Does not apply route as needed. 1 each 0    montelukast (SINGULAIR) 10 MG tablet Take 1 tablet (10 mg total) by mouth at bedtime. 90 tablet 3    Multiple Vitamin (MULTIVITAMIN WITH MINERALS) TABS tablet Take 1 tablet by mouth daily.      mupirocin ointment (BACTROBAN) 2 % APPLY TOPICALLY TWICE DAILY TO RIGHT LOWER LEG AS DIRECTED. 22 g 0    nystatin cream (MYCOSTATIN) Apply 1 application. topically 2 (two) times daily. Angles of mouth or skin folds prn 30 g 11    ondansetron (ZOFRAN) 4 MG tablet Take 1 tablet (4 mg total) by mouth every 8 (eight) hours as needed for nausea or vomiting. 90 tablet 1    Powders (GOLD BOND NO MESS BODY POWDER) AERP Apply 1 application. topically daily as needed. Gold bond talc free powder 200 g 11    pregabalin (LYRICA) 50 MG capsule TAKE 1 CAPSULE BY MOUTH 3 TIMES DAILY 90 capsule 2    rosuvastatin (CRESTOR) 20 MG tablet Take 1 tablet (20 mg total) by mouth daily. Office visit required for further refill. 30 tablet 0    Semaglutide, 1 MG/DOSE, 4 MG/3ML SOPN Inject 1 mg into the skin once a week. Start this month 3 after 0.25 weekly x 62monthand 0.5 weekly x 1 month 3 mL 0    Semaglutide, 2 MG/DOSE, (OZEMPIC, 2 MG/DOSE,) 8 MG/3ML SOPN Inject 2 mg into the skin once a week. Start this weekly after week 4. Start after 0.25 weekly x 1 month, then 0.5 weekly x 1 month, then 1 mg weekly x 1 month 3 mL 5    Semaglutide,0.25 or 0.'5MG'$ /DOS, 2 MG/3ML SOPN Inject 0.25 mg into the skin once a week. X 1 month then increase to 0.5 weekly x 1 month then increase to 1 mg weekly x 1 month then  increase to 2 mg weekly x 1 month if tolerating. D/c metformin and Januvia 50 3 mL 1 01/22/2022   sucralfate (CARAFATE) 1 GM/10ML suspension Take 1 g by mouth 3 (three) times daily.       thiamine 100 MG tablet Take 0.5 tablets (50 mg total) by mouth 2 (two) times daily with a meal. (Patient not taking:  Reported on 01/26/2022) 60 tablet 2 Completed Course     Allergies  Allergen Reactions   Morphine And Related Other (See Comments) and Nausea Only    Patient becomes very confused Other reaction(s): Other (See Comments), Other (See Comments) Patient becomes very confused Patient becomes very confused    Penicillins Hives, Shortness Of Breath, Swelling and Other (See Comments)    Facial swelling Has patient had a PCN reaction causing immediate rash, facial/tongue/throat swelling, SOB or lightheadedness with hypotension: Yes Has patient had a PCN reaction causing severe rash involving mucus membranes or skin necrosis: Yes Has patient had a PCN reaction that required hospitalization No Has patient had a PCN reaction occurring within the last 10 years: No If all of the above answers are "NO", then may proceed with Cephalosporin use.  Other reaction(s): Other (See Comments), Other (See Comments) Facial swelling Has patient had a PCN reaction causing immediate rash, facial/tongue/throat swelling, SOB or lightheadedness with hypotension: Yes Has patient had a PCN reaction causing severe rash involving mucus membranes or skin necrosis: Yes Has patient had a PCN reaction that required hospitalization No Has patient had a PCN reaction occurring within the last 10 years: No If all of the above answers are "NO", then may proceed with Cephalosporin use. Facial swelling Has patient had a PCN reaction causing immediate rash, facial/tongue/throat swelling, SOB or lightheadedness with hypotension: Yes Has patient had a PCN reaction causing severe rash involving mucus membranes or skin necrosis: Yes Has  patient had a PCN reaction that required hospitalization No Has patient had a PCN reaction occurring within the last 10 years: No If all of the above answers are "NO", then may proceed with Cephalosporin use. Facial swelling Has patient had a PCN reaction causing immediate rash, facial/tongue/throat swelling, SOB or lightheadedness with hypotension: Yes Has patient had a PCN reaction causing severe rash involving mucus membranes or skin necrosis: Yes Has patient had a PCN reaction that required hospitalization No Has patient had a PCN reaction occurring within the last 10 years: No If all of the above answers are "NO", then may proceed with Cephalosporin use.    Ambien [Zolpidem]     Hallucination    Aspirin Hives   Oxytetracycline Hives    Other reaction(s): Unknown      Past Medical History:  Diagnosis Date   Abnormal antibody titer    Anxiety and depression    Arthritis    Asthma    Atypical chest pain    a. 03/2018 MV: No ischemia/infarct. EF >65%.   Carotid arterial disease ([place])    a. 08/8125 U/S: RICA <51, LICA nl.   Chronic heart failure with preserved ejection fraction (HFpEF) ([place])    a. 08/2013 Echo: EF 60-65%; b. 08/2019 Echo: EF 60-65%; c. 03/2020 Echo: EF 60-65%; d. 05/2021 Echo: EF 55-60%, no rmwa, nl RV fxn, RVSP 54.68mHg. Mildly dil LA, mild MR, mod TR.   CKD (chronic kidney disease), stage III (HCC)    Cystocele    Depression    Diabetes ([place]    DVT (deep venous thrombosis) (HCC)    Righ calf   Dyspnea    11/08/2018 - "more now due to lack of exercise"   Edema    GERD (gastroesophageal reflux disease)    Heart murmur    History of IBS    History of kidney stones    History of methicillin resistant staphylococcus aureus (MRSA) 2008   History of multiple strokes    Hyperlipidemia  Hypertension    Hypothyroidism    Insomnia    Kidney stone    kidney stones with lithotripsy .  [facility]- "adrenal glands"   Mitral regurgitation    a. 05/2021 Echo:  Mild MR.   Neuropathy involving both lower extremities    Non-healing ulcer of left foot (HCC)    Obesity, Class II, BMI 35-39.9, with comorbidity    PAF (paroxysmal atrial fibrillation) ([place])    a. 07/2013 Event monitor: Freq PVCs and 3 short runs of Afib-->CHA2DS2VASc = 7-->Eliquis.   PAH (pulmonary artery hypertension) ([place])    a. 05/2021 Echo: RVSP 54.22mHg.   Peripheral vascular disease ([place]    a. 03/2018 LE Angio: Nl renal arteries. Nl aorta & iliacs. Nl LLE arterial flow; b. 06/2020 ABI's: Nl bilat.   Sleep apnea    use C-PAP   Stroke ([place]    03/2020   Tricuspid regurgitation    a. 05/2021 Echo: Mod TR (in setting of RVSP 54.576mg).   Urinary incontinence    Venous stasis dermatitis of both lower extremities     Review of systems:  Otherwise negative.    Physical Exam  Gen: Alert, oriented. Appears stated age.  HEENT: PERRLA. Lungs: No respiratory distress CV: RRR Abd: soft, benign, no masses Ext: No edema    Planned procedures: Proceed with EGD/colonoscopy. The patient understands the nature of the planned procedure, indications, risks, alternatives and potential complications including but not limited to bleeding, infection, perforation, damage to internal organs and possible oversedation/side effects from anesthesia. The patient agrees and gives consent to proceed.  Please refer to procedure notes for findings, recommendations and patient disposition/instructions.     CaLesly RubensteinMD [facility]

## 2022-01-26 NOTE — Anesthesia Preprocedure Evaluation (Addendum)
Anesthesia Evaluation  Patient identified by MRN, date of birth, ID band Patient awake    Reviewed: Allergy & Precautions, NPO status , Patient's Chart, lab work & pertinent test results  History of Anesthesia Complications Negative for: history of anesthetic complications  Airway Mallampati: III  TM Distance: <3 FB Neck ROM: full    Dental  (+) Chipped   Pulmonary shortness of breath and with exertion, asthma , sleep apnea , COPD   Pulmonary exam normal        Cardiovascular hypertension, Normal cardiovascular exam+ Valvular Problems/Murmurs      Neuro/Psych  PSYCHIATRIC DISORDERS       Neuromuscular disease CVA, Residual Symptoms    GI/Hepatic Neg liver ROS,GERD  Controlled,,  Endo/Other  diabetesHypothyroidism    Renal/GU Renal disease  negative genitourinary   Musculoskeletal   Abdominal   Peds  Hematology negative hematology ROS (+)   Anesthesia Other Findings Patient reports left shoulder pain and injury at baseline  Past Medical History: No date: Abnormal antibody titer No date: Anxiety and depression No date: Arthritis No date: Asthma No date: Atypical chest pain     Comment:  a. 03/2018 MV: No ischemia/infarct. EF >65%. No date: Carotid arterial disease ([place])     Comment:  a. 07/6385 U/S: RICA <56, LICA nl. No date: Chronic heart failure with preserved ejection fraction  (HFpEF) ([place])     Comment:  a. 08/2013 Echo: EF 60-65%; b. 08/2019 Echo: EF 60-65%; c.              03/2020 Echo: EF 60-65%; d. 05/2021 Echo: EF 55-60%, no               rmwa, nl RV fxn, RVSP 54.48mHg. Mildly dil LA, mild MR,               mod TR. No date: CKD (chronic kidney disease), stage III (HCC) No date: Cystocele No date: Depression No date: Diabetes ([place] No date: DVT (deep venous thrombosis) (HCC)     Comment:  Righ calf No date: Dyspnea     Comment:  11/08/2018 - "more now due to lack of exercise" No date: Edema No date:  GERD (gastroesophageal reflux disease) No date: Heart murmur No date: History of IBS No date: History of kidney stones 2008: History of methicillin resistant staphylococcus aureus (MRSA) No date: History of multiple strokes No date: Hyperlipidemia No date: Hypertension No date: Hypothyroidism No date: Insomnia No date: Kidney stone     Comment:  kidney stones with lithotripsy .  [facility]-               "adrenal glands" No date: Mitral regurgitation     Comment:  a. 05/2021 Echo: Mild MR. No date: Neuropathy involving both lower extremities No date: Non-healing ulcer of left foot (HCC) No date: Obesity, Class II, BMI 35-39.9, with comorbidity No date: PAF (paroxysmal atrial fibrillation) ([place]     Comment:  a. 07/2013 Event monitor: Freq PVCs and 3 short runs of               Afib-->CHA2DS2VASc = 7-->Eliquis. No date: PAH (pulmonary artery hypertension) ([place]     Comment:  a. 05/2021 Echo: RVSP 54.447mg. No date: Peripheral vascular disease ([place]    Comment:  a. 03/2018 LE Angio: Nl renal arteries. Nl aorta &               iliacs. Nl LLE arterial flow; b. 06/2020 ABI's: Nl bilat. No date:  Sleep apnea     Comment:  use C-PAP No date: Stroke [facility])     Comment:  03/2020 No date: Tricuspid regurgitation     Comment:  a. 05/2021 Echo: Mod TR (in setting of RVSP 54.36mHg). No date: Urinary incontinence No date: Venous stasis dermatitis of both lower extremities  Past Surgical History: No date: ANKLE SURGERY; Right 43/05/2949 APPLICATION OF WOUND VAC; Left     Comment:  Procedure: APPLICATION OF WOUND VAC ( POSSIBLE ) ;                Surgeon: JAlgernon Huxley MD;  Location: [facility];  Service:               Vascular;  Laterality: Left; 78/10/4164 APPLICATION OF WOUND VAC; Left     Comment:  Procedure: APPLICATION OF WOUND VAC;  Surgeon: TAlbertine Patricia DPM;  Location: [facility];  Service: Podiatry;                Laterality: Left; No date: arm surgery     Comment:   right   No date: CHOLECYSTECTOMY No date: COLONOSCOPY 01/11/2017: COLONOSCOPY WITH PROPOFOL; N/A     Comment:  Procedure: COLONOSCOPY WITH PROPOFOL;  Surgeon:               SLollie Sails MD;  [facility]                Service: Endoscopy;  Laterality: N/A; 02/22/2017: COLONOSCOPY WITH PROPOFOL; N/A     Comment:  Procedure: COLONOSCOPY WITH PROPOFOL;  Surgeon:               SLollie Sails MD;  Location: [facility];                Service: Endoscopy;  Laterality: N/A; 05/31/2017: COLONOSCOPY WITH PROPOFOL; N/A     Comment:  Procedure: COLONOSCOPY WITH PROPOFOL;  Surgeon:               SLollie Sails MD;  Location: [facility];                Service: Endoscopy;  Laterality: N/A; 01/11/2017: ESOPHAGOGASTRODUODENOSCOPY (EGD) WITH PROPOFOL; N/A     Comment:  Procedure: ESOPHAGOGASTRODUODENOSCOPY (EGD) WITH               PROPOFOL;  Surgeon: SLollie Sails MD;  [facility];  Service: Endoscopy;  Laterality: N/A; 10/25/2018: ESOPHAGOGASTRODUODENOSCOPY (EGD) WITH PROPOFOL; N/A     Comment:  Procedure: ESOPHAGOGASTRODUODENOSCOPY (EGD) WITH               PROPOFOL;  Surgeon: SLollie Sails MD;  [facility];  Service: Endoscopy;  Laterality: N/A; 11/29/2019: ESOPHAGOGASTRODUODENOSCOPY (EGD) WITH PROPOFOL; N/A     Comment:  Procedure: ESOPHAGOGASTRODUODENOSCOPY (EGD) WITH               PROPOFOL;  Surgeon: [place], TBenay Pike MD;  Location:               [facility];  Service: Gastroenterology;  Laterality:               N/A; No date: HERNIA REPAIR     Comment:  umbilical No date: HIATAL HERNIA REPAIR 06/27/2015: I &  D EXTREMITY; Left     Comment:  Procedure: IRRIGATION AND DEBRIDEMENT EXTREMITY                       ( CALF HEMATOMA ) POSSIBLE WOUND VAC;  Surgeon: [name], MD;  Location: [facility];  Service: Vascular;                Laterality: Left; 09/25/2016: INCISION AND DRAINAGE OF  WOUND; Left     Comment:  Procedure: IRRIGATION AND DEBRIDEMENT - PARTIAL               RESECTION OF ACHILLES TENDON WITH WOUND VAC APPLICATION;               Surgeon: [name], DPM;  Location: [facility];                Service: Podiatry;  Laterality: Left; 11/09/2018: INCISION AND DRAINAGE OF WOUND; Left     Comment:  Procedure: Excision of left foot wound with ACell               placement;  Surgeon: Wallace Going, DO;  Location:              [place];  Service: Plastics;  Laterality: Left; 09/07/2016: IRRIGATION AND DEBRIDEMENT ABSCESS; Left     Comment:  Procedure: IRRIGATION AND DEBRIDEMENT ABSCESS LEFT HEEL;              Surgeon: [name], DPM;  Location: [facility];                Service: Podiatry;  Laterality: Left; 2013: JOINT REPLACEMENT     Comment:  left knee replacement No date: KIDNEY SURGERY; Right     Comment:  kidney stones No date: LITHOTRIPSY 04/04/2018: LOWER EXTREMITY ANGIOGRAPHY; Left     Comment:  Procedure: LOWER EXTREMITY ANGIOGRAPHY;  Surgeon: [name], MD;  Location: [place] [facility];  Service:               Cardiovascular;  Laterality: Left; February 2017: NM GATED MYOVIEW [facility] HX)     Comment:  Likely breast attenuation. LOW RISK study. Normal EF               55-60%. No date: OTHER SURGICAL HISTORY     Comment:  08/2016 or 09/2016 surgery on achilles tenso h/o staph               infection heal. Dr. [name] No date: removal of left hematoma; Left     Comment:  leg 10/08/2015: REVERSE SHOULDER ARTHROPLASTY; Right     Comment:  Procedure: REVERSE SHOULDER ARTHROPLASTY;  Surgeon: [name], MD;  Location: [facility];  Service: Orthopedics;               Laterality: Right; No date: SLEEVE GASTROPLASTY No date: TONSILLECTOMY No date: TOTAL KNEE ARTHROPLASTY; Left No date: TOTAL SHOULDER REPLACEMENT; Right 08/08/2013: TRANSESOPHAGEAL ECHOCARDIOGRAM     Comment:  Mild LVH, EF 60-65%. Moderate LA dilation  and mild RA               dilation. Mild MR with no evidence of stenosis and no  evidence of endocarditis. A false chordae is noted. 08/03/2013: TRANSTHORACIC ECHOCARDIOGRAM     Comment:  Mild-Moderate concentric LVH, EF 60-65%. Normal               diastolic function. Mild LA dilation. Mild MR with               possible vegetation  - not confirmed on TEE  08/06/2016: ULNAR NERVE TRANSPOSITION; Right     Comment:  Procedure: ULNAR NERVE DECOMPRESSION/TRANSPOSITION;                Surgeon: [name], MD;  Location: [facility];                Service: Orthopedics;  Laterality: Right; No date: UPPER GI ENDOSCOPY No date: VAGINAL HYSTERECTOMY     Reproductive/Obstetrics negative OB ROS                             Anesthesia Physical Anesthesia Plan  ASA: 3  Anesthesia Plan: General   Post-op Pain Management:    Induction: Intravenous  PONV Risk Score and Plan: Propofol infusion and TIVA  Airway Management Planned: Natural Airway and Nasal Cannula  Additional Equipment:   Intra-op Plan:   Post-operative Plan:   Informed Consent: I have reviewed the patients History and Physical, chart, labs and discussed the procedure including the risks, benefits and alternatives for the proposed anesthesia with the patient or authorized representative who has indicated his/her understanding and acceptance.     Dental Advisory Given  Plan Discussed with: Anesthesiologist, CRNA and Surgeon  Anesthesia Plan Comments: (Patient consented for risks of anesthesia including but not limited to:  - adverse reactions to medications - risk of airway placement if required - damage to eyes, teeth, lips or other oral mucosa - nerve damage due to positioning  - sore throat or hoarseness - Damage to heart, brain, nerves, lungs, other parts of body or loss of life  Patient voiced understanding.)       Anesthesia Quick Evaluation

## 2022-01-26 NOTE — Op Note (Addendum)
[facility] Gastroenterology Patient Name: [name] Procedure Date: 01/26/2022 7:12 AM MRN: [id number] Account #: [id number] Date of Birth: 01-03-1951 Admit Type: Outpatient Age: 71 Room: [facility] ENDO ROOM 3 Gender: Female Note Status: Supervisor Override Instrument Name: Altamese Cabal Endoscope [id number] Procedure:             Upper GI endoscopy Indications:           Iron deficiency anemia Providers:             Andrey Farmer MD, MD Referring MD:          Carollee Leitz Medicines:             Monitored Anesthesia Care Complications:         No immediate complications. Estimated blood loss:                         Minimal. Procedure:             Pre-Anesthesia Assessment:                        - Prior to the procedure, a History and Physical was                         performed, and patient medications and allergies were                         reviewed. The patient is competent. The risks and                         benefits of the procedure and the sedation options and                         risks were discussed with the patient. All questions                         were answered and informed consent was obtained.                         Patient identification and proposed procedure were                         verified by the physician, the nurse, the                         anesthesiologist, the anesthetist and the technician                         in the endoscopy suite. Mental Status Examination:                         alert and oriented. Airway Examination: normal                         oropharyngeal airway and neck mobility. Respiratory                         Examination: clear to auscultation. CV Examination:  normal. Prophylactic Antibiotics: The patient does not                         require prophylactic antibiotics. Prior                         Anticoagulants: The patient has taken Eliquis                          (apixaban), last dose was 3 days prior to procedure.                         ASA Grade Assessment: III - A patient with severe                         systemic disease. After reviewing the risks and                         benefits, the patient was deemed in satisfactory                         condition to undergo the procedure. The anesthesia                         plan was to use monitored anesthesia care (MAC).                         Immediately prior to administration of medications,                         the patient was re-assessed for adequacy to receive                         sedatives. The heart rate, respiratory rate, oxygen                         saturations, blood pressure, adequacy of pulmonary                         ventilation, and response to care were monitored                         throughout the procedure. The physical status of the                         patient was re-assessed after the procedure.                        After obtaining informed consent, the endoscope was                         passed under direct vision. Throughout the procedure,                         the patient's blood pressure, pulse, and oxygen                         saturations were monitored continuously. The Endoscope  was introduced through the mouth, and advanced to the                         second part of duodenum. The upper GI endoscopy was                         accomplished without difficulty. The patient tolerated                         the procedure well. Findings:      The examined esophagus was normal.      A small hiatal hernia was present.      A single 4 mm sessile polyp with no bleeding and no stigmata of recent       bleeding was found in the gastric antrum. The polyp was removed with a       cold snare. Resection and retrieval were complete. Estimated blood loss       was minimal.      The examined duodenum was normal. Impression:             - Normal esophagus.                        - Small hiatal hernia.                        - A single gastric polyp. Resected and retrieved.                        - Normal examined duodenum. Recommendation:        - Discharge patient to home.                        - Resume previous diet.                        - Continue present medications.                        - Await pathology results.                        - Return to referring physician as previously                         scheduled. Procedure Code(s):     --- Professional ---                        [phone], Esophagogastroduodenoscopy, flexible,                         transoral; with removal of tumor(s), polyp(s), or                         other lesion(s) by snare technique Diagnosis Code(s):     --- Professional ---                        K44.9, Diaphragmatic hernia without obstruction or  gangrene                        K31.7, Polyp of stomach and duodenum                        D50.9, Iron deficiency anemia, unspecified CPT copyright 2022 American Medical Association. All rights reserved. The codes documented in this report are preliminary and upon coder review may  be revised to meet current compliance requirements. Andrey Farmer MD, MD 01/26/2022 8:41:16 AM Number of Addenda: 0 Note Initiated On: 01/26/2022 7:12 AM Estimated Blood Loss:  Estimated blood loss was minimal.      [facility]

## 2022-01-26 NOTE — Anesthesia Postprocedure Evaluation (Signed)
Anesthesia Post Note  Patient: [name]  Procedure(s) Performed: ESOPHAGOGASTRODUODENOSCOPY (EGD) WITH PROPOFOL COLONOSCOPY WITH PROPOFOL  Patient location during evaluation: Endoscopy Anesthesia Type: General Level of consciousness: awake and alert Pain management: pain level controlled Vital Signs Assessment: post-procedure vital signs reviewed and stable Respiratory status: spontaneous breathing, nonlabored ventilation, respiratory function stable and patient connected to nasal cannula oxygen Cardiovascular status: blood pressure returned to baseline and stable Postop Assessment: no apparent nausea or vomiting Anesthetic complications: no   No notable events documented.   Last Vitals:  Vitals:   01/26/22 0901 01/26/22 0909  BP: 113/61   Pulse: 75   Resp: 18   Temp:    SpO2: 90% 93%    Last Pain:  Vitals:   01/26/22 0715  TempSrc: Temporal  PainSc: 7                  Precious Haws Tibor Lemmons

## 2022-01-26 NOTE — Interval H&P Note (Signed)
History and Physical Interval Note:  01/26/2022 7:52 AM  [name]  has presented today for surgery, with the diagnosis of IDA,GERD,HX OF ADENOMATPUS POLYP OF COLON,GSTRIC POLYP PHARYNGOESOPHAGEAL DYSPHAGIA.  The various methods of treatment have been discussed with the patient and family. After consideration of risks, benefits and other options for treatment, the patient has consented to  Procedure(s) with comments: ESOPHAGOGASTRODUODENOSCOPY (EGD) WITH PROPOFOL (N/A) - DM COLONOSCOPY WITH PROPOFOL (N/A) as a surgical intervention.  The patient's history has been reviewed, patient examined, no change in status, stable for surgery.  I have reviewed the patient's chart and labs.  Questions were answered to the patient's satisfaction.     [name]  Ok to proceed with EGD/Colonoscopy

## 2022-01-26 NOTE — Op Note (Addendum)
[facility] Gastroenterology Patient Name: [name] Procedure Date: 01/26/2022 7:11 AM MRN: [id number] Account #: [id number] Date of Birth: 1951/01/03 Admit Type: Outpatient Age: 71 Room: [facility] ENDO ROOM 3 Gender: Female Note Status: Supervisor Override Instrument Name: Jasper Riling [id number] Procedure:             Colonoscopy Indications:           Iron deficiency anemia, Adenomatous polyps in the colon Providers:             Andrey Farmer MD, MD Referring MD:          Carollee Leitz Medicines:             Monitored Anesthesia Care Complications:         No immediate complications. Estimated blood loss:                         Minimal. Procedure:             Pre-Anesthesia Assessment:                        - Prior to the procedure, a History and Physical was                         performed, and patient medications and allergies were                         reviewed. The patient is competent. The risks and                         benefits of the procedure and the sedation options and                         risks were discussed with the patient. All questions                         were answered and informed consent was obtained.                         Patient identification and proposed procedure were                         verified by the physician, the nurse, the                         anesthesiologist, the anesthetist and the technician                         in the endoscopy suite. Mental Status Examination:                         alert and oriented. Airway Examination: normal                         oropharyngeal airway and neck mobility. Respiratory                         Examination: clear to auscultation. CV Examination:  normal. Prophylactic Antibiotics: The patient does not                         require prophylactic antibiotics. Prior                         Anticoagulants: The patient has taken Eliquis                          (apixaban), last dose was 3 days prior to procedure.                         ASA Grade Assessment: III - A patient with severe                         systemic disease. After reviewing the risks and                         benefits, the patient was deemed in satisfactory                         condition to undergo the procedure. The anesthesia                         plan was to use monitored anesthesia care (MAC).                         Immediately prior to administration of medications,                         the patient was re-assessed for adequacy to receive                         sedatives. The heart rate, respiratory rate, oxygen                         saturations, blood pressure, adequacy of pulmonary                         ventilation, and response to care were monitored                         throughout the procedure. The physical status of the                         patient was re-assessed after the procedure.                        After obtaining informed consent, the colonoscope was                         passed under direct vision. Throughout the procedure,                         the patient's blood pressure, pulse, and oxygen                         saturations were monitored continuously. The  Colonoscope was introduced through the anus and                         advanced to the the terminal ileum. The colonoscopy                         was performed without difficulty. The patient                         tolerated the procedure well. The quality of the bowel                         preparation was good. The terminal ileum, ileocecal                         valve, appendiceal orifice, and rectum were                         photographed. Findings:      The perianal and digital rectal examinations were normal.      The terminal ileum appeared normal.      Two sessile polyps were found in the transverse colon. The polyps were 3        to 5 mm in size. These polyps were removed with a cold snare. Resection       and retrieval were complete. Estimated blood loss was minimal.      Internal hemorrhoids were found during retroflexion. The hemorrhoids       were Grade I (internal hemorrhoids that do not prolapse).      The exam was otherwise without abnormality on direct and retroflexion       views. Impression:            - The examined portion of the ileum was normal.                        - Two 3 to 5 mm polyps in the transverse colon,                         removed with a cold snare. Resected and retrieved.                        - Internal hemorrhoids.                        - The examination was otherwise normal on direct and                         retroflexion views. Recommendation:        - Discharge patient to home.                        - Resume previous diet.                        - Resume Eliquis (apixaban) at prior dose tomorrow.                        - Await pathology results.                        -  Repeat colonoscopy for surveillance based on                         pathology results.                        - Return to referring physician as previously                         scheduled. Procedure Code(s):     --- Professional ---                        [phone], Colonoscopy, flexible; with removal of                         tumor(s), polyp(s), or other lesion(s) by snare                         technique Diagnosis Code(s):     --- Professional ---                        K64.0, First degree hemorrhoids                        D12.3, Benign neoplasm of transverse colon (hepatic                         flexure or splenic flexure)                        D50.9, Iron deficiency anemia, unspecified CPT copyright 2022 American Medical Association. All rights reserved. The codes documented in this report are preliminary and upon coder review may  be revised to meet current compliance requirements. Andrey Farmer MD, MD 01/26/2022 8:44:28 AM Number of Addenda: 0 Note Initiated On: 01/26/2022 7:11 AM Scope Withdrawal Time: 0 hours 29 minutes 37 seconds  Total Procedure Duration: 0 hours 36 minutes 6 seconds  Estimated Blood Loss:  Estimated blood loss was minimal.      [facility]

## 2022-01-27 ENCOUNTER — Encounter: Payer: Self-pay | Admitting: Gastroenterology

## 2022-01-27 LAB — SURGICAL PATHOLOGY

## 2022-01-28 ENCOUNTER — Encounter: Payer: Medicare HMO | Admitting: Pain Medicine

## 2022-01-29 ENCOUNTER — Ambulatory Visit: Payer: Medicare HMO | Attending: Nurse Practitioner | Admitting: Nurse Practitioner

## 2022-01-29 ENCOUNTER — Encounter: Payer: Self-pay | Admitting: Nurse Practitioner

## 2022-01-29 VITALS — BP 127/76 | HR 71 | Ht 66.0 in | Wt 275.4 lb

## 2022-01-29 DIAGNOSIS — N183 Chronic kidney disease, stage 3 unspecified: Secondary | ICD-10-CM

## 2022-01-29 DIAGNOSIS — I480 Paroxysmal atrial fibrillation: Secondary | ICD-10-CM

## 2022-01-29 DIAGNOSIS — I10 Essential (primary) hypertension: Secondary | ICD-10-CM

## 2022-01-29 DIAGNOSIS — I5033 Acute on chronic diastolic (congestive) heart failure: Secondary | ICD-10-CM

## 2022-01-29 DIAGNOSIS — G4733 Obstructive sleep apnea (adult) (pediatric): Secondary | ICD-10-CM | POA: Diagnosis not present

## 2022-01-29 MED ORDER — FUROSEMIDE 40 MG PO TABS: 40.0000 mg | ORAL_TABLET | ORAL | 3 refills | Status: DC

## 2022-01-29 MED ORDER — ROSUVASTATIN CALCIUM 20 MG PO TABS: 20.0000 mg | ORAL_TABLET | Freq: Every day | ORAL | 1 refills | Status: DC

## 2022-01-29 NOTE — Patient Instructions (Addendum)
Medication Instructions:   START TAKING :  LASIX 40 MG ONCE A DAY   *If you need a refill on your cardiac medications before your next appointment, please call your pharmacy*   Lab Work: NONE ORDERED  TODAY   If you have labs (blood work) drawn today and your tests are completely normal, you will receive your results only by: [place] (if you have MyChart) OR A paper copy in the mail If you have any lab test that is abnormal or we need to change your treatment, we will call you to review the results.   Testing/Procedures: NONE ORDERED  TODAY     Follow-Up: At [facility], you and your health needs are our priority.  As part of our continuing mission to provide you with exceptional heart care, we have created designated Provider Care Teams.  These Care Teams include your primary Cardiologist (physician) and Advanced Practice Providers (APPs -  Physician Assistants and Nurse Practitioners) who all work together to provide you with the care you need, when you need it.  We recommend signing up for the patient portal called "MyChart".  Sign up information is provided on this After Visit Summary.  MyChart is used to connect with patients for Virtual Visits (Telemedicine).  Patients are able to view lab/test results, encounter notes, upcoming appointments, etc.  Non-urgent messages can be sent to your provider as well.   To learn more about what you can do with MyChart, go to [email or web address].    Your next appointment:   1 week(s)  The format for your next appointment:   In Person  Provider:   You will see one of the following Advanced Practice Providers on your designated Care Team:   Murray Hodgkins, NP  Other Instructions   Important Information About Sugar       :

## 2022-01-29 NOTE — Progress Notes (Signed)
Office Visit    Patient Name: [name] Date of Encounter: 01/29/2022  Primary Care Provider:  Carollee Leitz, MD Primary Cardiologist:  Kathlyn Sacramento, MD  Chief Complaint    71 year old female with history of HFpEF, paroxysmal atrial fibrillation, prior strokes, hypertension, hyperlipidemia, diabetes, stage III chronic kidney disease, obesity, chronic pain, DVT, sleep apnea on CPAP, who presents for follow-up HFpEF.  Past Medical History    Past Medical History:  Diagnosis Date   Abnormal antibody titer    Anxiety and depression    Arthritis    Asthma    Atypical chest pain    a. 03/2018 MV: No ischemia/infarct. EF >65%.   Carotid arterial disease ([place])    a. 0/0938 U/S: RICA <18, LICA nl.   Chronic heart failure with preserved ejection fraction (HFpEF) ([place])    a. 08/2013 Echo: EF 60-65%; b. 08/2019 Echo: EF 60-65%; c. 03/2020 Echo: EF 60-65%; d. 05/2021 Echo: EF 55-60%, no rmwa, nl RV fxn, RVSP 54.20mHg. Mildly dil LA, mild MR, mod TR.   CKD (chronic kidney disease), stage III (HCC)    Cystocele    Depression    Diabetes ([facility]    DVT (deep venous thrombosis) (HCC)    Righ calf   Dyspnea    11/08/2018 - "more now due to lack of exercise"   Edema    GERD (gastroesophageal reflux disease)    Heart murmur    History of IBS    History of kidney stones    History of methicillin resistant staphylococcus aureus (MRSA) 2008   History of multiple strokes    Hyperlipidemia    Hypertension    Hypothyroidism    Insomnia    Kidney stone    kidney stones with lithotripsy .  CCatharineKidney- "adrenal glands"   Mitral regurgitation    a. 05/2021 Echo: Mild MR.   Neuropathy involving both lower extremities    Non-healing ulcer of left foot (HCC)    Obesity, Class II, BMI 35-39.9, with comorbidity    PAF (paroxysmal atrial fibrillation) ([place]    a. 07/2013 Event monitor: Freq PVCs and 3 short runs of Afib-->CHA2DS2VASc = 7-->Eliquis.   PAH (pulmonary artery hypertension)  ([place]    a. 05/2021 Echo: RVSP 54.434mg.   Peripheral vascular disease ([place]   a. 03/2018 LE Angio: Nl renal arteries. Nl aorta & iliacs. Nl LLE arterial flow; b. 06/2020 ABI's: Nl bilat.   Sleep apnea    use C-PAP   Stroke ([facility]   03/2020   Tricuspid regurgitation    a. 05/2021 Echo: Mod TR (in setting of RVSP 54.74m92m).   Urinary incontinence    Venous stasis dermatitis of both lower extremities    Past Surgical History:  Procedure Laterality Date   ANKLE SURGERY Right    APPLICATION OF WOUND VAC Left 06/27/2015   Procedure: APPLICATION OF WOUND VAC ( POSSIBLE ) ;  Surgeon: JasAlgernon HuxleyD;  Location: [facility] ORS;  Service: Vascular;  Laterality: Left;   APPLICATION OF WOUND VAC Left 09/25/2016   Procedure: APPLICATION OF WOUND VAC;  Surgeon: TroAlbertine PatriciaPM;  Location: [facility];  Service: Podiatry;  Laterality: Left;   arm surgery     right     CHOLECYSTECTOMY     COLONOSCOPY     COLONOSCOPY WITH PROPOFOL N/A 01/11/2017   Procedure: COLONOSCOPY WITH PROPOFOL;  Surgeon: SkuLollie SailsD;  [facility];  Service: Endoscopy;  Laterality: N/A;   COLONOSCOPY WITH  PROPOFOL N/A 02/22/2017   Procedure: COLONOSCOPY WITH PROPOFOL;  Surgeon: [name], MD;  Location: [facility];  Service: Endoscopy;  Laterality: N/A;   COLONOSCOPY WITH PROPOFOL N/A 05/31/2017   Procedure: COLONOSCOPY WITH PROPOFOL;  Surgeon: [name], MD;  Location: [facility] ENDOSCOPY;  Service: Endoscopy;  Laterality: N/A;   COLONOSCOPY WITH PROPOFOL N/A 01/26/2022   Procedure: COLONOSCOPY WITH PROPOFOL;  Surgeon: [name], MD;  Location: [facility] ENDOSCOPY;  Service: Endoscopy;  Laterality: N/A;   ESOPHAGOGASTRODUODENOSCOPY (EGD) WITH PROPOFOL N/A 01/11/2017   Procedure: ESOPHAGOGASTRODUODENOSCOPY (EGD) WITH PROPOFOL;  Surgeon: [name], MD;  Location: [facility] ENDOSCOPY;  Service: Endoscopy;  Laterality: N/A;   ESOPHAGOGASTRODUODENOSCOPY (EGD) WITH PROPOFOL N/A 10/25/2018    Procedure: ESOPHAGOGASTRODUODENOSCOPY (EGD) WITH PROPOFOL;  Surgeon: [name], MD;  Location: [facility] ENDOSCOPY;  Service: Endoscopy;  Laterality: N/A;   ESOPHAGOGASTRODUODENOSCOPY (EGD) WITH PROPOFOL N/A 11/29/2019   Procedure: ESOPHAGOGASTRODUODENOSCOPY (EGD) WITH PROPOFOL;  Surgeon: Toledo, Benay Pike, MD;  Location: [facility] ENDOSCOPY;  Service: Gastroenterology;  Laterality: N/A;   ESOPHAGOGASTRODUODENOSCOPY (EGD) WITH PROPOFOL N/A 01/26/2022   Procedure: ESOPHAGOGASTRODUODENOSCOPY (EGD) WITH PROPOFOL;  Surgeon: [name], MD;  Location: [facility] ENDOSCOPY;  Service: Endoscopy;  Laterality: N/A;  DM   HERNIA REPAIR     umbilical   HIATAL HERNIA REPAIR     I & D EXTREMITY Left 06/27/2015   Procedure: IRRIGATION AND DEBRIDEMENT EXTREMITY            ( CALF HEMATOMA ) POSSIBLE WOUND VAC;  Surgeon: [name], MD;  Location: [facility] OR[facility];  Service: Vascular;  Laterality: Left;   INCISION AND DRAINAGE OF WOUND Left 09/25/2016   Procedure: IRRIGATION AND DEBRIDEMENT - PARTIAL RESECTION OF ACHILLES TENDON WITH WOUND VAC APPLICATION;  Surgeon: [name], DPM;  Location: [facility];  Service: Podiatry;  Laterality: Left;   INCISION AND DRAINAGE OF WOUND Left 11/09/2018   Procedure: Excision of left foot wound with ACell placement;  Surgeon: Wallace Going, DO;  Location: [place];  Service: Plastics;  Laterality: Left;   IRRIGATION AND DEBRIDEMENT ABSCESS Left 09/07/2016   Procedure: IRRIGATION AND DEBRIDEMENT ABSCESS LEFT HEEL;  Surgeon: [name], DPM;  Location: [facility];  Service: Podiatry;  Laterality: Left;   JOINT REPLACEMENT  2013   left knee replacement   KIDNEY SURGERY Right    kidney stones   LITHOTRIPSY     LOWER EXTREMITY ANGIOGRAPHY Left 04/04/2018   Procedure: LOWER EXTREMITY ANGIOGRAPHY;  Surgeon: [name], MD;  Location: [place] CV LAB;  Service: Cardiovascular;  Laterality: Left;   NM GATED [facility])  February 2017   Likely breast attenuation. LOW RISK  study. Normal EF 55-60%.   OTHER SURGICAL HISTORY     08/2016 or 09/2016 surgery on achilles tenso h/o staph infection heal. Dr. [name]   removal of left hematoma Left    leg   REVERSE SHOULDER ARTHROPLASTY Right 10/08/2015   Procedure: REVERSE SHOULDER ARTHROPLASTY;  Surgeon: [name], MD;  Location: [facility] ORS;  Service: Orthopedics;  Laterality: Right;   SLEEVE GASTROPLASTY     TONSILLECTOMY     TOTAL KNEE ARTHROPLASTY Left    TOTAL SHOULDER REPLACEMENT Right    TRANSESOPHAGEAL ECHOCARDIOGRAM  08/08/2013   Mild LVH, EF 60-65%. Moderate LA dilation and mild RA dilation. Mild MR with no evidence of stenosis and no evidence of endocarditis. A false chordae is noted.   TRANSTHORACIC ECHOCARDIOGRAM  08/03/2013   Mild-Moderate concentric LVH, EF 60-65%. Normal diastolic  function. Mild LA dilation. Mild MR with possible vegetation  - not confirmed on TEE    ULNAR NERVE TRANSPOSITION Right 08/06/2016   Procedure: ULNAR NERVE DECOMPRESSION/TRANSPOSITION;  Surgeon: [name], MD;  Location: [facility];  Service: Orthopedics;  Laterality: Right;   UPPER GI ENDOSCOPY     VAGINAL HYSTERECTOMY      Allergies  Allergies  Allergen Reactions   Morphine And Related Other (See Comments) and Nausea Only    Patient becomes very confused Other reaction(s): Other (See Comments), Other (See Comments) Patient becomes very confused Patient becomes very confused    Penicillins Hives, Shortness Of Breath, Swelling and Other (See Comments)    Facial swelling Has patient had a PCN reaction causing immediate rash, facial/tongue/throat swelling, SOB or lightheadedness with hypotension: Yes Has patient had a PCN reaction causing severe rash involving mucus membranes or skin necrosis: Yes Has patient had a PCN reaction that required hospitalization No Has patient had a PCN reaction occurring within the last 10 years: No If all of the above answers are "NO", then may proceed with Cephalosporin use.  Other  reaction(s): Other (See Comments), Other (See Comments) Facial swelling Has patient had a PCN reaction causing immediate rash, facial/tongue/throat swelling, SOB or lightheadedness with hypotension: Yes Has patient had a PCN reaction causing severe rash involving mucus membranes or skin necrosis: Yes Has patient had a PCN reaction that required hospitalization No Has patient had a PCN reaction occurring within the last 10 years: No If all of the above answers are "NO", then may proceed with Cephalosporin use. Facial swelling Has patient had a PCN reaction causing immediate rash, facial/tongue/throat swelling, SOB or lightheadedness with hypotension: Yes Has patient had a PCN reaction causing severe rash involving mucus membranes or skin necrosis: Yes Has patient had a PCN reaction that required hospitalization No Has patient had a PCN reaction occurring within the last 10 years: No If all of the above answers are "NO", then may proceed with Cephalosporin use. Facial swelling Has patient had a PCN reaction causing immediate rash, facial/tongue/throat swelling, SOB or lightheadedness with hypotension: Yes Has patient had a PCN reaction causing severe rash involving mucus membranes or skin necrosis: Yes Has patient had a PCN reaction that required hospitalization No Has patient had a PCN reaction occurring within the last 10 years: No If all of the above answers are "NO", then may proceed with Cephalosporin use.    Ambien [Zolpidem]     Hallucination    Aspirin Hives   Oxytetracycline Hives    Other reaction(s): Unknown     History of Present Illness    71 year old female with the above complex past medical history including paroxysmal atrial fibrillation, strokes, hypertension, hyperlipidemia, chronic HFpEF, diabetes, obesity, stage III chronic kidney disease, chronic pain, DVT, sleep apnea, chronic left foot ulceration followed by vascular surgery, and obesity.  In the setting of  atypical chest pain, she underwent stress testing in January 2020, which was nonischemic.  In June 2021, she was met with pneumonia, sepsis, and mild troponin elevation.  Echo showed an EF of 60 to 65% without regional wall motion abnormalities, it was felt the troponin elevation was secondary to demand ischemia.  In September 2021, she was admitted with altered mental status and left upper extremity weakness.  MRI showed bilateral, small, multiple acute strokes suggestive of embolic etiology.  She was on Pradaxa at that time and this was transitioned to Eliquis.  Unfortunately, she suffered another stroke in November 2021,  and neurology suspected recurrent stroke due to severe malnutrition given history of gastric sleeve and noncompliance with vitamins.  In January 2022, she was admitted again for altered mental status and concern for stroke, though this was ruled out.  Echo with bubble study showed normal LV function without evidence of ASD or PFO.  In the setting of progressive weight gain and edema, an echo was performed in November 2022, which showed an EF of 55 to 60% with RVSP of 54.5 mmHg, mild MR, and moderate TR.  In March 2023, she required admission secondary to lower extremity cellulitis and acute on chronic HFpEF.  She was treated with IV antibiotics and diuresed 13 pounds during admission.  Ms. Perrow was last seen in cardiology clinic in May 2023 at which time she did remarkably well with improved strength and activity tolerance.  She was noted mild elevation of creatinine to 1.26 on labs at that time and her Lasix was reduced to 20 mg once a day.  She had to reschedule a visit over the Summer, but notes that she have overall been feeling well.  She odes not routinely exercise but denies experiencing DOE.  She has noted an increase in lower ext edema along w/ some blistering and skin tears on her lower legs.  She has been trying to get into wound care, but has not been able to yet.  She has been  on low-dose keflex in the setting of recurrent UTI's and has not had any significant erythema or drainage from her lower legs.  She admits to skipping lasix doses about 2x/wk, usually b/c of having to be on the road for appts or errands.  Though she has refrained from eating potato chips and ranch dip, she has been more regularly snacking on white cheddar popcorn.  She wasn't aware that this has a very high sodium content.  She says that her wt has trended between 258 and 263 lbs on her home scale.  She is 275 today (was 252 following hosp in May).  She denies chest pain, palpitations, pnd, orthopnea, n, v, dizziness, syncope, or early satiety.  She has been feeling more anxious over the past several months due to difficulties w/ her daughter.  Home Medications    Current Outpatient Medications  Medication Sig Dispense Refill   acetaminophen (TYLENOL) 325 MG tablet Take 2 tablets (650 mg total) by mouth every 6 (six) hours as needed for mild pain or moderate pain (or Fever >/= 101).     albuterol (PROVENTIL) (2.5 MG/3ML) 0.083% nebulizer solution Take 3 mLs (2.5 mg total) by nebulization every 4 (four) hours as needed for wheezing or shortness of breath. 75 mL 12   ALPRAZolam (XANAX) 0.25 MG tablet Take 1 tablet (0.25 mg total) by mouth 2 (two) times daily as needed for anxiety. See SIG change was taking 1/2 of 0.5 tablet bid prn changed to 0.25 bid prn. D/c 0.5 xanax 60 tablet 2   buPROPion (WELLBUTRIN XL) 150 MG 24 hr tablet Take 1 tablet (150 mg total) by mouth daily. 90 tablet 3   calcium-vitamin D (OSCAL WITH D) 500-200 MG-UNIT tablet Take 1 tablet by mouth 2 (two) times daily with a meal. 60 tablet 0   cephALEXin (KEFLEX) 250 MG capsule Take 1 capsule (250 mg total) by mouth daily. 30 capsule 5   cholecalciferol (VITAMIN D) 25 MCG tablet Take 1 tablet (1,000 Units total) by mouth 2 (two) times daily with a meal. 30 tablet 2   clobetasol  cream (TEMOVATE) 8.25 % Apply 1 application topically 2 (two)  times daily as needed (skin irritation).      colchicine 0.6 MG tablet Take 1 tablet (0.6 mg total) by mouth as needed. May repeat 1 pill in 1 hr. No more than 3 total pills 1.8 mg in 24 hours as needed gout flare 30 tablet 2   diclofenac Sodium (VOLTAREN) 1 % GEL Apply 2 g topically 3 (three) times daily. 100 g 2   DULoxetine (CYMBALTA) 60 MG capsule Take 1 capsule (60 mg total) by mouth daily. 90 capsule 3   ELIQUIS 5 MG TABS tablet TAKE 1 TABLET BY MOUTH TWICE DAILY 180 tablet 1   ergocalciferol (VITAMIN D2) 1.25 MG (50000 UT) capsule Take 50,000 Units by mouth once a week.     estradiol (ESTRACE) 0.1 MG/GM vaginal cream Place 1 Applicatorful vaginally every Monday, Wednesday, and Friday. Apply 0.67m (pea-sized amount)  just inside the vaginal introitus with a finger-tip on Monday, Wednesday and Friday nights, 42.5 g 11   Fluticasone-Umeclidin-Vilant (TRELEGY ELLIPTA) 200-62.5-25 MCG/ACT AEPB Inhale 1 puff into the lungs daily. 60 each 0   folic acid (FOLVITE) 1 MG tablet Take 1 tablet (1 mg total) by mouth daily. 90 tablet 3   GEMTESA 75 MG TABS TAKE 1 TABLET BY MOUTH DAILY 30 tablet 11   glucose blood (ONETOUCH ULTRA) test strip TEST UP TO TWICE DAILY 100 each 1   HYDROcodone-acetaminophen (NORCO/VICODIN) 5-325 MG tablet Take 1 tablet by mouth 2 (two) times daily as needed for severe pain. Must last 30 days 45 tablet 0   Lancets (ONETOUCH ULTRASOFT) lancets Use as instructed 100 each 12   levocetirizine (XYZAL) 5 MG tablet TAKE ONE TABLET BY MOUTH AT BEDTIME AS NEEDED FOR ALLERGIES 90 tablet 3   levothyroxine (SYNTHROID) 137 MCG tablet Take 1 tablet (137 mcg total) by mouth daily before breakfast. 30 minutes do not mix with vitamins or acid reflux medications D/c 150 mcg 90 tablet 3   losartan (COZAAR) 50 MG tablet Take 1 tablet (50 mg total) by mouth daily. 90 tablet 3   metoprolol succinate (TOPROL-XL) 25 MG 24 hr tablet Take 0.5 tablets (12.5 mg total) by mouth daily. 45 tablet 3   Misc.  Devices (TUB TRANSFER BOARD) MISC 1 Device by Does not apply route as needed. 1 each 0   montelukast (SINGULAIR) 10 MG tablet Take 1 tablet (10 mg total) by mouth at bedtime. 90 tablet 3   Multiple Vitamin (MULTIVITAMIN WITH MINERALS) TABS tablet Take 1 tablet by mouth daily.     mupirocin ointment (BACTROBAN) 2 % APPLY TOPICALLY TWICE DAILY TO RIGHT LOWER LEG AS DIRECTED. 22 g 0   nystatin cream (MYCOSTATIN) Apply 1 application. topically 2 (two) times daily. Angles of mouth or skin folds prn 30 g 11   ondansetron (ZOFRAN) 4 MG tablet Take 1 tablet (4 mg total) by mouth every 8 (eight) hours as needed for nausea or vomiting. 90 tablet 1   pantoprazole (PROTONIX) 40 MG tablet Take 1 tablet (40 mg total) by mouth daily. 30 min before food 90 tablet 3   Powders (GOLD BOND NO MESS BODY POWDER) AERP Apply 1 application. topically daily as needed. Gold bond talc free powder 200 g 11   pregabalin (LYRICA) 50 MG capsule TAKE 1 CAPSULE BY MOUTH 3 TIMES DAILY 90 capsule 2   Semaglutide, 1 MG/DOSE, 4 MG/3ML SOPN Inject 1 mg into the skin once a week. Start this month 3 after 0.25 weekly  x 28monthand 0.5 weekly x 1 month 3 mL 0   Semaglutide, 2 MG/DOSE, (OZEMPIC, 2 MG/DOSE,) 8 MG/3ML SOPN Inject 2 mg into the skin once a week. Start this weekly after week 4. Start after 0.25 weekly x 1 month, then 0.5 weekly x 1 month, then 1 mg weekly x 1 month 3 mL 5   Semaglutide,0.25 or 0.5MG/DOS, 2 MG/3ML SOPN Inject 0.25 mg into the skin once a week. X 1 month then increase to 0.5 weekly x 1 month then increase to 1 mg weekly x 1 month then increase to 2 mg weekly x 1 month if tolerating. D/c metformin and Januvia 50 3 mL 1   sucralfate (CARAFATE) 1 GM/10ML suspension Take 1 g by mouth 3 (three) times daily.      thiamine 100 MG tablet Take 0.5 tablets (50 mg total) by mouth 2 (two) times daily with a meal. 60 tablet 2   furosemide (LASIX) 40 MG tablet Take 1 tablet (40 mg total) by mouth as directed. Take 1 tablet daily.  May take additional 1 tablet If weight gain of greater than 2 pounds in 24 hours or 5 pounds in one week. 90 tablet 3   rosuvastatin (CRESTOR) 20 MG tablet Take 1 tablet (20 mg total) by mouth daily. 90 tablet 1   No current facility-administered medications for this visit.     Review of Systems    Increasing lower ext edema w/ some blistering recently.  Increased anxiety.  She denies c/p, dypsnea, pnd, orthopnea, n, v, dizziness, syncope, or early satiety.  All other systems reviewed and are otherwise negative except as noted above.    Physical Exam    VS:  BP 127/76 (BP Location: Right Arm, Patient Position: Sitting, Cuff Size: Normal)   Pulse 71   Ht _0  (1.676 m)   Wt 275 lb 6.4 oz (124.9 kg)   SpO2 92%   BMI 44.45 kg/m  , BMI Body mass index is 44.45 kg/m.     GEN: Obese, in no acute distress. HEENT: normal. Neck: Supple, obese, difficult to gauge JVP, no bruits/masses. Cardiac: IR, IR, distant, no murmurs, rubs, or gallops. No clubbing, cyanosis, 2+ bilat LEE above her compression socks from the mid calf extending to the mid posterior thighs.  Radials/PT 2+ and equal bilaterally.  Respiratory:  Respirations regular and unlabored, clear to auscultation bilaterally. GI: Obese, soft, nontender, nondistended, BS + x 4. MS: no deformity or atrophy. Skin: warm and dry, no rash. Neuro:  Strength and sensation are intact. Psych: Normal affect.  Accessory Clinical Findings    ECG personally reviewed by me today - Afib, 71 - no acute changes.  Lab Results  Component Value Date   WBC 6.3 12/23/2021   HGB 12.2 12/23/2021   HCT 37.7 12/23/2021   MCV 88.7 12/23/2021   PLT 186 12/23/2021   Lab Results  Component Value Date   CREATININE 1.30 (H) 11/27/2021   BUN 27 (H) 11/27/2021   NA 139 11/27/2021   K 5.0 11/27/2021   CL 101 11/27/2021   CO2 25 11/27/2021   Lab Results  Component Value Date   ALT 15 11/27/2021   AST 16 11/27/2021   ALKPHOS 80 11/27/2021   BILITOT  0.5 11/27/2021   Lab Results  Component Value Date   CHOL 88 05/27/2021   HDL 55.50 05/27/2021   LDLCALC 18 05/27/2021   TRIG 70.0 05/27/2021   CHOLHDL 2 05/27/2021    Lab Results  Component  Value Date   HGBA1C 7.7 (H) 11/27/2021    Assessment & Plan    1.  Acute on chronic HFpEF:  Echo 05/2021 w/ EF of 55-60% and RVSP of 67mHg.  She required admission in 05/2021 for massive volume overload and cellulitis.  She hasn't been seen here since May.  Though she reports feeling well, she has noted an increase in lower ext edema, which is evident on exam today w/ 2+ edema extending to her posterior thighs.  Her wt is up 23 lbs since I saw her in May.  She does not have any evidence of cellulitis today.  She misses lasix doses  ~ 2x/wk.  She continues to snack on salty foods and we discussed the contribution of those to her edema.  She is in rate-controlled afib today. It's unclear if this is also contributing to her volume overload, as she is otw asymptomatic.  I will increase her lasix to 40 mg daily and plan to f/u in 1 wk to re-eval and f/u bmet.  2.  Essential HTN:  stable.  3.  PAF:  Historically, she has been in sinus rhythm w/ a first degree avb and very small amplitude P waves.  Today, she is irregular, and I cannot discern any discrete P waves, thus it appears that she is in afib, which is well-rate-controlled.  Cont ? blocker and eliquis.  As above, it's not clear to what extent this might be playing a role in her increased volume at this time, but I suspect it would be very difficult to keep her in sinus rhythm in the long term r/t body habitus and PAH.    4.  CKD III:  f/u bmet in a week given inc in lasix dose.  5.  DMII:  A1c 7.7 in Sept.  Followed by primary care.  Poor candidate for SGLT2i 2/2 h/o UTIs.  6.  OSA:  compliant w/ CPAP.  7.  Dispo:  f/u in 1 wk w/ BMET @ that time.    CMurray Hodgkins NP 01/29/2022, 12:33 PM

## 2022-02-01 NOTE — Patient Instructions (Signed)
____________________________________________________________________________________________  Patient Information update  To: All of our patients.  Re: Name change.  It has been made official that our current name, "[facility]"   will soon be changed to "[facility]".   The purpose of this change is to eliminate any confusion created by the concept of our practice being a "Medication Management Pain Clinic". In the past this has led to the misconception that we treat pain primarily by the use of prescription medications.  Nothing can be farther from the truth.   Understanding PAIN MANAGEMENT: To further understand what our practice does, you first have to understand that "Pain Management" is a subspecialty that requires additional training once a physician has completed their specialty training, which can be in either Anesthesia, Neurology, Psychiatry, or Physical Medicine and Rehabilitation (PMR). Each one of these contributes to the final approach taken by each physician to the management of their patient's pain. To be a "Pain Management Specialist" you must have first completed one of the specialty trainings below.  Anesthesiologists - trained in clinical pharmacology and interventional techniques such as nerve blockade and regional as well as central neuroanatomy. They are trained to block pain before, during, and after surgical interventions.  Neurologists - trained in the diagnosis and pharmacological treatment of complex neurological conditions, such as Multiple Sclerosis, Parkinson's, spinal cord injuries, and other systemic conditions that may be associated with symptoms that may include but are not limited to pain. They tend to rely primarily on the treatment of chronic pain using prescription medications.  Psychiatrist - trained in conditions affecting the psychosocial wellbeing  of patients including but not limited to depression, anxiety, schizophrenia, personality disorders, addiction, and other substance use disorders that may be associated with chronic pain. They tend to rely primarily on the treatment of chronic pain using prescription medications.   Physical Medicine and Rehabilitation (PMR) physicians, also known as physiatrists - trained to treat a wide variety of medical conditions affecting the brain, spinal cord, nerves, bones, joints, ligaments, muscles, and tendons. Their training is primarily aimed at treating patients that have suffered injuries that have caused severe physical impairment. Their training is primarily aimed at the physical therapy and rehabilitation of those patients. They may also work alongside orthopedic surgeons or neurosurgeons using their expertise in assisting surgical patients to recover after their surgeries.  INTERVENTIONAL PAIN MANAGEMENT is sub-subspecialty of Pain Management.  Our physicians are Board-certified in Anesthesia, Pain Management, and Interventional Pain Management.  This meaning that not only have they been trained and Board-certified in their specialty of Anesthesia, and subspecialty of Pain Management, but they have also received further training in the sub-subspecialty of Interventional Pain Management, in order to become Board-certified as INTERVENTIONAL PAIN MANAGEMENT SPECIALIST.    Mission: Our goal is to use our skills in  INTERVENTIONAL PAIN MANAGEMENT as alternatives to the chronic use of prescription opioid medications for the treatment of pain. To make this more clear, we have changed our name to reflect what we do and offer. We will continue to offer medication management assessment and recommendations, but we will not be taking over any patient's medication management.  ____________________________________________________________________________________________   _______________________________________________________________________  Medication Rules  Purpose: To inform patients, and their family members, of our medication rules and regulations.  Applies to: All patients receiving prescriptions from our practice (written or electronic).  Pharmacy of record: This is the pharmacy where your electronic prescriptions will be sent.   Make sure we have the correct one.  Electronic prescriptions: In compliance with the [place] Strengthen Opioid Misuse Prevention (STOP) Act of 2017 (Session Law 2017-74/H243), effective March 23, 2018, all controlled substances must be electronically prescribed. Written prescriptions, faxing, or calling prescriptions to a pharmacy will no longer be done.  Prescription refills: These will be provided only during in-person appointments. No medications will be renewed without a "face-to-face" evaluation with your provider. Applies to all prescriptions.  NOTE: The following applies primarily to controlled substances (Opioid* Pain Medications).   Type of encounter (visit): For patients receiving controlled substances, face-to-face visits are required. (Not an option and not up to the patient.)  Patient's responsibilities: Pain Pills: Bring all pain pills to every appointment (except for procedure appointments). Pill Bottles: Bring pills in original pharmacy bottle. Bring bottle, even if empty. Always bring the bottle of the most recent fill.  Medication refills: You are responsible for knowing and keeping track of what medications you are taking and when is it that you will need a refill. The day before your appointment: write a list of all prescriptions that need to be refilled. The day of the appointment: give the list to the admitting nurse. Prescriptions will be written only during appointments. No prescriptions will be written on procedure days. If you forget a medication: it will not be "Called in", "Faxed", or  "electronically sent". You will need to get another appointment to get these prescribed. No early refills. Do not call asking to have your prescription filled early. Partial  or short prescriptions: Occasionally your pharmacy may not have enough pills to fill your prescription.  NEVER ACCEPT a partial fill or a prescription that is short of the total amount of pills that you were prescribed.  With controlled substances the law allows 72 hours for the pharmacy to complete the prescription.  If the prescription is not completed within 72 hours, the pharmacist will require a new prescription to be written. This means that you will be short on your medicine and we WILL NOT send another prescription to complete your original prescription.  Instead, request the pharmacy to send a carrier to a nearby branch to get enough medication to provide you with your full prescription. Prescription Accuracy: You are responsible for carefully inspecting your prescriptions before leaving our office. Have the discharge nurse carefully go over each prescription with you, before taking them home. Make sure that your name is accurately spelled, that your address is correct. Check the name and dose of your medication to make sure it is accurate. Check the number of pills, and the written instructions to make sure they are clear and accurate. Make sure that you are given enough medication to last until your next medication refill appointment. Taking Medication: Take medication as prescribed. When it comes to controlled substances, taking less pills or less frequently than prescribed is permitted and encouraged. Never take more pills than instructed. Never take the medication more frequently than prescribed.  Inform other Doctors: Always inform, all of your healthcare providers, of all the medications you take. Pain Medication from other Providers: You are not allowed to accept any additional pain medication from any other Doctor or  Healthcare provider. There are two exceptions to this rule. (see below) In the event that you require additional pain medication, you are responsible for notifying us, as stated below. Cough Medicine: Often these contain an opioid, such as codeine or hydrocodone. Never accept or take cough medicine containing these opioids if   you are already taking an opioid* medication. The combination may cause respiratory failure and death. Medication Agreement: You are responsible for carefully reading and following our Medication Agreement. This must be signed before receiving any prescriptions from our practice. Safely store a copy of your signed Agreement. Violations to the Agreement will result in no further prescriptions. (Additional copies of our Medication Agreement are available upon request.) Laws, Rules, & Regulations: All patients are expected to follow all Federal and State Laws, Statutes, Rules, & Regulations. Ignorance of the Laws does not constitute a valid excuse.  Illegal drugs and Controlled Substances: The use of illegal substances (including, but not limited to marijuana and its derivatives) and/or the illegal use of any controlled substances is strictly prohibited. Violation of this rule may result in the immediate and permanent discontinuation of any and all prescriptions being written by our practice. The use of any illegal substances is prohibited. Adopted CDC guidelines & recommendations: Target dosing levels will be at or below 60 MME/day. Use of benzodiazepines** is not recommended.  Exceptions: There are only two exceptions to the rule of not receiving pain medications from other Healthcare Providers. Exception #1 (Emergencies): In the event of an emergency (i.e.: accident requiring emergency care), you are allowed to receive additional pain medication. However, you are responsible for: As soon as you are able, call our office [phone], at any time of the day or night, and leave a  message stating your name, the date and nature of the emergency, and the name and dose of the medication prescribed. In the event that your call is answered by a member of our staff, make sure to document and save the date, time, and the name of the person that took your information.  Exception #2 (Planned Surgery): In the event that you are scheduled by another doctor or dentist to have any type of surgery or procedure, you are allowed (for a period no longer than 30 days), to receive additional pain medication, for the acute post-op pain. However, in this case, you are responsible for picking up a copy of our "Post-op Pain Management for Surgeons" handout, and giving it to your surgeon or dentist. This document is available at our office, and does not require an appointment to obtain it. Simply go to our office during business hours (Monday-Thursday from 8:00 AM to 4:00 PM) (Friday 8:00 AM to 12:00 Noon) or if you have a scheduled appointment with us, prior to your surgery, and ask for it by name. In addition, you are responsible for: calling our office [phone], at any time of the day or night, and leaving a message stating your name, name of your surgeon, type of surgery, and date of procedure or surgery. Failure to comply with your responsibilities may result in termination of therapy involving the controlled substances. Medication Agreement Violation. Following the above rules, including your responsibilities will help you in avoiding a Medication Agreement Violation ("Breaking your Pain Medication Contract").  Consequences:  Not following the above rules may result in permanent discontinuation of medication prescription therapy.  *Opioid medications include: morphine, codeine, oxycodone, oxymorphone, hydrocodone, hydromorphone, meperidine, tramadol, tapentadol, buprenorphine, fentanyl, methadone. **Benzodiazepine medications include: diazepam (Valium), alprazolam (Xanax), clonazepam (Klonopine),  lorazepam (Ativan), clorazepate (Tranxene), chlordiazepoxide (Librium), estazolam (Prosom), oxazepam (Serax), temazepam (Restoril), triazolam (Halcion) (Last updated: 01/13/2022) ______________________________________________________________________   ______________________________________________________________________  Medication Recommendations and Reminders  Applies to: All patients receiving prescriptions (written and/or electronic).  Medication Rules & Regulations: You are responsible for reading, knowing, and   following our "Medication Rules" document. These exist for your safety and that of others. They are not flexible and neither are we. Dismissing or ignoring them is an act of "non-compliance" that may result in complete and irreversible termination of such medication therapy. For safety reasons, "non-compliance" will not be tolerated. As with the [place] fundamental legal principle of "ignorance of the law is no defense", we will accept no excuses for not having read and knowing the content of documents provided to you by our practice.  Pharmacy of record:  Definition: This is the pharmacy where your electronic prescriptions will be sent.  We do not endorse any particular pharmacy. It is up to you and your insurance to decide what pharmacy to use.  We do not restrict you in your choice of pharmacy. However, once we write for your prescriptions, we will NOT be re-sending more prescriptions to fix restricted supply problems created by your pharmacy, or your insurance.  The pharmacy listed in the electronic medical record should be the one where you want electronic prescriptions to be sent. If you choose to change pharmacy, simply notify our nursing staff. Changes will be made only during your regular appointments and not over the phone.  Recommendations: Keep all of your pain medications in a safe place, under lock and key, even if you live alone. We will NOT replace lost, stolen, or  damaged medication. We do not accept "Police Reports" as proof of medications having been stolen. After you fill your prescription, take 1 week's worth of pills and put them away in a safe place. You should keep a separate, properly labeled bottle for this purpose. The remainder should be kept in the original bottle. Use this as your primary supply, until it runs out. Once it's gone, then you know that you have 1 week's worth of medicine, and it is time to come in for a prescription refill. If you do this correctly, it is unlikely that you will ever run out of medicine. To make sure that the above recommendation works, it is very important that you make sure your medication refill appointments are scheduled at least 1 week before you run out of medicine. To do this in an effective manner, make sure that you do not leave the office without scheduling your next medication management appointment. Always ask the nursing staff to show you in your prescription , when your medication will be running out. Then arrange for the receptionist to get you a return appointment, at least 7 days before you run out of medicine. Do not wait until you have 1 or 2 pills left, to come in. This is very poor planning and does not take into consideration that we may need to cancel appointments due to bad weather, sickness, or emergencies affecting our staff. DO NOT ACCEPT A "Partial Fill": If for any reason your pharmacy does not have enough pills/tablets to completely fill or refill your prescription, do not allow for a "partial fill". The law allows the pharmacy to complete that prescription within 72 hours, without requiring a new prescription. If they do not fill the rest of your prescription within those 72 hours, you will need a separate prescription to fill the remaining amount, which we will NOT provide. If the reason for the partial fill is your insurance, you will need to talk to the pharmacist about payment alternatives for  the remaining tablets, but again, DO NOT ACCEPT A PARTIAL FILL, unless you can trust your pharmacist to obtain   the remainder of the pills within 72 hours.  Prescription refills and/or changes in medication(s):  Prescription refills, and/or changes in dose or medication, will be conducted only during scheduled medication management appointments. (Applies to both, written and electronic prescriptions.) No refills on procedure days. No medication will be changed or started on procedure days. No changes, adjustments, and/or refills will be conducted on a procedure day. Doing so will interfere with the diagnostic portion of the procedure. No phone refills. No medications will be "called into the pharmacy". No Fax refills. No weekend refills. No Holliday refills. No after hours refills.  Remember:  Business hours are:  Monday to Thursday 8:00 AM to 4:00 PM Provider's Schedule: Doyce Stonehouse, MD - Appointments are:  Medication management: Monday and Wednesday 8:00 AM to 4:00 PM Procedure day: Tuesday and Thursday 7:30 AM to 4:00 PM Bilal Lateef, MD - Appointments are:  Medication management: Tuesday and Thursday 8:00 AM to 4:00 PM Procedure day: Monday and Wednesday 7:30 AM to 4:00 PM (Last update: 01/13/2022) ______________________________________________________________________  ____________________________________________________________________________________________  Pharmacy Shortages of Pain Medication   Introduction Shockingly as it may seem, .  "No U.S[place] Supreme Court decision has ever interpreted the Constitution as guaranteeing a right to health care for all Americans." - https://www.healthequityandpolicylab.com/elusive-right-to-health-care-under-us-law  "With respect to human rights, the [place] has no formally codified right to health, nor does it participate in a human rights treaty that specifies a right to health." - Scott J. Schweikart, JD, MBE  Situation By  now, most of our patients have had the experience of being told by their pharmacist that they do not have enough medication to cover their prescription. If you have not had this experience, just know that you soon will.  Problem There appears to be a shortage of these medications, either at the national level or locally. This is happening with all pharmacies. When there is not enough medication, patients are offered a partial fill and they are told that they will try to get the rest of the medicine for them at a later time. If they do not have enough for even a partial fill, the pharmacists are telling the patients to call us (the prescribing physicians) to request that we send another prescription to another pharmacy to get the medicine.   This reordering of a controlled substance creates documentation problems where additional paperwork needs to be created to explain why two prescriptions for the same period of time and the same medicine are being prescribed to the same patient. It also creates situations where the last appointment note does not accurately reflect when and what prescriptions were given to a patient. This leads to prescribing errors down the line, in subsequent follow-up visits.   [place] Board of Pharmacy ([place]BOP) Research revealed that Board of Pharmacy Rule .1806 (21 NCAC 46.1806) authorizes pharmacists to the transfer of prescriptions among pharmacies, and it sets forth procedural and recordkeeping requirements for doing so. However, this requires the pharmacist to complete the previously mentioned procedural paperwork to accomplish the transfer. As it turns out, it is much easier for them to have the prescribing physicians do the work.   Possible solutions 1. You can ask your physician to assist you in weaning yourself off these medications. 2. Ask your pharmacy if the medication is in stock, 3 days prior to your refill. 3. If you need a pharmacy change, let us know at your  medication management visit. Prescriptions that have already been electronically sent to a pharmacy will   not be re-sent to a different pharmacy if your pharmacy of record does not have it in stock. Proper stocking of medication is a pharmacy problem, not a prescriber problem. Work with your pharmacist to solve the problem. 4. Have the [place] State Assembly add a provision to the "STOP ACT" (the law that mandates how controlled substances are prescribed) where there is an exception to the electronic prescribing rule that states that in the event there are shortages of medications the physicians are allowed to use written prescriptions as opposed to electronic ones. This would allow patients to take their prescriptions to a different pharmacy that may have enough medication available to fill the prescription. The problem is that currently there is a law that does not allow for written prescriptions, with the exception of instances where the electronic medical record is down due to technical issues.  5. Have [place] Congress ease the pressure on pharmaceutical companies, allowing them to produce enough quantities of the medication to adequately supply the population. 6. Have pharmacies keep enough stocks of these medications to cover their client base.  7. Have the [place] State Assembly add a provision to the "STOP ACT" where they ease the regulations surrounding the transfer of controlled substances between pharmacies, so as to simplify the transfer of supplies. As an alternative, develop a system to allow patients to obtain the remainder of their prescription at another one of their pharmacies or at an associate pharmacy.   How this shortage will affect you.  Understand that this is a pharmacy supply problem, not a prescriber problem. Work with your pharmacy to solve it. The job of the prescriber is to evaluate and monitor the patient for the appropriate indications and use of these medicines. It is  not the job of the prescriber to supply the medication or to solve problems with that supply. The responsibility and the choice to obtain the medication resides on the patient. By law, supplying the medication is the job of the pharmacy. It is certainly not the job of the prescriber to solve supply problems.   Due to the above problems we are no longer taking patients to write for their pain medication. Future discussions with your physician may include potentially weaning medications or transitioning to alternatives.  We will be focusing primarily on interventional based pain management. We will continue to evaluate for appropriate indications and we may provide recommendations regarding medication, dose, and schedule, as well as monitoring recommendations, however, we will not be taking over the actual prescribing of these substances. On those patients where we are treating their chronic pain with interventional therapies, exceptions will be considered on a case by case basis. At this time, we will try to continue providing this supplemental service to those patients we have been managing in the past. However, as of August 1st, 2023, we no longer will be sending additional prescriptions to other pharmacies for the purpose of solving their supply problems. Once we send a prescription to a pharmacy, we will not be resending it again to another pharmacy to cover for their shortages.   What to do. Write as many letters as you can. Recruit the help of family members in writing these letters. Below are some of the places where you can write to make your voice heard. Let them know what the problem is and push them to look for solutions.   Search internet for: "[place] find your legislators" https://www.ncleg.gov/findyourlegislators  Search internet for: "[place] insurance commissioner   complaints" https://www.ncdoi.gov/contactscomplaints/assistance-or-file-complaint  Search internet for: "[place] Board of Pharmacy complaints" http://www.ncbop.org/contact.htm  Search internet for: "[facility] pharmacy complaints" Email [facility] Customer Relations https://www.[facility].com/help/email-[email or web address].jsp?callType=store  Search internet for: "[facility] pharmacy customer service complaints" https://www.[facility].com/topic/marketing/contactus/contactus_customerservice.jsp  ____________________________________________________________________________________________  ____________________________________________________________________________________________  Drug Holidays ([place])  What is a "Drug Holiday"? Drug Holiday: is the name given to the period of time during which a patient stops taking a medication(s) for the purpose of eliminating tolerance to the drug.  Benefits Improved effectiveness of opioids. Decreased opioid dose needed to achieve benefits. Improved pain with lesser dose.  What is tolerance? Tolerance: is the progressive decreased in effectiveness of a drug due to its repetitive use. With repetitive use, the body gets use to the medication and as a consequence, it loses its effectiveness. This is a common problem seen with opioid pain medications. As a result, a larger dose of the drug is needed to achieve the same effect that used to be obtained with a smaller dose.  How long should a "Drug Holiday" last? You should stay off of the pain medicine for at least 14 consecutive days. (2 weeks)  Should I stop the medicine "cold turkey"? No. You should always coordinate with your Pain Specialist so that he/she can provide you with the correct medication dose to make the transition as smoothly as possible.  How do I stop the medicine? Slowly. You will be instructed to decrease the daily amount of pills that you take by one (1) pill every seven (7) days. This is called a "slow downward taper" of your dose. For example: if you normally take four (4) pills per day, you will  be asked to drop this dose to three (3) pills per day for seven (7) days, then to two (2) pills per day for seven (7) days, then to one (1) per day for seven (7) days, and at the end of those last seven (7) days, this is when the "Drug Holiday" would start.   Will I have withdrawals? By doing a "slow downward taper" like this one, it is unlikely that you will experience any significant withdrawal symptoms. Typically, what triggers withdrawals is the sudden stop of a high dose opioid therapy. Withdrawals can usually be avoided by slowly decreasing the dose over a prolonged period of time. If you do not follow these instructions and decide to stop your medication abruptly, withdrawals may be possible.  What are withdrawals? Withdrawals: refers to the wide range of symptoms that occur after stopping or dramatically reducing opiate drugs after heavy and prolonged use. Withdrawal symptoms do not occur to patients that use low dose opioids, or those who take the medication sporadically. Contrary to benzodiazepine (example: Valium, Xanax, etc.) or alcohol withdrawals ("Delirium Tremens"), opioid withdrawals are not lethal. Withdrawals are the physical manifestation of the body getting rid of the excess receptors.  Expected Symptoms Early symptoms of withdrawal may include: Agitation Anxiety Muscle aches Increased tearing Insomnia Runny nose Sweating Yawning  Late symptoms of withdrawal may include: Abdominal cramping Diarrhea Dilated pupils Goose bumps Nausea Vomiting  Will I experience withdrawals? Due to the slow nature of the taper, it is very unlikely that you will experience any.  What is a slow taper? Taper: refers to the gradual decrease in dose.  (Last update: 10/11/2019) ____________________________________________________________________________________________   ____________________________________________________________________________________________  CBD (cannabidiol) &  Delta-8 (Delta-8 tetrahydrocannabinol) WARNING  Intro: Cannabidiol (CBD) and tetrahydrocannabinol (THC), are two natural compounds found in plants of the Cannabis genus. They can both be extracted   from hemp or cannabis. Hemp and cannabis come from the Cannabis sativa plant. Both compounds interact with your body's endocannabinoid system, but they have very different effects. CBD does not produce the high sensation associated with cannabis. Delta-8 tetrahydrocannabinol, also known as delta-8 THC, is a psychoactive substance found in the Cannabis sativa plant, of which marijuana and hemp are two varieties. THC is responsible for the high associated with the illicit use of marijuana.  Applicable to: All individuals currently taking or considering taking CBD (cannabidiol) and, more important, all patients taking opioid analgesic controlled substances (pain medication). (Example: oxycodone; oxymorphone; hydrocodone; hydromorphone; morphine; methadone; tramadol; tapentadol; fentanyl; buprenorphine; butorphanol; dextromethorphan; meperidine; codeine; etc.)  Legal status: CBD remains a Schedule I drug prohibited for any use. CBD is illegal with one exception. In the [place], CBD has a limited Food and Drug Administration (FDA) approval for the treatment of two specific types of epilepsy disorders. Only one CBD product has been approved by the FDA for this purpose: "Epidiolex". FDA is aware that some companies are marketing products containing cannabis and cannabis-derived compounds in ways that violate the Federal Food, Drug and Cosmetic Act (FD&C Act) and that may put the health and safety of consumers at risk. The FDA, a Federal agency, has not enforced the CBD status since 2018. UPDATE: (05/09/2021) The Drug Enforcement Agency (DEA) issued a letter stating that "delta" cannabinoids, including Delta-8-THCO and Delta-9-THCO, synthetically derived from hemp do not qualify as hemp and will be viewed as Schedule I  drugs. (Schedule I drugs, substances, or chemicals are defined as drugs with no currently accepted medical use and a high potential for abuse. Some examples of Schedule I drugs are: heroin, lysergic acid diethylamide (LSD), marijuana (cannabis), 3,4-methylenedioxymethamphetamine (ecstasy), methaqualone, and peyote.) (https://www.dea.gov)  Legality: Some manufacturers ship CBD products nationally, which is illegal. Often such products are sold online and are therefore available throughout the country. CBD is openly sold in head shops and health food stores in some states where such sales have not been explicitly legalized. Selling unapproved products with unsubstantiated therapeutic claims is not only a violation of the law, but also can put patients at risk, as these products have not been proven to be safe or effective. Federal illegality makes it difficult to conduct research on CBD.  Reference: "FDA Regulation of Cannabis and Cannabis-Derived Products, Including Cannabidiol (CBD)" - https://www.fda.gov/news-events/public-health-focus/fda-regulation-cannabis-and-cannabis-derived-products-including-cannabidiol-cbd  Warning: CBD is not FDA approved and has not undergo the same manufacturing controls as prescription drugs.  This means that the purity and safety of available CBD may be questionable. Most of the time, despite manufacturer's claims, it is contaminated with THC (delta-9-tetrahydrocannabinol - the chemical in marijuana responsible for the "HIGH").  When this is the case, the THC contaminant will trigger a positive urine drug screen (UDS) test for Marijuana (carboxy-THC). Because a positive UDS for any illicit substance is a violation of our medication agreement, your opioid analgesics (pain medicine) may be permanently discontinued. The FDA recently put out a warning about 5 things that everyone should be aware of regarding Delta-8 THC: Delta-8 THC products have not been evaluated or approved by  the FDA for safe use and may be marketed in ways that put the public health at risk. The FDA has received adverse event reports involving delta-8 THC-containing products. Delta-8 THC has psychoactive and intoxicating effects. Delta-8 THC manufacturing often involve use of potentially harmful chemicals to create the concentrations of delta-8 THC claimed in the marketplace. The final delta-8 THC product may have potentially   harmful by-products (contaminants) due to the chemicals used in the process. Manufacturing of delta-8 THC products may occur in uncontrolled or unsanitary settings, which may lead to the presence of unsafe contaminants or other potentially harmful substances. Delta-8 THC products should be kept out of the reach of children and pets.  MORE ABOUT CBD  General Information: CBD was discovered in 1940 and it is a derivative of the cannabis sativa genus plants (Marijuana and Hemp). It is one of the 113 identified substances found in Marijuana. It accounts for up to 40% of the plant's extract. As of 2018, preliminary clinical studies on CBD included research for the treatment of anxiety, movement disorders, and pain. CBD is available and consumed in multiple forms, including inhalation of smoke or vapor, as an aerosol spray, and by mouth. It may be supplied as an oil containing CBD, capsules, dried cannabis, or as a liquid solution. CBD is thought not to be as psychoactive as THC (delta-9-tetrahydrocannabinol - the chemical in marijuana responsible for the "HIGH"). Studies suggest that CBD may interact with different biological target receptors in the body, including cannabinoid and other neurotransmitter receptors. As of 2018 the mechanism of action for its biological effects has not been determined.  Side-effects  Adverse reactions: Dry mouth, diarrhea, decreased appetite, fatigue, drowsiness, malaise, weakness, sleep disturbances, and others.  Drug interactions: CBC may interact with other  medications such as blood-thinners. Because CBD causes drowsiness on its own, it also increases the drowsiness caused by other medications, including antihistamines (such as Benadryl), benzodiazepines (Xanax, Ativan, Valium), antipsychotics, antidepressants and opioids, as well as alcohol and supplements such as kava, melatonin and St. John's Wort. Be cautious with the following combinations:   Brivaracetam (Briviact) Brivaracetam is changed and broken down by the body. CBD might decrease how quickly the body breaks down brivaracetam. This might increase levels of brivaracetam in the body.  Caffeine Caffeine is changed and broken down by the body. CBD might decrease how quickly the body breaks down caffeine. This might increase levels of caffeine in the body.  Carbamazepine (Tegretol) Carbamazepine is changed and broken down by the body. CBD might decrease how quickly the body breaks down carbamazepine. This might increase levels of carbamazepine in the body and increase its side effects.  Citalopram (Celexa) Citalopram is changed and broken down by the body. CBD might decrease how quickly the body breaks down citalopram. This might increase levels of citalopram in the body and increase its side effects.  Clobazam (Onfi) Clobazam is changed and broken down by the liver. CBD might decrease how quickly the liver breaks down clobazam. This might increase the effects and side effects of clobazam.  Eslicarbazepine (Aptiom) Eslicarbazepine is changed and broken down by the body. CBD might decrease how quickly the body breaks down eslicarbazepine. This might increase levels of eslicarbazepine in the body by a small amount.  Everolimus (Zostress) Everolimus is changed and broken down by the body. CBD might decrease how quickly the body breaks down everolimus. This might increase levels of everolimus in the body.  Lithium Taking higher doses of CBD might increase levels of lithium. This can increase  the risk of lithium toxicity.  Medications changed by the liver (Cytochrome P450 1A1 (CYP1A1) substrates) Some medications are changed and broken down by the liver. CBD might change how quickly the liver breaks down these medications. This could change the effects and side effects of these medications.  Medications changed by the liver (Cytochrome P450 1A2 (CYP1A2) substrates) Some medications are   changed and broken down by the liver. CBD might change how quickly the liver breaks down these medications. This could change the effects and side effects of these medications.  Medications changed by the liver (Cytochrome P450 1B1 (CYP1B1) substrates) Some medications are changed and broken down by the liver. CBD might change how quickly the liver breaks down these medications. This could change the effects and side effects of these medications.  Medications changed by the liver (Cytochrome P450 2A6 (CYP2A6) substrates) Some medications are changed and broken down by the liver. CBD might change how quickly the liver breaks down these medications. This could change the effects and side effects of these medications.  Medications changed by the liver (Cytochrome P450 2B6 (CYP2B6) substrates) Some medications are changed and broken down by the liver. CBD might change how quickly the liver breaks down these medications. This could change the effects and side effects of these medications.  Medications changed by the liver (Cytochrome P450 2C19 (CYP2C19) substrates) Some medications are changed and broken down by the liver. CBD might change how quickly the liver breaks down these medications. This could change the effects and side effects of these medications.  Medications changed by the liver (Cytochrome P450 2C8 (CYP2C8) substrates) Some medications are changed and broken down by the liver. CBD might change how quickly the liver breaks down these medications. This could change the effects and side effects  of these medications.  Medications changed by the liver (Cytochrome P450 2C9 (CYP2C9) substrates) Some medications are changed and broken down by the liver. CBD might change how quickly the liver breaks down these medications. This could change the effects and side effects of these medications.  Medications changed by the liver (Cytochrome P450 2D6 (CYP2D6) substrates) Some medications are changed and broken down by the liver. CBD might change how quickly the liver breaks down these medications. This could change the effects and side effects of these medications.  Medications changed by the liver (Cytochrome P450 2E1 (CYP2E1) substrates) Some medications are changed and broken down by the liver. CBD might change how quickly the liver breaks down these medications. This could change the effects and side effects of these medications.  Medications changed by the liver (Cytochrome P450 3A4 (CYP3A4) substrates) Some medications are changed and broken down by the liver. CBD might change how quickly the liver breaks down these medications. This could change the effects and side effects of these medications.  Medications changed by the liver (Glucuronidated drugs) Some medications are changed and broken down by the liver. CBD might change how quickly the liver breaks down these medications. This could change the effects and side effects of these medications.  Medications that decrease the breakdown of other medications by the liver (Cytochrome P450 2C19 (CYP2C19) inhibitors) CBD is changed and broken down by the liver. Some drugs decrease how quickly the liver changes and breaks down CBD. This could change the effects and side effects of CBD.  Medications that decrease the breakdown of other medications in the liver (Cytochrome P450 3A4 (CYP3A4) inhibitors) CBD is changed and broken down by the liver. Some drugs decrease how quickly the liver changes and breaks down CBD. This could change the effects  and side effects of CBD.  Medications that increase breakdown of other medications by the liver (Cytochrome P450 3A4 (CYP3A4) inducers) CBD is changed and broken down by the liver. Some drugs increase how quickly the liver changes and breaks down CBD. This could change the effects and side effects of   CBD.  Medications that increase the breakdown of other medications by the liver (Cytochrome P450 2C19 (CYP2C19) inducers) CBD is changed and broken down by the liver. Some drugs increase how quickly the liver changes and breaks down CBD. This could change the effects and side effects of CBD.  Methadone (Dolophine) Methadone is broken down by the liver. CBD might decrease how quickly the liver breaks down methadone. Taking cannabidiol along with methadone might increase the effects and side effects of methadone.  Rufinamide (Banzel) Rufinamide is changed and broken down by the body. CBD might decrease how quickly the body breaks down rufinamide. This might increase levels of rufinamide in the body by a small amount.  Sedative medications (CNS depressants) CBD might cause sleepiness and slowed breathing. Some medications, called sedatives, can also cause sleepiness and slowed breathing. Taking CBD with sedative medications might cause breathing problems and/or too much sleepiness.  Sirolimus (Rapamune) Sirolimus is changed and broken down by the body. CBD might decrease how quickly the body breaks down sirolimus. This might increase levels of sirolimus in the body.  Stiripentol (Diacomit) Stiripentol is changed and broken down by the body. CBD might decrease how quickly the body breaks down stiripentol. This might increase levels of stiripentol in the body and increase its side effects.  Tacrolimus (Prograf) Tacrolimus is changed and broken down by the body. CBD might decrease how quickly the body breaks down tacrolimus. This might increase levels of tacrolimus in the body.  Tamoxifen  (Soltamox) Tamoxifen is changed and broken down by the body. CBD might affect how quickly the body breaks down tamoxifen. This might affect levels of tamoxifen in the body.  Topiramate (Topamax) Topiramate is changed and broken down by the body. CBD might decrease how quickly the body breaks down topiramate. This might increase levels of topiramate in the body by a small amount.  Valproate Valproic acid can cause liver injury. Taking cannabidiol with valproic acid might increase the chance of liver injury. CBD and/or valproic acid might need to be stopped, or the dose might need to be reduced.  Warfarin (Coumadin) CBD might increase levels of warfarin, which can increase the risk for bleeding. CBD and/or warfarin might need to be stopped, or the dose might need to be reduced.  Zonisamide Zonisamide is changed and broken down by the body. CBD might decrease how quickly the body breaks down zonisamide. This might increase levels of zonisamide in the body by a small amount. (Last update: 05/21/2021) ____________________________________________________________________________________________  Naloxone Nasal Spray What is this medication? NALOXONE (nal OX one) treats opioid overdose, which causes slow or shallow breathing, severe drowsiness, or trouble staying awake. Call emergency services after using this medication. You may need additional treatment. Naloxone works by reversing the effects of opioids. It belongs to a group of medications called opioid blockers. This medicine may be used for other purposes; ask your health care provider or pharmacist if you have questions. COMMON BRAND NAME(S): Kloxxado, Narcan What should I tell my care team before I take this medication? They need to know if you have any of these conditions: Heart disease Substance use disorder An unusual or allergic reaction to naloxone, other medications, foods, dyes, or preservatives Pregnant or trying to get  pregnant Breast-feeding How should I use this medication? This medication is for use in the nose. Lay the person on their back. Support their neck with your hand and allow the head to tilt back before giving the medication. The nasal spray should be given into   1 nostril. After giving the medication, move the person onto their side. Do not remove or test the nasal spray until ready to use. Get emergency medical help right away after giving the first dose of this medication, even if the person wakes up. You should be familiar with how to recognize the signs and symptoms of a narcotic overdose. If more doses are needed, give the additional dose in the other nostril. Talk to your care team about the use of this medication in children. While this medication may be prescribed for children as young as newborns for selected conditions, precautions do apply. Overdosage: If you think you have taken too much of this medicine contact a poison control center or emergency room at once. NOTE: This medicine is only for you. Do not share this medicine with others. What if I miss a dose? This does not apply. What may interact with this medication? This is only used during an emergency. No interactions are expected during emergency use. This list may not describe all possible interactions. Give your health care provider a list of all the medicines, herbs, non-prescription drugs, or dietary supplements you use. Also tell them if you smoke, drink alcohol, or use illegal drugs. Some items may interact with your medicine. What should I watch for while using this medication? Keep this medication ready for use in the case of an opioid overdose. Make sure that you have the phone number of your care team and local hospital ready. You may need to have additional doses of this medication. Each nasal spray contains a single dose. Some emergencies may require additional doses. After use, bring the treated person to the nearest  hospital or call 911. Make sure the treating care team knows that the person has received a dose of this medication. You will receive additional instructions on what to do during and after use of this medication before an emergency occurs. What side effects may I notice from receiving this medication? Side effects that you should report to your care team as soon as possible: Allergic reactions--skin rash, itching, hives, swelling of the face, lips, tongue, or throat Side effects that usually do not require medical attention (report these to your care team if they continue or are bothersome): Constipation Dryness or irritation inside the nose Headache Increase in blood pressure Muscle spasms Stuffy nose Toothache This list may not describe all possible side effects. Call your doctor for medical advice about side effects. You may report side effects to FDA at 1-800-FDA-1088. Where should I keep my medication? Keep out of the reach of children and pets. Store between 20 and 25 degrees C (68 and 77 degrees F). Do not freeze. Throw away any unused medication after the expiration date. Keep in original box until ready to use. NOTE: This sheet is a summary. It may not cover all possible information. If you have questions about this medicine, talk to your doctor, pharmacist, or health care provider.  2023 [employer]/Gold Standard (2020-11-15 00:00:00)  

## 2022-02-01 NOTE — Progress Notes (Unsigned)
PROVIDER NOTE: Information contained herein reflects review and annotations entered in association with encounter. Interpretation of such information and data should be left to medically-trained personnel. Information provided to patient can be located elsewhere in the medical record under "Patient Instructions". Document created using STT-dictation technology, any transcriptional errors that may result from process are unintentional.    Patient: [name]  Service Category: E/M  Provider: Gaspar Cola, MD  DOB: 03-22-1951  DOS: 02/02/2022  Referring Provider: Carollee Leitz, MD  MRN: [id number]  Specialty: Interventional Pain Management  PCP: Carollee Leitz, MD  Type: Established Patient  Setting: Ambulatory outpatient    Location: Office  Delivery: Face-to-face     HPI  [name], a 71 y.o. year old adult, is here today because of her Chronic pain syndrome [G89.4]. Ms. Fantini primary complain today is No chief complaint on file. Last encounter: My last encounter with her was on 01/21/2022. Pertinent problems: Ms. Kochel has Diabetic peripheral neuropathy associated with type 2 diabetes mellitus ([place]); Other shoulder lesions (Right); Neuropathy of lateral femoral cutaneous nerve (Right); Arthralgia; Osteoarthritis involving multiple joints; Status post reverse total shoulder replacement; Chronic pain syndrome; Chronic shoulder pain (Right); Carpal tunnel syndrome; Cervical radiculitis; Cubital tunnel syndrome (Right); Impingement syndrome of shoulder region; Pain in joint involving ankle and foot; Spinal stenosis of thoracic region; Hip joint pain (Left); Abnormality of gait and mobility; Diabetic ulcer of left foot ([place]); Muscle strain of hip (Left); Hip strain (Right); Ischial pain, right; Fibromyalgia syndrome; Chronic musculoskeletal pain; Peripheral vascular disease ([place]); Chronic ulcer of right leg ([place]); Meralgia paresthetica (Right); Chronic pain of right ankle; Arthritis;  Left ankle pain; Chronic shoulder pain (Left); Left hemiparesis ([place]); Primary osteoarthritis of left shoulder; Rotator cuff disorder, left; Ulcer of left heel and midfoot (HCC); PAD (peripheral artery disease) ([place]); Peripheral neurogenic pain; Chronic peripheral neuropathic pain; Chronic shoulder pain (Bilateral); Impaired range of motion of shoulder (Left); Acute pain of shoulder (Left); and Abnormal MRI, shoulder on their pertinent problem list. Pain Assessment: Severity of   is reported as a  /10. Location:    / . Onset:  . Quality:  . Timing:  . Modifying factor(s):  Marland Kitchen Vitals:  vitals were not taken for this visit.   Reason for encounter: medication management. ***  05/03/2022   Pharmacotherapy Assessment  Analgesic: Hydrocodone/APAP 5/325 mg, 1 tab PO QD PRN (5 mg/day of hydrocodone) MME/day: 5 mg/day.   Monitoring: [place] PMP: PDMP reviewed during this encounter.       Pharmacotherapy: No side-effects or adverse reactions reported. Compliance: No problems identified. Effectiveness: Clinically acceptable.  No notes on file  No results found for: "CBDTHCR" No results found for: "D8THCCBX" No results found for: "D9THCCBX"  UDS:  Summary  Date Value Ref Range Status  10/29/2021 Note  Final    Comment:    ==================================================================== [employer] [facility] (MW) ==================================================================== Test                             Result       Flag       Units  Drug Present and Declared for Prescription Verification   Alprazolam                     45           EXPECTED   ng/mg creat   Alpha-hydroxyalprazolam        62  EXPECTED   ng/mg creat    Source of alprazolam is a scheduled prescription medication. Alpha-    hydroxyalprazolam is an expected metabolite of alprazolam.    Hydrocodone                    652          EXPECTED   ng/mg creat   Dihydrocodeine                 232          EXPECTED   ng/mg  creat   Norhydrocodone                 537          EXPECTED   ng/mg creat    Sources of hydrocodone include scheduled prescription medications.    Dihydrocodeine and norhydrocodone are expected metabolites of    hydrocodone. Dihydrocodeine is also available as a scheduled    prescription medication.  ==================================================================== Test                      Result    Flag   Units      Ref Range   Creatinine              65               mg/dL      >=20 ==================================================================== Declared Medications:  The flagging and interpretation on this report are based on the  following declared medications.  Unexpected results may arise from  inaccuracies in the declared medications.   **Note: The testing scope of this panel includes these medications:   Alprazolam (Xanax)  Hydrocodone (Norco)   **Note: The testing scope of this panel does not include the  following reported medications:   Acetaminophen (Tylenol)  Acetaminophen (Norco)  Albuterol (Proventil HFA)  Apixaban (Eliquis)  Benzonatate (Tessalon)  Bupropion (Wellbutrin XL)  Calcium  Cholecalciferol  Clobetasol (Temovate)  Colchicine  Duloxetine (Cymbalta)  Estradiol (Estrace)  Fluconazole (Diflucan)  Fluticasone (Trelegy)  Folic Acid  Furosemide (Lasix)  Levocetirizine (Xyzal)  Levothyroxine (Synthroid)  Losartan (Cozaar)  Metformin (Glucophage)  Metoprolol (Toprol)  Montelukast (Singulair)  Multivitamin  Mupirocin (Bactroban)  Nystatin (Mycostatin)  Ondansetron (Zofran)  Pantoprazole (Protonix)  Pregabalin (Lyrica)  Rosuvastatin (Crestor)  Sitagliptin (Januvia)  Sucralfate (Carafate)  Supplement  Topical  Topical Diclofenac (Voltaren)  Umeclidinium (Trelegy)  Vibegron (Gemtesa)  Vilanterol (Trelegy)  Vitamin B1  Vitamin D  Vitamin D2 ==================================================================== For clinical  consultation, please call [phone]. ====================================================================       ROS  Constitutional: Denies any fever or chills Gastrointestinal: No reported hemesis, hematochezia, vomiting, or acute GI distress Musculoskeletal: Denies any acute onset joint swelling, redness, loss of ROM, or weakness Neurological: No reported episodes of acute onset apraxia, aphasia, dysarthria, agnosia, amnesia, paralysis, loss of coordination, or loss of consciousness  Medication Review  ALPRAZolam, DULoxetine, Fluticasone-Umeclidin-Vilant, [place] No Mess Body Powder, HYDROcodone-acetaminophen, Semaglutide (1 MG/DOSE), Semaglutide (2 MG/DOSE), Semaglutide(0.25 or 0.5MG/DOS), Tub Transfer Board, Vibegron, acetaminophen, albuterol, apixaban, buPROPion, calcium-vitamin D, cephALEXin, clobetasol cream, colchicine, diclofenac Sodium, ergocalciferol, estradiol, folic acid, furosemide, glucose blood, levocetirizine, levothyroxine, losartan, metoprolol succinate, montelukast, multivitamin with minerals, mupirocin ointment, nystatin cream, ondansetron, onetouch ultrasoft, pantoprazole, pregabalin, rosuvastatin, sucralfate, thiamine, and vitamin D3  History Review  Allergy: Ms. Savary is allergic to morphine and related, penicillins, ambien [zolpidem], aspirin, and oxytetracycline. Drug: Ms. Olmo  reports no history of drug use. Alcohol:  reports no history of alcohol use. Tobacco:  reports that she has never smoked. She has never used smokeless tobacco. Social: Ms. Ibsen  reports that she has never smoked. She has never used smokeless tobacco. She reports that she does not drink alcohol and does not use drugs. Medical:  has a past medical history of Abnormal antibody titer, Anxiety and depression, Arthritis, Asthma, Atypical chest pain, Carotid arterial disease ([place]), Chronic heart failure with preserved ejection fraction (HFpEF) ([place]), CKD (chronic kidney disease), stage  III ([place]), Cystocele, Depression, Diabetes ([place]), DVT (deep venous thrombosis) ([place]), Dyspnea, Edema, GERD (gastroesophageal reflux disease), Heart murmur, History of IBS, History of kidney stones, History of methicillin resistant staphylococcus aureus (MRSA) (2008), History of multiple strokes, Hyperlipidemia, Hypertension, Hypothyroidism, Insomnia, Kidney stone, Mitral regurgitation, Neuropathy involving both lower extremities, Non-healing ulcer of left foot ([place]), Obesity, Class II, BMI 35-39.9, with comorbidity, PAF (paroxysmal atrial fibrillation) ([place]), PAH (pulmonary artery hypertension) ([place]), Peripheral vascular disease ([place]), Sleep apnea, Stroke ([place]), Tricuspid regurgitation, Urinary incontinence, and Venous stasis dermatitis of both lower extremities. Surgical: Ms. Dixson  has a past surgical history that includes Ankle surgery (Right); Vaginal hysterectomy; Sleeve Gastroplasty; Lithotripsy; Kidney surgery (Right); transthoracic echocardiogram (08/03/2013); transesophageal echocardiogram (08/08/2013); Tonsillectomy; Total knee arthroplasty (Left); Hiatal hernia repair; Cholecystectomy; I & D extremity (Left, 06/27/2015); Application if wound vac (Left, 06/27/2015); removal of left hematoma (Left); NM GATED MYOVIEW [facility] HX) (February 2017); Reverse shoulder arthroplasty (Right, 10/08/2015); Hernia repair; Ulnar nerve transposition (Right, 08/06/2016); arm surgery; Irrigation and debridement abscess (Left, 09/07/2016); Incision and drainage of wound (Left, 09/25/2016); Application if wound vac (Left, 09/25/2016); Colonoscopy; Upper gi endoscopy; Esophagogastroduodenoscopy (egd) with propofol (N/A, 01/11/2017); Colonoscopy with propofol (N/A, 01/11/2017); Colonoscopy with propofol (N/A, 02/22/2017); OTHER SURGICAL HISTORY; Total shoulder replacement (Right); Joint replacement (2013); Colonoscopy with propofol (N/A, 05/31/2017); Lower Extremity Angiography (Left, 04/04/2018); Esophagogastroduodenoscopy (egd) with  propofol (N/A, 10/25/2018); Incision and drainage of wound (Left, 11/09/2018); Esophagogastroduodenoscopy (egd) with propofol (N/A, 11/29/2019); Esophagogastroduodenoscopy (egd) with propofol (N/A, 01/26/2022); and Colonoscopy with propofol (N/A, 01/26/2022). Family: family history includes Alcohol abuse in her father; Arthritis in her maternal grandfather, maternal grandmother, mother, paternal grandfather, and paternal grandmother; Breast cancer in her maternal aunt; Cancer in her father and maternal aunt; Cerebral aneurysm in her father; Diabetes in her father, maternal grandmother, and paternal grandmother; Heart disease in her maternal grandfather; Hypertension in her father, maternal grandfather, maternal grandmother, paternal grandfather, and paternal grandmother; Kidney disease in her mother; Mental illness in her sister; Peripheral vascular disease in her father; Skin cancer in her father; Varicose Veins in her mother.  Laboratory Chemistry Profile   Renal Lab Results  Component Value Date   BUN 27 (H) 11/27/2021   CREATININE 1.30 (H) 11/27/2021   LABCREA 48 08/22/2020   BCR 27 06/20/2021   GFR 41.16 (L) 11/27/2021   GFRAA 58 (L) 12/25/2019   GFRNONAA 46 (L) 07/30/2021    Hepatic Lab Results  Component Value Date   AST 16 11/27/2021   ALT 15 11/27/2021   ALBUMIN 4.2 11/27/2021   ALKPHOS 80 11/27/2021   LIPASE 27 07/28/2018   AMMONIA <9 (L) 02/07/2020    Electrolytes Lab Results  Component Value Date   NA 139 11/27/2021   K 5.0 11/27/2021   CL 101 11/27/2021   CALCIUM 9.5 11/27/2021   MG 1.9 06/08/2021   PHOS 4.0 06/08/2021    Bone Lab Results  Component Value Date   VD25OH 54.75 11/27/2021    Inflammation (CRP: Acute Phase) (ESR: Chronic Phase)  Lab Results  Component Value Date   CRP 3.1 (H) 04/08/2020   ESRSEDRATE 60 (H) 04/08/2020   LATICACIDVEN 1.1 12/20/2019         Note: Above Lab results reviewed.  Recent Imaging Review  CT ABDOMEN PELVIS WO  CONTRAST CLINICAL DATA:  Abdominal pain, acute, nonlocalized  EXAM: CT ABDOMEN AND PELVIS WITHOUT CONTRAST  TECHNIQUE: Multidetector CT imaging of the abdomen and pelvis was performed following the standard protocol without IV contrast.  RADIATION DOSE REDUCTION: This exam was performed according to the departmental dose-optimization program which includes automated exposure control, adjustment of the mA and/or kV according to patient size and/or use of iterative reconstruction technique.  COMPARISON:  02/07/2020, 08/12/2017  FINDINGS: Lower chest: Mild right basilar atelectasis. Cardiac size within normal limits. Central pulmonary arteries are enlarged in keeping with changes of pulmonary arterial hypertension. Small hiatal hernia.  Hepatobiliary: No focal liver abnormality is seen. Status post cholecystectomy. No biliary dilatation.  Pancreas: Unremarkable  Spleen: Unremarkable  Adrenals/Urinary Tract: Bilateral macroscopic fat containing adrenal masses are again identified in keeping with bilateral adrenal myelolipoma. Since remote prior examination, these demonstrate interval increase in size, now measuring 9.4 cm on the left and 9.3 cm in greatest dimension on the right. The kidneys are normal in size and position. No hydronephrosis. Nonobstructing 6 mm calculus within the left renal pelvis. No additional renal or ureteral calculi. Bladder unremarkable.  Stomach/Bowel: Surgical changes of gastric sleeve resection are identified. The stomach, small bowel, and large bowel are otherwise unremarkable. Appendix normal. No free intraperitoneal gas or fluid.  Vascular/Lymphatic: No significant vascular findings are present. No enlarged abdominal or pelvic lymph nodes.  Reproductive: Status post hysterectomy. No adnexal masses.  Other: Moderate subcutaneous infiltration is seen within the pannus and flanks bilaterally, most commonly reflecting subcutaneous edema. No  abdominal wall hernia.  Musculoskeletal: No acute bone abnormality. Degenerative changes are seen within the thoracolumbar spine. Stable T12 mild compression deformity.  IMPRESSION: No acute intra-abdominal pathology identified. No definite radiographic explanation for the patient's reported symptoms.  Bilateral benign adrenal myelolipomas again identified demonstrating mild interval increase in size since prior examination.  Status post gastric sleeve resection.  Small hiatal hernia.  Electronically Signed   By: Fidela Salisbury M.D.   On: 06/06/2021 21:54 DG Chest 2 View CLINICAL DATA:  Shortness of breath.  EXAM: CHEST - 2 VIEW  COMPARISON:  04/11/2021  FINDINGS: Stable enlargement of the cardiopericardial silhouette. Asymmetric elevation of the right hemidiaphragm is unchanged. No pulmonary edema or pleural effusion. Insert no consolidation status post right shoulder replacement  IMPRESSION: Stable exam. No acute cardiopulmonary findings.  Electronically Signed   By: Misty Stanley M.D.   On: 06/06/2021 18:40 Note: Reviewed        Physical Exam  General appearance: Well nourished, well developed, and well hydrated. In no apparent acute distress Mental status: Alert, oriented x 3 (person, place, & time)       Respiratory: No evidence of acute respiratory distress Eyes: PERLA Vitals: There were no vitals taken for this visit. BMI: Estimated body mass index is 44.45 kg/m as calculated from the following:   Height as of 01/29/22: _0  (1.676 m).   Weight as of 01/29/22: 275 lb 6.4 oz (124.9 kg). Ideal: Ideal body weight: 59.3 kg (130 lb 11.7 oz) Adjusted ideal body weight: 85.5 kg (188 lb 9.6 oz)  Assessment   Diagnosis Status  1. Chronic pain syndrome   2. Osteoarthritis involving multiple joints   3.  Meralgia paresthetica (Right)   4. Chronic shoulder pain (Bilateral)   5. Fibromyalgia syndrome   6. Pharmacologic therapy   7. Chronic use of opiate for  therapeutic purpose   8. Encounter for medication management   9. Encounter for chronic pain management   10. Left hemiparesis ([place])    Controlled Controlled Controlled   Updated Problems: No problems updated.   Plan of Care  Problem-specific:  No problem-specific Assessment & Plan notes found for this encounter.  Ms. ASHLEY BULTEMA has a current medication list which includes the following long-term medication(s): albuterol, bupropion, calcium-vitamin d, colchicine, duloxetine, eliquis, furosemide, gemtesa, hydrocodone-acetaminophen, levocetirizine, levothyroxine, losartan, metoprolol succinate, montelukast, pantoprazole, pregabalin, and rosuvastatin.  Pharmacotherapy (Medications Ordered): No orders of the defined types were placed in this encounter.  Orders:  No orders of the defined types were placed in this encounter.  Follow-up plan:   No follow-ups on file.     Interventional Therapies  Risk  Complexity Considerations:   Estimated body mass index is 41.79 kg/m as calculated from the following:   Height as of this encounter: 5' 5.5" (1.664 m).   Weight as of this encounter: 255 lb (115.7 kg). NOTE: Eliquis ANTICOAGULATION (Stop: 3 days  Restart: 6 hours) NO LUMBAR RFA until BMI < 35.   Planned  Pending:      Under consideration:   Diagnostic left suprascapular NB   Completed:   None since before 2017   Completed by other providers:   Therapeutic left IA shoulder joint injection x1 (May 2023) by Dr. Thalia Party [facility])    Therapeutic  Palliative (PRN) options:   None at this time     Recent Visits No visits were found meeting these conditions. Showing recent visits within past 90 days and meeting all other requirements Future Appointments Date Type Provider Dept  02/02/22 Appointment Milinda Pointer, MD [facility]  Showing future appointments within next 90 days and meeting all other requirements  I discussed the  assessment and treatment plan with the patient. The patient was provided an opportunity to ask questions and all were answered. The patient agreed with the plan and demonstrated an understanding of the instructions.  Patient advised to call back or seek an in-person evaluation if the symptoms or condition worsens.  Duration of encounter: *** minutes.  Total time on encounter, as per AMA guidelines included both the face-to-face and non-face-to-face time personally spent by the physician and/or other qualified health care professional(s) on the day of the encounter (includes time in activities that require the physician or other qualified health care professional and does not include time in activities normally performed by clinical staff). Physician's time may include the following activities when performed: preparing to see the patient (eg, review of tests, pre-charting review of records) obtaining and/or reviewing separately obtained history performing a medically appropriate examination and/or evaluation counseling and educating the patient/family/caregiver ordering medications, tests, or procedures referring and communicating with other health care professionals (when not separately reported) documenting clinical information in the electronic or other health record independently interpreting results (not separately reported) and communicating results to the patient/ family/caregiver care coordination (not separately reported)  Note by: Gaspar Cola, MD Date: 02/02/2022; Time: 6:48 PM

## 2022-02-02 ENCOUNTER — Encounter: Payer: Self-pay | Admitting: Pain Medicine

## 2022-02-02 ENCOUNTER — Ambulatory Visit: Payer: Medicare HMO | Attending: Pain Medicine | Admitting: Pain Medicine

## 2022-02-02 VITALS — BP 133/90 | HR 87 | Temp 97.0°F | Resp 18 | Ht 66.0 in | Wt 263.0 lb

## 2022-02-02 DIAGNOSIS — Z79891 Long term (current) use of opiate analgesic: Secondary | ICD-10-CM | POA: Insufficient documentation

## 2022-02-02 DIAGNOSIS — G8929 Other chronic pain: Secondary | ICD-10-CM | POA: Diagnosis not present

## 2022-02-02 DIAGNOSIS — M797 Fibromyalgia: Secondary | ICD-10-CM | POA: Insufficient documentation

## 2022-02-02 DIAGNOSIS — M25511 Pain in right shoulder: Secondary | ICD-10-CM | POA: Insufficient documentation

## 2022-02-02 DIAGNOSIS — M159 Polyosteoarthritis, unspecified: Secondary | ICD-10-CM | POA: Insufficient documentation

## 2022-02-02 DIAGNOSIS — G894 Chronic pain syndrome: Secondary | ICD-10-CM | POA: Diagnosis not present

## 2022-02-02 DIAGNOSIS — G5711 Meralgia paresthetica, right lower limb: Secondary | ICD-10-CM | POA: Diagnosis not present

## 2022-02-02 DIAGNOSIS — Z79899 Other long term (current) drug therapy: Secondary | ICD-10-CM | POA: Diagnosis not present

## 2022-02-02 DIAGNOSIS — G8194 Hemiplegia, unspecified affecting left nondominant side: Secondary | ICD-10-CM | POA: Diagnosis not present

## 2022-02-02 DIAGNOSIS — M25512 Pain in left shoulder: Secondary | ICD-10-CM | POA: Diagnosis not present

## 2022-02-02 MED ORDER — NALOXONE HCL 4 MG/0.1ML NA LIQD: 1.0000 | NASAL | 0 refills | Status: DC | PRN

## 2022-02-02 MED ORDER — HYDROCODONE-ACETAMINOPHEN 5-325 MG PO TABS: 1.0000 | ORAL_TABLET | Freq: Two times a day (BID) | ORAL | 0 refills | Status: DC | PRN

## 2022-02-02 NOTE — Progress Notes (Signed)
Patient did not bring bottle with her today. Instructed to bring with her the next appointment. Patient had company at house and forget them.  States she has been out of medication 10 days.

## 2022-02-05 ENCOUNTER — Encounter: Payer: Medicare HMO | Attending: Physician Assistant | Admitting: Physician Assistant

## 2022-02-05 ENCOUNTER — Other Ambulatory Visit
Admission: RE | Admit: 2022-02-05 | Discharge: 2022-02-05 | Disposition: A | Discharge status: Home / Self Care | Payer: Medicare HMO | Source: Ambulatory Visit | Attending: Physician Assistant | Admitting: Physician Assistant

## 2022-02-05 DIAGNOSIS — S51011A Laceration without foreign body of right elbow, initial encounter: Secondary | ICD-10-CM | POA: Diagnosis not present

## 2022-02-05 DIAGNOSIS — I480 Paroxysmal atrial fibrillation: Secondary | ICD-10-CM | POA: Diagnosis not present

## 2022-02-05 DIAGNOSIS — E1151 Type 2 diabetes mellitus with diabetic peripheral angiopathy without gangrene: Secondary | ICD-10-CM | POA: Insufficient documentation

## 2022-02-05 DIAGNOSIS — I890 Lymphedema, not elsewhere classified: Secondary | ICD-10-CM | POA: Diagnosis not present

## 2022-02-05 DIAGNOSIS — E11621 Type 2 diabetes mellitus with foot ulcer: Secondary | ICD-10-CM | POA: Insufficient documentation

## 2022-02-05 DIAGNOSIS — M109 Gout, unspecified: Secondary | ICD-10-CM | POA: Insufficient documentation

## 2022-02-05 DIAGNOSIS — Z86718 Personal history of other venous thrombosis and embolism: Secondary | ICD-10-CM | POA: Insufficient documentation

## 2022-02-05 DIAGNOSIS — L97812 Non-pressure chronic ulcer of other part of right lower leg with fat layer exposed: Secondary | ICD-10-CM | POA: Insufficient documentation

## 2022-02-05 DIAGNOSIS — M1990 Unspecified osteoarthritis, unspecified site: Secondary | ICD-10-CM | POA: Insufficient documentation

## 2022-02-05 DIAGNOSIS — L97822 Non-pressure chronic ulcer of other part of left lower leg with fat layer exposed: Secondary | ICD-10-CM | POA: Insufficient documentation

## 2022-02-05 DIAGNOSIS — S51811A Laceration without foreign body of right forearm, initial encounter: Secondary | ICD-10-CM | POA: Insufficient documentation

## 2022-02-05 DIAGNOSIS — I872 Venous insufficiency (chronic) (peripheral): Secondary | ICD-10-CM | POA: Diagnosis not present

## 2022-02-05 DIAGNOSIS — I87333 Chronic venous hypertension (idiopathic) with ulcer and inflammation of bilateral lower extremity: Secondary | ICD-10-CM | POA: Insufficient documentation

## 2022-02-05 DIAGNOSIS — B999 Unspecified infectious disease: Secondary | ICD-10-CM | POA: Insufficient documentation

## 2022-02-05 DIAGNOSIS — Z7901 Long term (current) use of anticoagulants: Secondary | ICD-10-CM | POA: Insufficient documentation

## 2022-02-05 DIAGNOSIS — E1140 Type 2 diabetes mellitus with diabetic neuropathy, unspecified: Secondary | ICD-10-CM | POA: Insufficient documentation

## 2022-02-05 DIAGNOSIS — E11622 Type 2 diabetes mellitus with other skin ulcer: Secondary | ICD-10-CM | POA: Diagnosis not present

## 2022-02-05 NOTE — Progress Notes (Addendum)
REBA, HULETT ([id number]) [id number] Page 1 of 12 Visit Report for 02/05/2022 Allergy List Details Patient Name: Date of Service: [name] 02/05/2022 1:00 PM Medical Record Number: [id number] Patient Account Number: [id number] Date of Birth/Sex: Treating RN: 15-Jan-1951 (71 y.o. [name], [name], [name] Allergies Active Allergies Penicillins Reaction: hives Severity: Severe Active: 01/23/2013 Terramycin Reaction: hives Severity: Severe Active: 01/23/2013 aspirin Reaction: hives Active: 01/23/2013 Allergy Notes Electronic Signature(s) Signed: 02/05/2022 4:11:22 PM By: Rosalio Loud MSN RN CNS WTA Previous Signature: 02/05/2022 3:03:52 PM Version By: Rosalio Loud MSN RN CNS WTA Entered By: Rosalio Loud on 02/05/2022 16:11:21 -------------------------------------------------------------------------------- Arrival Information Details Patient Name: Date of Service: [name], Dawn Dodrill [name]. 02/05/2022 1:00 PM Medical Record Number: [id number] Patient Account Number: [id number] Date of Birth/Sex: Treating RN: 10-Jan-1951 (71 y.o. [name], [name], [name] Visit Information Patient Arrived: Wheel Chair Arrival Time: 13:15 Dawn, [name] ([id number]) Accompanied By: husband Transfer Assistance: None Patient Identification Verified: Yes Secondary Verification Process Completed: Yes Patient Requires Transmission-Based Precautions: No Patient Has Alerts: Yes Patient Alerts: Patient on Blood Thi DMII Eliquis History Since Last Visit Added or deleted any medications: Yes Any new allergies or adverse reactions: No [id number] Page 2 of  12 Had a fall or experienced change in activities of daily living that may affect risk of falls: No Signs or symptoms of abuse/neglect since last visito No Hospitalized since last visit: No Implantable device outside of the clinic excluding cellular tissue based products placed in the center nnerPain Present Now: No Electronic Signature(s) Signed: 02/05/2022 4:07:57 PM By: Rosalio Loud MSN RN CNS WTA Previous Signature: 02/05/2022 3:03:04 PM Version By: Rosalio Loud MSN RN CNS WTA Entered By: Rosalio Loud on 02/05/2022 16:07:57 -------------------------------------------------------------------------------- Clinic Level of Care Assessment Details Patient Name: Date of Service: Dawn [address], [place] [name]. 02/05/2022 1:00 PM Medical Record Number: [id number] Patient Account Number: [id number] Date of Birth/Sex: Treating RN: 1950-08-17 (71 y.o. [name] Primary Care : [facility], [place] Other Clinician: Referring : Treating /Extender: [name], [name] Clinic Level of Care Assessment Items TOOL 2 Quantity Score X- 1 [name] Use when only an EandM is performed on the INITIAL visit ASSESSMENTS - Nursing Assessment / Reassessment X- 1 20 General Physical Exam (combine w/ comprehensive assessment (listed just below) when performed on new pt. evals) X- 1 25 Comprehensive Assessment (HX, ROS, Risk Assessments, Wounds Hx, etc.) ASSESSMENTS - Wound and Skin A ssessment / Reassessment [] - [name] Simple Wound Assessment / Reassessment - one wound X- 2 5 Complex Wound Assessment / Reassessment - multiple wounds [] - [name] Dermatologic / Skin Assessment (not related to wound area) ASSESSMENTS - Ostomy and/or Continence Assessment and Care [] - [name] Incontinence Assessment and Management [] - [name] Ostomy Care Assessment and Management (repouching, etc.) PROCESS - Coordination of Care [] - [name] Simple Patient / Family Education for ongoing care [] - [name] Complex  (extensive) Patient / Family Education for ongoing care X- 1 10 Staff obtains Programmer, systems, Records, T Results / Process Orders est [] - [name] Staff telephones HHA, Nursing Homes / Clarify orders / etc [] - [name] Routine Transfer to another Facility (non-emergent condition) [] - [name] Routine [name] Admission (non-emergent condition) X- 1 15  New Admissions / Biomedical engineer / Ordering NPWT Apligraf, etc. , [] - [name] Emergency [name] Admission (emergent condition) [] - [name] Simple Discharge Coordination Dawn, KIMPEL [name] ([id number]) [id number] Page 3 of 12 X- 1 15 Complex (extensive) Discharge Coordination PROCESS - Special Needs [] - [name] Pediatric / Minor Patient Management [] - [name] Isolation Patient Management [] - [name] Hearing / Language / Visual special needs [] - [name] Assessment of Community assistance (transportation, D/C planning, etc.) [] - [name] Additional assistance / Altered mentation [] - [name] Support Surface(s) Assessment (bed, cushion, seat, etc.) INTERVENTIONS - Wound Cleansing / Measurement X- 1 5 Wound Imaging (photographs - any number of wounds) [] - [name] Wound Tracing (instead of photographs) [] - [name] Simple Wound Measurement - one wound X- 2 5 Complex Wound Measurement - multiple wounds [] - [name] Simple Wound Cleansing - one wound X- 2 5 Complex Wound Cleansing - multiple wounds INTERVENTIONS - Wound Dressings [] - [name] Small Wound Dressing one or multiple wounds X- 1 15 Medium Wound Dressing one or multiple wounds [] - [name] Large Wound Dressing one or multiple wounds [] - [name] Application of Medications - injection INTERVENTIONS - Miscellaneous [] - [name] External ear exam [] - [name] Specimen Collection (cultures, biopsies, blood, body fluids, etc.) X- 1 5 Specimen(s) / Culture(s) sent or taken to Lab for analysis [] - [name] Patient Transfer (multiple staff / Civil Service fast streamer / Similar devices) [] - [name] Simple Staple / Suture removal (25 or less) [] - [name] Complex  Staple / Suture removal (26 or more) [] - [name] Hypo / Hyperglycemic Management (close monitor of Blood Glucose) X- 1 15 Ankle / Brachial Index (ABI) - do not check if billed separately Has the patient been seen at the [name] within the last three years: Yes Total Score: 155 Level Of Care: New/Established - Level 4 Electronic Signature(s) Signed: 02/05/2022 4:56:08 PM By: Rosalio Loud MSN RN CNS WTA Entered By: Rosalio Loud on 02/05/2022 16:18:11 -------------------------------------------------------------------------------- Compression Therapy Details Patient Name: Date of Service: [name], Dawn Dodrill [name]. 02/05/2022 1:00 PM Medical Record Number: [id number] Patient Account Number: [id number] MILLETTE, HALBERSTAM ([id number]) [id number] Page 4 of 12 Date of Birth/Sex: Treating RN: 1950/08/06 (72 y.o. [name] Primary Care : Other Clinician: Danne Baxter, [place] Referring : Treating /Extender: [name], [name] Compression Therapy Performed for Wound Assessment: Wound #22 Right,Lateral,Anterior,Circumferential Lower Leg Performed By: Clinician Rosalio Loud, RN Compression Type: Three Layer Post Procedure Diagnosis Same as Pre-procedure Electronic Signature(s) Signed: 02/05/2022 3:09:49 PM By: Rosalio Loud MSN RN CNS WTA Entered By: Rosalio Loud on 02/05/2022 15:09:49 -------------------------------------------------------------------------------- Encounter Discharge Information Details Patient Name: Date of Service: [name], [place] [name]. 02/05/2022 1:00 PM Medical Record Number: [id number] Patient Account Number: [id number] Date of Birth/Sex: Treating RN: 09-29-1950 (71 y.o. [name] Primary Care : [facility], [place] Other Clinician: Referring : Treating /Extender: [name], [name] Encounter Discharge Information Items Post Procedure  Vitals Discharge Condition: Stable Temperature (F): 98.1 Ambulatory Status: Wheelchair Pulse (bpm): 85 Discharge Destination: Home Respiratory Rate (breaths/min): 16 Transportation: Private Auto Blood Pressure (mmHg): 145/81 Accompanied By: husband Schedule Follow-up Appointment: Yes Clinical Summary of Care: Patient Declined Electronic Signature(s) Signed: 02/05/2022 4:21:30 PM By: Rosalio Loud MSN RN CNS WTA Entered By: Rosalio Loud on 02/05/2022 16:21:30 -------------------------------------------------------------------------------- Lower Extremity Assessment Details Patient Name: Date of Service: [name], NA NCY [name]. 02/05/2022 1:00 PM Medical Record Number:  [id number] Patient Account Number: [id number] Date of Birth/Sex: Treating RN: 1951-03-03 (71 y.o. [name] Primary Care : [facility] [name], [name], Dawn Weeks in TreatmentLEKITA, KEREKES [name] ([id number]) [id number] Page 5 of 12 Edema Assessment Assessed: [Left: Yes] [Right: Yes] Edema: [Left: Yes] [Right: Yes] Calf Left: Right: Point of Measurement: 32 cm From Medial Instep 33 cm 30.2 cm Ankle Left: Right: Point of Measurement: 10 cm From Medial Instep 16.3 cm 18.2 cm Vascular Assessment Pulses: Dorsalis Pedis Palpable: [Left:Yes] [Right:Yes] Blood Pressure: Brachial: [Left:90] [Right:99] Ankle: [Left:Posterior Tibial: 109 1.10] [Right:Posterior Tibial: 111 1.12] Electronic Signature(s) Signed: 02/05/2022 4:11:17 PM By: Rosalio Loud MSN RN CNS WTA Previous Signature: 02/05/2022 3:03:45 PM Version By: Rosalio Loud MSN RN CNS WTA Entered By: Rosalio Loud on 02/05/2022 16:11:16 -------------------------------------------------------------------------------- Multi Wound Chart Details Patient Name: Date of Service: [name], Dawn Dodrill [name]. 02/05/2022 1:00 PM Medical Record Number: [id number] Patient  Account Number: [id number] Date of Birth/Sex: Treating RN: [birth year] (71 y.o. [name] Primary Care : [facility] [name], [name], [name] Vital Signs Height(in): 66 Pulse(bpm): 85 Weight(lbs): 932 Blood Pressure(mmHg): 145/81 Body Mass Index(BMI): 42.4 Temperature(F): 98.1 Respiratory Rate(breaths/min): 16 [22:Photos:] [N/A:N/A] Right, Lateral, Anterior, Circumferential Left, Midline Lower Leg N/A Wound Location: Lower Leg Gradually Appeared Gradually Appeared N/A Wounding Event: Diabetic Wound/Ulcer of the Lower Diabetic Wound/Ulcer of the Lower N/A Primary EtiologyEMOJEAN, GERTZ ([id number]) [id number] Page 6 of 12 Extremity Extremity Asthma, Pneumothorax, Arrhythmia, Asthma, Pneumothorax, Arrhythmia, N/A Comorbid History: Hypertension, Type II Diabetes, Gout, Hypertension, Type II Diabetes, Gout, Osteoarthritis, Neuropathy Osteoarthritis, Neuropathy 12/06/2021 12/06/2021 N/A Date Acquired: [name] N/A Weeks of Treatment: Open Open N/A Wound Status: No No N/A Wound Recurrence: Yes No N/A Clustered Wound: 15.8x12x0.1 [name].4x0.5x0.1 N/A Measurements L x W x D (cm) 148.911 [name].157 N/A A (cm) : rea 14.891 [name].016 N/A Volume (cm) : Grade 1 Grade 2 N/A Classification: Medium Medium N/A Exudate A mount: Serous Serous N/A Exudate Type: amber amber N/A Exudate Color: Small (1-33%) Small (1-33%) N/A Granulation A mount: Red, Pink Red N/A Granulation Quality: Fat Layer (Subcutaneous Tissue): Yes Fascia: No N/A Exposed Structures: Fascia: No Fat Layer (Subcutaneous Tissue): No Tendon: No Tendon: No Muscle: No Muscle: No Joint: No Joint: No Bone: No Bone: No N/A Small (1-33%) N/A Epithelialization: Chemical/Enzymatic/Mechanical N/A N/A Debridement: Other(saline gauze) N/A N/A Instrument: Minimum N/A N/A Bleeding: Pressure  N/A N/A Hemostasis A chieved: 2 N/A N/A Procedural Pain: 4 N/A N/A Post Procedural Pain: Debridement Treatment Response: Procedure was tolerated well N/A N/A Post Debridement Measurements L x 15.8x12x0.1 N/A N/A W x D (cm) 14.891 N/A N/A Post Debridement Volume: (cm) Compression Therapy N/A N/A Procedures Performed: Debridement Treatment Notes Electronic Signature(s) Signed: 02/05/2022 4:13:28 PM By: Rosalio Loud MSN RN CNS WTA Previous Signature: 02/05/2022 3:07:45 PM Version By: Rosalio Loud MSN RN CNS WTA Entered By: Rosalio Loud on 02/05/2022 16:13:28 -------------------------------------------------------------------------------- Multi-Disciplinary Care Plan Details Patient Name: Date of Service: [name], [place] [name]. 02/05/2022 1:00 PM Medical Record Number: [id number] Patient Account Number: [id number] Date of Birth/Sex: Treating RN: Mar 14, 1951 (71 y.o. [name] Primary Care : [facility], [place] Other Clinician: Referring : Treating /Extender: [name], [name] Active Inactive Orientation to the Wound Care Program Nursing Diagnoses: Knowledge deficit related to the wound healing center program Goals: Patient/caregiver will verbalize understanding of the Wound  [place] ([id number]) [id number] Page 7 of 12 Date Initiated: 02/05/2022 Target Resolution Date: 02/26/2022 Goal Status: Active Interventions: Provide education on orientation to the wound center Notes: Wound/Skin Impairment Nursing Diagnoses: Impaired tissue integrity Knowledge deficit related to ulceration/compromised skin integrity Goals: Patient will have a decrease in wound volume by X% from date: (specify in notes) Date Initiated: 02/05/2022 Target Resolution Date: 03/07/2022 Goal Status: Active Patient/caregiver will verbalize understanding of skin care regimen Date Initiated:  02/05/2022 Target Resolution Date: 03/07/2022 Goal Status: Active Ulcer/skin breakdown will have a volume reduction of 30% by week 4 Date Initiated: 02/05/2022 Target Resolution Date: 03/07/2022 Goal Status: Active Ulcer/skin breakdown will have a volume reduction of 50% by week 8 Date Initiated: 02/05/2022 Target Resolution Date: 04/07/2022 Goal Status: Active Ulcer/skin breakdown will have a volume reduction of 80% by week 12 Date Initiated: 02/05/2022 Target Resolution Date: 05/08/2022 Goal Status: Active Ulcer/skin breakdown will heal within 14 weeks Date Initiated: 02/05/2022 Target Resolution Date: 05/22/2022 Goal Status: Active Interventions: Assess patient/caregiver ability to obtain necessary supplies Assess patient/caregiver ability to perform ulcer/skin care regimen upon admission and as needed Assess ulceration(s) every visit Provide education on ulcer and skin care Treatment Activities: Referred to DME  for dressing supplies : 02/05/2022 Skin care regimen initiated : 02/05/2022 Notes: Electronic Signature(s) Signed: 02/05/2022 4:12:21 PM By: Rosalio Loud MSN RN CNS WTA Previous Signature: 02/05/2022 3:07:32 PM Version By: Rosalio Loud MSN RN CNS WTA Entered By: Rosalio Loud on 02/05/2022 16:12:21 -------------------------------------------------------------------------------- Pain Assessment Details Patient Name: Date of Service: [name], Dawn Dodrill [name]. 02/05/2022 1:00 PM Medical Record Number: [id number] Patient Account Number: [id number] Date of Birth/Sex: Treating RN: 12/18/1950 (71 y.o. [name] Primary Care : [name], [name], Dawn Weeks in Treatment: [address] TIWATOPE, [name] [name] ([id number]) [id number] Page 8 of 12 Location of Pain Severity and Description of Pain Patient Has Paino Yes Site Locations With Dressing Change:  Yes Rate the pain. Current Pain Level: 5 Worst Pain Level: 9 Least Pain Level: 2 Tolerable Pain Level: [name] Character of Pain Describe the Pain: Dull, Shooting, Throbbing Pain Management and Medication Current Pain Management: Medication: Yes Cold Application: No Rest: Yes Activity: No Heat Application: No How does your wound impact your activities of daily livingo Sleep: No Bathing: No Appetite: No Relationship With Others: No Bladder Continence: No Emotions: No Bowel Continence: No Work: No Toileting: No Drive: No Dressing: No Hobbies: No Electronic Signature(s) Signed: 02/05/2022 4:08:04 PM By: Rosalio Loud MSN RN CNS WTA Previous Signature: 02/05/2022 3:03:17 PM Version By: Rosalio Loud MSN RN CNS WTA Entered By: Rosalio Loud on 02/05/2022 16:08:04 -------------------------------------------------------------------------------- Patient/Caregiver Education Details Patient Name: Date of Service: [name] 11/16/2023andnbsp1:00 PM Medical Record Number: [id number] Patient Account Number: [id number] Date of Birth/Gender: Treating RN: [birth year] (71 y.o. [name] Primary Care Physician: [facility], [place] Other Clinician: Referring Physician: Treating Physician/Extender: [name], [name] Education Assessment Education Provided To: Patient Education Topics Provided MERIE, WULF ([id number]) [id number] Page 9 of [place]: o Handouts: Welcome T [facility] o Methods: Explain/Verbal Responses: State content correctly Wound/Skin Impairment: Handouts: Caring for Your Ulcer Methods: Explain/Verbal Responses: State content correctly Electronic Signature(s) Signed: 02/05/2022 4:56:08 PM By: Rosalio Loud MSN RN CNS WTA Entered By: Rosalio Loud on 02/05/2022 16:19:24 -------------------------------------------------------------------------------- Wound  Assessment Details Patient Name: Date of Service:  RO BERTS, NA NCY [name]. 02/05/2022 1:00 PM Medical Record Number: [id number] Patient Account Number: [id number] Date of Birth/Sex: Treating RN: [birth year] (71 y.o. [name] Primary Care Brownie Nehme: [facility], [place] Other Clinician: Referring Lizbeth Feijoo: Treating Kyshaun Barnette/Extender: [name], [name] Wound Status Wound Number: 22 Primary Diabetic Wound/Ulcer of the Lower Extremity Etiology: Wound Location: Right, Lateral, Anterior, Circumferential Lower Leg Wound Open Wounding Event: Gradually Appeared Status: Date Acquired: 12/06/2021 Comorbid Asthma, Pneumothorax, Arrhythmia, Hypertension, Type II Weeks Of Treatment: [name] History: Diabetes, Gout, Osteoarthritis, Neuropathy Clustered Wound: Yes Photos Wound Measurements Length: (cm) Width: (cm) Depth: (cm) Area: (cm) Volume: (cm) 15.8 % Reduction in Area: 12 % Reduction in Volume: [name].1 148.911 14.891 Wound Description Classification: Grade 1 Exudate Amount: Medium Exudate Type: Serous Exudate Color: amber Foul Odor After Cleansing: No Slough/Fibrino No Wound Bed EMONI, WHITWORTH [name] ([id number]) [id number] Page 10 of 12 Granulation Amount: Small (1-33%) Exposed Structure Granulation Quality: Red, Pink Fascia Exposed: No Fat Layer (Subcutaneous Tissue) Exposed: Yes Tendon Exposed: No Muscle Exposed: No Joint Exposed: No Bone Exposed: No Treatment Notes Wound #22 (Lower Leg) Wound Laterality: Right, Lateral, Anterior, Circumferential Cleanser Soap and Water Discharge Instruction: Gently cleanse wound with antibacterial soap, rinse and pat dry prior to dressing wounds Peri-Wound Care Topical Primary Dressing Silvercel 4 1/4x 4 1/4 (in/in) Discharge Instruction: Apply Silvercel 4 1/4x 4 1/4 (in/in) as instructed Secondary Dressing ABD Pad 5x9 (in/in) Discharge Instruction: Cover with ABD pad Secured  With Compression Wrap 3-LAYER WRAP - Profore Lite LF 3 Multilayer Compression Bandaging System Discharge Instruction: Apply 3 multi-layer wrap as prescribed. Compression Stockings Add-Ons Electronic Signature(s) Signed: 02/05/2022 4:08:58 PM By: Rosalio Loud MSN RN CNS WTA Entered By: Rosalio Loud on 02/05/2022 16:08:57 -------------------------------------------------------------------------------- Wound Assessment Details Patient Name: Date of Service: [name], Dawn Dodrill [name]. 02/05/2022 1:00 PM Medical Record Number: [id number] Patient Account Number: [id number] Date of Birth/Sex: Treating RN: 09-18-1950 (71 y.o. [name] Primary Care Polette Nofsinger: [facility], [place] Other Clinician: Referring Jazzy Parmer: Treating Nathalee Smarr/Extender: [name], [name] Wound Status Wound Number: 23 Primary Diabetic Wound/Ulcer of the Lower Extremity Etiology: Wound Location: Left, Midline Lower Leg Wound Open Wounding Event: Gradually Appeared Status: Date Acquired: 12/06/2021 Comorbid Asthma, Pneumothorax, Arrhythmia, Hypertension, Type II Weeks Of Treatment: [name] History: Diabetes, Gout, Osteoarthritis, Neuropathy Clustered Wound: No Photos ADRIANA, QUINBY [name] ([id number]) [id number] Page 11 of 12 Wound Measurements Length: (cm) [name].4 Width: (cm) [name].5 Depth: (cm) [name].1 Area: (cm) [name].157 Volume: (cm) [name].016 % Reduction in Area: % Reduction in Volume: Epithelialization: Small (1-33%) Tunneling: No Undermining: No Wound Description Classification: Grade 2 Exudate Amount: Medium Exudate Type: Serous Exudate Color: amber Foul Odor After Cleansing: No Slough/Fibrino No Wound Bed Granulation Amount: Small (1-33%) Exposed Structure Granulation Quality: Red Fascia Exposed: No Fat Layer (Subcutaneous Tissue) Exposed: No Tendon Exposed: No Muscle Exposed: No Joint Exposed: No Bone Exposed: No Treatment Notes Wound #23 (Lower Leg) Wound  Laterality: Left, Midline Cleanser Byram Ancillary Kit - 15 Day Supply Discharge Instruction: Use supplies as instructed; Kit contains: (15) Saline Bullets; (15) 3x3 Gauze; 15 pr Gloves Soap and Water Discharge Instruction: Gently cleanse wound with antibacterial soap, rinse and pat dry prior to dressing wounds Peri-Wound Care Topical Primary Dressing Silvercel 4 1/4x 4 1/4 (in/in) Discharge Instruction: Apply Silvercel 4 1/4x 4 1/4 (in/in) as instructed Secondary Dressing (BORDER) Zetuvit Plus SILICONE BORDER Dressing 4x4 (in/in) Discharge Instruction: Please do not put silicone bordered dressings under wraps. Use non-bordered  dressing only. Secured With Compression Wrap Compression Stockings Environmental education officer) Signed: 02/05/2022 4:09:37 PM By: Rosalio Loud MSN RN CNS WTA Entered By: Rosalio Loud on 02/05/2022 16:09:36 [name] [name] ([id number]) [id number] Page 12 of 12 -------------------------------------------------------------------------------- Vitals Details Patient Name: Date of Service: [name] 02/05/2022 1:00 PM Medical Record Number: [id number] Patient Account Number: [id number] Date of Birth/Sex: Treating RN: 08-Jul-1950 (71 y.o. [name] Primary Care Harvel Meskill: [facility], [place] Other Clinician: Referring Adaya Garmany: Treating Lacretia Tindall/Extender: [name], [name] Vital Signs Time Taken: 13:28 Temperature (F): 98.1 Height (in): 66 Pulse (bpm): 85 Source: Stated Respiratory Rate (breaths/min): 16 Weight (lbs): 263 Blood Pressure (mmHg): 145/81 Source: Stated Reference Range: 80 - 120 mg / dl Body Mass Index (BMI): 42.4 Electronic Signature(s) Signed: 02/05/2022 4:08:12 PM By: Rosalio Loud MSN RN CNS WTA Previous Signature: 02/05/2022 3:03:25 PM Version By: Rosalio Loud MSN RN CNS WTA Entered By: Rosalio Loud on 02/05/2022 16:08:12

## 2022-02-05 NOTE — Progress Notes (Signed)
MARKALA, SITTS ([id number]) [phone] [id number] Page 1 of 5 Visit Report for 02/05/2022 Abuse Risk Screen Details Patient Name: Date of Service: [name] 02/05/2022 1:00 PM Medical Record Number: [id number] Patient Account Number: [id number] Date of Birth/Sex: Treating RN: June 02, 1950 (71 y.o. [name] Primary Care Zivah Mayr: [facility], [place] Other Clinician: Referring Nianna Igo: Treating Trevaun Rendleman/Extender: Jeri Cos Self, Referral Weeks in Treatment: 0 Abuse Risk Screen Items Answer Electronic Signature(s) Signed: 02/05/2022 4:11:35 PM By: Rosalio Loud MSN RN CNS WTA Previous Signature: 02/05/2022 3:04:46 PM Version By: Rosalio Loud MSN RN CNS WTA Entered By: Rosalio Loud on 02/05/2022 16:11:35 -------------------------------------------------------------------------------- Activities of Daily Living Details Patient Name: Date of Service: [name] 02/05/2022 1:00 PM Medical Record Number: [id number] Patient Account Number: [id number] Date of Birth/Sex: Treating RN: May 15, 1950 (71 y.o. [name] Primary Care Matty Deamer: [facility], [place] Other Clinician: Referring Alaycia Eardley: Treating Nakeeta Sebastiani/Extender: Jeri Cos Self, Referral Weeks in Treatment: 0 Activities of Daily Living Items Answer Activities of Daily Living (Please select one for each item) Drive Automobile Completely Able T Medications ake Completely Able Use T elephone Completely Able Care for Appearance Completely Able Use T oilet Completely Able Bath / Shower Need Assistance Dress Self Need Assistance Feed Self Completely Able Walk Need Assistance Get In / Out Bed Need Assistance Housework Need Assistance Prepare Meals Need Assistance Handle Money Completely Able Shop for Self Need Assistance [name], [name] ([id number]) [phone] [id number] Page 2 of 5 Electronic Signature(s) Signed: 02/05/2022 4:11:41 PM By: Rosalio Loud MSN RN  CNS WTA Previous Signature: 02/05/2022 3:04:51 PM Version By: Rosalio Loud MSN RN CNS WTA Entered By: Rosalio Loud on 02/05/2022 16:11:41 -------------------------------------------------------------------------------- Education Screening Details Patient Name: Date of Service: [name], Dawn Dodrill [name]. 02/05/2022 1:00 PM Medical Record Number: [id number] Patient Account Number: [id number] Date of Birth/Sex: Treating RN: 01-13-1951 (71 y.o. [name] Primary Care Troyce Febo: [facility], [place] Other Clinician: Referring Opie Fanton: Treating Shaunika Italiano/Extender: Jeri Cos Self, Referral Weeks in Treatment: 0 Primary Learner Assessed: Patient Learning Preferences/Education Level/Primary Language Learning Preference: Demonstration Highest Education Level: High School Preferred Language: English Cognitive Barrier Language Barrier: No Translator Needed: No Memory Deficit: No Emotional Barrier: No Cultural/Religious Beliefs Affecting Medical Care: No Physical Barrier Impaired Vision: Yes Contacts Impaired Hearing: No Decreased Hand dexterity: No Knowledge/Comprehension Knowledge Level: High Comprehension Level: High Ability to understand written instructions: High Ability to understand verbal instructions: High Motivation Anxiety Level: Calm Cooperation: Cooperative Education Importance: Acknowledges Need Interest in Health Problems: Asks Questions Perception: Coherent Willingness to Engage in Self-Management High Activities: Readiness to Engage in Self-Management High Activities: Electronic Signature(s) Signed: 02/05/2022 4:11:47 PM By: Rosalio Loud MSN RN CNS WTA Previous Signature: 02/05/2022 3:04:59 PM Version By: Rosalio Loud MSN RN CNS WTA Entered By: Rosalio Loud on 02/05/2022 16:11:47 [name] ([id number]) [id number] Page 3 of 5 -------------------------------------------------------------------------------- Fall Risk Assessment  Details Patient Name: Date of Service: [name] 02/05/2022 1:00 PM Medical Record Number: [id number] Patient Account Number: [id number] Date of Birth/Sex: Treating RN: [birth year] (71 y.o. [name] Primary Care Elmus Mathes: [facility], [place] Other Clinician: Referring Lakota Schweppe: Treating Maelyn Berrey/Extender: Jeri Cos Self, Referral Weeks in Treatment: 0 Fall Risk Assessment Items Have you had 2 or more falls in the last 12 monthso 0 No Have you had any fall that resulted in injury in the last 12 monthso 0 No FALLS RISK SCREEN History of falling - immediate or within 3 months 0  No Secondary diagnosis (Do you have 2 or more medical diagnoseso) 0 No Ambulatory aid None/bed rest/wheelchair/nurse 0 Yes Crutches/cane/walker 15 Yes Furniture 0 No Intravenous therapy Access/Saline/Heparin Lock 0 No Gait/Transferring Normal/ bed rest/ wheelchair 0 No Weak (short steps with or without shuffle, stooped but able to lift head while walking, may seek 0 No support from furniture) Impaired (short steps with shuffle, may have difficulty arising from chair, head down, impaired 0 No balance) Mental Status Oriented to own ability 0 No Electronic Signature(s) Signed: 02/05/2022 4:11:53 PM By: Rosalio Loud MSN RN CNS WTA Previous Signature: 02/05/2022 3:05:16 PM Version By: Rosalio Loud MSN RN CNS WTA Entered By: Rosalio Loud on 02/05/2022 16:11:53 -------------------------------------------------------------------------------- Foot Assessment Details Patient Name: Date of Service: [name], Dawn Dodrill [name]. 02/05/2022 1:00 PM Medical Record Number: [id number] Patient Account Number: [id number] Date of Birth/Sex: Treating RN: 11/06/1950 (71 y.o. [name] Primary Care Earnie Rockhold: [facility], [place] Other Clinician: Referring Rolando Whitby: Treating Shawnta Schlegel/Extender: Jeri Cos Self, Referral Weeks in Treatment: 0 Foot Assessment Items [address] [name], [name] ([id number])  [id number] Page 4 of 5 + = Sensation present, - = Sensation absent, C = Callus, U = Ulcer R = Redness, W = Warmth, M = Maceration, PU = Pre-ulcerative lesion F = Fissure, S = Swelling, D = Dryness Assessment Right: Left: Other Deformity: No No Prior Foot Ulcer: Yes Yes Prior Amputation: No No Charcot Joint: Yes Yes Ambulatory Status: Ambulatory With Help Assistance Device: Wheelchair Gait: Administrator, arts) Signed: 02/05/2022 4:12:14 PM By: Rosalio Loud MSN RN CNS WTA Previous Signature: 02/05/2022 3:05:45 PM Version By: Rosalio Loud MSN RN CNS WTA Entered By: Rosalio Loud on 02/05/2022 16:12:14 -------------------------------------------------------------------------------- Nutrition Risk Screening Details Patient Name: Date of Service: [name], Dawn Dodrill [name]. 02/05/2022 1:00 PM Medical Record Number: [id number] Patient Account Number: [id number] Date of Birth/Sex: Treating RN: 02/20/1951 (71 y.o. [name] Primary Care Lexa Coronado: [facility], [place] Other Clinician: Referring Egidio Lofgren: Treating Benedict Kue/Extender: Jeri Cos Self, Referral Weeks in Treatment: 0 Height (in): 66 Weight (lbs): 263 Body Mass Index (BMI): 42.4 Nutrition Risk Screening Items Score Screening NUTRITION RISK SCREEN: I have an illness or condition that made me change the kind and/or amount of food I eat 0 No I eat fewer than two meals per day 0 No I eat few fruits and vegetables, or milk products 0 No I have three or more drinks of beer, liquor or wine almost every day 0 No [name], [name] ([id number]) [id number] Page 5 of 5 I have tooth or mouth problems that make it hard for me to eat 0 No I don't always have enough money to buy the food I need 0 No I eat alone most of the time 0 No I take three or more different prescribed or over-the-counter drugs a day 1 Yes Without wanting to, I have lost or gained 10 pounds in  the last six months 0 No I am not always physically able to shop, cook and/or feed myself 0 No Nutrition Protocols Good Risk Protocol 0 No interventions needed Moderate Risk Protocol High Risk Proctocol Risk Level: Good Risk Score: 1 Electronic Signature(s) Signed: 02/05/2022 4:12:06 PM By: Rosalio Loud MSN RN CNS WTA Previous Signature: 02/05/2022 3:05:25 PM Version By: Rosalio Loud MSN RN CNS WTA Entered By: Rosalio Loud on 02/05/2022 16:12:06

## 2022-02-06 ENCOUNTER — Ambulatory Visit: Payer: Medicare HMO | Attending: Nurse Practitioner | Admitting: Nurse Practitioner

## 2022-02-06 ENCOUNTER — Other Ambulatory Visit
Admission: RE | Admit: 2022-02-06 | Discharge: 2022-02-06 | Disposition: A | Discharge status: Home / Self Care | Payer: Medicare HMO | Source: Ambulatory Visit | Attending: Nurse Practitioner | Admitting: Nurse Practitioner

## 2022-02-06 ENCOUNTER — Encounter: Payer: Self-pay | Admitting: Nurse Practitioner

## 2022-02-06 ENCOUNTER — Telehealth: Payer: Self-pay

## 2022-02-06 VITALS — BP 120/60 | HR 76 | Ht 66.0 in | Wt 265.5 lb

## 2022-02-06 DIAGNOSIS — E119 Type 2 diabetes mellitus without complications: Secondary | ICD-10-CM | POA: Diagnosis not present

## 2022-02-06 DIAGNOSIS — I480 Paroxysmal atrial fibrillation: Secondary | ICD-10-CM | POA: Insufficient documentation

## 2022-02-06 DIAGNOSIS — G4733 Obstructive sleep apnea (adult) (pediatric): Secondary | ICD-10-CM | POA: Diagnosis not present

## 2022-02-06 DIAGNOSIS — I5032 Chronic diastolic (congestive) heart failure: Secondary | ICD-10-CM | POA: Diagnosis not present

## 2022-02-06 DIAGNOSIS — N183 Chronic kidney disease, stage 3 unspecified: Secondary | ICD-10-CM | POA: Diagnosis not present

## 2022-02-06 DIAGNOSIS — I4819 Other persistent atrial fibrillation: Secondary | ICD-10-CM | POA: Insufficient documentation

## 2022-02-06 DIAGNOSIS — I5033 Acute on chronic diastolic (congestive) heart failure: Secondary | ICD-10-CM | POA: Insufficient documentation

## 2022-02-06 DIAGNOSIS — L97822 Non-pressure chronic ulcer of other part of left lower leg with fat layer exposed: Secondary | ICD-10-CM | POA: Diagnosis not present

## 2022-02-06 LAB — BASIC METABOLIC PANEL
Anion gap: 10 (ref 5–15)
BUN: 29 mg/dL — ABNORMAL HIGH (ref 8–23)
CO2: 27 mmol/L (ref 22–32)
Calcium: 8.7 mg/dL — ABNORMAL LOW (ref 8.9–10.3)
Chloride: 103 mmol/L (ref 98–111)
Creatinine, Ser: 1.25 mg/dL — ABNORMAL HIGH (ref 0.44–1.00)
GFR, Estimated: 46 mL/min — ABNORMAL LOW (ref >60)
Glucose, Bld: 181 mg/dL — ABNORMAL HIGH (ref 70–99)
Potassium: 4.2 mmol/L (ref 3.5–5.1)
Sodium: 140 mmol/L (ref 135–145)

## 2022-02-06 NOTE — Progress Notes (Signed)
LAINEE, LEHRMAN ([id number]) [id number] Page 1 of 12 Visit Report for 02/05/2022 Chief Complaint Document Details Patient Name: Date of Service: Iva Lento 02/05/2022 1:00 PM Medical Record Number: [id number] Patient Account Number: [id number] Date of Birth/Sex: Treating RN: [birth year] (71 y.o. [name] Primary Care Provider: [facility], [place] Other Clinician: Referring Provider: Treating Provider/Extender: Jeri Cos Self, Referral Weeks in Treatment: 0 Information Obtained from: Patient Chief Complaint Bilateral Leg Ulcers Electronic Signature(s) Signed: 02/05/2022 2:15:43 PM By: Worthy Keeler PA-C Entered By: Worthy Keeler on 02/05/2022 14:15:42 -------------------------------------------------------------------------------- Debridement Details Patient Name: Date of Service: [name], [name] 02/05/2022 1:00 PM Medical Record Number: [id number] Patient Account Number: [id number] Date of Birth/Sex: Treating RN: Jun 23, 1950 (70 y.o. [name] Primary Care Provider: [facility], [place] Other Clinician: Referring Provider: Treating Provider/Extender: Jeri Cos Self, Referral Weeks in Treatment: 0 Debridement Performed for Assessment: Wound #22 Right,Lateral,Anterior,Circumferential Lower Leg Performed By: Physician Tommie Sams., PA-C Debridement Type: Chemical/Enzymatic/Mechanical Agent Used: Saline gauze Severity of Tissue Pre Debridement: Fat layer exposed Level of Consciousness (Pre-procedure): Awake and Alert Pre-procedure Verification/Time Out No Taken: Start Time: 14:45 Instrument: Other : saline gauze Bleeding: Minimum Hemostasis Achieved: Pressure End Time: 14:50 Procedural Pain: 2 Post Procedural Pain: 4 Response to Treatment: Procedure was tolerated well Level of Consciousness (Post- Awake and Alert procedure): ALEXINE, PILANT B ([id number]) [id number] Page 2 of 12 Post Debridement  Measurements of Total Wound Length: (cm) 15.8 Width: (cm) 12 Depth: (cm) 0.1 Volume: (cm) 14.891 Character of Wound/Ulcer Post Debridement: Stable Severity of Tissue Post Debridement: Fat layer exposed Post Procedure Diagnosis Same as Pre-procedure Electronic Signature(s) Signed: 02/05/2022 3:17:22 PM By: Rosalio Loud MSN RN CNS WTA Signed: 02/06/2022 2:32:55 PM By: Worthy Keeler PA-C Entered By: Rosalio Loud on 02/05/2022 15:17:21 -------------------------------------------------------------------------------- HPI Details Patient Name: Date of Service: [name], [name] 02/05/2022 1:00 PM Medical Record Number: [id number] Patient Account Number: [id number] Date of Birth/Sex: Treating RN: [birth year] (71 y.o. [name] Primary Care Provider: [facility], [place] Other Clinician: Referring Provider: Treating Provider/Extender: Jeri Cos Self, Referral Weeks in Treatment: 0 History of Present Illness HPI Description: 06/28/20 upon evaluation today patient appears to be doing unfortunately somewhat poorly in regard to wounds that she has over her lower extremities. She has a wound on the left distal second toe, left posterior heel, and right anterior lower leg. With that being said all in all none of the wounds appear to be too bad the one that hurts her the most is actually the wound on the posterior heel which actually is also one of the smallest. Nonetheless I think there is some callus hiding what is really going on in this area. Fortunately there does not appear to be any obvious evidence of infection which is great news. Patient does have a history of diabetes mellitus type 2, chronic venous insufficiency, hypertension, history of DVT and is on long-term anticoagulant s, therapy, Eliquis. 07/05/20 upon evaluation today patient appears to be doing well currently with regard to her wounds. I feel like she is making progress which is great news and overall there is no signs of  active infection at this time. No fevers, chills, nausea, vomiting, or diarrhea. 4/29; patient has a only a small area remaining on the tip of her left second toe and a small area remaining on the left heel. The area on the right anterior lower leg is healed. They tell me that she had  remote podiatric surgery on the toes of the left foot however they are very fixed in position and I wonder whether they are making it difficult to relieve pressure in her foot wear. She is a type II diabetic with neuropathy as well. 08/01/2020 upon evaluation today patient appears to be doing well currently in regard to her wounds. In fact everything appears to be doing much better. The posterior aspect of her heel is actually giving her some trouble not the Achilles area but a small wound that we did not even have marked. In fact I had noted this before. Nonetheless we are going to addressed that today. 5/27; most of the patient's wounds are healed including the left Achilles heel left toe. Right forearm is also healed. She has a new abrasion posteriorly in the right elbow area just above the olecranon this happened today with an abrasion from her car door 6/15; the patient's wounds on the left Achilles and left second toe remain healed. The area on the right forearm is also just about healed in fact the skin I replaced last time is looking viable which is good. She had me look at the left heel again which is not in the area of the wound more towards the tip of the heel at the Achilles insertion site as well as the tip of the left foot second toe. In the Achilles area that is clearly is friction probably in bed at night. Not really near where the wound was. The left second toe remains healed although this is hammered and overhanging first toe also puts pressure in this area 09/13/2020 upon evaluation today patient appears to be doing well with regard to all previous wounds that she had. Everything is completely healed. With  that being said I do think that the patient unfortunately has a new skin tear on the right forearm which is due to her put a Band-Aid on it and then when it pulled off this caused some issues with skin tearing. Nonetheless I think that this needs to be addressed today. Hopefully will take too long to get this to heal. She is having some issues with pain in her heel but I think this is more related to be honest to her neuropathy as opposed to anything else. Readmission: 10/30/2020 upon evaluation today patient appears to be doing poorly in regard to her legs bilaterally. She is also having an issue with her fourth toe on the left. Fortunately there does not appear to be any signs of active infection at this time. No fevers, chills, nausea, vomiting, or diarrhea. It is actually been quite a while since we have seen her. In fact June 24 was the last time that she was here in the clinic. She tells me she did not come back because things were just doing "pretty well". 11/29/2020 upon evaluation today patient actually appears to be doing well with regard to her arms those are completely healed. Her right leg is still open but doing okay. The fourth toe of the left foot does show some signs here of having some issues with necrotic tissue of an open wound. This is where a toenail was removed by podiatry. Subsequently I do think that we need to go ahead and see what we can do about getting this cleared away so being try to get some healing going forward. KASSIDIE, HENDRIKS ([id number]) [id number] Page 3 of 12 12/13/2020 upon evaluation today patient appears to be doing decently well in regard to her  wound on the right leg. In general I think that she is making good progress here. Unfortunately she continues to have significant issues with swelling in the left leg is now even more swollen at this point. For that reason I do believe she would be benefited by wrapping both sides although  she is not interested in doing this. Overall I think that she is definitely making some good progress here nonetheless in regard to the right leg left leg leave some to be desired. 12/27/2020 upon evaluation today patient appears to be doing well with regard to her wound in regard to the legs. Unfortunately the toe was not doing nearly as well. I am much more concerned about infection and osteomyelitis here. She had the MRI but right now were not seeing obvious evidence of redness spreading up to her foot this is good news I do not have the result of the MRI as of yet. I am good to place her on doxycycline however due to the fact that the wound does seem to be getting a little worse in regard to her toe. 01/14/2021 upon evaluation today patient appears to be doing well with regard to her ulcers. The right leg is doing well and even the toe seems to be doing well though she still has some evidence of bone exposed on the toe which does not appear to be doing quite as good as I would like to see just and the fact that is still not covered over new tissue but looks tremendously better compared to 2 weeks ago. Overall very pleased in that regard. 01/28/2021 upon evaluation today patient's leg on the left actually appears to have new wounds this is due to her deciding to shave yesterday. Fortunately there does not appear to be any signs of infection currently. This is good news. Nonetheless I do believe that with these new areas open it would be best to wrap her leg to try to make sure we get this under control sooner rather than later. With regard to her toe there still does appear to be some evidence of bone exposed this was covered over with callus I did remove that today. Nonetheless I think this is significantly smaller than previous. 02/11/2021 upon evaluation today patient appears to be doing okay in regard to her leg ulcers. I do not see anything obvious at this point this seems to be a complicating  factor. I think that she is getting better. Were wrap of the right leg not the left leg. With that being said the left fourth toe ulcer still does have evidence of being open there is some drainage still on top of the wound I think we can to try to see about using something like a collagen dressing today to see if that would be of benefit for her. She is in agreement with giving this a trial. Nonetheless I am concerned about the fact that she continues to have issues here with this not healing it is confirmed chronic osteomyelitis she has been on antibiotics. I am going to extend those today as well. That is Keflex and that will be for 1 additional month. Nonetheless if she is not doing significantly better by the next time I see her I think that the biggest thing is going to be for [place] to go ahead and see what we can do about getting her started with hyperbaric oxygen therapy to try to save the toe. 02/20/2021 upon evaluation today patient presents for follow-up she  actually came in a little bit early as the home health nurse was concerned about her toe becoming "gangrenous". With that being said the good news is there is [place] no evidence of this and in fact the toe seems to be doing better at this point. What was over the toe was an area that had bled significantly and then subsequently dried making it look much worse than where things actually stand. There is just a very small opening remaining in the toe. In regard to her legs everything appears to be healed bilaterally. 02/24/2021 upon evaluation today patient appears to be doing well with regard to her toe ulcer. I am actually very pleased with where we stand and I think that she is making good progress at this time. There is still a very small opening although I think the biggest issue is the collagen seems to be getting stuck and drying up because the wound is so small we probably just need to use something to keep this moist like a little piece  of Xeroform gauze and see if we get this to close. 03/07/2021 upon evaluation patient actually appears to be making excellent progress in regard to her last wound on the toe. There is just a very tiny potential area that I can even be sure at first until I get some of the dry skin off when I did get this removed there was just a very tiny pinpoint opening that was barely even moist. Actually feel like this is very close to complete resolution. She has been using the Xeroform that is doing great. 03/25/2021 upon evaluation today patient appears to be doing well with regard to her toe ulcer. In fact this appears to be potentially completely healed. Fortunately I do not see anything draining at this point which is great news. She does have a small blood blister that is already drying up and looking okay on the third toe as well I am not sure what happened here she has an either. Otherwise she is having some weeping from the right leg this again is newer she tells me from last week weighing in that her pulmonologist and then today weighing in at the hematologist that she gained 30 pounds. This is quite significant if indeed true and I think she is to contact her primary care provider ASAP. This would account for why her right leg is weeping as well. 04/04/2020 upon evaluation today patient appears to be doing excellent in regard to her toe which I actually think is healed today. With that being said she unfortunately is having issues with her leg swelling and again several blisters on each leg that have reopened unfortunately. 05/02/2021 upon evaluation today patient appears to be doing well with regard to her wounds. In fact is down just 1 on the anterior portion of her right leg which is actually from a blister. Otherwise she is doing awesome. 05/15/2021 upon evaluation today patient appears to be doing better in regard to her leg ulcer although she has a small irritated area from tape on the leg medially.  She also has a skin tear to her right elbow region. Fortunately there does not appear to be any signs of active infection locally nor systemically at this point which is great news. No fevers, chills, nausea, vomiting, or diarrhea. 05/26/2021 upon evaluation today patient appears to be doing better in regard to the actual wounds although she is having a lot of leaking from the right leg. She is also  been using her lymphedema pumps along with wearing her compression. Nonetheless she is having a lot of trouble with her breathing I am concerned that the pumps may be contributing to this. 06/12/2021 upon evaluation today patient appears to be doing better in regard to a lot of things including her breathing as well as her legs. She still has a lot of swelling but the good news is this is better she lost about 20 pounds of fluid when she went to the hospital and they diuresed her. Subsequently this is good news. Nonetheless she actually told me that "if she ever gets that bad again for me to yell at her to go to the hospital". She said that I was not quite forceful enough and telling her what she needed to do. She also notes she will try to be more receptive in the future. 06/19/2021 upon evaluation today patient appears to be doing well with regard to her wound. She is tolerating the dressing changes without complication. Fortunately her legs on the right are completely healed. She did have an area that she hit her Achilles on the left but this too seems to be pretty much closed at this point. Readmission: 02-05-2022 upon evaluation today patient appears to be doing well currently in regard to her left leg although her right leg is actually giving her quite a bit more trouble at this point. Fortunately there does not appear to be any signs of infection locally or systemically which is great news. No fevers, chills, nausea, vomiting, or diarrhea. Patient tells me that she began having issues more recently with  her legs in the right leg is definitely worse than the left as far as the way she feels as well and she states it was around September 16 that she first noted this and then when she called it took about 2-1/2 weeks to get in to see me. Her past medical history really has not changed since I saw her back in March everything is pretty much about the same repeat ABIs appear to be doing well currently at 1.1 on the left and 1.12 on the right. Electronic Signature(s) Signed: 02/05/2022 2:59:30 PM By: Worthy Keeler PA-C Entered By: Worthy Keeler on 02/05/2022 14:59:30 [name] ([id number]) [id number] Page 4 of 12 -------------------------------------------------------------------------------- Physical Exam Details Patient Name: Date of Service: Iva Lento 02/05/2022 1:00 PM Medical Record Number: [id number] Patient Account Number: [id number] Date of Birth/Sex: Treating RN: 07/01/1950 (71 y.o. [name] Primary Care Provider: [facility], [place] Other Clinician: Referring Provider: Treating Provider/Extender: Jeri Cos Self, Referral Weeks in Treatment: 0 Constitutional patient is hypertensive.. pulse regular and within target range for patient.Marland Kitchen respirations regular, non-labored and within target range for patient.Marland Kitchen temperature within target range for patient.. Obese and well-hydrated in no acute distress. Eyes conjunctiva clear no eyelid edema noted. pupils equal round and reactive to light and accommodation. Ears, Nose, Mouth, and Throat no gross abnormality of ear auricles or external auditory canals. normal hearing noted during conversation. mucus membranes moist. Respiratory normal breathing without difficulty. Cardiovascular 2+ dorsalis pedis/posterior tibialis pulses. 1+ pitting edema of the bilateral lower extremities. Musculoskeletal normal gait and posture. no significant deformity or arthritic changes, no loss or range of motion,  no clubbing. Psychiatric this patient is able to make decisions and demonstrates good insight into disease process. Alert and Oriented x 3. pleasant and cooperative. Notes Patient's wounds currently on the left leg are very superficial and actually appear  to be almost completely healed which is great news. The right leg is a completely different story there appears to be cellulitis and I think that this is a bit more of an issue here. Fortunately I do not see any signs of infection systemically which is good news though I do believe that she needs to have a compression wrap on the right leg I do not think that she is can be able to use her stockings very well at all at this point. She voiced understanding and is in agreement with that plan. Electronic Signature(s) Signed: 02/05/2022 3:00:11 PM By: Worthy Keeler PA-C Entered By: Worthy Keeler on 02/05/2022 15:00:10 -------------------------------------------------------------------------------- Physician Orders Details Patient Name: Date of Service: [name], [name] 02/05/2022 1:00 PM Medical Record Number: [id number] Patient Account Number: [id number] Date of Birth/Sex: Treating RN: 1950/04/10 (71 y.o. [name] Primary Care Provider: [facility], [place] Other Clinician: Referring Provider: Treating Provider/Extender: Jeri Cos Self, Referral Weeks in Treatment: 0 Verbal / Phone Orders: No SERAYA, JOBST B ([id number]) [id number] Page 5 of 12 Diagnosis Coding ICD-10 Coding Code Description E11.621 Type 2 diabetes mellitus with foot ulcer I87.333 Chronic venous hypertension (idiopathic) with ulcer and inflammation of bilateral lower extremity L97.812 Non-pressure chronic ulcer of other part of right lower leg with fat layer exposed L97.822 Non-pressure chronic ulcer of other part of left lower leg with fat layer exposed Z79.01 Long term (current) use of anticoagulants I48.0 Paroxysmal atrial  fibrillation Follow-up Appointments ppointment in: - 4 days Return A Bathing/ Shower/ Hygiene Wound #22 Right,Lateral,Anterior,Circumferential Lower Leg Wash wounds with antibacterial soap and water. - Left leg May shower with wound dressing protected with water repellent cover or cast protector. - Left leg-change dressing Saturday Wound Treatment Wound #22 - Lower Leg Wound Laterality: Right, Lateral, Anterior, Circumferential Cleanser: Soap and Water 1 x Per Week/30 Days Discharge Instructions: Gently cleanse wound with antibacterial soap, rinse and pat dry prior to dressing wounds Prim Dressing: Silvercel 4 1/4x 4 1/4 (in/in) 1 x Per Week/30 Days ary Discharge Instructions: Apply Silvercel 4 1/4x 4 1/4 (in/in) as instructed Secondary Dressing: ABD Pad 5x9 (in/in) 1 x Per Week/30 Days Discharge Instructions: Cover with ABD pad Compression Wrap: 3-LAYER WRAP - Profore Lite LF 3 Multilayer Compression Bandaging System 1 x Per Week/30 Days Discharge Instructions: Apply 3 multi-layer wrap as prescribed. Wound #23 - Lower Leg Wound Laterality: Left, Midline Cleanser: Byram Ancillary Kit - 15 Day Supply 3 x Per Week/30 Days Discharge Instructions: Use supplies as instructed; Kit contains: (15) Saline Bullets; (15) 3x3 Gauze; 15 pr Gloves Cleanser: Soap and Water 3 x Per Week/30 Days Discharge Instructions: Gently cleanse wound with antibacterial soap, rinse and pat dry prior to dressing wounds Prim Dressing: Silvercel 4 1/4x 4 1/4 (in/in) (DME) (Dispense As Written) 3 x Per Week/30 Days ary Discharge Instructions: Apply Silvercel 4 1/4x 4 1/4 (in/in) as instructed Secondary Dressing: (BORDER) Zetuvit Plus SILICONE BORDER Dressing 4x4 (in/in) (DME) (Generic) 3 x Per Week/30 Days Discharge Instructions: Please do not put silicone bordered dressings under wraps. Use non-bordered dressing only. Laboratory Bacteria identified in Wound by Culture (MICRO) - (ICD10 E11.621 - Type 2 diabetes  mellitus with foot ulcer) LOINC Code: 7829-5 Convenience Name: Wound culture routine Patient Medications llergies: Penicillins, Terramycin, aspirin A Notifications Medication Indication Start End 02/05/2022 Bactrim DS DOSE 1 - oral 800 mg-160 mg tablet - 1 tablet oral taken 2 times per day for 14 days Electronic Signature(s) Signed: 02/05/2022 4:16:27 PM  By: Rosalio Loud MSN RN CNS WTA Signed: 02/06/2022 2:32:55 PM By: Worthy Keeler PA-C Previous Signature: 02/05/2022 3:32:25 PM Version By: Worthy Keeler PA-C Previous Signature: 02/05/2022 3:15:21 PM Version By: Rosalio Loud MSN RN CNS WTA Entered By: Rosalio Loud on 02/05/2022 16:16:26 [name] ([id number]) [id number] Page 6 of 12 -------------------------------------------------------------------------------- Problem List Details Patient Name: Date of Service: Iva Lento 02/05/2022 1:00 PM Medical Record Number: [id number] Patient Account Number: [id number] Date of Birth/Sex: Treating RN: Jan 12, 1951 (71 y.o. [name] Primary Care Provider: [facility], [place] Other Clinician: Referring Provider: Treating Provider/Extender: Jeri Cos Self, Referral Weeks in Treatment: 0 Active Problems ICD-10 Encounter Code Description Active Date MDM Diagnosis E11.621 Type 2 diabetes mellitus with foot ulcer 02/05/2022 No Yes I87.333 Chronic venous hypertension (idiopathic) with ulcer and inflammation of 02/05/2022 No Yes bilateral lower extremity L97.812 Non-pressure chronic ulcer of other part of right lower leg with fat layer 02/05/2022 No Yes exposed L97.822 Non-pressure chronic ulcer of other part of left lower leg with fat layer exposed11/16/2023 No Yes Z79.01 Long term (current) use of anticoagulants 02/05/2022 No Yes I48.0 Paroxysmal atrial fibrillation 02/05/2022 No Yes Inactive Problems Resolved Problems Electronic Signature(s) Signed: 02/05/2022 2:15:59 PM By: Worthy Keeler  PA-C Previous Signature: 02/05/2022 2:15:11 PM Version By: Worthy Keeler PA-C Previous Signature: 02/05/2022 2:12:47 PM Version By: Worthy Keeler PA-C Entered By: Worthy Keeler on 02/05/2022 14:15:59 [name] B ([id number]) [id number] Page 7 of 12 -------------------------------------------------------------------------------- Progress Note Details Patient Name: Date of Service: Iva Lento 02/05/2022 1:00 PM Medical Record Number: [id number] Patient Account Number: [id number] Date of Birth/Sex: Treating RN: [birth year] (71 y.o. [name] Primary Care Provider: [facility], [place] [facility] Other Clinician: Referring Provider: Treating Provider/Extender: Jeri Cos Self, Referral Weeks in Treatment: 0 Subjective Chief Complaint Information obtained from Patient Bilateral Leg Ulcers History of Present Illness (HPI) 06/28/20 upon evaluation today patient appears to be doing unfortunately somewhat poorly in regard to wounds that she has over her lower extremities. She has a wound on the left distal second toe, left posterior heel, and right anterior lower leg. With that being said all in all none of the wounds appear to be too bad the one that hurts her the most is actually the wound on the posterior heel which actually is also one of the smallest. Nonetheless I think there is some callus hiding what is really going on in this area. Fortunately there does not appear to be any obvious evidence of infection which is great news. Patient does have a history of diabetes mellitus type 2, chronic venous insufficiency, hypertension, history of DVT and is on long-term anticoagulant therapy, Eliquis. s, 07/05/20 upon evaluation today patient appears to be doing well currently with regard to her wounds. I feel like she is making progress which is great news and overall there is no signs of active infection at this time. No fevers, chills, nausea, vomiting, or  diarrhea. 4/29; patient has a only a small area remaining on the tip of her left second toe and a small area remaining on the left heel. The area on the right anterior lower leg is healed. They tell me that she had remote podiatric surgery on the toes of the left foot however they are very fixed in position and I wonder whether they are making it difficult to relieve pressure in her foot wear. She is a type II diabetic with neuropathy as well. 08/01/2020 upon  evaluation today patient appears to be doing well currently in regard to her wounds. In fact everything appears to be doing much better. The posterior aspect of her heel is actually giving her some trouble not the Achilles area but a small wound that we did not even have marked. In fact I had noted this before. Nonetheless we are going to addressed that today. 5/27; most of the patient's wounds are healed including the left Achilles heel left toe. Right forearm is also healed. She has a new abrasion posteriorly in the right elbow area just above the olecranon this happened today with an abrasion from her car door 6/15; the patient's wounds on the left Achilles and left second toe remain healed. The area on the right forearm is also just about healed in fact the skin I replaced last time is looking viable which is good. She had me look at the left heel again which is not in the area of the wound more towards the tip of the heel at the Achilles insertion site as well as the tip of the left foot second toe. In the Achilles area that is clearly is friction probably in bed at night. Not really near where the wound was. The left second toe remains healed although this is hammered and overhanging first toe also puts pressure in this area 09/13/2020 upon evaluation today patient appears to be doing well with regard to all previous wounds that she had. Everything is completely healed. With that being said I do think that the patient unfortunately has a new  skin tear on the right forearm which is due to her put a Band-Aid on it and then when it pulled off this caused some issues with skin tearing. Nonetheless I think that this needs to be addressed today. Hopefully will take too long to get this to heal. She is having some issues with pain in her heel but I think this is more related to be honest to her neuropathy as opposed to anything else. Readmission: 10/30/2020 upon evaluation today patient appears to be doing poorly in regard to her legs bilaterally. She is also having an issue with her fourth toe on the left. Fortunately there does not appear to be any signs of active infection at this time. No fevers, chills, nausea, vomiting, or diarrhea. It is actually been quite a while since we have seen her. In fact June 24 was the last time that she was here in the clinic. She tells me she did not come back because things were just doing "pretty well". 11/29/2020 upon evaluation today patient actually appears to be doing well with regard to her arms those are completely healed. Her right leg is still open but doing okay. The fourth toe of the left foot does show some signs here of having some issues with necrotic tissue of an open wound. This is where a toenail was removed by podiatry. Subsequently I do think that we need to go ahead and see what we can do about getting this cleared away so being try to get some healing going forward. 12/13/2020 upon evaluation today patient appears to be doing decently well in regard to her wound on the right leg. In general I think that she is making good progress here. Unfortunately she continues to have significant issues with swelling in the left leg is now even more swollen at this point. For that reason I do believe she would be benefited by wrapping both sides although she is  not interested in doing this. Overall I think that she is definitely making some good progress here nonetheless in regard to the right leg left  leg leave some to be desired. 12/27/2020 upon evaluation today patient appears to be doing well with regard to her wound in regard to the legs. Unfortunately the toe was not doing nearly as well. I am much more concerned about infection and osteomyelitis here. She had the MRI but right now were not seeing obvious evidence of redness spreading up to her foot this is good news I do not have the result of the MRI as of yet. I am good to place her on doxycycline however due to the fact that the wound does seem to be getting a little worse in regard to her toe. 01/14/2021 upon evaluation today patient appears to be doing well with regard to her ulcers. The right leg is doing well and even the toe seems to be doing well though she still has some evidence of bone exposed on the toe which does not appear to be doing quite as good as I would like to see just and the fact that is still not covered over new tissue but looks tremendously better compared to 2 weeks ago. Overall very pleased in that regard. 01/28/2021 upon evaluation today patient's leg on the left actually appears to have new wounds this is due to her deciding to shave yesterday. Fortunately there does not appear to be any signs of infection currently. This is good news. Nonetheless I do believe that with these new areas open it would be best to wrap her leg to try to make sure we get this under control sooner rather than later. With regard to her toe there still does appear to be some evidence of bone exposed this was covered over with callus I did remove that today. Nonetheless I think this is significantly smaller than previous. CHESTINE, BELKNAP ([id number]) [id number] Page 8 of 12 02/11/2021 upon evaluation today patient appears to be doing okay in regard to her leg ulcers. I do not see anything obvious at this point this seems to be a complicating factor. I think that she is getting better. Were wrap of the right leg  not the left leg. With that being said the left fourth toe ulcer still does have evidence of being open there is some drainage still on top of the wound I think we can to try to see about using something like a collagen dressing today to see if that would be of benefit for her. She is in agreement with giving this a trial. Nonetheless I am concerned about the fact that she continues to have issues here with this not healing it is confirmed chronic osteomyelitis she has been on antibiotics. I am going to extend those today as well. That is Keflex and that will be for 1 additional month. Nonetheless if she is not doing significantly better by the next time I see her I think that the biggest thing is going to be for [place] to go ahead and see what we can do about getting her started with hyperbaric oxygen therapy to try to save the toe. 02/20/2021 upon evaluation today patient presents for follow-up she actually came in a little bit early as the home health nurse was concerned about her toe becoming "gangrenous". With that being said the good news is there is A[place] no evidence of this and in fact the toe seems to be doing better at  this point. What was over the toe was an area that had bled significantly and then subsequently dried making it look much worse than where things actually stand. There is just a very small opening remaining in the toe. In regard to her legs everything appears to be healed bilaterally. 02/24/2021 upon evaluation today patient appears to be doing well with regard to her toe ulcer. I am actually very pleased with where we stand and I think that she is making good progress at this time. There is still a very small opening although I think the biggest issue is the collagen seems to be getting stuck and drying up because the wound is so small we probably just need to use something to keep this moist like a little piece of Xeroform gauze and see if we get this to close. 03/07/2021 upon  evaluation patient actually appears to be making excellent progress in regard to her last wound on the toe. There is just a very tiny potential area that I can even be sure at first until I get some of the dry skin off when I did get this removed there was just a very tiny pinpoint opening that was barely even moist. Actually feel like this is very close to complete resolution. She has been using the Xeroform that is doing great. 03/25/2021 upon evaluation today patient appears to be doing well with regard to her toe ulcer. In fact this appears to be potentially completely healed. Fortunately I do not see anything draining at this point which is great news. She does have a small blood blister that is already drying up and looking okay on the third toe as well I am not sure what happened here she has an either. Otherwise she is having some weeping from the right leg this again is newer she tells me from last week weighing in that her pulmonologist and then today weighing in at the hematologist that she gained 30 pounds. This is quite significant if indeed true and I think she is to contact her primary care provider ASAP. This would account for why her right leg is weeping as well. 04/04/2020 upon evaluation today patient appears to be doing excellent in regard to her toe which I actually think is healed today. With that being said she unfortunately is having issues with her leg swelling and again several blisters on each leg that have reopened unfortunately. 05/02/2021 upon evaluation today patient appears to be doing well with regard to her wounds. In fact is down just 1 on the anterior portion of her right leg which is actually from a blister. Otherwise she is doing awesome. 05/15/2021 upon evaluation today patient appears to be doing better in regard to her leg ulcer although she has a small irritated area from tape on the leg medially. She also has a skin tear to her right elbow region. Fortunately there  does not appear to be any signs of active infection locally nor systemically at this point which is great news. No fevers, chills, nausea, vomiting, or diarrhea. 05/26/2021 upon evaluation today patient appears to be doing better in regard to the actual wounds although she is having a lot of leaking from the right leg. She is also been using her lymphedema pumps along with wearing her compression. Nonetheless she is having a lot of trouble with her breathing I am concerned that the pumps may be contributing to this. 06/12/2021 upon evaluation today patient appears to be doing better in regard to  a lot of things including her breathing as well as her legs. She still has a lot of swelling but the good news is this is better she lost about 20 pounds of fluid when she went to the hospital and they diuresed her. Subsequently this is good news. Nonetheless she actually told me that "if she ever gets that bad again for me to yell at her to go to the hospital". She said that I was not quite forceful enough and telling her what she needed to do. She also notes she will try to be more receptive in the future. 06/19/2021 upon evaluation today patient appears to be doing well with regard to her wound. She is tolerating the dressing changes without complication. Fortunately her legs on the right are completely healed. She did have an area that she hit her Achilles on the left but this too seems to be pretty much closed at this point. Readmission: 02-05-2022 upon evaluation today patient appears to be doing well currently in regard to her left leg although her right leg is actually giving her quite a bit more trouble at this point. Fortunately there does not appear to be any signs of infection locally or systemically which is great news. No fevers, chills, nausea, vomiting, or diarrhea. Patient tells me that she began having issues more recently with her legs in the right leg is definitely worse than the left as far as  the way she feels as well and she states it was around September 16 that she first noted this and then when she called it took about 2-1/2 weeks to get in to see me. Her past medical history really has not changed since I saw her back in March everything is pretty much about the same repeat ABIs appear to be doing well currently at 1.1 on the left and 1.12 on the right. Patient History Information obtained from Patient. Allergies Penicillins (Severity: Severe, Reaction: hives), Terramycin (Severity: Severe, Reaction: hives), aspirin (Reaction: hives) Family History Cancer - Father, Diabetes - Paternal Grandparents,Father,Siblings, Heart Disease - Maternal Grandparents, Hypertension - Maternal Grandparents,Father, No family history of Kidney Disease, Lung Disease, Seizures, Stroke, Thyroid Problems. Social History Never smoker, Marital Status - Married, Alcohol Use - Never, Drug Use - No History, Caffeine Use - Rarely. Medical History Respiratory Patient has history of Asthma, Pneumothorax Cardiovascular Patient has history of Arrhythmia, Hypertension Endocrine Patient has history of Type II Diabetes Musculoskeletal Patient has history of Gout, Osteoarthritis Neurologic Patient has history of Neuropathy Oncologic Denies history of Received Chemotherapy, Received Radiation Hospitalization/Surgery History - sleeve gastrectomy. KENNDRA, MORRIS ([id number]) [id number] Page 9 of 12 Medical A Surgical History Notes nd Constitutional Symptoms (General Health) Sleeve gastrectomy Gastrointestinal gastric bypass in 11/2012 Objective Constitutional patient is hypertensive.. pulse regular and within target range for patient.Marland Kitchen respirations regular, non-labored and within target range for patient.Marland Kitchen temperature within target range for patient.. Obese and well-hydrated in no acute distress. Vitals Time Taken: 1:28 PM, Height: 66 in, Source: Stated, Weight: 263 lbs,  Source: Stated, BMI: 42.4, Temperature: 98.1 F, Pulse: 85 bpm, Respiratory Rate: 16 breaths/min, Blood Pressure: 145/81 mmHg. Eyes conjunctiva clear no eyelid edema noted. pupils equal round and reactive to light and accommodation. Ears, Nose, Mouth, and Throat no gross abnormality of ear auricles or external auditory canals. normal hearing noted during conversation. mucus membranes moist. Respiratory normal breathing without difficulty. Cardiovascular 2+ dorsalis pedis/posterior tibialis pulses. 1+ pitting edema of the bilateral lower extremities. Musculoskeletal normal gait and posture. no significant  deformity or arthritic changes, no loss or range of motion, no clubbing. Psychiatric this patient is able to make decisions and demonstrates good insight into disease process. Alert and Oriented x 3. pleasant and cooperative. General Notes: Patient's wounds currently on the left leg are very superficial and actually appear to be almost completely healed which is great news. The right leg is a completely different story there appears to be cellulitis and I think that this is a bit more of an issue here. Fortunately I do not see any signs of infection systemically which is good news though I do believe that she needs to have a compression wrap on the right leg I do not think that she is can be able to use her stockings very well at all at this point. She voiced understanding and is in agreement with that plan. Integumentary (Hair, Skin) Wound #22 status is Open. Original cause of wound was Gradually Appeared. The date acquired was: 12/06/2021. The wound is located on the Right,Lateral,Anterior,Circumferential Lower Leg. The wound measures 15.8cm length x 12cm width x 0.1cm depth; 148.911cm^2 area and 14.891cm^3 volume. There is Fat Layer (Subcutaneous Tissue) exposed. There is small (1-33%) red, pink granulation within the wound bed. Wound #23 status is Open. Original cause of wound was Gradually  Appeared. The date acquired was: 12/06/2021. The wound is located on the Left,Midline Lower Leg. The wound measures 0.4cm length x 0.5cm width x 0.1cm depth; 0.157cm^2 area and 0.016cm^3 volume. Assessment Active Problems ICD-10 Type 2 diabetes mellitus with foot ulcer Chronic venous hypertension (idiopathic) with ulcer and inflammation of bilateral lower extremity Non-pressure chronic ulcer of other part of right lower leg with fat layer exposed Non-pressure chronic ulcer of other part of left lower leg with fat layer exposed Long term (current) use of anticoagulants Paroxysmal atrial fibrillation Procedures Wound #22 Pre-procedure diagnosis of Wound #22 is a Diabetic Wound/Ulcer of the Lower Extremity located on the Right,Lateral,Anterior,Circumferential Lower Leg .Severity of Tissue Pre Debridement is: Fat layer exposed. There was a Chemical/Enzymatic/Mechanical debridement performed by Tommie Sams., PA-C. With the following instrument(s): saline gauze. Other agent used was Saline gauze. A Minimum amount of bleeding was controlled with Pressure. The procedure was tolerated well with a pain level of 2 throughout and a pain level of 4 following the procedure. Post Debridement Measurements: 15.8cm length x 12cm width x 0.1cm depth; 14.891cm^3 volume. Character of Wound/Ulcer Post Debridement is stable. Severity of Tissue Post Debridement is: Fat layer exposed. BLANCHE, SCOVELL ([id number]) [id number] Page 10 of 12 Post procedure Diagnosis Wound #22: Same as Pre-Procedure Pre-procedure diagnosis of Wound #22 is a Diabetic Wound/Ulcer of the Lower Extremity located on the Right,Lateral,Anterior,Circumferential Lower Leg . There was a Three Layer Compression Therapy Procedure by Rosalio Loud, RN. Post procedure Diagnosis Wound #22: Same as Pre-Procedure Plan Follow-up Appointments: Return Appointment in: - 4 days Bathing/ Shower/ Hygiene: Wound #22  Right,Lateral,Anterior,Circumferential Lower Leg: Wash wounds with antibacterial soap and water. - Left leg May shower with wound dressing protected with water repellent cover or cast protector. - Left leg-change dressing Saturday The following medication(s) was prescribed: Bactrim DS oral 800 mg-160 mg tablet 1 1 tablet oral taken 2 times per day for 14 days starting 02/05/2022 WOUND #22: - Lower Leg Wound Laterality: Right, Lateral, Anterior, Circumferential Cleanser: Soap and Water 1 x Per Week/30 Days Discharge Instructions: Gently cleanse wound with antibacterial soap, rinse and pat dry prior to dressing wounds Prim Dressing: Silvercel 4 1/4x 4 1/4 (in/in) 1 x  Per Week/30 Days ary Discharge Instructions: Apply Silvercel 4 1/4x 4 1/4 (in/in) as instructed Secondary Dressing: ABD Pad 5x9 (in/in) 1 x Per Week/30 Days Discharge Instructions: Cover with ABD pad Com pression Wrap: 3-LAYER WRAP - Profore Lite LF 3 Multilayer Compression Bandaging System 1 x Per Week/30 Days Discharge Instructions: Apply 3 multi-layer wrap as prescribed. WOUND #23: - Lower Leg Wound Laterality: Left, Midline Cleanser: Normal Saline 3 x Per Week/30 Days Discharge Instructions: Wash your hands with soap and water. Remove old dressing, discard into plastic bag and place into trash. Cleanse the wound with Normal Saline prior to applying a clean dressing using gauze sponges, not tissues or cotton balls. Do not scrub or use excessive force. Pat dry using gauze sponges, not tissue or cotton balls. Prim Dressing: Silvercel 4 1/4x 4 1/4 (in/in) 3 x Per Week/30 Days ary Discharge Instructions: Apply Silvercel 4 1/4x 4 1/4 (in/in) as instructed Secondary Dressing: (BORDER) Zetuvit Plus SILICONE BORDER Dressing 4x4 (in/in) 3 x Per Week/30 Days Discharge Instructions: Please do not put silicone bordered dressings under wraps. Use non-bordered dressing only. 1. I am good recommend based on what we are seeing currently that we  initiate treatment with silver alginate dressing I think that is going to do well at both sites currently. 2. I am also going to recommend that we have the patient continue to monitor for any signs of infection. Obviously if anything changes she knows she can contact the office and let me know. 3. I would also recommend that we have the patient continue to use her compression sock on the left leg again we will get a be wrapping the right leg with a 3 layer compression wrap. On the right leg I did not feel like she can use her own compression stockings at this point. For the left leg though I think she will be okay to do this due to the small and insignificant size of the wounds compared to what is on the right. She is in agreement with this. We will see patient back for reevaluation in 1 week here in the clinic. If anything worsens or changes patient will contact our office for additional recommendations. Electronic Signature(s) Signed: 02/05/2022 3:33:06 PM By: Worthy Keeler PA-C Previous Signature: 02/05/2022 3:01:05 PM Version By: Worthy Keeler PA-C Entered By: Worthy Keeler on 02/05/2022 15:33:05 -------------------------------------------------------------------------------- ROS/PFSH Details Patient Name: Date of Service: [name], NA NCY B. 02/05/2022 1:00 PM Medical Record Number: [id number] Patient Account Number: [id number] Date of Birth/Sex: Treating RN: Jan 27, 1951 (71 y.o. [name] Primary Care Provider: [facility], [place] [facility] Other Clinician: Referring Provider: Treating Provider/Extender: Jeri Cos Self, Referral Weeks in TreatmentJOHNNA, BOLLIER B ([id number]) [id number] Page 11 of 12 Information Obtained From Patient Constitutional Symptoms (General Health) Medical History: Past Medical History Notes: Sleeve gastrectomy Respiratory Medical History: Positive for: Asthma; Pneumothorax Cardiovascular Medical History: Positive for:  Arrhythmia; Hypertension Gastrointestinal Medical History: Past Medical History Notes: gastric bypass in 11/2012 Endocrine Medical History: Positive for: Type II Diabetes Time with diabetes: 20 years Treated with: Oral agents, Diet Blood sugar tested every day: Yes Tested : Blood sugar testing results: Breakfast: 122; Bedtime: 146 Musculoskeletal Medical History: Positive for: Gout; Osteoarthritis Neurologic Medical History: Positive for: Neuropathy Oncologic Medical History: Negative for: Received Chemotherapy; Received Radiation Immunizations Pneumococcal Vaccine: Received Pneumococcal Vaccination: Yes Received Pneumococcal Vaccination On or After 60th Birthday: Yes Implantable Devices None Hospitalization / Surgery History Type of Hospitalization/Surgery sleeve gastrectomy Family and Social History  Cancer: Yes - Father; Diabetes: Yes - Paternal Grandparents,Father,Siblings; Heart Disease: Yes - Maternal Grandparents; Hypertension: Yes - Maternal Grandparents,Father; Kidney Disease: No; Lung Disease: No; Seizures: No; Stroke: No; Thyroid Problems: No; Never smoker; Marital Status - Married; Alcohol Use: Never; Drug Use: No History; Caffeine Use: Rarely; Financial Concerns: No; Food, Clothing or Shelter Needs: No; Support System Lacking: No; Transportation Concerns: No Engineer, maintenance) Signed: 02/05/2022 4:56:08 PM By: Rosalio Loud MSN RN CNS WTA Signed: 02/06/2022 2:32:55 PM By: Worthy Keeler PA-C Entered By: Rosalio Loud on 02/05/2022 [place], Nadira B ([id number]) [id number] Page 12 of 12 -------------------------------------------------------------------------------- SuperBill Details Patient Name: Date of Service: Iva Lento 02/05/2022 Medical Record Number: [id number] Patient Account Number: [id number] Date of Birth/Sex: Treating RN: Apr 13, 1950 (71 y.o. 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.822 Non-pressure chronic ulcer of other part of left lower leg with fat layer exposed Z79.01 Long term (current) use of anticoagulants I48.0 Paroxysmal atrial fibrillation Facility Procedures : CPT4 Code: 78295621 Description: [place] VISIT-LEV 4 EST PT Modifier: Quantity: 1 : CPT4 Code: 30865784 Description: 69629 - [place] W/O ANES NON SELECT Modifier: Quantity: 1 Physician Procedures : CPT4 Code Description Modifier [id number] - WC PHYS LEVEL 4 - EST PT ICD-10 Diagnosis Description E11.621 Type 2 diabetes mellitus with foot ulcer I87.333 Chronic venous hypertension (idiopathic) with ulcer and inflammation of bilateral lower  extremity L97.812 Non-pressure chronic ulcer of other part of right lower leg with fat layer exposed L97.822 Non-pressure chronic ulcer of other part of left lower leg with fat layer exposed Quantity: 1 Electronic Signature(s) Signed: 02/05/2022 4:19:00 PM By: Rosalio Loud MSN RN CNS WTA Signed: 02/06/2022 2:32:55 PM By: Worthy Keeler PA-C Previous Signature: 02/05/2022 3:33:22 PM Version By: Worthy Keeler PA-C Entered By: Rosalio Loud on 02/05/2022 16:18:59

## 2022-02-06 NOTE — Patient Instructions (Signed)
Medication Instructions:  No changes at this time.   *If you need a refill on your cardiac medications before your next appointment, please call your pharmacy*   Lab Work: [facility] today over at the [facility]. Stop at registration desk.   If you have labs (blood work) drawn today and your tests are completely normal, you will receive your results only by: [place] (if you have MyChart) OR A paper copy in the mail If you have any lab test that is abnormal or we need to change your treatment, we will call you to review the results.   Testing/Procedures: None   Follow-Up: At [facility], you and your health needs are our priority.  As part of our continuing mission to provide you with exceptional heart care, we have created designated Provider Care Teams.  These Care Teams include your primary Cardiologist (physician) and Advanced Practice Providers (APPs -  Physician Assistants and Nurse Practitioners) who all work together to provide you with the care you need, when you need it.    Your next appointment:   1 month(s)  The format for your next appointment:   In Person  Provider:   Kathlyn [place], MD or Murray Hodgkins, NP     Important Information About Sugar

## 2022-02-06 NOTE — Telephone Encounter (Signed)
Patient states she took Ozempic yesterday and her fasting blood sugar when she first wakes up in the morning has been around the 170's for the last few days.  Patient states she needs a refill for her Ozempic, and would like for Dr. Carollee Leitz to see if she would like to adjust the dose given her blood sugar readings.  *Patient states her preferred pharmacy is [facility].  Patient states she has an infection in her leg and she is on antibiotics and she goes back to the [facility] [place] on Tuesday (02/10/2022).

## 2022-02-06 NOTE — Progress Notes (Signed)
Office Visit    Patient Name: [name] Date of Encounter: 02/06/2022  Primary Care Provider:  Carollee Leitz, MD Primary Cardiologist:  Kathlyn Sacramento, MD  Chief Complaint    71 year old female with a history of HFpEF, paroxysmal atrial fibrillation, prior strokes, hypertension, hyperlipidemia, diabetes, stage III chronic kidney disease, obesity, chronic pain, DVT, and sleep apnea on CPAP, who presents for follow-up of HFpEF and volume overload.  Past Medical History    Past Medical History:  Diagnosis Date   Abnormal antibody titer    Anxiety and depression    Arthritis    Asthma    Atypical chest pain    a. 03/2018 MV: No ischemia/infarct. EF >65%.   Carotid arterial disease ([place])    a. 05/5699 U/S: RICA <77, LICA nl.   Chronic heart failure with preserved ejection fraction (HFpEF) ([place])    a. 08/2013 Echo: EF 60-65%; b. 08/2019 Echo: EF 60-65%; c. 03/2020 Echo: EF 60-65%; d. 05/2021 Echo: EF 55-60%, no rmwa, nl RV fxn, RVSP 54.96mHg. Mildly dil LA, mild MR, mod TR.   CKD (chronic kidney disease), stage III (HCC)    Cystocele    Depression    Diabetes ([place]    DVT (deep venous thrombosis) (HCC)    Righ calf   Dyspnea    11/08/2018 - "more now due to lack of exercise"   Edema    GERD (gastroesophageal reflux disease)    Heart murmur    History of IBS    History of kidney stones    History of methicillin resistant staphylococcus aureus (MRSA) 2008   History of multiple strokes    Hyperlipidemia    Hypertension    Hypothyroidism    Insomnia    Kidney stone    kidney stones with lithotripsy .  CPanolaKidney- "adrenal glands"   Mitral regurgitation    a. 05/2021 Echo: Mild MR.   Neuropathy involving both lower extremities    Non-healing ulcer of left foot (HCC)    Obesity, Class II, BMI 35-39.9, with comorbidity    PAF (paroxysmal atrial fibrillation) ([facility]    a. 07/2013 Event monitor: Freq PVCs and 3 short runs of Afib-->CHA2DS2VASc = 7-->Eliquis.   PAH  (pulmonary artery hypertension) ([facility]    a. 05/2021 Echo: RVSP 54.443mg.   Peripheral vascular disease ([facility]   a. 03/2018 LE Angio: Nl renal arteries. Nl aorta & iliacs. Nl LLE arterial flow; b. 06/2020 ABI's: Nl bilat.   Sleep apnea    use C-PAP   Stroke ([facility] [place]   03/2020   Tricuspid regurgitation    a. 05/2021 Echo: Mod TR (in setting of RVSP 54.30m10m).   Urinary incontinence    Venous stasis dermatitis of both lower extremities    Past Surgical History:  Procedure Laterality Date   ANKLE SURGERY Right    APPLICATION OF WOUND VAC Left 06/27/2015   Procedure: APPLICATION OF WOUND VAC ( POSSIBLE ) ;  Surgeon: JasAlgernon HuxleyD;  Location: [facility] ORS;  Service: Vascular;  Laterality: Left;   APPLICATION OF WOUND VAC Left 09/25/2016   Procedure: APPLICATION OF WOUND VAC;  Surgeon: TroAlbertine PatriciaPM;  Location: [facility];  Service: Podiatry;  Laterality: Left;   arm surgery     right     CHOLECYSTECTOMY     COLONOSCOPY     COLONOSCOPY WITH PROPOFOL N/A 01/11/2017   Procedure: COLONOSCOPY WITH PROPOFOL;  Surgeon: SkuLollie SailsD;  [facility];  Service: Endoscopy;  Laterality: N/A;   COLONOSCOPY WITH PROPOFOL N/A 02/22/2017   Procedure: COLONOSCOPY WITH PROPOFOL;  Surgeon: [name], MD;  Location: [facility]COPY;  Service: Endoscopy;  Laterality: N/A;   COLONOSCOPY WITH PROPOFOL N/A 05/31/2017   Procedure: COLONOSCOPY WITH PROPOFOL;  Surgeon: [name], MD;  Location: [facility] ENDOSCOPY;  Service: Endoscopy;  Laterality: N/A;   COLONOSCOPY WITH PROPOFOL N/A 01/26/2022   Procedure: COLONOSCOPY WITH PROPOFOL;  Surgeon: [name], MD;  Location: [facility] ENDOSCOPY;  Service: Endoscopy;  Laterality: N/A;   ESOPHAGOGASTRODUODENOSCOPY (EGD) WITH PROPOFOL N/A 01/11/2017   Procedure: ESOPHAGOGASTRODUODENOSCOPY (EGD) WITH PROPOFOL;  Surgeon: [name], MD;  Location: [facility] ENDOSCOPY;  Service: Endoscopy;  Laterality: N/A;   ESOPHAGOGASTRODUODENOSCOPY (EGD)  WITH PROPOFOL N/A 10/25/2018   Procedure: ESOPHAGOGASTRODUODENOSCOPY (EGD) WITH PROPOFOL;  Surgeon: [name], MD;  Location: [facility] ENDOSCOPY;  Service: Endoscopy;  Laterality: N/A;   ESOPHAGOGASTRODUODENOSCOPY (EGD) WITH PROPOFOL N/A 11/29/2019   Procedure: ESOPHAGOGASTRODUODENOSCOPY (EGD) WITH PROPOFOL;  Surgeon: Toledo, Benay Pike, MD;  Location: [facility] ENDOSCOPY;  Service: Gastroenterology;  Laterality: N/A;   ESOPHAGOGASTRODUODENOSCOPY (EGD) WITH PROPOFOL N/A 01/26/2022   Procedure: ESOPHAGOGASTRODUODENOSCOPY (EGD) WITH PROPOFOL;  Surgeon: [name], MD;  Location: [facility] ENDOSCOPY;  Service: Endoscopy;  Laterality: N/A;  DM   HERNIA REPAIR     umbilical   HIATAL HERNIA REPAIR     I & D EXTREMITY Left 06/27/2015   Procedure: IRRIGATION AND DEBRIDEMENT EXTREMITY            ( CALF HEMATOMA ) POSSIBLE WOUND VAC;  Surgeon: [name], MD;  Location: [facility] ORS;  Service: Vascular;  Laterality: Left;   INCISION AND DRAINAGE OF WOUND Left 09/25/2016   Procedure: IRRIGATION AND DEBRIDEMENT - PARTIAL RESECTION OF ACHILLES TENDON WITH WOUND VAC APPLICATION;  Surgeon: [name], DPM;  Location: [facility] OR[facility];  Service: Podiatry;  Laterality: Left;   INCISION AND DRAINAGE OF WOUND Left 11/09/2018   Procedure: Excision of left foot wound with ACell placement;  Surgeon: Wallace Going, DO;  Location: [place];  Service: Plastics;  Laterality: Left;   IRRIGATION AND DEBRIDEMENT ABSCESS Left 09/07/2016   Procedure: IRRIGATION AND DEBRIDEMENT ABSCESS LEFT HEEL;  Surgeon: [name], DPM;  Location: [facility];  Service: Podiatry;  Laterality: Left;   JOINT REPLACEMENT  2013   left knee replacement   KIDNEY SURGERY Right    kidney stones   LITHOTRIPSY     LOWER EXTREMITY ANGIOGRAPHY Left 04/04/2018   Procedure: LOWER EXTREMITY ANGIOGRAPHY;  Surgeon: [name], MD;  Location: [place] CV LAB;  Service: Cardiovascular;  Laterality: Left;   NM GATED MYOVIEW [facility] HX)  February 2017    Likely breast attenuation. LOW RISK study. Normal EF 55-60%.   OTHER SURGICAL HISTORY     08/2016 or 09/2016 surgery on achilles tenso h/o staph infection heal. Dr. [name]   removal of left hematoma Left    leg   REVERSE SHOULDER ARTHROPLASTY Right 10/08/2015   Procedure: REVERSE SHOULDER ARTHROPLASTY;  Surgeon: [name], MD;  Location: [facility] ORS;  Service: Orthopedics;  Laterality: Right;   SLEEVE GASTROPLASTY     TONSILLECTOMY     TOTAL KNEE ARTHROPLASTY Left    TOTAL SHOULDER REPLACEMENT Right    TRANSESOPHAGEAL ECHOCARDIOGRAM  08/08/2013   Mild LVH, EF 60-65%. Moderate LA dilation and mild RA dilation. Mild MR with no evidence of stenosis and no evidence of endocarditis. A false chordae is noted.   TRANSTHORACIC ECHOCARDIOGRAM  08/03/2013   Mild-Moderate  concentric LVH, EF 60-65%. Normal diastolic function. Mild LA dilation. Mild MR with possible vegetation  - not confirmed on TEE    ULNAR NERVE TRANSPOSITION Right 08/06/2016   Procedure: ULNAR NERVE DECOMPRESSION/TRANSPOSITION;  Surgeon: [name], MD;  Location: [facility] ORS;  Service: Orthopedics;  Laterality: Right;   UPPER GI ENDOSCOPY     VAGINAL HYSTERECTOMY      Allergies  Allergies  Allergen Reactions   Morphine And Related Other (See Comments) and Nausea Only    Patient becomes very confused Other reaction(s): Other (See Comments), Other (See Comments) Patient becomes very confused Patient becomes very confused    Penicillins Hives, Shortness Of Breath, Swelling and Other (See Comments)    Facial swelling Has patient had a PCN reaction causing immediate rash, facial/tongue/throat swelling, SOB or lightheadedness with hypotension: Yes Has patient had a PCN reaction causing severe rash involving mucus membranes or skin necrosis: Yes Has patient had a PCN reaction that required hospitalization No Has patient had a PCN reaction occurring within the last 10 years: No If all of the above answers are "NO", then may proceed  with Cephalosporin use.  Other reaction(s): Other (See Comments), Other (See Comments) Facial swelling Has patient had a PCN reaction causing immediate rash, facial/tongue/throat swelling, SOB or lightheadedness with hypotension: Yes Has patient had a PCN reaction causing severe rash involving mucus membranes or skin necrosis: Yes Has patient had a PCN reaction that required hospitalization No Has patient had a PCN reaction occurring within the last 10 years: No If all of the above answers are "NO", then may proceed with Cephalosporin use. Facial swelling Has patient had a PCN reaction causing immediate rash, facial/tongue/throat swelling, SOB or lightheadedness with hypotension: Yes Has patient had a PCN reaction causing severe rash involving mucus membranes or skin necrosis: Yes Has patient had a PCN reaction that required hospitalization No Has patient had a PCN reaction occurring within the last 10 years: No If all of the above answers are "NO", then may proceed with Cephalosporin use. Facial swelling Has patient had a PCN reaction causing immediate rash, facial/tongue/throat swelling, SOB or lightheadedness with hypotension: Yes Has patient had a PCN reaction causing severe rash involving mucus membranes or skin necrosis: Yes Has patient had a PCN reaction that required hospitalization No Has patient had a PCN reaction occurring within the last 10 years: No If all of the above answers are "NO", then may proceed with Cephalosporin use.    Ambien [Zolpidem]     Hallucination    Aspirin Hives   Oxytetracycline Hives    Other reaction(s): Unknown     History of Present Illness    71 year old female with the above complex past medical history including paroxysmal atrial fibrillation, strokes, hypertension, hyperlipidemia, chronic HFpEF, diabetes, obesity, stage III chronic kidney disease, chronic pain, DVT, sleep apnea, chronic left foot ulceration followed by vascular surgery, and  obesity.  In the setting of atypical chest pain, she underwent stress testing in January 2020, which was nonischemic.  In June 2021, she was met with pneumonia, sepsis, and mild troponin elevation.  Echo showed an EF of 60 to 65% without regional wall motion abnormalities, it was felt the troponin elevation was secondary to demand ischemia.  In September 2021, she was admitted with altered mental status and left upper extremity weakness.  MRI showed bilateral, small, multiple acute strokes suggestive of embolic etiology.  She was on Pradaxa at that time and this was transitioned to Eliquis.  Unfortunately, she  suffered another stroke in November 2021, and neurology suspected recurrent stroke due to severe malnutrition given history of gastric sleeve and noncompliance with vitamins.  In January 2022, she was admitted again for altered mental status and concern for stroke, though this was ruled out.  Echo with bubble study showed normal LV function without evidence of ASD or PFO.  In the setting of progressive weight gain and edema, an echo was performed in November 2022, which showed an EF of 55 to 60% with RVSP of 54.5 mmHg, mild MR, and moderate TR.  In March 2023, she required admission secondary to lower extremity cellulitis and acute on chronic HFpEF.  She was treated with IV antibiotics and diuresed 13 pounds during admission.   Ms. Rauh was seen in cardiology clinic on November 9 with 23 pound weight gain since May 2023 in the setting of occasional noncompliance with Lasix, and dietary indiscretion/high exogenous sodium intake.  With that, she has significant lower extremity edema and dyspnea.  I increased her Lasix to 40 mg once daily and over the past week, she has been more compliant and is also discontinued salty snacks.  With this, she is down 10 pounds on our scale today.  She was seen by wound care yesterday and her legs were wrapped.  She has noted reduced edema at home as well as some improvement  in dyspnea.  She denies chest pain, palpitations, PND, orthopnea, dizziness, syncope, or early satiety.  Home Medications    Current Outpatient Medications  Medication Sig Dispense Refill   acetaminophen (TYLENOL) 325 MG tablet Take 2 tablets (650 mg total) by mouth every 6 (six) hours as needed for mild pain or moderate pain (or Fever >/= 101).     albuterol (PROVENTIL) (2.5 MG/3ML) 0.083% nebulizer solution Take 3 mLs (2.5 mg total) by nebulization every 4 (four) hours as needed for wheezing or shortness of breath. 75 mL 12   ALPRAZolam (XANAX) 0.25 MG tablet Take 1 tablet (0.25 mg total) by mouth 2 (two) times daily as needed for anxiety. See SIG change was taking 1/2 of 0.5 tablet bid prn changed to 0.25 bid prn. D/c 0.5 xanax 60 tablet 2   buPROPion (WELLBUTRIN XL) 150 MG 24 hr tablet Take 1 tablet (150 mg total) by mouth daily. 90 tablet 3   calcium-vitamin D (OSCAL WITH D) 500-200 MG-UNIT tablet Take 1 tablet by mouth 2 (two) times daily with a meal. 60 tablet 0   cephALEXin (KEFLEX) 250 MG capsule Take 1 capsule (250 mg total) by mouth daily. 30 capsule 5   cholecalciferol (VITAMIN D) 25 MCG tablet Take 1 tablet (1,000 Units total) by mouth 2 (two) times daily with a meal. 30 tablet 2   clobetasol cream (TEMOVATE) 1.61 % Apply 1 application topically 2 (two) times daily as needed (skin irritation).      colchicine 0.6 MG tablet Take 1 tablet (0.6 mg total) by mouth as needed. May repeat 1 pill in 1 hr. No more than 3 total pills 1.8 mg in 24 hours as needed gout flare 30 tablet 2   diclofenac Sodium (VOLTAREN) 1 % GEL Apply 2 g topically 3 (three) times daily. 100 g 2   DULoxetine (CYMBALTA) 60 MG capsule Take 1 capsule (60 mg total) by mouth daily. 90 capsule 3   ELIQUIS 5 MG TABS tablet TAKE 1 TABLET BY MOUTH TWICE DAILY 180 tablet 1   ergocalciferol (VITAMIN D2) 1.25 MG (50000 UT) capsule Take 50,000 Units by mouth once  a week.     estradiol (ESTRACE) 0.1 MG/GM vaginal cream Place 1  Applicatorful vaginally every Monday, Wednesday, and Friday. Apply 0.48m (pea-sized amount)  just inside the vaginal introitus with a finger-tip on Monday, Wednesday and Friday nights, 42.5 g 11   Fluticasone-Umeclidin-Vilant (TRELEGY ELLIPTA) 200-62.5-25 MCG/ACT AEPB Inhale 1 puff into the lungs daily. 60 each 0   folic acid (FOLVITE) 1 MG tablet Take 1 tablet (1 mg total) by mouth daily. 90 tablet 3   furosemide (LASIX) 40 MG tablet Take 1 tablet (40 mg total) by mouth as directed. Take 1 tablet daily. May take additional 1 tablet If weight gain of greater than 2 pounds in 24 hours or 5 pounds in one week. 90 tablet 3   GEMTESA 75 MG TABS TAKE 1 TABLET BY MOUTH DAILY 30 tablet 11   glucose blood (ONETOUCH ULTRA) test strip TEST UP TO TWICE DAILY 100 each 1   HYDROcodone-acetaminophen (NORCO/VICODIN) 5-325 MG tablet Take 1 tablet by mouth 2 (two) times daily as needed for severe pain. Must last 30 days 45 tablet 0   Lancets (ONETOUCH ULTRASOFT) lancets Use as instructed 100 each 12   levocetirizine (XYZAL) 5 MG tablet TAKE ONE TABLET BY MOUTH AT BEDTIME AS NEEDED FOR ALLERGIES 90 tablet 3   levothyroxine (SYNTHROID) 137 MCG tablet Take 1 tablet (137 mcg total) by mouth daily before breakfast. 30 minutes do not mix with vitamins or acid reflux medications D/c 150 mcg 90 tablet 3   losartan (COZAAR) 50 MG tablet Take 1 tablet (50 mg total) by mouth daily. 90 tablet 3   metoprolol succinate (TOPROL-XL) 25 MG 24 hr tablet Take 0.5 tablets (12.5 mg total) by mouth daily. 45 tablet 3   Misc. Devices (TUB TRANSFER BOARD) MISC 1 Device by Does not apply route as needed. 1 each 0   montelukast (SINGULAIR) 10 MG tablet Take 1 tablet (10 mg total) by mouth at bedtime. 90 tablet 3   Multiple Vitamin (MULTIVITAMIN WITH MINERALS) TABS tablet Take 1 tablet by mouth daily.     mupirocin ointment (BACTROBAN) 2 % APPLY TOPICALLY TWICE DAILY TO RIGHT LOWER LEG AS DIRECTED. 22 g 0   naloxone (NARCAN) nasal spray 4  mg/0.1 mL Place 1 spray into the nose as needed for up to 365 doses (for opioid-induced respiratory depresssion). In case of emergency (overdose), spray once into each nostril. If no response within 3 minutes, repeat application and call [phone] 1 each 0   nystatin cream (MYCOSTATIN) Apply 1 application. topically 2 (two) times daily. Angles of mouth or skin folds prn 30 g 11   ondansetron (ZOFRAN) 4 MG tablet Take 1 tablet (4 mg total) by mouth every 8 (eight) hours as needed for nausea or vomiting. 90 tablet 1   pantoprazole (PROTONIX) 40 MG tablet Take 1 tablet (40 mg total) by mouth daily. 30 min before food 90 tablet 3   Powders (GOLD BOND NO MESS BODY POWDER) AERP Apply 1 application. topically daily as needed. Gold bond talc free powder 200 g 11   pregabalin (LYRICA) 50 MG capsule TAKE 1 CAPSULE BY MOUTH 3 TIMES DAILY 90 capsule 2   rosuvastatin (CRESTOR) 20 MG tablet Take 1 tablet (20 mg total) by mouth daily. 90 tablet 1   Semaglutide, 1 MG/DOSE, 4 MG/3ML SOPN Inject 1 mg into the skin once a week. Start this month 3 after 0.25 weekly x 136monthnd 0.5 weekly x 1 month 3 mL 0   Semaglutide, 2 MG/DOSE, (  OZEMPIC, 2 MG/DOSE,) 8 MG/3ML SOPN Inject 2 mg into the skin once a week. Start this weekly after week 4. Start after 0.25 weekly x 1 month, then 0.5 weekly x 1 month, then 1 mg weekly x 1 month 3 mL 5   Semaglutide,0.25 or 0.5MG/DOS, 2 MG/3ML SOPN Inject 0.25 mg into the skin once a week. X 1 month then increase to 0.5 weekly x 1 month then increase to 1 mg weekly x 1 month then increase to 2 mg weekly x 1 month if tolerating. D/c metformin and Januvia 50 3 mL 1   sucralfate (CARAFATE) 1 GM/10ML suspension Take 1 g by mouth 3 (three) times daily.      sulfamethoxazole-trimethoprim (BACTRIM DS) 800-160 MG tablet Take 1 tablet by mouth 2 (two) times daily.     thiamine 100 MG tablet Take 0.5 tablets (50 mg total) by mouth 2 (two) times daily with a meal. 60 tablet 2   No current facility-administered  medications for this visit.     Review of Systems    Ongoing but improved lower extremity edema.  Improved dyspnea exertion.  She denies chest pain, palpitations, PND, orthopnea, dizziness, syncope, or early satiety.  All other systems reviewed and are otherwise negative except as noted above.    Physical Exam    VS:  BP 120/60 (BP Location: Right Arm, Patient Position: Sitting, Cuff Size: Large)   Pulse 76   Ht _0  (1.676 m)   Wt 265 lb 8 oz (120.4 kg)   SpO2 96%   BMI 42.85 kg/m  , BMI Body mass index is 42.85 kg/m.     GEN: Well nourished, well developed, in no acute distress. HEENT: normal. Neck: Supple, no JVD, carotid bruits, or masses. Cardiac: Irregularly irregular, distant, no murmurs, rubs, or gallops. No clubbing, cyanosis, 1+ bilateral lower extremity edema of the calves and distal posterior thighs.  Overall improved from last week.  The lower legs are wrapped. Radials/ 2+ and equal bilaterally.  Respiratory:  Respirations regular and unlabored, clear to auscultation bilaterally. GI: Soft, nontender, nondistended, BS + x 4. MS: no deformity or atrophy. Skin: warm and dry, no rash. Neuro:  Strength and sensation are intact. Psych: Normal affect.  Accessory Clinical Findings    ECG personally reviewed by me today -atrial fibrillation, 76- no acute changes.  Lab Results  Component Value Date   WBC 6.3 12/23/2021   HGB 12.2 12/23/2021   HCT 37.7 12/23/2021   MCV 88.7 12/23/2021   PLT 186 12/23/2021   Lab Results  Component Value Date   CREATININE 1.30 (H) 11/27/2021   BUN 27 (H) 11/27/2021   NA 139 11/27/2021   K 5.0 11/27/2021   CL 101 11/27/2021   CO2 25 11/27/2021   Lab Results  Component Value Date   ALT 15 11/27/2021   AST 16 11/27/2021   ALKPHOS 80 11/27/2021   BILITOT 0.5 11/27/2021   Lab Results  Component Value Date   CHOL 88 05/27/2021   HDL 55.50 05/27/2021   LDLCALC 18 05/27/2021   TRIG 70.0 05/27/2021   CHOLHDL 2 05/27/2021     Lab Results  Component Value Date   HGBA1C 7.7 (H) 11/27/2021    Assessment & Plan    1.  Acute on chronic HFrEF: Echo March 2023 with an EF of 55 to 60% and RVSP of 54 mmHg.  She was seen 1 week ago with 23 pound weight gain since May 2023 and significant lower extremity edema  and dyspnea.  She admitted to dietary indiscretions and also skipping Lasix about 2-3 times a week.  I increased her Lasix to 40 mg daily.  She notes improved compliance and reduction in salt intake.  Her weight is down 10 pounds on our scale today and she thinks it is more than that on her home scale.  She continues to have calf and thigh edema though this is improved, currently 1+.  Follow-up basic metabolic panel today.  If renal function stable, will we will plan to continue current dose of Lasix.  We discussed that her Lasix dose going forward is largely going to be dependent on her ability to keep excess salt out of her diet.  Recommend 1.5 g sodium diet.  2.  Essential hypertension: Stable.  3.  Paroxysmal atrial fibrillation: As was noted last week, she remains in rate controlled atrial fibrillation at 76 bpm.  She is on beta-blocker and Eliquis therapy.  She is not symptomatic and in the setting of dietary indiscretion and noncompliance, its unclear to what extent paroxysmal atrial fibrillation might be playing a role in her volume excess recently.  Continue rate control as I suspect to be very difficult to keep her in sinus rhythm in the long-term related to body habitus and help pulmonary hypertension.  4.  Stage III chronic kidney disease: Follow-up based metabolic panel today.  5.  Type 2 diabetes mellitus: A1c 7.7 in September.  Followed by primary care.  Poor candidate for SGLT2 inhibitor secondary to UTIs.  She is on Ozempic.  6.  Obstructive sleep apnea: Compliant with CPAP.  7.  Dispo: Follow-up basic metabolic panel today.  Follow-up in 1 month or sooner if necessary.   Murray Hodgkins,  NP 02/06/2022, 4:26 PM

## 2022-02-08 LAB — AEROBIC CULTURE W GRAM STAIN (SUPERFICIAL SPECIMEN)

## 2022-02-08 IMAGING — US US EXTREM LOW VENOUS*R*
1 series · 14 of 24 positions shown · non-contrast
Comparison: September 30, 2016.

CLINICAL DATA: Acute right lower extremity pain.

EXAM:
Right LOWER EXTREMITY VENOUS DOPPLER ULTRASOUND
TECHNIQUE: Gray-scale sonography with compression, as well as color and duplex
ultrasound, were performed to evaluate the deep venous system(s)
from the level of the common femoral vein through the popliteal and
proximal calf veins.

[Series 1: us venous img lower uni right (dvt) · portal-venous · 14 of 38 slices shown]
[im 1/38]
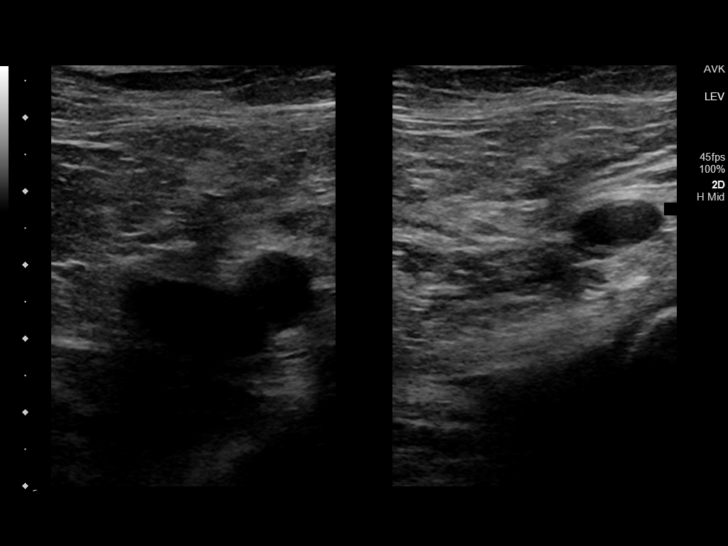
[im 4/38]
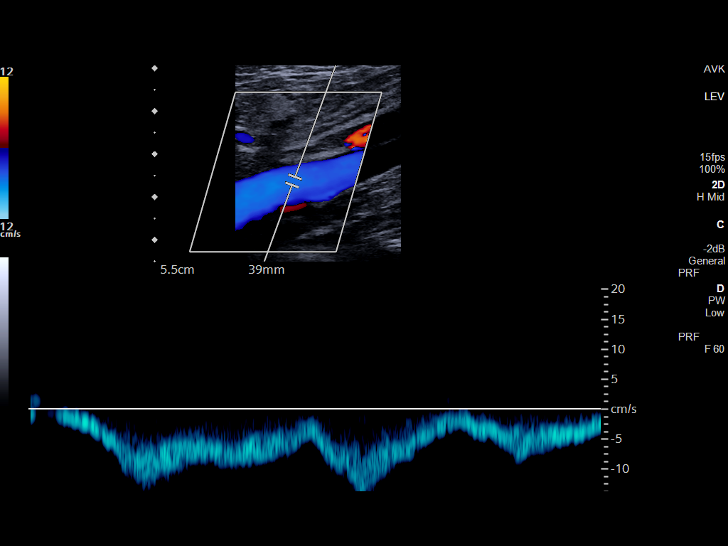
[im 7/38]
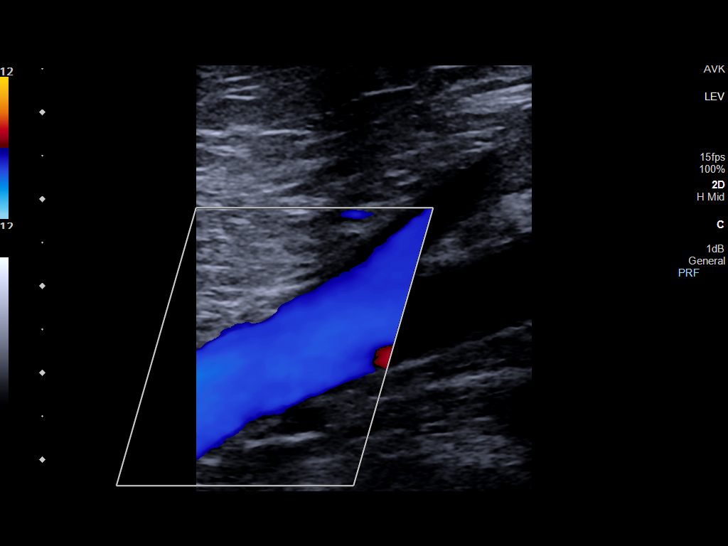
[im 10/38]
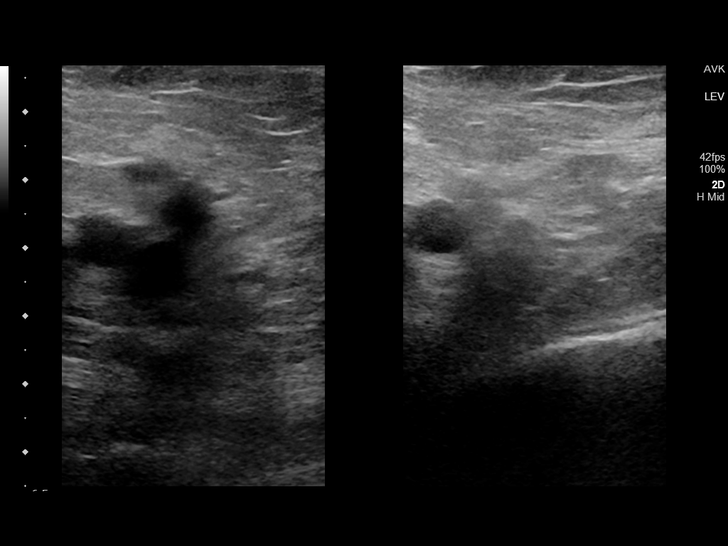
[im 12/38]
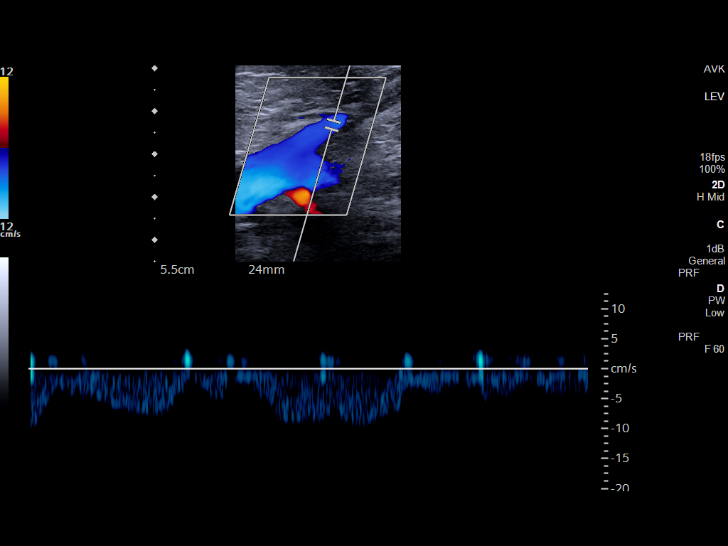
[im 15/38]
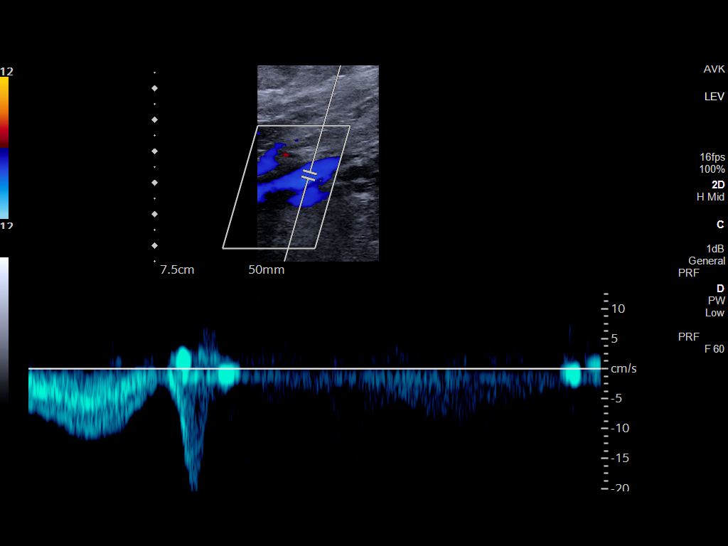
[im 18/38]
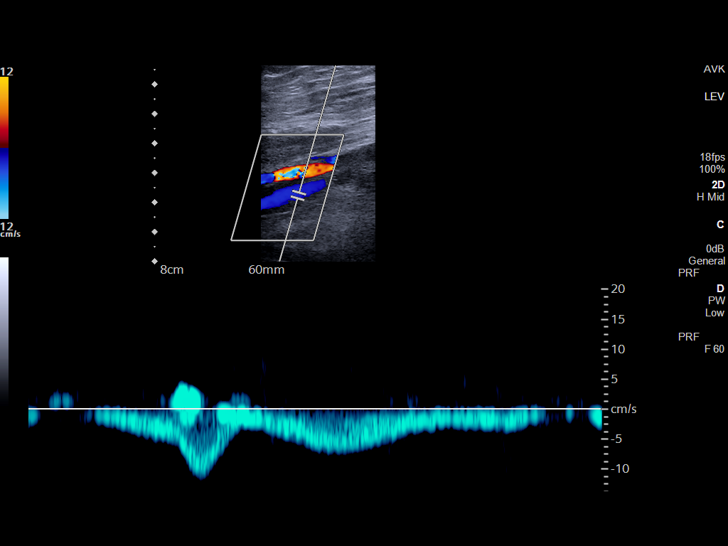
[im 20/38]
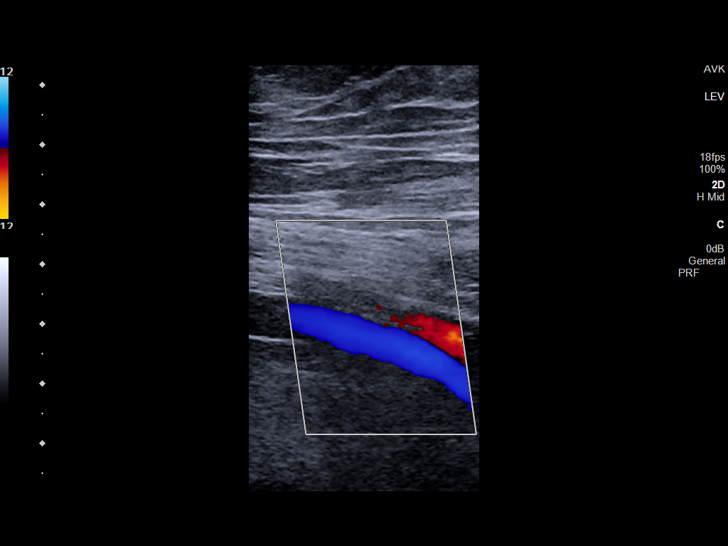
[im 23/38]
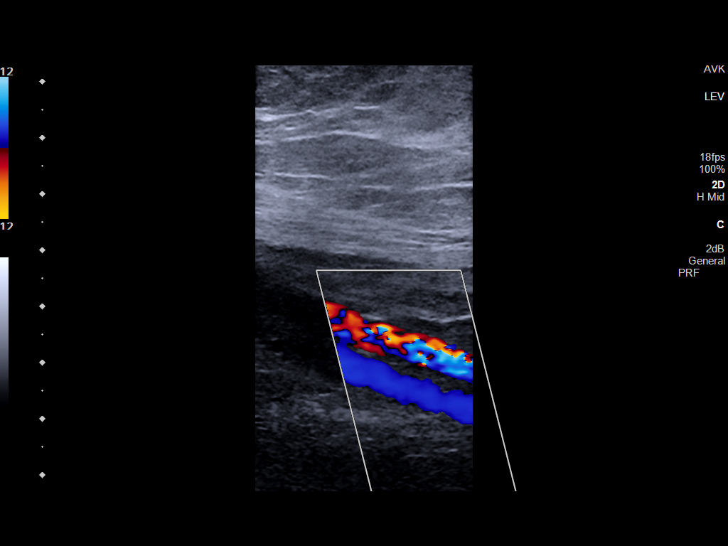
[im 26/38]
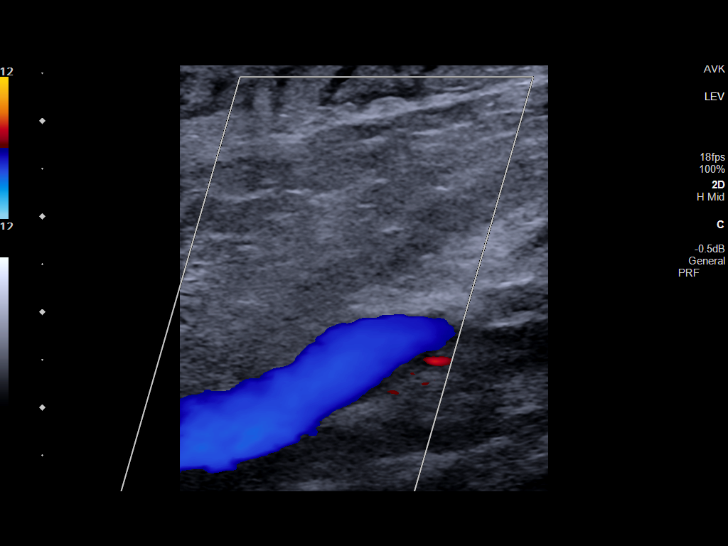
[im 29/38]
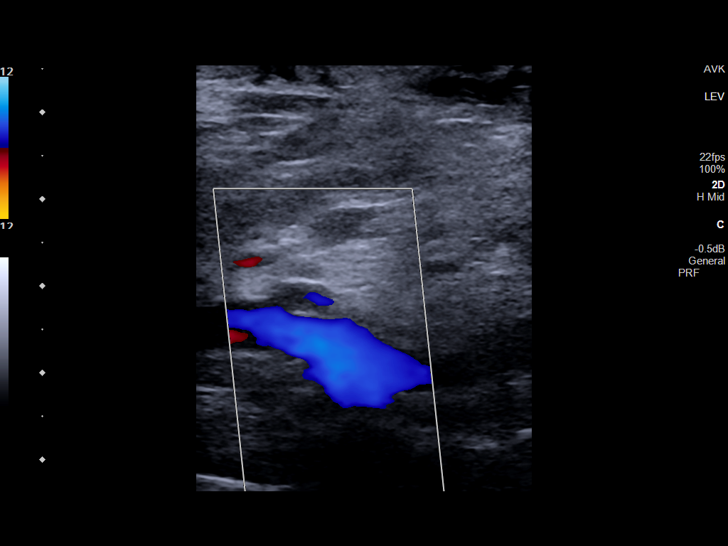
[im 31/38]
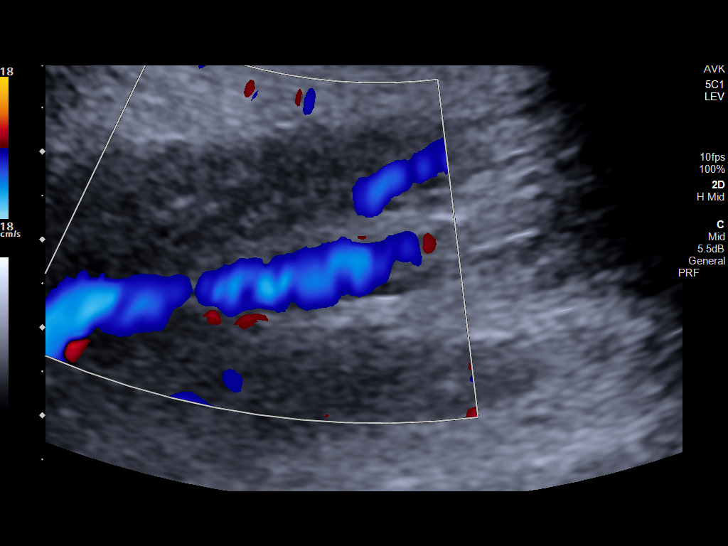
[im 34/38]
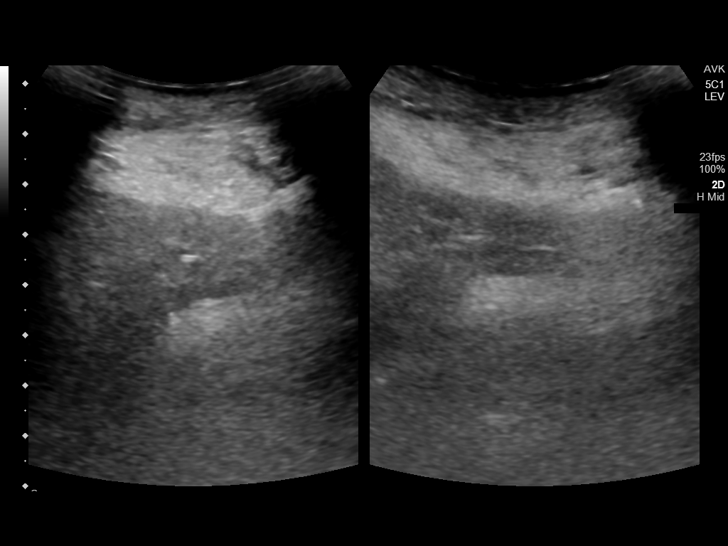
[im 38/38]
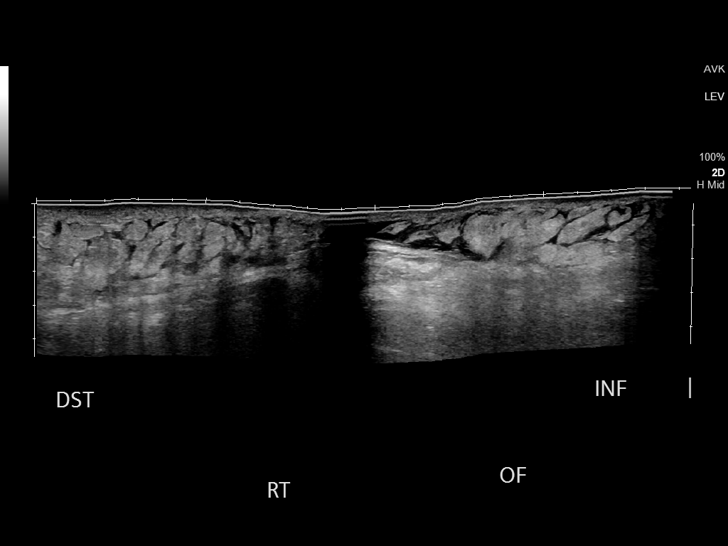

[14 of 24 positions shown; findings below may reference images not displayed]

FINDINGS: VENOUS

Normal compressibility of the common femoral, superficial femoral,
and popliteal veins, as well as the visualized calf veins.
Visualized portions of profunda femoral vein and great saphenous
vein unremarkable. No filling defects to suggest DVT on grayscale or
color Doppler imaging. Doppler waveforms show normal direction of
venous flow, normal respiratory plasticity and response to
augmentation.

Limited views of the contralateral common femoral vein are
unremarkable.

OTHER

None.

Limitations: none
IMPRESSION: Negative.

## 2022-02-10 ENCOUNTER — Encounter: Payer: Medicare HMO | Admitting: Physician Assistant

## 2022-02-10 DIAGNOSIS — I872 Venous insufficiency (chronic) (peripheral): Secondary | ICD-10-CM | POA: Diagnosis not present

## 2022-02-10 DIAGNOSIS — Z7901 Long term (current) use of anticoagulants: Secondary | ICD-10-CM | POA: Diagnosis not present

## 2022-02-10 DIAGNOSIS — I890 Lymphedema, not elsewhere classified: Secondary | ICD-10-CM | POA: Diagnosis not present

## 2022-02-10 DIAGNOSIS — I480 Paroxysmal atrial fibrillation: Secondary | ICD-10-CM | POA: Diagnosis not present

## 2022-02-10 DIAGNOSIS — E1151 Type 2 diabetes mellitus with diabetic peripheral angiopathy without gangrene: Secondary | ICD-10-CM | POA: Diagnosis not present

## 2022-02-10 DIAGNOSIS — E11621 Type 2 diabetes mellitus with foot ulcer: Secondary | ICD-10-CM | POA: Diagnosis not present

## 2022-02-10 DIAGNOSIS — L97812 Non-pressure chronic ulcer of other part of right lower leg with fat layer exposed: Secondary | ICD-10-CM | POA: Diagnosis not present

## 2022-02-10 DIAGNOSIS — S51811A Laceration without foreign body of right forearm, initial encounter: Secondary | ICD-10-CM | POA: Diagnosis not present

## 2022-02-10 DIAGNOSIS — E1140 Type 2 diabetes mellitus with diabetic neuropathy, unspecified: Secondary | ICD-10-CM | POA: Diagnosis not present

## 2022-02-10 DIAGNOSIS — I87333 Chronic venous hypertension (idiopathic) with ulcer and inflammation of bilateral lower extremity: Secondary | ICD-10-CM | POA: Diagnosis not present

## 2022-02-10 DIAGNOSIS — S51011A Laceration without foreign body of right elbow, initial encounter: Secondary | ICD-10-CM | POA: Diagnosis not present

## 2022-02-10 DIAGNOSIS — L97822 Non-pressure chronic ulcer of other part of left lower leg with fat layer exposed: Secondary | ICD-10-CM | POA: Diagnosis not present

## 2022-02-10 DIAGNOSIS — M1990 Unspecified osteoarthritis, unspecified site: Secondary | ICD-10-CM | POA: Diagnosis not present

## 2022-02-10 DIAGNOSIS — M109 Gout, unspecified: Secondary | ICD-10-CM | POA: Diagnosis not present

## 2022-02-10 DIAGNOSIS — Z86718 Personal history of other venous thrombosis and embolism: Secondary | ICD-10-CM | POA: Diagnosis not present

## 2022-02-10 NOTE — Telephone Encounter (Signed)
LVM for patient to call back to see about her sugars and let her know her refills are at the pharmacy for the ozempic .  Hena Ewalt,cma

## 2022-02-10 NOTE — Progress Notes (Addendum)
[name], [name] ([id number]) [id number] Page 1 of 8 Visit Report for 02/10/2022 Chief Complaint Document Details Patient Name: Date of Service: [name] 02/10/2022 3:15 PM Medical Record Number: [id number] Patient Account Number: [id number] Date of Birth/Sex: Treating RN: Jan 15, 1951 (71 y.o. [name] Primary Care Provider: Carollee [name] Other Clinician: Referring Provider: Treating Provider/Extender: [name] in Treatment: 0 Information Obtained from: Patient Chief Complaint Bilateral Leg Ulcers Electronic Signature(s) Signed: 02/10/2022 3:51:35 PM By: Dawn Keeler PA-C Entered By: [name] on 02/10/2022 15:51:35 -------------------------------------------------------------------------------- HPI Details Patient Name: Date of Service: [name], [place] 02/10/2022 3:15 PM Medical Record Number: [id number] Patient Account Number: [id number] Date of Birth/Sex: Treating RN: 06-Jul-1950 (71 y.o. [name] Primary Care Provider: Carollee [name] Other Clinician: Referring Provider: Treating Provider/Extender: [name] in Treatment: 0 History of Present Illness HPI Description: 06/28/20 upon evaluation today patient appears to be doing unfortunately somewhat poorly in regard to wounds that she has over her lower extremities. She has a wound on the left distal second toe, left posterior heel, and right anterior lower leg. With that being said all in all none of the wounds appear to be too bad the one that hurts her the most is actually the wound on the posterior heel which actually is also one of the smallest. Nonetheless I think there is some callus hiding what is really going on in this area. Fortunately there does not appear to be any obvious evidence of infection which is great news. Patient does have a history of diabetes mellitus type 2, chronic venous insufficiency, hypertension,  history of DVT and is on long-term anticoagulant s, therapy, Eliquis. 07/05/20 upon evaluation today patient appears to be doing well currently with regard to her wounds. I feel like she is making progress which is great news and overall there is no signs of active infection at this time. No fevers, chills, nausea, vomiting, or diarrhea. 4/29; patient has a only a small area remaining on the tip of her left second toe and a small area remaining on the left heel. The area on the right anterior lower leg is healed. They tell me that she had remote podiatric surgery on the toes of the left foot however they are very fixed in position and I wonder whether they are making it difficult to relieve pressure in her foot wear. She is a type II diabetic with neuropathy as well. 08/01/2020 upon evaluation today patient appears to be doing well currently in regard to her wounds. In fact everything appears to be doing much better. The posterior aspect of her heel is actually giving her some trouble not the Achilles area but a small wound that we did not even have marked. In fact I had noted this before. Nonetheless we are going to addressed that today. 5/27; most of the patient's wounds are healed including the left Achilles heel left toe. Right forearm is also healed. She has a new abrasion posteriorly in the [name], [name] ([id number]) [id number] Page 2 of 8 right elbow area just above the olecranon this happened today with an abrasion from her car door 6/15; the patient's wounds on the left Achilles and left second toe remain healed. The area on the right forearm is also just about healed in fact the skin I replaced last time is looking viable which is good. She had me look at the left heel again which is not in the area  of the wound more towards the tip of the heel at the Achilles insertion site as well as the tip of the left foot second toe. In the Achilles area that is clearly is  friction probably in bed at night. Not really near where the wound was. The left second toe remains healed although this is hammered and overhanging first toe also puts pressure in this area 09/13/2020 upon evaluation today patient appears to be doing well with regard to all previous wounds that she had. Everything is completely healed. With that being said I do think that the patient unfortunately has a new skin tear on the right forearm which is due to her put a Band-Aid on it and then when it pulled off this caused some issues with skin tearing. Nonetheless I think that this needs to be addressed today. Hopefully will take too long to get this to heal. She is having some issues with pain in her heel but I think this is more related to be honest to her neuropathy as opposed to anything else. Readmission: 10/30/2020 upon evaluation today patient appears to be doing poorly in regard to her legs bilaterally. She is also having an issue with her fourth toe on the left. Fortunately there does not appear to be any signs of active infection at this time. No fevers, chills, nausea, vomiting, or diarrhea. It is actually been quite a while since we have seen her. In fact June 24 was the last time that she was here in the clinic. She tells me she did not come back because things were just doing "pretty well". 11/29/2020 upon evaluation today patient actually appears to be doing well with regard to her arms those are completely healed. Her right leg is still open but doing okay. The fourth toe of the left foot does show some signs here of having some issues with necrotic tissue of an open wound. This is where a toenail was removed by podiatry. Subsequently I do think that we need to go ahead and see what we can do about getting this cleared away so being try to get some healing going forward. 12/13/2020 upon evaluation today patient appears to be doing decently well in regard to her wound on the right leg. In  general I think that she is making good progress here. Unfortunately she continues to have significant issues with swelling in the left leg is now even more swollen at this point. For that reason I do believe she would be benefited by wrapping both sides although she is not interested in doing this. Overall I think that she is definitely making some good progress here nonetheless in regard to the right leg left leg leave some to be desired. 12/27/2020 upon evaluation today patient appears to be doing well with regard to her wound in regard to the legs. Unfortunately the toe was not doing nearly as well. I am much more concerned about infection and osteomyelitis here. She had the MRI but right now were not seeing obvious evidence of redness spreading up to her foot this is good news I do not have the result of the MRI as of yet. I am good to place her on doxycycline however due to the fact that the wound does seem to be getting a little worse in regard to her toe. 01/14/2021 upon evaluation today patient appears to be doing well with regard to her ulcers. The right leg is doing well and even the toe seems to be doing  well though she still has some evidence of bone exposed on the toe which does not appear to be doing quite as good as I would like to see just and the fact that is still not covered over new tissue but looks tremendously better compared to 2 [name] ago. Overall very pleased in that regard. 01/28/2021 upon evaluation today patient's leg on the left actually appears to have new wounds this is due to her deciding to shave yesterday. Fortunately there does not appear to be any signs of infection currently. This is good news. Nonetheless I do believe that with these new areas open it would be best to wrap her leg to try to make sure we get this under control sooner rather than later. With regard to her toe there still does appear to be some evidence of bone exposed this was covered over with callus  I did remove that today. Nonetheless I think this is significantly smaller than previous. 02/11/2021 upon evaluation today patient appears to be doing okay in regard to her leg ulcers. I do not see anything obvious at this point this seems to be a complicating factor. I think that she is getting better. Were wrap of the right leg not the left leg. With that being said the left fourth toe ulcer still does have evidence of being open there is some drainage still on top of the wound I think we can to try to see about using something like a collagen dressing today to see if that would be of benefit for her. She is in agreement with giving this a trial. Nonetheless I am concerned about the fact that she continues to have issues here with this not healing it is confirmed chronic osteomyelitis she has been on antibiotics. I am going to extend those today as well. That is Keflex and that will be for 1 additional month. Nonetheless if she is not doing significantly better by the next time I see her I think that the biggest thing is going to be for [place] to go ahead and see what we can do about getting her started with hyperbaric oxygen therapy to try to save the toe. 02/20/2021 upon evaluation today patient presents for follow-up she actually came in a little bit early as the home health nurse was concerned about her toe becoming "gangrenous". With that being said the good news is there is [place] no evidence of this and in fact the toe seems to be doing better at this point. What was over the toe was an area that had bled significantly and then subsequently dried making it look much worse than where things actually stand. There is just a very small opening remaining in the toe. In regard to her legs everything appears to be healed bilaterally. 02/24/2021 upon evaluation today patient appears to be doing well with regard to her toe ulcer. I am actually very pleased with where we stand and I think that she is making  good progress at this time. There is still a very small opening although I think the biggest issue is the collagen seems to be getting stuck and drying up because the wound is so small we probably just need to use something to keep this moist like a little piece of Xeroform gauze and see if we get this to close. 03/07/2021 upon evaluation patient actually appears to be making excellent progress in regard to her last wound on the toe. There is just a very tiny potential area that  I can even be sure at first until I get some of the dry skin off when I did get this removed there was just a very tiny pinpoint opening that was barely even moist. Actually feel like this is very close to complete resolution. She has been using the Xeroform that is doing great. 03/25/2021 upon evaluation today patient appears to be doing well with regard to her toe ulcer. In fact this appears to be potentially completely healed. Fortunately I do not see anything draining at this point which is great news. She does have a small blood blister that is already drying up and looking okay on the third toe as well I am not sure what happened here she has an either. Otherwise she is having some weeping from the right leg this again is newer she tells me from last week weighing in that her pulmonologist and then today weighing in at the hematologist that she gained 30 pounds. This is quite significant if indeed true and I think she is to contact her primary care provider ASAP. This would account for why her right leg is weeping as well. 04/04/2020 upon evaluation today patient appears to be doing excellent in regard to her toe which I actually think is healed today. With that being said she unfortunately is having issues with her leg swelling and again several blisters on each leg that have reopened unfortunately. 05/02/2021 upon evaluation today patient appears to be doing well with regard to her wounds. In fact is down just 1 on the  anterior portion of her right leg which is actually from a blister. Otherwise she is doing awesome. 05/15/2021 upon evaluation today patient appears to be doing better in regard to her leg ulcer although she has a small irritated area from tape on the leg medially. She also has a skin tear to her right elbow region. Fortunately there does not appear to be any signs of active infection locally nor systemically at this point which is great news. No fevers, chills, nausea, vomiting, or diarrhea. 05/26/2021 upon evaluation today patient appears to be doing better in regard to the actual wounds although she is having a lot of leaking from the right leg. She is also been using her lymphedema pumps along with wearing her compression. Nonetheless she is having a lot of trouble with her breathing I am concerned that the pumps may be contributing to this. 06/12/2021 upon evaluation today patient appears to be doing better in regard to a lot of things including her breathing as well as her legs. She still has a lot of swelling but the good news is this is better she lost about 20 pounds of fluid when she went to the hospital and they diuresed her. Subsequently this is good news. Nonetheless she actually told me that "if she ever gets that bad again for me to yell at her to go to the hospital". She said that I was not quite forceful enough and telling her what she needed to do. She also notes she will try to be more receptive in the future. 06/19/2021 upon evaluation today patient appears to be doing well with regard to her wound. She is tolerating the dressing changes without complication. Fortunately her legs on the right are completely healed. She did have an area that she hit her Achilles on the left but this too seems to be pretty much closed at this point. [name], [name] ([id number]) [id number] Page 3 of 8 Readmission: 02-05-2022 upon evaluation  today patient appears to be doing  well currently in regard to her left leg although her right leg is actually giving her quite a bit more trouble at this point. Fortunately there does not appear to be any signs of infection locally or systemically which is great news. No fevers, chills, nausea, vomiting, or diarrhea. Patient tells me that she began having issues more recently with her legs in the right leg is definitely worse than the left as far as the way she feels as well and she states it was around September 16 that she first noted this and then when she called it took about 2-1/2 [name] to get in to see me. Her past medical history really has not changed since I saw her back in March everything is pretty much about the same repeat ABIs appear to be doing well currently at 1.1 on the left and 1.12 on the right. 02-10-2022 upon evaluation today patient actually appears to be doing well. Culture actually showed evidence of Pseudomonas but her legs look amazing today I do not see any signs of infection I do not believe that we need to add anything especially in light of the fact there is a lot of interactions with anything to treat the Pseudomonas in her current medication regimen. For that reason we will just hold with where we stand and continue with the current therapy. Electronic Signature(s) Signed: 02/10/2022 4:30:23 PM By: Dawn Keeler PA-C Entered By: [name] on 02/10/2022 16:30:22 -------------------------------------------------------------------------------- Physical Exam Details Patient Name: Date of Service: [name] 02/10/2022 3:15 PM Medical Record Number: [id number] Patient Account Number: [id number] Date of Birth/Sex: Treating RN: November 27, 1950 (71 y.o. [name] Primary Care Provider: Carollee [name] Other Clinician: Referring Provider: Treating Provider/Extender: [name] in Treatment: 0 Constitutional Well-nourished and well-hydrated in no acute  distress. Respiratory normal breathing without difficulty. Psychiatric this patient is able to make decisions and demonstrates good insight into disease process. Alert and Oriented x 3. pleasant and cooperative. Notes Fortunately there does not appear to be any signs of active infection at this time which is great news overall I think that she is actually doing quite well the right leg is probably ready to go to her compression socks next week for the left leg already she is using a compression sock and seems to be doing quite. Electronic Signature(s) Signed: 02/10/2022 4:30:41 PM By: Dawn Keeler PA-C Entered By: [name] on 02/10/2022 16:30:40 -------------------------------------------------------------------------------- Physician Orders Details Patient Name: Date of Service: [address], [place] [name]. 02/10/2022 3:15 PM [name] ([id number]) [id number] Page 4 of 8 Medical Record Number: [id number] Patient Account Number: [id number] Date of Birth/Sex: Treating RN: 1950-12-17 (71 y.o. [name] Primary Care Provider: Carollee [name] Other Clinician: Referring Provider: Treating Provider/Extender: [name] in Treatment: 0 Verbal / Phone Orders: No Diagnosis Coding ICD-10 Coding Code Description E11.621 Type 2 diabetes mellitus with foot ulcer I87.333 Chronic venous hypertension (idiopathic) with ulcer and inflammation of bilateral lower extremity L97.812 Non-pressure chronic ulcer of other part of right lower leg with fat layer exposed L97.822 Non-pressure chronic ulcer of other part of left lower leg with fat layer exposed Z79.01 Long term (current) use of anticoagulants I48.0 Paroxysmal atrial fibrillation Wound Treatment Electronic Signature(s) Signed: 02/10/2022 5:13:25 PM By: Rosalio Loud MSN RN CNS WTA Signed: 02/10/2022 5:41:33 PM By: Dawn Keeler PA-C Entered By: Rosalio Loud on 02/10/2022  16:13:31 -------------------------------------------------------------------------------- Problem  List Details Patient Name: Date of Service: [name] 02/10/2022 3:15 PM Medical Record Number: [id number] Patient Account Number: [id number] Date of Birth/Sex: Treating RN: January 24, 1951 (71 y.o. [name] Primary Care Provider: Carollee [name] Other Clinician: Referring Provider: Treating Provider/Extender: [name] in Treatment: 0 Active Problems ICD-10 Encounter Code Description Active Date MDM Diagnosis E11.621 Type 2 diabetes mellitus with foot ulcer 02/05/2022 No Yes I87.333 Chronic venous hypertension (idiopathic) with ulcer and inflammation of 02/05/2022 No Yes bilateral lower extremity L97.812 Non-pressure chronic ulcer of other part of right lower leg with fat layer 02/05/2022 No Yes exposed L97.822 Non-pressure chronic ulcer of other part of left lower leg with fat layer exposed11/16/2023 No Yes Z79.01 Long term (current) use of anticoagulants 02/05/2022 No Yes [name], [name] ([id number]) [id number] Page 5 of 8 I48.0 Paroxysmal atrial fibrillation 02/05/2022 No Yes Inactive Problems Resolved Problems Electronic Signature(s) Signed: 02/10/2022 5:13:25 PM By: Rosalio Loud MSN RN CNS WTA Signed: 02/10/2022 5:41:33 PM By: Dawn Keeler PA-C Previous Signature: 02/10/2022 3:51:22 PM Version By: Dawn Keeler PA-C Entered By: Rosalio Loud on 02/10/2022 16:16:11 -------------------------------------------------------------------------------- Progress Note Details Patient Name: Date of Service: [name], Latrelle Dodrill [name]. 02/10/2022 3:15 PM Medical Record Number: [id number] Patient Account Number: [id number] Date of Birth/Sex: Treating RN: 09-08-1950 (71 y.o. [name] Primary Care Provider: Carollee [name] Other Clinician: Referring Provider: Treating Provider/Extender: [name] in Treatment:  0 Subjective Chief Complaint Information obtained from Patient Bilateral Leg Ulcers History of Present Illness (HPI) 06/28/20 upon evaluation today patient appears to be doing unfortunately somewhat poorly in regard to wounds that she has over her lower extremities. She has a wound on the left distal second toe, left posterior heel, and right anterior lower leg. With that being said all in all none of the wounds appear to be too bad the one that hurts her the most is actually the wound on the posterior heel which actually is also one of the smallest. Nonetheless I think there is some callus hiding what is really going on in this area. Fortunately there does not appear to be any obvious evidence of infection which is great news. Patient does have a history of diabetes mellitus type 2, chronic venous insufficiency, hypertension, history of DVT and is on long-term anticoagulant therapy, Eliquis. s, 07/05/20 upon evaluation today patient appears to be doing well currently with regard to her wounds. I feel like she is making progress which is great news and overall there is no signs of active infection at this time. No fevers, chills, nausea, vomiting, or diarrhea. 4/29; patient has a only a small area remaining on the tip of her left second toe and a small area remaining on the left heel. The area on the right anterior lower leg is healed. They tell me that she had remote podiatric surgery on the toes of the left foot however they are very fixed in position and I wonder whether they are making it difficult to relieve pressure in her foot wear. She is a type II diabetic with neuropathy as well. 08/01/2020 upon evaluation today patient appears to be doing well currently in regard to her wounds. In fact everything appears to be doing much better. The posterior aspect of her heel is actually giving her some trouble not the Achilles area but a small wound that we did not even have marked. In fact I had  noted this before. Nonetheless we are going to addressed  that today. 5/27; most of the patient's wounds are healed including the left Achilles heel left toe. Right forearm is also healed. She has a new abrasion posteriorly in the right elbow area just above the olecranon this happened today with an abrasion from her car door 6/15; the patient's wounds on the left Achilles and left second toe remain healed. The area on the right forearm is also just about healed in fact the skin I replaced last time is looking viable which is good. She had me look at the left heel again which is not in the area of the wound more towards the tip of the heel at the Achilles insertion site as well as the tip of the left foot second toe. In the Achilles area that is clearly is friction probably in bed at night. Not really near where the wound was. The left second toe remains healed although this is hammered and overhanging first toe also puts pressure in this area 09/13/2020 upon evaluation today patient appears to be doing well with regard to all previous wounds that she had. Everything is completely healed. With that being said I do think that the patient unfortunately has a new skin tear on the right forearm which is due to her put a Band-Aid on it and then when it pulled off this caused some issues with skin tearing. Nonetheless I think that this needs to be addressed today. Hopefully will take too long to get this to heal. She is having some issues with pain in her heel but I think this is more related to be honest to her neuropathy as opposed to anything else. Readmission: 10/30/2020 upon evaluation today patient appears to be doing poorly in regard to her legs bilaterally. She is also having an issue with her fourth toe on the left. Fortunately there does not appear to be any signs of active infection at this time. No fevers, chills, nausea, vomiting, or diarrhea. It is actually been quite a while since we have seen  her. In fact June 24 was the last time that she was here in the clinic. She tells me she did not come back because things were just doing "pretty well". [name], [name] ([id number]) [id number] Page 6 of 8 11/29/2020 upon evaluation today patient actually appears to be doing well with regard to her arms those are completely healed. Her right leg is still open but doing okay. The fourth toe of the left foot does show some signs here of having some issues with necrotic tissue of an open wound. This is where a toenail was removed by podiatry. Subsequently I do think that we need to go ahead and see what we can do about getting this cleared away so being try to get some healing going forward. 12/13/2020 upon evaluation today patient appears to be doing decently well in regard to her wound on the right leg. In general I think that she is making good progress here. Unfortunately she continues to have significant issues with swelling in the left leg is now even more swollen at this point. For that reason I do believe she would be benefited by wrapping both sides although she is not interested in doing this. Overall I think that she is definitely making some good progress here nonetheless in regard to the right leg left leg leave some to be desired. 12/27/2020 upon evaluation today patient appears to be doing well with regard to her wound in regard to the legs. Unfortunately the toe  was not doing nearly as well. I am much more concerned about infection and osteomyelitis here. She had the MRI but right now were not seeing obvious evidence of redness spreading up to her foot this is good news I do not have the result of the MRI as of yet. I am good to place her on doxycycline however due to the fact that the wound does seem to be getting a little worse in regard to her toe. 01/14/2021 upon evaluation today patient appears to be doing well with regard to her ulcers. The right leg is doing  well and even the toe seems to be doing well though she still has some evidence of bone exposed on the toe which does not appear to be doing quite as good as I would like to see just and the fact that is still not covered over new tissue but looks tremendously better compared to 2 [name] ago. Overall very pleased in that regard. 01/28/2021 upon evaluation today patient's leg on the left actually appears to have new wounds this is due to her deciding to shave yesterday. Fortunately there does not appear to be any signs of infection currently. This is good news. Nonetheless I do believe that with these new areas open it would be best to wrap her leg to try to make sure we get this under control sooner rather than later. With regard to her toe there still does appear to be some evidence of bone exposed this was covered over with callus I did remove that today. Nonetheless I think this is significantly smaller than previous. 02/11/2021 upon evaluation today patient appears to be doing okay in regard to her leg ulcers. I do not see anything obvious at this point this seems to be a complicating factor. I think that she is getting better. Were wrap of the right leg not the left leg. With that being said the left fourth toe ulcer still does have evidence of being open there is some drainage still on top of the wound I think we can to try to see about using something like a collagen dressing today to see if that would be of benefit for her. She is in agreement with giving this a trial. Nonetheless I am concerned about the fact that she continues to have issues here with this not healing it is confirmed chronic osteomyelitis she has been on antibiotics. I am going to extend those today as well. That is Keflex and that will be for 1 additional month. Nonetheless if she is not doing significantly better by the next time I see her I think that the biggest thing is going to be for [place] to go ahead and see what we can do  about getting her started with hyperbaric oxygen therapy to try to save the toe. 02/20/2021 upon evaluation today patient presents for follow-up she actually came in a little bit early as the home health nurse was concerned about her toe becoming "gangrenous". With that being said the good news is there is [place] no evidence of this and in fact the toe seems to be doing better at this point. What was over the toe was an area that had bled significantly and then subsequently dried making it look much worse than where things actually stand. There is just a very small opening remaining in the toe. In regard to her legs everything appears to be healed bilaterally. 02/24/2021 upon evaluation today patient appears to be doing well with regard to  her toe ulcer. I am actually very pleased with where we stand and I think that she is making good progress at this time. There is still a very small opening although I think the biggest issue is the collagen seems to be getting stuck and drying up because the wound is so small we probably just need to use something to keep this moist like a little piece of Xeroform gauze and see if we get this to close. 03/07/2021 upon evaluation patient actually appears to be making excellent progress in regard to her last wound on the toe. There is just a very tiny potential area that I can even be sure at first until I get some of the dry skin off when I did get this removed there was just a very tiny pinpoint opening that was barely even moist. Actually feel like this is very close to complete resolution. She has been using the Xeroform that is doing great. 03/25/2021 upon evaluation today patient appears to be doing well with regard to her toe ulcer. In fact this appears to be potentially completely healed. Fortunately I do not see anything draining at this point which is great news. She does have a small blood blister that is already drying up and looking okay on the third toe as  well I am not sure what happened here she has an either. Otherwise she is having some weeping from the right leg this again is newer she tells me from last week weighing in that her pulmonologist and then today weighing in at the hematologist that she gained 30 pounds. This is quite significant if indeed true and I think she is to contact her primary care provider ASAP. This would account for why her right leg is weeping as well. 04/04/2020 upon evaluation today patient appears to be doing excellent in regard to her toe which I actually think is healed today. With that being said she unfortunately is having issues with her leg swelling and again several blisters on each leg that have reopened unfortunately. 05/02/2021 upon evaluation today patient appears to be doing well with regard to her wounds. In fact is down just 1 on the anterior portion of her right leg which is actually from a blister. Otherwise she is doing awesome. 05/15/2021 upon evaluation today patient appears to be doing better in regard to her leg ulcer although she has a small irritated area from tape on the leg medially. She also has a skin tear to her right elbow region. Fortunately there does not appear to be any signs of active infection locally nor systemically at this point which is great news. No fevers, chills, nausea, vomiting, or diarrhea. 05/26/2021 upon evaluation today patient appears to be doing better in regard to the actual wounds although she is having a lot of leaking from the right leg. She is also been using her lymphedema pumps along with wearing her compression. Nonetheless she is having a lot of trouble with her breathing I am concerned that the pumps may be contributing to this. 06/12/2021 upon evaluation today patient appears to be doing better in regard to a lot of things including her breathing as well as her legs. She still has a lot of swelling but the good news is this is better she lost about 20 pounds of fluid  when she went to the hospital and they diuresed her. Subsequently this is good news. Nonetheless she actually told me that "if she ever gets that bad again for  me to yell at her to go to the hospital". She said that I was not quite forceful enough and telling her what she needed to do. She also notes she will try to be more receptive in the future. 06/19/2021 upon evaluation today patient appears to be doing well with regard to her wound. She is tolerating the dressing changes without complication. Fortunately her legs on the right are completely healed. She did have an area that she hit her Achilles on the left but this too seems to be pretty much closed at this point. Readmission: 02-05-2022 upon evaluation today patient appears to be doing well currently in regard to her left leg although her right leg is actually giving her quite a bit more trouble at this point. Fortunately there does not appear to be any signs of infection locally or systemically which is great news. No fevers, chills, nausea, vomiting, or diarrhea. Patient tells me that she began having issues more recently with her legs in the right leg is definitely worse than the left as far as the way she feels as well and she states it was around September 16 that she first noted this and then when she called it took about 2-1/2 [name] to get in to see me. Her past medical history really has not changed since I saw her back in March everything is pretty much about the same repeat ABIs appear to be doing well currently at 1.1 on the left and 1.12 on the right. 02-10-2022 upon evaluation today patient actually appears to be doing well. Culture actually showed evidence of Pseudomonas but her legs look amazing today I do not see any signs of infection I do not believe that we need to add anything especially in light of the fact there is a lot of interactions with anything to treat the Pseudomonas in her current medication regimen. For that  reason we will just hold with where we stand and continue with the current therapy. [name], [name] ([id number]) [id number] Page 7 of 8 Objective Constitutional Well-nourished and well-hydrated in no acute distress. Vitals Time Taken: 3:55 PM, Height: 66 in, Weight: 263 lbs, BMI: 42.4, Temperature: 98.1 F, Pulse: 87 bpm, Respiratory Rate: 16 breaths/min, Blood Pressure: 144/83 mmHg. Respiratory normal breathing without difficulty. Psychiatric this patient is able to make decisions and demonstrates good insight into disease process. Alert and Oriented x 3. pleasant and cooperative. General Notes: Fortunately there does not appear to be any signs of active infection at this time which is great news overall I think that she is actually doing quite well the right leg is probably ready to go to her compression socks next week for the left leg already she is using a compression sock and seems to be doing quite. Integumentary (Hair, Skin) Wound #22 status is Open. Original cause of wound was Gradually Appeared. The date acquired was: 12/06/2021. The wound is located on the Right,Lateral,Anterior,Circumferential Lower Leg. The wound measures 14cm length x 11cm width x 0.1cm depth; 120.951cm^2 area and 12.095cm^3 volume. There is Fat Layer (Subcutaneous Tissue) exposed. There is a medium amount of serous drainage noted. There is small (1-33%) red, pink granulation within the wound bed. Wound #23 status is Open. Original cause of wound was Gradually Appeared. The date acquired was: 12/06/2021. The wound is located on the Left,Midline Lower Leg. The wound measures 0.7cm length x 0.6cm width x 0.1cm depth; 0.33cm^2 area and 0.033cm^3 volume. There is a medium amount of serous drainage noted. There is small (  1-33%) red granulation within the wound bed. Assessment Active Problems ICD-10 Type 2 diabetes mellitus with foot ulcer Chronic venous hypertension (idiopathic) with  ulcer and inflammation of bilateral lower extremity Non-pressure chronic ulcer of other part of right lower leg with fat layer exposed Non-pressure chronic ulcer of other part of left lower leg with fat layer exposed Long term (current) use of anticoagulants Paroxysmal atrial fibrillation Plan 1. I am going to recommend that we have the patient continue to monitor for any signs of worsening infection for now we will continue using her own compression sock on the left leg with silver alginate over the open area which is doing quite well. 2. I am also can recommend that we continue with the silver alginate and the 3 layer compression wrap on the right leg. 3. I would also suggest the patient should continue to monitor for any signs of worsening infection. Obviously if anything changes she knows to contact the office and let me know. We will see patient back for reevaluation in 1 week here in the clinic. If anything worsens or changes patient will contact our office for additional recommendations. Electronic Signature(s) Signed: 02/10/2022 4:31:52 PM By: Dawn Keeler PA-C Entered By: [name] on 02/10/2022 16:31:51 [name] ([id number]) [id number] Page 8 of 8 -------------------------------------------------------------------------------- SuperBill Details Patient Name: Date of Service: [name] 02/10/2022 Medical Record Number: [id number] Patient Account Number: [id number] Date of Birth/Sex: Treating RN: March 23, 1951 (71 y.o. [name] Primary Care Provider: Carollee [name] Other Clinician: Referring Provider: Treating Provider/Extender: [name] in Treatment: 0 Diagnosis Coding ICD-10 Codes Code Description E11.621 Type 2 diabetes mellitus with foot ulcer I87.333 Chronic venous hypertension (idiopathic) with ulcer and inflammation of bilateral lower extremity L97.812 Non-pressure chronic ulcer of other part  of right lower leg with fat layer exposed L97.822 Non-pressure chronic ulcer of other part of left lower leg with fat layer exposed Z79.01 Long term (current) use of anticoagulants I48.0 Paroxysmal atrial fibrillation Facility Procedures : CPT4 Code: [id number] Description: 99213 - WOUND CARE VISIT-LEV 3 EST PT Modifier: Quantity: 1 Physician Procedures : CPT4 Code Description Modifier [id number] - WC PHYS LEVEL 3 - EST PT ICD-10 Diagnosis Description E11.621 Type 2 diabetes mellitus with foot ulcer I87.333 Chronic venous hypertension (idiopathic) with ulcer and inflammation of bilateral lower  extremity L97.812 Non-pressure chronic ulcer of other part of right lower leg with fat layer exposed L97.822 Non-pressure chronic ulcer of other part of left lower leg with fat layer exposed Quantity: 1 Electronic Signature(s) Signed: 02/10/2022 4:38:47 PM By: Dawn Keeler PA-C Entered By: [name] on 02/10/2022 16:38:46

## 2022-02-10 NOTE — Progress Notes (Addendum)
MIRRA, BASILIO ([id number]) [phone].pdf Page 1 of 10 Visit Report for 02/10/2022 Arrival Information Details Patient Name: Date of Service: [name] 02/10/2022 3:15 PM Medical Record Number: [id number] Patient Account Number: [id number] Date of Birth/Sex: Treating RN: [birth year] (71 y.o. [name] Primary Care Charisse Wendell: Carollee Leitz Other Clinician: Referring Reid Regas: Treating Elana Jian/Extender: Carolan Shiver in Treatment: 0 Visit Information History Since Last Visit Added or deleted any medications: No Patient Arrived: Wheel Chair Any new allergies or adverse reactions: No Arrival Time: 15:48 Had a fall or experienced change in No Accompanied By: husband activities of daily living that may affect Transfer Assistance: None risk of falls: Patient Identification Verified: Yes Hospitalized since last visit: No Secondary Verification Process Completed: Yes Pain Present Now: No Patient Requires Transmission-Based Precautions: No Patient Has Alerts: Yes Patient Alerts: Patient on Blood Thinner DMII Eliquis Electronic Signature(s) Signed: 02/10/2022 5:13:25 PM By: Rosalio Loud MSN RN CNS WTA Entered By: Rosalio Loud on 02/10/2022 15:55:28 -------------------------------------------------------------------------------- Clinic Level of Care Assessment Details Patient Name: Date of Service: [name] 02/10/2022 3:15 PM Medical Record Number: [id number] Patient Account Number: [id number] Date of Birth/Sex: Treating RN: [birth year] (71 y.o. [name] Primary Care Elenie Coven: Carollee Leitz Other Clinician: Referring Toya Palacios: Treating Analiz Tvedt/Extender: Carolan Shiver in Treatment: 0 Clinic Level of Care Assessment Items TOOL 4 Quantity Score X- 1 0 Use when only an EandM is performed on FOLLOW-UP visit ASSESSMENTS - Nursing Assessment / Reassessment X- 1 10 Reassessment of Co-morbidities  (includes updates in patient status) X- 1 5 Reassessment of Adherence to Treatment Plan ASSESSMENTS - Wound and Skin A ssessment / Reassessment '[]'$  - 0 Simple Wound Assessment / Reassessment - one wound Dawn, NOLTON [name] ([id number]) [phone].pdf Page 2 of 10 X- 2 5 Complex Wound Assessment / Reassessment - multiple wounds '[]'$  - 0 Dermatologic / Skin Assessment (not related to wound area) ASSESSMENTS - Focused Assessment '[]'$  - 0 Circumferential Edema Measurements - multi extremities '[]'$  - 0 Nutritional Assessment / Counseling / Intervention '[]'$  - 0 Lower Extremity Assessment (monofilament, tuning fork, pulses) '[]'$  - 0 Peripheral Arterial Disease Assessment (using hand held doppler) ASSESSMENTS - Ostomy and/or Continence Assessment and Care '[]'$  - 0 Incontinence Assessment and Management '[]'$  - 0 Ostomy Care Assessment and Management (repouching, etc.) PROCESS - Coordination of Care '[]'$  - 0 Simple Patient / Family Education for ongoing care '[]'$  - 0 Complex (extensive) Patient / Family Education for ongoing care X- 1 10 Staff obtains Programmer, systems, Records, T Results / Process Orders est '[]'$  - 0 Staff telephones HHA, Nursing Homes / Clarify orders / etc '[]'$  - 0 Routine Transfer to another Facility (non-emergent condition) '[]'$  - 0 Routine Hospital Admission (non-emergent condition) '[]'$  - 0 New Admissions / Biomedical engineer / Ordering NPWT Apligraf, etc. , '[]'$  - 0 Emergency Hospital Admission (emergent condition) '[]'$  - 0 Simple Discharge Coordination X- 1 15 Complex (extensive) Discharge Coordination PROCESS - Special Needs '[]'$  - 0 Pediatric / Minor Patient Management '[]'$  - 0 Isolation Patient Management '[]'$  - 0 Hearing / Language / Visual special needs '[]'$  - 0 Assessment of Community assistance (transportation, D/C planning, etc.) '[]'$  - 0 Additional assistance / Altered mentation '[]'$  - 0 Support Surface(s) Assessment (bed, cushion, seat, etc.) INTERVENTIONS  - Wound Cleansing / Measurement '[]'$  - 0 Simple Wound Cleansing - one wound X- 2 5 Complex Wound Cleansing - multiple wounds X- 1 5 Wound Imaging (photographs - any number of wounds) '[]'$  -  0 Wound Tracing (instead of photographs) '[]'$  - 0 Simple Wound Measurement - one wound X- 2 5 Complex Wound Measurement - multiple wounds INTERVENTIONS - Wound Dressings X - Small Wound Dressing one or multiple wounds 1 10 '[]'$  - 0 Medium Wound Dressing one or multiple wounds '[]'$  - 0 Large Wound Dressing one or multiple wounds '[]'$  - 0 Application of Medications - topical '[]'$  - 0 Application of Medications - injection INTERVENTIONS - Miscellaneous '[]'$  - 0 External ear exam '[]'$  - 0 Specimen Collection (cultures, biopsies, blood, body fluids, etc.) CLAUDELL, RHODY [name] ([id number]) [phone].pdf Page 3 of 10 '[]'$  - 0 Specimen(s) / Culture(s) sent or taken to Lab for analysis '[]'$  - 0 Patient Transfer (multiple staff / Harrel Lemon Lift / Similar devices) '[]'$  - 0 Simple Staple / Suture removal (25 or less) '[]'$  - 0 Complex Staple / Suture removal (26 or more) '[]'$  - 0 Hypo / Hyperglycemic Management (close monitor of Blood Glucose) '[]'$  - 0 Ankle / Brachial Index (ABI) - do not check if billed separately X- 1 5 Vital Signs Has the patient been seen at the hospital within the last three years: Yes Total Score: 90 Level Of Care: New/Established - Level 3 Electronic Signature(s) Signed: 02/10/2022 5:13:25 PM By: Rosalio Loud MSN RN CNS WTA Entered By: Rosalio Loud on 02/10/2022 16:15:34 -------------------------------------------------------------------------------- Encounter Discharge Information Details Patient Name: Date of Service: [name], [name]. 02/10/2022 3:15 PM Medical Record Number: [id number] Patient Account Number: [id number] Date of Birth/Sex: Treating RN: 07-Feb-1951 (71 y.o. [name] Primary Care Zedekiah Hinderman: Carollee Leitz Other Clinician: Referring Etan Vasudevan: Treating  Starlin Steib/Extender: Carolan Shiver in Treatment: 0 Encounter Discharge Information Items Discharge Condition: Stable Ambulatory Status: Wheelchair Discharge Destination: Home Transportation: Private Auto Accompanied By: husband Schedule Follow-up Appointment: Yes Clinical Summary of Care: Electronic Signature(s) Signed: 02/10/2022 5:13:25 PM By: Rosalio Loud MSN RN CNS WTA Entered By: Rosalio Loud on 02/10/2022 16:16:59 -------------------------------------------------------------------------------- Lower Extremity Assessment Details Patient Name: Date of Service: [name] 02/10/2022 3:15 PM Medical Record Number: [id number] Patient Account Number: [id number] Date of Birth/Sex: Treating RN: 1951/01/07 (71 y.o. Dinisha, Cai, [place] [name] ([id number]) [id number] Page 4 of 10 Primary Care Lakecia Deschamps: Carollee Leitz Other Clinician: Referring Amalio Loe: Treating Vitalia Stough/Extender: Keturah Barre Weeks in Treatment: 0 Edema Assessment Assessed: [Left: No] [Right: No] [Left: Edema] [Right: :] Calf Left: Right: Point of Measurement: 32 cm From Medial Instep 34.5 cm 33.5 cm Ankle Left: Right: Point of Measurement: 10 cm From Medial Instep 17 cm 18 cm Vascular Assessment Pulses: Dorsalis Pedis Palpable: [Left:Yes] [Right:Yes] Electronic Signature(s) Signed: 02/10/2022 5:13:25 PM By: Rosalio Loud MSN RN CNS WTA Entered By: Rosalio Loud on 02/10/2022 16:58:06 -------------------------------------------------------------------------------- Multi Wound Chart Details Patient Name: Date of Service: [name], [name]. 02/10/2022 3:15 PM Medical Record Number: [id number] Patient Account Number: [id number] Date of Birth/Sex: Treating RN: 1950/08/12 (71 y.o. [name] Primary Care Nuri Larmer: Carollee Leitz Other Clinician: Referring Hansford Hirt: Treating Peyton Spengler/Extender: Keturah Barre Weeks in Treatment:  0 Vital Signs Height(in): 66 Pulse(bpm): 87 Weight(lbs): 433 Blood Pressure(mmHg): 144/83 Body Mass Index(BMI): 42.4 Temperature(F): 98.1 Respiratory Rate(breaths/min): 16 [22:Photos:] [N/A:N/A] Right, Lateral, Anterior, Circumferential Left, Midline Lower Leg N/A Wound Location: Lower Leg Gradually Appeared Gradually Appeared N/A Wounding Event: Diabetic Wound/Ulcer of the Lower Diabetic Wound/Ulcer of the Lower N/A Primary Etiology: Extremity Extremity AVAYAH, RAFFETY [name] ([id number]) [id number] Page 5 of 10 Asthma, Pneumothorax, Arrhythmia, Asthma, Pneumothorax, Arrhythmia, N/A Comorbid History: Hypertension, Type  II Diabetes, Gout, Hypertension, Type II Diabetes, Gout, Osteoarthritis, Neuropathy Osteoarthritis, Neuropathy 12/06/2021 12/06/2021 N/A Date Acquired: 0 0 N/A Weeks of Treatment: Open Open N/A Wound Status: No No N/A Wound Recurrence: Yes No N/A Clustered Wound: 14x11x0.1 0.7x0.6x0.1 N/A Measurements L x W x D (cm) 120.951 0.33 N/A A (cm) : rea 12.095 0.033 N/A Volume (cm) : 18.80% -110.20% N/A % Reduction in A rea: 18.80% -106.20% N/A % Reduction in Volume: Grade 1 Grade 2 N/A Classification: Medium Medium N/A Exudate A mount: Serous Serous N/A Exudate Type: amber amber N/A Exudate Color: Small (1-33%) Small (1-33%) N/A Granulation A mount: Red, Pink Red N/A Granulation Quality: Fat Layer (Subcutaneous Tissue): Yes Fascia: No N/A Exposed Structures: Fascia: No Fat Layer (Subcutaneous Tissue): No Tendon: No Tendon: No Muscle: No Muscle: No Joint: No Joint: No Bone: No Bone: No N/A Small (1-33%) N/A Epithelialization: Treatment Notes Electronic Signature(s) Signed: 02/10/2022 5:13:25 PM By: Rosalio Loud MSN RN CNS WTA Entered By: Rosalio Loud on 02/10/2022 16:13:11 -------------------------------------------------------------------------------- Multi-Disciplinary Care Plan Details Patient Name: Date of  Service: [name], [place] [name]. 02/10/2022 3:15 PM Medical Record Number: [id number] Patient Account Number: [id number] Date of Birth/Sex: Treating RN: Aug 25, 1950 (71 y.o. [name] Primary Care Carlee Vonderhaar: Carollee Leitz Other Clinician: Referring Laysha Childers: Treating Jamee Pacholski/Extender: Carolan Shiver in Treatment: 0 Active Inactive Orientation to the Wound Care Program Nursing Diagnoses: Knowledge deficit related to the wound healing center program Goals: Patient/caregiver will verbalize understanding of the [facility] Date Initiated: 02/05/2022 Target Resolution Date: 02/26/2022 Goal Status: Active Interventions: Provide education on orientation to the wound center Notes: Wound/Skin Impairment Nursing Diagnoses: SABRIA, FLORIDO ([id number]) [phone].pdf Page 6 of 10 Impaired tissue integrity Knowledge deficit related to ulceration/compromised skin integrity Goals: Patient will have a decrease in wound volume by X% from date: (specify in notes) Date Initiated: 02/05/2022 Target Resolution Date: 03/07/2022 Goal Status: Active Patient/caregiver will verbalize understanding of skin care regimen Date Initiated: 02/05/2022 Target Resolution Date: 03/07/2022 Goal Status: Active Ulcer/skin breakdown will have a volume reduction of 30% by week 4 Date Initiated: 02/05/2022 Target Resolution Date: 03/07/2022 Goal Status: Active Ulcer/skin breakdown will have a volume reduction of 50% by week 8 Date Initiated: 02/05/2022 Target Resolution Date: 04/07/2022 Goal Status: Active Ulcer/skin breakdown will have a volume reduction of 80% by week 12 Date Initiated: 02/05/2022 Target Resolution Date: 05/08/2022 Goal Status: Active Ulcer/skin breakdown will heal within 14 weeks Date Initiated: 02/05/2022 Target Resolution Date: 05/22/2022 Goal Status: Active Interventions: Assess patient/caregiver ability to obtain necessary  supplies Assess patient/caregiver ability to perform ulcer/skin care regimen upon admission and as needed Assess ulceration(s) every visit Provide education on ulcer and skin care Treatment Activities: Referred to DME Calvin Chura for dressing supplies : 02/05/2022 Skin care regimen initiated : 02/05/2022 Notes: Electronic Signature(s) Signed: 02/10/2022 5:13:25 PM By: Rosalio Loud MSN RN CNS WTA Entered By: Rosalio Loud on 02/10/2022 16:12:51 -------------------------------------------------------------------------------- Pain Assessment Details Patient Name: Date of Service: [name], [name]. 02/10/2022 3:15 PM Medical Record Number: [id number] Patient Account Number: [id number] Date of Birth/Sex: Treating RN: 1950-07-17 (71 y.o. [name] Primary Care Manveer Gomes: Carollee Leitz Other Clinician: Referring Lien Lyman: Treating Gretna Bergin/Extender: Keturah Barre Weeks in Treatment: 0 Active Problems Location of Pain Severity and Description of Pain Patient Has Paino No Site Locations MYRA, WENG [name] ([id number]) [id number] Page 7 of 10 Pain Management and Medication Current Pain Management: Electronic Signature(s) Signed: 02/10/2022 5:13:25 PM By: Rosalio Loud MSN RN CNS WTA  Entered By: Rosalio Loud on 02/10/2022 15:55:58 -------------------------------------------------------------------------------- Patient/Caregiver Education Details Patient Name: Date of Service: [name] 11/21/2023andnbsp3:15 PM Medical Record Number: [id number] Patient Account Number: [id number] Date of Birth/Gender: Treating RN: January 26, 1951 (71 y.o. [name] Primary Care Physician: Carollee Leitz Other Clinician: Referring Physician: Treating Physician/Extender: Carolan Shiver in Treatment: 0 Education Assessment Education Provided To: Patient Education Topics Provided Wound/Skin Impairment: Handouts: Caring for Your Ulcer Methods:  Explain/Verbal Responses: State content correctly Electronic Signature(s) Signed: 02/10/2022 5:13:25 PM By: Rosalio Loud MSN RN CNS WTA Entered By: Rosalio Loud on 02/10/2022 16:16:00 [name] [name] ([id number]) [id number] Page 8 of 10 -------------------------------------------------------------------------------- Wound Assessment Details Patient Name: Date of Service: [name] 02/10/2022 3:15 PM Medical Record Number: [id number] Patient Account Number: [id number] Date of Birth/Sex: Treating RN: 1950/07/01 (71 y.o. [name] Primary Care Chidubem Chaires: Carollee Leitz Other Clinician: Referring Enid Maultsby: Treating Zayli Villafuerte/Extender: Keturah Barre Weeks in Treatment: 0 Wound Status Wound Number: 22 Primary Diabetic Wound/Ulcer of the Lower Extremity Etiology: Wound Location: Right, Lateral, Anterior, Circumferential Lower Leg Wound Open Wounding Event: Gradually Appeared Status: Date Acquired: 12/06/2021 Comorbid Asthma, Pneumothorax, Arrhythmia, Hypertension, Type II Weeks Of Treatment: 0 History: Diabetes, Gout, Osteoarthritis, Neuropathy Clustered Wound: Yes Photos Wound Measurements Length: (cm) 14 Width: (cm) 11 Depth: (cm) 0.1 Area: (cm) 120.951 Volume: (cm) 12.095 % Reduction in Area: 18.8% % Reduction in Volume: 18.8% Wound Description Classification: Grade 1 Exudate Amount: Medium Exudate Type: Serous Exudate Color: amber Foul Odor After Cleansing: No Slough/Fibrino No Wound Bed Granulation Amount: Small (1-33%) Exposed Structure Granulation Quality: Red, Pink Fascia Exposed: No Fat Layer (Subcutaneous Tissue) Exposed: Yes Tendon Exposed: No Muscle Exposed: No Joint Exposed: No Bone Exposed: No Treatment Notes Wound #22 (Lower Leg) Wound Laterality: Right, Lateral, Anterior, Circumferential Cleanser Peri-Wound Care Topical MADDUX, FIRST [name] ([id number]) [id number] Page 9 of  10 Primary Dressing Secondary Dressing Secured With Compression Wrap Compression Stockings Add-Ons Electronic Signature(s) Signed: 02/10/2022 5:13:25 PM By: Rosalio Loud MSN RN CNS WTA Entered By: Rosalio Loud on 02/10/2022 16:11:49 -------------------------------------------------------------------------------- Wound Assessment Details Patient Name: Date of Service: [name], April Manson 02/10/2022 3:15 PM Medical Record Number: [id number] Patient Account Number: [id number] Date of Birth/Sex: Treating RN: 1950/11/05 (71 y.o. [name] Primary Care Arzu Mcgaughey: Carollee Leitz Other Clinician: Referring Anis Cinelli: Treating Jaylea Plourde/Extender: Keturah Barre Weeks in Treatment: 0 Wound Status Wound Number: 23 Primary Diabetic Wound/Ulcer of the Lower Extremity Etiology: Wound Location: Left, Midline Lower Leg Wound Open Wounding Event: Gradually Appeared Status: Date Acquired: 12/06/2021 Comorbid Asthma, Pneumothorax, Arrhythmia, Hypertension, Type II Weeks Of Treatment: 0 History: Diabetes, Gout, Osteoarthritis, Neuropathy Clustered Wound: No Photos Wound Measurements Length: (cm) 0.7 Width: (cm) 0.6 Depth: (cm) 0.1 Area: (cm) 0.33 Volume: (cm) 0.033 % Reduction in Area: -110.2% % Reduction in Volume: -106.2% Epithelialization: Small (1-33%) Wound Description Classification: Grade 2 Exudate Amount: Medium Exudate Type: Serous Exudate Color: amber Foul Odor After Cleansing: No Slough/Fibrino No Wound Bed Granulation Amount: Small (1-33%) Exposed KYEISHA, JANOWICZ [name] ([id number]) [id number] Page 10 of 10 Granulation Quality: Red Fascia Exposed: No Fat Layer (Subcutaneous Tissue) Exposed: No Tendon Exposed: No Muscle Exposed: No Joint Exposed: No Bone Exposed: No Treatment Notes Wound #23 (Lower Leg) Wound Laterality: Left, Midline Cleanser Peri-Wound Care Topical Primary Dressing Secondary Dressing Secured  With Compression Wrap Compression Stockings Add-Ons Electronic Signature(s) Signed: 02/10/2022 5:13:25 PM By: Rosalio Loud MSN RN CNS WTA Entered By: Rosalio Loud on 02/10/2022 16:12:25 --------------------------------------------------------------------------------  Vitals Details Patient Name: Date of Service: [name] 02/10/2022 3:15 PM Medical Record Number: [id number] Patient Account Number: [id number] Date of Birth/Sex: Treating RN: 02-Nov-1950 (71 y.o. [name] Primary Care Velta Rockholt: Carollee Leitz Other Clinician: Referring Abhijot Straughter: Treating Kristee Angus/Extender: Keturah Barre Weeks in Treatment: 0 Vital Signs Time Taken: 15:55 Temperature (F): 98.1 Height (in): 66 Pulse (bpm): 87 Weight (lbs): 263 Respiratory Rate (breaths/min): 16 Body Mass Index (BMI): 42.4 Blood Pressure (mmHg): 144/83 Reference Range: 80 - 120 mg / dl Electronic Signature(s) Signed: 02/10/2022 5:13:25 PM By: Rosalio Loud MSN RN CNS WTA Entered By: Rosalio Loud on 02/10/2022 15:55:50

## 2022-02-11 NOTE — Telephone Encounter (Signed)
Patient stated he sugars were running from 160-180 on the 0.5 mg dose of the ozempic, she stated she starts the higher dose tomorrow of the ozempic I informed her that she needed to send [place] a log of her a week after she starts the higher dose and let us know how they are and she understood. Jahzir Strohmeier,cma

## 2022-02-16 DIAGNOSIS — E1140 Type 2 diabetes mellitus with diabetic neuropathy, unspecified: Secondary | ICD-10-CM | POA: Diagnosis not present

## 2022-02-16 DIAGNOSIS — Z7901 Long term (current) use of anticoagulants: Secondary | ICD-10-CM | POA: Diagnosis not present

## 2022-02-16 DIAGNOSIS — E11621 Type 2 diabetes mellitus with foot ulcer: Secondary | ICD-10-CM | POA: Diagnosis not present

## 2022-02-16 DIAGNOSIS — M1990 Unspecified osteoarthritis, unspecified site: Secondary | ICD-10-CM | POA: Diagnosis not present

## 2022-02-16 DIAGNOSIS — Z86718 Personal history of other venous thrombosis and embolism: Secondary | ICD-10-CM | POA: Diagnosis not present

## 2022-02-16 DIAGNOSIS — I480 Paroxysmal atrial fibrillation: Secondary | ICD-10-CM | POA: Diagnosis not present

## 2022-02-16 DIAGNOSIS — I890 Lymphedema, not elsewhere classified: Secondary | ICD-10-CM | POA: Diagnosis not present

## 2022-02-16 DIAGNOSIS — I872 Venous insufficiency (chronic) (peripheral): Secondary | ICD-10-CM | POA: Diagnosis not present

## 2022-02-16 DIAGNOSIS — I87333 Chronic venous hypertension (idiopathic) with ulcer and inflammation of bilateral lower extremity: Secondary | ICD-10-CM | POA: Diagnosis not present

## 2022-02-16 DIAGNOSIS — E1151 Type 2 diabetes mellitus with diabetic peripheral angiopathy without gangrene: Secondary | ICD-10-CM | POA: Diagnosis not present

## 2022-02-16 DIAGNOSIS — L97812 Non-pressure chronic ulcer of other part of right lower leg with fat layer exposed: Secondary | ICD-10-CM | POA: Diagnosis not present

## 2022-02-16 DIAGNOSIS — S51011A Laceration without foreign body of right elbow, initial encounter: Secondary | ICD-10-CM | POA: Diagnosis not present

## 2022-02-16 DIAGNOSIS — L97822 Non-pressure chronic ulcer of other part of left lower leg with fat layer exposed: Secondary | ICD-10-CM | POA: Diagnosis not present

## 2022-02-16 DIAGNOSIS — M109 Gout, unspecified: Secondary | ICD-10-CM | POA: Diagnosis not present

## 2022-02-16 DIAGNOSIS — S51811A Laceration without foreign body of right forearm, initial encounter: Secondary | ICD-10-CM | POA: Diagnosis not present

## 2022-02-16 IMAGING — CR DG CHEST 2V
1 series · 2 of 2 positions shown · non-contrast
Comparison: 09/02/2019

CLINICAL DATA: Dysphagia, epigastric pain following endoscopy

EXAM:
CHEST - 2 VIEW

[Series 1: w chest lat · 0.14mm/px · 2 of 2 slices shown]
[im 1/2]
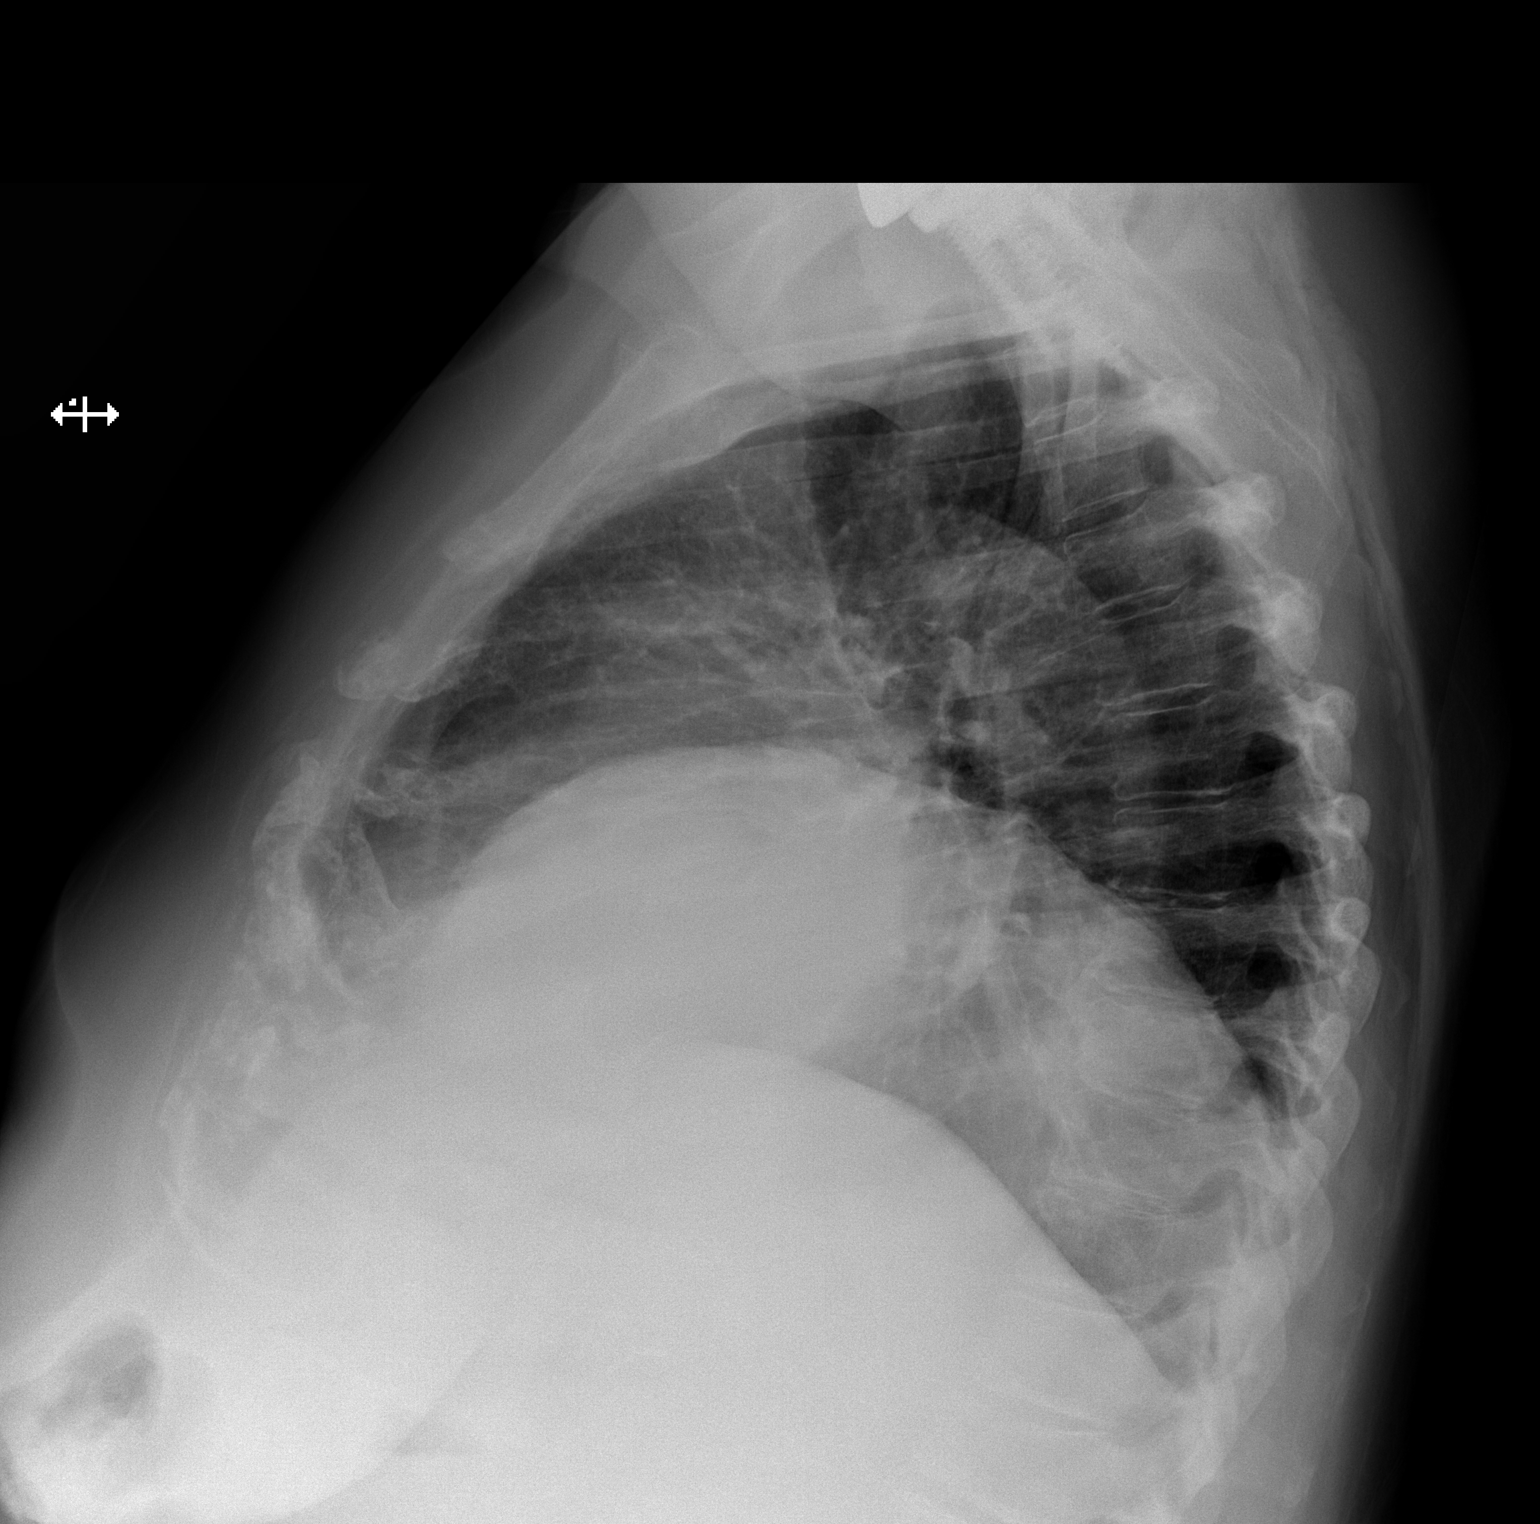
[im 2/2]
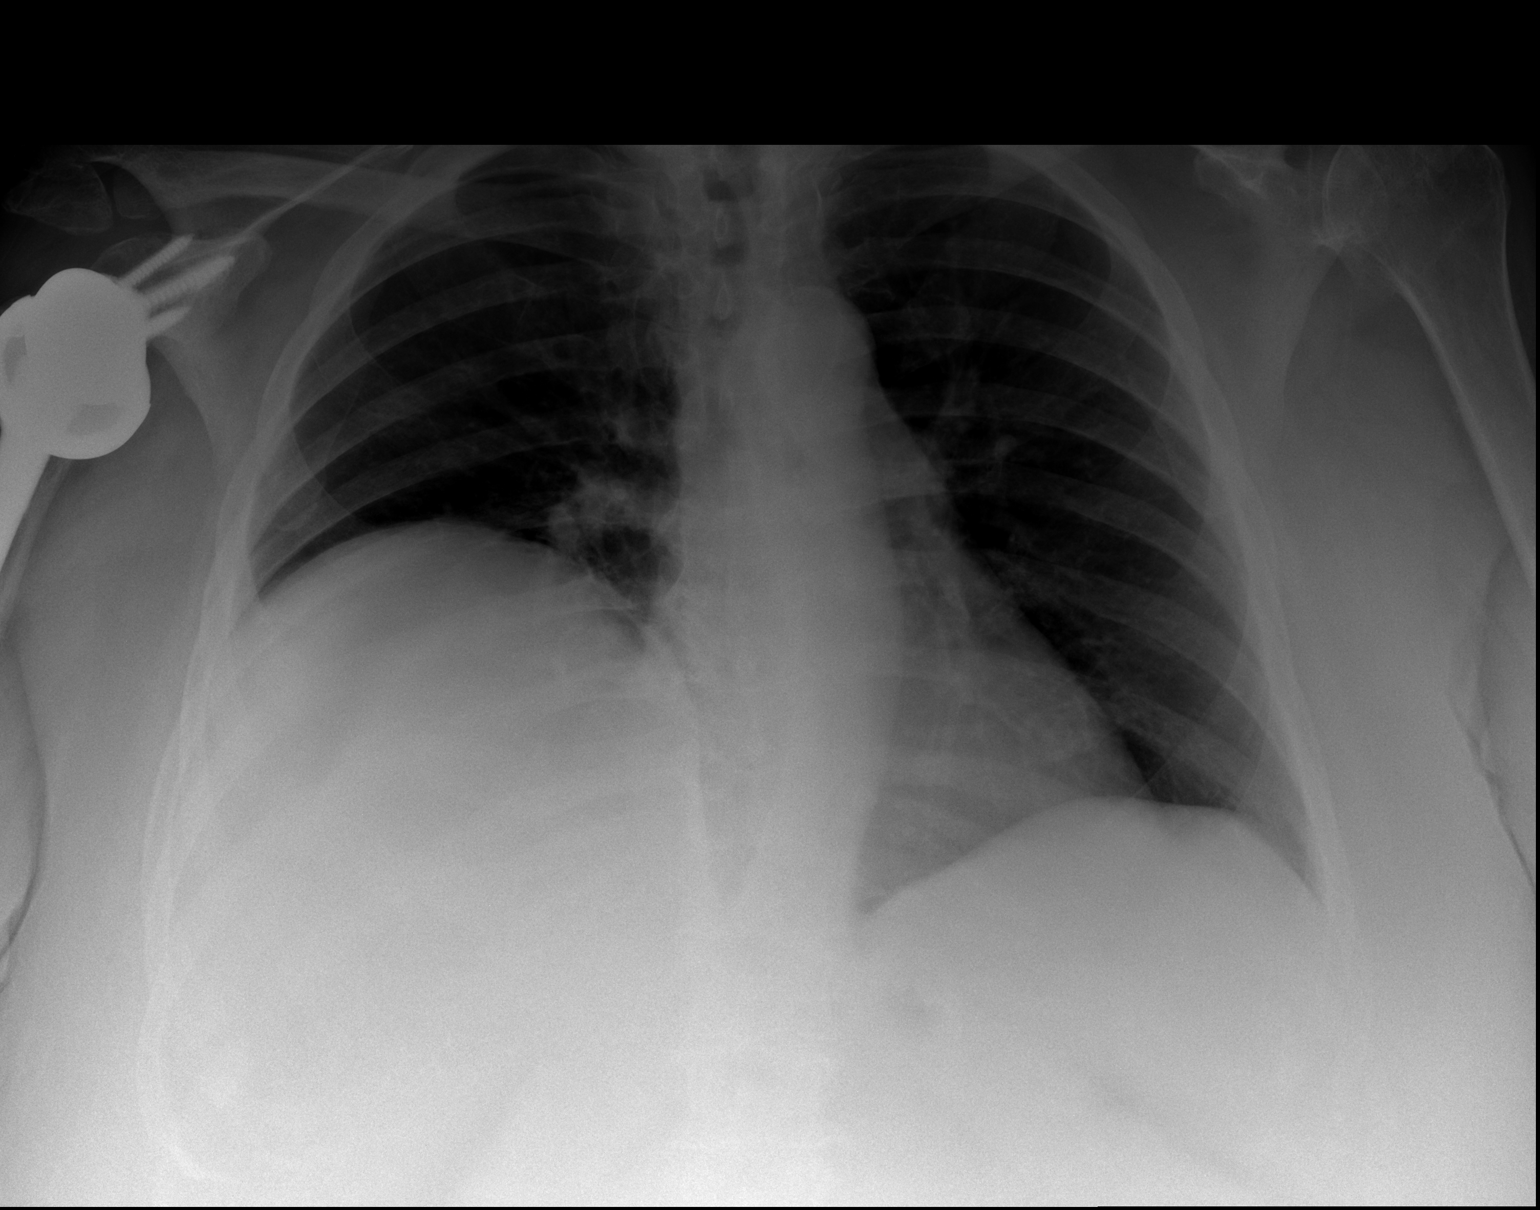

[2 of 2 positions shown; findings below may reference images not displayed]

FINDINGS: The heart size and mediastinal contours are within normal limits.
Chronic elevation of the right hemidiaphragm. No focal airspace
consolidation, pleural effusion, or pneumothorax. Reverse right
shoulder arthroplasty.
IMPRESSION: No active cardiopulmonary disease.

## 2022-02-16 IMAGING — RF DG ESOPHAGUS
5 series · 10 of 10 positions shown · non-contrast
Comparison: CT chest 09/02/2019.

CLINICAL DATA: Pain since yesterday's endoscopy.

EXAM:
ESOPHOGRAM/BARIUM SWALLOW
TECHNIQUE: Single contrast examination was performed using water-soluble
contrast.
FLUOROSCOPY TIME:  Fluoroscopy Time:  1 minutes 18 seconds
Radiation Exposure Index (if provided by the fluoroscopic device):
32.3 mGy

[Series 1: fluoro_barium 2fps_bw · 0.18mm/px · 4 of 9 frames shown (1 of 5)]
[frame 2/9]
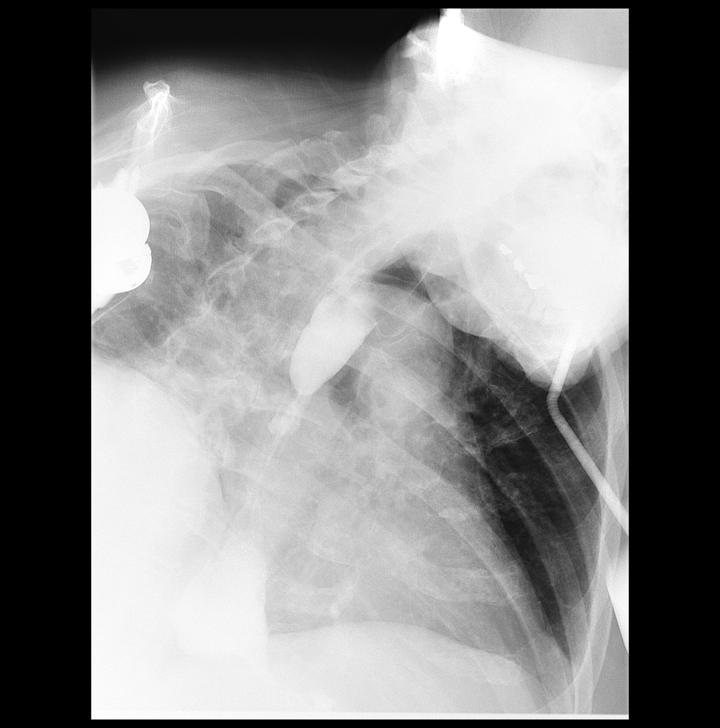
[frame 5/9]
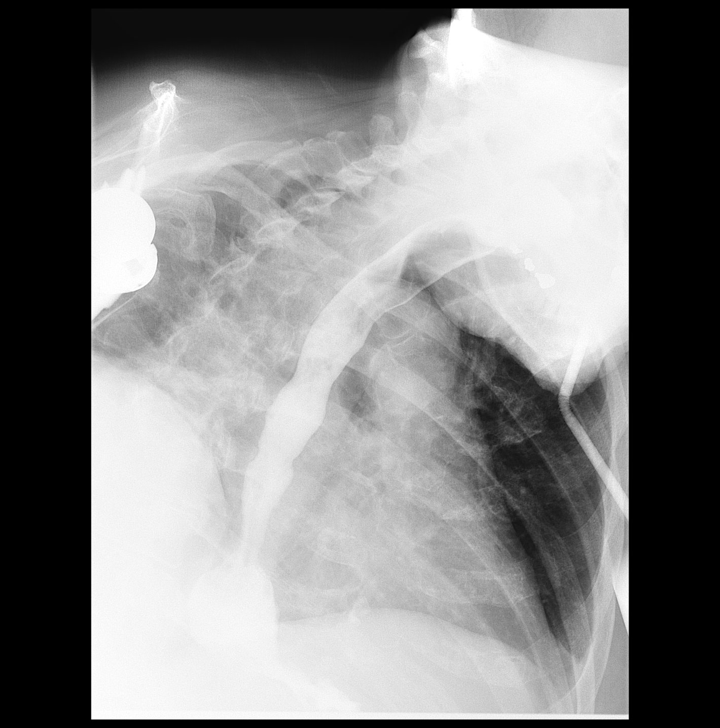
[frame 6/9]
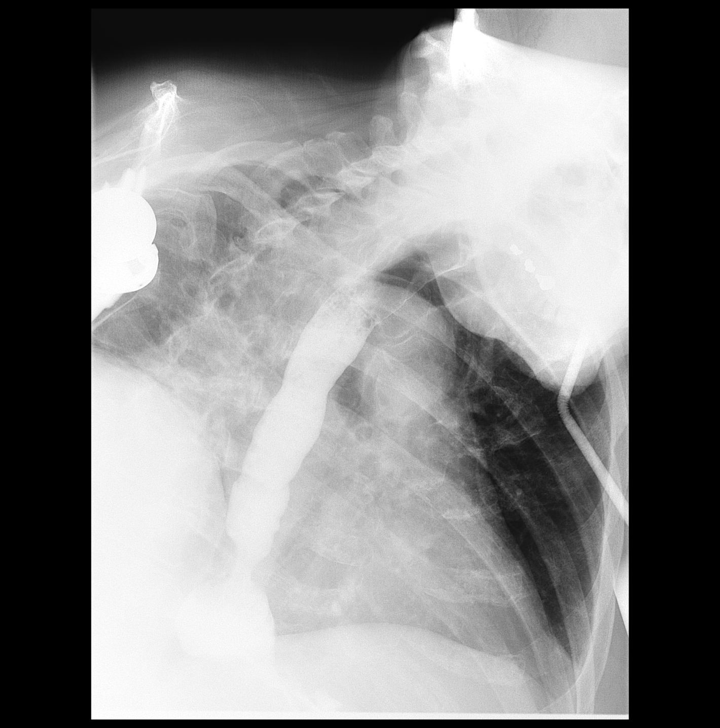
[frame 8/9]
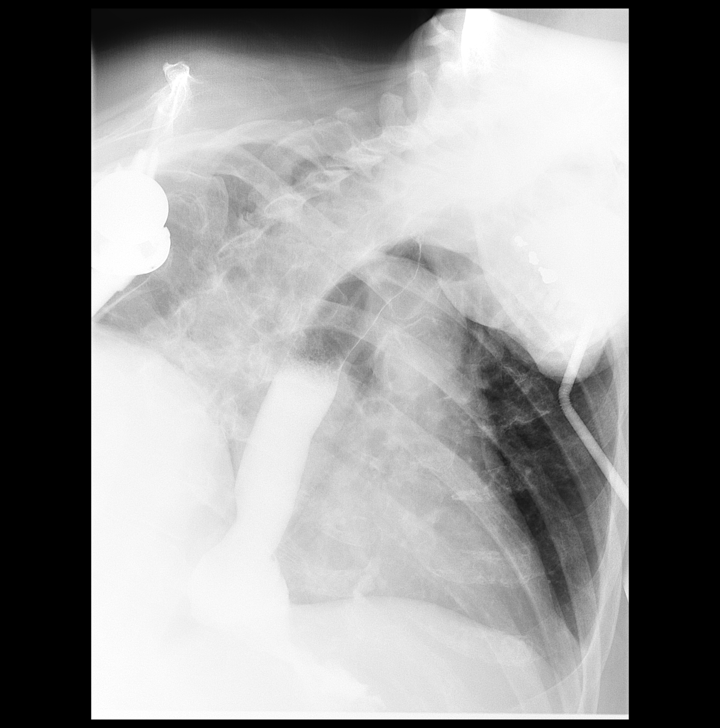

[Series 2: fluoro_barium 2fps_bw · 0.18mm/px · 2 of 2 frames shown (2 of 5)]
[frame 1/2]
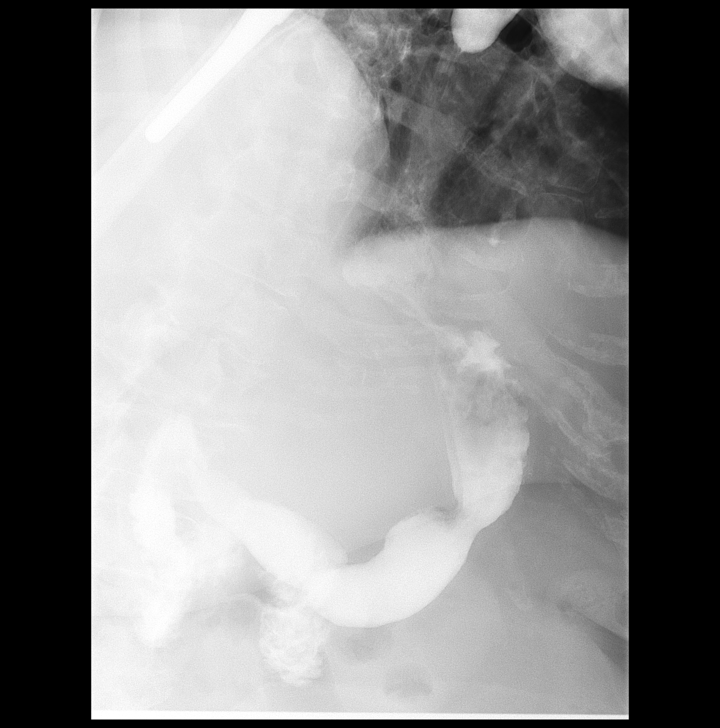
[frame 2/2]
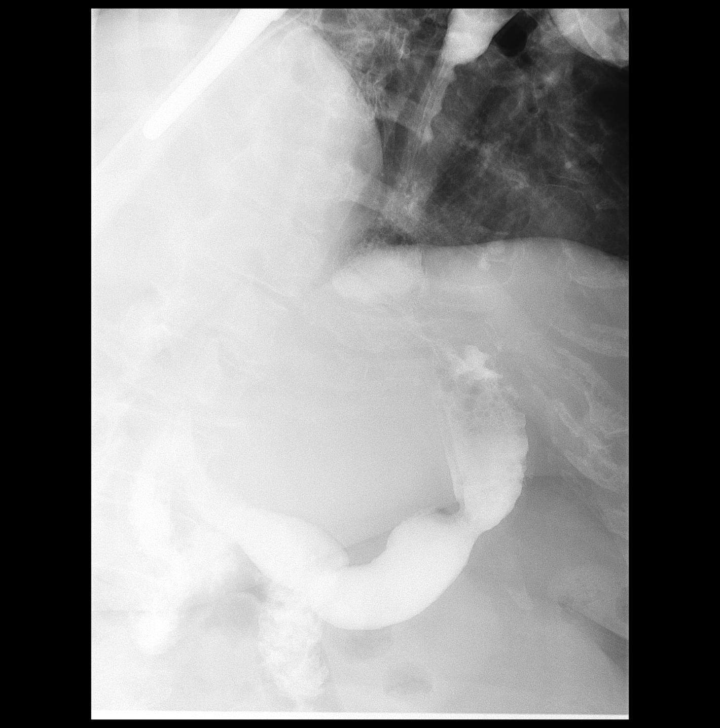

[Series 3: fluoro_barium 2fps_bw · 0.18mm/px · 1 of 1 slices shown (3 of 5)]
[im 1/1]
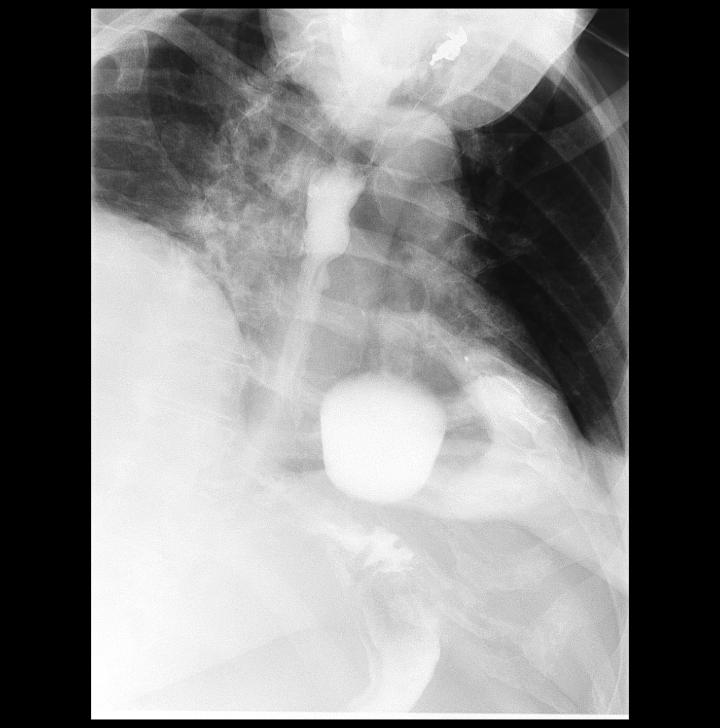

[Series 4: fluoro_barium 2fps_bw · 0.18mm/px · 2 of 2 frames shown (4 of 5)]
[frame 1/2]
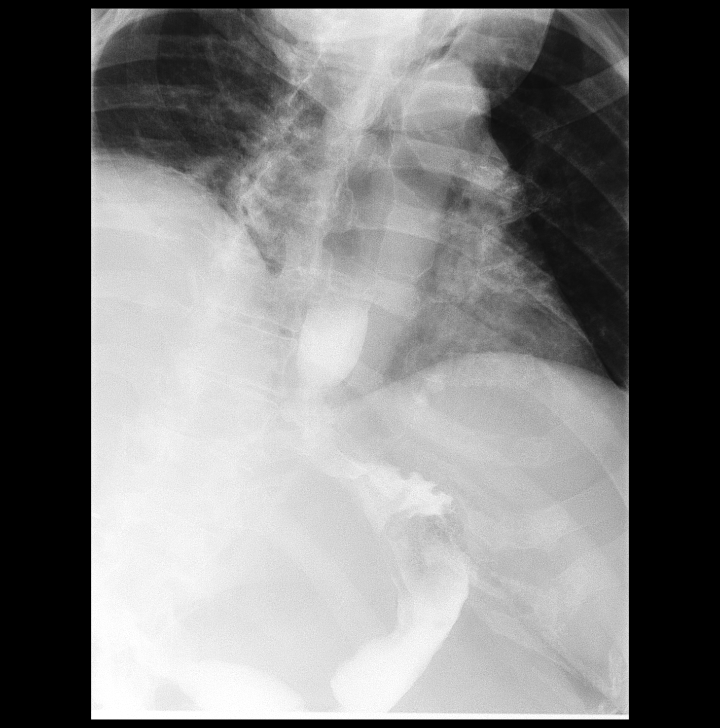
[frame 2/2]
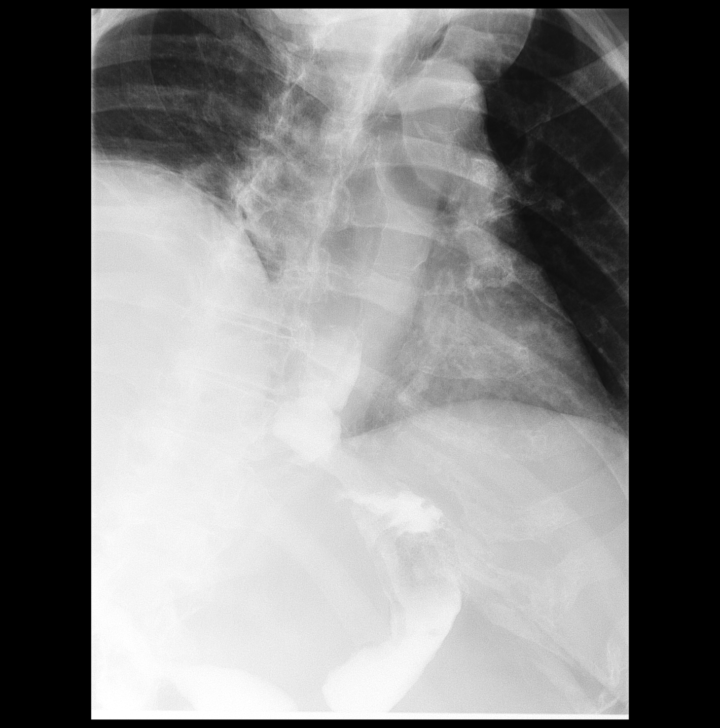

[Series 5: fluoro_barium 2fps_bw · 0.18mm/px · 1 of 1 slices shown (5 of 5)]
[im 1/1]
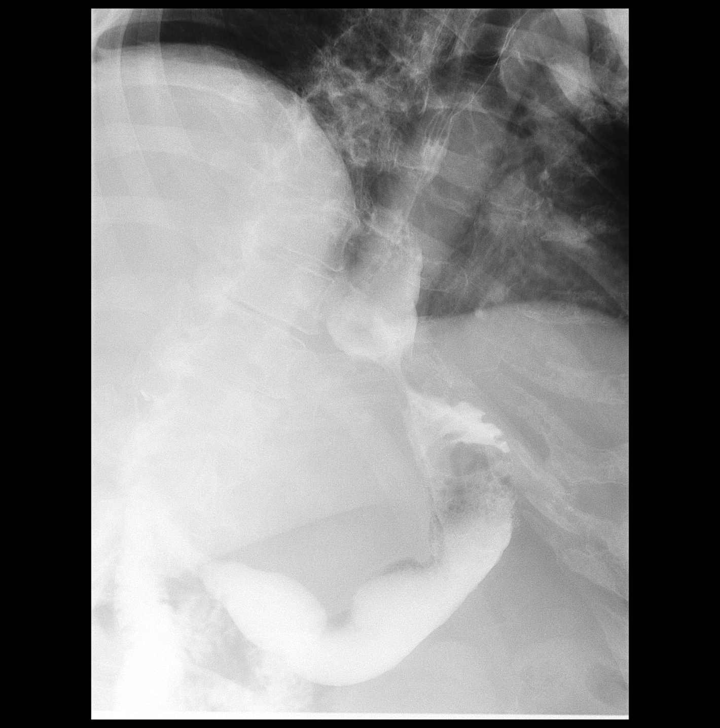

[10 of 10 positions shown; findings below may reference images not displayed]

FINDINGS: Limited exam due to patient's condition. Esophagus is widely patent.
Small hiatal hernia. No evidence of esophageal perforation. No
prominent reflux noted.
IMPRESSION: Small hiatal hernia. Exam otherwise unremarkable. No evidence of
esophageal perforation.

## 2022-02-16 NOTE — Progress Notes (Signed)
Dawn, ATKIN [name] ([id number]) [id number] Page 1 of 3 Visit Report for 02/16/2022 Arrival Information Details Patient Name: Date of Service: [name] 02/16/2022 4:00 PM Medical Record Number: [id number] Patient Account Number: [id number] Date of Birth/Sex: Treating RN: Jul 09, 1950 (71 y.o. [name] Primary Care Adriann Ballweg: Carollee Leitz Other Clinician: Referring Mahayla Haddaway: Treating Kristjan Derner/Extender: Carolan Shiver in Treatment: 1 Visit Information History Since Last Visit Added or deleted any medications: No Patient Arrived: Wheel Chair Any new allergies or adverse reactions: No Arrival Time: 16:55 Had a fall or experienced change in No Accompanied By: husband activities of daily living that may affect Transfer Assistance: None risk of falls: Patient Identification Verified: Yes Hospitalized since last visit: No Secondary Verification Process Completed: Yes Pain Present Now: No Patient Requires Transmission-Based Precautions: No Patient Has Alerts: Yes Patient Alerts: Patient on Blood Thinner DMII Eliquis Electronic Signature(s) Signed: 02/16/2022 4:55:58 PM By: Rosalio Loud MSN RN CNS WTA Entered By: Rosalio Loud on 02/16/2022 16:55:57 -------------------------------------------------------------------------------- Clinic Level of Care Assessment Details Patient Name: Date of Service: [name] 02/16/2022 4:00 PM Medical Record Number: [id number] Patient Account Number: [id number] Date of Birth/Sex: Treating RN: 08-23-1950 (71 y.o. [name] Primary Care [name]: Carollee Leitz Other Clinician: Referring [name]: Treating [name]/Extender: Carolan Shiver in Treatment: 1 Clinic Level of Care Assessment Items TOOL 4 Quantity Score X- 1 0 Use when only an EandM is performed on FOLLOW-UP visit ASSESSMENTS - Nursing Assessment / Reassessment X- 1 10 Reassessment of Co-morbidities  (includes updates in patient status) X- 1 5 Reassessment of Adherence to Treatment Plan ASSESSMENTS - Wound and Skin A ssessment / Reassessment X - Simple Wound Assessment / Reassessment - one wound 1 5 Masoner, Xenia [name] ([id number]) [phone] Page 2 of 3 '[]'$  - 0 Complex Wound Assessment / Reassessment - multiple wounds '[]'$  - 0 Dermatologic / Skin Assessment (not related to wound area) ASSESSMENTS - Focused Assessment '[]'$  - 0 Circumferential Edema Measurements - multi extremities '[]'$  - 0 Nutritional Assessment / Counseling / Intervention '[]'$  - 0 Lower Extremity Assessment (monofilament, tuning fork, pulses) '[]'$  - 0 Peripheral Arterial Disease Assessment (using hand held doppler) ASSESSMENTS - Ostomy and/or Continence Assessment and Care '[]'$  - 0 Incontinence Assessment and Management '[]'$  - 0 Ostomy Care Assessment and Management (repouching, etc.) PROCESS - Coordination of Care X - Simple Patient / Family Education for ongoing care 1 15 '[]'$  - 0 Complex (extensive) Patient / Family Education for ongoing care X- 1 10 Staff obtains Programmer, systems, Records, T Results / Process Orders est '[]'$  - 0 Staff telephones HHA, Nursing Homes / Clarify orders / etc '[]'$  - 0 Routine Transfer to another Facility (non-emergent condition) '[]'$  - 0 Routine Hospital Admission (non-emergent condition) '[]'$  - 0 New Admissions / Biomedical engineer / Ordering NPWT Apligraf, etc. , '[]'$  - 0 Emergency Hospital Admission (emergent condition) X- 1 10 Simple Discharge Coordination '[]'$  - 0 Complex (extensive) Discharge Coordination PROCESS - Special Needs '[]'$  - 0 Pediatric / Minor Patient Management '[]'$  - 0 Isolation Patient Management '[]'$  - 0 Hearing / Language / Visual special needs '[]'$  - 0 Assessment of Community assistance (transportation, D/C planning, etc.) '[]'$  - 0 Additional assistance / Altered mentation '[]'$  - 0 Support Surface(s) Assessment (bed, cushion, seat, etc.) INTERVENTIONS  - Wound Cleansing / Measurement X - Simple Wound Cleansing - one wound 1 5 '[]'$  - 0 Complex Wound Cleansing - multiple wounds '[]'$  - 0 Wound Imaging (photographs - any  number of wounds) '[]'$  - 0 Wound Tracing (instead of photographs) '[]'$  - 0 Simple Wound Measurement - one wound '[]'$  - 0 Complex Wound Measurement - multiple wounds INTERVENTIONS - Wound Dressings X - Small Wound Dressing one or multiple wounds 1 10 '[]'$  - 0 Medium Wound Dressing one or multiple wounds '[]'$  - 0 Large Wound Dressing one or multiple wounds '[]'$  - 0 Application of Medications - topical '[]'$  - 0 Application of Medications - injection INTERVENTIONS - Miscellaneous '[]'$  - 0 External ear exam '[]'$  - 0 Specimen Collection (cultures, biopsies, blood, body fluids, etc.) JOSSETTE, ZIRBEL [name] ([id number]) [phone].pdf Page 3 of 3 '[]'$  - 0 Specimen(s) / Culture(s) sent or taken to Lab for analysis '[]'$  - 0 Patient Transfer (multiple staff / Harrel Lemon Lift / Similar devices) '[]'$  - 0 Simple Staple / Suture removal (25 or less) '[]'$  - 0 Complex Staple / Suture removal (26 or more) '[]'$  - 0 Hypo / Hyperglycemic Management (close monitor of Blood Glucose) '[]'$  - 0 Ankle / Brachial Index (ABI) - do not check if billed separately '[]'$  - 0 Vital Signs Has the patient been seen at the hospital within the last three years: Yes Total Score: 70 Level Of Care: New/Established - Level 2 Electronic Signature(s) Signed: 02/16/2022 4:59:28 PM By: Rosalio Loud MSN RN CNS WTA Entered By: Rosalio Loud on 02/16/2022 16:57:50 -------------------------------------------------------------------------------- Encounter Discharge Information Details Patient Name: Date of Service: [name], Dawn Dodrill [name]. 02/16/2022 4:00 PM Medical Record Number: [id number] Patient Account Number: [id number] Date of Birth/Sex: Treating RN: 01-20-1951 (71 y.o. [name] Primary Care Miquela Costabile: Carollee Leitz Other Clinician: Referring Trev Boley: Treating  Rhea Kaelin/Extender: Carolan Shiver in Treatment: 1 Encounter Discharge Information Items Discharge Condition: Stable Ambulatory Status: Wheelchair Discharge Destination: Home Transportation: Private Auto Accompanied By: husband Schedule Follow-up Appointment: Yes Clinical Summary of Care: Electronic Signature(s) Signed: 02/16/2022 4:56:47 PM By: Rosalio Loud MSN RN CNS WTA Entered By: Rosalio Loud on 02/16/2022 16:56:47

## 2022-02-16 NOTE — Progress Notes (Signed)
ESTEPHANIA, LICCIARDI ([id number]) [id number] Page 1 of 2 Visit Report for 02/16/2022 Physician Orders Details Patient Name: Date of Service: Iva Lento 02/16/2022 4:00 PM Medical Record Number: [id number] Patient Account Number: [id number] Date of Birth/Sex: Treating RN: Jan 01, 1951 (71 y.o. [name] Primary Care Provider: Carollee Leitz Other Clinician: Referring Provider: Treating Provider/Extender: Carolan Shiver in Treatment: 1 Verbal / Phone Orders: No Diagnosis Coding Wound Treatment Electronic Signature(s) Signed: 02/16/2022 4:56:17 PM By: Rosalio Loud MSN RN CNS WTA Signed: 02/16/2022 4:57:16 PM By: Worthy Keeler PA-C Entered By: Rosalio Loud on 02/16/2022 16:56:16 -------------------------------------------------------------------------------- SuperBill Details Patient Name: Date of Service: [name], [place] 02/16/2022 Medical Record Number: [id number] Patient Account Number: [id number] Date of Birth/Sex: Treating RN: [birth year] (70 y.o. [name] Primary Care Provider: Carollee Leitz Other Clinician: Referring Provider: Treating Provider/Extender: Keturah Barre Weeks in Treatment: 1 Diagnosis Coding ICD-10 Codes Code Description [phone] Type 2 diabetes mellitus with foot ulcer I87.333 Chronic venous hypertension (idiopathic) with ulcer and inflammation of bilateral lower extremity L97.812 Non-pressure chronic ulcer of other part of right lower leg with fat layer exposed L97.822 Non-pressure chronic ulcer of other part of left lower leg with fat layer exposed Z79.01 Long term (current) use of anticoagulants I48.0 Paroxysmal atrial fibrillation Facility Procedures : [facility], [facility] Code Description: [id number] - WOUND CARE VISIT-LEV 2 EST PT Loyce B ([id number]) 12 Modifier: [phone] Quantity: 1 [id number] Page 2 of 2 : CPT4 Code Description: [id number] (Facility Use Only)  [phone] - [place] 1 Modifier: Quantity: Electronic Signature(s) Signed: 02/16/2022 4:58:27 PM By: Rosalio Loud MSN RN CNS WTA Entered By: Rosalio Loud on 02/16/2022 16:58:27

## 2022-02-23 ENCOUNTER — Other Ambulatory Visit: Payer: Self-pay | Admitting: Medical

## 2022-02-23 DIAGNOSIS — I10 Essential (primary) hypertension: Secondary | ICD-10-CM

## 2022-02-24 ENCOUNTER — Encounter: Payer: Medicare HMO | Attending: Physician Assistant | Admitting: Physician Assistant

## 2022-02-24 DIAGNOSIS — L97822 Non-pressure chronic ulcer of other part of left lower leg with fat layer exposed: Secondary | ICD-10-CM | POA: Diagnosis not present

## 2022-02-24 DIAGNOSIS — I480 Paroxysmal atrial fibrillation: Secondary | ICD-10-CM | POA: Insufficient documentation

## 2022-02-24 DIAGNOSIS — I87333 Chronic venous hypertension (idiopathic) with ulcer and inflammation of bilateral lower extremity: Secondary | ICD-10-CM | POA: Diagnosis not present

## 2022-02-24 DIAGNOSIS — I10 Essential (primary) hypertension: Secondary | ICD-10-CM | POA: Diagnosis not present

## 2022-02-24 DIAGNOSIS — E1140 Type 2 diabetes mellitus with diabetic neuropathy, unspecified: Secondary | ICD-10-CM | POA: Diagnosis not present

## 2022-02-24 DIAGNOSIS — I872 Venous insufficiency (chronic) (peripheral): Secondary | ICD-10-CM | POA: Diagnosis not present

## 2022-02-24 DIAGNOSIS — Z86718 Personal history of other venous thrombosis and embolism: Secondary | ICD-10-CM | POA: Insufficient documentation

## 2022-02-24 DIAGNOSIS — Z7901 Long term (current) use of anticoagulants: Secondary | ICD-10-CM | POA: Diagnosis not present

## 2022-02-24 DIAGNOSIS — E11622 Type 2 diabetes mellitus with other skin ulcer: Secondary | ICD-10-CM | POA: Insufficient documentation

## 2022-02-24 DIAGNOSIS — L97812 Non-pressure chronic ulcer of other part of right lower leg with fat layer exposed: Secondary | ICD-10-CM | POA: Diagnosis not present

## 2022-02-24 NOTE — Progress Notes (Addendum)
Dawn, [name] ([id number]) [id number] Page 1 of 8 Visit Report for 02/24/2022 Chief Complaint Document Details Patient Name: Date of Service: [name] 02/24/2022 3:00 PM Medical Record Number: [id number] Patient Account Number: [id number] Date of Birth/Sex: Treating RN: [birth year] (71 y.o. [name] Primary Care Provider: Carollee Leitz Other Clinician: Massie Kluver Referring Provider: Treating Provider/Extender: Keturah Barre Weeks in Treatment: 2 Information Obtained from: Patient Chief Complaint Bilateral Leg Ulcers Electronic Signature(s) Signed: 02/24/2022 3:43:04 PM By: Worthy Keeler PA-C Entered By: Worthy Keeler on 02/24/2022 15:43:03 -------------------------------------------------------------------------------- HPI Details Patient Name: Date of Service: [name], [place] [name]. 02/24/2022 3:00 PM Medical Record Number: [id number] Patient Account Number: [id number] Date of Birth/Sex: Treating RN: 03-Aug-1950 (71 y.o. [name] Primary Care Provider: Carollee Leitz Other Clinician: Massie Kluver Referring Provider: Treating Provider/Extender: Carolan Shiver in Treatment: 2 History of Present Illness HPI Description: 06/28/20 upon evaluation today patient appears to be doing unfortunately somewhat poorly in regard to wounds that she has over her lower extremities. She has a wound on the left distal second toe, left posterior heel, and right anterior lower leg. With that being said all in all none of the wounds appear to be too bad the one that hurts her the most is actually the wound on the posterior heel which actually is also one of the smallest. Nonetheless I think there is some callus hiding what is really going on in this area. Fortunately there does not appear to be any obvious evidence of infection which is great news. Patient does have a history of diabetes mellitus type 2, chronic venous  insufficiency, hypertension, history of DVT and is on long-term anticoagulant s, therapy, Eliquis. 07/05/20 upon evaluation today patient appears to be doing well currently with regard to her wounds. I feel like she is making progress which is great news and overall there is no signs of active infection at this time. No fevers, chills, nausea, vomiting, or diarrhea. 4/29; patient has a only a small area remaining on the tip of her left second toe and a small area remaining on the left heel. The area on the right anterior lower leg is healed. They tell me that she had remote podiatric surgery on the toes of the left foot however they are very fixed in position and I wonder whether they are making it difficult to relieve pressure in her foot wear. She is a type II diabetic with neuropathy as well. 08/01/2020 upon evaluation today patient appears to be doing well currently in regard to her wounds. In fact everything appears to be doing much better. The posterior aspect of her heel is actually giving her some trouble not the Achilles area but a small wound that we did not even have marked. In fact I had noted this before. Nonetheless we are going to addressed that today. 5/27; most of the patient's wounds are healed including the left Achilles heel left toe. Right forearm is also healed. She has a new abrasion posteriorly in the SIMON, [name] ([id number]) [id number] Page 2 of 8 right elbow area just above the olecranon this happened today with an abrasion from her car door 6/15; the patient's wounds on the left Achilles and left second toe remain healed. The area on the right forearm is also just about healed in fact the skin I replaced last time is looking viable which is good. She had me look at the left heel again which is  not in the area of the wound more towards the tip of the heel at the Achilles insertion site as well as the tip of the left foot second toe. In the  Achilles area that is clearly is friction probably in bed at night. Not really near where the wound was. The left second toe remains healed although this is hammered and overhanging first toe also puts pressure in this area 09/13/2020 upon evaluation today patient appears to be doing well with regard to all previous wounds that she had. Everything is completely healed. With that being said I do think that the patient unfortunately has a new skin tear on the right forearm which is due to her put a Band-Aid on it and then when it pulled off this caused some issues with skin tearing. Nonetheless I think that this needs to be addressed today. Hopefully will take too long to get this to heal. She is having some issues with pain in her heel but I think this is more related to be honest to her neuropathy as opposed to anything else. Readmission: 10/30/2020 upon evaluation today patient appears to be doing poorly in regard to her legs bilaterally. She is also having an issue with her fourth toe on the left. Fortunately there does not appear to be any signs of active infection at this time. No fevers, chills, nausea, vomiting, or diarrhea. It is actually been quite a while since we have seen her. In fact June 24 was the last time that she was here in the clinic. She tells me she did not come back because things were just doing "pretty well". 11/29/2020 upon evaluation today patient actually appears to be doing well with regard to her arms those are completely healed. Her right leg is still open but doing okay. The fourth toe of the left foot does show some signs here of having some issues with necrotic tissue of an open wound. This is where a toenail was removed by podiatry. Subsequently I do think that we need to go ahead and see what we can do about getting this cleared away so being try to get some healing going forward. 12/13/2020 upon evaluation today patient appears to be doing decently well in regard to her  wound on the right leg. In general I think that she is making good progress here. Unfortunately she continues to have significant issues with swelling in the left leg is now even more swollen at this point. For that reason I do believe she would be benefited by wrapping both sides although she is not interested in doing this. Overall I think that she is definitely making some good progress here nonetheless in regard to the right leg left leg leave some to be desired. 12/27/2020 upon evaluation today patient appears to be doing well with regard to her wound in regard to the legs. Unfortunately the toe was not doing nearly as well. I am much more concerned about infection and osteomyelitis here. She had the MRI but right now were not seeing obvious evidence of redness spreading up to her foot this is good news I do not have the result of the MRI as of yet. I am good to place her on doxycycline however due to the fact that the wound does seem to be getting a little worse in regard to her toe. 01/14/2021 upon evaluation today patient appears to be doing well with regard to her ulcers. The right leg is doing well and even the toe  seems to be doing well though she still has some evidence of bone exposed on the toe which does not appear to be doing quite as good as I would like to see just and the fact that is still not covered over new tissue but looks tremendously better compared to 2 weeks ago. Overall very pleased in that regard. 01/28/2021 upon evaluation today patient's leg on the left actually appears to have new wounds this is due to her deciding to shave yesterday. Fortunately there does not appear to be any signs of infection currently. This is good news. Nonetheless I do believe that with these new areas open it would be best to wrap her leg to try to make sure we get this under control sooner rather than later. With regard to her toe there still does appear to be some evidence of bone exposed this  was covered over with callus I did remove that today. Nonetheless I think this is significantly smaller than previous. 02/11/2021 upon evaluation today patient appears to be doing okay in regard to her leg ulcers. I do not see anything obvious at this point this seems to be a complicating factor. I think that she is getting better. Were wrap of the right leg not the left leg. With that being said the left fourth toe ulcer still does have evidence of being open there is some drainage still on top of the wound I think we can to try to see about using something like a collagen dressing today to see if that would be of benefit for her. She is in agreement with giving this a trial. Nonetheless I am concerned about the fact that she continues to have issues here with this not healing it is confirmed chronic osteomyelitis she has been on antibiotics. I am going to extend those today as well. That is Keflex and that will be for 1 additional month. Nonetheless if she is not doing significantly better by the next time I see her I think that the biggest thing is going to be for [place] to go ahead and see what we can do about getting her started with hyperbaric oxygen therapy to try to save the toe. 02/20/2021 upon evaluation today patient presents for follow-up she actually came in a little bit early as the home health nurse was concerned about her toe becoming "gangrenous". With that being said the good news is there is [place] no evidence of this and in fact the toe seems to be doing better at this point. What was over the toe was an area that had bled significantly and then subsequently dried making it look much worse than where things actually stand. There is just a very small opening remaining in the toe. In regard to her legs everything appears to be healed bilaterally. 02/24/2021 upon evaluation today patient appears to be doing well with regard to her toe ulcer. I am actually very pleased with where we stand and I  think that she is making good progress at this time. There is still a very small opening although I think the biggest issue is the collagen seems to be getting stuck and drying up because the wound is so small we probably just need to use something to keep this moist like a little piece of Xeroform gauze and see if we get this to close. 03/07/2021 upon evaluation patient actually appears to be making excellent progress in regard to her last wound on the toe. There is just a very  tiny potential area that I can even be sure at first until I get some of the dry skin off when I did get this removed there was just a very tiny pinpoint opening that was barely even moist. Actually feel like this is very close to complete resolution. She has been using the Xeroform that is doing great. 03/25/2021 upon evaluation today patient appears to be doing well with regard to her toe ulcer. In fact this appears to be potentially completely healed. Fortunately I do not see anything draining at this point which is great news. She does have a small blood blister that is already drying up and looking okay on the third toe as well I am not sure what happened here she has an either. Otherwise she is having some weeping from the right leg this again is newer she tells me from last week weighing in that her pulmonologist and then today weighing in at the hematologist that she gained 30 pounds. This is quite significant if indeed true and I think she is to contact her primary care provider ASAP. This would account for why her right leg is weeping as well. 04/04/2020 upon evaluation today patient appears to be doing excellent in regard to her toe which I actually think is healed today. With that being said she unfortunately is having issues with her leg swelling and again several blisters on each leg that have reopened unfortunately. 05/02/2021 upon evaluation today patient appears to be doing well with regard to her wounds. In fact  is down just 1 on the anterior portion of her right leg which is actually from a blister. Otherwise she is doing awesome. 05/15/2021 upon evaluation today patient appears to be doing better in regard to her leg ulcer although she has a small irritated area from tape on the leg medially. She also has a skin tear to her right elbow region. Fortunately there does not appear to be any signs of active infection locally nor systemically at this point which is great news. No fevers, chills, nausea, vomiting, or diarrhea. 05/26/2021 upon evaluation today patient appears to be doing better in regard to the actual wounds although she is having a lot of leaking from the right leg. She is also been using her lymphedema pumps along with wearing her compression. Nonetheless she is having a lot of trouble with her breathing I am concerned that the pumps may be contributing to this. 06/12/2021 upon evaluation today patient appears to be doing better in regard to a lot of things including her breathing as well as her legs. She still has a lot of swelling but the good news is this is better she lost about 20 pounds of fluid when she went to the hospital and they diuresed her. Subsequently this is good news. Nonetheless she actually told me that "if she ever gets that bad again for me to yell at her to go to the hospital". She said that I was not quite forceful enough and telling her what she needed to do. She also notes she will try to be more receptive in the future. 06/19/2021 upon evaluation today patient appears to be doing well with regard to her wound. She is tolerating the dressing changes without complication. Fortunately her legs on the right are completely healed. She did have an area that she hit her Achilles on the left but this too seems to be pretty much closed at this point. MADIHA, BAMBRICK ([id number]) [id number] Page 3 of 8  Readmission: 02-05-2022 upon evaluation today patient  appears to be doing well currently in regard to her left leg although her right leg is actually giving her quite a bit more trouble at this point. Fortunately there does not appear to be any signs of infection locally or systemically which is great news. No fevers, chills, nausea, vomiting, or diarrhea. Patient tells me that she began having issues more recently with her legs in the right leg is definitely worse than the left as far as the way she feels as well and she states it was around September 16 that she first noted this and then when she called it took about 2-1/2 weeks to get in to see me. Her past medical history really has not changed since I saw her back in March everything is pretty much about the same repeat ABIs appear to be doing well currently at 1.1 on the left and 1.12 on the right. 02-10-2022 upon evaluation today patient actually appears to be doing well. Culture actually showed evidence of Pseudomonas but her legs look amazing today I do not see any signs of infection I do not believe that we need to add anything especially in light of the fact there is a lot of interactions with anything to treat the Pseudomonas in her current medication regimen. For that reason we will just hold with where we stand and continue with the current therapy. 02-24-2022 upon evaluation today patient appears to be doing excellent in fact she appears to be completely healed based on what we are seeing. Electronic Signature(s) Signed: 02/24/2022 4:23:59 PM By: Worthy Keeler PA-C Entered By: Worthy Keeler on 02/24/2022 16:23:59 -------------------------------------------------------------------------------- Physical Exam Details Patient Name: Date of Service: Dawn Elise Benne 02/24/2022 3:00 PM Medical Record Number: [id number] Patient Account Number: [id number] Date of Birth/Sex: Treating RN: 10-26-1950 (71 y.o. [name] Primary Care Provider: Carollee Leitz Other Clinician: Massie Kluver Referring Provider: Treating Provider/Extender: Keturah Barre Weeks in Treatment: 2 Constitutional Well-nourished and well-hydrated in no acute distress. Respiratory normal breathing without difficulty. Psychiatric this patient is able to make decisions and demonstrates good insight into disease process. Alert and Oriented x 3. pleasant and cooperative. Notes Patient's wounds currently are showing signs of excellent improvement and completed epithelization and very pleased with where we stand I do not see any signs of infection which is great news. No fevers, chills, nausea, vomiting, or diarrhea. Electronic Signature(s) Signed: 02/24/2022 4:24:19 PM By: Worthy Keeler PA-C Entered By: Worthy Keeler on 02/24/2022 16:24:19 Physician Orders Details -------------------------------------------------------------------------------- [name] ([id number]) [id number] Page 4 of 8 Patient Name: Date of Service: [name] 02/24/2022 3:00 PM Medical Record Number: [id number] Patient Account Number: [id number] Date of Birth/Sex: Treating RN: Dec 14, 1950 (71 y.o. Charolette Forward, Kim Primary Care Provider: Carollee Leitz Other Clinician: Massie Kluver Referring Provider: Treating Provider/Extender: Carolan Shiver in Treatment: 2 Verbal / Phone Orders: No Diagnosis Coding ICD-10 Coding Code Description E11.621 Type 2 diabetes mellitus with foot ulcer I87.333 Chronic venous hypertension (idiopathic) with ulcer and inflammation of bilateral lower extremity L97.812 Non-pressure chronic ulcer of other part of right lower leg with fat layer exposed L97.822 Non-pressure chronic ulcer of other part of left lower leg with fat layer exposed Z79.01 Long term (current) use of anticoagulants I48.0 Paroxysmal atrial fibrillation Discharge From [facility] Discharge from [place]! your  wound has healed. Please call if any further issues  arise Wear compression garments daily. Put garments on first thing when you wake up and remove them before bed. Moisturize legs daily after removing compression garments. Edema Control - Lymphedema / Segmental Compressive Device / Other Elevate, Exercise Daily and A void Standing for Long Periods of Time. Elevate legs to the level of the heart and pump ankles as often as possible Electronic Signature(s) Signed: 02/24/2022 4:28:12 PM By: Worthy Keeler PA-C Signed: 02/27/2022 10:21:17 AM By: Massie Kluver Entered By: Massie Kluver on 02/24/2022 16:10:47 -------------------------------------------------------------------------------- Problem List Details Patient Name: Date of Service: [name], Latrelle Dodrill [name]. 02/24/2022 3:00 PM Medical Record Number: [id number] Patient Account Number: [id number] Date of Birth/Sex: Treating RN: 1950-06-21 (71 y.o. Charolette Forward, Kim Primary Care Provider: Carollee Leitz Other Clinician: Massie Kluver Referring Provider: Treating Provider/Extender: Keturah Barre Weeks in Treatment: 2 Active Problems ICD-10 Encounter Code Description Active Date MDM Diagnosis E11.621 Type 2 diabetes mellitus with foot ulcer 02/05/2022 No Yes I87.333 Chronic venous hypertension (idiopathic) with ulcer and inflammation of 02/05/2022 No Yes bilateral lower extremity L97.812 Non-pressure chronic ulcer of other part of right lower leg with fat layer 02/05/2022 No Yes exposed HALANA, DEISHER [name] ([id number]) [id number] Page 5 of 8 [phone] Non-pressure chronic ulcer of other part of left lower leg with fat layer exposed11/16/2023 No Yes Z79.01 Long term (current) use of anticoagulants 02/05/2022 No Yes I48.0 Paroxysmal atrial fibrillation 02/05/2022 No Yes Inactive Problems Resolved Problems Electronic Signature(s) Signed: 02/24/2022 3:39:42 PM By: Worthy Keeler PA-C Entered By: Worthy Keeler  on 02/24/2022 15:39:42 -------------------------------------------------------------------------------- Progress Note Details Patient Name: Date of Service: [name], [place] 02/24/2022 3:00 PM Medical Record Number: [id number] Patient Account Number: [id number] Date of Birth/Sex: Treating RN: 1950-12-05 (71 y.o. [name] Primary Care Provider: Carollee Leitz Other Clinician: Massie Kluver Referring Provider: Treating Provider/Extender: Keturah Barre Weeks in Treatment: 2 Subjective Chief Complaint Information obtained from Patient Bilateral Leg Ulcers History of Present Illness (HPI) 06/28/20 upon evaluation today patient appears to be doing unfortunately somewhat poorly in regard to wounds that she has over her lower extremities. She has a wound on the left distal second toe, left posterior heel, and right anterior lower leg. With that being said all in all none of the wounds appear to be too bad the one that hurts her the most is actually the wound on the posterior heel which actually is also one of the smallest. Nonetheless I think there is some callus hiding what is really going on in this area. Fortunately there does not appear to be any obvious evidence of infection which is great news. Patient does have a history of diabetes mellitus type 2, chronic venous insufficiency, hypertension, history of DVT and is on long-term anticoagulant therapy, Eliquis. s, 07/05/20 upon evaluation today patient appears to be doing well currently with regard to her wounds. I feel like she is making progress which is great news and overall there is no signs of active infection at this time. No fevers, chills, nausea, vomiting, or diarrhea. 4/29; patient has a only a small area remaining on the tip of her left second toe and a small area remaining on the left heel. The area on the right anterior lower leg is healed. They tell me that she had remote podiatric surgery on the toes of the left  foot however they are very fixed in position and I wonder whether they are making it difficult to relieve pressure in her foot wear. She is  a type II diabetic with neuropathy as well. 08/01/2020 upon evaluation today patient appears to be doing well currently in regard to her wounds. In fact everything appears to be doing much better. The posterior aspect of her heel is actually giving her some trouble not the Achilles area but a small wound that we did not even have marked. In fact I had noted this before. Nonetheless we are going to addressed that today. 5/27; most of the patient's wounds are healed including the left Achilles heel left toe. Right forearm is also healed. She has a new abrasion posteriorly in the right elbow area just above the olecranon this happened today with an abrasion from her car door 6/15; the patient's wounds on the left Achilles and left second toe remain healed. The area on the right forearm is also just about healed in fact the skin I replaced last time is looking viable which is good. She had me look at the left heel again which is not in the area of the wound more towards the tip of the heel at the Achilles insertion site as well as the tip of the left foot second toe. In the Achilles area that is clearly is friction probably in bed at night. Not really near where the wound was. The left second toe remains healed although this is hammered and overhanging first toe also puts pressure in this area 09/13/2020 upon evaluation today patient appears to be doing well with regard to all previous wounds that she had. Everything is completely healed. With that being said I do think that the patient unfortunately has a new skin tear on the right forearm which is due to her put a Band-Aid on it and then when it pulled off this caused some issues with skin tearing. Nonetheless I think that this needs to be addressed today. Hopefully will take too long to get this to heal. She is having  some issues with pain in her heel but I think this is more related to be honest to her neuropathy as opposed to anything else. MELDA, MERMELSTEIN ([id number]) [id number] Page 6 of 8 Readmission: 10/30/2020 upon evaluation today patient appears to be doing poorly in regard to her legs bilaterally. She is also having an issue with her fourth toe on the left. Fortunately there does not appear to be any signs of active infection at this time. No fevers, chills, nausea, vomiting, or diarrhea. It is actually been quite a while since we have seen her. In fact June 24 was the last time that she was here in the clinic. She tells me she did not come back because things were just doing "pretty well". 11/29/2020 upon evaluation today patient actually appears to be doing well with regard to her arms those are completely healed. Her right leg is still open but doing okay. The fourth toe of the left foot does show some signs here of having some issues with necrotic tissue of an open wound. This is where a toenail was removed by podiatry. Subsequently I do think that we need to go ahead and see what we can do about getting this cleared away so being try to get some healing going forward. 12/13/2020 upon evaluation today patient appears to be doing decently well in regard to her wound on the right leg. In general I think that she is making good progress here. Unfortunately she continues to have significant issues with swelling in the left leg is now even more swollen at  this point. For that reason I do believe she would be benefited by wrapping both sides although she is not interested in doing this. Overall I think that she is definitely making some good progress here nonetheless in regard to the right leg left leg leave some to be desired. 12/27/2020 upon evaluation today patient appears to be doing well with regard to her wound in regard to the legs. Unfortunately the toe was not doing nearly  as well. I am much more concerned about infection and osteomyelitis here. She had the MRI but right now were not seeing obvious evidence of redness spreading up to her foot this is good news I do not have the result of the MRI as of yet. I am good to place her on doxycycline however due to the fact that the wound does seem to be getting a little worse in regard to her toe. 01/14/2021 upon evaluation today patient appears to be doing well with regard to her ulcers. The right leg is doing well and even the toe seems to be doing well though she still has some evidence of bone exposed on the toe which does not appear to be doing quite as good as I would like to see just and the fact that is still not covered over new tissue but looks tremendously better compared to 2 weeks ago. Overall very pleased in that regard. 01/28/2021 upon evaluation today patient's leg on the left actually appears to have new wounds this is due to her deciding to shave yesterday. Fortunately there does not appear to be any signs of infection currently. This is good news. Nonetheless I do believe that with these new areas open it would be best to wrap her leg to try to make sure we get this under control sooner rather than later. With regard to her toe there still does appear to be some evidence of bone exposed this was covered over with callus I did remove that today. Nonetheless I think this is significantly smaller than previous. 02/11/2021 upon evaluation today patient appears to be doing okay in regard to her leg ulcers. I do not see anything obvious at this point this seems to be a complicating factor. I think that she is getting better. Were wrap of the right leg not the left leg. With that being said the left fourth toe ulcer still does have evidence of being open there is some drainage still on top of the wound I think we can to try to see about using something like a collagen dressing today to see if that would be of  benefit for her. She is in agreement with giving this a trial. Nonetheless I am concerned about the fact that she continues to have issues here with this not healing it is confirmed chronic osteomyelitis she has been on antibiotics. I am going to extend those today as well. That is Keflex and that will be for 1 additional month. Nonetheless if she is not doing significantly better by the next time I see her I think that the biggest thing is going to be for [place] to go ahead and see what we can do about getting her started with hyperbaric oxygen therapy to try to save the toe. 02/20/2021 upon evaluation today patient presents for follow-up she actually came in a little bit early as the home health nurse was concerned about her toe becoming "gangrenous". With that being said the good news is there is [place] no evidence of this and  in fact the toe seems to be doing better at this point. What was over the toe was an area that had bled significantly and then subsequently dried making it look much worse than where things actually stand. There is just a very small opening remaining in the toe. In regard to her legs everything appears to be healed bilaterally. 02/24/2021 upon evaluation today patient appears to be doing well with regard to her toe ulcer. I am actually very pleased with where we stand and I think that she is making good progress at this time. There is still a very small opening although I think the biggest issue is the collagen seems to be getting stuck and drying up because the wound is so small we probably just need to use something to keep this moist like a little piece of Xeroform gauze and see if we get this to close. 03/07/2021 upon evaluation patient actually appears to be making excellent progress in regard to her last wound on the toe. There is just a very tiny potential area that I can even be sure at first until I get some of the dry skin off when I did get this removed there was just a  very tiny pinpoint opening that was barely even moist. Actually feel like this is very close to complete resolution. She has been using the Xeroform that is doing great. 03/25/2021 upon evaluation today patient appears to be doing well with regard to her toe ulcer. In fact this appears to be potentially completely healed. Fortunately I do not see anything draining at this point which is great news. She does have a small blood blister that is already drying up and looking okay on the third toe as well I am not sure what happened here she has an either. Otherwise she is having some weeping from the right leg this again is newer she tells me from last week weighing in that her pulmonologist and then today weighing in at the hematologist that she gained 30 pounds. This is quite significant if indeed true and I think she is to contact her primary care provider ASAP. This would account for why her right leg is weeping as well. 04/04/2020 upon evaluation today patient appears to be doing excellent in regard to her toe which I actually think is healed today. With that being said she unfortunately is having issues with her leg swelling and again several blisters on each leg that have reopened unfortunately. 05/02/2021 upon evaluation today patient appears to be doing well with regard to her wounds. In fact is down just 1 on the anterior portion of her right leg which is actually from a blister. Otherwise she is doing awesome. 05/15/2021 upon evaluation today patient appears to be doing better in regard to her leg ulcer although she has a small irritated area from tape on the leg medially. She also has a skin tear to her right elbow region. Fortunately there does not appear to be any signs of active infection locally nor systemically at this point which is great news. No fevers, chills, nausea, vomiting, or diarrhea. 05/26/2021 upon evaluation today patient appears to be doing better in regard to the actual wounds  although she is having a lot of leaking from the right leg. She is also been using her lymphedema pumps along with wearing her compression. Nonetheless she is having a lot of trouble with her breathing I am concerned that the pumps may be contributing to this. 06/12/2021 upon evaluation  today patient appears to be doing better in regard to a lot of things including her breathing as well as her legs. She still has a lot of swelling but the good news is this is better she lost about 20 pounds of fluid when she went to the hospital and they diuresed her. Subsequently this is good news. Nonetheless she actually told me that "if she ever gets that bad again for me to yell at her to go to the hospital". She said that I was not quite forceful enough and telling her what she needed to do. She also notes she will try to be more receptive in the future. 06/19/2021 upon evaluation today patient appears to be doing well with regard to her wound. She is tolerating the dressing changes without complication. Fortunately her legs on the right are completely healed. She did have an area that she hit her Achilles on the left but this too seems to be pretty much closed at this point. Readmission: 02-05-2022 upon evaluation today patient appears to be doing well currently in regard to her left leg although her right leg is actually giving her quite a bit more trouble at this point. Fortunately there does not appear to be any signs of infection locally or systemically which is great news. No fevers, chills, nausea, vomiting, or diarrhea. Patient tells me that she began having issues more recently with her legs in the right leg is definitely worse than the left as far as the way she feels as well and she states it was around September 16 that she first noted this and then when she called it took about 2-1/2 weeks to get in to see me. Her past medical history really has not changed since I saw her back in March everything is  pretty much about the same repeat ABIs appear to be doing well JAMELAH, SITZER [name] ([id number]) [id number] Page 7 of 8 currently at 1.1 on the left and 1.12 on the right. 02-10-2022 upon evaluation today patient actually appears to be doing well. Culture actually showed evidence of Pseudomonas but her legs look amazing today I do not see any signs of infection I do not believe that we need to add anything especially in light of the fact there is a lot of interactions with anything to treat the Pseudomonas in her current medication regimen. For that reason we will just hold with where we stand and continue with the current therapy. 02-24-2022 upon evaluation today patient appears to be doing excellent in fact she appears to be completely healed based on what we are seeing. Objective Constitutional Well-nourished and well-hydrated in no acute distress. Vitals Time Taken: 3:36 PM, Height: 66 in, Weight: 263 lbs, BMI: 42.4, Temperature: 98.1 F, Pulse: 80 bpm, Respiratory Rate: 18 breaths/min, Blood Pressure: 146/73 mmHg. Respiratory normal breathing without difficulty. Psychiatric this patient is able to make decisions and demonstrates good insight into disease process. Alert and Oriented x 3. pleasant and cooperative. General Notes: Patient's wounds currently are showing signs of excellent improvement and completed epithelization and very pleased with where we stand I do not see any signs of infection which is great news. No fevers, chills, nausea, vomiting, or diarrhea. Integumentary (Hair, Skin) Wound #22 status is Healed - Epithelialized. Original cause of wound was Gradually Appeared. The date acquired was: 12/06/2021. The wound has been in treatment 2 weeks. The wound is located on the Right,Lateral,Anterior,Circumferential Lower Leg. The wound measures 0cm length x 0cm width x 0cm depth; 0cm^2  area and 0cm^3 volume. There is Fat Layer (Subcutaneous Tissue) exposed.  There is no tunneling or undermining noted. There is a none present amount of drainage noted. There is small (1-33%) red, pink granulation within the wound bed. Wound #23 status is Healed - Epithelialized. Original cause of wound was Gradually Appeared. The date acquired was: 12/06/2021. The wound has been in treatment 2 weeks. The wound is located on the Left,Midline Lower Leg. The wound measures 0cm length x 0cm width x 0cm depth; 0cm^2 area and 0cm^3 volume. There is no tunneling or undermining noted. There is a none present amount of drainage noted. There is small (1-33%) red granulation within the wound bed. Assessment Active Problems ICD-10 Type 2 diabetes mellitus with foot ulcer Chronic venous hypertension (idiopathic) with ulcer and inflammation of bilateral lower extremity Non-pressure chronic ulcer of other part of right lower leg with fat layer exposed Non-pressure chronic ulcer of other part of left lower leg with fat layer exposed Long term (current) use of anticoagulants Paroxysmal atrial fibrillation Plan Discharge From [facility] Services: Discharge from [facility]! your wound has healed. Please call if any further issues arise Wear compression garments daily. Put garments on first thing when you wake up and remove them before bed. Moisturize legs daily after removing compression garments. Edema Control - Lymphedema / Segmental Compressive Device / Other: Elevate, Exercise Daily and Avoid Standing for Long Periods of Time. Elevate legs to the level of the heart and pump ankles as often as possible 1. Based on what I am seeing I do believe the patient is ready for discharge currently. I do believe that she is making good progress and I am very pleased in that regard. With that being said I feel like she needs to continue to wear her own compression socks at home and she is going to get some new ones as the current ones are somewhat loose  and are about a year old. 2. Also recommend that she needs to elevate her legs much as possible when she is sitting try to keep pressure off of the heels as well as helping with edema control. We will see patient back for reevaluation in 1 week here in the clinic. If anything worsens or changes patient will contact our office for additional recommendations. KAELEN, BRENNAN ([id number]) [id number] Page 8 of 8 Electronic Signature(s) Signed: 02/24/2022 4:25:05 PM By: Worthy Keeler PA-C Entered By: Worthy Keeler on 02/24/2022 16:25:05 -------------------------------------------------------------------------------- SuperBill Details Patient Name: Date of Service: [name], [place] 02/24/2022 Medical Record Number: [id number] Patient Account Number: [id number] Date of Birth/Sex: Treating RN: [birth year] (71 y.o. Charolette Forward, Kim Primary Care Provider: Carollee Leitz Other Clinician: Massie Kluver Referring Provider: Treating Provider/Extender: Keturah Barre Weeks in Treatment: 2 Diagnosis Coding ICD-10 Codes Code Description 2404112086 Type 2 diabetes mellitus with foot ulcer I87.333 Chronic venous hypertension (idiopathic) with ulcer and inflammation of bilateral lower extremity L97.812 Non-pressure chronic ulcer of other part of right lower leg with fat layer exposed L97.822 Non-pressure chronic ulcer of other part of left lower leg with fat layer exposed Z79.01 Long term (current) use of anticoagulants I48.0 Paroxysmal atrial fibrillation Facility Procedures : CPT4 Code: [id number] Description: 99213 - WOUND CARE VISIT-LEV 3 EST PT Modifier: Quantity: 1 Physician Procedures : CPT4 Code Description Modifier [id number] - WC PHYS LEVEL 3 - EST PT ICD-10 Diagnosis Description E11.621 Type 2 diabetes mellitus with foot ulcer I87.333 Chronic venous hypertension (idiopathic) with  ulcer and inflammation of bilateral lower  extremity L97.812 Non-pressure  chronic ulcer of other part of right lower leg with fat layer exposed L97.822 Non-pressure chronic ulcer of other part of left lower leg with fat layer exposed Quantity: 1 Electronic Signature(s) Signed: 02/24/2022 4:25:29 PM By: Worthy Keeler PA-C Entered By: Worthy Keeler on 02/24/2022 16:25:29

## 2022-02-27 NOTE — Progress Notes (Signed)
Dawn, [name] ([id number]) [id number] Page 1 of 10 Visit Report for 02/24/2022 Arrival Information Details Patient Name: Date of Service: [name] 02/24/2022 3:00 PM Medical Record Number: [id number] Patient Account Number: [id number] Date of Birth/Sex: Treating RN: 10/29/1950 (71 y.o. [name], Kim Primary Care Burle Kwan: Carollee Leitz Other Clinician: Massie Kluver Referring Lavora Brisbon: Treating Abram Sax/Extender: Carolan Shiver in Treatment: 2 Visit Information History Since Last Visit All ordered tests and consults were completed: No Patient Arrived: Wheel Chair Added or deleted any medications: No Arrival Time: 15:27 Any new allergies or adverse reactions: No Transfer Assistance: None Had a fall or experienced change in No Patient Identification Verified: Yes activities of daily living that may affect Secondary Verification Process Completed: Yes risk of falls: Patient Requires Transmission-Based Precautions: No Signs or symptoms of abuse/neglect since last visito No Patient Has Alerts: Yes Hospitalized since last visit: No Patient Alerts: Patient on Blood Thinner Implantable device outside of the clinic excluding No DMII cellular tissue based products placed in the center Eliquis since last visit: Pain Present Now: No Electronic Signature(s) Signed: 02/27/2022 10:21:17 AM By: Massie Kluver Entered By: Massie Kluver on 02/24/2022 15:34:37 -------------------------------------------------------------------------------- Clinic Level of Care Assessment Details Patient Name: Date of Service: [name] 02/24/2022 3:00 PM Medical Record Number: [id number] Patient Account Number: [id number] Date of Birth/Sex: Treating RN: 1950/07/23 (71 y.o. [name] Primary Care [name]: Carollee Leitz Other Clinician: Massie Kluver Referring [name]: Treating [name]/Extender: Carolan Shiver in  Treatment: 2 Clinic Level of Care Assessment Items TOOL 4 Quantity Score '[]'$  - 0 Use when only an EandM is performed on FOLLOW-UP visit ASSESSMENTS - Nursing Assessment / Reassessment X- 1 10 Reassessment of Co-morbidities (includes updates in patient status) X- 1 5 Reassessment of Adherence to Treatment Plan QUANTIA, GRULLON B ([id number]) [id number] Page 2 of 10 ASSESSMENTS - Wound and Skin A ssessment / Reassessment '[]'$  - Simple Wound Assessment / Reassessment - one wound 0 X- 2 5 Complex Wound Assessment / Reassessment - multiple wounds '[]'$  - 0 Dermatologic / Skin Assessment (not related to wound area) ASSESSMENTS - Focused Assessment X- 2 5 Circumferential Edema Measurements - multi extremities '[]'$  - 0 Nutritional Assessment / Counseling / Intervention '[]'$  - 0 Lower Extremity Assessment (monofilament, tuning fork, pulses) '[]'$  - 0 Peripheral Arterial Disease Assessment (using hand held doppler) ASSESSMENTS - Ostomy and/or Continence Assessment and Care '[]'$  - 0 Incontinence Assessment and Management '[]'$  - 0 Ostomy Care Assessment and Management (repouching, etc.) PROCESS - Coordination of Care X - Simple Patient / Family Education for ongoing care 1 15 '[]'$  - 0 Complex (extensive) Patient / Family Education for ongoing care '[]'$  - 0 Staff obtains Programmer, systems, Records, T Results / Process Orders est '[]'$  - 0 Staff telephones HHA, Nursing Homes / Clarify orders / etc '[]'$  - 0 Routine Transfer to another Facility (non-emergent condition) '[]'$  - 0 Routine Hospital Admission (non-emergent condition) '[]'$  - 0 New Admissions / Biomedical engineer / Ordering NPWT Apligraf, etc. , '[]'$  - 0 Emergency Hospital Admission (emergent condition) X- 1 10 Simple Discharge Coordination '[]'$  - 0 Complex (extensive) Discharge Coordination PROCESS - Special Needs '[]'$  - 0 Pediatric / Minor Patient Management '[]'$  - 0 Isolation Patient Management '[]'$  - 0 Hearing / Language /  Visual special needs '[]'$  - 0 Assessment of Community assistance (transportation, D/C planning, etc.) '[]'$  - 0 Additional assistance / Altered mentation '[]'$  - 0 Support Surface(s) Assessment (bed, cushion, seat, etc.) INTERVENTIONS -  Wound Cleansing / Measurement '[]'$  - 0 Simple Wound Cleansing - one wound X- 2 5 Complex Wound Cleansing - multiple wounds X- 1 5 Wound Imaging (photographs - any number of wounds) '[]'$  - 0 Wound Tracing (instead of photographs) '[]'$  - 0 Simple Wound Measurement - one wound '[]'$  - 0 Complex Wound Measurement - multiple wounds INTERVENTIONS - Wound Dressings '[]'$  - 0 Small Wound Dressing one or multiple wounds '[]'$  - 0 Medium Wound Dressing one or multiple wounds '[]'$  - 0 Large Wound Dressing one or multiple wounds '[]'$  - 0 Application of Medications - topical '[]'$  - 0 Application of Medications - injection INTERVENTIONS - Miscellaneous '[]'$  - 0 External ear exam XITLALY, AULT B ([id number]) [id number] Page 3 of 10 '[]'$  - 0 Specimen Collection (cultures, biopsies, blood, body fluids, etc.) '[]'$  - 0 Specimen(s) / Culture(s) sent or taken to Lab for analysis '[]'$  - 0 Patient Transfer (multiple staff / Harrel Lemon Lift / Similar devices) '[]'$  - 0 Simple Staple / Suture removal (25 or less) '[]'$  - 0 Complex Staple / Suture removal (26 or more) '[]'$  - 0 Hypo / Hyperglycemic Management (close monitor of Blood Glucose) '[]'$  - 0 Ankle / Brachial Index (ABI) - do not check if billed separately X- 1 5 Vital Signs Has the patient been seen at the hospital within the last three years: Yes Total Score: 80 Level Of Care: New/Established - Level 3 Electronic Signature(s) Signed: 02/27/2022 10:21:17 AM By: Massie Kluver Entered By: Massie Kluver on 02/24/2022 16:21:02 -------------------------------------------------------------------------------- Encounter Discharge Information Details Patient Name: Date of Service: [name], [place] 02/24/2022 3:00  PM Medical Record Number: [id number] Patient Account Number: [id number] Date of Birth/Sex: Treating RN: 11/09/1950 (71 y.o. [name], Kim Primary Care Gricel Copen: Carollee Leitz Other Clinician: Massie Kluver Referring Kanya Potteiger: Treating Courtni Balash/Extender: Carolan Shiver in Treatment: 2 Encounter Discharge Information Items Discharge Condition: Stable Ambulatory Status: Wheelchair Discharge Destination: Home Transportation: Private Auto Accompanied By: husband Schedule Follow-up Appointment: Yes Clinical Summary of Care: Electronic Signature(s) Signed: 02/27/2022 10:21:17 AM By: Massie Kluver Entered By: Massie Kluver on 02/24/2022 16:22:57 -------------------------------------------------------------------------------- Lower Extremity Assessment Details Patient Name: Date of Service: RO BERTS, NA NCY B. 02/24/2022 3:00 PM ALOIS, MINCER B ([id number]) [id number] Page 4 of 10 Medical Record Number: [id number] Patient Account Number: [id number] Date of Birth/Sex: Treating RN: [birth year] (71 y.o. [name], Kim Primary Care Faithann Natal: Carollee Leitz Other Clinician: Massie Kluver Referring Erickson Yamashiro: Treating Addilyn Satterwhite/Extender: Keturah Barre Weeks in Treatment: 2 Edema Assessment Assessed: [Left: Yes] [Right: Yes] Edema: [Left: No] [Right: No] Calf Left: Right: Point of Measurement: 32 cm From Medial Instep 34.6 cm 31.4 cm Ankle Left: Right: Point of Measurement: 10 cm From Medial Instep 16.7 cm 17.4 cm Vascular Assessment Pulses: Dorsalis Pedis Palpable: [Left:Yes] [Right:Yes] Electronic Signature(s) Signed: 02/24/2022 5:15:20 PM By: Gretta Cool, BSN, RN, CWS, Kim RN, BSN Signed: 02/27/2022 10:21:17 AM By: Massie Kluver Entered By: Massie Kluver on 02/24/2022 15:59:31 -------------------------------------------------------------------------------- Multi Wound Chart Details Patient Name: Date of Service: [name], Dawn Dodrill B.  02/24/2022 3:00 PM Medical Record Number: [id number] Patient Account Number: [id number] Date of Birth/Sex: Treating RN: 07-Jan-1951 (71 y.o. [name], Kim Primary Care Yul Diana: Carollee Leitz Other Clinician: Massie Kluver Referring Heike Pounds: Treating Kathalene Sporer/Extender: Keturah Barre Weeks in Treatment: 2 Vital Signs Height(in): 66 Pulse(bpm): 80 Weight(lbs): 093 Blood Pressure(mmHg): 146/73 Body Mass Index(BMI): 42.4 Temperature(F): 98.1 Respiratory Rate(breaths/min): 18 [22:Photos:] [N/A:N/A] Right, Lateral, Anterior, Circumferential Left, Midline Lower Leg N/A Wound Location: Lower Leg Fill,  Oriyah B ([id number]) [id number] Page 5 of 10 Gradually Appeared Gradually Appeared N/A Wounding Event: Diabetic Wound/Ulcer of the Lower Diabetic Wound/Ulcer of the Lower N/A Primary Etiology: Extremity Extremity Asthma, Pneumothorax, Arrhythmia, Asthma, Pneumothorax, Arrhythmia, N/A Comorbid History: Hypertension, Type II Diabetes, Gout, Hypertension, Type II Diabetes, Gout, Osteoarthritis, Neuropathy Osteoarthritis, Neuropathy 12/06/2021 12/06/2021 N/A Date Acquired: 2 2 N/A Weeks of Treatment: Healed - Epithelialized Healed - Epithelialized N/A Wound Status: No No N/A Wound Recurrence: Yes No N/A Clustered Wound: 0x0x0 0x0x0 N/A Measurements L x W x D (cm) 0 0 N/A A (cm) : rea 0 0 N/A Volume (cm) : 100.00% 100.00% N/A % Reduction in A rea: 100.00% 100.00% N/A % Reduction in Volume: Grade 1 Grade 2 N/A Classification: None Present None Present N/A Exudate A mount: Small (1-33%) Small (1-33%) N/A Granulation A mount: Red, Pink Red N/A Granulation Quality: Fat Layer (Subcutaneous Tissue): Yes Fascia: No N/A Exposed Structures: Fascia: No Fat Layer (Subcutaneous Tissue): No Tendon: No Tendon: No Muscle: No Muscle: No Joint: No Joint: No Bone: No Bone: No Large (67-100%) Large (67-100%)  N/A Epithelialization: Treatment Notes Electronic Signature(s) Signed: 02/27/2022 10:21:17 AM By: Massie Kluver Entered By: Massie Kluver on 02/24/2022 16:05:24 -------------------------------------------------------------------------------- Multi-Disciplinary Care Plan Details Patient Name: Date of Service: [name], Dawn Dodrill B. 02/24/2022 3:00 PM Medical Record Number: [id number] Patient Account Number: [id number] Date of Birth/Sex: Treating RN: 1950-09-10 (71 y.o. [name], Kim Primary Care Ciro Tashiro: Carollee Leitz Other Clinician: Massie Kluver Referring Loretha Ure: Treating Mallissa Lorenzen/Extender: Keturah Barre Weeks in Treatment: 2 Active Inactive Electronic Signature(s) Signed: 02/24/2022 5:15:20 PM By: Gretta Cool BSN, RN, CWS, Kim RN, BSN Signed: 02/27/2022 10:21:17 AM By: Massie Kluver Entered By: Massie Kluver on 02/24/2022 16:22:10 Dawn Fraise B ([id number]) [id number] Page 6 of 10 -------------------------------------------------------------------------------- Pain Assessment Details Patient Name: Date of Service: [name] 02/24/2022 3:00 PM Medical Record Number: [id number] Patient Account Number: [id number] Date of Birth/Sex: Treating RN: 21-Oct-1950 (71 y.o. [name], Kim Primary Care Herta Hink: Carollee Leitz Other Clinician: Massie Kluver Referring Breuna Loveall: Treating Acadia Thammavong/Extender: Keturah Barre Weeks in Treatment: 2 Active Problems Location of Pain Severity and Description of Pain Patient Has Paino No Site Locations Pain Management and Medication Current Pain Management: Electronic Signature(s) Signed: 02/24/2022 5:15:20 PM By: Gretta Cool, BSN, RN, CWS, Kim RN, BSN Signed: 02/27/2022 10:21:17 AM By: Massie Kluver Entered By: Massie Kluver on 02/24/2022 15:40:04 -------------------------------------------------------------------------------- Patient/Caregiver Education Details Patient Name: Date of Service: [name] 12/5/2023andnbsp3:00 PM Medical Record Number: [id number] Patient Account Number: [id number] Date of Birth/Gender: Treating RN: 1950-12-12 (71 y.o. [name] Primary Care Physician: Carollee Leitz Other Clinician: Massie Kluver Referring Physician: Treating Physician/Extender: Carolan Shiver in Treatment: 2 SHAASIA, ODLE ([id number]) [id number] Page 7 of 10 Education Assessment Education Provided To: Patient Education Topics Provided Wound/Skin Impairment: Handouts: Other: wound has healed please call if any further issues arise Methods: Explain/Verbal Responses: State content correctly Electronic Signature(s) Signed: 02/27/2022 10:21:17 AM By: Massie Kluver Entered By: Massie Kluver on 02/24/2022 16:21:43 -------------------------------------------------------------------------------- Wound Assessment Details Patient Name: Date of Service: [name], Dawn Dodrill B. 02/24/2022 3:00 PM Medical Record Number: [id number] Patient Account Number: [id number] Date of Birth/Sex: Treating RN: 1950-06-12 (71 y.o. [name] Primary Care Franciscojavier Wronski: Carollee Leitz Other Clinician: Massie Kluver Referring Davonne Baby: Treating Durelle Zepeda/Extender: Keturah Barre Weeks in Treatment: 2 Wound Status Wound Number: 22 Primary Diabetic Wound/Ulcer of the Lower Extremity Etiology: Wound Location: Right, Lateral, Anterior, Circumferential Lower  Leg Wound Healed - Epithelialized Wounding Event: Gradually Appeared Status: Date Acquired: 12/06/2021 Comorbid Asthma, Pneumothorax, Arrhythmia, Hypertension, Type II Weeks Of Treatment: 2 History: Diabetes, Gout, Osteoarthritis, Neuropathy Clustered Wound: Yes Photos Wound Measurements Length: (cm) Width: (cm) Depth: (cm) Area: (cm) Volume: (cm) 0 % Reduction in Area: 100% 0 % Reduction in Volume: 100% 0 Epithelialization: Large (67-100%) 0 Tunneling: No 0 Undermining:  No Wound Description Classification: Grade 1 Exudate Amount: None Present TEHANI, MERSMAN B ([id number]) Foul Odor After Cleansing: No Slough/Fibrino No [id number] Page 8 of 10 Wound Bed Granulation Amount: Small (1-33%) Exposed Structure Granulation Quality: Red, Pink Fascia Exposed: No Fat Layer (Subcutaneous Tissue) Exposed: Yes Tendon Exposed: No Muscle Exposed: No Joint Exposed: No Bone Exposed: No Treatment Notes Wound #22 (Lower Leg) Wound Laterality: Right, Lateral, Anterior, Circumferential Cleanser Peri-Wound Care Topical Primary Dressing Secondary Dressing Secured With Compression Wrap Compression Stockings Add-Ons Electronic Signature(s) Signed: 02/24/2022 5:15:20 PM By: Gretta Cool, BSN, RN, CWS, Kim RN, BSN Signed: 02/27/2022 10:21:17 AM By: Massie Kluver Entered By: Massie Kluver on 02/24/2022 16:05:07 -------------------------------------------------------------------------------- Wound Assessment Details Patient Name: Date of Service: [name], Dawn Dodrill B. 02/24/2022 3:00 PM Medical Record Number: [id number] Patient Account Number: [id number] Date of Birth/Sex: Treating RN: 08-14-1950 (71 y.o. [name], Kim Primary Care Dae Antonucci: Carollee Leitz Other Clinician: Massie Kluver Referring Brytni Dray: Treating Nicolina Hirt/Extender: Keturah Barre Weeks in Treatment: 2 Wound Status Wound Number: 23 Primary Diabetic Wound/Ulcer of the Lower Extremity Etiology: Wound Location: Left, Midline Lower Leg Wound Healed - Epithelialized Wounding Event: Gradually Appeared Status: Date Acquired: 12/06/2021 Comorbid Asthma, Pneumothorax, Arrhythmia, Hypertension, Type II Weeks Of Treatment: 2 History: Diabetes, Gout, Osteoarthritis, Neuropathy Clustered Wound: No Photos JAKI, STEPTOE B ([id number]) [id number] Page 9 of 10 Wound Measurements Length: (cm) Width: (cm) Depth: (cm) Area: (cm) Volume: (cm) 0 %  Reduction in Area: 100% 0 % Reduction in Volume: 100% 0 Epithelialization: Large (67-100%) 0 Tunneling: No 0 Undermining: No Wound Description Classification: Grade 2 Exudate Amount: None Present Foul Odor After Cleansing: No Slough/Fibrino No Wound Bed Granulation Amount: Small (1-33%) Exposed Structure Granulation Quality: Red Fascia Exposed: No Fat Layer (Subcutaneous Tissue) Exposed: No Tendon Exposed: No Muscle Exposed: No Joint Exposed: No Bone Exposed: No Treatment Notes Wound #23 (Lower Leg) Wound Laterality: Left, Midline Cleanser Peri-Wound Care Topical Primary Dressing Secondary Dressing Secured With Compression Wrap Compression Stockings Add-Ons Electronic Signature(s) Signed: 02/24/2022 5:15:20 PM By: Gretta Cool, BSN, RN, CWS, Kim RN, BSN Signed: 02/27/2022 10:21:17 AM By: Massie Kluver Entered By: Massie Kluver on 02/24/2022 16:05:07 -------------------------------------------------------------------------------- [place] [facility] Details Patient Name: Date of Service: RO BERTS, NA NCY B. 02/24/2022 3:00 PM Dawn Fraise B ([id number]) [id number] Page 10 of 10 Medical Record Number: [id number] Patient Account Number: [id number] Date of Birth/Sex: Treating RN: 24-Oct-1950 (71 y.o. [name], Kim Primary Care Auryn Paige: Carollee Leitz Other Clinician: Massie Kluver Referring Julene Rahn: Treating Azul Brumett/Extender: Keturah Barre Weeks in Treatment: 2 Vital Signs Time Taken: 15:36 Temperature (F): 98.1 Height (in): 66 Pulse (bpm): 80 Weight (lbs): 263 Respiratory Rate (breaths/min): 18 Body Mass Index (BMI): 42.4 Blood Pressure (mmHg): 146/73 Reference Range: 80 - 120 mg / dl Electronic Signature(s) Signed: 02/27/2022 10:21:17 AM By: Massie Kluver Entered By: Massie Kluver on 02/24/2022 15:39:59

## 2022-03-01 NOTE — Patient Instructions (Incomplete)
____________________________________________________________________________________________  Patient Information update  To: All of our patients.  Re: Name change.  It has been made official that our current name, "[facility]"   will soon be changed to "[facility]".   The purpose of this change is to eliminate any confusion created by the concept of our practice being a "Medication Management Pain Clinic". In the past this has led to the misconception that we treat pain primarily by the use of prescription medications.  Nothing can be farther from the truth.   Understanding PAIN MANAGEMENT: To further understand what our practice does, you first have to understand that "Pain Management" is a subspecialty that requires additional training once a physician has completed their specialty training, which can be in either Anesthesia, Neurology, Psychiatry, or Physical Medicine and Rehabilitation (PMR). Each one of these contributes to the final approach taken by each physician to the management of their patient's pain. To be a "Pain Management Specialist" you must have first completed one of the specialty trainings below.  Anesthesiologists - trained in clinical pharmacology and interventional techniques such as nerve blockade and regional as well as central neuroanatomy. They are trained to block pain before, during, and after surgical interventions.  Neurologists - trained in the diagnosis and pharmacological treatment of complex neurological conditions, such as Multiple Sclerosis, Parkinson's, spinal cord injuries, and other systemic conditions that may be associated with symptoms that may include but are not limited to pain. They tend to rely primarily on the treatment of chronic pain using prescription medications.  Psychiatrist - trained in conditions affecting the psychosocial  wellbeing of patients including but not limited to depression, anxiety, schizophrenia, personality disorders, addiction, and other substance use disorders that may be associated with chronic pain. They tend to rely primarily on the treatment of chronic pain using prescription medications.   Physical Medicine and Rehabilitation (PMR) physicians, also known as physiatrists - trained to treat a wide variety of medical conditions affecting the brain, spinal cord, nerves, bones, joints, ligaments, muscles, and tendons. Their training is primarily aimed at treating patients that have suffered injuries that have caused severe physical impairment. Their training is primarily aimed at the physical therapy and rehabilitation of those patients. They may also work alongside orthopedic surgeons or neurosurgeons using their expertise in assisting surgical patients to recover after their surgeries.  INTERVENTIONAL PAIN MANAGEMENT is sub-subspecialty of Pain Management.  Our physicians are Board-certified in Anesthesia, Pain Management, and Interventional Pain Management.  This meaning that not only have they been trained and Board-certified in their specialty of Anesthesia, and subspecialty of Pain Management, but they have also received further training in the sub-subspecialty of Interventional Pain Management, in order to become Board-certified as INTERVENTIONAL PAIN MANAGEMENT SPECIALIST.    Mission: Our goal is to use our skills in  INTERVENTIONAL PAIN MANAGEMENT as alternatives to the chronic use of prescription opioid medications for the treatment of pain. To make this more clear, we have changed our name to reflect what we do and offer. We will continue to offer medication management assessment and recommendations, but we will not be taking over any patient's medication management.  ____________________________________________________________________________________________      ____________________________________________________________________________________________  National Pain Medication Shortage  The [place] is experiencing worsening drug shortages. These have had a negative widespread effect on patient care and treatment. Not expected to improve any time soon. Predicted to last past 2029.   Drug shortage list (generic   names) Oxycodone IR Oxycodone/APAP Oxymorphone IR Hydromorphone Hydrocodone/APAP Morphine  Where is the problem?  Manufacturing and supply level.  Will this shortage affect you?  Only if you take any of the above pain medications.  How? You may be unable to fill your prescription.  Your pharmacist may offer a "partial fill" of your prescription. (Warning: Do not accept partial fills.) Read our Medication Rules and Regulation. Depending on how much medicine you are dependent on, you may experience withdrawals when unable to get the medication.  Recommendations: Consider ending your dependence on opioid pain medications. Ask your pain specialist to assist you with the process. Consider switching to a medication currently not in shortage, such as Buprenorphine. Talk to your pain specialist about this option. Consider decreasing your pain medication requirements by managing tolerance thru "Drug Holidays". This may help minimize withdrawals, should you run out of medicine. Control your pain thru the use of non-pharmacological interventional therapies.   Your prescriber: Prescribers cannot be blamed for shortages. Medication manufacturing and supply issues cannot be fixed by the prescriber.   NOTE: The prescriber is not responsible for supplying the medication, or solving supply issues. Work with your pharmacist to solve it. The patient is responsible for the decision to take or continue taking the medication and for identifying and securing a legal supply source. By law, supplying the medication is the job and responsibility of the  pharmacy. The prescriber is responsible for the evaluation, monitoring, and prescribing of these medications.   Prescribers will NOT: Re-issue prescriptions that have been partially filled. Re-issue prescriptions already sent to a pharmacy.  Re-send prescriptions to a different pharmacy because yours did not have your medication. Ask pharmacist to order the medicine or transfer it from one of their other pharmacies.  New 2023 regulation: "November 21, 2021 Revised Regulation Allows DEA-Registered Pharmacies to Transfer Electronic Prescriptions at a Patient's Request [place] Patients now have the ability to request their electronic prescription be transferred to another pharmacy without having to go back to their practitioner to initiate the request. This revised regulation went into effect on Monday, November 17, 2021.     At a patient's request, a DEA-registered retail pharmacy can now transfer an electronic prescription for a controlled substance (schedules II-V) to another DEA-registered retail pharmacy. Prior to this change, patients would have to go through their practitioner to cancel their prescription and have it re-issued to a different pharmacy. The process was taxing and time consuming for both patients and practitioners.    The Drug Enforcement Administration [facility]) published its intent to revise the process for transferring electronic prescriptions on February 09, 2020.  The final rule was published in the federal register on October 16, 2021 and went into effect 30 days later.  Under the final rule, a prescription can only be transferred once between pharmacies, and only if allowed under existing state or other applicable law. The prescription must remain in its electronic form; may not be altered in any way; and the transfer must be communicated directly between two licensed pharmacists. It's important to note, any authorized refills transfer  with the original prescription, which means the entire prescription will be filled at the same pharmacy".   [email or web address]  ____________________________________________________________________________________________     ____________________________________________________________________________________________  Drug Holidays  What is a "Drug Holiday"? Drug Holiday: is the name given to the process of slowly tapering down and temporarily stopping the pain medication for the purpose of decreasing or eliminating tolerance to  the drug.  Benefits Improved effectiveness Decreased required effective dose Improved pain control End dependence on high dose therapy Decrease cost of therapy Uncovering "opioid-induced hyperalgesia". (OIH)  What is "opioid hyperalgesia"? It is a paradoxical increase in pain caused by exposure to opioids. Stopping the opioid pain medication, contrary to the expected, it actually decreases or completely eliminates the pain. Ref.: "A comprehensive review of opioid-induced hyperalgesia". Brion Aliment, et.al. Pain Physician. 2011 Mar-Apr;14(2):145-61.  What is tolerance? Tolerance: the progressive loss of effectiveness of a pain medicine due to repetitive use. A common problem of opioid pain medications.  How long should a "Drug Holiday" last? Effectiveness depends on the patient staying off all opioid pain medicines for a minimum of 14 consecutive days. (2 weeks)  How about just taking less of the medicine? Does not work. Will not accomplish goal of eliminating the excess receptors.  How about switching to a different pain medicine? (AKA. "Opioid rotation") Does not work. Creates the illusion of effectiveness by taking advantage of inaccurate equivalent dose calculations between different opioids. -This "technique" was promoted by studies funded by [employer], such as [employer], creators of "OxyContin".  Can I stop the medicine "cold [place]"? Depends. You should always coordinate with your Pain Specialist to make the transition as smoothly as possible. Avoid stopping the medicine abruptly without consulting. We recommend a "slow taper".  What is a slow taper? Taper: refers to the gradual decrease in dose.   How do I stop/taper the dose? Slowly. Decrease the daily amount of pills that you take by one (1) pill every seven (7) days. This is called a "slow downward taper". Example: if you normally take four (4) pills per day, drop it to three (3) pills per day for seven (7) days, then to two (2) pills per day for seven (7) days, then to one (1) per day for seven (7) days, and then stop the medicine. The 14 day "Drug Holiday" starts on the first day without medicine.   Will I experience withdrawals? Unlikely with a slow taper.  What triggers withdrawals? Withdrawals are triggered by the sudden/abrupt stop of high dose opioids. Withdrawals can be avoided by slowly decreasing the dose over a prolonged period of time.  What are withdrawals? Symptoms associated with sudden/abrupt reduction/stopping of high-dose, long-term use of pain medication. Withdrawal are seldom seen on low dose therapy, or patients rarely taking opioid medication.  Early Withdrawal Symptoms may include: Agitation Anxiety Muscle aches Increased tearing Insomnia Runny nose Sweating Yawning  Late symptoms may include: Abdominal cramping Diarrhea Dilated pupils Goose bumps Nausea Vomiting  (Last update: 03/01/2022) ____________________________________________________________________________________________    _______________________________________________________________________  Medication Rules  Purpose: To inform patients, and their family members, of our medication rules and regulations.  Applies to: All patients receiving prescriptions from our  practice (written or electronic).  Pharmacy of record: This is the pharmacy where your electronic prescriptions will be sent. Make sure we have the correct one.  Electronic prescriptions: In compliance with the [place] (STOP) Act of 2017 (Session Lanny Cramp [phone]), effective March 23, 2018, all controlled substances must be electronically prescribed. Written prescriptions, faxing, or calling prescriptions to a pharmacy will no longer be done.  Prescription refills: These will be provided only during in-person appointments. No medications will be renewed without a "face-to-face" evaluation with your provider. Applies to all prescriptions.  NOTE: The following applies primarily to controlled substances (Opioid* Pain Medications).   Type of encounter (visit): For patients receiving  controlled substances, face-to-face visits are required. (Not an option and not up to the patient.)  Patient's responsibilities: Pain Pills: Bring all pain pills to every appointment (except for procedure appointments). Pill Bottles: Bring pills in original pharmacy bottle. Bring bottle, even if empty. Always bring the bottle of the most recent fill.  Medication refills: You are responsible for knowing and keeping track of what medications you are taking and when is it that you will need a refill. The day before your appointment: write a list of all prescriptions that need to be refilled. The day of the appointment: give the list to the admitting nurse. Prescriptions will be written only during appointments. No prescriptions will be written on procedure days. If you forget a medication: it will not be "Called in", "Faxed", or "electronically sent". You will need to get another appointment to get these prescribed. No early refills. Do not call asking to have your prescription filled early. Partial  or short prescriptions: Occasionally your pharmacy may not have enough pills to  fill your prescription.  NEVER ACCEPT a partial fill or a prescription that is short of the total amount of pills that you were prescribed.  With controlled substances the law allows 72 hours for the pharmacy to complete the prescription.  If the prescription is not completed within 72 hours, the pharmacist will require a new prescription to be written. This means that you will be short on your medicine and we WILL NOT send another prescription to complete your original prescription.  Instead, request the pharmacy to send a carrier to a nearby branch to get enough medication to provide you with your full prescription. Prescription Accuracy: You are responsible for carefully inspecting your prescriptions before leaving our office. Have the discharge nurse carefully go over each prescription with you, before taking them home. Make sure that your name is accurately spelled, that your address is correct. Check the name and dose of your medication to make sure it is accurate. Check the number of pills, and the written instructions to make sure they are clear and accurate. Make sure that you are given enough medication to last until your next medication refill appointment. Taking Medication: Take medication as prescribed. When it comes to controlled substances, taking less pills or less frequently than prescribed is permitted and encouraged. Never take more pills than instructed. Never take the medication more frequently than prescribed.  Inform other Doctors: Always inform, all of your healthcare providers, of all the medications you take. Pain Medication from other Providers: You are not allowed to accept any additional pain medication from any other Doctor or Healthcare provider. There are two exceptions to this rule. (see below) In the event that you require additional pain medication, you are responsible for notifying us, as stated below. Cough Medicine: Often these contain an opioid, such as codeine or  hydrocodone. Never accept or take cough medicine containing these opioids if you are already taking an opioid* medication. The combination may cause respiratory failure and death. Medication Agreement: You are responsible for carefully reading and following our Medication Agreement. This must be signed before receiving any prescriptions from our practice. Safely store a copy of your signed Agreement. Violations to the Agreement will result in no further prescriptions. (Additional copies of our Medication Agreement are available upon request.) Laws, Rules, & Regulations: All patients are expected to follow all Federal and [employer], Rules, [employer]. Ignorance of the Laws does not constitute a valid excuse.  Illegal drugs and  Controlled Substances: The use of illegal substances (including, but not limited to marijuana and its derivatives) and/or the illegal use of any controlled substances is strictly prohibited. Violation of this rule may result in the immediate and permanent discontinuation of any and all prescriptions being written by our practice. The use of any illegal substances is prohibited. Adopted CDC guidelines & recommendations: Target dosing levels will be at or below 60 MME/day. Use of benzodiazepines** is not recommended.  Exceptions: There are only two exceptions to the rule of not receiving pain medications from other Healthcare Providers. Exception #1 (Emergencies): In the event of an emergency (i.e.: accident requiring emergency care), you are allowed to receive additional pain medication. However, you are responsible for: As soon as you are able, call our office [phone], at any time of the day or night, and leave a message stating your name, the date and nature of the emergency, and the name and dose of the medication prescribed. In the event that your call is answered by a member of our staff, make sure to document and save the date, time, and the name of the person  that took your information.  Exception #2 (Planned Surgery): In the event that you are scheduled by another doctor or dentist to have any type of surgery or procedure, you are allowed (for a period no longer than 30 days), to receive additional pain medication, for the acute post-op pain. However, in this case, you are responsible for picking up a copy of our "Post-op Pain Management for Surgeons" handout, and giving it to your surgeon or dentist. This document is available at our office, and does not require an appointment to obtain it. Simply go to our office during business hours (Monday-Thursday from 8:00 AM to 4:00 PM) (Friday 8:00 AM to 12:00 Noon) or if you have a scheduled appointment with [place], prior to your surgery, and ask for it by name. In addition, you are responsible for: calling our office [phone], at any time of the day or night, and leaving a message stating your name, name of your surgeon, type of surgery, and date of procedure or surgery. Failure to comply with your responsibilities may result in termination of therapy involving the controlled substances. Medication Agreement Violation. Following the above rules, including your responsibilities will help you in avoiding a Medication Agreement Violation ("Breaking your Pain Medication Contract").  Consequences:  Not following the above rules may result in permanent discontinuation of medication prescription therapy.  *Opioid medications include: morphine, codeine, oxycodone, oxymorphone, hydrocodone, hydromorphone, meperidine, tramadol, tapentadol, buprenorphine, fentanyl, methadone. **Benzodiazepine medications include: diazepam (Valium), alprazolam (Xanax), clonazepam (Klonopine), lorazepam (Ativan), clorazepate (Tranxene), chlordiazepoxide (Librium), estazolam (Prosom), oxazepam (Serax), temazepam (Restoril), triazolam (Halcion) (Last updated: 01/13/2022) ______________________________________________________________________     ______________________________________________________________________  Medication Recommendations and Reminders  Applies to: All patients receiving prescriptions (written and/or electronic).  Medication Rules & Regulations: You are responsible for reading, knowing, and following our "Medication Rules" document. These exist for your safety and that of others. They are not flexible and neither are we. Dismissing or ignoring them is an act of "non-compliance" that may result in complete and irreversible termination of such medication therapy. For safety reasons, "non-compliance" will not be tolerated. As with the [place] fundamental legal principle of "ignorance of the law is no defense", we will accept no excuses for not having read and knowing the content of documents provided to you by our practice.  Pharmacy of record:  Definition: This is the pharmacy where your  electronic prescriptions will be sent.  We do not endorse any particular pharmacy. It is up to you and your insurance to decide what pharmacy to use.  We do not restrict you in your choice of pharmacy. However, once we write for your prescriptions, we will NOT be re-sending more prescriptions to fix restricted supply problems created by your pharmacy, or your insurance.  The pharmacy listed in the electronic medical record should be the one where you want electronic prescriptions to be sent. If you choose to change pharmacy, simply notify our nursing staff. Changes will be made only during your regular appointments and not over the phone.  Recommendations: Keep all of your pain medications in a safe place, under lock and key, even if you live alone. We will NOT replace lost, stolen, or damaged medication. We do not accept "Police Reports" as proof of medications having been stolen. After you fill your prescription, take 1 week's worth of pills and put them away in a safe place. You should keep a separate, properly labeled bottle for this  purpose. The remainder should be kept in the original bottle. Use this as your primary supply, until it runs out. Once it's gone, then you know that you have 1 week's worth of medicine, and it is time to come in for a prescription refill. If you do this correctly, it is unlikely that you will ever run out of medicine. To make sure that the above recommendation works, it is very important that you make sure your medication refill appointments are scheduled at least 1 week before you run out of medicine. To do this in an effective manner, make sure that you do not leave the office without scheduling your next medication management appointment. Always ask the nursing staff to show you in your prescription , when your medication will be running out. Then arrange for the receptionist to get you a return appointment, at least 7 days before you run out of medicine. Do not wait until you have 1 or 2 pills left, to come in. This is very poor planning and does not take into consideration that we may need to cancel appointments due to bad weather, sickness, or emergencies affecting our staff. DO NOT ACCEPT A "Partial Fill": If for any reason your pharmacy does not have enough pills/tablets to completely fill or refill your prescription, do not allow for a "partial fill". The law allows the pharmacy to complete that prescription within 72 hours, without requiring a new prescription. If they do not fill the rest of your prescription within those 72 hours, you will need a separate prescription to fill the remaining amount, which we will NOT provide. If the reason for the partial fill is your insurance, you will need to talk to the pharmacist about payment alternatives for the remaining tablets, but again, DO NOT ACCEPT A PARTIAL FILL, unless you can trust your pharmacist to obtain the remainder of the pills within 72 hours.  Prescription refills and/or changes in medication(s):  Prescription refills, and/or changes in dose  or medication, will be conducted only during scheduled medication management appointments. (Applies to both, written and electronic prescriptions.) No refills on procedure days. No medication will be changed or started on procedure days. No changes, adjustments, and/or refills will be conducted on a procedure day. Doing so will interfere with the diagnostic portion of the procedure. No phone refills. No medications will be "called into the pharmacy". No Fax refills. No weekend refills. No Holliday refills. No  after hours refills.  Remember:  Business hours are:  Monday to Thursday 8:00 AM to 4:00 PM Provider's Schedule: Milinda Pointer, MD - Appointments are:  Medication management: Monday and Wednesday 8:00 AM to 4:00 PM Procedure day: Tuesday and Thursday 7:30 AM to 4:00 PM Gillis Santa, MD - Appointments are:  Medication management: Tuesday and Thursday 8:00 AM to 4:00 PM Procedure day: Monday and Wednesday 7:30 AM to 4:00 PM (Last update: 01/13/2022) ______________________________________________________________________    ____________________________________________________________________________________________  CBD (cannabidiol) & Delta-8 (Delta-8 tetrahydrocannabinol) WARNING  Intro: Cannabidiol (CBD) and tetrahydrocannabinol (THC), are two natural compounds found in plants of the Cannabis genus. They can both be extracted from hemp or cannabis. Hemp and cannabis come from the Cannabis sativa plant. Both compounds interact with your body's endocannabinoid system, but they have very different effects. CBD does not produce the high sensation associated with cannabis. Delta-8 tetrahydrocannabinol, also known as delta-8 THC, is a psychoactive substance found in the Cannabis sativa plant, of which marijuana and hemp are two varieties. THC is responsible for the high associated with the illicit use of marijuana.  Applicable to: All individuals currently taking or considering taking  CBD (cannabidiol) and, more important, all patients taking opioid analgesic controlled substances (pain medication). (Example: oxycodone; oxymorphone; hydrocodone; hydromorphone; morphine; methadone; tramadol; tapentadol; fentanyl; buprenorphine; butorphanol; dextromethorphan; meperidine; codeine; etc.)  Legal status: CBD remains a Schedule I drug prohibited for any use. CBD is illegal with one exception. In the [place], CBD has a limited Transport planner (FDA) approval for the treatment of two specific types of epilepsy disorders. Only one CBD product has been approved by the FDA for this purpose: "Epidiolex". FDA is aware that some companies are marketing products containing cannabis and cannabis-derived compounds in ways that violate the [employer], Drug and Cosmetic Act [facility] Act) and that may put the health and safety of consumers at risk. The FDA, a Federal agency, has not enforced the CBD status since 2018. UPDATE: (05/09/2021) The Drug Enforcement Agency ([place]) issued a letter stating that "delta" cannabinoids, including Delta-8-THCO and Delta-9-THCO, synthetically derived from hemp do not qualify as hemp and will be viewed as Schedule I drugs. (Schedule I drugs, substances, or chemicals are defined as drugs with no currently accepted medical use and a high potential for abuse. Some examples of Schedule I drugs are: heroin, lysergic acid diethylamide (LSD), marijuana (cannabis), 3,4-methylenedioxymethamphetamine (ecstasy), methaqualone, and peyote.) (https://jennings.com/)  Legality: Some manufacturers ship CBD products nationally, which is illegal. Often such products are sold online and are therefore available throughout the country. CBD is openly sold in head shops and health food stores in some states where such sales have not been explicitly legalized. Selling unapproved products with unsubstantiated therapeutic claims is not only a violation of the law, but also can put patients at  risk, as these products have not been proven to be safe or effective. Federal illegality makes it difficult to conduct research on CBD.  Reference: "FDA Regulation of Cannabis and Cannabis-Derived Products, Including Cannabidiol (CBD)" - [employer].pt  Warning: CBD is not FDA approved and has not undergo the same manufacturing controls as prescription drugs.  This means that the purity and safety of available CBD may be questionable. Most of the time, despite manufacturer's claims, it is contaminated with THC (delta-9-tetrahydrocannabinol - the chemical in marijuana responsible for the "HIGH").  When this is the case, the [facility] contaminant will trigger a positive urine drug screen (UDS) test for Marijuana (carboxy-THC). Because a positive UDS for  any illicit substance is a violation of our medication agreement, your opioid analgesics (pain medicine) may be permanently discontinued. The FDA recently put out a warning about 5 things that everyone should be aware of regarding Delta-8 THC: Delta-8 THC products have not been evaluated or approved by the FDA for safe use and may be marketed in ways that put the public health at risk. The FDA has received adverse event reports involving delta-8 THC-containing products. Delta-8 THC has psychoactive and intoxicating effects. Delta-8 THC manufacturing often involve use of potentially harmful chemicals to create the concentrations of delta-8 THC claimed in the marketplace. The final delta-8 THC product may have potentially harmful by-products (contaminants) due to the chemicals used in the process. Manufacturing of delta-8 THC products may occur in uncontrolled or unsanitary settings, which may lead to the presence of unsafe contaminants or other potentially harmful substances. Delta-8 THC products should be kept out of the reach of children and  pets.  MORE ABOUT CBD  General Information: CBD was discovered in [birth year] and it is a derivative of the cannabis sativa genus plants (Marijuana and Hemp). It is one of the 113 identified substances found in Marijuana. It accounts for up to 40% of the plant's extract. As of 2018, preliminary clinical studies on CBD included research for the treatment of anxiety, movement disorders, and pain. CBD is available and consumed in multiple forms, including inhalation of smoke or vapor, as an aerosol spray, and by mouth. It may be supplied as an oil containing CBD, capsules, dried cannabis, or as a liquid solution. CBD is thought not to be as psychoactive as THC (delta-9-tetrahydrocannabinol - the chemical in marijuana responsible for the "HIGH"). Studies suggest that CBD may interact with different biological target receptors in the body, including cannabinoid and other neurotransmitter receptors. As of 2018 the mechanism of action for its biological effects has not been determined.  Side-effects  Adverse reactions: Dry mouth, diarrhea, decreased appetite, fatigue, drowsiness, malaise, weakness, sleep disturbances, and others.  Drug interactions: CBC may interact with other medications such as blood-thinners. Because CBD causes drowsiness on its own, it also increases the drowsiness caused by other medications, including antihistamines (such as Benadryl), benzodiazepines (Xanax, Ativan, Valium), antipsychotics, antidepressants and opioids, as well as alcohol and supplements such as kava, melatonin and St. John's Wort. Be cautious with the following combinations:   Brivaracetam (Briviact) Brivaracetam is changed and broken down by the body. CBD might decrease how quickly the body breaks down brivaracetam. This might increase levels of brivaracetam in the body.  Caffeine Caffeine is changed and broken down by the body. CBD might decrease how quickly the body breaks down caffeine. This might increase levels of  caffeine in the body.  Carbamazepine (Tegretol) Carbamazepine is changed and broken down by the body. CBD might decrease how quickly the body breaks down carbamazepine. This might increase levels of carbamazepine in the body and increase its side effects.  Citalopram (Celexa) Citalopram is changed and broken down by the body. CBD might decrease how quickly the body breaks down citalopram. This might increase levels of citalopram in the body and increase its side effects.  Clobazam (Onfi) Clobazam is changed and broken down by the liver. CBD might decrease how quickly the liver breaks down clobazam. This might increase the effects and side effects of clobazam.  Eslicarbazepine (Aptiom) Eslicarbazepine is changed and broken down by the body. CBD might decrease how quickly the body breaks down eslicarbazepine. This might increase levels of eslicarbazepine in the  body by a small amount.  Everolimus (Zostress) Everolimus is changed and broken down by the body. CBD might decrease how quickly the body breaks down everolimus. This might increase levels of everolimus in the body.  Lithium Taking higher doses of CBD might increase levels of lithium. This can increase the risk of lithium toxicity.  Medications changed by the liver (Cytochrome P450 1A1 (CYP1A1) substrates) Some medications are changed and broken down by the liver. CBD might change how quickly the liver breaks down these medications. This could change the effects and side effects of these medications.  Medications changed by the liver (Cytochrome P450 1A2 (CYP1A2) substrates) Some medications are changed and broken down by the liver. CBD might change how quickly the liver breaks down these medications. This could change the effects and side effects of these medications.  Medications changed by the liver (Cytochrome P450 1B1 (CYP1B1) substrates) Some medications are changed and broken down by the liver. CBD might change how quickly the  liver breaks down these medications. This could change the effects and side effects of these medications.  Medications changed by the liver (Cytochrome P450 2A6 (CYP2A6) substrates) Some medications are changed and broken down by the liver. CBD might change how quickly the liver breaks down these medications. This could change the effects and side effects of these medications.  Medications changed by the liver (Cytochrome P450 2B6 (CYP2B6) substrates) Some medications are changed and broken down by the liver. CBD might change how quickly the liver breaks down these medications. This could change the effects and side effects of these medications.  Medications changed by the liver (Cytochrome P450 2C19 (CYP2C19) substrates) Some medications are changed and broken down by the liver. CBD might change how quickly the liver breaks down these medications. This could change the effects and side effects of these medications.  Medications changed by the liver (Cytochrome P450 2C8 (CYP2C8) substrates) Some medications are changed and broken down by the liver. CBD might change how quickly the liver breaks down these medications. This could change the effects and side effects of these medications.  Medications changed by the liver (Cytochrome P450 2C9 (CYP2C9) substrates) Some medications are changed and broken down by the liver. CBD might change how quickly the liver breaks down these medications. This could change the effects and side effects of these medications.  Medications changed by the liver (Cytochrome P450 2D6 (CYP2D6) substrates) Some medications are changed and broken down by the liver. CBD might change how quickly the liver breaks down these medications. This could change the effects and side effects of these medications.  Medications changed by the liver (Cytochrome P450 2E1 (CYP2E1) substrates) Some medications are changed and broken down by the liver. CBD might change how quickly the liver  breaks down these medications. This could change the effects and side effects of these medications.  Medications changed by the liver (Cytochrome P450 3A4 (CYP3A4) substrates) Some medications are changed and broken down by the liver. CBD might change how quickly the liver breaks down these medications. This could change the effects and side effects of these medications.  Medications changed by the liver (Glucuronidated drugs) Some medications are changed and broken down by the liver. CBD might change how quickly the liver breaks down these medications. This could change the effects and side effects of these medications.  Medications that decrease the breakdown of other medications by the liver (Cytochrome P450 2C19 (CYP2C19) inhibitors) CBD is changed and broken down by the liver. Some drugs decrease  how quickly the liver changes and breaks down CBD. This could change the effects and side effects of CBD.  Medications that decrease the breakdown of other medications in the liver (Cytochrome P450 3A4 (CYP3A4) inhibitors) CBD is changed and broken down by the liver. Some drugs decrease how quickly the liver changes and breaks down CBD. This could change the effects and side effects of CBD.  Medications that increase breakdown of other medications by the liver (Cytochrome P450 3A4 (CYP3A4) inducers) CBD is changed and broken down by the liver. Some drugs increase how quickly the liver changes and breaks down CBD. This could change the effects and side effects of CBD.  Medications that increase the breakdown of other medications by the liver (Cytochrome P450 2C19 (CYP2C19) inducers) CBD is changed and broken down by the liver. Some drugs increase how quickly the liver changes and breaks down CBD. This could change the effects and side effects of CBD.  Methadone (Dolophine) Methadone is broken down by the liver. CBD might decrease how quickly the liver breaks down methadone. Taking cannabidiol along  with methadone might increase the effects and side effects of methadone.  Rufinamide (Banzel) Rufinamide is changed and broken down by the body. CBD might decrease how quickly the body breaks down rufinamide. This might increase levels of rufinamide in the body by a small amount.  Sedative medications (CNS depressants) CBD might cause sleepiness and slowed breathing. Some medications, called sedatives, can also cause sleepiness and slowed breathing. Taking CBD with sedative medications might cause breathing problems and/or too much sleepiness.  Sirolimus (Rapamune) Sirolimus is changed and broken down by the body. CBD might decrease how quickly the body breaks down sirolimus. This might increase levels of sirolimus in the body.  Stiripentol (Diacomit) Stiripentol is changed and broken down by the body. CBD might decrease how quickly the body breaks down stiripentol. This might increase levels of stiripentol in the body and increase its side effects.  Tacrolimus (Prograf) Tacrolimus is changed and broken down by the body. CBD might decrease how quickly the body breaks down tacrolimus. This might increase levels of tacrolimus in the body.  Tamoxifen (Soltamox) Tamoxifen is changed and broken down by the body. CBD might affect how quickly the body breaks down tamoxifen. This might affect levels of tamoxifen in the body.  Topiramate (Topamax) Topiramate is changed and broken down by the body. CBD might decrease how quickly the body breaks down topiramate. This might increase levels of topiramate in the body by a small amount.  Valproate Valproic acid can cause liver injury. Taking cannabidiol with valproic acid might increase the chance of liver injury. CBD and/or valproic acid might need to be stopped, or the dose might need to be reduced.  Warfarin (Coumadin) CBD might increase levels of warfarin, which can increase the risk for bleeding. CBD and/or warfarin might need to be stopped, or the  dose might need to be reduced.  Zonisamide Zonisamide is changed and broken down by the body. CBD might decrease how quickly the body breaks down zonisamide. This might increase levels of zonisamide in the body by a small amount. (Last update: 05/21/2021) ____________________________________________________________________________________________

## 2022-03-01 NOTE — Progress Notes (Unsigned)
PROVIDER NOTE: Information contained herein reflects review and annotations entered in association with encounter. Interpretation of such information and data should be left to medically-trained personnel. Information provided to patient can be located elsewhere in the medical record under "Patient Instructions". Document created using STT-dictation technology, any transcriptional errors that may result from process are unintentional.    Patient: [name]  Service Category: E/M  Provider: Gaspar Cola, MD  DOB: 1950/07/23  DOS: 03/02/2022  Referring Provider: Carollee Leitz, MD  MRN: [id number]  Specialty: Interventional Pain Management  PCP: Carollee Leitz, MD  Type: Established Patient  Setting: Ambulatory outpatient    Location: Office  Delivery: Face-to-face     HPI  [name], a 71 y.o. year old adult, is here today because of her Chronic pain syndrome [G89.4]. Ms. Budreau primary complain today is No chief complaint on file. Last encounter: My last encounter with her was on 02/02/2022. Pertinent problems: Ms. Tesar has Diabetic peripheral neuropathy associated with type 2 diabetes mellitus ([place]); Other shoulder lesions (Right); Neuropathy of lateral femoral cutaneous nerve (Right); Arthralgia; Osteoarthritis involving multiple joints; Status post reverse total shoulder replacement; Chronic pain syndrome; Chronic shoulder pain (Right); Carpal tunnel syndrome; Cervical radiculitis; Cubital tunnel syndrome (Right); Impingement syndrome of shoulder region; Pain in joint involving ankle and foot; Spinal stenosis of thoracic region; Hip joint pain (Left); Abnormality of gait and mobility; Diabetic ulcer of left foot ([place]); Muscle strain of hip (Left); Hip strain (Right); Ischial pain, right; Fibromyalgia syndrome; Chronic musculoskeletal pain; Peripheral vascular disease ([place]); Chronic ulcer of right leg ([place]); Meralgia paresthetica (Right); Chronic pain of right ankle; Arthritis;  Left ankle pain; Chronic shoulder pain (Left); Left hemiparesis ([place]); Primary osteoarthritis of left shoulder; Rotator cuff disorder, left; Ulcer of left heel and midfoot (HCC); PAD (peripheral artery disease) ([place]); Peripheral neurogenic pain; Chronic peripheral neuropathic pain; Chronic shoulder pain (Bilateral); Impaired range of motion of shoulder (Left); Acute pain of shoulder (Left); and Abnormal MRI, shoulder on their pertinent problem list. Pain Assessment: Severity of   is reported as a  /10. Location:    / . Onset:  . Quality:  . Timing:  . Modifying factor(s):  Marland Kitchen Vitals:  vitals were not taken for this visit.  BMI: Estimated body mass index is 42.85 kg/m as calculated from the following:   Height as of 02/06/22: _0  (1.676 m).   Weight as of 02/06/22: 265 lb 8 oz (120.4 kg).  Reason for encounter: medication management. ***  R3/02/2023  Nonopioids transferred 02/19/2020: Lyrica and Cymbalta  Pharmacotherapy Assessment  Analgesic: Hydrocodone/APAP 5/325 mg, 1 tab PO QD PRN (5 mg/day of hydrocodone) MME/day: 5 mg/day.   Monitoring: [place] PMP: PDMP reviewed during this encounter.       Pharmacotherapy: No side-effects or adverse reactions reported. Compliance: No problems identified. Effectiveness: Clinically acceptable.  No notes on file  No results found for: "CBDTHCR" No results found for: "D8THCCBX" No results found for: "D9THCCBX"  UDS:  Summary  Date Value Ref Range Status  10/29/2021 Note  Final    Comment:    ==================================================================== [employer] [facility] (MW) ==================================================================== Test                             Result       Flag       Units  Drug Present and Declared for Prescription Verification   Alprazolam  45           EXPECTED   ng/mg creat   Alpha-hydroxyalprazolam        62           EXPECTED   ng/mg creat    Source of alprazolam is a scheduled  prescription medication. Alpha-    hydroxyalprazolam is an expected metabolite of alprazolam.    Hydrocodone                    652          EXPECTED   ng/mg creat   Dihydrocodeine                 232          EXPECTED   ng/mg creat   Norhydrocodone                 537          EXPECTED   ng/mg creat    Sources of hydrocodone include scheduled prescription medications.    Dihydrocodeine and norhydrocodone are expected metabolites of    hydrocodone. Dihydrocodeine is also available as a scheduled    prescription medication.  ==================================================================== Test                      Result    Flag   Units      Ref Range   Creatinine              65               mg/dL      >=20 ==================================================================== Declared Medications:  The flagging and interpretation on this report are based on the  following declared medications.  Unexpected results may arise from  inaccuracies in the declared medications.   **Note: The testing scope of this panel includes these medications:   Alprazolam (Xanax)  Hydrocodone (Norco)   **Note: The testing scope of this panel does not include the  following reported medications:   Acetaminophen (Tylenol)  Acetaminophen (Norco)  Albuterol (Proventil HFA)  Apixaban (Eliquis)  Benzonatate (Tessalon)  Bupropion (Wellbutrin XL)  Calcium  Cholecalciferol  Clobetasol (Temovate)  Colchicine  Duloxetine (Cymbalta)  Estradiol (Estrace)  Fluconazole (Diflucan)  Fluticasone (Trelegy)  Folic Acid  Furosemide (Lasix)  Levocetirizine (Xyzal)  Levothyroxine (Synthroid)  Losartan (Cozaar)  Metformin (Glucophage)  Metoprolol (Toprol)  Montelukast (Singulair)  Multivitamin  Mupirocin (Bactroban)  Nystatin (Mycostatin)  Ondansetron (Zofran)  Pantoprazole (Protonix)  Pregabalin (Lyrica)  Rosuvastatin (Crestor)  Sitagliptin (Januvia)  Sucralfate (Carafate)  Supplement  Topical   Topical Diclofenac (Voltaren)  Umeclidinium (Trelegy)  Vibegron (Gemtesa)  Vilanterol (Trelegy)  Vitamin B1  Vitamin D  Vitamin D2 ==================================================================== For clinical consultation, please call [phone]. ====================================================================       ROS  Constitutional: Denies any fever or chills Gastrointestinal: No reported hemesis, hematochezia, vomiting, or acute GI distress Musculoskeletal: Denies any acute onset joint swelling, redness, loss of ROM, or weakness Neurological: No reported episodes of acute onset apraxia, aphasia, dysarthria, agnosia, amnesia, paralysis, loss of coordination, or loss of consciousness  Medication Review  ALPRAZolam, DULoxetine, Fluticasone-Umeclidin-Vilant, [place] No Mess Body Powder, HYDROcodone-acetaminophen, Semaglutide (1 MG/DOSE), Semaglutide (2 MG/DOSE), Semaglutide(0.25 or 0.5MG/DOS), Tub Transfer Board, Vibegron, acetaminophen, albuterol, apixaban, buPROPion, calcium-vitamin D, cephALEXin, clobetasol cream, colchicine, diclofenac Sodium, ergocalciferol, estradiol, folic acid, furosemide, glucose blood, levocetirizine, levothyroxine, losartan, metoprolol succinate, montelukast, multivitamin with minerals, mupirocin ointment, naloxone, nystatin cream, ondansetron, onetouch ultrasoft, pantoprazole,  pregabalin, rosuvastatin, sucralfate, sulfamethoxazole-trimethoprim, thiamine, and vitamin D3  History Review  Allergy: Ms. Mucha is allergic to morphine and related, penicillins, ambien [zolpidem], aspirin, and oxytetracycline. Drug: Ms. Such  reports no history of drug use. Alcohol:  reports no history of alcohol use. Tobacco:  reports that she has never smoked. She has never used smokeless tobacco. Social: Ms. Kise  reports that she has never smoked. She has never used smokeless tobacco. She reports that she does not drink alcohol and does not use  drugs. Medical:  has a past medical history of Abnormal antibody titer, Anxiety and depression, Arthritis, Asthma, Atypical chest pain, Carotid arterial disease ([place]), Chronic heart failure with preserved ejection fraction (HFpEF) ([place]), CKD (chronic kidney disease), stage III ([place]), Cystocele, Depression, Diabetes ([place]), DVT (deep venous thrombosis) ([place]), Dyspnea, Edema, GERD (gastroesophageal reflux disease), Heart murmur, History of IBS, History of kidney stones, History of methicillin resistant staphylococcus aureus (MRSA) (2008), History of multiple strokes, Hyperlipidemia, Hypertension, Hypothyroidism, Insomnia, Kidney stone, Mitral regurgitation, Neuropathy involving both lower extremities, Non-healing ulcer of left foot ([place]), Obesity, Class II, BMI 35-39.9, with comorbidity, PAF (paroxysmal atrial fibrillation) ([place]), PAH (pulmonary artery hypertension) ([place]), Peripheral vascular disease ([place]), Sleep apnea, Stroke ([place]), Tricuspid regurgitation, Urinary incontinence, and Venous stasis dermatitis of both lower extremities. Surgical: Ms. Vialpando  has a past surgical history that includes Ankle surgery (Right); Vaginal hysterectomy; Sleeve Gastroplasty; Lithotripsy; Kidney surgery (Right); transthoracic echocardiogram (08/03/2013); transesophageal echocardiogram (08/08/2013); Tonsillectomy; Total knee arthroplasty (Left); Hiatal hernia repair; Cholecystectomy; I & D extremity (Left, 06/27/2015); Application if wound vac (Left, 06/27/2015); removal of left hematoma (Left); [place] GATED [facility] HX) (February 2017); Reverse shoulder arthroplasty (Right, 10/08/2015); Hernia repair; Ulnar nerve transposition (Right, 08/06/2016); arm surgery; Irrigation and debridement abscess (Left, 09/07/2016); Incision and drainage of wound (Left, 09/25/2016); Application if wound vac (Left, 09/25/2016); Colonoscopy; Upper gi endoscopy; Esophagogastroduodenoscopy (egd) with propofol (N/A, 01/11/2017); Colonoscopy with propofol (N/A,  01/11/2017); Colonoscopy with propofol (N/A, 02/22/2017); OTHER SURGICAL HISTORY; Total shoulder replacement (Right); Joint replacement (2013); Colonoscopy with propofol (N/A, 05/31/2017); Lower Extremity Angiography (Left, 04/04/2018); Esophagogastroduodenoscopy (egd) with propofol (N/A, 10/25/2018); Incision and drainage of wound (Left, 11/09/2018); Esophagogastroduodenoscopy (egd) with propofol (N/A, 11/29/2019); Esophagogastroduodenoscopy (egd) with propofol (N/A, 01/26/2022); and Colonoscopy with propofol (N/A, 01/26/2022). Family: family history includes Alcohol abuse in her father; Arthritis in her maternal grandfather, maternal grandmother, mother, paternal grandfather, and paternal grandmother; Breast cancer in her maternal aunt; Cancer in her father and maternal aunt; Cerebral aneurysm in her father; Diabetes in her father, maternal grandmother, and paternal grandmother; Heart disease in her maternal grandfather; Hypertension in her father, maternal grandfather, maternal grandmother, paternal grandfather, and paternal grandmother; Kidney disease in her mother; Mental illness in her sister; Peripheral vascular disease in her father; Skin cancer in her father; Varicose Veins in her mother.  Laboratory Chemistry Profile   Renal Lab Results  Component Value Date   BUN 29 (H) 02/06/2022   CREATININE 1.25 (H) 02/06/2022   LABCREA 48 08/22/2020   BCR 27 06/20/2021   GFR 41.16 (L) 11/27/2021   GFRAA 58 (L) 12/25/2019   GFRNONAA 46 (L) 02/06/2022    Hepatic Lab Results  Component Value Date   AST 16 11/27/2021   ALT 15 11/27/2021   ALBUMIN 4.2 11/27/2021   ALKPHOS 80 11/27/2021   LIPASE 27 07/28/2018   AMMONIA <9 (L) 02/07/2020    Electrolytes Lab Results  Component Value Date   NA 140 02/06/2022   K 4.2 02/06/2022   CL 103 02/06/2022  CALCIUM 8.7 (L) 02/06/2022   MG 1.9 06/08/2021   PHOS 4.0 06/08/2021    Bone Lab Results  Component Value Date   VD25OH 54.75 11/27/2021    Inflammation  (CRP: Acute Phase) (ESR: Chronic Phase) Lab Results  Component Value Date   CRP 3.1 (H) 04/08/2020   ESRSEDRATE 60 (H) 04/08/2020   LATICACIDVEN 1.1 12/20/2019         Note: Above Lab results reviewed.  Recent Imaging Review  CT ABDOMEN PELVIS WO CONTRAST CLINICAL DATA:  Abdominal pain, acute, nonlocalized  EXAM: CT ABDOMEN AND PELVIS WITHOUT CONTRAST  TECHNIQUE: Multidetector CT imaging of the abdomen and pelvis was performed following the standard protocol without IV contrast.  RADIATION DOSE REDUCTION: This exam was performed according to the departmental dose-optimization program which includes automated exposure control, adjustment of the mA and/or kV according to patient size and/or use of iterative reconstruction technique.  COMPARISON:  02/07/2020, 08/12/2017  FINDINGS: Lower chest: Mild right basilar atelectasis. Cardiac size within normal limits. Central pulmonary arteries are enlarged in keeping with changes of pulmonary arterial hypertension. Small hiatal hernia.  Hepatobiliary: No focal liver abnormality is seen. Status post cholecystectomy. No biliary dilatation.  Pancreas: Unremarkable  Spleen: Unremarkable  Adrenals/Urinary Tract: Bilateral macroscopic fat containing adrenal masses are again identified in keeping with bilateral adrenal myelolipoma. Since remote prior examination, these demonstrate interval increase in size, now measuring 9.4 cm on the left and 9.3 cm in greatest dimension on the right. The kidneys are normal in size and position. No hydronephrosis. Nonobstructing 6 mm calculus within the left renal pelvis. No additional renal or ureteral calculi. Bladder unremarkable.  Stomach/Bowel: Surgical changes of gastric sleeve resection are identified. The stomach, small bowel, and large bowel are otherwise unremarkable. Appendix normal. No free intraperitoneal gas or fluid.  Vascular/Lymphatic: No significant vascular findings are  present. No enlarged abdominal or pelvic lymph nodes.  Reproductive: Status post hysterectomy. No adnexal masses.  Other: Moderate subcutaneous infiltration is seen within the pannus and flanks bilaterally, most commonly reflecting subcutaneous edema. No abdominal wall hernia.  Musculoskeletal: No acute bone abnormality. Degenerative changes are seen within the thoracolumbar spine. Stable T12 mild compression deformity.  IMPRESSION: No acute intra-abdominal pathology identified. No definite radiographic explanation for the patient's reported symptoms.  Bilateral benign adrenal myelolipomas again identified demonstrating mild interval increase in size since prior examination.  Status post gastric sleeve resection.  Small hiatal hernia.  Electronically Signed   By: Fidela Salisbury M.D.   On: 06/06/2021 21:54 DG Chest 2 View CLINICAL DATA:  Shortness of breath.  EXAM: CHEST - 2 VIEW  COMPARISON:  04/11/2021  FINDINGS: Stable enlargement of the cardiopericardial silhouette. Asymmetric elevation of the right hemidiaphragm is unchanged. No pulmonary edema or pleural effusion. Insert no consolidation status post right shoulder replacement  IMPRESSION: Stable exam. No acute cardiopulmonary findings.  Electronically Signed   By: Misty Stanley M.D.   On: 06/06/2021 18:40 Note: Reviewed        Physical Exam  General appearance: Well nourished, well developed, and well hydrated. In no apparent acute distress Mental status: Alert, oriented x 3 (person, place, & time)       Respiratory: No evidence of acute respiratory distress Eyes: PERLA Vitals: There were no vitals taken for this visit. BMI: Estimated body mass index is 42.85 kg/m as calculated from the following:   Height as of 02/06/22: _0  (1.676 m).   Weight as of 02/06/22: 265 lb 8 oz (120.4 kg). Ideal: Patient  weight not recorded  Assessment   Diagnosis Status  1. Chronic pain syndrome   2. Fibromyalgia  syndrome   3. Meralgia paresthetica (Right)   4. Chronic shoulder pain (Bilateral)   5. Osteoarthritis involving multiple joints   6. Pharmacologic therapy   7. Chronic use of opiate for therapeutic purpose   8. Encounter for medication management   9. Encounter for chronic pain management    Controlled Controlled Controlled   Updated Problems: No problems updated.  Plan of Care  Problem-specific:  No problem-specific Assessment & Plan notes found for this encounter.  Ms. ERIYONNA MATSUSHITA has a current medication list which includes the following long-term medication(s): albuterol, bupropion, calcium-vitamin d, colchicine, duloxetine, eliquis, furosemide, gemtesa, hydrocodone-acetaminophen, levocetirizine, levothyroxine, losartan, metoprolol succinate, montelukast, pantoprazole, pregabalin, and rosuvastatin.  Pharmacotherapy (Medications Ordered): No orders of the defined types were placed in this encounter.  Orders:  No orders of the defined types were placed in this encounter.  Follow-up plan:   No follow-ups on file.     Interventional Therapies  Risk  Complexity Considerations:   Estimated body mass index is 41.79 kg/m as calculated from the following:   Height as of this encounter: 5' 5.5" (1.664 m).   Weight as of this encounter: 255 lb (115.7 kg). NOTE: Eliquis ANTICOAGULATION (Stop: 3 days  Restart: 6 hours) NO LUMBAR RFA until BMI < 35.   Planned  Pending:      Under consideration:   Diagnostic left suprascapular NB   Completed:   None since before 2017   Completed by other providers:   Therapeutic left IA shoulder joint injection x1 (May 2023) by Dr. Thalia Party [facility])    Therapeutic  Palliative (PRN) options:   None at this time   Pharmacotherapy:  Nonopioids transferred 02/19/2020: Lyrica and Cymbalta Recommendations:   None at this time.     Recent Visits Date Type Provider Dept  02/02/22 Office Visit Milinda Pointer, MD  [facility]  Showing recent visits within past 90 days and meeting all other requirements Future Appointments Date Type Provider Dept  03/02/22 Appointment Milinda Pointer, MD [facility]  Showing future appointments within next 90 days and meeting all other requirements  I discussed the assessment and treatment plan with the patient. The patient was provided an opportunity to ask questions and all were answered. The patient agreed with the plan and demonstrated an understanding of the instructions.  Patient advised to call back or seek an in-person evaluation if the symptoms or condition worsens.  Duration of encounter: *** minutes.  Total time on encounter, as per AMA guidelines included both the face-to-face and non-face-to-face time personally spent by the physician and/or other qualified health care professional(s) on the day of the encounter (includes time in activities that require the physician or other qualified health care professional and does not include time in activities normally performed by clinical staff). Physician's time may include the following activities when performed: preparing to see the patient (eg, review of tests, pre-charting review of records) obtaining and/or reviewing separately obtained history performing a medically appropriate examination and/or evaluation counseling and educating the patient/family/caregiver ordering medications, tests, or procedures referring and communicating with other health care professionals (when not separately reported) documenting clinical information in the electronic or other health record independently interpreting results (not separately reported) and communicating results to the patient/ family/caregiver care coordination (not separately reported)  Note by: Gaspar Cola, MD Date: 03/02/2022; Time: 3:33 PM

## 2022-03-02 ENCOUNTER — Encounter: Payer: Self-pay | Admitting: Pain Medicine

## 2022-03-02 ENCOUNTER — Ambulatory Visit: Payer: Medicare HMO | Attending: Pain Medicine | Admitting: Pain Medicine

## 2022-03-02 VITALS — BP 117/64 | HR 69 | Temp 97.5°F | Resp 16 | Ht 66.0 in | Wt 265.0 lb

## 2022-03-02 DIAGNOSIS — M25511 Pain in right shoulder: Secondary | ICD-10-CM | POA: Insufficient documentation

## 2022-03-02 DIAGNOSIS — M797 Fibromyalgia: Secondary | ICD-10-CM | POA: Insufficient documentation

## 2022-03-02 DIAGNOSIS — M159 Polyosteoarthritis, unspecified: Secondary | ICD-10-CM | POA: Diagnosis not present

## 2022-03-02 DIAGNOSIS — G8929 Other chronic pain: Secondary | ICD-10-CM | POA: Insufficient documentation

## 2022-03-02 DIAGNOSIS — G894 Chronic pain syndrome: Secondary | ICD-10-CM | POA: Diagnosis not present

## 2022-03-02 DIAGNOSIS — M25512 Pain in left shoulder: Secondary | ICD-10-CM | POA: Insufficient documentation

## 2022-03-02 DIAGNOSIS — G5711 Meralgia paresthetica, right lower limb: Secondary | ICD-10-CM | POA: Insufficient documentation

## 2022-03-02 DIAGNOSIS — Z79891 Long term (current) use of opiate analgesic: Secondary | ICD-10-CM | POA: Insufficient documentation

## 2022-03-02 DIAGNOSIS — Z79899 Other long term (current) drug therapy: Secondary | ICD-10-CM | POA: Insufficient documentation

## 2022-03-02 MED ORDER — HYDROCODONE-ACETAMINOPHEN 5-325 MG PO TABS: 1.0000 | ORAL_TABLET | Freq: Two times a day (BID) | ORAL | 0 refills | Status: DC | PRN

## 2022-03-02 NOTE — Progress Notes (Signed)
Nursing Pain Medication Assessment:  Safety precautions to be maintained throughout the outpatient stay will include: orient to surroundings, keep bed in low position, maintain call bell within reach at all times, provide assistance with transfer out of bed and ambulation.  Medication Inspection Compliance:  empty   Medication: Hydrocodone/APAP Pill/Patch Count:  0 of 45 pills remain Pill/Patch Appearance: No markings Bottle Appearance: Standard pharmacy container. Clearly labeled. Filled Date: 73 / 13 / 2023 Last Medication intake:  Today

## 2022-03-04 ENCOUNTER — Telehealth: Payer: Self-pay | Admitting: Family Medicine

## 2022-03-04 ENCOUNTER — Other Ambulatory Visit: Payer: Self-pay | Admitting: Family Medicine

## 2022-03-04 ENCOUNTER — Encounter: Payer: Medicare HMO | Admitting: Family Medicine

## 2022-03-04 DIAGNOSIS — E785 Hyperlipidemia, unspecified: Secondary | ICD-10-CM

## 2022-03-04 DIAGNOSIS — Z6841 Body Mass Index (BMI) 40.0 and over, adult: Secondary | ICD-10-CM

## 2022-03-04 DIAGNOSIS — I152 Hypertension secondary to endocrine disorders: Secondary | ICD-10-CM

## 2022-03-04 MED ORDER — OZEMPIC (2 MG/DOSE) 8 MG/3ML ~~LOC~~ SOPN: 2.0000 mg | PEN_INJECTOR | SUBCUTANEOUS | 6 refills | Status: DC

## 2022-03-04 NOTE — Telephone Encounter (Signed)
Refill sent.

## 2022-03-04 NOTE — Telephone Encounter (Signed)
Prescription Request  03/04/2022  Is this a "Controlled Substance" medicine? No  LOV: Visit date not found  What is the name of the medication or equipment? Semaglutide, 2 MG/DOSE, (OZEMPIC, 2 MG/DOSE,) 8 MG/3ML SOPN Patient took last injection today. TOC is on 03/17/22  Have you contacted your pharmacy to request a refill? No   Which pharmacy would you like this sent to?  [facility] - [place], [place] - [address] [address] Phone: [phone] Fax: [phone]    Patient notified that their request is being sent to the clinical staff for review and that they should receive a response within 2 business days.   Please advise at [place]

## 2022-03-05 DIAGNOSIS — I10 Essential (primary) hypertension: Secondary | ICD-10-CM | POA: Diagnosis not present

## 2022-03-05 DIAGNOSIS — G4733 Obstructive sleep apnea (adult) (pediatric): Secondary | ICD-10-CM | POA: Diagnosis not present

## 2022-03-05 DIAGNOSIS — Z8673 Personal history of transient ischemic attack (TIA), and cerebral infarction without residual deficits: Secondary | ICD-10-CM | POA: Diagnosis not present

## 2022-03-05 DIAGNOSIS — G4700 Insomnia, unspecified: Secondary | ICD-10-CM | POA: Diagnosis not present

## 2022-03-07 IMAGING — DX DG CHEST 1V PORT
1 series · 1 of 1 positions shown · non-contrast
Comparison: November 30, 2019

CLINICAL DATA: Generalized weakness.

EXAM:
PORTABLE CHEST 1 VIEW

[chest ap]
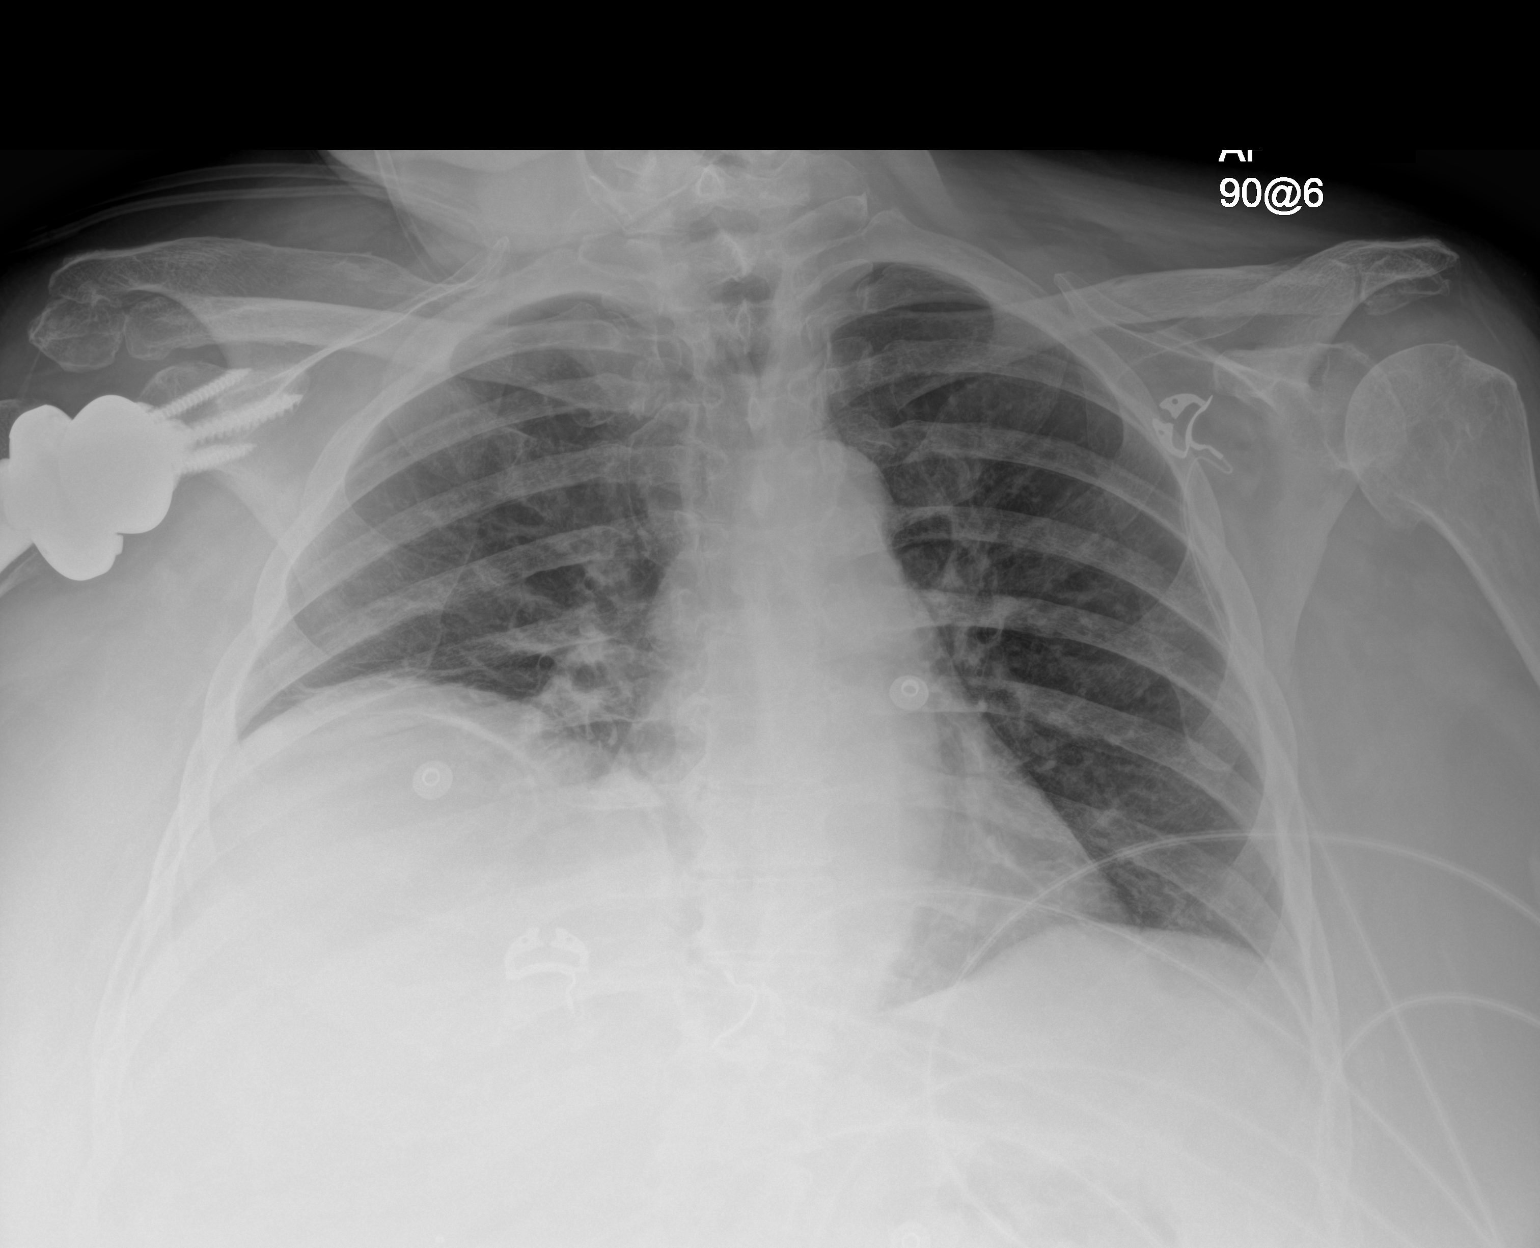

[1 of 1 positions shown; findings below may reference images not displayed]

FINDINGS: There is stable elevation of the right hemidiaphragm with
subsequently decreased lung volumes noted on the right. Mild,
diffuse chronic appearing increased lung markings are noted. Very
mild atelectasis is seen within the right lung base. There is no
evidence of a pleural effusion or pneumothorax. The heart size and
mediastinal contours are within normal limits. An intact right
shoulder replacement is noted.
IMPRESSION: Chronic appearing increased lung markings with very mild right
basilar atelectasis.

## 2022-03-07 IMAGING — DX DG FOOT COMPLETE 3+V*L*
3 series · 3 of 3 positions shown · non-contrast
Comparison: September 03, 2019

CLINICAL DATA: Healing ulcer.

EXAM:
LEFT FOOT - COMPLETE 3+ VIEW

[foot ap]
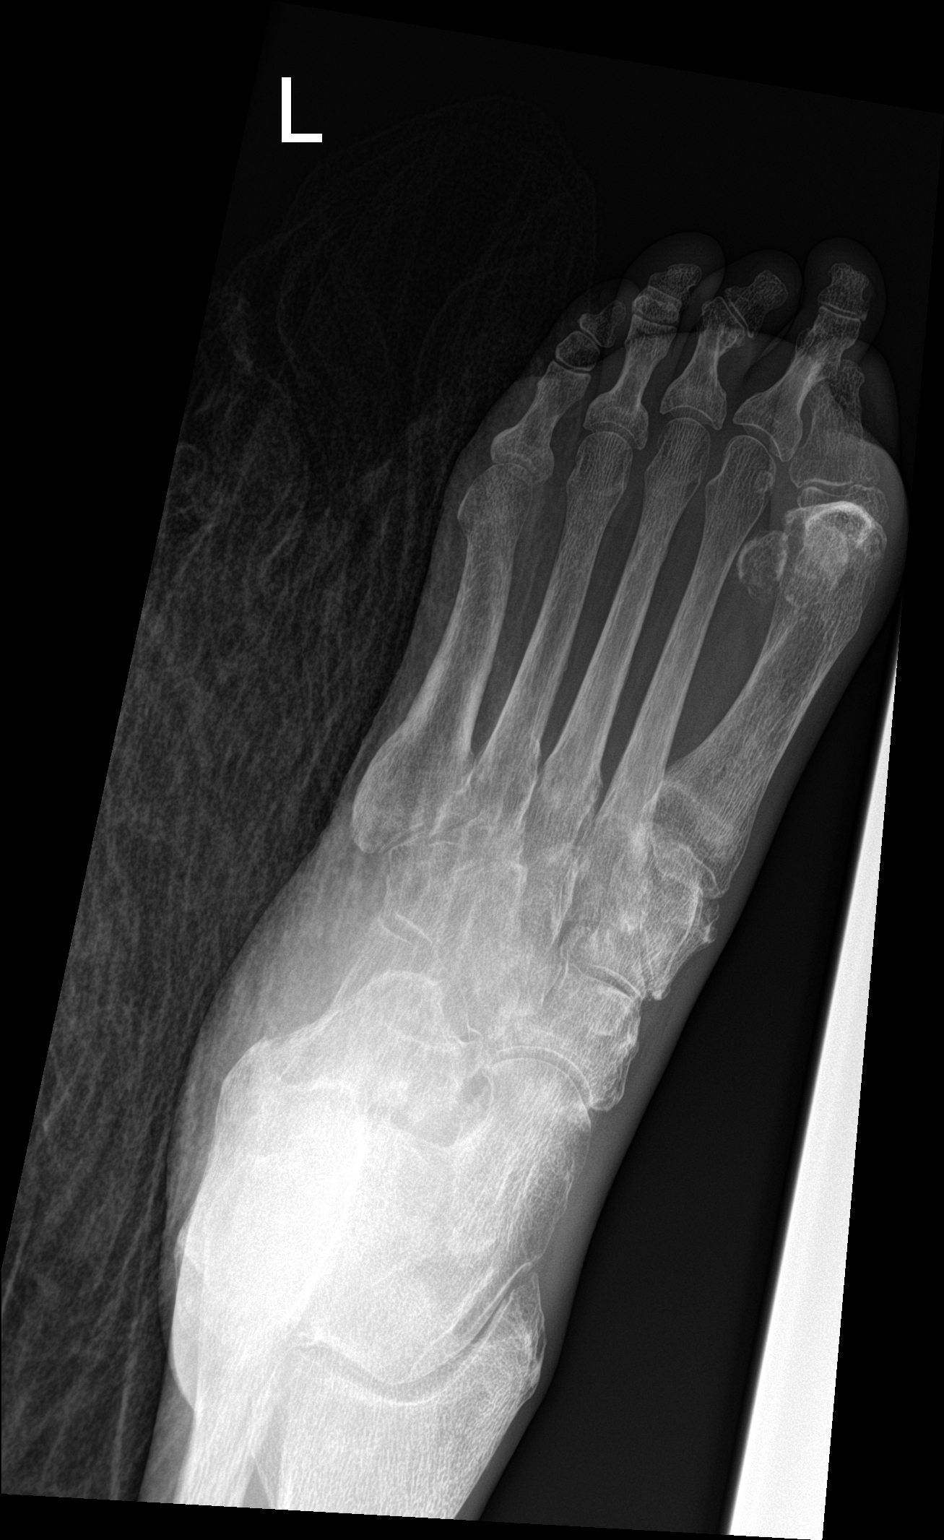

[foot obl]
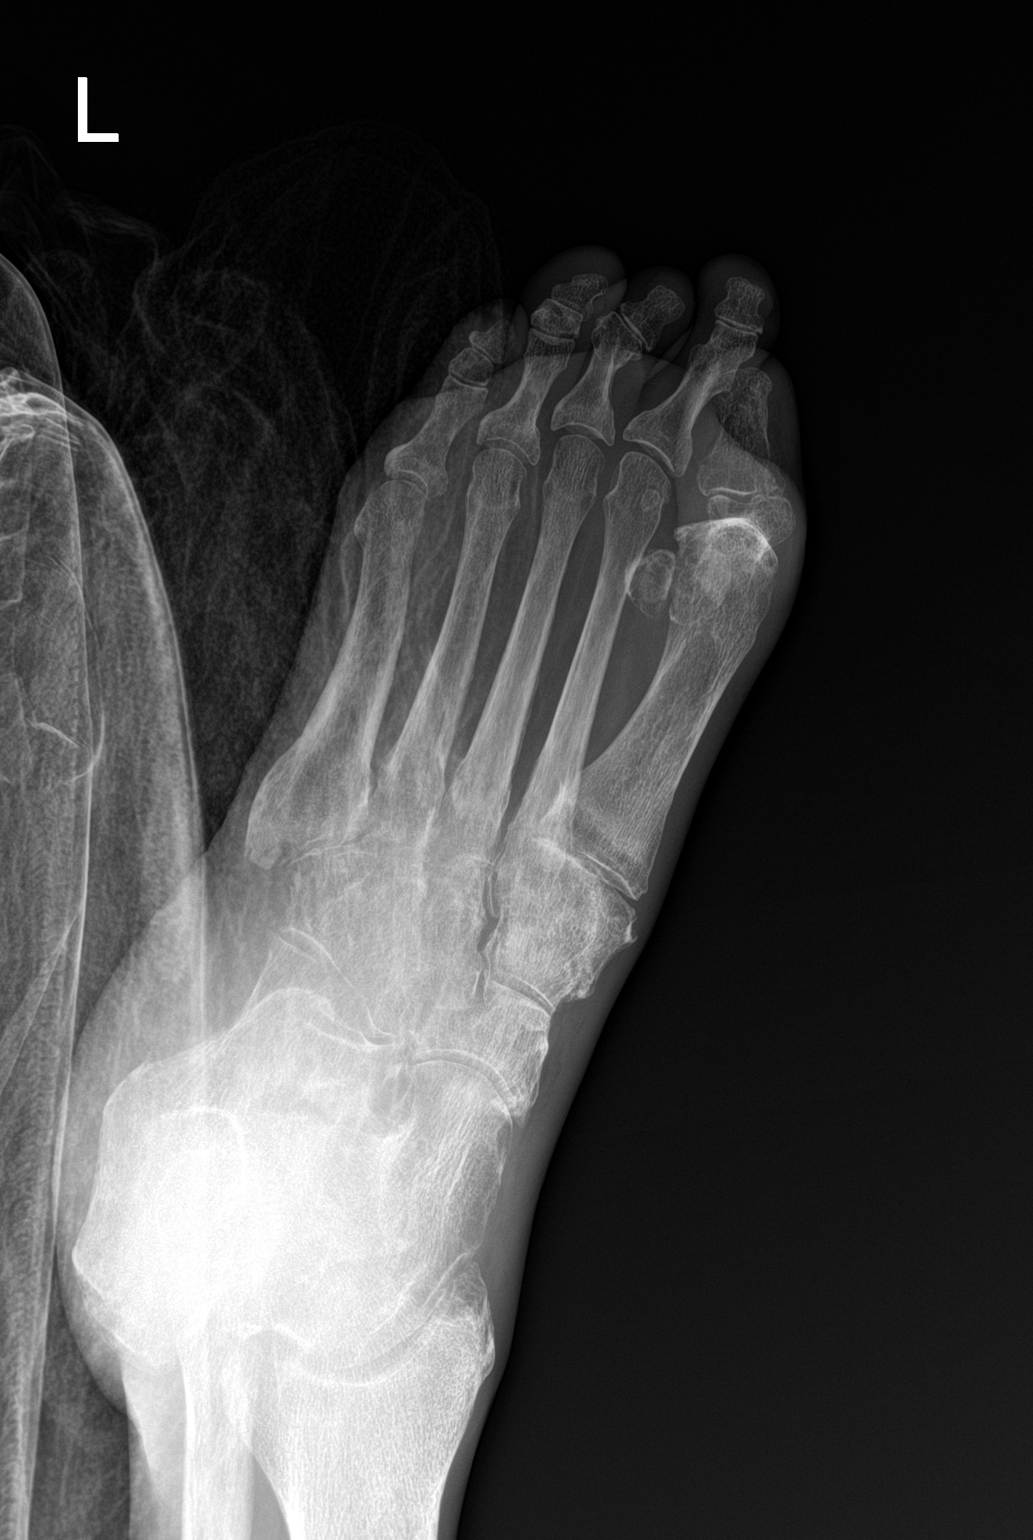

[foot lat]
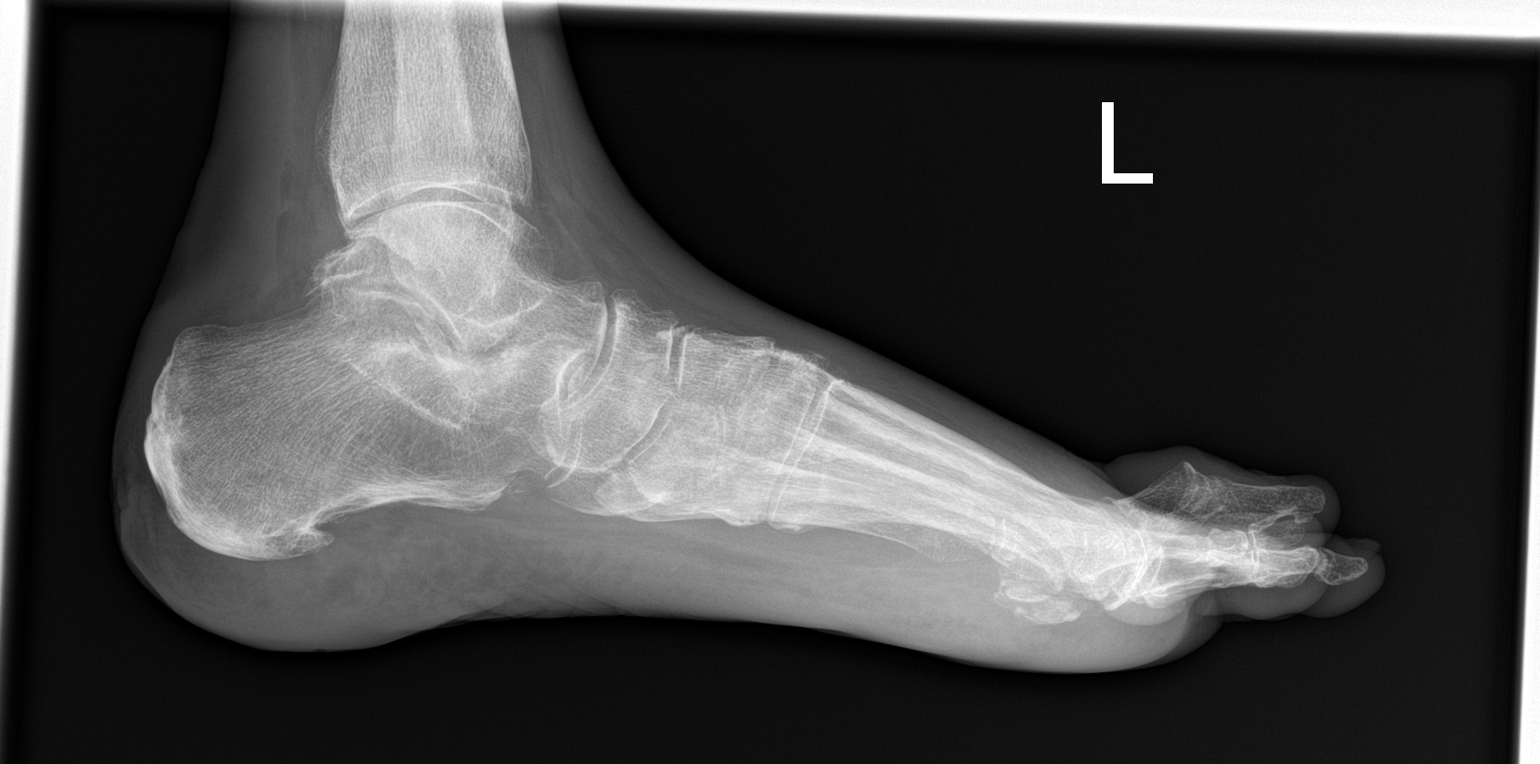

[3 of 3 positions shown; findings below may reference images not displayed]

FINDINGS: There is no evidence of an acute fracture or dislocation. Chronic
fracture deformities are seen involving the proximal phalanges of
the second and third left toes. Mild degenerative changes are seen
along the dorsal aspect of the mid left foot. A moderate-sized
plantar calcaneal spur is seen. Mild to moderate severity soft
tissue swelling is seen along the plantar aspect of the left foot.
IMPRESSION: 1. Chronic fracture deformities of the proximal phalanges of the
second and third left toes.
2. Degenerative changes, without evidence of acute osseous
abnormality.

## 2022-03-09 IMAGING — CT CT HUMERUS*L* W/O CM
2 of 6 series · 13 of 36 positions shown, 15 images · non-contrast
Comparison: X-ray 12/20/2019

CLINICAL DATA: Left arm pain after fall

EXAM:
CT OF THE UPPER LEFT EXTREMITY WITHOUT CONTRAST
TECHNIQUE: Multidetector CT imaging of the upper left extremity was performed
according to the standard protocol.

[Series 6: ax bone · axial · 0.39mm/px · z∈[-805,-543]mm · 7 of 245 slices shown, 9 images]
[im 19/245  soft-tissue]
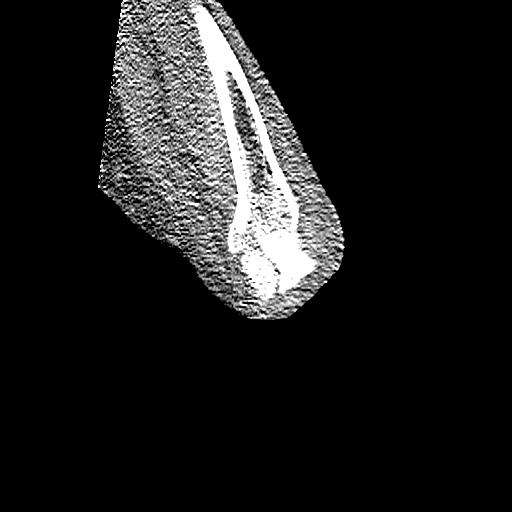
[im 19/245  bone]
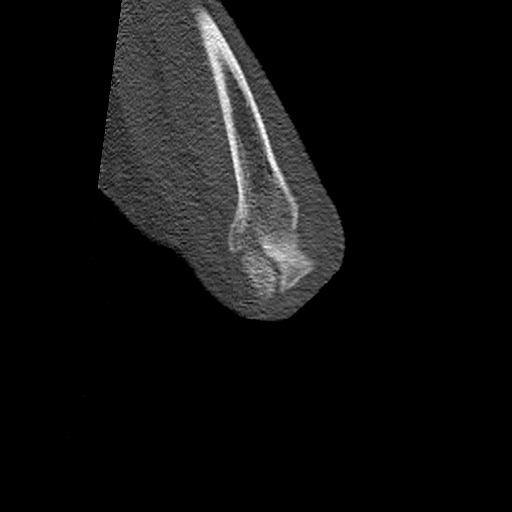
[im 57/245  bone]
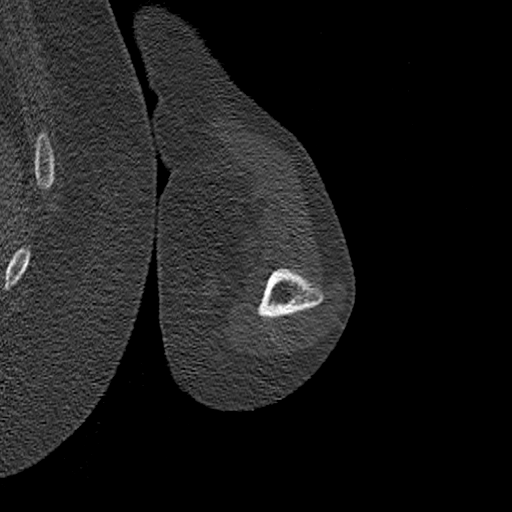
[im 94/245  bone]
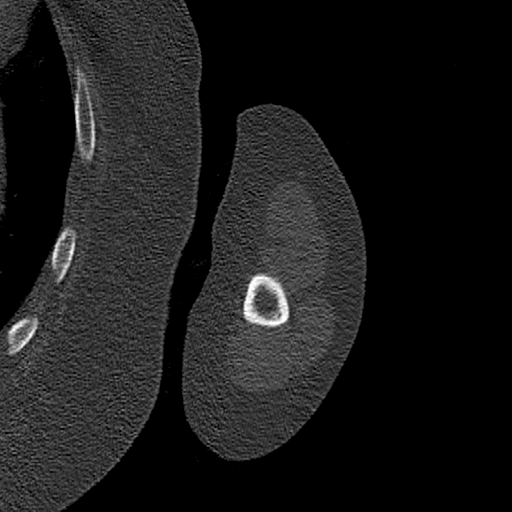
[im 132/245  bone]
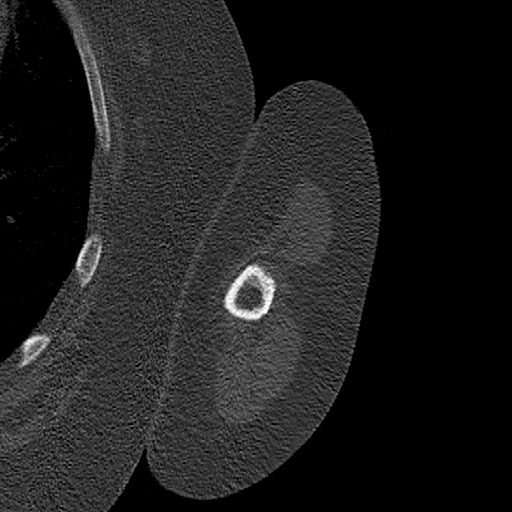
[im 151/245  soft-tissue]
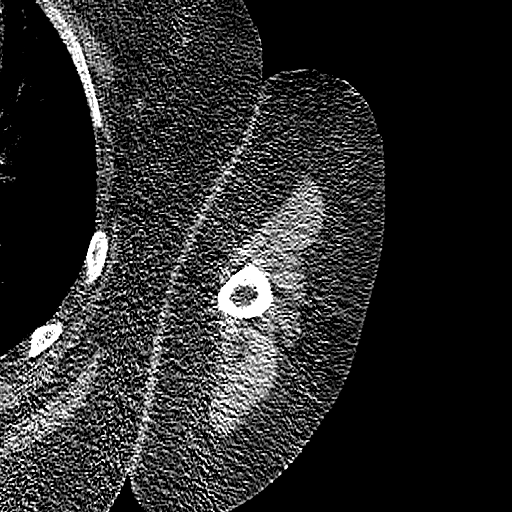
[im 151/245  bone]
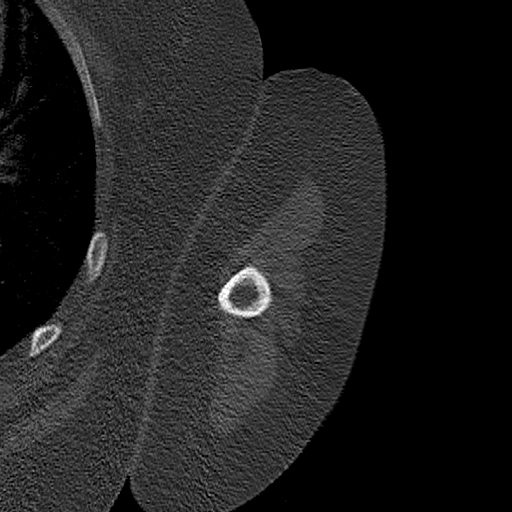
[im 188/245  bone]
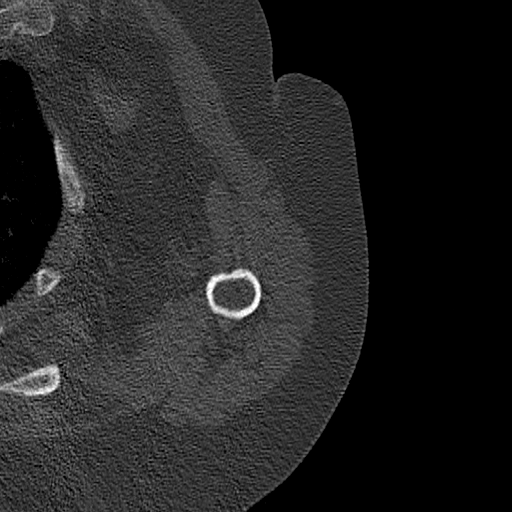
[im 226/245  bone]
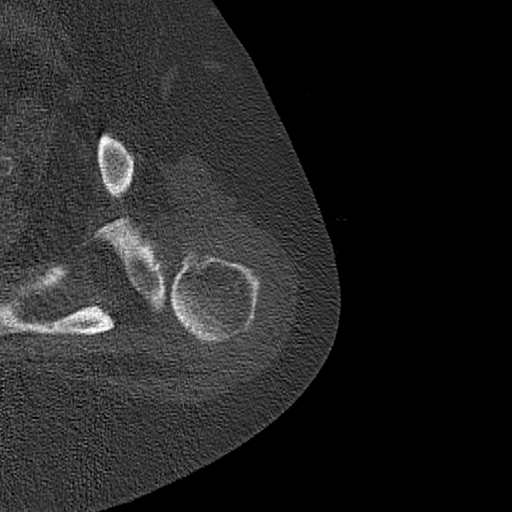

[Series 9: ax st · sagittal · 0.39mm/px · 6 of 134 slices shown]
[im 34/134  bone]
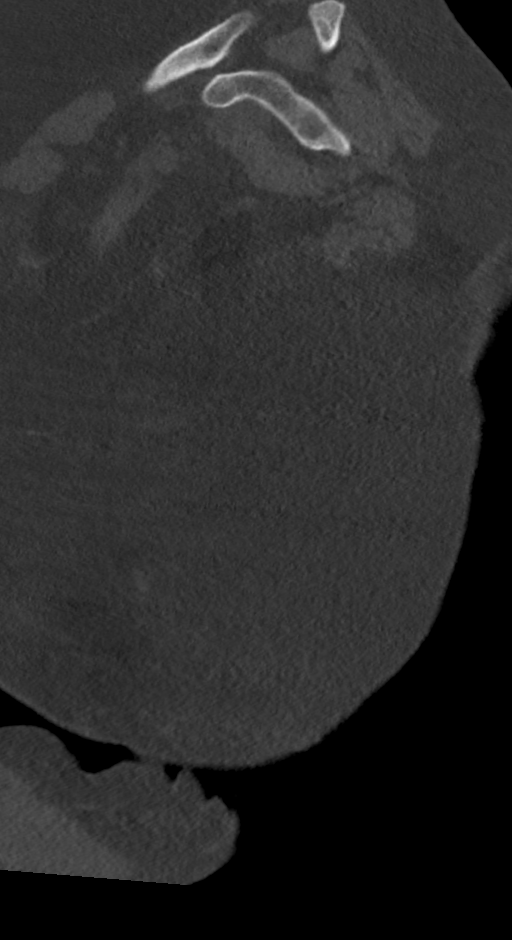
[im 47/134  bone]
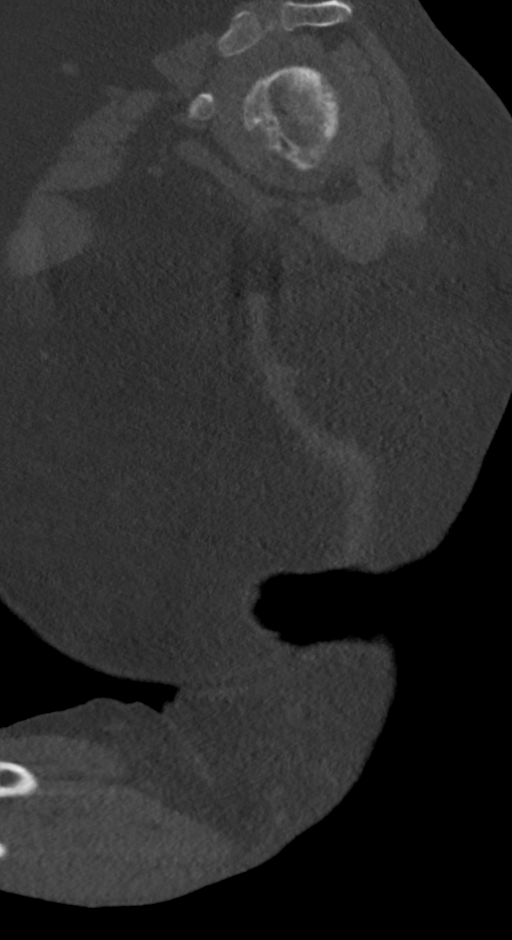
[im 53/134  soft-tissue]
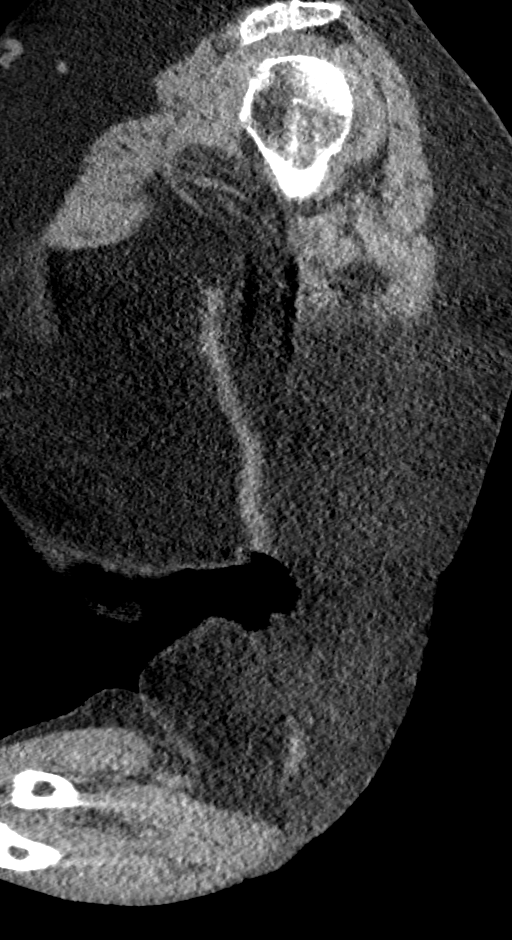
[im 59/134  bone]
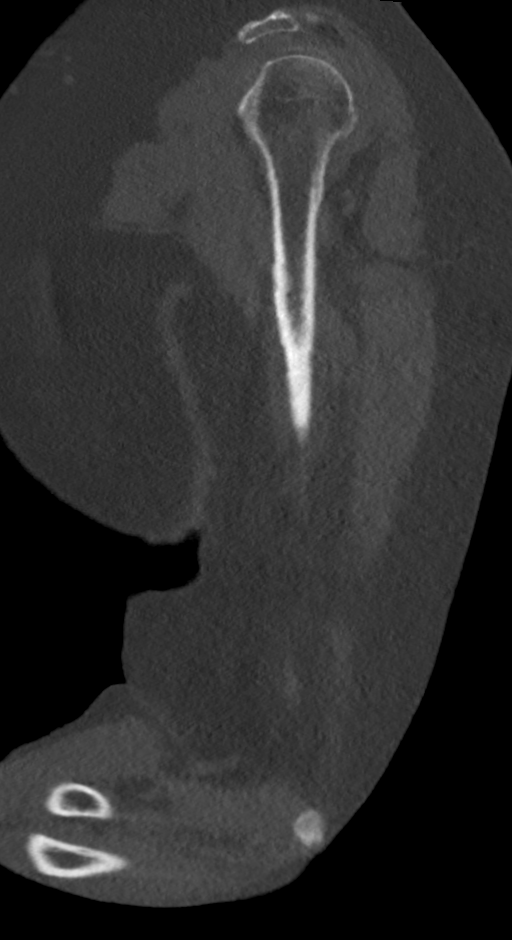
[im 72/134  bone]
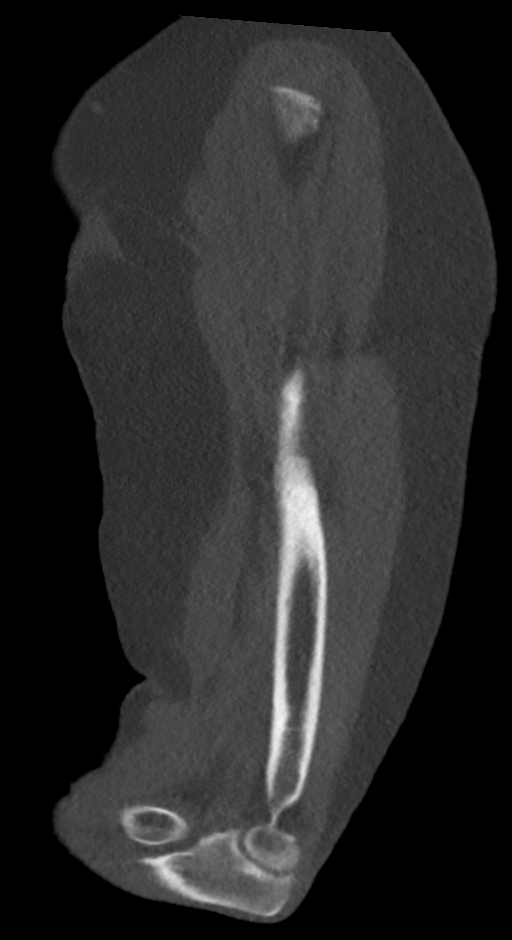
[im 84/134  bone]
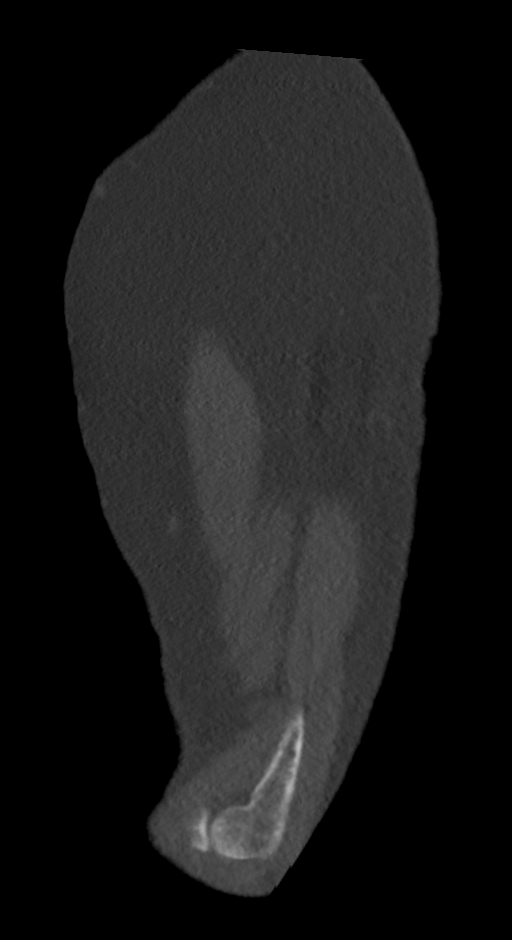

[13 of 36 positions shown; findings below may reference images not displayed]

FINDINGS: Bones/Joint/Cartilage

No evidence of acute fracture or dislocation. Moderate to severe
osteoarthritis of the glenohumeral joint as manifested by complete
joint space loss, subchondral sclerosis and cystic change and large
marginal humeral head osteophytosis. Small glenohumeral joint
effusion, nonspecific. Mild degenerative changes of the
acromioclavicular joint. Os acromiale. No appreciable
subacromial-subdeltoid fluid collection. Elbow joint appears intact
without fracture or dislocation. No elbow joint effusion. No
suspicious lytic or sclerotic bone lesion is identified.

Ligaments

Suboptimally assessed by CT.

Muscles and Tendons

Preservation of muscle bulk. No acute tendinous abnormality evident
by CT.

Soft tissues

No soft tissue fluid collection or hematoma.
IMPRESSION: 1. No acute osseous abnormality of the left humerus.
2. Moderate to severe osteoarthritis of the glenohumeral joint.
3. Small glenohumeral joint effusion, nonspecific.
4. Os acromiale.

## 2022-03-09 IMAGING — CT CT HEAD W/O CM
3 series · 16 of 47 positions shown, 19 images · non-contrast
Comparison: Head CT 07/28/2018.  Brain MRI 01/14/2005.

CLINICAL DATA: Facial trauma; fall.

EXAM:
CT HEAD WITHOUT CONTRAST
TECHNIQUE: Contiguous axial images were obtained from the base of the skull
through the vertex without intravenous contrast.

[Series 3: head wo · axial · 0.41mm/px · z∈[-194,-64]mm · 10 of 32 slices shown, 13 images]
[im 3/32  brain]
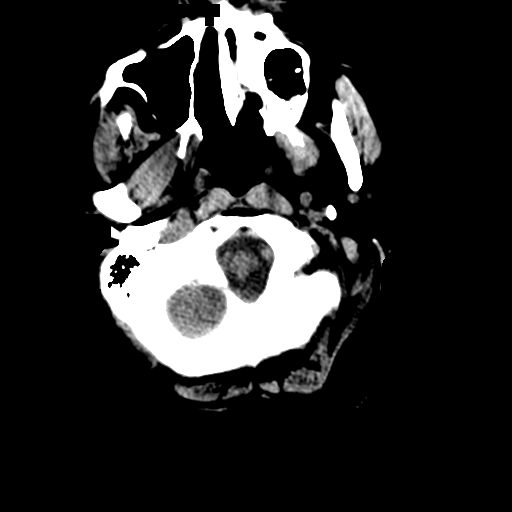
[im 3/32  bone]
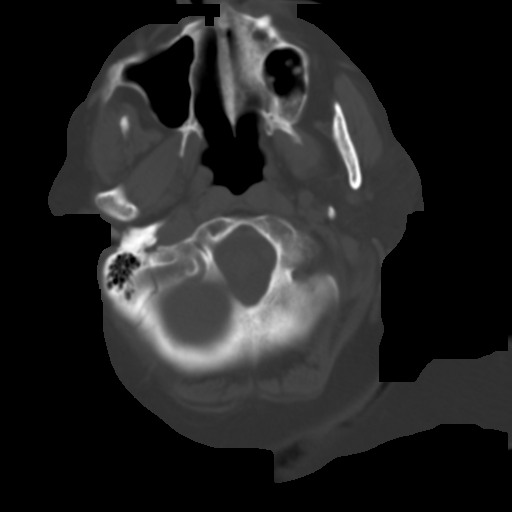
[im 6/32  brain]
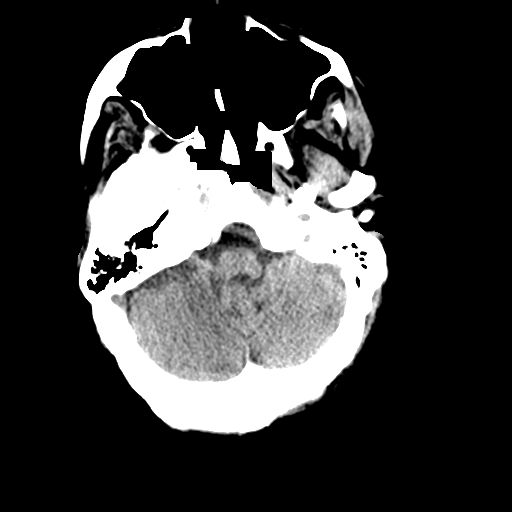
[im 9/32  brain]
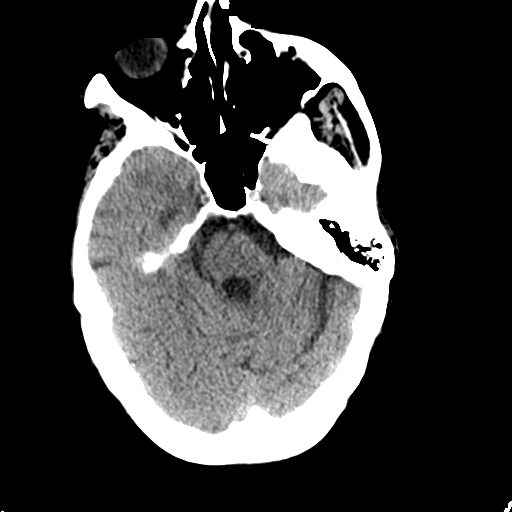
[im 11/32  brain]
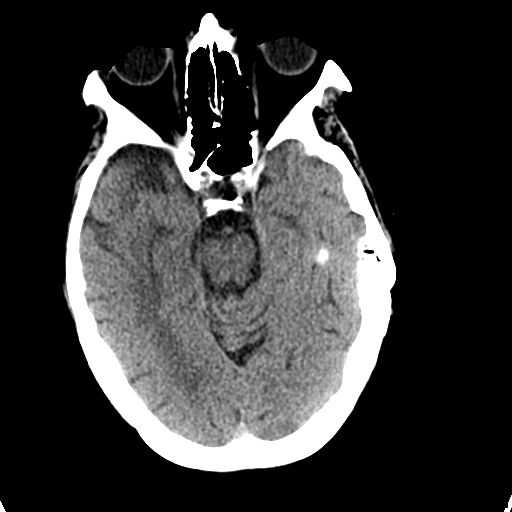
[im 14/32  brain]
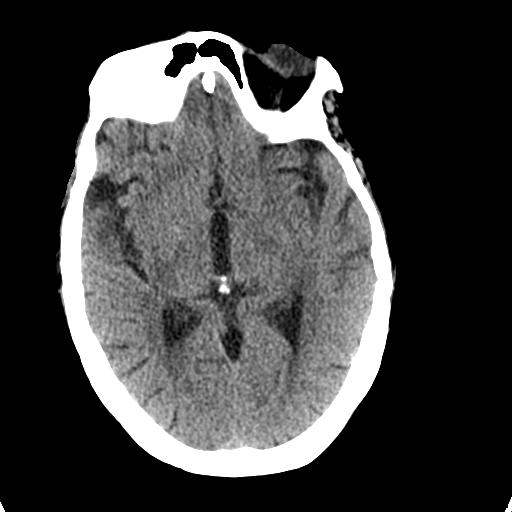
[im 14/32  bone]
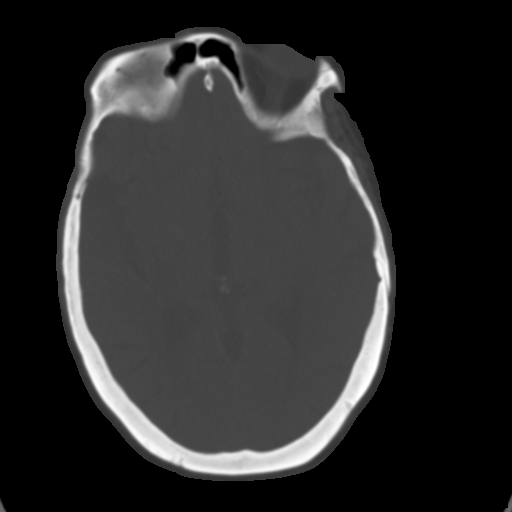
[im 18/32  brain]
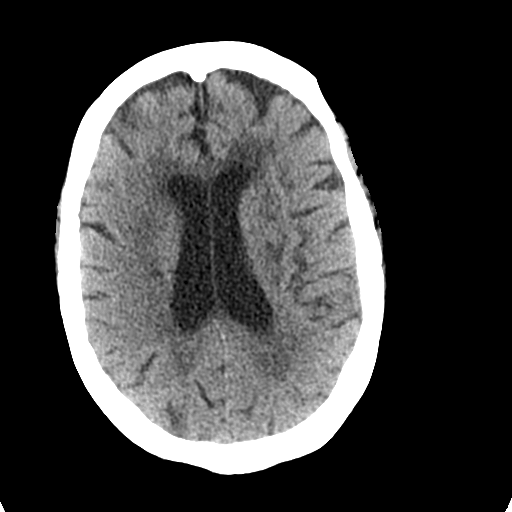
[im 21/32  brain]
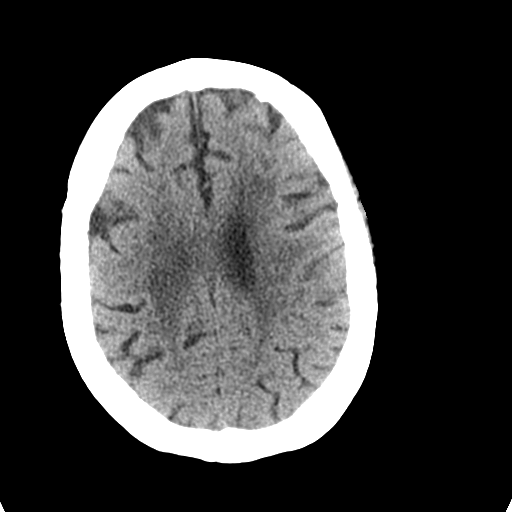
[im 24/32  brain]
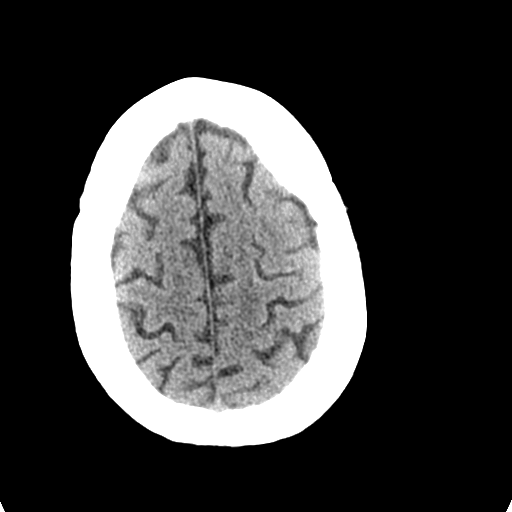
[im 26/32  brain]
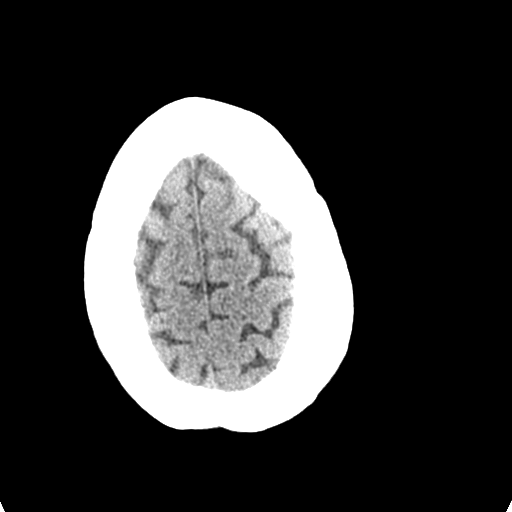
[im 26/32  bone]
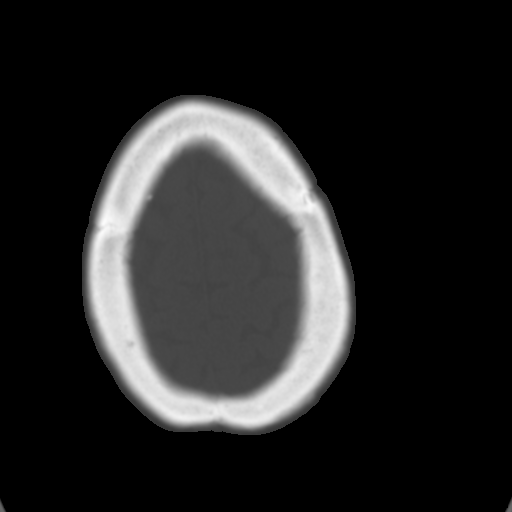
[im 29/32  brain]
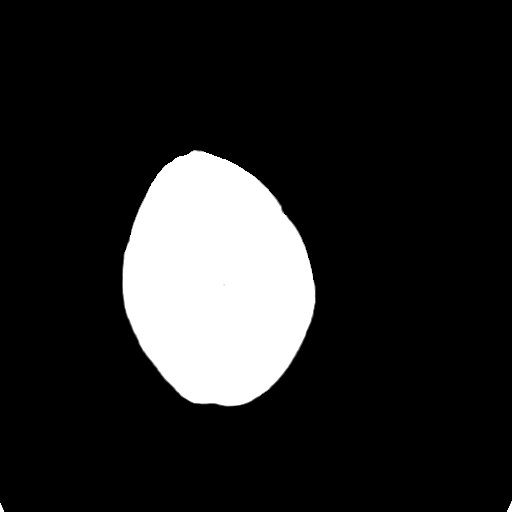

[Series 4: coronal soft tissue · coronal · 0.39mm/px · 3 of 84 slices shown]
[im 35/84  brain]
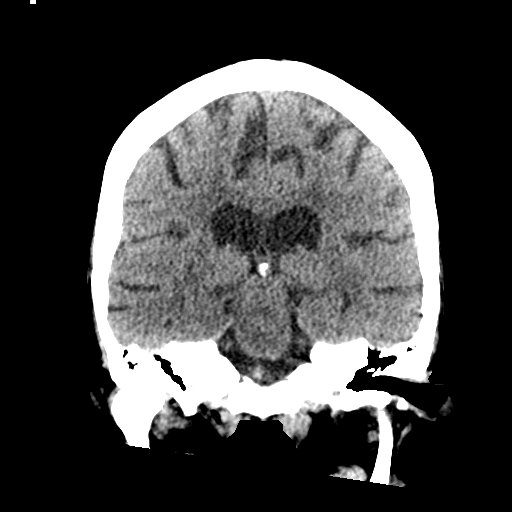
[im 40/84  brain]
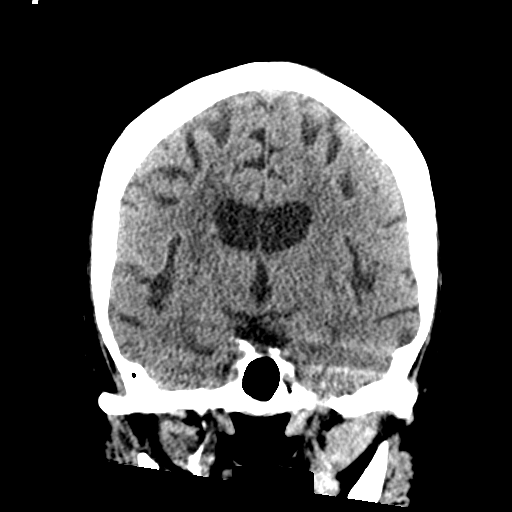
[im 45/84  brain]
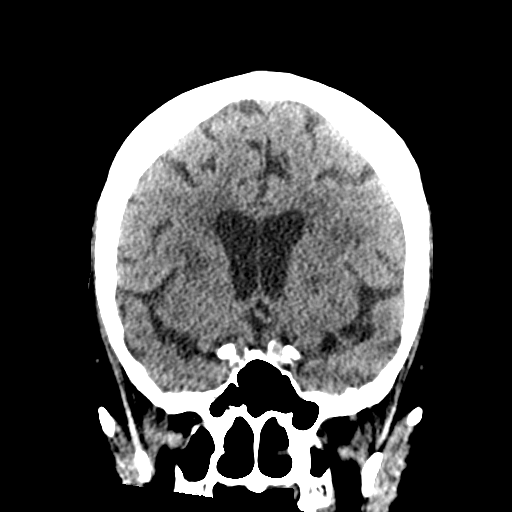

[Series 5: sagittal soft tissue · sagittal · 0.36mm/px · 3 of 55 slices shown]
[im 20/55  brain]
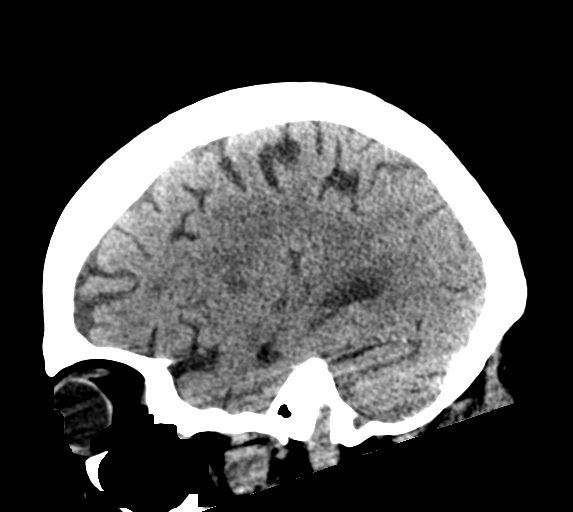
[im 28/55  brain]
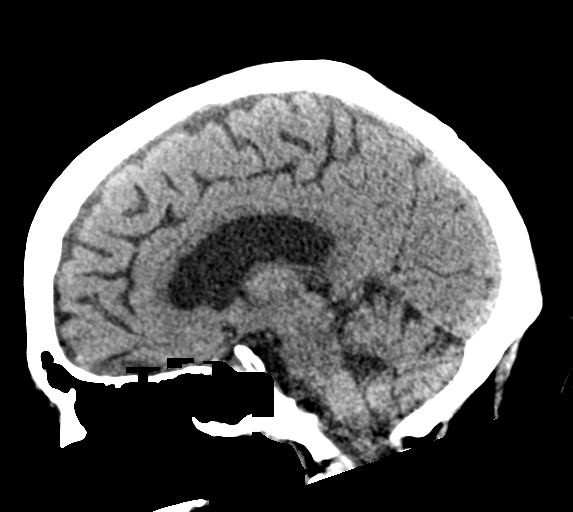
[im 35/55  brain]
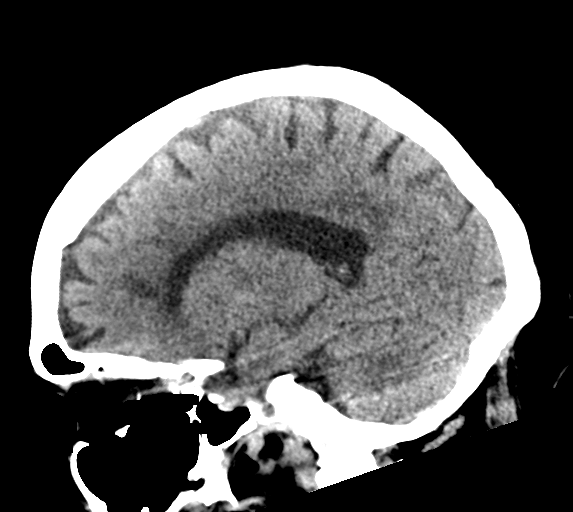

[16 of 47 positions shown; findings below may reference images not displayed]

FINDINGS: Brain:

Stable, mild generalized cerebral atrophy.

Stable moderate multifocal hypoattenuation within the cerebral white
matter is nonspecific, but consistent with chronic small vessel
ischemic disease.

Redemonstrated chronic lacunar infarcts within the left cerebral
white matter and bilateral basal ganglia.

There is no acute intracranial hemorrhage.

No demarcated cortical infarct.

No extra-axial fluid collection.

No evidence of intracranial mass.

No midline shift.

Partially empty sella turcica

Vascular: No hyperdense vessel.  Atherosclerotic calcifications

Skull: Normal. Negative for fracture or focal lesion.

Sinuses/Orbits: Visualized orbits show no acute finding. No
significant paranasal sinus disease or mastoid effusion at the
imaged levels.
IMPRESSION: No evidence of acute intracranial abnormality.

Stable mild generalized cerebral atrophy and moderate chronic small
vessel ischemic disease. Redemonstrated chronic lacunar infarcts
within the left cerebral white matter and bilateral basal ganglia.

## 2022-03-10 ENCOUNTER — Ambulatory Visit: Payer: Medicare HMO | Attending: Cardiovascular Disease | Admitting: Cardiovascular Disease

## 2022-03-10 ENCOUNTER — Encounter: Payer: Self-pay | Admitting: Cardiovascular Disease

## 2022-03-10 VITALS — BP 116/82 | HR 66 | Ht 65.5 in | Wt 265.4 lb

## 2022-03-10 DIAGNOSIS — I5032 Chronic diastolic (congestive) heart failure: Secondary | ICD-10-CM

## 2022-03-10 DIAGNOSIS — I4821 Permanent atrial fibrillation: Secondary | ICD-10-CM

## 2022-03-10 DIAGNOSIS — I10 Essential (primary) hypertension: Secondary | ICD-10-CM

## 2022-03-10 IMAGING — MR MR HEAD W/O CM
12 series · 36 of 48 positions shown · non-contrast
Comparison: Head CT yesterday.  Brain MRI 01/14/2005.

CLINICAL DATA: 69-year-old female status post fall. Weakness.
Atrial fibrillation.

EXAM:
MRI HEAD WITHOUT CONTRAST
TECHNIQUE: Multiplanar, multiecho pulse sequences of the brain and surrounding
structures were obtained without intravenous contrast.

[Series 5: ax dwi_tracew · axial · 3.0mm · 0.60mm/px · z∈[-127,+25]mm · 2 of 48 slices shown]
[im 1/48]
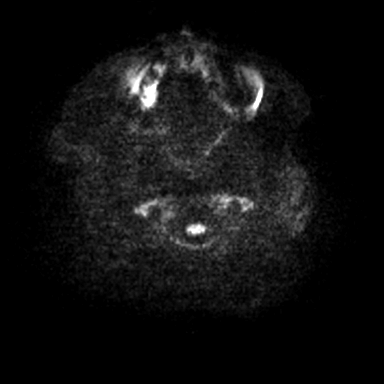
[im 48/48]
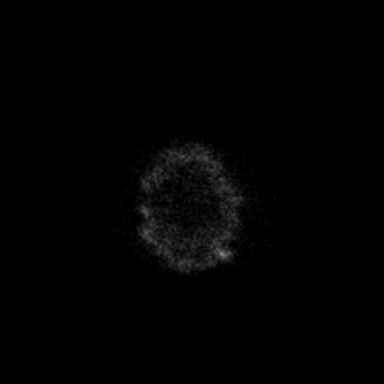

[Series 6: ax dwi_adc · axial · 3.0mm · 0.60mm/px · z∈[-127,+25]mm · 3 of 48 slices shown]
[im 1/48]
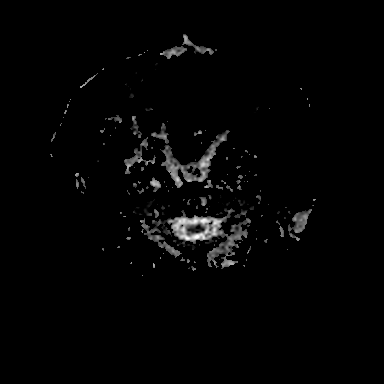
[im 24/48]
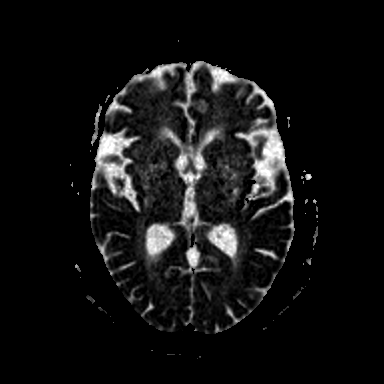
[im 48/48]
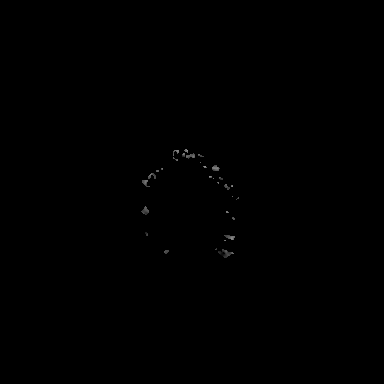

[Series 7: cor dwi_tracew · coronal · 5.0mm · 0.60mm/px · 2 of 38 slices shown]
[im 1/38]
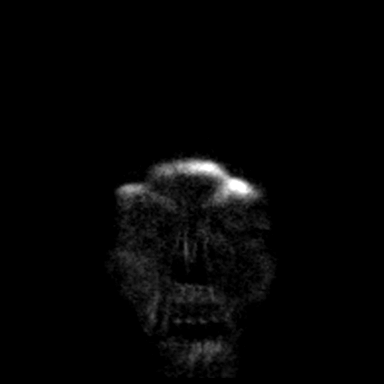
[im 38/38]
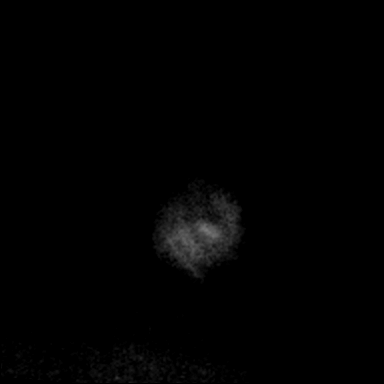

[Series 8: cor dwi_adc · coronal · 5.0mm · 0.60mm/px · 2 of 37 slices shown]
[im 1/37]
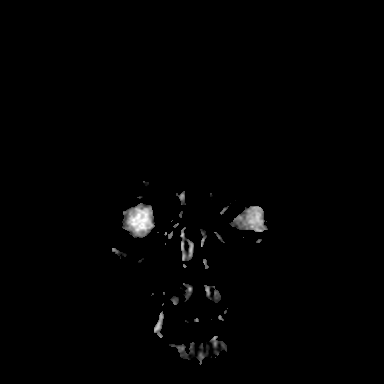
[im 37/37]
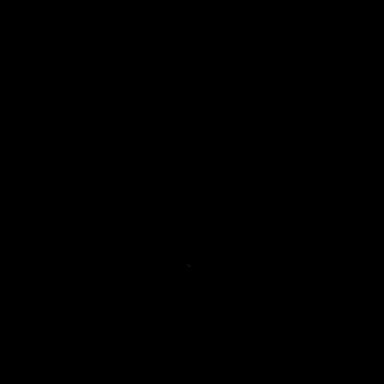

[Series 9: T1 · sagittal · 5.0mm · 0.62mm/px · 1 of 21 slices shown (1 of 2)]
[im 1/21]
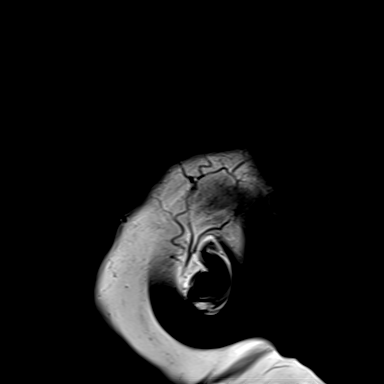

[Series 10: T2 · axial · 5.0mm · 0.45mm/px · z∈[-116,+26]mm · 2 of 25 slices shown (1 of 2)]
[im 1/25]
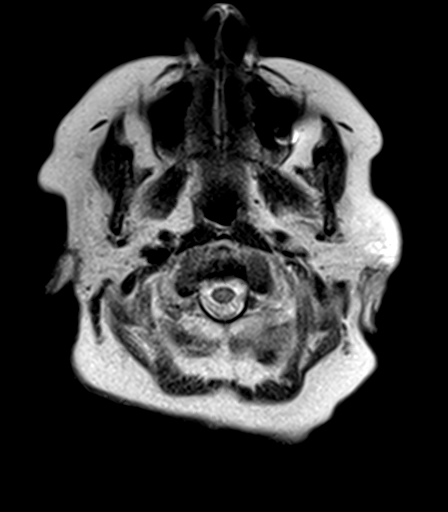
[im 25/25]
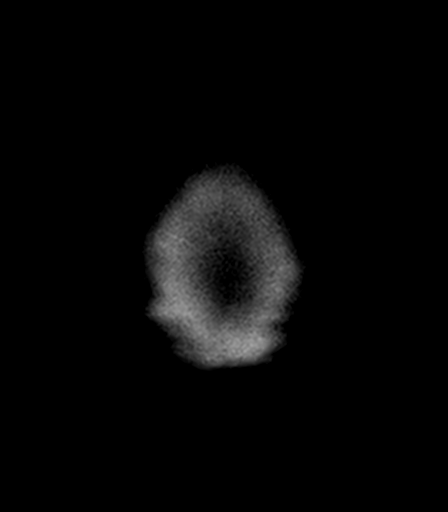

[Series 12: pha_images · axial · 3.0mm · 0.90mm/px · z∈[-130,+38]mm · 4 of 58 slices shown]
[im 1/58]
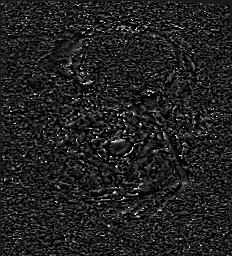
[im 20/58]
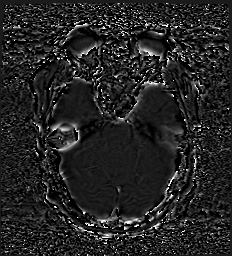
[im 39/58]
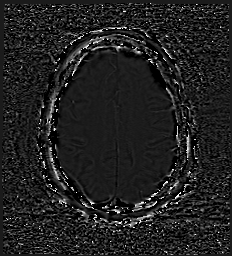
[im 58/58]
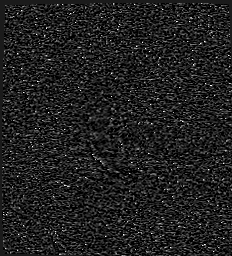

[Series 13: swi_images · axial · 3.0mm · 0.90mm/px · z∈[-133,+41]mm · 4 of 60 slices shown]
[im 1/60]
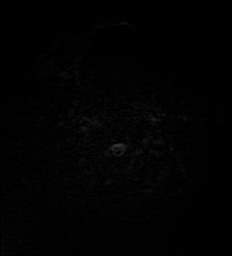
[im 20/60]
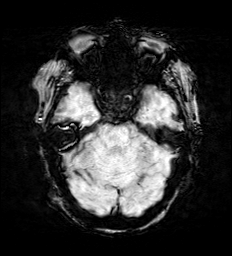
[im 40/60]
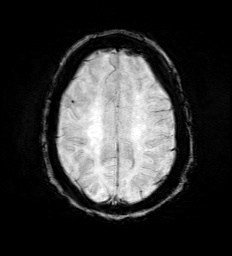
[im 60/60]
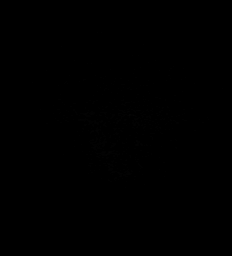

[Series 15: FLAIR · axial · 3.0mm · 0.53mm/px · z∈[-126,+33]mm · 3 of 55 slices shown]
[im 1/55]
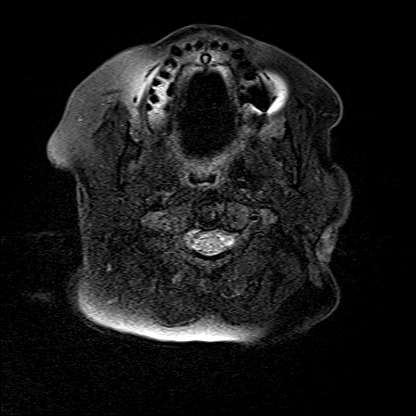
[im 28/55]
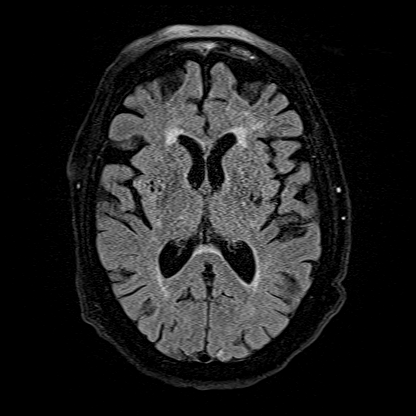
[im 55/55]
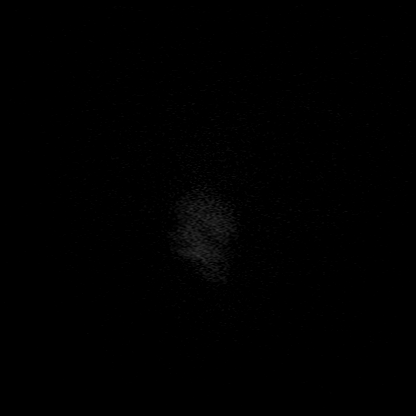

[Series 16: T1 · axial · 1.0mm · 0.98mm/px · z∈[-126,+30]mm · 8 of 160 slices shown (2 of 2)]
[im 1/160]
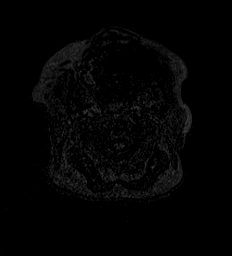
[im 18/160]
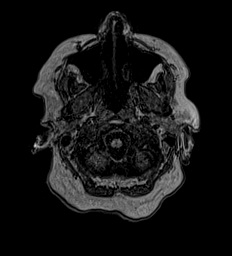
[im 54/160]
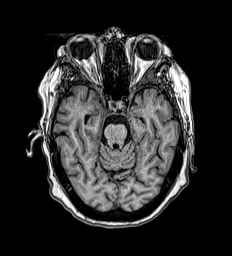
[im 71/160]
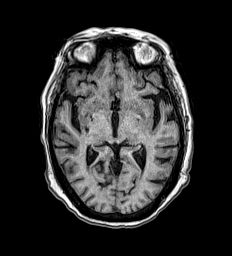
[im 89/160]
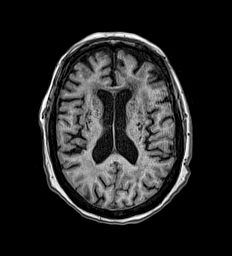
[im 107/160]
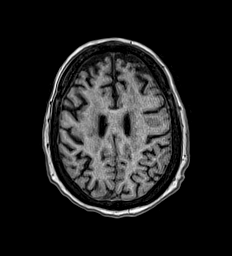
[im 142/160]
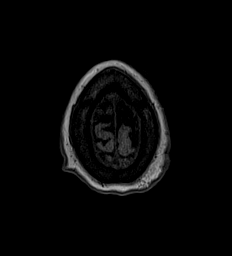
[im 160/160]
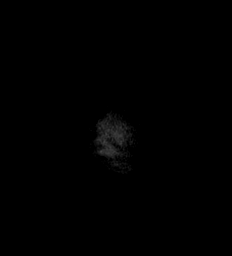

[Series 17: T2 · coronal · 5.0mm · 0.45mm/px · 2 of 29 slices shown (2 of 2)]
[im 1/29]
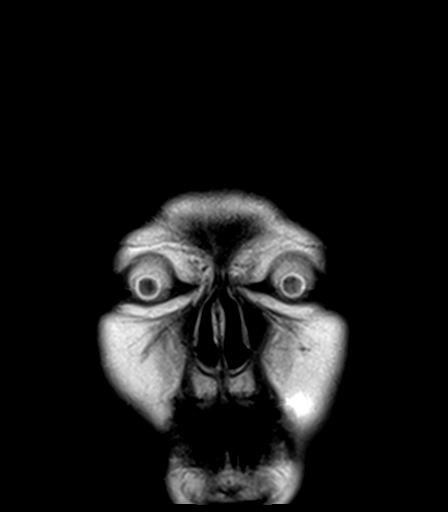
[im 29/29]
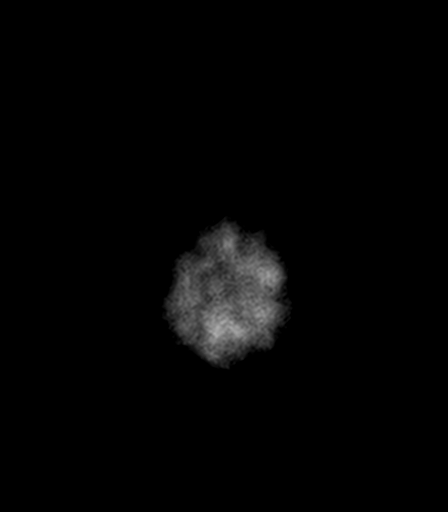

[Series 18: TOF · axial · 0.5mm · 0.41mm/px · z∈[-109,-75]mm · 3 of 205 slices shown]
[im 1/205]
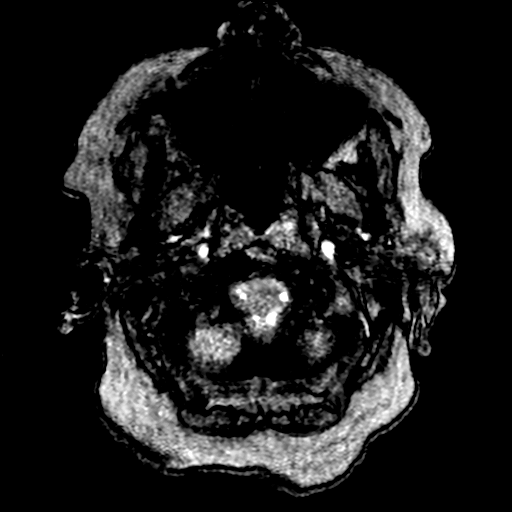
[im 35/205]
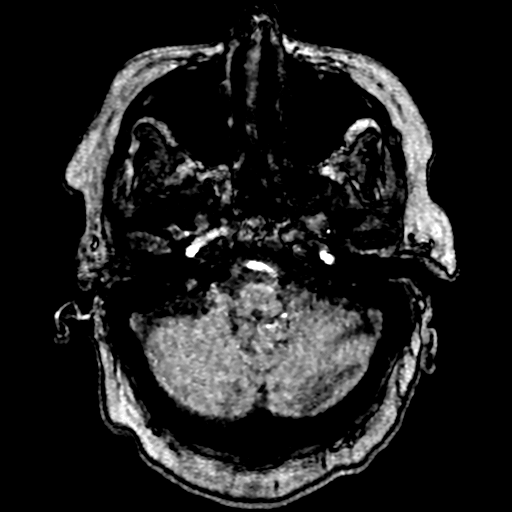
[im 69/205]
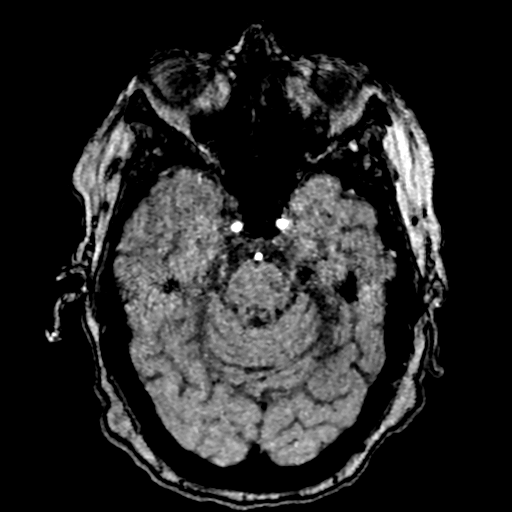

[36 of 48 positions shown; findings below may reference images not displayed]

FINDINGS: Brain: There are 4 small foci of restricted diffusion identified in
the bilateral cerebral hemispheres. The largest measures about 10 mm
in the posterior right centrum semiovale (series 7, image 16). There
are also punctate foci of cortical restricted diffusion in the
contralateral left parietal lobe, right lateral parietal lobe, left
occipital lobe.

Mild if any associated T2 and FLAIR hyperintensity in those areas
with no acute hemorrhage or mass effect. Patchy and confluent
bilateral cerebral white matter T2 and FLAIR hyperintensity plus
occasional small areas of cortical encephalomalacia (right parietal
lobe series 15, image 36), dilated perivascular spaces throughout
the deep gray nuclei, probably several chronic deep gray nuclei
lacunar infarcts, chronic lacune of the central pons, and a small
number of chronic microhemorrhages in the deep gray nuclei (thalami
series 13, images 30 and 31).

No superimposed midline shift, mass effect, evidence of mass lesion,
ventriculomegaly, extra-axial collection or acute intracranial
hemorrhage. Cervicomedullary junction and pituitary are within
normal limits.

Vascular: Major intracranial vascular flow voids are stable since
7118.

Skull and upper cervical spine: Negative visible cervical spine.
Bone marrow signal appears stable since 7118 and within normal
limits.

Sinuses/Orbits: Negative.

Other: Mastoids are clear. Grossly normal visible internal auditory
structures. Negative visible scalp and face.
IMPRESSION: 1. Positive for several small acute infarcts in the both cerebral
hemispheres - the largest in the posterior right centrum semiovale
is 10 mm. No associated hemorrhage or mass effect.

This pattern could indicate a recent embolic event in the setting of
atrial fibrillation, or might reflect synchronous small vessel
disease (#2).

2. Underlying advanced chronic small vessel disease, including
occasional small chronic cortical infarcts and several chronic
microhemorrhages in the bilateral deep gray nuclei.

## 2022-03-10 IMAGING — MR MR MRA HEAD W/O CM
1 series · 19 of 48 positions shown · non-contrast
Comparison: MRI today reported separately.

CLINICAL DATA: 69-year-old female status post fall. Weakness.
Atrial fibrillation. Several small acute infarcts on MRI today.

EXAM:
MRA HEAD WITHOUT CONTRAST
TECHNIQUE: Angiographic images of the Circle of Willis were obtained using MRA
technique without intravenous contrast.

[Series 18: TOF · axial · 0.5mm · 0.41mm/px · z∈[-109,-13]mm · 19 of 205 slices shown]
[im 1/205]
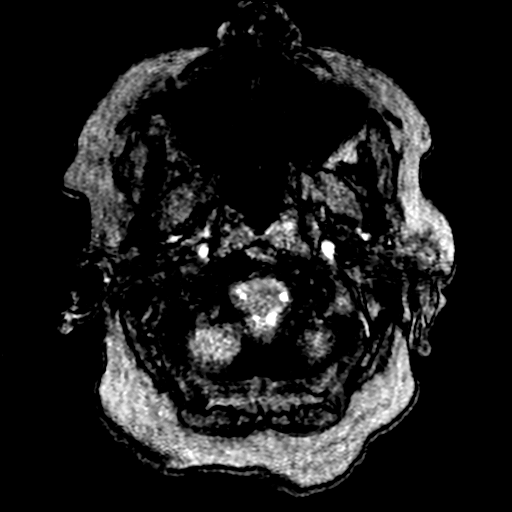
[im 5/205]
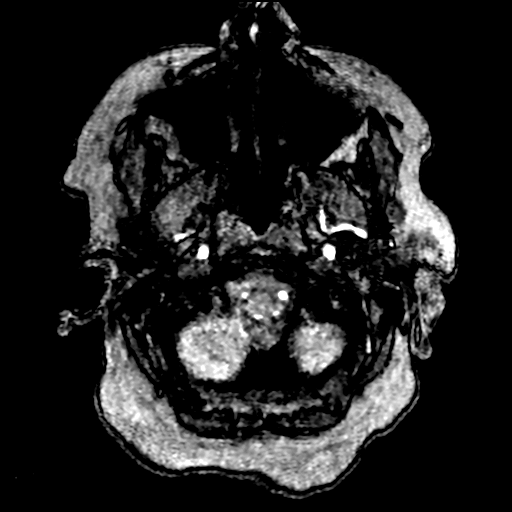
[im 9/205]
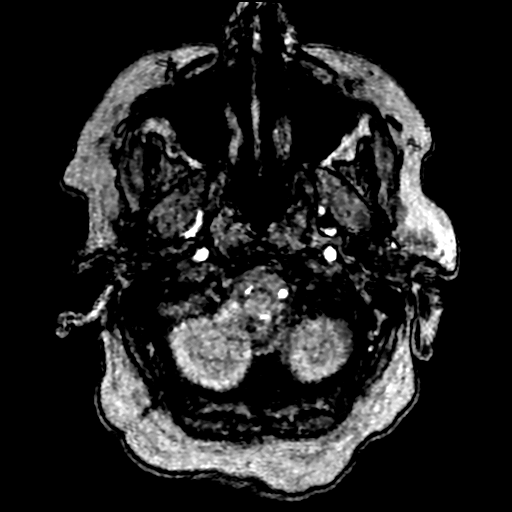
[im 14/205]
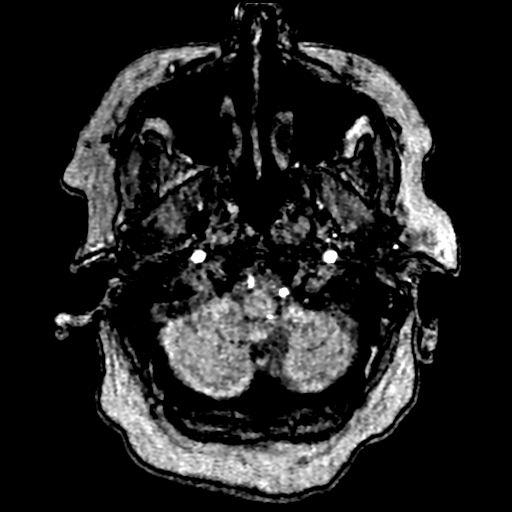
[im 18/205]
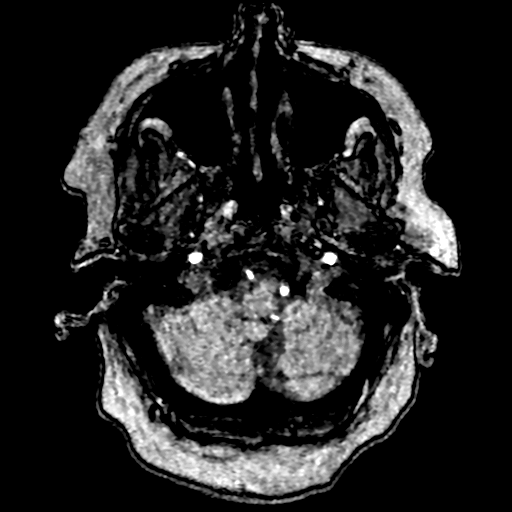
[im 22/205]
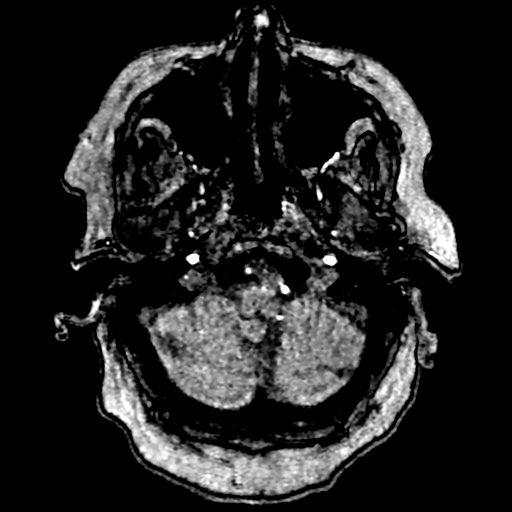
[im 27/205]
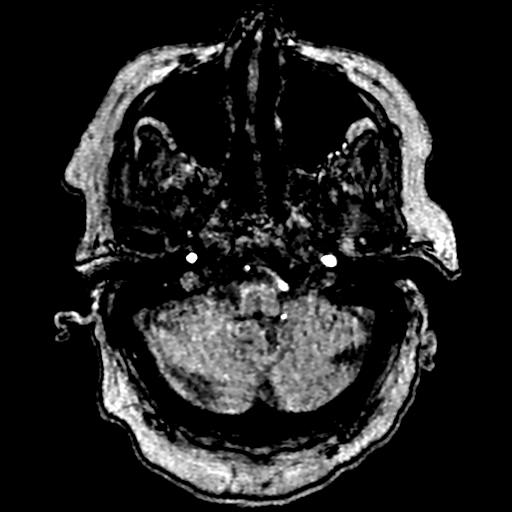
[im 31/205]
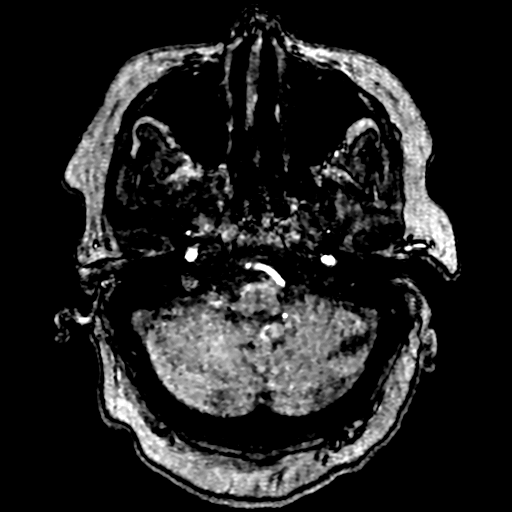
[im 35/205]
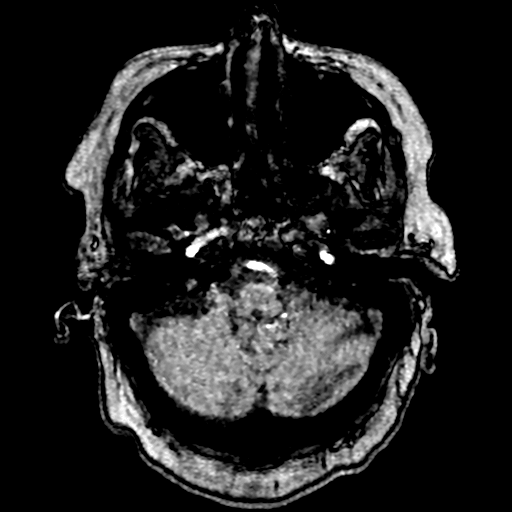
[im 40/205]
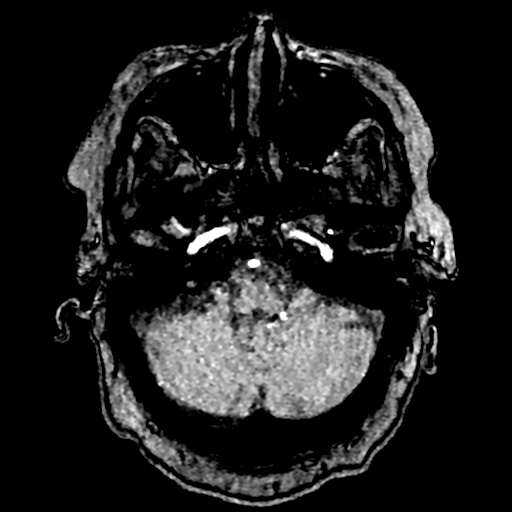
[im 44/205]
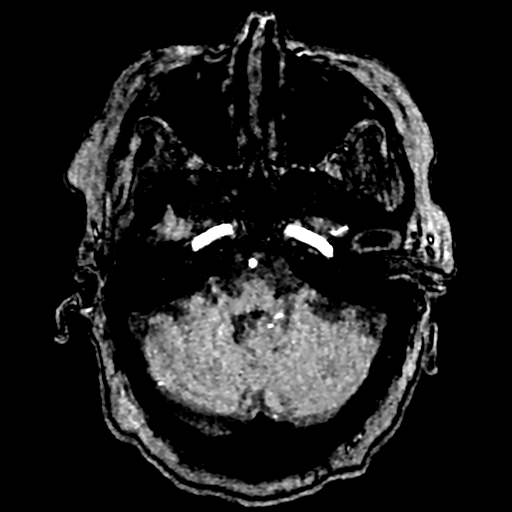
[im 66/205]
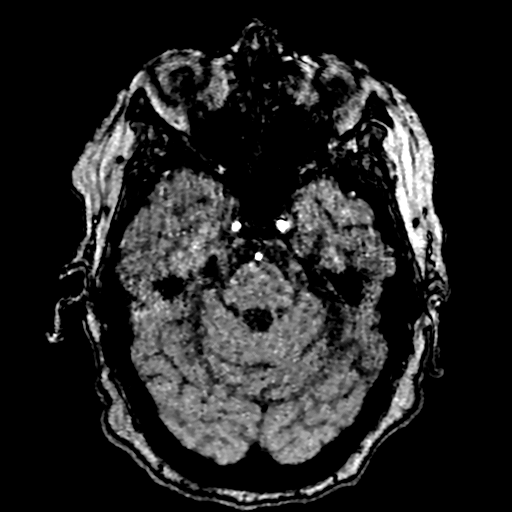
[im 92/205]
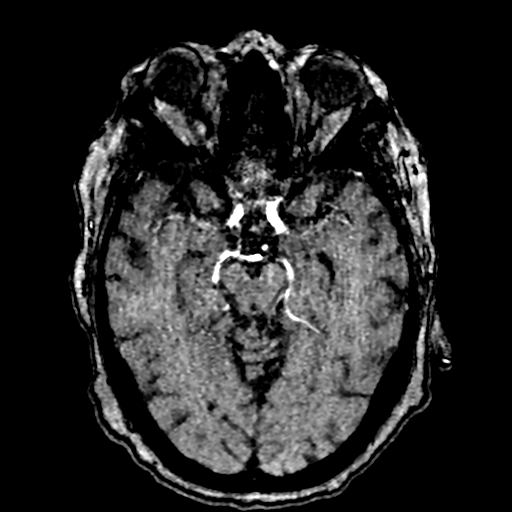
[im 105/205]
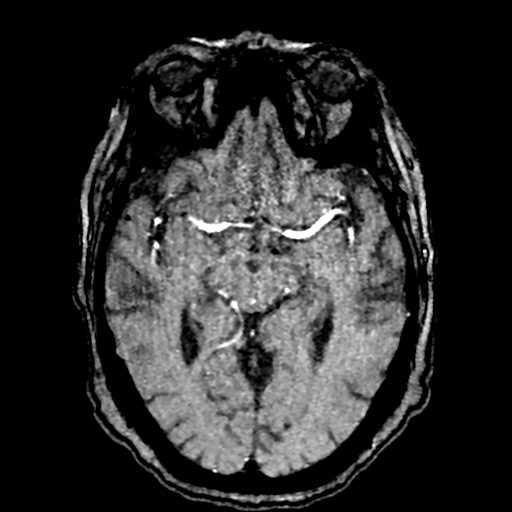
[im 118/205]
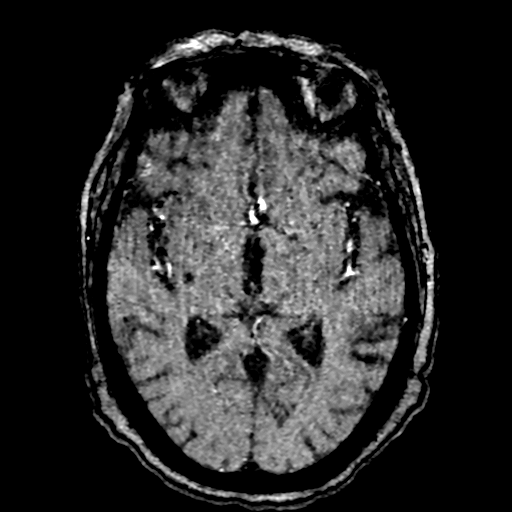
[im 144/205]
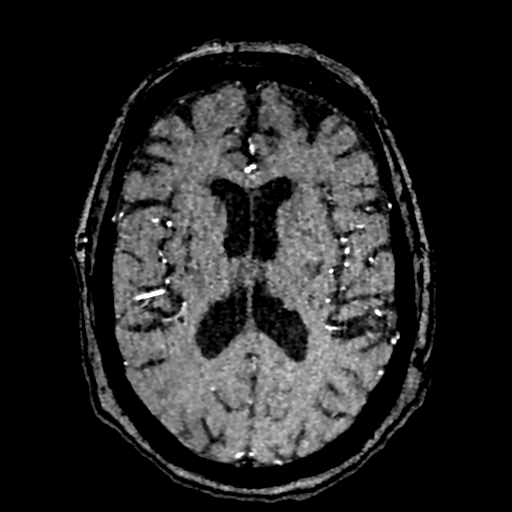
[im 170/205]
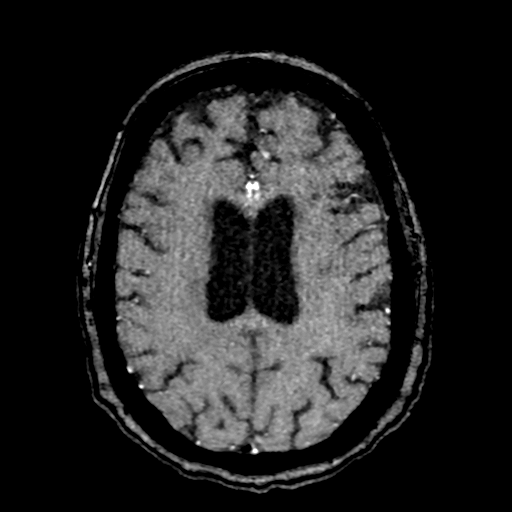
[im 174/205]
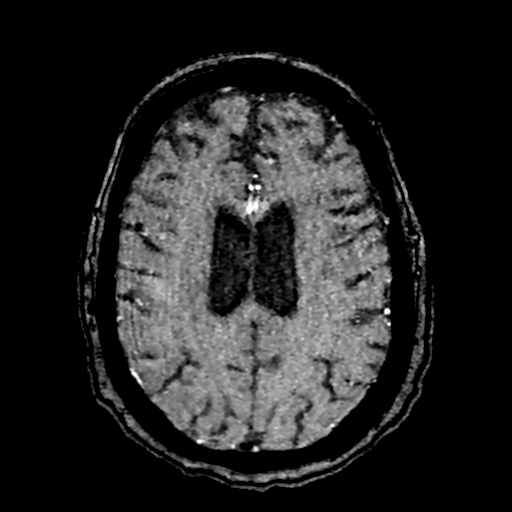
[im 196/205]
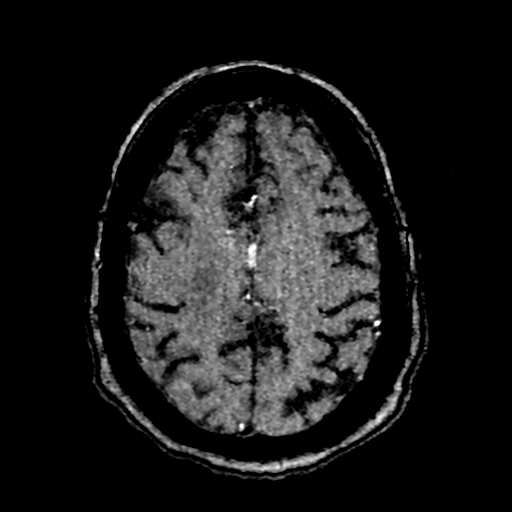

[19 of 48 positions shown; findings below may reference images not displayed]

FINDINGS: Mildly motion degraded.

Antegrade flow in the posterior circulation with dominant left
vertebral artery. Patent vertebrobasilar junction. No distal
vertebral or basilar stenosis. Patent SCA and PCA origins, with a
fetal type left PCA origin and small right posterior communicating
artery. Bilateral PCA branches are within normal limits when
allowing for motion.

Antegrade flow in both ICA siphons. No siphon stenosis. Infundibulum
suspected at the origin of the left ophthalmic artery (normal
variant). Posterior communicating artery origins are within normal
limits. Patent carotid termini.

Patent MCA M1 and ACA A1 segments, the left A1 is dominant. Anterior
communicating artery and visible ACA branches are within normal
limits. Both carotid bifurcations are patent. No MCA branch
occlusion is identified.
IMPRESSION: 1. Negative for large vessel occlusion.
2. Some circle-of-Willis branch detail is degraded by motion, but
relatively mild for age intracranial atherosclerosis is suspected.
3. Infundibulum suspected at the left ophthalmic artery origin
(normal variant).

## 2022-03-10 NOTE — Patient Instructions (Signed)
Medication Instructions:  No changes *If you need a refill on your cardiac medications before your next appointment, please call your pharmacy*   Lab Work: None ordered If you have labs (blood work) drawn today and your tests are completely normal, you will receive your results only by: MyChart Message (if you have MyChart) OR A paper copy in the mail If you have any lab test that is abnormal or we need to change your treatment, we will call you to review the results.   Testing/Procedures: None ordered   Follow-Up: At [facility], you and your health needs are our priority.  As part of our continuing mission to provide you with exceptional heart care, we have created designated Provider Care Teams.  These Care Teams include your primary Cardiologist (physician) and Advanced Practice Providers (APPs -  Physician Assistants and Nurse Practitioners) who all work together to provide you with the care you need, when you need it.  We recommend signing up for the patient portal called "MyChart".  Sign up information is provided on this After Visit Summary.  MyChart is used to connect with patients for Virtual Visits (Telemedicine).  Patients are able to view lab/test results, encounter notes, upcoming appointments, etc.  Non-urgent messages can be sent to your provider as well.   To learn more about what you can do with MyChart, go to [email or web address].    Your next appointment:   6 month(s)  The format for your next appointment:   In Person  Provider:   You may see Muhammad Arida, MD or one of the following Advanced Practice Providers on your designated Care Team:   Christopher Berge, NP Ryan Dunn, PA-C Cadence Furth, PA-C Sheri Hammock, NP    Important Information About Sugar       

## 2022-03-10 NOTE — Progress Notes (Signed)
Cardiology Office Note   Date:  03/10/2022   ID:  [name], DOB 10/14/1950, MRN [id number]  PCP:  Carollee Leitz, MD  Cardiologist:   Kathlyn Sacramento, MD   Chief Complaint  Patient presents with   Follow-up    1 month f/u, no new cardiac concerns      History of Present Illness: [name] is a 71 y.o. adult who presents for a follow-up visit regarding atrial fibrillation and chronic diastolic heart failure. She has chronic medical conditions that include diabetes mellitus, hypertension, hyperlipidemia, obesity, sleep apnea on CPAP and previous DVT. She also has sleep apnea and has been on CPAP for many years.  She has chronic left foot ulceration.  There was no evidence of peripheral arterial disease on noninvasive Doppler as well as an angiogram done in 2020. She had a nuclear stress test done in January 2020 which showed no evidence of ischemia. She had recurrent small strokes on anticoagulation in 2021 in 2022.  She was switched from Pradaxa to Eliquis. She was hospitalized in March 2023 with lower extremity cellulitis and acute on chronic diastolic heart failure. She was seen in November in our office for increased lower extremity edema, dyspnea and weight gain.  The dose of furosemide was increased to 40 mg once daily with subsequent improvement in her symptoms but her weight never went back to her baseline.  She has been doing reasonably well and reports stable dyspnea and no chest pain.  No orthopnea.  She is mostly bothered by severe left shoulder surgery.  She thinks she needs to have surgery done on the left shoulder similar to what she had on the right shoulder but she was told that she might not be a candidate given her previous strokes.  She walks with a walker due to instability.  Past Medical History:  Diagnosis Date   Abnormal antibody titer    Anxiety and depression    Arthritis    Asthma    Atypical chest pain    a. 03/2018 MV: No ischemia/infarct. EF  >65%.   Carotid arterial disease ([name])    a. 05/2200 U/S: RICA <54, LICA nl.   Chronic heart failure with preserved ejection fraction (HFpEF) ([name])    a. 08/2013 Echo: EF 60-65%; b. 08/2019 Echo: EF 60-65%; c. 03/2020 Echo: EF 60-65%; d. 05/2021 Echo: EF 55-60%, no rmwa, nl RV fxn, RVSP 54.75mHg. Mildly dil LA, mild MR, mod TR.   CKD (chronic kidney disease), stage III (HCC)    Cystocele    Depression    Diabetes ([place]    DVT (deep venous thrombosis) (HCC)    Righ calf   Dyspnea    11/08/2018 - "more now due to lack of exercise"   Edema    GERD (gastroesophageal reflux disease)    Heart murmur    History of IBS    History of kidney stones    History of methicillin resistant staphylococcus aureus (MRSA) 2008   History of multiple strokes    Hyperlipidemia    Hypertension    Hypothyroidism    Insomnia    Kidney stone    kidney stones with lithotripsy .  [facility] "adrenal glands"   Mitral regurgitation    a. 05/2021 Echo: Mild MR.   Neuropathy involving both lower extremities    Non-healing ulcer of left foot (HCC)    Obesity, Class II, BMI 35-39.9, with comorbidity    PAF (paroxysmal atrial fibrillation) ([facility]  a. 07/2013 Event monitor: Freq PVCs and 3 short runs of Afib-->CHA2DS2VASc = 7-->Eliquis.   PAH (pulmonary artery hypertension) ([place])    a. 05/2021 Echo: RVSP 54.12mHg.   Peripheral vascular disease ([name]    a. 03/2018 LE Angio: Nl renal arteries. Nl aorta & iliacs. Nl LLE arterial flow; b. 06/2020 ABI's: Nl bilat.   Sleep apnea    use C-PAP   Stroke ([name]    03/2020   Tricuspid regurgitation    a. 05/2021 Echo: Mod TR (in setting of RVSP 54.573mg).   Urinary incontinence    Venous stasis dermatitis of both lower extremities     Past Surgical History:  Procedure Laterality Date   ANKLE SURGERY Right    APPLICATION OF WOUND VAC Left 06/27/2015   Procedure: APPLICATION OF WOUND VAC ( POSSIBLE ) ;  Surgeon: JaAlgernon HuxleyMD;  Location: [facility];  Service: Vascular;   Laterality: Left;   APPLICATION OF WOUND VAC Left 09/25/2016   Procedure: APPLICATION OF WOUND VAC;  Surgeon: TrAlbertine PatriciaDPM;  Location: [facility];  Service: Podiatry;  Laterality: Left;   arm surgery     right     CHOLECYSTECTOMY     COLONOSCOPY     COLONOSCOPY WITH PROPOFOL N/A 01/11/2017   Procedure: COLONOSCOPY WITH PROPOFOL;  Surgeon: SkLollie SailsMD;  [facility];  Service: Endoscopy;  Laterality: N/A;   COLONOSCOPY WITH PROPOFOL N/A 02/22/2017   Procedure: COLONOSCOPY WITH PROPOFOL;  Surgeon: SkLollie SailsMD;  [facility];  Service: Endoscopy;  Laterality: N/A;   COLONOSCOPY WITH PROPOFOL N/A 05/31/2017   Procedure: COLONOSCOPY WITH PROPOFOL;  Surgeon: SkLollie SailsMD;  [facility]  Service: Endoscopy;  Laterality: N/A;   COLONOSCOPY WITH PROPOFOL N/A 01/26/2022   Procedure: COLONOSCOPY WITH PROPOFOL;  Surgeon: Dawn RubensteinMD;  Location: [facility];  Service: Endoscopy;  Laterality: N/A;   ESOPHAGOGASTRODUODENOSCOPY (EGD) WITH PROPOFOL N/A 01/11/2017   Procedure: ESOPHAGOGASTRODUODENOSCOPY (EGD) WITH PROPOFOL;  Surgeon: SkLollie SailsMD;  [facility];  Service: Endoscopy;  Laterality: N/A;   ESOPHAGOGASTRODUODENOSCOPY (EGD) WITH PROPOFOL N/A 10/25/2018   Procedure: ESOPHAGOGASTRODUODENOSCOPY (EGD) WITH PROPOFOL;  Surgeon: SkLollie SailsMD;  [facility];  Service: Endoscopy;  Laterality: N/A;   ESOPHAGOGASTRODUODENOSCOPY (EGD) WITH PROPOFOL N/A 11/29/2019   Procedure: ESOPHAGOGASTRODUODENOSCOPY (EGD) WITH PROPOFOL;  Surgeon: Tol[place], Dawn PikeMD;  Location: [facility];  Service: Gastroenterology;  Laterality: N/A;   ESOPHAGOGASTRODUODENOSCOPY (EGD) WITH PROPOFOL N/A 01/26/2022   Procedure: ESOPHAGOGASTRODUODENOSCOPY (EGD) WITH PROPOFOL;  Surgeon: Dawn RubensteinMD;  Location: [facility];  Service: Endoscopy;  Laterality: N/A;  DM   HERNIA REPAIR     umbilical    HIATAL HERNIA REPAIR     I & D EXTREMITY Left 06/27/2015   Procedure: IRRIGATION AND DEBRIDEMENT EXTREMITY            ( CALF HEMATOMA ) POSSIBLE WOUND VAC;  Surgeon: JaAlgernon HuxleyMD;  Location: [facility];  Service: Vascular;  Laterality: Left;   INCISION AND DRAINAGE OF WOUND Left 09/25/2016   Procedure: IRRIGATION AND DEBRIDEMENT - PARTIAL RESECTION OF ACHILLES TENDON WITH WOUND VAC APPLICATION;  Surgeon: TrAlbertine PatriciaDPM;  Location: [facility];  Service: Podiatry;  Laterality: Left;   INCISION AND DRAINAGE OF WOUND Left 11/09/2018   Procedure: Excision of left foot wound with ACell placement;  Surgeon: DiWallace GoingDO;  [facility] Service: [facility];  Laterality: Left;   IRRIGATION AND DEBRIDEMENT ABSCESS  Left 09/07/2016   Procedure: IRRIGATION AND DEBRIDEMENT ABSCESS LEFT HEEL;  Surgeon: [name], DPM;  Location: [facility];  Service: Podiatry;  Laterality: Left;   JOINT REPLACEMENT  2013   left knee replacement   KIDNEY SURGERY Right    kidney stones   LITHOTRIPSY     LOWER EXTREMITY ANGIOGRAPHY Left 04/04/2018   Procedure: LOWER EXTREMITY ANGIOGRAPHY;  Surgeon: [name], MD;  Location: [place] [facility];  Service: Cardiovascular;  Laterality: Left;   NM GATED MYOVIEW [facility] HX)  February 2017   Likely breast attenuation. LOW RISK study. Normal EF 55-60%.   OTHER SURGICAL HISTORY     08/2016 or 09/2016 surgery on achilles tenso h/o staph infection heal. Dr. [name]   removal of left hematoma Left    leg   REVERSE SHOULDER ARTHROPLASTY Right 10/08/2015   Procedure: REVERSE SHOULDER ARTHROPLASTY;  Surgeon: [name], MD;  Location: [facility];  Service: Orthopedics;  Laterality: Right;   SLEEVE GASTROPLASTY     TONSILLECTOMY     TOTAL KNEE ARTHROPLASTY Left    TOTAL SHOULDER REPLACEMENT Right    TRANSESOPHAGEAL ECHOCARDIOGRAM  08/08/2013   Mild LVH, EF 60-65%. Moderate LA dilation and mild RA dilation. Mild MR with no evidence of stenosis and no evidence of  endocarditis. A false chordae is noted.   TRANSTHORACIC ECHOCARDIOGRAM  08/03/2013   Mild-Moderate concentric LVH, EF 60-65%. Normal diastolic function. Mild LA dilation. Mild MR with possible vegetation  - not confirmed on TEE    ULNAR NERVE TRANSPOSITION Right 08/06/2016   Procedure: ULNAR NERVE DECOMPRESSION/TRANSPOSITION;  Surgeon: [name], MD;  Location: [facility];  Service: Orthopedics;  Laterality: Right;   UPPER GI ENDOSCOPY     VAGINAL HYSTERECTOMY       Current Outpatient Medications  Medication Sig Dispense Refill   acetaminophen (TYLENOL) 325 MG tablet Take 2 tablets (650 mg total) by mouth every 6 (six) hours as needed for mild pain or moderate pain (or Fever >/= 101).     albuterol (PROVENTIL) (2.5 MG/3ML) 0.083% nebulizer solution Take 3 mLs (2.5 mg total) by nebulization every 4 (four) hours as needed for wheezing or shortness of breath. 75 mL 12   ALPRAZolam (XANAX) 0.25 MG tablet Take 1 tablet (0.25 mg total) by mouth 2 (two) times daily as needed for anxiety. See SIG change was taking 1/2 of 0.5 tablet bid prn changed to 0.25 bid prn. D/c 0.5 xanax 60 tablet 2   buPROPion (WELLBUTRIN XL) 150 MG 24 hr tablet Take 1 tablet (150 mg total) by mouth daily. 90 tablet 3   calcium-vitamin D (OSCAL WITH D) 500-200 MG-UNIT tablet Take 1 tablet by mouth 2 (two) times daily with a meal. 60 tablet 0   cholecalciferol (VITAMIN D) 25 MCG tablet Take 1 tablet (1,000 Units total) by mouth 2 (two) times daily with a meal. 30 tablet 2   clobetasol cream (TEMOVATE) 4.09 % Apply 1 application topically 2 (two) times daily as needed (skin irritation).      colchicine 0.6 MG tablet Take 1 tablet (0.6 mg total) by mouth as needed. May repeat 1 pill in 1 hr. No more than 3 total pills 1.8 mg in 24 hours as needed gout flare 30 tablet 2   diclofenac Sodium (VOLTAREN) 1 % GEL Apply 2 g topically 3 (three) times daily. 100 g 2   DULoxetine (CYMBALTA) 60 MG capsule Take 1 capsule (60 mg total) by  mouth daily. 90 capsule 3  ELIQUIS 5 MG TABS tablet TAKE 1 TABLET BY MOUTH TWICE DAILY 180 tablet 1   ergocalciferol (VITAMIN D2) 1.25 MG (50000 UT) capsule Take 50,000 Units by mouth once a week.     estradiol (ESTRACE) 0.1 MG/GM vaginal cream Place 1 Applicatorful vaginally every Monday, Wednesday, and Friday. Apply 0.'5mg'$  (pea-sized amount)  just inside the vaginal introitus with a finger-tip on Monday, Wednesday and Friday nights, 42.5 g 11   Fluticasone-Umeclidin-Vilant (TRELEGY ELLIPTA) 200-62.5-25 MCG/ACT AEPB Inhale 1 puff into the lungs daily. 60 each 0   folic acid (FOLVITE) 1 MG tablet Take 1 tablet (1 mg total) by mouth daily. 90 tablet 3   furosemide (LASIX) 40 MG tablet Take 1 tablet (40 mg total) by mouth as directed. Take 1 tablet daily. May take additional 1 tablet If weight gain of greater than 2 pounds in 24 hours or 5 pounds in one week. 90 tablet 3   GEMTESA 75 MG TABS TAKE 1 TABLET BY MOUTH DAILY 30 tablet 11   glucose blood (ONETOUCH ULTRA) test strip TEST UP TO TWICE DAILY 100 each 1   HYDROcodone-acetaminophen (NORCO/VICODIN) 5-325 MG tablet Take 1 tablet by mouth 2 (two) times daily as needed for severe pain. Must last 30 days 45 tablet 0   [START ON 04/03/2022] HYDROcodone-acetaminophen (NORCO/VICODIN) 5-325 MG tablet Take 1 tablet by mouth 2 (two) times daily as needed for severe pain. Must last 30 days 45 tablet 0   [START ON 05/03/2022] HYDROcodone-acetaminophen (NORCO/VICODIN) 5-325 MG tablet Take 1 tablet by mouth 2 (two) times daily as needed for severe pain. Must last 30 days 45 tablet 0   Lancets (ONETOUCH ULTRASOFT) lancets Use as instructed 100 each 12   levocetirizine (XYZAL) 5 MG tablet TAKE ONE TABLET BY MOUTH AT BEDTIME AS NEEDED FOR ALLERGIES 90 tablet 3   levothyroxine (SYNTHROID) 137 MCG tablet Take 1 tablet (137 mcg total) by mouth daily before breakfast. 30 minutes do not mix with vitamins or acid reflux medications D/c 150 mcg 90 tablet 3   losartan  (COZAAR) 50 MG tablet TAKE 1 TABLET BY MOUTH DAILY 90 tablet 3   metoprolol succinate (TOPROL-XL) 25 MG 24 hr tablet Take 0.5 tablets (12.5 mg total) by mouth daily. 45 tablet 3   Misc. Devices (TUB TRANSFER BOARD) MISC 1 Device by Does not apply route as needed. 1 each 0   montelukast (SINGULAIR) 10 MG tablet Take 1 tablet (10 mg total) by mouth at bedtime. 90 tablet 3   Multiple Vitamin (MULTIVITAMIN WITH MINERALS) TABS tablet Take 1 tablet by mouth daily.     mupirocin ointment (BACTROBAN) 2 % APPLY TOPICALLY TWICE DAILY TO RIGHT LOWER LEG AS DIRECTED. 22 g 0   naloxone (NARCAN) nasal spray 4 mg/0.1 mL Place 1 spray into the nose as needed for up to 365 doses (for opioid-induced respiratory depresssion). In case of emergency (overdose), spray once into each nostril. If no response within 3 minutes, repeat application and call [phone]. 1 each 0   nystatin cream (MYCOSTATIN) Apply 1 application. topically 2 (two) times daily. Angles of mouth or skin folds prn 30 g 11   ondansetron (ZOFRAN) 4 MG tablet Take 1 tablet (4 mg total) by mouth every 8 (eight) hours as needed for nausea or vomiting. 90 tablet 1   pantoprazole (PROTONIX) 40 MG tablet Take 1 tablet (40 mg total) by mouth daily. 30 min before food 90 tablet 3   Powders (GOLD BOND NO MESS BODY POWDER) AERP Apply 1 application.  topically daily as needed. Gold bond talc free powder 200 g 11   pregabalin (LYRICA) 50 MG capsule TAKE 1 CAPSULE BY MOUTH 3 TIMES DAILY 90 capsule 2   rosuvastatin (CRESTOR) 20 MG tablet Take 1 tablet (20 mg total) by mouth daily. 90 tablet 1   Semaglutide, 2 MG/DOSE, (OZEMPIC, 2 MG/DOSE,) 8 MG/3ML SOPN Inject 2 mg into the skin once a week. Start this weekly after week 4. Start after 0.25 weekly x 1 month, then 0.5 weekly x 1 month, then 1 mg weekly x 1 month 3 mL 6   sucralfate (CARAFATE) 1 GM/10ML suspension Take 1 g by mouth 3 (three) times daily.      sulfamethoxazole-trimethoprim (BACTRIM DS) 800-160 MG tablet Take 1  tablet by mouth 2 (two) times daily.     thiamine 100 MG tablet Take 0.5 tablets (50 mg total) by mouth 2 (two) times daily with a meal. 60 tablet 2   No current facility-administered medications for this visit.    Allergies:   Morphine and related, Penicillins, Ambien [zolpidem], Aspirin, and Oxytetracycline    Social History:  The patient  reports that she has never smoked. She has never used smokeless tobacco. She reports that she does not drink alcohol and does not use drugs.   Family History:  The patient's family history includes Alcohol abuse in her father; Arthritis in her maternal grandfather, maternal grandmother, mother, paternal grandfather, and paternal grandmother; Breast cancer in her maternal aunt; Cancer in her father and maternal aunt; Cerebral aneurysm in her father; Diabetes in her father, maternal grandmother, and paternal grandmother; Heart disease in her maternal grandfather; Hypertension in her father, maternal grandfather, maternal grandmother, paternal grandfather, and paternal grandmother; Kidney disease in her mother; Mental illness in her sister; Peripheral vascular disease in her father; Skin cancer in her father; Varicose Veins in her mother.    ROS:  Please see the history of present illness.   Otherwise, review of systems are positive for none.   All other systems are reviewed and negative.    PHYSICAL EXAM: VS:  BP 116/82 (BP Location: Right Arm, Patient Position: Sitting, Cuff Size: Normal)   Pulse 66   Ht 5' 5.5" (1.664 m)   Wt 265 lb 6.4 oz (120.4 kg)   SpO2 91%   BMI 43.49 kg/m  , BMI Body mass index is 43.49 kg/m. GEN: Well nourished, well developed, in no acute distress  HEENT: normal  Neck: no JVD, carotid bruits, or masses Cardiac: Irregularly irregular; no murmurs, rubs, or gallops, trace edema  Respiratory:  clear to auscultation bilaterally, normal work of breathing GI: soft, nontender, nondistended, + BS MS: no deformity or atrophy  Skin:  warm and dry, no rash Neuro:  Strength and sensation are intact Psych: euthymic mood, full affect   EKG:  EKG is ordered today. The ekg ordered today demonstrates atrial fibrillation with ventricular rate of 66 bpm.  No significant ST or T wave changes.  Recent Labs: 06/06/2021: B Natriuretic Peptide 241.7 06/08/2021: Magnesium 1.9 11/27/2021: ALT 15; TSH 0.99 12/23/2021: Hemoglobin 12.2; Platelets 186 02/06/2022: BUN 29; Creatinine, Ser 1.25; Potassium 4.2; Sodium 140    Lipid Panel    Component Value Date/Time   CHOL 88 05/27/2021 1509   CHOL 185 10/10/2014 0822   TRIG 70.0 05/27/2021 1509   HDL 55.50 05/27/2021 1509   HDL 94 10/10/2014 0822   CHOLHDL 2 05/27/2021 1509   VLDL 14.0 05/27/2021 1509   LDLCALC 18 05/27/2021 1509  [place] 73 10/10/2014 0822      Wt Readings from Last 3 Encounters:  03/10/22 265 lb 6.4 oz (120.4 kg)  03/02/22 265 lb (120.2 kg)  02/06/22 265 lb 8 oz (120.4 kg)           No data to display            ASSESSMENT AND PLAN:  1.  Atrial fibrillation: She is likely transitioning into permanent atrial fibrillation.  Ventricular rate is controlled.  She is on long-term anticoagulation with Eliquis.    2.  Chronic diastolic heart failure: Volume overload improved after increasing the dose of Lasix to 40 mg once daily.  She currently appears to be euvolemic.  3. Essential hypertension: Blood pressure is reasonably controlled on current medications.  4.  Possible need of left shoulder surgery: I do not see an absolute contraindication from a cardiac standpoint.  Previous stress testing in the last 3 years was normal and echocardiogram from last year showed no significant abnormalities.  Given very high CHA2DS2-VASc score, she does require bridging with Lovenox if Eliquis is interrupted.  5.  Stage III chronic kidney disease: Stable.    Disposition:   FU with me in 6 months  Signed,  Kathlyn Sacramento, MD  03/10/2022 5:21 PM    [facility]

## 2022-03-11 ENCOUNTER — Telehealth: Payer: Self-pay

## 2022-03-11 IMAGING — US US CAROTID DUPLEX BILAT
1 series · 13 of 24 positions shown · non-contrast
Comparison: None.

CLINICAL DATA: Stroke.  Hypertension, hyperlipidemia, diabetes

EXAM:
BILATERAL CAROTID DUPLEX ULTRASOUND
TECHNIQUE: Gray scale imaging, color Doppler and duplex ultrasound were
performed of bilateral carotid and vertebral arteries in the neck.
Technologist describes technically difficult study secondary to
altered mental status of the patient.

[Series 1: us carotid bilateral · 13 of 116 slices shown]
[im 1/116]
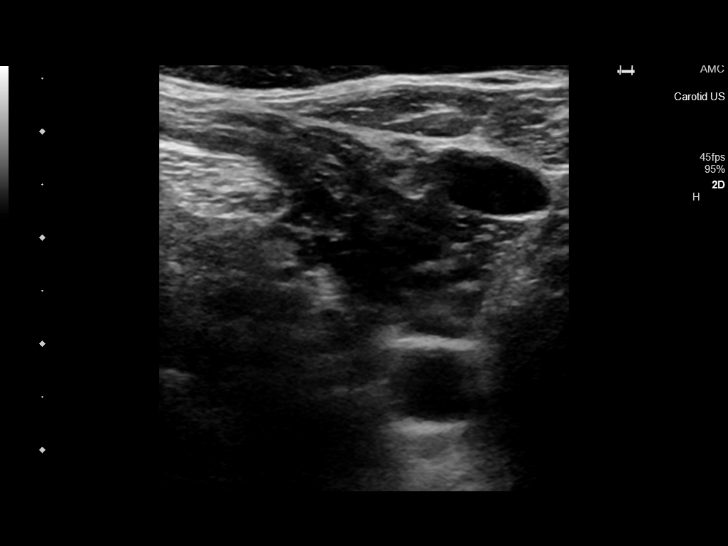
[im 11/116]
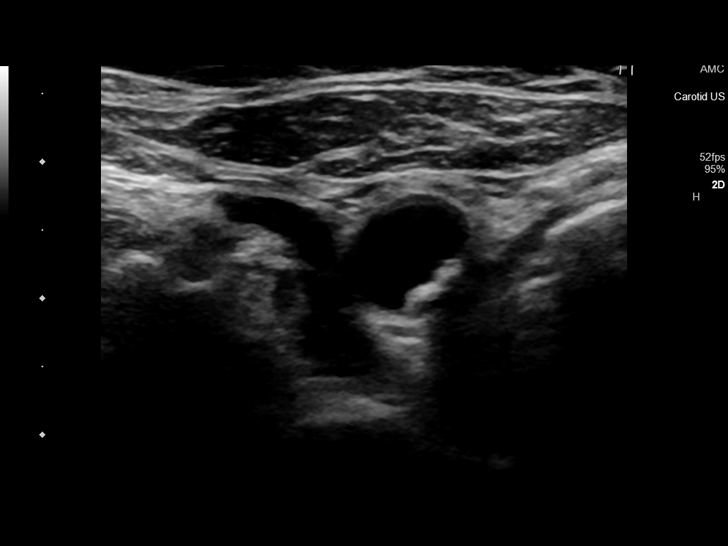
[im 21/116]
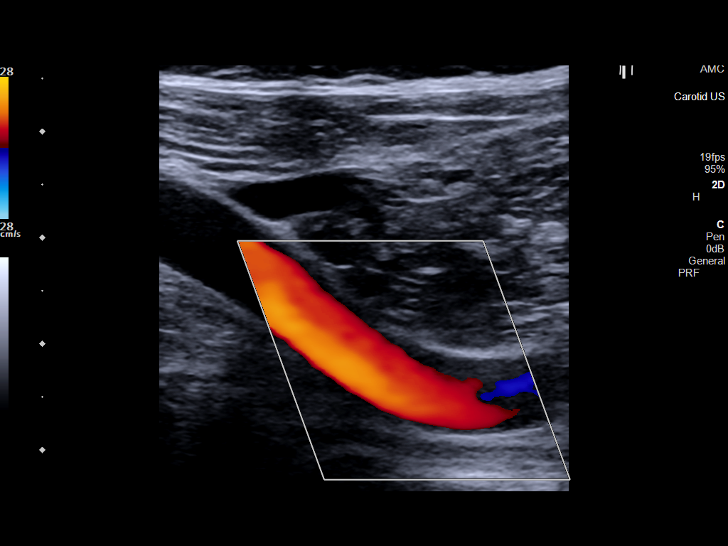
[im 31/116]
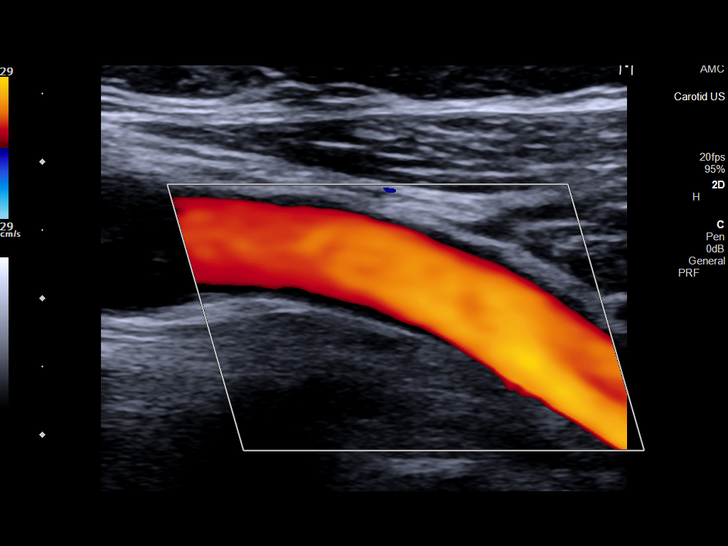
[im 41/116]
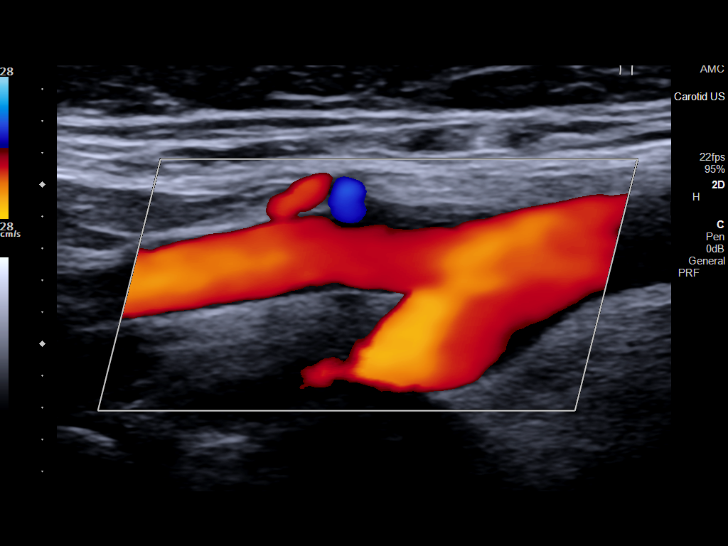
[im 51/116]
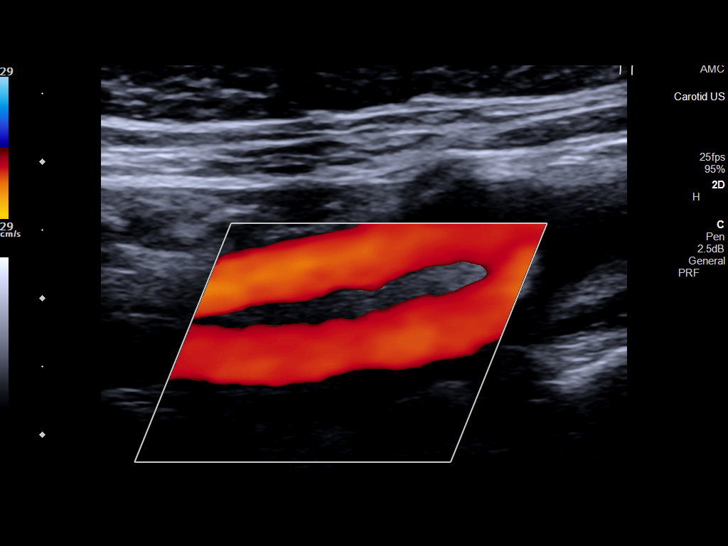
[im 61/116]
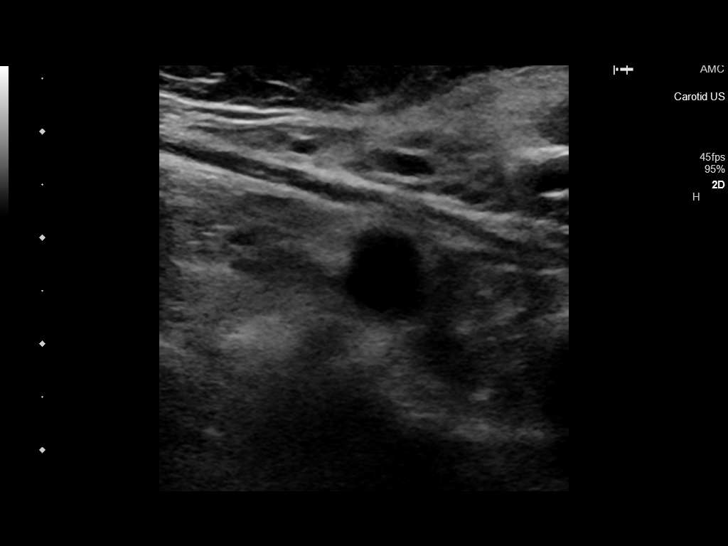
[im 66/116]
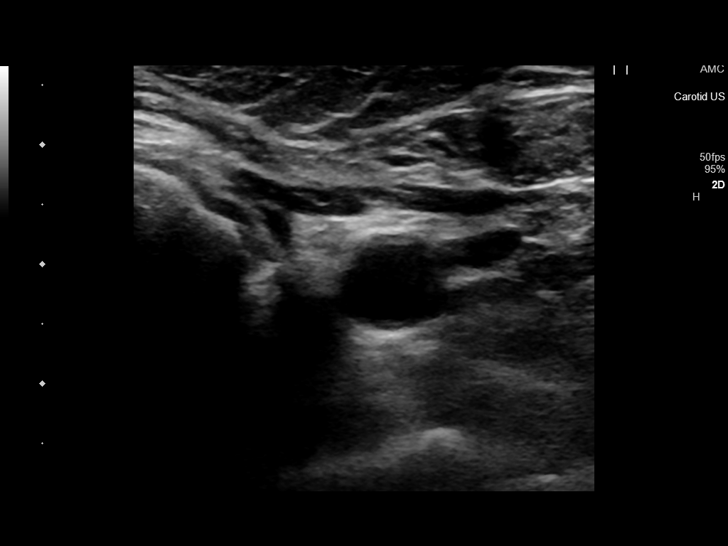
[im 76/116]
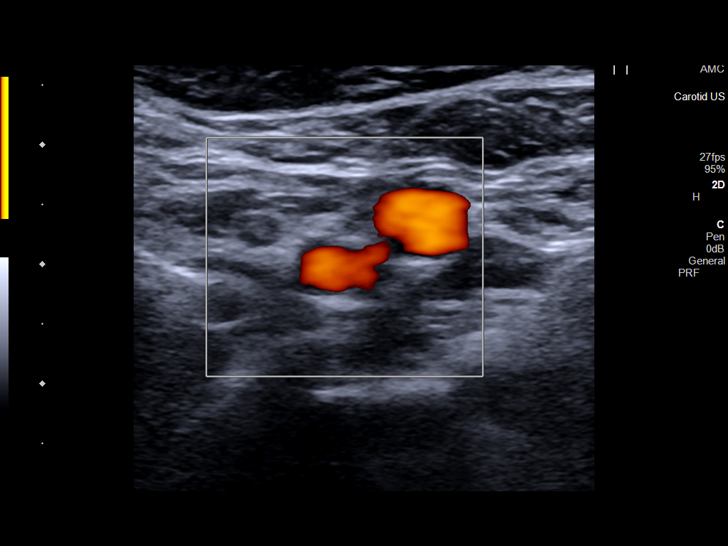
[im 86/116]
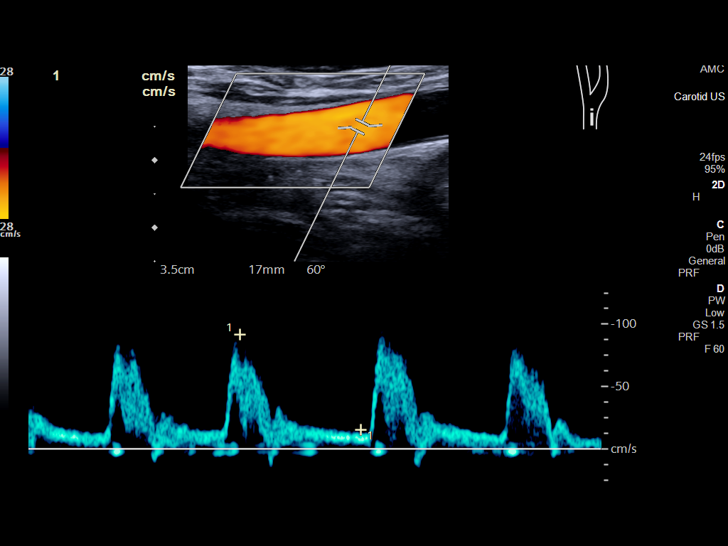
[im 96/116]
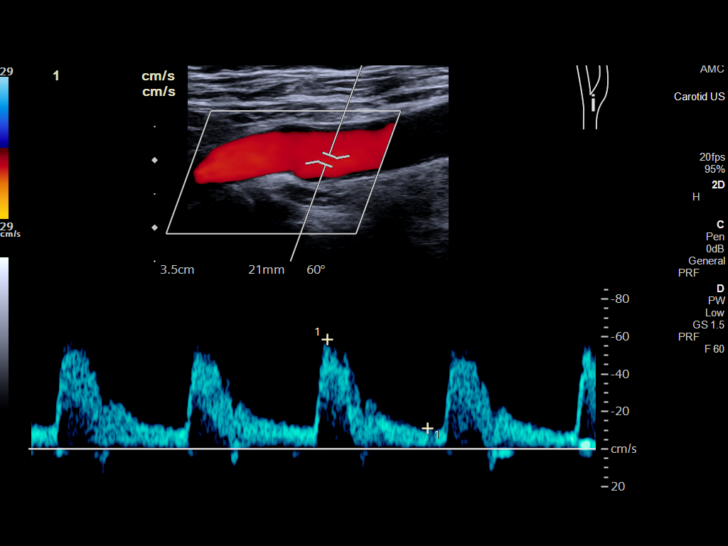
[im 106/116]
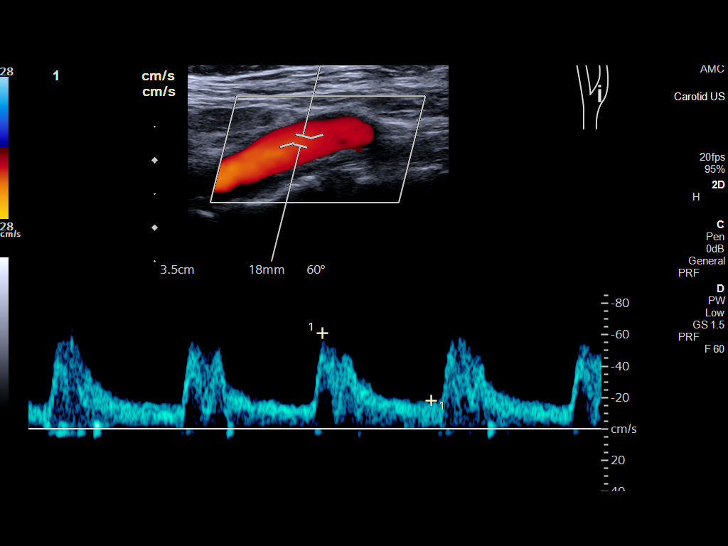
[im 116/116]
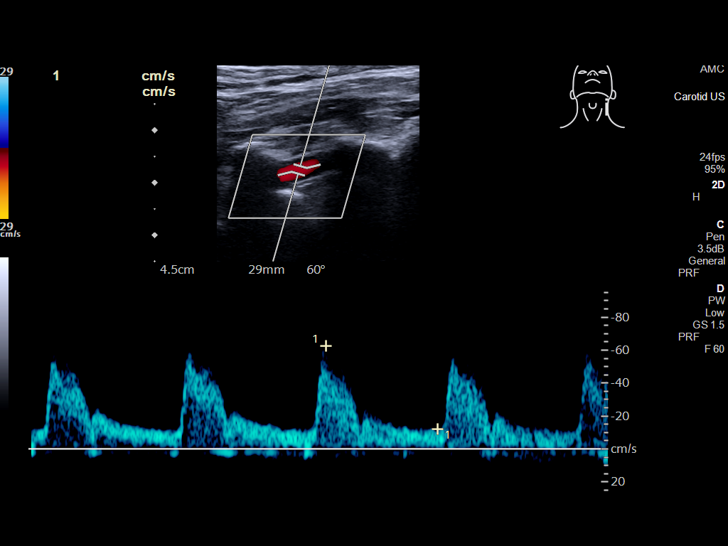

[13 of 24 positions shown; findings below may reference images not displayed]

FINDINGS: Criteria: Quantification of carotid stenosis is based on velocity
parameters that correlate the residual internal carotid diameter
with NASCET-based stenosis levels, using the diameter of the distal
internal carotid lumen as the denominator for stenosis measurement.

The following velocity measurements were obtained:

RIGHT

ICA: 85/19 cm/sec

CCA: 69/14 cm/sec

SYSTOLIC ICA/CCA RATIO:

ECA: 109 cm/sec

LEFT

ICA: 84/22 cm/sec

CCA: 74/15 cm/sec

SYSTOLIC ICA/CCA RATIO:

ECA: 98 cm/sec

RIGHT CAROTID ARTERY: Eccentric calcified plaque in the bulb with
only mild stenosis. Mild tortuosity. Normal waveforms and color
Doppler signal throughout.

RIGHT VERTEBRAL ARTERY:  Normal flow direction and waveform.

LEFT CAROTID ARTERY: Minimal plaque in the bulb. No significant
stenosis. Normal waveforms and color Doppler signal throughout.

LEFT VERTEBRAL ARTERY:  Normal flow direction and waveform.
IMPRESSION: 1. Bilateral carotid bifurcation plaque resulting in less than 50%
diameter ICA stenosis.
2. Antegrade bilateral vertebral arterial flow.

## 2022-03-11 NOTE — Telephone Encounter (Signed)
Received request for Pregabalin 50 mg qty:90 Last written 12/01/21 last filled 02/07/22   Next visit with you: 03/18/23

## 2022-03-17 ENCOUNTER — Encounter: Payer: Medicare HMO | Admitting: Family Medicine

## 2022-03-24 ENCOUNTER — Telehealth: Payer: Self-pay | Admitting: Family Medicine

## 2022-03-24 DIAGNOSIS — E1142 Type 2 diabetes mellitus with diabetic polyneuropathy: Secondary | ICD-10-CM

## 2022-03-24 DIAGNOSIS — M797 Fibromyalgia: Secondary | ICD-10-CM

## 2022-03-24 NOTE — Telephone Encounter (Signed)
Pt need a refill on pregabalin [facility]

## 2022-03-25 NOTE — Telephone Encounter (Signed)
Pt is waiting on her medication to be sent to the pharmacy

## 2022-03-25 NOTE — Telephone Encounter (Signed)
Pt sees provider on tomorrow and the provider will not write the script until she is seen pt was advised by front office staff that she has to be seen first.

## 2022-03-26 ENCOUNTER — Inpatient Hospital Stay: Payer: Medicare HMO

## 2022-03-26 ENCOUNTER — Encounter: Payer: Self-pay | Admitting: Nurse Practitioner

## 2022-03-26 ENCOUNTER — Ambulatory Visit (INDEPENDENT_AMBULATORY_CARE_PROVIDER_SITE_OTHER): Payer: Medicare HMO | Admitting: Nurse Practitioner

## 2022-03-26 VITALS — BP 138/82 | HR 73 | Temp 98.5°F | Ht 65.5 in | Wt 284.6 lb

## 2022-03-26 DIAGNOSIS — J40 Bronchitis, not specified as acute or chronic: Secondary | ICD-10-CM | POA: Diagnosis not present

## 2022-03-26 DIAGNOSIS — N952 Postmenopausal atrophic vaginitis: Secondary | ICD-10-CM

## 2022-03-26 DIAGNOSIS — M797 Fibromyalgia: Secondary | ICD-10-CM | POA: Diagnosis not present

## 2022-03-26 DIAGNOSIS — R6889 Other general symptoms and signs: Secondary | ICD-10-CM

## 2022-03-26 DIAGNOSIS — E1142 Type 2 diabetes mellitus with diabetic polyneuropathy: Secondary | ICD-10-CM

## 2022-03-26 DIAGNOSIS — Z1152 Encounter for screening for COVID-19: Secondary | ICD-10-CM

## 2022-03-26 DIAGNOSIS — J309 Allergic rhinitis, unspecified: Secondary | ICD-10-CM

## 2022-03-26 LAB — POCT INFLUENZA A/B
Influenza A, POC: NEGATIVE
Influenza B, POC: NEGATIVE

## 2022-03-26 LAB — POC COVID19 BINAXNOW: SARS Coronavirus 2 Ag: NEGATIVE

## 2022-03-26 MED ORDER — PREGABALIN 50 MG PO CAPS: ORAL_CAPSULE | ORAL | 2 refills | Status: DC

## 2022-03-26 MED ORDER — ESTRADIOL 0.1 MG/GM VA CREA: 1.0000 | TOPICAL_CREAM | VAGINAL | 11 refills | Status: DC

## 2022-03-26 MED ORDER — LEVOCETIRIZINE DIHYDROCHLORIDE 5 MG PO TABS: ORAL_TABLET | ORAL | 3 refills | Status: DC

## 2022-03-26 MED ORDER — ESTRADIOL 0.1 MG/GM VA CREA: TOPICAL_CREAM | VAGINAL | 11 refills | Status: DC

## 2022-03-26 MED ORDER — DOXYCYCLINE HYCLATE 100 MG PO TABS: 100.0000 mg | ORAL_TABLET | Freq: Two times a day (BID) | ORAL | 0 refills | Status: DC

## 2022-03-26 NOTE — Assessment & Plan Note (Addendum)
COVID and Influenza testing in office- Negative. Will treat with Doxycycline 100 BID x 7 days. Patient states she has plenty of nebulizer medication at home as well as Benzonatate for her cough. Encouraged her to seek care if worsening shortness of breath or difficulty breathing occurs.

## 2022-03-26 NOTE — Assessment & Plan Note (Signed)
Chronic. Stable on Lyrica '50mg'$  TID. Continue.

## 2022-03-26 NOTE — Progress Notes (Signed)
Tomasita Morrow, NP-C Phone: [phone]  [name] is a 72 y.o. adult who presents today for cough and chest congestion x 6 days.  Respiratory illness:  Cough- Yes, productive- green mucus  Congestion-    Sinus- No   Chest- Yes  Post nasal drip- Yes  Sore throat- Yes  Shortness of breath- Yes, did breathing treatments yesterday that helped  Fever- Low grade  Fatigue/Myalgia- Yes Headache- Yes Nausea/Vomiting- No Taste disturbance- No  Smell disturbance- No  Covid vaccination- x 2  Flu vaccination- UTD  Medications- Cough drops  She is also requesting refills on her medications.    Social History   Tobacco Use  Smoking Status Never  Smokeless Tobacco Never    Current Outpatient Medications on File Prior to Visit  Medication Sig Dispense Refill   acetaminophen (TYLENOL) 325 MG tablet Take 2 tablets (650 mg total) by mouth every 6 (six) hours as needed for mild pain or moderate pain (or Fever >/= 101).     albuterol (PROVENTIL) (2.5 MG/3ML) 0.083% nebulizer solution Take 3 mLs (2.5 mg total) by nebulization every 4 (four) hours as needed for wheezing or shortness of breath. 75 mL 12   ALPRAZolam (XANAX) 0.25 MG tablet Take 1 tablet (0.25 mg total) by mouth 2 (two) times daily as needed for anxiety. See SIG change was taking 1/2 of 0.5 tablet bid prn changed to 0.25 bid prn. D/c 0.5 xanax 60 tablet 2   buPROPion (WELLBUTRIN XL) 150 MG 24 hr tablet Take 1 tablet (150 mg total) by mouth daily. 90 tablet 3   calcium-vitamin D (OSCAL WITH D) 500-200 MG-UNIT tablet Take 1 tablet by mouth 2 (two) times daily with a meal. 60 tablet 0   cholecalciferol (VITAMIN D) 25 MCG tablet Take 1 tablet (1,000 Units total) by mouth 2 (two) times daily with a meal. 30 tablet 2   clobetasol cream (TEMOVATE) 9.24 % Apply 1 application topically 2 (two) times daily as needed (skin irritation).      colchicine 0.6 MG tablet Take 1 tablet (0.6 mg total) by mouth as needed. May repeat 1 pill in 1 hr.  No more than 3 total pills 1.8 mg in 24 hours as needed gout flare 30 tablet 2   diclofenac Sodium (VOLTAREN) 1 % GEL Apply 2 g topically 3 (three) times daily. 100 g 2   DULoxetine (CYMBALTA) 60 MG capsule Take 1 capsule (60 mg total) by mouth daily. 90 capsule 3   ELIQUIS 5 MG TABS tablet TAKE 1 TABLET BY MOUTH TWICE DAILY 180 tablet 1   ergocalciferol (VITAMIN D2) 1.25 MG (50000 UT) capsule Take 50,000 Units by mouth once a week.     Fluticasone-Umeclidin-Vilant (TRELEGY ELLIPTA) 200-62.5-25 MCG/ACT AEPB Inhale 1 puff into the lungs daily. 60 each 0   folic acid (FOLVITE) 1 MG tablet Take 1 tablet (1 mg total) by mouth daily. 90 tablet 3   furosemide (LASIX) 40 MG tablet Take 1 tablet (40 mg total) by mouth as directed. Take 1 tablet daily. May take additional 1 tablet If weight gain of greater than 2 pounds in 24 hours or 5 pounds in one week. 90 tablet 3   GEMTESA 75 MG TABS TAKE 1 TABLET BY MOUTH DAILY 30 tablet 11   glucose blood (ONETOUCH ULTRA) test strip TEST UP TO TWICE DAILY 100 each 1   HYDROcodone-acetaminophen (NORCO/VICODIN) 5-325 MG tablet Take 1 tablet by mouth 2 (two) times daily as needed for severe pain. Must last 30 days  45 tablet 0   [START ON 04/03/2022] HYDROcodone-acetaminophen (NORCO/VICODIN) 5-325 MG tablet Take 1 tablet by mouth 2 (two) times daily as needed for severe pain. Must last 30 days 45 tablet 0   [START ON 05/03/2022] HYDROcodone-acetaminophen (NORCO/VICODIN) 5-325 MG tablet Take 1 tablet by mouth 2 (two) times daily as needed for severe pain. Must last 30 days 45 tablet 0   Lancets (ONETOUCH ULTRASOFT) lancets Use as instructed 100 each 12   levothyroxine (SYNTHROID) 137 MCG tablet Take 1 tablet (137 mcg total) by mouth daily before breakfast. 30 minutes do not mix with vitamins or acid reflux medications D/c 150 mcg 90 tablet 3   losartan (COZAAR) 50 MG tablet TAKE 1 TABLET BY MOUTH DAILY 90 tablet 3   metoprolol succinate (TOPROL-XL) 25 MG 24 hr tablet Take 0.5  tablets (12.5 mg total) by mouth daily. 45 tablet 3   Misc. Devices (TUB TRANSFER BOARD) MISC 1 Device by Does not apply route as needed. 1 each 0   montelukast (SINGULAIR) 10 MG tablet Take 1 tablet (10 mg total) by mouth at bedtime. 90 tablet 3   Multiple Vitamin (MULTIVITAMIN WITH MINERALS) TABS tablet Take 1 tablet by mouth daily.     mupirocin ointment (BACTROBAN) 2 % APPLY TOPICALLY TWICE DAILY TO RIGHT LOWER LEG AS DIRECTED. 22 g 0   naloxone (NARCAN) nasal spray 4 mg/0.1 mL Place 1 spray into the nose as needed for up to 365 doses (for opioid-induced respiratory depresssion). In case of emergency (overdose), spray once into each nostril. If no response within 3 minutes, repeat application and call [phone]. 1 each 0   nystatin cream (MYCOSTATIN) Apply 1 application. topically 2 (two) times daily. Angles of mouth or skin folds prn 30 g 11   ondansetron (ZOFRAN) 4 MG tablet Take 1 tablet (4 mg total) by mouth every 8 (eight) hours as needed for nausea or vomiting. 90 tablet 1   pantoprazole (PROTONIX) 40 MG tablet Take 1 tablet (40 mg total) by mouth daily. 30 min before food 90 tablet 3   Powders (GOLD BOND NO MESS BODY POWDER) AERP Apply 1 application. topically daily as needed. Gold bond talc free powder 200 g 11   rosuvastatin (CRESTOR) 20 MG tablet Take 1 tablet (20 mg total) by mouth daily. 90 tablet 1   Semaglutide, 2 MG/DOSE, (OZEMPIC, 2 MG/DOSE,) 8 MG/3ML SOPN Inject 2 mg into the skin once a week. Start this weekly after week 4. Start after 0.25 weekly x 1 month, then 0.5 weekly x 1 month, then 1 mg weekly x 1 month 3 mL 6   sucralfate (CARAFATE) 1 GM/10ML suspension Take 1 g by mouth 3 (three) times daily.      thiamine 100 MG tablet Take 0.5 tablets (50 mg total) by mouth 2 (two) times daily with a meal. 60 tablet 2   No current facility-administered medications on file prior to visit.     ROS see history of present illness  Objective  Physical Exam Vitals:   03/26/22 1422  BP:  138/82  Pulse: 73  Temp: 98.5 F (36.9 C)  SpO2: 94%    BP Readings from Last 3 Encounters:  03/26/22 138/82  03/10/22 116/82  03/02/22 117/64   Wt Readings from Last 3 Encounters:  03/26/22 284 lb 9.6 oz (129.1 kg)  03/10/22 265 lb 6.4 oz (120.4 kg)  03/02/22 265 lb (120.2 kg)    Physical Exam Constitutional:      General: She is not in acute  distress.    Appearance: Normal appearance.  HENT:     Head: Normocephalic.     Right Ear: Tympanic membrane normal.     Left Ear: Tympanic membrane normal.     Nose: Nose normal.     Mouth/Throat:     Mouth: Mucous membranes are moist.     Pharynx: Oropharynx is clear.  Eyes:     Pupils: Pupils are equal, round, and reactive to light.  Cardiovascular:     Rate and Rhythm: Rhythm irregular.     Heart sounds: No murmur heard.    No friction rub. No gallop.     Comments: Permanent A-fib Pulmonary:     Effort: Pulmonary effort is normal. No respiratory distress.     Breath sounds: Wheezing (noted in bilateral upper lobes) present.  Abdominal:     General: Abdomen is flat. Bowel sounds are normal.     Palpations: Abdomen is soft.  Skin:    General: Skin is warm.  Neurological:     Mental Status: She is alert.  Psychiatric:        Mood and Affect: Mood normal.        Behavior: Behavior normal.    Assessment/Plan: Please see individual problem list.  Bronchitis Assessment & Plan: COVID and Influenza testing in office- Negative. Will treat with Doxycycline 100 BID x 7 days. Patient states she has plenty of nebulizer medication at home as well as Benzonatate for her cough. Encouraged her to seek care if worsening shortness of breath or difficulty breathing occurs.  Orders: -     Doxycycline Hyclate; Take 1 tablet (100 mg total) by mouth 2 (two) times daily.  Dispense: 14 tablet; Refill: 0  Diabetic peripheral neuropathy associated with type 2 diabetes mellitus ([place]) Assessment & Plan: Chronic. Stable on Lyrica '50mg'$  TID.  Continue.   Orders: -     Pregabalin; TAKE 1 CAPSULE BY MOUTH 3 TIMES DAILY  Dispense: 90 capsule; Refill: 2  Fibromyalgia syndrome -     Pregabalin; TAKE 1 CAPSULE BY MOUTH 3 TIMES DAILY  Dispense: 90 capsule; Refill: 2  Atrophic vaginitis -     Estradiol; Apply 0.'5mg'$  (pea-sized amount)  just inside the vaginal introitus with a finger-tip on Monday, Wednesday and Friday nights,  Dispense: 42.5 g; Refill: 11  Allergic rhinitis, unspecified seasonality, unspecified trigger -     Levocetirizine Dihydrochloride; TAKE ONE TABLET BY MOUTH AT BEDTIME AS NEEDED FOR ALLERGIES  Dispense: 90 tablet; Refill: 3  Encounter for screening for COVID-19 -     POC COVID-19 BinaxNow  Flu-like symptoms -     POCT Influenza A/B   Return in about 3 months (around 06/25/2022), or if symptoms worsen or fail to improve.   Tomasita Morrow, NP-C [place]

## 2022-04-01 ENCOUNTER — Other Ambulatory Visit: Payer: Self-pay | Admitting: *Deleted

## 2022-04-01 DIAGNOSIS — D509 Iron deficiency anemia, unspecified: Secondary | ICD-10-CM

## 2022-04-02 ENCOUNTER — Inpatient Hospital Stay: Payer: Medicare HMO | Attending: Oncology

## 2022-04-05 DIAGNOSIS — I10 Essential (primary) hypertension: Secondary | ICD-10-CM | POA: Diagnosis not present

## 2022-04-05 DIAGNOSIS — G4700 Insomnia, unspecified: Secondary | ICD-10-CM | POA: Diagnosis not present

## 2022-04-05 DIAGNOSIS — G4733 Obstructive sleep apnea (adult) (pediatric): Secondary | ICD-10-CM | POA: Diagnosis not present

## 2022-04-05 DIAGNOSIS — Z8673 Personal history of transient ischemic attack (TIA), and cerebral infarction without residual deficits: Secondary | ICD-10-CM | POA: Diagnosis not present

## 2022-04-10 ENCOUNTER — Encounter: Payer: Medicare HMO | Admitting: Nurse Practitioner

## 2022-04-13 ENCOUNTER — Encounter: Payer: Medicare HMO | Admitting: Family Medicine

## 2022-04-16 ENCOUNTER — Other Ambulatory Visit: Payer: Self-pay | Admitting: Nurse Practitioner

## 2022-04-16 DIAGNOSIS — I5033 Acute on chronic diastolic (congestive) heart failure: Secondary | ICD-10-CM

## 2022-04-24 IMAGING — MR MR HEAD W/O CM
11 series · 40 of 48 positions shown · non-contrast
Comparison: Noncontrast head CT 02/04/2020. MRI/MRA head
12/22/2019.

CLINICAL DATA: Neuro deficit, acute, stroke suspected.

EXAM:
MRI HEAD WITHOUT CONTRAST
TECHNIQUE: Multiplanar, multiecho pulse sequences of the brain and surrounding
structures were obtained without intravenous contrast.

[Series 9: ax dwi_tracew · axial · 3.0mm · 0.60mm/px · z∈[-136,+17]mm · 4 of 48 slices shown]
[im 1/48]
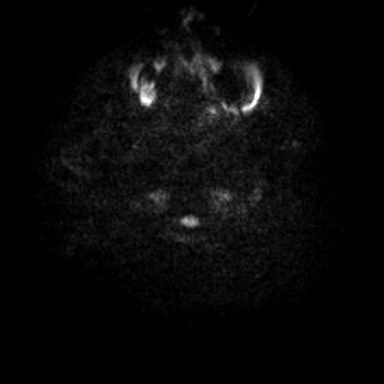
[im 16/48]
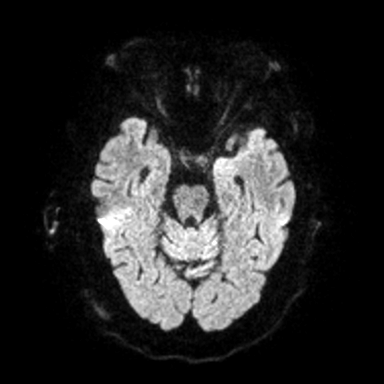
[im 32/48]
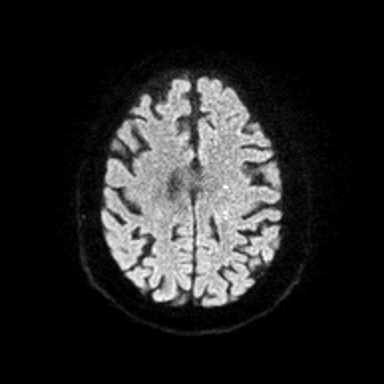
[im 48/48]
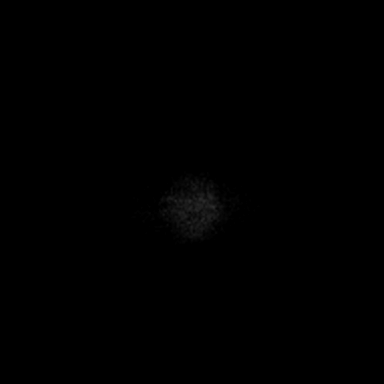

[Series 10: ax dwi_adc · axial · 3.0mm · 0.60mm/px · z∈[-136,+17]mm · 4 of 48 slices shown]
[im 1/48]
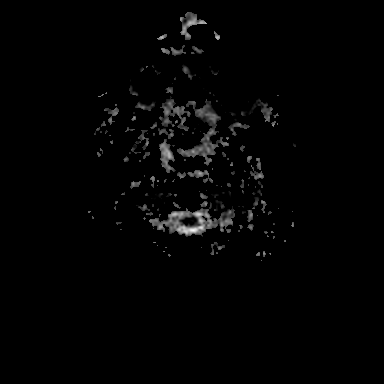
[im 16/48]
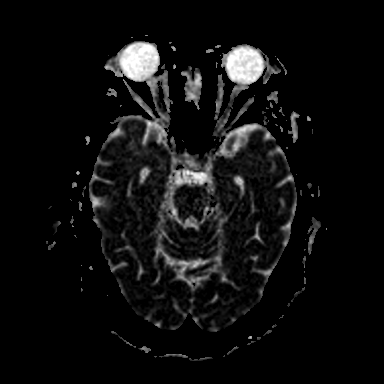
[im 32/48]
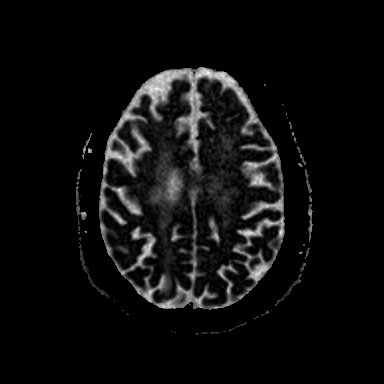
[im 48/48]
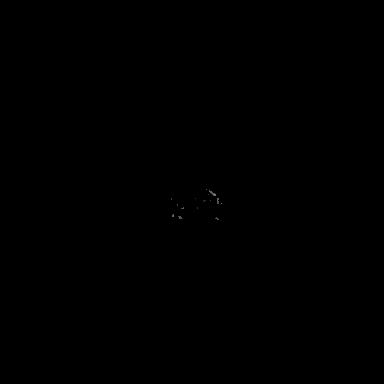

[Series 11: cor dwi_tracew · coronal · 5.0mm · 0.60mm/px · 3 of 40 slices shown]
[im 1/40]
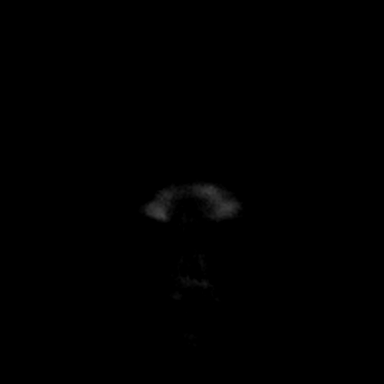
[im 20/40]
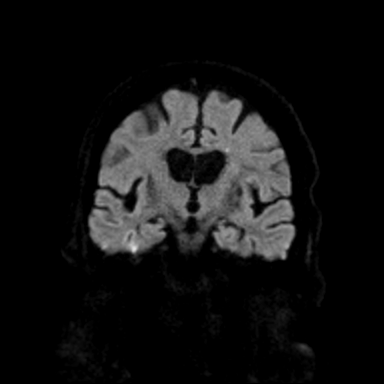
[im 40/40]
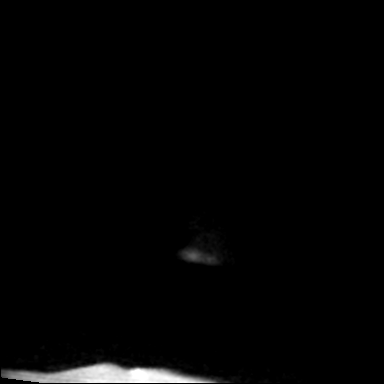

[Series 12: cor dwi_adc · coronal · 5.0mm · 0.60mm/px · 3 of 40 slices shown]
[im 1/40]
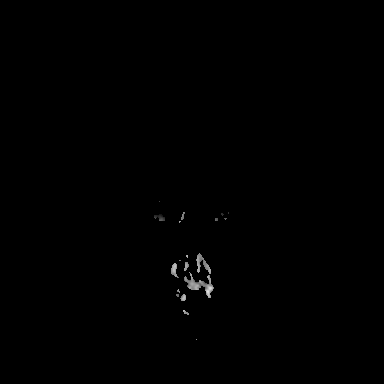
[im 20/40]
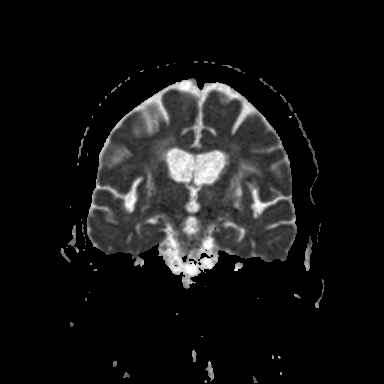
[im 40/40]
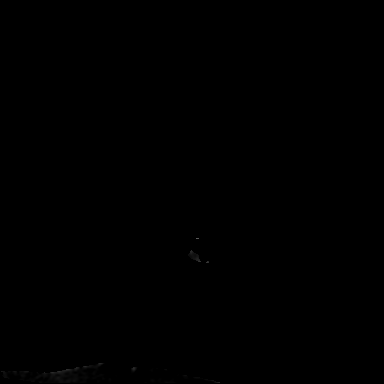

[Series 13: T1 · sagittal · 5.0mm · 0.62mm/px · 2 of 25 slices shown (1 of 2)]
[im 1/25]
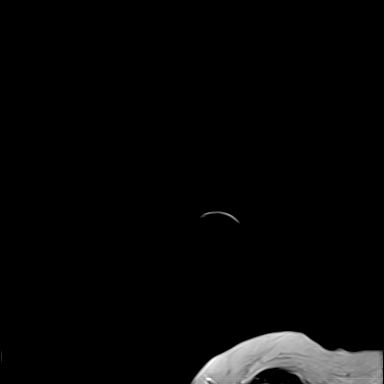
[im 25/25]
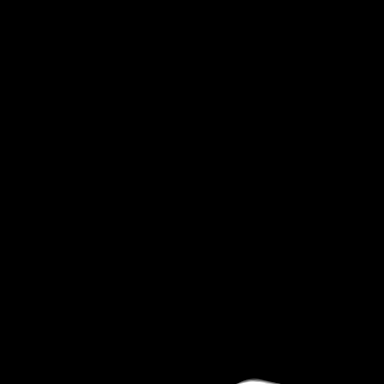

[Series 14: T2 · axial · 5.0mm · 0.53mm/px · z∈[-130,+12]mm · 2 of 25 slices shown (1 of 2)]
[im 1/25]
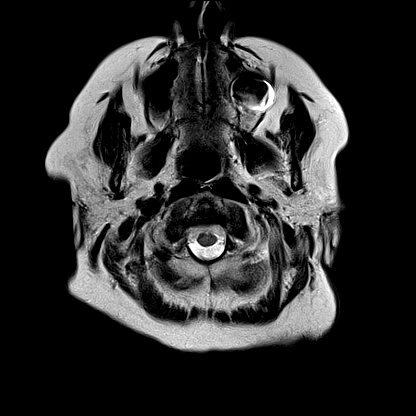
[im 25/25]
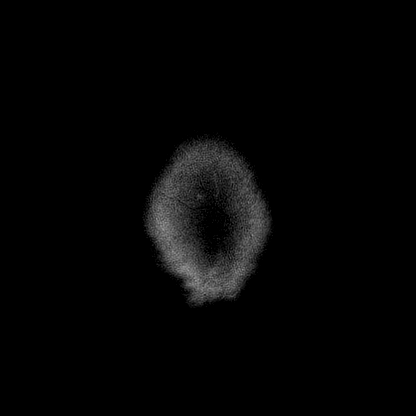

[Series 15: FLAIR · axial · 3.0mm · 0.53mm/px · z∈[-139,+21]mm · 4 of 55 slices shown]
[im 1/55]
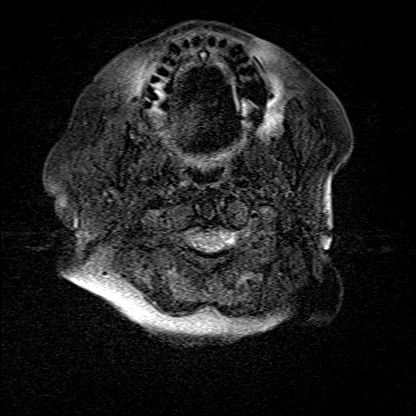
[im 19/55]
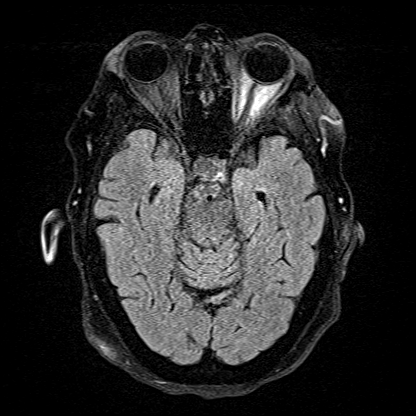
[im 37/55]
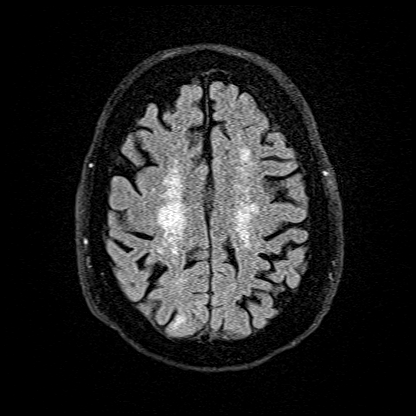
[im 55/55]
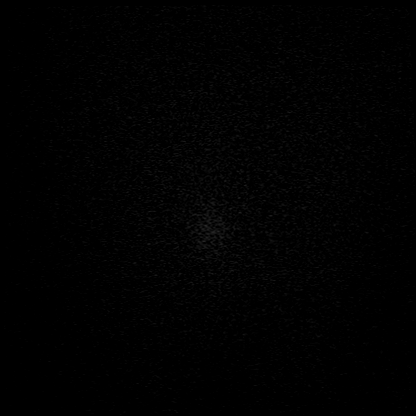

[Series 16: T1 · axial · 1.0mm · 0.98mm/px · z∈[-146,+26]mm · 8 of 176 slices shown (2 of 2)]
[im 1/176]
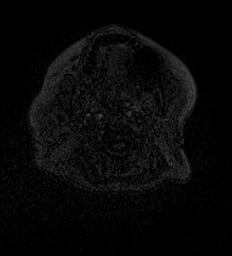
[im 27/176]
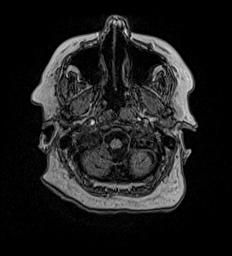
[im 54/176]
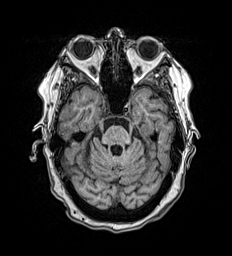
[im 81/176]
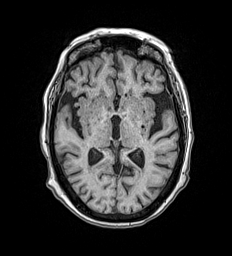
[im 95/176]
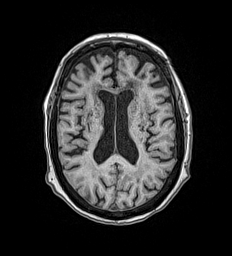
[im 122/176]
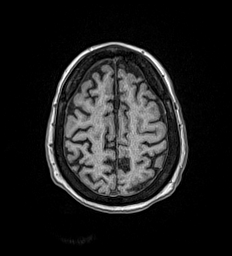
[im 149/176]
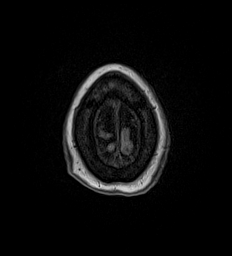
[im 176/176]
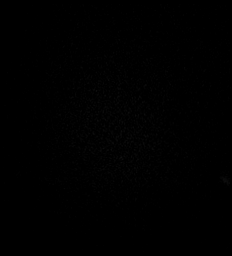

[Series 18: pha_images · axial · 3.0mm · 0.90mm/px · z∈[-145,+23]mm · 5 of 57 slices shown]
[im 1/57]
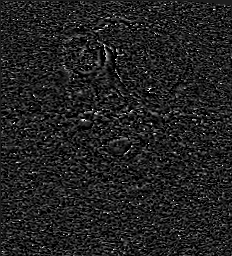
[im 15/57]
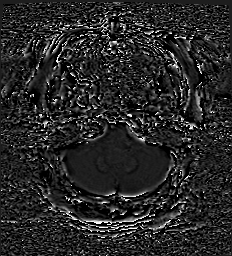
[im 29/57]
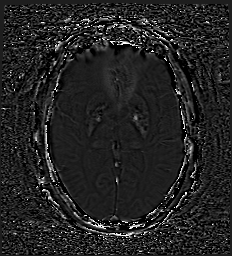
[im 43/57]
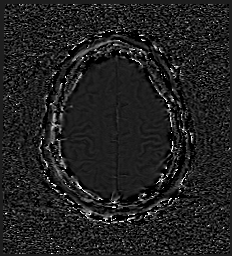
[im 57/57]
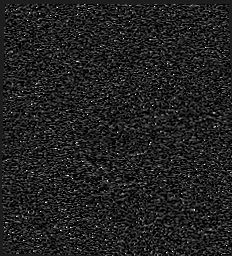

[Series 19: swi_images · axial · 3.0mm · 0.90mm/px · z∈[-145,-59]mm · 3 of 60 slices shown]
[im 1/60]
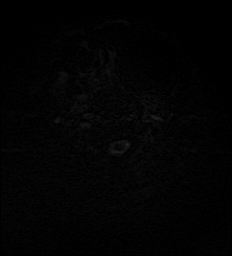
[im 15/60]
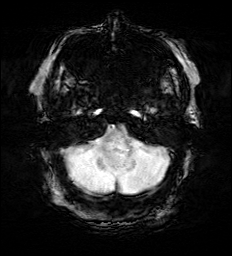
[im 30/60]
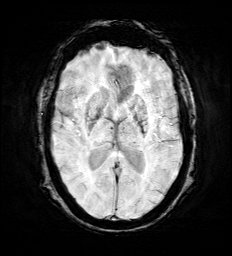

[Series 21: T2 · coronal · 5.0mm · 0.57mm/px · 2 of 29 slices shown (2 of 2)]
[im 1/29]
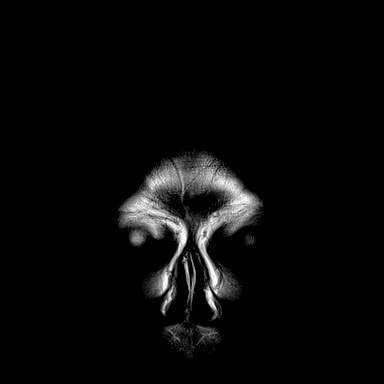
[im 29/29]
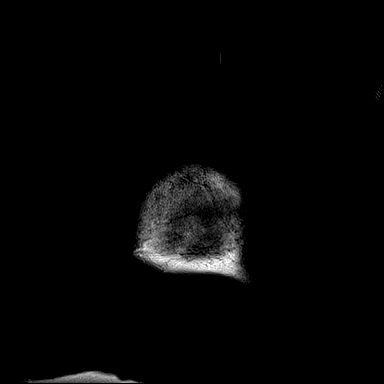

[40 of 48 positions shown; findings below may reference images not displayed]

FINDINGS: Brain:

Mild-to-moderate generalized cerebral atrophy.

New from the prior MRI of 12/22/2019, there is a 3 mm cortical focus
of restricted diffusion within the posterior left frontal lobe
consistent with acute infarction (series 9, image 34). Also new from
this prior exam, there is a 2 mm acute infarct within the left
frontal lobe periventricular white matter (series 9, image 32).

Unchanged small scattered chronic cortical infarcts, most notably
within the right parietal lobe (series 15, image 35).

Background severe scattered and confluent T2/FLAIR hyperintensity
within the cerebral white matter which is nonspecific, but
compatible with chronic small vessel ischemic disease. This includes
multiple chronic cerebral white matter small vessel infarcts. A
chronic small-vessel infarct within the left centrum semiovale is
new as compared to the prior MRI (series 9, image 33).

Prominent perivascular spaces within the bilateral basal ganglia
with likely superimposed chronic lacunar infarcts, unchanged.
Redemonstrated chronic lacunar infarcts within the bilateral
thalami.

Comparatively mild chronic small vessel ischemic changes within the
pons.

There are few nonspecific scattered supratentorial chronic
microhemorrhages, unchanged.

No evidence of intracranial mass.

No extra-axial fluid collection.

No midline shift.

Vascular: Expected proximal arterial flow voids.

Skull and upper cervical spine: No focal marrow lesion.

Sinuses/Orbits: Visualized orbits show no acute finding. Minimal
left maxillary sinus mucosal thickening at the imaged levels. Trace
left mastoid effusion.
IMPRESSION: 3 mm acute cortical infarct within the posterior left frontal lobe.

2 mm acute infarct within the left frontal lobe periventricular
white matter.

Chronic small-vessel infarct within the left centrum semiovale, new
as compared to MRI of 12/22/2019.

Cerebral atrophy and severe chronic small vessel ischemic disease
with multiple chronic infarcts, as detailed and otherwise unchanged.

## 2022-04-24 IMAGING — DX DG CHEST 1V PORT
1 series · 1 of 1 positions shown · non-contrast
Comparison: Radiograph 12/19/2019

CLINICAL DATA: Left-sided arm weakness and bilateral lower
extremity weakness since [REDACTED]

EXAM:
PORTABLE CHEST 1 VIEW

[chest ap]
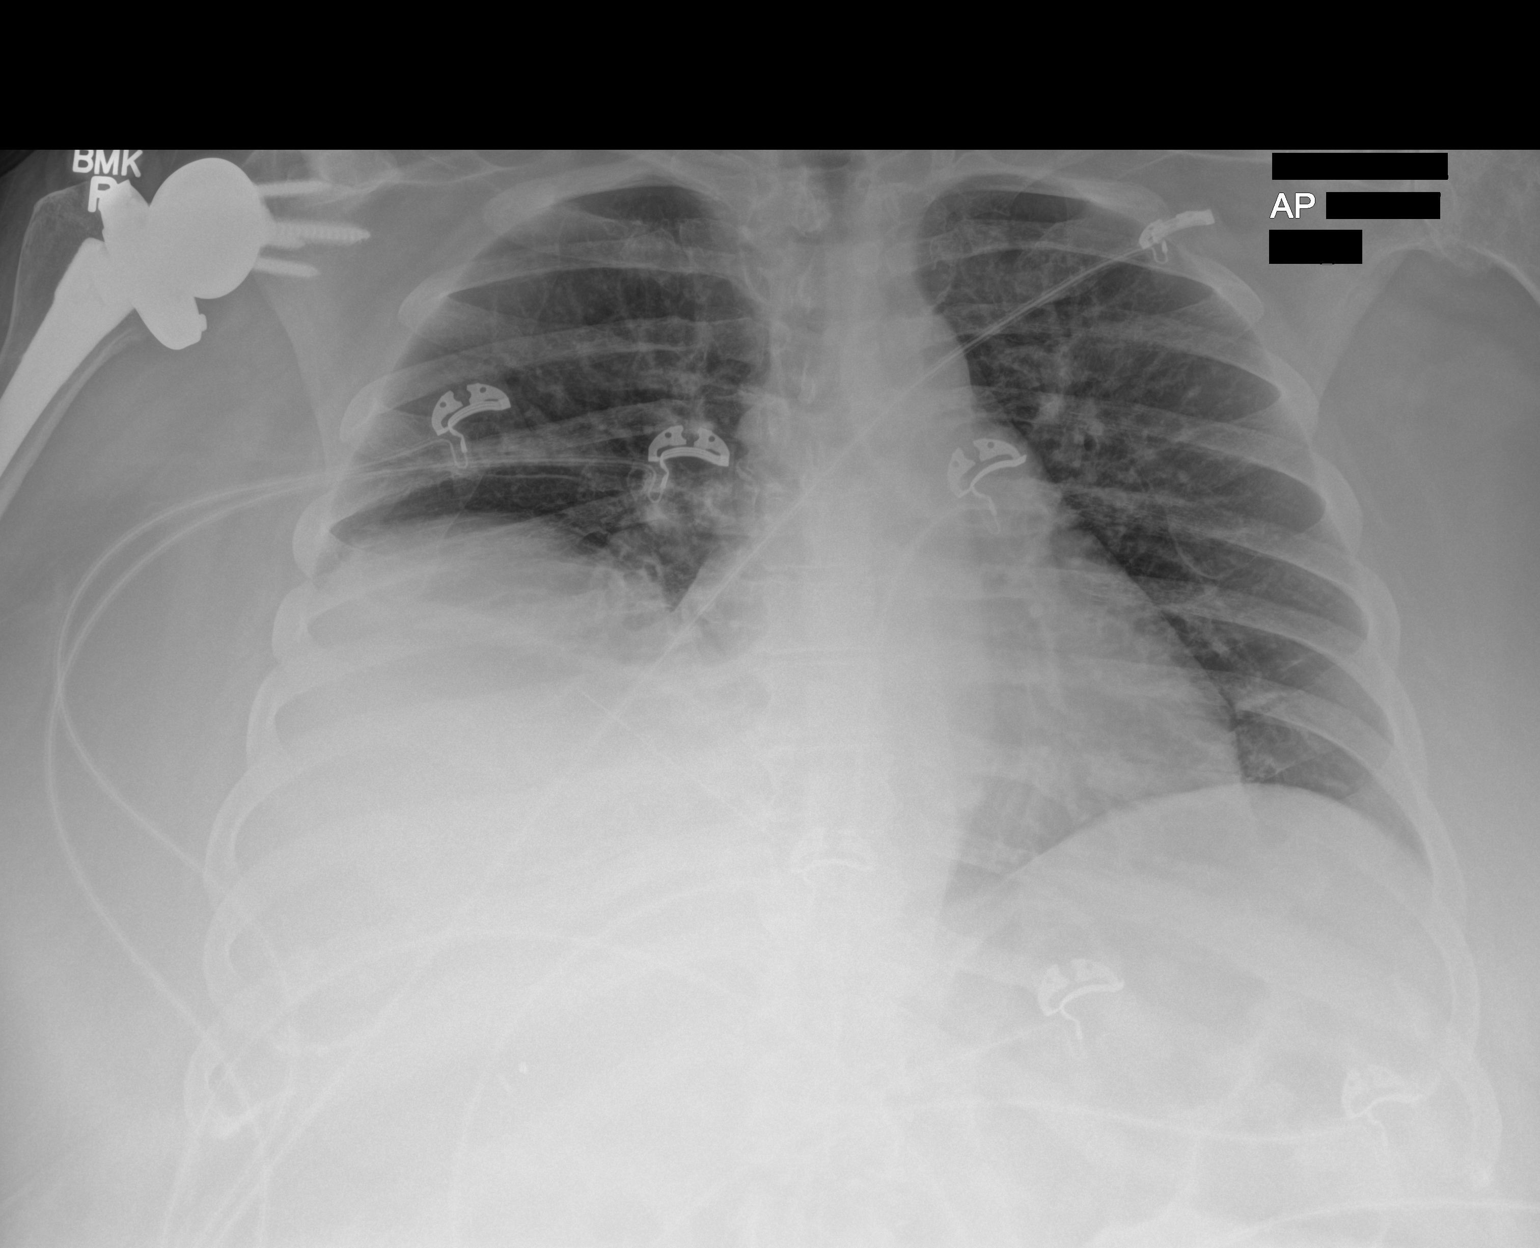

[1 of 1 positions shown; findings below may reference images not displayed]

FINDINGS: Chronic elevation of the right hemidiaphragm with some adjacent
opacities, likely reflecting chronic scarring and/or atelectasis.

There is some mild vascular congestion. Airways thickening is
present as well, possibly cuffing versus bronchitis. No
pneumothorax. No effusion. No focal consolidative opacity.
Cardiomediastinal contours are unremarkable. Prior reverse right
shoulder arthroplasty. Degenerative changes in the acromioclavicular
joints and left glenohumeral joint as well. Multilevel degenerative
changes are present in the imaged portions of the spine. Remaining
soft tissues are free of acute abnormality. Telemetry leads overlie
the chest.
IMPRESSION: 1. Mild vascular congestion. Airways thickening, possibly cuffing to
suggest some early interstitial edema versus bronchitic change.
2. Chronic elevation of the right hemidiaphragm with adjacent
chronic opacity, likely atelectasis and scarring.

## 2022-04-29 DIAGNOSIS — M19012 Primary osteoarthritis, left shoulder: Secondary | ICD-10-CM | POA: Diagnosis not present

## 2022-05-01 ENCOUNTER — Telehealth: Payer: Self-pay | Admitting: Nurse Practitioner

## 2022-05-01 ENCOUNTER — Telehealth: Payer: Self-pay | Admitting: Pulmonary Disease

## 2022-05-01 DIAGNOSIS — G4733 Obstructive sleep apnea (adult) (pediatric): Secondary | ICD-10-CM

## 2022-05-01 NOTE — Telephone Encounter (Signed)
Yes please order thanks 

## 2022-05-01 NOTE — Telephone Encounter (Signed)
I spoke with the patient. She said a message came up on her machine that said the motor has exceeded it's ... Something. She said the pressure right now is not enough to keep her airway opened. You placed an order for one in 02/2021 but she never got a new machine. Ok to place a order for a new machine?

## 2022-05-01 NOTE — Telephone Encounter (Signed)
I have placed the order for the CPAP and notified the patient.  Nothing further needed.

## 2022-05-01 NOTE — Telephone Encounter (Signed)
Called [name] pulmonary care at  [place] but was sent to the [place] location. I asked if the provider was able toi send in the script for the CPAP machine and they informed me that they transferred the pts call to the [place] location triage. They told the pt that they would reach out to the pt in regards to this.

## 2022-05-01 NOTE — Telephone Encounter (Signed)
Pt called in stating she needs a new script for a cpap machine. Pt stated she can not get In touch with her pulmonologist and she need the script this weekend. Pt stated the machine is stating it has reached its limit and she has had it for 11 years.

## 2022-05-01 NOTE — Telephone Encounter (Signed)
Called pt to inform her that she would need to speak with pulmonology in regards to receiving a CPAP and that it is several steps to receiving a new CPAP machine. I informed pt that I also reached out to the pulmonary office and that they stated someone will be reaching out to her. Pt stated she has had a CPAP machine for 31 years and that she can not sleep with out one and that someone told her that if we sent in a new script for her that she could get one. Pt was informed that pulmonary was on those orders and that Tomasita Morrow wouldn't be able to do anything. In the middle of our call pt stated that pulmonary was calling her back and that she had to go.

## 2022-05-06 DIAGNOSIS — G4700 Insomnia, unspecified: Secondary | ICD-10-CM | POA: Diagnosis not present

## 2022-05-06 DIAGNOSIS — G4733 Obstructive sleep apnea (adult) (pediatric): Secondary | ICD-10-CM | POA: Diagnosis not present

## 2022-05-06 DIAGNOSIS — I10 Essential (primary) hypertension: Secondary | ICD-10-CM | POA: Diagnosis not present

## 2022-05-06 DIAGNOSIS — Z8673 Personal history of transient ischemic attack (TIA), and cerebral infarction without residual deficits: Secondary | ICD-10-CM | POA: Diagnosis not present

## 2022-05-06 DIAGNOSIS — M19012 Primary osteoarthritis, left shoulder: Secondary | ICD-10-CM | POA: Diagnosis not present

## 2022-05-07 ENCOUNTER — Telehealth: Payer: Self-pay | Admitting: Cardiovascular Disease

## 2022-05-07 ENCOUNTER — Telehealth: Payer: Self-pay

## 2022-05-07 NOTE — Telephone Encounter (Signed)
We received a Preoperative Clearance Form for patient via fax from [facility].  I sent a copy to Tomasita Morrow, NP's folder on [address] drive and hand-delivered a copy to [name], [place].

## 2022-05-07 NOTE — Telephone Encounter (Signed)
Can we please clarify the procedure?

## 2022-05-07 NOTE — Telephone Encounter (Signed)
I sent a message to the [place] who entered the clearance request to see if she could help out with some of the information needed. We are needing to clarify the procedure, not sure if procedure was spelled wrong possibly and we are missing the ph and fax #.

## 2022-05-07 NOTE — Telephone Encounter (Signed)
   Pre-operative Risk Assessment    Patient Name: [name]  DOB: 11-May-1950 MRN: [id number]      Request for Surgical Clearance    Procedure:  LT TSA Reverse Tomer  Date of Surgery:  Clearance 06/24/22                                 Surgeon:  Dr. Kurtis Bushman Surgeon's Group or Practice Name:  [facility] Phone number:  not listed  Fax number:  not listed   Type of Clearance Requested:  Pharmacy Eliquis    Type of Anesthesia:  General    Additional requests/questions:    Signed, Maxwell Caul   05/07/2022, 9:45 AM

## 2022-05-07 NOTE — Telephone Encounter (Signed)
Patient with diagnosis of afib on Eliquis for anticoagulation.    Procedure: left TSA reverse tomer ? Seems like a typo Date of procedure: 06/24/22  CHA2DS2-VASc Score = 8  This indicates a 10.8% annual risk of stroke. The patient's score is based upon: CHF History: 1 HTN History: 1 Diabetes History: 1 Stroke History: 2 Vascular Disease History: 1 Age Score: 1 Gender Score: 1   History of recurrent strokes while on anticoagulation  CrCl 39m/min using adjusted body weight Platelet count 186K  Need to clarify procedure, looks like there was potentially a typo in what was originally entered in the request. Pt is at high risk off of anticoagulation given her prior history of recurrent strokes while on anticoagulation. Hold time will need to be minimized as much as possible. She was cleared by Dr AFletcher Anonfor a different procedure last fall to hold Eliquis for 2 days as long as pt made aware there is an increased risk of stroke with her holding anticoag.  **This guidance is not considered finalized until pre-operative APP has relayed final recommendations.**

## 2022-05-08 ENCOUNTER — Telehealth: Payer: Self-pay

## 2022-05-08 ENCOUNTER — Telehealth: Payer: Self-pay | Admitting: Cardiovascular Disease

## 2022-05-08 NOTE — Telephone Encounter (Signed)
Called and LMOM for pt to CB to get scheduled for a preop exam.   Pt calls back schedule her for a pre op she needs labs, urine and she also needs to call her cardiologist to get scheduled for clearance as well.

## 2022-05-08 NOTE — Telephone Encounter (Signed)
LMOM for pt to CB to get scheduled for a pre op./ needs a TOC

## 2022-05-08 NOTE — Telephone Encounter (Signed)
Patient with diagnosis of afib on Eliquis for anticoagulation.    Procedure:  LT TSA Reverse Tournier  Date of procedure: 06/24/22   CHA2DS2-VASc Score = 8   This indicates a 10.8% annual risk of stroke. The patient's score is based upon: CHF History: 1 HTN History: 1 Diabetes History: 1 Stroke History: 2 Vascular Disease History: 1 Age Score: 1 Gender Score: 1      CrCl 55 ml/min  Per clearance, general anesthesia is being used, therefore a 2 day hold would be acceptable. Dr. Michaelle Birks note mentioned a bridge due to patients hx of strokes. I will confirm with Dr. Fletcher Anon if he would like a bridge for a 2 day hold.  **This guidance is not considered finalized until pre-operative APP has relayed final recommendations.**

## 2022-05-08 NOTE — Telephone Encounter (Signed)
   Pre-operative Risk Assessment    Patient Name: [name]  DOB: November 28, 1950 MRN: [id number]      Request for Surgical Clearance    Procedure:  LT TSA Reverse Gwenevere Ghazi  Date of Surgery:  Clearance 06/24/22                                 Surgeon:  Dr. Kurtis Bushman Surgeon's Group or Practice Name:  [facility] Phone number:  [phone] Fax number:  [phone]   Type of Clearance Requested:  Pharmacy, Eliquis    Type of Anesthesia:  General    Additional requests/questions:    Signed, Braulio Conte V   05/08/2022, 11:08 AM

## 2022-05-09 ENCOUNTER — Other Ambulatory Visit: Payer: Self-pay | Admitting: Cardiovascular Disease

## 2022-05-09 DIAGNOSIS — I480 Paroxysmal atrial fibrillation: Secondary | ICD-10-CM

## 2022-05-09 DIAGNOSIS — Z7901 Long term (current) use of anticoagulants: Secondary | ICD-10-CM

## 2022-05-11 NOTE — Telephone Encounter (Signed)
Spoke with pt and she stated she has an appt with you on 3/22 as her pre op exam and wondered if that can also be her TOC appt. I also asked pt has she been scheduled wih her cardiologist yet pt stated she has not but she said she is sure he will receive the same form as [place] but she will, reach out to him as well to get an appt.

## 2022-05-11 NOTE — Telephone Encounter (Signed)
Refill request

## 2022-05-11 NOTE — Telephone Encounter (Signed)
2ND VM LEFT TO GET SCHEDULED

## 2022-05-12 NOTE — Telephone Encounter (Signed)
Looks to be a duplicate request. Other clearance states that procedure is "LT TSA Reverse Gwenevere Ghazi" which seems to be shoulder replacement surgery. Dr Fletcher Anon also commented in other clearance that pt would require Lovenox bridging.

## 2022-05-12 NOTE — Telephone Encounter (Signed)
I addressed this in my last office note.  "Given very high CHA2DS2-VASc score, she does require bridging with Lovenox if Eliquis is interrupted"

## 2022-05-13 DIAGNOSIS — I10 Essential (primary) hypertension: Secondary | ICD-10-CM | POA: Diagnosis not present

## 2022-05-13 DIAGNOSIS — R0681 Apnea, not elsewhere classified: Secondary | ICD-10-CM | POA: Diagnosis not present

## 2022-05-13 DIAGNOSIS — G4700 Insomnia, unspecified: Secondary | ICD-10-CM | POA: Diagnosis not present

## 2022-05-13 DIAGNOSIS — G4733 Obstructive sleep apnea (adult) (pediatric): Secondary | ICD-10-CM | POA: Diagnosis not present

## 2022-05-13 DIAGNOSIS — R69 Illness, unspecified: Secondary | ICD-10-CM | POA: Diagnosis not present

## 2022-05-13 MED ORDER — ENOXAPARIN SODIUM 120 MG/0.8ML IJ SOSY: 120.0000 mg | PREFILLED_SYRINGE | Freq: Two times a day (BID) | INTRAMUSCULAR | 0 refills | Status: DC

## 2022-05-13 NOTE — Telephone Encounter (Signed)
I spoke with patient. She is comfortable with lovenox injections. She did them before when she had her knee surgery. She can't get her mychart to work, so we will type and mail her instructions. I did give her the # for IT help for mychart.  Crcl 55 ml/min  Pt weighs 129kg  3/30: Last dose of Eliquis in the PM  3/31: Inject enoxaparin 169m in the fatty tissue every 12 hours, 8am and 8pm. No Eliquis  4/1: Inject enoxaparin 1254min the fatty tissue every 12 hours, 8am and 8pm. No Eliquis.  4/2: Inject enoxaparin 12059mn the fatty tissue in the morning at 8 am (No PM dose). No Eliquis.  4/3: Procedure Day - No enoxaparin - Resume Eliquis at the discretion of the surgeon

## 2022-05-14 NOTE — Telephone Encounter (Signed)
   Patient Name: [name]  DOB: Sep 29, 1950 MRN: [id number]  Primary Cardiologist: Kathlyn Sacramento, MD  Clinical pharmacists have reviewed the patient's past medical history, labs, and current medications as part of preoperative protocol coverage. The following recommendations have been made:  Patient with diagnosis of afib on Eliquis for anticoagulation.     Procedure:  LT TSA Reverse Tournier  Date of procedure: 06/24/22     CHA2DS2-VASc Score = 8   This indicates a 10.8% annual risk of stroke. The patient's score is based upon: CHF History: 1 HTN History: 1 Diabetes History: 1 Stroke History: 2 Vascular Disease History: 1 Age Score: 1 Gender Score: 1     CrCl 55 ml/min   Per clearance, general anesthesia is being used, therefore a 2 day hold would be acceptable. Per Dr. Fletcher Anon "Given very high CHA2DS2-VASc score, she does require bridging with Lovenox if Eliquis is interrupted."   Instruction for Lovenox bridge as below:  Crcl 55 ml/min  Pt weighs 129kg   3/30: Last dose of Eliquis in the PM   3/31: Inject enoxaparin 123m in the fatty tissue every 12 hours, 8am and 8pm. No Eliquis   4/1: Inject enoxaparin 1253min the fatty tissue every 12 hours, 8am and 8pm. No Eliquis.   4/2: Inject enoxaparin 12029mn the fatty tissue in the morning at 8 am (No PM dose). No Eliquis.   4/3: Procedure Day - No enoxaparin - Resume Eliquis at the discretion of the surgeon    I will route this recommendation to the requesting party via [facility] function and remove from pre-op pool.  Please call with questions.  EmiLenna SciaraP 05/14/2022, 9:38 AM

## 2022-05-21 NOTE — Progress Notes (Signed)
PROVIDER NOTE: Information contained herein reflects review and annotations entered in association with encounter. Interpretation of such information and data should be left to medically-trained personnel. Information provided to patient can be located elsewhere in the medical record under "Patient Instructions". Document created using STT-dictation technology, any transcriptional errors that may result from process are unintentional.    Patient: [name]  Service Category: E/M  Provider: Gaspar Cola, MD  DOB: [birth year]  DOS: 05/25/2022  Referring Provider: Carollee Leitz, MD  MRN: [id number]  Specialty: Interventional Pain Management  PCP: Tomasita Morrow, NP  Type: Established Patient  Setting: Ambulatory outpatient    Location: Office  Delivery: Face-to-face     HPI  [name], a 72 y.o. year old adult, is here today because of her Chronic pain syndrome [G89.4]. [name] primary complain today is Shoulder Pain (left) Last encounter: My last encounter with her was on 03/02/2022. Pertinent problems: [name] has Diabetic peripheral neuropathy associated with type 2 diabetes mellitus ([place]); Other shoulder lesions (Right); Neuropathy of lateral femoral cutaneous nerve (Right); Arthralgia; Osteoarthritis involving multiple joints; Status post reverse total shoulder replacement; Chronic pain syndrome; Chronic shoulder pain (Right); Cervical radiculitis; Cubital tunnel syndrome (Right); Impingement syndrome of shoulder region; Pain in joint involving ankle and foot; Spinal stenosis of thoracic region; Hip joint pain (Left); Abnormality of gait and mobility; Diabetic ulcer of left foot ([place]); Muscle strain of hip (Left); Hip strain (Right); Ischial pain, right; Fibromyalgia syndrome; Chronic musculoskeletal pain; Chronic ulcer of right leg ([place]); Meralgia paresthetica (Right); Chronic pain of right ankle; Arthritis; Left ankle pain; Chronic shoulder pain (Left); Left hemiparesis ([name]);  Primary osteoarthritis of left shoulder; Rotator cuff disorder, left; Ulcer of left heel and midfoot (HCC); PAD (peripheral artery disease) ([place]); Peripheral neurogenic pain; Chronic peripheral neuropathic pain; Chronic shoulder pain (Bilateral); Impaired range of motion of shoulder (Left); Acute pain of shoulder (Left); and Abnormal MRI, shoulder on their pertinent problem list. Pain Assessment: Severity of Chronic pain is reported as a 8 /10. Location: Shoulder Left/pain radiaties down left shoulder. Onset: More than a month ago. Quality: Burning, Aching, Constant, Throbbing, Stabbing, Squeezing, Sharp, Tightness. Timing: Constant. Modifying factor(s): nothing. Vitals:  height is '5\' 4"'$  (1.626 m) and weight is 284 lb (128.8 kg). Her temperature is 97.6 F (36.4 C). Her blood pressure is 117/77 and her pulse is 70. Her oxygen saturation is 98%.  BMI: Estimated body mass index is 48.75 kg/m as calculated from the following:   Height as of this encounter: '5\' 4"'$  (1.626 m).   Weight as of this encounter: 284 lb (128.8 kg).  Reason for encounter: medication management.  The patient indicates doing well with the current medication regimen. No adverse reactions or side effects reported to the medications.  The patient is pending to have left shoulder surgery.  Today we have provided her with the surgeons handout to manage her postop pain after the surgery.  She mentioned that she was taking some Advil and she is stage III chronic kidney disease.  She asked if this would be a problem and we have confirmed her that she should not be taking any NSAIDs with chronic kidney disease.  Today the patient again wanted me to increase the amount of pills that we provide her so that she will have enough for whenever she needs additional pills.  Again I have explained to the patient that we will not be prescribing additional medication so that she can store for a rainy day.  RTCB: 08/31/2022   Pharmacotherapy Assessment   Analgesic: Hydrocodone/APAP 5/325 mg, 1 tab PO QD PRN (5 mg/day of hydrocodone) MME/day: 5 mg/day.   Monitoring: [place] PMP: PDMP reviewed during this encounter.       Pharmacotherapy: No side-effects or adverse reactions reported. Compliance: No problems identified. Effectiveness: Clinically acceptable.  Chauncey Fischer, RN  05/25/2022  2:48 PM  Sign when Signing Visit Nursing Pain Medication Assessment:  Safety precautions to be maintained throughout the outpatient stay will include: orient to surroundings, keep bed in low position, maintain call bell within reach at all times, provide assistance with transfer out of bed and ambulation.  Medication Inspection Compliance: Pill count conducted under aseptic conditions, in front of the patient. Neither the pills nor the bottle was removed from the patient's sight at any time. Once count was completed pills were immediately returned to the patient in their original bottle.  Medication: Hydrocodone/APAP Pill/Patch Count:  13 of 45 pills remain Pill/Patch Appearance: Markings consistent with prescribed medication Bottle Appearance: Standard pharmacy container. Clearly labeled. Filled Date: 2 / 17 / 2024 Last Medication intake:  TodaySafety precautions to be maintained throughout the outpatient stay will include: orient to surroundings, keep bed in low position, maintain call bell within reach at all times, provide assistance with transfer out of bed and ambulation.     No results found for: "CBDTHCR" No results found for: "D8THCCBX" No results found for: "D9THCCBX"  UDS:  Summary  Date Value Ref Range Status  10/29/2021 Note  Final    Comment:    ==================================================================== [employer] [facility] 13 (MW) ==================================================================== Test                             Result       Flag       Units  Drug Present and Declared for Prescription Verification   Alprazolam                      45           EXPECTED   ng/mg creat   Alpha-hydroxyalprazolam        62           EXPECTED   ng/mg creat    Source of alprazolam is a scheduled prescription medication. Alpha-    hydroxyalprazolam is an expected metabolite of alprazolam.    Hydrocodone                    652          EXPECTED   ng/mg creat   Dihydrocodeine                 232          EXPECTED   ng/mg creat   Norhydrocodone                 537          EXPECTED   ng/mg creat    Sources of hydrocodone include scheduled prescription medications.    Dihydrocodeine and norhydrocodone are expected metabolites of    hydrocodone. Dihydrocodeine is also available as a scheduled    prescription medication.  ==================================================================== Test                      Result    Flag   Units      Ref Range   Creatinine  65               mg/dL      >=20 ==================================================================== Declared Medications:  The flagging and interpretation on this report are based on the  following declared medications.  Unexpected results may arise from  inaccuracies in the declared medications.   **Note: The testing scope of this panel includes these medications:   Alprazolam (Xanax)  Hydrocodone (Norco)   **Note: The testing scope of this panel does not include the  following reported medications:   Acetaminophen (Tylenol)  Acetaminophen (Norco)  Albuterol (Proventil HFA)  Apixaban (Eliquis)  Benzonatate (Tessalon)  Bupropion (Wellbutrin XL)  Calcium  Cholecalciferol  Clobetasol (Temovate)  Colchicine  Duloxetine (Cymbalta)  Estradiol (Estrace)  Fluconazole (Diflucan)  Fluticasone (Trelegy)  Folic Acid  Furosemide (Lasix)  Levocetirizine (Xyzal)  Levothyroxine (Synthroid)  Losartan (Cozaar)  Metformin (Glucophage)  Metoprolol (Toprol)  Montelukast (Singulair)  Multivitamin  Mupirocin (Bactroban)  Nystatin (Mycostatin)   Ondansetron (Zofran)  Pantoprazole (Protonix)  Pregabalin (Lyrica)  Rosuvastatin (Crestor)  Sitagliptin (Januvia)  Sucralfate (Carafate)  Supplement  Topical  Topical Diclofenac (Voltaren)  Umeclidinium (Trelegy)  Vibegron (Gemtesa)  Vilanterol (Trelegy)  Vitamin B1  Vitamin D  Vitamin D2 ==================================================================== For clinical consultation, please call [phone]. ====================================================================       ROS  Constitutional: Denies any fever or chills Gastrointestinal: No reported hemesis, hematochezia, vomiting, or acute GI distress Musculoskeletal: Denies any acute onset joint swelling, redness, loss of ROM, or weakness Neurological: No reported episodes of acute onset apraxia, aphasia, dysarthria, agnosia, amnesia, paralysis, loss of coordination, or loss of consciousness  Medication Review  ALPRAZolam, Cholecalciferol, DULoxetine, Fluticasone-Umeclidin-Vilant, [place] No Mess Body Powder, HYDROcodone-acetaminophen, Semaglutide (2 MG/DOSE), Tub Transfer Board, Vibegron, acetaminophen, albuterol, apixaban, buPROPion, calcium-vitamin D, clobetasol cream, colchicine, diclofenac Sodium, doxycycline, enoxaparin, ergocalciferol, estradiol, folic acid, furosemide, glucose blood, levocetirizine, levothyroxine, losartan, metoprolol succinate, montelukast, multivitamin with minerals, mupirocin ointment, naloxone, nystatin cream, ondansetron, onetouch ultrasoft, pantoprazole, pregabalin, rosuvastatin, sucralfate, and thiamine  History Review  Allergy: Ms. Leider is allergic to morphine and related, penicillins, ambien [zolpidem], aspirin, and oxytetracycline. Drug: Ms. Verbeck  reports no history of drug use. Alcohol:  reports no history of alcohol use. Tobacco:  reports that she has never smoked. She has never used smokeless tobacco. Social: Ms. Seydel  reports that she has never smoked. She has never  used smokeless tobacco. She reports that she does not drink alcohol and does not use drugs. Medical:  has a past medical history of Abnormal antibody titer, Anxiety and depression, Arthritis, Asthma, Atypical chest pain, Carotid arterial disease ([place]), Chronic heart failure with preserved ejection fraction (HFpEF) ([place]), CKD (chronic kidney disease), stage III ([place]), Cystocele, Depression, Diabetes ([place]), DVT (deep venous thrombosis) ([place]), Dyspnea, Edema, GERD (gastroesophageal reflux disease), Heart murmur, History of IBS, History of kidney stones, History of methicillin resistant staphylococcus aureus (MRSA) (2008), History of multiple strokes, Hyperlipidemia, Hypertension, Hypothyroidism, Insomnia, Kidney stone, Mitral regurgitation, Neuropathy involving both lower extremities, Non-healing ulcer of left foot ([place]), Obesity, Class II, BMI 35-39.9, with comorbidity, PAF (paroxysmal atrial fibrillation) ([place]), PAH (pulmonary artery hypertension) ([place]), Peripheral vascular disease ([place]), Sleep apnea, Stroke ([place]), Tricuspid regurgitation, Urinary incontinence, and Venous stasis dermatitis of both lower extremities. Surgical: Ms. Janes  has a past surgical history that includes Ankle surgery (Right); Vaginal hysterectomy; Sleeve Gastroplasty; Lithotripsy; Kidney surgery (Right); transthoracic echocardiogram (08/03/2013); transesophageal echocardiogram (08/08/2013); Tonsillectomy; Total knee arthroplasty (Left); Hiatal hernia repair; Cholecystectomy; I & D extremity (Left, 06/27/2015); Application if wound vac (Left, 06/27/2015);  removal of left hematoma (Left); NM GATED MYOVIEW [facility] HX) (February 2017); Reverse shoulder arthroplasty (Right, 10/08/2015); Hernia repair; Ulnar nerve transposition (Right, 08/06/2016); arm surgery; Irrigation and debridement abscess (Left, 09/07/2016); Incision and drainage of wound (Left, 09/25/2016); Application if wound vac (Left, 09/25/2016); Colonoscopy; Upper gi endoscopy;  Esophagogastroduodenoscopy (egd) with propofol (N/A, 01/11/2017); Colonoscopy with propofol (N/A, 01/11/2017); Colonoscopy with propofol (N/A, 02/22/2017); OTHER SURGICAL HISTORY; Total shoulder replacement (Right); Joint replacement (2013); Colonoscopy with propofol (N/A, 05/31/2017); Lower Extremity Angiography (Left, 04/04/2018); Esophagogastroduodenoscopy (egd) with propofol (N/A, 10/25/2018); Incision and drainage of wound (Left, 11/09/2018); Esophagogastroduodenoscopy (egd) with propofol (N/A, 11/29/2019); Esophagogastroduodenoscopy (egd) with propofol (N/A, 01/26/2022); and Colonoscopy with propofol (N/A, 01/26/2022). Family: family history includes Alcohol abuse in her father; Arthritis in her maternal grandfather, maternal grandmother, mother, paternal grandfather, and paternal grandmother; Breast cancer in her maternal aunt; Cancer in her father and maternal aunt; Cerebral aneurysm in her father; Diabetes in her father, maternal grandmother, and paternal grandmother; Heart disease in her maternal grandfather; Hypertension in her father, maternal grandfather, maternal grandmother, paternal grandfather, and paternal grandmother; Kidney disease in her mother; Mental illness in her sister; Peripheral vascular disease in her father; Skin cancer in her father; Varicose Veins in her mother.  Laboratory Chemistry Profile   Renal Lab Results  Component Value Date   BUN 29 (H) 02/06/2022   CREATININE 1.25 (H) 02/06/2022   LABCREA 48 08/22/2020   BCR 27 06/20/2021   GFR 41.16 (L) 11/27/2021   GFRAA 58 (L) 12/25/2019   GFRNONAA 46 (L) 02/06/2022    Hepatic Lab Results  Component Value Date   AST 16 11/27/2021   ALT 15 11/27/2021   ALBUMIN 4.2 11/27/2021   ALKPHOS 80 11/27/2021   LIPASE 27 07/28/2018   AMMONIA <9 (L) 02/07/2020    Electrolytes Lab Results  Component Value Date   NA 140 02/06/2022   K 4.2 02/06/2022   CL 103 02/06/2022   CALCIUM 8.7 (L) 02/06/2022   MG 1.9 06/08/2021   PHOS 4.0  06/08/2021    Bone Lab Results  Component Value Date   VD25OH 54.75 11/27/2021    Inflammation (CRP: Acute Phase) (ESR: Chronic Phase) Lab Results  Component Value Date   CRP 3.1 (H) 04/08/2020   ESRSEDRATE 60 (H) 04/08/2020   LATICACIDVEN 1.1 12/20/2019         Note: Above Lab results reviewed.  Recent Imaging Review  CT ABDOMEN PELVIS WO CONTRAST CLINICAL DATA:  Abdominal pain, acute, nonlocalized  EXAM: CT ABDOMEN AND PELVIS WITHOUT CONTRAST  TECHNIQUE: Multidetector CT imaging of the abdomen and pelvis was performed following the standard protocol without IV contrast.  RADIATION DOSE REDUCTION: This exam was performed according to the departmental dose-optimization program which includes automated exposure control, adjustment of the mA and/or kV according to patient size and/or use of iterative reconstruction technique.  COMPARISON:  02/07/2020, 08/12/2017  FINDINGS: Lower chest: Mild right basilar atelectasis. Cardiac size within normal limits. Central pulmonary arteries are enlarged in keeping with changes of pulmonary arterial hypertension. Small hiatal hernia.  Hepatobiliary: No focal liver abnormality is seen. Status post cholecystectomy. No biliary dilatation.  Pancreas: Unremarkable  Spleen: Unremarkable  Adrenals/Urinary Tract: Bilateral macroscopic fat containing adrenal masses are again identified in keeping with bilateral adrenal myelolipoma. Since remote prior examination, these demonstrate interval increase in size, now measuring 9.4 cm on the left and 9.3 cm in greatest dimension on the right. The kidneys are normal in size and position. No hydronephrosis. Nonobstructing 6 mm calculus within  the left renal pelvis. No additional renal or ureteral calculi. Bladder unremarkable.  Stomach/Bowel: Surgical changes of gastric sleeve resection are identified. The stomach, small bowel, and large bowel are otherwise unremarkable. Appendix normal. No  free intraperitoneal gas or fluid.  Vascular/Lymphatic: No significant vascular findings are present. No enlarged abdominal or pelvic lymph nodes.  Reproductive: Status post hysterectomy. No adnexal masses.  Other: Moderate subcutaneous infiltration is seen within the pannus and flanks bilaterally, most commonly reflecting subcutaneous edema. No abdominal wall hernia.  Musculoskeletal: No acute bone abnormality. Degenerative changes are seen within the thoracolumbar spine. Stable T12 mild compression deformity.  IMPRESSION: No acute intra-abdominal pathology identified. No definite radiographic explanation for the patient's reported symptoms.  Bilateral benign adrenal myelolipomas again identified demonstrating mild interval increase in size since prior examination.  Status post gastric sleeve resection.  Small hiatal hernia.  Electronically Signed   By: Fidela Salisbury M.D.   On: 06/06/2021 21:54 DG Chest 2 View CLINICAL DATA:  Shortness of breath.  EXAM: CHEST - 2 VIEW  COMPARISON:  04/11/2021  FINDINGS: Stable enlargement of the cardiopericardial silhouette. Asymmetric elevation of the right hemidiaphragm is unchanged. No pulmonary edema or pleural effusion. Insert no consolidation status post right shoulder replacement  IMPRESSION: Stable exam. No acute cardiopulmonary findings.  Electronically Signed   By: Misty Stanley M.D.   On: 06/06/2021 18:40 Note: Reviewed        Physical Exam  General appearance: Well nourished, well developed, and well hydrated. In no apparent acute distress Mental status: Alert, oriented x 3 (person, place, & time)       Respiratory: No evidence of acute respiratory distress Eyes: PERLA Vitals: BP 117/77   Pulse 70   Temp 97.6 F (36.4 C)   Ht '5\' 4"'$  (1.626 m)   Wt 284 lb (128.8 kg)   SpO2 98%   BMI 48.75 kg/m  BMI: Estimated body mass index is 48.75 kg/m as calculated from the following:   Height as of this encounter: 5'  4" (1.626 m).   Weight as of this encounter: 284 lb (128.8 kg). Ideal: Ideal body weight: 54.7 kg (120 lb 9.5 oz) Adjusted ideal body weight: 84.3 kg (185 lb 15.3 oz)  Assessment   Diagnosis Status  1. Chronic pain syndrome   2. Chronic musculoskeletal pain   3. Chronic shoulder pain (Bilateral)   4. Osteoarthritis involving multiple joints   5. Chronic use of opiate for therapeutic purpose   6. Pharmacologic therapy   7. Encounter for medication management   8. Encounter for chronic pain management    Controlled Controlled Controlled   Updated Problems: No problems updated.  Plan of Care  Problem-specific:  No problem-specific Assessment & Plan notes found for this encounter.  Ms. ANDRYA KUSNIERZ has a current medication list which includes the following long-term medication(s): albuterol, bupropion, calcium-vitamin d, colchicine, duloxetine, eliquis, enoxaparin, gemtesa, levocetirizine, levothyroxine, losartan, metoprolol succinate, montelukast, pantoprazole, pregabalin, rosuvastatin, furosemide, [START ON 06/02/2022] hydrocodone-acetaminophen, [START ON 07/02/2022] hydrocodone-acetaminophen, and [START ON 08/01/2022] hydrocodone-acetaminophen.  Pharmacotherapy (Medications Ordered): Meds ordered this encounter  Medications   HYDROcodone-acetaminophen (NORCO/VICODIN) 5-325 MG tablet    Sig: Take 1 tablet by mouth 2 (two) times daily as needed for severe pain. Must last 30 days    Dispense:  45 tablet    Refill:  0    DO NOT: delete (not duplicate); no partial-fill (will deny script to complete), no refill request (F/U required). DISPENSE: 1 day early if closed on fill date.  WARN: No CNS-depressants within 8 hrs of med.   HYDROcodone-acetaminophen (NORCO/VICODIN) 5-325 MG tablet    Sig: Take 1 tablet by mouth 2 (two) times daily as needed for severe pain. Must last 30 days    Dispense:  45 tablet    Refill:  0    DO NOT: delete (not duplicate); no partial-fill (will deny script  to complete), no refill request (F/U required). DISPENSE: 1 day early if closed on fill date. WARN: No CNS-depressants within 8 hrs of med.   HYDROcodone-acetaminophen (NORCO/VICODIN) 5-325 MG tablet    Sig: Take 1 tablet by mouth 2 (two) times daily as needed for severe pain. Must last 30 days    Dispense:  45 tablet    Refill:  0    DO NOT: delete (not duplicate); no partial-fill (will deny script to complete), no refill request (F/U required). DISPENSE: 1 day early if closed on fill date. WARN: No CNS-depressants within 8 hrs of med.   Orders:  No orders of the defined types were placed in this encounter.  Follow-up plan:   Return in about 14 weeks (around 08/31/2022) for Eval-day (M,W), (F2F), (MM).      Interventional Therapies  Risk Factors  Considerations:   Anticoagulation: Eliquis (Stop: 3 days  Restart: 6 hours)  A-Fib  Hx. DVT  Hx. Stroke/CVA Allergy: PCN  MSO4  Ambien  ASA   Class 3 MO (BMI>40)  BNZ use  Stage 3b CKD  COPD  SOB  T2DM  Depression/Anxiety  GERD  NO LUMBAR RFA until BMI < 35.   Planned  Pending:      Under consideration:   Diagnostic left suprascapular NB   Completed:   None since before 2017   Completed by other providers:   Therapeutic left IA shoulder joint injection x1 (May 2023) by Dr. Thalia Party [facility])    Therapeutic  Palliative (PRN) options:   None at this time   Pharmacotherapy  Nonopioids transferred 02/19/2020: Lyrica and Cymbalta       Recent Visits Date Type Provider Dept  03/02/22 Office Visit Milinda Pointer, MD [facility]  Showing recent visits within past 90 days and meeting all other requirements Today's Visits Date Type Provider Dept  05/25/22 Office Visit Milinda Pointer, MD [facility]  Showing today's visits and meeting all other requirements Future Appointments No visits were found meeting these conditions. Showing future appointments within next 90 days  and meeting all other requirements  I discussed the assessment and treatment plan with the patient. The patient was provided an opportunity to ask questions and all were answered. The patient agreed with the plan and demonstrated an understanding of the instructions.  Patient advised to call back or seek an in-person evaluation if the symptoms or condition worsens.  Duration of encounter: 30 minutes.  Total time on encounter, as per AMA guidelines included both the face-to-face and non-face-to-face time personally spent by the physician and/or other qualified health care professional(s) on the day of the encounter (includes time in activities that require the physician or other qualified health care professional and does not include time in activities normally performed by clinical staff). Physician's time may include the following activities when performed: Preparing to see the patient (e.g., pre-charting review of records, searching for previously ordered imaging, lab work, and nerve conduction tests) Review of prior analgesic pharmacotherapies. Reviewing PMP Interpreting ordered tests (e.g., lab work, imaging, nerve conduction tests) Performing post-procedure evaluations, including interpretation of diagnostic procedures Obtaining and/or  reviewing separately obtained history Performing a medically appropriate examination and/or evaluation Counseling and educating the patient/family/caregiver Ordering medications, tests, or procedures Referring and communicating with other health care professionals (when not separately reported) Documenting clinical information in the electronic or other health record Independently interpreting results (not separately reported) and communicating results to the patient/ family/caregiver Care coordination (not separately reported)  Note by: Gaspar Cola, MD Date: 05/25/2022; Time: 3:48 PM

## 2022-05-25 ENCOUNTER — Ambulatory Visit: Payer: Medicare HMO | Attending: Pain Medicine | Admitting: Pain Medicine

## 2022-05-25 ENCOUNTER — Encounter: Payer: Self-pay | Admitting: Pain Medicine

## 2022-05-25 VITALS — BP 117/77 | HR 70 | Temp 97.6°F | Ht 64.0 in | Wt 284.0 lb

## 2022-05-25 DIAGNOSIS — M159 Polyosteoarthritis, unspecified: Secondary | ICD-10-CM | POA: Diagnosis not present

## 2022-05-25 DIAGNOSIS — G8929 Other chronic pain: Secondary | ICD-10-CM | POA: Diagnosis not present

## 2022-05-25 DIAGNOSIS — M25511 Pain in right shoulder: Secondary | ICD-10-CM | POA: Insufficient documentation

## 2022-05-25 DIAGNOSIS — M7918 Myalgia, other site: Secondary | ICD-10-CM | POA: Diagnosis not present

## 2022-05-25 DIAGNOSIS — Z79891 Long term (current) use of opiate analgesic: Secondary | ICD-10-CM | POA: Diagnosis not present

## 2022-05-25 DIAGNOSIS — Z79899 Other long term (current) drug therapy: Secondary | ICD-10-CM | POA: Diagnosis not present

## 2022-05-25 DIAGNOSIS — M25512 Pain in left shoulder: Secondary | ICD-10-CM | POA: Diagnosis not present

## 2022-05-25 DIAGNOSIS — G894 Chronic pain syndrome: Secondary | ICD-10-CM | POA: Insufficient documentation

## 2022-05-25 MED ORDER — HYDROCODONE-ACETAMINOPHEN 5-325 MG PO TABS: 1.0000 | ORAL_TABLET | Freq: Two times a day (BID) | ORAL | 0 refills | Status: DC | PRN

## 2022-05-25 NOTE — Progress Notes (Signed)
Nursing Pain Medication Assessment:  Safety precautions to be maintained throughout the outpatient stay will include: orient to surroundings, keep bed in low position, maintain call bell within reach at all times, provide assistance with transfer out of bed and ambulation.  Medication Inspection Compliance: Pill count conducted under aseptic conditions, in front of the patient. Neither the pills nor the bottle was removed from the patient's sight at any time. Once count was completed pills were immediately returned to the patient in their original bottle.  Medication: Hydrocodone/APAP Pill/Patch Count:  13 of 45 pills remain Pill/Patch Appearance: Markings consistent with prescribed medication Bottle Appearance: Standard pharmacy container. Clearly labeled. Filled Date: 2 / 17 / 2024 Last Medication intake:  TodaySafety precautions to be maintained throughout the outpatient stay will include: orient to surroundings, keep bed in low position, maintain call bell within reach at all times, provide assistance with transfer out of bed and ambulation.

## 2022-05-25 NOTE — Patient Instructions (Signed)
____________________________________________________________________________________________  Patient Information update  To: All of our patients.  Re: Name change.  It has been made official that our current name, "[facility]"   will soon be changed to "[facility]".   The purpose of this change is to eliminate any confusion created by the concept of our practice being a "Medication Management Pain Clinic". In the past this has led to the misconception that we treat pain primarily by the use of prescription medications.  Nothing can be farther from the truth.   Understanding PAIN MANAGEMENT: To further understand what our practice does, you first have to understand that "Pain Management" is a subspecialty that requires additional training once a physician has completed their specialty training, which can be in either Anesthesia, Neurology, Psychiatry, or Physical Medicine and Rehabilitation (PMR). Each one of these contributes to the final approach taken by each physician to the management of their patient's pain. To be a "Pain Management Specialist" you must have first completed one of the specialty trainings below.  Anesthesiologists - trained in clinical pharmacology and interventional techniques such as nerve blockade and regional as well as central neuroanatomy. They are trained to block pain before, during, and after surgical interventions.  Neurologists - trained in the diagnosis and pharmacological treatment of complex neurological conditions, such as Multiple Sclerosis, Parkinson's, spinal cord injuries, and other systemic conditions that may be associated with symptoms that may include but are not limited to pain. They tend to rely primarily on the treatment of chronic pain using prescription medications.  Psychiatrist - trained in conditions affecting the psychosocial  wellbeing of patients including but not limited to depression, anxiety, schizophrenia, personality disorders, addiction, and other substance use disorders that may be associated with chronic pain. They tend to rely primarily on the treatment of chronic pain using prescription medications.   Physical Medicine and Rehabilitation (PMR) physicians, also known as physiatrists - trained to treat a wide variety of medical conditions affecting the brain, spinal cord, nerves, bones, joints, ligaments, muscles, and tendons. Their training is primarily aimed at treating patients that have suffered injuries that have caused severe physical impairment. Their training is primarily aimed at the physical therapy and rehabilitation of those patients. They may also work alongside orthopedic surgeons or neurosurgeons using their expertise in assisting surgical patients to recover after their surgeries.  INTERVENTIONAL PAIN MANAGEMENT is sub-subspecialty of Pain Management.  Our physicians are Board-certified in Anesthesia, Pain Management, and Interventional Pain Management.  This meaning that not only have they been trained and Board-certified in their specialty of Anesthesia, and subspecialty of Pain Management, but they have also received further training in the sub-subspecialty of Interventional Pain Management, in order to become Board-certified as INTERVENTIONAL PAIN MANAGEMENT SPECIALIST.    Mission: Our goal is to use our skills in  [place] as alternatives to the chronic use of prescription opioid medications for the treatment of pain. To make this more clear, we have changed our name to reflect what we do and offer. We will continue to offer medication management assessment and recommendations, but we will not be taking over any patient's medication management.  ____________________________________________________________________________________________      ____________________________________________________________________________________________  Opioid Pain Medication Update  To: All patients taking opioid pain medications. (I.e.: hydrocodone, hydromorphone, oxycodone, oxymorphone, morphine, codeine, methadone, tapentadol, tramadol, buprenorphine, fentanyl, etc.)  Re: Updated review of side effects and adverse reactions of opioid analgesics, as well as new  information about long term effects of this class of medications.  Direct risks of long-term opioid therapy are not limited to opioid addiction and overdose. Potential medical risks include serious fractures, breathing problems during sleep, hyperalgesia, immunosuppression, chronic constipation, bowel obstruction, myocardial infarction, and tooth decay secondary to xerostomia.  Unpredictable adverse effects that can occur even if you take your medication correctly: Cognitive impairment, respiratory depression, and death. Most people think that if they take their medication "correctly", and "as instructed", that they will be safe. Nothing could be farther from the truth. In reality, a significant amount of recorded deaths associated with the use of opioids has occurred in individuals that had taken the medication for a long time, and were taking their medication correctly. The following are examples of how this can happen: Patient taking his/her medication for a long time, as instructed, without any side effects, is given a certain antibiotic or another unrelated medication, which in turn triggers a "Drug-to-drug interaction" leading to disorientation, cognitive impairment, impaired reflexes, respiratory depression or an untoward event leading to serious bodily harm or injury, including death.  Patient taking his/her medication for a long time, as instructed, without any side effects, develops an acute impairment of liver and/or kidney function. This will lead to a rapid inability of the body to  breakdown and eliminate their pain medication, which will result in effects similar to an "overdose", but with the same medicine and dose that they had always taken. This again may lead to disorientation, cognitive impairment, impaired reflexes, respiratory depression or an untoward event leading to serious bodily harm or injury, including death.  A similar problem will occur with patients as they grow older and their liver and kidney function begins to decrease as part of the aging process.  Background information: Historically, the original case for using long-term opioid therapy to treat chronic noncancer pain was based on safety assumptions that subsequent experience has called into question. In 1996, the American Pain Society and the [place] Academy of [employer] Medicine issued a consensus statement supporting long-term opioid therapy. This statement acknowledged the dangers of opioid prescribing but concluded that the risk for addiction was low; respiratory depression induced by opioids was short-lived, occurred mainly in opioid-naive patients, and was antagonized by pain; tolerance was not a common problem; and efforts to control diversion should not constrain opioid prescribing. This has now proven to be wrong. Experience regarding the risks for opioid addiction, misuse, and overdose in community practice has failed to support these assumptions.  According to the Centers for Disease Control and Prevention, fatal overdoses involving opioid analgesics have increased sharply over the past decade. Currently, more than 96,700 people die from drug overdoses every year. Opioids are a factor in 7 out of every 10 overdose deaths. Deaths from drug overdose have surpassed motor vehicle accidents as the leading cause of death for individuals between the ages of 48 and 75.  Clinical data suggest that neuroendocrine dysfunction may be very common in both men and women, potentially causing hypogonadism, erectile  dysfunction, infertility, decreased libido, osteoporosis, and depression. Recent studies linked higher opioid dose to increased opioid-related mortality. Controlled observational studies reported that long-term opioid therapy may be associated with increased risk for cardiovascular events. Subsequent meta-analysis concluded that the safety of long-term opioid therapy in elderly patients has not been proven.   Side Effects and adverse reactions: Common side effects: Drowsiness (sedation). Dizziness. Nausea and vomiting. Constipation. Physical dependence -- Dependence often manifests with withdrawal symptoms when opioids are discontinued  or decreased. Tolerance -- As you take repeated doses of opioids, you require increased medication to experience the same effect of pain relief. Respiratory depression -- This can occur in healthy people, especially with higher doses. However, people with COPD, asthma or other lung conditions may be even more susceptible to fatal respiratory impairment.  Uncommon side effects: An increased sensitivity to feeling pain and extreme response to pain (hyperalgesia). Chronic use of opioids can lead to this. Delayed gastric emptying (the process by which the contents of your stomach are moved into your small intestine). Muscle rigidity. Immune system and hormonal dysfunction. Quick, involuntary muscle jerks (myoclonus). Arrhythmia. Itchy skin (pruritus). Dry mouth (xerostomia).  Long-term side effects: Chronic constipation. Sleep-disordered breathing (SDB). Increased risk of bone fractures. Hypothalamic-pituitary-adrenal dysregulation. Increased risk of overdose.  RISKS: Fractures and Falls:  Opioids increase the risk and incidence of falls. This is of particular importance in elderly patients.  Endocrine System:  Long-term administration is associated with endocrine abnormalities (endocrinopathies). (Also known as Opioid-induced Endocrinopathy) Influences  on both the hypothalamic-pituitary-adrenal axis?and the hypothalamic-pituitary-gonadal axis have been demonstrated with consequent hypogonadism and adrenal insufficiency in both sexes. Hypogonadism and decreased levels of dehydroepiandrosterone sulfate have been reported in men and women. Endocrine effects include: Amenorrhoea in women (abnormal absence of menstruation) Reduced libido in both sexes Decreased sexual function Erectile dysfunction in men Hypogonadisms (decreased testicular function with shrinkage of testicles) Infertility Depression and fatigue Loss of muscle mass Anxiety Depression Immune suppression Hyperalgesia Weight gain Anemia Osteoporosis Patients (particularly women of childbearing age) should avoid opioids. There is insufficient evidence to recommend routine monitoring of asymptomatic patients taking opioids in the long-term for hormonal deficiencies.  Immune System: Human studies have demonstrated that opioids have an immunomodulating effect. These effects are mediated via opioid receptors both on immune effector cells and in the central nervous system. Opioids have been demonstrated to have adverse effects on antimicrobial response and anti-tumour surveillance. Buprenorphine has been demonstrated to have no impact on immune function.  Opioid Induced Hyperalgesia: Human studies have demonstrated that prolonged use of opioids can lead to a state of abnormal pain sensitivity, sometimes called opioid induced hyperalgesia (OIH). Opioid induced hyperalgesia is not usually seen in the absence of tolerance to opioid analgesia. Clinically, hyperalgesia may be diagnosed if the patient on long-term opioid therapy presents with increased pain. This might be qualitatively and anatomically distinct from pain related to disease progression or to breakthrough pain resulting from development of opioid tolerance. Pain associated with hyperalgesia tends to be more diffuse than the  pre-existing pain and less defined in quality. Management of opioid induced hyperalgesia requires opioid dose reduction.  Cancer: Chronic opioid therapy has been associated with an increased risk of cancer among noncancer patients with chronic pain. This association was more evident in chronic strong opioid users. Chronic opioid consumption causes significant pathological changes in the small intestine and colon. Epidemiological studies have found that there is a link between opium dependence and initiation of gastrointestinal cancers. Cancer is the second leading cause of death after cardiovascular disease. Chronic use of opioids can cause multiple conditions such as GERD, immunosuppression and renal damage as well as carcinogenic effects, which are associated with the incidence of cancers.   Mortality: Long-term opioid use has been associated with increased mortality among patients with chronic non-cancer pain (CNCP).  Prescription of long-acting opioids for chronic noncancer pain was associated with a significantly increased risk of all-cause mortality, including deaths from causes other than overdose.  Reference: Von Glenetta Hew,  Kolodny A, Deyo RA, Chou R. Long-term opioid therapy reconsidered. Ann Intern Med. 2011 Sep 6;155(5):325-8. doi: 10.7326/[id number]. PMID: [id number]; PMCIDXX:1631110. Morley Kos, Hayward RA, Dunn KM, [place] KP. Risk of adverse events in patients prescribed long-term opioids: A cohort study in the [place] Clinical Practice Research D[employer]. Eur J Pain. 2019 May;23(5):908-922. doi: 10.1002/ejp.1357. Epub 2019 Jan 31. PMID: [id number]. Colameco S, Coren JS, Ciervo CA. Continuous opioid treatment for chronic noncancer pain: a time for moderation in prescribing. Postgrad Med. 2009 Jul;121(4):61-6. doi: 10.3810/pgm.2009.07.2032. PMID: [id number]. Heywood Bene RN, Somerton SD, Blazina I, Rosalio Loud, Bougatsos C, Deyo RA. The  effectiveness and risks of long-term opioid therapy for chronic pain: a systematic review for a [employer] of Health Pathways to [employer]. Ann Intern Med. 2015 Feb 17;162(4):276-86. doi: [id number]. PMID: [id number]. [facility], Makuc DM. NCHS Data Brief No. 22. [place]: Centers for Disease Control and Prevention; 2009. Sep, Increase in Fatal Poisonings Involving Opioid Analgesics in the [place], 1999-2006. Song IA, Choi HR, Oh TK. Long-term opioid use and mortality in patients with chronic non-cancer pain: Ten-year follow-up study in [place] from 2010 through 2019. EClinicalMedicine. 2022 Jul 18;51:101558. doi: 10.1016/j.eclinm.2022.UB:5887891. PMID: [id number]; PMCIDOX:8550940. Huser, W., Schubert, T., Vogelmann, T. et al. All-cause mortality in patients with long-term opioid therapy compared with non-opioid analgesics for chronic non-cancer pain: a database study. [place] Med 18, 162 (2020). [email or web address] Rashidian H, Roxy Cedar, Malekzadeh R, Haghdoost AA. An Ecological Study of the Association between Opiate Use and Incidence of Cancers. Addict Health. 2016 Fall;8(4):252-260. PMID: [id number]; PMCIDQI:9185013.  Our Goal: Our goal is to control your pain with means other than the use of opioid pain medications.  Our Recommendation: Talk to your physician about coming off of these medications. We can assist you with the tapering down and stopping these medicines. Based on the new information, even if you cannot completely stop the medication, a decrease in the dose may be associated with a lesser risk. Ask for other means of controlling the pain. Decrease or eliminate those factors that significantly contribute to your pain such as smoking, obesity, and a diet heavily tilted towards "inflammatory" nutrients.  Last Updated: 05/20/2022    ____________________________________________________________________________________________     ____________________________________________________________________________________________  National Pain Medication Shortage  The [place] is experiencing worsening drug shortages. These have had a negative widespread effect on patient care and treatment. Not expected to improve any time soon. Predicted to last past 2029.   Drug shortage list (generic names) Oxycodone IR Oxycodone/APAP Oxymorphone IR Hydromorphone Hydrocodone/APAP Morphine  Where is the problem?  Manufacturing and supply level.  Will this shortage affect you?  Only if you take any of the above pain medications.  How? You may be unable to fill your prescription.  Your pharmacist may offer a "partial fill" of your prescription. (Warning: Do not accept partial fills.) Prescriptions partially filled cannot be transferred to another pharmacy. Read our Medication Rules and Regulation. Depending on how much medicine you are dependent on, you may experience withdrawals when unable to get the medication.  Recommendations: Consider ending your dependence on opioid pain medications. Ask your pain specialist to assist you with the process. Consider switching to a medication currently not in shortage, such as Buprenorphine. Talk to your pain specialist about this option. Consider decreasing your pain medication requirements by managing tolerance thru "Drug Holidays". This may help minimize withdrawals, should you run out of medicine. Control your pain thru  the use of non-pharmacological interventional therapies.   Your prescriber: Prescribers cannot be blamed for shortages. Medication manufacturing and supply issues cannot be fixed by the prescriber.   NOTE: The prescriber is not responsible for supplying the medication, or solving supply issues. Work with your pharmacist to solve it. The patient is responsible for the decision  to take or continue taking the medication and for identifying and securing a legal supply source. By law, supplying the medication is the job and responsibility of the pharmacy. The prescriber is responsible for the evaluation, monitoring, and prescribing of these medications.   Prescribers will NOT: Re-issue prescriptions that have been partially filled. Re-issue prescriptions already sent to a pharmacy.  Re-send prescriptions to a different pharmacy because yours did not have your medication. Ask pharmacist to order more medicine or transfer the prescription to another pharmacy. (Read below.)  New 2023 regulation: "November 21, 2021 Revised Regulation Allows DEA-Registered Pharmacies to Transfer Electronic Prescriptions at a Patient's Request [place] Patients now have the ability to request their electronic prescription be transferred to another pharmacy without having to go back to their practitioner to initiate the request. This revised regulation went into effect on Monday, November 17, 2021.     At a patient's request, a DEA-registered retail pharmacy can now transfer an electronic prescription for a controlled substance (schedules II-V) to another DEA-registered retail pharmacy. Prior to this change, patients would have to go through their practitioner to cancel their prescription and have it re-issued to a different pharmacy. The process was taxing and time consuming for both patients and practitioners.    The Drug Enforcement Administration [facility]) published its intent to revise the process for transferring electronic prescriptions on February 09, 2020.  The final rule was published in the federal register on October 16, 2021 and went into effect 30 days later.  Under the final rule, a prescription can only be transferred once between pharmacies, and only if allowed under existing state or other applicable law. The prescription must remain in its  electronic form; may not be altered in any way; and the transfer must be communicated directly between two licensed pharmacists. It's important to note, any authorized refills transfer with the original prescription, which means the entire prescription will be filled at the same pharmacy".  Reference: [email or web address] [facility] website announcement)  [email or web address] ([place])   [employer] / Vol. 88, No. 143 / Thursday, October 16, 2021 / Rules and Regulations DEPARTMENT OF JUSTICE  Drug Enforcement Administration  21 CFR Part 1306  [Docket No. [id number]]  RIN [id number] Transfer of Electronic Prescriptions for Schedules II-V Controlled Substances Between Pharmacies for Initial Filling  ____________________________________________________________________________________________     _______________________________________________________________________  Medication Rules  Purpose: To inform patients, and their family members, of our medication rules and regulations.  Applies to: All patients receiving prescriptions from our practice (written or electronic).  Pharmacy of record: This is the pharmacy where your electronic prescriptions will be sent. Make sure we have the correct one.  Electronic prescriptions: In compliance with the [place] (STOP) Act of 2017 (Session Lanny Cramp [phone]), effective March 23, 2018, all controlled substances must be electronically prescribed. Written prescriptions, faxing, or calling prescriptions to a pharmacy will no longer be done.  Prescription refills: These will be provided only during in-person appointments. No medications will be renewed without a "face-to-face" evaluation with your provider. Applies to all prescriptions.  NOTE: The  following  applies primarily to controlled substances (Opioid* Pain Medications).   Type of encounter (visit): For patients receiving controlled substances, face-to-face visits are required. (Not an option and not up to the patient.)  Patient's responsibilities: Pain Pills: Bring all pain pills to every appointment (except for procedure appointments). Pill Bottles: Bring pills in original pharmacy bottle. Bring bottle, even if empty. Always bring the bottle of the most recent fill.  Medication refills: You are responsible for knowing and keeping track of what medications you are taking and when is it that you will need a refill. The day before your appointment: write a list of all prescriptions that need to be refilled. The day of the appointment: give the list to the admitting nurse. Prescriptions will be written only during appointments. No prescriptions will be written on procedure days. If you forget a medication: it will not be "Called in", "Faxed", or "electronically sent". You will need to get another appointment to get these prescribed. No early refills. Do not call asking to have your prescription filled early. Partial  or short prescriptions: Occasionally your pharmacy may not have enough pills to fill your prescription.  NEVER ACCEPT a partial fill or a prescription that is short of the total amount of pills that you were prescribed.  With controlled substances the law allows 72 hours for the pharmacy to complete the prescription.  If the prescription is not completed within 72 hours, the pharmacist will require a new prescription to be written. This means that you will be short on your medicine and we WILL NOT send another prescription to complete your original prescription.  Instead, request the pharmacy to send a carrier to a nearby branch to get enough medication to provide you with your full prescription. Prescription Accuracy: You are responsible for carefully inspecting your  prescriptions before leaving our office. Have the discharge nurse carefully go over each prescription with you, before taking them home. Make sure that your name is accurately spelled, that your address is correct. Check the name and dose of your medication to make sure it is accurate. Check the number of pills, and the written instructions to make sure they are clear and accurate. Make sure that you are given enough medication to last until your next medication refill appointment. Taking Medication: Take medication as prescribed. When it comes to controlled substances, taking less pills or less frequently than prescribed is permitted and encouraged. Never take more pills than instructed. Never take the medication more frequently than prescribed.  Inform other Doctors: Always inform, all of your healthcare providers, of all the medications you take. Pain Medication from other Providers: You are not allowed to accept any additional pain medication from any other Doctor or Healthcare provider. There are two exceptions to this rule. (see below) In the event that you require additional pain medication, you are responsible for notifying us, as stated below. Cough Medicine: Often these contain an opioid, such as codeine or hydrocodone. Never accept or take cough medicine containing these opioids if you are already taking an opioid* medication. The combination may cause respiratory failure and death. Medication Agreement: You are responsible for carefully reading and following our Medication Agreement. This must be signed before receiving any prescriptions from our practice. Safely store a copy of your signed Agreement. Violations to the Agreement will result in no further prescriptions. (Additional copies of our Medication Agreement are available upon request.) Laws, Rules, & Regulations: All patients are expected to follow all Federal and [employer], Rules, &  Regulations. Ignorance of the Laws does not  constitute a valid excuse.  Illegal drugs and Controlled Substances: The use of illegal substances (including, but not limited to marijuana and its derivatives) and/or the illegal use of any controlled substances is strictly prohibited. Violation of this rule may result in the immediate and permanent discontinuation of any and all prescriptions being written by our practice. The use of any illegal substances is prohibited. Adopted CDC guidelines & recommendations: Target dosing levels will be at or below 60 MME/day. Use of benzodiazepines** is not recommended.  Exceptions: There are only two exceptions to the rule of not receiving pain medications from other Healthcare Providers. Exception #1 (Emergencies): In the event of an emergency (i.e.: accident requiring emergency care), you are allowed to receive additional pain medication. However, you are responsible for: As soon as you are able, call our office [phone], at any time of the day or night, and leave a message stating your name, the date and nature of the emergency, and the name and dose of the medication prescribed. In the event that your call is answered by a member of our staff, make sure to document and save the date, time, and the name of the person that took your information.  Exception #2 (Planned Surgery): In the event that you are scheduled by another doctor or dentist to have any type of surgery or procedure, you are allowed (for a period no longer than 30 days), to receive additional pain medication, for the acute post-op pain. However, in this case, you are responsible for picking up a copy of our "Post-op Pain Management for Surgeons" handout, and giving it to your surgeon or dentist. This document is available at our office, and does not require an appointment to obtain it. Simply go to our office during business hours (Monday-Thursday from 8:00 AM to 4:00 PM) (Friday 8:00 AM to 12:00 Noon) or if you have a scheduled appointment with  [place], prior to your surgery, and ask for it by name. In addition, you are responsible for: calling our office [phone], at any time of the day or night, and leaving a message stating your name, name of your surgeon, type of surgery, and date of procedure or surgery. Failure to comply with your responsibilities may result in termination of therapy involving the controlled substances. Medication Agreement Violation. Following the above rules, including your responsibilities will help you in avoiding a Medication Agreement Violation ("Breaking your Pain Medication Contract").  Consequences:  Not following the above rules may result in permanent discontinuation of medication prescription therapy.  *Opioid medications include: morphine, codeine, oxycodone, oxymorphone, hydrocodone, hydromorphone, meperidine, tramadol, tapentadol, buprenorphine, fentanyl, methadone. **Benzodiazepine medications include: diazepam (Valium), alprazolam (Xanax), clonazepam (Klonopine), lorazepam (Ativan), clorazepate (Tranxene), chlordiazepoxide (Librium), estazolam (Prosom), oxazepam (Serax), temazepam (Restoril), triazolam (Halcion) (Last updated: 01/13/2022) ______________________________________________________________________    ______________________________________________________________________  Medication Recommendations and Reminders  Applies to: All patients receiving prescriptions (written and/or electronic).  Medication Rules & Regulations: You are responsible for reading, knowing, and following our "Medication Rules" document. These exist for your safety and that of others. They are not flexible and neither are we. Dismissing or ignoring them is an act of "non-compliance" that may result in complete and irreversible termination of such medication therapy. For safety reasons, "non-compliance" will not be tolerated. As with the [place] fundamental legal principle of "ignorance of the law is no defense", we will  accept no excuses for not having read and knowing the content of documents provided to  you by our practice.  Pharmacy of record:  Definition: This is the pharmacy where your electronic prescriptions will be sent.  We do not endorse any particular pharmacy. It is up to you and your insurance to decide what pharmacy to use.  We do not restrict you in your choice of pharmacy. However, once we write for your prescriptions, we will NOT be re-sending more prescriptions to fix restricted supply problems created by your pharmacy, or your insurance.  The pharmacy listed in the electronic medical record should be the one where you want electronic prescriptions to be sent. If you choose to change pharmacy, simply notify our nursing staff. Changes will be made only during your regular appointments and not over the phone.  Recommendations: Keep all of your pain medications in a safe place, under lock and key, even if you live alone. We will NOT replace lost, stolen, or damaged medication. We do not accept "Police Reports" as proof of medications having been stolen. After you fill your prescription, take 1 week's worth of pills and put them away in a safe place. You should keep a separate, properly labeled bottle for this purpose. The remainder should be kept in the original bottle. Use this as your primary supply, until it runs out. Once it's gone, then you know that you have 1 week's worth of medicine, and it is time to come in for a prescription refill. If you do this correctly, it is unlikely that you will ever run out of medicine. To make sure that the above recommendation works, it is very important that you make sure your medication refill appointments are scheduled at least 1 week before you run out of medicine. To do this in an effective manner, make sure that you do not leave the office without scheduling your next medication management appointment. Always ask the nursing staff to show you in your  prescription , when your medication will be running out. Then arrange for the receptionist to get you a return appointment, at least 7 days before you run out of medicine. Do not wait until you have 1 or 2 pills left, to come in. This is very poor planning and does not take into consideration that we may need to cancel appointments due to bad weather, sickness, or emergencies affecting our staff. DO NOT ACCEPT A "Partial Fill": If for any reason your pharmacy does not have enough pills/tablets to completely fill or refill your prescription, do not allow for a "partial fill". The law allows the pharmacy to complete that prescription within 72 hours, without requiring a new prescription. If they do not fill the rest of your prescription within those 72 hours, you will need a separate prescription to fill the remaining amount, which we will NOT provide. If the reason for the partial fill is your insurance, you will need to talk to the pharmacist about payment alternatives for the remaining tablets, but again, DO NOT ACCEPT A PARTIAL FILL, unless you can trust your pharmacist to obtain the remainder of the pills within 72 hours.  Prescription refills and/or changes in medication(s):  Prescription refills, and/or changes in dose or medication, will be conducted only during scheduled medication management appointments. (Applies to both, written and electronic prescriptions.) No refills on procedure days. No medication will be changed or started on procedure days. No changes, adjustments, and/or refills will be conducted on a procedure day. Doing so will interfere with the diagnostic portion of the procedure. No phone refills. No medications will  be "called into the pharmacy". No Fax refills. No weekend refills. No Holliday refills. No after hours refills.  Remember:  Business hours are:  Monday to Thursday 8:00 AM to 4:00 PM Provider's Schedule: Milinda Pointer, MD - Appointments are:  Medication  management: Monday and Wednesday 8:00 AM to 4:00 PM Procedure day: Tuesday and Thursday 7:30 AM to 4:00 PM Gillis Santa, MD - Appointments are:  Medication management: Tuesday and Thursday 8:00 AM to 4:00 PM Procedure day: Monday and Wednesday 7:30 AM to 4:00 PM (Last update: 01/13/2022) ______________________________________________________________________    ____________________________________________________________________________________________  Drug Holidays  What is a "Drug Holiday"? Drug Holiday: is the name given to the process of slowly tapering down and temporarily stopping the pain medication for the purpose of decreasing or eliminating tolerance to the drug.  Benefits Improved effectiveness Decreased required effective dose Improved pain control End dependence on high dose therapy Decrease cost of therapy Uncovering "opioid-induced hyperalgesia". (OIH)  What is "opioid hyperalgesia"? It is a paradoxical increase in pain caused by exposure to opioids. Stopping the opioid pain medication, contrary to the expected, it actually decreases or completely eliminates the pain. Ref.: "A comprehensive review of opioid-induced hyperalgesia". Brion Aliment, et.al. Pain Physician. 2011 Mar-Apr;14(2):145-61.  What is tolerance? Tolerance: the progressive loss of effectiveness of a pain medicine due to repetitive use. A common problem of opioid pain medications.  How long should a "Drug Holiday" last? Effectiveness depends on the patient staying off all opioid pain medicines for a minimum of 14 consecutive days. (2 weeks)  How about just taking less of the medicine? Does not work. Will not accomplish goal of eliminating the excess receptors.  How about switching to a different pain medicine? (AKA. "Opioid rotation") Does not work. Creates the illusion of effectiveness by taking advantage of inaccurate equivalent dose calculations between different opioids. -This "technique" was  promoted by studies funded by [employer], such as [employer], creators of "OxyContin".  Can I stop the medicine "cold [place]"? Depends. You should always coordinate with your Pain Specialist to make the transition as smoothly as possible. Avoid stopping the medicine abruptly without consulting. We recommend a "slow taper".  What is a slow taper? Taper: refers to the gradual decrease in dose.   How do I stop/taper the dose? Slowly. Decrease the daily amount of pills that you take by one (1) pill every seven (7) days. This is called a "slow downward taper". Example: if you normally take four (4) pills per day, drop it to three (3) pills per day for seven (7) days, then to two (2) pills per day for seven (7) days, then to one (1) per day for seven (7) days, and then stop the medicine. The 14 day "Drug Holiday" starts on the first day without medicine.   Will I experience withdrawals? Unlikely with a slow taper.  What triggers withdrawals? Withdrawals are triggered by the sudden/abrupt stop of high dose opioids. Withdrawals can be avoided by slowly decreasing the dose over a prolonged period of time.  What are withdrawals? Symptoms associated with sudden/abrupt reduction/stopping of high-dose, long-term use of pain medication. Withdrawal are seldom seen on low dose therapy, or patients rarely taking opioid medication.  Early Withdrawal Symptoms may include: Agitation Anxiety Muscle aches Increased tearing Insomnia Runny nose Sweating Yawning  Late symptoms may include: Abdominal cramping Diarrhea Dilated pupils Goose bumps Nausea Vomiting  (Last update: 03/01/2022) ____________________________________________________________________________________________    ____________________________________________________________________________________________  WARNING: CBD (cannabidiol) & Delta (Delta-8 tetrahydrocannabinol) products.   Applicable  to:  All  individuals currently taking or considering taking CBD (cannabidiol) and, more important, all patients taking opioid analgesic controlled substances (pain medication). (Example: oxycodone; oxymorphone; hydrocodone; hydromorphone; morphine; methadone; tramadol; tapentadol; fentanyl; buprenorphine; butorphanol; dextromethorphan; meperidine; codeine; etc.)  Introduction:  Recently there has been a drive towards the use of "natural" products for the treatment of different conditions, including pain anxiety and sleep disorders. Marijuana and hemp are two varieties of the cannabis genus plants. Marijuana and its derivatives are illegal, while hemp and its derivatives are not. Cannabidiol (CBD) and tetrahydrocannabinol (THC), are two natural compounds found in plants of the Cannabis genus. They can both be extracted from hemp or marijuana. Both compounds interact with your body's endocannabinoid system in very different ways. CBD is associated with pain relief (analgesia) while THC is associated with the psychoactive effects ("the high") obtained from the use of marijuana products. There are two main types of THC: Delta-9, which comes from the marijuana plant and it is illegal, and Delta-8, which comes from the hemp plant, and it is legal. (Both, Delta-9-THC and Delta-8-THC are psychoactive and give you "the high".)   Legality:  Marijuana and its derivatives: illegal Hemp and its derivatives: Legal (State dependent) UPDATE: (05/09/2021) The Drug Enforcement Agency ([place]) issued a letter stating that "delta" cannabinoids, including Delta-8-THCO and Delta-9-THCO, synthetically derived from hemp do not qualify as hemp and will be viewed as Schedule I drugs. (Schedule I drugs, substances, or chemicals are defined as drugs with no currently accepted medical use and a high potential for abuse. Some examples of Schedule I drugs are: heroin, lysergic acid diethylamide (LSD), marijuana (cannabis),  3,4-methylenedioxymethamphetamine (ecstasy), methaqualone, and peyote.) (https://jennings.com/)  Legal status of CBD in [place]:  "Conditionally Legal"  Reference: "FDA Regulation of Cannabis and Cannabis-Derived Products, Including Cannabidiol (CBD)" - Se[employer].com.pt  Warning:  CBD is not FDA approved and has not undergo the same manufacturing controls as prescription drugs.  This means that the purity and safety of available CBD may be questionable. Most of the time, despite manufacturer's claims, it is contaminated with THC (delta-9-tetrahydrocannabinol - the chemical in marijuana responsible for the "HIGH").  When this is the case, the [facility] contaminant will trigger a positive urine drug screen (UDS) test for Marijuana (carboxy-THC).   The FDA recently put out a warning about 5 things that everyone should be aware of regarding Delta-8 THC: Delta-8 THC products have not been evaluated or approved by the FDA for safe use and may be marketed in ways that put the public health at risk. The FDA has received adverse event reports involving delta-8 THC-containing products. Delta-8 THC has psychoactive and intoxicating effects. Delta-8 THC manufacturing often involve use of potentially harmful chemicals to create the concentrations of delta-8 THC claimed in the marketplace. The final delta-8 THC product may have potentially harmful by-products (contaminants) due to the chemicals used in the process. Manufacturing of delta-8 THC products may occur in uncontrolled or unsanitary settings, which may lead to the presence of unsafe contaminants or other potentially harmful substances. Delta-8 THC products should be kept out of the reach of children and pets.  NOTE: Because a positive UDS for any illicit substance is a violation of our medication agreement, your opioid analgesics (pain medicine) may be  permanently discontinued.  MORE ABOUT CBD  General Information: CBD was discovered in [birth year] and it is a derivative of the cannabis sativa genus plants (Marijuana and Hemp). It is one of the 113 identified substances found in Marijuana. It accounts  for up to 40% of the plant's extract. As of 2018, preliminary clinical studies on CBD included research for the treatment of anxiety, movement disorders, and pain. CBD is available and consumed in multiple forms, including inhalation of smoke or vapor, as an aerosol spray, and by mouth. It may be supplied as an oil containing CBD, capsules, dried cannabis, or as a liquid solution. CBD is thought not to be as psychoactive as THC (delta-9-tetrahydrocannabinol - the chemical in marijuana responsible for the "HIGH"). Studies suggest that CBD may interact with different biological target receptors in the body, including cannabinoid and other neurotransmitter receptors. As of 2018 the mechanism of action for its biological effects has not been determined.  Side-effects  Adverse reactions: Dry mouth, diarrhea, decreased appetite, fatigue, drowsiness, malaise, weakness, sleep disturbances, and others.  Drug interactions:  CBD may interact with medications such as blood-thinners. CBD causes drowsiness on its own and it will increase drowsiness caused by other medications, including antihistamines (such as Benadryl), benzodiazepines (Xanax, Ativan, Valium), antipsychotics, antidepressants, opioids, alcohol and supplements such as kava, melatonin and St. John's Wort.  Other drug interactions: Brivaracetam (Briviact); Caffeine; Carbamazepine (Tegretol); Citalopram (Celexa); Clobazam (Onfi); Eslicarbazepine (Aptiom); Everolimus (Zostress); Lithium; Methadone (Dolophine); Rufinamide (Banzel); Sedative medications (CNS depressants); Sirolimus (Rapamune); Stiripentol (Diacomit); Tacrolimus (Prograf); Tamoxifen ; Soltamox); Topiramate (Topamax); Valproate; Warfarin (Coumadin);  Zonisamide. (Last update: 03/02/2022) ____________________________________________________________________________________________   ____________________________________________________________________________________________  Naloxone Nasal Spray  Why am I receiving this medication? [place] STOP ACT requires that all patients taking high dose opioids or at risk of opioids respiratory depression, be prescribed an opioid reversal agent, such as Naloxone (AKA: Narcan).  What is this medication? NALOXONE (nal OX one) treats opioid overdose, which causes slow or shallow breathing, severe drowsiness, or trouble staying awake. Call emergency services after using this medication. You may need additional treatment. Naloxone works by reversing the effects of opioids. It belongs to a group of medications called opioid blockers.  COMMON BRAND NAME(S): Kloxxado, Narcan  What should I tell my care team before I take this medication? They need to know if you have any of these conditions: Heart disease Substance use disorder An unusual or allergic reaction to naloxone, other medications, foods, dyes, or preservatives Pregnant or trying to get pregnant Breast-feeding  When to use this medication? This medication is to be used for the treatment of respiratory depression (less than 8 breaths per minute) secondary to opioid overdose.   How to use this medication? This medication is for use in the nose. Lay the person on their back. Support their neck with your hand and allow the head to tilt back before giving the medication. The nasal spray should be given into 1 nostril. After giving the medication, move the person onto their side. Do not remove or test the nasal spray until ready to use. Get emergency medical help right away after giving the first dose of this medication, even if the person wakes up. You should be familiar with how to recognize the signs and symptoms of a narcotic overdose. If  more doses are needed, give the additional dose in the other nostril. Talk to your care team about the use of this medication in children. While this medication may be prescribed for children as young as newborns for selected conditions, precautions do apply.  Naloxone Overdosage: If you think you have taken too much of this medicine contact a poison control center or emergency room at once.  NOTE: This medicine is only for you. Do not share  this medicine with others.  What if I miss a dose? This does not apply.  What may interact with this medication? This is only used during an emergency. No interactions are expected during emergency use. This list may not describe all possible interactions. Give your health care provider a list of all the medicines, herbs, non-prescription drugs, or dietary supplements you use. Also tell them if you smoke, drink alcohol, or use illegal drugs. Some items may interact with your medicine.  What should I watch for while using this medication? Keep this medication ready for use in the case of an opioid overdose. Make sure that you have the phone number of your care team and local hospital ready. You may need to have additional doses of this medication. Each nasal spray contains a single dose. Some emergencies may require additional doses. After use, bring the treated person to the nearest hospital or call 911. Make sure the treating care team knows that the person has received a dose of this medication. You will receive additional instructions on what to do during and after use of this medication before an emergency occurs.  What side effects may I notice from receiving this medication? Side effects that you should report to your care team as soon as possible: Allergic reactions--skin rash, itching, hives, swelling of the face, lips, tongue, or throat Side effects that usually do not require medical attention (report these to your care team if they continue or are  bothersome): Constipation Dryness or irritation inside the nose Headache Increase in blood pressure Muscle spasms Stuffy nose Toothache This list may not describe all possible side effects. Call your doctor for medical advice about side effects. You may report side effects to FDA at 1-800-FDA-1088.  Where should I keep my medication? Because this is an emergency medication, you should keep it with you at all times.  Keep out of the reach of children and pets. Store between 20 and 25 degrees C (68 and 77 degrees F). Do not freeze. Throw away any unused medication after the expiration date. Keep in original box until ready to use.  NOTE: This sheet is a summary. It may not cover all possible information. If you have questions about this medicine, talk to your doctor, pharmacist, or health care provider.   2023 Elsevier/Gold Standard (2020-11-15 00:00:00)  ____________________________________________________________________________________________   _________________________________________________________________________________________  Body mass index (BMI) Weight Management Required  URGENT: Dear [name] your weight has been found to be adversely affecting your health. Aggressive action is immediately required. Talk to your primary care physician.  Body mass index (BMI) is a common tool for deciding whether a person has an appropriate body weight.  It measures a persons weight in relation to their height.   According to the [facility] of health (NIH): A BMI of less than 18.5 means that a person is underweight. A BMI of between 18.5 and 24.9 is ideal. A BMI of between 25 and 29.9 is overweight. A BMI over 30 indicates obesity.  Body Mass Index (BMI) Classification BMI level (kg/m2) Category Associated incidence of chronic pain  <18  Underweight   18.5-24.9 Ideal body weight   25-29.9 Overweight  20%  30-34.9 Obese (Class I)  68%  35-39.9 Severe obesity (Class II)   136%  >40 Extreme obesity (Class III)  254%   Your current Estimated body mass index is 46.64 kg/m as calculated from the following:   Height as of 03/26/22: 5' 5.5" (1.664 m).   Weight as  of 03/26/22: 284 lb 9.6 oz (129.1 kg).  Morbidly Obese Classification: You will be considered to be "Morbidly Obese" if your BMI is above 30 and you have one or more of the following conditions caused or associated to obesity: 1.    Type 2 Diabetes (Leading to cardiovascular diseases (CVD), stroke, peripheral vascular diseases (PVD), retinopathy, nephropathy, and neuropathy) 2.    Cardiovascular Disease (High Blood Pressure; Congestive Heart Failure; High Cholesterol; Coronary Artery Disease; Angina; Arrhythmias, Dysrhythmias, or Heart Attacks) 3.    Breathing problems (Asthma; obesity-hypoventilation syndrome; obstructive sleep apnea; chronic inflammatory airway disease; reactive airway disease; or shortness of breath) 4.    Chronic kidney disease 5.    Liver disease (nonalcoholic fatty liver disease) 6.    High blood pressure 7.    Acid reflux (gastroesophageal reflux disease; heartburn) 8.    Osteoarthritis (OA) (affecting the hip(s), the knee(s) and/or the lower back) 9.    Low back pain (Lumbar Facet Syndrome; and/or Degenerative Disc Disease) 10.  Hip pain (Osteoarthritis of hip) (For every 1 lbs of added body weight, there is a 2 lbs increase in pressure inside of each hip articulation. 1:2 mechanical relationship) 11.  Knee pain (Osteoarthritis of knee) (For every 1 lbs of added body weight, there is a 4 lbs increase in pressure inside of each knee articulation. 1:4 mechanical relationship) (patients with a BMI>30 kg/m2 were 6.8 times more likely to develop knee OA than normal-weight individuals) 12.  Cancer: Epidemiological studies have shown that obesity is a risk factor for: post-menopausal breast cancer; cancers of the endometrium, colon and kidney cancer; malignant adenomas of the oesophagus. Obese  subjects have an approximately 1.5-3.5-fold increased risk of developing these cancers compared with normal-weight subjects, and it has been estimated that between 15 and 45% of these cancers can be attributed to overweight. More recent studies suggest that obesity may also increase the risk of other types of cancer, including pancreatic, hepatic and gallbladder cancer. (Ref: Obesity and cancer. Pischon T, Nthlings U, [employer] H. Proc Nutr Soc. 2008 May;67(2):128-45. doi: N3840775.) The International Agency for Research on Cancer (IARC) has identified 13 cancers associated with overweight and obesity: meningioma, multiple myeloma, adenocarcinoma of the esophagus, and cancers of the thyroid, postmenopausal breast cancer, gallbladder, stomach, liver, pancreas, kidney, ovaries, uterus, colon and rectal (colorectal) cancers. 11 percent of all cancers diagnosed in women and 24 percent of those diagnosed in men are associated with overweight and obesity.  Recommendation: At this point it is urgent that you take a step back and concentrate in loosing weight. Dedicate 100% of your efforts on this task. Nothing else will improve your health more than bringing your weight down and your BMI to less than 30. If you are here, you probably have chronic pain. We know that most chronic pain patients have difficulty exercising secondary to their pain. For this reason, you must rely on proper nutrition and diet in order to lose the weight. If your BMI is above 40, you should seriously consider bariatric surgery. A realistic goal is to lose 10% of your body weight over a period of 12 months.  Be honest to yourself, if over time you have unsuccessfully tried to lose weight, then it is time for you to seek professional help and to enter a medically supervised weight management program, and/or undergo bariatric surgery. Stop procrastinating.   Pain management considerations:  1.    Pharmacological Problems: Be  advised that the use of opioid analgesics (oxycodone; hydrocodone; morphine; methadone;  codeine; and all of their derivatives) have been associated with decreased metabolism and weight gain.  For this reason, should we see that you are unable to lose weight while taking these medications, it may become necessary for [place] to taper down and indefinitely discontinue them.  2.    Technical Problems: The incidence of successful interventional therapies decreases as the patient's BMI increases. It is much more difficult to accomplish a safe and effective interventional therapy on a patient with a BMI above 35. 3.    Radiation Exposure Problems: The x-rays machine, used to accomplish injection therapies, will automatically increase their x-ray output in order to capture an appropriate bone image. This means that radiation exposure increases exponentially with the patient's BMI. (The higher the BMI, the higher the radiation exposure.) Although the level of radiation used at a given time is still safe to the patient, it is not for the physician and/or assisting staff. Unfortunately, radiation exposure is accumulative. Because physicians and the staff have to do procedures and be exposed on a daily basis, this can result in health problems such as cancer and radiation burns. Radiation exposure to the staff is monitored by the radiation batches that they wear. The exposure levels are reported back to the staff on a quarterly basis. Depending on levels of exposure, physicians and staff may be obligated by law to decrease this exposure. This means that they have the right and obligation to refuse providing therapies where they may be overexposed to radiation. For this reason, physicians may decline to offer therapies such as radiofrequency ablation or implants to patients with a BMI above 40. 4.    Current Trends: Be advised that the current trend is to no longer offer certain therapies to patients with a BMI equal to, or above  35, due to increase perioperative risks, increased technical procedural difficulties, and excessive radiation exposure to healthcare personnel.  _________________________________________________________________________________________

## 2022-05-28 ENCOUNTER — Telehealth: Payer: Self-pay | Admitting: Nurse Practitioner

## 2022-05-28 NOTE — Telephone Encounter (Signed)
Pt was informed and she hadn't checked her sugar since then she stated she will check it soon she gets out the shower. I advised pt to start writing/keeping a log of her sugars and that if they remain high that she will need to be seen a lot sooner than the 22nd. Pt verbalized understanding.

## 2022-05-28 NOTE — Telephone Encounter (Signed)
Pt called in staying that she's having surgery in April, however she's concern about her sugar been high @ 241. She has an apt with Wynetta Emery 3/22, she was wondering what she can do for now? Or take? I offered an appt to see provider tomorrow but she's unable to make it that early. She's available '@[phone].

## 2022-05-29 DIAGNOSIS — R69 Illness, unspecified: Secondary | ICD-10-CM | POA: Diagnosis not present

## 2022-05-29 DIAGNOSIS — G4700 Insomnia, unspecified: Secondary | ICD-10-CM | POA: Diagnosis not present

## 2022-05-29 DIAGNOSIS — R0681 Apnea, not elsewhere classified: Secondary | ICD-10-CM | POA: Diagnosis not present

## 2022-05-29 DIAGNOSIS — I10 Essential (primary) hypertension: Secondary | ICD-10-CM | POA: Diagnosis not present

## 2022-05-29 DIAGNOSIS — G4733 Obstructive sleep apnea (adult) (pediatric): Secondary | ICD-10-CM | POA: Diagnosis not present

## 2022-05-29 DIAGNOSIS — Z8673 Personal history of transient ischemic attack (TIA), and cerebral infarction without residual deficits: Secondary | ICD-10-CM | POA: Diagnosis not present

## 2022-06-03 ENCOUNTER — Other Ambulatory Visit: Payer: Self-pay | Admitting: Family Medicine

## 2022-06-09 DIAGNOSIS — E1165 Type 2 diabetes mellitus with hyperglycemia: Secondary | ICD-10-CM | POA: Diagnosis not present

## 2022-06-09 DIAGNOSIS — I272 Pulmonary hypertension, unspecified: Secondary | ICD-10-CM | POA: Diagnosis not present

## 2022-06-09 DIAGNOSIS — I4891 Unspecified atrial fibrillation: Secondary | ICD-10-CM | POA: Diagnosis not present

## 2022-06-09 DIAGNOSIS — G4733 Obstructive sleep apnea (adult) (pediatric): Secondary | ICD-10-CM | POA: Diagnosis not present

## 2022-06-09 DIAGNOSIS — I639 Cerebral infarction, unspecified: Secondary | ICD-10-CM | POA: Diagnosis not present

## 2022-06-10 ENCOUNTER — Telehealth: Payer: Self-pay | Admitting: Cardiovascular Disease

## 2022-06-10 ENCOUNTER — Ambulatory Visit: Payer: Medicare HMO | Admitting: Pulmonary Disease

## 2022-06-10 DIAGNOSIS — Z01812 Encounter for preprocedural laboratory examination: Secondary | ICD-10-CM | POA: Diagnosis not present

## 2022-06-10 DIAGNOSIS — R944 Abnormal results of kidney function studies: Secondary | ICD-10-CM | POA: Diagnosis not present

## 2022-06-10 DIAGNOSIS — R7309 Other abnormal glucose: Secondary | ICD-10-CM | POA: Diagnosis not present

## 2022-06-10 DIAGNOSIS — M19012 Primary osteoarthritis, left shoulder: Secondary | ICD-10-CM | POA: Diagnosis not present

## 2022-06-10 NOTE — Telephone Encounter (Signed)
See below

## 2022-06-10 NOTE — Telephone Encounter (Signed)
New Message:   [name] says she needs to verify the quantity of the patient's Lovenox please

## 2022-06-11 DIAGNOSIS — G4700 Insomnia, unspecified: Secondary | ICD-10-CM | POA: Diagnosis not present

## 2022-06-11 DIAGNOSIS — G4733 Obstructive sleep apnea (adult) (pediatric): Secondary | ICD-10-CM | POA: Diagnosis not present

## 2022-06-11 DIAGNOSIS — R0681 Apnea, not elsewhere classified: Secondary | ICD-10-CM | POA: Diagnosis not present

## 2022-06-11 DIAGNOSIS — I10 Essential (primary) hypertension: Secondary | ICD-10-CM | POA: Diagnosis not present

## 2022-06-11 DIAGNOSIS — R69 Illness, unspecified: Secondary | ICD-10-CM | POA: Diagnosis not present

## 2022-06-11 NOTE — Telephone Encounter (Signed)
She needs 5 syringes which = 4 ml.  Called pharmacy and clarified the quantity.

## 2022-06-12 ENCOUNTER — Ambulatory Visit (INDEPENDENT_AMBULATORY_CARE_PROVIDER_SITE_OTHER): Payer: Medicare HMO | Admitting: Nurse Practitioner

## 2022-06-12 ENCOUNTER — Encounter: Payer: Self-pay | Admitting: Nurse Practitioner

## 2022-06-12 VITALS — BP 120/74 | HR 70 | Temp 97.8°F | Ht 64.0 in | Wt 267.7 lb

## 2022-06-12 DIAGNOSIS — E1165 Type 2 diabetes mellitus with hyperglycemia: Secondary | ICD-10-CM

## 2022-06-12 LAB — GLUCOSE, POCT (MANUAL RESULT ENTRY): POC Glucose: 174 mg/dl — AB (ref 70–99)

## 2022-06-12 MED ORDER — EMPAGLIFLOZIN 10 MG PO TABS: 10.0000 mg | ORAL_TABLET | Freq: Every day | ORAL | 3 refills | Status: DC

## 2022-06-12 MED ORDER — TIRZEPATIDE 2.5 MG/0.5ML ~~LOC~~ SOAJ: 2.5000 mg | SUBCUTANEOUS | 0 refills | Status: DC

## 2022-06-12 MED ORDER — FREESTYLE LIBRE 3 SENSOR MISC: 11 refills | Status: DC

## 2022-06-12 MED ORDER — TIRZEPATIDE 5 MG/0.5ML ~~LOC~~ SOAJ: 5.0000 mg | SUBCUTANEOUS | 0 refills | Status: DC

## 2022-06-12 NOTE — Progress Notes (Unsigned)
Dawn Morrow, NP-C Phone: [phone]  [name] is a 72 y.o. adult who presents today to discuss her diabetes.   DIABETES Disease Monitoring: Blood Sugar ranges-*** Polyuria/phagia/dipsia- ***      Optho- *** Medications: Compliance- *** Hypoglycemic symptoms- *** Lab Results  Component Value Date   HGBA1C 7.7 (H) 11/27/2021    Social History   Tobacco Use  Smoking Status Never  Smokeless Tobacco Never    Current Outpatient Medications on File Prior to Visit  Medication Sig Dispense Refill   acetaminophen (TYLENOL) 325 MG tablet Take 2 tablets (650 mg total) by mouth every 6 (six) hours as needed for mild pain or moderate pain (or Fever >/= 101).     albuterol (PROVENTIL) (2.5 MG/3ML) 0.083% nebulizer solution Take 3 mLs (2.5 mg total) by nebulization every 4 (four) hours as needed for wheezing or shortness of breath. 75 mL 12   ALPRAZolam (XANAX) 0.25 MG tablet Take 1 tablet (0.25 mg total) by mouth 2 (two) times daily as needed for anxiety. See SIG change was taking 1/2 of 0.5 tablet bid prn changed to 0.25 bid prn. D/c 0.5 xanax 60 tablet 2   buPROPion (WELLBUTRIN XL) 150 MG 24 hr tablet Take 1 tablet (150 mg total) by mouth daily. 90 tablet 3   calcium-vitamin D (OSCAL WITH D) 500-200 MG-UNIT tablet Take 1 tablet by mouth 2 (two) times daily with a meal. 60 tablet 0   cholecalciferol (VITAMIN D) 25 MCG tablet Take 1 tablet (1,000 Units total) by mouth 2 (two) times daily with a meal. 30 tablet 2   clobetasol cream (TEMOVATE) [id number] % Apply 1 application topically 2 (two) times daily as needed (skin irritation).      colchicine 0.6 MG tablet Take 1 tablet (0.6 mg total) by mouth as needed. May repeat 1 pill in 1 hr. No more than 3 total pills 1.8 mg in 24 hours as needed gout flare 30 tablet 2   diclofenac Sodium (VOLTAREN) 1 % GEL Apply 2 g topically 3 (three) times daily. 100 g 2   doxycycline (VIBRA-TABS) 100 MG tablet Take 1 tablet (100 mg total) by mouth 2 (two) times  daily. 14 tablet 0   DULoxetine (CYMBALTA) 60 MG capsule Take 1 capsule (60 mg total) by mouth daily. 90 capsule 3   ELIQUIS 5 MG TABS tablet TAKE 1 TABLET BY MOUTH TWICE DAILY 180 tablet 1   enoxaparin (LOVENOX) 120 MG/0.8ML injection Inject 0.8 mLs (120 mg total) into the skin every 12 (twelve) hours. 4 mL 0   ergocalciferol (VITAMIN D2) 1.25 MG (50000 UT) capsule Take 50,000 Units by mouth once a week.     estradiol (ESTRACE) 0.1 MG/GM vaginal cream Apply 0.5mg  (pea-sized amount)  just inside the vaginal introitus with a finger-tip on Monday, Wednesday and Friday nights, 42.5 g 11   Fluticasone-Umeclidin-Vilant (TRELEGY ELLIPTA) 200-62.5-25 MCG/ACT AEPB Inhale 1 puff into the lungs daily. 60 each 0   folic acid (FOLVITE) 1 MG tablet Take 1 tablet (1 mg total) by mouth daily. 90 tablet 3   furosemide (LASIX) 40 MG tablet Take 1 tablet (40 mg total) by mouth as directed. Take 1 tablet daily. May take additional 1 tablet If weight gain of greater than 2 pounds in 24 hours or 5 pounds in one week. 90 tablet 3   GEMTESA 75 MG TABS TAKE 1 TABLET BY MOUTH DAILY 30 tablet 11   glucose blood (ONETOUCH ULTRA) test strip TEST TWICE DAILY AS DIRECTED 100 each  11   HYDROcodone-acetaminophen (NORCO/VICODIN) 5-325 MG tablet Take 1 tablet by mouth 2 (two) times daily as needed for severe pain. Must last 30 days 45 tablet 0   [START ON 07/02/2022] HYDROcodone-acetaminophen (NORCO/VICODIN) 5-325 MG tablet Take 1 tablet by mouth 2 (two) times daily as needed for severe pain. Must last 30 days 45 tablet 0   [START ON 08/01/2022] HYDROcodone-acetaminophen (NORCO/VICODIN) 5-325 MG tablet Take 1 tablet by mouth 2 (two) times daily as needed for severe pain. Must last 30 days 45 tablet 0   Lancets (ONETOUCH ULTRASOFT) lancets Use as instructed 100 each 12   levocetirizine (XYZAL) 5 MG tablet TAKE ONE TABLET BY MOUTH AT BEDTIME AS NEEDED FOR ALLERGIES 90 tablet 3   levothyroxine (SYNTHROID) 137 MCG tablet Take 1 tablet (137  mcg total) by mouth daily before breakfast. 30 minutes do not mix with vitamins or acid reflux medications D/c 150 mcg 90 tablet 3   losartan (COZAAR) 50 MG tablet TAKE 1 TABLET BY MOUTH DAILY 90 tablet 3   metoprolol succinate (TOPROL-XL) 25 MG 24 hr tablet Take 0.5 tablets (12.5 mg total) by mouth daily. 45 tablet 3   Misc. Devices (TUB TRANSFER BOARD) MISC 1 Device by Does not apply route as needed. 1 each 0   montelukast (SINGULAIR) 10 MG tablet Take 1 tablet (10 mg total) by mouth at bedtime. 90 tablet 3   Multiple Vitamin (MULTIVITAMIN WITH MINERALS) TABS tablet Take 1 tablet by mouth daily.     mupirocin ointment (BACTROBAN) 2 % APPLY TOPICALLY TWICE DAILY TO RIGHT LOWER LEG AS DIRECTED. 22 g 0   naloxone (NARCAN) nasal spray 4 mg/0.1 mL Place 1 spray into the nose as needed for up to 365 doses (for opioid-induced respiratory depresssion). In case of emergency (overdose), spray once into each nostril. If no response within 3 minutes, repeat application and call [phone]. 1 each 0   nystatin cream (MYCOSTATIN) Apply 1 application. topically 2 (two) times daily. Angles of mouth or skin folds prn 30 g 11   ondansetron (ZOFRAN) 4 MG tablet Take 1 tablet (4 mg total) by mouth every 8 (eight) hours as needed for nausea or vomiting. 90 tablet 1   pantoprazole (PROTONIX) 40 MG tablet Take 1 tablet (40 mg total) by mouth daily. 30 min before food 90 tablet 3   Powders (GOLD BOND NO MESS BODY POWDER) AERP Apply 1 application. topically daily as needed. Gold bond talc free powder 200 g 11   pregabalin (LYRICA) 50 MG capsule TAKE 1 CAPSULE BY MOUTH 3 TIMES DAILY 90 capsule 2   rosuvastatin (CRESTOR) 20 MG tablet TAKE 1 TABLET BY MOUTH DAILY 90 tablet 1   sucralfate (CARAFATE) 1 GM/10ML suspension Take 1 g by mouth 3 (three) times daily.      thiamine 100 MG tablet Take 0.5 tablets (50 mg total) by mouth 2 (two) times daily with a meal. 60 tablet 2   No current facility-administered medications on file prior  to visit.    ROS see history of present illness  Objective  Physical Exam Vitals:   06/12/22 1535  BP: 120/74  Pulse: 70  Temp: 97.8 F (36.6 C)  SpO2: 95%    BP Readings from Last 3 Encounters:  06/12/22 120/74  05/25/22 117/77  03/26/22 138/82   Wt Readings from Last 3 Encounters:  06/12/22 267 lb 11.2 oz (121.4 kg)  05/25/22 284 lb (128.8 kg)  03/26/22 284 lb 9.6 oz (129.1 kg)    Physical Exam  Constitutional:      General: She is not in acute distress.    Appearance: Normal appearance.  HENT:     Head: Normocephalic.  Cardiovascular:     Rate and Rhythm: Normal rate and regular rhythm.     Heart sounds: Normal heart sounds.  Pulmonary:     Effort: Pulmonary effort is normal.     Breath sounds: Normal breath sounds.  Skin:    General: Skin is warm and dry.  Neurological:     General: No focal deficit present.     Mental Status: She is alert.  Psychiatric:        Mood and Affect: Mood normal.        Behavior: Behavior normal.    Assessment/Plan: Please see individual problem list.  Type 2 diabetes mellitus with hyperglycemia, without long-term current use of insulin (HCC) -     POCT glucose (manual entry) -     FreeStyle Libre 3 Sensor; Place 1 sensor on the skin every 14 days. Use to check glucose continuously  Dispense: 2 each; Refill: 11 -     Tirzepatide; Inject 2.5 mg into the skin once a week.  Dispense: 2 mL; Refill: 0 -     Tirzepatide; Inject 5 mg into the skin once a week.  Dispense: 2 mL; Refill: 0 -     Empagliflozin; Take 1 tablet (10 mg total) by mouth daily before breakfast.  Dispense: 90 tablet; Refill: 3   Return in about 4 weeks (around 07/10/2022) for Follow up.   Dawn Morrow, NP-C [place]

## 2022-06-15 DIAGNOSIS — H2513 Age-related nuclear cataract, bilateral: Secondary | ICD-10-CM | POA: Diagnosis not present

## 2022-06-15 DIAGNOSIS — E113213 Type 2 diabetes mellitus with mild nonproliferative diabetic retinopathy with macular edema, bilateral: Secondary | ICD-10-CM | POA: Diagnosis not present

## 2022-06-15 DIAGNOSIS — H524 Presbyopia: Secondary | ICD-10-CM | POA: Diagnosis not present

## 2022-06-15 DIAGNOSIS — H52223 Regular astigmatism, bilateral: Secondary | ICD-10-CM | POA: Diagnosis not present

## 2022-06-16 ENCOUNTER — Other Ambulatory Visit: Payer: Self-pay | Admitting: Obstetrics and Gynecology

## 2022-06-16 ENCOUNTER — Telehealth: Payer: Self-pay | Admitting: Nurse Practitioner

## 2022-06-16 ENCOUNTER — Other Ambulatory Visit: Payer: Self-pay | Admitting: Nurse Practitioner

## 2022-06-16 DIAGNOSIS — N390 Urinary tract infection, site not specified: Secondary | ICD-10-CM

## 2022-06-16 DIAGNOSIS — R82998 Other abnormal findings in urine: Secondary | ICD-10-CM

## 2022-06-16 DIAGNOSIS — E1165 Type 2 diabetes mellitus with hyperglycemia: Secondary | ICD-10-CM | POA: Insufficient documentation

## 2022-06-16 MED ORDER — FREESTYLE LIBRE 3 READER DEVI: 1.0000 | Freq: Every day | 0 refills | Status: DC

## 2022-06-16 NOTE — Telephone Encounter (Signed)
Pt called in staying that [facility] has some Mounjaro in stock. Note below from [place] was read to pt about the PA. Pt aware.

## 2022-06-16 NOTE — Assessment & Plan Note (Addendum)
Long discussion with patient about her diabetes, education provided and treatment options discussed at length. She does not want to be on insulin daily. Will D/C Ozempic and start on Mounjaro once weekly. Will also add Jardiance 10 mg daily. FreeStyle Libre sent in to help patient better monitor blood sugars. Advised to continue healthy diet. Will monitor closely. Advised to contact me if sugars continue to remain high.

## 2022-06-16 NOTE — Telephone Encounter (Signed)
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

## 2022-06-16 NOTE — Telephone Encounter (Signed)
Pt husband called stating [employer] has one box of mounjaro left and the pt need a PA on it

## 2022-06-17 NOTE — Telephone Encounter (Signed)
Medication Samples have been provided to the patient.  Drug name: [name]       Strength: 2.5 mg        Qty: 1 box (4 pens)  LOT: [id number]  Exp.Date: 12-12-23  Dosing instructions: INJECT 2.5 MG INTO THE SKIN ONCE A WEEK  The patient has been instructed regarding the correct time, dose, and frequency of taking this medication, including desired effects and most common side effects.   [name] 1:18 PM 06/17/2022     Pt has been informed and sample has been placed in the fridge with pts name on it.

## 2022-06-17 NOTE — Telephone Encounter (Signed)
Pt called wanting to see if we have any samples in the office because she is suppose to take her shot tomorrow and the PA has not came back yet

## 2022-06-18 ENCOUNTER — Ambulatory Visit: Payer: Medicare HMO | Admitting: Pulmonary Disease

## 2022-06-18 NOTE — Telephone Encounter (Signed)
Pt called stating the provider did not call in a reader for her Elenor Legato

## 2022-06-18 NOTE — Telephone Encounter (Signed)
Called husband back to see what his concerns or questions were before fwding to provider he did not answer LMOM to CB

## 2022-06-18 NOTE — Telephone Encounter (Signed)
Pt husband called wanting to speak to the provider regarding the pt [phone]

## 2022-06-19 ENCOUNTER — Other Ambulatory Visit (HOSPITAL_COMMUNITY): Payer: Self-pay

## 2022-06-19 ENCOUNTER — Encounter: Payer: Self-pay | Admitting: Oncology

## 2022-06-22 ENCOUNTER — Telehealth: Payer: Self-pay | Admitting: Nurse Practitioner

## 2022-06-22 DIAGNOSIS — B379 Candidiasis, unspecified: Secondary | ICD-10-CM

## 2022-06-22 MED ORDER — FLUCONAZOLE 150 MG PO TABS: 150.0000 mg | ORAL_TABLET | Freq: Once | ORAL | 0 refills | Status: AC

## 2022-06-22 NOTE — Telephone Encounter (Signed)
Pt called in staying that she has an UTI. I offered appts opening, pt preferred to have some meds sent over @ [facility] to treat that UTI.

## 2022-06-22 NOTE — Telephone Encounter (Signed)
[name] pool Call patient I have sent in [place] for suspected yeast infection. I reviewed [name] note Patient recently started on Jardiance.  Previous A1c 7.7 Please advise patient that she will need to make an appointment with PCP to determine if she were to stay on Jardiance most especially if yeast infections were to become frequent.  Please let her know that A1c is due as well.  [name]

## 2022-06-22 NOTE — Telephone Encounter (Signed)
Spoke to pt and informed her Diflucan has been sent in and she needs to f/up with PCP to see if she will remain on the Jardiance if yeast infections persist.

## 2022-06-23 IMAGING — CR DG TOE 2ND 2+V*L*
3 series · 3 of 3 positions shown · non-contrast
Comparison: December 19, 2019.

CLINICAL DATA: Left second toe gangrene.

EXAM:
LEFT SECOND TOE

[toe ap]
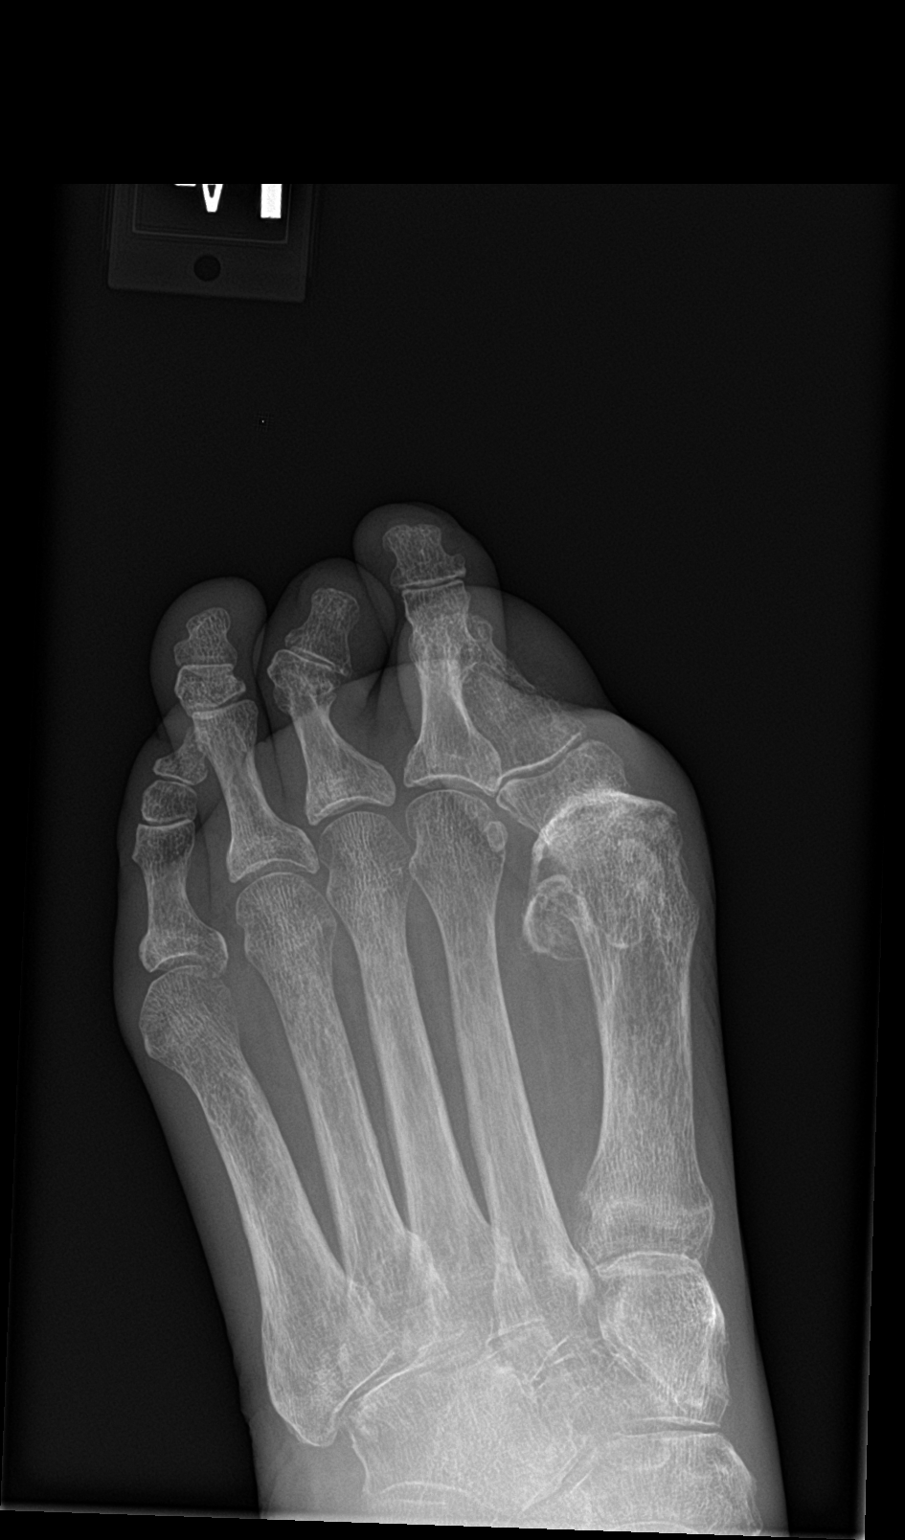

[toe obl]
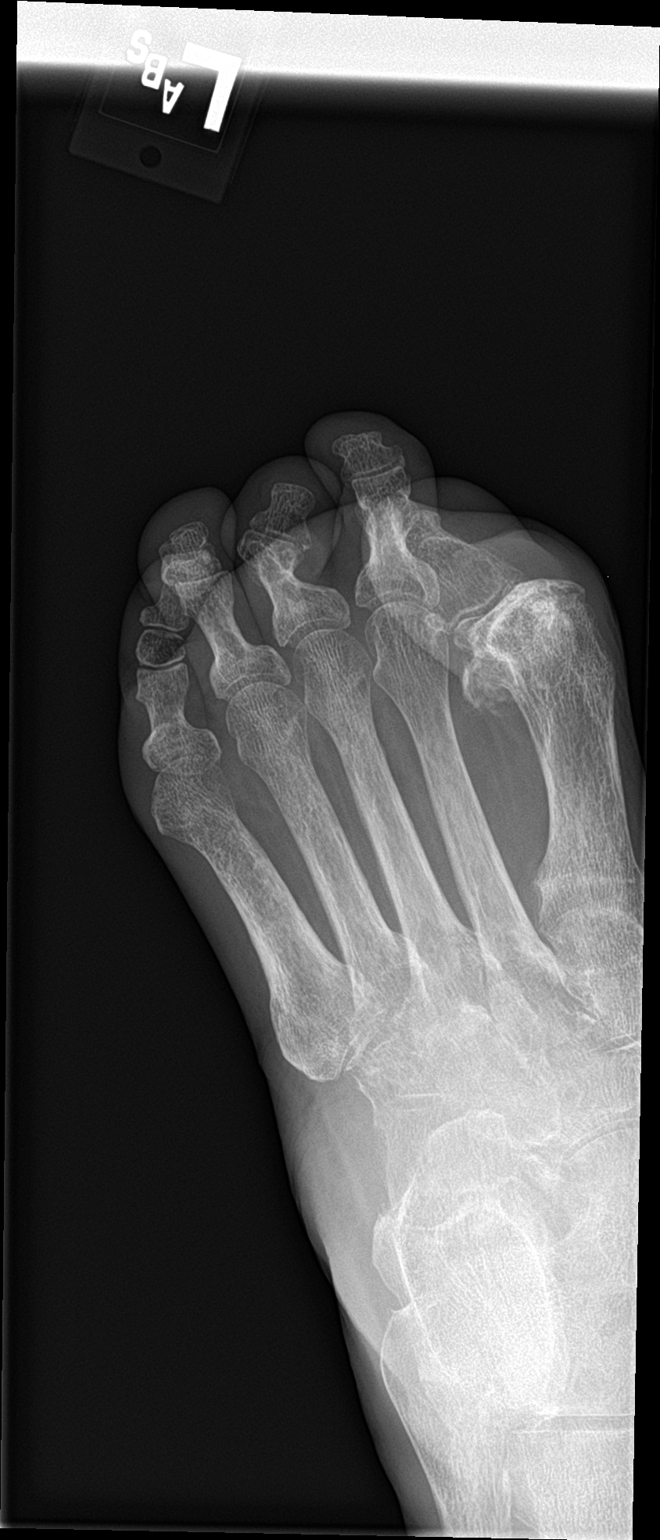

[toe lat]
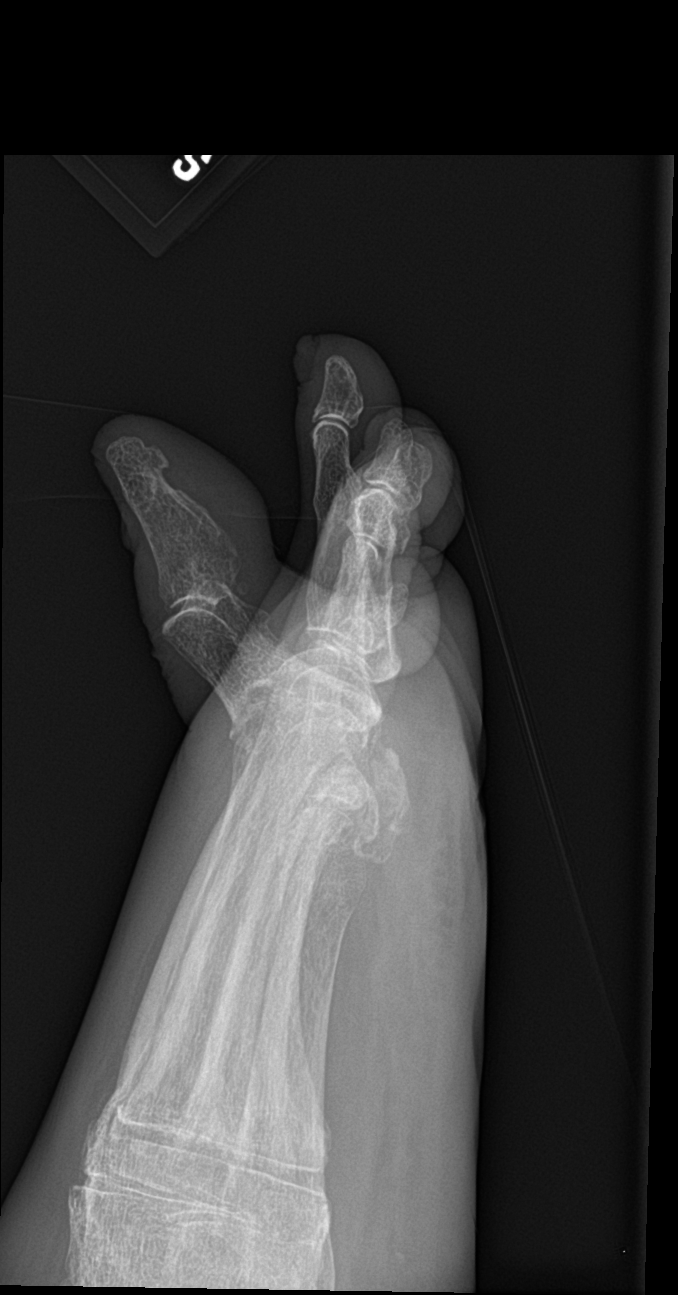

[3 of 3 positions shown; findings below may reference images not displayed]

FINDINGS: Stable old fractures are seen involving the second and third
proximal phalanges as well as probable severe degenerative changes
seen involving the first metatarsophalangeal joint. No acute
fracture or lytic destruction is noted. Soft tissues are
unremarkable.
IMPRESSION: No acute fracture or lytic destruction is noted to suggest
osteomyelitis. Stable old fractures are seen involving the second
and third proximal phalanges.

## 2022-06-23 IMAGING — MR MR HEAD W/O CM
12 series · 48 of 48 positions shown · non-contrast
Comparison: 02/05/2020

CLINICAL DATA: Stroke.  Bilateral leg weakness.

EXAM:
MRI HEAD WITHOUT CONTRAST
TECHNIQUE: Multiplanar, multiecho pulse sequences of the brain and surrounding
structures were obtained without intravenous contrast.

[Series 5: ax dwi_tracew · axial · 3.0mm · 0.71mm/px · z∈[-94,+51]mm · 3 of 50 slices shown]
[im 1/50]
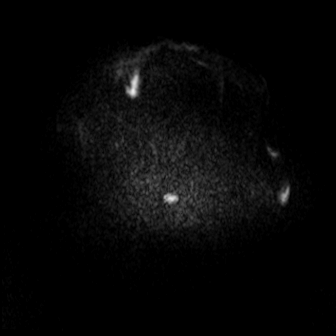
[im 25/50]
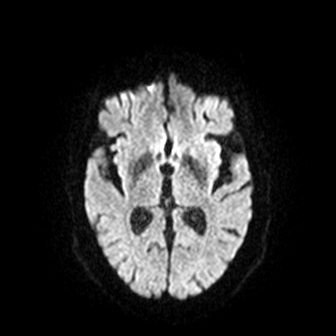
[im 50/50]
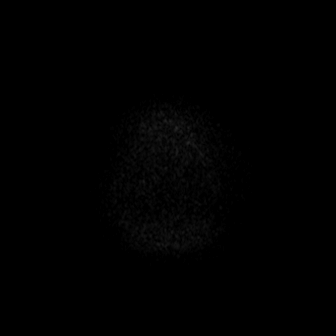

[Series 6: ax dwi_adc · axial · 3.0mm · 0.71mm/px · z∈[-94,+51]mm · 4 of 50 slices shown]
[im 1/50]
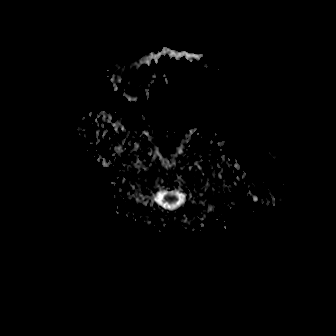
[im 17/50]
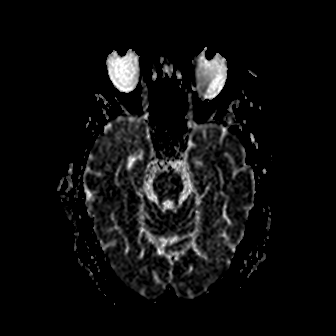
[im 33/50]
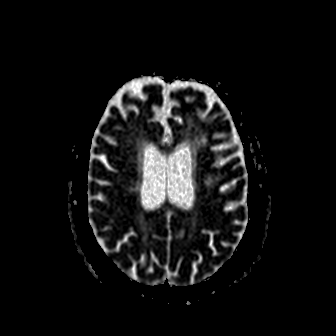
[im 50/50]
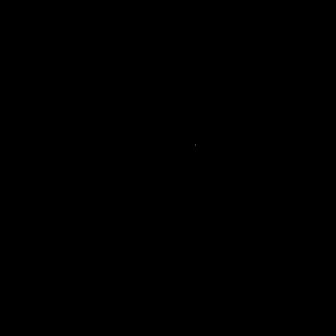

[Series 7: cor dwi_tracew · coronal · 5.0mm · 0.68mm/px · 3 of 36 slices shown]
[im 1/36]
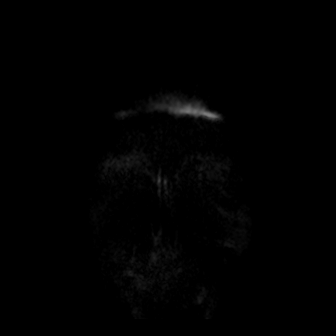
[im 18/36]
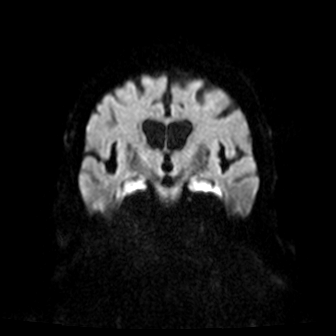
[im 36/36]
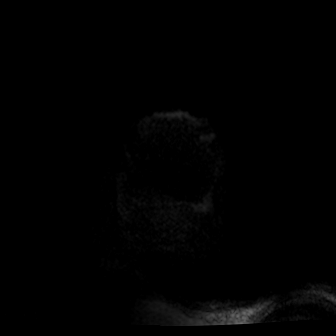

[Series 8: cor dwi_adc · coronal · 5.0mm · 0.68mm/px · 3 of 36 slices shown]
[im 1/36]
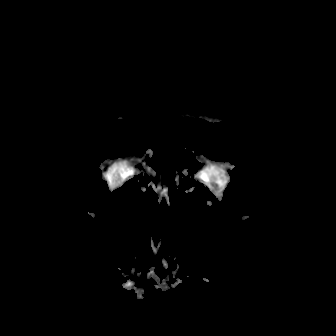
[im 18/36]
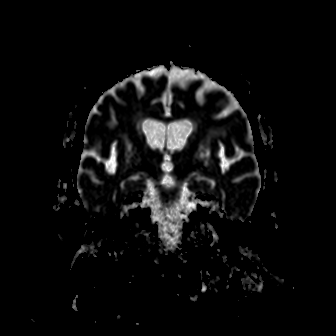
[im 36/36]
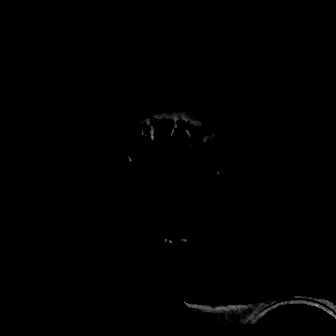

[Series 9: T1 · sagittal · 5.0mm · 0.62mm/px · 2 of 21 slices shown (1 of 2)]
[im 1/21]
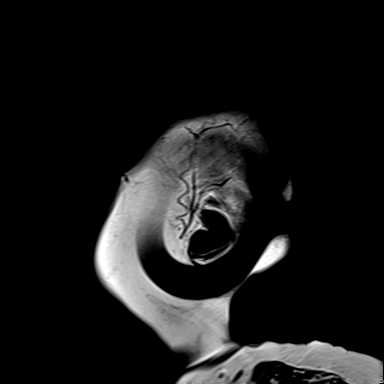
[im 21/21]
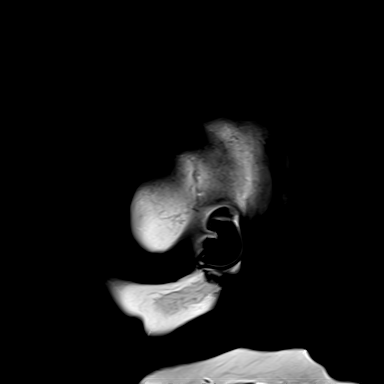

[Series 10: T2 · axial · 5.0mm · 0.53mm/px · z∈[-93,+50]mm · 2 of 25 slices shown (1 of 2)]
[im 1/25]
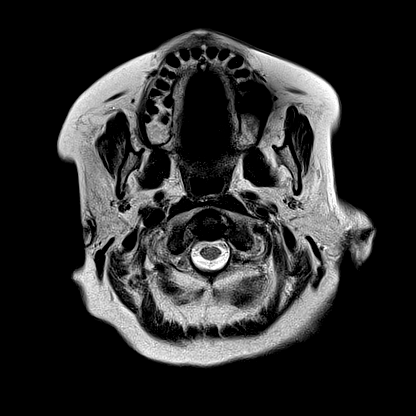
[im 25/25]
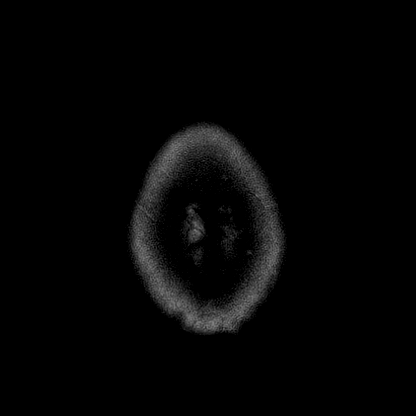

[Series 11: mag_images · axial · 3.0mm · 0.90mm/px · z∈[-108,+67]mm · 5 of 60 slices shown]
[im 1/60]
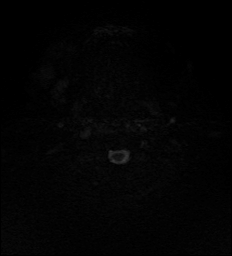
[im 15/60]
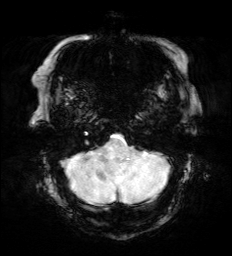
[im 30/60]
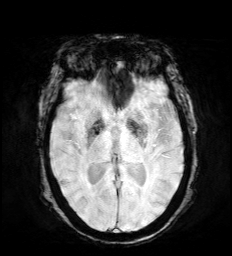
[im 45/60]
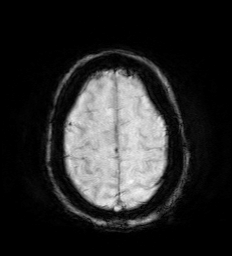
[im 60/60]
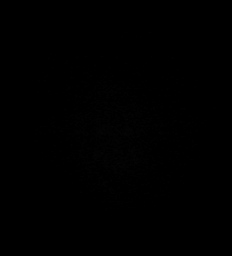

[Series 12: pha_images · axial · 3.0mm · 0.90mm/px · z∈[-108,+62]mm · 4 of 58 slices shown]
[im 1/58]
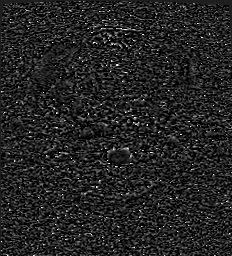
[im 20/58]
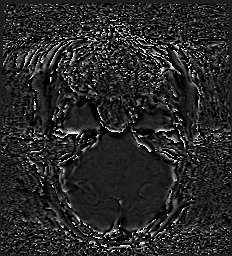
[im 39/58]
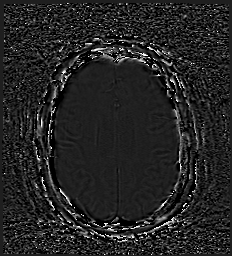
[im 58/58]
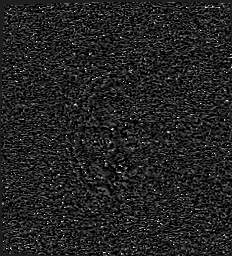

[Series 13: swi_images · axial · 3.0mm · 0.90mm/px · z∈[-108,+67]mm · 5 of 60 slices shown]
[im 1/60]
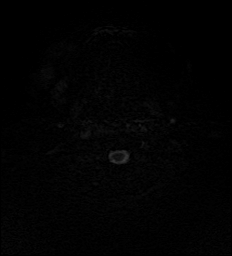
[im 15/60]
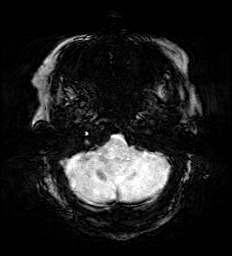
[im 30/60]
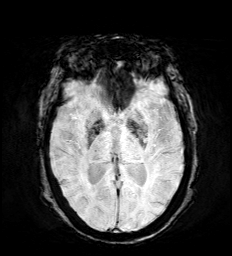
[im 45/60]
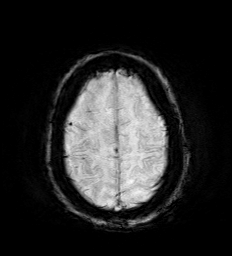
[im 60/60]
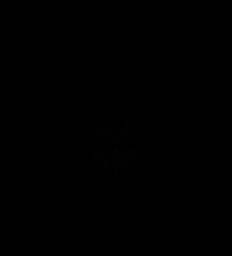

[Series 15: FLAIR · axial · 3.0mm · 0.53mm/px · z∈[-102,+59]mm · 4 of 55 slices shown]
[im 1/55]
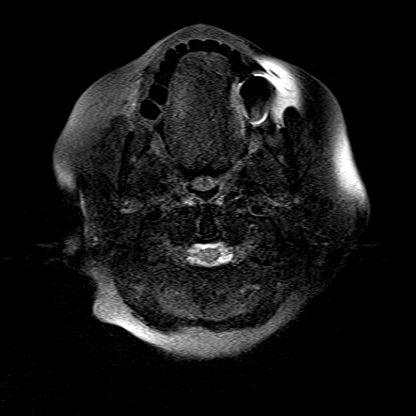
[im 19/55]
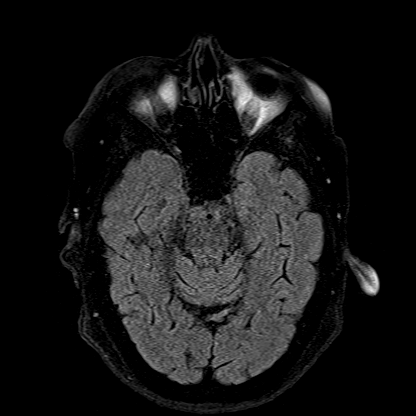
[im 37/55]
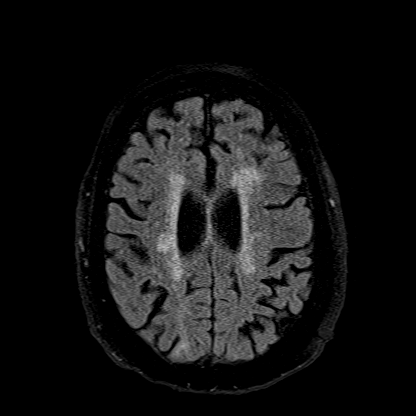
[im 55/55]
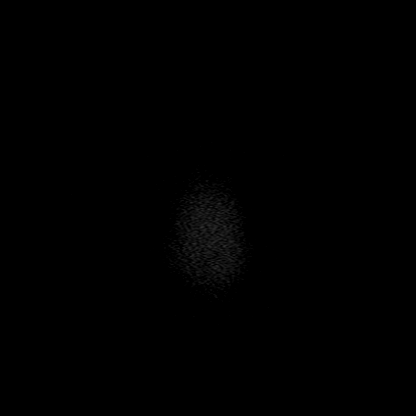

[Series 16: T1 · axial · 1.0mm · 0.98mm/px · z∈[-81,+60]mm · 11 of 144 slices shown (2 of 2)]
[im 1/144]
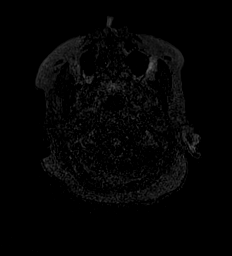
[im 15/144]
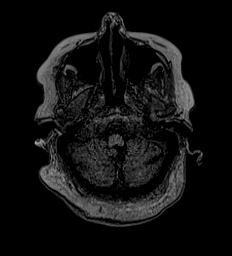
[im 29/144]
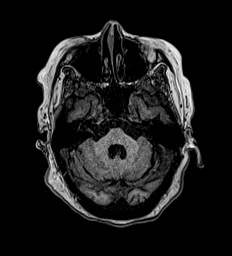
[im 43/144]
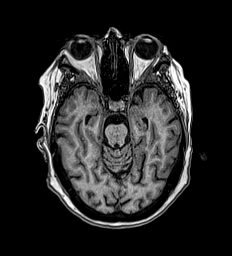
[im 58/144]
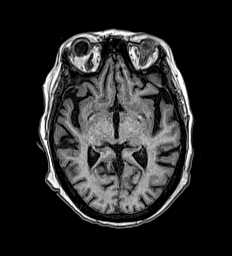
[im 72/144]
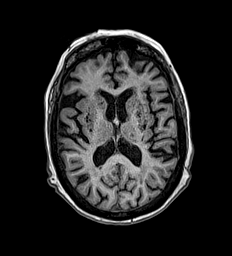
[im 86/144]
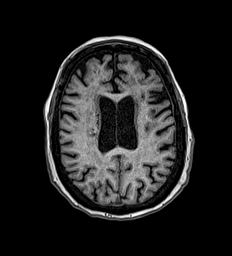
[im 101/144]
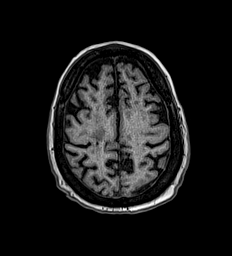
[im 115/144]
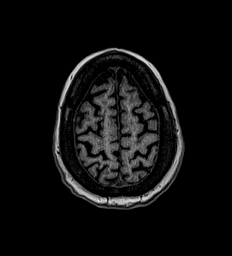
[im 129/144]
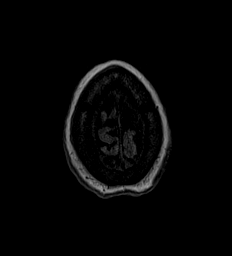
[im 144/144]
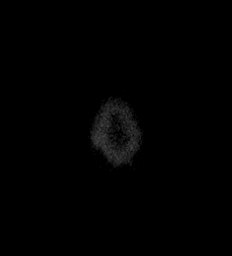

[Series 17: T2 · coronal · 5.0mm · 0.57mm/px · 2 of 27 slices shown (2 of 2)]
[im 1/27]
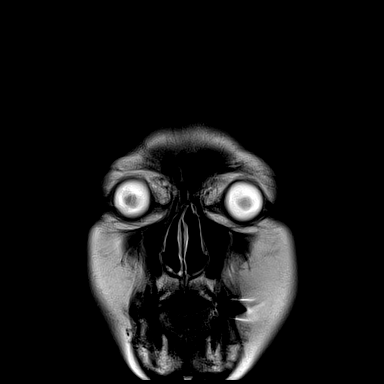
[im 27/27]
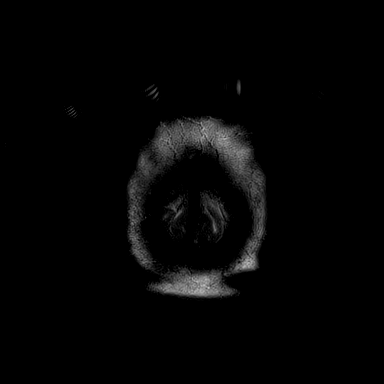

[48 of 48 positions shown; findings below may reference images not displayed]

FINDINGS: Brain: Diffusion imaging shows a punctate focus of restricted
diffusion in the right external capsule region consistent with acute
small vessel infarction. There are a few smudgy areas of restricted
diffusion in the right side of the splenium of the corpus callosum
that could also represent small vessel infarctions. The differential
diagnosis does include a demyelinating process. Elsewhere, chronic
abnormal T2 and FLAIR signal within the deep and subcortical
hemispheric white matter appears unchanged and most consistent with
chronic small vessel disease. Coexistence demyelinating disease is
not excluded. Right posterior parietal cortical and subcortical
infarction is unchanged. Numerous dilated perivascular spaces
throughout the basal ganglia regions. Few old small vessel insults
also affect the thalami and brainstem. Previously seen acute
infarctions have undergone expected evolutionary change with loss
restricted diffusion.

No evidence of mass, hemorrhage, hydrocephalus or extra-axial
collection.

Vascular: Major vessels at the base of the brain show flow.

Skull and upper cervical spine: Negative

Sinuses/Orbits: Clear/normal

Other: None
IMPRESSION: 1. Punctate focus of restricted diffusion in the right external
capsule region consistent with acute small vessel infarction. Few
smudgy areas of restricted diffusion in the right side of the
splenium of the corpus callosum that could also represent small
vessel infarctions. The differential diagnosis does include
demyelinating disease, but that is felt less likely.
2. Chronic small-vessel ischemic changes elsewhere throughout the
brain as outlined above.

## 2022-06-23 IMAGING — CR DG CHEST 2V
2 series · 2 of 2 positions shown · non-contrast
Comparison: 02/05/2020

CLINICAL DATA: Hypoxia.

EXAM:
CHEST - 2 VIEW

[chest ap]
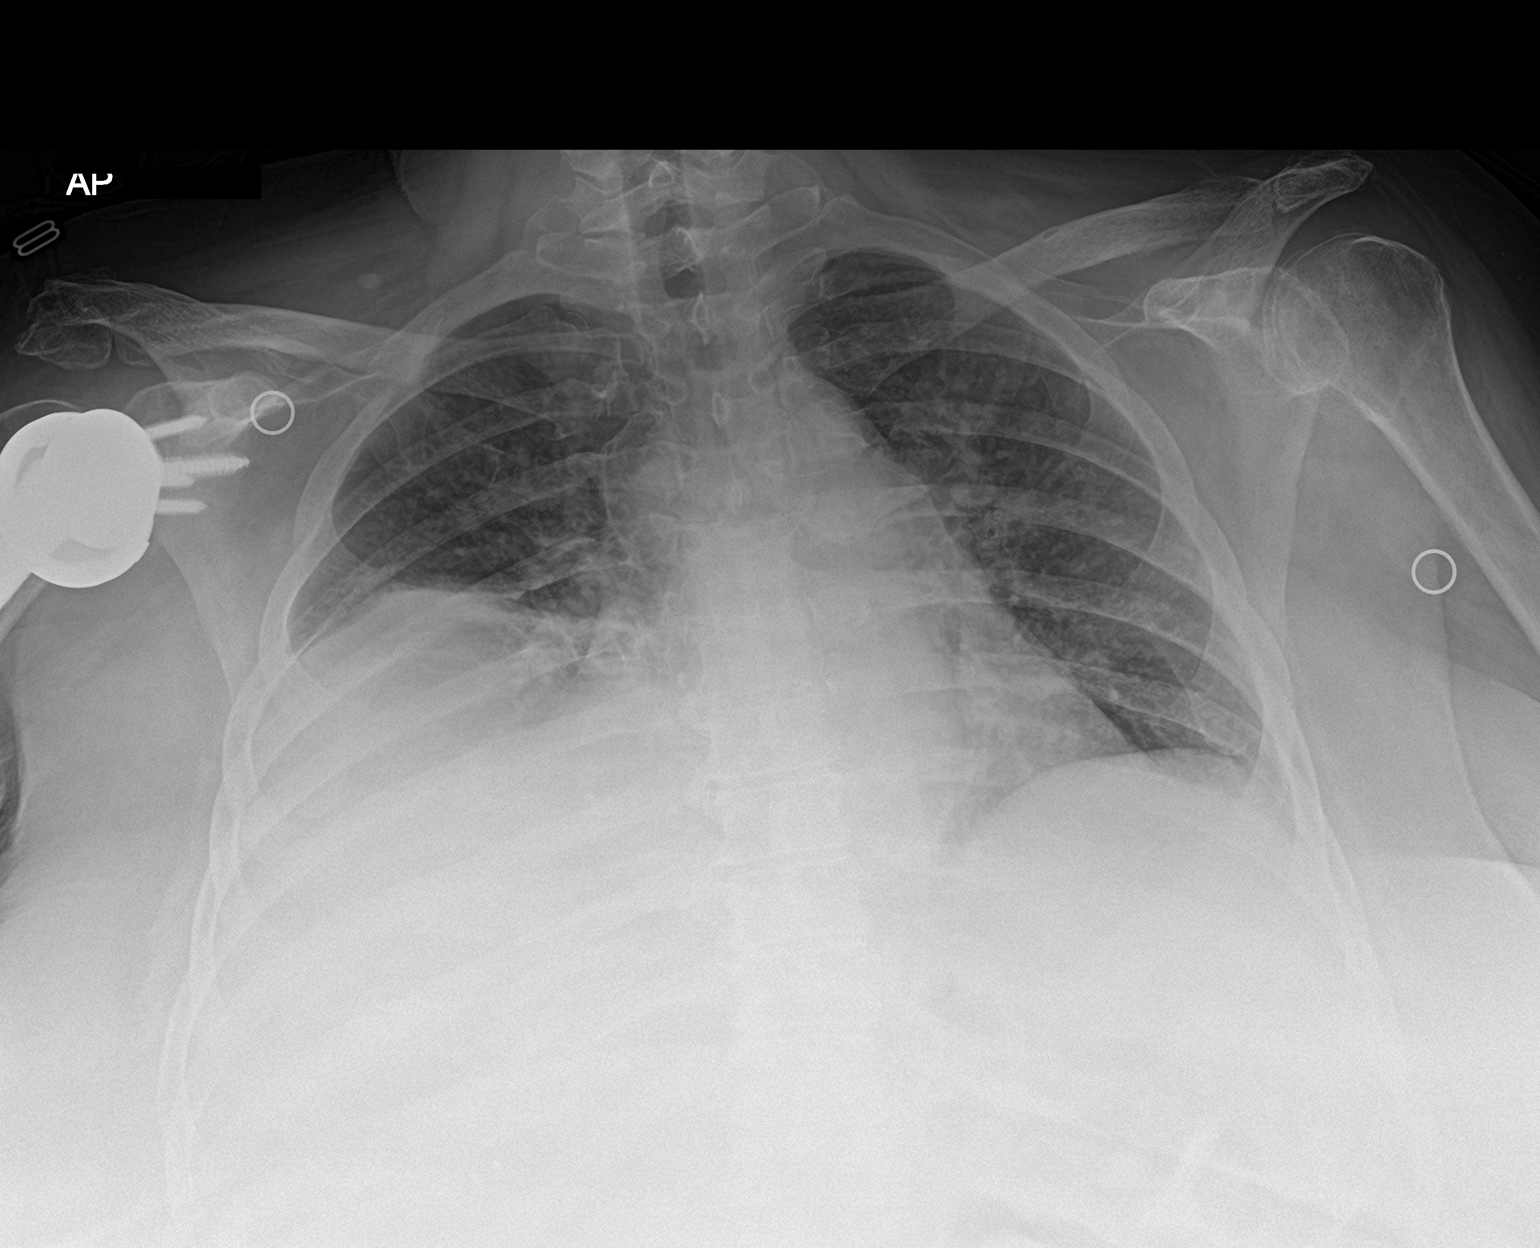

[chest lat]
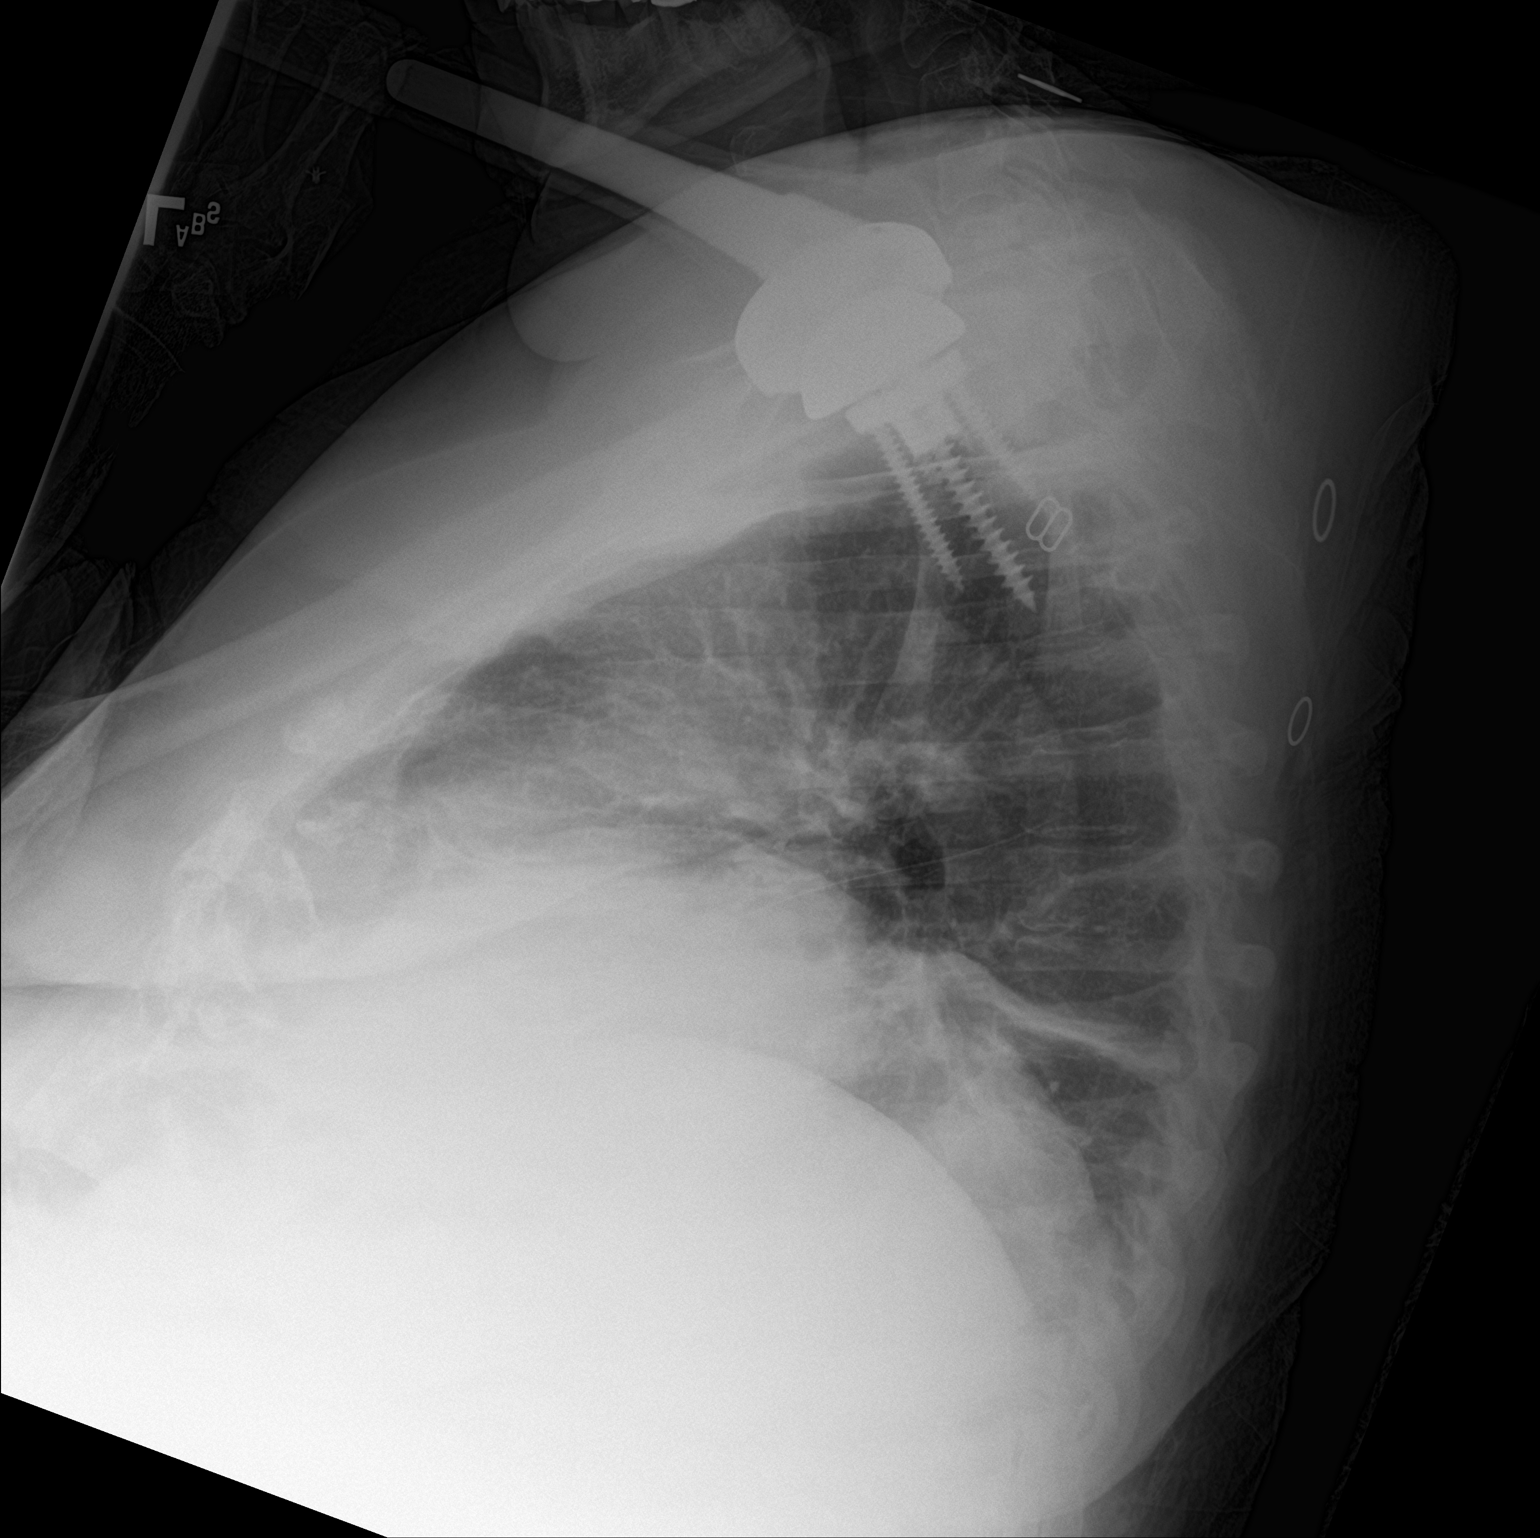

[2 of 2 positions shown; findings below may reference images not displayed]

FINDINGS: Heart size remains within normal limits. Low lung volumes are again
seen with stable elevation right hemidiaphragm and right lower lobe
scarring. No evidence of pulmonary consolidation or pleural
effusion. Right shoulder prosthesis again seen.
IMPRESSION: Stable low lung volumes and right lower lobe scarring. No acute
findings.

## 2022-06-24 ENCOUNTER — Other Ambulatory Visit: Payer: Medicare HMO

## 2022-06-24 ENCOUNTER — Ambulatory Visit: Payer: Medicare HMO | Admitting: Oncology

## 2022-06-25 ENCOUNTER — Other Ambulatory Visit: Payer: Self-pay | Admitting: Nurse Practitioner

## 2022-06-25 ENCOUNTER — Other Ambulatory Visit (HOSPITAL_COMMUNITY): Payer: Self-pay

## 2022-06-25 DIAGNOSIS — M797 Fibromyalgia: Secondary | ICD-10-CM

## 2022-06-25 DIAGNOSIS — E1142 Type 2 diabetes mellitus with diabetic polyneuropathy: Secondary | ICD-10-CM

## 2022-06-25 NOTE — Telephone Encounter (Signed)
Initiated PA for [facility] via [place]. Came back the PA is not needed    Ran test claim, received paid claim. Copay $0.00

## 2022-06-26 ENCOUNTER — Ambulatory Visit: Payer: Medicare HMO | Admitting: Nurse Practitioner

## 2022-06-26 IMAGING — MR MR MRA HEAD W/O CM
1 series · 19 of 48 positions shown · non-contrast
Comparison: Head MRA 12/22/2019.

CLINICAL DATA: 69-year-old female with lower extremity weakness,
right basal ganglia lacunar infarct and posterior corpus callosum,
periatrial infarcts on MRI 04/05/2020.

EXAM:
MRA HEAD WITHOUT CONTRAST
TECHNIQUE: Angiographic images of the Circle of Willis were obtained using MRA
technique without intravenous contrast.

[Series 5: TOF · axial · 0.5mm · 0.41mm/px · z∈[-162,-68]mm · 19 of 205 slices shown]
[im 1/205]
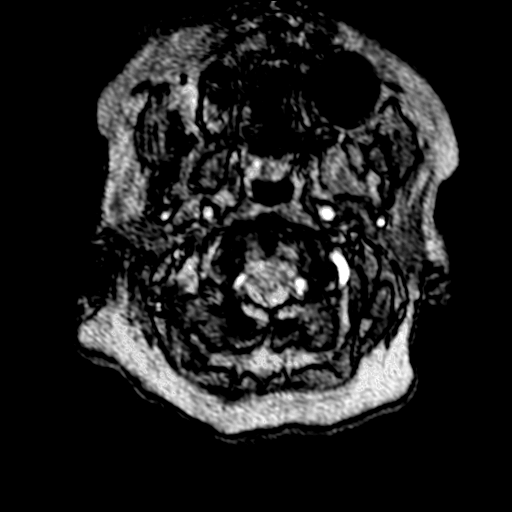
[im 5/205]
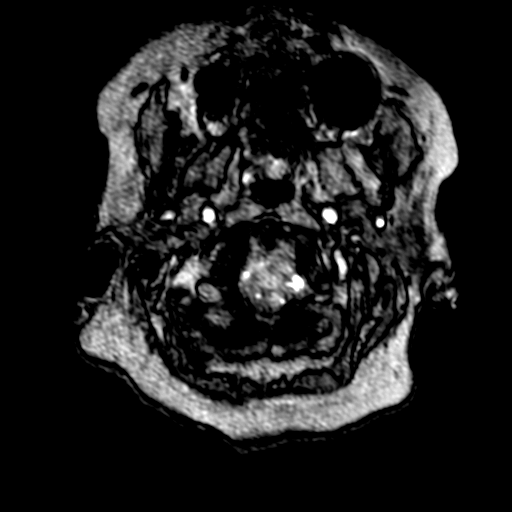
[im 9/205]
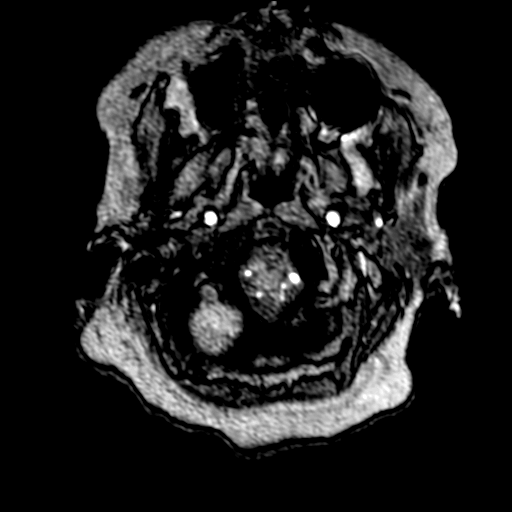
[im 14/205]
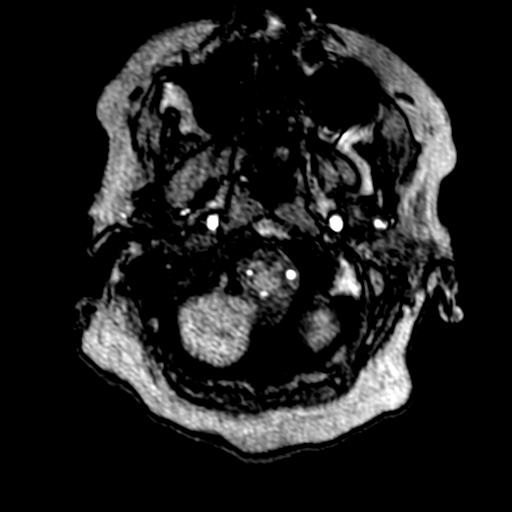
[im 18/205]
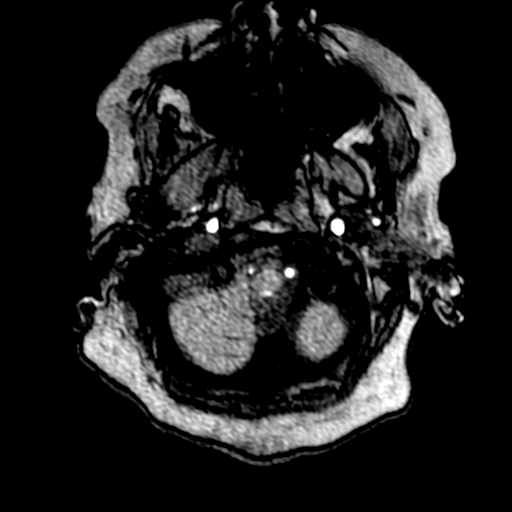
[im 22/205]
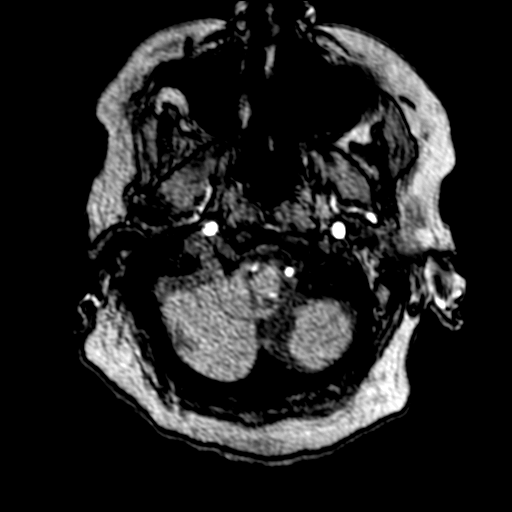
[im 27/205]
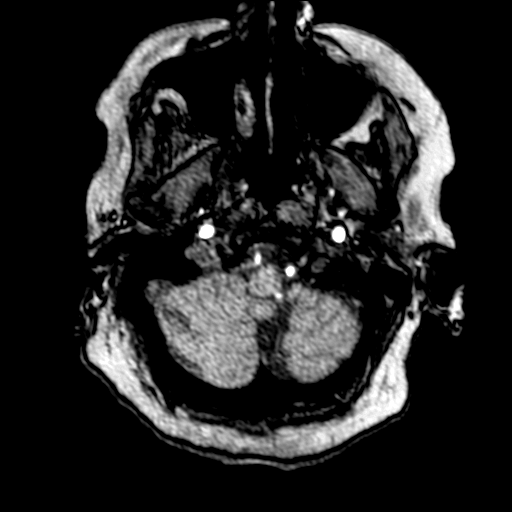
[im 31/205]
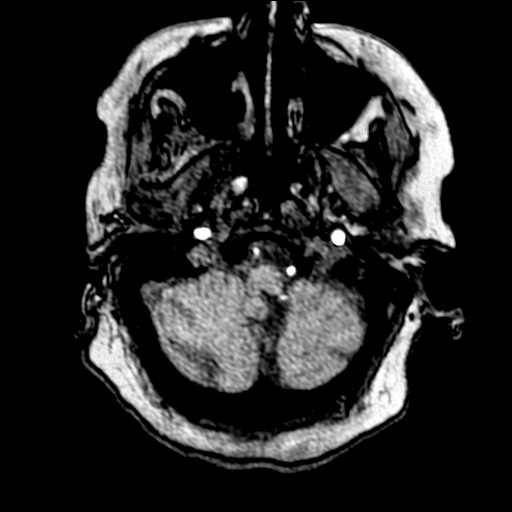
[im 35/205]
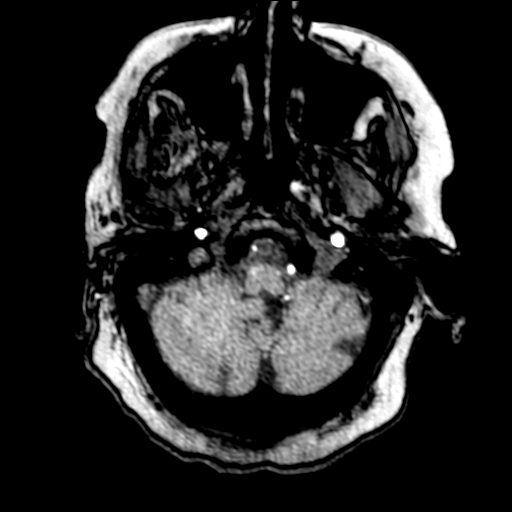
[im 40/205]
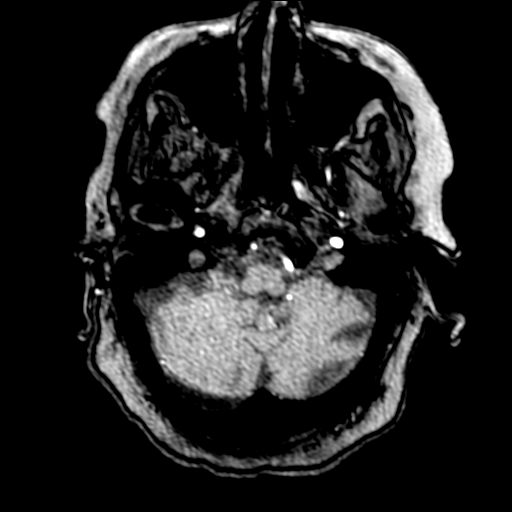
[im 44/205]
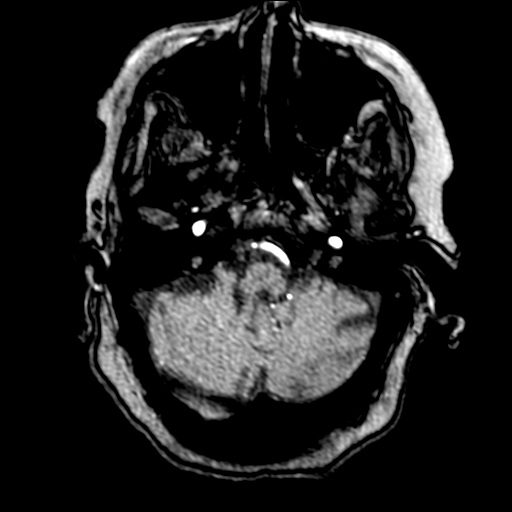
[im 66/205]
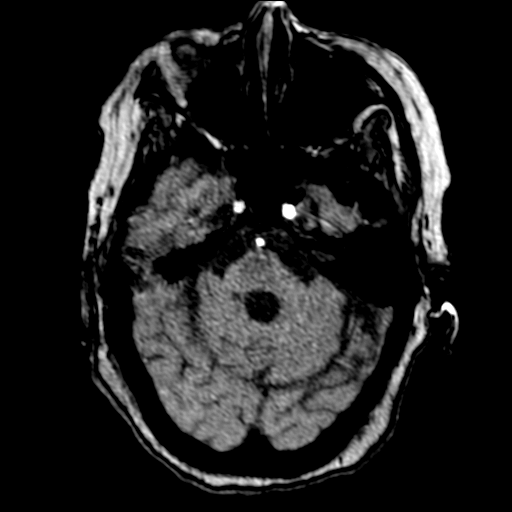
[im 92/205]
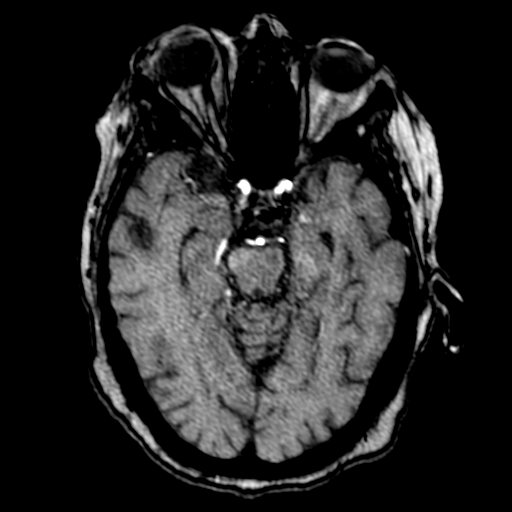
[im 105/205]
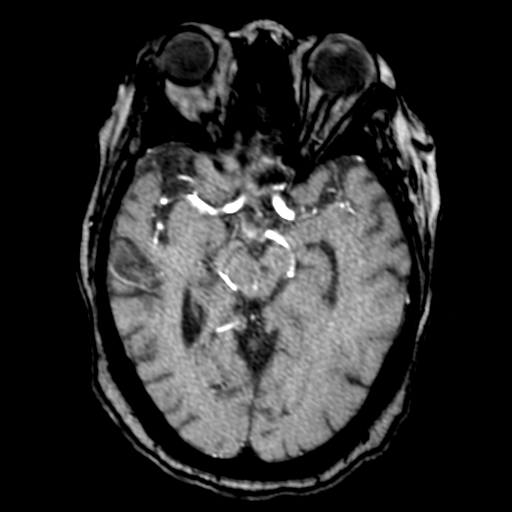
[im 118/205]
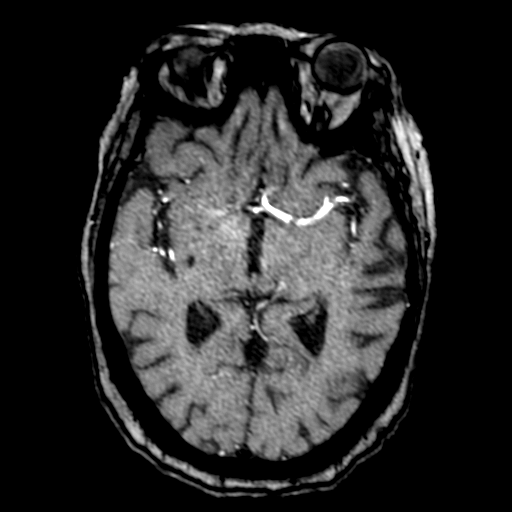
[im 144/205]
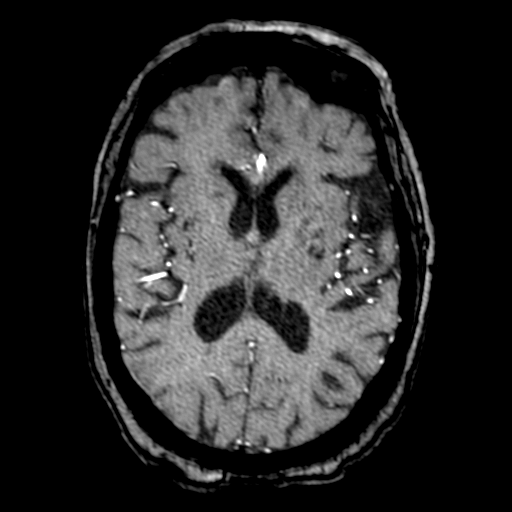
[im 170/205]
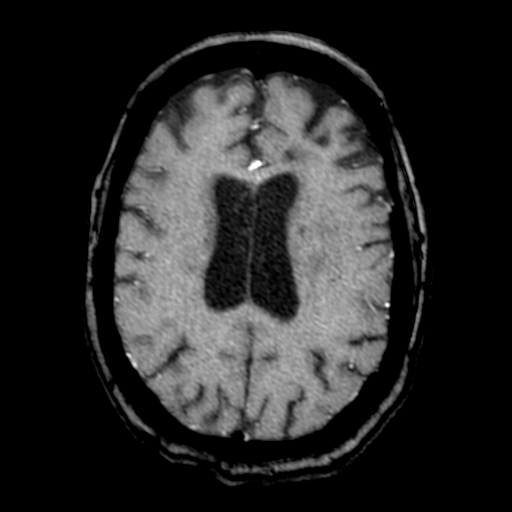
[im 174/205]
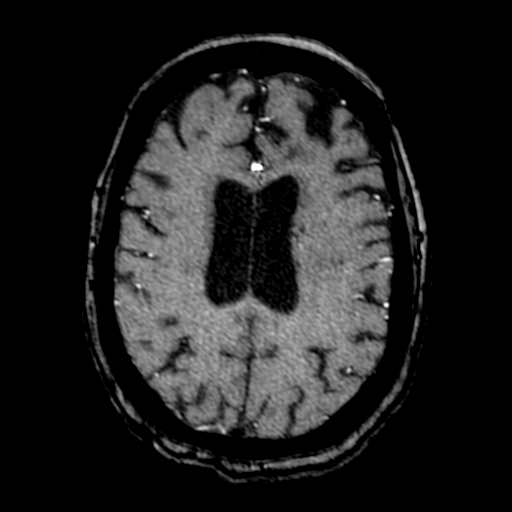
[im 196/205]
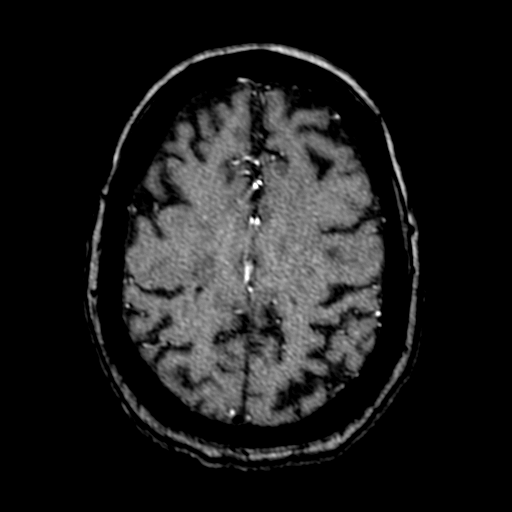

[19 of 48 positions shown; findings below may reference images not displayed]

FINDINGS: No intracranial mass effect or ventriculomegaly.

Less motion artifact today compared to the [place] MRA.

Stable antegrade flow in the posterior circulation with dominant
appearing left vertebral artery. No distal vertebral stenosis.
Patent vertebrobasilar junction. Patent basilar artery with mild to
moderate irregularity but only mild stenosis which appears stable
(series 5876, image 11). Patent SCA and PCA origins. Fetal type left
PCA again noted. Right posterior communicating artery remains
patent. Bilateral PCA branches are within normal limits.

Stable antegrade flow in both ICA siphons. Probable artifact today
at the right anterior genu, which was only mildly irregular in
[REDACTED], and supraclinoid flow signal appears stable. Left ICA
siphon irregularity without stenosis. Carotid termini, MCA and ACA
origins remain patent. Dominant left ACA as before. Anterior
communicating artery and bilateral ACA branches are stable and
within normal limits. MCA M1 segments and MCA bifurcations remain
patent. Mild irregularity and stenosis of left MCA M2 branches is
more apparent today but stable, including the posterior left M2 seen
on series 6432, image 5.
IMPRESSION: 1. Intracranial MRA is negative for large vessel occlusion and
appears stable since [DATE]. Suspect artifact at the right anterior genu today. There does
appear to be mild atherosclerotic stenosis of the:
- mid Basilar Artery.
- bilateral MCA M2 branches.

## 2022-06-26 IMAGING — US US CAROTID DUPLEX BILAT
1 series · 13 of 24 positions shown · non-contrast
Comparison: None.

CLINICAL DATA: Cerebral infarction, hypertension, hyperlipidemia
and diabetes.

EXAM:
BILATERAL CAROTID DUPLEX ULTRASOUND
TECHNIQUE: Gray scale imaging, color Doppler and duplex ultrasound were
performed of bilateral carotid and vertebral arteries in the neck.

[Series 1: us carotid bilateral · 13 of 62 slices shown]
[im 1/62]
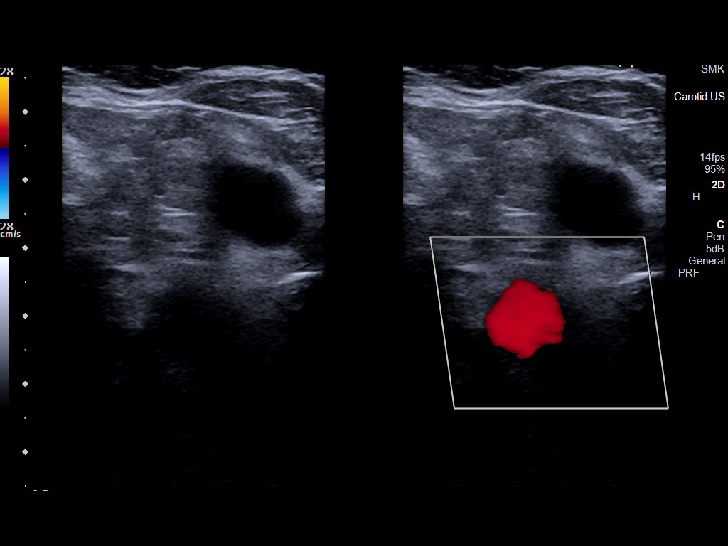
[im 6/62]
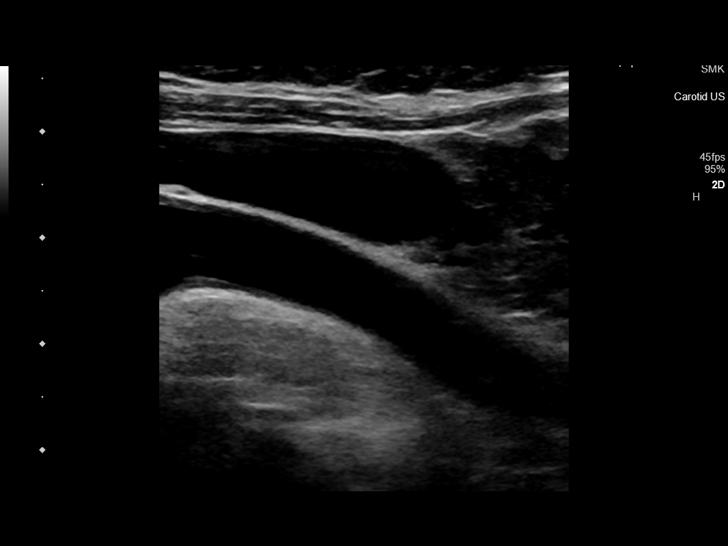
[im 11/62]
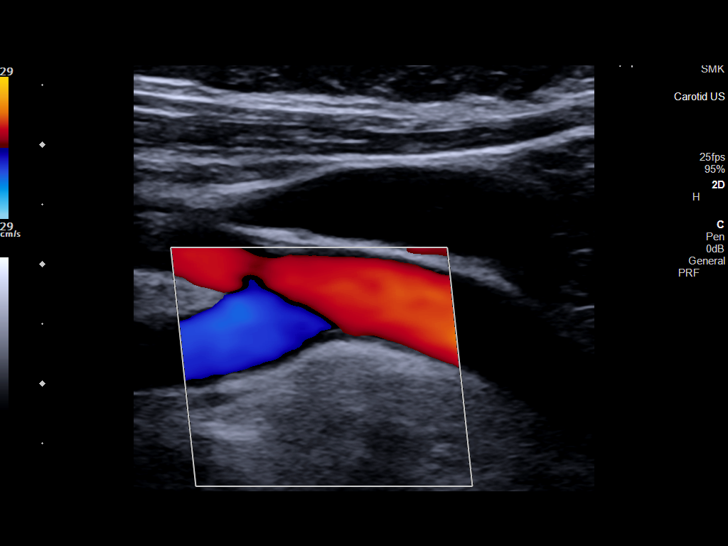
[im 16/62]
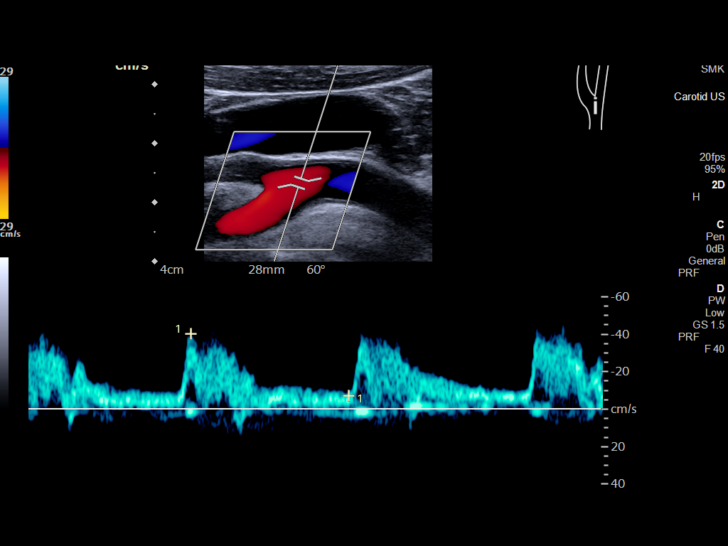
[im 22/62]
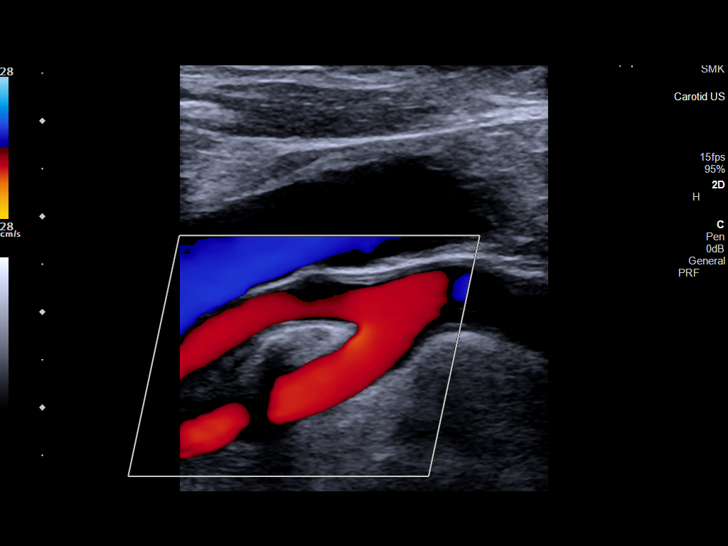
[im 27/62]
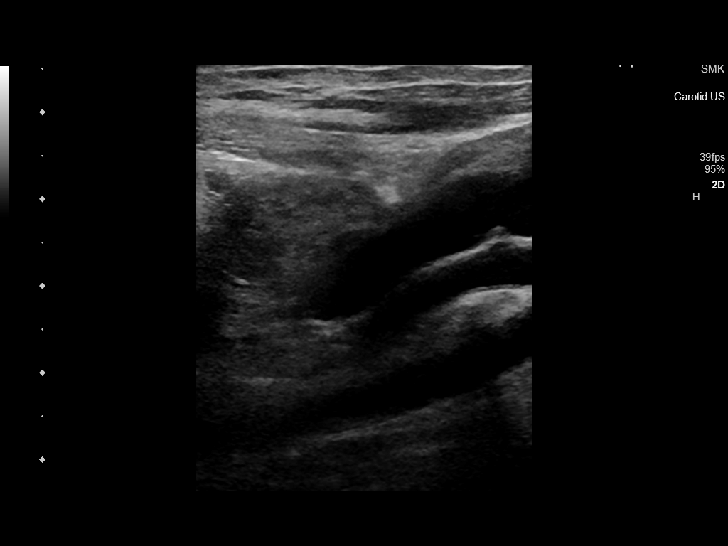
[im 32/62]
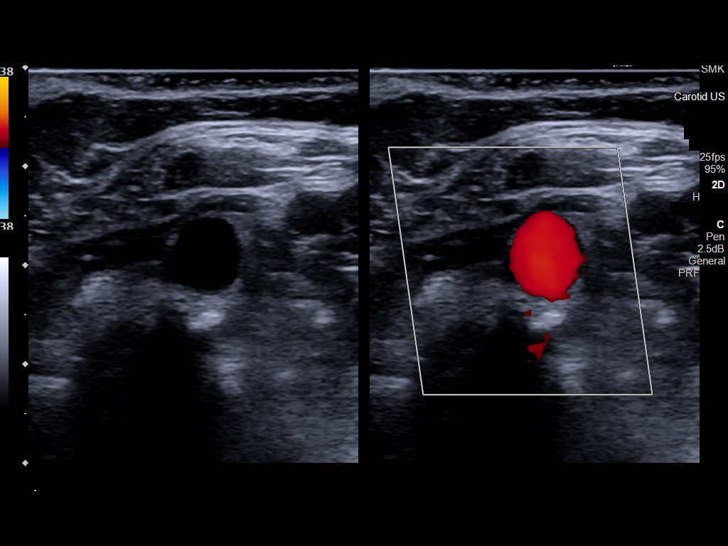
[im 35/62]
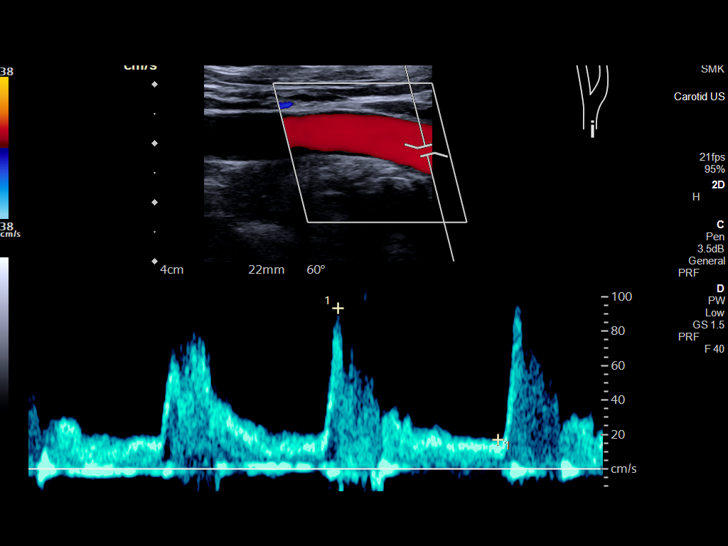
[im 40/62]
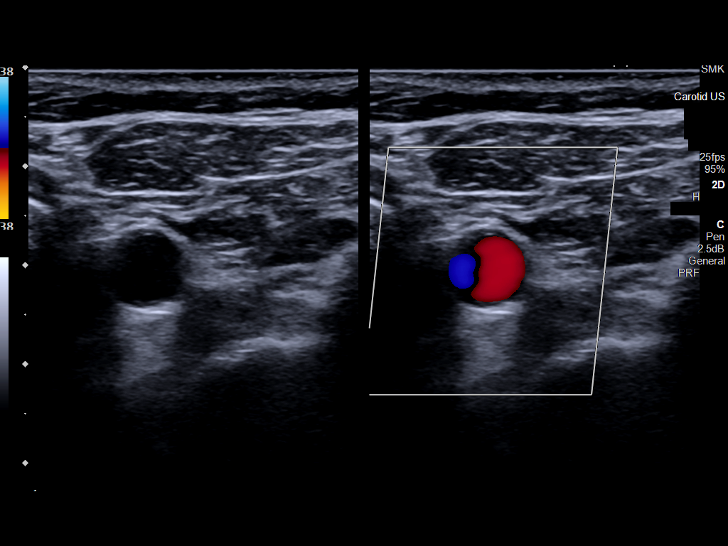
[im 46/62]
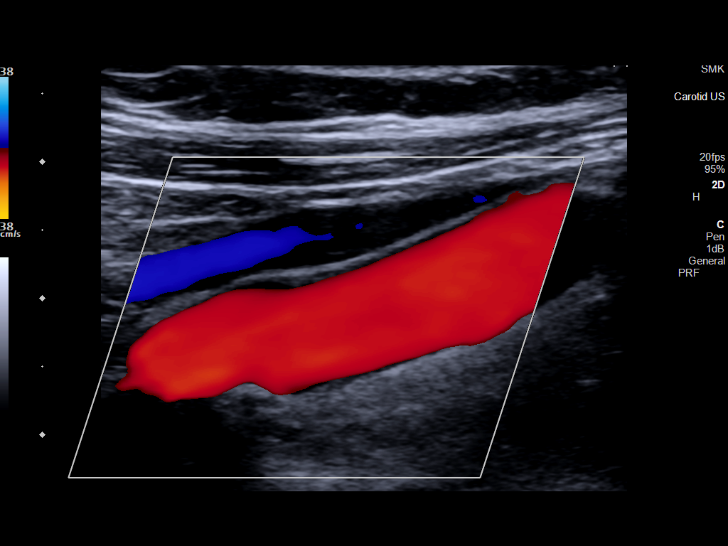
[im 51/62]
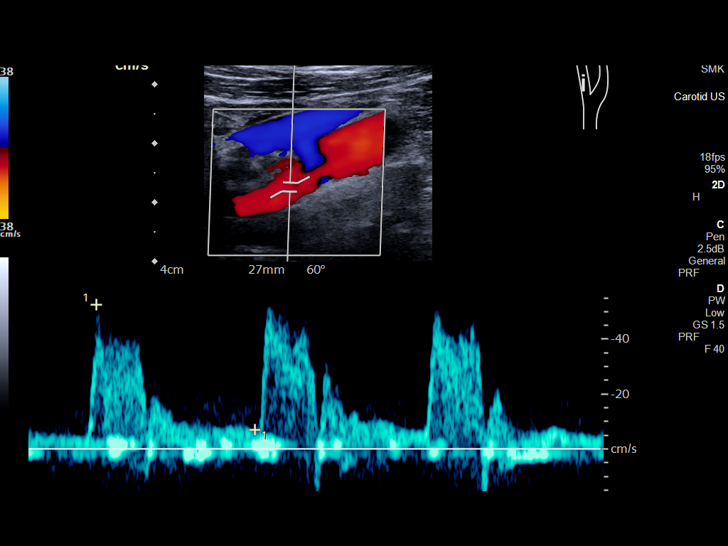
[im 56/62]
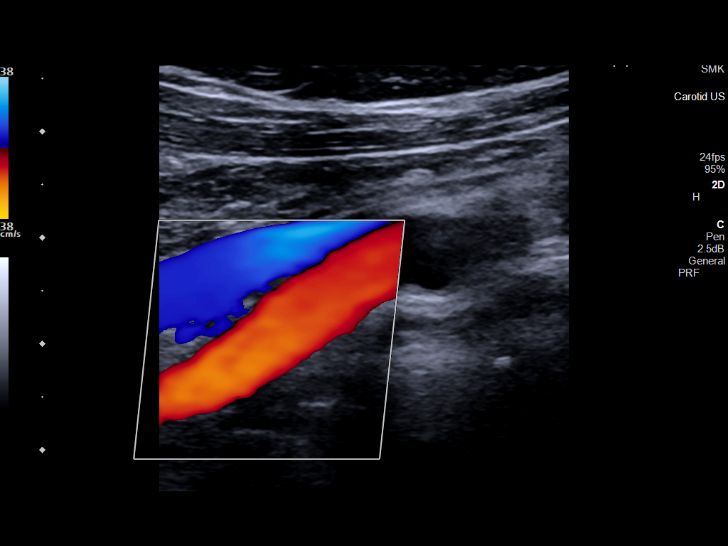
[im 62/62]
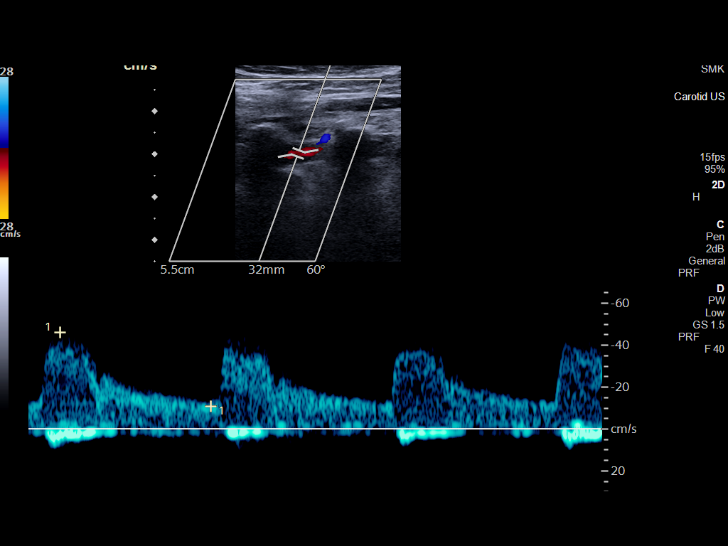

[13 of 24 positions shown; findings below may reference images not displayed]

FINDINGS: Criteria: Quantification of carotid stenosis is based on velocity
parameters that correlate the residual internal carotid diameter
with NASCET-based stenosis levels, using the diameter of the distal
internal carotid lumen as the denominator for stenosis measurement.

The following velocity measurements were obtained:

RIGHT

ICA:  58/16 cm/sec

CCA:  65/10 cm/sec

SYSTOLIC ICA/CCA RATIO:

ECA:  70 cm/sec

LEFT

ICA:  64/22 cm/sec

CCA:  64/16 cm/sec

SYSTOLIC ICA/CCA RATIO:

ECA:  53 cm/sec

RIGHT CAROTID ARTERY: There is a mild amount of partially calcified
plaque at the level of the distal bulb and proximal right ICA.
Estimated right ICA stenosis is less than 50%.

RIGHT VERTEBRAL ARTERY: Antegrade flow with normal waveform and
velocity.

LEFT CAROTID ARTERY: No focal plaque identified. No evidence of left
carotid stenosis.

LEFT VERTEBRAL ARTERY: Antegrade flow with normal waveform and
velocity.
IMPRESSION: 1. Mild plaque at the level of the right carotid bifurcation with
estimated proximal right ICA stenosis of less than 50%.
2. No evidence of left-sided carotid plaque or stenosis in the neck.

## 2022-06-29 ENCOUNTER — Telehealth: Payer: Self-pay

## 2022-06-29 DIAGNOSIS — G4733 Obstructive sleep apnea (adult) (pediatric): Secondary | ICD-10-CM | POA: Diagnosis not present

## 2022-06-29 DIAGNOSIS — I10 Essential (primary) hypertension: Secondary | ICD-10-CM | POA: Diagnosis not present

## 2022-06-29 DIAGNOSIS — R69 Illness, unspecified: Secondary | ICD-10-CM | POA: Diagnosis not present

## 2022-06-29 DIAGNOSIS — G4700 Insomnia, unspecified: Secondary | ICD-10-CM | POA: Diagnosis not present

## 2022-06-29 DIAGNOSIS — R0681 Apnea, not elsewhere classified: Secondary | ICD-10-CM | POA: Diagnosis not present

## 2022-06-29 DIAGNOSIS — Z8673 Personal history of transient ischemic attack (TIA), and cerebral infarction without residual deficits: Secondary | ICD-10-CM | POA: Diagnosis not present

## 2022-06-29 NOTE — Telephone Encounter (Signed)
Patient states she is following up with [place] to see if we have received the prior authorization for her [place] sensor and reader.  Patient states it has been 10 days and it doesn't usually take this long.

## 2022-06-29 NOTE — Telephone Encounter (Signed)
Pt has been informed that the PA was sent on 06/25/22 for the reader and once we get a determination we will update her.

## 2022-06-30 ENCOUNTER — Telehealth: Payer: Self-pay | Admitting: Nurse Practitioner

## 2022-06-30 DIAGNOSIS — Z0100 Encounter for examination of eyes and vision without abnormal findings: Secondary | ICD-10-CM | POA: Diagnosis not present

## 2022-06-30 NOTE — Telephone Encounter (Signed)
Copied from CRM [id number]. Topic: Medicare AWV >> Jun 30, 2022  3:12 PM Rushie Goltz wrote: Reason for CRM: Called patient to schedule Medicare Annual Wellness Visit (AWV). Left message for patient to call back and schedule Medicare Annual Wellness Visit (AWV).  Last date of AWV:  AWVI eligible as of 08/22/2015  Please schedule an AWVI appointment at any time with [facility] VISIT.  If any questions, please contact me at [phone].   Thank you,  [facility] Support [facility] Direct dial  [phone]

## 2022-07-02 ENCOUNTER — Other Ambulatory Visit: Payer: Self-pay | Admitting: Obstetrics and Gynecology

## 2022-07-02 ENCOUNTER — Telehealth: Payer: Self-pay | Admitting: Nurse Practitioner

## 2022-07-02 DIAGNOSIS — E1165 Type 2 diabetes mellitus with hyperglycemia: Secondary | ICD-10-CM

## 2022-07-02 MED ORDER — TIRZEPATIDE 5 MG/0.5ML ~~LOC~~ SOAJ
5.0000 mg | SUBCUTANEOUS | 0 refills | Status: DC
Start: 2022-07-02 — End: 2022-08-13

## 2022-07-02 NOTE — Telephone Encounter (Signed)
Patient called her pharmacy for her tirzepatide [facility]) 5 MG/0.5ML Pen and they do not have, but [facility] in [place] does have  tirzepatide [facility]) 5 MG/0.5ML Pen . Please send prescription to [facility].

## 2022-07-02 NOTE — Telephone Encounter (Signed)
Sent mounjaro the next dose to [facility] for pt, pt is aware to finish the 2.5 mg dose (she has one more week) and the she can go to the 5 mg dose. Pt wanted to inform you and to thank you her blood sugar is going down

## 2022-07-12 DIAGNOSIS — R69 Illness, unspecified: Secondary | ICD-10-CM | POA: Diagnosis not present

## 2022-07-12 DIAGNOSIS — R0681 Apnea, not elsewhere classified: Secondary | ICD-10-CM | POA: Diagnosis not present

## 2022-07-12 DIAGNOSIS — G4700 Insomnia, unspecified: Secondary | ICD-10-CM | POA: Diagnosis not present

## 2022-07-12 DIAGNOSIS — G4733 Obstructive sleep apnea (adult) (pediatric): Secondary | ICD-10-CM | POA: Diagnosis not present

## 2022-07-12 DIAGNOSIS — I10 Essential (primary) hypertension: Secondary | ICD-10-CM | POA: Diagnosis not present

## 2022-07-13 ENCOUNTER — Ambulatory Visit: Payer: Medicare HMO | Admitting: Obstetrics and Gynecology

## 2022-07-14 ENCOUNTER — Telehealth: Payer: Self-pay

## 2022-07-14 NOTE — Telephone Encounter (Signed)
Pt needing PA on libre sensor and reader   Provider: [name] NP NPI: [id number]

## 2022-07-14 NOTE — Telephone Encounter (Signed)
Sent the pharmacy team the PA request for the [facility] sensor and reader.   Informed pt that if I have not heard from them by Friday ( her OV) I will reach out to them as well, I informed pt that we can give her a sample of the free style libre and see if her phone is compatible with the app, so that she can have something until she is able to receive her Rx sensor and reader.   Pt was understanding and appreciated that and stated she will bring in her phone during OV.   I also informed her that we can give her her A1c readings at her OV on Friday.

## 2022-07-14 NOTE — Telephone Encounter (Signed)
Patient called, she called her insurance company about her [place]. They told her they never received the request. Patient is very upset about not getting her Josephine Igo, this has been going on since March 26th.  She also wants to know her average of A1c/glucose readings.

## 2022-07-15 DIAGNOSIS — E1151 Type 2 diabetes mellitus with diabetic peripheral angiopathy without gangrene: Secondary | ICD-10-CM | POA: Diagnosis not present

## 2022-07-15 DIAGNOSIS — Z6841 Body Mass Index (BMI) 40.0 and over, adult: Secondary | ICD-10-CM | POA: Diagnosis not present

## 2022-07-15 DIAGNOSIS — M19012 Primary osteoarthritis, left shoulder: Secondary | ICD-10-CM | POA: Diagnosis not present

## 2022-07-16 ENCOUNTER — Encounter: Payer: Self-pay | Admitting: Nurse Practitioner

## 2022-07-16 ENCOUNTER — Ambulatory Visit (INDEPENDENT_AMBULATORY_CARE_PROVIDER_SITE_OTHER): Payer: Medicare HMO | Admitting: Nurse Practitioner

## 2022-07-16 VITALS — BP 118/62 | HR 77 | Temp 98.0°F | Ht 64.0 in | Wt 271.6 lb

## 2022-07-16 DIAGNOSIS — Z7985 Long-term (current) use of injectable non-insulin antidiabetic drugs: Secondary | ICD-10-CM | POA: Diagnosis not present

## 2022-07-16 DIAGNOSIS — B3731 Acute candidiasis of vulva and vagina: Secondary | ICD-10-CM | POA: Diagnosis not present

## 2022-07-16 DIAGNOSIS — Z7984 Long term (current) use of oral hypoglycemic drugs: Secondary | ICD-10-CM | POA: Diagnosis not present

## 2022-07-16 DIAGNOSIS — M19012 Primary osteoarthritis, left shoulder: Secondary | ICD-10-CM

## 2022-07-16 DIAGNOSIS — E1165 Type 2 diabetes mellitus with hyperglycemia: Secondary | ICD-10-CM

## 2022-07-16 MED ORDER — FLUCONAZOLE 150 MG PO TABS
ORAL_TABLET | ORAL | 1 refills | Status: DC
Start: 2022-07-16 — End: 2022-08-13

## 2022-07-16 MED ORDER — EMPAGLIFLOZIN 25 MG PO TABS
25.0000 mg | ORAL_TABLET | Freq: Every day | ORAL | 3 refills | Status: DC
Start: 2022-07-16 — End: 2023-07-01

## 2022-07-16 NOTE — Assessment & Plan Note (Addendum)
Average blood sugar continues to be above 200. Patient did her first injection of Mounjaro 5 mg today. She will continue this dose for 3 weeks then increase to 7.5 mg weekly. Will increase Jardiance to 25 mg daily. Encouraged to continue healthy diet. Will continue to monitor closely. She will follow up in 4 weeks.

## 2022-07-16 NOTE — Assessment & Plan Note (Addendum)
Anticipating surgery in June with [facility] if able to get A1c down to 8.0. Pending Pulmonary and Cardiology clearances also.

## 2022-07-16 NOTE — Assessment & Plan Note (Addendum)
Since starting Jardiance she reports having one episode of a yeast infection. She is worried she will get one again with increasing dosage of Jardiance. Will send in Diflucan for patient to have on hand. Encouraged her to contact me if reoccurring or worsening. Will monitor.

## 2022-07-16 NOTE — Progress Notes (Signed)
Dawn Dicker, NP-C Phone: [phone]  [name] is a 72 y.o. adult who presents today for follow up.   Patient was supposed to have left shoulder surgery with [facility] on 06/24/22 but was unable to do so due to an elevated A1c. She was seen yesterday by [facility] for a second opinion who agreed to proceed with surgery if her A1c can come down at least one point. They are aiming for a surgery date of June 11th.   DIABETES Disease Monitoring: Blood Sugar ranges- FreeStyle Libre reviewed, 14 day average 211 Polyuria/phagia/dipsia- No      Optho- Yes Medications: Compliance- Mounjaro 5 mg and Jardiance 10 mg Hypoglycemic symptoms- No Last A1c- 9.1 in March   Patient reports recently making multiple dietary changes. She has been watching her carb intake closely and decreasing her snacking.   Social History   Tobacco Use  Smoking Status Never  Smokeless Tobacco Never    Current Outpatient Medications on File Prior to Visit  Medication Sig Dispense Refill   acetaminophen (TYLENOL) 325 MG tablet Take 2 tablets (650 mg total) by mouth every 6 (six) hours as needed for mild pain or moderate pain (or Fever >/= 101).     albuterol (PROVENTIL) (2.5 MG/3ML) 0.083% nebulizer solution Take 3 mLs (2.5 mg total) by nebulization every 4 (four) hours as needed for wheezing or shortness of breath. 75 mL 12   ALPRAZolam (XANAX) 0.25 MG tablet Take 1 tablet (0.25 mg total) by mouth 2 (two) times daily as needed for anxiety. See SIG change was taking 1/2 of 0.5 tablet bid prn changed to 0.25 bid prn. D/c 0.5 xanax 60 tablet 2   buPROPion (WELLBUTRIN XL) 150 MG 24 hr tablet Take 1 tablet (150 mg total) by mouth daily. 90 tablet 3   calcium-vitamin D (OSCAL WITH D) 500-200 MG-UNIT tablet Take 1 tablet by mouth 2 (two) times daily with a meal. 60 tablet 0   cephALEXin (KEFLEX) 250 MG capsule TAKE 1 CAPSULE BY MOUTH EVERY DAY 30 capsule 0   cholecalciferol (VITAMIN D) 25 MCG tablet Take 1 tablet  (1,000 Units total) by mouth 2 (two) times daily with a meal. 30 tablet 2   clobetasol cream (TEMOVATE) 0.05 % Apply 1 application topically 2 (two) times daily as needed (skin irritation).      Continuous Blood Gluc Receiver (FREESTYLE LIBRE 3 READER) DEVI 1 each by Does not apply route daily. Use as directed to check continuous blood glucose monitor. 1 each 0   Continuous Blood Gluc Sensor (FREESTYLE LIBRE 3 SENSOR) MISC Place 1 sensor on the skin every 14 days. Use to check glucose continuously 2 each 11   diclofenac Sodium (VOLTAREN) 1 % GEL Apply 2 g topically 3 (three) times daily. 100 g 2   doxycycline (VIBRA-TABS) 100 MG tablet Take 1 tablet (100 mg total) by mouth 2 (two) times daily. 14 tablet 0   DULoxetine (CYMBALTA) 60 MG capsule Take 1 capsule (60 mg total) by mouth daily. 90 capsule 3   ELIQUIS 5 MG TABS tablet TAKE 1 TABLET BY MOUTH TWICE DAILY 180 tablet 1   enoxaparin (LOVENOX) 120 MG/0.8ML injection Inject 0.8 mLs (120 mg total) into the skin every 12 (twelve) hours. 4 mL 0   ergocalciferol (VITAMIN D2) 1.25 MG (50000 UT) capsule Take 50,000 Units by mouth once a week.     estradiol (ESTRACE) 0.1 MG/GM vaginal cream Apply 0.5mg  (pea-sized amount)  just inside the vaginal introitus with a finger-tip on  Monday, Wednesday and Friday nights, 42.5 g 11   Fluticasone-Umeclidin-Vilant (TRELEGY ELLIPTA) 200-62.5-25 MCG/ACT AEPB Inhale 1 puff into the lungs daily. 60 each 0   folic acid (FOLVITE) 1 MG tablet Take 1 tablet (1 mg total) by mouth daily. 90 tablet 3   furosemide (LASIX) 40 MG tablet Take 1 tablet (40 mg total) by mouth as directed. Take 1 tablet daily. May take additional 1 tablet If weight gain of greater than 2 pounds in 24 hours or 5 pounds in one week. 90 tablet 3   GEMTESA 75 MG TABS TAKE 1 TABLET BY MOUTH DAILY 30 tablet 11   glucose blood (ONETOUCH ULTRA) test strip TEST TWICE DAILY AS DIRECTED 100 each 11   HYDROcodone-acetaminophen (NORCO/VICODIN) 5-325 MG tablet Take  1 tablet by mouth 2 (two) times daily as needed for severe pain. Must last 30 days 45 tablet 0   HYDROcodone-acetaminophen (NORCO/VICODIN) 5-325 MG tablet Take 1 tablet by mouth 2 (two) times daily as needed for severe pain. Must last 30 days 45 tablet 0   [START ON 08/01/2022] HYDROcodone-acetaminophen (NORCO/VICODIN) 5-325 MG tablet Take 1 tablet by mouth 2 (two) times daily as needed for severe pain. Must last 30 days 45 tablet 0   Lancets (ONETOUCH ULTRASOFT) lancets Use as instructed 100 each 12   levocetirizine (XYZAL) 5 MG tablet TAKE ONE TABLET BY MOUTH AT BEDTIME AS NEEDED FOR ALLERGIES 90 tablet 3   levothyroxine (SYNTHROID) 137 MCG tablet Take 1 tablet (137 mcg total) by mouth daily before breakfast. 30 minutes do not mix with vitamins or acid reflux medications D/c 150 mcg 90 tablet 3   losartan (COZAAR) 50 MG tablet TAKE 1 TABLET BY MOUTH DAILY 90 tablet 3   metoprolol succinate (TOPROL-XL) 25 MG 24 hr tablet Take 0.5 tablets (12.5 mg total) by mouth daily. 45 tablet 3   Misc. Devices (TUB TRANSFER BOARD) MISC 1 Device by Does not apply route as needed. 1 each 0   montelukast (SINGULAIR) 10 MG tablet Take 1 tablet (10 mg total) by mouth at bedtime. 90 tablet 3   Multiple Vitamin (MULTIVITAMIN WITH MINERALS) TABS tablet Take 1 tablet by mouth daily.     mupirocin ointment (BACTROBAN) 2 % APPLY TOPICALLY TWICE DAILY TO RIGHT LOWER LEG AS DIRECTED. 22 g 0   naloxone (NARCAN) nasal spray 4 mg/0.1 mL Place 1 spray into the nose as needed for up to 365 doses (for opioid-induced respiratory depresssion). In case of emergency (overdose), spray once into each nostril. If no response within 3 minutes, repeat application and call 911. 1 each 0   nystatin cream (MYCOSTATIN) Apply 1 application. topically 2 (two) times daily. Angles of mouth or skin folds prn 30 g 11   ondansetron (ZOFRAN) 4 MG tablet Take 1 tablet (4 mg total) by mouth every 8 (eight) hours as needed for nausea or vomiting. 90 tablet 1    pantoprazole (PROTONIX) 40 MG tablet Take 1 tablet (40 mg total) by mouth daily. 30 min before food 90 tablet 3   Powders (GOLD BOND NO MESS BODY POWDER) AERP Apply 1 application. topically daily as needed. Gold bond talc free powder 200 g 11   pregabalin (LYRICA) 50 MG capsule TAKE 1 CAPSULE BY MOUTH 3 TIMES DAILY 90 capsule 2   rosuvastatin (CRESTOR) 20 MG tablet TAKE 1 TABLET BY MOUTH DAILY 90 tablet 1   sucralfate (CARAFATE) 1 GM/10ML suspension Take 1 g by mouth 3 (three) times daily.      thiamine  100 MG tablet Take 0.5 tablets (50 mg total) by mouth 2 (two) times daily with a meal. 60 tablet 2   tirzepatide (MOUNJARO) 2.5 MG/0.5ML Pen Inject 2.5 mg into the skin once a week. 2 mL 0   tirzepatide (MOUNJARO) 5 MG/0.5ML Pen Inject 5 mg into the skin once a week. 2 mL 0   No current facility-administered medications on file prior to visit.    ROS see history of present illness  Objective  Physical Exam Vitals:   07/16/22 1507  BP: 118/62  Pulse: 77  Temp: 98 F (36.7 C)  SpO2: 98%    BP Readings from Last 3 Encounters:  07/16/22 118/62  06/12/22 120/74  05/25/22 117/77   Wt Readings from Last 3 Encounters:  07/16/22 271 lb 9.6 oz (123.2 kg)  06/12/22 267 lb 11.2 oz (121.4 kg)  05/25/22 284 lb (128.8 kg)    Physical Exam Constitutional:      General: She is not in acute distress.    Appearance: Normal appearance.  HENT:     Head: Normocephalic.  Cardiovascular:     Rate and Rhythm: Normal rate and regular rhythm.     Heart sounds: Normal heart sounds.  Pulmonary:     Effort: Pulmonary effort is normal.     Breath sounds: Normal breath sounds.  Skin:    General: Skin is warm and dry.  Neurological:     General: No focal deficit present.     Mental Status: She is alert.  Psychiatric:        Mood and Affect: Mood normal.        Behavior: Behavior normal.    Assessment/Plan: Please see individual problem list.  Type 2 diabetes mellitus with hyperglycemia,  without long-term current use of insulin Assessment & Plan: Average blood sugar continues to be above 200. Patient did her first injection of Mounjaro 5 mg today. She will continue this dose for 3 weeks then increase to 7.5 mg weekly. Will increase Jardiance to 25 mg daily. Encouraged to continue healthy diet. Will continue to monitor closely. She will follow up in 4 weeks.   Orders: -     Empagliflozin; Take 1 tablet (25 mg total) by mouth daily before breakfast.  Dispense: 90 tablet; Refill: 3  Primary osteoarthritis of left shoulder Assessment & Plan: Anticipating surgery in June with [facility] if able to get A1c down to 8.0. Pending Pulmonary and Cardiology clearances also.    Yeast vaginitis Assessment & Plan: Since starting Jardiance she reports having one episode of a yeast infection. She is worried she will get one again with increasing dosage of Jardiance. Will send in Diflucan for patient to have on hand. Encouraged her to contact me if reoccurring or worsening. Will monitor.   Orders: -     Fluconazole; Take 1 tablet by mouth x 1 dose. May repeat in 72 hours if needed.  Dispense: 2 tablet; Refill: 1   Return in about 4 weeks (around 08/13/2022) for Follow up.   Dawn Dicker, NP-C [facility] [employer]

## 2022-07-17 ENCOUNTER — Inpatient Hospital Stay: Payer: Medicare HMO | Attending: Oncology | Admitting: Oncology

## 2022-07-17 ENCOUNTER — Inpatient Hospital Stay: Payer: Medicare HMO

## 2022-07-17 VITALS — BP 132/79 | HR 95 | Temp 96.6°F | Ht 66.0 in | Wt 272.3 lb

## 2022-07-17 DIAGNOSIS — Z7901 Long term (current) use of anticoagulants: Secondary | ICD-10-CM | POA: Insufficient documentation

## 2022-07-17 DIAGNOSIS — E119 Type 2 diabetes mellitus without complications: Secondary | ICD-10-CM | POA: Diagnosis not present

## 2022-07-17 DIAGNOSIS — I2721 Secondary pulmonary arterial hypertension: Secondary | ICD-10-CM | POA: Insufficient documentation

## 2022-07-17 DIAGNOSIS — Z7984 Long term (current) use of oral hypoglycemic drugs: Secondary | ICD-10-CM | POA: Diagnosis not present

## 2022-07-17 DIAGNOSIS — J45909 Unspecified asthma, uncomplicated: Secondary | ICD-10-CM | POA: Diagnosis not present

## 2022-07-17 DIAGNOSIS — I480 Paroxysmal atrial fibrillation: Secondary | ICD-10-CM | POA: Diagnosis not present

## 2022-07-17 DIAGNOSIS — Z791 Long term (current) use of non-steroidal anti-inflammatories (NSAID): Secondary | ICD-10-CM | POA: Insufficient documentation

## 2022-07-17 DIAGNOSIS — Z79899 Other long term (current) drug therapy: Secondary | ICD-10-CM | POA: Insufficient documentation

## 2022-07-17 DIAGNOSIS — I509 Heart failure, unspecified: Secondary | ICD-10-CM | POA: Diagnosis not present

## 2022-07-17 DIAGNOSIS — G4730 Sleep apnea, unspecified: Secondary | ICD-10-CM | POA: Insufficient documentation

## 2022-07-17 DIAGNOSIS — E1122 Type 2 diabetes mellitus with diabetic chronic kidney disease: Secondary | ICD-10-CM | POA: Diagnosis not present

## 2022-07-17 DIAGNOSIS — Z87442 Personal history of urinary calculi: Secondary | ICD-10-CM | POA: Diagnosis not present

## 2022-07-17 DIAGNOSIS — I872 Venous insufficiency (chronic) (peripheral): Secondary | ICD-10-CM | POA: Insufficient documentation

## 2022-07-17 DIAGNOSIS — I081 Rheumatic disorders of both mitral and tricuspid valves: Secondary | ICD-10-CM | POA: Insufficient documentation

## 2022-07-17 DIAGNOSIS — I130 Hypertensive heart and chronic kidney disease with heart failure and stage 1 through stage 4 chronic kidney disease, or unspecified chronic kidney disease: Secondary | ICD-10-CM | POA: Diagnosis not present

## 2022-07-17 DIAGNOSIS — N183 Chronic kidney disease, stage 3 unspecified: Secondary | ICD-10-CM | POA: Diagnosis not present

## 2022-07-17 DIAGNOSIS — Z8673 Personal history of transient ischemic attack (TIA), and cerebral infarction without residual deficits: Secondary | ICD-10-CM | POA: Insufficient documentation

## 2022-07-17 DIAGNOSIS — D509 Iron deficiency anemia, unspecified: Secondary | ICD-10-CM | POA: Insufficient documentation

## 2022-07-17 DIAGNOSIS — Z86718 Personal history of other venous thrombosis and embolism: Secondary | ICD-10-CM | POA: Insufficient documentation

## 2022-07-17 DIAGNOSIS — E039 Hypothyroidism, unspecified: Secondary | ICD-10-CM | POA: Diagnosis not present

## 2022-07-17 DIAGNOSIS — M129 Arthropathy, unspecified: Secondary | ICD-10-CM | POA: Diagnosis not present

## 2022-07-17 LAB — CBC
HCT: 42.8 % (ref 36.0–46.0)
Hemoglobin: 13 g/dL (ref 12.0–15.0)
MCH: 29 pg (ref 26.0–34.0)
MCHC: 30.4 g/dL (ref 30.0–36.0)
MCV: 95.3 fL (ref 80.0–100.0)
Platelets: 213 10*3/uL (ref 150–400)
RBC: 4.49 MIL/uL (ref 3.87–5.11)
RDW: 13.6 % (ref 11.5–15.5)
WBC: 7.2 10*3/uL (ref 4.0–10.5)
nRBC: 0 % (ref 0.0–0.2)

## 2022-07-17 LAB — FERRITIN: Ferritin: 28 ng/mL (ref 11–307)

## 2022-07-17 LAB — IRON AND TIBC
Iron: 107 ug/dL (ref 28–170)
Saturation Ratios: 26 % (ref 10.4–31.8)
TIBC: 417 ug/dL (ref 250–450)
UIBC: 310 ug/dL

## 2022-07-19 ENCOUNTER — Encounter: Payer: Self-pay | Admitting: Oncology

## 2022-07-19 NOTE — Progress Notes (Signed)
Hematology/Oncology Consult note [facility]  [phone] [phone]  Patient Care Team: [name], NP as PCP - General (Nurse Practitioner) Iran Ouch, MD as PCP - Cardiology (Cardiology) Delano Metz, MD as Consulting Physician (Pain Medicine) Marykay Lex, MD as Consulting Physician (Cardiology) Poggi, Excell Seltzer, MD as Consulting Physician (Surgery) Creig Hines, MD as Consulting Physician (Oncology)   Name of the patient: [name]  [id number]  [birth year]   Date of visit: 07/19/22  Diagnosis-iron deficiency anemia  Chief complaint/ Reason for visit-routine follow-up of iron deficiency anemia  Heme/Onc history: Patient is a 72 year old female with a history of sleep apnea, bronchitis, diabetes CVA who has been referred for anemia.  Most recent CBC from June 2022 showed white cell count of 7.5, H&H of 11.1/34.5 with an MCV of 80.9 and a platelet count of 257.  Looking back at her prior CBCs her hemoglobin has been mostly between 9.5-10.5.  B12 levels in June was greater than 1550.  Folate was normal iron studies showed a low ferritin of 18 and iron saturation of 11%.  Patient currently reports fatigue which has been more than usual along with exertional shortness of breath.  Denies any blood loss in her stool or urine.  Denies any dark melanotic stools.  Last EGD was in September 2021 by Dr. Norma Fredrickson which showed possible candidiasis, benign-appearing esophageal stenosis and multiple gastric polyps which were negative for malignancy.  Last colonoscopy was in March 2019 which showed diverticulosis and polyps but no evidence of active bleeding.   Results of blood workroutine follow-up of anemia from 03/25/2021 were as follows: CBC showed white cell count of 7.6, H&H of 8.6/29.6 with an MCV of 78 and a platelet count of 239.  Ferritin levels were low at 6.  B12 level normal at 563.  No evidence of hemolysis.  Myeloma panel  showed no evidence of M protein.  TSH was mildly low at 0.14.  Patient received IV iron Venofer in February 2023.    Interval history-patient feels well overall.  Energy levels have improved.  Denies any blood loss in her stool or urine.  She will be undergoing reverse shoulder surgery soon.  ECOG PS- 2 Pain scale- 0   Review of systems- Review of Systems  Constitutional:  Negative for chills, fever, malaise/fatigue and weight loss.  HENT:  Negative for congestion, ear discharge and nosebleeds.   Eyes:  Negative for blurred vision.  Respiratory:  Negative for cough, hemoptysis, sputum production, shortness of breath and wheezing.   Cardiovascular:  Negative for chest pain, palpitations, orthopnea and claudication.  Gastrointestinal:  Negative for abdominal pain, blood in stool, constipation, diarrhea, heartburn, melena, nausea and vomiting.  Genitourinary:  Negative for dysuria, flank pain, frequency, hematuria and urgency.  Musculoskeletal:  Negative for back pain, joint pain and myalgias.  Skin:  Negative for rash.  Neurological:  Negative for dizziness, tingling, focal weakness, seizures, weakness and headaches.  Endo/Heme/Allergies:  Does not bruise/bleed easily.  Psychiatric/Behavioral:  Negative for depression and suicidal ideas. The patient does not have insomnia.       Allergies  Allergen Reactions   Morphine And Related Other (See Comments) and Nausea Only    Patient becomes very confused Other reaction(s): Other (See Comments), Other (See Comments) Patient becomes very confused Patient becomes very confused    Penicillins Hives, Shortness Of Breath, Swelling and Other (See Comments)    Facial swelling Has patient had a PCN reaction causing  immediate rash, facial/tongue/throat swelling, SOB or lightheadedness with hypotension: Yes Has patient had a PCN reaction causing severe rash involving mucus membranes or skin necrosis: Yes Has patient had a PCN reaction that  required hospitalization No Has patient had a PCN reaction occurring within the last 10 years: No If all of the above answers are "NO", then may proceed with Cephalosporin use.  Other reaction(s): Other (See Comments), Other (See Comments) Facial swelling Has patient had a PCN reaction causing immediate rash, facial/tongue/throat swelling, SOB or lightheadedness with hypotension: Yes Has patient had a PCN reaction causing severe rash involving mucus membranes or skin necrosis: Yes Has patient had a PCN reaction that required hospitalization No Has patient had a PCN reaction occurring within the last 10 years: No If all of the above answers are "NO", then may proceed with Cephalosporin use. Facial swelling Has patient had a PCN reaction causing immediate rash, facial/tongue/throat swelling, SOB or lightheadedness with hypotension: Yes Has patient had a PCN reaction causing severe rash involving mucus membranes or skin necrosis: Yes Has patient had a PCN reaction that required hospitalization No Has patient had a PCN reaction occurring within the last 10 years: No If all of the above answers are "NO", then may proceed with Cephalosporin use. Facial swelling Has patient had a PCN reaction causing immediate rash, facial/tongue/throat swelling, SOB or lightheadedness with hypotension: Yes Has patient had a PCN reaction causing severe rash involving mucus membranes or skin necrosis: Yes Has patient had a PCN reaction that required hospitalization No Has patient had a PCN reaction occurring within the last 10 years: No If all of the above answers are "NO", then may proceed with Cephalosporin use.    Ambien [Zolpidem]     Hallucination    Aspirin Hives   Oxytetracycline Hives    Other reaction(s): Unknown      Past Medical History:  Diagnosis Date   Abnormal antibody titer    Anxiety and depression    Arthritis    Asthma    Atypical chest pain    a. 03/2018 MV: No ischemia/infarct. EF  >65%.   Carotid arterial disease (HCC)    a. 03/2020 U/S: RICA <50, LICA nl.   Chronic heart failure with preserved ejection fraction (HFpEF) (HCC)    a. 08/2013 Echo: EF 60-65%; b. 08/2019 Echo: EF 60-65%; c. 03/2020 Echo: EF 60-65%; d. 05/2021 Echo: EF 55-60%, no rmwa, nl RV fxn, RVSP 54.48mmHg. Mildly dil LA, mild MR, mod TR.   CKD (chronic kidney disease), stage III (HCC)    Cystocele    Depression    Diabetes (HCC)    DM (diabetes mellitus) type II, controlled, with peripheral vascular disorder (HCC) 08/31/2014   Formatting of this note might be different from the original.  Last Assessment & Plan:   Stable. Continue metformin.   DVT (deep venous thrombosis) (HCC)    Righ calf   Dyspnea    11/08/2018 - "more now due to lack of exercise"   Edema    GERD (gastroesophageal reflux disease)    Heart murmur    History of IBS    History of kidney stones    History of methicillin resistant staphylococcus aureus (MRSA) 2008   History of multiple strokes    Hyperlipidemia    Hypertension    Hypothyroidism    Insomnia    Kidney stone    kidney stones with lithotripsy .  [facility]- "adrenal glands"   Mitral regurgitation    a. 05/2021 Echo:  Mild MR.   Neuropathy involving both lower extremities    Non-healing ulcer of left foot (HCC)    Obesity, Class II, BMI 35-39.9, with comorbidity    PAF (paroxysmal atrial fibrillation) (HCC)    a. 07/2013 Event monitor: Freq PVCs and 3 short runs of Afib-->CHA2DS2VASc = 7-->Eliquis.   PAH (pulmonary artery hypertension) (HCC)    a. 05/2021 Echo: RVSP 54.75mmHg.   Peripheral vascular disease (HCC)    a. 03/2018 LE Angio: Nl renal arteries. Nl aorta & iliacs. Nl LLE arterial flow; b. 06/2020 ABI's: Nl bilat.   Sleep apnea    use C-PAP   Stroke (HCC)    03/2020   Tricuspid regurgitation    a. 05/2021 Echo: Mod TR (in setting of RVSP 54.36mmHg).   Urinary incontinence    Venous stasis dermatitis of both lower extremities      Past Surgical History:   Procedure Laterality Date   ANKLE SURGERY Right    APPLICATION OF WOUND VAC Left 06/27/2015   Procedure: APPLICATION OF WOUND VAC ( POSSIBLE ) ;  Surgeon: Annice Needy, MD;  Location: [facility] ORS;  Service: Vascular;  Laterality: Left;   APPLICATION OF WOUND VAC Left 09/25/2016   Procedure: APPLICATION OF WOUND VAC;  Surgeon: Recardo Evangelist, DPM;  Location: [facility] ORS;  Service: Podiatry;  Laterality: Left;   arm surgery     right     CHOLECYSTECTOMY     COLONOSCOPY     COLONOSCOPY WITH PROPOFOL N/A 01/11/2017   Procedure: COLONOSCOPY WITH PROPOFOL;  Surgeon: Christena Deem, MD;  Location: [facility] ENDOSCOPY;  Service: Endoscopy;  Laterality: N/A;   COLONOSCOPY WITH PROPOFOL N/A 02/22/2017   Procedure: COLONOSCOPY WITH PROPOFOL;  Surgeon: Christena Deem, MD;  Location: [facility] ENDOSCOPY;  Service: Endoscopy;  Laterality: N/A;   COLONOSCOPY WITH PROPOFOL N/A 05/31/2017   Procedure: COLONOSCOPY WITH PROPOFOL;  Surgeon: Christena Deem, MD;  Location: [facility] ENDOSCOPY;  Service: Endoscopy;  Laterality: N/A;   COLONOSCOPY WITH PROPOFOL N/A 01/26/2022   Procedure: COLONOSCOPY WITH PROPOFOL;  Surgeon: Regis Bill, MD;  Location: [facility] ENDOSCOPY;  Service: Endoscopy;  Laterality: N/A;   ESOPHAGOGASTRODUODENOSCOPY (EGD) WITH PROPOFOL N/A 01/11/2017   Procedure: ESOPHAGOGASTRODUODENOSCOPY (EGD) WITH PROPOFOL;  Surgeon: Christena Deem, MD;  Location: [facility] ENDOSCOPY;  Service: Endoscopy;  Laterality: N/A;   ESOPHAGOGASTRODUODENOSCOPY (EGD) WITH PROPOFOL N/A 10/25/2018   Procedure: ESOPHAGOGASTRODUODENOSCOPY (EGD) WITH PROPOFOL;  Surgeon: Christena Deem, MD;  Location: [facility] ENDOSCOPY;  Service: Endoscopy;  Laterality: N/A;   ESOPHAGOGASTRODUODENOSCOPY (EGD) WITH PROPOFOL N/A 11/29/2019   Procedure: ESOPHAGOGASTRODUODENOSCOPY (EGD) WITH PROPOFOL;  Surgeon: Toledo, Boykin Nearing, MD;  Location: [facility] ENDOSCOPY;  Service: Gastroenterology;  Laterality: N/A;   ESOPHAGOGASTRODUODENOSCOPY (EGD) WITH PROPOFOL  N/A 01/26/2022   Procedure: ESOPHAGOGASTRODUODENOSCOPY (EGD) WITH PROPOFOL;  Surgeon: Regis Bill, MD;  Location: [facility] ENDOSCOPY;  Service: Endoscopy;  Laterality: N/A;  DM   HERNIA REPAIR     umbilical   HIATAL HERNIA REPAIR     I & D EXTREMITY Left 06/27/2015   Procedure: IRRIGATION AND DEBRIDEMENT EXTREMITY            ( CALF HEMATOMA ) POSSIBLE WOUND VAC;  Surgeon: Annice Needy, MD;  Location: [facility] ORS;  Service: Vascular;  Laterality: Left;   INCISION AND DRAINAGE OF WOUND Left 09/25/2016   Procedure: IRRIGATION AND DEBRIDEMENT - PARTIAL RESECTION OF ACHILLES TENDON WITH WOUND VAC APPLICATION;  Surgeon: Recardo Evangelist, DPM;  Location: [facility] ORS;  Service: Podiatry;  Laterality: Left;  INCISION AND DRAINAGE OF WOUND Left 11/09/2018   Procedure: Excision of left foot wound with ACell placement;  Surgeon: Peggye Form, DO;  Location: [facility] OR;  Service: Plastics;  Laterality: Left;   IRRIGATION AND DEBRIDEMENT ABSCESS Left 09/07/2016   Procedure: IRRIGATION AND DEBRIDEMENT ABSCESS LEFT HEEL;  Surgeon: Recardo Evangelist, DPM;  Location: [facility] ORS;  Service: Podiatry;  Laterality: Left;   JOINT REPLACEMENT  2013   left knee replacement   KIDNEY SURGERY Right    kidney stones   LITHOTRIPSY     LOWER EXTREMITY ANGIOGRAPHY Left 04/04/2018   Procedure: LOWER EXTREMITY ANGIOGRAPHY;  Surgeon: Annice Needy, MD;  Location: [facility] INVASIVE CV LAB;  Service: Cardiovascular;  Laterality: Left;   NM GATED MYOVIEW [facility] HX)  February 2017   Likely breast attenuation. LOW RISK study. Normal EF 55-60%.   OTHER SURGICAL HISTORY     08/2016 or 09/2016 surgery on achilles tenso h/o staph infection heal. Dr. Orland Jarred   removal of left hematoma Left    leg   REVERSE SHOULDER ARTHROPLASTY Right 10/08/2015   Procedure: REVERSE SHOULDER ARTHROPLASTY;  Surgeon: Christena Flake, MD;  Location: [facility] ORS;  Service: Orthopedics;  Laterality: Right;   SLEEVE GASTROPLASTY     TONSILLECTOMY     TOTAL KNEE ARTHROPLASTY Left     TOTAL SHOULDER REPLACEMENT Right    TRANSESOPHAGEAL ECHOCARDIOGRAM  08/08/2013   Mild LVH, EF 60-65%. Moderate LA dilation and mild RA dilation. Mild MR with no evidence of stenosis and no evidence of endocarditis. A false chordae is noted.   TRANSTHORACIC ECHOCARDIOGRAM  08/03/2013   Mild-Moderate concentric LVH, EF 60-65%. Normal diastolic function. Mild LA dilation. Mild MR with possible vegetation  - not confirmed on TEE    ULNAR NERVE TRANSPOSITION Right 08/06/2016   Procedure: ULNAR NERVE DECOMPRESSION/TRANSPOSITION;  Surgeon: Christena Flake, MD;  Location: [facility] ORS;  Service: Orthopedics;  Laterality: Right;   UPPER GI ENDOSCOPY     VAGINAL HYSTERECTOMY      Social History   Socioeconomic History   Marital status: Married    Spouse name: Not on file   Number of children: Not on file   Years of education: Not on file   Highest education level: Not on file  Occupational History   Not on file  Tobacco Use   Smoking status: Never   Smokeless tobacco: Never  Vaping Use   Vaping Use: Never used  Substance and Sexual Activity   Alcohol use: No    Alcohol/week: 0.0 standard drinks of alcohol   Drug use: Never   Sexual activity: Not Currently  Other Topics Concern   Not on file  Social History Narrative   She is currently married -- for 30+ years. Does not work. Does not smoke or take alcohol. She never smoked. She exercises at least 3 days a week since before her gastric surgery.   Marital status reviewed in history of present illness.   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    Social Determinants of Health   Financial Resource Strain: Low Risk  (05/08/2020)   Overall Financial Resource Strain (CARDIA)    Difficulty of Paying Living Expenses: Not very hard  Food Insecurity: No Food Insecurity (05/08/2020)   Hunger Vital Sign    Worried About Running Out of Food in the Last Year: Never true    Ran Out of Food in the  Last Year:  Never true  Transportation Needs: Unmet Transportation Needs (05/07/2020)   PRAPARE - Administrator, Civil Service (Medical): Yes    Lack of Transportation (Non-Medical): Yes  Physical Activity: Not on file  Stress: Not on file  Social Connections: Not on file  Intimate Partner Violence: Not on file    Family History  Problem Relation Age of Onset   Skin cancer Father    Diabetes Father    Hypertension Father    Peripheral vascular disease Father    Cancer Father    Cerebral aneurysm Father    Alcohol abuse Father    Varicose Veins Mother    Kidney disease Mother    Arthritis Mother    Mental illness Sister    Cancer Maternal Aunt        breast   Breast cancer Maternal Aunt    Arthritis Maternal Grandmother    Hypertension Maternal Grandmother    Diabetes Maternal Grandmother    Arthritis Maternal Grandfather    Heart disease Maternal Grandfather    Hypertension Maternal Grandfather    Arthritis Paternal Grandmother    Hypertension Paternal Grandmother    Diabetes Paternal Grandmother    Arthritis Paternal Grandfather    Hypertension Paternal Grandfather    Bladder Cancer Neg Hx    Kidney cancer Neg Hx      Current Outpatient Medications:    acetaminophen (TYLENOL) 325 MG tablet, Take 2 tablets (650 mg total) by mouth every 6 (six) hours as needed for mild pain or moderate pain (or Fever >/= 101)., Disp: , Rfl:    albuterol (PROVENTIL) (2.5 MG/3ML) 0.083% nebulizer solution, Take 3 mLs (2.5 mg total) by nebulization every 4 (four) hours as needed for wheezing or shortness of breath., Disp: 75 mL, Rfl: 12   ALPRAZolam (XANAX) 0.25 MG tablet, Take 1 tablet (0.25 mg total) by mouth 2 (two) times daily as needed for anxiety. See SIG change was taking 1/2 of 0.5 tablet bid prn changed to 0.25 bid prn. D/c 0.5 xanax, Disp: 60 tablet, Rfl: 2   buPROPion (WELLBUTRIN XL) 150 MG 24 hr tablet, Take 1 tablet (150 mg total) by mouth daily., Disp: 90 tablet,  Rfl: 3   calcium-vitamin D (OSCAL WITH D) 500-200 MG-UNIT tablet, Take 1 tablet by mouth 2 (two) times daily with a meal., Disp: 60 tablet, Rfl: 0   cephALEXin (KEFLEX) 250 MG capsule, TAKE 1 CAPSULE BY MOUTH EVERY DAY, Disp: 30 capsule, Rfl: 0   cholecalciferol (VITAMIN D) 25 MCG tablet, Take 1 tablet (1,000 Units total) by mouth 2 (two) times daily with a meal., Disp: 30 tablet, Rfl: 2   clobetasol cream (TEMOVATE) 0.05 %, Apply 1 application topically 2 (two) times daily as needed (skin irritation). , Disp: , Rfl:    Continuous Blood Gluc Receiver (FREESTYLE LIBRE 3 READER) DEVI, 1 each by Does not apply route daily. Use as directed to check continuous blood glucose monitor., Disp: 1 each, Rfl: 0   Continuous Glucose Sensor (FREESTYLE LIBRE 3 SENSOR) MISC, , Disp: , Rfl:    diclofenac Sodium (VOLTAREN) 1 % GEL, Apply 2 g topically 3 (three) times daily., Disp: 100 g, Rfl: 2   DULoxetine (CYMBALTA) 60 MG capsule, Take 1 capsule (60 mg total) by mouth daily., Disp: 90 capsule, Rfl: 3   ELIQUIS 5 MG TABS tablet, TAKE 1 TABLET BY MOUTH TWICE DAILY, Disp: 180 tablet, Rfl: 1   empagliflozin (JARDIANCE) 25 MG TABS tablet, Take 1 tablet (25 mg total) by  mouth daily before breakfast., Disp: 90 tablet, Rfl: 3   enoxaparin (LOVENOX) 120 MG/0.8ML injection, Inject 0.8 mLs (120 mg total) into the skin every 12 (twelve) hours., Disp: 4 mL, Rfl: 0   ergocalciferol (VITAMIN D2) 1.25 MG (50000 UT) capsule, Take 50,000 Units by mouth once a week., Disp: , Rfl:    estradiol (ESTRACE) 0.1 MG/GM vaginal cream, Apply 0.5mg  (pea-sized amount)  just inside the vaginal introitus with a finger-tip on Monday, Wednesday and Friday nights,, Disp: 42.5 g, Rfl: 11   fluconazole (DIFLUCAN) 150 MG tablet, Take 1 tablet by mouth x 1 dose. May repeat in 72 hours if needed., Disp: 2 tablet, Rfl: 1   Fluticasone-Umeclidin-Vilant (TRELEGY ELLIPTA) 200-62.5-25 MCG/ACT AEPB, Inhale 1 puff into the lungs daily., Disp: 60 each, Rfl: 0    folic acid (FOLVITE) 1 MG tablet, Take 1 tablet (1 mg total) by mouth daily., Disp: 90 tablet, Rfl: 3   furosemide (LASIX) 40 MG tablet, Take 1 tablet (40 mg total) by mouth as directed. Take 1 tablet daily. May take additional 1 tablet If weight gain of greater than 2 pounds in 24 hours or 5 pounds in one week., Disp: 90 tablet, Rfl: 3   GEMTESA 75 MG TABS, TAKE 1 TABLET BY MOUTH DAILY, Disp: 30 tablet, Rfl: 11   HYDROcodone-acetaminophen (NORCO/VICODIN) 5-325 MG tablet, Take 1 tablet by mouth 2 (two) times daily as needed for severe pain. Must last 30 days, Disp: 45 tablet, Rfl: 0   doxycycline (VIBRA-TABS) 100 MG tablet, Take 1 tablet (100 mg total) by mouth 2 (two) times daily. (Patient not taking: Reported on 07/17/2022), Disp: 14 tablet, Rfl: 0   glucose blood (ONETOUCH ULTRA) test strip, TEST TWICE DAILY AS DIRECTED (Patient not taking: Reported on 07/17/2022), Disp: 100 each, Rfl: 11   [START ON 08/01/2022] HYDROcodone-acetaminophen (NORCO/VICODIN) 5-325 MG tablet, Take 1 tablet by mouth 2 (two) times daily as needed for severe pain. Must last 30 days, Disp: 45 tablet, Rfl: 0   Lancets (ONETOUCH ULTRASOFT) lancets, Use as instructed, Disp: 100 each, Rfl: 12   levocetirizine (XYZAL) 5 MG tablet, TAKE ONE TABLET BY MOUTH AT BEDTIME AS NEEDED FOR ALLERGIES, Disp: 90 tablet, Rfl: 3   levothyroxine (SYNTHROID) 137 MCG tablet, Take 1 tablet (137 mcg total) by mouth daily before breakfast. 30 minutes do not mix with vitamins or acid reflux medications D/c 150 mcg, Disp: 90 tablet, Rfl: 3   losartan (COZAAR) 50 MG tablet, TAKE 1 TABLET BY MOUTH DAILY, Disp: 90 tablet, Rfl: 3   metoprolol succinate (TOPROL-XL) 25 MG 24 hr tablet, Take 0.5 tablets (12.5 mg total) by mouth daily., Disp: 45 tablet, Rfl: 3   Misc. Devices (TUB TRANSFER BOARD) MISC, 1 Device by Does not apply route as needed., Disp: 1 each, Rfl: 0   montelukast (SINGULAIR) 10 MG tablet, Take 1 tablet (10 mg total) by mouth at bedtime., Disp: 90  tablet, Rfl: 3   Multiple Vitamin (MULTIVITAMIN WITH MINERALS) TABS tablet, Take 1 tablet by mouth daily., Disp: , Rfl:    mupirocin ointment (BACTROBAN) 2 %, APPLY TOPICALLY TWICE DAILY TO RIGHT LOWER LEG AS DIRECTED., Disp: 22 g, Rfl: 0   naloxone (NARCAN) nasal spray 4 mg/0.1 mL, Place 1 spray into the nose as needed for up to 365 doses (for opioid-induced respiratory depresssion). In case of emergency (overdose), spray once into each nostril. If no response within 3 minutes, repeat application and call 911., Disp: 1 each, Rfl: 0   nystatin cream (MYCOSTATIN), Apply  1 application. topically 2 (two) times daily. Angles of mouth or skin folds prn, Disp: 30 g, Rfl: 11   ondansetron (ZOFRAN) 4 MG tablet, Take 1 tablet (4 mg total) by mouth every 8 (eight) hours as needed for nausea or vomiting., Disp: 90 tablet, Rfl: 1   pantoprazole (PROTONIX) 40 MG tablet, Take 1 tablet (40 mg total) by mouth daily. 30 min before food, Disp: 90 tablet, Rfl: 3   polyethylene glycol (GAVILYTE-G) 236 g solution, , Disp: , Rfl:    Powders (GOLD BOND NO MESS BODY POWDER) AERP, Apply 1 application. topically daily as needed. Gold bond talc free powder, Disp: 200 g, Rfl: 11   pregabalin (LYRICA) 50 MG capsule, TAKE 1 CAPSULE BY MOUTH 3 TIMES DAILY, Disp: 90 capsule, Rfl: 2   rosuvastatin (CRESTOR) 20 MG tablet, TAKE 1 TABLET BY MOUTH DAILY, Disp: 90 tablet, Rfl: 1   Semaglutide, 1 MG/DOSE, (OZEMPIC, 1 MG/DOSE,) 4 MG/3ML SOPN, , Disp: , Rfl:    Semaglutide,0.25 or 0.5MG /DOS, (OZEMPIC, 0.25 OR 0.5 MG/DOSE,) 2 MG/3ML SOPN, , Disp: , Rfl:    sucralfate (CARAFATE) 1 GM/10ML suspension, Take 1 g by mouth 3 (three) times daily. , Disp: , Rfl:    thiamine 100 MG tablet, Take 0.5 tablets (50 mg total) by mouth 2 (two) times daily with a meal., Disp: 60 tablet, Rfl: 2   tirzepatide (MOUNJARO) 2.5 MG/0.5ML Pen, Inject 2.5 mg into the skin once a week., Disp: 2 mL, Rfl: 0   tirzepatide (MOUNJARO) 5 MG/0.5ML Pen, Inject 5 mg into the  skin once a week., Disp: 2 mL, Rfl: 0  Physical exam:  Vitals:   07/17/22 1421  BP: 132/79  Pulse: 95  Temp: (!) 96.6 F (35.9 C)  TempSrc: Tympanic  SpO2: 95%  Weight: 272 lb 4.8 oz (123.5 kg)  Height: 5\' 6"  (1.676 m)   Physical Exam Constitutional:      Comments: Sitting in a wheelchair.  Appears in no acute distress  Cardiovascular:     Rate and Rhythm: Normal rate and regular rhythm.     Heart sounds: Normal heart sounds.  Pulmonary:     Effort: Pulmonary effort is normal.     Breath sounds: Normal breath sounds.  Skin:    General: Skin is warm and dry.  Neurological:     Mental Status: She is alert and oriented to person, place, and time.         Latest Ref Rng & Units 02/06/2022    4:19 PM  CMP  Glucose 70 - 99 mg/dL 161   BUN 8 - 23 mg/dL 29   Creatinine 0.96 - 1.00 mg/dL 0.45   Sodium 409 - 811 mmol/L 140   Potassium 3.5 - 5.1 mmol/L 4.2   Chloride 98 - 111 mmol/L 103   CO2 22 - 32 mmol/L 27   Calcium 8.9 - 10.3 mg/dL 8.7       Latest Ref Rng & Units 07/17/2022    1:54 PM  CBC  WBC 4.0 - 10.5 K/uL 7.2   Hemoglobin 12.0 - 15.0 g/dL 91.4   Hematocrit 78.2 - 46.0 % 42.8   Platelets 150 - 400 K/uL 213      Assessment and plan- Patient is a 72 y.o. adult here for routine follow-up of Iron deficiency anemia  Patient last received IV iron in March 2023.  Then her hemoglobin was low at 8.3.  Presently her hemoglobin has normalized at 13.  Ferritin levels are mildly low at 28  but iron studies are within normal limits.  Iron saturation is 26%.  I think it would be okay to hold off IV iron at this time.  Repeat CBC ferritin and iron studies in 4 and 8 months and I will see her in 8 months   Visit Diagnosis 1. Iron deficiency anemia, unspecified iron deficiency anemia type      Dr. Owens Shark, MD, MPH [facility] at [facility] [phone] 07/19/2022 9:00 PM

## 2022-07-22 NOTE — Progress Notes (Unsigned)
[facility] Return Visit  SUBJECTIVE  History of Present Illness: [name] is a 72 y.o. female seen in follow-up for recurrent UTI, vaginal atrophy, and OAB. Plan at last visit was continue Gemtesa 75mg  daily, use estrogen x2 weekly and start low dose keflex for UTI prevention.     Past Medical History: Patient  has a past medical history of Abnormal antibody titer, Anxiety and depression, Arthritis, Asthma, Atypical chest pain, Carotid arterial disease (HCC), Chronic heart failure with preserved ejection fraction (HFpEF) (HCC), CKD (chronic kidney disease), stage III (HCC), Cystocele, Depression, Diabetes (HCC), DM (diabetes mellitus) type II, controlled, with peripheral vascular disorder (HCC) (08/31/2014), DVT (deep venous thrombosis) (HCC), Dyspnea, Edema, GERD (gastroesophageal reflux disease), Heart murmur, History of IBS, History of kidney stones, History of methicillin resistant staphylococcus aureus (MRSA) (2008), History of multiple strokes, Hyperlipidemia, Hypertension, Hypothyroidism, Insomnia, Kidney stone, Mitral regurgitation, Neuropathy involving both lower extremities, Non-healing ulcer of left foot (HCC), Obesity, Class II, BMI 35-39.9, with comorbidity, PAF (paroxysmal atrial fibrillation) (HCC), PAH (pulmonary artery hypertension) (HCC), Peripheral vascular disease (HCC), Sleep apnea, Stroke (HCC), Tricuspid regurgitation, Urinary incontinence, and Venous stasis dermatitis of both lower extremities.   Past Surgical History: She  has a past surgical history that includes Ankle surgery (Right); Vaginal hysterectomy; Sleeve Gastroplasty; Lithotripsy; Kidney surgery (Right); transthoracic echocardiogram (08/03/2013); transesophageal echocardiogram (08/08/2013); Tonsillectomy; Total knee arthroplasty (Left); Hiatal hernia repair; Cholecystectomy; I & D extremity (Left, 06/27/2015); Application if wound vac (Left, 06/27/2015); removal of left hematoma (Left); NM GATED  MYOVIEW [facility] HX) (February 2017); Reverse shoulder arthroplasty (Right, 10/08/2015); Hernia repair; Ulnar nerve transposition (Right, 08/06/2016); arm surgery; Irrigation and debridement abscess (Left, 09/07/2016); Incision and drainage of wound (Left, 09/25/2016); Application if wound vac (Left, 09/25/2016); Colonoscopy; Upper gi endoscopy; Esophagogastroduodenoscopy (egd) with propofol (N/A, 01/11/2017); Colonoscopy with propofol (N/A, 01/11/2017); Colonoscopy with propofol (N/A, 02/22/2017); OTHER SURGICAL HISTORY; Total shoulder replacement (Right); Joint replacement (2013); Colonoscopy with propofol (N/A, 05/31/2017); Lower Extremity Angiography (Left, 04/04/2018); Esophagogastroduodenoscopy (egd) with propofol (N/A, 10/25/2018); Incision and drainage of wound (Left, 11/09/2018); Esophagogastroduodenoscopy (egd) with propofol (N/A, 11/29/2019); Esophagogastroduodenoscopy (egd) with propofol (N/A, 01/26/2022); and Colonoscopy with propofol (N/A, 01/26/2022).   Medications: She has a current medication list which includes the following prescription(s): acetaminophen, albuterol, alprazolam, bupropion, calcium-vitamin d, vitamin d3, clobetasol cream, freestyle libre 3 reader, freestyle libre 3 sensor, diclofenac sodium, doxycycline, duloxetine, eliquis, empagliflozin, enoxaparin, ergocalciferol, estradiol, fluconazole, trelegy ellipta, folic acid, onetouch ultra, [START ON 08/01/2022] hydrocodone-acetaminophen, onetouch ultrasoft, levocetirizine, levothyroxine, losartan, metoprolol succinate, tub transfer board, montelukast, multivitamin with minerals, mupirocin ointment, naloxone, nystatin cream, ondansetron, pantoprazole, gavilyte-g, gold bond no mess body powder, pregabalin, rosuvastatin, ozempic (1 mg/dose), sucralfate, thiamine, tirzepatide, mounjaro, cephalexin, furosemide, hydrocodone-acetaminophen, and gemtesa.   Allergies: Patient is allergic to morphine and related, penicillins, ambien [zolpidem], aspirin, and  oxytetracycline.   Social History: Patient  reports that she has never smoked. She has never used smokeless tobacco. She reports that she does not drink alcohol and does not use drugs.      OBJECTIVE     Physical Exam: Vitals:   07/23/22 1419  BP: (!) 108/59  Pulse: (!) 53   Gen: No apparent distress, A&O x 3.  Detailed Urogynecologic Evaluation:  Deferred.    ASSESSMENT AND PLAN    [name] is a 72 y.o. with:  1. Recurrent UTI   2. Leukocytes in urine    Patient has done really well on the low dose Keflex daily and would like to stay on it for  another 6 months. She reports she has had no UTI symptoms and is doing very well. Will continue for 6 months and then consider coming off the medication and switching to Methenamine. Will discuss this at next appointment with patient.  Patient reports she wants to continue on Gemtesa 75mg  daily. Refills sent to pharmacy.   Patient to follow up in 6 months or sooner if needed.

## 2022-07-23 ENCOUNTER — Ambulatory Visit (INDEPENDENT_AMBULATORY_CARE_PROVIDER_SITE_OTHER): Payer: Medicare HMO | Admitting: Obstetrics and Gynecology

## 2022-07-23 ENCOUNTER — Encounter: Payer: Self-pay | Admitting: Obstetrics and Gynecology

## 2022-07-23 VITALS — BP 108/59 | HR 53

## 2022-07-23 DIAGNOSIS — N390 Urinary tract infection, site not specified: Secondary | ICD-10-CM

## 2022-07-23 DIAGNOSIS — N3281 Overactive bladder: Secondary | ICD-10-CM

## 2022-07-23 MED ORDER — CEPHALEXIN 250 MG PO CAPS
250.0000 mg | ORAL_CAPSULE | Freq: Every day | ORAL | 1 refills | Status: DC
Start: 2022-07-23 — End: 2023-02-15

## 2022-07-23 MED ORDER — GEMTESA 75 MG PO TABS: 1.0000 | ORAL_TABLET | Freq: Every day | ORAL | 11 refills | Status: DC

## 2022-07-23 NOTE — Patient Instructions (Signed)
Continue preventative low dose antibiotic  Good Clean love brand lubrication  Vitamin ecream and coconut oil can also be helpful and are safe for the vaginal tract.

## 2022-07-24 ENCOUNTER — Other Ambulatory Visit: Payer: Self-pay | Admitting: Orthopedic Surgery

## 2022-07-24 ENCOUNTER — Telehealth: Payer: Self-pay | Admitting: Cardiovascular Disease

## 2022-07-24 DIAGNOSIS — M19012 Primary osteoarthritis, left shoulder: Secondary | ICD-10-CM

## 2022-07-24 NOTE — Telephone Encounter (Signed)
Pt has appt 07/31/22 with Cadence Fransico Michael, PAC. I have added pre op clearance needed to appt notes. I will update all parties involved.

## 2022-07-24 NOTE — Telephone Encounter (Signed)
   Pre-operative Risk Assessment    Patient Name: [name]  DOB: 1950/06/15 MRN: [id number]      Request for Surgical Clearance    Procedure:   Left reverse shoulder arthroplasty, biceps tenodesis  Date of Surgery:  Clearance 09/01/22                                 Surgeon:  Dr Signa Kell Surgeon's Group or Practice Name:  [facility] Phone number:  [phone] Fax number:  [phone]   Type of Clearance Requested:   - Medical    Type of Anesthesia:  Not Indicated   Additional requests/questions:    Courtney Heys   07/24/2022, 12:56 PM

## 2022-07-24 NOTE — Telephone Encounter (Signed)
   Name: [name]  DOB: 1950/05/21  MRN: [id number]  Primary Cardiologist: Lorine Bears, MD   Preoperative team, please contact this patient and set up a phone call appointment for further preoperative risk assessment. Please obtain consent and complete medication review. Thank you for your help.  I confirm that guidance regarding antiplatelet and oral anticoagulation therapy has been completed and, if necessary, noted below.  None requested.   Ronney Asters, NP 07/24/2022, 1:13 PM [facility]

## 2022-07-27 ENCOUNTER — Other Ambulatory Visit (HOSPITAL_COMMUNITY): Payer: Self-pay

## 2022-07-27 MED ORDER — TIRZEPATIDE 7.5 MG/0.5ML ~~LOC~~ SOAJ: 7.5000 mg | SUBCUTANEOUS | 0 refills | Status: DC

## 2022-07-27 NOTE — Telephone Encounter (Signed)
Pt called stating the 7.5mg  [facility] is at [facility]

## 2022-07-27 NOTE — Telephone Encounter (Signed)
Per test claim, PA not needed for [employer]. No PA submitted at this time.

## 2022-07-27 NOTE — Telephone Encounter (Signed)
Pt should have 1 more week left of the 5 mg, 7.5 mg has been sent to [facility] for pt to start once completing the 5 mg

## 2022-07-27 NOTE — Addendum Note (Signed)
Addended by: Donavan Foil on: 07/27/2022 09:03 AM   Modules accepted: Orders

## 2022-07-28 ENCOUNTER — Other Ambulatory Visit (HOSPITAL_COMMUNITY): Payer: Self-pay

## 2022-07-28 NOTE — Telephone Encounter (Signed)
Called insurance to submit PA, Pt is not currently on insulin and does not have any record of hypoglycemic events. I asked how pt has already been approved for sensors if she doesn't meet the criteria for the reader and the rep Melanee Spry) explained that the sensors do not require a PA but the reader does. While this does not make sense to me, she will not be approved for the reader based on these criteria.

## 2022-07-28 NOTE — Telephone Encounter (Signed)
Pt has been notified, pt verbalized understanding, pt stated she believes the mounjaro is helping a a lot she sees her numbers going down pt is finishing her last 5 mg next week and will be picking up the 7.5 mg soon

## 2022-07-29 DIAGNOSIS — I10 Essential (primary) hypertension: Secondary | ICD-10-CM | POA: Diagnosis not present

## 2022-07-29 DIAGNOSIS — F32A Depression, unspecified: Secondary | ICD-10-CM | POA: Diagnosis not present

## 2022-07-29 DIAGNOSIS — G4700 Insomnia, unspecified: Secondary | ICD-10-CM | POA: Diagnosis not present

## 2022-07-29 DIAGNOSIS — Z8673 Personal history of transient ischemic attack (TIA), and cerebral infarction without residual deficits: Secondary | ICD-10-CM | POA: Diagnosis not present

## 2022-07-29 DIAGNOSIS — G4733 Obstructive sleep apnea (adult) (pediatric): Secondary | ICD-10-CM | POA: Diagnosis not present

## 2022-07-29 DIAGNOSIS — R0681 Apnea, not elsewhere classified: Secondary | ICD-10-CM | POA: Diagnosis not present

## 2022-07-29 DIAGNOSIS — F419 Anxiety disorder, unspecified: Secondary | ICD-10-CM | POA: Diagnosis not present

## 2022-07-30 ENCOUNTER — Encounter: Payer: Self-pay | Admitting: Pulmonary Disease

## 2022-07-30 ENCOUNTER — Ambulatory Visit: Payer: Medicare HMO | Admitting: Pulmonary Disease

## 2022-07-30 VITALS — BP 112/70 | HR 68 | Temp 98.0°F | Ht 66.0 in | Wt 270.0 lb

## 2022-07-30 DIAGNOSIS — Z6841 Body Mass Index (BMI) 40.0 and over, adult: Secondary | ICD-10-CM

## 2022-07-30 DIAGNOSIS — G4733 Obstructive sleep apnea (adult) (pediatric): Secondary | ICD-10-CM | POA: Diagnosis not present

## 2022-07-30 DIAGNOSIS — J986 Disorders of diaphragm: Secondary | ICD-10-CM | POA: Diagnosis not present

## 2022-07-30 DIAGNOSIS — Z01811 Encounter for preprocedural respiratory examination: Secondary | ICD-10-CM

## 2022-07-30 DIAGNOSIS — J4540 Moderate persistent asthma, uncomplicated: Secondary | ICD-10-CM | POA: Diagnosis not present

## 2022-07-30 MED ORDER — ALBUTEROL SULFATE HFA 108 (90 BASE) MCG/ACT IN AERS
2.0000 | INHALATION_SPRAY | Freq: Four times a day (QID) | RESPIRATORY_TRACT | 2 refills | Status: DC | PRN
Start: 2022-07-30 — End: 2022-10-20

## 2022-07-30 NOTE — Patient Instructions (Addendum)
We are going to expedite breathing test which are very important for me to determine further risk assessment for you.  We will then add any further comments for the risk assessment on your surgery once the studies are done.  I have sent a prescription for your albuterol to your pharmacy.  Continue taking your Trelegy Ellipta.  Continue using your CPAP as you are doing.  Continue your Acapella (pickle) use.  We will see you in follow-up in 2 to 3 months time call sooner should any new problems arise.

## 2022-07-30 NOTE — Progress Notes (Addendum)
Subjective:    Patient ID: [name], adult    DOB: 16-Nov-1950, 72 y.o.   MRN: [id number] Patient Care Team: [name], NP as PCP - General (Nurse Practitioner) Iran Ouch, MD as PCP - Cardiology (Cardiology) Delano Metz, MD as Consulting Physician (Pain Medicine) Marykay Lex, MD as Consulting Physician (Cardiology) Poggi, Excell Seltzer, MD as Consulting Physician (Surgery) Creig Hines, MD as Consulting Physician (Oncology) Signa Kell, MD as Consulting Physician (Orthopedic Surgery)  Chief Complaint  Patient presents with   Follow-up    No SOB. Occasional wheezing. Cough with white/yellow sputum. CPAP machine is doing great! No problems with mask or pressure.   HPI Patient is a 72 year old lifelong never smoker who carries a diagnosis of asthmatic bronchitis who presents for overdue follow-up and for preoperative pulmonary risk assessment.  I last saw the patient on 29 April 2021 and requested at that time that she had PFTs performed as she had not had PFTs since 2020.  We needed to reassess her respiratory status.  The patient was also asked to follow-up in 3 to 4 weeks and she never did.  In any event she is now referred to [place] by Dr. Signa Kell for preoperative assessment of proposed reverse shoulder replacement on the left.  The patient is maintained on Trelegy Ellipta 201 inhalation daily and as needed albuterol.  Previously she had been provided with an Acapella flutter device for secretion management and she states that she uses this on a daily basis.  This has helped with her tenacious secretions.  She was noted to be fairly well compensated at her last visit with me however, it was stressed that we needed to reassess her lung function and as noted she never did.  Prior PFTs were not consistent with obstructive lung disease but rather with restrictive physiology on the basis of obesity and right hemidiaphragm elevation.  Her BMI currently is 43.5.  She had  a reverse shoulder replacement on the right on 08 October 2015 and she had to be admitted for several days after that due to respiratory distress and pneumonia after the procedure.  Also resulted on the patient's right hemidiaphragm elevation likely due to the block utilized for the shoulder procedure.  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  This also adds to her restrictive physiology.  The patient has noted just only occasional wheezing.  No shortness of breath over baseline however she is not overly active. She has occasional cough with whitish sputum production.  He has not had any recent asthma/asthmatic bronchitis related issues.  She is compliant with CPAP for her sleep apnea and notes that she sleeps well with the device.  Compliance report shows 100% usage over 30 days average of 10 hours and 38 minutes of use per day.  Pap is set at 11 cm H2O residual AHI 1.2.  Her only concern today is that she would like to have a refill for her albuterol rescue inhaler.  DATA 12/18/2015 sleep study: Obstructive sleep apnea confirmed.  AHI 13.0 baseline and 37.5 during REM sleep. 09/22/2018 PFTs: FEV1 1.42 L or 67% predicted, FVC 1. 77 L or 63% predicted, FEV1/FVC 80%, no bronchodilator response.  Lung volumes mildly reduced, ERV greatly reduced at 12% consistent with obesity.  Diffusion capacity moderately reduced.  No overt obstruction noted, mild/moderate restriction likely due to obesity, known hemidiaphragm elevation. 09/03/2019 echocardiogram: LVEF 60 to 65%, no wall motion  abnormality, no evidence of mitral valve regurgitation. 04/07/2020 echocardiogram with bubble study: LVEF 60 to 65%, no wall motion abnormalities.  Mild mitral valve regurgitation.  Mild to moderate tricuspid valve regurgitation.  Negative saline contrast study 06/03/2021 echocardiogram: LVEF 55 to 60%, no wall motion abnormalities, moderately elevated pulmonary artery systolic  pressure.  Will size dilated.  Mild mitral regurgitation.  Moderate tricuspid valve regurgitation.  Right atrial pressure estimated at 8 mmHg.  Review of Systems A 10 point review of systems was performed and it is as noted above otherwise negative.  Patient Active Problem List   Diagnosis Date Noted   Yeast vaginitis 07/16/2022   Type 2 diabetes mellitus with hyperglycemia, without long-term current use of insulin (HCC) 06/16/2022   Bronchitis 03/26/2022   BMI 40.0-44.9, adult (HCC) 12/01/2021   Stage 3b chronic kidney disease (HCC) 12/01/2021   Dysuria 11/13/2021   Abnormal MRI, shoulder 10/29/2021   SOB (shortness of breath) 06/06/2021   Iron deficiency anemia 03/27/2021   Recurrent strokes (HCC) 02/26/2021   Chronic shoulder pain (Bilateral) 02/10/2021   Impaired range of motion of shoulder (Left) 02/10/2021   Hyperlipidemia LDL goal <70 01/28/2021   Allergic rhinitis 08/22/2020   Uncomplicated opioid dependence (HCC) 07/15/2020   Cognitive deficit, post-stroke 06/22/2020   Peripheral neurogenic pain 06/20/2020   Chronic peripheral neuropathic pain 06/20/2020   Chronic use of opiate for therapeutic purpose 06/20/2020   History of cerebrovascular accident (CVA) due to embolism 06/19/2020   History of stroke with current residual effects 06/05/2020   ANA positive 04/18/2020   Nonsustained ventricular tachycardia (HCC) 04/09/2020   B12 deficiency 03/11/2020   Ulcer of left heel and midfoot (HCC) 03/11/2020   PAD (peripheral artery disease) (HCC) 03/11/2020   Rotator cuff disorder, left 03/01/2020   Cerebrovascular accident (CVA) (HCC) 03/01/2020   Primary osteoarthritis of left shoulder 02/12/2020   Folate deficiency 02/08/2020   Vitamin D deficiency 02/08/2020   Malnutrition of moderate degree 02/06/2020   Left hemiparesis (HCC) 12/28/2019   Chronic bacteriuria 11/17/2019   Hyperkalemia 09/14/2019   Bacteremia due to Gram-positive bacteria 09/08/2019   Pressure injury of  skin 09/04/2019   COPD with acute exacerbation (HCC) 09/02/2019   Pharmacologic therapy 08/30/2019   Recurrent falls 07/10/2019   Left ankle pain 06/02/2019   Chronic pain of right ankle 02/14/2019   Arthritis 02/14/2019   Meralgia paresthetica (Right) 12/05/2018   Chronic anticoagulation (Eliquis) 12/05/2018   Chronic ulcer of right leg (HCC) 10/18/2018   Fibromyalgia syndrome 08/17/2018   Chronic musculoskeletal pain 08/17/2018   Tremor of right hand 08/03/2018   Overactive bladder 08/03/2018   Physical deconditioning 08/03/2018   Recurrent UTI 07/29/2018   Foot ulcer, left (HCC) 03/09/2018   Adrenal mass (HCC) 08/02/2017   Eczema 06/27/2017   Mass of both adrenal glands (HCC) 05/16/2017   Fatty liver 05/13/2017   Hip strain (Right) 04/30/2017   Ischial pain, right 04/30/2017   Hip joint pain (Left) 04/22/2017   Abnormality of gait and mobility 04/22/2017   Muscle strain of hip (Left) 04/09/2017   Pain in joint involving ankle and foot 11/18/2016   Kidney stones 11/18/2016   Spinal stenosis of thoracic region 11/18/2016   Abscess of tendon of left foot 09/21/2016   Cubital tunnel syndrome (Right) 08/07/2016   Chronic pain syndrome 02/25/2016   Long term current use of opiate analgesic 02/25/2016   Lymphedema 12/26/2015   Status post reverse total shoulder replacement 10/08/2015   Neuropathy of lateral femoral cutaneous nerve (Right)  09/23/2015   Obesity, Class III, BMI 40-49.9 (morbid obesity) (HCC) 09/23/2015   Chronic prescription benzodiazepine use 09/23/2015   Arthralgia 09/23/2015   Osteoarthritis involving multiple joints 09/23/2015   Other shoulder lesions (Right) 08/28/2015   Cervical radiculitis 08/06/2015   Opiate use (15 MME/Day) 01/16/2015   Impingement syndrome of shoulder region 01/07/2015   Mixed incontinence 11/26/2014   Atrophic vaginitis 11/26/2014   Depression with anxiety 09/21/2014   Bladder cystocele 09/21/2014   Gastro-esophageal reflux disease  without esophagitis 09/21/2014   At high risk for falls 09/21/2014   Diabetic peripheral neuropathy associated with type 2 diabetes mellitus (HCC) 08/31/2014   Asthma, well controlled 08/31/2014   Hypothyroidism 08/31/2014   Peripheral vascular disease due to secondary diabetes mellitus (HCC) 12/11/2013   OSA (obstructive sleep apnea) 10/19/2013   Essential hypertension 07/27/2013   Chronic atrial fibrillation 08/10/2012   Social History   Tobacco Use   Smoking status: Never   Smokeless tobacco: Never  Substance Use Topics   Alcohol use: No    Alcohol/week: 0.0 standard drinks of alcohol   Allergies  Allergen Reactions   Morphine And Related Other (See Comments) and Nausea Only    Patient becomes very confused Other reaction(s): Other (See Comments), Other (See Comments) Patient becomes very confused Patient becomes very confused    Penicillins Hives, Shortness Of Breath, Swelling and Other (See Comments)    Facial swelling Has patient had a PCN reaction causing immediate rash, facial/tongue/throat swelling, SOB or lightheadedness with hypotension: Yes Has patient had a PCN reaction causing severe rash involving mucus membranes or skin necrosis: Yes Has patient had a PCN reaction that required hospitalization No Has patient had a PCN reaction occurring within the last 10 years: No If all of the above answers are "NO", then may proceed with Cephalosporin use.  Other reaction(s): Other (See Comments), Other (See Comments) Facial swelling Has patient had a PCN reaction causing immediate rash, facial/tongue/throat swelling, SOB or lightheadedness with hypotension: Yes Has patient had a PCN reaction causing severe rash involving mucus membranes or skin necrosis: Yes Has patient had a PCN reaction that required hospitalization No Has patient had a PCN reaction occurring within the last 10 years: No If all of the above answers are "NO", then may proceed with Cephalosporin  use. Facial swelling Has patient had a PCN reaction causing immediate rash, facial/tongue/throat swelling, SOB or lightheadedness with hypotension: Yes Has patient had a PCN reaction causing severe rash involving mucus membranes or skin necrosis: Yes Has patient had a PCN reaction that required hospitalization No Has patient had a PCN reaction occurring within the last 10 years: No If all of the above answers are "NO", then may proceed with Cephalosporin use. Facial swelling Has patient had a PCN reaction causing immediate rash, facial/tongue/throat swelling, SOB or lightheadedness with hypotension: Yes Has patient had a PCN reaction causing severe rash involving mucus membranes or skin necrosis: Yes Has patient had a PCN reaction that required hospitalization No Has patient had a PCN reaction occurring within the last 10 years: No If all of the above answers are "NO", then may proceed with Cephalosporin use.    Ambien [Zolpidem]     Hallucination    Aspirin Hives   Oxytetracycline Hives    Other reaction(s): Unknown    Current Meds  Medication Sig   acetaminophen (TYLENOL) 325 MG tablet Take 2 tablets (650 mg total) by mouth every 6 (six) hours as needed for mild pain or moderate pain (or Fever >/=  101).   albuterol (PROVENTIL) (2.5 MG/3ML) 0.083% nebulizer solution Take 3 mLs (2.5 mg total) by nebulization every 4 (four) hours as needed for wheezing or shortness of breath.   albuterol (VENTOLIN HFA) 108 (90 Base) MCG/ACT inhaler Inhale 2 puffs into the lungs every 6 (six) hours as needed.   ALPRAZolam (XANAX) 0.25 MG tablet Take 1 tablet (0.25 mg total) by mouth 2 (two) times daily as needed for anxiety. See SIG change was taking 1/2 of 0.5 tablet bid prn changed to 0.25 bid prn. D/c 0.5 xanax   buPROPion (WELLBUTRIN XL) 150 MG 24 hr tablet Take 1 tablet (150 mg total) by mouth daily.   calcium-vitamin D (OSCAL WITH D) 500-200 MG-UNIT tablet Take 1 tablet by mouth 2 (two) times daily  with a meal.   cephALEXin (KEFLEX) 250 MG capsule Take 1 capsule (250 mg total) by mouth daily.   cholecalciferol (VITAMIN D) 25 MCG tablet Take 1 tablet (1,000 Units total) by mouth 2 (two) times daily with a meal.   clobetasol cream (TEMOVATE) 0.05 % Apply 1 application topically 2 (two) times daily as needed (skin irritation).    Continuous Blood Gluc Receiver (FREESTYLE LIBRE 3 READER) DEVI 1 each by Does not apply route daily. Use as directed to check continuous blood glucose monitor.   Continuous Glucose Sensor (FREESTYLE LIBRE 3 SENSOR) MISC    diclofenac Sodium (VOLTAREN) 1 % GEL Apply 2 g topically 3 (three) times daily.   DULoxetine (CYMBALTA) 60 MG capsule Take 1 capsule (60 mg total) by mouth daily.   ELIQUIS 5 MG TABS tablet TAKE 1 TABLET BY MOUTH TWICE DAILY   empagliflozin (JARDIANCE) 25 MG TABS tablet Take 1 tablet (25 mg total) by mouth daily before breakfast.   enoxaparin (LOVENOX) 120 MG/0.8ML injection Inject 0.8 mLs (120 mg total) into the skin every 12 (twelve) hours.   ergocalciferol (VITAMIN D2) 1.25 MG (50000 UT) capsule Take 50,000 Units by mouth once a week.   estradiol (ESTRACE) 0.1 MG/GM vaginal cream Apply 0.5mg  (pea-sized amount)  just inside the vaginal introitus with a finger-tip on Monday, Wednesday and Friday nights,   fluconazole (DIFLUCAN) 150 MG tablet Take 1 tablet by mouth x 1 dose. May repeat in 72 hours if needed.   Fluticasone-Umeclidin-Vilant (TRELEGY ELLIPTA) 200-62.5-25 MCG/ACT AEPB Inhale 1 puff into the lungs daily.   folic acid (FOLVITE) 1 MG tablet Take 1 tablet (1 mg total) by mouth daily.   glucose blood (ONETOUCH ULTRA) test strip TEST TWICE DAILY AS DIRECTED   [START ON 08/01/2022] HYDROcodone-acetaminophen (NORCO/VICODIN) 5-325 MG tablet Take 1 tablet by mouth 2 (two) times daily as needed for severe pain. Must last 30 days   Lancets (ONETOUCH ULTRASOFT) lancets Use as instructed   levocetirizine (XYZAL) 5 MG tablet TAKE ONE TABLET BY MOUTH AT  BEDTIME AS NEEDED FOR ALLERGIES   levothyroxine (SYNTHROID) 137 MCG tablet Take 1 tablet (137 mcg total) by mouth daily before breakfast. 30 minutes do not mix with vitamins or acid reflux medications D/c 150 mcg   losartan (COZAAR) 50 MG tablet TAKE 1 TABLET BY MOUTH DAILY   metoprolol succinate (TOPROL-XL) 25 MG 24 hr tablet Take 0.5 tablets (12.5 mg total) by mouth daily.   Misc. Devices (TUB TRANSFER BOARD) MISC 1 Device by Does not apply route as needed.   montelukast (SINGULAIR) 10 MG tablet Take 1 tablet (10 mg total) by mouth at bedtime.   Multiple Vitamin (MULTIVITAMIN WITH MINERALS) TABS tablet Take 1 tablet by mouth daily.  mupirocin ointment (BACTROBAN) 2 % APPLY TOPICALLY TWICE DAILY TO RIGHT LOWER LEG AS DIRECTED.   naloxone (NARCAN) nasal spray 4 mg/0.1 mL Place 1 spray into the nose as needed for up to 365 doses (for opioid-induced respiratory depresssion). In case of emergency (overdose), spray once into each nostril. If no response within 3 minutes, repeat application and call 911.   nystatin cream (MYCOSTATIN) Apply 1 application. topically 2 (two) times daily. Angles of mouth or skin folds prn   ondansetron (ZOFRAN) 4 MG tablet Take 1 tablet (4 mg total) by mouth every 8 (eight) hours as needed for nausea or vomiting.   pantoprazole (PROTONIX) 40 MG tablet Take 1 tablet (40 mg total) by mouth daily. 30 min before food   polyethylene glycol (GAVILYTE-G) 236 g solution    Powders (GOLD BOND NO MESS BODY POWDER) AERP Apply 1 application. topically daily as needed. Gold bond talc free powder   pregabalin (LYRICA) 50 MG capsule TAKE 1 CAPSULE BY MOUTH 3 TIMES DAILY   rosuvastatin (CRESTOR) 20 MG tablet TAKE 1 TABLET BY MOUTH DAILY   sucralfate (CARAFATE) 1 GM/10ML suspension Take 1 g by mouth 3 (three) times daily.    thiamine 100 MG tablet Take 0.5 tablets (50 mg total) by mouth 2 (two) times daily with a meal.   tirzepatide (MOUNJARO) 5 MG/0.5ML Pen Inject 5 mg into the skin once  a week.   tirzepatide (MOUNJARO) 7.5 MG/0.5ML Pen Inject 7.5 mg into the skin once a week.   Vibegron (GEMTESA) 75 MG TABS Take 1 tablet (75 mg total) by mouth daily.   Immunization History  Administered Date(s) Administered   Fluad Quad(high Dose 65+) 02/07/2020, 02/06/2021, 11/27/2021   Influenza, High Dose Seasonal PF 11/28/2014, 02/02/2018, 01/31/2019, 02/20/2019   Influenza, Seasonal, Injecte, Preservative Fre 01/17/2011   Influenza,inj,Quad PF,6+ Mos 12/18/2013   Influenza-Unspecified 12/18/2013, 12/31/2015, 11/27/2016, 02/02/2018   PFIZER(Purple Top)SARS-COV-2 Vaccination 06/29/2019, 07/29/2019, 01/22/2020   Pneumococcal Conjugate-13 11/27/2016   Pneumococcal Polysaccharide-23 01/17/2011, 12/17/2014   Tdap 03/09/2018       Objective:   Physical Exam BP 112/70 (BP Location: Right Arm, Cuff Size: Large)   Pulse 68   Temp 98 F (36.7 C)   Ht 5\' 6"  (1.676 m)   Wt 270 lb (122.5 kg) Comment: per patient in a wheelchair today  SpO2 97%   BMI 43.58 kg/m   SpO2: 97 % O2 Device: None (Room air)  GENERAL: Morbidly obese woman, presents in transport chair, normally uses walker.  No respiratory distress.  No conversational dyspnea. HEAD: Normocephalic, atraumatic.  EYES: Pupils equal, round, reactive to light.  No scleral icterus.  MOUTH: Dentition shows few chipped teeth.  Oral mucosa moist.  No thrush.  Mallampati class III. NECK: Supple. No thyromegaly. Trachea midline. No JVD.  No adenopathy. PULMONARY: Good air entry bilaterally, decreased breath sounds on the right base (chronic).  Breath sounds coarse with no other adventitious sounds, no wheezing. CARDIOVASCULAR: S1 and S2. Regular rate and rhythm.  No rubs, murmurs or gallops heard. ABDOMEN: Protuberant, obese, otherwise benign. MUSCULOSKELETAL: No joint deformity, no clubbing, lower extremity edema 2+ has compression stockings.  NEUROLOGIC: Mild left sided weakness from prior CVA, no change, gait not tested patient on  transport chair SKIN: Intact,warm,dry. PSYCH: Mood and behavior normal.      Assessment & Plan:     ICD-10-CM   1. Moderate persistent asthma without complication  J45.40 Pulmonary Function Test [facility] Only   Clinically, well compensated Continue Trelegy 200 Continue as  needed albuterol Reassess with PFTs    2. Chronically elevated hemidiaphragm - RIGHT  J98.6 Pulmonary Function Test [facility] Only   This issue adds complexity to her management Adds to her restrictive physiology As compensated as she is to get    3. OSA (obstructive sleep apnea)  G47.33    Compliant with CPAP at 11 cm H2O This issue adds to complexity of postop care Continue CPAP postop    4. Morbid obesity with BMI of 40.0-44.9, adult (HCC)  E66.01    Z68.41    This issue adds complexity to her management As to her restrictive physiology Will increase chances for atelectasis/pneumonia postop    5. Preoperative respiratory examination  Z01.811    PFTs reordered Patient to have PFTs 21 May Overall high risk for postoperative complications from proposed procedure     Orders Placed This Encounter  Procedures   Pulmonary Function Test [facility] Only    Standing Status:   Future    Standing Expiration Date:   07/30/2023    Order Specific Question:   Full PFT: includes the following: basic spirometry, spirometry pre & post bronchodilator, diffusion capacity (DLCO), lung volumes    Answer:   Full PFT    Order Specific Question:   This test can only be performed at    Answer:   [facility]   Meds ordered this encounter  Medications   albuterol (VENTOLIN HFA) 108 (90 Base) MCG/ACT inhaler    Sig: Inhale 2 puffs into the lungs every 6 (six) hours as needed.    Dispense:  8 g    Refill:  2   Discussion: I had a frank discussion with the patient and her husband who accompanied her today.  The patient is a high risk for potential complications from shoulder surgery and unfortunately the main issues that increase her  risk cannot be further optimized.  These issues include right hemidiaphragm paresis, morbid obesity, deconditioning, and sleep apnea.  The main issues here will be atelectasis and high risk for infection postop particularly pneumonia.  Asthma/asthmatic bronchitis is actually the least of her issues currently as she appears to be well-controlled.  However, she has not had reassessment of pulmonary function as previously ordered.  She will have repeat PFTs on 21 May and we will add further comments to preoperative assessment after that testing is done.  Recommend against interscalene nerve block given the complication of right hemidiaphragm paresthesias after her shoulder arthroplasty in 2017.  The patient will be followed up in 2 to 3 months time she is to call sooner should any new problems arise.  Gailen Shelter, MD Advanced Bronchoscopy PCCM [facility]    *This note was dictated using voice recognition software/Dragon.  Despite best efforts to proofread, errors can occur which can change the meaning. Any transcriptional errors that result from this process are unintentional and may not be fully corrected at the time of dictation.   Addendum 14 Aug 2022: Recent Results (from the past 2160 hour(s))  POCT glucose (manual entry)     Status: Abnormal   Collection Time: 06/12/22  4:29 PM  Result Value Ref Range   POC Glucose 174 (A) 70 - 99 mg/dl  Iron and TIBC     Status: None   Collection Time: 07/17/22  1:54 PM  Result Value Ref Range   Iron 107 28 - 170 ug/dL   TIBC 756 433 - 295 ug/dL   Saturation Ratios 26 10.4 - 31.8 %  UIBC 310 ug/dL    Comment: Performed at [facility], [address] [place], [place] [address]  Ferritin     Status: None   Collection Time: 07/17/22  1:54 PM  Result Value Ref Range   Ferritin 28 11 - 307 ng/mL    Comment: Performed at [facility], [address] [place], [place] [address]  CBC     Status: None    Collection Time: 07/17/22  1:54 PM  Result Value Ref Range   WBC 7.2 4.0 - 10.5 K/uL   RBC 4.49 3.87 - 5.11 MIL/uL   Hemoglobin 13.0 12.0 - 15.0 g/dL   HCT 82.9 56.2 - 13.0 %   MCV 95.3 80.0 - 100.0 fL   MCH 29.0 26.0 - 34.0 pg   MCHC 30.4 30.0 - 36.0 g/dL   RDW 86.5 78.4 - 69.6 %   Platelets 213 150 - 400 K/uL   nRBC 0.0 0.0 - 0.2 %    Comment: Performed at [facility], [address] [place], [place] [address]  Pulmonary Function Test [facility] Only     Status: None   Collection Time: 08/11/22  2:48 PM  Result Value Ref Range   FVC-Pre 1.50 L   FVC-%Pred-Pre 53 %   FVC-Post 1.41 L   FVC-%Pred-Post 50 %   FVC-%Change-Post -6 %   FEV1-Pre 1.34 L   FEV1-%Pred-Pre 63 %   FEV1-Post 1.24 L   FEV1-%Pred-Post 58 %   FEV1-%Change-Post -7 %   FEV6-Pre 1.50 L   FEV6-%Pred-Pre 56 %   FEV6-Post 1.41 L   FEV6-%Pred-Post 52 %   FEV6-%Change-Post -6 %   Pre FEV1/FVC ratio 89 %   FEV1FVC-%Pred-Pre 118 %   Post FEV1/FVC ratio 88 %   FEV1FVC-%Change-Post -1 %   Pre FEV6/FVC Ratio 100 %   FEV6FVC-%Pred-Pre 104 %   Post FEV6/FVC ratio 100 %   FEV6FVC-%Pred-Post 104 %   FEF 25-75 Pre 2.06 L/sec   FEF2575-%Pred-Pre 118 %   FEF 25-75 Post 1.45 L/sec   FEF2575-%Pred-Post 83 %   FEF2575-%Change-Post -29 %   RV 0.96 L   RV % pred 43 %   TLC 2.89 L   TLC % pred 58 %   DLCO unc 10.70 ml/min/mmHg   DLCO unc % pred 57 %   DL/VA 4.13 ml/min/mmHg/L   DL/VA % pred 83 %  POCT HgB A1C     Status: Abnormal   Collection Time: 08/13/22  2:59 PM  Result Value Ref Range   Hemoglobin A1C 7.8 (A) 4.0 - 5.6 %   HbA1c POC (<> result, manual entry) 7.8 4.0 - 5.6 %   HbA1c, POC (prediabetic range) 7.8 (A) 5.7 - 6.4 %   HbA1c, POC (controlled diabetic range) 7.8 (A) 0.0 - 7.0 %    Patient had pulmonary function testing performed on 11 Aug 2022 that shows that her obstructive defect is minimal i.e. the component from her persistent asthma.  At the time of her visit of this component was  well-controlled.  However, she has severe restriction due to obesity and known diaphragm paralysis and this cannot be optimized.  Will put her at higher risk for postoperative atelectasis and potential pneumonia, etc.  The risk assessment is as noted above on the note at the time of the visit on 30 Jul 2022, please see discussion points above as well.  Gailen Shelter, MD Advanced Bronchoscopy PCCM [facility]    *This note was dictated using voice  recognition software/Dragon.  Despite best efforts to proofread, errors can occur which can change the meaning. Any transcriptional errors that result from this process are unintentional and may not be fully corrected at the time of dictation.

## 2022-07-31 ENCOUNTER — Encounter: Payer: Self-pay | Admitting: Pulmonary Disease

## 2022-07-31 ENCOUNTER — Encounter: Payer: Self-pay | Admitting: Medical

## 2022-07-31 ENCOUNTER — Ambulatory Visit: Payer: Medicare HMO | Attending: Medical | Admitting: Medical

## 2022-07-31 VITALS — BP 109/69 | HR 69 | Ht 65.5 in | Wt 265.0 lb

## 2022-07-31 DIAGNOSIS — I10 Essential (primary) hypertension: Secondary | ICD-10-CM

## 2022-07-31 DIAGNOSIS — I4821 Permanent atrial fibrillation: Secondary | ICD-10-CM

## 2022-07-31 DIAGNOSIS — N183 Chronic kidney disease, stage 3 unspecified: Secondary | ICD-10-CM

## 2022-07-31 DIAGNOSIS — I5032 Chronic diastolic (congestive) heart failure: Secondary | ICD-10-CM

## 2022-07-31 DIAGNOSIS — Z01810 Encounter for preprocedural cardiovascular examination: Secondary | ICD-10-CM | POA: Diagnosis not present

## 2022-07-31 NOTE — Progress Notes (Unsigned)
Cardiology Office Note:    Date:  07/31/2022   ID:  [name], DOB Sep 10, 1950, MRN [id number]  PCP:  [name], NP  [facility] Cardiologist:  Lorine Bears, MD  [facility] Electrophysiologist:  None   Referring MD: [name], NP   Chief Complaint: 6 month follow-up  History of Present Illness:    [name] is a 72 y.o. adult with a hx of atrial fibrillation, chronic diastolic heart failure, diabetes type 2, hypertension, hyperlipidemia, obesity, CKD stage III, sleep apnea on CPAP, previous DVT who presents for follow-up.  In the setting of atypical chest pain, she underwent stress testing in January 2020, which was nonischemic.  In June 2021, she was admitted with pneumonia, sepsis, mild troponin elevation.  Echo at that time showed an EF of 60 to 65% without wall motion abnormalities.  It was felt troponin elevation was secondary to demand ischemia.  In September 2021, she was admitted with altered mental status and left upper extremity weakness.  MRI showed bilateral, small, multiple acute strokes suggestive of embolic etiology.  She is on Pradaxa at that time and is transition to Eliquis.  Unfortunately she suffered another stroke in November 2021, neurology suspected recurrent stroke due to severe malnutrition given history of gastric sleeve and noncompliance with vitamins.  In January 2022, she was admitted again for acute mental status and concern for stroke, though this was ruled out.  Echo with bubble study showed normal LV function without evidence of ASD or PFO.  In the setting of progressive weight gain and edema, echo was performed.  Echo November 2022 showed an EF of 55 to 60%, mild MR, moderate TR.  In March 2023, she required admission secondary to lower extremity cellulitis and acute on chronic HFpEF.  She was treated with IV antibiotics and diuresed 13 pounds.  The patient was seen in November 2023 with weight gain due to occasional noncompliance with  Lasix and dietary indiscretion.  Lasix was increased to 40 mg daily for the last week.  Patient was last seen in December 2023 was doing well with no significant volume overload.  Today, the patient reports she is overall doing OK. She is getting shoulder surgery in June. She reports severe left shoulder pain. She denies chest pain, shortness of breath, lower leg edema, orthopnea or pnd. She is using a 4 prong walker.   Past Medical History:  Diagnosis Date   Abnormal antibody titer    Anxiety and depression    Arthritis    Asthma    Atypical chest pain    a. 03/2018 MV: No ischemia/infarct. EF >65%.   Carotid arterial disease (HCC)    a. 03/2020 U/S: RICA <50, LICA nl.   Chronic heart failure with preserved ejection fraction (HFpEF) (HCC)    a. 08/2013 Echo: EF 60-65%; b. 08/2019 Echo: EF 60-65%; c. 03/2020 Echo: EF 60-65%; d. 05/2021 Echo: EF 55-60%, no rmwa, nl RV fxn, RVSP 54.48mmHg. Mildly dil LA, mild MR, mod TR.   CKD (chronic kidney disease), stage III (HCC)    Cystocele    Depression    Diabetes (HCC)    DM (diabetes mellitus) type II, controlled, with peripheral vascular disorder (HCC) 08/31/2014   Formatting of this note might be different from the original.  Last Assessment & Plan:   Stable. Continue metformin.   DVT (deep venous thrombosis) (HCC)    Righ calf   Dyspnea    11/08/2018 - "more now due to lack of  exercise"   Edema    GERD (gastroesophageal reflux disease)    Heart murmur    History of IBS    History of kidney stones    History of methicillin resistant staphylococcus aureus (MRSA) 2008   History of multiple strokes    Hyperlipidemia    Hypertension    Hypothyroidism    Insomnia    Kidney stone    kidney stones with lithotripsy .  [facility]- "adrenal glands"   Mitral regurgitation    a. 05/2021 Echo: Mild MR.   Neuropathy involving both lower extremities    Non-healing ulcer of left foot (HCC)    Obesity, Class II, BMI 35-39.9, with comorbidity     PAF (paroxysmal atrial fibrillation) (HCC)    a. 07/2013 Event monitor: Freq PVCs and 3 short runs of Afib-->CHA2DS2VASc = 7-->Eliquis.   PAH (pulmonary artery hypertension) (HCC)    a. 05/2021 Echo: RVSP 54.38mmHg.   Peripheral vascular disease (HCC)    a. 03/2018 LE Angio: Nl renal arteries. Nl aorta & iliacs. Nl LLE arterial flow; b. 06/2020 ABI's: Nl bilat.   Sleep apnea    use C-PAP   Stroke (HCC)    03/2020   Tricuspid regurgitation    a. 05/2021 Echo: Mod TR (in setting of RVSP 54.76mmHg).   Urinary incontinence    Venous stasis dermatitis of both lower extremities     Past Surgical History:  Procedure Laterality Date   ANKLE SURGERY Right    APPLICATION OF WOUND VAC Left 06/27/2015   Procedure: APPLICATION OF WOUND VAC ( POSSIBLE ) ;  Surgeon: Annice Needy, MD;  Location: [facility] ORS;  Service: Vascular;  Laterality: Left;   APPLICATION OF WOUND VAC Left 09/25/2016   Procedure: APPLICATION OF WOUND VAC;  Surgeon: Recardo Evangelist, DPM;  Location: [facility] ORS;  Service: Podiatry;  Laterality: Left;   arm surgery     right     CHOLECYSTECTOMY     COLONOSCOPY     COLONOSCOPY WITH PROPOFOL N/A 01/11/2017   Procedure: COLONOSCOPY WITH PROPOFOL;  Surgeon: Christena Deem, MD;  Location: [facility] ENDOSCOPY;  Service: Endoscopy;  Laterality: N/A;   COLONOSCOPY WITH PROPOFOL N/A 02/22/2017   Procedure: COLONOSCOPY WITH PROPOFOL;  Surgeon: Christena Deem, MD;  Location: [facility] ENDOSCOPY;  Service: Endoscopy;  Laterality: N/A;   COLONOSCOPY WITH PROPOFOL N/A 05/31/2017   Procedure: COLONOSCOPY WITH PROPOFOL;  Surgeon: Christena Deem, MD;  Location: [facility] ENDOSCOPY;  Service: Endoscopy;  Laterality: N/A;   COLONOSCOPY WITH PROPOFOL N/A 01/26/2022   Procedure: COLONOSCOPY WITH PROPOFOL;  Surgeon: Regis Bill, MD;  Location: [facility] ENDOSCOPY;  Service: Endoscopy;  Laterality: N/A;   ESOPHAGOGASTRODUODENOSCOPY (EGD) WITH PROPOFOL N/A 01/11/2017   Procedure: ESOPHAGOGASTRODUODENOSCOPY (EGD) WITH  PROPOFOL;  Surgeon: Christena Deem, MD;  Location: [facility] ENDOSCOPY;  Service: Endoscopy;  Laterality: N/A;   ESOPHAGOGASTRODUODENOSCOPY (EGD) WITH PROPOFOL N/A 10/25/2018   Procedure: ESOPHAGOGASTRODUODENOSCOPY (EGD) WITH PROPOFOL;  Surgeon: Christena Deem, MD;  Location: [facility] ENDOSCOPY;  Service: Endoscopy;  Laterality: N/A;   ESOPHAGOGASTRODUODENOSCOPY (EGD) WITH PROPOFOL N/A 11/29/2019   Procedure: ESOPHAGOGASTRODUODENOSCOPY (EGD) WITH PROPOFOL;  Surgeon: Toledo, Boykin Nearing, MD;  Location: [facility] ENDOSCOPY;  Service: Gastroenterology;  Laterality: N/A;   ESOPHAGOGASTRODUODENOSCOPY (EGD) WITH PROPOFOL N/A 01/26/2022   Procedure: ESOPHAGOGASTRODUODENOSCOPY (EGD) WITH PROPOFOL;  Surgeon: Regis Bill, MD;  Location: [facility] ENDOSCOPY;  Service: Endoscopy;  Laterality: N/A;  DM   HERNIA REPAIR     umbilical   HIATAL HERNIA REPAIR  I & D EXTREMITY Left 06/27/2015   Procedure: IRRIGATION AND DEBRIDEMENT EXTREMITY            ( CALF HEMATOMA ) POSSIBLE WOUND VAC;  Surgeon: Annice Needy, MD;  Location: [facility] ORS;  Service: Vascular;  Laterality: Left;   INCISION AND DRAINAGE OF WOUND Left 09/25/2016   Procedure: IRRIGATION AND DEBRIDEMENT - PARTIAL RESECTION OF ACHILLES TENDON WITH WOUND VAC APPLICATION;  Surgeon: Recardo Evangelist, DPM;  Location: [facility] ORS;  Service: Podiatry;  Laterality: Left;   INCISION AND DRAINAGE OF WOUND Left 11/09/2018   Procedure: Excision of left foot wound with ACell placement;  Surgeon: Peggye Form, DO;  Location: [facility] OR;  Service: Plastics;  Laterality: Left;   IRRIGATION AND DEBRIDEMENT ABSCESS Left 09/07/2016   Procedure: IRRIGATION AND DEBRIDEMENT ABSCESS LEFT HEEL;  Surgeon: Recardo Evangelist, DPM;  Location: [facility] ORS;  Service: Podiatry;  Laterality: Left;   JOINT REPLACEMENT  2013   left knee replacement   KIDNEY SURGERY Right    kidney stones   LITHOTRIPSY     LOWER EXTREMITY ANGIOGRAPHY Left 04/04/2018   Procedure: LOWER EXTREMITY ANGIOGRAPHY;  Surgeon:  Annice Needy, MD;  Location: [facility] INVASIVE CV LAB;  Service: Cardiovascular;  Laterality: Left;   NM GATED MYOVIEW [facility] HX)  February 2017   Likely breast attenuation. LOW RISK study. Normal EF 55-60%.   OTHER SURGICAL HISTORY     08/2016 or 09/2016 surgery on achilles tenso h/o staph infection heal. Dr. Orland Jarred   removal of left hematoma Left    leg   REVERSE SHOULDER ARTHROPLASTY Right 10/08/2015   Procedure: REVERSE SHOULDER ARTHROPLASTY;  Surgeon: Christena Flake, MD;  Location: [facility] ORS;  Service: Orthopedics;  Laterality: Right;   SLEEVE GASTROPLASTY     TONSILLECTOMY     TOTAL KNEE ARTHROPLASTY Left    TOTAL SHOULDER REPLACEMENT Right    TRANSESOPHAGEAL ECHOCARDIOGRAM  08/08/2013   Mild LVH, EF 60-65%. Moderate LA dilation and mild RA dilation. Mild MR with no evidence of stenosis and no evidence of endocarditis. A false chordae is noted.   TRANSTHORACIC ECHOCARDIOGRAM  08/03/2013   Mild-Moderate concentric LVH, EF 60-65%. Normal diastolic function. Mild LA dilation. Mild MR with possible vegetation  - not confirmed on TEE    ULNAR NERVE TRANSPOSITION Right 08/06/2016   Procedure: ULNAR NERVE DECOMPRESSION/TRANSPOSITION;  Surgeon: Christena Flake, MD;  Location: [facility] ORS;  Service: Orthopedics;  Laterality: Right;   UPPER GI ENDOSCOPY     VAGINAL HYSTERECTOMY      Current Medications: Current Meds  Medication Sig   acetaminophen (TYLENOL) 325 MG tablet Take 2 tablets (650 mg total) by mouth every 6 (six) hours as needed for mild pain or moderate pain (or Fever >/= 101).   albuterol (PROVENTIL) (2.5 MG/3ML) 0.083% nebulizer solution Take 3 mLs (2.5 mg total) by nebulization every 4 (four) hours as needed for wheezing or shortness of breath.   albuterol (VENTOLIN HFA) 108 (90 Base) MCG/ACT inhaler Inhale 2 puffs into the lungs every 6 (six) hours as needed.   ALPRAZolam (XANAX) 0.25 MG tablet Take 1 tablet (0.25 mg total) by mouth 2 (two) times daily as needed for anxiety. See SIG change  was taking 1/2 of 0.5 tablet bid prn changed to 0.25 bid prn. D/c 0.5 xanax   buPROPion (WELLBUTRIN XL) 150 MG 24 hr tablet Take 1 tablet (150 mg total) by mouth daily.   calcium-vitamin D (OSCAL WITH D) 500-200 MG-UNIT tablet Take 1 tablet by mouth 2 (  two) times daily with a meal.   cephALEXin (KEFLEX) 250 MG capsule Take 1 capsule (250 mg total) by mouth daily.   cholecalciferol (VITAMIN D) 25 MCG tablet Take 1 tablet (1,000 Units total) by mouth 2 (two) times daily with a meal.   clobetasol cream (TEMOVATE) 0.05 % Apply 1 application topically 2 (two) times daily as needed (skin irritation).    Continuous Glucose Sensor (FREESTYLE LIBRE 3 SENSOR) MISC    diclofenac Sodium (VOLTAREN) 1 % GEL Apply 2 g topically 3 (three) times daily.   DULoxetine (CYMBALTA) 60 MG capsule Take 1 capsule (60 mg total) by mouth daily.   ELIQUIS 5 MG TABS tablet TAKE 1 TABLET BY MOUTH TWICE DAILY   empagliflozin (JARDIANCE) 25 MG TABS tablet Take 1 tablet (25 mg total) by mouth daily before breakfast.   ergocalciferol (VITAMIN D2) 1.25 MG (50000 UT) capsule Take 50,000 Units by mouth once a week.   estradiol (ESTRACE) 0.1 MG/GM vaginal cream Apply 0.5mg  (pea-sized amount)  just inside the vaginal introitus with a finger-tip on Monday, Wednesday and Friday nights,   fluconazole (DIFLUCAN) 150 MG tablet Take 1 tablet by mouth x 1 dose. May repeat in 72 hours if needed.   Fluticasone-Umeclidin-Vilant (TRELEGY ELLIPTA) 200-62.5-25 MCG/ACT AEPB Inhale 1 puff into the lungs daily.   folic acid (FOLVITE) 1 MG tablet Take 1 tablet (1 mg total) by mouth daily.   furosemide (LASIX) 40 MG tablet Take 1 tablet (40 mg total) by mouth as directed. Take 1 tablet daily. May take additional 1 tablet If weight gain of greater than 2 pounds in 24 hours or 5 pounds in one week.   glucose blood (ONETOUCH ULTRA) test strip TEST TWICE DAILY AS DIRECTED   HYDROcodone-acetaminophen (NORCO/VICODIN) 5-325 MG tablet Take 1 tablet by mouth 2  (two) times daily as needed for severe pain. Must last 30 days   [START ON 08/01/2022] HYDROcodone-acetaminophen (NORCO/VICODIN) 5-325 MG tablet Take 1 tablet by mouth 2 (two) times daily as needed for severe pain. Must last 30 days   Lancets (ONETOUCH ULTRASOFT) lancets Use as instructed   levocetirizine (XYZAL) 5 MG tablet TAKE ONE TABLET BY MOUTH AT BEDTIME AS NEEDED FOR ALLERGIES   levothyroxine (SYNTHROID) 137 MCG tablet Take 1 tablet (137 mcg total) by mouth daily before breakfast. 30 minutes do not mix with vitamins or acid reflux medications D/c 150 mcg   losartan (COZAAR) 50 MG tablet TAKE 1 TABLET BY MOUTH DAILY   metoprolol succinate (TOPROL-XL) 25 MG 24 hr tablet Take 0.5 tablets (12.5 mg total) by mouth daily.   Misc. Devices (TUB TRANSFER BOARD) MISC 1 Device by Does not apply route as needed.   montelukast (SINGULAIR) 10 MG tablet Take 1 tablet (10 mg total) by mouth at bedtime.   Multiple Vitamin (MULTIVITAMIN WITH MINERALS) TABS tablet Take 1 tablet by mouth daily.   mupirocin ointment (BACTROBAN) 2 % APPLY TOPICALLY TWICE DAILY TO RIGHT LOWER LEG AS DIRECTED.   naloxone (NARCAN) nasal spray 4 mg/0.1 mL Place 1 spray into the nose as needed for up to 365 doses (for opioid-induced respiratory depresssion). In case of emergency (overdose), spray once into each nostril. If no response within 3 minutes, repeat application and call 911.   nystatin cream (MYCOSTATIN) Apply 1 application. topically 2 (two) times daily. Angles of mouth or skin folds prn   ondansetron (ZOFRAN) 4 MG tablet Take 1 tablet (4 mg total) by mouth every 8 (eight) hours as needed for nausea or vomiting.  pantoprazole (PROTONIX) 40 MG tablet Take 1 tablet (40 mg total) by mouth daily. 30 min before food   Powders (GOLD BOND NO MESS BODY POWDER) AERP Apply 1 application. topically daily as needed. Gold bond talc free powder   pregabalin (LYRICA) 50 MG capsule TAKE 1 CAPSULE BY MOUTH 3 TIMES DAILY   rosuvastatin  (CRESTOR) 20 MG tablet TAKE 1 TABLET BY MOUTH DAILY   sucralfate (CARAFATE) 1 GM/10ML suspension Take 1 g by mouth 3 (three) times daily.    thiamine 100 MG tablet Take 0.5 tablets (50 mg total) by mouth 2 (two) times daily with a meal.   tirzepatide (MOUNJARO) 5 MG/0.5ML Pen Inject 5 mg into the skin once a week.   Vibegron (GEMTESA) 75 MG TABS Take 1 tablet (75 mg total) by mouth daily.     Allergies:   Morphine and related, Penicillins, Ambien [zolpidem], Aspirin, and Oxytetracycline   Social History   Socioeconomic History   Marital status: Married    Spouse name: Not on file   Number of children: Not on file   Years of education: Not on file   Highest education level: Not on file  Occupational History   Not on file  Tobacco Use   Smoking status: Never   Smokeless tobacco: Never  Vaping Use   Vaping Use: Never used  Substance and Sexual Activity   Alcohol use: No    Alcohol/week: 0.0 standard drinks of alcohol   Drug use: Never   Sexual activity: Not Currently  Other Topics Concern   Not on file  Social History Narrative   She is currently married -- for 30+ years. Does not work. Does not smoke or take alcohol. She never smoked. She exercises at least 3 days a week since before her gastric surgery.   Marital status reviewed in history of present illness.   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    Social Determinants of Corporate investment banker Strain: Low Risk  (05/08/2020)   Overall Financial Resource Strain (CARDIA)    Difficulty of Paying Living Expenses: Not very hard  Food Insecurity: No Food Insecurity (05/08/2020)   Hunger Vital Sign    Worried About Running Out of Food in the Last Year: Never true    Ran Out of Food in the Last Year: Never true  Transportation Needs: Unmet Transportation Needs (05/07/2020)   PRAPARE - Administrator, Civil Service (Medical): Yes    Lack of Transportation  (Non-Medical): Yes  Physical Activity: Not on file  Stress: Not on file  Social Connections: Not on file     Family History: The patient's family history includes Alcohol abuse in her father; Arthritis in her maternal grandfather, maternal grandmother, mother, paternal grandfather, and paternal grandmother; Breast cancer in her maternal aunt; Cancer in her father and maternal aunt; Cerebral aneurysm in her father; Diabetes in her father, maternal grandmother, and paternal grandmother; Heart disease in her maternal grandfather; Hypertension in her father, maternal grandfather, maternal grandmother, paternal grandfather, and paternal grandmother; Kidney disease in her mother; Mental illness in her sister; Peripheral vascular disease in her father; Skin cancer in her father; Varicose Veins in her mother. There is no history of Bladder Cancer or Kidney cancer.  ROS:   Please see the history of present illness.     All other systems reviewed and are negative.  EKGs/Labs/Other Studies Reviewed:  The following studies were reviewed today:  Echo 05/2021 1. Left ventricular ejection fraction, by estimation, is 55 to 60%. The  left ventricle has normal function. The left ventricle has no regional  wall motion abnormalities. Left ventricular diastolic parameters are  indeterminate.   2. Right ventricular systolic function is normal. The right ventricular  size is normal. There is moderately elevated pulmonary artery systolic  pressure. The estimated right ventricular systolic pressure is 54.4 mmHg.   3. Left atrial size was mildly dilated.   4. The mitral valve is normal in structure. Mild mitral valve  regurgitation. No evidence of mitral stenosis.   5. Tricuspid valve regurgitation is moderate.   6. The aortic valve is normal in structure. Aortic valve regurgitation is  not visualized. No aortic stenosis is present.   7. The inferior vena cava is dilated in size with >50% respiratory   variability, suggesting right atrial pressure of 8 mmHg.    EKG:  EKG is ordered today.  The ekg ordered today demonstrates Afib, 69bpm, nonspecific T wave changes  Recent Labs: 11/27/2021: ALT 15; TSH 0.99 02/06/2022: BUN 29; Creatinine, Ser 1.25; Potassium 4.2; Sodium 140 07/17/2022: Hemoglobin 13.0; Platelets 213  Recent Lipid Panel    Component Value Date/Time   CHOL 88 05/27/2021 1509   CHOL 185 10/10/2014 0822   TRIG 70.0 05/27/2021 1509   HDL 55.50 05/27/2021 1509   HDL 94 10/10/2014 0822   CHOLHDL 2 05/27/2021 1509   VLDL 14.0 05/27/2021 1509   LDLCALC 18 05/27/2021 1509   LDLCALC 73 10/10/2014 0822     Physical Exam:    VS:  BP 109/69 (BP Location: Right Arm, Patient Position: Sitting, Cuff Size: Large)   Pulse 69   Ht 5' 5.5" (1.664 m)   Wt 265 lb (120.2 kg)   SpO2 95%   BMI 43.43 kg/m     Wt Readings from Last 3 Encounters:  07/31/22 265 lb (120.2 kg)  07/30/22 270 lb (122.5 kg)  07/17/22 272 lb 4.8 oz (123.5 kg)     GEN:  Well nourished, well developed in no acute distress HEENT: Normal NECK: No JVD; No carotid bruits LYMPHATICS: No lymphadenopathy CARDIAC: Irreg Irreg, no murmurs, rubs, gallops RESPIRATORY:  Clear to auscultation without rales, wheezing or rhonchi  ABDOMEN: Soft, non-tender, non-distended MUSCULOSKELETAL:  No edema; No deformity  SKIN: Warm and dry NEUROLOGIC:  Alert and oriented x 3 PSYCHIATRIC:  Normal affect   ASSESSMENT:    1. Permanent atrial fibrillation (HCC)   2. Chronic diastolic heart failure (HCC)   3. Essential hypertension   4. Pre-operative cardiovascular examination   5. Stage 3 chronic kidney disease, unspecified whether stage 3a or 3b CKD (HCC)    PLAN:    In order of problems listed above:  Permanent Afib Patient is in rate controlled Afib. Continue Eliquis for stroke ppx. Continue Toprol 12.5mg  daily for rate control.   Chronic diastolic heart failure The patient is euvolemic on exam. She is on lasix  40mg  daily.   HTN BP is good today, continue current medications.   Pre-op cardiac evaluation Left shoulder surgery Patient is planning on left shoulder surgery in June. Shoulder surgery addressed at the last visit. Patient will need bridging with Lovenox if Eliquis is interrupted given high CHADSVASC (h/o stroke). I will check on lovenox prescription. METS 3.63. According to the RCRI patient is Class III risk with 10.1% risk of death, MI or cardiac arrest. No prior cardiac work-up required prior to surgery,  CKD stage 3 Labs in 2023 were stable, Scr around 1.25.   Disposition: Follow up in 6 month(s) with Md/APP   Signed, Merl Guardino David Stall, PA-C  07/31/2022 4:13 PM    [facility]

## 2022-07-31 NOTE — Telephone Encounter (Signed)
Pt would like a call regarding guidance for lovenox bridge.

## 2022-07-31 NOTE — Patient Instructions (Signed)
Medication Instructions:  Your Physician recommend you continue on your current medication as directed.    *If you need a refill on your cardiac medications before your next appointment, please call your pharmacy*   Lab Work: None ordered today   Testing/Procedures: None ordered today   Follow-Up: At [facility], you and your health needs are our priority.  As part of our continuing mission to provide you with exceptional heart care, we have created designated Provider Care Teams.  These Care Teams include your primary Cardiologist (physician) and Advanced Practice Providers (APPs -  Physician Assistants and Nurse Practitioners) who all work together to provide you with the care you need, when you need it.  We recommend signing up for the patient portal called "MyChart".  Sign up information is provided on this After Visit Summary.  MyChart is used to connect with patients for Virtual Visits (Telemedicine).  Patients are able to view lab/test results, encounter notes, upcoming appointments, etc.  Non-urgent messages can be sent to your provider as well.   To learn more about what you can do with MyChart, go to [email or web address].    Your next appointment:   6 month(s)  Provider:   You may see Muhammad Arida, MD or one of the following Advanced Practice Providers on your designated Care Team:   Christopher Berge, NP Ryan Dunn, PA-C Cadence Furth, PA-C Sheri Hammock, NP      

## 2022-08-03 ENCOUNTER — Other Ambulatory Visit: Payer: Self-pay | Admitting: Pain Medicine

## 2022-08-05 NOTE — Telephone Encounter (Signed)
Patient went to  pharmacy to pick up scripts and was told she needed to call our office per Dr Laban Emperor request. She did not have surgery, it was postpone. Please call pateint.

## 2022-08-06 ENCOUNTER — Ambulatory Visit
Admission: RE | Admit: 2022-08-06 | Discharge: 2022-08-06 | Disposition: A | Discharge status: Home / Self Care | Payer: Medicare HMO | Source: Ambulatory Visit | Attending: Orthopedic Surgery | Admitting: Orthopedic Surgery

## 2022-08-06 ENCOUNTER — Other Ambulatory Visit: Payer: Self-pay | Admitting: Orthopedic Surgery

## 2022-08-06 DIAGNOSIS — M19012 Primary osteoarthritis, left shoulder: Secondary | ICD-10-CM | POA: Diagnosis not present

## 2022-08-06 NOTE — Telephone Encounter (Signed)
Patient states that she would like to hold off for now with the IV iron or now.

## 2022-08-11 ENCOUNTER — Ambulatory Visit: Payer: Medicare HMO | Attending: Pulmonary Disease

## 2022-08-11 DIAGNOSIS — F32A Depression, unspecified: Secondary | ICD-10-CM | POA: Diagnosis not present

## 2022-08-11 DIAGNOSIS — R0602 Shortness of breath: Secondary | ICD-10-CM | POA: Insufficient documentation

## 2022-08-11 DIAGNOSIS — R059 Cough, unspecified: Secondary | ICD-10-CM | POA: Insufficient documentation

## 2022-08-11 DIAGNOSIS — J986 Disorders of diaphragm: Secondary | ICD-10-CM | POA: Insufficient documentation

## 2022-08-11 DIAGNOSIS — R0681 Apnea, not elsewhere classified: Secondary | ICD-10-CM | POA: Diagnosis not present

## 2022-08-11 DIAGNOSIS — R0609 Other forms of dyspnea: Secondary | ICD-10-CM | POA: Insufficient documentation

## 2022-08-11 DIAGNOSIS — J4540 Moderate persistent asthma, uncomplicated: Secondary | ICD-10-CM

## 2022-08-11 DIAGNOSIS — I10 Essential (primary) hypertension: Secondary | ICD-10-CM | POA: Diagnosis not present

## 2022-08-11 DIAGNOSIS — G4733 Obstructive sleep apnea (adult) (pediatric): Secondary | ICD-10-CM | POA: Diagnosis not present

## 2022-08-11 DIAGNOSIS — Z8673 Personal history of transient ischemic attack (TIA), and cerebral infarction without residual deficits: Secondary | ICD-10-CM | POA: Diagnosis not present

## 2022-08-11 DIAGNOSIS — R942 Abnormal results of pulmonary function studies: Secondary | ICD-10-CM | POA: Diagnosis not present

## 2022-08-11 DIAGNOSIS — F419 Anxiety disorder, unspecified: Secondary | ICD-10-CM | POA: Diagnosis not present

## 2022-08-11 DIAGNOSIS — G4700 Insomnia, unspecified: Secondary | ICD-10-CM | POA: Diagnosis not present

## 2022-08-11 LAB — PULMONARY FUNCTION TEST ARMC ONLY
DL/VA % pred: 83 %
DL/VA: 3.47 ml/min/mmHg/L
DLCO unc % pred: 57 %
DLCO unc: 10.7 ml/min/mmHg
FEF 25-75 Post: 1.45 L/sec
FEF 25-75 Pre: 2.06 L/sec
FEF2575-%Change-Post: -29 %
FEF2575-%Pred-Post: 83 %
FEF2575-%Pred-Pre: 118 %
FEV1-%Change-Post: -7 %
FEV1-%Pred-Post: 58 %
FEV1-%Pred-Pre: 63 %
FEV1-Post: 1.24 L
FEV1-Pre: 1.34 L
FEV1FVC-%Change-Post: -1 %
FEV1FVC-%Pred-Pre: 118 %
FEV6-%Change-Post: -6 %
FEV6-%Pred-Post: 52 %
FEV6-%Pred-Pre: 56 %
FEV6-Post: 1.41 L
FEV6-Pre: 1.5 L
FEV6FVC-%Pred-Post: 104 %
FEV6FVC-%Pred-Pre: 104 %
FVC-%Change-Post: -6 %
FVC-%Pred-Post: 50 %
FVC-%Pred-Pre: 53 %
FVC-Post: 1.41 L
FVC-Pre: 1.5 L
Post FEV1/FVC ratio: 88 %
Post FEV6/FVC ratio: 100 %
Pre FEV1/FVC ratio: 89 %
Pre FEV6/FVC Ratio: 100 %
RV % pred: 43 %
RV: 0.96 L
TLC % pred: 58 %
TLC: 2.89 L

## 2022-08-11 MED ORDER — ALBUTEROL SULFATE (2.5 MG/3ML) 0.083% IN NEBU
2.5000 mg | INHALATION_SOLUTION | Freq: Once | RESPIRATORY_TRACT | Status: AC
  Administered 2022-08-11: 2.5 mg via RESPIRATORY_TRACT
  Filled 2022-08-11: qty 3

## 2022-08-13 ENCOUNTER — Telehealth: Payer: Self-pay

## 2022-08-13 ENCOUNTER — Ambulatory Visit (INDEPENDENT_AMBULATORY_CARE_PROVIDER_SITE_OTHER): Payer: Medicare HMO | Admitting: Nurse Practitioner

## 2022-08-13 ENCOUNTER — Encounter: Payer: Self-pay | Admitting: Nurse Practitioner

## 2022-08-13 VITALS — BP 120/60 | HR 70 | Temp 97.9°F | Ht 65.5 in | Wt 267.0 lb

## 2022-08-13 DIAGNOSIS — E1165 Type 2 diabetes mellitus with hyperglycemia: Secondary | ICD-10-CM

## 2022-08-13 DIAGNOSIS — M19012 Primary osteoarthritis, left shoulder: Secondary | ICD-10-CM | POA: Diagnosis not present

## 2022-08-13 DIAGNOSIS — B372 Candidiasis of skin and nail: Secondary | ICD-10-CM

## 2022-08-13 DIAGNOSIS — Z7985 Long-term (current) use of injectable non-insulin antidiabetic drugs: Secondary | ICD-10-CM | POA: Diagnosis not present

## 2022-08-13 DIAGNOSIS — B3731 Acute candidiasis of vulva and vagina: Secondary | ICD-10-CM | POA: Diagnosis not present

## 2022-08-13 DIAGNOSIS — Z7984 Long term (current) use of oral hypoglycemic drugs: Secondary | ICD-10-CM | POA: Diagnosis not present

## 2022-08-13 LAB — POCT GLYCOSYLATED HEMOGLOBIN (HGB A1C)
HbA1c POC (<> result, manual entry): 7.8 % (ref 4.0–5.6)
HbA1c, POC (controlled diabetic range): 7.8 % — AB (ref 0.0–7.0)
HbA1c, POC (prediabetic range): 7.8 % — AB (ref 5.7–6.4)
Hemoglobin A1C: 7.8 % — AB (ref 4.0–5.6)

## 2022-08-13 MED ORDER — TIRZEPATIDE 10 MG/0.5ML ~~LOC~~ SOAJ
10.0000 mg | SUBCUTANEOUS | 0 refills | Status: DC
Start: 2022-08-13 — End: 2022-10-07

## 2022-08-13 MED ORDER — NYSTATIN 100000 UNIT/GM EX POWD
1.0000 | Freq: Three times a day (TID) | CUTANEOUS | 3 refills | Status: DC
Start: 2022-08-13 — End: 2023-01-20

## 2022-08-13 MED ORDER — NYSTATIN 100000 UNIT/GM EX OINT
1.0000 | TOPICAL_OINTMENT | Freq: Two times a day (BID) | CUTANEOUS | 3 refills | Status: DC
Start: 2022-08-13 — End: 2023-01-20

## 2022-08-13 MED ORDER — FLUCONAZOLE 150 MG PO TABS
ORAL_TABLET | ORAL | 1 refills | Status: DC
Start: 2022-08-13 — End: 2022-09-02

## 2022-08-13 NOTE — Assessment & Plan Note (Signed)
A1c down to 7.8, able to proceed with left shoulder surgery on June 11th with Dr. Allena Katz, [facility]. Pending Cardiology and Pulmonology clearances.

## 2022-08-13 NOTE — Assessment & Plan Note (Signed)
Patient reports taking the Diflucan once since increasing her Jardiance. Denies any discharge. Symptoms currently consistent with cutaneous/topical yeast. Will refill Diflucan for patient to have on hand. Strict precautions given to patient. If not improving or worsening will plan to discontinue or decrease Jardiance. Will monitor.

## 2022-08-13 NOTE — Assessment & Plan Note (Signed)
Likely due to increase in Jardiance dosage last month. Will treat groin area with Nystatin powder and pubic area/labias with Nystatin ointment. Long discussion with patient on if not improving or worsening will have to decrease or discontinue Jardiance. Will continue to monitor closely. She will let me know if the area is not improving with the medications.

## 2022-08-13 NOTE — Progress Notes (Signed)
[name], NP-C Phone: [phone]  [name] is a 72 y.o. adult who presents today for diabetes follow up.   She has been working hard to get her A1c down to at least an 8.0 in order to have shoulder surgery with Carmon Ginsberg on June 11th.   DIABETES Disease Monitoring: Blood Sugar ranges- FreeStyle Libre reviewed- 7 day average: 141 Polyuria/phagia/dipsia- No      Optho- Yes Medications: Compliance- Mounjaro and Jardiance Hypoglycemic symptoms- She has had 2 episodes of blood sugar in the upper 60s, she was able to recognize that it was getting low and eat something.   Last A1c- 9.1 in March.  Lab Results  Component Value Date   HGBA1C 7.8 (A) 08/13/2022   HGBA1C 7.8 08/13/2022   HGBA1C 7.8 (A) 08/13/2022   HGBA1C 7.8 (A) 08/13/2022   She reports problems with yeast, mostly in her groin area and pubic area. She has used Nystatin cream in her groin previously to help with yeast. She reports her labias are excoriated and raw feeling. It is uncomfortable.   Social History   Tobacco Use  Smoking Status Never  Smokeless Tobacco Never    Current Outpatient Medications on File Prior to Visit  Medication Sig Dispense Refill   acetaminophen (TYLENOL) 325 MG tablet Take 2 tablets (650 mg total) by mouth every 6 (six) hours as needed for mild pain or moderate pain (or Fever >/= 101).     albuterol (PROVENTIL) (2.5 MG/3ML) 0.083% nebulizer solution Take 3 mLs (2.5 mg total) by nebulization every 4 (four) hours as needed for wheezing or shortness of breath. 75 mL 12   albuterol (VENTOLIN HFA) 108 (90 Base) MCG/ACT inhaler Inhale 2 puffs into the lungs every 6 (six) hours as needed. 8 g 2   ALPRAZolam (XANAX) 0.25 MG tablet Take 1 tablet (0.25 mg total) by mouth 2 (two) times daily as needed for anxiety. See SIG change was taking 1/2 of 0.5 tablet bid prn changed to 0.25 bid prn. D/c 0.5 xanax 60 tablet 2   buPROPion (WELLBUTRIN XL) 150 MG 24 hr tablet Take 1 tablet (150 mg  total) by mouth daily. 90 tablet 3   calcium-vitamin D (OSCAL WITH D) 500-200 MG-UNIT tablet Take 1 tablet by mouth 2 (two) times daily with a meal. 60 tablet 0   cephALEXin (KEFLEX) 250 MG capsule Take 1 capsule (250 mg total) by mouth daily. 90 capsule 1   cholecalciferol (VITAMIN D) 25 MCG tablet Take 1 tablet (1,000 Units total) by mouth 2 (two) times daily with a meal. 30 tablet 2   clobetasol cream (TEMOVATE) 0.05 % Apply 1 application topically 2 (two) times daily as needed (skin irritation).      Continuous Glucose Sensor (FREESTYLE LIBRE 3 SENSOR) MISC      diclofenac Sodium (VOLTAREN) 1 % GEL Apply 2 g topically 3 (three) times daily. 100 g 2   DULoxetine (CYMBALTA) 60 MG capsule Take 1 capsule (60 mg total) by mouth daily. 90 capsule 3   ELIQUIS 5 MG TABS tablet TAKE 1 TABLET BY MOUTH TWICE DAILY 180 tablet 1   empagliflozin (JARDIANCE) 25 MG TABS tablet Take 1 tablet (25 mg total) by mouth daily before breakfast. 90 tablet 3   enoxaparin (LOVENOX) 120 MG/0.8ML injection Inject 0.8 mLs (120 mg total) into the skin every 12 (twelve) hours. (Patient not taking: Reported on 07/31/2022) 4 mL 0   ergocalciferol (VITAMIN D2) 1.25 MG (50000 UT) capsule Take 50,000 Units by mouth once  a week.     estradiol (ESTRACE) 0.1 MG/GM vaginal cream Apply 0.5mg  (pea-sized amount)  just inside the vaginal introitus with a finger-tip on Monday, Wednesday and Friday nights, 42.5 g 11   Fluticasone-Umeclidin-Vilant (TRELEGY ELLIPTA) 200-62.5-25 MCG/ACT AEPB Inhale 1 puff into the lungs daily. 60 each 0   folic acid (FOLVITE) 1 MG tablet Take 1 tablet (1 mg total) by mouth daily. 90 tablet 3   furosemide (LASIX) 40 MG tablet Take 1 tablet (40 mg total) by mouth as directed. Take 1 tablet daily. May take additional 1 tablet If weight gain of greater than 2 pounds in 24 hours or 5 pounds in one week. 90 tablet 3   glucose blood (ONETOUCH ULTRA) test strip TEST TWICE DAILY AS DIRECTED 100 each 11    HYDROcodone-acetaminophen (NORCO/VICODIN) 5-325 MG tablet Take 1 tablet by mouth 2 (two) times daily as needed for severe pain. Must last 30 days 45 tablet 0   HYDROcodone-acetaminophen (NORCO/VICODIN) 5-325 MG tablet Take 1 tablet by mouth 2 (two) times daily as needed for severe pain. Must last 30 days 45 tablet 0   Lancets (ONETOUCH ULTRASOFT) lancets Use as instructed 100 each 12   levocetirizine (XYZAL) 5 MG tablet TAKE ONE TABLET BY MOUTH AT BEDTIME AS NEEDED FOR ALLERGIES 90 tablet 3   levothyroxine (SYNTHROID) 137 MCG tablet Take 1 tablet (137 mcg total) by mouth daily before breakfast. 30 minutes do not mix with vitamins or acid reflux medications D/c 150 mcg 90 tablet 3   losartan (COZAAR) 50 MG tablet TAKE 1 TABLET BY MOUTH DAILY 90 tablet 3   metoprolol succinate (TOPROL-XL) 25 MG 24 hr tablet Take 0.5 tablets (12.5 mg total) by mouth daily. 45 tablet 3   Misc. Devices (TUB TRANSFER BOARD) MISC 1 Device by Does not apply route as needed. 1 each 0   montelukast (SINGULAIR) 10 MG tablet Take 1 tablet (10 mg total) by mouth at bedtime. 90 tablet 3   Multiple Vitamin (MULTIVITAMIN WITH MINERALS) TABS tablet Take 1 tablet by mouth daily.     mupirocin ointment (BACTROBAN) 2 % APPLY TOPICALLY TWICE DAILY TO RIGHT LOWER LEG AS DIRECTED. 22 g 0   naloxone (NARCAN) nasal spray 4 mg/0.1 mL Place 1 spray into the nose as needed for up to 365 doses (for opioid-induced respiratory depresssion). In case of emergency (overdose), spray once into each nostril. If no response within 3 minutes, repeat application and call 911. 1 each 0   ondansetron (ZOFRAN) 4 MG tablet Take 1 tablet (4 mg total) by mouth every 8 (eight) hours as needed for nausea or vomiting. 90 tablet 1   pantoprazole (PROTONIX) 40 MG tablet Take 1 tablet (40 mg total) by mouth daily. 30 min before food 90 tablet 3   Powders (GOLD BOND NO MESS BODY POWDER) AERP Apply 1 application. topically daily as needed. Gold bond talc free powder 200 g  11   pregabalin (LYRICA) 50 MG capsule TAKE 1 CAPSULE BY MOUTH 3 TIMES DAILY 90 capsule 2   rosuvastatin (CRESTOR) 20 MG tablet TAKE 1 TABLET BY MOUTH DAILY 90 tablet 1   sucralfate (CARAFATE) 1 GM/10ML suspension Take 1 g by mouth 3 (three) times daily.      thiamine 100 MG tablet Take 0.5 tablets (50 mg total) by mouth 2 (two) times daily with a meal. 60 tablet 2   tirzepatide (MOUNJARO) 7.5 MG/0.5ML Pen Inject 7.5 mg into the skin once a week. (Patient not taking: Reported on 07/31/2022)  2 mL 0   Vibegron (GEMTESA) 75 MG TABS Take 1 tablet (75 mg total) by mouth daily. 30 tablet 11   No current facility-administered medications on file prior to visit.    ROS see history of present illness  Objective  Physical Exam Vitals:   08/13/22 1504  BP: 120/60  Pulse: 70  Temp: 97.9 F (36.6 C)  SpO2: 95%    BP Readings from Last 3 Encounters:  08/13/22 120/60  07/31/22 109/69  07/30/22 112/70   Wt Readings from Last 3 Encounters:  08/13/22 267 lb (121.1 kg)  07/31/22 265 lb (120.2 kg)  07/30/22 270 lb (122.5 kg)    Physical Exam Constitutional:      General: She is not in acute distress.    Appearance: Normal appearance.  HENT:     Head: Normocephalic.  Cardiovascular:     Rate and Rhythm: Normal rate and regular rhythm.     Heart sounds: Normal heart sounds.  Pulmonary:     Effort: Pulmonary effort is normal.     Breath sounds: Normal breath sounds.  Skin:    General: Skin is warm and dry.  Neurological:     General: No focal deficit present.     Mental Status: She is alert.  Psychiatric:        Mood and Affect: Mood normal.        Behavior: Behavior normal.    Assessment/Plan: Please see individual problem list.  Type 2 diabetes mellitus with hyperglycemia, without long-term current use of insulin (HCC) Assessment & Plan: A1c down to 7.8 today in office. She will continue her Mounjaro 7.5 mg weekly x 3 more weeks then increase to 10 mg weekly. She will continue  Jardiance 25 mg daily pending no worsening of candidiasis. Encouraged to continue healthy diet and exercise as tolerated. Her A1c is stable for her to proceed with her planned surgery. I will make her surgeon aware. She will follow up after surgery, in about one month, sooner PRN. Will continue to monitor.   Orders: -     POCT glycosylated hemoglobin (Hb A1C) -     Tirzepatide; Inject 10 mg into the skin once a week.  Dispense: 2 mL; Refill: 0  Primary osteoarthritis of left shoulder Assessment & Plan: A1c down to 7.8, able to proceed with left shoulder surgery on June 11th with Dr. Allena Katz, [facility]. Pending Cardiology and Pulmonology clearances.    Yeast vaginitis Assessment & Plan: Patient reports taking the Diflucan once since increasing her Jardiance. Denies any discharge. Symptoms currently consistent with cutaneous/topical yeast. Will refill Diflucan for patient to have on hand. Strict precautions given to patient. If not improving or worsening will plan to discontinue or decrease Jardiance. Will monitor.   Orders: -     Fluconazole; Take 1 tablet by mouth x 1 dose. May repeat in 72 hours if needed.  Dispense: 2 tablet; Refill: 1  Cutaneous candidiasis Assessment & Plan: Likely due to increase in Jardiance dosage last month. Will treat groin area with Nystatin powder and pubic area/labias with Nystatin ointment. Long discussion with patient on if not improving or worsening will have to decrease or discontinue Jardiance. Will continue to monitor closely. She will let me know if the area is not improving with the medications.   Orders: -     Nystatin; Apply 1 Application topically 2 (two) times daily.  Dispense: 60 g; Refill: 3 -     Nystatin; Apply 1 Application topically 3 (three)  times daily.  Dispense: 120 g; Refill: 3    Return in about 1 month (around 09/14/2022) for Follow up.   [name], NP-C  Primary Care - [employer]

## 2022-08-13 NOTE — Telephone Encounter (Signed)
I read her PFTs and my initial impressions on her remain the same.  I will add an addendum to her note later today.  Then that note can be sent over to Dr. Allena Katz.

## 2022-08-13 NOTE — Assessment & Plan Note (Signed)
A1c down to 7.8 today in office. She will continue her Mounjaro 7.5 mg weekly x 3 more weeks then increase to 10 mg weekly. She will continue Jardiance 25 mg daily pending no worsening of candidiasis. Encouraged to continue healthy diet and exercise as tolerated. Her A1c is stable for her to proceed with her planned surgery. I will make her surgeon aware. She will follow up after surgery, in about one month, sooner PRN. Will continue to monitor.

## 2022-08-13 NOTE — Telephone Encounter (Signed)
Received a surgical clearance for this patient. Left reverse total shoulder arthroplasty, biceps tenodesis scheduled for 09/01/2022 by Dr. Allena Katz at [facility].  Per Dr. Jayme Cloud last note, She will have repeat PFTs on 21 May and we will add further comments to preoperative assessment after that testing is done.   Dr. Jayme Cloud, PFT was on 5/21. Please advise.

## 2022-08-14 ENCOUNTER — Telehealth: Payer: Self-pay | Admitting: Pulmonary Disease

## 2022-08-14 NOTE — Telephone Encounter (Signed)
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  This is what makes her higher risk for surgery.  Her component of asthma is not very bad.

## 2022-08-14 NOTE — Telephone Encounter (Signed)
Please see 08/13/2022 phone note.

## 2022-08-14 NOTE — Telephone Encounter (Signed)
OK to send copy of PFTs results.

## 2022-08-14 NOTE — Telephone Encounter (Signed)
Last OV note has been faxed to [facility]. Received successful confirmation.   Patient is aware and voiced her understanding.  She is requesting PFT results.   Dr. Jayme Cloud, please advise.

## 2022-08-14 NOTE — Telephone Encounter (Signed)
Patient would like to know what PFT showed

## 2022-08-14 NOTE — Telephone Encounter (Signed)
Dr. Jayme Cloud, please let me know once note has been updated.

## 2022-08-14 NOTE — Telephone Encounter (Signed)
Note has been updated.

## 2022-08-14 NOTE — Telephone Encounter (Signed)
Patient is returning phone call. Patient phone number is [phone].

## 2022-08-14 NOTE — Telephone Encounter (Signed)
Patient is aware of results and voiced her understanding.  Nothing further needed.   

## 2022-08-19 ENCOUNTER — Telehealth: Payer: Self-pay | Admitting: Cardiovascular Disease

## 2022-08-19 ENCOUNTER — Other Ambulatory Visit: Payer: Self-pay

## 2022-08-19 ENCOUNTER — Encounter
Admission: RE | Admit: 2022-08-19 | Discharge: 2022-08-19 | Disposition: A | Discharge status: Home / Self Care | Payer: Medicare HMO | Source: Ambulatory Visit | Attending: Orthopedic Surgery | Admitting: Orthopedic Surgery

## 2022-08-19 VITALS — BP 121/78 | HR 82 | Resp 16 | Ht 65.0 in | Wt 267.0 lb

## 2022-08-19 DIAGNOSIS — Z01818 Encounter for other preprocedural examination: Secondary | ICD-10-CM | POA: Diagnosis not present

## 2022-08-19 DIAGNOSIS — M19012 Primary osteoarthritis, left shoulder: Secondary | ICD-10-CM | POA: Diagnosis not present

## 2022-08-19 DIAGNOSIS — E1151 Type 2 diabetes mellitus with diabetic peripheral angiopathy without gangrene: Secondary | ICD-10-CM | POA: Diagnosis not present

## 2022-08-19 DIAGNOSIS — E1165 Type 2 diabetes mellitus with hyperglycemia: Secondary | ICD-10-CM | POA: Insufficient documentation

## 2022-08-19 HISTORY — DX: Anemia, unspecified: D64.9

## 2022-08-19 HISTORY — DX: Fatty (change of) liver, not elsewhere classified: K76.0

## 2022-08-19 HISTORY — DX: Heart failure, unspecified: I50.9

## 2022-08-19 HISTORY — DX: Other complications of anesthesia, initial encounter: T88.59XA

## 2022-08-19 LAB — CBC WITH DIFFERENTIAL/PLATELET
Abs Immature Granulocytes: 0.02 10*3/uL (ref 0.00–0.07)
Basophils Absolute: 0.1 10*3/uL (ref 0.0–0.1)
Basophils Relative: 1 %
Eosinophils Absolute: 0.3 10*3/uL (ref 0.0–0.5)
Eosinophils Relative: 4 %
HCT: 46.9 % — ABNORMAL HIGH (ref 36.0–46.0)
Hemoglobin: 14.3 g/dL (ref 12.0–15.0)
Immature Granulocytes: 0 %
Lymphocytes Relative: 17 %
Lymphs Abs: 1.2 10*3/uL (ref 0.7–4.0)
MCH: 28.5 pg (ref 26.0–34.0)
MCHC: 30.5 g/dL (ref 30.0–36.0)
MCV: 93.6 fL (ref 80.0–100.0)
Monocytes Absolute: 0.6 10*3/uL (ref 0.1–1.0)
Monocytes Relative: 8 %
Neutro Abs: 5.1 10*3/uL (ref 1.7–7.7)
Neutrophils Relative %: 70 %
Platelets: 236 10*3/uL (ref 150–400)
RBC: 5.01 MIL/uL (ref 3.87–5.11)
RDW: 13.8 % (ref 11.5–15.5)
WBC: 7.3 10*3/uL (ref 4.0–10.5)
nRBC: 0 % (ref 0.0–0.2)

## 2022-08-19 LAB — URINALYSIS, ROUTINE W REFLEX MICROSCOPIC
Bilirubin Urine: NEGATIVE
Glucose, UA: >=500 mg/dL — AB
Hgb urine dipstick: NEGATIVE
Ketones, ur: NEGATIVE mg/dL
Leukocytes,Ua: NEGATIVE
Nitrite: NEGATIVE
Protein, ur: NEGATIVE mg/dL
Specific Gravity, Urine: 1.012 (ref 1.005–1.030)
pH: 5 (ref 5.0–8.0)

## 2022-08-19 LAB — COMPREHENSIVE METABOLIC PANEL
ALT: 21 U/L (ref 0–44)
AST: 30 U/L (ref 15–41)
Albumin: 4.4 g/dL (ref 3.5–5.0)
Alkaline Phosphatase: 76 U/L (ref 38–126)
Anion gap: 11 (ref 5–15)
BUN: 30 mg/dL — ABNORMAL HIGH (ref 8–23)
CO2: 30 mmol/L (ref 22–32)
Calcium: 9.7 mg/dL (ref 8.9–10.3)
Chloride: 99 mmol/L (ref 98–111)
Creatinine, Ser: 1.63 mg/dL — ABNORMAL HIGH (ref 0.44–1.00)
GFR, Estimated: 33 mL/min — ABNORMAL LOW (ref >60)
Glucose, Bld: 113 mg/dL — ABNORMAL HIGH (ref 70–99)
Potassium: 4.7 mmol/L (ref 3.5–5.1)
Sodium: 140 mmol/L (ref 135–145)
Total Bilirubin: 0.8 mg/dL (ref 0.3–1.2)
Total Protein: 7.4 g/dL (ref 6.5–8.1)

## 2022-08-19 LAB — TYPE AND SCREEN: Antibody Screen: POSITIVE

## 2022-08-19 LAB — SURGICAL PCR SCREEN
MRSA, PCR: NEGATIVE
Staphylococcus aureus: NEGATIVE

## 2022-08-19 NOTE — Telephone Encounter (Signed)
Called and spoke with pharmacy and clarified dose.

## 2022-08-19 NOTE — Patient Instructions (Addendum)
Your procedure is scheduled on: Tuesday 09/01/22 To find out your arrival time, please call [phone] between 1PM - 3PM on:   Monday 08/31/22 Report to the Registration Desk on the 1st floor of the [facility]. Free Valet parking is available.  If your arrival time is 6:00 am, do not arrive before that time as the [facility] entrance doors do not open until 6:00 am.  REMEMBER: Instructions that are not followed completely may result in serious medical risk, up to and including death; or upon the discretion of your surgeon and anesthesiologist your surgery may need to be rescheduled.  Do not eat food after midnight the night before surgery.  No gum chewing or hard candies.  You may however, drink CLEAR liquids up to 2 hours before you are scheduled to arrive for your surgery. Do not drink anything within 2 hours of your scheduled arrival time.  Type 1 and Type 2 diabetics should only drink water.  In addition, your doctor has ordered for you to drink the provided:   Gatorade G2 Drinking this carbohydrate drink up to two hours before surgery helps to reduce insulin resistance and improve patient outcomes. Please complete drinking 2 hours before scheduled arrival time.  One week prior to surgery: Stop Anti-inflammatories (NSAIDS) such as Advil, Aleve, Ibuprofen, Motrin, Naproxen, Naprosyn and Aspirin based products such as Excedrin, Goody's Powder, BC Powder. You may however, continue to take Tylenol if needed for pain up until the day of surgery.  Stop ANY OVER THE COUNTER supplements or vitamins until after surgery. 7 days  Continue taking all prescribed medications with the exception of the following: Mounjaro, last dose Thursday 08/20/22. Jardiance, last dose Friday 08/28/22. Eliquis, last dose Friday 08/28/22 and start the Lovenox on Saturday 08/29/22  TAKE ONLY THESE MEDICATIONS THE MORNING OF SURGERY WITH A SIP OF WATER:  buPROPion (WELLBUTRIN XL)  DULoxetine (CYMBALTA)   levothyroxine (SYNTHROID)  pantoprazole (PROTONIX) Antacid (take one the night before and one on the morning of surgery - helps to prevent nausea after surgery.) pregabalin (LYRICA)  Vibegron (GEMTESA)   Use inhalers on the day of surgery and bring to the hospital. Use your nebulizer the morning of surgery  No Alcohol for 24 hours before or after surgery.  No Smoking including e-cigarettes for 24 hours before surgery.  No chewable tobacco products for at least 6 hours before surgery.  No nicotine patches on the day of surgery.  Do not use any "recreational" drugs for at least a week (preferably 2 weeks) before your surgery.  Please be advised that the combination of cocaine and anesthesia may have negative outcomes, up to and including death. If you test positive for cocaine, your surgery will be cancelled.  On the morning of surgery brush your teeth with toothpaste and water, you may rinse your mouth with mouthwash if you wish. Do not swallow any toothpaste or mouthwash.  Use CHG Soap or wipes as directed on instruction sheet. Shower daily for 5 days with CHG soap starting on Friday 08/28/22  Do not wear lotions, powders, or perfumes.   Do not shave body hair from the neck down 48 hours before surgery.  Wear comfortable clothing (specific to your surgery type) to the hospital.  Do not wear jewelry, make-up, hairpins, clips or nail polish.  Contact lenses, hearing aids and dentures may not be worn into surgery.  Bring your C-PAP to the hospital in case you may have to spend the night.   Do not  bring valuables to the hospital. [facility] is not responsible for any missing/lost belongings or valuables.   Total Shoulder Arthroplasty:  use Benzoyl Peroxide 5% Gel as directed on instruction sheet.  Notify your doctor if there is any change in your medical condition (cold, fever, infection).  If you are being discharged the day of surgery, you will not be allowed to drive  home. You will need a responsible individual to drive you home and stay with you for 24 hours after surgery.   If you are taking public transportation, you will need to have a responsible individual with you.  If you are being admitted to the hospital overnight, leave your suitcase in the car. After surgery it may be brought to your room.  In case of increased patient census, it may be necessary for you, the patient, to continue your postoperative care in the Same Day Surgery department.  After surgery, you can help prevent lung complications by doing breathing exercises.  Take deep breaths and cough every 1-2 hours. Your doctor may order a device called an Incentive Spirometer to help you take deep breaths. When coughing or sneezing, hold a pillow firmly against your incision with both hands. This is called "splinting." Doing this helps protect your incision. It also decreases belly discomfort.  Surgery Visitation Policy:  Patients undergoing a surgery or procedure may have two family members or support persons with them as long as the person is not COVID-19 positive or experiencing its symptoms.   Inpatient Visitation:    Visiting hours are 7 a.m. to 8 p.m. Up to four visitors are allowed at one time in a patient room. The visitors may rotate out with other people during the day. One designated support person (adult) may remain overnight.  Please call the Pre-admissions Testing Dept. at [phone] if you have any questions about these instructions.    Pre-operative 5 CHG Bath Instructions   You can play a key role in reducing the risk of infection after surgery. Your skin needs to be as free of germs as possible. You can reduce the number of germs on your skin by washing with CHG (chlorhexidine gluconate) soap before surgery. CHG is an antiseptic soap that kills germs and continues to kill germs even after washing.   DO NOT use if you have an allergy to chlorhexidine/CHG or  antibacterial soaps. If your skin becomes reddened or irritated, stop using the CHG and notify one of our RNs at [phone].   Please shower with the CHG soap starting 4 days before surgery using the following schedule:     Please keep in mind the following:  DO NOT shave, including legs and underarms, starting the day of your first shower.   You may shave your face at any point before/day of surgery.  Place clean sheets on your bed the day you start using CHG soap. Use a clean washcloth (not used since being washed) for each shower. DO NOT sleep with pets once you start using the CHG.   CHG Shower Instructions:  If you choose to wash your hair and private area, wash first with your normal shampoo/soap.  After you use shampoo/soap, rinse your hair and body thoroughly to remove shampoo/soap residue.  Turn the water OFF and apply about 3 tablespoons (45 ml) of CHG soap to a CLEAN washcloth.  Apply CHG soap ONLY FROM YOUR NECK DOWN TO YOUR TOES (washing for 3-5 minutes)  DO NOT use CHG soap on face, private areas,  open wounds, or sores.  Pay special attention to the area where your surgery is being performed.  If you are having back surgery, having someone wash your back for you may be helpful. Wait 2 minutes after CHG soap is applied, then you may rinse off the CHG soap.  Pat dry with a clean towel  Put on clean clothes/pajamas   If you choose to wear lotion, please use ONLY the CHG-compatible lotions on the back of this paper.     Additional instructions for the day of surgery: DO NOT APPLY any lotions, deodorants, cologne, or perfumes.   Put on clean/comfortable clothes.  Brush your teeth.  Ask your nurse before applying any prescription medications to the skin.      CHG Compatible Lotions   Aveeno Moisturizing lotion  Cetaphil Moisturizing Cream  Cetaphil Moisturizing Lotion  Clairol Herbal Essence Moisturizing Lotion, Dry Skin  Clairol Herbal Essence Moisturizing Lotion,  Extra Dry Skin  Clairol Herbal Essence Moisturizing Lotion, Normal Skin  Curel Age Defying Therapeutic Moisturizing Lotion with Alpha Hydroxy  Curel Extreme Care Body Lotion  Curel Soothing Hands Moisturizing Hand Lotion  Curel Therapeutic Moisturizing Cream, Fragrance-Free  Curel Therapeutic Moisturizing Lotion, Fragrance-Free  Curel Therapeutic Moisturizing Lotion, Original Formula  Eucerin Daily Replenishing Lotion  Eucerin Dry Skin Therapy Plus Alpha Hydroxy Crme  Eucerin Dry Skin Therapy Plus Alpha Hydroxy Lotion  Eucerin Original Crme  Eucerin Original Lotion  Eucerin Plus Crme Eucerin Plus Lotion  Eucerin TriLipid Replenishing Lotion  Keri Anti-Bacterial Hand Lotion  Keri Deep Conditioning Original Lotion Dry Skin Formula Softly Scented  Keri Deep Conditioning Original Lotion, Fragrance Free Sensitive Skin Formula  Keri Lotion Fast Absorbing Fragrance Free Sensitive Skin Formula  Keri Lotion Fast Absorbing Softly Scented Dry Skin Formula  Keri Original Lotion  Keri Skin Renewal Lotion Keri Silky Smooth Lotion  Keri Silky Smooth Sensitive Skin Lotion  Nivea Body Creamy Conditioning Oil  Nivea Body Extra Enriched Lotion  Nivea Body Original Lotion  Nivea Body Sheer Moisturizing Lotion Nivea Crme  Nivea Skin Firming Lotion  NutraDerm 30 Skin Lotion  NutraDerm Skin Lotion  NutraDerm Therapeutic Skin Cream  NutraDerm Therapeutic Skin Lotion  ProShield Protective Hand Cream  Provon moisturizing lotion  How to Use an Incentive Spirometer  An incentive spirometer is a tool that measures how well you are filling your lungs with each breath. Learning to take long, deep breaths using this tool can help you keep your lungs clear and active. This may help to reverse or lessen your chance of developing breathing (pulmonary) problems, especially infection. You may be asked to use a spirometer: After a surgery. If you have a lung problem or a history of smoking. After a long period  of time when you have been unable to move or be active. If the spirometer includes an indicator to show the highest number that you have reached, your health care provider or respiratory therapist will help you set a goal. Keep a log of your progress as told by your health care provider. What are the risks? Breathing too quickly may cause dizziness or cause you to pass out. Take your time so you do not get dizzy or light-headed. If you are in pain, you may need to take pain medicine before doing incentive spirometry. It is harder to take a deep breath if you are having pain. How to use your incentive spirometer  Sit up on the edge of your bed or on a chair. Hold the incentive spirometer  so that it is in an upright position. Before you use the spirometer, breathe out normally. Place the mouthpiece in your mouth. Make sure your lips are closed tightly around it. Breathe in slowly and as deeply as you can through your mouth, causing the piston or the ball to rise toward the top of the chamber. Hold your breath for 3-5 seconds, or for as long as possible. If the spirometer includes a coach indicator, use this to guide you in breathing. Slow down your breathing if the indicator goes above the marked areas. Remove the mouthpiece from your mouth and breathe out normally. The piston or ball will return to the bottom of the chamber. Rest for a few seconds, then repeat the steps 10 or more times. Take your time and take a few normal breaths between deep breaths so that you do not get dizzy or light-headed. Do this every 1-2 hours when you are awake. If the spirometer includes a goal marker to show the highest number you have reached (best effort), use this as a goal to work toward during each repetition. After each set of 10 deep breaths, cough a few times. This will help to make sure that your lungs are clear. If you have an incision on your chest or abdomen from surgery, place a pillow or a rolled-up  towel firmly against the incision when you cough. This can help to reduce pain while taking deep breaths and coughing. General tips When you are able to get out of bed: Walk around often. Continue to take deep breaths and cough in order to clear your lungs. Keep using the incentive spirometer until your health care provider says it is okay to stop using it. If you have been in the hospital, you may be told to keep using the spirometer at home. Contact a health care provider if: You are having difficulty using the spirometer. You have trouble using the spirometer as often as instructed. Your pain medicine is not giving enough relief for you to use the spirometer as told. You have a fever. Get help right away if: You develop shortness of breath. You develop a cough with bloody mucus from the lungs. You have fluid or blood coming from an incision site after you cough. Summary An incentive spirometer is a tool that can help you learn to take long, deep breaths to keep your lungs clear and active. You may be asked to use a spirometer after a surgery, if you have a lung problem or a history of smoking, or if you have been inactive for a long period of time. Use your incentive spirometer as instructed every 1-2 hours while you are awake. If you have an incision on your chest or abdomen, place a pillow or a rolled-up towel firmly against your incision when you cough. This will help to reduce pain. Get help right away if you have shortness of breath, you cough up bloody mucus, or blood comes from your incision when you cough. This information is not intended to replace advice given to you by your health care provider. Make sure you discuss any questions you have with your health care provider. Document Revised: 05/29/2019 Document Reviewed: 05/29/2019 Elsevier Patient Education  2023 [employer]  Preparing for Total Shoulder Arthroplasty  Before surgery, you can play an important role by  reducing the number of germs on your skin by using the following products:  Benzoyl Peroxide Gel  o Reduces the number of germs present on the skin  o Applied twice a day to shoulder area starting two days before surgery  Chlorhexidine Gluconate (CHG) Soap  o An antiseptic cleaner that kills germs and bonds with the skin to continue killing germs even after washing  o Used for showering the night before surgery and morning of surgery  BENZOYL PEROXIDE 5% GEL  Please do not use if you have an allergy to benzoyl peroxide. If your skin becomes reddened/irritated stop using the benzoyl peroxide.  Starting two days before surgery, apply as follows:  1. Apply benzoyl peroxide in the morning and at night. Apply after taking a shower. If you are not taking a shower, clean entire shoulder front, back, and side along with the armpit with a clean wet washcloth.  2. Place a quarter-sized dollop on your shoulder and rub in thoroughly, making sure to cover the front, back, and side of your shoulder, along with the armpit.  2 days before ____ AM ____ PM Sunday 08/30/22 1 day before ____ AM ____ PM Monday 08/30/22  3. Do this twice a day for two days. (Last application is the night before surgery, AFTER using the CHG soap).  4. Do NOT apply benzoyl peroxide gel on the day of surgery.

## 2022-08-19 NOTE — Telephone Encounter (Signed)
Pt c/o medication issue:  1. Name of Medication:   enoxaparin (LOVENOX) 120 MG/0.8ML injection    2. How are you currently taking this medication (dosage and times per day)? As prescribed   3. Are you having a reaction (difficulty breathing--STAT)?   4. What is your medication issue? Pharmacy is calling to get clarification on medication quantity. Requesting call back.

## 2022-08-20 DIAGNOSIS — M25512 Pain in left shoulder: Secondary | ICD-10-CM | POA: Diagnosis not present

## 2022-08-20 DIAGNOSIS — G8929 Other chronic pain: Secondary | ICD-10-CM | POA: Diagnosis not present

## 2022-08-21 ENCOUNTER — Telehealth: Payer: Self-pay

## 2022-08-21 NOTE — Telephone Encounter (Signed)
Fax from [facility] for CLINICAL NOTICE  Came in for pt they would like to know if its okay to withhold mounjaro for a procedure.   Form has been placed in provider to be signed folder.

## 2022-08-22 ENCOUNTER — Other Ambulatory Visit: Payer: Self-pay | Admitting: Nurse Practitioner

## 2022-08-22 DIAGNOSIS — E1165 Type 2 diabetes mellitus with hyperglycemia: Secondary | ICD-10-CM

## 2022-08-25 ENCOUNTER — Telehealth: Payer: Self-pay

## 2022-08-25 NOTE — Telephone Encounter (Signed)
Incoming fax was received from [facility] important clinical notice   Form wanted to know if it is okay for pt to hold mounjaro 3 days prior to procedure.   PCP has signed form and form has been faxed back to the given fax number [phone] with a completed transmission log

## 2022-08-25 NOTE — Progress Notes (Signed)
  Perioperative Services Pre-Admission/Anesthesia Testing    Date: 08/25/22  Name: [name] MRN:   [id number]  Re: GLP-1 clearance and provider recommendations   Planned Surgical Procedure(s):    Case: [id number] Date/Time: 09/01/22 0715   Procedure: REVERSE SHOULDER ARTHROPLASTY (Left: Shoulder)   Anesthesia type: Choice   Pre-op diagnosis: Primary osteoarthritis of left shoulder M19.012   Location: [facility] OR ROOM 01 / [facility]   Surgeons: Signa Kell, MD      Clinical Notes:  Patient is scheduled for the above procedure with the indicated provider/surgeon. In review of her medication reconciliation it was noted that patient is on a prescribed GLP-1 medication. Per guidelines issued by the American Society of Anesthesiologists (ASA), it is recommended that these medications be held for 7 days prior to the patient undergoing any type of elective surgical procedure. The patient is taking the following GLP-1 medication:  []  SEMAGLUTIDE   []  EXENATIDE  []  LIRAGLUTIDE   []  LIXISENATIDE  []  DULAGLUTIDE     [x]  TIRZEPATIDE (GLP-1/GIP)  Reached out to prescribing provider Konrad Dolores, NP) to make them aware of the guidelines from anesthesia. Given that this patient takes the prescribed GLP-1 medication for her  diabetes diagnosis, rather than for weight loss, recommendations from the prescribing provider were solicited. Prescribing provider made aware of the following so that informed decision/POC can be developed for this patient that may be taking medications belonging to these drug classes:  Oral GLP-1 medications will be held 1 day prior to surgery.  Injectable GLP-1 medications will be held 7 days prior to surgery.  Metformin is routinely held 48 hours prior to surgery due to renal concerns, potential need for contrasted imaging perioperatively, and the potential for tissue hypoxia leading to drug induced lactic acidosis.  All SGLT2i medications are held 72  hours prior to surgery as they can be associated with the increased potential for developing euglycemic diabetic ketoacidosis (EDKA).   Impression and Plan:  [name] is on a prescribed GLP-1 medication, which induces the known side effect of decreased gastric emptying. Efforts are bring made to mitigate the risk of perioperative hyperglycemic events, as elevated blood glucose levels have been found to contribute to intra/postoperative complications. Additionally, hyperglycemic extremes can potentially necessitate the postponing of a patient's elective case in order to better optimize perioperative glycemic control, again with the aforementioned guidelines in place. With this in mind, recommendations have been sought from the prescribing provider, who has cleared patient to proceed with holding the prescribed GLP-1/GIP as per the guidelines from the ASA.   Provider recommending: no further recommendations received from the prescribing provider.  Copy of signed clearance and recommendations placed on patient's chart for inclusion in their medical record and for review by the surgical/anesthetic team on the day of her procedure.   Quentin Mulling, MSN, APRN, FNP-C, CEN [facility]  Peri-operative Services Nurse Practitioner Phone: [phone] 08/25/22 4:30 PM  NOTE: This note has been prepared using Dragon dictation software. Despite my best ability to proofread, there is always the potential that unintentional transcriptional errors may still occur from this process.

## 2022-08-26 ENCOUNTER — Encounter: Payer: Self-pay | Admitting: Pain Medicine

## 2022-08-26 ENCOUNTER — Ambulatory Visit: Payer: Medicare HMO | Attending: Pain Medicine | Admitting: Pain Medicine

## 2022-08-26 ENCOUNTER — Telehealth: Payer: Self-pay | Admitting: Pain Medicine

## 2022-08-26 VITALS — BP 153/90 | HR 96 | Temp 97.9°F | Ht 64.0 in | Wt 267.0 lb

## 2022-08-26 DIAGNOSIS — M25512 Pain in left shoulder: Secondary | ICD-10-CM | POA: Insufficient documentation

## 2022-08-26 DIAGNOSIS — M7918 Myalgia, other site: Secondary | ICD-10-CM | POA: Diagnosis not present

## 2022-08-26 DIAGNOSIS — G894 Chronic pain syndrome: Secondary | ICD-10-CM | POA: Diagnosis not present

## 2022-08-26 DIAGNOSIS — M159 Polyosteoarthritis, unspecified: Secondary | ICD-10-CM | POA: Diagnosis not present

## 2022-08-26 DIAGNOSIS — Z79891 Long term (current) use of opiate analgesic: Secondary | ICD-10-CM | POA: Diagnosis not present

## 2022-08-26 DIAGNOSIS — G8929 Other chronic pain: Secondary | ICD-10-CM

## 2022-08-26 DIAGNOSIS — M25511 Pain in right shoulder: Secondary | ICD-10-CM | POA: Insufficient documentation

## 2022-08-26 DIAGNOSIS — Z79899 Other long term (current) drug therapy: Secondary | ICD-10-CM | POA: Insufficient documentation

## 2022-08-26 MED ORDER — HYDROCODONE-ACETAMINOPHEN 5-325 MG PO TABS: 1.0000 | ORAL_TABLET | Freq: Two times a day (BID) | ORAL | 0 refills | Status: DC | PRN

## 2022-08-26 MED ORDER — NALOXONE HCL 4 MG/0.1ML NA LIQD
1.0000 | NASAL | 0 refills | Status: AC | PRN
Start: 2022-08-26 — End: 2023-08-26

## 2022-08-26 NOTE — Progress Notes (Signed)
PROVIDER NOTE: Information contained herein reflects review and annotations entered in association with encounter. Interpretation of such information and data should be left to medically-trained personnel. Information provided to patient can be located elsewhere in the medical record under "Patient Instructions". Document created using STT-dictation technology, any transcriptional errors that may result from process are unintentional.    Patient: [name]  Service Category: E/M  Provider: Oswaldo Done, MD  DOB: [birth year]  DOS: 08/26/2022  Referring Provider: [name], NP  MRN: [id number]  Specialty: Interventional Pain Management  PCP: [name], NP  Type: Established Patient  Setting: Ambulatory outpatient    Location: Office  Delivery: Face-to-face     HPI  [name], a 72 y.o. year old adult, is here today because of her Chronic pain syndrome [G89.4]. Ms. Manrriquez primary complain today is Shoulder Pain (both)  Pertinent problems: Ms. Wyble has Diabetic peripheral neuropathy associated with type 2 diabetes mellitus (HCC); Other shoulder lesions (Right); Neuropathy of lateral femoral cutaneous nerve (Right); Arthralgia; Osteoarthritis involving multiple joints; Status post reverse total shoulder replacement; Chronic pain syndrome; Cervical radiculitis; Cubital tunnel syndrome (Right); Impingement syndrome of shoulder region; Pain in joint involving ankle and foot; Spinal stenosis of thoracic region; Hip joint pain (Left); Abnormality of gait and mobility; Muscle strain of hip (Left); Hip strain (Right); Ischial pain, right; Fibromyalgia syndrome; Chronic musculoskeletal pain; Chronic ulcer of right leg (HCC); Meralgia paresthetica (Right); Chronic pain of right ankle; Arthritis; Left ankle pain; Left hemiparesis (HCC); Primary osteoarthritis of left shoulder; Rotator cuff disorder, left; Ulcer of left heel and midfoot (HCC); PAD (peripheral artery disease) (HCC);  Peripheral neurogenic pain; Chronic peripheral neuropathic pain; Chronic shoulder pain (Bilateral); Impaired range of motion of shoulder (Left); and Abnormal MRI, shoulder on their pertinent problem list. Pain Assessment: Severity of Chronic pain is reported as a 8 /10. Location: Shoulder Left, Right/pain radiaties down her left arm to her forearn. Onset: More than a month ago. Quality: Aching, Throbbing. Timing: Constant. Modifying factor(s): Meds and laying down. Vitals:  height is 5\' 4"  (1.626 m) and weight is 267 lb (121.1 kg). Her temperature is 97.9 F (36.6 C). Her blood pressure is 153/90 (abnormal) and her pulse is 96. Her oxygen saturation is 71% (abnormal).  BMI: Estimated body mass index is 45.83 kg/m as calculated from the following:   Height as of this encounter: 5\' 4"  (1.626 m).   Weight as of this encounter: 267 lb (121.1 kg). Last encounter: 05/25/2022. Last procedure: Visit date not found.  Reason for encounter: medication management.  The patient indicates doing well with the current medication regimen. No adverse reactions or side effects reported to the medications.   The patient indicated today having an upcoming shoulder surgery.  She was provided with the handouts for the surgeon on how to assist him in the management of the postop pain on a chronic pain patient.  Today I was informed by the nursing staff that the patient's husband was very upset and wanted to enter into a discussion with me about the patient has medications.  The patient has medications where I adjusted to reflect what her average she uses.  In the past she used to get quite a bit more medication, but it became evident that there was a certain degree of hoarding of the medication since she was not really keeping her medication management appointments.  Some of that had to do with the stroke that she had, but on the other hand we  were also keeping track through the PMP and her pill counts.  Today I had the patient  in my schedule set up for her routine UDS.  As it turns out the patient indicated that just before coming into the clinic she voided.  She has been coming to this practice since before 2017 and she is well aware that as part of our monitoring, we required the patient is to bring their pills to be counted, their old bottles to be inspected, and they need to be able to provide [place] with a urine sample for unannounced urine drug screening test.  Therefore, all of my patients know that they need to try to hold it until they get here and if they are told that we will not be needing a sample that day, they can go ahead and void.  Because she voided before coming in we had to provide her some fluids and we asked her to stay until she could give [place] that sample.  Eventually she did.  At one point, the patient requested the assistance of her husband within the bathroom to help her with getting the sample.  Again the nursing staff indicated that he was quite unhappy with [place] and at 1 point he suggested simply providing a sample of his urine.  He was informed that this was clearly unacceptable.  The situation was such that the staff was very concerned about him coming in to confront me about some of the issues that he was unhappy with.  For this reason, a one point the staff stopped him when he was on his way to another patient's room where I was conducting an interview with this all the patient and apparently his intention was to completely disregard patient confidentiality and invade other patients space and time.  For this reason the nursing staff reasoned with him and kept him from escalating things.  In the past I have had some disagreements with the patient in terms of the therapy that I was willing to offer versus what they wanted to do and at that time I reminded them that they always have the option to look for another provider.   Routine UDS ordered today.   RTCB: 11/29/2022   Pharmacotherapy Assessment   Analgesic: Hydrocodone/APAP 5/325 mg, 1 tab PO QD PRN (5 mg/day of hydrocodone) MME/day: 5 mg/day.   Monitoring:  PMP: PDMP reviewed during this encounter.       Pharmacotherapy: No side-effects or adverse reactions reported. Compliance: No problems identified. Effectiveness: Clinically acceptable.  Brigitte Pulse, RN  08/26/2022  1:59 PM  Sign when Signing Visit Nursing Pain Medication Assessment:  Safety precautions to be maintained throughout the outpatient stay will include: orient to surroundings, keep bed in low position, maintain call bell within reach at all times, provide assistance with transfer out of bed and ambulation.  Medication Inspection Compliance: Pill count conducted under aseptic conditions, in front of the patient. Neither the pills nor the bottle was removed from the patient's sight at any time. Once count was completed pills were immediately returned to the patient in their original bottle.  Medication: Hydrocodone/APAP Pill/Patch Count:  0 of 45 pills remain Pill/Patch Appearance: Markings consistent with prescribed medication Bottle Appearance: Standard pharmacy container. Clearly labeled. Filled Date: 5 / 15 / 2024 Last Medication intake:  TodaySafety precautions to be maintained throughout the outpatient stay will include: orient to surroundings, keep bed in low position, maintain call bell within reach at all times, provide assistance with  transfer out of bed and ambulation.     No results found for: "CBDTHCR" No results found for: "D8THCCBX" No results found for: "D9THCCBX"  UDS:  Summary  Date Value Ref Range Status  10/29/2021 Note  Final    Comment:    ==================================================================== ToxASSURE Select 13 (MW) ==================================================================== Test                             Result       Flag       Units  Drug Present and Declared for Prescription Verification   Alprazolam                      45           EXPECTED   ng/mg creat   Alpha-hydroxyalprazolam        62           EXPECTED   ng/mg creat    Source of alprazolam is a scheduled prescription medication. Alpha-    hydroxyalprazolam is an expected metabolite of alprazolam.    Hydrocodone                    652          EXPECTED   ng/mg creat   Dihydrocodeine                 232          EXPECTED   ng/mg creat   Norhydrocodone                 537          EXPECTED   ng/mg creat    Sources of hydrocodone include scheduled prescription medications.    Dihydrocodeine and norhydrocodone are expected metabolites of    hydrocodone. Dihydrocodeine is also available as a scheduled    prescription medication.  ==================================================================== Test                      Result    Flag   Units      Ref Range   Creatinine              65               mg/dL      >=40 ==================================================================== Declared Medications:  The flagging and interpretation on this report are based on the  following declared medications.  Unexpected results may arise from  inaccuracies in the declared medications.   **Note: The testing scope of this panel includes these medications:   Alprazolam (Xanax)  Hydrocodone (Norco)   **Note: The testing scope of this panel does not include the  following reported medications:   Acetaminophen (Tylenol)  Acetaminophen (Norco)  Albuterol (Proventil HFA)  Apixaban (Eliquis)  Benzonatate (Tessalon)  Bupropion (Wellbutrin XL)  Calcium  Cholecalciferol  Clobetasol (Temovate)  Colchicine  Duloxetine (Cymbalta)  Estradiol (Estrace)  Fluconazole (Diflucan)  Fluticasone (Trelegy)  Folic Acid  Furosemide (Lasix)  Levocetirizine (Xyzal)  Levothyroxine (Synthroid)  Losartan (Cozaar)  Metformin (Glucophage)  Metoprolol (Toprol)  Montelukast (Singulair)  Multivitamin  Mupirocin (Bactroban)  Nystatin (Mycostatin)   Ondansetron (Zofran)  Pantoprazole (Protonix)  Pregabalin (Lyrica)  Rosuvastatin (Crestor)  Sitagliptin (Januvia)  Sucralfate (Carafate)  Supplement  Topical  Topical Diclofenac (Voltaren)  Umeclidinium (Trelegy)  Vibegron (Gemtesa)  Vilanterol (Trelegy)  Vitamin B1  Vitamin D  Vitamin D2 ==================================================================== For  clinical consultation, please call [phone]. ====================================================================       ROS  Constitutional: Denies any fever or chills Gastrointestinal: No reported hemesis, hematochezia, vomiting, or acute GI distress Musculoskeletal: Denies any acute onset joint swelling, redness, loss of ROM, or weakness Neurological: No reported episodes of acute onset apraxia, aphasia, dysarthria, agnosia, amnesia, paralysis, loss of coordination, or loss of consciousness  Medication Review  ALPRAZolam, DULoxetine, Fluticasone-Umeclidin-Vilant, FreeStyle Libre 3 Sensor, Gold Bond No Mess Body Powder, HYDROcodone-acetaminophen, Hair Skin & Nails, Loperamide-Simethicone, Magnesium, Tub Transfer Board, Vibegron, acetaminophen, albuterol, apixaban, buPROPion, calcium-vitamin D, cephALEXin, clobetasol cream, colchicine, cyanocobalamin, diclofenac Sodium, empagliflozin, enoxaparin, ergocalciferol, estradiol, ferrous sulfate, fluconazole, folic acid, furosemide, glucose blood, ipratropium-albuterol, levocetirizine, levothyroxine, losartan, montelukast, multivitamin with minerals, naloxone, nystatin, nystatin ointment, ondansetron, onetouch ultrasoft, pantoprazole, pregabalin, rosuvastatin, sucralfate, and tirzepatide  History Review  Allergy: Ms. Tivnan is allergic to morphine and codeine, penicillins, ambien [zolpidem], aspirin, and oxytetracycline. Drug: Ms. Hambleton  reports no history of drug use. Alcohol:  reports no history of alcohol use. Tobacco:  reports that she has never smoked. She has never  used smokeless tobacco. Social: Ms. Macklin  reports that she has never smoked. She has never used smokeless tobacco. She reports that she does not drink alcohol and does not use drugs. Medical:  has a past medical history of Abnormal antibody titer, Anemia, Anxiety and depression, Arthritis, Asthma, Atypical chest pain, Carotid arterial disease (HCC), CHF (congestive heart failure) (HCC), Chronic heart failure with preserved ejection fraction (HFpEF) (HCC), CKD (chronic kidney disease), stage III (HCC), Complication of anesthesia, Cystocele, Depression, Diabetes (HCC), DM (diabetes mellitus) type II, controlled, with peripheral vascular disorder (HCC) (08/31/2014), DVT (deep venous thrombosis) (HCC), Dyspnea, Edema, Fatty liver, GERD (gastroesophageal reflux disease), Heart murmur, History of IBS, History of kidney stones, History of methicillin resistant staphylococcus aureus (MRSA) (2008), History of multiple strokes, Hyperlipidemia, Hypertension, Hypothyroidism, Insomnia, Kidney stone, Mitral regurgitation, Neuropathy involving both lower extremities, Non-healing ulcer of left foot (HCC), Obesity, Class II, BMI 35-39.9, with comorbidity, PAF (paroxysmal atrial fibrillation) (HCC), PAH (pulmonary artery hypertension) (HCC), Peripheral vascular disease (HCC), Sleep apnea, Stroke (HCC), Tricuspid regurgitation, Urinary incontinence, and Venous stasis dermatitis of both lower extremities. Surgical: Ms. Gausman  has a past surgical history that includes Ankle surgery (Right); Vaginal hysterectomy; Sleeve Gastroplasty; Lithotripsy; Kidney surgery (Right); transthoracic echocardiogram (08/03/2013); transesophageal echocardiogram (08/08/2013); Tonsillectomy; Total knee arthroplasty (Left); Hiatal hernia repair; Cholecystectomy; I & D extremity (Left, 06/27/2015); Application if wound vac (Left, 06/27/2015); removal of left hematoma (Left); NM GATED MYOVIEW [facility] HX) (February 2017); Reverse shoulder arthroplasty (Right,  10/08/2015); Hernia repair; Ulnar nerve transposition (Right, 08/06/2016); arm surgery; Irrigation and debridement abscess (Left, 09/07/2016); Incision and drainage of wound (Left, 09/25/2016); Application if wound vac (Left, 09/25/2016); Colonoscopy; Upper gi endoscopy; Esophagogastroduodenoscopy (egd) with propofol (N/A, 01/11/2017); Colonoscopy with propofol (N/A, 01/11/2017); Colonoscopy with propofol (N/A, 02/22/2017); OTHER SURGICAL HISTORY; Total shoulder replacement (Right); Joint replacement (2013); Colonoscopy with propofol (N/A, 05/31/2017); Lower Extremity Angiography (Left, 04/04/2018); Esophagogastroduodenoscopy (egd) with propofol (N/A, 10/25/2018); Incision and drainage of wound (Left, 11/09/2018); Esophagogastroduodenoscopy (egd) with propofol (N/A, 11/29/2019); Esophagogastroduodenoscopy (egd) with propofol (N/A, 01/26/2022); and Colonoscopy with propofol (N/A, 01/26/2022). Family: family history includes Alcohol abuse in her father; Arthritis in her maternal grandfather, maternal grandmother, mother, paternal grandfather, and paternal grandmother; Breast cancer in her maternal aunt; Cancer in her father and maternal aunt; Cerebral aneurysm in her father; Diabetes in her father, maternal grandmother, and paternal grandmother; Heart disease in her maternal grandfather; Hypertension in her father, maternal grandfather,  maternal grandmother, paternal grandfather, and paternal grandmother; Kidney disease in her mother; Mental illness in her sister; Peripheral vascular disease in her father; Skin cancer in her father; Varicose Veins in her mother.  Laboratory Chemistry Profile   Renal Lab Results  Component Value Date   BUN 30 (H) 08/19/2022   CREATININE 1.63 (H) 08/19/2022   LABCREA 48 08/22/2020   BCR 27 06/20/2021   GFR 41.16 (L) 11/27/2021   GFRAA 58 (L) 12/25/2019   GFRNONAA 33 (L) 08/19/2022    Hepatic Lab Results  Component Value Date   AST 30 08/19/2022   ALT 21 08/19/2022   ALBUMIN 4.4  08/19/2022   ALKPHOS 76 08/19/2022   LIPASE 27 07/28/2018   AMMONIA <9 (L) 02/07/2020    Electrolytes Lab Results  Component Value Date   NA 140 08/19/2022   K 4.7 08/19/2022   CL 99 08/19/2022   CALCIUM 9.7 08/19/2022   MG 1.9 06/08/2021   PHOS 4.0 06/08/2021    Bone Lab Results  Component Value Date   VD25OH 54.75 11/27/2021    Inflammation (CRP: Acute Phase) (ESR: Chronic Phase) Lab Results  Component Value Date   CRP 3.1 (H) 04/08/2020   ESRSEDRATE 60 (H) 04/08/2020   LATICACIDVEN 1.1 12/20/2019         Note: Above Lab results reviewed.  Recent Imaging Review  CT SHOULDER LEFT WO CONTRAST CLINICAL DATA:  Preoperative planning for left shoulder replacement. Worsening shoulder pain for 4 years. No known injury.  EXAM: CT OF THE UPPER LEFT EXTREMITY WITHOUT CONTRAST  TECHNIQUE: Multidetector CT imaging of the upper left extremity was performed according to the standard protocol.  RADIATION DOSE REDUCTION: This exam was performed according to the departmental dose-optimization program which includes automated exposure control, adjustment of the mA and/or kV according to patient size and/or use of iterative reconstruction technique.  COMPARISON:  MRI shoulder 01/30/2021  FINDINGS: Bones/Joint/Cartilage  No fracture or dislocation. Normal alignment. No joint effusion.  Severe osteoarthritis of the glenohumeral joint.  Mild arthropathy of the acromioclavicular joint. Insert into note made of an os acromiale.  Ligaments  Ligaments are suboptimally evaluated by CT.  Muscles and Tendons Muscles are normal. No muscle atrophy. Rotator cuff is suboptimally evaluated by CT. Suggestion of a full-thickness tear of the posterior aspect of the supraspinatus tendon.  Soft tissue No fluid collection or hematoma. No soft tissue mass. Visualized left lung demonstrates mild interstitial thickening. Thoracic aortic atherosclerosis.  IMPRESSION: 1. Severe  osteoarthritis of the glenohumeral joint. 2. Suggestion of a full-thickness tear of the posterior aspect of the supraspinatus tendon. 3.  Aortic Atherosclerosis (ICD10-I70.0).  Electronically Signed   By: Elige Ko M.D.   On: 08/08/2022 07:20 Note: Reviewed        Physical Exam  General appearance: Well nourished, well developed, and well hydrated. In no apparent acute distress Mental status: Alert, oriented x 3 (person, place, & time)       Respiratory: No evidence of acute respiratory distress Eyes: PERLA Vitals: BP (!) 153/90   Pulse 96   Temp 97.9 F (36.6 C)   Ht 5\' 4"  (1.626 m)   Wt 267 lb (121.1 kg)   SpO2 (!) 71%   BMI 45.83 kg/m  BMI: Estimated body mass index is 45.83 kg/m as calculated from the following:   Height as of this encounter: 5\' 4"  (1.626 m).   Weight as of this encounter: 267 lb (121.1 kg). Ideal: Ideal body weight: 54.7 kg (120 lb 9.5  oz) Adjusted ideal body weight: 81.3 kg (179 lb 2.5 oz)  Assessment   Diagnosis Status  1. Chronic pain syndrome   2. Pharmacologic therapy   3. Chronic musculoskeletal pain   4. Chronic shoulder pain (Bilateral)   5. Encounter for medication management   6. Encounter for chronic pain management   7. Osteoarthritis involving multiple joints   8. Chronic use of opiate for therapeutic purpose    Controlled Controlled Controlled   Updated Problems: No problems updated.  Plan of Care  Problem-specific:  No problem-specific Assessment & Plan notes found for this encounter.  Ms. JOHNNY BERTIN has a current medication list which includes the following long-term medication(s): albuterol, bupropion, calcium-vitamin d, duloxetine, eliquis, enoxaparin, furosemide, hydrocodone-acetaminophen, levocetirizine, levothyroxine, losartan, montelukast, pantoprazole, pregabalin, rosuvastatin, gemtesa, [START ON 08/31/2022] hydrocodone-acetaminophen, [START ON 09/30/2022] hydrocodone-acetaminophen, and [START ON 10/30/2022]  hydrocodone-acetaminophen.  Pharmacotherapy (Medications Ordered): Meds ordered this encounter  Medications   HYDROcodone-acetaminophen (NORCO/VICODIN) 5-325 MG tablet    Sig: Take 1 tablet by mouth 2 (two) times daily as needed for severe pain. Must last 30 days    Dispense:  45 tablet    Refill:  0    DO NOT: delete (not duplicate); no partial-fill (will deny script to complete), no refill request (F/U required). DISPENSE: 1 day early if closed on fill date. WARN: No CNS-depressants within 8 hrs of med.   HYDROcodone-acetaminophen (NORCO/VICODIN) 5-325 MG tablet    Sig: Take 1 tablet by mouth 2 (two) times daily as needed for severe pain. Must last 30 days    Dispense:  45 tablet    Refill:  0    DO NOT: delete (not duplicate); no partial-fill (will deny script to complete), no refill request (F/U required). DISPENSE: 1 day early if closed on fill date. WARN: No CNS-depressants within 8 hrs of med.   HYDROcodone-acetaminophen (NORCO/VICODIN) 5-325 MG tablet    Sig: Take 1 tablet by mouth 2 (two) times daily as needed for severe pain. Must last 30 days    Dispense:  45 tablet    Refill:  0    DO NOT: delete (not duplicate); no partial-fill (will deny script to complete), no refill request (F/U required). DISPENSE: 1 day early if closed on fill date. WARN: No CNS-depressants within 8 hrs of med.   naloxone (NARCAN) nasal spray 4 mg/0.1 mL    Sig: Place 1 spray into the nose as needed for up to 365 doses (for opioid-induced respiratory depresssion). In case of emergency (overdose), spray once into each nostril. If no response within 3 minutes, repeat application and call 911.    Dispense:  1 each    Refill:  0    Instruct patient in proper use of device.   Orders:  Orders Placed This Encounter  Procedures   ToxASSURE Select 13 (MW), Urine    Volume: 30 ml(s). Minimum 3 ml of urine is needed. Document temperature of fresh sample. Indications: Long term (current) use of opiate analgesic  (Z61.096)    Order Specific Question:   Release to patient    Answer:   Immediate   Nursing Instructions:    1). STAT: UDS required today. 2). Make sure to document all opioids and benzodiazepines taken, including time of last intake. 3). If order is entered on a procedure day, make sure sample is obtained before any medications are administered.   Follow-up plan:   Return in about 3 months (around 11/29/2022) for Eval-day (M,W), (F2F), (MM).  Interventional Therapies  Risk Factors  Considerations:   Anticoagulation: Eliquis (Stop: 3 days  Restart: 6 hours)  A-Fib  Hx. DVT  Hx. Stroke/CVA Allergy: PCN  MSO4  Ambien  ASA   Class 3 MO (BMI>40)  BNZ use  Stage 3b CKD  COPD  SOB  T2DM  Depression/Anxiety  GERD  NO LUMBAR RFA until BMI < 35.   Planned  Pending:      Under consideration:   Diagnostic left suprascapular NB   Completed:   None since before 2017   Completed by other providers:   Therapeutic left IA shoulder joint injection x1 (May 2023) by Dr. Bufford Buttner [facility])    Therapeutic  Palliative (PRN) options:   None at this time   Pharmacotherapy  Nonopioids transferred 02/19/2020: Lyrica and Cymbalta        Recent Visits No visits were found meeting these conditions. Showing recent visits within past 90 days and meeting all other requirements Today's Visits Date Type Provider Dept  08/26/22 Office Visit Delano Metz, MD [facility]  Showing today's visits and meeting all other requirements Future Appointments Date Type Provider Dept  09/30/22 Appointment Delano Metz, MD [facility]  Showing future appointments within next 90 days and meeting all other requirements  I discussed the assessment and treatment plan with the patient. The patient was provided an opportunity to ask questions and all were answered. The patient agreed with the plan and demonstrated an understanding of the  instructions.  Patient advised to call back or seek an in-person evaluation if the symptoms or condition worsens.  Duration of encounter: 30 minutes.  Total time on encounter, as per AMA guidelines included both the face-to-face and non-face-to-face time personally spent by the physician and/or other qualified health care professional(s) on the day of the encounter (includes time in activities that require the physician or other qualified health care professional and does not include time in activities normally performed by clinical staff). Physician's time may include the following activities when performed: Preparing to see the patient (e.g., pre-charting review of records, searching for previously ordered imaging, lab work, and nerve conduction tests) Review of prior analgesic pharmacotherapies. Reviewing PMP Interpreting ordered tests (e.g., lab work, imaging, nerve conduction tests) Performing post-procedure evaluations, including interpretation of diagnostic procedures Obtaining and/or reviewing separately obtained history Performing a medically appropriate examination and/or evaluation Counseling and educating the patient/family/caregiver Ordering medications, tests, or procedures Referring and communicating with other health care professionals (when not separately reported) Documenting clinical information in the electronic or other health record Independently interpreting results (not separately reported) and communicating results to the patient/ family/caregiver Care coordination (not separately reported)  Note by: Oswaldo Done, MD Date: 08/26/2022; Time: 3:50 PM

## 2022-08-26 NOTE — Patient Instructions (Signed)
____________________________________________________________________________________________  Naloxone Nasal Spray  Why am I receiving this medication? [place] STOP ACT requires that all patients taking high dose opioids or at risk of opioids respiratory depression, be prescribed an opioid reversal agent, such as Naloxone (AKA: Narcan).  What is this medication? NALOXONE (nal OX one) treats opioid overdose, which causes slow or shallow breathing, severe drowsiness, or trouble staying awake. Call emergency services after using this medication. You may need additional treatment. Naloxone works by reversing the effects of opioids. It belongs to a group of medications called opioid blockers.  COMMON BRAND NAME(S): Kloxxado, Narcan  What should I tell my care team before I take this medication? They need to know if you have any of these conditions: Heart disease Substance use disorder An unusual or allergic reaction to naloxone, other medications, foods, dyes, or preservatives Pregnant or trying to get pregnant Breast-feeding  When to use this medication? This medication is to be used for the treatment of respiratory depression (less than 8 breaths per minute) secondary to opioid overdose.   How to use this medication? This medication is for use in the nose. Lay the person on their back. Support their neck with your hand and allow the head to tilt back before giving the medication. The nasal spray should be given into 1 nostril. After giving the medication, move the person onto their side. Do not remove or test the nasal spray until ready to use. Get emergency medical help right away after giving the first dose of this medication, even if the person wakes up. You should be familiar with how to recognize the signs and symptoms of a narcotic overdose. If more doses are needed, give the additional dose in the other nostril. Talk to your care team about the use of this medication in children.  While this medication may be prescribed for children as young as newborns for selected conditions, precautions do apply.  Naloxone Overdosage: If you think you have taken too much of this medicine contact a poison control center or emergency room at once.  NOTE: This medicine is only for you. Do not share this medicine with others.  What if I miss a dose? This does not apply.  What may interact with this medication? This is only used during an emergency. No interactions are expected during emergency use. This list may not describe all possible interactions. Give your health care provider a list of all the medicines, herbs, non-prescription drugs, or dietary supplements you use. Also tell them if you smoke, drink alcohol, or use illegal drugs. Some items may interact with your medicine.  What should I watch for while using this medication? Keep this medication ready for use in the case of an opioid overdose. Make sure that you have the phone number of your care team and local hospital ready. You may need to have additional doses of this medication. Each nasal spray contains a single dose. Some emergencies may require additional doses. After use, bring the treated person to the nearest hospital or call 911. Make sure the treating care team knows that the person has received a dose of this medication. You will receive additional instructions on what to do during and after use of this medication before an emergency occurs.  What side effects may I notice from receiving this medication? Side effects that you should report to your care team as soon as possible: Allergic reactions--skin rash, itching, hives, swelling of the face, lips, tongue, or throat Side effects that usually   do not require medical attention (report these to your care team if they continue or are bothersome): Constipation Dryness or irritation inside the nose Headache Increase in blood pressure Muscle spasms Stuffy  nose Toothache This list may not describe all possible side effects. Call your doctor for medical advice about side effects. You may report side effects to FDA at 1-800-FDA-1088.  Where should I keep my medication? Because this is an emergency medication, you should keep it with you at all times.  Keep out of the reach of children and pets. Store between 20 and 25 degrees C (68 and 77 degrees F). Do not freeze. Throw away any unused medication after the expiration date. Keep in original box until ready to use.  NOTE: This sheet is a summary. It may not cover all possible information. If you have questions about this medicine, talk to your doctor, pharmacist, or health care provider.   2023 Elsevier/Gold Standard (2020-11-15 00:00:00)  ____________________________________________________________________________________________   ____________________________________________________________________________________________  Transfer of Pain Medication between Pharmacies  Re: 2023 DEA Clarification on existing regulation  Published on DEA Website: November 21, 2021  Title: Revised Regulation Allows DEA-Registered Pharmacies to Transfer Electronic Prescriptions at a Patient's Request DEA Headquarters Division - Public Information Office  "Patients now have the ability to request their electronic prescription be transferred to another pharmacy without having to go back to their practitioner to initiate the request. This revised regulation went into effect on Monday, November 17, 2021.     At a patient's request, a DEA-registered retail pharmacy can now transfer an electronic prescription for a controlled substance (schedules II-V) to another DEA-registered retail pharmacy. Prior to this change, patients would have to go through their practitioner to cancel their prescription and have it re-issued to a different pharmacy. The process was taxing and time consuming for both patients and  practitioners.    The Drug Enforcement Administration (DEA) published its intent to revise the process for transferring electronic prescriptions on February 09, 2020.  The final rule was published in the federal register on October 16, 2021 and went into effect 30 days later.  Under the final rule, a prescription can only be transferred once between pharmacies, and only if allowed under existing state or other applicable law. The prescription must remain in its electronic form; may not be altered in any way; and the transfer must be communicated directly between two licensed pharmacists. It's important to note, any authorized refills transfer with the original prescription, which means the entire prescription will be filled at the same pharmacy."    REFERENCES: 1. DEA website announcement https://www.dea.gov/stories/2023/2023-11/2021-09-01/revised-regulation-allows-dea-registered-pharmacies-transfer  2. Department of Justice website  https://www.govinfo.gov/content/pkg/FR-2021-10-16/pdf/2023-15847.pdf  3. DEPARTMENT OF JUSTICE Drug Enforcement Administration 21 CFR Part 1306 [Docket No. DEA-637] RIN [id number] "Transfer of Electronic Prescriptions for Schedules II-V Controlled Substances Between Pharmacies for Initial Filling"  ____________________________________________________________________________________________     ____________________________________________________________________________________________  Patient Information update  To: All of our patients.  Re: Name change.  It has been made official that our current name, "[facility]"   will soon be changed to "[facility]".   The purpose of this change is to eliminate any confusion created by the concept of our practice being a "Medication Management Pain Clinic". In the past this has led to the misconception that  we treat pain primarily by the use of prescription medications.  Nothing can be farther from the truth.   Understanding PAIN MANAGEMENT: To further   understand what our practice does, you first have to understand that "Pain Management" is a subspecialty that requires additional training once a physician has completed their specialty training, which can be in either Anesthesia, Neurology, Psychiatry, or Physical Medicine and Rehabilitation (PMR). Each one of these contributes to the final approach taken by each physician to the management of their patient's pain. To be a "Pain Management Specialist" you must have first completed one of the specialty trainings below.  Anesthesiologists - trained in clinical pharmacology and interventional techniques such as nerve blockade and regional as well as central neuroanatomy. They are trained to block pain before, during, and after surgical interventions.  Neurologists - trained in the diagnosis and pharmacological treatment of complex neurological conditions, such as Multiple Sclerosis, Parkinson's, spinal cord injuries, and other systemic conditions that may be associated with symptoms that may include but are not limited to pain. They tend to rely primarily on the treatment of chronic pain using prescription medications.  Psychiatrist - trained in conditions affecting the psychosocial wellbeing of patients including but not limited to depression, anxiety, schizophrenia, personality disorders, addiction, and other substance use disorders that may be associated with chronic pain. They tend to rely primarily on the treatment of chronic pain using prescription medications.   Physical Medicine and Rehabilitation (PMR) physicians, also known as physiatrists - trained to treat a wide variety of medical conditions affecting the brain, spinal cord, nerves, bones, joints, ligaments, muscles, and tendons. Their training is primarily aimed at treating patients that have  suffered injuries that have caused severe physical impairment. Their training is primarily aimed at the physical therapy and rehabilitation of those patients. They may also work alongside orthopedic surgeons or neurosurgeons using their expertise in assisting surgical patients to recover after their surgeries.  INTERVENTIONAL PAIN MANAGEMENT is sub-subspecialty of Pain Management.  Our physicians are Board-certified in Anesthesia, Pain Management, and Interventional Pain Management.  This meaning that not only have they been trained and Board-certified in their specialty of Anesthesia, and subspecialty of Pain Management, but they have also received further training in the sub-subspecialty of Interventional Pain Management, in order to become Board-certified as INTERVENTIONAL PAIN MANAGEMENT SPECIALIST.    Mission: Our goal is to use our skills in  INTERVENTIONAL PAIN MANAGEMENT as alternatives to the chronic use of prescription opioid medications for the treatment of pain. To make this more clear, we have changed our name to reflect what we do and offer. We will continue to offer medication management assessment and recommendations, but we will not be taking over any patient's medication management.  ____________________________________________________________________________________________   ____________________________________________________________________________________________  Opioid Pain Medication Update  To: All patients taking opioid pain medications. (I.e.: hydrocodone, hydromorphone, oxycodone, oxymorphone, morphine, codeine, methadone, tapentadol, tramadol, buprenorphine, fentanyl, etc.)  Re: Updated review of side effects and adverse reactions of opioid analgesics, as well as new information about long term effects of this class of medications.  Direct risks of long-term opioid therapy are not limited to opioid addiction and overdose. Potential medical risks include serious  fractures, breathing problems during sleep, hyperalgesia, immunosuppression, chronic constipation, bowel obstruction, myocardial infarction, and tooth decay secondary to xerostomia.  Unpredictable adverse effects that can occur even if you take your medication correctly: Cognitive impairment, respiratory depression, and death. Most people think that if they take their medication "correctly", and "as instructed", that they will be safe. Nothing could be farther from the truth. In reality, a significant amount of recorded deaths associated with the use of opioids has occurred   in individuals that had taken the medication for a long time, and were taking their medication correctly. The following are examples of how this can happen: Patient taking his/her medication for a long time, as instructed, without any side effects, is given a certain antibiotic or another unrelated medication, which in turn triggers a "Drug-to-drug interaction" leading to disorientation, cognitive impairment, impaired reflexes, respiratory depression or an untoward event leading to serious bodily harm or injury, including death.  Patient taking his/her medication for a long time, as instructed, without any side effects, develops an acute impairment of liver and/or kidney function. This will lead to a rapid inability of the body to breakdown and eliminate their pain medication, which will result in effects similar to an "overdose", but with the same medicine and dose that they had always taken. This again may lead to disorientation, cognitive impairment, impaired reflexes, respiratory depression or an untoward event leading to serious bodily harm or injury, including death.  A similar problem will occur with patients as they grow older and their liver and kidney function begins to decrease as part of the aging process.  Background information: Historically, the original case for using long-term opioid therapy to treat chronic noncancer  pain was based on safety assumptions that subsequent experience has called into question. In 1996, the American Pain Society and the American Academy of Pain Medicine issued a consensus statement supporting long-term opioid therapy. This statement acknowledged the dangers of opioid prescribing but concluded that the risk for addiction was low; respiratory depression induced by opioids was short-lived, occurred mainly in opioid-naive patients, and was antagonized by pain; tolerance was not a common problem; and efforts to control diversion should not constrain opioid prescribing. This has now proven to be wrong. Experience regarding the risks for opioid addiction, misuse, and overdose in community practice has failed to support these assumptions.  According to the Centers for Disease Control and Prevention, fatal overdoses involving opioid analgesics have increased sharply over the past decade. Currently, more than 96,700 people die from drug overdoses every year. Opioids are a factor in 7 out of every 10 overdose deaths. Deaths from drug overdose have surpassed motor vehicle accidents as the leading cause of death for individuals between the ages of 35 and 54.  Clinical data suggest that neuroendocrine dysfunction may be very common in both men and women, potentially causing hypogonadism, erectile dysfunction, infertility, decreased libido, osteoporosis, and depression. Recent studies linked higher opioid dose to increased opioid-related mortality. Controlled observational studies reported that long-term opioid therapy may be associated with increased risk for cardiovascular events. Subsequent meta-analysis concluded that the safety of long-term opioid therapy in elderly patients has not been proven.   Side Effects and adverse reactions: Common side effects: Drowsiness (sedation). Dizziness. Nausea and vomiting. Constipation. Physical dependence -- Dependence often manifests with withdrawal symptoms when  opioids are discontinued or decreased. Tolerance -- As you take repeated doses of opioids, you require increased medication to experience the same effect of pain relief. Respiratory depression -- This can occur in healthy people, especially with higher doses. However, people with COPD, asthma or other lung conditions may be even more susceptible to fatal respiratory impairment.  Uncommon side effects: An increased sensitivity to feeling pain and extreme response to pain (hyperalgesia). Chronic use of opioids can lead to this. Delayed gastric emptying (the process by which the contents of your stomach are moved into your small intestine). Muscle rigidity. Immune system and hormonal dysfunction. Quick, involuntary muscle jerks (myoclonus). Arrhythmia. Itchy   skin (pruritus). Dry mouth (xerostomia).  Long-term side effects: Chronic constipation. Sleep-disordered breathing (SDB). Increased risk of bone fractures. Hypothalamic-pituitary-adrenal dysregulation. Increased risk of overdose.  RISKS: Fractures and Falls:  Opioids increase the risk and incidence of falls. This is of particular importance in elderly patients.  Endocrine System:  Long-term administration is associated with endocrine abnormalities (endocrinopathies). (Also known as Opioid-induced Endocrinopathy) Influences on both the hypothalamic-pituitary-adrenal axis?and the hypothalamic-pituitary-gonadal axis have been demonstrated with consequent hypogonadism and adrenal insufficiency in both sexes. Hypogonadism and decreased levels of dehydroepiandrosterone sulfate have been reported in men and women. Endocrine effects include: Amenorrhoea in women (abnormal absence of menstruation) Reduced libido in both sexes Decreased sexual function Erectile dysfunction in men Hypogonadisms (decreased testicular function with shrinkage of testicles) Infertility Depression and fatigue Loss of muscle mass Anxiety Depression Immune  suppression Hyperalgesia Weight gain Anemia Osteoporosis Patients (particularly women of childbearing age) should avoid opioids. There is insufficient evidence to recommend routine monitoring of asymptomatic patients taking opioids in the long-term for hormonal deficiencies.  Immune System: Human studies have demonstrated that opioids have an immunomodulating effect. These effects are mediated via opioid receptors both on immune effector cells and in the central nervous system. Opioids have been demonstrated to have adverse effects on antimicrobial response and anti-tumour surveillance. Buprenorphine has been demonstrated to have no impact on immune function.  Opioid Induced Hyperalgesia: Human studies have demonstrated that prolonged use of opioids can lead to a state of abnormal pain sensitivity, sometimes called opioid induced hyperalgesia (OIH). Opioid induced hyperalgesia is not usually seen in the absence of tolerance to opioid analgesia. Clinically, hyperalgesia may be diagnosed if the patient on long-term opioid therapy presents with increased pain. This might be qualitatively and anatomically distinct from pain related to disease progression or to breakthrough pain resulting from development of opioid tolerance. Pain associated with hyperalgesia tends to be more diffuse than the pre-existing pain and less defined in quality. Management of opioid induced hyperalgesia requires opioid dose reduction.  Cancer: Chronic opioid therapy has been associated with an increased risk of cancer among noncancer patients with chronic pain. This association was more evident in chronic strong opioid users. Chronic opioid consumption causes significant pathological changes in the small intestine and colon. Epidemiological studies have found that there is a link between opium dependence and initiation of gastrointestinal cancers. Cancer is the second leading cause of death after cardiovascular disease.  Chronic use of opioids can cause multiple conditions such as GERD, immunosuppression and renal damage as well as carcinogenic effects, which are associated with the incidence of cancers.   Mortality: Long-term opioid use has been associated with increased mortality among patients with chronic non-cancer pain (CNCP).  Prescription of long-acting opioids for chronic noncancer pain was associated with a significantly increased risk of all-cause mortality, including deaths from causes other than overdose.  Reference: Von Korff M, Kolodny A, Deyo RA, Chou R. Long-term opioid therapy reconsidered. Ann Intern Med. 2011 Sep 6;155(5):325-8. doi: 10.7326/[id number]. PMID: 21893626; PMCID: PMC3280085. Bedson J, Chen Y, Ashworth J, Hayward RA, Dunn KM, Jordan KP. Risk of adverse events in patients prescribed long-term opioids: A cohort study in the [place] Clinical Practice Research D[employer]. Eur J Pain. 2019 May;23(5):908-922. doi: 10.1002/ejp.1357. Epub 2019 Jan 31. PMID: 30620116. Colameco S, Coren JS, Ciervo CA. Continuous opioid treatment for chronic noncancer pain: a time for moderation in prescribing. Postgrad Med. 2009 Jul;121(4):61-6. doi: 10.3810/pgm.2009.07.2032. PMID: 19641271. Chou R, Turner JA, Devine EB, Hansen RN, Sullivan SD, Blazina I, Dana T, Bougatsos C, Deyo   RA. The effectiveness and risks of long-term opioid therapy for chronic pain: a systematic review for a National Institutes of Health Pathways to Prevention Workshop. Ann Intern Med. 2015 Feb 17;162(4):276-86. doi: 10.7326/M14-2559. PMID: 25581257. Warner M, Chen LH, Makuc DM. NCHS Data Brief No. 22. [place]: Centers for Disease Control and Prevention; 2009. Sep, Increase in Fatal Poisonings Involving Opioid Analgesics in the [place], 1999-2006. Song IA, Choi HR, Oh TK. Long-term opioid use and mortality in patients with chronic non-cancer pain: Ten-year follow-up study in [place] from 2010 through 2019.  EClinicalMedicine. 2022 Jul 18;51:101558. doi: 10.1016/j.eclinm.2022.101558. PMID: 35875817; PMCID: PMC9304910. Huser, W., Schubert, T., Vogelmann, T. et al. All-cause mortality in patients with long-term opioid therapy compared with non-opioid analgesics for chronic non-cancer pain: a database study. BMC Med 18, 162 (2020). https://doi.org/10.1186/[email or web address] Rashidian H, Zendehdel K, Kamangar F, Malekzadeh R, Haghdoost AA. An Ecological Study of the Association between Opiate Use and Incidence of Cancers. Addict Health. 2016 Fall;8(4):252-260. PMID: 28819556; PMCID: PMC5554805.  Our Goal: Our goal is to control your pain with means other than the use of opioid pain medications.  Our Recommendation: Talk to your physician about coming off of these medications. We can assist you with the tapering down and stopping these medicines. Based on the new information, even if you cannot completely stop the medication, a decrease in the dose may be associated with a lesser risk. Ask for other means of controlling the pain. Decrease or eliminate those factors that significantly contribute to your pain such as smoking, obesity, and a diet heavily tilted towards "inflammatory" nutrients.  Last Updated: 05/20/2022   ____________________________________________________________________________________________       

## 2022-08-26 NOTE — Telephone Encounter (Signed)
Patient is asking why she has to come back in 1 month instead of 3. Dr Laban Emperor said schedule before 09-30-22. Please advise patient.

## 2022-08-26 NOTE — Telephone Encounter (Signed)
Should have been 3.

## 2022-08-26 NOTE — Progress Notes (Signed)
Nursing Pain Medication Assessment:  Safety precautions to be maintained throughout the outpatient stay will include: orient to surroundings, keep bed in low position, maintain call bell within reach at all times, provide assistance with transfer out of bed and ambulation.  Medication Inspection Compliance: Pill count conducted under aseptic conditions, in front of the patient. Neither the pills nor the bottle was removed from the patient's sight at any time. Once count was completed pills were immediately returned to the patient in their original bottle.  Medication: Hydrocodone/APAP Pill/Patch Count:  0 of 45 pills remain Pill/Patch Appearance: Markings consistent with prescribed medication Bottle Appearance: Standard pharmacy container. Clearly labeled. Filled Date: 5 / 15 / 2024 Last Medication intake:  TodaySafety precautions to be maintained throughout the outpatient stay will include: orient to surroundings, keep bed in low position, maintain call bell within reach at all times, provide assistance with transfer out of bed and ambulation.

## 2022-08-27 ENCOUNTER — Encounter: Payer: Self-pay | Admitting: Orthopedic Surgery

## 2022-08-27 NOTE — Progress Notes (Signed)
Perioperative / Anesthesia Services  Pre-Admission Testing Clinical Review / Preoperative Anesthesia Consult  Date: 08/27/22  Patient Demographics:  Name: [name] DOB:   [birth year] MRN:   [id number]  Planned Surgical Procedure(s):    Case: [id number] Date/Time: 09/01/22 0715   Procedure: REVERSE SHOULDER ARTHROPLASTY (Left: Shoulder)   Anesthesia type: Choice   Pre-op diagnosis: Primary osteoarthritis of left shoulder M19.012   Location: [facility] OR ROOM 01 / [facility]   Surgeons: Signa Kell, MD     NOTE: Available PAT nursing documentation and vital signs have been reviewed. Clinical nursing staff has updated patient's PMH/PSHx, current medication list, and drug allergies/intolerances to ensure comprehensive history available to assist in medical decision making as it pertains to the aforementioned surgical procedure and anticipated anesthetic course. Extensive review of available clinical information personally performed. [place] PMH and PSHx updated with any diagnoses/procedures that  may have been inadvertently omitted during her intake with the pre-admission testing department's nursing staff.  Clinical Discussion:  [name] is a 72 y.o. adult who is submitted for pre-surgical anesthesia review and clearance prior to her undergoing the above procedure. Patient has never been a smoker. Pertinent PMH includes: HFpEF, PAF, multiple CVAs, PVD, carotid artery disease, atypical angina, cardiac murmur, HTN, HLD, T2DM, hypothyroidism, CKD-III, pulmonary hypertension, dyspnea, asthma, diaphragm paralysis, OSAH (requires nocturnal PAP therapy), GERD (on daily PPI), hiatal hernia (s/p repair), anemia, peripheral edema, LEFT hemiparesis as a late effect of previous CVA, lower extremity neuropathy, nephrolithiasis, OA, anxiety (on BZO PRN), depression, insomnia.  Patient is followed by cardiology Kirke Corin, MD). She was last seen in the cardiology clinic on  07/31/2022; notes reviewed. At the time of her clinic visit, patient doing well overall from a cardiovascular perspective. Patient denied any chest pain, shortness of breath, PND, orthopnea, palpitations, significant peripheral edema, weakness, fatigue, vertiginous symptoms, or presyncope/syncope. Patient with a past medical history significant for cardiovascular diagnoses. Documented physical exam was grossly benign, providing no evidence of acute exacerbation and/or decompensation of the patient's known cardiovascular conditions.  Patient with a history of multiple CVAs in the past.  Patient is able to ambulate, however she has hemiparesis affecting the LEFT side of her body as a late effect of previous neurological events.  Patient requires the assistance of a standard walker for safe ambulation.  Myocardial perfusion imaging study performed on 03/28/2018 revealed a normal left ventricular systolic function with a hyperdynamic LVEF of >65%.  There was no evidence of stress-induced myocardial ischemia or arrhythmia; no scintigraphic evidence of scar.  Baseline sinus bradycardia at a rate of 47 bpm noted.  Study determined to be normal and low risk.  Patient with known history of carotid artery disease.  Last carotid Doppler was performed on 04/08/2020 revealing mild plaque at the level of the RIGHT carotid bifurcation with estimated stenosis of the proximal RIGHT ICA being <50%.  There was no evidence of contralateral stenosis and the LICA.  Most recent TTE was performed on 06/03/2021 revealing a normal left ventricular systolic function with an EF of 55 to 60%.  There were no regional wall motion abnormalities.  Left ventricular diastolic Doppler parameters indeterminate.  Right ventricular size and function normal.  PASP elevated at 54.4 mmHg consistent with known pulmonary hypertension.  Left atrium mildly dilated.  There was mild mitral valve regurgitation.  All transvalvular gradients were noted to be  normal providing no evidence suggestive of valvular stenosis.  Aorta normal in size with no evidence of  aneurysmal dilatation.  Patient with an atrial fibrillation diagnosis; CHA2DS2-VASc Score = 8 (age, sex, CHF, HTN, CVA x 2, vascular disease history, T2DM).cardiac rate and rhythm currently being maintained without the use of pharmacological intervention.  Patient is chronically anticoagulated using standard dose apixaban; reportedly compliant with therapy with no evidence or reports of GI bleeding.  At 109/69 mmHg on currently prescribed blood pressure well-controlled diuretic (furosemide) and ARB (losartan) therapies.  She is on rosuvastatin for her HLD diagnosis and further ASCVD prevention.  T2DM loosely controlled on currently prescribed regimen; last HgbA1c was 7.8% when checked on 08/13/2022.  In the setting of known cardiovascular diagnoses and concurrent T2DM, patient is on an SGLT2i (empagliflozin) for added cardiovascular and renovascular protection.  Patient does have an OSAH diagnosis and is reported to be compliant with prescribed nocturnal PAP therapy.  Patient is followed by pulmonary medicine for RIGHT hemidiaphragmatic paralysis and asthma.  Functional capacity limited by patient's age, previous CVAs with resulting hemiparesis, chronic pain, and other various multiple medical comorbidities.  Based on the DASI, patient is unable to achieve 4 METS of physical activity without experiencing angina/anginal equivalent symptoms.  No changes were made to her medication regimen.  Patient to follow-up with outpatient cardiology in 6 months or sooner if needed.  [name] is scheduled for an elective REVERSE SHOULDER ARTHROPLASTY (Left: Shoulder) on 09/01/2022 with Dr. Signa Kell, MD.  Given patient's past medical history significant for cardiovascular and cardiopulmonary diagnoses, presurgical clearances were sought from patient's pulmonary medicine and cardiology teams.  Specialty clearances  were obtained as follows:  Per pulmonary medicine Jayme Cloud, MD), "the patient is a HIGH risk for potential complications from shoulder surgery and unfortunately the main issues that increase her risk cannot be further optimized.  These issues include right hemidiaphragm paresis, morbid obesity, deconditioning, and sleep apnea.  The main issues here will be atelectasis and high risk for infection postop particularly pneumonia.  Asthma/asthmatic bronchitis is actually the least of her issues currently as she appears to be well-controlled. Recommend against interscalene nerve block given the complication of right hemidiaphragm paresthesias after her shoulder arthroplasty in 2017. Repeat PFTs show that she has severe restriction (lack of ability to take a deep breath), which is in part due to weight and in part due to the diaphragm that was paralyzed from her prior surgery. This is what makes her higher risk for surgery. Her component of asthma is not very bad".  Per cardiology Fransico Michael, PA-C), "METS 3.63. According to the RCRI patient is Class III risk with 10.1% risk of death, MI or cardiac arrest.  No prior cardiac work-up required prior to surgery. Patient will need bridging with Lovenox if Eliquis is interrupted given high CHADSVASC (h/o stroke)".  In review of her medication reconciliation, it is noted that patient is currently on prescribed daily oral anticoagulation therapy. She has been instructed on recommendations for holding her apixaban for 2 days prior to her procedure with plans to restart as soon as postoperative bleeding risk felt to be minimized by her attending surgeon. The patient has been instructed that her last dose of her apixaban should be on 08/29/2022. Given her high risk of CVA off anticoagulation, patient will require bridging with enoxaparin for this procedure.  Pharmacist from cardiology has spoken with patient and provided her with appropriate bridging instructions.  Patient  reports previous perioperative complications with anesthesia in the past.  Patient has experienced paralysis of her RIGHT hemidiaphragm as a result of an interscalene block  used in the past for a total shoulder arthroplasty.  Patient adamantly refuses further neuraxial anesthesia. In review of the available records, it is noted that patient underwent a general anesthetic course here at [facility] ([facility] III) in 01/2022 without documented complications.      08/26/2022    1:58 PM 08/19/2022    3:09 PM 08/13/2022    3:04 PM  Vitals with BMI  Height 5\' 4"  5\' 5"  5' 5.5"  Weight 267 lbs 267 lbs 267 lbs  BMI 45.81 44.43 43.74  Systolic 153 121 161  Diastolic 90 78 60  Pulse 96 82 70    Providers/Specialists:   NOTE: Primary physician provider listed below. Patient may have been seen by APP or partner within same practice.   PROVIDER ROLE / SPECIALTY LAST Sarina Ser, MD Orthopedics (Surgeon) 08/19/2022  [name], NP Primary Care Provider 08/13/2022  Lorine Bears, MD Cardiology 07/31/2022  Hoy Morn, MD Pain Management 08/26/2022  Sarina Ser, MD Pulmonary Medicine 07/30/2022  Cristopher Peru, MD Neurology 05/26/2021   Allergies:  Morphine and codeine, Penicillins, Ambien [zolpidem], Aspirin, and Oxytetracycline  Current Home Medications:   No current facility-administered medications for this encounter.    acetaminophen (TYLENOL) 325 MG tablet   albuterol (VENTOLIN HFA) 108 (90 Base) MCG/ACT inhaler   ALPRAZolam (XANAX) 0.25 MG tablet   buPROPion (WELLBUTRIN XL) 150 MG 24 hr tablet   calcium-vitamin D (OSCAL WITH D) 500-200 MG-UNIT tablet   cephALEXin (KEFLEX) 250 MG capsule   clobetasol cream (TEMOVATE) 0.05 %   colchicine 0.6 MG tablet   Continuous Glucose Sensor (FREESTYLE LIBRE 3 SENSOR) MISC   cyanocobalamin (VITAMIN B12) 1000 MCG tablet   diclofenac Sodium (VOLTAREN) 1 % GEL   DULoxetine (CYMBALTA) 60 MG capsule    ELIQUIS 5 MG TABS tablet   empagliflozin (JARDIANCE) 25 MG TABS tablet   enoxaparin (LOVENOX) 120 MG/0.8ML injection   ergocalciferol (VITAMIN D2) 1.25 MG (50000 UT) capsule   estradiol (ESTRACE) 0.1 MG/GM vaginal cream   ferrous sulfate 324 MG TBEC   fluconazole (DIFLUCAN) 150 MG tablet   Fluticasone-Umeclidin-Vilant (TRELEGY ELLIPTA) 200-62.5-25 MCG/ACT AEPB   folic acid (FOLVITE) 800 MCG tablet   furosemide (LASIX) 40 MG tablet   glucose blood (ONETOUCH ULTRA) test strip   HYDROcodone-acetaminophen (NORCO/VICODIN) 5-325 MG tablet   [START ON 08/31/2022] HYDROcodone-acetaminophen (NORCO/VICODIN) 5-325 MG tablet   [START ON 09/30/2022] HYDROcodone-acetaminophen (NORCO/VICODIN) 5-325 MG tablet   [START ON 10/30/2022] HYDROcodone-acetaminophen (NORCO/VICODIN) 5-325 MG tablet   ipratropium-albuterol (DUONEB) 0.5-2.5 (3) MG/3ML SOLN   Lancets (ONETOUCH ULTRASOFT) lancets   levocetirizine (XYZAL) 5 MG tablet   levothyroxine (SYNTHROID) 137 MCG tablet   Loperamide-Simethicone 2-125 MG TABS   losartan (COZAAR) 50 MG tablet   Magnesium ([employer] TRIPLE MAGNESIUM COMPLEX) 400 MG CAPS   Misc. Devices (TUB TRANSFER BOARD) MISC   montelukast (SINGULAIR) 10 MG tablet   Multiple Vitamin (MULTIVITAMIN WITH MINERALS) TABS tablet   Multiple Vitamins-Minerals (HAIR SKIN & NAILS) TABS   naloxone (NARCAN) nasal spray 4 mg/0.1 mL   nystatin (MYCOSTATIN/NYSTOP) powder   nystatin ointment (MYCOSTATIN)   ondansetron (ZOFRAN) 4 MG tablet   pantoprazole (PROTONIX) 40 MG tablet   Powders (GOLD BOND NO MESS BODY POWDER) AERP   pregabalin (LYRICA) 50 MG capsule   rosuvastatin (CRESTOR) 20 MG tablet   sucralfate (CARAFATE) 1 GM/10ML suspension   tirzepatide (MOUNJARO) 10 MG/0.5ML Pen   tirzepatide (MOUNJARO) 7.5 MG/0.5ML Pen   Vibegron (GEMTESA) 75 MG TABS  History:   Past Medical History:  Diagnosis Date   Anemia    Anxiety    a.) on BZO (alprazolam) PRN   Arthritis    Asthma    Atypical chest pain     a. 03/2018 MV: No ischemia/infarct. EF >65%.   Carotid arterial disease (HCC)    a. 03/2020 U/S: RICA <50, LICA nl.   Chronic heart failure with preserved ejection fraction (HFpEF) (HCC)    a. 08/2013 Echo: EF 60-65%; b. 08/2019 Echo: EF 60-65%; c. 03/2020 Echo: EF 60-65%; d. 05/2021 Echo: EF 55-60%, no rmwa, nl RV fxn, RVSP 54.29mmHg. Mildly dil LA, mild MR, mod TR.   Chronic, continuous use of opioids    a.) has naloxone Rx available   CKD (chronic kidney disease), stage III (HCC)    Complication of anesthesia    a.) RIGHT hemidiaphragm paralysis s/p interscalene block for shoulder arthroplasty in 2017 --> caused "permanent lung damage" per patient --> REFUSES further neuraxial anesthesia   Cystocele    Deep vein thrombosis (DVT) of right lower extremity (HCC)    Depression    Diaphragm paralysis 2018   a.) RIGHT hemidiaphragm paralysis s/p interscalene block for shoulder arthroplasty in 2017; refuses additional regional/neuraxial anesthesia   DM (diabetes mellitus) type II, controlled, with peripheral vascular disorder (HCC) 08/31/2014   Dyspnea    11/08/2018 - "more now due to lack of exercise"   Edema    Fatty liver    GERD (gastroesophageal reflux disease)    Gout    Heart murmur    Hemiparesis affecting left side as late effect of cerebrovascular accident (HCC)    Hiatal hernia    a.) s/p repair   History of IBS    History of methicillin resistant staphylococcus aureus (MRSA) 2008   History of multiple strokes    Hyperlipidemia    Hypertension    Hypothyroidism    Insomnia    Long term current use of anticoagulant    a.) apixaban   Mitral regurgitation    a. 05/2021 Echo: Mild MR.   Nephrolithiasis    Neuropathy involving both lower extremities    Non-healing ulcer of left foot (HCC)    Obesity, Class II, BMI 35-39.9, with comorbidity    OSA on CPAP    PAF (paroxysmal atrial fibrillation) (HCC)    a.) 07/2013 Event monitor: Freq PVCs and 3 short runs of Afib; b.) CHA2DS2VASc =  8 (age, sex, CHF, HTN, CVA/DVT x2, vascular disease history, T2DM);  b.) rate/rhythm maintained without pharmacological intervention; chronically anticoagulated with apixaban   PAH (pulmonary artery hypertension) (HCC)    a. 05/2021 Echo: RVSP 54.70mmHg.   Peripheral vascular disease (HCC)    a. 03/2018 LE Angio: Nl renal arteries. Nl aorta & iliacs. Nl LLE arterial flow; b. 06/2020 ABI's: Nl bilat.   T2DM (type 2 diabetes mellitus) (HCC)    Tricuspid regurgitation    a. 05/2021 Echo: Mod TR (in setting of RVSP 54.65mmHg).   Umbilical hernia    a.) s/p repair   Urinary incontinence    Venous stasis dermatitis of both lower extremities    Past Surgical History:  Procedure Laterality Date   ANKLE SURGERY Right    APPLICATION OF WOUND VAC Left 06/27/2015   Procedure: APPLICATION OF WOUND VAC ( POSSIBLE ) ;  Surgeon: Annice Needy, MD;  Location: [facility] ORS;  Service: Vascular;  Laterality: Left;   APPLICATION OF WOUND VAC Left 09/25/2016   Procedure: APPLICATION OF WOUND VAC;  Surgeon: Recardo Evangelist, DPM;  Location: [facility] ORS;  Service: Podiatry;  Laterality: Left;   arm surgery     right     CHOLECYSTECTOMY     COLONOSCOPY     COLONOSCOPY WITH PROPOFOL N/A 01/11/2017   Procedure: COLONOSCOPY WITH PROPOFOL;  Surgeon: Christena Deem, MD;  Location[facility] ENDOSCOPY;  Service: Endoscopy;  Laterality: N/A;   COLONOSCOPY WITH PROPOFOL N/A 02/22/2017   Procedure: COLONOSCOPY WITH PROPOFOL;  Surgeon: Christena Deem, MD;  Location: [facility] ENDOSCOPY;  Service: Endoscopy;  Laterality: N/A;   COLONOSCOPY WITH PROPOFOL N/A 05/31/2017   Procedure: COLONOSCOPY WITH PROPOFOL;  Surgeon: Christena Deem, MD;  Location: [facility] ENDOSCOPY;  Service: Endoscopy;  Laterality: N/A;   COLONOSCOPY WITH PROPOFOL N/A 01/26/2022   Procedure: COLONOSCOPY WITH PROPOFOL;  Surgeon: Regis Bill, MD;  Location: [facility] ENDOSCOPY;  Service: Endoscopy;  Laterality: N/A;   ESOPHAGOGASTRODUODENOSCOPY (EGD) WITH PROPOFOL  N/A 01/11/2017   Procedure: ESOPHAGOGASTRODUODENOSCOPY (EGD) WITH PROPOFOL;  Surgeon: Christena Deem, MD;  Location: [facility] ENDOSCOPY;  Service: Endoscopy;  Laterality: N/A;   ESOPHAGOGASTRODUODENOSCOPY (EGD) WITH PROPOFOL N/A 10/25/2018   Procedure: ESOPHAGOGASTRODUODENOSCOPY (EGD) WITH PROPOFOL;  Surgeon: Christena Deem, MD;  Location: [facility] ENDOSCOPY;  Service: Endoscopy;  Laterality: N/A;   ESOPHAGOGASTRODUODENOSCOPY (EGD) WITH PROPOFOL N/A 11/29/2019   Procedure: ESOPHAGOGASTRODUODENOSCOPY (EGD) WITH PROPOFOL;  Surgeon: Tol[place], Boykin Nearing, MD;  Location: [facility] ENDOSCOPY;  Service: Gastroenterology;  Laterality: N/A;   ESOPHAGOGASTRODUODENOSCOPY (EGD) WITH PROPOFOL N/A 01/26/2022   Procedure: ESOPHAGOGASTRODUODENOSCOPY (EGD) WITH PROPOFOL;  Surgeon: Regis Bill, MD;  Location: [facility] ENDOSCOPY;  Service: Endoscopy;  Laterality: N/A;  DM   HIATAL HERNIA REPAIR     I & D EXTREMITY Left 06/27/2015   Procedure: IRRIGATION AND DEBRIDEMENT EXTREMITY            ( CALF HEMATOMA ) POSSIBLE WOUND VAC;  Surgeon: Annice Needy, MD;  Location: [facility] ORS;  Service: Vascular;  Laterality: Left;   INCISION AND DRAINAGE OF WOUND Left 09/25/2016   Procedure: IRRIGATION AND DEBRIDEMENT - PARTIAL RESECTION OF ACHILLES TENDON WITH WOUND VAC APPLICATION;  Surgeon: Recardo Evangelist, DPM;  Location: [facility] ORS;  Service: Podiatry;  Laterality: Left;   INCISION AND DRAINAGE OF WOUND Left 11/09/2018   Procedure: Excision of left foot wound with ACell placement;  Surgeon: Peggye Form, DO;  Location: [facility] OR;  Service: Plastics;  Laterality: Left;   IRRIGATION AND DEBRIDEMENT ABSCESS Left 09/07/2016   Procedure: IRRIGATION AND DEBRIDEMENT ABSCESS LEFT HEEL;  Surgeon: Recardo Evangelist, DPM;  Location: [facility] ORS;  Service: Podiatry;  Laterality: Left;   KIDNEY STONE SURGERY Right    LITHOTRIPSY     LOWER EXTREMITY ANGIOGRAPHY Left 04/04/2018   Procedure: LOWER EXTREMITY ANGIOGRAPHY;  Surgeon: Annice Needy, MD;   Location: [facility] INVASIVE CV LAB;  Service: Cardiovascular;  Laterality: Left;   NM GATED MYOVIEW ([facility] HX)  04/2015   Likely breast attenuation. LOW RISK study. Normal EF 55-60%.   OTHER SURGICAL HISTORY     08/2016 or 09/2016 surgery on achilles tenso h/o staph infection heal. Dr. Orland Jarred   removal of left hematoma Left    leg   REVERSE SHOULDER ARTHROPLASTY Right 10/08/2015   Procedure: REVERSE SHOULDER ARTHROPLASTY;  Surgeon: Christena Flake, MD;  Location: [facility] ORS;  Service: Orthopedics;  Laterality: Right;   SLEEVE GASTROPLASTY     TONSILLECTOMY     TOTAL KNEE ARTHROPLASTY Left    TOTAL KNEE ARTHROPLASTY  2013   TOTAL SHOULDER REPLACEMENT Right  TRANSESOPHAGEAL ECHOCARDIOGRAM  08/08/2013   Mild LVH, EF 60-65%. Moderate LA dilation and mild RA dilation. Mild MR with no evidence of stenosis and no evidence of endocarditis. A false chordae is noted.   TRANSTHORACIC ECHOCARDIOGRAM  08/03/2013   Mild-Moderate concentric LVH, EF 60-65%. Normal diastolic function. Mild LA dilation. Mild MR with possible vegetation  - not confirmed on TEE    ULNAR NERVE TRANSPOSITION Right 08/06/2016   Procedure: ULNAR NERVE DECOMPRESSION/TRANSPOSITION;  Surgeon: Christena Flake, MD;  Location: [facility] ORS;  Service: Orthopedics;  Laterality: Right;   UMBILICAL HERNIA REPAIR     UPPER GI ENDOSCOPY     VAGINAL HYSTERECTOMY     Family History  Problem Relation Age of Onset   Skin cancer Father    Diabetes Father    Hypertension Father    Peripheral vascular disease Father    Cancer Father    Cerebral aneurysm Father    Alcohol abuse Father    Varicose Veins Mother    Kidney disease Mother    Arthritis Mother    Mental illness Sister    Cancer Maternal Aunt        breast   Breast cancer Maternal Aunt    Arthritis Maternal Grandmother    Hypertension Maternal Grandmother    Diabetes Maternal Grandmother    Arthritis Maternal Grandfather    Heart disease Maternal Grandfather    Hypertension Maternal  Grandfather    Arthritis Paternal Grandmother    Hypertension Paternal Grandmother    Diabetes Paternal Grandmother    Arthritis Paternal Grandfather    Hypertension Paternal Grandfather    Bladder Cancer Neg Hx    Kidney cancer Neg Hx    Social History   Tobacco Use   Smoking status: Never   Smokeless tobacco: Never  Vaping Use   Vaping Use: Never used  Substance Use Topics   Alcohol use: No    Alcohol/week: 0.0 standard drinks of alcohol   Drug use: Never    Pertinent Clinical Results:  LABS:   No visits with results within 3 Day(s) from this visit.  Latest known visit with results is:  Hospital Outpatient Visit on 08/19/2022  Component Date Value Ref Range Status   WBC 08/19/2022 7.3  4.0 - 10.5 K/uL Final   RBC 08/19/2022 5.01  3.87 - 5.11 MIL/uL Final   Hemoglobin 08/19/2022 14.3  12.0 - 15.0 g/dL Final   HCT 16/12/9602 46.9 (H)  36.0 - 46.0 % Final   MCV 08/19/2022 93.6  80.0 - 100.0 fL Final   MCH 08/19/2022 28.5  26.0 - 34.0 pg Final   MCHC 08/19/2022 30.5  30.0 - 36.0 g/dL Final   RDW 54/11/8117 13.8  11.5 - 15.5 % Final   Platelets 08/19/2022 236  150 - 400 K/uL Final   nRBC 08/19/2022 0.0  0.0 - 0.2 % Final   Neutrophils Relative % 08/19/2022 70  % Final   Neutro Abs 08/19/2022 5.1  1.7 - 7.7 K/uL Final   Lymphocytes Relative 08/19/2022 17  % Final   Lymphs Abs 08/19/2022 1.2  0.7 - 4.0 K/uL Final   Monocytes Relative 08/19/2022 8  % Final   Monocytes Absolute 08/19/2022 0.6  0.1 - 1.0 K/uL Final   Eosinophils Relative 08/19/2022 4  % Final   Eosinophils Absolute 08/19/2022 0.3  0.0 - 0.5 K/uL Final   Basophils Relative 08/19/2022 1  % Final   Basophils Absolute 08/19/2022 0.1  0.0 - 0.1 K/uL Final   Immature Granulocytes  08/19/2022 0  % Final   Abs Immature Granulocytes 08/19/2022 0.02  0.00 - 0.07 K/uL Final   Performed at [facility], [address] [place], [place] [address]   Sodium 08/19/2022 140  135 - 145 mmol/L Final   Potassium  08/19/2022 4.7  3.5 - 5.1 mmol/L Final   Chloride 08/19/2022 99  98 - 111 mmol/L Final   CO2 08/19/2022 30  22 - 32 mmol/L Final   Glucose, Bld 08/19/2022 113 (H)  70 - 99 mg/dL Final   Glucose reference range applies only to samples taken after fasting for at least 8 hours.   BUN 08/19/2022 30 (H)  8 - 23 mg/dL Final   Creatinine, Ser 08/19/2022 1.63 (H)  0.44 - 1.00 mg/dL Final   Calcium 60/45/4098 9.7  8.9 - 10.3 mg/dL Final   Total Protein 11/91/4782 7.4  6.5 - 8.1 g/dL Final   Albumin 95/62/1308 4.4  3.5 - 5.0 g/dL Final   AST 65/78/4696 30  15 - 41 U/L Final   ALT 08/19/2022 21  0 - 44 U/L Final   Alkaline Phosphatase 08/19/2022 76  38 - 126 U/L Final   Total Bilirubin 08/19/2022 0.8  0.3 - 1.2 mg/dL Final   GFR, Estimated 08/19/2022 33 (L)  >60 mL/min Final   Comment: (NOTE) Calculated using the CKD-EPI Creatinine Equation (2021)    Anion gap 08/19/2022 11  5 - 15 Final   Performed at [facility], [address] [place], [place] [address]   Color, Urine 08/19/2022 YELLOW (A)  YELLOW Final   APPearance 08/19/2022 CLEAR (A)  CLEAR Final   Specific Gravity, Urine 08/19/2022 1.012  1.005 - 1.030 Final   pH 08/19/2022 5.0  5.0 - 8.0 Final   Glucose, UA 08/19/2022 >=500 (A)  NEGATIVE mg/dL Final   Hgb urine dipstick 08/19/2022 NEGATIVE  NEGATIVE Final   Bilirubin Urine 08/19/2022 NEGATIVE  NEGATIVE Final   Ketones, ur 08/19/2022 NEGATIVE  NEGATIVE mg/dL Final   Protein, ur 41/32/4401 NEGATIVE  NEGATIVE mg/dL Final   Nitrite 02/72/5366 NEGATIVE  NEGATIVE Final   Leukocytes,Ua 08/19/2022 NEGATIVE  NEGATIVE Final   RBC / HPF 08/19/2022 0-5  0 - 5 RBC/hpf Final   WBC, UA 08/19/2022 0-5  0 - 5 WBC/hpf Final   Bacteria, UA 08/19/2022 RARE (A)  NONE SEEN Final   Squamous Epithelial / HPF 08/19/2022 0-5  0 - 5 /HPF Final   Mucus 08/19/2022 PRESENT   Final   Performed at [facility], [address] [place], [place] [address]   ABO/RH(D) 08/19/2022 AB POS   Final    Antibody Screen 08/19/2022 POS   Final   Sample Expiration 08/19/2022 09/02/2022,2359   Final   Extend sample reason 08/19/2022 NO TRANSFUSIONS OR PREGNANCY IN THE PAST 3 MONTHS   Final   Antibody Identification 08/19/2022    Final                   Value:ANTI E Performed at [facility], [address] [place], [place] [address]    MRSA, PCR 08/19/2022 NEGATIVE  NEGATIVE Final   Staphylococcus aureus 08/19/2022 NEGATIVE  NEGATIVE Final   Comment: (NOTE) The Xpert SA Assay (FDA approved for NASAL specimens in patients 46 years of age and older), is one component of a comprehensive surveillance program. It is not intended to diagnose infection nor to guide or monitor treatment. Performed at [facility], [address], [place], [place] [address]  ECG: Date: 07/31/2022 Time ECG obtained: 1534 PM Rate: 69 bpm Rhythm: atrial fibrillation Axis (leads I and aVF): Normal Intervals: QRS 88 ms. QTc 496 ms. ST segment and T wave changes: Nonspecific ST abnormality.  Low voltage QRS.   Comparison: Similar to previous tracing obtained on 03/10/2022   IMAGING / PROCEDURES: PULMONARY FUNCTION TESTING performed on 08/11/2022    Latest Ref Rng & Units 08/11/2022    2:48 PM  PFT Results  FVC-Pre L 1.50   FVC-Predicted Pre % 53   FVC-Post L 1.41   FVC-Predicted Post % 50   Pre FEV1/FVC % % 89   Post FEV1/FCV % % 88   FEV1-Pre L 1.34   FEV1-Predicted Pre % 63   FEV1-Post L 1.24   DLCO uncorrected ml/min/mmHg 10.70   DLCO UNC% % 57   DLVA Predicted % 83   TLC L 2.89   TLC % Predicted % 58   RV % Predicted % 43    TRANSTHORACIC ECHOCARDIOGRAM performed on 06/03/2021 Left ventricular ejection fraction, by estimation, is 55 to 60%. The left ventricle has normal function. The left ventricle has no regional wall motion abnormalities. Left ventricular diastolic parameters are indeterminate. Right ventricular systolic function is normal. The right ventricular  size is normal. There is moderately elevated pulmonary artery systolic pressure. The estimated right ventricular systolic pressure is 54.4 mmHg. Left atrial size was mildly dilated. The mitral valve is normal in structure. Mild mitral valve regurgitation. No evidence of mitral stenosis. Tricuspid valve regurgitation is moderate. The aortic valve is normal in structure. Aortic valve regurgitation is not visualized. No aortic stenosis is present. The inferior vena cava is dilated in size with >50% respiratory variability, suggesting right atrial pressure of 8 mmHg.  US CAROTID BILATERAL performed on 04/08/2020 Mild plaque at the level of the right carotid bifurcation with estimated proximal right ICA stenosis of less than 50%. No evidence of left-sided carotid plaque or stenosis in the neck.   MYOCARDIAL PERFUSION IMAGING STUDY (LEXISCAN) performed on 03/28/2018 Normal left ventricular systolic function with hyperdynamic LVEF of >65% Normal myocardial thickening and wall motion Left ventricular cavity size normal SPECT images demonstrate homogenous tracer distribution throughout the myocardium Sinus bradycardia at baseline with a rate of 47 bpm No evidence of stress-induced myocardial ischemia or arrhythmia Normal low risk study  Impression and Plan:  [name] has been referred for pre-anesthesia review and clearance prior to her undergoing the planned anesthetic and procedural courses. Available labs, pertinent testing, and imaging results were personally reviewed by me in preparation for upcoming operative/procedural course. [facility] medical record has been updated following extensive record review and patient interview with PAT staff.   This patient has been appropriately cleared by cardiology (ACCEPTABLE) and by pulmonary medicine (HIGH) with the individually indicated risk of significant perioperative cardiovascular and/or cardiopulmonary complications.  Of note, since the  diaphragmatic paralysis occurred back in 2017, patient has undergone multiple anesthetic courses without documented complications.    Based on clinical review performed today (08/27/22), barring any significant acute changes in the patient's overall condition, it is anticipated that she will be able to proceed with the planned surgical intervention. Any acute changes in clinical condition may necessitate her procedure being postponed and/or cancelled. Patient will meet with anesthesia team (MD and/or CRNA) on the day of her procedure for preoperative evaluation/assessment. Questions regarding anesthetic course will be fielded at that time.   Pre-surgical instructions were reviewed with the patient during her PAT appointment, and  questions were fielded to satisfaction by PAT clinical staff. She has been instructed on which medications that she will need to hold prior to surgery, as well as the ones that have been deemed safe/appropriate to take on the day of her procedure. As part of the general education provided by PAT, patient made aware both verbally and in writing, that she would need to abstain from the use of any illegal substances during her perioperative course.  She was advised that failure to follow the provided instructions could necessitate case cancellation or result in serious perioperative complications up to and including death. Patient encouraged to contact PAT and/or her surgeon's office to discuss any questions or concerns that may arise prior to surgery; verbalized understanding.   Quentin Mulling, MSN, APRN, FNP-C, CEN [facility]  Peri-operative Services Nurse Practitioner Phone: [phone] Fax: [phone] 08/27/22 5:29 PM  NOTE: This note has been prepared using Dragon dictation software. Despite my best ability to proofread, there is always the potential that unintentional transcriptional errors may still occur from this process.

## 2022-08-28 ENCOUNTER — Telehealth: Payer: Self-pay | Admitting: Nurse Practitioner

## 2022-08-28 NOTE — Telephone Encounter (Signed)
Pt called in stating that she has a red/rash spot under her left breast and she was wondering if they can sent some med over or like a cream for that to her pharmacy.?? I offered appts for today and she refused. As per pt, she has fam members coming over from [place].

## 2022-08-28 NOTE — Telephone Encounter (Signed)
Spoke with pt and [name] NP stated she can try using the nystatin powder under her breast. Pt verbalized understanding

## 2022-08-29 ENCOUNTER — Other Ambulatory Visit: Payer: Self-pay | Admitting: Nurse Practitioner

## 2022-08-29 DIAGNOSIS — B3731 Acute candidiasis of vulva and vagina: Secondary | ICD-10-CM

## 2022-08-30 LAB — TOXASSURE SELECT 13 (MW), URINE

## 2022-08-31 LAB — TYPE AND SCREEN

## 2022-08-31 LAB — BPAM RBC: Unit Type and Rh: 6200

## 2022-08-31 NOTE — Anesthesia Preprocedure Evaluation (Addendum)
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  These issues include right hemidiaphragm paresis, morbid obesity, deconditioning, and sleep apnea.  The main issues here will be atelectasis and high risk for infection postop particularly pneumonia.  Asthma/asthmatic bronchitis is actually the least of her issues currently as she appears to be well-controlled. Recommend against interscalene nerve block given the complication of right hemidiaphragm paresthesias after her shoulder arthroplasty in 2017. Repeat PFTs show that she has severe restriction (lack of ability to take a deep breath), which is in part due to weight and in part due to the diaphragm that was paralyzed from her prior surgery. This is what makes her higher risk for surgery. Her component of asthma is not very bad".    Pulmonary exam normal        Cardiovascular Exercise Tolerance: Poor hypertension, pulmonary hypertension+ Peripheral Vascular Disease (Carotid arterial disease 03/2020 U/S: RICA <50, LICA nl.) and +CHF (HFpEF)  + dysrhythmias (rate/rhythm  maintained without pharmacological intervention; chronically anticoagulated with apixaban) Atrial Fibrillation + Valvular Problems/Murmurs (mild) MR  Rhythm:Regular Rate:Normal  05/2021 Echo: EF 55-60%, no rmwa, nl RV fxn, RVSP 54.72mmHg. Mildly dil LA, mild MR, mod TR  MYOCARDIAL PERFUSION IMAGING STUDY (LEXISCAN) performed on 03/28/2018 1. Normal left ventricular systolic function with hyperdynamic LVEF of >65% 2. Normal myocardial thickening and wall motion 3. Left ventricular cavity size normal 4. SPECT images demonstrate homogenous tracer distribution throughout the myocardium 5. Sinus bradycardia at baseline with a rate of 47 bpm 6. No evidence of stress-induced myocardial ischemia or arrhythmia 7. Normal low risk study    Neuro/Psych  PSYCHIATRIC DISORDERS  Depression    Neuropathy involving both lower extremities  Neuromuscular disease CVA, Residual Symptoms    GI/Hepatic Neg liver ROS,GERD  Controlled,,S/p HH repair   Endo/Other  diabetes, Poorly Controlled, Type 2Hypothyroidism  Morbid obesityA1c 7.8  Renal/GU Renal InsufficiencyRenal disease  negative genitourinary   Musculoskeletal  (+) Arthritis ,    Abdominal  (+) + obese  Peds  Hematology  (+) Blood dyscrasia IDA treated with iron infusions   Anesthesia Other Findings Chronic use of opiate for therapeutic purpose   Per cardiology Fransico Michael, PA-C), "METS 3.63. According to the RCRI patient is Class III risk with 10.1% risk of death, MI or cardiac arrest.  No prior cardiac work-up required prior to surgery. Patient will need bridging with Lovenox if Eliquis is interrupted given high CHADSVASC (h/o stroke  Patient reports left shoulder pain and injury at baseline  Past Medical History: No date: Abnormal antibody titer No date: Anxiety and depression No date: Arthritis No date: Asthma No date: Atypical chest pain     Comment:  a. 03/2018 MV: No ischemia/infarct. EF >65%. No date:  Carotid arterial disease (HCC)      Comment:  a. 03/2020 U/S: RICA <50, LICA nl. No date: Chronic heart failure with preserved ejection fraction  (HFpEF) (HCC)     Comment:  a. 08/2013 Echo: EF 60-65%; b. 08/2019 Echo: EF 60-65%; c.              03/2020 Echo: EF 60-65%; d. 05/2021 Echo: EF 55-60%, no               rmwa, nl RV fxn, RVSP 54.83mmHg. Mildly dil LA, mild MR,               mod TR. No date: CKD (chronic kidney disease), stage III (HCC) No date: Cystocele No date: Depression No date: Diabetes (HCC) No date: DVT (deep venous thrombosis) (HCC)     Comment:  Righ calf No date: Dyspnea     Comment:  11/08/2018 - "more now due to lack of exercise" No date: Edema No date: GERD (gastroesophageal reflux disease) No date: Heart murmur No date: History of IBS No date: History of kidney stones 2008: History of methicillin resistant staphylococcus aureus (MRSA) No date: History of multiple strokes No date: Hyperlipidemia No date: Hypertension No date: Hypothyroidism No date: Insomnia No date: Kidney stone     Comment:  kidney stones with lithotripsy .  [facility]-               "adrenal glands" No date: Mitral regurgitation     Comment:  a. 05/2021 Echo: Mild MR. No date: Neuropathy involving both lower extremities No date: Non-healing ulcer of left foot (HCC) No date: Obesity, Class II, BMI 35-39.9, with comorbidity No date: PAF (paroxysmal atrial fibrillation) (HCC)     Comment:  a. 07/2013 Event monitor: Freq PVCs and 3 short runs of               Afib-->CHA2DS2VASc = 7-->Eliquis. No date: PAH (pulmonary artery hypertension) (HCC)     Comment:  a. 05/2021 Echo: RVSP 54.53mmHg. No date: Peripheral vascular disease (HCC)     Comment:  a. 03/2018 LE Angio: Nl renal arteries. Nl aorta &               iliacs. Nl LLE arterial flow; b. 06/2020 ABI's: Nl bilat. No date: Sleep apnea     Comment:  use C-PAP No date: Stroke [facility])     Comment:  03/2020 No date: Tricuspid regurgitation     Comment:  a. 05/2021 Echo: Mod TR  (in setting of RVSP 54.49mmHg). No date: Urinary incontinence No date: Venous stasis dermatitis of both lower extremities  Past Surgical History: No date: ANKLE SURGERY; Right 06/27/2015: APPLICATION OF WOUND VAC; Left     Comment:  Procedure: APPLICATION OF WOUND VAC ( POSSIBLE ) ;                Surgeon: Annice Needy, MD;  Location: [facility] ORS;  Service:               Vascular;  Laterality: Left; 09/25/2016: APPLICATION OF WOUND VAC; Left     Comment:  Procedure: APPLICATION OF WOUND VAC;  Surgeon: Recardo Evangelist, DPM;  Location: [facility] ORS;  Service: Podiatry;                Laterality: Left; No date: arm surgery     Comment:  right   No date: CHOLECYSTECTOMY No date:  COLONOSCOPY 01/11/2017: COLONOSCOPY WITH PROPOFOL; N/A     Comment:  Procedure: COLONOSCOPY WITH PROPOFOL;  Surgeon:               Christena Deem, MD;  [facility] END[facility];                Service: Endoscopy;  Laterality: N/A; 02/22/2017: COLONOSCOPY WITH PROPOFOL; N/A     Comment:  Procedure: COLONOSCOPY WITH PROPOFOL;  Surgeon:               Christena Deem, MD;  Location: [facility] ENDOSCOPY;                Service: Endoscopy;  Laterality: N/A; 05/31/2017: COLONOSCOPY WITH PROPOFOL; N/A     Comment:  Procedure: COLONOSCOPY WITH PROPOFOL;  Surgeon:               Christena Deem, MD;  Location: [facility] ENDOSCOPY;                Service: Endoscopy;  Laterality: N/A; 01/11/2017: ESOPHAGOGASTRODUODENOSCOPY (EGD) WITH PROPOFOL; N/A     Comment:  Procedure: ESOPHAGOGASTRODUODENOSCOPY (EGD) WITH               PROPOFOL;  Surgeon: Christena Deem, MD;  Location:               [facility] ENDOSCOPY;  Service: Endoscopy;  Laterality: N/A; 10/25/2018: ESOPHAGOGASTRODUODENOSCOPY (EGD) WITH PROPOFOL; N/A     Comment:  Procedure: ESOPHAGOGASTRODUODENOSCOPY (EGD) WITH               PROPOFOL;  Surgeon: Christena Deem, MD;  Location:               [facility] ENDOSCOPY;  Service: Endoscopy;  Laterality: N/A; 11/29/2019:  ESOPHAGOGASTRODUODENOSCOPY (EGD) WITH PROPOFOL; N/A     Comment:  Procedure: ESOPHAGOGASTRODUODENOSCOPY (EGD) WITH               PROPOFOL;  Surgeon: Toledo, Boykin Nearing, MD;  Location:               [facility] ENDOSCOPY;  Service: Gastroenterology;  Laterality:               N/A; No date: HERNIA REPAIR     Comment:  umbilical No date: HIATAL HERNIA REPAIR 06/27/2015: I & D EXTREMITY; Left     Comment:  Procedure: IRRIGATION AND DEBRIDEMENT EXTREMITY                       ( CALF HEMATOMA ) POSSIBLE WOUND VAC;  Surgeon: Annice Needy, MD;  Location: [facility] ORS;  Service: Vascular;                Laterality: Left; 09/25/2016: INCISION AND DRAINAGE OF WOUND; Left     Comment:  Procedure: IRRIGATION AND DEBRIDEMENT - PARTIAL               RESECTION OF ACHILLES TENDON WITH WOUND VAC APPLICATION;               Surgeon: Recardo Evangelist, DPM;  Location: [facility] ORS;                Service: Podiatry;  Laterality: Left; 11/09/2018: INCISION AND DRAINAGE OF WOUND; Left     Comment:  Procedure: Excision of left foot wound with ACell  placement;  Surgeon: Peggye Form, DO;  [facility];  Service: Plastics;  Laterality: Left; 09/07/2016: IRRIGATION AND DEBRIDEMENT ABSCESS; Left     Comment:  Procedure: IRRIGATION AND DEBRIDEMENT ABSCESS LEFT HEEL;              Surgeon: Recardo Evangelist, DPM;  Location: [facility] ORS;                Service: Podiatry;  Laterality: Left; 2013: JOINT REPLACEMENT     Comment:  left knee replacement No date: KIDNEY SURGERY; Right     Comment:  kidney stones No date: LITHOTRIPSY 04/04/2018: LOWER EXTREMITY ANGIOGRAPHY; Left     Comment:  Procedure: LOWER EXTREMITY ANGIOGRAPHY;  Surgeon: Annice Needy, MD;  Location: [facility] INVASIVE CV LAB;  Service:               Cardiovascular;  Laterality: Left; February 2017: NM GATED MYOVIEW [facility] HX)     Comment:  Likely breast attenuation. LOW RISK study. Normal EF               55-60%. No  date: OTHER SURGICAL HISTORY     Comment:  08/2016 or 09/2016 surgery on achilles tenso h/o staph               infection heal. Dr. Orland Jarred No date: removal of left hematoma; Left     Comment:  leg 10/08/2015: REVERSE SHOULDER ARTHROPLASTY; Right     Comment:  Procedure: REVERSE SHOULDER ARTHROPLASTY;  Surgeon: Christena Flake, MD;  Location: [facility] ORS;  Service: Orthopedics;               Laterality: Right; No date: SLEEVE GASTROPLASTY No date: TONSILLECTOMY No date: TOTAL KNEE ARTHROPLASTY; Left No date: TOTAL SHOULDER REPLACEMENT; Right 08/08/2013: TRANSESOPHAGEAL ECHOCARDIOGRAM     Comment:  Mild LVH, EF 60-65%. Moderate LA dilation and mild RA               dilation. Mild MR with no evidence of stenosis and no               evidence of endocarditis. A false chordae is noted. 08/03/2013: TRANSTHORACIC ECHOCARDIOGRAM     Comment:  Mild-Moderate concentric LVH, EF 60-65%. Normal               diastolic function. Mild LA dilation. Mild MR with               possible vegetation  - not confirmed on TEE  08/06/2016: ULNAR NERVE TRANSPOSITION; Right     Comment:  Procedure: ULNAR NERVE DECOMPRESSION/TRANSPOSITION;                Surgeon: Christena Flake, MD;  Location: [facility] ORS;                Service: Orthopedics;  Laterality: Right; No date: UPPER GI ENDOSCOPY No date: VAGINAL HYSTERECTOMY     Reproductive/Obstetrics negative OB ROS                             Anesthesia Physical Anesthesia Plan  ASA: 3  Anesthesia Plan: General   Post-op Pain Management: Ketamine IV*, Tylenol PO (pre-op)* and Regional block*   Induction:  Intravenous  PONV Risk Score and Plan: 2 and Ondansetron and Dexamethasone  Airway Management Planned: Oral ETT  Additional Equipment:   Intra-op Plan:   Post-operative Plan: Extubation in OR and Possible Post-op intubation/ventilation  Informed Consent: I have reviewed the patients History and Physical, chart, labs and  discussed the procedure including the risks, benefits and alternatives for the proposed anesthesia with the patient or authorized representative who has indicated his/her understanding and acceptance.     Dental Advisory Given  Plan Discussed with: Anesthesiologist, CRNA and Surgeon  Anesthesia Plan Comments: (The risks of pulmonary complications were discussed with the potential need for supplemental oxygen/bipap/ventilation that could increase risk for cardiac complications. Pt understands she is high risk but wishes to proceed. Her husbands agrees. )       Anesthesia Quick Evaluation

## 2022-09-01 ENCOUNTER — Observation Stay: Payer: Medicare HMO

## 2022-09-01 ENCOUNTER — Encounter: Payer: Self-pay | Admitting: Orthopedic Surgery

## 2022-09-01 ENCOUNTER — Ambulatory Visit: Payer: Medicare HMO | Admitting: Urgent Care

## 2022-09-01 ENCOUNTER — Encounter
Admission: RE | Disposition: A | Discharge status: Skilled Nursing Facility | Payer: Self-pay | Source: Home / Self Care | Attending: Orthopedic Surgery

## 2022-09-01 ENCOUNTER — Inpatient Hospital Stay
Admission: RE | Admit: 2022-09-01 | Discharge: 2022-09-07 | DRG: 483 | Disposition: A | Discharge status: Skilled Nursing Facility | Payer: Medicare HMO | Attending: Orthopedic Surgery | Admitting: Orthopedic Surgery

## 2022-09-01 ENCOUNTER — Other Ambulatory Visit: Payer: Self-pay

## 2022-09-01 DIAGNOSIS — Z86718 Personal history of other venous thrombosis and embolism: Secondary | ICD-10-CM

## 2022-09-01 DIAGNOSIS — Z9989 Dependence on other enabling machines and devices: Secondary | ICD-10-CM

## 2022-09-01 DIAGNOSIS — Z803 Family history of malignant neoplasm of breast: Secondary | ICD-10-CM

## 2022-09-01 DIAGNOSIS — J45909 Unspecified asthma, uncomplicated: Secondary | ICD-10-CM | POA: Diagnosis present

## 2022-09-01 DIAGNOSIS — M7522 Bicipital tendinitis, left shoulder: Secondary | ICD-10-CM | POA: Diagnosis not present

## 2022-09-01 DIAGNOSIS — I69354 Hemiplegia and hemiparesis following cerebral infarction affecting left non-dominant side: Secondary | ICD-10-CM

## 2022-09-01 DIAGNOSIS — I272 Pulmonary hypertension, unspecified: Secondary | ICD-10-CM | POA: Diagnosis present

## 2022-09-01 DIAGNOSIS — Z885 Allergy status to narcotic agent status: Secondary | ICD-10-CM

## 2022-09-01 DIAGNOSIS — I081 Rheumatic disorders of both mitral and tricuspid valves: Secondary | ICD-10-CM | POA: Diagnosis present

## 2022-09-01 DIAGNOSIS — Z471 Aftercare following joint replacement surgery: Secondary | ICD-10-CM | POA: Diagnosis not present

## 2022-09-01 DIAGNOSIS — E785 Hyperlipidemia, unspecified: Secondary | ICD-10-CM | POA: Diagnosis present

## 2022-09-01 DIAGNOSIS — E039 Hypothyroidism, unspecified: Secondary | ICD-10-CM | POA: Diagnosis present

## 2022-09-01 DIAGNOSIS — Z90710 Acquired absence of both cervix and uterus: Secondary | ICD-10-CM

## 2022-09-01 DIAGNOSIS — Z7901 Long term (current) use of anticoagulants: Secondary | ICD-10-CM

## 2022-09-01 DIAGNOSIS — E1122 Type 2 diabetes mellitus with diabetic chronic kidney disease: Secondary | ICD-10-CM | POA: Diagnosis present

## 2022-09-01 DIAGNOSIS — D509 Iron deficiency anemia, unspecified: Secondary | ICD-10-CM | POA: Diagnosis present

## 2022-09-01 DIAGNOSIS — B3731 Acute candidiasis of vulva and vagina: Secondary | ICD-10-CM

## 2022-09-01 DIAGNOSIS — E669 Obesity, unspecified: Secondary | ICD-10-CM | POA: Diagnosis present

## 2022-09-01 DIAGNOSIS — Z886 Allergy status to analgesic agent status: Secondary | ICD-10-CM

## 2022-09-01 DIAGNOSIS — Z833 Family history of diabetes mellitus: Secondary | ICD-10-CM

## 2022-09-01 DIAGNOSIS — Z96612 Presence of left artificial shoulder joint: Secondary | ICD-10-CM | POA: Diagnosis not present

## 2022-09-01 DIAGNOSIS — E1151 Type 2 diabetes mellitus with diabetic peripheral angiopathy without gangrene: Secondary | ICD-10-CM | POA: Diagnosis present

## 2022-09-01 DIAGNOSIS — Z8261 Family history of arthritis: Secondary | ICD-10-CM

## 2022-09-01 DIAGNOSIS — Z96619 Presence of unspecified artificial shoulder joint: Principal | ICD-10-CM

## 2022-09-01 DIAGNOSIS — D62 Acute posthemorrhagic anemia: Secondary | ICD-10-CM | POA: Diagnosis not present

## 2022-09-01 DIAGNOSIS — I480 Paroxysmal atrial fibrillation: Secondary | ICD-10-CM | POA: Diagnosis not present

## 2022-09-01 DIAGNOSIS — Z888 Allergy status to other drugs, medicaments and biological substances status: Secondary | ICD-10-CM

## 2022-09-01 DIAGNOSIS — G4700 Insomnia, unspecified: Secondary | ICD-10-CM | POA: Diagnosis present

## 2022-09-01 DIAGNOSIS — K219 Gastro-esophageal reflux disease without esophagitis: Secondary | ICD-10-CM | POA: Diagnosis present

## 2022-09-01 DIAGNOSIS — Z881 Allergy status to other antibiotic agents status: Secondary | ICD-10-CM

## 2022-09-01 DIAGNOSIS — E1165 Type 2 diabetes mellitus with hyperglycemia: Secondary | ICD-10-CM

## 2022-09-01 DIAGNOSIS — Z880 Allergy status to penicillin: Secondary | ICD-10-CM

## 2022-09-01 DIAGNOSIS — Z818 Family history of other mental and behavioral disorders: Secondary | ICD-10-CM

## 2022-09-01 DIAGNOSIS — Z8249 Family history of ischemic heart disease and other diseases of the circulatory system: Secondary | ICD-10-CM

## 2022-09-01 DIAGNOSIS — Z808 Family history of malignant neoplasm of other organs or systems: Secondary | ICD-10-CM

## 2022-09-01 DIAGNOSIS — Z87442 Personal history of urinary calculi: Secondary | ICD-10-CM

## 2022-09-01 DIAGNOSIS — R2689 Other abnormalities of gait and mobility: Secondary | ICD-10-CM | POA: Diagnosis present

## 2022-09-01 DIAGNOSIS — Z841 Family history of disorders of kidney and ureter: Secondary | ICD-10-CM

## 2022-09-01 DIAGNOSIS — I130 Hypertensive heart and chronic kidney disease with heart failure and stage 1 through stage 4 chronic kidney disease, or unspecified chronic kidney disease: Secondary | ICD-10-CM | POA: Diagnosis present

## 2022-09-01 DIAGNOSIS — Z8614 Personal history of Methicillin resistant Staphylococcus aureus infection: Secondary | ICD-10-CM

## 2022-09-01 DIAGNOSIS — Z96611 Presence of right artificial shoulder joint: Secondary | ICD-10-CM

## 2022-09-01 DIAGNOSIS — J986 Disorders of diaphragm: Secondary | ICD-10-CM | POA: Diagnosis present

## 2022-09-01 DIAGNOSIS — M25512 Pain in left shoulder: Secondary | ICD-10-CM | POA: Diagnosis present

## 2022-09-01 DIAGNOSIS — G4733 Obstructive sleep apnea (adult) (pediatric): Secondary | ICD-10-CM | POA: Diagnosis present

## 2022-09-01 DIAGNOSIS — F32A Depression, unspecified: Secondary | ICD-10-CM | POA: Diagnosis present

## 2022-09-01 DIAGNOSIS — K760 Fatty (change of) liver, not elsewhere classified: Secondary | ICD-10-CM | POA: Diagnosis present

## 2022-09-01 DIAGNOSIS — I5032 Chronic diastolic (congestive) heart failure: Secondary | ICD-10-CM | POA: Diagnosis present

## 2022-09-01 DIAGNOSIS — M19012 Primary osteoarthritis, left shoulder: Principal | ICD-10-CM | POA: Diagnosis present

## 2022-09-01 DIAGNOSIS — Z96652 Presence of left artificial knee joint: Secondary | ICD-10-CM | POA: Diagnosis present

## 2022-09-01 DIAGNOSIS — N1832 Chronic kidney disease, stage 3b: Secondary | ICD-10-CM | POA: Diagnosis present

## 2022-09-01 DIAGNOSIS — Z6841 Body Mass Index (BMI) 40.0 and over, adult: Secondary | ICD-10-CM

## 2022-09-01 DIAGNOSIS — Z79891 Long term (current) use of opiate analgesic: Secondary | ICD-10-CM

## 2022-09-01 DIAGNOSIS — Z811 Family history of alcohol abuse and dependence: Secondary | ICD-10-CM

## 2022-09-01 DIAGNOSIS — F419 Anxiety disorder, unspecified: Secondary | ICD-10-CM | POA: Diagnosis present

## 2022-09-01 HISTORY — PX: REVERSE SHOULDER ARTHROPLASTY: SHX5054

## 2022-09-01 HISTORY — DX: Opioid use, unspecified, uncomplicated: F11.90

## 2022-09-01 HISTORY — DX: Calculus of kidney: N20.0

## 2022-09-01 HISTORY — DX: Acute embolism and thrombosis of unspecified deep veins of right lower extremity: I82.401

## 2022-09-01 HISTORY — DX: Type 2 diabetes mellitus without complications: E11.9

## 2022-09-01 HISTORY — DX: Hemiplegia and hemiparesis following cerebral infarction affecting left non-dominant side: I69.354

## 2022-09-01 HISTORY — DX: Umbilical hernia without obstruction or gangrene: K42.9

## 2022-09-01 HISTORY — DX: Obstructive sleep apnea (adult) (pediatric): G47.33

## 2022-09-01 HISTORY — DX: Gout, unspecified: M10.9

## 2022-09-01 HISTORY — DX: Diaphragmatic hernia without obstruction or gangrene: K44.9

## 2022-09-01 HISTORY — DX: Long term (current) use of anticoagulants: Z79.01

## 2022-09-01 LAB — GLUCOSE, CAPILLARY
Glucose-Capillary: 128 mg/dL — ABNORMAL HIGH (ref 70–99)
Glucose-Capillary: 160 mg/dL — ABNORMAL HIGH (ref 70–99)
Glucose-Capillary: 193 mg/dL — ABNORMAL HIGH (ref 70–99)
Glucose-Capillary: 199 mg/dL — ABNORMAL HIGH (ref 70–99)

## 2022-09-01 LAB — TYPE AND SCREEN

## 2022-09-01 SURGERY — ARTHROPLASTY, SHOULDER, TOTAL, REVERSE
Anesthesia: General | Site: Shoulder | Laterality: Left

## 2022-09-01 MED ORDER — SODIUM CHLORIDE 0.9 % IR SOLN
Status: DC | PRN
  Administered 2022-09-01: 3000 mL

## 2022-09-01 MED ORDER — DOCUSATE SODIUM 100 MG PO CAPS
100.0000 mg | ORAL_CAPSULE | Freq: Two times a day (BID) | ORAL | Status: DC
  Administered 2022-09-01 – 2022-09-07 (×12): 100 mg via ORAL
  Filled 2022-09-01 (×12): qty 1

## 2022-09-01 MED ORDER — ACETAMINOPHEN 500 MG PO TABS
1000.0000 mg | ORAL_TABLET | Freq: Three times a day (TID) | ORAL | Status: AC
  Administered 2022-09-01 – 2022-09-02 (×4): 1000 mg via ORAL
  Filled 2022-09-01 (×4): qty 2

## 2022-09-01 MED ORDER — CEFAZOLIN IN SODIUM CHLORIDE 3-0.9 GM/100ML-% IV SOLN
3.0000 g | Freq: Four times a day (QID) | INTRAVENOUS | Status: AC
  Administered 2022-09-01 – 2022-09-02 (×3): 3 g via INTRAVENOUS
  Filled 2022-09-01 (×3): qty 100

## 2022-09-01 MED ORDER — PHENOL 1.4 % MT LIQD: 1.0000 | OROMUCOSAL | Status: DC | PRN

## 2022-09-01 MED ORDER — OYSTER SHELL CALCIUM/D3 500-5 MG-MCG PO TABS
1.0000 | ORAL_TABLET | Freq: Two times a day (BID) | ORAL | Status: DC
  Administered 2022-09-01 – 2022-09-07 (×12): 1 via ORAL
  Filled 2022-09-01 (×12): qty 1

## 2022-09-01 MED ORDER — LIDOCAINE HCL (PF) 2 % IJ SOLN
INTRAMUSCULAR | Status: AC
  Filled 2022-09-01: qty 5

## 2022-09-01 MED ORDER — PHENYLEPHRINE HCL-NACL 20-0.9 MG/250ML-% IV SOLN
INTRAVENOUS | Status: DC | PRN
  Administered 2022-09-01: 25 ug/min via INTRAVENOUS
  Administered 2022-09-01: 15 ug/min via INTRAVENOUS

## 2022-09-01 MED ORDER — SUCRALFATE 1 GM/10ML PO SUSP
1.0000 g | Freq: Three times a day (TID) | ORAL | Status: DC
  Administered 2022-09-01 – 2022-09-07 (×18): 1 g via ORAL
  Filled 2022-09-01 (×18): qty 10

## 2022-09-01 MED ORDER — CEPHALEXIN 250 MG PO CAPS
250.0000 mg | ORAL_CAPSULE | Freq: Every day | ORAL | Status: DC
  Administered 2022-09-02 – 2022-09-07 (×6): 250 mg via ORAL
  Filled 2022-09-01 (×6): qty 1

## 2022-09-01 MED ORDER — BUPIVACAINE LIPOSOME 1.3 % IJ SUSP
INTRAMUSCULAR | Status: DC | PRN
  Administered 2022-09-01: 20 mL

## 2022-09-01 MED ORDER — NEOMYCIN-POLYMYXIN B GU 40-200000 IR SOLN
Status: DC | PRN
  Administered 2022-09-01: 2 mL
  Administered 2022-09-01: 14 mL

## 2022-09-01 MED ORDER — EPINEPHRINE PF 1 MG/ML IJ SOLN
INTRAMUSCULAR | Status: AC
  Filled 2022-09-01: qty 1

## 2022-09-01 MED ORDER — ALBUMIN HUMAN 5 % IV SOLN: INTRAVENOUS | Status: DC | PRN

## 2022-09-01 MED ORDER — PROMETHAZINE HCL 25 MG/ML IJ SOLN
6.2500 mg | INTRAMUSCULAR | Status: DC | PRN
  Administered 2022-09-01: 6.25 mg via INTRAVENOUS

## 2022-09-01 MED ORDER — UMECLIDINIUM BROMIDE 62.5 MCG/ACT IN AEPB
1.0000 | INHALATION_SPRAY | Freq: Every day | RESPIRATORY_TRACT | Status: DC
  Administered 2022-09-02 – 2022-09-07 (×6): 1 via RESPIRATORY_TRACT
  Filled 2022-09-01: qty 7

## 2022-09-01 MED ORDER — APIXABAN 5 MG PO TABS
5.0000 mg | ORAL_TABLET | Freq: Two times a day (BID) | ORAL | Status: DC
  Administered 2022-09-02 – 2022-09-07 (×11): 5 mg via ORAL
  Filled 2022-09-01 (×11): qty 1

## 2022-09-01 MED ORDER — LOPERAMIDE-SIMETHICONE 2-125 MG PO TABS: 1.0000 | ORAL_TABLET | Freq: Every day | ORAL | Status: DC | PRN

## 2022-09-01 MED ORDER — HYDROMORPHONE HCL 1 MG/ML IJ SOLN
0.2000 mg | INTRAMUSCULAR | Status: DC | PRN
  Administered 2022-09-02: 0.4 mg via INTRAVENOUS
  Filled 2022-09-01: qty 1

## 2022-09-01 MED ORDER — INSULIN ASPART 100 UNIT/ML IJ SOLN
0.0000 [IU] | Freq: Every day | INTRAMUSCULAR | Status: DC
  Filled 2022-09-01 (×2): qty 1

## 2022-09-01 MED ORDER — FUROSEMIDE 40 MG PO TABS
40.0000 mg | ORAL_TABLET | Freq: Every day | ORAL | Status: DC
  Administered 2022-09-02 – 2022-09-07 (×6): 40 mg via ORAL
  Filled 2022-09-01 (×6): qty 1

## 2022-09-01 MED ORDER — NEOMYCIN-POLYMYXIN B GU 40-200000 IR SOLN
Status: AC
  Filled 2022-09-01: qty 20

## 2022-09-01 MED ORDER — ONDANSETRON HCL 4 MG PO TABS: 4.0000 mg | ORAL_TABLET | Freq: Three times a day (TID) | ORAL | Status: DC | PRN

## 2022-09-01 MED ORDER — OXYCODONE HCL 5 MG PO TABS: 5.0000 mg | ORAL_TABLET | Freq: Once | ORAL | Status: DC | PRN

## 2022-09-01 MED ORDER — ALBUMIN HUMAN 5 % IV SOLN
INTRAVENOUS | Status: AC
  Filled 2022-09-01: qty 250

## 2022-09-01 MED ORDER — PANTOPRAZOLE SODIUM 40 MG PO TBEC
40.0000 mg | DELAYED_RELEASE_TABLET | Freq: Every day | ORAL | Status: DC
  Administered 2022-09-02 – 2022-09-07 (×6): 40 mg via ORAL
  Filled 2022-09-01 (×6): qty 1

## 2022-09-01 MED ORDER — VITAMIN D (ERGOCALCIFEROL) 1.25 MG (50000 UNIT) PO CAPS
50000.0000 [IU] | ORAL_CAPSULE | ORAL | Status: DC
  Administered 2022-09-05: 50000 [IU] via ORAL
  Filled 2022-09-01: qty 1

## 2022-09-01 MED ORDER — ADULT MULTIVITAMIN W/MINERALS CH
1.0000 | ORAL_TABLET | Freq: Every day | ORAL | Status: DC
  Administered 2022-09-02 – 2022-09-07 (×6): 1 via ORAL
  Filled 2022-09-01 (×6): qty 1

## 2022-09-01 MED ORDER — OXYCODONE HCL 5 MG PO TABS
5.0000 mg | ORAL_TABLET | ORAL | Status: DC | PRN
  Administered 2022-09-03: 10 mg via ORAL
  Administered 2022-09-04: 5 mg via ORAL
  Administered 2022-09-05: 10 mg via ORAL
  Administered 2022-09-05 – 2022-09-06 (×3): 5 mg via ORAL
  Filled 2022-09-01 (×2): qty 1
  Filled 2022-09-01 (×2): qty 2

## 2022-09-01 MED ORDER — ROCURONIUM BROMIDE 100 MG/10ML IV SOLN
INTRAVENOUS | Status: DC | PRN
  Administered 2022-09-01: 40 mg via INTRAVENOUS
  Administered 2022-09-01: 30 mg via INTRAVENOUS

## 2022-09-01 MED ORDER — OXYCODONE HCL 5 MG/5ML PO SOLN: 5.0000 mg | Freq: Once | ORAL | Status: DC | PRN

## 2022-09-01 MED ORDER — LIDOCAINE HCL (CARDIAC) PF 100 MG/5ML IV SOSY
PREFILLED_SYRINGE | INTRAVENOUS | Status: DC | PRN
  Administered 2022-09-01: 100 mg via INTRAVENOUS

## 2022-09-01 MED ORDER — ROSUVASTATIN CALCIUM 10 MG PO TABS
20.0000 mg | ORAL_TABLET | Freq: Every day | ORAL | Status: DC
  Administered 2022-09-01 – 2022-09-06 (×6): 20 mg via ORAL
  Filled 2022-09-01 (×6): qty 2

## 2022-09-01 MED ORDER — KETAMINE HCL 50 MG/5ML IJ SOSY
PREFILLED_SYRINGE | INTRAMUSCULAR | Status: AC
  Filled 2022-09-01: qty 5

## 2022-09-01 MED ORDER — PROPOFOL 10 MG/ML IV BOLUS
INTRAVENOUS | Status: DC | PRN
  Administered 2022-09-01: 150 mg via INTRAVENOUS

## 2022-09-01 MED ORDER — FAMOTIDINE 20 MG PO TABS: 20.0000 mg | ORAL_TABLET | Freq: Once | ORAL | Status: DC

## 2022-09-01 MED ORDER — HYDROMORPHONE HCL 1 MG/ML IJ SOLN
INTRAMUSCULAR | Status: DC | PRN
  Administered 2022-09-01: 1 mg via INTRAVENOUS

## 2022-09-01 MED ORDER — PREGABALIN 50 MG PO CAPS
50.0000 mg | ORAL_CAPSULE | Freq: Three times a day (TID) | ORAL | Status: DC
  Administered 2022-09-01 – 2022-09-07 (×17): 50 mg via ORAL
  Filled 2022-09-01 (×17): qty 1

## 2022-09-01 MED ORDER — ACETAMINOPHEN 500 MG PO TABS
1000.0000 mg | ORAL_TABLET | Freq: Once | ORAL | Status: AC
  Administered 2022-09-01: 1000 mg via ORAL

## 2022-09-01 MED ORDER — ONDANSETRON HCL 4 MG PO TABS: 4.0000 mg | ORAL_TABLET | Freq: Four times a day (QID) | ORAL | Status: DC | PRN

## 2022-09-01 MED ORDER — DROPERIDOL 2.5 MG/ML IJ SOLN: 0.6250 mg | Freq: Once | INTRAMUSCULAR | Status: DC | PRN

## 2022-09-01 MED ORDER — BUPIVACAINE LIPOSOME 1.3 % IJ SUSP
INTRAMUSCULAR | Status: AC
  Filled 2022-09-01: qty 20

## 2022-09-01 MED ORDER — INSULIN ASPART 100 UNIT/ML IJ SOLN
6.0000 [IU] | Freq: Three times a day (TID) | INTRAMUSCULAR | Status: DC
  Administered 2022-09-02 – 2022-09-07 (×15): 6 [IU] via SUBCUTANEOUS
  Filled 2022-09-01 (×13): qty 1

## 2022-09-01 MED ORDER — FENTANYL CITRATE (PF) 100 MCG/2ML IJ SOLN
INTRAMUSCULAR | Status: AC
  Filled 2022-09-01: qty 2

## 2022-09-01 MED ORDER — OXYCODONE HCL 5 MG PO TABS
10.0000 mg | ORAL_TABLET | ORAL | Status: DC | PRN
  Administered 2022-09-01: 10 mg via ORAL
  Administered 2022-09-01 – 2022-09-02 (×2): 15 mg via ORAL
  Administered 2022-09-03 – 2022-09-07 (×3): 10 mg via ORAL
  Filled 2022-09-01: qty 3
  Filled 2022-09-01 (×2): qty 2
  Filled 2022-09-01 (×2): qty 3
  Filled 2022-09-01: qty 2
  Filled 2022-09-01: qty 3

## 2022-09-01 MED ORDER — FOLIC ACID 1 MG PO TABS
1.0000 mg | ORAL_TABLET | Freq: Every day | ORAL | Status: DC
  Administered 2022-09-01 – 2022-09-07 (×7): 1 mg via ORAL
  Filled 2022-09-01 (×7): qty 1

## 2022-09-01 MED ORDER — PHENYLEPHRINE HCL-NACL 20-0.9 MG/250ML-% IV SOLN
INTRAVENOUS | Status: AC
  Filled 2022-09-01: qty 250

## 2022-09-01 MED ORDER — OXYCODONE HCL 5 MG PO TABS
ORAL_TABLET | ORAL | Status: AC
  Filled 2022-09-01: qty 2

## 2022-09-01 MED ORDER — MONTELUKAST SODIUM 10 MG PO TABS
10.0000 mg | ORAL_TABLET | Freq: Every day | ORAL | Status: DC
  Administered 2022-09-01 – 2022-09-06 (×6): 10 mg via ORAL
  Filled 2022-09-01 (×6): qty 1

## 2022-09-01 MED ORDER — TRANEXAMIC ACID-NACL 1000-0.7 MG/100ML-% IV SOLN
1000.0000 mg | Freq: Once | INTRAVENOUS | Status: AC
  Administered 2022-09-01: 1000 mg via INTRAVENOUS

## 2022-09-01 MED ORDER — HYDROMORPHONE HCL 1 MG/ML IJ SOLN
INTRAMUSCULAR | Status: AC
  Filled 2022-09-01: qty 1

## 2022-09-01 MED ORDER — HYDROCODONE-ACETAMINOPHEN 5-325 MG PO TABS
1.0000 | ORAL_TABLET | Freq: Two times a day (BID) | ORAL | Status: DC | PRN
  Administered 2022-09-01 – 2022-09-06 (×4): 1 via ORAL
  Filled 2022-09-01 (×5): qty 1

## 2022-09-01 MED ORDER — METOCLOPRAMIDE HCL 5 MG/ML IJ SOLN: 5.0000 mg | Freq: Three times a day (TID) | INTRAMUSCULAR | Status: DC | PRN

## 2022-09-01 MED ORDER — SIMETHICONE 80 MG PO CHEW: 160.0000 mg | CHEWABLE_TABLET | Freq: Every day | ORAL | Status: DC | PRN

## 2022-09-01 MED ORDER — ALUM & MAG HYDROXIDE-SIMETH 200-200-20 MG/5ML PO SUSP
30.0000 mL | ORAL | Status: DC | PRN
  Administered 2022-09-02: 30 mL via ORAL
  Filled 2022-09-01: qty 30

## 2022-09-01 MED ORDER — BUPIVACAINE-EPINEPHRINE (PF) 0.25% -1:200000 IJ SOLN
INTRAMUSCULAR | Status: DC | PRN
  Administered 2022-09-01: 30 mL

## 2022-09-01 MED ORDER — CHLORHEXIDINE GLUCONATE 0.12 % MT SOLN
15.0000 mL | Freq: Once | OROMUCOSAL | Status: AC
  Administered 2022-09-01: 15 mL via OROMUCOSAL

## 2022-09-01 MED ORDER — VANCOMYCIN HCL 1000 MG IV SOLR
INTRAVENOUS | Status: DC | PRN
  Administered 2022-09-01: 1000 mg via TOPICAL

## 2022-09-01 MED ORDER — VITAMIN B-12 1000 MCG PO TABS
1000.0000 ug | ORAL_TABLET | Freq: Every day | ORAL | Status: DC
  Administered 2022-09-01 – 2022-09-07 (×7): 1000 ug via ORAL
  Filled 2022-09-01 (×7): qty 1

## 2022-09-01 MED ORDER — ALPRAZOLAM 0.25 MG PO TABS
0.2500 mg | ORAL_TABLET | Freq: Two times a day (BID) | ORAL | Status: DC | PRN
  Administered 2022-09-02: 0.25 mg via ORAL
  Filled 2022-09-01: qty 1

## 2022-09-01 MED ORDER — DULOXETINE HCL 30 MG PO CPEP
60.0000 mg | ORAL_CAPSULE | Freq: Every evening | ORAL | Status: DC
  Administered 2022-09-02 – 2022-09-06 (×5): 60 mg via ORAL
  Filled 2022-09-01 (×6): qty 2

## 2022-09-01 MED ORDER — PROMETHAZINE HCL 25 MG/ML IJ SOLN
INTRAMUSCULAR | Status: AC
  Filled 2022-09-01: qty 1

## 2022-09-01 MED ORDER — ORAL CARE MOUTH RINSE: 15.0000 mL | Freq: Once | OROMUCOSAL | Status: AC

## 2022-09-01 MED ORDER — KETOROLAC TROMETHAMINE 15 MG/ML IJ SOLN
7.5000 mg | Freq: Four times a day (QID) | INTRAMUSCULAR | Status: AC
  Administered 2022-09-01 – 2022-09-02 (×4): 7.5 mg via INTRAVENOUS
  Filled 2022-09-01 (×3): qty 1

## 2022-09-01 MED ORDER — BUPROPION HCL ER (XL) 150 MG PO TB24
150.0000 mg | ORAL_TABLET | Freq: Every day | ORAL | Status: DC
  Administered 2022-09-02 – 2022-09-07 (×6): 150 mg via ORAL
  Filled 2022-09-01 (×6): qty 1

## 2022-09-01 MED ORDER — IPRATROPIUM-ALBUTEROL 0.5-2.5 (3) MG/3ML IN SOLN: 3.0000 mL | Freq: Four times a day (QID) | RESPIRATORY_TRACT | Status: DC | PRN

## 2022-09-01 MED ORDER — SODIUM CHLORIDE 0.9 % IV SOLN: INTRAVENOUS | Status: DC

## 2022-09-01 MED ORDER — EMPAGLIFLOZIN 25 MG PO TABS
25.0000 mg | ORAL_TABLET | Freq: Every day | ORAL | Status: DC
  Administered 2022-09-02 – 2022-09-07 (×6): 25 mg via ORAL
  Filled 2022-09-01 (×6): qty 1

## 2022-09-01 MED ORDER — SENNOSIDES-DOCUSATE SODIUM 8.6-50 MG PO TABS: 1.0000 | ORAL_TABLET | Freq: Every evening | ORAL | Status: DC | PRN

## 2022-09-01 MED ORDER — GLYCOPYRROLATE 0.2 MG/ML IJ SOLN
INTRAMUSCULAR | Status: DC | PRN
  Administered 2022-09-01: .2 mg via INTRAVENOUS

## 2022-09-01 MED ORDER — FENTANYL CITRATE (PF) 100 MCG/2ML IJ SOLN: 25.0000 ug | INTRAMUSCULAR | Status: DC | PRN

## 2022-09-01 MED ORDER — SUCCINYLCHOLINE CHLORIDE 200 MG/10ML IV SOSY
PREFILLED_SYRINGE | INTRAVENOUS | Status: DC | PRN
  Administered 2022-09-01: 120 mg via INTRAVENOUS

## 2022-09-01 MED ORDER — INSULIN ASPART 100 UNIT/ML IJ SOLN
0.0000 [IU] | Freq: Three times a day (TID) | INTRAMUSCULAR | Status: DC
  Administered 2022-09-02: 4 [IU] via SUBCUTANEOUS
  Administered 2022-09-02: 3 [IU] via SUBCUTANEOUS
  Administered 2022-09-02: 7 [IU] via SUBCUTANEOUS
  Administered 2022-09-03: 4 [IU] via SUBCUTANEOUS
  Administered 2022-09-03: 7 [IU] via SUBCUTANEOUS
  Administered 2022-09-03: 3 [IU] via SUBCUTANEOUS
  Administered 2022-09-04: 4 [IU] via SUBCUTANEOUS
  Administered 2022-09-04 – 2022-09-05 (×3): 3 [IU] via SUBCUTANEOUS
  Filled 2022-09-01 (×10): qty 1

## 2022-09-01 MED ORDER — LOSARTAN POTASSIUM 50 MG PO TABS
50.0000 mg | ORAL_TABLET | Freq: Every day | ORAL | Status: DC
  Administered 2022-09-01: 50 mg via ORAL
  Filled 2022-09-01: qty 1

## 2022-09-01 MED ORDER — ALBUTEROL SULFATE (2.5 MG/3ML) 0.083% IN NEBU: 3.0000 mL | INHALATION_SOLUTION | Freq: Four times a day (QID) | RESPIRATORY_TRACT | Status: DC | PRN

## 2022-09-01 MED ORDER — NAPROXEN 500 MG PO TABS
500.0000 mg | ORAL_TABLET | Freq: Two times a day (BID) | ORAL | Status: DC
  Administered 2022-09-02 – 2022-09-06 (×9): 500 mg via ORAL
  Filled 2022-09-01 (×9): qty 1

## 2022-09-01 MED ORDER — HAIR SKIN & NAILS PO TABS: 1.0000 | ORAL_TABLET | Freq: Every day | ORAL | Status: DC

## 2022-09-01 MED ORDER — COLCHICINE 0.6 MG PO TABS: 0.6000 mg | ORAL_TABLET | Freq: Every day | ORAL | Status: DC | PRN

## 2022-09-01 MED ORDER — CHLORHEXIDINE GLUCONATE 0.12 % MT SOLN
OROMUCOSAL | Status: AC
  Filled 2022-09-01: qty 15

## 2022-09-01 MED ORDER — TRANEXAMIC ACID-NACL 1000-0.7 MG/100ML-% IV SOLN
INTRAVENOUS | Status: AC
  Filled 2022-09-01: qty 100

## 2022-09-01 MED ORDER — VANCOMYCIN HCL 1000 MG IV SOLR
INTRAVENOUS | Status: AC
  Filled 2022-09-01: qty 20

## 2022-09-01 MED ORDER — DEXAMETHASONE SODIUM PHOSPHATE 10 MG/ML IJ SOLN
INTRAMUSCULAR | Status: DC | PRN
  Administered 2022-09-01: 10 mg via INTRAVENOUS

## 2022-09-01 MED ORDER — BUPIVACAINE HCL (PF) 0.25 % IJ SOLN
INTRAMUSCULAR | Status: AC
  Filled 2022-09-01: qty 30

## 2022-09-01 MED ORDER — NALOXONE HCL 4 MG/0.1ML NA LIQD: 1.0000 | NASAL | Status: DC | PRN

## 2022-09-01 MED ORDER — MIRABEGRON ER 25 MG PO TB24
25.0000 mg | ORAL_TABLET | Freq: Every day | ORAL | Status: DC
  Administered 2022-09-02 – 2022-09-07 (×6): 25 mg via ORAL
  Filled 2022-09-01 (×6): qty 1

## 2022-09-01 MED ORDER — ONDANSETRON HCL 4 MG/2ML IJ SOLN
INTRAMUSCULAR | Status: DC | PRN
  Administered 2022-09-01 (×2): 4 mg via INTRAVENOUS

## 2022-09-01 MED ORDER — PHENYLEPHRINE 80 MCG/ML (10ML) SYRINGE FOR IV PUSH (FOR BLOOD PRESSURE SUPPORT)
PREFILLED_SYRINGE | INTRAVENOUS | Status: DC | PRN
  Administered 2022-09-01 (×2): 160 ug via INTRAVENOUS
  Administered 2022-09-01: 80 ug via INTRAVENOUS

## 2022-09-01 MED ORDER — FENTANYL CITRATE (PF) 100 MCG/2ML IJ SOLN
INTRAMUSCULAR | Status: DC | PRN
  Administered 2022-09-01 (×2): 50 ug via INTRAVENOUS

## 2022-09-01 MED ORDER — ONDANSETRON HCL 4 MG/2ML IJ SOLN: 4.0000 mg | Freq: Four times a day (QID) | INTRAMUSCULAR | Status: DC | PRN

## 2022-09-01 MED ORDER — FERROUS SULFATE 325 (65 FE) MG PO TABS
324.0000 mg | ORAL_TABLET | Freq: Every day | ORAL | Status: DC
  Administered 2022-09-02 – 2022-09-07 (×6): 324 mg via ORAL
  Filled 2022-09-01 (×6): qty 1

## 2022-09-01 MED ORDER — SUGAMMADEX SODIUM 500 MG/5ML IV SOLN
INTRAVENOUS | Status: DC | PRN
  Administered 2022-09-01: 300 mg via INTRAVENOUS

## 2022-09-01 MED ORDER — FAMOTIDINE 20 MG PO TABS
ORAL_TABLET | ORAL | Status: AC
  Filled 2022-09-01: qty 1

## 2022-09-01 MED ORDER — MENTHOL 3 MG MT LOZG: 1.0000 | LOZENGE | OROMUCOSAL | Status: DC | PRN

## 2022-09-01 MED ORDER — MAGNESIUM OXIDE -MG SUPPLEMENT 400 (240 MG) MG PO TABS
400.0000 mg | ORAL_TABLET | Freq: Every day | ORAL | Status: DC
  Administered 2022-09-01 – 2022-09-07 (×7): 400 mg via ORAL
  Filled 2022-09-01 (×7): qty 1

## 2022-09-01 MED ORDER — BISACODYL 10 MG RE SUPP: 10.0000 mg | Freq: Every day | RECTAL | Status: DC | PRN

## 2022-09-01 MED ORDER — METOCLOPRAMIDE HCL 5 MG PO TABS: 5.0000 mg | ORAL_TABLET | Freq: Three times a day (TID) | ORAL | Status: DC | PRN

## 2022-09-01 MED ORDER — TRANEXAMIC ACID-NACL 1000-0.7 MG/100ML-% IV SOLN
1000.0000 mg | INTRAVENOUS | Status: AC
  Administered 2022-09-01: 1000 mg via INTRAVENOUS

## 2022-09-01 MED ORDER — 0.9 % SODIUM CHLORIDE (POUR BTL) OPTIME
TOPICAL | Status: DC | PRN
  Administered 2022-09-01 (×2): 500 mL

## 2022-09-01 MED ORDER — FLUTICASONE FUROATE-VILANTEROL 200-25 MCG/ACT IN AEPB
1.0000 | INHALATION_SPRAY | Freq: Every day | RESPIRATORY_TRACT | Status: DC
  Administered 2022-09-02 – 2022-09-07 (×6): 1 via RESPIRATORY_TRACT
  Filled 2022-09-01: qty 28

## 2022-09-01 MED ORDER — KETAMINE HCL 10 MG/ML IJ SOLN
INTRAMUSCULAR | Status: DC | PRN
  Administered 2022-09-01: 20 mg via INTRAVENOUS

## 2022-09-01 MED ORDER — LEVOTHYROXINE SODIUM 137 MCG PO TABS
137.0000 ug | ORAL_TABLET | Freq: Every day | ORAL | Status: DC
  Administered 2022-09-02 – 2022-09-07 (×6): 137 ug via ORAL
  Filled 2022-09-01 (×6): qty 1

## 2022-09-01 MED ORDER — EPHEDRINE SULFATE-NACL 50-0.9 MG/10ML-% IV SOSY
PREFILLED_SYRINGE | INTRAVENOUS | Status: DC | PRN
  Administered 2022-09-01: 10 mg via INTRAVENOUS

## 2022-09-01 MED ORDER — ACETAMINOPHEN 10 MG/ML IV SOLN: 1000.0000 mg | Freq: Once | INTRAVENOUS | Status: DC | PRN

## 2022-09-01 MED ORDER — CEFAZOLIN IN SODIUM CHLORIDE 3-0.9 GM/100ML-% IV SOLN
3.0000 g | Freq: Once | INTRAVENOUS | Status: AC
  Administered 2022-09-01: 3 g via INTRAVENOUS
  Filled 2022-09-01: qty 100

## 2022-09-01 MED ORDER — KETOROLAC TROMETHAMINE 15 MG/ML IJ SOLN
INTRAMUSCULAR | Status: AC
  Filled 2022-09-01: qty 1

## 2022-09-01 MED ORDER — ACETAMINOPHEN 500 MG PO TABS
ORAL_TABLET | ORAL | Status: AC
  Filled 2022-09-01: qty 2

## 2022-09-01 SURGICAL SUPPLY — 87 items
ADH SKN CLS APL DERMABOND .7 (GAUZE/BANDAGES/DRESSINGS) ×1
ANCH SUT .5 CRC TPR CT 40X40 (SUTURE)
ANCH SUT 1.4 SUT TPE BLK/WHT (SUTURE)
APL PRP STRL LF DISP 70% ISPRP (MISCELLANEOUS) ×2
BASEPLATE GLENOID RSA 3X25 0D (Shoulder) IMPLANT
BIT DRILL 3.2 PERIPHERAL SCREW (BIT) IMPLANT
BLADE SAGITTAL WIDE XTHICK NO (BLADE) ×1 IMPLANT
BSPLAT GLND +3 25 (Shoulder) ×1 IMPLANT
CABLE SET DALL-MILES (Hips) IMPLANT
CHLORAPREP W/TINT 26 (MISCELLANEOUS) ×1 IMPLANT
CNTNR URN SCR LID CUP LEK RST (MISCELLANEOUS) ×1 IMPLANT
CONT SPEC 4OZ STRL OR WHT (MISCELLANEOUS)
COOLER POLAR GLACIER W/PUMP (MISCELLANEOUS) ×1 IMPLANT
COVER BACK TABLE REUSABLE LG (DRAPES) ×1 IMPLANT
DERMABOND ADVANCED .7 DNX12 (GAUZE/BANDAGES/DRESSINGS) ×1 IMPLANT
DRAPE 3/4 80X56 (DRAPES) ×2 IMPLANT
DRAPE INCISE IOBAN 66X45 STRL (DRAPES) ×2 IMPLANT
DRAPE U-SHAPE 47X51 STRL (DRAPES) ×1 IMPLANT
DRSG OPSITE POSTOP 3X4 (GAUZE/BANDAGES/DRESSINGS) ×1 IMPLANT
DRSG OPSITE POSTOP 4X6 (GAUZE/BANDAGES/DRESSINGS) ×1 IMPLANT
DRSG OPSITE POSTOP 4X8 (GAUZE/BANDAGES/DRESSINGS) ×1 IMPLANT
DRSG TEGADERM 4X10 (GAUZE/BANDAGES/DRESSINGS) ×3 IMPLANT
ELECT REM PT RETURN 9FT ADLT (ELECTROSURGICAL) ×1
ELECTRODE REM PT RTRN 9FT ADLT (ELECTROSURGICAL) ×1 IMPLANT
GAUZE XEROFORM 1X8 LF (GAUZE/BANDAGES/DRESSINGS) ×1 IMPLANT
GLENOSPHERE REV SHOULDER 36 (Joint) IMPLANT
GLOVE BIOGEL PI IND STRL 8 (GLOVE) ×2 IMPLANT
GLOVE PI ULTRA LF STRL 7.5 (GLOVE) ×2 IMPLANT
GLOVE SURG ORTHO 8.0 STRL STRW (GLOVE) ×2 IMPLANT
GLOVE SURG SYN 8.0 (GLOVE) ×1 IMPLANT
GLOVE SURG SYN 8.0 PF PI (GLOVE) ×1 IMPLANT
GOWN STRL REUS W/ TWL LRG LVL3 (GOWN DISPOSABLE) ×2 IMPLANT
GOWN STRL REUS W/ TWL XL LVL3 (GOWN DISPOSABLE) ×1 IMPLANT
GOWN STRL REUS W/TWL LRG LVL3 (GOWN DISPOSABLE) ×2
GOWN STRL REUS W/TWL XL LVL3 (GOWN DISPOSABLE) ×1
GUIDE GLENOID PATIENT REVERSED (MISCELLANEOUS) IMPLANT
GUIDEWIRE GLENOID 2.5X220 (WIRE) IMPLANT
HEMOVAC 400CC 10FR (MISCELLANEOUS) ×1 IMPLANT
HOOD PEEL AWAY T7 (MISCELLANEOUS) ×3 IMPLANT
INSERT HUMERAL 36X6MM 12.5DEG (Insert) IMPLANT
IV NS IRRIG 3000ML ARTHROMATIC (IV SOLUTION) IMPLANT
KIT STABILIZATION SHOULDER (MISCELLANEOUS) ×1 IMPLANT
MANIFOLD NEPTUNE II (INSTRUMENTS) ×1 IMPLANT
MASK FACE SPIDER DISP (MASK) ×1 IMPLANT
MAT ABSORB FLUID 56X50 GRAY (MISCELLANEOUS) ×2 IMPLANT
NDL REVERSE CUT 1/2 CRC (NEEDLE) ×1 IMPLANT
NDL SPNL 20GX3.5 QUINCKE YW (NEEDLE) ×1 IMPLANT
NEEDLE REVERSE CUT 1/2 CRC (NEEDLE) ×1 IMPLANT
NEEDLE SPNL 20GX3.5 QUINCKE YW (NEEDLE) ×1 IMPLANT
NS IRRIG 1000ML POUR BTL (IV SOLUTION) ×1 IMPLANT
NS IRRIG 500ML POUR BTL (IV SOLUTION) IMPLANT
PACK ARTHROSCOPY SHOULDER (MISCELLANEOUS) ×1 IMPLANT
PAD WRAPON POLAR SHDR XLG (MISCELLANEOUS) ×1 IMPLANT
PULSAVAC PLUS IRRIG FAN TIP (DISPOSABLE) ×1
SCREW 5.0X18 (Screw) IMPLANT
SCREW 5.0X38 SMALL F/PERFORM (Screw) IMPLANT
SCREW BONE THREAD 6.5X35 (Screw) IMPLANT
SCREW PERIPHERAL 5.0X34 (Screw) IMPLANT
SET DALL MILES CABLE SL 1.6 (MISCELLANEOUS) IMPLANT
SLING ULTRA II LG (MISCELLANEOUS) ×1 IMPLANT
SLING ULTRA II M (MISCELLANEOUS) ×1 IMPLANT
SPONGE T-LAP 18X18 ~~LOC~~+RFID (SPONGE) ×5 IMPLANT
STAPLER SKIN PROX 35W (STAPLE) IMPLANT
STEM HUMERAL SZ4B STD 78 PTC (Stem) IMPLANT
STRAP SAFETY 5IN WIDE (MISCELLANEOUS) ×2 IMPLANT
SUT ETHIBOND 5-0 MS/4 CCS GRN (SUTURE)
SUT FIBERWIRE #2 38 BLUE 1/2 (SUTURE) ×4
SUT MNCRL AB 4-0 PS2 18 (SUTURE) IMPLANT
SUT PROLENE 6 0 P 1 18 (SUTURE) ×1 IMPLANT
SUT TICRON 2-0 30IN 311381 (SUTURE) ×3 IMPLANT
SUT VIC AB 0 CT1 36 (SUTURE) ×1 IMPLANT
SUT VIC AB 2-0 CT2 27 (SUTURE) ×2 IMPLANT
SUT XBRAID 1.4 BLK/WHT (SUTURE) IMPLANT
SUT XBRAID 1.4 BLUE (SUTURE) IMPLANT
SUT XBRAID 1.4 WHITE/BLUE (SUTURE) IMPLANT
SUT XBRAID 2 BLACK/BLUE (SUTURE) IMPLANT
SUTURE ETHBND 5-0 MS/4 CCS GRN (SUTURE) IMPLANT
SUTURE FIBERWR #2 38 BLUE 1/2 (SUTURE) ×4 IMPLANT
SYR 20ML LL LF (SYRINGE) ×1 IMPLANT
SYR 30ML LL (SYRINGE) ×2 IMPLANT
TIP FAN IRRIG PULSAVAC PLUS (DISPOSABLE) ×1 IMPLANT
TRAP FLUID SMOKE EVACUATOR (MISCELLANEOUS) ×1 IMPLANT
TRAY FOLEY SLVR 16FR LF STAT (SET/KITS/TRAYS/PACK) IMPLANT
TRAY SHOULDER REV OFFSET 1.5 (Joint) IMPLANT
WATER STERILE IRR 1000ML POUR (IV SOLUTION) IMPLANT
WATER STERILE IRR 500ML POUR (IV SOLUTION) ×1 IMPLANT
WRAPON POLAR PAD SHDR XLG (MISCELLANEOUS) ×1

## 2022-09-01 NOTE — Inpatient Diabetes Management (Signed)
Inpatient Diabetes Program Recommendations  AACE/ADA: New Consensus Statement on Inpatient Glycemic Control   Target Ranges:  Prepandial:   less than 140 mg/dL      Peak postprandial:   less than 180 mg/dL (1-2 hours)      Critically ill patients:  140 - 180 mg/dL    Latest Reference Range & Units 09/01/22 06:25 09/01/22 12:04  Glucose-Capillary 70 - 99 mg/dL 161 (H) 096 (H)   Review of Glycemic Control  Diabetes history:  Outpatient Diabetes medications: Mounjaro 7.5 mg Qweek, Jardiance 25mg  daily Current orders for Inpatient glycemic control: Novolog 0-20 units TID with meals, Novolog 0-5 units QHS, Novolog 6 units TID with meals, Jardiance 25 mg daily  NOTE: Received consult for "Patient on insulin pump at home, assistance with postop blood glucose control." Per chart review, patient sees [name], NP for DM control and was seen on 08/13/22. Per office note on 08/13/22, patient was to "continue her Mounjaro 7.5 mg weekly x 3 more weeks then increase to 10 mg weekly. She will continue Jardiance 25 mg daily pending no worsening of candidiasis." Also noted patient uses FreeStyle Libre3 CGM.   Patient had shoulder surgery today and currently in PACU. Called patient's cell phone to inquire about DM medications and management at home.  Patient's husband answered patient's cell phone and notes he is in the waiting room and has not got to see his wife since surgery today. Patient's husband states that he has been patient's caregiver for last 5 years. He confirms that patient takes Mounjaro 7.5 mg Qweek and Jardiance 25 mg daily for DM control and she uses FreeStyle Libre3 for glucose monitoring. He states that patient has 3 more doses of Mounjaro 7.5 mg to use and then will be starting on increased dose of 10 mg Qweek.  Inquired about an insulin pump and he states patient does not use an insulin pump and does not take any insulin outpatient. He reports that patient's average glucose is 140-150's mg/dl  on Libre3. He states that patient does not have the reader device for the Libre3 with her and he thinks it is at home. Explained that while inpatient, nursing staff will be doing finger sticks for glucose monitoring so it is fine if she does not have the Libre3 reader. He notes that patient's last A1C was 7.8% and her PCP is working with her to get it down to 7% or less. Expressed appreciation for information provided.  Will follow along while inpatient and make recommendations if needed based on glycemic control.  Thanks, Orlando Penner, RN, MSN, CDCES Diabetes Coordinator Inpatient Diabetes Program [phone] (Team Pager from 8am to 5pm)

## 2022-09-01 NOTE — H&P (Signed)
Paper H&P to be scanned into permanent record. H&P reviewed. No significant changes noted.  

## 2022-09-01 NOTE — Discharge Instructions (Signed)
[name] H. Dawn Kucher, MD  [facility]  Phone: [phone]  Fax: [phone]   Discharge Instructions after Reverse Shoulder Replacement    1. Activity/Sling: You are to be non-weight bearing on operative extremity. A sling/shoulder immobilizer has been provided for you. Only remove the sling to perform elbow, wrist, and hand RoM exercises and hygiene/dressing. Active reaching and lifting are not permitted. You will be given further instructions on sling use at your first physical therapy visit and postoperative visit with Dr. [name].   2. Dressings: Dressing may be removed at 1st physical therapy visit (~3-4 days after surgery). Afterwards, you may either leave open to air (if no drainage) or cover with dry, sterile dressing. If you have steri-strips on your wound, please do not remove them. They will fall off on their own. You may shower 5 days after surgery. Please pat incision dry. Do not rub or place any shear forces across incision. If there is drainage or any opening of incision after 5 days, please notify our offices immediately.    3. Driving:  Plan on not driving for six weeks. Please note that you are advised NOT to drive while taking narcotic pain medications as you may be impaired and unsafe to drive.   4. Medications:  - You have been provided a prescription for narcotic pain medicine (usually oxycodone). After surgery, take 1-2 narcotic tablets every 4 hours if needed for severe pain. Please start this as soon as you begin to start having pain (if you received a nerve block, start taking as soon as this wears off).  - A prescription for anti-nausea medication will be provided in case the narcotic medicine causes nausea - take 1 tablet every 6 hours only if nauseated.  - Take enteric coated aspirin 325 mg once daily for 6 weeks to prevent blood clots. Do not take aspirin if you have an aspirin sensitivity/allergy or asthma or are on an anticoagulant (blood thinner) already. If so, then  your home anticoagulant will be resume and managed - do not take aspirin. -Take tylenol 1000mg (2 Extra strength or 3 regular strength tablets) every 8 hours for pain. This will reduce the amount of narcotic medication needed. May stop tylenol when you are having minimal pain. - Take a stool softener (Colace, Dulcolax or Senakot) if you are using narcotic pain medications to help with constipation that is associated with narcotic use. - DO NOT take ANY nonsteroidal anti-inflammatory pain medications: Advil, Motrin, Ibuprofen, Aleve, Naproxen, or Naprosyn.   If you are taking prescription medication for anxiety, depression, insomnia, muscle spasm, chronic pain, or for attention deficit disorder you are advised that you are at a higher risk of adverse effects with use of narcotics post-op, including narcotic addiction/dependence, depressed breathing, death. If you use non-prescribed substances: alcohol, marijuana, cocaine, heroin, methamphetamines, etc., you are at a higher risk of adverse effects with use of narcotics post-op, including narcotic addiction/dependence, depressed breathing, death. You are advised that taking > 50 morphine milligram equivalents (MME) of narcotic pain medication per day results in twice the risk of overdose or death. For your prescription provided: oxycodone 5 mg - taking more than 6 tablets per day after the first few days of surgery.   5. Physical Therapy: 1-2 times per week for ~12 weeks. Therapy typically starts on post operative Day 3 or 4. You have been provided an order for physical therapy. The therapist will provide home exercises. Please contact our offices if this appointment has not been scheduled.      6. Work: May do light duty/desk job in approximately 2 weeks when off of narcotics, pain is well-controlled, and swelling has decreased if able to function with one arm in sling. Full work may take 6 weeks if light motions and function of both arms is required.  Lifting jobs may require 12 weeks.   7. Post-Op Appointments: Your first post-op appointment will be with Dr. [name] in approximately 2 weeks time.    If you find that they have not been scheduled please call the Orthopaedic Appointment front desk at [phone].                               [name] [name], MD [facility] Phone: [phone] Fax: [phone]   REVERSE SHOULDER ARTHROPLASTY REHAB GUIDELINES   These guidelines should be tailored to individual patients based on their rehab goals, age, precautions, quality of repair, etc.  Progression should be based on patient progress and approval by the referring physician.  PHASE 1 - Day 1 through Week 2  GENERAL GUIDELINES AND PRECAUTIONS Sling wear 24/7 except during grooming and home exercises (3 to 5 times daily) Avoid shoulder extension such that the arm is posterior the frontal plane.  When patients recline, a pillow should be placed behind the upper arm and sling should be on.  They should be advised to always be able to see the elbow Avoid combined IR/ADD/EXT, such as hand behind back to prevent dislocation Avoid combined IR and ADD such as reaching across the chest to prevent dislocation No AROM No submersion in pool/water for 4 weeks No weight bearing through operative arm (as in transfers, walker use, etc.)  GOALS Maintain integrity of joint replacement; protect soft tissue healing Increase PROM for elevation to 120 and ER to 30 (will remain the goal for first 6 weeks) Optimize distal UE circulation and muscle activity (elbow, wrist and hand) Instruct in use of sling for proper fit, polar care device for ice application after HEP, signs/symptoms of infection  EXERCISES Active elbow, wrist and hand Passive forward elevation in scapular plane to 90-120 max motion; ER in scapular plane to 30 Active scapular retraction with arms resting in neutral position  CRITERIA TO PROGRESS TO  PHASE 2 Low pain (less than 3/10) with shoulder PROM Healing of incision without signs of infection Clearance by MD to advance after 2 week MD check up  PHASE 2 - 2 weeks - 6 weeks  GENERAL GUIDELINES AND PRECAUTIONS Sling may be removed while at home; worn in community without abduction pillow May use arm for light activities of daily living (such as feeding, brushing teeth, dressing.) with elbow near  the side of the body  and arm in front of the body- no active lifting of the arm May submerge in water (tub, pool, Jacuzzi, etc.) after 4 weeks Continue to avoid WBing through the operative arm Continue to avoid combined IR/EXT/ADD (hand behind the back) and IR/ADD  (reaching across chest) for dislocation precautions  GOALS  Achieve passive elevation to 120 and ER to 30  Low (less than 3/10) to no pain  Ability to fire all heads of the deltoid  EXERCISES May discontinue grip, and active elbow and wrist exercises since using the arm in ADL's  with sling removed around the home Continue passive elevation to 120 and ER to 30, both in scapular plane with arm supported on table top Add submaximal isometrics, pain free effort, for all   functional heads of deltoid (anterior, posterior, middle)  Ensure that with posterior deltoid isometric the shoulder does not move into extension and the arm remains anterior the frontal plane At 4 weeks:  begin to place arm in balanced position of 90 deg elevation in supine; when patient able to hold this position with ease, may begin reverse pendulums clockwise and counterclockwise  CRITERIA TO PROGRESS TO PHASE 3 Passive forward elevation in scapular plane to 120; passive ER in scapular plane to 30 Ability to fire isometrically all heads of the deltoid muscle without pain Ability to place and hold the arm in balanced position (90 deg elevation in supine)  PHASE 3 - 6 weeks to 3 months  GENERAL GUIDELINES AND PRECAUTIONS Discontinue use of sling Avoid  forcing end range motion in any direction to prevent dislocation  May advance use of the arm actively in ADL's without being restricted to arm by the side of the body, however, avoid heavy lifting and sports (forever!) May initiate functional IR behind the back gently NO UPPER BODY ERGOMETER   GOALS Optimize PROM for elevation and ER in scapular plane with realistic expectation that max  mobility for elevation is usually around 145-160 passively; ER 40 to 50 passively; functional IR to L1 Recover AROM to approach as close to PROM available as possible; may expect 135-150 deg active elevation; 30 deg active ER; active functional IR to L1 Establish dynamic stability of the shoulder with deltoid and periscapular muscle gradual strengthening  EXERCISES Forward elevation in scapular plane active progression: supine to incline, to vertical; short to long lever arm Balanced position long lever arm AROM Active ER/IR with arm at side Scapular retraction with light band resistance Functional IR with hand slide up back - very gentle and gradual NO UPPER BODY ERGOMETER     CRITERIA TO PROGRESS TO PHASE 4  AROM equals/approaches PROM with good mechanics for elevation   No pain  Higher level demand on shoulder than ADL functions   PHASE 4 12 months and beyond  GENERAL GUIDELINES AND PRECAUTIONS No heavy lifting and no overhead sports No heavy pushing activity Gradually increase strength of deltoid and scapular stabilizers; also the rotator cuff if present with weights not to exceed 5 lbs NO UPPER BODY ERGOMETER   GOALS  Optimize functional use of the operative UE to meet the desired demands  Gradual increase in deltoid, scapular muscle, and rotator cuff strength  Pain free functional activities   EXERCISES Add light hand weights for deltoid up to and not to exceed 3 lbs for anterior and posterior with long arm lift against gravity; elbow bent to 90 deg for abduction in scapular  plane Theraband progression for extension to hip with scapular depression/retraction Theraband progression for serratus anterior punches in supine; avoid wall, incline or prone pressups for serratus anterior End range stretching gently without forceful overpressure in all planes (elevation in scapular plane, ER in scapular plane, functional IR) with stretching done for life as part of a daily routine NO UPPER BODY ERGOMETER     CRITERIA FOR DISCHARGE FROM SKILLED PHYSICAL THERAPY  Pain free AROM for shoulder elevation (expect around 135-150)  Functional strength for all ADL's, work tasks, and hobbies approved by surgeon  Independence with home maintenance program   NOTES: 1. With proper exercise, motion, strength, and function continue to improve even after one year. 2. The complication rate after surgery is 5 - 8%. Complications include infection, fracture, heterotopic bone formation, nerve injury, instability, rotator cuff   tear, and tuberosity nonunion. Please look for clinical signs, unusual symptoms, or lack of progress with therapy and report those to Dr. Chatham Howington. Prefer more communication than less.  3. The therapy plan above only serves as a guide. Please be aware of specific individualized patient instructions as written on the prescription or through discussions with the surgeon. 4. Please call Dr. Cherryl Babin if you have any specific questions or concerns [phone]    

## 2022-09-01 NOTE — Anesthesia Procedure Notes (Addendum)
Procedure Name: General with mask airway Date/Time: 09/01/2022 7:57 AM  Performed by: Mohammed Kindle, CRNAPre-anesthesia Checklist: Patient identified, Emergency Drugs available, Suction available and Patient being monitored Patient Re-evaluated:Patient Re-evaluated prior to induction Oxygen Delivery Method: Circle system utilized Preoxygenation: Pre-oxygenation with 100% oxygen Induction Type: IV induction Laryngoscope Size: McGraph and 3 Grade View: Grade I Tube type: Oral Tube size: 7.0 mm Airway Equipment and Method: Stylet Placement Confirmation: ETT inserted through vocal cords under direct vision, positive ETCO2, CO2 detector and breath sounds checked- equal and bilateral Secured at: 21 cm Tube secured with: Tape Dental Injury: Teeth and Oropharynx as per pre-operative assessment

## 2022-09-01 NOTE — Transfer of Care (Signed)
Immediate Anesthesia Transfer of Care Note  Patient: [name]  Procedure(s) Performed: REVERSE SHOULDER ARTHROPLASTY (Left: Shoulder)  Patient Location: PACU  Anesthesia Type:General  Level of Consciousness: awake, drowsy, and patient cooperative  Airway & Oxygen Therapy: Patient Spontanous Breathing and Patient connected to face mask oxygen  Post-op Assessment: Report given to RN and Post -op Vital signs reviewed and stable  Post vital signs: Reviewed and stable  Last Vitals:  Vitals Value Taken Time  BP 136/82 09/01/22 1147  Temp    Pulse 88 09/01/22 1152  Resp 16 09/01/22 1153  SpO2 96 % 09/01/22 1152  Vitals shown include unvalidated device data.  Last Pain:  Vitals:   09/01/22 0649  TempSrc: Oral  PainSc: 8       Patients Stated Pain Goal: 0 (09/01/22 0649)  Complications: No notable events documented.

## 2022-09-01 NOTE — Progress Notes (Signed)
PHARMACIST - PHYSICIAN ORDER COMMUNICATION  CONCERNING: P&T Medication Policy on Herbal Medications  DESCRIPTION:  This patient's order for:  Hair Skin & Nails  has been noted.  This product(s) is classified as an "herbal" or natural product. Due to a lack of definitive safety studies or FDA approval, nonstandard manufacturing practices, plus the potential risk of unknown drug-drug interactions while on inpatient medications, the Pharmacy and Therapeutics Committee does not permit the use of "herbal" or natural products of this type within [facility].   ACTION TAKEN: The pharmacy department is unable to verify this order at this time.  Please reevaluate patient's clinical condition at discharge and address if the herbal or natural product(s) should be resumed at that time.  Gardner Candle, PharmD, BCPS Clinical Pharmacist 09/01/2022 4:57 PM

## 2022-09-01 NOTE — Op Note (Signed)
Operative Note    SURGERY DATE: 09/01/2022   PRE-OP DIAGNOSIS:  1. Left shoulder glenohumeral osteoarthritis   POST-OP DIAGNOSIS:  1. Left shoulder glenohumeral osteoarthritis  PROCEDURES:  1. Left reverse total shoulder arthroplasty 2. Left biceps tenodesis   SURGEON: Rosealee Albee, MD   ASSISTANT: Unknown Foley, RNFA  ANESTHESIA: Gen   ESTIMATED BLOOD LOSS: 500cc   TOTAL IV FLUIDS: See anesthesia record  IMPLANTS: Tornier: Standard 25mm +75mm lateralized baseplate with 6.5 x 35mm central screw; 5.25mm screws to baseplate x 4; 36mm standard glenosphere; Aequalis Ascend Flex size 4B short humeral stem; low eccentricity reversed tray; reversed insert +7mm   INDICATION(S): [name] is a 72 y.o. adult with over 4 years of left shoulder pain.  Conservative measures including medications, physical therapy, and cortisone injections have not provided adequate relief.  Preoperative imaging was consistent with severe degenerative changes to the glenohumeral joint consistent with osteoarthritis.  After discussion of risks, benefits, and alternatives to surgery, the patient elected to proceed with reverse shoulder arthroplasty and biceps tenodesis.   OPERATIVE FINDINGS: Severe degenerative changes to humeral head and glenoid; significant biceps tendinopathy   OPERATIVE REPORT:   I identified Leonel Ramsay in the pre-operative holding area. Informed consent was obtained and the surgical site was marked. I reviewed the risks and benefits of the proposed surgical intervention and the patient (and/or patient's guardian) wished to proceed. 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 The patient was transferred to the operative suite and general anesthesia was administered. The patient was placed in the beach chair position with the head of the bed elevated approximately 45 degrees. All down side  pressure points were appropriately padded. Pre-op exam under anesthesia confirmed severe stiffness and crepitus. Appropriate IV antibiotics were administered. Tranexamic acid was also administered. The extremity was then prepped and draped in standard fashion. A time out was performed confirming the correct extremity, correct patient, and correct procedure.   We used the standard deltopectoral incision from the coracoid to ~12cm distal. We found the cephalic vein and took it laterally.  We opened the deltopectoral interval widely and placed retractors under the CA ligament in the subacromial space and under the deltoid tendon at its insertion. We then abducted and internally rotated the arm and released the underlying bursa between these retractors, taking care not to damage the circumflex branch of the axillary nerve.   Next, we brought the arm back in adduction at slight forward flexion with external rotation. We opened the clavipectoral fascia lateral to the conjoint tendon. We gently palpated the axillary nerve and verified its position and continuity on both sides of the humerus with a Tug test. Note, this test was repeated multiple times during the procedure for nerve localization and confirmed to be intact at the end of the case. We then cauterized the anterior humeral circumflex ("Three sisters") vessels. The arm was then internally rotated, we cut the falciform ligament at approximately 1 cm of the upper portion of the pectoralis major insertion. Next we unroofed the bicipital groove. We proceeded with a soft tissue biceps tenodesis given the pathology of the tendon.  After opening the biceps tendon sheath all the way to the supraglenoid tubercle, we performed a biceps tenodesis with two #2 TiCron sutures to the upper border of the pectoralis major. The proximal portion of the tendon was excised.   Next, the subscapularis and anterior capsule were  peeled off of the lesser tuberosity. We released the  inferior capsule from the humerus all the way to the posterior band of the inferior glenohumeral ligament. When this was complete we gently dislocated the shoulder up into the wound. We removed any osteophytes and made our cut with the appropriate inclination in 20 degrees of retroversion.   We then turned our attention to the glenoid. The proximal humerus was retracted posteriorly. The anterior capsule was dissected free from the retracted subscapularis. The anterior capsule was then excised, exposing the anterior glenoid. We then grasped the labrum and removed it circumferentially. During the glenoid exposure, the axillary nerve was protected the entire time.     A patient-specific guide was used to drill the central guidepin.  Osteophytes were carefully removed from the glenoid.  Standard instrumentation was performed.  The baseplate with 6.5 mm central screw was placed.  This allowed for excellent compression of the baseplate to the glenoid with appropriate seating. Next the compression screw was drilled and placed.  The remaining locking screws were also placed. The peripheral reamer was used to ensure appropriate glenosphere seating.  Next, the glenosphere was impacted into place.  The central screw of the glenosphere was tightened.  Upon removal of the retractors, it was noted that the cephalic vein had been nicked, and so it was ligated.   We then turned our attention back to the humerus.  The humeral canal was sequentially broached to the appropriate size.  The trial poly was placed and humerus was reduced.  The shoulder was trialed and noted to have satisfactory stability, motion, and deltoid tension with these implants.  The trial implants were removed. The humeral canal was pulse lavaged. 3 drill holes were placed about the lesser tuberosity footprint and FiberWire sutures were passed through these holes for subscapularis repair. Next, the humeral stem was placed with the appropriate retroversion.   However, upon exam implantation of the stem, there was an intraoperative fracture of the posterior cortex of the humeral canal.  This extended approximately 4 cm from the humeral cut and did not propagate further.  The stem was backed out slightly.  A cerclage cable using the Dall-Miles system was placed and appropriate reduction was confirmed.  The stability of the humeral stem was then confirmed.  Humeral tray and poly insert were placed.  The humerus was reduced and motion, tension, and stability were satisfactory. A Hemovac drain was placed. The arm was placed in neutral rotation, and the subscapularis was repaired with the previously passed #2 FiberWire sutures after passing them through the subscapularis.    We again verified the tension on the axillary nerve, appropriate range of motion, stability of the implant, and security of the subscapularis repair.  The wound was thoroughly irrigated.  Vancomycin powder was placed in the wound.  We closed the deltopectoral interval with a running, 0-Vicryl suture. The skin was closed with 2-0 Vicryl and staples. Sterile dressing was applied. A PolarCare unit and sling were placed. Patient was extubated, transferred to a stretcher bed and to the post antesthesia care unit in stable condition.     POSTOPERATIVE PLAN: The patient will be admitted with plan for discharge home on POD#1. Operative arm to remain in sling at all times except RoM exercises and hygiene. Can perform pendulums, elbow/wrist/hand RoM exercises. Passive RoM allowed to 90 FF and 30 ER.  Resume home Eliquis for DVT ppx. Plan for outpatient PT starting on POD #3-4. Patient to return to clinic in ~2  weeks for post-operative appointment.

## 2022-09-02 ENCOUNTER — Encounter: Payer: Self-pay | Admitting: Orthopedic Surgery

## 2022-09-02 DIAGNOSIS — M19012 Primary osteoarthritis, left shoulder: Secondary | ICD-10-CM | POA: Diagnosis not present

## 2022-09-02 DIAGNOSIS — I130 Hypertensive heart and chronic kidney disease with heart failure and stage 1 through stage 4 chronic kidney disease, or unspecified chronic kidney disease: Secondary | ICD-10-CM | POA: Diagnosis not present

## 2022-09-02 DIAGNOSIS — Z7901 Long term (current) use of anticoagulants: Secondary | ICD-10-CM | POA: Diagnosis not present

## 2022-09-02 DIAGNOSIS — D509 Iron deficiency anemia, unspecified: Secondary | ICD-10-CM | POA: Diagnosis not present

## 2022-09-02 DIAGNOSIS — D62 Acute posthemorrhagic anemia: Secondary | ICD-10-CM | POA: Diagnosis not present

## 2022-09-02 DIAGNOSIS — Z96611 Presence of right artificial shoulder joint: Secondary | ICD-10-CM

## 2022-09-02 DIAGNOSIS — Z6841 Body Mass Index (BMI) 40.0 and over, adult: Secondary | ICD-10-CM | POA: Diagnosis not present

## 2022-09-02 DIAGNOSIS — I272 Pulmonary hypertension, unspecified: Secondary | ICD-10-CM | POA: Diagnosis not present

## 2022-09-02 DIAGNOSIS — E1122 Type 2 diabetes mellitus with diabetic chronic kidney disease: Secondary | ICD-10-CM | POA: Diagnosis not present

## 2022-09-02 DIAGNOSIS — I69354 Hemiplegia and hemiparesis following cerebral infarction affecting left non-dominant side: Secondary | ICD-10-CM | POA: Diagnosis not present

## 2022-09-02 DIAGNOSIS — G4733 Obstructive sleep apnea (adult) (pediatric): Secondary | ICD-10-CM | POA: Diagnosis present

## 2022-09-02 DIAGNOSIS — J45909 Unspecified asthma, uncomplicated: Secondary | ICD-10-CM | POA: Diagnosis present

## 2022-09-02 DIAGNOSIS — E1151 Type 2 diabetes mellitus with diabetic peripheral angiopathy without gangrene: Secondary | ICD-10-CM | POA: Diagnosis present

## 2022-09-02 DIAGNOSIS — N1832 Chronic kidney disease, stage 3b: Secondary | ICD-10-CM | POA: Diagnosis not present

## 2022-09-02 DIAGNOSIS — K219 Gastro-esophageal reflux disease without esophagitis: Secondary | ICD-10-CM | POA: Diagnosis present

## 2022-09-02 DIAGNOSIS — K760 Fatty (change of) liver, not elsewhere classified: Secondary | ICD-10-CM | POA: Diagnosis present

## 2022-09-02 DIAGNOSIS — F32A Depression, unspecified: Secondary | ICD-10-CM | POA: Diagnosis present

## 2022-09-02 DIAGNOSIS — I081 Rheumatic disorders of both mitral and tricuspid valves: Secondary | ICD-10-CM | POA: Diagnosis present

## 2022-09-02 DIAGNOSIS — I5032 Chronic diastolic (congestive) heart failure: Secondary | ICD-10-CM | POA: Diagnosis not present

## 2022-09-02 DIAGNOSIS — E039 Hypothyroidism, unspecified: Secondary | ICD-10-CM | POA: Diagnosis present

## 2022-09-02 DIAGNOSIS — E669 Obesity, unspecified: Secondary | ICD-10-CM | POA: Diagnosis present

## 2022-09-02 DIAGNOSIS — I480 Paroxysmal atrial fibrillation: Secondary | ICD-10-CM | POA: Diagnosis present

## 2022-09-02 DIAGNOSIS — R2689 Other abnormalities of gait and mobility: Secondary | ICD-10-CM | POA: Diagnosis present

## 2022-09-02 DIAGNOSIS — M25512 Pain in left shoulder: Secondary | ICD-10-CM | POA: Diagnosis present

## 2022-09-02 DIAGNOSIS — E785 Hyperlipidemia, unspecified: Secondary | ICD-10-CM | POA: Diagnosis present

## 2022-09-02 LAB — BASIC METABOLIC PANEL
Anion gap: 10 (ref 5–15)
BUN: 28 mg/dL — ABNORMAL HIGH (ref 8–23)
CO2: 22 mmol/L (ref 22–32)
Calcium: 7.3 mg/dL — ABNORMAL LOW (ref 8.9–10.3)
Chloride: 105 mmol/L (ref 98–111)
Creatinine, Ser: 1.43 mg/dL — ABNORMAL HIGH (ref 0.44–1.00)
GFR, Estimated: 39 mL/min — ABNORMAL LOW (ref >60)
Glucose, Bld: 203 mg/dL — ABNORMAL HIGH (ref 70–99)
Potassium: 4.3 mmol/L (ref 3.5–5.1)
Sodium: 137 mmol/L (ref 135–145)

## 2022-09-02 LAB — CBC
HCT: 31.9 % — ABNORMAL LOW (ref 36.0–46.0)
Hemoglobin: 9.8 g/dL — ABNORMAL LOW (ref 12.0–15.0)
MCH: 29 pg (ref 26.0–34.0)
MCHC: 30.7 g/dL (ref 30.0–36.0)
MCV: 94.4 fL (ref 80.0–100.0)
Platelets: 175 10*3/uL (ref 150–400)
RBC: 3.38 MIL/uL — ABNORMAL LOW (ref 3.87–5.11)
RDW: 13.8 % (ref 11.5–15.5)
WBC: 7.2 10*3/uL (ref 4.0–10.5)
nRBC: 0 % (ref 0.0–0.2)

## 2022-09-02 LAB — GLUCOSE, CAPILLARY
Glucose-Capillary: 194 mg/dL — ABNORMAL HIGH (ref 70–99)
Glucose-Capillary: 225 mg/dL — ABNORMAL HIGH (ref 70–99)
Glucose-Capillary: 140 mg/dL — ABNORMAL HIGH (ref 70–99)
Glucose-Capillary: 189 mg/dL — ABNORMAL HIGH (ref 70–99)

## 2022-09-02 MED ORDER — ACETAMINOPHEN 500 MG PO TABS: 1000.0000 mg | ORAL_TABLET | Freq: Three times a day (TID) | ORAL | 0 refills | Status: AC

## 2022-09-02 MED ORDER — FLUCONAZOLE 150 MG PO TABS
ORAL_TABLET | ORAL | 1 refills | Status: DC
Start: 2022-09-02 — End: 2023-07-14

## 2022-09-02 MED ORDER — OXYCODONE HCL 5 MG PO TABS: 5.0000 mg | ORAL_TABLET | ORAL | 0 refills | Status: DC | PRN

## 2022-09-02 MED ORDER — FLUCONAZOLE 50 MG PO TABS
150.0000 mg | ORAL_TABLET | Freq: Once | ORAL | Status: AC
  Administered 2022-09-02: 150 mg via ORAL
  Filled 2022-09-02: qty 1

## 2022-09-02 MED ORDER — LOSARTAN POTASSIUM 50 MG PO TABS
50.0000 mg | ORAL_TABLET | Freq: Every day | ORAL | Status: DC
  Administered 2022-09-03: 50 mg via ORAL
  Filled 2022-09-02: qty 1

## 2022-09-02 MED ORDER — SENNOSIDES-DOCUSATE SODIUM 8.6-50 MG PO TABS: 1.0000 | ORAL_TABLET | Freq: Every evening | ORAL | 0 refills | Status: AC | PRN

## 2022-09-02 NOTE — Progress Notes (Addendum)
Subjective: 1 Day Post-Op Procedure(s) (LRB): REVERSE SHOULDER ARTHROPLASTY (Left) Patient reports pain as mild and moderate.   Patient is well, and has had no acute complaints or problems Denies any CP, SOB, ABD pain. We will continue therapy today.  Plan is to go Home after hospital stay pending safe completion of PT goals.  Objective: Vital signs in last 24 hours: Temp:  [97.6 F (36.4 C)-98.2 F (36.8 C)] 97.6 F (36.4 C) (06/11 2054) Pulse Rate:  [73-99] 73 (06/12 0730) Resp:  [13-23] 15 (06/12 0730) BP: (87-140)/(53-74) 94/53 (06/12 0730) SpO2:  [94 %-100 %] 98 % (06/12 0730)  Intake/Output from previous day: 06/11 0701 - 06/12 0700 In: 2340.5 [P.O.:240; I.V.:1200.5; IV Piggyback:900] Out: 850 [Urine:400; Drains:50; Blood:400] Intake/Output this shift: No intake/output data recorded.  Recent Labs    09/02/22 0427  HGB 9.8*   Recent Labs    09/02/22 0427  WBC 7.2  RBC 3.38*  HCT 31.9*  PLT 175   Recent Labs    09/02/22 0427  NA 137  K 4.3  CL 105  CO2 22  BUN 28*  CREATININE 1.43*  GLUCOSE 203*  CALCIUM 7.3*   No results for input(s): "LABPT", "INR" in the last 72 hours.  EXAM General - Patient is Alert, Appropriate, and Oriented Left Upper Extremity - Neurovascular intact Sensation intact distally Intact pulses distally No cellulitis present Compartment soft Full composite fist Dressing - moderate drainage, hemovac removed, new dressing applied. No active drainage Motor Function - intact, moving foot and toes well on exam.   Past Medical History:  Diagnosis Date   Anemia    Anxiety    a.) on BZO (alprazolam) PRN   Arthritis    Asthma    Atypical chest pain    a. 03/2018 MV: No ischemia/infarct. EF >65%.   Carotid arterial disease (HCC)    a. 03/2020 U/S: RICA <50, LICA nl.   Chronic heart failure with preserved ejection fraction (HFpEF) (HCC)    a. 08/2013 Echo: EF 60-65%; b. 08/2019 Echo: EF 60-65%; c. 03/2020 Echo: EF 60-65%; d.  05/2021 Echo: EF 55-60%, no rmwa, nl RV fxn, RVSP 54.55mmHg. Mildly dil LA, mild MR, mod TR.   Chronic, continuous use of opioids    a.) has naloxone Rx available   CKD (chronic kidney disease), stage III (HCC)    Complication of anesthesia    a.) RIGHT hemidiaphragm paralysis s/p interscalene block for shoulder arthroplasty in 2017 --> caused "permanent lung damage" per patient --> REFUSES further neuraxial anesthesia   Cystocele    Deep vein thrombosis (DVT) of right lower extremity (HCC)    Depression    Diaphragm paralysis 2018   a.) RIGHT hemidiaphragm paralysis s/p interscalene block for shoulder arthroplasty in 2017; refuses additional regional/neuraxial anesthesia   DM (diabetes mellitus) type II, controlled, with peripheral vascular disorder (HCC) 08/31/2014   Dyspnea    11/08/2018 - "more now due to lack of exercise"   Edema    Fatty liver    GERD (gastroesophageal reflux disease)    Gout    Heart murmur    Hemiparesis affecting left side as late effect of cerebrovascular accident (HCC)    Hiatal hernia    a.) s/p repair   History of IBS    History of methicillin resistant staphylococcus aureus (MRSA) 2008   History of multiple strokes    Hyperlipidemia    Hypertension    Hypothyroidism    Insomnia    Long term current use  of anticoagulant    a.) apixaban   Mitral regurgitation    a. 05/2021 Echo: Mild MR.   Nephrolithiasis    Neuropathy involving both lower extremities    Non-healing ulcer of left foot (HCC)    Obesity, Class II, BMI 35-39.9, with comorbidity    OSA on CPAP    PAF (paroxysmal atrial fibrillation) (HCC)    a.) 07/2013 Event monitor: Freq PVCs and 3 short runs of Afib; b.) CHA2DS2VASc = 8 (age, sex, CHF, HTN, CVA/DVT x2, vascular disease history, T2DM);  b.) rate/rhythm maintained without pharmacological intervention; chronically anticoagulated with apixaban   PAH (pulmonary artery hypertension) (HCC)    a. 05/2021 Echo: RVSP 54.81mmHg.   Peripheral  vascular disease (HCC)    a. 03/2018 LE Angio: Nl renal arteries. Nl aorta & iliacs. Nl LLE arterial flow; b. 06/2020 ABI's: Nl bilat.   T2DM (type 2 diabetes mellitus) (HCC)    Tricuspid regurgitation    a. 05/2021 Echo: Mod TR (in setting of RVSP 54.46mmHg).   Umbilical hernia    a.) s/p repair   Urinary incontinence    Venous stasis dermatitis of both lower extremities     Assessment/Plan:   1 Day Post-Op Procedure(s) (LRB): REVERSE SHOULDER ARTHROPLASTY (Left) Principal Problem:   S/p reverse total shoulder arthroplasty  Estimated body mass index is 45.83 kg/m as calculated from the following:   Height as of this encounter: 5\' 4"  (1.626 m).   Weight as of this encounter: 121.1 kg. Advance diet Up with therapy  Pain controlled  VSS, BP soft. Continue with gentle IV fluid hydration this am. Patient asymptomatic. Hold losartan this am. Will monitor BP with PT  Acute post op blood loss anemia with underlying Fe deficiency anemia- Hgb 9.8, Continue with oral Fe supplement. Hgb stable  CM to assist with discharge to home with HHPT today pending safe completion of PT goals   DVT Prophylaxis - TED hose, SCDs, Eliquis   T. Cranston Neighbor, PA-C [facility] 09/02/2022, 8:11 AM

## 2022-09-02 NOTE — Discharge Summary (Signed)
Physician Discharge Summary  Patient ID: [name] MRN: [id number] DOB/AGE: [birth year] 73 y.o.  Admit date: 09/01/2022 Discharge date: 09/07/2022  Admission Diagnoses:  S/p reverse total shoulder arthroplasty [Z96.619] S/P reverse total shoulder arthroplasty, right [Z96.611]   Discharge Diagnoses: Patient Active Problem List   Diagnosis Date Noted   S/P reverse total shoulder arthroplasty, right 09/02/2022   S/p reverse total shoulder arthroplasty 09/01/2022   Cutaneous candidiasis 08/13/2022   Yeast vaginitis 07/16/2022   Type 2 diabetes mellitus with hyperglycemia, without long-term current use of insulin (HCC) 06/16/2022   Bronchitis 03/26/2022   BMI 40.0-44.9, adult (HCC) 12/01/2021   Stage 3b chronic kidney disease (HCC) 12/01/2021   Dysuria 11/13/2021   Abnormal MRI, shoulder 10/29/2021   SOB (shortness of breath) 06/06/2021   Iron deficiency anemia 03/27/2021   Recurrent strokes (HCC) 02/26/2021   Chronic shoulder pain (Bilateral) 02/10/2021   Impaired range of motion of shoulder (Left) 02/10/2021   Hyperlipidemia LDL goal <70 01/28/2021   Allergic rhinitis 08/22/2020   Uncomplicated opioid dependence (HCC) 07/15/2020   Cognitive deficit, post-stroke 06/22/2020   Peripheral neurogenic pain 06/20/2020   Chronic peripheral neuropathic pain 06/20/2020   Chronic use of opiate for therapeutic purpose 06/20/2020   History of cerebrovascular accident (CVA) due to embolism 06/19/2020   History of stroke with current residual effects 06/05/2020   ANA positive 04/18/2020   Nonsustained ventricular tachycardia (HCC) 04/09/2020   B12 deficiency 03/11/2020   Ulcer of left heel and midfoot (HCC) 03/11/2020   PAD (peripheral artery disease) (HCC) 03/11/2020   Rotator cuff disorder, left 03/01/2020   Cerebrovascular accident (CVA) (HCC) 03/01/2020   Primary osteoarthritis of left shoulder 02/12/2020   Folate deficiency 02/08/2020   Vitamin D deficiency 02/08/2020    Malnutrition of moderate degree 02/06/2020   Left hemiparesis (HCC) 12/28/2019   Chronic bacteriuria 11/17/2019   Hyperkalemia 09/14/2019   Bacteremia due to Gram-positive bacteria 09/08/2019   Pressure injury of skin 09/04/2019   COPD with acute exacerbation (HCC) 09/02/2019   Pharmacologic therapy 08/30/2019   Recurrent falls 07/10/2019   Left ankle pain 06/02/2019   Chronic pain of right ankle 02/14/2019   Arthritis 02/14/2019   Meralgia paresthetica (Right) 12/05/2018   Chronic anticoagulation (Eliquis) 12/05/2018   Chronic ulcer of right leg (HCC) 10/18/2018   Fibromyalgia syndrome 08/17/2018   Chronic musculoskeletal pain 08/17/2018   Tremor of right hand 08/03/2018   Overactive bladder 08/03/2018   Physical deconditioning 08/03/2018   Recurrent UTI 07/29/2018   Foot ulcer, left (HCC) 03/09/2018   Adrenal mass (HCC) 08/02/2017   Eczema 06/27/2017   Mass of both adrenal glands (HCC) 05/16/2017   Fatty liver 05/13/2017   Hip strain (Right) 04/30/2017   Ischial pain, right 04/30/2017   Hip joint pain (Left) 04/22/2017   Abnormality of gait and mobility 04/22/2017   Muscle strain of hip (Left) 04/09/2017   Pain in joint involving ankle and foot 11/18/2016   Kidney stones 11/18/2016   Spinal stenosis of thoracic region 11/18/2016   Abscess of tendon of left foot 09/21/2016   Cubital tunnel syndrome (Right) 08/07/2016   Chronic pain syndrome 02/25/2016   Long term current use of opiate analgesic 02/25/2016   Lymphedema 12/26/2015   Status post reverse total shoulder replacement 10/08/2015   Neuropathy of lateral femoral cutaneous nerve (Right) 09/23/2015   Obesity, Class III, BMI 40-49.9 (morbid obesity) (HCC) 09/23/2015   Chronic prescription benzodiazepine use 09/23/2015   Arthralgia 09/23/2015   Osteoarthritis involving multiple joints 09/23/2015  Other shoulder lesions (Right) 08/28/2015   Cervical radiculitis 08/06/2015   Opiate use (15 MME/Day) 01/16/2015    Impingement syndrome of shoulder region 01/07/2015   Mixed incontinence 11/26/2014   Atrophic vaginitis 11/26/2014   Depression with anxiety 09/21/2014   Bladder cystocele 09/21/2014   Gastro-esophageal reflux disease without esophagitis 09/21/2014   At high risk for falls 09/21/2014   Diabetic peripheral neuropathy associated with type 2 diabetes mellitus (HCC) 08/31/2014   Asthma, well controlled 08/31/2014   Hypothyroidism 08/31/2014   Peripheral vascular disease due to secondary diabetes mellitus (HCC) 12/11/2013   OSA (obstructive sleep apnea) 10/19/2013   Essential hypertension 07/27/2013   Chronic atrial fibrillation 08/10/2012    Past Medical History:  Diagnosis Date   Anemia    Anxiety    a.) on BZO (alprazolam) PRN   Arthritis    Asthma    Atypical chest pain    a. 03/2018 MV: No ischemia/infarct. EF >65%.   Carotid arterial disease (HCC)    a. 03/2020 U/S: RICA <50, LICA nl.   Chronic heart failure with preserved ejection fraction (HFpEF) (HCC)    a. 08/2013 Echo: EF 60-65%; b. 08/2019 Echo: EF 60-65%; c. 03/2020 Echo: EF 60-65%; d. 05/2021 Echo: EF 55-60%, no rmwa, nl RV fxn, RVSP 54.58mmHg. Mildly dil LA, mild MR, mod TR.   Chronic, continuous use of opioids    a.) has naloxone Rx available   CKD (chronic kidney disease), stage III (HCC)    Complication of anesthesia    a.) RIGHT hemidiaphragm paralysis s/p interscalene block for shoulder arthroplasty in 2017 --> caused "permanent lung damage" per patient --> REFUSES further neuraxial anesthesia   Cystocele    Deep vein thrombosis (DVT) of right lower extremity (HCC)    Depression    Diaphragm paralysis 2018   a.) RIGHT hemidiaphragm paralysis s/p interscalene block for shoulder arthroplasty in 2017; refuses additional regional/neuraxial anesthesia   DM (diabetes mellitus) type II, controlled, with peripheral vascular disorder (HCC) 08/31/2014   Dyspnea    11/08/2018 - "more now due to lack of exercise"   Edema     Fatty liver    GERD (gastroesophageal reflux disease)    Gout    Heart murmur    Hemiparesis affecting left side as late effect of cerebrovascular accident (HCC)    Hiatal hernia    a.) s/p repair   History of IBS    History of methicillin resistant staphylococcus aureus (MRSA) 2008   History of multiple strokes    Hyperlipidemia    Hypertension    Hypothyroidism    Insomnia    Long term current use of anticoagulant    a.) apixaban   Mitral regurgitation    a. 05/2021 Echo: Mild MR.   Nephrolithiasis    Neuropathy involving both lower extremities    Non-healing ulcer of left foot (HCC)    Obesity, Class II, BMI 35-39.9, with comorbidity    OSA on CPAP    PAF (paroxysmal atrial fibrillation) (HCC)    a.) 07/2013 Event monitor: Freq PVCs and 3 short runs of Afib; b.) CHA2DS2VASc = 8 (age, sex, CHF, HTN, CVA/DVT x2, vascular disease history, T2DM);  b.) rate/rhythm maintained without pharmacological intervention; chronically anticoagulated with apixaban   PAH (pulmonary artery hypertension) (HCC)    a. 05/2021 Echo: RVSP 54.67mmHg.   Peripheral vascular disease (HCC)    a. 03/2018 LE Angio: Nl renal arteries. Nl aorta & iliacs. Nl LLE arterial flow; b. 06/2020 ABI's: Nl bilat.  T2DM (type 2 diabetes mellitus) (HCC)    Tricuspid regurgitation    a. 05/2021 Echo: Mod TR (in setting of RVSP 54.40mmHg).   Umbilical hernia    a.) s/p repair   Urinary incontinence    Venous stasis dermatitis of both lower extremities      Transfusion: none   Consultants (if any):   Discharged Condition: Improved  Hospital Course: DASHLY MESTON is an 72 y.o. adult who was admitted 09/01/2022 with a diagnosis of S/p reverse total shoulder arthroplasty and went to the operating room on 09/01/2022 and underwent the above named procedures.  The patient was unable to transfer safely with physical therapy.  The patient worked with transfers bed to chair.  She was quite weak with ambulation.  Her safety  precautions prevented her from being discharged home with home health physical therapy or outpatient physical therapy.  She is going to rehab on postop day 5 for better rehab and lower extremity strengthening.   Surgeries: Procedure(s): REVERSE SHOULDER ARTHROPLASTY on 09/01/2022 Patient tolerated the surgery well. Taken to PACU where she was stabilized and then transferred to the orthopedic floor.  Started on Eliquis. TEDs and SCDs. No evidence of DVT. Negative Homan. Physical therapy started on day #1 for gait training and transfer. OT started day #1 for ADL and assisted devices.  Patient's IV and hemovac was d/c on day #1.    The patient worked with physical therapy, but was having difficulty with transfers.  She was unsafe initially.  She was able to make it to the chair.  On post op day #5 patient was stable and ready for discharge to home with HHPT.  Implants: Tornier: Standard 25mm +69mm lateralized baseplate with 6.5 x 35mm central screw; 5.31mm screws to baseplate x 4; 36mm standard glenosphere; Aequalis Ascend Flex size 4B short humeral stem; low eccentricity reversed tray; reversed insert +79mm   She was given perioperative antibiotics:  Anti-infectives (From admission, onward)    Start     Dose/Rate Route Frequency Ordered Stop   09/02/22 1000  cephALEXin (KEFLEX) capsule 250 mg        250 mg Oral Daily 09/01/22 1633     09/02/22 0915  fluconazole (DIFLUCAN) tablet 150 mg        150 mg Oral  Once 09/02/22 0824 09/02/22 0944   09/02/22 0000  fluconazole (DIFLUCAN) 150 MG tablet           09/02/22 0822     09/01/22 1800  ceFAZolin (ANCEF) IVPB 3g/100 mL premix        3 g 200 mL/hr over 30 Minutes Intravenous Every 6 hours 09/01/22 1633 09/02/22 1400   09/01/22 0853  vancomycin (VANCOCIN) powder  Status:  Discontinued          As needed 09/01/22 0853 09/01/22 1143   09/01/22 0745  ceFAZolin (ANCEF) IVPB 3g/100 mL premix        3 g 200 mL/hr over 30 Minutes Intravenous  Once  09/01/22 0736 09/01/22 0805     .  She was given sequential compression devices, early ambulation, and Eliquis TEDs for DVT prophylaxis.  She benefited maximally from the hospital stay and there were no complications.    Recent vital signs:  Vitals:   09/06/22 1634 09/07/22 0722  BP: 107/62 112/70  Pulse: 86 76  Resp: 16 18  Temp: 97.6 F (36.4 C) 97.6 F (36.4 C)  SpO2: (!) 80% 94%    Recent laboratory studies:  Lab Results  Component Value Date   HGB 9.8 (L) 09/02/2022   HGB 14.3 08/19/2022   HGB 13.0 07/17/2022   Lab Results  Component Value Date   WBC 7.2 09/02/2022   PLT 175 09/02/2022   Lab Results  Component Value Date   INR 1.6 (H) 04/06/2020   Lab Results  Component Value Date   NA 137 09/02/2022   K 4.3 09/02/2022   CL 105 09/02/2022   CO2 22 09/02/2022   BUN 28 (H) 09/02/2022   CREATININE 1.43 (H) 09/02/2022   GLUCOSE 203 (H) 09/02/2022    Discharge Medications:   Allergies as of 09/07/2022       Reactions   Morphine And Codeine Other (See Comments), Nausea Only   Patient becomes very confused Other reaction(s): Other (See Comments), Other (See Comments) Patient becomes very confused Patient becomes very confused   Penicillins Hives, Shortness Of Breath, Swelling, Other (See Comments)   Tolerates Keflex and Ceftriaxone   Ambien [zolpidem]    Hallucination    Aspirin Hives   Oxytetracycline Hives   Other reaction(s): Unknown        Medication List     STOP taking these medications    enoxaparin 120 MG/0.8ML injection Commonly known as: LOVENOX   HYDROcodone-acetaminophen 5-325 MG tablet Commonly known as: NORCO/VICODIN       TAKE these medications    acetaminophen 500 MG tablet Commonly known as: TYLENOL Take 2 tablets (1,000 mg total) by mouth every 8 (eight) hours. What changed:  medication strength how much to take when to take this reasons to take this   albuterol 108 (90 Base) MCG/ACT inhaler Commonly known  as: VENTOLIN HFA Inhale 2 puffs into the lungs every 6 (six) hours as needed.   ALPRAZolam 0.25 MG tablet Commonly known as: XANAX Take 1 tablet (0.25 mg total) by mouth 2 (two) times daily as needed for anxiety. See SIG change was taking 1/2 of 0.5 tablet bid prn changed to 0.25 bid prn. D/c 0.5 xanax   buPROPion 150 MG 24 hr tablet Commonly known as: WELLBUTRIN XL Take 1 tablet (150 mg total) by mouth daily.   calcium-vitamin D 500-200 MG-UNIT tablet Commonly known as: OSCAL WITH D Take 1 tablet by mouth 2 (two) times daily with a meal.   cephALEXin 250 MG capsule Commonly known as: KEFLEX Take 1 capsule (250 mg total) by mouth daily.   clobetasol cream 0.05 % Commonly known as: TEMOVATE Apply 1 application topically 2 (two) times daily as needed (skin irritation).   colchicine 0.6 MG tablet Take 0.6 mg by mouth daily. Repeat I 1 hour if needed. No more than 3 tablets in 24 hours PRN for GOUT FLARE   [employer] Triple Magnesium Complex 400 MG Caps Generic drug: Magnesium Take 1 capsule by mouth daily.   cyanocobalamin 1000 MCG tablet Commonly known as: VITAMIN B12 Take 1,000 mcg by mouth daily.   diclofenac Sodium 1 % Gel Commonly known as: VOLTAREN Apply 2 g topically 3 (three) times daily.   DULoxetine 60 MG capsule Commonly known as: CYMBALTA Take 1 capsule (60 mg total) by mouth daily. What changed: additional instructions   Eliquis 5 MG Tabs tablet Generic drug: apixaban TAKE 1 TABLET BY MOUTH TWICE DAILY   empagliflozin 25 MG Tabs tablet Commonly known as: Jardiance Take 1 tablet (25 mg total) by mouth daily before breakfast.   ergocalciferol 1.25 MG (50000 UT) capsule Commonly known as: VITAMIN D2 Take 50,000 Units by mouth once a week.  estradiol 0.1 MG/GM vaginal cream Commonly known as: ESTRACE Apply 0.5mg  (pea-sized amount)  just inside the vaginal introitus with a finger-tip on Monday, Wednesday and Friday nights,   ferrous sulfate 324 MG Tbec Take  324 mg by mouth daily with breakfast.   fluconazole 150 MG tablet Commonly known as: DIFLUCAN Take 1 tablet by mouth x 1 dose. May repeat in 72 hours if needed.   folic acid 800 MCG tablet Commonly known as: FOLVITE Take 800 mcg by mouth daily.   FreeStyle Libre 3 Sensor Misc   furosemide 40 MG tablet Commonly known as: LASIX Take 1 tablet (40 mg total) by mouth as directed. Take 1 tablet daily. May take additional 1 tablet If weight gain of greater than 2 pounds in 24 hours or 5 pounds in one week.   Gemtesa 75 MG Tabs Generic drug: Vibegron Take 1 tablet (75 mg total) by mouth daily.   Gold Bond No Mess Body Powder Aerp Apply 1 application. topically daily as needed. Gold bond talc free powder   Hair Skin & Nails Tabs Take 1 tablet by mouth daily.   ipratropium-albuterol 0.5-2.5 (3) MG/3ML Soln Commonly known as: DUONEB Take 3 mLs by nebulization every 6 (six) hours as needed.   levocetirizine 5 MG tablet Commonly known as: XYZAL TAKE ONE TABLET BY MOUTH AT BEDTIME AS NEEDED FOR ALLERGIES   levothyroxine 137 MCG tablet Commonly known as: SYNTHROID Take 1 tablet (137 mcg total) by mouth daily before breakfast. 30 minutes do not mix with vitamins or acid reflux medications D/c 150 mcg   Loperamide-Simethicone 2-125 MG Tabs Take 1 tablet by mouth daily as needed. May repeat in 1 hour   losartan 50 MG tablet Commonly known as: COZAAR TAKE 1 TABLET BY MOUTH DAILY   montelukast 10 MG tablet Commonly known as: SINGULAIR Take 1 tablet (10 mg total) by mouth at bedtime.   multivitamin with minerals Tabs tablet Take 1 tablet by mouth daily.   naloxone 4 MG/0.1ML Liqd nasal spray kit Commonly known as: NARCAN Place 1 spray into the nose as needed for up to 365 doses (for opioid-induced respiratory depresssion). In case of emergency (overdose), spray once into each nostril. If no response within 3 minutes, repeat application and call 911.   nystatin ointment Commonly  known as: MYCOSTATIN Apply 1 Application topically 2 (two) times daily.   nystatin powder Commonly known as: MYCOSTATIN/NYSTOP Apply 1 Application topically 3 (three) times daily.   ondansetron 4 MG tablet Commonly known as: Zofran Take 1 tablet (4 mg total) by mouth every 8 (eight) hours as needed for nausea or vomiting.   OneTouch Ultra test strip Generic drug: glucose blood TEST TWICE DAILY AS DIRECTED   onetouch ultrasoft lancets Use as instructed   oxyCODONE 5 MG immediate release tablet Commonly known as: Oxy IR/ROXICODONE Take 1-2 tablets (5-10 mg total) by mouth every 4 (four) hours as needed for moderate pain (pain score 4-6).   pantoprazole 40 MG tablet Commonly known as: PROTONIX Take 1 tablet (40 mg total) by mouth daily. 30 min before food   pregabalin 50 MG capsule Commonly known as: LYRICA TAKE 1 CAPSULE BY MOUTH 3 TIMES DAILY   rosuvastatin 20 MG tablet Commonly known as: CRESTOR TAKE 1 TABLET BY MOUTH DAILY What changed: when to take this   senna-docusate 8.6-50 MG tablet Commonly known as: Senokot-S Take 1 tablet by mouth at bedtime as needed for mild constipation.   sucralfate 1 GM/10ML suspension Commonly known as: CARAFATE  Take 1 g by mouth 3 (three) times daily.   tirzepatide 10 MG/0.5ML Pen Commonly known as: MOUNJARO Inject 10 mg into the skin once a week. What changed: Another medication with the same name was removed. Continue taking this medication, and follow the directions you see here.   Trelegy Ellipta 200-62.5-25 MCG/ACT Aepb Generic drug: Fluticasone-Umeclidin-Vilant Inhale 1 puff into the lungs daily.   Tub Transfer Board Misc 1 Device by Does not apply route as needed.        Diagnostic Studies: DG Shoulder Left Port  Result Date: 09/01/2022 CLINICAL DATA:  Postop shoulder replacement. EXAM: LEFT SHOULDER COMPARISON:  Preoperative CT FINDINGS: Reverse shoulder arthroplasty in expected alignment. Cerclage wire about the  humeral stem. Expected postsurgical change in the soft tissues with air and edema. Skin staples in place. IMPRESSION: Reverse shoulder arthroplasty in expected alignment. No immediate postoperative complication. Electronically Signed   By: Narda Rutherford M.D.   On: 09/01/2022 14:03    Disposition: Discharge disposition: 03-Skilled Nursing Facility          Follow-up Information     Dedra Skeens, [place]. Go on 09/16/2022.   Specialty: Orthopedic Surgery Why: Dedra Skeens, PA, Tuesday, June 26 at 2:45 p.m. Contact information: [address] [place] [address] [phone]                  Signed: Lenard Forth, Leilana Mcquire 09/07/2022, 8:53 AM

## 2022-09-02 NOTE — Anesthesia Postprocedure Evaluation (Addendum)
Anesthesia Post Note  Patient: [name]  Procedure(s) Performed: REVERSE SHOULDER ARTHROPLASTY (Left: Shoulder)  Patient location during evaluation: PACU Anesthesia Type: General Level of consciousness: awake and alert Pain management: pain level controlled Vital Signs Assessment: post-procedure vital signs reviewed and stable Respiratory status: spontaneous breathing, nonlabored ventilation, respiratory function stable and patient connected to nasal cannula oxygen Cardiovascular status: blood pressure returned to baseline and stable Postop Assessment: no apparent nausea or vomiting Anesthetic complications: no   No notable events documented.   Last Vitals:  Vitals:   09/01/22 2054 09/02/22 0730  BP: (!) 107/59 (!) 94/53  Pulse: 80 73  Resp: 20 15  Temp: 36.4 C   SpO2: 100% 98%    Last Pain:  Vitals:   09/02/22 0436  TempSrc:   PainSc: 8                  Foye Deer

## 2022-09-02 NOTE — Evaluation (Signed)
Occupational Therapy Evaluation Patient Details Name: [name] MRN: [id number] DOB: May 31, 1950 Today's Date: 09/02/2022   History of Present Illness [name] is a 71yoF who comes to [facility] on 09/01/22 for elective Left TSA. Pt is known to our services from several prior admissions. PMH: DM, AF, CHF, LEE, recurrent UTI, recurrent skin yeast ABD/breast, multiple CVA c left hemiplegia.   Clinical Impression   [name] was seen for OT evaluation this date. Prior to hospital admission, pt was limited household ambulator using RW. Pt lives with spouse who can only provide minimal physical assistance. Pt currently requires MIN A don/doff B shoes seated EOB. TOTAL A for chair>bed squat pivot t/f. SETUP self-feeding in sitting. MAX A adjust sling/polar care in sitting. Noted to have abrasion under R armpit, may be from polar care strap, RN in to assess, straps left off.Educated on [employer]. Pt would benefit from skilled OT to address noted impairments and functional limitations (see below for any additional details). Upon hospital discharge, recommend follow up therapy and +2 for all mobility.    Recommendations for follow up therapy are one component of a multi-disciplinary discharge planning process, led by the attending physician.  Recommendations may be updated based on patient status, additional functional criteria and insurance authorization.   Assistance Recommended at Discharge Frequent or constant Supervision/Assistance  Patient can return home with the following Two people to help with walking and/or transfers;Two people to help with bathing/dressing/bathroom;Help with stairs or ramp for entrance    Functional Status Assessment  Patient has had a recent decline in their functional status and demonstrates the ability to make significant improvements in function in a reasonable and predictable amount of time.  Equipment Recommendations  BSC/3in1    Recommendations for Other  Services       Precautions / Restrictions Precautions Precautions: Fall Restrictions Weight Bearing Restrictions: Yes LUE Weight Bearing: Non weight bearing      Mobility Bed Mobility Overal bed mobility: Needs Assistance Bed Mobility: Sit to Supine       Sit to supine: Min assist        Transfers Overall transfer level: Needs assistance Equipment used: Quad cane Transfers: Sit to/from Stand, Bed to chair/wheelchair/BSC Sit to Stand: Min assist, +2 physical assistance   Squat pivot transfers: Total assist       General transfer comment: clears rear however does not achieve upright posture. Unable to pivot feet      Balance Overall balance assessment: Needs assistance Sitting-balance support: No upper extremity supported, Feet supported Sitting balance-Leahy Scale: Fair     Standing balance support: Bilateral upper extremity supported Standing balance-Leahy Scale: Poor                             ADL either performed or assessed with clinical judgement   ADL Overall ADL's : Needs assistance/impaired                                       General ADL Comments: MIN A don/doff B shoes seated EOB. TOTAL A for ADL t/f. SETUP self-feeding in sitting      Pertinent Vitals/Pain Pain Assessment Pain Assessment: Faces Faces Pain Scale: Hurts little more Pain Location: grimacing with friction to B underarms Pain Descriptors / Indicators: Aching, Discomfort, Dull Pain Intervention(s): Limited activity within patient's tolerance, Repositioned, Premedicated before session  Hand Dominance Right   Extremity/Trunk Assessment Upper Extremity Assessment Upper Extremity Assessment: LUE deficits/detail LUE: Unable to fully assess due to immobilization   Lower Extremity Assessment Lower Extremity Assessment: Generalized weakness       Communication Communication Communication: No difficulties   Cognition Arousal/Alertness:  Awake/alert Behavior During Therapy: Anxious, WFL for tasks assessed/performed Overall Cognitive Status: History of cognitive impairments - at baseline                                 General Comments: tangential, difficulty following cues however near baseline      Home Living Family/patient expects to be discharged to:: Private residence Living Arrangements: Spouse/significant other Available Help at Discharge: Family Type of Home: House Home Access: Ramped entrance     Home Layout: One level     Bathroom Shower/Tub: Chief Strategy Officer: Handicapped height     Home Equipment: Agricultural consultant (2 wheels);Cane - quad;Wheelchair - manual;Tub bench;Hospital bed          Prior Functioning/Environment Prior Level of Function : Needs assist             Mobility Comments: Per PT notes March 2023- pt was using RW for household distances only, chair follow performed by husband due to high falls risk. 09/02/22 pt reports some attempted use of QC when her husband is not looking, but that he doesn't want her using it.          OT Problem List: Decreased strength;Decreased range of motion;Decreased activity tolerance;Impaired balance (sitting and/or standing);Decreased safety awareness      OT Treatment/Interventions: Self-care/ADL training;Therapeutic exercise;Energy conservation;DME and/or AE instruction;Therapeutic activities;Patient/family education;Balance training    OT Goals(Current goals can be found in the care plan section) Acute Rehab OT Goals Patient Stated Goal: to go home OT Goal Formulation: With patient/family Time For Goal Achievement: 09/16/22 Potential to Achieve Goals: Fair ADL Goals Pt Will Perform Grooming: with modified independence;sitting Pt Will Perform Lower Body Dressing: with min assist;sitting/lateral leans Pt Will Transfer to Toilet: with min assist;stand pivot transfer;bedside commode  OT Frequency: Min 2X/week     Co-evaluation              AM-PAC OT "6 Clicks" Daily Activity     Outcome Measure Help from another person eating meals?: A Little Help from another person taking care of personal grooming?: A Little Help from another person toileting, which includes using toliet, bedpan, or urinal?: A Lot Help from another person bathing (including washing, rinsing, drying)?: A Lot Help from another person to put on and taking off regular upper body clothing?: A Little Help from another person to put on and taking off regular lower body clothing?: A Lot 6 Click Score: 15   End of Session    Activity Tolerance: Patient tolerated treatment well Patient left: in bed;with call bell/phone within reach;with nursing/sitter in room;with family/visitor present  OT Visit Diagnosis: Other abnormalities of gait and mobility (R26.89);Muscle weakness (generalized) (M62.81)                Time: 1610-9604 OT Time Calculation (min): 33 min Charges:  OT General Charges $OT Visit: 1 Visit OT Evaluation $OT Eval Moderate Complexity: 1 Mod OT Treatments $Self Care/Home Management : 8-22 mins  Kathie Dike, M.S. OTR/L  09/02/22, 1:19 PM  ascom [phone]

## 2022-09-02 NOTE — NC FL2 (Signed)
[place] MEDICAID FL2 LEVEL OF CARE FORM     IDENTIFICATION  Patient Name: [name] Birthdate: [birth year] Sex: adult Admission Date (Current Location): 09/01/2022  [facility] and [place] Number:  Chiropodist and Address:  [facility], [address] [place], [place] [address]      Provider Number: [id number]  Attending Physician Name and Address:  Signa Kell, MD  Relative Name and Phone Number:  Tinnie Gens [phone]    Current Level of Care: Hospital Recommended Level of Care: Skilled Nursing Facility Prior Approval Number:    Date Approved/Denied:   PASRR Number: [id number]  Discharge Plan: SNF    Current Diagnoses: Patient Active Problem List   Diagnosis Date Noted   S/P reverse total shoulder arthroplasty, right 09/02/2022   S/p reverse total shoulder arthroplasty 09/01/2022   Cutaneous candidiasis 08/13/2022   Yeast vaginitis 07/16/2022   Type 2 diabetes mellitus with hyperglycemia, without long-term current use of insulin (HCC) 06/16/2022   Bronchitis 03/26/2022   BMI 40.0-44.9, adult (HCC) 12/01/2021   Stage 3b chronic kidney disease (HCC) 12/01/2021   Dysuria 11/13/2021   Abnormal MRI, shoulder 10/29/2021   SOB (shortness of breath) 06/06/2021   Iron deficiency anemia 03/27/2021   Recurrent strokes (HCC) 02/26/2021   Chronic shoulder pain (Bilateral) 02/10/2021   Impaired range of motion of shoulder (Left) 02/10/2021   Hyperlipidemia LDL goal <70 01/28/2021   Allergic rhinitis 08/22/2020   Uncomplicated opioid dependence (HCC) 07/15/2020   Cognitive deficit, post-stroke 06/22/2020   Peripheral neurogenic pain 06/20/2020   Chronic peripheral neuropathic pain 06/20/2020   Chronic use of opiate for therapeutic purpose 06/20/2020   History of cerebrovascular accident (CVA) due to embolism 06/19/2020   History of stroke with current residual effects 06/05/2020   ANA positive 04/18/2020   Nonsustained ventricular  tachycardia (HCC) 04/09/2020   B12 deficiency 03/11/2020   Ulcer of left heel and midfoot (HCC) 03/11/2020   PAD (peripheral artery disease) (HCC) 03/11/2020   Rotator cuff disorder, left 03/01/2020   Cerebrovascular accident (CVA) (HCC) 03/01/2020   Primary osteoarthritis of left shoulder 02/12/2020   Folate deficiency 02/08/2020   Vitamin D deficiency 02/08/2020   Malnutrition of moderate degree 02/06/2020   Left hemiparesis (HCC) 12/28/2019   Chronic bacteriuria 11/17/2019   Hyperkalemia 09/14/2019   Bacteremia due to Gram-positive bacteria 09/08/2019   Pressure injury of skin 09/04/2019   COPD with acute exacerbation (HCC) 09/02/2019   Pharmacologic therapy 08/30/2019   Recurrent falls 07/10/2019   Left ankle pain 06/02/2019   Chronic pain of right ankle 02/14/2019   Arthritis 02/14/2019   Meralgia paresthetica (Right) 12/05/2018   Chronic anticoagulation (Eliquis) 12/05/2018   Chronic ulcer of right leg (HCC) 10/18/2018   Fibromyalgia syndrome 08/17/2018   Chronic musculoskeletal pain 08/17/2018   Tremor of right hand 08/03/2018   Overactive bladder 08/03/2018   Physical deconditioning 08/03/2018   Recurrent UTI 07/29/2018   Foot ulcer, left (HCC) 03/09/2018   Adrenal mass (HCC) 08/02/2017   Eczema 06/27/2017   Mass of both adrenal glands (HCC) 05/16/2017   Fatty liver 05/13/2017   Hip strain (Right) 04/30/2017   Ischial pain, right 04/30/2017   Hip joint pain (Left) 04/22/2017   Abnormality of gait and mobility 04/22/2017   Muscle strain of hip (Left) 04/09/2017   Pain in joint involving ankle and foot 11/18/2016   Kidney stones 11/18/2016   Spinal stenosis of thoracic region 11/18/2016   Abscess of tendon of left foot 09/21/2016   Cubital  tunnel syndrome (Right) 08/07/2016   Chronic pain syndrome 02/25/2016   Long term current use of opiate analgesic 02/25/2016   Lymphedema 12/26/2015   Status post reverse total shoulder replacement 10/08/2015   Neuropathy of  lateral femoral cutaneous nerve (Right) 09/23/2015   Obesity, Class III, BMI 40-49.9 (morbid obesity) (HCC) 09/23/2015   Chronic prescription benzodiazepine use 09/23/2015   Arthralgia 09/23/2015   Osteoarthritis involving multiple joints 09/23/2015   Other shoulder lesions (Right) 08/28/2015   Cervical radiculitis 08/06/2015   Opiate use (15 MME/Day) 01/16/2015   Impingement syndrome of shoulder region 01/07/2015   Mixed incontinence 11/26/2014   Atrophic vaginitis 11/26/2014   Depression with anxiety 09/21/2014   Bladder cystocele 09/21/2014   Gastro-esophageal reflux disease without esophagitis 09/21/2014   At high risk for falls 09/21/2014   Diabetic peripheral neuropathy associated with type 2 diabetes mellitus (HCC) 08/31/2014   Asthma, well controlled 08/31/2014   Hypothyroidism 08/31/2014   Peripheral vascular disease due to secondary diabetes mellitus (HCC) 12/11/2013   OSA (obstructive sleep apnea) 10/19/2013   Essential hypertension 07/27/2013   Chronic atrial fibrillation 08/10/2012    Orientation RESPIRATION BLADDER Height & Weight     Self, Time, Situation, Place  Normal Continent Weight: 121.1 kg Height:  5\' 4"  (162.6 cm)  BEHAVIORAL SYMPTOMS/MOOD NEUROLOGICAL BOWEL NUTRITION STATUS      Continent Diet  AMBULATORY STATUS COMMUNICATION OF NEEDS Skin   Extensive Assist Verbally Normal, Surgical wounds                       Personal Care Assistance Level of Assistance  Bathing, Feeding, Dressing Bathing Assistance: Limited assistance Feeding assistance: Limited assistance Dressing Assistance: Limited assistance     Functional Limitations Info  Sight, Hearing, Speech Sight Info: Adequate Hearing Info: Adequate Speech Info: Adequate    SPECIAL CARE FACTORS FREQUENCY  PT (By licensed PT), OT (By licensed OT)     PT Frequency: 5 times per week OT Frequency: 5 times per week            Contractures      Additional Factors Info  Code Status,  Allergies Code Status Info: full code Allergies Info: Morphine And Codeine, Penicillins, Ambien (Zolpidem), Aspirin, Oxytetracycline           Current Medications (09/02/2022):  This is the current hospital active medication list Current Facility-Administered Medications  Medication Dose Route Frequency Provider Last Rate Last Admin   0.9 %  sodium chloride infusion   Intravenous Continuous Signa Kell, MD   Stopped at 09/01/22 1624   acetaminophen (TYLENOL) tablet 1,000 mg  1,000 mg Oral Q8H Signa Kell, MD   1,000 mg at 09/02/22 0929   albuterol (PROVENTIL) (2.5 MG/3ML) 0.083% nebulizer solution 3 mL  3 mL Inhalation Q6H PRN Signa Kell, MD       ALPRAZolam Prudy Feeler) tablet 0.25 mg  0.25 mg Oral BID PRN Signa Kell, MD   0.25 mg at 09/02/22 0549   alum & mag hydroxide-simeth (MAALOX/MYLANTA) 200-200-20 MG/5ML suspension 30 mL  30 mL Oral Q4H PRN Signa Kell, MD   30 mL at 09/02/22 0055   apixaban (ELIQUIS) tablet 5 mg  5 mg Oral BID Signa Kell, MD   5 mg at 09/02/22 7846   bisacodyl (DULCOLAX) suppository 10 mg  10 mg Rectal Daily PRN Signa Kell, MD       buPROPion (WELLBUTRIN XL) 24 hr tablet 150 mg  150 mg Oral Daily Signa Kell, MD   150  mg at 09/02/22 0929   calcium-vitamin D (OSCAL WITH D) 500-5 MG-MCG per tablet 1 tablet  1 tablet Oral BID WC Signa Kell, MD   1 tablet at 09/02/22 0930   cephALEXin (KEFLEX) capsule 250 mg  250 mg Oral Daily Signa Kell, MD   250 mg at 09/02/22 0930   colchicine tablet 0.6 mg  0.6 mg Oral Daily PRN Signa Kell, MD       cyanocobalamin (VITAMIN B12) tablet 1,000 mcg  1,000 mcg Oral Daily Signa Kell, MD   1,000 mcg at 09/02/22 0930   docusate sodium (COLACE) capsule 100 mg  100 mg Oral BID Signa Kell, MD   100 mg at 09/02/22 0928   DULoxetine (CYMBALTA) DR capsule 60 mg  60 mg Oral QPM Signa Kell, MD       empagliflozin (JARDIANCE) tablet 25 mg  25 mg Oral QAC breakfast Signa Kell, MD   25 mg at 09/02/22 0931   famotidine (PEPCID)  tablet 20 mg  20 mg Oral Once Verlee Monte, NP       ferrous sulfate tablet 324 mg  324 mg Oral Q breakfast Signa Kell, MD   324 mg at 09/02/22 0930   fluticasone furoate-vilanterol (BREO ELLIPTA) 200-25 MCG/ACT 1 puff  1 puff Inhalation Daily Signa Kell, MD   1 puff at 09/02/22 0933   And   umeclidinium bromide (INCRUSE ELLIPTA) 62.5 MCG/ACT 1 puff  1 puff Inhalation Daily Signa Kell, MD   1 puff at 09/02/22 0932   folic acid (FOLVITE) tablet 1 mg  1 mg Oral Daily Signa Kell, MD   1 mg at 09/02/22 0930   furosemide (LASIX) tablet 40 mg  40 mg Oral Daily Signa Kell, MD   40 mg at 09/02/22 0928   HYDROcodone-acetaminophen (NORCO/VICODIN) 5-325 MG per tablet 1 tablet  1 tablet Oral BID PRN Signa Kell, MD   1 tablet at 09/01/22 1817   HYDROmorphone (DILAUDID) injection 0.2-0.4 mg  0.2-0.4 mg Intravenous Q2H PRN Signa Kell, MD   0.4 mg at 09/02/22 0945   insulin aspart (novoLOG) injection 0-20 Units  0-20 Units Subcutaneous TID WC Signa Kell, MD   7 Units at 09/02/22 1211   insulin aspart (novoLOG) injection 0-5 Units  0-5 Units Subcutaneous QHS Signa Kell, MD       insulin aspart (novoLOG) injection 6 Units  6 Units Subcutaneous TID WC Signa Kell, MD   6 Units at 09/02/22 1211   ipratropium-albuterol (DUONEB) 0.5-2.5 (3) MG/3ML nebulizer solution 3 mL  3 mL Nebulization Q6H PRN Signa Kell, MD       levothyroxine (SYNTHROID) tablet 137 mcg  137 mcg Oral QAC breakfast Signa Kell, MD   137 mcg at 09/02/22 0549   [START ON 09/03/2022] losartan (COZAAR) tablet 50 mg  50 mg Oral Daily Evon Slack, PA-C       magnesium oxide (MAG-OX) tablet 400 mg  400 mg Oral Daily Signa Kell, MD   400 mg at 09/02/22 0930   menthol-cetylpyridinium (CEPACOL) lozenge 3 mg  1 lozenge Oral PRN Signa Kell, MD       Or   phenol (CHLORASEPTIC) mouth spray 1 spray  1 spray Mouth/Throat PRN Signa Kell, MD       metoCLOPramide (REGLAN) tablet 5-10 mg  5-10 mg Oral Q8H PRN Signa Kell, MD       Or    metoCLOPramide (REGLAN) injection 5-10 mg  5-10 mg Intravenous Q8H PRN Signa Kell, MD  mirabegron ER (MYRBETRIQ) tablet 25 mg  25 mg Oral Daily Signa Kell, MD   25 mg at 09/02/22 0930   montelukast (SINGULAIR) tablet 10 mg  10 mg Oral QHS Signa Kell, MD   10 mg at 09/01/22 2131   multivitamin with minerals tablet 1 tablet  1 tablet Oral Daily Signa Kell, MD   1 tablet at 09/02/22 0930   naloxone (NARCAN) nasal spray 4 mg/0.1 mL  1 spray Nasal PRN Signa Kell, MD       naproxen (NAPROSYN) tablet 500 mg  500 mg Oral BID WC Signa Kell, MD   500 mg at 09/02/22 0930   ondansetron (ZOFRAN) tablet 4 mg  4 mg Oral Q6H PRN Signa Kell, MD       Or   ondansetron [facility]) injection 4 mg  4 mg Intravenous Q6H PRN Signa Kell, MD       ondansetron [facility]) tablet 4 mg  4 mg Oral Q8H PRN Signa Kell, MD       oxyCODONE (Oxy IR/ROXICODONE) immediate release tablet 10-15 mg  10-15 mg Oral Q3H PRN Signa Kell, MD   15 mg at 09/02/22 0436   oxyCODONE (Oxy IR/ROXICODONE) immediate release tablet 5-10 mg  5-10 mg Oral Q4H PRN Signa Kell, MD       pantoprazole (PROTONIX) EC tablet 40 mg  40 mg Oral Daily Signa Kell, MD   40 mg at 09/02/22 0928   pregabalin (LYRICA) capsule 50 mg  50 mg Oral TID Signa Kell, MD   50 mg at 09/02/22 0929   rosuvastatin (CRESTOR) tablet 20 mg  20 mg Oral QHS Signa Kell, MD   20 mg at 09/01/22 2131   senna-docusate (Senokot-S) tablet 1 tablet  1 tablet Oral QHS PRN Signa Kell, MD       simethicone [facility]) chewable tablet 160 mg  160 mg Oral Daily PRN Hallaji, Sheema M, RPH       sucralfate (CARAFATE) 1 GM/10ML suspension 1 g  1 g Oral TID Signa Kell, MD   1 g at 09/02/22 0927   [START ON 09/05/2022] Vitamin D (Ergocalciferol) (DRISDOL) 1.25 MG (50000 UNIT) capsule 50,000 Units  50,000 Units Oral Weekly Signa Kell, MD         Discharge Medications: Please see discharge summary for a list of discharge medications.  Relevant Imaging  Results:  Relevant Lab Results:   Additional Information SS# [id number]  Marlowe Sax, RN

## 2022-09-02 NOTE — TOC Progression Note (Signed)
Transition of Care [facility]) - Progression Note    Patient Details  Name: [name] MRN: [id number] Date of Birth: 06-Jan-1951  [facility] CM/SW Contact  Marlowe Sax, RN Phone Number: 09/02/2022, 11:52 AM  Clinical Narrative:     Spoke with the patient She lives at home with her husband He helps her at home, she reports he is not in good health   She is hoping to be able to go home however she is agreeable to a bedsearch in case she needs to go to [facility] SNF, she prefers [facility]  She has a rolling walker and a 4 prong cane at home and will need a platform to go on the walker if she goes home, she will also need HH if she goes home, PT will continue to work with her Curator done Expected Discharge Plan: Home w Home Health Services Barriers to Discharge: Continued Medical Work up  Expected Discharge Plan and Services   Discharge Planning Services: CM Consult   Living arrangements for the past 2 months: Single Family Home                 DME Arranged:  (TBD)                     Social Determinants of Health (SDOH) Interventions SDOH Screenings   Food Insecurity: No Food Insecurity (09/01/2022)  Housing: Low Risk  (09/01/2022)  Transportation Needs: No Transportation Needs (09/01/2022)  Utilities: Not At Risk (09/01/2022)  Depression (PHQ2-9): Medium Risk (06/12/2022)  Financial Resource Strain: Low Risk  (05/08/2020)  Tobacco Use: Low Risk  (09/02/2022)    Readmission Risk Interventions     No data to display

## 2022-09-02 NOTE — Evaluation (Signed)
Physical Therapy Evaluation Patient Details Name: [name] MRN: [id number] DOB: [birth year] Today's Date: 09/02/2022  History of Present Illness  Lianny Ollar is a 71yoF who comes to [facility] on 09/01/22 for elective Left TSA. Pt is known to our services from several prior admissions. PMH: DM, AF, CHF, LEE, recurrent UTI, recurrent skin yeast ABD/breast, multiple CVA c left hemiplegia.  Clinical Impression  Pt in bed on entry, awake, agreeable to session. Pain is adequately controlled generally, grimacing with sling adjustments, I have asked RN about additional pain meds. Pt requires minA to EOB, maximal effort and several redos to achieve standing. Step-pivot from EOB to recliner takes >3 minutes and is not completed without total assist from [employer]. Pt is set up with meal at EOS. Pt is largely tangential, easily distractible by internal thought process, shows limited insight into multiple areas that would complicate her ability to be safe without supervision. I am not sure that pt will achieve any appreciable AMB prowess with QC given this has been a presurgical goal/attempt for at least 15 months per chart- does not sound like husband feels this to be an adequate AD for mobility per pt reports- pt also says it's her goal to transition from RW to QC 'by the end of this year', said with disregard to her current surgical status. Author encouraged her to focus on LUE healing, rehab, and safety. Will continue to follow.      Recommendations for follow up therapy are one component of a multi-disciplinary discharge planning process, led by the attending physician.  Recommendations may be updated based on patient status, additional functional criteria and insurance authorization.  Follow Up Recommendations Can patient physically be transported by private vehicle: No     Assistance Recommended at Discharge Frequent or constant Supervision/Assistance  Patient can return home with the following  A lot of  help with bathing/dressing/bathroom;Two people to help with walking and/or transfers;Help with stairs or ramp for entrance;Assist for transportation;Direct supervision/assist for financial management    Equipment Recommendations None recommended by PT  Recommendations for Other Services       Functional Status Assessment Patient has had a recent decline in their functional status and demonstrates the ability to make significant improvements in function in a reasonable and predictable amount of time.     Precautions / Restrictions        Mobility  Bed Mobility Overal bed mobility: Needs Assistance Bed Mobility: Supine to Sit     Supine to sit: Min assist     General bed mobility comments: to EOB Left, uses rail but sill needs minA, then 1-2 minutes to achieve upright trunk without use of hand for support    Transfers Overall transfer level: Needs assistance Equipment used: Quad cane Transfers: Sit to/from Stand, Bed to chair/wheelchair/BSC Sit to Stand: Min guard (max effort, multiple attempts to achieve; balance is precarious)   Step pivot transfers: Total assist (struggles to move QC adn RLE a few times, but LLE is essentially stuck to floor; author ultimately has to bring chair to pt once presyncope commences.)       General transfer comment: max effort required to rise; bed to (almost) chair transition takes 3-4 minutes before pt is unable to go farther, begings having hot flashes and racing HR. Pt struggles to move Left foot from floor, unsafe use of QC, body position to bed, pt places all blame on QC limited insight into ability to modify device use to improve safety/confidence.  Ambulation/Gait Ambulation/Gait assistance:  (unsafe to attempt at present. It is not clear that pt is regularly AMB of meaningful distances at recent baseline.)                Stairs            Wheelchair Mobility    Modified Rankin (Stroke Patients Only)       Balance                                              Pertinent Vitals/Pain Pain Assessment Pain Assessment: 0-10 Pain Location: grimmacing when sling is adjusted, otherwise, pain appears adequately controlled. Pain Intervention(s): Monitored during session, Limited activity within patient's tolerance, Premedicated before session    Home Living Family/patient expects to be discharged to:: Private residence Living Arrangements: Spouse/significant other Available Help at Discharge: Family Type of Home: House Home Access: Ramped entrance       Home Layout: One level Home Equipment: Agricultural consultant (2 wheels);Cane - quad;Wheelchair - manual;Tub bench;Hospital bed Additional Comments: at one point 2 years ago had a hoyer lift and WC in place at home; need to get updates.    Prior Function Prior Level of Function : Needs assist             Mobility Comments: Per PT notes March 2023- pt was using RW for household distances only, chair follow performed by husband due to high falls risk. 09/02/22 pt reports some attempted use of QC when her husband is not looking, but that he doesn't want her using it.       Hand Dominance        Extremity/Trunk Assessment        Lower Extremity Assessment Lower Extremity Assessment: LLE deficits/detail LLE Deficits / Details: chronic hemiparetic deficits s/p remote CVA       Communication      Cognition   Behavior During Therapy: Anxious, Impulsive Overall Cognitive Status: History of cognitive impairments - at baseline                                          General Comments      Exercises Total Joint Exercises Ankle Circles/Pumps:  (not able to begin these; pt's baseline mental status may be a longterm barrier to HEP independence; multiple cognitive deficits this date that would preclude successful adn safe performance.)   Assessment/Plan    PT Assessment Patient needs continued PT services  PT  Problem List Decreased strength;Decreased activity tolerance;Decreased balance;Decreased mobility;Decreased coordination;Decreased cognition;Obesity       PT Treatment Interventions DME instruction;Gait training;Functional mobility training;Therapeutic activities;Therapeutic exercise;Balance training;Neuromuscular re-education    PT Goals (Current goals can be found in the Care Plan section)  Acute Rehab PT Goals Patient Stated Goal: be able to walk with quad cane PT Goal Formulation: With patient Time For Goal Achievement: 09/16/22 Potential to Achieve Goals: Poor    Frequency Min 4X/week     Co-evaluation               AM-PAC PT "6 Clicks" Mobility  Outcome Measure Help needed turning from your back to your side while in a flat bed without using bedrails?: A Lot Help needed moving from lying on your back to sitting on the side  of a flat bed without using bedrails?: A Lot Help needed moving to and from a bed to a chair (including a wheelchair)?: Total Help needed standing up from a chair using your arms (e.g., wheelchair or bedside chair)?: Total Help needed to walk in hospital room?: Total Help needed climbing 3-5 steps with a railing? : Total 6 Click Score: 8    End of Session   Activity Tolerance: Patient limited by fatigue;Treatment limited secondary to medical complications (Comment) Patient left: in chair;with call bell/phone within reach (breakfast tray presented) Nurse Communication: Patient requests pain meds PT Visit Diagnosis: Unsteadiness on feet (R26.81);Other abnormalities of gait and mobility (R26.89);Difficulty in walking, not elsewhere classified (R26.2);History of falling (Z91.81)    Time: 9562-1308 PT Time Calculation (min) (ACUTE ONLY): 34 min   Charges:   PT Evaluation $PT Eval High Complexity: 1 High PT Treatments $Therapeutic Activity: 8-22 mins       9:29 AM, 09/02/22 Rosamaria Lints, PT, DPT Physical Therapist - [facility]  [phone] (ASCOM)    Jailine Lieder C 09/02/2022, 9:22 AM

## 2022-09-03 LAB — GLUCOSE, CAPILLARY
Glucose-Capillary: 159 mg/dL — ABNORMAL HIGH (ref 70–99)
Glucose-Capillary: 145 mg/dL — ABNORMAL HIGH (ref 70–99)
Glucose-Capillary: 202 mg/dL — ABNORMAL HIGH (ref 70–99)
Glucose-Capillary: 195 mg/dL — ABNORMAL HIGH (ref 70–99)

## 2022-09-03 MED ORDER — TIRZEPATIDE 7.5 MG/0.5ML ~~LOC~~ SOAJ
7.5000 mg | Freq: Once | SUBCUTANEOUS | Status: AC
  Administered 2022-09-03: 7.5 mg via SUBCUTANEOUS
  Filled 2022-09-03: qty 0.5

## 2022-09-03 NOTE — Progress Notes (Signed)
pT'S CPAP VISIBLY CHECKED.

## 2022-09-03 NOTE — Progress Notes (Signed)
Subjective: 2 Days Post-Op Procedure(s) (LRB): REVERSE SHOULDER ARTHROPLASTY (Left) Patient reports pain as mild. Patient is well, and has had no acute complaints or problems Denies any CP, SOB, ABD pain. We will continue therapy today.  Plan is to go to SNF  Objective: Vital signs in last 24 hours: Temp:  [97.4 F (36.3 C)-98 F (36.7 C)] 97.4 F (36.3 C) (06/13 0803) Pulse Rate:  [69-76] 74 (06/13 0803) Resp:  [16-18] 18 (06/13 0803) BP: (94-116)/(55-68) 114/68 (06/13 0803) SpO2:  [94 %-97 %] 97 % (06/13 0803)  Intake/Output from previous day: 06/12 0701 - 06/13 0700 In: -  Out: 400 [Urine:400] Intake/Output this shift: No intake/output data recorded.  Recent Labs    09/02/22 0427  HGB 9.8*   Recent Labs    09/02/22 0427  WBC 7.2  RBC 3.38*  HCT 31.9*  PLT 175   Recent Labs    09/02/22 0427  NA 137  K 4.3  CL 105  CO2 22  BUN 28*  CREATININE 1.43*  GLUCOSE 203*  CALCIUM 7.3*   No results for input(s): "LABPT", "INR" in the last 72 hours.  EXAM General - Patient is Alert, Appropriate, and Oriented Left Upper Extremity - Neurovascular intact Sensation intact distally Intact pulses distally No cellulitis present Compartment soft Full composite fist Dressing - scant drainage, No active drainage Motor Function - intact, moving foot and toes well on exam.   Past Medical History:  Diagnosis Date   Anemia    Anxiety    a.) on BZO (alprazolam) PRN   Arthritis    Asthma    Atypical chest pain    a. 03/2018 MV: No ischemia/infarct. EF >65%.   Carotid arterial disease (HCC)    a. 03/2020 U/S: RICA <50, LICA nl.   Chronic heart failure with preserved ejection fraction (HFpEF) (HCC)    a. 08/2013 Echo: EF 60-65%; b. 08/2019 Echo: EF 60-65%; c. 03/2020 Echo: EF 60-65%; d. 05/2021 Echo: EF 55-60%, no rmwa, nl RV fxn, RVSP 54.3mmHg. Mildly dil LA, mild MR, mod TR.   Chronic, continuous use of opioids    a.) has naloxone Rx available   CKD (chronic kidney  disease), stage III (HCC)    Complication of anesthesia    a.) RIGHT hemidiaphragm paralysis s/p interscalene block for shoulder arthroplasty in 2017 --> caused "permanent lung damage" per patient --> REFUSES further neuraxial anesthesia   Cystocele    Deep vein thrombosis (DVT) of right lower extremity (HCC)    Depression    Diaphragm paralysis 2018   a.) RIGHT hemidiaphragm paralysis s/p interscalene block for shoulder arthroplasty in 2017; refuses additional regional/neuraxial anesthesia   DM (diabetes mellitus) type II, controlled, with peripheral vascular disorder (HCC) 08/31/2014   Dyspnea    11/08/2018 - "more now due to lack of exercise"   Edema    Fatty liver    GERD (gastroesophageal reflux disease)    Gout    Heart murmur    Hemiparesis affecting left side as late effect of cerebrovascular accident (HCC)    Hiatal hernia    a.) s/p repair   History of IBS    History of methicillin resistant staphylococcus aureus (MRSA) 2008   History of multiple strokes    Hyperlipidemia    Hypertension    Hypothyroidism    Insomnia    Long term current use of anticoagulant    a.) apixaban   Mitral regurgitation    a. 05/2021 Echo: Mild MR.   Nephrolithiasis  Neuropathy involving both lower extremities    Non-healing ulcer of left foot (HCC)    Obesity, Class II, BMI 35-39.9, with comorbidity    OSA on CPAP    PAF (paroxysmal atrial fibrillation) (HCC)    a.) 07/2013 Event monitor: Freq PVCs and 3 short runs of Afib; b.) CHA2DS2VASc = 8 (age, sex, CHF, HTN, CVA/DVT x2, vascular disease history, T2DM);  b.) rate/rhythm maintained without pharmacological intervention; chronically anticoagulated with apixaban   PAH (pulmonary artery hypertension) (HCC)    a. 05/2021 Echo: RVSP 54.64mmHg.   Peripheral vascular disease (HCC)    a. 03/2018 LE Angio: Nl renal arteries. Nl aorta & iliacs. Nl LLE arterial flow; b. 06/2020 ABI's: Nl bilat.   T2DM (type 2 diabetes mellitus) (HCC)    Tricuspid  regurgitation    a. 05/2021 Echo: Mod TR (in setting of RVSP 54.29mmHg).   Umbilical hernia    a.) s/p repair   Urinary incontinence    Venous stasis dermatitis of both lower extremities     Assessment/Plan:   2 Days Post-Op Procedure(s) (LRB): REVERSE SHOULDER ARTHROPLASTY (Left) Principal Problem:   S/p reverse total shoulder arthroplasty Active Problems:   S/P reverse total shoulder arthroplasty, right  Estimated body mass index is 45.83 kg/m as calculated from the following:   Height as of this encounter: 5\' 4"  (1.626 m).   Weight as of this encounter: 121.1 kg. Advance diet Up with therapy  Pain controlled  VSS  CM to assist with discharge to SNF. Slow progress with PT. No assistance at home. Patient open to SNF.   DVT Prophylaxis - TED hose, SCDs, Eliquis   T. Cranston Neighbor, PA-C [facility] 09/03/2022, 9:55 AM

## 2022-09-03 NOTE — TOC Progression Note (Signed)
Transition of Care [facility]) - Progression Note    Patient Details  Name: [name] MRN: [id number] Date of Birth: 11-Dec-1950  [facility]) CM/SW Contact  Marlowe Sax, RN Phone Number: 09/03/2022, 12:18 PM  Clinical Narrative:     Met with the patient to review the bed offers, she chose [facility] Ins pending  Expected Discharge Plan: Home w Home Health Services Barriers to Discharge: Continued Medical Work up  Expected Discharge Plan and Services   Discharge Planning Services: CM Consult   Living arrangements for the past 2 months: Single Family Home                 DME Arranged:  (TBD)                     Social Determinants of Health (SDOH) Interventions SDOH Screenings   Food Insecurity: No Food Insecurity (09/01/2022)  Housing: Low Risk  (09/01/2022)  Transportation Needs: No Transportation Needs (09/01/2022)  Utilities: Not At Risk (09/01/2022)  Depression (PHQ2-9): Medium Risk (06/12/2022)  Financial Resource Strain: Low Risk  (05/08/2020)  Tobacco Use: Low Risk  (09/02/2022)    Readmission Risk Interventions     No data to display

## 2022-09-03 NOTE — Progress Notes (Signed)
Occupational Therapy Treatment Patient Details Name: [name] MRN: [id number] DOB: 10/20/1950 Today's Date: 09/03/2022   History of present illness [name] is a 71yoF who comes [facility] on 09[facility]01[facility]22 for elective Left TSA. Pt is known to our services from several prior admissions. PMH: DM, AF, CHF, LEE, recurrent UTI, recurrent skin yeast ABD/breast, multiple CVA c left hemiplegia.   OT comments  Pt seen for OT Tx this date with co-tx PT to optimize safety and ADL mobility. Pt endorsing nursing did not assist her with her CPAP the previous night. Active listening provided. RN notified so night shift can assist tonight. Pt endorsing some pain in LUE, 6/10. Pt required MOD A for bed mobility, MOD A +2 for STS and step pivot to the recliner. Pt fearful, prefers to have handheld assist x2 than to attempt using her QC. Pt required MAX A for sling and polar care mgt. Removed and replaced for improved fit and positioning with pt noting it feels better. Photos taken with spouse's phone so they have personal reference for optimal positioning. Pt required heavy VC throughout session to redirect to task and to maintain LUE NWBing and no shoulder ROM. Pt continues to benefit from skilled OT services.    Recommendations for follow up therapy are one component of a multi-disciplinary discharge planning process, led by the attending physician.  Recommendations may be updated based on patient status, additional functional criteria and insurance authorization.    Assistance Recommended at Discharge Frequent or constant Supervision/Assistance  Patient can return home with the following  Two people to help with walking and/or transfers;Two people to help with bathing/dressing/bathroom;Help with stairs or ramp for entrance   Equipment Recommendations  BSC/3in1    Recommendations for Other Services      Precautions / Restrictions Precautions Precautions: Fall Restrictions Weight Bearing Restrictions:  Yes LUE Weight Bearing: Non weight bearing       Mobility Bed Mobility Overal bed mobility: Needs Assistance Bed Mobility: Supine to Sit     Supine to sit: Mod assist, HOB elevated     General bed mobility comments: MOD A for trunk elevation to L side    Transfers Overall transfer level: Needs assistance Equipment used: 2 person hand held assist Transfers: Sit to/from Stand, Bed to chair/wheelchair/BSC Sit to Stand: Mod assist, +2 physical assistance     Step pivot transfers: +2 physical assistance, Mod assist, Min assist     General transfer comment: VC sequencing, difficulty lifting L foot off the ground     Balance Overall balance assessment: Needs assistance Sitting-balance support: No upper extremity supported, Feet supported Sitting balance-Leahy Scale: Fair     Standing balance support: Bilateral upper extremity supported Standing balance-Leahy Scale: Poor                             ADL either performed or assessed with clinical judgement   ADL Overall ADL's : Needs assistance/impaired             Lower Body Bathing: Sit to/from stand;+2 for physical assistance;Maximal assistance Lower Body Bathing Details (indicate cue type and reason): MAX A +2 for donning undergarments over hips Upper Body Dressing : Sitting;Moderate assistance                          Extremity/Trunk Assessment              Vision  Perception     Praxis      Cognition Arousal/Alertness: Awake/alert Behavior During Therapy: Anxious Overall Cognitive Status: History of cognitive impairments - at baseline                                 General Comments: cues for commands/sequencing, redirecting attention        Exercises Other Exercises Other Exercises: Pt/spouse instructed in sling mgt, polar care mgt, and falls rpevention. Both appriated Other Exercises: Pt/spouse instructed in NWBing precautions for LLE repeatedly  durign session 2/2 poor follow through    Shoulder Instructions       General Comments      Pertinent Vitals/ Pain       Pain Assessment Pain Assessment: No/denies pain Pain Score: 6  Pain Location: L shoulder Pain Descriptors / Indicators: Aching, Other (Comment) (pt noted difficulty describing, stating it just feels different) Pain Intervention(s): Limited activity within patient's tolerance, Premedicated before session, Repositioned  Home Living                                          Prior Functioning/Environment              Frequency  Min 2X/week        Progress Toward Goals  OT Goals(current goals can now be found in the care plan section)  Progress towards OT goals: Progressing toward goals  Acute Rehab OT Goals Patient Stated Goal: go home OT Goal Formulation: With patient/family Time For Goal Achievement: 09/16/22 Potential to Achieve Goals: Fair  Plan Discharge plan remains appropriate;Frequency remains appropriate    Co-evaluation    PT/OT/SLP Co-Evaluation/Treatment: Yes Reason for Co-Treatment: Complexity of the patient's impairments (multi-system involvement);For patient/therapist safety;To address functional/ADL transfers PT goals addressed during session: Mobility/safety with mobility;Balance;Proper use of DME OT goals addressed during session: ADL's and self-care;Proper use of Adaptive equipment and DME      AM-PAC OT "6 Clicks" Daily Activity     Outcome Measure   Help from another person eating meals?: A Little Help from another person taking care of personal grooming?: A Little Help from another person toileting, which includes using toliet, bedpan, or urinal?: A Lot Help from another person bathing (including washing, rinsing, drying)?: A Lot Help from another person to put on and taking off regular upper body clothing?: A Little Help from another person to put on and taking off regular lower body clothing?: A  Lot 6 Click Score: 15    End of Session Equipment Utilized During Treatment: Gait belt;Rolling walker (2 wheels)  OT Visit Diagnosis: Other abnormalities of gait and mobility (R26.89);Muscle weakness (generalized) (M62.81)   Activity Tolerance Patient tolerated treatment well   Patient Left in chair;with call bell/phone within reach;with chair alarm set   Nurse Communication          Time: [phone] OT Time Calculation (min): 34 min  Charges: OT General Charges $OT Visit: 1 Visit OT Treatments $Self Care/Home Management : 8-22 mins  Arman Filter., MPH, MS, OTR/L ascom [phone] 09/03/22, 2:00 PM

## 2022-09-03 NOTE — Progress Notes (Signed)
Physical Therapy Treatment Patient Details Name: [name] MRN: [id number] DOB: 11/14/1950 Today's Date: 09/03/2022   History of Present Illness [name] is a 71yoF who comes to [facility] on 09/01/22 for elective Left TSA. Pt is known to our services from several prior admissions. PMH: DM, AF, CHF, LEE, recurrent UTI, recurrent skin yeast ABD/breast, multiple CVA c left hemiplegia.    PT Comments    Pt seen with OT today for pt and therapist safety due to increased assist needed for mobility and NWB status L UE. ModA to transfer to EOB with HOB raised. Good sitting balance with feet supported x several minutes. ModA of 2 to stand from bed and attain upright balance with support of 2 and use of personal SBQC. Pt able to side step to bedside chair with Min/ModA of 2 for weight shifting and balance. Will continue to progress per POC until transition to short term skilled nursing.   Recommendations for follow up therapy are one component of a multi-disciplinary discharge planning process, led by the attending physician.  Recommendations may be updated based on patient status, additional functional criteria and insurance authorization.  Follow Up Recommendations  Can patient physically be transported by private vehicle: No    Assistance Recommended at Discharge Frequent or constant Supervision/Assistance  Patient can return home with the following A lot of help with bathing/dressing/bathroom;Two people to help with walking and/or transfers;Help with stairs or ramp for entrance;Assist for transportation;Direct supervision/assist for financial management   Equipment Recommendations  None recommended by PT    Recommendations for Other Services       Precautions / Restrictions Precautions Precautions: Fall Restrictions Weight Bearing Restrictions: Yes LUE Weight Bearing: Non weight bearing Other Position/Activity Restrictions: L UE sling support     Mobility  Bed Mobility Overal bed  mobility: Needs Assistance Bed Mobility: Supine to Sit     Supine to sit: Mod assist, HOB elevated     General bed mobility comments: MOD A for trunk elevation to L side    Transfers Overall transfer level: Needs assistance Equipment used: 2 person hand held assist Transfers: Sit to/from Stand, Bed to chair/wheelchair/BSC Sit to Stand: Mod assist, +2 physical assistance   Step pivot transfers: +2 physical assistance, Mod assist, Min assist       General transfer comment:  (pt required physical assist to steady self and weight shift to attain upright standing)    Ambulation/Gait               General Gait Details:  (Short distance side stepping towards recliner with SBQC and Min/Mod A of 2)   Stairs             Wheelchair Mobility    Modified Rankin (Stroke Patients Only)       Balance Overall balance assessment: Needs assistance Sitting-balance support: No upper extremity supported, Feet supported Sitting balance-Leahy Scale: Fair     Standing balance support: Bilateral upper extremity supported Standing balance-Leahy Scale: Poor                              Cognition Arousal/Alertness: Awake/alert Behavior During Therapy: Anxious Overall Cognitive Status: History of cognitive impairments - at baseline                                 General Comments: cues for commands/sequencing, redirecting attention  Exercises      General Comments General comments (skin integrity, edema, etc.):  (Co-tx with OT who assisted with L UE sling repositioning and education)      Pertinent Vitals/Pain Pain Assessment Pain Assessment: 0-10 Pain Score: 5  Pain Location: L shoulder Pain Descriptors / Indicators: Aching, Other (Comment) Pain Intervention(s): Limited activity within patient's tolerance, Monitored during session    Home Living                          Prior Function            PT Goals (current  goals can now be found in the care plan section) Acute Rehab PT Goals Patient Stated Goal: be able to walk with quad cane Progress towards PT goals: Progressing toward goals    Frequency    Min 4X/week      PT Plan Current plan remains appropriate    Co-evaluation PT/OT/SLP Co-Evaluation/Treatment: Yes Reason for Co-Treatment: Complexity of the patient's impairments (multi-system involvement);For patient/therapist safety;To address functional/ADL transfers PT goals addressed during session: Mobility/safety with mobility;Balance;Proper use of DME OT goals addressed during session: ADL's and self-care;Proper use of Adaptive equipment and DME      AM-PAC PT "6 Clicks" Mobility   Outcome Measure  Help needed turning from your back to your side while in a flat bed without using bedrails?: A Lot Help needed moving from lying on your back to sitting on the side of a flat bed without using bedrails?: A Lot Help needed moving to and from a bed to a chair (including a wheelchair)?: Total Help needed standing up from a chair using your arms (e.g., wheelchair or bedside chair)?: Total Help needed to walk in hospital room?: Total Help needed climbing 3-5 steps with a railing? : Total 6 Click Score: 8    End of Session Equipment Utilized During Treatment: Gait belt Activity Tolerance: Patient limited by fatigue;Treatment limited secondary to medical complications (Comment) Patient left: in chair;with call bell/phone within reach;with family/visitor present   PT Visit Diagnosis: Unsteadiness on feet (R26.81);Other abnormalities of gait and mobility (R26.89);Difficulty in walking, not elsewhere classified (R26.2);History of falling (Z91.81)     Time: 0981-1914 PT Time Calculation (min) (ACUTE ONLY): 35 min  Charges:  $Therapeutic Activity: 8-22 mins                    Zadie Cleverly, PTA  Jannet Askew 09/03/2022, 4:31 PM

## 2022-09-04 LAB — TYPE AND SCREEN
ABO/RH(D): AB POS
Unit division: 0
Unit division: 0

## 2022-09-04 LAB — BPAM RBC
Blood Product Expiration Date: 202406282359
Blood Product Expiration Date: 202407072359
Unit Type and Rh: 6200

## 2022-09-04 LAB — GLUCOSE, CAPILLARY
Glucose-Capillary: 131 mg/dL — ABNORMAL HIGH (ref 70–99)
Glucose-Capillary: 161 mg/dL — ABNORMAL HIGH (ref 70–99)
Glucose-Capillary: 107 mg/dL — ABNORMAL HIGH (ref 70–99)
Glucose-Capillary: 149 mg/dL — ABNORMAL HIGH (ref 70–99)

## 2022-09-04 MED ORDER — FLEET ENEMA 7-19 GM/118ML RE ENEM: 1.0000 | ENEMA | Freq: Every day | RECTAL | Status: DC | PRN

## 2022-09-04 MED ORDER — LOSARTAN POTASSIUM 50 MG PO TABS
50.0000 mg | ORAL_TABLET | Freq: Every day | ORAL | Status: DC
  Administered 2022-09-05 – 2022-09-07 (×3): 50 mg via ORAL
  Filled 2022-09-04 (×3): qty 1

## 2022-09-04 MED ORDER — BISACODYL 10 MG RE SUPP: 10.0000 mg | Freq: Every day | RECTAL | Status: DC | PRN

## 2022-09-04 MED ORDER — POLYETHYLENE GLYCOL 3350 17 G PO PACK
17.0000 g | PACK | Freq: Every day | ORAL | Status: DC
  Administered 2022-09-04: 17 g via ORAL
  Filled 2022-09-04 (×4): qty 1

## 2022-09-04 NOTE — Progress Notes (Signed)
Subjective: 3 Days Post-Op Procedure(s) (LRB): REVERSE SHOULDER ARTHROPLASTY (Left) Patient reports pain as mild. Patient is well, and has had no acute complaints or problems Denies any CP, SOB, ABD pain. We will continue therapy today.  Plan is to go to SNF  Objective: Vital signs in last 24 hours: Temp:  [97.6 F (36.4 C)-97.8 F (36.6 C)] 97.6 F (36.4 C) (06/14 0802) Pulse Rate:  [63-94] 94 (06/14 0802) Resp:  [17-18] 17 (06/14 0802) BP: (92-98)/(55-60) 93/55 (06/14 0802) SpO2:  [92 %-97 %] 97 % (06/14 0802) FiO2 (%):  [21 %] 21 % (06/13 1932)  Intake/Output from previous day: 06/13 0701 - 06/14 0700 In: 1100 [P.O.:1100] Out: 600 [Urine:600] Intake/Output this shift: No intake/output data recorded.  Recent Labs    09/02/22 0427  HGB 9.8*   Recent Labs    09/02/22 0427  WBC 7.2  RBC 3.38*  HCT 31.9*  PLT 175   Recent Labs    09/02/22 0427  NA 137  K 4.3  CL 105  CO2 22  BUN 28*  CREATININE 1.43*  GLUCOSE 203*  CALCIUM 7.3*   No results for input(s): "LABPT", "INR" in the last 72 hours.  EXAM General - Patient is Alert, Appropriate, and Oriented Left Upper Extremity - Neurovascular intact Sensation intact distally Intact pulses distally No cellulitis present Compartment soft Full composite fist Dressing - scant drainage, No active drainage Motor Function - intact, moving foot and toes well on exam.   Past Medical History:  Diagnosis Date   Anemia    Anxiety    a.) on BZO (alprazolam) PRN   Arthritis    Asthma    Atypical chest pain    a. 03/2018 MV: No ischemia/infarct. EF >65%.   Carotid arterial disease (HCC)    a. 03/2020 U/S: RICA <50, LICA nl.   Chronic heart failure with preserved ejection fraction (HFpEF) (HCC)    a. 08/2013 Echo: EF 60-65%; b. 08/2019 Echo: EF 60-65%; c. 03/2020 Echo: EF 60-65%; d. 05/2021 Echo: EF 55-60%, no rmwa, nl RV fxn, RVSP 54.36mmHg. Mildly dil LA, mild MR, mod TR.   Chronic, continuous use of opioids     a.) has naloxone Rx available   CKD (chronic kidney disease), stage III (HCC)    Complication of anesthesia    a.) RIGHT hemidiaphragm paralysis s/p interscalene block for shoulder arthroplasty in 2017 --> caused "permanent lung damage" per patient --> REFUSES further neuraxial anesthesia   Cystocele    Deep vein thrombosis (DVT) of right lower extremity (HCC)    Depression    Diaphragm paralysis 2018   a.) RIGHT hemidiaphragm paralysis s/p interscalene block for shoulder arthroplasty in 2017; refuses additional regional/neuraxial anesthesia   DM (diabetes mellitus) type II, controlled, with peripheral vascular disorder (HCC) 08/31/2014   Dyspnea    11/08/2018 - "more now due to lack of exercise"   Edema    Fatty liver    GERD (gastroesophageal reflux disease)    Gout    Heart murmur    Hemiparesis affecting left side as late effect of cerebrovascular accident (HCC)    Hiatal hernia    a.) s/p repair   History of IBS    History of methicillin resistant staphylococcus aureus (MRSA) 2008   History of multiple strokes    Hyperlipidemia    Hypertension    Hypothyroidism    Insomnia    Long term current use of anticoagulant    a.) apixaban   Mitral regurgitation  a. 05/2021 Echo: Mild MR.   Nephrolithiasis    Neuropathy involving both lower extremities    Non-healing ulcer of left foot (HCC)    Obesity, Class II, BMI 35-39.9, with comorbidity    OSA on CPAP    PAF (paroxysmal atrial fibrillation) (HCC)    a.) 07/2013 Event monitor: Freq PVCs and 3 short runs of Afib; b.) CHA2DS2VASc = 8 (age, sex, CHF, HTN, CVA/DVT x2, vascular disease history, T2DM);  b.) rate/rhythm maintained without pharmacological intervention; chronically anticoagulated with apixaban   PAH (pulmonary artery hypertension) (HCC)    a. 05/2021 Echo: RVSP 54.13mmHg.   Peripheral vascular disease (HCC)    a. 03/2018 LE Angio: Nl renal arteries. Nl aorta & iliacs. Nl LLE arterial flow; b. 06/2020 ABI's: Nl bilat.    T2DM (type 2 diabetes mellitus) (HCC)    Tricuspid regurgitation    a. 05/2021 Echo: Mod TR (in setting of RVSP 54.54mmHg).   Umbilical hernia    a.) s/p repair   Urinary incontinence    Venous stasis dermatitis of both lower extremities     Assessment/Plan:   3 Days Post-Op Procedure(s) (LRB): REVERSE SHOULDER ARTHROPLASTY (Left) Principal Problem:   S/p reverse total shoulder arthroplasty Active Problems:   S/P reverse total shoulder arthroplasty, right  Estimated body mass index is 45.83 kg/m as calculated from the following:   Height as of this encounter: 5\' 4"  (1.626 m).   Weight as of this encounter: 121.1 kg. Advance diet Up with therapy  Pain controlled  VSS, BP soft. Asymptomatic. Hold losartan this am. Monitor BP closely.  CM to assist with discharge to SNF. Stable for DC to SNF. Slow progress with PT. No assistance at home. Patient open to SNF.   DVT Prophylaxis - TED hose, SCDs, Eliquis   T. Cranston Neighbor, PA-C [facility] 09/04/2022, 8:09 AM

## 2022-09-04 NOTE — Plan of Care (Signed)
  Problem: Education: Goal: Knowledge of the prescribed therapeutic regimen will improve Outcome: Progressing   Problem: Activity: Goal: Ability to tolerate increased activity will improve Outcome: Progressing   Problem: Pain Management: Goal: Pain level will decrease with appropriate interventions Outcome: Progressing   Problem: Coping: Goal: Ability to adjust to condition or change in health will improve Outcome: Progressing   Problem: Fluid Volume: Goal: Ability to maintain a balanced intake and output will improve Outcome: Progressing   Problem: Health Behavior/Discharge Planning: Goal: Ability to identify and utilize available resources and services will improve Outcome: Progressing

## 2022-09-04 NOTE — Plan of Care (Signed)
  Problem: Activity: Goal: Ability to tolerate increased activity will improve Outcome: Progressing   

## 2022-09-04 NOTE — TOC Progression Note (Signed)
Transition of Care [facility]) - Progression Note    Patient Details  Name: [name] MRN: [id number] Date of Birth: 13-Aug-1950  [facility] CM/SW Contact  Marlowe Sax, RN Phone Number: 09/04/2022, 11:59 AM  Clinical Narrative:    Drucie Opitz approved Auth number [id number] 6/14-6/26 To go to [facility], [facility] can not accept until Monday   Expected Discharge Plan: Home w Home Health Services Barriers to Discharge: Continued Medical Work up  Expected Discharge Plan and Services   Discharge Planning Services: CM Consult   Living arrangements for the past 2 months: Single Family Home                 DME Arranged:  (TBD)                     Social Determinants of Health (SDOH) Interventions SDOH Screenings   Food Insecurity: No Food Insecurity (09/01/2022)  Housing: Low Risk  (09/01/2022)  Transportation Needs: No Transportation Needs (09/01/2022)  Utilities: Not At Risk (09/01/2022)  Depression (PHQ2-9): Medium Risk (06/12/2022)  Financial Resource Strain: Low Risk  (05/08/2020)  Tobacco Use: Low Risk  (09/02/2022)    Readmission Risk Interventions     No data to display

## 2022-09-04 NOTE — Progress Notes (Signed)
Physical Therapy Treatment Patient Details Name: [name] MRN: [id number] DOB: 1950/11/14 Today's Date: 09/04/2022   History of Present Illness Kirsti Deleeuw is a 71yoF who comes to [facility] on 09/01/22 for elective Left TSA. Pt is known to our services from several prior admissions. PMH: DM, AF, CHF, LEE, recurrent UTI, recurrent skin yeast ABD/breast, multiple CVA c left hemiplegia.    PT Comments    Pt pleasant and motivated for therapy. Pt required min A for supine > sit for trunk control and HHA from therapist; increased time and effort required. Pt failed first attempt at STS with quad cane but was able to perform subsequent attempt with hemi walker and elevated bed height, min A for elevation and stability. Pt able to take a couple small, shuffling steps at EOB with HW and mod A for stability. Mod verbal cues needed for proper use and sequencing of HW. Pt's vitals remained stable throughout the session. She denied any dizziness or lightheadedness with sitting up/standing. BP and HR as follows: in supine, 94/69 BP, 67 HR; in sitting, 116/83 BP, 88 HR. Pt unable to tolerate standing with no UE support so standing BP deferred. Pt will benefit from continued PT services upon discharge to safely address deficits listed in patient problem list for decreased caregiver assistance and eventual return to PLOF.   Recommendations for follow up therapy are one component of a multi-disciplinary discharge planning process, led by the attending physician.  Recommendations may be updated based on patient status, additional functional criteria and insurance authorization.  Follow Up Recommendations  Can patient physically be transported by private vehicle: No    Assistance Recommended at Discharge Frequent or constant Supervision/Assistance  Patient can return home with the following A lot of help with bathing/dressing/bathroom;Two people to help with walking and/or transfers;Help with stairs or ramp for  entrance;Assist for transportation;Direct supervision/assist for financial management   Equipment Recommendations  None recommended by PT    Recommendations for Other Services       Precautions / Restrictions Precautions Precautions: Fall Required Braces or Orthoses: Sling Restrictions Weight Bearing Restrictions: Yes LUE Weight Bearing: Non weight bearing Other Position/Activity Restrictions: L UE sling support     Mobility  Bed Mobility Overal bed mobility: Needs Assistance Bed Mobility: Supine to Sit, Sit to Supine     Supine to sit: Min assist Sit to supine: Mod assist, +2 for physical assistance   General bed mobility comments: Min A and HHA for supine > sit for trunk support and elevation, increased time/effort. Mod A + 2 for sit > supine at end of session.    Transfers Overall transfer level: Needs assistance Equipment used: Hemi-walker Transfers: Sit to/from Stand Sit to Stand: Min assist           General transfer comment: Min A for elevation and stability, elevated bed height. One failed STS attempt with quad cane but able to successfully stand with hemiwalker. Verbal cues for RUE assist on HW.    Ambulation/Gait Ambulation/Gait assistance: Mod assist Gait Distance (Feet): 2 Feet Assistive device: Hemi-walker Gait Pattern/deviations: Step-to pattern, Shuffle, Trunk flexed Gait velocity: decreased     General Gait Details: Pt able to take small, shuffling steps with R leg. Unable to fully lift L leg off ground so she had to shuffle/slide it across the floor limited distances. Heavy use of RUE on HW.   Stairs             Wheelchair Mobility    Modified Rankin (  Stroke Patients Only)       Balance Overall balance assessment: Needs assistance Sitting-balance support: Feet supported, No upper extremity supported Sitting balance-Leahy Scale: Fair     Standing balance support: Single extremity supported, Reliant on assistive device for  balance, During functional activity Standing balance-Leahy Scale: Poor Standing balance comment: Pt heavily reliant on RUE on HW to maintain upright balance, particularly with ambulation. Only able to tolerate ~45 seconds - 1 min standing before needing to sit                            Cognition Arousal/Alertness: Awake/alert Behavior During Therapy: WFL for tasks assessed/performed Overall Cognitive Status: Within Functional Limits for tasks assessed                                          Exercises Total Joint Exercises Ankle Circles/Pumps: AROM, Both, 10 reps, Seated Long Arc Quad: AROM, 10 reps, Seated, Both Other Exercises Other Exercises: Pt/spouse instructed in NWBing precautions for LUE    General Comments        Pertinent Vitals/Pain Pain Assessment Pain Assessment: No/denies pain Pain Intervention(s): Premedicated before session    Home Living                          Prior Function            PT Goals (current goals can now be found in the care plan section) Progress towards PT goals: Progressing toward goals    Frequency    Min 4X/week      PT Plan Current plan remains appropriate    Co-evaluation              AM-PAC PT "6 Clicks" Mobility   Outcome Measure  Help needed turning from your back to your side while in a flat bed without using bedrails?: A Little Help needed moving from lying on your back to sitting on the side of a flat bed without using bedrails?: A Little Help needed moving to and from a bed to a chair (including a wheelchair)?: A Lot Help needed standing up from a chair using your arms (e.g., wheelchair or bedside chair)?: A Lot Help needed to walk in hospital room?: A Lot Help needed climbing 3-5 steps with a railing? : Total 6 Click Score: 13    End of Session Equipment Utilized During Treatment: Gait belt Activity Tolerance: Patient tolerated treatment well Patient left: in  bed;with call bell/phone within reach;with bed alarm set;with family/visitor present Nurse Communication: Mobility status;Weight bearing status PT Visit Diagnosis: Unsteadiness on feet (R26.81);Other abnormalities of gait and mobility (R26.89);Difficulty in walking, not elsewhere classified (R26.2);History of falling (Z91.81)     Time: 1610-9604 PT Time Calculation (min) (ACUTE ONLY): 37 min  Charges:                        Cena Benton, SPT 09/04/22, 5:28 PM

## 2022-09-04 NOTE — Plan of Care (Signed)
  Problem: Education: Goal: Understanding of activity limitations/precautions following surgery will improve Outcome: Progressing   

## 2022-09-05 LAB — GLUCOSE, CAPILLARY
Glucose-Capillary: 125 mg/dL — ABNORMAL HIGH (ref 70–99)
Glucose-Capillary: 147 mg/dL — ABNORMAL HIGH (ref 70–99)
Glucose-Capillary: 111 mg/dL — ABNORMAL HIGH (ref 70–99)
Glucose-Capillary: 105 mg/dL — ABNORMAL HIGH (ref 70–99)

## 2022-09-05 NOTE — Progress Notes (Addendum)
Subjective: 4 Days Post-Op Procedure(s) (LRB): REVERSE SHOULDER ARTHROPLASTY (Left) Patient reports pain as mild. Patient is well, and has had no acute complaints or problems Denies any CP, SOB, ABD pain. We will continue therapy today.  Plan is to go to [facility] Monday + BM  Objective: Vital signs in last 24 hours: Temp:  [97.3 F (36.3 C)-97.6 F (36.4 C)] 97.5 F (36.4 C) (06/15 0836) Pulse Rate:  [63-105] 63 (06/15 0836) Resp:  [18] 18 (06/15 0836) BP: (97-111)/(53-65) 111/61 (06/15 0836) SpO2:  [92 %-95 %] 92 % (06/15 0836) FiO2 (%):  [21 %] 21 % (06/14 1946)  Intake/Output from previous day: 06/14 0701 - 06/15 0700 In: 530 [P.O.:530] Out: 1400 [Urine:1400] Intake/Output this shift: No intake/output data recorded.  No results for input(s): "HGB" in the last 72 hours.  No results for input(s): "WBC", "RBC", "HCT", "PLT" in the last 72 hours.  No results for input(s): "NA", "K", "CL", "CO2", "BUN", "CREATININE", "GLUCOSE", "CALCIUM" in the last 72 hours.  No results for input(s): "LABPT", "INR" in the last 72 hours.  EXAM General - Patient is Alert, Appropriate, and Oriented Left Upper Extremity - Neurovascular intact Sensation intact distally Intact pulses distally No cellulitis present Compartment soft Full composite fist Dressing - scant drainage, No active drainage Motor Function - intact, moving foot and toes well on exam.   Past Medical History:  Diagnosis Date   Anemia    Anxiety    a.) on BZO (alprazolam) PRN   Arthritis    Asthma    Atypical chest pain    a. 03/2018 MV: No ischemia/infarct. EF >65%.   Carotid arterial disease (HCC)    a. 03/2020 U/S: RICA <50, LICA nl.   Chronic heart failure with preserved ejection fraction (HFpEF) (HCC)    a. 08/2013 Echo: EF 60-65%; b. 08/2019 Echo: EF 60-65%; c. 03/2020 Echo: EF 60-65%; d. 05/2021 Echo: EF 55-60%, no rmwa, nl RV fxn, RVSP 54.60mmHg. Mildly dil LA, mild MR, mod TR.   Chronic, continuous use of  opioids    a.) has naloxone Rx available   CKD (chronic kidney disease), stage III (HCC)    Complication of anesthesia    a.) RIGHT hemidiaphragm paralysis s/p interscalene block for shoulder arthroplasty in 2017 --> caused "permanent lung damage" per patient --> REFUSES further neuraxial anesthesia   Cystocele    Deep vein thrombosis (DVT) of right lower extremity (HCC)    Depression    Diaphragm paralysis 2018   a.) RIGHT hemidiaphragm paralysis s/p interscalene block for shoulder arthroplasty in 2017; refuses additional regional/neuraxial anesthesia   DM (diabetes mellitus) type II, controlled, with peripheral vascular disorder (HCC) 08/31/2014   Dyspnea    11/08/2018 - "more now due to lack of exercise"   Edema    Fatty liver    GERD (gastroesophageal reflux disease)    Gout    Heart murmur    Hemiparesis affecting left side as late effect of cerebrovascular accident (HCC)    Hiatal hernia    a.) s/p repair   History of IBS    History of methicillin resistant staphylococcus aureus (MRSA) 2008   History of multiple strokes    Hyperlipidemia    Hypertension    Hypothyroidism    Insomnia    Long term current use of anticoagulant    a.) apixaban   Mitral regurgitation    a. 05/2021 Echo: Mild MR.   Nephrolithiasis    Neuropathy involving both lower extremities  Non-healing ulcer of left foot (HCC)    Obesity, Class II, BMI 35-39.9, with comorbidity    OSA on CPAP    PAF (paroxysmal atrial fibrillation) (HCC)    a.) 07/2013 Event monitor: Freq PVCs and 3 short runs of Afib; b.) CHA2DS2VASc = 8 (age, sex, CHF, HTN, CVA/DVT x2, vascular disease history, T2DM);  b.) rate/rhythm maintained without pharmacological intervention; chronically anticoagulated with apixaban   PAH (pulmonary artery hypertension) (HCC)    a. 05/2021 Echo: RVSP 54.75mmHg.   Peripheral vascular disease (HCC)    a. 03/2018 LE Angio: Nl renal arteries. Nl aorta & iliacs. Nl LLE arterial flow; b. 06/2020 ABI's: Nl  bilat.   T2DM (type 2 diabetes mellitus) (HCC)    Tricuspid regurgitation    a. 05/2021 Echo: Mod TR (in setting of RVSP 54.49mmHg).   Umbilical hernia    a.) s/p repair   Urinary incontinence    Venous stasis dermatitis of both lower extremities     Assessment/Plan:   4 Days Post-Op Procedure(s) (LRB): REVERSE SHOULDER ARTHROPLASTY (Left) Principal Problem:   S/p reverse total shoulder arthroplasty Active Problems:   S/P reverse total shoulder arthroplasty, right  Estimated body mass index is 45.83 kg/m as calculated from the following:   Height as of this encounter: 5\' 4"  (1.626 m).   Weight as of this encounter: 121.1 kg. Advance diet Up with therapy  Pain controlled  VSS  + BM  CM to assist with discharge to SNF. Stable for DC to SNF. [facility] unable to accept patient until Monday 6/17.  DVT Prophylaxis - TED hose, SCDs, Eliquis   T. Cranston Neighbor, PA-C [facility] 09/05/2022, 8:58 AM  Patient seen and examined, agree with above plan.  The patient is doing well status post left reverse total shoulder arthroplasty, no concerns at this time.  Pain is controlled.  Discussed pain medication use, and safe transition to [facility].  All questions answered the patient agrees with above plan will go.  Reinaldo Berber MD

## 2022-09-05 NOTE — Plan of Care (Signed)
  Problem: Education: Goal: Knowledge of the prescribed therapeutic regimen will improve Outcome: Progressing   Problem: Activity: Goal: Ability to tolerate increased activity will improve Outcome: Progressing   Problem: Pain Management: Goal: Pain level will decrease with appropriate interventions Outcome: Progressing   Problem: Education: Goal: Ability to describe self-care measures that may prevent or decrease complications (Diabetes Survival Skills Education) will improve Outcome: Progressing   Problem: Coping: Goal: Ability to adjust to condition or change in health will improve Outcome: Progressing

## 2022-09-06 LAB — GLUCOSE, CAPILLARY
Glucose-Capillary: 76 mg/dL (ref 70–99)
Glucose-Capillary: 93 mg/dL (ref 70–99)
Glucose-Capillary: 106 mg/dL — ABNORMAL HIGH (ref 70–99)
Glucose-Capillary: 128 mg/dL — ABNORMAL HIGH (ref 70–99)

## 2022-09-06 NOTE — Progress Notes (Addendum)
Subjective: 5 Days Post-Op Procedure(s) (LRB): REVERSE SHOULDER ARTHROPLASTY (Left) Patient reports pain as mild. Patient is well, and has had no acute complaints or problems Denies any CP, SOB, ABD pain. We will continue therapy today. Slow progress Plan is to go to [facility] Monday + BM  Objective: Vital signs in last 24 hours: Temp:  [97.5 F (36.4 C)-97.7 F (36.5 C)] 97.7 F (36.5 C) (06/16 0728) Pulse Rate:  [57-60] 60 (06/16 0728) Resp:  [16-17] 16 (06/16 0728) BP: (93-104)/(46-72) 101/72 (06/16 0728) SpO2:  [91 %-94 %] 94 % (06/16 0728) FiO2 (%):  [21 %] 21 % (06/15 2307)  Intake/Output from previous day: 06/15 0701 - 06/16 0700 In: 480 [P.O.:480] Out: 700 [Urine:700] Intake/Output this shift: No intake/output data recorded.  No results for input(s): "HGB" in the last 72 hours.  No results for input(s): "WBC", "RBC", "HCT", "PLT" in the last 72 hours.  No results for input(s): "NA", "K", "CL", "CO2", "BUN", "CREATININE", "GLUCOSE", "CALCIUM" in the last 72 hours.  No results for input(s): "LABPT", "INR" in the last 72 hours.  EXAM General - Patient is Alert, Appropriate, and Oriented Left Upper Extremity - Neurovascular intact Sensation intact distally Intact pulses distally No cellulitis present Compartment soft Full composite fist Dressing - scant drainage, No active drainage Motor Function - intact, moving foot and toes well on exam.   Past Medical History:  Diagnosis Date   Anemia    Anxiety    a.) on BZO (alprazolam) PRN   Arthritis    Asthma    Atypical chest pain    a. 03/2018 MV: No ischemia/infarct. EF >65%.   Carotid arterial disease (HCC)    a. 03/2020 U/S: RICA <50, LICA nl.   Chronic heart failure with preserved ejection fraction (HFpEF) (HCC)    a. 08/2013 Echo: EF 60-65%; b. 08/2019 Echo: EF 60-65%; c. 03/2020 Echo: EF 60-65%; d. 05/2021 Echo: EF 55-60%, no rmwa, nl RV fxn, RVSP 54.28mmHg. Mildly dil LA, mild MR, mod TR.   Chronic, continuous  use of opioids    a.) has naloxone Rx available   CKD (chronic kidney disease), stage III (HCC)    Complication of anesthesia    a.) RIGHT hemidiaphragm paralysis s/p interscalene block for shoulder arthroplasty in 2017 --> caused "permanent lung damage" per patient --> REFUSES further neuraxial anesthesia   Cystocele    Deep vein thrombosis (DVT) of right lower extremity (HCC)    Depression    Diaphragm paralysis 2018   a.) RIGHT hemidiaphragm paralysis s/p interscalene block for shoulder arthroplasty in 2017; refuses additional regional/neuraxial anesthesia   DM (diabetes mellitus) type II, controlled, with peripheral vascular disorder (HCC) 08/31/2014   Dyspnea    11/08/2018 - "more now due to lack of exercise"   Edema    Fatty liver    GERD (gastroesophageal reflux disease)    Gout    Heart murmur    Hemiparesis affecting left side as late effect of cerebrovascular accident (HCC)    Hiatal hernia    a.) s/p repair   History of IBS    History of methicillin resistant staphylococcus aureus (MRSA) 2008   History of multiple strokes    Hyperlipidemia    Hypertension    Hypothyroidism    Insomnia    Long term current use of anticoagulant    a.) apixaban   Mitral regurgitation    a. 05/2021 Echo: Mild MR.   Nephrolithiasis    Neuropathy involving both lower extremities  Non-healing ulcer of left foot (HCC)    Obesity, Class II, BMI 35-39.9, with comorbidity    OSA on CPAP    PAF (paroxysmal atrial fibrillation) (HCC)    a.) 07/2013 Event monitor: Freq PVCs and 3 short runs of Afib; b.) CHA2DS2VASc = 8 (age, sex, CHF, HTN, CVA/DVT x2, vascular disease history, T2DM);  b.) rate/rhythm maintained without pharmacological intervention; chronically anticoagulated with apixaban   PAH (pulmonary artery hypertension) (HCC)    a. 05/2021 Echo: RVSP 54.81mmHg.   Peripheral vascular disease (HCC)    a. 03/2018 LE Angio: Nl renal arteries. Nl aorta & iliacs. Nl LLE arterial flow; b. 06/2020  ABI's: Nl bilat.   T2DM (type 2 diabetes mellitus) (HCC)    Tricuspid regurgitation    a. 05/2021 Echo: Mod TR (in setting of RVSP 54.74mmHg).   Umbilical hernia    a.) s/p repair   Urinary incontinence    Venous stasis dermatitis of both lower extremities     Assessment/Plan:   5 Days Post-Op Procedure(s) (LRB): REVERSE SHOULDER ARTHROPLASTY (Left) Principal Problem:   S/p reverse total shoulder arthroplasty Active Problems:   S/P reverse total shoulder arthroplasty, right  Estimated body mass index is 45.83 kg/m as calculated from the following:   Height as of this encounter: 5\' 4"  (1.626 m).   Weight as of this encounter: 121.1 kg. Advance diet Up with therapy  Pain controlled  VSS  + BM  CM to assist with discharge to SNF. Stable for DC to SNF. [facility] unable to accept patient until Monday 6/17.  DVT Prophylaxis - TED hose, SCDs, Eliquis   T. Cranston Neighbor, PA-C [facility] 09/06/2022, 9:17 AM  Patient seen and examined, agree with above plan.  The patient is doing well status post left reverse total shoulder arthroplasty, no concerns at this time.  Pain is controlled.  Discussed pain medications, PT, and transition to SNF at [facility]. All questions answered.   Reinaldo Berber MD

## 2022-09-06 NOTE — Progress Notes (Signed)
Polar ice cooler has been refilled.

## 2022-09-06 NOTE — Progress Notes (Signed)
Physical Therapy Treatment Patient Details Name: [name] MRN: [id number] DOB: [birth year] Today's Date: 09/06/2022   History of Present Illness [name] is a 71yoF who comes to [facility] on 09/01/22 for elective Left TSA. Pt is known to our services from several prior admissions. PMH: DM, AF, CHF, LEE, recurrent UTI, recurrent skin yeast ABD/breast, multiple CVA c left hemiplegia.    PT Comments    Pt ready for session.  She is able to transition slowly to EOB with HOB raised and increased time and min a x 1 to get fully upright.  She is steady in sitting and given shoes to put on per her preference.  She stands x 2 with HW at bedside with bed elevated and mod a x 1.  On both attempts she tries to sidestep to transition to recliner but is unable to make any functional gains towards the chair.  She is limited by fatigue each time.  Returns to supine with mod a x 1 and positioned for comfort.  RN notified of request for pain meds. She will benefit from continued therapies at discharge to return to PLOF.   Recommendations for follow up therapy are one component of a multi-disciplinary discharge planning process, led by the attending physician.  Recommendations may be updated based on patient status, additional functional criteria and insurance authorization.  Follow Up Recommendations       Assistance Recommended at Discharge Frequent or constant Supervision/Assistance  Patient can return home with the following A lot of help with bathing/dressing/bathroom;Two people to help with walking and/or transfers;Help with stairs or ramp for entrance;Assist for transportation;Direct supervision/assist for financial management   Equipment Recommendations  None recommended by PT    Recommendations for Other Services       Precautions / Restrictions Precautions Precautions: Fall Required Braces or Orthoses: Sling Restrictions Weight Bearing Restrictions: Yes LUE Weight Bearing: Non weight  bearing Other Position/Activity Restrictions: L UE sling support     Mobility  Bed Mobility Overal bed mobility: Needs Assistance Bed Mobility: Supine to Sit, Sit to Supine     Supine to sit: Min assist Sit to supine: Mod assist   General bed mobility comments: increased time but good effort and abiltity to transition from supine with HOB raised and rail    Transfers Overall transfer level: Needs assistance Equipment used: Hemi-walker Transfers: Sit to/from Stand Sit to Stand: Mod assist, From elevated surface           General transfer comment: uanble to move her feet/step today to complete transition to recliner at bedside.    Ambulation/Gait         Gait velocity: decreased     General Gait Details: hesitant and unable to take any effective steps today with [facility]   Stairs             Wheelchair Mobility    Modified Rankin (Stroke Patients Only)       Balance Overall balance assessment: Needs assistance Sitting-balance support: Feet supported, No upper extremity supported Sitting balance-Leahy Scale: Good     Standing balance support: Single extremity supported, Reliant on assistive device for balance, During functional activity Standing balance-Leahy Scale: Poor Standing balance comment: increased standing time today but remains limited with forward lean on HW for support                            Cognition Arousal/Alertness: Awake/alert Behavior During Therapy: [facility] for tasks assessed/performed  Overall Cognitive Status: Within Functional Limits for tasks assessed                                          Exercises      General Comments        Pertinent Vitals/Pain Pain Assessment Pain Assessment: Faces Faces Pain Scale: Hurts whole lot Pain Location: L shoulder Pain Descriptors / Indicators: Aching, Other (Comment) Pain Intervention(s): Repositioned, Patient requesting pain meds-RN notified, Monitored  during session, Limited activity within patient's tolerance    Home Living                          Prior Function            PT Goals (current goals can now be found in the care plan section) Progress towards PT goals: Progressing toward goals    Frequency    Min 4X/week      PT Plan Current plan remains appropriate    Co-evaluation              AM-PAC PT "6 Clicks" Mobility   Outcome Measure  Help needed turning from your back to your side while in a flat bed without using bedrails?: A Little Help needed moving from lying on your back to sitting on the side of a flat bed without using bedrails?: A Little Help needed moving to and from a bed to a chair (including a wheelchair)?: A Lot Help needed standing up from a chair using your arms (e.g., wheelchair or bedside chair)?: A Lot Help needed to walk in hospital room?: Total Help needed climbing 3-5 steps with a railing? : Total 6 Click Score: 12    End of Session Equipment Utilized During Treatment: Gait belt Activity Tolerance: Patient tolerated treatment well Patient left: in bed;with call bell/phone within reach;with bed alarm set;with family/visitor present Nurse Communication: Mobility status;Weight bearing status PT Visit Diagnosis: Unsteadiness on feet (R26.81);Other abnormalities of gait and mobility (R26.89);Difficulty in walking, not elsewhere classified (R26.2);History of falling (Z91.81)     Time: 4098-1191 PT Time Calculation (min) (ACUTE ONLY): 25 min  Charges:  $Therapeutic Activity: 23-37 mins                   Danielle Dess, PTA 09/06/22, 1:25 PM

## 2022-09-07 DIAGNOSIS — E1122 Type 2 diabetes mellitus with diabetic chronic kidney disease: Secondary | ICD-10-CM | POA: Diagnosis not present

## 2022-09-07 DIAGNOSIS — L89629 Pressure ulcer of left heel, unspecified stage: Secondary | ICD-10-CM | POA: Diagnosis not present

## 2022-09-07 DIAGNOSIS — J449 Chronic obstructive pulmonary disease, unspecified: Secondary | ICD-10-CM | POA: Diagnosis not present

## 2022-09-07 DIAGNOSIS — Z96612 Presence of left artificial shoulder joint: Secondary | ICD-10-CM | POA: Diagnosis not present

## 2022-09-07 DIAGNOSIS — N1832 Chronic kidney disease, stage 3b: Secondary | ICD-10-CM | POA: Diagnosis not present

## 2022-09-07 DIAGNOSIS — F329 Major depressive disorder, single episode, unspecified: Secondary | ICD-10-CM | POA: Diagnosis not present

## 2022-09-07 DIAGNOSIS — G4700 Insomnia, unspecified: Secondary | ICD-10-CM | POA: Diagnosis not present

## 2022-09-07 DIAGNOSIS — K5900 Constipation, unspecified: Secondary | ICD-10-CM | POA: Diagnosis not present

## 2022-09-07 DIAGNOSIS — Z471 Aftercare following joint replacement surgery: Secondary | ICD-10-CM | POA: Diagnosis not present

## 2022-09-07 DIAGNOSIS — Z7401 Bed confinement status: Secondary | ICD-10-CM | POA: Diagnosis not present

## 2022-09-07 DIAGNOSIS — E039 Hypothyroidism, unspecified: Secondary | ICD-10-CM | POA: Diagnosis not present

## 2022-09-07 DIAGNOSIS — Z8673 Personal history of transient ischemic attack (TIA), and cerebral infarction without residual deficits: Secondary | ICD-10-CM | POA: Diagnosis not present

## 2022-09-07 DIAGNOSIS — M797 Fibromyalgia: Secondary | ICD-10-CM | POA: Diagnosis not present

## 2022-09-07 DIAGNOSIS — Z743 Need for continuous supervision: Secondary | ICD-10-CM | POA: Diagnosis not present

## 2022-09-07 DIAGNOSIS — Z7984 Long term (current) use of oral hypoglycemic drugs: Secondary | ICD-10-CM | POA: Diagnosis not present

## 2022-09-07 DIAGNOSIS — I4891 Unspecified atrial fibrillation: Secondary | ICD-10-CM | POA: Diagnosis not present

## 2022-09-07 DIAGNOSIS — E785 Hyperlipidemia, unspecified: Secondary | ICD-10-CM | POA: Diagnosis not present

## 2022-09-07 DIAGNOSIS — M1990 Unspecified osteoarthritis, unspecified site: Secondary | ICD-10-CM | POA: Diagnosis not present

## 2022-09-07 DIAGNOSIS — F419 Anxiety disorder, unspecified: Secondary | ICD-10-CM | POA: Diagnosis not present

## 2022-09-07 DIAGNOSIS — I10 Essential (primary) hypertension: Secondary | ICD-10-CM | POA: Diagnosis not present

## 2022-09-07 DIAGNOSIS — M6281 Muscle weakness (generalized): Secondary | ICD-10-CM | POA: Diagnosis not present

## 2022-09-07 DIAGNOSIS — G4733 Obstructive sleep apnea (adult) (pediatric): Secondary | ICD-10-CM | POA: Diagnosis not present

## 2022-09-07 DIAGNOSIS — F32A Depression, unspecified: Secondary | ICD-10-CM | POA: Diagnosis not present

## 2022-09-07 DIAGNOSIS — R011 Cardiac murmur, unspecified: Secondary | ICD-10-CM | POA: Diagnosis not present

## 2022-09-07 DIAGNOSIS — M109 Gout, unspecified: Secondary | ICD-10-CM | POA: Diagnosis not present

## 2022-09-07 DIAGNOSIS — M19012 Primary osteoarthritis, left shoulder: Secondary | ICD-10-CM | POA: Diagnosis not present

## 2022-09-07 DIAGNOSIS — J45909 Unspecified asthma, uncomplicated: Secondary | ICD-10-CM | POA: Diagnosis not present

## 2022-09-07 DIAGNOSIS — D519 Vitamin B12 deficiency anemia, unspecified: Secondary | ICD-10-CM | POA: Diagnosis not present

## 2022-09-07 DIAGNOSIS — R69 Illness, unspecified: Secondary | ICD-10-CM | POA: Diagnosis not present

## 2022-09-07 DIAGNOSIS — M25512 Pain in left shoulder: Secondary | ICD-10-CM | POA: Diagnosis not present

## 2022-09-07 DIAGNOSIS — R0681 Apnea, not elsewhere classified: Secondary | ICD-10-CM | POA: Diagnosis not present

## 2022-09-07 DIAGNOSIS — I69398 Other sequelae of cerebral infarction: Secondary | ICD-10-CM | POA: Diagnosis not present

## 2022-09-07 DIAGNOSIS — K589 Irritable bowel syndrome without diarrhea: Secondary | ICD-10-CM | POA: Diagnosis not present

## 2022-09-07 DIAGNOSIS — E538 Deficiency of other specified B group vitamins: Secondary | ICD-10-CM | POA: Diagnosis not present

## 2022-09-07 DIAGNOSIS — Z7901 Long term (current) use of anticoagulants: Secondary | ICD-10-CM | POA: Diagnosis not present

## 2022-09-07 DIAGNOSIS — N390 Urinary tract infection, site not specified: Secondary | ICD-10-CM | POA: Diagnosis not present

## 2022-09-07 DIAGNOSIS — E1151 Type 2 diabetes mellitus with diabetic peripheral angiopathy without gangrene: Secondary | ICD-10-CM | POA: Diagnosis not present

## 2022-09-07 DIAGNOSIS — I480 Paroxysmal atrial fibrillation: Secondary | ICD-10-CM | POA: Diagnosis not present

## 2022-09-07 DIAGNOSIS — I129 Hypertensive chronic kidney disease with stage 1 through stage 4 chronic kidney disease, or unspecified chronic kidney disease: Secondary | ICD-10-CM | POA: Diagnosis not present

## 2022-09-07 DIAGNOSIS — E1140 Type 2 diabetes mellitus with diabetic neuropathy, unspecified: Secondary | ICD-10-CM | POA: Diagnosis not present

## 2022-09-07 DIAGNOSIS — Z96611 Presence of right artificial shoulder joint: Secondary | ICD-10-CM | POA: Diagnosis not present

## 2022-09-07 DIAGNOSIS — K219 Gastro-esophageal reflux disease without esophagitis: Secondary | ICD-10-CM | POA: Diagnosis not present

## 2022-09-07 LAB — GLUCOSE, CAPILLARY: Glucose-Capillary: 80 mg/dL (ref 70–99)

## 2022-09-07 NOTE — Care Management Important Message (Signed)
Important Message  Patient Details  Name: TERRIN GAIR MRN: [id number] Date of Birth: 06-03-1950   Medicare Important Message Given:  Yes     Olegario Messier A Jilberto Vanderwall 09/07/2022, 10:52 AM

## 2022-09-07 NOTE — Progress Notes (Signed)
EMS transported pt from [facility] to [facility]. 

## 2022-09-07 NOTE — Consult Note (Signed)
[facility] Customer service manager [facility]) Accountable Care Organization (ACO) [facility] Liaison Note  09/07/2022  [name] [birth year] [id number]  Location: [facility] RN Hospital Liaison screened the patient remotely at [facility].  Insurance: [name] is a 72 y.o. adult who is a Primary Care Patient of Dawn Dicker, NP. The patient was screened for readmission hospitalization with noted high risk score for unplanned readmission risk with 1 IP in 6 months.  The patient was assessed for potential [facility]) Care Management service needs for post hospital transition for care coordination. Review of patient's electronic medical record reveals patient was admitted DM2 s/p reverse total shoulder arthroplasty. Pt will continue ongoing therapy at SNF. Will collaborate with [facility] PAC-RN for ongoing management needs.    [facility] Care Management/Population Health does not replace or interfere with any arrangements made by the Inpatient Transition of Care team.   For questions contact:   Dawn Cousin, RN, BSN [facility] Liaison [place]   [facility]  Population Health Office Hours MTWF  8:00 am-6:00 pm Off on Thursday [phone] mobile [phone] [Office toll free line][facility] Office Hours are M-F 8:30 - 5 pm 24 hour nurse advise line [phone] Concierge  [name]

## 2022-09-07 NOTE — TOC Progression Note (Signed)
Transition of Care [facility]) - Progression Note    Patient Details  Name: [name] MRN: [id number] Date of Birth: 06-04-1950  Transition of Care [facility]) CM/SW Contact  Marlowe Sax, RN Phone Number: 09/07/2022, 11:21 AM  Clinical Narrative:     The patient is discharging to [facility] [phone] EMS has been called and will transport, she is next on list for transport, her family is in the room and aware  Expected Discharge Plan: Home w Home Health Services Barriers to Discharge: Continued Medical Work up  Expected Discharge Plan and Services   Discharge Planning Services: CM Consult   Living arrangements for the past 2 months: Single Family Home Expected Discharge Date: 09/07/22               DME Arranged:  (TBD)                     Social Determinants of Health (SDOH) Interventions SDOH Screenings   Food Insecurity: No Food Insecurity (09/01/2022)  Housing: Low Risk  (09/01/2022)  Transportation Needs: No Transportation Needs (09/01/2022)  Utilities: Not At Risk (09/01/2022)  Depression (PHQ2-9): Medium Risk (06/12/2022)  Financial Resource Strain: Low Risk  (05/08/2020)  Tobacco Use: Low Risk  (09/02/2022)    Readmission Risk Interventions     No data to display

## 2022-09-07 NOTE — Care Management Important Message (Signed)
Important Message  Patient Details  Name: [name] MRN: [id number] Date of Birth: [birth year]   Medicare Important Message Given:  Yes     Olegario Messier A Maytal Mijangos 09/07/2022, 10:42 AM

## 2022-09-07 NOTE — Progress Notes (Signed)
Subjective: 6 Days Post-Op Procedure(s) (LRB): REVERSE SHOULDER ARTHROPLASTY (Left) Patient reports pain as mild. Patient is well, and has had no acute complaints or problems Denies any CP, SOB, ABD pain. We will continue therapy today. Slow progress Plan is to go to SNF today + BM  Objective: Vital signs in last 24 hours: Temp:  [97.6 F (36.4 C)-97.7 F (36.5 C)] 97.6 F (36.4 C) (06/16 1634) Pulse Rate:  [60-86] 86 (06/16 1634) Resp:  [16] 16 (06/16 1634) BP: (101-107)/(62-72) 107/62 (06/16 1634) SpO2:  [80 %-94 %] 80 % (06/16 1634) FiO2 (%):  [21 %] 21 % (06/17 0138)  Intake/Output from previous day: 06/16 0701 - 06/17 0700 In: 240 [P.O.:240] Out: 1900 [Urine:1900] Intake/Output this shift: No intake/output data recorded.  No results for input(s): "HGB" in the last 72 hours.  No results for input(s): "WBC", "RBC", "HCT", "PLT" in the last 72 hours.  No results for input(s): "NA", "K", "CL", "CO2", "BUN", "CREATININE", "GLUCOSE", "CALCIUM" in the last 72 hours.  No results for input(s): "LABPT", "INR" in the last 72 hours.  EXAM General - Patient is Alert, Appropriate, and Oriented Left Upper Extremity - Neurovascular intact Sensation intact distally Intact pulses distally No cellulitis present Compartment soft Full composite fist Dressing - scant drainage, No active drainage Motor Function - intact, moving foot and toes well on exam.   Past Medical History:  Diagnosis Date   Anemia    Anxiety    a.) on BZO (alprazolam) PRN   Arthritis    Asthma    Atypical chest pain    a. 03/2018 MV: No ischemia/infarct. EF >65%.   Carotid arterial disease (HCC)    a. 03/2020 U/S: RICA <50, LICA nl.   Chronic heart failure with preserved ejection fraction (HFpEF) (HCC)    a. 08/2013 Echo: EF 60-65%; b. 08/2019 Echo: EF 60-65%; c. 03/2020 Echo: EF 60-65%; d. 05/2021 Echo: EF 55-60%, no rmwa, nl RV fxn, RVSP 54.27mmHg. Mildly dil LA, mild MR, mod TR.   Chronic, continuous  use of opioids    a.) has naloxone Rx available   CKD (chronic kidney disease), stage III (HCC)    Complication of anesthesia    a.) RIGHT hemidiaphragm paralysis s/p interscalene block for shoulder arthroplasty in 2017 --> caused "permanent lung damage" per patient --> REFUSES further neuraxial anesthesia   Cystocele    Deep vein thrombosis (DVT) of right lower extremity (HCC)    Depression    Diaphragm paralysis 2018   a.) RIGHT hemidiaphragm paralysis s/p interscalene block for shoulder arthroplasty in 2017; refuses additional regional/neuraxial anesthesia   DM (diabetes mellitus) type II, controlled, with peripheral vascular disorder (HCC) 08/31/2014   Dyspnea    11/08/2018 - "more now due to lack of exercise"   Edema    Fatty liver    GERD (gastroesophageal reflux disease)    Gout    Heart murmur    Hemiparesis affecting left side as late effect of cerebrovascular accident (HCC)    Hiatal hernia    a.) s/p repair   History of IBS    History of methicillin resistant staphylococcus aureus (MRSA) 2008   History of multiple strokes    Hyperlipidemia    Hypertension    Hypothyroidism    Insomnia    Long term current use of anticoagulant    a.) apixaban   Mitral regurgitation    a. 05/2021 Echo: Mild MR.   Nephrolithiasis    Neuropathy involving both lower extremities  Non-healing ulcer of left foot (HCC)    Obesity, Class II, BMI 35-39.9, with comorbidity    OSA on CPAP    PAF (paroxysmal atrial fibrillation) (HCC)    a.) 07/2013 Event monitor: Freq PVCs and 3 short runs of Afib; b.) CHA2DS2VASc = 8 (age, sex, CHF, HTN, CVA/DVT x2, vascular disease history, T2DM);  b.) rate/rhythm maintained without pharmacological intervention; chronically anticoagulated with apixaban   PAH (pulmonary artery hypertension) (HCC)    a. 05/2021 Echo: RVSP 54.47mmHg.   Peripheral vascular disease (HCC)    a. 03/2018 LE Angio: Nl renal arteries. Nl aorta & iliacs. Nl LLE arterial flow; b. 06/2020  ABI's: Nl bilat.   T2DM (type 2 diabetes mellitus) (HCC)    Tricuspid regurgitation    a. 05/2021 Echo: Mod TR (in setting of RVSP 54.98mmHg).   Umbilical hernia    a.) s/p repair   Urinary incontinence    Venous stasis dermatitis of both lower extremities     Assessment/Plan:   6 Days Post-Op Procedure(s) (LRB): REVERSE SHOULDER ARTHROPLASTY (Left) Principal Problem:   S/p reverse total shoulder arthroplasty Active Problems:   S/P reverse total shoulder arthroplasty, right  Estimated body mass index is 45.83 kg/m as calculated from the following:   Height as of this encounter: 5\' 4"  (1.626 m).   Weight as of this encounter: 121.1 kg. Advance diet Up with therapy limited ambulation.  Pain controlled  VSS  + BM  CM to assist with discharge to SNF. Stable for DC to SNF. [facility] to except her today.  DVT Prophylaxis - TED hose, SCDs, Eliquis   Dedra Skeens, PA-C [facility] 09/07/2022, 6:16 AM

## 2022-09-07 NOTE — Progress Notes (Signed)
Contact and reported to nurse at [facility].

## 2022-09-08 ENCOUNTER — Ambulatory Visit: Payer: Medicare HMO | Admitting: Nurse Practitioner

## 2022-09-08 DIAGNOSIS — Z471 Aftercare following joint replacement surgery: Secondary | ICD-10-CM | POA: Diagnosis not present

## 2022-09-08 DIAGNOSIS — J449 Chronic obstructive pulmonary disease, unspecified: Secondary | ICD-10-CM | POA: Diagnosis not present

## 2022-09-08 DIAGNOSIS — I4891 Unspecified atrial fibrillation: Secondary | ICD-10-CM | POA: Diagnosis not present

## 2022-09-08 DIAGNOSIS — E1140 Type 2 diabetes mellitus with diabetic neuropathy, unspecified: Secondary | ICD-10-CM | POA: Diagnosis not present

## 2022-09-08 DIAGNOSIS — I129 Hypertensive chronic kidney disease with stage 1 through stage 4 chronic kidney disease, or unspecified chronic kidney disease: Secondary | ICD-10-CM | POA: Diagnosis not present

## 2022-09-08 DIAGNOSIS — E785 Hyperlipidemia, unspecified: Secondary | ICD-10-CM | POA: Diagnosis not present

## 2022-09-08 DIAGNOSIS — I10 Essential (primary) hypertension: Secondary | ICD-10-CM | POA: Diagnosis not present

## 2022-09-08 DIAGNOSIS — M109 Gout, unspecified: Secondary | ICD-10-CM | POA: Diagnosis not present

## 2022-09-08 DIAGNOSIS — K5900 Constipation, unspecified: Secondary | ICD-10-CM | POA: Diagnosis not present

## 2022-09-08 DIAGNOSIS — M19012 Primary osteoarthritis, left shoulder: Secondary | ICD-10-CM | POA: Diagnosis not present

## 2022-09-09 ENCOUNTER — Ambulatory Visit: Payer: Medicare HMO | Admitting: Nurse Practitioner

## 2022-09-10 DIAGNOSIS — N390 Urinary tract infection, site not specified: Secondary | ICD-10-CM | POA: Diagnosis not present

## 2022-09-11 ENCOUNTER — Ambulatory Visit: Payer: Medicare HMO | Admitting: Nurse Practitioner

## 2022-09-11 DIAGNOSIS — E538 Deficiency of other specified B group vitamins: Secondary | ICD-10-CM | POA: Diagnosis not present

## 2022-09-11 DIAGNOSIS — J449 Chronic obstructive pulmonary disease, unspecified: Secondary | ICD-10-CM | POA: Diagnosis not present

## 2022-09-11 DIAGNOSIS — K5900 Constipation, unspecified: Secondary | ICD-10-CM | POA: Diagnosis not present

## 2022-09-11 DIAGNOSIS — N390 Urinary tract infection, site not specified: Secondary | ICD-10-CM | POA: Diagnosis not present

## 2022-09-11 DIAGNOSIS — G4733 Obstructive sleep apnea (adult) (pediatric): Secondary | ICD-10-CM | POA: Diagnosis not present

## 2022-09-11 DIAGNOSIS — F419 Anxiety disorder, unspecified: Secondary | ICD-10-CM | POA: Diagnosis not present

## 2022-09-11 DIAGNOSIS — Z7984 Long term (current) use of oral hypoglycemic drugs: Secondary | ICD-10-CM | POA: Diagnosis not present

## 2022-09-11 DIAGNOSIS — F32A Depression, unspecified: Secondary | ICD-10-CM | POA: Diagnosis not present

## 2022-09-11 DIAGNOSIS — R0681 Apnea, not elsewhere classified: Secondary | ICD-10-CM | POA: Diagnosis not present

## 2022-09-11 DIAGNOSIS — I129 Hypertensive chronic kidney disease with stage 1 through stage 4 chronic kidney disease, or unspecified chronic kidney disease: Secondary | ICD-10-CM | POA: Diagnosis not present

## 2022-09-11 DIAGNOSIS — G4700 Insomnia, unspecified: Secondary | ICD-10-CM | POA: Diagnosis not present

## 2022-09-11 DIAGNOSIS — Z471 Aftercare following joint replacement surgery: Secondary | ICD-10-CM | POA: Diagnosis not present

## 2022-09-11 DIAGNOSIS — Z8673 Personal history of transient ischemic attack (TIA), and cerebral infarction without residual deficits: Secondary | ICD-10-CM | POA: Diagnosis not present

## 2022-09-11 DIAGNOSIS — E1140 Type 2 diabetes mellitus with diabetic neuropathy, unspecified: Secondary | ICD-10-CM | POA: Diagnosis not present

## 2022-09-11 DIAGNOSIS — F329 Major depressive disorder, single episode, unspecified: Secondary | ICD-10-CM | POA: Diagnosis not present

## 2022-09-11 DIAGNOSIS — J45909 Unspecified asthma, uncomplicated: Secondary | ICD-10-CM | POA: Diagnosis not present

## 2022-09-11 DIAGNOSIS — I480 Paroxysmal atrial fibrillation: Secondary | ICD-10-CM | POA: Diagnosis not present

## 2022-09-11 DIAGNOSIS — I10 Essential (primary) hypertension: Secondary | ICD-10-CM | POA: Diagnosis not present

## 2022-09-14 DIAGNOSIS — Z471 Aftercare following joint replacement surgery: Secondary | ICD-10-CM | POA: Diagnosis not present

## 2022-09-14 DIAGNOSIS — F329 Major depressive disorder, single episode, unspecified: Secondary | ICD-10-CM | POA: Diagnosis not present

## 2022-09-14 DIAGNOSIS — J449 Chronic obstructive pulmonary disease, unspecified: Secondary | ICD-10-CM | POA: Diagnosis not present

## 2022-09-14 DIAGNOSIS — I129 Hypertensive chronic kidney disease with stage 1 through stage 4 chronic kidney disease, or unspecified chronic kidney disease: Secondary | ICD-10-CM | POA: Diagnosis not present

## 2022-09-14 DIAGNOSIS — M25512 Pain in left shoulder: Secondary | ICD-10-CM | POA: Diagnosis not present

## 2022-09-14 DIAGNOSIS — E1140 Type 2 diabetes mellitus with diabetic neuropathy, unspecified: Secondary | ICD-10-CM | POA: Diagnosis not present

## 2022-09-14 DIAGNOSIS — M19012 Primary osteoarthritis, left shoulder: Secondary | ICD-10-CM | POA: Diagnosis not present

## 2022-09-14 DIAGNOSIS — K219 Gastro-esophageal reflux disease without esophagitis: Secondary | ICD-10-CM | POA: Diagnosis not present

## 2022-09-14 DIAGNOSIS — N390 Urinary tract infection, site not specified: Secondary | ICD-10-CM | POA: Diagnosis not present

## 2022-09-14 DIAGNOSIS — Z7984 Long term (current) use of oral hypoglycemic drugs: Secondary | ICD-10-CM | POA: Diagnosis not present

## 2022-09-16 DIAGNOSIS — M19012 Primary osteoarthritis, left shoulder: Secondary | ICD-10-CM | POA: Diagnosis not present

## 2022-09-16 DIAGNOSIS — G4700 Insomnia, unspecified: Secondary | ICD-10-CM | POA: Diagnosis not present

## 2022-09-16 DIAGNOSIS — I129 Hypertensive chronic kidney disease with stage 1 through stage 4 chronic kidney disease, or unspecified chronic kidney disease: Secondary | ICD-10-CM | POA: Diagnosis not present

## 2022-09-16 DIAGNOSIS — J449 Chronic obstructive pulmonary disease, unspecified: Secondary | ICD-10-CM | POA: Diagnosis not present

## 2022-09-16 DIAGNOSIS — Z96612 Presence of left artificial shoulder joint: Secondary | ICD-10-CM | POA: Diagnosis not present

## 2022-09-16 DIAGNOSIS — F329 Major depressive disorder, single episode, unspecified: Secondary | ICD-10-CM | POA: Diagnosis not present

## 2022-09-16 DIAGNOSIS — M25512 Pain in left shoulder: Secondary | ICD-10-CM | POA: Diagnosis not present

## 2022-09-16 DIAGNOSIS — Z7984 Long term (current) use of oral hypoglycemic drugs: Secondary | ICD-10-CM | POA: Diagnosis not present

## 2022-09-16 DIAGNOSIS — I480 Paroxysmal atrial fibrillation: Secondary | ICD-10-CM | POA: Diagnosis not present

## 2022-09-16 DIAGNOSIS — N390 Urinary tract infection, site not specified: Secondary | ICD-10-CM | POA: Diagnosis not present

## 2022-09-16 DIAGNOSIS — Z471 Aftercare following joint replacement surgery: Secondary | ICD-10-CM | POA: Diagnosis not present

## 2022-09-16 DIAGNOSIS — E1140 Type 2 diabetes mellitus with diabetic neuropathy, unspecified: Secondary | ICD-10-CM | POA: Diagnosis not present

## 2022-09-20 DIAGNOSIS — D519 Vitamin B12 deficiency anemia, unspecified: Secondary | ICD-10-CM | POA: Diagnosis not present

## 2022-09-20 DIAGNOSIS — I10 Essential (primary) hypertension: Secondary | ICD-10-CM | POA: Diagnosis not present

## 2022-09-21 DIAGNOSIS — E1140 Type 2 diabetes mellitus with diabetic neuropathy, unspecified: Secondary | ICD-10-CM | POA: Diagnosis not present

## 2022-09-21 DIAGNOSIS — Z7984 Long term (current) use of oral hypoglycemic drugs: Secondary | ICD-10-CM | POA: Diagnosis not present

## 2022-09-21 DIAGNOSIS — G4700 Insomnia, unspecified: Secondary | ICD-10-CM | POA: Diagnosis not present

## 2022-09-21 DIAGNOSIS — E039 Hypothyroidism, unspecified: Secondary | ICD-10-CM | POA: Diagnosis not present

## 2022-09-21 DIAGNOSIS — L89629 Pressure ulcer of left heel, unspecified stage: Secondary | ICD-10-CM | POA: Diagnosis not present

## 2022-09-21 DIAGNOSIS — J449 Chronic obstructive pulmonary disease, unspecified: Secondary | ICD-10-CM | POA: Diagnosis not present

## 2022-09-21 DIAGNOSIS — I129 Hypertensive chronic kidney disease with stage 1 through stage 4 chronic kidney disease, or unspecified chronic kidney disease: Secondary | ICD-10-CM | POA: Diagnosis not present

## 2022-09-21 DIAGNOSIS — I480 Paroxysmal atrial fibrillation: Secondary | ICD-10-CM | POA: Diagnosis not present

## 2022-09-21 DIAGNOSIS — M25512 Pain in left shoulder: Secondary | ICD-10-CM | POA: Diagnosis not present

## 2022-09-21 DIAGNOSIS — Z471 Aftercare following joint replacement surgery: Secondary | ICD-10-CM | POA: Diagnosis not present

## 2022-09-23 ENCOUNTER — Ambulatory Visit: Payer: Medicare HMO | Admitting: Pulmonary Disease

## 2022-09-23 DIAGNOSIS — I69398 Other sequelae of cerebral infarction: Secondary | ICD-10-CM | POA: Diagnosis not present

## 2022-09-23 DIAGNOSIS — Z471 Aftercare following joint replacement surgery: Secondary | ICD-10-CM | POA: Diagnosis not present

## 2022-09-23 DIAGNOSIS — Z7984 Long term (current) use of oral hypoglycemic drugs: Secondary | ICD-10-CM | POA: Diagnosis not present

## 2022-09-23 DIAGNOSIS — J45909 Unspecified asthma, uncomplicated: Secondary | ICD-10-CM | POA: Diagnosis not present

## 2022-09-23 DIAGNOSIS — G4700 Insomnia, unspecified: Secondary | ICD-10-CM | POA: Diagnosis not present

## 2022-09-23 DIAGNOSIS — M25512 Pain in left shoulder: Secondary | ICD-10-CM | POA: Diagnosis not present

## 2022-09-23 DIAGNOSIS — M6281 Muscle weakness (generalized): Secondary | ICD-10-CM | POA: Diagnosis not present

## 2022-09-23 DIAGNOSIS — I129 Hypertensive chronic kidney disease with stage 1 through stage 4 chronic kidney disease, or unspecified chronic kidney disease: Secondary | ICD-10-CM | POA: Diagnosis not present

## 2022-09-23 DIAGNOSIS — J449 Chronic obstructive pulmonary disease, unspecified: Secondary | ICD-10-CM | POA: Diagnosis not present

## 2022-09-23 DIAGNOSIS — I480 Paroxysmal atrial fibrillation: Secondary | ICD-10-CM | POA: Diagnosis not present

## 2022-09-23 DIAGNOSIS — E039 Hypothyroidism, unspecified: Secondary | ICD-10-CM | POA: Diagnosis not present

## 2022-09-23 DIAGNOSIS — M797 Fibromyalgia: Secondary | ICD-10-CM | POA: Diagnosis not present

## 2022-09-23 DIAGNOSIS — E1140 Type 2 diabetes mellitus with diabetic neuropathy, unspecified: Secondary | ICD-10-CM | POA: Diagnosis not present

## 2022-09-23 DIAGNOSIS — L89629 Pressure ulcer of left heel, unspecified stage: Secondary | ICD-10-CM | POA: Diagnosis not present

## 2022-09-25 ENCOUNTER — Telehealth: Payer: Self-pay | Admitting: Cardiovascular Disease

## 2022-09-25 NOTE — Telephone Encounter (Signed)
Called patient, advised that the doctor at rehab changed her dosage of Lasix, she was taking 40 mg daily, he decreased it to 20 mg daily due to showing no swelling. She denies any shortness of breath, or swelling at this time, she has not weighed but we did discuss to start to weigh daily and monitor and to call us with any concerns.   Will route to MD to clarify if okay to decrease Lasix to 20 mg.   Patient verbalized understanding.

## 2022-09-25 NOTE — Telephone Encounter (Signed)
Pt c/o medication issue:  1. Name of Medication: furosemide (LASIX) 20 MG tablet   2. How are you currently taking this medication (dosage and times per day)? 1 tablet daily  3. Are you having a reaction (difficulty breathing--STAT)? no  4. What is your medication issue? Patient states she was at [facility] rehab and they changed her dosage of Lasix from 40mg  to 20mg , because they said she had "no fluid".  She wants to know if she should continue taking the 20mg  or go back to the 40mg . Please advise.

## 2022-09-28 ENCOUNTER — Telehealth: Payer: Self-pay | Admitting: Nurse Practitioner

## 2022-09-28 DIAGNOSIS — F419 Anxiety disorder, unspecified: Secondary | ICD-10-CM | POA: Diagnosis not present

## 2022-09-28 DIAGNOSIS — Z7951 Long term (current) use of inhaled steroids: Secondary | ICD-10-CM | POA: Diagnosis not present

## 2022-09-28 DIAGNOSIS — E785 Hyperlipidemia, unspecified: Secondary | ICD-10-CM | POA: Diagnosis not present

## 2022-09-28 DIAGNOSIS — Z7901 Long term (current) use of anticoagulants: Secondary | ICD-10-CM | POA: Diagnosis not present

## 2022-09-28 DIAGNOSIS — F32A Depression, unspecified: Secondary | ICD-10-CM | POA: Diagnosis not present

## 2022-09-28 DIAGNOSIS — M109 Gout, unspecified: Secondary | ICD-10-CM | POA: Diagnosis not present

## 2022-09-28 DIAGNOSIS — R41841 Cognitive communication deficit: Secondary | ICD-10-CM | POA: Diagnosis not present

## 2022-09-28 DIAGNOSIS — M797 Fibromyalgia: Secondary | ICD-10-CM | POA: Diagnosis not present

## 2022-09-28 DIAGNOSIS — J4489 Other specified chronic obstructive pulmonary disease: Secondary | ICD-10-CM | POA: Diagnosis not present

## 2022-09-28 DIAGNOSIS — L89610 Pressure ulcer of right heel, unstageable: Secondary | ICD-10-CM | POA: Diagnosis not present

## 2022-09-28 DIAGNOSIS — E039 Hypothyroidism, unspecified: Secondary | ICD-10-CM | POA: Diagnosis not present

## 2022-09-28 DIAGNOSIS — E538 Deficiency of other specified B group vitamins: Secondary | ICD-10-CM | POA: Diagnosis not present

## 2022-09-28 DIAGNOSIS — Z792 Long term (current) use of antibiotics: Secondary | ICD-10-CM | POA: Diagnosis not present

## 2022-09-28 DIAGNOSIS — G4733 Obstructive sleep apnea (adult) (pediatric): Secondary | ICD-10-CM | POA: Diagnosis not present

## 2022-09-28 DIAGNOSIS — Z471 Aftercare following joint replacement surgery: Secondary | ICD-10-CM | POA: Diagnosis not present

## 2022-09-28 DIAGNOSIS — Z7984 Long term (current) use of oral hypoglycemic drugs: Secondary | ICD-10-CM | POA: Diagnosis not present

## 2022-09-28 DIAGNOSIS — Z96612 Presence of left artificial shoulder joint: Secondary | ICD-10-CM | POA: Diagnosis not present

## 2022-09-28 DIAGNOSIS — I10 Essential (primary) hypertension: Secondary | ICD-10-CM | POA: Diagnosis not present

## 2022-09-28 DIAGNOSIS — K219 Gastro-esophageal reflux disease without esophagitis: Secondary | ICD-10-CM | POA: Diagnosis not present

## 2022-09-28 DIAGNOSIS — K5909 Other constipation: Secondary | ICD-10-CM | POA: Diagnosis not present

## 2022-09-28 DIAGNOSIS — L89620 Pressure ulcer of left heel, unstageable: Secondary | ICD-10-CM | POA: Diagnosis not present

## 2022-09-28 DIAGNOSIS — N390 Urinary tract infection, site not specified: Secondary | ICD-10-CM | POA: Diagnosis not present

## 2022-09-28 DIAGNOSIS — E1151 Type 2 diabetes mellitus with diabetic peripheral angiopathy without gangrene: Secondary | ICD-10-CM | POA: Diagnosis not present

## 2022-09-28 DIAGNOSIS — I4891 Unspecified atrial fibrillation: Secondary | ICD-10-CM | POA: Diagnosis not present

## 2022-09-28 DIAGNOSIS — D509 Iron deficiency anemia, unspecified: Secondary | ICD-10-CM | POA: Diagnosis not present

## 2022-09-28 NOTE — Telephone Encounter (Signed)
Soni from [facility] called in stating that pt needs PT orders for 1 time a week for 7 weeks. Any questions, she's available @ [phone].

## 2022-09-28 NOTE — Telephone Encounter (Signed)
Lasix 20 mg once daily is fine.  She should monitor for any signs of fluid overload such as lower extremity edema or increase in her weight.

## 2022-09-29 ENCOUNTER — Other Ambulatory Visit: Payer: Self-pay | Admitting: Nurse Practitioner

## 2022-09-29 DIAGNOSIS — J4489 Other specified chronic obstructive pulmonary disease: Secondary | ICD-10-CM | POA: Diagnosis not present

## 2022-09-29 DIAGNOSIS — M797 Fibromyalgia: Secondary | ICD-10-CM | POA: Diagnosis not present

## 2022-09-29 DIAGNOSIS — F419 Anxiety disorder, unspecified: Secondary | ICD-10-CM | POA: Diagnosis not present

## 2022-09-29 DIAGNOSIS — Z96612 Presence of left artificial shoulder joint: Secondary | ICD-10-CM

## 2022-09-29 DIAGNOSIS — M109 Gout, unspecified: Secondary | ICD-10-CM | POA: Diagnosis not present

## 2022-09-29 DIAGNOSIS — D509 Iron deficiency anemia, unspecified: Secondary | ICD-10-CM | POA: Diagnosis not present

## 2022-09-29 DIAGNOSIS — Z792 Long term (current) use of antibiotics: Secondary | ICD-10-CM | POA: Diagnosis not present

## 2022-09-29 DIAGNOSIS — I10 Essential (primary) hypertension: Secondary | ICD-10-CM | POA: Diagnosis not present

## 2022-09-29 DIAGNOSIS — G4733 Obstructive sleep apnea (adult) (pediatric): Secondary | ICD-10-CM | POA: Diagnosis not present

## 2022-09-29 DIAGNOSIS — N390 Urinary tract infection, site not specified: Secondary | ICD-10-CM | POA: Diagnosis not present

## 2022-09-29 DIAGNOSIS — Z7901 Long term (current) use of anticoagulants: Secondary | ICD-10-CM | POA: Diagnosis not present

## 2022-09-29 DIAGNOSIS — E538 Deficiency of other specified B group vitamins: Secondary | ICD-10-CM | POA: Diagnosis not present

## 2022-09-29 DIAGNOSIS — L89620 Pressure ulcer of left heel, unstageable: Secondary | ICD-10-CM | POA: Diagnosis not present

## 2022-09-29 DIAGNOSIS — E1151 Type 2 diabetes mellitus with diabetic peripheral angiopathy without gangrene: Secondary | ICD-10-CM | POA: Diagnosis not present

## 2022-09-29 DIAGNOSIS — E785 Hyperlipidemia, unspecified: Secondary | ICD-10-CM | POA: Diagnosis not present

## 2022-09-29 DIAGNOSIS — Z471 Aftercare following joint replacement surgery: Secondary | ICD-10-CM | POA: Diagnosis not present

## 2022-09-29 DIAGNOSIS — F32A Depression, unspecified: Secondary | ICD-10-CM | POA: Diagnosis not present

## 2022-09-29 DIAGNOSIS — K219 Gastro-esophageal reflux disease without esophagitis: Secondary | ICD-10-CM | POA: Diagnosis not present

## 2022-09-29 DIAGNOSIS — Z7984 Long term (current) use of oral hypoglycemic drugs: Secondary | ICD-10-CM | POA: Diagnosis not present

## 2022-09-29 DIAGNOSIS — K5909 Other constipation: Secondary | ICD-10-CM | POA: Diagnosis not present

## 2022-09-29 DIAGNOSIS — L89610 Pressure ulcer of right heel, unstageable: Secondary | ICD-10-CM | POA: Diagnosis not present

## 2022-09-29 DIAGNOSIS — R41841 Cognitive communication deficit: Secondary | ICD-10-CM | POA: Diagnosis not present

## 2022-09-29 DIAGNOSIS — E039 Hypothyroidism, unspecified: Secondary | ICD-10-CM | POA: Diagnosis not present

## 2022-09-29 DIAGNOSIS — Z7951 Long term (current) use of inhaled steroids: Secondary | ICD-10-CM | POA: Diagnosis not present

## 2022-09-29 DIAGNOSIS — I4891 Unspecified atrial fibrillation: Secondary | ICD-10-CM | POA: Diagnosis not present

## 2022-09-29 NOTE — Telephone Encounter (Signed)
[facility] has been informed

## 2022-09-30 ENCOUNTER — Encounter: Payer: Medicare HMO | Admitting: Pain Medicine

## 2022-09-30 ENCOUNTER — Ambulatory Visit: Payer: Medicare HMO | Admitting: Nurse Practitioner

## 2022-09-30 NOTE — Telephone Encounter (Signed)
Left a message for the patient or husband to call back.

## 2022-10-01 ENCOUNTER — Encounter: Payer: Medicare HMO | Attending: Physician Assistant | Admitting: Physician Assistant

## 2022-10-01 DIAGNOSIS — I480 Paroxysmal atrial fibrillation: Secondary | ICD-10-CM | POA: Insufficient documentation

## 2022-10-01 DIAGNOSIS — Z7901 Long term (current) use of anticoagulants: Secondary | ICD-10-CM | POA: Insufficient documentation

## 2022-10-01 DIAGNOSIS — L89616 Pressure-induced deep tissue damage of right heel: Secondary | ICD-10-CM | POA: Insufficient documentation

## 2022-10-01 DIAGNOSIS — L89623 Pressure ulcer of left heel, stage 3: Secondary | ICD-10-CM | POA: Diagnosis not present

## 2022-10-01 DIAGNOSIS — I87323 Chronic venous hypertension (idiopathic) with inflammation of bilateral lower extremity: Secondary | ICD-10-CM | POA: Insufficient documentation

## 2022-10-01 NOTE — Progress Notes (Signed)
CARLE, DARGAN ([id number]) [phone] [email or web address] Page 1 of 5 Visit Report for 10/01/2022 Abuse Risk Screen Details Patient Name: Date of Service: [name] 10/01/2022 9:30 A M Medical Record Number: [id number] Patient Account Number: [id number] Date of Birth/Sex: Treating RN: [birth year] (72 y.o. [name] Primary Care Roberto Hlavaty: Jamey Reas Other Clinician: Referring Arnika Larzelere: Treating [name]/Extender: Allen Derry Self, Referral Weeks in Treatment: 0 Abuse Risk Screen Items Answer ABUSE RISK SCREEN: Has anyone close to you tried to hurt or harm you recentlyo No Do you feel uncomfortable with anyone in your familyo No Has anyone forced you do things that you didnt want to doo No Electronic Signature(s) Signed: 10/01/2022 1:44:50 PM By: Yevonne Pax RN Entered By: Yevonne Pax on 10/01/2022 09:46:31 -------------------------------------------------------------------------------- Activities of Daily Living Details Patient Name: Date of Service: [name] 10/01/2022 9:30 A M Medical Record Number: [id number] Patient Account Number: [id number] Date of Birth/Sex: Treating RN: Oct 15, 1950 (72 y.o. [name] Primary Care Shaneta Cervenka: Jamey Reas Other Clinician: Referring Jakera Beaupre: Treating Conan Mcmanaway/Extender: Allen Derry Self, Referral Weeks in Treatment: 0 Activities of Daily Living Items Answer Activities of Daily Living (Please select one for each item) Drive Automobile Not Able T Medications ake Need Assistance Use T elephone Need Assistance Care for Appearance Need Assistance Use T oilet Need Assistance Bath / Shower Need Assistance Dress Self Need Assistance Feed Self Need Assistance Walk Need Assistance Get In / Out Bed Need Assistance Housework Not [name], [name] ([id number]) [phone] [email or web address] Page 2 of 5 Prepare Meals Not Able Handle Money Need Assistance Shop for Self Not  Able Electronic Signature(s) Signed: 10/01/2022 1:44:50 PM By: Yevonne Pax RN Entered By: Yevonne Pax on 10/01/2022 09:48:07 -------------------------------------------------------------------------------- Education Screening Details Patient Name: Date of Service: [name], Dawn Cruz [name]. 10/01/2022 9:30 A M Medical Record Number: [id number] Patient Account Number: [id number] Date of Birth/Sex: Treating RN: 02-04-1951 (72 y.o. [name] Primary Care Jerriah Ines: Jamey Reas Other Clinician: Referring Jasiah Buntin: Treating [name]/Extender: Allen Derry Self, Referral Weeks in Treatment: 0 Primary Learner Assessed: Patient Learning Preferences/Education Level/Primary Language Learning Preference: Explanation Highest Education Level: College or Above Preferred Language: English Cognitive Barrier Language Barrier: No Translator Needed: No Memory Deficit: No Emotional Barrier: No Cultural/Religious Beliefs Affecting Medical Care: No Physical Barrier Impaired Vision: Yes Glasses Impaired Hearing: No Decreased Hand dexterity: No Knowledge/Comprehension Knowledge Level: Medium Comprehension Level: High Ability to understand written instructions: High Ability to understand verbal instructions: High Motivation Anxiety Level: Anxious Cooperation: Cooperative Education Importance: Acknowledges Need Interest in Health Problems: Asks Questions Perception: Coherent Willingness to Engage in Self-Management High Activities: Readiness to Engage in Self-Management High Activities: Electronic Signature(s) Signed: 10/01/2022 1:44:50 PM By: Yevonne Pax RN Entered By: Yevonne Pax on 10/01/2022 09:48:38 [name] ([id number]) [id number] Page 3 of 5 -------------------------------------------------------------------------------- Fall Risk Assessment Details Patient Name: Date of Service: [name] 10/01/2022 9:30 A M Medical Record Number:  [id number] Patient Account Number: [id number] Date of Birth/Sex: Treating RN: [birth year] (72 y.o. [name], [name] Primary Care Erie Radu: Jamey Reas Other Clinician: Referring [name]: Treating [name]/Extender: Allen Derry Self, Referral Weeks in Treatment: 0 Fall Risk Assessment Items Have you had 2 or more falls in the last 12 monthso 0 No Have you had any fall that resulted in injury in the last 12 monthso 0 No FALLS RISK SCREEN History of falling - immediate or within 3 months 0 No Secondary diagnosis (Do you have 2 or  more medical diagnoseso) 0 No Ambulatory aid None/bed rest/wheelchair/nurse 0 Yes Crutches/cane/walker 0 No Furniture 0 No Intravenous therapy Access/Saline/Heparin Lock 0 No Gait/Transferring Normal/ bed rest/ wheelchair 0 Yes Weak (short steps with or without shuffle, stooped but able to lift head while walking, may seek 0 No support from furniture) Impaired (short steps with shuffle, may have difficulty arising from chair, head down, impaired 0 No balance) Mental Status Oriented to own ability 0 Yes Electronic Signature(s) Signed: 10/01/2022 1:44:50 PM By: Yevonne Pax RN Entered By: Yevonne Pax on 10/01/2022 09:49:28 -------------------------------------------------------------------------------- Foot Assessment Details Patient Name: Date of Service: [name], Dawn Cruz [name]. 10/01/2022 9:30 A M Medical Record Number: [id number] Patient Account Number: [id number] Date of Birth/Sex: Treating RN: [birth year] (72 y.o. [name] Primary Care Cobain Morici: Jamey Reas Other Clinician: Referring Sabre Leonetti: Treating Adonis Yim/Extender: Allen Derry Self, Referral Weeks in Treatment: 0 Foot Assessment Items Site Locations [name], [name] ([id number]) [phone] [id number] Page 4 of 5 + = Sensation present, - = Sensation absent, C = Callus, U = Ulcer R = Redness, W = Warmth, M = Maceration, PU = Pre-ulcerative lesion F = Fissure, S =  Swelling, D = Dryness Assessment Right: Left: Other Deformity: No No Prior Foot Ulcer: No No Prior Amputation: No No Charcot Joint: No No Ambulatory Status: Ambulatory With Help Assistance Device: Cane Gait: Steady Electronic Signature(s) Signed: 10/01/2022 1:44:50 PM By: Yevonne Pax RN Entered By: Yevonne Pax on 10/01/2022 10:02:22 -------------------------------------------------------------------------------- Nutrition Risk Screening Details Patient Name: Date of Service: [name] 10/01/2022 9:30 A M Medical Record Number: [id number] Patient Account Number: [id number] Date of Birth/Sex: Treating RN: 16-Oct-1950 (72 y.o. [name], [name] Primary Care Maycee Blasco: Jamey Reas Other Clinician: Referring [name]: Treating [name]/Extender: Allen Derry Self, Referral Weeks in Treatment: 0 Height (in): 64 Weight (lbs): 250 Body Mass Index (BMI): 42.9 Nutrition Risk Screening Items Score Screening NUTRITION RISK SCREEN: I have an illness or condition that made me change the kind and/or amount of food I eat 0 No I eat fewer than two meals per day 0 No I eat few fruits and vegetables, or milk products 0 No I have three or more drinks of beer, liquor or wine almost every day 0 No I have tooth or mouth problems that make it hard for me to eat 0 No [name], [name] ([id number]) [id number] Page 5 of 5 I don't always have enough money to buy the food I need 0 No I eat alone most of the time 0 No I take three or more different prescribed or over-the-counter drugs a day 1 Yes Without wanting to, I have lost or gained 10 pounds in the last six months 0 No I am not always physically able to shop, cook and/or feed myself 0 No Nutrition Protocols Good Risk Protocol 0 No interventions needed Moderate Risk Protocol High Risk Proctocol Risk Level: Good Risk Score: 1 Electronic Signature(s) Signed: 10/01/2022 1:44:50 PM By: Yevonne Pax RN Entered  By: Yevonne Pax on 10/01/2022 09:50:39

## 2022-10-01 NOTE — Progress Notes (Signed)
TACEY, DIMAGGIO ([id number]) [id number] Page 1 of 9 Visit Report for 10/01/2022 Allergy List Details Patient Name: Date of Service: [name] 10/01/2022 9:30 A M Medical Record Number: [id number] Patient Account Number: [id number] Date of Birth/Sex: Treating RN: 1950-11-16 (72 y.o. [name] Primary Care Johnna Bollier: Jamey Reas Other Clinician: Referring Chantay Whitelock: Treating Cayenne Breault/Extender: Allen Derry Self, Referral Weeks in Treatment: 0 Allergies Active Allergies Penicillins Reaction: hives Severity: Severe Active: 01/23/2013 Terramycin Reaction: hives Severity: Severe Active: 01/23/2013 aspirin Reaction: hives Active: 01/23/2013 Allergy Notes Electronic Signature(s) Signed: 10/01/2022 1:44:50 PM By: Yevonne Pax RN Entered By: Yevonne Pax on 10/01/2022 09:45:51 -------------------------------------------------------------------------------- Arrival Information Details Patient Name: Date of Service: [name], [name]. 10/01/2022 9:30 A M Medical Record Number: [id number] Patient Account Number: [id number] Date of Birth/Sex: Treating RN: 03/14/1951 (72 y.o. [name] Primary Care Earnestene Angello: Jamey Reas Other Clinician: Referring Marlisha Vanwyk: Treating Rever Pichette/Extender: Allen Derry Self, Referral Weeks in Treatment: 0 Visit Information Patient Arrived: Wheel Chair Arrival Time: 09:40 [name], [name] ([id number]) Accompanied By: husband Transfer Assistance: None Patient Identification Verified: Yes Secondary Verification Process Completed: Yes Patient Requires Transmission-Based Precautions: No Patient Has Alerts: Yes Patient Alerts: Patient on Blood Thin ABI L 1.1 02/05/22 ABI R 1.12 02/05/22 History Since Last Visit Added or deleted any medications: No Any new allergies or adverse reactions: No Had a fall or experienced change in Had a fall or experienced change in activities of daily living that may affect risk of  falls: No [id number] Page 2 of 9 No Signs or symptoms of abuse/neglect since last visito No Hospitalized since last visit: No Implantable device outside of the clinic excluding cellular tissue based products placed in the center nerHas Dressing in Place as Prescribed: Yes Pain Present Now: No Electronic Signature(s) Signed: 10/01/2022 1:44:50 PM By: Yevonne Pax RN Entered By: Yevonne Pax on 10/01/2022 09:42:46 -------------------------------------------------------------------------------- Clinic Level of Care Assessment Details Patient Name: Date of Service: [name] 10/01/2022 9:30 A M Medical Record Number: [id number] Patient Account Number: [id number] Date of Birth/Sex: Treating RN: 05-26-1950 (72 y.o. [name] Primary Care Aniruddh Ciavarella: Jamey Reas Other Clinician: Referring Montrey Buist: Treating Yamen Castrogiovanni/Extender: Allen Derry Self, Referral Weeks in Treatment: 0 Clinic Level of Care Assessment Items TOOL 1 Quantity Score X- 1 0 Use when EandM and Procedure is performed on INITIAL visit ASSESSMENTS - Nursing Assessment / Reassessment X- 1 20 General Physical Exam (combine w/ comprehensive assessment (listed just below) when performed on new pt. evals) X- 1 25 Comprehensive Assessment (HX, ROS, Risk Assessments, Wounds Hx, etc.) ASSESSMENTS - Wound and Skin Assessment / Reassessment []  - 0 Dermatologic / Skin Assessment (not related to wound area) ASSESSMENTS - Ostomy and/or Continence Assessment and Care []  - 0 Incontinence Assessment and Management []  - 0 Ostomy Care Assessment and Management (repouching, etc.) PROCESS - Coordination of Care X - Simple Patient / Family Education for ongoing care 1 15 []  - 0 Complex (extensive) Patient / Family Education for ongoing care []  - 0 Staff obtains Chiropractor, Records, T Results / Process Orders est []  - 0 Staff telephones HHA, Nursing Homes / Clarify orders / etc []  - 0 Routine  Transfer to another Facility (non-emergent condition) []  - 0 Routine Hospital Admission (non-emergent condition) X- 1 15 New Admissions / Manufacturing engineer / Ordering NPWT Apligraf, etc. , []  - 0 Emergency Hospital Admission (emergent condition) PROCESS - Special Needs []  - 0 Pediatric / Minor Patient Management []  - 0  Isolation Patient Management [name], [name] ([id number]) [id number] Page 3 of 9 []  - 0 Hearing / Language / Visual special needs []  - 0 Assessment of Community assistance (transportation, D/C planning, etc.) []  - 0 Additional assistance / Altered mentation []  - 0 Support Surface(s) Assessment (bed, cushion, seat, etc.) INTERVENTIONS - Miscellaneous []  - 0 External ear exam []  - 0 Patient Transfer (multiple staff / Nurse, adult / Similar devices) []  - 0 Simple Staple / Suture removal (25 or less) []  - 0 Complex Staple / Suture removal (26 or more) []  - 0 Hypo/Hyperglycemic Management (do not check if billed separately) []  - 0 Ankle / Brachial Index (ABI) - do not check if billed separately Has the patient been seen at the hospital within the last three years: Yes Total Score: 75 Level Of Care: New/Established - Level 2 Electronic Signature(s) Signed: 10/01/2022 1:44:50 PM By: Yevonne Pax RN Entered By: Yevonne Pax on 10/01/2022 10:55:07 -------------------------------------------------------------------------------- Encounter Discharge Information Details Patient Name: Date of Service: [name], [name]. 10/01/2022 9:30 A M Medical Record Number: [id number] Patient Account Number: [id number] Date of Birth/Sex: Treating RN: 08/16/1950 (72 y.o. [name] Primary Care Jaide Hillenburg: Jamey Reas Other Clinician: Referring Sylvanna Burggraf: Treating Vira Chaplin/Extender: Allen Derry Self, Referral Weeks in Treatment: 0 Encounter Discharge Information Items Post Procedure Vitals Discharge Condition: Stable Temperature (F):  97.7 Ambulatory Status: Wheelchair Pulse (bpm): 87 Discharge Destination: Home Respiratory Rate (breaths/min): 18 Transportation: Private Auto Blood Pressure (mmHg): 115/74 Accompanied By: caregiver Schedule Follow-up Appointment: Yes Clinical Summary of Care: Electronic Signature(s) Signed: 10/01/2022 10:56:57 AM By: Yevonne Pax RN Entered By: Yevonne Pax on 10/01/2022 10:56:57 [name] ([id number]) [id number] Page 4 of 9 -------------------------------------------------------------------------------- Lower Extremity Assessment Details Patient Name: Date of Service: [name] 10/01/2022 9:30 A M Medical Record Number: [id number] Patient Account Number: [id number] Date of Birth/Sex: Treating RN: 1951-03-16 (72 y.o. [name] Primary Care Zaeda Mcferran: Jamey Reas Other Clinician: Referring Kella Splinter: Treating Rufus Beske/Extender: Allen Derry Self, Referral Weeks in Treatment: 0 Electronic Signature(s) Signed: 10/01/2022 10:20:52 AM By: Yevonne Pax RN Entered By: Yevonne Pax on 10/01/2022 10:20:52 -------------------------------------------------------------------------------- Multi Wound Chart Details Patient Name: Date of Service: [name], [name]. 10/01/2022 9:30 A M Medical Record Number: [id number] Patient Account Number: [id number] Date of Birth/Sex: Treating RN: 01-29-1951 (72 y.o. [name] Primary Care Susano Cleckler: Jamey Reas Other Clinician: Referring Jamerson Vonbargen: Treating Sharnee Douglass/Extender: Allen Derry Self, Referral Weeks in Treatment: 0 Vital Signs Height(in): 64 Pulse(bpm): 87 Weight(lbs): 250 Blood Pressure(mmHg): 115/74 Body Mass Index(BMI): 42.9 Temperature(F): 97.7 Respiratory Rate(breaths/min): 18 [Treatment Notes:Wound Assessments Treatment Notes] Electronic Signature(s) Signed: 10/01/2022 1:44:50 PM By: Yevonne Pax RN Entered By: Yevonne Pax on 10/01/2022 10:02:41 [name] ([id number])  [id number] Page 5 of 9 -------------------------------------------------------------------------------- Multi-Disciplinary Care Plan Details Patient Name: Date of Service: [name] 10/01/2022 9:30 A M Medical Record Number: [id number] Patient Account Number: [id number] Date of Birth/Sex: Treating RN: [birth year] (72 y.o. [name] Primary Care Shan Padgett: Jamey Reas Other Clinician: Referring Shelba Susi: Treating Laverne Hursey/Extender: Allen Derry Self, Referral Weeks in Treatment: 0 Active Inactive Necrotic Tissue Nursing Diagnoses: Knowledge deficit related to management of necrotic/devitalized tissue Goals: Necrotic/devitalized tissue will be minimized in the wound bed Date Initiated: 10/01/2022 Target Resolution Date: 11/01/2022 Goal Status: Active Interventions: Assess patient pain level pre-, during and post procedure and prior to discharge Treatment Activities: Apply topical anesthetic as ordered : 10/01/2022 Notes: Wound/Skin Impairment Nursing Diagnoses: Knowledge deficit related to  ulceration/compromised skin integrity Goals: Patient/caregiver will verbalize understanding of skin care regimen Date Initiated: 10/01/2022 Target Resolution Date: 11/01/2022 Goal Status: Active Ulcer/skin breakdown will have a volume reduction of 30% by week 4 Date Initiated: 10/01/2022 Target Resolution Date: 11/01/2022 Goal Status: Active Ulcer/skin breakdown will have a volume reduction of 50% by week 8 Date Initiated: 10/01/2022 Target Resolution Date: 12/02/2022 Goal Status: Active Ulcer/skin breakdown will have a volume reduction of 80% by week 12 Date Initiated: 10/01/2022 Target Resolution Date: 01/01/2023 Goal Status: Active Ulcer/skin breakdown will heal within 14 weeks Date Initiated: 10/01/2022 Target Resolution Date: 02/01/2023 Goal Status: Active Interventions: Assess patient/caregiver ability to obtain necessary supplies Assess  patient/caregiver ability to perform ulcer/skin care regimen upon admission and as needed Assess ulceration(s) every visit Notes: Electronic Signature(s) Signed: 10/01/2022 1:44:50 PM By: Yevonne Pax RN Entered By: Yevonne Pax on 10/01/2022 10:04:22 [name] ([id number]) [id number] Page 6 of 9 -------------------------------------------------------------------------------- Pain Assessment Details Patient Name: Date of Service: [name] 10/01/2022 9:30 A M Medical Record Number: [id number] Patient Account Number: [id number] Date of Birth/Sex: Treating RN: 10-23-1950 (72 y.o. [name] Primary Care Shannin Naab: Jamey Reas Other Clinician: Referring Amarilys Lyles: Treating Flint Hakeem/Extender: Allen Derry Self, Referral Weeks in Treatment: 0 Active Problems Location of Pain Severity and Description of Pain Patient Has Paino Yes Site Locations With Dressing Change: No Duration of the Pain. Constant / Intermittento Intermittent How Long Does it Lasto Hours: Minutes: 15 Rate the pain. Current Pain Level: 5 Worst Pain Level: 8 Least Pain Level: 0 Tolerable Pain Level: 5 Character of Pain Describe the Pain: Shooting, Throbbing Pain Management and Medication Current Pain Management: Medication: Yes Cold Application: No Rest: Yes Massage: No Activity: No T.E.N.S.: No Heat Application: No Leg drop or elevation: No Is the Current Pain Management Adequate: Inadequate How does your wound impact your activities of daily livingo Sleep: No Bathing: No Appetite: No Relationship With Others: No Bladder Continence: No Emotions: No Bowel Continence: No Work: No Toileting: No Drive: No Dressing: No Hobbies: No Electronic Signature(s) Signed: 10/01/2022 1:44:50 PM By: Yevonne Pax RN Entered By: Yevonne Pax on 10/01/2022 09:45:02 [name] ([id number]) [id number] Page 7 of  9 -------------------------------------------------------------------------------- Patient/Caregiver Education Details Patient Name: Date of Service: [name] 7/11/2024andnbsp9:30 A M Medical Record Number: [id number] Patient Account Number: [id number] Date of Birth/Gender: Treating RN: [birth year] (72 y.o. [name] Primary Care Physician: Jamey Reas Other Clinician: Referring Physician: Treating Physician/Extender: Allen Derry Self, Referral Weeks in Treatment: 0 Education Assessment Education Provided To: Patient Education Topics Provided Wound/Skin Impairment: Handouts: Caring for Your Ulcer Methods: Explain/Verbal Responses: State content correctly Electronic Signature(s) Signed: 10/01/2022 1:44:50 PM By: Yevonne Pax RN Entered By: Yevonne Pax on 10/01/2022 10:04:34 -------------------------------------------------------------------------------- Wound Assessment Details Patient Name: Date of Service: [name] 10/01/2022 9:30 A M Medical Record Number: [id number] Patient Account Number: [id number] Date of Birth/Sex: Treating RN: 1950/08/23 (72 y.o. [name], [name]: Jamey Reas Other Clinician: Referring Kristalynn Coddington: Treating Daxter Paule/Extender: Allen Derry Self, Referral Weeks in Treatment: 0 Wound Status Wound Number: 24 Primary Pressure Ulcer Etiology: Wound Location: Left Calcaneus Wound Open Wounding Event: Gradually Appeared Status: Date Acquired: 08/22/2022 Comorbid Asthma, Pneumothorax, Arrhythmia, Hypertension, Type II Weeks Of Treatment: 0 History: Diabetes, Gout, Osteoarthritis, Neuropathy Clustered Wound: No Wound Measurements Length: (cm) 1 Width: (cm) 1.5 Depth: (cm) 0.1 Area: (cm) 1.17 Mcmorris, [name] ([id number]) Volume: (cm) % Reduction in Area: % Reduction in Volume:  Epithelialization: None 8 Undermining: No [id number] Page 8 of 9 0.118 Wound  Description Classification: Category/Stage III Exudate Amount: Medium Exudate Type: Serosanguineous Exudate Color: red, brown Foul Odor After Cleansing: No Slough/Fibrino Yes Wound Bed Granulation Amount: Small (1-33%) Exposed Structure Granulation Quality: Red Fascia Exposed: No Necrotic Amount: Large (67-100%) Fat Layer (Subcutaneous Tissue) Exposed: Yes Necrotic Quality: Adherent Slough Tendon Exposed: No Muscle Exposed: No Joint Exposed: No Bone Exposed: No Treatment Notes Wound #24 (Calcaneus) Wound Laterality: Left Cleanser Normal Saline Discharge Instruction: Wash your hands with soap and water. Remove old dressing, discard into plastic bag and place into trash. Cleanse the wound with Normal Saline prior to applying a clean dressing using gauze sponges, not tissues or cotton balls. Do not scrub or use excessive force. Pat dry using gauze sponges, not tissue or cotton balls. Soap and Water Discharge Instruction: Gently cleanse wound with antibacterial soap, rinse and pat dry prior to dressing wounds Peri-Wound Care Topical Primary Dressing Promogran Matrix 4.34 (in) Discharge Instruction: Moisten w/normal saline or sterile water; Cover wound as directed. Do not remove from wound bed. Secondary Dressing (BORDER) Zetuvit Plus SILICONE BORDER Dressing 4x4 (in/in) Discharge Instruction: Please do not put silicone bordered dressings under wraps. Use non-bordered dressing only. Secured With Compression Wrap Compression Stockings Add-Ons Electronic Signature(s) Signed: 10/01/2022 10:20:25 AM By: Yevonne Pax RN Entered By: Yevonne Pax on 10/01/2022 10:20:24 -------------------------------------------------------------------------------- Vitals Details Patient Name: Date of Service: [name], [name]. 10/01/2022 9:30 A M Medical Record Number: [id number] Patient Account Number: [id number] Date of Birth/Sex: Treating RN: 1950/06/26 (72 y.o. [name] Primary Care  Pinkey Mcjunkin: Jamey Reas Other Clinician: Referring Oluwademilade Mckiver: Treating Laquitha Heslin/Extender: Allen Derry Self, Referral Weeks in TreatmentTALMA, [name] ([id number]) [id number] Page 9 of 9 Vital Signs Time Taken: 09:45 Temperature (F): 97.7 Height (in): 64 Pulse (bpm): 87 Source: Stated Respiratory Rate (breaths/min): 18 Weight (lbs): 250 Blood Pressure (mmHg): 115/74 Source: Stated Reference Range: 80 - 120 mg / dl Body Mass Index (BMI): 42.9 Electronic Signature(s) Signed: 10/01/2022 1:44:50 PM By: Yevonne Pax RN Entered By: Yevonne Pax on 10/01/2022 09:45:34

## 2022-10-02 ENCOUNTER — Telehealth: Payer: Self-pay

## 2022-10-02 NOTE — Progress Notes (Signed)
Dawn, [name] ([id number]) [id number] Page 1 of 12 Visit Report for 10/01/2022 Chief Complaint Document Details Patient Name: Date of Service: [name] 10/01/2022 9:30 A M Medical Record Number: [id number] Patient Account Number: [id number] Date of Birth/Sex: Treating RN: Oct 17, 1950 (72 y.o. [name] Primary Care Provider: Jamey Reas Other Clinician: Referring Provider: Treating Provider/Extender: Allen Derry Self, Referral Weeks in Treatment: 0 Information Obtained from: Patient Chief Complaint Bilateral heel ulcers Electronic Signature(s) Signed: 10/01/2022 10:25:14 AM By: Allen Derry PA-C Entered By: Allen Derry on 10/01/2022 10:25:14 -------------------------------------------------------------------------------- Debridement Details Patient Name: Date of Service: [name], Dawn Cruz B. 10/01/2022 9:30 A M Medical Record Number: [id number] Patient Account Number: [id number] Date of Birth/Sex: Treating RN: 09-May-1950 (72 y.o. [name] Primary Care Provider: Jamey Reas Other Clinician: Referring Provider: Treating Provider/Extender: Allen Derry Self, Referral Weeks in Treatment: 0 Debridement Performed for Assessment: Wound #24 Left Calcaneus Performed By: Physician Allen Derry, PA-C Debridement Type: Debridement Level of Consciousness (Pre-procedure): Awake and Alert Pre-procedure Verification/Time Out Yes - 10:30 Taken: Start Time: 10:30 Pain Control: Lidocaine 4% Topical Solution Percent of Wound Bed Debrided: 100% T Area Debrided (cm): otal 1.18 Tissue and other material debrided: Slough, Subcutaneous, Biofilm, Slough Level: Skin/Subcutaneous Tissue Debridement Description: Excisional Instrument: Curette Bleeding: Minimum Hemostasis Achieved: Pressure End Time: 10:35 Procedural Pain: 0 Post Procedural Pain: 0 LUCIENNE, AMBS B ([id number]) [id number] Page 2 of 12 Response to  Treatment: Procedure was tolerated well Level of Consciousness (Post- Awake and Alert procedure): Post Debridement Measurements of Total Wound Length: (cm) 1 Stage: Category/Stage III Width: (cm) 1.5 Depth: (cm) 0.1 Volume: (cm) 0.118 Character of Wound/Ulcer Post Debridement: Improved Post Procedure Diagnosis Same as Pre-procedure Electronic Signature(s) Signed: 10/01/2022 1:44:50 PM By: Yevonne Pax RN Signed: 10/01/2022 5:17:09 PM By: Allen Derry PA-C Entered By: Yevonne Pax on 10/01/2022 10:35:55 -------------------------------------------------------------------------------- HPI Details Patient Name: Date of Service: [name], Dawn Cruz B. 10/01/2022 9:30 A M Medical Record Number: [id number] Patient Account Number: [id number] Date of Birth/Sex: Treating RN: 02-Sep-1950 (72 y.o. [name] Primary Care Provider: Jamey Reas Other Clinician: Referring Provider: Treating Provider/Extender: Allen Derry Self, Referral Weeks in Treatment: 0 History of Present Illness HPI Description: 06/28/20 upon evaluation today patient appears to be doing unfortunately somewhat poorly in regard to wounds that she has over her lower extremities. She has a wound on the left distal second toe, left posterior heel, and right anterior lower leg. With that being said all in all none of the wounds appear to be too bad the one that hurts her the most is actually the wound on the posterior heel which actually is also one of the smallest. Nonetheless I think there is some callus hiding what is really going on in this area. Fortunately there does not appear to be any obvious evidence of infection which is great news. Patient does have a history of diabetes mellitus type 2, chronic venous insufficiency, hypertension, history of DVT and is on long-term anticoagulant s, therapy, Eliquis. 07/05/20 upon evaluation today patient appears to be doing well currently with regard to her wounds. I feel like she is making  progress which is great news and overall there is no signs of active infection at this time. No fevers, chills, nausea, vomiting, or diarrhea. 4/29; patient has a only a small area remaining on the tip of her left second toe and a small area remaining on the left heel. The area on the right anterior lower leg is  healed. They tell me that she had remote podiatric surgery on the toes of the left foot however they are very fixed in position and I wonder whether they are making it difficult to relieve pressure in her foot wear. She is a type II diabetic with neuropathy as well. 08/01/2020 upon evaluation today patient appears to be doing well currently in regard to her wounds. In fact everything appears to be doing much better. The posterior aspect of her heel is actually giving her some trouble not the Achilles area but a small wound that we did not even have marked. In fact I had noted this before. Nonetheless we are going to addressed that today. 5/27; most of the patient's wounds are healed including the left Achilles heel left toe. Right forearm is also healed. She has a new abrasion posteriorly in the right elbow area just above the olecranon this happened today with an abrasion from her car door 6/15; the patient's wounds on the left Achilles and left second toe remain healed. The area on the right forearm is also just about healed in fact the skin I replaced last time is looking viable which is good. She had me look at the left heel again which is not in the area of the wound more towards the tip of the heel at the Achilles insertion site as well as the tip of the left foot second toe. In the Achilles area that is clearly is friction probably in bed at night. Not really near where the wound was. The left second toe remains healed although this is hammered and overhanging first toe also puts pressure in this area 09/13/2020 upon evaluation today patient appears to be doing well with regard to all  previous wounds that she had. Everything is completely healed. With that being said I do think that the patient unfortunately has a new skin tear on the right forearm which is due to her put a Band-Aid on it and then when it pulled off this caused some issues with skin tearing. Nonetheless I think that this needs to be addressed today. Hopefully will take too long to get this to heal. She is having some issues with pain in her heel but I think this is more related to be honest to her neuropathy as opposed to anything else. Readmission: 10/30/2020 upon evaluation today patient appears to be doing poorly in regard to her legs bilaterally. She is also having an issue with her fourth toe on the left. Fortunately there does not appear to be any signs of active infection at this time. No fevers, chills, nausea, vomiting, or diarrhea. It is actually been quite a while since we have seen her. In fact June 24 was the last time that she was here in the clinic. She tells me she did not come back because things were just doing "pretty well". 11/29/2020 upon evaluation today patient actually appears to be doing well with regard to her arms those are completely healed. Her right leg is still open but AYAT, KRISCH B ([id number]) [id number] Page 3 of 12 doing okay. The fourth toe of the left foot does show some signs here of having some issues with necrotic tissue of an open wound. This is where a toenail was removed by podiatry. Subsequently I do think that we need to go ahead and see what we can do about getting this cleared away so being try to get some healing going forward. 12/13/2020 upon evaluation today patient appears to be  doing decently well in regard to her wound on the right leg. In general I think that she is making good progress here. Unfortunately she continues to have significant issues with swelling in the left leg is now even more swollen at this point. For that reason I  do believe she would be benefited by wrapping both sides although she is not interested in doing this. Overall I think that she is definitely making some good progress here nonetheless in regard to the right leg left leg leave some to be desired. 12/27/2020 upon evaluation today patient appears to be doing well with regard to her wound in regard to the legs. Unfortunately the toe was not doing nearly as well. I am much more concerned about infection and osteomyelitis here. She had the MRI but right now were not seeing obvious evidence of redness spreading up to her foot this is good news I do not have the result of the MRI as of yet. I am good to place her on doxycycline however due to the fact that the wound does seem to be getting a little worse in regard to her toe. 01/14/2021 upon evaluation today patient appears to be doing well with regard to her ulcers. The right leg is doing well and even the toe seems to be doing well though she still has some evidence of bone exposed on the toe which does not appear to be doing quite as good as I would like to see just and the fact that is still not covered over new tissue but looks tremendously better compared to 2 weeks ago. Overall very pleased in that regard. 01/28/2021 upon evaluation today patient's leg on the left actually appears to have new wounds this is due to her deciding to shave yesterday. Fortunately there does not appear to be any signs of infection currently. This is good news. Nonetheless I do believe that with these new areas open it would be best to wrap her leg to try to make sure we get this under control sooner rather than later. With regard to her toe there still does appear to be some evidence of bone exposed this was covered over with callus I did remove that today. Nonetheless I think this is significantly smaller than previous. 02/11/2021 upon evaluation today patient appears to be doing okay in regard to her leg ulcers. I do not see  anything obvious at this point this seems to be a complicating factor. I think that she is getting better. Were wrap of the right leg not the left leg. With that being said the left fourth toe ulcer still does have evidence of being open there is some drainage still on top of the wound I think we can to try to see about using something like a collagen dressing today to see if that would be of benefit for her. She is in agreement with giving this a trial. Nonetheless I am concerned about the fact that she continues to have issues here with this not healing it is confirmed chronic osteomyelitis she has been on antibiotics. I am going to extend those today as well. That is Keflex and that will be for 1 additional month. Nonetheless if she is not doing significantly better by the next time I see her I think that the biggest thing is going to be for [place] to go ahead and see what we can do about getting her started with hyperbaric oxygen therapy to try to save the toe. 02/20/2021 upon  evaluation today patient presents for follow-up she actually came in a little bit early as the home health nurse was concerned about her toe becoming "gangrenous". With that being said the good news is there is [place] no evidence of this and in fact the toe seems to be doing better at this point. What was over the toe was an area that had bled significantly and then subsequently dried making it look much worse than where things actually stand. There is just a very small opening remaining in the toe. In regard to her legs everything appears to be healed bilaterally. 02/24/2021 upon evaluation today patient appears to be doing well with regard to her toe ulcer. I am actually very pleased with where we stand and I think that she is making good progress at this time. There is still a very small opening although I think the biggest issue is the collagen seems to be getting stuck and drying up because the wound is so small we probably  just need to use something to keep this moist like a little piece of Xeroform gauze and see if we get this to close. 03/07/2021 upon evaluation patient actually appears to be making excellent progress in regard to her last wound on the toe. There is just a very tiny potential area that I can even be sure at first until I get some of the dry skin off when I did get this removed there was just a very tiny pinpoint opening that was barely even moist. Actually feel like this is very close to complete resolution. She has been using the Xeroform that is doing great. 03/25/2021 upon evaluation today patient appears to be doing well with regard to her toe ulcer. In fact this appears to be potentially completely healed. Fortunately I do not see anything draining at this point which is great news. She does have a small blood blister that is already drying up and looking okay on the third toe as well I am not sure what happened here she has an either. Otherwise she is having some weeping from the right leg this again is newer she tells me from last week weighing in that her pulmonologist and then today weighing in at the hematologist that she gained 30 pounds. This is quite significant if indeed true and I think she is to contact her primary care provider ASAP. This would account for why her right leg is weeping as well. 04/04/2020 upon evaluation today patient appears to be doing excellent in regard to her toe which I actually think is healed today. With that being said she unfortunately is having issues with her leg swelling and again several blisters on each leg that have reopened unfortunately. 05/02/2021 upon evaluation today patient appears to be doing well with regard to her wounds. In fact is down just 1 on the anterior portion of her right leg which is actually from a blister. Otherwise she is doing awesome. 05/15/2021 upon evaluation today patient appears to be doing better in regard to her leg ulcer  although she has a small irritated area from tape on the leg medially. She also has a skin tear to her right elbow region. Fortunately there does not appear to be any signs of active infection locally nor systemically at this point which is great news. No fevers, chills, nausea, vomiting, or diarrhea. 05/26/2021 upon evaluation today patient appears to be doing better in regard to the actual wounds although she is having a lot of leaking  from the right leg. She is also been using her lymphedema pumps along with wearing her compression. Nonetheless she is having a lot of trouble with her breathing I am concerned that the pumps may be contributing to this. 06/12/2021 upon evaluation today patient appears to be doing better in regard to a lot of things including her breathing as well as her legs. She still has a lot of swelling but the good news is this is better she lost about 20 pounds of fluid when she went to the hospital and they diuresed her. Subsequently this is good news. Nonetheless she actually told me that "if she ever gets that bad again for me to yell at her to go to the hospital". She said that I was not quite forceful enough and telling her what she needed to do. She also notes she will try to be more receptive in the future. 06/19/2021 upon evaluation today patient appears to be doing well with regard to her wound. She is tolerating the dressing changes without complication. Fortunately her legs on the right are completely healed. She did have an area that she hit her Achilles on the left but this too seems to be pretty much closed at this point. Readmission: 02-05-2022 upon evaluation today patient appears to be doing well currently in regard to her left leg although her right leg is actually giving her quite a bit more trouble at this point. Fortunately there does not appear to be any signs of infection locally or systemically which is great news. No fevers, chills, nausea, vomiting, or  diarrhea. Patient tells me that she began having issues more recently with her legs in the right leg is definitely worse than the left as far as the way she feels as well and she states it was around September 16 that she first noted this and then when she called it took about 2-1/2 weeks to get in to see me. Her past medical history really has not changed since I saw her back in March everything is pretty much about the same repeat ABIs appear to be doing well currently at 1.1 on the left and 1.12 on the right. 02-10-2022 upon evaluation today patient actually appears to be doing well. Culture actually showed evidence of Pseudomonas but her legs look amazing today I do not see any signs of infection I do not believe that we need to add anything especially in light of the fact there is a lot of interactions with anything to treat the Pseudomonas in her current medication regimen. For that reason we will just hold with where we stand and continue with the current therapy. 02-24-2022 upon evaluation today patient appears to be doing excellent in fact she appears to be completely healed based on what we are seeing. Readmission: ADELIZ, ABERG ([id number]) [id number] Page 4 of 12 10-01-2022 upon evaluation today patient presents for follow-up evaluation here in the clinic she has not been seen since December 2023. At that point she was actually being seen for her legs that is actually doing much better which is great news. Overall very pleased with her leg situation and she has been doing great with her compression socks. With that being said unfortunately she does seem to be having issues with her heel this was secondary to having her left shoulder replacement and subsequent to that she was in the hospital for 6 days during that time she developed a pressure wound on the left heel. Unfortunately that is now it  is giving her the most trouble she also has some irritation of the  right heel but is not nearly as significant as far as that is concerned there is no signs of active infection at this time which is great news and very pleased in that regard. Electronic Signature(s) Signed: 10/01/2022 11:10:38 AM By: Allen Derry PA-C Entered By: Allen Derry on 10/01/2022 11:10:38 -------------------------------------------------------------------------------- Physical Exam Details Patient Name: Date of Service: [name] 10/01/2022 9:30 A M Medical Record Number: [id number] Patient Account Number: [id number] Date of Birth/Sex: Treating RN: 19-Apr-1950 (72 y.o. [name] Primary Care Provider: Jamey Reas Other Clinician: Referring Provider: Treating Provider/Extender: Allen Derry Self, Referral Weeks in Treatment: 0 Constitutional sitting or standing blood pressure is within target range for patient.. pulse regular and within target range for patient.Marland Kitchen respirations regular, non-labored and within target range for patient.Marland Kitchen temperature within target range for patient.. Well-nourished and well-hydrated in no acute distress. Eyes conjunctiva clear no eyelid edema noted. pupils equal round and reactive to light and accommodation. Ears, Nose, Mouth, and Throat no gross abnormality of ear auricles or external auditory canals. normal hearing noted during conversation. mucus membranes moist. Respiratory normal breathing without difficulty. Cardiovascular 2+ dorsalis pedis/posterior tibialis pulses. no clubbing, cyanosis, significant edema, <3 sec cap refill. Musculoskeletal Patient unable to walk without assistance. Psychiatric this patient is able to make decisions and demonstrates good insight into disease process. Alert and Oriented x 3. pleasant and cooperative. Notes Upon inspection patient's wound on the left heel actually does appear to be showing signs of some necrotic tissue as well as some callus which was somewhat macerated at this point secondary  to drainage. Subsequently I am going to perform debridement today she is in agreement with that plan and postdebridement this wound actually appear to be doing significantly better which is great news and very pleased in that regard. Currently she is walking with the use of a hemiwalker she really likes this. Her legs in general seem to be in good shape. Electronic Signature(s) Signed: 10/01/2022 11:11:15 AM By: Allen Derry PA-C Entered By: Allen Derry on 10/01/2022 11:11:14 Leonel Ramsay ([id number]) [id number] Page 5 of 12 -------------------------------------------------------------------------------- Physician Orders Details Patient Name: Date of Service: [name] 10/01/2022 9:30 A M Medical Record Number: [id number] Patient Account Number: [id number] Date of Birth/Sex: Treating RN: 08-31-1950 (72 y.o. Sharyne Peach, Carrie Primary Care Provider: Jamey Reas Other Clinician: Referring Provider: Treating Provider/Extender: Allen Derry Self, Referral Weeks in Treatment: 0 Verbal / Phone Orders: No Diagnosis Coding ICD-10 Coding Code Description E11.621 Type 2 diabetes mellitus with foot ulcer I87.323 Chronic venous hypertension (idiopathic) with inflammation of bilateral lower extremity L89.623 Pressure ulcer of left heel, stage 3 L89.616 Pressure-induced deep tissue damage of right heel Z79.01 Long term (current) use of anticoagulants I48.0 Paroxysmal atrial fibrillation Follow-up Appointments Return Appointment in 1 week. Home Health [facility] for wound care. May utilize formulary equivalent dressing for wound treatment orders unless otherwise specified. Home Health Nurse may visit PRN to address patients wound care needs. Rolene Arbour for wound care to left calcan [phone] Anesthetic (Use 'Patient Medications' Section for Anesthetic Order Entry) Lidocaine applied to wound bed Edema Control - Lymphedema / Segmental Compressive  Device / Other Elevate, Exercise Daily and A void Standing for Long Periods of Time. Elevate legs to the level of the heart and pump ankles as often as possible Elevate leg(s) parallel to the floor when sitting. Wound Treatment Wound #  24 - Calcaneus Wound Laterality: Left Cleanser: Normal Saline 3 x Per Week/30 Days Discharge Instructions: Wash your hands with soap and water. Remove old dressing, discard into plastic bag and place into trash. Cleanse the wound with Normal Saline prior to applying a clean dressing using gauze sponges, not tissues or cotton balls. Do not scrub or use excessive force. Pat dry using gauze sponges, not tissue or cotton balls. Cleanser: Soap and Water 3 x Per Week/30 Days Discharge Instructions: Gently cleanse wound with antibacterial soap, rinse and pat dry prior to dressing wounds Prim Dressing: Promogran Matrix 4.34 (in) 3 x Per Week/30 Days ary Discharge Instructions: Moisten w/normal saline or sterile water; Cover wound as directed. Do not remove from wound bed. Secondary Dressing: (BORDER) Zetuvit Plus SILICONE BORDER Dressing 4x4 (in/in) 3 x Per Week/30 Days Discharge Instructions: Please do not put silicone bordered dressings under wraps. Use non-bordered dressing only. Electronic Signature(s) Signed: 10/01/2022 1:44:50 PM By: Yevonne Pax RN Signed: 10/01/2022 5:17:09 PM By: Allen Derry PA-C Entered By: Yevonne Pax on 10/01/2022 10:53:03 Leonel Ramsay ([id number]) [id number] Page 6 of 12 -------------------------------------------------------------------------------- Problem List Details Patient Name: Date of Service: [name] 10/01/2022 9:30 A M Medical Record Number: [id number] Patient Account Number: [id number] Date of Birth/Sex: Treating RN: Jul 02, 1950 (72 y.o. [name] Primary Care Provider: Jamey Reas Other Clinician: Referring Provider: Treating Provider/Extender: Allen Derry Self,  Referral Weeks in Treatment: 0 Active Problems ICD-10 Encounter Code Description Active Date MDM Diagnosis E11.621 Type 2 diabetes mellitus with foot ulcer 10/01/2022 No Yes I87.323 Chronic venous hypertension (idiopathic) with inflammation of bilateral lower 10/01/2022 No Yes extremity L89.623 Pressure ulcer of left heel, stage 3 10/01/2022 No Yes L89.616 Pressure-induced deep tissue damage of right heel 10/01/2022 No Yes Z79.01 Long term (current) use of anticoagulants 10/01/2022 No Yes I48.0 Paroxysmal atrial fibrillation 10/01/2022 No Yes Inactive Problems Resolved Problems Electronic Signature(s) Signed: 10/01/2022 10:24:59 AM By: Allen Derry PA-C Entered By: Allen Derry on 10/01/2022 10:24:58 Leonel Ramsay ([id number]) [id number] Page 7 of 12 -------------------------------------------------------------------------------- Progress Note Details Patient Name: Date of Service: [name] 10/01/2022 9:30 A M Medical Record Number: [id number] Patient Account Number: [id number] Date of Birth/Sex: Treating RN: 1950-11-11 (72 y.o. [name] Primary Care Provider: Jamey Reas Other Clinician: Referring Provider: Treating Provider/Extender: Allen Derry Self, Referral Weeks in Treatment: 0 Subjective Chief Complaint Information obtained from Patient Bilateral heel ulcers History of Present Illness (HPI) 06/28/20 upon evaluation today patient appears to be doing unfortunately somewhat poorly in regard to wounds that she has over her lower extremities. She has a wound on the left distal second toe, left posterior heel, and right anterior lower leg. With that being said all in all none of the wounds appear to be too bad the one that hurts her the most is actually the wound on the posterior heel which actually is also one of the smallest. Nonetheless I think there is some callus hiding what is really going on in this area. Fortunately there does not  appear to be any obvious evidence of infection which is great news. Patient does have a history of diabetes mellitus type 2, chronic venous insufficiency, hypertension, history of DVT and is on long-term anticoagulant therapy, Eliquis. s, 07/05/20 upon evaluation today patient appears to be doing well currently with regard to her wounds. I feel like she is making progress which is great news and overall there is no signs of active infection at this  time. No fevers, chills, nausea, vomiting, or diarrhea. 4/29; patient has a only a small area remaining on the tip of her left second toe and a small area remaining on the left heel. The area on the right anterior lower leg is healed. They tell me that she had remote podiatric surgery on the toes of the left foot however they are very fixed in position and I wonder whether they are making it difficult to relieve pressure in her foot wear. She is a type II diabetic with neuropathy as well. 08/01/2020 upon evaluation today patient appears to be doing well currently in regard to her wounds. In fact everything appears to be doing much better. The posterior aspect of her heel is actually giving her some trouble not the Achilles area but a small wound that we did not even have marked. In fact I had noted this before. Nonetheless we are going to addressed that today. 5/27; most of the patient's wounds are healed including the left Achilles heel left toe. Right forearm is also healed. She has a new abrasion posteriorly in the right elbow area just above the olecranon this happened today with an abrasion from her car door 6/15; the patient's wounds on the left Achilles and left second toe remain healed. The area on the right forearm is also just about healed in fact the skin I replaced last time is looking viable which is good. She had me look at the left heel again which is not in the area of the wound more towards the tip of the heel at the Achilles insertion site  as well as the tip of the left foot second toe. In the Achilles area that is clearly is friction probably in bed at night. Not really near where the wound was. The left second toe remains healed although this is hammered and overhanging first toe also puts pressure in this area 09/13/2020 upon evaluation today patient appears to be doing well with regard to all previous wounds that she had. Everything is completely healed. With that being said I do think that the patient unfortunately has a new skin tear on the right forearm which is due to her put a Band-Aid on it and then when it pulled off this caused some issues with skin tearing. Nonetheless I think that this needs to be addressed today. Hopefully will take too long to get this to heal. She is having some issues with pain in her heel but I think this is more related to be honest to her neuropathy as opposed to anything else. Readmission: 10/30/2020 upon evaluation today patient appears to be doing poorly in regard to her legs bilaterally. She is also having an issue with her fourth toe on the left. Fortunately there does not appear to be any signs of active infection at this time. No fevers, chills, nausea, vomiting, or diarrhea. It is actually been quite a while since we have seen her. In fact June 24 was the last time that she was here in the clinic. She tells me she did not come back because things were just doing "pretty well". 11/29/2020 upon evaluation today patient actually appears to be doing well with regard to her arms those are completely healed. Her right leg is still open but doing okay. The fourth toe of the left foot does show some signs here of having some issues with necrotic tissue of an open wound. This is where a toenail was removed by podiatry. Subsequently I do think  that we need to go ahead and see what we can do about getting this cleared away so being try to get some healing going forward. 12/13/2020 upon evaluation today  patient appears to be doing decently well in regard to her wound on the right leg. In general I think that she is making good progress here. Unfortunately she continues to have significant issues with swelling in the left leg is now even more swollen at this point. For that reason I do believe she would be benefited by wrapping both sides although she is not interested in doing this. Overall I think that she is definitely making some good progress here nonetheless in regard to the right leg left leg leave some to be desired. 12/27/2020 upon evaluation today patient appears to be doing well with regard to her wound in regard to the legs. Unfortunately the toe was not doing nearly as well. I am much more concerned about infection and osteomyelitis here. She had the MRI but right now were not seeing obvious evidence of redness spreading up to her foot this is good news I do not have the result of the MRI as of yet. I am good to place her on doxycycline however due to the fact that the wound does seem to be getting a little worse in regard to her toe. 01/14/2021 upon evaluation today patient appears to be doing well with regard to her ulcers. The right leg is doing well and even the toe seems to be doing well though she still has some evidence of bone exposed on the toe which does not appear to be doing quite as good as I would like to see just and the fact that is still not covered over new tissue but looks tremendously better compared to 2 weeks ago. Overall very pleased in that regard. 01/28/2021 upon evaluation today patient's leg on the left actually appears to have new wounds this is due to her deciding to shave yesterday. Fortunately there does not appear to be any signs of infection currently. This is good news. Nonetheless I do believe that with these new areas open it would be best to wrap her leg to try to make sure we get this under control sooner rather than later. With regard to her toe there  still does appear to be some evidence of bone exposed this was covered over with callus I did remove that today. Nonetheless I think this is significantly smaller than previous. 02/11/2021 upon evaluation today patient appears to be doing okay in regard to her leg ulcers. I do not see anything obvious at this point this seems to be a complicating factor. I think that she is getting better. Were wrap of the right leg not the left leg. With that being said the left fourth toe ulcer still does have evidence of being open there is some drainage still on top of the wound I think we can to try to see about using something like a collagen dressing today to see if that would be of benefit for her. She is in agreement with giving this a trial. Nonetheless I am concerned about the fact that she continues to have issues here with this not healing it is confirmed chronic osteomyelitis she has been on antibiotics. I am going to extend those today as well. That is Keflex and that will be for 1 additional month. Nonetheless if she is not doing significantly better by the next time I see her I think that  the biggest thing is going to be for [place] to go ahead and see what we can do about getting her started with hyperbaric oxygen therapy to try to save the toe. 02/20/2021 upon evaluation today patient presents for follow-up she actually came in a little bit early as the home health nurse was concerned about her toe becoming "gangrenous". With that being said the good news is there is [place] no evidence of this and in fact the toe seems to be doing better at this point. What was over the toe was an area that had bled significantly and then subsequently dried making it look much worse than where things actually stand. There is just a very small opening remaining in the toe. In regard to her legs everything appears to be healed bilaterally. 02/24/2021 upon evaluation today patient appears to be doing well with regard to her toe  ulcer. I am actually very pleased with where we stand and I think that KAYLNN, HEAVENER B ([id number]) [id number] Page 8 of 12 she is making good progress at this time. There is still a very small opening although I think the biggest issue is the collagen seems to be getting stuck and drying up because the wound is so small we probably just need to use something to keep this moist like a little piece of Xeroform gauze and see if we get this to close. 03/07/2021 upon evaluation patient actually appears to be making excellent progress in regard to her last wound on the toe. There is just a very tiny potential area that I can even be sure at first until I get some of the dry skin off when I did get this removed there was just a very tiny pinpoint opening that was barely even moist. Actually feel like this is very close to complete resolution. She has been using the Xeroform that is doing great. 03/25/2021 upon evaluation today patient appears to be doing well with regard to her toe ulcer. In fact this appears to be potentially completely healed. Fortunately I do not see anything draining at this point which is great news. She does have a small blood blister that is already drying up and looking okay on the third toe as well I am not sure what happened here she has an either. Otherwise she is having some weeping from the right leg this again is newer she tells me from last week weighing in that her pulmonologist and then today weighing in at the hematologist that she gained 30 pounds. This is quite significant if indeed true and I think she is to contact her primary care provider ASAP. This would account for why her right leg is weeping as well. 04/04/2020 upon evaluation today patient appears to be doing excellent in regard to her toe which I actually think is healed today. With that being said she unfortunately is having issues with her leg swelling and again several blisters on  each leg that have reopened unfortunately. 05/02/2021 upon evaluation today patient appears to be doing well with regard to her wounds. In fact is down just 1 on the anterior portion of her right leg which is actually from a blister. Otherwise she is doing awesome. 05/15/2021 upon evaluation today patient appears to be doing better in regard to her leg ulcer although she has a small irritated area from tape on the leg medially. She also has a skin tear to her right elbow region. Fortunately there does not appear to be any signs of  active infection locally nor systemically at this point which is great news. No fevers, chills, nausea, vomiting, or diarrhea. 05/26/2021 upon evaluation today patient appears to be doing better in regard to the actual wounds although she is having a lot of leaking from the right leg. She is also been using her lymphedema pumps along with wearing her compression. Nonetheless she is having a lot of trouble with her breathing I am concerned that the pumps may be contributing to this. 06/12/2021 upon evaluation today patient appears to be doing better in regard to a lot of things including her breathing as well as her legs. She still has a lot of swelling but the good news is this is better she lost about 20 pounds of fluid when she went to the hospital and they diuresed her. Subsequently this is good news. Nonetheless she actually told me that "if she ever gets that bad again for me to yell at her to go to the hospital". She said that I was not quite forceful enough and telling her what she needed to do. She also notes she will try to be more receptive in the future. 06/19/2021 upon evaluation today patient appears to be doing well with regard to her wound. She is tolerating the dressing changes without complication. Fortunately her legs on the right are completely healed. She did have an area that she hit her Achilles on the left but this too seems to be pretty much closed at this  point. Readmission: 02-05-2022 upon evaluation today patient appears to be doing well currently in regard to her left leg although her right leg is actually giving her quite a bit more trouble at this point. Fortunately there does not appear to be any signs of infection locally or systemically which is great news. No fevers, chills, nausea, vomiting, or diarrhea. Patient tells me that she began having issues more recently with her legs in the right leg is definitely worse than the left as far as the way she feels as well and she states it was around September 16 that she first noted this and then when she called it took about 2-1/2 weeks to get in to see me. Her past medical history really has not changed since I saw her back in March everything is pretty much about the same repeat ABIs appear to be doing well currently at 1.1 on the left and 1.12 on the right. 02-10-2022 upon evaluation today patient actually appears to be doing well. Culture actually showed evidence of Pseudomonas but her legs look amazing today I do not see any signs of infection I do not believe that we need to add anything especially in light of the fact there is a lot of interactions with anything to treat the Pseudomonas in her current medication regimen. For that reason we will just hold with where we stand and continue with the current therapy. 02-24-2022 upon evaluation today patient appears to be doing excellent in fact she appears to be completely healed based on what we are seeing. Readmission: 10-01-2022 upon evaluation today patient presents for follow-up evaluation here in the clinic she has not been seen since December 2023. At that point she was actually being seen for her legs that is actually doing much better which is great news. Overall very pleased with her leg situation and she has been doing great with her compression socks. With that being said unfortunately she does seem to be having issues with her heel this  was secondary  to having her left shoulder replacement and subsequent to that she was in the hospital for 6 days during that time she developed a pressure wound on the left heel. Unfortunately that is now it is giving her the most trouble she also has some irritation of the right heel but is not nearly as significant as far as that is concerned there is no signs of active infection at this time which is great news and very pleased in that regard. Patient History Information obtained from Patient. Allergies Penicillins (Severity: Severe, Reaction: hives), Terramycin (Severity: Severe, Reaction: hives), aspirin (Reaction: hives) Family History Cancer - Father, Diabetes - Paternal Grandparents,Father,Siblings, Heart Disease - Maternal Grandparents, Hypertension - Maternal Grandparents,Father, No family history of Kidney Disease, Lung Disease, Seizures, Stroke, Thyroid Problems. Social History Never smoker, Marital Status - Married, Alcohol Use - Never, Drug Use - No History, Caffeine Use - Rarely. Medical History Respiratory Patient has history of Asthma, Pneumothorax Cardiovascular Patient has history of Arrhythmia, Hypertension Endocrine Patient has history of Type II Diabetes Musculoskeletal Patient has history of Gout, Osteoarthritis Neurologic Patient has history of Neuropathy Oncologic Denies history of Received Chemotherapy, Received Radiation ELAINA, TAING B ([id number]) [id number] Page 9 of 12 Hospitalization/Surgery History - sleeve gastrectomy. Medical A Surgical History Notes nd Constitutional Symptoms (General Health) Sleeve gastrectomy Gastrointestinal gastric bypass in 11/2012 Objective Constitutional sitting or standing blood pressure is within target range for patient.. pulse regular and within target range for patient.Marland Kitchen respirations regular, non-labored and within target range for patient.Marland Kitchen temperature within target range for patient..  Well-nourished and well-hydrated in no acute distress. Vitals Time Taken: 9:45 AM, Height: 64 in, Source: Stated, Weight: 250 lbs, Source: Stated, BMI: 42.9, Temperature: 97.7 F, Pulse: 87 bpm, Respiratory Rate: 18 breaths/min, Blood Pressure: 115/74 mmHg. Eyes conjunctiva clear no eyelid edema noted. pupils equal round and reactive to light and accommodation. Ears, Nose, Mouth, and Throat no gross abnormality of ear auricles or external auditory canals. normal hearing noted during conversation. mucus membranes moist. Respiratory normal breathing without difficulty. Cardiovascular 2+ dorsalis pedis/posterior tibialis pulses. no clubbing, cyanosis, significant edema, Musculoskeletal Patient unable to walk without assistance. Psychiatric this patient is able to make decisions and demonstrates good insight into disease process. Alert and Oriented x 3. pleasant and cooperative. General Notes: Upon inspection patient's wound on the left heel actually does appear to be showing signs of some necrotic tissue as well as some callus which was somewhat macerated at this point secondary to drainage. Subsequently I am going to perform debridement today she is in agreement with that plan and postdebridement this wound actually appear to be doing significantly better which is great news and very pleased in that regard. Currently she is walking with the use of a hemiwalker she really likes this. Her legs in general seem to be in good shape. Integumentary (Hair, Skin) Wound #24 status is Open. Original cause of wound was Gradually Appeared. The date acquired was: 08/22/2022. The wound is located on the Left Calcaneus. The wound measures 1cm length x 1.5cm width x 0.1cm depth; 1.178cm^2 area and 0.118cm^3 volume. There is Fat Layer (Subcutaneous Tissue) exposed. There is no undermining noted. There is a medium amount of serosanguineous drainage noted. There is small (1-33%) red granulation within the wound  bed. There is a large (67-100%) amount of necrotic tissue within the wound bed including Adherent Slough. Assessment Active Problems ICD-10 Type 2 diabetes mellitus with foot ulcer Chronic venous hypertension (idiopathic) with inflammation of bilateral lower extremity Pressure ulcer  of left heel, stage 3 Pressure-induced deep tissue damage of right heel Long term (current) use of anticoagulants Paroxysmal atrial fibrillation Procedures Wound #24 Pre-procedure diagnosis of Wound #24 is a Pressure Ulcer located on the Left Calcaneus . There was a Excisional Skin/Subcutaneous Tissue Debridement with a total area of 1.18 sq cm performed by Allen Derry, PA-C. With the following instrument(s): Curette Material removed includes Subcutaneous Tissue, Slough, and Biofilm after achieving pain control using Lidocaine 4% Topical Solution. No specimens were taken. A time out was conducted at 10:30, prior to the start of the procedure. A Minimum amount of bleeding was controlled with Pressure. The procedure was tolerated well with a pain level of 0 throughout and a pain level of 0 following the procedure. Post Debridement Measurements: 1cm length x 1.5cm width x 0.1cm depth; 0.118cm^3 volume. Post debridement Stage noted as Category/Stage III. Character of Wound/Ulcer Post Debridement is improved. Post procedure Diagnosis Wound #24: Same as Pre-Procedure TASHEANNA, LANGSDORF B ([id number]) [id number] Page 10 of 12 Plan Follow-up Appointments: Return Appointment in 1 week. Home Health: [facility] for wound care. May utilize formulary equivalent dressing for wound treatment orders unless otherwise specified. Home Health Nurse may visit PRN to address patients wound care needs. Rolene Arbour for wound care to left calcan [phone] Anesthetic (Use 'Patient Medications' Section for Anesthetic Order Entry): Lidocaine applied to wound bed Edema Control - Lymphedema /  Segmental Compressive Device / Other: Elevate, Exercise Daily and Avoid Standing for Long Periods of Time. Elevate legs to the level of the heart and pump ankles as often as possible Elevate leg(s) parallel to the floor when sitting. WOUND #24: - Calcaneus Wound Laterality: Left Cleanser: Normal Saline 3 x Per Week/30 Days Discharge Instructions: Wash your hands with soap and water. Remove old dressing, discard into plastic bag and place into trash. Cleanse the wound with Normal Saline prior to applying a clean dressing using gauze sponges, not tissues or cotton balls. Do not scrub or use excessive force. Pat dry using gauze sponges, not tissue or cotton balls. Cleanser: Soap and Water 3 x Per Week/30 Days Discharge Instructions: Gently cleanse wound with antibacterial soap, rinse and pat dry prior to dressing wounds Prim Dressing: Promogran Matrix 4.34 (in) 3 x Per Week/30 Days ary Discharge Instructions: Moisten w/normal saline or sterile water; Cover wound as directed. Do not remove from wound bed. Secondary Dressing: (BORDER) Zetuvit Plus SILICONE BORDER Dressing 4x4 (in/in) 3 x Per Week/30 Days Discharge Instructions: Please do not put silicone bordered dressings under wraps. Use non-bordered dressing only. 1. I would recommend based on what we are seeing that we have the patient continue to monitor for any signs of infection or worsening regarding initiate treatment with Promogran which I think should do quite well for her she is in agreement with that plan. 2. I am good recommend as well that border foam to cover should be right mended for her at this point. 3. I am also going to suggest that the patient should have home health coming out as well since she has home health for her is good therapy and Occupational Therapy I think that were not going really be able to order supplies for her and so home health for the wound as well as going to be necessary. Will work on going ahead and get  that set up for her and she is in agreement with that plan. We will see patient back for reevaluation in 1 week here in the clinic. If  anything worsens or changes patient will contact our office for additional recommendations. Electronic Signature(s) Signed: 10/01/2022 11:12:04 AM By: Allen Derry PA-C Entered By: Allen Derry on 10/01/2022 11:12:04 -------------------------------------------------------------------------------- ROS/PFSH Details Patient Name: Date of Service: RO Yolanda Manges, [place] NCY B. 10/01/2022 9:30 A M Medical Record Number: [id number] Patient Account Number: [id number] Date of Birth/Sex: Treating RN: 10/03/1950 (72 y.o. [name] Primary Care Provider: Jamey Reas Other Clinician: Referring Provider: Treating Provider/Extender: Allen Derry Self, Referral Weeks in Treatment: 0 Information Obtained From Patient Constitutional Symptoms (General Health) Medical History: Past Medical History Notes: Sleeve gastrectomy Respiratory PRESLEE, DEMARK B ([id number]) [id number] Page 11 of 12 Medical History: Positive for: Asthma; Pneumothorax Cardiovascular Medical History: Positive for: Arrhythmia; Hypertension Gastrointestinal Medical History: Past Medical History Notes: gastric bypass in 11/2012 Endocrine Medical History: Positive for: Type II Diabetes Time with diabetes: 20 years Treated with: Oral agents, Diet Blood sugar tested every day: Yes Tested : Blood sugar testing results: Breakfast: 122; Bedtime: 146 Musculoskeletal Medical History: Positive for: Gout; Osteoarthritis Neurologic Medical History: Positive for: Neuropathy Oncologic Medical History: Negative for: Received Chemotherapy; Received Radiation Immunizations Pneumococcal Vaccine: Received Pneumococcal Vaccination: Yes Received Pneumococcal Vaccination On or After 60th Birthday: Yes Implantable Devices None Hospitalization / Surgery History Type of  Hospitalization/Surgery sleeve gastrectomy Family and Social History Cancer: Yes - Father; Diabetes: Yes - Paternal Grandparents,Father,Siblings; Heart Disease: Yes - Maternal Grandparents; Hypertension: Yes - Maternal Grandparents,Father; Kidney Disease: No; Lung Disease: No; Seizures: No; Stroke: No; Thyroid Problems: No; Never smoker; Marital Status - Married; Alcohol Use: Never; Drug Use: No History; Caffeine Use: Rarely; Financial Concerns: No; Food, Clothing or Shelter Needs: No; Support System Lacking: No; Transportation Concerns: No Electronic Signature(s) Signed: 10/01/2022 1:44:50 PM By: Yevonne Pax RN Signed: 10/01/2022 5:17:09 PM By: Allen Derry PA-C Entered By: Yevonne Pax on 10/01/2022 09:46:21 KARLE, WILLENBRING ([id number]) [id number] Page 12 of 12 -------------------------------------------------------------------------------- SuperBill Details Patient Name: Date of Service: [name] 10/01/2022 Medical Record Number: [id number] Patient Account Number: [id number] Date of Birth/Sex: Treating RN: February 28, 1951 (72 y.o. Sharyne Peach, Carrie Primary Care Provider: Jamey Reas Other Clinician: Referring Provider: Treating Provider/Extender: Allen Derry Self, Referral Weeks in Treatment: 0 Diagnosis Coding ICD-10 Codes Code Description E11.621 Type 2 diabetes mellitus with foot ulcer I87.323 Chronic venous hypertension (idiopathic) with inflammation of bilateral lower extremity L89.623 Pressure ulcer of left heel, stage 3 L89.616 Pressure-induced deep tissue damage of right heel Z79.01 Long term (current) use of anticoagulants I48.0 Paroxysmal atrial fibrillation Facility Procedures : CPT4 Code: 36644034 Description: 74259 - WOUND CARE VISIT-LEV 2 EST PT Modifier: Quantity: 1 : CPT4 Code: 56387564 Description: 11042 - DEB SUBQ TISSUE 20 SQ CM/< ICD-10 Diagnosis Description L89.623 Pressure ulcer of left heel, stage 3 Modifier: Quantity:  1 Physician Procedures : CPT4 Code Description Modifier [id number] 11042 - WC PHYS SUBQ TISS 20 SQ CM ICD-10 Diagnosis Description L89.623 Pressure ulcer of left heel, stage 3 Quantity: 1 Electronic Signature(s) Signed: 10/01/2022 11:12:15 AM By: Allen Derry PA-C Previous Signature: 10/01/2022 10:55:19 AM Version By: Yevonne Pax RN Entered By: Allen Derry on 10/01/2022 11:12:15

## 2022-10-02 NOTE — Telephone Encounter (Signed)
[facility] orders were received for provider signature.    Orders placed in provider to be signed folder.

## 2022-10-05 ENCOUNTER — Telehealth: Payer: Self-pay

## 2022-10-05 ENCOUNTER — Ambulatory Visit: Payer: Medicare HMO | Admitting: Nurse Practitioner

## 2022-10-05 NOTE — Telephone Encounter (Signed)
[facility] orders for pt have been received. They have been placed in provider to be signed folder

## 2022-10-05 NOTE — Telephone Encounter (Signed)
The patient has been made aware but did start back on the 40 mg Furosemide.

## 2022-10-05 NOTE — Telephone Encounter (Signed)
[facility] forms have been faxed to the given fax number [phone] with a completed transmission log

## 2022-10-05 NOTE — Telephone Encounter (Signed)
Provider signed all of [facility] POC and forms were given back to me.   I faxed the signed forms to the given fax number [phone] with a completed transmission log.

## 2022-10-07 ENCOUNTER — Ambulatory Visit (INDEPENDENT_AMBULATORY_CARE_PROVIDER_SITE_OTHER): Payer: Medicare HMO | Admitting: Nurse Practitioner

## 2022-10-07 VITALS — HR 64 | Temp 97.7°F | Ht 64.0 in | Wt 251.8 lb

## 2022-10-07 DIAGNOSIS — J4489 Other specified chronic obstructive pulmonary disease: Secondary | ICD-10-CM | POA: Diagnosis not present

## 2022-10-07 DIAGNOSIS — L89623 Pressure ulcer of left heel, stage 3: Secondary | ICD-10-CM | POA: Diagnosis not present

## 2022-10-07 DIAGNOSIS — E1165 Type 2 diabetes mellitus with hyperglycemia: Secondary | ICD-10-CM

## 2022-10-07 DIAGNOSIS — M109 Gout, unspecified: Secondary | ICD-10-CM | POA: Diagnosis not present

## 2022-10-07 DIAGNOSIS — K5909 Other constipation: Secondary | ICD-10-CM | POA: Diagnosis not present

## 2022-10-07 DIAGNOSIS — L89620 Pressure ulcer of left heel, unstageable: Secondary | ICD-10-CM | POA: Diagnosis not present

## 2022-10-07 DIAGNOSIS — Z7985 Long-term (current) use of injectable non-insulin antidiabetic drugs: Secondary | ICD-10-CM | POA: Diagnosis not present

## 2022-10-07 DIAGNOSIS — E538 Deficiency of other specified B group vitamins: Secondary | ICD-10-CM | POA: Diagnosis not present

## 2022-10-07 DIAGNOSIS — I10 Essential (primary) hypertension: Secondary | ICD-10-CM | POA: Diagnosis not present

## 2022-10-07 DIAGNOSIS — K219 Gastro-esophageal reflux disease without esophagitis: Secondary | ICD-10-CM | POA: Diagnosis not present

## 2022-10-07 DIAGNOSIS — R41841 Cognitive communication deficit: Secondary | ICD-10-CM | POA: Diagnosis not present

## 2022-10-07 DIAGNOSIS — R300 Dysuria: Secondary | ICD-10-CM

## 2022-10-07 DIAGNOSIS — M797 Fibromyalgia: Secondary | ICD-10-CM | POA: Diagnosis not present

## 2022-10-07 DIAGNOSIS — Z471 Aftercare following joint replacement surgery: Secondary | ICD-10-CM | POA: Diagnosis not present

## 2022-10-07 DIAGNOSIS — G4733 Obstructive sleep apnea (adult) (pediatric): Secondary | ICD-10-CM | POA: Diagnosis not present

## 2022-10-07 DIAGNOSIS — Z96612 Presence of left artificial shoulder joint: Secondary | ICD-10-CM | POA: Diagnosis not present

## 2022-10-07 DIAGNOSIS — E1151 Type 2 diabetes mellitus with diabetic peripheral angiopathy without gangrene: Secondary | ICD-10-CM | POA: Diagnosis not present

## 2022-10-07 DIAGNOSIS — Z792 Long term (current) use of antibiotics: Secondary | ICD-10-CM | POA: Diagnosis not present

## 2022-10-07 DIAGNOSIS — F419 Anxiety disorder, unspecified: Secondary | ICD-10-CM | POA: Diagnosis not present

## 2022-10-07 DIAGNOSIS — Z7951 Long term (current) use of inhaled steroids: Secondary | ICD-10-CM | POA: Diagnosis not present

## 2022-10-07 DIAGNOSIS — L89610 Pressure ulcer of right heel, unstageable: Secondary | ICD-10-CM | POA: Diagnosis not present

## 2022-10-07 DIAGNOSIS — N390 Urinary tract infection, site not specified: Secondary | ICD-10-CM | POA: Diagnosis not present

## 2022-10-07 DIAGNOSIS — E039 Hypothyroidism, unspecified: Secondary | ICD-10-CM | POA: Diagnosis not present

## 2022-10-07 DIAGNOSIS — L89616 Pressure-induced deep tissue damage of right heel: Secondary | ICD-10-CM | POA: Diagnosis not present

## 2022-10-07 DIAGNOSIS — Z7901 Long term (current) use of anticoagulants: Secondary | ICD-10-CM | POA: Diagnosis not present

## 2022-10-07 DIAGNOSIS — I4891 Unspecified atrial fibrillation: Secondary | ICD-10-CM | POA: Diagnosis not present

## 2022-10-07 DIAGNOSIS — Z7984 Long term (current) use of oral hypoglycemic drugs: Secondary | ICD-10-CM | POA: Diagnosis not present

## 2022-10-07 DIAGNOSIS — F32A Depression, unspecified: Secondary | ICD-10-CM | POA: Diagnosis not present

## 2022-10-07 DIAGNOSIS — E785 Hyperlipidemia, unspecified: Secondary | ICD-10-CM | POA: Diagnosis not present

## 2022-10-07 DIAGNOSIS — D509 Iron deficiency anemia, unspecified: Secondary | ICD-10-CM | POA: Diagnosis not present

## 2022-10-07 MED ORDER — TIRZEPATIDE 12.5 MG/0.5ML ~~LOC~~ SOAJ
12.5000 mg | SUBCUTANEOUS | 0 refills | Status: DC
Start: 2022-10-07 — End: 2022-12-08

## 2022-10-07 NOTE — Progress Notes (Signed)
[name], NP-C Phone: [phone]  [name] is a 72 y.o. adult who presents today for rehab follow up.   Patient s/p reverse arthroplasty of left shoulder on 09/01/2022. She went into a rehab facility after surgery. She returned home on 09/24/2022. She has home health, nursing and OT starting this afternoon. She has been doing very well since her surgery. She has had a significant improvement in her pain. She has been walking well with a hemiwalker at home. She was treated for a UTI during her stay at the rehab facility. She was treated with Bactrim DS for 7 days. She feels that she is still having symptoms and would like to have her urine checked. While in the hospital prior to rehab she developed bilateral heel ulcers. She has been seeing wound care once a week for treatment of these. Denies any changing or worsening. Denies fever or chills. She is scheduled to see her surgeon for follow up next week.   UTI:  Dysuria- Yes  Frequency- No   Urgency- Yes   Hematuria- No   Fever- No  Abd pain- No   Vaginal d/c- No   Social History   Tobacco Use  Smoking Status Never  Smokeless Tobacco Never    Current Outpatient Medications on File Prior to Visit  Medication Sig Dispense Refill   acetaminophen (TYLENOL) 500 MG tablet Take 2 tablets (1,000 mg total) by mouth every 8 (eight) hours. 30 tablet 0   albuterol (VENTOLIN HFA) 108 (90 Base) MCG/ACT inhaler Inhale 2 puffs into the lungs every 6 (six) hours as needed. 8 g 2   ALPRAZolam (XANAX) 0.25 MG tablet Take 1 tablet (0.25 mg total) by mouth 2 (two) times daily as needed for anxiety. See SIG change was taking 1/2 of 0.5 tablet bid prn changed to 0.25 bid prn. D/c 0.5 xanax 60 tablet 2   buPROPion (WELLBUTRIN XL) 150 MG 24 hr tablet Take 1 tablet (150 mg total) by mouth daily. 90 tablet 3   calcium-vitamin D (OSCAL WITH D) 500-200 MG-UNIT tablet Take 1 tablet by mouth 2 (two) times daily with a meal. 60 tablet 0   cephALEXin  (KEFLEX) 250 MG capsule Take 1 capsule (250 mg total) by mouth daily. 90 capsule 1   clobetasol cream (TEMOVATE) 0.05 % Apply 1 application topically 2 (two) times daily as needed (skin irritation).      colchicine 0.6 MG tablet Take 0.6 mg by mouth daily. Repeat I 1 hour if needed. No more than 3 tablets in 24 hours PRN for GOUT FLARE     Continuous Glucose Sensor (FREESTYLE LIBRE 3 SENSOR) MISC      cyanocobalamin (VITAMIN B12) 1000 MCG tablet Take 1,000 mcg by mouth daily.     diclofenac Sodium (VOLTAREN) 1 % GEL Apply 2 g topically 3 (three) times daily. 100 g 2   DULoxetine (CYMBALTA) 60 MG capsule Take 1 capsule (60 mg total) by mouth daily. (Patient taking differently: Take 60 mg by mouth daily. 6 pm) 90 capsule 3   ELIQUIS 5 MG TABS tablet TAKE 1 TABLET BY MOUTH TWICE DAILY 180 tablet 1   empagliflozin (JARDIANCE) 25 MG TABS tablet Take 1 tablet (25 mg total) by mouth daily before breakfast. 90 tablet 3   ergocalciferol (VITAMIN D2) 1.25 MG (50000 UT) capsule Take 50,000 Units by mouth once a week.     estradiol (ESTRACE) 0.1 MG/GM vaginal cream Apply 0.5mg  (pea-sized amount)  just inside the vaginal introitus with a finger-tip  on Monday, Wednesday and Friday nights, 42.5 g 11   ferrous sulfate 324 MG TBEC Take 324 mg by mouth daily with breakfast.     fluconazole (DIFLUCAN) 150 MG tablet Take 1 tablet by mouth x 1 dose. May repeat in 72 hours if needed. 3 tablet 1   Fluticasone-Umeclidin-Vilant (TRELEGY ELLIPTA) 200-62.5-25 MCG/ACT AEPB Inhale 1 puff into the lungs daily. 60 each 0   folic acid (FOLVITE) 800 MCG tablet Take 800 mcg by mouth daily.     furosemide (LASIX) 40 MG tablet Take 1 tablet (40 mg total) by mouth as directed. Take 1 tablet daily. May take additional 1 tablet If weight gain of greater than 2 pounds in 24 hours or 5 pounds in one week. 90 tablet 3   glucose blood (ONETOUCH ULTRA) test strip TEST TWICE DAILY AS DIRECTED 100 each 11   ipratropium-albuterol (DUONEB)  0.5-2.5 (3) MG/3ML SOLN Take 3 mLs by nebulization every 6 (six) hours as needed.     Lancets (ONETOUCH ULTRASOFT) lancets Use as instructed 100 each 12   levocetirizine (XYZAL) 5 MG tablet TAKE ONE TABLET BY MOUTH AT BEDTIME AS NEEDED FOR ALLERGIES 90 tablet 3   levothyroxine (SYNTHROID) 137 MCG tablet Take 1 tablet (137 mcg total) by mouth daily before breakfast. 30 minutes do not mix with vitamins or acid reflux medications D/c 150 mcg 90 tablet 3   Loperamide-Simethicone 2-125 MG TABS Take 1 tablet by mouth daily as needed. May repeat in 1 hour     losartan (COZAAR) 50 MG tablet TAKE 1 TABLET BY MOUTH DAILY 90 tablet 3   Magnesium ([employer] TRIPLE MAGNESIUM COMPLEX) 400 MG CAPS Take 1 capsule by mouth daily.     Misc. Devices (TUB TRANSFER BOARD) MISC 1 Device by Does not apply route as needed. 1 each 0   montelukast (SINGULAIR) 10 MG tablet Take 1 tablet (10 mg total) by mouth at bedtime. 90 tablet 3   Multiple Vitamin (MULTIVITAMIN WITH MINERALS) TABS tablet Take 1 tablet by mouth daily.     Multiple Vitamins-Minerals (HAIR SKIN & NAILS) TABS Take 1 tablet by mouth daily.     naloxone (NARCAN) nasal spray 4 mg/0.1 mL Place 1 spray into the nose as needed for up to 365 doses (for opioid-induced respiratory depresssion). In case of emergency (overdose), spray once into each nostril. If no response within 3 minutes, repeat application and call 911. 1 each 0   nystatin (MYCOSTATIN/NYSTOP) powder Apply 1 Application topically 3 (three) times daily. 120 g 3   nystatin ointment (MYCOSTATIN) Apply 1 Application topically 2 (two) times daily. 60 g 3   ondansetron (ZOFRAN) 4 MG tablet Take 1 tablet (4 mg total) by mouth every 8 (eight) hours as needed for nausea or vomiting. 90 tablet 1   oxyCODONE (OXY IR/ROXICODONE) 5 MG immediate release tablet Take 1-2 tablets (5-10 mg total) by mouth every 4 (four) hours as needed for moderate pain (pain score 4-6). 30 tablet 0   pantoprazole (PROTONIX) 40 MG tablet Take  1 tablet (40 mg total) by mouth daily. 30 min before food 90 tablet 3   Powders (GOLD BOND NO MESS BODY POWDER) AERP Apply 1 application. topically daily as needed. Gold bond talc free powder 200 g 11   pregabalin (LYRICA) 50 MG capsule TAKE 1 CAPSULE BY MOUTH 3 TIMES DAILY 90 capsule 2   rosuvastatin (CRESTOR) 20 MG tablet TAKE 1 TABLET BY MOUTH DAILY (Patient taking differently: Take 20 mg by mouth at bedtime.) 90  tablet 1   senna-docusate (SENOKOT-S) 8.6-50 MG tablet Take 1 tablet by mouth at bedtime as needed for mild constipation. 10 tablet 0   sucralfate (CARAFATE) 1 GM/10ML suspension Take 1 g by mouth 3 (three) times daily.      Vibegron (GEMTESA) 75 MG TABS Take 1 tablet (75 mg total) by mouth daily. 30 tablet 11   No current facility-administered medications on file prior to visit.     ROS see history of present illness  Objective  Physical Exam Vitals:   10/07/22 1121  Pulse: 64  Temp: 97.7 F (36.5 C)  SpO2: 96%    BP Readings from Last 3 Encounters:  09/07/22 112/70  08/26/22 (!) 153/90  08/19/22 121/78   Wt Readings from Last 3 Encounters:  10/07/22 251 lb 12.8 oz (114.2 kg)  09/01/22 267 lb (121.1 kg)  08/26/22 267 lb (121.1 kg)    Physical Exam Constitutional:      General: She is not in acute distress.    Appearance: Normal appearance.  HENT:     Head: Normocephalic.  Cardiovascular:     Rate and Rhythm: Normal rate. Rhythm irregularly irregular.     Heart sounds: Normal heart sounds.  Pulmonary:     Effort: Pulmonary effort is normal.     Breath sounds: Normal breath sounds.  Skin:    General: Skin is warm and dry.  Neurological:     General: No focal deficit present.     Mental Status: She is alert.  Psychiatric:        Mood and Affect: Mood normal.        Behavior: Behavior normal.    Assessment/Plan: Please see individual problem list.  Status post reverse total replacement of left shoulder Assessment & Plan: Surgery 09/01/2022. Doing  well. Home from rehab facility on 09/24/2022. Home health nursing and OT to begin this afternoon. She will continue therapies as ordered. Follow up with surgeon next week as scheduled.    Dysuria Assessment & Plan: Patient unable to void today in office. She will return tomorrow with a urine sample. Microscopic and culture pending. Will contact patient with results. Advised adequate fluid intake.   Orders: -     Urinalysis, Routine w reflex microscopic; Future -     Urine Culture; Future  Pressure ulcer of left heel, stage 3 (HCC) Assessment & Plan: Continue follow up weekly with wound care.    Pressure injury of deep tissue of right heel Assessment & Plan: Continue follow up weekly with wound care.    Type 2 diabetes mellitus with hyperglycemia, without long-term current use of insulin (HCC) Assessment & Plan: Chronic. Stable on Mounjaro 10 mg weekly. Continue. She would like to increase after completing 4 weeks of 10 mg, will send Rx for 12.5 mg.   Orders: -     Tirzepatide; Inject 12.5 mg into the skin once a week.  Dispense: 6 mL; Refill: 0   Return in about 3 months (around 01/07/2023) for Follow up.   [name], NP-C [facility] [employer]

## 2022-10-08 ENCOUNTER — Other Ambulatory Visit: Payer: Self-pay | Admitting: Cardiovascular Disease

## 2022-10-08 ENCOUNTER — Encounter: Payer: Self-pay | Admitting: Nurse Practitioner

## 2022-10-08 ENCOUNTER — Encounter: Payer: Medicare HMO | Admitting: Physician Assistant

## 2022-10-08 ENCOUNTER — Ambulatory Visit: Payer: Medicare HMO | Admitting: Pulmonary Disease

## 2022-10-08 DIAGNOSIS — L89623 Pressure ulcer of left heel, stage 3: Secondary | ICD-10-CM | POA: Diagnosis not present

## 2022-10-08 DIAGNOSIS — L89616 Pressure-induced deep tissue damage of right heel: Secondary | ICD-10-CM | POA: Diagnosis not present

## 2022-10-08 DIAGNOSIS — I480 Paroxysmal atrial fibrillation: Secondary | ICD-10-CM | POA: Diagnosis not present

## 2022-10-08 DIAGNOSIS — L89323 Pressure ulcer of left buttock, stage 3: Secondary | ICD-10-CM | POA: Diagnosis not present

## 2022-10-08 DIAGNOSIS — I5033 Acute on chronic diastolic (congestive) heart failure: Secondary | ICD-10-CM

## 2022-10-08 DIAGNOSIS — Z7901 Long term (current) use of anticoagulants: Secondary | ICD-10-CM | POA: Diagnosis not present

## 2022-10-08 DIAGNOSIS — I87323 Chronic venous hypertension (idiopathic) with inflammation of bilateral lower extremity: Secondary | ICD-10-CM | POA: Diagnosis not present

## 2022-10-08 NOTE — Progress Notes (Addendum)
Dawn, [name] ([id number]) [id number] Page 1 of 10 Visit Report for 10/08/2022 Chief Complaint Document Details Patient Name: Date of Service: Dawn [name] 10/08/2022 2:45 PM Medical Record Number: [id number] Patient Account Number: [id number] Date of Birth/Sex: Treating RN: [birth year] (72 y.o. [name], [name] Primary Care Provider: [name] Other Clinician: Referring Provider: Treating Provider/Extender: Algis Greenhouse Weeks in Treatment: 1 Information Obtained from: Patient Chief Complaint Bilateral heel ulcers Electronic Signature(s) Signed: 10/08/2022 3:01:05 PM By: Allen Derry PA-C Entered By: Allen Derry on 10/08/2022 15:01:05 -------------------------------------------------------------------------------- Debridement Details Patient Name: Date of Service: Dawn [name], [place] 10/08/2022 2:45 PM Medical Record Number: [id number] Patient Account Number: [id number] Date of Birth/Sex: Treating RN: [birth year] (71 y.o. [name], [name] Primary Care Provider: [name] Other Clinician: Referring Provider: Treating Provider/Extender: Algis Greenhouse Weeks in Treatment: 1 Debridement Performed for Assessment: Wound #24 Left Calcaneus Performed By: Physician Allen Derry, PA-C Debridement Type: Debridement Level of Consciousness (Pre-procedure): Awake and Alert Pre-procedure Verification/Time Out Yes - 15:15 Taken: Start Time: 15:15 Pain Control: Lidocaine 2% T opical Gel Percent of Wound Bed Debrided: 100% T Area Debrided (cm): otal 1.77 Tissue and other material debrided: Non-Viable, Slough, Subcutaneous, Slough Level: Skin/Subcutaneous Tissue Debridement Description: Excisional Instrument: Curette Bleeding: Minimum Hemostasis Achieved: Pressure End Time: 15:17 Procedural Pain: 0 Post Procedural Pain: 0 Dawn, [name] [name] ([id number]) [id number] Page 2 of 10 Response to Treatment:  Procedure was tolerated well Level of Consciousness (Post- Awake and Alert procedure): Post Debridement Measurements of Total Wound Length: (cm) 1.5 Stage: Category/Stage III Width: (cm) 1.5 Depth: (cm) 0.2 Volume: (cm) 0.353 Character of Wound/Ulcer Post Debridement: Stable Post Procedure Diagnosis Same as Pre-procedure Electronic Signature(s) Signed: 10/13/2022 3:20:29 PM By: Bonnell Public Signed: 10/19/2022 3:00:23 PM By: Allen Derry PA-C Entered By: Bonnell Public on 10/08/2022 15:18:10 -------------------------------------------------------------------------------- HPI Details Patient Name: Date of Service: Dawn [name], [name] [name]. 10/08/2022 2:45 PM Medical Record Number: [id number] Patient Account Number: [id number] Date of Birth/Sex: Treating RN: Apr 08, 1950 (72 y.o. [name], [name] Primary Care Provider: [name] Other Clinician: Referring Provider: Treating Provider/Extender: Algis Greenhouse Weeks in Treatment: 1 History of Present Illness HPI Description: 06/28/20 upon evaluation today patient appears to be doing unfortunately somewhat poorly in regard to wounds that she has over her lower extremities. She has a wound on the left distal second toe, left posterior heel, and right anterior lower leg. With that being said all in all none of the wounds appear to be too bad the one that hurts her the most is actually the wound on the posterior heel which actually is also one of the smallest. Nonetheless I think there is some callus hiding what is really going on in this area. Fortunately there does not appear to be any obvious evidence of infection which is great news. Patient does have a history of diabetes mellitus type 2, chronic venous insufficiency, hypertension, history of DVT and is on long-term anticoagulant s, therapy, Eliquis. 07/05/20 upon evaluation today patient appears to be doing well currently with regard to her wounds. I feel like she is making progress which  is great news and overall there is no signs of active infection at this time. No fevers, chills, nausea, vomiting, or diarrhea. 4/29; patient has a only a small area remaining on the tip of her left second toe and a small area remaining on the left heel. The area on the right anterior lower leg is healed. They tell me that she  had remote podiatric surgery on the toes of the left foot however they are very fixed in position and I wonder whether they are making it difficult to relieve pressure in her foot wear. She is a type II diabetic with neuropathy as well. 08/01/2020 upon evaluation today patient appears to be doing well currently in regard to her wounds. In fact everything appears to be doing much better. The posterior aspect of her heel is actually giving her some trouble not the Achilles area but a small wound that we did not even have marked. In fact I had noted this before. Nonetheless we are going to addressed that today. 5/27; most of the patient's wounds are healed including the left Achilles heel left toe. Right forearm is also healed. She has a new abrasion posteriorly in the right elbow area just above the olecranon this happened today with an abrasion from her car door 6/15; the patient's wounds on the left Achilles and left second toe remain healed. The area on the right forearm is also just about healed in fact the skin I replaced last time is looking viable which is good. She had me look at the left heel again which is not in the area of the wound more towards the tip of the heel at the Achilles insertion site as well as the tip of the left foot second toe. In the Achilles area that is clearly is friction probably in bed at night. Not really near where the wound was. The left second toe remains healed although this is hammered and overhanging first toe also puts pressure in this area 09/13/2020 upon evaluation today patient appears to be doing well with regard to all previous wounds  that she had. Everything is completely healed. With that being said I do think that the patient unfortunately has a new skin tear on the right forearm which is due to her put a Band-Aid on it and then when it pulled off this caused some issues with skin tearing. Nonetheless I think that this needs to be addressed today. Hopefully will take too long to get this to heal. She is having some issues with pain in her heel but I think this is more related to be honest to her neuropathy as opposed to anything else. Readmission: 10/30/2020 upon evaluation today patient appears to be doing poorly in regard to her legs bilaterally. She is also having an issue with her fourth toe on the left. Fortunately there does not appear to be any signs of active infection at this time. No fevers, chills, nausea, vomiting, or diarrhea. It is actually been quite a while since we have seen her. In fact June 24 was the last time that she was here in the clinic. She tells me she did not come back because things were just doing "pretty well". 11/29/2020 upon evaluation today patient actually appears to be doing well with regard to her arms those are completely healed. Her right leg is still open but Dawn [name], Dawn [name] ([id number]) [id number] Page 3 of 10 doing okay. The fourth toe of the left foot does show some signs here of having some issues with necrotic tissue of an open wound. This is where a toenail was removed by podiatry. Subsequently I do think that we need to go ahead and see what we can do about getting this cleared away so being try to get some healing going forward. 12/13/2020 upon evaluation today patient appears to be doing decently well in regard to  her wound on the right leg. In general I think that she is making good progress here. Unfortunately she continues to have significant issues with swelling in the left leg is now even more swollen at this point. For that reason I do believe she  would be benefited by wrapping both sides although she is not interested in doing this. Overall I think that she is definitely making some good progress here nonetheless in regard to the right leg left leg leave some to be desired. 12/27/2020 upon evaluation today patient appears to be doing well with regard to her wound in regard to the legs. Unfortunately the toe was not doing nearly as well. I am much more concerned about infection and osteomyelitis here. She had the MRI but right now were not seeing obvious evidence of redness spreading up to her foot this is good news I do not have the result of the MRI as of yet. I am good to place her on doxycycline however due to the fact that the wound does seem to be getting a little worse in regard to her toe. 01/14/2021 upon evaluation today patient appears to be doing well with regard to her ulcers. The right leg is doing well and even the toe seems to be doing well though she still has some evidence of bone exposed on the toe which does not appear to be doing quite as good as I would like to see just and the fact that is still not covered over new tissue but looks tremendously better compared to 2 weeks ago. Overall very pleased in that regard. 01/28/2021 upon evaluation today patient's leg on the left actually appears to have new wounds this is due to her deciding to shave yesterday. Fortunately there does not appear to be any signs of infection currently. This is good news. Nonetheless I do believe that with these new areas open it would be best to wrap her leg to try to make sure we get this under control sooner rather than later. With regard to her toe there still does appear to be some evidence of bone exposed this was covered over with callus I did remove that today. Nonetheless I think this is significantly smaller than previous. 02/11/2021 upon evaluation today patient appears to be doing okay in regard to her leg ulcers. I do not see anything  obvious at this point this seems to be a complicating factor. I think that she is getting better. Were wrap of the right leg not the left leg. With that being said the left fourth toe ulcer still does have evidence of being open there is some drainage still on top of the wound I think we can to try to see about using something like a collagen dressing today to see if that would be of benefit for her. She is in agreement with giving this a trial. Nonetheless I am concerned about the fact that she continues to have issues here with this not healing it is confirmed chronic osteomyelitis she has been on antibiotics. I am going to extend those today as well. That is Keflex and that will be for 1 additional month. Nonetheless if she is not doing significantly better by the next time I see her I think that the biggest thing is going to be for [place] to go ahead and see what we can do about getting her started with hyperbaric oxygen therapy to try to save the toe. 02/20/2021 upon evaluation today patient presents for follow-up  she actually came in a little bit early as the home health nurse was concerned about her toe becoming "gangrenous". With that being said the good news is there is [place] no evidence of this and in fact the toe seems to be doing better at this point. What was over the toe was an area that had bled significantly and then subsequently dried making it look much worse than where things actually stand. There is just a very small opening remaining in the toe. In regard to her legs everything appears to be healed bilaterally. 02/24/2021 upon evaluation today patient appears to be doing well with regard to her toe ulcer. I am actually very pleased with where we stand and I think that she is making good progress at this time. There is still a very small opening although I think the biggest issue is the collagen seems to be getting stuck and drying up because the wound is so small we probably just need to  use something to keep this moist like a little piece of Xeroform gauze and see if we get this to close. 03/07/2021 upon evaluation patient actually appears to be making excellent progress in regard to her last wound on the toe. There is just a very tiny potential area that I can even be sure at first until I get some of the dry skin off when I did get this removed there was just a very tiny pinpoint opening that was barely even moist. Actually feel like this is very close to complete resolution. She has been using the Xeroform that is doing great. 03/25/2021 upon evaluation today patient appears to be doing well with regard to her toe ulcer. In fact this appears to be potentially completely healed. Fortunately I do not see anything draining at this point which is great news. She does have a small blood blister that is already drying up and looking okay on the third toe as well I am not sure what happened here she has an either. Otherwise she is having some weeping from the right leg this again is newer she tells me from last week weighing in that her pulmonologist and then today weighing in at the hematologist that she gained 30 pounds. This is quite significant if indeed true and I think she is to contact her primary care provider ASAP. This would account for why her right leg is weeping as well. 04/04/2020 upon evaluation today patient appears to be doing excellent in regard to her toe which I actually think is healed today. With that being said she unfortunately is having issues with her leg swelling and again several blisters on each leg that have reopened unfortunately. 05/02/2021 upon evaluation today patient appears to be doing well with regard to her wounds. In fact is down just 1 on the anterior portion of her right leg which is actually from a blister. Otherwise she is doing awesome. 05/15/2021 upon evaluation today patient appears to be doing better in regard to her leg ulcer although she has a  small irritated area from tape on the leg medially. She also has a skin tear to her right elbow region. Fortunately there does not appear to be any signs of active infection locally nor systemically at this point which is great news. No fevers, chills, nausea, vomiting, or diarrhea. 05/26/2021 upon evaluation today patient appears to be doing better in regard to the actual wounds although she is having a lot of leaking from the right leg. She is  also been using her lymphedema pumps along with wearing her compression. Nonetheless she is having a lot of trouble with her breathing I am concerned that the pumps may be contributing to this. 06/12/2021 upon evaluation today patient appears to be doing better in regard to a lot of things including her breathing as well as her legs. She still has a lot of swelling but the good news is this is better she lost about 20 pounds of fluid when she went to the hospital and they diuresed her. Subsequently this is good news. Nonetheless she actually told me that "if she ever gets that bad again for me to yell at her to go to the hospital". She said that I was not quite forceful enough and telling her what she needed to do. She also notes she will try to be more receptive in the future. 06/19/2021 upon evaluation today patient appears to be doing well with regard to her wound. She is tolerating the dressing changes without complication. Fortunately her legs on the right are completely healed. She did have an area that she hit her Achilles on the left but this too seems to be pretty much closed at this point. Readmission: 02-05-2022 upon evaluation today patient appears to be doing well currently in regard to her left leg although her right leg is actually giving her quite a bit more trouble at this point. Fortunately there does not appear to be any signs of infection locally or systemically which is great news. No fevers, chills, nausea, vomiting, or diarrhea. Patient  tells me that she began having issues more recently with her legs in the right leg is definitely worse than the left as far as the way she feels as well and she states it was around September 16 that she first noted this and then when she called it took about 2-1/2 weeks to get in to see me. Her past medical history really has not changed since I saw her back in March everything is pretty much about the same repeat ABIs appear to be doing well currently at 1.1 on the left and 1.12 on the right. 02-10-2022 upon evaluation today patient actually appears to be doing well. Culture actually showed evidence of Pseudomonas but her legs look amazing today I do not see any signs of infection I do not believe that we need to add anything especially in light of the fact there is a lot of interactions with anything to treat the Pseudomonas in her current medication regimen. For that reason we will just hold with where we stand and continue with the current therapy. 02-24-2022 upon evaluation today patient appears to be doing excellent in fact she appears to be completely healed based on what we are seeing. Readmission: Dawn [name], Dawn [name] ([id number]) [id number] Page 4 of 10 10-01-2022 upon evaluation today patient presents for follow-up evaluation here in the clinic she has not been seen since December 2023. At that point she was actually being seen for her legs that is actually doing much better which is great news. Overall very pleased with her leg situation and she has been doing great with her compression socks. With that being said unfortunately she does seem to be having issues with her heel this was secondary to having her left shoulder replacement and subsequent to that she was in the hospital for 6 days during that time she developed a pressure wound on the left heel. Unfortunately that is now it is giving her the most trouble  she also has some irritation of the right heel but is  not nearly as significant as far as that is concerned there is no signs of active infection at this time which is great news and very pleased in that regard. 10-09-2022 upon evaluation today patient appears to be doing well currently in regard to her heel we are definitely seeing signs of improvement which is great news and in general I think that we are moving in the right direction. I do not see any evidence of worsening overall which is great news. Electronic Signature(s) Signed: 10/08/2022 3:31:33 PM By: Allen Derry PA-C Entered By: Allen Derry on 10/08/2022 15:31:32 -------------------------------------------------------------------------------- Physical Exam Details Patient Name: Date of Service: Dawn [name] 10/08/2022 2:45 PM Medical Record Number: [id number] Patient Account Number: [id number] Date of Birth/Sex: Treating RN: 12/19/1950 (72 y.o. [name], [name] Primary Care Provider: [name] Other Clinician: Referring Provider: Treating Provider/Extender: Algis Greenhouse Weeks in Treatment: 1 Constitutional Well-nourished and well-hydrated in no acute distress. Respiratory normal breathing without difficulty. Psychiatric this patient is able to make decisions and demonstrates good insight into disease process. Alert and Oriented x 3. pleasant and cooperative. Notes Upon inspection patient's wound bed did require sharp debridement clearway necrotic debris she tolerated the debridement today without complication postdebridement wound bed appears to be significantly improved. Electronic Signature(s) Signed: 10/08/2022 3:31:49 PM By: Allen Derry PA-C Entered By: Allen Derry on 10/08/2022 15:31:49 -------------------------------------------------------------------------------- Physician Orders Details Patient Name: Date of Service: Dawn [name], [place] 10/08/2022 2:45 PM Medical Record Number: [id number] Patient Account Number: [id number] Date of Birth/Sex: Treating  RN: 1950/08/05 (72 y.o. [name], [name] Primary Care Provider: [name] Other Clinician: Referring Provider: Treating Provider/Extender: Algis Greenhouse [place], Gala Romney ([id number]) [id number] Page 5 of 10 Weeks in Treatment: 1 Verbal / Phone Orders: No Diagnosis Coding ICD-10 Coding Code Description E11.621 Type 2 diabetes mellitus with foot ulcer I87.323 Chronic venous hypertension (idiopathic) with inflammation of bilateral lower extremity L89.623 Pressure ulcer of left heel, stage 3 L89.616 Pressure-induced deep tissue damage of right heel Z79.01 Long term (current) use of anticoagulants I48.0 Paroxysmal atrial fibrillation Follow-up Appointments Return Appointment in 1 week. [facility] for wound care. May utilize formulary equivalent dressing for wound treatment orders unless otherwise specified. Home Health Nurse may visit PRN to address patients wound care needs. Rolene Arbour for wound care to left calcan [phone] Anesthetic (Use 'Patient Medications' Section for Anesthetic Order Entry) Lidocaine applied to wound bed Edema Control - Lymphedema / Segmental Compressive Device / Other Elevate, Exercise Daily and A void Standing for Long Periods of Time. Elevate legs to the level of the heart and pump ankles as often as possible Elevate leg(s) parallel to the floor when sitting. Wound Treatment Wound #24 - Calcaneus Wound Laterality: Left Cleanser: Normal Saline 3 x Per Week/30 Days Discharge Instructions: Wash your hands with soap and water. Remove old dressing, discard into plastic bag and place into trash. Cleanse the wound with Normal Saline prior to applying a clean dressing using gauze sponges, not tissues or cotton balls. Do not scrub or use excessive force. Pat dry using gauze sponges, not tissue or cotton balls. Cleanser: Soap and Water 3 x Per Week/30 Days Discharge Instructions: Gently cleanse wound  with antibacterial soap, rinse and pat dry prior to dressing wounds Prim Dressing: Promogran Matrix 4.34 (in) 3 x Per Week/30 Days ary Discharge Instructions: Moisten w/normal saline or sterile water; Cover wound as  directed. Do not remove from wound bed. Secondary Dressing: (BORDER) Zetuvit Plus SILICONE BORDER Dressing 4x4 (in/in) 3 x Per Week/30 Days Discharge Instructions: Please do not put silicone bordered dressings under wraps. Use non-bordered dressing only. Electronic Signature(s) Signed: 10/13/2022 3:20:29 PM By: Bonnell Public Signed: 10/19/2022 3:00:23 PM By: Allen Derry PA-C Entered By: Bonnell Public on 10/08/2022 15:19:19 -------------------------------------------------------------------------------- Problem List Details Patient Name: Date of Service: Dawn [name], [place] 10/08/2022 2:45 PM Medical Record Number: [id number] Patient Account Number: [id number] Date of Birth/Sex: Treating RN: 1951-03-02 (72 y.o. [name], [name] Primary Care Provider: [name] Other Clinician: Referring Provider: Treating Provider/Extender: Amedeo Kinsman in Treatment: 1 Dawn [name], Dawn [name] ([id number]) [id number] Page 6 of 10 Active Problems ICD-10 Encounter Code Description Active Date MDM Diagnosis E11.621 Type 2 diabetes mellitus with foot ulcer 10/01/2022 No Yes I87.323 Chronic venous hypertension (idiopathic) with inflammation of bilateral lower 10/01/2022 No Yes extremity L89.623 Pressure ulcer of left heel, stage 3 10/01/2022 No Yes L89.616 Pressure-induced deep tissue damage of right heel 10/01/2022 No Yes Z79.01 Long term (current) use of anticoagulants 10/01/2022 No Yes I48.0 Paroxysmal atrial fibrillation 10/01/2022 No Yes Inactive Problems Resolved Problems Electronic Signature(s) Signed: 10/08/2022 3:01:02 PM By: Allen Derry PA-C Entered By: Allen Derry on 10/08/2022  15:01:02 -------------------------------------------------------------------------------- Progress Note Details Patient Name: Date of Service: Dawn [name] 10/08/2022 2:45 PM Medical Record Number: [id number] Patient Account Number: [id number] Date of Birth/Sex: Treating RN: [birth year] (72 y.o. [name], [name] Primary Care Provider: [name] Other Clinician: Referring Provider: Treating Provider/Extender: Algis Greenhouse Weeks in Treatment: 1 Subjective Chief Complaint Information obtained from Patient Bilateral heel ulcers History of Present Illness (HPI) 06/28/20 upon evaluation today patient appears to be doing unfortunately somewhat poorly in regard to wounds that she has over her lower extremities. She has a wound on the left distal second toe, left posterior heel, and right anterior lower leg. With that being said all in all none of the wounds appear to be too bad the one that hurts her the most is actually the wound on the posterior heel which actually is also one of the smallest. Nonetheless I think there is some callus hiding what is really going on in this area. Fortunately there does not appear to be any obvious evidence of infection which is great news. Patient does have a history of diabetes mellitus type 2, chronic venous insufficiency, hypertension, history of DVT and is on long-term anticoagulant therapy, Eliquis. Dawn [name], Dawn [name] ([id number]) [id number] Page 7 of 10 07/05/20 upon evaluation today patient appears to be doing well currently with regard to her wounds. I feel like she is making progress which is great news and overall there is no signs of active infection at this time. No fevers, chills, nausea, vomiting, or diarrhea. 4/29; patient has a only a small area remaining on the tip of her left second toe and a small area remaining on the left heel. The area on the right anterior lower leg is healed. They tell me that  she had remote podiatric surgery on the toes of the left foot however they are very fixed in position and I wonder whether they are making it difficult to relieve pressure in her foot wear. She is a type II diabetic with neuropathy as well. 08/01/2020 upon evaluation today patient appears to be doing well currently in regard to her wounds. In fact everything appears to be doing much better. The posterior aspect of her heel  is actually giving her some trouble not the Achilles area but a small wound that we did not even have marked. In fact I had noted this before. Nonetheless we are going to addressed that today. 5/27; most of the patient's wounds are healed including the left Achilles heel left toe. Right forearm is also healed. She has a new abrasion posteriorly in the right elbow area just above the olecranon this happened today with an abrasion from her car door 6/15; the patient's wounds on the left Achilles and left second toe remain healed. The area on the right forearm is also just about healed in fact the skin I replaced last time is looking viable which is good. She had me look at the left heel again which is not in the area of the wound more towards the tip of the heel at the Achilles insertion site as well as the tip of the left foot second toe. In the Achilles area that is clearly is friction probably in bed at night. Not really near where the wound was. The left second toe remains healed although this is hammered and overhanging first toe also puts pressure in this area 09/13/2020 upon evaluation today patient appears to be doing well with regard to all previous wounds that she had. Everything is completely healed. With that being said I do think that the patient unfortunately has a new skin tear on the right forearm which is due to her put a Band-Aid on it and then when it pulled off this caused some issues with skin tearing. Nonetheless I think that this needs to be addressed today.  Hopefully will take too long to get this to heal. She is having some issues with pain in her heel but I think this is more related to be honest to her neuropathy as opposed to anything else. Readmission: 10/30/2020 upon evaluation today patient appears to be doing poorly in regard to her legs bilaterally. She is also having an issue with her fourth toe on the left. Fortunately there does not appear to be any signs of active infection at this time. No fevers, chills, nausea, vomiting, or diarrhea. It is actually been quite a while since we have seen her. In fact June 24 was the last time that she was here in the clinic. She tells me she did not come back because things were just doing "pretty well". 11/29/2020 upon evaluation today patient actually appears to be doing well with regard to her arms those are completely healed. Her right leg is still open but doing okay. The fourth toe of the left foot does show some signs here of having some issues with necrotic tissue of an open wound. This is where a toenail was removed by podiatry. Subsequently I do think that we need to go ahead and see what we can do about getting this cleared away so being try to get some healing going forward. 12/13/2020 upon evaluation today patient appears to be doing decently well in regard to her wound on the right leg. In general I think that she is making good progress here. Unfortunately she continues to have significant issues with swelling in the left leg is now even more swollen at this point. For that reason I do believe she would be benefited by wrapping both sides although she is not interested in doing this. Overall I think that she is definitely making some good progress here nonetheless in regard to the right leg left leg leave some to  be desired. 12/27/2020 upon evaluation today patient appears to be doing well with regard to her wound in regard to the legs. Unfortunately the toe was not doing nearly as well. I am  much more concerned about infection and osteomyelitis here. She had the MRI but right now were not seeing obvious evidence of redness spreading up to her foot this is good news I do not have the result of the MRI as of yet. I am good to place her on doxycycline however due to the fact that the wound does seem to be getting a little worse in regard to her toe. 01/14/2021 upon evaluation today patient appears to be doing well with regard to her ulcers. The right leg is doing well and even the toe seems to be doing well though she still has some evidence of bone exposed on the toe which does not appear to be doing quite as good as I would like to see just and the fact that is still not covered over new tissue but looks tremendously better compared to 2 weeks ago. Overall very pleased in that regard. 01/28/2021 upon evaluation today patient's leg on the left actually appears to have new wounds this is due to her deciding to shave yesterday. Fortunately there does not appear to be any signs of infection currently. This is good news. Nonetheless I do believe that with these new areas open it would be best to wrap her leg to try to make sure we get this under control sooner rather than later. With regard to her toe there still does appear to be some evidence of bone exposed this was covered over with callus I did remove that today. Nonetheless I think this is significantly smaller than previous. 02/11/2021 upon evaluation today patient appears to be doing okay in regard to her leg ulcers. I do not see anything obvious at this point this seems to be a complicating factor. I think that she is getting better. Were wrap of the right leg not the left leg. With that being said the left fourth toe ulcer still does have evidence of being open there is some drainage still on top of the wound I think we can to try to see about using something like a collagen dressing today to see if that would be of benefit for her. She  is in agreement with giving this a trial. Nonetheless I am concerned about the fact that she continues to have issues here with this not healing it is confirmed chronic osteomyelitis she has been on antibiotics. I am going to extend those today as well. That is Keflex and that will be for 1 additional month. Nonetheless if she is not doing significantly better by the next time I see her I think that the biggest thing is going to be for [place] to go ahead and see what we can do about getting her started with hyperbaric oxygen therapy to try to save the toe. 02/20/2021 upon evaluation today patient presents for follow-up she actually came in a little bit early as the home health nurse was concerned about her toe becoming "gangrenous". With that being said the good news is there is [place] no evidence of this and in fact the toe seems to be doing better at this point. What was over the toe was an area that had bled significantly and then subsequently dried making it look much worse than where things actually stand. There is just a very small opening remaining in the  toe. In regard to her legs everything appears to be healed bilaterally. 02/24/2021 upon evaluation today patient appears to be doing well with regard to her toe ulcer. I am actually very pleased with where we stand and I think that she is making good progress at this time. There is still a very small opening although I think the biggest issue is the collagen seems to be getting stuck and drying up because the wound is so small we probably just need to use something to keep this moist like a little piece of Xeroform gauze and see if we get this to close. 03/07/2021 upon evaluation patient actually appears to be making excellent progress in regard to her last wound on the toe. There is just a very tiny potential area that I can even be sure at first until I get some of the dry skin off when I did get this removed there was just a very tiny pinpoint  opening that was barely even moist. Actually feel like this is very close to complete resolution. She has been using the Xeroform that is doing great. 03/25/2021 upon evaluation today patient appears to be doing well with regard to her toe ulcer. In fact this appears to be potentially completely healed. Fortunately I do not see anything draining at this point which is great news. She does have a small blood blister that is already drying up and looking okay on the third toe as well I am not sure what happened here she has an either. Otherwise she is having some weeping from the right leg this again is newer she tells me from last week weighing in that her pulmonologist and then today weighing in at the hematologist that she gained 30 pounds. This is quite significant if indeed true and I think she is to contact her primary care provider ASAP. This would account for why her right leg is weeping as well. 04/04/2020 upon evaluation today patient appears to be doing excellent in regard to her toe which I actually think is healed today. With that being said she unfortunately is having issues with her leg swelling and again several blisters on each leg that have reopened unfortunately. 05/02/2021 upon evaluation today patient appears to be doing well with regard to her wounds. In fact is down just 1 on the anterior portion of her right leg which is actually from a blister. Otherwise she is doing awesome. 05/15/2021 upon evaluation today patient appears to be doing better in regard to her leg ulcer although she has a small irritated area from tape on the leg medially. She also has a skin tear to her right elbow region. Fortunately there does not appear to be any signs of active infection locally nor systemically at this point which is great news. No fevers, chills, nausea, vomiting, or diarrhea. 05/26/2021 upon evaluation today patient appears to be doing better in regard to the actual wounds although she is having  a lot of leaking from the right leg. She is also been using her lymphedema pumps along with wearing her compression. Nonetheless she is having a lot of trouble with her breathing I am concerned Dawn [name], Dawn [name] ([id number]) [id number] Page 8 of 10 that the pumps may be contributing to this. 06/12/2021 upon evaluation today patient appears to be doing better in regard to a lot of things including her breathing as well as her legs. She still has a lot of swelling but the good news is this is better she lost  about 20 pounds of fluid when she went to the hospital and they diuresed her. Subsequently this is good news. Nonetheless she actually told me that "if she ever gets that bad again for me to yell at her to go to the hospital". She said that I was not quite forceful enough and telling her what she needed to do. She also notes she will try to be more receptive in the future. 06/19/2021 upon evaluation today patient appears to be doing well with regard to her wound. She is tolerating the dressing changes without complication. Fortunately her legs on the right are completely healed. She did have an area that she hit her Achilles on the left but this too seems to be pretty much closed at this point. Readmission: 02-05-2022 upon evaluation today patient appears to be doing well currently in regard to her left leg although her right leg is actually giving her quite a bit more trouble at this point. Fortunately there does not appear to be any signs of infection locally or systemically which is great news. No fevers, chills, nausea, vomiting, or diarrhea. Patient tells me that she began having issues more recently with her legs in the right leg is definitely worse than the left as far as the way she feels as well and she states it was around September 16 that she first noted this and then when she called it took about 2-1/2 weeks to get in to see me. Her past medical history really  has not changed since I saw her back in March everything is pretty much about the same repeat ABIs appear to be doing well currently at 1.1 on the left and 1.12 on the right. 02-10-2022 upon evaluation today patient actually appears to be doing well. Culture actually showed evidence of Pseudomonas but her legs look amazing today I do not see any signs of infection I do not believe that we need to add anything especially in light of the fact there is a lot of interactions with anything to treat the Pseudomonas in her current medication regimen. For that reason we will just hold with where we stand and continue with the current therapy. 02-24-2022 upon evaluation today patient appears to be doing excellent in fact she appears to be completely healed based on what we are seeing. Readmission: 10-01-2022 upon evaluation today patient presents for follow-up evaluation here in the clinic she has not been seen since December 2023. At that point she was actually being seen for her legs that is actually doing much better which is great news. Overall very pleased with her leg situation and she has been doing great with her compression socks. With that being said unfortunately she does seem to be having issues with her heel this was secondary to having her left shoulder replacement and subsequent to that she was in the hospital for 6 days during that time she developed a pressure wound on the left heel. Unfortunately that is now it is giving her the most trouble she also has some irritation of the right heel but is not nearly as significant as far as that is concerned there is no signs of active infection at this time which is great news and very pleased in that regard. 10-09-2022 upon evaluation today patient appears to be doing well currently in regard to her heel we are definitely seeing signs of improvement which is great news and in general I think that we are moving in the right direction. I do not  see any  evidence of worsening overall which is great news. Objective Constitutional Well-nourished and well-hydrated in no acute distress. Vitals Time Taken: 3:00 PM, Height: 64 in, Weight: 250 lbs, BMI: 42.9, Temperature: 97.7 F, Pulse: 80 bpm, Respiratory Rate: 18 breaths/min, Blood Pressure: 118/75 mmHg. Respiratory normal breathing without difficulty. Psychiatric this patient is able to make decisions and demonstrates good insight into disease process. Alert and Oriented x 3. pleasant and cooperative. General Notes: Upon inspection patient's wound bed did require sharp debridement clearway necrotic debris she tolerated the debridement today without complication postdebridement wound bed appears to be significantly improved. Integumentary (Hair, Skin) Wound #24 status is Open. Original cause of wound was Gradually Appeared. The date acquired was: 08/22/2022. The wound has been in treatment 1 weeks. The wound is located on the Left Calcaneus. The wound measures 1.5cm length x 1.5cm width x 0.2cm depth; 1.767cm^2 area and 0.353cm^3 volume. There is Fat Layer (Subcutaneous Tissue) exposed. There is a small amount of serous drainage noted. The wound margin is distinct with the outline attached to the wound base. There is small (1-33%) red granulation within the wound bed. There is a medium (34-66%) amount of necrotic tissue within the wound bed including Adherent Slough and Necrosis of Bone. Assessment Active Problems ICD-10 Type 2 diabetes mellitus with foot ulcer Chronic venous hypertension (idiopathic) with inflammation of bilateral lower extremity Pressure ulcer of left heel, stage 3 Pressure-induced deep tissue damage of right heel Long term (current) use of anticoagulants Paroxysmal atrial fibrillation Dawn [name], Dawn [name] ([id number]) [id number] Page 9 of 10 Procedures Wound #24 Pre-procedure diagnosis of Wound #24 is a Pressure Ulcer located on the Left Calcaneus .  There was a Excisional Skin/Subcutaneous Tissue Debridement with a total area of 1.77 sq cm performed by Allen Derry, PA-C. With the following instrument(s): Curette to remove Non-Viable tissue/material. Material removed includes Subcutaneous Tissue and Slough and after achieving pain control using Lidocaine 2% T opical Gel. A time out was conducted at 15:15, prior to the start of the procedure. A Minimum amount of bleeding was controlled with Pressure. The procedure was tolerated well with a pain level of 0 throughout and a pain level of 0 following the procedure. Post Debridement Measurements: 1.5cm length x 1.5cm width x 0.2cm depth; 0.353cm^3 volume. Post debridement Stage noted as Category/Stage III. Character of Wound/Ulcer Post Debridement is stable. Post procedure Diagnosis Wound #24: Same as Pre-Procedure Plan Follow-up Appointments: Return Appointment in 1 week. [facility] Health: [name] for [facility] care. May utilize formulary equivalent dressing for wound treatment orders unless otherwise specified. Home Health Nurse may visit PRN to address patients wound care needs. Rolene Arbour for wound care to left calcan [phone] Anesthetic (Use 'Patient Medications' Section for Anesthetic Order Entry): Lidocaine applied to wound bed Edema Control - Lymphedema / Segmental Compressive Device / Other: Elevate, Exercise Daily and Avoid Standing for Long Periods of Time. Elevate legs to the level of the heart and pump ankles as often as possible Elevate leg(s) parallel to the floor when sitting. WOUND #24: - Calcaneus Wound Laterality: Left Cleanser: Normal Saline 3 x Per Week/30 Days Discharge Instructions: Wash your hands with soap and water. Remove old dressing, discard into plastic bag and place into trash. Cleanse the wound with Normal Saline prior to applying a clean dressing using gauze sponges, not tissues or cotton balls. Do not scrub or use excessive force. Pat dry using gauze  sponges, not tissue or cotton balls. Cleanser: Soap and Water 3 x Per  Week/30 Days Discharge Instructions: Gently cleanse wound with antibacterial soap, rinse and pat dry prior to dressing wounds Prim Dressing: Promogran Matrix 4.34 (in) 3 x Per Week/30 Days ary Discharge Instructions: Moisten w/normal saline or sterile water; Cover wound as directed. Do not remove from wound bed. Secondary Dressing: (BORDER) Zetuvit Plus SILICONE BORDER Dressing 4x4 (in/in) 3 x Per Week/30 Days Discharge Instructions: Please do not put silicone bordered dressings under wraps. Use non-bordered dressing only. 1. I would recommend that we have the patient continue to monitor for any evidence of infection or worsening. Based on what I am seeing I do believe that we are making good headway towards complete closure. 2. I am also can recommend the patient should continue with the Promogran and the bordered foam dressing to cover. We will see patient back for reevaluation in 1 week here in the clinic. If anything worsens or changes patient will contact our office for additional recommendations. Electronic Signature(s) Signed: 10/08/2022 3:32:09 PM By: Allen Derry PA-C Entered By: Allen Derry on 10/08/2022 15:32:09 -------------------------------------------------------------------------------- SuperBill Details Patient Name: Date of Service: Dawn [name], [place] 10/08/2022 Medical Record Number: [id number] Patient Account Number: [id number] Date of Birth/Sex: Treating RN: 08/21/1950 (72 y.o. [name], [name] Primary Care Provider: [name] Other Clinician: Referring Provider: Treating Provider/Extender: Amedeo Kinsman in Treatment: 1 Dawn [name], Dawn [name] ([id number]) [id number] Page 10 of 10 Diagnosis Coding ICD-10 Codes Code Description E11.621 Type 2 diabetes mellitus with foot ulcer I87.323 Chronic venous hypertension (idiopathic) with inflammation of bilateral  lower extremity L89.623 Pressure ulcer of left heel, stage 3 L89.616 Pressure-induced deep tissue damage of right heel Z79.01 Long term (current) use of anticoagulants I48.0 Paroxysmal atrial fibrillation Facility Procedures : CPT4 Code: [id number] Description: 11042 - DEB SUBQ TISSUE 20 SQ CM/< ICD-10 Diagnosis Description L89.623 Pressure ulcer of left heel, stage 3 Modifier: Quantity: 1 Physician Procedures : CPT4 Code Description Modifier [id number] 11042 - WC PHYS SUBQ TISS 20 SQ CM ICD-10 Diagnosis Description L89.623 Pressure ulcer of left heel, stage 3 Quantity: 1 Electronic Signature(s) Signed: 10/08/2022 3:32:17 PM By: Allen Derry PA-C Entered By: Allen Derry on 10/08/2022 15:32:17

## 2022-10-08 NOTE — Progress Notes (Signed)
CHANTIA, AMALFITANO ([id number]) [id number] Page 1 of 7 Visit Report for 10/08/2022 Arrival Information Details Patient Name: Date of Service: [name] 10/08/2022 2:45 PM Medical Record Number: [id number] Patient Account Number: [id number] Date of Birth/Sex: Treating RN: 07-28-1950 (72 y.o. [name], [name] Primary Care Zamara Cozad: Jamey Reas Other Clinician: Referring Zan Triska: Treating Gretchen Weinfeld/Extender: Loann Quill Weeks in Treatment: 1 Visit Information History Since Last Visit Added or deleted any medications: No Patient Arrived: Wheel Chair Any new allergies or adverse reactions: No Arrival Time: 14:59 Had a fall or experienced change in No Accompanied By: spouse activities of daily living that may affect Transfer Assistance: Manual risk of falls: Patient Identification Verified: Yes Pain Present Now: No Secondary Verification Process Completed: Yes Patient Requires Transmission-Based Precautions: No Patient Has Alerts: Yes Patient Alerts: Patient on Blood Thinner ABI L 1.1 02/05/22 ABI R 1.12 02/05/22 Electronic Signature(s) Unsigned Entered By: Bonnell Public on 10/08/2022 15:01:40 -------------------------------------------------------------------------------- Encounter Discharge Information Details Patient Name: Date of Service: [name] 10/08/2022 2:45 PM Medical Record Number: [id number] Patient Account Number: [id number] Date of Birth/Sex: Treating RN: [birth year] (72 y.o. [name], [name] Primary Care Cara Aguino: Jamey Reas Other Clinician: Referring Jarian Longoria: Treating Colie Josten/Extender: Clearance Coots, KA CY Weeks in Treatment: 1 Encounter Discharge Information Items Post Procedure Vitals Discharge Condition: Stable Temperature (F): 97.7 Ambulatory Status: Wheelchair Pulse (bpm): 80 Discharge Destination: Home Respiratory Rate (breaths/min): 20 Transportation: Private Auto Blood Pressure  (mmHg): 118/75 Accompanied By: s/o Schedule Follow-up Appointment: No Clinical Summary of CareSHEALEE, YORDY ([id number]) [id number] Page 2 of 7 Electronic Signature(s) Unsigned Entered By: Bonnell Public on 10/08/2022 15:26:14 -------------------------------------------------------------------------------- Lower Extremity Assessment Details Patient Name: Date of Service: [name] 10/08/2022 2:45 PM Medical Record Number: [id number] Patient Account Number: [id number] Date of Birth/Sex: Treating RN: [birth year] (72 y.o. [name], [name] Primary Care Akiko Schexnider: Jamey Reas Other Clinician: Referring Kaylah Chiasson: Treating Amorah Sebring/Extender: Clearance Coots, KA CY Weeks in Treatment: 1 Edema Assessment Assessed: [Left: No] [Right: No] Edema: [Left: N] [Right: o] Vascular Assessment Pulses: Dorsalis Pedis Palpable: [Left:Yes] Electronic Signature(s) Unsigned Entered ByBonnell Public on 10/08/2022 15:11:04 -------------------------------------------------------------------------------- Multi Wound Chart Details Patient Name: Date of Service: [name] 10/08/2022 2:45 PM Medical Record Number: [id number] Patient Account Number: [id number] Date of Birth/Sex: Treating RN: [birth year] (72 y.o. [name], [name] Primary Care Tyshia Fenter: Jamey Reas Other Clinician: Referring Karsten Howry: Treating Jamarie Joplin/Extender: Clearance Coots, KA CY Weeks in Treatment: 1 Vital Signs Height(in): 64 Pulse(bpm): 80 Weight(lbs): 250 Blood Pressure(mmHg): 118/75 Body Mass Index(BMI): 42.9 Neyens, Roshana B ([id number]) [id number] Page 3 of 7 Temperature(F): 97.7 Respiratory Rate(breaths/min): 18 [24:Photos:] [N/A:N/A] Left Calcaneus N/A N/A Wound Location: Gradually Appeared N/A N/A Wounding Event: Pressure Ulcer N/A N/A Primary Etiology: Asthma, Pneumothorax, Arrhythmia, N/A N/A Comorbid History: Hypertension, Type  II Diabetes, Gout, Osteoarthritis, Neuropathy 08/22/2022 N/A N/A Date Acquired: 1 N/A N/A Weeks of Treatment: Open N/A N/A Wound Status: No N/A N/A Wound Recurrence: 1.5x1.5x0.2 N/A N/A Measurements L x W x D (cm) 1.767 N/A N/A A (cm) : rea 0.353 N/A N/A Volume (cm) : -50.00% N/A N/A % Reduction in A rea: -199.20% N/A N/A % Reduction in Volume: Category/Stage III N/A N/A Classification: Small N/A N/A Exudate A mount: Serous N/A N/A Exudate Type: amber N/A N/A Exudate Color: Distinct, outline attached N/A N/A Wound Margin: Small (1-33%) N/A N/A Granulation A mount: Red N/A N/A Granulation Quality: Medium (34-66%) N/A N/A Necrotic  A mount: Fat Layer (Subcutaneous Tissue): Yes N/A N/A Exposed Structures: Fascia: No Tendon: No Muscle: No Joint: No Bone: No None N/A N/A Epithelialization: Treatment Notes Electronic Signature(s) Unsigned Entered ByBonnell Public on 10/08/2022 15:11:10 -------------------------------------------------------------------------------- Multi-Disciplinary Care Plan Details Patient Name: Date of Service: [name] 10/08/2022 2:45 PM Medical Record Number: [id number] Patient Account Number: [id number] Date of Birth/Sex: Treating RN: 09/03/1950 (72 y.o. [name], [name] Primary Care Aloys Hupfer: Jamey Reas Other Clinician: Referring Shama Monfils: Treating Virjean Boman/Extender: Clearance Coots, KA CY Weeks in Treatment: [address] EDANA, AGUADO B ([id number]) [id number] Page 4 of 7 Active Inactive Necrotic Tissue Nursing Diagnoses: Knowledge deficit related to management of necrotic/devitalized tissue Goals: Necrotic/devitalized tissue will be minimized in the wound bed Date Initiated: 10/01/2022 Target Resolution Date: 11/01/2022 Goal Status: Active Interventions: Assess patient pain level pre-, during and post procedure and prior to discharge Treatment Activities: Apply topical anesthetic as ordered :  10/01/2022 Notes: Wound/Skin Impairment Nursing Diagnoses: Knowledge deficit related to ulceration/compromised skin integrity Goals: Patient/caregiver will verbalize understanding of skin care regimen Date Initiated: 10/01/2022 Target Resolution Date: 11/01/2022 Goal Status: Active Ulcer/skin breakdown will have a volume reduction of 30% by week 4 Date Initiated: 10/01/2022 Target Resolution Date: 11/01/2022 Goal Status: Active Ulcer/skin breakdown will have a volume reduction of 50% by week 8 Date Initiated: 10/01/2022 Target Resolution Date: 12/02/2022 Goal Status: Active Ulcer/skin breakdown will have a volume reduction of 80% by week 12 Date Initiated: 10/01/2022 Target Resolution Date: 01/01/2023 Goal Status: Active Ulcer/skin breakdown will heal within 14 weeks Date Initiated: 10/01/2022 Target Resolution Date: 02/01/2023 Goal Status: Active Interventions: Assess patient/caregiver ability to obtain necessary supplies Assess patient/caregiver ability to perform ulcer/skin care regimen upon admission and as needed Assess ulceration(s) every visit Notes: Electronic Signature(s) Unsigned Entered ByBonnell Public on 10/08/2022 15:25:01 -------------------------------------------------------------------------------- Pain Assessment Details Patient Name: Date of Service: [name] 10/08/2022 2:45 PM Medical Record Number: [id number] Patient Account Number: [id number] Date of Birth/Sex: Treating RN: 05/27/1950 (72 y.o. [name], [name] Primary Care Treysen Sudbeck: Jamey Reas Other Clinician: Referring Milt Coye: Treating Lodie Waheed/Extender: Philipp Deputy CY Edgar, [place] B ([id number]) [id number] Page 5 of 7 Weeks in Treatment: 1 Active Problems Location of Pain Severity and Description of Pain Patient Has Paino No Site Locations Pain Management and Medication Current Pain Management: Electronic Signature(s) Unsigned Entered ByBonnell Public on 10/08/2022 15:03:13 -------------------------------------------------------------------------------- Patient/Caregiver Education Details Patient Name: Date of Service: [name] 7/18/2024andnbsp2:45 PM Medical Record Number: [id number] Patient Account Number: [id number] Date of Birth/Gender: Treating RN: 01-27-1951 (72 y.o. [name], [name] Primary Care Physician: Jamey Reas Other Clinician: Referring Physician: Treating Physician/Extender: Loann Quill Weeks in Treatment: 1 Education Assessment Education Provided To: Patient Education Topics Provided Wound Debridement: Handouts: Wound Debridement Methods: Explain/Verbal Responses: State content correctly Electronic Signature(s) ANANIAH, MACIOLEK B ([id number]) [id number] Page 6 of 7 Entered By: Bonnell Public on 10/08/2022 15:25:23 -------------------------------------------------------------------------------- Wound Assessment Details Patient Name: Date of Service: [name] 10/08/2022 2:45 PM Medical Record Number: [id number] Patient Account Number: [id number] Date of Birth/Sex: Treating RN: 24-Jan-1951 (72 y.o. [name], [name] Primary Care Desirre Eickhoff: Jamey Reas Other Clinician: Referring Hulen Mandler: Treating Shruthi Northrup/Extender: Clearance Coots, KA CY Weeks in Treatment: 1 Wound Status Wound Number: 24 Primary Pressure Ulcer Etiology: Wound Location: Left Calcaneus Wound Open Wounding Event: Gradually Appeared Status: Date Acquired: 08/22/2022 Comorbid Asthma, Pneumothorax, Arrhythmia, Hypertension, Type II Weeks Of Treatment: 1 History: Diabetes,  Gout, Osteoarthritis, Neuropathy Clustered Wound: No Photos Wound Measurements Length: (cm) 1.5 Width: (cm) 1.5 Depth: (cm) 0.2 Area: (cm) 1.767 Volume: (cm) 0.353 % Reduction in Area: -50% % Reduction in Volume: -199.2% Epithelialization: None Wound Description Classification:  Category/Stage III Wound Margin: Distinct, outline attached Exudate Amount: Small Exudate Type: Serous Exudate Color: amber Foul Odor After Cleansing: No Slough/Fibrino Yes Wound Bed Granulation Amount: Small (1-33%) Exposed Structure Granulation Quality: Red Fascia Exposed: No Necrotic Amount: Medium (34-66%) Fat Layer (Subcutaneous Tissue) Exposed: Yes Necrotic Quality: Adherent Slough, Bone Tendon Exposed: No Muscle Exposed: No Joint Exposed: No Bone Exposed: No Treatment Notes KASHARI, CHALMERS B ([id number]) [id number] Page 7 of 7 Wound #24 (Calcaneus) Wound Laterality: Left Cleanser Normal Saline Discharge Instruction: Wash your hands with soap and water. Remove old dressing, discard into plastic bag and place into trash. Cleanse the wound with Normal Saline prior to applying a clean dressing using gauze sponges, not tissues or cotton balls. Do not scrub or use excessive force. Pat dry using gauze sponges, not tissue or cotton balls. Soap and Water Discharge Instruction: Gently cleanse wound with antibacterial soap, rinse and pat dry prior to dressing wounds Peri-Wound Care Topical Primary Dressing Promogran Matrix 4.34 (in) Discharge Instruction: Moisten w/normal saline or sterile water; Cover wound as directed. Do not remove from wound bed. Secondary Dressing (BORDER) Zetuvit Plus SILICONE BORDER Dressing 4x4 (in/in) Discharge Instruction: Please do not put silicone bordered dressings under wraps. Use non-bordered dressing only. Secured With Compression Wrap Compression Stockings Add-Ons Electronic Signature(s) Unsigned Entered ByBonnell Public on 10/08/2022 15:10:50 -------------------------------------------------------------------------------- Vitals Details Patient Name: Date of Service: [name] 10/08/2022 2:45 PM Medical Record Number: [id number] Patient Account Number: [id number] Date of Birth/Sex: Treating RN: [birth year]  (72 y.o. F) Coulter, [name] Primary Care Lashaun Poch: Jamey Reas Other Clinician: Referring Zaira Iacovelli: Treating Jaylanni Eltringham/Extender: Clearance Coots, KA CY Weeks in Treatment: 1 Vital Signs Time Taken: 15:00 Temperature (F): 97.7 Height (in): 64 Pulse (bpm): 80 Weight (lbs): 250 Respiratory Rate (breaths/min): 18 Body Mass Index (BMI): 42.9 Blood Pressure (mmHg): 118/75 Reference Range: 80 - 120 mg / dl Electronic Signature(s) Unsigned Entered ByBonnell Public on 10/08/2022 15:02:55 Signature(s): Date(s):

## 2022-10-08 NOTE — Assessment & Plan Note (Signed)
Chronic. Stable on Mounjaro 10 mg weekly. Continue. She would like to increase after completing 4 weeks of 10 mg, will send Rx for 12.5 mg.

## 2022-10-08 NOTE — Assessment & Plan Note (Signed)
Surgery 09/01/2022. Doing well. Home from rehab facility on 09/24/2022. Home health nursing and OT to begin this afternoon. She will continue therapies as ordered. Follow up with surgeon next week as scheduled.

## 2022-10-08 NOTE — Assessment & Plan Note (Addendum)
Patient unable to void today in office. She will return tomorrow with a urine sample. Microscopic and culture pending. Will contact patient with results. Advised adequate fluid intake.

## 2022-10-08 NOTE — Assessment & Plan Note (Signed)
Continue follow up weekly with wound care.

## 2022-10-09 ENCOUNTER — Other Ambulatory Visit: Payer: Self-pay | Admitting: Pulmonary Disease

## 2022-10-09 ENCOUNTER — Other Ambulatory Visit: Payer: Self-pay | Admitting: Nurse Practitioner

## 2022-10-09 DIAGNOSIS — K5909 Other constipation: Secondary | ICD-10-CM | POA: Diagnosis not present

## 2022-10-09 DIAGNOSIS — M109 Gout, unspecified: Secondary | ICD-10-CM | POA: Diagnosis not present

## 2022-10-09 DIAGNOSIS — Z7984 Long term (current) use of oral hypoglycemic drugs: Secondary | ICD-10-CM | POA: Diagnosis not present

## 2022-10-09 DIAGNOSIS — Z7901 Long term (current) use of anticoagulants: Secondary | ICD-10-CM | POA: Diagnosis not present

## 2022-10-09 DIAGNOSIS — Z792 Long term (current) use of antibiotics: Secondary | ICD-10-CM | POA: Diagnosis not present

## 2022-10-09 DIAGNOSIS — J4489 Other specified chronic obstructive pulmonary disease: Secondary | ICD-10-CM | POA: Diagnosis not present

## 2022-10-09 DIAGNOSIS — N390 Urinary tract infection, site not specified: Secondary | ICD-10-CM | POA: Diagnosis not present

## 2022-10-09 DIAGNOSIS — E785 Hyperlipidemia, unspecified: Secondary | ICD-10-CM | POA: Diagnosis not present

## 2022-10-09 DIAGNOSIS — Z7951 Long term (current) use of inhaled steroids: Secondary | ICD-10-CM | POA: Diagnosis not present

## 2022-10-09 DIAGNOSIS — E1142 Type 2 diabetes mellitus with diabetic polyneuropathy: Secondary | ICD-10-CM

## 2022-10-09 DIAGNOSIS — F32A Depression, unspecified: Secondary | ICD-10-CM | POA: Diagnosis not present

## 2022-10-09 DIAGNOSIS — L89620 Pressure ulcer of left heel, unstageable: Secondary | ICD-10-CM | POA: Diagnosis not present

## 2022-10-09 DIAGNOSIS — Z96612 Presence of left artificial shoulder joint: Secondary | ICD-10-CM | POA: Diagnosis not present

## 2022-10-09 DIAGNOSIS — L89610 Pressure ulcer of right heel, unstageable: Secondary | ICD-10-CM | POA: Diagnosis not present

## 2022-10-09 DIAGNOSIS — G4733 Obstructive sleep apnea (adult) (pediatric): Secondary | ICD-10-CM | POA: Diagnosis not present

## 2022-10-09 DIAGNOSIS — I4891 Unspecified atrial fibrillation: Secondary | ICD-10-CM | POA: Diagnosis not present

## 2022-10-09 DIAGNOSIS — D509 Iron deficiency anemia, unspecified: Secondary | ICD-10-CM | POA: Diagnosis not present

## 2022-10-09 DIAGNOSIS — R41841 Cognitive communication deficit: Secondary | ICD-10-CM | POA: Diagnosis not present

## 2022-10-09 DIAGNOSIS — Z471 Aftercare following joint replacement surgery: Secondary | ICD-10-CM | POA: Diagnosis not present

## 2022-10-09 DIAGNOSIS — E538 Deficiency of other specified B group vitamins: Secondary | ICD-10-CM | POA: Diagnosis not present

## 2022-10-09 DIAGNOSIS — M797 Fibromyalgia: Secondary | ICD-10-CM

## 2022-10-09 DIAGNOSIS — F419 Anxiety disorder, unspecified: Secondary | ICD-10-CM | POA: Diagnosis not present

## 2022-10-09 DIAGNOSIS — E1151 Type 2 diabetes mellitus with diabetic peripheral angiopathy without gangrene: Secondary | ICD-10-CM | POA: Diagnosis not present

## 2022-10-09 DIAGNOSIS — K219 Gastro-esophageal reflux disease without esophagitis: Secondary | ICD-10-CM | POA: Diagnosis not present

## 2022-10-09 DIAGNOSIS — E039 Hypothyroidism, unspecified: Secondary | ICD-10-CM | POA: Diagnosis not present

## 2022-10-09 DIAGNOSIS — I10 Essential (primary) hypertension: Secondary | ICD-10-CM | POA: Diagnosis not present

## 2022-10-09 NOTE — Telephone Encounter (Signed)
Refilled: 06/25/2022 Last OV: 10/07/2022 Next OV: 01/07/2023

## 2022-10-11 DIAGNOSIS — F32A Depression, unspecified: Secondary | ICD-10-CM | POA: Diagnosis not present

## 2022-10-11 DIAGNOSIS — Z8673 Personal history of transient ischemic attack (TIA), and cerebral infarction without residual deficits: Secondary | ICD-10-CM | POA: Diagnosis not present

## 2022-10-11 DIAGNOSIS — R0681 Apnea, not elsewhere classified: Secondary | ICD-10-CM | POA: Diagnosis not present

## 2022-10-11 DIAGNOSIS — G4733 Obstructive sleep apnea (adult) (pediatric): Secondary | ICD-10-CM | POA: Diagnosis not present

## 2022-10-11 DIAGNOSIS — I10 Essential (primary) hypertension: Secondary | ICD-10-CM | POA: Diagnosis not present

## 2022-10-11 DIAGNOSIS — F419 Anxiety disorder, unspecified: Secondary | ICD-10-CM | POA: Diagnosis not present

## 2022-10-11 DIAGNOSIS — G4700 Insomnia, unspecified: Secondary | ICD-10-CM | POA: Diagnosis not present

## 2022-10-13 ENCOUNTER — Telehealth: Payer: Self-pay | Admitting: Nurse Practitioner

## 2022-10-13 DIAGNOSIS — Z792 Long term (current) use of antibiotics: Secondary | ICD-10-CM | POA: Diagnosis not present

## 2022-10-13 DIAGNOSIS — E538 Deficiency of other specified B group vitamins: Secondary | ICD-10-CM | POA: Diagnosis not present

## 2022-10-13 DIAGNOSIS — G4733 Obstructive sleep apnea (adult) (pediatric): Secondary | ICD-10-CM | POA: Diagnosis not present

## 2022-10-13 DIAGNOSIS — D509 Iron deficiency anemia, unspecified: Secondary | ICD-10-CM | POA: Diagnosis not present

## 2022-10-13 DIAGNOSIS — Z7984 Long term (current) use of oral hypoglycemic drugs: Secondary | ICD-10-CM | POA: Diagnosis not present

## 2022-10-13 DIAGNOSIS — K219 Gastro-esophageal reflux disease without esophagitis: Secondary | ICD-10-CM | POA: Diagnosis not present

## 2022-10-13 DIAGNOSIS — J4489 Other specified chronic obstructive pulmonary disease: Secondary | ICD-10-CM | POA: Diagnosis not present

## 2022-10-13 DIAGNOSIS — I10 Essential (primary) hypertension: Secondary | ICD-10-CM | POA: Diagnosis not present

## 2022-10-13 DIAGNOSIS — Z471 Aftercare following joint replacement surgery: Secondary | ICD-10-CM | POA: Diagnosis not present

## 2022-10-13 DIAGNOSIS — N390 Urinary tract infection, site not specified: Secondary | ICD-10-CM | POA: Diagnosis not present

## 2022-10-13 DIAGNOSIS — E1151 Type 2 diabetes mellitus with diabetic peripheral angiopathy without gangrene: Secondary | ICD-10-CM | POA: Diagnosis not present

## 2022-10-13 DIAGNOSIS — I4891 Unspecified atrial fibrillation: Secondary | ICD-10-CM | POA: Diagnosis not present

## 2022-10-13 DIAGNOSIS — L89610 Pressure ulcer of right heel, unstageable: Secondary | ICD-10-CM | POA: Diagnosis not present

## 2022-10-13 DIAGNOSIS — Z7901 Long term (current) use of anticoagulants: Secondary | ICD-10-CM | POA: Diagnosis not present

## 2022-10-13 DIAGNOSIS — F419 Anxiety disorder, unspecified: Secondary | ICD-10-CM | POA: Diagnosis not present

## 2022-10-13 DIAGNOSIS — K5909 Other constipation: Secondary | ICD-10-CM | POA: Diagnosis not present

## 2022-10-13 DIAGNOSIS — E785 Hyperlipidemia, unspecified: Secondary | ICD-10-CM | POA: Diagnosis not present

## 2022-10-13 DIAGNOSIS — F32A Depression, unspecified: Secondary | ICD-10-CM | POA: Diagnosis not present

## 2022-10-13 DIAGNOSIS — L89620 Pressure ulcer of left heel, unstageable: Secondary | ICD-10-CM | POA: Diagnosis not present

## 2022-10-13 DIAGNOSIS — Z96612 Presence of left artificial shoulder joint: Secondary | ICD-10-CM | POA: Diagnosis not present

## 2022-10-13 DIAGNOSIS — E039 Hypothyroidism, unspecified: Secondary | ICD-10-CM | POA: Diagnosis not present

## 2022-10-13 DIAGNOSIS — M797 Fibromyalgia: Secondary | ICD-10-CM | POA: Diagnosis not present

## 2022-10-13 DIAGNOSIS — Z7951 Long term (current) use of inhaled steroids: Secondary | ICD-10-CM | POA: Diagnosis not present

## 2022-10-13 DIAGNOSIS — M109 Gout, unspecified: Secondary | ICD-10-CM | POA: Diagnosis not present

## 2022-10-13 DIAGNOSIS — R41841 Cognitive communication deficit: Secondary | ICD-10-CM | POA: Diagnosis not present

## 2022-10-14 ENCOUNTER — Other Ambulatory Visit: Payer: Self-pay | Admitting: *Deleted

## 2022-10-14 ENCOUNTER — Telehealth: Payer: Self-pay | Admitting: Nurse Practitioner

## 2022-10-14 DIAGNOSIS — I10 Essential (primary) hypertension: Secondary | ICD-10-CM | POA: Diagnosis not present

## 2022-10-14 DIAGNOSIS — Z7901 Long term (current) use of anticoagulants: Secondary | ICD-10-CM | POA: Diagnosis not present

## 2022-10-14 DIAGNOSIS — R41841 Cognitive communication deficit: Secondary | ICD-10-CM | POA: Diagnosis not present

## 2022-10-14 DIAGNOSIS — Z7951 Long term (current) use of inhaled steroids: Secondary | ICD-10-CM | POA: Diagnosis not present

## 2022-10-14 DIAGNOSIS — L89610 Pressure ulcer of right heel, unstageable: Secondary | ICD-10-CM | POA: Diagnosis not present

## 2022-10-14 DIAGNOSIS — K219 Gastro-esophageal reflux disease without esophagitis: Secondary | ICD-10-CM | POA: Diagnosis not present

## 2022-10-14 DIAGNOSIS — G4733 Obstructive sleep apnea (adult) (pediatric): Secondary | ICD-10-CM | POA: Diagnosis not present

## 2022-10-14 DIAGNOSIS — M797 Fibromyalgia: Secondary | ICD-10-CM | POA: Diagnosis not present

## 2022-10-14 DIAGNOSIS — M109 Gout, unspecified: Secondary | ICD-10-CM | POA: Diagnosis not present

## 2022-10-14 DIAGNOSIS — D509 Iron deficiency anemia, unspecified: Secondary | ICD-10-CM | POA: Diagnosis not present

## 2022-10-14 DIAGNOSIS — Z792 Long term (current) use of antibiotics: Secondary | ICD-10-CM | POA: Diagnosis not present

## 2022-10-14 DIAGNOSIS — E1151 Type 2 diabetes mellitus with diabetic peripheral angiopathy without gangrene: Secondary | ICD-10-CM | POA: Diagnosis not present

## 2022-10-14 DIAGNOSIS — I4891 Unspecified atrial fibrillation: Secondary | ICD-10-CM | POA: Diagnosis not present

## 2022-10-14 DIAGNOSIS — F419 Anxiety disorder, unspecified: Secondary | ICD-10-CM | POA: Diagnosis not present

## 2022-10-14 DIAGNOSIS — L89620 Pressure ulcer of left heel, unstageable: Secondary | ICD-10-CM | POA: Diagnosis not present

## 2022-10-14 DIAGNOSIS — E785 Hyperlipidemia, unspecified: Secondary | ICD-10-CM | POA: Diagnosis not present

## 2022-10-14 DIAGNOSIS — N390 Urinary tract infection, site not specified: Secondary | ICD-10-CM | POA: Diagnosis not present

## 2022-10-14 DIAGNOSIS — Z471 Aftercare following joint replacement surgery: Secondary | ICD-10-CM | POA: Diagnosis not present

## 2022-10-14 DIAGNOSIS — Z96612 Presence of left artificial shoulder joint: Secondary | ICD-10-CM | POA: Diagnosis not present

## 2022-10-14 DIAGNOSIS — E039 Hypothyroidism, unspecified: Secondary | ICD-10-CM | POA: Diagnosis not present

## 2022-10-14 DIAGNOSIS — K5909 Other constipation: Secondary | ICD-10-CM | POA: Diagnosis not present

## 2022-10-14 DIAGNOSIS — J4489 Other specified chronic obstructive pulmonary disease: Secondary | ICD-10-CM | POA: Diagnosis not present

## 2022-10-14 DIAGNOSIS — R300 Dysuria: Secondary | ICD-10-CM | POA: Diagnosis not present

## 2022-10-14 DIAGNOSIS — Z7984 Long term (current) use of oral hypoglycemic drugs: Secondary | ICD-10-CM | POA: Diagnosis not present

## 2022-10-14 DIAGNOSIS — F32A Depression, unspecified: Secondary | ICD-10-CM | POA: Diagnosis not present

## 2022-10-14 DIAGNOSIS — E538 Deficiency of other specified B group vitamins: Secondary | ICD-10-CM | POA: Diagnosis not present

## 2022-10-14 MED ORDER — PANTOPRAZOLE SODIUM 40 MG PO TBEC
40.0000 mg | DELAYED_RELEASE_TABLET | Freq: Every day | ORAL | 3 refills | Status: DC
Start: 2022-10-14 — End: 2024-01-20

## 2022-10-14 NOTE — Telephone Encounter (Signed)
Pt spouse called in to let us know that he dropped of urine specimen from pt on white container.

## 2022-10-15 ENCOUNTER — Encounter: Payer: Medicare HMO | Admitting: Internal Medicine

## 2022-10-15 DIAGNOSIS — I480 Paroxysmal atrial fibrillation: Secondary | ICD-10-CM | POA: Diagnosis not present

## 2022-10-15 DIAGNOSIS — L89616 Pressure-induced deep tissue damage of right heel: Secondary | ICD-10-CM | POA: Diagnosis not present

## 2022-10-15 DIAGNOSIS — Z7901 Long term (current) use of anticoagulants: Secondary | ICD-10-CM | POA: Diagnosis not present

## 2022-10-15 DIAGNOSIS — I87323 Chronic venous hypertension (idiopathic) with inflammation of bilateral lower extremity: Secondary | ICD-10-CM | POA: Diagnosis not present

## 2022-10-15 DIAGNOSIS — L89623 Pressure ulcer of left heel, stage 3: Secondary | ICD-10-CM | POA: Diagnosis not present

## 2022-10-15 LAB — URINALYSIS, ROUTINE W REFLEX MICROSCOPIC
Bilirubin Urine: NEGATIVE
Ketones, ur: NEGATIVE
Leukocytes,Ua: NEGATIVE
Specific Gravity, Urine: 1.015 (ref 1.000–1.030)
Total Protein, Urine: NEGATIVE
Urine Glucose: >=1000 — AB
Urobilinogen, UA: 0.2 (ref 0.0–1.0)
pH: 5.5 (ref 5.0–8.0)

## 2022-10-15 LAB — URINE CULTURE: MICRO NUMBER:: 15240989

## 2022-10-15 NOTE — Progress Notes (Signed)
JERRIANNE, HARTIN ([id number]) [id number] Page 1 of 9 Visit Report for 10/15/2022 Arrival Information Details Patient Name: Date of Service: [name] 10/15/2022 2:45 PM Medical Record Number: [id number] Patient Account Number: [id number] Date of Birth/Sex: Treating RN: [birth year] (72 y.o. [name] Primary Care Nial Hawe: [name] Other Clinician: Referring Murriel Eidem: Treating Ronny Ruddell/Extender: Dawn BSO Dorris Carnes, MICHA EL Ilean Skill Weeks in Treatment: 2 Visit Information History Since Last Visit Added or deleted any medications: No Patient Arrived: Wheel Chair Any new allergies or adverse reactions: No Arrival Time: 15:05 Has Dressing in Place as Prescribed: Yes Accompanied By: husband Pain Present Now: No Transfer Assistance: EasyPivot Patient Lift Patient Identification Verified: Yes Secondary Verification Process Completed: Yes Patient Requires Transmission-Based Precautions: No Patient Has Alerts: Yes Patient Alerts: Patient on Blood Thinner ABI L 1.1 02/05/22 ABI R 1.12 02/05/22 Electronic Signature(s) Signed: 10/15/2022 4:38:22 PM By: Midge Aver MSN RN CNS WTA Entered By: Midge Aver on 10/15/2022 16:38:21 -------------------------------------------------------------------------------- Clinic Level of Care Assessment Details Patient Name: Date of Service: [name] 10/15/2022 2:45 PM Medical Record Number: [id number] Patient Account Number: [id number] Date of Birth/Sex: Treating RN: May 10, 1950 (72 y.o. [name] Primary Care Taliyah Watrous: [name] Other Clinician: Referring Alivea Gladson: Treating Meloney Feld/Extender: Dawn BSO N, MICHA EL Ilean Skill Weeks in Treatment: 2 Clinic Level of Care Assessment Items TOOL 4 Quantity Score X- 1 0 Use when only an EandM is performed on FOLLOW-UP visit ASSESSMENTS - Nursing Assessment / Reassessment X- 1 10 Reassessment of Co-morbidities (includes updates in patient  status) X- 1 5 Reassessment of Adherence to Treatment Plan ASSESSMENTS - Wound and Skin A ssessment / Reassessment X - Simple Wound Assessment / Reassessment - one wound 1 5 Cheatum, Adreena B ([id number]) [id number] Page 2 of 9 []  - 0 Complex Wound Assessment / Reassessment - multiple wounds []  - 0 Dermatologic / Skin Assessment (not related to wound area) ASSESSMENTS - Focused Assessment []  - 0 Circumferential Edema Measurements - multi extremities []  - 0 Nutritional Assessment / Counseling / Intervention []  - 0 Lower Extremity Assessment (monofilament, tuning fork, pulses) []  - 0 Peripheral Arterial Disease Assessment (using hand held doppler) ASSESSMENTS - Ostomy and/or Continence Assessment and Care []  - 0 Incontinence Assessment and Management []  - 0 Ostomy Care Assessment and Management (repouching, etc.) PROCESS - Coordination of Care X - Simple Patient / Family Education for ongoing care 1 15 []  - 0 Complex (extensive) Patient / Family Education for ongoing care X- 1 10 Staff obtains Chiropractor, Records, T Results / Process Orders est []  - 0 Staff telephones HHA, Nursing Homes / Clarify orders / etc []  - 0 Routine Transfer to another Facility (non-emergent condition) []  - 0 Routine Hospital Admission (non-emergent condition) []  - 0 New Admissions / Manufacturing engineer / Ordering NPWT Apligraf, etc. , []  - 0 Emergency Hospital Admission (emergent condition) X- 1 10 Simple Discharge Coordination []  - 0 Complex (extensive) Discharge Coordination PROCESS - Special Needs []  - 0 Pediatric / Minor Patient Management []  - 0 Isolation Patient Management []  - 0 Hearing / Language / Visual special needs []  - 0 Assessment of Community assistance (transportation, D/C planning, etc.) []  - 0 Additional assistance / Altered mentation []  - 0 Support Surface(s) Assessment (bed, cushion, seat, etc.) INTERVENTIONS - Wound Cleansing /  Measurement X - Simple Wound Cleansing - one wound 1 5 []  - 0 Complex Wound Cleansing - multiple wounds X- 1 5 Wound Imaging (photographs - any  number of wounds) []  - 0 Wound Tracing (instead of photographs) X- 1 5 Simple Wound Measurement - one wound []  - 0 Complex Wound Measurement - multiple wounds INTERVENTIONS - Wound Dressings X - Small Wound Dressing one or multiple wounds 1 10 []  - 0 Medium Wound Dressing one or multiple wounds []  - 0 Large Wound Dressing one or multiple wounds []  - 0 Application of Medications - topical []  - 0 Application of Medications - injection INTERVENTIONS - Miscellaneous []  - 0 External ear exam []  - 0 Specimen Collection (cultures, biopsies, blood, body fluids, etc.) BATINA, DOUGAN B ([id number]) [phone].pdf Page 3 of 9 []  - 0 Specimen(s) / Culture(s) sent or taken to Lab for analysis []  - 0 Patient Transfer (multiple staff / Michiel Sites Lift / Similar devices) []  - 0 Simple Staple / Suture removal (25 or less) []  - 0 Complex Staple / Suture removal (26 or more) []  - 0 Hypo / Hyperglycemic Management (close monitor of Blood Glucose) []  - 0 Ankle / Brachial Index (ABI) - do not check if billed separately X- 1 5 Vital Signs Has the patient been seen at the hospital within the last three years: Yes Total Score: 85 Level Of Care: New/Established - Level 3 Electronic Signature(s) Signed: 10/15/2022 4:43:06 PM By: Midge Aver MSN RN CNS WTA Entered By: Midge Aver on 10/15/2022 16:40:53 -------------------------------------------------------------------------------- Encounter Discharge Information Details Patient Name: Date of Service: [name] Post 10/15/2022 2:45 PM Medical Record Number: [id number] Patient Account Number: [id number] Date of Birth/Sex: Treating RN: 10/19/1950 (72 y.o. [name] Primary Care Emilea Goga: [name] Other Clinician: Referring Shylie Polo: Treating Benyamin Jeff/Extender: Dawn BSO  N, MICHA EL Ilean Skill Weeks in Treatment: 2 Encounter Discharge Information Items Discharge Condition: Stable Ambulatory Status: Wheelchair Discharge Destination: Home Transportation: Private Auto Accompanied By: self Schedule Follow-up Appointment: Yes Clinical Summary of Care: Electronic Signature(s) Signed: 10/15/2022 4:41:39 PM By: Midge Aver MSN RN CNS WTA Entered By: Midge Aver on 10/15/2022 16:41:39 -------------------------------------------------------------------------------- Lower Extremity Assessment Details Patient Name: Date of Service: [name] 10/15/2022 2:45 PM Medical Record Number: [id number] Patient Account Number: [id number] Date of Birth/Sex: Treating RN: April 15, 1950 (72 y.o. Cedricka, Sackrider, Vaughn B ([id number]) [id number] Page 4 of 9 Primary Care Shakeerah Gradel: [name] Other Clinician: Referring Breyana Follansbee: Treating Zakyla Tonche/Extender: Chauncey Mann, MICHA EL Ilean Skill Weeks in Treatment: 2 Electronic Signature(s) Signed: 10/15/2022 4:40:30 PM By: Midge Aver MSN RN CNS WTA Entered By: Midge Aver on 10/15/2022 16:40:30 -------------------------------------------------------------------------------- Multi Wound Chart Details Patient Name: Date of Service: [name] Post 10/15/2022 2:45 PM Medical Record Number: [id number] Patient Account Number: [id number] Date of Birth/Sex: Treating RN: 03-18-1951 (72 y.o. [name] Primary Care Allayah Raineri: [name] Other Clinician: Referring Joyel Chenette: Treating Rashanna Christiana/Extender: Dawn BSO N, MICHA EL Reynolds Bowl, Arville Lime Weeks in Treatment: 2 Vital Signs Height(in): 64 Pulse(bpm): 53 Weight(lbs): 250 Blood Pressure(mmHg): 116/68 Body Mass Index(BMI): 42.9 Temperature(F): 97.8 Respiratory Rate(breaths/min): 18 [24:Photos:] [N/A:N/A] Left Calcaneus N/A N/A Wound Location: Gradually Appeared N/A N/A Wounding Event: Pressure Ulcer N/A N/A Primary  Etiology: Asthma, Pneumothorax, Arrhythmia, N/A N/A Comorbid History: Hypertension, Type II Diabetes, Gout, Osteoarthritis, Neuropathy 08/22/2022 N/A N/A Date Acquired: 2 N/A N/A Weeks of Treatment: Open N/A N/A Wound Status: No N/A N/A Wound Recurrence: 1x1.2x0.1 N/A N/A Measurements L x W x D (cm) 0.942 N/A N/A A (cm) : rea 0.094 N/A N/A Volume (cm) : 20.00% N/A N/A % Reduction in A rea: 20.30% N/A N/A %  Reduction in Volume: Category/Stage III N/A N/A Classification: Small N/A N/A Exudate A mount: Serous N/A N/A Exudate Type: amber N/A N/A Exudate Color: Distinct, outline attached N/A N/A Wound Margin: Small (1-33%) N/A N/A Granulation A mount: Red N/A N/A Granulation Quality: Medium (34-66%) N/A N/A Necrotic A mount: Fat Layer (Subcutaneous Tissue): Yes N/A N/A Exposed Structures: Fascia: No Tendon: No Muscle: No Joint: No ANNINA, PIOTROWSKI B ([id number]) [id number] Page 5 of 9 Bone: No None N/A N/A Epithelialization: Treatment Notes Wound #24 (Calcaneus) Wound Laterality: Left Cleanser Normal Saline Discharge Instruction: Wash your hands with soap and water. Remove old dressing, discard into plastic bag and place into trash. Cleanse the wound with Normal Saline prior to applying a clean dressing using gauze sponges, not tissues or cotton balls. Do not scrub or use excessive force. Pat dry using gauze sponges, not tissue or cotton balls. Soap and Water Discharge Instruction: Gently cleanse wound with antibacterial soap, rinse and pat dry prior to dressing wounds Peri-Wound Care Topical Primary Dressing Promogran Matrix 4.34 (in) Discharge Instruction: Moisten w/normal saline or sterile water; Cover wound as directed. Do not remove from wound bed. Secondary Dressing (BORDER) Zetuvit Plus SILICONE BORDER Dressing 4x4 (in/in) Discharge Instruction: Please do not put silicone bordered dressings under wraps. Use non-bordered  dressing only. Secured With Compression Wrap Compression Stockings Facilities manager) Signed: 10/15/2022 4:40:35 PM By: Midge Aver MSN RN CNS WTA Entered By: Midge Aver on 10/15/2022 16:40:34 -------------------------------------------------------------------------------- Multi-Disciplinary Care Plan Details Patient Name: Date of Service: Dawn Clement, [place] 10/15/2022 2:45 PM Medical Record Number: [id number] Patient Account Number: [id number] Date of Birth/Sex: Treating RN: [birth year] (72 y.o. [name] Primary Care Dorotea Hand: [name] Other Clinician: Referring Daton Szilagyi: Treating Hagar Sadiq/Extender: Dawn BSO N, MICHA EL Ilean Skill Weeks in Treatment: 2 Active Inactive Necrotic Tissue Nursing Diagnoses: Knowledge deficit related to management of necrotic/devitalized tissue Goals: Necrotic/devitalized tissue will be minimized in the wound bed Date Initiated: 10/01/2022 Target Resolution Date: 11/01/2022 Goal Status: Active JONNAE, FONSECA ([id number]) [phone].pdf Page 6 of 9 Interventions: Assess patient pain level pre-, during and post procedure and prior to discharge Treatment Activities: Apply topical anesthetic as ordered : 10/01/2022 Notes: Wound/Skin Impairment Nursing Diagnoses: Knowledge deficit related to ulceration/compromised skin integrity Goals: Patient/caregiver will verbalize understanding of skin care regimen Date Initiated: 10/01/2022 Target Resolution Date: 11/01/2022 Goal Status: Active Ulcer/skin breakdown will have a volume reduction of 30% by week 4 Date Initiated: 10/01/2022 Target Resolution Date: 11/01/2022 Goal Status: Active Ulcer/skin breakdown will have a volume reduction of 50% by week 8 Date Initiated: 10/01/2022 Target Resolution Date: 12/02/2022 Goal Status: Active Ulcer/skin breakdown will have a volume reduction of 80% by week 12 Date Initiated: 10/01/2022 Target Resolution Date:  01/01/2023 Goal Status: Active Ulcer/skin breakdown will heal within 14 weeks Date Initiated: 10/01/2022 Target Resolution Date: 02/01/2023 Goal Status: Active Interventions: Assess patient/caregiver ability to obtain necessary supplies Assess patient/caregiver ability to perform ulcer/skin care regimen upon admission and as needed Assess ulceration(s) every visit Notes: Electronic Signature(s) Signed: 10/15/2022 4:41:09 PM By: Midge Aver MSN RN CNS WTA Entered By: Midge Aver on 10/15/2022 16:41:09 -------------------------------------------------------------------------------- Pain Assessment Details Patient Name: Date of Service: [name] 10/15/2022 2:45 PM Medical Record Number: [id number] Patient Account Number: [id number] Date of Birth/Sex: Treating RN: 25-Jan-1951 (72 y.o. [name] Primary Care Lyndie Vanderloop: [name] Other Clinician: Referring Treena Cosman: Treating Denna Fryberger/Extender: Dawn BSO N, MICHA EL Ilean Skill Weeks in Treatment: 2 Active Problems Location of Pain  Severity and Description of Pain Patient Has [facility] [address] PAOLA, ALESHIRE B ([id number]) [id number] Page 7 of 9 Pain Management and Medication Current Pain Management: Electronic Signature(s) Signed: 10/15/2022 4:40:23 PM By: Midge Aver MSN RN CNS WTA Entered By: Midge Aver on 10/15/2022 16:40:23 -------------------------------------------------------------------------------- Patient/Caregiver Education Details Patient Name: Date of Service: [name] 7/25/2024andnbsp2:45 PM Medical Record Number: [id number] Patient Account Number: [id number] Date of Birth/Gender: Treating RN: Jan 11, 1951 (72 y.o. [name] Primary Care Physician: [name] Other Clinician: Referring Physician: Treating Physician/Extender: Dawn BSO Dorris Carnes, MICHA EL Genia Plants in Treatment: 2 Education Assessment Education Provided To: Patient Education  Topics Provided Wound/Skin Impairment: Handouts: Caring for Your Ulcer Methods: Explain/Verbal Responses: State content correctly Electronic Signature(s) Signed: 10/15/2022 4:43:06 PM By: Midge Aver MSN RN CNS WTA Entered By: Midge Aver on 10/15/2022 16:41:14 Trudee Grip B ([id number]) [id number] Page 8 of 9 -------------------------------------------------------------------------------- Wound Assessment Details Patient Name: Date of Service: [name] 10/15/2022 2:45 PM Medical Record Number: [id number] Patient Account Number: [id number] Date of Birth/Sex: Treating RN: [birth year] (72 y.o. [name] Primary Care Quran Vasco: [name] Other Clinician: Referring Guiliana Shor: Treating Tishawn Friedhoff/Extender: Dawn BSO N, MICHA EL Ilean Skill Weeks in Treatment: 2 Wound Status Wound Number: 24 Primary Pressure Ulcer Etiology: Wound Location: Left Calcaneus Wound Open Wounding Event: Gradually Appeared Status: Date Acquired: 08/22/2022 Comorbid Asthma, Pneumothorax, Arrhythmia, Hypertension, Type II Weeks Of Treatment: 2 History: Diabetes, Gout, Osteoarthritis, Neuropathy Clustered Wound: No Photos Wound Measurements Length: (cm) 1 Width: (cm) 1.2 Depth: (cm) 0.1 Area: (cm) 0.942 Volume: (cm) 0.094 % Reduction in Area: 20% % Reduction in Volume: 20.3% Epithelialization: None Wound Description Classification: Category/Stage III Wound Margin: Distinct, outline attached Exudate Amount: Small Exudate Type: Serous Exudate Color: amber Foul Odor After Cleansing: No Slough/Fibrino Yes Wound Bed Granulation Amount: Small (1-33%) Exposed Structure Granulation Quality: Red Fascia Exposed: No Necrotic Amount: Medium (34-66%) Fat Layer (Subcutaneous Tissue) Exposed: Yes Necrotic Quality: Adherent Slough, Bone Tendon Exposed: No Muscle Exposed: No Joint Exposed: No Bone Exposed: No Treatment Notes Wound #24 (Calcaneus) Wound Laterality:  Left Cleanser Normal Saline Discharge Instruction: Wash your hands with soap and water. Remove old dressing, discard into plastic bag and place into trash. Cleanse the wound with Normal Saline prior to applying a clean dressing using gauze sponges, not tissues or cotton balls. Do not scrub or use excessive KADIA, ABAYA B ([id number]) [id number] Page 9 of 9 force. Pat dry using gauze sponges, not tissue or cotton balls. Soap and Water Discharge Instruction: Gently cleanse wound with antibacterial soap, rinse and pat dry prior to dressing wounds Peri-Wound Care Topical Primary Dressing Promogran Matrix 4.34 (in) Discharge Instruction: Moisten w/normal saline or sterile water; Cover wound as directed. Do not remove from wound bed. Secondary Dressing (BORDER) Zetuvit Plus SILICONE BORDER Dressing 4x4 (in/in) Discharge Instruction: Please do not put silicone bordered dressings under wraps. Use non-bordered dressing only. Secured With Compression Wrap Compression Stockings Facilities manager) Signed: 10/15/2022 4:43:06 PM By: Midge Aver MSN RN CNS WTA Entered By: Midge Aver on 10/15/2022 15:20:34 -------------------------------------------------------------------------------- Vitals Details Patient Name: Date of Service: Dawn Clement, [place]. 10/15/2022 2:45 PM Medical Record Number: [id number] Patient Account Number: [id number] Date of Birth/Sex: Treating RN: 05-Jun-1950 (72 y.o. [name] Primary Care Terence Bart: [name] Other Clinician: Referring Khadijah Mastrianni: Treating Oliveah Zwack/Extender: Dawn BSO N, MICHA EL Ilean Skill Weeks in Treatment: 2 Vital Signs Time Taken: 15:10 Temperature (F): 97.8 Height (in): 64  Pulse (bpm): 53 Weight (lbs): 250 Respiratory Rate (breaths/min): 18 Body Mass Index (BMI): 42.9 Blood Pressure (mmHg): 116/68 Reference Range: 80 - 120 mg / dl Electronic Signature(s) Signed: 10/15/2022 4:40:20 PM By: Midge Aver MSN RN CNS WTA Entered By: Midge Aver on 10/15/2022 16:40:19

## 2022-10-16 NOTE — Progress Notes (Signed)
TEASHIA, TRZASKA ([id number]) [id number] Page 1 of 8 Visit Report for 10/15/2022 HPI Details Patient Name: Date of Service: [name] 10/15/2022 2:45 PM Medical Record Number: [id number] Patient Account Number: [id number] Date of Birth/Sex: Treating RN: Mar 14, 1951 (72 y.o. [name] Primary Care Provider: [name] Other Clinician: Referring Provider: Treating Provider/Extender: Chauncey Mann, MICHA EL Ilean Skill Weeks in Treatment: 2 History of Present Illness HPI Description: 06/28/20 upon evaluation today patient appears to be doing unfortunately somewhat poorly in regard to wounds that she has over her lower extremities. She has a wound on the left distal second toe, left posterior heel, and right anterior lower leg. With that being said all in all none of the wounds appear to be too bad the one that hurts her the most is actually the wound on the posterior heel which actually is also one of the smallest. Nonetheless I think there is some callus hiding what is really going on in this area. Fortunately there does not appear to be any obvious evidence of infection which is great news. Patient does have a history of diabetes mellitus type 2, chronic venous insufficiency, hypertension, history of DVT and is on long-term anticoagulant s, therapy, Eliquis. 07/05/20 upon evaluation today patient appears to be doing well currently with regard to her wounds. I feel like she is making progress which is great news and overall there is no signs of active infection at this time. No fevers, chills, nausea, vomiting, or diarrhea. 4/29; patient has a only a small area remaining on the tip of her left second toe and a small area remaining on the left heel. The area on the right anterior lower leg is healed. They tell me that she had remote podiatric surgery on the toes of the left foot however they are very fixed in position and I wonder whether they are making it  difficult to relieve pressure in her foot wear. She is a type II diabetic with neuropathy as well. 08/01/2020 upon evaluation today patient appears to be doing well currently in regard to her wounds. In fact everything appears to be doing much better. The posterior aspect of her heel is actually giving her some trouble not the Achilles area but a small wound that we did not even have marked. In fact I had noted this before. Nonetheless we are going to addressed that today. 5/27; most of the patient's wounds are healed including the left Achilles heel left toe. Right forearm is also healed. She has a new abrasion posteriorly in the right elbow area just above the olecranon this happened today with an abrasion from her car door 6/15; the patient's wounds on the left Achilles and left second toe remain healed. The area on the right forearm is also just about healed in fact the skin I replaced last time is looking viable which is good. She had me look at the left heel again which is not in the area of the wound more towards the tip of the heel at the Achilles insertion site as well as the tip of the left foot second toe. In the Achilles area that is clearly is friction probably in bed at night. Not really near where the wound was. The left second toe remains healed although this is hammered and overhanging first toe also puts pressure in this area 09/13/2020 upon evaluation today patient appears to be doing well with regard to all previous wounds that she had. Everything is completely  healed. With that being said I do think that the patient unfortunately has a new skin tear on the right forearm which is due to her put a Band-Aid on it and then when it pulled off this caused some issues with skin tearing. Nonetheless I think that this needs to be addressed today. Hopefully will take too long to get this to heal. She is having some issues with pain in her heel but I think this is more related to be honest to  her neuropathy as opposed to anything else. Readmission: 10/30/2020 upon evaluation today patient appears to be doing poorly in regard to her legs bilaterally. She is also having an issue with her fourth toe on the left. Fortunately there does not appear to be any signs of active infection at this time. No fevers, chills, nausea, vomiting, or diarrhea. It is actually been quite a while since we have seen her. In fact June 24 was the last time that she was here in the clinic. She tells me she did not come back because things were just doing "pretty well". 11/29/2020 upon evaluation today patient actually appears to be doing well with regard to her arms those are completely healed. Her right leg is still open but doing okay. The fourth toe of the left foot does show some signs here of having some issues with necrotic tissue of an open wound. This is where a toenail was removed by podiatry. Subsequently I do think that we need to go ahead and see what we can do about getting this cleared away so being try to get some healing going forward. 12/13/2020 upon evaluation today patient appears to be doing decently well in regard to her wound on the right leg. In general I think that she is making good progress here. Unfortunately she continues to have significant issues with swelling in the left leg is now even more swollen at this point. For that reason I do believe she would be benefited by wrapping both sides although she is not interested in doing this. Overall I think that she is definitely making some good progress here nonetheless in regard to the right leg left leg leave some to be desired. 12/27/2020 upon evaluation today patient appears to be doing well with regard to her wound in regard to the legs. Unfortunately the toe was not doing nearly as well. I am much more concerned about infection and osteomyelitis here. She had the MRI but right now were not seeing obvious evidence of redness spreading up to  her foot this is good news I do not have the result of the MRI as of yet. I am good to place her on doxycycline however due to the fact that the wound does seem to be getting a little worse in regard to her toe. 01/14/2021 upon evaluation today patient appears to be doing well with regard to her ulcers. The right leg is doing well and even the toe seems to be doing well though she still has some evidence of bone exposed on the toe which does not appear to be doing quite as good as I would like to see just and the fact that is still not covered over new tissue but looks tremendously better compared to 2 weeks ago. Overall very pleased in that regard. 01/28/2021 upon evaluation today patient's leg on the left actually appears to have new wounds this is due to her deciding to shave yesterday. Fortunately there does not appear to be any signs  of infection currently. This is good news. Nonetheless I do believe that with these new areas open it would be best to wrap her leg to try to make sure we get this under control sooner rather than later. With regard to her toe there still does appear to be some evidence of bone exposed this was covered over with callus I did remove that today. Nonetheless I think this is significantly smaller than previous. THANYA, VOEKS ([id number]) [id number] Page 2 of 8 02/11/2021 upon evaluation today patient appears to be doing okay in regard to her leg ulcers. I do not see anything obvious at this point this seems to be a complicating factor. I think that she is getting better. Were wrap of the right leg not the left leg. With that being said the left fourth toe ulcer still does have evidence of being open there is some drainage still on top of the wound I think we can to try to see about using something like a collagen dressing today to see if that would be of benefit for her. She is in agreement with giving this a trial. Nonetheless I am concerned  about the fact that she continues to have issues here with this not healing it is confirmed chronic osteomyelitis she has been on antibiotics. I am going to extend those today as well. That is Keflex and that will be for 1 additional month. Nonetheless if she is not doing significantly better by the next time I see her I think that the biggest thing is going to be for [place] to go ahead and see what we can do about getting her started with hyperbaric oxygen therapy to try to save the toe. 02/20/2021 upon evaluation today patient presents for follow-up she actually came in a little bit early as the home health nurse was concerned about her toe becoming "gangrenous". With that being said the good news is there is [place] no evidence of this and in fact the toe seems to be doing better at this point. What was over the toe was an area that had bled significantly and then subsequently dried making it look much worse than where things actually stand. There is just a very small opening remaining in the toe. In regard to her legs everything appears to be healed bilaterally. 02/24/2021 upon evaluation today patient appears to be doing well with regard to her toe ulcer. I am actually very pleased with where we stand and I think that she is making good progress at this time. There is still a very small opening although I think the biggest issue is the collagen seems to be getting stuck and drying up because the wound is so small we probably just need to use something to keep this moist like a little piece of Xeroform gauze and see if we get this to close. 03/07/2021 upon evaluation patient actually appears to be making excellent progress in regard to her last wound on the toe. There is just a very tiny potential area that I can even be sure at first until I get some of the dry skin off when I did get this removed there was just a very tiny pinpoint opening that was barely even moist. Actually feel like this is very  close to complete resolution. She has been using the Xeroform that is doing great. 03/25/2021 upon evaluation today patient appears to be doing well with regard to her toe ulcer. In fact this appears to be potentially completely healed.  Fortunately I do not see anything draining at this point which is great news. She does have a small blood blister that is already drying up and looking okay on the third toe as well I am not sure what happened here she has an either. Otherwise she is having some weeping from the right leg this again is newer she tells me from last week weighing in that her pulmonologist and then today weighing in at the hematologist that she gained 30 pounds. This is quite significant if indeed true and I think she is to contact her primary care provider ASAP. This would account for why her right leg is weeping as well. 04/04/2020 upon evaluation today patient appears to be doing excellent in regard to her toe which I actually think is healed today. With that being said she unfortunately is having issues with her leg swelling and again several blisters on each leg that have reopened unfortunately. 05/02/2021 upon evaluation today patient appears to be doing well with regard to her wounds. In fact is down just 1 on the anterior portion of her right leg which is actually from a blister. Otherwise she is doing awesome. 05/15/2021 upon evaluation today patient appears to be doing better in regard to her leg ulcer although she has a small irritated area from tape on the leg medially. She also has a skin tear to her right elbow region. Fortunately there does not appear to be any signs of active infection locally nor systemically at this point which is great news. No fevers, chills, nausea, vomiting, or diarrhea. 05/26/2021 upon evaluation today patient appears to be doing better in regard to the actual wounds although she is having a lot of leaking from the right leg. She is also been using her  lymphedema pumps along with wearing her compression. Nonetheless she is having a lot of trouble with her breathing I am concerned that the pumps may be contributing to this. 06/12/2021 upon evaluation today patient appears to be doing better in regard to a lot of things including her breathing as well as her legs. She still has a lot of swelling but the good news is this is better she lost about 20 pounds of fluid when she went to the hospital and they diuresed her. Subsequently this is good news. Nonetheless she actually told me that "if she ever gets that bad again for me to yell at her to go to the hospital". She said that I was not quite forceful enough and telling her what she needed to do. She also notes she will try to be more receptive in the future. 06/19/2021 upon evaluation today patient appears to be doing well with regard to her wound. She is tolerating the dressing changes without complication. Fortunately her legs on the right are completely healed. She did have an area that she hit her Achilles on the left but this too seems to be pretty much closed at this point. Readmission: 02-05-2022 upon evaluation today patient appears to be doing well currently in regard to her left leg although her right leg is actually giving her quite a bit more trouble at this point. Fortunately there does not appear to be any signs of infection locally or systemically which is great news. No fevers, chills, nausea, vomiting, or diarrhea. Patient tells me that she began having issues more recently with her legs in the right leg is definitely worse than the left as far as the way she feels as well and she  states it was around September 16 that she first noted this and then when she called it took about 2-1/2 weeks to get in to see me. Her past medical history really has not changed since I saw her back in March everything is pretty much about the same repeat ABIs appear to be doing well currently at 1.1 on the  left and 1.12 on the right. 02-10-2022 upon evaluation today patient actually appears to be doing well. Culture actually showed evidence of Pseudomonas but her legs look amazing today I do not see any signs of infection I do not believe that we need to add anything especially in light of the fact there is a lot of interactions with anything to treat the Pseudomonas in her current medication regimen. For that reason we will just hold with where we stand and continue with the current therapy. 02-24-2022 upon evaluation today patient appears to be doing excellent in fact she appears to be completely healed based on what we are seeing. Readmission: 10-01-2022 upon evaluation today patient presents for follow-up evaluation here in the clinic she has not been seen since December 2023. At that point she was actually being seen for her legs that is actually doing much better which is great news. Overall very pleased with her leg situation and she has been doing great with her compression socks. With that being said unfortunately she does seem to be having issues with her heel this was secondary to having her left shoulder replacement and subsequent to that she was in the hospital for 6 days during that time she developed a pressure wound on the left heel. Unfortunately that is now it is giving her the most trouble she also has some irritation of the right heel but is not nearly as significant as far as that is concerned there is no signs of active infection at this time which is great news and very pleased in that regard. 10-09-2022 upon evaluation today patient appears to be doing well currently in regard to her heel we are definitely seeing signs of improvement which is great news and in general I think that we are moving in the right direction. I do not see any evidence of worsening overall which is great news. 7/25; pressure ulcer at the tip of her heel she has been using Promogran and sit to foot with a  border foam. She is a type II diabetic. She is attempting to religiously offload this area with Prevalon boots at night open heeled shoes during the day. Her measurements are a lot better today per our intake nurse Electronic Signature(s) Signed: 10/16/2022 7:34:23 AM By: Baltazar Najjar MD Entered By: Baltazar Najjar on 10/15/2022 15:31:35 Leonel Ramsay ([id number]) [id number] Page 3 of 8 -------------------------------------------------------------------------------- Physical Exam Details Patient Name: Date of Service: [name] 10/15/2022 2:45 PM Medical Record Number: [id number] Patient Account Number: [id number] Date of Birth/Sex: Treating RN: Nov 19, 1950 (72 y.o. [name] Primary Care Provider: [name] Other Clinician: Referring Provider: Treating Provider/Extender: RO BSO N, MICHA EL Ilean Skill Weeks in Treatment: 2 Constitutional Sitting or standing Blood Pressure is within target range for patient.. Pulse regular and within target range for patient.Marland Kitchen Respirations regular, non-labored and within target range.. Temperature is normal and within the target range for the patient.Marland Kitchen appears in no distress. Notes Wound exam; small circular wound near the tip of her left heel. Under illumination the surface actually does not look quite as vibrant as I might like  nevertheless the dimensions are impressively better. There is no evidence of infection. Electronic Signature(s) Signed: 10/16/2022 7:34:23 AM By: Baltazar Najjar MD Entered By: Baltazar Najjar on 10/15/2022 15:32:56 -------------------------------------------------------------------------------- Physician Orders Details Patient Name: Date of Service: [name], [name] 10/15/2022 2:45 PM Medical Record Number: [id number] Patient Account Number: [id number] Date of Birth/Sex: Treating RN: 1951-02-10 (72 y.o. [name] Primary Care Provider: [name] Other  Clinician: Referring Provider: Treating Provider/Extender: Chauncey Mann, MICHA EL Ilean Skill Weeks in Treatment: 2 Verbal / Phone Orders: No Diagnosis Coding ICD-10 Coding Code Description E11.621 Type 2 diabetes mellitus with foot ulcer I87.323 Chronic venous hypertension (idiopathic) with inflammation of bilateral lower extremity L89.623 Pressure ulcer of left heel, stage 3 L89.616 Pressure-induced deep tissue damage of right heel Z79.01 Long term (current) use of anticoagulants I48.0 Paroxysmal atrial fibrillation Follow-up Appointments Return Appointment in 1 week. Home [facility] for wound care. May utilize formulary equivalent dressing for wound treatment orders unless otherwise specified. EVONE, WENNINGER ([id number]) [id number] Page 4 of 8 Home Health Nurse may visit PRN to address patients wound care needs. Rolene Arbour for wound care to left calcan [phone] Anesthetic (Use 'Patient Medications' Section for Anesthetic Order Entry) Lidocaine applied to wound bed Edema Control - Lymphedema / Segmental Compressive Device / Other Elevate, Exercise Daily and A void Standing for Long Periods of Time. Elevate legs to the level of the heart and pump ankles as often as possible Elevate leg(s) parallel to the floor when sitting. Wound Treatment Wound #24 - Calcaneus Wound Laterality: Left Cleanser: Normal Saline 3 x Per Week/30 Days Discharge Instructions: Wash your hands with soap and water. Remove old dressing, discard into plastic bag and place into trash. Cleanse the wound with Normal Saline prior to applying a clean dressing using gauze sponges, not tissues or cotton balls. Do not scrub or use excessive force. Pat dry using gauze sponges, not tissue or cotton balls. Cleanser: Soap and Water 3 x Per Week/30 Days Discharge Instructions: Gently cleanse wound with antibacterial soap, rinse and pat dry prior to dressing wounds Prim  Dressing: Promogran Matrix 4.34 (in) 3 x Per Week/30 Days ary Discharge Instructions: Moisten w/normal saline or sterile water; Cover wound as directed. Do not remove from wound bed. Secondary Dressing: (BORDER) Zetuvit Plus SILICONE BORDER Dressing 4x4 (in/in) 3 x Per Week/30 Days Discharge Instructions: Please do not put silicone bordered dressings under wraps. Use non-bordered dressing only. Electronic Signature(s) Signed: 10/15/2022 4:43:06 PM By: Midge Aver MSN RN CNS WTA Signed: 10/16/2022 7:34:23 AM By: Baltazar Najjar MD Entered By: Midge Aver on 10/15/2022 16:40:48 -------------------------------------------------------------------------------- Problem List Details Patient Name: Date of Service: [name], [place] 10/15/2022 2:45 PM Medical Record Number: [id number] Patient Account Number: [id number] Date of Birth/Sex: Treating RN: June 12, 1950 (72 y.o. [name] Primary Care Provider: [name] Other Clinician: Referring Provider: Treating Provider/Extender: RO BSO Dorris Carnes, MICHA EL Ilean Skill Weeks in Treatment: 2 Active Problems ICD-10 Encounter Code Description Active Date MDM Diagnosis E11.621 Type 2 diabetes mellitus with foot ulcer 10/01/2022 No Yes I87.323 Chronic venous hypertension (idiopathic) with inflammation of bilateral lower 10/01/2022 No Yes extremity L89.623 Pressure ulcer of left heel, stage 3 10/01/2022 No Yes L89.616 Pressure-induced deep tissue damage of right heel 10/01/2022 No Yes LAURANA, BATTEY B ([id number]) [id number] Page 5 of 8 Z79.01 Long term (current) use of anticoagulants 10/01/2022 No Yes I48.0 Paroxysmal atrial fibrillation 10/01/2022 No Yes Inactive Problems Resolved Problems Electronic Signature(s) Signed: 10/16/2022  7:34:23 AM By: Baltazar Najjar MD Entered By: Baltazar Najjar on 10/15/2022 15:29:41 -------------------------------------------------------------------------------- Progress Note  Details Patient Name: Date of Service: [name], [name] 10/15/2022 2:45 PM Medical Record Number: [id number] Patient Account Number: [id number] Date of Birth/Sex: Treating RN: Dec 15, 1950 (72 y.o. [name] Primary Care Provider: [name] Other Clinician: Referring Provider: Treating Provider/Extender: Chauncey Mann, MICHA EL Ilean Skill Weeks in Treatment: 2 Subjective History of Present Illness (HPI) 06/28/20 upon evaluation today patient appears to be doing unfortunately somewhat poorly in regard to wounds that she has over her lower extremities. She has a wound on the left distal second toe, left posterior heel, and right anterior lower leg. With that being said all in all none of the wounds appear to be too bad the one that hurts her the most is actually the wound on the posterior heel which actually is also one of the smallest. Nonetheless I think there is some callus hiding what is really going on in this area. Fortunately there does not appear to be any obvious evidence of infection which is great news. Patient does have a history of diabetes mellitus type 2, chronic venous insufficiency, hypertension, history of DVT and is on long-term anticoagulant therapy, Eliquis. s, 07/05/20 upon evaluation today patient appears to be doing well currently with regard to her wounds. I feel like she is making progress which is great news and overall there is no signs of active infection at this time. No fevers, chills, nausea, vomiting, or diarrhea. 4/29; patient has a only a small area remaining on the tip of her left second toe and a small area remaining on the left heel. The area on the right anterior lower leg is healed. They tell me that she had remote podiatric surgery on the toes of the left foot however they are very fixed in position and I wonder whether they are making it difficult to relieve pressure in her foot wear. She is a type II diabetic with neuropathy as well. 08/01/2020 upon  evaluation today patient appears to be doing well currently in regard to her wounds. In fact everything appears to be doing much better. The posterior aspect of her heel is actually giving her some trouble not the Achilles area but a small wound that we did not even have marked. In fact I had noted this before. Nonetheless we are going to addressed that today. 5/27; most of the patient's wounds are healed including the left Achilles heel left toe. Right forearm is also healed. She has a new abrasion posteriorly in the right elbow area just above the olecranon this happened today with an abrasion from her car door 6/15; the patient's wounds on the left Achilles and left second toe remain healed. The area on the right forearm is also just about healed in fact the skin I replaced last time is looking viable which is good. She had me look at the left heel again which is not in the area of the wound more towards the tip of the heel at the Achilles insertion site as well as the tip of the left foot second toe. In the Achilles area that is clearly is friction probably in bed at night. Not really near where the wound was. The left second toe remains healed although this is hammered and overhanging first toe also puts pressure in this area 09/13/2020 upon evaluation today patient appears to be doing well with regard to all previous wounds that she had.  Everything is completely healed. With that being said I do think that the patient unfortunately has a new skin tear on the right forearm which is due to her put a Band-Aid on it and then when it pulled off this caused some issues with skin tearing. Nonetheless I think that this needs to be addressed today. Hopefully will take too long to get this to heal. She is having some issues with pain in her heel but I think this is more related to be honest to her neuropathy as opposed to anything else. Readmission: 10/30/2020 upon evaluation today patient appears to be  doing poorly in regard to her legs bilaterally. She is also having an issue with her fourth toe on the left. Fortunately there does not appear to be any signs of active infection at this time. No fevers, chills, nausea, vomiting, or diarrhea. It is actually been quite a while since we have seen her. In fact June 24 was the last time that she was here in the clinic. She tells me she did not come back because things were just doing "pretty well". 11/29/2020 upon evaluation today patient actually appears to be doing well with regard to her arms those are completely healed. Her right leg is still open but doing okay. The fourth toe of the left foot does show some signs here of having some issues with necrotic tissue of an open wound. This is where a toenail was removed by podiatry. Subsequently I do think that we need to go ahead and see what we can do about getting this cleared away so being try to get some GENIA, KAMINSKA B ([id number]) [id number] Page 6 of 8 healing going forward. 12/13/2020 upon evaluation today patient appears to be doing decently well in regard to her wound on the right leg. In general I think that she is making good progress here. Unfortunately she continues to have significant issues with swelling in the left leg is now even more swollen at this point. For that reason I do believe she would be benefited by wrapping both sides although she is not interested in doing this. Overall I think that she is definitely making some good progress here nonetheless in regard to the right leg left leg leave some to be desired. 12/27/2020 upon evaluation today patient appears to be doing well with regard to her wound in regard to the legs. Unfortunately the toe was not doing nearly as well. I am much more concerned about infection and osteomyelitis here. She had the MRI but right now were not seeing obvious evidence of redness spreading up to her foot this is good news I do  not have the result of the MRI as of yet. I am good to place her on doxycycline however due to the fact that the wound does seem to be getting a little worse in regard to her toe. 01/14/2021 upon evaluation today patient appears to be doing well with regard to her ulcers. The right leg is doing well and even the toe seems to be doing well though she still has some evidence of bone exposed on the toe which does not appear to be doing quite as good as I would like to see just and the fact that is still not covered over new tissue but looks tremendously better compared to 2 weeks ago. Overall very pleased in that regard. 01/28/2021 upon evaluation today patient's leg on the left actually appears to have new wounds this is due to her deciding  to shave yesterday. Fortunately there does not appear to be any signs of infection currently. This is good news. Nonetheless I do believe that with these new areas open it would be best to wrap her leg to try to make sure we get this under control sooner rather than later. With regard to her toe there still does appear to be some evidence of bone exposed this was covered over with callus I did remove that today. Nonetheless I think this is significantly smaller than previous. 02/11/2021 upon evaluation today patient appears to be doing okay in regard to her leg ulcers. I do not see anything obvious at this point this seems to be a complicating factor. I think that she is getting better. Were wrap of the right leg not the left leg. With that being said the left fourth toe ulcer still does have evidence of being open there is some drainage still on top of the wound I think we can to try to see about using something like a collagen dressing today to see if that would be of benefit for her. She is in agreement with giving this a trial. Nonetheless I am concerned about the fact that she continues to have issues here with this not healing it is confirmed chronic osteomyelitis  she has been on antibiotics. I am going to extend those today as well. That is Keflex and that will be for 1 additional month. Nonetheless if she is not doing significantly better by the next time I see her I think that the biggest thing is going to be for [place] to go ahead and see what we can do about getting her started with hyperbaric oxygen therapy to try to save the toe. 02/20/2021 upon evaluation today patient presents for follow-up she actually came in a little bit early as the home health nurse was concerned about her toe becoming "gangrenous". With that being said the good news is there is [place] no evidence of this and in fact the toe seems to be doing better at this point. What was over the toe was an area that had bled significantly and then subsequently dried making it look much worse than where things actually stand. There is just a very small opening remaining in the toe. In regard to her legs everything appears to be healed bilaterally. 02/24/2021 upon evaluation today patient appears to be doing well with regard to her toe ulcer. I am actually very pleased with where we stand and I think that she is making good progress at this time. There is still a very small opening although I think the biggest issue is the collagen seems to be getting stuck and drying up because the wound is so small we probably just need to use something to keep this moist like a little piece of Xeroform gauze and see if we get this to close. 03/07/2021 upon evaluation patient actually appears to be making excellent progress in regard to her last wound on the toe. There is just a very tiny potential area that I can even be sure at first until I get some of the dry skin off when I did get this removed there was just a very tiny pinpoint opening that was barely even moist. Actually feel like this is very close to complete resolution. She has been using the Xeroform that is doing great. 03/25/2021 upon evaluation today  patient appears to be doing well with regard to her toe ulcer. In fact this appears to be  potentially completely healed. Fortunately I do not see anything draining at this point which is great news. She does have a small blood blister that is already drying up and looking okay on the third toe as well I am not sure what happened here she has an either. Otherwise she is having some weeping from the right leg this again is newer she tells me from last week weighing in that her pulmonologist and then today weighing in at the hematologist that she gained 30 pounds. This is quite significant if indeed true and I think she is to contact her primary care provider ASAP. This would account for why her right leg is weeping as well. 04/04/2020 upon evaluation today patient appears to be doing excellent in regard to her toe which I actually think is healed today. With that being said she unfortunately is having issues with her leg swelling and again several blisters on each leg that have reopened unfortunately. 05/02/2021 upon evaluation today patient appears to be doing well with regard to her wounds. In fact is down just 1 on the anterior portion of her right leg which is actually from a blister. Otherwise she is doing awesome. 05/15/2021 upon evaluation today patient appears to be doing better in regard to her leg ulcer although she has a small irritated area from tape on the leg medially. She also has a skin tear to her right elbow region. Fortunately there does not appear to be any signs of active infection locally nor systemically at this point which is great news. No fevers, chills, nausea, vomiting, or diarrhea. 05/26/2021 upon evaluation today patient appears to be doing better in regard to the actual wounds although she is having a lot of leaking from the right leg. She is also been using her lymphedema pumps along with wearing her compression. Nonetheless she is having a lot of trouble with her breathing I am  concerned that the pumps may be contributing to this. 06/12/2021 upon evaluation today patient appears to be doing better in regard to a lot of things including her breathing as well as her legs. She still has a lot of swelling but the good news is this is better she lost about 20 pounds of fluid when she went to the hospital and they diuresed her. Subsequently this is good news. Nonetheless she actually told me that "if she ever gets that bad again for me to yell at her to go to the hospital". She said that I was not quite forceful enough and telling her what she needed to do. She also notes she will try to be more receptive in the future. 06/19/2021 upon evaluation today patient appears to be doing well with regard to her wound. She is tolerating the dressing changes without complication. Fortunately her legs on the right are completely healed. She did have an area that she hit her Achilles on the left but this too seems to be pretty much closed at this point. Readmission: 02-05-2022 upon evaluation today patient appears to be doing well currently in regard to her left leg although her right leg is actually giving her quite a bit more trouble at this point. Fortunately there does not appear to be any signs of infection locally or systemically which is great news. No fevers, chills, nausea, vomiting, or diarrhea. Patient tells me that she began having issues more recently with her legs in the right leg is definitely worse than the left as far as the way she feels as  well and she states it was around September 16 that she first noted this and then when she called it took about 2-1/2 weeks to get in to see me. Her past medical history really has not changed since I saw her back in March everything is pretty much about the same repeat ABIs appear to be doing well currently at 1.1 on the left and 1.12 on the right. 02-10-2022 upon evaluation today patient actually appears to be doing well. Culture  actually showed evidence of Pseudomonas but her legs look amazing today I do not see any signs of infection I do not believe that we need to add anything especially in light of the fact there is a lot of interactions with anything to treat the Pseudomonas in her current medication regimen. For that reason we will just hold with where we stand and continue with the current therapy. 02-24-2022 upon evaluation today patient appears to be doing excellent in fact she appears to be completely healed based on what we are seeing. Readmission: 10-01-2022 upon evaluation today patient presents for follow-up evaluation here in the clinic she has not been seen since December 2023. At that point she was ZHARIAH, ZALES ([id number]) [id number] Page 7 of 8 actually being seen for her legs that is actually doing much better which is great news. Overall very pleased with her leg situation and she has been doing great with her compression socks. With that being said unfortunately she does seem to be having issues with her heel this was secondary to having her left shoulder replacement and subsequent to that she was in the hospital for 6 days during that time she developed a pressure wound on the left heel. Unfortunately that is now it is giving her the most trouble she also has some irritation of the right heel but is not nearly as significant as far as that is concerned there is no signs of active infection at this time which is great news and very pleased in that regard. 10-09-2022 upon evaluation today patient appears to be doing well currently in regard to her heel we are definitely seeing signs of improvement which is great news and in general I think that we are moving in the right direction. I do not see any evidence of worsening overall which is great news. 7/25; pressure ulcer at the tip of her heel she has been using Promogran and sit to foot with a border foam. She is a type II  diabetic. She is attempting to religiously offload this area with Prevalon boots at night open heeled shoes during the day. Her measurements are a lot better today per our intake nurse Objective Constitutional Sitting or standing Blood Pressure is within target range for patient.. Pulse regular and within target range for patient.Marland Kitchen Respirations regular, non-labored and within target range.. Temperature is normal and within the target range for the patient.Marland Kitchen appears in no distress. Vitals Time Taken: 3:10 PM, Height: 64 in, Weight: 250 lbs, BMI: 42.9, Temperature: 97.8 F, Pulse: 53 bpm, Respiratory Rate: 18 breaths/min, Blood Pressure: 116/68 mmHg. General Notes: Wound exam; small circular wound near the tip of her left heel. Under illumination the surface actually does not look quite as vibrant as I might like nevertheless the dimensions are impressively better. There is no evidence of infection. Integumentary (Hair, Skin) Wound #24 status is Open. Original cause of wound was Gradually Appeared. The date acquired was: 08/22/2022. The wound has been in treatment 2 weeks. The wound is located  on the Left Calcaneus. The wound measures 1cm length x 1.2cm width x 0.1cm depth; 0.942cm^2 area and 0.094cm^3 volume. There is Fat Layer (Subcutaneous Tissue) exposed. There is a small amount of serous drainage noted. The wound margin is distinct with the outline attached to the wound base. There is small (1-33%) red granulation within the wound bed. There is a medium (34-66%) amount of necrotic tissue within the wound bed including Adherent Slough and Necrosis of Bone. Assessment Active Problems ICD-10 Type 2 diabetes mellitus with foot ulcer Chronic venous hypertension (idiopathic) with inflammation of bilateral lower extremity Pressure ulcer of left heel, stage 3 Pressure-induced deep tissue damage of right heel Long term (current) use of anticoagulants Paroxysmal atrial fibrillation Plan 1. No change  to the primary dressing which is Promogran. 2. The wound surface did not look as vibrant as I might like under illumination however with the improvement in dimensions I did not go ahead with any further mechanical debridement Electronic Signature(s) Signed: 10/16/2022 7:34:23 AM By: Baltazar Najjar MD Entered By: Baltazar Najjar on 10/15/2022 15:33:43 Leonel Ramsay ([id number]) [id number] Page 8 of 8 -------------------------------------------------------------------------------- SuperBill Details Patient Name: Date of Service: [name] 10/15/2022 Medical Record Number: [id number] Patient Account Number: [id number] Date of Birth/Sex: Treating RN: [birth year] (72 y.o. [name] Primary Care Provider: [name] Other Clinician: Referring Provider: Treating Provider/Extender: Chauncey Mann, MICHA EL Ilean Skill Weeks in Treatment: 2 Diagnosis Coding ICD-10 Codes Code Description E11.621 Type 2 diabetes mellitus with foot ulcer I87.323 Chronic venous hypertension (idiopathic) with inflammation of bilateral lower extremity L89.623 Pressure ulcer of left heel, stage 3 L89.616 Pressure-induced deep tissue damage of right heel Z79.01 Long term (current) use of anticoagulants I48.0 Paroxysmal atrial fibrillation Facility Procedures : CPT4 Code: [id number] Description: 99213 - WOUND CARE VISIT-LEV 3 EST PT Modifier: Quantity: 1 Physician Procedures : CPT4 Code Description Modifier [id number] 99213 - WC PHYS LEVEL 3 - EST PT ICD-10 Diagnosis Description E11.621 Type 2 diabetes mellitus with foot ulcer L89.623 Pressure ulcer of left heel, stage 3 Quantity: 1 Electronic Signature(s) Signed: 10/15/2022 4:40:59 PM By: Midge Aver MSN RN CNS WTA Signed: 10/16/2022 7:34:23 AM By: Baltazar Najjar MD Entered By: Midge Aver on 10/15/2022 16:40:59

## 2022-10-19 ENCOUNTER — Telehealth: Payer: Self-pay

## 2022-10-19 NOTE — Telephone Encounter (Signed)
[facility] orders have been received via fax. They have been placed in provider to be signed folder.

## 2022-10-20 ENCOUNTER — Encounter: Payer: Self-pay | Admitting: Pulmonary Disease

## 2022-10-20 ENCOUNTER — Ambulatory Visit: Payer: Medicare HMO | Admitting: Pulmonary Disease

## 2022-10-20 VITALS — BP 110/70 | HR 72 | Temp 97.8°F | Ht 64.0 in | Wt 251.0 lb

## 2022-10-20 DIAGNOSIS — Z6841 Body Mass Index (BMI) 40.0 and over, adult: Secondary | ICD-10-CM | POA: Diagnosis not present

## 2022-10-20 DIAGNOSIS — J4540 Moderate persistent asthma, uncomplicated: Secondary | ICD-10-CM

## 2022-10-20 DIAGNOSIS — J986 Disorders of diaphragm: Secondary | ICD-10-CM | POA: Diagnosis not present

## 2022-10-20 DIAGNOSIS — G4733 Obstructive sleep apnea (adult) (pediatric): Secondary | ICD-10-CM | POA: Diagnosis not present

## 2022-10-20 MED ORDER — ALBUTEROL SULFATE HFA 108 (90 BASE) MCG/ACT IN AERS: 2.0000 | INHALATION_SPRAY | Freq: Four times a day (QID) | RESPIRATORY_TRACT | 4 refills | Status: AC | PRN

## 2022-10-20 NOTE — Patient Instructions (Signed)
You are doing well with your CPAP continue using it.  Continue your rehabilitation.  We have sent prescription for albuterol inhaler to your pharmacy.  We will see you in follow-up in 3 months time call sooner should any new problems arise.

## 2022-10-20 NOTE — Telephone Encounter (Signed)
Form has been faxed back to [facility] at the given fax number [phone] with a completed transmission log. A copy has also been placed in folder to have it scanned to pts chart

## 2022-10-20 NOTE — Progress Notes (Signed)
Subjective:    Patient ID: [name], adult    DOB: 26-Feb-1951, 72 y.o.   MRN: [id number]  Patient Care Team: [name], NP as PCP - General (Nurse Practitioner) Iran Ouch, MD as PCP - Cardiology (Cardiology) Delano Metz, MD as Consulting Physician (Pain Medicine) Marykay Lex, MD as Consulting Physician (Cardiology) Poggi, Excell Seltzer, MD as Consulting Physician (Surgery) Creig Hines, MD as Consulting Physician (Oncology) Signa Kell, MD as Consulting Physician (Orthopedic Surgery)  Chief Complaint  Patient presents with   Follow-up    No SOB or wheezing. Little cough.     HPI Patient is a 72 year old lifelong never smoker who carries a diagnosis of asthmatic bronchitis who presents for follow-up.  I last saw the patient on 30 Jul 2022 reoperative assessment given that she needed reverse shoulder replacement on the left.  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  She underwent pulmonary function testing on 11 Aug 2022 that showed no significant changes from her 2020 study.  The patient underwent reverse shoulder replacement on the left on 11 June discharge from [facility] on 17 June to a rehab facility.  She did well in rehab.  She also did well postoperatively.  The patient is maintained on Trelegy Ellipta 201 inhalation daily and as needed albuterol.  She is compliant with an Acapella flutter device for secretion management and she states that she uses this on a daily basis.  This has helped with her tenacious secretions.  She appears to be well compensated with regards to her asthmatic bronchitis, uses her albuterol rescue inhaler on the average twice a week.  She does not endorse dyspnea over baseline.  No wheezing, significant cough or sputum production.  No hemoptysis.     She has obstructive sleep apnea and is on  CPAP.  She is compliant with CPAP for her sleep apnea and notes that she sleeps well with the device.  Compliance report shows 100% usage over 30 days with 29 days of use over 4 hours for compliance percentage of 97%.  The patient is on CPAP at 11 cm H2O with an AHI of 1.8 on therapy.  DATA 12/18/2015 sleep study: Obstructive sleep apnea confirmed.  AHI 13.0 baseline and 37.5 during REM sleep. 09/22/2018 PFTs: FEV1 1.42 L or 67% predicted, FVC 1. 77 L or 63% predicted, FEV1/FVC 80%, no bronchodilator response.  Lung volumes mildly reduced, ERV greatly reduced at 12% consistent with obesity.  Diffusion capacity moderately reduced.  No overt obstruction noted, mild/moderate restriction likely due to obesity, known hemidiaphragm elevation. 09/03/2019 echocardiogram: LVEF 60 to 65%, no wall motion abnormality, no evidence of mitral valve regurgitation. 04/07/2020 echocardiogram with bubble study: LVEF 60 to 65%, no wall motion abnormalities.  Mild mitral valve regurgitation.  Mild to moderate tricuspid valve regurgitation.  Negative saline contrast study 06/03/2021 echocardiogram: LVEF 55 to 60%, no wall motion abnormalities, moderately elevated pulmonary artery systolic pressure.  Will size dilated.  Mild mitral regurgitation.  Moderate tricuspid valve regurgitation.  Right atrial pressure estimated at 8 mmHg. 08/11/2022 PFTs: FEV1 1.34 L or 63% predicted, FVC 1.50 L or 53% predicted, FEV1/FVC 89%, no bronchodilator response.  Lung volumes are moderately reduced.  Diffusion capacity moderately reduced and corrects partially for alveolar volume.  Overall no overt significant change from PFTs 2020.  Review of Systems A 10 point  review of systems was performed and it is as noted above otherwise negative.   Patient Active Problem List   Diagnosis Date Noted   Pressure ulcer of left heel, stage 3 (HCC) 10/07/2022   S/P reverse total shoulder arthroplasty, right 09/02/2022   S/p reverse total shoulder  arthroplasty 09/01/2022   Cutaneous candidiasis 08/13/2022   Yeast vaginitis 07/16/2022   Type 2 diabetes mellitus with hyperglycemia, without long-term current use of insulin (HCC) 06/16/2022   Bronchitis 03/26/2022   BMI 40.0-44.9, adult (HCC) 12/01/2021   Stage 3b chronic kidney disease (HCC) 12/01/2021   Dysuria 11/13/2021   Abnormal MRI, shoulder 10/29/2021   SOB (shortness of breath) 06/06/2021   Iron deficiency anemia 03/27/2021   Recurrent strokes (HCC) 02/26/2021   Chronic shoulder pain (Bilateral) 02/10/2021   Impaired range of motion of shoulder (Left) 02/10/2021   Hyperlipidemia LDL goal <70 01/28/2021   Allergic rhinitis 08/22/2020   Uncomplicated opioid dependence (HCC) 07/15/2020   Cognitive deficit, post-stroke 06/22/2020   Peripheral neurogenic pain 06/20/2020   Chronic peripheral neuropathic pain 06/20/2020   Chronic use of opiate for therapeutic purpose 06/20/2020   History of cerebrovascular accident (CVA) due to embolism 06/19/2020   History of stroke with current residual effects 06/05/2020   ANA positive 04/18/2020   Nonsustained ventricular tachycardia (HCC) 04/09/2020   B12 deficiency 03/11/2020   Ulcer of left heel and midfoot (HCC) 03/11/2020   PAD (peripheral artery disease) (HCC) 03/11/2020   Rotator cuff disorder, left 03/01/2020   Cerebrovascular accident (CVA) (HCC) 03/01/2020   Primary osteoarthritis of left shoulder 02/12/2020   Folate deficiency 02/08/2020   Vitamin D deficiency 02/08/2020   Malnutrition of moderate degree 02/06/2020   Left hemiparesis (HCC) 12/28/2019   Chronic bacteriuria 11/17/2019   Hyperkalemia 09/14/2019   Bacteremia due to Gram-positive bacteria 09/08/2019   Pressure injury of skin 09/04/2019   COPD with acute exacerbation (HCC) 09/02/2019   Pharmacologic therapy 08/30/2019   Recurrent falls 07/10/2019   Left ankle pain 06/02/2019   Chronic pain of right ankle 02/14/2019   Arthritis 02/14/2019   Meralgia  paresthetica (Right) 12/05/2018   Chronic anticoagulation (Eliquis) 12/05/2018   Chronic ulcer of right leg (HCC) 10/18/2018   Fibromyalgia syndrome 08/17/2018   Chronic musculoskeletal pain 08/17/2018   Tremor of right hand 08/03/2018   Overactive bladder 08/03/2018   Physical deconditioning 08/03/2018   Recurrent UTI 07/29/2018   Foot ulcer, left (HCC) 03/09/2018   Adrenal mass (HCC) 08/02/2017   Eczema 06/27/2017   Mass of both adrenal glands (HCC) 05/16/2017   Fatty liver 05/13/2017   Hip strain (Right) 04/30/2017   Ischial pain, right 04/30/2017   Hip joint pain (Left) 04/22/2017   Abnormality of gait and mobility 04/22/2017   Muscle strain of hip (Left) 04/09/2017   Pain in joint involving ankle and foot 11/18/2016   Kidney stones 11/18/2016   Spinal stenosis of thoracic region 11/18/2016   Abscess of tendon of left foot 09/21/2016   Cubital tunnel syndrome (Right) 08/07/2016   Chronic pain syndrome 02/25/2016   Long term current use of opiate analgesic 02/25/2016   Lymphedema 12/26/2015   Status post reverse total shoulder replacement 10/08/2015   Neuropathy of lateral femoral cutaneous nerve (Right) 09/23/2015   Obesity, Class III, BMI 40-49.9 (morbid obesity) (HCC) 09/23/2015   Chronic prescription benzodiazepine use 09/23/2015   Arthralgia 09/23/2015   Osteoarthritis involving multiple joints 09/23/2015   Other shoulder lesions (Right) 08/28/2015   Cervical radiculitis 08/06/2015   Opiate use (15  MME/Day) 01/16/2015   Impingement syndrome of shoulder region 01/07/2015   Mixed incontinence 11/26/2014   Atrophic vaginitis 11/26/2014   Depression with anxiety 09/21/2014   Bladder cystocele 09/21/2014   Gastro-esophageal reflux disease without esophagitis 09/21/2014   At high risk for falls 09/21/2014   Diabetic peripheral neuropathy associated with type 2 diabetes mellitus (HCC) 08/31/2014   Asthma, well controlled 08/31/2014   Hypothyroidism 08/31/2014    Peripheral vascular disease due to secondary diabetes mellitus (HCC) 12/11/2013   OSA (obstructive sleep apnea) 10/19/2013   Essential hypertension 07/27/2013   Chronic atrial fibrillation 08/10/2012    Social History   Tobacco Use   Smoking status: Never   Smokeless tobacco: Never  Substance Use Topics   Alcohol use: No    Alcohol/week: 0.0 standard drinks of alcohol    Allergies  Allergen Reactions   Morphine And Codeine Other (See Comments) and Nausea Only    Patient becomes very confused Other reaction(s): Other (See Comments), Other (See Comments) Patient becomes very confused Patient becomes very confused    Penicillins Hives, Shortness Of Breath, Swelling and Other (See Comments)    Tolerates Keflex and Ceftriaxone   Ambien [Zolpidem]     Hallucination    Aspirin Hives   Oxytetracycline Hives    Other reaction(s): Unknown     Current Meds  Medication Sig   acetaminophen (TYLENOL) 500 MG tablet Take 2 tablets (1,000 mg total) by mouth every 8 (eight) hours.   albuterol (VENTOLIN HFA) 108 (90 Base) MCG/ACT inhaler Inhale 2 puffs into the lungs every 6 (six) hours as needed.   ALPRAZolam (XANAX) 0.25 MG tablet Take 1 tablet (0.25 mg total) by mouth 2 (two) times daily as needed for anxiety. See SIG change was taking 1/2 of 0.5 tablet bid prn changed to 0.25 bid prn. D/c 0.5 xanax   buPROPion (WELLBUTRIN XL) 150 MG 24 hr tablet Take 1 tablet (150 mg total) by mouth daily.   calcium-vitamin D (OSCAL WITH D) 500-200 MG-UNIT tablet Take 1 tablet by mouth 2 (two) times daily with a meal.   cephALEXin (KEFLEX) 250 MG capsule Take 1 capsule (250 mg total) by mouth daily.   clobetasol cream (TEMOVATE) 0.05 % Apply 1 application topically 2 (two) times daily as needed (skin irritation).    colchicine 0.6 MG tablet Take 0.6 mg by mouth daily. Repeat I 1 hour if needed. No more than 3 tablets in 24 hours PRN for GOUT FLARE   Continuous Glucose Sensor (FREESTYLE LIBRE 3 SENSOR)  MISC    cyanocobalamin (VITAMIN B12) 1000 MCG tablet Take 1,000 mcg by mouth daily.   diclofenac Sodium (VOLTAREN) 1 % GEL Apply 2 g topically 3 (three) times daily.   DULoxetine (CYMBALTA) 60 MG capsule Take 1 capsule (60 mg total) by mouth daily. (Patient taking differently: Take 60 mg by mouth daily. 6 pm)   ELIQUIS 5 MG TABS tablet TAKE 1 TABLET BY MOUTH TWICE DAILY   empagliflozin (JARDIANCE) 25 MG TABS tablet Take 1 tablet (25 mg total) by mouth daily before breakfast.   ergocalciferol (VITAMIN D2) 1.25 MG (50000 UT) capsule Take 50,000 Units by mouth once a week.   estradiol (ESTRACE) 0.1 MG/GM vaginal cream Apply 0.5mg  (pea-sized amount)  just inside the vaginal introitus with a finger-tip on Monday, Wednesday and Friday nights,   ferrous sulfate 324 MG TBEC Take 324 mg by mouth daily with breakfast.   fluconazole (DIFLUCAN) 150 MG tablet Take 1 tablet by mouth x 1 dose. May  repeat in 72 hours if needed.   folic acid (FOLVITE) 800 MCG tablet Take 800 mcg by mouth daily.   furosemide (LASIX) 40 MG tablet Take 1 tablet (40 mg total) by mouth as directed. Take 1 tablet daily. May take additional 1 tablet If weight gain of greater than 2 pounds in 24 hours or 5 pounds in one week.   ipratropium-albuterol (DUONEB) 0.5-2.5 (3) MG/3ML SOLN Take 3 mLs by nebulization every 6 (six) hours as needed.   levocetirizine (XYZAL) 5 MG tablet TAKE ONE TABLET BY MOUTH AT BEDTIME AS NEEDED FOR ALLERGIES   levothyroxine (SYNTHROID) 137 MCG tablet Take 1 tablet (137 mcg total) by mouth daily before breakfast. 30 minutes do not mix with vitamins or acid reflux medications D/c 150 mcg   Loperamide-Simethicone 2-125 MG TABS Take 1 tablet by mouth daily as needed. May repeat in 1 hour   losartan (COZAAR) 50 MG tablet TAKE 1 TABLET BY MOUTH DAILY   Magnesium ([employer] TRIPLE MAGNESIUM COMPLEX) 400 MG CAPS Take 1 capsule by mouth daily.   Misc. Devices (TUB TRANSFER BOARD) MISC 1 Device by Does not apply route as needed.    montelukast (SINGULAIR) 10 MG tablet Take 1 tablet (10 mg total) by mouth at bedtime.   Multiple Vitamin (MULTIVITAMIN WITH MINERALS) TABS tablet Take 1 tablet by mouth daily.   Multiple Vitamins-Minerals (HAIR SKIN & NAILS) TABS Take 1 tablet by mouth daily.   naloxone (NARCAN) nasal spray 4 mg/0.1 mL Place 1 spray into the nose as needed for up to 365 doses (for opioid-induced respiratory depresssion). In case of emergency (overdose), spray once into each nostril. If no response within 3 minutes, repeat application and call 911.   nystatin (MYCOSTATIN/NYSTOP) powder Apply 1 Application topically 3 (three) times daily.   nystatin ointment (MYCOSTATIN) Apply 1 Application topically 2 (two) times daily.   ondansetron (ZOFRAN) 4 MG tablet Take 1 tablet (4 mg total) by mouth every 8 (eight) hours as needed for nausea or vomiting.   oxyCODONE (OXY IR/ROXICODONE) 5 MG immediate release tablet Take 1-2 tablets (5-10 mg total) by mouth every 4 (four) hours as needed for moderate pain (pain score 4-6).   pantoprazole (PROTONIX) 40 MG tablet Take 1 tablet (40 mg total) by mouth daily. 30 min before food   Powders (GOLD BOND NO MESS BODY POWDER) AERP Apply 1 application. topically daily as needed. Gold bond talc free powder   pregabalin (LYRICA) 50 MG capsule TAKE 1 CAPSULE BY MOUTH 3 TIMES DAILY   rosuvastatin (CRESTOR) 20 MG tablet TAKE 1 TABLET BY MOUTH DAILY   senna-docusate (SENOKOT-S) 8.6-50 MG tablet Take 1 tablet by mouth at bedtime as needed for mild constipation.   sucralfate (CARAFATE) 1 GM/10ML suspension Take 1 g by mouth 3 (three) times daily.    tirzepatide (MOUNJARO) 12.5 MG/0.5ML Pen Inject 12.5 mg into the skin once a week.   TRELEGY ELLIPTA 200-62.5-25 MCG/ACT AEPB INHALE ONE PUFF INTO THE LUNGS EVERY DAY   Vibegron (GEMTESA) 75 MG TABS Take 1 tablet (75 mg total) by mouth daily.    Immunization History  Administered Date(s) Administered   Fluad Quad(high Dose 65+) 02/07/2020,  02/06/2021, 11/27/2021   Influenza, High Dose Seasonal PF 11/28/2014, 02/02/2018, 01/31/2019, 02/20/2019   Influenza, Seasonal, Injecte, Preservative Fre 01/17/2011   Influenza,inj,Quad PF,6+ Mos 12/18/2013   Influenza-Unspecified 12/18/2013, 12/31/2015, 11/27/2016, 02/02/2018   PFIZER(Purple Top)SARS-COV-2 Vaccination 06/29/2019, 07/29/2019, 01/22/2020   Pneumococcal Conjugate-13 11/27/2016   Pneumococcal Polysaccharide-23 01/17/2011, 12/17/2014   Tdap 03/09/2018  Objective:     BP 110/70 (BP Location: Right Arm, Cuff Size: Large)   Pulse 72   Temp 97.8 F (36.6 C)   Ht 5\' 4"  (1.626 m)   Wt 251 lb (113.9 kg)   SpO2 95%   BMI 43.08 kg/m   SpO2: 95 % O2 Device: None (Room air)  GENERAL: Morbidly obese woman, presents in transport chair, normally uses walker.  No respiratory distress.  No conversational dyspnea. HEAD: Normocephalic, atraumatic.  EYES: Pupils equal, round, reactive to light.  No scleral icterus.  MOUTH: Dentition shows few chipped teeth.  Oral mucosa moist.  No thrush.  Mallampati class III. NECK: Supple. No thyromegaly. Trachea midline. No JVD.  No adenopathy. PULMONARY: Good air entry bilaterally, decreased breath sounds on the right base (chronic).  Breath sounds coarse with no other adventitious sounds, no wheezing. CARDIOVASCULAR: S1 and S2. Regular rate and rhythm.  No rubs, murmurs or gallops heard. ABDOMEN: Protuberant, obese, otherwise benign. MUSCULOSKELETAL: No joint deformity, no clubbing, lower extremity edema 2+ has compression stockings.  NEUROLOGIC: Mild left sided weakness from prior CVA, no change, gait not tested patient on transport chair SKIN: Intact,warm,dry. PSYCH: Mood and behavior normal.   Assessment & Plan:     ICD-10-CM   1. Moderate persistent asthma without complication  J45.40    Continue Trelegy Ellipta 200, 1 puff a day Continue as needed albuterol    2. Chronically elevated hemidiaphragm - RIGHT  J98.6     Recommend stay active Deep breathing exercises Weight loss    3. OSA (obstructive sleep apnea)  G47.33    Well compensated on CPAP Continue CPAP at 11 cm H2O    4. Morbid obesity with BMI of 40.0-44.9, adult (HCC)  E66.01    Z68.41    Patient would benefit from weight loss     Meds ordered this encounter  Medications   albuterol (VENTOLIN HFA) 108 (90 Base) MCG/ACT inhaler    Sig: Inhale 2 puffs into the lungs every 6 (six) hours as needed.    Dispense:  8 g    Refill:  4   Will see the patient in follow-up in 3 months time she is to contact us prior to that time should any new difficulties arise.   Gailen Shelter, MD Advanced Bronchoscopy PCCM [facility]    *This note was dictated using voice recognition software/Dragon.  Despite best efforts to proofread, errors can occur which can change the meaning. Any transcriptional errors that result from this process are unintentional and may not be fully corrected at the time of dictation.

## 2022-10-21 ENCOUNTER — Other Ambulatory Visit: Payer: Medicare HMO

## 2022-10-21 DIAGNOSIS — I10 Essential (primary) hypertension: Secondary | ICD-10-CM | POA: Diagnosis not present

## 2022-10-21 DIAGNOSIS — E785 Hyperlipidemia, unspecified: Secondary | ICD-10-CM | POA: Diagnosis not present

## 2022-10-21 DIAGNOSIS — L89610 Pressure ulcer of right heel, unstageable: Secondary | ICD-10-CM | POA: Diagnosis not present

## 2022-10-21 DIAGNOSIS — F419 Anxiety disorder, unspecified: Secondary | ICD-10-CM | POA: Diagnosis not present

## 2022-10-21 DIAGNOSIS — J4489 Other specified chronic obstructive pulmonary disease: Secondary | ICD-10-CM | POA: Diagnosis not present

## 2022-10-21 DIAGNOSIS — K219 Gastro-esophageal reflux disease without esophagitis: Secondary | ICD-10-CM | POA: Diagnosis not present

## 2022-10-21 DIAGNOSIS — M109 Gout, unspecified: Secondary | ICD-10-CM | POA: Diagnosis not present

## 2022-10-21 DIAGNOSIS — Z792 Long term (current) use of antibiotics: Secondary | ICD-10-CM | POA: Diagnosis not present

## 2022-10-21 DIAGNOSIS — Z96612 Presence of left artificial shoulder joint: Secondary | ICD-10-CM | POA: Diagnosis not present

## 2022-10-21 DIAGNOSIS — Z7984 Long term (current) use of oral hypoglycemic drugs: Secondary | ICD-10-CM | POA: Diagnosis not present

## 2022-10-21 DIAGNOSIS — Z7901 Long term (current) use of anticoagulants: Secondary | ICD-10-CM | POA: Diagnosis not present

## 2022-10-21 DIAGNOSIS — R41841 Cognitive communication deficit: Secondary | ICD-10-CM | POA: Diagnosis not present

## 2022-10-21 DIAGNOSIS — Z471 Aftercare following joint replacement surgery: Secondary | ICD-10-CM | POA: Diagnosis not present

## 2022-10-21 DIAGNOSIS — K5909 Other constipation: Secondary | ICD-10-CM | POA: Diagnosis not present

## 2022-10-21 DIAGNOSIS — G4733 Obstructive sleep apnea (adult) (pediatric): Secondary | ICD-10-CM | POA: Diagnosis not present

## 2022-10-21 DIAGNOSIS — E538 Deficiency of other specified B group vitamins: Secondary | ICD-10-CM | POA: Diagnosis not present

## 2022-10-21 DIAGNOSIS — I4891 Unspecified atrial fibrillation: Secondary | ICD-10-CM | POA: Diagnosis not present

## 2022-10-21 DIAGNOSIS — Z7951 Long term (current) use of inhaled steroids: Secondary | ICD-10-CM | POA: Diagnosis not present

## 2022-10-21 DIAGNOSIS — E1151 Type 2 diabetes mellitus with diabetic peripheral angiopathy without gangrene: Secondary | ICD-10-CM | POA: Diagnosis not present

## 2022-10-21 DIAGNOSIS — N390 Urinary tract infection, site not specified: Secondary | ICD-10-CM | POA: Diagnosis not present

## 2022-10-21 DIAGNOSIS — F32A Depression, unspecified: Secondary | ICD-10-CM | POA: Diagnosis not present

## 2022-10-21 DIAGNOSIS — L89620 Pressure ulcer of left heel, unstageable: Secondary | ICD-10-CM | POA: Diagnosis not present

## 2022-10-21 DIAGNOSIS — D509 Iron deficiency anemia, unspecified: Secondary | ICD-10-CM | POA: Diagnosis not present

## 2022-10-21 DIAGNOSIS — E039 Hypothyroidism, unspecified: Secondary | ICD-10-CM | POA: Diagnosis not present

## 2022-10-21 DIAGNOSIS — M797 Fibromyalgia: Secondary | ICD-10-CM | POA: Diagnosis not present

## 2022-10-22 ENCOUNTER — Ambulatory Visit: Payer: Medicare HMO | Admitting: Physician Assistant

## 2022-10-27 ENCOUNTER — Ambulatory Visit: Payer: Medicare HMO | Admitting: Physician Assistant

## 2022-10-29 DIAGNOSIS — N390 Urinary tract infection, site not specified: Secondary | ICD-10-CM | POA: Diagnosis not present

## 2022-10-29 DIAGNOSIS — D509 Iron deficiency anemia, unspecified: Secondary | ICD-10-CM | POA: Diagnosis not present

## 2022-10-29 DIAGNOSIS — J4489 Other specified chronic obstructive pulmonary disease: Secondary | ICD-10-CM | POA: Diagnosis not present

## 2022-10-29 DIAGNOSIS — M797 Fibromyalgia: Secondary | ICD-10-CM | POA: Diagnosis not present

## 2022-10-29 DIAGNOSIS — Z471 Aftercare following joint replacement surgery: Secondary | ICD-10-CM | POA: Diagnosis not present

## 2022-10-29 DIAGNOSIS — L89620 Pressure ulcer of left heel, unstageable: Secondary | ICD-10-CM | POA: Diagnosis not present

## 2022-10-29 DIAGNOSIS — I4891 Unspecified atrial fibrillation: Secondary | ICD-10-CM | POA: Diagnosis not present

## 2022-10-29 DIAGNOSIS — I10 Essential (primary) hypertension: Secondary | ICD-10-CM | POA: Diagnosis not present

## 2022-10-29 DIAGNOSIS — F32A Depression, unspecified: Secondary | ICD-10-CM | POA: Diagnosis not present

## 2022-10-29 DIAGNOSIS — F419 Anxiety disorder, unspecified: Secondary | ICD-10-CM | POA: Diagnosis not present

## 2022-10-29 DIAGNOSIS — L89610 Pressure ulcer of right heel, unstageable: Secondary | ICD-10-CM | POA: Diagnosis not present

## 2022-10-29 DIAGNOSIS — R41841 Cognitive communication deficit: Secondary | ICD-10-CM | POA: Diagnosis not present

## 2022-10-29 DIAGNOSIS — E1151 Type 2 diabetes mellitus with diabetic peripheral angiopathy without gangrene: Secondary | ICD-10-CM | POA: Diagnosis not present

## 2022-10-30 ENCOUNTER — Ambulatory Visit: Payer: Medicare HMO | Admitting: Physician Assistant

## 2022-11-02 ENCOUNTER — Telehealth: Payer: Self-pay

## 2022-11-02 ENCOUNTER — Telehealth: Payer: Self-pay | Admitting: Nurse Practitioner

## 2022-11-02 DIAGNOSIS — J4489 Other specified chronic obstructive pulmonary disease: Secondary | ICD-10-CM | POA: Diagnosis not present

## 2022-11-02 DIAGNOSIS — M797 Fibromyalgia: Secondary | ICD-10-CM | POA: Diagnosis not present

## 2022-11-02 DIAGNOSIS — F419 Anxiety disorder, unspecified: Secondary | ICD-10-CM | POA: Diagnosis not present

## 2022-11-02 DIAGNOSIS — Z471 Aftercare following joint replacement surgery: Secondary | ICD-10-CM | POA: Diagnosis not present

## 2022-11-02 DIAGNOSIS — F32A Depression, unspecified: Secondary | ICD-10-CM | POA: Diagnosis not present

## 2022-11-02 DIAGNOSIS — D509 Iron deficiency anemia, unspecified: Secondary | ICD-10-CM | POA: Diagnosis not present

## 2022-11-02 DIAGNOSIS — E1151 Type 2 diabetes mellitus with diabetic peripheral angiopathy without gangrene: Secondary | ICD-10-CM | POA: Diagnosis not present

## 2022-11-02 DIAGNOSIS — R41841 Cognitive communication deficit: Secondary | ICD-10-CM | POA: Diagnosis not present

## 2022-11-02 DIAGNOSIS — I4891 Unspecified atrial fibrillation: Secondary | ICD-10-CM | POA: Diagnosis not present

## 2022-11-02 DIAGNOSIS — L89610 Pressure ulcer of right heel, unstageable: Secondary | ICD-10-CM | POA: Diagnosis not present

## 2022-11-02 DIAGNOSIS — I10 Essential (primary) hypertension: Secondary | ICD-10-CM | POA: Diagnosis not present

## 2022-11-02 DIAGNOSIS — N390 Urinary tract infection, site not specified: Secondary | ICD-10-CM | POA: Diagnosis not present

## 2022-11-02 DIAGNOSIS — L89620 Pressure ulcer of left heel, unstageable: Secondary | ICD-10-CM | POA: Diagnosis not present

## 2022-11-02 NOTE — Telephone Encounter (Signed)
Pt called back to let Konrad Dolores knows she found a box of [place] on [facility] [place].

## 2022-11-02 NOTE — Telephone Encounter (Signed)
Called pt and she stated that [facility] on [address] has the box and will be contacting [facility] for [facility] script so that she can have it filled there.

## 2022-11-02 NOTE — Telephone Encounter (Signed)
[facility] orders for pcp signature has been received and placed in provider to be signed folder

## 2022-11-02 NOTE — Telephone Encounter (Signed)
Pt spouse called in stating that med tirzepatide [facility]) 12.5 MG/0.5ML Pen its not in stock. He stated they have checked a few pharmacy with no luck. He asked if we have any in office? He stated she will keep looking where can they get it from.

## 2022-11-02 NOTE — Telephone Encounter (Signed)
Called pt and informed pt meds are usually on back order and that she can call [facility] pharmacy to see if they have it in stock

## 2022-11-03 ENCOUNTER — Encounter: Payer: Medicare HMO | Attending: Physician Assistant | Admitting: Physician Assistant

## 2022-11-03 DIAGNOSIS — Z7901 Long term (current) use of anticoagulants: Secondary | ICD-10-CM | POA: Diagnosis not present

## 2022-11-03 DIAGNOSIS — I87323 Chronic venous hypertension (idiopathic) with inflammation of bilateral lower extremity: Secondary | ICD-10-CM | POA: Insufficient documentation

## 2022-11-03 DIAGNOSIS — L89616 Pressure-induced deep tissue damage of right heel: Secondary | ICD-10-CM | POA: Diagnosis not present

## 2022-11-03 DIAGNOSIS — I480 Paroxysmal atrial fibrillation: Secondary | ICD-10-CM | POA: Insufficient documentation

## 2022-11-03 DIAGNOSIS — I872 Venous insufficiency (chronic) (peripheral): Secondary | ICD-10-CM | POA: Insufficient documentation

## 2022-11-03 DIAGNOSIS — I10 Essential (primary) hypertension: Secondary | ICD-10-CM | POA: Diagnosis not present

## 2022-11-03 DIAGNOSIS — L89623 Pressure ulcer of left heel, stage 3: Secondary | ICD-10-CM | POA: Diagnosis not present

## 2022-11-03 DIAGNOSIS — Z86718 Personal history of other venous thrombosis and embolism: Secondary | ICD-10-CM | POA: Diagnosis not present

## 2022-11-03 DIAGNOSIS — E1140 Type 2 diabetes mellitus with diabetic neuropathy, unspecified: Secondary | ICD-10-CM | POA: Insufficient documentation

## 2022-11-03 DIAGNOSIS — E11621 Type 2 diabetes mellitus with foot ulcer: Secondary | ICD-10-CM | POA: Diagnosis not present

## 2022-11-03 NOTE — Progress Notes (Signed)
AALINA, SNARSKI ([id number]) [id number] Page 1 of 2 Visit Report for 11/03/2022 Chief Complaint Document Details Patient Name: Date of Service: [name] 11/03/2022 9:00 A M Medical Record Number: [id number] Patient Account Number: [id number] Date of Birth/Sex: Treating RN: [birth year] (72 y.o. [name] Primary Care Provider: [name] Other Clinician: Betha Loa Referring Provider: Treating Provider/Extender: Algis Greenhouse Weeks in Treatment: 4 Information Obtained from: Patient Chief Complaint Bilateral heel ulcers Electronic Signature(s) Signed: 11/03/2022 9:10:32 AM By: Allen Derry PA-C Entered By: Allen Derry on 11/03/2022 09:10:32 -------------------------------------------------------------------------------- Problem List Details Patient Name: Date of Service: [name], [name]. 11/03/2022 9:00 A M Medical Record Number: [id number] Patient Account Number: [id number] Date of Birth/Sex: Treating RN: 1950/12/25 (72 y.o. [name] Primary Care Provider: [name] Other Clinician: Betha Loa Referring Provider: Treating Provider/Extender: Algis Greenhouse Weeks in Treatment: 4 Active Problems ICD-10 Encounter Code Description Active Date MDM Diagnosis E11.621 Type 2 diabetes mellitus with foot ulcer 10/01/2022 No Yes I87.323 Chronic venous hypertension (idiopathic) with inflammation of 10/01/2022 No Yes bilateral lower extremity L89.623 Pressure ulcer of left heel, stage 3 10/01/2022 No Yes [name], [name] ([id number]) [id number] Page 2 of 2 L89.616 Pressure-induced deep tissue damage of right heel 10/01/2022 No Yes Z79.01 Long term (current) use of anticoagulants 10/01/2022 No Yes I48.0 Paroxysmal atrial fibrillation 10/01/2022 No Yes Inactive Problems Resolved Problems Electronic Signature(s) Signed: 11/03/2022 9:10:28 AM By: Allen Derry PA-C Entered By: Allen Derry  on 11/03/2022 09:10:28

## 2022-11-03 NOTE — Telephone Encounter (Signed)
Forms have beenf axed back to [facility] at [phone] with a completed transmission log

## 2022-11-05 NOTE — Progress Notes (Signed)
SHARRON, FLORY [name] ([id number]) [id number] Page 1 of 9 Visit Report for 11/03/2022 Arrival Information Details Patient Name: Date of Service: [name], [place] 11/03/2022 9:00 A M Medical Record Number: [id number] Patient Account Number: [id number] Date of Birth/Sex: Treating RN: 1950-07-25 (72 y.o. [name] Primary Care : [name] Other Clinician: Betha Loa Referring : Treating /Extender: Amedeo Kinsman in Treatment: 4 Visit Information History Since Last Visit All ordered tests and consults were completed: No Patient Arrived: Wheel Chair Added or deleted any medications: No Arrival Time: 09:07 Any new allergies or adverse reactions: No Transfer Assistance: EasyPivot Patient Lift Had a fall or experienced change in No Patient Identification Verified: Yes activities of daily living that may affect Secondary Verification Process Completed: Yes risk of falls: Patient Requires Transmission-Based Precautions: No Signs or symptoms of abuse/neglect since last visito No Patient Has Alerts: Yes Hospitalized since last visit: No Patient Alerts: Patient on Blood Thinner Implantable device outside of the clinic excluding No ABI L 1.1 02/05/22 cellular tissue based products placed in the center ABI R 1.12 02/05/22 since last visit: Has Dressing in Place as Prescribed: Yes Pain Present Now: Yes Electronic Signature(s) Signed: 11/05/2022 8:42:03 AM By: Betha Loa Entered By: Betha Loa on 11/03/2022 09:09:29 -------------------------------------------------------------------------------- Clinic Level of Care Assessment Details Patient Name: Date of Service: [name] 11/03/2022 9:00 A M Medical Record Number: [id number] Patient Account Number: [id number] Date of Birth/Sex: Treating RN: 02-23-1951 (72 y.o. [name] Primary Care : [name] Other Clinician: Betha Loa Referring : Treating /Extender: Algis Greenhouse Weeks in Treatment: 4 Clinic Level of Care Assessment Items TOOL 1 Quantity Score []  - 0 Use when EandM and Procedure is performed on INITIAL visit ASSESSMENTS - Nursing Assessment / Reassessment []  - 0 General Physical Exam (combine w/ comprehensive assessment (listed just below) when performed on new pt. evals) []  - 0 Comprehensive Assessment (HX, ROS, Risk Assessments, Wounds Hx, etc.) ELSEE, BERGHORST [name] ([id number]) [phone].pdf Page 2 of 9 ASSESSMENTS - Wound and Skin Assessment / Reassessment []  - 0 Dermatologic / Skin Assessment (not related to wound area) ASSESSMENTS - Ostomy and/or Continence Assessment and Care []  - 0 Incontinence Assessment and Management []  - 0 Ostomy Care Assessment and Management (repouching, etc.) PROCESS - Coordination of Care []  - 0 Simple Patient / Family Education for ongoing care []  - 0 Complex (extensive) Patient / Family Education for ongoing care []  - 0 Staff obtains Chiropractor, Records, T Results / Process Orders est []  - 0 Staff telephones HHA, Nursing Homes / Clarify orders / etc []  - 0 Routine Transfer to another Facility (non-emergent condition) []  - 0 Routine Hospital Admission (non-emergent condition) []  - 0 New Admissions / Manufacturing engineer / Ordering NPWT Apligraf, etc. , []  - 0 Emergency Hospital Admission (emergent condition) PROCESS - Special Needs []  - 0 Pediatric / Minor Patient Management []  - 0 Isolation Patient Management []  - 0 Hearing / Language / Visual special needs []  - 0 Assessment of Community assistance (transportation, D/C planning, etc.) []  - 0 Additional assistance / Altered mentation []  - 0 Support Surface(s) Assessment (bed, cushion, seat, etc.) INTERVENTIONS - Miscellaneous []  - 0 External ear exam []  - 0 Patient Transfer (multiple staff / Nurse, adult / Similar devices) []  -  0 Simple Staple / Suture removal (25 or less) []  - 0 Complex Staple / Suture removal (26 or more) []  - 0 Hypo/Hyperglycemic Management (do not check if  billed separately) []  - 0 Ankle / Brachial Index (ABI) - do not check if billed separately Has the patient been seen at the hospital within the last three years: Yes Total Score: 0 Level Of Care: ____ Electronic Signature(s) Signed: 11/05/2022 8:42:03 AM By: Betha Loa Entered By: Betha Loa on 11/03/2022 09:34:54 -------------------------------------------------------------------------------- Encounter Discharge Information Details Patient Name: Date of Service: [name], Dawn Cruz [name]. 11/03/2022 9:00 A M Medical Record Number: [id number] Patient Account Number: [id number] Date of Birth/Sex: Treating RN: 1950/05/26 (72 y.o. [name] Primary Care : [name] Other Clinician: Betha Loa Referring : Treating /Extender: Algis Greenhouse [place], Gala Romney ([id number]) [id number] Page 3 of 9 Weeks in Treatment: 4 Encounter Discharge Information Items Post Procedure Vitals Discharge Condition: Stable Temperature (F): 97.8 Ambulatory Status: Wheelchair Pulse (bpm): 76 Discharge Destination: Home Respiratory Rate (breaths/min): 18 Transportation: Private Auto Blood Pressure (mmHg): 120/78 Accompanied By: husband Schedule Follow-up Appointment: Yes Clinical Summary of Care: Electronic Signature(s) Signed: 11/05/2022 8:42:03 AM By: Betha Loa Entered By: Betha Loa on 11/03/2022 09:36:27 -------------------------------------------------------------------------------- Lower Extremity Assessment Details Patient Name: Date of Service: Dawn Likens [name]. 11/03/2022 9:00 A M Medical Record Number: [id number] Patient Account Number: [id number] Date of Birth/Sex: Treating RN: 1950/06/02 (72 y.o. [name] Primary Care : [name] Other  Clinician: Betha Loa Referring : Treating /Extender: Algis Greenhouse Weeks in Treatment: 4 Edema Assessment Assessed: [Left: Yes] [Right: No] Edema: [Left: Ye] [Right: s] Calf Left: Right: Point of Measurement: 33 cm From Medial Instep 32.5 cm Ankle Left: Right: Point of Measurement: 7 cm From Medial Instep 17.4 cm Vascular Assessment Pulses: Dorsalis Pedis Palpable: [Left:Yes] Toe Nail Assessment Left: Right: Thick: Yes Discolored: No Deformed: No Improper Length and Hygiene: No Electronic Signature(s) Signed: 11/03/2022 4:57:03 PM By: Midge Aver MSN RN CNS WTA Signed: 11/05/2022 8:42:03 AM By: Betha Loa Entered By: Betha Loa on 11/03/2022 09:20:22 [name] ([id number]) [id number] Page 4 of 9 -------------------------------------------------------------------------------- Multi Wound Chart Details Patient Name: Date of Service: [name] 11/03/2022 9:00 A M Medical Record Number: [id number] Patient Account Number: [id number] Date of Birth/Sex: Treating RN: [birth year] (72 y.o. [name] Primary Care : [name] Other Clinician: Betha Loa Referring : Treating /Extender: Algis Greenhouse Weeks in Treatment: 4 Vital Signs Height(in): 64 Pulse(bpm): 76 Weight(lbs): 250 Blood Pressure(mmHg): 120/78 Body Mass Index(BMI): 42.9 Temperature(F): 97.8 Respiratory Rate(breaths/min): 18 [24:Photos:] [N/A:N/A] Left Calcaneus N/A N/A Wound Location: Gradually Appeared N/A N/A Wounding Event: Pressure Ulcer N/A N/A Primary Etiology: Asthma, Pneumothorax, Arrhythmia, N/A N/A Comorbid History: Hypertension, Type II Diabetes, Gout, Osteoarthritis, Neuropathy 08/22/2022 N/A N/A Date Acquired: 4 N/A N/A Weeks of Treatment: Open N/A N/A Wound Status: No N/A N/A Wound Recurrence: 1x1.3x0.1 N/A N/A Measurements L x W x D (cm) 1.021 N/A N/A A (cm)  : rea 0.102 N/A N/A Volume (cm) : 13.30% N/A N/A % Reduction in A rea: 13.60% N/A N/A % Reduction in Volume: Category/Stage III N/A N/A Classification: Small N/A N/A Exudate A mount: Serous N/A N/A Exudate Type: amber N/A N/A Exudate Color: Distinct, outline attached N/A N/A Wound Margin: Small (1-33%) N/A N/A Granulation A mount: Red N/A N/A Granulation Quality: Medium (34-66%) N/A N/A Necrotic A mount: Fat Layer (Subcutaneous Tissue): Yes N/A N/A Exposed Structures: Fascia: No Tendon: No Muscle: No Joint: No Bone: No None N/A N/A Epithelialization: Treatment Notes Electronic Signature(s) Signed: 11/05/2022 8:42:03 AM By: Betha Loa Entered By: Betha Loa on 11/03/2022 09:20:30  [name], [name] ([id number]) [id number] Page 5 of 9 -------------------------------------------------------------------------------- Multi-Disciplinary Care Plan Details Patient Name: Date of Service: [name] 11/03/2022 9:00 A M Medical Record Number: [id number] Patient Account Number: [id number] Date of Birth/Sex: Treating RN: 08/03/1950 (72 y.o. [name] Primary Care : [name] Other Clinician: Betha Loa Referring : Treating /Extender: Algis Greenhouse Weeks in Treatment: 4 Active Inactive Necrotic Tissue Nursing Diagnoses: Knowledge deficit related to management of necrotic/devitalized tissue Goals: Necrotic/devitalized tissue will be minimized in the wound bed Date Initiated: 10/01/2022 Target Resolution Date: 11/01/2022 Goal Status: Active Interventions: Assess patient pain level pre-, during and post procedure and prior to discharge Treatment Activities: Apply topical anesthetic as ordered : 10/01/2022 Notes: Wound/Skin Impairment Nursing Diagnoses: Knowledge deficit related to ulceration/compromised skin integrity Goals: Patient/caregiver will verbalize understanding of skin care  regimen Date Initiated: 10/01/2022 Target Resolution Date: 11/01/2022 Goal Status: Active Ulcer/skin breakdown will have a volume reduction of 30% by week 4 Date Initiated: 10/01/2022 Target Resolution Date: 11/01/2022 Goal Status: Active Ulcer/skin breakdown will have a volume reduction of 50% by week 8 Date Initiated: 10/01/2022 Target Resolution Date: 12/02/2022 Goal Status: Active Ulcer/skin breakdown will have a volume reduction of 80% by week 12 Date Initiated: 10/01/2022 Target Resolution Date: 01/01/2023 Goal Status: Active Ulcer/skin breakdown will heal within 14 weeks Date Initiated: 10/01/2022 Target Resolution Date: 02/01/2023 Goal Status: Active Interventions: Assess patient/caregiver ability to obtain necessary supplies Assess patient/caregiver ability to perform ulcer/skin care regimen upon admission and as needed Assess ulceration(s) every visit Notes: Electronic Signature(s) Signed: 11/03/2022 4:57:03 PM By: Midge Aver MSN RN CNS WTA Signed: 11/05/2022 8:42:03 AM By: Bronson Ing ([id number]) [id number] Page 6 of 9 Entered By: Betha Loa on 11/03/2022 09:35:04 -------------------------------------------------------------------------------- Pain Assessment Details Patient Name: Date of Service: [name] 11/03/2022 9:00 A M Medical Record Number: [id number] Patient Account Number: [id number] Date of Birth/Sex: Treating RN: 09-Oct-1950 (72 y.o. [name] Primary Care : [name] Other Clinician: Betha Loa Referring : Treating /Extender: Algis Greenhouse Weeks in Treatment: 4 Active Problems Location of Pain Severity and Description of Pain Patient Has Paino Yes Site Locations Pain Location: Pain in Ulcers Duration of the Pain. Constant / Intermittento Constant Rate the pain. Current Pain Level: 5 Character of Pain Describe the Pain: Aching, Throbbing Pain  Management and Medication Current Pain Management: Medication: Yes Cold Application: No Rest: No Massage: No Activity: No T.E.N.S.: No Heat Application: No Leg drop or elevation: No Is the Current Pain Management Adequate: Inadequate How does your wound impact your activities of daily livingo Sleep: No Bathing: No Appetite: No Relationship With Others: No Bladder Continence: No Emotions: No Bowel Continence: No Work: No Toileting: No Drive: No Dressing: No Hobbies: No Electronic Signature(s) Signed: 11/03/2022 4:57:03 PM By: Midge Aver MSN RN CNS WTA Signed: 11/05/2022 8:42:03 AM By: Betha Loa Entered By: Betha Loa on 11/03/2022 09:12:40 [name] ([id number]) [id number] Page 7 of 9 -------------------------------------------------------------------------------- Patient/Caregiver Education Details Patient Name: Date of Service: [name] 8/13/2024andnbsp9:00 A M Medical Record Number: [id number] Patient Account Number: [id number] Date of Birth/Gender: Treating RN: November 01, 1950 (72 y.o. [name] Primary Care Physician: [name] Other Clinician: Betha Loa Referring Physician: Treating Physician/Extender: Amedeo Kinsman in Treatment: 4 Education Assessment Education Provided To: Patient Education Topics Provided Wound/Skin Impairment: Handouts: Other: continue wound care as directed Methods: Explain/Verbal Responses: State content correctly Electronic Signature(s) Signed: 11/05/2022 8:42:03  AM By: Betha Loa Entered By: Betha Loa on 11/03/2022 09:35:26 -------------------------------------------------------------------------------- Wound Assessment Details Patient Name: Date of Service: [name] 11/03/2022 9:00 A M Medical Record Number: [id number] Patient Account Number: [id number] Date of Birth/Sex: Treating RN: Jun 04, 1950 (72 y.o. [name] Primary Care  : [name] Other Clinician: Betha Loa Referring : Treating /Extender: Algis Greenhouse Weeks in Treatment: 4 Wound Status Wound Number: 24 Primary Pressure Ulcer Etiology: Wound Location: Left Calcaneus Wound Open Wounding Event: Gradually Appeared Status: Date Acquired: 08/22/2022 Comorbid Asthma, Pneumothorax, Arrhythmia, Hypertension, Type II Weeks Of Treatment: 4 History: Diabetes, Gout, Osteoarthritis, Neuropathy Clustered Wound: No Photos [facility], GIRDLER [name] ([id number]) [id number] Page 8 of 9 Wound Measurements Length: (cm) 1 Width: (cm) 1.3 Depth: (cm) 0.1 Area: (cm) 1.021 Volume: (cm) 0.102 % Reduction in Area: 13.3% % Reduction in Volume: 13.6% Epithelialization: None Wound Description Classification: Category/Stage III Wound Margin: Distinct, outline attached Exudate Amount: Small Exudate Type: Serous Exudate Color: amber Foul Odor After Cleansing: No Slough/Fibrino Yes Wound Bed Granulation Amount: Small (1-33%) Exposed Structure Granulation Quality: Red Fascia Exposed: No Necrotic Amount: Medium (34-66%) Fat Layer (Subcutaneous Tissue) Exposed: Yes Necrotic Quality: Adherent Slough, Bone Tendon Exposed: No Muscle Exposed: No Joint Exposed: No Bone Exposed: No Treatment Notes Wound #24 (Calcaneus) Wound Laterality: Left Cleanser Normal Saline Discharge Instruction: Wash your hands with soap and water. Remove old dressing, discard into plastic bag and place into trash. Cleanse the wound with Normal Saline prior to applying a clean dressing using gauze sponges, not tissues or cotton balls. Do not scrub or use excessive force. Pat dry using gauze sponges, not tissue or cotton balls. Soap and Water Discharge Instruction: Gently cleanse wound with antibacterial soap, rinse and pat dry prior to dressing wounds Peri-Wound Care Topical Primary Dressing Promogran Matrix 4.34 (in) Discharge  Instruction: Moisten w/normal saline or sterile water; Cover wound as directed. Do not remove from wound bed. Secondary Dressing (BORDER) Zetuvit Plus SILICONE BORDER Dressing 4x4 (in/in) Discharge Instruction: Please do not put silicone bordered dressings under wraps. Use non-bordered dressing only. Secured With Compression Wrap Compression Stockings Facilities manager) Signed: 11/03/2022 4:57:03 PM By: Midge Aver MSN RN CNS WTA Signed: 11/05/2022 8:42:03 AM By: Betha Loa Entered By: Betha Loa on 11/03/2022 09:18:40 [name] ([id number]) [id number] Page 9 of 9 -------------------------------------------------------------------------------- Vitals Details Patient Name: Date of Service: [name] 11/03/2022 9:00 A M Medical Record Number: [id number] Patient Account Number: [id number] Date of Birth/Sex: Treating RN: 07-22-1950 (72 y.o. [name] Primary Care : [name] Other Clinician: Betha Loa Referring : Treating /Extender: Algis Greenhouse Weeks in Treatment: 4 Vital Signs Time Taken: 09:10 Temperature (F): 97.8 Height (in): 64 Pulse (bpm): 76 Weight (lbs): 250 Respiratory Rate (breaths/min): 18 Body Mass Index (BMI): 42.9 Blood Pressure (mmHg): 120/78 Reference Range: 80 - 120 mg / dl Electronic Signature(s) Signed: 11/05/2022 8:42:03 AM By: Betha Loa Entered By: Betha Loa on 11/03/2022 09:12:31

## 2022-11-06 DIAGNOSIS — N390 Urinary tract infection, site not specified: Secondary | ICD-10-CM | POA: Diagnosis not present

## 2022-11-06 DIAGNOSIS — L89620 Pressure ulcer of left heel, unstageable: Secondary | ICD-10-CM | POA: Diagnosis not present

## 2022-11-06 DIAGNOSIS — I4891 Unspecified atrial fibrillation: Secondary | ICD-10-CM | POA: Diagnosis not present

## 2022-11-06 DIAGNOSIS — F419 Anxiety disorder, unspecified: Secondary | ICD-10-CM | POA: Diagnosis not present

## 2022-11-06 DIAGNOSIS — I10 Essential (primary) hypertension: Secondary | ICD-10-CM | POA: Diagnosis not present

## 2022-11-06 DIAGNOSIS — J4489 Other specified chronic obstructive pulmonary disease: Secondary | ICD-10-CM | POA: Diagnosis not present

## 2022-11-06 DIAGNOSIS — M797 Fibromyalgia: Secondary | ICD-10-CM | POA: Diagnosis not present

## 2022-11-06 DIAGNOSIS — R41841 Cognitive communication deficit: Secondary | ICD-10-CM | POA: Diagnosis not present

## 2022-11-06 DIAGNOSIS — L89610 Pressure ulcer of right heel, unstageable: Secondary | ICD-10-CM | POA: Diagnosis not present

## 2022-11-06 DIAGNOSIS — F32A Depression, unspecified: Secondary | ICD-10-CM | POA: Diagnosis not present

## 2022-11-06 DIAGNOSIS — Z471 Aftercare following joint replacement surgery: Secondary | ICD-10-CM | POA: Diagnosis not present

## 2022-11-06 DIAGNOSIS — E1151 Type 2 diabetes mellitus with diabetic peripheral angiopathy without gangrene: Secondary | ICD-10-CM | POA: Diagnosis not present

## 2022-11-06 DIAGNOSIS — D509 Iron deficiency anemia, unspecified: Secondary | ICD-10-CM | POA: Diagnosis not present

## 2022-11-08 IMAGING — MG MM DIGITAL SCREENING BILAT W/ TOMO AND CAD
8 of 15 series · 8 of 40 positions shown · non-contrast
Comparison: Previous exam(s).

CLINICAL DATA: Screening.

EXAM:
DIGITAL SCREENING BILATERAL MAMMOGRAM WITH TOMOSYNTHESIS AND CAD
TECHNIQUE: Bilateral screening digital craniocaudal and mediolateral oblique
mammograms were obtained. Bilateral screening digital breast
tomosynthesis was performed. The images were evaluated with
computer-aided detection.

[R CC synth-2D (1 of 2)]
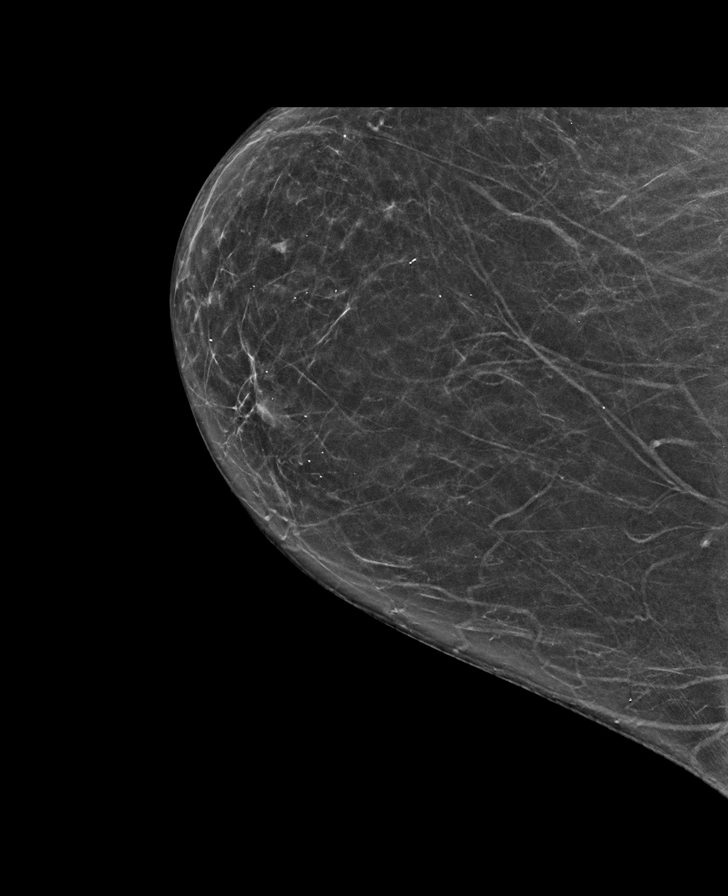

[L CC synth-2D (1 of 2)]
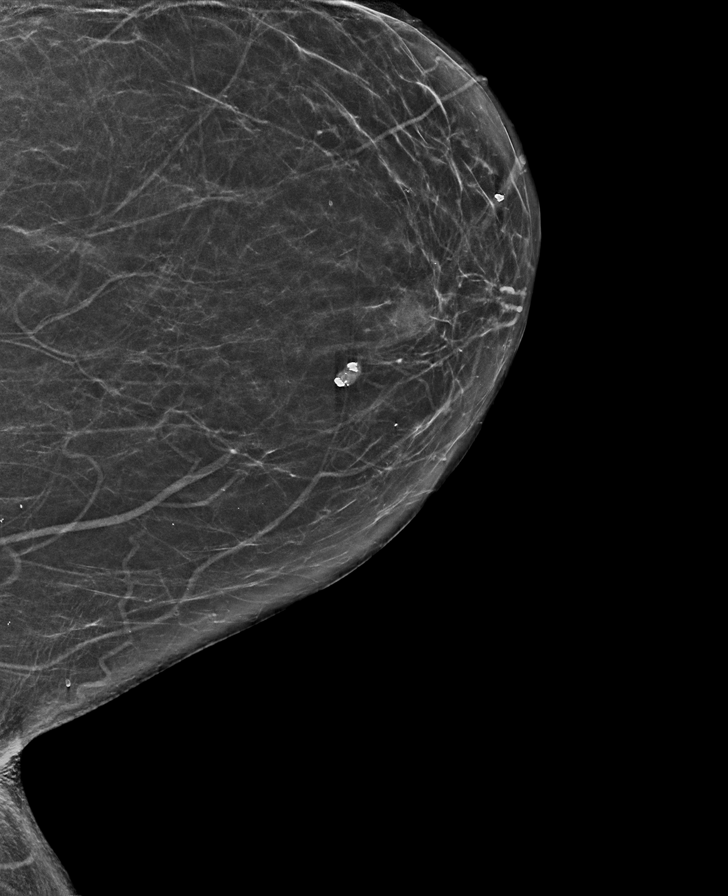

[L MLO synth-2D (1 of 2)]
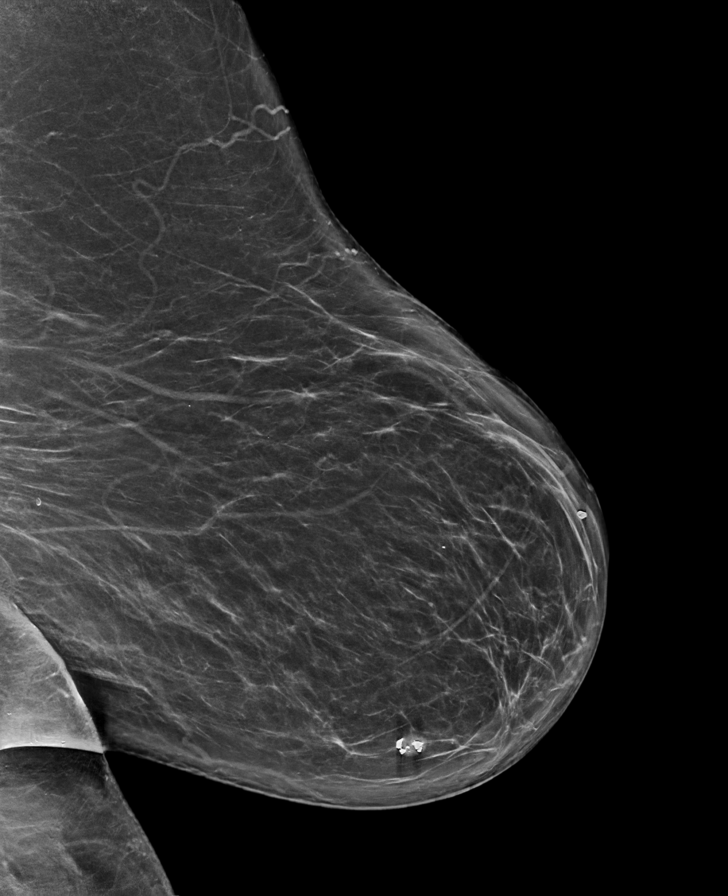

[R CC synth-2D (2 of 2)]
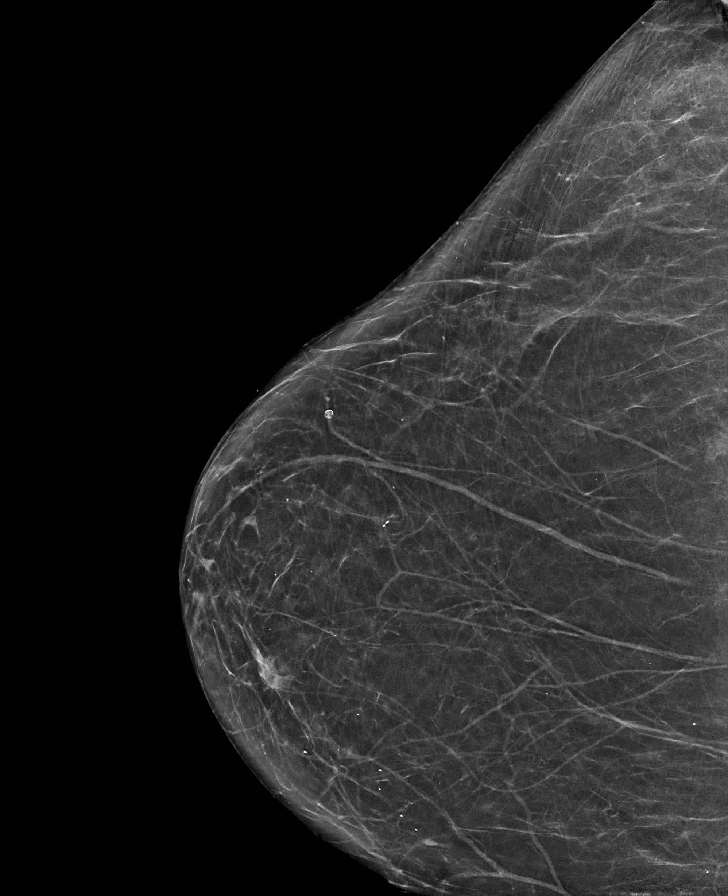

[R MLO synth-2D]
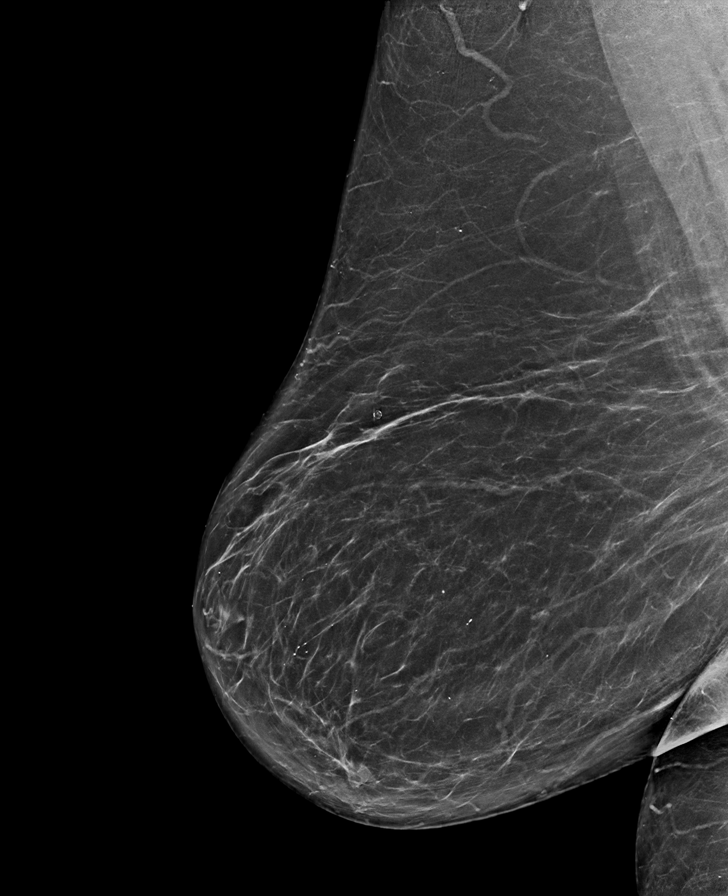

[L MLO synth-2D (2 of 2)]
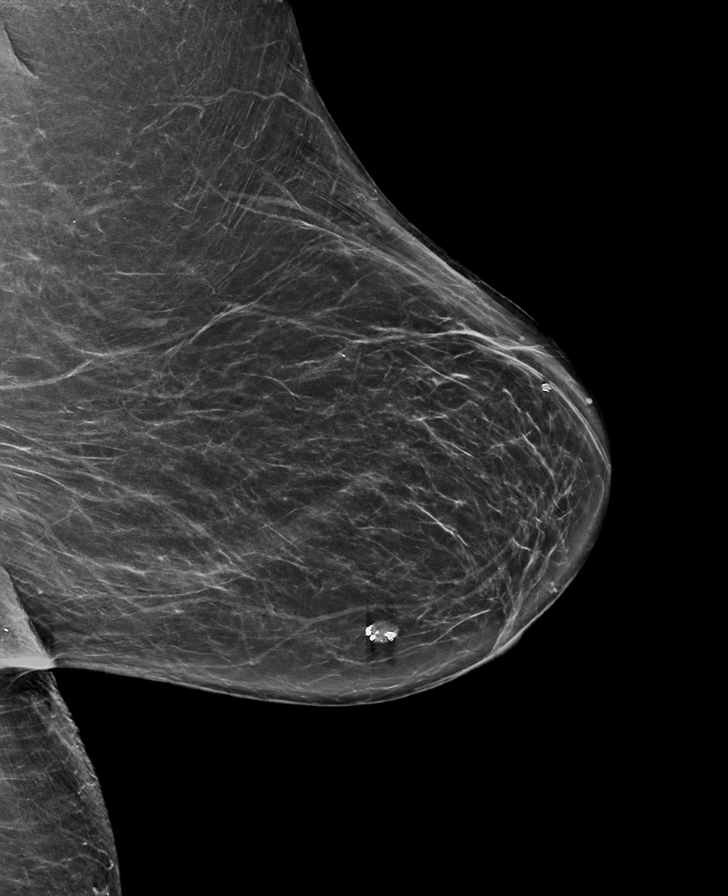

[L CC synth-2D (2 of 2)]
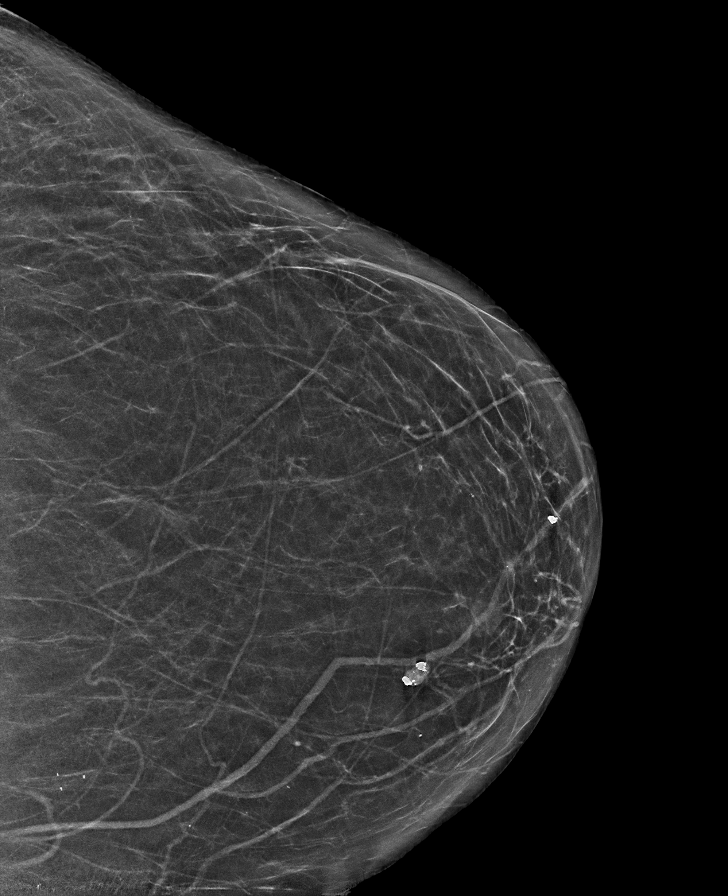

[L CC tomo · tomo slice 44/64.0]
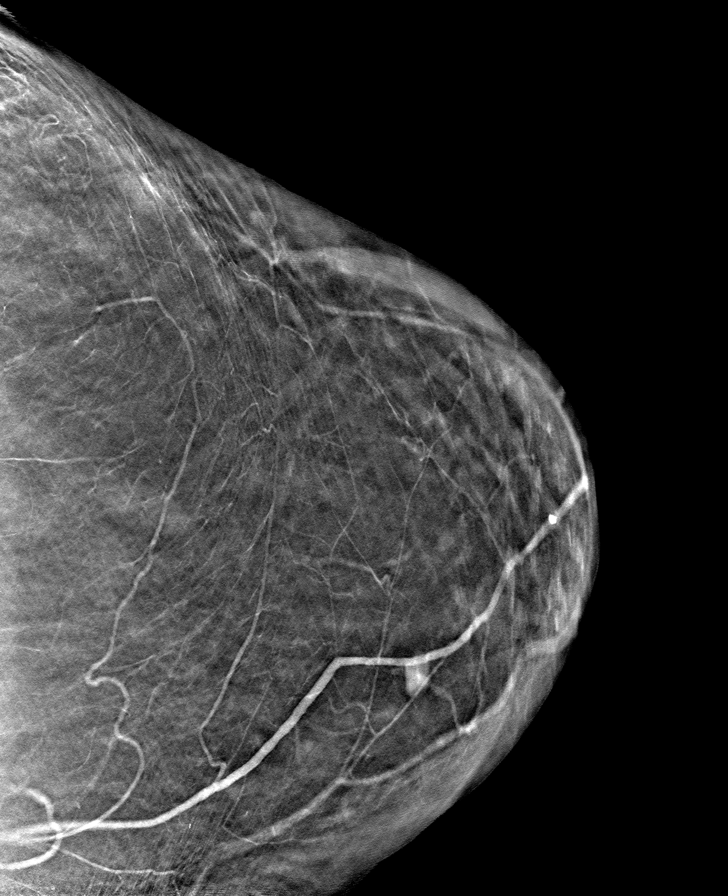

[8 of 40 positions shown; findings below may reference images not displayed]

ACR Breast Density Category b: There are scattered areas of
fibroglandular density.
FINDINGS: There are no findings suspicious for malignancy. The images were
evaluated with computer-aided detection.
IMPRESSION: No mammographic evidence of malignancy. A result letter of this
screening mammogram will be mailed directly to the patient.

RECOMMENDATION:
Screening mammogram in one year. (Code:WJ-I-BG6)

BI-RADS CATEGORY  1: Negative.

## 2022-11-10 ENCOUNTER — Encounter: Payer: Medicare HMO | Admitting: Physician Assistant

## 2022-11-10 DIAGNOSIS — Z86718 Personal history of other venous thrombosis and embolism: Secondary | ICD-10-CM | POA: Diagnosis not present

## 2022-11-10 DIAGNOSIS — E1151 Type 2 diabetes mellitus with diabetic peripheral angiopathy without gangrene: Secondary | ICD-10-CM | POA: Diagnosis not present

## 2022-11-10 DIAGNOSIS — D509 Iron deficiency anemia, unspecified: Secondary | ICD-10-CM | POA: Diagnosis not present

## 2022-11-10 DIAGNOSIS — I87323 Chronic venous hypertension (idiopathic) with inflammation of bilateral lower extremity: Secondary | ICD-10-CM | POA: Diagnosis not present

## 2022-11-10 DIAGNOSIS — J4489 Other specified chronic obstructive pulmonary disease: Secondary | ICD-10-CM | POA: Diagnosis not present

## 2022-11-10 DIAGNOSIS — I872 Venous insufficiency (chronic) (peripheral): Secondary | ICD-10-CM | POA: Diagnosis not present

## 2022-11-10 DIAGNOSIS — Z471 Aftercare following joint replacement surgery: Secondary | ICD-10-CM | POA: Diagnosis not present

## 2022-11-10 DIAGNOSIS — L89610 Pressure ulcer of right heel, unstageable: Secondary | ICD-10-CM | POA: Diagnosis not present

## 2022-11-10 DIAGNOSIS — R41841 Cognitive communication deficit: Secondary | ICD-10-CM | POA: Diagnosis not present

## 2022-11-10 DIAGNOSIS — F32A Depression, unspecified: Secondary | ICD-10-CM | POA: Diagnosis not present

## 2022-11-10 DIAGNOSIS — L89620 Pressure ulcer of left heel, unstageable: Secondary | ICD-10-CM | POA: Diagnosis not present

## 2022-11-10 DIAGNOSIS — E11621 Type 2 diabetes mellitus with foot ulcer: Secondary | ICD-10-CM | POA: Diagnosis not present

## 2022-11-10 DIAGNOSIS — N390 Urinary tract infection, site not specified: Secondary | ICD-10-CM | POA: Diagnosis not present

## 2022-11-10 DIAGNOSIS — I480 Paroxysmal atrial fibrillation: Secondary | ICD-10-CM | POA: Diagnosis not present

## 2022-11-10 DIAGNOSIS — M797 Fibromyalgia: Secondary | ICD-10-CM | POA: Diagnosis not present

## 2022-11-10 DIAGNOSIS — L89616 Pressure-induced deep tissue damage of right heel: Secondary | ICD-10-CM | POA: Diagnosis not present

## 2022-11-10 DIAGNOSIS — E1140 Type 2 diabetes mellitus with diabetic neuropathy, unspecified: Secondary | ICD-10-CM | POA: Diagnosis not present

## 2022-11-10 DIAGNOSIS — I4891 Unspecified atrial fibrillation: Secondary | ICD-10-CM | POA: Diagnosis not present

## 2022-11-10 DIAGNOSIS — I10 Essential (primary) hypertension: Secondary | ICD-10-CM | POA: Diagnosis not present

## 2022-11-10 DIAGNOSIS — L89623 Pressure ulcer of left heel, stage 3: Secondary | ICD-10-CM | POA: Diagnosis not present

## 2022-11-10 DIAGNOSIS — F419 Anxiety disorder, unspecified: Secondary | ICD-10-CM | POA: Diagnosis not present

## 2022-11-10 DIAGNOSIS — Z7901 Long term (current) use of anticoagulants: Secondary | ICD-10-CM | POA: Diagnosis not present

## 2022-11-10 NOTE — Progress Notes (Signed)
DEMII, LEDER ([id number]) [id number] Page 1 of 9 Visit Report for 11/10/2022 Chief Complaint Document Details Patient Name: Date of Service: [name] 11/10/2022 12:45 PM Medical Record Number: [id number] Patient Account Number: [id number] Date of Birth/Sex: Treating RN: 02/10/1951 (72 y.o. [name] Primary Care Provider: [name] Other Clinician: Betha Loa Referring Provider: Treating Provider/Extender: Algis Greenhouse Weeks in Treatment: 5 Information Obtained from: Patient Chief Complaint Bilateral heel ulcers Electronic Signature(s) Signed: 11/10/2022 1:07:26 PM By: Allen Derry PA-C Entered By: Allen Derry on 11/10/2022 10:07:26 -------------------------------------------------------------------------------- HPI Details Patient Name: Date of Service: [name] 11/10/2022 12:45 PM Medical Record Number: [id number] Patient Account Number: [id number] Date of Birth/Sex: Treating RN: May 20, 1950 (72 y.o. [name] Primary Care Provider: [name] Other Clinician: Betha Loa Referring Provider: Treating Provider/Extender: Algis Greenhouse Weeks in Treatment: 5 History of Present Illness HPI Description: 06/28/20 upon evaluation today patient appears to be doing unfortunately somewhat poorly in regard to wounds that she has over her lower extremities. She has a wound on the left distal second toe, left posterior heel, and right anterior lower leg. With that being said all in all none of the wounds appear to be too bad the one that hurts her the most is actually the wound on the posterior heel which actually is also one of the smallest. Nonetheless I think there is some callus hiding what is really going on in this area. Fortunately there does not appear to be any obvious evidence of infection which is great news. Patient does have a history of diabetes mellitus type 2, chronic venous  insufficiency, hypertension, history of DVT and is on long-term anticoagulant s, therapy, Eliquis. 07/05/20 upon evaluation today patient appears to be doing well currently with regard to her wounds. I feel like she is making progress which is great news and overall there is no signs of active infection at this time. No fevers, chills, nausea, vomiting, or diarrhea. 4/29; patient has a only a small area remaining on the tip of her left second toe and a small area remaining on the left heel. The area on the right anterior lower leg is healed. They tell me that she had remote podiatric surgery on the toes of the left foot however they are very fixed in position and I wonder whether they are making it difficult to relieve pressure in her foot wear. She is a type II diabetic with neuropathy as well. 08/01/2020 upon evaluation today patient appears to be doing well currently in regard to her wounds. In fact everything appears to be doing much better. The posterior aspect of her heel is actually giving her some trouble not the Achilles area but a small wound that we did not even have marked. In fact I had noted this before. Nonetheless we are going to addressed that today. 5/27; most of the patient's wounds are healed including the left Achilles heel left toe. Right forearm is also healed. She has a new abrasion posteriorly in the LACHE, SAMAN B ([id number]) [id number] Page 2 of 9 right elbow area just above the olecranon this happened today with an abrasion from her car door 6/15; the patient's wounds on the left Achilles and left second toe remain healed. The area on the right forearm is also just about healed in fact the skin I replaced last time is looking viable which is good. She had me look at the left heel again which is not in  the area of the wound more towards the tip of the heel at the Achilles insertion site as well as the tip of the left foot second toe. In the  Achilles area that is clearly is friction probably in bed at night. Not really near where the wound was. The left second toe remains healed although this is hammered and overhanging first toe also puts pressure in this area 09/13/2020 upon evaluation today patient appears to be doing well with regard to all previous wounds that she had. Everything is completely healed. With that being said I do think that the patient unfortunately has a new skin tear on the right forearm which is due to her put a Band-Aid on it and then when it pulled off this caused some issues with skin tearing. Nonetheless I think that this needs to be addressed today. Hopefully will take too long to get this to heal. She is having some issues with pain in her heel but I think this is more related to be honest to her neuropathy as opposed to anything else. Readmission: 10/30/2020 upon evaluation today patient appears to be doing poorly in regard to her legs bilaterally. She is also having an issue with her fourth toe on the left. Fortunately there does not appear to be any signs of active infection at this time. No fevers, chills, nausea, vomiting, or diarrhea. It is actually been quite a while since we have seen her. In fact June 24 was the last time that she was here in the clinic. She tells me she did not come back because things were just doing "pretty well". 11/29/2020 upon evaluation today patient actually appears to be doing well with regard to her arms those are completely healed. Her right leg is still open but doing okay. The fourth toe of the left foot does show some signs here of having some issues with necrotic tissue of an open wound. This is where a toenail was removed by podiatry. Subsequently I do think that we need to go ahead and see what we can do about getting this cleared away so being try to get some healing going forward. 12/13/2020 upon evaluation today patient appears to be doing decently well in regard to her  wound on the right leg. In general I think that she is making good progress here. Unfortunately she continues to have significant issues with swelling in the left leg is now even more swollen at this point. For that reason I do believe she would be benefited by wrapping both sides although she is not interested in doing this. Overall I think that she is definitely making some good progress here nonetheless in regard to the right leg left leg leave some to be desired. 12/27/2020 upon evaluation today patient appears to be doing well with regard to her wound in regard to the legs. Unfortunately the toe was not doing nearly as well. I am much more concerned about infection and osteomyelitis here. She had the MRI but right now were not seeing obvious evidence of redness spreading up to her foot this is good news I do not have the result of the MRI as of yet. I am good to place her on doxycycline however due to the fact that the wound does seem to be getting a little worse in regard to her toe. 01/14/2021 upon evaluation today patient appears to be doing well with regard to her ulcers. The right leg is doing well and even the toe seems to  be doing well though she still has some evidence of bone exposed on the toe which does not appear to be doing quite as good as I would like to see just and the fact that is still not covered over new tissue but looks tremendously better compared to 2 weeks ago. Overall very pleased in that regard. 01/28/2021 upon evaluation today patient's leg on the left actually appears to have new wounds this is due to her deciding to shave yesterday. Fortunately there does not appear to be any signs of infection currently. This is good news. Nonetheless I do believe that with these new areas open it would be best to wrap her leg to try to make sure we get this under control sooner rather than later. With regard to her toe there still does appear to be some evidence of bone exposed this  was covered over with callus I did remove that today. Nonetheless I think this is significantly smaller than previous. 02/11/2021 upon evaluation today patient appears to be doing okay in regard to her leg ulcers. I do not see anything obvious at this point this seems to be a complicating factor. I think that she is getting better. Were wrap of the right leg not the left leg. With that being said the left fourth toe ulcer still does have evidence of being open there is some drainage still on top of the wound I think we can to try to see about using something like a collagen dressing today to see if that would be of benefit for her. She is in agreement with giving this a trial. Nonetheless I am concerned about the fact that she continues to have issues here with this not healing it is confirmed chronic osteomyelitis she has been on antibiotics. I am going to extend those today as well. That is Keflex and that will be for 1 additional month. Nonetheless if she is not doing significantly better by the next time I see her I think that the biggest thing is going to be for [place] to go ahead and see what we can do about getting her started with hyperbaric oxygen therapy to try to save the toe. 02/20/2021 upon evaluation today patient presents for follow-up she actually came in a little bit early as the home health nurse was concerned about her toe becoming "gangrenous". With that being said the good news is there is [name] no evidence of this and in fact the toe seems to be doing better at this point. What was over the toe was an area that had bled significantly and then subsequently dried making it look much worse than where things actually stand. There is just a very small opening remaining in the toe. In regard to her legs everything appears to be healed bilaterally. 02/24/2021 upon evaluation today patient appears to be doing well with regard to her toe ulcer. I am actually very pleased with where we stand and I  think that she is making good progress at this time. There is still a very small opening although I think the biggest issue is the collagen seems to be getting stuck and drying up because the wound is so small we probably just need to use something to keep this moist like a little piece of Xeroform gauze and see if we get this to close. 03/07/2021 upon evaluation patient actually appears to be making excellent progress in regard to her last wound on the toe. There is just a very tiny potential  area that I can even be sure at first until I get some of the dry skin off when I did get this removed there was just a very tiny pinpoint opening that was barely even moist. Actually feel like this is very close to complete resolution. She has been using the Xeroform that is doing great. 03/25/2021 upon evaluation today patient appears to be doing well with regard to her toe ulcer. In fact this appears to be potentially completely healed. Fortunately I do not see anything draining at this point which is great news. She does have a small blood blister that is already drying up and looking okay on the third toe as well I am not sure what happened here she has an either. Otherwise she is having some weeping from the right leg this again is newer she tells me from last week weighing in that her pulmonologist and then today weighing in at the hematologist that she gained 30 pounds. This is quite significant if indeed true and I think she is to contact her primary care provider ASAP. This would account for why her right leg is weeping as well. 04/04/2020 upon evaluation today patient appears to be doing excellent in regard to her toe which I actually think is healed today. With that being said she unfortunately is having issues with her leg swelling and again several blisters on each leg that have reopened unfortunately. 05/02/2021 upon evaluation today patient appears to be doing well with regard to her wounds. In fact  is down just 1 on the anterior portion of her right leg which is actually from a blister. Otherwise she is doing awesome. 05/15/2021 upon evaluation today patient appears to be doing better in regard to her leg ulcer although she has a small irritated area from tape on the leg medially. She also has a skin tear to her right elbow region. Fortunately there does not appear to be any signs of active infection locally nor systemically at this point which is great news. No fevers, chills, nausea, vomiting, or diarrhea. 05/26/2021 upon evaluation today patient appears to be doing better in regard to the actual wounds although she is having a lot of leaking from the right leg. She is also been using her lymphedema pumps along with wearing her compression. Nonetheless she is having a lot of trouble with her breathing I am concerned that the pumps may be contributing to this. 06/12/2021 upon evaluation today patient appears to be doing better in regard to a lot of things including her breathing as well as her legs. She still has a lot of swelling but the good news is this is better she lost about 20 pounds of fluid when she went to the hospital and they diuresed her. Subsequently this is good news. Nonetheless she actually told me that "if she ever gets that bad again for me to yell at her to go to the hospital". She said that I was not quite forceful enough and telling her what she needed to do. She also notes she will try to be more receptive in the future. 06/19/2021 upon evaluation today patient appears to be doing well with regard to her wound. She is tolerating the dressing changes without complication. Fortunately her legs on the right are completely healed. She did have an area that she hit her Achilles on the left but this too seems to be pretty much closed at this point. [name], [name] ([id number]) [id number] Page 3 of 9 Readmission: 02-05-2022  upon evaluation today patient  appears to be doing well currently in regard to her left leg although her right leg is actually giving her quite a bit more trouble at this point. Fortunately there does not appear to be any signs of infection locally or systemically which is great news. No fevers, chills, nausea, vomiting, or diarrhea. Patient tells me that she began having issues more recently with her legs in the right leg is definitely worse than the left as far as the way she feels as well and she states it was around September 16 that she first noted this and then when she called it took about 2-1/2 weeks to get in to see me. Her past medical history really has not changed since I saw her back in March everything is pretty much about the same repeat ABIs appear to be doing well currently at 1.1 on the left and 1.12 on the right. 02-10-2022 upon evaluation today patient actually appears to be doing well. Culture actually showed evidence of Pseudomonas but her legs look amazing today I do not see any signs of infection I do not believe that we need to add anything especially in light of the fact there is a lot of interactions with anything to treat the Pseudomonas in her current medication regimen. For that reason we will just hold with where we stand and continue with the current therapy. 02-24-2022 upon evaluation today patient appears to be doing excellent in fact she appears to be completely healed based on what we are seeing. Readmission: 10-01-2022 upon evaluation today patient presents for follow-up evaluation here in the clinic she has not been seen since December 2023. At that point she was actually being seen for her legs that is actually doing much better which is great news. Overall very pleased with her leg situation and she has been doing great with her compression socks. With that being said unfortunately she does seem to be having issues with her heel this was secondary to having her left shoulder replacement and  subsequent to that she was in the hospital for 6 days during that time she developed a pressure wound on the left heel. Unfortunately that is now it is giving her the most trouble she also has some irritation of the right heel but is not nearly as significant as far as that is concerned there is no signs of active infection at this time which is great news and very pleased in that regard. 10-09-2022 upon evaluation today patient appears to be doing well currently in regard to her heel we are definitely seeing signs of improvement which is great news and in general I think that we are moving in the right direction. I do not see any evidence of worsening overall which is great news. 7/25; pressure ulcer at the tip of her heel she has been using Promogran and sit to foot with a border foam. She is a type II diabetic. She is attempting to religiously offload this area with Prevalon boots at night open heeled shoes during the day. Her measurements are a lot better today per our intake nurse 11-03-2022 upon evaluation today patient actually has not been there for 3 weeks is a combination of a couple missed appointments on her end and last week she had to cancel because of weather. Either way her foot seems to be doing well in regard to the heel on the left lateral area. This is not showing signs of any obvious infection and at  this point I think that she is making headway towards getting this closed although I do believe there is quite a bit of slough and biofilm skin need to be cleaned off just a little bit of callus around the edges of the wound. 11-10-2022 upon evaluation today patient appears to be doing well currently in regard to her heel ulcer. She has been tolerating the dressing changes without complication and in general I feel like she is making excellent headway towards complete closure. Electronic Signature(s) Signed: 11/10/2022 6:16:15 PM By: Allen Derry PA-C Entered By: Allen Derry on 11/10/2022  15:16:14 -------------------------------------------------------------------------------- Physical Exam Details Patient Name: Date of Service: [name] 11/10/2022 12:45 PM Medical Record Number: [id number] Patient Account Number: [id number] Date of Birth/Sex: Treating RN: Dec 06, 1950 (72 y.o. [name] Primary Care Provider: [name] Other Clinician: Betha Loa Referring Provider: Treating Provider/Extender: Algis Greenhouse Weeks in Treatment: 5 Constitutional Well-nourished and well-hydrated in no acute distress. Respiratory normal breathing without difficulty. Psychiatric this patient is able to make decisions and demonstrates good insight into disease process. Alert and Oriented x 3. pleasant and cooperative. Notes Upon inspection patient's wound bed actually showed signs of good granulation and epithelization at this point. Fortunately I do not see any evidence of worsening at this time which is great news and in general I do think that she is making headway towards complete closure here. I am pleased with the overall appearance of the wound. BRET, OTTERSTROM ([id number]) [id number] Page 4 of 9 Electronic Signature(s) Signed: 11/10/2022 6:16:33 PM By: Allen Derry PA-C Entered By: Allen Derry on 11/10/2022 15:16:33 -------------------------------------------------------------------------------- Physician Orders Details Patient Name: Date of Service: [name] 11/10/2022 12:45 PM Medical Record Number: [id number] Patient Account Number: [id number] Date of Birth/Sex: Treating RN: 1950-09-14 (72 y.o. [name] Primary Care Provider: [name] Other Clinician: Betha Loa Referring Provider: Treating Provider/Extender: Amedeo Kinsman in Treatment: 5 Verbal / Phone Orders: Yes Clinician: Midge Aver Read Back and Verified: Yes Diagnosis Coding ICD-10 Coding Code  Description E11.621 Type 2 diabetes mellitus with foot ulcer I87.323 Chronic venous hypertension (idiopathic) with inflammation of bilateral lower extremity L89.623 Pressure ulcer of left heel, stage 3 L89.616 Pressure-induced deep tissue damage of right heel Z79.01 Long term (current) use of anticoagulants I48.0 Paroxysmal atrial fibrillation Follow-up Appointments Return Appointment in 1 week. Home Health [facility] for wound care. May utilize formulary equivalent dressing for wound treatment orders unless otherwise specified. Home Health Nurse may visit PRN to address patients wound care needs. Rolene Arbour for wound care to left calcan [phone] Anesthetic (Use 'Patient Medications' Section for Anesthetic Order Entry) Lidocaine applied to wound bed Edema Control - Lymphedema / Segmental Compressive Device / Other Elevate, Exercise Daily and A void Standing for Long Periods of Time. Elevate legs to the level of the heart and pump ankles as often as possible Elevate leg(s) parallel to the floor when sitting. Wound Treatment Wound #24 - Calcaneus Wound Laterality: Left Cleanser: Normal Saline 3 x Per Week/30 Days Discharge Instructions: Wash your hands with soap and water. Remove old dressing, discard into plastic bag and place into trash. Cleanse the wound with Normal Saline prior to applying a clean dressing using gauze sponges, not tissues or cotton balls. Do not scrub or use excessive force. Pat dry using gauze sponges, not tissue or cotton balls. Cleanser: Soap and Water 3 x Per Week/30 Days Discharge Instructions: Gently cleanse wound with antibacterial soap,  rinse and pat dry prior to dressing wounds Prim Dressing: Promogran Matrix 4.34 (in) 3 x Per Week/30 Days ary Discharge Instructions: Moisten w/normal saline or sterile water; Cover wound as directed. Do not remove from wound bed. Secondary Dressing: (BORDER) Zetuvit Plus SILICONE BORDER Dressing 4x4 (in/in) 3 x  Per Week/30 Days Discharge Instructions: Please do not put silicone bordered dressings under wraps. Use non-bordered dressing only. Electronic Signature(s) Signed: 11/10/2022 6:45:54 PM By: Allen Derry PA-C Signed: 11/11/2022 5:02:51 PM By: Bronson Ing ([id number]) PM By: Tracey Harries.pdf Page 5 of 9 Signed: 11/11/2022 5:02:51 Entered By: Betha Loa on 11/10/2022 10:25:09 -------------------------------------------------------------------------------- Problem List Details Patient Name: Date of Service: [name] 11/10/2022 12:45 PM Medical Record Number: [id number] Patient Account Number: [id number] Date of Birth/Sex: Treating RN: 09/30/1950 (72 y.o. [name] Primary Care Provider: [name] Other Clinician: Betha Loa Referring Provider: Treating Provider/Extender: Algis Greenhouse Weeks in Treatment: 5 Active Problems ICD-10 Encounter Code Description Active Date MDM Diagnosis E11.621 Type 2 diabetes mellitus with foot ulcer 10/01/2022 No Yes I87.323 Chronic venous hypertension (idiopathic) with inflammation of bilateral lower 10/01/2022 No Yes extremity L89.623 Pressure ulcer of left heel, stage 3 10/01/2022 No Yes L89.616 Pressure-induced deep tissue damage of right heel 10/01/2022 No Yes Z79.01 Long term (current) use of anticoagulants 10/01/2022 No Yes I48.0 Paroxysmal atrial fibrillation 10/01/2022 No Yes Inactive Problems Resolved Problems Electronic Signature(s) Signed: 11/10/2022 1:07:21 PM By: Allen Derry PA-C Entered By: Allen Derry on 11/10/2022 10:07:20 [name] ([id number]) [id number] Page 6 of 9 -------------------------------------------------------------------------------- Progress Note Details Patient Name: Date of Service: [name] 11/10/2022 12:45 PM Medical Record Number: [id number] Patient Account Number: [id number] Date of  Birth/Sex: Treating RN: 12-13-1950 (72 y.o. [name] Primary Care Provider: [name] Other Clinician: Betha Loa Referring Provider: Treating Provider/Extender: Algis Greenhouse Weeks in Treatment: 5 Subjective Chief Complaint Information obtained from Patient Bilateral heel ulcers History of Present Illness (HPI) 06/28/20 upon evaluation today patient appears to be doing unfortunately somewhat poorly in regard to wounds that she has over her lower extremities. She has a wound on the left distal second toe, left posterior heel, and right anterior lower leg. With that being said all in all none of the wounds appear to be too bad the one that hurts her the most is actually the wound on the posterior heel which actually is also one of the smallest. Nonetheless I think there is some callus hiding what is really going on in this area. Fortunately there does not appear to be any obvious evidence of infection which is great news. Patient does have a history of diabetes mellitus type 2, chronic venous insufficiency, hypertension, history of DVT and is on long-term anticoagulant therapy, Eliquis. s, 07/05/20 upon evaluation today patient appears to be doing well currently with regard to her wounds. I feel like she is making progress which is great news and overall there is no signs of active infection at this time. No fevers, chills, nausea, vomiting, or diarrhea. 4/29; patient has a only a small area remaining on the tip of her left second toe and a small area remaining on the left heel. The area on the right anterior lower leg is healed. They tell me that she had remote podiatric surgery on the toes of the left foot however they are very fixed in position and I wonder whether they are making it difficult to relieve pressure in her foot  wear. She is a type II diabetic with neuropathy as well. 08/01/2020 upon evaluation today patient appears to be doing well currently in regard to  her wounds. In fact everything appears to be doing much better. The posterior aspect of her heel is actually giving her some trouble not the Achilles area but a small wound that we did not even have marked. In fact I had noted this before. Nonetheless we are going to addressed that today. 5/27; most of the patient's wounds are healed including the left Achilles heel left toe. Right forearm is also healed. She has a new abrasion posteriorly in the right elbow area just above the olecranon this happened today with an abrasion from her car door 6/15; the patient's wounds on the left Achilles and left second toe remain healed. The area on the right forearm is also just about healed in fact the skin I replaced last time is looking viable which is good. She had me look at the left heel again which is not in the area of the wound more towards the tip of the heel at the Achilles insertion site as well as the tip of the left foot second toe. In the Achilles area that is clearly is friction probably in bed at night. Not really near where the wound was. The left second toe remains healed although this is hammered and overhanging first toe also puts pressure in this area 09/13/2020 upon evaluation today patient appears to be doing well with regard to all previous wounds that she had. Everything is completely healed. With that being said I do think that the patient unfortunately has a new skin tear on the right forearm which is due to her put a Band-Aid on it and then when it pulled off this caused some issues with skin tearing. Nonetheless I think that this needs to be addressed today. Hopefully will take too long to get this to heal. She is having some issues with pain in her heel but I think this is more related to be honest to her neuropathy as opposed to anything else. Readmission: 10/30/2020 upon evaluation today patient appears to be doing poorly in regard to her legs bilaterally. She is also having an issue  with her fourth toe on the left. Fortunately there does not appear to be any signs of active infection at this time. No fevers, chills, nausea, vomiting, or diarrhea. It is actually been quite a while since we have seen her. In fact June 24 was the last time that she was here in the clinic. She tells me she did not come back because things were just doing "pretty well". 11/29/2020 upon evaluation today patient actually appears to be doing well with regard to her arms those are completely healed. Her right leg is still open but doing okay. The fourth toe of the left foot does show some signs here of having some issues with necrotic tissue of an open wound. This is where a toenail was removed by podiatry. Subsequently I do think that we need to go ahead and see what we can do about getting this cleared away so being try to get some healing going forward. 12/13/2020 upon evaluation today patient appears to be doing decently well in regard to her wound on the right leg. In general I think that she is making good progress here. Unfortunately she continues to have significant issues with swelling in the left leg is now even more swollen at this point. For that reason  I do believe she would be benefited by wrapping both sides although she is not interested in doing this. Overall I think that she is definitely making some good progress here nonetheless in regard to the right leg left leg leave some to be desired. 12/27/2020 upon evaluation today patient appears to be doing well with regard to her wound in regard to the legs. Unfortunately the toe was not doing nearly as well. I am much more concerned about infection and osteomyelitis here. She had the MRI but right now were not seeing obvious evidence of redness spreading up to her foot this is good news I do not have the result of the MRI as of yet. I am good to place her on doxycycline however due to the fact that the wound does seem to be getting a little  worse in regard to her toe. 01/14/2021 upon evaluation today patient appears to be doing well with regard to her ulcers. The right leg is doing well and even the toe seems to be doing well though she still has some evidence of bone exposed on the toe which does not appear to be doing quite as good as I would like to see just and the fact that is still not covered over new tissue but looks tremendously better compared to 2 weeks ago. Overall very pleased in that regard. 01/28/2021 upon evaluation today patient's leg on the left actually appears to have new wounds this is due to her deciding to shave yesterday. Fortunately there does not appear to be any signs of infection currently. This is good news. Nonetheless I do believe that with these new areas open it would be best to wrap her leg to try to make sure we get this under control sooner rather than later. With regard to her toe there still does appear to be some evidence of bone exposed this was covered over with callus I did remove that today. Nonetheless I think this is significantly smaller than previous. BRITTON, KOSTIUK ([id number]) [id number] Page 7 of 9 02/11/2021 upon evaluation today patient appears to be doing okay in regard to her leg ulcers. I do not see anything obvious at this point this seems to be a complicating factor. I think that she is getting better. Were wrap of the right leg not the left leg. With that being said the left fourth toe ulcer still does have evidence of being open there is some drainage still on top of the wound I think we can to try to see about using something like a collagen dressing today to see if that would be of benefit for her. She is in agreement with giving this a trial. Nonetheless I am concerned about the fact that she continues to have issues here with this not healing it is confirmed chronic osteomyelitis she has been on antibiotics. I am going to extend those today as well.  That is Keflex and that will be for 1 additional month. Nonetheless if she is not doing significantly better by the next time I see her I think that the biggest thing is going to be for [place] to go ahead and see what we can do about getting her started with hyperbaric oxygen therapy to try to save the toe. 02/20/2021 upon evaluation today patient presents for follow-up she actually came in a little bit early as the home health nurse was concerned about her toe becoming "gangrenous". With that being said the good news is there is [name] no  evidence of this and in fact the toe seems to be doing better at this point. What was over the toe was an area that had bled significantly and then subsequently dried making it look much worse than where things actually stand. There is just a very small opening remaining in the toe. In regard to her legs everything appears to be healed bilaterally. 02/24/2021 upon evaluation today patient appears to be doing well with regard to her toe ulcer. I am actually very pleased with where we stand and I think that she is making good progress at this time. There is still a very small opening although I think the biggest issue is the collagen seems to be getting stuck and drying up because the wound is so small we probably just need to use something to keep this moist like a little piece of Xeroform gauze and see if we get this to close. 03/07/2021 upon evaluation patient actually appears to be making excellent progress in regard to her last wound on the toe. There is just a very tiny potential area that I can even be sure at first until I get some of the dry skin off when I did get this removed there was just a very tiny pinpoint opening that was barely even moist. Actually feel like this is very close to complete resolution. She has been using the Xeroform that is doing great. 03/25/2021 upon evaluation today patient appears to be doing well with regard to her toe ulcer. In fact this  appears to be potentially completely healed. Fortunately I do not see anything draining at this point which is great news. She does have a small blood blister that is already drying up and looking okay on the third toe as well I am not sure what happened here she has an either. Otherwise she is having some weeping from the right leg this again is newer she tells me from last week weighing in that her pulmonologist and then today weighing in at the hematologist that she gained 30 pounds. This is quite significant if indeed true and I think she is to contact her primary care provider ASAP. This would account for why her right leg is weeping as well. 04/04/2020 upon evaluation today patient appears to be doing excellent in regard to her toe which I actually think is healed today. With that being said she unfortunately is having issues with her leg swelling and again several blisters on each leg that have reopened unfortunately. 05/02/2021 upon evaluation today patient appears to be doing well with regard to her wounds. In fact is down just 1 on the anterior portion of her right leg which is actually from a blister. Otherwise she is doing awesome. 05/15/2021 upon evaluation today patient appears to be doing better in regard to her leg ulcer although she has a small irritated area from tape on the leg medially. She also has a skin tear to her right elbow region. Fortunately there does not appear to be any signs of active infection locally nor systemically at this point which is great news. No fevers, chills, nausea, vomiting, or diarrhea. 05/26/2021 upon evaluation today patient appears to be doing better in regard to the actual wounds although she is having a lot of leaking from the right leg. She is also been using her lymphedema pumps along with wearing her compression. Nonetheless she is having a lot of trouble with her breathing I am concerned that the pumps may be contributing to this.  06/12/2021 upon  evaluation today patient appears to be doing better in regard to a lot of things including her breathing as well as her legs. She still has a lot of swelling but the good news is this is better she lost about 20 pounds of fluid when she went to the hospital and they diuresed her. Subsequently this is good news. Nonetheless she actually told me that "if she ever gets that bad again for me to yell at her to go to the hospital". She said that I was not quite forceful enough and telling her what she needed to do. She also notes she will try to be more receptive in the future. 06/19/2021 upon evaluation today patient appears to be doing well with regard to her wound. She is tolerating the dressing changes without complication. Fortunately her legs on the right are completely healed. She did have an area that she hit her Achilles on the left but this too seems to be pretty much closed at this point. Readmission: 02-05-2022 upon evaluation today patient appears to be doing well currently in regard to her left leg although her right leg is actually giving her quite a bit more trouble at this point. Fortunately there does not appear to be any signs of infection locally or systemically which is great news. No fevers, chills, nausea, vomiting, or diarrhea. Patient tells me that she began having issues more recently with her legs in the right leg is definitely worse than the left as far as the way she feels as well and she states it was around September 16 that she first noted this and then when she called it took about 2-1/2 weeks to get in to see me. Her past medical history really has not changed since I saw her back in March everything is pretty much about the same repeat ABIs appear to be doing well currently at 1.1 on the left and 1.12 on the right. 02-10-2022 upon evaluation today patient actually appears to be doing well. Culture actually showed evidence of Pseudomonas but her legs look amazing today I do  not see any signs of infection I do not believe that we need to add anything especially in light of the fact there is a lot of interactions with anything to treat the Pseudomonas in her current medication regimen. For that reason we will just hold with where we stand and continue with the current therapy. 02-24-2022 upon evaluation today patient appears to be doing excellent in fact she appears to be completely healed based on what we are seeing. Readmission: 10-01-2022 upon evaluation today patient presents for follow-up evaluation here in the clinic she has not been seen since December 2023. At that point she was actually being seen for her legs that is actually doing much better which is great news. Overall very pleased with her leg situation and she has been doing great with her compression socks. With that being said unfortunately she does seem to be having issues with her heel this was secondary to having her left shoulder replacement and subsequent to that she was in the hospital for 6 days during that time she developed a pressure wound on the left heel. Unfortunately that is now it is giving her the most trouble she also has some irritation of the right heel but is not nearly as significant as far as that is concerned there is no signs of active infection at this time which is great news and very pleased in that regard.  10-09-2022 upon evaluation today patient appears to be doing well currently in regard to her heel we are definitely seeing signs of improvement which is great news and in general I think that we are moving in the right direction. I do not see any evidence of worsening overall which is great news. 7/25; pressure ulcer at the tip of her heel she has been using Promogran and sit to foot with a border foam. She is a type II diabetic. She is attempting to religiously offload this area with Prevalon boots at night open heeled shoes during the day. Her measurements are a lot better today  per our intake nurse 11-03-2022 upon evaluation today patient actually has not been there for 3 weeks is a combination of a couple missed appointments on her end and last week she had to cancel because of weather. Either way her foot seems to be doing well in regard to the heel on the left lateral area. This is not showing signs of any obvious infection and at this point I think that she is making headway towards getting this closed although I do believe there is quite a bit of slough and biofilm skin need to be cleaned off just a little bit of callus around the edges of the wound. 11-10-2022 upon evaluation today patient appears to be doing well currently in regard to her heel ulcer. She has been tolerating the dressing changes without complication and in general I feel like she is making excellent headway towards complete closure. [name], [name] ([id number]) [id number] Page 8 of 9 Objective Constitutional Well-nourished and well-hydrated in no acute distress. Vitals Time Taken: 1:00 PM, Height: 64 in, Weight: 250 lbs, BMI: 42.9, Temperature: 97.5 F, Pulse: 59 bpm, Respiratory Rate: 18 breaths/min, Blood Pressure: 124/75 mmHg. Respiratory normal breathing without difficulty. Psychiatric this patient is able to make decisions and demonstrates good insight into disease process. Alert and Oriented x 3. pleasant and cooperative. General Notes: Upon inspection patient's wound bed actually showed signs of good granulation and epithelization at this point. Fortunately I do not see any evidence of worsening at this time which is great news and in general I do think that she is making headway towards complete closure here. I am pleased with the overall appearance of the wound. Integumentary (Hair, Skin) Wound #24 status is Open. Original cause of wound was Gradually Appeared. The date acquired was: 08/22/2022. The wound has been in treatment 5 weeks. The wound is located on  the Left Calcaneus. The wound measures 0.8cm length x 1cm width x 0.2cm depth; 0.628cm^2 area and 0.126cm^3 volume. There is Fat Layer (Subcutaneous Tissue) exposed. There is a small amount of serous drainage noted. The wound margin is distinct with the outline attached to the wound base. There is small (1-33%) red granulation within the wound bed. There is a medium (34-66%) amount of necrotic tissue within the wound bed including Adherent Slough and Necrosis of Bone. Assessment Active Problems ICD-10 Type 2 diabetes mellitus with foot ulcer Chronic venous hypertension (idiopathic) with inflammation of bilateral lower extremity Pressure ulcer of left heel, stage 3 Pressure-induced deep tissue damage of right heel Long term (current) use of anticoagulants Paroxysmal atrial fibrillation Plan Follow-up Appointments: Return Appointment in 1 week. Home Health: [facility] for wound care. May utilize formulary equivalent dressing for wound treatment orders unless otherwise specified. Home Health Nurse may visit PRN to address patients wound care needs. Rolene Arbour for wound care to left calcan [phone] Anesthetic (Use 'Patient  Medications' Section for Anesthetic Order Entry): Lidocaine applied to wound bed Edema Control - Lymphedema / Segmental Compressive Device / Other: Elevate, Exercise Daily and Avoid Standing for Long Periods of Time. Elevate legs to the level of the heart and pump ankles as often as possible Elevate leg(s) parallel to the floor when sitting. WOUND #24: - Calcaneus Wound Laterality: Left Cleanser: Normal Saline 3 x Per Week/30 Days Discharge Instructions: Wash your hands with soap and water. Remove old dressing, discard into plastic bag and place into trash. Cleanse the wound with Normal Saline prior to applying a clean dressing using gauze sponges, not tissues or cotton balls. Do not scrub or use excessive force. Pat dry using gauze sponges, not tissue or  cotton balls. Cleanser: Soap and Water 3 x Per Week/30 Days Discharge Instructions: Gently cleanse wound with antibacterial soap, rinse and pat dry prior to dressing wounds Prim Dressing: Promogran Matrix 4.34 (in) 3 x Per Week/30 Days ary Discharge Instructions: Moisten w/normal saline or sterile water; Cover wound as directed. Do not remove from wound bed. Secondary Dressing: (BORDER) Zetuvit Plus SILICONE BORDER Dressing 4x4 (in/in) 3 x Per Week/30 Days Discharge Instructions: Please do not put silicone bordered dressings under wraps. Use non-bordered dressing only. 1. I am going to recommend that we have the patient continue to monitor for any signs of infection or worsening. Based on what I see I do think that we are making excellent headway towards closure. 2. I am also going to recommend the patient should continue with the silver collagen which is doing an awesome job here. 3. I am in recommend that we cover this with a bordered foam dressing as well. We will see patient back for reevaluation in 1 week here in the clinic. If anything worsens or changes patient will contact our office for additional recommendations. [name], [name] ([id number]) [id number] Page 9 of 9 Electronic Signature(s) Signed: 11/10/2022 6:17:18 PM By: Allen Derry PA-C Entered By: Allen Derry on 11/10/2022 15:17:18 -------------------------------------------------------------------------------- SuperBill Details Patient Name: Date of Service: [name], [name] 11/10/2022 Medical Record Number: [id number] Patient Account Number: [id number] Date of Birth/Sex: Treating RN: 08/06/1950 (72 y.o. [name] Primary Care Provider: [name] Other Clinician: Betha Loa Referring Provider: Treating Provider/Extender: Algis Greenhouse Weeks in Treatment: 5 Diagnosis Coding ICD-10 Codes Code Description [phone] Type 2 diabetes mellitus with foot ulcer I87.323 Chronic  venous hypertension (idiopathic) with inflammation of bilateral lower extremity L89.623 Pressure ulcer of left heel, stage 3 L89.616 Pressure-induced deep tissue damage of right heel Z79.01 Long term (current) use of anticoagulants I48.0 Paroxysmal atrial fibrillation Facility Procedures : CPT4 Code: [id number] Description: 99213 - WOUND CARE VISIT-LEV 3 EST PT Modifier: Quantity: 1 Physician Procedures : CPT4 Code Description Modifier [id number] 99213 - WC PHYS LEVEL 3 - EST PT ICD-10 Diagnosis Description E11.621 Type 2 diabetes mellitus with foot ulcer I87.323 Chronic venous hypertension (idiopathic) with inflammation of bilateral lower extremity  L89.623 Pressure ulcer of left heel, stage 3 L89.616 Pressure-induced deep tissue damage of right heel Quantity: 1 Electronic Signature(s) Signed: 11/10/2022 6:20:27 PM By: Allen Derry PA-C Entered By: Allen Derry on 11/10/2022 15:20:27

## 2022-11-11 DIAGNOSIS — M797 Fibromyalgia: Secondary | ICD-10-CM | POA: Diagnosis not present

## 2022-11-11 DIAGNOSIS — D509 Iron deficiency anemia, unspecified: Secondary | ICD-10-CM | POA: Diagnosis not present

## 2022-11-11 DIAGNOSIS — Z471 Aftercare following joint replacement surgery: Secondary | ICD-10-CM | POA: Diagnosis not present

## 2022-11-11 DIAGNOSIS — F32A Depression, unspecified: Secondary | ICD-10-CM | POA: Diagnosis not present

## 2022-11-11 DIAGNOSIS — L89620 Pressure ulcer of left heel, unstageable: Secondary | ICD-10-CM | POA: Diagnosis not present

## 2022-11-11 DIAGNOSIS — L89610 Pressure ulcer of right heel, unstageable: Secondary | ICD-10-CM | POA: Diagnosis not present

## 2022-11-11 DIAGNOSIS — E1151 Type 2 diabetes mellitus with diabetic peripheral angiopathy without gangrene: Secondary | ICD-10-CM | POA: Diagnosis not present

## 2022-11-11 DIAGNOSIS — F419 Anxiety disorder, unspecified: Secondary | ICD-10-CM | POA: Diagnosis not present

## 2022-11-11 DIAGNOSIS — I10 Essential (primary) hypertension: Secondary | ICD-10-CM | POA: Diagnosis not present

## 2022-11-11 DIAGNOSIS — R0681 Apnea, not elsewhere classified: Secondary | ICD-10-CM | POA: Diagnosis not present

## 2022-11-11 DIAGNOSIS — J4489 Other specified chronic obstructive pulmonary disease: Secondary | ICD-10-CM | POA: Diagnosis not present

## 2022-11-11 DIAGNOSIS — R41841 Cognitive communication deficit: Secondary | ICD-10-CM | POA: Diagnosis not present

## 2022-11-11 DIAGNOSIS — N390 Urinary tract infection, site not specified: Secondary | ICD-10-CM | POA: Diagnosis not present

## 2022-11-11 DIAGNOSIS — G4733 Obstructive sleep apnea (adult) (pediatric): Secondary | ICD-10-CM | POA: Diagnosis not present

## 2022-11-11 DIAGNOSIS — G4700 Insomnia, unspecified: Secondary | ICD-10-CM | POA: Diagnosis not present

## 2022-11-11 DIAGNOSIS — I4891 Unspecified atrial fibrillation: Secondary | ICD-10-CM | POA: Diagnosis not present

## 2022-11-13 NOTE — Progress Notes (Signed)
[name] ([id number]) [id number] Page 1 of 9 Visit Report for 11/10/2022 Arrival Information Details Patient Name: Date of Service: [name] 11/10/2022 12:45 PM Medical Record Number: [id number] Patient Account Number: [id number] Date of Birth/Sex: Treating RN: Aug 11, 1950 (72 y.o. [name] Primary Care Paydon Carll: [name] Other Clinician: Betha Loa Referring Danyella Mcginty: Treating Jada Kuhnert/Extender: Amedeo Kinsman in Treatment: 5 Visit Information History Since Last Visit All ordered tests and consults were completed: No Patient Arrived: Wheel Chair Added or deleted any medications: No Arrival Time: 12:55 Any new allergies or adverse reactions: No Transfer Assistance: EasyPivot Patient Lift Had a fall or experienced change in No Patient Identification Verified: Yes activities of daily living that may affect Secondary Verification Process Completed: Yes risk of falls: Patient Requires Transmission-Based Precautions: No Signs or symptoms of abuse/neglect since last visito No Patient Has Alerts: Yes Hospitalized since last visit: No Patient Alerts: Patient on Blood Thinner Implantable device outside of the clinic excluding No ABI L 1.1 02/05/22 cellular tissue based products placed in the center ABI R 1.12 02/05/22 since last visit: Has Dressing in Place as Prescribed: Yes Pain Present Now: Yes Electronic Signature(s) Signed: 11/11/2022 5:02:51 PM By: Betha Loa Entered By: Betha Loa on 11/10/2022 09:59:33 -------------------------------------------------------------------------------- Clinic Level of Care Assessment Details Patient Name: Date of Service: [name] 11/10/2022 12:45 PM Medical Record Number: [id number] Patient Account Number: [id number] Date of Birth/Sex: Treating RN: July 07, 1950 (72 y.o. [name] Primary Care Jeane Cashatt: [name] Other Clinician: Betha Loa Referring Linville Decarolis: Treating Elanna Bert/Extender: Algis Greenhouse Weeks in Treatment: 5 Clinic Level of Care Assessment Items TOOL 4 Quantity Score []  - 0 Use when only an EandM is performed on FOLLOW-UP visit ASSESSMENTS - Nursing Assessment / Reassessment X- 1 10 Reassessment of Co-morbidities (includes updates in patient status) X- 1 5 Reassessment of Adherence to Treatment Plan SARETHA, DRUCK B ([id number]) [id number] Page 2 of 9 ASSESSMENTS - Wound and Skin A ssessment / Reassessment X - Simple Wound Assessment / Reassessment - one wound 1 5 []  - 0 Complex Wound Assessment / Reassessment - multiple wounds []  - 0 Dermatologic / Skin Assessment (not related to wound area) ASSESSMENTS - Focused Assessment X- 1 5 Circumferential Edema Measurements - multi extremities []  - 0 Nutritional Assessment / Counseling / Intervention []  - 0 Lower Extremity Assessment (monofilament, tuning fork, pulses) []  - 0 Peripheral Arterial Disease Assessment (using hand held doppler) ASSESSMENTS - Ostomy and/or Continence Assessment and Care []  - 0 Incontinence Assessment and Management []  - 0 Ostomy Care Assessment and Management (repouching, etc.) PROCESS - Coordination of Care X - Simple Patient / Family Education for ongoing care 1 15 []  - 0 Complex (extensive) Patient / Family Education for ongoing care []  - 0 Staff obtains Chiropractor, Records, T Results / Process Orders est []  - 0 Staff telephones HHA, Nursing Homes / Clarify orders / etc []  - 0 Routine Transfer to another Facility (non-emergent condition) []  - 0 Routine Hospital Admission (non-emergent condition) []  - 0 New Admissions / Manufacturing engineer / Ordering NPWT Apligraf, etc. , []  - 0 Emergency Hospital Admission (emergent condition) X- 1 10 Simple Discharge Coordination []  - 0 Complex (extensive) Discharge Coordination PROCESS - Special Needs []  - 0 Pediatric / Minor  Patient Management []  - 0 Isolation Patient Management []  - 0 Hearing / Language / Visual special needs []  - 0 Assessment of Community assistance (transportation, D/C planning, etc.) []  - 0  Additional assistance / Altered mentation []  - 0 Support Surface(s) Assessment (bed, cushion, seat, etc.) INTERVENTIONS - Wound Cleansing / Measurement X - Simple Wound Cleansing - one wound 1 5 []  - 0 Complex Wound Cleansing - multiple wounds X- 1 5 Wound Imaging (photographs - any number of wounds) []  - 0 Wound Tracing (instead of photographs) X- 1 5 Simple Wound Measurement - one wound []  - 0 Complex Wound Measurement - multiple wounds INTERVENTIONS - Wound Dressings []  - 0 Small Wound Dressing one or multiple wounds X- 1 15 Medium Wound Dressing one or multiple wounds []  - 0 Large Wound Dressing one or multiple wounds []  - 0 Application of Medications - topical []  - 0 Application of Medications - injection INTERVENTIONS - Miscellaneous []  - 0 External ear exam CLAYRE, BRANDY B ([id number]) [id number] Page 3 of 9 []  - 0 Specimen Collection (cultures, biopsies, blood, body fluids, etc.) []  - 0 Specimen(s) / Culture(s) sent or taken to Lab for analysis []  - 0 Patient Transfer (multiple staff / Michiel Sites Lift / Similar devices) []  - 0 Simple Staple / Suture removal (25 or less) []  - 0 Complex Staple / Suture removal (26 or more) []  - 0 Hypo / Hyperglycemic Management (close monitor of Blood Glucose) []  - 0 Ankle / Brachial Index (ABI) - do not check if billed separately X- 1 5 Vital Signs Has the patient been seen at the hospital within the last three years: Yes Total Score: 85 Level Of Care: New/Established - Level 3 Electronic Signature(s) Signed: 11/11/2022 5:02:51 PM By: Betha Loa Entered By: Betha Loa on 11/10/2022 10:25:41 -------------------------------------------------------------------------------- Encounter Discharge Information  Details Patient Name: Date of Service: [name] 11/10/2022 12:45 PM Medical Record Number: [id number] Patient Account Number: [id number] Date of Birth/Sex: Treating RN: Jan 07, 1951 (72 y.o. [name] Primary Care Jakya Dovidio: [name] Other Clinician: Betha Loa Referring Afnan Emberton: Treating Lovette Merta/Extender: Amedeo Kinsman in Treatment: 5 Encounter Discharge Information Items Discharge Condition: Stable Ambulatory Status: Wheelchair Discharge Destination: Home Transportation: Private Auto Accompanied By: husband Schedule Follow-up Appointment: Yes Clinical Summary of Care: Electronic Signature(s) Signed: 11/11/2022 5:02:51 PM By: Betha Loa Entered By: Betha Loa on 11/10/2022 10:36:24 Lower Extremity Assessment Details -------------------------------------------------------------------------------- Leonel Ramsay ([id number]) [id number] Page 4 of 9 Patient Name: Date of Service: [name] 11/10/2022 12:45 PM Medical Record Number: [id number] Patient Account Number: [id number] Date of Birth/Sex: Treating RN: [birth year] (72 y.o. [name] Primary Care Jody Aguinaga: [name] Other Clinician: Betha Loa Referring Quita Mcgrory: Treating Latarsha Zani/Extender: Algis Greenhouse Weeks in Treatment: 5 Edema Assessment Left: Right: Assessed: Yes Yes Edema: Yes No Calf Left: Right: Point of Measurement: 33 cm From Medial Instep 33 cm 33 cm Ankle Left: Right: Point of Measurement: 7 cm From Medial Instep 17 cm 18.7 cm Knee To Floor Left: Right: From Medial Instep 46 cm 46 cm Vascular Assessment Left: Right: Pulses: Dorsalis Pedis Palpable: Yes Yes Electronic Signature(s) Signed: 11/11/2022 5:02:51 PM By: Betha Loa Signed: 11/13/2022 1:24:46 PM By: Midge Aver MSN RN CNS WTA Entered By: Betha Loa on 11/10/2022  10:15:31 -------------------------------------------------------------------------------- Multi Wound Chart Details Patient Name: Date of Service: [name] 11/10/2022 12:45 PM Medical Record Number: [id number] Patient Account Number: [id number] Date of Birth/Sex: Treating RN: July 08, 1950 (72 y.o. [name] Primary Care Sylus Stgermain: [name] Other Clinician: Betha Loa Referring Dhana Totton: Treating Megan Hayduk/Extender: Algis Greenhouse Weeks in Treatment: 5 Vital Signs Height(in): 64 Pulse(bpm): 59 Weight(lbs):  250 Blood Pressure(mmHg): 124/75 Body Mass Index(BMI): 42.9 Temperature(F): 97.5 Respiratory Rate(breaths/min): 18 [24:Photos:] [N/A:N/A] Left Calcaneus N/A N/A Wound Location: Gradually Appeared N/A N/A Wounding Event: Pressure Ulcer N/A N/A Primary Etiology: Asthma, Pneumothorax, Arrhythmia, N/A N/A Comorbid History: Hypertension, Type II Diabetes, Gout, Osteoarthritis, Neuropathy 08/22/2022 N/A N/A Date Acquired: 5 N/A N/A Weeks of Treatment: Open N/A N/A Wound Status: No N/A N/A Wound Recurrence: 0.8x1x0.2 N/A N/A Measurements L x W x D (cm) 0.628 N/A N/A A (cm) : rea 0.126 N/A N/A Volume (cm) : 46.70% N/A N/A % Reduction in A rea: -6.80% N/A N/A % Reduction in Volume: Category/Stage III N/A N/A Classification: Small N/A N/A Exudate A mount: Serous N/A N/A Exudate Type: amber N/A N/A Exudate Color: Distinct, outline attached N/A N/A Wound Margin: Small (1-33%) N/A N/A Granulation A mount: Red N/A N/A Granulation Quality: Medium (34-66%) N/A N/A Necrotic A mount: Fat Layer (Subcutaneous Tissue): Yes N/A N/A Exposed Structures: Fascia: No Tendon: No Muscle: No Joint: No Bone: No None N/A N/A Epithelialization: Treatment Notes Electronic Signature(s) Signed: 11/11/2022 5:02:51 PM By: Betha Loa Entered By: Betha Loa on 11/10/2022  10:15:37 -------------------------------------------------------------------------------- Multi-Disciplinary Care Plan Details Patient Name: Date of Service: [name] 11/10/2022 12:45 PM Medical Record Number: [id number] Patient Account Number: [id number] Date of Birth/Sex: Treating RN: 11-May-1950 (72 y.o. [name] Primary Care Asharia Lotter: [name] Other Clinician: Betha Loa Referring Mariangela Heldt: Treating Avry Roedl/Extender: Algis Greenhouse Weeks in Treatment: 5 Active Inactive Necrotic Tissue Nursing Diagnoses: Knowledge deficit related to management of necrotic/devitalized tissue Goals: Necrotic/devitalized tissue will be minimized in the wound bed Date Initiated: 10/01/2022 Target Resolution Date: 11/01/2022 Goal Status: Active Interventions: ABIAH, KOGLER ([id number]) [id number] Page 6 of 9 Assess patient pain level pre-, during and post procedure and prior to discharge Treatment Activities: Apply topical anesthetic as ordered : 10/01/2022 Notes: Wound/Skin Impairment Nursing Diagnoses: Knowledge deficit related to ulceration/compromised skin integrity Goals: Patient/caregiver will verbalize understanding of skin care regimen Date Initiated: 10/01/2022 Target Resolution Date: 11/01/2022 Goal Status: Active Ulcer/skin breakdown will have a volume reduction of 30% by week 4 Date Initiated: 10/01/2022 Target Resolution Date: 11/01/2022 Goal Status: Active Ulcer/skin breakdown will have a volume reduction of 50% by week 8 Date Initiated: 10/01/2022 Target Resolution Date: 12/02/2022 Goal Status: Active Ulcer/skin breakdown will have a volume reduction of 80% by week 12 Date Initiated: 10/01/2022 Target Resolution Date: 01/01/2023 Goal Status: Active Ulcer/skin breakdown will heal within 14 weeks Date Initiated: 10/01/2022 Target Resolution Date: 02/01/2023 Goal Status: Active Interventions: Assess patient/caregiver  ability to obtain necessary supplies Assess patient/caregiver ability to perform ulcer/skin care regimen upon admission and as needed Assess ulceration(s) every visit Notes: Electronic Signature(s) Signed: 11/11/2022 5:02:51 PM By: Betha Loa Signed: 11/13/2022 1:24:46 PM By: Midge Aver MSN RN CNS WTA Entered By: Betha Loa on 11/10/2022 10:25:54 -------------------------------------------------------------------------------- Pain Assessment Details Patient Name: Date of Service: [name] 11/10/2022 12:45 PM Medical Record Number: [id number] Patient Account Number: [id number] Date of Birth/Sex: Treating RN: 28-Jun-1950 (72 y.o. [name] Primary Care Que Meneely: [name] Other Clinician: Betha Loa Referring Kuron Docken: Treating Giselle Brutus/Extender: Algis Greenhouse Weeks in Treatment: 5 Active Problems Location of Pain Severity and Description of Pain Patient Has Paino Yes Site Locations Pain Location: PEPSI, BONESTEEL ([id number]) [phone].pdf Page 7 of 9 Pain Location: Pain in Ulcers Duration of the Pain. Constant / Intermittento Constant Rate the pain. Current Pain Level: 5 Character of Pain Describe the Pain: Sharp Pain Management  and Medication Current Pain Management: Medication: Yes Cold Application: No Rest: No Massage: No Activity: No T.E.N.S.: No Heat Application: No Leg drop or elevation: No Is the Current Pain Management Adequate: Inadequate How does your wound impact your activities of daily livingo Sleep: No Bathing: No Appetite: No Relationship With Others: No Bladder Continence: No Emotions: No Bowel Continence: No Work: No Toileting: No Drive: No Dressing: No Hobbies: No Electronic Signature(s) Signed: 11/11/2022 5:02:51 PM By: Betha Loa Signed: 11/13/2022 1:24:46 PM By: Midge Aver MSN RN CNS WTA Entered By: Betha Loa on 11/10/2022  10:02:57 -------------------------------------------------------------------------------- Patient/Caregiver Education Details Patient Name: Date of Service: [name] 8/20/2024andnbsp12:45 PM Medical Record Number: [id number] Patient Account Number: [id number] Date of Birth/Gender: Treating RN: 03-Jun-1950 (72 y.o. [name] Primary Care Physician: [name] Other Clinician: Betha Loa Referring Physician: Treating Physician/Extender: Amedeo Kinsman in Treatment: 5 Education Assessment Education Provided To: Patient Education Topics Provided Wound/Skin ImpairmentJAQUIA, GIZZI ([id number]) [phone].pdf Page 8 of 9 Electronic Signature(s) Signed: 11/11/2022 5:02:51 PM By: Betha Loa Entered By: Betha Loa on 11/10/2022 10:26:05 -------------------------------------------------------------------------------- Wound Assessment Details Patient Name: Date of Service: [name] 11/10/2022 12:45 PM Medical Record Number: [id number] Patient Account Number: [id number] Date of Birth/Sex: Treating RN: 08/16/1950 (72 y.o. [name] Primary Care Bertha Earwood: [name] Other Clinician: Betha Loa Referring Karinne Schmader: Treating Shakeira Rhee/Extender: Algis Greenhouse Weeks in Treatment: 5 Wound Status Wound Number: 24 Primary Pressure Ulcer Etiology: Wound Location: Left Calcaneus Wound Open Wounding Event: Gradually Appeared Status: Date Acquired: 08/22/2022 Comorbid Asthma, Pneumothorax, Arrhythmia, Hypertension, Type II Weeks Of Treatment: 5 History: Diabetes, Gout, Osteoarthritis, Neuropathy Clustered Wound: No Photos Wound Measurements Length: (cm) 0.8 Width: (cm) 1 Depth: (cm) 0.2 Area: (cm) 0.628 Volume: (cm) 0.126 % Reduction in Area: 46.7% % Reduction in Volume: -6.8% Epithelialization: None Wound Description Classification: Category/Stage III Wound Margin: Distinct, outline  attached Exudate Amount: Small Exudate Type: Serous Exudate Color: amber Foul Odor After Cleansing: No Slough/Fibrino Yes Wound Bed Granulation Amount: Small (1-33%) Exposed Structure Granulation Quality: Red Fascia Exposed: No Necrotic Amount: Medium (34-66%) Fat Layer (Subcutaneous Tissue) Exposed: Yes Necrotic Quality: Adherent Slough, Bone Tendon Exposed: No Muscle Exposed: No Joint Exposed: No Bone Exposed: No Treatment Notes ANGELIAH, TABOR B ([id number]) [id number] Page 9 of 9 Wound #24 (Calcaneus) Wound Laterality: Left Cleanser Normal Saline Discharge Instruction: Wash your hands with soap and water. Remove old dressing, discard into plastic bag and place into trash. Cleanse the wound with Normal Saline prior to applying a clean dressing using gauze sponges, not tissues or cotton balls. Do not scrub or use excessive force. Pat dry using gauze sponges, not tissue or cotton balls. Soap and Water Discharge Instruction: Gently cleanse wound with antibacterial soap, rinse and pat dry prior to dressing wounds Peri-Wound Care Topical Primary Dressing Promogran Matrix 4.34 (in) Discharge Instruction: Moisten w/normal saline or sterile water; Cover wound as directed. Do not remove from wound bed. Secondary Dressing (BORDER) Zetuvit Plus SILICONE BORDER Dressing 4x4 (in/in) Discharge Instruction: Please do not put silicone bordered dressings under wraps. Use non-bordered dressing only. Secured With Compression Wrap Compression Stockings Facilities manager) Signed: 11/11/2022 5:02:51 PM By: Betha Loa Signed: 11/13/2022 1:24:46 PM By: Midge Aver MSN RN CNS WTA Entered By: Betha Loa on 11/10/2022 10:09:35 -------------------------------------------------------------------------------- Vitals Details Patient Name: Date of Service: [name], [place] 11/10/2022 12:45 PM Medical Record Number: [id number] Patient Account Number:  [id number] Date of Birth/Sex: Treating RN:  Aug 09, 1950 (72 y.o. [name] Primary Care Mickey Esguerra: [name] Other Clinician: Betha Loa Referring Poppi Scantling: Treating Tion Tse/Extender: Algis Greenhouse Weeks in Treatment: 5 Vital Signs Time Taken: 13:00 Temperature (F): 97.5 Height (in): 64 Pulse (bpm): 59 Weight (lbs): 250 Respiratory Rate (breaths/min): 18 Body Mass Index (BMI): 42.9 Blood Pressure (mmHg): 124/75 Reference Range: 80 - 120 mg / dl Electronic Signature(s) Signed: 11/11/2022 5:02:51 PM By: Betha Loa Entered By: Betha Loa on 11/10/2022 10:02:50

## 2022-11-16 ENCOUNTER — Inpatient Hospital Stay: Payer: Medicare HMO

## 2022-11-16 DIAGNOSIS — F419 Anxiety disorder, unspecified: Secondary | ICD-10-CM | POA: Diagnosis not present

## 2022-11-16 DIAGNOSIS — R41841 Cognitive communication deficit: Secondary | ICD-10-CM | POA: Diagnosis not present

## 2022-11-16 DIAGNOSIS — J4489 Other specified chronic obstructive pulmonary disease: Secondary | ICD-10-CM | POA: Diagnosis not present

## 2022-11-16 DIAGNOSIS — M797 Fibromyalgia: Secondary | ICD-10-CM | POA: Diagnosis not present

## 2022-11-16 DIAGNOSIS — N390 Urinary tract infection, site not specified: Secondary | ICD-10-CM | POA: Diagnosis not present

## 2022-11-16 DIAGNOSIS — I10 Essential (primary) hypertension: Secondary | ICD-10-CM | POA: Diagnosis not present

## 2022-11-16 DIAGNOSIS — I4891 Unspecified atrial fibrillation: Secondary | ICD-10-CM | POA: Diagnosis not present

## 2022-11-16 DIAGNOSIS — E1151 Type 2 diabetes mellitus with diabetic peripheral angiopathy without gangrene: Secondary | ICD-10-CM | POA: Diagnosis not present

## 2022-11-16 DIAGNOSIS — D509 Iron deficiency anemia, unspecified: Secondary | ICD-10-CM | POA: Diagnosis not present

## 2022-11-16 DIAGNOSIS — Z471 Aftercare following joint replacement surgery: Secondary | ICD-10-CM | POA: Diagnosis not present

## 2022-11-16 DIAGNOSIS — L89620 Pressure ulcer of left heel, unstageable: Secondary | ICD-10-CM | POA: Diagnosis not present

## 2022-11-16 DIAGNOSIS — F32A Depression, unspecified: Secondary | ICD-10-CM | POA: Diagnosis not present

## 2022-11-16 DIAGNOSIS — L89610 Pressure ulcer of right heel, unstageable: Secondary | ICD-10-CM | POA: Diagnosis not present

## 2022-11-17 ENCOUNTER — Encounter: Payer: Medicare HMO | Admitting: Internal Medicine

## 2022-11-17 ENCOUNTER — Other Ambulatory Visit: Payer: Self-pay | Admitting: Pulmonary Disease

## 2022-11-17 ENCOUNTER — Inpatient Hospital Stay: Payer: Medicare HMO | Attending: Oncology

## 2022-11-17 DIAGNOSIS — L89616 Pressure-induced deep tissue damage of right heel: Secondary | ICD-10-CM | POA: Diagnosis not present

## 2022-11-17 DIAGNOSIS — I10 Essential (primary) hypertension: Secondary | ICD-10-CM | POA: Diagnosis not present

## 2022-11-17 DIAGNOSIS — Z86718 Personal history of other venous thrombosis and embolism: Secondary | ICD-10-CM | POA: Diagnosis not present

## 2022-11-17 DIAGNOSIS — I872 Venous insufficiency (chronic) (peripheral): Secondary | ICD-10-CM | POA: Diagnosis not present

## 2022-11-17 DIAGNOSIS — E1140 Type 2 diabetes mellitus with diabetic neuropathy, unspecified: Secondary | ICD-10-CM | POA: Diagnosis not present

## 2022-11-17 DIAGNOSIS — I480 Paroxysmal atrial fibrillation: Secondary | ICD-10-CM | POA: Diagnosis not present

## 2022-11-17 DIAGNOSIS — D509 Iron deficiency anemia, unspecified: Secondary | ICD-10-CM | POA: Insufficient documentation

## 2022-11-17 DIAGNOSIS — I87323 Chronic venous hypertension (idiopathic) with inflammation of bilateral lower extremity: Secondary | ICD-10-CM | POA: Diagnosis not present

## 2022-11-17 DIAGNOSIS — E11621 Type 2 diabetes mellitus with foot ulcer: Secondary | ICD-10-CM | POA: Diagnosis not present

## 2022-11-17 DIAGNOSIS — L89623 Pressure ulcer of left heel, stage 3: Secondary | ICD-10-CM | POA: Diagnosis not present

## 2022-11-17 DIAGNOSIS — Z7901 Long term (current) use of anticoagulants: Secondary | ICD-10-CM | POA: Diagnosis not present

## 2022-11-17 LAB — FERRITIN: Ferritin: 39 ng/mL (ref 11–307)

## 2022-11-17 LAB — CBC
HCT: 49.2 % — ABNORMAL HIGH (ref 36.0–46.0)
Hemoglobin: 15.1 g/dL — ABNORMAL HIGH (ref 12.0–15.0)
MCH: 28.9 pg (ref 26.0–34.0)
MCHC: 30.7 g/dL (ref 30.0–36.0)
MCV: 94.1 fL (ref 80.0–100.0)
Platelets: 261 10*3/uL (ref 150–400)
RBC: 5.23 MIL/uL — ABNORMAL HIGH (ref 3.87–5.11)
RDW: 15.1 % (ref 11.5–15.5)
WBC: 6.1 10*3/uL (ref 4.0–10.5)
nRBC: 0 % (ref 0.0–0.2)

## 2022-11-17 LAB — IRON AND TIBC
Iron: 169 ug/dL (ref 28–170)
Saturation Ratios: 32 % — ABNORMAL HIGH (ref 10.4–31.8)
TIBC: 533 ug/dL — ABNORMAL HIGH (ref 250–450)
UIBC: 364 ug/dL

## 2022-11-20 NOTE — Progress Notes (Signed)
[name] ([id number]) [id number] Page 1 of 10 Visit Report for 11/17/2022 Debridement Details Patient Name: Date of Service: [name] 11/17/2022 1:30 PM Medical Record Number: [id number] Patient Account Number: [id number] Date of Birth/Sex: Treating RN: 1950-08-15 (72 y.o. [name] Primary Care Provider: [name] Other Clinician: Betha Loa Referring Provider: Treating Provider/Extender: Chauncey Mann, MICHA EL Ilean Skill Weeks in Treatment: 6 Debridement Performed for Assessment: Wound #24 Left Calcaneus Performed By: Physician Maxwell Caul, MD Debridement Type: Debridement Level of Consciousness (Pre-procedure): Awake and Alert Pre-procedure Verification/Time Out Yes - 14:19 Taken: Start Time: 14:19 Percent of Wound Bed Debrided: 100% T Area Debrided (cm): otal 0.75 Tissue and other material debrided: Viable, Non-Viable, Subcutaneous Level: Skin/Subcutaneous Tissue Debridement Description: Excisional Instrument: Curette Bleeding: Minimum Hemostasis Achieved: Pressure Response to Treatment: Procedure was tolerated well Level of Consciousness (Post- Awake and Alert procedure): Post Debridement Measurements of Total Wound Length: (cm) 0.8 Stage: Category/Stage III Width: (cm) 1.2 Depth: (cm) 0.2 Volume: (cm) 0.151 Character of Wound/Ulcer Post Debridement: Stable Post Procedure Diagnosis Same as Pre-procedure Electronic Signature(s) Signed: 11/17/2022 5:06:52 PM By: Baltazar Najjar MD Signed: 11/18/2022 4:41:54 PM By: Betha Loa Signed: 11/20/2022 11:58:03 AM By: Yevonne Pax RN Entered By: Betha Loa on 11/17/2022 11:20:14 -------------------------------------------------------------------------------- HPI Details Patient Name: Date of Service: [name], [name]. 11/17/2022 1:30 PM [name] ([id number]) [id number] Page 2 of 10 Medical Record Number:  [id number] Patient Account Number: [id number] Date of Birth/Sex: Treating RN: 1950/08/29 (72 y.o. [name] Primary Care Provider: [name] Other Clinician: Betha Loa Referring Provider: Treating Provider/Extender: Chauncey Mann, MICHA EL Ilean Skill Weeks in Treatment: 6 History of Present Illness HPI Description: 06/28/20 upon evaluation today patient appears to be doing unfortunately somewhat poorly in regard to wounds that she has over her lower extremities. She has a wound on the left distal second toe, left posterior heel, and right anterior lower leg. With that being said all in all none of the wounds appear to be too bad the one that hurts her the most is actually the wound on the posterior heel which actually is also one of the smallest. Nonetheless I think there is some callus hiding what is really going on in this area. Fortunately there does not appear to be any obvious evidence of infection which is great news. Patient does have a history of diabetes mellitus type 2, chronic venous insufficiency, hypertension, history of DVT and is on long-term anticoagulant s, therapy, Eliquis. 07/05/20 upon evaluation today patient appears to be doing well currently with regard to her wounds. I feel like she is making progress which is great news and overall there is no signs of active infection at this time. No fevers, chills, nausea, vomiting, or diarrhea. 4/29; patient has a only a small area remaining on the tip of her left second toe and a small area remaining on the left heel. The area on the right anterior lower leg is healed. They tell me that she had remote podiatric surgery on the toes of the left foot however they are very fixed in position and I wonder whether they are making it difficult to relieve pressure in her foot wear. She is a type II diabetic with neuropathy as well. 08/01/2020 upon evaluation today patient appears to be doing well currently in regard to her wounds. In  fact everything appears to be doing much better. The posterior aspect of her heel is actually giving her  some trouble not the Achilles area but a small wound that we did not even have marked. In fact I had noted this before. Nonetheless we are going to addressed that today. 5/27; most of the patient's wounds are healed including the left Achilles heel left toe. Right forearm is also healed. She has a new abrasion posteriorly in the right elbow area just above the olecranon this happened today with an abrasion from her car door 6/15; the patient's wounds on the left Achilles and left second toe remain healed. The area on the right forearm is also just about healed in fact the skin I replaced last time is looking viable which is good. She had me look at the left heel again which is not in the area of the wound more towards the tip of the heel at the Achilles insertion site as well as the tip of the left foot second toe. In the Achilles area that is clearly is friction probably in bed at night. Not really near where the wound was. The left second toe remains healed although this is hammered and overhanging first toe also puts pressure in this area 09/13/2020 upon evaluation today patient appears to be doing well with regard to all previous wounds that she had. Everything is completely healed. With that being said I do think that the patient unfortunately has a new skin tear on the right forearm which is due to her put a Band-Aid on it and then when it pulled off this caused some issues with skin tearing. Nonetheless I think that this needs to be addressed today. Hopefully will take too long to get this to heal. She is having some issues with pain in her heel but I think this is more related to be honest to her neuropathy as opposed to anything else. Readmission: 10/30/2020 upon evaluation today patient appears to be doing poorly in regard to her legs bilaterally. She is also having an issue with her fourth  toe on the left. Fortunately there does not appear to be any signs of active infection at this time. No fevers, chills, nausea, vomiting, or diarrhea. It is actually been quite a while since we have seen her. In fact June 24 was the last time that she was here in the clinic. She tells me she did not come back because things were just doing "pretty well". 11/29/2020 upon evaluation today patient actually appears to be doing well with regard to her arms those are completely healed. Her right leg is still open but doing okay. The fourth toe of the left foot does show some signs here of having some issues with necrotic tissue of an open wound. This is where a toenail was removed by podiatry. Subsequently I do think that we need to go ahead and see what we can do about getting this cleared away so being try to get some healing going forward. 12/13/2020 upon evaluation today patient appears to be doing decently well in regard to her wound on the right leg. In general I think that she is making good progress here. Unfortunately she continues to have significant issues with swelling in the left leg is now even more swollen at this point. For that reason I do believe she would be benefited by wrapping both sides although she is not interested in doing this. Overall I think that she is definitely making some good progress here nonetheless in regard to the right leg left leg leave some to be desired. 12/27/2020 upon  evaluation today patient appears to be doing well with regard to her wound in regard to the legs. Unfortunately the toe was not doing nearly as well. I am much more concerned about infection and osteomyelitis here. She had the MRI but right now were not seeing obvious evidence of redness spreading up to her foot this is good news I do not have the result of the MRI as of yet. I am good to place her on doxycycline however due to the fact that the wound does seem to be getting a little worse in regard to  her toe. 01/14/2021 upon evaluation today patient appears to be doing well with regard to her ulcers. The right leg is doing well and even the toe seems to be doing well though she still has some evidence of bone exposed on the toe which does not appear to be doing quite as good as I would like to see just and the fact that is still not covered over new tissue but looks tremendously better compared to 2 weeks ago. Overall very pleased in that regard. 01/28/2021 upon evaluation today patient's leg on the left actually appears to have new wounds this is due to her deciding to shave yesterday. Fortunately there does not appear to be any signs of infection currently. This is good news. Nonetheless I do believe that with these new areas open it would be best to wrap her leg to try to make sure we get this under control sooner rather than later. With regard to her toe there still does appear to be some evidence of bone exposed this was covered over with callus I did remove that today. Nonetheless I think this is significantly smaller than previous. 02/11/2021 upon evaluation today patient appears to be doing okay in regard to her leg ulcers. I do not see anything obvious at this point this seems to be a complicating factor. I think that she is getting better. Were wrap of the right leg not the left leg. With that being said the left fourth toe ulcer still does have evidence of being open there is some drainage still on top of the wound I think we can to try to see about using something like a collagen dressing today to see if that would be of benefit for her. She is in agreement with giving this a trial. Nonetheless I am concerned about the fact that she continues to have issues here with this not healing it is confirmed chronic osteomyelitis she has been on antibiotics. I am going to extend those today as well. That is Keflex and that will be for 1 additional month. Nonetheless if she is not doing  significantly better by the next time I see her I think that the biggest thing is going to be for [place] to go ahead and see what we can do about getting her started with hyperbaric oxygen therapy to try to save the toe. 02/20/2021 upon evaluation today patient presents for follow-up she actually came in a little bit early as the home health nurse was concerned about her toe becoming "gangrenous". With that being said the good news is there is [place] no evidence of this and in fact the toe seems to be doing better at this point. What was over the toe was an area that had bled significantly and then subsequently dried making it look much worse than where things actually stand. There is just a very small opening remaining in the toe. In regard to  her legs everything appears to be healed bilaterally. 02/24/2021 upon evaluation today patient appears to be doing well with regard to her toe ulcer. I am actually very pleased with where we stand and I think that she is making good progress at this time. There is still a very small opening although I think the biggest issue is the collagen seems to be getting stuck and drying up because the wound is so small we probably just need to use something to keep this moist like a little piece of Xeroform gauze and see if we get this to close. 03/07/2021 upon evaluation patient actually appears to be making excellent progress in regard to her last wound on the toe. There is just a very tiny potential area that I can even be sure at first until I get some of the dry skin off when I did get this removed there was just a very tiny pinpoint opening that was barely even moist. Actually feel like this is very close to complete resolution. She has been using the Xeroform that is doing great. [name], [name] ([id number]) [id number] Page 3 of 10 03/25/2021 upon evaluation today patient appears to be doing well with regard to her toe ulcer. In fact this  appears to be potentially completely healed. Fortunately I do not see anything draining at this point which is great news. She does have a small blood blister that is already drying up and looking okay on the third toe as well I am not sure what happened here she has an either. Otherwise she is having some weeping from the right leg this again is newer she tells me from last week weighing in that her pulmonologist and then today weighing in at the hematologist that she gained 30 pounds. This is quite significant if indeed true and I think she is to contact her primary care provider ASAP. This would account for why her right leg is weeping as well. 04/04/2020 upon evaluation today patient appears to be doing excellent in regard to her toe which I actually think is healed today. With that being said she unfortunately is having issues with her leg swelling and again several blisters on each leg that have reopened unfortunately. 05/02/2021 upon evaluation today patient appears to be doing well with regard to her wounds. In fact is down just 1 on the anterior portion of her right leg which is actually from a blister. Otherwise she is doing awesome. 05/15/2021 upon evaluation today patient appears to be doing better in regard to her leg ulcer although she has a small irritated area from tape on the leg medially. She also has a skin tear to her right elbow region. Fortunately there does not appear to be any signs of active infection locally nor systemically at this point which is great news. No fevers, chills, nausea, vomiting, or diarrhea. 05/26/2021 upon evaluation today patient appears to be doing better in regard to the actual wounds although she is having a lot of leaking from the right leg. She is also been using her lymphedema pumps along with wearing her compression. Nonetheless she is having a lot of trouble with her breathing I am concerned that the pumps may be contributing to this. 06/12/2021 upon  evaluation today patient appears to be doing better in regard to a lot of things including her breathing as well as her legs. She still has a lot of swelling but the good news is this is better she lost about 20 pounds of  fluid when she went to the hospital and they diuresed her. Subsequently this is good news. Nonetheless she actually told me that "if she ever gets that bad again for me to yell at her to go to the hospital". She said that I was not quite forceful enough and telling her what she needed to do. She also notes she will try to be more receptive in the future. 06/19/2021 upon evaluation today patient appears to be doing well with regard to her wound. She is tolerating the dressing changes without complication. Fortunately her legs on the right are completely healed. She did have an area that she hit her Achilles on the left but this too seems to be pretty much closed at this point. Readmission: 02-05-2022 upon evaluation today patient appears to be doing well currently in regard to her left leg although her right leg is actually giving her quite a bit more trouble at this point. Fortunately there does not appear to be any signs of infection locally or systemically which is great news. No fevers, chills, nausea, vomiting, or diarrhea. Patient tells me that she began having issues more recently with her legs in the right leg is definitely worse than the left as far as the way she feels as well and she states it was around September 16 that she first noted this and then when she called it took about 2-1/2 weeks to get in to see me. Her past medical history really has not changed since I saw her back in March everything is pretty much about the same repeat ABIs appear to be doing well currently at 1.1 on the left and 1.12 on the right. 02-10-2022 upon evaluation today patient actually appears to be doing well. Culture actually showed evidence of Pseudomonas but her legs look amazing today I do  not see any signs of infection I do not believe that we need to add anything especially in light of the fact there is a lot of interactions with anything to treat the Pseudomonas in her current medication regimen. For that reason we will just hold with where we stand and continue with the current therapy. 02-24-2022 upon evaluation today patient appears to be doing excellent in fact she appears to be completely healed based on what we are seeing. Readmission: 10-01-2022 upon evaluation today patient presents for follow-up evaluation here in the clinic she has not been seen since December 2023. At that point she was actually being seen for her legs that is actually doing much better which is great news. Overall very pleased with her leg situation and she has been doing great with her compression socks. With that being said unfortunately she does seem to be having issues with her heel this was secondary to having her left shoulder replacement and subsequent to that she was in the hospital for 6 days during that time she developed a pressure wound on the left heel. Unfortunately that is now it is giving her the most trouble she also has some irritation of the right heel but is not nearly as significant as far as that is concerned there is no signs of active infection at this time which is great news and very pleased in that regard. 10-09-2022 upon evaluation today patient appears to be doing well currently in regard to her heel we are definitely seeing signs of improvement which is great news and in general I think that we are moving in the right direction. I do not see any evidence of  worsening overall which is great news. 7/25; pressure ulcer at the tip of her heel she has been using Promogran and sit to foot with a border foam. She is a type II diabetic. She is attempting to religiously offload this area with Prevalon boots at night open heeled shoes during the day. Her measurements are a lot better today  per our intake nurse 11-03-2022 upon evaluation today patient actually has not been there for 3 weeks is a combination of a couple missed appointments on her end and last week she had to cancel because of weather. Either way her foot seems to be doing well in regard to the heel on the left lateral area. This is not showing signs of any obvious infection and at this point I think that she is making headway towards getting this closed although I do believe there is quite a bit of slough and biofilm skin need to be cleaned off just a little bit of callus around the edges of the wound. 11-10-2022 upon evaluation today patient appears to be doing well currently in regard to her heel ulcer. She has been tolerating the dressing changes without complication and in general I feel like she is making excellent headway towards complete closure. 8/27; patient has a wound on the left heel near the tip. Using Promogran and Zituvimet. Comes in with open heeled shoes however it has a lip that encompasses the wound area which is on the distal part of the Achilles area. Electronic Signature(s) Signed: 11/17/2022 5:06:52 PM By: Baltazar Najjar MD Entered By: Baltazar Najjar on 11/17/2022 11:29:21 [name] ([id number]) [id number] Page 4 of 10 -------------------------------------------------------------------------------- Physical Exam Details Patient Name: Date of Service: [name] 11/17/2022 1:30 PM Medical Record Number: [id number] Patient Account Number: [id number] Date of Birth/Sex: Treating RN: December 16, 1950 (72 y.o. [name] Primary Care Provider: [name] Other Clinician: Betha Loa Referring Provider: Treating Provider/Extender: Chauncey Mann, MICHA EL Ilean Skill Weeks in Treatment: 6 Constitutional Sitting or standing Blood Pressure is within target range for patient.. Pulse regular and within target range for patient.Marland Kitchen Respirations regular,  non-labored and within target range.. Temperature is normal and within the target range for the patient.Marland Kitchen appears in no distress. Notes Wound exam; wound near the tip of the left heel. 100% slough colored surface. I used a #3 curette to clean this out it does quite nicely with healthy granulation albeit with some depth.We removed slough and some subcutaneous tissue. Hemostasis with direct pressure There is no evidence of infection around this area. Electronic Signature(s) Signed: 11/17/2022 5:06:52 PM By: Baltazar Najjar MD Entered By: Baltazar Najjar on 11/17/2022 11:33:13 -------------------------------------------------------------------------------- Physician Orders Details Patient Name: Date of Service: [name], [place] 11/17/2022 1:30 PM Medical Record Number: [id number] Patient Account Number: [id number] Date of Birth/Sex: Treating RN: 1950/04/23 (72 y.o. [name] Primary Care Provider: [name] Other Clinician: Betha Loa Referring Provider: Treating Provider/Extender: Chauncey Mann, MICHA EL Ilean Skill Weeks in Treatment: 6 Verbal / Phone Orders: Yes Clinician: Yevonne Pax Read Back and Verified: Yes Diagnosis Coding Follow-up Appointments Return Appointment in 1 week. Home Health [facility] for wound care. May utilize formulary equivalent dressing for wound treatment orders unless otherwise specified. Home Health Nurse may visit PRN to address patients wound care needs. Rolene Arbour for wound care to left calcan [phone] Anesthetic (Use 'Patient Medications' Section for Anesthetic Order Entry) Lidocaine applied to wound bed Edema Control - Lymphedema / Segmental  Compressive Device / Other Elevate, Exercise Daily and A void Standing for Long Periods of Time. Elevate legs to the level of the heart and pump ankles as often as possible Elevate leg(s) parallel to the floor when sitting. Wound Treatment Wound #24 - Calcaneus Wound Laterality:  Left NESHIA, ELSASS [name] ([id number]) [id number] Page 5 of 10 Cleanser: Normal Saline 1 x Per Day/30 Days Discharge Instructions: Wash your hands with soap and water. Remove old dressing, discard into plastic bag and place into trash. Cleanse the wound with Normal Saline prior to applying a clean dressing using gauze sponges, not tissues or cotton balls. Do not scrub or use excessive force. Pat dry using gauze sponges, not tissue or cotton balls. Cleanser: Soap and Water 1 x Per Day/30 Days Discharge Instructions: Gently cleanse wound with antibacterial soap, rinse and pat dry prior to dressing wounds Prim Dressing: Promogran Matrix 4.34 (in) 1 x Per Day/30 Days ary Discharge Instructions: Moisten w/normal saline or sterile water; Cover wound as directed. Do not remove from wound bed. Secondary Dressing: (BORDER) Zetuvit Plus SILICONE BORDER Dressing 4x4 (in/in) 1 x Per Day/30 Days Discharge Instructions: Please do not put silicone bordered dressings under wraps. Use non-bordered dressing only. Electronic Signature(s) Signed: 11/17/2022 5:06:52 PM By: Baltazar Najjar MD Signed: 11/18/2022 4:41:54 PM By: Betha Loa Entered By: Betha Loa on 11/17/2022 11:20:33 -------------------------------------------------------------------------------- Problem List Details Patient Name: Date of Service: Dawn Yolanda Manges, [name]. 11/17/2022 1:30 PM Medical Record Number: [id number] Patient Account Number: [id number] Date of Birth/Sex: Treating RN: 29-May-1950 (72 y.o. [name] Primary Care Provider: [name] Other Clinician: Betha Loa Referring Provider: Treating Provider/Extender: Chauncey Mann, MICHA EL Ilean Skill Weeks in Treatment: 6 Active Problems ICD-10 Encounter Code Description Active Date MDM Diagnosis E11.621 Type 2 diabetes mellitus with foot ulcer 10/01/2022 No Yes I87.323 Chronic venous hypertension (idiopathic) with inflammation of bilateral  lower 10/01/2022 No Yes extremity L89.623 Pressure ulcer of left heel, stage 3 10/01/2022 No Yes L89.616 Pressure-induced deep tissue damage of right heel 10/01/2022 No Yes Z79.01 Long term (current) use of anticoagulants 10/01/2022 No Yes I48.0 Paroxysmal atrial fibrillation 10/01/2022 No Yes Inactive Problems TRENIYA, SURITA [name] ([id number]) [phone].pdf Page 6 of 10 Resolved Problems Electronic Signature(s) Signed: 11/17/2022 5:06:52 PM By: Baltazar Najjar MD Entered By: Baltazar Najjar on 11/17/2022 11:27:08 -------------------------------------------------------------------------------- Progress Note Details Patient Name: Date of Service: [name], Dawn Cruz [name]. 11/17/2022 1:30 PM Medical Record Number: [id number] Patient Account Number: [id number] Date of Birth/Sex: Treating RN: 09-01-1950 (72 y.o. [name] Primary Care Provider: [name] Other Clinician: Betha Loa Referring Provider: Treating Provider/Extender: Chauncey Mann, MICHA EL Ilean Skill Weeks in Treatment: 6 Subjective History of Present Illness (HPI) 06/28/20 upon evaluation today patient appears to be doing unfortunately somewhat poorly in regard to wounds that she has over her lower extremities. She has a wound on the left distal second toe, left posterior heel, and right anterior lower leg. With that being said all in all none of the wounds appear to be too bad the one that hurts her the most is actually the wound on the posterior heel which actually is also one of the smallest. Nonetheless I think there is some callus hiding what is really going on in this area. Fortunately there does not appear to be any obvious evidence of infection which is great news. Patient does have a history of diabetes mellitus type 2, chronic venous insufficiency, hypertension, history of DVT and is on long-term anticoagulant therapy,  Eliquis. s, 07/05/20 upon evaluation today patient appears to be doing well  currently with regard to her wounds. I feel like she is making progress which is great news and overall there is no signs of active infection at this time. No fevers, chills, nausea, vomiting, or diarrhea. 4/29; patient has a only a small area remaining on the tip of her left second toe and a small area remaining on the left heel. The area on the right anterior lower leg is healed. They tell me that she had remote podiatric surgery on the toes of the left foot however they are very fixed in position and I wonder whether they are making it difficult to relieve pressure in her foot wear. She is a type II diabetic with neuropathy as well. 08/01/2020 upon evaluation today patient appears to be doing well currently in regard to her wounds. In fact everything appears to be doing much better. The posterior aspect of her heel is actually giving her some trouble not the Achilles area but a small wound that we did not even have marked. In fact I had noted this before. Nonetheless we are going to addressed that today. 5/27; most of the patient's wounds are healed including the left Achilles heel left toe. Right forearm is also healed. She has a new abrasion posteriorly in the right elbow area just above the olecranon this happened today with an abrasion from her car door 6/15; the patient's wounds on the left Achilles and left second toe remain healed. The area on the right forearm is also just about healed in fact the skin I replaced last time is looking viable which is good. She had me look at the left heel again which is not in the area of the wound more towards the tip of the heel at the Achilles insertion site as well as the tip of the left foot second toe. In the Achilles area that is clearly is friction probably in bed at night. Not really near where the wound was. The left second toe remains healed although this is hammered and overhanging first toe also puts pressure in this area 09/13/2020 upon evaluation  today patient appears to be doing well with regard to all previous wounds that she had. Everything is completely healed. With that being said I do think that the patient unfortunately has a new skin tear on the right forearm which is due to her put a Band-Aid on it and then when it pulled off this caused some issues with skin tearing. Nonetheless I think that this needs to be addressed today. Hopefully will take too long to get this to heal. She is having some issues with pain in her heel but I think this is more related to be honest to her neuropathy as opposed to anything else. Readmission: 10/30/2020 upon evaluation today patient appears to be doing poorly in regard to her legs bilaterally. She is also having an issue with her fourth toe on the left. Fortunately there does not appear to be any signs of active infection at this time. No fevers, chills, nausea, vomiting, or diarrhea. It is actually been quite a while since we have seen her. In fact June 24 was the last time that she was here in the clinic. She tells me she did not come back because things were just doing "pretty well". 11/29/2020 upon evaluation today patient actually appears to be doing well with regard to her arms those are completely healed. Her right leg is  still open but doing okay. The fourth toe of the left foot does show some signs here of having some issues with necrotic tissue of an open wound. This is where a toenail was removed by podiatry. Subsequently I do think that we need to go ahead and see what we can do about getting this cleared away so being try to get some healing going forward. 12/13/2020 upon evaluation today patient appears to be doing decently well in regard to her wound on the right leg. In general I think that she is making good progress here. Unfortunately she continues to have significant issues with swelling in the left leg is now even more swollen at this point. For that reason I do believe she would be  benefited by wrapping both sides although she is not interested in doing this. Overall I think that she is definitely making some good progress here nonetheless in regard to the right leg left leg leave some to be desired. 12/27/2020 upon evaluation today patient appears to be doing well with regard to her wound in regard to the legs. Unfortunately the toe was not doing nearly as well. I am much more concerned about infection and osteomyelitis here. She had the MRI but right now were not seeing obvious evidence of redness spreading up to her foot this is good news I do not have the result of the MRI as of yet. I am good to place her on doxycycline however due to the fact that the wound does seem to be getting a little worse in regard to her toe. 01/14/2021 upon evaluation today patient appears to be doing well with regard to her ulcers. The right leg is doing well and even the toe seems to be doing well though she still has some evidence of bone exposed on the toe which does not appear to be doing quite as good as I would like to see just and the fact that is [name], [name] ([id number]) [id number] Page 7 of 10 still not covered over new tissue but looks tremendously better compared to 2 weeks ago. Overall very pleased in that regard. 01/28/2021 upon evaluation today patient's leg on the left actually appears to have new wounds this is due to her deciding to shave yesterday. Fortunately there does not appear to be any signs of infection currently. This is good news. Nonetheless I do believe that with these new areas open it would be best to wrap her leg to try to make sure we get this under control sooner rather than later. With regard to her toe there still does appear to be some evidence of bone exposed this was covered over with callus I did remove that today. Nonetheless I think this is significantly smaller than previous. 02/11/2021 upon evaluation today patient appears  to be doing okay in regard to her leg ulcers. I do not see anything obvious at this point this seems to be a complicating factor. I think that she is getting better. Were wrap of the right leg not the left leg. With that being said the left fourth toe ulcer still does have evidence of being open there is some drainage still on top of the wound I think we can to try to see about using something like a collagen dressing today to see if that would be of benefit for her. She is in agreement with giving this a trial. Nonetheless I am concerned about the fact that she continues to have issues here with this  not healing it is confirmed chronic osteomyelitis she has been on antibiotics. I am going to extend those today as well. That is Keflex and that will be for 1 additional month. Nonetheless if she is not doing significantly better by the next time I see her I think that the biggest thing is going to be for [place] to go ahead and see what we can do about getting her started with hyperbaric oxygen therapy to try to save the toe. 02/20/2021 upon evaluation today patient presents for follow-up she actually came in a little bit early as the home health nurse was concerned about her toe becoming "gangrenous". With that being said the good news is there is [place] no evidence of this and in fact the toe seems to be doing better at this point. What was over the toe was an area that had bled significantly and then subsequently dried making it look much worse than where things actually stand. There is just a very small opening remaining in the toe. In regard to her legs everything appears to be healed bilaterally. 02/24/2021 upon evaluation today patient appears to be doing well with regard to her toe ulcer. I am actually very pleased with where we stand and I think that she is making good progress at this time. There is still a very small opening although I think the biggest issue is the collagen seems to be getting stuck  and drying up because the wound is so small we probably just need to use something to keep this moist like a little piece of Xeroform gauze and see if we get this to close. 03/07/2021 upon evaluation patient actually appears to be making excellent progress in regard to her last wound on the toe. There is just a very tiny potential area that I can even be sure at first until I get some of the dry skin off when I did get this removed there was just a very tiny pinpoint opening that was barely even moist. Actually feel like this is very close to complete resolution. She has been using the Xeroform that is doing great. 03/25/2021 upon evaluation today patient appears to be doing well with regard to her toe ulcer. In fact this appears to be potentially completely healed. Fortunately I do not see anything draining at this point which is great news. She does have a small blood blister that is already drying up and looking okay on the third toe as well I am not sure what happened here she has an either. Otherwise she is having some weeping from the right leg this again is newer she tells me from last week weighing in that her pulmonologist and then today weighing in at the hematologist that she gained 30 pounds. This is quite significant if indeed true and I think she is to contact her primary care provider ASAP. This would account for why her right leg is weeping as well. 04/04/2020 upon evaluation today patient appears to be doing excellent in regard to her toe which I actually think is healed today. With that being said she unfortunately is having issues with her leg swelling and again several blisters on each leg that have reopened unfortunately. 05/02/2021 upon evaluation today patient appears to be doing well with regard to her wounds. In fact is down just 1 on the anterior portion of her right leg which is actually from a blister. Otherwise she is doing awesome. 05/15/2021 upon evaluation today patient  appears to be doing  better in regard to her leg ulcer although she has a small irritated area from tape on the leg medially. She also has a skin tear to her right elbow region. Fortunately there does not appear to be any signs of active infection locally nor systemically at this point which is great news. No fevers, chills, nausea, vomiting, or diarrhea. 05/26/2021 upon evaluation today patient appears to be doing better in regard to the actual wounds although she is having a lot of leaking from the right leg. She is also been using her lymphedema pumps along with wearing her compression. Nonetheless she is having a lot of trouble with her breathing I am concerned that the pumps may be contributing to this. 06/12/2021 upon evaluation today patient appears to be doing better in regard to a lot of things including her breathing as well as her legs. She still has a lot of swelling but the good news is this is better she lost about 20 pounds of fluid when she went to the hospital and they diuresed her. Subsequently this is good news. Nonetheless she actually told me that "if she ever gets that bad again for me to yell at her to go to the hospital". She said that I was not quite forceful enough and telling her what she needed to do. She also notes she will try to be more receptive in the future. 06/19/2021 upon evaluation today patient appears to be doing well with regard to her wound. She is tolerating the dressing changes without complication. Fortunately her legs on the right are completely healed. She did have an area that she hit her Achilles on the left but this too seems to be pretty much closed at this point. Readmission: 02-05-2022 upon evaluation today patient appears to be doing well currently in regard to her left leg although her right leg is actually giving her quite a bit more trouble at this point. Fortunately there does not appear to be any signs of infection locally or systemically which is  great news. No fevers, chills, nausea, vomiting, or diarrhea. Patient tells me that she began having issues more recently with her legs in the right leg is definitely worse than the left as far as the way she feels as well and she states it was around September 16 that she first noted this and then when she called it took about 2-1/2 weeks to get in to see me. Her past medical history really has not changed since I saw her back in March everything is pretty much about the same repeat ABIs appear to be doing well currently at 1.1 on the left and 1.12 on the right. 02-10-2022 upon evaluation today patient actually appears to be doing well. Culture actually showed evidence of Pseudomonas but her legs look amazing today I do not see any signs of infection I do not believe that we need to add anything especially in light of the fact there is a lot of interactions with anything to treat the Pseudomonas in her current medication regimen. For that reason we will just hold with where we stand and continue with the current therapy. 02-24-2022 upon evaluation today patient appears to be doing excellent in fact she appears to be completely healed based on what we are seeing. Readmission: 10-01-2022 upon evaluation today patient presents for follow-up evaluation here in the clinic she has not been seen since December 2023. At that point she was actually being seen for her legs that is actually doing much  better which is great news. Overall very pleased with her leg situation and she has been doing great with her compression socks. With that being said unfortunately she does seem to be having issues with her heel this was secondary to having her left shoulder replacement and subsequent to that she was in the hospital for 6 days during that time she developed a pressure wound on the left heel. Unfortunately that is now it is giving her the most trouble she also has some irritation of the right heel but is not nearly  as significant as far as that is concerned there is no signs of active infection at this time which is great news and very pleased in that regard. 10-09-2022 upon evaluation today patient appears to be doing well currently in regard to her heel we are definitely seeing signs of improvement which is great news and in general I think that we are moving in the right direction. I do not see any evidence of worsening overall which is great news. 7/25; pressure ulcer at the tip of her heel she has been using Promogran and sit to foot with a border foam. She is a type II diabetic. She is attempting to religiously offload this area with Prevalon boots at night open heeled shoes during the day. Her measurements are a lot better today per our intake nurse 11-03-2022 upon evaluation today patient actually has not been there for 3 weeks is a combination of a couple missed appointments on her end and last week she had to cancel because of weather. Either way her foot seems to be doing well in regard to the heel on the left lateral area. This is not showing signs of any [name], [name] ([id number]) [id number] Page 8 of 10 obvious infection and at this point I think that she is making headway towards getting this closed although I do believe there is quite a bit of slough and biofilm skin need to be cleaned off just a little bit of callus around the edges of the wound. 11-10-2022 upon evaluation today patient appears to be doing well currently in regard to her heel ulcer. She has been tolerating the dressing changes without complication and in general I feel like she is making excellent headway towards complete closure. 8/27; patient has a wound on the left heel near the tip. Using Promogran and Zituvimet. Comes in with open heeled shoes however it has a lip that encompasses the wound area which is on the distal part of the Achilles area. Objective Constitutional Sitting or standing Blood  Pressure is within target range for patient.. Pulse regular and within target range for patient.Marland Kitchen Respirations regular, non-labored and within target range.. Temperature is normal and within the target range for the patient.Marland Kitchen appears in no distress. Vitals Time Taken: 1:57 PM, Height: 64 in, Weight: 250 lbs, BMI: 42.9, Temperature: 97.9 F, Pulse: 74 bpm, Respiratory Rate: 18 breaths/min, Blood Pressure: 104/70 mmHg. General Notes: Wound exam; wound near the tip of the left heel. 100% slough colored surface. I used a #3 curette to clean this out it does quite nicely with healthy granulation albeit with some depth.We removed slough and some subcutaneous tissue. Hemostasis with direct pressure There is no evidence of infection around this area. Integumentary (Hair, Skin) Wound #24 status is Open. Original cause of wound was Gradually Appeared. The date acquired was: 08/22/2022. The wound has been in treatment 6 weeks. The wound is located on the Left Calcaneus. The wound measures 0.8cm length  x 1.2cm width x 0.2cm depth; 0.754cm^2 area and 0.151cm^3 volume. There is Fat Layer (Subcutaneous Tissue) exposed. There is a small amount of serous drainage noted. The wound margin is distinct with the outline attached to the wound base. There is small (1-33%) red granulation within the wound bed. There is a medium (34-66%) amount of necrotic tissue within the wound bed including Adherent Slough and Necrosis of Bone. Assessment Active Problems ICD-10 Type 2 diabetes mellitus with foot ulcer Chronic venous hypertension (idiopathic) with inflammation of bilateral lower extremity Pressure ulcer of left heel, stage 3 Pressure-induced deep tissue damage of right heel Long term (current) use of anticoagulants Paroxysmal atrial fibrillation Procedures Wound #24 Pre-procedure diagnosis of Wound #24 is a Pressure Ulcer located on the Left Calcaneus . There was a Excisional Skin/Subcutaneous Tissue Debridement  with a total area of 0.75 sq cm performed by Maxwell Caul, MD. With the following instrument(s): Curette to remove Viable and Non-Viable tissue/material. Material removed includes Subcutaneous Tissue. A time out was conducted at 14:19, prior to the start of the procedure. A Minimum amount of bleeding was controlled with Pressure. The procedure was tolerated well. Post Debridement Measurements: 0.8cm length x 1.2cm width x 0.2cm depth; 0.151cm^3 volume. Post debridement Stage noted as Category/Stage III. Character of Wound/Ulcer Post Debridement is stable. Post procedure Diagnosis Wound #24: Same as Pre-Procedure Plan Follow-up Appointments: Return Appointment in 1 week. Home Health: [facility] for wound care. May utilize formulary equivalent dressing for wound treatment orders unless otherwise specified. Home Health Nurse may visit PRN to address patients wound care needs. Rolene Arbour for wound care to left calcan [phone] Anesthetic (Use 'Patient Medications' Section for Anesthetic Order Entry): Lidocaine applied to wound bed Edema Control - Lymphedema / Segmental Compressive Device / Other: Elevate, Exercise Daily and Avoid Standing for Long Periods of Time. Elevate legs to the level of the heart and pump ankles as often as possible Elevate leg(s) parallel to the floor when sitting. [name], [name] ([id number]) [id number] Page 9 of 10 WOUND #24: - Calcaneus Wound Laterality: Left Cleanser: Normal Saline 1 x Per Day/30 Days Discharge Instructions: Wash your hands with soap and water. Remove old dressing, discard into plastic bag and place into trash. Cleanse the wound with Normal Saline prior to applying a clean dressing using gauze sponges, not tissues or cotton balls. Do not scrub or use excessive force. Pat dry using gauze sponges, not tissue or cotton balls. Cleanser: Soap and Water 1 x Per Day/30 Days Discharge Instructions: Gently  cleanse wound with antibacterial soap, rinse and pat dry prior to dressing wounds Prim Dressing: Promogran Matrix 4.34 (in) 1 x Per Day/30 Days ary Discharge Instructions: Moisten w/normal saline or sterile water; Cover wound as directed. Do not remove from wound bed. Secondary Dressing: (BORDER) Zetuvit Plus SILICONE BORDER Dressing 4x4 (in/in) 1 x Per Day/30 Days Discharge Instructions: Please do not put silicone bordered dressings under wraps. Use non-bordered dressing only. 1. Postdebridement the wound bed cleaned up quite nicely. I think we can continue with the Promogran and sit to vet. 2. I have asked her to get a pair of shoes to wear that is completely open heel and should alleviate pressure on this area when she is in her shoes 3. She has bunny boots at night which should provide adequate offloading Electronic Signature(s) Signed: 11/17/2022 2:33:48 PM By: Baltazar Najjar MD Entered By: Baltazar Najjar on 11/17/2022 11:33:47 -------------------------------------------------------------------------------- SuperBill Details Patient Name: Date of Service: [name], Dawn NCY  Leonard Schwartz 11/17/2022 Medical Record Number: [id number] Patient Account Number: [id number] Date of Birth/Sex: Treating RN: [birth year] (72 y.o. [name] Primary Care Provider: [name] Other Clinician: Betha Loa Referring Provider: Treating Provider/Extender: Chauncey Mann, MICHA EL Ilean Skill Weeks in Treatment: 6 Diagnosis Coding ICD-10 Codes Code Description E11.621 Type 2 diabetes mellitus with foot ulcer I87.323 Chronic venous hypertension (idiopathic) with inflammation of bilateral lower extremity L89.623 Pressure ulcer of left heel, stage 3 L89.616 Pressure-induced deep tissue damage of right heel Z79.01 Long term (current) use of anticoagulants I48.0 Paroxysmal atrial fibrillation Facility Procedures : CPT4 Code: [id number] Description: 11042 - DEB SUBQ TISSUE 20 SQ CM/< ICD-10 Diagnosis  Description L89.623 Pressure ulcer of left heel, stage 3 Modifier: Quantity: 1 Physician Procedures Electronic Signature(s) Signed: 11/17/2022 5:06:52 PM By: Baltazar Najjar MD Entered By: Baltazar Najjar on 11/17/2022 11:32:24

## 2022-11-20 NOTE — Progress Notes (Signed)
Dawn, DORNER [name] ([id number]) [id number] Page 1 of 8 Visit Report for 11/17/2022 Arrival Information Details Patient Name: Date of Service: [name] 11/17/2022 1:30 PM Medical Record Number: [id number] Patient Account Number: [id number] Date of Birth/Sex: Treating RN: Oct 15, 1950 (72 y.o. [name] Primary Care Rahn Lacuesta: [name] Other Clinician: Betha Loa Referring Skipper Dacosta: Treating Glen Kesinger/Extender: Chauncey Mann, MICHA EL Ilean Skill Weeks in Treatment: 6 Visit Information History Since Last Visit All ordered tests and consults were completed: No Patient Arrived: Wheel Chair Added or deleted any medications: No Arrival Time: 13:49 Any new allergies or adverse reactions: No Transfer Assistance: EasyPivot Patient Lift Had a fall or experienced change in No Patient Identification Verified: Yes activities of daily living that may affect Secondary Verification Process Completed: Yes risk of falls: Patient Requires Transmission-Based Precautions: No Signs or symptoms of abuse/neglect since last visito No Patient Has Alerts: Yes Hospitalized since last visit: No Patient Alerts: Patient on Blood Thinner Implantable device outside of the clinic excluding No ABI L 1.1 02/05/22 cellular tissue based products placed in the center ABI R 1.12 02/05/22 since last visit: Has Dressing in Place as Prescribed: Yes Has Compression in Place as Prescribed: Yes Pain Present Now: Yes Electronic Signature(s) Signed: 11/18/2022 4:41:54 PM By: Betha Loa Entered By: Betha Loa on 11/17/2022 10:56:20 -------------------------------------------------------------------------------- Clinic Level of Care Assessment Details Patient Name: Date of Service: [name] 11/17/2022 1:30 PM Medical Record Number: [id number] Patient Account Number: [id number] Date of Birth/Sex: Treating RN: 07-28-1950 (72 y.o. [name] Primary Care  [name]: [name] Other Clinician: Betha Loa Referring [name]: Treating [name]/Extender: Chauncey Mann, MICHA EL Ilean Skill Weeks in Treatment: 6 Clinic Level of Care Assessment Items TOOL 1 Quantity Score []  - 0 Use when EandM and Procedure is performed on INITIAL visit ASSESSMENTS - Nursing Assessment / Reassessment []  - 0 General Physical Exam (combine w/ comprehensive assessment (listed just below) when performed on new pt. evalsKAMRIN, ALLENDORF [name] ([id number]) [id number] Page 2 of 8 []  - 0 Comprehensive Assessment (HX, ROS, Risk Assessments, Wounds Hx, etc.) ASSESSMENTS - Wound and Skin Assessment / Reassessment []  - 0 Dermatologic / Skin Assessment (not related to wound area) ASSESSMENTS - Ostomy and/or Continence Assessment and Care []  - 0 Incontinence Assessment and Management []  - 0 Ostomy Care Assessment and Management (repouching, etc.) PROCESS - Coordination of Care []  - 0 Simple Patient / Family Education for ongoing care []  - 0 Complex (extensive) Patient / Family Education for ongoing care []  - 0 Staff obtains Chiropractor, Records, T Results / Process Orders est []  - 0 Staff telephones HHA, Nursing Homes / Clarify orders / etc []  - 0 Routine Transfer to another Facility (non-emergent condition) []  - 0 Routine Hospital Admission (non-emergent condition) []  - 0 New Admissions / Manufacturing engineer / Ordering NPWT Apligraf, etc. , []  - 0 Emergency Hospital Admission (emergent condition) PROCESS - Special Needs []  - 0 Pediatric / Minor Patient Management []  - 0 Isolation Patient Management []  - 0 Hearing / Language / Visual special needs []  - 0 Assessment of Community assistance (transportation, D/C planning, etc.) []  - 0 Additional assistance / Altered mentation []  - 0 Support Surface(s) Assessment (bed, cushion, seat, etc.) INTERVENTIONS - Miscellaneous []  - 0 External ear exam []  - 0 Patient Transfer  (multiple staff / Nurse, adult / Similar devices) []  - 0 Simple Staple / Suture removal (25 or less) []  - 0 Complex Staple / Suture  removal (26 or more) []  - 0 Hypo/Hyperglycemic Management (do not check if billed separately) []  - 0 Ankle / Brachial Index (ABI) - do not check if billed separately Has the patient been seen at the hospital within the last three years: Yes Total Score: 0 Level Of Care: ____ Electronic Signature(s) Signed: 11/18/2022 4:41:54 PM By: Betha Loa Entered By: Betha Loa on 11/17/2022 11:20:40 -------------------------------------------------------------------------------- Lower Extremity Assessment Details Patient Name: Date of Service: [name] 11/17/2022 1:30 PM Medical Record Number: [id number] Patient Account Number: [id number] Date of Birth/Sex: Treating RN: [birth year] (72 y.o. [name] Primary Care Mandee Pluta: [name] Other Clinician: Exie, Heikkila ([id number]) [id number] Page 3 of 8 Referring Stony Stegmann: Treating Syann Cupples/Extender: RO BSO N, MICHA EL Ilean Skill Weeks in Treatment: 6 Edema Assessment Assessed: [Left: Yes] [Right: No] Edema: [Left: Ye] [Right: s] Calf Left: Right: Point of Measurement: 33 cm From Medial Instep 34.3 cm Ankle Left: Right: Point of Measurement: 7 cm From Medial Instep 17.5 cm Knee To Floor Left: Right: From Medial Instep 46 cm Vascular Assessment Pulses: Dorsalis Pedis Palpable: [Left:Yes] Electronic Signature(s) Signed: 11/18/2022 4:41:54 PM By: Betha Loa Signed: 11/20/2022 11:58:03 AM By: Yevonne Pax RN Entered By: Betha Loa on 11/17/2022 11:07:07 -------------------------------------------------------------------------------- Multi Wound Chart Details Patient Name: Date of Service: [name], [name]. 11/17/2022 1:30 PM Medical Record Number: [id number] Patient Account Number: [id number] Date of Birth/Sex: Treating  RN: 07-28-1950 (72 y.o. [name] Primary Care Delayni Streed: [name] Other Clinician: Betha Loa Referring Malaiyah Achorn: Treating Rickey Farrier/Extender: Chauncey Mann, MICHA EL Reynolds Bowl, Arville Lime Weeks in Treatment: 6 Vital Signs Height(in): 64 Pulse(bpm): 74 Weight(lbs): 250 Blood Pressure(mmHg): 104/70 Body Mass Index(BMI): 42.9 Temperature(F): 97.9 Respiratory Rate(breaths/min): 18 [24:Photos:] [N/A:N/A] ZYAUNA, DOMENICO ([id number]) [24:Left Calcaneus Wound Location: Gradually Appeared Wounding Event: Pressure Ulcer Primary Etiology: Asthma, Pneumothorax, Arrhythmia, N/A Comorbid History: Hypertension, Type II Diabetes, Gout, Osteoarthritis, Neuropathy 08/22/2022 Date Acquired: 6  Weeks of Treatment: Open Wound Status: No Wound Recurrence: 0.8x1.2x0.2 Measurements L x W x D (cm) 0.754 A (cm) : rea 0.151 Volume (cm) : 36.00% % Reduction in A rea: -28.00% % Reduction in Volume: Category/Stage III Classification: Small Exudate A  mount: Serous Exudate Type: amber Exudate Color: Distinct, outline attached Wound Margin: Small (1-33%) Granulation A mount: Red Granulation Quality: Medium (34-66%) Necrotic A mount: Fat Layer (Subcutaneous Tissue): Yes N/A Exposed Structures: Fascia:  No Tendon: No Muscle: No Joint: No Bone: No None Epithelialization:] [N/A:N/A N/A N/A N/A N/A N/A N/A N/A N/A N/A N/A N/A N/A N/A N/A N/A N/A N/A N/A N/A N/A] Treatment Notes Electronic Signature(s) Signed: 11/18/2022 4:41:54 PM By: Betha Loa Entered By: Betha Loa on 11/17/2022 11:07:16 -------------------------------------------------------------------------------- Multi-Disciplinary Care Plan Details Patient Name: Date of Service: RO Yolanda Manges, [name]. 11/17/2022 1:30 PM Medical Record Number: [id number] Patient Account Number: [id number] Date of Birth/Sex: Treating RN: September 10, 1950 (72 y.o. [name] Primary Care Raysha Tilmon: [name] Other Clinician: Betha Loa Referring Koi Zangara: Treating  Zurisadai Helminiak/Extender: Chauncey Mann, MICHA EL Ilean Skill Weeks in Treatment: 6 Active Inactive Necrotic Tissue Nursing Diagnoses: Knowledge deficit related to management of necrotic/devitalized tissue Goals: Necrotic/devitalized tissue will be minimized in the wound bed Date Initiated: 10/01/2022 Target Resolution Date: 11/01/2022 Goal Status: Active Interventions: Assess patient pain level pre-, during and post procedure and prior to discharge Treatment Activities: Apply topical anesthetic as ordered : 10/01/2022 TEARAH, BRINK [name] ([id number]) [phone].pdf Page 5 of 8 Notes: Wound/Skin Impairment Nursing Diagnoses: Knowledge  deficit related to ulceration/compromised skin integrity Goals: Patient/caregiver will verbalize understanding of skin care regimen Date Initiated: 10/01/2022 Target Resolution Date: 11/01/2022 Goal Status: Active Ulcer/skin breakdown will have a volume reduction of 30% by week 4 Date Initiated: 10/01/2022 Target Resolution Date: 11/01/2022 Goal Status: Active Ulcer/skin breakdown will have a volume reduction of 50% by week 8 Date Initiated: 10/01/2022 Target Resolution Date: 12/02/2022 Goal Status: Active Ulcer/skin breakdown will have a volume reduction of 80% by week 12 Date Initiated: 10/01/2022 Target Resolution Date: 01/01/2023 Goal Status: Active Ulcer/skin breakdown will heal within 14 weeks Date Initiated: 10/01/2022 Target Resolution Date: 02/01/2023 Goal Status: Active Interventions: Assess patient/caregiver ability to obtain necessary supplies Assess patient/caregiver ability to perform ulcer/skin care regimen upon admission and as needed Assess ulceration(s) every visit Notes: Electronic Signature(s) Signed: 11/18/2022 4:41:54 PM By: Betha Loa Signed: 11/20/2022 11:58:03 AM By: Yevonne Pax RN Entered By: Betha Loa on 11/17/2022  11:20:52 -------------------------------------------------------------------------------- Pain Assessment Details Patient Name: Date of Service: RO Yolanda Manges, [name]. 11/17/2022 1:30 PM Medical Record Number: [id number] Patient Account Number: [id number] Date of Birth/Sex: Treating RN: 10/14/1950 (72 y.o. [name] Primary Care Annabelle Rexroad: [name] Other Clinician: Betha Loa Referring Akirah Storck: Treating Furkan Keenum/Extender: Chauncey Mann, MICHA EL Ilean Skill Weeks in Treatment: 6 Active Problems Location of Pain Severity and Description of Pain Patient Has Paino Yes Site Locations Pain LocationWILHEMINA, GARIEPY [name] ([id number]) [id number] Page 6 of 8 Pain Location: Pain in Ulcers Duration of the Pain. Constant / Intermittento Constant Rate the pain. Current Pain Level: 4 Character of Pain Describe the Pain: Burning, Other: stinging Pain Management and Medication Current Pain Management: Medication: Yes Cold Application: No Rest: No Massage: No Activity: No T.E.N.S.: No Heat Application: No Leg drop or elevation: No Is the Current Pain Management Adequate: Inadequate How does your wound impact your activities of daily livingo Sleep: No Bathing: No Appetite: No Relationship With Others: No Bladder Continence: No Emotions: No Bowel Continence: No Work: No Toileting: No Drive: No Dressing: No Hobbies: No Electronic Signature(s) Signed: 11/18/2022 4:41:54 PM By: Betha Loa Signed: 11/20/2022 11:58:03 AM By: Yevonne Pax RN Entered By: Betha Loa on 11/17/2022 11:00:05 -------------------------------------------------------------------------------- Patient/Caregiver Education Details Patient Name: Date of Service: [name] 8/27/2024andnbsp1:30 PM Medical Record Number: [id number] Patient Account Number: [id number] Date of Birth/Gender: Treating RN: [birth year] (72 y.o. [name] Primary Care Physician: [name] Other Clinician: Betha Loa Referring Physician: Treating Physician/Extender: Chauncey Mann, MICHA EL Genia Plants in Treatment: 6 Education Assessment Education Provided To: Patient Education Topics Provided Wound/Skin Impairment: Handouts: Other: continue wound care as directed AIBHLINN, CIARAVINO [name] ([id number]) [id number] Page 7 of 8 Methods: Explain/Verbal Responses: State content correctly Electronic Signature(s) Signed: 11/18/2022 4:41:54 PM By: Betha Loa Entered By: Betha Loa on 11/17/2022 12:43:17 -------------------------------------------------------------------------------- Wound Assessment Details Patient Name: Date of Service: [name] 11/17/2022 1:30 PM Medical Record Number: [id number] Patient Account Number: [id number] Date of Birth/Sex: Treating RN: 05-27-1950 (72 y.o. [name] Primary Care Rahcel Shutes: [name] Other Clinician: Betha Loa Referring Sevrin Sally: Treating Carneshia Raker/Extender: Chauncey Mann, MICHA EL Ilean Skill Weeks in Treatment: 6 Wound Status Wound Number: 24 Primary Pressure Ulcer Etiology: Wound Location: Left Calcaneus Wound Open Wounding Event: Gradually Appeared Status: Date Acquired: 08/22/2022 Comorbid Asthma, Pneumothorax, Arrhythmia, Hypertension, Type II Weeks Of Treatment: 6 History: Diabetes, Gout, Osteoarthritis, Neuropathy Clustered Wound: No Photos Wound Measurements Length: (cm) 0.8 Width: (cm) 1.2 Depth: (cm) 0.2 Area: (cm)  0.754 Volume: (cm) 0.151 % Reduction in Area: 36% % Reduction in Volume: -28% Epithelialization: None Wound Description Classification: Category/Stage III Wound Margin: Distinct, outline attached Exudate Amount: Small Exudate Type: Serous Exudate Color: amber Foul Odor After Cleansing: No Slough/Fibrino Yes Wound Bed Granulation Amount: Small (1-33%) Exposed Structure Granulation Quality: Red Fascia Exposed: No Necrotic Amount:  Medium (34-66%) Fat Layer (Subcutaneous Tissue) Exposed: Yes Necrotic Quality: Adherent Slough, Bone Tendon Exposed: No Muscle Exposed: No LORELAI, MERTA [name] ([id number]) [id number] Page 8 of 8 Joint Exposed: No Bone Exposed: No Electronic Signature(s) Signed: 11/18/2022 4:41:54 PM By: Betha Loa Signed: 11/20/2022 11:58:03 AM By: Yevonne Pax RN Entered By: Betha Loa on 11/17/2022 11:05:44 -------------------------------------------------------------------------------- Vitals Details Patient Name: Date of Service: RO Yolanda Manges, [name]. 11/17/2022 1:30 PM Medical Record Number: [id number] Patient Account Number: [id number] Date of Birth/Sex: Treating RN: 1951/02/25 (72 y.o. [name] Primary Care Patsey Pitstick: [name] Other Clinician: Betha Loa Referring Kortnie Stovall: Treating Marice Guidone/Extender: Chauncey Mann, MICHA EL Ilean Skill Weeks in Treatment: 6 Vital Signs Time Taken: 13:57 Temperature (F): 97.9 Height (in): 64 Pulse (bpm): 74 Weight (lbs): 250 Respiratory Rate (breaths/min): 18 Body Mass Index (BMI): 42.9 Blood Pressure (mmHg): 104/70 Reference Range: 80 - 120 mg / dl Electronic Signature(s) Signed: 11/18/2022 4:41:54 PM By: Betha Loa Entered By: Betha Loa on 11/17/2022 10:59:54

## 2022-11-27 ENCOUNTER — Ambulatory Visit: Payer: Medicare HMO | Admitting: Physician Assistant

## 2022-11-30 ENCOUNTER — Ambulatory Visit: Payer: Medicare HMO | Admitting: Physician Assistant

## 2022-11-30 ENCOUNTER — Encounter: Payer: Medicare HMO | Admitting: Pain Medicine

## 2022-12-02 ENCOUNTER — Telehealth: Payer: Self-pay

## 2022-12-02 ENCOUNTER — Other Ambulatory Visit: Payer: Self-pay

## 2022-12-02 DIAGNOSIS — Z8673 Personal history of transient ischemic attack (TIA), and cerebral infarction without residual deficits: Secondary | ICD-10-CM | POA: Diagnosis not present

## 2022-12-02 DIAGNOSIS — G253 Myoclonus: Secondary | ICD-10-CM | POA: Diagnosis not present

## 2022-12-02 DIAGNOSIS — R531 Weakness: Secondary | ICD-10-CM | POA: Diagnosis not present

## 2022-12-02 DIAGNOSIS — G8194 Hemiplegia, unspecified affecting left nondominant side: Secondary | ICD-10-CM | POA: Diagnosis not present

## 2022-12-02 NOTE — Telephone Encounter (Signed)
Well care summary report was received and placed in provider review folder waiting initials to be scanned in chart

## 2022-12-04 ENCOUNTER — Other Ambulatory Visit: Payer: Self-pay | Admitting: Neurology

## 2022-12-04 DIAGNOSIS — G253 Myoclonus: Secondary | ICD-10-CM

## 2022-12-07 ENCOUNTER — Ambulatory Visit: Payer: Medicare HMO | Admitting: Physician Assistant

## 2022-12-08 ENCOUNTER — Telehealth: Payer: Self-pay

## 2022-12-08 ENCOUNTER — Other Ambulatory Visit: Payer: Self-pay | Admitting: Nurse Practitioner

## 2022-12-08 ENCOUNTER — Ambulatory Visit
Admission: RE | Admit: 2022-12-08 | Discharge: 2022-12-08 | Disposition: A | Discharge status: Home / Self Care | Payer: Medicare HMO | Source: Ambulatory Visit | Attending: Neurology | Admitting: Neurology

## 2022-12-08 ENCOUNTER — Encounter: Payer: Medicare HMO | Attending: Physician Assistant | Admitting: Physician Assistant

## 2022-12-08 DIAGNOSIS — E039 Hypothyroidism, unspecified: Secondary | ICD-10-CM

## 2022-12-08 DIAGNOSIS — G253 Myoclonus: Secondary | ICD-10-CM | POA: Diagnosis not present

## 2022-12-08 DIAGNOSIS — I6782 Cerebral ischemia: Secondary | ICD-10-CM | POA: Diagnosis not present

## 2022-12-08 DIAGNOSIS — L89616 Pressure-induced deep tissue damage of right heel: Secondary | ICD-10-CM | POA: Insufficient documentation

## 2022-12-08 DIAGNOSIS — L89623 Pressure ulcer of left heel, stage 3: Secondary | ICD-10-CM | POA: Diagnosis not present

## 2022-12-08 DIAGNOSIS — Z7901 Long term (current) use of anticoagulants: Secondary | ICD-10-CM | POA: Insufficient documentation

## 2022-12-08 DIAGNOSIS — E854 Organ-limited amyloidosis: Secondary | ICD-10-CM | POA: Diagnosis not present

## 2022-12-08 DIAGNOSIS — E1165 Type 2 diabetes mellitus with hyperglycemia: Secondary | ICD-10-CM

## 2022-12-08 DIAGNOSIS — I480 Paroxysmal atrial fibrillation: Secondary | ICD-10-CM | POA: Insufficient documentation

## 2022-12-08 DIAGNOSIS — I872 Venous insufficiency (chronic) (peripheral): Secondary | ICD-10-CM | POA: Diagnosis not present

## 2022-12-08 DIAGNOSIS — J4540 Moderate persistent asthma, uncomplicated: Secondary | ICD-10-CM

## 2022-12-08 DIAGNOSIS — E119 Type 2 diabetes mellitus without complications: Secondary | ICD-10-CM | POA: Insufficient documentation

## 2022-12-08 NOTE — Progress Notes (Signed)
serous drainage noted. The wound margin is distinct with the outline attached to the wound base. There is small (1-33%) red granulation within the wound bed. There is a medium (34-66%) amount of necrotic tissue within the wound bed including Adherent Slough and Necrosis of Bone. Assessment Active Problems ICD-10 Type 2 diabetes mellitus with foot ulcer Chronic venous hypertension (idiopathic) with inflammation of bilateral lower extremity Pressure ulcer of left heel, stage 3 Pressure-induced deep tissue damage of right heel Long term (current) use of anticoagulants Paroxysmal atrial fibrillation Plan Follow-up Appointments: Return Appointment in 1 week. Home Health: [facility] for wound care. May utilize formulary equivalent dressing for wound treatment orders unless otherwise specified. Home Health Nurse may visit PRN to address patients wound care needs. Rolene Arbour for wound care to left calcan [phone] Anesthetic (Use 'Patient Medications' Section for  Anesthetic Order Entry): Lidocaine applied to wound bed Edema Control - Lymphedema / Segmental Compressive Device / Other: Elevate, Exercise Daily and Avoid Standing for Long Periods of Time. Elevate legs to the level of the heart and pump ankles as often as possible Elevate leg(s) parallel to the floor when sitting. WOUND #24: - Calcaneus Wound Laterality: Left Cleanser: Normal Saline 1 x Per Day/30 Days Discharge Instructions: Wash your hands with soap and water. Remove old dressing, discard into plastic bag and place into trash. Cleanse the wound with Normal Saline prior to applying a clean dressing using gauze sponges, not tissues or cotton balls. Do not scrub or use excessive force. Pat dry using gauze sponges, not tissue or cotton balls. Cleanser: Soap and Water 1 x Per Day/30 Days Discharge Instructions: Gently cleanse wound with antibacterial soap, rinse and pat dry prior to dressing wounds Prim Dressing: Promogran Matrix 4.34 (in) 1 x Per Day/30 Days ary Discharge Instructions: Moisten w/normal saline or sterile water; Cover wound as directed. Do not remove from wound bed. Secondary Dressing: Coverlet Latex-Free Fabric Adhesive Dressings 1 x Per Day/30 Days Discharge Instructions: 1.5 x 2 1. I am going to recommend that we have the patient continue with the Promogran which I think is doing really well right now. LAYTEN, KLEPP ([id number]) [id number] Page 9 of 9 2. I am also can recommend that we have the patient continue with the bordered foam dressing to cover. 3. And also can recommend the patient should continue to monitor for any evidence of infection or worsening. Office if anything changes she knows contact the office and let me know. We will see patient back for reevaluation in 1 week here in the clinic. If anything worsens or changes patient will contact our office for additional recommendations. Electronic Signature(s) Signed: 12/08/2022  8:59:33 AM By: Allen Derry PA-C Entered By: Allen Derry on 12/08/2022 05:59:33 -------------------------------------------------------------------------------- SuperBill Details Patient Name: Date of Service: RO Yolanda Manges, [place] 12/08/2022 Medical Record Number: [id number] Patient Account Number: [id number] Date of Birth/Sex: Treating RN: Jul 14, 1950 (72 y.o. [name] Primary Care Provider: [name] Other Clinician: Betha Loa Referring Provider: Treating Provider/Extender: Algis Greenhouse Weeks in Treatment: 9 Diagnosis Coding ICD-10 Codes Code Description E11.621 Type 2 diabetes mellitus with foot ulcer I87.323 Chronic venous hypertension (idiopathic) with inflammation of bilateral lower extremity L89.623 Pressure ulcer of left heel, stage 3 L89.616 Pressure-induced deep tissue damage of right heel Z79.01 Long term (current) use of anticoagulants I48.0 Paroxysmal atrial fibrillation Facility Procedures : CPT4 Code: 84132440 Description: 10272 - WOUND CARE VISIT-LEV 2 EST PT Modifier: Quantity: 1 Physician Procedures : CPT4 Code Description  and at this point I think that she is making headway towards getting this closed although I do believe there is quite a bit of slough and biofilm skin need to be cleaned off just a little bit of callus around the edges of the wound. 11-10-2022 upon evaluation today patient appears to be doing well currently in regard to her heel ulcer. She has been tolerating the dressing changes without complication and in general I feel like she is making excellent headway towards complete closure. 8/27; patient has a wound on the left heel near the tip. Using Promogran and Zituvimet. Comes in with open heeled  shoes however it has a lip that encompasses the wound area which is on the distal part of the Achilles area. 12-08-2022 upon evaluation today patient appears to be doing well currently in regard to her wound on the foot which is actually doing excellent. This heel is measuring about 60% smaller than last time I saw her. Fortunately I think that she is really doing quite well at this time. Electronic Signature(s) Signed: 12/08/2022 8:47:25 AM By: Allen Derry PA-C Entered By: Allen Derry on 12/08/2022 05:47:24 -------------------------------------------------------------------------------- Physical Exam Details Patient Name: Date of Service: [name] 12/08/2022 8:15 A M Medical Record Number: [id number] Patient Account Number: [id number] Date of Birth/Sex: Treating RN: [birth year] (72 y.o. [name] Primary Care Provider: [name] Other Clinician: Betha Loa Referring Provider: Treating Provider/Extender: Algis Greenhouse Weeks in Treatment: 9 Constitutional Well-nourished and well-hydrated in no acute distress. Respiratory normal breathing without difficulty. Psychiatric this patient is able to make decisions and demonstrates good insight into disease process. Alert and Oriented x 3. pleasant and cooperative. VERDELL, BASICH ([id number]) [id number] Page 4 of 9 Notes Upon inspection patient's wound bed showed evidence of good granulation and epithelization at this point. Fortunately I see no evidence of infection locally or systemically which is great news and in general I do believe that we are making headway towards complete closure. Electronic Signature(s) Signed: 12/08/2022 8:47:56 AM By: Allen Derry PA-C Entered By: Allen Derry on 12/08/2022 05:47:56 -------------------------------------------------------------------------------- Physician Orders Details Patient Name: Date of Service: RO Yolanda Manges, [place] B. 12/08/2022 8:15 A  M Medical Record Number: [id number] Patient Account Number: [id number] Date of Birth/Sex: Treating RN: 1950-07-07 (72 y.o. Sharyne Peach, [place] Primary Care Provider: [name] Other Clinician: Betha Loa Referring Provider: Treating Provider/Extender: Amedeo Kinsman in Treatment: 9 Verbal / Phone Orders: Yes Clinician: Yevonne Pax Read Back and Verified: Yes Diagnosis Coding ICD-10 Coding Code Description E11.621 Type 2 diabetes mellitus with foot ulcer I87.323 Chronic venous hypertension (idiopathic) with inflammation of bilateral lower extremity L89.623 Pressure ulcer of left heel, stage 3 L89.616 Pressure-induced deep tissue damage of right heel Z79.01 Long term (current) use of anticoagulants I48.0 Paroxysmal atrial fibrillation Follow-up Appointments Return Appointment in 1 week. Home Health [facility] for wound care. May utilize formulary equivalent dressing for wound treatment orders unless otherwise specified. Home Health Nurse may visit PRN to address patients wound care needs. Rolene Arbour for wound care to left calcan [phone] Anesthetic (Use 'Patient Medications' Section for Anesthetic Order Entry) Lidocaine applied to wound bed Edema Control - Lymphedema / Segmental Compressive Device / Other Elevate, Exercise Daily and A void Standing for Long Periods of Time. Elevate legs to the level of the heart and pump ankles as often as possible Elevate leg(s) parallel to the floor when sitting. Wound Treatment Wound #24 - Calcaneus Wound Laterality: Left  and at this point I think that she is making headway towards getting this closed although I do believe there is quite a bit of slough and biofilm skin need to be cleaned off just a little bit of callus around the edges of the wound. 11-10-2022 upon evaluation today patient appears to be doing well currently in regard to her heel ulcer. She has been tolerating the dressing changes without complication and in general I feel like she is making excellent headway towards complete closure. 8/27; patient has a wound on the left heel near the tip. Using Promogran and Zituvimet. Comes in with open heeled  shoes however it has a lip that encompasses the wound area which is on the distal part of the Achilles area. 12-08-2022 upon evaluation today patient appears to be doing well currently in regard to her wound on the foot which is actually doing excellent. This heel is measuring about 60% smaller than last time I saw her. Fortunately I think that she is really doing quite well at this time. Electronic Signature(s) Signed: 12/08/2022 8:47:25 AM By: Allen Derry PA-C Entered By: Allen Derry on 12/08/2022 05:47:24 -------------------------------------------------------------------------------- Physical Exam Details Patient Name: Date of Service: [name] 12/08/2022 8:15 A M Medical Record Number: [id number] Patient Account Number: [id number] Date of Birth/Sex: Treating RN: [birth year] (72 y.o. [name] Primary Care Provider: [name] Other Clinician: Betha Loa Referring Provider: Treating Provider/Extender: Algis Greenhouse Weeks in Treatment: 9 Constitutional Well-nourished and well-hydrated in no acute distress. Respiratory normal breathing without difficulty. Psychiatric this patient is able to make decisions and demonstrates good insight into disease process. Alert and Oriented x 3. pleasant and cooperative. VERDELL, BASICH ([id number]) [id number] Page 4 of 9 Notes Upon inspection patient's wound bed showed evidence of good granulation and epithelization at this point. Fortunately I see no evidence of infection locally or systemically which is great news and in general I do believe that we are making headway towards complete closure. Electronic Signature(s) Signed: 12/08/2022 8:47:56 AM By: Allen Derry PA-C Entered By: Allen Derry on 12/08/2022 05:47:56 -------------------------------------------------------------------------------- Physician Orders Details Patient Name: Date of Service: RO Yolanda Manges, [place] B. 12/08/2022 8:15 A  M Medical Record Number: [id number] Patient Account Number: [id number] Date of Birth/Sex: Treating RN: 1950-07-07 (72 y.o. Sharyne Peach, [place] Primary Care Provider: [name] Other Clinician: Betha Loa Referring Provider: Treating Provider/Extender: Amedeo Kinsman in Treatment: 9 Verbal / Phone Orders: Yes Clinician: Yevonne Pax Read Back and Verified: Yes Diagnosis Coding ICD-10 Coding Code Description E11.621 Type 2 diabetes mellitus with foot ulcer I87.323 Chronic venous hypertension (idiopathic) with inflammation of bilateral lower extremity L89.623 Pressure ulcer of left heel, stage 3 L89.616 Pressure-induced deep tissue damage of right heel Z79.01 Long term (current) use of anticoagulants I48.0 Paroxysmal atrial fibrillation Follow-up Appointments Return Appointment in 1 week. Home Health [facility] for wound care. May utilize formulary equivalent dressing for wound treatment orders unless otherwise specified. Home Health Nurse may visit PRN to address patients wound care needs. Rolene Arbour for wound care to left calcan [phone] Anesthetic (Use 'Patient Medications' Section for Anesthetic Order Entry) Lidocaine applied to wound bed Edema Control - Lymphedema / Segmental Compressive Device / Other Elevate, Exercise Daily and A void Standing for Long Periods of Time. Elevate legs to the level of the heart and pump ankles as often as possible Elevate leg(s) parallel to the floor when sitting. Wound Treatment Wound #24 - Calcaneus Wound Laterality: Left  serous drainage noted. The wound margin is distinct with the outline attached to the wound base. There is small (1-33%) red granulation within the wound bed. There is a medium (34-66%) amount of necrotic tissue within the wound bed including Adherent Slough and Necrosis of Bone. Assessment Active Problems ICD-10 Type 2 diabetes mellitus with foot ulcer Chronic venous hypertension (idiopathic) with inflammation of bilateral lower extremity Pressure ulcer of left heel, stage 3 Pressure-induced deep tissue damage of right heel Long term (current) use of anticoagulants Paroxysmal atrial fibrillation Plan Follow-up Appointments: Return Appointment in 1 week. Home Health: [facility] for wound care. May utilize formulary equivalent dressing for wound treatment orders unless otherwise specified. Home Health Nurse may visit PRN to address patients wound care needs. Rolene Arbour for wound care to left calcan [phone] Anesthetic (Use 'Patient Medications' Section for  Anesthetic Order Entry): Lidocaine applied to wound bed Edema Control - Lymphedema / Segmental Compressive Device / Other: Elevate, Exercise Daily and Avoid Standing for Long Periods of Time. Elevate legs to the level of the heart and pump ankles as often as possible Elevate leg(s) parallel to the floor when sitting. WOUND #24: - Calcaneus Wound Laterality: Left Cleanser: Normal Saline 1 x Per Day/30 Days Discharge Instructions: Wash your hands with soap and water. Remove old dressing, discard into plastic bag and place into trash. Cleanse the wound with Normal Saline prior to applying a clean dressing using gauze sponges, not tissues or cotton balls. Do not scrub or use excessive force. Pat dry using gauze sponges, not tissue or cotton balls. Cleanser: Soap and Water 1 x Per Day/30 Days Discharge Instructions: Gently cleanse wound with antibacterial soap, rinse and pat dry prior to dressing wounds Prim Dressing: Promogran Matrix 4.34 (in) 1 x Per Day/30 Days ary Discharge Instructions: Moisten w/normal saline or sterile water; Cover wound as directed. Do not remove from wound bed. Secondary Dressing: Coverlet Latex-Free Fabric Adhesive Dressings 1 x Per Day/30 Days Discharge Instructions: 1.5 x 2 1. I am going to recommend that we have the patient continue with the Promogran which I think is doing really well right now. LAYTEN, KLEPP ([id number]) [id number] Page 9 of 9 2. I am also can recommend that we have the patient continue with the bordered foam dressing to cover. 3. And also can recommend the patient should continue to monitor for any evidence of infection or worsening. Office if anything changes she knows contact the office and let me know. We will see patient back for reevaluation in 1 week here in the clinic. If anything worsens or changes patient will contact our office for additional recommendations. Electronic Signature(s) Signed: 12/08/2022  8:59:33 AM By: Allen Derry PA-C Entered By: Allen Derry on 12/08/2022 05:59:33 -------------------------------------------------------------------------------- SuperBill Details Patient Name: Date of Service: RO Yolanda Manges, [place] 12/08/2022 Medical Record Number: [id number] Patient Account Number: [id number] Date of Birth/Sex: Treating RN: Jul 14, 1950 (72 y.o. [name] Primary Care Provider: [name] Other Clinician: Betha Loa Referring Provider: Treating Provider/Extender: Algis Greenhouse Weeks in Treatment: 9 Diagnosis Coding ICD-10 Codes Code Description E11.621 Type 2 diabetes mellitus with foot ulcer I87.323 Chronic venous hypertension (idiopathic) with inflammation of bilateral lower extremity L89.623 Pressure ulcer of left heel, stage 3 L89.616 Pressure-induced deep tissue damage of right heel Z79.01 Long term (current) use of anticoagulants I48.0 Paroxysmal atrial fibrillation Facility Procedures : CPT4 Code: 84132440 Description: 10272 - WOUND CARE VISIT-LEV 2 EST PT Modifier: Quantity: 1 Physician Procedures : CPT4 Code Description  serous drainage noted. The wound margin is distinct with the outline attached to the wound base. There is small (1-33%) red granulation within the wound bed. There is a medium (34-66%) amount of necrotic tissue within the wound bed including Adherent Slough and Necrosis of Bone. Assessment Active Problems ICD-10 Type 2 diabetes mellitus with foot ulcer Chronic venous hypertension (idiopathic) with inflammation of bilateral lower extremity Pressure ulcer of left heel, stage 3 Pressure-induced deep tissue damage of right heel Long term (current) use of anticoagulants Paroxysmal atrial fibrillation Plan Follow-up Appointments: Return Appointment in 1 week. Home Health: [facility] for wound care. May utilize formulary equivalent dressing for wound treatment orders unless otherwise specified. Home Health Nurse may visit PRN to address patients wound care needs. Rolene Arbour for wound care to left calcan [phone] Anesthetic (Use 'Patient Medications' Section for  Anesthetic Order Entry): Lidocaine applied to wound bed Edema Control - Lymphedema / Segmental Compressive Device / Other: Elevate, Exercise Daily and Avoid Standing for Long Periods of Time. Elevate legs to the level of the heart and pump ankles as often as possible Elevate leg(s) parallel to the floor when sitting. WOUND #24: - Calcaneus Wound Laterality: Left Cleanser: Normal Saline 1 x Per Day/30 Days Discharge Instructions: Wash your hands with soap and water. Remove old dressing, discard into plastic bag and place into trash. Cleanse the wound with Normal Saline prior to applying a clean dressing using gauze sponges, not tissues or cotton balls. Do not scrub or use excessive force. Pat dry using gauze sponges, not tissue or cotton balls. Cleanser: Soap and Water 1 x Per Day/30 Days Discharge Instructions: Gently cleanse wound with antibacterial soap, rinse and pat dry prior to dressing wounds Prim Dressing: Promogran Matrix 4.34 (in) 1 x Per Day/30 Days ary Discharge Instructions: Moisten w/normal saline or sterile water; Cover wound as directed. Do not remove from wound bed. Secondary Dressing: Coverlet Latex-Free Fabric Adhesive Dressings 1 x Per Day/30 Days Discharge Instructions: 1.5 x 2 1. I am going to recommend that we have the patient continue with the Promogran which I think is doing really well right now. LAYTEN, KLEPP ([id number]) [id number] Page 9 of 9 2. I am also can recommend that we have the patient continue with the bordered foam dressing to cover. 3. And also can recommend the patient should continue to monitor for any evidence of infection or worsening. Office if anything changes she knows contact the office and let me know. We will see patient back for reevaluation in 1 week here in the clinic. If anything worsens or changes patient will contact our office for additional recommendations. Electronic Signature(s) Signed: 12/08/2022  8:59:33 AM By: Allen Derry PA-C Entered By: Allen Derry on 12/08/2022 05:59:33 -------------------------------------------------------------------------------- SuperBill Details Patient Name: Date of Service: RO Yolanda Manges, [place] 12/08/2022 Medical Record Number: [id number] Patient Account Number: [id number] Date of Birth/Sex: Treating RN: Jul 14, 1950 (72 y.o. [name] Primary Care Provider: [name] Other Clinician: Betha Loa Referring Provider: Treating Provider/Extender: Algis Greenhouse Weeks in Treatment: 9 Diagnosis Coding ICD-10 Codes Code Description E11.621 Type 2 diabetes mellitus with foot ulcer I87.323 Chronic venous hypertension (idiopathic) with inflammation of bilateral lower extremity L89.623 Pressure ulcer of left heel, stage 3 L89.616 Pressure-induced deep tissue damage of right heel Z79.01 Long term (current) use of anticoagulants I48.0 Paroxysmal atrial fibrillation Facility Procedures : CPT4 Code: 84132440 Description: 10272 - WOUND CARE VISIT-LEV 2 EST PT Modifier: Quantity: 1 Physician Procedures : CPT4 Code Description  serous drainage noted. The wound margin is distinct with the outline attached to the wound base. There is small (1-33%) red granulation within the wound bed. There is a medium (34-66%) amount of necrotic tissue within the wound bed including Adherent Slough and Necrosis of Bone. Assessment Active Problems ICD-10 Type 2 diabetes mellitus with foot ulcer Chronic venous hypertension (idiopathic) with inflammation of bilateral lower extremity Pressure ulcer of left heel, stage 3 Pressure-induced deep tissue damage of right heel Long term (current) use of anticoagulants Paroxysmal atrial fibrillation Plan Follow-up Appointments: Return Appointment in 1 week. Home Health: [facility] for wound care. May utilize formulary equivalent dressing for wound treatment orders unless otherwise specified. Home Health Nurse may visit PRN to address patients wound care needs. Rolene Arbour for wound care to left calcan [phone] Anesthetic (Use 'Patient Medications' Section for  Anesthetic Order Entry): Lidocaine applied to wound bed Edema Control - Lymphedema / Segmental Compressive Device / Other: Elevate, Exercise Daily and Avoid Standing for Long Periods of Time. Elevate legs to the level of the heart and pump ankles as often as possible Elevate leg(s) parallel to the floor when sitting. WOUND #24: - Calcaneus Wound Laterality: Left Cleanser: Normal Saline 1 x Per Day/30 Days Discharge Instructions: Wash your hands with soap and water. Remove old dressing, discard into plastic bag and place into trash. Cleanse the wound with Normal Saline prior to applying a clean dressing using gauze sponges, not tissues or cotton balls. Do not scrub or use excessive force. Pat dry using gauze sponges, not tissue or cotton balls. Cleanser: Soap and Water 1 x Per Day/30 Days Discharge Instructions: Gently cleanse wound with antibacterial soap, rinse and pat dry prior to dressing wounds Prim Dressing: Promogran Matrix 4.34 (in) 1 x Per Day/30 Days ary Discharge Instructions: Moisten w/normal saline or sterile water; Cover wound as directed. Do not remove from wound bed. Secondary Dressing: Coverlet Latex-Free Fabric Adhesive Dressings 1 x Per Day/30 Days Discharge Instructions: 1.5 x 2 1. I am going to recommend that we have the patient continue with the Promogran which I think is doing really well right now. LAYTEN, KLEPP ([id number]) [id number] Page 9 of 9 2. I am also can recommend that we have the patient continue with the bordered foam dressing to cover. 3. And also can recommend the patient should continue to monitor for any evidence of infection or worsening. Office if anything changes she knows contact the office and let me know. We will see patient back for reevaluation in 1 week here in the clinic. If anything worsens or changes patient will contact our office for additional recommendations. Electronic Signature(s) Signed: 12/08/2022  8:59:33 AM By: Allen Derry PA-C Entered By: Allen Derry on 12/08/2022 05:59:33 -------------------------------------------------------------------------------- SuperBill Details Patient Name: Date of Service: RO Yolanda Manges, [place] 12/08/2022 Medical Record Number: [id number] Patient Account Number: [id number] Date of Birth/Sex: Treating RN: Jul 14, 1950 (72 y.o. [name] Primary Care Provider: [name] Other Clinician: Betha Loa Referring Provider: Treating Provider/Extender: Algis Greenhouse Weeks in Treatment: 9 Diagnosis Coding ICD-10 Codes Code Description E11.621 Type 2 diabetes mellitus with foot ulcer I87.323 Chronic venous hypertension (idiopathic) with inflammation of bilateral lower extremity L89.623 Pressure ulcer of left heel, stage 3 L89.616 Pressure-induced deep tissue damage of right heel Z79.01 Long term (current) use of anticoagulants I48.0 Paroxysmal atrial fibrillation Facility Procedures : CPT4 Code: 84132440 Description: 10272 - WOUND CARE VISIT-LEV 2 EST PT Modifier: Quantity: 1 Physician Procedures : CPT4 Code Description  serous drainage noted. The wound margin is distinct with the outline attached to the wound base. There is small (1-33%) red granulation within the wound bed. There is a medium (34-66%) amount of necrotic tissue within the wound bed including Adherent Slough and Necrosis of Bone. Assessment Active Problems ICD-10 Type 2 diabetes mellitus with foot ulcer Chronic venous hypertension (idiopathic) with inflammation of bilateral lower extremity Pressure ulcer of left heel, stage 3 Pressure-induced deep tissue damage of right heel Long term (current) use of anticoagulants Paroxysmal atrial fibrillation Plan Follow-up Appointments: Return Appointment in 1 week. Home Health: [facility] for wound care. May utilize formulary equivalent dressing for wound treatment orders unless otherwise specified. Home Health Nurse may visit PRN to address patients wound care needs. Rolene Arbour for wound care to left calcan [phone] Anesthetic (Use 'Patient Medications' Section for  Anesthetic Order Entry): Lidocaine applied to wound bed Edema Control - Lymphedema / Segmental Compressive Device / Other: Elevate, Exercise Daily and Avoid Standing for Long Periods of Time. Elevate legs to the level of the heart and pump ankles as often as possible Elevate leg(s) parallel to the floor when sitting. WOUND #24: - Calcaneus Wound Laterality: Left Cleanser: Normal Saline 1 x Per Day/30 Days Discharge Instructions: Wash your hands with soap and water. Remove old dressing, discard into plastic bag and place into trash. Cleanse the wound with Normal Saline prior to applying a clean dressing using gauze sponges, not tissues or cotton balls. Do not scrub or use excessive force. Pat dry using gauze sponges, not tissue or cotton balls. Cleanser: Soap and Water 1 x Per Day/30 Days Discharge Instructions: Gently cleanse wound with antibacterial soap, rinse and pat dry prior to dressing wounds Prim Dressing: Promogran Matrix 4.34 (in) 1 x Per Day/30 Days ary Discharge Instructions: Moisten w/normal saline or sterile water; Cover wound as directed. Do not remove from wound bed. Secondary Dressing: Coverlet Latex-Free Fabric Adhesive Dressings 1 x Per Day/30 Days Discharge Instructions: 1.5 x 2 1. I am going to recommend that we have the patient continue with the Promogran which I think is doing really well right now. LAYTEN, KLEPP ([id number]) [id number] Page 9 of 9 2. I am also can recommend that we have the patient continue with the bordered foam dressing to cover. 3. And also can recommend the patient should continue to monitor for any evidence of infection or worsening. Office if anything changes she knows contact the office and let me know. We will see patient back for reevaluation in 1 week here in the clinic. If anything worsens or changes patient will contact our office for additional recommendations. Electronic Signature(s) Signed: 12/08/2022  8:59:33 AM By: Allen Derry PA-C Entered By: Allen Derry on 12/08/2022 05:59:33 -------------------------------------------------------------------------------- SuperBill Details Patient Name: Date of Service: RO Yolanda Manges, [place] 12/08/2022 Medical Record Number: [id number] Patient Account Number: [id number] Date of Birth/Sex: Treating RN: Jul 14, 1950 (72 y.o. [name] Primary Care Provider: [name] Other Clinician: Betha Loa Referring Provider: Treating Provider/Extender: Algis Greenhouse Weeks in Treatment: 9 Diagnosis Coding ICD-10 Codes Code Description E11.621 Type 2 diabetes mellitus with foot ulcer I87.323 Chronic venous hypertension (idiopathic) with inflammation of bilateral lower extremity L89.623 Pressure ulcer of left heel, stage 3 L89.616 Pressure-induced deep tissue damage of right heel Z79.01 Long term (current) use of anticoagulants I48.0 Paroxysmal atrial fibrillation Facility Procedures : CPT4 Code: 84132440 Description: 10272 - WOUND CARE VISIT-LEV 2 EST PT Modifier: Quantity: 1 Physician Procedures : CPT4 Code Description  and at this point I think that she is making headway towards getting this closed although I do believe there is quite a bit of slough and biofilm skin need to be cleaned off just a little bit of callus around the edges of the wound. 11-10-2022 upon evaluation today patient appears to be doing well currently in regard to her heel ulcer. She has been tolerating the dressing changes without complication and in general I feel like she is making excellent headway towards complete closure. 8/27; patient has a wound on the left heel near the tip. Using Promogran and Zituvimet. Comes in with open heeled  shoes however it has a lip that encompasses the wound area which is on the distal part of the Achilles area. 12-08-2022 upon evaluation today patient appears to be doing well currently in regard to her wound on the foot which is actually doing excellent. This heel is measuring about 60% smaller than last time I saw her. Fortunately I think that she is really doing quite well at this time. Electronic Signature(s) Signed: 12/08/2022 8:47:25 AM By: Allen Derry PA-C Entered By: Allen Derry on 12/08/2022 05:47:24 -------------------------------------------------------------------------------- Physical Exam Details Patient Name: Date of Service: [name] 12/08/2022 8:15 A M Medical Record Number: [id number] Patient Account Number: [id number] Date of Birth/Sex: Treating RN: [birth year] (72 y.o. [name] Primary Care Provider: [name] Other Clinician: Betha Loa Referring Provider: Treating Provider/Extender: Algis Greenhouse Weeks in Treatment: 9 Constitutional Well-nourished and well-hydrated in no acute distress. Respiratory normal breathing without difficulty. Psychiatric this patient is able to make decisions and demonstrates good insight into disease process. Alert and Oriented x 3. pleasant and cooperative. VERDELL, BASICH ([id number]) [id number] Page 4 of 9 Notes Upon inspection patient's wound bed showed evidence of good granulation and epithelization at this point. Fortunately I see no evidence of infection locally or systemically which is great news and in general I do believe that we are making headway towards complete closure. Electronic Signature(s) Signed: 12/08/2022 8:47:56 AM By: Allen Derry PA-C Entered By: Allen Derry on 12/08/2022 05:47:56 -------------------------------------------------------------------------------- Physician Orders Details Patient Name: Date of Service: RO Yolanda Manges, [place] B. 12/08/2022 8:15 A  M Medical Record Number: [id number] Patient Account Number: [id number] Date of Birth/Sex: Treating RN: 1950-07-07 (72 y.o. Sharyne Peach, [place] Primary Care Provider: [name] Other Clinician: Betha Loa Referring Provider: Treating Provider/Extender: Amedeo Kinsman in Treatment: 9 Verbal / Phone Orders: Yes Clinician: Yevonne Pax Read Back and Verified: Yes Diagnosis Coding ICD-10 Coding Code Description E11.621 Type 2 diabetes mellitus with foot ulcer I87.323 Chronic venous hypertension (idiopathic) with inflammation of bilateral lower extremity L89.623 Pressure ulcer of left heel, stage 3 L89.616 Pressure-induced deep tissue damage of right heel Z79.01 Long term (current) use of anticoagulants I48.0 Paroxysmal atrial fibrillation Follow-up Appointments Return Appointment in 1 week. Home Health [facility] for wound care. May utilize formulary equivalent dressing for wound treatment orders unless otherwise specified. Home Health Nurse may visit PRN to address patients wound care needs. Rolene Arbour for wound care to left calcan [phone] Anesthetic (Use 'Patient Medications' Section for Anesthetic Order Entry) Lidocaine applied to wound bed Edema Control - Lymphedema / Segmental Compressive Device / Other Elevate, Exercise Daily and A void Standing for Long Periods of Time. Elevate legs to the level of the heart and pump ankles as often as possible Elevate leg(s) parallel to the floor when sitting. Wound Treatment Wound #24 - Calcaneus Wound Laterality: Left  serous drainage noted. The wound margin is distinct with the outline attached to the wound base. There is small (1-33%) red granulation within the wound bed. There is a medium (34-66%) amount of necrotic tissue within the wound bed including Adherent Slough and Necrosis of Bone. Assessment Active Problems ICD-10 Type 2 diabetes mellitus with foot ulcer Chronic venous hypertension (idiopathic) with inflammation of bilateral lower extremity Pressure ulcer of left heel, stage 3 Pressure-induced deep tissue damage of right heel Long term (current) use of anticoagulants Paroxysmal atrial fibrillation Plan Follow-up Appointments: Return Appointment in 1 week. Home Health: [facility] for wound care. May utilize formulary equivalent dressing for wound treatment orders unless otherwise specified. Home Health Nurse may visit PRN to address patients wound care needs. Rolene Arbour for wound care to left calcan [phone] Anesthetic (Use 'Patient Medications' Section for  Anesthetic Order Entry): Lidocaine applied to wound bed Edema Control - Lymphedema / Segmental Compressive Device / Other: Elevate, Exercise Daily and Avoid Standing for Long Periods of Time. Elevate legs to the level of the heart and pump ankles as often as possible Elevate leg(s) parallel to the floor when sitting. WOUND #24: - Calcaneus Wound Laterality: Left Cleanser: Normal Saline 1 x Per Day/30 Days Discharge Instructions: Wash your hands with soap and water. Remove old dressing, discard into plastic bag and place into trash. Cleanse the wound with Normal Saline prior to applying a clean dressing using gauze sponges, not tissues or cotton balls. Do not scrub or use excessive force. Pat dry using gauze sponges, not tissue or cotton balls. Cleanser: Soap and Water 1 x Per Day/30 Days Discharge Instructions: Gently cleanse wound with antibacterial soap, rinse and pat dry prior to dressing wounds Prim Dressing: Promogran Matrix 4.34 (in) 1 x Per Day/30 Days ary Discharge Instructions: Moisten w/normal saline or sterile water; Cover wound as directed. Do not remove from wound bed. Secondary Dressing: Coverlet Latex-Free Fabric Adhesive Dressings 1 x Per Day/30 Days Discharge Instructions: 1.5 x 2 1. I am going to recommend that we have the patient continue with the Promogran which I think is doing really well right now. LAYTEN, KLEPP ([id number]) [id number] Page 9 of 9 2. I am also can recommend that we have the patient continue with the bordered foam dressing to cover. 3. And also can recommend the patient should continue to monitor for any evidence of infection or worsening. Office if anything changes she knows contact the office and let me know. We will see patient back for reevaluation in 1 week here in the clinic. If anything worsens or changes patient will contact our office for additional recommendations. Electronic Signature(s) Signed: 12/08/2022  8:59:33 AM By: Allen Derry PA-C Entered By: Allen Derry on 12/08/2022 05:59:33 -------------------------------------------------------------------------------- SuperBill Details Patient Name: Date of Service: RO Yolanda Manges, [place] 12/08/2022 Medical Record Number: [id number] Patient Account Number: [id number] Date of Birth/Sex: Treating RN: Jul 14, 1950 (72 y.o. [name] Primary Care Provider: [name] Other Clinician: Betha Loa Referring Provider: Treating Provider/Extender: Algis Greenhouse Weeks in Treatment: 9 Diagnosis Coding ICD-10 Codes Code Description E11.621 Type 2 diabetes mellitus with foot ulcer I87.323 Chronic venous hypertension (idiopathic) with inflammation of bilateral lower extremity L89.623 Pressure ulcer of left heel, stage 3 L89.616 Pressure-induced deep tissue damage of right heel Z79.01 Long term (current) use of anticoagulants I48.0 Paroxysmal atrial fibrillation Facility Procedures : CPT4 Code: 84132440 Description: 10272 - WOUND CARE VISIT-LEV 2 EST PT Modifier: Quantity: 1 Physician Procedures : CPT4 Code Description  and at this point I think that she is making headway towards getting this closed although I do believe there is quite a bit of slough and biofilm skin need to be cleaned off just a little bit of callus around the edges of the wound. 11-10-2022 upon evaluation today patient appears to be doing well currently in regard to her heel ulcer. She has been tolerating the dressing changes without complication and in general I feel like she is making excellent headway towards complete closure. 8/27; patient has a wound on the left heel near the tip. Using Promogran and Zituvimet. Comes in with open heeled  shoes however it has a lip that encompasses the wound area which is on the distal part of the Achilles area. 12-08-2022 upon evaluation today patient appears to be doing well currently in regard to her wound on the foot which is actually doing excellent. This heel is measuring about 60% smaller than last time I saw her. Fortunately I think that she is really doing quite well at this time. Electronic Signature(s) Signed: 12/08/2022 8:47:25 AM By: Allen Derry PA-C Entered By: Allen Derry on 12/08/2022 05:47:24 -------------------------------------------------------------------------------- Physical Exam Details Patient Name: Date of Service: [name] 12/08/2022 8:15 A M Medical Record Number: [id number] Patient Account Number: [id number] Date of Birth/Sex: Treating RN: [birth year] (72 y.o. [name] Primary Care Provider: [name] Other Clinician: Betha Loa Referring Provider: Treating Provider/Extender: Algis Greenhouse Weeks in Treatment: 9 Constitutional Well-nourished and well-hydrated in no acute distress. Respiratory normal breathing without difficulty. Psychiatric this patient is able to make decisions and demonstrates good insight into disease process. Alert and Oriented x 3. pleasant and cooperative. VERDELL, BASICH ([id number]) [id number] Page 4 of 9 Notes Upon inspection patient's wound bed showed evidence of good granulation and epithelization at this point. Fortunately I see no evidence of infection locally or systemically which is great news and in general I do believe that we are making headway towards complete closure. Electronic Signature(s) Signed: 12/08/2022 8:47:56 AM By: Allen Derry PA-C Entered By: Allen Derry on 12/08/2022 05:47:56 -------------------------------------------------------------------------------- Physician Orders Details Patient Name: Date of Service: RO Yolanda Manges, [place] B. 12/08/2022 8:15 A  M Medical Record Number: [id number] Patient Account Number: [id number] Date of Birth/Sex: Treating RN: 1950-07-07 (72 y.o. Sharyne Peach, [place] Primary Care Provider: [name] Other Clinician: Betha Loa Referring Provider: Treating Provider/Extender: Amedeo Kinsman in Treatment: 9 Verbal / Phone Orders: Yes Clinician: Yevonne Pax Read Back and Verified: Yes Diagnosis Coding ICD-10 Coding Code Description E11.621 Type 2 diabetes mellitus with foot ulcer I87.323 Chronic venous hypertension (idiopathic) with inflammation of bilateral lower extremity L89.623 Pressure ulcer of left heel, stage 3 L89.616 Pressure-induced deep tissue damage of right heel Z79.01 Long term (current) use of anticoagulants I48.0 Paroxysmal atrial fibrillation Follow-up Appointments Return Appointment in 1 week. Home Health [facility] for wound care. May utilize formulary equivalent dressing for wound treatment orders unless otherwise specified. Home Health Nurse may visit PRN to address patients wound care needs. Rolene Arbour for wound care to left calcan [phone] Anesthetic (Use 'Patient Medications' Section for Anesthetic Order Entry) Lidocaine applied to wound bed Edema Control - Lymphedema / Segmental Compressive Device / Other Elevate, Exercise Daily and A void Standing for Long Periods of Time. Elevate legs to the level of the heart and pump ankles as often as possible Elevate leg(s) parallel to the floor when sitting. Wound Treatment Wound #24 - Calcaneus Wound Laterality: Left  serous drainage noted. The wound margin is distinct with the outline attached to the wound base. There is small (1-33%) red granulation within the wound bed. There is a medium (34-66%) amount of necrotic tissue within the wound bed including Adherent Slough and Necrosis of Bone. Assessment Active Problems ICD-10 Type 2 diabetes mellitus with foot ulcer Chronic venous hypertension (idiopathic) with inflammation of bilateral lower extremity Pressure ulcer of left heel, stage 3 Pressure-induced deep tissue damage of right heel Long term (current) use of anticoagulants Paroxysmal atrial fibrillation Plan Follow-up Appointments: Return Appointment in 1 week. Home Health: [facility] for wound care. May utilize formulary equivalent dressing for wound treatment orders unless otherwise specified. Home Health Nurse may visit PRN to address patients wound care needs. Rolene Arbour for wound care to left calcan [phone] Anesthetic (Use 'Patient Medications' Section for  Anesthetic Order Entry): Lidocaine applied to wound bed Edema Control - Lymphedema / Segmental Compressive Device / Other: Elevate, Exercise Daily and Avoid Standing for Long Periods of Time. Elevate legs to the level of the heart and pump ankles as often as possible Elevate leg(s) parallel to the floor when sitting. WOUND #24: - Calcaneus Wound Laterality: Left Cleanser: Normal Saline 1 x Per Day/30 Days Discharge Instructions: Wash your hands with soap and water. Remove old dressing, discard into plastic bag and place into trash. Cleanse the wound with Normal Saline prior to applying a clean dressing using gauze sponges, not tissues or cotton balls. Do not scrub or use excessive force. Pat dry using gauze sponges, not tissue or cotton balls. Cleanser: Soap and Water 1 x Per Day/30 Days Discharge Instructions: Gently cleanse wound with antibacterial soap, rinse and pat dry prior to dressing wounds Prim Dressing: Promogran Matrix 4.34 (in) 1 x Per Day/30 Days ary Discharge Instructions: Moisten w/normal saline or sterile water; Cover wound as directed. Do not remove from wound bed. Secondary Dressing: Coverlet Latex-Free Fabric Adhesive Dressings 1 x Per Day/30 Days Discharge Instructions: 1.5 x 2 1. I am going to recommend that we have the patient continue with the Promogran which I think is doing really well right now. LAYTEN, KLEPP ([id number]) [id number] Page 9 of 9 2. I am also can recommend that we have the patient continue with the bordered foam dressing to cover. 3. And also can recommend the patient should continue to monitor for any evidence of infection or worsening. Office if anything changes she knows contact the office and let me know. We will see patient back for reevaluation in 1 week here in the clinic. If anything worsens or changes patient will contact our office for additional recommendations. Electronic Signature(s) Signed: 12/08/2022  8:59:33 AM By: Allen Derry PA-C Entered By: Allen Derry on 12/08/2022 05:59:33 -------------------------------------------------------------------------------- SuperBill Details Patient Name: Date of Service: RO Yolanda Manges, [place] 12/08/2022 Medical Record Number: [id number] Patient Account Number: [id number] Date of Birth/Sex: Treating RN: Jul 14, 1950 (72 y.o. [name] Primary Care Provider: [name] Other Clinician: Betha Loa Referring Provider: Treating Provider/Extender: Algis Greenhouse Weeks in Treatment: 9 Diagnosis Coding ICD-10 Codes Code Description E11.621 Type 2 diabetes mellitus with foot ulcer I87.323 Chronic venous hypertension (idiopathic) with inflammation of bilateral lower extremity L89.623 Pressure ulcer of left heel, stage 3 L89.616 Pressure-induced deep tissue damage of right heel Z79.01 Long term (current) use of anticoagulants I48.0 Paroxysmal atrial fibrillation Facility Procedures : CPT4 Code: 84132440 Description: 10272 - WOUND CARE VISIT-LEV 2 EST PT Modifier: Quantity: 1 Physician Procedures : CPT4 Code Description  and at this point I think that she is making headway towards getting this closed although I do believe there is quite a bit of slough and biofilm skin need to be cleaned off just a little bit of callus around the edges of the wound. 11-10-2022 upon evaluation today patient appears to be doing well currently in regard to her heel ulcer. She has been tolerating the dressing changes without complication and in general I feel like she is making excellent headway towards complete closure. 8/27; patient has a wound on the left heel near the tip. Using Promogran and Zituvimet. Comes in with open heeled  shoes however it has a lip that encompasses the wound area which is on the distal part of the Achilles area. 12-08-2022 upon evaluation today patient appears to be doing well currently in regard to her wound on the foot which is actually doing excellent. This heel is measuring about 60% smaller than last time I saw her. Fortunately I think that she is really doing quite well at this time. Electronic Signature(s) Signed: 12/08/2022 8:47:25 AM By: Allen Derry PA-C Entered By: Allen Derry on 12/08/2022 05:47:24 -------------------------------------------------------------------------------- Physical Exam Details Patient Name: Date of Service: [name] 12/08/2022 8:15 A M Medical Record Number: [id number] Patient Account Number: [id number] Date of Birth/Sex: Treating RN: [birth year] (72 y.o. [name] Primary Care Provider: [name] Other Clinician: Betha Loa Referring Provider: Treating Provider/Extender: Algis Greenhouse Weeks in Treatment: 9 Constitutional Well-nourished and well-hydrated in no acute distress. Respiratory normal breathing without difficulty. Psychiatric this patient is able to make decisions and demonstrates good insight into disease process. Alert and Oriented x 3. pleasant and cooperative. VERDELL, BASICH ([id number]) [id number] Page 4 of 9 Notes Upon inspection patient's wound bed showed evidence of good granulation and epithelization at this point. Fortunately I see no evidence of infection locally or systemically which is great news and in general I do believe that we are making headway towards complete closure. Electronic Signature(s) Signed: 12/08/2022 8:47:56 AM By: Allen Derry PA-C Entered By: Allen Derry on 12/08/2022 05:47:56 -------------------------------------------------------------------------------- Physician Orders Details Patient Name: Date of Service: RO Yolanda Manges, [place] B. 12/08/2022 8:15 A  M Medical Record Number: [id number] Patient Account Number: [id number] Date of Birth/Sex: Treating RN: 1950-07-07 (72 y.o. Sharyne Peach, [place] Primary Care Provider: [name] Other Clinician: Betha Loa Referring Provider: Treating Provider/Extender: Amedeo Kinsman in Treatment: 9 Verbal / Phone Orders: Yes Clinician: Yevonne Pax Read Back and Verified: Yes Diagnosis Coding ICD-10 Coding Code Description E11.621 Type 2 diabetes mellitus with foot ulcer I87.323 Chronic venous hypertension (idiopathic) with inflammation of bilateral lower extremity L89.623 Pressure ulcer of left heel, stage 3 L89.616 Pressure-induced deep tissue damage of right heel Z79.01 Long term (current) use of anticoagulants I48.0 Paroxysmal atrial fibrillation Follow-up Appointments Return Appointment in 1 week. Home Health [facility] for wound care. May utilize formulary equivalent dressing for wound treatment orders unless otherwise specified. Home Health Nurse may visit PRN to address patients wound care needs. Rolene Arbour for wound care to left calcan [phone] Anesthetic (Use 'Patient Medications' Section for Anesthetic Order Entry) Lidocaine applied to wound bed Edema Control - Lymphedema / Segmental Compressive Device / Other Elevate, Exercise Daily and A void Standing for Long Periods of Time. Elevate legs to the level of the heart and pump ankles as often as possible Elevate leg(s) parallel to the floor when sitting. Wound Treatment Wound #24 - Calcaneus Wound Laterality: Left  serous drainage noted. The wound margin is distinct with the outline attached to the wound base. There is small (1-33%) red granulation within the wound bed. There is a medium (34-66%) amount of necrotic tissue within the wound bed including Adherent Slough and Necrosis of Bone. Assessment Active Problems ICD-10 Type 2 diabetes mellitus with foot ulcer Chronic venous hypertension (idiopathic) with inflammation of bilateral lower extremity Pressure ulcer of left heel, stage 3 Pressure-induced deep tissue damage of right heel Long term (current) use of anticoagulants Paroxysmal atrial fibrillation Plan Follow-up Appointments: Return Appointment in 1 week. Home Health: [facility] for wound care. May utilize formulary equivalent dressing for wound treatment orders unless otherwise specified. Home Health Nurse may visit PRN to address patients wound care needs. Rolene Arbour for wound care to left calcan [phone] Anesthetic (Use 'Patient Medications' Section for  Anesthetic Order Entry): Lidocaine applied to wound bed Edema Control - Lymphedema / Segmental Compressive Device / Other: Elevate, Exercise Daily and Avoid Standing for Long Periods of Time. Elevate legs to the level of the heart and pump ankles as often as possible Elevate leg(s) parallel to the floor when sitting. WOUND #24: - Calcaneus Wound Laterality: Left Cleanser: Normal Saline 1 x Per Day/30 Days Discharge Instructions: Wash your hands with soap and water. Remove old dressing, discard into plastic bag and place into trash. Cleanse the wound with Normal Saline prior to applying a clean dressing using gauze sponges, not tissues or cotton balls. Do not scrub or use excessive force. Pat dry using gauze sponges, not tissue or cotton balls. Cleanser: Soap and Water 1 x Per Day/30 Days Discharge Instructions: Gently cleanse wound with antibacterial soap, rinse and pat dry prior to dressing wounds Prim Dressing: Promogran Matrix 4.34 (in) 1 x Per Day/30 Days ary Discharge Instructions: Moisten w/normal saline or sterile water; Cover wound as directed. Do not remove from wound bed. Secondary Dressing: Coverlet Latex-Free Fabric Adhesive Dressings 1 x Per Day/30 Days Discharge Instructions: 1.5 x 2 1. I am going to recommend that we have the patient continue with the Promogran which I think is doing really well right now. LAYTEN, KLEPP ([id number]) [id number] Page 9 of 9 2. I am also can recommend that we have the patient continue with the bordered foam dressing to cover. 3. And also can recommend the patient should continue to monitor for any evidence of infection or worsening. Office if anything changes she knows contact the office and let me know. We will see patient back for reevaluation in 1 week here in the clinic. If anything worsens or changes patient will contact our office for additional recommendations. Electronic Signature(s) Signed: 12/08/2022  8:59:33 AM By: Allen Derry PA-C Entered By: Allen Derry on 12/08/2022 05:59:33 -------------------------------------------------------------------------------- SuperBill Details Patient Name: Date of Service: RO Yolanda M[place], [place] 12/08/2022 Medical Record Number: [id number] Patient Account Number: [id number] Date of Birth/Sex: Treating RN: Jul 14, 1950 (72 y.o. [name] Primary Care Provider: [name] Other Clinician: Betha Loa Referring Provider: Treating Provider/Extender: Algis Greenhouse Weeks in Treatment: 9 Diagnosis Coding ICD-10 Codes Code Description E11.621 Type 2 diabetes mellitus with foot ulcer I87.323 Chronic venous hypertension (idiopathic) with inflammation of bilateral lower extremity L89.623 Pressure ulcer of left heel, stage 3 L89.616 Pressure-induced deep tissue damage of right heel Z79.01 Long term (current) use of anticoagulants I48.0 Paroxysmal atrial fibrillation Facility Procedures : CPT4 Code: 84132440 Description: 10272 - WOUND CARE VISIT-LEV 2 EST PT Modifier: Quantity: 1 Physician Procedures : CPT4 Code Description  serous drainage noted. The wound margin is distinct with the outline attached to the wound base. There is small (1-33%) red granulation within the wound bed. There is a medium (34-66%) amount of necrotic tissue within the wound bed including Adherent Slough and Necrosis of Bone. Assessment Active Problems ICD-10 Type 2 diabetes mellitus with foot ulcer Chronic venous hypertension (idiopathic) with inflammation of bilateral lower extremity Pressure ulcer of left heel, stage 3 Pressure-induced deep tissue damage of right heel Long term (current) use of anticoagulants Paroxysmal atrial fibrillation Plan Follow-up Appointments: Return Appointment in 1 week. Home Health: [facility] for wound care. May utilize formulary equivalent dressing for wound treatment orders unless otherwise specified. Home Health Nurse may visit PRN to address patients wound care needs. Rolene Arbour for wound care to left calcan [phone] Anesthetic (Use 'Patient Medications' Section for  Anesthetic Order Entry): Lidocaine applied to wound bed Edema Control - Lymphedema / Segmental Compressive Device / Other: Elevate, Exercise Daily and Avoid Standing for Long Periods of Time. Elevate legs to the level of the heart and pump ankles as often as possible Elevate leg(s) parallel to the floor when sitting. WOUND #24: - Calcaneus Wound Laterality: Left Cleanser: Normal Saline 1 x Per Day/30 Days Discharge Instructions: Wash your hands with soap and water. Remove old dressing, discard into plastic bag and place into trash. Cleanse the wound with Normal Saline prior to applying a clean dressing using gauze sponges, not tissues or cotton balls. Do not scrub or use excessive force. Pat dry using gauze sponges, not tissue or cotton balls. Cleanser: Soap and Water 1 x Per Day/30 Days Discharge Instructions: Gently cleanse wound with antibacterial soap, rinse and pat dry prior to dressing wounds Prim Dressing: Promogran Matrix 4.34 (in) 1 x Per Day/30 Days ary Discharge Instructions: Moisten w/normal saline or sterile water; Cover wound as directed. Do not remove from wound bed. Secondary Dressing: Coverlet Latex-Free Fabric Adhesive Dressings 1 x Per Day/30 Days Discharge Instructions: 1.5 x 2 1. I am going to recommend that we have the patient continue with the Promogran which I think is doing really well right now. LAYTEN, KLEPP ([id number]) [id number] Page 9 of 9 2. I am also can recommend that we have the patient continue with the bordered foam dressing to cover. 3. And also can recommend the patient should continue to monitor for any evidence of infection or worsening. Office if anything changes she knows contact the office and let me know. We will see patient back for reevaluation in 1 week here in the clinic. If anything worsens or changes patient will contact our office for additional recommendations. Electronic Signature(s) Signed: 12/08/2022  8:59:33 AM By: Allen Derry PA-C Entered By: Allen Derry on 12/08/2022 05:59:33 -------------------------------------------------------------------------------- SuperBill Details Patient Name: Date of Service: RO Yolanda M[place], [place] 12/08/2022 Medical Record Number: [id number] Patient Account Number: [id number] Date of Birth/Sex: Treating RN: Jul 14, 1950 (72 y.o. [name] Primary Care Provider: [name] Other Clinician: Betha Loa Referring Provider: Treating Provider/Extender: Algis Greenhouse Weeks in Treatment: 9 Diagnosis Coding ICD-10 Codes Code Description E11.621 Type 2 diabetes mellitus with foot ulcer I87.323 Chronic venous hypertension (idiopathic) with inflammation of bilateral lower extremity L89.623 Pressure ulcer of left heel, stage 3 L89.616 Pressure-induced deep tissue damage of right heel Z79.01 Long term (current) use of anticoagulants I48.0 Paroxysmal atrial fibrillation Facility Procedures : CPT4 Code: [id number] Description: 10272 - WOUND CARE VISIT-LEV 2 EST PT Modifier: Quantity: 1 Physician Procedures : CPT4 Code Description

## 2022-12-08 NOTE — Telephone Encounter (Signed)
Previously filled by Dr. French Ana. Is it okay to refill?

## 2022-12-08 NOTE — Telephone Encounter (Signed)
Well care forms that just need review and initials for scan has been placed in provider to be reviewed folder

## 2022-12-10 NOTE — Progress Notes (Signed)
[name], [name] ([id number]) [id number] Page 1 of 10 Visit Report for 12/08/2022 Arrival Information Details Patient Name: Date of Service: [name], [place] 12/08/2022 8:15 A M Medical Record Number: [id number] Patient Account Number: [id number] Date of Birth/Sex: Treating RN: September 27, 1950 (72 y.o. [name] Primary Care Sativa Gelles: [name] Other Clinician: Betha Loa Referring Chevie Birkhead: Treating Kinston Magnan/Extender: Amedeo Kinsman in Treatment: 9 Visit Information History Since Last Visit All ordered tests and consults were completed: No Patient Arrived: Wheel Chair Added or deleted any medications: No Arrival Time: 08:17 Any new allergies or adverse reactions: No Transfer Assistance: EasyPivot Patient Lift Had a fall or experienced change in No Patient Identification Verified: Yes activities of daily living that may affect Secondary Verification Process Completed: Yes risk of falls: Patient Requires Transmission-Based Precautions: No Signs or symptoms of abuse/neglect since last visito No Patient Has Alerts: Yes Hospitalized since last visit: No Patient Alerts: Patient on Blood Thinner Implantable device outside of the clinic excluding No ABI L 1.1 02/05/22 cellular tissue based products placed in the center ABI R 1.12 02/05/22 since last visit: Has Dressing in Place as Prescribed: Yes Has Compression in Place as Prescribed: Yes Pain Present Now: Yes Electronic Signature(s) Signed: 12/08/2022 5:02:15 PM By: Betha Loa Entered By: Betha Loa on 12/08/2022 08:17:43 -------------------------------------------------------------------------------- Clinic Level of Care Assessment Details Patient Name: Date of Service: [name] 12/08/2022 8:15 A M Medical Record Number: [id number] Patient Account Number: [id number] Date of Birth/Sex: Treating RN: [birth year] (72 y.o. [name] Primary Care Mccade Sullenberger:  [name] Other Clinician: Betha Loa Referring Ranita Stjulien: Treating Cherre Kothari/Extender: Algis Greenhouse Weeks in Treatment: 9 Clinic Level of Care Assessment Items TOOL 4 Quantity Score []  - 0 Use when only an EandM is performed on FOLLOW-UP visit ASSESSMENTS - Nursing Assessment / Reassessment X- 1 10 Reassessment of Co-morbidities (includes updates in patient status) [name], [name] ([id number]) [id number] Page 2 of 10 X- 1 5 Reassessment of Adherence to Treatment Plan ASSESSMENTS - Wound and Skin A ssessment / Reassessment X - Simple Wound Assessment / Reassessment - one wound 1 5 []  - 0 Complex Wound Assessment / Reassessment - multiple wounds []  - 0 Dermatologic / Skin Assessment (not related to wound area) ASSESSMENTS - Focused Assessment []  - 0 Circumferential Edema Measurements - multi extremities []  - 0 Nutritional Assessment / Counseling / Intervention []  - 0 Lower Extremity Assessment (monofilament, tuning fork, pulses) []  - 0 Peripheral Arterial Disease Assessment (using hand held doppler) ASSESSMENTS - Ostomy and/or Continence Assessment and Care []  - 0 Incontinence Assessment and Management []  - 0 Ostomy Care Assessment and Management (repouching, etc.) PROCESS - Coordination of Care X - Simple Patient / Family Education for ongoing care 1 15 []  - 0 Complex (extensive) Patient / Family Education for ongoing care []  - 0 Staff obtains Chiropractor, Records, T Results / Process Orders est []  - 0 Staff telephones HHA, Nursing Homes / Clarify orders / etc []  - 0 Routine Transfer to another Facility (non-emergent condition) []  - 0 Routine Hospital Admission (non-emergent condition) []  - 0 New Admissions / Manufacturing engineer / Ordering NPWT Apligraf, etc. , []  - 0 Emergency Hospital Admission (emergent condition) X- 1 10 Simple Discharge Coordination []  - 0 Complex (extensive) Discharge Coordination PROCESS -  Special Needs []  - 0 Pediatric / Minor Patient Management []  - 0 Isolation Patient Management []  - 0 Hearing / Language / Visual special needs []  - 0 Assessment of  [name], [name] ([id number]) [id number] Page 1 of 10 Visit Report for 12/08/2022 Arrival Information Details Patient Name: Date of Service: [name], [place] 12/08/2022 8:15 A M Medical Record Number: [id number] Patient Account Number: [id number] Date of Birth/Sex: Treating RN: September 27, 1950 (72 y.o. [name] Primary Care Sativa Gelles: [name] Other Clinician: Betha Loa Referring Chevie Birkhead: Treating Kinston Magnan/Extender: Amedeo Kinsman in Treatment: 9 Visit Information History Since Last Visit All ordered tests and consults were completed: No Patient Arrived: Wheel Chair Added or deleted any medications: No Arrival Time: 08:17 Any new allergies or adverse reactions: No Transfer Assistance: EasyPivot Patient Lift Had a fall or experienced change in No Patient Identification Verified: Yes activities of daily living that may affect Secondary Verification Process Completed: Yes risk of falls: Patient Requires Transmission-Based Precautions: No Signs or symptoms of abuse/neglect since last visito No Patient Has Alerts: Yes Hospitalized since last visit: No Patient Alerts: Patient on Blood Thinner Implantable device outside of the clinic excluding No ABI L 1.1 02/05/22 cellular tissue based products placed in the center ABI R 1.12 02/05/22 since last visit: Has Dressing in Place as Prescribed: Yes Has Compression in Place as Prescribed: Yes Pain Present Now: Yes Electronic Signature(s) Signed: 12/08/2022 5:02:15 PM By: Betha Loa Entered By: Betha Loa on 12/08/2022 08:17:43 -------------------------------------------------------------------------------- Clinic Level of Care Assessment Details Patient Name: Date of Service: [name] 12/08/2022 8:15 A M Medical Record Number: [id number] Patient Account Number: [id number] Date of Birth/Sex: Treating RN: [birth year] (72 y.o. [name] Primary Care Mccade Sullenberger:  [name] Other Clinician: Betha Loa Referring Ranita Stjulien: Treating Cherre Kothari/Extender: Algis Greenhouse Weeks in Treatment: 9 Clinic Level of Care Assessment Items TOOL 4 Quantity Score []  - 0 Use when only an EandM is performed on FOLLOW-UP visit ASSESSMENTS - Nursing Assessment / Reassessment X- 1 10 Reassessment of Co-morbidities (includes updates in patient status) [name], [name] ([id number]) [id number] Page 2 of 10 X- 1 5 Reassessment of Adherence to Treatment Plan ASSESSMENTS - Wound and Skin A ssessment / Reassessment X - Simple Wound Assessment / Reassessment - one wound 1 5 []  - 0 Complex Wound Assessment / Reassessment - multiple wounds []  - 0 Dermatologic / Skin Assessment (not related to wound area) ASSESSMENTS - Focused Assessment []  - 0 Circumferential Edema Measurements - multi extremities []  - 0 Nutritional Assessment / Counseling / Intervention []  - 0 Lower Extremity Assessment (monofilament, tuning fork, pulses) []  - 0 Peripheral Arterial Disease Assessment (using hand held doppler) ASSESSMENTS - Ostomy and/or Continence Assessment and Care []  - 0 Incontinence Assessment and Management []  - 0 Ostomy Care Assessment and Management (repouching, etc.) PROCESS - Coordination of Care X - Simple Patient / Family Education for ongoing care 1 15 []  - 0 Complex (extensive) Patient / Family Education for ongoing care []  - 0 Staff obtains Chiropractor, Records, T Results / Process Orders est []  - 0 Staff telephones HHA, Nursing Homes / Clarify orders / etc []  - 0 Routine Transfer to another Facility (non-emergent condition) []  - 0 Routine Hospital Admission (non-emergent condition) []  - 0 New Admissions / Manufacturing engineer / Ordering NPWT Apligraf, etc. , []  - 0 Emergency Hospital Admission (emergent condition) X- 1 10 Simple Discharge Coordination []  - 0 Complex (extensive) Discharge Coordination PROCESS -  Special Needs []  - 0 Pediatric / Minor Patient Management []  - 0 Isolation Patient Management []  - 0 Hearing / Language / Visual special needs []  - 0 Assessment of  [name], [name] ([id number]) [id number] Page 1 of 10 Visit Report for 12/08/2022 Arrival Information Details Patient Name: Date of Service: [name], [place] 12/08/2022 8:15 A M Medical Record Number: [id number] Patient Account Number: [id number] Date of Birth/Sex: Treating RN: September 27, 1950 (72 y.o. [name] Primary Care Sativa Gelles: [name] Other Clinician: Betha Loa Referring Chevie Birkhead: Treating Kinston Magnan/Extender: Amedeo Kinsman in Treatment: 9 Visit Information History Since Last Visit All ordered tests and consults were completed: No Patient Arrived: Wheel Chair Added or deleted any medications: No Arrival Time: 08:17 Any new allergies or adverse reactions: No Transfer Assistance: EasyPivot Patient Lift Had a fall or experienced change in No Patient Identification Verified: Yes activities of daily living that may affect Secondary Verification Process Completed: Yes risk of falls: Patient Requires Transmission-Based Precautions: No Signs or symptoms of abuse/neglect since last visito No Patient Has Alerts: Yes Hospitalized since last visit: No Patient Alerts: Patient on Blood Thinner Implantable device outside of the clinic excluding No ABI L 1.1 02/05/22 cellular tissue based products placed in the center ABI R 1.12 02/05/22 since last visit: Has Dressing in Place as Prescribed: Yes Has Compression in Place as Prescribed: Yes Pain Present Now: Yes Electronic Signature(s) Signed: 12/08/2022 5:02:15 PM By: Betha Loa Entered By: Betha Loa on 12/08/2022 08:17:43 -------------------------------------------------------------------------------- Clinic Level of Care Assessment Details Patient Name: Date of Service: [name] 12/08/2022 8:15 A M Medical Record Number: [id number] Patient Account Number: [id number] Date of Birth/Sex: Treating RN: [birth year] (72 y.o. [name] Primary Care Mccade Sullenberger:  [name] Other Clinician: Betha Loa Referring Ranita Stjulien: Treating Cherre Kothari/Extender: Algis Greenhouse Weeks in Treatment: 9 Clinic Level of Care Assessment Items TOOL 4 Quantity Score []  - 0 Use when only an EandM is performed on FOLLOW-UP visit ASSESSMENTS - Nursing Assessment / Reassessment X- 1 10 Reassessment of Co-morbidities (includes updates in patient status) [name], [name] ([id number]) [id number] Page 2 of 10 X- 1 5 Reassessment of Adherence to Treatment Plan ASSESSMENTS - Wound and Skin A ssessment / Reassessment X - Simple Wound Assessment / Reassessment - one wound 1 5 []  - 0 Complex Wound Assessment / Reassessment - multiple wounds []  - 0 Dermatologic / Skin Assessment (not related to wound area) ASSESSMENTS - Focused Assessment []  - 0 Circumferential Edema Measurements - multi extremities []  - 0 Nutritional Assessment / Counseling / Intervention []  - 0 Lower Extremity Assessment (monofilament, tuning fork, pulses) []  - 0 Peripheral Arterial Disease Assessment (using hand held doppler) ASSESSMENTS - Ostomy and/or Continence Assessment and Care []  - 0 Incontinence Assessment and Management []  - 0 Ostomy Care Assessment and Management (repouching, etc.) PROCESS - Coordination of Care X - Simple Patient / Family Education for ongoing care 1 15 []  - 0 Complex (extensive) Patient / Family Education for ongoing care []  - 0 Staff obtains Chiropractor, Records, T Results / Process Orders est []  - 0 Staff telephones HHA, Nursing Homes / Clarify orders / etc []  - 0 Routine Transfer to another Facility (non-emergent condition) []  - 0 Routine Hospital Admission (non-emergent condition) []  - 0 New Admissions / Manufacturing engineer / Ordering NPWT Apligraf, etc. , []  - 0 Emergency Hospital Admission (emergent condition) X- 1 10 Simple Discharge Coordination []  - 0 Complex (extensive) Discharge Coordination PROCESS -  Special Needs []  - 0 Pediatric / Minor Patient Management []  - 0 Isolation Patient Management []  - 0 Hearing / Language / Visual special needs []  - 0 Assessment of  Service: [name], [place] 12/08/2022 8:15 A M Medical Record Number: [id number] Patient Account Number: [id number] Date of Birth/Sex: Treating RN: [birth year] (72 y.o. [name] Primary Care Karri Kallenbach: [name] Other Clinician: Betha Loa Referring Brandyn Lowrey: Treating Zavier Canela/Extender: Algis Greenhouse Weeks in Treatment: 9 Vital Signs Time Taken: 08:18 Temperature (F): 98.1 Height (in): 64 Pulse (bpm): 85 Weight (lbs): 250 Respiratory Rate (breaths/min): 18 Body Mass Index (BMI): 42.9 Blood Pressure (mmHg): 126/79 Reference Range: 80 - 120 mg / dl Electronic Signature(s) Signed: 12/08/2022 5:02:15 PM By: Bronson Ing ([id number]) PM By: Olive Bass.pdf Page 10 of 10 Signed: 12/08/2022 5:02:15 Entered By: Betha Loa on 12/08/2022 08:20:32  [name], [name] ([id number]) [id number] Page 1 of 10 Visit Report for 12/08/2022 Arrival Information Details Patient Name: Date of Service: [name], [place] 12/08/2022 8:15 A M Medical Record Number: [id number] Patient Account Number: [id number] Date of Birth/Sex: Treating RN: September 27, 1950 (72 y.o. [name] Primary Care Sativa Gelles: [name] Other Clinician: Betha Loa Referring Chevie Birkhead: Treating Kinston Magnan/Extender: Amedeo Kinsman in Treatment: 9 Visit Information History Since Last Visit All ordered tests and consults were completed: No Patient Arrived: Wheel Chair Added or deleted any medications: No Arrival Time: 08:17 Any new allergies or adverse reactions: No Transfer Assistance: EasyPivot Patient Lift Had a fall or experienced change in No Patient Identification Verified: Yes activities of daily living that may affect Secondary Verification Process Completed: Yes risk of falls: Patient Requires Transmission-Based Precautions: No Signs or symptoms of abuse/neglect since last visito No Patient Has Alerts: Yes Hospitalized since last visit: No Patient Alerts: Patient on Blood Thinner Implantable device outside of the clinic excluding No ABI L 1.1 02/05/22 cellular tissue based products placed in the center ABI R 1.12 02/05/22 since last visit: Has Dressing in Place as Prescribed: Yes Has Compression in Place as Prescribed: Yes Pain Present Now: Yes Electronic Signature(s) Signed: 12/08/2022 5:02:15 PM By: Betha Loa Entered By: Betha Loa on 12/08/2022 08:17:43 -------------------------------------------------------------------------------- Clinic Level of Care Assessment Details Patient Name: Date of Service: [name] 12/08/2022 8:15 A M Medical Record Number: [id number] Patient Account Number: [id number] Date of Birth/Sex: Treating RN: [birth year] (72 y.o. [name] Primary Care Mccade Sullenberger:  [name] Other Clinician: Betha Loa Referring Ranita Stjulien: Treating Cherre Kothari/Extender: Algis Greenhouse Weeks in Treatment: 9 Clinic Level of Care Assessment Items TOOL 4 Quantity Score []  - 0 Use when only an EandM is performed on FOLLOW-UP visit ASSESSMENTS - Nursing Assessment / Reassessment X- 1 10 Reassessment of Co-morbidities (includes updates in patient status) [name], [name] ([id number]) [id number] Page 2 of 10 X- 1 5 Reassessment of Adherence to Treatment Plan ASSESSMENTS - Wound and Skin A ssessment / Reassessment X - Simple Wound Assessment / Reassessment - one wound 1 5 []  - 0 Complex Wound Assessment / Reassessment - multiple wounds []  - 0 Dermatologic / Skin Assessment (not related to wound area) ASSESSMENTS - Focused Assessment []  - 0 Circumferential Edema Measurements - multi extremities []  - 0 Nutritional Assessment / Counseling / Intervention []  - 0 Lower Extremity Assessment (monofilament, tuning fork, pulses) []  - 0 Peripheral Arterial Disease Assessment (using hand held doppler) ASSESSMENTS - Ostomy and/or Continence Assessment and Care []  - 0 Incontinence Assessment and Management []  - 0 Ostomy Care Assessment and Management (repouching, etc.) PROCESS - Coordination of Care X - Simple Patient / Family Education for ongoing care 1 15 []  - 0 Complex (extensive) Patient / Family Education for ongoing care []  - 0 Staff obtains Chiropractor, Records, T Results / Process Orders est []  - 0 Staff telephones HHA, Nursing Homes / Clarify orders / etc []  - 0 Routine Transfer to another Facility (non-emergent condition) []  - 0 Routine Hospital Admission (non-emergent condition) []  - 0 New Admissions / Manufacturing engineer / Ordering NPWT Apligraf, etc. , []  - 0 Emergency Hospital Admission (emergent condition) X- 1 10 Simple Discharge Coordination []  - 0 Complex (extensive) Discharge Coordination PROCESS -  Special Needs []  - 0 Pediatric / Minor Patient Management []  - 0 Isolation Patient Management []  - 0 Hearing / Language / Visual special needs []  - 0 Assessment of

## 2022-12-11 NOTE — Telephone Encounter (Signed)
Forms placed in scan folder

## 2022-12-12 DIAGNOSIS — G4733 Obstructive sleep apnea (adult) (pediatric): Secondary | ICD-10-CM | POA: Diagnosis not present

## 2022-12-12 DIAGNOSIS — R0681 Apnea, not elsewhere classified: Secondary | ICD-10-CM | POA: Diagnosis not present

## 2022-12-12 DIAGNOSIS — I10 Essential (primary) hypertension: Secondary | ICD-10-CM | POA: Diagnosis not present

## 2022-12-12 DIAGNOSIS — F32A Depression, unspecified: Secondary | ICD-10-CM | POA: Diagnosis not present

## 2022-12-12 DIAGNOSIS — F419 Anxiety disorder, unspecified: Secondary | ICD-10-CM | POA: Diagnosis not present

## 2022-12-12 DIAGNOSIS — G4700 Insomnia, unspecified: Secondary | ICD-10-CM | POA: Diagnosis not present

## 2022-12-15 ENCOUNTER — Ambulatory Visit: Payer: Medicare HMO | Admitting: Physician Assistant

## 2022-12-15 DIAGNOSIS — H524 Presbyopia: Secondary | ICD-10-CM | POA: Diagnosis not present

## 2022-12-15 DIAGNOSIS — E113293 Type 2 diabetes mellitus with mild nonproliferative diabetic retinopathy without macular edema, bilateral: Secondary | ICD-10-CM | POA: Diagnosis not present

## 2022-12-15 DIAGNOSIS — H52223 Regular astigmatism, bilateral: Secondary | ICD-10-CM | POA: Diagnosis not present

## 2022-12-15 DIAGNOSIS — H2513 Age-related nuclear cataract, bilateral: Secondary | ICD-10-CM | POA: Diagnosis not present

## 2022-12-16 ENCOUNTER — Encounter: Payer: Self-pay | Admitting: Orthopedic Surgery

## 2022-12-16 ENCOUNTER — Telehealth: Payer: Self-pay

## 2022-12-16 ENCOUNTER — Other Ambulatory Visit: Payer: Self-pay

## 2022-12-16 DIAGNOSIS — Z1231 Encounter for screening mammogram for malignant neoplasm of breast: Secondary | ICD-10-CM

## 2022-12-16 LAB — HM DIABETES EYE EXAM

## 2022-12-16 NOTE — Telephone Encounter (Signed)
Called pt to inform her of her overdue mammogram notification. Pt verbalized understanding and stated she will call when she can to [place] to schedule pt stated she has the phone number

## 2022-12-21 DIAGNOSIS — M25512 Pain in left shoulder: Secondary | ICD-10-CM | POA: Diagnosis not present

## 2022-12-21 DIAGNOSIS — Z96612 Presence of left artificial shoulder joint: Secondary | ICD-10-CM | POA: Diagnosis not present

## 2022-12-23 DIAGNOSIS — Z96612 Presence of left artificial shoulder joint: Secondary | ICD-10-CM | POA: Diagnosis not present

## 2022-12-23 DIAGNOSIS — E1151 Type 2 diabetes mellitus with diabetic peripheral angiopathy without gangrene: Secondary | ICD-10-CM | POA: Diagnosis not present

## 2022-12-23 DIAGNOSIS — L89623 Pressure ulcer of left heel, stage 3: Secondary | ICD-10-CM | POA: Diagnosis not present

## 2022-12-23 DIAGNOSIS — G8929 Other chronic pain: Secondary | ICD-10-CM | POA: Diagnosis not present

## 2022-12-23 DIAGNOSIS — M25512 Pain in left shoulder: Secondary | ICD-10-CM | POA: Diagnosis not present

## 2022-12-23 DIAGNOSIS — Z6841 Body Mass Index (BMI) 40.0 and over, adult: Secondary | ICD-10-CM | POA: Diagnosis not present

## 2022-12-28 DIAGNOSIS — Z96612 Presence of left artificial shoulder joint: Secondary | ICD-10-CM | POA: Diagnosis not present

## 2022-12-28 DIAGNOSIS — M25512 Pain in left shoulder: Secondary | ICD-10-CM | POA: Diagnosis not present

## 2023-01-05 DIAGNOSIS — M25512 Pain in left shoulder: Secondary | ICD-10-CM | POA: Diagnosis not present

## 2023-01-05 DIAGNOSIS — Z96612 Presence of left artificial shoulder joint: Secondary | ICD-10-CM | POA: Diagnosis not present

## 2023-01-07 ENCOUNTER — Ambulatory Visit: Payer: Medicare HMO | Admitting: Nurse Practitioner

## 2023-01-11 ENCOUNTER — Encounter: Payer: Medicare HMO | Attending: Physician Assistant | Admitting: Physician Assistant

## 2023-01-11 DIAGNOSIS — Z96612 Presence of left artificial shoulder joint: Secondary | ICD-10-CM | POA: Diagnosis not present

## 2023-01-11 DIAGNOSIS — Z7901 Long term (current) use of anticoagulants: Secondary | ICD-10-CM | POA: Insufficient documentation

## 2023-01-11 DIAGNOSIS — L89623 Pressure ulcer of left heel, stage 3: Secondary | ICD-10-CM | POA: Diagnosis not present

## 2023-01-11 DIAGNOSIS — I480 Paroxysmal atrial fibrillation: Secondary | ICD-10-CM | POA: Diagnosis not present

## 2023-01-11 DIAGNOSIS — F419 Anxiety disorder, unspecified: Secondary | ICD-10-CM | POA: Diagnosis not present

## 2023-01-11 DIAGNOSIS — G4700 Insomnia, unspecified: Secondary | ICD-10-CM | POA: Diagnosis not present

## 2023-01-11 DIAGNOSIS — G4733 Obstructive sleep apnea (adult) (pediatric): Secondary | ICD-10-CM | POA: Diagnosis not present

## 2023-01-11 DIAGNOSIS — E11621 Type 2 diabetes mellitus with foot ulcer: Secondary | ICD-10-CM | POA: Insufficient documentation

## 2023-01-11 DIAGNOSIS — I87323 Chronic venous hypertension (idiopathic) with inflammation of bilateral lower extremity: Secondary | ICD-10-CM | POA: Insufficient documentation

## 2023-01-11 DIAGNOSIS — E1140 Type 2 diabetes mellitus with diabetic neuropathy, unspecified: Secondary | ICD-10-CM | POA: Insufficient documentation

## 2023-01-11 DIAGNOSIS — R0681 Apnea, not elsewhere classified: Secondary | ICD-10-CM | POA: Diagnosis not present

## 2023-01-11 DIAGNOSIS — L89616 Pressure-induced deep tissue damage of right heel: Secondary | ICD-10-CM | POA: Insufficient documentation

## 2023-01-11 DIAGNOSIS — F32A Depression, unspecified: Secondary | ICD-10-CM | POA: Diagnosis not present

## 2023-01-11 DIAGNOSIS — I872 Venous insufficiency (chronic) (peripheral): Secondary | ICD-10-CM | POA: Diagnosis not present

## 2023-01-11 DIAGNOSIS — I10 Essential (primary) hypertension: Secondary | ICD-10-CM | POA: Insufficient documentation

## 2023-01-11 DIAGNOSIS — Z86718 Personal history of other venous thrombosis and embolism: Secondary | ICD-10-CM | POA: Diagnosis not present

## 2023-01-11 NOTE — Progress Notes (Addendum)
this point I think that she is making headway towards getting this closed although I do believe there is quite a bit of slough and biofilm skin need to be cleaned off just a little bit of callus around the edges of the wound. 11-10-2022 upon evaluation today patient appears to be doing well currently in regard to her heel ulcer. She has been tolerating the dressing changes without complication and in general I feel like she is making excellent headway towards complete closure. 8/27; patient has a wound on the left heel near the tip. Using Promogran and Zituvimet. Comes in with open heeled  shoes however it has a lip that encompasses the wound area which is on the distal part of the Achilles area. 12-08-2022 upon evaluation today patient appears to be doing well currently in regard to her wound on the foot which is actually doing excellent. This heel is measuring about 60% smaller than last time I saw her. Fortunately I think that she is really doing quite well at this time. 01-11-2023 upon evaluation today patient appears to be doing well currently in regard to her wound. She has been tolerating the dressing changes without complication. With that being said her heel actually seems to be doing well. She is inquiring as to whether or not she can do her 2-week follow-up. Electronic Signature(s) Signed: 01/11/2023 3:56:24 PM By: Allen Derry PA-C Entered By: Allen Derry on 01/11/2023 15:56:24 -------------------------------------------------------------------------------- Physical Exam Details Patient Name: Date of Service: [name] 01/11/2023 2:45 PM Medical Record Number: [id number] Patient Account Number: [id number] Date of Birth/Sex: Treating RN: [birth year] (72 y.o. [name] Primary Care Provider: [name] Other Clinician: Betha Loa Referring Provider: Treating Provider/Extender: Algis Greenhouse Weeks in Treatment: 14 Constitutional Well-nourished and well-hydrated in no acute distress. Respiratory normal breathing without difficulty. [name] ([id number]) [id number] Page 4 of 9 Psychiatric this patient is able to make decisions and demonstrates good insight into disease process. Alert and Oriented x 3. pleasant and cooperative. Notes Upon inspection patient's wound bed actually showed signs of good granulation and epithelization at this point. Fortunately I do not see any signs of worsening overall. I am pleased with where we stand and I do believe that the patient is making headway towards closure  which is great news. Electronic Signature(s) Signed: 01/11/2023 3:56:35 PM By: Allen Derry PA-C Entered By: Allen Derry on 01/11/2023 15:56:35 -------------------------------------------------------------------------------- Physician Orders Details Patient Name: Date of Service: [name], [place] 01/11/2023 2:45 PM Medical Record Number: [id number] Patient Account Number: [id number] Date of Birth/Sex: Treating RN: [birth year] (72 y.o. [name] Primary Care Provider: [name] Other Clinician: Betha Loa Referring Provider: Treating Provider/Extender: Algis Greenhouse Weeks in Treatment: 14 The following information was scribed by: Betha Loa The information was scribed for: Allen Derry Verbal / Phone Orders: Yes Clinician: Angelina Pih Read Back and Verified: Yes Diagnosis Coding ICD-10 Coding Code Description E11.621 Type 2 diabetes mellitus with foot ulcer I87.323 Chronic venous hypertension (idiopathic) with inflammation of bilateral lower extremity L89.623 Pressure ulcer of left heel, stage 3 L89.616 Pressure-induced deep tissue damage of right heel Z79.01 Long term (current) use of anticoagulants I48.0 Paroxysmal atrial fibrillation Follow-up Appointments Return Appointment in 1 week. Home Health [facility] for wound care. May utilize formulary equivalent dressing for wound treatment orders unless otherwise specified. Home Health Nurse may visit PRN to address patients wound care needs. [name] for wound care to left calcan [phone]  Anesthetic (Use 'Patient Medications' Section for Anesthetic Order Entry) Lidocaine applied to wound bed Edema Control - Lymphedema / Segmental Compressive Device / Other Elevate, Exercise Daily and A void Standing for Long Periods of Time. Elevate legs to the level of the heart and pump ankles as often as possible Elevate leg(s) parallel to the floor when sitting. Wound Treatment Wound #24  - Calcaneus Wound Laterality: Left Cleanser: Normal Saline Every Other Day/30 Days Discharge Instructions: Wash your hands with soap and water. Remove old dressing, discard into plastic bag and place into trash. Cleanse the wound with Normal Saline prior to applying a clean dressing using gauze sponges, not tissues or cotton balls. Do not scrub or use excessive force. Pat dry using gauze sponges, not tissue or cotton balls. Cleanser: Soap and Water Every Other Day/30 Days Discharge Instructions: Gently cleanse wound with antibacterial soap, rinse and pat dry prior to dressing wounds Prim Dressing: Promogran Matrix 4.34 (in) (DME) (Dispense As Written) Every Other Day/30 Days ary Discharge Instructions: Moisten w/normal saline or sterile water; Cover wound as directed. Do not remove from wound bed. [name] ([id number]) [id number] Page 5 of 9 Secondary Dressing: Coverlet Latex-Free Fabric Adhesive Dressings Every Other Day/30 Days Discharge Instructions: 1.5 x 2 Electronic Signature(s) Signed: 01/14/2023 6:30:03 PM By: Allen Derry PA-C Signed: 01/15/2023 10:28:30 AM By: Betha Loa Previous Signature: 01/11/2023 4:50:43 PM Version By: Allen Derry PA-C Previous Signature: 01/12/2023 4:53:28 PM Version By: Betha Loa Entered By: Betha Loa on 01/14/2023 17:09:50 -------------------------------------------------------------------------------- Problem List Details Patient Name: Date of Service: [name] 01/11/2023 2:45 PM Medical Record Number: [id number] Patient Account Number: [id number] Date of Birth/Sex: Treating RN: [birth year] (72 y.o. [name] Primary Care Provider: [name] Other Clinician: Betha Loa Referring Provider: Treating Provider/Extender: Algis Greenhouse Weeks in Treatment: 14 Active Problems ICD-10 Encounter Code Description Active Date MDM Diagnosis E11.621 Type 2 diabetes mellitus  with foot ulcer 10/01/2022 No Yes I87.323 Chronic venous hypertension (idiopathic) with inflammation of bilateral lower 10/01/2022 No Yes extremity L89.623 Pressure ulcer of left heel, stage 3 10/01/2022 No Yes L89.616 Pressure-induced deep tissue damage of right heel 10/01/2022 No Yes Z79.01 Long term (current) use of anticoagulants 10/01/2022 No Yes I48.0 Paroxysmal atrial fibrillation 10/01/2022 No Yes Inactive Problems Resolved Problems Electronic Signature(s) Signed: 01/11/2023 3:10:42 PM By: Allen Derry PA-C Entered By: Allen Derry on 01/11/2023 15:10:42 Trudee Grip B ([id number]) [id number] Page 6 of 9 -------------------------------------------------------------------------------- Progress Note Details Patient Name: Date of Service: [name] 01/11/2023 2:45 PM Medical Record Number: [id number] Patient Account Number: [id number] Date of Birth/Sex: Treating RN: 1951/01/20 (72 y.o. [name] Primary Care Provider: [name] Other Clinician: Betha Loa Referring Provider: Treating Provider/Extender: Algis Greenhouse Weeks in Treatment: 14 Subjective Chief Complaint Information obtained from Patient Bilateral heel ulcers History of Present Illness (HPI) 06/28/20 upon evaluation today patient appears to be doing unfortunately somewhat poorly in regard to wounds that she has over her lower extremities. She has a wound on the left distal second toe, left posterior heel, and right anterior lower leg. With that being said all in all none of the wounds appear to be too bad the one that hurts her the most is actually the wound on the posterior heel which actually is also one of the smallest. Nonetheless I think there is some callus hiding what is really going on in this area. Fortunately there does not appear to be any obvious evidence of infection  anything like that. We will see patient back for reevaluation in 1 week here in the clinic. If anything worsens or changes patient will contact our office for additional recommendations. Electronic Signature(s) Signed: 01/11/2023 3:57:18 PM By: Allen Derry PA-C Entered By: Allen Derry on 01/11/2023 15:57:18 -------------------------------------------------------------------------------- SuperBill Details Patient Name: Date of Service: [name], [place] 01/11/2023 Medical Record Number: [id number] Patient Account Number: [id number] Date of Birth/Sex: Treating RN: 03/24/1950 (72 y.o. [name] Primary Care Provider: [name] Other Clinician: Betha Loa Referring Provider: Treating Provider/Extender: Algis Greenhouse Weeks in Treatment: 14 Diagnosis Coding ICD-10 Codes Code Description E11.621 Type 2 diabetes mellitus with foot ulcer I87.323 Chronic venous hypertension (idiopathic) with inflammation of bilateral lower extremity L89.623 Pressure ulcer of left  heel, stage 3 L89.616 Pressure-induced deep tissue damage of right heel Z79.01 Long term (current) use of anticoagulants I48.0 Paroxysmal atrial fibrillation Facility Procedures : CPT4 Code: 32951884 Description: 99213 - WOUND CARE VISIT-LEV 3 EST PT Modifier: Quantity: 1 Physician Procedures : CPT4 Code Description Modifier [id number] 99213 - WC PHYS LEVEL 3 - EST PT ICD-10 Diagnosis Description E11.621 Type 2 diabetes mellitus with foot ulcer I87.323 Chronic venous hypertension (idiopathic) with inflammation of bilateral lower extremity  L89.623 Pressure ulcer of left heel, stage 3 L89.616 Pressure-induced deep tissue damage of right heel Quantity: 1 Electronic Signature(s) Signed: 01/11/2023 3:57:30 PM By: Allen Derry PA-C Entered By: Allen Derry on 01/11/2023 15:57:30  anything like that. We will see patient back for reevaluation in 1 week here in the clinic. If anything worsens or changes patient will contact our office for additional recommendations. Electronic Signature(s) Signed: 01/11/2023 3:57:18 PM By: Allen Derry PA-C Entered By: Allen Derry on 01/11/2023 15:57:18 -------------------------------------------------------------------------------- SuperBill Details Patient Name: Date of Service: [name], [place] 01/11/2023 Medical Record Number: [id number] Patient Account Number: [id number] Date of Birth/Sex: Treating RN: 03/24/1950 (72 y.o. [name] Primary Care Provider: [name] Other Clinician: Betha Loa Referring Provider: Treating Provider/Extender: Algis Greenhouse Weeks in Treatment: 14 Diagnosis Coding ICD-10 Codes Code Description E11.621 Type 2 diabetes mellitus with foot ulcer I87.323 Chronic venous hypertension (idiopathic) with inflammation of bilateral lower extremity L89.623 Pressure ulcer of left  heel, stage 3 L89.616 Pressure-induced deep tissue damage of right heel Z79.01 Long term (current) use of anticoagulants I48.0 Paroxysmal atrial fibrillation Facility Procedures : CPT4 Code: 32951884 Description: 99213 - WOUND CARE VISIT-LEV 3 EST PT Modifier: Quantity: 1 Physician Procedures : CPT4 Code Description Modifier [id number] 99213 - WC PHYS LEVEL 3 - EST PT ICD-10 Diagnosis Description E11.621 Type 2 diabetes mellitus with foot ulcer I87.323 Chronic venous hypertension (idiopathic) with inflammation of bilateral lower extremity  L89.623 Pressure ulcer of left heel, stage 3 L89.616 Pressure-induced deep tissue damage of right heel Quantity: 1 Electronic Signature(s) Signed: 01/11/2023 3:57:30 PM By: Allen Derry PA-C Entered By: Allen Derry on 01/11/2023 15:57:30  this point I think that she is making headway towards getting this closed although I do believe there is quite a bit of slough and biofilm skin need to be cleaned off just a little bit of callus around the edges of the wound. 11-10-2022 upon evaluation today patient appears to be doing well currently in regard to her heel ulcer. She has been tolerating the dressing changes without complication and in general I feel like she is making excellent headway towards complete closure. 8/27; patient has a wound on the left heel near the tip. Using Promogran and Zituvimet. Comes in with open heeled  shoes however it has a lip that encompasses the wound area which is on the distal part of the Achilles area. 12-08-2022 upon evaluation today patient appears to be doing well currently in regard to her wound on the foot which is actually doing excellent. This heel is measuring about 60% smaller than last time I saw her. Fortunately I think that she is really doing quite well at this time. 01-11-2023 upon evaluation today patient appears to be doing well currently in regard to her wound. She has been tolerating the dressing changes without complication. With that being said her heel actually seems to be doing well. She is inquiring as to whether or not she can do her 2-week follow-up. Electronic Signature(s) Signed: 01/11/2023 3:56:24 PM By: Allen Derry PA-C Entered By: Allen Derry on 01/11/2023 15:56:24 -------------------------------------------------------------------------------- Physical Exam Details Patient Name: Date of Service: [name] 01/11/2023 2:45 PM Medical Record Number: [id number] Patient Account Number: [id number] Date of Birth/Sex: Treating RN: [birth year] (72 y.o. [name] Primary Care Provider: [name] Other Clinician: Betha Loa Referring Provider: Treating Provider/Extender: Algis Greenhouse Weeks in Treatment: 14 Constitutional Well-nourished and well-hydrated in no acute distress. Respiratory normal breathing without difficulty. [name] ([id number]) [id number] Page 4 of 9 Psychiatric this patient is able to make decisions and demonstrates good insight into disease process. Alert and Oriented x 3. pleasant and cooperative. Notes Upon inspection patient's wound bed actually showed signs of good granulation and epithelization at this point. Fortunately I do not see any signs of worsening overall. I am pleased with where we stand and I do believe that the patient is making headway towards closure  which is great news. Electronic Signature(s) Signed: 01/11/2023 3:56:35 PM By: Allen Derry PA-C Entered By: Allen Derry on 01/11/2023 15:56:35 -------------------------------------------------------------------------------- Physician Orders Details Patient Name: Date of Service: [name], [place] 01/11/2023 2:45 PM Medical Record Number: [id number] Patient Account Number: [id number] Date of Birth/Sex: Treating RN: [birth year] (72 y.o. [name] Primary Care Provider: [name] Other Clinician: Betha Loa Referring Provider: Treating Provider/Extender: Algis Greenhouse Weeks in Treatment: 14 The following information was scribed by: Betha Loa The information was scribed for: Allen Derry Verbal / Phone Orders: Yes Clinician: Angelina Pih Read Back and Verified: Yes Diagnosis Coding ICD-10 Coding Code Description E11.621 Type 2 diabetes mellitus with foot ulcer I87.323 Chronic venous hypertension (idiopathic) with inflammation of bilateral lower extremity L89.623 Pressure ulcer of left heel, stage 3 L89.616 Pressure-induced deep tissue damage of right heel Z79.01 Long term (current) use of anticoagulants I48.0 Paroxysmal atrial fibrillation Follow-up Appointments Return Appointment in 1 week. Home Health [facility] for wound care. May utilize formulary equivalent dressing for wound treatment orders unless otherwise specified. Home Health Nurse may visit PRN to address patients wound care needs. [name] for wound care to left calcan [phone]  DARNETTE, KUSZ ([id number]) [id number] Page 1 of 9 Visit Report for 01/11/2023 Chief Complaint Document Details Patient Name: Date of Service: [name] 01/11/2023 2:45 PM Medical Record Number: [id number] Patient Account Number: [id number] Date of Birth/Sex: Treating RN: 1950/09/16 (72 y.o. [name] Primary Care Provider: [name] Other Clinician: Betha Loa Referring Provider: Treating Provider/Extender: Algis Greenhouse Weeks in Treatment: 14 Information Obtained from: Patient Chief Complaint Bilateral heel ulcers Electronic Signature(s) Signed: 01/11/2023 3:10:48 PM By: Allen Derry PA-C Entered By: Allen Derry on 01/11/2023 15:10:48 -------------------------------------------------------------------------------- HPI Details Patient Name: Date of Service: [name], [place] 01/11/2023 2:45 PM Medical Record Number: [id number] Patient Account Number: [id number] Date of Birth/Sex: Treating RN: 07-19-1950 (72 y.o. [name] Primary Care Provider: [name] Other Clinician: Betha Loa Referring Provider: Treating Provider/Extender: Algis Greenhouse Weeks in Treatment: 14 History of Present Illness HPI Description: 06/28/20 upon evaluation today patient appears to be doing unfortunately somewhat poorly in regard to wounds that she has over her lower extremities. She has a wound on the left distal second toe, left posterior heel, and right anterior lower leg. With that being said all in all none of the wounds appear to be too bad the one that hurts her the most is actually the wound on the posterior heel which actually is also one of the smallest. Nonetheless I think there is some callus hiding what is really going on in this area. Fortunately there does not appear to be any obvious evidence of infection which is great news. Patient does have a history of diabetes mellitus type 2, chronic venous  insufficiency, hypertension, history of DVT and is on long-term anticoagulant s, therapy, Eliquis. 07/05/20 upon evaluation today patient appears to be doing well currently with regard to her wounds. I feel like she is making progress which is great news and overall there is no signs of active infection at this time. No fevers, chills, nausea, vomiting, or diarrhea. 4/29; patient has a only a small area remaining on the tip of her left second toe and a small area remaining on the left heel. The area on the right anterior lower leg is healed. They tell me that she had remote podiatric surgery on the toes of the left foot however they are very fixed in position and I wonder whether they are making it difficult to relieve pressure in her foot wear. She is a type II diabetic with neuropathy as well. 08/01/2020 upon evaluation today patient appears to be doing well currently in regard to her wounds. In fact everything appears to be doing much better. The posterior aspect of her heel is actually giving her some trouble not the Achilles area but a small wound that we did not even have marked. In fact I had noted this before. Nonetheless we are going to addressed that today. 5/27; most of the patient's wounds are healed including the left Achilles heel left toe. Right forearm is also healed. She has a new abrasion posteriorly in the TUWANNA, POLANCO B ([id number]) [id number] Page 2 of 9 right elbow area just above the olecranon this happened today with an abrasion from her car door 6/15; the patient's wounds on the left Achilles and left second toe remain healed. The area on the right forearm is also just about healed in fact the skin I replaced last time is looking viable which is good. She had me look at the left heel again which is not in  DARNETTE, KUSZ ([id number]) [id number] Page 1 of 9 Visit Report for 01/11/2023 Chief Complaint Document Details Patient Name: Date of Service: [name] 01/11/2023 2:45 PM Medical Record Number: [id number] Patient Account Number: [id number] Date of Birth/Sex: Treating RN: 1950/09/16 (72 y.o. [name] Primary Care Provider: [name] Other Clinician: Betha Loa Referring Provider: Treating Provider/Extender: Algis Greenhouse Weeks in Treatment: 14 Information Obtained from: Patient Chief Complaint Bilateral heel ulcers Electronic Signature(s) Signed: 01/11/2023 3:10:48 PM By: Allen Derry PA-C Entered By: Allen Derry on 01/11/2023 15:10:48 -------------------------------------------------------------------------------- HPI Details Patient Name: Date of Service: [name], [place] 01/11/2023 2:45 PM Medical Record Number: [id number] Patient Account Number: [id number] Date of Birth/Sex: Treating RN: 07-19-1950 (72 y.o. [name] Primary Care Provider: [name] Other Clinician: Betha Loa Referring Provider: Treating Provider/Extender: Algis Greenhouse Weeks in Treatment: 14 History of Present Illness HPI Description: 06/28/20 upon evaluation today patient appears to be doing unfortunately somewhat poorly in regard to wounds that she has over her lower extremities. She has a wound on the left distal second toe, left posterior heel, and right anterior lower leg. With that being said all in all none of the wounds appear to be too bad the one that hurts her the most is actually the wound on the posterior heel which actually is also one of the smallest. Nonetheless I think there is some callus hiding what is really going on in this area. Fortunately there does not appear to be any obvious evidence of infection which is great news. Patient does have a history of diabetes mellitus type 2, chronic venous  insufficiency, hypertension, history of DVT and is on long-term anticoagulant s, therapy, Eliquis. 07/05/20 upon evaluation today patient appears to be doing well currently with regard to her wounds. I feel like she is making progress which is great news and overall there is no signs of active infection at this time. No fevers, chills, nausea, vomiting, or diarrhea. 4/29; patient has a only a small area remaining on the tip of her left second toe and a small area remaining on the left heel. The area on the right anterior lower leg is healed. They tell me that she had remote podiatric surgery on the toes of the left foot however they are very fixed in position and I wonder whether they are making it difficult to relieve pressure in her foot wear. She is a type II diabetic with neuropathy as well. 08/01/2020 upon evaluation today patient appears to be doing well currently in regard to her wounds. In fact everything appears to be doing much better. The posterior aspect of her heel is actually giving her some trouble not the Achilles area but a small wound that we did not even have marked. In fact I had noted this before. Nonetheless we are going to addressed that today. 5/27; most of the patient's wounds are healed including the left Achilles heel left toe. Right forearm is also healed. She has a new abrasion posteriorly in the TUWANNA, POLANCO B ([id number]) [id number] Page 2 of 9 right elbow area just above the olecranon this happened today with an abrasion from her car door 6/15; the patient's wounds on the left Achilles and left second toe remain healed. The area on the right forearm is also just about healed in fact the skin I replaced last time is looking viable which is good. She had me look at the left heel again which is not in  this point I think that she is making headway towards getting this closed although I do believe there is quite a bit of slough and biofilm skin need to be cleaned off just a little bit of callus around the edges of the wound. 11-10-2022 upon evaluation today patient appears to be doing well currently in regard to her heel ulcer. She has been tolerating the dressing changes without complication and in general I feel like she is making excellent headway towards complete closure. 8/27; patient has a wound on the left heel near the tip. Using Promogran and Zituvimet. Comes in with open heeled  shoes however it has a lip that encompasses the wound area which is on the distal part of the Achilles area. 12-08-2022 upon evaluation today patient appears to be doing well currently in regard to her wound on the foot which is actually doing excellent. This heel is measuring about 60% smaller than last time I saw her. Fortunately I think that she is really doing quite well at this time. 01-11-2023 upon evaluation today patient appears to be doing well currently in regard to her wound. She has been tolerating the dressing changes without complication. With that being said her heel actually seems to be doing well. She is inquiring as to whether or not she can do her 2-week follow-up. Electronic Signature(s) Signed: 01/11/2023 3:56:24 PM By: Allen Derry PA-C Entered By: Allen Derry on 01/11/2023 15:56:24 -------------------------------------------------------------------------------- Physical Exam Details Patient Name: Date of Service: [name] 01/11/2023 2:45 PM Medical Record Number: [id number] Patient Account Number: [id number] Date of Birth/Sex: Treating RN: [birth year] (72 y.o. [name] Primary Care Provider: [name] Other Clinician: Betha Loa Referring Provider: Treating Provider/Extender: Algis Greenhouse Weeks in Treatment: 14 Constitutional Well-nourished and well-hydrated in no acute distress. Respiratory normal breathing without difficulty. [name] ([id number]) [id number] Page 4 of 9 Psychiatric this patient is able to make decisions and demonstrates good insight into disease process. Alert and Oriented x 3. pleasant and cooperative. Notes Upon inspection patient's wound bed actually showed signs of good granulation and epithelization at this point. Fortunately I do not see any signs of worsening overall. I am pleased with where we stand and I do believe that the patient is making headway towards closure  which is great news. Electronic Signature(s) Signed: 01/11/2023 3:56:35 PM By: Allen Derry PA-C Entered By: Allen Derry on 01/11/2023 15:56:35 -------------------------------------------------------------------------------- Physician Orders Details Patient Name: Date of Service: [name], [place] 01/11/2023 2:45 PM Medical Record Number: [id number] Patient Account Number: [id number] Date of Birth/Sex: Treating RN: [birth year] (72 y.o. [name] Primary Care Provider: [name] Other Clinician: Betha Loa Referring Provider: Treating Provider/Extender: Algis Greenhouse Weeks in Treatment: 14 The following information was scribed by: Betha Loa The information was scribed for: Allen Derry Verbal / Phone Orders: Yes Clinician: Angelina Pih Read Back and Verified: Yes Diagnosis Coding ICD-10 Coding Code Description E11.621 Type 2 diabetes mellitus with foot ulcer I87.323 Chronic venous hypertension (idiopathic) with inflammation of bilateral lower extremity L89.623 Pressure ulcer of left heel, stage 3 L89.616 Pressure-induced deep tissue damage of right heel Z79.01 Long term (current) use of anticoagulants I48.0 Paroxysmal atrial fibrillation Follow-up Appointments Return Appointment in 1 week. Home Health [facility] for wound care. May utilize formulary equivalent dressing for wound treatment orders unless otherwise specified. Home Health Nurse may visit PRN to address patients wound care needs. [name] for wound care to left calcan [phone]  this point I think that she is making headway towards getting this closed although I do believe there is quite a bit of slough and biofilm skin need to be cleaned off just a little bit of callus around the edges of the wound. 11-10-2022 upon evaluation today patient appears to be doing well currently in regard to her heel ulcer. She has been tolerating the dressing changes without complication and in general I feel like she is making excellent headway towards complete closure. 8/27; patient has a wound on the left heel near the tip. Using Promogran and Zituvimet. Comes in with open heeled  shoes however it has a lip that encompasses the wound area which is on the distal part of the Achilles area. 12-08-2022 upon evaluation today patient appears to be doing well currently in regard to her wound on the foot which is actually doing excellent. This heel is measuring about 60% smaller than last time I saw her. Fortunately I think that she is really doing quite well at this time. 01-11-2023 upon evaluation today patient appears to be doing well currently in regard to her wound. She has been tolerating the dressing changes without complication. With that being said her heel actually seems to be doing well. She is inquiring as to whether or not she can do her 2-week follow-up. Electronic Signature(s) Signed: 01/11/2023 3:56:24 PM By: Allen Derry PA-C Entered By: Allen Derry on 01/11/2023 15:56:24 -------------------------------------------------------------------------------- Physical Exam Details Patient Name: Date of Service: [name] 01/11/2023 2:45 PM Medical Record Number: [id number] Patient Account Number: [id number] Date of Birth/Sex: Treating RN: [birth year] (72 y.o. [name] Primary Care Provider: [name] Other Clinician: Betha Loa Referring Provider: Treating Provider/Extender: Algis Greenhouse Weeks in Treatment: 14 Constitutional Well-nourished and well-hydrated in no acute distress. Respiratory normal breathing without difficulty. [name] ([id number]) [id number] Page 4 of 9 Psychiatric this patient is able to make decisions and demonstrates good insight into disease process. Alert and Oriented x 3. pleasant and cooperative. Notes Upon inspection patient's wound bed actually showed signs of good granulation and epithelization at this point. Fortunately I do not see any signs of worsening overall. I am pleased with where we stand and I do believe that the patient is making headway towards closure  which is great news. Electronic Signature(s) Signed: 01/11/2023 3:56:35 PM By: Allen Derry PA-C Entered By: Allen Derry on 01/11/2023 15:56:35 -------------------------------------------------------------------------------- Physician Orders Details Patient Name: Date of Service: [name], [place] 01/11/2023 2:45 PM Medical Record Number: [id number] Patient Account Number: [id number] Date of Birth/Sex: Treating RN: [birth year] (72 y.o. [name] Primary Care Provider: [name] Other Clinician: Betha Loa Referring Provider: Treating Provider/Extender: Algis Greenhouse Weeks in Treatment: 14 The following information was scribed by: Betha Loa The information was scribed for: Allen Derry Verbal / Phone Orders: Yes Clinician: Angelina Pih Read Back and Verified: Yes Diagnosis Coding ICD-10 Coding Code Description E11.621 Type 2 diabetes mellitus with foot ulcer I87.323 Chronic venous hypertension (idiopathic) with inflammation of bilateral lower extremity L89.623 Pressure ulcer of left heel, stage 3 L89.616 Pressure-induced deep tissue damage of right heel Z79.01 Long term (current) use of anticoagulants I48.0 Paroxysmal atrial fibrillation Follow-up Appointments Return Appointment in 1 week. Home [facility] for wound care. May utilize formulary equivalent dressing for wound treatment orders unless otherwise specified. Home Health Nurse may visit PRN to address patients wound care needs. [name] for wound care to left calcan [phone]  this point I think that she is making headway towards getting this closed although I do believe there is quite a bit of slough and biofilm skin need to be cleaned off just a little bit of callus around the edges of the wound. 11-10-2022 upon evaluation today patient appears to be doing well currently in regard to her heel ulcer. She has been tolerating the dressing changes without complication and in general I feel like she is making excellent headway towards complete closure. 8/27; patient has a wound on the left heel near the tip. Using Promogran and Zituvimet. Comes in with open heeled  shoes however it has a lip that encompasses the wound area which is on the distal part of the Achilles area. 12-08-2022 upon evaluation today patient appears to be doing well currently in regard to her wound on the foot which is actually doing excellent. This heel is measuring about 60% smaller than last time I saw her. Fortunately I think that she is really doing quite well at this time. 01-11-2023 upon evaluation today patient appears to be doing well currently in regard to her wound. She has been tolerating the dressing changes without complication. With that being said her heel actually seems to be doing well. She is inquiring as to whether or not she can do her 2-week follow-up. Electronic Signature(s) Signed: 01/11/2023 3:56:24 PM By: Allen Derry PA-C Entered By: Allen Derry on 01/11/2023 15:56:24 -------------------------------------------------------------------------------- Physical Exam Details Patient Name: Date of Service: [name] 01/11/2023 2:45 PM Medical Record Number: [id number] Patient Account Number: [id number] Date of Birth/Sex: Treating RN: [birth year] (72 y.o. [name] Primary Care Provider: [name] Other Clinician: Betha Loa Referring Provider: Treating Provider/Extender: Algis Greenhouse Weeks in Treatment: 14 Constitutional Well-nourished and well-hydrated in no acute distress. Respiratory normal breathing without difficulty. [name] ([id number]) [id number] Page 4 of 9 Psychiatric this patient is able to make decisions and demonstrates good insight into disease process. Alert and Oriented x 3. pleasant and cooperative. Notes Upon inspection patient's wound bed actually showed signs of good granulation and epithelization at this point. Fortunately I do not see any signs of worsening overall. I am pleased with where we stand and I do believe that the patient is making headway towards closure  which is great news. Electronic Signature(s) Signed: 01/11/2023 3:56:35 PM By: Allen Derry PA-C Entered By: Allen Derry on 01/11/2023 15:56:35 -------------------------------------------------------------------------------- Physician Orders Details Patient Name: Date of Service: [name], [place] 01/11/2023 2:45 PM Medical Record Number: [id number] Patient Account Number: [id number] Date of Birth/Sex: Treating RN: [birth year] (72 y.o. [name] Primary Care Provider: [name] Other Clinician: Betha Loa Referring Provider: Treating Provider/Extender: Algis Greenhouse Weeks in Treatment: 14 The following information was scribed by: Betha Loa The information was scribed for: Allen Derry Verbal / Phone Orders: Yes Clinician: Angelina Pih Read Back and Verified: Yes Diagnosis Coding ICD-10 Coding Code Description E11.621 Type 2 diabetes mellitus with foot ulcer I87.323 Chronic venous hypertension (idiopathic) with inflammation of bilateral lower extremity L89.623 Pressure ulcer of left heel, stage 3 L89.616 Pressure-induced deep tissue damage of right heel Z79.01 Long term (current) use of anticoagulants I48.0 Paroxysmal atrial fibrillation Follow-up Appointments Return Appointment in 1 week. Home Health [facility] for wound care. May utilize formulary equivalent dressing for wound treatment orders unless otherwise specified. Home Health Nurse may visit PRN to address patients wound care needs. [name] for wound care to left calcan [phone]  anything like that. We will see patient back for reevaluation in 1 week here in the clinic. If anything worsens or changes patient will contact our office for additional recommendations. Electronic Signature(s) Signed: 01/11/2023 3:57:18 PM By: Allen Derry PA-C Entered By: Allen Derry on 01/11/2023 15:57:18 -------------------------------------------------------------------------------- SuperBill Details Patient Name: Date of Service: [name], [place] 01/11/2023 Medical Record Number: [id number] Patient Account Number: [id number] Date of Birth/Sex: Treating RN: 03/24/1950 (72 y.o. [name] Primary Care Provider: [name] Other Clinician: Betha Loa Referring Provider: Treating Provider/Extender: Algis Greenhouse Weeks in Treatment: 14 Diagnosis Coding ICD-10 Codes Code Description E11.621 Type 2 diabetes mellitus with foot ulcer I87.323 Chronic venous hypertension (idiopathic) with inflammation of bilateral lower extremity L89.623 Pressure ulcer of left  heel, stage 3 L89.616 Pressure-induced deep tissue damage of right heel Z79.01 Long term (current) use of anticoagulants I48.0 Paroxysmal atrial fibrillation Facility Procedures : CPT4 Code: [id number] Description: 99213 - WOUND CARE VISIT-LEV 3 EST PT Modifier: Quantity: 1 Physician Procedures : CPT4 Code Description Modifier [id number] 99213 - WC PHYS LEVEL 3 - EST PT ICD-10 Diagnosis Description E11.621 Type 2 diabetes mellitus with foot ulcer I87.323 Chronic venous hypertension (idiopathic) with inflammation of bilateral lower extremity  L89.623 Pressure ulcer of left heel, stage 3 L89.616 Pressure-induced deep tissue damage of right heel Quantity: 1 Electronic Signature(s) Signed: 01/11/2023 3:57:30 PM By: Allen Derry PA-C Entered By: Allen Derry on 01/11/2023 15:57:30  DARNETTE, KUSZ ([id number]) [id number] Page 1 of 9 Visit Report for 01/11/2023 Chief Complaint Document Details Patient Name: Date of Service: [name] 01/11/2023 2:45 PM Medical Record Number: [id number] Patient Account Number: [id number] Date of Birth/Sex: Treating RN: 1950/09/16 (72 y.o. [name] Primary Care Provider: [name] Other Clinician: Betha Loa Referring Provider: Treating Provider/Extender: Algis Greenhouse Weeks in Treatment: 14 Information Obtained from: Patient Chief Complaint Bilateral heel ulcers Electronic Signature(s) Signed: 01/11/2023 3:10:48 PM By: Allen Derry PA-C Entered By: Allen Derry on 01/11/2023 15:10:48 -------------------------------------------------------------------------------- HPI Details Patient Name: Date of Service: [name], [place] 01/11/2023 2:45 PM Medical Record Number: [id number] Patient Account Number: [id number] Date of Birth/Sex: Treating RN: 07-19-1950 (72 y.o. [name] Primary Care Provider: [name] Other Clinician: Betha Loa Referring Provider: Treating Provider/Extender: Algis Greenhouse Weeks in Treatment: 14 History of Present Illness HPI Description: 06/28/20 upon evaluation today patient appears to be doing unfortunately somewhat poorly in regard to wounds that she has over her lower extremities. She has a wound on the left distal second toe, left posterior heel, and right anterior lower leg. With that being said all in all none of the wounds appear to be too bad the one that hurts her the most is actually the wound on the posterior heel which actually is also one of the smallest. Nonetheless I think there is some callus hiding what is really going on in this area. Fortunately there does not appear to be any obvious evidence of infection which is great news. Patient does have a history of diabetes mellitus type 2, chronic venous  insufficiency, hypertension, history of DVT and is on long-term anticoagulant s, therapy, Eliquis. 07/05/20 upon evaluation today patient appears to be doing well currently with regard to her wounds. I feel like she is making progress which is great news and overall there is no signs of active infection at this time. No fevers, chills, nausea, vomiting, or diarrhea. 4/29; patient has a only a small area remaining on the tip of her left second toe and a small area remaining on the left heel. The area on the right anterior lower leg is healed. They tell me that she had remote podiatric surgery on the toes of the left foot however they are very fixed in position and I wonder whether they are making it difficult to relieve pressure in her foot wear. She is a type II diabetic with neuropathy as well. 08/01/2020 upon evaluation today patient appears to be doing well currently in regard to her wounds. In fact everything appears to be doing much better. The posterior aspect of her heel is actually giving her some trouble not the Achilles area but a small wound that we did not even have marked. In fact I had noted this before. Nonetheless we are going to addressed that today. 5/27; most of the patient's wounds are healed including the left Achilles heel left toe. Right forearm is also healed. She has a new abrasion posteriorly in the TUWANNA, POLANCO B ([id number]) [id number] Page 2 of 9 right elbow area just above the olecranon this happened today with an abrasion from her car door 6/15; the patient's wounds on the left Achilles and left second toe remain healed. The area on the right forearm is also just about healed in fact the skin I replaced last time is looking viable which is good. She had me look at the left heel again which is not in  anything like that. We will see patient back for reevaluation in 1 week here in the clinic. If anything worsens or changes patient will contact our office for additional recommendations. Electronic Signature(s) Signed: 01/11/2023 3:57:18 PM By: Allen Derry PA-C Entered By: Allen Derry on 01/11/2023 15:57:18 -------------------------------------------------------------------------------- SuperBill Details Patient Name: Date of Service: [name], [place] 01/11/2023 Medical Record Number: [id number] Patient Account Number: [id number] Date of Birth/Sex: Treating RN: 03/24/1950 (72 y.o. [name] Primary Care Provider: [name] Other Clinician: Betha Loa Referring Provider: Treating Provider/Extender: Algis Greenhouse Weeks in Treatment: 14 Diagnosis Coding ICD-10 Codes Code Description E11.621 Type 2 diabetes mellitus with foot ulcer I87.323 Chronic venous hypertension (idiopathic) with inflammation of bilateral lower extremity L89.623 Pressure ulcer of left  heel, stage 3 L89.616 Pressure-induced deep tissue damage of right heel Z79.01 Long term (current) use of anticoagulants I48.0 Paroxysmal atrial fibrillation Facility Procedures : CPT4 Code: [id number] Description: 99213 - WOUND CARE VISIT-LEV 3 EST PT Modifier: Quantity: 1 Physician Procedures : CPT4 Code Description Modifier [id number] 99213 - WC PHYS LEVEL 3 - EST PT ICD-10 Diagnosis Description E11.621 Type 2 diabetes mellitus with foot ulcer I87.323 Chronic venous hypertension (idiopathic) with inflammation of bilateral lower extremity  L89.623 Pressure ulcer of left heel, stage 3 L89.616 Pressure-induced deep tissue damage of right heel Quantity: 1 Electronic Signature(s) Signed: 01/11/2023 3:57:30 PM By: Allen Derry PA-C Entered By: Allen Derry on 01/11/2023 15:57:30  Anesthetic (Use 'Patient Medications' Section for Anesthetic Order Entry) Lidocaine applied to wound bed Edema Control - Lymphedema / Segmental Compressive Device / Other Elevate, Exercise Daily and A void Standing for Long Periods of Time. Elevate legs to the level of the heart and pump ankles as often as possible Elevate leg(s) parallel to the floor when sitting. Wound Treatment Wound #24  - Calcaneus Wound Laterality: Left Cleanser: Normal Saline Every Other Day/30 Days Discharge Instructions: Wash your hands with soap and water. Remove old dressing, discard into plastic bag and place into trash. Cleanse the wound with Normal Saline prior to applying a clean dressing using gauze sponges, not tissues or cotton balls. Do not scrub or use excessive force. Pat dry using gauze sponges, not tissue or cotton balls. Cleanser: Soap and Water Every Other Day/30 Days Discharge Instructions: Gently cleanse wound with antibacterial soap, rinse and pat dry prior to dressing wounds Prim Dressing: Promogran Matrix 4.34 (in) (DME) (Dispense As Written) Every Other Day/30 Days ary Discharge Instructions: Moisten w/normal saline or sterile water; Cover wound as directed. Do not remove from wound bed. [name] ([id number]) [id number] Page 5 of 9 Secondary Dressing: Coverlet Latex-Free Fabric Adhesive Dressings Every Other Day/30 Days Discharge Instructions: 1.5 x 2 Electronic Signature(s) Signed: 01/14/2023 6:30:03 PM By: Allen Derry PA-C Signed: 01/15/2023 10:28:30 AM By: Betha Loa Previous Signature: 01/11/2023 4:50:43 PM Version By: Allen Derry PA-C Previous Signature: 01/12/2023 4:53:28 PM Version By: Betha Loa Entered By: Betha Loa on 01/14/2023 17:09:50 -------------------------------------------------------------------------------- Problem List Details Patient Name: Date of Service: [name] 01/11/2023 2:45 PM Medical Record Number: [id number] Patient Account Number: [id number] Date of Birth/Sex: Treating RN: [birth year] (72 y.o. [name] Primary Care Provider: [name] Other Clinician: Betha Loa Referring Provider: Treating Provider/Extender: Algis Greenhouse Weeks in Treatment: 14 Active Problems ICD-10 Encounter Code Description Active Date MDM Diagnosis E11.621 Type 2 diabetes mellitus  with foot ulcer 10/01/2022 No Yes I87.323 Chronic venous hypertension (idiopathic) with inflammation of bilateral lower 10/01/2022 No Yes extremity L89.623 Pressure ulcer of left heel, stage 3 10/01/2022 No Yes L89.616 Pressure-induced deep tissue damage of right heel 10/01/2022 No Yes Z79.01 Long term (current) use of anticoagulants 10/01/2022 No Yes I48.0 Paroxysmal atrial fibrillation 10/01/2022 No Yes Inactive Problems Resolved Problems Electronic Signature(s) Signed: 01/11/2023 3:10:42 PM By: Allen Derry PA-C Entered By: Allen Derry on 01/11/2023 15:10:42 Trudee Grip B ([id number]) [id number] Page 6 of 9 -------------------------------------------------------------------------------- Progress Note Details Patient Name: Date of Service: [name] 01/11/2023 2:45 PM Medical Record Number: [id number] Patient Account Number: [id number] Date of Birth/Sex: Treating RN: 1951/01/20 (72 y.o. [name] Primary Care Provider: [name] Other Clinician: Betha Loa Referring Provider: Treating Provider/Extender: Algis Greenhouse Weeks in Treatment: 14 Subjective Chief Complaint Information obtained from Patient Bilateral heel ulcers History of Present Illness (HPI) 06/28/20 upon evaluation today patient appears to be doing unfortunately somewhat poorly in regard to wounds that she has over her lower extremities. She has a wound on the left distal second toe, left posterior heel, and right anterior lower leg. With that being said all in all none of the wounds appear to be too bad the one that hurts her the most is actually the wound on the posterior heel which actually is also one of the smallest. Nonetheless I think there is some callus hiding what is really going on in this area. Fortunately there does not appear to be any obvious evidence of infection

## 2023-01-11 NOTE — Progress Notes (Signed)
Primary Care Anhar Mcdermott: [name] Other Clinician: Betha Loa Referring Kemoni Quesenberry: Treating Yesenia Fontenette/Extender: Algis Greenhouse Weeks in Treatment: [address] DRITA, [place] B ([id number]) [id number] Page 5 of 9 Height(in): 64 Pulse(bpm): 89 Weight(lbs): 250 Blood Pressure(mmHg): 104/69 Body Mass Index(BMI): 42.9 Temperature(F): 97.8 Respiratory Rate(breaths/min): 18 [24:Photos:] [N/A:N/A] Left Calcaneus N/A N/A Wound Location: Gradually Appeared N/A N/A Wounding Event: Pressure Ulcer N/A N/A Primary Etiology: Asthma, Pneumothorax, Arrhythmia, N/A N/A Comorbid History: Hypertension, Type II Diabetes, Gout, Osteoarthritis, Neuropathy 08/22/2022 N/A N/A Date Acquired: 14 N/A N/A Weeks of Treatment: Open N/A N/A Wound Status: No N/A N/A Wound Recurrence: 0.4x0.3x0.1 N/A N/A Measurements L x W x D (cm) 0.094 N/A N/A A (cm) : rea 0.009 N/A N/A Volume (cm) : 92.00% N/A N/A % Reduction in A rea: 92.40% N/A N/A % Reduction in Volume: Category/Stage III N/A N/A Classification: Small N/A N/A Exudate A mount: Serous N/A N/A Exudate Type: amber N/A N/A Exudate Color: Distinct, outline attached N/A N/A Wound Margin: Small (1-33%) N/A N/A Granulation A mount: Red N/A N/A Granulation Quality: Medium (34-66%) N/A  N/A Necrotic A mount: Fat Layer (Subcutaneous Tissue): Yes N/A N/A Exposed Structures: Fascia: No Tendon: No Muscle: No Joint: No Bone: No None N/A N/A Epithelialization: Treatment Notes Electronic Signature(s) Signed: 01/12/2023 4:53:28 PM By: Betha Loa Entered By: Betha Loa on 01/11/2023 15:29:03 -------------------------------------------------------------------------------- Multi-Disciplinary Care Plan Details Patient Name: Date of Service: [name], [name] 01/11/2023 2:45 PM Medical Record Number: [id number] Patient Account Number: [id number] Date of Birth/Sex: Treating RN: 01/27/1951 (72 y.o. Esmeralda Links Primary Care Rhyan Radler: [name] Other Clinician: Betha Loa Referring Kenly Xiao: Treating Jacqeline Broers/Extender: Amedeo Kinsman in Treatment: [address], [place] B ([id number]) [id number] Page 6 of 9 Active Inactive Necrotic Tissue Nursing Diagnoses: Knowledge deficit related to management of necrotic/devitalized tissue Goals: Necrotic/devitalized tissue will be minimized in the wound bed Date Initiated: 10/01/2022 Target Resolution Date: 11/01/2022 Goal Status: Active Interventions: Assess patient pain level pre-, during and post procedure and prior to discharge Treatment Activities: Apply topical anesthetic as ordered : 10/01/2022 Notes: Wound/Skin Impairment Nursing Diagnoses: Knowledge deficit related to ulceration/compromised skin integrity Goals: Patient/caregiver will verbalize understanding of skin care regimen Date Initiated: 10/01/2022 Target Resolution Date: 11/01/2022 Goal Status: Active Ulcer/skin breakdown will have a volume reduction of 30% by week 4 Date Initiated: 10/01/2022 Target Resolution Date: 11/01/2022 Goal Status: Active Ulcer/skin breakdown will have a volume reduction of 50% by week 8 Date Initiated: 10/01/2022 Target Resolution Date: 12/02/2022 Goal Status:  Active Ulcer/skin breakdown will have a volume reduction of 80% by week 12 Date Initiated: 10/01/2022 Target Resolution Date: 01/01/2023 Goal Status: Active Ulcer/skin breakdown will heal within 14 weeks Date Initiated: 10/01/2022 Target Resolution Date: 02/01/2023 Goal Status: Active Interventions: Assess patient/caregiver ability to obtain necessary supplies Assess patient/caregiver ability to perform ulcer/skin care regimen upon admission and as needed Assess ulceration(s) every visit Notes: Electronic Signature(s) Signed: 01/11/2023 5:09:45 PM By: Angelina Pih Signed: 01/12/2023 4:53:28 PM By: Betha Loa Entered By: Betha Loa on 01/11/2023 15:46:25 -------------------------------------------------------------------------------- Pain Assessment Details Patient Name: Date of Service: [name] 01/11/2023 2:45 PM Medical Record Number: [id number] Patient Account Number: [id number] Date of Birth/Sex: Treating RN: 1950-09-03 (72 y.o. Esmeralda Links Primary Care Idaliz Tinkle: [name] Other Clinician: Betha Loa Referring Preslea Rhodus: Treating Ashante Yellin/Extender: Algis Greenhouse [place], Harriett Sine B ([id number]) [id number] Page 7 of 9 Weeks in Treatment: 14 Active Problems Location of Pain Severity and Description of Pain Patient Has Paino No  ARIEYANA, BREDEHOEFT B ([id number]) [id number] Page 1 of 9 Visit Report for 01/11/2023 Arrival Information Details Patient Name: Date of Service: [name] 01/11/2023 2:45 PM Medical Record Number: [id number] Patient Account Number: [id number] Date of Birth/Sex: Treating RN: 05/30/1950 (72 y.o. Esmeralda Links Primary Care Jayson Waterhouse: [name] Other Clinician: Betha Loa Referring Yilin Weedon: Treating Asael Pann/Extender: Amedeo Kinsman in Treatment: 14 Visit Information History Since Last Visit All ordered tests and consults were completed: No Patient Arrived: Wheel Chair Added or deleted any medications: No Arrival Time: 15:12 Any new allergies or adverse reactions: No Transfer Assistance: None Had a fall or experienced change in No Patient Identification Verified: Yes activities of daily living that may affect Secondary Verification Process Completed: Yes risk of falls: Patient Requires Transmission-Based Precautions: No Signs or symptoms of abuse/neglect since last visito No Patient Has Alerts: Yes Hospitalized since last visit: No Patient Alerts: Patient on Blood Thinner Implantable device outside of the clinic excluding No ABI L 1.1 02/05/22 cellular tissue based products placed in the center ABI R 1.12 02/05/22 since last visit: Has Dressing in Place as Prescribed: Yes Has Compression in Place as Prescribed: Yes Pain Present Now: No Electronic Signature(s) Signed: 01/12/2023 4:53:28 PM By: Betha Loa Entered By: Betha Loa on 01/11/2023 15:13:41 -------------------------------------------------------------------------------- Clinic Level of Care Assessment Details Patient Name: Date of Service: [name] 01/11/2023 2:45 PM Medical Record Number: [id number] Patient Account Number: [id number] Date of Birth/Sex: Treating RN: 02/11/1951 (72 y.o. Esmeralda Links Primary Care Amery Vandenbos: [name] Other Clinician: Betha Loa Referring Khalie Wince: Treating Lael Pilch/Extender: Amedeo Kinsman in Treatment: 14 Clinic Level of Care Assessment Items TOOL 4 Quantity Score []  - 0 Use when only an EandM is performed on FOLLOW-UP visit ASSESSMENTS - Nursing Assessment / Reassessment X- 1 10 Reassessment of Co-morbidities (includes updates in patient status) TYNISHIA, AVARA B ([id number]) [phone].pdf Page 2 of 9 X- 1 5 Reassessment of Adherence to Treatment Plan ASSESSMENTS - Wound and Skin A ssessment / Reassessment X - Simple Wound Assessment / Reassessment - one wound 1 5 []  - 0 Complex Wound Assessment / Reassessment - multiple wounds []  - 0 Dermatologic / Skin Assessment (not related to wound area) ASSESSMENTS - Focused Assessment []  - 0 Circumferential Edema Measurements - multi extremities []  - 0 Nutritional Assessment / Counseling / Intervention []  - 0 Lower Extremity Assessment (monofilament, tuning fork, pulses) []  - 0 Peripheral Arterial Disease Assessment (using hand held doppler) ASSESSMENTS - Ostomy and/or Continence Assessment and Care []  - 0 Incontinence Assessment and Management []  - 0 Ostomy Care Assessment and Management (repouching, etc.) PROCESS - Coordination of Care X - Simple Patient / Family Education for ongoing care 1 15 []  - 0 Complex (extensive) Patient / Family Education for ongoing care []  - 0 Staff obtains Chiropractor, Records, T Results / Process Orders est []  - 0 Staff telephones HHA, Nursing Homes / Clarify orders / etc []  - 0 Routine Transfer to another Facility (non-emergent condition) []  - 0 Routine Hospital Admission (non-emergent condition) []  - 0 New Admissions / Manufacturing engineer / Ordering NPWT Apligraf, etc. , []  - 0 Emergency Hospital Admission (emergent condition) X- 1 10 Simple Discharge Coordination []  - 0 Complex (extensive) Discharge Coordination PROCESS - Special  Needs []  - 0 Pediatric / Minor Patient Management []  - 0 Isolation Patient Management []  - 0 Hearing / Language / Visual special needs []  - 0 Assessment of Community assistance (transportation, D/C  planning, etc.) []  - 0 Additional assistance / Altered mentation []  - 0 Support Surface(s) Assessment (bed, cushion, seat, etc.) INTERVENTIONS - Wound Cleansing / Measurement X - Simple Wound Cleansing - one wound 1 5 []  - 0 Complex Wound Cleansing - multiple wounds X- 1 5 Wound Imaging (photographs - any number of wounds) []  - 0 Wound Tracing (instead of photographs) X- 1 5 Simple Wound Measurement - one wound []  - 0 Complex Wound Measurement - multiple wounds INTERVENTIONS - Wound Dressings []  - 0 Small Wound Dressing one or multiple wounds X- 1 15 Medium Wound Dressing one or multiple wounds []  - 0 Large Wound Dressing one or multiple wounds []  - 0 Application of Medications - topical []  - 0 Application of Medications - injection INTERVENTIONS - Miscellaneous ANNELY, AURINGER B ([id number]) [id number] Page 3 of 9 []  - 0 External ear exam []  - 0 Specimen Collection (cultures, biopsies, blood, body fluids, etc.) []  - 0 Specimen(s) / Culture(s) sent or taken to Lab for analysis []  - 0 Patient Transfer (multiple staff / Michiel Sites Lift / Similar devices) []  - 0 Simple Staple / Suture removal (25 or less) []  - 0 Complex Staple / Suture removal (26 or more) []  - 0 Hypo / Hyperglycemic Management (close monitor of Blood Glucose) []  - 0 Ankle / Brachial Index (ABI) - do not check if billed separately X- 1 5 Vital Signs Has the patient been seen at the hospital within the last three years: Yes Total Score: 80 Level Of Care: New/Established - Level 3 Electronic Signature(s) Signed: 01/12/2023 4:53:28 PM By: Betha Loa Entered By: Betha Loa on 01/11/2023  15:46:06 -------------------------------------------------------------------------------- Encounter Discharge Information Details Patient Name: Date of Service: [name], [name] 01/11/2023 2:45 PM Medical Record Number: [id number] Patient Account Number: [id number] Date of Birth/Sex: Treating RN: 06/10/1950 (72 y.o. Esmeralda Links Primary Care Lempi Edwin: [name] Other Clinician: Betha Loa Referring Rickiya Picariello: Treating Margaux Engen/Extender: Amedeo Kinsman in Treatment: 14 Encounter Discharge Information Items Discharge Condition: Stable Ambulatory Status: Wheelchair Discharge Destination: Home Transportation: Private Auto Accompanied By: husband Schedule Follow-up Appointment: Yes Clinical Summary of Care: Electronic Signature(s) Signed: 01/12/2023 4:53:28 PM By: Betha Loa Entered By: Betha Loa on 01/11/2023 15:57:25 Trudee Grip B ([id number]) [id number] Page 4 of 9 -------------------------------------------------------------------------------- Lower Extremity Assessment Details Patient Name: Date of Service: [name] 01/11/2023 2:45 PM Medical Record Number: [id number] Patient Account Number: [id number] Date of Birth/Sex: Treating RN: 1950-06-13 (72 y.o. Esmeralda Links Primary Care Euphemia Lingerfelt: [name] Other Clinician: Betha Loa Referring Jaydee Conran: Treating Bravery Ketcham/Extender: Algis Greenhouse Weeks in Treatment: 14 Edema Assessment Assessed: [Left: Yes] [Right: No] Edema: [Left: Ye] [Right: s] Calf Left: Right: Point of Measurement: 33 cm From Medial Instep 31.5 cm Ankle Left: Right: Point of Measurement: 7 cm From Medial Instep 17 cm Knee To Floor Left: Right: From Medial Instep 46 cm Vascular Assessment Pulses: Dorsalis Pedis Palpable: [Left:Yes] Extremity colors, hair growth, and conditions: Hair Growth on Extremity: [Left:Yes] Temperature of Extremity: [Left:Cool  < 3 seconds] Toe Nail Assessment Left: Right: Thick: Yes Discolored: No Deformed: No Improper Length and Hygiene: No Electronic Signature(s) Signed: 01/11/2023 5:09:45 PM By: Angelina Pih Signed: 01/12/2023 4:53:28 PM By: Betha Loa Entered By: Betha Loa on 01/11/2023 15:28:57 -------------------------------------------------------------------------------- Multi Wound Chart Details Patient Name: Date of Service: [name], [name] 01/11/2023 2:45 PM Medical Record Number: [id number] Patient Account Number: [id number] Date of Birth/Sex: Treating RN: 10/22/1950 (72 y.o. Philbert Riser, Luther Parody  Site Locations Pain Management and Medication Current Pain Management: Electronic Signature(s) Signed: 01/11/2023 5:09:45 PM By: Angelina Pih Signed: 01/12/2023 4:53:28 PM By: Betha Loa Entered By: Betha Loa on 01/11/2023 15:16:26 -------------------------------------------------------------------------------- Patient/Caregiver Education Details Patient Name: Date of Service: [name] 10/21/2024andnbsp2:45 PM Medical Record Number: [id number] Patient Account Number: [id number] Date of Birth/Gender: Treating RN: 1951-03-09 (72 y.o. Esmeralda Links Primary  Care Physician: [name] Other Clinician: Betha Loa Referring Physician: Treating Physician/Extender: Amedeo Kinsman in Treatment: 14 Education Assessment Education Provided To: Patient Education Topics Provided Wound/Skin Impairment: Handouts: Other: continue wound care as directed Methods: Explain/Verbal Responses: State content correctly Electronic Signature(s) Signed: 01/12/2023 4:53:28 PM By: Betha Loa Entered By: Betha Loa on 01/11/2023 15:46:50 Trudee Grip B ([id number]) [id number] Page 8 of 9 -------------------------------------------------------------------------------- Wound Assessment Details Patient Name: Date of Service: [name] 01/11/2023 2:45 PM Medical Record Number: [id number] Patient Account Number: [id number] Date of Birth/Sex: Treating RN: 28-Apr-1950 (72 y.o. Esmeralda Links Primary Care Meridee Branum: [name] Other Clinician: Betha Loa Referring Tyshon Fanning: Treating Mylee Falin/Extender: Algis Greenhouse Weeks in Treatment: 14 Wound Status Wound Number: 24 Primary Pressure Ulcer Etiology: Wound Location: Left Calcaneus Wound Open Wounding Event: Gradually Appeared Status: Date Acquired: 08/22/2022 Comorbid Asthma, Pneumothorax, Arrhythmia, Hypertension, Type II Weeks Of Treatment: 14 History: Diabetes, Gout, Osteoarthritis, Neuropathy Clustered Wound: No Photos Wound Measurements Length: (cm) 0.4 Width: (cm) 0.3 Depth: (cm) 0.1 Area: (cm) 0.09 Volume: (cm) 0.00 % Reduction in Area: 92% % Reduction in Volume: 92.4% Epithelialization: None 4 9 Wound Description Classification: Category/Stage III Wound Margin: Distinct, outline attached Exudate Amount: Small Exudate Type: Serous Exudate Color: amber Foul Odor After Cleansing: No Slough/Fibrino Yes Wound Bed Granulation Amount: Small (1-33%) Exposed Structure Granulation Quality: Red Fascia Exposed:  No Necrotic Amount: Medium (34-66%) Fat Layer (Subcutaneous Tissue) Exposed: Yes Necrotic Quality: Adherent Slough, Bone Tendon Exposed: No Muscle Exposed: No Joint Exposed: No Bone Exposed: No Treatment Notes Wound #24 (Calcaneus) Wound Laterality: Left Cleanser Normal Saline ANNJANETTE, DUNG B ([id number]) [id number] Page 9 of 9 Discharge Instruction: Wash your hands with soap and water. Remove old dressing, discard into plastic bag and place into trash. Cleanse the wound with Normal Saline prior to applying a clean dressing using gauze sponges, not tissues or cotton balls. Do not scrub or use excessive force. Pat dry using gauze sponges, not tissue or cotton balls. Soap and Water Discharge Instruction: Gently cleanse wound with antibacterial soap, rinse and pat dry prior to dressing wounds Peri-Wound Care Topical Primary Dressing Promogran Matrix 4.34 (in) Discharge Instruction: Moisten w/normal saline or sterile water; Cover wound as directed. Do not remove from wound bed. Secondary Dressing Coverlet Latex-Free Fabric Adhesive Dressings Discharge Instruction: 1.5 x 2 Secured With Compression Wrap Compression Stockings Add-Ons Electronic Signature(s) Signed: 01/11/2023 5:09:45 PM By: Angelina Pih Signed: 01/12/2023 4:53:28 PM By: Betha Loa Entered By: Betha Loa on 01/11/2023 15:27:32 -------------------------------------------------------------------------------- Vitals Details Patient Name: Date of Service: RO Yolanda Manges, [name]. 01/11/2023 2:45 PM Medical Record Number: [id number] Patient Account Number: [id number] Date of Birth/Sex: Treating RN: September 16, 1950 (72 y.o. Esmeralda Links Primary Care Korde Jeppsen: [name] Other Clinician: Betha Loa Referring Glendoris Nodarse: Treating Clarion Mooneyhan/Extender: Algis Greenhouse Weeks in Treatment: 14 Vital Signs Time Taken: 15:14 Temperature (F): 97.8 Height (in): 64 Pulse (bpm):  89 Weight (lbs): 250 Respiratory Rate (breaths/min): 18 Body Mass Index (BMI): 42.9 Blood Pressure (mmHg): 104/69 Reference Range: 80 - 120 mg / dl Electronic Signature(s) Signed:  Site Locations Pain Management and Medication Current Pain Management: Electronic Signature(s) Signed: 01/11/2023 5:09:45 PM By: Angelina Pih Signed: 01/12/2023 4:53:28 PM By: Betha Loa Entered By: Betha Loa on 01/11/2023 15:16:26 -------------------------------------------------------------------------------- Patient/Caregiver Education Details Patient Name: Date of Service: [name] 10/21/2024andnbsp2:45 PM Medical Record Number: [id number] Patient Account Number: [id number] Date of Birth/Gender: Treating RN: 1951-03-09 (72 y.o. Esmeralda Links Primary  Care Physician: [name] Other Clinician: Betha Loa Referring Physician: Treating Physician/Extender: Amedeo Kinsman in Treatment: 14 Education Assessment Education Provided To: Patient Education Topics Provided Wound/Skin Impairment: Handouts: Other: continue wound care as directed Methods: Explain/Verbal Responses: State content correctly Electronic Signature(s) Signed: 01/12/2023 4:53:28 PM By: Betha Loa Entered By: Betha Loa on 01/11/2023 15:46:50 Trudee Grip B ([id number]) [id number] Page 8 of 9 -------------------------------------------------------------------------------- Wound Assessment Details Patient Name: Date of Service: [name] 01/11/2023 2:45 PM Medical Record Number: [id number] Patient Account Number: [id number] Date of Birth/Sex: Treating RN: 28-Apr-1950 (72 y.o. Esmeralda Links Primary Care Meridee Branum: [name] Other Clinician: Betha Loa Referring Tyshon Fanning: Treating Mylee Falin/Extender: Algis Greenhouse Weeks in Treatment: 14 Wound Status Wound Number: 24 Primary Pressure Ulcer Etiology: Wound Location: Left Calcaneus Wound Open Wounding Event: Gradually Appeared Status: Date Acquired: 08/22/2022 Comorbid Asthma, Pneumothorax, Arrhythmia, Hypertension, Type II Weeks Of Treatment: 14 History: Diabetes, Gout, Osteoarthritis, Neuropathy Clustered Wound: No Photos Wound Measurements Length: (cm) 0.4 Width: (cm) 0.3 Depth: (cm) 0.1 Area: (cm) 0.09 Volume: (cm) 0.00 % Reduction in Area: 92% % Reduction in Volume: 92.4% Epithelialization: None 4 9 Wound Description Classification: Category/Stage III Wound Margin: Distinct, outline attached Exudate Amount: Small Exudate Type: Serous Exudate Color: amber Foul Odor After Cleansing: No Slough/Fibrino Yes Wound Bed Granulation Amount: Small (1-33%) Exposed Structure Granulation Quality: Red Fascia Exposed:  No Necrotic Amount: Medium (34-66%) Fat Layer (Subcutaneous Tissue) Exposed: Yes Necrotic Quality: Adherent Slough, Bone Tendon Exposed: No Muscle Exposed: No Joint Exposed: No Bone Exposed: No Treatment Notes Wound #24 (Calcaneus) Wound Laterality: Left Cleanser Normal Saline ANNJANETTE, DUNG B ([id number]) [id number] Page 9 of 9 Discharge Instruction: Wash your hands with soap and water. Remove old dressing, discard into plastic bag and place into trash. Cleanse the wound with Normal Saline prior to applying a clean dressing using gauze sponges, not tissues or cotton balls. Do not scrub or use excessive force. Pat dry using gauze sponges, not tissue or cotton balls. Soap and Water Discharge Instruction: Gently cleanse wound with antibacterial soap, rinse and pat dry prior to dressing wounds Peri-Wound Care Topical Primary Dressing Promogran Matrix 4.34 (in) Discharge Instruction: Moisten w/normal saline or sterile water; Cover wound as directed. Do not remove from wound bed. Secondary Dressing Coverlet Latex-Free Fabric Adhesive Dressings Discharge Instruction: 1.5 x 2 Secured With Compression Wrap Compression Stockings Add-Ons Electronic Signature(s) Signed: 01/11/2023 5:09:45 PM By: Angelina Pih Signed: 01/12/2023 4:53:28 PM By: Betha Loa Entered By: Betha Loa on 01/11/2023 15:27:32 -------------------------------------------------------------------------------- Vitals Details Patient Name: Date of Service: RO Yolanda Manges, [name]. 01/11/2023 2:45 PM Medical Record Number: [id number] Patient Account Number: [id number] Date of Birth/Sex: Treating RN: September 16, 1950 (72 y.o. Esmeralda Links Primary Care Korde Jeppsen: [name] Other Clinician: Betha Loa Referring Glendoris Nodarse: Treating Clarion Mooneyhan/Extender: Algis Greenhouse Weeks in Treatment: 14 Vital Signs Time Taken: 15:14 Temperature (F): 97.8 Height (in): 64 Pulse (bpm):  89 Weight (lbs): 250 Respiratory Rate (breaths/min): 18 Body Mass Index (BMI): 42.9 Blood Pressure (mmHg): 104/69 Reference Range: 80 - 120 mg / dl Electronic Signature(s) Signed:

## 2023-01-12 ENCOUNTER — Ambulatory Visit: Payer: Medicare HMO | Admitting: Pulmonary Disease

## 2023-01-12 ENCOUNTER — Encounter: Payer: Self-pay | Admitting: Pulmonary Disease

## 2023-01-12 VITALS — BP 104/76 | HR 71 | Temp 97.6°F | Ht 64.0 in | Wt 248.6 lb

## 2023-01-12 DIAGNOSIS — G4733 Obstructive sleep apnea (adult) (pediatric): Secondary | ICD-10-CM

## 2023-01-12 DIAGNOSIS — Z23 Encounter for immunization: Secondary | ICD-10-CM

## 2023-01-12 DIAGNOSIS — Z6841 Body Mass Index (BMI) 40.0 and over, adult: Secondary | ICD-10-CM

## 2023-01-12 DIAGNOSIS — J4540 Moderate persistent asthma, uncomplicated: Secondary | ICD-10-CM

## 2023-01-12 DIAGNOSIS — J986 Disorders of diaphragm: Secondary | ICD-10-CM | POA: Diagnosis not present

## 2023-01-12 DIAGNOSIS — M25512 Pain in left shoulder: Secondary | ICD-10-CM | POA: Diagnosis not present

## 2023-01-12 DIAGNOSIS — Z96612 Presence of left artificial shoulder joint: Secondary | ICD-10-CM | POA: Diagnosis not present

## 2023-01-12 NOTE — Patient Instructions (Signed)
VISIT SUMMARY:  During your recent visit, we discussed your moderate persistent asthma, obstructive sleep apnea, and post-operative shoulder condition. You've shown significant improvement in your shoulder mobility after surgery, and your asthma and sleep apnea are well-managed with your current treatments. You also mentioned experiencing some elbow pain after moving your shoulder, which is likely due to underused muscles.  YOUR PLAN:  -MODERATE PERSISTENT ASTHMA: Your asthma is stable with no recent flare-ups. You've been doing well with your Trelegy 200 inhaler and haven't needed your rescue inhaler. We'll continue with this treatment plan, and you should keep your rescue inhaler handy just in case.  -OBSTRUCTIVE SLEEP APNEA: You've been using your CPAP machine consistently, which is excellent for managing your sleep apnea. We'll continue with this treatment.  -SHOULDER POST-OPERATIVE: Your shoulder mobility has improved significantly after surgery, but you've experienced some elbow pain. This is likely due to muscles that haven't been used much recently. Continue with your physical therapy and use your home exercise equipment for additional repetitions.  -GENERAL HEALTH MAINTENANCE: To keep you in good health, we administered the influenza vaccine during your visit. We'll see you again for a follow-up in 4-6 months.  INSTRUCTIONS:  Continue taking your Trelegy 200 for your asthma and using your CPAP machine for your sleep apnea. Keep up with your physical therapy for your shoulder and use your home exercise equipment for additional practice. Remember to keep your rescue inhaler handy. We'll see you again for a follow-up in 4-6 months.

## 2023-01-12 NOTE — Progress Notes (Deleted)
Subjective:   [name] ID: [name], DOB: Aug 05, 1950, 72 y.o.   MRN: [id number]  [name] Care Team: [name], NP as PCP - General (Nurse Practitioner) Iran Ouch, MD as PCP - Cardiology (Cardiology) Delano Metz, MD as Consulting Physician (Pain Medicine) Marykay Lex, MD as Consulting Physician (Cardiology) Poggi, Excell Seltzer, MD as Consulting Physician (Surgery) Creig Hines, MD as Consulting Physician (Oncology) Signa Kell, MD as Consulting Physician (Orthopedic Surgery)  Chief Complaint  [name] presents with   Follow-up    No shortness of breath, cough or wheezing. Wearing CPAP nightly.    BACKGROUND/INTERVAL: [name] is a 72 year old lifelong never smoker who carries a diagnosis of asthmatic bronchitis who presents for follow-up.  I last saw the [name] on 20 October 2022 that time she had completed inpatient rehab after left shoulder surgery.  HPI Discussed the use of AI scribe software for clinical note transcription with the [name], who gave verbal consent to proceed.  History of Present Illness The [name] is a 72 year old with a history of moderate persistent asthma/asthmatic bronchitis, obstructive sleep apnea, and morbid obesity. She presents for a follow-up visit. The [name] reports a significant improvement in shoulder mobility following surgery, with the ability to raise the arm and wash her hair for the first time in years. However, she experienced significant pain in the elbow region after this activity, which was attributed to underused muscles by her physical therapist.  The [name]'s breathing has been stable, with no need for a rescue inhaler. She continues to use Trelegy 200, with good compliance and oral hygiene practices post-inhalation. The [name] has been using a CPAP machine for over thirty years, with excellent compliance. Recent compliance download shows 100% compliance 30 out of 30 days.  Usage is over 4 hours each night.  Residual  AHI is 3.5.  CPAP is set at 11 cm H2O.  The [name] has a history of recurrent bronchitis, with two episodes progressing to pneumonia. However, she has not had an episode in the past three to four years. She recently had her ductwork cleaned, which may contribute to her improved respiratory status.  The [name] is satisfied with her current health status and has no additional concerns at this time.   DATA 12/18/2015 sleep study: Obstructive sleep apnea confirmed.  AHI 13.0 baseline and 37.5 during REM sleep. 09/22/2018 PFTs: FEV1 1.42 L or 67% predicted, FVC 1. 77 L or 63% predicted, FEV1/FVC 80%, no bronchodilator response.  Lung volumes mildly reduced, ERV greatly reduced at 12% consistent with obesity.  Diffusion capacity moderately reduced.  No overt obstruction noted, mild/moderate restriction likely due to obesity, known hemidiaphragm elevation. 09/03/2019 echocardiogram: LVEF 60 to 65%, no wall motion abnormality, no evidence of mitral valve regurgitation. 04/07/2020 echocardiogram with bubble study: LVEF 60 to 65%, no wall motion abnormalities.  Mild mitral valve regurgitation.  Mild to moderate tricuspid valve regurgitation.  Negative saline contrast study 06/03/2021 echocardiogram: LVEF 55 to 60%, no wall motion abnormalities, moderately elevated pulmonary artery systolic pressure.  Will size dilated.  Mild mitral regurgitation.  Moderate tricuspid valve regurgitation.  Right atrial pressure estimated at 8 mmHg. 08/11/2022 PFTs: FEV1 1.34 L or 63% predicted, FVC 1.50 L or 53% predicted, FEV1/FVC 89%, no bronchodilator response.  Lung volumes are moderately reduced.  Diffusion capacity moderately reduced and corrects partially for alveolar volume.  Overall no overt significant change from PFTs 2020.   Review of Systems A 10 point review of systems was performed and it is as  noted above otherwise negative.   [name] Active Problem List   Diagnosis Date Noted   Pressure ulcer of left heel,  stage 3 (HCC) 10/07/2022   S/P reverse total shoulder arthroplasty, right 09/02/2022   S/p reverse total shoulder arthroplasty 09/01/2022   Cutaneous candidiasis 08/13/2022   Yeast vaginitis 07/16/2022   Type 2 diabetes mellitus with hyperglycemia, without long-term current use of insulin (HCC) 06/16/2022   Bronchitis 03/26/2022   BMI 40.0-44.9, adult (HCC) 12/01/2021   Stage 3b chronic kidney disease (HCC) 12/01/2021   Dysuria 11/13/2021   Abnormal MRI, shoulder 10/29/2021   SOB (shortness of breath) 06/06/2021   Iron deficiency anemia 03/27/2021   Recurrent strokes (HCC) 02/26/2021   Chronic shoulder pain (Bilateral) 02/10/2021   Impaired range of motion of shoulder (Left) 02/10/2021   Hyperlipidemia LDL goal <70 01/28/2021   Allergic rhinitis 08/22/2020   Uncomplicated opioid dependence (HCC) 07/15/2020   Cognitive deficit, post-stroke 06/22/2020   Peripheral neurogenic pain 06/20/2020   Chronic peripheral neuropathic pain 06/20/2020   Chronic use of opiate for therapeutic purpose 06/20/2020   History of cerebrovascular accident (CVA) due to embolism 06/19/2020   History of stroke with current residual effects 06/05/2020   ANA positive 04/18/2020   Nonsustained ventricular tachycardia (HCC) 04/09/2020   B12 deficiency 03/11/2020   Ulcer of left heel and midfoot (HCC) 03/11/2020   PAD (peripheral artery disease) (HCC) 03/11/2020   Rotator cuff disorder, left 03/01/2020   Cerebrovascular accident (CVA) (HCC) 03/01/2020   Primary osteoarthritis of left shoulder 02/12/2020   Folate deficiency 02/08/2020   Vitamin D deficiency 02/08/2020   Malnutrition of moderate degree 02/06/2020   Left hemiparesis (HCC) 12/28/2019   Chronic bacteriuria 11/17/2019   Hyperkalemia 09/14/2019   Bacteremia due to Gram-positive bacteria 09/08/2019   Pressure injury of skin 09/04/2019   COPD with acute exacerbation (HCC) 09/02/2019   Pharmacologic therapy 08/30/2019   Recurrent falls 07/10/2019    Left ankle pain 06/02/2019   Chronic pain of right ankle 02/14/2019   Arthritis 02/14/2019   Meralgia paresthetica (Right) 12/05/2018   Chronic anticoagulation (Eliquis) 12/05/2018   Chronic ulcer of right leg (HCC) 10/18/2018   Fibromyalgia syndrome 08/17/2018   Chronic musculoskeletal pain 08/17/2018   Tremor of right hand 08/03/2018   Overactive bladder 08/03/2018   Physical deconditioning 08/03/2018   Recurrent UTI 07/29/2018   Foot ulcer, left (HCC) 03/09/2018   Adrenal mass (HCC) 08/02/2017   Eczema 06/27/2017   Mass of both adrenal glands (HCC) 05/16/2017   Fatty liver 05/13/2017   Hip strain (Right) 04/30/2017   Ischial pain, right 04/30/2017   Hip joint pain (Left) 04/22/2017   Abnormality of gait and mobility 04/22/2017   Muscle strain of hip (Left) 04/09/2017   Pain in joint involving ankle and foot 11/18/2016   Kidney stones 11/18/2016   Spinal stenosis of thoracic region 11/18/2016   Abscess of tendon of left foot 09/21/2016   Cubital tunnel syndrome (Right) 08/07/2016   Chronic pain syndrome 02/25/2016   Long term current use of opiate analgesic 02/25/2016   Lymphedema 12/26/2015   Status post reverse total shoulder replacement 10/08/2015   Neuropathy of lateral femoral cutaneous nerve (Right) 09/23/2015   Obesity, Class III, BMI 40-49.9 (morbid obesity) (HCC) 09/23/2015   Chronic prescription benzodiazepine use 09/23/2015   Arthralgia 09/23/2015   Osteoarthritis involving multiple joints 09/23/2015   Other shoulder lesions (Right) 08/28/2015   Cervical radiculitis 08/06/2015   Opiate use (15 MME/Day) 01/16/2015   Impingement syndrome of shoulder region  01/07/2015   Mixed incontinence 11/26/2014   Atrophic vaginitis 11/26/2014   Depression with anxiety 09/21/2014   Bladder cystocele 09/21/2014   Gastro-esophageal reflux disease without esophagitis 09/21/2014   At high risk for falls 09/21/2014   Diabetic peripheral neuropathy associated with type 2  diabetes mellitus (HCC) 08/31/2014   Asthma, well controlled 08/31/2014   Hypothyroidism 08/31/2014   Peripheral vascular disease due to secondary diabetes mellitus (HCC) 12/11/2013   OSA (obstructive sleep apnea) 10/19/2013   Essential hypertension 07/27/2013   Chronic atrial fibrillation 08/10/2012   Social History   Tobacco Use   Smoking status: Never   Smokeless tobacco: Never  Substance Use Topics   Alcohol use: No    Alcohol/week: 0.0 standard drinks of alcohol   Allergies  Allergen Reactions   Morphine And Codeine Other (See Comments) and Nausea Only    [name] becomes very confused Other reaction(s): Other (See Comments), Other (See Comments) [name] becomes very confused [name] becomes very confused    Penicillins Hives, Shortness Of Breath, Swelling and Other (See Comments)    Tolerates Keflex and Ceftriaxone   Ambien [Zolpidem]     Hallucination    Aspirin Hives   Oxytetracycline Hives    Other reaction(s): Unknown    Current Meds  Medication Sig   acetaminophen (TYLENOL) 500 MG tablet Take 2 tablets (1,000 mg total) by mouth every 8 (eight) hours.   albuterol (VENTOLIN HFA) 108 (90 Base) MCG/ACT inhaler Inhale 2 puffs into the lungs every 6 (six) hours as needed.   ALPRAZolam (XANAX) 0.25 MG tablet Take 1 tablet (0.25 mg total) by mouth 2 (two) times daily as needed for anxiety. See SIG change was taking 1/2 of 0.5 tablet bid prn changed to 0.25 bid prn. D/c 0.5 xanax   buPROPion (WELLBUTRIN XL) 150 MG 24 hr tablet Take 1 tablet (150 mg total) by mouth daily.   calcium-vitamin D (OSCAL WITH D) 500-200 MG-UNIT tablet Take 1 tablet by mouth 2 (two) times daily with a meal.   cephALEXin (KEFLEX) 250 MG capsule Take 1 capsule (250 mg total) by mouth daily.   clobetasol cream (TEMOVATE) 0.05 % Apply 1 application topically 2 (two) times daily as needed (skin irritation).    colchicine 0.6 MG tablet Take 0.6 mg by mouth daily. Repeat I 1 hour if needed. No more than  3 tablets in 24 hours PRN for GOUT FLARE   Continuous Glucose Sensor (FREESTYLE LIBRE 3 SENSOR) MISC    cyanocobalamin (VITAMIN B12) 1000 MCG tablet Take 1,000 mcg by mouth daily.   diclofenac Sodium (VOLTAREN) 1 % GEL Apply 2 g topically 3 (three) times daily.   DULoxetine (CYMBALTA) 60 MG capsule Take 1 capsule (60 mg total) by mouth daily. ([name] taking differently: Take 60 mg by mouth daily. 6 pm)   ELIQUIS 5 MG TABS tablet TAKE 1 TABLET BY MOUTH TWICE DAILY   empagliflozin (JARDIANCE) 25 MG TABS tablet Take 1 tablet (25 mg total) by mouth daily before breakfast.   ergocalciferol (VITAMIN D2) 1.25 MG (50000 UT) capsule Take 50,000 Units by mouth once a week.   estradiol (ESTRACE) 0.1 MG/GM vaginal cream Apply 0.5mg  (pea-sized amount)  just inside the vaginal introitus with a finger-tip on Monday, Wednesday and Friday nights,   ferrous sulfate 324 MG TBEC Take 324 mg by mouth daily with breakfast.   fluconazole (DIFLUCAN) 150 MG tablet Take 1 tablet by mouth x 1 dose. May repeat in 72 hours if needed.   Fluticasone-Umeclidin-Vilant (TRELEGY ELLIPTA) 200-62.5-25  MCG/ACT AEPB INHALE ONE PUFF INTO THE LUNGS EVERY DAY   folic acid (FOLVITE) 800 MCG tablet Take 800 mcg by mouth daily.   furosemide (LASIX) 40 MG tablet Take 1 tablet (40 mg total) by mouth as directed. Take 1 tablet daily. May take additional 1 tablet If weight gain of greater than 2 pounds in 24 hours or 5 pounds in one week.   glucose blood (ONETOUCH ULTRA) test strip TEST TWICE DAILY AS DIRECTED   ipratropium-albuterol (DUONEB) 0.5-2.5 (3) MG/3ML SOLN Take 3 mLs by nebulization every 6 (six) hours as needed.   Lancets (ONETOUCH ULTRASOFT) lancets Use as instructed   levocetirizine (XYZAL) 5 MG tablet TAKE ONE TABLET BY MOUTH AT BEDTIME AS NEEDED FOR ALLERGIES   levothyroxine (SYNTHROID) 137 MCG tablet TAKE ONE (1) TABLET BY MOUTH DAILY 30 MINUTES BEFORE BREAKFAST; DO NOT MIX WITH VITAMINS OR ACID REFLUX MEDICATIONS STOP  DOSE   Loperamide-Simethicone 2-125 MG TABS Take 1 tablet by mouth daily as needed. May repeat in 1 hour   losartan (COZAAR) 50 MG tablet TAKE 1 TABLET BY MOUTH DAILY   Magnesium ([employer] TRIPLE MAGNESIUM COMPLEX) 400 MG CAPS Take 1 capsule by mouth daily.   Misc. Devices (TUB TRANSFER BOARD) MISC 1 Device by Does not apply route as needed.   montelukast (SINGULAIR) 10 MG tablet TAKE 1 TABLET BY MOUTH NIGHTLY   MOUNJARO 12.5 MG/0.5ML Pen INJECT THE CONTENTS OF ONE PEN UNDER THE SKIN WEEKLY ON THE SAME DAY EACH WEEK   Multiple Vitamin (MULTIVITAMIN WITH MINERALS) TABS tablet Take 1 tablet by mouth daily.   Multiple Vitamins-Minerals (HAIR SKIN & NAILS) TABS Take 1 tablet by mouth daily.   naloxone (NARCAN) nasal spray 4 mg/0.1 mL Place 1 spray into the nose as needed for up to 365 doses (for opioid-induced respiratory depresssion). In case of emergency (overdose), spray once into each nostril. If no response within 3 minutes, repeat application and call 911.   nystatin (MYCOSTATIN/NYSTOP) powder Apply 1 Application topically 3 (three) times daily.   nystatin ointment (MYCOSTATIN) Apply 1 Application topically 2 (two) times daily.   ondansetron (ZOFRAN) 4 MG tablet Take 1 tablet (4 mg total) by mouth every 8 (eight) hours as needed for nausea or vomiting.   pantoprazole (PROTONIX) 40 MG tablet Take 1 tablet (40 mg total) by mouth daily. 30 min before food   Powders (GOLD BOND NO MESS BODY POWDER) AERP Apply 1 application. topically daily as needed. Gold bond talc free powder   pregabalin (LYRICA) 50 MG capsule TAKE 1 CAPSULE BY MOUTH 3 TIMES DAILY   rosuvastatin (CRESTOR) 20 MG tablet TAKE 1 TABLET BY MOUTH DAILY   senna-docusate (SENOKOT-S) 8.6-50 MG tablet Take 1 tablet by mouth at bedtime as needed for mild constipation.   sucralfate (CARAFATE) 1 GM/10ML suspension Take 1 g by mouth 3 (three) times daily.    Vibegron (GEMTESA) 75 MG TABS Take 1 tablet (75 mg total) by mouth daily.   Immunization  History  Administered Date(s) Administered   Fluad Quad(high Dose 65+) 02/07/2020, 02/06/2021, 11/27/2021   Fluad Trivalent(High Dose 65+) 01/12/2023   Influenza, High Dose Seasonal PF 11/28/2014, 02/02/2018, 01/31/2019, 02/20/2019   Influenza, Seasonal, Injecte, Preservative Fre 01/17/2011   Influenza,inj,Quad PF,6+ Mos 12/18/2013   Influenza-Unspecified 12/18/2013, 12/31/2015, 11/27/2016, 02/02/2018   PFIZER(Purple Top)SARS-COV-2 Vaccination 06/29/2019, 07/29/2019, 01/22/2020   Pneumococcal Conjugate-13 11/27/2016   Pneumococcal Polysaccharide-23 01/17/2011, 12/17/2014   Tdap 03/09/2018      Objective:   BP 104/76 (BP Location: Left  Arm, [name] Position: Sitting, Cuff Size: Normal)   Pulse 71   Temp 97.6 F (36.4 C) (Temporal)   Ht 5\' 4"  (1.626 m)   Wt 248 lb 9.6 oz (112.8 kg)   SpO2 98%   BMI 42.67 kg/m   GENERAL: Morbidly obese woman, presents in transport chair, normally uses walker. No respiratory distress. No conversational dyspnea. HEAD: Normocephalic, atraumatic.  EYES: Pupils equal, round, reactive to light. No scleral icterus.  MOUTH: Dentition shows few chipped teeth. Oral mucosa moist. No thrush. Mallampati class III. NECK: Supple. No thyromegaly. Trachea midline. No JVD. No adenopathy. PULMONARY: Good air entry bilaterally, decreased breath sounds on the right base (chronic). Breath sounds coarse with no other adventitious sounds, no wheezing. CARDIOVASCULAR: S1 and S2. Regular rate and rhythm. No rubs, murmurs or gallops heard. ABDOMEN: Protuberant, obese, otherwise benign. MUSCULOSKELETAL: No joint deformity, no clubbing, no lower extremity edema.  NEUROLOGIC: Mild left sided weakness from prior CVA, no change, gait not tested [name] on transport chair SKIN: Intact,warm,dry. PSYCH: Mood and behavior normal.    Assessment & Plan:     ICD-10-CM   1. Moderate persistent asthma without complication  J45.40     2. Chronically elevated hemidiaphragm - RIGHT   J98.6     3. OSA (obstructive sleep apnea)  G47.33     4. Morbid obesity with BMI of 40.0-44.9, adult (HCC)  E66.01    Z68.41     5. Need for influenza vaccination  Z23 Flu Vaccine Trivalent High Dose (Fluad)     Orders Placed This Encounter  Procedures   Flu Vaccine Trivalent High Dose (Fluad)   Assessment and Plan  Moderate Persistent Asthma Stable with no recent exacerbations. No recent use of rescue inhaler. Good compliance with Trelegy 200. -Continue Trelegy 200. -Continue to have rescue inhaler available as needed.  Obstructive Sleep Apnea Good compliance with CPAP therapy. -Continue CPAP therapy.  Shoulder Post-Operative Recent improvement in range of motion following surgery. Some pain noted with movement, likely due to muscle disuse. -Continue physical therapy as directed. -Use home exercise equipment for additional reps as instructed by physical therapy.  General Health Maintenance -Administer influenza vaccine today. -Follow-up in 4-6 months.   Gailen Shelter, MD Advanced Bronchoscopy PCCM [facility]   This note was dictated using voice recognition software/Dragon.  Despite best efforts to proofread, errors can occur which can change the meaning. Any transcriptional errors that result from this process are unintentional and may not be fully corrected at the time of dictation.

## 2023-01-12 NOTE — Progress Notes (Signed)
Subjective:   Patient ID: [name], DOB: Aug 05, 1950, 72 y.o.   MRN: [id number]  Patient Care Team: [name], NP as PCP - General (Nurse Practitioner) Iran Ouch, MD as PCP - Cardiology (Cardiology) Delano Metz, MD as Consulting Physician (Pain Medicine) Marykay Lex, MD as Consulting Physician (Cardiology) Poggi, Excell Seltzer, MD as Consulting Physician (Surgery) Creig Hines, MD as Consulting Physician (Oncology) Signa Kell, MD as Consulting Physician (Orthopedic Surgery)  Chief Complaint  Patient presents with   Follow-up    No shortness of breath, cough or wheezing. Wearing CPAP nightly.    BACKGROUND/INTERVAL: Patient is a 72 year old lifelong never smoker who carries a diagnosis of asthmatic bronchitis who presents for follow-up.  I last saw the patient on 20 October 2022 that time she had completed inpatient rehab after left shoulder surgery.  HPI Discussed the use of AI scribe software for clinical note transcription with the patient, who gave verbal consent to proceed.  History of Present Illness The patient is a 72 year old with a history of moderate persistent asthma/asthmatic bronchitis, obstructive sleep apnea, and morbid obesity. She presents for a follow-up visit. The patient reports a significant improvement in shoulder mobility following surgery, with the ability to raise the arm and wash her hair for the first time in years. However, she experienced significant pain in the elbow region after this activity, which was attributed to underused muscles by her physical therapist.  The patient's breathing has been stable, with no need for a rescue inhaler. She continues to use Trelegy 200, with good compliance and oral hygiene practices post-inhalation. The patient has been using a CPAP machine for over thirty years, with excellent compliance. Recent compliance download shows 100% compliance 30 out of 30 days.  Usage is over 4 hours each night.  Residual  AHI is 3.5.  CPAP is set at 11 cm H2O.  The patient has a history of recurrent bronchitis, with two episodes progressing to pneumonia. However, she has not had an episode in the past three to four years. She recently had her ductwork cleaned, which may contribute to her improved respiratory status.  The patient is satisfied with her current health status and has no additional concerns at this time.   DATA 12/18/2015 sleep study: Obstructive sleep apnea confirmed.  AHI 13.0 baseline and 37.5 during REM sleep. 09/22/2018 PFTs: FEV1 1.42 L or 67% predicted, FVC 1. 77 L or 63% predicted, FEV1/FVC 80%, no bronchodilator response.  Lung volumes mildly reduced, ERV greatly reduced at 12% consistent with obesity.  Diffusion capacity moderately reduced.  No overt obstruction noted, mild/moderate restriction likely due to obesity, known hemidiaphragm elevation. 09/03/2019 echocardiogram: LVEF 60 to 65%, no wall motion abnormality, no evidence of mitral valve regurgitation. 04/07/2020 echocardiogram with bubble study: LVEF 60 to 65%, no wall motion abnormalities.  Mild mitral valve regurgitation.  Mild to moderate tricuspid valve regurgitation.  Negative saline contrast study 06/03/2021 echocardiogram: LVEF 55 to 60%, no wall motion abnormalities, moderately elevated pulmonary artery systolic pressure.  Will size dilated.  Mild mitral regurgitation.  Moderate tricuspid valve regurgitation.  Right atrial pressure estimated at 8 mmHg. 08/11/2022 PFTs: FEV1 1.34 L or 63% predicted, FVC 1.50 L or 53% predicted, FEV1/FVC 89%, no bronchodilator response.  Lung volumes are moderately reduced.  Diffusion capacity moderately reduced and corrects partially for alveolar volume.  Overall no overt significant change from PFTs 2020.   Review of Systems A 10 point review of systems was performed and it is as  noted above otherwise negative.   Patient Active Problem List   Diagnosis Date Noted   Pressure ulcer of left heel,  stage 3 (HCC) 10/07/2022   S/P reverse total shoulder arthroplasty, right 09/02/2022   S/p reverse total shoulder arthroplasty 09/01/2022   Cutaneous candidiasis 08/13/2022   Yeast vaginitis 07/16/2022   Type 2 diabetes mellitus with hyperglycemia, without long-term current use of insulin (HCC) 06/16/2022   Bronchitis 03/26/2022   BMI 40.0-44.9, adult (HCC) 12/01/2021   Stage 3b chronic kidney disease (HCC) 12/01/2021   Dysuria 11/13/2021   Abnormal MRI, shoulder 10/29/2021   SOB (shortness of breath) 06/06/2021   Iron deficiency anemia 03/27/2021   Recurrent strokes (HCC) 02/26/2021   Chronic shoulder pain (Bilateral) 02/10/2021   Impaired range of motion of shoulder (Left) 02/10/2021   Hyperlipidemia LDL goal <70 01/28/2021   Allergic rhinitis 08/22/2020   Uncomplicated opioid dependence (HCC) 07/15/2020   Cognitive deficit, post-stroke 06/22/2020   Peripheral neurogenic pain 06/20/2020   Chronic peripheral neuropathic pain 06/20/2020   Chronic use of opiate for therapeutic purpose 06/20/2020   History of cerebrovascular accident (CVA) due to embolism 06/19/2020   History of stroke with current residual effects 06/05/2020   ANA positive 04/18/2020   Nonsustained ventricular tachycardia (HCC) 04/09/2020   B12 deficiency 03/11/2020   Ulcer of left heel and midfoot (HCC) 03/11/2020   PAD (peripheral artery disease) (HCC) 03/11/2020   Rotator cuff disorder, left 03/01/2020   Cerebrovascular accident (CVA) (HCC) 03/01/2020   Primary osteoarthritis of left shoulder 02/12/2020   Folate deficiency 02/08/2020   Vitamin D deficiency 02/08/2020   Malnutrition of moderate degree 02/06/2020   Left hemiparesis (HCC) 12/28/2019   Chronic bacteriuria 11/17/2019   Hyperkalemia 09/14/2019   Bacteremia due to Gram-positive bacteria 09/08/2019   Pressure injury of skin 09/04/2019   COPD with acute exacerbation (HCC) 09/02/2019   Pharmacologic therapy 08/30/2019   Recurrent falls 07/10/2019    Left ankle pain 06/02/2019   Chronic pain of right ankle 02/14/2019   Arthritis 02/14/2019   Meralgia paresthetica (Right) 12/05/2018   Chronic anticoagulation (Eliquis) 12/05/2018   Chronic ulcer of right leg (HCC) 10/18/2018   Fibromyalgia syndrome 08/17/2018   Chronic musculoskeletal pain 08/17/2018   Tremor of right hand 08/03/2018   Overactive bladder 08/03/2018   Physical deconditioning 08/03/2018   Recurrent UTI 07/29/2018   Foot ulcer, left (HCC) 03/09/2018   Adrenal mass (HCC) 08/02/2017   Eczema 06/27/2017   Mass of both adrenal glands (HCC) 05/16/2017   Fatty liver 05/13/2017   Hip strain (Right) 04/30/2017   Ischial pain, right 04/30/2017   Hip joint pain (Left) 04/22/2017   Abnormality of gait and mobility 04/22/2017   Muscle strain of hip (Left) 04/09/2017   Pain in joint involving ankle and foot 11/18/2016   Kidney stones 11/18/2016   Spinal stenosis of thoracic region 11/18/2016   Abscess of tendon of left foot 09/21/2016   Cubital tunnel syndrome (Right) 08/07/2016   Chronic pain syndrome 02/25/2016   Long term current use of opiate analgesic 02/25/2016   Lymphedema 12/26/2015   Status post reverse total shoulder replacement 10/08/2015   Neuropathy of lateral femoral cutaneous nerve (Right) 09/23/2015   Obesity, Class III, BMI 40-49.9 (morbid obesity) (HCC) 09/23/2015   Chronic prescription benzodiazepine use 09/23/2015   Arthralgia 09/23/2015   Osteoarthritis involving multiple joints 09/23/2015   Other shoulder lesions (Right) 08/28/2015   Cervical radiculitis 08/06/2015   Opiate use (15 MME/Day) 01/16/2015   Impingement syndrome of shoulder region  01/07/2015   Mixed incontinence 11/26/2014   Atrophic vaginitis 11/26/2014   Depression with anxiety 09/21/2014   Bladder cystocele 09/21/2014   Gastro-esophageal reflux disease without esophagitis 09/21/2014   At high risk for falls 09/21/2014   Diabetic peripheral neuropathy associated with type 2  diabetes mellitus (HCC) 08/31/2014   Asthma, well controlled 08/31/2014   Hypothyroidism 08/31/2014   Peripheral vascular disease due to secondary diabetes mellitus (HCC) 12/11/2013   OSA (obstructive sleep apnea) 10/19/2013   Essential hypertension 07/27/2013   Chronic atrial fibrillation 08/10/2012   Social History   Tobacco Use   Smoking status: Never   Smokeless tobacco: Never  Substance Use Topics   Alcohol use: No    Alcohol/week: 0.0 standard drinks of alcohol   Allergies  Allergen Reactions   Morphine And Codeine Other (See Comments) and Nausea Only    Patient becomes very confused Other reaction(s): Other (See Comments), Other (See Comments) Patient becomes very confused Patient becomes very confused    Penicillins Hives, Shortness Of Breath, Swelling and Other (See Comments)    Tolerates Keflex and Ceftriaxone   Ambien [Zolpidem]     Hallucination    Aspirin Hives   Oxytetracycline Hives    Other reaction(s): Unknown    Current Meds  Medication Sig   acetaminophen (TYLENOL) 500 MG tablet Take 2 tablets (1,000 mg total) by mouth every 8 (eight) hours.   albuterol (VENTOLIN HFA) 108 (90 Base) MCG/ACT inhaler Inhale 2 puffs into the lungs every 6 (six) hours as needed.   ALPRAZolam (XANAX) 0.25 MG tablet Take 1 tablet (0.25 mg total) by mouth 2 (two) times daily as needed for anxiety. See SIG change was taking 1/2 of 0.5 tablet bid prn changed to 0.25 bid prn. D/c 0.5 xanax   buPROPion (WELLBUTRIN XL) 150 MG 24 hr tablet Take 1 tablet (150 mg total) by mouth daily.   calcium-vitamin D (OSCAL WITH D) 500-200 MG-UNIT tablet Take 1 tablet by mouth 2 (two) times daily with a meal.   cephALEXin (KEFLEX) 250 MG capsule Take 1 capsule (250 mg total) by mouth daily.   clobetasol cream (TEMOVATE) 0.05 % Apply 1 application topically 2 (two) times daily as needed (skin irritation).    colchicine 0.6 MG tablet Take 0.6 mg by mouth daily. Repeat I 1 hour if needed. No more than  3 tablets in 24 hours PRN for GOUT FLARE   Continuous Glucose Sensor (FREESTYLE LIBRE 3 SENSOR) MISC    cyanocobalamin (VITAMIN B12) 1000 MCG tablet Take 1,000 mcg by mouth daily.   diclofenac Sodium (VOLTAREN) 1 % GEL Apply 2 g topically 3 (three) times daily.   DULoxetine (CYMBALTA) 60 MG capsule Take 1 capsule (60 mg total) by mouth daily. (Patient taking differently: Take 60 mg by mouth daily. 6 pm)   ELIQUIS 5 MG TABS tablet TAKE 1 TABLET BY MOUTH TWICE DAILY   empagliflozin (JARDIANCE) 25 MG TABS tablet Take 1 tablet (25 mg total) by mouth daily before breakfast.   ergocalciferol (VITAMIN D2) 1.25 MG (50000 UT) capsule Take 50,000 Units by mouth once a week.   estradiol (ESTRACE) 0.1 MG/GM vaginal cream Apply 0.5mg  (pea-sized amount)  just inside the vaginal introitus with a finger-tip on Monday, Wednesday and Friday nights,   ferrous sulfate 324 MG TBEC Take 324 mg by mouth daily with breakfast.   fluconazole (DIFLUCAN) 150 MG tablet Take 1 tablet by mouth x 1 dose. May repeat in 72 hours if needed.   Fluticasone-Umeclidin-Vilant (TRELEGY ELLIPTA) 200-62.5-25  MCG/ACT AEPB INHALE ONE PUFF INTO THE LUNGS EVERY DAY   folic acid (FOLVITE) 800 MCG tablet Take 800 mcg by mouth daily.   furosemide (LASIX) 40 MG tablet Take 1 tablet (40 mg total) by mouth as directed. Take 1 tablet daily. May take additional 1 tablet If weight gain of greater than 2 pounds in 24 hours or 5 pounds in one week.   glucose blood (ONETOUCH ULTRA) test strip TEST TWICE DAILY AS DIRECTED   ipratropium-albuterol (DUONEB) 0.5-2.5 (3) MG/3ML SOLN Take 3 mLs by nebulization every 6 (six) hours as needed.   Lancets (ONETOUCH ULTRASOFT) lancets Use as instructed   levocetirizine (XYZAL) 5 MG tablet TAKE ONE TABLET BY MOUTH AT BEDTIME AS NEEDED FOR ALLERGIES   levothyroxine (SYNTHROID) 137 MCG tablet TAKE ONE (1) TABLET BY MOUTH DAILY 30 MINUTES BEFORE BREAKFAST; DO NOT MIX WITH VITAMINS OR ACID REFLUX MEDICATIONS STOP  DOSE   Loperamide-Simethicone 2-125 MG TABS Take 1 tablet by mouth daily as needed. May repeat in 1 hour   losartan (COZAAR) 50 MG tablet TAKE 1 TABLET BY MOUTH DAILY   Magnesium ([employer] TRIPLE MAGNESIUM COMPLEX) 400 MG CAPS Take 1 capsule by mouth daily.   Misc. Devices (TUB TRANSFER BOARD) MISC 1 Device by Does not apply route as needed.   montelukast (SINGULAIR) 10 MG tablet TAKE 1 TABLET BY MOUTH NIGHTLY   MOUNJARO 12.5 MG/0.5ML Pen INJECT THE CONTENTS OF ONE PEN UNDER THE SKIN WEEKLY ON THE SAME DAY EACH WEEK   Multiple Vitamin (MULTIVITAMIN WITH MINERALS) TABS tablet Take 1 tablet by mouth daily.   Multiple Vitamins-Minerals (HAIR SKIN & NAILS) TABS Take 1 tablet by mouth daily.   naloxone (NARCAN) nasal spray 4 mg/0.1 mL Place 1 spray into the nose as needed for up to 365 doses (for opioid-induced respiratory depresssion). In case of emergency (overdose), spray once into each nostril. If no response within 3 minutes, repeat application and call 911.   nystatin (MYCOSTATIN/NYSTOP) powder Apply 1 Application topically 3 (three) times daily.   nystatin ointment (MYCOSTATIN) Apply 1 Application topically 2 (two) times daily.   ondansetron (ZOFRAN) 4 MG tablet Take 1 tablet (4 mg total) by mouth every 8 (eight) hours as needed for nausea or vomiting.   pantoprazole (PROTONIX) 40 MG tablet Take 1 tablet (40 mg total) by mouth daily. 30 min before food   Powders (GOLD BOND NO MESS BODY POWDER) AERP Apply 1 application. topically daily as needed. Gold bond talc free powder   pregabalin (LYRICA) 50 MG capsule TAKE 1 CAPSULE BY MOUTH 3 TIMES DAILY   rosuvastatin (CRESTOR) 20 MG tablet TAKE 1 TABLET BY MOUTH DAILY   senna-docusate (SENOKOT-S) 8.6-50 MG tablet Take 1 tablet by mouth at bedtime as needed for mild constipation.   sucralfate (CARAFATE) 1 GM/10ML suspension Take 1 g by mouth 3 (three) times daily.    Vibegron (GEMTESA) 75 MG TABS Take 1 tablet (75 mg total) by mouth daily.   Immunization  History  Administered Date(s) Administered   Fluad Quad(high Dose 65+) 02/07/2020, 02/06/2021, 11/27/2021   Fluad Trivalent(High Dose 65+) 01/12/2023   Influenza, High Dose Seasonal PF 11/28/2014, 02/02/2018, 01/31/2019, 02/20/2019   Influenza, Seasonal, Injecte, Preservative Fre 01/17/2011   Influenza,inj,Quad PF,6+ Mos 12/18/2013   Influenza-Unspecified 12/18/2013, 12/31/2015, 11/27/2016, 02/02/2018   PFIZER(Purple Top)SARS-COV-2 Vaccination 06/29/2019, 07/29/2019, 01/22/2020   Pneumococcal Conjugate-13 11/27/2016   Pneumococcal Polysaccharide-23 01/17/2011, 12/17/2014   Tdap 03/09/2018      Objective:   BP 104/76 (BP Location: Left  Arm, Patient Position: Sitting, Cuff Size: Normal)   Pulse 71   Temp 97.6 F (36.4 C) (Temporal)   Ht 5\' 4"  (1.626 m)   Wt 248 lb 9.6 oz (112.8 kg)   SpO2 98%   BMI 42.67 kg/m   GENERAL: Morbidly obese woman, presents in transport chair, normally uses walker. No respiratory distress. No conversational dyspnea. HEAD: Normocephalic, atraumatic.  EYES: Pupils equal, round, reactive to light. No scleral icterus.  MOUTH: Dentition shows few chipped teeth. Oral mucosa moist. No thrush. Mallampati class III. NECK: Supple. No thyromegaly. Trachea midline. No JVD. No adenopathy. PULMONARY: Good air entry bilaterally, decreased breath sounds on the right base (chronic). Breath sounds coarse with no other adventitious sounds, no wheezing. CARDIOVASCULAR: S1 and S2. Regular rate and rhythm. No rubs, murmurs or gallops heard. ABDOMEN: Protuberant, obese, otherwise benign. MUSCULOSKELETAL: No joint deformity, no clubbing, no lower extremity edema.  NEUROLOGIC: Mild left sided weakness from prior CVA, no change, gait not tested patient on transport chair SKIN: Intact,warm,dry. PSYCH: Mood and behavior normal.    Assessment & Plan:     ICD-10-CM   1. Moderate persistent asthma without complication  J45.40     2. Chronically elevated hemidiaphragm - RIGHT   J98.6     3. OSA (obstructive sleep apnea)  G47.33     4. Morbid obesity with BMI of 40.0-44.9, adult (HCC)  E66.01    Z68.41     5. Need for influenza vaccination  Z23 Flu Vaccine Trivalent High Dose (Fluad)     Orders Placed This Encounter  Procedures   Flu Vaccine Trivalent High Dose (Fluad)   Assessment and Plan  Moderate Persistent Asthma Stable with no recent exacerbations. No recent use of rescue inhaler. Good compliance with Trelegy 200. -Continue Trelegy 200. -Continue to have rescue inhaler available as needed.  Obstructive Sleep Apnea Good compliance with CPAP therapy. -Continue CPAP therapy.  Shoulder Post-Operative Recent improvement in range of motion following surgery. Some pain noted with movement, likely due to muscle disuse. -Continue physical therapy as directed. -Use home exercise equipment for additional reps as instructed by physical therapy.  General Health Maintenance -Administer influenza vaccine today. -Follow-up in 4-6 months.   Gailen Shelter, MD Advanced Bronchoscopy PCCM [facility]   This note was dictated using voice recognition software/Dragon.  Despite best efforts to proofread, errors can occur which can change the meaning. Any transcriptional errors that result from this process are unintentional and may not be fully corrected at the time of dictation.

## 2023-01-13 DIAGNOSIS — L97822 Non-pressure chronic ulcer of other part of left lower leg with fat layer exposed: Secondary | ICD-10-CM | POA: Diagnosis not present

## 2023-01-13 DIAGNOSIS — L89623 Pressure ulcer of left heel, stage 3: Secondary | ICD-10-CM | POA: Diagnosis not present

## 2023-01-14 DIAGNOSIS — M25512 Pain in left shoulder: Secondary | ICD-10-CM | POA: Diagnosis not present

## 2023-01-14 DIAGNOSIS — Z96612 Presence of left artificial shoulder joint: Secondary | ICD-10-CM | POA: Diagnosis not present

## 2023-01-18 IMAGING — CR DG TOE 4TH 2+V*L*
3 series · 3 of 3 positions shown · non-contrast
Comparison: None.

CLINICAL DATA: Left fourth toe pain and swelling.

EXAM:
LEFT FOURTH TOE

[toe ap]
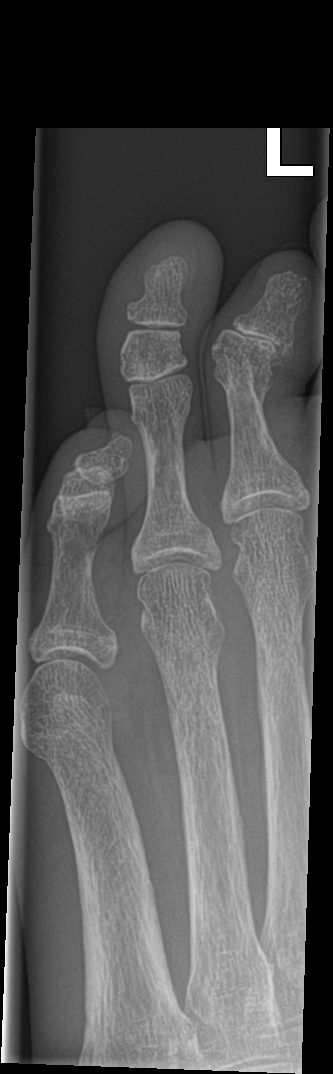

[toe obl]
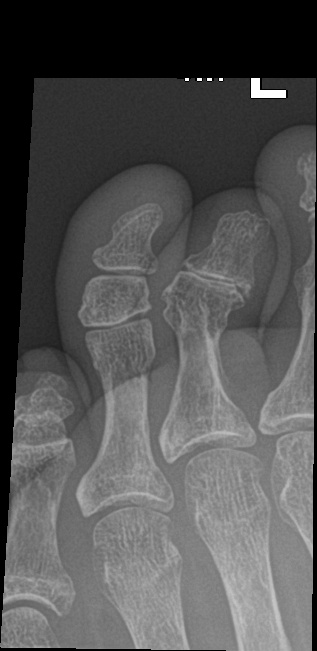

[toe lat]
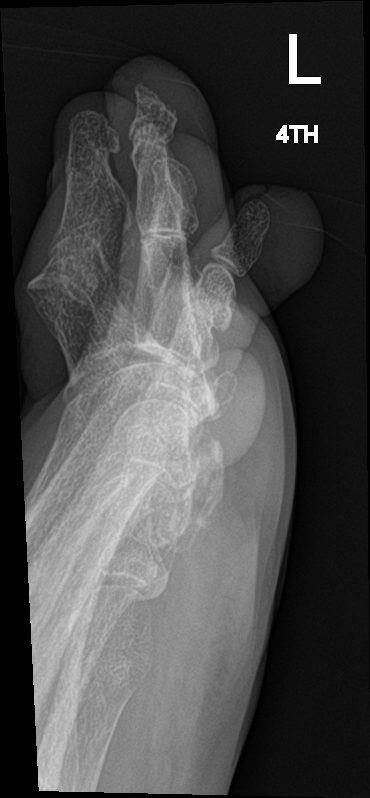

[3 of 3 positions shown; findings below may reference images not displayed]

FINDINGS: There is no evidence of fracture or dislocation. There is no
evidence of arthropathy or other focal bone abnormality. Soft
tissues are unremarkable.
IMPRESSION: Negative.

## 2023-01-18 IMAGING — CR DG CHEST 2V
2 series · 3 of 3 positions shown · non-contrast
Comparison: April 05, 2020.

CLINICAL DATA: Wheezing.

EXAM:
CHEST - 2 VIEW

[Series 2: chest lat · 0.14mm/px · 2 of 2 slices shown]
[im 1/2]
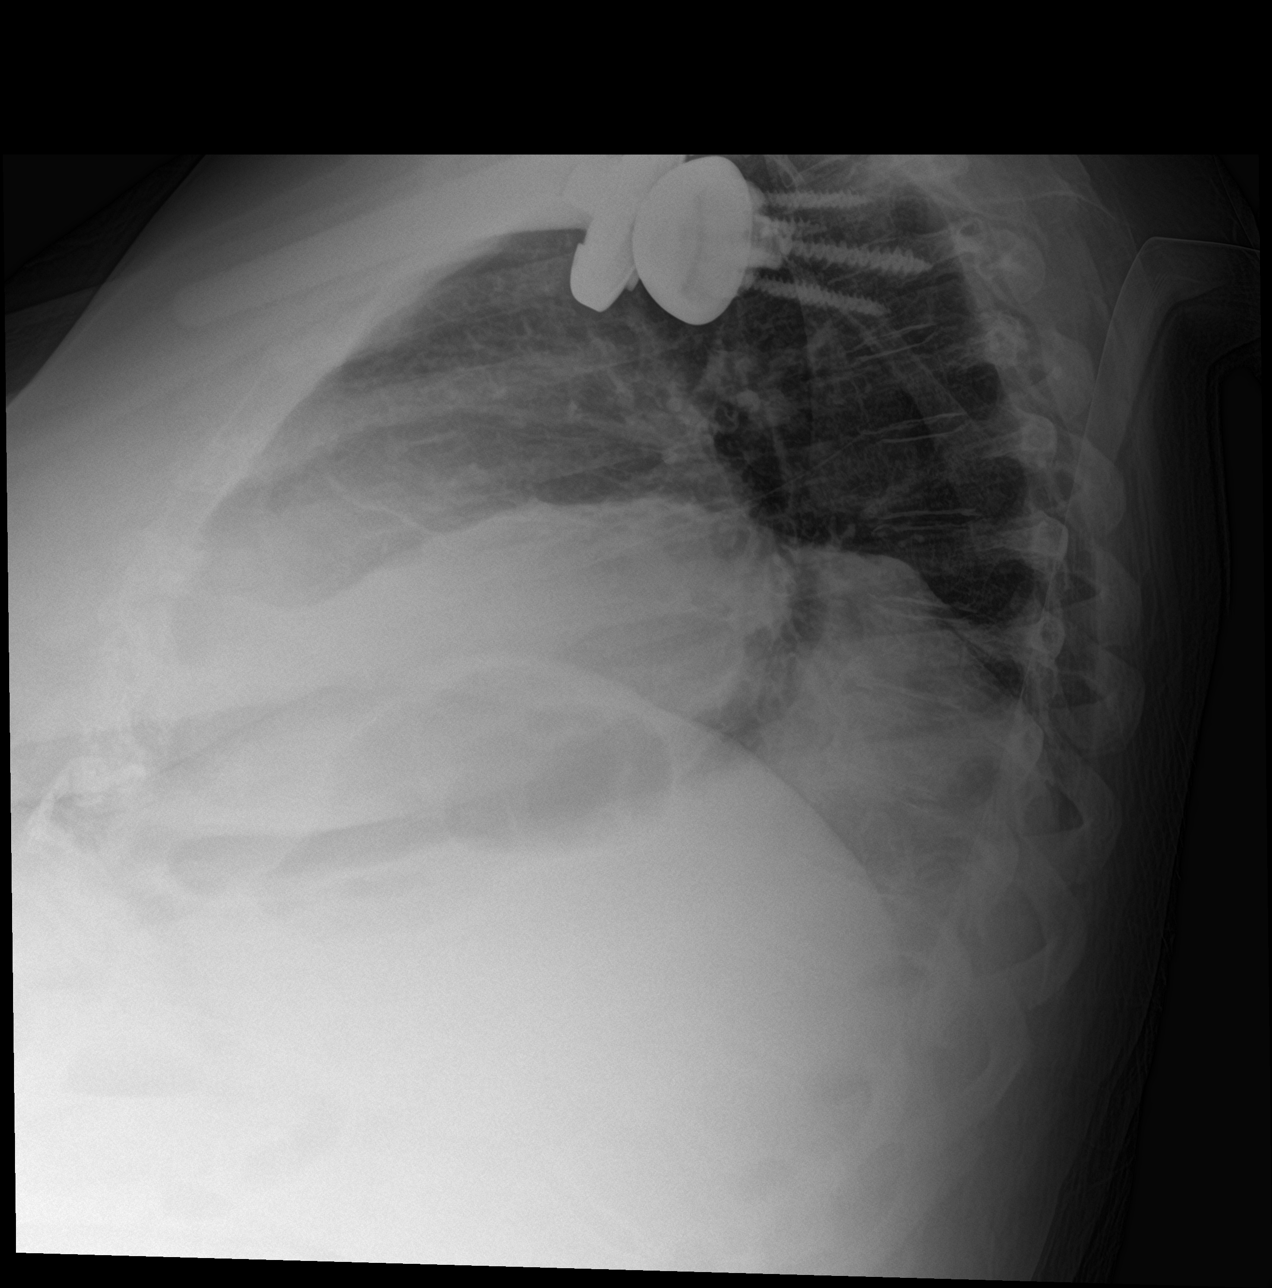
[im 2/2]
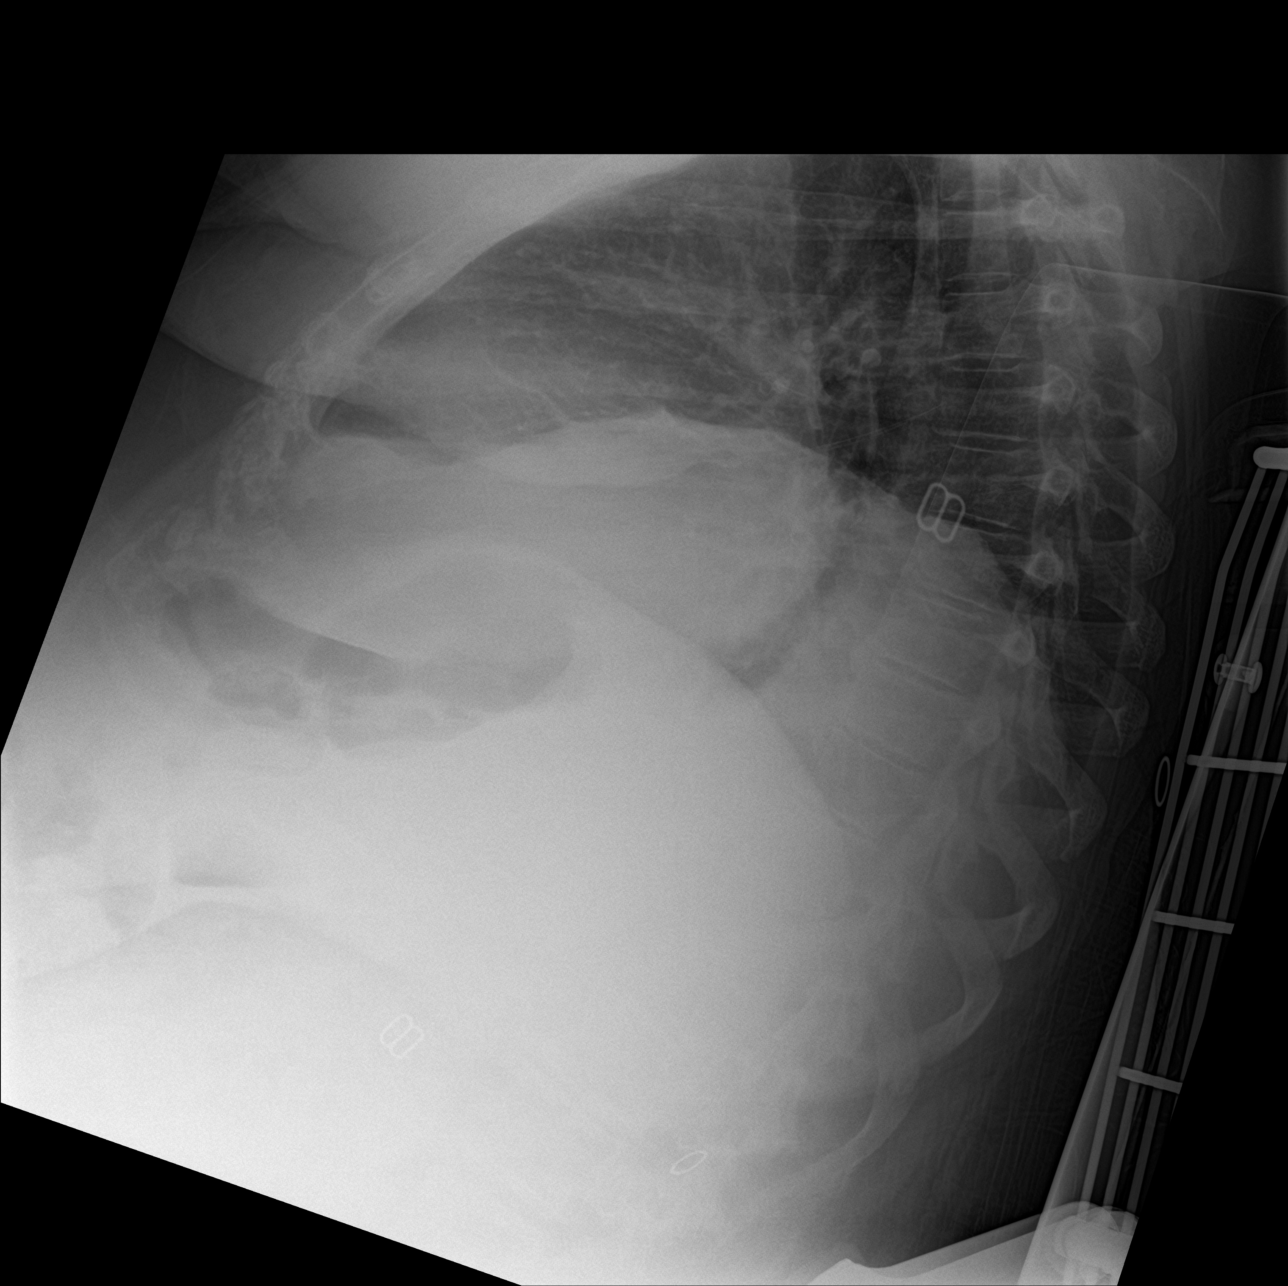

[chest ap]
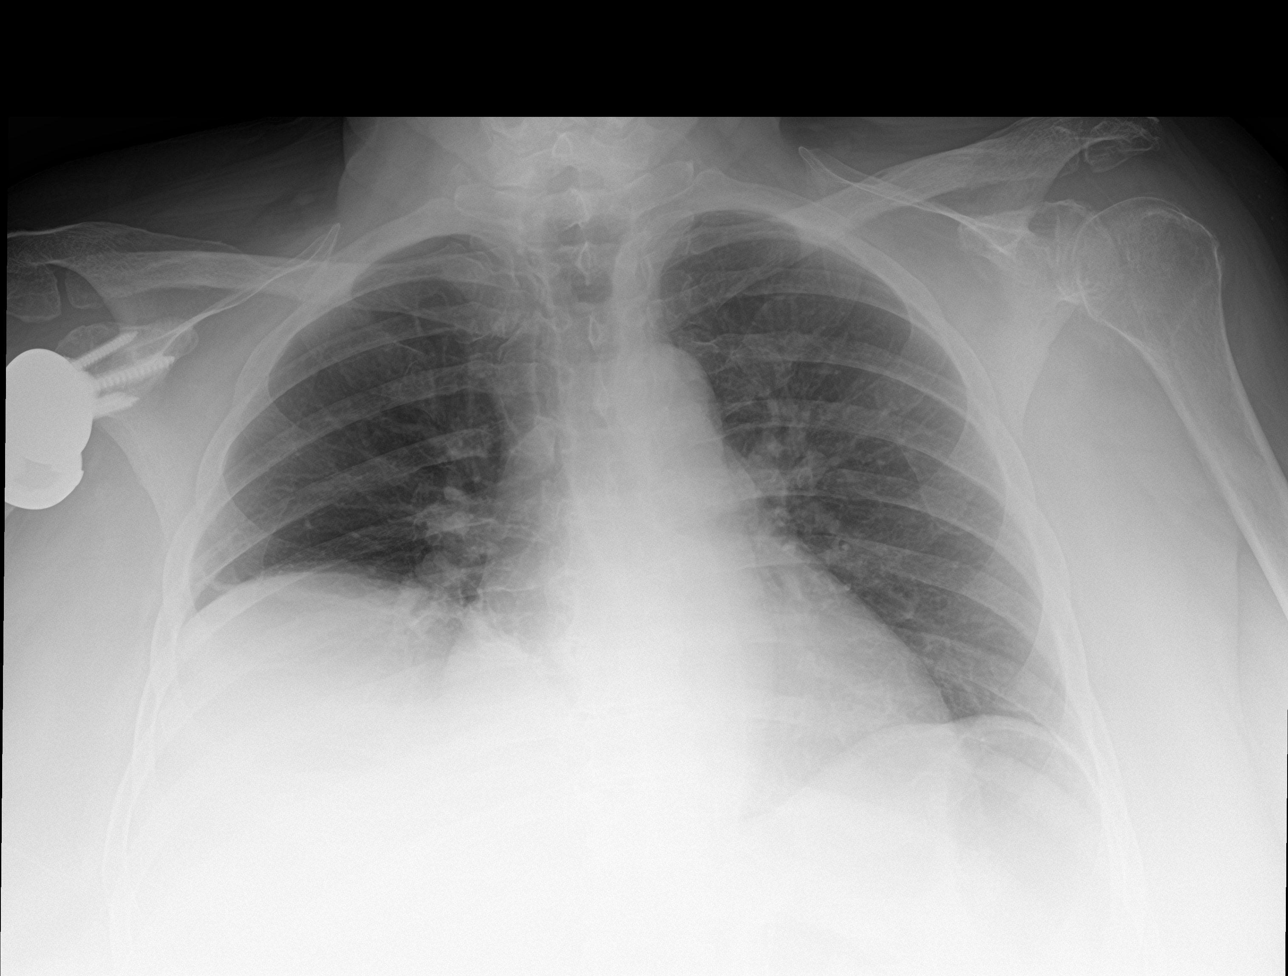

[3 of 3 positions shown; findings below may reference images not displayed]

FINDINGS: The heart size and mediastinal contours are within normal limits.
Status post right total shoulder arthroplasty. Left lung is clear.
Stable elevated right hemidiaphragm is noted with minimal right
basilar subsegmental atelectasis or scarring.
IMPRESSION: Stable elevated right hemidiaphragm with minimal right basilar
subsegmental atelectasis or scarring.

## 2023-01-20 ENCOUNTER — Encounter: Payer: Self-pay | Admitting: Nurse Practitioner

## 2023-01-20 ENCOUNTER — Ambulatory Visit (INDEPENDENT_AMBULATORY_CARE_PROVIDER_SITE_OTHER): Payer: Medicare HMO | Admitting: Nurse Practitioner

## 2023-01-20 VITALS — BP 120/80 | HR 88 | Temp 97.8°F | Ht 64.0 in | Wt 253.0 lb

## 2023-01-20 DIAGNOSIS — E1142 Type 2 diabetes mellitus with diabetic polyneuropathy: Secondary | ICD-10-CM

## 2023-01-20 DIAGNOSIS — E1165 Type 2 diabetes mellitus with hyperglycemia: Secondary | ICD-10-CM

## 2023-01-20 DIAGNOSIS — E785 Hyperlipidemia, unspecified: Secondary | ICD-10-CM | POA: Diagnosis not present

## 2023-01-20 DIAGNOSIS — Z7985 Long-term (current) use of injectable non-insulin antidiabetic drugs: Secondary | ICD-10-CM | POA: Diagnosis not present

## 2023-01-20 DIAGNOSIS — Z7984 Long term (current) use of oral hypoglycemic drugs: Secondary | ICD-10-CM | POA: Diagnosis not present

## 2023-01-20 DIAGNOSIS — Z96612 Presence of left artificial shoulder joint: Secondary | ICD-10-CM

## 2023-01-20 DIAGNOSIS — M25512 Pain in left shoulder: Secondary | ICD-10-CM | POA: Diagnosis not present

## 2023-01-20 DIAGNOSIS — I10 Essential (primary) hypertension: Secondary | ICD-10-CM | POA: Diagnosis not present

## 2023-01-20 DIAGNOSIS — N1832 Chronic kidney disease, stage 3b: Secondary | ICD-10-CM | POA: Diagnosis not present

## 2023-01-20 DIAGNOSIS — M79644 Pain in right finger(s): Secondary | ICD-10-CM | POA: Insufficient documentation

## 2023-01-20 DIAGNOSIS — E039 Hypothyroidism, unspecified: Secondary | ICD-10-CM | POA: Diagnosis not present

## 2023-01-20 MED ORDER — PREGABALIN 75 MG PO CAPS
75.0000 mg | ORAL_CAPSULE | Freq: Three times a day (TID) | ORAL | 5 refills | Status: DC
Start: 2023-01-20 — End: 2023-08-04

## 2023-01-20 MED ORDER — TIRZEPATIDE 15 MG/0.5ML ~~LOC~~ SOAJ
15.0000 mg | SUBCUTANEOUS | 3 refills | Status: DC
Start: 2023-01-20 — End: 2023-12-24

## 2023-01-20 NOTE — Progress Notes (Unsigned)
Dawn Dicker, NP-C Phone: [phone]  [name] is a 72 y.o. adult who presents today for follow up.   HYPERTENSION Disease Monitoring: Blood pressure range- Not checking Chest pain- No      Dyspnea- No Medications: Compliance- Losartan  Lightheadedness- No   Edema- No  Lab Results  Component Value Date   NA 142 01/20/2023   K 4.2 01/20/2023   CO2 27 01/20/2023   GLUCOSE 101 (H) 01/20/2023   BUN 24 (H) 01/20/2023   CREATININE 1.51 (H) 01/20/2023   CALCIUM 9.1 01/20/2023   EGFR 53 (L) 06/20/2021   GFRNONAA 39 (L) 09/02/2022    DIABETES-  She has been on Mounjaro 12.5 mg weekly for 4 months, she is interested in increasing her dose.  Disease Monitoring: Blood Sugar ranges- FreeStyle Libre Sensor 7 day average- 155 Polyuria/phagia/dipsia- No      Optho- Yes Medications: Compliance- Mounjaro and Jardiance Hypoglycemic symptoms- No  Lab Results  Component Value Date   HGBA1C 7.5 (H) 01/20/2023     HYPERLIPIDEMIA Disease Monitoring: See symptoms for Hypertension Medications: Compliance- Crestor Right upper quadrant pain- No  Muscle aches- No  Lab Results  Component Value Date   CHOL 126 01/20/2023   HDL 75.30 01/20/2023   LDLCALC 29 01/20/2023   TRIG 109.0 01/20/2023   CHOLHDL 2 01/20/2023    HYPOTHYROIDISM Disease Monitoring Weight changes: No  Skin Changes: No Palpitations: No Heat/Cold intolerance: Cold Medication Monitoring Compliance:  Levothyroxine   Last TSH:  12/02/2022- 2.864 Lab Results  Component Value Date   TSH 4.63 01/20/2023   She has had an increase in her neuropathy for the past 3-4 weeks. It is worse at night. She has been walking more and feels that is contributing to an increase in symptoms. She is currently on Lyrica 50 mg TID. She is requesting to increase her dose. She is no longer seeing pain management.   She is also concerned she may have gout in her right middle finger. She has had pain with bending it, mostly at the middle  knuckle for one month. She took 2 colchicine tablets that she had at home which helped with her pain and made her able to bend her finger more. She denies erythema, swelling or warmth. She also has a history of osteoarthritis and is unsure if this is contributing to her pain.   Social History   Tobacco Use  Smoking Status Never  Smokeless Tobacco Never    Current Outpatient Medications on File Prior to Visit  Medication Sig Dispense Refill   acetaminophen (TYLENOL) 500 MG tablet Take 2 tablets (1,000 mg total) by mouth every 8 (eight) hours. 30 tablet 0   albuterol (VENTOLIN HFA) 108 (90 Base) MCG/ACT inhaler Inhale 2 puffs into the lungs every 6 (six) hours as needed. 8 g 4   ALPRAZolam (XANAX) 0.25 MG tablet Take 1 tablet (0.25 mg total) by mouth 2 (two) times daily as needed for anxiety. See SIG change was taking 1/2 of 0.5 tablet bid prn changed to 0.25 bid prn. D/c 0.5 xanax 60 tablet 2   buPROPion (WELLBUTRIN XL) 150 MG 24 hr tablet Take 1 tablet (150 mg total) by mouth daily. 90 tablet 3   calcium-vitamin D (OSCAL WITH D) 500-200 MG-UNIT tablet Take 1 tablet by mouth 2 (two) times daily with a meal. 60 tablet 0   cephALEXin (KEFLEX) 250 MG capsule Take 1 capsule (250 mg total) by mouth daily. 90 capsule 1   clobetasol cream (TEMOVATE) 0.05 %  Apply 1 application topically 2 (two) times daily as needed (skin irritation).      Continuous Glucose Sensor (FREESTYLE LIBRE 3 SENSOR) MISC      cyanocobalamin (VITAMIN B12) 1000 MCG tablet Take 1,000 mcg by mouth daily.     diclofenac Sodium (VOLTAREN) 1 % GEL Apply 2 g topically 3 (three) times daily. 100 g 2   DULoxetine (CYMBALTA) 60 MG capsule Take 1 capsule (60 mg total) by mouth daily. (Patient taking differently: Take 60 mg by mouth daily. 6 pm) 90 capsule 3   ELIQUIS 5 MG TABS tablet TAKE 1 TABLET BY MOUTH TWICE DAILY 180 tablet 1   empagliflozin (JARDIANCE) 25 MG TABS tablet Take 1 tablet (25 mg total) by mouth daily before breakfast. 90  tablet 3   ergocalciferol (VITAMIN D2) 1.25 MG (50000 UT) capsule Take 50,000 Units by mouth once a week.     estradiol (ESTRACE) 0.1 MG/GM vaginal cream Apply 0.5mg  (pea-sized amount)  just inside the vaginal introitus with a finger-tip on Monday, Wednesday and Friday nights, 42.5 g 11   ferrous sulfate 324 MG TBEC Take 324 mg by mouth daily with breakfast.     fluconazole (DIFLUCAN) 150 MG tablet Take 1 tablet by mouth x 1 dose. May repeat in 72 hours if needed. 3 tablet 1   Fluticasone-Umeclidin-Vilant (TRELEGY ELLIPTA) 200-62.5-25 MCG/ACT AEPB INHALE ONE PUFF INTO THE LUNGS EVERY DAY 60 each 5   folic acid (FOLVITE) 800 MCG tablet Take 800 mcg by mouth daily.     furosemide (LASIX) 40 MG tablet Take 1 tablet (40 mg total) by mouth as directed. Take 1 tablet daily. May take additional 1 tablet If weight gain of greater than 2 pounds in 24 hours or 5 pounds in one week. 90 tablet 3   glucose blood (ONETOUCH ULTRA) test strip TEST TWICE DAILY AS DIRECTED 100 each 11   ipratropium-albuterol (DUONEB) 0.5-2.5 (3) MG/3ML SOLN Take 3 mLs by nebulization every 6 (six) hours as needed.     Lancets (ONETOUCH ULTRASOFT) lancets Use as instructed 100 each 12   levocetirizine (XYZAL) 5 MG tablet TAKE ONE TABLET BY MOUTH AT BEDTIME AS NEEDED FOR ALLERGIES 90 tablet 3   levothyroxine (SYNTHROID) 137 MCG tablet TAKE ONE (1) TABLET BY MOUTH DAILY 30 MINUTES BEFORE BREAKFAST; DO NOT MIX WITH VITAMINS OR ACID REFLUX MEDICATIONS**STOP DOSE*** 90 tablet 3   Loperamide-Simethicone 2-125 MG TABS Take 1 tablet by mouth daily as needed. May repeat in 1 hour     losartan (COZAAR) 50 MG tablet TAKE 1 TABLET BY MOUTH DAILY 90 tablet 3   Magnesium ([employer] TRIPLE MAGNESIUM COMPLEX) 400 MG CAPS Take 1 capsule by mouth daily.     Misc. Devices (TUB TRANSFER BOARD) MISC 1 Device by Does not apply route as needed. 1 each 0   montelukast (SINGULAIR) 10 MG tablet TAKE 1 TABLET BY MOUTH NIGHTLY 90 tablet 3   Multiple  Vitamins-Minerals (HAIR SKIN & NAILS) TABS Take 1 tablet by mouth daily.     naloxone (NARCAN) nasal spray 4 mg/0.1 mL Place 1 spray into the nose as needed for up to 365 doses (for opioid-induced respiratory depresssion). In case of emergency (overdose), spray once into each nostril. If no response within 3 minutes, repeat application and call 911. 1 each 0   ondansetron (ZOFRAN) 4 MG tablet Take 1 tablet (4 mg total) by mouth every 8 (eight) hours as needed for nausea or vomiting. 90 tablet 1   pantoprazole (PROTONIX) 40  MG tablet Take 1 tablet (40 mg total) by mouth daily. 30 min before food 90 tablet 3   rosuvastatin (CRESTOR) 20 MG tablet TAKE 1 TABLET BY MOUTH DAILY 90 tablet 1   senna-docusate (SENOKOT-S) 8.6-50 MG tablet Take 1 tablet by mouth at bedtime as needed for mild constipation. 10 tablet 0   Vibegron (GEMTESA) 75 MG TABS Take 1 tablet (75 mg total) by mouth daily. 30 tablet 11   No current facility-administered medications on file prior to visit.    ROS see history of present illness  Objective  Physical Exam Vitals:   01/20/23 1524  BP: 120/80  Pulse: 88  Temp: 97.8 F (36.6 C)  SpO2: 95%    BP Readings from Last 3 Encounters:  01/20/23 120/80  01/12/23 104/76  10/20/22 110/70   Wt Readings from Last 3 Encounters:  01/20/23 253 lb (114.8 kg)  01/12/23 248 lb 9.6 oz (112.8 kg)  10/20/22 251 lb (113.9 kg)    Physical Exam Constitutional:      General: She is not in acute distress.    Appearance: Normal appearance.  HENT:     Head: Normocephalic.  Cardiovascular:     Rate and Rhythm: Normal rate and regular rhythm.     Heart sounds: Normal heart sounds.  Pulmonary:     Effort: Pulmonary effort is normal.     Breath sounds: Normal breath sounds.  Musculoskeletal:     Right hand: No swelling or tenderness. Decreased range of motion (right middle finger).     Left hand: Normal.  Skin:    General: Skin is warm and dry.  Neurological:     General: No  focal deficit present.     Mental Status: She is alert.  Psychiatric:        Mood and Affect: Mood normal.        Behavior: Behavior normal.    Assessment/Plan: Please see individual problem list.  Diabetic peripheral neuropathy associated with type 2 diabetes mellitus (HCC) Assessment & Plan: Chronic. Worsening neuropathy x 3-4 weeks, mostly at night. She has been walking and become more mobile recently. Will increase Lyrica to 75 mg TID. PDMP reviewed. She will contact if her symptoms persist despite increasing her Lyrica.   Orders: -     Pregabalin; Take 1 capsule (75 mg total) by mouth 3 (three) times daily.  Dispense: 90 capsule; Refill: 5  Pain of right middle finger Assessment & Plan: Pain with bending only. No erythema, warmth or swelling present. Pain and ROM improved with colchicine, however suspect osteoarthritis. Will check uric acid level to rule out gout.   Orders: -     Uric acid  Status post reverse total replacement of left shoulder Assessment & Plan: Surgery 09/01/2022. Doing well. PT 1-2 times per week. Follow up with surgeon as scheduled.    Essential hypertension Assessment & Plan: Chronic. Stable on Losartan 50 mg daily. Continue.   Orders: -     CBC with Differential/Platelet  Type 2 diabetes mellitus with hyperglycemia, without long-term current use of insulin (HCC) Assessment & Plan: Chronic. Stable on Mounjaro. She has been on 12.5 mg weekly x 4 months and would like to increase her dose. Will increase to 15 mg weekly. She will continue to monitor for adverse side effects. She will continue Jardiance daily. Last A1c- 7.8. Will check A1c today.   Orders: -     Hemoglobin A1c -     Tirzepatide; Inject 15 mg into the skin once  a week.  Dispense: 6 mL; Refill: 3  Hyperlipidemia LDL goal <70 Assessment & Plan: Chronic. Stable on Crestor 20 mg daily. Continue. Will check lipid panel.   Orders: -     Lipid panel  Hypothyroidism, unspecified  type Assessment & Plan: Chronic. Stable on Levothyroxine 137 mcg daily. Continue. Will check TSH.  Orders: -     TSH  Stage 3b chronic kidney disease (HCC) Assessment & Plan: Will check CMP today.   Orders: -     Comprehensive metabolic panel    Return in about 6 months (around 07/21/2023) for Follow up.   Dawn Dicker, NP-C [facility] - [employer]

## 2023-01-21 DIAGNOSIS — Z96612 Presence of left artificial shoulder joint: Secondary | ICD-10-CM | POA: Diagnosis not present

## 2023-01-21 LAB — LIPID PANEL
Cholesterol: 126 mg/dL (ref 0–200)
HDL: 75.3 mg/dL (ref >39.00)
LDL Cholesterol: 29 mg/dL (ref 0–99)
NonHDL: 50.71
Total CHOL/HDL Ratio: 2
Triglycerides: 109 mg/dL (ref 0.0–149.0)
VLDL: 21.8 mg/dL (ref 0.0–40.0)

## 2023-01-21 LAB — CBC WITH DIFFERENTIAL/PLATELET
Basophils Absolute: 0.1 10*3/uL (ref 0.0–0.1)
Basophils Relative: 1.1 % (ref 0.0–3.0)
Eosinophils Absolute: 0.2 10*3/uL (ref 0.0–0.7)
Eosinophils Relative: 2.9 % (ref 0.0–5.0)
HCT: 43.5 % (ref 36.0–46.0)
Hemoglobin: 13.7 g/dL (ref 12.0–15.0)
Lymphocytes Relative: 18 % (ref 12.0–46.0)
Lymphs Abs: 1.2 10*3/uL (ref 0.7–4.0)
MCHC: 31.6 g/dL (ref 30.0–36.0)
MCV: 90 fL (ref 78.0–100.0)
Monocytes Absolute: 0.6 10*3/uL (ref 0.1–1.0)
Monocytes Relative: 8.9 % (ref 3.0–12.0)
Neutro Abs: 4.4 10*3/uL (ref 1.4–7.7)
Neutrophils Relative %: 69.1 % (ref 43.0–77.0)
Platelets: 218 10*3/uL (ref 150.0–400.0)
RBC: 4.83 Mil/uL (ref 3.87–5.11)
RDW: 16 % — ABNORMAL HIGH (ref 11.5–15.5)
WBC: 6.4 10*3/uL (ref 4.0–10.5)

## 2023-01-21 LAB — COMPREHENSIVE METABOLIC PANEL
ALT: 16 U/L (ref 0–35)
AST: 20 U/L (ref 0–37)
Albumin: 4.1 g/dL (ref 3.5–5.2)
Alkaline Phosphatase: 88 U/L (ref 39–117)
BUN: 24 mg/dL — ABNORMAL HIGH (ref 6–23)
CO2: 27 meq/L (ref 19–32)
Calcium: 9.1 mg/dL (ref 8.4–10.5)
Chloride: 102 meq/L (ref 96–112)
Creatinine, Ser: 1.51 mg/dL — ABNORMAL HIGH (ref 0.40–1.20)
GFR: 34.12 mL/min — ABNORMAL LOW (ref >60.00)
Glucose, Bld: 101 mg/dL — ABNORMAL HIGH (ref 70–99)
Potassium: 4.2 meq/L (ref 3.5–5.1)
Sodium: 142 meq/L (ref 135–145)
Total Bilirubin: 0.6 mg/dL (ref 0.2–1.2)
Total Protein: 6.6 g/dL (ref 6.0–8.3)

## 2023-01-21 LAB — URIC ACID: Uric Acid, Serum: 7.6 mg/dL — ABNORMAL HIGH (ref 2.4–7.0)

## 2023-01-21 LAB — TSH: TSH: 4.63 u[IU]/mL (ref 0.35–5.50)

## 2023-01-21 LAB — HEMOGLOBIN A1C: Hgb A1c MFr Bld: 7.5 % — ABNORMAL HIGH (ref 4.6–6.5)

## 2023-01-25 ENCOUNTER — Ambulatory Visit: Payer: Medicare HMO | Admitting: Obstetrics and Gynecology

## 2023-01-25 ENCOUNTER — Telehealth: Payer: Self-pay

## 2023-01-25 ENCOUNTER — Other Ambulatory Visit: Payer: Self-pay | Admitting: Nurse Practitioner

## 2023-01-25 ENCOUNTER — Ambulatory Visit: Payer: Medicare HMO | Admitting: Physician Assistant

## 2023-01-25 DIAGNOSIS — M109 Gout, unspecified: Secondary | ICD-10-CM

## 2023-01-25 MED ORDER — COLCHICINE 0.6 MG PO TABS
0.6000 mg | ORAL_TABLET | Freq: Every day | ORAL | 0 refills | Status: DC
Start: 2023-01-25 — End: 2024-01-20

## 2023-01-25 NOTE — Telephone Encounter (Signed)
LMOM to CB in regards to labs. 

## 2023-01-26 ENCOUNTER — Encounter: Payer: Medicare HMO | Attending: Physician Assistant | Admitting: Physician Assistant

## 2023-01-26 DIAGNOSIS — I10 Essential (primary) hypertension: Secondary | ICD-10-CM | POA: Insufficient documentation

## 2023-01-26 DIAGNOSIS — Z86718 Personal history of other venous thrombosis and embolism: Secondary | ICD-10-CM | POA: Diagnosis not present

## 2023-01-26 DIAGNOSIS — S51811A Laceration without foreign body of right forearm, initial encounter: Secondary | ICD-10-CM | POA: Diagnosis not present

## 2023-01-26 DIAGNOSIS — L89623 Pressure ulcer of left heel, stage 3: Secondary | ICD-10-CM | POA: Diagnosis not present

## 2023-01-26 DIAGNOSIS — Z7901 Long term (current) use of anticoagulants: Secondary | ICD-10-CM | POA: Diagnosis not present

## 2023-01-26 DIAGNOSIS — L89616 Pressure-induced deep tissue damage of right heel: Secondary | ICD-10-CM | POA: Diagnosis not present

## 2023-01-26 DIAGNOSIS — E11621 Type 2 diabetes mellitus with foot ulcer: Secondary | ICD-10-CM | POA: Insufficient documentation

## 2023-01-26 DIAGNOSIS — I87323 Chronic venous hypertension (idiopathic) with inflammation of bilateral lower extremity: Secondary | ICD-10-CM | POA: Diagnosis not present

## 2023-01-26 DIAGNOSIS — I480 Paroxysmal atrial fibrillation: Secondary | ICD-10-CM | POA: Insufficient documentation

## 2023-01-26 NOTE — Progress Notes (Addendum)
[name], [name] ([id number]) [id number].pdf Page 1 of 11 Visit Report for 01/26/2023 Arrival Information Details Patient Name: Date of Service: [name] 01/26/2023 2:45 PM Medical Record Number: [id number] Patient Account Number: [id number] Date of Birth/Sex: Treating RN: 1950-08-04 (72 y.o. [name] Primary Care [name]: [name] Other Clinician: Betha [name] Referring [name]: Treating [name]/Extender: [name] in Treatment: 16 Visit Information History Since Last Visit All ordered tests and consults were completed: No Patient Arrived: Wheel Chair Added or deleted any medications: No Arrival Time: 15:14 Any new allergies or adverse reactions: No Transfer Assistance: EasyPivot Patient Lift Had a fall or experienced change in Yes Patient Identification Verified: Yes activities of daily living that may affect Secondary Verification Process Completed: Yes risk of falls: Patient Requires Transmission-Based Precautions: No Signs or symptoms of abuse/neglect since last visito No Patient Has Alerts: Yes Hospitalized since last visit: No Patient Alerts: Patient on Blood Thinner Implantable device outside of the clinic excluding No ABI L 1.1 02/05/22 cellular tissue based products placed in the center ABI R 1.12 02/05/22 since last visit: Has Dressing in Place as Prescribed: Yes Has Compression in Place as Prescribed: Yes Pain Present Now: Yes Electronic Signature(s) Signed: 01/27/2023 5:39:21 PM By: [name] Entered By: [name] on 01/26/2023 12:22:19 -------------------------------------------------------------------------------- Clinic Level of Care Assessment Details Patient Name: Date of Service: [name] 01/26/2023 2:45 PM Medical Record Number: [id number] Patient Account Number: [id number] Date of Birth/Sex: Treating RN: 11-Dec-1950 (72 y.o. [name] Primary Care [name]:  [name] Other Clinician: Betha [name] Referring [name]: Treating [name]/Extender: [name] in Treatment: 16 Clinic Level of Care Assessment Items TOOL 1 Quantity Score []  - 0 Use when EandM and Procedure is performed on INITIAL visit ASSESSMENTS - Nursing Assessment / Reassessment []  - 0 General Physical Exam (combine w/ comprehensive assessment (listed just below) when performed on new pt. [address][name], [name] [name] ([id number]) [id number].pdf Page 2 of 11 []  - 0 Comprehensive Assessment (HX, ROS, Risk Assessments, Wounds Hx, etc.) ASSESSMENTS - Wound and Skin Assessment / Reassessment []  - 0 Dermatologic / Skin Assessment (not related to wound area) ASSESSMENTS - Ostomy and/or Continence Assessment and Care []  - 0 Incontinence Assessment and Management []  - 0 Ostomy Care Assessment and Management (repouching, etc.) PROCESS - Coordination of Care []  - 0 Simple Patient / Family Education for ongoing care []  - 0 Complex (extensive) Patient / Family Education for ongoing care []  - 0 Staff obtains Chiropractor, Records, T Results / Process Orders est []  - 0 Staff telephones HHA, Nursing Homes / Clarify orders / etc []  - 0 Routine Transfer to another Facility (non-emergent condition) []  - 0 Routine Hospital Admission (non-emergent condition) []  - 0 New Admissions / Manufacturing engineer / Ordering NPWT Apligraf, etc. , []  - 0 Emergency Hospital Admission (emergent condition) PROCESS - Special Needs []  - 0 Pediatric / Minor Patient Management []  - 0 Isolation Patient Management []  - 0 Hearing / Language / Visual special needs []  - 0 Assessment of Community assistance (transportation, D/C planning, etc.) []  - 0 Additional assistance / Altered mentation []  - 0 Support Surface(s) Assessment (bed, cushion, seat, etc.) INTERVENTIONS - Miscellaneous []  - 0 External ear exam []  - 0 Patient Transfer (multiple staff /  Nurse, adult / Similar devices) []  - 0 Simple Staple / Suture removal (25 or less) []  - 0 Complex Staple / Suture removal (26 or more) []  - 0 Hypo/Hyperglycemic  Management (do not check if billed separately) []  - 0 Ankle / Brachial Index (ABI) - do not check if billed separately Has the patient been seen at the hospital within the last three years: Yes Total Score: 0 Level Of Care: ____ Electronic Signature(s) Signed: 01/27/2023 5:39:21 PM By: [name] Entered By: [name] on 01/26/2023 13:01:48 -------------------------------------------------------------------------------- Complex / Palliative Patient Assessment Details Patient Name: Date of Service: [name] 01/26/2023 2:45 PM Medical Record Number: [id number] Patient Account Number: [id number] Date of Birth/Sex: Treating RN: [birth year] (72 y.o. [name] Primary Care [name]: [name] Other Clinician: [name], [name] ([id number]) [id number] Page 3 of 11 Referring Aadin Gaut: Treating Kenshawn Maciolek/Extender: [name] in Treatment: 16 Complex Wound Management Criteria Patient has remarkable or complex co-morbidities requiring medications or treatments that extend wound healing times. Examples: Diabetes mellitus with chronic renal failure or end stage renal disease requiring dialysis Advanced or poorly controlled rheumatoid arthritis Diabetes mellitus and end stage chronic obstructive pulmonary disease Active cancer with current chemo- or radiation therapy DM, PVD, OBESITY/ IMMOMILITY, ARTHRITIS Palliative Wound Management Criteria Care Approach Wound Care Plan: Complex Wound Management Electronic Signature(s) Signed: 01/28/2023 12:48:26 PM By: Dawn Pax RN Signed: 01/28/2023 4:02:12 PM By: Dawn Derry PA-C Entered By: [name] on 01/28/2023  09:48:26 -------------------------------------------------------------------------------- Encounter Discharge Information Details Patient Name: Date of Service: [name], [name]. 01/26/2023 2:45 PM Medical Record Number: [id number] Patient Account Number: [id number] Date of Birth/Sex: Treating RN: 1950/08/13 (72 y.o. [name] Primary Care [name]: [name] Other Clinician: Betha [name] Referring [name]: Treating Niyla Marone/Extender: [name] in Treatment: 16 Encounter Discharge Information Items [name] Procedure Vitals Discharge Condition: Stable Temperature (F): 98.0 Ambulatory Status: Wheelchair Pulse (bpm): 83 Discharge Destination: Home Respiratory Rate (breaths/min): 18 Transportation: Private Auto Blood Pressure (mmHg): 113/60 Accompanied By: husband Schedule Follow-up Appointment: Yes Clinical Summary of Care: Electronic Signature(s) Signed: 01/27/2023 5:39:21 PM By: [name] Entered By: [name] on 01/26/2023 13:56:05 -------------------------------------------------------------------------------- Lower Extremity Assessment Details Patient Name: Date of Service: [name] 01/26/2023 2:45 PM ECKO, BANGS [name] ([id number]) [id number] Page 4 of 11 Medical Record Number: [id number] Patient Account Number: [id number] Date of Birth/Sex: Treating RN: 11-19-1950 (72 y.o. [name] Primary Care Mauri Tolen: [name] Other Clinician: Betha [name] Referring Kathryn Cosby: Treating Vineeth Fell/Extender: Algis Greenhouse Weeks in Treatment: 16 Edema Assessment Assessed: [Left: Yes] [Right: No] Edema: [Left: N] [Right: o] Calf Left: Right: Point of Measurement: 33 cm From Medial Instep 33.5 cm Ankle Left: Right: Point of Measurement: 7 cm From Medial Instep 17 cm Knee To Floor Left: Right: From Medial Instep 46 cm Electronic Signature(s) Signed: 01/27/2023 5:39:21 PM By: [name] Signed: 02/03/2023 4:43:41 PM By: Dawn Pax RN Entered By: [name] on 01/26/2023 12:44:44 -------------------------------------------------------------------------------- Multi Wound Chart Details Patient Name: Date of Service: [name], Dawn Cruz [name]. 01/26/2023 2:45 PM Medical Record Number: [id number] Patient Account Number: [id number] Date of Birth/Sex: Treating RN: 04/12/1950 (72 y.o. [name] Primary Care Trajon Rosete: [name] Other Clinician: Betha [name] Referring Austine Kelsay: Treating Mazey Mantell/Extender: Algis Greenhouse Weeks in Treatment: 16 Vital Signs Height(in): 64 Pulse(bpm): 83 Weight(lbs): 250 Blood Pressure(mmHg): 113/60 Body Mass Index(BMI): 42.9 Temperature(F): 98.0 Respiratory Rate(breaths/min): 18 [24:Photos:] [25:No Photos] [N/A:N/A] Left Calcaneus Right, Lateral Forearm N/A Wound Location: Gradually Appeared Trauma N/A Wounding Event: Pressure Ulcer Skin Tear N/A Primary Etiology: Asthma, Pneumothorax, Arrhythmia, Asthma, Pneumothorax, Arrhythmia, N/A Comorbid History: Estala, Gerlean [name] (  [id number]) [id number] Page 5 of 11 Hypertension, Type II Diabetes, Gout,Hypertension, Type II Diabetes, Gout, Osteoarthritis, Neuropathy Osteoarthritis, Neuropathy 08/22/2022 01/24/2023 N/A Date Acquired: 16 0 N/A Weeks of Treatment: Open Open N/A Wound Status: No No N/A Wound Recurrence: 0.5x0.7x0.1 4.5x5.5x0.1 N/A Measurements L x W x D (cm) 0.275 19.439 N/A A (cm) : rea 0.027 1.944 N/A Volume (cm) : 76.70% N/A N/A % Reduction in Area: 77.10% N/A N/A % Reduction in Volume: Category/Stage III Full Thickness Without Exposed N/A Classification: Support Structures Small Medium N/A Exudate A mount: Serous Serosanguineous N/A Exudate Type: amber red, brown N/A Exudate Color: Distinct, outline attached N/A N/A Wound Margin: Small (1-33%) None Present (0%) N/A Granulation Amount: Red N/A N/A Granulation  Quality: Medium (34-66%) Medium (34-66%) N/A Necrotic Amount: Adherent Slough, Necrosis of Bone Eschar, Adherent Slough N/A Necrotic Tissue: Fat Layer (Subcutaneous Tissue): Yes Fat Layer (Subcutaneous Tissue): Yes N/A Exposed Structures: Fascia: No Tendon: No Muscle: No Joint: No Bone: No None None N/A Epithelialization: Treatment Notes Electronic Signature(s) Signed: 01/27/2023 5:39:21 PM By: [name] Entered By: [name] on 01/26/2023 12:45:30 -------------------------------------------------------------------------------- Multi-Disciplinary Care Plan Details Patient Name: Date of Service: [name], [name]. 01/26/2023 2:45 PM Medical Record Number: [id number] Patient Account Number: [id number] Date of Birth/Sex: Treating RN: [birth year] (72 y.o. [name] Primary Care Kyri Shader: [name] Other Clinician: Betha [name] Referring Brelynn Wheller: Treating Abaigeal Moomaw/Extender: Algis Greenhouse Weeks in Treatment: 16 Active Inactive Necrotic Tissue Nursing Diagnoses: Knowledge deficit related to management of necrotic/devitalized tissue Goals: Necrotic/devitalized tissue will be minimized in the wound bed Date Initiated: 10/01/2022 Target Resolution Date: 11/01/2022 Goal Status: Active Interventions: Assess patient pain level pre-, during and [name] procedure and prior to discharge Treatment Activities: Apply topical anesthetic as ordered : 10/01/2022 Notes: KIELYN, DUDZIAK ([id number]) [id number] Page 6 of 11 Wound/Skin Impairment Nursing Diagnoses: Knowledge deficit related to ulceration/compromised skin integrity Goals: Patient/caregiver will verbalize understanding of skin care regimen Date Initiated: 10/01/2022 Target Resolution Date: 11/01/2022 Goal Status: Active Ulcer/skin breakdown will have a volume reduction of 30% by week 4 Date Initiated: 10/01/2022 Target Resolution Date: 11/01/2022 Goal Status:  Active Ulcer/skin breakdown will have a volume reduction of 50% by week 8 Date Initiated: 10/01/2022 Target Resolution Date: 12/02/2022 Goal Status: Active Ulcer/skin breakdown will have a volume reduction of 80% by week 12 Date Initiated: 10/01/2022 Target Resolution Date: 01/01/2023 Goal Status: Active Ulcer/skin breakdown will heal within 14 weeks Date Initiated: 10/01/2022 Target Resolution Date: 02/01/2023 Goal Status: Active Interventions: Assess patient/caregiver ability to obtain necessary supplies Assess patient/caregiver ability to perform ulcer/skin care regimen upon admission and as needed Assess ulceration(s) every visit Notes: Electronic Signature(s) Signed: 01/27/2023 5:39:21 PM By: [name] Signed: 02/03/2023 4:43:41 PM By: Dawn Pax RN Entered By: [name] on 01/26/2023 13:16:20 -------------------------------------------------------------------------------- Pain Assessment Details Patient Name: Date of Service: [name] 01/26/2023 2:45 PM Medical Record Number: [id number] Patient Account Number: [id number] Date of Birth/Sex: Treating RN: October 11, 1950 (72 y.o. [name] Primary Care Edwin Cherian: [name] Other Clinician: Betha [name] Referring Analise Glotfelty: Treating Dreonna Hussein/Extender: Algis Greenhouse Weeks in Treatment: 16 Active Problems Location of Pain Severity and Description of Pain Patient Has Paino Yes Site Locations Pain Location: [name], [name] ([id number]) [id number] Page 7 of 11 Pain Location: Generalized Pain Duration of the Pain. Constant / Intermittento Constant Rate the pain. Current Pain Level: 7 Character of Pain Describe the Pain: Aching, Throbbing Pain Management and Medication Current Pain Management: Medication: No Cold Application: Yes  Rest: No Massage: No Activity: No T.E.N.S.: No Heat Application: No Leg drop or elevation: No Is the Current Pain Management  Adequate: Inadequate How does your wound impact your activities of daily livingo Sleep: No Bathing: No Appetite: No Relationship With Others: No Bladder Continence: No Emotions: No Bowel Continence: No Work: No Toileting: No Drive: No Dressing: No Hobbies: No Electronic Signature(s) Signed: 01/27/2023 5:39:21 PM By: [name] Signed: 02/03/2023 4:43:41 PM By: Dawn Pax RN Entered By: [name] on 01/26/2023 12:28:12 -------------------------------------------------------------------------------- Patient/Caregiver Education Details Patient Name: Date of Service: [name] 11/5/2024andnbsp2:45 PM Medical Record Number: [id number] Patient Account Number: [id number] Date of Birth/Gender: Treating RN: 26-Oct-1950 (72 y.o. [name] Primary Care Physician: [name] Other Clinician: Betha [name] Referring Physician: Treating Physician/Extender: [name] in Treatment: 16 Education Assessment Education Provided To: Patient Education Topics Provided Wound/Skin Impairment: Handouts: Other: continue wound care as directed [name], [name] ([id number]) [id number] Page 8 of 11 Methods: Explain/Verbal Responses: State content correctly Electronic Signature(s) Signed: 01/27/2023 5:39:21 PM By: [name] Entered By: [name] on 01/26/2023 13:53:23 -------------------------------------------------------------------------------- Wound Assessment Details Patient Name: Date of Service: [name] 01/26/2023 2:45 PM Medical Record Number: [id number] Patient Account Number: [id number] Date of Birth/Sex: Treating RN: [birth year] (72 y.o. [name] Primary Care Tatanisha Cuthbert: [name] Other Clinician: Betha [name] Referring Udell Blasingame: Treating Olis Viverette/Extender: Algis Greenhouse Weeks in Treatment: 16 Wound Status Wound Number: 24 Primary Pressure Ulcer Etiology: Wound  Location: Left Calcaneus Wound Open Wounding Event: Gradually Appeared Status: Date Acquired: 08/22/2022 Comorbid Asthma, Pneumothorax, Arrhythmia, Hypertension, Type II Weeks Of Treatment: 16 History: Diabetes, Gout, Osteoarthritis, Neuropathy Clustered Wound: No Photos Wound Measurements Length: (cm) 0.5 Width: (cm) 0.7 Depth: (cm) 0.1 Area: (cm) 0.275 Volume: (cm) 0.027 % Reduction in Area: 76.7% % Reduction in Volume: 77.1% Epithelialization: None Wound Description Classification: Category/Stage III Wound Margin: Distinct, outline attached Exudate Amount: Small Exudate Type: Serous Exudate Color: amber Foul Odor After Cleansing: No Slough/Fibrino Yes Wound Bed Granulation Amount: Small (1-33%) Exposed Structure Granulation Quality: Red Fascia Exposed: No Necrotic Amount: Medium (34-66%) Fat Layer (Subcutaneous Tissue) Exposed: Yes Necrotic Quality: Adherent Slough, Bone Tendon Exposed: No Muscle Exposed: No [name], [name] ([id number]) [id number] Page 9 of 11 Joint Exposed: No Bone Exposed: No Treatment Notes Wound #24 (Calcaneus) Wound Laterality: Left Cleanser Normal Saline Discharge Instruction: Wash your hands with soap and water. Remove old dressing, discard into plastic bag and place into trash. Cleanse the wound with Normal Saline prior to applying a clean dressing using gauze sponges, not tissues or cotton balls. Do not scrub or use excessive force. Pat dry using gauze sponges, not tissue or cotton balls. Soap and Water Discharge Instruction: Gently cleanse wound with antibacterial soap, rinse and pat dry prior to dressing wounds Peri-Wound Care Topical Primary Dressing Promogran Matrix 4.34 (in) Discharge Instruction: Moisten w/normal saline or sterile water; Cover wound as directed. Do not remove from wound bed. Secondary Dressing Coverlet Latex-Free Fabric Adhesive Dressings Discharge Instruction: 1.5 x 2 Secured  With Compression Wrap Compression Stockings Add-Ons Electronic Signature(s) Signed: 01/27/2023 5:39:21 PM By: [name] Signed: 02/03/2023 4:43:41 PM By: Dawn Pax RN Entered By: [name] on 01/26/2023 12:45:19 -------------------------------------------------------------------------------- Wound Assessment Details Patient Name: Date of Service: [name], [name] 01/26/2023 2:45 PM Medical Record Number: [id number] Patient Account Number: [id number] Date of Birth/Sex: Treating RN: March 05, 1951 (72 y.o. [name] Primary Care Emersyn Wyss: [name] Other Clinician: Betha [name]  Referring Jameal Razzano: Treating Jusiah Aguayo/Extender: [name] in Treatment: 16 Wound Status Wound Number: 25 Primary Skin Tear Etiology: Wound Location: Right, Lateral Forearm Wound Open Wounding Event: Trauma Status: Date Acquired: 01/24/2023 Comorbid Asthma, Pneumothorax, Arrhythmia, Hypertension, Type II Weeks Of Treatment: 0 History: Diabetes, Gout, Osteoarthritis, Neuropathy Clustered Wound: No Wound Measurements Length: (cm) 4.5 Width: (cm) 5.5 Depth: (cm) 0.1 Area: (cm) 19.439 Buch, Teona [name] ([id number]) Volume: (cm) 1.944 % Reduction in Area: % Reduction in Volume: Epithelialization: None Tunneling: No [id number] Page 10 of 11 Undermining: No Wound Description Classification: Full Thickness Without Exposed Support Structures Exudate Amount: Medium Exudate Type: Serosanguineous Exudate Color: red, brown Foul Odor After Cleansing: No Slough/Fibrino Yes Wound Bed Granulation Amount: None Present (0%) Exposed Structure Necrotic Amount: Medium (34-66%) Fat Layer (Subcutaneous Tissue) Exposed: Yes Necrotic Quality: Eschar, Adherent Slough Treatment Notes Wound #25 (Forearm) Wound Laterality: Right, Lateral Cleanser Normal Saline Discharge Instruction: Wash your hands with soap and water. Remove old dressing, discard  into plastic bag and place into trash. Cleanse the wound with Normal Saline prior to applying a clean dressing using gauze sponges, not tissues or cotton balls. Do not scrub or use excessive force. Pat dry using gauze sponges, not tissue or cotton balls. Soap and Water Discharge Instruction: Gently cleanse wound with antibacterial soap, rinse and pat dry prior to dressing wounds Peri-Wound Care Topical Primary Dressing Xeroform 5x9-HBD (in/in) Discharge Instruction: Apply Xeroform 5x9-HBD (in/in) as directed Secondary Dressing ABD Pad 5x9 (in/in) Discharge Instruction: Cover with ABD pad Secured With Medipore T - 44M Medipore H Soft Cloth Surgical T ape ape, 2x2 (in/yd) Conform 4'' - Conforming Stretch Gauze Bandage 4x75 (in/in) Discharge Instruction: Apply as directed Compression Wrap Compression Stockings Add-Ons Electronic Signature(s) Signed: 01/27/2023 5:39:21 PM By: [name] Signed: 02/03/2023 4:43:41 PM By: Dawn Pax RN Entered By: [name] on 01/26/2023 12:43:33 -------------------------------------------------------------------------------- Vitals Details Patient Name: Date of Service: [name], Dawn Cruz [name]. 01/26/2023 2:45 PM Medical Record Number: [id number] Patient Account Number: [id number] Date of Birth/Sex: Treating RN: [birth year] (72 y.o. [name] Primary Care Lenka Zhao: [name] Other Clinician: Betha [name] Referring Lj Miyamoto: Treating Ronica Vivian/Extender: [name] in Treatment: [address], L[place] [name] ([id number]) [id number] Page 11 of 11 Vital Signs Time Taken: 15:37 Temperature (F): 98.0 Height (in): 64 Pulse (bpm): 83 Weight (lbs): 250 Respiratory Rate (breaths/min): 18 Body Mass Index (BMI): 42.9 Blood Pressure (mmHg): 113/60 Reference Range: 80 - 120 mg / dl Electronic Signature(s) Signed: 01/27/2023 5:39:21 PM By: [name] Entered By: [name] on 01/26/2023 12:28:05

## 2023-01-26 NOTE — Progress Notes (Addendum)
RUNELLE, CHOP [name] ([id number]) [id number] Page 1 of 11 Visit Report for 01/26/2023 Chief Complaint Document Details Patient Name: Date of Service: [name] 01/26/2023 2:45 PM Medical Record Number: [id number] Patient Account Number: [id number] Date of Birth/Sex: Treating [name]: 29-Jan-1951 (72 y.o. [name] Primary Care Provider: Bethanie [name] Other Clinician: Betha [name] Referring Provider: Treating Provider/Extender: [name] Weeks in Treatment: 16 Information Obtained from: Patient Chief Complaint Left heel ulcer and right forearm skin tear Electronic Signature(s) Signed: 01/26/2023 4:09:20 PM By: Dawn Derry PA-C Previous Signature: 01/26/2023 3:13:26 PM Version By: Dawn Derry PA-C Entered By: [name] on 01/26/2023 16:09:20 -------------------------------------------------------------------------------- Debridement Details Patient Name: Date of Service: [name], [name] 01/26/2023 2:45 PM Medical Record Number: [id number] Patient Account Number: [id number] Date of Birth/Sex: Treating [name]: [birth year] (72 y.o. [name] Primary Care Provider: Bethanie [name] Other Clinician: Betha [name] Referring Provider: Treating Provider/Extender: [name] Weeks in Treatment: 16 Debridement Performed for Assessment: Wound #25 Right,Lateral Forearm Performed By: Physician Dawn Derry, PA-C Debridement Type: Debridement Level of Consciousness (Pre-procedure): Awake and Alert Pre-procedure Verification/Time Out Yes - 15:54 Taken: Start Time: 15:54 Percent of Wound Bed Debrided: 25% T Area Debrided (cm): otal 4.86 Tissue and other material debrided: Viable, Non-Viable, Skin: Dermis , Skin: Epidermis Level: Skin/Epidermis Debridement Description: Selective/Open Wound Instrument: Curette Bleeding: Minimum Hemostasis Achieved: Pressure Response to Treatment: Procedure was tolerated well Level of  Consciousness ([name]- Awake and Alert procedure): [name], [name] ([id number]) [id number] Page 2 of 11 [name] Debridement Measurements of Total Wound Length: (cm) 4.5 Width: (cm) 5.5 Depth: (cm) 0.1 Volume: (cm) 1.944 Character of Wound/Ulcer [name] Debridement: Stable [name] Procedure Diagnosis Same as Pre-procedure Electronic Signature(s) Signed: 01/26/2023 5:08:46 PM By: Dawn Derry PA-C Signed: 01/27/2023 5:39:21 PM By: [name] Signed: 02/03/2023 4:43:41 PM By: [name] Entered By: [name] on 01/26/2023 16:01:16 -------------------------------------------------------------------------------- HPI Details Patient Name: Date of Service: [name], [name]. 01/26/2023 2:45 PM Medical Record Number: [id number] Patient Account Number: [id number] Date of Birth/Sex: Treating [name]: [birth year] (72 y.o. [name] Primary Care Provider: Bethanie [name] Other Clinician: Betha [name] Referring Provider: Treating Provider/Extender: [name] Weeks in Treatment: 16 History of Present Illness HPI Description: 06/28/20 upon evaluation today patient appears to be doing unfortunately somewhat poorly in regard to wounds that she has over her lower extremities. She has a wound on the left distal second toe, left posterior heel, and right anterior lower leg. With that being said all in all none of the wounds appear to be too bad the one that hurts her the most is actually the wound on the posterior heel which actually is also one of the smallest. Nonetheless I think there is some callus hiding what is really going on in this area. Fortunately there does not appear to be any obvious evidence of infection which is great news. Patient does have a history of diabetes mellitus type 2, chronic venous insufficiency, hypertension, history of DVT and is on long-term anticoagulant s, therapy, Eliquis. 07/05/20 upon evaluation today patient appears to be  doing well currently with regard to her wounds. I feel like she is making progress which is great news and overall there is no signs of active infection at this time. No fevers, chills, nausea, vomiting, or diarrhea. 4/29; patient has a only a small area remaining on the tip of her left second toe and a small area remaining on the left heel. The area on  the right anterior lower leg is healed. They tell me that she had remote podiatric surgery on the toes of the left foot however they are very fixed in position and I wonder whether they are making it difficult to relieve pressure in her foot wear. She is a type II diabetic with neuropathy as well. 08/01/2020 upon evaluation today patient appears to be doing well currently in regard to her wounds. In fact everything appears to be doing much better. The posterior aspect of her heel is actually giving her some trouble not the Achilles area but a small wound that we did not even have marked. In fact I had noted this before. Nonetheless we are going to addressed that today. 5/27; most of the patient's wounds are healed including the left Achilles heel left toe. Right forearm is also healed. She has a new abrasion posteriorly in the right elbow area just above the olecranon this happened today with an abrasion from her car door 6/15; the patient's wounds on the left Achilles and left second toe remain healed. The area on the right forearm is also just about healed in fact the skin I replaced last time is looking viable which is good. She had me look at the left heel again which is not in the area of the wound more towards the tip of the heel at the Achilles insertion site as well as the tip of the left foot second toe. In the Achilles area that is clearly is friction probably in bed at night. Not really near where the wound was. The left second toe remains healed although this is hammered and overhanging first toe also puts pressure in this area 09/13/2020 upon  evaluation today patient appears to be doing well with regard to all previous wounds that she had. Everything is completely healed. With that being said I do think that the patient unfortunately has a new skin tear on the right forearm which is due to her put a Band-Aid on it and then when it pulled off this caused some issues with skin tearing. Nonetheless I think that this needs to be addressed today. Hopefully will take too long to get this to heal. She is having some issues with pain in her heel but I think this is more related to be honest to her neuropathy as opposed to anything else. Readmission: 10/30/2020 upon evaluation today patient appears to be doing poorly in regard to her legs bilaterally. She is also having an issue with her fourth toe on the left. Fortunately there does not appear to be any signs of active infection at this time. No fevers, chills, nausea, vomiting, or diarrhea. It is actually been quite a while since we have seen her. In fact June 24 was the last time that she was here in the clinic. She tells me she did not come back because things were just doing "pretty well". 11/29/2020 upon evaluation today patient actually appears to be doing well with regard to her arms those are completely healed. Her right leg is still open but doing okay. The fourth toe of the left foot does show some signs here of having some issues with necrotic tissue of an open wound. This is where a toenail was removed by podiatry. Subsequently I do think that we need to go ahead and see what we can do about getting this cleared away so being try to get some healing going forward. TERIN, KAHRS [name] ([id number]) [id number] Page 3 of 11 12/13/2020 upon  evaluation today patient appears to be doing decently well in regard to her wound on the right leg. In general I think that she is making good progress here. Unfortunately she continues to have significant issues with swelling in the  left leg is now even more swollen at this point. For that reason I do believe she would be benefited by wrapping both sides although she is not interested in doing this. Overall I think that she is definitely making some good progress here nonetheless in regard to the right leg left leg leave some to be desired. 12/27/2020 upon evaluation today patient appears to be doing well with regard to her wound in regard to the legs. Unfortunately the toe was not doing nearly as well. I am much more concerned about infection and osteomyelitis here. She had the MRI but right now were not seeing obvious evidence of redness spreading up to her foot this is good news I do not have the result of the MRI as of yet. I am good to place her on doxycycline however due to the fact that the wound does seem to be getting a little worse in regard to her toe. 01/14/2021 upon evaluation today patient appears to be doing well with regard to her ulcers. The right leg is doing well and even the toe seems to be doing well though she still has some evidence of bone exposed on the toe which does not appear to be doing quite as good as I would like to see just and the fact that is still not covered over new tissue but looks tremendously better compared to 2 weeks ago. Overall very pleased in that regard. 01/28/2021 upon evaluation today patient's leg on the left actually appears to have new wounds this is due to her deciding to shave yesterday. Fortunately there does not appear to be any signs of infection currently. This is good news. Nonetheless I do believe that with these new areas open it would be best to wrap her leg to try to make sure we get this under control sooner rather than later. With regard to her toe there still does appear to be some evidence of bone exposed this was covered over with callus I did remove that today. Nonetheless I think this is significantly smaller than previous. 02/11/2021 upon evaluation today patient  appears to be doing okay in regard to her leg ulcers. I do not see anything obvious at this point this seems to be a complicating factor. I think that she is getting better. Were wrap of the right leg not the left leg. With that being said the left fourth toe ulcer still does have evidence of being open there is some drainage still on top of the wound I think we can to try to see about using something like a collagen dressing today to see if that would be of benefit for her. She is in agreement with giving this a trial. Nonetheless I am concerned about the fact that she continues to have issues here with this not healing it is confirmed chronic osteomyelitis she has been on antibiotics. I am going to extend those today as well. That is Keflex and that will be for 1 additional month. Nonetheless if she is not doing significantly better by the next time I see her I think that the biggest thing is going to be for [place] to go ahead and see what we can do about getting her started with hyperbaric oxygen therapy to try  to save the toe. 02/20/2021 upon evaluation today patient presents for follow-up she actually came in a little bit early as the home health nurse was concerned about her toe becoming "gangrenous". With that being said the good news is there is [place] no evidence of this and in fact the toe seems to be doing better at this point. What was over the toe was an area that had bled significantly and then subsequently dried making it look much worse than where things actually stand. There is just a very small opening remaining in the toe. In regard to her legs everything appears to be healed bilaterally. 02/24/2021 upon evaluation today patient appears to be doing well with regard to her toe ulcer. I am actually very pleased with where we stand and I think that she is making good progress at this time. There is still a very small opening although I think the biggest issue is the collagen seems to be getting  stuck and drying up because the wound is so small we probably just need to use something to keep this moist like a little piece of Xeroform gauze and see if we get this to close. 03/07/2021 upon evaluation patient actually appears to be making excellent progress in regard to her last wound on the toe. There is just a very tiny potential area that I can even be sure at first until I get some of the dry skin off when I did get this removed there was just a very tiny pinpoint opening that was barely even moist. Actually feel like this is very close to complete resolution. She has been using the Xeroform that is doing great. 03/25/2021 upon evaluation today patient appears to be doing well with regard to her toe ulcer. In fact this appears to be potentially completely healed. Fortunately I do not see anything draining at this point which is great news. She does have a small blood blister that is already drying up and looking okay on the third toe as well I am not sure what happened here she has an either. Otherwise she is having some weeping from the right leg this again is newer she tells me from last week weighing in that her pulmonologist and then today weighing in at the hematologist that she gained 30 pounds. This is quite significant if indeed true and I think she is to contact her primary care provider ASAP. This would account for why her right leg is weeping as well. 04/04/2020 upon evaluation today patient appears to be doing excellent in regard to her toe which I actually think is healed today. With that being said she unfortunately is having issues with her leg swelling and again several blisters on each leg that have reopened unfortunately. 05/02/2021 upon evaluation today patient appears to be doing well with regard to her wounds. In fact is down just 1 on the anterior portion of her right leg which is actually from a blister. Otherwise she is doing awesome. 05/15/2021 upon evaluation today  patient appears to be doing better in regard to her leg ulcer although she has a small irritated area from tape on the leg medially. She also has a skin tear to her right elbow region. Fortunately there does not appear to be any signs of active infection locally nor systemically at this point which is great news. No fevers, chills, nausea, vomiting, or diarrhea. 05/26/2021 upon evaluation today patient appears to be doing better in regard to the actual wounds although she  is having a lot of leaking from the right leg. She is also been using her lymphedema pumps along with wearing her compression. Nonetheless she is having a lot of trouble with her breathing I am concerned that the pumps may be contributing to this. 06/12/2021 upon evaluation today patient appears to be doing better in regard to a lot of things including her breathing as well as her legs. She still has a lot of swelling but the good news is this is better she lost about 20 pounds of fluid when she went to the hospital and they diuresed her. Subsequently this is good news. Nonetheless she actually told me that "if she ever gets that bad again for me to yell at her to go to the hospital". She said that I was not quite forceful enough and telling her what she needed to do. She also notes she will try to be more receptive in the future. 06/19/2021 upon evaluation today patient appears to be doing well with regard to her wound. She is tolerating the dressing changes without complication. Fortunately her legs on the right are completely healed. She did have an area that she hit her Achilles on the left but this too seems to be pretty much closed at this point. Readmission: 02-05-2022 upon evaluation today patient appears to be doing well currently in regard to her left leg although her right leg is actually giving her quite a bit more trouble at this point. Fortunately there does not appear to be any signs of infection locally or systemically  which is great news. No fevers, chills, nausea, vomiting, or diarrhea. Patient tells me that she began having issues more recently with her legs in the right leg is definitely worse than the left as far as the way she feels as well and she states it was around September 16 that she first noted this and then when she called it took about 2-1/2 weeks to get in to see me. Her past medical history really has not changed since I saw her back in March everything is pretty much about the same repeat ABIs appear to be doing well currently at 1.1 on the left and 1.12 on the right. 02-10-2022 upon evaluation today patient actually appears to be doing well. Culture actually showed evidence of Pseudomonas but her legs look amazing today I do not see any signs of infection I do not believe that we need to add anything especially in light of the fact there is a lot of interactions with anything to treat the Pseudomonas in her current medication regimen. For that reason we will just hold with where we stand and continue with the current therapy. 02-24-2022 upon evaluation today patient appears to be doing excellent in fact she appears to be completely healed based on what we are seeing. Readmission: 10-01-2022 upon evaluation today patient presents for follow-up evaluation here in the clinic she has not been seen since December 2023. At that point she was actually being seen for her legs that is actually doing much better which is great news. Overall very pleased with her leg situation and she has been doing great [name], [name] ([id number]) [id number] Page 4 of 11 with her compression socks. With that being said unfortunately she does seem to be having issues with her heel this was secondary to having her left shoulder replacement and subsequent to that she was in the hospital for 6 days during that time she developed a pressure wound on the left  heel. Unfortunately that is now it is  giving her the most trouble she also has some irritation of the right heel but is not nearly as significant as far as that is concerned there is no signs of active infection at this time which is great news and very pleased in that regard. 10-09-2022 upon evaluation today patient appears to be doing well currently in regard to her heel we are definitely seeing signs of improvement which is great news and in general I think that we are moving in the right direction. I do not see any evidence of worsening overall which is great news. 7/25; pressure ulcer at the tip of her heel she has been using Promogran and sit to foot with a border foam. She is a type II diabetic. She is attempting to religiously offload this area with Prevalon boots at night open heeled shoes during the day. Her measurements are a lot better today per our intake nurse 11-03-2022 upon evaluation today patient actually has not been there for 3 weeks is a combination of a couple missed appointments on her end and last week she had to cancel because of weather. Either way her foot seems to be doing well in regard to the heel on the left lateral area. This is not showing signs of any obvious infection and at this point I think that she is making headway towards getting this closed although I do believe there is quite a bit of slough and biofilm skin need to be cleaned off just a little bit of callus around the edges of the wound. 11-10-2022 upon evaluation today patient appears to be doing well currently in regard to her heel ulcer. She has been tolerating the dressing changes without complication and in general I feel like she is making excellent headway towards complete closure. 8/27; patient has a wound on the left heel near the tip. Using Promogran and Zituvimet. Comes in with open heeled shoes however it has a lip that encompasses the wound area which is on the distal part of the Achilles area. 12-08-2022 upon evaluation today patient  appears to be doing well currently in regard to her wound on the foot which is actually doing excellent. This heel is measuring about 60% smaller than last time I saw her. Fortunately I think that she is really doing quite well at this time. 01-11-2023 upon evaluation today patient appears to be doing well currently in regard to her wound. She has been tolerating the dressing changes without complication. With that being said her heel actually seems to be doing well. She is inquiring as to whether or not she can do her 2-week follow-up. 01-26-2023 upon evaluation today patient appears to be doing well currently in regard to her heel ulcer I am pleased in that regard. Unfortunately she has a skin tear of the right lateral forearm which occurred as a result of a fall Sunday night. She tells me that she has been having issues here since that time with bruising and discomfort she is on Eliquis so the bruising is not unexpected. With that being said I do believe that she is going to require something to keep this from sticking to the wound and allowing it to continue to heal I think that skin to be appropriate I discussed with her options today as well. Electronic Signature(s) Signed: 01/26/2023 4:09:29 PM By: Dawn Derry PA-C Entered By: [name] on 01/26/2023 16:09:28 -------------------------------------------------------------------------------- Physical Exam Details Patient Name: Date of Service: Dawn BERTS,  NA NCY [name]. 01/26/2023 2:45 PM Medical Record Number: [id number] Patient Account Number: [id number] Date of Birth/Sex: Treating [name]: 1950/10/23 (72 y.o. [name] Primary Care Provider: Bethanie [name] Other Clinician: Betha [name] Referring Provider: Treating Provider/Extender: [name] Weeks in Treatment: 16 Constitutional Well-nourished and well-hydrated in no acute distress. Respiratory normal breathing without difficulty. Psychiatric this patient is able to make  decisions and demonstrates good insight into disease process. Alert and Oriented x 3. pleasant and cooperative. Notes Upon inspection patient's wound bed actually showed signs of good granulation on the heel no sharp debridement necessary I was able to clean this away just with a Q-tip and saline moistened gauze there was no sharp debridement necessary. In regard to her forearm I did actually perform debridement clearway necrotic debris. This was some dead skin that was actually hanging on the edge I wanted to clear this away to allow it to heal more effectively. Electronic Signature(s) Signed: 01/26/2023 4:10:37 PM By: Dawn Derry PA-C Entered By: [name] on 01/26/2023 16:10:37 [name] ([id number]) [id number] Page 5 of 11 -------------------------------------------------------------------------------- Physician Orders Details Patient Name: Date of Service: [name] 01/26/2023 2:45 PM Medical Record Number: [id number] Patient Account Number: [id number] Date of Birth/Sex: Treating [name]: [birth year] (72 y.o. [name] Primary Care Provider: Bethanie [name] Other Clinician: Betha [name] Referring Provider: Treating Provider/Extender: Amedeo Kinsman in Treatment: 16 The following information was scribed by: [name] The information was scribed for: [name] Verbal / Phone Orders: No Diagnosis Coding ICD-10 Coding Code Description E11.621 Type 2 diabetes mellitus with foot ulcer I87.323 Chronic venous hypertension (idiopathic) with inflammation of bilateral lower extremity L89.623 Pressure ulcer of left heel, stage 3 L89.616 Pressure-induced deep tissue damage of right heel Z79.01 Long term (current) use of anticoagulants I48.0 Paroxysmal atrial fibrillation Follow-up Appointments Return Appointment in 1 week. Home Health [facility] for wound care. May utilize formulary equivalent dressing for wound  treatment orders unless otherwise specified. Home Health Nurse may visit PRN to address patients wound care needs. Rolene Arbour for wound care to left calcan [phone] Anesthetic (Use 'Patient Medications' Section for Anesthetic Order Entry) Lidocaine applied to wound bed Wound Treatment Wound #24 - Calcaneus Wound Laterality: Left Cleanser: Normal Saline Every Other Day/30 Days Discharge Instructions: Wash your hands with soap and water. Remove old dressing, discard into plastic bag and place into trash. Cleanse the wound with Normal Saline prior to applying a clean dressing using gauze sponges, not tissues or cotton balls. Do not scrub or use excessive force. Pat dry using gauze sponges, not tissue or cotton balls. Cleanser: Soap and Water Every Other Day/30 Days Discharge Instructions: Gently cleanse wound with antibacterial soap, rinse and pat dry prior to dressing wounds Prim Dressing: Promogran Matrix 4.34 (in) (Dispense As Written) Every Other Day/30 Days ary Discharge Instructions: Moisten w/normal saline or sterile water; Cover wound as directed. Do not remove from wound bed. Secondary Dressing: Coverlet Latex-Free Fabric Adhesive Dressings Every Other Day/30 Days Discharge Instructions: 1.5 x 2 Wound #25 - Forearm Wound Laterality: Right, Lateral Cleanser: Normal Saline Every Other Day/30 Days Discharge Instructions: Wash your hands with soap and water. Remove old dressing, discard into plastic bag and place into trash. Cleanse the wound with Normal Saline prior to applying a clean dressing using gauze sponges, not tissues or cotton balls. Do not scrub or use excessive force. Pat dry using gauze sponges, not tissue or cotton balls. Cleanser: Soap and Water  Every Other Day/30 Days Discharge Instructions: Gently cleanse wound with antibacterial soap, rinse and pat dry prior to dressing wounds Prim Dressing: Xeroform 5x9-HBD (in/in) Every Other Day/30 Days ary Discharge  Instructions: Apply Xeroform 5x9-HBD (in/in) as directed Secondary Dressing: ABD Pad 5x9 (in/in) Every Other Day/30 Days Discharge Instructions: Cover with ABD pad [name], [name] ([id number]) [id number] Page 6 of 11 Secured With: Medipore T - 68M Medipore H Soft Cloth Surgical T ape ape, 2x2 (in/yd) Every Other Day/30 Days Secured With: Conform 4'' - Conforming Stretch Gauze Bandage 4x75 (in/in) Every Other Day/30 Days Discharge Instructions: Apply as directed Electronic Signature(s) Signed: 01/26/2023 5:08:46 PM By: Dawn Derry PA-C Signed: 01/27/2023 5:39:21 PM By: [name] Entered By: [name] on 01/26/2023 16:01:42 -------------------------------------------------------------------------------- Problem List Details Patient Name: Date of Service: [name], [name] 01/26/2023 2:45 PM Medical Record Number: [id number] Patient Account Number: [id number] Date of Birth/Sex: Treating [name]: 10/17/1950 (72 y.o. [name] Primary Care Provider: Bethanie [name] Other Clinician: Betha [name] Referring Provider: Treating Provider/Extender: [name] Weeks in Treatment: 16 Active Problems ICD-10 Encounter Code Description Active Date MDM Diagnosis E11.621 Type 2 diabetes mellitus with foot ulcer 10/01/2022 No Yes I87.323 Chronic venous hypertension (idiopathic) with inflammation of bilateral lower 10/01/2022 No Yes extremity L89.623 Pressure ulcer of left heel, stage 3 10/01/2022 No Yes S51.811A Laceration without foreign body of right forearm, initial encounter 01/26/2023 No Yes L89.616 Pressure-induced deep tissue damage of right heel 10/01/2022 No Yes Z79.01 Long term (current) use of anticoagulants 10/01/2022 No Yes I48.0 Paroxysmal atrial fibrillation 10/01/2022 No Yes Inactive Problems Resolved Problems [name], [name] ([id number]) [phone].pdf Page 7 of 11 Electronic Signature(s) Signed: 01/26/2023  4:09:02 PM By: Dawn Derry PA-C Previous Signature: 01/26/2023 3:13:23 PM Version By: Dawn Derry PA-C Entered By: [name] on 01/26/2023 16:09:01 -------------------------------------------------------------------------------- Progress Note Details Patient Name: Date of Service: Dawn [place], [place] 01/26/2023 2:45 PM Medical Record Number: [id number] Patient Account Number: [id number] Date of Birth/Sex: Treating [name]: 1950/09/11 (72 y.o. [name] Primary Care Provider: Bethanie [name] Other Clinician: Betha [name] Referring Provider: Treating Provider/Extender: [name] Weeks in Treatment: 16 Subjective Chief Complaint Information obtained from Patient Left heel ulcer and right forearm skin tear History of Present Illness (HPI) 06/28/20 upon evaluation today patient appears to be doing unfortunately somewhat poorly in regard to wounds that she has over her lower extremities. She has a wound on the left distal second toe, left posterior heel, and right anterior lower leg. With that being said all in all none of the wounds appear to be too bad the one that hurts her the most is actually the wound on the posterior heel which actually is also one of the smallest. Nonetheless I think there is some callus hiding what is really going on in this area. Fortunately there does not appear to be any obvious evidence of infection which is great news. Patient does have a history of diabetes mellitus type 2, chronic venous insufficiency, hypertension, history of DVT and is on long-term anticoagulant therapy, Eliquis. s, 07/05/20 upon evaluation today patient appears to be doing well currently with regard to her wounds. I feel like she is making progress which is great news and overall there is no signs of active infection at this time. No fevers, chills, nausea, vomiting, or diarrhea. 4/29; patient has a only a small area remaining on the tip of her left second toe and a small area  remaining on the left heel.  The area on the right anterior lower leg is healed. They tell me that she had remote podiatric surgery on the toes of the left foot however they are very fixed in position and I wonder whether they are making it difficult to relieve pressure in her foot wear. She is a type II diabetic with neuropathy as well. 08/01/2020 upon evaluation today patient appears to be doing well currently in regard to her wounds. In fact everything appears to be doing much better. The posterior aspect of her heel is actually giving her some trouble not the Achilles area but a small wound that we did not even have marked. In fact I had noted this before. Nonetheless we are going to addressed that today. 5/27; most of the patient's wounds are healed including the left Achilles heel left toe. Right forearm is also healed. She has a new abrasion posteriorly in the right elbow area just above the olecranon this happened today with an abrasion from her car door 6/15; the patient's wounds on the left Achilles and left second toe remain healed. The area on the right forearm is also just about healed in fact the skin I replaced last time is looking viable which is good. She had me look at the left heel again which is not in the area of the wound more towards the tip of the heel at the Achilles insertion site as well as the tip of the left foot second toe. In the Achilles area that is clearly is friction probably in bed at night. Not really near where the wound was. The left second toe remains healed although this is hammered and overhanging first toe also puts pressure in this area 09/13/2020 upon evaluation today patient appears to be doing well with regard to all previous wounds that she had. Everything is completely healed. With that being said I do think that the patient unfortunately has a new skin tear on the right forearm which is due to her put a Band-Aid on it and then when it pulled off this  caused some issues with skin tearing. Nonetheless I think that this needs to be addressed today. Hopefully will take too long to get this to heal. She is having some issues with pain in her heel but I think this is more related to be honest to her neuropathy as opposed to anything else. Readmission: 10/30/2020 upon evaluation today patient appears to be doing poorly in regard to her legs bilaterally. She is also having an issue with her fourth toe on the left. Fortunately there does not appear to be any signs of active infection at this time. No fevers, chills, nausea, vomiting, or diarrhea. It is actually been quite a while since we have seen her. In fact June 24 was the last time that she was here in the clinic. She tells me she did not come back because things were just doing "pretty well". 11/29/2020 upon evaluation today patient actually appears to be doing well with regard to her arms those are completely healed. Her right leg is still open but doing okay. The fourth toe of the left foot does show some signs here of having some issues with necrotic tissue of an open wound. This is where a toenail was removed by podiatry. Subsequently I do think that we need to go ahead and see what we can do about getting this cleared away so being try to get some healing going forward. 12/13/2020 upon evaluation today patient appears to be  doing decently well in regard to her wound on the right leg. In general I think that she is making good progress here. Unfortunately she continues to have significant issues with swelling in the left leg is now even more swollen at this point. For that reason I do believe she would be benefited by wrapping both sides although she is not interested in doing this. Overall I think that she is definitely making some good progress here nonetheless in regard to the right leg left leg leave some to be desired. 12/27/2020 upon evaluation today patient appears to be doing well with regard  to her wound in regard to the legs. Unfortunately the toe was not doing nearly as well. I am much more concerned about infection and osteomyelitis here. She had the MRI but right now were not seeing obvious evidence of redness spreading up to her foot this is good news I do not have the result of the MRI as of yet. I am good to place her on doxycycline however due to the fact that the wound does seem to be getting a little worse in regard to her toe. [name], [name] ([id number]) [id number] Page 8 of 11 01/14/2021 upon evaluation today patient appears to be doing well with regard to her ulcers. The right leg is doing well and even the toe seems to be doing well though she still has some evidence of bone exposed on the toe which does not appear to be doing quite as good as I would like to see just and the fact that is still not covered over new tissue but looks tremendously better compared to 2 weeks ago. Overall very pleased in that regard. 01/28/2021 upon evaluation today patient's leg on the left actually appears to have new wounds this is due to her deciding to shave yesterday. Fortunately there does not appear to be any signs of infection currently. This is good news. Nonetheless I do believe that with these new areas open it would be best to wrap her leg to try to make sure we get this under control sooner rather than later. With regard to her toe there still does appear to be some evidence of bone exposed this was covered over with callus I did remove that today. Nonetheless I think this is significantly smaller than previous. 02/11/2021 upon evaluation today patient appears to be doing okay in regard to her leg ulcers. I do not see anything obvious at this point this seems to be a complicating factor. I think that she is getting better. Were wrap of the right leg not the left leg. With that being said the left fourth toe ulcer still does have evidence of being open  there is some drainage still on top of the wound I think we can to try to see about using something like a collagen dressing today to see if that would be of benefit for her. She is in agreement with giving this a trial. Nonetheless I am concerned about the fact that she continues to have issues here with this not healing it is confirmed chronic osteomyelitis she has been on antibiotics. I am going to extend those today as well. That is Keflex and that will be for 1 additional month. Nonetheless if she is not doing significantly better by the next time I see her I think that the biggest thing is going to be for [place] to go ahead and see what we can do about getting her started with hyperbaric oxygen  therapy to try to save the toe. 02/20/2021 upon evaluation today patient presents for follow-up she actually came in a little bit early as the home health nurse was concerned about her toe becoming "gangrenous". With that being said the good news is there is [place] no evidence of this and in fact the toe seems to be doing better at this point. What was over the toe was an area that had bled significantly and then subsequently dried making it look much worse than where things actually stand. There is just a very small opening remaining in the toe. In regard to her legs everything appears to be healed bilaterally. 02/24/2021 upon evaluation today patient appears to be doing well with regard to her toe ulcer. I am actually very pleased with where we stand and I think that she is making good progress at this time. There is still a very small opening although I think the biggest issue is the collagen seems to be getting stuck and drying up because the wound is so small we probably just need to use something to keep this moist like a little piece of Xeroform gauze and see if we get this to close. 03/07/2021 upon evaluation patient actually appears to be making excellent progress in regard to her last wound on the toe.  There is just a very tiny potential area that I can even be sure at first until I get some of the dry skin off when I did get this removed there was just a very tiny pinpoint opening that was barely even moist. Actually feel like this is very close to complete resolution. She has been using the Xeroform that is doing great. 03/25/2021 upon evaluation today patient appears to be doing well with regard to her toe ulcer. In fact this appears to be potentially completely healed. Fortunately I do not see anything draining at this point which is great news. She does have a small blood blister that is already drying up and looking okay on the third toe as well I am not sure what happened here she has an either. Otherwise she is having some weeping from the right leg this again is newer she tells me from last week weighing in that her pulmonologist and then today weighing in at the hematologist that she gained 30 pounds. This is quite significant if indeed true and I think she is to contact her primary care provider ASAP. This would account for why her right leg is weeping as well. 04/04/2020 upon evaluation today patient appears to be doing excellent in regard to her toe which I actually think is healed today. With that being said she unfortunately is having issues with her leg swelling and again several blisters on each leg that have reopened unfortunately. 05/02/2021 upon evaluation today patient appears to be doing well with regard to her wounds. In fact is down just 1 on the anterior portion of her right leg which is actually from a blister. Otherwise she is doing awesome. 05/15/2021 upon evaluation today patient appears to be doing better in regard to her leg ulcer although she has a small irritated area from tape on the leg medially. She also has a skin tear to her right elbow region. Fortunately there does not appear to be any signs of active infection locally nor systemically at this point which is great  news. No fevers, chills, nausea, vomiting, or diarrhea. 05/26/2021 upon evaluation today patient appears to be doing better in regard to the actual  wounds although she is having a lot of leaking from the right leg. She is also been using her lymphedema pumps along with wearing her compression. Nonetheless she is having a lot of trouble with her breathing I am concerned that the pumps may be contributing to this. 06/12/2021 upon evaluation today patient appears to be doing better in regard to a lot of things including her breathing as well as her legs. She still has a lot of swelling but the good news is this is better she lost about 20 pounds of fluid when she went to the hospital and they diuresed her. Subsequently this is good news. Nonetheless she actually told me that "if she ever gets that bad again for me to yell at her to go to the hospital". She said that I was not quite forceful enough and telling her what she needed to do. She also notes she will try to be more receptive in the future. 06/19/2021 upon evaluation today patient appears to be doing well with regard to her wound. She is tolerating the dressing changes without complication. Fortunately her legs on the right are completely healed. She did have an area that she hit her Achilles on the left but this too seems to be pretty much closed at this point. Readmission: 02-05-2022 upon evaluation today patient appears to be doing well currently in regard to her left leg although her right leg is actually giving her quite a bit more trouble at this point. Fortunately there does not appear to be any signs of infection locally or systemically which is great news. No fevers, chills, nausea, vomiting, or diarrhea. Patient tells me that she began having issues more recently with her legs in the right leg is definitely worse than the left as far as the way she feels as well and she states it was around September 16 that she first noted this and then  when she called it took about 2-1/2 weeks to get in to see me. Her past medical history really has not changed since I saw her back in March everything is pretty much about the same repeat ABIs appear to be doing well currently at 1.1 on the left and 1.12 on the right. 02-10-2022 upon evaluation today patient actually appears to be doing well. Culture actually showed evidence of Pseudomonas but her legs look amazing today I do not see any signs of infection I do not believe that we need to add anything especially in light of the fact there is a lot of interactions with anything to treat the Pseudomonas in her current medication regimen. For that reason we will just hold with where we stand and continue with the current therapy. 02-24-2022 upon evaluation today patient appears to be doing excellent in fact she appears to be completely healed based on what we are seeing. Readmission: 10-01-2022 upon evaluation today patient presents for follow-up evaluation here in the clinic she has not been seen since December 2023. At that point she was actually being seen for her legs that is actually doing much better which is great news. Overall very pleased with her leg situation and she has been doing great with her compression socks. With that being said unfortunately she does seem to be having issues with her heel this was secondary to having her left shoulder replacement and subsequent to that she was in the hospital for 6 days during that time she developed a pressure wound on the left heel. Unfortunately that is now it  is giving her the most trouble she also has some irritation of the right heel but is not nearly as significant as far as that is concerned there is no signs of active infection at this time which is great news and very pleased in that regard. 10-09-2022 upon evaluation today patient appears to be doing well currently in regard to her heel we are definitely seeing signs of improvement which is  great news and in general I think that we are moving in the right direction. I do not see any evidence of worsening overall which is great news. 7/25; pressure ulcer at the tip of her heel she has been using Promogran and sit to foot with a border foam. She is a type II diabetic. She is attempting to religiously offload this area with Prevalon boots at night open heeled shoes during the day. Her measurements are a lot better today per our intake nurse [name], [name] ([id number]) [id number] Page 9 of 11 11-03-2022 upon evaluation today patient actually has not been there for 3 weeks is a combination of a couple missed appointments on her end and last week she had to cancel because of weather. Either way her foot seems to be doing well in regard to the heel on the left lateral area. This is not showing signs of any obvious infection and at this point I think that she is making headway towards getting this closed although I do believe there is quite a bit of slough and biofilm skin need to be cleaned off just a little bit of callus around the edges of the wound. 11-10-2022 upon evaluation today patient appears to be doing well currently in regard to her heel ulcer. She has been tolerating the dressing changes without complication and in general I feel like she is making excellent headway towards complete closure. 8/27; patient has a wound on the left heel near the tip. Using Promogran and Zituvimet. Comes in with open heeled shoes however it has a lip that encompasses the wound area which is on the distal part of the Achilles area. 12-08-2022 upon evaluation today patient appears to be doing well currently in regard to her wound on the foot which is actually doing excellent. This heel is measuring about 60% smaller than last time I saw her. Fortunately I think that she is really doing quite well at this time. 01-11-2023 upon evaluation today patient appears to be doing well  currently in regard to her wound. She has been tolerating the dressing changes without complication. With that being said her heel actually seems to be doing well. She is inquiring as to whether or not she can do her 2-week follow-up. 01-26-2023 upon evaluation today patient appears to be doing well currently in regard to her heel ulcer I am pleased in that regard. Unfortunately she has a skin tear of the right lateral forearm which occurred as a result of a fall Sunday night. She tells me that she has been having issues here since that time with bruising and discomfort she is on Eliquis so the bruising is not unexpected. With that being said I do believe that she is going to require something to keep this from sticking to the wound and allowing it to continue to heal I think that skin to be appropriate I discussed with her options today as well. Objective Constitutional Well-nourished and well-hydrated in no acute distress. Vitals Time Taken: 3:37 PM, Height: 64 in, Weight: 250 lbs, BMI: 42.9, Temperature: 98.0 F,  Pulse: 83 bpm, Respiratory Rate: 18 breaths/min, Blood Pressure: 113/60 mmHg. Respiratory normal breathing without difficulty. Psychiatric this patient is able to make decisions and demonstrates good insight into disease process. Alert and Oriented x 3. pleasant and cooperative. General Notes: Upon inspection patient's wound bed actually showed signs of good granulation on the heel no sharp debridement necessary I was able to clean this away just with a Q-tip and saline moistened gauze there was no sharp debridement necessary. In regard to her forearm I did actually perform debridement clearway necrotic debris. This was some dead skin that was actually hanging on the edge I wanted to clear this away to allow it to heal more effectively. Integumentary (Hair, Skin) Wound #24 status is Open. Original cause of wound was Gradually Appeared. The date acquired was: 08/22/2022. The wound has been  in treatment 16 weeks. The wound is located on the Left Calcaneus. The wound measures 0.5cm length x 0.7cm width x 0.1cm depth; 0.275cm^2 area and 0.027cm^3 volume. There is Fat Layer (Subcutaneous Tissue) exposed. There is a small amount of serous drainage noted. The wound margin is distinct with the outline attached to the wound base. There is small (1-33%) red granulation within the wound bed. There is a medium (34-66%) amount of necrotic tissue within the wound bed including Adherent Slough and Necrosis of Bone. Wound #25 status is Open. Original cause of wound was Trauma. The date acquired was: 01/24/2023. The wound is located on the Right,Lateral Forearm. The wound measures 4.5cm length x 5.5cm width x 0.1cm depth; 19.439cm^2 area and 1.944cm^3 volume. There is Fat Layer (Subcutaneous Tissue) exposed. There is no tunneling or undermining noted. There is a medium amount of serosanguineous drainage noted. There is no granulation within the wound bed. There is a medium (34-66%) amount of necrotic tissue within the wound bed including Eschar and Adherent Slough. Assessment Active Problems ICD-10 Type 2 diabetes mellitus with foot ulcer Chronic venous hypertension (idiopathic) with inflammation of bilateral lower extremity Pressure ulcer of left heel, stage 3 Laceration without foreign body of right forearm, initial encounter Pressure-induced deep tissue damage of right heel Long term (current) use of anticoagulants Paroxysmal atrial fibrillation Procedures Wound #25 Pre-procedure diagnosis of Wound #25 is a Skin T located on the Right,Lateral Forearm . There was a Selective/Open Wound Skin/Epidermis Debridement ear with a total area of 4.86 sq cm performed by Dawn Derry, PA-C. With the following instrument(s): Curette to remove Viable and Non-Viable tissue/material. [name], WRZESINSKI [name] ([id number]) [id number] Page 10 of 11 Material removed includes Skin: Dermis and  Skin: Epidermis and. A time out was conducted at 15:54, prior to the start of the procedure. A Minimum amount of bleeding was controlled with Pressure. The procedure was tolerated well. [name] Debridement Measurements: 4.5cm length x 5.5cm width x 0.1cm depth; 1.944cm^3 volume. Character of Wound/Ulcer [name] Debridement is stable. [name] procedure Diagnosis Wound #25: Same as Pre-Procedure Plan Follow-up Appointments: Return Appointment in 1 week. Home Health: [facility] for wound care. May utilize formulary equivalent dressing for wound treatment orders unless otherwise specified. Home Health Nurse may visit PRN to address patients wound care needs. Rolene Arbour for wound care to left calcan [phone] Anesthetic (Use 'Patient Medications' Section for Anesthetic Order Entry): Lidocaine applied to wound bed WOUND #24: - Calcaneus Wound Laterality: Left Cleanser: Normal Saline Every Other Day/30 Days Discharge Instructions: Wash your hands with soap and water. Remove old dressing, discard into plastic bag and place into trash. Cleanse the wound with Normal Saline  prior to applying a clean dressing using gauze sponges, not tissues or cotton balls. Do not scrub or use excessive force. Pat dry using gauze sponges, not tissue or cotton balls. Cleanser: Soap and Water Every Other Day/30 Days Discharge Instructions: Gently cleanse wound with antibacterial soap, rinse and pat dry prior to dressing wounds Prim Dressing: Promogran Matrix 4.34 (in) (Dispense As Written) Every Other Day/30 Days ary Discharge Instructions: Moisten w/normal saline or sterile water; Cover wound as directed. Do not remove from wound bed. Secondary Dressing: Coverlet Latex-Free Fabric Adhesive Dressings Every Other Day/30 Days Discharge Instructions: 1.5 x 2 WOUND #25: - Forearm Wound Laterality: Right, Lateral Cleanser: Normal Saline Every Other Day/30 Days Discharge Instructions: Wash your hands with soap and  water. Remove old dressing, discard into plastic bag and place into trash. Cleanse the wound with Normal Saline prior to applying a clean dressing using gauze sponges, not tissues or cotton balls. Do not scrub or use excessive force. Pat dry using gauze sponges, not tissue or cotton balls. Cleanser: Soap and Water Every Other Day/30 Days Discharge Instructions: Gently cleanse wound with antibacterial soap, rinse and pat dry prior to dressing wounds Prim Dressing: Xeroform 5x9-HBD (in/in) Every Other Day/30 Days ary Discharge Instructions: Apply Xeroform 5x9-HBD (in/in) as directed Secondary Dressing: ABD Pad 5x9 (in/in) Every Other Day/30 Days Discharge Instructions: Cover with ABD pad Secured With: Medipore T - 66M Medipore H Soft Cloth Surgical T ape ape, 2x2 (in/yd) Every Other Day/30 Days Secured With: Conform 4'' - Conforming Stretch Gauze Bandage 4x75 (in/in) Every Other Day/30 Days Discharge Instructions: Apply as directed 1. I would recommend based on what we are seeing that we have the patient going continue to monitor for any signs of infection or worsening. I do believe that the patient is making good headway here towards closure. 2. I am good recommend as well that the patient should continue with the Xeroform gauze to the forearm and to the heel we are gena continue at this point with the collagen which I think is doing a good job. We will see patient back for reevaluation in 1 week here in the clinic. If anything worsens or changes patient will contact our office for additional recommendations. I did recommend that she should continue to wear the compression socks on her legs which seem to be doing well. Also recommended she should go to get her elbow checked I am concerned about the fact that she has some much bruising it may just be due to Eliquis but I think she needs to make sure nothing is fractured. Electronic Signature(s) Signed: 01/26/2023 4:11:31 PM By: Dawn Derry  PA-C Entered By: [name] on 01/26/2023 16:11:31 -------------------------------------------------------------------------------- SuperBill Details Patient Name: Date of Service: [name], [place] 01/26/2023 Medical Record Number: [id number] Patient Account Number: [id number] Date of Birth/Sex: Treating [name]: April 18, 1950 (72 y.o. [name] Primary Care Provider: Bethanie [name] Other Clinician: Ambur, Honorato ([id number]) [id number] Page 11 of 11 Referring Provider: Treating Provider/Extender: Amedeo Kinsman in Treatment: 16 Diagnosis Coding ICD-10 Codes Code Description E11.621 Type 2 diabetes mellitus with foot ulcer I87.323 Chronic venous hypertension (idiopathic) with inflammation of bilateral lower extremity L89.623 Pressure ulcer of left heel, stage 3 S51.811A Laceration without foreign body of right forearm, initial encounter L89.616 Pressure-induced deep tissue damage of right heel Z79.01 Long term (current) use of anticoagulants I48.0 Paroxysmal atrial fibrillation Facility Procedures : CPT4 Code: [id number] Description: 97597 - DEBRIDE WOUND 1ST 20 SQ CM  OR < ICD-10 Diagnosis Description S51.811A Laceration without foreign body of right forearm, initial encounter Modifier: Quantity: 1 Physician Procedures : CPT4 Code Description Modifier [id number] 99213 - WC PHYS LEVEL 3 - EST PT 25 ICD-10 Diagnosis Description E11.621 Type 2 diabetes mellitus with foot ulcer I87.323 Chronic venous hypertension (idiopathic) with inflammation of bilateral lower extremity  L89.623 Pressure ulcer of left heel, stage 3 S51.811A Laceration without foreign body of right forearm, initial encounter Quantity: 1 : [id number] 97597 - WC PHYS DEBR WO ANESTH 20 SQ CM ICD-10 Diagnosis Description S51.811A Laceration without foreign body of right forearm, initial encounter Quantity: 1 Electronic Signature(s) Signed: 01/26/2023 4:11:54 PM By:  Dawn Derry PA-C Entered By: [name] on 01/26/2023 16:11:54

## 2023-01-27 ENCOUNTER — Encounter: Payer: Self-pay | Admitting: Nurse Practitioner

## 2023-01-27 NOTE — Assessment & Plan Note (Signed)
Chronic. Worsening neuropathy x 3-4 weeks, mostly at night. She has been walking and become more mobile recently. Will increase Lyrica to 75 mg TID. PDMP reviewed. She will contact if her symptoms persist despite increasing her Lyrica.

## 2023-01-27 NOTE — Assessment & Plan Note (Signed)
Surgery 09/01/2022. Doing well. PT 1-2 times per week. Follow up with surgeon as scheduled.

## 2023-01-27 NOTE — Assessment & Plan Note (Signed)
Chronic. Stable on Losartan 50 mg daily. Continue.

## 2023-01-27 NOTE — Assessment & Plan Note (Signed)
Chronic. Stable on Crestor 20 mg daily. Continue. Will check lipid panel.

## 2023-01-27 NOTE — Assessment & Plan Note (Signed)
Chronic. Stable on Levothyroxine 137 mcg daily. Continue. Will check TSH.

## 2023-01-27 NOTE — Assessment & Plan Note (Addendum)
Pain with bending only. No erythema, warmth or swelling present. Pain and ROM improved with colchicine, however suspect osteoarthritis. Will check uric acid level to rule out gout.

## 2023-01-27 NOTE — Assessment & Plan Note (Signed)
Chronic. Stable on Mounjaro. She has been on 12.5 mg weekly x 4 months and would like to increase her dose. Will increase to 15 mg weekly. She will continue to monitor for adverse side effects. She will continue Jardiance daily. Last A1c- 7.8. Will check A1c today.

## 2023-01-27 NOTE — Assessment & Plan Note (Signed)
Will check CMP today

## 2023-01-28 ENCOUNTER — Telehealth: Payer: Self-pay | Admitting: Nurse Practitioner

## 2023-01-28 MED ORDER — LEVOTHYROXINE SODIUM 137 MCG PO TABS: 137.0000 ug | ORAL_TABLET | Freq: Every day | ORAL | 3 refills | Status: DC

## 2023-01-28 NOTE — Telephone Encounter (Signed)
Copied from CRM [id number]. Topic: Medicare AWV >> Jan 28, 2023 11:31 AM Payton Doughty wrote: Reason for CRM: Called 01/28/2023 to sched Annual Wellness Visit - NO VOICEMAIL  Verlee Rossetti; Care Guide Ambulatory Clinical Support  l [facility] Direct Dial: [phone]

## 2023-01-30 DIAGNOSIS — R258 Other abnormal involuntary movements: Secondary | ICD-10-CM | POA: Diagnosis not present

## 2023-02-02 ENCOUNTER — Encounter: Payer: Self-pay | Admitting: Cardiovascular Disease

## 2023-02-02 ENCOUNTER — Ambulatory Visit: Payer: Medicare HMO | Attending: Cardiovascular Disease | Admitting: Cardiovascular Disease

## 2023-02-02 VITALS — BP 104/70 | HR 67 | Ht 65.0 in | Wt 245.0 lb

## 2023-02-02 DIAGNOSIS — N183 Chronic kidney disease, stage 3 unspecified: Secondary | ICD-10-CM

## 2023-02-02 DIAGNOSIS — I4821 Permanent atrial fibrillation: Secondary | ICD-10-CM | POA: Diagnosis not present

## 2023-02-02 DIAGNOSIS — I10 Essential (primary) hypertension: Secondary | ICD-10-CM | POA: Diagnosis not present

## 2023-02-02 DIAGNOSIS — I5032 Chronic diastolic (congestive) heart failure: Secondary | ICD-10-CM

## 2023-02-02 MED ORDER — LOSARTAN POTASSIUM 25 MG PO TABS: 25.0000 mg | ORAL_TABLET | Freq: Every day | ORAL | 3 refills | Status: AC

## 2023-02-02 NOTE — Patient Instructions (Signed)
Medication Instructions:  DECREASE the Losartan to 25 mg once daily  *If you need a refill on your cardiac medications before your next appointment, please call your pharmacy*   Lab Work: None ordered If you have labs (blood work) drawn today and your tests are completely normal, you will receive your results only by: MyChart Message (if you have MyChart) OR A paper copy in the mail If you have any lab test that is abnormal or we need to change your treatment, we will call you to review the results.   Testing/Procedures: None ordered   Follow-Up: At [facility], you and your health needs are our priority.  As part of our continuing mission to provide you with exceptional heart care, we have created designated Provider Care Teams.  These Care Teams include your primary Cardiologist (physician) and Advanced Practice Providers (APPs -  Physician Assistants and Nurse Practitioners) who all work together to provide you with the care you need, when you need it.  We recommend signing up for the patient portal called "MyChart".  Sign up information is provided on this After Visit Summary.  MyChart is used to connect with patients for Virtual Visits (Telemedicine).  Patients are able to view lab/test results, encounter notes, upcoming appointments, etc.  Non-urgent messages can be sent to your provider as well.   To learn more about what you can do with MyChart, go to [email or web address].    Your next appointment:   6 month(s)  Provider:   You may see Lorine Bears, MD or one of the following Advanced Practice Providers on your designated Care Team:   Nicolasa Ducking, NP Eula Listen, PA-C Cadence Fransico Michael, PA-C Charlsie Quest, NP Carlos Levering, NP

## 2023-02-02 NOTE — Progress Notes (Signed)
Cardiology Office Note   Date:  02/02/2023   ID:  [name], DOB 02-24-1951, MRN [id number]  PCP:  [name], NP  Cardiologist:   Lorine Bears, MD   Chief Complaint  Patient presents with   Follow-up    Patient denies new or acute cardiac problems/concerns today.  Had a fall 9 days ago and hurt right arm with significant bruising today.  Did not go to ER but was evaluated by [facility] walk in clinic with a normal xray.  Patient states that the right arm is still sore today.      History of Present Illness: [name] is a 72 y.o. adult who presents for a follow-up visit regarding atrial fibrillation and chronic diastolic heart failure. She has chronic medical conditions that include diabetes mellitus, hypertension, hyperlipidemia, obesity, sleep apnea on CPAP and previous DVT. She also has sleep apnea and has been on CPAP for many years.  She has chronic left foot ulceration.  There was no evidence of peripheral arterial disease on noninvasive Doppler as well as an angiogram done in 2020. She had a nuclear stress test done in January 2020 which showed no evidence of ischemia. She had recurrent small strokes on anticoagulation in 2021 in 2022.  She was switched from Pradaxa to Eliquis. She was hospitalized in March 2023 with lower extremity cellulitis and acute on chronic diastolic heart failure. She underwent left shoulder surgery in June 2024.  She attended patient rehab for 10 days.  She reports significant improvement in left shoulder pain.  Unfortunately, she fell recently which caused right arm hematoma.  She had x-rays done with no evidence of fractures.  She did not have syncope. Otherwise, she has been doing reasonably well with no palpitations, chest pain or worsening dyspnea.  She takes furosemide 20 mg daily and an extra dose if there is increased volume overload.   Past Medical History:  Diagnosis Date   Anemia    Anxiety    a.) on BZO (alprazolam) PRN    Arthritis    Asthma    Atypical chest pain    a. 03/2018 MV: No ischemia/infarct. EF >65%.   Carotid arterial disease (HCC)    a. 03/2020 U/S: RICA <50, LICA nl.   Chronic heart failure with preserved ejection fraction (HFpEF) (HCC)    a. 08/2013 Echo: EF 60-65%; b. 08/2019 Echo: EF 60-65%; c. 03/2020 Echo: EF 60-65%; d. 05/2021 Echo: EF 55-60%, no rmwa, nl RV fxn, RVSP 54.32mmHg. Mildly dil LA, mild MR, mod TR.   Chronic, continuous use of opioids    a.) has naloxone Rx available   CKD (chronic kidney disease), stage III (HCC)    Complication of anesthesia    a.) RIGHT hemidiaphragm paralysis s/p interscalene block for shoulder arthroplasty in 2017 --> caused "permanent lung damage" per patient --> REFUSES further neuraxial anesthesia   Cystocele    Deep vein thrombosis (DVT) of right lower extremity (HCC)    Depression    Diaphragm paralysis 2018   a.) RIGHT hemidiaphragm paralysis s/p interscalene block for shoulder arthroplasty in 2017; refuses additional regional/neuraxial anesthesia   DM (diabetes mellitus) type II, controlled, with peripheral vascular disorder (HCC) 08/31/2014   Dyspnea    11/08/2018 - "more now due to lack of exercise"   Edema    Fatty liver    GERD (gastroesophageal reflux disease)    Gout    Heart murmur    Hemiparesis affecting left side as late effect of  cerebrovascular accident [facility])    Hiatal hernia    a.) s/p repair   History of IBS    History of methicillin resistant staphylococcus aureus (MRSA) 2008   History of multiple strokes    Hyperlipidemia    Hypertension    Hypothyroidism    Insomnia    Long term current use of anticoagulant    a.) apixaban   Mitral regurgitation    a. 05/2021 Echo: Mild MR.   Nephrolithiasis    Neuropathy involving both lower extremities    Non-healing ulcer of left foot (HCC)    Obesity, Class II, BMI 35-39.9, with comorbidity    OSA on CPAP    PAF (paroxysmal atrial fibrillation) (HCC)    a.) 07/2013 Event monitor: Freq  PVCs and 3 short runs of Afib; b.) CHA2DS2VASc = 8 (age, sex, CHF, HTN, CVA/DVT x2, vascular disease history, T2DM);  b.) rate/rhythm maintained without pharmacological intervention; chronically anticoagulated with apixaban   PAH (pulmonary artery hypertension) (HCC)    a. 05/2021 Echo: RVSP 54.69mmHg.   Peripheral vascular disease (HCC)    a. 03/2018 LE Angio: Nl renal arteries. Nl aorta & iliacs. Nl LLE arterial flow; b. 06/2020 ABI's: Nl bilat.   T2DM (type 2 diabetes mellitus) (HCC)    Tricuspid regurgitation    a. 05/2021 Echo: Mod TR (in setting of RVSP 54.49mmHg).   Umbilical hernia    a.) s/p repair   Urinary incontinence    Venous stasis dermatitis of both lower extremities     Past Surgical History:  Procedure Laterality Date   ANKLE SURGERY Right    APPLICATION OF WOUND VAC Left 06/27/2015   Procedure: APPLICATION OF WOUND VAC ( POSSIBLE ) ;  Surgeon: Annice Needy, MD;  Location: [facility] ORS;  Service: Vascular;  Laterality: Left;   APPLICATION OF WOUND VAC Left 09/25/2016   Procedure: APPLICATION OF WOUND VAC;  Surgeon: Recardo Evangelist, DPM;  Location: [facility] ORS;  Service: Podiatry;  Laterality: Left;   arm surgery     right     CHOLECYSTECTOMY     COLONOSCOPY     COLONOSCOPY WITH PROPOFOL N/A 01/11/2017   Procedure: COLONOSCOPY WITH PROPOFOL;  Surgeon: Christena Deem, MD;  Location: [facility] ENDOSCOPY;  Service: Endoscopy;  Laterality: N/A;   COLONOSCOPY WITH PROPOFOL N/A 02/22/2017   Procedure: COLONOSCOPY WITH PROPOFOL;  Surgeon: Christena Deem, MD;  Location: [facility] ENDOSCOPY;  Service: Endoscopy;  Laterality: N/A;   COLONOSCOPY WITH PROPOFOL N/A 05/31/2017   Procedure: COLONOSCOPY WITH PROPOFOL;  Surgeon: Christena Deem, MD;  Location: [facility] ENDOSCOPY;  Service: Endoscopy;  Laterality: N/A;   COLONOSCOPY WITH PROPOFOL N/A 01/26/2022   Procedure: COLONOSCOPY WITH PROPOFOL;  Surgeon: Regis Bill, MD;  Location: [facility] ENDOSCOPY;  Service: Endoscopy;  Laterality: N/A;    ESOPHAGOGASTRODUODENOSCOPY (EGD) WITH PROPOFOL N/A 01/11/2017   Procedure: ESOPHAGOGASTRODUODENOSCOPY (EGD) WITH PROPOFOL;  Surgeon: Christena Deem, MD;  Location: [facility] ENDOSCOPY;  Service: Endoscopy;  Laterality: N/A;   ESOPHAGOGASTRODUODENOSCOPY (EGD) WITH PROPOFOL N/A 10/25/2018   Procedure: ESOPHAGOGASTRODUODENOSCOPY (EGD) WITH PROPOFOL;  Surgeon: Christena Deem, MD;  Location: [facility] ENDOSCOPY;  Service: Endoscopy;  Laterality: N/A;   ESOPHAGOGASTRODUODENOSCOPY (EGD) WITH PROPOFOL N/A 11/29/2019   Procedure: ESOPHAGOGASTRODUODENOSCOPY (EGD) WITH PROPOFOL;  Surgeon: Toledo, Boykin Nearing, MD;  Location: [facility] ENDOSCOPY;  Service: Gastroenterology;  Laterality: N/A;   ESOPHAGOGASTRODUODENOSCOPY (EGD) WITH PROPOFOL N/A 01/26/2022   Procedure: ESOPHAGOGASTRODUODENOSCOPY (EGD) WITH PROPOFOL;  Surgeon: Regis Bill, MD;  Location: [facility] ENDOSCOPY;  Service: Endoscopy;  Laterality:  N/A;  DM   HIATAL HERNIA REPAIR     I & D EXTREMITY Left 06/27/2015   Procedure: IRRIGATION AND DEBRIDEMENT EXTREMITY            ( CALF HEMATOMA ) POSSIBLE WOUND VAC;  Surgeon: Annice Needy, MD;  Location: [facility] ORS;  Service: Vascular;  Laterality: Left;   INCISION AND DRAINAGE OF WOUND Left 09/25/2016   Procedure: IRRIGATION AND DEBRIDEMENT - PARTIAL RESECTION OF ACHILLES TENDON WITH WOUND VAC APPLICATION;  Surgeon: Recardo Evangelist, DPM;  Location: [facility] ORS;  Service: Podiatry;  Laterality: Left;   INCISION AND DRAINAGE OF WOUND Left 11/09/2018   Procedure: Excision of left foot wound with ACell placement;  Surgeon: Peggye Form, DO;  Location: [facility] OR;  Service: Plastics;  Laterality: Left;   IRRIGATION AND DEBRIDEMENT ABSCESS Left 09/07/2016   Procedure: IRRIGATION AND DEBRIDEMENT ABSCESS LEFT HEEL;  Surgeon: Recardo Evangelist, DPM;  Location: [facility] ORS;  Service: Podiatry;  Laterality: Left;   KIDNEY STONE SURGERY Right    LITHOTRIPSY     LOWER EXTREMITY ANGIOGRAPHY Left 04/04/2018   Procedure: LOWER  EXTREMITY ANGIOGRAPHY;  Surgeon: Annice Needy, MD;  Location: [facility] INVASIVE CV LAB;  Service: Cardiovascular;  Laterality: Left;   NM GATED MYOVIEW ([facility] HX)  04/2015   Likely breast attenuation. LOW RISK study. Normal EF 55-60%.   OTHER SURGICAL HISTORY     08/2016 or 09/2016 surgery on achilles tenso h/o staph infection heal. Dr. Orland Jarred   removal of left hematoma Left    leg   REVERSE SHOULDER ARTHROPLASTY Right 10/08/2015   Procedure: REVERSE SHOULDER ARTHROPLASTY;  Surgeon: Christena Flake, MD;  Location: [facility] ORS;  Service: Orthopedics;  Laterality: Right;   REVERSE SHOULDER ARTHROPLASTY Left 09/01/2022   Procedure: REVERSE SHOULDER ARTHROPLASTY;  Surgeon: Signa Kell, MD;  Location: [facility] ORS;  Service: Orthopedics;  Laterality: Left;   SLEEVE GASTROPLASTY     TONSILLECTOMY     TOTAL KNEE ARTHROPLASTY Left    TOTAL KNEE ARTHROPLASTY  2013   TOTAL SHOULDER REPLACEMENT Right    TRANSESOPHAGEAL ECHOCARDIOGRAM  08/08/2013   Mild LVH, EF 60-65%. Moderate LA dilation and mild RA dilation. Mild MR with no evidence of stenosis and no evidence of endocarditis. A false chordae is noted.   TRANSTHORACIC ECHOCARDIOGRAM  08/03/2013   Mild-Moderate concentric LVH, EF 60-65%. Normal diastolic function. Mild LA dilation. Mild MR with possible vegetation  - not confirmed on TEE    ULNAR NERVE TRANSPOSITION Right 08/06/2016   Procedure: ULNAR NERVE DECOMPRESSION/TRANSPOSITION;  Surgeon: Christena Flake, MD;  Location: [facility] ORS;  Service: Orthopedics;  Laterality: Right;   UMBILICAL HERNIA REPAIR     UPPER GI ENDOSCOPY     VAGINAL HYSTERECTOMY       Current Outpatient Medications  Medication Sig Dispense Refill   acetaminophen (TYLENOL) 500 MG tablet Take 2 tablets (1,000 mg total) by mouth every 8 (eight) hours. 30 tablet 0   albuterol (VENTOLIN HFA) 108 (90 Base) MCG/ACT inhaler Inhale 2 puffs into the lungs every 6 (six) hours as needed. 8 g 4   buPROPion (WELLBUTRIN XL) 150 MG 24 hr tablet Take 1  tablet (150 mg total) by mouth daily. 90 tablet 3   calcium-vitamin D (OSCAL WITH D) 500-200 MG-UNIT tablet Take 1 tablet by mouth 2 (two) times daily with a meal. 60 tablet 0   cephALEXin (KEFLEX) 250 MG capsule Take 1 capsule (250 mg total) by mouth daily. 90 capsule 1   clobetasol cream (TEMOVATE)  0.05 % Apply 1 application topically 2 (two) times daily as needed (skin irritation).      colchicine 0.6 MG tablet Take 1 tablet (0.6 mg total) by mouth daily. Repeat I 1 hour if needed. No more than 3 tablets in 24 hours PRN for GOUT FLARE 30 tablet 0   Continuous Glucose Sensor (FREESTYLE LIBRE 3 SENSOR) MISC      cyanocobalamin (VITAMIN B12) 1000 MCG tablet Take 1,000 mcg by mouth daily.     diclofenac Sodium (VOLTAREN) 1 % GEL Apply 2 g topically 3 (three) times daily. 100 g 2   DULoxetine (CYMBALTA) 60 MG capsule Take 1 capsule (60 mg total) by mouth daily. (Patient taking differently: Take 60 mg by mouth daily. 6 pm) 90 capsule 3   ELIQUIS 5 MG TABS tablet TAKE 1 TABLET BY MOUTH TWICE DAILY 180 tablet 1   empagliflozin (JARDIANCE) 25 MG TABS tablet Take 1 tablet (25 mg total) by mouth daily before breakfast. 90 tablet 3   ergocalciferol (VITAMIN D2) 1.25 MG (50000 UT) capsule Take 50,000 Units by mouth once a week.     estradiol (ESTRACE) 0.1 MG/GM vaginal cream Apply 0.5mg  (pea-sized amount)  just inside the vaginal introitus with a finger-tip on Monday, Wednesday and Friday nights, 42.5 g 11   ferrous sulfate 324 MG TBEC Take 324 mg by mouth daily with breakfast.     fluconazole (DIFLUCAN) 150 MG tablet Take 1 tablet by mouth x 1 dose. May repeat in 72 hours if needed. 3 tablet 1   Fluticasone-Umeclidin-Vilant (TRELEGY ELLIPTA) 200-62.5-25 MCG/ACT AEPB INHALE ONE PUFF INTO THE LUNGS EVERY DAY 60 each 5   folic acid (FOLVITE) 800 MCG tablet Take 800 mcg by mouth daily.     furosemide (LASIX) 40 MG tablet Take 1 tablet (40 mg total) by mouth as directed. Take 1 tablet daily. May take additional 1  tablet If weight gain of greater than 2 pounds in 24 hours or 5 pounds in one week. 90 tablet 3   glucose blood (ONETOUCH ULTRA) test strip TEST TWICE DAILY AS DIRECTED 100 each 11   ipratropium-albuterol (DUONEB) 0.5-2.5 (3) MG/3ML SOLN Take 3 mLs by nebulization every 6 (six) hours as needed.     Lancets (ONETOUCH ULTRASOFT) lancets Use as instructed 100 each 12   levocetirizine (XYZAL) 5 MG tablet TAKE ONE TABLET BY MOUTH AT BEDTIME AS NEEDED FOR ALLERGIES 90 tablet 3   levothyroxine (SYNTHROID) 137 MCG tablet Take 1 tablet (137 mcg total) by mouth daily before breakfast. 90 tablet 3   Loperamide-Simethicone 2-125 MG TABS Take 1 tablet by mouth daily as needed. May repeat in 1 hour     losartan (COZAAR) 50 MG tablet TAKE 1 TABLET BY MOUTH DAILY 90 tablet 3   Magnesium ([employer] TRIPLE MAGNESIUM COMPLEX) 400 MG CAPS Take 1 capsule by mouth daily.     Misc. Devices (TUB TRANSFER BOARD) MISC 1 Device by Does not apply route as needed. 1 each 0   montelukast (SINGULAIR) 10 MG tablet TAKE 1 TABLET BY MOUTH NIGHTLY 90 tablet 3   Multiple Vitamins-Minerals (HAIR SKIN & NAILS) TABS Take 1 tablet by mouth daily.     naloxone (NARCAN) nasal spray 4 mg/0.1 mL Place 1 spray into the nose as needed for up to 365 doses (for opioid-induced respiratory depresssion). In case of emergency (overdose), spray once into each nostril. If no response within 3 minutes, repeat application and call 911. 1 each 0   ondansetron (ZOFRAN) 4 MG tablet  Take 1 tablet (4 mg total) by mouth every 8 (eight) hours as needed for nausea or vomiting. 90 tablet 1   pantoprazole (PROTONIX) 40 MG tablet Take 1 tablet (40 mg total) by mouth daily. 30 min before food 90 tablet 3   pregabalin (LYRICA) 75 MG capsule Take 1 capsule (75 mg total) by mouth 3 (three) times daily. 90 capsule 5   rosuvastatin (CRESTOR) 20 MG tablet TAKE 1 TABLET BY MOUTH DAILY 90 tablet 1   senna-docusate (SENOKOT-S) 8.6-50 MG tablet Take 1 tablet by mouth at bedtime as  needed for mild constipation. 10 tablet 0   tirzepatide (MOUNJARO) 15 MG/0.5ML Pen Inject 15 mg into the skin once a week. 6 mL 3   Vibegron (GEMTESA) 75 MG TABS Take 1 tablet (75 mg total) by mouth daily. 30 tablet 11   ALPRAZolam (XANAX) 0.25 MG tablet Take 1 tablet (0.25 mg total) by mouth 2 (two) times daily as needed for anxiety. See SIG change was taking 1/2 of 0.5 tablet bid prn changed to 0.25 bid prn. D/c 0.5 xanax (Patient not taking: Reported on 02/02/2023) 60 tablet 2   No current facility-administered medications for this visit.    Allergies:   Morphine and codeine, Penicillins, Ambien [zolpidem], Aspirin, and Oxytetracycline    Social History:  The patient  reports that she has never smoked. She has never used smokeless tobacco. She reports that she does not drink alcohol and does not use drugs.   Family History:  The patient's family history includes Alcohol abuse in her father; Arthritis in her maternal grandfather, maternal grandmother, mother, paternal grandfather, and paternal grandmother; Breast cancer in her maternal aunt; Cancer in her father and maternal aunt; Cerebral aneurysm in her father; Diabetes in her father, maternal grandmother, and paternal grandmother; Heart disease in her maternal grandfather; Hypertension in her father, maternal grandfather, maternal grandmother, paternal grandfather, and paternal grandmother; Kidney disease in her mother; Mental illness in her sister; Peripheral vascular disease in her father; Skin cancer in her father; Varicose Veins in her mother.    ROS:  Please see the history of present illness.   Otherwise, review of systems are positive for none.   All other systems are reviewed and negative.    PHYSICAL EXAM: VS:  BP 104/70 (BP Location: Left Arm, Patient Position: Sitting, Cuff Size: Large)   Pulse 67   Ht 5\' 5"  (1.651 m)   Wt 245 lb (111.1 kg)   SpO2 97%   BMI 40.77 kg/m  , BMI Body mass index is 40.77 kg/m. GEN: Well  nourished, well developed, in no acute distress  HEENT: normal  Neck: no JVD, carotid bruits, or masses Cardiac: Irregularly irregular; no murmurs, rubs, or gallops, trace edema  Respiratory:  clear to auscultation bilaterally, normal work of breathing GI: soft, nontender, nondistended, + BS MS: no deformity or atrophy  Skin: warm and dry, no rash Neuro:  Strength and sensation are intact Psych: euthymic mood, full affect   EKG:  EKG is ordered today. The ekg ordered today demonstrates : Atrial fibrillation Rightward axis Low voltage QRS When compared with ECG of 06-Jun-2021 18:03, Nonspecific T wave abnormality, improved in Inferior leads Nonspecific T wave abnormality no longer evident in Anterior leads    Recent Labs: 01/20/2023: ALT 16; BUN 24; Creatinine, Ser 1.51; Hemoglobin 13.7; Platelets 218.0; Potassium 4.2; Sodium 142; TSH 4.63    Lipid Panel    Component Value Date/Time   CHOL 126 01/20/2023 1603   CHOL 185  10/10/2014 0822   TRIG 109.0 01/20/2023 1603   HDL 75.30 01/20/2023 1603   HDL 94 10/10/2014 0822   CHOLHDL 2 01/20/2023 1603   VLDL 21.8 01/20/2023 1603   LDLCALC 29 01/20/2023 1603   LDLCALC 73 10/10/2014 0822      Wt Readings from Last 3 Encounters:  02/02/23 245 lb (111.1 kg)  01/20/23 253 lb (114.8 kg)  01/12/23 248 lb 9.6 oz (112.8 kg)           No data to display            ASSESSMENT AND PLAN:  1.  Permanent atrial fibrillation: Ventricular rate is controlled without medications.  Continue anticoagulation with Eliquis 5 mg twice daily.  Recent labs showed a creatinine of 1.5 but she does not meet criteria for reduced dose considering her age and weight.  2.  Chronic diastolic heart failure: She appears to be euvolemic and has been using furosemide 20 mg with an additional 20 mg if needed for volume overload.  3. Essential hypertension: Her blood pressure has been on the low side and I reviewed recent office readings.  I decreased  losartan to 25 mg daily.  4.  Right arm hematoma after a recent fall: Seems to be gradually improving.  Continue anticoagulation given high risk of stroke with interruption.  5.  Stage III chronic kidney disease: Stable.    Disposition:   FU with me in 6 months  Signed,  Lorine Bears, MD  02/02/2023 4:00 PM    [facility]

## 2023-02-03 DIAGNOSIS — M25512 Pain in left shoulder: Secondary | ICD-10-CM | POA: Diagnosis not present

## 2023-02-03 DIAGNOSIS — Z96612 Presence of left artificial shoulder joint: Secondary | ICD-10-CM | POA: Diagnosis not present

## 2023-02-03 NOTE — Progress Notes (Signed)
ANALAYAH, LAMKINS B ([id number]) [id number] Page 1 of 1 Visit Report for 01/26/2023 Fall Risk Assessment Details Patient Name: Date of Service: [name] 01/26/2023 2:45 PM Medical Record Number: [id number] Patient Account Number: [id number] Date of Birth/Sex: Treating RN: 1950/11/21 (72 y.o. [name] Primary Care Eriq Hufford: [name] Other Clinician: Betha Loa Referring Latoia Eyster: Treating Treniya Lobb/Extender: Amedeo Kinsman in Treatment: 16 Fall Risk Assessment Items Have you had 2 or more falls in the last 12 monthso 0 No Have you had any fall that resulted in injury in the last 12 monthso 0 Yes FALLS RISK SCREEN History of falling - immediate or within 3 months 25 Yes Secondary diagnosis (Do you have 2 or more medical diagnoseso) 15 Yes Ambulatory aid None/bed rest/wheelchair/nurse 0 Yes Crutches/cane/walker 15 Yes Furniture 0 No Intravenous therapy Access/Saline/Heparin Lock 0 No Gait/Transferring Normal/ bed rest/ wheelchair 0 Yes Weak (short steps with or without shuffle, stooped but able to lift head while walking, may seek 10 Yes support from furniture) Impaired (short steps with shuffle, may have difficulty arising from chair, head down, impaired 20 Yes balance) Mental Status Oriented to own ability 0 Yes Electronic Signature(s) Signed: 01/27/2023 5:39:21 PM By: Betha Loa Signed: 02/03/2023 4:43:41 PM By: Yevonne Pax RN Entered By: Betha Loa on 01/26/2023 15:27:15

## 2023-02-04 ENCOUNTER — Ambulatory Visit: Payer: Medicare HMO | Admitting: Physician Assistant

## 2023-02-05 ENCOUNTER — Other Ambulatory Visit: Payer: Self-pay | Admitting: Nurse Practitioner

## 2023-02-05 ENCOUNTER — Telehealth: Payer: Self-pay | Admitting: Nurse Practitioner

## 2023-02-05 NOTE — Telephone Encounter (Signed)
Pt would like to be called concerning her mounjaro

## 2023-02-08 ENCOUNTER — Ambulatory Visit: Payer: Medicare HMO | Admitting: Obstetrics and Gynecology

## 2023-02-08 NOTE — Telephone Encounter (Signed)
Called pt to see what was going on and she stated that when she went to give her self the injection Thursday the needle went in and when she went to take the needle out she stated medication started coming out. Pt stated she wanted to know if she should give herself another injection because she is not sure if the medication went in or how much, after speaking with Dr. Birdie Sons he advised she just wait because if she did receive any medications he does not want to add more on top as it could cause side effects pt was informed and she stated that her sugars have been good she stated se had 123 this am and yesterday am it was 126. Pt verbalized understanding and stated she will just wait

## 2023-02-09 ENCOUNTER — Other Ambulatory Visit: Payer: Self-pay | Admitting: Nurse Practitioner

## 2023-02-09 ENCOUNTER — Telehealth: Payer: Self-pay | Admitting: Nurse Practitioner

## 2023-02-09 NOTE — Telephone Encounter (Signed)
Called pt and informed her.

## 2023-02-09 NOTE — Telephone Encounter (Signed)
Patient just called and said her husband just tested positive for COVID. She said she also took a test, but it said negative. She wants to know what she should do. Her number is [phone]. She would like if someone can call her.

## 2023-02-10 ENCOUNTER — Telehealth: Payer: Medicare HMO | Admitting: Nurse Practitioner

## 2023-02-10 ENCOUNTER — Ambulatory Visit: Payer: Medicare HMO

## 2023-02-10 DIAGNOSIS — U071 COVID-19: Secondary | ICD-10-CM | POA: Diagnosis not present

## 2023-02-10 DIAGNOSIS — R059 Cough, unspecified: Secondary | ICD-10-CM | POA: Diagnosis not present

## 2023-02-10 NOTE — Telephone Encounter (Signed)
Patients husband just called and said his wife took 3 different tested. And she tested positive for Covid. He would like to see if she can get something prescribed for her to what can help her. She has a bad cough. Her number is [phone].

## 2023-02-11 ENCOUNTER — Ambulatory Visit: Payer: Medicare HMO | Admitting: Physician Assistant

## 2023-02-11 DIAGNOSIS — G4700 Insomnia, unspecified: Secondary | ICD-10-CM | POA: Diagnosis not present

## 2023-02-11 DIAGNOSIS — F32A Depression, unspecified: Secondary | ICD-10-CM | POA: Diagnosis not present

## 2023-02-11 DIAGNOSIS — G4733 Obstructive sleep apnea (adult) (pediatric): Secondary | ICD-10-CM | POA: Diagnosis not present

## 2023-02-11 DIAGNOSIS — R0681 Apnea, not elsewhere classified: Secondary | ICD-10-CM | POA: Diagnosis not present

## 2023-02-11 DIAGNOSIS — F419 Anxiety disorder, unspecified: Secondary | ICD-10-CM | POA: Diagnosis not present

## 2023-02-11 DIAGNOSIS — I10 Essential (primary) hypertension: Secondary | ICD-10-CM | POA: Diagnosis not present

## 2023-02-15 ENCOUNTER — Encounter: Payer: Self-pay | Admitting: Obstetrics and Gynecology

## 2023-02-15 ENCOUNTER — Other Ambulatory Visit: Payer: Self-pay

## 2023-02-15 ENCOUNTER — Other Ambulatory Visit: Payer: Self-pay | Admitting: Nurse Practitioner

## 2023-02-15 ENCOUNTER — Telehealth: Payer: Medicare HMO | Admitting: Obstetrics and Gynecology

## 2023-02-15 ENCOUNTER — Telehealth: Payer: Self-pay | Admitting: Nurse Practitioner

## 2023-02-15 DIAGNOSIS — I639 Cerebral infarction, unspecified: Secondary | ICD-10-CM

## 2023-02-15 DIAGNOSIS — G8194 Hemiplegia, unspecified affecting left nondominant side: Secondary | ICD-10-CM

## 2023-02-15 DIAGNOSIS — I6930 Unspecified sequelae of cerebral infarction: Secondary | ICD-10-CM

## 2023-02-15 DIAGNOSIS — N390 Urinary tract infection, site not specified: Secondary | ICD-10-CM

## 2023-02-15 MED ORDER — CEPHALEXIN 250 MG PO CAPS: 250.0000 mg | ORAL_CAPSULE | Freq: Every day | ORAL | 1 refills | Status: DC

## 2023-02-15 MED ORDER — TUB TRANSFER BOARD MISC: 1.0000 | 0 refills | Status: AC | PRN

## 2023-02-15 MED ORDER — GEMTESA 75 MG PO TABS: 1.0000 | ORAL_TABLET | Freq: Every day | ORAL | 11 refills | Status: DC

## 2023-02-15 NOTE — Telephone Encounter (Signed)
Pt called stating she need a script for a transfer bench because hers is broken. Pt stated she already called her insurance company and they stated she need a PA for it

## 2023-02-15 NOTE — Progress Notes (Signed)
Virtual Visit via Telephone Note  I connected with [name] on 02/15/23 at  3:00 PM EST by telephone and verified that I am speaking with the correct person using two identifiers. We had initially planned to do a video visit but she had connectivity issues.   Location: Patient: At her home Provider: In the office with closed door for privacy protection.    I discussed the limitations, risks, security and privacy concerns of performing an evaluation and management service by telephone and the availability of in person appointments. I also discussed with the patient that there may be a patient responsible charge related to this service. The patient expressed understanding and agreed to proceed.   History of Present Illness:  Patient reports she was taken off her prophylaxis during rehab for her shoulder and ended up with a UTI. She currently has COVID and had a fall a few weeks ago.     Observations/Objective: Patient is alert and oriented and speaking in full sentences. She does not sound short of breath.    Assessment and Plan: As patient is currently mildly immunocompromise regarding her COVID, and she had a relapse of a UTI while she was in rehab for her shoulder, will continue her on antibiotics and will consider changing to methenamine at next visit.  Patient is also doing well on her Gemtesa 75 mg daily.  Will plan for her to continue on this.  She says that she typically takes this around noon to 2:00 and that it works very well for her on her current regimen. Patient does endorse that her estrogen cream has been a little difficult to use with her shoulder being out of commission.  She endorses that she had a little bit of irritated vaginal bleeding where she believes she scratched herself.  We discussed different options for estrogen or lubrication use and all of them are similar in ability to apply.  The only other option would be to use an Estring which I would then need to  apply once every 3 months.  Patient decided that she would continue to use the estrogen cream as best she could.    Follow Up Instructions:  Patient to continue on her regimen of low-dose Keflex, Gemtesa 75 mg daily, estrogen cream twice weekly.  Patient will follow-up in 6 months for reevaluation and we will consider changing to methenamine at that time from the Keflex.  Patient is agreeable to plan of care at this time.   I discussed the assessment and treatment plan with the patient. The patient was provided an opportunity to ask questions and all were answered. The patient agreed with the plan and demonstrated an understanding of the instructions.   The patient was advised to call back or seek an in-person evaluation if the symptoms worsen or if the condition fails to improve as anticipated.  I provided 10 minutes of non-face-to-face time during this encounter.   Selmer Dominion, NP

## 2023-02-15 NOTE — Telephone Encounter (Signed)
Called pt to inform of DME order being printed and she stated she would like it faxed to [facility].    DME order has been faxed to [phone] with a completed transmission log

## 2023-02-16 ENCOUNTER — Telehealth: Payer: Self-pay | Admitting: Family Medicine

## 2023-02-16 NOTE — Telephone Encounter (Signed)
Called Patient to let her know that Dr. Birdie Sons recommends to be evaluated today at [facility] or the ED due to her symptoms and breathing history. Patient sounded worse than earlier today and stated she just got in the bed and she is going to rest for a little bit then she will go.

## 2023-02-16 NOTE — Telephone Encounter (Signed)
Patient has a history of pneumonia and she tested positive for Covid last Wednesday. Patient was prescribed Mulnuprovair for Covid and started it on Friday. Patient now states she is coughing up Almost Black Sputum, rally tired, having a little SOB and chest tightness.  Patient states last Monday her husband tested positive for Covid and Bronchitis.

## 2023-02-16 NOTE — Telephone Encounter (Signed)
Please call the patient. She is on my schedule for 11/27 for feels like something is not right. Please get more details on this so it can be determined if this can wait until the end of the day on Wednesday. Thanks.

## 2023-02-17 ENCOUNTER — Ambulatory Visit: Payer: Medicare HMO | Admitting: Family Medicine

## 2023-02-17 NOTE — Telephone Encounter (Signed)
Patient states she feels better and is not coughing up sputum anymore and her chest is not tight like it was. Patient states if her symptoms return she will be evaluated at [facility] since we will be closed for the Holiday.

## 2023-02-17 NOTE — Telephone Encounter (Signed)
Noted  

## 2023-02-17 NOTE — Telephone Encounter (Signed)
Please call the patient and make sure she went to get looked at yesterday. If she did get evaluated please see if she wants to keep this appointment.

## 2023-02-23 ENCOUNTER — Ambulatory Visit: Payer: Medicare HMO | Admitting: Physician Assistant

## 2023-02-23 ENCOUNTER — Other Ambulatory Visit: Payer: Self-pay

## 2023-02-23 DIAGNOSIS — F32A Depression, unspecified: Secondary | ICD-10-CM

## 2023-02-23 DIAGNOSIS — F418 Other specified anxiety disorders: Secondary | ICD-10-CM

## 2023-02-23 MED ORDER — BUPROPION HCL ER (XL) 150 MG PO TB24: 150.0000 mg | ORAL_TABLET | Freq: Every day | ORAL | 3 refills | Status: DC

## 2023-02-23 MED ORDER — DULOXETINE HCL 60 MG PO CPEP: 60.0000 mg | ORAL_CAPSULE | Freq: Every day | ORAL | 3 refills | Status: DC

## 2023-02-23 NOTE — Telephone Encounter (Signed)
[facility] faxed a refill request for wellbutin and cymbalta    Refills have been sent to [facility]

## 2023-02-24 ENCOUNTER — Ambulatory Visit: Payer: Medicare HMO | Admitting: *Deleted

## 2023-02-24 VITALS — Ht 64.0 in | Wt 247.0 lb

## 2023-02-24 DIAGNOSIS — Z0000 Encounter for general adult medical examination without abnormal findings: Secondary | ICD-10-CM

## 2023-02-24 NOTE — Progress Notes (Signed)
Subjective:   [name] is a 72 y.o. female who presents for an Initial Medicare Annual Wellness Visit.  Visit Complete: Virtual I connected with  Leonel Ramsay on 02/24/23 by a audio enabled telemedicine application and verified that I am speaking with the correct person using two identifiers.  Patient Location: Home  Provider Location: Office/Clinic  I discussed the limitations of evaluation and management by telemedicine. The patient expressed understanding and agreed to proceed.  Vital Signs: Because this visit was a virtual/telehealth visit, some criteria may be missing or patient reported. Any vitals not documented were not able to be obtained and vitals that have been documented are patient reported.   Cardiac Risk Factors include: advanced age (>52men, >38 women);diabetes mellitus;dyslipidemia;hypertension;obesity (BMI >30kg/m2);Other (see comment), Risk factor comments: PAD     Objective:    Today's Vitals   02/24/23 1038 02/24/23 1039  Weight: 247 lb (112 kg)   Height: 5\' 4"  (1.626 m)   PainSc:  5    Body mass index is 42.4 kg/m.     02/24/2023   10:57 AM 09/01/2022    7:09 AM 08/19/2022    4:02 PM 07/17/2022    2:21 PM 01/26/2022    7:12 AM 12/23/2021   11:42 AM 06/23/2021    1:12 PM  Advanced Directives  Does Patient Have a Medical Advance Directive? Yes Yes Yes Yes Yes No No  Type of Estate agent of [place];Living will Healthcare Power of [place];Living will Healthcare Power of [place];Living will Healthcare Power of [place];Living will     Does patient want to make changes to medical advance directive?  No - Patient declined No - Patient declined    No - Patient declined  Copy of Healthcare Power of Attorney in Chart? No - copy requested No - copy requested No - copy requested      Would patient like information on creating a medical advance directive?      No - Patient declined     Current Medications (verified) Outpatient  Encounter Medications as of 02/24/2023  Medication Sig   acetaminophen (TYLENOL) 500 MG tablet Take 2 tablets (1,000 mg total) by mouth every 8 (eight) hours.   albuterol (VENTOLIN HFA) 108 (90 Base) MCG/ACT inhaler Inhale 2 puffs into the lungs every 6 (six) hours as needed.   ALPRAZolam (XANAX) 0.25 MG tablet Take 1 tablet (0.25 mg total) by mouth 2 (two) times daily as needed for anxiety. See SIG change was taking 1/2 of 0.5 tablet bid prn changed to 0.25 bid prn. D/c 0.5 xanax   buPROPion (WELLBUTRIN XL) 150 MG 24 hr tablet Take 1 tablet (150 mg total) by mouth daily.   calcium-vitamin D (OSCAL WITH D) 500-200 MG-UNIT tablet Take 1 tablet by mouth 2 (two) times daily with a meal.   cephALEXin (KEFLEX) 250 MG capsule Take 1 capsule (250 mg total) by mouth daily.   clobetasol cream (TEMOVATE) 0.05 % Apply 1 application topically 2 (two) times daily as needed (skin irritation).    colchicine 0.6 MG tablet Take 1 tablet (0.6 mg total) by mouth daily. Repeat I 1 hour if needed. No more than 3 tablets in 24 hours PRN for GOUT FLARE   Continuous Glucose Sensor (FREESTYLE LIBRE 3 PLUS SENSOR) MISC PLACE ONE SENSOR ON THE SKIN EVERY FOURTEEN DAYS.USE TO CHECK GLUCOSE CONTINUOUSLY   cyanocobalamin (VITAMIN B12) 1000 MCG tablet Take 1,000 mcg by mouth daily.   diclofenac Sodium (VOLTAREN) 1 % GEL Apply 2  g topically 3 (three) times daily.   DULoxetine (CYMBALTA) 60 MG capsule Take 1 capsule (60 mg total) by mouth daily.   ELIQUIS 5 MG TABS tablet TAKE 1 TABLET BY MOUTH TWICE DAILY   empagliflozin (JARDIANCE) 25 MG TABS tablet Take 1 tablet (25 mg total) by mouth daily before breakfast.   ergocalciferol (VITAMIN D2) 1.25 MG (50000 UT) capsule Take 50,000 Units by mouth once a week.   estradiol (ESTRACE) 0.1 MG/GM vaginal cream Apply 0.5mg  (pea-sized amount)  just inside the vaginal introitus with a finger-tip on Monday, Wednesday and Friday nights,   ferrous sulfate 324 MG TBEC Take 324 mg by mouth daily  with breakfast.   fluconazole (DIFLUCAN) 150 MG tablet Take 1 tablet by mouth x 1 dose. May repeat in 72 hours if needed.   Fluticasone-Umeclidin-Vilant (TRELEGY ELLIPTA) 200-62.5-25 MCG/ACT AEPB INHALE ONE PUFF INTO THE LUNGS EVERY DAY   folic acid (FOLVITE) 800 MCG tablet Take 800 mcg by mouth daily.   furosemide (LASIX) 40 MG tablet Take 1 tablet (40 mg total) by mouth as directed. Take 1 tablet daily. May take additional 1 tablet If weight gain of greater than 2 pounds in 24 hours or 5 pounds in one week.   glucose blood (ONETOUCH ULTRA) test strip TEST TWICE DAILY AS DIRECTED   ipratropium-albuterol (DUONEB) 0.5-2.5 (3) MG/3ML SOLN Take 3 mLs by nebulization every 6 (six) hours as needed.   Lancets (ONETOUCH ULTRASOFT) lancets Use as instructed   levocetirizine (XYZAL) 5 MG tablet TAKE ONE TABLET BY MOUTH AT BEDTIME AS NEEDED FOR ALLERGIES   levothyroxine (SYNTHROID) 137 MCG tablet Take 1 tablet (137 mcg total) by mouth daily before breakfast.   Loperamide-Simethicone 2-125 MG TABS Take 1 tablet by mouth daily as needed. May repeat in 1 hour   losartan (COZAAR) 25 MG tablet Take 1 tablet (25 mg total) by mouth daily.   Magnesium ([employer] TRIPLE MAGNESIUM COMPLEX) 400 MG CAPS Take 1 capsule by mouth daily.   Misc. Devices (TUB TRANSFER BOARD) MISC 1 Device by Does not apply route as needed.   montelukast (SINGULAIR) 10 MG tablet TAKE 1 TABLET BY MOUTH NIGHTLY   Multiple Vitamins-Minerals (HAIR SKIN & NAILS) TABS Take 1 tablet by mouth daily.   naloxone (NARCAN) nasal spray 4 mg/0.1 mL Place 1 spray into the nose as needed for up to 365 doses (for opioid-induced respiratory depresssion). In case of emergency (overdose), spray once into each nostril. If no response within 3 minutes, repeat application and call 911.   ondansetron (ZOFRAN) 4 MG tablet Take 1 tablet (4 mg total) by mouth every 8 (eight) hours as needed for nausea or vomiting.   pantoprazole (PROTONIX) 40 MG tablet Take 1 tablet (40 mg  total) by mouth daily. 30 min before food   pregabalin (LYRICA) 75 MG capsule Take 1 capsule (75 mg total) by mouth 3 (three) times daily.   rosuvastatin (CRESTOR) 20 MG tablet TAKE 1 TABLET BY MOUTH DAILY   tirzepatide (MOUNJARO) 15 MG/0.5ML Pen Inject 15 mg into the skin once a week.   Vibegron (GEMTESA) 75 MG TABS Take 1 tablet (75 mg total) by mouth daily.   senna-docusate (SENOKOT-S) 8.6-50 MG tablet Take 1 tablet by mouth at bedtime as needed for mild constipation. (Patient not taking: Reported on 02/24/2023)   No facility-administered encounter medications on file as of 02/24/2023.    Allergies (verified) Morphine and codeine, Penicillins, Ambien [zolpidem], Aspirin, and Oxytetracycline   History: Past Medical History:  Diagnosis Date  Anemia    Anxiety    a.) on BZO (alprazolam) PRN   Arthritis    Asthma    Atypical chest pain    a. 03/2018 MV: No ischemia/infarct. EF >65%.   Carotid arterial disease (HCC)    a. 03/2020 U/S: RICA <50, LICA nl.   Chronic heart failure with preserved ejection fraction (HFpEF) (HCC)    a. 08/2013 Echo: EF 60-65%; b. 08/2019 Echo: EF 60-65%; c. 03/2020 Echo: EF 60-65%; d. 05/2021 Echo: EF 55-60%, no rmwa, nl RV fxn, RVSP 54.41mmHg. Mildly dil LA, mild MR, mod TR.   Chronic, continuous use of opioids    a.) has naloxone Rx available   CKD (chronic kidney disease), stage III (HCC)    Complication of anesthesia    a.) RIGHT hemidiaphragm paralysis s/p interscalene block for shoulder arthroplasty in 2017 --> caused "permanent lung damage" per patient --> REFUSES further neuraxial anesthesia   Cystocele    Deep vein thrombosis (DVT) of right lower extremity (HCC)    Depression    Diaphragm paralysis 2018   a.) RIGHT hemidiaphragm paralysis s/p interscalene block for shoulder arthroplasty in 2017; refuses additional regional/neuraxial anesthesia   DM (diabetes mellitus) type II, controlled, with peripheral vascular disorder (HCC) 08/31/2014   Dyspnea     11/08/2018 - "more now due to lack of exercise"   Edema    Fatty liver    GERD (gastroesophageal reflux disease)    Gout    Heart murmur    Hemiparesis affecting left side as late effect of cerebrovascular accident (HCC)    Hiatal hernia    a.) s/p repair   History of IBS    History of methicillin resistant staphylococcus aureus (MRSA) 2008   History of multiple strokes    Hyperlipidemia    Hypertension    Hypothyroidism    Insomnia    Long term current use of anticoagulant    a.) apixaban   Mitral regurgitation    a. 05/2021 Echo: Mild MR.   Nephrolithiasis    Neuropathy involving both lower extremities    Non-healing ulcer of left foot (HCC)    Obesity, Class II, BMI 35-39.9, with comorbidity    OSA on CPAP    PAF (paroxysmal atrial fibrillation) (HCC)    a.) 07/2013 Event monitor: Freq PVCs and 3 short runs of Afib; b.) CHA2DS2VASc = 8 (age, sex, CHF, HTN, CVA/DVT x2, vascular disease history, T2DM);  b.) rate/rhythm maintained without pharmacological intervention; chronically anticoagulated with apixaban   PAH (pulmonary artery hypertension) (HCC)    a. 05/2021 Echo: RVSP 54.71mmHg.   Peripheral vascular disease (HCC)    a. 03/2018 LE Angio: Nl renal arteries. Nl aorta & iliacs. Nl LLE arterial flow; b. 06/2020 ABI's: Nl bilat.   T2DM (type 2 diabetes mellitus) (HCC)    Tricuspid regurgitation    a. 05/2021 Echo: Mod TR (in setting of RVSP 54.13mmHg).   Umbilical hernia    a.) s/p repair   Urinary incontinence    Venous stasis dermatitis of both lower extremities    Past Surgical History:  Procedure Laterality Date   ANKLE SURGERY Right    APPLICATION OF WOUND VAC Left 06/27/2015   Procedure: APPLICATION OF WOUND VAC ( POSSIBLE ) ;  Surgeon: Annice Needy, MD;  Location: [facility] ORS;  Service: Vascular;  Laterality: Left;   APPLICATION OF WOUND VAC Left 09/25/2016   Procedure: APPLICATION OF WOUND VAC;  Surgeon: Recardo Evangelist, DPM;  Location: [facility] ORS;  Service: Podiatry;  Laterality: Left;   arm surgery     right     CHOLECYSTECTOMY     COLONOSCOPY     COLONOSCOPY WITH PROPOFOL N/A 01/11/2017   Procedure: COLONOSCOPY WITH PROPOFOL;  Surgeon: Christena Deem, MD;  Location: [facility] ENDOSCOPY;  Service: Endoscopy;  Laterality: N/A;   COLONOSCOPY WITH PROPOFOL N/A 02/22/2017   Procedure: COLONOSCOPY WITH PROPOFOL;  Surgeon: Christena Deem, MD;  Location: [facility] ENDOSCOPY;  Service: Endoscopy;  Laterality: N/A;   COLONOSCOPY WITH PROPOFOL N/A 05/31/2017   Procedure: COLONOSCOPY WITH PROPOFOL;  Surgeon: Christena Deem, MD;  Location: [facility] ENDOSCOPY;  Service: Endoscopy;  Laterality: N/A;   COLONOSCOPY WITH PROPOFOL N/A 01/26/2022   Procedure: COLONOSCOPY WITH PROPOFOL;  Surgeon: Regis Bill, MD;  Location: [facility] ENDOSCOPY;  Service: Endoscopy;  Laterality: N/A;   ESOPHAGOGASTRODUODENOSCOPY (EGD) WITH PROPOFOL N/A 01/11/2017   Procedure: ESOPHAGOGASTRODUODENOSCOPY (EGD) WITH PROPOFOL;  Surgeon: Christena Deem, MD;  Location: [facility] ENDOSCOPY;  Service: Endoscopy;  Laterality: N/A;   ESOPHAGOGASTRODUODENOSCOPY (EGD) WITH PROPOFOL N/A 10/25/2018   Procedure: ESOPHAGOGASTRODUODENOSCOPY (EGD) WITH PROPOFOL;  Surgeon: Christena Deem, MD;  Location: [facility] ENDOSCOPY;  Service: Endoscopy;  Laterality: N/A;   ESOPHAGOGASTRODUODENOSCOPY (EGD) WITH PROPOFOL N/A 11/29/2019   Procedure: ESOPHAGOGASTRODUODENOSCOPY (EGD) WITH PROPOFOL;  Surgeon: [place], Boykin Nearing, MD;  Location: [facility] ENDOSCOPY;  Service: Gastroenterology;  Laterality: N/A;   ESOPHAGOGASTRODUODENOSCOPY (EGD) WITH PROPOFOL N/A 01/26/2022   Procedure: ESOPHAGOGASTRODUODENOSCOPY (EGD) WITH PROPOFOL;  Surgeon: Regis Bill, MD;  Location: [facility] ENDOSCOPY;  Service: Endoscopy;  Laterality: N/A;  DM   HIATAL HERNIA REPAIR     I & D EXTREMITY Left 06/27/2015   Procedure: IRRIGATION AND DEBRIDEMENT EXTREMITY            ( CALF HEMATOMA ) POSSIBLE WOUND VAC;  Surgeon: Annice Needy, MD;  Location: [facility]  ORS;  Service: Vascular;  Laterality: Left;   INCISION AND DRAINAGE OF WOUND Left 09/25/2016   Procedure: IRRIGATION AND DEBRIDEMENT - PARTIAL RESECTION OF ACHILLES TENDON WITH WOUND VAC APPLICATION;  Surgeon: Recardo Evangelist, DPM;  Location: [facility] ORS;  Service: Podiatry;  Laterality: Left;   INCISION AND DRAINAGE OF WOUND Left 11/09/2018   Procedure: Excision of left foot wound with ACell placement;  Surgeon: Peggye Form, DO;  Location: [facility] OR;  Service: Plastics;  Laterality: Left;   IRRIGATION AND DEBRIDEMENT ABSCESS Left 09/07/2016   Procedure: IRRIGATION AND DEBRIDEMENT ABSCESS LEFT HEEL;  Surgeon: Recardo Evangelist, DPM;  Location: [facility] ORS;  Service: Podiatry;  Laterality: Left;   KIDNEY STONE SURGERY Right    LITHOTRIPSY     LOWER EXTREMITY ANGIOGRAPHY Left 04/04/2018   Procedure: LOWER EXTREMITY ANGIOGRAPHY;  Surgeon: Annice Needy, MD;  Location: [facility] INVASIVE CV LAB;  Service: Cardiovascular;  Laterality: Left;   NM GATED MYOVIEW ([facility] HX)  04/2015   Likely breast attenuation. LOW RISK study. Normal EF 55-60%.   OTHER SURGICAL HISTORY     08/2016 or 09/2016 surgery on achilles tenso h/o staph infection heal. Dr. Orland Jarred   removal of left hematoma Left    leg   REVERSE SHOULDER ARTHROPLASTY Right 10/08/2015   Procedure: REVERSE SHOULDER ARTHROPLASTY;  Surgeon: Christena Flake, MD;  Location: [facility] ORS;  Service: Orthopedics;  Laterality: Right;   REVERSE SHOULDER ARTHROPLASTY Left 09/01/2022   Procedure: REVERSE SHOULDER ARTHROPLASTY;  Surgeon: Signa Kell, MD;  Location: [facility] ORS;  Service: Orthopedics;  Laterality: Left;   SLEEVE GASTROPLASTY     TONSILLECTOMY     TOTAL KNEE ARTHROPLASTY Left  TOTAL KNEE ARTHROPLASTY  2013   TOTAL SHOULDER REPLACEMENT Right    TRANSESOPHAGEAL ECHOCARDIOGRAM  08/08/2013   Mild LVH, EF 60-65%. Moderate LA dilation and mild RA dilation. Mild MR with no evidence of stenosis and no evidence of endocarditis. A false chordae is noted.    TRANSTHORACIC ECHOCARDIOGRAM  08/03/2013   Mild-Moderate concentric LVH, EF 60-65%. Normal diastolic function. Mild LA dilation. Mild MR with possible vegetation  - not confirmed on TEE    ULNAR NERVE TRANSPOSITION Right 08/06/2016   Procedure: ULNAR NERVE DECOMPRESSION/TRANSPOSITION;  Surgeon: Christena Flake, MD;  Location: [facility] ORS;  Service: Orthopedics;  Laterality: Right;   UMBILICAL HERNIA REPAIR     UPPER GI ENDOSCOPY     VAGINAL HYSTERECTOMY     Family History  Problem Relation Age of Onset   Skin cancer Father    Diabetes Father    Hypertension Father    Peripheral vascular disease Father    Cancer Father    Cerebral aneurysm Father    Alcohol abuse Father    Varicose Veins Mother    Kidney disease Mother    Arthritis Mother    Mental illness Sister    Cancer Maternal Aunt        breast   Breast cancer Maternal Aunt    Arthritis Maternal Grandmother    Hypertension Maternal Grandmother    Diabetes Maternal Grandmother    Arthritis Maternal Grandfather    Heart disease Maternal Grandfather    Hypertension Maternal Grandfather    Arthritis Paternal Grandmother    Hypertension Paternal Grandmother    Diabetes Paternal Grandmother    Arthritis Paternal Grandfather    Hypertension Paternal Grandfather    Bladder Cancer Neg Hx    Kidney cancer Neg Hx    Social History   Socioeconomic History   Marital status: Married    Spouse name: Not on file   Number of children: Not on file   Years of education: Not on file   Highest education level: Not on file  Occupational History   Not on file  Tobacco Use   Smoking status: Never   Smokeless tobacco: Never  Vaping Use   Vaping status: Never Used  Substance and Sexual Activity   Alcohol use: No    Alcohol/week: 0.0 standard drinks of alcohol   Drug use: Never   Sexual activity: Not Currently  Other Topics Concern   Not on file  Social History Narrative   She is currently married -- for 30+ years. Does not work.  Does not smoke or take alcohol. She never smoked. She exercises at least 3 days a week since before her gastric surgery.   Marital status reviewed in history of present illness.   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    Social Determinants of Corporate investment banker Strain: Low Risk  (02/24/2023)   Overall Financial Resource Strain (CARDIA)    Difficulty of Paying Living Expenses: Not hard at all  Food Insecurity: No Food Insecurity (02/24/2023)   Hunger Vital Sign    Worried About Running Out of Food in the Last Year: Never true    Ran Out of Food in the Last Year: Never true  Transportation Needs: No Transportation Needs (02/24/2023)   PRAPARE - Administrator, Civil Service (Medical): No    Lack of Transportation (Non-Medical): No  Physical Activity: Inactive (02/24/2023)   Exercise  Vital Sign    Days of Exercise per Week: 0 days    Minutes of Exercise per Session: 0 min  Stress: No Stress Concern Present (02/24/2023)   [employer] of Occupational Health - Occupational Stress Questionnaire    Feeling of Stress : Only a little  Social Connections: Moderately Isolated (02/24/2023)   Social Connection and Isolation Panel [NHANES]    Frequency of Communication with Friends and Family: More than three times a week    Frequency of Social Gatherings with Friends and Family: Once a week    Attends Religious Services: Never    Database administrator or Organizations: No    Attends Engineer, structural: Never    Marital Status: Married    Tobacco Counseling Counseling given: Not Answered   Clinical Intake:  Pre-visit preparation completed: Yes  Pain : 0-10 Pain Score: 5  Pain Type: Chronic pain Pain Location: Ankle Pain Orientation: Right Pain Descriptors / Indicators: Sharp Pain Onset: In the past 7 days Pain Frequency: Intermittent  Patient stated that she has had pain in her foot like this  once a year for years since she broke her ankle. Patient stated that she has been overdoing it for several days.     BMI - recorded: 42.4 Nutritional Status: BMI > 30  Obese Nutritional Risks: None Diabetes: Yes (FBS 123) CBG done?: No Did pt. bring in CBG monitor from home?: No  How often do you need to have someone help you when you read instructions, pamphlets, or other written materials from your doctor or pharmacy?: 1 - Never  Interpreter Needed?: No  Information entered by :: R. Aireal Slater LPN   Activities of Daily Living    02/24/2023   10:43 AM 09/01/2022   12:00 PM  In your present state of health, do you have any difficulty performing the following activities:  Hearing? 0 0  Vision? 0 0  Comment glasses   Difficulty concentrating or making decisions? 0 0  Walking or climbing stairs? 1 1  Dressing or bathing? 0 1  Doing errands, shopping? 1 0  Preparing Food and eating ? N   Using the Toilet? N   In the past six months, have you accidently leaked urine? Y   Do you have problems with loss of bowel control? N   Managing your Medications? N   Managing your Finances? N   Housekeeping or managing your Housekeeping? N     Patient Care Team: [name], NP as PCP - General (Nurse Practitioner) Iran Ouch, MD as PCP - Cardiology (Cardiology) Delano Metz, MD as Consulting Physician (Pain Medicine) Marykay Lex, MD as Consulting Physician (Cardiology) Poggi, Excell Seltzer, MD as Consulting Physician (Surgery) Creig Hines, MD as Consulting Physician (Oncology) Signa Kell, MD as Consulting Physician (Orthopedic Surgery)  Indicate any recent Medical Services you may have received from other than [facility] providers in the past year (date may be approximate).     Assessment:   This is a routine wellness examination for [place].  Hearing/Vision screen Hearing Screening - Comments:: No issues Vision Screening - Comments:: glasses   Goals Addressed              This Visit's Progress    Patient Stated       Wants to make her granddaughters wedding cake and walk down the aisle       Depression Screen    02/24/2023   10:51 AM 06/12/2022    3:36 PM  11/27/2021    1:15 PM 07/30/2021   11:44 AM 02/10/2021    3:16 PM 01/15/2021    1:33 PM 07/15/2020    3:30 PM  PHQ 2/9 Scores  PHQ - 2 Score 0 2 0 0 0 0 0  PHQ- 9 Score 0 7    0     Fall Risk    02/24/2023   10:45 AM 08/26/2022    2:10 PM 06/12/2022    3:35 PM 05/25/2022    3:00 PM 03/02/2022   12:04 PM  Fall Risk   Falls in the past year? 1 0 0 0 0  Number falls in past yr: 0  0  0  Injury with Fall? 1  0    Comment bruised arm      Risk for fall due to : History of fall(s);Impaired balance/gait  Impaired mobility  Impaired balance/gait;Impaired mobility  Follow up Falls evaluation completed;Falls prevention discussed  Falls evaluation completed  Falls evaluation completed;Education provided    MEDICARE RISK AT HOME: Medicare Risk at Home Any stairs in or around the home?: Yes If so, are there any without handrails?: No Home free of loose throw rugs in walkways, pet beds, electrical cords, etc?: Yes Adequate lighting in your home to reduce risk of falls?: Yes Life alert?: No Use of a cane, walker or w/c?: Yes (walker) Grab bars in the bathroom?: Yes Shower chair or bench in shower?: Yes Elevated toilet seat or a handicapped toilet?: Yes      Cognitive Function:        02/24/2023   10:58 AM  6CIT Screen  What Year? 0 points  What month? 0 points  What time? 0 points  Count back from 20 0 points  Months in reverse 0 points  Repeat phrase 0 points  Total Score 0 points    Immunizations Immunization History  Administered Date(s) Administered   Fluad Quad(high Dose 65+) 02/07/2020, 02/06/2021, 11/27/2021   Fluad Trivalent(High Dose 65+) 01/12/2023   Influenza, High Dose Seasonal PF 11/28/2014, 02/02/2018, 01/31/2019, 02/20/2019   Influenza, Seasonal, Injecte, Preservative  Fre 01/17/2011   Influenza,inj,Quad PF,6+ Mos 12/18/2013   Influenza-Unspecified 12/18/2013, 12/31/2015, 11/27/2016, 02/02/2018   PFIZER(Purple Top)SARS-COV-2 Vaccination 06/29/2019, 07/29/2019, 01/22/2020   Pneumococcal Conjugate-13 11/27/2016   Pneumococcal Polysaccharide-23 01/17/2011, 12/17/2014   Tdap 03/09/2018    TDAP status: Up to date  Flu Vaccine status: Up to date  Pneumococcal vaccine status: Up to date  Covid-19 vaccine status: Information provided on how to obtain vaccines.   Qualifies for Shingles Vaccine? Yes   Zostavax completed No   Shingrix Completed?: No.    Education has been provided regarding the importance of this vaccine. Patient has been advised to call insurance company to determine out of pocket expense if they have not yet received this vaccine. Advised may also receive vaccine at local pharmacy or Health Dept. Verbalized acceptance and understanding.  Screening Tests Health Maintenance  Topic Date Due   Medicare Annual Wellness (AWV)  Never done   Zoster Vaccines- Shingrix (1 of 2) Never done   FOOT EXAM  03/08/2019   Diabetic kidney evaluation - Urine ACR  05/31/2022   MAMMOGRAM  08/22/2022   HEMOGLOBIN A1C  07/21/2023   OPHTHALMOLOGY EXAM  12/16/2023   Diabetic kidney evaluation - eGFR measurement  01/20/2024   DTaP/Tdap/Td (2 - Td or Tdap) 03/09/2028   Colonoscopy  01/27/2032   Pneumonia Vaccine 50+ Years old  Completed   INFLUENZA VACCINE  Completed   DEXA  SCAN  Completed   Hepatitis C Screening  Completed   HPV VACCINES  Aged Out   COVID-19 Vaccine  Discontinued    Health Maintenance  Health Maintenance Due  Topic Date Due   Medicare Annual Wellness (AWV)  Never done   Zoster Vaccines- Shingrix (1 of 2) Never done   FOOT EXAM  03/08/2019   Diabetic kidney evaluation - Urine ACR  05/31/2022   MAMMOGRAM  08/22/2022    Colorectal cancer screening: Type of screening: Colonoscopy. Completed 01/2022. Repeat every 5 years  Mammogram  status: Completed 08/2020. Repeat every yearHas an appointment scheduled 03/04/23  Bone Density status: Completed 09/2014. Results reflect: Bone density results: NORMAL. Repeat every 2 years. Declines at this time  Lung Cancer Screening: (Low Dose CT Chest recommended if Age 95-80 years, 20 pack-year currently smoking OR have quit w/in 15years.) does not qualify.    Additional Screening:  Hepatitis C Screening: does qualify; Completed 04/2017  Vision Screening: Recommended annual ophthalmology exams for early detection of glaucoma and other disorders of the eye. Is the patient up to date with their annual eye exam?  Yes  Who is the provider or what is the name of the office in which the patient attends annual eye exams? Dr. Larence Penning If pt is not established with a provider, would they like to be referred to a provider to establish care? No .   Dental Screening: Recommended annual dental exams for proper oral hygiene  Diabetic Foot Exam: Diabetic Foot Exam: Overdue, Pt has been advised about the importance in completing this exam. Pt is scheduled for diabetic foot exam on Patient needs foot exam and documented at next visit.  Community Resource Referral / Chronic Care Management: CRR required this visit?  No   CCM required this visit?  No     Plan:     I have personally reviewed and noted the following in the patient's chart:   Medical and social history Use of alcohol, tobacco or illicit drugs  Current medications and supplements including opioid prescriptions. Patient is not currently taking opioid prescriptions. Functional ability and status Nutritional status Physical activity Advanced directives List of other physicians Hospitalizations, surgeries, and ER visits in previous 12 months Vitals Screenings to include cognitive, depression, and falls Referrals and appointments  In addition, I have reviewed and discussed with patient certain preventive protocols, quality metrics,  and best practice recommendations. A written personalized care plan for preventive services as well as general preventive health recommendations were provided to patient.     Sydell Axon, LPN   16/03/958   After Visit Summary: (MyChart) Due to this being a telephonic visit, the after visit summary with patients personalized plan was offered to patient via MyChart   Nurse Notes: Patient was advised to call the office if her ankle and foot continues to bother her.

## 2023-02-24 NOTE — Patient Instructions (Signed)
[name] , Thank you for taking time to come for your Medicare Wellness Visit. I appreciate your ongoing commitment to your health goals. Please review the following plan we discussed and let me know if I can assist you in the future.   Referrals/Orders/Follow-Ups/Clinician Recommendations: Consider updating your shingles vaccine.  This is a list of the screening recommended for you and due dates:  Health Maintenance  Topic Date Due   Zoster (Shingles) Vaccine (1 of 2) Never done   Complete foot exam   03/08/2019   Yearly kidney health urinalysis for diabetes  05/31/2022   Mammogram  08/22/2022   Hemoglobin A1C  07/21/2023   Eye exam for diabetics  12/16/2023   Yearly kidney function blood test for diabetes  01/20/2024   Medicare Annual Wellness Visit  02/24/2024   Colon Cancer Screening  01/27/2027   DTaP/Tdap/Td vaccine (2 - Td or Tdap) 03/09/2028   Pneumonia Vaccine  Completed   Flu Shot  Completed   DEXA scan (bone density measurement)  Completed   Hepatitis C Screening  Completed   HPV Vaccine  Aged Out   COVID-19 Vaccine  Discontinued    Advanced directives: (Copy Requested) Please bring a copy of your health care power of attorney and living will to the office to be added to your chart at your convenience.  Next Medicare Annual Wellness Visit scheduled for next year: Yes 02/28/24@ 3:00

## 2023-03-04 ENCOUNTER — Ambulatory Visit
Admission: RE | Admit: 2023-03-04 | Discharge: 2023-03-04 | Disposition: A | Discharge status: Home / Self Care | Payer: Medicare HMO | Source: Ambulatory Visit | Attending: Nurse Practitioner | Admitting: Nurse Practitioner

## 2023-03-04 DIAGNOSIS — Z1231 Encounter for screening mammogram for malignant neoplasm of breast: Secondary | ICD-10-CM | POA: Insufficient documentation

## 2023-03-08 ENCOUNTER — Encounter: Payer: Medicare HMO | Attending: Physician Assistant | Admitting: Physician Assistant

## 2023-03-08 ENCOUNTER — Other Ambulatory Visit: Payer: Self-pay | Admitting: Cardiovascular Disease

## 2023-03-08 DIAGNOSIS — I480 Paroxysmal atrial fibrillation: Secondary | ICD-10-CM | POA: Insufficient documentation

## 2023-03-08 DIAGNOSIS — S51811A Laceration without foreign body of right forearm, initial encounter: Secondary | ICD-10-CM | POA: Diagnosis not present

## 2023-03-08 DIAGNOSIS — L89616 Pressure-induced deep tissue damage of right heel: Secondary | ICD-10-CM | POA: Diagnosis not present

## 2023-03-08 DIAGNOSIS — X58XXXA Exposure to other specified factors, initial encounter: Secondary | ICD-10-CM | POA: Diagnosis not present

## 2023-03-08 DIAGNOSIS — I87323 Chronic venous hypertension (idiopathic) with inflammation of bilateral lower extremity: Secondary | ICD-10-CM | POA: Diagnosis not present

## 2023-03-08 DIAGNOSIS — L89623 Pressure ulcer of left heel, stage 3: Secondary | ICD-10-CM | POA: Insufficient documentation

## 2023-03-08 DIAGNOSIS — Z7901 Long term (current) use of anticoagulants: Secondary | ICD-10-CM | POA: Diagnosis not present

## 2023-03-08 DIAGNOSIS — E11621 Type 2 diabetes mellitus with foot ulcer: Secondary | ICD-10-CM | POA: Insufficient documentation

## 2023-03-08 NOTE — Telephone Encounter (Signed)
Prescription refill request for Eliquis received. Indication: PAF Last office visit: 02/02/23  Terrilee Files MD Scr: 1.51 on 01/20/23  Epic Age: 72 Weight: 111.1kg  Based on above findings Eliquis 5mg  twice daily is the appropriate dose.  Refill approved.

## 2023-03-08 NOTE — Progress Notes (Addendum)
[name] ([id number]) [id number].pdf Page 1 of 10 Visit Report for 03/08/2023 Arrival Information Details Patient Name: Date of Service: [name] 03/08/2023 2:45 PM Medical Record Number: [id number] Patient Account Number: [id number] Date of Birth/Sex: Treating RN: 08/14/1950 (72 y.o. [name] Primary Care [name]: [name] Other Clinician: Betha [name] Referring [name]: Treating [name]: [name] in Treatment: 22 Visit Information History Since Last Visit All ordered tests and consults were completed: No Patient Arrived: Wheel Chair Added or deleted any medications: No Arrival Time: 14:52 Any new allergies or adverse reactions: No Transfer Assistance: EasyPivot Patient Lift Had a fall or experienced change in No Patient Identification Verified: Yes activities of daily living that may affect Secondary Verification Process Completed: Yes risk of falls: Patient Requires Transmission-Based Precautions: No Signs or symptoms of abuse/neglect since last visito No Patient Has Alerts: Yes Hospitalized since last visit: No Patient Alerts: Patient on Blood Thinner Implantable device outside of the clinic excluding No ABI L 1.1 02/05/22 cellular tissue based products placed in the center ABI R 1.12 02/05/22 since last visit: Has Dressing in Place as Prescribed: Yes Pain Present Now: No Electronic Signature(s) Signed: 03/08/2023 4:53:23 PM By: [name] Entered By: [name] on 03/08/2023 14:59:03 -------------------------------------------------------------------------------- Clinic Level of Care Assessment Details Patient Name: Date of Service: [name] 03/08/2023 2:45 PM Medical Record Number: [id number] Patient Account Number: [id number] Date of Birth/Sex: Treating RN: 1951-01-13 (72 y.o. [name] Primary Care [name]: [name] Other Clinician: Betha [name] Referring [name]: Treating [name]: [name] Weeks in Treatment: 22 Clinic Level of Care Assessment Items TOOL 1 Quantity Score []  - 0 Use when EandM and Procedure is performed on INITIAL visit ASSESSMENTS - Nursing Assessment / Reassessment []  - 0 General Physical Exam (combine w/ comprehensive assessment (listed just below) when performed on new pt. evals) []  - 0 Comprehensive Assessment (HX, ROS, Risk Assessments, Wounds Hx, etc.) [name] ([id number]) [id number] Page 2 of 10 ASSESSMENTS - Wound and Skin Assessment / Reassessment []  - 0 Dermatologic / Skin Assessment (not related to wound area) ASSESSMENTS - Ostomy and/or Continence Assessment and Care []  - 0 Incontinence Assessment and Management []  - 0 Ostomy Care Assessment and Management (repouching, etc.) PROCESS - Coordination of Care []  - 0 Simple Patient / Family Education for ongoing care []  - 0 Complex (extensive) Patient / Family Education for ongoing care []  - 0 Staff obtains Chiropractor, Records, T Results / Process Orders est []  - 0 Staff telephones HHA, Nursing Homes / Clarify orders / etc []  - 0 Routine Transfer to another Facility (non-emergent condition) []  - 0 Routine Hospital Admission (non-emergent condition) []  - 0 New Admissions / Manufacturing engineer / Ordering NPWT Apligraf, etc. , []  - 0 Emergency Hospital Admission (emergent condition) PROCESS - Special Needs []  - 0 Pediatric / Minor Patient Management []  - 0 Isolation Patient Management []  - 0 Hearing / Language / Visual special needs []  - 0 Assessment of Community assistance (transportation, D/C planning, etc.) []  - 0 Additional assistance / Altered mentation []  - 0 Support Surface(s) Assessment (bed, cushion, seat, etc.) INTERVENTIONS - Miscellaneous []  - 0 External ear exam []  - 0 Patient Transfer (multiple staff / Nurse, adult / Similar devices) []  -  0 Simple Staple / Suture removal (25 or less) []  - 0 Complex Staple / Suture removal (26 or more) []  - 0 Hypo/Hyperglycemic Management (do not check if billed separately) []  -  0 Ankle / Brachial Index (ABI) - do not check if billed separately Has the patient been seen at the hospital within the last three years: Yes Total Score: 0 Level Of Care: ____ Electronic Signature(s) Signed: 03/08/2023 4:53:23 PM By: [name] Entered By: [name] on 03/08/2023 15:41:36 -------------------------------------------------------------------------------- Encounter Discharge Information Details Patient Name: Date of Service: [name], [name] 03/08/2023 2:45 PM Medical Record Number: [id number] Patient Account Number: [id number] Date of Birth/Sex: Treating RN: [birth year] (72 y.o. [name] Primary Care [name]: [name] Other Clinician: Betha [name] Referring [name]: Treating [name]: [name] [name] ([id number]) [id number] Page 3 of 10 Weeks in Treatment: 22 Encounter Discharge Information Items [name] Procedure Vitals Discharge Condition: Stable Temperature (F): 98.2 Ambulatory Status: Wheelchair Pulse (bpm): 64 Discharge Destination: Home Respiratory Rate (breaths/min): 18 Transportation: Private Auto Blood Pressure (mmHg): 143/80 Accompanied By: husband Schedule Follow-up Appointment: Yes Clinical Summary of Care: Electronic Signature(s) Signed: 03/08/2023 4:53:23 PM By: [name] Entered By: [name] on 03/08/2023 15:54:30 -------------------------------------------------------------------------------- Lower Extremity Assessment Details Patient Name: Date of Service: [name] 03/08/2023 2:45 PM Medical Record Number: [id number] Patient Account Number: [id number] Date of Birth/Sex: Treating RN: 1950-10-15 (73 y.o. [name] Primary Care Bernese Doffing: [name] Other  Clinician: Betha [name] Referring [name]: Treating [name]: [name] Weeks in Treatment: 22 Edema Assessment Assessed: [Left: Yes] [Right: No] Edema: [Left: N] [Right: o] Calf Left: Right: Point of Measurement: 33 cm From Medial Instep 32.4 cm Ankle Left: Right: Point of Measurement: 7 cm From Medial Instep 17 cm Knee To Floor Left: Right: From Medial Instep 46 cm Vascular Assessment Pulses: Dorsalis Pedis Palpable: [Left:Yes] Extremity colors, hair growth, and conditions: Extremity Color: [Left:Normal] Hair Growth on Extremity: [Left:Yes] Temperature of Extremity: [Left:Cool < 3 seconds] Electronic Signature(s) Signed: 03/08/2023 3:34:19 PM By: Yevonne Pax RN Signed: 03/08/2023 4:53:23 PM By: [name] Entered By: [name] on 03/08/2023 15:15:56 [name] ([id number]) [id number] Page 4 of 10 -------------------------------------------------------------------------------- Multi Wound Chart Details Patient Name: Date of Service: [name] 03/08/2023 2:45 PM Medical Record Number: [id number] Patient Account Number: [id number] Date of Birth/Sex: Treating RN: 1950-07-08 (72 y.o. [name] Primary Care Berma Harts: [name] Other Clinician: Betha [name] Referring Noelle Sease: Treating Lovene Maret/Extender: [name] Weeks in Treatment: 22 Vital Signs Height(in): 64 Pulse(bpm): 64 Weight(lbs): 250 Blood Pressure(mmHg): 143/80 Body Mass Index(BMI): 42.9 Temperature(F): 98.2 Respiratory Rate(breaths/min): 18 [24:Photos:] [N/A:N/A] Left Calcaneus Right, Lateral Forearm N/A Wound Location: Gradually Appeared Trauma N/A Wounding Event: Pressure Ulcer Skin Tear N/A Primary Etiology: Asthma, Pneumothorax, Arrhythmia, Asthma, Pneumothorax, Arrhythmia, N/A Comorbid History: Hypertension, Type II Diabetes, Gout,Hypertension, Type II Diabetes, Gout, Osteoarthritis,  Neuropathy Osteoarthritis, Neuropathy 08/22/2022 01/24/2023 N/A Date Acquired: 22 5 N/A Weeks of Treatment: Open Open N/A Wound Status: No No N/A Wound Recurrence: 0.2x0.2x0.1 0.1x0.1x0.1 N/A Measurements L x W x D (cm) 0.031 0.008 N/A A (cm) : rea 0.003 0.001 N/A Volume (cm) : 97.40% 100.00% N/A % Reduction in Area: 97.50% 99.90% N/A % Reduction in Volume: Category/Stage III Full Thickness Without Exposed N/A Classification: Support Structures Small Medium N/A Exudate A mount: Serous Serosanguineous N/A Exudate Type: amber red, brown N/A Exudate Color: Distinct, outline attached N/A N/A Wound Margin: Small (1-33%) None Present (0%) N/A Granulation Amount: Red N/A N/A Granulation Quality: Medium (34-66%) Medium (34-66%) N/A Necrotic Amount: Adherent Slough, Necrosis of Bone Eschar, Adherent Slough N/A Necrotic Tissue: Fat Layer (Subcutaneous Tissue): Yes Fat Layer (Subcutaneous  Tissue): Yes N/A Exposed Structures: Fascia: No Tendon: No Muscle: No Joint: No Bone: No None None N/A Epithelialization: Treatment Notes Electronic Signature(s) Signed: 03/08/2023 4:53:23 PM By: Bronson Ing ([id number]) PM By: Jerrell Belfast.pdf Page 5 of 10 Signed: 03/08/2023 4:53:23 Entered By: [name] on 03/08/2023 15:16:02 -------------------------------------------------------------------------------- Multi-Disciplinary Care Plan Details Patient Name: Date of Service: [name] 03/08/2023 2:45 PM Medical Record Number: [id number] Patient Account Number: [id number] Date of Birth/Sex: Treating RN: 08-20-1950 (72 y.o. [name] Primary Care Reve Crocket: [name] Other Clinician: Betha [name] Referring Orren Pietsch: Treating Kassadee Carawan/Extender: [name] Weeks in Treatment: 22 Active Inactive Necrotic Tissue Nursing Diagnoses: Knowledge deficit related to management of necrotic/devitalized  tissue Goals: Necrotic/devitalized tissue will be minimized in the wound bed Date Initiated: 10/01/2022 Target Resolution Date: 11/01/2022 Goal Status: Active Interventions: Assess patient pain level pre-, during and [name] procedure and prior to discharge Treatment Activities: Apply topical anesthetic as ordered : 10/01/2022 Notes: Wound/Skin Impairment Nursing Diagnoses: Knowledge deficit related to ulceration/compromised skin integrity Goals: Patient/caregiver will verbalize understanding of skin care regimen Date Initiated: 10/01/2022 Target Resolution Date: 11/01/2022 Goal Status: Active Ulcer/skin breakdown will have a volume reduction of 30% by week 4 Date Initiated: 10/01/2022 Target Resolution Date: 11/01/2022 Goal Status: Active Ulcer/skin breakdown will have a volume reduction of 50% by week 8 Date Initiated: 10/01/2022 Target Resolution Date: 12/02/2022 Goal Status: Active Ulcer/skin breakdown will have a volume reduction of 80% by week 12 Date Initiated: 10/01/2022 Target Resolution Date: 01/01/2023 Goal Status: Active Ulcer/skin breakdown will heal within 14 weeks Date Initiated: 10/01/2022 Target Resolution Date: 02/01/2023 Goal Status: Active Interventions: Assess patient/caregiver ability to obtain necessary supplies Assess patient/caregiver ability to perform ulcer/skin care regimen upon admission and as needed Assess ulceration(s) every visit Notes: ASHELLY, ZINSMEISTER ([id number]) [phone].pdf Page 6 of 10 Electronic Signature(s) Signed: 03/08/2023 4:53:23 PM By: [name] Signed: 03/09/2023 9:05:13 AM By: Yevonne Pax RN Entered By: [name] on 03/08/2023 15:41:51 -------------------------------------------------------------------------------- Pain Assessment Details Patient Name: Date of Service: [name], [name] 03/08/2023 2:45 PM Medical Record Number: [id number] Patient Account Number: [id number] Date of Birth/Sex:  Treating RN: 06-19-1950 (72 y.o. [name] Primary Care Dafne Nield: [name] Other Clinician: Betha [name] Referring Chia Rock: Treating Brittni Hult/Extender: [name] Weeks in Treatment: 22 Active Problems Location of Pain Severity and Description of Pain Patient Has Paino No Site Locations Pain Management and Medication Current Pain Management: Electronic Signature(s) Signed: 03/08/2023 3:34:19 PM By: Yevonne Pax RN Signed: 03/08/2023 4:53:23 PM By: [name] Entered By: [name] on 03/08/2023 15:04:24 Patient/Caregiver Education Details -------------------------------------------------------------------------------- Leonel Ramsay ([id number]) [phone].pdf Page 7 of 10 Patient Name: Date of Service: [name] 12/16/2024andnbsp2:45 PM Medical Record Number: [id number] Patient Account Number: [id number] Date of Birth/Gender: Treating RN: 1950-06-24 (72 y.o. [name] Primary Care Physician: [name] Other Clinician: Betha [name] Referring Physician: Treating Physician/Extender: [name] in Treatment: 22 Education Assessment Education Provided To: Patient Education Topics Provided Wound/Skin Impairment: Handouts: Other: continue wound care as directed Methods: Explain/Verbal Responses: State content correctly Electronic Signature(s) Signed: 03/08/2023 4:53:23 PM By: [name] Entered By: [name] on 03/08/2023 15:42:26 -------------------------------------------------------------------------------- Wound Assessment Details Patient Name: Date of Service: [name] 03/08/2023 2:45 PM Medical Record Number: [id number] Patient Account Number: [id number] Date of Birth/Sex: Treating RN: 01/11/1951 (72 y.o. [name] Primary Care Jeancarlo Leffler: [name] Other Clinician: Betha [name] Referring Adrienne Trombetta: Treating  Shenelle Klas/Extender: [name] Weeks in Treatment: 22 Wound Status Wound Number: 24 Primary Pressure Ulcer Etiology: Wound Location: Left Calcaneus Wound Open Wounding Event: Gradually Appeared Status: Date Acquired: 08/22/2022 Comorbid Asthma, Pneumothorax, Arrhythmia, Hypertension, Type II Weeks Of Treatment: 22 History: Diabetes, Gout, Osteoarthritis, Neuropathy Clustered Wound: No Photos Wound Measurements Length: (cm) 0.2 Width: (cm) 0.2 Depth: (cm) 0.1 Makin, Zahari [name] ([id number]) Area: (cm) Volume: (cm) % Reduction in Area: 97.4% % Reduction in Volume: 97.5% Epithelialization: None [id number] Page 8 of 10 0.031 0.003 Wound Description Classification: Category/Stage III Wound Margin: Distinct, outline attached Exudate Amount: Small Exudate Type: Serous Exudate Color: amber Foul Odor After Cleansing: No Slough/Fibrino Yes Wound Bed Granulation Amount: Small (1-33%) Exposed Structure Granulation Quality: Red Fascia Exposed: No Necrotic Amount: Medium (34-66%) Fat Layer (Subcutaneous Tissue) Exposed: Yes Necrotic Quality: Adherent Slough, Bone Tendon Exposed: No Muscle Exposed: No Joint Exposed: No Bone Exposed: No Treatment Notes Wound #24 (Calcaneus) Wound Laterality: Left Cleanser Normal Saline Discharge Instruction: Wash your hands with soap and water. Remove old dressing, discard into plastic bag and place into trash. Cleanse the wound with Normal Saline prior to applying a clean dressing using gauze sponges, not tissues or cotton balls. Do not scrub or use excessive force. Pat dry using gauze sponges, not tissue or cotton balls. Soap and Water Discharge Instruction: Gently cleanse wound with antibacterial soap, rinse and pat dry prior to dressing wounds Peri-Wound Care Topical Primary Dressing Promogran Matrix 4.34 (in) Discharge Instruction: Moisten w/normal saline or sterile water; Cover wound as directed. Do not remove from wound  bed. Secondary Dressing Coverlet Latex-Free Fabric Adhesive Dressings Discharge Instruction: 1.5 x 2 Secured With Compression Wrap Compression Stockings Add-Ons Electronic Signature(s) Signed: 03/08/2023 3:34:19 PM By: Yevonne Pax RN Signed: 03/08/2023 4:53:23 PM By: [name] Entered By: [name] on 03/08/2023 15:11:51 -------------------------------------------------------------------------------- Wound Assessment Details Patient Name: Date of Service: [name], [name] 03/08/2023 2:45 PM Medical Record Number: [id number] Patient Account Number: [id number] Date of Birth/Sex: Treating RN: October 16, 1950 (72 y.o. [name] Primary Care Schylar Allard: [name] Other Clinician: Danice, Stuve ([id number]) [id number] Page 9 of 10 Referring Marigold Mom: Treating Netanel Yannuzzi/Extender: [name] in Treatment: 22 Wound Status Wound Number: 25 Primary Skin Tear Etiology: Wound Location: Right, Lateral Forearm Wound Healed - Epithelialized Wounding Event: Trauma Status: Date Acquired: 01/24/2023 Comorbid Asthma, Pneumothorax, Arrhythmia, Hypertension, Type II Weeks Of Treatment: 5 History: Diabetes, Gout, Osteoarthritis, Neuropathy Clustered Wound: No Photos Wound Measurements Length: (cm) Width: (cm) Depth: (cm) Area: (cm) Volume: (cm) 0 % Reduction in Area: 100% 0 % Reduction in Volume: 100% 0 Epithelialization: Large (67-100%) 0 0 Wound Description Classification: Full Thickness Without Exposed Support Exudate Amount: None Present Structures Foul Odor After Cleansing: No Slough/Fibrino No Wound Bed Granulation Amount: None Present (0%) Exposed Structure Necrotic Amount: None Present (0%) Fat Layer (Subcutaneous Tissue) Exposed: Yes Treatment Notes Wound #25 (Forearm) Wound Laterality: Right, Lateral Cleanser Peri-Wound Care Topical Primary Dressing Secondary Dressing Secured  With Compression Wrap Compression Stockings Add-Ons Electronic Signature(s) Signed: 03/08/2023 4:53:23 PM By: [name] Signed: 03/09/2023 9:05:13 AM By: Yevonne Pax RN Previous Signature: 03/08/2023 3:34:19 PM Version By: Yevonne Pax RN Entered By: [name] on 03/08/2023 15:40:11 [name] ([id number]) [id number] Page 10 of 10 -------------------------------------------------------------------------------- Vitals Details Patient Name: Date of Service: [name] 03/08/2023 2:45 PM Medical Record Number: [id number] Patient Account Number: [id number] Date of Birth/Sex: Treating RN: [birth year] (72 y.o. F) Epps, Lyla Son  Primary Care Nikeria Kalman: [name] Other Clinician: Betha [name] Referring Ariella Voit: Treating Jacalynn Buzzell/Extender: [name] in Treatment: 22 Vital Signs Time Taken: 14:59 Temperature (F): 98.2 Height (in): 64 Pulse (bpm): 64 Weight (lbs): 250 Respiratory Rate (breaths/min): 18 Body Mass Index (BMI): 42.9 Blood Pressure (mmHg): 143/80 Reference Range: 80 - 120 mg / dl Electronic Signature(s) Signed: 03/08/2023 4:53:23 PM By: [name] Entered By: [name] on 03/08/2023 15:04:20

## 2023-03-08 NOTE — Progress Notes (Addendum)
[name], [name] ([id number]) [phone].pdf Page 1 of 11 Visit Report for 03/08/2023 Chief Complaint Document Details Patient Name: Date of Service: [name] 03/08/2023 2:45 PM Medical Record Number: [id number] Patient Account Number: [id number] Date of Birth/Sex: Treating RN: 21-Apr-1950 (72 y.o. [name] Primary Care Provider: [name] Other Clinician: Betha Loa Referring Provider: Treating Provider/Extender: Algis Greenhouse Weeks in Treatment: 22 Information Obtained from: Patient Chief Complaint Left heel ulcer and right forearm skin tear Electronic Signature(s) Signed: 03/08/2023 2:47:46 PM By: Allen Derry PA-C Entered By: Allen Derry on 03/08/2023 14:47:46 -------------------------------------------------------------------------------- Debridement Details Patient Name: Date of Service: [name] 03/08/2023 2:45 PM Medical Record Number: [id number] Patient Account Number: [id number] Date of Birth/Sex: Treating RN: 1950-11-25 (72 y.o. [name] Primary Care Provider: [name] Other Clinician: Betha Loa Referring Provider: Treating Provider/Extender: Algis Greenhouse Weeks in Treatment: 22 Debridement Performed for Assessment: Wound #24 Left Calcaneus Performed By: Physician Allen Derry, PA-C The following information was scribed by: Betha Loa The information was scribed for: Allen Derry Debridement Type: Debridement Level of Consciousness (Pre-procedure): Awake and Alert Pre-procedure Verification/Time Out Yes - 15:36 Taken: Start Time: 15:36 Percent of Wound Bed Debrided: 100% T Area Debrided (cm): otal 0.03 Tissue and other material debrided: Viable, Non-Viable, Callus, Slough, Subcutaneous, Slough Level: Skin/Subcutaneous Tissue Debridement Description: Excisional Instrument: Curette Bleeding: Minimum Hemostasis Achieved: Pressure Response to Treatment: Procedure was  tolerated well Level of Consciousness ([address]IL[address]DA, [name] ([id number]) [id number].pdf Page 2 of 11 Level of Consciousness ([name]- Awake and Alert procedure): [name] Debridement Measurements of Total Wound Length: (cm) 0.2 Stage: Category/Stage III Width: (cm) 0.2 Depth: (cm) 0.1 Volume: (cm) 0.003 Character of Wound/Ulcer [name] Debridement: Stable [name] Procedure Diagnosis Same as Pre-procedure Electronic Signature(s) Signed: 03/08/2023 4:20:01 PM By: Allen Derry PA-C Signed: 03/08/2023 4:53:23 PM By: Betha Loa Signed: 03/09/2023 9:05:13 AM By: Yevonne Pax RN Entered By: Betha Loa on 03/08/2023 15:38:24 -------------------------------------------------------------------------------- HPI Details Patient Name: Date of Service: [name], [name]. 03/08/2023 2:45 PM Medical Record Number: [id number] Patient Account Number: [id number] Date of Birth/Sex: Treating RN: 1951/01/30 (72 y.o. [name] Primary Care Provider: [name] Other Clinician: Betha Loa Referring Provider: Treating Provider/Extender: Algis Greenhouse Weeks in Treatment: 22 History of Present Illness HPI Description: 06/28/20 upon evaluation today patient appears to be doing unfortunately somewhat poorly in regard to wounds that she has over her lower extremities. She has a wound on the left distal second toe, left posterior heel, and right anterior lower leg. With that being said all in all none of the wounds appear to be too bad the one that hurts her the most is actually the wound on the posterior heel which actually is also one of the smallest. Nonetheless I think there is some callus hiding what is really going on in this area. Fortunately there does not appear to be any obvious evidence of infection which is great news. Patient does have a history of diabetes mellitus type 2, chronic venous insufficiency, hypertension, history of DVT and is on long-term  anticoagulant s, therapy, Eliquis. 07/05/20 upon evaluation today patient appears to be doing well currently with regard to her wounds. I feel like she is making progress which is great news and overall there is no signs of active infection at this time. No fevers, chills, nausea, vomiting, or diarrhea. 4/29; patient has a only a small area remaining on the tip of her left second toe and  a small area remaining on the left heel. The area on the right anterior lower leg is healed. They tell me that she had remote podiatric surgery on the toes of the left foot however they are very fixed in position and I wonder whether they are making it difficult to relieve pressure in her foot wear. She is a type II diabetic with neuropathy as well. 08/01/2020 upon evaluation today patient appears to be doing well currently in regard to her wounds. In fact everything appears to be doing much better. The posterior aspect of her heel is actually giving her some trouble not the Achilles area but a small wound that we did not even have marked. In fact I had noted this before. Nonetheless we are going to addressed that today. 5/27; most of the patient's wounds are healed including the left Achilles heel left toe. Right forearm is also healed. She has a new abrasion posteriorly in the right elbow area just above the olecranon this happened today with an abrasion from her car door 6/15; the patient's wounds on the left Achilles and left second toe remain healed. The area on the right forearm is also just about healed in fact the skin I replaced last time is looking viable which is good. She had me look at the left heel again which is not in the area of the wound more towards the tip of the heel at the Achilles insertion site as well as the tip of the left foot second toe. In the Achilles area that is clearly is friction probably in bed at night. Not really near where the wound was. The left second toe remains healed although  this is hammered and overhanging first toe also puts pressure in this area 09/13/2020 upon evaluation today patient appears to be doing well with regard to all previous wounds that she had. Everything is completely healed. With that being said I do think that the patient unfortunately has a new skin tear on the right forearm which is due to her put a Band-Aid on it and then when it pulled off this caused some issues with skin tearing. Nonetheless I think that this needs to be addressed today. Hopefully will take too long to get this to heal. She is having some issues with pain in her heel but I think this is more related to be honest to her neuropathy as opposed to anything else. Readmission: 10/30/2020 upon evaluation today patient appears to be doing poorly in regard to her legs bilaterally. She is also having an issue with her fourth toe on the left. Fortunately there does not appear to be any signs of active infection at this time. No fevers, chills, nausea, vomiting, or diarrhea. It is actually been quite a while since we have seen her. In fact June 24 was the last time that she was here in the clinic. She tells me she did not come back because things were just doing "pretty well". 11/29/2020 upon evaluation today patient actually appears to be doing well with regard to her arms those are completely healed. Her right leg is still open but [name], [name] ([id number]) [phone].pdf Page 3 of 11 doing okay. The fourth toe of the left foot does show some signs here of having some issues with necrotic tissue of an open wound. This is where a toenail was removed by podiatry. Subsequently I do think that we need to go ahead and see what we can do about getting this cleared away  so being try to get some healing going forward. 12/13/2020 upon evaluation today patient appears to be doing decently well in regard to her wound on the right leg. In general I think that she is making  good progress here. Unfortunately she continues to have significant issues with swelling in the left leg is now even more swollen at this point. For that reason I do believe she would be benefited by wrapping both sides although she is not interested in doing this. Overall I think that she is definitely making some good progress here nonetheless in regard to the right leg left leg leave some to be desired. 12/27/2020 upon evaluation today patient appears to be doing well with regard to her wound in regard to the legs. Unfortunately the toe was not doing nearly as well. I am much more concerned about infection and osteomyelitis here. She had the MRI but right now were not seeing obvious evidence of redness spreading up to her foot this is good news I do not have the result of the MRI as of yet. I am good to place her on doxycycline however due to the fact that the wound does seem to be getting a little worse in regard to her toe. 01/14/2021 upon evaluation today patient appears to be doing well with regard to her ulcers. The right leg is doing well and even the toe seems to be doing well though she still has some evidence of bone exposed on the toe which does not appear to be doing quite as good as I would like to see just and the fact that is still not covered over new tissue but looks tremendously better compared to 2 weeks ago. Overall very pleased in that regard. 01/28/2021 upon evaluation today patient's leg on the left actually appears to have new wounds this is due to her deciding to shave yesterday. Fortunately there does not appear to be any signs of infection currently. This is good news. Nonetheless I do believe that with these new areas open it would be best to wrap her leg to try to make sure we get this under control sooner rather than later. With regard to her toe there still does appear to be some evidence of bone exposed this was covered over with callus I did remove that today.  Nonetheless I think this is significantly smaller than previous. 02/11/2021 upon evaluation today patient appears to be doing okay in regard to her leg ulcers. I do not see anything obvious at this point this seems to be a complicating factor. I think that she is getting better. Were wrap of the right leg not the left leg. With that being said the left fourth toe ulcer still does have evidence of being open there is some drainage still on top of the wound I think we can to try to see about using something like a collagen dressing today to see if that would be of benefit for her. She is in agreement with giving this a trial. Nonetheless I am concerned about the fact that she continues to have issues here with this not healing it is confirmed chronic osteomyelitis she has been on antibiotics. I am going to extend those today as well. That is Keflex and that will be for 1 additional month. Nonetheless if she is not doing significantly better by the next time I see her I think that the biggest thing is going to be for [place] to go ahead and see what we can  do about getting her started with hyperbaric oxygen therapy to try to save the toe. 02/20/2021 upon evaluation today patient presents for follow-up she actually came in a little bit early as the home health nurse was concerned about her toe becoming "gangrenous". With that being said the good news is there is [place] no evidence of this and in fact the toe seems to be doing better at this point. What was over the toe was an area that had bled significantly and then subsequently dried making it look much worse than where things actually stand. There is just a very small opening remaining in the toe. In regard to her legs everything appears to be healed bilaterally. 02/24/2021 upon evaluation today patient appears to be doing well with regard to her toe ulcer. I am actually very pleased with where we stand and I think that she is making good progress at this time.  There is still a very small opening although I think the biggest issue is the collagen seems to be getting stuck and drying up because the wound is so small we probably just need to use something to keep this moist like a little piece of Xeroform gauze and see if we get this to close. 03/07/2021 upon evaluation patient actually appears to be making excellent progress in regard to her last wound on the toe. There is just a very tiny potential area that I can even be sure at first until I get some of the dry skin off when I did get this removed there was just a very tiny pinpoint opening that was barely even moist. Actually feel like this is very close to complete resolution. She has been using the Xeroform that is doing great. 03/25/2021 upon evaluation today patient appears to be doing well with regard to her toe ulcer. In fact this appears to be potentially completely healed. Fortunately I do not see anything draining at this point which is great news. She does have a small blood blister that is already drying up and looking okay on the third toe as well I am not sure what happened here she has an either. Otherwise she is having some weeping from the right leg this again is newer she tells me from last week weighing in that her pulmonologist and then today weighing in at the hematologist that she gained 30 pounds. This is quite significant if indeed true and I think she is to contact her primary care provider ASAP. This would account for why her right leg is weeping as well. 04/04/2020 upon evaluation today patient appears to be doing excellent in regard to her toe which I actually think is healed today. With that being said she unfortunately is having issues with her leg swelling and again several blisters on each leg that have reopened unfortunately. 05/02/2021 upon evaluation today patient appears to be doing well with regard to her wounds. In fact is down just 1 on the anterior portion of her right leg  which is actually from a blister. Otherwise she is doing awesome. 05/15/2021 upon evaluation today patient appears to be doing better in regard to her leg ulcer although she has a small irritated area from tape on the leg medially. She also has a skin tear to her right elbow region. Fortunately there does not appear to be any signs of active infection locally nor systemically at this point which is great news. No fevers, chills, nausea, vomiting, or diarrhea. 05/26/2021 upon evaluation today patient appears to  be doing better in regard to the actual wounds although she is having a lot of leaking from the right leg. She is also been using her lymphedema pumps along with wearing her compression. Nonetheless she is having a lot of trouble with her breathing I am concerned that the pumps may be contributing to this. 06/12/2021 upon evaluation today patient appears to be doing better in regard to a lot of things including her breathing as well as her legs. She still has a lot of swelling but the good news is this is better she lost about 20 pounds of fluid when she went to the hospital and they diuresed her. Subsequently this is good news. Nonetheless she actually told me that "if she ever gets that bad again for me to yell at her to go to the hospital". She said that I was not quite forceful enough and telling her what she needed to do. She also notes she will try to be more receptive in the future. 06/19/2021 upon evaluation today patient appears to be doing well with regard to her wound. She is tolerating the dressing changes without complication. Fortunately her legs on the right are completely healed. She did have an area that she hit her Achilles on the left but this too seems to be pretty much closed at this point. Readmission: 02-05-2022 upon evaluation today patient appears to be doing well currently in regard to her left leg although her right leg is actually giving her quite a bit more trouble at  this point. Fortunately there does not appear to be any signs of infection locally or systemically which is great news. No fevers, chills, nausea, vomiting, or diarrhea. Patient tells me that she began having issues more recently with her legs in the right leg is definitely worse than the left as far as the way she feels as well and she states it was around September 16 that she first noted this and then when she called it took about 2-1/2 weeks to get in to see me. Her past medical history really has not changed since I saw her back in March everything is pretty much about the same repeat ABIs appear to be doing well currently at 1.1 on the left and 1.12 on the right. 02-10-2022 upon evaluation today patient actually appears to be doing well. Culture actually showed evidence of Pseudomonas but her legs look amazing today I do not see any signs of infection I do not believe that we need to add anything especially in light of the fact there is a lot of interactions with anything to treat the Pseudomonas in her current medication regimen. For that reason we will just hold with where we stand and continue with the current therapy. 02-24-2022 upon evaluation today patient appears to be doing excellent in fact she appears to be completely healed based on what we are seeing. Readmission: AERIN, LAVANWAY ([id number]) [phone].pdf Page 4 of 11 10-01-2022 upon evaluation today patient presents for follow-up evaluation here in the clinic she has not been seen since December 2023. At that point she was actually being seen for her legs that is actually doing much better which is great news. Overall very pleased with her leg situation and she has been doing great with her compression socks. With that being said unfortunately she does seem to be having issues with her heel this was secondary to having her left shoulder replacement and subsequent to that she was in the hospital for 6 days  during that time she developed a pressure wound on the left heel. Unfortunately that is now it is giving her the most trouble she also has some irritation of the right heel but is not nearly as significant as far as that is concerned there is no signs of active infection at this time which is great news and very pleased in that regard. 10-09-2022 upon evaluation today patient appears to be doing well currently in regard to her heel we are definitely seeing signs of improvement which is great news and in general I think that we are moving in the right direction. I do not see any evidence of worsening overall which is great news. 7/25; pressure ulcer at the tip of her heel she has been using Promogran and sit to foot with a border foam. She is a type II diabetic. She is attempting to religiously offload this area with Prevalon boots at night open heeled shoes during the day. Her measurements are a lot better today per our intake nurse 11-03-2022 upon evaluation today patient actually has not been there for 3 weeks is a combination of a couple missed appointments on her end and last week she had to cancel because of weather. Either way her foot seems to be doing well in regard to the heel on the left lateral area. This is not showing signs of any obvious infection and at this point I think that she is making headway towards getting this closed although I do believe there is quite a bit of slough and biofilm skin need to be cleaned off just a little bit of callus around the edges of the wound. 11-10-2022 upon evaluation today patient appears to be doing well currently in regard to her heel ulcer. She has been tolerating the dressing changes without complication and in general I feel like she is making excellent headway towards complete closure. 8/27; patient has a wound on the left heel near the tip. Using Promogran and Zituvimet. Comes in with open heeled shoes however it has a lip that encompasses the wound  area which is on the distal part of the Achilles area. 12-08-2022 upon evaluation today patient appears to be doing well currently in regard to her wound on the foot which is actually doing excellent. This heel is measuring about 60% smaller than last time I saw her. Fortunately I think that she is really doing quite well at this time. 01-11-2023 upon evaluation today patient appears to be doing well currently in regard to her wound. She has been tolerating the dressing changes without complication. With that being said her heel actually seems to be doing well. She is inquiring as to whether or not she can do her 2-week follow-up. 01-26-2023 upon evaluation today patient appears to be doing well currently in regard to her heel ulcer I am pleased in that regard. Unfortunately she has a skin tear of the right lateral forearm which occurred as a result of a fall Sunday night. She tells me that she has been having issues here since that time with bruising and discomfort she is on Eliquis so the bruising is not unexpected. With that being said I do believe that she is going to require something to keep this from sticking to the wound and allowing it to continue to heal I think that skin to be appropriate I discussed with her options today as well. 03-08-2023 upon evaluation today patient appears to be doing well currently in regard to her heel and her  arm. The arm appears to be completely healed which is great news that he will is significantly improved with getting very close to complete resolution. There is some need for sharp debridement today. Electronic Signature(s) Signed: 03/08/2023 3:53:55 PM By: Allen Derry PA-C Entered By: Allen Derry on 03/08/2023 15:53:55 -------------------------------------------------------------------------------- Physical Exam Details Patient Name: Date of Service: [name] 03/08/2023 2:45 PM Medical Record Number: [id number] Patient Account Number:  [id number] Date of Birth/Sex: Treating RN: 05-03-1950 (72 y.o. [name] Primary Care Provider: [name] Other Clinician: Betha Loa Referring Provider: Treating Provider/Extender: Algis Greenhouse Weeks in Treatment: 22 Constitutional Well-nourished and well-hydrated in no acute distress. Respiratory normal breathing without difficulty. Psychiatric this patient is able to make decisions and demonstrates good insight into disease process. Alert and Oriented x 3. pleasant and cooperative. Notes Upon inspection patient's wound bed actually showed signs of good granulation epithelization at this point. Fortunately I do not see any signs of worsening or infection at this time and I do believe that the patient is making headway here towards closure. Electronic Signature(s) Signed: 03/08/2023 3:54:10 PM By: Rochel Brome, [place] [name] ([id number]) PM By: Allen Derry PA-C [phone].pdf Page 5 of 11 Signed: 03/08/2023 3:54:10 Entered By: Allen Derry on 03/08/2023 15:54:10 -------------------------------------------------------------------------------- Physician Orders Details Patient Name: Date of Service: [name] 03/08/2023 2:45 PM Medical Record Number: [id number] Patient Account Number: [id number] Date of Birth/Sex: Treating RN: [birth year] (72 y.o. [name] Primary Care Provider: [name] Other Clinician: Betha Loa Referring Provider: Treating Provider/Extender: Algis Greenhouse Weeks in Treatment: 22 The following information was scribed by: Betha Loa The information was scribed for: Allen Derry Verbal / Phone Orders: No Diagnosis Coding ICD-10 Coding Code Description E11.621 Type 2 diabetes mellitus with foot ulcer I87.323 Chronic venous hypertension (idiopathic) with inflammation of bilateral lower extremity L89.623 Pressure ulcer of left heel, stage 3 S51.811A Laceration without  foreign body of right forearm, initial encounter L89.616 Pressure-induced deep tissue damage of right heel Z79.01 Long term (current) use of anticoagulants I48.0 Paroxysmal atrial fibrillation Follow-up Appointments Return Appointment in 3 weeks. Home Health [facility] for wound care. May utilize formulary equivalent dressing for wound treatment orders unless otherwise specified. Home Health Nurse may visit PRN to address patients wound care needs. Rolene Arbour for wound care to left calcan [phone] Anesthetic (Use 'Patient Medications' Section for Anesthetic Order Entry) Lidocaine applied to wound bed Wound Treatment Wound #24 - Calcaneus Wound Laterality: Left Cleanser: Normal Saline Every Other Day/30 Days Discharge Instructions: Wash your hands with soap and water. Remove old dressing, discard into plastic bag and place into trash. Cleanse the wound with Normal Saline prior to applying a clean dressing using gauze sponges, not tissues or cotton balls. Do not scrub or use excessive force. Pat dry using gauze sponges, not tissue or cotton balls. Cleanser: Soap and Water Every Other Day/30 Days Discharge Instructions: Gently cleanse wound with antibacterial soap, rinse and pat dry prior to dressing wounds Prim Dressing: Promogran Matrix 4.34 (in) (Dispense As Written) Every Other Day/30 Days ary Discharge Instructions: Moisten w/normal saline or sterile water; Cover wound as directed. Do not remove from wound bed. Secondary Dressing: Coverlet Latex-Free Fabric Adhesive Dressings Every Other Day/30 Days Discharge Instructions: 1.5 x 2 Electronic Signature(s) Signed: 03/08/2023 4:20:01 PM By: Allen Derry PA-C Signed: 03/08/2023 4:53:23 PM By: Betha Loa Entered By: Betha Loa on 03/08/2023 15:41:23 [name] ([id number]) [id number] Page 6 of  11 -------------------------------------------------------------------------------- Problem  List Details Patient Name: Date of Service: [name] 03/08/2023 2:45 PM Medical Record Number: [id number] Patient Account Number: [id number] Date of Birth/Sex: Treating RN: 12/23/1950 (72 y.o. [name] Primary Care Provider: [name] Other Clinician: Betha Loa Referring Provider: Treating Provider/Extender: Algis Greenhouse Weeks in Treatment: 22 Active Problems ICD-10 Encounter Code Description Active Date MDM Diagnosis E11.621 Type 2 diabetes mellitus with foot ulcer 10/01/2022 No Yes I87.323 Chronic venous hypertension (idiopathic) with inflammation of bilateral lower 10/01/2022 No Yes extremity L89.623 Pressure ulcer of left heel, stage 3 10/01/2022 No Yes S51.811A Laceration without foreign body of right forearm, initial encounter 01/26/2023 No Yes L89.616 Pressure-induced deep tissue damage of right heel 10/01/2022 No Yes Z79.01 Long term (current) use of anticoagulants 10/01/2022 No Yes I48.0 Paroxysmal atrial fibrillation 10/01/2022 No Yes Inactive Problems Resolved Problems Electronic Signature(s) Signed: 03/08/2023 2:47:43 PM By: Allen Derry PA-C Entered By: Allen Derry on 03/08/2023 14:47:43 [name] ([id number]) [id number] Page 7 of 11 -------------------------------------------------------------------------------- Progress Note Details Patient Name: Date of Service: [name] 03/08/2023 2:45 PM Medical Record Number: [id number] Patient Account Number: [id number] Date of Birth/Sex: Treating RN: 05/25/1950 (72 y.o. [name] Primary Care Provider: [name] Other Clinician: Betha Loa Referring Provider: Treating Provider/Extender: Algis Greenhouse Weeks in Treatment: 22 Subjective Chief Complaint Information obtained from Patient Left heel ulcer and right forearm skin tear History of Present Illness (HPI) 06/28/20 upon evaluation today patient appears to be doing  unfortunately somewhat poorly in regard to wounds that she has over her lower extremities. She has a wound on the left distal second toe, left posterior heel, and right anterior lower leg. With that being said all in all none of the wounds appear to be too bad the one that hurts her the most is actually the wound on the posterior heel which actually is also one of the smallest. Nonetheless I think there is some callus hiding what is really going on in this area. Fortunately there does not appear to be any obvious evidence of infection which is great news. Patient does have a history of diabetes mellitus type 2, chronic venous insufficiency, hypertension, history of DVT and is on long-term anticoagulant therapy, Eliquis. s, 07/05/20 upon evaluation today patient appears to be doing well currently with regard to her wounds. I feel like she is making progress which is great news and overall there is no signs of active infection at this time. No fevers, chills, nausea, vomiting, or diarrhea. 4/29; patient has a only a small area remaining on the tip of her left second toe and a small area remaining on the left heel. The area on the right anterior lower leg is healed. They tell me that she had remote podiatric surgery on the toes of the left foot however they are very fixed in position and I wonder whether they are making it difficult to relieve pressure in her foot wear. She is a type II diabetic with neuropathy as well. 08/01/2020 upon evaluation today patient appears to be doing well currently in regard to her wounds. In fact everything appears to be doing much better. The posterior aspect of her heel is actually giving her some trouble not the Achilles area but a small wound that we did not even have marked. In fact I had noted this before. Nonetheless we are going to addressed that today. 5/27; most of the patient's wounds are healed including the left  Achilles heel left toe. Right forearm is also  healed. She has a new abrasion posteriorly in the right elbow area just above the olecranon this happened today with an abrasion from her car door 6/15; the patient's wounds on the left Achilles and left second toe remain healed. The area on the right forearm is also just about healed in fact the skin I replaced last time is looking viable which is good. She had me look at the left heel again which is not in the area of the wound more towards the tip of the heel at the Achilles insertion site as well as the tip of the left foot second toe. In the Achilles area that is clearly is friction probably in bed at night. Not really near where the wound was. The left second toe remains healed although this is hammered and overhanging first toe also puts pressure in this area 09/13/2020 upon evaluation today patient appears to be doing well with regard to all previous wounds that she had. Everything is completely healed. With that being said I do think that the patient unfortunately has a new skin tear on the right forearm which is due to her put a Band-Aid on it and then when it pulled off this caused some issues with skin tearing. Nonetheless I think that this needs to be addressed today. Hopefully will take too long to get this to heal. She is having some issues with pain in her heel but I think this is more related to be honest to her neuropathy as opposed to anything else. Readmission: 10/30/2020 upon evaluation today patient appears to be doing poorly in regard to her legs bilaterally. She is also having an issue with her fourth toe on the left. Fortunately there does not appear to be any signs of active infection at this time. No fevers, chills, nausea, vomiting, or diarrhea. It is actually been quite a while since we have seen her. In fact June 24 was the last time that she was here in the clinic. She tells me she did not come back because things were just doing "pretty well". 11/29/2020 upon evaluation  today patient actually appears to be doing well with regard to her arms those are completely healed. Her right leg is still open but doing okay. The fourth toe of the left foot does show some signs here of having some issues with necrotic tissue of an open wound. This is where a toenail was removed by podiatry. Subsequently I do think that we need to go ahead and see what we can do about getting this cleared away so being try to get some healing going forward. 12/13/2020 upon evaluation today patient appears to be doing decently well in regard to her wound on the right leg. In general I think that she is making good progress here. Unfortunately she continues to have significant issues with swelling in the left leg is now even more swollen at this point. For that reason I do believe she would be benefited by wrapping both sides although she is not interested in doing this. Overall I think that she is definitely making some good progress here nonetheless in regard to the right leg left leg leave some to be desired. 12/27/2020 upon evaluation today patient appears to be doing well with regard to her wound in regard to the legs. Unfortunately the toe was not doing nearly as well. I am much more concerned about infection and osteomyelitis here. She had the MRI but  right now were not seeing obvious evidence of redness spreading up to her foot this is good news I do not have the result of the MRI as of yet. I am good to place her on doxycycline however due to the fact that the wound does seem to be getting a little worse in regard to her toe. 01/14/2021 upon evaluation today patient appears to be doing well with regard to her ulcers. The right leg is doing well and even the toe seems to be doing well though she still has some evidence of bone exposed on the toe which does not appear to be doing quite as good as I would like to see just and the fact that is still not covered over new tissue but looks  tremendously better compared to 2 weeks ago. Overall very pleased in that regard. 01/28/2021 upon evaluation today patient's leg on the left actually appears to have new wounds this is due to her deciding to shave yesterday. Fortunately there does not appear to be any signs of infection currently. This is good news. Nonetheless I do believe that with these new areas open it would be best to wrap her leg to try to make sure we get this under control sooner rather than later. With regard to her toe there still does appear to be some evidence of bone exposed this was covered over with callus I did remove that today. Nonetheless I think this is significantly smaller than previous. [name], [name] ([id number]) [phone].pdf Page 8 of 11 02/11/2021 upon evaluation today patient appears to be doing okay in regard to her leg ulcers. I do not see anything obvious at this point this seems to be a complicating factor. I think that she is getting better. Were wrap of the right leg not the left leg. With that being said the left fourth toe ulcer still does have evidence of being open there is some drainage still on top of the wound I think we can to try to see about using something like a collagen dressing today to see if that would be of benefit for her. She is in agreement with giving this a trial. Nonetheless I am concerned about the fact that she continues to have issues here with this not healing it is confirmed chronic osteomyelitis she has been on antibiotics. I am going to extend those today as well. That is Keflex and that will be for 1 additional month. Nonetheless if she is not doing significantly better by the next time I see her I think that the biggest thing is going to be for [place] to go ahead and see what we can do about getting her started with hyperbaric oxygen therapy to try to save the toe. 02/20/2021 upon evaluation today patient presents for follow-up she actually came in a  little bit early as the home health nurse was concerned about her toe becoming "gangrenous". With that being said the good news is there is [place] no evidence of this and in fact the toe seems to be doing better at this point. What was over the toe was an area that had bled significantly and then subsequently dried making it look much worse than where things actually stand. There is just a very small opening remaining in the toe. In regard to her legs everything appears to be healed bilaterally. 02/24/2021 upon evaluation today patient appears to be doing well with regard to her toe ulcer. I am actually very pleased with where we stand  and I think that she is making good progress at this time. There is still a very small opening although I think the biggest issue is the collagen seems to be getting stuck and drying up because the wound is so small we probably just need to use something to keep this moist like a little piece of Xeroform gauze and see if we get this to close. 03/07/2021 upon evaluation patient actually appears to be making excellent progress in regard to her last wound on the toe. There is just a very tiny potential area that I can even be sure at first until I get some of the dry skin off when I did get this removed there was just a very tiny pinpoint opening that was barely even moist. Actually feel like this is very close to complete resolution. She has been using the Xeroform that is doing great. 03/25/2021 upon evaluation today patient appears to be doing well with regard to her toe ulcer. In fact this appears to be potentially completely healed. Fortunately I do not see anything draining at this point which is great news. She does have a small blood blister that is already drying up and looking okay on the third toe as well I am not sure what happened here she has an either. Otherwise she is having some weeping from the right leg this again is newer she tells me from last week  weighing in that her pulmonologist and then today weighing in at the hematologist that she gained 30 pounds. This is quite significant if indeed true and I think she is to contact her primary care provider ASAP. This would account for why her right leg is weeping as well. 04/04/2020 upon evaluation today patient appears to be doing excellent in regard to her toe which I actually think is healed today. With that being said she unfortunately is having issues with her leg swelling and again several blisters on each leg that have reopened unfortunately. 05/02/2021 upon evaluation today patient appears to be doing well with regard to her wounds. In fact is down just 1 on the anterior portion of her right leg which is actually from a blister. Otherwise she is doing awesome. 05/15/2021 upon evaluation today patient appears to be doing better in regard to her leg ulcer although she has a small irritated area from tape on the leg medially. She also has a skin tear to her right elbow region. Fortunately there does not appear to be any signs of active infection locally nor systemically at this point which is great news. No fevers, chills, nausea, vomiting, or diarrhea. 05/26/2021 upon evaluation today patient appears to be doing better in regard to the actual wounds although she is having a lot of leaking from the right leg. She is also been using her lymphedema pumps along with wearing her compression. Nonetheless she is having a lot of trouble with her breathing I am concerned that the pumps may be contributing to this. 06/12/2021 upon evaluation today patient appears to be doing better in regard to a lot of things including her breathing as well as her legs. She still has a lot of swelling but the good news is this is better she lost about 20 pounds of fluid when she went to the hospital and they diuresed her. Subsequently this is good news. Nonetheless she actually told me that "if she ever gets that bad again for  me to yell at her to go to the hospital". She said  that I was not quite forceful enough and telling her what she needed to do. She also notes she will try to be more receptive in the future. 06/19/2021 upon evaluation today patient appears to be doing well with regard to her wound. She is tolerating the dressing changes without complication. Fortunately her legs on the right are completely healed. She did have an area that she hit her Achilles on the left but this too seems to be pretty much closed at this point. Readmission: 02-05-2022 upon evaluation today patient appears to be doing well currently in regard to her left leg although her right leg is actually giving her quite a bit more trouble at this point. Fortunately there does not appear to be any signs of infection locally or systemically which is great news. No fevers, chills, nausea, vomiting, or diarrhea. Patient tells me that she began having issues more recently with her legs in the right leg is definitely worse than the left as far as the way she feels as well and she states it was around September 16 that she first noted this and then when she called it took about 2-1/2 weeks to get in to see me. Her past medical history really has not changed since I saw her back in March everything is pretty much about the same repeat ABIs appear to be doing well currently at 1.1 on the left and 1.12 on the right. 02-10-2022 upon evaluation today patient actually appears to be doing well. Culture actually showed evidence of Pseudomonas but her legs look amazing today I do not see any signs of infection I do not believe that we need to add anything especially in light of the fact there is a lot of interactions with anything to treat the Pseudomonas in her current medication regimen. For that reason we will just hold with where we stand and continue with the current therapy. 02-24-2022 upon evaluation today patient appears to be doing excellent in fact  she appears to be completely healed based on what we are seeing. Readmission: 10-01-2022 upon evaluation today patient presents for follow-up evaluation here in the clinic she has not been seen since December 2023. At that point she was actually being seen for her legs that is actually doing much better which is great news. Overall very pleased with her leg situation and she has been doing great with her compression socks. With that being said unfortunately she does seem to be having issues with her heel this was secondary to having her left shoulder replacement and subsequent to that she was in the hospital for 6 days during that time she developed a pressure wound on the left heel. Unfortunately that is now it is giving her the most trouble she also has some irritation of the right heel but is not nearly as significant as far as that is concerned there is no signs of active infection at this time which is great news and very pleased in that regard. 10-09-2022 upon evaluation today patient appears to be doing well currently in regard to her heel we are definitely seeing signs of improvement which is great news and in general I think that we are moving in the right direction. I do not see any evidence of worsening overall which is great news. 7/25; pressure ulcer at the tip of her heel she has been using Promogran and sit to foot with a border foam. She is a type II diabetic. She is attempting to religiously offload this area  with Prevalon boots at night open heeled shoes during the day. Her measurements are a lot better today per our intake nurse 11-03-2022 upon evaluation today patient actually has not been there for 3 weeks is a combination of a couple missed appointments on her end and last week she had to cancel because of weather. Either way her foot seems to be doing well in regard to the heel on the left lateral area. This is not showing signs of any obvious infection and at this point I think  that she is making headway towards getting this closed although I do believe there is quite a bit of slough and biofilm skin need to be cleaned off just a little bit of callus around the edges of the wound. 11-10-2022 upon evaluation today patient appears to be doing well currently in regard to her heel ulcer. She has been tolerating the dressing changes without complication and in general I feel like she is making excellent headway towards complete closure. 8/27; patient has a wound on the left heel near the tip. Using Promogran and Zituvimet. Comes in with open heeled shoes however it has a lip that [name], [name] ([id number]) [id number] Page 9 of 11 encompasses the wound area which is on the distal part of the Achilles area. 12-08-2022 upon evaluation today patient appears to be doing well currently in regard to her wound on the foot which is actually doing excellent. This heel is measuring about 60% smaller than last time I saw her. Fortunately I think that she is really doing quite well at this time. 01-11-2023 upon evaluation today patient appears to be doing well currently in regard to her wound. She has been tolerating the dressing changes without complication. With that being said her heel actually seems to be doing well. She is inquiring as to whether or not she can do her 2-week follow-up. 01-26-2023 upon evaluation today patient appears to be doing well currently in regard to her heel ulcer I am pleased in that regard. Unfortunately she has a skin tear of the right lateral forearm which occurred as a result of a fall Sunday night. She tells me that she has been having issues here since that time with bruising and discomfort she is on Eliquis so the bruising is not unexpected. With that being said I do believe that she is going to require something to keep this from sticking to the wound and allowing it to continue to heal I think that skin to be appropriate I  discussed with her options today as well. 03-08-2023 upon evaluation today patient appears to be doing well currently in regard to her heel and her arm. The arm appears to be completely healed which is great news that he will is significantly improved with getting very close to complete resolution. There is some need for sharp debridement today. Objective Constitutional Well-nourished and well-hydrated in no acute distress. Vitals Time Taken: 2:59 PM, Height: 64 in, Weight: 250 lbs, BMI: 42.9, Temperature: 98.2 F, Pulse: 64 bpm, Respiratory Rate: 18 breaths/min, Blood Pressure: 143/80 mmHg. Respiratory normal breathing without difficulty. Psychiatric this patient is able to make decisions and demonstrates good insight into disease process. Alert and Oriented x 3. pleasant and cooperative. General Notes: Upon inspection patient's wound bed actually showed signs of good granulation epithelization at this point. Fortunately I do not see any signs of worsening or infection at this time and I do believe that the patient is making headway here towards closure. Integumentary (Hair,  Skin) Wound #24 status is Open. Original cause of wound was Gradually Appeared. The date acquired was: 08/22/2022. The wound has been in treatment 22 weeks. The wound is located on the Left Calcaneus. The wound measures 0.2cm length x 0.2cm width x 0.1cm depth; 0.031cm^2 area and 0.003cm^3 volume. There is Fat Layer (Subcutaneous Tissue) exposed. There is a small amount of serous drainage noted. The wound margin is distinct with the outline attached to the wound base. There is small (1-33%) red granulation within the wound bed. There is a medium (34-66%) amount of necrotic tissue within the wound bed including Adherent Slough and Necrosis of Bone. Wound #25 status is Healed - Epithelialized. Original cause of wound was Trauma. The date acquired was: 01/24/2023. The wound has been in treatment 5 weeks. The wound is located on  the Right,Lateral Forearm. The wound measures 0cm length x 0cm width x 0cm depth; 0cm^2 area and 0cm^3 volume. There is Fat Layer (Subcutaneous Tissue) exposed. There is a none present amount of drainage noted. There is no granulation within the wound bed. There is no necrotic tissue within the wound bed. Assessment Active Problems ICD-10 Type 2 diabetes mellitus with foot ulcer Chronic venous hypertension (idiopathic) with inflammation of bilateral lower extremity Pressure ulcer of left heel, stage 3 Laceration without foreign body of right forearm, initial encounter Pressure-induced deep tissue damage of right heel Long term (current) use of anticoagulants Paroxysmal atrial fibrillation Procedures Wound #24 Pre-procedure diagnosis of Wound #24 is a Pressure Ulcer located on the Left Calcaneus . There was a Excisional Skin/Subcutaneous Tissue Debridement with a total area of 0.03 sq cm performed by Allen Derry, PA-C. With the following instrument(s): Curette to remove Viable and Non-Viable tissue/material. Material removed includes Callus, Subcutaneous Tissue, and Slough. A time out was conducted at 15:36, prior to the start of the procedure. A Minimum amount of bleeding was controlled with Pressure. The procedure was tolerated well. [name] Debridement Measurements: 0.2cm length x 0.2cm width x 0.1cm depth; 0.003cm^3 volume. [name] debridement Stage noted as Category/Stage III. Character of Wound/Ulcer [name] Debridement is stable. [name] procedure Diagnosis Wound #24: Same as Pre-Procedure [name], [name] ([id number]) [id number] Page 10 of 11 Plan Follow-up Appointments: Return Appointment in 3 weeks. Home Health: [facility] for wound care. May utilize formulary equivalent dressing for wound treatment orders unless otherwise specified. Home Health Nurse may visit PRN to address patients wound care needs. Rolene Arbour for wound care to left calcan  [phone] Anesthetic (Use 'Patient Medications' Section for Anesthetic Order Entry): Lidocaine applied to wound bed WOUND #24: - Calcaneus Wound Laterality: Left Cleanser: Normal Saline Every Other Day/30 Days Discharge Instructions: Wash your hands with soap and water. Remove old dressing, discard into plastic bag and place into trash. Cleanse the wound with Normal Saline prior to applying a clean dressing using gauze sponges, not tissues or cotton balls. Do not scrub or use excessive force. Pat dry using gauze sponges, not tissue or cotton balls. Cleanser: Soap and Water Every Other Day/30 Days Discharge Instructions: Gently cleanse wound with antibacterial soap, rinse and pat dry prior to dressing wounds Prim Dressing: Promogran Matrix 4.34 (in) (Dispense As Written) Every Other Day/30 Days ary Discharge Instructions: Moisten w/normal saline or sterile water; Cover wound as directed. Do not remove from wound bed. Secondary Dressing: Coverlet Latex-Free Fabric Adhesive Dressings Every Other Day/30 Days Discharge Instructions: 1.5 x 2 1. I am going to recommend that we have the patient going continue to monitor for any signs  of infection or worsening. Based on what I see I do believe that we are making really good headway here towards closure. 2. Also can recommend the patient should continue with the Promogran followed by the coverlet. 3. In regard to the arm no further treatment is necessary everything looks as if it is doing really well. We will see patient back for reevaluation in 1 week here in the clinic. If anything worsens or changes patient will contact our office for additional recommendations. Electronic Signature(s) Signed: 03/08/2023 3:54:35 PM By: Allen Derry PA-C Entered By: Allen Derry on 03/08/2023 15:54:34 -------------------------------------------------------------------------------- SuperBill Details Patient Name: Date of Service: [name], [place]  03/08/2023 Medical Record Number: [id number] Patient Account Number: [id number] Date of Birth/Sex: Treating RN: March 01, 1951 (72 y.o. [name] Primary Care Provider: [name] Other Clinician: Betha Loa Referring Provider: Treating Provider/Extender: Algis Greenhouse Weeks in Treatment: 22 Diagnosis Coding ICD-10 Codes Code Description E11.621 Type 2 diabetes mellitus with foot ulcer I87.323 Chronic venous hypertension (idiopathic) with inflammation of bilateral lower extremity L89.623 Pressure ulcer of left heel, stage 3 S51.811A Laceration without foreign body of right forearm, initial encounter L89.616 Pressure-induced deep tissue damage of right heel Z79.01 Long term (current) use of anticoagulants I48.0 Paroxysmal atrial fibrillation Facility Procedures : AIRYANA, HARGUS CodeELIZABELLE TROPEANO ([id number]) [id number] ICD L Description: [id number] 42 - DEB SUBQ TISSUE 20 SQ CM/< -10 Diagnosis Description 89.623 Pressure ulcer of left heel, stage 3 Modifier: [id number] Page 11 of 1 Quantity: 11 Physician Procedures : CPT4 Code Description Modifier 11042 11042 - WC PHYS SUBQ TISS 20 SQ CM ICD-10 Diagnosis Description L89.623 Pressure ulcer of left heel, stage 3 Quantity: 1 Electronic Signature(s) Signed: 03/08/2023 3:54:44 PM By: Allen Derry PA-C Entered By: Allen Derry on 03/08/2023 15:54:43

## 2023-03-08 NOTE — Telephone Encounter (Signed)
Please review

## 2023-03-11 DIAGNOSIS — R9082 White matter disease, unspecified: Secondary | ICD-10-CM | POA: Diagnosis not present

## 2023-03-11 DIAGNOSIS — R531 Weakness: Secondary | ICD-10-CM | POA: Diagnosis not present

## 2023-03-11 DIAGNOSIS — Z8673 Personal history of transient ischemic attack (TIA), and cerebral infarction without residual deficits: Secondary | ICD-10-CM | POA: Diagnosis not present

## 2023-03-11 DIAGNOSIS — Z9181 History of falling: Secondary | ICD-10-CM | POA: Diagnosis not present

## 2023-03-11 DIAGNOSIS — G253 Myoclonus: Secondary | ICD-10-CM | POA: Diagnosis not present

## 2023-03-15 IMAGING — MR MR FOOT*L* WO/W CM
9 series · 40 of 40 positions shown · IV contrast (gadavist)
Comparison: X-ray 10/31/2020

CLINICAL DATA: Pain, redness, swelling of the fourth toe. History
of nail removal

EXAM:
MRI OF THE LEFT FOREFOOT WITHOUT AND WITH CONTRAST
TECHNIQUE: Multiplanar, multisequence MR imaging of the left forefoot was
performed both before and after administration of intravenous
contrast.
CONTRAST:  10mL GADAVIST GADOBUTROL 1 MMOL/ML IV SOLN

[Series 4: T1 · coronal · left · 3.0mm · 0.38mm/px · 5 of 45 slices shown (1 of 2)]
[im 1/45]
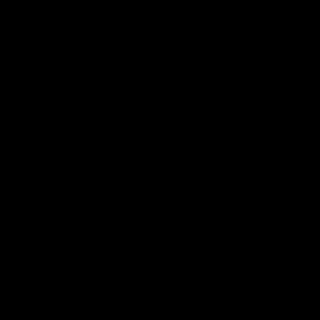
[im 12/45]
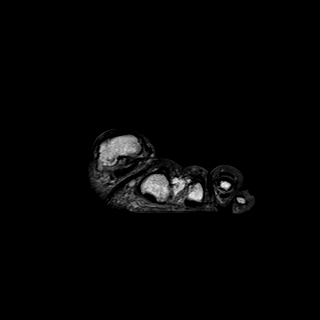
[im 23/45]
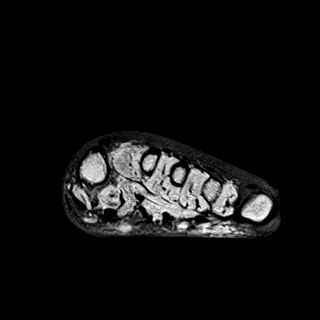
[im 34/45]
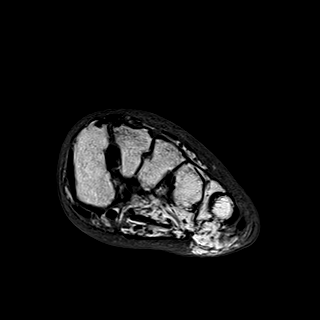
[im 45/45]
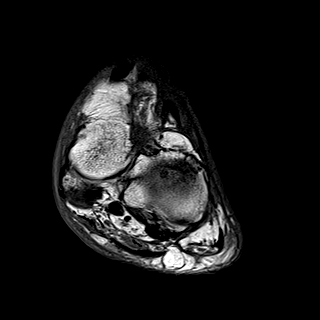

[Series 6: T2 · coronal · left · 3.0mm · 0.50mm/px · 6 of 45 slices shown (1 of 2)]
[im 1/45]
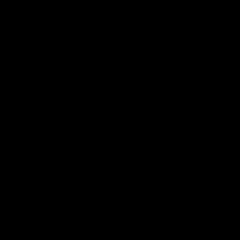
[im 9/45]
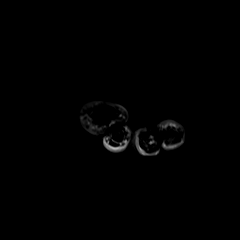
[im 18/45]
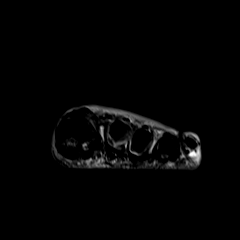
[im 27/45]
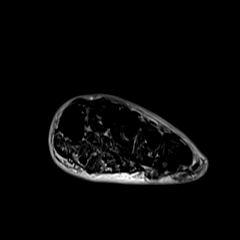
[im 36/45]
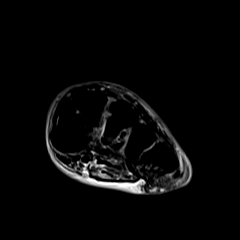
[im 45/45]
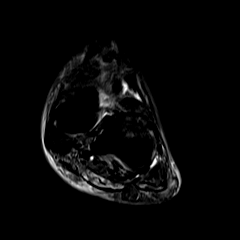

[Series 7: T1 · axial · left · 3.0mm · 0.70mm/px · z∈[-96,-26]mm · 3 of 20 slices shown (2 of 2)]
[im 1/20]
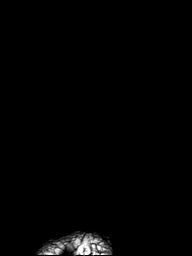
[im 10/20]
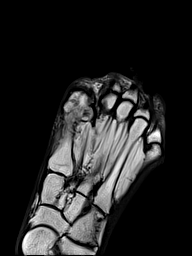
[im 20/20]
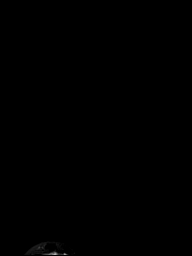

[Series 9: T2 · axial · left · 3.0mm · 0.70mm/px · z∈[-96,-26]mm · 3 of 20 slices shown (2 of 2)]
[im 1/20]
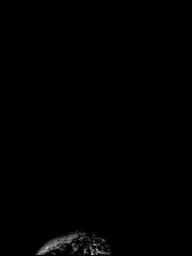
[im 10/20]
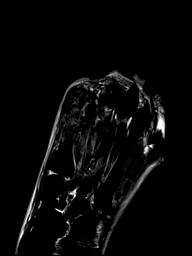
[im 20/20]
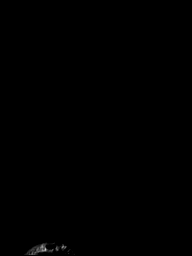

[Series 10: STIR · sagittal · left · 3.0mm · 0.62mm/px · 4 of 27 slices shown]
[im 1/27]
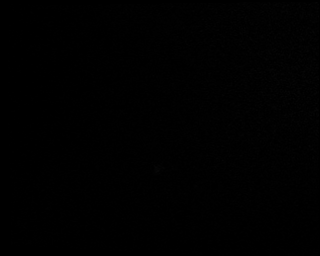
[im 9/27]
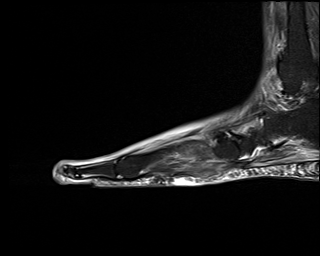
[im 18/27]
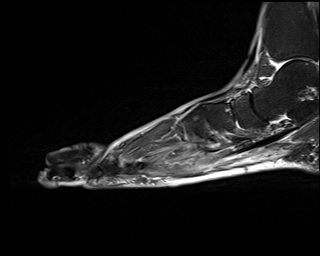
[im 27/27]
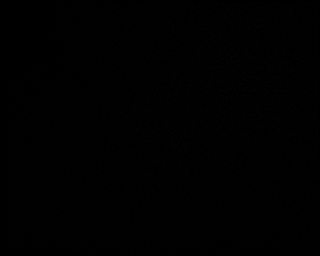

[Series 11: T1 fat-sat · coronal · non-contrast · left · 3.0mm · 0.47mm/px · 6 of 45 slices shown (1 of 3)]
[im 1/45]
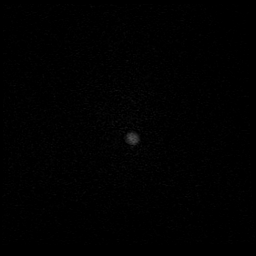
[im 9/45]
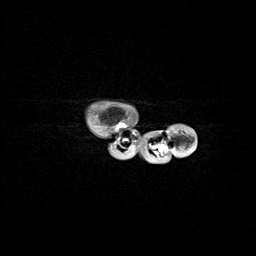
[im 18/45]
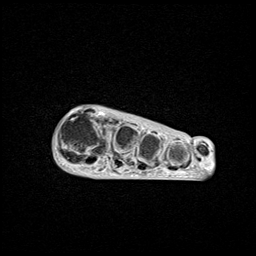
[im 27/45]
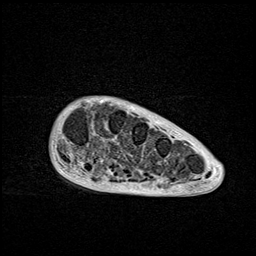
[im 36/45]
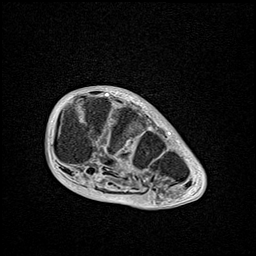
[im 45/45]
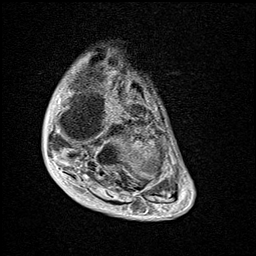

[Series 12: T1 fat-sat post-contrast · coronal · left · 3.0mm · 0.47mm/px · 6 of 45 slices shown]
[im 1/45]
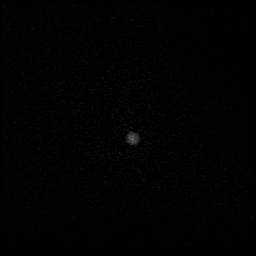
[im 9/45]
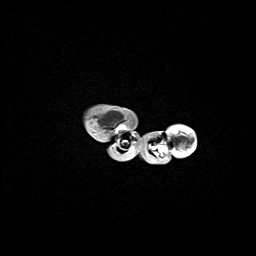
[im 18/45]
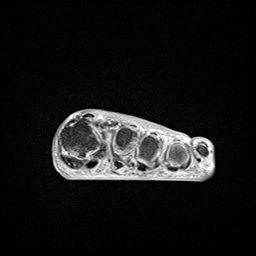
[im 27/45]
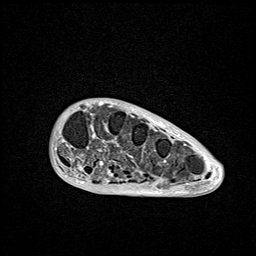
[im 36/45]
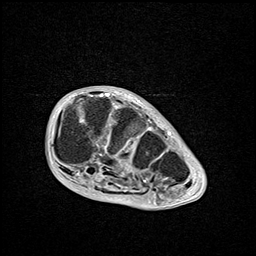
[im 45/45]
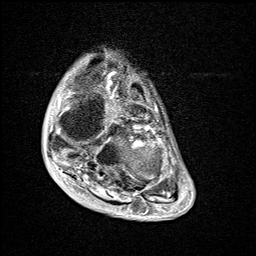

[Series 13: T1 fat-sat · sagittal · left · 3.0mm · 0.62mm/px · 4 of 27 slices shown (2 of 3)]
[im 1/27]
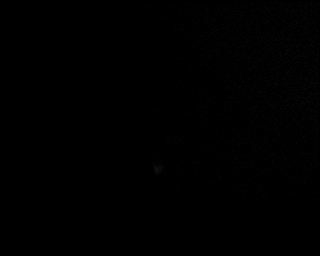
[im 9/27]
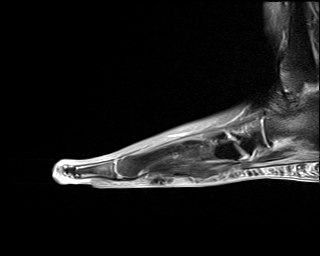
[im 18/27]
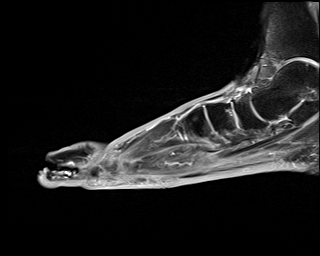
[im 27/27]
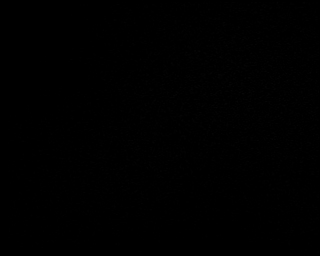

[Series 14: T1 fat-sat · axial · left · 3.0mm · 0.56mm/px · z∈[-96,-26]mm · 3 of 20 slices shown (3 of 3)]
[im 1/20]
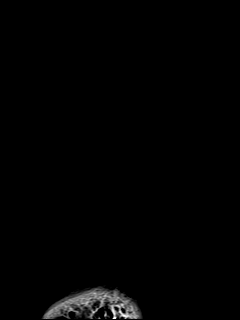
[im 10/20]
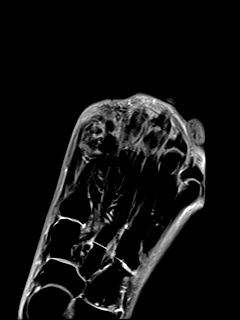
[im 20/20]
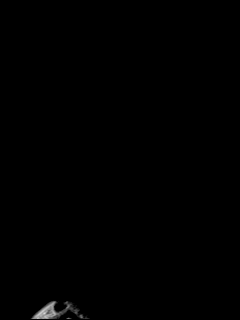

[40 of 40 positions shown; findings below may reference images not displayed]

FINDINGS: Bones/Joint/Cartilage

There is bone marrow edema and enhancement within the distal phalanx
of the left fourth toe. Slightly less hyperintense compared to
adjacent marrow structures, although there are no confluent low T1
signal changes. No erosion or bone destruction.

No acute fracture. No dislocation. Degenerative changes throughout
the foot are most pronounced at the first MTP joint. Great toe
crossover deformity. No joint effusions. No erosion. Scattered foci
of susceptibility related to prior surgical changes.

Ligaments

Intact Lisfranc ligament. No evidence of acute ligamentous
abnormality.

Muscles and Tendons

Complete fatty atrophy of the intrinsic foot musculature compatible
with chronic denervation. No intramuscular fluid collection. Flexor
and extensor tendons are intact. No tenosynovitis.

Soft tissues

Mild soft tissue edema with enhancement of the fourth toe suggesting
cellulitis. No organized fluid collection. No discrete ulcerations.
IMPRESSION: 1. Bone marrow signal changes involving the distal phalanx of the
left fourth toe are most suggestive of reactive osteitis. No
definite features of acute osteomyelitis are evident at this time.
2. Soft tissue edema and enhancement of the fourth toe suggesting
cellulitis. No organized fluid collection.

## 2023-03-18 DIAGNOSIS — I10 Essential (primary) hypertension: Secondary | ICD-10-CM | POA: Diagnosis not present

## 2023-03-18 DIAGNOSIS — Z8673 Personal history of transient ischemic attack (TIA), and cerebral infarction without residual deficits: Secondary | ICD-10-CM | POA: Diagnosis not present

## 2023-03-18 DIAGNOSIS — G4733 Obstructive sleep apnea (adult) (pediatric): Secondary | ICD-10-CM | POA: Diagnosis not present

## 2023-03-18 DIAGNOSIS — G4700 Insomnia, unspecified: Secondary | ICD-10-CM | POA: Diagnosis not present

## 2023-03-19 ENCOUNTER — Other Ambulatory Visit: Payer: Medicare HMO

## 2023-03-19 ENCOUNTER — Ambulatory Visit: Payer: Medicare HMO | Admitting: Oncology

## 2023-03-26 ENCOUNTER — Inpatient Hospital Stay: Payer: Medicare HMO | Admitting: Nurse Practitioner

## 2023-03-26 ENCOUNTER — Telehealth: Payer: Self-pay | Admitting: Oncology

## 2023-03-26 ENCOUNTER — Inpatient Hospital Stay: Payer: Medicare HMO

## 2023-03-26 NOTE — Telephone Encounter (Signed)
 Called patient to check on appointment for today- she stated she left a voicemail earlier today to reschedule and no one has called her back. Appointments rescheduled and patient confirmed

## 2023-03-29 ENCOUNTER — Ambulatory Visit: Payer: Medicare HMO | Admitting: Physician Assistant

## 2023-04-01 ENCOUNTER — Encounter: Payer: Self-pay | Admitting: Nurse Practitioner

## 2023-04-01 ENCOUNTER — Inpatient Hospital Stay: Payer: Medicare HMO | Attending: Oncology

## 2023-04-01 ENCOUNTER — Inpatient Hospital Stay (HOSPITAL_BASED_OUTPATIENT_CLINIC_OR_DEPARTMENT_OTHER): Payer: Medicare HMO | Admitting: Nurse Practitioner

## 2023-04-01 VITALS — BP 120/71 | HR 75 | Temp 98.2°F | Wt 245.0 lb

## 2023-04-01 DIAGNOSIS — Z79899 Other long term (current) drug therapy: Secondary | ICD-10-CM | POA: Insufficient documentation

## 2023-04-01 DIAGNOSIS — D509 Iron deficiency anemia, unspecified: Secondary | ICD-10-CM

## 2023-04-01 LAB — CBC
HCT: 42.9 % (ref 36.0–46.0)
Hemoglobin: 13.6 g/dL (ref 12.0–15.0)
MCH: 28.9 pg (ref 26.0–34.0)
MCHC: 31.7 g/dL (ref 30.0–36.0)
MCV: 91.1 fL (ref 80.0–100.0)
Platelets: 200 10*3/uL (ref 150–400)
RBC: 4.71 MIL/uL (ref 3.87–5.11)
RDW: 14.7 % (ref 11.5–15.5)
WBC: 4.6 10*3/uL (ref 4.0–10.5)
nRBC: 0 % (ref 0.0–0.2)

## 2023-04-01 LAB — IRON AND TIBC
Iron: 82 ug/dL (ref 28–170)
Saturation Ratios: 22 % (ref 10.4–31.8)
TIBC: 379 ug/dL (ref 250–450)
UIBC: 297 ug/dL

## 2023-04-01 LAB — FERRITIN: Ferritin: 39 ng/mL (ref 11–307)

## 2023-04-01 NOTE — Progress Notes (Signed)
 Hematology/Oncology Consult Note [facility]  [phone] [phone]  Patient Care Team: Gretel App, NP as PCP - General (Nurse Practitioner) Darron [name] LABOR, MD as PCP - Cardiology (Cardiology) Tanya Glisson, MD as Consulting Physician (Pain Medicine) Anner Alm ORN, MD as Consulting Physician (Cardiology) Poggi, Norleen PARAS, MD as Consulting Physician (Surgery) Melanee Annah BROCKS, MD as Consulting Physician (Oncology) Tobie Priest, MD as Consulting Physician (Orthopedic Surgery)   Name of the patient: Tanashia Ciesla  [id number]  1950/07/04   Date of visit: 04/01/23  Diagnosis-iron  deficiency anemia  Chief complaint/ Reason for visit- routine follow-up of iron  deficiency anemia  Heme/Onc history: Patient is a 73 year old female with a history of sleep apnea, bronchitis, diabetes CVA who has been referred for anemia.  Most recent CBC from June 2022 showed white cell count of 7.5, H&H of 11.1/34.5 with an MCV of 80.9 and a platelet count of 257.  Looking back at her prior CBCs her hemoglobin has been mostly between 9.5-10.5.  B12 levels in June was greater than 1550.  Folate was normal iron  studies showed a low ferritin of 18 and iron  saturation of 11%.  Patient currently reports fatigue which has been more than usual along with exertional shortness of breath.  Denies any blood loss in her stool or urine.  Denies any dark melanotic stools.  Last EGD was in September 2021 by Dr. Aundria which showed possible candidiasis, benign-appearing esophageal stenosis and multiple gastric polyps which were negative for malignancy.  Last colonoscopy was in March 2019 which showed diverticulosis and polyps but no evidence of active bleeding.   Results of blood workroutine follow-up of anemia from 03/25/2021 were as follows: CBC showed white cell count of 7.6, H&H of 8.6/29.6 with an MCV of 78 and a platelet count of 239.  Ferritin levels were low at 6.  B12 level  normal at 563.  No evidence of hemolysis.  Myeloma panel showed no evidence of M protein.  TSH was mildly low at 0.14.  Patient received IV iron  Venofer  in February 2023.    Interval history-patient feels well overall. Denies any neurologic complaints. Denies recent fevers or illnesses. Denies any easy bleeding or bruising. No melena or hematochezia. No pica or restless leg. Reports good appetite and denies weight loss. Denies chest pain. Denies any nausea, vomiting, constipation, or diarrhea. Denies urinary complaints. Patient offers no further specific complaints today. She continues to be followed by wound care.   ECOG PS- 2 Pain scale- 0   Review of systems- Review of Systems  Constitutional:  Negative for chills, fever, malaise/fatigue and weight loss.  HENT:  Negative for congestion, ear discharge and nosebleeds.   Eyes:  Negative for blurred vision.  Respiratory:  Negative for cough, hemoptysis, sputum production, shortness of breath and wheezing.   Cardiovascular:  Negative for chest pain, palpitations, orthopnea and claudication.  Gastrointestinal:  Negative for abdominal pain, blood in stool, constipation, diarrhea, heartburn, melena, nausea and vomiting.  Genitourinary:  Negative for dysuria, flank pain, frequency, hematuria and urgency.  Musculoskeletal:  Negative for back pain, joint pain and myalgias.  Skin:  Negative for rash.  Neurological:  Negative for dizziness, tingling, focal weakness, seizures, weakness and headaches.  Endo/Heme/Allergies:  Does not bruise/bleed easily.  Psychiatric/Behavioral:  Negative for depression and suicidal ideas. The patient does not have insomnia.      Allergies  Allergen Reactions   Morphine  And Codeine Other (See Comments) and Nausea Only    Patient becomes very  confused Other reaction(s): Other (See Comments), Other (See Comments) Patient becomes very confused Patient becomes very confused    Penicillins Hives, Shortness Of Breath,  Swelling and Other (See Comments)    Tolerates Keflex  and Ceftriaxone    Ambien  [Zolpidem ]     Hallucination    Aspirin  Hives   Oxytetracycline Hives    Other reaction(s): Unknown     Past Medical History:  Diagnosis Date   Anemia    Anxiety    a.) on BZO (alprazolam ) PRN   Arthritis    Asthma    Atypical chest pain    a. 03/2018 MV: No ischemia/infarct. EF >65%.   Carotid arterial disease (HCC)    a. 03/2020 U/S: RICA <50, LICA nl.   Chronic heart failure with preserved ejection fraction (HFpEF) (HCC)    a. 08/2013 Echo: EF 60-65%; b. 08/2019 Echo: EF 60-65%; c. 03/2020 Echo: EF 60-65%; d. 05/2021 Echo: EF 55-60%, no rmwa, nl RV fxn, RVSP 54.66mmHg. Mildly dil LA, mild MR, mod TR.   Chronic, continuous use of opioids    a.) has naloxone  Rx available   CKD (chronic kidney disease), stage III (HCC)    Complication of anesthesia    a.) RIGHT hemidiaphragm paralysis s/p interscalene block for shoulder arthroplasty in 2017 --> caused permanent lung damage per patient --> REFUSES further neuraxial anesthesia   Cystocele    Deep vein thrombosis (DVT) of right lower extremity (HCC)    Depression    Diaphragm paralysis 2018   a.) RIGHT hemidiaphragm paralysis s/p interscalene block for shoulder arthroplasty in 2017; refuses additional regional/neuraxial anesthesia   DM (diabetes mellitus) type II, controlled, with peripheral vascular disorder (HCC) 08/31/2014   Dyspnea    11/08/2018 - more now due to lack of exercise   Edema    Fatty liver    GERD (gastroesophageal reflux disease)    Gout    Heart murmur    Hemiparesis affecting left side as late effect of cerebrovascular accident (HCC)    Hiatal hernia    a.) s/p repair   History of IBS    History of methicillin resistant staphylococcus aureus (MRSA) 2008   History of multiple strokes    Hyperlipidemia    Hypertension    Hypothyroidism    Insomnia    Long term current use of anticoagulant    a.) apixaban    Mitral  regurgitation    a. 05/2021 Echo: Mild MR.   Nephrolithiasis    Neuropathy involving both lower extremities    Non-healing ulcer of left foot (HCC)    Obesity, Class II, BMI 35-39.9, with comorbidity    OSA on CPAP    PAF (paroxysmal atrial fibrillation) (HCC)    a.) 07/2013 Event monitor: Freq PVCs and 3 short runs of Afib; b.) CHA2DS2VASc = 8 (age, sex, CHF, HTN, CVA/DVT x2, vascular disease history, T2DM);  b.) rate/rhythm maintained without pharmacological intervention; chronically anticoagulated with apixaban    PAH (pulmonary artery hypertension) (HCC)    a. 05/2021 Echo: RVSP 54.35mmHg.   Peripheral vascular disease (HCC)    a. 03/2018 LE Angio: Nl renal arteries. Nl aorta & iliacs. Nl LLE arterial flow; b. 06/2020 ABI's: Nl bilat.   T2DM (type 2 diabetes mellitus) (HCC)    Tricuspid regurgitation    a. 05/2021 Echo: Mod TR (in setting of RVSP 54.27mmHg).   Umbilical hernia    a.) s/p repair   Urinary incontinence    Venous stasis dermatitis of both lower extremities  Past Surgical History:  Procedure Laterality Date   ANKLE SURGERY Right    APPLICATION OF WOUND VAC Left 06/27/2015   Procedure: APPLICATION OF WOUND VAC ( POSSIBLE ) ;  Surgeon: Selinda GORMAN Gu, MD;  Location: [facility] ORS;  Service: Vascular;  Laterality: Left;   APPLICATION OF WOUND VAC Left 09/25/2016   Procedure: APPLICATION OF WOUND VAC;  Surgeon: Lilli Cough, DPM;  Location: [facility] ORS;  Service: Podiatry;  Laterality: Left;   arm surgery     right     CHOLECYSTECTOMY     COLONOSCOPY     COLONOSCOPY WITH PROPOFOL  N/A 01/11/2017   Procedure: COLONOSCOPY WITH PROPOFOL ;  Surgeon: Gaylyn Gladis PENNER, MD;  Location: [facility] ENDOSCOPY;  Service: Endoscopy;  Laterality: N/A;   COLONOSCOPY WITH PROPOFOL  N/A 02/22/2017   Procedure: COLONOSCOPY WITH PROPOFOL ;  Surgeon: Gaylyn Gladis PENNER, MD;  Location: [facility] ENDOSCOPY;  Service: Endoscopy;  Laterality: N/A;   COLONOSCOPY WITH PROPOFOL  N/A 05/31/2017   Procedure:  COLONOSCOPY WITH PROPOFOL ;  Surgeon: Gaylyn Gladis PENNER, MD;  Location[facility] END[facility]COPY;  Service: Endoscopy;  Laterality: N/A;   COLONOSCOPY WITH PROPOFOL  N/A 01/26/2022   Procedure: COLONOSCOPY WITH PROPOFOL ;  Surgeon: Maryruth Ole DASEN, MD;  Location: [facility] ENDOSCOPY;  Service: Endoscopy;  Laterality: N/A;   ESOPHAGOGASTRODUODENOSCOPY (EGD) WITH PROPOFOL  N/A 01/11/2017   Procedure: ESOPHAGOGASTRODUODENOSCOPY (EGD) WITH PROPOFOL ;  Surgeon: Gaylyn Gladis PENNER, MD;  Location: [facility] ENDOSCOPY;  Service: Endoscopy;  Laterality: N/A;   ESOPHAGOGASTRODUODENOSCOPY (EGD) WITH PROPOFOL  N/A 10/25/2018   Procedure: ESOPHAGOGASTRODUODENOSCOPY (EGD) WITH PROPOFOL ;  Surgeon: Gaylyn Gladis PENNER, MD;  Location: [facility] ENDOSCOPY;  Service: Endoscopy;  Laterality: N/A;   ESOPHAGOGASTRODUODENOSCOPY (EGD) WITH PROPOFOL  N/A 11/29/2019   Procedure: ESOPHAGOGASTRODUODENOSCOPY (EGD) WITH PROPOFOL ;  Surgeon: Toledo, Ladell POUR, MD;  Location: [facility] ENDOSCOPY;  Service: Gastroenterology;  Laterality: N/A;   ESOPHAGOGASTRODUODENOSCOPY (EGD) WITH PROPOFOL  N/A 01/26/2022   Procedure: ESOPHAGOGASTRODUODENOSCOPY (EGD) WITH PROPOFOL ;  Surgeon: Maryruth Ole DASEN, MD;  Location: [facility] ENDOSCOPY;  Service: Endoscopy;  Laterality: N/A;  DM   HIATAL HERNIA REPAIR     I & D EXTREMITY Left 06/27/2015   Procedure: IRRIGATION AND DEBRIDEMENT EXTREMITY            ( CALF HEMATOMA ) POSSIBLE WOUND VAC;  Surgeon: Selinda GORMAN Gu, MD;  Location: [facility] ORS;  Service: Vascular;  Laterality: Left;   INCISION AND DRAINAGE OF WOUND Left 09/25/2016   Procedure: IRRIGATION AND DEBRIDEMENT - PARTIAL RESECTION OF ACHILLES TENDON WITH WOUND VAC APPLICATION;  Surgeon: Lilli Cough, DPM;  Location: [facility] OR[facility];  Service: Podiatry;  Laterality: Left;   INCISION AND DRAINAGE OF WOUND Left 11/09/2018   Procedure: Excision of left foot wound with ACell placement;  Surgeon: Lowery Estefana GORMAN, DO;  Location: [facility] OR;  Service: Plastics;  Laterality: Left;    IRRIGATION AND DEBRIDEMENT ABSCESS Left 09/07/2016   Procedure: IRRIGATION AND DEBRIDEMENT ABSCESS LEFT HEEL;  Surgeon: Lilli Cough, DPM;  Location: [facility] ORS;  Service: Podiatry;  Laterality: Left;   KIDNEY STONE SURGERY Right    LITHOTRIPSY     LOWER EXTREMITY ANGIOGRAPHY Left 04/04/2018   Procedure: LOWER EXTREMITY ANGIOGRAPHY;  Surgeon: Gu Selinda GORMAN, MD;  Location: [facility] INVASIVE CV LAB;  Service: Cardiovascular;  Laterality: Left;   NM GATED MYOVIEW  ([facility] HX)  04/2015   Likely breast attenuation. LOW RISK study. Normal EF 55-60%.   OTHER SURGICAL HISTORY     08/2016 or 09/2016 surgery on achilles tenso h/o staph infection heal. Dr. Lilli   removal of left hematoma Left  leg   REVERSE SHOULDER ARTHROPLASTY Right 10/08/2015   Procedure: REVERSE SHOULDER ARTHROPLASTY;  Surgeon: Norleen JINNY Maltos, MD;  Location: [facility] ORS;  Service: Orthopedics;  Laterality: Right;   REVERSE SHOULDER ARTHROPLASTY Left 09/01/2022   Procedure: REVERSE SHOULDER ARTHROPLASTY;  Surgeon: Tobie Priest, MD;  Location: [facility] ORS;  Service: Orthopedics;  Laterality: Left;   SLEEVE GASTROPLASTY     TONSILLECTOMY     TOTAL KNEE ARTHROPLASTY Left    TOTAL KNEE ARTHROPLASTY  2013   TOTAL SHOULDER REPLACEMENT Right    TRANSESOPHAGEAL ECHOCARDIOGRAM  08/08/2013   Mild LVH, EF 60-65%. Moderate LA dilation and mild RA dilation. Mild MR with no evidence of stenosis and no evidence of endocarditis. A false chordae is noted.   TRANSTHORACIC ECHOCARDIOGRAM  08/03/2013   Mild-Moderate concentric LVH, EF 60-65%. Normal diastolic function. Mild LA dilation. Mild MR with possible vegetation  - not confirmed on TEE    ULNAR NERVE TRANSPOSITION Right 08/06/2016   Procedure: ULNAR NERVE DECOMPRESSION/TRANSPOSITION;  Surgeon: Maltos Norleen JINNY, MD;  Location: [facility] ORS;  Service: Orthopedics;  Laterality: Right;   UMBILICAL HERNIA REPAIR     UPPER GI ENDOSCOPY     VAGINAL HYSTERECTOMY      Social History   Socioeconomic History    Marital status: Married    Spouse name: Not on file   Number of children: Not on file   Years of education: Not on file   Highest education level: Not on file  Occupational History   Not on file  Tobacco Use   Smoking status: Never   Smokeless tobacco: Never  Vaping Use   Vaping status: Never Used  Substance and Sexual Activity   Alcohol  use: No    Alcohol /week: 0.0 standard drinks of alcohol    Drug use: Never   Sexual activity: Not Currently  Other Topics Concern   Not on file  Social History Narrative   She is currently married -- for 30+ years. Does not work. Does not smoke or take alcohol . She never smoked. She exercises at least 3 days a week since before her gastric surgery.   Marital status reviewed in history of present illness.   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    Social Drivers of Corporate Investment Banker Strain: Low Risk  (02/24/2023)   Overall Financial Resource Strain (CARDIA)    Difficulty of Paying Living Expenses: Not hard at all  Food Insecurity: No Food Insecurity (02/24/2023)   Hunger Vital Sign    Worried About Running Out of Food in the Last Year: Never true    Ran Out of Food in the Last Year: Never true  Transportation Needs: No Transportation Needs (02/24/2023)   PRAPARE - Administrator, Civil Service (Medical): No    Lack of Transportation (Non-Medical): No  Physical Activity: Inactive (02/24/2023)   Exercise Vital Sign    Days of Exercise per Week: 0 days    Minutes of Exercise per Session: 0 min  Stress: No Stress Concern Present (02/24/2023)   [employer] of Occupational Health - Occupational Stress Questionnaire    Feeling of Stress : Only a little  Social Connections: Moderately Isolated (02/24/2023)   Social Connection and Isolation Panel [NHANES]    Frequency of Communication with Friends and Family: More than three times a week    Frequency of Social  Gatherings with Friends and Family: Once a week  Attends Religious Services: Never    Active Member of Clubs or Organizations: No    Attends Banker Meetings: Never    Marital Status: Married  Catering Manager Violence: Not At Risk (02/24/2023)   Humiliation, Afraid, Rape, and Kick questionnaire    Fear of Current or Ex-Partner: No    Emotionally Abused: No    Physically Abused: No    Sexually Abused: No    Family History  Problem Relation Age of Onset   Skin cancer Father    Diabetes Father    Hypertension Father    Peripheral vascular disease Father    Cancer Father    Cerebral aneurysm Father    Alcohol  abuse Father    Varicose Veins Mother    Kidney disease Mother    Arthritis Mother    Mental illness Sister    Cancer Maternal Aunt        breast   Breast cancer Maternal Aunt    Arthritis Maternal Grandmother    Hypertension Maternal Grandmother    Diabetes Maternal Grandmother    Arthritis Maternal Grandfather    Heart disease Maternal Grandfather    Hypertension Maternal Grandfather    Arthritis Paternal Grandmother    Hypertension Paternal Grandmother    Diabetes Paternal Grandmother    Arthritis Paternal Grandfather    Hypertension Paternal Grandfather    Bladder Cancer Neg Hx    Kidney cancer Neg Hx      Current Outpatient Medications:    acetaminophen  (TYLENOL ) 500 MG tablet, Take 2 tablets (1,000 mg total) by mouth every 8 (eight) hours., Disp: 30 tablet, Rfl: 0   albuterol  (VENTOLIN  HFA) 108 (90 Base) MCG/ACT inhaler, Inhale 2 puffs into the lungs every 6 (six) hours as needed., Disp: 8 g, Rfl: 4   ALPRAZolam  (XANAX ) 0.25 MG tablet, Take 1 tablet (0.25 mg total) by mouth 2 (two) times daily as needed for anxiety. See SIG change was taking 1/2 of 0.5 tablet bid prn changed to 0.25 bid prn. D/c 0.5 xanax , Disp: 60 tablet, Rfl: 2   buPROPion  (WELLBUTRIN  XL) 150 MG 24 hr tablet, Take 1 tablet (150 mg total) by mouth daily., Disp: 90 tablet, Rfl:  3   calcium -vitamin D  (OSCAL WITH D) 500-200 MG-UNIT tablet, Take 1 tablet by mouth 2 (two) times daily with a meal., Disp: 60 tablet, Rfl: 0   cephALEXin  (KEFLEX ) 250 MG capsule, Take 1 capsule (250 mg total) by mouth daily., Disp: 90 capsule, Rfl: 1   clobetasol  cream (TEMOVATE ) 0.05 %, Apply 1 application topically 2 (two) times daily as needed (skin irritation). , Disp: , Rfl:    colchicine  0.6 MG tablet, Take 1 tablet (0.6 mg total) by mouth daily. Repeat I 1 hour if needed. No more than 3 tablets in 24 hours PRN for GOUT FLARE, Disp: 30 tablet, Rfl: 0   Continuous Glucose Sensor (FREESTYLE LIBRE 3 PLUS SENSOR) MISC, PLACE ONE SENSOR ON THE SKIN EVERY FOURTEEN DAYS.USE TO CHECK GLUCOSE CONTINUOUSLY, Disp: 6 each, Rfl: 3   cyanocobalamin  (VITAMIN B12) 1000 MCG tablet, Take 1,000 mcg by mouth daily., Disp: , Rfl:    diclofenac  Sodium (VOLTAREN ) 1 % GEL, Apply 2 g topically 3 (three) times daily., Disp: 100 g, Rfl: 2   DULoxetine  (CYMBALTA ) 60 MG capsule, Take 1 capsule (60 mg total) by mouth daily., Disp: 90 capsule, Rfl: 3   ELIQUIS  5 MG TABS tablet, TAKE 1 TABLET BY MOUTH TWICE DAILY, Disp: 180 tablet, Rfl: 1   empagliflozin  (JARDIANCE )  25 MG TABS tablet, Take 1 tablet (25 mg total) by mouth daily before breakfast., Disp: 90 tablet, Rfl: 3   ergocalciferol  (VITAMIN D2) 1.25 MG (50000 UT) capsule, Take 50,000 Units by mouth once a week., Disp: , Rfl:    estradiol  (ESTRACE ) 0.1 MG/GM vaginal cream, Apply 0.5mg  (pea-sized amount)  just inside the vaginal introitus with a finger-tip on Monday, Wednesday and Friday nights,, Disp: 42.5 g, Rfl: 11   ferrous sulfate  324 MG TBEC, Take 324 mg by mouth daily with breakfast., Disp: , Rfl:    fluconazole  (DIFLUCAN ) 150 MG tablet, Take 1 tablet by mouth x 1 dose. May repeat in 72 hours if needed., Disp: 3 tablet, Rfl: 1   Fluticasone -Umeclidin-Vilant (TRELEGY ELLIPTA ) 200-62.5-25 MCG/ACT AEPB, INHALE ONE PUFF INTO THE LUNGS EVERY DAY, Disp: 60 each, Rfl: 5    folic acid  (FOLVITE ) 800 MCG tablet, Take 800 mcg by mouth daily., Disp: , Rfl:    furosemide  (LASIX ) 40 MG tablet, Take 1 tablet (40 mg total) by mouth as directed. Take 1 tablet daily. May take additional 1 tablet If weight gain of greater than 2 pounds in 24 hours or 5 pounds in one week., Disp: 90 tablet, Rfl: 3   glucose blood (ONETOUCH ULTRA) test strip, TEST TWICE DAILY AS DIRECTED, Disp: 100 each, Rfl: 11   ipratropium-albuterol  (DUONEB) 0.5-2.5 (3) MG/3ML SOLN, Take 3 mLs by nebulization every 6 (six) hours as needed., Disp: , Rfl:    Lancets (ONETOUCH ULTRASOFT) lancets, Use as instructed, Disp: 100 each, Rfl: 12   levocetirizine (XYZAL ) 5 MG tablet, TAKE ONE TABLET BY MOUTH AT BEDTIME AS NEEDED FOR ALLERGIES, Disp: 90 tablet, Rfl: 3   levothyroxine  (SYNTHROID ) 137 MCG tablet, Take 1 tablet (137 mcg total) by mouth daily before breakfast., Disp: 90 tablet, Rfl: 3   Loperamide -Simethicone  2-125 MG TABS, Take 1 tablet by mouth daily as needed. May repeat in 1 hour, Disp: , Rfl:    losartan  (COZAAR ) 25 MG tablet, Take 1 tablet (25 mg total) by mouth daily., Disp: 90 tablet, Rfl: 3   Magnesium  ([employer]S TRIPLE MAGNESIUM  COMPLEX) 400 MG CAPS, Take 1 capsule by mouth daily., Disp: , Rfl:    Misc. Devices (TUB TRANSFER BOARD) MISC, 1 Device by Does not apply route as needed., Disp: 1 each, Rfl: 0   montelukast  (SINGULAIR ) 10 MG tablet, TAKE 1 TABLET BY MOUTH NIGHTLY, Disp: 90 tablet, Rfl: 3   Multiple Vitamins-Minerals (HAIR SKIN & NAILS) TABS, Take 1 tablet by mouth daily., Disp: , Rfl:    naloxone  (NARCAN ) nasal spray 4 mg/0.1 mL, Place 1 spray into the nose as needed for up to 365 doses (for opioid-induced respiratory depresssion). In case of emergency (overdose), spray once into each nostril. If no response within 3 minutes, repeat application and call 911., Disp: 1 each, Rfl: 0   ondansetron  (ZOFRAN ) 4 MG tablet, Take 1 tablet (4 mg total) by mouth every 8 (eight) hours as needed for nausea or  vomiting., Disp: 90 tablet, Rfl: 1   pantoprazole  (PROTONIX ) 40 MG tablet, Take 1 tablet (40 mg total) by mouth daily. 30 min before food, Disp: 90 tablet, Rfl: 3   pregabalin  (LYRICA ) 75 MG capsule, Take 1 capsule (75 mg total) by mouth 3 (three) times daily., Disp: 90 capsule, Rfl: 5   rosuvastatin  (CRESTOR ) 20 MG tablet, TAKE 1 TABLET BY MOUTH DAILY, Disp: 90 tablet, Rfl: 1   senna-docusate (SENOKOT-S) 8.6-50 MG tablet, Take 1 tablet by mouth at bedtime as needed for mild  constipation., Disp: 10 tablet, Rfl: 0   tirzepatide  (MOUNJARO ) 15 MG/0.5ML Pen, Inject 15 mg into the skin once a week., Disp: 6 mL, Rfl: 3   Vibegron  (GEMTESA ) 75 MG TABS, Take 1 tablet (75 mg total) by mouth daily., Disp: 30 tablet, Rfl: 11  Physical exam:  Vitals:   04/01/23 1523  BP: 120/71  Pulse: 75  Temp: 98.2 F (36.8 C)  TempSrc: Tympanic  SpO2: 96%  Weight: 245 lb (111.1 kg)   Physical Exam Vitals reviewed.  Constitutional:      Appearance: She is not ill-appearing.  Pulmonary:     Effort: No respiratory distress.  Abdominal:     General: There is no distension.     Tenderness: There is no abdominal tenderness.  Skin:    Coloration: Skin is not pale.     Findings: No bruising.  Neurological:     Mental Status: She is alert and oriented to person, place, and time. Mental status is at baseline.  Psychiatric:        Mood and Affect: Mood normal.        Behavior: Behavior normal.         Latest Ref Rng & Units 01/20/2023    4:03 PM  CMP  Glucose 70 - 99 mg/dL 898   BUN 6 - 23 mg/dL 24   Creatinine 9.59 - 1.20 mg/dL 8.48   Sodium 864 - 854 mEq/L 142   Potassium 3.5 - 5.1 mEq/L 4.2   Chloride 96 - 112 mEq/L 102   CO2 19 - 32 mEq/L 27   Calcium  8.4 - 10.5 mg/dL 9.1   Total Protein 6.0 - 8.3 g/dL 6.6   Total Bilirubin 0.2 - 1.2 mg/dL 0.6   Alkaline Phos 39 - 117 U/L 88   AST 0 - 37 U/L 20   ALT 0 - 35 U/L 16       Latest Ref Rng & Units 04/01/2023    3:04 PM  CBC  WBC 4.0 - 10.5 K/uL  4.6   Hemoglobin 12.0 - 15.0 g/dL 86.3   Hematocrit 63.9 - 46.0 % 42.9   Platelets 150 - 400 K/uL 200    Iron /TIBC/Ferritin/ %Sat    Component Value Date/Time   IRON  82 04/01/2023 1504   IRON  74 08/12/2012 0918   TIBC 379 04/01/2023 1504   TIBC 413 08/12/2012 0918   FERRITIN 39 04/01/2023 1504   FERRITIN 19 08/12/2012 0918   IRONPCTSAT 22 04/01/2023 1504   IRONPCTSAT 11 (L) 08/22/2020 1535    Assessment and plan- Patient is a 73 y.o. female who returns to clinic for follow up of her   Iron  Deficiency Anemia- Hemoglobin had previously dropped to 9.8 in June 2024, now 13.6. Ferritin and iron  studies are currently pending but are improved- ferritin 39, iron  sat 22%. She has mild symptoms which may be unrelated. She last received IV iron  March 2023. Proceed with venofer  next week then return to clinic for labs in 4 months then again with reevaluation with Dr Melanee, +/- venofer  in 8 months.    Visit Diagnosis 1. Iron  deficiency anemia, unspecified iron  deficiency anemia type     Tinnie Dawn, DNP, AGNP-C, AOCNP [facility] [phone] (clinic) 04/01/2023

## 2023-04-05 ENCOUNTER — Encounter: Payer: Medicare HMO | Attending: Physician Assistant | Admitting: Physician Assistant

## 2023-04-05 DIAGNOSIS — I890 Lymphedema, not elsewhere classified: Secondary | ICD-10-CM | POA: Diagnosis not present

## 2023-04-05 DIAGNOSIS — Z7901 Long term (current) use of anticoagulants: Secondary | ICD-10-CM | POA: Diagnosis not present

## 2023-04-05 DIAGNOSIS — E11621 Type 2 diabetes mellitus with foot ulcer: Secondary | ICD-10-CM | POA: Insufficient documentation

## 2023-04-05 DIAGNOSIS — Z96612 Presence of left artificial shoulder joint: Secondary | ICD-10-CM | POA: Insufficient documentation

## 2023-04-05 DIAGNOSIS — E1140 Type 2 diabetes mellitus with diabetic neuropathy, unspecified: Secondary | ICD-10-CM | POA: Diagnosis not present

## 2023-04-05 DIAGNOSIS — S51811A Laceration without foreign body of right forearm, initial encounter: Secondary | ICD-10-CM | POA: Diagnosis not present

## 2023-04-05 DIAGNOSIS — L89616 Pressure-induced deep tissue damage of right heel: Secondary | ICD-10-CM | POA: Insufficient documentation

## 2023-04-05 DIAGNOSIS — M109 Gout, unspecified: Secondary | ICD-10-CM | POA: Insufficient documentation

## 2023-04-05 DIAGNOSIS — L89623 Pressure ulcer of left heel, stage 3: Secondary | ICD-10-CM | POA: Insufficient documentation

## 2023-04-05 DIAGNOSIS — I10 Essential (primary) hypertension: Secondary | ICD-10-CM | POA: Insufficient documentation

## 2023-04-05 DIAGNOSIS — Z86718 Personal history of other venous thrombosis and embolism: Secondary | ICD-10-CM | POA: Diagnosis not present

## 2023-04-05 DIAGNOSIS — I480 Paroxysmal atrial fibrillation: Secondary | ICD-10-CM | POA: Insufficient documentation

## 2023-04-05 DIAGNOSIS — I872 Venous insufficiency (chronic) (peripheral): Secondary | ICD-10-CM | POA: Insufficient documentation

## 2023-04-05 DIAGNOSIS — I87323 Chronic venous hypertension (idiopathic) with inflammation of bilateral lower extremity: Secondary | ICD-10-CM | POA: Insufficient documentation

## 2023-04-05 DIAGNOSIS — M1990 Unspecified osteoarthritis, unspecified site: Secondary | ICD-10-CM | POA: Insufficient documentation

## 2023-04-06 ENCOUNTER — Other Ambulatory Visit: Payer: Self-pay | Admitting: Oncology

## 2023-04-07 ENCOUNTER — Inpatient Hospital Stay: Payer: Medicare HMO

## 2023-04-07 VITALS — BP 124/65 | HR 55 | Temp 96.8°F | Resp 16

## 2023-04-07 DIAGNOSIS — D509 Iron deficiency anemia, unspecified: Secondary | ICD-10-CM | POA: Diagnosis not present

## 2023-04-07 DIAGNOSIS — D508 Other iron deficiency anemias: Secondary | ICD-10-CM

## 2023-04-07 DIAGNOSIS — Z79899 Other long term (current) drug therapy: Secondary | ICD-10-CM | POA: Diagnosis not present

## 2023-04-07 MED ORDER — IRON SUCROSE 20 MG/ML IV SOLN
200.0000 mg | INTRAVENOUS | Status: DC
Start: 2023-04-07 — End: 2023-04-07
  Administered 2023-04-07: 200 mg via INTRAVENOUS
  Filled 2023-04-07: qty 10

## 2023-04-07 NOTE — Progress Notes (Signed)
 SHANOAH, ASBILL [name] ([id number]) [id number] Page 1 of 9 Visit Report for 04/05/2023 Arrival Information Details Patient Name: Date of Service: [name] 04/05/2023 12:45 PM Medical Record Number: [id number] Patient Account Number: [id number] Date of Birth/Sex: Treating RN: [birth year] [name] (73 y.o. [name] Primary Care [name]: [name] Other Clinician: Referring [name]: Treating [name]/Extender: [name] [name] [name] in Treatment: 26 Visit Information History Since Last Visit All ordered tests and consults were completed: No Patient Arrived: Wheel Chair Added or deleted any medications: No Arrival Time: 12:58 Any new allergies or adverse reactions: No Transfer Assistance: EasyPivot Patient Lift Had a fall or experienced change in No Patient Identification Verified: Yes activities of daily living that may affect Secondary Verification Process Completed: Yes risk of falls: Patient Requires Transmission-Based Precautions: No Signs or symptoms of abuse/neglect since last visito No Patient Has Alerts: Yes Hospitalized since last visit: No Patient Alerts: Patient on Blood Thinner Implantable device outside of the clinic excluding No ABI L 1.1 02/05/22 cellular tissue based products placed in the center ABI R 1.12 02/05/22 since last visit: Has Dressing in Place as Prescribed: Yes Pain Present Now: Yes Electronic Signature(s) Signed: 04/05/2023 4:48:09 PM By: [name] Entered By: [name] on 04/05/2023 12:59:36 -------------------------------------------------------------------------------- Clinic Level of Care Assessment Details Patient Name: Date of Service: [name] 04/05/2023 12:45 PM Medical Record Number: [id number] Patient Account Number: [id number] Date of Birth/Sex: Treating RN: [name] (73 y.o. [name] Primary Care [name]: [name] Other Clinician: Referring [name]: Treating  [name]/Extender: [name] [name] [name] in Treatment: 26 Clinic Level of Care Assessment Items TOOL 1 Quantity Score []  - 0 Use when EandM and Procedure is performed on INITIAL visit ASSESSMENTS - Nursing Assessment / Reassessment []  - 0 General Physical Exam (combine w/ comprehensive assessment (listed just below) when performed on new pt. evals) []  - 0 Comprehensive Assessment (HX, ROS, Risk Assessments, Wounds Hx, etc.) [name], [name] ([id number]) [id number] Page 2 of 9 ASSESSMENTS - Wound and Skin Assessment / Reassessment []  - 0 Dermatologic / Skin Assessment (not related to wound area) ASSESSMENTS - Ostomy and/or Continence Assessment and Care []  - 0 Incontinence Assessment and Management []  - 0 Ostomy Care Assessment and Management (repouching, etc.) PROCESS - Coordination of Care []  - 0 Simple Patient / Family Education for ongoing care []  - 0 Complex (extensive) Patient / Family Education for ongoing care []  - 0 Staff obtains Chiropractor, Records, T Results / Process Orders est []  - 0 Staff telephones HHA, Nursing Homes / Clarify orders / etc []  - 0 Routine Transfer to another Facility (non-emergent condition) []  - 0 Routine Hospital Admission (non-emergent condition) []  - 0 New Admissions / Manufacturing Engineer / Ordering NPWT Apligraf, etc. , []  - 0 Emergency Hospital Admission (emergent condition) PROCESS - Special Needs []  - 0 Pediatric / Minor Patient Management []  - 0 Isolation Patient Management []  - 0 Hearing / Language / Visual special needs []  - 0 Assessment of Community assistance (transportation, D/C planning, etc.) []  - 0 Additional assistance / Altered mentation []  - 0 Support Surface(s) Assessment (bed, cushion, seat, etc.) INTERVENTIONS - Miscellaneous []  - 0 External ear exam []  - 0 Patient Transfer (multiple staff / Nurse, Adult / Similar devices) []  - 0 Simple Staple / Suture removal (25 or  less) []  - 0 Complex Staple / Suture removal (26 or more) []  - 0 Hypo/Hyperglycemic Management (do not check if billed separately) []  - 0 Ankle /  Brachial Index (ABI) - do not check if billed separately Has the patient been seen at the hospital within the last three years: Yes Total Score: 0 Level Of Care: ____ Electronic Signature(s) Signed: 04/05/2023 4:48:09 PM By: [name] Entered By: [name] on 04/05/2023 13:31:12 -------------------------------------------------------------------------------- Encounter Discharge Information Details Patient Name: Date of Service: [name], Dawn PIPER [name]. 04/05/2023 12:45 PM Medical Record Number: [id number] Patient Account Number: [id number] Date of Birth/Sex: Treating RN: [birth year] (73 y.o. [name] Primary Care Sarafina Puthoff: [name] Other Clinician: Referring Taha Dimond: Treating Alexianna Nachreiner/Extender: [name] [name] [name], [name] ([id number]) [id number] Page 3 of 9 Weeks in Treatment: 26 Encounter Discharge Information Items Post Procedure Vitals Discharge Condition: Stable Temperature (F): 98.3 Ambulatory Status: Wheelchair Pulse (bpm): 62 Discharge Destination: Home Respiratory Rate (breaths/min): 18 Transportation: Private Auto Blood Pressure (mmHg): 142/89 Accompanied By: husband Schedule Follow-up Appointment: Yes Clinical Summary of Care: Electronic Signature(s) Signed: 04/05/2023 4:48:09 PM By: [name] Entered By: [name] on 04/05/2023 14:07:06 -------------------------------------------------------------------------------- Lower Extremity Assessment Details Patient Name: Date of Service: [name] 04/05/2023 12:45 PM Medical Record Number: [id number] Patient Account Number: [id number] Date of Birth/Sex: Treating RN: [birth year] (73 y.o. [name] Primary Care Kevan Prouty: [name] Other Clinician: Referring Aston Lieske: Treating Inioluwa Baris/Extender:  [name] [name] Weeks in Treatment: 26 Edema Assessment Assessed: Colletta: Yes] Glenis: No] Edema: [Left: N] [Right: o] Calf Left: Right: Point of Measurement: 33 cm From Medial Instep 31 cm Ankle Left: Right: Point of Measurement: 7 cm From Medial Instep 17 cm Knee To Floor Left: Right: From Medial Instep 46 cm Vascular Assessment Pulses: Dorsalis Pedis Palpable: [Left:Yes] Extremity colors, hair growth, and conditions: Extremity Color: [Left:Mottled] Hair Growth on Extremity: [Left:Yes] Temperature of Extremity: [Left:Warm < 3 seconds] Electronic Signature(s) Signed: 04/05/2023 4:48:09 PM By: [name] Signed: 04/05/2023 4:49:55 PM By: Dawn Blossom MSN RN CNS WTA Entered By: [name] on 04/05/2023 13:11:55 [name] ([id number]) [id number] Page 4 of 9 -------------------------------------------------------------------------------- Multi Wound Chart Details Patient Name: Date of Service: [name] 04/05/2023 12:45 PM Medical Record Number: [id number] Patient Account Number: [id number] Date of Birth/Sex: Treating RN: [name] (73 y.o. [name] Primary Care Mehkai Gallo: [name] Other Clinician: Referring Oleva Koo: Treating Natassia Guthridge/Extender: [name] [name] Weeks in Treatment: 26 Vital Signs Height(in): 64 Pulse(bpm): 62 Weight(lbs): 250 Blood Pressure(mmHg): 142/89 Body Mass Index(BMI): 42.9 Temperature(F): 98.3 Respiratory Rate(breaths/min): 18 [24:Photos:] [N/A:N/A] Left Calcaneus N/A N/A Wound Location: Gradually Appeared N/A N/A Wounding Event: Pressure Ulcer N/A N/A Primary Etiology: Asthma, Pneumothorax, Arrhythmia, N/A N/A Comorbid History: Hypertension, Type II Diabetes, Gout, Osteoarthritis, Neuropathy 08/22/2022 N/A N/A Date Acquired: 73 N/A N/A Weeks of Treatment: Open N/A N/A Wound Status: No N/A N/A Wound Recurrence: 0.2x0.2x0.1 N/A N/A Measurements L x W x D  (cm) 0.031 N/A N/A A (cm) : rea 0.003 N/A N/A Volume (cm) : 97.40% N/A N/A % Reduction in A rea: 97.50% N/A N/A % Reduction in Volume: Category/Stage III N/A N/A Classification: Small N/A N/A Exudate A mount: Serous N/A N/A Exudate Type: amber N/A N/A Exudate Color: Distinct, outline attached N/A N/A Wound Margin: Small (1-33%) N/A N/A Granulation A mount: Red N/A N/A Granulation Quality: Medium (34-66%) N/A N/A Necrotic A mount: Fat Layer (Subcutaneous Tissue): Yes N/A N/A Exposed Structures: Fascia: No Tendon: No Muscle: No Joint: No Bone: No None N/A N/A Epithelialization: Treatment Notes Electronic Signature(s) Signed: 04/05/2023 4:48:09 PM By: [name] Entered By: [name] on 04/05/2023 13:12:02 [name],  [name] [id number] [id number] Page 5 of 9 -------------------------------------------------------------------------------- Multi-Disciplinary Care Plan Details Patient Name: Date of Service: [name] 04/05/2023 12:45 PM Medical Record Number: [id number] Patient Account Number: [id number] Date of Birth/Sex: Treating RN: [birth year] (73 y.o. [name] Primary Care Ameia Morency: [name] Other Clinician: Referring Junior Huezo: Treating Nai Borromeo/Extender: [name] [name] [name] in Treatment: 26 Active Inactive Necrotic Tissue Nursing Diagnoses: Knowledge deficit related to management of necrotic/devitalized tissue Goals: Necrotic/devitalized tissue will be minimized in the wound bed Date Initiated: 10/01/2022 Target Resolution Date: 11/01/2022 Goal Status: Active Interventions: Assess patient pain level pre-, during and post procedure and prior to discharge Treatment Activities: Apply topical anesthetic as ordered : 10/01/2022 Notes: Wound/Skin Impairment Nursing Diagnoses: Knowledge deficit related to ulceration/compromised skin integrity Goals: Patient/caregiver will verbalize understanding  of skin care regimen Date Initiated: 10/01/2022 Target Resolution Date: 11/01/2022 Goal Status: Active Ulcer/skin breakdown will have a volume reduction of 30% by week 4 Date Initiated: 10/01/2022 Target Resolution Date: 11/01/2022 Goal Status: Active Ulcer/skin breakdown will have a volume reduction of 50% by week 8 Date Initiated: 10/01/2022 Target Resolution Date: 12/02/2022 Goal Status: Active Ulcer/skin breakdown will have a volume reduction of 80% by week 12 Date Initiated: 10/01/2022 Target Resolution Date: 01/01/2023 Goal Status: Active Ulcer/skin breakdown will heal within 14 weeks Date Initiated: 10/01/2022 Target Resolution Date: 02/01/2023 Goal Status: Active Interventions: Assess patient/caregiver ability to obtain necessary supplies Assess patient/caregiver ability to perform ulcer/skin care regimen upon admission and as needed Assess ulceration(s) every visit Notes: Electronic Signature(s) Signed: 04/05/2023 4:48:09 PM By: Dawn Mercy Dawn Dawn KATHEE ([id number]) [id number] Page 6 of 9 Signed: 04/05/2023 4:49:55 PM By: Dawn Blossom MSN RN CNS WTA Entered By: Dawn Mercy on 04/05/2023 13:31:26 -------------------------------------------------------------------------------- Pain Assessment Details Patient Name: Date of Service: [name] 04/05/2023 12:45 PM Medical Record Number: [id number] Patient Account Number: [id number] Date of Birth/Sex: Treating RN: [birth year] (73 y.o. [name] Primary Care Kasi Lasky: [name] Other Clinician: Referring Jasia Hiltunen: Treating Tiffanye Hartmann/Extender: [name] [name] Weeks in Treatment: 26 Active Problems Location of Pain Severity and Description of Pain Patient Has Paino Yes Site Locations Pain Location: Pain in Ulcers Duration of the Pain. Constant / Intermittento Intermittent Rate the pain. Current Pain Level: 3 Character of Pain Describe the Pain: Sharp, Shooting,  Stabbing Pain Management and Medication Current Pain Management: Medication: Yes Cold Application: No Rest: Yes Massage: No Activity: No T.E.N.S.: No Heat Application: No Leg drop or elevation: No Is the Current Pain Management Adequate: Inadequate How does your wound impact your activities of daily livingo Sleep: No Bathing: No Appetite: No Relationship With Others: No Bladder Continence: No Emotions: No Bowel Continence: No Work: No Toileting: No Drive: No Dressing: No Hobbies: No Notes painful when walking Electronic Signature(s) Signed: 04/05/2023 4:48:09 PM By: Dawn Mercy Signed: 04/05/2023 4:49:55 PM By: Dawn Blossom MSN RN CNS [address] [name] ([id number]) [phone].pdf Page 7 of 9 Entered By: Dawn Mercy on 04/05/2023 13:03:26 -------------------------------------------------------------------------------- Patient/Caregiver Education Details Patient Name: Date of Service: [name] 1/13/2025andnbsp12:45 PM Medical Record Number: [id number] Patient Account Number: [id number] Date of Birth/Gender: Treating RN: [name] (73 y.o. [name] Primary Care Physician: [name] Other Clinician: Referring Physician: Treating Physician/Extender: [name] [name] [name] in Treatment: 26 Education Assessment Education Provided To: Patient Education Topics Provided Wound/Skin Impairment: Handouts: Other: continue wound care as directed Methods: Explain/Verbal Responses: State content correctly Electronic Signature(s) Signed: 04/05/2023 4:48:09 PM By: Dawn Mercy Entered  By: [name] on 04/05/2023 13:31:48 -------------------------------------------------------------------------------- Wound Assessment Details Patient Name: Date of Service: [name] 04/05/2023 12:45 PM Medical Record Number: [id number] Patient Account Number: [id number] Date of Birth/Sex: Treating RN: [name] (73 y.o.  [name] Primary Care Kenzi Bardwell: [name] Other Clinician: Referring Dorothy Landgrebe: Treating Cid Agena/Extender: [name] [name] Weeks in Treatment: 26 Wound Status Wound Number: 24 Primary Pressure Ulcer Etiology: Wound Location: Left Calcaneus Wound Open Wounding Event: Gradually Appeared Status: Date Acquired: 08/22/2022 Comorbid Asthma, Pneumothorax, Arrhythmia, Hypertension, Type II Weeks Of Treatment: 26 History: Diabetes, Gout, Osteoarthritis, Neuropathy Clustered Wound: No Photos [name], [name] ([id number]) [phone].pdf Page 8 of 9 Wound Measurements Length: (cm) 0.2 Width: (cm) 0.2 Depth: (cm) 0.1 Area: (cm) 0.031 Volume: (cm) 0.003 % Reduction in Area: 97.4% % Reduction in Volume: 97.5% Epithelialization: None Wound Description Classification: Category/Stage III Wound Margin: Distinct, outline attached Exudate Amount: Small Exudate Type: Serous Exudate Color: amber Foul Odor After Cleansing: No Slough/Fibrino Yes Wound Bed Granulation Amount: Small (1-33%) Exposed Structure Granulation Quality: Red Fascia Exposed: No Necrotic Amount: Medium (34-66%) Fat Layer (Subcutaneous Tissue) Exposed: Yes Necrotic Quality: Adherent Slough, Bone Tendon Exposed: No Muscle Exposed: No Joint Exposed: No Bone Exposed: No Treatment Notes Wound #24 (Calcaneus) Wound Laterality: Left Cleanser Normal Saline Discharge Instruction: Wash your hands with soap and water . Remove old dressing, discard into plastic bag and place into trash. Cleanse the wound with Normal Saline prior to applying a clean dressing using gauze sponges, not tissues or cotton balls. Do not scrub or use excessive force. Pat dry using gauze sponges, not tissue or cotton balls. Soap and Water  Discharge Instruction: Gently cleanse wound with antibacterial soap, rinse and pat dry prior to dressing wounds Peri-Wound Care Topical Primary Dressing Prisma 4.34  (in) Discharge Instruction: Moisten w/normal saline or sterile water ; Cover wound as directed. Do not remove from wound bed. Secondary Dressing Coverlet Latex-Free Fabric Adhesive Dressings Discharge Instruction: 1.5 x 2 Secured With Compression Wrap Compression Stockings Add-Ons Electronic Signature(s) Signed: 04/05/2023 4:48:09 PM By: [name] Signed: 04/05/2023 4:49:55 PM By: Dawn Blossom MSN RN CNS WTA Entered By: [name] on 04/05/2023 13:10:40 Dawn MOM [name] ([id number]) [id number] Page 9 of 9 -------------------------------------------------------------------------------- Vitals Details Patient Name: Date of Service: [name] 04/05/2023 12:45 PM Medical Record Number: [id number] Patient Account Number: [id number] Date of Birth/Sex: Treating RN: [birth year]-Jun-[name] (73 y.o. [name] Primary Care Jhovany Weidinger: [name] Other Clinician: Referring Rikki Trosper: Treating Sun Kihn/Extender: [name] [name] Weeks in Treatment: 26 Vital Signs Time Taken: 13:00 Temperature (F): 98.3 Height (in): 64 Pulse (bpm): 62 Weight (lbs): 250 Respiratory Rate (breaths/min): 18 Body Mass Index (BMI): 42.9 Blood Pressure (mmHg): 142/89 Reference Range: 80 - 120 mg / dl Electronic Signature(s) Signed: 04/05/2023 4:48:09 PM By: [name] Entered By: [name] on 04/05/2023 13:02:12

## 2023-04-07 NOTE — Progress Notes (Addendum)
 [name] ([id number]) [id number] Page 1 of 11 Visit Report for 04/05/2023 Chief Complaint Document Details Patient Name: Date of Service: [name] 04/05/2023 12:45 PM Medical Record Number: [id number] Patient Account Number: [id number] Date of Birth/Sex: Treating RN: Dec 25, 1950 (73 y.o. [name] [name] Primary Care Provider: Gretel App Other Clinician: Referring Provider: Treating Provider/Extender: Bethena Andre Gretel App Weeks in Treatment: 26 Information Obtained from: Patient Chief Complaint Left heel ulcer and right forearm skin tear Electronic Signature(s) Signed: 04/05/2023 1:20:38 PM By: Bethena Andre PA-C Entered By: Bethena Andre on 04/05/2023 13:20:38 -------------------------------------------------------------------------------- Debridement Details Patient Name: Date of Service: [name] Dawn HARLON KATHEE. 04/05/2023 12:45 PM Medical Record Number: [id number] Patient Account Number: [id number] Date of Birth/Sex: Treating RN: March 25, 1950 (73 y.o. [name] [name] Primary Care Provider: Gretel App Other Clinician: Referring Provider: Treating Provider/Extender: Bethena Andre Gretel App Weeks in Treatment: 26 Debridement Performed for Assessment: Wound #24 Left Calcaneus Performed By: Physician Bethena Andre, PA-C The following information was scribed by: Purcell Sniff The information was scribed for: Bethena Andre Debridement Type: Debridement Level of Consciousness (Pre-procedure): Awake and Alert Pre-procedure Verification/Time Out Yes - 13:25 Taken: Start Time: 13:25 Percent of Wound Bed Debrided: 100% T Area Debrided (cm): otal 0.03 Dawn and other material debrided: Viable, Non-Viable, Callus, Slough, Subcutaneous, Slough Level: Skin/Subcutaneous Dawn Debridement Description: Excisional Instrument: Curette Bleeding: Minimum Hemostasis Achieved: Pressure Response to Treatment: Procedure was tolerated well Level of  Consciousness [name], [name] ([id number]) [phone].pdf Page 2 of 11 Level of Consciousness (Post- Awake and Alert procedure): Post Debridement Measurements of Total Wound Length: (cm) 0.2 Stage: Category/Stage III Width: (cm) 0.2 Depth: (cm) 0.1 Volume: (cm) 0.003 Character of Wound/Ulcer Post Debridement: Stable Post Procedure Diagnosis Same as Pre-procedure Electronic Signature(s) Signed: 04/05/2023 4:43:18 PM By: Bethena Andre PA-C Signed: 04/05/2023 4:48:09 PM By: Purcell Sniff Signed: 04/05/2023 4:49:55 PM By: Dawn Blossom MSN RN CNS WTA Entered By: Purcell Sniff on 04/05/2023 13:26:19 -------------------------------------------------------------------------------- HPI Details Patient Name: Date of Service: [name], [name]. 04/05/2023 12:45 PM Medical Record Number: [id number] Patient Account Number: [id number] Date of Birth/Sex: Treating RN: 08-11-1950 (73 y.o. [name] [name] Primary Care Provider: Gretel App Other Clinician: Referring Provider: Treating Provider/Extender: Bethena Andre Gretel App Devra in Treatment: 26 History of Present Illness HPI Description: 06/28/20 upon evaluation today patient appears to be doing unfortunately somewhat poorly in regard to wounds that she has over her lower extremities. She has a wound on the left distal second toe, left posterior heel, and right anterior lower leg. With that being said all in all none of the wounds appear to be too bad the one that hurts her the most is actually the wound on the posterior heel which actually is also one of the smallest. Nonetheless I think there is some callus hiding what is really going on in this area. Fortunately there does not appear to be any obvious evidence of infection which is great news. Patient does have a history of diabetes mellitus type 2, chronic venous insufficiency, hypertension, history of DVT and is on long-term anticoagulant s, therapy,  Eliquis . 07/05/20 upon evaluation today patient appears to be doing well currently with regard to her wounds. I feel like she is making progress which is great news and overall there is no signs of active infection at this time. No fevers, chills, nausea, vomiting, or diarrhea. 4/29; patient has a only a small area remaining on the tip of her left second toe and a small area  remaining on the left heel. The area on the right anterior lower leg is healed. They tell me that she had remote podiatric surgery on the toes of the left foot however they are very fixed in position and I wonder whether they are making it difficult to relieve pressure in her foot wear. She is a type II diabetic with neuropathy as well. 08/01/2020 upon evaluation today patient appears to be doing well currently in regard to her wounds. In fact everything appears to be doing much better. The posterior aspect of her heel is actually giving her some trouble not the Achilles area but a small wound that we did not even have marked. In fact I had noted this before. Nonetheless we are going to addressed that today. 5/27; most of the patient's wounds are healed including the left Achilles heel left toe. Right forearm is also healed. She has a new abrasion posteriorly in the right elbow area just above the olecranon this happened today with an abrasion from her car door 6/15; the patient's wounds on the left Achilles and left second toe remain healed. The area on the right forearm is also just about healed in fact the skin I replaced last time is looking viable which is good. She had me look at the left heel again which is not in the area of the wound more towards the tip of the heel at the Achilles insertion site as well as the tip of the left foot second toe. In the Achilles area that is clearly is friction probably in bed at night. Not really near where the wound was. The left second toe remains healed although this is hammered and  overhanging first toe also puts pressure in this area 09/13/2020 upon evaluation today patient appears to be doing well with regard to all previous wounds that she had. Everything is completely healed. With that being said I do think that the patient unfortunately has a new skin tear on the right forearm which is due to her put a Band-Aid on it and then when it pulled off this caused some issues with skin tearing. Nonetheless I think that this needs to be addressed today. Hopefully will take too long to get this to heal. She is having some issues with pain in her heel but I think this is more related to be honest to her neuropathy as opposed to anything else. Readmission: 10/30/2020 upon evaluation today patient appears to be doing poorly in regard to her legs bilaterally. She is also having an issue with her fourth toe on the left. Fortunately there does not appear to be any signs of active infection at this time. No fevers, chills, nausea, vomiting, or diarrhea. It is actually been quite a while since we have seen her. In fact June 24 was the last time that she was here in the clinic. She tells me she did not come back because things were just doing pretty well. 11/29/2020 upon evaluation today patient actually appears to be doing well with regard to her arms those are completely healed. Her right leg is still open but [name], [name] ([id number]) [id number] Page 3 of 11 doing okay. The fourth toe of the left foot does show some signs here of having some issues with necrotic Dawn of an open wound. This is where a toenail was removed by podiatry. Subsequently I do think that we need to go ahead and see what we can do about getting this cleared away so being try  to get some healing going forward. 12/13/2020 upon evaluation today patient appears to be doing decently well in regard to her wound on the right leg. In general I think that she is making good progress here.  Unfortunately she continues to have significant issues with swelling in the left leg is now even more swollen at this point. For that reason I do believe she would be benefited by wrapping both sides although she is not interested in doing this. Overall I think that she is definitely making some good progress here nonetheless in regard to the right leg left leg leave some to be desired. 12/27/2020 upon evaluation today patient appears to be doing well with regard to her wound in regard to the legs. Unfortunately the toe was not doing nearly as well. I am much more concerned about infection and osteomyelitis here. She had the MRI but right now were not seeing obvious evidence of redness spreading up to her foot this is good news I do not have the result of the MRI as of yet. I am good to place her on doxycycline  however due to the fact that the wound does seem to be getting a little worse in regard to her toe. 01/14/2021 upon evaluation today patient appears to be doing well with regard to her ulcers. The right leg is doing well and even the toe seems to be doing well though she still has some evidence of bone exposed on the toe which does not appear to be doing quite as good as I would like to see just and the fact that is still not covered over new Dawn but looks tremendously better compared to 2 weeks ago. Overall very pleased in that regard. 01/28/2021 upon evaluation today patient's leg on the left actually appears to have new wounds this is due to her deciding to shave yesterday. Fortunately there does not appear to be any signs of infection currently. This is good news. Nonetheless I do believe that with these new areas open it would be best to wrap her leg to try to make sure we get this under control sooner rather than later. With regard to her toe there still does appear to be some evidence of bone exposed this was covered over with callus I did remove that today. Nonetheless I think this is  significantly smaller than previous. 02/11/2021 upon evaluation today patient appears to be doing okay in regard to her leg ulcers. I do not see anything obvious at this point this seems to be a complicating factor. I think that she is getting better. Were wrap of the right leg not the left leg. With that being said the left fourth toe ulcer still does have evidence of being open there is some drainage still on top of the wound I think we can to try to see about using something like a collagen dressing today to see if that would be of benefit for her. She is in agreement with giving this a trial. Nonetheless I am concerned about the fact that she continues to have issues here with this not healing it is confirmed chronic osteomyelitis she has been on antibiotics. I am going to extend those today as well. That is Keflex  and that will be for 1 additional month. Nonetheless if she is not doing significantly better by the next time I see her I think that the biggest thing is going to be for us  to go ahead and see what we can do about getting  her started with hyperbaric oxygen  therapy to try to save the toe. 02/20/2021 upon evaluation today patient presents for follow-up she actually came in a little bit early as the home health nurse was concerned about her toe becoming gangrenous. With that being said the good news is there is [place] no evidence of this and in fact the toe seems to be doing better at this point. What was over the toe was an area that had bled significantly and then subsequently dried making it look much worse than where things actually stand. There is just a very small opening remaining in the toe. In regard to her legs everything appears to be healed bilaterally. 02/24/2021 upon evaluation today patient appears to be doing well with regard to her toe ulcer. I am actually very pleased with where we stand and I think that she is making good progress at this time. There is still a very small  opening although I think the biggest issue is the collagen seems to be getting stuck and drying up because the wound is so small we probably just need to use something to keep this moist like a little piece of Xeroform gauze and see if we get this to close. 03/07/2021 upon evaluation patient actually appears to be making excellent progress in regard to her last wound on the toe. There is just a very tiny potential area that I can even be sure at first until I get some of the dry skin off when I did get this removed there was just a very tiny pinpoint opening that was barely even moist. Actually feel like this is very close to complete resolution. She has been using the Xeroform that is doing great. 03/25/2021 upon evaluation today patient appears to be doing well with regard to her toe ulcer. In fact this appears to be potentially completely healed. Fortunately I do not see anything draining at this point which is great news. She does have a small blood blister that is already drying up and looking okay on the third toe as well I am not sure what happened here she has an either. Otherwise she is having some weeping from the right leg this again is newer she tells me from last week weighing in that her pulmonologist and then today weighing in at the hematologist that she gained 30 pounds. This is quite significant if indeed true and I think she is to contact her primary care provider ASAP. This would account for why her right leg is weeping as well. 04/04/2020 upon evaluation today patient appears to be doing excellent in regard to her toe which I actually think is healed today. With that being said she unfortunately is having issues with her leg swelling and again several blisters on each leg that have reopened unfortunately. 05/02/2021 upon evaluation today patient appears to be doing well with regard to her wounds. In fact is down just 1 on the anterior portion of her right leg which is actually from a  blister. Otherwise she is doing awesome. 05/15/2021 upon evaluation today patient appears to be doing better in regard to her leg ulcer although she has a small irritated area from tape on the leg medially. She also has a skin tear to her right elbow region. Fortunately there does not appear to be any signs of active infection locally nor systemically at this point which is great news. No fevers, chills, nausea, vomiting, or diarrhea. 05/26/2021 upon evaluation today patient appears to be doing better  in regard to the actual wounds although she is having a lot of leaking from the right leg. She is also been using her lymphedema pumps along with wearing her compression. Nonetheless she is having a lot of trouble with her breathing I am concerned that the pumps may be contributing to this. 06/12/2021 upon evaluation today patient appears to be doing better in regard to a lot of things including her breathing as well as her legs. She still has a lot of swelling but the good news is this is better she lost about 20 pounds of fluid when she went to the hospital and they diuresed her. Subsequently this is good news. Nonetheless she actually told me that if she ever gets that bad again for me to yell at her to go to the hospital. She said that I was not quite forceful enough and telling her what she needed to do. She also notes she will try to be more receptive in the future. 06/19/2021 upon evaluation today patient appears to be doing well with regard to her wound. She is tolerating the dressing changes without complication. Fortunately her legs on the right are completely healed. She did have an area that she hit her Achilles on the left but this too seems to be pretty much closed at this point. Readmission: 02-05-2022 upon evaluation today patient appears to be doing well currently in regard to her left leg although her right leg is actually giving her quite a bit more trouble at this point. Fortunately  there does not appear to be any signs of infection locally or systemically which is great news. No fevers, chills, nausea, vomiting, or diarrhea. Patient tells me that she began having issues more recently with her legs in the right leg is definitely worse than the left as far as the way she feels as well and she states it was around September 16 that she first noted this and then when she called it took about 2-1/2 weeks to get in to see me. Her past medical history really has not changed since I saw her back in March everything is pretty much about the same repeat ABIs appear to be doing well currently at 1.1 on the left and 1.12 on the right. 02-10-2022 upon evaluation today patient actually appears to be doing well. Culture actually showed evidence of Pseudomonas but her legs look amazing today I do not see any signs of infection I do not believe that we need to add anything especially in light of the fact there is a lot of interactions with anything to treat the Pseudomonas in her current medication regimen. For that reason we will just hold with where we stand and continue with the current therapy. 02-24-2022 upon evaluation today patient appears to be doing excellent in fact she appears to be completely healed based on what we are seeing. Readmission: MEHGAN, SANTMYER ([id number]) [id number] Page 4 of 11 10-01-2022 upon evaluation today patient presents for follow-up evaluation here in the clinic she has not been seen since December 2023. At that point she was actually being seen for her legs that is actually doing much better which is great news. Overall very pleased with her leg situation and she has been doing great with her compression socks. With that being said unfortunately she does seem to be having issues with her heel this was secondary to having her left shoulder replacement and subsequent to that she was in the hospital for 6 days during that time  she  developed a pressure wound on the left heel. Unfortunately that is now it is giving her the most trouble she also has some irritation of the right heel but is not nearly as significant as far as that is concerned there is no signs of active infection at this time which is great news and very pleased in that regard. 10-09-2022 upon evaluation today patient appears to be doing well currently in regard to her heel we are definitely seeing signs of improvement which is great news and in general I think that we are moving in the right direction. I do not see any evidence of worsening overall which is great news. 7/25; pressure ulcer at the tip of her heel she has been using Promogran and sit to foot with a border foam. She is a type II diabetic. She is attempting to religiously offload this area with Prevalon boots at night open heeled shoes during the day. Her measurements are a lot better today per our intake nurse 11-03-2022 upon evaluation today patient actually has not been there for 3 weeks is a combination of a couple missed appointments on her end and last week she had to cancel because of weather. Either way her foot seems to be doing well in regard to the heel on the left lateral area. This is not showing signs of any obvious infection and at this point I think that she is making headway towards getting this closed although I do believe there is quite a bit of slough and biofilm skin need to be cleaned off just a little bit of callus around the edges of the wound. 11-10-2022 upon evaluation today patient appears to be doing well currently in regard to her heel ulcer. She has been tolerating the dressing changes without complication and in general I feel like she is making excellent headway towards complete closure. 8/27; patient has a wound on the left heel near the tip. Using Promogran and Zituvimet. Comes in with open heeled shoes however it has a lip that encompasses the wound area which is on the  distal part of the Achilles area. 12-08-2022 upon evaluation today patient appears to be doing well currently in regard to her wound on the foot which is actually doing excellent. This heel is measuring about 60% smaller than last time I saw her. Fortunately I think that she is really doing quite well at this time. 01-11-2023 upon evaluation today patient appears to be doing well currently in regard to her wound. She has been tolerating the dressing changes without complication. With that being said her heel actually seems to be doing well. She is inquiring as to whether or not she can do her 2-week follow-up. 01-26-2023 upon evaluation today patient appears to be doing well currently in regard to her heel ulcer I am pleased in that regard. Unfortunately she has a skin tear of the right lateral forearm which occurred as a result of a fall Sunday night. She tells me that she has been having issues here since that time with bruising and discomfort she is on Eliquis  so the bruising is not unexpected. With that being said I do believe that she is going to require something to keep this from sticking to the wound and allowing it to continue to heal I think that skin to be appropriate I discussed with her options today as well. 03-08-2023 upon evaluation today patient appears to be doing well currently in regard to her heel and her arm. The  arm appears to be completely healed which is great news that he will is significantly improved with getting very close to complete resolution. There is some need for sharp debridement today. 04-05-2023 upon evaluation today patient appears to be doing well currently in regard to her heel. She has been tolerating the dressing changes without complication. Fortunately I do not see any evidence of active infection at this time which is great news. Electronic Signature(s) Signed: 04/05/2023 2:40:31 PM By: Bethena Ferraris PA-C Entered By: Bethena Ferraris on 04/05/2023  14:40:31 -------------------------------------------------------------------------------- Physical Exam Details Patient Name: Date of Service: [name] 04/05/2023 12:45 PM Medical Record Number: [id number] Patient Account Number: [id number] Date of Birth/Sex: Treating RN: 03/10/1951 (73 y.o. [name] [name] Primary Care Provider: Gretel App Other Clinician: Referring Provider: Treating Provider/Extender: Bethena Ferraris Gretel App Weeks in Treatment: 26 Constitutional Well-nourished and well-hydrated in no acute distress. Respiratory normal breathing without difficulty. Psychiatric this patient is able to make decisions and demonstrates good insight into disease process. Alert and Oriented x 3. pleasant and cooperative. Notes Upon inspection patient's wound bed did require sharp debridement clearway necrotic debris and she tolerated the debridement today without complication. Postdebridement wound bed appears to be doing significantly better which is also news. [name], [name] ([id number]) [id number] Page 5 of 11 Electronic Signature(s) Signed: 04/05/2023 2:40:43 PM By: Bethena Ferraris PA-C Entered By: Bethena Ferraris on 04/05/2023 14:40:43 -------------------------------------------------------------------------------- Physician Orders Details Patient Name: Date of Service: [name], [name] 04/05/2023 12:45 PM Medical Record Number: [id number] Patient Account Number: [id number] Date of Birth/Sex: Treating RN: Apr 06, 1950 (73 y.o. [name] [name] Primary Care Provider: Gretel App Other Clinician: Referring Provider: Treating Provider/Extender: Bethena Ferraris Gretel App Devra in Treatment: 26 The following information was scribed by: Purcell Sniff The information was scribed for: Bethena Ferraris Verbal / Phone Orders: No Diagnosis Coding ICD-10 Coding Code Description E11.621 Type 2 diabetes mellitus with foot ulcer I87.323 Chronic venous  hypertension (idiopathic) with inflammation of bilateral lower extremity L89.623 Pressure ulcer of left heel, stage 3 S51.811A Laceration without foreign body of right forearm, initial encounter L89.616 Pressure-induced deep Dawn damage of right heel Z79.01 Long term (current) use of anticoagulants I48.0 Paroxysmal atrial fibrillation Follow-up Appointments Return Appointment in 2 weeks. Anesthetic (Use 'Patient Medications' Section for Anesthetic Order Entry) Lidocaine  applied to wound bed Edema Control - Orders / Instructions Patient to wear own compression stockings. Remove compression stockings every night before going to bed and put on every morning when getting up. Elevate, Exercise Daily and Avoid Standing for Long Periods of Time. Wound Treatment Wound #24 - Calcaneus Wound Laterality: Left Cleanser: Normal Saline Every Other Day/30 Days Discharge Instructions: Wash your hands with soap and water . Remove old dressing, discard into plastic bag and place into trash. Cleanse the wound with Normal Saline prior to applying a clean dressing using gauze sponges, not tissues or cotton balls. Do not scrub or use excessive force. Pat dry using gauze sponges, not Dawn or cotton balls. Cleanser: Soap and Water  Every Other Day/30 Days Discharge Instructions: Gently cleanse wound with antibacterial soap, rinse and pat dry prior to dressing wounds Prim Dressing: Prisma 4.34 (in) Every Other Day/30 Days ary Discharge Instructions: Moisten w/normal saline or sterile water ; Cover wound as directed. Do not remove from wound bed. Secondary Dressing: Coverlet Latex-Free Fabric Adhesive Dressings Every Other Day/30 Days Discharge Instructions: 1.5 x 2 Electronic Signature(s) Signed: 04/05/2023 4:43:18 PM By: Bethena Ferraris PA-C Signed: 04/05/2023 4:48:09 PM By: Purcell Sniff [name],  [name] ([id number]) PM By: Venable, Angie [id number] Page 6 of 11 Signed: 04/05/2023  4:48:09 Entered By: Purcell Sniff on 04/05/2023 13:31:06 -------------------------------------------------------------------------------- Problem List Details Patient Name: Date of Service: [name] 04/05/2023 12:45 PM Medical Record Number: [id number] Patient Account Number: [id number] Date of Birth/Sex: Treating RN: 07/07/1950 (73 y.o. [name] [name] Primary Care Provider: Gretel App Other Clinician: Referring Provider: Treating Provider/Extender: Bethena Andre Gretel App Weeks in Treatment: 26 Active Problems ICD-10 Encounter Code Description Active Date MDM Diagnosis E11.621 Type 2 diabetes mellitus with foot ulcer 10/01/2022 No Yes I87.323 Chronic venous hypertension (idiopathic) with inflammation of bilateral lower 10/01/2022 No Yes extremity L89.623 Pressure ulcer of left heel, stage 3 10/01/2022 No Yes S51.811A Laceration without foreign body of right forearm, initial encounter 01/26/2023 No Yes L89.616 Pressure-induced deep Dawn damage of right heel 10/01/2022 No Yes Z79.01 Long term (current) use of anticoagulants 10/01/2022 No Yes I48.0 Paroxysmal atrial fibrillation 10/01/2022 No Yes Inactive Problems Resolved Problems Electronic Signature(s) Signed: 04/05/2023 1:20:29 PM By: Bethena Andre PA-C Entered By: Bethena Andre on 04/05/2023 13:20:29 [name] ([id number]) [id number] Page 7 of 11 -------------------------------------------------------------------------------- Progress Note Details Patient Name: Date of Service: [name] 04/05/2023 12:45 PM Medical Record Number: [id number] Patient Account Number: [id number] Date of Birth/Sex: Treating RN: 01-16-1951 (73 y.o. [name] [name] Primary Care Provider: Gretel App Other Clinician: Referring Provider: Treating Provider/Extender: Bethena Andre Gretel App Weeks in Treatment: 26 Subjective Chief Complaint Information obtained from Patient Left heel ulcer and  right forearm skin tear History of Present Illness (HPI) 06/28/20 upon evaluation today patient appears to be doing unfortunately somewhat poorly in regard to wounds that she has over her lower extremities. She has a wound on the left distal second toe, left posterior heel, and right anterior lower leg. With that being said all in all none of the wounds appear to be too bad the one that hurts her the most is actually the wound on the posterior heel which actually is also one of the smallest. Nonetheless I think there is some callus hiding what is really going on in this area. Fortunately there does not appear to be any obvious evidence of infection which is great news. Patient does have a history of diabetes mellitus type 2, chronic venous insufficiency, hypertension, history of DVT and is on long-term anticoagulant therapy, Eliquis . s, 07/05/20 upon evaluation today patient appears to be doing well currently with regard to her wounds. I feel like she is making progress which is great news and overall there is no signs of active infection at this time. No fevers, chills, nausea, vomiting, or diarrhea. 4/29; patient has a only a small area remaining on the tip of her left second toe and a small area remaining on the left heel. The area on the right anterior lower leg is healed. They tell me that she had remote podiatric surgery on the toes of the left foot however they are very fixed in position and I wonder whether they are making it difficult to relieve pressure in her foot wear. She is a type II diabetic with neuropathy as well. 08/01/2020 upon evaluation today patient appears to be doing well currently in regard to her wounds. In fact everything appears to be doing much better. The posterior aspect of her heel is actually giving her some trouble not the Achilles area but a small wound that we did not even have marked. In fact I had noted this before. Nonetheless we  are going to addressed that  today. 5/27; most of the patient's wounds are healed including the left Achilles heel left toe. Right forearm is also healed. She has a new abrasion posteriorly in the right elbow area just above the olecranon this happened today with an abrasion from her car door 6/15; the patient's wounds on the left Achilles and left second toe remain healed. The area on the right forearm is also just about healed in fact the skin I replaced last time is looking viable which is good. She had me look at the left heel again which is not in the area of the wound more towards the tip of the heel at the Achilles insertion site as well as the tip of the left foot second toe. In the Achilles area that is clearly is friction probably in bed at night. Not really near where the wound was. The left second toe remains healed although this is hammered and overhanging first toe also puts pressure in this area 09/13/2020 upon evaluation today patient appears to be doing well with regard to all previous wounds that she had. Everything is completely healed. With that being said I do think that the patient unfortunately has a new skin tear on the right forearm which is due to her put a Band-Aid on it and then when it pulled off this caused some issues with skin tearing. Nonetheless I think that this needs to be addressed today. Hopefully will take too long to get this to heal. She is having some issues with pain in her heel but I think this is more related to be honest to her neuropathy as opposed to anything else. Readmission: 10/30/2020 upon evaluation today patient appears to be doing poorly in regard to her legs bilaterally. She is also having an issue with her fourth toe on the left. Fortunately there does not appear to be any signs of active infection at this time. No fevers, chills, nausea, vomiting, or diarrhea. It is actually been quite a while since we have seen her. In fact June 24 was the last time that she was here in the  clinic. She tells me she did not come back because things were just doing pretty well. 11/29/2020 upon evaluation today patient actually appears to be doing well with regard to her arms those are completely healed. Her right leg is still open but doing okay. The fourth toe of the left foot does show some signs here of having some issues with necrotic Dawn of an open wound. This is where a toenail was removed by podiatry. Subsequently I do think that we need to go ahead and see what we can do about getting this cleared away so being try to get some healing going forward. 12/13/2020 upon evaluation today patient appears to be doing decently well in regard to her wound on the right leg. In general I think that she is making good progress here. Unfortunately she continues to have significant issues with swelling in the left leg is now even more swollen at this point. For that reason I do believe she would be benefited by wrapping both sides although she is not interested in doing this. Overall I think that she is definitely making some good progress here nonetheless in regard to the right leg left leg leave some to be desired. 12/27/2020 upon evaluation today patient appears to be doing well with regard to her wound in regard to the legs. Unfortunately the toe was not doing nearly  as well. I am much more concerned about infection and osteomyelitis here. She had the MRI but right now were not seeing obvious evidence of redness spreading up to her foot this is good news I do not have the result of the MRI as of yet. I am good to place her on doxycycline  however due to the fact that the wound does seem to be getting a little worse in regard to her toe. 01/14/2021 upon evaluation today patient appears to be doing well with regard to her ulcers. The right leg is doing well and even the toe seems to be doing well though she still has some evidence of bone exposed on the toe which does not appear to be doing  quite as good as I would like to see just and the fact that is still not covered over new Dawn but looks tremendously better compared to 2 weeks ago. Overall very pleased in that regard. 01/28/2021 upon evaluation today patient's leg on the left actually appears to have new wounds this is due to her deciding to shave yesterday. Fortunately there does not appear to be any signs of infection currently. This is good news. Nonetheless I do believe that with these new areas open it would be best to wrap her leg to try to make sure we get this under control sooner rather than later. With regard to her toe there still does appear to be some evidence of bone exposed this was covered over with callus I did remove that today. Nonetheless I think this is significantly smaller than previous. [name], [name] ([id number]) [id number] Page 8 of 11 02/11/2021 upon evaluation today patient appears to be doing okay in regard to her leg ulcers. I do not see anything obvious at this point this seems to be a complicating factor. I think that she is getting better. Were wrap of the right leg not the left leg. With that being said the left fourth toe ulcer still does have evidence of being open there is some drainage still on top of the wound I think we can to try to see about using something like a collagen dressing today to see if that would be of benefit for her. She is in agreement with giving this a trial. Nonetheless I am concerned about the fact that she continues to have issues here with this not healing it is confirmed chronic osteomyelitis she has been on antibiotics. I am going to extend those today as well. That is Keflex  and that will be for 1 additional month. Nonetheless if she is not doing significantly better by the next time I see her I think that the biggest thing is going to be for us  to go ahead and see what we can do about getting her started with hyperbaric oxygen  therapy to  try to save the toe. 02/20/2021 upon evaluation today patient presents for follow-up she actually came in a little bit early as the home health nurse was concerned about her toe becoming gangrenous. With that being said the good news is there is [place] no evidence of this and in fact the toe seems to be doing better at this point. What was over the toe was an area that had bled significantly and then subsequently dried making it look much worse than where things actually stand. There is just a very small opening remaining in the toe. In regard to her legs everything appears to be healed bilaterally. 02/24/2021 upon evaluation today patient appears to be  doing well with regard to her toe ulcer. I am actually very pleased with where we stand and I think that she is making good progress at this time. There is still a very small opening although I think the biggest issue is the collagen seems to be getting stuck and drying up because the wound is so small we probably just need to use something to keep this moist like a little piece of Xeroform gauze and see if we get this to close. 03/07/2021 upon evaluation patient actually appears to be making excellent progress in regard to her last wound on the toe. There is just a very tiny potential area that I can even be sure at first until I get some of the dry skin off when I did get this removed there was just a very tiny pinpoint opening that was barely even moist. Actually feel like this is very close to complete resolution. She has been using the Xeroform that is doing great. 03/25/2021 upon evaluation today patient appears to be doing well with regard to her toe ulcer. In fact this appears to be potentially completely healed. Fortunately I do not see anything draining at this point which is great news. She does have a small blood blister that is already drying up and looking okay on the third toe as well I am not sure what happened here she has an either.  Otherwise she is having some weeping from the right leg this again is newer she tells me from last week weighing in that her pulmonologist and then today weighing in at the hematologist that she gained 30 pounds. This is quite significant if indeed true and I think she is to contact her primary care provider ASAP. This would account for why her right leg is weeping as well. 04/04/2020 upon evaluation today patient appears to be doing excellent in regard to her toe which I actually think is healed today. With that being said she unfortunately is having issues with her leg swelling and again several blisters on each leg that have reopened unfortunately. 05/02/2021 upon evaluation today patient appears to be doing well with regard to her wounds. In fact is down just 1 on the anterior portion of her right leg which is actually from a blister. Otherwise she is doing awesome. 05/15/2021 upon evaluation today patient appears to be doing better in regard to her leg ulcer although she has a small irritated area from tape on the leg medially. She also has a skin tear to her right elbow region. Fortunately there does not appear to be any signs of active infection locally nor systemically at this point which is great news. No fevers, chills, nausea, vomiting, or diarrhea. 05/26/2021 upon evaluation today patient appears to be doing better in regard to the actual wounds although she is having a lot of leaking from the right leg. She is also been using her lymphedema pumps along with wearing her compression. Nonetheless she is having a lot of trouble with her breathing I am concerned that the pumps may be contributing to this. 06/12/2021 upon evaluation today patient appears to be doing better in regard to a lot of things including her breathing as well as her legs. She still has a lot of swelling but the good news is this is better she lost about 20 pounds of fluid when she went to the hospital and they diuresed her.  Subsequently this is good news. Nonetheless she actually told me that if she ever  gets that bad again for me to yell at her to go to the hospital. She said that I was not quite forceful enough and telling her what she needed to do. She also notes she will try to be more receptive in the future. 06/19/2021 upon evaluation today patient appears to be doing well with regard to her wound. She is tolerating the dressing changes without complication. Fortunately her legs on the right are completely healed. She did have an area that she hit her Achilles on the left but this too seems to be pretty much closed at this point. Readmission: 02-05-2022 upon evaluation today patient appears to be doing well currently in regard to her left leg although her right leg is actually giving her quite a bit more trouble at this point. Fortunately there does not appear to be any signs of infection locally or systemically which is great news. No fevers, chills, nausea, vomiting, or diarrhea. Patient tells me that she began having issues more recently with her legs in the right leg is definitely worse than the left as far as the way she feels as well and she states it was around September 16 that she first noted this and then when she called it took about 2-1/2 weeks to get in to see me. Her past medical history really has not changed since I saw her back in March everything is pretty much about the same repeat ABIs appear to be doing well currently at 1.1 on the left and 1.12 on the right. 02-10-2022 upon evaluation today patient actually appears to be doing well. Culture actually showed evidence of Pseudomonas but her legs look amazing today I do not see any signs of infection I do not believe that we need to add anything especially in light of the fact there is a lot of interactions with anything to treat the Pseudomonas in her current medication regimen. For that reason we will just hold with where we stand and continue  with the current therapy. 02-24-2022 upon evaluation today patient appears to be doing excellent in fact she appears to be completely healed based on what we are seeing. Readmission: 10-01-2022 upon evaluation today patient presents for follow-up evaluation here in the clinic she has not been seen since December 2023. At that point she was actually being seen for her legs that is actually doing much better which is great news. Overall very pleased with her leg situation and she has been doing great with her compression socks. With that being said unfortunately she does seem to be having issues with her heel this was secondary to having her left shoulder replacement and subsequent to that she was in the hospital for 6 days during that time she developed a pressure wound on the left heel. Unfortunately that is now it is giving her the most trouble she also has some irritation of the right heel but is not nearly as significant as far as that is concerned there is no signs of active infection at this time which is great news and very pleased in that regard. 10-09-2022 upon evaluation today patient appears to be doing well currently in regard to her heel we are definitely seeing signs of improvement which is great news and in general I think that we are moving in the right direction. I do not see any evidence of worsening overall which is great news. 7/25; pressure ulcer at the tip of her heel she has been using Promogran and sit to foot with  a border foam. She is a type II diabetic. She is attempting to religiously offload this area with Prevalon boots at night open heeled shoes during the day. Her measurements are a lot better today per our intake nurse 11-03-2022 upon evaluation today patient actually has not been there for 3 weeks is a combination of a couple missed appointments on her end and last week she had to cancel because of weather. Either way her foot seems to be doing well in regard to the heel on  the left lateral area. This is not showing signs of any obvious infection and at this point I think that she is making headway towards getting this closed although I do believe there is quite a bit of slough and biofilm skin need to be cleaned off just a little bit of callus around the edges of the wound. 11-10-2022 upon evaluation today patient appears to be doing well currently in regard to her heel ulcer. She has been tolerating the dressing changes without complication and in general I feel like she is making excellent headway towards complete closure. 8/27; patient has a wound on the left heel near the tip. Using Promogran and Zituvimet. Comes in with open heeled shoes however it has a lip that [name], [name] ([id number]) [id number] Page 9 of 11 encompasses the wound area which is on the distal part of the Achilles area. 12-08-2022 upon evaluation today patient appears to be doing well currently in regard to her wound on the foot which is actually doing excellent. This heel is measuring about 60% smaller than last time I saw her. Fortunately I think that she is really doing quite well at this time. 01-11-2023 upon evaluation today patient appears to be doing well currently in regard to her wound. She has been tolerating the dressing changes without complication. With that being said her heel actually seems to be doing well. She is inquiring as to whether or not she can do her 2-week follow-up. 01-26-2023 upon evaluation today patient appears to be doing well currently in regard to her heel ulcer I am pleased in that regard. Unfortunately she has a skin tear of the right lateral forearm which occurred as a result of a fall Sunday night. She tells me that she has been having issues here since that time with bruising and discomfort she is on Eliquis  so the bruising is not unexpected. With that being said I do believe that she is going to require something to keep this from  sticking to the wound and allowing it to continue to heal I think that skin to be appropriate I discussed with her options today as well. 03-08-2023 upon evaluation today patient appears to be doing well currently in regard to her heel and her arm. The arm appears to be completely healed which is great news that he will is significantly improved with getting very close to complete resolution. There is some need for sharp debridement today. 04-05-2023 upon evaluation today patient appears to be doing well currently in regard to her heel. She has been tolerating the dressing changes without complication. Fortunately I do not see any evidence of active infection at this time which is great news. Objective Constitutional Well-nourished and well-hydrated in no acute distress. Vitals Time Taken: 1:00 PM, Height: 64 in, Weight: 250 lbs, BMI: 42.9, Temperature: 98.3 F, Pulse: 62 bpm, Respiratory Rate: 18 breaths/min, Blood Pressure: 142/89 mmHg. Respiratory normal breathing without difficulty. Psychiatric this patient is able to make decisions and demonstrates  good insight into disease process. Alert and Oriented x 3. pleasant and cooperative. General Notes: Upon inspection patient's wound bed did require sharp debridement clearway necrotic debris and she tolerated the debridement today without complication. Postdebridement wound bed appears to be doing significantly better which is also news. Integumentary (Hair, Skin) Wound #24 status is Open. Original cause of wound was Gradually Appeared. The date acquired was: 08/22/2022. The wound has been in treatment 26 weeks. The wound is located on the Left Calcaneus. The wound measures 0.2cm length x 0.2cm width x 0.1cm depth; 0.031cm^2 area and 0.003cm^3 volume. There is Fat Layer (Subcutaneous Dawn) exposed. There is a small amount of serous drainage noted. The wound margin is distinct with the outline attached to the wound base. There is small (1-33%) red  granulation within the wound bed. There is a medium (34-66%) amount of necrotic Dawn within the wound bed including Adherent Slough and Necrosis of Bone. Assessment Active Problems ICD-10 Type 2 diabetes mellitus with foot ulcer Chronic venous hypertension (idiopathic) with inflammation of bilateral lower extremity Pressure ulcer of left heel, stage 3 Laceration without foreign body of right forearm, initial encounter Pressure-induced deep Dawn damage of right heel Long term (current) use of anticoagulants Paroxysmal atrial fibrillation Procedures Wound #24 Pre-procedure diagnosis of Wound #24 is a Pressure Ulcer located on the Left Calcaneus . There was a Excisional Skin/Subcutaneous Dawn Debridement with a total area of 0.03 sq cm performed by Bethena Ferraris, PA-C. With the following instrument(s): Curette to remove Viable and Non-Viable Dawn/material. Material removed includes Callus, Subcutaneous Dawn, and Slough. A time out was conducted at 13:25, prior to the start of the procedure. A Minimum amount of bleeding was controlled with Pressure. The procedure was tolerated well. Post Debridement Measurements: 0.2cm length x 0.2cm width x 0.1cm depth; 0.003cm^3 volume. Post debridement Stage noted as Category/Stage III. Character of Wound/Ulcer Post Debridement is stable. Post procedure Diagnosis Wound #24: Same as Pre-Procedure [name], [name] ([id number]) [phone] Page 10 of 11 Plan Follow-up Appointments: Return Appointment in 2 weeks. Anesthetic (Use 'Patient Medications' Section for Anesthetic Order Entry): Lidocaine  applied to wound bed Edema Control - Orders / Instructions: Patient to wear own compression stockings. Remove compression stockings every night before going to bed and put on every morning when getting up. Elevate, Exercise Daily and Avoid Standing for Long Periods of Time. WOUND #24: - Calcaneus Wound Laterality: Left Cleanser:  Normal Saline Every Other Day/30 Days Discharge Instructions: Wash your hands with soap and water . Remove old dressing, discard into plastic bag and place into trash. Cleanse the wound with Normal Saline prior to applying a clean dressing using gauze sponges, not tissues or cotton balls. Do not scrub or use excessive force. Pat dry using gauze sponges, not Dawn or cotton balls. Cleanser: Soap and Water  Every Other Day/30 Days Discharge Instructions: Gently cleanse wound with antibacterial soap, rinse and pat dry prior to dressing wounds Prim Dressing: Prisma 4.34 (in) Every Other Day/30 Days ary Discharge Instructions: Moisten w/normal saline or sterile water ; Cover wound as directed. Do not remove from wound bed. Secondary Dressing: Coverlet Latex-Free Fabric Adhesive Dressings Every Other Day/30 Days Discharge Instructions: 1.5 x 2 1. I would recommend that we have the patient continue to utilize the wound care measures as before that is the collagen particularly this seems to be doing quite well. 2. I would recommend we continue with the coverlet over top of this. 3. She should continue to offload is much as possible. She also needs  to show up regularly for her appointments. We will see patient back for reevaluation in 2 weeks here in the clinic. If anything worsens or changes patient will contact our office for additional recommendations. Electronic Signature(s) Signed: 04/05/2023 2:41:12 PM By: Bethena Ferraris PA-C Entered By: Bethena Ferraris on 04/05/2023 14:41:12 -------------------------------------------------------------------------------- SuperBill Details Patient Name: Date of Service: [name], [name] 04/05/2023 Medical Record Number: [id number] Patient Account Number: [id number] Date of Birth/Sex: Treating RN: 1950-04-25 (73 y.o. [name] [name] Primary Care Provider: Gretel App Other Clinician: Referring Provider: Treating Provider/Extender: Bethena Ferraris Gretel App Weeks in  Treatment: 26 Diagnosis Coding ICD-10 Codes Code Description E11.621 Type 2 diabetes mellitus with foot ulcer I87.323 Chronic venous hypertension (idiopathic) with inflammation of bilateral lower extremity L89.623 Pressure ulcer of left heel, stage 3 S51.811A Laceration without foreign body of right forearm, initial encounter L89.616 Pressure-induced deep Dawn damage of right heel Z79.01 Long term (current) use of anticoagulants I48.0 Paroxysmal atrial fibrillation Facility Procedures : [name], [name] Code: [id number] [name] ([id number]) ICD L Description: 11042 - DEB SUBQ Dawn 20 SQ CM/< [id number] Diagnosis Description 89.623 Pressure ulcer of left heel, stage 3 Modifier: [email or web address] Page 11 Quantity: 1 of 11 Physician Procedures : CPT4 Code Description Modifier 11042 11042 - WC PHYS SUBQ TISS 20 SQ CM ICD-10 Diagnosis Description L89.623 Pressure ulcer of left heel, stage 3 Quantity: 1 Electronic Signature(s) Signed: 04/05/2023 2:46:21 PM By: Bethena Ferraris PA-C Entered By: Bethena Ferraris on 04/05/2023 14:46:21

## 2023-04-07 NOTE — Patient Instructions (Signed)
 Iron Sucrose Injection What is this medication? IRON SUCROSE (EYE ern SOO krose) treats low levels of iron (iron deficiency anemia) in people with kidney disease. Iron is a mineral that plays an important role in making red blood cells, which carry oxygen from your lungs to the rest of your body. This medicine may be used for other purposes; ask your health care provider or pharmacist if you have questions. COMMON BRAND NAME(S): Venofer What should I tell my care team before I take this medication? They need to know if you have any of these conditions: Anemia not caused by low iron levels Heart disease High levels of iron in the blood Kidney disease Liver disease An unusual or allergic reaction to iron, other medications, foods, dyes, or preservatives Pregnant or trying to get pregnant Breastfeeding How should I use this medication? This medication is for infusion into a vein. It is given in a hospital or clinic setting. Talk to your care team about the use of this medication in children. While this medication may be prescribed for children as young as 2 years for selected conditions, precautions do apply. Overdosage: If you think you have taken too much of this medicine contact a poison control center or emergency room at once. NOTE: This medicine is only for you. Do not share this medicine with others. What if I miss a dose? Keep appointments for follow-up doses. It is important not to miss your dose. Call your care team if you are unable to keep an appointment. What may interact with this medication? Do not take this medication with any of the following: Deferoxamine Dimercaprol Other iron products This medication may also interact with the following: Chloramphenicol Deferasirox This list may not describe all possible interactions. Give your health care provider a list of all the medicines, herbs, non-prescription drugs, or dietary supplements you use. Also tell them if you smoke,  drink alcohol, or use illegal drugs. Some items may interact with your medicine. What should I watch for while using this medication? Visit your care team regularly. Tell your care team if your symptoms do not start to get better or if they get worse. You may need blood work done while you are taking this medication. You may need to follow a special diet. Talk to your care team. Foods that contain iron include: whole grains/cereals, dried fruits, beans, or peas, leafy green vegetables, and organ meats (liver, kidney). What side effects may I notice from receiving this medication? Side effects that you should report to your care team as soon as possible: Allergic reactions--skin rash, itching, hives, swelling of the face, lips, tongue, or throat Low blood pressure--dizziness, feeling faint or lightheaded, blurry vision Shortness of breath Side effects that usually do not require medical attention (report to your care team if they continue or are bothersome): Flushing Headache Joint pain Muscle pain Nausea Pain, redness, or irritation at injection site This list may not describe all possible side effects. Call your doctor for medical advice about side effects. You may report side effects to FDA at 1-800-FDA-1088. Where should I keep my medication? This medication is given in a hospital or clinic. It will not be stored at home. NOTE: This sheet is a summary. It may not cover all possible information. If you have questions about this medicine, talk to your doctor, pharmacist, or health care provider.  2024 Elsevier/Gold Standard (2022-08-14 00:00:00)

## 2023-04-15 ENCOUNTER — Telehealth: Payer: Self-pay

## 2023-04-15 DIAGNOSIS — J441 Chronic obstructive pulmonary disease with (acute) exacerbation: Secondary | ICD-10-CM

## 2023-04-15 DIAGNOSIS — J4540 Moderate persistent asthma, uncomplicated: Secondary | ICD-10-CM

## 2023-04-15 NOTE — Addendum Note (Signed)
Addended by: [name] on: 04/15/2023 10:56 AM   Modules accepted: Orders

## 2023-04-15 NOTE — Telephone Encounter (Signed)
Pended DME order for nebulizer will fax to [facility] once printed and signed

## 2023-04-15 NOTE — Addendum Note (Signed)
Addended by: Donavan Foil on: 04/15/2023 10:16 AM   Modules accepted: Orders

## 2023-04-15 NOTE — Telephone Encounter (Signed)
DME order faxed to [facility] at [phone] with a completed transmission log

## 2023-04-15 NOTE — Telephone Encounter (Signed)
Copied from CRM [id number]. Topic: General - Other >> Apr 14, 2023  3:59 PM Kathryne Eriksson wrote: Reason for CRM: Nebulizer Machine >> Apr 14, 2023  4:00 PM Kathryne Eriksson wrote: Patient states her nebulizer machine broke and she's needing a new one. Patient is wanting to know if she could have one called into the pharmacy and her insurance covers the cost of it.

## 2023-04-18 DIAGNOSIS — G4733 Obstructive sleep apnea (adult) (pediatric): Secondary | ICD-10-CM | POA: Diagnosis not present

## 2023-04-18 DIAGNOSIS — G4700 Insomnia, unspecified: Secondary | ICD-10-CM | POA: Diagnosis not present

## 2023-04-18 DIAGNOSIS — Z8673 Personal history of transient ischemic attack (TIA), and cerebral infarction without residual deficits: Secondary | ICD-10-CM | POA: Diagnosis not present

## 2023-04-18 DIAGNOSIS — I10 Essential (primary) hypertension: Secondary | ICD-10-CM | POA: Diagnosis not present

## 2023-04-19 ENCOUNTER — Encounter: Payer: Medicare HMO | Admitting: Physician Assistant

## 2023-04-19 DIAGNOSIS — Z7901 Long term (current) use of anticoagulants: Secondary | ICD-10-CM | POA: Diagnosis not present

## 2023-04-19 DIAGNOSIS — L89616 Pressure-induced deep tissue damage of right heel: Secondary | ICD-10-CM | POA: Diagnosis not present

## 2023-04-19 DIAGNOSIS — I890 Lymphedema, not elsewhere classified: Secondary | ICD-10-CM | POA: Diagnosis not present

## 2023-04-19 DIAGNOSIS — M109 Gout, unspecified: Secondary | ICD-10-CM | POA: Diagnosis not present

## 2023-04-19 DIAGNOSIS — E1140 Type 2 diabetes mellitus with diabetic neuropathy, unspecified: Secondary | ICD-10-CM | POA: Diagnosis not present

## 2023-04-19 DIAGNOSIS — I10 Essential (primary) hypertension: Secondary | ICD-10-CM | POA: Diagnosis not present

## 2023-04-19 DIAGNOSIS — E11621 Type 2 diabetes mellitus with foot ulcer: Secondary | ICD-10-CM | POA: Diagnosis not present

## 2023-04-19 DIAGNOSIS — L89623 Pressure ulcer of left heel, stage 3: Secondary | ICD-10-CM | POA: Diagnosis not present

## 2023-04-19 DIAGNOSIS — Z86718 Personal history of other venous thrombosis and embolism: Secondary | ICD-10-CM | POA: Diagnosis not present

## 2023-04-19 DIAGNOSIS — I480 Paroxysmal atrial fibrillation: Secondary | ICD-10-CM | POA: Diagnosis not present

## 2023-04-19 DIAGNOSIS — I872 Venous insufficiency (chronic) (peripheral): Secondary | ICD-10-CM | POA: Diagnosis not present

## 2023-04-19 DIAGNOSIS — S51811A Laceration without foreign body of right forearm, initial encounter: Secondary | ICD-10-CM | POA: Diagnosis not present

## 2023-04-19 DIAGNOSIS — Z96612 Presence of left artificial shoulder joint: Secondary | ICD-10-CM | POA: Diagnosis not present

## 2023-04-19 DIAGNOSIS — I87323 Chronic venous hypertension (idiopathic) with inflammation of bilateral lower extremity: Secondary | ICD-10-CM | POA: Diagnosis not present

## 2023-04-19 DIAGNOSIS — M1990 Unspecified osteoarthritis, unspecified site: Secondary | ICD-10-CM | POA: Diagnosis not present

## 2023-04-19 IMAGING — MR MR SHOULDER*L* W/O CM
5 of 6 series · 31 of 40 positions shown · non-contrast
Comparison: CT upper extremity 12/21/2019.

CLINICAL DATA: Left shoulder pain with limited range of motion
since stroke in 8774. Rotator cuff disorder suspected.

EXAM:
MRI OF THE LEFT SHOULDER WITHOUT CONTRAST
TECHNIQUE: Multiplanar, multisequence MR imaging of the shoulder was performed.
No intravenous contrast was administered.

[Series 9: T2 fat-sat · axial · left · 4.0mm · 0.44mm/px · z∈[-18,+98]mm · 7 of 26 slices shown (1 of 4)]
[im 1/26]
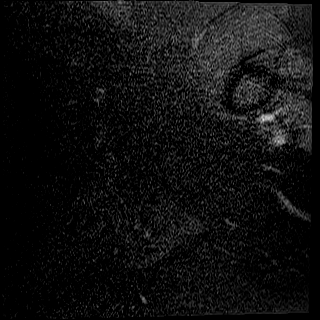
[im 5/26]
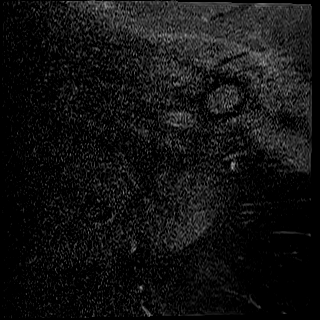
[im 9/26]
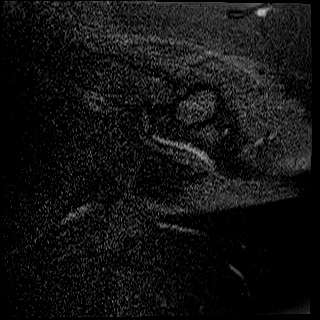
[im 13/26]
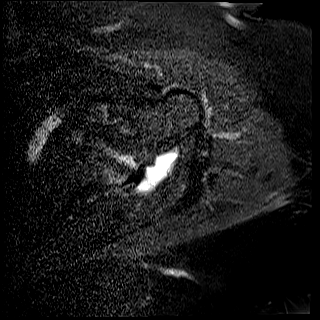
[im 17/26]
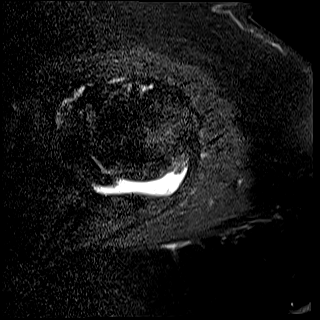
[im 21/26]
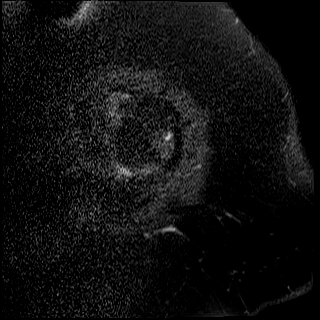
[im 26/26]
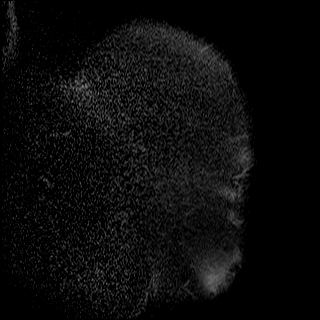

[Series 10: PD · coronal · left · 4.0mm · 0.44mm/px · 7 of 26 slices shown]
[im 1/26]
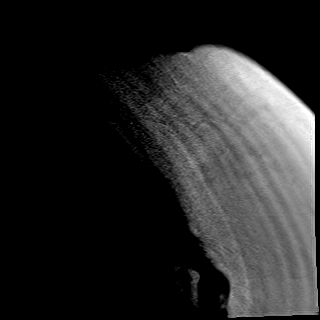
[im 5/26]
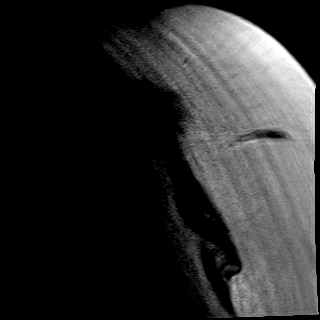
[im 9/26]
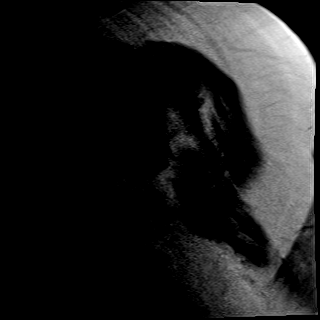
[im 13/26]
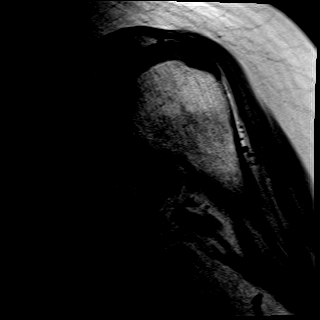
[im 17/26]
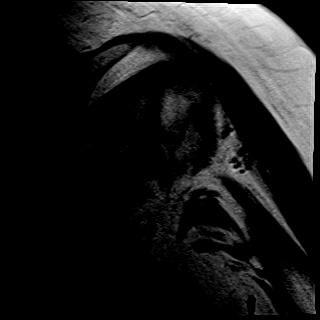
[im 21/26]
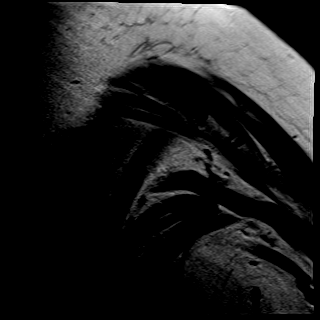
[im 26/26]
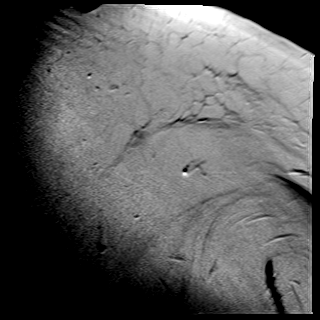

[Series 11: T2 fat-sat · coronal · left · 4.0mm · 0.44mm/px · 7 of 26 slices shown (2 of 4)]
[im 1/26]
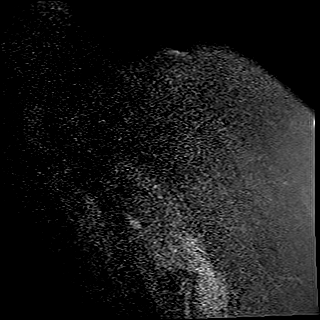
[im 5/26]
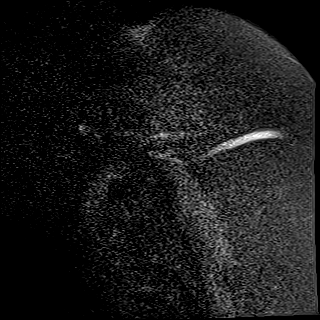
[im 9/26]
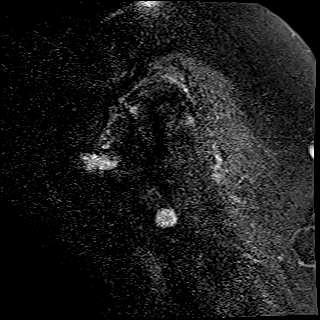
[im 13/26]
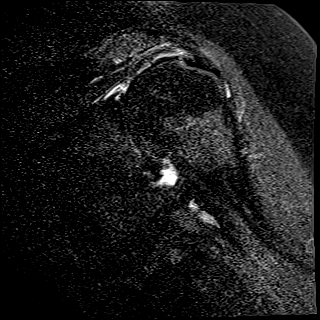
[im 17/26]
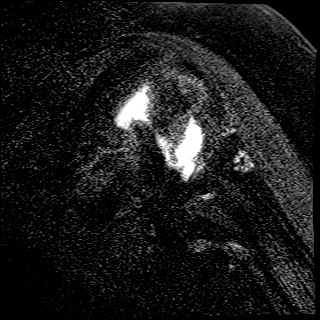
[im 21/26]
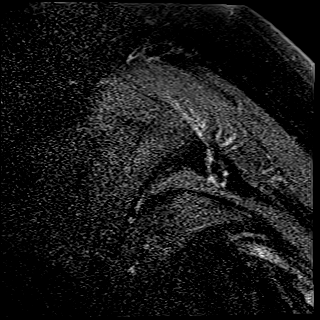
[im 26/26]
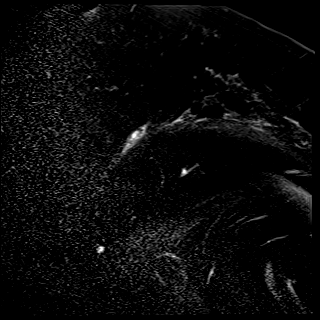

[Series 12: T2 fat-sat · oblique · left · 4.0mm · 0.22mm/px · 6 of 22 slices shown (3 of 4)]
[im 1/22]
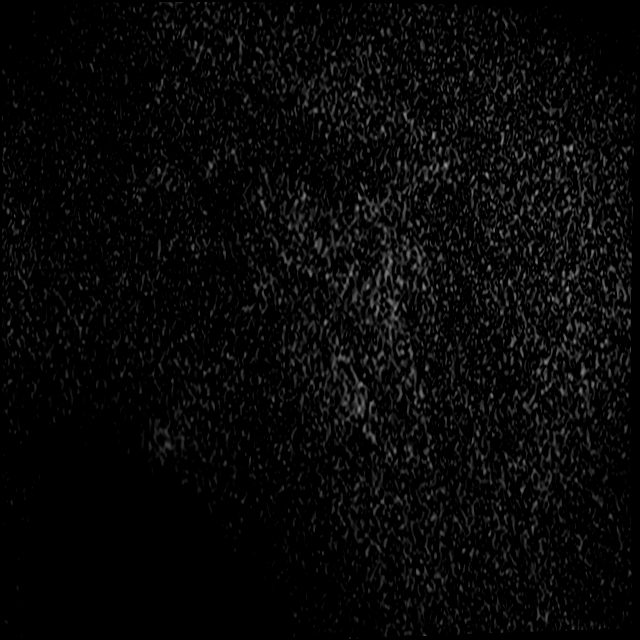
[im 5/22]
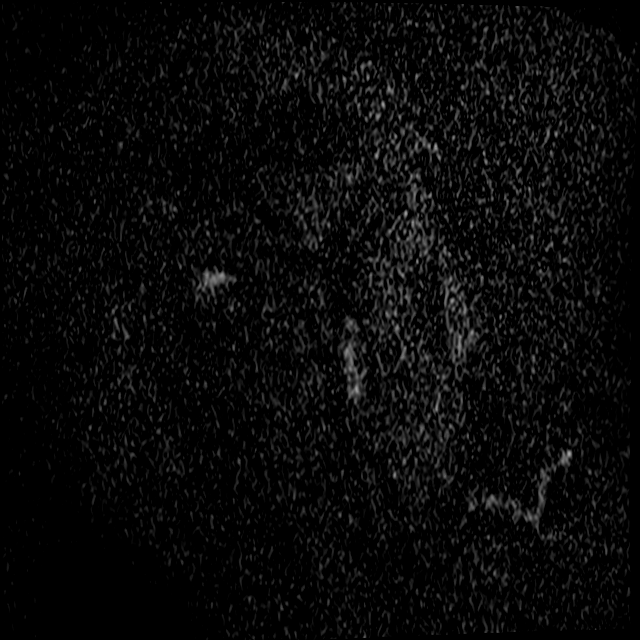
[im 9/22]
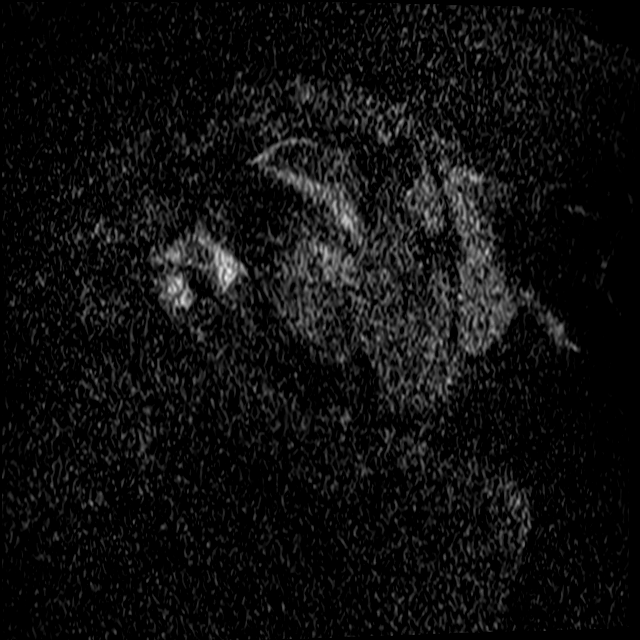
[im 13/22]
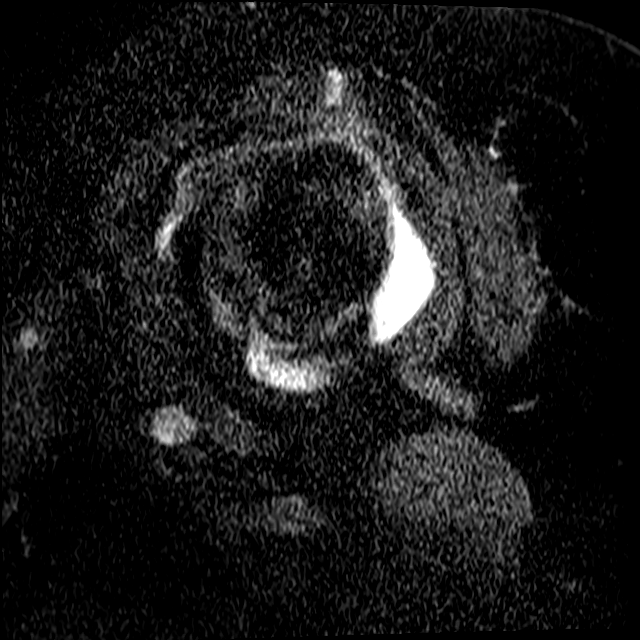
[im 17/22]
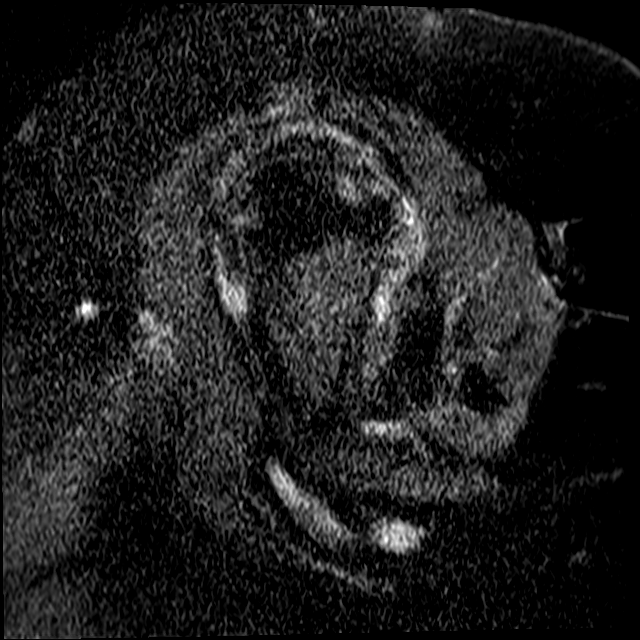
[im 22/22]
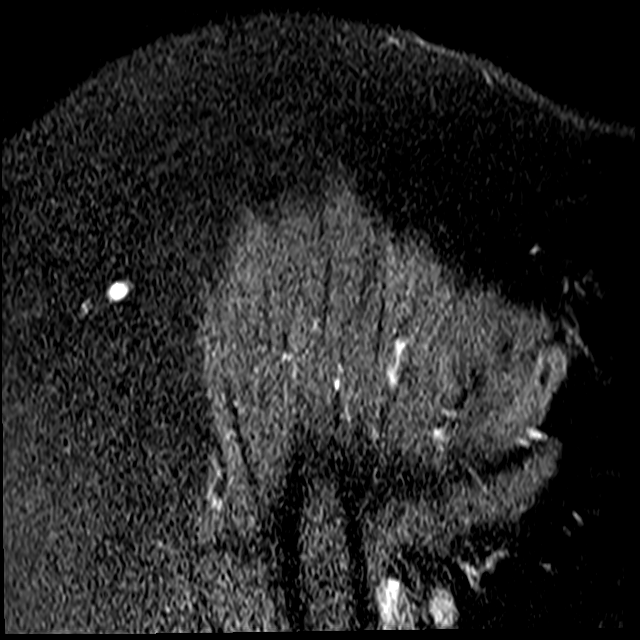

[Series 19: T2 fat-sat · axial · left · 4.0mm · 0.44mm/px · z∈[+13,+67]mm · 4 of 26 slices shown (4 of 4)]
[im 1/26]
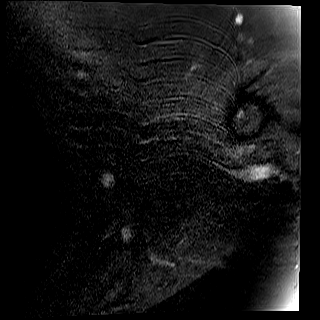
[im 5/26]
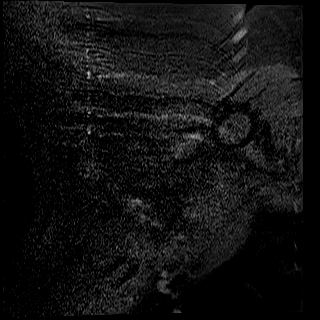
[im 9/26]
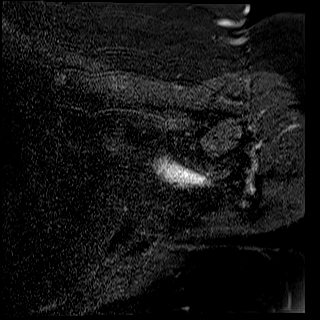
[im 13/26]
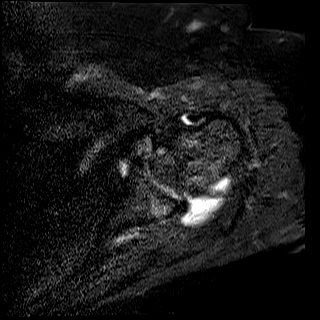

[31 of 40 positions shown; findings below may reference images not displayed]

FINDINGS: Limited evaluation secondary to large body habitus and patient
motion degrading image quality.

Rotator cuff: Moderate tendinosis of the supraspinatus tendon with a
small partial-thickness articular surface tear. Infraspinatus tendon
is intact. Teres minor tendon is intact. Subscapularis tendon is
intact.

Muscles: No atrophy or abnormal signal of the muscles of the rotator
cuff.

Biceps long head: Mild tendinosis of the intra-articular portion of
the long head of the biceps tendon.

Acromioclavicular Joint: Os acromiale. Small amount of fluid across
the synchondrosis. Mild arthropathy of the acromioclavicular joint.
No subacromial/subdeltoid bursal fluid.

Glenohumeral Joint: Small joint effusion. Full-thickness cartilage
loss of the glenohumeral joint with marginal osteophytes.

Labrum: Grossly intact, but evaluation is limited by lack of
intraarticular fluid.

Bones:  No acute osseous abnormality.  No aggressive osseous lesion.

Other: None.
IMPRESSION: 1. Severe osteoarthritis of the left glenohumeral joint.
2. Moderate tendinosis of the supraspinatus tendon with a small
partial-thickness articular surface tear.

## 2023-04-30 ENCOUNTER — Encounter: Payer: Self-pay | Admitting: Oncology

## 2023-05-03 ENCOUNTER — Ambulatory Visit: Payer: Medicare HMO | Admitting: Physician Assistant

## 2023-05-06 ENCOUNTER — Other Ambulatory Visit: Payer: Self-pay

## 2023-05-10 ENCOUNTER — Ambulatory Visit: Payer: Medicare HMO | Admitting: Physician Assistant

## 2023-05-20 ENCOUNTER — Encounter: Payer: Medicare HMO | Attending: Physician Assistant | Admitting: Physician Assistant

## 2023-05-20 DIAGNOSIS — I480 Paroxysmal atrial fibrillation: Secondary | ICD-10-CM | POA: Insufficient documentation

## 2023-05-20 DIAGNOSIS — L89616 Pressure-induced deep tissue damage of right heel: Secondary | ICD-10-CM | POA: Diagnosis not present

## 2023-05-20 DIAGNOSIS — Z7901 Long term (current) use of anticoagulants: Secondary | ICD-10-CM | POA: Diagnosis not present

## 2023-05-20 DIAGNOSIS — S51811A Laceration without foreign body of right forearm, initial encounter: Secondary | ICD-10-CM | POA: Insufficient documentation

## 2023-05-20 DIAGNOSIS — I87323 Chronic venous hypertension (idiopathic) with inflammation of bilateral lower extremity: Secondary | ICD-10-CM | POA: Diagnosis not present

## 2023-05-20 DIAGNOSIS — X58XXXA Exposure to other specified factors, initial encounter: Secondary | ICD-10-CM | POA: Diagnosis not present

## 2023-05-20 DIAGNOSIS — E11621 Type 2 diabetes mellitus with foot ulcer: Secondary | ICD-10-CM | POA: Insufficient documentation

## 2023-05-20 DIAGNOSIS — L89623 Pressure ulcer of left heel, stage 3: Secondary | ICD-10-CM | POA: Insufficient documentation

## 2023-05-26 DIAGNOSIS — Z008 Encounter for other general examination: Secondary | ICD-10-CM | POA: Diagnosis not present

## 2023-06-03 ENCOUNTER — Ambulatory Visit: Payer: Medicare HMO | Admitting: Physician Assistant

## 2023-06-08 ENCOUNTER — Encounter: Attending: Physician Assistant | Admitting: Physician Assistant

## 2023-06-08 ENCOUNTER — Other Ambulatory Visit: Payer: Self-pay | Admitting: Cardiovascular Disease

## 2023-06-08 DIAGNOSIS — Z7901 Long term (current) use of anticoagulants: Secondary | ICD-10-CM | POA: Insufficient documentation

## 2023-06-08 DIAGNOSIS — I87323 Chronic venous hypertension (idiopathic) with inflammation of bilateral lower extremity: Secondary | ICD-10-CM | POA: Diagnosis not present

## 2023-06-08 DIAGNOSIS — E11621 Type 2 diabetes mellitus with foot ulcer: Secondary | ICD-10-CM | POA: Insufficient documentation

## 2023-06-08 DIAGNOSIS — I480 Paroxysmal atrial fibrillation: Secondary | ICD-10-CM

## 2023-06-08 DIAGNOSIS — S51811A Laceration without foreign body of right forearm, initial encounter: Secondary | ICD-10-CM | POA: Insufficient documentation

## 2023-06-08 DIAGNOSIS — L89616 Pressure-induced deep tissue damage of right heel: Secondary | ICD-10-CM | POA: Diagnosis not present

## 2023-06-08 DIAGNOSIS — X58XXXA Exposure to other specified factors, initial encounter: Secondary | ICD-10-CM | POA: Insufficient documentation

## 2023-06-08 DIAGNOSIS — L89623 Pressure ulcer of left heel, stage 3: Secondary | ICD-10-CM | POA: Diagnosis not present

## 2023-06-08 NOTE — Telephone Encounter (Signed)
 Eliquis 5mg  refill request received. Patient is 73 years old, weight-111.1kg, Crea-1.51 on 01/20/23, Diagnosis-Afib & DVT, and last seen by Dr. Kirke Corin on 02/02/23. Dose is appropriate based on dosing criteria. Will send in refill to requested pharmacy.

## 2023-06-23 ENCOUNTER — Ambulatory Visit: Admitting: Physician Assistant

## 2023-06-24 ENCOUNTER — Encounter: Attending: Physician Assistant | Admitting: Physician Assistant

## 2023-06-24 DIAGNOSIS — Z7901 Long term (current) use of anticoagulants: Secondary | ICD-10-CM | POA: Diagnosis not present

## 2023-06-24 DIAGNOSIS — E119 Type 2 diabetes mellitus without complications: Secondary | ICD-10-CM | POA: Diagnosis not present

## 2023-06-24 DIAGNOSIS — I480 Paroxysmal atrial fibrillation: Secondary | ICD-10-CM | POA: Insufficient documentation

## 2023-06-24 DIAGNOSIS — I87323 Chronic venous hypertension (idiopathic) with inflammation of bilateral lower extremity: Secondary | ICD-10-CM | POA: Diagnosis not present

## 2023-06-24 DIAGNOSIS — X58XXXA Exposure to other specified factors, initial encounter: Secondary | ICD-10-CM | POA: Diagnosis not present

## 2023-06-24 DIAGNOSIS — L89616 Pressure-induced deep tissue damage of right heel: Secondary | ICD-10-CM | POA: Diagnosis not present

## 2023-06-24 DIAGNOSIS — L89623 Pressure ulcer of left heel, stage 3: Secondary | ICD-10-CM | POA: Insufficient documentation

## 2023-06-24 DIAGNOSIS — S51811A Laceration without foreign body of right forearm, initial encounter: Secondary | ICD-10-CM | POA: Insufficient documentation

## 2023-06-29 DIAGNOSIS — E113293 Type 2 diabetes mellitus with mild nonproliferative diabetic retinopathy without macular edema, bilateral: Secondary | ICD-10-CM | POA: Diagnosis not present

## 2023-06-29 DIAGNOSIS — H524 Presbyopia: Secondary | ICD-10-CM | POA: Diagnosis not present

## 2023-06-29 DIAGNOSIS — H52223 Regular astigmatism, bilateral: Secondary | ICD-10-CM | POA: Diagnosis not present

## 2023-06-29 DIAGNOSIS — H2513 Age-related nuclear cataract, bilateral: Secondary | ICD-10-CM | POA: Diagnosis not present

## 2023-06-29 LAB — HM DIABETES EYE EXAM

## 2023-06-29 IMAGING — CR DG CHEST 2V
1 series · 2 of 2 positions shown · non-contrast
Comparison: 10/31/2020

CLINICAL DATA: Cough, asthma, shortness of breath

EXAM:
CHEST - 2 VIEW

[Series 1: dg chest 2 view · 0.14mm/px · 2 of 2 slices shown]
[im 1/2]
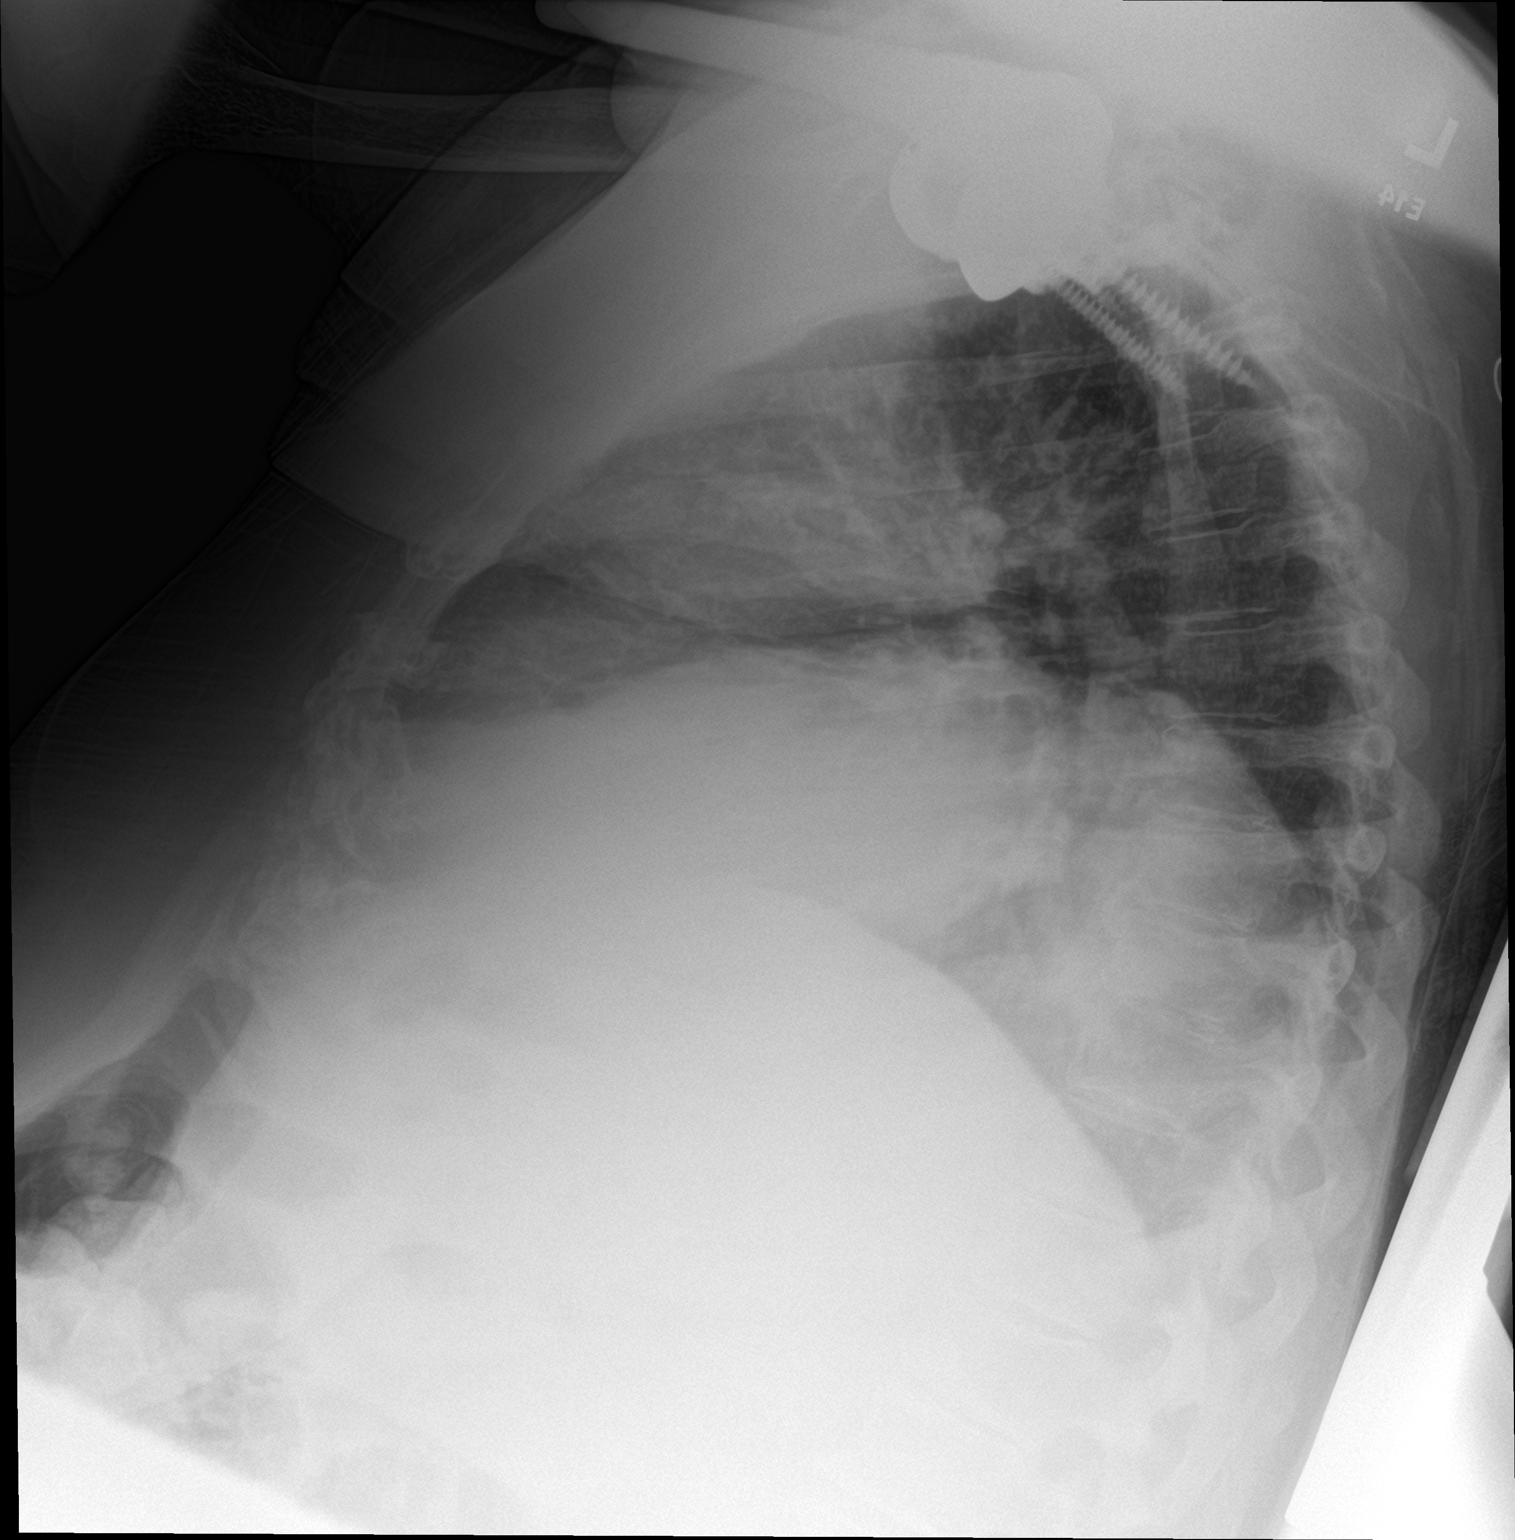
[im 2/2]
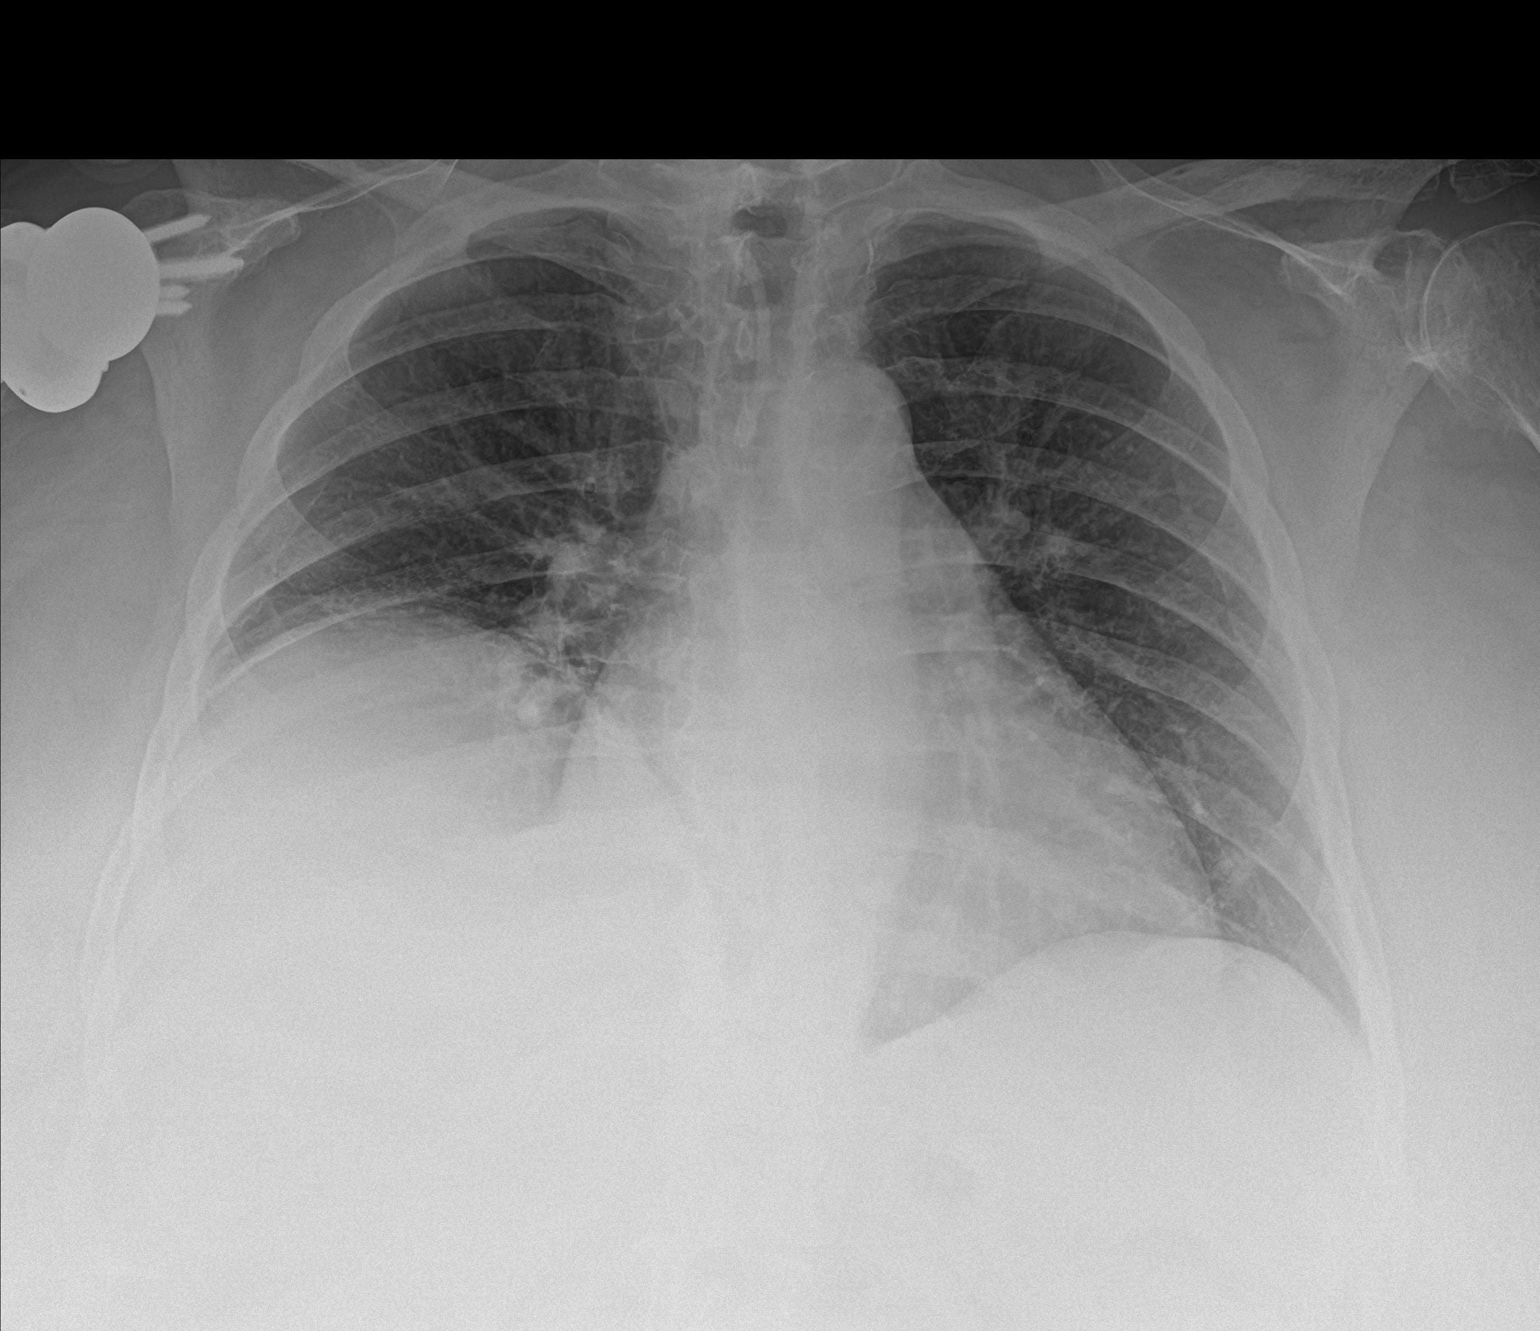

[2 of 2 positions shown; findings below may reference images not displayed]

FINDINGS: Transverse diameter of heart is increased. Right hemidiaphragm is
elevated which may be due to eventration or paralysis. There are
linear densities in the right lower lung fields. There are no signs
of alveolar pulmonary edema or new focal pulmonary consolidation.
There is no pleural effusion or pneumothorax. There is previous
right shoulder arthroplasty.
IMPRESSION: Cardiomegaly. Linear densities in the right lower lung fields
suggest scarring or subsegmental atelectasis. There are no signs of
pulmonary edema or new focal infiltrates.

## 2023-07-01 ENCOUNTER — Other Ambulatory Visit: Payer: Self-pay | Admitting: Obstetrics and Gynecology

## 2023-07-01 ENCOUNTER — Other Ambulatory Visit: Payer: Self-pay | Admitting: Nurse Practitioner

## 2023-07-01 ENCOUNTER — Other Ambulatory Visit: Payer: Self-pay | Admitting: Cardiovascular Disease

## 2023-07-01 DIAGNOSIS — I5033 Acute on chronic diastolic (congestive) heart failure: Secondary | ICD-10-CM

## 2023-07-01 DIAGNOSIS — E1165 Type 2 diabetes mellitus with hyperglycemia: Secondary | ICD-10-CM

## 2023-07-01 DIAGNOSIS — E1142 Type 2 diabetes mellitus with diabetic polyneuropathy: Secondary | ICD-10-CM

## 2023-07-01 DIAGNOSIS — N390 Urinary tract infection, site not specified: Secondary | ICD-10-CM

## 2023-07-05 DIAGNOSIS — Z96612 Presence of left artificial shoulder joint: Secondary | ICD-10-CM | POA: Diagnosis not present

## 2023-07-08 ENCOUNTER — Ambulatory Visit: Admitting: Physician Assistant

## 2023-07-14 ENCOUNTER — Telehealth: Payer: Self-pay

## 2023-07-14 ENCOUNTER — Other Ambulatory Visit: Payer: Self-pay | Admitting: Nurse Practitioner

## 2023-07-14 DIAGNOSIS — B3731 Acute candidiasis of vulva and vagina: Secondary | ICD-10-CM

## 2023-07-14 MED ORDER — FLUCONAZOLE 150 MG PO TABS: ORAL_TABLET | ORAL | 1 refills | Status: DC

## 2023-07-14 NOTE — Telephone Encounter (Signed)
 Copied from CRM [id number]. Topic: Clinical - Medical Advice >> Jul 14, 2023  1:38 PM Adaysia C wrote: Reason for CRM: Patient has asked if the provider can prescribe her Diflucan  because she has a really bad yeast infection because of the diabetes medication she takes; please follow up with patient [phone]

## 2023-07-21 ENCOUNTER — Encounter: Payer: Self-pay | Admitting: Nurse Practitioner

## 2023-07-21 ENCOUNTER — Ambulatory Visit: Admitting: Nurse Practitioner

## 2023-07-21 VITALS — BP 100/68 | HR 76 | Temp 98.2°F | Ht 64.0 in | Wt 228.6 lb

## 2023-07-21 DIAGNOSIS — Z7984 Long term (current) use of oral hypoglycemic drugs: Secondary | ICD-10-CM | POA: Diagnosis not present

## 2023-07-21 DIAGNOSIS — I10 Essential (primary) hypertension: Secondary | ICD-10-CM

## 2023-07-21 DIAGNOSIS — E1142 Type 2 diabetes mellitus with diabetic polyneuropathy: Secondary | ICD-10-CM | POA: Diagnosis not present

## 2023-07-21 DIAGNOSIS — B372 Candidiasis of skin and nail: Secondary | ICD-10-CM | POA: Diagnosis not present

## 2023-07-21 DIAGNOSIS — Z96612 Presence of left artificial shoulder joint: Secondary | ICD-10-CM

## 2023-07-21 DIAGNOSIS — E039 Hypothyroidism, unspecified: Secondary | ICD-10-CM

## 2023-07-21 DIAGNOSIS — E1165 Type 2 diabetes mellitus with hyperglycemia: Secondary | ICD-10-CM

## 2023-07-21 DIAGNOSIS — Z7985 Long-term (current) use of injectable non-insulin antidiabetic drugs: Secondary | ICD-10-CM

## 2023-07-21 MED ORDER — EMPAGLIFLOZIN 10 MG PO TABS: 10.0000 mg | ORAL_TABLET | Freq: Every day | ORAL | 3 refills | Status: AC

## 2023-07-21 MED ORDER — NYSTATIN 100000 UNIT/GM EX POWD: 1.0000 | Freq: Three times a day (TID) | CUTANEOUS | 5 refills | Status: DC | PRN

## 2023-07-21 NOTE — Progress Notes (Unsigned)
 Dawn Burkitt, NP-C Phone: [phone]  [name] is a 73 y.o. adult who presents today for follow up.   Discussed the use of AI scribe software for clinical note transcription with the patient, who gave verbal consent to proceed.  History of Present Illness   [name] is a 73 year old female with diabetes who presents for a follow-up visit.  Approximately seven weeks ago, she experienced a fall due to uneven ground near her home, leading to a decrease in her walking speed. She has since started using a wooden cane for support and is adjusting to its use, continuing daily activities at a slower pace.  She has ongoing shoulder pain, particularly when raising her arm, with discomfort in the bicep area. The pain subsides with limited shoulder use. Despite completing physical therapy, she continues to experience discomfort.  Her diabetes management has improved significantly, with her A1c dropping from 11 to 7.5, attributed to her medication regimen, including Mounjaro  and Jardiance . She experiences occasional hypoglycemic episodes, with nine occurrences in the past 30 days. Her average blood sugar over the last two weeks is 123, and over the last 30 days is 129.  She experiences recurrent skin yeast infections, which she associates with prolonged outdoor activity and the use of Jardiance . She manages these infections with nystatin  cream or powder.  Her diet includes yogurt and granola in the morning, and she tries to incorporate protein into her meals, such as grilled chicken salads. She acknowledges a need to increase her protein intake and is mindful of her dietary choices.  She has lost approximately 20 pounds over the past six months, currently weighing 228 pounds, down from 247 pounds, attributing this weight loss to her medication regimen and dietary changes.  No chest pain, shortness of breath, or dizziness. Her blood pressure is monitored regularly, and she is on a low dose  of losartan . She has a history of kidney issues but reports improvement in her urinary function.  Her mood is generally good, although she feels affected by her husband's health issues and her sister's depressive episodes. She tries to maintain a positive outlook.      Social History   Tobacco Use  Smoking Status Never  Smokeless Tobacco Never    Current Outpatient Medications on File Prior to Visit  Medication Sig Dispense Refill   acetaminophen  (TYLENOL ) 500 MG tablet Take 2 tablets (1,000 mg total) by mouth every 8 (eight) hours. 30 tablet 0   albuterol  (VENTOLIN  HFA) 108 (90 Base) MCG/ACT inhaler Inhale 2 puffs into the lungs every 6 (six) hours as needed. 8 g 4   ALPRAZolam  (XANAX ) 0.25 MG tablet Take 1 tablet (0.25 mg total) by mouth 2 (two) times daily as needed for anxiety. See SIG change was taking 1/2 of 0.5 tablet bid prn changed to 0.25 bid prn. D/c 0.5 xanax  60 tablet 2   buPROPion  (WELLBUTRIN  XL) 150 MG 24 hr tablet Take 1 tablet (150 mg total) by mouth daily. 90 tablet 3   calcium -vitamin D  (OSCAL WITH D) 500-200 MG-UNIT tablet Take 1 tablet by mouth 2 (two) times daily with a meal. 60 tablet 0   cephALEXin  (KEFLEX ) 250 MG capsule TAKE 1 CAPSULE BY MOUTH ONCE DAILY 90 capsule 0   clobetasol  cream (TEMOVATE ) 0.05 % Apply 1 application topically 2 (two) times daily as needed (skin irritation).      colchicine  0.6 MG tablet Take 1 tablet (0.6 mg total) by mouth daily. Repeat I 1 hour if  needed. No more than 3 tablets in 24 hours PRN for GOUT FLARE 30 tablet 0   Continuous Glucose Sensor (FREESTYLE LIBRE 3 PLUS SENSOR) MISC PLACE ONE SENSOR ON THE SKIN EVERY FOURTEEN DAYS.USE TO CHECK GLUCOSE CONTINUOUSLY 6 each 3   cyanocobalamin  (VITAMIN B12) 1000 MCG tablet Take 1,000 mcg by mouth daily.     diclofenac  Sodium (VOLTAREN ) 1 % GEL Apply 2 g topically 3 (three) times daily. 100 g 2   DULoxetine  (CYMBALTA ) 60 MG capsule Take 1 capsule (60 mg total) by mouth daily. 90 capsule 3    ELIQUIS  5 MG TABS tablet TAKE 1 TABLET BY MOUTH TWICE DAILY 180 tablet 1   ergocalciferol  (VITAMIN D2) 1.25 MG (50000 UT) capsule Take 50,000 Units by mouth once a week.     estradiol  (ESTRACE ) 0.1 MG/GM vaginal cream Apply 0.5mg  (pea-sized amount)  just inside the vaginal introitus with a finger-tip on Monday, Wednesday and Friday nights, 42.5 g 11   ferrous sulfate  324 MG TBEC Take 324 mg by mouth daily with breakfast.     fluconazole  (DIFLUCAN ) 150 MG tablet Take 1 tablet by mouth x 1 dose. May repeat in 72 hours if needed. 2 tablet 1   Fluticasone -Umeclidin-Vilant (TRELEGY ELLIPTA ) 200-62.5-25 MCG/ACT AEPB INHALE ONE PUFF INTO THE LUNGS EVERY DAY 60 each 5   folic acid  (FOLVITE ) 800 MCG tablet Take 800 mcg by mouth daily.     furosemide  (LASIX ) 40 MG tablet Take 1 tablet (40 mg total) by mouth as directed. Take 1 tablet daily. May take additional 1 tablet If weight gain of greater than 2 pounds in 24 hours or 5 pounds in one week. 90 tablet 3   glucose blood (ONETOUCH ULTRA) test strip TEST TWICE DAILY AS DIRECTED 100 each 11   ipratropium-albuterol  (DUONEB) 0.5-2.5 (3) MG/3ML SOLN Take 3 mLs by nebulization every 6 (six) hours as needed.     Lancets (ONETOUCH ULTRASOFT) lancets Use as instructed 100 each 12   levocetirizine (XYZAL ) 5 MG tablet TAKE ONE TABLET BY MOUTH AT BEDTIME AS NEEDED FOR ALLERGIES 90 tablet 3   levothyroxine  (SYNTHROID ) 137 MCG tablet Take 1 tablet (137 mcg total) by mouth daily before breakfast. 90 tablet 3   Loperamide -Simethicone  2-125 MG TABS Take 1 tablet by mouth daily as needed. May repeat in 1 hour     losartan  (COZAAR ) 25 MG tablet Take 1 tablet (25 mg total) by mouth daily. 90 tablet 3   Magnesium  ([employer] TRIPLE MAGNESIUM  COMPLEX) 400 MG CAPS Take 1 capsule by mouth daily.     Misc. Devices (TUB TRANSFER BOARD) MISC 1 Device by Does not apply route as needed. 1 each 0   montelukast  (SINGULAIR ) 10 MG tablet TAKE 1 TABLET BY MOUTH NIGHTLY 90 tablet 3   Multiple  Vitamins-Minerals (HAIR SKIN & NAILS) TABS Take 1 tablet by mouth daily.     naloxone  (NARCAN ) nasal spray 4 mg/0.1 mL Place 1 spray into the nose as needed for up to 365 doses (for opioid-induced respiratory depresssion). In case of emergency (overdose), spray once into each nostril. If no response within 3 minutes, repeat application and call 911. 1 each 0   ondansetron  (ZOFRAN ) 4 MG tablet Take 1 tablet (4 mg total) by mouth every 8 (eight) hours as needed for nausea or vomiting. 90 tablet 1   pantoprazole  (PROTONIX ) 40 MG tablet Take 1 tablet (40 mg total) by mouth daily. 30 min before food 90 tablet 3   pregabalin  (LYRICA ) 75 MG capsule Take  1 capsule (75 mg total) by mouth 3 (three) times daily. 90 capsule 5   rosuvastatin  (CRESTOR ) 20 MG tablet Take 1 tablet (20 mg total) by mouth daily. PLEASE CALL [phone] TO SCHEDULE 6 MONTH FOLLOW UP VISIT PRIOR TO NEXT REFILL REQUEST. THANK YOU. 90 tablet 0   senna-docusate (SENOKOT-S) 8.6-50 MG tablet Take 1 tablet by mouth at bedtime as needed for mild constipation. 10 tablet 0   tirzepatide  (MOUNJARO ) 15 MG/0.5ML Pen Inject 15 mg into the skin once a week. 6 mL 3   Vibegron  (GEMTESA ) 75 MG TABS Take 1 tablet (75 mg total) by mouth daily. 30 tablet 11   No current facility-administered medications on file prior to visit.     ROS see history of present illness  Objective  Physical Exam Vitals:   07/21/23 1448  BP: 100/68  Pulse: 76  Temp: 98.2 F (36.8 C)  SpO2: 93%    BP Readings from Last 3 Encounters:  07/21/23 100/68  04/07/23 124/65  04/01/23 120/71   Wt Readings from Last 3 Encounters:  07/21/23 228 lb 9.6 oz (103.7 kg)  04/01/23 245 lb (111.1 kg)  02/24/23 247 lb (112 kg)    Physical Exam Constitutional:      General: She is not in acute distress.    Appearance: Normal appearance.  HENT:     Head: Normocephalic.  Cardiovascular:     Rate and Rhythm: Normal rate and regular rhythm.     Heart sounds: Normal heart  sounds.  Pulmonary:     Effort: Pulmonary effort is normal.     Breath sounds: Normal breath sounds.  Skin:    General: Skin is warm and dry.  Neurological:     General: No focal deficit present.     Mental Status: She is alert.  Psychiatric:        Mood and Affect: Mood normal.        Behavior: Behavior normal.      Assessment/Plan: Please see individual problem list.  Type 2 diabetes mellitus with hyperglycemia, without long-term current use of insulin  (HCC) Assessment & Plan: Adequate glycemic control with A1c at 7.5 and rare hypoglycemic episodes. Discussed reducing Jardiance  due to yeast infections and effective glycemic control with Mounjaro . Decrease Jardiance  to 10 mg daily and continue Mounjaro  15 mg weekly. Check A1c today. Encourage healthy diet and regular activity.   Orders: -     Empagliflozin ; Take 1 tablet (10 mg total) by mouth daily before breakfast.  Dispense: 90 tablet; Refill: 3 -     Hemoglobin A1c  Essential hypertension Assessment & Plan: Hypertension well-controlled with losartan . Blood pressure at 100/68 with no symptoms reported. Continue Losartan .   Orders: -     Comprehensive metabolic panel with GFR  Cutaneous candidiasis Assessment & Plan: Recurrent yeast infections likely due to Jardiance  and outdoor activity, with the current infection resolving. Discussed nystatin  powder use. Refill nystatin  powder and decrease Jardiance  to 10 mg daily.  Orders: -     Nystatin ; Apply 1 Application topically 3 (three) times daily as needed.  Dispense: 60 g; Refill: 5  Status post reverse total replacement of left shoulder Assessment & Plan: Surgery 09/01/2022. Chronic shoulder pain with discomfort on movement. Previous physical therapy completed. Discussed home exercises. Follow up with Ortho.    Diabetic peripheral neuropathy associated with type 2 diabetes mellitus (HCC) Assessment & Plan: Peripheral neuropathy with occasional discomfort. Current  Lyrica  dosage is effective. Continue Lyrica  75 mg three time daily.  Hypothyroidism, unspecified type -     TSH      Return in about 6 months (around 01/20/2024) for Follow up.   Dawn Burkitt, NP-C [facility]

## 2023-07-22 ENCOUNTER — Encounter: Payer: Self-pay | Admitting: Nurse Practitioner

## 2023-07-22 LAB — COMPREHENSIVE METABOLIC PANEL WITH GFR
ALT: 25 U/L (ref 0–35)
AST: 30 U/L (ref 0–37)
Albumin: 4 g/dL (ref 3.5–5.2)
Alkaline Phosphatase: 99 U/L (ref 39–117)
BUN: 35 mg/dL — ABNORMAL HIGH (ref 6–23)
CO2: 25 meq/L (ref 19–32)
Calcium: 9.4 mg/dL (ref 8.4–10.5)
Chloride: 103 meq/L (ref 96–112)
Creatinine, Ser: 1.63 mg/dL — ABNORMAL HIGH (ref 0.40–1.20)
GFR: 31.02 mL/min — ABNORMAL LOW (ref >60.00)
Glucose, Bld: 92 mg/dL (ref 70–99)
Potassium: 4.6 meq/L (ref 3.5–5.1)
Sodium: 139 meq/L (ref 135–145)
Total Bilirubin: 0.6 mg/dL (ref 0.2–1.2)
Total Protein: 6.8 g/dL (ref 6.0–8.3)

## 2023-07-22 LAB — HEMOGLOBIN A1C: Hgb A1c MFr Bld: 7.3 % — ABNORMAL HIGH (ref 4.6–6.5)

## 2023-07-22 LAB — TSH: TSH: 1.64 u[IU]/mL (ref 0.35–5.50)

## 2023-07-27 ENCOUNTER — Encounter: Attending: Physician Assistant | Admitting: Physician Assistant

## 2023-07-27 DIAGNOSIS — E11621 Type 2 diabetes mellitus with foot ulcer: Secondary | ICD-10-CM | POA: Diagnosis present

## 2023-07-27 DIAGNOSIS — I480 Paroxysmal atrial fibrillation: Secondary | ICD-10-CM | POA: Insufficient documentation

## 2023-07-27 DIAGNOSIS — L97521 Non-pressure chronic ulcer of other part of left foot limited to breakdown of skin: Secondary | ICD-10-CM | POA: Diagnosis not present

## 2023-07-27 DIAGNOSIS — Z7901 Long term (current) use of anticoagulants: Secondary | ICD-10-CM | POA: Insufficient documentation

## 2023-07-27 DIAGNOSIS — L89623 Pressure ulcer of left heel, stage 3: Secondary | ICD-10-CM | POA: Diagnosis not present

## 2023-07-27 DIAGNOSIS — I87323 Chronic venous hypertension (idiopathic) with inflammation of bilateral lower extremity: Secondary | ICD-10-CM | POA: Insufficient documentation

## 2023-07-29 ENCOUNTER — Encounter: Payer: Self-pay | Admitting: Nurse Practitioner

## 2023-07-29 DIAGNOSIS — E669 Obesity, unspecified: Secondary | ICD-10-CM | POA: Insufficient documentation

## 2023-07-29 NOTE — Assessment & Plan Note (Signed)
 Hypertension well-controlled with losartan . Blood pressure at 100/68 with no symptoms reported. Continue Losartan .

## 2023-07-29 NOTE — Assessment & Plan Note (Signed)
 Peripheral neuropathy with occasional discomfort. Current Lyrica  dosage is effective. Continue Lyrica  75 mg three time daily.

## 2023-07-29 NOTE — Assessment & Plan Note (Signed)
 Recurrent yeast infections likely due to Jardiance  and outdoor activity, with the current infection resolving. Discussed nystatin  powder use. Refill nystatin  powder and decrease Jardiance  to 10 mg daily.

## 2023-07-29 NOTE — Assessment & Plan Note (Addendum)
 Adequate glycemic control with A1c at 7.5 and rare hypoglycemic episodes. Discussed reducing Jardiance  due to yeast infections and effective glycemic control with Mounjaro . Decrease Jardiance  to 10 mg daily and continue Mounjaro  15 mg weekly. Check A1c today. Encourage healthy diet and regular activity.

## 2023-07-29 NOTE — Assessment & Plan Note (Addendum)
 Surgery 09/01/2022. Chronic shoulder pain with discomfort on movement. Previous physical therapy completed. Discussed home exercises. Follow up with Ortho.

## 2023-07-30 ENCOUNTER — Inpatient Hospital Stay: Payer: Medicare HMO

## 2023-07-30 ENCOUNTER — Encounter: Payer: Self-pay | Admitting: Oncology

## 2023-08-03 ENCOUNTER — Inpatient Hospital Stay: Attending: Oncology

## 2023-08-03 DIAGNOSIS — D509 Iron deficiency anemia, unspecified: Secondary | ICD-10-CM | POA: Insufficient documentation

## 2023-08-04 ENCOUNTER — Other Ambulatory Visit: Payer: Self-pay | Admitting: Nurse Practitioner

## 2023-08-04 DIAGNOSIS — E1142 Type 2 diabetes mellitus with diabetic polyneuropathy: Secondary | ICD-10-CM

## 2023-08-05 ENCOUNTER — Ambulatory Visit: Admitting: Podiatry

## 2023-08-05 DIAGNOSIS — E1165 Type 2 diabetes mellitus with hyperglycemia: Secondary | ICD-10-CM

## 2023-08-05 DIAGNOSIS — L97521 Non-pressure chronic ulcer of other part of left foot limited to breakdown of skin: Secondary | ICD-10-CM

## 2023-08-05 MED ORDER — AMMONIUM LACTATE 12 % EX LOTN: 1.0000 | TOPICAL_LOTION | CUTANEOUS | 0 refills | Status: DC | PRN

## 2023-08-05 NOTE — Progress Notes (Signed)
 Subjective:  Patient ID: [name], adult    DOB: 1950/06/19,  MRN: [id number]  Chief Complaint  Patient presents with   Toe Injury    Left foot     73 y.o. adult presents with the above complaint.  Patient presents with left 3rd and 4th digit superficial ulceration limited to the breakdown of the skin.  She states it just came out of nowhere.  She is a type II diabetic.  She denies any other acute complaints would like to discuss further treatment options.  Does not probe down to deep tissue.  She states that she has an appointment at the wound care center as well.  Denies any other acute issues.  No nausea fever chills vomiting no clinical signs of infection   Review of Systems: Negative except as noted in the HPI. Denies N/V/F/Ch.  Past Medical History:  Diagnosis Date   Anemia    Anxiety    a.) on BZO (alprazolam ) PRN   Arthritis    Asthma    Atypical chest pain    a. 03/2018 MV: No ischemia/infarct. EF >65%.   Carotid arterial disease (HCC)    a. 03/2020 U/S: RICA <50, LICA nl.   Chronic heart failure with preserved ejection fraction (HFpEF) (HCC)    a. 08/2013 Echo: EF 60-65%; b. 08/2019 Echo: EF 60-65%; c. 03/2020 Echo: EF 60-65%; d. 05/2021 Echo: EF 55-60%, no rmwa, nl RV fxn, RVSP 54.72mmHg. Mildly dil LA, mild MR, mod TR.   Chronic, continuous use of opioids    a.) has naloxone  Rx available   CKD (chronic kidney disease), stage III (HCC)    Complication of anesthesia    a.) RIGHT hemidiaphragm paralysis s/p interscalene block for shoulder arthroplasty in 2017 --> caused "permanent lung damage" per patient --> REFUSES further neuraxial anesthesia   Cystocele    Deep vein thrombosis (DVT) of right lower extremity (HCC)    Depression    Diaphragm paralysis 2018   a.) RIGHT hemidiaphragm paralysis s/p interscalene block for shoulder arthroplasty in 2017; refuses additional regional/neuraxial anesthesia   DM (diabetes mellitus) type II, controlled, with peripheral vascular  disorder (HCC) 08/31/2014   Dyspnea    11/08/2018 - "more now due to lack of exercise"   Edema    Fatty liver    GERD (gastroesophageal reflux disease)    Gout    Heart murmur    Hemiparesis affecting left side as late effect of cerebrovascular accident (HCC)    Hiatal hernia    a.) s/p repair   History of IBS    History of methicillin resistant staphylococcus aureus (MRSA) 2008   History of multiple strokes    Hyperlipidemia    Hypertension    Hypothyroidism    Insomnia    Long term current use of anticoagulant    a.) apixaban    Mitral regurgitation    a. 05/2021 Echo: Mild MR.   Nephrolithiasis    Neuropathy involving both lower extremities    Non-healing ulcer of left foot (HCC)    Obesity, Class II, BMI 35-39.9, with comorbidity    OSA on CPAP    PAF (paroxysmal atrial fibrillation) (HCC)    a.) 07/2013 Event monitor: Freq PVCs and 3 short runs of Afib; b.) CHA2DS2VASc = 8 (age, sex, CHF, HTN, CVA/DVT x2, vascular disease history, T2DM);  b.) rate/rhythm maintained without pharmacological intervention; chronically anticoagulated with apixaban    PAH (pulmonary artery hypertension) (HCC)    a. 05/2021 Echo: RVSP 54.80mmHg.   Peripheral  vascular disease (HCC)    a. 03/2018 LE Angio: Nl renal arteries. Nl aorta & iliacs. Nl LLE arterial flow; b. 06/2020 ABI's: Nl bilat.   T2DM (type 2 diabetes mellitus) (HCC)    Tricuspid regurgitation    a. 05/2021 Echo: Mod TR (in setting of RVSP 54.84mmHg).   Umbilical hernia    a.) s/p repair   Urinary incontinence    Venous stasis dermatitis of both lower extremities     Current Outpatient Medications:    ammonium lactate  (AMLACTIN DAILY) 12 % lotion, Apply 1 Application topically as needed., Disp: 400 g, Rfl: 0   acetaminophen  (TYLENOL ) 500 MG tablet, Take 2 tablets (1,000 mg total) by mouth every 8 (eight) hours., Disp: 30 tablet, Rfl: 0   albuterol  (VENTOLIN  HFA) 108 (90 Base) MCG/ACT inhaler, Inhale 2 puffs into the lungs every 6 (six)  hours as needed., Disp: 8 g, Rfl: 4   ALPRAZolam  (XANAX ) 0.25 MG tablet, Take 1 tablet (0.25 mg total) by mouth 2 (two) times daily as needed for anxiety. See SIG change was taking 1/2 of 0.5 tablet bid prn changed to 0.25 bid prn. D/c 0.5 xanax , Disp: 60 tablet, Rfl: 2   buPROPion  (WELLBUTRIN  XL) 150 MG 24 hr tablet, Take 1 tablet (150 mg total) by mouth daily., Disp: 90 tablet, Rfl: 3   calcium -vitamin D  (OSCAL WITH D) 500-200 MG-UNIT tablet, Take 1 tablet by mouth 2 (two) times daily with a meal., Disp: 60 tablet, Rfl: 0   cephALEXin  (KEFLEX ) 250 MG capsule, TAKE 1 CAPSULE BY MOUTH ONCE DAILY, Disp: 90 capsule, Rfl: 0   clobetasol  cream (TEMOVATE ) 0.05 %, Apply 1 application topically 2 (two) times daily as needed (skin irritation). , Disp: , Rfl:    colchicine  0.6 MG tablet, Take 1 tablet (0.6 mg total) by mouth daily. Repeat I 1 hour if needed. No more than 3 tablets in 24 hours PRN for GOUT FLARE, Disp: 30 tablet, Rfl: 0   Continuous Glucose Sensor (FREESTYLE LIBRE 3 PLUS SENSOR) MISC, PLACE ONE SENSOR ON THE SKIN EVERY FOURTEEN DAYS.USE TO CHECK GLUCOSE CONTINUOUSLY, Disp: 6 each, Rfl: 3   cyanocobalamin  (VITAMIN B12) 1000 MCG tablet, Take 1,000 mcg by mouth daily., Disp: , Rfl:    diclofenac  Sodium (VOLTAREN ) 1 % GEL, Apply 2 g topically 3 (three) times daily., Disp: 100 g, Rfl: 2   DULoxetine  (CYMBALTA ) 60 MG capsule, Take 1 capsule (60 mg total) by mouth daily., Disp: 90 capsule, Rfl: 3   ELIQUIS  5 MG TABS tablet, TAKE 1 TABLET BY MOUTH TWICE DAILY, Disp: 180 tablet, Rfl: 1   empagliflozin  (JARDIANCE ) 10 MG TABS tablet, Take 1 tablet (10 mg total) by mouth daily before breakfast., Disp: 90 tablet, Rfl: 3   ergocalciferol  (VITAMIN D2) 1.25 MG (50000 UT) capsule, Take 50,000 Units by mouth once a week., Disp: , Rfl:    estradiol  (ESTRACE ) 0.1 MG/GM vaginal cream, Apply 0.5mg  (pea-sized amount)  just inside the vaginal introitus with a finger-tip on Monday, Wednesday and Friday nights,, Disp:  42.5 g, Rfl: 11   ferrous sulfate  324 MG TBEC, Take 324 mg by mouth daily with breakfast., Disp: , Rfl:    fluconazole  (DIFLUCAN ) 150 MG tablet, Take 1 tablet by mouth x 1 dose. May repeat in 72 hours if needed., Disp: 2 tablet, Rfl: 1   Fluticasone -Umeclidin-Vilant (TRELEGY ELLIPTA ) 200-62.5-25 MCG/ACT AEPB, INHALE ONE PUFF INTO THE LUNGS EVERY DAY, Disp: 60 each, Rfl: 5   folic acid  (FOLVITE ) 800 MCG tablet, Take 800 mcg  by mouth daily., Disp: , Rfl:    furosemide  (LASIX ) 40 MG tablet, Take 1 tablet (40 mg total) by mouth as directed. Take 1 tablet daily. May take additional 1 tablet If weight gain of greater than 2 pounds in 24 hours or 5 pounds in one week., Disp: 90 tablet, Rfl: 3   glucose blood (ONETOUCH ULTRA) test strip, TEST TWICE DAILY AS DIRECTED, Disp: 100 each, Rfl: 11   ipratropium-albuterol  (DUONEB) 0.5-2.5 (3) MG/3ML SOLN, Take 3 mLs by nebulization every 6 (six) hours as needed., Disp: , Rfl:    Lancets (ONETOUCH ULTRASOFT) lancets, Use as instructed, Disp: 100 each, Rfl: 12   levocetirizine (XYZAL ) 5 MG tablet, TAKE ONE TABLET BY MOUTH AT BEDTIME AS NEEDED FOR ALLERGIES, Disp: 90 tablet, Rfl: 3   levothyroxine  (SYNTHROID ) 137 MCG tablet, Take 1 tablet (137 mcg total) by mouth daily before breakfast., Disp: 90 tablet, Rfl: 3   Loperamide -Simethicone  2-125 MG TABS, Take 1 tablet by mouth daily as needed. May repeat in 1 hour, Disp: , Rfl:    losartan  (COZAAR ) 25 MG tablet, Take 1 tablet (25 mg total) by mouth daily., Disp: 90 tablet, Rfl: 3   Magnesium  ([employer]S TRIPLE MAGNESIUM  COMPLEX) 400 MG CAPS, Take 1 capsule by mouth daily., Disp: , Rfl:    Misc. Devices (TUB TRANSFER BOARD) MISC, 1 Device by Does not apply route as needed., Disp: 1 each, Rfl: 0   montelukast  (SINGULAIR ) 10 MG tablet, TAKE 1 TABLET BY MOUTH NIGHTLY, Disp: 90 tablet, Rfl: 3   Multiple Vitamins-Minerals (HAIR SKIN & NAILS) TABS, Take 1 tablet by mouth daily., Disp: , Rfl:    naloxone  (NARCAN ) nasal spray 4 mg/0.1  mL, Place 1 spray into the nose as needed for up to 365 doses (for opioid-induced respiratory depresssion). In case of emergency (overdose), spray once into each nostril. If no response within 3 minutes, repeat application and call 911., Disp: 1 each, Rfl: 0   nystatin  powder, Apply 1 Application topically 3 (three) times daily as needed., Disp: 60 g, Rfl: 5   ondansetron  (ZOFRAN ) 4 MG tablet, Take 1 tablet (4 mg total) by mouth every 8 (eight) hours as needed for nausea or vomiting., Disp: 90 tablet, Rfl: 1   pantoprazole  (PROTONIX ) 40 MG tablet, Take 1 tablet (40 mg total) by mouth daily. 30 min before food, Disp: 90 tablet, Rfl: 3   pregabalin  (LYRICA ) 75 MG capsule, TAKE ONE CAPSULE BY MOUTH THREE TIMES DAILY, Disp: 90 capsule, Rfl: 5   rosuvastatin  (CRESTOR ) 20 MG tablet, Take 1 tablet (20 mg total) by mouth daily. PLEASE CALL [phone] TO SCHEDULE 6 MONTH FOLLOW UP VISIT PRIOR TO NEXT REFILL REQUEST. THANK YOU., Disp: 90 tablet, Rfl: 0   senna-docusate (SENOKOT-S) 8.6-50 MG tablet, Take 1 tablet by mouth at bedtime as needed for mild constipation., Disp: 10 tablet, Rfl: 0   tirzepatide  (MOUNJARO ) 15 MG/0.5ML Pen, Inject 15 mg into the skin once a week., Disp: 6 mL, Rfl: 3   Vibegron  (GEMTESA ) 75 MG TABS, Take 1 tablet (75 mg total) by mouth daily., Disp: 30 tablet, Rfl: 11  Social History   Tobacco Use  Smoking Status Never  Smokeless Tobacco Never    Allergies  Allergen Reactions   Morphine  And Codeine Other (See Comments) and Nausea Only    Patient becomes very confused Other reaction(s): Other (See Comments), Other (See Comments) Patient becomes very confused Patient becomes very confused    Penicillins Hives, Other (See Comments), Shortness Of Breath and Swelling  Tolerates Keflex  and Ceftriaxone   Other Reaction(s): Not available   Ambien  [Zolpidem ]     Hallucination    Aspirin  Hives   Oxytetracycline Hives    Other reaction(s): Unknown    Morphine  Nausea Only    Objective:  There were no vitals filed for this visit. There is no height or weight on file to calculate BMI. Constitutional Well developed. Well nourished.  Vascular Dorsalis pedis pulses palpable bilaterally. Posterior tibial pulses palpable bilaterally. Capillary refill normal to all digits.  No cyanosis or clubbing noted. Pedal hair growth normal.  Neurologic Normal speech. Oriented to person, place, and time. Epicritic sensation to light touch grossly present bilaterally.  Dermatologic Left hallux 3rd and 4th digit skin ulceration noted limited to the breakdown of the skin.  No purulent drainage noted.  No cellulitis erythema noted.  Does not probe down to deep tissue.  Orthopedic: Normal joint ROM without pain or crepitus bilaterally. No visible deformities. No bony tenderness.   Radiographs: None Assessment:   1. Toe ulcer, left, limited to breakdown of skin (HCC)   2. Type 2 diabetes mellitus with hyperglycemia, without long-term current use of insulin  (HCC)    Plan:  Patient was evaluated and treated and all questions answered.  Left hallux 3rd and 4th digit skin ulceration limited to the breakdown of the skin - All questions and concerns were discussed with the patient extensively given the presence of skin breakdown patient will benefit from Betadine wet-to-dry dressing changes daily. - She is scheduled to see Dr. Diannah Forts at the wound care center on June 3.  Will defer further management to that. - If the wound progress patient has a high risk for undergoing digital amputation patient states understanding  No follow-ups on file.  Left hallux 3rd and 4th skin breakdown Betadine wet-to-dry patient is scheduled to see the wound care center

## 2023-08-10 ENCOUNTER — Encounter: Admitting: Physician Assistant

## 2023-08-10 ENCOUNTER — Ambulatory Visit: Payer: Self-pay | Admitting: Oncology

## 2023-08-10 ENCOUNTER — Other Ambulatory Visit: Payer: Self-pay

## 2023-08-10 ENCOUNTER — Inpatient Hospital Stay

## 2023-08-10 DIAGNOSIS — D509 Iron deficiency anemia, unspecified: Secondary | ICD-10-CM | POA: Diagnosis not present

## 2023-08-10 DIAGNOSIS — E11621 Type 2 diabetes mellitus with foot ulcer: Secondary | ICD-10-CM | POA: Diagnosis not present

## 2023-08-10 LAB — CBC (CANCER CENTER ONLY)
HCT: 44.2 % (ref 36.0–46.0)
Hemoglobin: 14 g/dL (ref 12.0–15.0)
MCH: 29.2 pg (ref 26.0–34.0)
MCHC: 31.7 g/dL (ref 30.0–36.0)
MCV: 92.1 fL (ref 80.0–100.0)
Platelet Count: 183 10*3/uL (ref 150–400)
RBC: 4.8 MIL/uL (ref 3.87–5.11)
RDW: 14.6 % (ref 11.5–15.5)
WBC Count: 5.7 10*3/uL (ref 4.0–10.5)
nRBC: 0 % (ref 0.0–0.2)

## 2023-08-10 LAB — IRON AND TIBC
Iron: 68 ug/dL (ref 28–170)
Saturation Ratios: 20 % (ref 10.4–31.8)
TIBC: 340 ug/dL (ref 250–450)
UIBC: 272 ug/dL

## 2023-08-10 LAB — FERRITIN: Ferritin: 182 ng/mL (ref 11–307)

## 2023-08-11 ENCOUNTER — Other Ambulatory Visit: Payer: Self-pay | Admitting: Nurse Practitioner

## 2023-08-11 DIAGNOSIS — E039 Hypothyroidism, unspecified: Secondary | ICD-10-CM

## 2023-08-21 ENCOUNTER — Other Ambulatory Visit: Payer: Self-pay | Admitting: Nurse Practitioner

## 2023-08-21 DIAGNOSIS — N952 Postmenopausal atrophic vaginitis: Secondary | ICD-10-CM

## 2023-08-24 ENCOUNTER — Encounter: Attending: Physician Assistant | Admitting: Physician Assistant

## 2023-08-24 DIAGNOSIS — L89623 Pressure ulcer of left heel, stage 3: Secondary | ICD-10-CM | POA: Diagnosis not present

## 2023-08-24 DIAGNOSIS — L97521 Non-pressure chronic ulcer of other part of left foot limited to breakdown of skin: Secondary | ICD-10-CM | POA: Insufficient documentation

## 2023-08-24 DIAGNOSIS — Z7901 Long term (current) use of anticoagulants: Secondary | ICD-10-CM | POA: Diagnosis not present

## 2023-08-24 DIAGNOSIS — I87323 Chronic venous hypertension (idiopathic) with inflammation of bilateral lower extremity: Secondary | ICD-10-CM | POA: Insufficient documentation

## 2023-08-24 DIAGNOSIS — I480 Paroxysmal atrial fibrillation: Secondary | ICD-10-CM | POA: Insufficient documentation

## 2023-08-24 DIAGNOSIS — E11621 Type 2 diabetes mellitus with foot ulcer: Secondary | ICD-10-CM | POA: Diagnosis not present

## 2023-08-24 IMAGING — CT CT ABD-PELV W/O CM
2 of 4 series · 16 of 46 positions shown, 18 images · non-contrast
Comparison: 02/07/2020, 08/12/2017

CLINICAL DATA: Abdominal pain, acute, nonlocalized



[Series 2: routine abd/pel wo · axial · 0.98mm/px · z∈[-634,-129]mm · 13 of 111 slices shown, 15 images]
[im 5/111  soft-tissue]
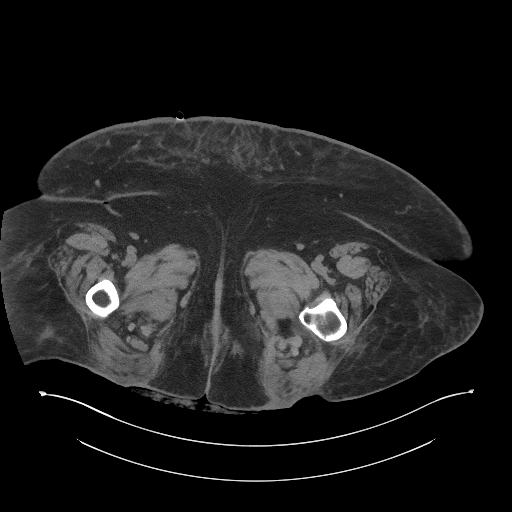
[im 5/111  bone]
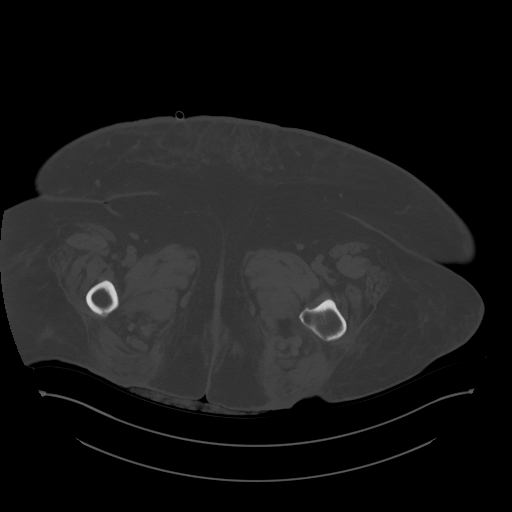
[im 14/111  soft-tissue]
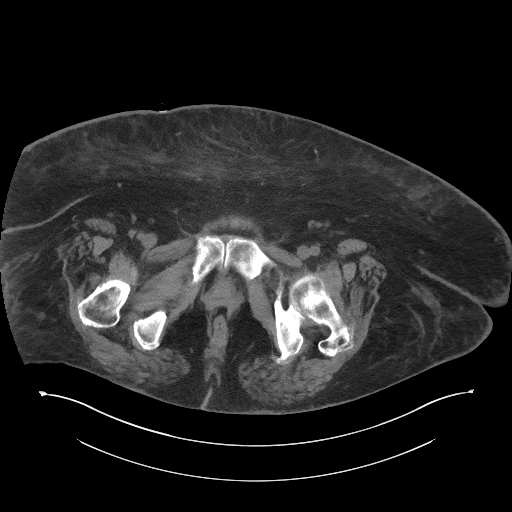
[im 23/111  soft-tissue]
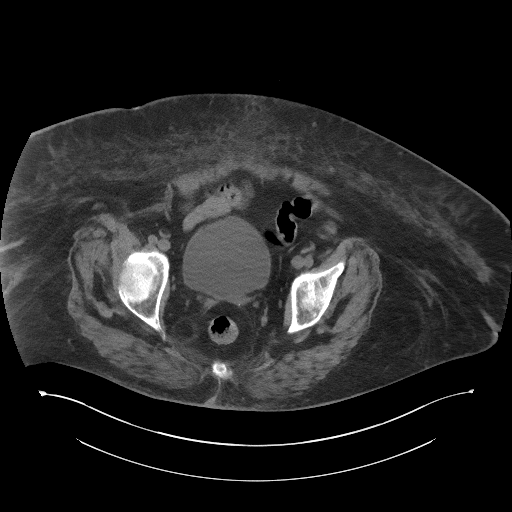
[im 33/111  soft-tissue]
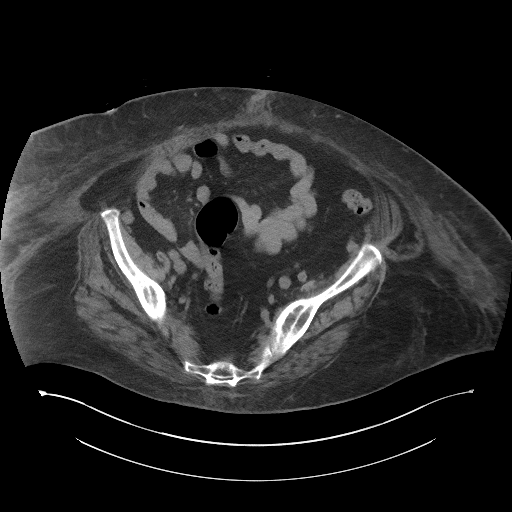
[im 37/111  soft-tissue]
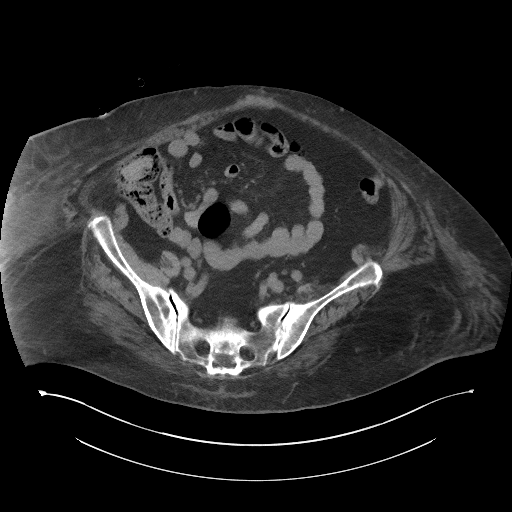
[im 46/111  soft-tissue]
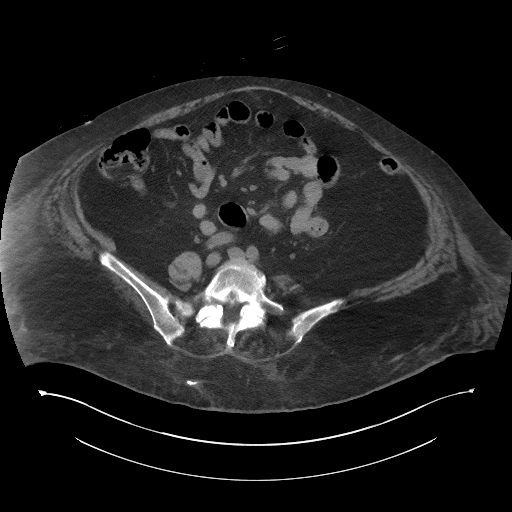
[im 56/111  soft-tissue]
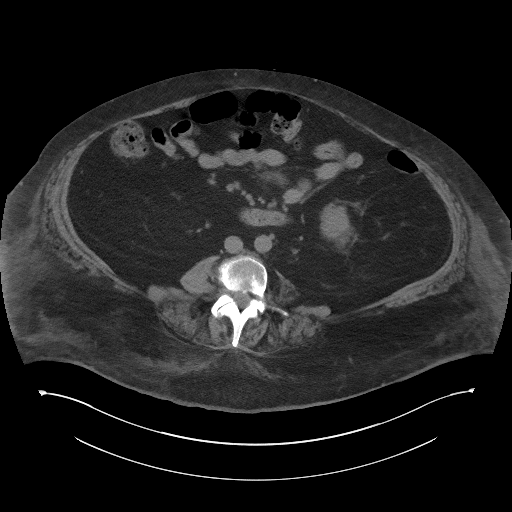
[im 65/111  soft-tissue]
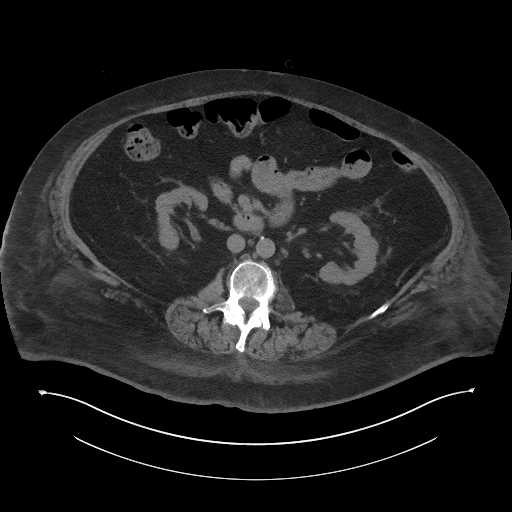
[im 74/111  soft-tissue]
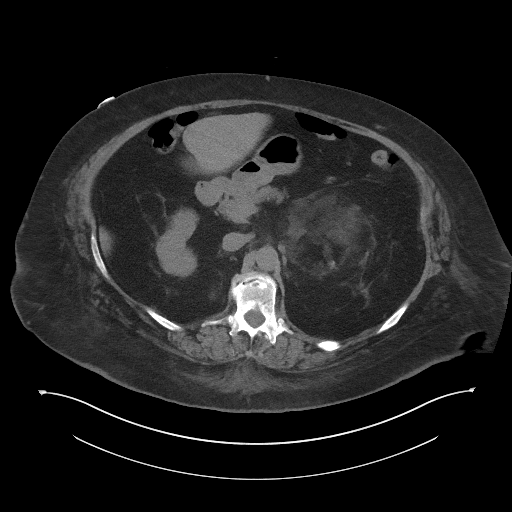
[im 74/111  bone]
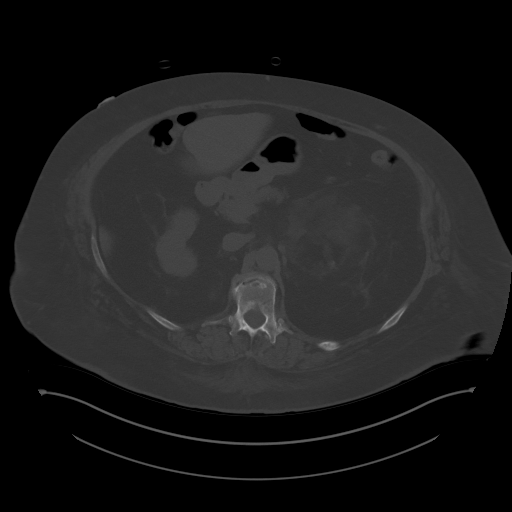
[im 78/111  soft-tissue]
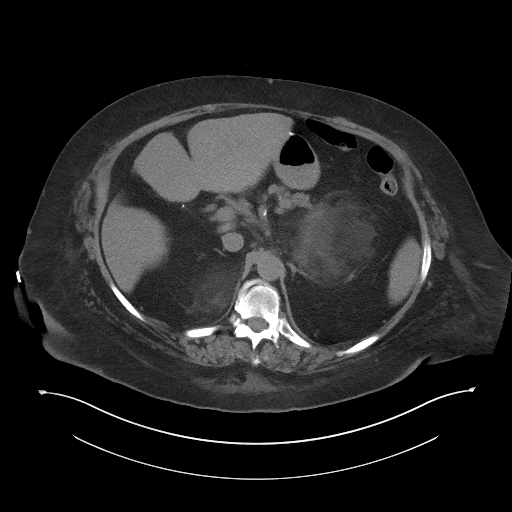
[im 88/111  soft-tissue]
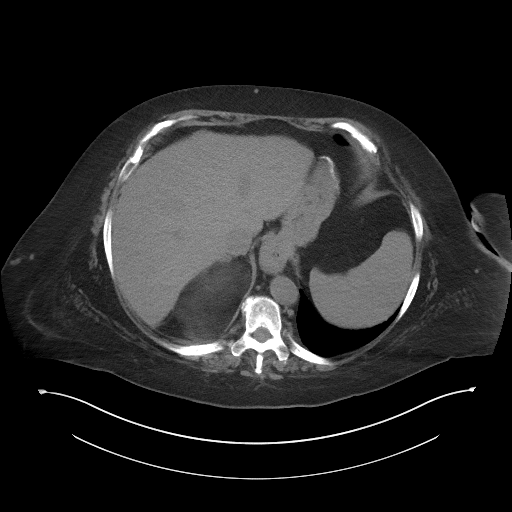
[im 97/111  soft-tissue]
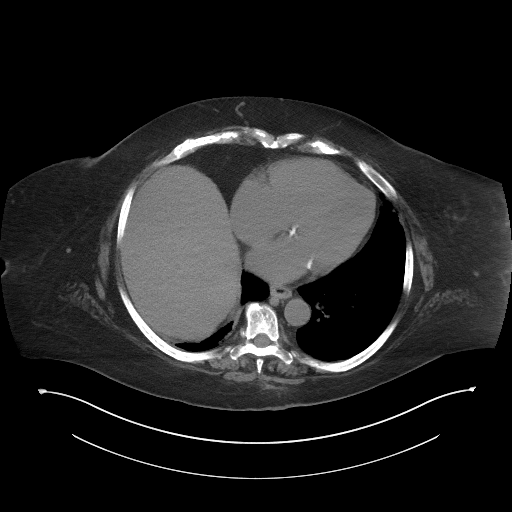
[im 106/111  soft-tissue]
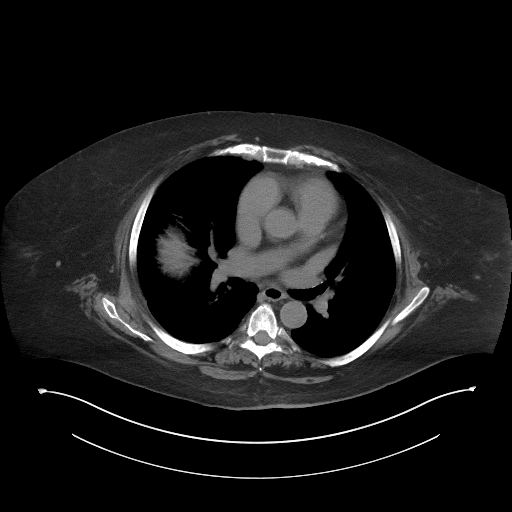

[Series 5: coronal st · coronal · 1.05mm/px · 3 of 114 slices shown]
[im 38/114  soft-tissue]
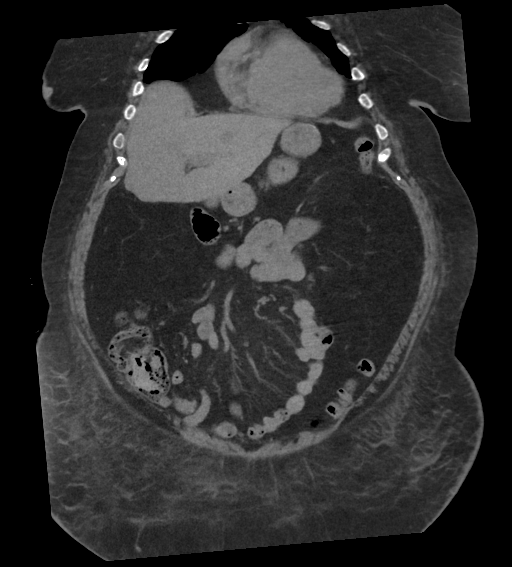
[im 51/114  soft-tissue]
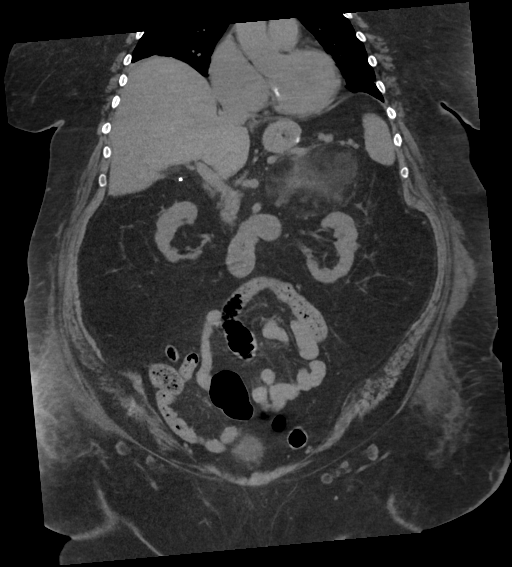
[im 63/114  soft-tissue]
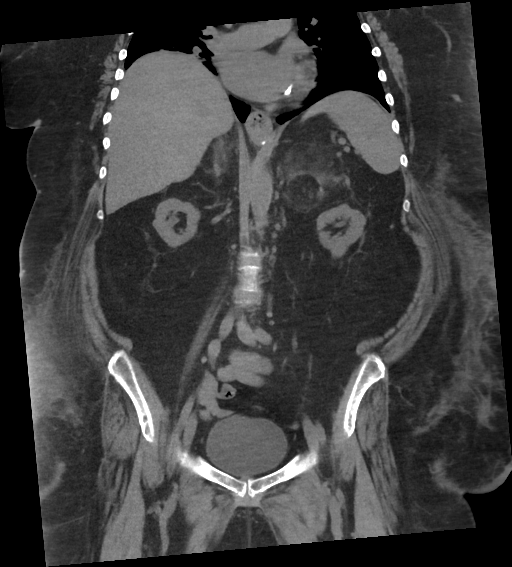

[16 of 46 positions shown; findings below may reference images not displayed]

FINDINGS: Lower chest: Mild right basilar atelectasis. Cardiac size within
normal limits. Central pulmonary arteries are enlarged in keeping
with changes of pulmonary arterial hypertension. Small hiatal
hernia.

Hepatobiliary: No focal liver abnormality is seen. Status post
cholecystectomy. No biliary dilatation.

Pancreas: Unremarkable

Spleen: Unremarkable

Adrenals/Urinary Tract: Bilateral macroscopic fat containing adrenal
masses are again identified in keeping with bilateral adrenal
myelolipoma. Since remote prior examination, these demonstrate
interval increase in size, now measuring 9.4 cm on the left and
cm in greatest dimension on the right. The kidneys are normal in
size and position. No hydronephrosis. Nonobstructing 6 mm calculus
within the left renal pelvis. No additional renal or ureteral
calculi. Bladder unremarkable.

Stomach/Bowel: Surgical changes of gastric sleeve resection are
identified. The stomach, small bowel, and large bowel are otherwise
unremarkable. Appendix normal. No free intraperitoneal gas or fluid.

Vascular/Lymphatic: No significant vascular findings are present. No
enlarged abdominal or pelvic lymph nodes.

Reproductive: Status post hysterectomy. No adnexal masses.

Other: Moderate subcutaneous infiltration is seen within the pannus
and flanks bilaterally, most commonly reflecting subcutaneous edema.
No abdominal wall hernia.

Musculoskeletal: No acute bone abnormality. Degenerative changes are
seen within the thoracolumbar spine. Stable T12 mild compression
deformity.
IMPRESSION: No acute intra-abdominal pathology identified. No definite
radiographic explanation for the patient's reported symptoms.

Bilateral benign adrenal myelolipomas again identified demonstrating
mild interval increase in size since prior examination.

Status post gastric sleeve resection.  Small hiatal hernia.

## 2023-08-24 IMAGING — CR DG CHEST 2V
1 series · 2 of 2 positions shown · non-contrast
Comparison: 04/11/2021

CLINICAL DATA: Shortness of breath.

EXAM:
CHEST - 2 VIEW

[Series 1: dg chest 2 view · 0.14mm/px · 2 of 2 slices shown]
[im 1/2]
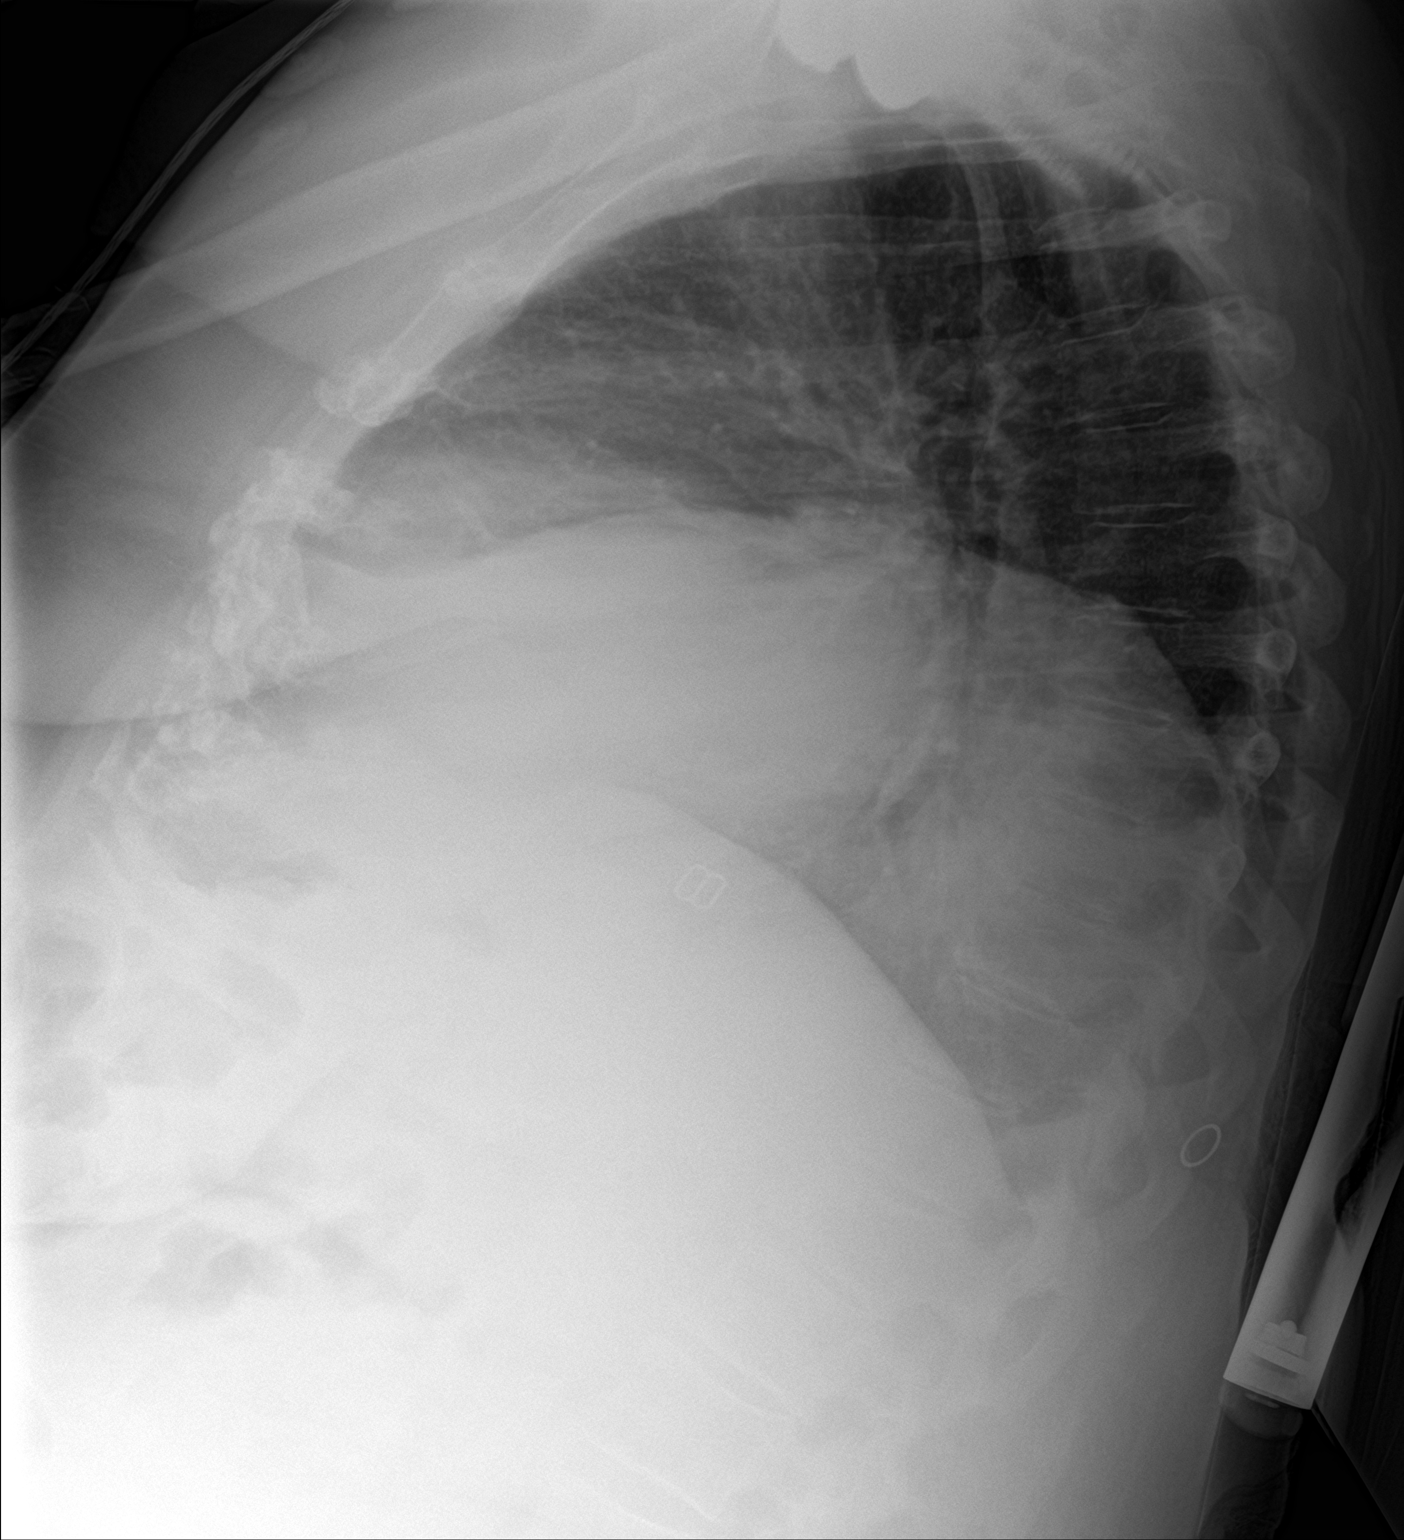
[im 2/2]
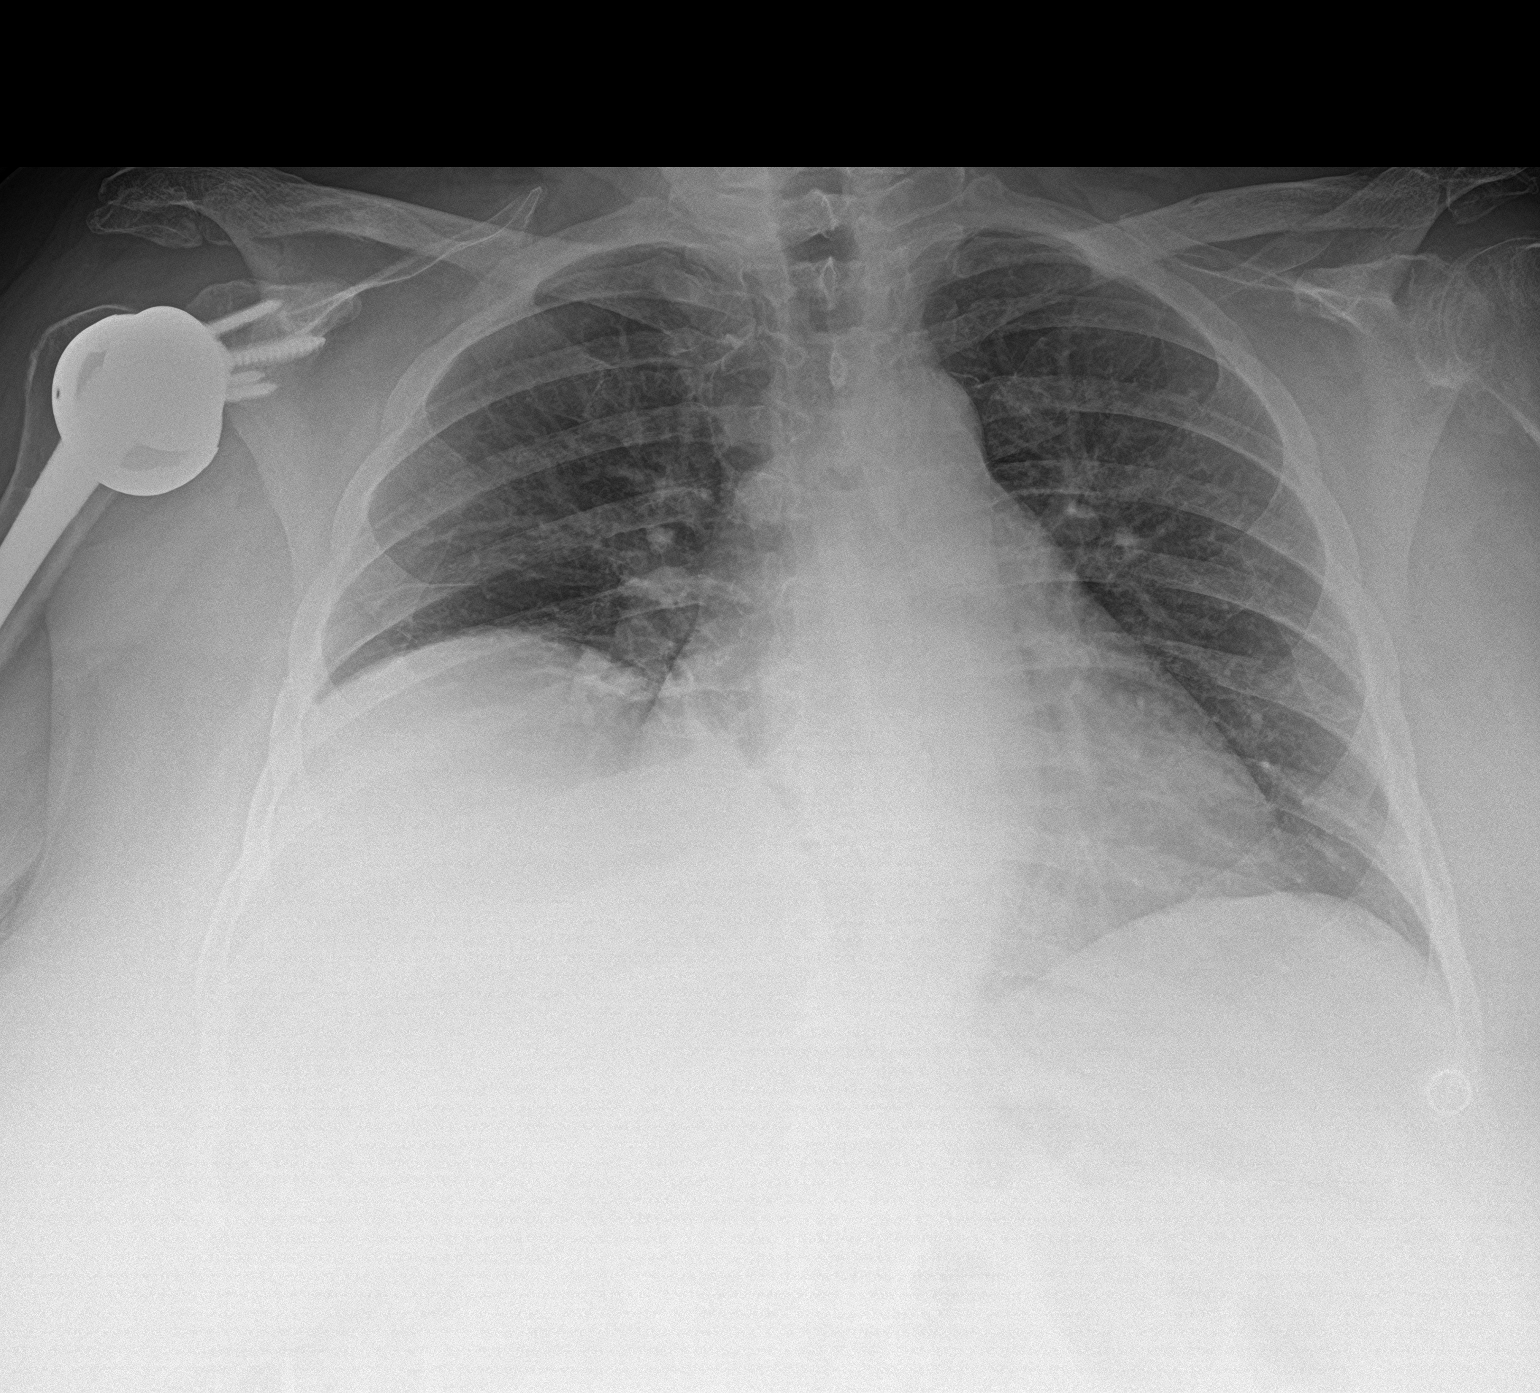

[2 of 2 positions shown; findings below may reference images not displayed]

FINDINGS: Stable enlargement of the cardiopericardial silhouette. Asymmetric
elevation of the right hemidiaphragm is unchanged. No pulmonary
edema or pleural effusion. Insert no consolidation status post right
shoulder replacement
IMPRESSION: Stable exam. No acute cardiopulmonary findings.

## 2023-08-26 ENCOUNTER — Other Ambulatory Visit: Payer: Self-pay | Admitting: Nurse Practitioner

## 2023-08-26 ENCOUNTER — Other Ambulatory Visit: Payer: Self-pay | Admitting: Cardiovascular Disease

## 2023-08-26 DIAGNOSIS — I5033 Acute on chronic diastolic (congestive) heart failure: Secondary | ICD-10-CM

## 2023-08-27 ENCOUNTER — Telehealth: Payer: Self-pay | Admitting: Cardiovascular Disease

## 2023-08-27 MED ORDER — FUROSEMIDE 40 MG PO TABS: 40.0000 mg | ORAL_TABLET | ORAL | 1 refills | Status: DC

## 2023-08-27 NOTE — Telephone Encounter (Signed)
*  STAT* If patient is at the pharmacy, call can be transferred to refill team.   1. Which medications need to be refilled? (please list name of each medication and dose if known)  Furosemide  20 MG Furosemide  40 MG  2. Which pharmacy/location (including street and city if local pharmacy) is medication to be sent to? [facility] - [name], [place] - [address]    3. Do they need a 30 day or 90 day supply? 90 day supply

## 2023-08-27 NOTE — Telephone Encounter (Signed)
 Left a message for the patient to call back for clarification on how she is taking the furosemide . The patient is due for a follow up as well.

## 2023-08-27 NOTE — Telephone Encounter (Signed)
 RX sent to requested Pharmacy

## 2023-08-31 ENCOUNTER — Other Ambulatory Visit: Payer: Self-pay | Admitting: Nurse Practitioner

## 2023-08-31 DIAGNOSIS — J4540 Moderate persistent asthma, uncomplicated: Secondary | ICD-10-CM

## 2023-09-07 DIAGNOSIS — Z6841 Body Mass Index (BMI) 40.0 and over, adult: Secondary | ICD-10-CM | POA: Diagnosis not present

## 2023-09-07 DIAGNOSIS — K219 Gastro-esophageal reflux disease without esophagitis: Secondary | ICD-10-CM | POA: Diagnosis not present

## 2023-09-09 DIAGNOSIS — Z1331 Encounter for screening for depression: Secondary | ICD-10-CM | POA: Diagnosis not present

## 2023-09-09 DIAGNOSIS — G8194 Hemiplegia, unspecified affecting left nondominant side: Secondary | ICD-10-CM | POA: Diagnosis not present

## 2023-09-09 DIAGNOSIS — E1151 Type 2 diabetes mellitus with diabetic peripheral angiopathy without gangrene: Secondary | ICD-10-CM | POA: Diagnosis not present

## 2023-09-22 ENCOUNTER — Other Ambulatory Visit: Payer: Self-pay

## 2023-09-22 DIAGNOSIS — E1165 Type 2 diabetes mellitus with hyperglycemia: Secondary | ICD-10-CM

## 2023-09-28 ENCOUNTER — Ambulatory Visit: Admitting: Physician Assistant

## 2023-09-29 ENCOUNTER — Ambulatory Visit: Payer: Self-pay

## 2023-09-29 NOTE — Telephone Encounter (Signed)
 Patient unavailable at this time and will call back for triage.   Copied from CRM [id number]. Topic: Clinical - Medication Question >> Sep 29, 2023 10:38 AM Drema MATSU wrote: Reason for CRM: Patient is requesting fluconazole  (DIFLUCAN ) 150 MG tablet and nystatin  powder (Requesting 90 days supply). She is covered in yeast in her groin area. She stated that she worked in the yard last week.    FYI Only or Action Required?: Action required by provider: Requesting medication for yeast infection.  Patient was last seen in primary care on 07/21/2023 by Gretel App, NP.  Called Nurse Triage reporting Vaginitis.  Symptoms began a week ago.  Interventions attempted: OTC medications: Nystatin .  Symptoms are: gradually worsening.  Triage Disposition: See Physician Within 24 Hours  Patient/caregiver understands and will follow disposition?: No, wishes to speak with PCP     Patient declined going to urgent care and would like a prescription for Diflucan  and Nystatin  powder for her symptoms if possible. She would like a response to her request when able.     1. SYMPTOM: What's the main symptom you're concerned about? (e.g., pain, itching, dryness) Vaginal irritation and itching  2. LOCATION: Where is the itching located? (e.g., inside/outside, left/right) Outside 3. ONSET: When did the irritation start? 1 week 4. PAIN: Is there any pain? If Yes, ask: How bad is it? (Scale: 1-10; mild, moderate, severe) - MILD (1-3): Doesn't interfere with normal activities.  - MODERATE (4-7): Interferes with normal activities (e.g., work or school) or awakens from sleep.  - SEVERE (8-10): Excruciating pain, unable to do any normal activities. Moderate  5. ITCHING: Is there any itching? If Yes, ask: How bad is it? (Scale: 1-10; mild, moderate, severe) Yes, moderate  6. CAUSE: What do you think is causing the discharge? Have you had the same problem before? What happened then? Believes  she has a yeast infection  7. OTHER SYMPTOMS: Do you have any other symptoms? (e.g., fever, itching, vaginal bleeding, pain with urination, injury to genital area, vaginal foreign body) No   Reason for Disposition  MODERATE-SEVERE itching (i.e., interferes with school, work, or sleep)  Protocols used: Vaginal Symptoms-A-AH

## 2023-09-29 NOTE — Telephone Encounter (Signed)
 Patient unavailable at this time and will call back for triage.   Copied from CRM [id number]. Topic: Clinical - Medication Question >> Sep 29, 2023 10:38 AM Drema MATSU wrote: Reason for CRM: Patient is requesting fluconazole  (DIFLUCAN ) 150 MG tablet and nystatin  powder (Requesting 90 days supply). She is covered in yeast in her groin area. She stated that she worked in the yard last week.

## 2023-09-30 ENCOUNTER — Other Ambulatory Visit: Payer: Self-pay | Admitting: Nurse Practitioner

## 2023-09-30 DIAGNOSIS — B372 Candidiasis of skin and nail: Secondary | ICD-10-CM

## 2023-09-30 DIAGNOSIS — B3731 Acute candidiasis of vulva and vagina: Secondary | ICD-10-CM

## 2023-09-30 MED ORDER — NYSTATIN 100000 UNIT/GM EX POWD: 1.0000 | Freq: Three times a day (TID) | CUTANEOUS | 5 refills | Status: AC | PRN

## 2023-09-30 MED ORDER — FLUCONAZOLE 150 MG PO TABS: ORAL_TABLET | ORAL | 1 refills | Status: DC

## 2023-09-30 NOTE — Telephone Encounter (Signed)
 Copied from CRM [id number]. Topic: Clinical - Medication Refill >> Sep 30, 2023 10:07 AM Carlyon D wrote: Medication: Diflucan  and 90 day Nystantin   Has the patient contacted their pharmacy? Yes (Agent: If no, request that the patient contact the pharmacy for the refill. If patient does not wish to contact the pharmacy document the reason why and proceed with request.) (Agent: If yes, when and what did the pharmacy advise?)  This is the patient's preferred pharmacy:  [facility] - [place], [place] - [address] [place] [address] Phone: [phone] Fax: [phone]  [facility] [address] [facility] - [place], [place] - [address] [facility] [address] [facility] [address] [facility] [address] [place] [address] Phone: [phone] Fax: [phone]  Is this the correct pharmacy for this prescription? Yes If no, delete pharmacy and type the correct one.   Has the prescription been filled recently? Yes  Is the patient out of the medication? Yes  Has the patient been seen for an appointment in the last year OR does the patient have an upcoming appointment? Yes  Can we respond through MyChart? No  Agent: Please be advised that Rx refills may take up to 3 business days. We ask that you follow-up with your pharmacy.

## 2023-10-04 ENCOUNTER — Other Ambulatory Visit: Payer: Self-pay

## 2023-10-04 DIAGNOSIS — J309 Allergic rhinitis, unspecified: Secondary | ICD-10-CM

## 2023-10-04 MED ORDER — LEVOCETIRIZINE DIHYDROCHLORIDE 5 MG PO TABS: ORAL_TABLET | ORAL | 3 refills | Status: DC

## 2023-10-05 ENCOUNTER — Ambulatory Visit: Admitting: Physician Assistant

## 2023-10-14 ENCOUNTER — Ambulatory Visit: Admitting: Physician Assistant

## 2023-11-02 ENCOUNTER — Ambulatory Visit: Admitting: Medical

## 2023-11-02 NOTE — Progress Notes (Deleted)
  Cardiology Office Note   Date:  11/02/2023  ID:  YEE GANGI, DOB Mar 05, 1951, MRN [id number] PCP: Gretel App, NP  [facility] Providers Cardiologist:  [name] Cage, MD { Click to update primary MD,subspecialty MD or APP then REFRESH:1}    History of Present Illness [name] is a 73 y.o. adult with a h/o permanent atrial fibrillation, chronic diastolic heart failure, diabetes, hypertension, hyperlipidemia, obesity, sleep apnea on CPAP, CKD stage III, previous DVT who presents for follow-up.  Patient has chronic left foot ulceration.  She had no evidence of peripheral arterial disease on noninvasive Doppler and angiogram done in 2020.  Nuclear stress test in January 2020 showed no evidence of ischemia.  She had small recurrent strokes on anticoagulation in 2021 and in 2022.  She was switched from Pradaxa  to Eliquis .  She was hospitalized in March 2023 with lower extremity cellulitis and acute on chronic diastolic heart failure.  She underwent left shoulder surgery in June 2024 and was in rehab for 10 days.  Patient was last seen November 2020 for and was stable from a cardiac perspective.  ROS: ***  Studies Reviewed      *** Risk Assessment/Calculations {Does this patient have ATRIAL FIBRILLATION?:[phone]} No BP recorded.  {Refresh Note OR Click here to enter BP  :1}***       Physical Exam VS:  There were no vitals taken for this visit.       Wt Readings from Last 3 Encounters:  07/21/23 228 lb 9.6 oz (103.7 kg)  04/01/23 245 lb (111.1 kg)  02/24/23 247 lb (112 kg)    GEN: Well nourished, well developed in no acute distress NECK: No JVD; No carotid bruits CARDIAC: ***RRR, no murmurs, rubs, gallops RESPIRATORY:  Clear to auscultation without rales, wheezing or rhonchi  ABDOMEN: Soft, non-tender, non-distended EXTREMITIES:  No edema; No deformity   ASSESSMENT AND PLAN ***    {Are you ordering a CV Procedure (e.g. stress test, cath, DCCV, TEE,  etc)?   Press F2        :[id number]}  Dispo: ***  Signed, Krishiv Sandler VEAR Fishman, PA-C

## 2023-11-09 ENCOUNTER — Encounter: Attending: Physician Assistant | Admitting: Physician Assistant

## 2023-11-09 DIAGNOSIS — I87323 Chronic venous hypertension (idiopathic) with inflammation of bilateral lower extremity: Secondary | ICD-10-CM | POA: Insufficient documentation

## 2023-11-09 DIAGNOSIS — L89623 Pressure ulcer of left heel, stage 3: Secondary | ICD-10-CM | POA: Diagnosis not present

## 2023-11-09 DIAGNOSIS — L89899 Pressure ulcer of other site, unspecified stage: Secondary | ICD-10-CM | POA: Diagnosis not present

## 2023-11-09 DIAGNOSIS — Z7901 Long term (current) use of anticoagulants: Secondary | ICD-10-CM | POA: Diagnosis not present

## 2023-11-09 DIAGNOSIS — L97521 Non-pressure chronic ulcer of other part of left foot limited to breakdown of skin: Secondary | ICD-10-CM | POA: Diagnosis not present

## 2023-11-09 DIAGNOSIS — I480 Paroxysmal atrial fibrillation: Secondary | ICD-10-CM | POA: Insufficient documentation

## 2023-11-09 DIAGNOSIS — E11621 Type 2 diabetes mellitus with foot ulcer: Secondary | ICD-10-CM | POA: Insufficient documentation

## 2023-11-29 ENCOUNTER — Telehealth: Payer: Self-pay | Admitting: Oncology

## 2023-11-29 ENCOUNTER — Other Ambulatory Visit: Payer: Self-pay

## 2023-11-29 ENCOUNTER — Ambulatory Visit: Payer: Self-pay

## 2023-11-29 DIAGNOSIS — D509 Iron deficiency anemia, unspecified: Secondary | ICD-10-CM

## 2023-11-29 NOTE — Telephone Encounter (Signed)
 Noted.  Dawn Mascot, MD

## 2023-11-29 NOTE — Telephone Encounter (Signed)
 FYI Only or Action Required?: FYI only for provider.  Patient was last seen in primary care on 07/21/2023 by Dawn App, [name].  Called Nurse Triage reporting hand swelling.  Symptoms began several days ago.  Interventions attempted: Nothing.  Symptoms are: unchanged.  Triage Disposition: See PCP Within 2 Weeks  Patient/caregiver understands and will follow disposition?: Yes    Copied from CRM [id number]. Topic: Clinical - Red Word Triage >> Nov 29, 2023  8:35 AM Macario HERO wrote: Red Word that prompted transfer to Nurse Triage: Patient called said her right hand is swollen. Reason for Disposition  [1] MILD pain (e.g., does not interfere with normal activities) AND [2] present > 7 days  Answer Assessment - Initial Assessment Questions 1. ONSET: When did the pain start?     Saturday am, states fell last week on Tues. 2. LOCATION: Where is the pain located?     Right hand 3. PAIN: How bad is the pain? (Scale 1-10; or mild, moderate, severe)     States can't make a fist with the right hand, states just sore 4. WORK OR EXERCISE: Has there been any recent work or exercise that involved this part (i.e., hand or wrist) of the body?     Did have a fall last week 5. CAUSE: What do you think is causing the pain?     unknown 6. AGGRAVATING FACTORS: What makes the pain worse? (e.g., using computer)     Just swollen and sore 7. OTHER SYMPTOMS: Do you have any other symptoms? (e.g., fever, neck pain, numbness or tingling, rash, swelling)     Swelling, numbness and tingling, hx DM 8. PREGNANCY: Is there any chance you are pregnant? When was your last menstrual period?     na  Protocols used: Hand Pain-A-AH

## 2023-11-29 NOTE — Telephone Encounter (Signed)
 Pt called and wants to move her lab appt time to the morning as she has another appt this morning as well. Pt stated she was okay with the wait time from new lab appt to MD appt, she just didn't want to go home and then come back in between appts

## 2023-11-30 ENCOUNTER — Inpatient Hospital Stay: Attending: Oncology

## 2023-11-30 ENCOUNTER — Other Ambulatory Visit: Payer: Medicare HMO

## 2023-11-30 ENCOUNTER — Encounter: Payer: Self-pay | Admitting: Family

## 2023-11-30 ENCOUNTER — Ambulatory Visit

## 2023-11-30 ENCOUNTER — Inpatient Hospital Stay: Payer: Medicare HMO | Admitting: Oncology

## 2023-11-30 ENCOUNTER — Inpatient Hospital Stay: Payer: Medicare HMO

## 2023-11-30 ENCOUNTER — Ambulatory Visit: Admitting: Family

## 2023-11-30 VITALS — BP 120/78 | HR 88 | Temp 98.0°F | Ht 64.0 in | Wt 218.0 lb

## 2023-11-30 DIAGNOSIS — F419 Anxiety disorder, unspecified: Secondary | ICD-10-CM

## 2023-11-30 DIAGNOSIS — M25441 Effusion, right hand: Secondary | ICD-10-CM | POA: Diagnosis not present

## 2023-11-30 DIAGNOSIS — D509 Iron deficiency anemia, unspecified: Secondary | ICD-10-CM | POA: Insufficient documentation

## 2023-11-30 DIAGNOSIS — F32A Depression, unspecified: Secondary | ICD-10-CM

## 2023-11-30 DIAGNOSIS — M79641 Pain in right hand: Secondary | ICD-10-CM

## 2023-11-30 DIAGNOSIS — Z808 Family history of malignant neoplasm of other organs or systems: Secondary | ICD-10-CM | POA: Diagnosis not present

## 2023-11-30 DIAGNOSIS — Z23 Encounter for immunization: Secondary | ICD-10-CM

## 2023-11-30 DIAGNOSIS — Z803 Family history of malignant neoplasm of breast: Secondary | ICD-10-CM | POA: Diagnosis not present

## 2023-11-30 LAB — CBC WITH DIFFERENTIAL (CANCER CENTER ONLY)
Abs Immature Granulocytes: 0.01 K/uL (ref 0.00–0.07)
Basophils Absolute: 0.1 K/uL (ref 0.0–0.1)
Basophils Relative: 1 %
Eosinophils Absolute: 0.3 K/uL (ref 0.0–0.5)
Eosinophils Relative: 5 %
HCT: 43.5 % (ref 36.0–46.0)
Hemoglobin: 13.9 g/dL (ref 12.0–15.0)
Immature Granulocytes: 0 %
Lymphocytes Relative: 15 %
Lymphs Abs: 1 K/uL (ref 0.7–4.0)
MCH: 28.1 pg (ref 26.0–34.0)
MCHC: 32 g/dL (ref 30.0–36.0)
MCV: 88.1 fL (ref 80.0–100.0)
Monocytes Absolute: 0.6 K/uL (ref 0.1–1.0)
Monocytes Relative: 9 %
Neutro Abs: 4.3 K/uL (ref 1.7–7.7)
Neutrophils Relative %: 70 %
Platelet Count: 186 K/uL (ref 150–400)
RBC: 4.94 MIL/uL (ref 3.87–5.11)
RDW: 14.3 % (ref 11.5–15.5)
WBC Count: 6.3 K/uL (ref 4.0–10.5)
nRBC: 0 % (ref 0.0–0.2)

## 2023-11-30 LAB — IRON AND TIBC
Iron: 59 ug/dL (ref 28–170)
Saturation Ratios: 18 % (ref 10.4–31.8)
TIBC: 321 ug/dL (ref 250–450)
UIBC: 262 ug/dL

## 2023-11-30 LAB — FERRITIN: Ferritin: 129 ng/mL (ref 11–307)

## 2023-11-30 LAB — URIC ACID: Uric Acid, Serum: 6.2 mg/dL (ref 2.4–7.0)

## 2023-11-30 MED ORDER — ALPRAZOLAM 0.25 MG PO TABS: 0.2500 mg | ORAL_TABLET | Freq: Two times a day (BID) | ORAL | 2 refills | Status: DC | PRN

## 2023-12-01 ENCOUNTER — Ambulatory Visit: Payer: Self-pay | Admitting: Family

## 2023-12-01 ENCOUNTER — Telehealth: Payer: Self-pay

## 2023-12-01 NOTE — Telephone Encounter (Signed)
 Copied from CRM [id number]. Topic: Clinical - Lab/Test Results >> Dec 01, 2023  1:52 PM Viola F wrote: Reason for CRM: Patient requesting callback for lab/x-rays results from yesterday. Please call [phone] (H)

## 2023-12-02 ENCOUNTER — Telehealth: Payer: Self-pay

## 2023-12-02 NOTE — Progress Notes (Signed)
 Acute Office Visit  Subjective:     Patient ID: [name], adult    DOB: 10-06-1950, 73 y.o.   MRN: [id number]  Chief Complaint  Patient presents with   Medical Management of Chronic Issues    Swollen     HPI Patient is in today with c/o a swollen, red right hand without injury x 3 days. She is concerned about having gout. Pain 5/10 and worse with movement.   As of note, patient had a provoked fall several days ago at home but reports not injuring her right side. Fell over her walker. Only hurt her left ribs and nose. She declines further workup for the fall.    Past Medical History:  Diagnosis Date   Anemia    Anxiety    a.) on BZO (alprazolam ) PRN   Arthritis    Asthma    Atypical chest pain    a. 03/2018 MV: No ischemia/infarct. EF >65%.   Carotid arterial disease (HCC)    a. 03/2020 U/S: RICA <50, LICA nl.   Chronic heart failure with preserved ejection fraction (HFpEF) (HCC)    a. 08/2013 Echo: EF 60-65%; b. 08/2019 Echo: EF 60-65%; c. 03/2020 Echo: EF 60-65%; d. 05/2021 Echo: EF 55-60%, no rmwa, nl RV fxn, RVSP 54.52mmHg. Mildly dil LA, mild MR, mod TR.   Chronic, continuous use of opioids    a.) has naloxone  Rx available   CKD (chronic kidney disease), stage III (HCC)    Complication of anesthesia    a.) RIGHT hemidiaphragm paralysis s/p interscalene block for shoulder arthroplasty in 2017 --> caused permanent lung damage per patient --> REFUSES further neuraxial anesthesia   Cystocele    Deep vein thrombosis (DVT) of right lower extremity (HCC)    Depression    Diaphragm paralysis 2018   a.) RIGHT hemidiaphragm paralysis s/p interscalene block for shoulder arthroplasty in 2017; refuses additional regional/neuraxial anesthesia   DM (diabetes mellitus) type II, controlled, with peripheral vascular disorder (HCC) 08/31/2014   Dyspnea    11/08/2018 - more now due to lack of exercise   Edema    Fatty liver    GERD (gastroesophageal reflux disease)    Gout     Heart murmur    Hemiparesis affecting left side as late effect of cerebrovascular accident (HCC)    Hiatal hernia    a.) s/p repair   History of IBS    History of methicillin resistant staphylococcus aureus (MRSA) 2008   History of multiple strokes    Hyperlipidemia    Hypertension    Hypothyroidism    Insomnia    Long term current use of anticoagulant    a.) apixaban    Mitral regurgitation    a. 05/2021 Echo: Mild MR.   Nephrolithiasis    Neuropathy involving both lower extremities    Non-healing ulcer of left foot (HCC)    Obesity, Class II, BMI 35-39.9, with comorbidity    OSA on CPAP    PAF (paroxysmal atrial fibrillation) (HCC)    a.) 07/2013 Event monitor: Freq PVCs and 3 short runs of Afib; b.) CHA2DS2VASc = 8 (age, sex, CHF, HTN, CVA/DVT x2, vascular disease history, T2DM);  b.) rate/rhythm maintained without pharmacological intervention; chronically anticoagulated with apixaban    PAH (pulmonary artery hypertension) (HCC)    a. 05/2021 Echo: RVSP 54.38mmHg.   Peripheral vascular disease (HCC)    a. 03/2018 LE Angio: Nl renal arteries. Nl aorta & iliacs. Nl LLE arterial flow; b. 06/2020 ABI's: Nl bilat.  T2DM (type 2 diabetes mellitus) (HCC)    Tricuspid regurgitation    a. 05/2021 Echo: Mod TR (in setting of RVSP 54.7mmHg).   Umbilical hernia    a.) s/p repair   Urinary incontinence    Venous stasis dermatitis of both lower extremities     Social History   Socioeconomic History   Marital status: Married    Spouse name: Not on file   Number of children: Not on file   Years of education: Not on file   Highest education level: Not on file  Occupational History   Not on file  Tobacco Use   Smoking status: Never   Smokeless tobacco: Never  Vaping Use   Vaping status: Never Used  Substance and Sexual Activity   Alcohol  use: No    Alcohol /week: 0.0 standard drinks of alcohol    Drug use: Never   Sexual activity: Not Currently  Other Topics Concern   Not on file   Social History Narrative   She is currently married -- for 30+ years. Does not work. Does not smoke or take alcohol . She never smoked. She exercises at least 3 days a week since before her gastric surgery.   Marital status reviewed in history of present illness.   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    Social Drivers of Corporate investment banker Strain: Low Risk  (02/24/2023)   Overall Financial Resource Strain (CARDIA)    Difficulty of Paying Living Expenses: Not hard at all  Food Insecurity: No Food Insecurity (02/24/2023)   Hunger Vital Sign    Worried About Running Out of Food in the Last Year: Never true    Ran Out of Food in the Last Year: Never true  Transportation Needs: No Transportation Needs (02/24/2023)   PRAPARE - Administrator, Civil Service (Medical): No    Lack of Transportation (Non-Medical): No  Physical Activity: Inactive (02/24/2023)   Exercise Vital Sign    Days of Exercise per Week: 0 days    Minutes of Exercise per Session: 0 min  Stress: No Stress Concern Present (02/24/2023)   [employer] of Occupational Health - Occupational Stress Questionnaire    Feeling of Stress : Only a little  Social Connections: Moderately Isolated (02/24/2023)   Social Connection and Isolation Panel    Frequency of Communication with Friends and Family: More than three times a week    Frequency of Social Gatherings with Friends and Family: Once a week    Attends Religious Services: Never    Database administrator or Organizations: No    Attends Banker Meetings: Never    Marital Status: Married  Catering manager Violence: Not At Risk (02/24/2023)   Humiliation, Afraid, Rape, and Kick questionnaire    Fear of Current or Ex-Partner: No    Emotionally Abused: No    Physically Abused: No    Sexually Abused: No    Past Surgical History:  Procedure Laterality Date   ANKLE SURGERY Right     APPLICATION OF WOUND VAC Left 06/27/2015   Procedure: APPLICATION OF WOUND VAC ( POSSIBLE ) ;  Surgeon: Selinda GORMAN Gu, MD;  Location: [facility] ORS;  Service: Vascular;  Laterality: Left;   APPLICATION OF WOUND VAC Left 09/25/2016   Procedure: APPLICATION OF WOUND VAC;  Surgeon: Lilli Cough, DPM;  Location: [facility] ORS;  Service: Podiatry;  Laterality: Left;   arm  surgery     right     CHOLECYSTECTOMY     COLONOSCOPY     COLONOSCOPY WITH PROPOFOL  N/A 01/11/2017   Procedure: COLONOSCOPY WITH PROPOFOL ;  Surgeon: Gaylyn Gladis PENNER, MD;  Location: [facility] ENDOSCOPY;  Service: Endoscopy;  Laterality: N/A;   COLONOSCOPY WITH PROPOFOL  N/A 02/22/2017   Procedure: COLONOSCOPY WITH PROPOFOL ;  Surgeon: Gaylyn Gladis PENNER, MD;  Location: [facility] ENDOSCOPY;  Service: Endoscopy;  Laterality: N/A;   COLONOSCOPY WITH PROPOFOL  N/A 05/31/2017   Procedure: COLONOSCOPY WITH PROPOFOL ;  Surgeon: Gaylyn Gladis PENNER, MD;  Location: [facility] ENDOSCOPY;  Service: Endoscopy;  Laterality: N/A;   COLONOSCOPY WITH PROPOFOL  N/A 01/26/2022   Procedure: COLONOSCOPY WITH PROPOFOL ;  Surgeon: Maryruth Ole DASEN, MD;  Location: [facility] ENDOSCOPY;  Service: Endoscopy;  Laterality: N/A;   ESOPHAGOGASTRODUODENOSCOPY (EGD) WITH PROPOFOL  N/A 01/11/2017   Procedure: ESOPHAGOGASTRODUODENOSCOPY (EGD) WITH PROPOFOL ;  Surgeon: Gaylyn Gladis PENNER, MD;  Location: [facility] ENDOSCOPY;  Service: Endoscopy;  Laterality: N/A;   ESOPHAGOGASTRODUODENOSCOPY (EGD) WITH PROPOFOL  N/A 10/25/2018   Procedure: ESOPHAGOGASTRODUODENOSCOPY (EGD) WITH PROPOFOL ;  Surgeon: Gaylyn Gladis PENNER, MD;  Location: [facility] ENDOSCOPY;  Service: Endoscopy;  Laterality: N/A;   ESOPHAGOGASTRODUODENOSCOPY (EGD) WITH PROPOFOL  N/A 11/29/2019   Procedure: ESOPHAGOGASTRODUODENOSCOPY (EGD) WITH PROPOFOL ;  Surgeon: Toledo, Ladell POUR, MD;  Location: [facility] ENDOSCOPY;  Service: Gastroenterology;  Laterality: N/A;   ESOPHAGOGASTRODUODENOSCOPY (EGD) WITH PROPOFOL  N/A 01/26/2022   Procedure:  ESOPHAGOGASTRODUODENOSCOPY (EGD) WITH PROPOFOL ;  Surgeon: Maryruth Ole DASEN, MD;  Location: [facility] ENDOSCOPY;  Service: Endoscopy;  Laterality: N/A;  DM   HIATAL HERNIA REPAIR     I & D EXTREMITY Left 06/27/2015   Procedure: IRRIGATION AND DEBRIDEMENT EXTREMITY            ( CALF HEMATOMA ) POSSIBLE WOUND VAC;  Surgeon: Selinda GORMAN Gu, MD;  Location: [facility] ORS;  Service: Vascular;  Laterality: Left;   INCISION AND DRAINAGE OF WOUND Left 09/25/2016   Procedure: IRRIGATION AND DEBRIDEMENT - PARTIAL RESECTION OF ACHILLES TENDON WITH WOUND VAC APPLICATION;  Surgeon: Lilli Cough, DPM;  Location: [facility];  Service: Podiatry;  Laterality: Left;   INCISION AND DRAINAGE OF WOUND Left 11/09/2018   Procedure: Excision of left foot wound with ACell placement;  Surgeon: Lowery Estefana GORMAN, DO;  Location: [facility] OR;  Service: Plastics;  Laterality: Left;   IRRIGATION AND DEBRIDEMENT ABSCESS Left 09/07/2016   Procedure: IRRIGATION AND DEBRIDEMENT ABSCESS LEFT HEEL;  Surgeon: Lilli Cough, DPM;  Location: [facility] ORS;  Service: Podiatry;  Laterality: Left;   KIDNEY STONE SURGERY Right    LITHOTRIPSY     LOWER EXTREMITY ANGIOGRAPHY Left 04/04/2018   Procedure: LOWER EXTREMITY ANGIOGRAPHY;  Surgeon: Gu Selinda GORMAN, MD;  Location: [facility] INVASIVE CV LAB;  Service: Cardiovascular;  Laterality: Left;   NM GATED MYOVIEW  ([facility] HX)  04/2015   Likely breast attenuation. LOW RISK study. Normal EF 55-60%.   OTHER SURGICAL HISTORY     08/2016 or 09/2016 surgery on achilles tenso h/o staph infection heal. Dr. Lilli   removal of left hematoma Left    leg   REVERSE SHOULDER ARTHROPLASTY Right 10/08/2015   Procedure: REVERSE SHOULDER ARTHROPLASTY;  Surgeon: Norleen JINNY Maltos, MD;  Location: [facility] ORS;  Service: Orthopedics;  Laterality: Right;   REVERSE SHOULDER ARTHROPLASTY Left 09/01/2022   Procedure: REVERSE SHOULDER ARTHROPLASTY;  Surgeon: Tobie Priest, MD;  Location: [facility] ORS;  Service: Orthopedics;  Laterality: Left;   SLEEVE  GASTROPLASTY     TONSILLECTOMY     TOTAL KNEE ARTHROPLASTY Left    TOTAL  KNEE ARTHROPLASTY  2013   TOTAL SHOULDER REPLACEMENT Right    TRANSESOPHAGEAL ECHOCARDIOGRAM  08/08/2013   Mild LVH, EF 60-65%. Moderate LA dilation and mild RA dilation. Mild MR with no evidence of stenosis and no evidence of endocarditis. A false chordae is noted.   TRANSTHORACIC ECHOCARDIOGRAM  08/03/2013   Mild-Moderate concentric LVH, EF 60-65%. Normal diastolic function. Mild LA dilation. Mild MR with possible vegetation  - not confirmed on TEE    ULNAR NERVE TRANSPOSITION Right 08/06/2016   Procedure: ULNAR NERVE DECOMPRESSION/TRANSPOSITION;  Surgeon: Edie Norleen PARAS, MD;  Location: [facility] ORS;  Service: Orthopedics;  Laterality: Right;   UMBILICAL HERNIA REPAIR     UPPER GI ENDOSCOPY     VAGINAL HYSTERECTOMY      Family History  Problem Relation Age of Onset   Skin cancer Father    Diabetes Father    Hypertension Father    Peripheral vascular disease Father    Cancer Father    Cerebral aneurysm Father    Alcohol  abuse Father    Varicose Veins Mother    Kidney disease Mother    Arthritis Mother    Mental illness Sister    Cancer Maternal Aunt        breast   Breast cancer Maternal Aunt    Arthritis Maternal Grandmother    Hypertension Maternal Grandmother    Diabetes Maternal Grandmother    Arthritis Maternal Grandfather    Heart disease Maternal Grandfather    Hypertension Maternal Grandfather    Arthritis Paternal Grandmother    Hypertension Paternal Grandmother    Diabetes Paternal Grandmother    Arthritis Paternal Grandfather    Hypertension Paternal Grandfather    Bladder Cancer Neg Hx    Kidney cancer Neg Hx     Allergies  Allergen Reactions   Morphine  And Codeine Other (See Comments) and Nausea Only    Patient becomes very confused Other reaction(s): Other (See Comments), Other (See Comments) Patient becomes very confused Patient becomes very confused    Penicillins Hives, Other  (See Comments), Shortness Of Breath and Swelling    Tolerates Keflex  and Ceftriaxone   Other Reaction(s): Not available   Ambien  [Zolpidem ]     Hallucination    Aspirin  Hives   Oxytetracycline Hives    Other reaction(s): Unknown    Morphine  Nausea Only     BP 120/78   Pulse 88   Temp 98 F (36.7 C) (Oral)   Ht 5' 4 (1.626 m)   Wt 218 lb (98.9 kg)   SpO2 97%   BMI 37.42 kg/m chart   Review of Systems  Musculoskeletal:        Right hand pain and swelling  Neurological: Negative.   All other systems reviewed and are negative.  BP 120/78   Pulse 88   Temp 98 F (36.7 C) (Oral)   Ht 5' 4 (1.626 m)   Wt 218 lb (98.9 kg)   SpO2 97%   BMI 37.42 kg/m chart      Objective:    BP 120/78   Pulse 88   Temp 98 F (36.7 C) (Oral)   Ht 5' 4 (1.626 m)   Wt 218 lb (98.9 kg)   SpO2 97%   BMI 37.42 kg/m    Physical Exam Vitals and nursing note reviewed.  Constitutional:      Appearance: Normal appearance. She is normal weight.  Cardiovascular:     Rate and Rhythm: Normal rate. Rhythm irregular.  Pulmonary:  Effort: Pulmonary effort is normal.     Breath sounds: Normal breath sounds.  Musculoskeletal:        General: Swelling and tenderness present.     Comments: Right hand: swollen, erythematous. Pain with ROM. No signs of puncture or injury  Skin:    General: Skin is warm and dry.  Neurological:     General: No focal deficit present.     Mental Status: She is alert and oriented to person, place, and time. Mental status is at baseline.  Psychiatric:        Mood and Affect: Mood normal.        Behavior: Behavior normal.        Thought Content: Thought content normal.    Results for orders placed or performed in visit on 11/30/23  Iron  and TIBC  Result Value Ref Range   Iron  59 28 - 170 ug/dL   TIBC 678 749 - 549 ug/dL   Saturation Ratios 18 10.4 - 31.8 %   UIBC 262 ug/dL  Ferritin  Result Value Ref Range   Ferritin 129 11 - 307 ng/mL  CBC with  Differential (Cancer Center Only)  Result Value Ref Range   WBC Count 6.3 4.0 - 10.5 K/uL   RBC 4.94 3.87 - 5.11 MIL/uL   Hemoglobin 13.9 12.0 - 15.0 g/dL   HCT 56.4 63.9 - 53.9 %   MCV 88.1 80.0 - 100.0 fL   MCH 28.1 26.0 - 34.0 pg   MCHC 32.0 30.0 - 36.0 g/dL   RDW 85.6 88.4 - 84.4 %   Platelet Count 186 150 - 400 K/uL   nRBC 0.0 0.0 - 0.2 %   Neutrophils Relative % 70 %   Neutro Abs 4.3 1.7 - 7.7 K/uL   Lymphocytes Relative 15 %   Lymphs Abs 1.0 0.7 - 4.0 K/uL   Monocytes Relative 9 %   Monocytes Absolute 0.6 0.1 - 1.0 K/uL   Eosinophils Relative 5 %   Eosinophils Absolute 0.3 0.0 - 0.5 K/uL   Basophils Relative 1 %   Basophils Absolute 0.1 0.0 - 0.1 K/uL   Immature Granulocytes 0 %   Abs Immature Granulocytes 0.01 0.00 - 0.07 K/uL  Results for orders placed or performed in visit on 11/30/23  Uric acid  Result Value Ref Range   Uric Acid, Serum 6.2 2.4 - 7.0 mg/dL        Assessment & Plan:   Problem List Items Addressed This Visit   None Visit Diagnoses       Need for influenza vaccination    -  Primary   Relevant Orders   Flu vaccine HIGH DOSE PF(Fluzone Trivalent) (Completed)     Anxiety and depression       Relevant Medications   ALPRAZolam  (XANAX ) 0.25 MG tablet     Right hand pain       Relevant Orders   DG Hand Complete Right   Uric acid (Completed)     Swelling of joint of right hand       Relevant Orders   DG Hand Complete Right   Uric acid (Completed)       Meds ordered this encounter  Medications   ALPRAZolam  (XANAX ) 0.25 MG tablet    Sig: Take 1 tablet (0.25 mg total) by mouth 2 (two) times daily as needed for anxiety. See SIG change was taking 1/2 of 0.5 tablet bid prn changed to 0.25 bid prn. D/c 0.5 xanax     Dispense:  60 tablet    Refill:  2    PT NEED REFILL ON NEXT FILL   Call the office with any questions, concerns, or if symptoms worsen or persist. Recheck pending labs, as scheduled and sooner as needed.  No follow-ups on  file.  Giavanni Odonovan B Alaya Iverson, FNP

## 2023-12-02 NOTE — Telephone Encounter (Signed)
 Copied from CRM #[id number]. Topic: Clinical - Order For Equipment >> Dec 02, 2023  2:06 PM [name] wrote: Reason for CRM: Patient is putting in a request for  Hemi walker

## 2023-12-02 NOTE — Telephone Encounter (Signed)
 Copied from CRM [id number]. Topic: Clinical - Lab/Test Results >> Dec 02, 2023 12:21 PM Thersia BROCKS wrote: Reason for CRM: Patient called in regarding lab results, relay message . Patient understood and had no further questions

## 2023-12-07 ENCOUNTER — Encounter: Payer: Self-pay | Admitting: Medical

## 2023-12-07 ENCOUNTER — Ambulatory Visit: Attending: Medical | Admitting: Medical

## 2023-12-07 VITALS — BP 153/81 | HR 72 | Ht 65.0 in | Wt 223.4 lb

## 2023-12-07 DIAGNOSIS — I4821 Permanent atrial fibrillation: Secondary | ICD-10-CM

## 2023-12-07 DIAGNOSIS — N183 Chronic kidney disease, stage 3 unspecified: Secondary | ICD-10-CM | POA: Diagnosis not present

## 2023-12-07 DIAGNOSIS — I10 Essential (primary) hypertension: Secondary | ICD-10-CM | POA: Diagnosis not present

## 2023-12-07 DIAGNOSIS — W19XXXA Unspecified fall, initial encounter: Secondary | ICD-10-CM

## 2023-12-07 DIAGNOSIS — I5032 Chronic diastolic (congestive) heart failure: Secondary | ICD-10-CM

## 2023-12-07 NOTE — Progress Notes (Signed)
 Cardiology Office Note   Date:  12/07/2023  ID:  [name], DOB Aug 03, 1950, MRN [id number] PCP: Gretel App, NP  [facility] Providers Cardiologist:  [name] Cage, MD  History of Present Illness [name] is a 73 y.o. adult with a hx of permanent atrial fibrillation, chronic diastolic heart failure, diabetes type 2, hypertension, hyperlipidemia, obesity, CKD stage III, sleep apnea on CPAP, previous DVT who presents for follow-up.   In the setting of atypical chest pain, she underwent stress testing in January 2020, which was nonischemic.  In June 2021, she was admitted with pneumonia, sepsis, mild troponin elevation.  Echo at that time showed an EF of 60 to 65% without wall motion abnormalities.  It was felt troponin elevation was secondary to demand ischemia.  In September 2021, she was admitted with altered mental status and left upper extremity weakness.  MRI showed bilateral, small, multiple acute strokes suggestive of embolic etiology.  She is on Pradaxa  at that time and is transition to Eliquis .  Unfortunately she suffered another stroke in November 2021, neurology suspected recurrent stroke due to severe malnutrition given history of gastric sleeve and noncompliance with vitamins.  In January 2022, she was admitted again for acute mental status and concern for stroke, though this was ruled out.  Echo with bubble study showed normal LV function without evidence of ASD or PFO.  In the setting of progressive weight gain and edema, echo was performed.  Echo November 2022 showed an EF of 55 to 60%, mild MR, moderate TR.  In March 2023, she required admission secondary to lower extremity cellulitis and acute on chronic HFpEF.  She was treated with IV antibiotics and diuresed 13 pounds.  Echo from 2023 showed EF of 55 to 60%, indeterminate diastolic parameters, moderately elevated pulmonary artery systolic pressure, mild MR, moderate TR.  The patient underwent left shoulder surgery  in June 2024 and attended rehab for 10 days.  Patient was last seen 02/02/2023 for recent fall.  She was taking Lasix  20 mg daily.  Today, the patient reports she recently had a gout flareup and took colchicine . She denies chest pain, SOB, or lower leg edema. She had a fall 2 weeks ago.  She was standing at the doorway and when she turned around she fell on the walker and hurt her right side. She didn't hit her head. She uses a hemi-walker and has been trying to transition to a large stick. She has very rare uncomfortable feeling in her chest that is improved with her CPAP.   Studies Reviewed EKG Interpretation Date/Time:  Tuesday December 07 2023 13:37:15 EDT Ventricular Rate:  72 PR Interval:    QRS Duration:  76 QT Interval:  418 QTC Calculation: 457 R Axis:   96  Text Interpretation: Atrial fibrillation Rightward axis Nonspecific ST and T wave abnormality When compared with ECG of 02-Feb-2023 15:52, Inverted T waves have replaced nonspecific T wave abnormality in Inferior leads Confirmed by Franchester, Taran Hable ([id number]) on 12/07/2023 1:43:44 PM    Echo 05/2021 1. Left ventricular ejection fraction, by estimation, is 55 to 60%. The  left ventricle has normal function. The left ventricle has no regional  wall motion abnormalities. Left ventricular diastolic parameters are  indeterminate.   2. Right ventricular systolic function is normal. The right ventricular  size is normal. There is moderately elevated pulmonary artery systolic  pressure. The estimated right ventricular systolic pressure is 54.4 mmHg.   3. Left atrial size was mildly dilated.  4. The mitral valve is normal in structure. Mild mitral valve  regurgitation. No evidence of mitral stenosis.   5. Tricuspid valve regurgitation is moderate.   6. The aortic valve is normal in structure. Aortic valve regurgitation is  not visualized. No aortic stenosis is present.   7. The inferior vena cava is dilated in size with >50%  respiratory  variability, suggesting right atrial pressure of 8 mmHg.   Physical Exam VS:  BP (!) 153/81   Pulse 72   Ht 5' 5 (1.651 m)   Wt 223 lb 6.4 oz (101.3 kg)   SpO2 92%   BMI 37.18 kg/m        Wt Readings from Last 3 Encounters:  12/07/23 223 lb 6.4 oz (101.3 kg)  11/30/23 218 lb (98.9 kg)  07/21/23 228 lb 9.6 oz (103.7 kg)    GEN: Well nourished, well developed in no acute distress NECK: No JVD; No carotid bruits CARDIAC: Irreg Irreg, no murmurs, rubs, gallops RESPIRATORY:  Clear to auscultation without rales, wheezing or rhonchi  ABDOMEN: Soft, non-tender, non-distended EXTREMITIES:  No edema; No deformity   ASSESSMENT AND PLAN  Permanent Afib EKG shows rate controlled Afib. Continue Eliquis  5mg  BID for stroke ppx.   Chronic diastolic heart failure Patient appears euvolemic on exam. Continue Jardiance  10mg  daily, lasix  40mg  daily and   HTN BP is elevated today, but this is abnormal for the patient. Continue Losartan  25mg  daily.   CKD stage 3 Scr 1.63 on most recent labs.   Fall Patient has two recent mechanical falls. She denies significant injuries and has not sustained significant injuries.         Dispo: Follow-up in 6 months  Signed, Tikisha Molinaro VEAR Fishman, PA-C

## 2023-12-07 NOTE — Patient Instructions (Signed)
 Medication Instructions:  Your physician recommends that you continue on your current medications as directed. Please refer to the Current Medication list given to you today.    *If you need a refill on your cardiac medications before your next appointment, please call your pharmacy*  Lab Work: No labs ordered today   Testing/Procedures: No test ordered today   Follow-Up: At [facility], you and your health needs are our priority.  As part of our continuing mission to provide you with exceptional heart care, our providers are all part of one team.  This team includes your primary Cardiologist (physician) and Advanced Practice Providers or APPs (Physician Assistants and Nurse Practitioners) who all work together to provide you with the care you need, when you need it.  Your next appointment:   6 month(s)  Provider:   Antionette Kirks, MD or Cadence Gennaro Khat, PA-C

## 2023-12-10 ENCOUNTER — Inpatient Hospital Stay (HOSPITAL_BASED_OUTPATIENT_CLINIC_OR_DEPARTMENT_OTHER): Admitting: Oncology

## 2023-12-10 ENCOUNTER — Inpatient Hospital Stay

## 2023-12-10 ENCOUNTER — Encounter: Payer: Self-pay | Admitting: Oncology

## 2023-12-10 VITALS — BP 121/76 | HR 80 | Temp 98.3°F | Resp 20 | Ht 65.0 in | Wt 223.0 lb

## 2023-12-10 DIAGNOSIS — D509 Iron deficiency anemia, unspecified: Secondary | ICD-10-CM | POA: Diagnosis not present

## 2023-12-10 NOTE — Progress Notes (Signed)
 Hematology/Oncology Consult note [facility]  [phone] [phone]  Patient Care Team: Gretel App, NP as PCP - General (Nurse Practitioner) Darron [name] LABOR, MD as PCP - Cardiology (Cardiology) Tanya Glisson, MD as Consulting Physician (Pain Medicine) Anner Alm ORN, MD as Consulting Physician (Cardiology) Poggi, Norleen PARAS, MD as Consulting Physician (Surgery) Melanee Annah BROCKS, MD as Consulting Physician (Oncology) Tobie Priest, MD as Consulting Physician (Orthopedic Surgery)   Name of the patient: [name]  [id number]  1950/12/22   Date of visit: 12/10/23  Diagnosis-iron  deficiency anemia  Chief complaint/ Reason for visit-routine follow-up of iron  deficiency anemia  Heme/Onc history:  Patient is a 73 year old female with a history of sleep apnea, bronchitis, diabetes CVA who has been referred for anemia.  Most recent CBC from June 2022 showed white cell count of 7.5, H&H of 11.1/34.5 with an MCV of 80.9 and a platelet count of 257.  Looking back at her prior CBCs her hemoglobin has been mostly between 9.5-10.5.  B12 levels in June was greater than 1550.  Folate was normal iron  studies showed a low ferritin of 18 and iron  saturation of 11%.  Patient currently reports fatigue which has been more than usual along with exertional shortness of breath.  Denies any blood loss in her stool or urine.  Denies any dark melanotic stools.  Last EGD was in September 2021 by Dr. Aundria which showed possible candidiasis, benign-appearing esophageal stenosis and multiple gastric polyps which were negative for malignancy.  Last colonoscopy was in March 2019 which showed diverticulosis and polyps but no evidence of active bleeding.   Results of blood workroutine follow-up of anemia from 03/25/2021 were as follows: CBC showed white cell count of 7.6, H&H of 8.6/29.6 with an MCV of 78 and a platelet count of 239.  Ferritin levels were low at 6.  B12  level normal at 563.  No evidence of hemolysis.  Myeloma panel showed no evidence of M protein.  TSH was mildly low at 0.14.  Patient received IV iron  Venofer  in February 2023.  Interval history-patient presently feels well and does not report significant fatigue.  Denies any changes in her bowel habits.  Denies any blood loss in her stool or urine  ECOG PS- 2 Pain scale- 0   Review of systems- Review of Systems  Constitutional:  Negative for chills, fever, malaise/fatigue and weight loss.  HENT:  Negative for congestion, ear discharge and nosebleeds.   Eyes:  Negative for blurred vision.  Respiratory:  Negative for cough, hemoptysis, sputum production, shortness of breath and wheezing.   Cardiovascular:  Negative for chest pain, palpitations, orthopnea and claudication.  Gastrointestinal:  Negative for abdominal pain, blood in stool, constipation, diarrhea, heartburn, melena, nausea and vomiting.  Genitourinary:  Negative for dysuria, flank pain, frequency, hematuria and urgency.  Musculoskeletal:  Negative for back pain, joint pain and myalgias.  Skin:  Negative for rash.  Neurological:  Negative for dizziness, tingling, focal weakness, seizures, weakness and headaches.  Endo/Heme/Allergies:  Does not bruise/bleed easily.  Psychiatric/Behavioral:  Negative for depression and suicidal ideas. The patient does not have insomnia.       Allergies  Allergen Reactions   Morphine  And Codeine Other (See Comments) and Nausea Only    Patient becomes very confused Other reaction(s): Other (See Comments), Other (See Comments) Patient becomes very confused Patient becomes very confused    Penicillins Hives, Other (See Comments), Shortness Of Breath and Swelling    Tolerates Keflex   and Ceftriaxone   Other Reaction(s): Not available   Ambien  Alary.Ana ]     Hallucination    Aspirin  Hives   Oxytetracycline Hives    Other reaction(s): Unknown    Morphine  Nausea Only     Past Medical  History:  Diagnosis Date   Anemia    Anxiety    a.) on BZO (alprazolam ) PRN   Arthritis    Asthma    Atypical chest pain    a. 03/2018 MV: No ischemia/infarct. EF >65%.   Carotid arterial disease (HCC)    a. 03/2020 U/S: RICA <50, LICA nl.   Chronic heart failure with preserved ejection fraction (HFpEF) (HCC)    a. 08/2013 Echo: EF 60-65%; b. 08/2019 Echo: EF 60-65%; c. 03/2020 Echo: EF 60-65%; d. 05/2021 Echo: EF 55-60%, no rmwa, nl RV fxn, RVSP 54.62mmHg. Mildly dil LA, mild MR, mod TR.   Chronic, continuous use of opioids    a.) has naloxone  Rx available   CKD (chronic kidney disease), stage III (HCC)    Complication of anesthesia    a.) RIGHT hemidiaphragm paralysis s/p interscalene block for shoulder arthroplasty in 2017 --> caused permanent lung damage per patient --> REFUSES further neuraxial anesthesia   Cystocele    Deep vein thrombosis (DVT) of right lower extremity (HCC)    Depression    Diaphragm paralysis 2018   a.) RIGHT hemidiaphragm paralysis s/p interscalene block for shoulder arthroplasty in 2017; refuses additional regional/neuraxial anesthesia   DM (diabetes mellitus) type II, controlled, with peripheral vascular disorder (HCC) 08/31/2014   Dyspnea    11/08/2018 - more now due to lack of exercise   Edema    Fatty liver    GERD (gastroesophageal reflux disease)    Gout    Heart murmur    Hemiparesis affecting left side as late effect of cerebrovascular accident (HCC)    Hiatal hernia    a.) s/p repair   History of IBS    History of methicillin resistant staphylococcus aureus (MRSA) 2008   History of multiple strokes    Hyperlipidemia    Hypertension    Hypothyroidism    Insomnia    Long term current use of anticoagulant    a.) apixaban    Mitral regurgitation    a. 05/2021 Echo: Mild MR.   Nephrolithiasis    Neuropathy involving both lower extremities    Non-healing ulcer of left foot (HCC)    Obesity, Class II, BMI 35-39.9, with comorbidity    OSA on  CPAP    PAF (paroxysmal atrial fibrillation) (HCC)    a.) 07/2013 Event monitor: Freq PVCs and 3 short runs of Afib; b.) CHA2DS2VASc = 8 (age, sex, CHF, HTN, CVA/DVT x2, vascular disease history, T2DM);  b.) rate/rhythm maintained without pharmacological intervention; chronically anticoagulated with apixaban    PAH (pulmonary artery hypertension) (HCC)    a. 05/2021 Echo: RVSP 54.6mmHg.   Peripheral vascular disease (HCC)    a. 03/2018 LE Angio: Nl renal arteries. Nl aorta & iliacs. Nl LLE arterial flow; b. 06/2020 ABI's: Nl bilat.   T2DM (type 2 diabetes mellitus) (HCC)    Tricuspid regurgitation    a. 05/2021 Echo: Mod TR (in setting of RVSP 54.73mmHg).   Umbilical hernia    a.) s/p repair   Urinary incontinence    Venous stasis dermatitis of both lower extremities      Past Surgical History:  Procedure Laterality Date   ANKLE SURGERY Right    APPLICATION OF WOUND VAC Left 06/27/2015   Procedure:  APPLICATION OF WOUND VAC ( POSSIBLE ) ;  Surgeon: Selinda GORMAN Gu, MD;  Location: [facility] ORS;  Service: Vascular;  Laterality: Left;   APPLICATION OF WOUND VAC Left 09/25/2016   Procedure: APPLICATION OF WOUND VAC;  Surgeon: Lilli Cough, DPM;  Location: [facility] ORS;  Service: Podiatry;  Laterality: Left;   arm surgery     right     CHOLECYSTECTOMY     COLONOSCOPY     COLONOSCOPY WITH PROPOFOL  N/A 01/11/2017   Procedure: COLONOSCOPY WITH PROPOFOL ;  Surgeon: Gaylyn Gladis PENNER, MD;  Location: [facility] ENDOSCOPY;  Service: Endoscopy;  Laterality: N/A;   COLONOSCOPY WITH PROPOFOL  N/A 02/22/2017   Procedure: COLONOSCOPY WITH PROPOFOL ;  Surgeon: Gaylyn Gladis PENNER, MD;  Location: [facility] ENDOSCOPY;  Service: Endoscopy;  Laterality: N/A;   COLONOSCOPY WITH PROPOFOL  N/A 05/31/2017   Procedure: COLONOSCOPY WITH PROPOFOL ;  Surgeon: Gaylyn Gladis PENNER, MD;  Location: [facility] ENDOSCOPY;  Service: Endoscopy;  Laterality: N/A;   COLONOSCOPY WITH PROPOFOL  N/A 01/26/2022   Procedure: COLONOSCOPY WITH PROPOFOL ;  Surgeon:  Maryruth Ole DASEN, MD;  Location: [facility] ENDOSCOPY;  Service: Endoscopy;  Laterality: N/A;   ESOPHAGOGASTRODUODENOSCOPY (EGD) WITH PROPOFOL  N/A 01/11/2017   Procedure: ESOPHAGOGASTRODUODENOSCOPY (EGD) WITH PROPOFOL ;  Surgeon: Gaylyn Gladis PENNER, MD;  Location: [facility] ENDOSCOPY;  Service: Endoscopy;  Laterality: N/A;   ESOPHAGOGASTRODUODENOSCOPY (EGD) WITH PROPOFOL  N/A 10/25/2018   Procedure: ESOPHAGOGASTRODUODENOSCOPY (EGD) WITH PROPOFOL ;  Surgeon: Gaylyn Gladis PENNER, MD;  Location: [facility] ENDOSCOPY;  Service: Endoscopy;  Laterality: N/A;   ESOPHAGOGASTRODUODENOSCOPY (EGD) WITH PROPOFOL  N/A 11/29/2019   Procedure: ESOPHAGOGASTRODUODENOSCOPY (EGD) WITH PROPOFOL ;  Surgeon: Toledo, Ladell POUR, MD;  Location: [facility] ENDOSCOPY;  Service: Gastroenterology;  Laterality: N/A;   ESOPHAGOGASTRODUODENOSCOPY (EGD) WITH PROPOFOL  N/A 01/26/2022   Procedure: ESOPHAGOGASTRODUODENOSCOPY (EGD) WITH PROPOFOL ;  Surgeon: Maryruth Ole DASEN, MD;  Location: [facility] ENDOSCOPY;  Service: Endoscopy;  Laterality: N/A;  DM   HIATAL HERNIA REPAIR     I & D EXTREMITY Left 06/27/2015   Procedure: IRRIGATION AND DEBRIDEMENT EXTREMITY            ( CALF HEMATOMA ) POSSIBLE WOUND VAC;  Surgeon: Selinda GORMAN Gu, MD;  Location: [facility] ORS;  Service: Vascular;  Laterality: Left;   INCISION AND DRAINAGE OF WOUND Left 09/25/2016   Procedure: IRRIGATION AND DEBRIDEMENT - PARTIAL RESECTION OF ACHILLES TENDON WITH WOUND VAC APPLICATION;  Surgeon: Lilli Cough, DPM;  Location: [facility] ORS;  Service: Podiatry;  Laterality: Left;   INCISION AND DRAINAGE OF WOUND Left 11/09/2018   Procedure: Excision of left foot wound with ACell placement;  Surgeon: Lowery Estefana GORMAN, DO;  Location: [facility] OR;  Service: Plastics;  Laterality: Left;   IRRIGATION AND DEBRIDEMENT ABSCESS Left 09/07/2016   Procedure: IRRIGATION AND DEBRIDEMENT ABSCESS LEFT HEEL;  Surgeon: Lilli Cough, DPM;  Location: [facility] ORS;  Service: Podiatry;  Laterality: Left;   KIDNEY STONE SURGERY  Right    LITHOTRIPSY     LOWER EXTREMITY ANGIOGRAPHY Left 04/04/2018   Procedure: LOWER EXTREMITY ANGIOGRAPHY;  Surgeon: Gu Selinda GORMAN, MD;  Location: [facility] INVASIVE CV LAB;  Service: Cardiovascular;  Laterality: Left;   NM GATED MYOVIEW  ([facility] HX)  04/2015   Likely breast attenuation. LOW RISK study. Normal EF 55-60%.   OTHER SURGICAL HISTORY     08/2016 or 09/2016 surgery on achilles tenso h/o staph infection heal. Dr. Lilli   removal of left hematoma Left    leg   REVERSE SHOULDER ARTHROPLASTY Right 10/08/2015   Procedure: REVERSE SHOULDER ARTHROPLASTY;  Surgeon: Norleen JINNY Maltos, MD;  Location:  [facility] ORS;  Service: Orthopedics;  Laterality: Right;   REVERSE SHOULDER ARTHROPLASTY Left 09/01/2022   Procedure: REVERSE SHOULDER ARTHROPLASTY;  Surgeon: Tobie Priest, MD;  Location: [facility] ORS;  Service: Orthopedics;  Laterality: Left;   SLEEVE GASTROPLASTY     TONSILLECTOMY     TOTAL KNEE ARTHROPLASTY Left    TOTAL KNEE ARTHROPLASTY  2013   TOTAL SHOULDER REPLACEMENT Right    TRANSESOPHAGEAL ECHOCARDIOGRAM  08/08/2013   Mild LVH, EF 60-65%. Moderate LA dilation and mild RA dilation. Mild MR with no evidence of stenosis and no evidence of endocarditis. A false chordae is noted.   TRANSTHORACIC ECHOCARDIOGRAM  08/03/2013   Mild-Moderate concentric LVH, EF 60-65%. Normal diastolic function. Mild LA dilation. Mild MR with possible vegetation  - not confirmed on TEE    ULNAR NERVE TRANSPOSITION Right 08/06/2016   Procedure: ULNAR NERVE DECOMPRESSION/TRANSPOSITION;  Surgeon: Edie Norleen PARAS, MD;  Location: [facility] ORS;  Service: Orthopedics;  Laterality: Right;   UMBILICAL HERNIA REPAIR     UPPER GI ENDOSCOPY     VAGINAL HYSTERECTOMY      Social History   Socioeconomic History   Marital status: Married    Spouse name: Not on file   Number of children: Not on file   Years of education: Not on file   Highest education level: Not on file  Occupational History   Not on file  Tobacco Use   Smoking  status: Never   Smokeless tobacco: Never  Vaping Use   Vaping status: Never Used  Substance and Sexual Activity   Alcohol  use: No    Alcohol /week: 0.0 standard drinks of alcohol    Drug use: Never   Sexual activity: Not Currently  Other Topics Concern   Not on file  Social History Narrative   She is currently married -- for 30+ years. Does not work. Does not smoke or take alcohol . She never smoked. She exercises at least 3 days a week since before her gastric surgery.   Marital status reviewed in history of present illness.   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    Social Drivers of Corporate investment banker Strain: Low Risk  (02/24/2023)   Overall Financial Resource Strain (CARDIA)    Difficulty of Paying Living Expenses: Not hard at all  Food Insecurity: No Food Insecurity (02/24/2023)   Hunger Vital Sign    Worried About Running Out of Food in the Last Year: Never true    Ran Out of Food in the Last Year: Never true  Transportation Needs: No Transportation Needs (02/24/2023)   PRAPARE - Administrator, Civil Service (Medical): No    Lack of Transportation (Non-Medical): No  Physical Activity: Inactive (02/24/2023)   Exercise Vital Sign    Days of Exercise per Week: 0 days    Minutes of Exercise per Session: 0 min  Stress: No Stress Concern Present (02/24/2023)   [employer] of Occupational Health - Occupational Stress Questionnaire    Feeling of Stress : Only a little  Social Connections: Moderately Isolated (02/24/2023)   Social Connection and Isolation Panel    Frequency of Communication with Friends and Family: More than three times a week    Frequency of Social Gatherings with Friends and Family: Once a week    Attends Religious Services: Never    Database administrator or Organizations: No    Attends Banker Meetings:  Never    Marital Status: Married  Catering manager Violence: Not  At Risk (02/24/2023)   Humiliation, Afraid, Rape, and Kick questionnaire    Fear of Current or Ex-Partner: No    Emotionally Abused: No    Physically Abused: No    Sexually Abused: No    Family History  Problem Relation Age of Onset   Skin cancer Father    Diabetes Father    Hypertension Father    Peripheral vascular disease Father    Cancer Father    Cerebral aneurysm Father    Alcohol  abuse Father    Varicose Veins Mother    Kidney disease Mother    Arthritis Mother    Mental illness Sister    Cancer Maternal Aunt        breast   Breast cancer Maternal Aunt    Arthritis Maternal Grandmother    Hypertension Maternal Grandmother    Diabetes Maternal Grandmother    Arthritis Maternal Grandfather    Heart disease Maternal Grandfather    Hypertension Maternal Grandfather    Arthritis Paternal Grandmother    Hypertension Paternal Grandmother    Diabetes Paternal Grandmother    Arthritis Paternal Grandfather    Hypertension Paternal Grandfather    Bladder Cancer Neg Hx    Kidney cancer Neg Hx      Current Outpatient Medications:    acetaminophen  (TYLENOL ) 500 MG tablet, Take 2 tablets (1,000 mg total) by mouth every 8 (eight) hours., Disp: 30 tablet, Rfl: 0   albuterol  (VENTOLIN  HFA) 108 (90 Base) MCG/ACT inhaler, Inhale 2 puffs into the lungs every 6 (six) hours as needed., Disp: 8 g, Rfl: 4   ALPRAZolam  (XANAX ) 0.25 MG tablet, Take 1 tablet (0.25 mg total) by mouth 2 (two) times daily as needed for anxiety. See SIG change was taking 1/2 of 0.5 tablet bid prn changed to 0.25 bid prn. D/c 0.5 xanax , Disp: 60 tablet, Rfl: 2   ammonium lactate  (AMLACTIN DAILY) 12 % lotion, Apply 1 Application topically as needed., Disp: 400 g, Rfl: 0   buPROPion  (WELLBUTRIN  XL) 150 MG 24 hr tablet, Take 1 tablet (150 mg total) by mouth daily., Disp: 90 tablet, Rfl: 3   calcium -vitamin D  (OSCAL WITH D) 500-200 MG-UNIT tablet, Take 1 tablet by mouth 2 (two) times daily with a meal., Disp: 60  tablet, Rfl: 0   cephALEXin  (KEFLEX ) 250 MG capsule, TAKE 1 CAPSULE BY MOUTH ONCE DAILY, Disp: 90 capsule, Rfl: 0   clobetasol  cream (TEMOVATE ) 0.05 %, Apply 1 application topically 2 (two) times daily as needed (skin irritation). , Disp: , Rfl:    colchicine  0.6 MG tablet, Take 1 tablet (0.6 mg total) by mouth daily. Repeat I 1 hour if needed. No more than 3 tablets in 24 hours PRN for GOUT FLARE, Disp: 30 tablet, Rfl: 0   Continuous Glucose Sensor (FREESTYLE LIBRE 3 PLUS SENSOR) MISC, PLACE ONE SENSOR ON THE SKIN EVERY FOURTEEN DAYS.USE TO CHECK GLUCOSE CONTINUOUSLY, Disp: 6 each, Rfl: 3   cyanocobalamin  (VITAMIN B12) 1000 MCG tablet, Take 1,000 mcg by mouth daily., Disp: , Rfl:    diclofenac  Sodium (VOLTAREN ) 1 % GEL, Apply 2 g topically 3 (three) times daily., Disp: 100 g, Rfl: 2   DULoxetine  (CYMBALTA ) 60 MG capsule, Take 1 capsule (60 mg total) by mouth daily., Disp: 90 capsule, Rfl: 3   ELIQUIS  5 MG TABS tablet, TAKE 1 TABLET BY MOUTH TWICE DAILY, Disp: 180 tablet, Rfl: 1   empagliflozin  (JARDIANCE ) 10 MG TABS  tablet, Take 1 tablet (10 mg total) by mouth daily before breakfast., Disp: 90 tablet, Rfl: 3   ergocalciferol  (VITAMIN D2) 1.25 MG (50000 UT) capsule, Take 50,000 Units by mouth once a week., Disp: , Rfl:    estradiol  (ESTRACE ) 0.1 MG/GM vaginal cream, APPLY 0.5 MG (PEA-SIZED AMOUNT) JUST INSIDE THE VAGINAL INTROITUS WITH A FINGER-TIP ON MONDAY, WEDNESDAY, AND FRIDAY NIGHTS, Disp: 42.5 g, Rfl: 11   ferrous sulfate  324 MG TBEC, Take 324 mg by mouth daily with breakfast., Disp: , Rfl:    Fluticasone -Umeclidin-Vilant (TRELEGY ELLIPTA ) 200-62.5-25 MCG/ACT AEPB, INHALE ONE PUFF INTO THE LUNGS EVERY DAY, Disp: 60 each, Rfl: 5   folic acid  (FOLVITE ) 800 MCG tablet, Take 800 mcg by mouth daily., Disp: , Rfl:    furosemide  (LASIX ) 40 MG tablet, Take 1 tablet (40 mg total) by mouth as directed. Take 1 tablet daily. May take additional 1 tablet If weight gain of greater than 2 pounds in 24 hours or  5 pounds in one week., Disp: 90 tablet, Rfl: 1   glucose blood (ONETOUCH ULTRA) test strip, TEST TWICE DAILY AS DIRECTED, Disp: 100 each, Rfl: 11   ipratropium-albuterol  (DUONEB) 0.5-2.5 (3) MG/3ML SOLN, Take 3 mLs by nebulization every 6 (six) hours as needed., Disp: , Rfl:    Lancets (ONETOUCH ULTRASOFT) lancets, Use as instructed, Disp: 100 each, Rfl: 12   levocetirizine (XYZAL ) 5 MG tablet, TAKE ONE TABLET BY MOUTH AT BEDTIME AS NEEDED FOR ALLERGIES, Disp: 90 tablet, Rfl: 3   levothyroxine  (SYNTHROID ) 137 MCG tablet, TAKE ONE (1) TABLET BY MOUTH DAILY 30 MINUTES BEFORE BREAKFAST; DO NOT MIX WITH VITAMINS OR ACID REFLUX MEDICATIONSSTOP DOSE, Disp: 90 tablet, Rfl: 3   Loperamide -Simethicone  2-125 MG TABS, Take 1 tablet by mouth daily as needed. May repeat in 1 hour, Disp: , Rfl:    losartan  (COZAAR ) 25 MG tablet, Take 1 tablet (25 mg total) by mouth daily., Disp: 90 tablet, Rfl: 3   Magnesium  (CVS TRIPLE MAGNESIUM  COMPLEX) 400 MG CAPS, Take 1 capsule by mouth daily., Disp: , Rfl:    Misc. Devices (TUB TRANSFER BOARD) MISC, 1 Device by Does not apply route as needed., Disp: 1 each, Rfl: 0   montelukast  (SINGULAIR ) 10 MG tablet, TAKE 1 TABLET BY MOUTH NIGHTLY, Disp: 90 tablet, Rfl: 3   Multiple Vitamins-Minerals (HAIR SKIN & NAILS) TABS, Take 1 tablet by mouth daily., Disp: , Rfl:    nystatin  powder, Apply 1 Application topically 3 (three) times daily as needed., Disp: 60 g, Rfl: 5   ondansetron  (ZOFRAN ) 4 MG tablet, Take 1 tablet (4 mg total) by mouth every 8 (eight) hours as needed for nausea or vomiting., Disp: 90 tablet, Rfl: 1   pantoprazole  (PROTONIX ) 40 MG tablet, Take 1 tablet (40 mg total) by mouth daily. 30 min before food, Disp: 90 tablet, Rfl: 3   pregabalin  (LYRICA ) 75 MG capsule, TAKE ONE CAPSULE BY MOUTH THREE TIMES DAILY, Disp: 90 capsule, Rfl: 5   rosuvastatin  (CRESTOR ) 20 MG tablet, TAKE 1 TABLET BY MOUTH DAILY, Disp: 90 tablet, Rfl: 0   senna-docusate (SENOKOT-S) 8.6-50 MG  tablet, Take 1 tablet by mouth at bedtime as needed for mild constipation., Disp: 10 tablet, Rfl: 0   tirzepatide  (MOUNJARO ) 15 MG/0.5ML Pen, Inject 15 mg into the skin once a week., Disp: 6 mL, Rfl: 3   Vibegron  (GEMTESA ) 75 MG TABS, Take 1 tablet (75 mg total) by mouth daily., Disp: 30 tablet, Rfl: 11   fluconazole  (DIFLUCAN ) 150 MG tablet, Take  1 tablet by mouth x 1 dose. May repeat in 72 hours if needed. (Patient not taking: Reported on 12/07/2023), Disp: 2 tablet, Rfl: 1  Physical exam:  Vitals:   12/10/23 1418  BP: 121/76  Pulse: 80  Resp: 20  Temp: 98.3 F (36.8 C)  TempSrc: Tympanic  SpO2: 97%  Weight: 223 lb (101.2 kg)  Height: 5' 5 (1.651 m)   Physical Exam Constitutional:      Comments: Sitting in a wheelchair. Appears in no acute distress  Cardiovascular:     Rate and Rhythm: Normal rate and regular rhythm.     Heart sounds: Normal heart sounds.  Pulmonary:     Effort: Pulmonary effort is normal.     Breath sounds: Normal breath sounds.  Skin:    General: Skin is warm and dry.  Neurological:     Mental Status: She is alert and oriented to person, place, and time.      I have personally reviewed labs listed below:    Latest Ref Rng & Units 07/21/2023    3:22 PM  CMP  Glucose 70 - 99 mg/dL 92   BUN 6 - 23 mg/dL 35   Creatinine 9.59 - 1.20 mg/dL 8.36   Sodium 864 - 854 mEq/L 139   Potassium 3.5 - 5.1 mEq/L 4.6   Chloride 96 - 112 mEq/L 103   CO2 19 - 32 mEq/L 25   Calcium  8.4 - 10.5 mg/dL 9.4   Total Protein 6.0 - 8.3 g/dL 6.8   Total Bilirubin 0.2 - 1.2 mg/dL 0.6   Alkaline Phos 39 - 117 U/L 99   AST 0 - 37 U/L 30   ALT 0 - 35 U/L 25       Latest Ref Rng & Units 11/30/2023   11:29 AM  CBC  WBC 4.0 - 10.5 K/uL 6.3   Hemoglobin 12.0 - 15.0 g/dL 86.0   Hematocrit 63.9 - 46.0 % 43.5   Platelets 150 - 400 K/uL 186    I have personally reviewed Radiology images listed below: No images are attached to the encounter.  DG Hand Complete Right Result  Date: 12/06/2023 CLINICAL DATA:  Right hand pain after fall. EXAM: RIGHT HAND - COMPLETE 3+ VIEW COMPARISON:  None Available. FINDINGS: The second and fifth digits are held in flexion on all views at the distal interphalangeal joints with joint space narrowing, spurring, and slight volar subluxation. Suspect this is chronic, however no prior exams available for comparison. There is no evidence of acute fracture. Additional joint space narrowing and spurring at the thumb carpal metacarpal and metacarpal phalangeal joint no focal soft tissue abnormality. IMPRESSION: 1. No acute fracture of the right hand. 2. Flexion of the second and fifth digits at the distal interphalangeal joints with joint space narrowing, spurring, and slight volar subluxation. Suspect this is chronic arthropathy, however no prior exams available for comparison. Recommend correlation with physical exam. Electronically Signed   By: Andrea Gasman M.D.   On: 12/06/2023 23:47     Assessment and plan- Patient is a 73 y.o. adult here for routine f/u of iron  deficiency anemia   Patient is not presently anemic with an H&H of 13.9/43.9.  Ferritin levels are presently normal at 129 with normal iron  studies.  She does not require any IV iron  at this time.  CBC ferritin and iron  studies in 6 months in 1 year and I will see her back in 1 year   Visit Diagnosis 1. Iron  deficiency anemia,  unspecified iron  deficiency anemia type      Dr. Annah Skene, MD, MPH [facility] [phone] 12/10/2023 2:20 PM

## 2023-12-11 ENCOUNTER — Encounter: Payer: Self-pay | Admitting: Oncology

## 2023-12-14 ENCOUNTER — Telehealth: Payer: Self-pay

## 2023-12-14 NOTE — Telephone Encounter (Signed)
 Copied from CRM #[id number]. Topic: General - Other >> Dec 14, 2023  8:34 AM Aleatha C wrote: Reason for CRM: Patient has tested positive for covid and has a cold for about 3 weeks and would like a Paxlovid please call and let her know if she can get that called in

## 2023-12-22 ENCOUNTER — Ambulatory Visit: Payer: Self-pay | Admitting: Podiatry

## 2023-12-22 DIAGNOSIS — Z91199 Patient's noncompliance with other medical treatment and regimen due to unspecified reason: Secondary | ICD-10-CM

## 2023-12-22 NOTE — Progress Notes (Signed)
 Cancel 24 hours-covid

## 2023-12-24 ENCOUNTER — Other Ambulatory Visit: Payer: Self-pay | Admitting: Nurse Practitioner

## 2023-12-24 DIAGNOSIS — E1165 Type 2 diabetes mellitus with hyperglycemia: Secondary | ICD-10-CM

## 2023-12-29 ENCOUNTER — Ambulatory Visit: Admitting: Obstetrics and Gynecology

## 2023-12-29 ENCOUNTER — Encounter: Payer: Self-pay | Admitting: Obstetrics and Gynecology

## 2023-12-29 VITALS — BP 132/85 | HR 69

## 2023-12-29 DIAGNOSIS — Z87440 Personal history of urinary (tract) infections: Secondary | ICD-10-CM | POA: Diagnosis not present

## 2023-12-29 DIAGNOSIS — N390 Urinary tract infection, site not specified: Secondary | ICD-10-CM

## 2023-12-29 DIAGNOSIS — N3281 Overactive bladder: Secondary | ICD-10-CM | POA: Diagnosis not present

## 2023-12-29 MED ORDER — GEMTESA 75 MG PO TABS: 1.0000 | ORAL_TABLET | Freq: Every day | ORAL | 11 refills | Status: AC

## 2023-12-29 MED ORDER — METHENAMINE HIPPURATE 1 G PO TABS: 1.0000 g | ORAL_TABLET | Freq: Two times a day (BID) | ORAL | 11 refills | Status: AC

## 2023-12-29 NOTE — Patient Instructions (Signed)
 Stop cephalexin  and start methenamine  1g twice a day for prevention of urinary tract infections Use estradiol  cream 3 times a week Can use vitamin E cream on the vulva/ vagina as needed for dryness

## 2023-12-29 NOTE — Progress Notes (Signed)
 [facility] Return Visit  SUBJECTIVE  History of Present Illness: [name] is a 73 y.o. female seen in follow-up for recurrent UTI, vaginal atrophy, and OAB. Plan at last visit was continue Gemtesa  74mg  daily, use estrogen cream and  low dose keflex  for UTI prevention.   The gemtesa  has been really helping to control her urgency. Sometimes if she waits too long then she cannot make it to the bathroom. Usually can go 3-4 hours.   Still using vaginal estrogen cream three times a week. She is using it inside the vagina and the vulva area. She is running out of the cream because she is using it on the vulva as well.   Past Medical History: Patient  has a past medical history of Anemia, Anxiety, Arthritis, Asthma, Atypical chest pain, Carotid arterial disease, Chronic heart failure with preserved ejection fraction (HFpEF) (HCC), Chronic, continuous use of opioids, CKD (chronic kidney disease), stage III (HCC), Complication of anesthesia, Cystocele, Deep vein thrombosis (DVT) of right lower extremity (HCC), Depression, Diaphragm paralysis (2018), DM (diabetes mellitus) type II, controlled, with peripheral vascular disorder (HCC) (08/31/2014), Dyspnea, Edema, Fatty liver, GERD (gastroesophageal reflux disease), Gout, Heart murmur, Hemiparesis affecting left side as late effect of cerebrovascular accident (HCC), Hiatal hernia, History of IBS, History of methicillin resistant staphylococcus aureus (MRSA) (2008), History of multiple strokes, Hyperlipidemia, Hypertension, Hypothyroidism, Insomnia, Long term current use of anticoagulant, Mitral regurgitation, Nephrolithiasis, Neuropathy involving both lower extremities, Non-healing ulcer of left foot (HCC), Obesity, Class II, BMI 35-39.9, with comorbidity, OSA on CPAP, PAF (paroxysmal atrial fibrillation) (HCC), PAH (pulmonary artery hypertension) (HCC), Peripheral vascular disease, T2DM (type 2 diabetes mellitus) (HCC), Tricuspid regurgitation,  Umbilical hernia, Urinary incontinence, and Venous stasis dermatitis of both lower extremities.   Past Surgical History: She  has a past surgical history that includes Ankle surgery (Right); Vaginal hysterectomy; Sleeve Gastroplasty; Lithotripsy; Kidney stone surgery (Right); transthoracic echocardiogram (08/03/2013); transesophageal echocardiogram (08/08/2013); Tonsillectomy; Total knee arthroplasty (Left); Hiatal hernia repair; Cholecystectomy; I & D extremity (Left, 06/27/2015); Application if wound vac (Left, 06/27/2015); removal of left hematoma (Left); NM GATED MYOVIEW  ([facility] HX) (04/2015); Reverse shoulder arthroplasty (Right, 10/08/2015); Umbilical hernia repair; Ulnar nerve transposition (Right, 08/06/2016); arm surgery; Irrigation and debridement abscess (Left, 09/07/2016); Incision and drainage of wound (Left, 09/25/2016); Application if wound vac (Left, 09/25/2016); Colonoscopy; Upper gi endoscopy; Esophagogastroduodenoscopy (egd) with propofol  (N/A, 01/11/2017); Colonoscopy with propofol  (N/A, 01/11/2017); Colonoscopy with propofol  (N/A, 02/22/2017); OTHER SURGICAL HISTORY; Total shoulder replacement (Right); Total knee arthroplasty (2013); Colonoscopy with propofol  (N/A, 05/31/2017); Lower Extremity Angiography (Left, 04/04/2018); Esophagogastroduodenoscopy (egd) with propofol  (N/A, 10/25/2018); Incision and drainage of wound (Left, 11/09/2018); Esophagogastroduodenoscopy (egd) with propofol  (N/A, 11/29/2019); Esophagogastroduodenoscopy (egd) with propofol  (N/A, 01/26/2022); Colonoscopy with propofol  (N/A, 01/26/2022); and Reverse shoulder arthroplasty (Left, 09/01/2022).   Medications: She has a current medication list which includes the following prescription(s): acetaminophen , albuterol , alprazolam , ammonium lactate , bupropion , calcium -vitamin d , clobetasol  cream, colchicine , freestyle libre 3 plus sensor, cyanocobalamin , diclofenac  sodium, duloxetine , eliquis , empagliflozin , ergocalciferol ,  estradiol , ferrous sulfate , fluconazole , trelegy ellipta , folic acid , furosemide , onetouch ultra, ipratropium-albuterol , onetouch ultrasoft, levocetirizine, levothyroxine , loperamide -simethicone , losartan , cvs triple magnesium  complex, methenamine , tub transfer board, montelukast , hair skin & nails, nystatin , ondansetron , pantoprazole , pregabalin , rosuvastatin , senna-docusate, mounjaro , and gemtesa .   Allergies: Patient is allergic to morphine  and codeine, penicillins, ambien  [zolpidem ], aspirin , oxytetracycline, and morphine .   Social History: Patient  reports that she has never smoked. She has never used smokeless tobacco. She reports that she does not drink alcohol  and does not use  drugs.      OBJECTIVE     Physical Exam: Vitals:   12/29/23 1431  BP: 132/85  Pulse: 69    Gen: No apparent distress, A&O x 3.  Detailed Urogynecologic Evaluation:  Deferred.    ASSESSMENT AND PLAN    [name] is a 73 y.o. with:  1. Recurrent UTI   2. Overactive bladder     - Has been on cephalexin  for over a year. We discussed stopping since she has not had any UTIs, to prevent antibiotic resistance. Will start methenamine  1g BID for prophylaxis.  - Continue on Gemtesa  75mg  daily. Refills sent to pharmacy.  - Continue estradiol  cream 2-3 times a week. Can also use coconut oil or vitamin E cream to help with vulvar dryness.   Patient to follow up 1 year or sooner if needed.   Rosaline LOISE Caper, MD

## 2024-01-04 ENCOUNTER — Other Ambulatory Visit: Payer: Self-pay | Admitting: Nurse Practitioner

## 2024-01-04 DIAGNOSIS — N952 Postmenopausal atrophic vaginitis: Secondary | ICD-10-CM

## 2024-01-05 ENCOUNTER — Ambulatory Visit: Admitting: Podiatry

## 2024-01-05 ENCOUNTER — Telehealth: Payer: Self-pay

## 2024-01-05 NOTE — Telephone Encounter (Signed)
 E fax received from pharmacy stating a new script for the estradiol  vaginal cream is needed due to pt taking more than prescribed and running out quicker. Pharmacy paper has been placed in provider to be signed folder for review

## 2024-01-12 ENCOUNTER — Ambulatory Visit: Admitting: Podiatry

## 2024-01-17 ENCOUNTER — Other Ambulatory Visit: Payer: Self-pay | Admitting: Nurse Practitioner

## 2024-01-17 DIAGNOSIS — E1142 Type 2 diabetes mellitus with diabetic polyneuropathy: Secondary | ICD-10-CM

## 2024-01-19 ENCOUNTER — Telehealth: Payer: Self-pay

## 2024-01-19 NOTE — Telephone Encounter (Signed)
 Copied from CRM [id number]. Topic: Clinical - Medication Question >> Jan 19, 2024  4:03 PM Mesmerise C wrote: Reason for CRM: Patient had to reschedule appointment for 10/30 next available date was 01/6 added to wait list due to her husband not doing well and usually takes her she wants to make sure her medications still gets refilled until her appt

## 2024-01-20 ENCOUNTER — Ambulatory Visit: Admitting: Nurse Practitioner

## 2024-01-20 ENCOUNTER — Other Ambulatory Visit: Payer: Self-pay

## 2024-01-20 DIAGNOSIS — F32A Depression, unspecified: Secondary | ICD-10-CM

## 2024-01-20 DIAGNOSIS — K219 Gastro-esophageal reflux disease without esophagitis: Secondary | ICD-10-CM

## 2024-01-20 DIAGNOSIS — J309 Allergic rhinitis, unspecified: Secondary | ICD-10-CM

## 2024-01-20 DIAGNOSIS — F418 Other specified anxiety disorders: Secondary | ICD-10-CM

## 2024-01-20 DIAGNOSIS — M109 Gout, unspecified: Secondary | ICD-10-CM

## 2024-01-20 MED ORDER — BUPROPION HCL ER (XL) 150 MG PO TB24: 150.0000 mg | ORAL_TABLET | Freq: Every day | ORAL | 3 refills | Status: AC

## 2024-01-20 MED ORDER — COLCHICINE 0.6 MG PO TABS: 0.6000 mg | ORAL_TABLET | Freq: Every day | ORAL | 0 refills | Status: AC

## 2024-01-20 MED ORDER — LEVOCETIRIZINE DIHYDROCHLORIDE 5 MG PO TABS: ORAL_TABLET | ORAL | 3 refills | Status: AC

## 2024-01-20 MED ORDER — PANTOPRAZOLE SODIUM 40 MG PO TBEC: 40.0000 mg | DELAYED_RELEASE_TABLET | Freq: Every day | ORAL | 3 refills | Status: AC

## 2024-01-20 MED ORDER — DULOXETINE HCL 60 MG PO CPEP: 60.0000 mg | ORAL_CAPSULE | Freq: Every day | ORAL | 3 refills | Status: AC

## 2024-01-20 NOTE — Telephone Encounter (Signed)
 Refills sent

## 2024-01-24 ENCOUNTER — Ambulatory Visit (INDEPENDENT_AMBULATORY_CARE_PROVIDER_SITE_OTHER): Admitting: Podiatry

## 2024-01-24 DIAGNOSIS — Z91199 Patient's noncompliance with other medical treatment and regimen due to unspecified reason: Secondary | ICD-10-CM

## 2024-01-24 NOTE — Progress Notes (Signed)
 Cancel 24 hours- sick

## 2024-02-08 NOTE — Telephone Encounter (Signed)
 open in error

## 2024-02-14 ENCOUNTER — Ambulatory Visit (INDEPENDENT_AMBULATORY_CARE_PROVIDER_SITE_OTHER): Payer: Self-pay | Admitting: Podiatry

## 2024-02-14 DIAGNOSIS — Z91199 Patient's noncompliance with other medical treatment and regimen due to unspecified reason: Secondary | ICD-10-CM

## 2024-02-14 NOTE — Progress Notes (Signed)
 No show

## 2024-02-18 ENCOUNTER — Other Ambulatory Visit: Payer: Self-pay | Admitting: Nurse Practitioner

## 2024-02-18 DIAGNOSIS — E1165 Type 2 diabetes mellitus with hyperglycemia: Secondary | ICD-10-CM

## 2024-02-25 ENCOUNTER — Other Ambulatory Visit: Payer: Self-pay | Admitting: Cardiovascular Disease

## 2024-02-25 DIAGNOSIS — Z7901 Long term (current) use of anticoagulants: Secondary | ICD-10-CM

## 2024-02-25 DIAGNOSIS — I5033 Acute on chronic diastolic (congestive) heart failure: Secondary | ICD-10-CM

## 2024-02-25 DIAGNOSIS — I480 Paroxysmal atrial fibrillation: Secondary | ICD-10-CM

## 2024-02-25 NOTE — Telephone Encounter (Signed)
 Eliquis  5mg  refill request received. Patient is 73 years old, weight-101.2kg, Crea- 1.63 on 07/21/23, Diagnosis-Afib, and last seen by Cadence Furth on 12/07/23. Dose is appropriate based on dosing criteria. Will send in refill to requested pharmacy.

## 2024-02-28 ENCOUNTER — Ambulatory Visit: Payer: Medicare HMO

## 2024-02-28 VITALS — Ht 64.0 in | Wt 208.0 lb

## 2024-02-28 DIAGNOSIS — Z0000 Encounter for general adult medical examination without abnormal findings: Secondary | ICD-10-CM

## 2024-02-28 DIAGNOSIS — E039 Hypothyroidism, unspecified: Secondary | ICD-10-CM

## 2024-02-28 NOTE — Progress Notes (Signed)
 Chief Complaint  Patient presents with   Medicare Wellness     Subjective:   [name] is a 73 y.o. adult who presents for a Medicare Annual Wellness Visit.  Visit info / Clinical Intake: Medicare Wellness Visit Type:: Subsequent Annual Wellness Visit Persons participating in visit and providing information:: patient Medicare Wellness Visit Mode:: Telephone If telephone:: video declined Since this visit was completed virtually, some vitals may be partially provided or unavailable. Missing vitals are due to the limitations of the virtual format.: Unable to obtain vitals - no equipment If Telephone or Video please confirm:: I connected with patient using audio/video enable telemedicine. I verified patient identity with two identifiers, discussed telehealth limitations, and patient agreed to proceed. Patient Location:: Home Provider Location:: Office/Home Interpreter Needed?: No Pre-visit prep was completed: yes AWV questionnaire completed by patient prior to visit?: no Living arrangements:: lives with spouse/significant other Patient's Overall Health Status Rating: good Typical amount of pain: some Does pain affect daily life?: (!) yes (because of neuropathy for over 10 years) Are you currently prescribed opioids?: no  Dietary Habits and Nutritional Risks How many meals a day?: 2 Eats fruit and vegetables daily?: (!) no Most meals are obtained by: preparing own meals In the last 2 weeks, have you had any of the following?: none Diabetic:: (!) yes Any non-healing wounds?: no How often do you check your BS?: continuous glucose monitor Would you like to be referred to a Nutritionist or for Diabetic Management? : no  Functional Status Activities of Daily Living (to include ambulation/medication): Independent Ambulation: Independent with device- listed below Home Assistive Devices/Equipment: Cane Medication Administration: Independent Home Management (perform basic  housework or laundry): Needs assistance (comment) Manage your own finances?: yes Primary transportation is: driving Concerns about vision?: no *vision screening is required for WTM* Concerns about hearing?: no  Fall Screening Falls in the past year?: 1 Number of falls in past year: 1 Was there an injury with Fall?: 0 Fall Risk Category Calculator: 2 Patient Fall Risk Level: Moderate Fall Risk  Fall Risk Patient at Risk for Falls Due to: Impaired balance/gait; History of fall(s) Fall risk Follow up: Falls evaluation completed; Falls prevention discussed  Home and Transportation Safety: All rugs have non-skid backing?: yes All stairs or steps have railings?: yes Grab bars in the bathtub or shower?: yes Have non-skid surface in bathtub or shower?: yes Good home lighting?: yes Regular seat belt use?: yes Hospital stays in the last year:: no  Cognitive Assessment Difficulty concentrating, remembering, or making decisions? : yes Will 6CIT or Mini Cog be Completed: yes What year is it?: 0 points What month is it?: 0 points Give patient an address phrase to remember (5 components): [address] [place] About what time is it?: 0 points Count backwards from 20 to 1: 0 points Say the months of the year in reverse: 0 points Repeat the address phrase from earlier: 0 points 6 CIT Score: 0 points  Advance Directives (For Healthcare) Does Patient Have a Medical Advance Directive?: Yes Does patient want to make changes to medical advance directive?: No - Patient declined Type of Advance Directive: Healthcare Power of [place]; Living will Copy of Healthcare Power of Attorney in Chart?: No - copy requested Copy of Living Will in Chart?: No - copy requested  Reviewed/Updated  Reviewed/Updated: Reviewed All (Medical, Surgical, Family, Medications, Allergies, Care Teams, Patient Goals)    Allergies (verified) Morphine  and codeine, Penicillins, Ambien  [zolpidem ], Aspirin ,  Oxytetracycline, and Morphine   Current Medications (verified) Outpatient Encounter Medications as of 02/28/2024  Medication Sig   acetaminophen  (TYLENOL ) 500 MG tablet Take 2 tablets (1,000 mg total) by mouth every 8 (eight) hours.   albuterol  (VENTOLIN  HFA) 108 (90 Base) MCG/ACT inhaler Inhale 2 puffs into the lungs every 6 (six) hours as needed.   ALPRAZolam  (XANAX ) 0.25 MG tablet Take 1 tablet (0.25 mg total) by mouth 2 (two) times daily as needed for anxiety. See SIG change was taking 1/2 of 0.5 tablet bid prn changed to 0.25 bid prn. D/c 0.5 xanax    ammonium lactate  (AMLACTIN DAILY) 12 % lotion Apply 1 Application topically as needed.   buPROPion  (WELLBUTRIN  XL) 150 MG 24 hr tablet Take 1 tablet (150 mg total) by mouth daily.   calcium -vitamin D  (OSCAL WITH D) 500-200 MG-UNIT tablet Take 1 tablet by mouth 2 (two) times daily with a meal.   clobetasol  cream (TEMOVATE ) 0.05 % Apply 1 application topically 2 (two) times daily as needed (skin irritation).    colchicine  0.6 MG tablet Take 1 tablet (0.6 mg total) by mouth daily. Repeat I 1 hour if needed. No more than 3 tablets in 24 hours PRN for GOUT FLARE   Continuous Glucose Receiver (FREESTYLE LIBRE 2 READER) DEVI by Does not apply route.   Continuous Glucose Sensor (FREESTYLE LIBRE 3 PLUS SENSOR) MISC PLACE ONE SENSOR ON THE SKIN EVERY FOURTEEN DAYS.USE TO CHECK GLUCOSE CONTINUOUSLY   CRANBERRY PO Take by mouth daily.   cyanocobalamin  (VITAMIN B12) 1000 MCG tablet Take 1,000 mcg by mouth daily.   diclofenac  Sodium (VOLTAREN ) 1 % GEL Apply 2 g topically 3 (three) times daily.   DULoxetine  (CYMBALTA ) 60 MG capsule Take 1 capsule (60 mg total) by mouth daily.   ELIQUIS  5 MG TABS tablet TAKE 1 TABLET BY MOUTH TWICE DAILY   empagliflozin  (JARDIANCE ) 10 MG TABS tablet Take 1 tablet (10 mg total) by mouth daily before breakfast.   ergocalciferol  (VITAMIN D2) 1.25 MG (50000 UT) capsule Take 50,000 Units by mouth once a week.   estradiol  (ESTRACE )  0.01 % CREA vaginal cream APPLY 0.5 MG (PEA-SIZED AMOUNT) JUST INSIDE THE VAGINAL INTROITUS WITH A FINGER-TIP DAILY AT BEDTIME   ferrous sulfate  324 MG TBEC Take 324 mg by mouth daily with breakfast.   fluconazole  (DIFLUCAN ) 150 MG tablet Take 1 tablet by mouth x 1 dose. May repeat in 72 hours if needed.   Fluticasone -Umeclidin-Vilant (TRELEGY ELLIPTA ) 200-62.5-25 MCG/ACT AEPB INHALE ONE PUFF INTO THE LUNGS EVERY DAY   folic acid  (FOLVITE ) 800 MCG tablet Take 800 mcg by mouth daily.   furosemide  (LASIX ) 40 MG tablet Take 1 tablet (40 mg total) by mouth as directed. Take 1 tablet daily. May take additional 1 tablet If weight gain of greater than 2 pounds in 24 hours or 5 pounds in one week.   glucose blood (ONETOUCH ULTRA) test strip TEST TWICE DAILY AS DIRECTED   ipratropium-albuterol  (DUONEB) 0.5-2.5 (3) MG/3ML SOLN Take 3 mLs by nebulization every 6 (six) hours as needed.   Lancets (ONETOUCH ULTRASOFT) lancets Use as instructed   levocetirizine (XYZAL ) 5 MG tablet TAKE ONE TABLET BY MOUTH AT BEDTIME AS NEEDED FOR ALLERGIES   levothyroxine  (SYNTHROID ) 137 MCG tablet TAKE ONE (1) TABLET BY MOUTH DAILY 30 MINUTES BEFORE BREAKFAST; DO NOT MIX WITH VITAMINS OR ACID REFLUX MEDICATIONS**STOP DOSE   Loperamide -Simethicone  2-125 MG TABS Take 1 tablet by mouth daily as needed. May repeat in 1 hour   losartan  (COZAAR ) 25 MG tablet Take 1  tablet (25 mg total) by mouth daily.   Magnesium  ([employer] TRIPLE MAGNESIUM  COMPLEX) 400 MG CAPS Take 1 capsule by mouth daily.   methenamine  (HIPREX ) 1 g tablet Take 1 tablet (1 g total) by mouth 2 (two) times daily with a meal.   Misc. Devices (TUB TRANSFER BOARD) MISC 1 Device by Does not apply route as needed.   MOUNJARO  15 MG/0.5ML Pen INJECT THE CONTENTS OF ONE PEN UNDER THE SKIN WEEKLY ON THE SAME DAY EACH WEEK   Multiple Vitamins-Minerals (HAIR SKIN & NAILS) TABS Take 1 tablet by mouth daily.   nystatin  powder Apply 1 Application topically 3 (three) times daily as  needed.   ondansetron  (ZOFRAN ) 4 MG tablet Take 1 tablet (4 mg total) by mouth every 8 (eight) hours as needed for nausea or vomiting.   pantoprazole  (PROTONIX ) 40 MG tablet Take 1 tablet (40 mg total) by mouth daily. 30 min before food   pregabalin  (LYRICA ) 75 MG capsule TAKE ONE CAPSULE BY MOUTH THREE TIMES DAILY   rosuvastatin  (CRESTOR ) 20 MG tablet TAKE 1 TABLET BY MOUTH DAILY   senna-docusate (SENOKOT-S) 8.6-50 MG tablet Take 1 tablet by mouth at bedtime as needed for mild constipation.   Vibegron  (GEMTESA ) 75 MG TABS Take 1 tablet (75 mg total) by mouth daily.   montelukast  (SINGULAIR ) 10 MG tablet TAKE 1 TABLET BY MOUTH NIGHTLY (Patient not taking: Reported on 02/28/2024)   No facility-administered encounter medications on file as of 02/28/2024.   History: Past Medical History:  Diagnosis Date   Anemia    Anxiety    a.) on BZO (alprazolam ) PRN   Arthritis    Asthma    Atypical chest pain    a. 03/2018 MV: No ischemia/infarct. EF >65%.   Carotid arterial disease    a. 03/2020 U/S: RICA <50, LICA nl.   Chronic heart failure with preserved ejection fraction (HFpEF) (HCC)    a. 08/2013 Echo: EF 60-65%; b. 08/2019 Echo: EF 60-65%; c. 03/2020 Echo: EF 60-65%; d. 05/2021 Echo: EF 55-60%, no rmwa, nl RV fxn, RVSP 54.51mmHg. Mildly dil LA, mild MR, mod TR.   Chronic, continuous use of opioids    a.) has naloxone  Rx available   CKD (chronic kidney disease), stage III (HCC)    Complication of anesthesia    a.) RIGHT hemidiaphragm paralysis s/p interscalene block for shoulder arthroplasty in 2017 --> caused permanent lung damage per patient --> REFUSES further neuraxial anesthesia   Cystocele    Deep vein thrombosis (DVT) of right lower extremity (HCC)    Depression    Diaphragm paralysis 2018   a.) RIGHT hemidiaphragm paralysis s/p interscalene block for shoulder arthroplasty in 2017; refuses additional regional/neuraxial anesthesia   DM (diabetes mellitus) type II, controlled, with  peripheral vascular disorder (HCC) 08/31/2014   Dyspnea    11/08/2018 - more now due to lack of exercise   Edema    Fatty liver    GERD (gastroesophageal reflux disease)    Gout    Heart murmur    Hemiparesis affecting left side as late effect of cerebrovascular accident (HCC)    Hiatal hernia    a.) s/p repair   History of IBS    History of methicillin resistant staphylococcus aureus (MRSA) 2008   History of multiple strokes    Hyperlipidemia    Hypertension    Hypothyroidism    Insomnia    Long term current use of anticoagulant    a.) apixaban    Mitral regurgitation  a. 05/2021 Echo: Mild MR.   Nephrolithiasis    Neuropathy involving both lower extremities    Non-healing ulcer of left foot (HCC)    Obesity, Class II, BMI 35-39.9, with comorbidity    OSA on CPAP    PAF (paroxysmal atrial fibrillation) (HCC)    a.) 07/2013 Event monitor: Freq PVCs and 3 short runs of Afib; b.) CHA2DS2VASc = 8 (age, sex, CHF, HTN, CVA/DVT x2, vascular disease history, T2DM);  b.) rate/rhythm maintained without pharmacological intervention; chronically anticoagulated with apixaban    PAH (pulmonary artery hypertension) (HCC)    a. 05/2021 Echo: RVSP 54.11mmHg.   Peripheral vascular disease    a. 03/2018 LE Angio: Nl renal arteries. Nl aorta & iliacs. Nl LLE arterial flow; b. 06/2020 ABI's: Nl bilat.   T2DM (type 2 diabetes mellitus) (HCC)    Tricuspid regurgitation    a. 05/2021 Echo: Mod TR (in setting of RVSP 54.77mmHg).   Umbilical hernia    a.) s/p repair   Urinary incontinence    Venous stasis dermatitis of both lower extremities    Past Surgical History:  Procedure Laterality Date   ANKLE SURGERY Right    APPLICATION OF WOUND VAC Left 06/27/2015   Procedure: APPLICATION OF WOUND VAC ( POSSIBLE ) ;  Surgeon: Selinda GORMAN Gu, MD;  Location: [facility] ORS;  Service: Vascular;  Laterality: Left;   APPLICATION OF WOUND VAC Left 09/25/2016   Procedure: APPLICATION OF WOUND VAC;  Surgeon: Lilli Cough, DPM;  Location: [facility] ORS;  Service: Podiatry;  Laterality: Left;   arm surgery     right     CHOLECYSTECTOMY     COLONOSCOPY     COLONOSCOPY WITH PROPOFOL  N/A 01/11/2017   Procedure: COLONOSCOPY WITH PROPOFOL ;  Surgeon: Gaylyn Gladis PENNER, MD;  Location[facility];  Service: Endoscopy;  Laterality: N/A;   COLONOSCOPY WITH PROPOFOL  N/A 02/22/2017   Procedure: COLONOSCOPY WITH PROPOFOL ;  Surgeon: Gaylyn Gladis PENNER, MD;  Location: [facility] ENDOSCOPY;  Service: Endoscopy;  Laterality: N/A;   COLONOSCOPY WITH PROPOFOL  N/A 05/31/2017   Procedure: COLONOSCOPY WITH PROPOFOL ;  Surgeon: Gaylyn Gladis PENNER, MD;  Location: [facility] ENDOSCOPY;  Service: Endoscopy;  Laterality: N/A;   COLONOSCOPY WITH PROPOFOL  N/A 01/26/2022   Procedure: COLONOSCOPY WITH PROPOFOL ;  Surgeon: Maryruth Ole DASEN, MD;  Location: [facility] ENDOSCOPY;  Service: Endoscopy;  Laterality: N/A;   ESOPHAGOGASTRODUODENOSCOPY (EGD) WITH PROPOFOL  N/A 01/11/2017   Procedure: ESOPHAGOGASTRODUODENOSCOPY (EGD) WITH PROPOFOL ;  Surgeon: Gaylyn Gladis PENNER, MD;  Location: [facility] ENDOSCOPY;  Service: Endoscopy;  Laterality: N/A;   ESOPHAGOGASTRODUODENOSCOPY (EGD) WITH PROPOFOL  N/A 10/25/2018   Procedure: ESOPHAGOGASTRODUODENOSCOPY (EGD) WITH PROPOFOL ;  Surgeon: Gaylyn Gladis PENNER, MD;  Location: [facility] ENDOSCOPY;  Service: Endoscopy;  Laterality: N/A;   ESOPHAGOGASTRODUODENOSCOPY (EGD) WITH PROPOFOL  N/A 11/29/2019   Procedure: ESOPHAGOGASTRODUODENOSCOPY (EGD) WITH PROPOFOL ;  Surgeon: [place], Ladell POUR, MD;  Location: [facility] ENDOSCOPY;  Service: Gastroenterology;  Laterality: N/A;   ESOPHAGOGASTRODUODENOSCOPY (EGD) WITH PROPOFOL  N/A 01/26/2022   Procedure: ESOPHAGOGASTRODUODENOSCOPY (EGD) WITH PROPOFOL ;  Surgeon: Maryruth Ole DASEN, MD;  Location: [facility] ENDOSCOPY;  Service: Endoscopy;  Laterality: N/A;  DM   HIATAL HERNIA REPAIR     I & D EXTREMITY Left 06/27/2015   Procedure: IRRIGATION AND DEBRIDEMENT EXTREMITY            ( CALF HEMATOMA ) POSSIBLE  WOUND VAC;  Surgeon: Selinda GORMAN Gu, MD;  Location: [facility] ORS;  Service: Vascular;  Laterality: Left;   INCISION AND DRAINAGE OF WOUND Left 09/25/2016   Procedure: IRRIGATION AND DEBRIDEMENT -  PARTIAL RESECTION OF ACHILLES TENDON WITH WOUND VAC APPLICATION;  Surgeon: Lilli Cough, DPM;  Location: [facility] OR[facility];  Service: Podiatry;  Laterality: Left;   INCISION AND DRAINAGE OF WOUND Left 11/09/2018   Procedure: Excision of left foot wound with ACell placement;  Surgeon: Lowery Estefana RAMAN, DO;  Location: [facility] OR;  Service: Plastics;  Laterality: Left;   IRRIGATION AND DEBRIDEMENT ABSCESS Left 09/07/2016   Procedure: IRRIGATION AND DEBRIDEMENT ABSCESS LEFT HEEL;  Surgeon: Lilli Cough, DPM;  Location: [facility] ORS;  Service: Podiatry;  Laterality: Left;   KIDNEY STONE SURGERY Right    LITHOTRIPSY     LOWER EXTREMITY ANGIOGRAPHY Left 04/04/2018   Procedure: LOWER EXTREMITY ANGIOGRAPHY;  Surgeon: Marea Selinda RAMAN, MD;  Location: [facility] INVASIVE CV LAB;  Service: Cardiovascular;  Laterality: Left;   NM GATED MYOVIEW  ([facility] HX)  04/2015   Likely breast attenuation. LOW RISK study. Normal EF 55-60%.   OTHER SURGICAL HISTORY     08/2016 or 09/2016 surgery on achilles tenso h/o staph infection heal. Dr. Lilli   removal of left hematoma Left    leg   REVERSE SHOULDER ARTHROPLASTY Right 10/08/2015   Procedure: REVERSE SHOULDER ARTHROPLASTY;  Surgeon: Norleen JINNY Maltos, MD;  Location: [facility] ORS;  Service: Orthopedics;  Laterality: Right;   REVERSE SHOULDER ARTHROPLASTY Left 09/01/2022   Procedure: REVERSE SHOULDER ARTHROPLASTY;  Surgeon: Tobie Priest, MD;  Location: [facility] ORS;  Service: Orthopedics;  Laterality: Left;   SLEEVE GASTROPLASTY     TONSILLECTOMY     TOTAL KNEE ARTHROPLASTY Left    TOTAL KNEE ARTHROPLASTY  2013   TOTAL SHOULDER REPLACEMENT Right    TRANSESOPHAGEAL ECHOCARDIOGRAM  08/08/2013   Mild LVH, EF 60-65%. Moderate LA dilation and mild RA dilation. Mild MR with no evidence of stenosis and no evidence of  endocarditis. A false chordae is noted.   TRANSTHORACIC ECHOCARDIOGRAM  08/03/2013   Mild-Moderate concentric LVH, EF 60-65%. Normal diastolic function. Mild LA dilation. Mild MR with possible vegetation  - not confirmed on TEE    ULNAR NERVE TRANSPOSITION Right 08/06/2016   Procedure: ULNAR NERVE DECOMPRESSION/TRANSPOSITION;  Surgeon: Maltos Norleen JINNY, MD;  Location: [facility] ORS;  Service: Orthopedics;  Laterality: Right;   UMBILICAL HERNIA REPAIR     UPPER GI ENDOSCOPY     VAGINAL HYSTERECTOMY     Family History  Problem Relation Age of Onset   Skin cancer Father    Diabetes Father    Hypertension Father    Peripheral vascular disease Father    Cancer Father    Cerebral aneurysm Father    Alcohol  abuse Father    Varicose Veins Mother    Kidney disease Mother    Arthritis Mother    Mental illness Sister    Cancer Maternal Aunt        breast   Breast cancer Maternal Aunt    Arthritis Maternal Grandmother    Hypertension Maternal Grandmother    Diabetes Maternal Grandmother    Arthritis Maternal Grandfather    Heart disease Maternal Grandfather    Hypertension Maternal Grandfather    Arthritis Paternal Grandmother    Hypertension Paternal Grandmother    Diabetes Paternal Grandmother    Arthritis Paternal Grandfather    Hypertension Paternal Grandfather    Bladder Cancer Neg Hx    Kidney cancer Neg Hx    Social History   Occupational History   Not on file  Tobacco Use   Smoking status: Never   Smokeless tobacco: Never  Vaping Use   Vaping  status: Never Used  Substance and Sexual Activity   Alcohol  use: No    Alcohol /week: 0.0 standard drinks of alcohol    Drug use: Never   Sexual activity: Not Currently   Tobacco Counseling Counseling given: Not Answered  SDOH Screenings   Food Insecurity: No Food Insecurity (02/28/2024)  Housing: Low Risk  (02/28/2024)  Transportation Needs: No Transportation Needs (02/28/2024)  Utilities: Not At Risk (02/28/2024)  Alcohol  Screen:  Low Risk  (02/28/2024)  Depression (PHQ2-9): Low Risk  (02/28/2024)  Financial Resource Strain: Low Risk  (02/28/2024)  Physical Activity: Insufficiently Active (02/28/2024)  Social Connections: Moderately Integrated (02/28/2024)  Stress: No Stress Concern Present (02/28/2024)  Tobacco Use: Low Risk  (02/28/2024)  Health Literacy: Adequate Health Literacy (02/28/2024)   See flowsheets for full screening details  Depression Screen Depression Screening Exception Documentation Depression Screening Exception:: Patient refusal  PHQ 2 & 9 Depression Scale- Over the past 2 weeks, how often have you been bothered by any of the following problems? Little interest or pleasure in doing things: 0 Feeling down, depressed, or hopeless (PHQ Adolescent also includes...irritable): 0 PHQ-2 Total Score: 0 Trouble falling or staying asleep, or sleeping too much: 0 Feeling tired or having little energy: 1 Poor appetite or overeating (PHQ Adolescent also includes...weight loss): 0 Feeling bad about yourself - or that you are a failure or have let yourself or your family down: 0 Trouble concentrating on things, such as reading the newspaper or watching television (PHQ Adolescent also includes...like school work): 0 Moving or speaking so slowly that other people could have noticed. Or the opposite - being so fidgety or restless that you have been moving around a lot more than usual: 0 Thoughts that you would be better off dead, or of hurting yourself in some way: 0 PHQ-9 Total Score: 1 If you checked off any problems, how difficult have these problems made it for you to do your work, take care of things at home, or get along with other people?: Not difficult at all     Goals Addressed             This Visit's Progress    Patient Stated       Wants to walk without a walking stick             Objective:    Today's Vitals   02/28/24 1507  Weight: 208 lb (94.3 kg)  Height: 5' 4 (1.626 m)   Body  mass index is 35.7 kg/m.  Hearing/Vision screen Hearing Screening - Comments:: No issues Vision Screening - Comments:: Glasses, Nice Eye, up to date Immunizations and Health Maintenance Health Maintenance  Topic Date Due   Zoster Vaccines- Shingrix (1 of 2) Never done   FOOT EXAM  03/08/2019   Diabetic kidney evaluation - Urine ACR  08/22/2021   HEMOGLOBIN A1C  01/20/2024   OPHTHALMOLOGY EXAM  06/28/2024   Diabetic kidney evaluation - eGFR measurement  07/20/2024   Medicare Annual Wellness (AWV)  02/27/2025   Mammogram  03/03/2025   Colonoscopy  01/27/2027   DTaP/Tdap/Td (2 - Td or Tdap) 03/09/2028   Pneumococcal Vaccine: 50+ Years  Completed   Influenza Vaccine  Completed   Bone Density Scan  Completed   Hepatitis C Screening  Completed   Meningococcal B Vaccine  Aged Out   COVID-19 Vaccine  Discontinued        Assessment/Plan:  This is a routine wellness examination for [place].  Patient Care Team: Lester, Kacy, NP as PCP -  General (Nurse Practitioner) Darron [name] LABOR, MD as PCP - Cardiology (Cardiology) Tanya Glisson, MD as Consulting Physician (Pain Medicine) Anner Alm ORN, MD as Consulting Physician (Cardiology) Poggi, Norleen PARAS, MD as Consulting Physician (Surgery) Melanee Annah BROCKS, MD as Consulting Physician (Oncology) Tobie Priest, MD as Consulting Physician (Orthopedic Surgery)  I have personally reviewed and noted the following in the patient's chart:   Medical and social history Use of alcohol , tobacco or illicit drugs  Current medications and supplements including opioid prescriptions. Functional ability and status Nutritional status Physical activity Advanced directives List of other physicians Hospitalizations, surgeries, and ER visits in previous 12 months Vitals Screenings to include cognitive, depression, and falls Referrals and appointments  No orders of the defined types were placed in this encounter.  In addition, I have reviewed and  discussed with patient certain preventive protocols, quality metrics, and best practice recommendations. A written personalized care plan for preventive services as well as general preventive health recommendations were provided to patient.   Angeline Fredericks, LPN   [id number]   Return in 1 year (on 02/27/2025).  After Visit Summary: (Pick Up) Due to this being a telephonic visit, with patients personalized plan was offered to patient and patient has requested to Pick up at office.  Nurse Notes: Patient declines covid and shingles vaccines.  Patient only wants mammogram every 2 years. Patient needs a diabetic foot exam, A1C and Urine ACR at next office visit.

## 2024-02-28 NOTE — Patient Instructions (Addendum)
 [name],  Thank you for taking the time for your Medicare Wellness Visit. I appreciate your continued commitment to your health goals. Please review the care plan we discussed, and feel free to reach out if I can assist you further.  Please note that Annual Wellness Visits do not include a physical exam. Some assessments may be limited, especially if the visit was conducted virtually. If needed, we may recommend an in-person follow-up with your provider.  Ongoing Care Seeing your primary care provider every 3 to 6 months helps us  monitor your health and provide consistent, personalized care.  Consider updating your vaccines.  Referrals If a referral was made during today's visit and you haven't received any updates within two weeks, please contact the referred provider directly to check on the status.  Recommended Screenings:  Health Maintenance  Topic Date Due   Zoster (Shingles) Vaccine (1 of 2) Never done   Complete foot exam   03/08/2019   Yearly kidney health urinalysis for diabetes  08/22/2021   Hemoglobin A1C  01/20/2024   Eye exam for diabetics  06/28/2024   Yearly kidney function blood test for diabetes  07/20/2024   Medicare Annual Wellness Visit  02/27/2025   Breast Cancer Screening  03/03/2025   Colon Cancer Screening  01/27/2027   DTaP/Tdap/Td vaccine (2 - Td or Tdap) 03/09/2028   Pneumococcal Vaccine for age over 14  Completed   Flu Shot  Completed   Osteoporosis screening with Bone Density Scan  Completed   Hepatitis C Screening  Completed   Meningitis B Vaccine  Aged Out   COVID-19 Vaccine  Discontinued       02/28/2024    3:16 PM  Advanced Directives  Does Patient Have a Medical Advance Directive? Yes  Type of Estate Agent of [place];Living will  Does patient want to make changes to medical advance directive? No - Patient declined  Copy of Healthcare Power of Attorney in Chart? No - copy requested    Vision: Annual vision  screenings are recommended for early detection of glaucoma, cataracts, and diabetic retinopathy. These exams can also reveal signs of chronic conditions such as diabetes and high blood pressure.  Dental: Annual dental screenings help detect early signs of oral cancer, gum disease, and other conditions linked to overall health, including heart disease and diabetes.  Please see the attached documents for additional preventive care recommendations.

## 2024-03-06 ENCOUNTER — Other Ambulatory Visit: Payer: Self-pay | Admitting: Nurse Practitioner

## 2024-03-24 ENCOUNTER — Other Ambulatory Visit: Payer: Self-pay | Admitting: Cardiovascular Disease

## 2024-03-28 ENCOUNTER — Ambulatory Visit: Admitting: Nurse Practitioner

## 2024-03-28 ENCOUNTER — Encounter: Payer: Self-pay | Admitting: Oncology

## 2024-03-28 ENCOUNTER — Encounter: Payer: Self-pay | Admitting: Nurse Practitioner

## 2024-03-28 VITALS — BP 132/78 | HR 62 | Temp 98.4°F | Ht 64.0 in | Wt 207.4 lb

## 2024-03-28 DIAGNOSIS — F419 Anxiety disorder, unspecified: Secondary | ICD-10-CM

## 2024-03-28 DIAGNOSIS — Z7984 Long term (current) use of oral hypoglycemic drugs: Secondary | ICD-10-CM

## 2024-03-28 DIAGNOSIS — F32A Depression, unspecified: Secondary | ICD-10-CM | POA: Diagnosis not present

## 2024-03-28 DIAGNOSIS — N1832 Chronic kidney disease, stage 3b: Secondary | ICD-10-CM | POA: Diagnosis not present

## 2024-03-28 DIAGNOSIS — E1165 Type 2 diabetes mellitus with hyperglycemia: Secondary | ICD-10-CM

## 2024-03-28 DIAGNOSIS — I482 Chronic atrial fibrillation, unspecified: Secondary | ICD-10-CM

## 2024-03-28 DIAGNOSIS — I10 Essential (primary) hypertension: Secondary | ICD-10-CM

## 2024-03-28 DIAGNOSIS — Z7985 Long-term (current) use of injectable non-insulin antidiabetic drugs: Secondary | ICD-10-CM | POA: Diagnosis not present

## 2024-03-28 DIAGNOSIS — Z8739 Personal history of other diseases of the musculoskeletal system and connective tissue: Secondary | ICD-10-CM | POA: Diagnosis not present

## 2024-03-28 DIAGNOSIS — E039 Hypothyroidism, unspecified: Secondary | ICD-10-CM

## 2024-03-28 LAB — COMPREHENSIVE METABOLIC PANEL WITH GFR
ALT: 21 U/L (ref 3–35)
AST: 31 U/L (ref 5–37)
Albumin: 3.8 g/dL (ref 3.5–5.2)
Alkaline Phosphatase: 100 U/L (ref 39–117)
BUN: 18 mg/dL (ref 6–23)
CO2: 28 meq/L (ref 19–32)
Calcium: 8.7 mg/dL (ref 8.4–10.5)
Chloride: 104 meq/L (ref 96–112)
Creatinine, Ser: 0.91 mg/dL (ref 0.40–1.20)
GFR: 62.13 mL/min
Glucose, Bld: 89 mg/dL (ref 70–99)
Potassium: 4.1 meq/L (ref 3.5–5.1)
Sodium: 141 meq/L (ref 135–145)
Total Bilirubin: 0.6 mg/dL (ref 0.2–1.2)
Total Protein: 6 g/dL (ref 6.0–8.3)

## 2024-03-28 LAB — TSH: TSH: 0.53 u[IU]/mL (ref 0.35–5.50)

## 2024-03-28 LAB — HEMOGLOBIN A1C: Hgb A1c MFr Bld: 6.2 % (ref 4.6–6.5)

## 2024-03-28 LAB — URIC ACID: Uric Acid, Serum: 5.7 mg/dL (ref 2.4–7.0)

## 2024-03-28 MED ORDER — AMMONIUM LACTATE 12 % EX LOTN: 1.0000 | TOPICAL_LOTION | CUTANEOUS | 0 refills | Status: AC | PRN

## 2024-03-28 MED ORDER — LEVOTHYROXINE SODIUM 137 MCG PO TABS: 137.0000 ug | ORAL_TABLET | Freq: Every day | ORAL | 3 refills | Status: AC

## 2024-03-28 MED ORDER — ALPRAZOLAM 0.25 MG PO TABS: 0.2500 mg | ORAL_TABLET | Freq: Two times a day (BID) | ORAL | 2 refills | Status: AC | PRN

## 2024-03-28 NOTE — Assessment & Plan Note (Signed)
 Blood pressure was elevated initially at 146/72 mmHg with improvement noted on recheck. Adequately controlled with Losartan  and Lasix . There is no chest pain or shortness of breath. Continue losartan  as prescribed and ensure adherence to the Lasix  regimen. Refills sent.

## 2024-03-28 NOTE — Progress Notes (Signed)
 " Dawn Glance, NP-C Phone: [phone]  [name] is a 74 y.o. adult who presents today for follow up.   Discussed the use of AI scribe software for clinical note transcription with the patient, who gave verbal consent to proceed.  History of Present Illness   [name] is a 74 year old female with a history of strokes who presents for follow-up on her mobility and medication management.  She experienced strokes in 2019, which significantly impacted her mobility. She was unable to walk for a long time but has been using a walking stick for the past three to six months. She is doing well with the stick at home and has not experienced any falls recently. This visit marks her first time out walking with the walking stick.  Her blood sugar levels are well-controlled, with average blood sugars ranging from 110 to 130 mg/dL. She experiences occasional hypoglycemia but notes improvement since adjusting her medication regimen, which includes Mounjaro  weekly and Jardiance . She also reports improvement in yeast and urinary symptoms, although she still experiences yeast in her groin area.  She is on losartan  for hypertension management and reports no chest pain or shortness of breath. She has a history of atrial fibrillation and reports that she is always in AFib, according to her cardiologist. She takes her thyroid  medication, levothyroxine , daily.  She experiences significant xerosis in her feet, particularly her toes, and uses a lotion prescribed by a podiatrist to manage this. She has had difficulty attending podiatry appointments due to her husband's condition but plans to reschedule soon.  She reports mood improvement and continues to take Cymbalta , Wellbutrin , and Xanax  as needed, approximately once every couple of weeks. She manages her husband's care and household responsibilities with the help of family and neighbors.  She has been experiencing pain in her finger, which she suspects  may be arthritis. She initially tried colchicine  without relief and switched to Aleve , which has reduced the swelling.      Tobacco Use History[1]  Current Outpatient Medications on File Prior to Visit  Medication Sig Dispense Refill   acetaminophen  (TYLENOL ) 500 MG tablet Take 2 tablets (1,000 mg total) by mouth every 8 (eight) hours. 30 tablet 0   albuterol  (VENTOLIN  HFA) 108 (90 Base) MCG/ACT inhaler Inhale 2 puffs into the lungs every 6 (six) hours as needed. 8 g 4   buPROPion  (WELLBUTRIN  XL) 150 MG 24 hr tablet Take 1 tablet (150 mg total) by mouth daily. 90 tablet 3   calcium -vitamin D  (OSCAL WITH D) 500-200 MG-UNIT tablet Take 1 tablet by mouth 2 (two) times daily with a meal. 60 tablet 0   clobetasol  cream (TEMOVATE ) 0.05 % Apply 1 application topically 2 (two) times daily as needed (skin irritation).      colchicine  0.6 MG tablet Take 1 tablet (0.6 mg total) by mouth daily. Repeat I 1 hour if needed. No more than 3 tablets in 24 hours PRN for GOUT FLARE 30 tablet 0   Continuous Glucose Receiver (FREESTYLE LIBRE 2 READER) DEVI by Does not apply route.     Continuous Glucose Sensor (FREESTYLE LIBRE 3 PLUS SENSOR) MISC PLACE ONE SENSOR ON THE SKIN EVERY FIFTEEN DAYS.USE TO CHECK GLUCOSE CONTINUOUSLY 6 each 3   CRANBERRY PO Take by mouth daily.     cyanocobalamin  (VITAMIN B12) 1000 MCG tablet Take 1,000 mcg by mouth daily.     diclofenac  Sodium (VOLTAREN ) 1 % GEL Apply 2 g topically 3 (three) times daily. 100 g  2   DULoxetine  (CYMBALTA ) 60 MG capsule Take 1 capsule (60 mg total) by mouth daily. 90 capsule 3   ELIQUIS  5 MG TABS tablet TAKE 1 TABLET BY MOUTH TWICE DAILY 180 tablet 1   empagliflozin  (JARDIANCE ) 10 MG TABS tablet Take 1 tablet (10 mg total) by mouth daily before breakfast. 90 tablet 3   ergocalciferol  (VITAMIN D2) 1.25 MG (50000 UT) capsule Take 50,000 Units by mouth once a week.     estradiol  (ESTRACE ) 0.01 % CREA vaginal cream APPLY 0.5 MG (PEA-SIZED AMOUNT) JUST INSIDE THE  VAGINAL INTROITUS WITH A FINGER-TIP DAILY AT BEDTIME 42.5 g 5   ferrous sulfate  324 MG TBEC Take 324 mg by mouth daily with breakfast.     Fluticasone -Umeclidin-Vilant (TRELEGY ELLIPTA ) 200-62.5-25 MCG/ACT AEPB INHALE ONE PUFF INTO THE LUNGS EVERY DAY 60 each 5   folic acid  (FOLVITE ) 800 MCG tablet Take 800 mcg by mouth daily.     furosemide  (LASIX ) 40 MG tablet TAKE 1 TABLET BY MOUTH ONCE DAILY. MAY TAKE ADDITIONAL TABLET IF WEIGHT GAIN OFGREATER THAN 2 POUNDS IN 24 HOURS OR 5 POUNDS IN ONE WEEK 90 tablet 2   glucose blood (ONETOUCH ULTRA) test strip TEST TWICE DAILY AS DIRECTED 100 each 11   ipratropium-albuterol  (DUONEB) 0.5-2.5 (3) MG/3ML SOLN Take 3 mLs by nebulization every 6 (six) hours as needed.     levocetirizine (XYZAL ) 5 MG tablet TAKE ONE TABLET BY MOUTH AT BEDTIME AS NEEDED FOR ALLERGIES 90 tablet 3   Loperamide -Simethicone  2-125 MG TABS Take 1 tablet by mouth daily as needed. May repeat in 1 hour     losartan  (COZAAR ) 25 MG tablet Take 1 tablet (25 mg total) by mouth daily. 90 tablet 3   Magnesium  ([employer] TRIPLE MAGNESIUM  COMPLEX) 400 MG CAPS Take 1 capsule by mouth daily.     methenamine  (HIPREX ) 1 g tablet Take 1 tablet (1 g total) by mouth 2 (two) times daily with a meal. 60 tablet 11   Misc. Devices (TUB TRANSFER BOARD) MISC 1 Device by Does not apply route as needed. 1 each 0   montelukast  (SINGULAIR ) 10 MG tablet TAKE 1 TABLET BY MOUTH NIGHTLY 90 tablet 3   MOUNJARO  15 MG/0.5ML Pen INJECT THE CONTENTS OF ONE PEN UNDER THE SKIN WEEKLY ON THE SAME DAY EACH WEEK 2 mL 1   Multiple Vitamins-Minerals (HAIR SKIN & NAILS) TABS Take 1 tablet by mouth daily.     nystatin  powder Apply 1 Application topically 3 (three) times daily as needed. 60 g 5   ondansetron  (ZOFRAN ) 4 MG tablet Take 1 tablet (4 mg total) by mouth every 8 (eight) hours as needed for nausea or vomiting. 90 tablet 1   pantoprazole  (PROTONIX ) 40 MG tablet Take 1 tablet (40 mg total) by mouth daily. 30 min before food 90  tablet 3   pregabalin  (LYRICA ) 75 MG capsule TAKE ONE CAPSULE BY MOUTH THREE TIMES DAILY 90 capsule 5   rosuvastatin  (CRESTOR ) 20 MG tablet TAKE 1 TABLET BY MOUTH DAILY 90 tablet 0   senna-docusate (SENOKOT-S) 8.6-50 MG tablet Take 1 tablet by mouth at bedtime as needed for mild constipation. 10 tablet 0   Vibegron  (GEMTESA ) 75 MG TABS Take 1 tablet (75 mg total) by mouth daily. 30 tablet 11   No current facility-administered medications on file prior to visit.     ROS see history of present illness  Objective  Physical Exam Vitals:   03/28/24 1139 03/28/24 1215  BP: (!) 146/72 132/78  Pulse:  62   Temp: 98.4 F (36.9 C)   SpO2: 99%     BP Readings from Last 3 Encounters:  03/28/24 132/78  12/29/23 132/85  12/10/23 121/76   Wt Readings from Last 3 Encounters:  03/28/24 207 lb 6.4 oz (94.1 kg)  02/28/24 208 lb (94.3 kg)  12/10/23 223 lb (101.2 kg)    Physical Exam Constitutional:      General: She is not in acute distress.    Appearance: Normal appearance.  HENT:     Head: Normocephalic.  Cardiovascular:     Rate and Rhythm: Normal rate and regular rhythm.     Heart sounds: Normal heart sounds.  Pulmonary:     Effort: Pulmonary effort is normal.     Breath sounds: Normal breath sounds.  Skin:    General: Skin is warm and dry.  Neurological:     General: No focal deficit present.     Mental Status: She is alert.  Psychiatric:        Mood and Affect: Mood normal.        Behavior: Behavior normal.      Assessment/Plan: Please see individual problem list.  Type 2 diabetes mellitus with hyperglycemia, without long-term current use of insulin  (HCC) Assessment & Plan: Her diabetes is well-controlled with blood glucose levels averaging 110-130 mg/dL. There is no significant hypoglycemia, and yeast infections have improved with glycemic control. Continue Mounjaro  weekly and Jardiance  as prescribed. Check A1c today. Encourage healthy diet.   Orders: -      Hemoglobin A1c  Essential hypertension Assessment & Plan: Blood pressure was elevated initially at 146/72 mmHg with improvement noted on recheck. Adequately controlled with Losartan  and Lasix . There is no chest pain or shortness of breath. Continue losartan  as prescribed and ensure adherence to the Lasix  regimen. Refills sent.   Orders: -     Comprehensive metabolic panel with GFR  Hx of gout Assessment & Plan: She experiences pain and swelling in right middle finger. Aleve  provides relief, but there is concern about kidney function. She took colchicine  for two days without relief due to concern for gout. The presentation is consistent with arthritis, and gout is less likely. Check uric acid level. Continue Aleve  with caution due to kidney function.  Orders: -     Uric acid  Stage 3b chronic kidney disease (HCC) Assessment & Plan: She has stage 3b CKD and is advised to use caution with NSAIDs due to renal impact. Continue to monitor kidney function regularly. Check CMP today.   Orders: -     Comprehensive metabolic panel with GFR  Hypothyroidism, unspecified type Assessment & Plan: Her hypothyroidism is managed with daily levothyroxine . Continue levothyroxine  as prescribed. Check TSH.   Orders: -     TSH -     Levothyroxine  Sodium; Take 1 tablet (137 mcg total) by mouth daily before breakfast.  Dispense: 90 tablet; Refill: 3  Anxiety and depression Assessment & Plan: Depression and anxiety are well-managed with Cymbalta  and Wellbutrin , and Xanax  is used infrequently as needed. Continue Cymbalta  and Wellbutrin  as prescribed and use Xanax  as needed for anxiety. PDMP reviewed. Refills sent. Encouraged to contact if worsening symptoms, unusual behavior changes or suicidal thoughts occur.   Orders: -     ALPRAZolam ; Take 1 tablet (0.25 mg total) by mouth 2 (two) times daily as needed for anxiety. See SIG change was taking 1/2 of 0.5 tablet bid prn changed to 0.25 bid prn. D/c 0.5 xanax    Dispense: 60 tablet;  Refill: 2  Chronic atrial fibrillation Assessment & Plan: Chronic AFib is present with no recent symptom changes. Managed with Eliquis  twice daily. Continue. Follow up with Cardiology as scheduled.    Other orders -     Ammonium Lactate ; Apply 1 Application topically as needed.  Dispense: 400 g; Refill: 0     Return in about 4 months (around 07/26/2024) for Follow up.   Dawn Glance, NP-C [facility] [place]      [1]  Social History Tobacco Use  Smoking Status Never  Smokeless Tobacco Never   "

## 2024-03-28 NOTE — Assessment & Plan Note (Signed)
 Her diabetes is well-controlled with blood glucose levels averaging 110-130 mg/dL. There is no significant hypoglycemia, and yeast infections have improved with glycemic control. Continue Mounjaro  weekly and Jardiance  as prescribed. Check A1c today. Encourage healthy diet.

## 2024-03-28 NOTE — Assessment & Plan Note (Signed)
 She experiences pain and swelling in right middle finger. Aleve  provides relief, but there is concern about kidney function. She took colchicine  for two days without relief due to concern for gout. The presentation is consistent with arthritis, and gout is less likely. Check uric acid level. Continue Aleve  with caution due to kidney function.

## 2024-03-28 NOTE — Assessment & Plan Note (Signed)
 She has stage 3b CKD and is advised to use caution with NSAIDs due to renal impact. Continue to monitor kidney function regularly. Check CMP today.

## 2024-03-28 NOTE — Assessment & Plan Note (Signed)
 Her hypothyroidism is managed with daily levothyroxine . Continue levothyroxine  as prescribed. Check TSH.

## 2024-03-28 NOTE — Assessment & Plan Note (Addendum)
 Depression and anxiety are well-managed with Cymbalta  and Wellbutrin , and Xanax  is used infrequently as needed. Continue Cymbalta  and Wellbutrin  as prescribed and use Xanax  as needed for anxiety. PDMP reviewed. Refills sent. Encouraged to contact if worsening symptoms, unusual behavior changes or suicidal thoughts occur.

## 2024-03-28 NOTE — Assessment & Plan Note (Signed)
 Chronic AFib is present with no recent symptom changes. Managed with Eliquis  twice daily. Continue. Follow up with Cardiology as scheduled.

## 2024-03-29 ENCOUNTER — Encounter: Payer: Self-pay | Admitting: Oncology

## 2024-03-29 ENCOUNTER — Ambulatory Visit: Payer: Self-pay | Admitting: Nurse Practitioner

## 2024-04-03 ENCOUNTER — Encounter: Payer: Self-pay | Admitting: *Deleted

## 2024-04-11 ENCOUNTER — Other Ambulatory Visit: Payer: Self-pay | Admitting: Nurse Practitioner

## 2024-04-11 DIAGNOSIS — E1165 Type 2 diabetes mellitus with hyperglycemia: Secondary | ICD-10-CM

## 2024-04-13 ENCOUNTER — Telehealth: Payer: Self-pay

## 2024-04-13 ENCOUNTER — Ambulatory Visit: Admitting: Nurse Practitioner

## 2024-04-13 ENCOUNTER — Encounter: Payer: Self-pay | Admitting: Nurse Practitioner

## 2024-04-13 ENCOUNTER — Ambulatory Visit: Payer: Self-pay

## 2024-04-13 VITALS — BP 119/80 | Temp 97.8°F | Ht 64.0 in

## 2024-04-13 DIAGNOSIS — L03115 Cellulitis of right lower limb: Secondary | ICD-10-CM

## 2024-04-13 MED ORDER — DOXYCYCLINE HYCLATE 100 MG PO TABS: 100.0000 mg | ORAL_TABLET | Freq: Two times a day (BID) | ORAL | 0 refills | Status: AC

## 2024-04-13 NOTE — Telephone Encounter (Signed)
 Copied from CRM #[id number]. Topic: General - Running Late >> Apr 13, 2024  2:58 PM Pinkey ORN wrote: Patient/patient representative is calling because they are running late for an appointment. >> Apr 13, 2024  3:00 PM Pinkey ORN wrote: Chyrl husband called to inform office staff that they're en route to patient's appointment. States there's a little bit of traffic but they'll be there during the 10 minute grace window. Also requesting for someone to bring down a wheelchair for the patient, states they'll be in a blue Honda.

## 2024-04-13 NOTE — Telephone Encounter (Signed)
 noted

## 2024-04-13 NOTE — Progress Notes (Signed)
 " Leron Glance, NP-C Phone: [phone]  [name] is a 74 y.o. adult who presents today for leg swelling.   Patient reports increased right leg swelling and erythema for 2 days. The swelling goes up to her knee. She has been wearing her compression hose without improvement. She does have a history of DVT and cellulitis. She denies any fevers. Denies chest pain and shortness of breath. She does not have any pain with walking or pain in her calf. Her leg is tender.   See picture below.   Tobacco Use History[1]  Medications Ordered Prior to Encounter[2]   ROS see history of present illness  Objective  Physical Exam Vitals:   04/13/24 1554  BP: 119/80  Temp: 97.8 F (36.6 C)    BP Readings from Last 3 Encounters:  04/13/24 119/80  03/28/24 132/78  12/29/23 132/85   Wt Readings from Last 3 Encounters:  03/28/24 207 lb 6.4 oz (94.1 kg)  02/28/24 208 lb (94.3 kg)  12/10/23 223 lb (101.2 kg)    Physical Exam Constitutional:      General: She is not in acute distress.    Appearance: Normal appearance.  HENT:     Head: Normocephalic.  Cardiovascular:     Rate and Rhythm: Normal rate and regular rhythm.     Heart sounds: Normal heart sounds.  Pulmonary:     Effort: Pulmonary effort is normal.     Breath sounds: Normal breath sounds.  Musculoskeletal:     Right lower leg: Tenderness present. 2+ Edema present.     Comments: Erythema and warmth present. See pic below  Skin:    General: Skin is warm and dry.  Neurological:     General: No focal deficit present.     Mental Status: She is alert.  Psychiatric:        Mood and Affect: Mood normal.        Behavior: Behavior normal.      Assessment/Plan: Please see individual problem list.  Cellulitis of right lower extremity Assessment & Plan: Symptoms and exam consistent with cellulitis. We will start patient on Doxycyline 100 mg twice daily. She can continue her compression hose. Education handout provided. We  will plan to check on her in one week. Strict return precautions given to patient.  Orders: -     Doxycycline  Hyclate; Take 1 tablet (100 mg total) by mouth 2 (two) times daily.  Dispense: 20 tablet; Refill: 0    Return if symptoms worsen or fail to improve.   Leron Glance, NP-C [facility] [place]     [1]  Social History Tobacco Use  Smoking Status Never  Smokeless Tobacco Never  [2]  Current Outpatient Medications on File Prior to Visit  Medication Sig Dispense Refill   acetaminophen  (TYLENOL ) 500 MG tablet Take 2 tablets (1,000 mg total) by mouth every 8 (eight) hours. 30 tablet 0   albuterol  (VENTOLIN  HFA) 108 (90 Base) MCG/ACT inhaler Inhale 2 puffs into the lungs every 6 (six) hours as needed. 8 g 4   ALPRAZolam  (XANAX ) 0.25 MG tablet Take 1 tablet (0.25 mg total) by mouth 2 (two) times daily as needed for anxiety. See SIG change was taking 1/2 of 0.5 tablet bid prn changed to 0.25 bid prn. D/c 0.5 xanax  60 tablet 2   ammonium lactate  (AMLACTIN DAILY) 12 % lotion Apply 1 Application topically as needed. 400 g 0   buPROPion  (WELLBUTRIN  XL) 150 MG 24 hr tablet Take 1 tablet (  150 mg total) by mouth daily. 90 tablet 3   calcium -vitamin D  (OSCAL WITH D) 500-200 MG-UNIT tablet Take 1 tablet by mouth 2 (two) times daily with a meal. 60 tablet 0   clobetasol  cream (TEMOVATE ) 0.05 % Apply 1 application topically 2 (two) times daily as needed (skin irritation).      colchicine  0.6 MG tablet Take 1 tablet (0.6 mg total) by mouth daily. Repeat I 1 hour if needed. No more than 3 tablets in 24 hours PRN for GOUT FLARE 30 tablet 0   Continuous Glucose Receiver (FREESTYLE LIBRE 2 READER) DEVI by Does not apply route.     Continuous Glucose Sensor (FREESTYLE LIBRE 3 PLUS SENSOR) MISC PLACE ONE SENSOR ON THE SKIN EVERY FIFTEEN DAYS.USE TO CHECK GLUCOSE CONTINUOUSLY 6 each 3   CRANBERRY PO Take by mouth daily.     cyanocobalamin  (VITAMIN B12) 1000 MCG tablet Take 1,000 mcg  by mouth daily.     diclofenac  Sodium (VOLTAREN ) 1 % GEL Apply 2 g topically 3 (three) times daily. 100 g 2   DULoxetine  (CYMBALTA ) 60 MG capsule Take 1 capsule (60 mg total) by mouth daily. 90 capsule 3   ELIQUIS  5 MG TABS tablet TAKE 1 TABLET BY MOUTH TWICE DAILY 180 tablet 1   empagliflozin  (JARDIANCE ) 10 MG TABS tablet Take 1 tablet (10 mg total) by mouth daily before breakfast. 90 tablet 3   ergocalciferol  (VITAMIN D2) 1.25 MG (50000 UT) capsule Take 50,000 Units by mouth once a week.     estradiol  (ESTRACE ) 0.01 % CREA vaginal cream APPLY 0.5 MG (PEA-SIZED AMOUNT) JUST INSIDE THE VAGINAL INTROITUS WITH A FINGER-TIP DAILY AT BEDTIME 42.5 g 5   ferrous sulfate  324 MG TBEC Take 324 mg by mouth daily with breakfast.     Fluticasone -Umeclidin-Vilant (TRELEGY ELLIPTA ) 200-62.5-25 MCG/ACT AEPB INHALE ONE PUFF INTO THE LUNGS EVERY DAY 60 each 5   folic acid  (FOLVITE ) 800 MCG tablet Take 800 mcg by mouth daily.     furosemide  (LASIX ) 40 MG tablet TAKE 1 TABLET BY MOUTH ONCE DAILY. MAY TAKE ADDITIONAL TABLET IF WEIGHT GAIN OFGREATER THAN 2 POUNDS IN 24 HOURS OR 5 POUNDS IN ONE WEEK 90 tablet 2   glucose blood (ONETOUCH ULTRA) test strip TEST TWICE DAILY AS DIRECTED 100 each 11   ipratropium-albuterol  (DUONEB) 0.5-2.5 (3) MG/3ML SOLN Take 3 mLs by nebulization every 6 (six) hours as needed.     levocetirizine (XYZAL ) 5 MG tablet TAKE ONE TABLET BY MOUTH AT BEDTIME AS NEEDED FOR ALLERGIES 90 tablet 3   levothyroxine  (SYNTHROID ) 137 MCG tablet Take 1 tablet (137 mcg total) by mouth daily before breakfast. 90 tablet 3   Loperamide -Simethicone  2-125 MG TABS Take 1 tablet by mouth daily as needed. May repeat in 1 hour     losartan  (COZAAR ) 25 MG tablet Take 1 tablet (25 mg total) by mouth daily. 90 tablet 3   Magnesium  ([employer]S TRIPLE MAGNESIUM  COMPLEX) 400 MG CAPS Take 1 capsule by mouth daily.     methenamine  (HIPREX ) 1 g tablet Take 1 tablet (1 g total) by mouth 2 (two) times daily with a meal. 60 tablet 11    Misc. Devices (TUB TRANSFER BOARD) MISC 1 Device by Does not apply route as needed. 1 each 0   montelukast  (SINGULAIR ) 10 MG tablet TAKE 1 TABLET BY MOUTH NIGHTLY 90 tablet 3   MOUNJARO  15 MG/0.5ML Pen INJECT THE CONTENTS OF ONE PEN UNDER THE SKIN WEEKLY ON THE SAME DAY EACH WEEK 6 mL  1   Multiple Vitamins-Minerals (HAIR SKIN & NAILS) TABS Take 1 tablet by mouth daily.     nystatin  powder Apply 1 Application topically 3 (three) times daily as needed. 60 g 5   ondansetron  (ZOFRAN ) 4 MG tablet Take 1 tablet (4 mg total) by mouth every 8 (eight) hours as needed for nausea or vomiting. 90 tablet 1   pantoprazole  (PROTONIX ) 40 MG tablet Take 1 tablet (40 mg total) by mouth daily. 30 min before food 90 tablet 3   pregabalin  (LYRICA ) 75 MG capsule TAKE ONE CAPSULE BY MOUTH THREE TIMES DAILY 90 capsule 5   rosuvastatin  (CRESTOR ) 20 MG tablet TAKE 1 TABLET BY MOUTH DAILY 90 tablet 0   senna-docusate (SENOKOT-S) 8.6-50 MG tablet Take 1 tablet by mouth at bedtime as needed for mild constipation. 10 tablet 0   Vibegron  (GEMTESA ) 75 MG TABS Take 1 tablet (75 mg total) by mouth daily. 30 tablet 11   No current facility-administered medications on file prior to visit.   "

## 2024-04-13 NOTE — Telephone Encounter (Signed)
 FYI Only or Action Required?: Action required by provider: request for appointment.  Patient was last seen in primary care on 03/28/2024 by Dawn App, NP.  Called Nurse Triage reporting Leg Swelling.  Symptoms began several days ago.  Interventions attempted: Rest, hydration, or home remedies.  Symptoms are: unchanged.Right leg swelling up to knee. Wears compression hose. History of DVT, cellulitis.Also has head congestion, sore throat.  Triage Disposition: See Physician Within 24 Hours  Patient/caregiver understands and will follow disposition?: Yes       Reason for Triage: Runny nose for a month- congestion- right leg is swollen up to knee and hot to the touch- has had cellulitis in the past    Reason for Disposition  [1] MODERATE leg swelling (e.g., swelling extends up to knees) AND [2] new-onset or getting worse  Answer Assessment - Initial Assessment Questions 1. ONSET: When did the swelling start? (e.g., minutes, hours, days)     2 days 2. LOCATION: What part of the leg is swollen?  Are both legs swollen or just one leg?     Right leg, up to knee 3. SEVERITY: How bad is the swelling? (e.g., localized; mild, moderate, severe)     moderate 4. REDNESS: Is there redness or signs of infection?     no 5. PAIN: Is the swelling painful to touch? If Yes, ask: How painful is it?   (Scale 1-10; mild, moderate or severe)     moderate 6. FEVER: Do you have a fever? If Yes, ask: What is it, how was it measured, and when did it start?      no 7. CAUSE: What do you think is causing the leg swelling?     Unsure, has had cellulitis  8. MEDICAL HISTORY: Do you have a history of blood clots (e.g., DVT), cancer, heart failure, kidney disease, or liver failure?     no 9. RECURRENT SYMPTOM: Have you had leg swelling before? If Yes, ask: When was the last time? What happened that time?     yes 10. OTHER SYMPTOMS: Do you have any other symptoms? (e.g., chest  pain, difficulty breathing)       no 11. PREGNANCY: Is there any chance you are pregnant? When was your last menstrual period?       no  Protocols used: Leg Swelling and Edema-A-AH

## 2024-04-18 ENCOUNTER — Ambulatory Visit: Payer: Self-pay

## 2024-04-18 NOTE — Telephone Encounter (Signed)
 Pt has difficulty coming to office d/t limited transportation and caring for her husband. Please advise via phone. States she is going to nap but can leave a detailed message on there voicemail.  FYI Only or Action Required?: Action required by provider: clinical question for provider.  Patient was last seen in primary care on 04/13/2024 by Gretel App, NP.  Called Nurse Triage reporting Foot Pain.  Symptoms began several days ago.  Interventions attempted: Prescription medications: lyrica , hydrocodone .  Symptoms are: unchanged - varies.  Triage Disposition: See PCP When Office is Open (Within 3 Days)  Patient/caregiver understands and will follow disposition?: No, wishes to speak with PCP  Reason for Disposition  [1] Tingling in feet AND [2] new or increased  Answer Assessment - Initial Assessment Questions Pt [name]/o right foot neuropathy symptoms worsening x 2 days. States is experiencing an occasional electric sensation in her right foot. States historically, she has gone to the pain clinic for pain management, but was dissatisfied at her last visit there. States she has discussed with PCP previously.   States historically, increasing her Lyrica  dose or frequency was effective. Also took one hydrocodone  5-325 that provided temporary improvement.   Immediate medical attention advised if pain worsens, if discoloration occurs that is different than her baseline, or if numbness occurs.  Note: on 04/13/24, pt was seen for right lower leg cellulitis and tx with doxycycline  100 mg BID. States began to improve 04/16/24. States redness has almost fully resolved other than one small area. Confirms she is wearing her compression stockings.   1. ONSET: When did the pain start?      2 days 2. LOCATION: Where is the pain located?      Right foot 3. PAIN: How bad is the pain?    (Scale 1-10; or mild, moderate, severe)     States hard to rate d/t varying in severity. States was worse  overnight, this morning improved with  5. CAUSE: What do you think is causing the foot pain?     Pt believes d/t neuropathy 6. OTHER SYMPTOMS: Do you have any other symptoms? (e.g., leg pain, rash, fever, numbness)     Denies  Protocols used: Foot Pain-A-AH  Copied from CRM [id number]. Topic: Clinical - Medical Advice >> Apr 18, 2024  1:02 PM [name] wrote: Reason for CRM: pt called and stated that she has been having neuropathy on the top and side of her foot. She wanted to inform App for her suggestion on what she needs to do. She mentioned maybe increasing her Lyrica  medicine. Please call patient and advise.

## 2024-04-27 ENCOUNTER — Encounter: Payer: Self-pay | Admitting: Nurse Practitioner

## 2024-04-27 NOTE — Telephone Encounter (Signed)
 Detailed vm left informing pt

## 2024-04-27 NOTE — Assessment & Plan Note (Signed)
 Symptoms and exam consistent with cellulitis. We will start patient on Doxycyline 100 mg twice daily. She can continue her compression hose. Education handout provided. We will plan to check on her in one week. Strict return precautions given to patient.

## 2024-06-05 ENCOUNTER — Ambulatory Visit: Admitting: Medical

## 2024-06-09 ENCOUNTER — Other Ambulatory Visit

## 2024-07-26 ENCOUNTER — Ambulatory Visit: Admitting: Nurse Practitioner

## 2024-12-11 ENCOUNTER — Ambulatory Visit: Admitting: Oncology

## 2024-12-11 ENCOUNTER — Other Ambulatory Visit

## 2025-03-06 ENCOUNTER — Ambulatory Visit
